# Patient Record
Sex: Male | Born: 1955
Health system: Southern US, Community
[De-identification: ages and names within clinical notes are randomized; demographics above are authoritative.]

## PROBLEM LIST (undated history)

## (undated) DIAGNOSIS — D649 Anemia, unspecified: Secondary | ICD-10-CM

## (undated) DIAGNOSIS — Z950 Presence of cardiac pacemaker: Secondary | ICD-10-CM

## (undated) DIAGNOSIS — E785 Hyperlipidemia, unspecified: Secondary | ICD-10-CM

## (undated) DIAGNOSIS — I10 Essential (primary) hypertension: Secondary | ICD-10-CM

## (undated) DIAGNOSIS — K5792 Diverticulitis of intestine, part unspecified, without perforation or abscess without bleeding: Secondary | ICD-10-CM

## (undated) DIAGNOSIS — I429 Cardiomyopathy, unspecified: Secondary | ICD-10-CM

## (undated) DIAGNOSIS — R21 Rash and other nonspecific skin eruption: Secondary | ICD-10-CM

## (undated) DIAGNOSIS — I480 Paroxysmal atrial fibrillation: Secondary | ICD-10-CM

## (undated) DIAGNOSIS — K219 Gastro-esophageal reflux disease without esophagitis: Secondary | ICD-10-CM

## (undated) DIAGNOSIS — G4733 Obstructive sleep apnea (adult) (pediatric): Secondary | ICD-10-CM

## (undated) DIAGNOSIS — G4700 Insomnia, unspecified: Secondary | ICD-10-CM

## (undated) DIAGNOSIS — Z95810 Presence of automatic (implantable) cardiac defibrillator: Secondary | ICD-10-CM

## (undated) DIAGNOSIS — E119 Type 2 diabetes mellitus without complications: Secondary | ICD-10-CM

## (undated) DIAGNOSIS — K5909 Other constipation: Secondary | ICD-10-CM

## (undated) DIAGNOSIS — M1990 Unspecified osteoarthritis, unspecified site: Secondary | ICD-10-CM

## (undated) DIAGNOSIS — IMO0001 Reserved for inherently not codable concepts without codable children: Secondary | ICD-10-CM

## (undated) DIAGNOSIS — J189 Pneumonia, unspecified organism: Secondary | ICD-10-CM

## (undated) DIAGNOSIS — J45909 Unspecified asthma, uncomplicated: Secondary | ICD-10-CM

## (undated) DIAGNOSIS — Z9981 Dependence on supplemental oxygen: Secondary | ICD-10-CM

## (undated) DIAGNOSIS — J439 Emphysema, unspecified: Secondary | ICD-10-CM

## (undated) DIAGNOSIS — F411 Generalized anxiety disorder: Secondary | ICD-10-CM

## (undated) DIAGNOSIS — R55 Syncope and collapse: Secondary | ICD-10-CM

## (undated) DIAGNOSIS — G629 Polyneuropathy, unspecified: Secondary | ICD-10-CM

## (undated) DIAGNOSIS — F329 Major depressive disorder, single episode, unspecified: Secondary | ICD-10-CM

## (undated) DIAGNOSIS — I499 Cardiac arrhythmia, unspecified: Secondary | ICD-10-CM

## (undated) DIAGNOSIS — H409 Unspecified glaucoma: Secondary | ICD-10-CM

## (undated) DIAGNOSIS — J449 Chronic obstructive pulmonary disease, unspecified: Secondary | ICD-10-CM

## (undated) DIAGNOSIS — F3289 Other specified depressive episodes: Secondary | ICD-10-CM

## (undated) DIAGNOSIS — K224 Dyskinesia of esophagus: Secondary | ICD-10-CM

## (undated) DIAGNOSIS — I219 Acute myocardial infarction, unspecified: Secondary | ICD-10-CM

## (undated) DIAGNOSIS — I509 Heart failure, unspecified: Secondary | ICD-10-CM

## (undated) DIAGNOSIS — I4891 Unspecified atrial fibrillation: Secondary | ICD-10-CM

## (undated) HISTORY — DX: Gastro-esophageal reflux disease without esophagitis: K21.9

## (undated) HISTORY — PX: TONSILLECTOMY AND ADENOIDECTOMY: SUR1326

## (undated) HISTORY — DX: Paroxysmal atrial fibrillation: I48.0

## (undated) HISTORY — DX: Other specified depressive episodes: F32.89

## (undated) HISTORY — DX: Cardiomyopathy, unspecified: I42.9

## (undated) HISTORY — DX: Obstructive sleep apnea (adult) (pediatric): G47.33

## (undated) HISTORY — DX: Rash and other nonspecific skin eruption: R21

## (undated) HISTORY — DX: Unspecified atrial fibrillation: I48.91

## (undated) HISTORY — DX: Dyskinesia of esophagus: K22.4

## (undated) HISTORY — DX: Syncope and collapse: R55

## (undated) HISTORY — DX: Type 2 diabetes mellitus without complications: E11.9

## (undated) HISTORY — DX: Essential (primary) hypertension: I10

## (undated) HISTORY — DX: Hyperlipidemia, unspecified: E78.5

## (undated) HISTORY — DX: Reserved for inherently not codable concepts without codable children: IMO0001

## (undated) HISTORY — PX: JOINT REPLACEMENT: SHX530

## (undated) HISTORY — DX: Major depressive disorder, single episode, unspecified: F32.9

## (undated) HISTORY — DX: Insomnia, unspecified: G47.00

## (undated) HISTORY — PX: ESOPHAGOGASTRODUODENOSCOPY: SHX1529

## (undated) HISTORY — DX: Generalized anxiety disorder: F41.1

## (undated) HISTORY — PX: ACNE CYST REMOVAL: SUR1112

## (undated) HISTORY — PX: COLONOSCOPY: SHX174

---

## 2000-10-26 ENCOUNTER — Inpatient Hospital Stay (HOSPITAL_COMMUNITY): Admission: EM | Admit: 2000-10-26 | Discharge: 2000-10-27 | Payer: Self-pay | Admitting: *Deleted

## 2000-10-26 ENCOUNTER — Encounter: Payer: Self-pay | Admitting: Emergency Medicine

## 2004-06-10 ENCOUNTER — Encounter: Admission: RE | Admit: 2004-06-10 | Discharge: 2004-06-10 | Payer: Self-pay | Admitting: Internal Medicine

## 2004-06-30 ENCOUNTER — Ambulatory Visit (HOSPITAL_COMMUNITY): Admission: RE | Admit: 2004-06-30 | Discharge: 2004-06-30 | Payer: Self-pay | Admitting: General Surgery

## 2004-06-30 ENCOUNTER — Encounter (INDEPENDENT_AMBULATORY_CARE_PROVIDER_SITE_OTHER): Payer: Self-pay | Admitting: Specialist

## 2004-06-30 ENCOUNTER — Ambulatory Visit (HOSPITAL_BASED_OUTPATIENT_CLINIC_OR_DEPARTMENT_OTHER): Admission: RE | Admit: 2004-06-30 | Discharge: 2004-06-30 | Payer: Self-pay | Admitting: General Surgery

## 2004-10-03 ENCOUNTER — Ambulatory Visit: Payer: Self-pay | Admitting: Internal Medicine

## 2005-04-10 ENCOUNTER — Ambulatory Visit: Payer: Self-pay | Admitting: Internal Medicine

## 2005-04-13 ENCOUNTER — Ambulatory Visit: Payer: Self-pay | Admitting: Internal Medicine

## 2005-04-20 ENCOUNTER — Ambulatory Visit: Payer: Self-pay | Admitting: Internal Medicine

## 2005-04-23 ENCOUNTER — Ambulatory Visit: Payer: Self-pay | Admitting: Cardiology

## 2005-04-24 ENCOUNTER — Ambulatory Visit: Payer: Self-pay | Admitting: Gastroenterology

## 2005-04-25 ENCOUNTER — Encounter (INDEPENDENT_AMBULATORY_CARE_PROVIDER_SITE_OTHER): Payer: Self-pay | Admitting: *Deleted

## 2005-04-25 ENCOUNTER — Ambulatory Visit: Payer: Self-pay | Admitting: Gastroenterology

## 2005-05-16 ENCOUNTER — Ambulatory Visit: Payer: Self-pay | Admitting: Gastroenterology

## 2005-11-13 ENCOUNTER — Ambulatory Visit: Payer: Self-pay | Admitting: Internal Medicine

## 2006-05-15 ENCOUNTER — Ambulatory Visit: Payer: Self-pay | Admitting: Internal Medicine

## 2006-05-15 LAB — CONVERTED CEMR LAB: PSA: 0.8 ng/mL

## 2006-06-04 ENCOUNTER — Ambulatory Visit: Payer: Self-pay | Admitting: Internal Medicine

## 2006-09-16 ENCOUNTER — Ambulatory Visit: Payer: Self-pay | Admitting: Internal Medicine

## 2006-12-18 ENCOUNTER — Ambulatory Visit: Payer: Self-pay | Admitting: Internal Medicine

## 2007-06-03 ENCOUNTER — Encounter: Payer: Self-pay | Admitting: Internal Medicine

## 2007-06-03 DIAGNOSIS — F32A Depression, unspecified: Secondary | ICD-10-CM | POA: Insufficient documentation

## 2007-06-03 DIAGNOSIS — M722 Plantar fascial fibromatosis: Secondary | ICD-10-CM | POA: Insufficient documentation

## 2007-06-03 DIAGNOSIS — I10 Essential (primary) hypertension: Secondary | ICD-10-CM | POA: Insufficient documentation

## 2007-06-03 DIAGNOSIS — E785 Hyperlipidemia, unspecified: Secondary | ICD-10-CM | POA: Insufficient documentation

## 2007-06-03 DIAGNOSIS — F329 Major depressive disorder, single episode, unspecified: Secondary | ICD-10-CM

## 2007-06-03 DIAGNOSIS — K224 Dyskinesia of esophagus: Secondary | ICD-10-CM | POA: Insufficient documentation

## 2007-06-03 DIAGNOSIS — G4700 Insomnia, unspecified: Secondary | ICD-10-CM | POA: Insufficient documentation

## 2007-06-09 DIAGNOSIS — M545 Low back pain, unspecified: Secondary | ICD-10-CM | POA: Insufficient documentation

## 2007-06-09 DIAGNOSIS — F419 Anxiety disorder, unspecified: Secondary | ICD-10-CM | POA: Insufficient documentation

## 2007-06-09 DIAGNOSIS — N400 Enlarged prostate without lower urinary tract symptoms: Secondary | ICD-10-CM | POA: Insufficient documentation

## 2007-06-09 DIAGNOSIS — K219 Gastro-esophageal reflux disease without esophagitis: Secondary | ICD-10-CM | POA: Insufficient documentation

## 2007-06-09 DIAGNOSIS — J309 Allergic rhinitis, unspecified: Secondary | ICD-10-CM | POA: Insufficient documentation

## 2007-06-09 DIAGNOSIS — E119 Type 2 diabetes mellitus without complications: Secondary | ICD-10-CM | POA: Insufficient documentation

## 2007-07-01 ENCOUNTER — Ambulatory Visit: Payer: Self-pay | Admitting: Internal Medicine

## 2007-07-01 LAB — CONVERTED CEMR LAB
Albumin: 3.9 g/dL (ref 3.5–5.2)
Alkaline Phosphatase: 59 units/L (ref 39–117)
Basophils Absolute: 0.1 10*3/uL (ref 0.0–0.1)
Bilirubin Urine: NEGATIVE
Direct LDL: 137.9 mg/dL
Eosinophils Absolute: 0.2 10*3/uL (ref 0.0–0.6)
HDL: 30.3 mg/dL — ABNORMAL LOW (ref >39.0)
Lymphocytes Relative: 35.5 % (ref 12.0–46.0)
MCHC: 34.8 g/dL (ref 30.0–36.0)
MCV: 91.9 fL (ref 78.0–100.0)
Monocytes Relative: 7.8 % (ref 3.0–11.0)
Neutro Abs: 5.2 10*3/uL (ref 1.4–7.7)
PSA: 0.65 ng/mL (ref 0.10–4.00)
Platelets: 213 10*3/uL (ref 150–400)
Pro B Natriuretic peptide (BNP): 43 pg/mL (ref 0.0–100.0)
Total CHOL/HDL Ratio: 7.2
Total Protein: 6.3 g/dL (ref 6.0–8.3)
Triglycerides: 363 mg/dL (ref 0–149)
Urine Glucose: NEGATIVE mg/dL

## 2007-07-11 ENCOUNTER — Ambulatory Visit: Payer: Self-pay | Admitting: Pulmonary Disease

## 2007-07-29 ENCOUNTER — Ambulatory Visit (HOSPITAL_BASED_OUTPATIENT_CLINIC_OR_DEPARTMENT_OTHER): Admission: RE | Admit: 2007-07-29 | Discharge: 2007-07-29 | Payer: Self-pay | Admitting: Pulmonary Disease

## 2007-07-29 ENCOUNTER — Encounter: Payer: Self-pay | Admitting: Pulmonary Disease

## 2007-08-05 ENCOUNTER — Ambulatory Visit: Payer: Self-pay | Admitting: Pulmonary Disease

## 2007-08-11 ENCOUNTER — Ambulatory Visit: Payer: Self-pay | Admitting: Pulmonary Disease

## 2007-08-26 ENCOUNTER — Ambulatory Visit: Payer: Self-pay | Admitting: Internal Medicine

## 2007-09-15 ENCOUNTER — Ambulatory Visit: Payer: Self-pay | Admitting: Pulmonary Disease

## 2007-10-17 ENCOUNTER — Encounter: Payer: Self-pay | Admitting: Pulmonary Disease

## 2007-10-21 ENCOUNTER — Ambulatory Visit: Payer: Self-pay | Admitting: Internal Medicine

## 2007-10-21 DIAGNOSIS — M2550 Pain in unspecified joint: Secondary | ICD-10-CM | POA: Insufficient documentation

## 2007-10-21 DIAGNOSIS — J45909 Unspecified asthma, uncomplicated: Secondary | ICD-10-CM | POA: Insufficient documentation

## 2007-10-21 DIAGNOSIS — G4733 Obstructive sleep apnea (adult) (pediatric): Secondary | ICD-10-CM | POA: Insufficient documentation

## 2007-10-21 DIAGNOSIS — R609 Edema, unspecified: Secondary | ICD-10-CM | POA: Insufficient documentation

## 2007-10-21 DIAGNOSIS — Z87442 Personal history of urinary calculi: Secondary | ICD-10-CM | POA: Insufficient documentation

## 2007-10-24 ENCOUNTER — Encounter: Payer: Self-pay | Admitting: Pulmonary Disease

## 2007-10-27 ENCOUNTER — Telehealth (INDEPENDENT_AMBULATORY_CARE_PROVIDER_SITE_OTHER): Payer: Self-pay | Admitting: *Deleted

## 2007-10-31 ENCOUNTER — Encounter: Payer: Self-pay | Admitting: Internal Medicine

## 2008-01-28 ENCOUNTER — Telehealth: Payer: Self-pay | Admitting: Internal Medicine

## 2008-03-01 ENCOUNTER — Telehealth: Payer: Self-pay | Admitting: Internal Medicine

## 2008-03-11 ENCOUNTER — Ambulatory Visit: Payer: Self-pay | Admitting: Internal Medicine

## 2008-03-11 ENCOUNTER — Telehealth (INDEPENDENT_AMBULATORY_CARE_PROVIDER_SITE_OTHER): Payer: Self-pay | Admitting: *Deleted

## 2008-03-11 DIAGNOSIS — IMO0001 Reserved for inherently not codable concepts without codable children: Secondary | ICD-10-CM | POA: Insufficient documentation

## 2008-03-24 ENCOUNTER — Encounter: Payer: Self-pay | Admitting: Internal Medicine

## 2008-03-31 ENCOUNTER — Ambulatory Visit: Payer: Self-pay | Admitting: Internal Medicine

## 2008-04-30 ENCOUNTER — Encounter: Payer: Self-pay | Admitting: Internal Medicine

## 2008-05-03 ENCOUNTER — Encounter: Payer: Self-pay | Admitting: Internal Medicine

## 2008-05-20 ENCOUNTER — Ambulatory Visit: Payer: Self-pay | Admitting: Gastroenterology

## 2008-05-21 ENCOUNTER — Telehealth: Payer: Self-pay | Admitting: Gastroenterology

## 2008-05-27 ENCOUNTER — Telehealth: Payer: Self-pay | Admitting: Internal Medicine

## 2008-05-31 ENCOUNTER — Telehealth (INDEPENDENT_AMBULATORY_CARE_PROVIDER_SITE_OTHER): Payer: Self-pay | Admitting: *Deleted

## 2008-06-01 ENCOUNTER — Ambulatory Visit: Payer: Self-pay | Admitting: Gastroenterology

## 2008-06-01 ENCOUNTER — Encounter: Payer: Self-pay | Admitting: Gastroenterology

## 2008-06-03 ENCOUNTER — Encounter: Payer: Self-pay | Admitting: Gastroenterology

## 2008-06-10 ENCOUNTER — Encounter: Payer: Self-pay | Admitting: Internal Medicine

## 2008-06-28 ENCOUNTER — Ambulatory Visit: Payer: Self-pay | Admitting: Internal Medicine

## 2008-06-28 DIAGNOSIS — R350 Frequency of micturition: Secondary | ICD-10-CM | POA: Insufficient documentation

## 2008-06-28 LAB — CONVERTED CEMR LAB
Bilirubin Urine: NEGATIVE
Blood Glucose, Fingerstick: 77
Ketones, ur: NEGATIVE mg/dL
Leukocytes, UA: NEGATIVE
Mucus, UA: NEGATIVE
pH: 6 (ref 5.0–8.0)

## 2008-06-29 ENCOUNTER — Encounter: Payer: Self-pay | Admitting: Internal Medicine

## 2008-07-08 ENCOUNTER — Telehealth (INDEPENDENT_AMBULATORY_CARE_PROVIDER_SITE_OTHER): Payer: Self-pay | Admitting: *Deleted

## 2008-07-27 ENCOUNTER — Ambulatory Visit: Payer: Self-pay | Admitting: Internal Medicine

## 2008-07-29 ENCOUNTER — Telehealth (INDEPENDENT_AMBULATORY_CARE_PROVIDER_SITE_OTHER): Payer: Self-pay | Admitting: *Deleted

## 2008-08-20 ENCOUNTER — Telehealth: Payer: Self-pay | Admitting: Internal Medicine

## 2008-09-07 ENCOUNTER — Telehealth (INDEPENDENT_AMBULATORY_CARE_PROVIDER_SITE_OTHER): Payer: Self-pay | Admitting: *Deleted

## 2008-10-22 ENCOUNTER — Telehealth (INDEPENDENT_AMBULATORY_CARE_PROVIDER_SITE_OTHER): Payer: Self-pay | Admitting: *Deleted

## 2008-10-26 ENCOUNTER — Telehealth: Payer: Self-pay | Admitting: Internal Medicine

## 2008-11-12 ENCOUNTER — Encounter: Payer: Self-pay | Admitting: Internal Medicine

## 2008-11-18 ENCOUNTER — Ambulatory Visit: Payer: Self-pay | Admitting: Internal Medicine

## 2008-11-18 DIAGNOSIS — N453 Epididymo-orchitis: Secondary | ICD-10-CM | POA: Insufficient documentation

## 2008-11-22 ENCOUNTER — Telehealth (INDEPENDENT_AMBULATORY_CARE_PROVIDER_SITE_OTHER): Payer: Self-pay | Admitting: *Deleted

## 2008-12-13 ENCOUNTER — Telehealth: Payer: Self-pay | Admitting: Internal Medicine

## 2008-12-30 ENCOUNTER — Telehealth (INDEPENDENT_AMBULATORY_CARE_PROVIDER_SITE_OTHER): Payer: Self-pay | Admitting: *Deleted

## 2009-02-01 ENCOUNTER — Telehealth: Payer: Self-pay | Admitting: Internal Medicine

## 2009-03-08 ENCOUNTER — Telehealth (INDEPENDENT_AMBULATORY_CARE_PROVIDER_SITE_OTHER): Payer: Self-pay | Admitting: *Deleted

## 2009-03-15 ENCOUNTER — Telehealth: Payer: Self-pay | Admitting: Internal Medicine

## 2009-03-30 ENCOUNTER — Telehealth: Payer: Self-pay | Admitting: Internal Medicine

## 2009-04-04 ENCOUNTER — Telehealth (INDEPENDENT_AMBULATORY_CARE_PROVIDER_SITE_OTHER): Payer: Self-pay | Admitting: *Deleted

## 2009-04-05 ENCOUNTER — Telehealth: Payer: Self-pay | Admitting: Internal Medicine

## 2009-04-12 ENCOUNTER — Ambulatory Visit: Payer: Self-pay | Admitting: Internal Medicine

## 2009-04-12 ENCOUNTER — Telehealth: Payer: Self-pay | Admitting: Internal Medicine

## 2009-04-12 DIAGNOSIS — R21 Rash and other nonspecific skin eruption: Secondary | ICD-10-CM

## 2009-04-12 HISTORY — DX: Rash and other nonspecific skin eruption: R21

## 2009-04-13 LAB — CONVERTED CEMR LAB
CO2: 32 meq/L (ref 19–32)
Calcium: 9.1 mg/dL (ref 8.4–10.5)
Cholesterol: 228 mg/dL — ABNORMAL HIGH (ref 0–200)
Creatinine, Ser: 0.8 mg/dL (ref 0.4–1.5)
GFR calc non Af Amer: 106.94 mL/min (ref >60)
HDL: 36.1 mg/dL — ABNORMAL LOW (ref >39.00)
Triglycerides: 175 mg/dL — ABNORMAL HIGH (ref 0.0–149.0)

## 2009-04-19 ENCOUNTER — Telehealth: Payer: Self-pay | Admitting: Internal Medicine

## 2009-04-21 ENCOUNTER — Telehealth: Payer: Self-pay | Admitting: Internal Medicine

## 2009-04-28 ENCOUNTER — Telehealth: Payer: Self-pay | Admitting: Internal Medicine

## 2009-05-31 ENCOUNTER — Telehealth: Payer: Self-pay | Admitting: Internal Medicine

## 2009-06-06 ENCOUNTER — Telehealth (INDEPENDENT_AMBULATORY_CARE_PROVIDER_SITE_OTHER): Payer: Self-pay | Admitting: *Deleted

## 2009-06-13 ENCOUNTER — Telehealth: Payer: Self-pay | Admitting: Internal Medicine

## 2009-06-21 ENCOUNTER — Telehealth: Payer: Self-pay | Admitting: Internal Medicine

## 2009-07-06 ENCOUNTER — Telehealth: Payer: Self-pay | Admitting: Internal Medicine

## 2009-07-25 ENCOUNTER — Telehealth: Payer: Self-pay | Admitting: Internal Medicine

## 2009-07-26 ENCOUNTER — Ambulatory Visit: Payer: Self-pay | Admitting: Internal Medicine

## 2009-08-03 ENCOUNTER — Telehealth: Payer: Self-pay | Admitting: Internal Medicine

## 2009-08-10 ENCOUNTER — Ambulatory Visit: Payer: Self-pay | Admitting: Internal Medicine

## 2009-08-12 ENCOUNTER — Encounter: Payer: Self-pay | Admitting: Internal Medicine

## 2009-08-31 ENCOUNTER — Telehealth: Payer: Self-pay | Admitting: Internal Medicine

## 2009-09-14 ENCOUNTER — Telehealth: Payer: Self-pay | Admitting: Internal Medicine

## 2009-09-15 ENCOUNTER — Telehealth: Payer: Self-pay | Admitting: Internal Medicine

## 2009-09-16 ENCOUNTER — Telehealth: Payer: Self-pay | Admitting: Internal Medicine

## 2009-09-30 ENCOUNTER — Telehealth: Payer: Self-pay | Admitting: Internal Medicine

## 2009-10-03 ENCOUNTER — Telehealth: Payer: Self-pay | Admitting: Internal Medicine

## 2009-10-18 ENCOUNTER — Encounter: Payer: Self-pay | Admitting: Internal Medicine

## 2009-10-18 ENCOUNTER — Ambulatory Visit: Payer: Self-pay | Admitting: Internal Medicine

## 2009-12-07 ENCOUNTER — Telehealth: Payer: Self-pay | Admitting: Internal Medicine

## 2009-12-12 ENCOUNTER — Telehealth: Payer: Self-pay | Admitting: Internal Medicine

## 2009-12-26 ENCOUNTER — Telehealth: Payer: Self-pay | Admitting: Internal Medicine

## 2010-02-21 ENCOUNTER — Ambulatory Visit: Payer: Self-pay | Admitting: Internal Medicine

## 2010-02-24 ENCOUNTER — Telehealth: Payer: Self-pay | Admitting: Internal Medicine

## 2010-03-09 ENCOUNTER — Telehealth: Payer: Self-pay | Admitting: Internal Medicine

## 2010-03-21 ENCOUNTER — Ambulatory Visit: Payer: Self-pay | Admitting: Internal Medicine

## 2010-03-21 LAB — CONVERTED CEMR LAB
AST: 19 units/L (ref 0–37)
Alkaline Phosphatase: 63 units/L (ref 39–117)
BUN: 11 mg/dL (ref 6–23)
Basophils Absolute: 0 10*3/uL (ref 0.0–0.1)
Bilirubin, Direct: 0.1 mg/dL (ref 0.0–0.3)
CO2: 29 meq/L (ref 19–32)
Chloride: 107 meq/L (ref 96–112)
Cholesterol: 238 mg/dL — ABNORMAL HIGH (ref 0–200)
Creatinine, Ser: 0.7 mg/dL (ref 0.4–1.5)
Creatinine,U: 97 mg/dL
Direct LDL: 164.8 mg/dL
Eosinophils Relative: 2.4 % (ref 0.0–5.0)
Glucose, Bld: 118 mg/dL — ABNORMAL HIGH (ref 70–99)
HCT: 42.6 % (ref 39.0–52.0)
Hemoglobin: 14.7 g/dL (ref 13.0–17.0)
Hgb A1c MFr Bld: 6.7 % — ABNORMAL HIGH (ref 4.6–6.5)
Ketones, ur: NEGATIVE mg/dL
Lymphocytes Relative: 30.9 % (ref 12.0–46.0)
Microalb, Ur: 0.7 mg/dL (ref 0.0–1.9)
Monocytes Relative: 8 % (ref 3.0–12.0)
Neutro Abs: 4.2 10*3/uL (ref 1.4–7.7)
Potassium: 4.6 meq/L (ref 3.5–5.1)
RBC: 4.56 M/uL (ref 4.22–5.81)
RDW: 12.6 % (ref 11.5–14.6)
Specific Gravity, Urine: 1.02 (ref 1.000–1.030)
TSH: 3.01 microintl units/mL (ref 0.35–5.50)
Total CHOL/HDL Ratio: 6
Total Protein, Urine: NEGATIVE mg/dL
Total Protein: 6.5 g/dL (ref 6.0–8.3)
Urine Glucose: NEGATIVE mg/dL
VLDL: 34.8 mg/dL (ref 0.0–40.0)
WBC: 7.2 10*3/uL (ref 4.5–10.5)

## 2010-03-29 ENCOUNTER — Ambulatory Visit: Payer: Self-pay | Admitting: Internal Medicine

## 2010-04-12 ENCOUNTER — Telehealth: Payer: Self-pay | Admitting: Internal Medicine

## 2010-05-15 ENCOUNTER — Telehealth: Payer: Self-pay | Admitting: Internal Medicine

## 2010-05-18 ENCOUNTER — Ambulatory Visit: Payer: Self-pay | Admitting: Internal Medicine

## 2010-05-18 DIAGNOSIS — G894 Chronic pain syndrome: Secondary | ICD-10-CM | POA: Insufficient documentation

## 2010-05-18 DIAGNOSIS — M25519 Pain in unspecified shoulder: Secondary | ICD-10-CM | POA: Insufficient documentation

## 2010-05-19 ENCOUNTER — Telehealth: Payer: Self-pay | Admitting: Internal Medicine

## 2010-07-07 ENCOUNTER — Ambulatory Visit: Payer: Self-pay | Admitting: Internal Medicine

## 2010-09-18 ENCOUNTER — Telehealth: Payer: Self-pay | Admitting: Internal Medicine

## 2010-09-20 ENCOUNTER — Telehealth: Payer: Self-pay | Admitting: Internal Medicine

## 2010-09-21 ENCOUNTER — Ambulatory Visit: Payer: Self-pay | Admitting: Internal Medicine

## 2010-09-26 LAB — CONVERTED CEMR LAB
BUN: 14 mg/dL (ref 6–23)
Chloride: 100 meq/L (ref 96–112)
Cholesterol: 214 mg/dL — ABNORMAL HIGH (ref 0–200)
Glucose, Bld: 84 mg/dL (ref 70–99)
HDL: 38.8 mg/dL — ABNORMAL LOW (ref >39.00)
Potassium: 4.5 meq/L (ref 3.5–5.1)
Total CHOL/HDL Ratio: 6
VLDL: 26.2 mg/dL (ref 0.0–40.0)

## 2010-09-28 ENCOUNTER — Ambulatory Visit: Payer: Self-pay | Admitting: Internal Medicine

## 2010-10-27 ENCOUNTER — Ambulatory Visit
Admission: RE | Admit: 2010-10-27 | Discharge: 2010-10-27 | Payer: Self-pay | Source: Home / Self Care | Attending: Internal Medicine | Admitting: Internal Medicine

## 2010-10-27 ENCOUNTER — Other Ambulatory Visit: Payer: Self-pay | Admitting: Internal Medicine

## 2010-10-27 LAB — LIPID PANEL
Cholesterol: 148 mg/dL (ref 0–200)
HDL: 37.5 mg/dL — ABNORMAL LOW (ref >39.00)
LDL Cholesterol: 97 mg/dL (ref 0–99)
Total CHOL/HDL Ratio: 4
Triglycerides: 69 mg/dL (ref 0.0–149.0)
VLDL: 13.8 mg/dL (ref 0.0–40.0)

## 2010-10-27 LAB — HEPATIC FUNCTION PANEL
ALT: 32 U/L (ref 0–53)
AST: 25 U/L (ref 0–37)
Albumin: 4.1 g/dL (ref 3.5–5.2)
Alkaline Phosphatase: 64 U/L (ref 39–117)
Bilirubin, Direct: 0.1 mg/dL (ref 0.0–0.3)
Total Bilirubin: 0.4 mg/dL (ref 0.3–1.2)
Total Protein: 6.6 g/dL (ref 6.0–8.3)

## 2010-11-01 ENCOUNTER — Ambulatory Visit
Admission: RE | Admit: 2010-11-01 | Discharge: 2010-11-01 | Payer: Self-pay | Source: Home / Self Care | Attending: Internal Medicine | Admitting: Internal Medicine

## 2010-11-16 ENCOUNTER — Telehealth (INDEPENDENT_AMBULATORY_CARE_PROVIDER_SITE_OTHER): Payer: Self-pay | Admitting: *Deleted

## 2010-11-16 NOTE — Progress Notes (Signed)
  Phone Note Refill Request Message from:  Fax from Pharmacy on April 12, 2010 1:00 PM  Refills Requested: Medication #1:  VERAMYST 27.5 MCG/SPRAY SUSP Spray 2 spray into both nostrils every morning   Dosage confirmed as above?Dosage Confirmed   Notes: [facility] Department Initial call taken by: Zella Ball Ewing CMA Duncan Dull),  April 12, 2010 1:01 PM    Prescriptions: VERAMYST 27.5 MCG/SPRAY SUSP (FLUTICASONE FUROATE) Spray 2 spray into both nostrils every morning  #1 x 11   Entered by:   Zella Ball Ewing CMA (AAMA)   Authorized by:   [name] MD   Signed by:   Scharlene Gloss CMA (AAMA) on 04/12/2010   Method used:   Faxed to ...       [facility] Department (retail)       [address] [place], [place]  [address]       Ph: [phone]       Fax: [phone]   RxID:   [id number]

## 2010-11-16 NOTE — Progress Notes (Signed)
  Phone Note Refill Request Message from:  Fax from Pharmacy on Feb 24, 2010 4:22 PM  Refills Requested: Medication #1:  XYZAL 5 MG  TABS 1 by mouth once daily   Dosage confirmed as above?Dosage Confirmed   Supply Requested: 3 months   Last Refilled: 12/09/2009 Initial call taken by: Rock Nephew CMA,  Feb 24, 2010 4:22 PM    Prescriptions: XYZAL 5 MG  TABS (LEVOCETIRIZINE DIHYDROCHLORIDE) 1 by mouth once daily  #90 x 3   Entered by:   Rock Nephew CMA   Authorized by:   [name] MD   Signed by:   Rock Nephew CMA on 02/24/2010   Method used:   Faxed to ...       [facility] Department (retail)       [address] [place], [place]  [address]       Ph: [phone]       Fax: [phone]   RxID:   [id number]

## 2010-11-16 NOTE — Progress Notes (Signed)
Summary: Rx refill req  Phone Note Refill Request Message from:  Patient on September 20, 2010 4:38 PM  Refills Requested: Medication #1:  AMBIEN 10 MG  TABS 1 by mouth at bedtime as needed   Dosage confirmed as above?Dosage Confirmed   Supply Requested: 3 months  Method Requested: Electronic Initial call taken by: Margaret Pyle, CMA,  September 20, 2010 4:38 PM    Prescriptions: AMBIEN 10 MG  TABS (ZOLPIDEM TARTRATE) 1 by mouth at bedtime as needed  #90 x 1   Entered and Authorized by:   [name] MD   Signed by:   [name] MD on 09/20/2010   Method used:   Print then Give to Patient   RxID:   [id number]  done hardcopy to LIM side B - dahlia [name] MD  September 20, 2010 4:50 PM   Rx faxed to pharmacy Margaret Pyle, CMA  September 21, 2010 8:03 AM

## 2010-11-16 NOTE — Progress Notes (Signed)
Summary: ALT med?  Phone Note Call from Patient Call back at Home Phone [phone]   Caller: Patient Call For: Dr Jonny Ruiz Summary of Call: Pt called and cannot afford any of his prescriptions, $80, so he did not pick them up. Can these all be changed to differants brands that may be cheaper, also he said Dr Jonny Ruiz did 3 referrals and he cannot afford to do those appointments either. Initial call taken by: Verdell Face,  May 19, 2010 8:37 AM  Follow-up for Phone Call        Pt states that Medicaid sent him a family planning Insurance card and this will not pay on Rxs recived yesterday. Pt medications will be $80.00 each and he cannot afford this. Pt is requesting, if possible, for MD to give him a stronger pain pill and to hold off on referral until he can get situation fix with Social Services. If not pt states he will have to go to ER due to the severity of his pain. Please advise. Follow-up by: Margaret Pyle, CMA,  May 19, 2010 9:16 AM  Additional Follow-up for Phone Call Additional follow up Details #1::        unfort there are no other less expensive meds to offer;    we can try to transfer his care to [facility]?  I do not feel comfortable with serving as his pain management physician; I can only suggest he go to the pain clinic as referred Additional Follow-up by: [name] MD,  May 19, 2010 1:22 PM    Additional Follow-up for Phone Call Additional follow up Details #2::    I called pt and informed of MD advisement, pt states that he is very comfortable with [facility] as his PCP and does not want to transfer care. Pt will hold on Rxs until Medicaid situation is fixed with social services and if pain become too much he will go to ER as needed. Pt is requesting all referrals made at [facility] be cancelled. Follow-up by: Margaret Pyle, CMA,  May 19, 2010 2:48 PM  Additional Follow-up for Phone Call Additional follow up Details #3:: Details for Additional Follow-up  Action Taken: Please inform pt I canot serve as his pain specialist, and he should not expect further med refills of controlled meds from here Additional Follow-up by: [name] MD,  May 19, 2010 2:57 PM   Pt informed and expressed understanding. Margaret Pyle, CMA  May 19, 2010 3:46 PM

## 2010-11-16 NOTE — Progress Notes (Signed)
Summary: medication clarification  Phone Note From Pharmacy   Caller: [facility] Department Summary of Call: Pharmacy called requesting clarification on Lidoderm 5% ptch directions, 1 patch three times a day, they state usually only wear for 12 hours then 12 off. Initial call taken by: Scharlene Gloss,  Mar 09, 2010 9:52 AM  Follow-up for Phone Call        ok for two times a day as needed  Follow-up by: [name] MD,  Mar 09, 2010 3:32 PM    New/Updated Medications: LIDODERM 5 % PTCH (LIDOCAINE) 1 patch two times a day as needed Prescriptions: LIDODERM 5 % PTCH (LIDOCAINE) 1 patch two times a day as needed  #1 box x 5   Entered by:   Scharlene Gloss   Authorized by:   [name] MD   Signed by:   Scharlene Gloss on 03/09/2010   Method used:   Faxed to ...       [facility] Department (retail)       [address] [place], [place]  [address]       Ph: [phone]       Fax: [phone]   RxID:   [id number]

## 2010-11-16 NOTE — Assessment & Plan Note (Signed)
Summary: med refill--per pt---stc   Vital Signs:  Patient profile:   55 year old male Height:      70 inches Weight:      326.50 pounds BMI:     47.02 O2 Sat:      93 % on Room air Temp:     97.2 degrees F oral Pulse rate:   79 / minute BP sitting:   120 / 80  (left arm) Cuff size:   large  Vitals Entered ByZella Ball Ewing (Feb 21, 2010 1:30 PM)  O2 Flow:  Room air CC: medication Refill/RE   CC:  medication Refill/RE.  History of Present Illness: no longer sees pain clinic due to cost;  on disability for almost 2 yrs, declared bankruptcy 2007 with too much competition from local painters;  has [employer] starting june 1 and plans to return then, but here today specifically for his chronic LBP;  has ongoing pain approx 5/10; no worsening LE pain, weakness, numbness, bowel or bladder change, fever, night sweats or unsual wt loss.  No worsening gait problem or falls or injury.  Pt denies CP, sob, doe, wheezing, orthopnea, pnd, worsening LE edema, palps, dizziness or syncope  Pt denies new neuro symptoms such as headache, facial or extremity weakness   Pt denies polydipsia, polyuria, or low sugar symptoms such as shakiness improved with eating.  Overall good compliance with meds, trying to follow low chol, DM diet, wt stable, little excercise however   Problems Prior to Update: 1)  Rash-nonvesicular  (ICD-782.1) 2)  Orchitis  (ICD-604.90) 3)  Frequency, Urinary  (ICD-788.41) 4)  Fibromyalgia  (ICD-729.1) 5)  Asthma  (ICD-493.90) 6)  Nephrolithiasis, Hx of  (ICD-V13.01) 7)  Polyarthralgia  (ICD-719.49) 8)  Peripheral Edema  (ICD-782.3) 9)  Sleep Apnea, Obstructive  (ICD-327.23) 10)  Dyskinesia, Esophagus  (ICD-530.5) 11)  Benign Prostatic Hypertrophy  (ICD-600.00) 12)  Diabetes Mellitus, Type II  (ICD-250.00) 13)  Low Back Pain  (ICD-724.2) 14)  Gerd  (ICD-530.81) 15)  Anxiety  (ICD-300.00) 16)  Allergic Rhinitis  (ICD-477.9) 17)  Insomnia  (ICD-780.52) 18)  Plantar  Fasciitis, Bilateral  (ICD-728.71) 19)  Morbid Obesity  (ICD-278.01) 20)  Obsessive-compulsive Disorder  (ICD-300.3) 21)  Hypertension  (ICD-401.9) 22)  Hyperlipidemia  (ICD-272.4) 23)  Depression  (ICD-311)  Medications Prior to Update: 1)  Advair Diskus 250-50 Mcg/dose Misc (Fluticasone-Salmeterol) .... Inhale 1 Puff As Directed Twice A Day 2)  Ambien 10 Mg  Tabs (Zolpidem Tartrate) .[name].. 1 By Mouth At Bedtime As Needed 3)  Soma 350 Mg  Tabs (Carisoprodol) .[name].. 1 By Mouth Three Times A Day As Needed 4)  Doxazosin Mesylate 1 Mg  Tabs (Doxazosin Mesylate) .[name].. 1po At Bedtime 5)  Proventil Hfa 108 (90 Base) Mcg/act Aers (Albuterol Sulfate) .... 2 Puffs Qid Prn 6)  Veramyst 27.5 Mcg/spray Susp (Fluticasone Furoate) .... Spray 2 Spray Into Both Nostrils Every Morning 7)  Ibuprofen 200 Mg  Tabs (Ibuprofen) .... 2 By Mouth Qid Prn 8)  Hydrocodone-Acetaminophen 10-325 Mg  Tabs (Hydrocodone-Acetaminophen) .[name].. 1 By Mouth Two Times A Day As Needed - To Fill Dec 26, 2009 9)  Ecotrin Low Strength 81 Mg  Tbec (Aspirin) .[name].. 1po Qd 10)  Hydrochlorothiazide 25 Mg Tabs (Hydrochlorothiazide) .... Take One (1) By Mouth Daily 11)  Xyzal 5 Mg  Tabs (Levocetirizine Dihydrochloride) .[name].. 1 By Mouth Once Daily 12)  Nuvigil 150 Mg  Tabs (Armodafinil) .[name].. 1 By Mouth Once Daily 13)  Crestor 20 Mg  Tabs (Rosuvastatin  Calcium) .[name].. 1 By Mouth Once Daily 14)  Sertraline Hcl 100 Mg  Tabs (Sertraline Hcl) .... 2 By Mouth Once Daily 15)  Clonazepam 2 Mg Tabs (Clonazepam) .[name].. 1 By Mouth At Bedtime As Needed 16)  Trazodone Hcl 100 Mg Tabs (Trazodone Hcl) .... 1/2 By Mouth At Bedtime As Needed 17)  Doxycycline Monohydrate 100 Mg Tabs (Doxycycline Monohydrate) .[name].. 1 By Mouth Two Times A Day 18)  Lipitor 40 Mg Tabs (Atorvastatin Calcium) .[name].. 1 By Mouth Once Daily 19)  Omeprazole 20 Mg Tbec (Omeprazole) .[name].. 1 By Mouth Two Times A Day 20)  Nexium 40 Mg Cpdr (Esomeprazole Magnesium) .[name].. 1 Po Once Daily 21)  Accupril 10  Mg Tabs (Quinapril Hcl) .[name].. 1 By Mouth  Current Medications (verified): 1)  Advair Diskus 250-50 Mcg/dose Misc (Fluticasone-Salmeterol) .... Inhale 1 Puff As Directed Twice A Day 2)  Ambien 10 Mg  Tabs (Zolpidem Tartrate) .[name].. 1 By Mouth At Bedtime As Needed 3)  Soma 350 Mg  Tabs (Carisoprodol) .[name].. 1 By Mouth Three Times A Day As Needed 4)  Doxazosin Mesylate 1 Mg  Tabs (Doxazosin Mesylate) .[name].. 1po At Bedtime 5)  Proventil Hfa 108 (90 Base) Mcg/act Aers (Albuterol Sulfate) .... 2 Puffs Qid Prn 6)  Veramyst 27.5 Mcg/spray Susp (Fluticasone Furoate) .... Spray 2 Spray Into Both Nostrils Every Morning 7)  Ibuprofen 200 Mg  Tabs (Ibuprofen) .... 2 By Mouth Qid Prn 8)  Hydrocodone-Acetaminophen 10-325 Mg  Tabs (Hydrocodone-Acetaminophen) .[name].. 1 By Mouth Two Times A Day As Needed - To Fill Dec 26, 2009 9)  Ecotrin Low Strength 81 Mg  Tbec (Aspirin) .[name].. 1po Qd 10)  Hydrochlorothiazide 25 Mg Tabs (Hydrochlorothiazide) .... Take One (1) By Mouth Daily 11)  Xyzal 5 Mg  Tabs (Levocetirizine Dihydrochloride) .[name].. 1 By Mouth Once Daily 12)  Nuvigil 150 Mg  Tabs (Armodafinil) .[name].. 1 By Mouth Once Daily 13)  Crestor 20 Mg  Tabs (Rosuvastatin Calcium) .[name].. 1 By Mouth Once Daily 14)  Sertraline Hcl 100 Mg  Tabs (Sertraline Hcl) .... 2 By Mouth Once Daily 15)  Clonazepam 2 Mg Tabs (Clonazepam) .[name].. 1 By Mouth At Bedtime As Needed 16)  Trazodone Hcl 100 Mg Tabs (Trazodone Hcl) .... 1/2 By Mouth At Bedtime As Needed 17)  Doxycycline Monohydrate 100 Mg Tabs (Doxycycline Monohydrate) .[name].. 1 By Mouth Two Times A Day 18)  Lipitor 40 Mg Tabs (Atorvastatin Calcium) .[name].. 1 By Mouth Once Daily 19)  Omeprazole 20 Mg Tbec (Omeprazole) .[name].. 1 By Mouth Two Times A Day 20)  Nexium 40 Mg Cpdr (Esomeprazole Magnesium) .[name].. 1 Po Once Daily 21)  Accupril 10 Mg Tabs (Quinapril Hcl) .[name].. 1 By Mouth 22)  Lidoderm 5 % Ptch (Lidocaine) .[name].. 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   Head:  normocephalic and atraumatic.   Eyes:  vision grossly intact, pupils equal, and  pupils round.   Ears:  R ear normal and L ear normal.   Nose:  no external deformity and no nasal discharge.   Mouth:  no gingival abnormalities and pharynx pink and moist.   Neck:  supple and no masses.   Lungs:  normal respiratory effort and normal breath sounds.   Heart:  normal rate and regular rhythm.   Msk:  no spine tender or paravertebral tender Extremities:  no edema, no erythema  Neurologic:  cranial nerves II-XII intact, strength normal in all extremities, and gait normal.     Impression & Recommendations:  Problem # 1:  LOW BACK PAIN (ICD-724.2)  His updated medication list for this problem includes:    Soma 350 Mg Tabs (Carisoprodol) .[name]... 1 by mouth three times a day as needed    Ibuprofen 200 Mg Tabs (Ibuprofen) .[name]... 2 by mouth qid prn    Hydrocodone-acetaminophen 10-325 Mg Tabs (Hydrocodone-acetaminophen) .[name]... 1 by mouth two times a day as needed - to fill Dec 26, 2009    Ecotrin Low Strength 81 Mg Tbec (Aspirin) .[name]... 1po qd chornic - for lidoderm today, Continue all previous medications as before this visit   Problem # 2:  HYPERTENSION (ICD-401.9)  His updated medication  list for this problem includes:    Doxazosin Mesylate 1 Mg Tabs (Doxazosin mesylate) .[name]... 1po at bedtime    Hydrochlorothiazide 25 Mg Tabs (Hydrochlorothiazide) .[name]... Take one (1) by mouth daily    Accupril 10 Mg Tabs (Quinapril hcl) .[name]... 1 by mouth  BP today: 120/80 Prior BP: 140/70 (08/10/2009)  Labs Reviewed: K+: 4.2 (04/12/2009) Creat: : 0.8 (04/12/2009)   Chol: 228 (04/12/2009)   HDL: 36.10 (04/12/2009)   LDL: DEL (07/01/2007)   TG: 175.0 (04/12/2009) stable overall by hx and exam, ok to continue meds/tx as is   Problem # 3:  DIABETES MELLITUS, TYPE II (ICD-250.00)  His updated medication list for this problem includes:    Ecotrin Low Strength 81 Mg Tbec (Aspirin) .[name]... 1po qd    Accupril 10 Mg Tabs (Quinapril hcl) .[name]... 1 by mouth  Labs Reviewed: Creat: 0.8 (04/12/2009)    Reviewed HgBA1c results: 7.0 (04/12/2009)  5.9 (07/01/2007) stable overall by hx and exam, ok to continue meds/tx as is ; declines labs today; defers until insurance starts after june 1  Complete Medication List: 1)  Advair Diskus 250-50 Mcg/dose Misc (Fluticasone-salmeterol) .... Inhale 1 puff as directed twice a day 2)  Ambien 10 Mg Tabs (Zolpidem tartrate) .[name].. 1 by mouth at bedtime as needed 3)  Soma 350 Mg Tabs (Carisoprodol) .[name].. 1 by mouth three times a day as needed 4)  Doxazosin Mesylate 1 Mg Tabs (Doxazosin mesylate) .[name].. 1po at bedtime 5)  Proventil Hfa 108 (90 Base) Mcg/act Aers (Albuterol sulfate) .... 2 puffs qid prn 6)  Veramyst 27.5 Mcg/spray Susp (Fluticasone furoate) .... Spray 2 spray into both nostrils every morning 7)  Ibuprofen 200 Mg Tabs (Ibuprofen) .... 2 by mouth qid prn 8)  Hydrocodone-acetaminophen 10-325 Mg Tabs (Hydrocodone-acetaminophen) .[name].. 1 by mouth two times a day as needed - to fill Dec 26, 2009 9)  Ecotrin Low Strength 81 Mg Tbec (Aspirin) .[name].. 1po qd 10)  Hydrochlorothiazide 25 Mg Tabs (Hydrochlorothiazide) .... Take one (1) by mouth daily 11)  Xyzal 5 Mg Tabs  (Levocetirizine dihydrochloride) .[name].. 1 by mouth once daily 12)  Nuvigil 150 Mg Tabs (Armodafinil) .[name].. 1 by mouth once daily 13)  Crestor 20 Mg Tabs (Rosuvastatin calcium) .[name].. 1 by mouth  once daily 14)  Sertraline Hcl 100 Mg Tabs (Sertraline hcl) .... 2 by mouth once daily 15)  Clonazepam 2 Mg Tabs (Clonazepam) .[name].. 1 by mouth at bedtime as needed 16)  Trazodone Hcl 100 Mg Tabs (Trazodone hcl) .... 1/2 by mouth at bedtime as needed 17)  Doxycycline Monohydrate 100 Mg Tabs (Doxycycline monohydrate) .[name].. 1 by mouth two times a day 18)  Lipitor 40 Mg Tabs (Atorvastatin calcium) .[name].. 1 by mouth once daily 19)  Omeprazole 20 Mg Tbec (Omeprazole) .[name].. 1 by mouth two times a day 20)  Nexium 40 Mg Cpdr (Esomeprazole magnesium) .[name].. 1 po once daily 21)  Accupril 10 Mg Tabs (Quinapril hcl) .[name].. 1 by mouth 22)  Lidoderm 5 % Ptch (Lidocaine) .[name].. 1 patch three times a day as needed  Patient Instructions: 1)  Please take all new medications as prescribed - the lidoderm 2)  Continue all previous medications as before this visit 3)  Please schedule a follow-up appointment in 1 month iwth "welcome to medicare physical" with CPX labs and: 4)  HbgA1C prior to visit, ICD-9: 250.02 5)  Urine Microalbumin prior to visit, ICD-9: Prescriptions: LIDODERM 5 % PTCH (LIDOCAINE) 1 patch three times a day as needed  #1box x 5   Entered and Authorized by:   [name] MD   Signed by:   [name] MD on 02/21/2010   Method used:   Print then Give to Patient   RxID:   [id number]

## 2010-11-16 NOTE — Progress Notes (Signed)
Summary: Rx change  Phone Note Call from Patient Call back at Home Phone [phone]   Caller: Patient Summary of Call: Pt called stating [employer] requires generic Soma to be changed to Tizanidine, please advise. Initial call taken by: Margaret Pyle, CMA,  September 18, 2010 10:17 AM  Follow-up for Phone Call        ok for tizanidine 4 mg three times a day as needed   done hardcopy to LIM side B - dahlia  Follow-up by: [name] MD,  September 18, 2010 1:00 PM  Additional Follow-up for Phone Call Additional follow up Details #1::        Pt advised, Rx faxed to [facility] per pt req Additional Follow-up by: Margaret Pyle, CMA,  September 18, 2010 1:53 PM    New/Updated Medications: TIZANIDINE HCL 4 MG TABS (TIZANIDINE HCL) 1 by mouth three times a day as needed Prescriptions: TIZANIDINE HCL 4 MG TABS (TIZANIDINE HCL) 1 by mouth three times a day as needed  #90 x 5   Entered and Authorized by:   [name] MD   Signed by:   [name] MD on 09/18/2010   Method used:   Print then Give to Patient   RxID:   [id number]

## 2010-11-16 NOTE — Progress Notes (Signed)
  Phone Note Refill Request  on December 07, 2009 11:33 AM  Refills Requested: Medication #1:  ADVAIR DISKUS 250-50 MCG/DOSE MISC Inhale 1 puff as directed twice a day   Dosage confirmed as above?Dosage Confirmed   Notes: [facility] Department Initial call taken by: Scharlene Gloss,  December 07, 2009 11:34 AM    Prescriptions: PROVENTIL HFA 108 (90 BASE) MCG/ACT AERS (ALBUTEROL SULFATE) 2 puffs qid prn  #3 x 3   Entered by:   Scharlene Gloss   Authorized by:   [name] MD   Signed by:   Scharlene Gloss on 12/07/2009   Method used:   Faxed to ...       [facility] Department (retail)       [address] [place], [place]  [address]       Ph: [phone]       Fax: [phone]   RxID:   [id number] ADVAIR DISKUS 250-50 MCG/DOSE MISC (FLUTICASONE-SALMETEROL) Inhale 1 puff as directed twice a day  #3 x 3   Entered by:   Scharlene Gloss   Authorized by:   [name] MD   Signed by:   Scharlene Gloss on 12/07/2009   Method used:   Faxed to ...       [facility] Department (retail)       [address] [place], [place]  [address]       Ph: [phone]       Fax: [phone]   RxID:   [id number]

## 2010-11-16 NOTE — Progress Notes (Signed)
Summary: Med. Refill  Phone Note Refill Request Message from:  Fax from Pharmacy on May 15, 2010 3:52 PM  Refills Requested: Medication #1:  HYDROCODONE-ACETAMINOPHEN 10-325 MG  TABS 1 by mouth two times a day as needed - to fill mar 14   Dosage confirmed as above?Dosage Confirmed   Last Refilled: 12/26/2009   Notes: [facility] [phone] Initial call taken by: Zella Ball Ewing CMA Duncan Dull),  May 15, 2010 3:53 PM  Follow-up for Phone Call        [facility] hard[facility] to  [facility]. [phone] Follow-up by: Zella Ball Ewing CMA Duncan Dull),  May 15, 2010 4:30 PM    New/Updated Medications: HYDROCODONE-ACETAMINOPHEN 10-325 MG  TABS (HYDROCODONE-ACETAMINOPHEN) 1 by mouth two times a day as needed - to fill May 15, 2010 Prescriptions: HYDROCODONE-ACETAMINOPHEN 10-325 MG  TABS (HYDROCODONE-ACETAMINOPHEN) 1 by mouth two times a day as needed - to fill May 15, 2010  #60 x 3   Entered and Authorized by:   [name] MD   Signed by:   [name] MD on 05/15/2010   Method used:   Print then Give to Patient   RxID:   [id number]  done hardcopy to LIM side B - dahlia  [name] MD  May 15, 2010 4:14 PM

## 2010-11-16 NOTE — Assessment & Plan Note (Signed)
Summary: 6 MO ROV /NWS  #   Vital Signs:  Patient profile:   55 year old male Height:      69 inches Weight:      314.25 pounds BMI:     46.57 O2 Sat:      94 % on Room air Temp:     98.1 degrees F oral Pulse rate:   74 / minute BP sitting:   120 / 68  (left arm) Cuff size:   large  Vitals Entered By: Zella Ball Ewing CMA Duncan Dull) (September 25, 2010 10:45 AM)  O2 Flow:  Room air CC: 6 month ROV/RE   CC:  6 month ROV/RE.  History of Present Illness: here for f/u;  overall doing well;  Pt denies CP, worsening sob, doe, wheezing, orthopnea, pnd, worsening LE edema, palps, dizziness or syncope  Pt denies new neuro symptoms such as headache, facial or extremity weakness  Pt denies polydipsia, polyuria, or low sugar symptoms such as shakiness improved with eating.  Overall good compliance with meds, trying to follow low chol, DM diet, wt stable, little excercise however  CBG's in the low 100's.  No fever, wt loss, night sweats, loss of appetite or other constitutional symptoms  Overall good compliance with meds, and good tolerability.   Now quit smoking x 3 yrs,  no recent ETOH, and off all narcotic pain meds for 2 mo;  still with signficinat anxiety , wife left him 2 yrs ago, financials quite tight, Denies worsening depressive symptoms, suicidal ideation, or panic.    Has rash today, non hive like to the torso - several small red erythema areas < 5 mm , also prox extremities without swelilng, tongue swelling, sob/doe or wheezing.  , overall mild for 1 wk.  No prior hx.  No recent med changes or   Preventive Screening-Counseling & Management      Drug Use:  no.    Problems Prior to Update: 1)  Rash-nonvesicular  (ICD-782.1) 2)  Facial Rash  (ICD-782.1) 3)  Shoulder Pain, Left  (ICD-719.41) 4)  Chronic Pain Syndrome  (ICD-338.4) 5)  Preventive Health Care  (ICD-V70.0) 6)  Rash-nonvesicular  (ICD-782.1) 7)  Orchitis  (ICD-604.90) 8)  Frequency, Urinary  (ICD-788.41) 9)  Fibromyalgia   (ICD-729.1) 10)  Asthma  (ICD-493.90) 11)  Nephrolithiasis, Hx of  (ICD-V13.01) 12)  Polyarthralgia  (ICD-719.49) 13)  Peripheral Edema  (ICD-782.3) 14)  Sleep Apnea, Obstructive  (ICD-327.23) 15)  Dyskinesia, Esophagus  (ICD-530.5) 16)  Benign Prostatic Hypertrophy  (ICD-600.00) 17)  Diabetes Mellitus, Type II  (ICD-250.00) 18)  Low Back Pain  (ICD-724.2) 19)  Gerd  (ICD-530.81) 20)  Anxiety  (ICD-300.00) 21)  Allergic Rhinitis  (ICD-477.9) 22)  Insomnia  (ICD-780.52) 23)  Plantar Fasciitis, Bilateral  (ICD-728.71) 24)  Morbid Obesity  (ICD-278.01) 25)  Obsessive-compulsive Disorder  (ICD-300.3) 26)  Hypertension  (ICD-401.9) 27)  Hyperlipidemia  (ICD-272.4) 28)  Depression  (ICD-311)  Medications Prior to Update: 1)  Advair Diskus 250-50 Mcg/dose Misc (Fluticasone-Salmeterol) .... Inhale 1 Puff As Directed Twice A Day 2)  Ambien 10 Mg  Tabs (Zolpidem Tartrate) .[name].. 1 By Mouth At Bedtime As Needed 3)  Tizanidine Hcl 4 Mg Tabs (Tizanidine Hcl) .[name].. 1 By Mouth Three Times A Day As Needed 4)  Proventil Hfa 108 (90 Base) Mcg/act Aers (Albuterol Sulfate) .... 2 Puffs Qid Prn 5)  Veramyst 27.5 Mcg/spray Susp (Fluticasone Furoate) .... Spray 2 Spray Into Both Nostrils Every Morning 6)  Ibuprofen 800 Mg Tabs (Ibuprofen) .Marland KitchenMarland KitchenMarland Kitchen  1 By Mouth Two Times A Day 7)  Hydrocodone-Acetaminophen 10-325 Mg  Tabs (Hydrocodone-Acetaminophen) .[name].. 1 By Mouth Two Times A Day As Needed - To Fill May 15, 2010 8)  Ecotrin Low Strength 81 Mg  Tbec (Aspirin) .[name].. 1po Qd 9)  Lisinopril-Hydrochlorothiazide 20-25 Mg Tabs (Lisinopril-Hydrochlorothiazide) .[name].. 1po Once Daily 10)  Fexofenadine Hcl 180 Mg Tabs (Fexofenadine Hcl) .[name].. 1po Once Daily As Needed 11)  Nuvigil 150 Mg  Tabs (Armodafinil) .[name].. 1 By Mouth Once Daily 12)  Simvastatin 40 Mg Tabs (Simvastatin) .[name].. 1po Once Daily 13)  Sertraline Hcl 100 Mg  Tabs (Sertraline Hcl) .... 2 By Mouth Once Daily 14)  Trazodone Hcl 100 Mg Tabs (Trazodone Hcl) .... 1/2 By  Mouth At Bedtime As Needed 15)  Omeprazole 20 Mg Cpdr (Omeprazole) .... 2 By Mouth Once Daily 16)  Lidoderm 5 % Ptch (Lidocaine) .[name].. 1 Patch Three Times A Day As Needed 17)  Evoxac 30 Mg Caps (Cevimeline Hcl) .[name].. 1 By Mouth Three Times A Day For Dry Mouth 18)  Fentanyl 25 Mcg/hr Pt72 (Fentanyl) .[name].. 1 Patch Q 3 Dyas As Needed 19)  Cetirizine Hcl 10 Mg Tabs (Cetirizine Hcl) .... By Mouth Once Daily As Needed 20)  Alprazolam 1 Mg Xr24h-Tab (Alprazolam) .[name].. 1po Once Daily - To Fill Sep 20, 2010  Current Medications (verified): 1)  Ambien 10 Mg  Tabs (Zolpidem Tartrate) .[name].. 1 By Mouth At Bedtime As Needed 2)  Tizanidine Hcl 4 Mg Tabs (Tizanidine Hcl) .[name].. 1 By Mouth Three Times A Day As Needed 3)  Proventil Hfa 108 (90 Base) Mcg/act Aers (Albuterol Sulfate) .... 2 Puffs Qid Prn 4)  Ibuprofen 800 Mg Tabs (Ibuprofen) .[name].. 1 By Mouth Two Times A Day 5)  Ecotrin Low Strength 81 Mg  Tbec (Aspirin) .[name].. 1po Qd 6)  Lisinopril-Hydrochlorothiazide 20-25 Mg Tabs (Lisinopril-Hydrochlorothiazide) .[name].. 1po Once Daily 7)  Nuvigil 150 Mg  Tabs (Armodafinil) .[name].. 1 By Mouth Once Daily 8)  Lipitor 20 Mg Tabs (Atorvastatin Calcium) .[name].. 1po Once Daily 9)  Sertraline Hcl 100 Mg  Tabs (Sertraline Hcl) .... 2 By Mouth Once Daily 10)  Trazodone Hcl 100 Mg Tabs (Trazodone Hcl) .... 1/2 By Mouth At Bedtime As Needed 11)  Alprazolam 1 Mg Xr24h-Tab (Alprazolam) .[name].. 1po Once Daily - To Fill Sep 20, 2010 12)  Prednisone 10 Mg Tabs (Prednisone) .... 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   Head:  normocephalic and atraumatic.   Eyes:  vision grossly intact, pupils equal, and pupils round.   Ears:  R ear normal and L ear normal.   Nose:  no external deformity and no nasal discharge.   Mouth:  no gingival abnormalities and  pharynx pink and moist.   Neck:  supple and no masses.   Lungs:  normal respiratory effort and normal breath sounds.   Heart:  normal rate and regular rhythm.   Abdomen:  soft, non-tender, and normal bowel sounds.   Msk:  no joint tenderness and no joint swelling.   Extremities:  no edema, no erythema  Skin:  nontender rash with mult < 5 mm ithcy areas nonraised erythema to torso Psych:  dysphoric affect and slightly anxious.     Impression & Recommendations:  Problem # 1:  RASH-NONVESICULAR (ICD-782.1)  allergic type but not hive like, doubt ace related, for depomedrol IM , and predpack for home, Continue all other previous medications as before this visit   Orders: Depo- Medrol 40mg  (J1030) Depo- Medrol 80mg  (J1040) Admin of Therapeutic Inj  intramuscular or subcutaneous (30865)  Problem # 2:  DIABETES MELLITUS, TYPE II (ICD-250.00)  His updated medication list for this problem includes:    Ecotrin Low Strength 81 Mg Tbec (Aspirin) .[name]... 1po qd    Lisinopril-hydrochlorothiazide 20-25 Mg Tabs (Lisinopril-hydrochlorothiazide) .[name]... 1po once daily  Orders: TLB-BMP (Basic Metabolic Panel-BMET) (80048-METABOL) TLB-Lipid Panel (80061-LIPID) TLB-A1C / Hgb  A1C (Glycohemoglobin) (83036-A1C)  Labs Reviewed: Creat: 0.7 (03/21/2010)    Reviewed HgBA1c results: 6.7 (03/21/2010)  7.0 (04/12/2009) stable overall by hx and exam, ok to continue meds/tx as is , Pt to cont DM diet, excercise, wt control efforts; to check labs today , to call for > 200 cbg on steroid tx as above  Problem # 3:  HYPERTENSION (ICD-401.9)  His updated medication list for this problem includes:    Lisinopril-hydrochlorothiazide 20-25 Mg Tabs (Lisinopril-hydrochlorothiazide) .[name]... 1po once daily  BP today: 120/68 Prior BP: 122/70 (05/18/2010)  Labs Reviewed: K+: 4.6 (03/21/2010) Creat: : 0.7 (03/21/2010)   Chol: 238 (03/21/2010)   HDL: 39.50 (03/21/2010)   LDL: DEL (07/01/2007)   TG: 174.0 (03/21/2010) stable overall by hx and exam, ok to continue meds/tx as is   Problem # 4:  DEPRESSION (ICD-311)  His updated medication list for this problem includes:    Sertraline Hcl 100 Mg Tabs (Sertraline hcl) .[name]... 2 by mouth once daily    Trazodone Hcl 100 Mg Tabs (Trazodone hcl) .[name]... 1/2 by mouth at bedtime as needed    Alprazolam 1 Mg Xr24h-tab (Alprazolam) .[name]... 1po once daily - to fill Sep 20, 2010 stable overall by hx and exam, ok to continue meds/tx as is , Denies worsening depressive symptoms, suicidal ideation, or panic.    Complete Medication List: 1)  Ambien 10 Mg Tabs (Zolpidem tartrate) .[name].. 1 by mouth at bedtime as needed 2)  Tizanidine Hcl 4 Mg Tabs (Tizanidine hcl) .[name].. 1 by mouth three times a day as needed 3)  Proventil Hfa 108 (90 Base) Mcg/act Aers (Albuterol sulfate) .... 2 puffs qid prn 4)  Ibuprofen 800 Mg Tabs (Ibuprofen) .[name].. 1 by mouth two times a day 5)  Ecotrin Low Strength 81 Mg Tbec (Aspirin) .[name].. 1po qd 6)  Lisinopril-hydrochlorothiazide 20-25 Mg Tabs (Lisinopril-hydrochlorothiazide) .[name].. 1po once daily 7)  Nuvigil 150 Mg Tabs (Armodafinil) .[name].. 1 by mouth once daily 8)  Lipitor 20 Mg Tabs (Atorvastatin calcium) .[name].. 1po once daily 9)   Sertraline Hcl 100 Mg Tabs (Sertraline hcl) .... 2 by mouth once daily 10)  Trazodone Hcl 100 Mg Tabs (Trazodone hcl) .... 1/2 by mouth at bedtime as needed 11)  Alprazolam 1 Mg Xr24h-tab (Alprazolam) .[name].. 1po once daily - to fill Sep 20, 2010 12)  Prednisone 10 Mg Tabs (Prednisone) .... 3po qd for 3days, then 2po qd for 3days, then 1po qd for 3days, then stop  Patient Instructions: 1)  you had the steroid shot today 2)  Please take all new medications as prescribed 3)  Continue all previous medications as before this visit  4)  Please go to the Lab in the basement for your blood and/or urine tests today 5)  Please call the number on the [facility] Card for results of your testing 6)  Please schedule a follow-up appointment in 6 months for CPX with: 7)  HbgA1C prior to visit, ICD-9: 250.02 8)  Urine Microalbumin prior to visit, ICD-9: 9)  You can cancel the thursday appt this wk Prescriptions: PREDNISONE 10 MG TABS (PREDNISONE) 3po qd for 3days, then 2po qd for 3days, then 1po qd for 3days, then stop  #18 x 0   Entered and Authorized by:   [name] MD   Signed by:   [name] MD on 09/25/2010   Method used:   Electronically to        [facility] [phone]* (retail)       [address], [place]  [address]       Ph: [phone] or [phone]       Fax: [phone]   RxID:   [id number]    Medication Administration  Injection # 1:    Medication: Depo- Medrol 40mg     Diagnosis: RASH-NONVESICULAR (ICD-782.1)    Route: IM    Site: LUOQ gluteus    Exp Date: 01/2013    Lot #: [id number]    Mfr: [employer]    Comments: Patient received 120mg  Depo-medrol    Patient tolerated injection without complications    Given by: Zella Ball Ewing CMA Duncan Dull) (September 25, 2010 12:08 PM)  Injection # 2:    Medication: Depo- Medrol 80mg     Diagnosis: RASH-NONVESICULAR (ICD-782.1)    Route: IM    Site: LUOQ gluteus    Exp Date: 01/2013    Lot #: [id number]    Mfr: [employer]    Given by:  Zella Ball Ewing CMA Duncan Dull) (September 25, 2010 12:08 PM)  Orders Added: 1)  Depo- Medrol 40mg  [J1030] 2)  Depo- Medrol 80mg  [J1040] 3)  Admin of Therapeutic Inj  intramuscular or subcutaneous [96372] 4)  TLB-BMP (Basic Metabolic Panel-BMET) [80048-METABOL] 5)  TLB-Lipid Panel [80061-LIPID] 6)  TLB-A1C / Hgb A1C (Glycohemoglobin) [83036-A1C] 7)  Est. Patient Level IV [51884]

## 2010-11-16 NOTE — Progress Notes (Signed)
Summary: Med refill  Phone Note Refill Request  on December 26, 2009 10:27 AM  Refills Requested: Medication #1:  HYDROCODONE-ACETAMINOPHEN 10-325 MG  TABS 1 by mouth two times a day as needed - to fill nov 17   Dosage confirmed as above?Dosage Confirmed   Notes: [facility]. [phone] Initial call taken by: Scharlene Gloss,  December 26, 2009 10:28 AM  Follow-up for Phone Call        done hardcopy to LIM side B - dahlia  Follow-up by: [name] MD,  December 26, 2009 12:49 PM  Additional Follow-up for Phone Call Additional follow up Details #1::        rx faxed to pharmacy Additional Follow-up by: Margaret Pyle, CMA,  December 26, 2009 1:08 PM    New/Updated Medications: HYDROCODONE-ACETAMINOPHEN 10-325 MG  TABS (HYDROCODONE-ACETAMINOPHEN) 1 by mouth two times a day as needed - to fill Dec 26, 2009 Prescriptions: HYDROCODONE-ACETAMINOPHEN 10-325 MG  TABS (HYDROCODONE-ACETAMINOPHEN) 1 by mouth two times a day as needed - to fill Dec 26, 2009  #60 x 3   Entered and Authorized by:   [name] MD   Signed by:   [name] MD on 12/26/2009   Method used:   Print then Give to Patient   RxID:   [id number]

## 2010-11-16 NOTE — Progress Notes (Signed)
Summary: Med Refill  Phone Note Refill Request  on December 12, 2009 10:43 AM  Refills Requested: Medication #1:  CLONAZEPAM 2 MG TABS 1 by mouth at bedtime as needed   Dosage confirmed as above?Dosage Confirmed   Last Refilled: 06/21/2009   Notes: [facility], [phone] Initial call taken by: Scharlene Gloss,  December 12, 2009 10:44 AM  Follow-up for Phone Call        rx faxed to phamacy Follow-up by: Margaret Pyle, CMA,  December 12, 2009 12:50 PM    Prescriptions: CLONAZEPAM 2 MG TABS (CLONAZEPAM) 1 by mouth at bedtime as needed  #30 x 5   Entered and Authorized by:   [name] MD   Signed by:   [name] MD on 12/12/2009   Method used:   Print then Give to Patient   RxID:   [id number]  done hardcopy to LIM side B - dahlia  [name] MD  December 12, 2009 12:06 PM

## 2010-11-16 NOTE — Assessment & Plan Note (Signed)
Summary: WELCOME TO MEDICARE CPX/NWS  #   Vital Signs:  Patient profile:   55 year old male Height:      70 inches Weight:      327.50 pounds BMI:     47.16 O2 Sat:      94 % on Room air Temp:     98.1 degrees F oral Pulse rate:   84 / minute BP sitting:   150 / 88  (left arm) Cuff size:   large  Vitals Entered ByZella Ball Ewing (March 29, 2010 2:28 PM)  O2 Flow:  Room air  CC: Adult Physical, Medicare/Re   CC:  Adult Physical and Medicare/Re.  History of Present Illness: overall doing ok ; crestor causes nausea  so not taking;  and needs meds changed to generic if possi ble due to insurance  ;  Pt denies CP, sob, doe, wheezing, orthopnea, pnd, worsening LE edema, palps, dizziness or syncope  Pt denies new neuro symptoms such as headache, facial or extremity weakness  No worsening OSA symtpoms or daytime fatigue - only takes the nuvigil infrequently  Problems Prior to Update: 1)  Preventive Health Care  (ICD-V70.0) 2)  Rash-nonvesicular  (ICD-782.1) 3)  Orchitis  (ICD-604.90) 4)  Frequency, Urinary  (ICD-788.41) 5)  Fibromyalgia  (ICD-729.1) 6)  Asthma  (ICD-493.90) 7)  Nephrolithiasis, Hx of  (ICD-V13.01) 8)  Polyarthralgia  (ICD-719.49) 9)  Peripheral Edema  (ICD-782.3) 10)  Sleep Apnea, Obstructive  (ICD-327.23) 11)  Dyskinesia, Esophagus  (ICD-530.5) 12)  Benign Prostatic Hypertrophy  (ICD-600.00) 13)  Diabetes Mellitus, Type II  (ICD-250.00) 14)  Low Back Pain  (ICD-724.2) 15)  Gerd  (ICD-530.81) 16)  Anxiety  (ICD-300.00) 17)  Allergic Rhinitis  (ICD-477.9) 18)  Insomnia  (ICD-780.52) 19)  Plantar Fasciitis, Bilateral  (ICD-728.71) 20)  Morbid Obesity  (ICD-278.01) 21)  Obsessive-compulsive Disorder  (ICD-300.3) 22)  Hypertension  (ICD-401.9) 23)  Hyperlipidemia  (ICD-272.4) 24)  Depression  (ICD-311)  Medications Prior to Update: 1)  Advair Diskus 250-50 Mcg/dose Misc (Fluticasone-Salmeterol) .... Inhale 1 Puff As Directed Twice A Day 2)  Ambien 10 Mg  Tabs  (Zolpidem Tartrate) .[name].. 1 By Mouth At Bedtime As Needed 3)  Soma 350 Mg  Tabs (Carisoprodol) .[name].. 1 By Mouth Three Times A Day As Needed 4)  Doxazosin Mesylate 1 Mg  Tabs (Doxazosin Mesylate) .[name].. 1po At Bedtime 5)  Proventil Hfa 108 (90 Base) Mcg/act Aers (Albuterol Sulfate) .... 2 Puffs Qid Prn 6)  Veramyst 27.5 Mcg/spray Susp (Fluticasone Furoate) .... Spray 2 Spray Into Both Nostrils Every Morning 7)  Ibuprofen 200 Mg  Tabs (Ibuprofen) .... 2 By Mouth Qid Prn 8)  Hydrocodone-Acetaminophen 10-325 Mg  Tabs (Hydrocodone-Acetaminophen) .[name].. 1 By Mouth Two Times A Day As Needed - To Fill Dec 26, 2009 9)  Ecotrin Low Strength 81 Mg  Tbec (Aspirin) .[name].. 1po Qd 10)  Hydrochlorothiazide 25 Mg Tabs (Hydrochlorothiazide) .... Take One (1) By Mouth Daily 11)  Xyzal 5 Mg  Tabs (Levocetirizine Dihydrochloride) .[name].. 1 By Mouth Once Daily 12)  Nuvigil 150 Mg  Tabs (Armodafinil) .[name].. 1 By Mouth Once Daily 13)  Crestor 20 Mg  Tabs (Rosuvastatin Calcium) .[name].. 1 By Mouth Once Daily 14)  Sertraline Hcl 100 Mg  Tabs (Sertraline Hcl) .... 2 By Mouth Once Daily 15)  Clonazepam 2 Mg Tabs (Clonazepam) .[name].. 1 By Mouth At Bedtime As Needed 16)  Trazodone Hcl 100 Mg Tabs (Trazodone Hcl) .... 1/2 By Mouth At Bedtime As Needed 17)  Doxycycline  Monohydrate 100 Mg Tabs (Doxycycline Monohydrate) .[name].. 1 By Mouth Two Times A Day 18)  Lipitor 40 Mg Tabs (Atorvastatin Calcium) .[name].. 1 By Mouth Once Daily 19)  Omeprazole 20 Mg Tbec (Omeprazole) .[name].. 1 By Mouth Two Times A Day 20)  Nexium 40 Mg Cpdr (Esomeprazole Magnesium) .[name].. 1 Po Once Daily 21)  Accupril 10 Mg Tabs (Quinapril Hcl) .[name].. 1 By Mouth 22)  Lidoderm 5 % Ptch (Lidocaine) .[name].. 1 Patch Three Times A Day As Needed 23)  Lidoderm 5 % Ptch (Lidocaine) .[name].. 1 Patch Two Times A Day As Needed  Current Medications (verified): 1)  Advair Diskus 250-50 Mcg/dose Misc (Fluticasone-Salmeterol) .... Inhale 1 Puff As Directed Twice A Day 2)  Ambien 10 Mg  Tabs (Zolpidem Tartrate)  .[name].. 1 By Mouth At Bedtime As Needed 3)  Soma 350 Mg  Tabs (Carisoprodol) .[name].. 1 By Mouth Three Times A Day As Needed 4)  Tamsulosin Hcl 0.4 Mg Caps (Tamsulosin Hcl) .[name].. 1 By Mouth Once Daily 5)  Proventil Hfa 108 (90 Base) Mcg/act Aers (Albuterol Sulfate) .... 2 Puffs Qid Prn 6)  Veramyst 27.5 Mcg/spray Susp (Fluticasone Furoate) .... Spray 2 Spray Into Both Nostrils Every Morning 7)  Ibuprofen 800 Mg Tabs (Ibuprofen) .[name].. 1 By Mouth Two Times A Day 8)  Hydrocodone-Acetaminophen 10-325 Mg  Tabs (Hydrocodone-Acetaminophen) .[name].. 1 By Mouth Two Times A Day As Needed - To Fill Dec 26, 2009 9)  Ecotrin Low Strength 81 Mg  Tbec (Aspirin) .[name].. 1po Qd 10)  Lisinopril-Hydrochlorothiazide 20-25 Mg Tabs (Lisinopril-Hydrochlorothiazide) .[name].. 1po Once Daily 11)  Fexofenadine Hcl 180 Mg Tabs (Fexofenadine Hcl) .[name].. 1po Once Daily As Needed 12)  Nuvigil 150 Mg  Tabs (Armodafinil) .[name].. 1 By Mouth Once Daily 13)  Simvastatin 40 Mg Tabs (Simvastatin) .[name].. 1po Once Daily 14)  Sertraline Hcl 100 Mg  Tabs (Sertraline Hcl) .... 2 By Mouth Once Daily 15)  Alprazolam Xr 1 Mg Xr24h-Tab (Alprazolam) .[name].. 1 By Mouth At Bedtime As Needed 16)  Trazodone Hcl 100 Mg Tabs (Trazodone Hcl) .... 1/2 By Mouth At Bedtime As Needed 17)  Omeprazole 20 Mg Cpdr (Omeprazole) .... 2 By Mouth Once Daily 18)  Lidoderm 5 % Ptch (Lidocaine) .[name].. 1 Patch Three Times A Day As Needed 19)  Lidoderm 5 % Ptch (Lidocaine) .[name].. 1 Patch Two Times A Day As Needed 20)  Evoxac 30 Mg Caps (Cevimeline Hcl) .[name].. 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 Alcohol use-yes Former Smoker  Risk Factors: Smoking Status: quit  (08/10/2009)  Past Medical History: Depression Hyperlipidemia Hypertension OCD Morbid Obesity Plantar Fascitis, bilateral Insomnia Allergic rhinitis Anxiety GERD Low back pain Diabetes mellitus, type II Benign prostatic hypertrophy Dyshagia OSA Nephrolithiasis, hx of Asthma Fibromyalgia  Review of Systems  The patient denies anorexia, fever, weight loss, vision loss, decreased hearing, hoarseness, chest pain, syncope, dyspnea on exertion, peripheral edema, prolonged cough, headaches, hemoptysis, abdominal pain, melena, hematochezia, severe indigestion/heartburn, hematuria, muscle weakness, suspicious skin lesions, transient blindness, difficulty walking, depression, unusual weight change, abnormal bleeding, enlarged lymph nodes, and angioedema.         all otherwise negative per pt -    Physical Exam  General:  alert and overweight-appearing.   Head:  normocephalic and atraumatic.   Eyes:  vision grossly intact, pupils equal, and pupils round.   Ears:  R ear normal and L ear normal.   Nose:  no external deformity and no external erythema.   Mouth:  no gingival abnormalities and pharynx pink and moist.   Neck:  supple and no masses.   Lungs:  normal respiratory effort and normal breath sounds.   Heart:  normal rate and regular rhythm.   Abdomen:  soft, non-tender, and normal bowel sounds.   Msk:  no joint tenderness and no joint swelling.   Extremities:  no edema, no erythema  Neurologic:  cranial nerves II-XII intact and strength normal in all extremities.   Skin:  color normal and no rashes.   Psych:  not depressed appearing and moderately anxious.     Impression & Recommendations:  Problem # 1:  Preventive Health Care (ICD-V70.0)  Overall doing well, age appropriate education and counseling updated and referral for appropriate preventive services done unless declined, immunizations up to date or declined, diet counseling done if overweight, urged to quit smoking  if smokes , most recent labs reviewed and current ordered if appropriate, ecg reviewed or declined (interpretation per ECG scanned in the EMR if done); information regarding Medicare Prevention requirements given if appropriate; speciality referrals updated as appropriate   Orders: EKG w/ Interpretation (93000)  Problem # 2:  HYPERLIPIDEMIA (ICD-272.4)  The following medications were removed from the medication list:    Lipitor 40 Mg Tabs (Atorvastatin calcium) .[name]... 1 by mouth once daily His updated medication list for this problem includes:    Simvastatin 40 Mg Tabs (Simvastatin) .[name]... 1po once daily to change to simvastatin until lipitor becomes generic later this yr  Labs Reviewed: SGOT: 19 (03/21/2010)   SGPT: 26 (03/21/2010)   HDL:39.50 (03/21/2010), 36.10 (04/12/2009)  LDL:DEL (07/01/2007)  Chol:238 (03/21/2010), 228 (04/12/2009)  Trig:174.0 (03/21/2010), 175.0 (04/12/2009)  Problem # 3:  BENIGN PROSTATIC HYPERTROPHY (ICD-600.00)  His updated medication list for this problem includes:    Tamsulosin Hcl 0.4 Mg Caps (Tamsulosin hcl) .[name]... 1 by mouth once daily and urianry freq - refer to [employer] [facility] [facility] per pt request,  treat as above, f/u any worsening signs or symptoms , stop the doxasozin , refer urology  Orders: Urology Referral (Urology)  Complete Medication List: 1)  Advair Diskus 250-50 Mcg/dose Misc (Fluticasone-salmeterol) .... Inhale 1 puff as directed twice a day 2)  Ambien 10 Mg Tabs (Zolpidem tartrate) .[name].. 1 by mouth at bedtime as needed 3)  Soma 350 Mg Tabs (Carisoprodol) .[name].. 1 by mouth three times a day as needed 4)  Tamsulosin Hcl 0.4 Mg Caps (Tamsulosin hcl) .[name].. 1 by mouth once daily 5)  Proventil Hfa 108 (90 Base) Mcg/act Aers (Albuterol sulfate) .... 2 puffs qid prn 6)  Veramyst 27.5 Mcg/spray Susp (Fluticasone furoate) .... Spray 2 spray into both nostrils every morning 7)  Ibuprofen 800 Mg Tabs (Ibuprofen) .[name].. 1 by mouth two times a day 8)   Hydrocodone-acetaminophen 10-325 Mg Tabs (Hydrocodone-acetaminophen) .[name].. 1 by mouth two times a day as needed - to fill Dec 26, 2009 9)  Ecotrin Low Strength 81 Mg Tbec (Aspirin) .[name].. 1po qd 10)  Lisinopril-hydrochlorothiazide 20-25 Mg Tabs (Lisinopril-hydrochlorothiazide) .[name].. 1po once daily 11)  Fexofenadine Hcl 180 Mg Tabs (Fexofenadine hcl) .[name].. 1po once daily as needed 12)  Nuvigil 150 Mg Tabs (Armodafinil) .[name].. 1 by mouth once daily 13)  Simvastatin 40 Mg Tabs (Simvastatin) .[name].. 1po once daily 14)  Sertraline Hcl  100 Mg Tabs (Sertraline hcl) .... 2 by mouth once daily 15)  Alprazolam Xr 1 Mg Xr24h-tab (Alprazolam) .[name].. 1 by mouth at bedtime as needed 16)  Trazodone Hcl 100 Mg Tabs (Trazodone hcl) .... 1/2 by mouth at bedtime as needed 17)  Omeprazole 20 Mg Cpdr (Omeprazole) .... 2 by mouth once daily 18)  Lidoderm 5 % Ptch (Lidocaine) .[name].. 1 patch three times a day as needed 19)  Lidoderm 5 % Ptch (Lidocaine) .[name].. 1 patch two times a day as needed 20)  Evoxac 30 Mg Caps (Cevimeline hcl) .[name].. 1 by mouth three times a day for dry mouth  Other Orders: Pneumococcal Vaccine (84132) Admin 1st Vaccine (44010)  Patient Instructions: 1)  you had the pneumonia shot today 2)  .stop the crestor, atacand, nexium, clonazepam, xyzal, and the doxasozin 3)  Please take all new medications as prescribed 4)  Continue all other previous medications as before this visit  5)  Please schedule a follow-up appointment in 6 months with : 6)  BMP prior to visit, ICD-9: 250.02 7)  Lipid Panel prior to visit, ICD-9: 8)  HbgA1C prior to visit, ICD-9: 9)  You will be contacted about the referral(s) to: urology Prescriptions: SERTRALINE HCL 100 MG  TABS (SERTRALINE HCL) 2 by mouth once daily  #180 x 3   Entered and Authorized by:   [name] MD   Signed by:   [name] MD on 03/29/2010   Method used:   Print then Give to Patient   RxID:   [id number] FEXOFENADINE HCL 180 MG TABS (FEXOFENADINE  HCL) 1po once daily as needed  #90 x 3   Entered and Authorized by:   [name] MD   Signed by:   [name] MD on 03/29/2010   Method used:   Print then Give to Patient   RxID:   [id number] IBUPROFEN 800 MG TABS (IBUPROFEN) 1 by mouth two times a day  #180 x 3   Entered and Authorized by:   [name] MD   Signed by:   [name] MD on 03/29/2010   Method used:   Print then Give to Patient   RxID:   [id number] TAMSULOSIN HCL 0.4 MG CAPS (TAMSULOSIN HCL) 1 by mouth once daily  #90 x 3   Entered and Authorized by:   [name] MD   Signed by:   [name] MD on 03/29/2010   Method used:   Print then Give to Patient   RxID:   [id number] OMEPRAZOLE 20 MG CPDR (OMEPRAZOLE) 2 by mouth once daily  #180 x 3   Entered and Authorized by:   [name] MD   Signed by:   [name] MD on 03/29/2010   Method used:   Print then Give to Patient   RxID:   [id number] LISINOPRIL-HYDROCHLOROTHIAZIDE 20-25 MG TABS (LISINOPRIL-HYDROCHLOROTHIAZIDE) 1po once daily  #90 x 3   Entered and Authorized by:   [name] MD   Signed by:   [name] MD on 03/29/2010   Method used:   Print then Give to Patient   RxID:   [id number] ALPRAZOLAM XR 1 MG XR24H-TAB (ALPRAZOLAM) 1 by mouth at bedtime as needed  #30 x 11   Entered and Authorized by:   [name] MD   Signed by:   [name] MD on 03/29/2010   Method used:   Print then Give to Patient  RxID:   [id number] AMBIEN 10 MG  TABS (ZOLPIDEM TARTRATE) 1 by mouth at bedtime as needed  #90 x 1   Entered and Authorized by:   [name] MD   Signed by:   [name] MD on 03/29/2010   Method used:   Print then Give to Patient   RxID:   [id number] TRAZODONE HCL 100 MG TABS (TRAZODONE HCL) 1/2 by mouth at bedtime as needed  #15 x 11   Entered and Authorized by:   [name] MD   Signed by:   [name] MD on 03/29/2010   Method used:   Electronically to        [facility] [phone]*  (retail)       [address]       [place], [place]  [address]       Ph: [phone] or [phone]       Fax: [phone]   RxID:   [id number] SIMVASTATIN 40 MG TABS (SIMVASTATIN) 1po once daily  #90 x 3   Entered and Authorized by:   [name] MD   Signed by:   [name] MD on 03/29/2010   Method used:   Electronically to        [facility] [phone]* (retail)       [address], [place]  [address]       Ph: [phone] or [phone]       Fax: [phone]   RxID:   [id number]    Immunizations Administered:  Pneumonia Vaccine:    Vaccine Type: Pneumovax    Site: left deltoid    Mfr: Merck    Dose: 0.5 ml    Route: IM    Given by: Zella Ball Ewing    Exp. Date: 08/09/2011    Lot #: [id number]    VIS given: 05/[birth year]/96 version given March 29, 2010.

## 2010-11-16 NOTE — Assessment & Plan Note (Signed)
Summary: left shoulder pain/cd   Vital Signs:  Patient profile:   54 year old male Height:      70 inches Weight:      321.50 pounds BMI:     46.30 O2 Sat:      94 % on Room air Temp:     98.2 degrees F oral Pulse rate:   74 / minute BP sitting:   122 / 70  (left arm) Cuff size:   large  Vitals Entered By: Zella Ball Ewing CMA Duncan Dull) (May 18, 2010 3:36 PM)  O2 Flow:  Room air CC: Left shoulder pain, medication refills/RE   CC:  Left shoulder pain and medication refills/RE.  History of Present Illness: left handed, now here with acute onset grad worsening left shoulder pain for 3 wks, worse to brush teeth, irritable today, using the ibuprofen more than last prescribed in addition to current meds, and requests pain clinic for overall pain issues as he hopes to get a nerve block (s/p mult nerve blocks in the past); brings meds today to show he is not overusing the lidoderm, and hydrocodone.  Has had up to 200 fentanyl patch in the past  Pt denies CP, sob, doe, wheezing, orthopnea, pnd, worsening LE edema, palps, dizziness or syncope  Pt denies new neuro symptoms such as headache, facial or extremity weakness .  Requests change of allegra as not working well  Problems Prior to Update: 1)  Facial Rash  (ICD-782.1) 2)  Shoulder Pain, Left  (ICD-719.41) 3)  Chronic Pain Syndrome  (ICD-338.4) 4)  Preventive Health Care  (ICD-V70.0) 5)  Rash-nonvesicular  (ICD-782.1) 6)  Orchitis  (ICD-604.90) 7)  Frequency, Urinary  (ICD-788.41) 8)  Fibromyalgia  (ICD-729.1) 9)  Asthma  (ICD-493.90) 10)  Nephrolithiasis, Hx of  (ICD-V13.01) 11)  Polyarthralgia  (ICD-719.49) 12)  Peripheral Edema  (ICD-782.3) 13)  Sleep Apnea, Obstructive  (ICD-327.23) 14)  Dyskinesia, Esophagus  (ICD-530.5) 15)  Benign Prostatic Hypertrophy  (ICD-600.00) 16)  Diabetes Mellitus, Type II  (ICD-250.00) 17)  Low Back Pain  (ICD-724.2) 18)  Gerd  (ICD-530.81) 19)  Anxiety  (ICD-300.00) 20)  Allergic Rhinitis   (ICD-477.9) 21)  Insomnia  (ICD-780.52) 22)  Plantar Fasciitis, Bilateral  (ICD-728.71) 23)  Morbid Obesity  (ICD-278.01) 24)  Obsessive-compulsive Disorder  (ICD-300.3) 25)  Hypertension  (ICD-401.9) 26)  Hyperlipidemia  (ICD-272.4) 27)  Depression  (ICD-311)  Medications Prior to Update: 1)  Advair Diskus 250-50 Mcg/dose Misc (Fluticasone-Salmeterol) .... Inhale 1 Puff As Directed Twice A Day 2)  Ambien 10 Mg  Tabs (Zolpidem Tartrate) .[name].. 1 By Mouth At Bedtime As Needed 3)  Soma 350 Mg  Tabs (Carisoprodol) .[name].. 1 By Mouth Three Times A Day As Needed 4)  Tamsulosin Hcl 0.4 Mg Caps (Tamsulosin Hcl) .[name].. 1 By Mouth Once Daily 5)  Proventil Hfa 108 (90 Base) Mcg/act Aers (Albuterol Sulfate) .... 2 Puffs Qid Prn 6)  Veramyst 27.5 Mcg/spray Susp (Fluticasone Furoate) .... Spray 2 Spray Into Both Nostrils Every Morning 7)  Ibuprofen 800 Mg Tabs (Ibuprofen) .[name].. 1 By Mouth Two Times A Day 8)  Hydrocodone-Acetaminophen 10-325 Mg  Tabs (Hydrocodone-Acetaminophen) .[name].. 1 By Mouth Two Times A Day As Needed - To Fill May 15, 2010 9)  Ecotrin Low Strength 81 Mg  Tbec (Aspirin) .[name].. 1po Qd 10)  Lisinopril-Hydrochlorothiazide 20-25 Mg Tabs (Lisinopril-Hydrochlorothiazide) .[name].. 1po Once Daily 11)  Fexofenadine Hcl 180 Mg Tabs (Fexofenadine Hcl) .[name].. 1po Once Daily As Needed 12)  Nuvigil 150 Mg  Tabs (Armodafinil) .Marland KitchenMarland KitchenMarland Kitchen  1 By Mouth Once Daily 13)  Simvastatin 40 Mg Tabs (Simvastatin) .[name].. 1po Once Daily 14)  Sertraline Hcl 100 Mg  Tabs (Sertraline Hcl) .... 2 By Mouth Once Daily 15)  Alprazolam Xr 1 Mg Xr24h-Tab (Alprazolam) .[name].. 1 By Mouth At Bedtime As Needed 16)  Trazodone Hcl 100 Mg Tabs (Trazodone Hcl) .... 1/2 By Mouth At Bedtime As Needed 17)  Omeprazole 20 Mg Cpdr (Omeprazole) .... 2 By Mouth Once Daily 18)  Lidoderm 5 % Ptch (Lidocaine) .[name].. 1 Patch Three Times A Day As Needed 19)  Lidoderm 5 % Ptch (Lidocaine) .[name].. 1 Patch Two Times A Day As Needed 20)  Evoxac 30 Mg Caps (Cevimeline Hcl) .[name].. 1  By Mouth Three Times A Day For Dry Mouth  Current Medications (verified): 1)  Advair Diskus 250-50 Mcg/dose Misc (Fluticasone-Salmeterol) .... Inhale 1 Puff As Directed Twice A Day 2)  Ambien 10 Mg  Tabs (Zolpidem Tartrate) .[name].. 1 By Mouth At Bedtime As Needed 3)  Soma 350 Mg  Tabs (Carisoprodol) .[name].. 1 By Mouth Three Times A Day As Needed 4)  Proventil Hfa 108 (90 Base) Mcg/act Aers (Albuterol Sulfate) .... 2 Puffs Qid Prn 5)  Veramyst 27.5 Mcg/spray Susp (Fluticasone Furoate) .... Spray 2 Spray Into Both Nostrils Every Morning 6)  Ibuprofen 800 Mg Tabs (Ibuprofen) .[name].. 1 By Mouth Two Times A Day 7)  Hydrocodone-Acetaminophen 10-325 Mg  Tabs (Hydrocodone-Acetaminophen) .[name].. 1 By Mouth Two Times A Day As Needed - To Fill May 15, 2010 8)  Ecotrin Low Strength 81 Mg  Tbec (Aspirin) .[name].. 1po Qd 9)  Lisinopril-Hydrochlorothiazide 20-25 Mg Tabs (Lisinopril-Hydrochlorothiazide) .[name].. 1po Once Daily 10)  Fexofenadine Hcl 180 Mg Tabs (Fexofenadine Hcl) .[name].. 1po Once Daily As Needed 11)  Nuvigil 150 Mg  Tabs (Armodafinil) .[name].. 1 By Mouth Once Daily 12)  Simvastatin 40 Mg Tabs (Simvastatin) .[name].. 1po Once Daily 13)  Sertraline Hcl 100 Mg  Tabs (Sertraline Hcl) .... 2 By Mouth Once Daily 14)  Trazodone Hcl 100 Mg Tabs (Trazodone Hcl) .... 1/2 By Mouth At Bedtime As Needed 15)  Omeprazole 20 Mg Cpdr (Omeprazole) .... 2 By Mouth Once Daily 16)  Lidoderm 5 % Ptch (Lidocaine) .[name].. 1 Patch Three Times A Day As Needed 17)  Evoxac 30 Mg Caps (Cevimeline Hcl) .[name].. 1 By Mouth Three Times A Day For Dry Mouth 18)  Fentanyl 25 Mcg/hr Pt72 (Fentanyl) .[name].. 1 Patch Q 3 Dyas As Needed 19)  Cetirizine Hcl 10 Mg Tabs (Cetirizine Hcl) .... 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   Head:  normocephalic and atraumatic.   Eyes:  vision grossly intact, pupils equal, and pupils round.   Ears:  R ear normal and L ear normal.   Nose:  no external deformity and no nasal discharge.   Mouth:  no gingival  abnormalities and pharynx pink and moist.   Neck:  supple and no masses.   Lungs:  normal respiratory effort and normal breath sounds.   Heart:  normal rate and regular rhythm.   Abdomen:  soft, non-tender, and normal bowel sounds.   Msk:  left shoulder with marked tender at the left bicipital tendon and AC joint area, cannot abduct > 90 degrees without pain but no specific bursa area tender Extremities:  no edema, no erythema  Neurologic:  cranial nerves II-XII intact, strength normal in all extremities, and gait normal.   Skin:  facial rash as above   Impression & Recommendations:  Problem # 1:  SHOULDER PAIN, LEFT (ICD-719.41)  His updated medication list for this problem includes:    Soma 350 Mg Tabs (Carisoprodol) .[name]... 1 by mouth three times a day as needed    Ibuprofen 800 Mg Tabs (Ibuprofen) .[name]... 1 by mouth two times a day    Hydrocodone-acetaminophen 10-325 Mg Tabs (Hydrocodone-acetaminophen) .[name]... 1 by mouth two times a day as needed - to fill May 15, 2010    Ecotrin Low Strength 81 Mg Tbec (Aspirin) .[name]... 1po qd    Fentanyl 25 Mcg/hr Pt72 (Fentanyl) .[name]... 1 patch q 3 dyas as needed subacute , treat as  above, f/u any worsening signs or symptoms , refer ortho as well - ? need cortisone to the left bicipital tendon   Orders: Orthopedic Surgeon Referral (Ortho Surgeon)  Problem # 2:  CHRONIC PAIN SYNDROME (ICD-338.4) also for pain clinic referral per pt, low dose fentanyl added today, Continue all previous medications as before this visit  Orders: Pain Clinic Referral (Pain)  Problem # 3:  HYPERTENSION (ICD-401.9)  His updated medication list for this problem includes:    Lisinopril-hydrochlorothiazide 20-25 Mg Tabs (Lisinopril-hydrochlorothiazide) .[name]... 1po once daily  BP today: 122/70 Prior BP: 150/88 (03/29/2010)  Labs Reviewed: K+: 4.6 (03/21/2010) Creat: : 0.7 (03/21/2010)   Chol: 238 (03/21/2010)   HDL: 39.50 (03/21/2010)   LDL: DEL (07/01/2007)   TG: 174.0 (03/21/2010) stable overall by hx and exam, ok to continue meds/tx as is   Problem # 4:  FACIAL RASH (ICD-782.1)  ? seborrhea vs other  - for referral to derm  Orders: Dermatology Referral (Derma)  Complete Medication List: 1)  Advair Diskus 250-50 Mcg/dose Misc (Fluticasone-salmeterol) .... Inhale 1 puff as directed twice a day 2)  Ambien 10 Mg Tabs (Zolpidem tartrate) .[name].. 1 by mouth at bedtime as needed 3)  Soma 350 Mg Tabs (Carisoprodol) .[name].. 1 by mouth three times a day as needed 4)  Proventil Hfa 108 (90 Base) Mcg/act Aers (Albuterol sulfate) .... 2 puffs qid prn 5)  Veramyst 27.5 Mcg/spray Susp (Fluticasone furoate) .... Spray 2 spray into both nostrils every morning 6)  Ibuprofen 800 Mg Tabs (Ibuprofen) .[name].. 1 by mouth two times a day 7)  Hydrocodone-acetaminophen 10-325 Mg Tabs (Hydrocodone-acetaminophen) .[name].. 1 by mouth two times a day as needed - to fill May 15, 2010 8)  Ecotrin Low Strength 81 Mg Tbec (Aspirin) .[name].. 1po qd 9)  Lisinopril-hydrochlorothiazide 20-25 Mg Tabs (Lisinopril-hydrochlorothiazide) .[name].. 1po once daily 10)  Fexofenadine Hcl 180 Mg Tabs (Fexofenadine hcl) .[name].. 1po once daily as  needed 11)  Nuvigil 150 Mg Tabs (Armodafinil) .[name].. 1 by mouth once daily 12)  Simvastatin 40 Mg Tabs (Simvastatin) .[name].. 1po once daily 13)  Sertraline Hcl 100 Mg Tabs (Sertraline hcl) .... 2 by mouth once daily 14)  Trazodone Hcl 100 Mg Tabs (Trazodone hcl) .... 1/2  by mouth at bedtime as needed 15)  Omeprazole 20 Mg Cpdr (Omeprazole) .... 2 by mouth once daily 16)  Lidoderm 5 % Ptch (Lidocaine) .[name].. 1 patch three times a day as needed 17)  Evoxac 30 Mg Caps (Cevimeline hcl) .[name].. 1 by mouth three times a day for dry mouth 18)  Fentanyl 25 Mcg/hr Pt72 (Fentanyl) .[name].. 1 patch q 3 dyas as needed 19)  Cetirizine Hcl 10 Mg Tabs (Cetirizine hcl) .... By mouth once daily as needed  Patient Instructions: 1)  Please take all new medications as prescribed 2)  Continue all previous medications as before this visit  3)  You will be contacted about the referral(s) to: orthopedic, pain clinic, and dermatology 4)  stop the alprazolam at night as it did not work 5)  stop the fexofenadine for allergies 6)  you can take the generic zyrtec for allergies if needed 7)  Please schedule a follow-up appointment as needed. Prescriptions: CETIRIZINE HCL 10 MG TABS (CETIRIZINE HCL) by mouth once daily as needed  #30 x 11   Entered and Authorized by:   [name] MD   Signed by:   [name] MD on 05/18/2010   Method used:   Print then Give to Patient   RxID:   [id number] FENTANYL 25 MCG/HR PT72 (FENTANYL) 1 patch q 3 dyas as needed  #10 x 0   Entered and Authorized by:   [name] MD   Signed by:   [name] MD on 05/18/2010   Method used:   Print then Give to Patient   RxID:   [id number] LIDODERM 5 % PTCH (LIDOCAINE) 1 patch three times a day as needed  #90 x 3   Entered and Authorized by:   [name] MD   Signed by:   [name] MD on 05/18/2010   Method used:   Print then Give to Patient   RxID:   [id number] EVOXAC 30 MG CAPS (CEVIMELINE HCL) 1 by mouth three times a day  for dry mouth  #90 x 11   Entered and Authorized by:   [name] MD   Signed by:   [name] MD on 05/18/2010   Method used:   Electronically to        [facility] [phone]* (retail)       [address]       [place], [place]  [address]       Ph: [phone] or [phone]       Fax: [phone]   RxID:   [id number]

## 2010-11-16 NOTE — Assessment & Plan Note (Signed)
Summary: FLU VAC--[facility]WJ  STC   Nurse Visit   Allergies: No Known Drug Allergies  Immunizations Administered:  Pneumonia Vaccine:    Vaccine Type: Pneumovax    Site: left deltoid    Mfr: Merck    Dose: 0.5 ml    Route: IM    Given by: Alysia Penna    Exp. Date: 12/28/2011    Lot #: [id number]    VIS given: 09/19/09 version given July 07, 2010.  Orders Added: 1)  Flu Vaccine 64yrs + MEDICARE PATIENTS [Q2039] 2)  Administration Flu vaccine - MCR [G0008] 3)  Pneumococcal Vaccine [90732] 4)  Admin 1st Vaccine [04540]      Flu Vaccine Consent Questions     Do you have a history of severe allergic reactions to this vaccine? no    Any prior history of allergic reactions to egg and/or gelatin? no    Do you have a sensitivity to the preservative Thimersol? no    Do you have a past history of Guillan-Barre Syndrome? no    Do you currently have an acute febrile illness? no    Have you ever had a severe reaction to latex? no    Vaccine information given and explained to patient? yes    Are you currently pregnant? no    Lot Number:[id number]   Exp Date:04/14/2011   Site Given  Right Deltoid IMu

## 2010-11-16 NOTE — Assessment & Plan Note (Signed)
Summary: DR Sammuel Cooper PT/NO CLINIC--BODY RASH  STC   Vital Signs:  Patient profile:   55 year old male Height:      69 inches (175.26 cm) Weight:      314 pounds (142.73 kg) O2 Sat:      90 % on Room air Temp:     98.2 degrees F (36.78 degrees C) oral Pulse rate:   75 / minute BP sitting:   110 / 60  (left arm) Cuff size:   large  Vitals Entered By: Orlan Leavens RMA (November 01, 2010 11:11 AM)  O2 Flow:  Room air CC: Body Rash Is Patient Diabetic? Yes Did you bring your meter with you today? No Pain Assessment Patient in pain? no      Comments Pt states saw dr. Jonny Ruiz on 09/25/10 for rash was rx prednidone, and given a shot, but rash has came back. Also pt has letter from insurance company need prior auth on Carisoprodol   Primary Care Provider:  [name] MD  CC:  Body Rash.  History of Present Illness: here for recurrent rash - seen 09/26/11 for same:  Has rash today, non hive like to the torso - several small red erythema areas < 5 mm , also prox extremities without swelilng, tongue swelling, sob/doe or wheezing.  , overall mild for 1 wk.  No prior hx.  No recent med changes or exposures  given depomed and pred pak - much improved for 3 weeks same symptoms now - itch, redness ?precipitated by fiberglass - working on floor replacment/carpet in his home no fever, no sob or wheeze not improved with otc creams/meds for itch would like to see dermatologist  Current Medications (verified): 1)  Ambien 10 Mg  Tabs (Zolpidem Tartrate) .[name].. 1 By Mouth At Bedtime As Needed 2)  Tizanidine Hcl 4 Mg Tabs (Tizanidine Hcl) .[name].. 1 By Mouth Three Times A Day As Needed 3)  Proventil Hfa 108 (90 Base) Mcg/act Aers (Albuterol Sulfate) .... 2 Puffs Qid Prn 4)  Ibuprofen 800 Mg Tabs (Ibuprofen) .[name].. 1 By Mouth Two Times A Day 5)  Ecotrin Low Strength 81 Mg  Tbec (Aspirin) .[name].. 1po Qd 6)  Lisinopril-Hydrochlorothiazide 20-25 Mg Tabs (Lisinopril-Hydrochlorothiazide) .[name].. 1po Once Daily 7)   Nuvigil 150 Mg  Tabs (Armodafinil) .[name].. 1 By Mouth Once Daily 8)  Lipitor 20 Mg Tabs (Atorvastatin Calcium) .[name].. 1po Once Daily 9)  Sertraline Hcl 100 Mg  Tabs (Sertraline Hcl) .... 2 By Mouth Once Daily 10)  Trazodone Hcl 100 Mg Tabs (Trazodone Hcl) .... 1/2 By Mouth At Bedtime As Needed 11)  Alprazolam 1 Mg Xr24h-Tab (Alprazolam) .[name].. 1po Once Daily - To Fill Sep 20, 2010 12)  Prednisone 10 Mg Tabs (Prednisone) .... 3po Qd For 3days, Then 2po Qd For 3days, Then 1po Qd For 3days, Then Stop  Allergies (verified): 1)  * Simvastatin  Past History:  Past Medical History: Depression Hyperlipidemia Hypertension OCD  Morbid Obesity Plantar Fascitis, bilateral Insomnia Allergic rhinitis Anxiety GERD Low back pain Diabetes mellitus, type II Benign prostatic hypertrophy Dyshagia OSA Nephrolithiasis, hx of Asthma Fibromyalgia  Review of Systems  The patient denies weight gain, chest pain, syncope, dyspnea on exertion, peripheral edema, and abdominal pain.    Physical Exam  General:  alert and overweight-appearing.   Lungs:  normal respiratory effort and normal breath sounds.   Heart:  normal rate and regular rhythm.   Skin:  nontender rash with mult < 5 mm ithcy areas nonraised erythema affecting  extremities, torso, neck   Impression & Recommendations:  Problem # 1:  RASH-NONVESICULAR (ICD-782.1)  same as 09/2010: allergic type but not hive like, doubt med related for repeat depomedrol IM , and predpack for home refer to derm Continue all other previous medications as before this visit   Orders: Prescription Created Electronically [phone]) Dermatology Referral (Derma) Depo- Medrol 80mg  (J1040) Depo- Medrol 40mg  (J1030) Admin of Therapeutic Inj  intramuscular or subcutaneous (81191)  Problem # 2:  FIBROMYALGIA (ICD-729.1)  His updated medication list for this problem includes:    Tizanidine Hcl 4 Mg Tabs (Tizanidine hcl) .[name]... 1 by mouth three times a day as needed     Ibuprofen 800 Mg Tabs (Ibuprofen) .[name]... 1 by mouth two times a day    Ecotrin Low Strength 81 Mg Tbec (Aspirin) .[name]... 1po qd  tried change from soma to the zanaflex as per Francine Graven - wants to go back on soma -  defer to PCP - pt has soma supply to get through end of Jan  Complete Medication List: 1)  Ambien 10 Mg Tabs (Zolpidem tartrate) .[name].. 1 by mouth at bedtime as needed 2)  Tizanidine Hcl 4 Mg Tabs (Tizanidine hcl) .[name].. 1 by mouth three times a day as needed 3)  Proventil Hfa 108 (90 Base) Mcg/act Aers (Albuterol sulfate) .... 2 puffs qid prn 4)  Ibuprofen 800 Mg Tabs (Ibuprofen) .[name].. 1 by mouth two times a day 5)  Ecotrin Low Strength 81 Mg Tbec (Aspirin) .[name].. 1po qd 6)  Lisinopril-hydrochlorothiazide 20-25 Mg Tabs (Lisinopril-hydrochlorothiazide) .[name].. 1po once daily 7)  Nuvigil 150 Mg Tabs (Armodafinil) .[name].. 1 by mouth once daily 8)  Lipitor 20 Mg Tabs (Atorvastatin calcium) .[name].. 1po once daily 9)  Sertraline Hcl 100 Mg Tabs (Sertraline hcl) .... 2 by mouth once daily 10)  Trazodone Hcl 100 Mg Tabs (Trazodone hcl) .... 1/2 by mouth at bedtime as needed 11)  Alprazolam 1 Mg Xr24h-tab (Alprazolam) .[name].. 1po once daily - to fill Sep 20, 2010 12)  Prednisone (pak) 5 Mg Tabs (Prednisone) .... As directed x 12days  Patient Instructions: 1)  it was good to see you today. 2)  repeat shot and pred pak today for rash 3)  we'll make referral to dermatology for this recurring rash/itch. Our office will contact you regarding this appointment once made.  4)  will let dr. Jonny Ruiz work on "soma" authorization for you as requested 5)  Please schedule a follow-up appointment in 3 months with dr. Jonny Ruiz (or as scheduled), call sooner if problems.  Prescriptions: PREDNISONE (PAK) 5 MG TABS (PREDNISONE) as directed x 12days  #1 pak x 0   Entered and Authorized by:   Newt Lukes MD   Signed by:   Newt Lukes MD on 11/01/2010   Method used:   [facility]        [facility] [phone][facility]  (retail)       [address] [place]  [address]       Ph: [phone] or [phone]       Fax: [phone]   RxID:   [id number]    Medication Administration  Injection # 1:    Medication: Depo- Medrol 80mg     Diagnosis: RASH-NONVESICULAR (ICD-782.1)    Route: IM    Site: LUOQ gluteus    Exp Date: 04/2013    Lot #: [id number]    Mfr: [employer]    Comments: Gave total of 120mg   Patient tolerated injection without complications    Given by: Orlan Leavens RMA (November 01, 2010 1:42 PM)  Injection # 2:    Medication: Depo- Medrol 40mg     Diagnosis: RASH-NONVESICULAR (ICD-782.1)  Orders Added: 1)  Est. Patient Level IV [16109] 2)  Prescription Created Electronically [G8553] 3)  Dermatology Referral [Derma] 4)  Depo- Medrol 80mg  [J1040] 5)  Depo- Medrol 40mg  [J1030] 6)  Admin of Therapeutic Inj  intramuscular or subcutaneous [60454]

## 2010-11-17 ENCOUNTER — Encounter: Payer: Self-pay | Admitting: Internal Medicine

## 2010-11-18 ENCOUNTER — Encounter: Payer: Self-pay | Admitting: Internal Medicine

## 2010-11-24 ENCOUNTER — Encounter: Payer: Self-pay | Admitting: Internal Medicine

## 2010-11-30 NOTE — Progress Notes (Signed)
Summary: PA-Carisoprodol  Phone Note From Pharmacy   Summary of Call: PA-Carisoprodol, called [employer] @ [phone], awaiting form. [name]  November 16, 2010 12:44 PM Form to dr [name] to complete. [name]  November 16, 2010 2:49 PM PA faxed to [facility] @ [phone], awaitng approval. [name]  November 17, 2010 10:19 AM PA approved   until 11/18/11, pt aware .   Initial call taken by: [name],  November 20, 2010 9:09 AM

## 2010-11-30 NOTE — Medication Information (Signed)
Summary: Prior autho & approved for Carisoprodol/[employer]  Prior autho & approved for Carisoprodol/[employer]   Imported By: Sherian Rein 11/23/2010 08:31:38  _____________________________________________________________________  External Attachment:    Type:   Image     Comment:   External Document

## 2010-12-06 NOTE — Consult Note (Signed)
Summary: Leigh Aurora MD  R. June Leap MD   Imported By: Sherian Rein 12/01/2010 12:56:39  _____________________________________________________________________  External Attachment:    Type:   Image     Comment:   External Document

## 2010-12-12 NOTE — Consult Note (Signed)
Summary: Leigh Aurora MD  R June Leap MD   Imported By: Lester Southchase 12/08/2010 09:57:27  _____________________________________________________________________  External Attachment:    Type:   Image     Comment:   External Document

## 2010-12-26 ENCOUNTER — Encounter: Payer: Self-pay | Admitting: Internal Medicine

## 2010-12-26 ENCOUNTER — Ambulatory Visit (INDEPENDENT_AMBULATORY_CARE_PROVIDER_SITE_OTHER): Payer: Medicare HMO | Admitting: Internal Medicine

## 2010-12-26 DIAGNOSIS — N35919 Unspecified urethral stricture, male, unspecified site: Secondary | ICD-10-CM

## 2010-12-26 DIAGNOSIS — I959 Hypotension, unspecified: Secondary | ICD-10-CM

## 2010-12-26 DIAGNOSIS — J019 Acute sinusitis, unspecified: Secondary | ICD-10-CM | POA: Insufficient documentation

## 2010-12-26 DIAGNOSIS — E119 Type 2 diabetes mellitus without complications: Secondary | ICD-10-CM

## 2011-01-02 NOTE — Assessment & Plan Note (Signed)
Summary: HEADACHE/ EARACHE / NWS   Vital Signs:  Patient profile:   55 year old male Height:      70 inches Weight:      308.13 pounds BMI:     44.37 O2 Sat:      92 % on Room air Temp:     97.4 degrees F oral Pulse rate:   69 / minute BP sitting:   102 / 62  (left arm) Cuff size:   large  Vitals Entered By: Zella Ball Ewing CMA Duncan Dull) (December 26, 2010 11:17 AM)  O2 Flow:  Room air CC: Headaches, right ear ache and throat pain, dizziness/RE   Primary Care Provider:  [name] MD  CC:  Headaches, right ear ache and throat pain, and dizziness/RE.  History of Present Illness: here with acute onset illness with 3 days onset mild to mod right facial pain, pressure , fever , general weakness and malaise and greenish d/c, with swelling and tender to areas of the right templa and post area behind the right ear;  Pt denies CP, worsening sob, doe, wheezing, orthopnea, pnd, worsening LE edema, palps, dizziness or syncope   Pt denies new neuro symptoms such as headache, facial or extremity weakness  Pt denies polydipsia, polyuria, or low sugar symptoms such as shakiness improved with eating.  Overall good compliance with meds, trying to follow low chol, DM diet, wt stable, little excercise however  CBG's in the lower 100's.  No recetn wt loss, night sweats, loss of appetite or other constitutional symptoms except with above.  Also with diffuse myalgias, dizziness, tremulousness to the hands.  Also mentions current ambien just not working for keeping him asleep all night.  Denies worsening depressive symptoms, suicidal ideation, or panic. though does have ongoing anxiety.  Also mentions still hvaing trouble wtih urinating, has known urethral stricture, hx of gonorrhea twice as young man, was seen at [facility] but did not followup there for any procedure, now has insurance and would like referral to local urology.  No other dysuria, lower abd pain, worsening back pain, n/v.   Problems Prior to Update: 1)   Unspecified Urethral Stricture  (ICD-598.9) 2)  Unspecified Hypotension  (ICD-458.9) 3)  Sinusitis- Acute-nos  (ICD-461.9) 4)  Rash-nonvesicular  (ICD-782.1) 5)  Facial Rash  (ICD-782.1) 6)  Shoulder Pain, Left  (ICD-719.41) 7)  Chronic Pain Syndrome  (ICD-338.4) 8)  Preventive Health Care  (ICD-V70.0) 9)  Rash-nonvesicular  (ICD-782.1) 10)  Orchitis  (ICD-604.90) 11)  Frequency, Urinary  (ICD-788.41) 12)  Fibromyalgia  (ICD-729.1) 13)  Asthma  (ICD-493.90) 14)  Nephrolithiasis, Hx of  (ICD-V13.01) 15)  Polyarthralgia  (ICD-719.49) 16)  Peripheral Edema  (ICD-782.3) 17)  Sleep Apnea, Obstructive  (ICD-327.23) 18)  Dyskinesia, Esophagus  (ICD-530.5) 19)  Benign Prostatic Hypertrophy  (ICD-600.00) 20)  Diabetes Mellitus, Type II  (ICD-250.00) 21)  Low Back Pain  (ICD-724.2) 22)  Gerd  (ICD-530.81) 23)  Anxiety  (ICD-300.00) 24)  Allergic Rhinitis  (ICD-477.9) 25)  Insomnia  (ICD-780.52) 26)  Plantar Fasciitis, Bilateral  (ICD-728.71) 27)  Morbid Obesity  (ICD-278.01) 28)  Obsessive-compulsive Disorder  (ICD-300.3) 29)  Hypertension  (ICD-401.9) 30)  Hyperlipidemia  (ICD-272.4) 31)  Depression  (ICD-311)  Medications Prior to Update: 1)  Ambien 10 Mg  Tabs (Zolpidem Tartrate) .[name].. 1 By Mouth At Bedtime As Needed 2)  Carisoprodol 350 Mg Tabs (Carisoprodol) .[name].. 1 By Mouth Three Times A Day As Needed Cramps 3)  Proventil Hfa 108 (90 Base) Mcg/act Aers (Albuterol  Sulfate) .... 2 Puffs Qid Prn 4)  Ibuprofen 800 Mg Tabs (Ibuprofen) .[name].. 1 By Mouth Two Times A Day 5)  Ecotrin Low Strength 81 Mg  Tbec (Aspirin) .[name].. 1po Qd 6)  Lisinopril-Hydrochlorothiazide 20-25 Mg Tabs (Lisinopril-Hydrochlorothiazide) .[name].. 1po Once Daily 7)  Nuvigil 150 Mg  Tabs (Armodafinil) .[name].. 1 By Mouth Once Daily 8)  Lipitor 20 Mg Tabs (Atorvastatin Calcium) .[name].. 1po Once Daily 9)  Sertraline Hcl 100 Mg  Tabs (Sertraline Hcl) .... 2 By Mouth Once Daily 10)  Trazodone Hcl 100 Mg Tabs (Trazodone Hcl) .... 1/2 By  Mouth At Bedtime As Needed 11)  Alprazolam 1 Mg Xr24h-Tab (Alprazolam) .[name].. 1po Once Daily - To Fill Sep 20, 2010  Current Medications (verified): 1)  Zolpidem Tartrate 12.5 Mg Cr-Tabs (Zolpidem Tartrate) .[name].. 1po At Bedtime As Needed 2)  Carisoprodol 350 Mg Tabs (Carisoprodol) .[name].. 1 By Mouth Three Times A Day As Needed Cramps 3)  Proventil Hfa 108 (90 Base) Mcg/act Aers (Albuterol Sulfate) .... 2 Puffs Qid Prn 4)  Ibuprofen 800 Mg Tabs (Ibuprofen) .[name].. 1 By Mouth Two Times A Day 5)  Ecotrin Low Strength 81 Mg  Tbec (Aspirin) .[name].. 1po Qd 6)  Lisinopril-Hydrochlorothiazide 20-25 Mg Tabs (Lisinopril-Hydrochlorothiazide) .[name].. 1po Once Daily 7)  Nuvigil 150 Mg  Tabs (Armodafinil) .[name].. 1 By Mouth Once Daily 8)  Lipitor 20 Mg Tabs (Atorvastatin Calcium) .[name].. 1po Once Daily 9)  Sertraline Hcl 100 Mg  Tabs (Sertraline Hcl) .... 2 By Mouth Once Daily 10)  Trazodone Hcl 100 Mg Tabs (Trazodone Hcl) .... 1/2 By Mouth At Bedtime As Needed 11)  Alprazolam 1 Mg Xr24h-Tab (Alprazolam) .[name].. 1po Once Daily - To Fill Sep 20, 2010 12)  Hydroxyzine Hcl 25 Mg Tabs (Hydroxyzine Hcl) .[name].. 1-2 Tablets  By Mouth Every 6 Hours As Needed For Itching 13)  Triamcinolone Acetonide 0.1 % Crea (Triamcinolone Acetonide) .... Apply To Affected Area Two Times A Day 14)  Doxycycline Hyclate 100 Mg Caps (Doxycycline Hyclate) .[name].. 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 , mild ill , fatigued  Head:  normocephalic and atraumatic.   Eyes:  vision grossly intact, pupils equal, and pupils round.   Ears:  R ear normal and L ear normal.  , canals clear , right maxillary sinus moderate tender, as well as mild swollen tender right temple area iwhtout erythema or drainage, and mild tender to area of rigtht mastoid area aga9in without sweling or erythema or drainage Nose:  nasal dischargemucosal pallor and mucosal edema.   Mouth:  pharyngeal erythema and fair dentition.   Neck:  supple and cervical lymphadenopathy.   Lungs:  normal respiratory effort, R decreased breath sounds, and L decreased breath sounds.   Heart:  normal rate and regular rhythm.   Msk:  no joint tenderness and no joint swelling.   Extremities:  no edema, no  erythema  Skin:  color normal and no rashes.   Psych:  dysphoric affect and moderately anxious.     Impression & Recommendations:  Problem # 1:  SINUSITIS- ACUTE-NOS (ICD-461.9)  His updated medication list for this problem includes:    Doxycycline Hyclate 100 Mg Caps (Doxycycline hyclate) .[name]... 1 by mouth two times a day treat as above, f/u any worsening signs or symptoms   Instructed on treatment. Call if symptoms persist or worsen.   Problem # 2:  UNSPECIFIED HYPOTENSION (ICD-458.9) likely accounts for hte fatigue as well as the acute illness  -  for 1/2 BP med for one wk, then resume full dose if feeling better with above; to f/u BP at home and next visit  Problem # 3:  UNSPECIFIED URETHRAL STRICTURE (ICD-598.9)  will need urology referral, delcines UA today  Orders: Urology Referral (Urology)  Problem # 4:  DIABETES MELLITUS, TYPE II (ICD-250.00)  His updated medication list for this problem includes:    Ecotrin Low Strength 81 Mg  Tbec (Aspirin) .[name]... 1po qd    Lisinopril-hydrochlorothiazide 20-25 Mg Tabs (Lisinopril-hydrochlorothiazide) .[name]... 1po once daily  Labs Reviewed: Creat: 0.7 (09/25/2010)    Reviewed HgBA1c results: 6.7 (09/25/2010)  6.7 (03/21/2010) stable overall by hx and exam, ok to continue meds/tx as is , Pt to cont DM diet, excercise, wt control efforts; to check labs next visit  - declines today  Problem # 5:  INSOMNIA (ICD-780.52)  His updated medication list for this problem includes:    Zolpidem Tartrate 12.5 Mg Cr-tabs (Zolpidem tartrate) .[name]... 1po at bedtime as needed treat as above, f/u any worsening signs or symptoms   Complete Medication List: 1)  Zolpidem Tartrate 12.5 Mg Cr-tabs (Zolpidem tartrate) .[name].. 1po at bedtime as needed 2)  Carisoprodol 350 Mg Tabs (Carisoprodol) .[name].. 1 by mouth three times a day as needed cramps 3)  Proventil Hfa 108 (90 Base) Mcg/act Aers (Albuterol sulfate) .... 2 puffs qid prn 4)  Ibuprofen 800 Mg Tabs (Ibuprofen) .[name].. 1 by mouth two times a day 5)  Ecotrin Low Strength 81 Mg Tbec (Aspirin) .[name].. 1po qd 6)  Lisinopril-hydrochlorothiazide 20-25 Mg Tabs (Lisinopril-hydrochlorothiazide) .[name].. 1po once daily 7)  Nuvigil 150 Mg Tabs (Armodafinil) .[name].. 1 by mouth once daily 8)  Lipitor 20 Mg Tabs (Atorvastatin calcium) .[name].. 1po once daily 9)  Sertraline Hcl 100 Mg Tabs (Sertraline hcl) .... 2 by mouth once daily 10)  Trazodone Hcl 100 Mg Tabs (Trazodone hcl) .... 1/2 by mouth at bedtime as needed 11)  Alprazolam 1 Mg Xr24h-tab (Alprazolam) .[name].. 1po once daily - to fill Sep 20, 2010 12)  Hydroxyzine Hcl 25 Mg Tabs (Hydroxyzine hcl) .[name].. 1-2 tablets  by mouth every 6 hours as needed for itching 13)  Triamcinolone Acetonide 0.1 % Crea (Triamcinolone acetonide) .... Apply to affected area two times a day 14)  Doxycycline Hyclate 100 Mg Caps (Doxycycline hyclate) .[name].. 1 by mouth two times a day  Patient Instructions: 1)  Please take all new medications as prescribed 2)   Continue all previous medications as before this visit , except take HALF of the lisinopril/HCT per day for one week 3)  Please take all new medications as prescribed - the longer acting ambien (generic) 4)  You will be contacted about the referral(s) to: urology 5)  Please schedule a follow-up appointment in 2 months for CPX with labs: and: 6)  HbgA1C prior to visit, ICD-9: 250.02 7)  Urine Microalbumin prior to visit, ICD-9: Prescriptions: DOXYCYCLINE  HYCLATE 100 MG CAPS (DOXYCYCLINE HYCLATE) 1 by mouth two times a day  #20 x 0   Entered and Authorized by:   [name] MD   Signed by:   [name] MD on 12/26/2010   Method used:   Print then Give to Patient   RxID:   [id number] ZOLPIDEM TARTRATE 12.5 MG CR-TABS (ZOLPIDEM TARTRATE) 1po at bedtime as needed  #30 x 2   Entered and Authorized by:   [name] MD   Signed by:   [name] MD on 12/26/2010   Method used:   Print then Give to Patient   RxID:   [id number]    Orders Added: 1)  Urology Referral [Urology] 2)  Est. Patient Level IV [36644]

## 2011-02-08 ENCOUNTER — Other Ambulatory Visit: Payer: Self-pay

## 2011-02-08 MED ORDER — ARMODAFINIL 150 MG PO TABS: 150.0000 mg | ORAL_TABLET | Freq: Every day | ORAL | Status: AC

## 2011-02-08 NOTE — Telephone Encounter (Signed)
Faxed hardcopy to pharmacy. 

## 2011-02-08 NOTE — Telephone Encounter (Signed)
Patient is requesting a prescription for Nuvigil. The patient stated that this medication was prescribed by another MD July of 2011. Advise please.

## 2011-02-08 NOTE — Telephone Encounter (Signed)
Done hardcopy to dahlia/LIM B  

## 2011-02-19 ENCOUNTER — Other Ambulatory Visit: Payer: Self-pay

## 2011-02-19 MED ORDER — CARISOPRODOL 350 MG PO TABS: 350.0000 mg | ORAL_TABLET | Freq: Three times a day (TID) | ORAL | Status: DC | PRN

## 2011-02-19 NOTE — Telephone Encounter (Signed)
Faxed hardcopy to pharmacy. 

## 2011-02-19 NOTE — Telephone Encounter (Signed)
Done per emr 

## 2011-02-19 NOTE — Telephone Encounter (Signed)
Pharmacy requesting refill on Carisoprodol 350 last refill 11/06/2010 #90 with 3 refills and last OV 09/2010.

## 2011-02-27 NOTE — Procedures (Signed)
NAME:  [name], [name] NO.:  [id number]   MEDICAL RECORD NO.:  [id number]          PATIENT TYPE:  OUT   LOCATION:  SLEEP CENTER                 FACILITY:  [facility]   PHYSICIAN:  Barbaraann Share, MD,FCCPDATE OF BIRTH:  Feb 23, 1955   DATE OF STUDY:  07/29/2007                            NOCTURNAL POLYSOMNOGRAM   REFERRING PHYSICIAN:  Barbaraann Share, MD,FCCP   REFERRING PHYSICIAN:  Barbaraann Share, M.D.   INDICATION FOR STUDY:  Hypersomnia with sleep apnea.   EPWORTH SLEEPINESS SCORE:  15.   MEDICATIONS:   SLEEP ARCHITECTURE:  The patient had a total sleep time of 235 minutes  with no slow wave sleep and only 29 minutes of REM.  Sleep onset latency  was normal, and REM latency was normal as well. Sleep efficiency was  very poor at 63%.   RESPIRATORY DATA:  The patient was found to have 138 obstructive  hypopneas and 1 apnea or an apnea/hypopnea index of 36 events per hour.  The events occurred in all body positions, and there was loud snoring  noted throughout.   OXYGEN DATA:  There was oxygen desaturation as low as 73% with the  patient's obstructive events.   CARDIAC DATA:  Frequent PVCs were noted throughout.   MOVEMENT-PARASOMNIA:  None.   IMPRESSIONS-RECOMMENDATIONS:  1. Moderate to severe obstructive sleep apnea/hypopnea syndrome with      an apnea/hypopnea index of 36 events per hour and oxygen      desaturation as low as 73%. Treatment for this degree of sleep      apnea should focus      primarily on weight loss as well as CPAP.  2. Frequent premature ventricular contractions noted throughout.      Barbaraann Share, MD,FCCP  Diplomate, American Board of Sleep  Medicine  Electronically Signed     [facility]/MEDQ  D:  08/05/2007 07:03:48  T:  08/05/2007 15:15:16  Job:  [id number]

## 2011-02-27 NOTE — Assessment & Plan Note (Signed)
[facility]                             PULMONARY OFFICE NOTE   TAEGAN, STANDAGE                       MRN:          [id number]  DATE:07/11/2007                            DOB:          21-Jan-1956    HISTORY OF PRESENT ILLNESS:  The patient is a 55 year old white male who  I have been asked to see for difficulty sleeping.  The patient has great  difficulty with sleep onset as well as sleep maintenance, and admits to  severe daytime sleepiness with periods of inactivity.  He will typically  go to bed at 10 p.m., and will typically fall asleep in 10-15 minutes,  but sometimes it may be hours.  He typically takes Ambien, Xanax and  Klonopin prior to going to bed.  He will often awaken in approximately 2  hours and it may take minutes to hours to get back to sleep.  He will  frequently go out to his recliner and will actually fall asleep and do  better in that position.  The patient has been noted to have loud  snoring and gasping arousals.  He does have severe chronic back pain  that bothers him a lot at night and continues his awakenings.  The  patient also has abnormal sensations in his legs in the evenings and  during the day that is improved with movement.  He does not have classic  restless leg syndrome symptoms, nor is he sure if he kicks a lot during  the night, he just has difficulty getting comfortable.  During the day  the patient typically drinks 1-2 cups of coffee q. a.m. with occasional  caffeinated tea.  He also drinks an occasional decaffeinated Diet Coke,  but overall caffeine consumption is not overly impressive.   PAST MEDICAL HISTORY:  1. Hypertension.  2. History of heart rhythm issues of unknown type.  3. History of asthma.  4. History of dyslipidemia.  5. History of allergic rhinitis.  6. History of kidney stones.  7. Chronic back pain.   CURRENT MEDICATIONS:  1. Alprazolam 1 mg daily.  2. Prilosec 20 mg daily.  3. Kadian  60 mg daily.  4. Lisinopril 10 mg daily.  5. Zocor 40 mg daily.  6. Flomax 0.4 mg nightly.  7. Ambien CR 12.5 mg nightly.  8. Klonopin 2 mg nightly.  9. Carisoprodol 350 mg 1/2 tablet nightly.  10.Hydrocodone 75/350 one to three tabs daily.   The patient has no known drug allergies.   SOCIAL HISTORY:  He is married.  He has a history of smoking 2 packs per  day for 40 years and continues to do so.   FAMILY HISTORY:  Remarkable for mother having emphysema and asthma as  well as cancer of unknown type.   REVIEW OF SYSTEMS:  As per history of present illness.  Also, see  patient intake form documented in the chart.   PHYSICAL EXAMINATION:  IN GENERAL:  He is a morbidly obese white male in  no acute distress.  Blood pressure 114/70, pulse 78, temperature is 97, weight is 315  pounds.  He is 5 feet 11 inches tall.  O2 saturation on room air is 93%.  HEENT:  Pupils are equal, round, and reactive to light and  accommodation, extraocular muscles are intact.  Nares are very dry with  significant turbinate hypertrophy and narrowing.  Oropharynx does show  significant elongation of the soft palate and uvula.  NECK:  Supple without JVD or lymphadenopathy.  There is no palpable  thyromegaly.  CHEST:  Totally clear.  CARDIAC EXAM:  Reveals regular rate and rhythm with no murmurs, rubs, or  gallops.  ABDOMEN:  Soft, nontender, with good bowel sounds.  GENITAL/RECTAL/BREAST EXAM:  Not done and not indicated.  LOWER EXTREMITIES:  Showed trace edema, pulses are intact distally.  NEUROLOGICALLY:  Alert and oriented with no obvious motor deficits.   IMPRESSION:  1. Probable obstructive sleep apnea.  The patient is morbidly obese.      Has very abnormal upper airway anatomy.  Has snoring and choking      arousals with daytime sleepiness, and I have a suspicion that he      may have sleep state misperception in that he sleeps longer than he      thinks he does, and that sleep apnea is awakening  him very      frequently.  At this point in time I think he would benefit from      nocturnal polysomnography.  2. Significant sleep disruption during the night that I think is      multifactorial.  This includes chronic pain, poor sleep hygiene,      questionable depression, and an element of pathophysiologic      insomnia.  Part of the patient's sleepiness during the day is from      his medications.  I have had a long discussion with him about each      of these issues, and I have given some ideas of how to deal with      some of them.  Others will have to be taken care of by his chronic      pain physician, and possibly psychiatric evaluation for depression.   PLAN:  1. Work on weight loss.  2. Schedule nocturnal polysomnogram.  3. The patient would certainly benefit from better pain control.  4. The patient will followup in 4 weeks or sooner if there are      problems.     Barbaraann Share, MD,FCCP  Electronically Signed   [facility]/MedQ  DD: 07/11/2007  DT: 07/12/2007  Job #: [id number]   cc:   [name], MD

## 2011-03-02 NOTE — Op Note (Signed)
NAME:  [name], [name] NO.:  [id number]   MEDICAL RECORD NO.:  [id number]                   PATIENT TYPE:  AMB   LOCATION:  [facility]                                  FACILITY:  [facility]   PHYSICIAN:  Anselm Pancoast. Zachery Dakins, M.D.          DATE OF BIRTH:  15-Oct-1956   DATE OF PROCEDURE:  06/30/2004  DATE OF DISCHARGE:  06/30/2004                                 OPERATIVE REPORT   PREOPERATIVE DIAGNOSIS:  Giant chronic sebaceous cyst back, and a smaller,  mildly inflamed, sebaceous cyst back.   OPERATION:  Excision of two sebaceous cysts, one of them was greater than 10  cm in diameter.   ANESTHESIA:  Local.   HISTORY:  [name] is a 55 year old male who for about 30 years has had  a progressive swelling in the mid back, sort of just below the scapula, that  has just been chronically observed.  He has also had an infected cyst a  little bit to the left and lower that was I&D'd or attempted to incise by  Dr. Debby Bud several years ago.  The patient now is having significant back  symptoms and I saw him in the office and recommended that we remove this  sebaceous cyst.  He has got a little small one up higher and then he has  also had an MRI that does show a bulging disk in the thoracic area.  He is  now for excision of this large sebaceous cyst, and there is a little bit of  induration around the larger cyst at this time.  The patient was started on  Keflex last evening.  I also gave him two 5 mg Valium tablets to kind of  assist in the excision.   The patient was positioned on the OR table, rolled up under his chest in his  comfortable position and then the area was prepped with Betadine surgical  scrub and solution and draped in a sterile manner.  Next, I basically  infiltrated the skin right over the area and then used basically a ring of  Xylocaine with adrenaline circumferentially for excising this.  I made a  transverse incision, sharp dissection, kind  of got into the subcuticular  cyst plane and then kind of worked circumferentially __________ to get it so  we could actually elevate this into the wound.  I was able to remove it in  toto and it was there was a little second pocket kind of going inferior that  I expect is part of all of the same cyst area, and the little bleeders, a  few, were sutured with 3-0 Vicryl, some were cauterized.  The cyst then  completely excised.  I then infiltrated with Xylocaine the little area that  had been previously excised, this is not a continuation but is close by, and  a little transverse incision removed this cyst and the old sac through  approximately  probably a 3 cm incision.  I elected to close the smaller cyst  with 3-0 nylon.  The bigger one, I did close it with 3-0 Vicryl, placed a  0.5 inch Penrose drain superiorly inferiorly and then closed the skin with  interrupted simple sutures of 3-0 nylon and will see him in the office on  Tuesday to remove the drain.  He is aware that if he would have any  significant bleeding that he should let us know and to complete the Keflex  he is on and I may end up having to open this up for complete healing, but  hopefully we can get it to close up.  This is probably the largest sebaceous  cyst in my career I have removed, and the patient said it has been present  for more than 30 years.  Next, the little small lesion up higher he wanted  to wait on as he was just getting tired of the position and it is not  acutely inflamed, but it could be removed in the office at a later date if  the patient desires.      WJW/MEDQ  D:  06/30/2004  T:  07/02/2004  Job:  [id number]

## 2011-03-02 NOTE — Discharge Summary (Signed)
[facility]  Patient:    [name], [name]                       MRN: [id number] Adm. Date:  [id number] Disc. Date: [id number] Attending:  Mirian Mo Dictator:   Tereso Newcomer, P.A. CC:         [name], M.D. [facility]   Discharge Summary  DATE OF BIRTH:  January 09, 1956.  DISCHARGE DIAGNOSES 1. Atrial fibrillation with rapid ventricular rate, with conversion to normal    sinus rhythm prior to discharge. 2. Hypertension. 3. Alcohol abuse. 4. Obesity. 5. Obsessive-compulsive disorder. 6. Peptic ulcer disease. 7. Hypokalemia on admission, corrected prior to discharge.  ADMISSION HISTORY:  This 55 year old male came to the emergency room after experiencing a fall.  He had an episode of shortness of breath, sweatiness and subsequent dizziness and fainting, striking the front part of his head.  In the emergency room, he was found to be in atrial fibrillation with rapid ventricular rate.  He had no prior history of this.  He had drank about a liter of red wine on the night of admission.  In the emergency room, he was in no acute distress.  Vital signs revealed blood pressure of 110/69, pulse 130 to 140 and irregular.  Initial troponin and CPK negative.  EKG showed no acute changes.  Head CT showed no fracture and no bleed.  Alcohol level was 13.  ALLERGIES:  CODEINE.  PHYSICAL EXAMINATION:  Initial physical exam revealed a blood pressure of 130/70.  NECK:  No JVD.  No bruits.  HEART:  Soft S1 and S2.  LUNGS:  Clear to auscultation.  ABDOMEN:  Protuberant with good bowel sounds.  EXTREMITIES:  No cyanosis, clubbing or edema.  HOSPITAL COURSE:  Patient was admitted and placed on a Cardizem drip. Troponin was repeated in the morning and this was also negative at 0.03. Patient still had a rapid rate in the morning of October 26, 2000.  Digoxin was added and he received one dose of 0.25 mg.  Around 3 oclock on October 26, 2000, the patient converted to  normal sinus rhythm with a heart rate of 65.  On the morning of October 27, 2000, he remained in sinus rhythm. His heart rate did drop into the 40s to 50s.  On the morning of October 27, 2000, it was felt that he was stable enough for discharge to home. He would be sent home on Cardizem CD 120 mg a day and no digoxin.  His Atacand was discontinued.  LABORATORY DATA:  White count 11,900, hemoglobin 16.2, hematocrit 44.8, platelet count 254,000.  Sodium 139, potassium 4.2, chloride 106, CO2 28, glucose 101, BUN 17, creatinine 1, calcium 8.9, total protein 7.2, albumin 4.6, AST 34, ALT 41, alkaline phosphatase 74, total bilirubin 0.6.  CK 474, CK-MB 6.1, troponin I of 0.01, troponin I #2 0.03.  T4 8.0.  TSH 1.961; repeat 2.012.  T3 189.5.  Alcohol level 13.  DISCHARGE MEDICATIONS 1. Cardizem CD 120 mg q.d. 2. Prozac 20 mg q.d. 3. Prilosec 20 mg q.d.  ACTIVITY:  Limit his strenuous activity.  DIET:  Low fat.  SPECIAL DISCHARGE INSTRUCTIONS:  Patient has been advised to discontinue tobacco use and alcohol use.  He has also been advised to stop taking his Atacand.  FOLLOWUP:  He should follow up with Dr. [name] as needed and he was advised to call office to schedule an echocardiogram and  stress test and to follow up with Dr. Jesse Sans. Wall. DD:  11/08/00 TD:  11/08/00 Job: [id number]

## 2011-03-02 NOTE — H&P (Signed)
[facility] [place]  Patient:    [name], [name]                       MRN: [id number] Adm. Date:  [id number] Attending:  Harrel Carina CC:         [name], M.D. [facility]   History and Physical  CHIEF COMPLAINT:  Fainting spell and fluttering.  HISTORY OF PRESENT ILLNESS:  Mr. Mancil is a pleasant 55 year old married white male who presented with an episode of shortness of breath, sweatiness, with subsequent dizziness and fainting, striking the front part of his head.  He came to the emergency room where he was found to be in atrial fibrillation with a rapid ventricular rate.  He has no previous history of this.  Tonight, he drank about a liter of red wine.  He smokes two packs of cigarettes a day.  He is overweight and has had hypertension for two years. He also has an obsessive-compulsive disorder and recently cut back on his Prozac.  In the emergency room, he was in no acute distress.  His vital signs showed a blood pressure of 110/69 with a pulse rate of 130-140 and irregular. ECG showed no acute changes.  Initial troponin and CPK were negative for myocardial infarction.  O2 saturation was 94%.  Chest x-ray showed no acute disease.  Head CT showed no fracture and no bleed.  All his laboratory data was unremarkable except for a slightly elevated white count and a potassium of 3.5.  He is now being admitted to telemetry and hopefully cardioversion.  PAST MEDICAL HISTORY/MEDICATIONS:  Significant for obsessive-compulsive disorder.  He has been on trazodone in the past and is currently on Prozac, unknown dose.  He also has a history of hypertension and is on Atacand 2 mg a day.  He has a history of peptic ulcer disease and on Prilosec 30 mg a day.  SOCIAL HISTORY:  He is married.  He smokes a pack-and-a-half to two packs a day.  He has frequent alcohol intake used in the form of wine.  He had a liter of wine tonight.  Alcohol level is  13.  ALLERGIES:  CODEINE.  OTHER HISTORY:  He also gives a history of sleep apnea.  He does take in a fair amount of caffeine.  REVIEW OF SYSTEMS:  Otherwise unremarkable.  PHYSICAL EXAMINATION:  VITAL SIGNS:  Blood pressure is now 130/70.  His pulse is running between 100 and 120 on 10 mg of Cardizem per hour.  NECK:  Shows no JVD.  Carotid upstrokes are equal bilaterally without bruits. There is no thyromegaly.  Trachea is midline.  HEART:  Reveals a soft S1, S2.  LUNGS:  Clear to auscultation.  ABDOMEN:  Protuberant with good bowel sounds.  There is no organomegaly.  EXTREMITIES:  No cyanosis, clubbing, or edema.  Pulses are intact.  NEUROLOGIC:  Grossly intact.  ASSESSMENT: 1. Paroxysmal atrial fibrillation.    a. Alcohol excess.    b. Hypokalemia.    c. Hypertension. 2. Hypertension. 3. Hypokalemia. 4. Alcohol use. 5. Tobacco use. 6. Obesity. 7. Obsessive-compulsive disorder. 8. History of peptic ulcer disease.  PLAN: 1. Admit to telemetry. 2. Check troponin in morning. 3. Continue Cardizem but increase to 20 mg/hour. 4. Continue other medications. 5. Patient told that much of this may be related to his lifestyle.  He has    been encouraged to decrease his tobacco intake as well as  alcohol binges. 6. Check TSH. 7. Replete potassium. 8. Check BMP in the morning.  If he converts to sinus rhythm, he may be discharged home tomorrow.  He needs to follow up with Dr. Jonny Ruiz.  An outpatient echo needs to be scheduled.  He will follow up with cardiology if he has recurrent atrial fibrillation. DD:  10/26/00 TD:  10/26/00 Job: [id number]

## 2011-03-13 ENCOUNTER — Telehealth: Payer: Self-pay

## 2011-03-13 MED ORDER — ZOLPIDEM TARTRATE 10 MG PO TABS: 10.0000 mg | ORAL_TABLET | Freq: Every evening | ORAL | Status: DC | PRN

## 2011-03-13 NOTE — Telephone Encounter (Signed)
Is the ambien 10 covered?

## 2011-03-13 NOTE — Telephone Encounter (Signed)
Pharmacy to inform that Zolpidem tart ER 12.5 not covered by insurance, please adivse on change.

## 2011-03-13 NOTE — Telephone Encounter (Signed)
Rx faxed to pharmacy  

## 2011-03-13 NOTE — Telephone Encounter (Signed)
Done hardcopy to dahlia/LIM B  

## 2011-03-13 NOTE — Telephone Encounter (Signed)
Called pharmacy and Ambien 10 mg is covered by insurance.

## 2011-03-18 ENCOUNTER — Other Ambulatory Visit: Payer: Self-pay | Admitting: Internal Medicine

## 2011-03-19 NOTE — Telephone Encounter (Signed)
Faxed hardcopy to pharmacy. 

## 2011-03-20 ENCOUNTER — Other Ambulatory Visit: Payer: Self-pay

## 2011-03-20 MED ORDER — ALPRAZOLAM ER 1 MG PO TB24: 1.0000 mg | ORAL_TABLET | ORAL | Status: DC

## 2011-03-20 NOTE — Telephone Encounter (Signed)
Faxed hardcopy to pharmacy. 

## 2011-03-21 ENCOUNTER — Other Ambulatory Visit: Payer: Self-pay | Admitting: Internal Medicine

## 2011-03-21 DIAGNOSIS — Z0000 Encounter for general adult medical examination without abnormal findings: Secondary | ICD-10-CM

## 2011-03-21 DIAGNOSIS — IMO0001 Reserved for inherently not codable concepts without codable children: Secondary | ICD-10-CM

## 2011-03-22 ENCOUNTER — Other Ambulatory Visit (INDEPENDENT_AMBULATORY_CARE_PROVIDER_SITE_OTHER): Payer: Medicare HMO

## 2011-03-22 DIAGNOSIS — IMO0001 Reserved for inherently not codable concepts without codable children: Secondary | ICD-10-CM

## 2011-03-22 DIAGNOSIS — Z0000 Encounter for general adult medical examination without abnormal findings: Secondary | ICD-10-CM

## 2011-03-22 LAB — URINALYSIS
Hgb urine dipstick: NEGATIVE
Nitrite: NEGATIVE
Specific Gravity, Urine: 1.02 (ref 1.000–1.030)
Total Protein, Urine: NEGATIVE
pH: 6.5 (ref 5.0–8.0)

## 2011-03-22 LAB — BASIC METABOLIC PANEL
BUN: 16 mg/dL (ref 6–23)
Calcium: 8.2 mg/dL — ABNORMAL LOW (ref 8.4–10.5)
GFR: 130.29 mL/min (ref >60.00)
Glucose, Bld: 119 mg/dL — ABNORMAL HIGH (ref 70–99)

## 2011-03-22 LAB — MICROALBUMIN / CREATININE URINE RATIO
Creatinine,U: 96 mg/dL
Microalb, Ur: 0.4 mg/dL (ref 0.0–1.9)

## 2011-03-22 LAB — HEPATIC FUNCTION PANEL
AST: 49 U/L — ABNORMAL HIGH (ref 0–37)
Alkaline Phosphatase: 74 U/L (ref 39–117)
Bilirubin, Direct: 0.2 mg/dL (ref 0.0–0.3)
Total Bilirubin: 0.5 mg/dL (ref 0.3–1.2)

## 2011-03-22 LAB — CBC WITH DIFFERENTIAL/PLATELET
Basophils Absolute: 0 10*3/uL (ref 0.0–0.1)
HCT: 43 % (ref 39.0–52.0)
Hemoglobin: 14.7 g/dL (ref 13.0–17.0)
Lymphs Abs: 1.6 10*3/uL (ref 0.7–4.0)
MCV: 93.8 fl (ref 78.0–100.0)
Monocytes Relative: 9.3 % (ref 3.0–12.0)
Neutro Abs: 3 10*3/uL (ref 1.4–7.7)
RDW: 14.1 % (ref 11.5–14.6)

## 2011-03-22 LAB — TSH: TSH: 1.65 u[IU]/mL (ref 0.35–5.50)

## 2011-03-22 LAB — HEMOGLOBIN A1C: Hgb A1c MFr Bld: 6.2 % (ref 4.6–6.5)

## 2011-03-22 LAB — LIPID PANEL: Cholesterol: 175 mg/dL (ref 0–200)

## 2011-03-29 DIAGNOSIS — G894 Chronic pain syndrome: Secondary | ICD-10-CM

## 2011-03-29 DIAGNOSIS — N400 Enlarged prostate without lower urinary tract symptoms: Secondary | ICD-10-CM

## 2011-04-03 ENCOUNTER — Encounter: Payer: Self-pay | Admitting: Internal Medicine

## 2011-04-03 ENCOUNTER — Ambulatory Visit (INDEPENDENT_AMBULATORY_CARE_PROVIDER_SITE_OTHER): Payer: Medicare HMO | Admitting: Internal Medicine

## 2011-04-03 ENCOUNTER — Telehealth: Payer: Self-pay

## 2011-04-03 ENCOUNTER — Ambulatory Visit (INDEPENDENT_AMBULATORY_CARE_PROVIDER_SITE_OTHER)
Admission: RE | Admit: 2011-04-03 | Discharge: 2011-04-03 | Disposition: A | Discharge status: Home / Self Care | Payer: Medicare HMO | Source: Ambulatory Visit | Attending: Internal Medicine | Admitting: Internal Medicine

## 2011-04-03 VITALS — BP 122/76 | HR 73 | Temp 97.7°F | Ht 70.0 in | Wt 324.4 lb

## 2011-04-03 DIAGNOSIS — J45901 Unspecified asthma with (acute) exacerbation: Secondary | ICD-10-CM

## 2011-04-03 DIAGNOSIS — R5382 Chronic fatigue, unspecified: Secondary | ICD-10-CM | POA: Insufficient documentation

## 2011-04-03 DIAGNOSIS — R079 Chest pain, unspecified: Secondary | ICD-10-CM | POA: Insufficient documentation

## 2011-04-03 DIAGNOSIS — Z0000 Encounter for general adult medical examination without abnormal findings: Secondary | ICD-10-CM

## 2011-04-03 DIAGNOSIS — R5383 Other fatigue: Secondary | ICD-10-CM

## 2011-04-03 DIAGNOSIS — R5381 Other malaise: Secondary | ICD-10-CM

## 2011-04-03 DIAGNOSIS — G4733 Obstructive sleep apnea (adult) (pediatric): Secondary | ICD-10-CM

## 2011-04-03 DIAGNOSIS — R609 Edema, unspecified: Secondary | ICD-10-CM

## 2011-04-03 MED ORDER — MODAFINIL 200 MG PO TABS: 200.0000 mg | ORAL_TABLET | Freq: Every day | ORAL | Status: DC

## 2011-04-03 MED ORDER — METHYLPREDNISOLONE ACETATE 80 MG/ML IJ SUSP
120.0000 mg | Freq: Once | INTRAMUSCULAR | Status: AC
  Administered 2011-04-03: 120 mg via INTRAMUSCULAR

## 2011-04-03 MED ORDER — FLUTICASONE-SALMETEROL 250-50 MCG/DOSE IN AEPB: 1.0000 | INHALATION_SPRAY | Freq: Two times a day (BID) | RESPIRATORY_TRACT | Status: DC

## 2011-04-03 MED ORDER — TIOTROPIUM BROMIDE MONOHYDRATE 18 MCG IN CAPS: 18.0000 ug | ORAL_CAPSULE | Freq: Every day | RESPIRATORY_TRACT | Status: DC

## 2011-04-03 MED ORDER — PREDNISONE 10 MG PO TABS: 10.0000 mg | ORAL_TABLET | Freq: Every day | ORAL | Status: DC

## 2011-04-03 NOTE — Telephone Encounter (Signed)
Started prior authorization for Modafinil 200 mg. Reference [id number], phone number [phone].Received  Prior authorization request form will complete and fax back for response.

## 2011-04-03 NOTE — Assessment & Plan Note (Addendum)
Now has insurance and pt agrees will need to f/u pulm for sleep apnea

## 2011-04-03 NOTE — Assessment & Plan Note (Addendum)
Slight worse, but will cont same hctz for now, for echo as well

## 2011-04-03 NOTE — Patient Instructions (Addendum)
You will be contacted regarding the referral for: pulmonary for the sleep apnea, echocardiogram and stress test for the heart You had the steroid shot today Take all new medications as prescribed - the prednisone for wheezing, and the provigil generic, and the prednisone You are given the samples for advair and spiriva Continue all other medications as before, except stop the Nuvigil Please go to XRAY in the Basement for the x-ray test Your blood tests were good/stable today Please return in 6 months, or sooner if needed

## 2011-04-03 NOTE — Assessment & Plan Note (Signed)
nuvigil too expensive, to try provigil instead

## 2011-04-03 NOTE — Assessment & Plan Note (Signed)
Atypical , one month, I suspect related to running out of pulm meds, but will need cxr, stress test, re-start med

## 2011-04-05 ENCOUNTER — Other Ambulatory Visit: Payer: Self-pay | Admitting: *Deleted

## 2011-04-05 MED ORDER — ALBUTEROL SULFATE HFA 108 (90 BASE) MCG/ACT IN AERS: 2.0000 | INHALATION_SPRAY | Freq: Four times a day (QID) | RESPIRATORY_TRACT | Status: DC

## 2011-04-07 ENCOUNTER — Encounter: Payer: Self-pay | Admitting: Internal Medicine

## 2011-04-07 NOTE — Assessment & Plan Note (Signed)
Mild to mod, for med refills today

## 2011-04-07 NOTE — Assessment & Plan Note (Signed)
Overall doing well, age appropriate education and counseling updated, referrals for preventative services and immunizations addressed, dietary and smoking counseling addressed, most recent labs and ECG reviewed.  I have personally reviewed and have noted: 1) the patient's medical and social history 2) The pt's use of alcohol, tobacco, and illicit drugs 3) The patient's current medications and supplements 4) Functional ability including ADL's, fall risk, home safety risk, hearing and visual impairment 5) Diet and physical activities 6) Evidence for depression or mood disorder 7) The patient's height, weight, and BMI have been recorded in the chart I have made referrals, and provided counseling and education based on review of the above  

## 2011-04-07 NOTE — Progress Notes (Signed)
Subjective:    Patient ID: [name], male    DOB: 02-22-1956, 55 y.o.   MRN: [id number]  HPI Here for wellness and f/u;  Overall doing ok;  Pt denies worsening SOB, DOE, orthopnea, PND, worsening LE edema, palpitations, dizziness or syncope, though has had increase in asthma wheezing in the past 2 wks - out of adviar and spiriva, and intermittent dull upper anterior chest discomfort nonpleuritic but ? Exertional, without radiation, n/v, diaphoresis, ongoing for over 1 mo.  Pt denies neurological change such as new Headache, facial or extremity weakness.  Pt denies polydipsia, polyuria, or low sugar symptoms. Pt states overall good compliance with treatment and medications, good tolerability, and trying to follow lower cholesterol diet.  Pt denies worsening depressive symptoms, suicidal ideation or panic. No fever, wt loss, night sweats, loss of appetite, or other constitutional symptoms.  Pt states good ability with ADL's, low fall risk, home safety reviewed and adequate, no significant changes in hearing or vision, and occasionally active with exercise. Ronne Binning too expensive., due for OSA f/u. Past Medical History  Diagnosis Date  . ALLERGIC RHINITIS 06/09/2007  . ANXIETY 06/09/2007  . ASTHMA 10/21/2007  . Chronic pain syndrome 05/18/2010  . BENIGN PROSTATIC HYPERTROPHY 06/09/2007  . DEPRESSION 06/03/2007  . DIABETES MELLITUS, TYPE II 06/09/2007  . DYSKINESIA, ESOPHAGUS 06/03/2007  . FIBROMYALGIA 03/11/2008  . FREQUENCY, URINARY 06/28/2008  . GERD 06/09/2007  . HYPERLIPIDEMIA 06/03/2007  . HYPERTENSION 06/03/2007  . INSOMNIA 06/03/2007  . LOW BACK PAIN 06/09/2007  . Morbid obesity 06/03/2007  . NEPHROLITHIASIS, HX OF 10/21/2007  . OBSESSIVE-COMPULSIVE DISORDER 06/03/2007  . ORCHITIS 11/18/2008  . PERIPHERAL EDEMA 10/21/2007  . PLANTAR FASCIITIS, BILATERAL 06/03/2007  . POLYARTHRALGIA 10/21/2007  . Rash and other nonspecific skin eruption 04/12/2009  . SHOULDER PAIN, LEFT 05/18/2010  . SINUSITIS- ACUTE-NOS  12/26/2010  . SLEEP APNEA, OBSTRUCTIVE 10/21/2007  . UNSPECIFIED HYPOTENSION 12/26/2010  . UNSPECIFIED URETHRAL STRICTURE 12/26/2010   Past Surgical History  Procedure Date  . Tonsillectomy     reports that he has quit smoking. He does not have any smokeless tobacco history on file. He reports that he drinks alcohol. He reports that he does not use illicit drugs. family history includes Asthma in his mother; COPD in his mother; and Colon polyps in his mother. Allergies  Allergen Reactions  . Simvastatin     REACTION: myalgia   Current Outpatient Prescriptions on File Prior to Visit  Medication Sig Dispense Refill  . ALPRAZolam (XANAX XR) 1 MG 24 hr tablet Take 1 tablet (1 mg total) by mouth every morning.  30 tablet  5  . aspirin 81 MG EC tablet Take 81 mg by mouth daily.        [name] atorvastatin (LIPITOR) 20 MG tablet Take 20 mg by mouth daily.        . carisoprodol (SOMA) 350 MG tablet Take 1 tablet (350 mg total) by mouth 3 (three) times daily as needed for muscle spasms.  90 tablet  3  . ibuprofen (ADVIL,MOTRIN) 800 MG tablet Take 800 mg by mouth 2 (two) times daily.        [name] lisinopril-hydrochlorothiazide (PRINZIDE,ZESTORETIC) 20-25 MG per tablet Take 1 tablet by mouth daily.        . sertraline (ZOLOFT) 100 MG tablet Take 100 mg by mouth 2 (two) times daily.        . traZODone (DESYREL) 100 MG tablet TAKE 1/2 TABLET BY MOUTH AT BEDTIME AS NEEDED  45  tablet  1  . triamcinolone (KENALOG) 0.1 % cream Apply topically 2 (two) times daily.        [name] zolpidem (AMBIEN) 10 MG tablet Take 1 tablet (10 mg total) by mouth at bedtime as needed for sleep.  30 tablet  5   Review of Systems Review of Systems  Constitutional: Negative for diaphoresis, activity change, appetite change and unexpected weight change.  HENT: Negative for hearing loss, ear pain, facial swelling, mouth sores and neck stiffness.   Eyes: Negative for pain, redness and visual disturbance.  Respiratory: Negative for shortness of  breath and wheezing.   Cardiovascular: Negative for chest pain and palpitations.  Gastrointestinal: Negative for diarrhea, blood in stool, abdominal distention and rectal pain.  Genitourinary: Negative for hematuria, flank pain and decreased urine volume.  Musculoskeletal: Negative for myalgias and joint swelling.  Skin: Negative for color change and wound.  Neurological: Negative for syncope and numbness.  Hematological: Negative for adenopathy.  Psychiatric/Behavioral: Negative for hallucinations, self-injury, decreased concentration and agitation.      Objective:   Physical Exam BP 122/76  Pulse 73  Temp(Src) 97.7 F (36.5 C) (Oral)  Ht 5\' 10"  (1.778 m)  Wt 324 lb 6 oz (147.136 kg)  BMI 46.54 kg/m2  SpO2 92% Physical Exam  VS noted Constitutional: Pt is oriented to person, place, and time. Appears well-developed and well-nourished.  HENT:  Head: Normocephalic and atraumatic.  Right Ear: External ear normal.  Left Ear: External ear normal.  Nose: Nose normal.  Mouth/Throat: Oropharynx is clear and moist.  Eyes: Conjunctivae and EOM are normal. Pupils are equal, round, and reactive to light.  Neck: Normal range of motion. Neck supple. No JVD present. No tracheal deviation present.  Cardiovascular: Normal rate, regular rhythm, normal heart sounds and intact distal pulses.   Pulmonary/Chest: Effort normal and breath sounds mild decreased with wheeze bilat Abdominal: Soft. Bowel sounds are normal. There is no tenderness.  Musculoskeletal: Normal range of motion. Exhibits no edema.  Lymphadenopathy:  Has no cervical adenopathy.  Neurological: Pt is alert and oriented to person, place, and time. Pt has normal reflexes. No cranial nerve deficit.  Skin: Skin is warm and dry. No rash noted.  Psychiatric:  Has depressed mood and affect. Behavior is normal.         Assessment & Plan:

## 2011-04-16 ENCOUNTER — Ambulatory Visit (HOSPITAL_COMMUNITY): Payer: Medicare HMO | Attending: Internal Medicine | Admitting: Radiology

## 2011-04-16 ENCOUNTER — Ambulatory Visit (HOSPITAL_BASED_OUTPATIENT_CLINIC_OR_DEPARTMENT_OTHER): Payer: Medicare HMO | Admitting: Radiology

## 2011-04-16 ENCOUNTER — Encounter (HOSPITAL_COMMUNITY): Payer: Medicare HMO | Admitting: Radiology

## 2011-04-16 DIAGNOSIS — M7989 Other specified soft tissue disorders: Secondary | ICD-10-CM | POA: Insufficient documentation

## 2011-04-16 DIAGNOSIS — E669 Obesity, unspecified: Secondary | ICD-10-CM | POA: Insufficient documentation

## 2011-04-16 DIAGNOSIS — I079 Rheumatic tricuspid valve disease, unspecified: Secondary | ICD-10-CM | POA: Insufficient documentation

## 2011-04-16 DIAGNOSIS — I10 Essential (primary) hypertension: Secondary | ICD-10-CM | POA: Insufficient documentation

## 2011-04-16 DIAGNOSIS — R0602 Shortness of breath: Secondary | ICD-10-CM

## 2011-04-16 DIAGNOSIS — R0609 Other forms of dyspnea: Secondary | ICD-10-CM | POA: Insufficient documentation

## 2011-04-16 DIAGNOSIS — R079 Chest pain, unspecified: Secondary | ICD-10-CM

## 2011-04-16 DIAGNOSIS — R0989 Other specified symptoms and signs involving the circulatory and respiratory systems: Secondary | ICD-10-CM | POA: Insufficient documentation

## 2011-04-16 DIAGNOSIS — E785 Hyperlipidemia, unspecified: Secondary | ICD-10-CM | POA: Insufficient documentation

## 2011-04-16 DIAGNOSIS — R609 Edema, unspecified: Secondary | ICD-10-CM

## 2011-04-16 MED ORDER — REGADENOSON 0.4 MG/5ML IV SOLN
0.4000 mg | Freq: Once | INTRAVENOUS | Status: AC
  Administered 2011-04-16: 0.4 mg via INTRAVENOUS

## 2011-04-16 MED ORDER — TECHNETIUM TC 99M TETROFOSMIN IV KIT
33.0000 | PACK | Freq: Once | INTRAVENOUS | Status: AC | PRN
  Administered 2011-04-16: 33 via INTRAVENOUS

## 2011-04-16 NOTE — Progress Notes (Signed)
[facility] SITE 3 NUCLEAR MED [address] [place] [address] [phone]  Cardiology Nuclear Med Study  [name] is a 55 y.o. male [id number] 06/04/1956   Nuclear Med Background Indication for Stress Test:  Evaluation for Ischemia History:  Asthma Cardiac Risk Factors: History of Smoking, Hypertension, Lipids and NIDDM  Symptoms:  Chest Pain, DOE, Fatigue, Palpitations and SOB   Nuclear Pre-Procedure Caffeine/Decaff Intake:  7:00pm NPO After: 9:00pm   Lungs:  clear IV 0.9% NS with Angio Cath:  20g  IV Site: R Hand  IV Started by:  Cathlyn Parsons, RN  Chest Size (in):  58 Cup Size: n/a  Height: 5\' 10"  (1.778 m)  Weight:  323 lb (146.512 kg)  BMI:  Body mass index is 46.35 kg/(m^2). Tech Comments:  Patient did inhalers this am and lungs are clear with O2sat 94%    Nuclear Med Study 1 or 2 day study: 2 day  Stress Test Type:  Treadmill/Lexiscan  Reading MD: Charlton Haws.MD Order Authorizing Provider:  J.John  Resting Radionuclide: Technetium 59m Tetrofosmin  Resting Radionuclide Dose: 33.0 mCi   Stress Radionuclide:  Technetium 60m Tetrofosmin  Stress Radionuclide Dose: 33.0 mCi           Stress Protocol Rest HR: 87 Stress HR: 109  Rest BP: 108/81 Stress BP: 136/60  Exercise Time (min): n/a METS: n/a   Predicted Max HR: 164 bpm % Max HR: 66.46 bpm Rate Pressure Product: 21308   Dose of Adenosine (mg):  n/a Dose of Lexiscan: 0.4 mg  Dose of Atropine (mg): n/a Dose of Dobutamine: n/a mcg/kg/min (at max HR)  Stress Test Technologist: Milana Na, EMT-P  Nuclear Technologist:  Domenic Polite, CNMT     Rest Procedure:  Myocardial perfusion imaging was performed at rest 45 minutes following the intravenous administration of Technetium 60m Tetrofosmin. Rest ECG: NSR  Stress Procedure:  The patient received IV Lexiscan 0.4 mg over 15-seconds with concurrent low level exercise and then Technetium 19m Tetrofosmin was injected at  30-seconds while the patient continued walking one more minute.  There were no significant changes with Lexiscan.  Quantitative spect images were obtained after a 45-minute delay. Stress ECG: No significant change from baseline ECG  QPS Raw Data Images:  Normal; no motion artifact; normal heart/lung ratio. Stress Images:  There is decreased uptake in the inferior wall. Rest Images:  There is decreased uptake in the inferior wall. Subtraction (SDS):  No reversibility is appreciated. Transient Ischemic Dilatation (Normal <1.22):  No Lung/Heart Ratio (Normal <0.45):  Normal  Quantitative Gated Spect Images QGS EDV:  175 ml QGS ESV:  98 ml QGS cine images:  Diffuse hypokinesis worse in the apex QGS EF: EF 44%  Impression Exercise Capacity:  Lexiscan with no exercise. BP Response:  Normal blood pressure response. Clinical Symptoms:  No chest pain. ECG Impression:  No significant ST segment change suggestive of ischemia. Comparison with Prior Nuclear Study: No images to compare  Overall Impression:  Small inferior wall infarct at mid and apical level with no ischemia.  EF depressed.  RV very prominant    Charlton Haws

## 2011-04-17 ENCOUNTER — Ambulatory Visit (HOSPITAL_COMMUNITY): Payer: Medicare HMO | Attending: Internal Medicine | Admitting: Radiology

## 2011-04-17 DIAGNOSIS — R0989 Other specified symptoms and signs involving the circulatory and respiratory systems: Secondary | ICD-10-CM

## 2011-04-17 MED ORDER — TECHNETIUM TC 99M TETROFOSMIN IV KIT
33.0000 | PACK | Freq: Once | INTRAVENOUS | Status: AC | PRN
  Administered 2011-04-17: 33 via INTRAVENOUS

## 2011-04-19 ENCOUNTER — Encounter: Payer: Self-pay | Admitting: Internal Medicine

## 2011-04-19 ENCOUNTER — Telehealth: Payer: Self-pay

## 2011-04-19 DIAGNOSIS — I252 Old myocardial infarction: Secondary | ICD-10-CM

## 2011-04-19 NOTE — Telephone Encounter (Signed)
Called the patient and he does want a referral to a Cardiologist

## 2011-04-19 NOTE — Telephone Encounter (Signed)
Referral done to cardiology.  

## 2011-04-19 NOTE — Telephone Encounter (Signed)
Message copied by Pincus Sanes on Thu Apr 19, 2011  3:34 PM ------      Message from: [name]      Created: Thu Apr 19, 2011  1:17 PM      Regarding: abnormal stress test       Please call pt;  Stress test is Negative for ischemia, BUT does show evidence of prior small MI;  If he wants I can refer to Cardiology (not urgent, but I think should be seen at least to establish in case more problem in the future)

## 2011-04-19 NOTE — Progress Notes (Signed)
Routed to Dr. Jonny Ruiz

## 2011-04-23 ENCOUNTER — Other Ambulatory Visit: Payer: Self-pay | Admitting: Internal Medicine

## 2011-04-23 ENCOUNTER — Ambulatory Visit (INDEPENDENT_AMBULATORY_CARE_PROVIDER_SITE_OTHER): Payer: Medicare HMO | Admitting: Pulmonary Disease

## 2011-04-23 ENCOUNTER — Encounter: Payer: Self-pay | Admitting: Pulmonary Disease

## 2011-04-23 VITALS — BP 112/74 | HR 83 | Temp 97.6°F | Ht 70.0 in | Wt 333.8 lb

## 2011-04-23 DIAGNOSIS — G4733 Obstructive sleep apnea (adult) (pediatric): Secondary | ICD-10-CM

## 2011-04-23 NOTE — Progress Notes (Signed)
nuc med study routed to Dr. Jonny Ruiz. 04/23/11 [name]

## 2011-04-23 NOTE — Progress Notes (Signed)
  Subjective:    Patient ID: [name], male    DOB: May 01, 1956, 55 y.o.   MRN: [id number]  HPI The pt is a 55y/o male who I have been asked to see for management of osa.  He was diagnosed with OSA in 2008 with AHI 36/hr, and was started on cpap.  He currently is not using a cpap machine, and no longer has one.  His current hx is significant for: -loud snoring, pauses in his breathing during sleep, and nonrestorative sleep upon awakening.   -falls asleep easily during day with any period of inactivity, and fall asleep in early evenings as well.   -he does very little driving -weight up about 18 pounds from prior sleep study, and epworth score today is 13.   Sleep Questionnaire: What time do you typically go to bed?( Between what hours) 12am to 1am How long does it take you to fall asleep? long time How many times during the night do you wake up? 4 What time do you get out of bed to start your day? 0900 Do you drive or operate heavy machinery in your occupation? No How much has your weight changed (up or down) over the past two years? (In pounds) 20 lb (9.072 kg) Have you ever had a sleep study before? Yes If yes, location of study? [facility] If yes, date of study? 2008 Do you currently use CPAP? No Do you wear oxygen at any time? No    Review of Systems  Constitutional: Positive for unexpected weight change. Negative for fever.  HENT: Positive for congestion. Negative for ear pain, nosebleeds, sore throat, rhinorrhea, sneezing, trouble swallowing, dental problem, postnasal drip and sinus pressure.   Eyes: Negative for redness and itching.  Respiratory: Positive for cough and shortness of breath. Negative for chest tightness and wheezing.   Cardiovascular: Positive for chest pain and leg swelling. Negative for palpitations.  Gastrointestinal: Positive for nausea. Negative for vomiting.  Genitourinary: Negative for dysuria.  Musculoskeletal: Positive for joint swelling.  Skin: Negative for rash.    Neurological: Positive for headaches.  Hematological: Does not bruise/bleed easily.  Psychiatric/Behavioral: Positive for dysphoric mood. The patient is nervous/anxious.        Objective:   Physical Exam Constitutional:  Morbidly obese male, no acute distress  HENT:  Nares extremely narrrowed bilat, no purulence noted.  Oropharynx without exudate, palate and uvula are elongated.  Eyes:  Perrla, eomi, no scleral icterus  Neck:  No JVD, no TMG  Cardiovascular:  Normal rate, regular rhythm, no rubs or gallops.  No murmurs        Intact distal pulses  Pulmonary :  Normal breath sounds, no stridor or respiratory distress   No rales, rhonchi, or wheezing  Abdominal:  Soft, nondistended, bowel sounds present.  No tenderness noted.   Musculoskeletal:  mild lower extremity edema noted.  Lymph Nodes:  No cervical lymphadenopathy noted  Skin:  No cyanosis noted  Neurologic:  Alert, appropriate, moves all 4 extremities without obvious deficit.         Assessment & Plan:

## 2011-04-23 NOTE — Patient Instructions (Signed)
Will start on cpap, set on automatic mode, with full face mask Please call if issues with tolerance Work on weight loss followup with me in 5 weeks.

## 2011-04-23 NOTE — Assessment & Plan Note (Signed)
The pt has known severe osa from 2008, and quit using cpap after 6mos for various reasons.  He then lost his insurance, and had to turn his machine back in.  He has gained weight since his last visit here, and is very symptomatic with nonrestorative sleep and EDS.  He is willing to try cpap again, and I have also encouraged him to work aggressively on weight loss.

## 2011-04-25 ENCOUNTER — Other Ambulatory Visit: Payer: Self-pay | Admitting: Internal Medicine

## 2011-04-27 ENCOUNTER — Other Ambulatory Visit: Payer: Self-pay | Admitting: Internal Medicine

## 2011-04-27 NOTE — Telephone Encounter (Signed)
Zoloft-last refill: #[id number] 03/29/2010

## 2011-05-04 MED ORDER — SERTRALINE HCL 100 MG PO TABS: 100.0000 mg | ORAL_TABLET | Freq: Every day | ORAL | Status: DC

## 2011-05-10 ENCOUNTER — Ambulatory Visit: Payer: Medicare HMO | Admitting: Cardiovascular Disease

## 2011-05-26 ENCOUNTER — Other Ambulatory Visit: Payer: Self-pay | Admitting: Internal Medicine

## 2011-05-28 ENCOUNTER — Ambulatory Visit: Payer: Medicare HMO | Admitting: Pulmonary Disease

## 2011-06-15 ENCOUNTER — Other Ambulatory Visit: Payer: Self-pay

## 2011-06-15 MED ORDER — CARISOPRODOL 350 MG PO TABS: 350.0000 mg | ORAL_TABLET | Freq: Three times a day (TID) | ORAL | Status: DC | PRN

## 2011-06-15 NOTE — Telephone Encounter (Signed)
Faxed hardcopy to [facility]. [phone]

## 2011-07-04 ENCOUNTER — Ambulatory Visit: Payer: Medicare HMO | Admitting: Pulmonary Disease

## 2011-07-09 ENCOUNTER — Ambulatory Visit (INDEPENDENT_AMBULATORY_CARE_PROVIDER_SITE_OTHER): Payer: Medicare HMO

## 2011-07-09 DIAGNOSIS — Z23 Encounter for immunization: Secondary | ICD-10-CM

## 2011-07-21 ENCOUNTER — Other Ambulatory Visit: Payer: Self-pay | Admitting: Internal Medicine

## 2011-08-27 ENCOUNTER — Other Ambulatory Visit: Payer: Self-pay

## 2011-08-27 MED ORDER — ZOLPIDEM TARTRATE 10 MG PO TABS: 10.0000 mg | ORAL_TABLET | Freq: Every evening | ORAL | Status: DC | PRN

## 2011-08-28 NOTE — Telephone Encounter (Signed)
Faxed hardcopy to pharmacy. 

## 2011-09-10 ENCOUNTER — Telehealth: Payer: Self-pay

## 2011-09-10 NOTE — Telephone Encounter (Signed)
Ok to change to 2 tab per day - to robin to handle

## 2011-09-10 NOTE — Telephone Encounter (Signed)
Pt called stating he was previously Rx'd Zoloft 100 mg tabs 2 po qd. Rx written in July is for 1 tab qd. Pt has been running out early and is requesting to have Rx changed back to original dose, okay to change?

## 2011-09-11 MED ORDER — SERTRALINE HCL 100 MG PO TABS: 100.0000 mg | ORAL_TABLET | Freq: Two times a day (BID) | ORAL | Status: DC

## 2011-09-11 NOTE — Telephone Encounter (Signed)
Called the patient informed medication request has been taken care of and sent to his pharmacy.

## 2011-09-12 ENCOUNTER — Other Ambulatory Visit: Payer: Self-pay | Admitting: Internal Medicine

## 2011-09-12 MED ORDER — ALPRAZOLAM ER 1 MG PO TB24: 1.0000 mg | ORAL_TABLET | ORAL | Status: DC

## 2011-09-26 ENCOUNTER — Encounter: Payer: Self-pay | Admitting: Internal Medicine

## 2011-09-26 ENCOUNTER — Ambulatory Visit (INDEPENDENT_AMBULATORY_CARE_PROVIDER_SITE_OTHER): Payer: Medicare HMO | Admitting: Internal Medicine

## 2011-09-26 VITALS — BP 122/70 | HR 84 | Temp 97.5°F | Ht 71.0 in | Wt 342.4 lb

## 2011-09-26 DIAGNOSIS — L2089 Other atopic dermatitis: Secondary | ICD-10-CM

## 2011-09-26 DIAGNOSIS — K130 Diseases of lips: Secondary | ICD-10-CM

## 2011-09-26 DIAGNOSIS — IMO0001 Reserved for inherently not codable concepts without codable children: Secondary | ICD-10-CM

## 2011-09-26 DIAGNOSIS — I8390 Asymptomatic varicose veins of unspecified lower extremity: Secondary | ICD-10-CM

## 2011-09-26 DIAGNOSIS — Z0000 Encounter for general adult medical examination without abnormal findings: Secondary | ICD-10-CM

## 2011-09-26 DIAGNOSIS — L308 Other specified dermatitis: Secondary | ICD-10-CM | POA: Insufficient documentation

## 2011-09-26 DIAGNOSIS — L209 Atopic dermatitis, unspecified: Secondary | ICD-10-CM

## 2011-09-26 DIAGNOSIS — E119 Type 2 diabetes mellitus without complications: Secondary | ICD-10-CM

## 2011-09-26 MED ORDER — PREGABALIN 50 MG PO CAPS: 50.0000 mg | ORAL_CAPSULE | Freq: Three times a day (TID) | ORAL | Status: DC

## 2011-09-26 MED ORDER — METHYLPREDNISOLONE ACETATE PF 80 MG/ML IJ SUSP
120.0000 mg | Freq: Once | INTRAMUSCULAR | Status: AC
  Administered 2011-09-26: 120 mg via INTRAMUSCULAR

## 2011-09-26 MED ORDER — PREDNISONE 10 MG PO TABS: 10.0000 mg | ORAL_TABLET | Freq: Every day | ORAL | Status: DC

## 2011-09-26 NOTE — Patient Instructions (Addendum)
You had the steroid shot today Take all new medications as prescribed - the prednisone, and the lyrica at 50 mg three times per day for pain Continue all other medications as before You will be contacted regarding the referral for: rheumatology, vein clinic, and bariatric clinic, and oral surgeon Please return in 1 mo with Lab testing done 3-5 days before

## 2011-09-30 ENCOUNTER — Encounter: Payer: Self-pay | Admitting: Internal Medicine

## 2011-09-30 NOTE — Assessment & Plan Note (Signed)
stable overall by hx and exam, most recent data reviewed with pt, and pt to continue medical treatment as before  Lab Results  Component Value Date   HGBA1C 6.2 03/22/2011  pt to call for onset polys with current steroid tx or cbg > 200

## 2011-09-30 NOTE — Assessment & Plan Note (Signed)
Moderate symptoms, for depomedrol IM, and predpack asd,  to f/u any worsening symptoms or concerns

## 2011-09-30 NOTE — Progress Notes (Signed)
Subjective:    Patient ID: [name], male    DOB: September 28, 1956, 55 y.o.   MRN: [id number]  HPI  Here to f/u with several complaints, frustrated over ongoing wt gain, unable to lose wt, asks for referral to bariatric clinic.  Also with ongoing FMS type pain, and asks for rheum referral.  Also with a lower mid lip lesion slight raised but not healing and nontender x 2 mo.  Also with marked flare of atopic dermatitis type rash and itching to arms, and asks for steroid tx.  Also with c/o ongoing RLE large varicose vein with discomfort located above and below the knee.  Pt denies chest pain, increased sob or doe, wheezing, orthopnea, PND, increased LE swelling, palpitations, dizziness or syncope.  Pt denies new neurological symptoms such as new headache, or facial or extremity weakness or numbness   Pt denies polydipsia, polyuria.   Pt denies fever, wt loss, night sweats, loss of appetite, or other constitutional symptoms  Cont's to f/u with psychiatry as well Past Medical History  Diagnosis Date  . ALLERGIC RHINITIS 06/09/2007  . ANXIETY 06/09/2007  . ASTHMA 10/21/2007  . Chronic pain syndrome 05/18/2010  . BENIGN PROSTATIC HYPERTROPHY 06/09/2007  . DEPRESSION 06/03/2007  . DIABETES MELLITUS, TYPE II 06/09/2007  . DYSKINESIA, ESOPHAGUS 06/03/2007  . FIBROMYALGIA 03/11/2008  . FREQUENCY, URINARY 06/28/2008  . GERD 06/09/2007  . HYPERLIPIDEMIA 06/03/2007  . HYPERTENSION 06/03/2007  . INSOMNIA 06/03/2007  . LOW BACK PAIN 06/09/2007  . Morbid obesity 06/03/2007  . NEPHROLITHIASIS, HX OF 10/21/2007  . OBSESSIVE-COMPULSIVE DISORDER 06/03/2007  . ORCHITIS 11/18/2008  . PERIPHERAL EDEMA 10/21/2007  . PLANTAR FASCIITIS, BILATERAL 06/03/2007  . POLYARTHRALGIA 10/21/2007  . Rash and other nonspecific skin eruption 04/12/2009  . SHOULDER PAIN, LEFT 05/18/2010  . SINUSITIS- ACUTE-NOS 12/26/2010  . SLEEP APNEA, OBSTRUCTIVE 10/21/2007  . UNSPECIFIED HYPOTENSION 12/26/2010  . UNSPECIFIED URETHRAL STRICTURE 12/26/2010  . Past  myocardial infarction 04/19/2011   Past Surgical History  Procedure Date  . Tonsillectomy     reports that he quit smoking about 3 years ago. His smoking use included Cigarettes. He has a 60 pack-year smoking history. He does not have any smokeless tobacco history on file. He reports that he drinks alcohol. He reports that he does not use illicit drugs. family history includes Allergies in his mother; Asthma in his maternal grandmother and mother; COPD in his mother; and Colon polyps in his mother. Allergies  Allergen Reactions  . Simvastatin     REACTION: myalgia   Current Outpatient Prescriptions on File Prior to Visit  Medication Sig Dispense Refill  . albuterol (PROVENTIL HFA;VENTOLIN HFA) 108 (90 BASE) MCG/ACT inhaler Inhale 2 puffs into the lungs every 4 (four) hours as needed.        . ALPRAZolam (ALPRAZOLAM XR) 1 MG 24 hr tablet Take 1 tablet (1 mg total) by mouth every morning.  30 tablet  5  . aspirin 81 MG EC tablet Take 81 mg by mouth daily.        . carisoprodol (SOMA) 350 MG tablet Take 1 tablet (350 mg total) by mouth 3 (three) times daily as needed for muscle spasms.  90 tablet  3  . doxepin (SINEQUAN) 25 MG capsule 1 by mouth every 8 hours       . Fluticasone-Salmeterol (ADVAIR) 250-50 MCG/DOSE AEPB Inhale 1 puff into the lungs daily.        [name] ibuprofen (ADVIL,MOTRIN) 800 MG tablet TAKE 1 TABLET BY MOUTH TWICE  A DAY  180 tablet  3  . minocycline (MINOCIN,DYNACIN) 100 MG capsule Take 100 mg by mouth 2 (two) times daily. With food       . omeprazole (PRILOSEC) 20 MG capsule TAKE 2 CAPSULES BY MOUTH EVERY DAY  180 capsule  3  . sertraline (ZOLOFT) 100 MG tablet Take 1 tablet (100 mg total) by mouth 2 (two) times daily.  60 tablet  11  . tiotropium (SPIRIVA HANDIHALER) 18 MCG inhalation capsule Place 1 capsule (18 mcg total) into inhaler and inhale daily.  30 capsule  2  . traZODone (DESYREL) 100 MG tablet TAKE 1/2 TABLET BY MOUTH AT BEDTIME AS NEEDED  45 tablet  1  .  triamcinolone (KENALOG) 0.1 % cream Apply topically 2 (two) times daily.         Review of Systems Review of Systems  Constitutional: Negative for diaphoresis and unexpected weight change.  HENT: Negative for drooling and tinnitus.   Eyes: Negative for photophobia and visual disturbance.  Respiratory: Negative for choking and stridor.   Gastrointestinal: Negative for vomiting and blood in stool.  Genitourinary: Negative for hematuria and decreased urine volume.    Objective:   Physical Exam BP 122/70  Pulse 84  Temp(Src) 97.5 F (36.4 C) (Oral)  Ht 5\' 11"  (1.803 m)  Wt 342 lb 6 oz (155.3 kg)  BMI 47.75 kg/m2  SpO2 93% Physical Exam  VS noted, morbid obese, not ill appaering Constitutional: Pt appears well-developed and well-nourished.  HENT: Head: Normocephalic.  Right Ear: External ear normal.  Left Ear: External ear normal. Lower lip mid aspect slight elevation nontender approx 8 mm, dark Eyes: Conjunctivae and EOM are normal. Pupils are equal, round, and reactive to light.  Neck: Normal range of motion. Neck supple.  Cardiovascular: Normal rate and regular rhythm.   Pulmonary/Chest: Effort normal and breath sounds normal.  Abd:  Soft, NT, non-distended, + BS Neurological: Pt is alert. No cranial nerve deficit.  Skin: Skin is warm. No erythema except for marked atopic derm rash to arms, and large RLE varicosity noted without phlebitis; Psychiatric: Pt behavior is normal. Thought content normal.         Assessment & Plan:

## 2011-09-30 NOTE — Assessment & Plan Note (Signed)
Ok for bariatric surgury referral 

## 2011-09-30 NOTE — Assessment & Plan Note (Addendum)
Ok to refer to rheum per pt request, also gave sample lyrica trial as well,  to f/u any worsening symptoms or concerns

## 2011-09-30 NOTE — Assessment & Plan Note (Signed)
Symptomatic, large - for vein clinic referral,  to f/u any worsening symptoms or concerns

## 2011-09-30 NOTE — Assessment & Plan Note (Signed)
Somewhat suspicious - for oral surgeon referral per pt request

## 2011-10-03 ENCOUNTER — Other Ambulatory Visit: Payer: Self-pay

## 2011-10-03 DIAGNOSIS — I83893 Varicose veins of bilateral lower extremities with other complications: Secondary | ICD-10-CM

## 2011-10-04 ENCOUNTER — Other Ambulatory Visit: Payer: Self-pay

## 2011-10-04 MED ORDER — MINOCYCLINE HCL 100 MG PO CAPS: 100.0000 mg | ORAL_CAPSULE | Freq: Two times a day (BID) | ORAL | Status: DC

## 2011-10-10 ENCOUNTER — Other Ambulatory Visit: Payer: Self-pay

## 2011-10-10 MED ORDER — CARISOPRODOL 350 MG PO TABS: 350.0000 mg | ORAL_TABLET | Freq: Three times a day (TID) | ORAL | Status: DC | PRN

## 2011-10-10 NOTE — Telephone Encounter (Signed)
Done hardcopy to robin  

## 2011-10-10 NOTE — Telephone Encounter (Signed)
Faxed hardcopy to pharmacy. 

## 2011-10-22 ENCOUNTER — Telehealth: Payer: Self-pay

## 2011-10-22 MED ORDER — GABAPENTIN 300 MG PO CAPS: 300.0000 mg | ORAL_CAPSULE | Freq: Three times a day (TID) | ORAL | Status: DC

## 2011-10-22 NOTE — Telephone Encounter (Signed)
Patient has been out of Lyrica for a couple days. The pharmacy stated it would need to be prior Authorized before filling again or try alternative (Gabapentin)? Please advise, call back number is [phone]

## 2011-10-22 NOTE — Telephone Encounter (Signed)
Patient is ok with changing to Neurontin.

## 2011-10-22 NOTE — Telephone Encounter (Signed)
Ok to stop the lyrica  OK to start the neurontin at 300 tid, BUT needs to start at one pill per night 3 nights, then bid for 3 days, then tid so avoid sleepiness with starting the medication -- Done escript

## 2011-10-22 NOTE — Telephone Encounter (Signed)
i think ok to change to neurontin 300 tid (generic) but would have to be Ok with pt first

## 2011-10-23 NOTE — Telephone Encounter (Signed)
Patient informed of instructions per MD on medication.

## 2011-10-26 ENCOUNTER — Other Ambulatory Visit (INDEPENDENT_AMBULATORY_CARE_PROVIDER_SITE_OTHER): Payer: Medicare HMO

## 2011-10-26 ENCOUNTER — Encounter: Payer: Self-pay | Admitting: Internal Medicine

## 2011-10-26 ENCOUNTER — Other Ambulatory Visit: Payer: Self-pay | Admitting: Internal Medicine

## 2011-10-26 ENCOUNTER — Ambulatory Visit (INDEPENDENT_AMBULATORY_CARE_PROVIDER_SITE_OTHER): Payer: Medicare HMO | Admitting: Internal Medicine

## 2011-10-26 VITALS — BP 130/78 | HR 80 | Temp 97.8°F | Ht 70.0 in | Wt 345.5 lb

## 2011-10-26 DIAGNOSIS — L2089 Other atopic dermatitis: Secondary | ICD-10-CM

## 2011-10-26 DIAGNOSIS — Z0000 Encounter for general adult medical examination without abnormal findings: Secondary | ICD-10-CM

## 2011-10-26 DIAGNOSIS — I10 Essential (primary) hypertension: Secondary | ICD-10-CM

## 2011-10-26 DIAGNOSIS — L209 Atopic dermatitis, unspecified: Secondary | ICD-10-CM

## 2011-10-26 DIAGNOSIS — E119 Type 2 diabetes mellitus without complications: Secondary | ICD-10-CM

## 2011-10-26 LAB — HEMOGLOBIN A1C: Hgb A1c MFr Bld: 8 % — ABNORMAL HIGH (ref 4.6–6.5)

## 2011-10-26 LAB — LIPID PANEL
Cholesterol: 245 mg/dL — ABNORMAL HIGH (ref 0–200)
HDL: 47.2 mg/dL (ref >39.00)
VLDL: 22.4 mg/dL (ref 0.0–40.0)

## 2011-10-26 LAB — BASIC METABOLIC PANEL
BUN: 18 mg/dL (ref 6–23)
Creatinine, Ser: 0.7 mg/dL (ref 0.4–1.5)
GFR: 127.81 mL/min (ref >60.00)
Glucose, Bld: 155 mg/dL — ABNORMAL HIGH (ref 70–99)

## 2011-10-26 LAB — LDL CHOLESTEROL, DIRECT: Direct LDL: 169.2 mg/dL

## 2011-10-26 MED ORDER — HYDROCORTISONE 2.5 % EX CREA: TOPICAL_CREAM | Freq: Two times a day (BID) | CUTANEOUS | Status: DC

## 2011-10-26 MED ORDER — CLOBETASOL PROPIONATE 0.05 % EX CREA: TOPICAL_CREAM | Freq: Two times a day (BID) | CUTANEOUS | Status: DC

## 2011-10-26 MED ORDER — METHYLPREDNISOLONE ACETATE PF 80 MG/ML IJ SUSP
120.0000 mg | Freq: Once | INTRAMUSCULAR | Status: AC
  Administered 2011-10-26: 120 mg via INTRAMUSCULAR

## 2011-10-26 NOTE — Patient Instructions (Addendum)
You had the steroid shot today Please stop the triamcinolone cream Take all new medications as prescribed - the clobetasol cream for the areas of the body, and the milder hydrocortisone 2.5 % cream for the face Continue all other medications as before Please go to LAB in the Basement for the blood and/or urine tests to be done today Please call the phone number [phone] (the [employer] System) for results of testing in 2-3 days;  When calling, simply dial the number, and when prompted enter the MRN number above (the Medical Record Number) and the # key, then the message should start. Please return in 6 mo with Lab testing done 3-5 days before

## 2011-10-27 ENCOUNTER — Other Ambulatory Visit: Payer: Self-pay | Admitting: Internal Medicine

## 2011-10-27 MED ORDER — ATORVASTATIN CALCIUM 20 MG PO TABS: 20.0000 mg | ORAL_TABLET | Freq: Every day | ORAL | Status: DC

## 2011-10-27 MED ORDER — METFORMIN HCL 500 MG PO TABS: 500.0000 mg | ORAL_TABLET | Freq: Two times a day (BID) | ORAL | Status: DC

## 2011-10-28 ENCOUNTER — Encounter: Payer: Self-pay | Admitting: Internal Medicine

## 2011-10-28 NOTE — Assessment & Plan Note (Signed)
Tolerated the steroid tx last visit, to cont to monitor for onset polys or cbg > 200,   to f/u any worsening symptoms or concerns  Lab Results  Component Value Date   HGBA1C 8.0* 10/26/2011

## 2011-10-28 NOTE — Assessment & Plan Note (Signed)
stable overall by hx and exam, most recent data reviewed with pt, and pt to continue medical treatment as before  BP Readings from Last 3 Encounters:  10/26/11 130/78  09/26/11 122/70  04/23/11 112/74

## 2011-10-28 NOTE — Progress Notes (Signed)
Subjective:    Patient ID: [name], male    DOB: 07/03/1956, 56 y.o.   MRN: [id number]  HPI Here to f/u;  Recent steroid tx did quite nicely for skin itch and rash /atopic dermatitis, but unfort only lasted improved for about 2 wks, now with symtpoms returned ? Worse than previously. Not yet seen oral surgeon, rheum, or vein clinic.   Pt denies chest pain, increased sob or doe, wheezing, orthopnea, PND, increased LE swelling, palpitations, dizziness or syncope.  Pt denies new neurological symptoms such as new headache, or facial or extremity weakness or numbness   Pt denies polydipsia, polyuria,  Pt states overall good compliance with meds, trying to follow lower cholesterol, diabetic diet, wt overall stable but little exercise however. Pt denies fever, wt loss, night sweats, loss of appetite, or other constitutional symptoms Past Medical History  Diagnosis Date  . ALLERGIC RHINITIS 06/09/2007  . ANXIETY 06/09/2007  . ASTHMA 10/21/2007  . Chronic pain syndrome 05/18/2010  . BENIGN PROSTATIC HYPERTROPHY 06/09/2007  . DEPRESSION 06/03/2007  . DIABETES MELLITUS, TYPE II 06/09/2007  . DYSKINESIA, ESOPHAGUS 06/03/2007  . FIBROMYALGIA 03/11/2008  . FREQUENCY, URINARY 06/28/2008  . GERD 06/09/2007  . HYPERLIPIDEMIA 06/03/2007  . HYPERTENSION 06/03/2007  . INSOMNIA 06/03/2007  . LOW BACK PAIN 06/09/2007  . Morbid obesity 06/03/2007  . NEPHROLITHIASIS, HX OF 10/21/2007  . OBSESSIVE-COMPULSIVE DISORDER 06/03/2007  . ORCHITIS 11/18/2008  . PERIPHERAL EDEMA 10/21/2007  . PLANTAR FASCIITIS, BILATERAL 06/03/2007  . POLYARTHRALGIA 10/21/2007  . Rash and other nonspecific skin eruption 04/12/2009  . SHOULDER PAIN, LEFT 05/18/2010  . SINUSITIS- ACUTE-NOS 12/26/2010  . SLEEP APNEA, OBSTRUCTIVE 10/21/2007  . UNSPECIFIED HYPOTENSION 12/26/2010  . UNSPECIFIED URETHRAL STRICTURE 12/26/2010  . Past myocardial infarction 04/19/2011   Past Surgical History  Procedure Date  . Tonsillectomy     reports that he quit smoking about 4  years ago. His smoking use included Cigarettes. He has a 60 pack-year smoking history. He does not have any smokeless tobacco history on file. He reports that he drinks alcohol. He reports that he does not use illicit drugs. family history includes Allergies in his mother; Asthma in his maternal grandmother and mother; COPD in his mother; and Colon polyps in his mother. Allergies  Allergen Reactions  . Simvastatin     REACTION: myalgia   Current Outpatient Prescriptions on File Prior to Visit  Medication Sig Dispense Refill  . albuterol (PROVENTIL HFA;VENTOLIN HFA) 108 (90 BASE) MCG/ACT inhaler Inhale 2 puffs into the lungs every 4 (four) hours as needed.        [name] aspirin 81 MG EC tablet Take 81 mg by mouth daily.        . carisoprodol (SOMA) 350 MG tablet Take 1 tablet (350 mg total) by mouth 3 (three) times daily as needed for muscle spasms.  90 tablet  3  . doxepin (SINEQUAN) 25 MG capsule 1 by mouth every 8 hours       . Fluticasone-Salmeterol (ADVAIR) 250-50 MCG/DOSE AEPB Inhale 1 puff into the lungs daily.        [name] gabapentin (NEURONTIN) 300 MG capsule Take 1 capsule (300 mg total) by mouth 3 (three) times daily.  90 capsule  5  . ibuprofen (ADVIL,MOTRIN) 800 MG tablet TAKE 1 TABLET BY MOUTH TWICE A DAY  180 tablet  3  . minocycline (MINOCIN,DYNACIN) 100 MG capsule Take 1 capsule (100 mg total) by mouth 2 (two) times daily. With food  60 capsule  2  .  omeprazole (PRILOSEC) 20 MG capsule TAKE 2 CAPSULES BY MOUTH EVERY DAY  180 capsule  3  . predniSONE (DELTASONE) 10 MG tablet Take 1 tablet (10 mg total) by mouth daily. 3 tabs by mouth per day for 3 days, then 2 tabs per day for 3 days, then 1 tab per day for 3 days, then stop   18 tablet  0  . sertraline (ZOLOFT) 100 MG tablet Take 1 tablet (100 mg total) by mouth 2 (two) times daily.  60 tablet  11  . tiotropium (SPIRIVA HANDIHALER) 18 MCG inhalation capsule Place 1 capsule (18 mcg total) into inhaler and inhale daily.  30 capsule  2  .  traZODone (DESYREL) 100 MG tablet TAKE 1/2 TABLET BY MOUTH AT BEDTIME AS NEEDED  45 tablet  1   Review of Systems Review of Systems  Constitutional: Negative for diaphoresis and unexpected weight change.  HENT: Negative for drooling and tinnitus.   Eyes: Negative for photophobia and visual disturbance.  Respiratory: Negative for choking and stridor.   Gastrointestinal: Negative for vomiting and blood in stool.  Genitourinary: Negative for hematuria and decreased urine volume.    Objective:   Physical Exam BP 130/78  Pulse 80  Temp(Src) 97.8 F (36.6 C) (Oral)  Ht 5\' 10"  (1.778 m)  Wt 345 lb 8 oz (156.718 kg)  BMI 49.57 kg/m2  SpO2 93% Physical Exam  VS noted, not ill appearing Constitutional: Pt appears well-developed and well-nourished.  HENT: Head: Normocephalic.  Right Ear: External ear normal.  Left Ear: External ear normal.  Eyes: Conjunctivae and EOM are normal. Pupils are equal, round, and reactive to light.  Neck: Normal range of motion. Neck supple.  Cardiovascular: Normal rate and regular rhythm.   Pulmonary/Chest: Effort normal and breath sounds normal.  Neurological: Pt is alert. No cranial nerve deficit.  Skin: Skin is warm. No erythema. except for numerous erythema areas to face, torso, extremities , nontender, nondraining Psychiatric: Pt behavior is normal. Thought content normal. 1+ nervous    Assessment & Plan:

## 2011-10-28 NOTE — Assessment & Plan Note (Signed)
Moderate recurrent, persistent, has seen derm in the past, for depomedrol IM repaet today, the clobetasol topcial for body/hydrocort 2.5 prn face , d/c triamcinolone prn

## 2011-12-11 ENCOUNTER — Other Ambulatory Visit: Payer: Medicare HMO

## 2011-12-11 ENCOUNTER — Encounter: Payer: Medicare HMO | Admitting: Vascular Surgery

## 2011-12-28 ENCOUNTER — Other Ambulatory Visit: Payer: Self-pay | Admitting: Internal Medicine

## 2012-01-04 ENCOUNTER — Other Ambulatory Visit (INDEPENDENT_AMBULATORY_CARE_PROVIDER_SITE_OTHER): Payer: Self-pay | Admitting: General Surgery

## 2012-01-04 ENCOUNTER — Ambulatory Visit (INDEPENDENT_AMBULATORY_CARE_PROVIDER_SITE_OTHER): Payer: Medicare HMO | Admitting: General Surgery

## 2012-01-04 ENCOUNTER — Other Ambulatory Visit (INDEPENDENT_AMBULATORY_CARE_PROVIDER_SITE_OTHER): Payer: Self-pay

## 2012-01-04 ENCOUNTER — Encounter (INDEPENDENT_AMBULATORY_CARE_PROVIDER_SITE_OTHER): Payer: Self-pay | Admitting: General Surgery

## 2012-01-04 DIAGNOSIS — Z01812 Encounter for preprocedural laboratory examination: Secondary | ICD-10-CM

## 2012-01-04 DIAGNOSIS — G4733 Obstructive sleep apnea (adult) (pediatric): Secondary | ICD-10-CM

## 2012-01-04 DIAGNOSIS — E785 Hyperlipidemia, unspecified: Secondary | ICD-10-CM

## 2012-01-04 NOTE — Progress Notes (Signed)
Subjective:   Morbid obesity  Patient ID: [name], male   DOB: 03-12-1956, 56 y.o.   MRN: [id number]  HPI [name] [id number] y.o.male referred by Dr. Oliver [name]  for surgical treatment for morbid obesity.  he gives a history of progressive obesity since early adulthood despite multiple attempts at medical management. He states that when he was active and working he did control his weight pretty well but over the last 15 years he has been unable to work consistently due to multiple medical problems and has seen his weight steadily increased. his weight has been affecting him in a number of ways including chronic joint pain, sleep apnea, recent onset of diabetes, hyperlipidemia,and reflux.. he has been to our initial information seminar, researched surgical options thoroughly and at this point is opened to surgical options Past Medical History  Diagnosis Date  . ALLERGIC RHINITIS 06/09/2007  . ANXIETY 06/09/2007  . ASTHMA 10/21/2007  . Chronic pain syndrome 05/18/2010  . BENIGN PROSTATIC HYPERTROPHY 06/09/2007  . DEPRESSION 06/03/2007  . DIABETES MELLITUS, TYPE II 06/09/2007  . DYSKINESIA, ESOPHAGUS 06/03/2007  . FIBROMYALGIA 03/11/2008  . FREQUENCY, URINARY 06/28/2008  . GERD 06/09/2007  . HYPERLIPIDEMIA 06/03/2007  . HYPERTENSION 06/03/2007  . INSOMNIA 06/03/2007  . LOW BACK PAIN 06/09/2007  . Morbid obesity 06/03/2007  . NEPHROLITHIASIS, HX OF 10/21/2007  . OBSESSIVE-COMPULSIVE DISORDER 06/03/2007  . ORCHITIS 11/18/2008  . PERIPHERAL EDEMA 10/21/2007  . PLANTAR FASCIITIS, BILATERAL 06/03/2007  . POLYARTHRALGIA 10/21/2007  . Rash and other nonspecific skin eruption 04/12/2009  . SHOULDER PAIN, LEFT 05/18/2010  . SINUSITIS- ACUTE-NOS 12/26/2010  . SLEEP APNEA, OBSTRUCTIVE 10/21/2007  . UNSPECIFIED HYPOTENSION 12/26/2010  . UNSPECIFIED URETHRAL STRICTURE 12/26/2010  . Past myocardial infarction 04/19/2011  . Heart murmur    Past Surgical History  Procedure Date  . Tonsillectomy   . Acne cyst removal     2 on  back    Current Outpatient Prescriptions  Medication Sig Dispense Refill  . albuterol (PROVENTIL HFA;VENTOLIN HFA) 108 (90 BASE) MCG/ACT inhaler Inhale 2 puffs into the lungs every 4 (four) hours as needed.        . ALPRAZolam (XANAX XR) 1 MG 24 hr tablet       . aspirin 81 MG EC tablet Take 81 mg by mouth daily.        . carisoprodol (SOMA) 350 MG tablet Take 1 tablet (350 mg total) by mouth 3 (three) times daily as needed for muscle spasms.  90 tablet  3  . clobetasol cream (TEMOVATE) 0.05 % APPLY TOPICALLY 2 (TWO) TIMES DAILY. TO AFFECTED AREAS ONLY BUT NOT THE FACE  30 g  1  . doxepin (SINEQUAN) 25 MG capsule 1 by mouth every 8 hours       . Fluticasone-Salmeterol (ADVAIR) 250-50 MCG/DOSE AEPB Inhale 1 puff into the lungs daily.        . hydrocortisone 2.5 % cream Apply topically 2 (two) times daily. As needed to facial areas only  30 g  1  . ibuprofen (ADVIL,MOTRIN) 800 MG tablet TAKE 1 TABLET BY MOUTH TWICE A DAY  180 tablet  3  . minocycline (MINOCIN,DYNACIN) 100 MG capsule Take 1 capsule (100 mg total) by mouth 2 (two) times daily. With food  60 capsule  2  . omeprazole (PRILOSEC) 20 MG capsule TAKE 2 CAPSULES BY MOUTH EVERY DAY  180 capsule  3  . sertraline (ZOLOFT) 100 MG tablet Take 1 tablet (100 mg total) by mouth 2 (two)  times daily.  60 tablet  11  . tiotropium (SPIRIVA HANDIHALER) 18 MCG inhalation capsule Place 1 capsule (18 mcg total) into inhaler and inhale daily.  30 capsule  2  . traZODone (DESYREL) 100 MG tablet TAKE 1/2 TABLET BY MOUTH AT BEDTIME AS NEEDED  45 tablet  1  . zolpidem (AMBIEN) 10 MG tablet       . predniSONE (DELTASONE) 10 MG tablet Take 1 tablet (10 mg total) by mouth daily. 3 tabs by mouth per day for 3 days, then 2 tabs per day for 3 days, then 1 tab per day for 3 days, then stop   18 tablet  0   Allergies  Allergen Reactions  . Simvastatin     REACTION: myalgia   History  Substance Use Topics  . Smoking status: Former Smoker -- 2.0 packs/day for 30  years    Types: Cigarettes    Quit date: 10/16/2007  . Smokeless tobacco: Not on file  . Alcohol Use: Yes    Review of Systems  Constitutional: Positive for fatigue. Negative for fever and chills.  HENT: Negative.   Eyes: Positive for visual disturbance.  Respiratory: Positive for shortness of breath and wheezing. Negative for cough and chest tightness.   Cardiovascular: Positive for palpitations. Negative for chest pain and leg swelling.  Gastrointestinal: Positive for constipation. Negative for nausea, vomiting, abdominal pain, diarrhea, blood in stool, abdominal distention, anal bleeding and rectal pain.  Genitourinary: Positive for difficulty urinating.  Musculoskeletal: Positive for myalgias, back pain and arthralgias.  Skin: Positive for rash.  Neurological: Positive for light-headedness.  Psychiatric/Behavioral: Positive for dysphoric mood. The patient is nervous/anxious.        Objective:   Physical Exam General: Alert, morbidly obese caucasian male, in no distress Skin: Warm and dry without rash or infection. HEENT: No palpable masses or thyromegaly. Sclera nonicteric. Pupils equal round and reactive. Oropharynx clear. Lymph nodes: No cervical, supraclavicular, or inguinal nodes palpable. Lungs: Breath sounds clear and equal without increased work of breathing Cardiovascular: Regular rate and rhythm without murmur. No JVD or edema. Peripheral pulses intact. Abdomen: Nondistended. Obese Soft and nontender. No masses palpable. No organomegaly. No palpable hernias. Extremities: No edema or joint swelling or deformity. Skin changes of  chronic venous stasis bilaterllay. Neurologic: Alert and fully oriented. Gait normal.     Assessment:     Patient with progressive morbid obesity unresponsive to multiple efforts at medical management who presents with a BMI of 47.2 and multiple  comorbidities including adult onset diabetes mellitus, obstructive sleep apnea, chronic joint pain,  hypertension, asthma, GERD, dyslipidemia.. I believe there would be very significant medical benefit from surgical weight loss. After our discussion of surgical options currently available the patient has decided to proceed with LAP-BAND placement due to the reasons above. We have discussed the nature of morbid obesity and the risk of remaining obese. We discussed the indications for the procedure, its nature, and expected recovery. The risks of the procedure were discussed in detail including anesthetic complications, bleeding, infection, visceral injury, dysphagia, and long-term risks of mechanical failure, slippage, erosion, esophageal dilatation and failure to lose weight. Rare risk of death was discussed. The patient was given a complete consent form and all questions were answered. We will proceed with preoperative workup including routine lab and x-rays, psychologic and nutrition evaluations, and upper GI series.  I will see the patient back following this evaluation.         Plan:  As above

## 2012-01-05 LAB — LIPID PANEL
Cholesterol: 226 mg/dL — ABNORMAL HIGH (ref 0–200)
LDL Cholesterol: 146 mg/dL — ABNORMAL HIGH (ref 0–99)
VLDL: 37 mg/dL (ref 0–40)

## 2012-01-05 LAB — CBC WITH DIFFERENTIAL/PLATELET
Basophils Absolute: 0 10*3/uL (ref 0.0–0.1)
Basophils Relative: 1 % (ref 0–1)
HCT: 47.6 % (ref 39.0–52.0)
Lymphocytes Relative: 27 % (ref 12–46)
Monocytes Absolute: 0.9 10*3/uL (ref 0.1–1.0)
Neutro Abs: 5.3 10*3/uL (ref 1.7–7.7)
Neutrophils Relative %: 61 % (ref 43–77)
RDW: 13.7 % (ref 11.5–15.5)
WBC: 8.6 10*3/uL (ref 4.0–10.5)

## 2012-01-05 LAB — HEMOGLOBIN A1C
Hgb A1c MFr Bld: 6.7 % — ABNORMAL HIGH (ref <5.7)
Mean Plasma Glucose: 146 mg/dL — ABNORMAL HIGH (ref <117)

## 2012-01-05 LAB — COMPREHENSIVE METABOLIC PANEL
ALT: 22 U/L (ref 0–53)
AST: 20 U/L (ref 0–37)
Albumin: 4.5 g/dL (ref 3.5–5.2)
Alkaline Phosphatase: 66 U/L (ref 39–117)
BUN: 20 mg/dL (ref 6–23)
Calcium: 9.5 mg/dL (ref 8.4–10.5)
Chloride: 103 mEq/L (ref 96–112)
Potassium: 4.7 mEq/L (ref 3.5–5.3)
Sodium: 139 mEq/L (ref 135–145)
Total Protein: 6.5 g/dL (ref 6.0–8.3)

## 2012-01-05 LAB — TSH: TSH: 3.536 u[IU]/mL (ref 0.350–4.500)

## 2012-01-08 ENCOUNTER — Ambulatory Visit: Payer: Medicare HMO | Admitting: Dietician

## 2012-01-17 ENCOUNTER — Ambulatory Visit (HOSPITAL_COMMUNITY)
Admission: RE | Admit: 2012-01-17 | Discharge: 2012-01-17 | Disposition: A | Discharge status: Home / Self Care | Payer: Medicare HMO | Source: Ambulatory Visit | Attending: General Surgery | Admitting: General Surgery

## 2012-01-17 ENCOUNTER — Other Ambulatory Visit: Payer: Self-pay

## 2012-01-17 DIAGNOSIS — K219 Gastro-esophageal reflux disease without esophagitis: Secondary | ICD-10-CM | POA: Insufficient documentation

## 2012-01-17 DIAGNOSIS — E119 Type 2 diabetes mellitus without complications: Secondary | ICD-10-CM | POA: Insufficient documentation

## 2012-01-17 DIAGNOSIS — G4733 Obstructive sleep apnea (adult) (pediatric): Secondary | ICD-10-CM | POA: Insufficient documentation

## 2012-01-17 DIAGNOSIS — Z01812 Encounter for preprocedural laboratory examination: Secondary | ICD-10-CM

## 2012-01-17 DIAGNOSIS — Z6841 Body Mass Index (BMI) 40.0 and over, adult: Secondary | ICD-10-CM | POA: Insufficient documentation

## 2012-01-17 DIAGNOSIS — E785 Hyperlipidemia, unspecified: Secondary | ICD-10-CM | POA: Insufficient documentation

## 2012-01-17 DIAGNOSIS — K449 Diaphragmatic hernia without obstruction or gangrene: Secondary | ICD-10-CM | POA: Insufficient documentation

## 2012-01-28 ENCOUNTER — Other Ambulatory Visit: Payer: Self-pay | Admitting: Internal Medicine

## 2012-01-28 NOTE — Telephone Encounter (Signed)
Done per emr 

## 2012-01-29 ENCOUNTER — Other Ambulatory Visit: Payer: Self-pay

## 2012-01-29 MED ORDER — CARISOPRODOL 350 MG PO TABS: 350.0000 mg | ORAL_TABLET | Freq: Three times a day (TID) | ORAL | Status: DC | PRN

## 2012-01-29 NOTE — Telephone Encounter (Signed)
Faxed hardcopy to pharmacy. 

## 2012-01-29 NOTE — Telephone Encounter (Signed)
Done hardcopy to robin  

## 2012-02-05 ENCOUNTER — Ambulatory Visit (INDEPENDENT_AMBULATORY_CARE_PROVIDER_SITE_OTHER): Payer: Medicare HMO | Admitting: Internal Medicine

## 2012-02-05 ENCOUNTER — Telehealth: Payer: Self-pay

## 2012-02-05 ENCOUNTER — Encounter: Payer: Self-pay | Admitting: Internal Medicine

## 2012-02-05 VITALS — BP 112/62 | HR 79 | Temp 97.5°F | Ht 70.0 in | Wt 338.4 lb

## 2012-02-05 DIAGNOSIS — L2089 Other atopic dermatitis: Secondary | ICD-10-CM

## 2012-02-05 DIAGNOSIS — L209 Atopic dermatitis, unspecified: Secondary | ICD-10-CM

## 2012-02-05 DIAGNOSIS — L259 Unspecified contact dermatitis, unspecified cause: Secondary | ICD-10-CM

## 2012-02-05 DIAGNOSIS — L309 Dermatitis, unspecified: Secondary | ICD-10-CM

## 2012-02-05 DIAGNOSIS — L299 Pruritus, unspecified: Secondary | ICD-10-CM

## 2012-02-05 MED ORDER — HYDROXYZINE HCL 10 MG PO TABS: 10.0000 mg | ORAL_TABLET | Freq: Three times a day (TID) | ORAL | Status: AC | PRN

## 2012-02-05 MED ORDER — METHYLPREDNISOLONE ACETATE 80 MG/ML IJ SUSP
120.0000 mg | Freq: Once | INTRAMUSCULAR | Status: AC
  Administered 2012-02-05: 120 mg via INTRAMUSCULAR

## 2012-02-05 NOTE — Telephone Encounter (Signed)
The patient is stating that Albuterol, Alprazolam, Advair, omeprazole and sertraline need PA or alternative

## 2012-02-05 NOTE — Patient Instructions (Signed)
It was good to see you today. Repeat steroid shot done today Start atarax as needed for itch symptoms, especially bedtime - Your prescription(s) have been submitted to your pharmacy. Please take as directed and contact our office if you believe you are having problem(s) with the medication(s). Test(s) ordered today. Your results will be called to you after review (48-72hours after test completion). If any changes need to be made, you will be notified at that time.

## 2012-02-05 NOTE — Telephone Encounter (Signed)
PA for these meds except for advair is unusual.....  Before we do anything -   Does he have documentation to this effect, or hear it from the pharmacy (can pharmacy verify)?  Does pt have documentation such as preferred med list?

## 2012-02-05 NOTE — Progress Notes (Signed)
Subjective:    Patient ID: [name], male    DOB: 1956-08-11, 56 y.o.   MRN: [id number]  Rash This is a chronic problem. The current episode started more than 1 year ago. The problem has been waxing and waning since onset. The affected locations include the chest, back, left arm and right arm. The rash is characterized by dryness, itchiness and burning. He was exposed to nothing. Pertinent negatives include no diarrhea, fatigue, fever, joint pain, shortness of breath, sore throat or vomiting. Past treatments include moisturizer and anti-itch cream.    Past Medical History  Diagnosis Date  . ALLERGIC RHINITIS   . ANXIETY   . ASTHMA   . Chronic pain syndrome   . BENIGN PROSTATIC HYPERTROPHY   . DEPRESSION   . DIABETES MELLITUS, TYPE II   . DYSKINESIA, ESOPHAGUS   . FIBROMYALGIA   . GERD   . HYPERLIPIDEMIA   . HYPERTENSION   . INSOMNIA   . LOW BACK PAIN   . Morbid obesity   . NEPHROLITHIASIS, HX OF   . OBSESSIVE-COMPULSIVE DISORDER   . ORCHITIS   . PERIPHERAL EDEMA   . PLANTAR FASCIITIS, BILATERAL   . POLYARTHRALGIA   . Rash and other nonspecific skin eruption 04/12/2009  . SLEEP APNEA, OBSTRUCTIVE   . Past myocardial infarction 04/19/2011    Review of Systems  Constitutional: Negative for fever, fatigue and unexpected weight change.  HENT: Negative for sore throat.   Respiratory: Negative for shortness of breath and wheezing.   Gastrointestinal: Negative for vomiting and diarrhea.  Musculoskeletal: Negative for joint pain and joint swelling.  Skin: Positive for rash.       Objective:   Physical Exam BP 112/62  Pulse 79  Temp(Src) 97.5 F (36.4 C) (Oral)  Ht 5\' 10"  (1.778 m)  Wt 338 lb 6.4 oz (153.497 kg)  BMI 48.56 kg/m2  SpO2 94% Wt Readings from Last 3 Encounters:  02/05/12 338 lb 6.4 oz (153.497 kg)  01/04/12 330 lb (149.687 kg)  10/26/11 345 lb 8 oz (156.718 kg)   Constitutional:  He is morbidly obese, but appears well-developed and well-nourished. No  distress.  Cardiovascular: Normal rate, regular rhythm and normal heart sounds.  No murmur heard. no BLE edema Pulmonary/Chest: Effort normal and breath sounds normal. No respiratory distress. no wheezes.  Skin: Dry skin - no petechia or evidence of rash -  No erythema or ulceration.  Psychiatric: he has a normal mood and affect. behavior is normal. Judgment and thought content normal.   Lab Results  Component Value Date   WBC 8.6 01/04/2012   HGB 15.6 01/04/2012   HCT 47.6 01/04/2012   PLT 191 01/04/2012   GLUCOSE 86 01/04/2012   CHOL 226* 01/04/2012   TRIG 186* 01/04/2012   HDL 43 01/04/2012   LDLDIRECT 169.2 10/26/2011   LDLCALC 146* 01/04/2012   ALT 22 01/04/2012   AST 20 01/04/2012   NA 139 01/04/2012   K 4.7 01/04/2012   CL 103 01/04/2012   CREATININE 0.87 01/04/2012   BUN 20 01/04/2012   CO2 25 01/04/2012   TSH 3.536 01/04/2012   PSA 1.62 03/21/2010   HGBA1C 6.7* 01/04/2012   MICROALBUR 0.4 03/22/2011       Assessment & Plan:  Chronic pruritis - arms and back Mild dry skin dermatitis but skin exam largely unremarkable Has been seen here 2x previously in past 6 mo and derm eval (Mcconnell) for same -  derm changed minocycline to septra -  Also takes doxepin for neuropathic component of itch for years  Repeat IM medrol as this provides most relief (as per pt from 12/12 and 1/13 OV) Add atarax tid prn, check Hep C Ab - continue supportive care

## 2012-02-05 NOTE — Telephone Encounter (Signed)
Patient informed to have pharmacy send documentation,.

## 2012-02-11 ENCOUNTER — Ambulatory Visit: Payer: Medicare HMO | Admitting: *Deleted

## 2012-02-15 ENCOUNTER — Telehealth: Payer: Self-pay

## 2012-02-15 MED ORDER — TRAZODONE HCL 100 MG PO TABS: 100.0000 mg | ORAL_TABLET | Freq: Every day | ORAL | Status: DC

## 2012-02-15 NOTE — Telephone Encounter (Signed)
Pt called stating he 1/2 tablet trazodone 100 mg and 1 tablet of zolpidem 10 mg at bedtime for sleep but is still having trouble falling asleep and staying asleep. Pt says pharmacist with [facility] suggested D/C'ing zolpidem and increasing trazodone. Please advise.

## 2012-02-15 NOTE — Telephone Encounter (Signed)
Ok to try this - done per emr

## 2012-02-18 ENCOUNTER — Ambulatory Visit (INDEPENDENT_AMBULATORY_CARE_PROVIDER_SITE_OTHER): Payer: Medicare HMO | Admitting: Internal Medicine

## 2012-02-18 ENCOUNTER — Encounter: Payer: Self-pay | Admitting: Internal Medicine

## 2012-02-18 VITALS — BP 108/70 | HR 86 | Temp 97.3°F | Ht 70.0 in | Wt 337.5 lb

## 2012-02-18 DIAGNOSIS — I10 Essential (primary) hypertension: Secondary | ICD-10-CM

## 2012-02-18 DIAGNOSIS — R059 Cough, unspecified: Secondary | ICD-10-CM | POA: Insufficient documentation

## 2012-02-18 DIAGNOSIS — R05 Cough: Secondary | ICD-10-CM

## 2012-02-18 DIAGNOSIS — R21 Rash and other nonspecific skin eruption: Secondary | ICD-10-CM | POA: Insufficient documentation

## 2012-02-18 DIAGNOSIS — E119 Type 2 diabetes mellitus without complications: Secondary | ICD-10-CM

## 2012-02-18 MED ORDER — PREDNISONE 10 MG PO TABS: 10.0000 mg | ORAL_TABLET | Freq: Every day | ORAL | Status: DC

## 2012-02-18 MED ORDER — METHYLPREDNISOLONE ACETATE 80 MG/ML IJ SUSP
120.0000 mg | Freq: Once | INTRAMUSCULAR | Status: AC
  Administered 2012-02-18: 120 mg via INTRAMUSCULAR

## 2012-02-18 MED ORDER — ZOLPIDEM TARTRATE 10 MG PO TABS: 10.0000 mg | ORAL_TABLET | Freq: Every evening | ORAL | Status: DC | PRN

## 2012-02-18 NOTE — Patient Instructions (Addendum)
OK to stop the sulfa medication You had the steroid shot today Take all new medications as prescribed - the prednisone You are given the hardcopy refill of the Remus Loffler You will be contacted regarding the referral for: dermatology for second opinion Continue all other medications as before Please have the pharmacy call with any refills you may need. Please keep your appointments with your specialists as you have planned - for the lap band evaluation Please return in 1 month for your yearly visit, or sooner if needed, with Lab testing done 3-5 days before

## 2012-02-18 NOTE — Assessment & Plan Note (Addendum)
Unclear etiology, for depomedrol IM, and predpack, refer to different derm for second opinion, d/c bactrim but unclear related to rash

## 2012-02-18 NOTE — Assessment & Plan Note (Signed)
stable overall by hx and exam, most recent data reviewed with pt, and pt to continue medical treatment as before Lab Results  Component Value Date   HGBA1C 6.7* 01/04/2012

## 2012-02-18 NOTE — Assessment & Plan Note (Signed)
stable overall by hx and exam, most recent data reviewed with pt, and pt to continue medical treatment as before BP Readings from Last 3 Encounters:  02/18/12 108/70  02/05/12 112/62  01/04/12 138/92

## 2012-02-18 NOTE — Assessment & Plan Note (Signed)
Most lieky viral vs allergic/asthma flare, tx today as per rash above;  For cxr as well but less likely on the bactrim

## 2012-02-18 NOTE — Progress Notes (Signed)
Subjective:    Patient ID: [name], male    DOB: [birth year], 56 y.o.   MRN: [id number]  HPI  Here to f/u rash - hx as per last visit no change and reviewed with pt -  This is a chronic problem. The current episode started more than 1 year ago. The problem has been waxing and waning since onset. The affected locations include the chest, back, left arm and right arm. The rash is characterized by dryness, itchiness and burning. He was exposed to nothing. Pertinent negatives include no diarrhea, fatigue, fever, joint pain, shortness of breath, sore throat or vomiting. Past treatments include moisturizer and anti-itch cream.  Did see dermatology x 5 without relief, only thing that seems to help is steroid.  No skin biopsy indicated per pt per derm.  Due for lap band soon per pt, to see psychology/nutrition/surgeon one more time  - had ekg, UGI so should happen soon.  Today with also cough and wheezing  - Here with acute onset mild to mod 7 days ST, HA, general weakness and malaise, with prod cough whitish/yellow sputum, but Pt denies chest pain, increased sob or doe, wheezing, orthopnea, PND, increased LE swelling, palpitations, dizziness or syncope except for wheezing in the past few days. Has sweats but no fever.  Also needs generic ambien refill, other meds ok - the proair hfa, doxepin, gabapentin, and atarax.  Also taking bactrim per derm for unclear reason for approx 2 mo.   Past Medical History  Diagnosis Date  . ALLERGIC RHINITIS   . ANXIETY   . ASTHMA   . Chronic pain syndrome   . BENIGN PROSTATIC HYPERTROPHY   . DEPRESSION   . DIABETES MELLITUS, TYPE II   . DYSKINESIA, ESOPHAGUS   . FIBROMYALGIA   . GERD   . HYPERLIPIDEMIA   . HYPERTENSION   . INSOMNIA   . LOW BACK PAIN   . Morbid obesity   . NEPHROLITHIASIS, HX OF   . OBSESSIVE-COMPULSIVE DISORDER   . ORCHITIS   . PERIPHERAL EDEMA   . PLANTAR FASCIITIS, BILATERAL   . POLYARTHRALGIA   . Rash and other nonspecific skin eruption  04/12/2009  . SLEEP APNEA, OBSTRUCTIVE   . Past myocardial infarction 04/19/2011   Past Surgical History  Procedure Date  . Tonsillectomy   . Acne cyst removal     2 on back     reports that he quit smoking about 4 years ago. His smoking use included Cigarettes. He has a 60 pack-year smoking history. He does not have any smokeless tobacco history on file. He reports that he drinks alcohol. He reports that he does not use illicit drugs. family history includes Allergies in his mother; Asthma in his maternal grandmother and mother; COPD in his mother; and Colon polyps in his mother. Allergies  Allergen Reactions  . Simvastatin     REACTION: myalgia  . Statins Other (See Comments)    myalgia   Current Outpatient Prescriptions on File Prior to Visit  Medication Sig Dispense Refill  . albuterol (PROVENTIL HFA;VENTOLIN HFA) 108 (90 BASE) MCG/ACT inhaler Inhale 2 puffs into the lungs every 4 (four) hours as needed.        . ALPRAZolam (XANAX XR) 1 MG 24 hr tablet       . aspirin 81 MG EC tablet Take 81 mg by mouth daily.        . carisoprodol (SOMA) 350 MG tablet Take 1 tablet (350 mg total) by mouth 3 (  three) times daily as needed for muscle spasms.  90 tablet  3  . clobetasol cream (TEMOVATE) 0.05 % APPLY TOPICALLY 2 (TWO) TIMES DAILY. TO AFFECTED AREAS ONLY BUT NOT THE FACE  30 g  1  . Fluticasone-Salmeterol (ADVAIR) 250-50 MCG/DOSE AEPB Inhale 1 puff into the lungs daily.        . hydrocortisone 2.5 % cream Apply topically 2 (two) times daily. As needed to facial areas only  30 g  1  . ibuprofen (ADVIL,MOTRIN) 800 MG tablet TAKE 1 TABLET BY MOUTH TWICE A DAY  180 tablet  3  . minocycline (MINOCIN,DYNACIN) 100 MG capsule Take 1 capsule (100 mg total) by mouth 2 (two) times daily. With food  60 capsule  2  . omeprazole (PRILOSEC) 20 MG capsule TAKE 2 CAPSULES BY MOUTH EVERY DAY  180 capsule  3  . sertraline (ZOLOFT) 100 MG tablet Take 1 tablet (100 mg total) by mouth 2 (two) times daily.  60  tablet  11  . tiotropium (SPIRIVA HANDIHALER) 18 MCG inhalation capsule Place 1 capsule (18 mcg total) into inhaler and inhale daily.  30 capsule  2  . traZODone (DESYREL) 100 MG tablet Take 1 tablet (100 mg total) by mouth at bedtime.  90 tablet  1  . ALPRAZolam (ALPRAZOLAM XR) 1 MG 24 hr tablet Take 1 tablet (1 mg total) by mouth every morning.  30 tablet  5  . ALPRAZolam (XANAX XR) 1 MG 24 hr tablet Take 1 tablet (1 mg total) by mouth every morning.  30 tablet  5  . doxepin (SINEQUAN) 25 MG capsule 1 by mouth every 8 hours       . DISCONTD: atorvastatin (LIPITOR) 20 MG tablet Take 1 tablet (20 mg total) by mouth daily.  30 tablet  11  . DISCONTD: gabapentin (NEURONTIN) 300 MG capsule Take 1 capsule (300 mg total) by mouth 3 (three) times daily.  90 capsule  5  . DISCONTD: metFORMIN (GLUCOPHAGE) 500 MG tablet Take 1 tablet (500 mg total) by mouth 2 (two) times daily with a meal.  60 tablet  11   Review of Systems Review of Systems  Constitutional: Negative for diaphoresis and unexpected weight change.  HENT: Negative for drooling and tinnitus.   Eyes: Negative for photophobia and visual disturbance.  Respiratory: Negative for choking and stridor.   Gastrointestinal: Negative for vomiting and blood in stool.  Genitourinary: Negative for hematuria and decreased urine volume.  Musculoskeletal: Negative for gait problem.  Neurological: Negative for tremors and numbness.  Psychiatric/Behavioral: Negative for decreased concentration. The patient is not hyperactive.       Objective:   Physical Exam BP 108/70  Pulse 86  Temp(Src) 97.3 F (36.3 C) (Oral)  Ht 5\' 10"  (1.778 m)  Wt 337 lb 8 oz (153.089 kg)  BMI 48.43 kg/m2  SpO2 95% Physical Exam  VS noted, not ill apperaing Constitutional: Pt appears well-developed and well-nourished.  HENT: Head: Normocephalic.  Right Ear: External ear normal.  Left Ear: External ear normal.  Bilat tm's mild erythema.  Sinus nontender.  Pharynx mild  erythema Eyes: Conjunctivae and EOM are normal. Pupils are equal, round, and reactive to light.  Neck: Normal range of motion. Neck supple.  Cardiovascular: Normal rate and regular rhythm.   Pulmonary/Chest: Effort normal and breath sounds mild decreased with few wheezes, no rales.  Neurological: Pt is alert. Not confused Skin: Skin is warm. No erythema. Has diffuse mild erythematous rash vague upper cheest and etremities Psychiatric:  Pt behavior is normal. Thought content normal. 1-2+ nervous    Assessment & Plan:

## 2012-02-20 ENCOUNTER — Other Ambulatory Visit: Payer: Self-pay

## 2012-02-20 DIAGNOSIS — L209 Atopic dermatitis, unspecified: Secondary | ICD-10-CM

## 2012-02-20 MED ORDER — HYDROCORTISONE 2.5 % EX CREA: TOPICAL_CREAM | Freq: Two times a day (BID) | CUTANEOUS | Status: DC

## 2012-02-20 MED ORDER — OMEPRAZOLE 20 MG PO CPDR: 20.0000 mg | DELAYED_RELEASE_CAPSULE | Freq: Every day | ORAL | Status: DC

## 2012-02-20 MED ORDER — SERTRALINE HCL 100 MG PO TABS: 100.0000 mg | ORAL_TABLET | Freq: Two times a day (BID) | ORAL | Status: DC

## 2012-02-20 MED ORDER — IBUPROFEN 800 MG PO TABS: 800.0000 mg | ORAL_TABLET | Freq: Two times a day (BID) | ORAL | Status: DC

## 2012-02-25 ENCOUNTER — Other Ambulatory Visit: Payer: Self-pay

## 2012-02-25 MED ORDER — ALPRAZOLAM ER 1 MG PO TB24: ORAL_TABLET | ORAL | Status: DC

## 2012-02-25 NOTE — Telephone Encounter (Signed)
Done hardcopy to robin  

## 2012-02-25 NOTE — Telephone Encounter (Signed)
Faxed hardcopy to pharmacy. 

## 2012-02-29 ENCOUNTER — Other Ambulatory Visit: Payer: Self-pay

## 2012-02-29 DIAGNOSIS — R21 Rash and other nonspecific skin eruption: Secondary | ICD-10-CM

## 2012-02-29 MED ORDER — PREDNISONE 10 MG PO TABS: 10.0000 mg | ORAL_TABLET | Freq: Every day | ORAL | Status: DC

## 2012-03-03 NOTE — Telephone Encounter (Signed)
Faxed hardcopy to pharmacy. 

## 2012-03-06 ENCOUNTER — Encounter: Payer: Medicare HMO | Attending: General Surgery | Admitting: *Deleted

## 2012-03-06 ENCOUNTER — Encounter: Payer: Self-pay | Admitting: *Deleted

## 2012-03-06 DIAGNOSIS — Z01818 Encounter for other preprocedural examination: Secondary | ICD-10-CM | POA: Insufficient documentation

## 2012-03-06 DIAGNOSIS — Z713 Dietary counseling and surveillance: Secondary | ICD-10-CM | POA: Insufficient documentation

## 2012-03-06 NOTE — Patient Instructions (Signed)
   Follow Pre-Op Nutrition Goals to prepare for LAGB Surgery.   Call the [facility] at [phone] once you have been given your surgery date to enrolled in the Pre-Op Nutrition Class. You will need to attend this nutrition class 3-4 weeks prior to your surgery. 

## 2012-03-06 NOTE — Progress Notes (Signed)
  Pre-Op Assessment Visit:  Pre-Operative LAGB Surgery  Medical Nutrition Therapy:  Appt start time: 0945   End time:  1045.  Patient was seen on 03/06/2012 for Pre-Operative LAGB Nutrition Assessment. Assessment and letter of approval faxed to [facility] Bariatric Surgery Program coordinator on 03/06/2012.  Approval letter sent to [facility] Scan center and will be available in the chart under the media tab.  Handouts given during visit include:  Pre-Op Goals   Bariatric Surgery Protein Shakes  Patient to call for Pre-Op and Post-Op Nutrition Education at the [facility] when surgery is scheduled.

## 2012-03-11 ENCOUNTER — Telehealth: Payer: Self-pay

## 2012-03-11 DIAGNOSIS — L209 Atopic dermatitis, unspecified: Secondary | ICD-10-CM

## 2012-03-11 NOTE — Telephone Encounter (Signed)
A user error has taken place: encounter opened in error, closed for administrative reasons.

## 2012-03-14 ENCOUNTER — Other Ambulatory Visit (INDEPENDENT_AMBULATORY_CARE_PROVIDER_SITE_OTHER): Payer: Self-pay | Admitting: General Surgery

## 2012-03-16 ENCOUNTER — Other Ambulatory Visit: Payer: Self-pay | Admitting: Internal Medicine

## 2012-03-20 ENCOUNTER — Other Ambulatory Visit (INDEPENDENT_AMBULATORY_CARE_PROVIDER_SITE_OTHER): Payer: Self-pay | Admitting: General Surgery

## 2012-03-21 ENCOUNTER — Encounter: Payer: Self-pay | Admitting: Internal Medicine

## 2012-03-21 ENCOUNTER — Ambulatory Visit (INDEPENDENT_AMBULATORY_CARE_PROVIDER_SITE_OTHER): Payer: Medicare HMO | Admitting: Internal Medicine

## 2012-03-21 ENCOUNTER — Other Ambulatory Visit (INDEPENDENT_AMBULATORY_CARE_PROVIDER_SITE_OTHER): Payer: Medicare HMO

## 2012-03-21 VITALS — BP 122/80 | HR 91 | Temp 97.0°F | Ht 70.0 in | Wt 341.0 lb

## 2012-03-21 DIAGNOSIS — E119 Type 2 diabetes mellitus without complications: Secondary | ICD-10-CM

## 2012-03-21 DIAGNOSIS — Z7901 Long term (current) use of anticoagulants: Secondary | ICD-10-CM

## 2012-03-21 DIAGNOSIS — I4891 Unspecified atrial fibrillation: Secondary | ICD-10-CM

## 2012-03-21 DIAGNOSIS — L2089 Other atopic dermatitis: Secondary | ICD-10-CM

## 2012-03-21 DIAGNOSIS — I480 Paroxysmal atrial fibrillation: Secondary | ICD-10-CM | POA: Insufficient documentation

## 2012-03-21 DIAGNOSIS — Z0000 Encounter for general adult medical examination without abnormal findings: Secondary | ICD-10-CM

## 2012-03-21 DIAGNOSIS — R21 Rash and other nonspecific skin eruption: Secondary | ICD-10-CM

## 2012-03-21 LAB — HEMOGLOBIN A1C: Hgb A1c MFr Bld: 6.6 % — ABNORMAL HIGH (ref 4.6–6.5)

## 2012-03-21 LAB — LDL CHOLESTEROL, DIRECT: Direct LDL: 130.6 mg/dL

## 2012-03-21 LAB — LIPID PANEL
Cholesterol: 204 mg/dL — ABNORMAL HIGH (ref 0–200)
HDL: 60.4 mg/dL (ref >39.00)
Total CHOL/HDL Ratio: 3
Triglycerides: 124 mg/dL (ref 0.0–149.0)

## 2012-03-21 MED ORDER — TRAZODONE HCL 100 MG PO TABS: 100.0000 mg | ORAL_TABLET | Freq: Every day | ORAL | Status: DC

## 2012-03-21 MED ORDER — FLUTICASONE-SALMETEROL 250-50 MCG/DOSE IN AEPB: 1.0000 | INHALATION_SPRAY | Freq: Every day | RESPIRATORY_TRACT | Status: DC

## 2012-03-21 MED ORDER — DOXEPIN HCL 25 MG PO CAPS: 25.0000 mg | ORAL_CAPSULE | Freq: Every day | ORAL | Status: DC

## 2012-03-21 MED ORDER — HYDROXYZINE HCL 25 MG PO TABS: 25.0000 mg | ORAL_TABLET | Freq: Three times a day (TID) | ORAL | Status: AC | PRN

## 2012-03-21 MED ORDER — METHYLPREDNISOLONE ACETATE 80 MG/ML IJ SUSP
120.0000 mg | Freq: Once | INTRAMUSCULAR | Status: AC
  Administered 2012-03-21: 120 mg via INTRAMUSCULAR

## 2012-03-21 MED ORDER — METFORMIN HCL 500 MG PO TABS: 500.0000 mg | ORAL_TABLET | Freq: Two times a day (BID) | ORAL | Status: DC

## 2012-03-21 MED ORDER — OMEPRAZOLE 20 MG PO CPDR: 20.0000 mg | DELAYED_RELEASE_CAPSULE | Freq: Two times a day (BID) | ORAL | Status: DC

## 2012-03-21 MED ORDER — IBUPROFEN 800 MG PO TABS: 800.0000 mg | ORAL_TABLET | Freq: Two times a day (BID) | ORAL | Status: DC

## 2012-03-21 MED ORDER — ALBUTEROL SULFATE HFA 108 (90 BASE) MCG/ACT IN AERS: 2.0000 | INHALATION_SPRAY | RESPIRATORY_TRACT | Status: DC | PRN

## 2012-03-21 MED ORDER — TIOTROPIUM BROMIDE MONOHYDRATE 18 MCG IN CAPS: 18.0000 ug | ORAL_CAPSULE | Freq: Every day | RESPIRATORY_TRACT | Status: DC

## 2012-03-21 MED ORDER — METOPROLOL TARTRATE 50 MG PO TABS: 50.0000 mg | ORAL_TABLET | Freq: Two times a day (BID) | ORAL | Status: DC

## 2012-03-21 MED ORDER — SERTRALINE HCL 100 MG PO TABS: 100.0000 mg | ORAL_TABLET | Freq: Two times a day (BID) | ORAL | Status: DC

## 2012-03-21 MED ORDER — WARFARIN SODIUM 5 MG PO TABS: 5.0000 mg | ORAL_TABLET | Freq: Every day | ORAL | Status: DC

## 2012-03-21 NOTE — Assessment & Plan Note (Signed)
Hx of occurrence approx 10 yrs ago; ECG reviewed as per emr

## 2012-03-21 NOTE — Assessment & Plan Note (Addendum)
ECG reviewed as per emr, asympt, volume ok, but will need coumadin start, as well as rate improvement with metoprolol 50 bid, and refer to cardiology;  Had echo July 2012 - will hold on further at this time

## 2012-03-21 NOTE — Progress Notes (Signed)
Subjective:    Patient ID: [name], male    DOB: [birth year], 56 y.o.   MRN: [id number]  HPI  Here for wellness and f/u;  Overall doing ok;  Pt denies CP, worsening SOB, DOE, wheezing, orthopnea, PND, worsening LE edema, palpitations, dizziness or syncope.  Pt denies neurological change such as new Headache, facial or extremity weakness.  Pt denies polydipsia, polyuria, or low sugar symptoms. Pt states overall good compliance with treatment and medications, good tolerability, and trying to follow lower cholesterol diet.  Pt denies worsening depressive symptoms, suicidal ideation or panic. No fever, wt loss, night sweats, loss of appetite, or other constitutional symptoms.  Pt states good ability with ADL's, low fall risk, home safety reviewed and adequate, no significant changes in hearing or vision, and occasionally active with exercise.  Does have marked itching and rash of unclear etiology per derm acting up again, better with steroid tx in the past.  Has ongoing pain to the left knee with effusion today, and plans to f/u with orthopedic after upcoming proposed lap band procedure for wt loss.  Wt has increased from July 2012  - 324, to current 341.  Pt is unaware of any palp's today or irreg heart beat. Past Medical History  Diagnosis Date  . ALLERGIC RHINITIS   . ANXIETY   . ASTHMA   . Chronic pain syndrome   . BENIGN PROSTATIC HYPERTROPHY   . DEPRESSION   . DIABETES MELLITUS, TYPE II   . DYSKINESIA, ESOPHAGUS   . FIBROMYALGIA   . GERD   . HYPERLIPIDEMIA   . HYPERTENSION   . INSOMNIA   . LOW BACK PAIN   . Morbid obesity   . NEPHROLITHIASIS, HX OF   . OBSESSIVE-COMPULSIVE DISORDER   . ORCHITIS   . PERIPHERAL EDEMA   . PLANTAR FASCIITIS, BILATERAL   . POLYARTHRALGIA   . Rash and other nonspecific skin eruption 04/12/2009  . SLEEP APNEA, OBSTRUCTIVE   . Past myocardial infarction 04/19/2011   Past Surgical History  Procedure Date  . Tonsillectomy   . Acne cyst removal     2 on  back     reports that he quit smoking about 4 years ago. His smoking use included Cigarettes. He has a 60 pack-year smoking history. He does not have any smokeless tobacco history on file. He reports that he drinks alcohol. He reports that he does not use illicit drugs. family history includes Allergies in his mother; Asthma in his maternal grandmother and mother; COPD in his mother; and Colon polyps in his mother. Allergies  Allergen Reactions  . Simvastatin     REACTION: myalgia  . Statins Other (See Comments)    myalgia   Current Outpatient Prescriptions on File Prior to Visit  Medication Sig Dispense Refill  . albuterol (PROVENTIL HFA;VENTOLIN HFA) 108 (90 BASE) MCG/ACT inhaler Inhale 2 puffs into the lungs every 4 (four) hours as needed.  1 Inhaler  11  . ALPRAZolam (XANAX XR) 1 MG 24 hr tablet 1 tab by mouth per day as needed  30 tablet  5  . aspirin 81 MG EC tablet Take 81 mg by mouth daily.        . carisoprodol (SOMA) 350 MG tablet Take 1 tablet (350 mg total) by mouth 3 (three) times daily as needed for muscle spasms.  90 tablet  3  . clobetasol cream (TEMOVATE) 0.05 % APPLY TOPICALLY 2 (TWO) TIMES DAILY. TO AFFECTED AREAS ONLY BUT NOT THE FACE  30 g  1  . doxepin (SINEQUAN) 25 MG capsule Take 1 capsule (25 mg total) by mouth at bedtime. 1 by mouth every 8 hours  90 capsule  3  . Fluticasone-Salmeterol (ADVAIR) 250-50 MCG/DOSE AEPB Inhale 1 puff into the lungs daily.  180 each  3  . hydrocortisone 2.5 % cream Apply topically 2 (two) times daily. As needed to facial areas only  30 g  3  . ibuprofen (ADVIL,MOTRIN) 800 MG tablet Take 1 tablet (800 mg total) by mouth 2 (two) times daily. prn  180 tablet  3  . omeprazole (PRILOSEC) 20 MG capsule Take 1 capsule (20 mg total) by mouth 2 (two) times daily.  180 capsule  3  . sertraline (ZOLOFT) 100 MG tablet Take 1 tablet (100 mg total) by mouth 2 (two) times daily.  180 tablet  3  . tiotropium (SPIRIVA HANDIHALER) 18 MCG inhalation capsule  Place 1 capsule (18 mcg total) into inhaler and inhale daily.  90 capsule  3  . traZODone (DESYREL) 100 MG tablet Take 1 tablet (100 mg total) by mouth at bedtime.  90 tablet  1  . metFORMIN (GLUCOPHAGE) 500 MG tablet Take 1 tablet (500 mg total) by mouth 2 (two) times daily with a meal.  180 tablet  3  . metoprolol (LOPRESSOR) 50 MG tablet Take 1 tablet (50 mg total) by mouth 2 (two) times daily.  180 tablet  3  . warfarin (COUMADIN) 5 MG tablet Take 1 tablet (5 mg total) by mouth daily.  120 tablet  3  . zolpidem (AMBIEN) 10 MG tablet Take 1 tablet (10 mg total) by mouth at bedtime as needed for sleep.  30 tablet  5  . DISCONTD: atorvastatin (LIPITOR) 20 MG tablet Take 1 tablet (20 mg total) by mouth daily.  30 tablet  11  . DISCONTD: gabapentin (NEURONTIN) 300 MG capsule Take 1 capsule (300 mg total) by mouth 3 (three) times daily.  90 capsule  5   Review of Systems Review of Systems  Constitutional: Negative for diaphoresis, activity change, appetite change and unexpected weight change.  HENT: Negative for hearing loss, ear pain, facial swelling, mouth sores and neck stiffness.   Eyes: Negative for pain, redness and visual disturbance.  Respiratory: Negative for shortness of breath and wheezing.   Cardiovascular: Negative for chest pain and palpitations.  Gastrointestinal: Negative for diarrhea, blood in stool, abdominal distention and rectal pain.  Genitourinary: Negative for hematuria, flank pain and decreased urine volume.  Musculoskeletal: Negative for myalgias and joint swelling.except for the left knee  Skin: Negative for color change and wound.  Neurological: Negative for syncope and numbness.  Hematological: Negative for adenopathy.  Psychiatric/Behavioral: Negative for hallucinations, self-injury, decreased concentration and agitation.      Objective:   Physical Exam BP 122/80  Pulse 91  Temp(Src) 97 F (36.1 C) (Oral)  Ht 5\' 10"  (1.778 m)  Wt 341 lb (154.677 kg)  BMI  48.93 kg/m2  SpO2 96% Physical Exam  VS noted Constitutional: Pt is oriented to person, place, and time. Appears well-developed and well-nourished.  HENT:  Head: Normocephalic and atraumatic.  Right Ear: External ear normal.  Left Ear: External ear normal.  Nose: Nose normal.  Mouth/Throat: Oropharynx is clear and moist.  Eyes: Conjunctivae and EOM are normal. Pupils are equal, round, and reactive to light.  Neck: Normal range of motion. Neck supple. No JVD present. No tracheal deviation present.  Cardiovascular: tachy rate, irregular rhythm, normal heart sounds and  intact distal pulses.   Pulmonary/Chest: Effort normal and breath sounds normal.  Abdominal: Soft. Bowel sounds are normal. There is no tenderness.  Musculoskeletal: Normal range of motion. Exhibits 1+ left knee effusion, and bilat trace edema to LE's  Lymphadenopathy:  Has no cervical adenopathy.  Neurological: Pt is alert and oriented to person, place, and time. Pt has normal reflexes. No cranial nerve deficit.  Skin: Skin is warm and dry. No rash noted.  Psychiatric: . Behavior is normal. 1+ nervous     Assessment & Plan:

## 2012-03-21 NOTE — Assessment & Plan Note (Signed)
?   Inflammatory, for depomedrol IM, and atarax prn itch, has f/u with derm June 19

## 2012-03-21 NOTE — Patient Instructions (Addendum)
Take all new medications as prescribed - the metoprolol 50 twice per day Your last echo and stress test was 2012, but we will need to refer to Cardiology I have sent a note to let Dr Johna Sheriff know about the new problem, and I think the lap band may need to be postponed You had the steroid shot today Please start the warfarin (coumadin) at 10 mg (two of the 5 mg pills) for the first 2 days, then 5 mg per day after that Please return for a blood test for the coumadin to the lab in the Basement on Tues next week (or mon or wed if not able) Continue all other medications as before All of your medications were refilled today except for the skin creams Please call if you feel you need to see orthopedic for the left knee such as the knee "giving out" Please keep your appointments with your specialists as you have planned - rheumatology (for the knee), dermatology, and Dr Johna Sheriff Please go to LAB in the Basement for the blood and/or urine tests to be done today You will be contacted by phone if any changes need to be made immediately.  Otherwise, you will receive a letter about your results with an explanation. Please return in 6 mo with Lab testing done 3-5 days before, or sooner if needed; or sooner if not able for some reason to see cardiology in the next 1-2 wks

## 2012-03-22 ENCOUNTER — Encounter: Payer: Self-pay | Admitting: Internal Medicine

## 2012-03-22 NOTE — Assessment & Plan Note (Signed)
Given the afib will need coumadin start as there is no contraindication;  Length of tx will depend on need depending on tx of afib

## 2012-03-22 NOTE — Assessment & Plan Note (Signed)
Overall doing well, age appropriate education and counseling updated, referrals for preventative services and immunizations addressed, dietary and smoking counseling addressed, most recent labs and ECG reviewed.  I have personally reviewed and have noted: 1) the patient's medical and social history 2) The pt's use of alcohol, tobacco, and illicit drugs 3) The patient's current medications and supplements 4) Functional ability including ADL's, fall risk, home safety risk, hearing and visual impairment 5) Diet and physical activities 6) Evidence for depression or mood disorder 7) The patient's height, weight, and BMI have been recorded in the chart I have made referrals, and provided counseling and education based on review of the above  

## 2012-03-22 NOTE — Assessment & Plan Note (Signed)
stable overall by hx and exam, most recent data reviewed with pt, and pt to continue medical treatment as before Lab Results  Component Value Date   HGBA1C 6.6* 03/21/2012

## 2012-03-25 ENCOUNTER — Other Ambulatory Visit (INDEPENDENT_AMBULATORY_CARE_PROVIDER_SITE_OTHER): Payer: Medicare HMO

## 2012-03-25 DIAGNOSIS — Z7901 Long term (current) use of anticoagulants: Secondary | ICD-10-CM

## 2012-03-26 ENCOUNTER — Telehealth: Payer: Self-pay

## 2012-03-26 DIAGNOSIS — Z7901 Long term (current) use of anticoagulants: Secondary | ICD-10-CM

## 2012-03-26 NOTE — Telephone Encounter (Signed)
Put order in for PT/INR future expected April 08, 2012.

## 2012-04-08 ENCOUNTER — Other Ambulatory Visit (INDEPENDENT_AMBULATORY_CARE_PROVIDER_SITE_OTHER): Payer: Medicare HMO

## 2012-04-08 ENCOUNTER — Encounter: Payer: Self-pay | Admitting: Internal Medicine

## 2012-04-08 DIAGNOSIS — Z7901 Long term (current) use of anticoagulants: Secondary | ICD-10-CM

## 2012-04-08 DIAGNOSIS — Z125 Encounter for screening for malignant neoplasm of prostate: Secondary | ICD-10-CM

## 2012-04-08 DIAGNOSIS — E119 Type 2 diabetes mellitus without complications: Secondary | ICD-10-CM

## 2012-04-08 DIAGNOSIS — Z0000 Encounter for general adult medical examination without abnormal findings: Secondary | ICD-10-CM

## 2012-04-08 LAB — CBC WITH DIFFERENTIAL/PLATELET
Eosinophils Relative: 0.9 % (ref 0.0–5.0)
HCT: 46.3 % (ref 39.0–52.0)
Lymphocytes Relative: 21.1 % (ref 12.0–46.0)
Lymphs Abs: 1.8 10*3/uL (ref 0.7–4.0)
Monocytes Relative: 9.1 % (ref 3.0–12.0)
Neutrophils Relative %: 68.2 % (ref 43.0–77.0)
Platelets: 210 10*3/uL (ref 150.0–400.0)
WBC: 8.4 10*3/uL (ref 4.5–10.5)

## 2012-04-08 LAB — URINALYSIS, ROUTINE W REFLEX MICROSCOPIC
Leukocytes, UA: NEGATIVE
Nitrite: NEGATIVE
Specific Gravity, Urine: 1.015 (ref 1.000–1.030)
Urobilinogen, UA: 0.2 (ref 0.0–1.0)
pH: 6 (ref 5.0–8.0)

## 2012-04-08 LAB — BASIC METABOLIC PANEL
BUN: 14 mg/dL (ref 6–23)
GFR: 74.03 mL/min (ref >60.00)
Glucose, Bld: 90 mg/dL (ref 70–99)
Potassium: 4.3 mEq/L (ref 3.5–5.1)

## 2012-04-08 LAB — TSH: TSH: 1.57 u[IU]/mL (ref 0.35–5.50)

## 2012-04-08 LAB — HEPATIC FUNCTION PANEL
AST: 27 U/L (ref 0–37)
Albumin: 3.8 g/dL (ref 3.5–5.2)
Total Protein: 6.4 g/dL (ref 6.0–8.3)

## 2012-04-09 ENCOUNTER — Telehealth: Payer: Self-pay

## 2012-04-09 DIAGNOSIS — Z7901 Long term (current) use of anticoagulants: Secondary | ICD-10-CM

## 2012-04-09 NOTE — Telephone Encounter (Signed)
Put order in for PT/INR 

## 2012-04-10 ENCOUNTER — Encounter: Payer: Medicare HMO | Attending: General Surgery | Admitting: *Deleted

## 2012-04-10 DIAGNOSIS — Z713 Dietary counseling and surveillance: Secondary | ICD-10-CM | POA: Insufficient documentation

## 2012-04-10 DIAGNOSIS — Z01818 Encounter for other preprocedural examination: Secondary | ICD-10-CM | POA: Insufficient documentation

## 2012-04-10 NOTE — Progress Notes (Addendum)
  Bariatric Class:  Appt start time: 0830 end time:  0930.  Pre-Operative Nutrition Class  Patient was seen on 04/10/2012 for Pre-Operative Bariatric Surgery Education at the [facility].  Surgery date: 04/29/12 Surgery type: LAGB  Samples given per MNT protocol: Bariatric Advantage Multivitamin  Lot # [id number] Exp:09/13  Bariatric Advantage Calcium Citrate Lot # [id number] Exp:12/13  Celebrate Vitamins Multivitamin Lot # [id number] Exp: 01/15  Celebrate Vitamins Multi-Complete Lot # [id number] Exp: 01/14  Celebrate Vitamins Calcium Citrate Lot # [id number] Exp:10/14  Celebrate Vitamins Iron + C (30 mg) Lot #  [id number] Exp: 07/14  Celebrate Vitamins B12 Sublingual Lot # [id number] Exp: 07/14  Unjury Protein Powder Chocolate Splendor: Lot # [id number]; Exp: 08/14 Chicken Soup: Lot # [id number]; Exp: 09/14  The following the learning objective met by the patient during this course:  Identifies Pre-Op Dietary Goals and will begin 2 weeks pre-operatively  Identifies appropriate sources of fluids and proteins   States protein recommendations and appropriate sources pre and post-operatively  Identifies Post-Operative Dietary Goals and will follow for 2 weeks post-operatively  Identifies appropriate multivitamin and calcium sources  Describes the need for physical activity post-operatively and will follow MD recommendations  States when to call healthcare provider regarding medication questions or post-operative complications  Handouts given during class include:  Pre-Op Bariatric Surgery Diet Handout  Protein Shake Handout  Post-Op Bariatric Surgery Nutrition Handout  BELT Program Information Flyer  Support Group Information Flyer  Follow-Up Plan: Patient will follow-up at [name] 2 weeks post operatively for diet advancement per MD.

## 2012-04-10 NOTE — Patient Instructions (Signed)
Follow:   Pre-Op Diet per MD 2 weeks prior to surgery  Phase 2- Liquids (clear/full) 2 weeks after surgery  Vitamin/Mineral/Calcium guidelines for purchasing bariatric supplements  Exercise guidelines pre and post-op per MD  Follow-up at [facility] in 2 weeks post-op for diet advancement. Contact Alberta Lenhard as needed with questions/concerns. 

## 2012-04-15 ENCOUNTER — Encounter: Payer: Self-pay | Admitting: Internal Medicine

## 2012-04-15 ENCOUNTER — Ambulatory Visit (HOSPITAL_COMMUNITY): Payer: Medicare HMO | Attending: Cardiology | Admitting: Radiology

## 2012-04-15 ENCOUNTER — Telehealth (INDEPENDENT_AMBULATORY_CARE_PROVIDER_SITE_OTHER): Payer: Self-pay | Admitting: General Surgery

## 2012-04-15 ENCOUNTER — Encounter (HOSPITAL_COMMUNITY): Payer: Self-pay | Admitting: Pharmacy Technician

## 2012-04-15 ENCOUNTER — Ambulatory Visit (INDEPENDENT_AMBULATORY_CARE_PROVIDER_SITE_OTHER): Payer: Medicare HMO | Admitting: Internal Medicine

## 2012-04-15 ENCOUNTER — Encounter: Payer: Self-pay | Admitting: *Deleted

## 2012-04-15 VITALS — BP 124/84 | HR 106 | Ht 70.0 in | Wt 340.8 lb

## 2012-04-15 DIAGNOSIS — I4891 Unspecified atrial fibrillation: Secondary | ICD-10-CM | POA: Insufficient documentation

## 2012-04-15 DIAGNOSIS — I428 Other cardiomyopathies: Secondary | ICD-10-CM

## 2012-04-15 DIAGNOSIS — I10 Essential (primary) hypertension: Secondary | ICD-10-CM

## 2012-04-15 DIAGNOSIS — E785 Hyperlipidemia, unspecified: Secondary | ICD-10-CM | POA: Insufficient documentation

## 2012-04-15 DIAGNOSIS — I429 Cardiomyopathy, unspecified: Secondary | ICD-10-CM | POA: Insufficient documentation

## 2012-04-15 DIAGNOSIS — I5022 Chronic systolic (congestive) heart failure: Secondary | ICD-10-CM

## 2012-04-15 DIAGNOSIS — R0989 Other specified symptoms and signs involving the circulatory and respiratory systems: Secondary | ICD-10-CM | POA: Insufficient documentation

## 2012-04-15 DIAGNOSIS — I509 Heart failure, unspecified: Secondary | ICD-10-CM | POA: Insufficient documentation

## 2012-04-15 DIAGNOSIS — E119 Type 2 diabetes mellitus without complications: Secondary | ICD-10-CM | POA: Insufficient documentation

## 2012-04-15 DIAGNOSIS — R0609 Other forms of dyspnea: Secondary | ICD-10-CM | POA: Insufficient documentation

## 2012-04-15 DIAGNOSIS — I5032 Chronic diastolic (congestive) heart failure: Secondary | ICD-10-CM | POA: Insufficient documentation

## 2012-04-15 DIAGNOSIS — I517 Cardiomegaly: Secondary | ICD-10-CM | POA: Insufficient documentation

## 2012-04-15 MED ORDER — DIGOXIN 250 MCG PO TABS: 0.2500 mg | ORAL_TABLET | Freq: Every day | ORAL | Status: DC

## 2012-04-15 MED ORDER — FUROSEMIDE 40 MG PO TABS: 40.0000 mg | ORAL_TABLET | Freq: Every day | ORAL | Status: DC

## 2012-04-15 NOTE — Progress Notes (Signed)
Echocardiogram performed.  

## 2012-04-15 NOTE — Assessment & Plan Note (Signed)
As above.

## 2012-04-15 NOTE — Assessment & Plan Note (Signed)
The patient has atrial fibrillation with a rapid response. He has a known history of cardiomyopathy is also concerning for ischemia and prior MI. He has heart failure symptoms developing over recent months he asked for palpitations strongly suggestive of prolonged atrial fibrillation contributing to his symptoms.  The first therapeutic target should be slowing his heart rate which is most successfully accomplished by conversion to sinus rhythm. His INRs have been therapeutic over the last 3 weeks and we'll undertake cardioversion. Hopefully he will hold; he may need antiarrhythmic therapy and to best assess the possibilities we will need to look at an echo for r left ventricular function as well as left atrial dimension.  I will anticipate that he has left ventricular dysfunction; he may well have ischemic heart disease as suggested by his remote Myoview and I would anticipate that we would undertake catheterization 3-4 weeks following his cardioversion.    In the interim, we'll begin him on digoxin for augmented rate control given his normal renal function. We have discussed the use of NOACs. Further discussed the need to postpone his surgery to relieve his congestive heart failure. He understands and is agreeable.  Begin him on diuretics for volume overload.

## 2012-04-15 NOTE — Progress Notes (Signed)
CARDIOLOGY CONSULT NOTE  Patient ID: [name], MRN: [id number], DOB/AGE: Jan 30, 1956 56 y.o. Admit date: (Not on file) Date of Consult: 04/15/2012  Primary Physician: [name], MD Primary Cardiologist: new  Chief Complaint: afib   HPI [name] is a 56 y.o. male : Seen at the request of Dr. Jonny Ruiz because of newly identified(newly reidentified) atrial fibrillation. It was first noted in 2001. He had no recurrent problems until it was noted on physical examination last month. The patient notes significant worsening of his exercise tolerance over the last 3-4 months characterized by fatigue, dyspnea, and lightheadedness. He has had more edema. He's had no palpitations.  Myoview undertaken a year ago demonstrated a small inferior wall infarct involving also the apex with depressed left ventricular function and an ejection fraction of 44%. There was no ischemia.  Thromboembolic risk factors are notable for hypertension-1, diabetes-1, congestive failure-#1, possible vascular disease-1 for a CHADS-VASc score of 3-4. Warfarin and Lopressor were initiated at his last visit with Dr. Jonny Ruiz. He has noted no significant improvement.  He has a history of remote syncope. This occurred with his atrial fibrillation.  He has sleep apnea for which therapy was initiated 3 months ago.  He is currently scheduled to undergo lap band surgery later this month.  Past Medical History  Diagnosis Date  . ALLERGIC RHINITIS   . ANXIETY   . ASTHMA   . Chronic pain syndrome   . BENIGN PROSTATIC HYPERTROPHY   . DEPRESSION   . DIABETES MELLITUS, TYPE II   . DYSKINESIA, ESOPHAGUS   . FIBROMYALGIA   . GERD   . HYPERLIPIDEMIA   . HYPERTENSION   . INSOMNIA   . LOW BACK PAIN   . Morbid obesity     Anticipated LAP-BAND surgery  . NEPHROLITHIASIS, HX OF   . OBSESSIVE-COMPULSIVE DISORDER   . ORCHITIS   . PERIPHERAL EDEMA   . PLANTAR FASCIITIS, BILATERAL   . POLYARTHRALGIA   . Rash and other nonspecific  skin eruption 04/12/2009  . SLEEP APNEA, OBSTRUCTIVE   . Past myocardial infarction 04/19/2011  . Atrial fibrillation     Initially identified 2001, recurring 2013  . Cardiomyopathy     Myoview with prior infarct- 2012; repeat pending  . Syncope     Associated with atrial fibrillation 2001      Surgical History:  Past Surgical History  Procedure Date  . Tonsillectomy   . Acne cyst removal     2 on back      Home Meds: Prior to Admission medications   Medication Sig Start Date End Date Taking? Authorizing Provider  albuterol (PROVENTIL HFA;VENTOLIN HFA) 108 (90 BASE) MCG/ACT inhaler Inhale 2 puffs into the lungs every 4 (four) hours as needed. 03/21/12  Yes [name], MD  ALPRAZolam (XANAX XR) 1 MG 24 hr tablet 1 tab by mouth per day as needed 02/25/12  Yes [name], MD  aspirin 81 MG EC tablet Take 81 mg by mouth daily.     Yes Historical Provider, MD  carisoprodol (SOMA) 350 MG tablet Take 1 tablet (350 mg total) by mouth 3 (three) times daily as needed for muscle spasms. 01/29/12 01/28/13 Yes [name], MD  clobetasol cream (TEMOVATE) 0.05 % APPLY TOPICALLY 2 (TWO) TIMES DAILY. TO AFFECTED AREAS ONLY BUT NOT THE FACE 12/28/11  Yes [name], MD  doxepin (SINEQUAN) 25 MG capsule Take 1 capsule (25 mg total) by mouth at bedtime. 1 by mouth every  8 hours 03/21/12  Yes [name], MD  Fluticasone-Salmeterol (ADVAIR) 250-50 MCG/DOSE AEPB Inhale 1 puff into the lungs daily. 03/21/12  Yes [name], MD  hydrocortisone 2.5 % cream Apply topically 2 (two) times daily. As needed to facial areas only 02/20/12 02/19/13 Yes [name], MD  ibuprofen (ADVIL,MOTRIN) 800 MG tablet Take 1 tablet (800 mg total) by mouth 2 (two) times daily. prn 03/21/12  Yes [name], MD  metFORMIN (GLUCOPHAGE) 500 MG tablet Take 1 tablet (500 mg total) by mouth 2 (two) times daily with a meal. 03/21/12 03/21/13 Yes [name], MD  metoprolol (LOPRESSOR) 50 MG tablet Take 1 tablet (50 mg total) by mouth 2 (two)  times daily. 03/21/12 03/21/13 Yes [name], MD  omeprazole (PRILOSEC) 20 MG capsule Take 1 capsule (20 mg total) by mouth 2 (two) times daily. 03/21/12  Yes [name], MD  sertraline (ZOLOFT) 100 MG tablet Take 1 tablet (100 mg total) by mouth 2 (two) times daily. 03/21/12  Yes [name], MD  tiotropium (SPIRIVA HANDIHALER) 18 MCG inhalation capsule Place 1 capsule (18 mcg total) into inhaler and inhale daily. 03/21/12  Yes [name], MD  traZODone (DESYREL) 100 MG tablet Take 1 tablet (100 mg total) by mouth at bedtime. 03/21/12  Yes [name], MD  warfarin (COUMADIN) 5 MG tablet Take 1 tablet (5 mg total) by mouth daily. 03/21/12 03/21/13 Yes [name], MD  zolpidem (AMBIEN) 10 MG tablet Take 1 tablet (10 mg total) by mouth at bedtime as needed for sleep. 02/18/12 05/15/12 Yes [name], MD      Allergies:  Allergies  Allergen Reactions  . Simvastatin     REACTION: myalgia  . Statins Other (See Comments)    myalgia    History   Social History  . Marital Status: Married    Spouse Name: N/A    Number of Children: 2  . Years of Education: N/A   Occupational History  . retired/disabled. prev worked in Teacher, English as a foreign language.    Social History Main Topics  . Smoking status: Former Smoker -- 2.0 packs/day for 30 years    Types: Cigarettes    Quit date: 10/16/2007  . Smokeless tobacco: Not on file  . Alcohol Use: Yes  . Drug Use: No  . Sexually Active: Not on file   Other Topics Concern  . Not on file   Social History Narrative   Lives alone.     Family History  Problem Relation Age of Onset  . COPD Mother   . Asthma Mother   . Colon polyps Mother   . Allergies Mother   . Asthma Maternal Grandmother      ROS:  Please see the history of present illness.     All other systems reviewed and negative.    Physical Exam:  Blood pressure 124/84, pulse 106, height 5\' 10"  (1.778 m), weight 340 lb 12.8 oz (154.586 kg). General: Well developed, morbidly obese Caucasian  older than his age appearing male in no acute distress. Head: Normocephalic, atraumatic, sclera non-icteric, no xanthomas, nares are without discharge. Lymph Nodes:  none  Back without kyphosis or scoliosis Neck: Negative for carotid bruits. JVD 8-9 cm Lungs: Clear bilaterally to auscultation without wheezes, rales, or rhonchi. Breathing is unlabored. Heart: Irregularly irregular and rapid rate with S1 S2. No murmurs, rubs, or gallops appreciated. Abdomen: Soft, non-tender, non-distended with normoactive bowel sounds. No hepatomegaly. No rebound/guarding. No  obvious abdominal masses. Msk:  Strength and tone appear normal for age. Extremities: No clubbing or cyanosis. 1-2+ edema edema.  Distal pedal pulses are 2+ and equal bilaterally. Skin: Warm and Dry; venous insufficiency color changes Neuro: Alert and oriented X 3. CN III-XII intact Grossly normal sensory and motor function . Psych:  Responds to questions appropriately with a normal affect.      Labs: Cardiac Enzymes No results found for this basename: CKTOTAL:4,CKMB:4,TROPONINI:4 in the last 72 hours CBC Lab Results  Component Value Date   WBC 8.4 04/08/2012   HGB 15.4 04/08/2012   HCT 46.3 04/08/2012   MCV 95.6 04/08/2012   PLT 210.0 04/08/2012   PROTIME: No results found for this basename: LABPROT:3,INR:3 in the last 72 hours Chemistry   Lab 04/08/12 1552  NA 141  K 4.3  CL 106  CO2 25  BUN 14  CREATININE 1.1  CALCIUM 8.9  PROT 6.4  BILITOT 0.3  ALKPHOS 57  ALT 33  AST 27  GLUCOSE 90   Lipids Lab Results  Component Value Date   CHOL 204* 03/21/2012   HDL 60.40 03/21/2012   LDLCALC 146* 01/04/2012   TRIG 124.0 03/21/2012   BNP Pro B Natriuretic peptide (BNP)  Date/Time Value Range Status  07/01/2007  3:57 PM 43.0  0.0-100.0 pg/mL Final   Miscellaneous No results found for this basename: DDIMER    Radiology/Studies:  No results found.  EKG: afib RVR -/12/34 RSR prime and left axis deviation  Assessment  and Plan: Sherryl Manges

## 2012-04-15 NOTE — Telephone Encounter (Signed)
Pt calling, having just left cardiologist's office.  He states he must postpone his lap band surgery, because Dr. Graciela Husbands needs to do a cardioversion for atrial fibrillation.

## 2012-04-15 NOTE — Patient Instructions (Addendum)
Your physician has requested that you have an echocardiogram prior to your cardioversion. Echocardiography is a painless test that uses sound waves to create images of your heart. It provides your doctor with information about the size and shape of your heart and how well your heart's chambers and valves are working. This procedure takes approximately one hour. There are no restrictions for this procedure.- This will be done today at 4:00 pm. You will come back to this floor to check in.  Your physician has recommended that you have a Cardioversion (DCCV). Electrical Cardioversion uses a jolt of electricity to your heart either through paddles or wired patches attached to your chest. This is a controlled, usually prescheduled, procedure. Defibrillation is done under light anesthesia in the hospital, and you usually go home the day of the procedure. This is done to get your heart back into a normal rhythm. You are not awake for the procedure. Please see the instruction sheet given to you today.  Your physician has recommended you make the following change in your medication:  1) Stop aspirin. 2) Start lasix (furosemide) 40 mg once daily. 3) Start digoxin (lanoxin) 0.25 mg once daily. 4) Do NOT take your Coumadin (warfarin) today then decrease your dose to 1 tablet (5mg ) every day except 1/2 tablet (2.5mg ) on Monday and Friday.  Set up an appt with Dr. Jonny Ruiz to have your INR checked again next week.   Please call your surgeon to postpone your surgery for now.  Our office will be in touch with you about scheduling an office visit with Dr. Graciela Husbands the week of 05/05/12.

## 2012-04-17 MED ORDER — DEXTROSE 5 % IV SOLN
2.0000 g | INTRAVENOUS | Status: DC
  Filled 2012-04-17: qty 2

## 2012-04-18 ENCOUNTER — Ambulatory Visit (HOSPITAL_COMMUNITY)
Admission: RE | Admit: 2012-04-18 | Discharge: 2012-04-18 | Disposition: A | Discharge status: Home / Self Care | Payer: Medicare HMO | Source: Ambulatory Visit | Attending: Internal Medicine | Admitting: Internal Medicine

## 2012-04-18 ENCOUNTER — Encounter (HOSPITAL_COMMUNITY)
Admission: RE | Disposition: A | Discharge status: Home / Self Care | Payer: Self-pay | Source: Ambulatory Visit | Attending: Internal Medicine

## 2012-04-18 ENCOUNTER — Encounter (HOSPITAL_COMMUNITY): Payer: Self-pay | Admitting: Certified Registered Nurse Anesthetist

## 2012-04-18 ENCOUNTER — Ambulatory Visit (HOSPITAL_COMMUNITY): Payer: Medicare HMO | Admitting: Certified Registered Nurse Anesthetist

## 2012-04-18 DIAGNOSIS — I10 Essential (primary) hypertension: Secondary | ICD-10-CM

## 2012-04-18 DIAGNOSIS — E119 Type 2 diabetes mellitus without complications: Secondary | ICD-10-CM | POA: Insufficient documentation

## 2012-04-18 DIAGNOSIS — I509 Heart failure, unspecified: Secondary | ICD-10-CM | POA: Insufficient documentation

## 2012-04-18 DIAGNOSIS — I4891 Unspecified atrial fibrillation: Secondary | ICD-10-CM

## 2012-04-18 DIAGNOSIS — I5022 Chronic systolic (congestive) heart failure: Secondary | ICD-10-CM

## 2012-04-18 DIAGNOSIS — Z7901 Long term (current) use of anticoagulants: Secondary | ICD-10-CM | POA: Insufficient documentation

## 2012-04-18 DIAGNOSIS — I429 Cardiomyopathy, unspecified: Secondary | ICD-10-CM

## 2012-04-18 HISTORY — PX: CARDIOVERSION: SHX1299

## 2012-04-18 LAB — GLUCOSE, CAPILLARY: Glucose-Capillary: 99 mg/dL (ref 70–99)

## 2012-04-18 LAB — PROTIME-INR: INR: 2.21 — ABNORMAL HIGH (ref 0.00–1.49)

## 2012-04-18 SURGERY — CARDIOVERSION
Anesthesia: General | Wound class: Clean

## 2012-04-18 MED ORDER — PROPOFOL 10 MG/ML IV BOLUS
INTRAVENOUS | Status: DC | PRN
  Administered 2012-04-18: 120 mg via INTRAVENOUS

## 2012-04-18 MED ORDER — SODIUM CHLORIDE 0.9 % IV SOLN: INTRAVENOUS | Status: DC

## 2012-04-18 MED ORDER — SODIUM CHLORIDE 0.9 % IV SOLN
INTRAVENOUS | Status: DC | PRN
  Administered 2012-04-18: 12:00:00 via INTRAVENOUS

## 2012-04-18 MED ORDER — HYDROCORTISONE 1 % EX CREA
TOPICAL_CREAM | CUTANEOUS | Status: AC
  Filled 2012-04-18: qty 28

## 2012-04-18 NOTE — Anesthesia Postprocedure Evaluation (Signed)
  Anesthesia Post-op Note  Patient: [name]  Procedure(s) Performed: Procedure(s) (LRB): CARDIOVERSION (N/A)  Patient Location: Short Stay  Anesthesia Type: MAC  Level of Consciousness: awake, alert  and oriented  Airway and Oxygen Therapy: Patient Spontanous Breathing and Patient connected to nasal cannula oxygen  Post-op Pain: none  Post-op Assessment: Post-op Vital signs reviewed, Patient's Cardiovascular Status Stable, Respiratory Function Stable, Patent Airway and No signs of Nausea or vomiting  Post-op Vital Signs: Reviewed and stable  Complications: No apparent anesthesia complications

## 2012-04-18 NOTE — H&P (View-Only) (Signed)
 CARDIOLOGY CONSULT NOTE  Patient ID: [name], MRN: [id number], DOB/AGE: 04/11/1956 56 y.o. Admit date: (Not on file) Date of Consult: 04/15/2012  Primary Physician: James John, MD Primary Cardiologist: new  Chief Complaint: afib   HPI [name] is a 56 y.o. male : Seen at the request of Dr. John because of newly identified(newly reidentified) atrial fibrillation. It was first noted in 2001. He had no recurrent problems until it was noted on physical examination last month. The patient notes significant worsening of his exercise tolerance over the last 3-4 months characterized by fatigue, dyspnea, and lightheadedness. He has had more edema. He's had no palpitations.  Myoview undertaken a year ago demonstrated a small inferior wall infarct involving also the apex with depressed left ventricular function and an ejection fraction of 44%. There was no ischemia.  Thromboembolic risk factors are notable for hypertension-1, diabetes-1, congestive failure-#1, possible vascular disease-1 for a CHADS-VASc score of 3-4. Warfarin and Lopressor were initiated at his last visit with Dr. John. He has noted no significant improvement.  He has a history of remote syncope. This occurred with his atrial fibrillation.  He has sleep apnea for which therapy was initiated 3 months ago.  He is currently scheduled to undergo lap band surgery later this month.  Past Medical History  Diagnosis Date  . ALLERGIC RHINITIS   . ANXIETY   . ASTHMA   . Chronic pain syndrome   . BENIGN PROSTATIC HYPERTROPHY   . DEPRESSION   . DIABETES MELLITUS, TYPE II   . DYSKINESIA, ESOPHAGUS   . FIBROMYALGIA   . GERD   . HYPERLIPIDEMIA   . HYPERTENSION   . INSOMNIA   . LOW BACK PAIN   . Morbid obesity     Anticipated LAP-BAND surgery  . NEPHROLITHIASIS, HX OF   . OBSESSIVE-COMPULSIVE DISORDER   . ORCHITIS   . PERIPHERAL EDEMA   . PLANTAR FASCIITIS, BILATERAL   . POLYARTHRALGIA   . Rash and other nonspecific  skin eruption 04/12/2009  . SLEEP APNEA, OBSTRUCTIVE   . Past myocardial infarction 04/19/2011  . Atrial fibrillation     Initially identified 2001, recurring 2013  . Cardiomyopathy     Myoview with prior infarct- 2012; repeat pending  . Syncope     Associated with atrial fibrillation 2001      Surgical History:  Past Surgical History  Procedure Date  . Tonsillectomy   . Acne cyst removal     2 on back      Home Meds: Prior to Admission medications   Medication Sig Start Date End Date Taking? Authorizing Provider  albuterol (PROVENTIL HFA;VENTOLIN HFA) 108 (90 BASE) MCG/ACT inhaler Inhale 2 puffs into the lungs every 4 (four) hours as needed. 03/21/12  Yes James W John, MD  ALPRAZolam (XANAX XR) 1 MG 24 hr tablet 1 tab by mouth per day as needed 02/25/12  Yes James W John, MD  aspirin 81 MG EC tablet Take 81 mg by mouth daily.     Yes Historical Provider, MD  carisoprodol (SOMA) 350 MG tablet Take 1 tablet (350 mg total) by mouth 3 (three) times daily as needed for muscle spasms. 01/29/12 01/28/13 Yes James W John, MD  clobetasol cream (TEMOVATE) 0.05 % APPLY TOPICALLY 2 (TWO) TIMES DAILY. TO AFFECTED AREAS ONLY BUT NOT THE FACE 12/28/11  Yes James W John, MD  doxepin (SINEQUAN) 25 MG capsule Take 1 capsule (25 mg total) by mouth at bedtime. 1 by mouth every   8 hours 03/21/12  Yes James W John, MD  Fluticasone-Salmeterol (ADVAIR) 250-50 MCG/DOSE AEPB Inhale 1 puff into the lungs daily. 03/21/12  Yes James W John, MD  hydrocortisone 2.5 % cream Apply topically 2 (two) times daily. As needed to facial areas only 02/20/12 02/19/13 Yes James W John, MD  ibuprofen (ADVIL,MOTRIN) 800 MG tablet Take 1 tablet (800 mg total) by mouth 2 (two) times daily. prn 03/21/12  Yes James W John, MD  metFORMIN (GLUCOPHAGE) 500 MG tablet Take 1 tablet (500 mg total) by mouth 2 (two) times daily with a meal. 03/21/12 03/21/13 Yes James W John, MD  metoprolol (LOPRESSOR) 50 MG tablet Take 1 tablet (50 mg total) by mouth 2 (two)  times daily. 03/21/12 03/21/13 Yes James W John, MD  omeprazole (PRILOSEC) 20 MG capsule Take 1 capsule (20 mg total) by mouth 2 (two) times daily. 03/21/12  Yes James W John, MD  sertraline (ZOLOFT) 100 MG tablet Take 1 tablet (100 mg total) by mouth 2 (two) times daily. 03/21/12  Yes James W John, MD  tiotropium (SPIRIVA HANDIHALER) 18 MCG inhalation capsule Place 1 capsule (18 mcg total) into inhaler and inhale daily. 03/21/12  Yes James W John, MD  traZODone (DESYREL) 100 MG tablet Take 1 tablet (100 mg total) by mouth at bedtime. 03/21/12  Yes James W John, MD  warfarin (COUMADIN) 5 MG tablet Take 1 tablet (5 mg total) by mouth daily. 03/21/12 03/21/13 Yes James W John, MD  zolpidem (AMBIEN) 10 MG tablet Take 1 tablet (10 mg total) by mouth at bedtime as needed for sleep. 02/18/12 05/15/12 Yes James W John, MD      Allergies:  Allergies  Allergen Reactions  . Simvastatin     REACTION: myalgia  . Statins Other (See Comments)    myalgia    History   Social History  . Marital Status: Married    Spouse Name: N/A    Number of Children: 2  . Years of Education: N/A   Occupational History  . retired/disabled. prev worked in painting business.    Social History Main Topics  . Smoking status: Former Smoker -- 2.0 packs/day for 30 years    Types: Cigarettes    Quit date: 10/16/2007  . Smokeless tobacco: Not on file  . Alcohol Use: Yes  . Drug Use: No  . Sexually Active: Not on file   Other Topics Concern  . Not on file   Social History Narrative   Lives alone.     Family History  Problem Relation Age of Onset  . COPD Mother   . Asthma Mother   . Colon polyps Mother   . Allergies Mother   . Asthma Maternal Grandmother      ROS:  Please see the history of present illness.     All other systems reviewed and negative.    Physical Exam:  Blood pressure 124/84, pulse 106, height 5' 10" (1.778 m), weight 340 lb 12.8 oz (154.586 kg). General: Well developed, morbidly obese Caucasian  older than his age appearing male in no acute distress. Head: Normocephalic, atraumatic, sclera non-icteric, no xanthomas, nares are without discharge. Lymph Nodes:  none  Back without kyphosis or scoliosis Neck: Negative for carotid bruits. JVD 8-9 cm Lungs: Clear bilaterally to auscultation without wheezes, rales, or rhonchi. Breathing is unlabored. Heart: Irregularly irregular and rapid rate with S1 S2. No murmurs, rubs, or gallops appreciated. Abdomen: Soft, non-tender, non-distended with normoactive bowel sounds. No hepatomegaly. No rebound/guarding. No   obvious abdominal masses. Msk:  Strength and tone appear normal for age. Extremities: No clubbing or cyanosis. 1-2+ edema edema.  Distal pedal pulses are 2+ and equal bilaterally. Skin: Warm and Dry; venous insufficiency color changes Neuro: Alert and oriented X 3. CN III-XII intact Grossly normal sensory and motor function . Psych:  Responds to questions appropriately with a normal affect.      Labs: Cardiac Enzymes No results found for this basename: CKTOTAL:4,CKMB:4,TROPONINI:4 in the last 72 hours CBC Lab Results  Component Value Date   WBC 8.4 04/08/2012   HGB 15.4 04/08/2012   HCT 46.3 04/08/2012   MCV 95.6 04/08/2012   PLT 210.0 04/08/2012   PROTIME: No results found for this basename: LABPROT:3,INR:3 in the last 72 hours Chemistry   Lab 04/08/12 1552  NA 141  K 4.3  CL 106  CO2 25  BUN 14  CREATININE 1.1  CALCIUM 8.9  PROT 6.4  BILITOT 0.3  ALKPHOS 57  ALT 33  AST 27  GLUCOSE 90   Lipids Lab Results  Component Value Date   CHOL 204* 03/21/2012   HDL 60.40 03/21/2012   LDLCALC 146* 01/04/2012   TRIG 124.0 03/21/2012   BNP Pro B Natriuretic peptide (BNP)  Date/Time Value Range Status  07/01/2007  3:57 PM 43.0  0.0-100.0 pg/mL Final   Miscellaneous No results found for this basename: DDIMER    Radiology/Studies:  No results found.  EKG: afib RVR -/12/34 RSR prime and left axis deviation  Assessment  and Plan: [name]  

## 2012-04-18 NOTE — Interval H&P Note (Signed)
History and Physical Interval Note:  04/18/2012 11:29 AM  [name]  has presented today for surgery, with the diagnosis of a fib  The various methods of treatment have been discussed with the patient and family. After consideration of risks, benefits and other options for treatment, the patient has consented to  Procedure(s) (LRB): CARDIOVERSION (N/A) as a surgical intervention .  The patient's history has been reviewed, patient examined, no change in status, stable for surgery.  I have reviewed the patients' chart and labs.  Questions were answered to the patient's satisfaction.     Dietrich Pates

## 2012-04-18 NOTE — Transfer of Care (Signed)
Immediate Anesthesia Transfer of Care Note  Patient: [name]  Procedure(s) Performed: Procedure(s) (LRB): CARDIOVERSION (N/A)  Patient Location: Short Stay  Anesthesia Type: MAC  Level of Consciousness: awake, alert  and oriented  Airway & Oxygen Therapy: Patient Spontanous Breathing  Post-op Assessment: Report given to PACU RN and Post -op Vital signs reviewed and stable  Post vital signs: Reviewed and stable  Complications: No apparent anesthesia complications

## 2012-04-18 NOTE — Anesthesia Preprocedure Evaluation (Addendum)
Anesthesia Evaluation  Patient identified by MRN, date of birth, ID band Patient awake    Reviewed: Allergy & Precautions, H&P , NPO status , Patient's Chart, lab work & pertinent test results, reviewed documented beta blocker date and time   Airway Mallampati: III TM Distance: >3 FB Neck ROM: Full    Dental  (+) Teeth Intact and Dental Advisory Given   Pulmonary asthma , sleep apnea and Continuous Positive Airway Pressure Ventilation ,    Pulmonary exam normal       Cardiovascular hypertension, Pt. on medications and Pt. on home beta blockers + dysrhythmias Atrial Fibrillation Rhythm:Irregular  EF=45%   Neuro/Psych Anxiety Depression  Neuromuscular disease    GI/Hepatic Neg liver ROS, GERD-  Medicated and Controlled,  Endo/Other  Diabetes mellitus-, Well Controlled, Type 2, Oral Hypoglycemic AgentsMorbid obesity  Renal/GU negative Renal ROS     Musculoskeletal   Abdominal   Peds  Hematology negative hematology ROS (+)   Anesthesia Other Findings   Reproductive/Obstetrics                         Anesthesia Physical Anesthesia Plan  ASA: III  Anesthesia Plan: General   Post-op Pain Management:    Induction: Intravenous  Airway Management Planned: Mask  Additional Equipment:   Intra-op Plan:   Post-operative Plan: Extubation in OR  Informed Consent:   Plan Discussed with:   Anesthesia Plan Comments:         Anesthesia Quick Evaluation

## 2012-04-18 NOTE — Op Note (Signed)
Patient sedated with propofol.  With pads in AP position attempt at cardioversion made with 200J synchronized biphasic energy.  Unsuccessful. Pads switched to apex/base.  Attempt again with 200 J synchronized biphasic energy.  Unsuccessful. Because of concern regarding inadquate pad contact, pads secured and procedure repeated.  Successful conversion to SR.  12 lead EKG done. Procedure without complication.  Discussed with S Putt. INR 2.2 today.  With rapid change in INR, rec for coumadin  1 tab (5 mg) today.  Will take 5 mg everyday except Monday he should take 2.5.  Will need to schedule f/u with Dr. Raphael Gibney office.  Discussed with daughter.

## 2012-04-18 NOTE — Preoperative (Signed)
Beta Blockers   Reason not to administer Beta Blockers:Not Applicable, took metoprolol this am 

## 2012-04-21 ENCOUNTER — Encounter (HOSPITAL_COMMUNITY): Payer: Self-pay | Admitting: Internal Medicine

## 2012-04-22 ENCOUNTER — Telehealth: Payer: Self-pay

## 2012-04-22 ENCOUNTER — Ambulatory Visit (INDEPENDENT_AMBULATORY_CARE_PROVIDER_SITE_OTHER): Payer: Medicare HMO

## 2012-04-22 ENCOUNTER — Ambulatory Visit (INDEPENDENT_AMBULATORY_CARE_PROVIDER_SITE_OTHER): Payer: Medicare HMO | Admitting: Internal Medicine

## 2012-04-22 VITALS — BP 110/82 | HR 90 | Wt 341.0 lb

## 2012-04-22 DIAGNOSIS — I4891 Unspecified atrial fibrillation: Secondary | ICD-10-CM

## 2012-04-22 DIAGNOSIS — I5032 Chronic diastolic (congestive) heart failure: Secondary | ICD-10-CM

## 2012-04-22 DIAGNOSIS — I252 Old myocardial infarction: Secondary | ICD-10-CM

## 2012-04-22 NOTE — Assessment & Plan Note (Signed)
The patient has atrial fibrillation. He has normal left ventricular function but abnormal Myoview suggestive of a prior infarct precluding the use of a 1C agent  his QTC is borderline but I suspect we will be okay using dofetilide 2 facilitate the sustaining a sinus rhythm and hopefully reverse his diastolic heart failure and prepare him or his upcoming lap band surgery. We have discussed potential pro-rhythmic risk and the need for in-hospital monitoring we will check his magnesium level today; his potassium level was fine last week

## 2012-04-22 NOTE — Telephone Encounter (Signed)
Pt is calling to report that he feels weak, confused and feels like something is " just not right in my chest".  He has a dull ache in his chest coming through his back.  No energy and a hard time catching his breath.  Symptoms since Sat.  He states he feels about the same as he did the first time he came in to this clinc.

## 2012-04-22 NOTE — Assessment & Plan Note (Signed)
Noted on Myoview

## 2012-04-22 NOTE — Progress Notes (Signed)
HPI  [name] is a 56 y.o. male Seen a couple of weeks ago and found to be in atrial fibrillation. This was manifested as congestive heart failure characterized by some edema but worsening exercise intolerance dyspnea and lightheadedness. There were no associated palpitations. He underwent cardioversion last week felt moderately better and then reverted to atrial fibrillation over the weekend. He comes in today feeling crummy.  Echo last week demonstrated normal left ventricular function moderate left atrial enlargement at 51 mm laboratories had demonstrated normal potassium magnesium was not drawn  Past Medical History  Diagnosis Date  . ALLERGIC RHINITIS   . ANXIETY   . ASTHMA   . Chronic pain syndrome   . BENIGN PROSTATIC HYPERTROPHY   . DEPRESSION   . DIABETES MELLITUS, TYPE II   . DYSKINESIA, ESOPHAGUS   . FIBROMYALGIA   . GERD   . HYPERLIPIDEMIA   . HYPERTENSION   . INSOMNIA   . LOW BACK PAIN   . Morbid obesity     Anticipated LAP-BAND surgery  . NEPHROLITHIASIS, HX OF   . OBSESSIVE-COMPULSIVE DISORDER   . ORCHITIS   . PERIPHERAL EDEMA   . PLANTAR FASCIITIS, BILATERAL   . POLYARTHRALGIA   . Rash and other nonspecific skin eruption 04/12/2009  . SLEEP APNEA, OBSTRUCTIVE   . Past myocardial infarction 04/19/2011  . Atrial fibrillation     Initially identified 2001, recurring 2013  . Cardiomyopathy     Myoview with prior infarct- 2012; repeat pending  . Syncope     Associated with atrial fibrillation 2001    Past Surgical History  Procedure Date  . Tonsillectomy   . Acne cyst removal     2 on back   . Cardioversion 04/18/2012    Procedure: CARDIOVERSION;  Surgeon: Pricilla Riffle, MD;  Location: [facility] OR;  Service: Cardiovascular;  Laterality: N/A;    Current Outpatient Prescriptions  Medication Sig Dispense Refill  . albuterol (PROVENTIL HFA;VENTOLIN HFA) 108 (90 BASE) MCG/ACT inhaler Inhale 2 puffs into the lungs every 4 (four) hours as needed. For wheezing        . ALPRAZolam (XANAX XR) 1 MG 24 hr tablet Take 1 mg by mouth once as needed. For anxiety      . carisoprodol (SOMA) 350 MG tablet Take 350 mg by mouth 3 (three) times daily as needed. For muscle spasms      . clobetasol cream (TEMOVATE) 0.05 %       . digoxin (LANOXIN) 0.25 MG tablet Take 0.25 mg by mouth daily.      [name] doxepin (SINEQUAN) 25 MG capsule Take 25 mg by mouth 2 (two) times daily.      . Fluticasone-Salmeterol (ADVAIR) 250-50 MCG/DOSE AEPB Inhale 1 puff into the lungs daily.      . furosemide (LASIX) 40 MG tablet Take 40 mg by mouth daily.      . hydrocortisone 2.5 % cream Apply 1 application topically 2 (two) times daily. As needed to facial areas only      . ibuprofen (ADVIL,MOTRIN) 800 MG tablet Take 800 mg by mouth 2 (two) times daily as needed. For pain      . metFORMIN (GLUCOPHAGE) 500 MG tablet Take 500 mg by mouth 2 (two) times daily with a meal.      . metoprolol (LOPRESSOR) 50 MG tablet Take 50 mg by mouth 2 (two) times daily.      [name] omeprazole (PRILOSEC) 20 MG capsule Take 20 mg by mouth 2 (two)  times daily.      . sertraline (ZOLOFT) 100 MG tablet Take 100 mg by mouth 2 (two) times daily.      [name] SPIRIVA HANDIHALER 18 MCG inhalation capsule       . traZODone (DESYREL) 100 MG tablet Take 50 mg by mouth at bedtime.      [name] warfarin (COUMADIN) 5 MG tablet Take 2.5-5 mg by mouth daily. Take 2.5mg  on Mon & Fri & 5mg  on all other days      . zolpidem (AMBIEN) 10 MG tablet Take 10 mg by mouth at bedtime as needed. For sleep      . DISCONTD: atorvastatin (LIPITOR) 20 MG tablet Take 1 tablet (20 mg total) by mouth daily.  30 tablet  11  . DISCONTD: gabapentin (NEURONTIN) 300 MG capsule Take 1 capsule (300 mg total) by mouth 3 (three) times daily.  90 capsule  5    Allergies  Allergen Reactions  . Simvastatin     REACTION: myalgia  . Statins Other (See Comments)    myalgia    Review of Systems negative except from HPI and PMH  Physical Exam BP 110/82  Pulse 90  Wt 341  lb (154.677 kg) Well developed and morbidly obese Caucasian male appearing tired and sluggish  HENT normal E scleral and icterus clear Neck Supple JVP flat; carotids brisk and full Clear to ausculation irregularly irregular no murmurs gallops or rub Soft with active bowel sounds No clubbing cyanosis 1+ Edema Alert and oriented, grossly normal motor and sensory function Skin Warm and Dry  ECG demonstrates atrial fibrillation at 89 Intervals-/12/37 with a QTC of 45  Assessment and  Plan

## 2012-04-22 NOTE — Patient Instructions (Signed)
Your physician recommends that you have lab work today: bmp/magnesium  You will be set up for a hospital admission for Tikosyn initiation. Herbert Seta, RN for Dr. Graciela Husbands will be in touch with you about this tomorrow.

## 2012-04-22 NOTE — Assessment & Plan Note (Signed)
This is aggravated by the reversion to atrial fibrillation. We will plan to admit him at that point we will aggressively diuresis and in anticipation of cardioversion. We'll also need to consider sleep apnea

## 2012-04-22 NOTE — Telephone Encounter (Signed)
Pt has seen Dr Graciela Husbands just today July 9;  I would respectfully ask to defer message to Dr Graciela Husbands  I would also ask pt to have PT/INR done at [address] since we do not have a coumadin clinic here at [facility]

## 2012-04-22 NOTE — Progress Notes (Signed)
Pt presented for an ekg and states he feels weak, confused and feels like something is " just not right in my chest". He has a dull ache in his chest coming through his back. No energy and a hard time catching his breath. Symptoms since Sat. He states he feels about the same as he did the first time he came in to this clinc.  EKG done and given to Dr Graciela Husbands.

## 2012-04-22 NOTE — Patient Instructions (Signed)
Appt with Dr Graciela Husbands today.

## 2012-04-22 NOTE — Telephone Encounter (Signed)
Patient would like to have PT/INR done at our office, please advise.  Patient states he has no energy and SOB since procedure for heart on Friday.

## 2012-04-23 ENCOUNTER — Telehealth: Payer: Self-pay | Admitting: Internal Medicine

## 2012-04-23 LAB — BASIC METABOLIC PANEL
CO2: 30 mEq/L (ref 19–32)
Chloride: 100 mEq/L (ref 96–112)
Creatinine, Ser: 0.8 mg/dL (ref 0.4–1.5)
Potassium: 3.8 mEq/L (ref 3.5–5.1)
Sodium: 138 mEq/L (ref 135–145)

## 2012-04-23 LAB — MAGNESIUM: Magnesium: 1.9 mg/dL (ref 1.5–2.5)

## 2012-04-23 MED ORDER — POTASSIUM CHLORIDE CRYS ER 20 MEQ PO TBCR: 40.0000 meq | EXTENDED_RELEASE_TABLET | ORAL | Status: DC

## 2012-04-23 NOTE — Telephone Encounter (Signed)
Called the patient informed of PCP instructions.  Patient agreed to all and had gone in yesterday 7/913 and seen Dr. Graciela Husbands.

## 2012-04-23 NOTE — Telephone Encounter (Signed)
PT'S DTR CALLING RE PT BEING ADMITTED TODAY TO [facility] AND HAS NOT HEARD FROM ANYONE AS TO WHEN PT SHOULD BE THERE, PLS CALL [phone]

## 2012-04-23 NOTE — Telephone Encounter (Signed)
Called left message to call back 

## 2012-04-23 NOTE — Telephone Encounter (Signed)
Spoke with pt's daughter.  K was too low to start Tikosyn today.  I will not be available to do Tikosyn admission until next Wednesday.  Pt and daughter do not want to wait that long.  Discussed with Dr. Graciela Husbands.  Will supplement potassium today and tomorrow ( x 2 doses) and admit to hospital tomorrow.  Called admitting and pt is set up with a bed.  Made Trish with the hospital team aware that pt will not have any pre-labs done.  Pt's daughter aware of the plan and will pick up potassium prescription this afternoon.

## 2012-04-24 ENCOUNTER — Other Ambulatory Visit: Payer: Self-pay

## 2012-04-24 ENCOUNTER — Encounter (HOSPITAL_COMMUNITY): Payer: Self-pay | Admitting: Cardiology

## 2012-04-24 ENCOUNTER — Inpatient Hospital Stay (HOSPITAL_COMMUNITY)
Admission: AD | Admit: 2012-04-24 | Discharge: 2012-04-28 | DRG: 309 | Disposition: A | Discharge status: Home / Self Care | Payer: Medicare HMO | Source: Ambulatory Visit | Attending: Internal Medicine | Admitting: Internal Medicine

## 2012-04-24 DIAGNOSIS — I5032 Chronic diastolic (congestive) heart failure: Secondary | ICD-10-CM | POA: Diagnosis present

## 2012-04-24 DIAGNOSIS — N400 Enlarged prostate without lower urinary tract symptoms: Secondary | ICD-10-CM | POA: Diagnosis present

## 2012-04-24 DIAGNOSIS — IMO0001 Reserved for inherently not codable concepts without codable children: Secondary | ICD-10-CM | POA: Diagnosis present

## 2012-04-24 DIAGNOSIS — M545 Low back pain, unspecified: Secondary | ICD-10-CM | POA: Diagnosis present

## 2012-04-24 DIAGNOSIS — Z6841 Body Mass Index (BMI) 40.0 and over, adult: Secondary | ICD-10-CM

## 2012-04-24 DIAGNOSIS — G4733 Obstructive sleep apnea (adult) (pediatric): Secondary | ICD-10-CM | POA: Diagnosis present

## 2012-04-24 DIAGNOSIS — G894 Chronic pain syndrome: Secondary | ICD-10-CM | POA: Diagnosis present

## 2012-04-24 DIAGNOSIS — I219 Acute myocardial infarction, unspecified: Secondary | ICD-10-CM | POA: Diagnosis present

## 2012-04-24 DIAGNOSIS — E119 Type 2 diabetes mellitus without complications: Secondary | ICD-10-CM | POA: Diagnosis present

## 2012-04-24 DIAGNOSIS — I4891 Unspecified atrial fibrillation: Principal | ICD-10-CM

## 2012-04-24 DIAGNOSIS — F329 Major depressive disorder, single episode, unspecified: Secondary | ICD-10-CM | POA: Diagnosis present

## 2012-04-24 DIAGNOSIS — K219 Gastro-esophageal reflux disease without esophagitis: Secondary | ICD-10-CM | POA: Diagnosis present

## 2012-04-24 DIAGNOSIS — J45909 Unspecified asthma, uncomplicated: Secondary | ICD-10-CM | POA: Diagnosis present

## 2012-04-24 DIAGNOSIS — I428 Other cardiomyopathies: Secondary | ICD-10-CM | POA: Diagnosis present

## 2012-04-24 DIAGNOSIS — F411 Generalized anxiety disorder: Secondary | ICD-10-CM | POA: Diagnosis present

## 2012-04-24 DIAGNOSIS — F3289 Other specified depressive episodes: Secondary | ICD-10-CM | POA: Diagnosis present

## 2012-04-24 DIAGNOSIS — I509 Heart failure, unspecified: Secondary | ICD-10-CM | POA: Diagnosis present

## 2012-04-24 DIAGNOSIS — F429 Obsessive-compulsive disorder, unspecified: Secondary | ICD-10-CM | POA: Diagnosis present

## 2012-04-24 DIAGNOSIS — I739 Peripheral vascular disease, unspecified: Secondary | ICD-10-CM | POA: Diagnosis present

## 2012-04-24 DIAGNOSIS — I10 Essential (primary) hypertension: Secondary | ICD-10-CM | POA: Diagnosis present

## 2012-04-24 DIAGNOSIS — E785 Hyperlipidemia, unspecified: Secondary | ICD-10-CM | POA: Diagnosis present

## 2012-04-24 DIAGNOSIS — K5900 Constipation, unspecified: Secondary | ICD-10-CM | POA: Diagnosis not present

## 2012-04-24 DIAGNOSIS — G4700 Insomnia, unspecified: Secondary | ICD-10-CM | POA: Diagnosis present

## 2012-04-24 HISTORY — DX: Acute myocardial infarction, unspecified: I21.9

## 2012-04-24 LAB — BASIC METABOLIC PANEL
BUN: 13 mg/dL (ref 6–23)
CO2: 28 mEq/L (ref 19–32)
CO2: 27 mEq/L (ref 19–32)
Calcium: 9.4 mg/dL (ref 8.4–10.5)
Chloride: 103 mEq/L (ref 96–112)
Chloride: 102 mEq/L (ref 96–112)
Creatinine, Ser: 0.78 mg/dL (ref 0.50–1.35)
GFR calc Af Amer: >90 mL/min (ref >90)
Glucose, Bld: 88 mg/dL (ref 70–99)
Glucose, Bld: 95 mg/dL (ref 70–99)
Potassium: 4.8 mEq/L (ref 3.5–5.1)
Sodium: 140 mEq/L (ref 135–145)

## 2012-04-24 LAB — CBC
HCT: 43 % (ref 39.0–52.0)
Hemoglobin: 14.7 g/dL (ref 13.0–17.0)
MCH: 32 pg (ref 26.0–34.0)
MCHC: 34.2 g/dL (ref 30.0–36.0)
MCV: 93.7 fL (ref 78.0–100.0)
RDW: 12.7 % (ref 11.5–15.5)

## 2012-04-24 LAB — PROTIME-INR
INR: 1.41 (ref 0.00–1.49)
INR: 1.44 (ref 0.00–1.49)
Prothrombin Time: 17.5 seconds — ABNORMAL HIGH (ref 11.6–15.2)

## 2012-04-24 LAB — MAGNESIUM: Magnesium: 1.9 mg/dL (ref 1.5–2.5)

## 2012-04-24 MED ORDER — METFORMIN HCL 500 MG PO TABS
500.0000 mg | ORAL_TABLET | Freq: Two times a day (BID) | ORAL | Status: DC
  Administered 2012-04-24 – 2012-04-28 (×6): 500 mg via ORAL
  Filled 2012-04-24 (×10): qty 1

## 2012-04-24 MED ORDER — SODIUM CHLORIDE 0.9 % IV SOLN: 250.0000 mL | INTRAVENOUS | Status: DC | PRN

## 2012-04-24 MED ORDER — SODIUM CHLORIDE 0.45 % IV SOLN
INTRAVENOUS | Status: DC
  Administered 2012-04-25: 08:00:00 via INTRAVENOUS

## 2012-04-24 MED ORDER — SERTRALINE HCL 100 MG PO TABS
200.0000 mg | ORAL_TABLET | Freq: Every day | ORAL | Status: DC
  Administered 2012-04-24 – 2012-04-28 (×5): 200 mg via ORAL
  Filled 2012-04-24 (×5): qty 2

## 2012-04-24 MED ORDER — HYDROCORTISONE 1 % EX CREA
1.0000 "application " | TOPICAL_CREAM | Freq: Three times a day (TID) | CUTANEOUS | Status: DC | PRN
  Filled 2012-04-24: qty 28

## 2012-04-24 MED ORDER — FENTANYL CITRATE 0.05 MG/ML IJ SOLN: 250.0000 ug | INTRAMUSCULAR | Status: DC

## 2012-04-24 MED ORDER — DOXEPIN HCL 25 MG PO CAPS
25.0000 mg | ORAL_CAPSULE | Freq: Two times a day (BID) | ORAL | Status: DC
  Administered 2012-04-24 – 2012-04-28 (×8): 25 mg via ORAL
  Filled 2012-04-24 (×10): qty 1

## 2012-04-24 MED ORDER — FLUTICASONE-SALMETEROL 250-50 MCG/DOSE IN AEPB
1.0000 | INHALATION_SPRAY | Freq: Every day | RESPIRATORY_TRACT | Status: DC
  Administered 2012-04-26 – 2012-04-28 (×3): 1 via RESPIRATORY_TRACT
  Filled 2012-04-24 (×2): qty 14

## 2012-04-24 MED ORDER — ZOLPIDEM TARTRATE 5 MG PO TABS
10.0000 mg | ORAL_TABLET | Freq: Every evening | ORAL | Status: DC | PRN
  Administered 2012-04-24 – 2012-04-27 (×4): 10 mg via ORAL
  Filled 2012-04-24 (×4): qty 2

## 2012-04-24 MED ORDER — PANTOPRAZOLE SODIUM 40 MG PO TBEC
40.0000 mg | DELAYED_RELEASE_TABLET | Freq: Every day | ORAL | Status: DC
  Administered 2012-04-24 – 2012-04-27 (×3): 40 mg via ORAL
  Filled 2012-04-24 (×3): qty 1

## 2012-04-24 MED ORDER — ALPRAZOLAM ER 1 MG PO TB24: 1.0000 mg | ORAL_TABLET | Freq: Every evening | ORAL | Status: DC | PRN

## 2012-04-24 MED ORDER — METOPROLOL TARTRATE 50 MG PO TABS
50.0000 mg | ORAL_TABLET | Freq: Two times a day (BID) | ORAL | Status: DC
  Administered 2012-04-24 – 2012-04-28 (×6): 50 mg via ORAL
  Filled 2012-04-24 (×9): qty 1

## 2012-04-24 MED ORDER — ALBUTEROL SULFATE HFA 108 (90 BASE) MCG/ACT IN AERS
2.0000 | INHALATION_SPRAY | RESPIRATORY_TRACT | Status: DC | PRN
  Filled 2012-04-24: qty 6.7

## 2012-04-24 MED ORDER — IBUPROFEN 800 MG PO TABS
800.0000 mg | ORAL_TABLET | Freq: Two times a day (BID) | ORAL | Status: DC | PRN
  Administered 2012-04-25 – 2012-04-27 (×3): 800 mg via ORAL
  Filled 2012-04-24 (×3): qty 1

## 2012-04-24 MED ORDER — SODIUM CHLORIDE 0.9 % IJ SOLN: 3.0000 mL | INTRAMUSCULAR | Status: DC | PRN

## 2012-04-24 MED ORDER — WARFARIN SODIUM 2.5 MG PO TABS: 2.5000 mg | ORAL_TABLET | ORAL | Status: DC

## 2012-04-24 MED ORDER — DIGOXIN 250 MCG PO TABS
0.2500 mg | ORAL_TABLET | Freq: Every day | ORAL | Status: DC
  Administered 2012-04-24: 0.25 mg via ORAL
  Filled 2012-04-24: qty 1

## 2012-04-24 MED ORDER — HEPARIN BOLUS VIA INFUSION
2500.0000 [IU] | Freq: Once | INTRAVENOUS | Status: AC
  Administered 2012-04-24: 2500 [IU] via INTRAVENOUS
  Filled 2012-04-24: qty 2500

## 2012-04-24 MED ORDER — CARISOPRODOL 350 MG PO TABS
350.0000 mg | ORAL_TABLET | Freq: Every day | ORAL | Status: DC
  Administered 2012-04-24: 350 mg via ORAL
  Filled 2012-04-24: qty 1

## 2012-04-24 MED ORDER — BENZOCAINE 20 % MT SOLN
1.0000 "application " | OROMUCOSAL | Status: DC | PRN
  Filled 2012-04-24: qty 57

## 2012-04-24 MED ORDER — SODIUM CHLORIDE 0.9 % IJ SOLN: 3.0000 mL | Freq: Two times a day (BID) | INTRAMUSCULAR | Status: DC

## 2012-04-24 MED ORDER — WARFARIN SODIUM 2.5 MG PO TABS: 2.5000 mg | ORAL_TABLET | Freq: Every day | ORAL | Status: DC

## 2012-04-24 MED ORDER — MIDAZOLAM HCL 10 MG/2ML IJ SOLN: 10.0000 mg | INTRAMUSCULAR | Status: DC

## 2012-04-24 MED ORDER — SODIUM CHLORIDE 0.9 % IV SOLN
250.0000 mL | INTRAVENOUS | Status: DC
  Administered 2012-04-24: 250 mL via INTRAVENOUS

## 2012-04-24 MED ORDER — WARFARIN SODIUM 2.5 MG PO TABS
2.5000 mg | ORAL_TABLET | Freq: Once | ORAL | Status: AC
  Administered 2012-04-24: 2.5 mg via ORAL
  Filled 2012-04-24: qty 1

## 2012-04-24 MED ORDER — WARFARIN - PHYSICIAN DOSING INPATIENT: Freq: Every day | Status: DC

## 2012-04-24 MED ORDER — POTASSIUM CHLORIDE CRYS ER 20 MEQ PO TBCR: 40.0000 meq | EXTENDED_RELEASE_TABLET | ORAL | Status: DC

## 2012-04-24 MED ORDER — TRAZODONE HCL 50 MG PO TABS
50.0000 mg | ORAL_TABLET | Freq: Every day | ORAL | Status: DC
  Administered 2012-04-24 – 2012-04-27 (×4): 50 mg via ORAL
  Filled 2012-04-24 (×5): qty 1

## 2012-04-24 MED ORDER — FUROSEMIDE 40 MG PO TABS
40.0000 mg | ORAL_TABLET | Freq: Every day | ORAL | Status: DC
  Administered 2012-04-24: 40 mg via ORAL
  Filled 2012-04-24: qty 1

## 2012-04-24 MED ORDER — WARFARIN - PHARMACIST DOSING INPATIENT: Freq: Every day | Status: DC

## 2012-04-24 MED ORDER — SODIUM CHLORIDE 0.9 % IV SOLN: INTRAVENOUS | Status: AC

## 2012-04-24 MED ORDER — HEPARIN (PORCINE) IN NACL 100-0.45 UNIT/ML-% IJ SOLN
1800.0000 [IU]/h | INTRAMUSCULAR | Status: DC
  Administered 2012-04-24: 1300 [IU]/h via INTRAVENOUS
  Administered 2012-04-25: 1800 [IU]/h via INTRAVENOUS
  Administered 2012-04-25: 1500 [IU]/h via INTRAVENOUS
  Administered 2012-04-27: 1800 [IU]/h via INTRAVENOUS
  Filled 2012-04-24 (×6): qty 250

## 2012-04-24 MED ORDER — FUROSEMIDE 10 MG/ML IJ SOLN
40.0000 mg | Freq: Two times a day (BID) | INTRAMUSCULAR | Status: DC
  Administered 2012-04-24: 40 mg via INTRAVENOUS
  Filled 2012-04-24 (×4): qty 4

## 2012-04-24 MED ORDER — WARFARIN SODIUM 5 MG PO TABS
5.0000 mg | ORAL_TABLET | ORAL | Status: AC
  Administered 2012-04-24: 5 mg via ORAL
  Filled 2012-04-24: qty 1

## 2012-04-24 NOTE — Progress Notes (Signed)
Pt is using our machine but his mask

## 2012-04-24 NOTE — Progress Notes (Signed)
History and Physical  Patient ID: [name] MRN: [id number], SOB: 1955/11/27 56 y.o. Date of Encounter: 04/24/2012, 5:43 PM  Primary Physician: [name], MD Primary Cardiologist: sk Primary Electrophysiologist:  sk  Chief Complaint: afb  History of Present Illness: [name] is a 56 y.o. male  Admitted for tikosyn.  He  Held sinus for aobut 24 hours following cardioversion, felt considerably better.  He would like another attempt at rhythm control   He was seen last week for the first time   because of newly identified(newly reidentified) atrial fibrillation. It was first noted in 2001. He had no recurrent problems until it was noted on physical examination last month. The patient noted significant worsening of his exercise tolerance over the last 3-4 months characterized by fatigue, dyspnea, and lightheadedness. He had had more edema but no palpitations.  Myoview undertaken a year ago demonstrated a small inferior wall infarct involving also the apex with depressed left ventricular function and an ejection fraction of 44%. There was no ischemia.  Thromboembolic risk factors are notable for hypertension-1, diabetes-1, congestive failure-#1, possible vascular disease-1 for a CHADS-VASc score of 3-4. Warfarin and Lopressor were initiated at his last visit with Dr. Jonny Ruiz. He has noted no significant improvement.  He has a history of remote syncope. This occurred with his atrial fibrillation.  He has sleep apnea for which therapy was initiated 3 months ago.     Past Medical History  Diagnosis Date  . ALLERGIC RHINITIS   . ANXIETY   . ASTHMA   . Chronic pain syndrome   . BENIGN PROSTATIC HYPERTROPHY   . DEPRESSION   . DIABETES MELLITUS, TYPE II   . DYSKINESIA, ESOPHAGUS   . FIBROMYALGIA   . GERD   . HYPERLIPIDEMIA   . HYPERTENSION   . INSOMNIA   . LOW BACK PAIN   . Morbid obesity     Anticipated LAP-BAND surgery  . NEPHROLITHIASIS, HX OF   . OBSESSIVE-COMPULSIVE  DISORDER   . ORCHITIS   . PERIPHERAL EDEMA   . PLANTAR FASCIITIS, BILATERAL   . POLYARTHRALGIA   . Rash and other nonspecific skin eruption 04/12/2009  . SLEEP APNEA, OBSTRUCTIVE   . Past myocardial infarction 04/19/2011  . Atrial fibrillation     Initially identified 2001, recurring 2013  . Cardiomyopathy     Myoview with prior infarct- 2012; repeat pending  . Syncope     Associated with atrial fibrillation 2001     Past Surgical History  Procedure Date  . Tonsillectomy   . Acne cyst removal     2 on back   . Cardioversion 04/18/2012    Procedure: CARDIOVERSION;  Surgeon: Pricilla Riffle, MD;  Location: [facility] OR;  Service: Cardiovascular;  Laterality: N/A;      Current Facility-Administered Medications  Medication Dose Route Frequency Provider Last Rate Last Dose  . 0.9 %  sodium chloride infusion  250 mL Intravenous PRN Duke Salvia, MD      . 0.9 %  sodium chloride infusion  250 mL Intravenous Continuous Duke Salvia, MD      . 0.9 %  sodium chloride infusion   Intravenous Continuous Duke Salvia, MD      . albuterol (PROVENTIL HFA;VENTOLIN HFA) 108 (90 BASE) MCG/ACT inhaler 2 puff  2 puff Inhalation Q4H PRN Duke Salvia, MD      . ALPRAZolam (XANAX XR) 24 hr tablet 1 mg  1 mg Oral QHS PRN Duke Salvia,  MD      . digoxin (LANOXIN) tablet 0.25 mg  0.25 mg Oral Daily Duke Salvia, MD   0.25 mg at 04/24/12 1737  . doxepin (SINEQUAN) capsule 25 mg  25 mg Oral BID Duke Salvia, MD      . Fluticasone-Salmeterol (ADVAIR) 250-50 MCG/DOSE inhaler 1 puff  1 puff Inhalation Daily Duke Salvia, MD      . furosemide (LASIX) tablet 40 mg  40 mg Oral Daily Duke Salvia, MD   40 mg at 04/24/12 1736  . hydrocortisone cream 1 % 1 application  1 application Topical TID PRN Duke Salvia, MD      . ibuprofen (ADVIL,MOTRIN) tablet 800 mg  800 mg Oral BID PRN Duke Salvia, MD      . metFORMIN (GLUCOPHAGE) tablet 500 mg  500 mg Oral BID WC Duke Salvia, MD   500 mg at 04/24/12 1736    . metoprolol (LOPRESSOR) tablet 50 mg  50 mg Oral BID Duke Salvia, MD      . pantoprazole (PROTONIX) EC tablet 40 mg  40 mg Oral Q1200 Duke Salvia, MD      . sertraline (ZOLOFT) tablet 200 mg  200 mg Oral Daily Duke Salvia, MD   200 mg at 04/24/12 1737  . sodium chloride 0.9 % injection 3 mL  3 mL Intravenous Q12H Duke Salvia, MD      . sodium chloride 0.9 % injection 3 mL  3 mL Intravenous PRN Duke Salvia, MD      . sodium chloride 0.9 % injection 3 mL  3 mL Intravenous Q12H Duke Salvia, MD      . sodium chloride 0.9 % injection 3 mL  3 mL Intravenous PRN Duke Salvia, MD      . traZODone (DESYREL) tablet 50 mg  50 mg Oral QHS Duke Salvia, MD      . warfarin (COUMADIN) tablet 2.5 mg  2.5 mg Oral Custom Duke Salvia, MD      . warfarin (COUMADIN) tablet 5 mg  5 mg Oral Custom Duke Salvia, MD   5 mg at 04/24/12 1737  . Warfarin - Physician Dosing Inpatient   Does not apply q1800 Duke Salvia, MD      . DISCONTD: potassium chloride SA (K-DUR,KLOR-CON) CR tablet 40 mEq  40 mEq Oral UD Duke Salvia, MD      . DISCONTD: warfarin (COUMADIN) tablet 2.5-5 mg  2.5-5 mg Oral Daily Duke Salvia, MD         Allergies: Allergies  Allergen Reactions  . Simvastatin     REACTION: myalgia  . Statins Other (See Comments)    myalgia     History  Substance Use Topics  . Smoking status: Former Smoker -- 2.0 packs/day for 30 years    Types: Cigarettes    Quit date: 10/16/2007  . Smokeless tobacco: Not on file  . Alcohol Use: No      Family History  Problem Relation Age of Onset  . COPD Mother   . Asthma Mother   . Colon polyps Mother   . Allergies Mother   . Asthma Maternal Grandmother       ROS:  Please see the history of present illness.     All other systems reviewed and negative.   Vital Signs: Blood pressure 112/74, pulse 65, temperature 97.8 F (36.6 C), temperature source Oral, resp. rate 18, height  5\' 10"  (1.778 m), weight 335 lb 1.6 oz (152  kg), SpO2 93.00%.  PHYSICAL EXAM: General:  Well nourished, well developed mobidly obese male in no acute distress  HEENT: normal Lymph: no adenopathy Neck: no JVD Endocrine:  No thryomegaly Vascular: No carotid bruits; FA pulses 2+ bilaterally without bruits Cardiac:  normal S1, S2; irregualr irregular Back: without kyphosis/scoliosis, no CVA tenderness Lungs:  clear to auscultation bilaterally, no wheezing, rhonchi or rales Abd: soft, nontender, no hepatomegaly Ext:1-2+ edema Musculoskeletal:  No deformities, BUE and BLE strength normal and equal Skin: warm and dry Neuro:  CNs 2-12 intact, no focal abnormalities noted Psych:  Normal affect   EKG: pending Labs:   Lab Results  Component Value Date   WBC 8.4 04/08/2012   HGB 15.4 04/08/2012   HCT 46.3 04/08/2012   MCV 95.6 04/08/2012   PLT 210.0 04/08/2012     Lab 04/24/12 1544  NA 140  K 4.5  CL 102  CO2 27  BUN 13  CREATININE 0.78  CALCIUM 9.3  PROT --  BILITOT --  ALKPHOS --  ALT --  AST --  GLUCOSE 95   No results found for this basename: CKTOTAL:4,CKMB:4,TROPONINI:4 in the last 72 hours Lab Results  Component Value Date   CHOL 204* 03/21/2012   HDL 60.40 03/21/2012   LDLCALC 146* 01/04/2012   TRIG 124.0 03/21/2012   No results found for this basename: DDIMER   BNP Pro B Natriuretic peptide (BNP)  Date/Time Value Range Status  07/01/2007  3:57 PM 43.0  0.0-100.0 pg/mL Final    INR 1.4 although was 2.2 last weekand he says he has been taking meds   ASSESSMENT AND PLAN:   Patient Active Hospital Problem List: Atrial fibrillation ()   Chronic diastolic heart failure (04/15/2012)   DIABETES MELLITUS, TYPE II (06/09/2007)   Morbid obesity (06/03/2007)   SLEEP APNEA, OBSTRUCTIVE (10/21/2007)    Pt admitted for tikosyn, with therapeutic INR last week, today 1.4  Will begin heparin and have pharmacy assist with coumadin and plan TEE in am with DCCV if can arrange adn then begin tiksoyn  Will need to stay for three  days thereafter  Give lasix tonight for diuresis and will stop dig as LV function neaar normal  Sherryl Manges, MD 04/24/2012 5:51 PM

## 2012-04-24 NOTE — Progress Notes (Addendum)
ANTICOAGULATION CONSULT NOTE - Initial Consult  Pharmacy Consult for Coumadin and Heparin  Indication: atrial fibrillation  Allergies  Allergen Reactions  . Simvastatin     REACTION: myalgia  . Statins Other (See Comments)    myalgia    Patient Measurements: Height: 5\' 10"  (177.8 cm) Weight: 335 lb 1.6 oz (152 kg) IBW/kg (Calculated) : 73  Heparin Dosing Weight: 109.5 kg  Vital Signs: Temp: 97.8 F (36.6 C) (07/11 1107) Temp src: Oral (07/11 1107) BP: 112/74 mmHg (07/11 1107) Pulse Rate: 65  (07/11 1107)  Labs:  Basename 04/24/12 1544 04/24/12 1434 04/22/12 1709  HGB -- -- --  HCT -- -- --  PLT -- -- --  APTT -- -- --  LABPROT 17.8* 17.5* --  INR 1.44 1.41 --  HEPARINUNFRC -- -- --  CREATININE 0.78 0.77 0.8  CKTOTAL -- -- --  CKMB -- -- --  TROPONINI -- -- --    Estimated Creatinine Clearance: 150.7 ml/min (by C-G formula based on Cr of 0.78).   Medical History: Past Medical History  Diagnosis Date  . ALLERGIC RHINITIS   . ANXIETY   . ASTHMA   . Chronic pain syndrome   . BENIGN PROSTATIC HYPERTROPHY   . DEPRESSION   . DIABETES MELLITUS, TYPE II   . DYSKINESIA, ESOPHAGUS   . FIBROMYALGIA   . GERD   . HYPERLIPIDEMIA   . HYPERTENSION   . INSOMNIA   . LOW BACK PAIN   . Morbid obesity     Anticipated LAP-BAND surgery  . NEPHROLITHIASIS, HX OF   . OBSESSIVE-COMPULSIVE DISORDER   . ORCHITIS   . PERIPHERAL EDEMA   . PLANTAR FASCIITIS, BILATERAL   . POLYARTHRALGIA   . Rash and other nonspecific skin eruption 04/12/2009  . SLEEP APNEA, OBSTRUCTIVE   . Past myocardial infarction 04/19/2011  . Atrial fibrillation     Initially identified 2001, recurring 2013  . Cardiomyopathy     Myoview with prior infarct- 2012; repeat pending  . Syncope     Associated with atrial fibrillation 2001    Medications:  Scheduled:    . doxepin  25 mg Oral BID  . Fluticasone-Salmeterol  1 puff Inhalation Daily  . furosemide  40 mg Intravenous BID  . metFORMIN  500 mg  Oral BID WC  . metoprolol  50 mg Oral BID  . pantoprazole  40 mg Oral Q1200  . sertraline  200 mg Oral Daily  . sodium chloride  3 mL Intravenous Q12H  . sodium chloride  3 mL Intravenous Q12H  . traZODone  50 mg Oral QHS  . warfarin  2.5 mg Oral Custom  . warfarin  5 mg Oral Custom  . Warfarin - Physician Dosing Inpatient   Does not apply q1800  . DISCONTD: digoxin  0.25 mg Oral Daily  . DISCONTD: furosemide  40 mg Oral Daily  . DISCONTD: potassium chloride SA  40 mEq Oral UD  . DISCONTD: warfarin  2.5-5 mg Oral Daily    Assessment: 66 YOM admitted 04/24/12 for Tikosyn initiation, however INR is 1.41 with repeat at 1.44 (although was at 2.2 on 04/18/12 on home dose of 2.5mg  on Monday and Friday and 5mg  all other days; per MD's note patient has not missed any doses). Coumadin orders entered from home and 5mg  dose given this pm. Plan for DCCV with TEE in AM and possible initiation of Tikosyn after that time. No CBC available. CrCl >100. No bleeding per RN.   Goal of Therapy:  INR 2-3 Heparin level 0.3-0.7 units/ml Monitor platelets by anticoagulation protocol: Yes   Plan:  1. STAT CBC. 2. Extra dose of 2.5mg  of Coumadin tonight.  3. Daily PT/INR.  4. After CBC drawn, will start Heparin with bolus of 2500 units, and drip at 1300 units/hr (13 ml/hr). 5. Will follow-up CBC and make adjustments if necessary.  6. Heparin level 6 hours after rate initiated.   [name] 04/24/2012,6:06 PM  Addendum: CBC wnl. Continue heparin at current dosing.  [name], PharmD, BCPS Clinical Pharmacist [phone] 04/24/2012, 7:22 PM

## 2012-04-25 ENCOUNTER — Encounter (HOSPITAL_COMMUNITY)
Admission: AD | Disposition: A | Discharge status: Home / Self Care | Payer: Self-pay | Source: Ambulatory Visit | Attending: Internal Medicine

## 2012-04-25 ENCOUNTER — Ambulatory Visit: Payer: Medicare HMO | Admitting: Internal Medicine

## 2012-04-25 ENCOUNTER — Inpatient Hospital Stay (HOSPITAL_COMMUNITY): Payer: Medicare HMO | Admitting: Anesthesiology

## 2012-04-25 ENCOUNTER — Ambulatory Visit (INDEPENDENT_AMBULATORY_CARE_PROVIDER_SITE_OTHER): Payer: Medicare HMO | Admitting: General Surgery

## 2012-04-25 ENCOUNTER — Encounter (HOSPITAL_COMMUNITY): Payer: Self-pay | Admitting: *Deleted

## 2012-04-25 ENCOUNTER — Other Ambulatory Visit (HOSPITAL_COMMUNITY): Payer: Medicare HMO

## 2012-04-25 ENCOUNTER — Encounter (HOSPITAL_COMMUNITY): Payer: Self-pay | Admitting: Anesthesiology

## 2012-04-25 ENCOUNTER — Encounter (HOSPITAL_COMMUNITY): Payer: Self-pay | Admitting: Certified Registered"

## 2012-04-25 DIAGNOSIS — I4891 Unspecified atrial fibrillation: Secondary | ICD-10-CM

## 2012-04-25 HISTORY — PX: TEE WITHOUT CARDIOVERSION: SHX5443

## 2012-04-25 HISTORY — PX: CARDIOVERSION: SHX1299

## 2012-04-25 LAB — BASIC METABOLIC PANEL
BUN: 13 mg/dL (ref 6–23)
BUN: 13 mg/dL (ref 6–23)
BUN: 16 mg/dL (ref 6–23)
CO2: 32 mEq/L (ref 19–32)
Chloride: 96 mEq/L (ref 96–112)
Chloride: 98 mEq/L (ref 96–112)
Creatinine, Ser: 0.83 mg/dL (ref 0.50–1.35)
Creatinine, Ser: 1.02 mg/dL (ref 0.50–1.35)
GFR calc Af Amer: >90 mL/min (ref >90)
GFR calc non Af Amer: >90 mL/min (ref >90)
GFR calc non Af Amer: 80 mL/min — ABNORMAL LOW (ref >90)
Glucose, Bld: 97 mg/dL (ref 70–99)
Glucose, Bld: 89 mg/dL (ref 70–99)
Glucose, Bld: 97 mg/dL (ref 70–99)
Potassium: 4.2 mEq/L (ref 3.5–5.1)

## 2012-04-25 LAB — CBC
HCT: 49.9 % (ref 39.0–52.0)
Hemoglobin: 16.2 g/dL (ref 13.0–17.0)
MCH: 32.3 pg (ref 26.0–34.0)
MCHC: 34.5 g/dL (ref 30.0–36.0)
MCHC: 34.6 g/dL (ref 30.0–36.0)
MCV: 94 fL (ref 78.0–100.0)
RDW: 12.8 % (ref 11.5–15.5)

## 2012-04-25 LAB — GLUCOSE, CAPILLARY
Glucose-Capillary: 82 mg/dL (ref 70–99)
Glucose-Capillary: 87 mg/dL (ref 70–99)

## 2012-04-25 LAB — HEPARIN LEVEL (UNFRACTIONATED): Heparin Unfractionated: 0.62 IU/mL (ref 0.30–0.70)

## 2012-04-25 LAB — PROTIME-INR: Prothrombin Time: 17 seconds — ABNORMAL HIGH (ref 11.6–15.2)

## 2012-04-25 SURGERY — ECHOCARDIOGRAM, TRANSESOPHAGEAL
Anesthesia: Moderate Sedation

## 2012-04-25 SURGERY — CARDIOVERSION
Anesthesia: Monitor Anesthesia Care | Wound class: Clean

## 2012-04-25 MED ORDER — DOFETILIDE 500 MCG PO CAPS
500.0000 ug | ORAL_CAPSULE | Freq: Two times a day (BID) | ORAL | Status: DC
  Administered 2012-04-25 – 2012-04-28 (×6): 500 ug via ORAL
  Filled 2012-04-25 (×8): qty 1

## 2012-04-25 MED ORDER — SODIUM CHLORIDE 0.9 % IV SOLN
INTRAVENOUS | Status: DC | PRN
  Administered 2012-04-25: 14:00:00 via INTRAVENOUS

## 2012-04-25 MED ORDER — SODIUM CHLORIDE 0.9 % IV SOLN: 250.0000 mL | INTRAVENOUS | Status: DC | PRN

## 2012-04-25 MED ORDER — LIDOCAINE HCL (CARDIAC) 20 MG/ML IV SOLN
INTRAVENOUS | Status: DC | PRN
  Administered 2012-04-25: 100 mg via INTRAVENOUS

## 2012-04-25 MED ORDER — ONDANSETRON HCL 4 MG/2ML IJ SOLN: 4.0000 mg | Freq: Once | INTRAMUSCULAR | Status: AC | PRN

## 2012-04-25 MED ORDER — ONDANSETRON HCL 4 MG/2ML IJ SOLN
4.0000 mg | Freq: Four times a day (QID) | INTRAMUSCULAR | Status: DC | PRN
  Administered 2012-04-25 – 2012-04-26 (×2): 4 mg via INTRAVENOUS
  Filled 2012-04-25 (×2): qty 2

## 2012-04-25 MED ORDER — SUCCINYLCHOLINE CHLORIDE 20 MG/ML IJ SOLN: 250.0000 mg | INTRAVENOUS | Status: DC | PRN

## 2012-04-25 MED ORDER — HYDROMORPHONE HCL PF 1 MG/ML IJ SOLN: 0.2500 mg | INTRAMUSCULAR | Status: DC | PRN

## 2012-04-25 MED ORDER — BENZOCAINE 20 % MT SOLN
1.0000 "application " | OROMUCOSAL | Status: DC | PRN
  Filled 2012-04-25: qty 57

## 2012-04-25 MED ORDER — WARFARIN SODIUM 5 MG PO TABS
5.0000 mg | ORAL_TABLET | Freq: Once | ORAL | Status: AC
  Administered 2012-04-25: 5 mg via ORAL
  Filled 2012-04-25: qty 1

## 2012-04-25 MED ORDER — SODIUM CHLORIDE 0.9 % IJ SOLN
3.0000 mL | Freq: Two times a day (BID) | INTRAMUSCULAR | Status: DC
  Administered 2012-04-25 – 2012-04-28 (×6): 3 mL via INTRAVENOUS

## 2012-04-25 MED ORDER — PROPOFOL 10 MG/ML IV EMUL
INTRAVENOUS | Status: DC | PRN
  Administered 2012-04-25: 110 ug/kg/min via INTRAVENOUS

## 2012-04-25 MED ORDER — SODIUM CHLORIDE 0.9 % IJ SOLN: 3.0000 mL | Freq: Two times a day (BID) | INTRAMUSCULAR | Status: DC

## 2012-04-25 MED ORDER — FUROSEMIDE 10 MG/ML IJ SOLN
40.0000 mg | Freq: Every day | INTRAMUSCULAR | Status: DC
  Administered 2012-04-25 – 2012-04-28 (×4): 40 mg via INTRAVENOUS
  Filled 2012-04-25 (×4): qty 4

## 2012-04-25 MED ORDER — SODIUM CHLORIDE 0.45 % IV SOLN: INTRAVENOUS | Status: DC

## 2012-04-25 MED ORDER — PHENYLEPHRINE HCL 10 MG/ML IJ SOLN
INTRAMUSCULAR | Status: DC | PRN
  Administered 2012-04-25 (×3): 40 ug via INTRAVENOUS

## 2012-04-25 MED ORDER — FENTANYL CITRATE 0.05 MG/ML IJ SOLN: 250.0000 ug | Freq: Once | INTRAMUSCULAR | Status: DC

## 2012-04-25 MED ORDER — PROPOFOL 10 MG/ML IV EMUL
INTRAVENOUS | Status: DC | PRN
  Administered 2012-04-25: 200 mg via INTRAVENOUS

## 2012-04-25 MED ORDER — CARISOPRODOL 350 MG PO TABS
1050.0000 mg | ORAL_TABLET | Freq: Every day | ORAL | Status: DC
  Administered 2012-04-25 – 2012-04-27 (×3): 1050 mg via ORAL
  Filled 2012-04-25 (×4): qty 3

## 2012-04-25 MED ORDER — SODIUM CHLORIDE 0.9 % IJ SOLN: 3.0000 mL | INTRAMUSCULAR | Status: DC | PRN

## 2012-04-25 MED ORDER — ALPRAZOLAM 0.5 MG PO TABS
1.0000 mg | ORAL_TABLET | Freq: Every evening | ORAL | Status: DC | PRN
  Administered 2012-04-25 – 2012-04-27 (×3): 1 mg via ORAL
  Filled 2012-04-25: qty 1
  Filled 2012-04-25 (×2): qty 2
  Filled 2012-04-25: qty 1

## 2012-04-25 MED ORDER — HEPARIN BOLUS VIA INFUSION
3000.0000 [IU] | Freq: Once | INTRAVENOUS | Status: AC
  Administered 2012-04-25: 3000 [IU] via INTRAVENOUS
  Filled 2012-04-25: qty 3000

## 2012-04-25 MED ORDER — SUCCINYLCHOLINE CHLORIDE 20 MG/ML IJ SOLN
INTRAMUSCULAR | Status: DC | PRN
  Administered 2012-04-25: 100 mg via INTRAVENOUS

## 2012-04-25 MED ORDER — MIDAZOLAM HCL 10 MG/2ML IJ SOLN: 10.0000 mg | Freq: Once | INTRAMUSCULAR | Status: DC

## 2012-04-25 NOTE — Progress Notes (Signed)
Patient Name: [name]      SUBJECTIVE: good diuresis last night  But didn't  Sleep   Past Medical History  Diagnosis Date  . ALLERGIC RHINITIS   . ANXIETY   . ASTHMA   . Chronic pain syndrome   . BENIGN PROSTATIC HYPERTROPHY   . DEPRESSION   . DIABETES MELLITUS, TYPE II   . DYSKINESIA, ESOPHAGUS   . FIBROMYALGIA   . GERD   . HYPERLIPIDEMIA   . HYPERTENSION   . INSOMNIA   . LOW BACK PAIN   . Morbid obesity     Anticipated LAP-BAND surgery  . NEPHROLITHIASIS, HX OF   . OBSESSIVE-COMPULSIVE DISORDER   . ORCHITIS   . PERIPHERAL EDEMA   . PLANTAR FASCIITIS, BILATERAL   . POLYARTHRALGIA   . Rash and other nonspecific skin eruption 04/12/2009  . SLEEP APNEA, OBSTRUCTIVE   . Past myocardial infarction 04/19/2011  . Atrial fibrillation     Initially identified 2001, recurring 2013  . Cardiomyopathy     Myoview with prior infarct- 2012; repeat pending  . Syncope     Associated with atrial fibrillation 2001    PHYSICAL EXAM Filed Vitals:   04/24/12 1107 04/24/12 2005 04/25/12 0538 04/25/12 0634  BP: 112/74 112/70 121/73   Pulse: 65 62 80   Temp: 97.8 F (36.6 C) 97.3 F (36.3 C) 98.3 F (36.8 C)   TempSrc: Oral Oral Oral   Resp: 18 18 16   Height: 5' 10" (1.778 m)     Weight: 335 lb 1.6 oz (152 kg)     SpO2: 93% 94% 95% 94%    Well developed and nourished in no acute distress HENT normal Neck supple with JVP-flat Clear Irregular rate and rhythm,  With RVR no murmurs or gallops Abd-soft with active BS No Clubbing cyanosis  Min edema edema Skin-warm and dry A & Oriented  Grossly normal sensory and motor function   TELEMETRY: Reviewed telemetry pt in  afib RVR:  1/O  Not measured  Intake/Output Summary (Last 24 hours) at 04/25/12 0746 Last data filed at 04/24/12 1744  Gross per 24 hour  Intake    120 ml  Output      0 ml  Net    120 ml    LABS: Basic Metabolic Panel:  Lab 04/25/12 0035 04/24/12 1544 04/24/12 1434 04/22/12 1709  NA 137 140  140 138  K 4.2 4.5 4.8 3.8  CL 96 102 103 100  CO2 29 27 28 30  GLUCOSE 97 95 88 100*  BUN 13 13 14 16  CREATININE 0.84 0.78 0.77 0.8  CALCIUM 10.0 9.3 -- --  MG -- -- 1.9 1.9  PHOS -- -- -- --   Cardiac Enzymes: No results found for this basename: CKTOTAL:3,CKMB:3,CKMBINDEX:3,TROPONINI:3 in the last 72 hours CBC:  Lab 04/25/12 0650 04/25/12 0035 04/24/12 1544  WBC 12.5* 10.1 9.2  NEUTROABS -- -- --  HGB 17.2* 16.2 14.7  HCT 49.9 46.8 43.0  MCV 94.0 93.2 93.7  PLT 234 210 184   PROTIME:  Basename 04/25/12 0035 04/24/12 1544 04/24/12 1434  LABPROT 17.0* 17.8* 17.5*  INR 1.36 1.44 1.41      ASSESSMENT AND PLAN:  Patient Active Hospital Problem List: Atrial fibrillation ()   Chronic diastolic heart failure (04/15/2012)   DIABETES MELLITUS, TYPE II (06/09/2007)   Morbid obesity (06/03/2007)   SLEEP APNEA, OBSTRUCTIVE (10/21/2007)   Continue  IV diuretics today cahnge to po in am TEE DCCV today   Initiate tikosyn 500 mg q 12 after wards Anticipate d/c Monday     Signed, Steven Klein MD  04/25/2012   

## 2012-04-25 NOTE — Progress Notes (Signed)
ANTICOAGULATION CONSULT NOTE - Initial Consult  Pharmacy Consult for Coumadin and Heparin  Indication: atrial fibrillation   MAssessment: 56 YOM admitted 04/24/12 for Tikosyn initiation, on coumadin and heparin, INR (1.34) subtherapeutic this am, heparin level is also subtherapeutic despite rate increase this morning, spoke with RN, no interruption with infusion noted. Plan for DCCV with this afternoon, possible initiation of Tikosyn DCCV.  Hgb/Plt stable, CrCl >100. No bleeding per chart  Home coumadin dose of 2.5mg  on Monday and Friday and 5mg  all other days; per MD's note patient has not missed any doses).   Goal of Therapy:  INR 2-3 Heparin level 0.3-0.7 units/ml Monitor platelets by anticoagulation protocol: Yes   Plan:  1. Will give heparin bolus 3000 units then increase IV Heparin to 1800 units/hr 2.  Check a heparin level 6 hours after increasing 3. Coumadin 5mg  po x 1 today 4. F/u daily INR, heparin level and CBC   Bayard Hugger, PharmD, BCPS  Clinical Pharmacist  Pager: [phone]  Thank you for allowing pharmacy to be part of this patients care team. 04/25/2012,11:17 AM   Allergies  Allergen Reactions  . Simvastatin     REACTION: myalgia  . Statins Other (See Comments)    myalgia   Patient Measurements: Height: 5\' 10"  (177.8 cm) Weight: 335 lb 1.6 oz (152 kg) IBW/kg (Calculated) : 73  Heparin Dosing Weight: 109.5 kg  Vital Signs: Temp: 98.3 F (36.8 C) (07/12 0538) Temp src: Oral (07/12 0538) BP: 121/73 mmHg (07/12 0538) Pulse Rate: 80  (07/12 0538)  Labs:  Basename 04/25/12 1014 04/25/12 0650 04/25/12 0035 04/24/12 1544  HGB -- 17.2* 16.2 --  HCT -- 49.9 46.8 43.0  PLT -- 234 210 184  APTT -- -- -- --  LABPROT -- 16.8* 17.0* 17.8*  INR -- 1.34 1.36 1.44  HEPARINUNFRC <0.10* -- <0.10* --  CREATININE -- 0.83 0.84 0.78  CKTOTAL -- -- -- --  CKMB -- -- -- --  TROPONINI -- -- -- --   Estimated Creatinine Clearance: 145.3 ml/min (by C-G formula based on  Cr of 0.83).  Medical History: Past Medical History  Diagnosis Date  . ALLERGIC RHINITIS   . ANXIETY   . ASTHMA   . Chronic pain syndrome   . BENIGN PROSTATIC HYPERTROPHY   . DEPRESSION   . DIABETES MELLITUS, TYPE II   . DYSKINESIA, ESOPHAGUS   . FIBROMYALGIA   . GERD   . HYPERLIPIDEMIA   . HYPERTENSION   . INSOMNIA   . LOW BACK PAIN   . Morbid obesity     Anticipated LAP-BAND surgery  . NEPHROLITHIASIS, HX OF   . OBSESSIVE-COMPULSIVE DISORDER   . ORCHITIS   . PERIPHERAL EDEMA   . PLANTAR FASCIITIS, BILATERAL   . POLYARTHRALGIA   . Rash and other nonspecific skin eruption 04/12/2009  . SLEEP APNEA, OBSTRUCTIVE   . Past myocardial infarction 04/19/2011  . Atrial fibrillation     Initially identified 2001, recurring 2013  . Cardiomyopathy     Myoview with prior infarct- 2012; repeat pending  . Syncope     Associated with atrial fibrillation 2001

## 2012-04-25 NOTE — H&P (View-Only) (Signed)
Patient Name: [name]      SUBJECTIVE: good diuresis last night  But didn't  Sleep   Past Medical History  Diagnosis Date  . ALLERGIC RHINITIS   . ANXIETY   . ASTHMA   . Chronic pain syndrome   . BENIGN PROSTATIC HYPERTROPHY   . DEPRESSION   . DIABETES MELLITUS, TYPE II   . DYSKINESIA, ESOPHAGUS   . FIBROMYALGIA   . GERD   . HYPERLIPIDEMIA   . HYPERTENSION   . INSOMNIA   . LOW BACK PAIN   . Morbid obesity     Anticipated LAP-BAND surgery  . NEPHROLITHIASIS, HX OF   . OBSESSIVE-COMPULSIVE DISORDER   . ORCHITIS   . PERIPHERAL EDEMA   . PLANTAR FASCIITIS, BILATERAL   . POLYARTHRALGIA   . Rash and other nonspecific skin eruption 04/12/2009  . SLEEP APNEA, OBSTRUCTIVE   . Past myocardial infarction 04/19/2011  . Atrial fibrillation     Initially identified 2001, recurring 2013  . Cardiomyopathy     Myoview with prior infarct- 2012; repeat pending  . Syncope     Associated with atrial fibrillation 2001    PHYSICAL EXAM Filed Vitals:   04/24/12 1107 04/24/12 2005 04/25/12 0538 04/25/12 0634  BP: 112/74 112/70 121/73   Pulse: 65 62 80   Temp: 97.8 F (36.6 C) 97.3 F (36.3 C) 98.3 F (36.8 C)   TempSrc: Oral Oral Oral   Resp: 18 18 16    Height: 5\' 10"  (1.778 m)     Weight: 335 lb 1.6 oz (152 kg)     SpO2: 93% 94% 95% 94%    Well developed and nourished in no acute distress HENT normal Neck supple with JVP-flat Clear Irregular rate and rhythm,  With RVR no murmurs or gallops Abd-soft with active BS No Clubbing cyanosis  Min edema edema Skin-warm and dry A & Oriented  Grossly normal sensory and motor function   TELEMETRY: Reviewed telemetry pt in  afib RVR:  1/O  Not measured  Intake/Output Summary (Last 24 hours) at 04/25/12 0746 Last data filed at 04/24/12 1744  Gross per 24 hour  Intake    120 ml  Output      0 ml  Net    120 ml    LABS: Basic Metabolic Panel:  Lab 04/25/12 2952 04/24/12 1544 04/24/12 1434 04/22/12 1709  NA 137 140  140 138  K 4.2 4.5 4.8 3.8  CL 96 102 103 100  CO2 29 27 28 30   GLUCOSE 97 95 88 100*  BUN 13 13 14 16   CREATININE 0.84 0.78 0.77 0.8  CALCIUM 10.0 9.3 -- --  MG -- -- 1.9 1.9  PHOS -- -- -- --   Cardiac Enzymes: No results found for this basename: CKTOTAL:3,CKMB:3,CKMBINDEX:3,TROPONINI:3 in the last 72 hours CBC:  Lab 04/25/12 0650 04/25/12 0035 04/24/12 1544  WBC 12.5* 10.1 9.2  NEUTROABS -- -- --  HGB 17.2* 16.2 14.7  HCT 49.9 46.8 43.0  MCV 94.0 93.2 93.7  PLT 234 210 184   PROTIME:  Basename 04/25/12 0035 04/24/12 1544 04/24/12 1434  LABPROT 17.0* 17.8* 17.5*  INR 1.36 1.44 1.41      ASSESSMENT AND PLAN:  Patient Active Hospital Problem List: Atrial fibrillation ()   Chronic diastolic heart failure (04/15/2012)   DIABETES MELLITUS, TYPE II (06/09/2007)   Morbid obesity (06/03/2007)   SLEEP APNEA, OBSTRUCTIVE (10/21/2007)   Continue  IV diuretics today cahnge to po in am TEE DCCV today  Initiate tikosyn 500 mg q 12 after wards Anticipate d/c Monday     Signed, Sherryl Manges MD  04/25/2012

## 2012-04-25 NOTE — Care Management Note (Signed)
  Page 1 of 1   04/25/2012     3:09:32 PM   CARE MANAGEMENT NOTE 04/25/2012  Patient:  [name], [name]   Account Number:  [id number]  Date Initiated:  04/25/2012  Documentation initiated by:  Alvira Philips Assessment:   56 yr-old male adm with dx of AFib     DC Planning Services  CM consult      Comments:  PCP:  Dr. [name]  04/25/12 1500 Antwuan Eckley RN MSN CCM Pt to d/c on Tikosyn.  Benefits check reveals his copay will be $90/30-day supply @ retail pharmacy, $260/90-day supply by mail order - pt indicates he will be able to manage copay.  TC to [facility], they will order Tikosyn and will have it in stock by 7/15.

## 2012-04-25 NOTE — Anesthesia Preprocedure Evaluation (Addendum)
Anesthesia Evaluation  Patient identified by MRN, date of birth, ID band Patient awake    Reviewed: Allergy & Precautions, H&P , NPO status , Patient's Chart, lab work & pertinent test results, reviewed documented beta blocker date and time   Airway Mallampati: III TM Distance: <3 FB Neck ROM: Full  Mouth opening: Limited Mouth Opening  Dental  (+) Teeth Intact and Dental Advisory Given   Pulmonary asthma , sleep apnea and Continuous Positive Airway Pressure Ventilation ,    + wheezing      Cardiovascular hypertension, Pt. on medications + Past MI + dysrhythmias Atrial Fibrillation Rhythm:Irregular Rate:Normal     Neuro/Psych Anxiety Depression    GI/Hepatic hiatal hernia, GERD-  ,  Endo/Other  Well Controlled, Type 2, Oral Hypoglycemic Agents  Renal/GU      Musculoskeletal   Abdominal (+)  Abdomen: soft. Bowel sounds: normal.  Peds  Hematology   Anesthesia Other Findings   Reproductive/Obstetrics                         Anesthesia Physical Anesthesia Plan  ASA: III  Anesthesia Plan: General   Post-op Pain Management:    Induction: Intravenous  Airway Management Planned: Oral ETT  Additional Equipment:   Intra-op Plan:   Post-operative Plan:   Informed Consent: I have reviewed the patients History and Physical, chart, labs and discussed the procedure including the risks, benefits and alternatives for the proposed anesthesia with the patient or authorized representative who has indicated his/her understanding and acceptance.     Plan Discussed with: CRNA, Anesthesiologist and Surgeon  Anesthesia Plan Comments:         Anesthesia Quick Evaluation

## 2012-04-25 NOTE — H&P (Signed)
Duke Salvia, MD Physician Signed Cardiology Progress Notes 04/24/2012 3:58 PM       History and Physical    Patient ID: MOO GRAVLEY MRN: [id number], SOB: 08-Sep-1956 56 y.o. Date of Encounter: 04/24/2012, 5:43 PM   Primary Physician: [name], MD Primary Cardiologist: sk Primary Electrophysiologist:  sk   Chief Complaint: afb   History of Present Illness: [name] is a 56 y.o. male  Admitted for tikosyn.  He  Held sinus for aobut 24 hours following cardioversion, felt considerably better.  He would like another attempt at rhythm control     He was seen last week for the first time   because of newly identified(newly reidentified) atrial fibrillation. It was first noted in 2001. He had no recurrent problems until it was noted on physical examination last month. The patient noted significant worsening of his exercise tolerance over the last 3-4 months characterized by fatigue, dyspnea, and lightheadedness. He had had more edema but no palpitations.   Myoview undertaken a year ago demonstrated a small inferior wall infarct involving also the apex with depressed left ventricular function and an ejection fraction of 44%. There was no ischemia.   Thromboembolic risk factors are notable for hypertension-1, diabetes-1, congestive failure-#1, possible vascular disease-1 for a CHADS-VASc score of 3-4. Warfarin and Lopressor were initiated at his last visit with Dr. Jonny Ruiz. He has noted no significant improvement.   He has a history of remote syncope. This occurred with his atrial fibrillation.   He has sleep apnea for which therapy was initiated 3 months ago.         Past Medical History   Diagnosis  Date   .  ALLERGIC RHINITIS     .  ANXIETY     .  ASTHMA     .  Chronic pain syndrome     .  BENIGN PROSTATIC HYPERTROPHY     .  DEPRESSION     .  DIABETES MELLITUS, TYPE II     .  DYSKINESIA, ESOPHAGUS     .  FIBROMYALGIA     .  GERD     .  HYPERLIPIDEMIA     .  HYPERTENSION      .  INSOMNIA     .  LOW BACK PAIN     .  Morbid obesity         Anticipated LAP-BAND surgery   .  NEPHROLITHIASIS, HX OF     .  OBSESSIVE-COMPULSIVE DISORDER     .  ORCHITIS     .  PERIPHERAL EDEMA     .  PLANTAR FASCIITIS, BILATERAL     .  POLYARTHRALGIA     .  Rash and other nonspecific skin eruption  04/12/2009   .  SLEEP APNEA, OBSTRUCTIVE     .  Past myocardial infarction  04/19/2011   .  Atrial fibrillation         Initially identified 2001, recurring 2013   .  Cardiomyopathy         Myoview with prior infarct- 2012; repeat pending   .  Syncope         Associated with atrial fibrillation 2001         Past Surgical History   Procedure  Date   .  Tonsillectomy     .  Acne cyst removal         2 on back    .  Cardioversion  04/18/2012  Procedure: CARDIOVERSION;  Surgeon: Pricilla Riffle, MD;  Location: [facility] OR;  Service: Cardiovascular;  Laterality: N/A;         Current Facility-Administered Medications   Medication  Dose  Route  Frequency  Provider  Last Rate  Last Dose   .  0.9 %  sodium chloride infusion   250 mL  Intravenous  PRN  Duke Salvia, MD         .  0.9 %  sodium chloride infusion   250 mL  Intravenous  Continuous  Duke Salvia, MD         .  0.9 %  sodium chloride infusion     Intravenous  Continuous  Duke Salvia, MD         .  albuterol (PROVENTIL HFA;VENTOLIN HFA) 108 (90 BASE) MCG/ACT inhaler 2 puff   2 puff  Inhalation  Q4H PRN  Duke Salvia, MD         .  ALPRAZolam (XANAX XR) 24 hr tablet 1 mg   1 mg  Oral  QHS PRN  Duke Salvia, MD         .  digoxin Margit Banda) tablet 0.25 mg   0.25 mg  Oral  Daily  Duke Salvia, MD     0.25 mg at 04/24/12 1737   .  doxepin (SINEQUAN) capsule 25 mg   25 mg  Oral  BID  Duke Salvia, MD         .  Fluticasone-Salmeterol (ADVAIR) 250-50 MCG/DOSE inhaler 1 puff   1 puff  Inhalation  Daily  Duke Salvia, MD         .  furosemide (LASIX) tablet 40 mg   40 mg  Oral  Daily  Duke Salvia, MD     40 mg at  04/24/12 1736   .  hydrocortisone cream 1 % 1 application   1 application  Topical  TID PRN  Duke Salvia, MD         .  ibuprofen (ADVIL,MOTRIN) tablet 800 mg   800 mg  Oral  BID PRN  Duke Salvia, MD         .  metFORMIN (GLUCOPHAGE) tablet 500 mg   500 mg  Oral  BID WC  Duke Salvia, MD     500 mg at 04/24/12 1736   .  metoprolol (LOPRESSOR) tablet 50 mg   50 mg  Oral  BID  Duke Salvia, MD         .  pantoprazole (PROTONIX) EC tablet 40 mg   40 mg  Oral  Q1200  Duke Salvia, MD         .  sertraline (ZOLOFT) tablet 200 mg   200 mg  Oral  Daily  Duke Salvia, MD     200 mg at 04/24/12 1737   .  sodium chloride 0.9 % injection 3 mL   3 mL  Intravenous  Q12H  Duke Salvia, MD         .  sodium chloride 0.9 % injection 3 mL   3 mL  Intravenous  PRN  Duke Salvia, MD         .  sodium chloride 0.9 % injection 3 mL   3 mL  Intravenous  Q12H  Duke Salvia, MD         .  sodium chloride 0.9 % injection 3 mL  3 mL  Intravenous  PRN  Duke Salvia, MD         .  traZODone (DESYREL) tablet 50 mg   50 mg  Oral  QHS  Duke Salvia, MD         .  warfarin (COUMADIN) tablet 2.5 mg   2.5 mg  Oral  Custom  Duke Salvia, MD         .  warfarin (COUMADIN) tablet 5 mg   5 mg  Oral  Custom  Duke Salvia, MD     5 mg at 04/24/12 1737   .  Warfarin - Physician Dosing Inpatient     Does not apply  q1800  Duke Salvia, MD         .  DISCONTD: potassium chloride SA (K-DUR,KLOR-CON) CR tablet 40 mEq   40 mEq  Oral  UD  Duke Salvia, MD         .  DISCONTD: warfarin (COUMADIN) tablet 2.5-5 mg   2.5-5 mg  Oral  Daily  Duke Salvia, MD              Allergies: Allergies   Allergen  Reactions   .  Simvastatin         REACTION: myalgia   .  Statins  Other (See Comments)       myalgia         History   Substance Use Topics   .  Smoking status:  Former Smoker -- 2.0 packs/day for 30 years       Types:  Cigarettes       Quit date:  10/16/2007   .  Smokeless tobacco:  Not on file    .  Alcohol Use:  No         Family History   Problem  Relation  Age of Onset   .  COPD  Mother     .  Asthma  Mother     .  Colon polyps  Mother     .  Allergies  Mother     .  Asthma  Maternal Grandmother          ROS:  Please see the history of present illness.     All other systems reviewed and negative.    Vital Signs: Blood pressure 112/74, pulse 65, temperature 97.8 F (36.6 C), temperature source Oral, resp. rate 18, height 5\' 10"  (1.778 m), weight 335 lb 1.6 oz (152 kg), SpO2 93.00%.   PHYSICAL EXAM: General:  Well nourished, well developed mobidly obese male in no acute distress   HEENT: normal Lymph: no adenopathy Neck: no JVD Endocrine:  No thryomegaly Vascular: No carotid bruits; FA pulses 2+ bilaterally without bruits  Cardiac:  normal S1, S2; irregualr irregular Back: without kyphosis/scoliosis, no CVA tenderness Lungs:  clear to auscultation bilaterally, no wheezing, rhonchi or rales  Abd: soft, nontender, no hepatomegaly  Ext:1-2+ edema Musculoskeletal:  No deformities, BUE and BLE strength normal and equal Skin: warm and dry  Neuro:  CNs 2-12 intact, no focal abnormalities noted Psych:  Normal affect    EKG: pending Labs:    Lab Results   Component  Value  Date     WBC  8.4  04/08/2012     HGB  15.4  04/08/2012     HCT  46.3  04/08/2012     MCV  95.6  04/08/2012     PLT  210.0  04/08/2012  Lab  04/24/12 1544   NA  140   K  4.5   CL  102   CO2  27   BUN  13   CREATININE  0.78   CALCIUM  9.3   PROT  --   BILITOT  --   ALKPHOS  --   ALT  --   AST  --   GLUCOSE  95    No results found for this basename: CKTOTAL:4,CKMB:4,TROPONINI:4 in the last 72 hours Lab Results   Component  Value  Date     CHOL  204*  03/21/2012     HDL  60.40  03/21/2012     LDLCALC  146*  01/04/2012     TRIG  124.0  03/21/2012    No results found for this basename: DDIMER    BNP Pro B Natriuretic peptide (BNP)   Date/Time  Value  Range  Status   07/01/2007   3:57 PM  43.0   0.0-100.0 pg/mL  Final     INR 1.4 although was 2.2 last weekand he says he has been taking meds    ASSESSMENT AND PLAN:    Patient Active Hospital Problem List: Atrial fibrillation ()  Chronic diastolic heart failure (04/15/2012)  DIABETES MELLITUS, TYPE II (06/09/2007)  Morbid obesity (06/03/2007)  SLEEP APNEA, OBSTRUCTIVE (10/21/2007)    Pt admitted for tikosyn, with therapeutic INR last week, today 1.4  Will begin heparin and have pharmacy assist with coumadin and plan TEE in am with DCCV if can arrange adn then begin tiksoyn  Will need to stay for three days thereafter   Give lasix tonight for diuresis and will stop dig as LV function neaar normal   Sherryl Manges, MD 04/24/2012 5:51 PM

## 2012-04-25 NOTE — Progress Notes (Signed)
  Echocardiogram Echocardiogram Transesophageal has been performed.  Georgian Co 04/25/2012, 2:52 PM

## 2012-04-25 NOTE — Preoperative (Signed)
Beta Blockers   Reason not to administer Beta Blockers:Not Applicable 

## 2012-04-25 NOTE — Progress Notes (Signed)
Patient managed by anesthesia. 1 shock delivered at 200 joules with conversion to NSR. Transported to PACU via stretcher.

## 2012-04-25 NOTE — Transfer of Care (Signed)
Immediate Anesthesia Transfer of Care Note  Patient: [name]  Procedure(s) Performed: Procedure(s) (LRB): CARDIOVERSION (N/A)  Patient Location: PACU  Anesthesia Type: General  Level of Consciousness: awake, alert  and oriented  Airway & Oxygen Therapy: Patient Spontanous Breathing  Post-op Assessment: Report given to PACU RN  Post vital signs: stable  Complications: No apparent anesthesia complications

## 2012-04-25 NOTE — Progress Notes (Signed)
ANTICOAGULATION CONSULT NOTE - Follow Up Consult  Pharmacy Consult for Heparin Indication: atrial fibrillation  Allergies  Allergen Reactions  . Simvastatin     REACTION: myalgia  . Statins Other (See Comments)    myalgia    Patient Measurements: Height: 5\' 10"  (177.8 cm) Weight: 335 lb 1.6 oz (152 kg) IBW/kg (Calculated) : 73  Heparin Dosing Weight: 109.5 kg  Vital Signs: Temp: 97.6 F (36.4 C) (07/12 2115) Temp src: Oral (07/12 2115) BP: 102/59 mmHg (07/12 2115) Pulse Rate: 58  (07/12 2115)  Labs:  Basename 04/25/12 1800 04/25/12 1014 04/25/12 0650 04/25/12 0035 04/24/12 1544  HGB -- -- 17.2* 16.2 --  HCT -- -- 49.9 46.8 43.0  PLT -- -- 234 210 184  APTT -- -- -- -- --  LABPROT -- -- 16.8* 17.0* 17.8*  INR -- -- 1.34 1.36 1.44  HEPARINUNFRC 0.62 <0.10* -- <0.10* --  CREATININE 1.02 -- 0.83 0.84 --  CKTOTAL -- -- -- -- --  CKMB -- -- -- -- --  TROPONINI -- -- -- -- --    Estimated Creatinine Clearance: 118.2 ml/min (by C-G formula based on Cr of 1.02).   Medications:  Heparin 1800 units/hr  Assessment: 56yom s/p DCCV on heparin for Afib while INR subtherapeutic. Heparin level (0.62) is now therapeutic after bolus and rate increase. - H/H and Plts wnl - No significant bleeding per RN  Goal of Therapy:  Heparin level 0.3-0.7 units/ml Monitor platelets by anticoagulation protocol: Yes   Plan:  1. Continue Heparin drip 1800 units/hr (18 ml/hr) 2. Follow-up AM heparin level  Cleon Dew [phone] 04/25/2012,9:29 PM

## 2012-04-25 NOTE — Interval H&P Note (Signed)
History and Physical Interval Note:  04/25/2012 2:18 PM  [name]  has presented today for surgery, with the diagnosis of a fib  The various methods of treatment have been discussed with the patient and family. After consideration of risks, benefits and other options for treatment, the patient has consented to  Procedure(s) (LRB): TRANSESOPHAGEAL ECHOCARDIOGRAM (TEE) (N/A) CARDIOVERSION (N/A) as a surgical intervention .  The patient's history has been reviewed, patient examined, no change in status, stable for surgery.  I have reviewed the patients' chart and labs.  Questions were answered to the patient's satisfaction.     Elyn Aquas.

## 2012-04-25 NOTE — Progress Notes (Signed)
ANTICOAGULATION CONSULT NOTE - Initial Consult  Pharmacy Consult for Coumadin and Heparin  Indication: atrial fibrillation  Allergies  Allergen Reactions  . Simvastatin     REACTION: myalgia  . Statins Other (See Comments)    myalgia   Patient Measurements: Height: 5\' 10"  (177.8 cm) Weight: 335 lb 1.6 oz (152 kg) IBW/kg (Calculated) : 73  Heparin Dosing Weight: 109.5 kg  Vital Signs: Temp: 97.3 F (36.3 C) (07/11 2005) Temp src: Oral (07/11 2005) BP: 112/70 mmHg (07/11 2005) Pulse Rate: 62  (07/11 2005)  Labs:  Basename 04/25/12 0035 04/24/12 1544 04/24/12 1434  HGB 16.2 14.7 --  HCT 46.8 43.0 --  PLT 210 184 --  APTT -- -- --  LABPROT 17.0* 17.8* 17.5*  INR 1.36 1.44 1.41  HEPARINUNFRC <0.10* -- --  CREATININE 0.84 0.78 0.77  CKTOTAL -- -- --  CKMB -- -- --  TROPONINI -- -- --   Estimated Creatinine Clearance: 143.5 ml/min (by C-G formula based on Cr of 0.84).  Medical History: Past Medical History  Diagnosis Date  . ALLERGIC RHINITIS   . ANXIETY   . ASTHMA   . Chronic pain syndrome   . BENIGN PROSTATIC HYPERTROPHY   . DEPRESSION   . DIABETES MELLITUS, TYPE II   . DYSKINESIA, ESOPHAGUS   . FIBROMYALGIA   . GERD   . HYPERLIPIDEMIA   . HYPERTENSION   . INSOMNIA   . LOW BACK PAIN   . Morbid obesity     Anticipated LAP-BAND surgery  . NEPHROLITHIASIS, HX OF   . OBSESSIVE-COMPULSIVE DISORDER   . ORCHITIS   . PERIPHERAL EDEMA   . PLANTAR FASCIITIS, BILATERAL   . POLYARTHRALGIA   . Rash and other nonspecific skin eruption 04/12/2009  . SLEEP APNEA, OBSTRUCTIVE   . Past myocardial infarction 04/19/2011  . Atrial fibrillation     Initially identified 2001, recurring 2013  . Cardiomyopathy     Myoview with prior infarct- 2012; repeat pending  . Syncope     Associated with atrial fibrillation 2001   Medications:  Scheduled:     . carisoprodol  350 mg Oral QHS  . doxepin  25 mg Oral BID  . fentaNYL  250 mcg Intravenous On Call  .  Fluticasone-Salmeterol  1 puff Inhalation Daily  . furosemide  40 mg Intravenous BID  . heparin  2,500 Units Intravenous Once  . metFORMIN  500 mg Oral BID WC  . metoprolol  50 mg Oral BID  . midazolam  10 mg Intravenous On Call  . pantoprazole  40 mg Oral Q1200  . sertraline  200 mg Oral Daily  . sodium chloride  3 mL Intravenous Q12H  . sodium chloride  3 mL Intravenous Q12H  . sodium chloride  3 mL Intravenous Q12H  . traZODone  50 mg Oral QHS  . warfarin  2.5 mg Oral Once  . warfarin  5 mg Oral Custom  . Warfarin - Pharmacist Dosing Inpatient   Does not apply q1800  . DISCONTD: digoxin  0.25 mg Oral Daily  . DISCONTD: furosemide  40 mg Oral Daily  . DISCONTD: potassium chloride SA  40 mEq Oral UD  . DISCONTD: warfarin  2.5 mg Oral Custom  . DISCONTD: warfarin  2.5-5 mg Oral Daily  . DISCONTD: Warfarin - Physician Dosing Inpatient   Does not apply q1800   Assessment: 33 YOM admitted 04/24/12 for Tikosyn initiation, however INR is 1.41 with repeat at 1.44 (although was at 2.2 on 04/18/12 on  home dose of 2.5mg  on Monday and Friday and 5mg  all other days; per MD's note patient has not missed any doses). Coumadin orders entered from home and 5mg  dose given this pm. Plan for DCCV with TEE in AM and possible initiation of Tikosyn after that time. No CBC available. CrCl >100. No bleeding per RN.   Spoke with nurse who states there has been no issues with the IV heparin and that it is running correctly.  No bleeding complications noted.  Goal of Therapy:  INR 2-3 Heparin level 0.3-0.7 units/ml Monitor platelets by anticoagulation protocol: Yes   Plan:  1. Will increase IV Heparin to 1500 units/hr 2.  Check a heparin level 8 hours after increasing   Nadara Mustard, PharmD., MS Clinical Pharmacist Pager:  [phone]  Thank you for allowing pharmacy to be part of this patients care team. 04/25/2012,2:10 AM

## 2012-04-25 NOTE — Anesthesia Procedure Notes (Signed)
Procedure Name: Intubation Date/Time: 04/25/2012 2:16 PM Performed by: Ellin Goodie Pre-anesthesia Checklist: Patient identified, Emergency Drugs available, Suction available, Patient being monitored and Timeout performed Patient Re-evaluated:Patient Re-evaluated prior to inductionOxygen Delivery Method: Circle system utilized Preoxygenation: Pre-oxygenation with 100% oxygen Intubation Type: IV induction Ventilation: Two handed mask ventilation required Laryngoscope Size: Mac and 4 Grade View: Grade III Tube type: Oral Tube size: 8.0 mm Number of attempts: 1 Airway Equipment and Method: Stylet and LTA kit utilized Placement Confirmation: ETT inserted through vocal cords under direct vision,  CO2 detector and breath sounds checked- equal and bilateral Secured at: 23 cm Tube secured with: Tape Dental Injury: Teeth and Oropharynx as per pre-operative assessment

## 2012-04-25 NOTE — Progress Notes (Signed)
Pt O2 sats dropping to 86 to 88.  O2 applied via nasal canula at 2L/min.  Sats up to 95%.

## 2012-04-25 NOTE — CV Procedure (Signed)
    Transesophageal Echocardiogram Note  [name] [id number] 1956-08-01  Procedure: Transesophageal Echocardiogram Indications: Atrial fibrillation  Procedure Details Consent: Obtained Time Out: Verified patient identification, verified procedure, site/side was marked, verified correct patient position, special equipment/implants available, Radiology Safety Procedures followed,  medications/allergies/relevent history reviewed, required imaging and test results available.  Performed  The patient is quite obese and has severe obstructive sleep apnea.  He reported having a respiratory arrest at the dentist office with sedation and dental tools in his mouth.  Given this extra risk, we elected to perform the TEE and cardioversion with anesthesia.  Dr. Diamantina Monks evaluated the patient and agreed that the patient would need intubation for airway protection.    After the ETT tube was placed, I placed the TEE scope with some mild difficulty.    Medications:  Propofol infusion per anesthesia  Left Ventrical:  Markedly reduced LV function. EF around 20-25%  Mitral Valve: trace MR  Aortic Valve: mildly calcified. Nor AS or AI  Tricuspid Valve: trace TR  Pulmonic Valve: not visualized  Left Atrium/ Left atrial appendage: no thrombus,  Contracts well ( flutter like contractions)  Atrial septum: no ASD or PFO by color Doppler  Aorta: not visualized   Complications: No apparent complications Patient did tolerate procedure well.  We proceeded to Cardioversion at this point.   Cardioversion Note  [name] [id number] 1955-12-29  Procedure: DC Cardioversion Indications: Atrial Fib  Procedure Details Consent: Obtained Time Out: Verified patient identification, verified procedure, site/side was marked, verified correct patient position, special equipment/implants available, Radiology Safety Procedures followed,  medications/allergies/relevent history reviewed, required  imaging and test results available.  Performed  The patient has been on adequate anticoagulation.  The patient received IV Propofol drip per anesthesia for sedation.  Synchronous cardioversion was performed at 200 joules.  The cardioversion was successful.    Complications: No apparent complications Patient did tolerate procedure well.  We will start Tikosyn.  Vesta Mixer, Montez Hageman., MD, [facility] 04/25/2012, 2:33 PM

## 2012-04-25 NOTE — Progress Notes (Signed)
DOFETILIDE Joice Lofts) INITIATION Pharmacy Monitoring  Admit Complaint:  31 YOM atrial fibrillation admitted for CCV with TEE, to be initiated on dofetilide.   Assessment:  Baseline QTc = 409 ms, Mg = 1.9 on 7/11, new Mg level pending, K = 4.4, Patient is on digoxin 0.25mg  daily prior to admission, last dose was on 7/11, now on hold, scr 0.83, stable, est. crcl > 100. Patient has been appropriately anticoagulated with heparin/coumadin  Goal of Therapy:  Follow renal function, electrolytes, potential drug interactions, and dose adjustment. Provide education and 1 week supply at discharge.  Plan:  - Initiate Tikosyn 500mg  PO Q12hrs as ordered. - Follow up QTc after the first 5 doses, renal function, electrolytes (K & Mg) daily x 3 days, dose adjustment  Bayard Hugger, PharmD, BCPS  Clinical Pharmacist  Pager: [phone]

## 2012-04-26 LAB — GLUCOSE, CAPILLARY
Glucose-Capillary: 97 mg/dL (ref 70–99)
Glucose-Capillary: 92 mg/dL (ref 70–99)

## 2012-04-26 LAB — BASIC METABOLIC PANEL
CO2: 30 mEq/L (ref 19–32)
Chloride: 96 mEq/L (ref 96–112)
Glucose, Bld: 96 mg/dL (ref 70–99)
Potassium: 4.6 mEq/L (ref 3.5–5.1)
Sodium: 135 mEq/L (ref 135–145)

## 2012-04-26 LAB — CBC
HCT: 43.2 % (ref 39.0–52.0)
Hemoglobin: 14.7 g/dL (ref 13.0–17.0)
MCH: 32.2 pg (ref 26.0–34.0)
MCHC: 34 g/dL (ref 30.0–36.0)
MCV: 94.7 fL (ref 78.0–100.0)
Platelets: 185 10*3/uL (ref 150–400)
RBC: 4.56 MIL/uL (ref 4.22–5.81)
RDW: 13 % (ref 11.5–15.5)
WBC: 9.9 10*3/uL (ref 4.0–10.5)

## 2012-04-26 LAB — PROTIME-INR: INR: 1.89 — ABNORMAL HIGH (ref 0.00–1.49)

## 2012-04-26 LAB — HEPARIN LEVEL (UNFRACTIONATED): Heparin Unfractionated: 0.54 IU/mL (ref 0.30–0.70)

## 2012-04-26 LAB — MAGNESIUM: Magnesium: 2 mg/dL (ref 1.5–2.5)

## 2012-04-26 MED ORDER — WARFARIN SODIUM 5 MG PO TABS
5.0000 mg | ORAL_TABLET | Freq: Once | ORAL | Status: AC
  Administered 2012-04-26: 5 mg via ORAL
  Filled 2012-04-26: qty 1

## 2012-04-26 NOTE — Anesthesia Postprocedure Evaluation (Signed)
  Anesthesia Post-op Note  Patient: [name]  Procedure(s) Performed: Procedure(s) (LRB): CARDIOVERSION (N/A)  Patient Location: Nursing Unit  Anesthesia Type: General  Level of Consciousness: awake, alert , oriented and patient cooperative  Airway and Oxygen Therapy: Patient Spontanous Breathing  Post-op Pain: none  Post-op Assessment: Post-op Vital signs reviewed, Patient's Cardiovascular Status Stable, Respiratory Function Stable, Patent Airway, No signs of Nausea or vomiting and Adequate PO intake  Post-op Vital Signs: Reviewed and stable  Complications: No apparent anesthesia complications

## 2012-04-26 NOTE — Progress Notes (Signed)
Patient ID: BYRAN [name], male   DOB: 03-11-1956, 56 y.o.   MRN: [id number]  Patient Name: [name]      SUBJECTIVE: Feels good wants BS checks stopped  Past Medical History  Diagnosis Date  . ALLERGIC RHINITIS   . ANXIETY   . ASTHMA   . Chronic pain syndrome   . BENIGN PROSTATIC HYPERTROPHY   . DEPRESSION   . DIABETES MELLITUS, TYPE II   . DYSKINESIA, ESOPHAGUS   . FIBROMYALGIA   . GERD   . HYPERLIPIDEMIA   . HYPERTENSION   . INSOMNIA   . LOW BACK PAIN   . Morbid obesity     Anticipated LAP-BAND surgery  . NEPHROLITHIASIS, HX OF   . OBSESSIVE-COMPULSIVE DISORDER   . ORCHITIS   . PERIPHERAL EDEMA   . PLANTAR FASCIITIS, BILATERAL   . POLYARTHRALGIA   . Rash and other nonspecific skin eruption 04/12/2009  . SLEEP APNEA, OBSTRUCTIVE   . Past myocardial infarction 04/19/2011  . Atrial fibrillation     Initially identified 2001, recurring 2013  . Cardiomyopathy     Myoview with prior infarct- 2012; repeat pending  . Syncope     Associated with atrial fibrillation 2001  . H/O hiatal hernia   . Myocardial infarction     hx of MI    PHYSICAL EXAM Filed Vitals:   04/25/12 2115 04/25/12 2236 04/26/12 0523 04/26/12 0927  BP: 102/59 102/59 113/70   Pulse: 58 55 63   Temp: 97.6 F (36.4 C)  97.8 F (36.6 C)   TempSrc: Oral  Oral   Resp: 20  20   Height:      Weight:      SpO2: 92%  91% 88%    Well developed and nourished in no acute distress HENT normal Neck supple with JVP-flat Clear Irregular rate and rhythm,  With RVR no murmurs or gallops Abd-soft with active BS No Clubbing cyanosis  Min edema edema Skin-warm and dry A & Oriented  Grossly normal sensory and motor function   TELEMETRY: Reviewed telemetry NSR post [facility] 04/26/2012   1/O  Not measured  Intake/Output Summary (Last 24 hours) at 04/26/12 1034 Last data filed at 04/25/12 1500  Gross per 24 hour  Intake    500 ml  Output      0 ml  Net    500 ml    LABS: Basic Metabolic  Panel:  Lab 04/26/12 0700 04/25/12 1800 04/25/12 0650 04/25/12 0035 04/24/12 1544 04/24/12 1434 04/22/12 1709  NA 135 139 139 137 140 140 138  K 4.6 5.1 4.4 4.2 4.5 4.8 3.8  CL 96 98 96 96 102 103 100  CO2 30 29 32 29 27 28 30   GLUCOSE 96 97 89 97 95 88 100*  BUN 19 16 13 13 13 14 16   CREATININE 0.96 1.02 0.83 0.84 0.78 0.77 0.8  CALCIUM 9.5 9.7 -- -- -- -- --  MG 2.0 2.0 -- -- -- -- --  PHOS -- -- -- -- -- -- --   Cardiac Enzymes: No results found for this basename: CKTOTAL:3,CKMB:3,CKMBINDEX:3,TROPONINI:3 in the last 72 hours CBC:  Lab 04/26/12 0700 04/25/12 0650 04/25/12 0035 04/24/12 1544  WBC 9.9 12.5* 10.1 9.2  NEUTROABS -- -- -- --  HGB 14.7 17.2* 16.2 14.7  HCT 43.2 49.9 46.8 43.0  MCV 94.7 94.0 93.2 93.7  PLT 185 234 210 184   PROTIME:  Basename 04/26/12 0700 04/25/12 0650 04/25/12 0035  LABPROT 22.0* 16.8*  17.0*  INR 1.89* 1.34 1.36      ASSESSMENT AND PLAN:  S/P TEE/DCC  Tolerating tikosyn with QT less than 500 On heparin as INR sub RX   Continue coumadin.  Likely D/C late Sunday or Monday am depending on INR and QT DM:  On glucophage D/C frequent BS checks    Signed, Charlton Haws MD  04/26/2012

## 2012-04-26 NOTE — Progress Notes (Signed)
ANTICOAGULATION CONSULT NOTE - Initial Consult  Pharmacy Consult for Coumadin and Heparin  Indication: atrial fibrillation   MAssessment: 56 YOM admitted 04/24/12 for Tikosyn initiation, on coumadin and heparin, INR (1.89) subtherapeutic, trending up, heparin level therapeutic.  s/p DCCV yesterday.  Hgb/Plt wnl, CrCl >100. No bleeding per chart. Currently on tikosyn initiation Mg 2.0, K 4.6, QTc 476 msc. Likely d/c home tomorrow if QT < 500 and INR therapeutic.  Home coumadin dose of 2.5mg  on Monday and Friday and 5mg  all other days; per MD's note patient has not missed any doses).   Goal of Therapy:  INR 2-3 Heparin level 0.3-0.7 units/ml Monitor platelets by anticoagulation protocol: Yes   Plan:  1. Continue heparin infusion at 1800 units/hr 2. Coumadin 5mg  po x 1 today 3. F/u daily INR, heparin level and CBC   Bayard Hugger, PharmD, BCPS  Clinical Pharmacist  Pager: [phone]  Thank you for allowing pharmacy to be part of this patients care team. 04/25/2012,11:17 AM   Allergies  Allergen Reactions  . Simvastatin     REACTION: myalgia  . Statins Other (See Comments)    myalgia   Patient Measurements: Height: 5\' 10"  (177.8 cm) Weight: 335 lb 1.6 oz (152 kg) IBW/kg (Calculated) : 73  Heparin Dosing Weight: 109.5 kg  Vital Signs: Temp: 97.8 F (36.6 C) (07/13 0523) Temp src: Oral (07/13 0523) BP: 113/70 mmHg (07/13 0523) Pulse Rate: 63  (07/13 0523)  Labs:  Basename 04/26/12 0700 04/25/12 1800 04/25/12 1014 04/25/12 0650 04/25/12 0035  HGB 14.7 -- -- 17.2* --  HCT 43.2 -- -- 49.9 46.8  PLT 185 -- -- 234 210  APTT -- -- -- -- --  LABPROT 22.0* -- -- 16.8* 17.0*  INR 1.89* -- -- 1.34 1.36  HEPARINUNFRC 0.54 0.62 <0.10* -- --  CREATININE 0.96 1.02 -- 0.83 --  CKTOTAL -- -- -- -- --  CKMB -- -- -- -- --  TROPONINI -- -- -- -- --   Estimated Creatinine Clearance: 125.6 ml/min (by C-G formula based on Cr of 0.96).

## 2012-04-27 LAB — CBC
HCT: 42.7 % (ref 39.0–52.0)
Hemoglobin: 14.3 g/dL (ref 13.0–17.0)
MCV: 94.5 fL (ref 78.0–100.0)
RDW: 12.8 % (ref 11.5–15.5)
WBC: 7.9 10*3/uL (ref 4.0–10.5)

## 2012-04-27 LAB — HEPARIN LEVEL (UNFRACTIONATED): Heparin Unfractionated: 0.76 IU/mL — ABNORMAL HIGH (ref 0.30–0.70)

## 2012-04-27 LAB — BASIC METABOLIC PANEL
BUN: 17 mg/dL (ref 6–23)
Chloride: 100 mEq/L (ref 96–112)
Creatinine, Ser: 0.85 mg/dL (ref 0.50–1.35)
GFR calc Af Amer: >90 mL/min (ref >90)

## 2012-04-27 MED ORDER — HEPARIN (PORCINE) IN NACL 100-0.45 UNIT/ML-% IJ SOLN
1600.0000 [IU]/h | INTRAMUSCULAR | Status: DC
  Administered 2012-04-27: 1600 [IU]/h via INTRAVENOUS
  Administered 2012-04-27: 1700 [IU]/h via INTRAVENOUS
  Administered 2012-04-27: 1600 [IU]/h via INTRAVENOUS
  Filled 2012-04-27: qty 250

## 2012-04-27 MED ORDER — POLYETHYLENE GLYCOL 3350 17 G PO PACK
17.0000 g | PACK | Freq: Every day | ORAL | Status: DC
  Administered 2012-04-27: 17 g via ORAL
  Filled 2012-04-27 (×2): qty 1

## 2012-04-27 MED ORDER — BISACODYL 10 MG RE SUPP
10.0000 mg | Freq: Once | RECTAL | Status: AC
  Administered 2012-04-27: 10 mg via RECTAL
  Filled 2012-04-27: qty 1

## 2012-04-27 MED ORDER — WARFARIN SODIUM 5 MG PO TABS
5.0000 mg | ORAL_TABLET | Freq: Once | ORAL | Status: AC
  Administered 2012-04-27: 5 mg via ORAL
  Filled 2012-04-27: qty 1

## 2012-04-27 NOTE — Progress Notes (Signed)
ANTICOAGULATION CONSULT NOTE - Follow-up  Pharmacy Consult for Coumadin and Heparin  Indication: atrial fibrillation  MAssessment: 56 YOM admitted 04/24/12 for Tikosyn initiation, on coumadin and heparin, INR now therapeutic at 2.24. Heparin level is slightly high today at 0.75, no bleeding noted, CBC is stable.  s/p DCCV yesterday. Currently on tikosyn initiation Mg 2.0, K 4.3, last QTc 441 msc. CrCl >100  Home coumadin dose of 2.5mg  on Monday and Friday and 5mg  all other days; per MD's note patient has not missed any doses).   Goal of Therapy:  INR 2-3 Heparin level 0.3-0.7 units/ml Monitor platelets by anticoagulation protocol: Yes   Plan:  1. Decrease heparin gtt to 1700units/hr - Consider DC heparin drip as INR is therapeutic 2. Coumadin 5mg  po x 1 today 3. F/u daily INR, heparin level and CBC 4. If heparin not dc'd, will check an 8 hour heparin level  Lysle Pearl, PharmD, BCPS Pager # [phone] 04/27/2012 7:19 AM   Allergies  Allergen Reactions  . Simvastatin     REACTION: myalgia  . Statins Other (See Comments)    myalgia   Patient Measurements: Height: 5\' 10"  (177.8 cm) Weight: 335 lb 1.6 oz (152 kg) IBW/kg (Calculated) : 73  Heparin Dosing Weight: 109.5 kg  Vital Signs: Temp: 98.7 F (37.1 C) (07/14 0544) Temp src: Oral (07/14 0544) BP: 97/65 mmHg (07/14 0544) Pulse Rate: 70  (07/14 0544)  Labs:  Basename 04/27/12 0440 04/26/12 0700 04/25/12 1800 04/25/12 0650  HGB 14.3 14.7 -- --  HCT 42.7 43.2 -- 49.9  PLT 174 185 -- 234  APTT -- -- -- --  LABPROT 25.2* 22.0* -- 16.8*  INR 2.24* 1.89* -- 1.34  HEPARINUNFRC 0.75* 0.54 0.62 --  CREATININE 0.85 0.96 1.02 --  CKTOTAL -- -- -- --  CKMB -- -- -- --  TROPONINI -- -- -- --   Estimated Creatinine Clearance: 141.9 ml/min (by C-G formula based on Cr of 0.85).

## 2012-04-27 NOTE — Progress Notes (Signed)
Patient ID: [name], male   DOB: 06-23-1956, 56 y.o.   MRN: [id number]   SUBJECTIVE: Patient Stable. Tolerating Tikosyn. Telemetry reveals sinus rhythm. INR is therapeutic.  Filed Vitals:   04/26/12 0927 04/26/12 1435 04/26/12 2035 04/27/12 0544  BP:  105/68 118/70 97/65  Pulse:  58 58 70  Temp:  97.9 F (36.6 C) 98 F (36.7 C) 98.7 F (37.1 C)  TempSrc:  Oral Oral Oral  Resp:  18 20 20   Height:      Weight:      SpO2: 88% 97% 92% 90%    Intake/Output Summary (Last 24 hours) at 04/27/12 0958 Last data filed at 04/27/12 0755  Gross per 24 hour  Intake   1200 ml  Output      0 ml  Net   1200 ml    LABS: Basic Metabolic Panel:  Basename 04/27/12 0440 04/26/12 0700  NA 140 135  K 4.3 4.6  CL 100 96  CO2 30 30  GLUCOSE 130* 96  BUN 17 19  CREATININE 0.85 0.96  CALCIUM 9.4 9.5  MG 2.0 2.0  PHOS -- --   Liver Function Tests: No results found for this basename: AST:2,ALT:2,ALKPHOS:2,BILITOT:2,PROT:2,ALBUMIN:2 in the last 72 hours No results found for this basename: LIPASE:2,AMYLASE:2 in the last 72 hours CBC:  Basename 04/27/12 0440 04/26/12 0700  WBC 7.9 9.9  NEUTROABS -- --  HGB 14.3 14.7  HCT 42.7 43.2  MCV 94.5 94.7  PLT 174 185   Cardiac Enzymes: No results found for this basename: CKTOTAL:3,CKMB:3,CKMBINDEX:3,TROPONINI:3 in the last 72 hours BNP: No components found with this basename: POCBNP:3 D-Dimer: No results found for this basename: DDIMER:2 in the last 72 hours Hemoglobin A1C: No results found for this basename: HGBA1C in the last 72 hours Fasting Lipid Panel: No results found for this basename: CHOL,HDL,LDLCALC,TRIG,CHOLHDL,LDLDIRECT in the last 72 hours Thyroid Function Tests: No results found for this basename: TSH,T4TOTAL,FREET3,T3FREE,THYROIDAB in the last 72 hours  RADIOLOGY: No results found.  PHYSICAL EXAM Patient is oriented to person time and place. Affect is normal. Lungs are clear. Respiratory effort is nonlabored. Cardiac  exam reveals S1 and S2.   TELEMETRY:  I have reviewed telemetry April 27, 2012. There is sinus rhythm.  ASSESSMENT AND PLAN   Atrial fibrillation    Patient was cardioverted to sinus rhythm. He is stable. He is maintaining sinus. He is tolerating Tikosyn. His QT interval is remaining stable. He will receive further Tikosyn doses today.   Chronic diastolic heart failure  Constipation  I will give him MiraLAX today.  Anticoagulation  Patient is therapeutic on Coumadin. I would like to wait until tomorrow to stop his heparin.   Willa Rough 04/27/2012 9:58 AM

## 2012-04-27 NOTE — Progress Notes (Addendum)
ANTICOAGULATION CONSULT NOTE - Follow-up  Pharmacy Consult for Coumadin and Heparin  Indication: atrial fibrillation  MAssessment: [name] admitted 04/24/12 for Tikosyn initiation, on coumadin and heparin, INR now therapeutic at 2.24. Heparin level is still slightly supratherapeutic at 0.76 post rate decrease this am. no bleeding noted (but rate change delayed for about 3 - 4 hrs per RN), CBC is stable. Per Dr. Myrtis Ser note, will wait until tomorrow to stop heparin.  Home coumadin dose of 2.5mg  on Monday and Friday and 5mg  all other days; per MD's note patient has not missed any doses).   Goal of Therapy:  INR 2-3 Heparin level 0.3-0.7 units/ml Monitor platelets by anticoagulation protocol: Yes   Plan:  1. Decrease heparin gtt to 1600 units/hr 2. will check an 8 hour heparin level  Bayard Hugger, PharmD, BCPS  Clinical Pharmacist  Pager: [phone]  04/27/2012 4:30 PM   Allergies  Allergen Reactions  . Simvastatin     REACTION: myalgia  . Statins Other (See Comments)    myalgia   Patient Measurements: Height: 5\' 10"  (177.8 cm) Weight: 335 lb 1.6 oz (152 kg) IBW/kg (Calculated) : 73  Heparin Dosing Weight: 109.5 kg  Vital Signs: Temp: 97.3 F (36.3 C) (07/14 1339) Temp src: Oral (07/14 1339) BP: 107/81 mmHg (07/14 1339) Pulse Rate: 58  (07/14 1339)  Labs:  Basename 04/27/12 1530 04/27/12 0440 04/26/12 0700 04/25/12 1800 04/25/12 0650  HGB -- 14.3 14.7 -- --  HCT -- 42.7 43.2 -- 49.9  PLT -- 174 185 -- 234  APTT -- -- -- -- --  LABPROT -- 25.2* 22.0* -- 16.8*  INR -- 2.24* 1.89* -- 1.34  HEPARINUNFRC 0.76* 0.75* 0.54 -- --  CREATININE -- 0.85 0.96 1.02 --  CKTOTAL -- -- -- -- --  CKMB -- -- -- -- --  TROPONINI -- -- -- -- --   Estimated Creatinine Clearance: 141.9 ml/min (by C-G formula based on Cr of 0.85).

## 2012-04-28 ENCOUNTER — Encounter (HOSPITAL_COMMUNITY): Payer: Self-pay | Admitting: Cardiovascular Disease

## 2012-04-28 LAB — BASIC METABOLIC PANEL
Calcium: 9.4 mg/dL (ref 8.4–10.5)
GFR calc non Af Amer: >90 mL/min (ref >90)
Glucose, Bld: 112 mg/dL — ABNORMAL HIGH (ref 70–99)
Sodium: 138 mEq/L (ref 135–145)

## 2012-04-28 LAB — CBC
MCH: 31.8 pg (ref 26.0–34.0)
Platelets: 180 10*3/uL (ref 150–400)
RBC: 4.43 MIL/uL (ref 4.22–5.81)

## 2012-04-28 LAB — PROTIME-INR: Prothrombin Time: 24.8 seconds — ABNORMAL HIGH (ref 11.6–15.2)

## 2012-04-28 LAB — HEPARIN LEVEL (UNFRACTIONATED): Heparin Unfractionated: 0.48 IU/mL (ref 0.30–0.70)

## 2012-04-28 MED ORDER — LISINOPRIL 2.5 MG PO TABS: 2.5000 mg | ORAL_TABLET | Freq: Every day | ORAL | Status: DC

## 2012-04-28 MED ORDER — CARVEDILOL 3.125 MG PO TABS: 3.1250 mg | ORAL_TABLET | Freq: Two times a day (BID) | ORAL | Status: DC

## 2012-04-28 MED ORDER — CARVEDILOL 3.125 MG PO TABS
3.1250 mg | ORAL_TABLET | Freq: Two times a day (BID) | ORAL | Status: DC
  Filled 2012-04-28: qty 1

## 2012-04-28 MED ORDER — DOFETILIDE 500 MCG PO CAPS: 500.0000 ug | ORAL_CAPSULE | Freq: Two times a day (BID) | ORAL | Status: DC

## 2012-04-28 NOTE — Discharge Summary (Signed)
ELECTROPHYSIOLOGY DISCHARGE SUMMARY    Patient ID: [name],  MRN: [id number], DOB/AGE: 1955/12/12 57 y.o.  Admit date: 04/24/2012 Discharge date: 04/28/2012  Primary Care Physician: Zachary Barre, MD Primary Cardiologist: Zachary Manges, MD  Primary Discharge Diagnosis:  1. Symptomatic AF s/p Tikosyn drug loading and TEE-guided DCCV  Secondary Discharge Diagnoses:  1. LV dysfunction, EF 20-25% 2. Abnormal nuclear stress test July 2012 - Small inferior wall infarct involving also the apex with depressed left ventricular function and an ejection fraction of 44%. No ischemia. 3. Hypertension 4. Dyslipidemia 5. Morbid obesity 6. OSA 7. DM 8. Chronic pain syndrome 9. Asthma 10. OCD 11. Depression  Procedures This Admission:  1. TEE-guided DCCV with anesthesia and intubation for airway protection (obese, severe OSA and history of respiratory arrest in dentist office with sedation). His TEE showed LVEF 20-25% and no LA/LAA clot. He then was successfully cardioverted to SR. Please see procedure report for full details.   History and Hospital Course:  [name] is a 56 year old gentleman with symptomatic AF with worsening exercise tolerance x 3-4 months with fatigue, SOB and lightheadedness. He previously underwent DCCV and held sinus rhythm for about 24 hrs. He reports he felt significantly better in SR; therefore, when he was seen in follow-up last week he elected rhythm control treatment approach. He was admitted on 04/24/2012 for AAD drug loading with Tikosyn in hopes of maintaining SR. He started Tikosyn per protocol with daily ECGs. His INR was subtherapeutic and he was started on IV heparin with pharmacy assistance. On 04/25/2012, he underwent TEE-guided DCCV with anesthesia and intubation for airway protection (obese, severe OSA and history of respiratory arrest in dentist office with sedation). His TEE showed LVEF 20-25% and no LA/LAA clot. He then was successfully cardioverted to SR.  He continued Tikosyn. His QTc remained stable. His INR became therapeutic on 04/27/2012 and he was continued on IV heparin for additional 24 hours. His INR today is 2.2 and IV heparin has been discontinued. He remains in SR. Due to his LV dysfunction, his metoprolol tartrate was discontinued and he was started on low dose carvedilol. Low dose ACEI therapy was added as well and his digoxin was continued. He will follow-up as an outpatient in 2 weeks for reassessment with BP check, 12-lead ECG and labs (BMET). He has been seen, examined and deemed stable for discharge today by Dr. Sherryl [name].   Discharge Vitals: Blood pressure 98/54, pulse 64, temperature 97.7 F (36.5 C), temperature source Oral, resp. rate 18, height 5\' 10"  (1.778 m), weight 335 lb 1.6 oz (152 kg), SpO2 92%.   Labs: Lab Results  Component Value Date   WBC 7.0 04/28/2012   HGB 14.1 04/28/2012   HCT 41.9 04/28/2012   MCV 94.6 04/28/2012   PLT 180 04/28/2012     Lab 04/28/12 0002  NA 138  K 4.0  CL 100  CO2 29  BUN 16  CREATININE 0.80  CALCIUM 9.4  PROT --  BILITOT --  ALKPHOS --  ALT --  AST --  GLUCOSE 112*    Basename 04/28/12 0002  INR 2.20*    Disposition:  The patient is being discharged in stable condition.  Follow-up: Follow-up Information    Follow up with Zachary Newcomer, PA on 05/12/2012. (At 10:10 AM - hospital follow-up s/p Tikosyn initiation (labs and ECG))    Contact information:   [facility] [address] [place] [address] [phone]  Follow up with [name] on 05/01/2012. (At 9:30 AM for Coumadin follow-up (INR check))    Contact information:   [facility] [address] [place] [address]   Discharge Medications:  Medication List  As of 04/28/2012 10:56 AM     STOP taking these medications     metoprolol 50 MG tablet (changed to carvedilol - see below)       TAKE these  medications     albuterol 108 (90 BASE) MCG/ACT inhaler   Commonly known as: PROVENTIL HFA;VENTOLIN HFA   Inhale 2 puffs into the lungs every 4 (four) hours as needed. For wheezing      ALPRAZolam 1 MG 24 hr tablet   Commonly known as: XANAX XR   Take 1 mg by mouth at bedtime as needed. For anxiety, for sleep      carisoprodol 350 MG tablet   Commonly known as: SOMA   Take 350 mg by mouth 3 (three) times daily as needed. For muscle spasms      carvedilol 3.125 MG tablet   Commonly known as: COREG   Take 1 tablet (3.125 mg total) by mouth 2 (two) times daily with a meal.      clobetasol cream 0.05 %   Commonly known as: TEMOVATE   Apply 1 application topically daily.      digoxin 0.25 MG tablet   Commonly known as: LANOXIN   Take 0.25 mg by mouth daily.      dofetilide 500 MCG capsule   Commonly known as: TIKOSYN   Take 1 capsule (500 mcg total) by mouth every 12 (twelve) hours.      doxepin 25 MG capsule   Commonly known as: SINEQUAN   Take 25 mg by mouth 2 (two) times daily.      Fluticasone-Salmeterol 250-50 MCG/DOSE Aepb   Commonly known as: ADVAIR   Inhale 1 puff into the lungs daily.      furosemide 40 MG tablet   Commonly known as: LASIX   Take 40 mg by mouth daily.      hydrocortisone 2.5 % cream   Apply 1 application topically 2 (two) times daily. As needed to facial areas only      ibuprofen 800 MG tablet   Commonly known as: ADVIL,MOTRIN   Take 800 mg by mouth 2 (two) times daily as needed. For pain      lisinopril 2.5 MG tablet   Commonly known as: PRINIVIL,ZESTRIL   Take 1 tablet (2.5 mg total) by mouth daily.      metFORMIN 500 MG tablet   Commonly known as: GLUCOPHAGE   Take 500 mg by mouth 2 (two) times daily with a meal.      omeprazole 20 MG capsule   Commonly known as: PRILOSEC   Take 20 mg by mouth 2 (two) times daily.      potassium chloride SA 20 MEQ tablet   Commonly known as: K-DUR,KLOR-CON   Take 2 tablets (40 mEq total) by mouth  as directed.      sertraline 100 MG tablet   Commonly known as: ZOLOFT   Take 200 mg by mouth daily.      traZODone 100 MG tablet   Commonly known as: DESYREL   Take 50 mg by mouth at bedtime.      warfarin 5 MG tablet   Commonly known as: COUMADIN   Take 2.5-5 mg by mouth daily. Take 2.5mg  on Mon & Fri &  5mg  on all other days      zolpidem 10 MG tablet   Commonly known as: AMBIEN   Take 10 mg by mouth at bedtime as needed. For sleep      Duration of Discharge Encounter: Greater than 30 minutes including physician time.  Signed, Rick Duff, PA-C 04/28/2012, 10:56 AM

## 2012-04-28 NOTE — Progress Notes (Signed)
Patient Name: [name]      SUBJECTIVE: feeling much better in sinus  Past Medical History  Diagnosis Date  . ALLERGIC RHINITIS   . ANXIETY   . ASTHMA   . Chronic pain syndrome   . BENIGN PROSTATIC HYPERTROPHY   . DEPRESSION   . DIABETES MELLITUS, TYPE II   . DYSKINESIA, ESOPHAGUS   . FIBROMYALGIA   . GERD   . HYPERLIPIDEMIA   . HYPERTENSION   . INSOMNIA   . LOW BACK PAIN   . Morbid obesity     Anticipated LAP-BAND surgery  . NEPHROLITHIASIS, HX OF   . OBSESSIVE-COMPULSIVE DISORDER   . ORCHITIS   . PERIPHERAL EDEMA   . PLANTAR FASCIITIS, BILATERAL   . POLYARTHRALGIA   . Rash and other nonspecific skin eruption 04/12/2009  . SLEEP APNEA, OBSTRUCTIVE   . Past myocardial infarction 04/19/2011  . Atrial fibrillation     Initially identified 2001, recurring 2013  . Cardiomyopathy     Myoview with prior infarct- 2012; repeat pending  . Syncope     Associated with atrial fibrillation 2001  . H/O hiatal hernia   . Myocardial infarction     hx of MI    PHYSICAL EXAM Filed Vitals:   04/27/12 1339 04/27/12 1529 04/27/12 1926 04/28/12 0432  BP: 107/81  102/60 98/54  Pulse: 58  59 64  Temp: 97.3 F (36.3 C)  98.3 F (36.8 C) 97.7 F (36.5 C)  TempSrc: Oral  Oral Oral  Resp: 20  20 18   Height:      Weight:      SpO2: 92% 94% 93% 92%   Well developed and nourished boese in no acute distress HENT normal Neck supple  Clear Regular rate and rhythm, no murmurs or gallops Abd-soft with active BS No Clubbing cyanosis no edema Skin-warm and dry A & Oriented  Grossly normal sensory and motor function  TELEMETRY: Reviewed telemetry pt in  NSR :  ECG QT 467  O/w normal  Intake/Output Summary (Last 24 hours) at 04/28/12 0835 Last data filed at 04/27/12 1342  Gross per 24 hour  Intake    120 ml  Output      0 ml  Net    120 ml    LABS: Basic Metabolic Panel:  Lab 04/28/12 1610 04/27/12 0440 04/26/12 0700 04/25/12 1800 04/25/12 0650 04/25/12 0035 04/24/12  1544  NA 138 140 135 139 139 137 140  K 4.0 4.3 4.6 5.1 4.4 4.2 4.5  CL 100 100 96 98 96 96 102  CO2 29 30 30 29  32 29 27  GLUCOSE 112* 130* 96 97 89 97 95  BUN 16 17 19 16 13 13 13   CREATININE 0.80 0.85 0.96 1.02 0.83 0.84 0.78  CALCIUM 9.4 9.4 -- -- -- -- --  MG -- 2.0 2.0 -- -- -- --  PHOS -- -- -- -- -- -- --   Cardiac Enzymes: No results found for this basename: CKTOTAL:3,CKMB:3,CKMBINDEX:3,TROPONINI:3 in the last 72 hours CBC:  Lab 04/28/12 0002 04/27/12 0440 04/26/12 0700 04/25/12 0650 04/25/12 0035 04/24/12 1544  WBC 7.0 7.9 9.9 12.5* 10.1 9.2  NEUTROABS -- -- -- -- -- --  HGB 14.1 14.3 14.7 17.2* 16.2 14.7  HCT 41.9 42.7 43.2 49.9 46.8 43.0  MCV 94.6 94.5 94.7 94.0 93.2 93.7  PLT 180 174 185 234 210 184   PROTIME:  Basename 04/28/12 0002 04/27/12 0440 04/26/12 0700  LABPROT 24.8* 25.2* 22.0*  INR 2.20*  2.24* 1.89*     ASSESSMENT AND PLAN:  Patient Active Hospital Problem List: Atrial fibrillation ()  Chronic diastolic heart failure (04/15/2012)  DIABETES MELLITUS, TYPE II (06/09/2007)  Morbid obesity (06/03/2007)  SLEEP APNEA, OBSTRUCTIVE (10/21/2007) **  Much improved Will ambutate to see if qulaifies for oxygen Discharge today  Signed, Sherryl Manges MD  04/28/2012

## 2012-04-28 NOTE — Anesthesia Postprocedure Evaluation (Signed)
  Anesthesia Post-op Note  Patient: [name]  Procedure(s) Performed: Procedure(s) (LRB): CARDIOVERSION (N/A)  Patient Location: PACU  Anesthesia Type: General  Level of Consciousness: awake, alert , oriented and patient cooperative  Airway and Oxygen Therapy: Patient Spontanous Breathing and Patient connected to nasal cannula oxygen  Post-op Pain: none  Post-op Assessment: Post-op Vital signs reviewed, Patient's Cardiovascular Status Stable, Respiratory Function Stable, Patent Airway and No signs of Nausea or vomiting  Post-op Vital Signs: stable  Complications: No apparent anesthesia complications

## 2012-04-28 NOTE — Progress Notes (Signed)
ANTICOAGULATION CONSULT NOTE - Follow-up  Pharmacy Consult for Heparin  Indication: atrial fibrillation  MAssessment: 56 YOM admitted 04/24/12 for Tikosyn initiation, on coumadin and heparin, INR therapeutic at 2.24. Heparin level therapeutic. No bleeding noted. CBC is stable. Per Dr. Myrtis Ser note, will wait until today a.m. to stop heparin.  Home coumadin dose of 2.5mg  on Monday and Friday and 5mg  all other days; per MD's note patient has not missed any doses).   Goal of Therapy:  Heparin level 0.3-0.7 units/ml Monitor platelets by anticoagulation protocol: Yes   Plan:  1. Continuee heparin gtt at 1600 units/hr 2. F/u a.m. level and probable d/c today  Christoper Fabian, PharmD, BCPS Clinical pharmacist, pager [phone] 04/28/2012 12:53 AM   Allergies  Allergen Reactions  . Simvastatin     REACTION: myalgia  . Statins Other (See Comments)    myalgia   Patient Measurements: Height: 5\' 10"  (177.8 cm) Weight: 335 lb 1.6 oz (152 kg) IBW/kg (Calculated) : 73  Heparin Dosing Weight: 109.5 kg  Vital Signs: Temp: 98.3 F (36.8 C) (07/14 1926) Temp src: Oral (07/14 1926) BP: 102/60 mmHg (07/14 1926) Pulse Rate: 59  (07/14 1926)  Labs:  Basename 04/28/12 0002 04/27/12 1530 04/27/12 0440 04/26/12 0700 04/25/12 1800  HGB 14.1 -- 14.3 -- --  HCT 41.9 -- 42.7 43.2 --  PLT 180 -- 174 185 --  APTT -- -- -- -- --  LABPROT 24.8* -- 25.2* 22.0* --  INR 2.20* -- 2.24* 1.89* --  HEPARINUNFRC 0.48 0.76* 0.75* -- --  CREATININE -- -- 0.85 0.96 1.02  CKTOTAL -- -- -- -- --  CKMB -- -- -- -- --  TROPONINI -- -- -- -- --   Estimated Creatinine Clearance: 141.9 ml/min (by C-G formula based on Cr of 0.85).

## 2012-04-29 ENCOUNTER — Encounter (HOSPITAL_COMMUNITY): Payer: Self-pay | Admitting: Internal Medicine

## 2012-04-29 ENCOUNTER — Ambulatory Visit (HOSPITAL_COMMUNITY): Admission: RE | Admit: 2012-04-29 | Payer: Medicare HMO | Source: Ambulatory Visit | Admitting: General Surgery

## 2012-04-29 ENCOUNTER — Encounter (HOSPITAL_COMMUNITY): Admission: RE | Payer: Self-pay | Source: Ambulatory Visit

## 2012-04-29 SURGERY — GASTRIC BANDING, LAPAROSCOPIC
Anesthesia: General

## 2012-04-29 NOTE — Telephone Encounter (Signed)
Surgery schedulers& Dr. Johna Sheriff both have been notified that Mr. Bonawitz not cleared for surgery at this time.

## 2012-04-30 ENCOUNTER — Telehealth: Payer: Self-pay | Admitting: Internal Medicine

## 2012-04-30 DIAGNOSIS — Z79899 Other long term (current) drug therapy: Secondary | ICD-10-CM

## 2012-04-30 MED ORDER — POTASSIUM CHLORIDE CRYS ER 20 MEQ PO TBCR: 20.0000 meq | EXTENDED_RELEASE_TABLET | Freq: Every day | ORAL | Status: DC

## 2012-04-30 NOTE — Telephone Encounter (Signed)
Patient phoned in c/o increased shortness of breath since coming home from the hospital.  Patient states he just started Lisinopril and Carvedilol today secondary to not having the money to start sooner.  Patient also states he has some swelling in his feet. Discussed with [name] and will have patient increase his Lasix to 40 mg BID, stop his Ibuprofen, and start Kdur 20 meq daily.  Discharge summary stated he should have been on potassium however patient stated he was not given an Rx nor did he receive in the hospital. [name] did review chart.  Scheduled patient an appointment to see [name]. PA on Friday morning with labs.  Did advise patient if worse or no better to call back or go to Emergency dept.  Patient verbalized understanding

## 2012-04-30 NOTE — Telephone Encounter (Signed)
New msg Pt's daughter called and said that he has swelling around ankles and some sob. Please call back to discuss

## 2012-05-01 ENCOUNTER — Ambulatory Visit (INDEPENDENT_AMBULATORY_CARE_PROVIDER_SITE_OTHER): Payer: Medicare HMO | Admitting: *Deleted

## 2012-05-01 DIAGNOSIS — Z7901 Long term (current) use of anticoagulants: Secondary | ICD-10-CM

## 2012-05-01 DIAGNOSIS — I4891 Unspecified atrial fibrillation: Secondary | ICD-10-CM

## 2012-05-01 LAB — BASIC METABOLIC PANEL
CO2: 27 mEq/L (ref 19–32)
Calcium: 9 mg/dL (ref 8.4–10.5)
Creatinine, Ser: 0.8 mg/dL (ref 0.4–1.5)
Glucose, Bld: 146 mg/dL — ABNORMAL HIGH (ref 70–99)

## 2012-05-01 LAB — BRAIN NATRIURETIC PEPTIDE: Pro B Natriuretic peptide (BNP): 109 pg/mL — ABNORMAL HIGH (ref 0.0–100.0)

## 2012-05-02 ENCOUNTER — Encounter: Payer: Self-pay | Admitting: Physician Assistant

## 2012-05-02 ENCOUNTER — Inpatient Hospital Stay: Admission: AD | Admit: 2012-05-02 | Payer: Medicare HMO | Source: Ambulatory Visit | Admitting: Internal Medicine

## 2012-05-02 ENCOUNTER — Telehealth: Payer: Self-pay | Admitting: Physician Assistant

## 2012-05-02 ENCOUNTER — Ambulatory Visit (INDEPENDENT_AMBULATORY_CARE_PROVIDER_SITE_OTHER): Payer: Medicare HMO | Admitting: Physician Assistant

## 2012-05-02 VITALS — BP 114/77 | HR 64 | Ht 70.0 in | Wt 333.0 lb

## 2012-05-02 DIAGNOSIS — I429 Cardiomyopathy, unspecified: Secondary | ICD-10-CM

## 2012-05-02 DIAGNOSIS — R0600 Dyspnea, unspecified: Secondary | ICD-10-CM

## 2012-05-02 DIAGNOSIS — I4891 Unspecified atrial fibrillation: Secondary | ICD-10-CM

## 2012-05-02 DIAGNOSIS — I428 Other cardiomyopathies: Secondary | ICD-10-CM | POA: Insufficient documentation

## 2012-05-02 DIAGNOSIS — R0602 Shortness of breath: Secondary | ICD-10-CM | POA: Insufficient documentation

## 2012-05-02 DIAGNOSIS — R0609 Other forms of dyspnea: Secondary | ICD-10-CM

## 2012-05-02 DIAGNOSIS — Z79899 Other long term (current) drug therapy: Secondary | ICD-10-CM

## 2012-05-02 MED ORDER — DIGOXIN 125 MCG PO TABS: 0.1250 mg | ORAL_TABLET | Freq: Every day | ORAL | Status: DC

## 2012-05-02 MED ORDER — AMIODARONE HCL 200 MG PO TABS: 600.0000 mg | ORAL_TABLET | Freq: Three times a day (TID) | ORAL | Status: DC

## 2012-05-02 NOTE — Telephone Encounter (Signed)
New msg [facility] [address] called and said amiodarone not coverd by his insurance

## 2012-05-02 NOTE — H&P (Deleted)
History and Physical   Date:  05/02/2012   Name:  [name]   DOB:  09-16-1956   MRN:  [id number]  PCP:  [name], MD  Primary Cardiologist/Primary Electrophysiologist:  Dr. Sherryl Manges    History of Present Illness: [name] is a 56 y.o. male who returns for post hospital follow up.  He has a history of atrial fibrillation.  He was seen by Dr. Graciela Husbands earlier this month.  Nuclear study in 04/2011 demonstrated an EF of 44%, small inferior wall infarct at mid and apical level with no ischemia.   Echo 04/15/12:  Mild LVh, EF 45-50%, prob mild HK mid to dist inf wall, severe LAE, mild RVE, mild reduced RV fxn, mod RAE.  He was symptomatic with his atrial fibrillation.  He failed DCCV x 1.  Rhythm control was recommended.  With his CM and likely underlying CAD, Class 1C drugs could not be chose.  He was therefore admitted 7/11-7/15 for dofetilide load.  His INR was sub-therapeutic and he underwent TEE-DCCV.  TEE 04/24/12: EF 20-25%, trace MR, trace TR, no LA clot.  With his cardiomyopathy, metoprolol was changed to carvedilol.  Low-dose ACE was started and he was continued on digoxin.  Within 24 hours of discharge, he began to feel worse.  He called the office with worsening dyspnea.  His Lasix was increased.  He notes class for dyspnea.  He sleeps on 2 pillows.  He sleeps with a CPAP.  He's had some lower extremity edema at that may have worsened.  His weight is actually down 3 pounds.  He denies syncope.  He does describe chest pain.  It is dull.  It feels pulsatile.  He feels in his left shoulder at times as well as his back.  He does note palpitations.  He definitely tell that he was back in atrial fibrillation.  He does question whether or not he should be on home O2.  He was apparently hypoxic in the hospital.  Wt Readings from Last 3 Encounters:  05/02/12 333 lb (151.048 kg)  04/24/12 335 lb 1.6 oz (152 kg)  04/24/12 335 lb 1.6 oz (152 kg)     Potassium  Date/Time Value Range Status    05/01/2012 10:18 AM 3.9  3.5 - 5.1 mEq/L Final   Creatinine, Ser  Date/Time Value Range Status  05/01/2012 10:18 AM 0.8  0.4 - 1.5 mg/dL Final   Magnesium  Date/Time Value Range Status  05/01/2012 10:18 AM 1.9  1.5 - 2.5 mg/dL Final    Past Medical History  Diagnosis Date  . ALLERGIC RHINITIS   . ANXIETY   . ASTHMA   . Chronic pain syndrome   . BENIGN PROSTATIC HYPERTROPHY   . DEPRESSION   . DIABETES MELLITUS, TYPE II   . DYSKINESIA, ESOPHAGUS   . FIBROMYALGIA   . GERD   . HYPERLIPIDEMIA   . HYPERTENSION   . INSOMNIA   . LOW BACK PAIN   . Morbid obesity     Anticipated LAP-BAND surgery  . NEPHROLITHIASIS, HX OF   . OBSESSIVE-COMPULSIVE DISORDER   . ORCHITIS   . PERIPHERAL EDEMA   . PLANTAR FASCIITIS, BILATERAL   . POLYARTHRALGIA   . Rash and other nonspecific skin eruption 04/12/2009  . SLEEP APNEA, OBSTRUCTIVE   . Past myocardial infarction 04/19/2011  . Atrial fibrillation     Initially identified 2001, recurring 2013  . Cardiomyopathy     Myoview with prior infarct- 2012; repeat pending  .  Syncope     Associated with atrial fibrillation 2001  . H/O hiatal hernia   . Myocardial infarction     hx of MI    Current Outpatient Prescriptions  Medication Sig Dispense Refill  . albuterol (PROVENTIL HFA;VENTOLIN HFA) 108 (90 BASE) MCG/ACT inhaler Inhale 2 puffs into the lungs every 4 (four) hours as needed. For wheezing      . ALPRAZolam (XANAX XR) 1 MG 24 hr tablet Take 1 mg by mouth at bedtime as needed. For anxiety, for sleep      . carisoprodol (SOMA) 350 MG tablet Take 350 mg by mouth 3 (three) times daily as needed. For muscle spasms      . carvedilol (COREG) 3.125 MG tablet Take 1 tablet (3.125 mg total) by mouth 2 (two) times daily with a meal.  60 tablet  3  . clobetasol cream (TEMOVATE) 0.05 % Apply 1 application topically daily.       . digoxin (LANOXIN) 0.25 MG tablet Take 0.25 mg by mouth daily.      [name] dofetilide (TIKOSYN) 500 MCG capsule Take 1 capsule  (500 mcg total) by mouth every 12 (twelve) hours.  60 capsule  3  . doxepin (SINEQUAN) 25 MG capsule Take 25 mg by mouth 2 (two) times daily.      . Fluticasone-Salmeterol (ADVAIR) 250-50 MCG/DOSE AEPB Inhale 1 puff into the lungs daily.      . furosemide (LASIX) 40 MG tablet Take 40 mg by mouth 2 (two) times daily.       . hydrocortisone 2.5 % cream Apply 1 application topically 2 (two) times daily. As needed to facial areas only      . lisinopril (PRINIVIL,ZESTRIL) 2.5 MG tablet Take 1 tablet (2.5 mg total) by mouth daily.  30 tablet  3  . metFORMIN (GLUCOPHAGE) 500 MG tablet Take 500 mg by mouth 2 (two) times daily with a meal.      . omeprazole (PRILOSEC) 20 MG capsule Take 20 mg by mouth 2 (two) times daily.      . potassium chloride SA (K-DUR,KLOR-CON) 20 MEQ tablet Take 1 tablet (20 mEq total) by mouth daily.  30 tablet  3  . sertraline (ZOLOFT) 100 MG tablet Take 200 mg by mouth daily.       . traZODone (DESYREL) 100 MG tablet Take 50 mg by mouth at bedtime.      [name] warfarin (COUMADIN) 5 MG tablet Take 2.5-5 mg by mouth daily. Take 2.5mg  on Mon & Fri & 5mg  on all other days      . zolpidem (AMBIEN) 10 MG tablet Take 10 mg by mouth at bedtime as needed. For sleep      . DISCONTD: atorvastatin (LIPITOR) 20 MG tablet Take 1 tablet (20 mg total) by mouth daily.  30 tablet  11  . DISCONTD: gabapentin (NEURONTIN) 300 MG capsule Take 1 capsule (300 mg total) by mouth 3 (three) times daily.  90 capsule  5    Allergies: Allergies  Allergen Reactions  . Simvastatin     REACTION: myalgia  . Statins Other (See Comments)    myalgia    History  Substance Use Topics  . Smoking status: Former Smoker -- 2.0 packs/day for 30 years    Types: Cigarettes    Quit date: 10/16/2007  . Smokeless tobacco: Not on file  . Alcohol Use: No    Family History  Problem Relation Age of Onset  . COPD Mother   . Asthma Mother   .  Colon polyps Mother   . Allergies Mother   . Asthma Maternal Grandmother      ROS:  Please see the history of present illness.   Notes non-productive cough.  All other systems reviewed and negative.   PHYSICAL EXAM: VS:  BP 114/77  Pulse 64  Ht 5\' 10"  (1.778 m)  Wt 333 lb (151.048 kg)  BMI 47.78 kg/m2 Well nourished, well developed, in no acute distress HEENT: normal Neck: no appreciable JVD Cardiac:  normal S1, S2; irregularly irregular; no murmur Lungs:  Decreased breath sounds bilaterally, no wheezing, rhonchi or rales Abd: soft, nontender, no hepatomegaly Ext: trace bilateral LE edema Skin: warm and dry Neuro:  CNs 2-12 intact, no focal abnormalities noted  EKG:  AFib, HR 123      ASSESSMENT AND PLAN:  1.  Atrial Fibrillation He has recurrent atrial fibrillation.  His rate is uncontrolled.  He is quite symptomatic. I discussed this with Dr. Sherryl Manges and we have recommended admission to the hospital. His dofetilide will be discontinued. He will be placed on amiodarone drip. His digoxin will be decreased to 0.125 mg daily. If he does not convert to sinus rhythm over the next several days, he may require repeat DCCV.  2.  Dilated Cardiomyopathy His volume appears stable.  Continue current therapy.  3.  Probable CAD He noted some chest pain. Check serial cardiac markers.  4.  Dyspnea He will need evaluation for home O2 prior to discharge.  Luna Glasgow, PA-C  8:27 AM 05/02/2012

## 2012-05-02 NOTE — Telephone Encounter (Signed)
Verified with Dr. Graciela Husbands this is the right dose. I have made him aware of the situation and that the patient was given #30 tabs, so he will be out on Monday when he sees Dr. Graciela Husbands.

## 2012-05-02 NOTE — Progress Notes (Signed)
[address] [place], [place]  [address] Phone: [phone] Fax:  [phone]  Date:  05/02/2012   Name:  [name]   DOB:  08/17/1956   MRN:  [id number]  PCP:  [name], MD  Primary Cardiologist/Primary Electrophysiologist:  Dr. Sherryl Manges    History of Present Illness: [name] is a 56 y.o. male who returns for post hospital follow up.  He has a history of atrial fibrillation.  He was seen by Dr. Graciela Husbands earlier this month.  Nuclear study in 04/2011 demonstrated an EF of 44%, small inferior wall infarct at mid and apical level with no ischemia.   Echo 04/15/12:  Mild LVH, EF 45-50%, prob mild HK mid to dist inf wall, severe LAE, mild RVE, mild reduced RV fxn, mod RAE.  He was symptomatic with his atrial fibrillation.  He failed DCCV x 1.  Rhythm control was recommended.  With his CM and likely underlying CAD, Class 1C drugs could not be chosen.  He was therefore admitted 7/11-7/15 for dofetilide load.  His INR was sub-therapeutic and he underwent TEE-DCCV.  TEE 04/24/12: EF 20-25%, trace MR, trace TR, no LA clot.  With his cardiomyopathy, metoprolol was changed to carvedilol.  Low-dose ACE was started and he was continued on digoxin.  Within 24 hours of discharge, he began to feel worse.  He called the office with worsening dyspnea.  His Lasix was increased.  He notes class IV dyspnea.  He sleeps on 2 pillows.  He sleeps with a CPAP.  He's had some LE edema that may have worsened.  His weight is actually down 3 pounds.  He denies syncope.  He does describe chest pain.  It is dull.  It feels pulsatile.  He feels in his left shoulder at times as well as his back.  He does note palpitations.  He could definitely tell that he was back in atrial fibrillation.  He does question whether or not he should be on home O2.  He was apparently hypoxic in the hospital.  Wt Readings from Last 3 Encounters:  05/02/12 333 lb (151.048 kg)  04/24/12 335 lb 1.6 oz (152 kg)    04/24/12 335 lb 1.6 oz (152 kg)     Potassium  Date/Time Value Range Status  05/01/2012 10:18 AM 3.9  3.5 - 5.1 mEq/L Final   Creatinine, Ser  Date/Time Value Range Status  05/01/2012 10:18 AM 0.8  0.4 - 1.5 mg/dL Final   Magnesium  Date/Time Value Range Status  05/01/2012 10:18 AM 1.9  1.5 - 2.5 mg/dL Final    Past Medical History  Diagnosis Date  . ALLERGIC RHINITIS   . ANXIETY   . ASTHMA   . Chronic pain syndrome   . BENIGN PROSTATIC HYPERTROPHY   . DEPRESSION   . DIABETES MELLITUS, TYPE II   . DYSKINESIA, ESOPHAGUS   . FIBROMYALGIA   . GERD   . HYPERLIPIDEMIA   . HYPERTENSION   . INSOMNIA   . LOW BACK PAIN   . Morbid obesity     Anticipated LAP-BAND surgery  . NEPHROLITHIASIS, HX OF   . OBSESSIVE-COMPULSIVE DISORDER   . ORCHITIS   . PERIPHERAL EDEMA   . PLANTAR FASCIITIS, BILATERAL   . POLYARTHRALGIA   . Rash and other nonspecific skin eruption 04/12/2009  . SLEEP APNEA, OBSTRUCTIVE   . Atrial fibrillation     Initially identified 2001, recurring 2013  . Cardiomyopathy     Myoview with  prior infarct- 2012; repeat pending  . Syncope     Associated with atrial fibrillation 2001  . H/O hiatal hernia   . Myocardial infarction     hx of MI    Current Outpatient Prescriptions  Medication Sig Dispense Refill  . albuterol (PROVENTIL HFA;VENTOLIN HFA) 108 (90 BASE) MCG/ACT inhaler Inhale 2 puffs into the lungs every 4 (four) hours as needed. For wheezing      . ALPRAZolam (XANAX XR) 1 MG 24 hr tablet Take 1 mg by mouth at bedtime as needed. For anxiety, for sleep      . carisoprodol (SOMA) 350 MG tablet Take 350 mg by mouth 3 (three) times daily as needed. For muscle spasms      . carvedilol (COREG) 3.125 MG tablet Take 1 tablet (3.125 mg total) by mouth 2 (two) times daily with a meal.  60 tablet  3  . clobetasol cream (TEMOVATE) 0.05 % Apply 1 application topically daily.       . digoxin (LANOXIN) 0.25 MG tablet Take 0.25 mg by mouth daily.      [name] dofetilide  (TIKOSYN) 500 MCG capsule Take 1 capsule (500 mcg total) by mouth every 12 (twelve) hours.  60 capsule  3  . doxepin (SINEQUAN) 25 MG capsule Take 25 mg by mouth 2 (two) times daily.      . Fluticasone-Salmeterol (ADVAIR) 250-50 MCG/DOSE AEPB Inhale 1 puff into the lungs daily.      . furosemide (LASIX) 40 MG tablet Take 40 mg by mouth 2 (two) times daily.       . hydrocortisone 2.5 % cream Apply 1 application topically 2 (two) times daily. As needed to facial areas only      . lisinopril (PRINIVIL,ZESTRIL) 2.5 MG tablet Take 1 tablet (2.5 mg total) by mouth daily.  30 tablet  3  . metFORMIN (GLUCOPHAGE) 500 MG tablet Take 500 mg by mouth 2 (two) times daily with a meal.      . omeprazole (PRILOSEC) 20 MG capsule Take 20 mg by mouth 2 (two) times daily.      . potassium chloride SA (K-DUR,KLOR-CON) 20 MEQ tablet Take 1 tablet (20 mEq total) by mouth daily.  30 tablet  3  . sertraline (ZOLOFT) 100 MG tablet Take 200 mg by mouth daily.       . traZODone (DESYREL) 100 MG tablet Take 50 mg by mouth at bedtime.      [name] warfarin (COUMADIN) 5 MG tablet Take 2.5-5 mg by mouth daily. Take 2.5mg  on Mon & Fri & 5mg  on all other days      . zolpidem (AMBIEN) 10 MG tablet Take 10 mg by mouth at bedtime as needed. For sleep      . DISCONTD: atorvastatin (LIPITOR) 20 MG tablet Take 1 tablet (20 mg total) by mouth daily.  30 tablet  11  . DISCONTD: gabapentin (NEURONTIN) 300 MG capsule Take 1 capsule (300 mg total) by mouth 3 (three) times daily.  90 capsule  5    Allergies: Allergies  Allergen Reactions  . Simvastatin     REACTION: myalgia  . Statins Other (See Comments)    myalgia    History  Substance Use Topics  . Smoking status: Former Smoker -- 2.0 packs/day for 30 years    Types: Cigarettes    Quit date: 10/16/2007  . Smokeless tobacco: Not on file  . Alcohol Use: No    Family History  Problem Relation Age of Onset  . COPD  Mother   . Asthma Mother   . Colon polyps Mother   . Allergies  Mother   . Asthma Maternal Grandmother     ROS:  Please see the history of present illness.   Notes non-productive cough.  All other systems reviewed and negative.   PHYSICAL EXAM: VS:  BP 114/77  Pulse 64  Ht 5\' 10"  (1.778 m)  Wt 333 lb (151.048 kg)  BMI 47.78 kg/m2 Well nourished, well developed, in no acute distress HEENT: normal Neck: no appreciable JVD Cardiac:  normal S1, S2; irregularly irregular; no murmur Lungs:  Decreased breath sounds bilaterally, no wheezing, rhonchi or rales Abd: soft, nontender, no hepatomegaly Ext: trace bilateral LE edema Skin: warm and dry Neuro:  CNs 2-12 intact, no focal abnormalities noted  EKG:  AFib, HR 123      ASSESSMENT AND PLAN:  1.  Atrial Fibrillation He has recurrent atrial fibrillation.  His rate is uncontrolled.  He is quite symptomatic. I discussed this with Dr. Sherryl Manges who also saw the patient. Initially, we recommended admission to the hospital.  However, he did not want to be admitted.  And, after further review with Dr. Graciela Husbands the recommendation was to adjust medications at home with early follow up. His dofetilide will be discontinued. He will be placed on amiodarone 600 mg Q 8 hours. His digoxin will be decreased to 0.125 mg daily. His coumadin will be adjusted back to 1/2 dose on Mon and Fri.   He has follow up with Dr. Graciela Husbands next week.  He will keep this appt.    2.  Dilated Cardiomyopathy His volume appears stable.  Continue current therapy.  3.  Dyspnea He was ambulated in the office today and his O2 sats remained at 91-92%.  Luna Glasgow, PA-C  8:27 AM 05/02/2012

## 2012-05-02 NOTE — Telephone Encounter (Signed)
Spoke with Autumn at [facility] -- Insurance said they will cover 6 tabs a day if he needs 9 then the Dr must call insurance ([phone]) for the authorization. Pt picked up #30 which will last him thru Monday 1st dose, his appointment is at 1:00 so he will need more for the rest of the day.

## 2012-05-02 NOTE — Patient Instructions (Addendum)
Your physician recommends that you schedule a follow-up appointment on 7/22 with Dr Graciela Husbands and Coumadin clinic  Your physician has recommended you make the following change in your medication: STOP Tikosyn and START Amiodarone 200 mg three pills three times a day  DECREASE Digoxin to 0.125 mg daily (take 1/2 tab of 0.25 mg)   RESUME Warfarin dose taking 1/2 on Mon and Fri.

## 2012-05-05 ENCOUNTER — Encounter: Payer: Self-pay | Admitting: Internal Medicine

## 2012-05-05 ENCOUNTER — Ambulatory Visit (INDEPENDENT_AMBULATORY_CARE_PROVIDER_SITE_OTHER): Payer: Medicare HMO | Admitting: Internal Medicine

## 2012-05-05 ENCOUNTER — Encounter: Payer: Self-pay | Admitting: *Deleted

## 2012-05-05 ENCOUNTER — Ambulatory Visit (INDEPENDENT_AMBULATORY_CARE_PROVIDER_SITE_OTHER): Payer: Medicare HMO | Admitting: *Deleted

## 2012-05-05 VITALS — BP 115/78 | Ht 70.0 in | Wt 330.0 lb

## 2012-05-05 DIAGNOSIS — I4891 Unspecified atrial fibrillation: Secondary | ICD-10-CM

## 2012-05-05 DIAGNOSIS — I452 Bifascicular block: Secondary | ICD-10-CM

## 2012-05-05 DIAGNOSIS — I5022 Chronic systolic (congestive) heart failure: Secondary | ICD-10-CM

## 2012-05-05 DIAGNOSIS — Z01810 Encounter for preprocedural cardiovascular examination: Secondary | ICD-10-CM

## 2012-05-05 DIAGNOSIS — Z7901 Long term (current) use of anticoagulants: Secondary | ICD-10-CM

## 2012-05-05 LAB — POCT INR: INR: 3.2

## 2012-05-05 MED ORDER — CARVEDILOL 6.25 MG PO TABS: 6.2500 mg | ORAL_TABLET | Freq: Two times a day (BID) | ORAL | Status: DC

## 2012-05-05 MED ORDER — AMIODARONE HCL 200 MG PO TABS: 800.0000 mg | ORAL_TABLET | Freq: Three times a day (TID) | ORAL | Status: DC

## 2012-05-05 NOTE — Assessment & Plan Note (Signed)
He is currently on ACE inhibitors and beta blockers. It is appropriate to consider him for Aldactone. It was evident dysfunction we will continue him on his digoxin

## 2012-05-05 NOTE — Assessment & Plan Note (Signed)
This is notably new in the last month.  He is anticoagulated and is therapeutic so while the concern of pulmonary embolism is suggested he is currently appropriately treated

## 2012-05-05 NOTE — Progress Notes (Signed)
/  HPI  [name] is a 56 y.o. male Seen in followup for atrial fibrillation.   Nuclear study in 04/2011 demonstrated an EF of 44%, small inferior wall infarct at mid and apical level with no ischemia. Echo 04/15/12: Mild LVH, EF 45-50%, prob mild HK mid to dist inf wall, severe LAE, mild RVE, mild reduced RV fxn, mod RAE. He was symptomatic with his atrial fibrillation. He failed DCCV x 1. Rhythm control was recommended. With his CM and likely underlying CAD, Class 1C drugs could not be chosen. He was therefore admitted 7/11-7/15 for dofetilide load. His INR was sub-therapeutic and he underwent TEE-DCCV. TEE 04/24/12: EF 20-25%, trace MR, trace TR, no LA clot.   He was cardioverted to sinus but then reverted post discharge to afib   Last week we offereed hospitaliation vs out pt loading with amiodarone; he chose the latter   He comes in today continuing to feel really lousy with shortness of breath dyspnea with minimal exertion. His edema is better. He has some pain along his right temporal area. He has some complaints of pain behind his right eyeball. She      Past Medical History  Diagnosis Date  . ALLERGIC RHINITIS   . ANXIETY   . ASTHMA   . Chronic pain syndrome   . BENIGN PROSTATIC HYPERTROPHY   . DEPRESSION   . DIABETES MELLITUS, TYPE II   . DYSKINESIA, ESOPHAGUS   . FIBROMYALGIA   . GERD   . HYPERLIPIDEMIA   . HYPERTENSION   . INSOMNIA   . LOW BACK PAIN   . Morbid obesity     Anticipated LAP-BAND surgery  . NEPHROLITHIASIS, HX OF   . OBSESSIVE-COMPULSIVE DISORDER   . ORCHITIS   . PERIPHERAL EDEMA   . PLANTAR FASCIITIS, BILATERAL   . POLYARTHRALGIA   . Rash and other nonspecific skin eruption 04/12/2009  . SLEEP APNEA, OBSTRUCTIVE   . Atrial fibrillation     Initially identified 2001, recurring 2013  . Cardiomyopathy     Myoview with prior infarct- 2012; EF 25% TEE July 2013  . Syncope     Associated with atrial fibrillation 2001  . H/O hiatal hernia   .  Myocardial infarction     hx of MI    Past Surgical History  Procedure Date  . Tonsillectomy   . Acne cyst removal     2 on back   . Cardioversion 04/18/2012    Procedure: CARDIOVERSION;  Surgeon: Pricilla Riffle, MD;  Location: [facility] OR;  Service: Cardiovascular;  Laterality: N/A;  . Tee without cardioversion 04/25/2012    Procedure: TRANSESOPHAGEAL ECHOCARDIOGRAM (TEE);  Surgeon: Vesta Mixer, MD;  Location: [facility] ENDOSCOPY;  Service: Cardiovascular;  Laterality: N/A;  . Cardioversion 04/25/2012    Procedure: CARDIOVERSION;  Surgeon: Vesta Mixer, MD;  Location: [facility] ENDOSCOPY;  Service: Cardiovascular;  Laterality: N/A;  . Cardioversion 04/25/2012    Procedure: CARDIOVERSION;  Surgeon: Pricilla Riffle, MD;  Location: [facility] OR;  Service: Cardiovascular;  Laterality: N/A;    Current Outpatient Prescriptions  Medication Sig Dispense Refill  . albuterol (PROVENTIL HFA;VENTOLIN HFA) 108 (90 BASE) MCG/ACT inhaler Inhale 2 puffs into the lungs every 4 (four) hours as needed. For wheezing      . ALPRAZolam (XANAX XR) 1 MG 24 hr tablet Take 1 mg by mouth at bedtime as needed. For anxiety, for sleep      . amiodarone (PACERONE) 200 MG tablet Take 4 tablets (800 mg total)  by mouth 3 (three) times daily.  320 tablet  2  . carisoprodol (SOMA) 350 MG tablet Take 350 mg by mouth 3 (three) times daily as needed. For muscle spasms      . carvedilol (COREG) 6.25 MG tablet Take 1 tablet (6.25 mg total) by mouth 2 (two) times daily with a meal.  60 tablet  3  . clobetasol cream (TEMOVATE) 0.05 % Apply 1 application topically daily.       . digoxin (LANOXIN) 0.125 MG tablet Take 0.125 mg by mouth daily. taking1/2daily      . doxepin (SINEQUAN) 25 MG capsule Take 25 mg by mouth 2 (two) times daily.      . Fluticasone-Salmeterol (ADVAIR) 250-50 MCG/DOSE AEPB Inhale 1 puff into the lungs daily.      . furosemide (LASIX) 40 MG tablet Take 40 mg by mouth 2 (two) times daily.       . hydrocortisone 2.5 % cream Apply 1  application topically 2 (two) times daily. As needed to facial areas only      . lisinopril (PRINIVIL,ZESTRIL) 2.5 MG tablet Take 1 tablet (2.5 mg total) by mouth daily.  30 tablet  3  . metFORMIN (GLUCOPHAGE) 500 MG tablet Take 500 mg by mouth 2 (two) times daily with a meal.      . omeprazole (PRILOSEC) 20 MG capsule Take 20 mg by mouth 2 (two) times daily.      . potassium chloride SA (K-DUR,KLOR-CON) 20 MEQ tablet Take 1 tablet (20 mEq total) by mouth daily.  30 tablet  3  . sertraline (ZOLOFT) 100 MG tablet Take 200 mg by mouth daily.       . traZODone (DESYREL) 100 MG tablet Take 50 mg by mouth at bedtime.      [name] warfarin (COUMADIN) 5 MG tablet Take 2.5-5 mg by mouth daily. Take 2.5mg  on Mon & Fri & 5mg  on all other days      . zolpidem (AMBIEN) 10 MG tablet Take 10 mg by mouth at bedtime as needed. For sleep      . DISCONTD: amiodarone (PACERONE) 200 MG tablet Take 3 tablets (600 mg total) by mouth 3 (three) times daily.  270 tablet  2  . DISCONTD: carvedilol (COREG) 3.125 MG tablet Take 1 tablet (3.125 mg total) by mouth 2 (two) times daily with a meal.  60 tablet  3  . DISCONTD: digoxin (LANOXIN) 0.125 MG tablet Take 1 tablet (0.125 mg total) by mouth daily.      [name] DISCONTD: atorvastatin (LIPITOR) 20 MG tablet Take 1 tablet (20 mg total) by mouth daily.  30 tablet  11  . DISCONTD: gabapentin (NEURONTIN) 300 MG capsule Take 1 capsule (300 mg total) by mouth 3 (three) times daily.  90 capsule  5    Allergies  Allergen Reactions  . Simvastatin     REACTION: myalgia  . Statins Other (See Comments)    myalgia    Review of Systems negative except from HPI and PMH  Physical Exam BP 115/78  Ht 5\' 10"  (1.778 m)  Wt 330 lb (149.687 kg)  BMI 47.35 kg/m2 Well developed  morbidly obese Caucasian male in mild respiratory distress HENT normal E scleral and icterus clear Neck Supple JVP unable to detect  Clear to ausculation irregularly irregular with controlled ventricular response  no  murmurs gallops or rub Soft with active bowel sounds No clubbing cyanosis none Edema Alert and oriented, grossly normal motor and sensory function Skin Warm and Dry  Electrocardiogram demonstrates atrial fibrillation at 92 Exline intervals-/13/40 and axis is 260 Right bundle branch block right axis deviation  Assessment and  Plan

## 2012-05-05 NOTE — Assessment & Plan Note (Signed)
The patient has persistent atrial fibrillation which in the setting of left ventricular dysfunction is probably aggravated it with both rate issues and secondary cardiomyopathy as well as loss of atrial contractility in the setting of known diastolic dysfunction with obstructive sleep apnea left ventricular hypertrophy hypertensive heart disease he felt much better with his transient sojourn into sinus rhythm. Restoring sinus rhythm it is best bet;  we will continue to try to loaded with amiodarone with the anticipation of undertaking cardioversion on Friday. If this does not succeed, further loading for a week or 2 would be appropriate.  The other concern that I have for his dyspnea with his change electrocardiogram is pulmonary embolism. He is already on Coumadin and is therapeutic. I guess if we were to find a pulmonary embolism as a potential role for filter. We'll undertake a CT scan

## 2012-05-05 NOTE — Patient Instructions (Addendum)
Your physician has recommended you make the following change in your medication: AMIODARONE TO 800 MG EVERY  8 HOURS AND CARVEDILOL TO 6.25 MG TWICE DAILY  Your physician has recommended that you have a Cardioversion (DCCV). Electrical Cardioversion uses a jolt of electricity to your heart either through paddles or wired patches attached to your chest. This is a controlled, usually prescheduled, procedure. Defibrillation is done under light anesthesia in the hospital, and you usually go home the day of the procedure. This is done to get your heart back into a normal rhythm. You are not awake for the procedure. Please see the instruction sheet given to you today. ON Friday WITH DR Jens Som Your physician recommends that you return for lab work in: TODAY  BMET   SED RATE  DX   427.31  V72.81

## 2012-05-06 ENCOUNTER — Encounter (HOSPITAL_COMMUNITY): Payer: Self-pay | Admitting: Pharmacy Technician

## 2012-05-06 ENCOUNTER — Telehealth: Payer: Self-pay | Admitting: *Deleted

## 2012-05-06 ENCOUNTER — Other Ambulatory Visit: Payer: Self-pay | Admitting: Internal Medicine

## 2012-05-06 ENCOUNTER — Telehealth: Payer: Self-pay | Admitting: Internal Medicine

## 2012-05-06 LAB — BASIC METABOLIC PANEL
BUN: 17 mg/dL (ref 6–23)
CO2: 28 mEq/L (ref 19–32)
Chloride: 99 mEq/L (ref 96–112)
Creatinine, Ser: 1 mg/dL (ref 0.4–1.5)
Glucose, Bld: 93 mg/dL (ref 70–99)

## 2012-05-06 MED ORDER — WARFARIN SODIUM 5 MG PO TABS: ORAL_TABLET | ORAL | Status: DC

## 2012-05-06 MED ORDER — DOXEPIN HCL 25 MG PO CAPS: 25.0000 mg | ORAL_CAPSULE | Freq: Two times a day (BID) | ORAL | Status: DC

## 2012-05-06 MED ORDER — FUROSEMIDE 40 MG PO TABS: 40.0000 mg | ORAL_TABLET | Freq: Two times a day (BID) | ORAL | Status: DC

## 2012-05-06 MED ORDER — POTASSIUM CHLORIDE CRYS ER 20 MEQ PO TBCR: 20.0000 meq | EXTENDED_RELEASE_TABLET | Freq: Every day | ORAL | Status: DC

## 2012-05-06 NOTE — Addendum Note (Signed)
Addended by: Migdalia Dk on: 05/06/2012 02:43 PM   Modules accepted: Orders

## 2012-05-06 NOTE — Telephone Encounter (Signed)
F/u  Refill - amiodarone (PACERONE) 200 MG tablet                         - carvedilol (COREG) 6.25 MG tablet                   -digoxin (LANOXIN) 0.125 MG tablet                  -doxepin (SINEQUAN) 25 MG capsule                 -furosemide (LASIX) 40 MG tablet                -lisinopril (PRINIVIL,ZESTRIL) 2.5 MG tablet               - potassium chloride SA (K-DUR,KLOR-CON) 20 MEQ tablet             -warfarin (COUMADIN) 5 MG tablet                 Pt med [facility] in error, this medication should have been refilled via [facility]

## 2012-05-06 NOTE — Telephone Encounter (Signed)
Reviewed cardioversion instructions with pt via phone call./cy

## 2012-05-07 NOTE — Telephone Encounter (Signed)
close

## 2012-05-08 ENCOUNTER — Other Ambulatory Visit: Payer: Self-pay | Admitting: *Deleted

## 2012-05-08 DIAGNOSIS — I4891 Unspecified atrial fibrillation: Secondary | ICD-10-CM

## 2012-05-09 ENCOUNTER — Ambulatory Visit (HOSPITAL_COMMUNITY): Payer: Medicare HMO | Admitting: Anesthesiology

## 2012-05-09 ENCOUNTER — Encounter: Payer: Self-pay | Admitting: Internal Medicine

## 2012-05-09 ENCOUNTER — Encounter (HOSPITAL_COMMUNITY): Payer: Self-pay | Admitting: Anesthesiology

## 2012-05-09 ENCOUNTER — Ambulatory Visit (HOSPITAL_COMMUNITY)
Admission: RE | Admit: 2012-05-09 | Discharge: 2012-05-09 | Disposition: A | Discharge status: Home / Self Care | Payer: Medicare HMO | Source: Ambulatory Visit | Attending: Cardiology | Admitting: Cardiology

## 2012-05-09 ENCOUNTER — Encounter (HOSPITAL_COMMUNITY)
Admission: RE | Disposition: A | Discharge status: Home / Self Care | Payer: Self-pay | Source: Ambulatory Visit | Attending: Cardiology

## 2012-05-09 DIAGNOSIS — I4891 Unspecified atrial fibrillation: Secondary | ICD-10-CM

## 2012-05-09 DIAGNOSIS — I10 Essential (primary) hypertension: Secondary | ICD-10-CM | POA: Insufficient documentation

## 2012-05-09 DIAGNOSIS — J449 Chronic obstructive pulmonary disease, unspecified: Secondary | ICD-10-CM | POA: Insufficient documentation

## 2012-05-09 DIAGNOSIS — J4489 Other specified chronic obstructive pulmonary disease: Secondary | ICD-10-CM | POA: Insufficient documentation

## 2012-05-09 DIAGNOSIS — I252 Old myocardial infarction: Secondary | ICD-10-CM | POA: Insufficient documentation

## 2012-05-09 DIAGNOSIS — E119 Type 2 diabetes mellitus without complications: Secondary | ICD-10-CM | POA: Insufficient documentation

## 2012-05-09 HISTORY — PX: CARDIOVERSION: SHX1299

## 2012-05-09 SURGERY — CARDIOVERSION
Anesthesia: General | Wound class: Clean

## 2012-05-09 MED ORDER — SODIUM CHLORIDE 0.9 % IJ SOLN: 3.0000 mL | INTRAMUSCULAR | Status: DC | PRN

## 2012-05-09 MED ORDER — SODIUM CHLORIDE 0.9 % IV SOLN: 250.0000 mL | INTRAVENOUS | Status: DC

## 2012-05-09 MED ORDER — HYDROCORTISONE 1 % EX CREA
1.0000 "application " | TOPICAL_CREAM | Freq: Three times a day (TID) | CUTANEOUS | Status: DC | PRN
  Administered 2012-05-09: 1 via TOPICAL
  Filled 2012-05-09: qty 28

## 2012-05-09 MED ORDER — PROPOFOL 10 MG/ML IV BOLUS
INTRAVENOUS | Status: DC | PRN
  Administered 2012-05-09: 120 mg via INTRAVENOUS

## 2012-05-09 MED ORDER — LIDOCAINE HCL (CARDIAC) 20 MG/ML IV SOLN
INTRAVENOUS | Status: DC | PRN
  Administered 2012-05-09: 60 mg via INTRAVENOUS

## 2012-05-09 MED ORDER — SODIUM CHLORIDE 0.9 % IV SOLN: INTRAVENOUS | Status: DC

## 2012-05-09 MED ORDER — SODIUM CHLORIDE 0.9 % IV SOLN
INTRAVENOUS | Status: DC | PRN
  Administered 2012-05-09: 11:00:00 via INTRAVENOUS

## 2012-05-09 MED ORDER — SODIUM CHLORIDE 0.9 % IJ SOLN: 3.0000 mL | Freq: Two times a day (BID) | INTRAMUSCULAR | Status: DC

## 2012-05-09 NOTE — Anesthesia Postprocedure Evaluation (Signed)
  Anesthesia Post-op Note  Patient: [name]  Procedure(s) Performed: Procedure(s) (LRB): CARDIOVERSION (N/A)  Patient Location: PACU and Short Stay  Anesthesia Type: General  Level of Consciousness: awake and alert   Airway and Oxygen Therapy: Patient Spontanous Breathing  Post-op Pain: none  Post-op Assessment: Post-op Vital signs reviewed, Patient's Cardiovascular Status Stable, Respiratory Function Stable, Patent Airway, No signs of Nausea or vomiting and Pain level controlled  Post-op Vital Signs: stable  Complications: No apparent anesthesia complications

## 2012-05-09 NOTE — Progress Notes (Signed)
Time out taken dr Gypsy Balsam, dr cooper at bedside.  1110 induction began, 1111 pt cardioverted at 200 J synch tolerated well, will continue to monitor

## 2012-05-09 NOTE — Anesthesia Preprocedure Evaluation (Addendum)
Anesthesia Evaluation  Patient identified by MRN, date of birth, ID band Patient awake    Reviewed: Allergy & Precautions, H&P , NPO status , Patient's Chart, lab work & pertinent test results, reviewed documented beta blocker date and time   History of Anesthesia Complications Negative for: history of anesthetic complications  Airway Mallampati: III TM Distance: >3 FB Neck ROM: Limited    Dental  (+) Teeth Intact and Dental Advisory Given   Pulmonary shortness of breath, with exertion, at rest and lying, asthma , sleep apnea and Continuous Positive Airway Pressure Ventilation ,  + rhonchi   + decreased breath sounds      Cardiovascular hypertension, Pt. on medications and Pt. on home beta blockers + Past MI (04/2011), + Orthopnea (using multiple pillows at night) and + DOE + dysrhythmias Atrial Fibrillation Rhythm:Irregular Rate:Normal  Cardiomyopathy   Nuclear study in 04/2011 demonstrated an EF of 44%, small inferior wall infarct at mid and apical level with no ischemia  TEE 04/24/12: EF 20-25%, trace MR, trace TR, no LA clot     Neuro/Psych PSYCHIATRIC DISORDERS Anxiety Depression  Neuromuscular disease    GI/Hepatic hiatal hernia, GERD-  Medicated,  Endo/Other  Type 2, Oral Hypoglycemic AgentsMorbid obesity  Renal/GU  Bladder dysfunction (BPH)      Musculoskeletal  (+) Fibromyalgia -  Abdominal (+) + obese,   Peds  Hematology  (+) Blood dyscrasia (coumadin ), ,   Anesthesia Other Findings   Reproductive/Obstetrics                      Anesthesia Physical Anesthesia Plan  ASA: IV  Anesthesia Plan: General   Post-op Pain Management:    Induction: Intravenous  Airway Management Planned: Mask  Additional Equipment:   Intra-op Plan:   Post-operative Plan:   Informed Consent: I have reviewed the patients History and Physical, chart, labs and discussed the procedure including the  risks, benefits and alternatives for the proposed anesthesia with the patient or authorized representative who has indicated his/her understanding and acceptance.     Plan Discussed with: CRNA and Surgeon  Anesthesia Plan Comments:         Anesthesia Quick Evaluation

## 2012-05-09 NOTE — Preoperative (Signed)
Beta Blockers   Reason not to administer Beta Blockers:Carvedilol 04/2512 1800

## 2012-05-09 NOTE — CV Procedure (Signed)
Procedure: DCCV Indication: atrial fibrillation and CHF Anticoagulation: Coumadin (INR 2.2) Anesthesia: propofol Pt successfully cardioverted with a single shock 200J biphasic to sinus rhythm heart rate 60. 12 lead EKG pending.  Awake and moves all extremities to command post-procedure.  Tonny Bollman 05/09/2012 11:22 AM

## 2012-05-09 NOTE — Transfer of Care (Signed)
Immediate Anesthesia Transfer of Care Note  Patient: [name]  Procedure(s) Performed: Procedure(s) (LRB): CARDIOVERSION (N/A)  Patient Location: Short Stay  Anesthesia Type: MAC  Level of Consciousness: awake, alert , oriented and patient cooperative  Airway & Oxygen Therapy: Patient Spontanous Breathing and Patient connected to nasal cannula oxygen  Post-op Assessment: Report given to PACU RN and Post -op Vital signs reviewed and stable  Post vital signs: Reviewed and stable  Complications: No apparent anesthesia complications

## 2012-05-12 ENCOUNTER — Ambulatory Visit (INDEPENDENT_AMBULATORY_CARE_PROVIDER_SITE_OTHER): Payer: Medicare HMO | Admitting: *Deleted

## 2012-05-12 ENCOUNTER — Encounter (HOSPITAL_COMMUNITY): Payer: Self-pay | Admitting: Cardiovascular Disease

## 2012-05-12 ENCOUNTER — Ambulatory Visit: Payer: Medicare HMO | Admitting: Physician Assistant

## 2012-05-12 VITALS — HR 80 | Resp 18

## 2012-05-12 DIAGNOSIS — I4891 Unspecified atrial fibrillation: Secondary | ICD-10-CM

## 2012-05-12 DIAGNOSIS — R0989 Other specified symptoms and signs involving the circulatory and respiratory systems: Secondary | ICD-10-CM

## 2012-05-12 DIAGNOSIS — Z7901 Long term (current) use of anticoagulants: Secondary | ICD-10-CM

## 2012-05-12 LAB — POCT INR: INR: 2.7

## 2012-05-12 NOTE — Patient Instructions (Signed)
Your physician has recommended you make the following change in your medication:  1) Decrease amiodarone to 200 mg two tablets by mouth three times a day.  Your physician has recommended that you have a Cardioversion (DCCV). Electrical Cardioversion uses a jolt of electricity to your heart either through paddles or wired patches attached to your chest. This is a controlled, usually prescheduled, procedure. Defibrillation is done under light anesthesia in the hospital, and you usually go home the day of the procedure. This is done to get your heart back into a normal rhythm. You are not awake for the procedure. Please see the instruction sheet given to you today.

## 2012-05-12 NOTE — Progress Notes (Signed)
The patient was in the office today for a coumadin check. He was at the front desk to check out and explained to the [facility] that he did not feel well. He had a DCCV on Friday 05/09/12. He states that he felt himself go out of rhythm Friday night. His ekg today shows that he is in a-fib with a heart rate of 80 bpm. The patient's situation was discussed with Dr. Johney Frame. Orders received to have the patient decrease amiodarone to 400 mg three times a day with plans for a repeat DCCV in 2 weeks. I have reviewed these recommendations with the patient and he is agreeable. I will contact him to schedule his DCCV.

## 2012-05-15 ENCOUNTER — Other Ambulatory Visit: Payer: Self-pay | Admitting: Internal Medicine

## 2012-05-16 ENCOUNTER — Other Ambulatory Visit: Payer: Self-pay

## 2012-05-16 ENCOUNTER — Encounter: Payer: Self-pay | Admitting: *Deleted

## 2012-05-16 MED ORDER — FLUTICASONE-SALMETEROL 250-50 MCG/DOSE IN AEPB: 1.0000 | INHALATION_SPRAY | Freq: Every day | RESPIRATORY_TRACT | Status: DC

## 2012-05-20 ENCOUNTER — Other Ambulatory Visit: Payer: Self-pay | Admitting: Internal Medicine

## 2012-05-20 DIAGNOSIS — I4891 Unspecified atrial fibrillation: Secondary | ICD-10-CM

## 2012-05-21 ENCOUNTER — Encounter (INDEPENDENT_AMBULATORY_CARE_PROVIDER_SITE_OTHER): Payer: Medicare HMO | Admitting: General Surgery

## 2012-05-21 ENCOUNTER — Ambulatory Visit (INDEPENDENT_AMBULATORY_CARE_PROVIDER_SITE_OTHER): Payer: Medicare HMO | Admitting: *Deleted

## 2012-05-21 DIAGNOSIS — Z7901 Long term (current) use of anticoagulants: Secondary | ICD-10-CM

## 2012-05-21 DIAGNOSIS — I4891 Unspecified atrial fibrillation: Secondary | ICD-10-CM

## 2012-05-25 ENCOUNTER — Other Ambulatory Visit: Payer: Self-pay | Admitting: Internal Medicine

## 2012-05-25 DIAGNOSIS — I4891 Unspecified atrial fibrillation: Secondary | ICD-10-CM

## 2012-05-25 MED ORDER — SODIUM CHLORIDE 0.9 % IV SOLN: INTRAVENOUS | Status: AC

## 2012-05-26 ENCOUNTER — Encounter (HOSPITAL_COMMUNITY): Payer: Self-pay | Admitting: Certified Registered"

## 2012-05-26 ENCOUNTER — Ambulatory Visit (INDEPENDENT_AMBULATORY_CARE_PROVIDER_SITE_OTHER): Payer: Medicare HMO | Admitting: Pharmacist

## 2012-05-26 ENCOUNTER — Ambulatory Visit (HOSPITAL_COMMUNITY)
Admission: RE | Admit: 2012-05-26 | Discharge: 2012-05-26 | Disposition: A | Discharge status: Home / Self Care | Payer: Medicare HMO | Source: Ambulatory Visit | Attending: Internal Medicine | Admitting: Internal Medicine

## 2012-05-26 ENCOUNTER — Encounter (HOSPITAL_COMMUNITY)
Admission: RE | Disposition: A | Discharge status: Home / Self Care | Payer: Self-pay | Source: Ambulatory Visit | Attending: Internal Medicine

## 2012-05-26 DIAGNOSIS — I4891 Unspecified atrial fibrillation: Secondary | ICD-10-CM

## 2012-05-26 DIAGNOSIS — Z7901 Long term (current) use of anticoagulants: Secondary | ICD-10-CM

## 2012-05-26 DIAGNOSIS — Z993 Dependence on wheelchair: Secondary | ICD-10-CM | POA: Insufficient documentation

## 2012-05-26 SURGERY — Surgical Case

## 2012-05-26 NOTE — Anesthesia Preprocedure Evaluation (Deleted)
Anesthesia Evaluation  Patient identified by MRN, date of birth, ID band Patient awake    Reviewed: Allergy & Precautions, H&P , NPO status , Patient's Chart, lab work & pertinent test results, reviewed documented beta blocker date and time   Airway       Dental   Pulmonary asthma , sleep apnea ,          Cardiovascular hypertension, + Past MI + dysrhythmias Atrial Fibrillation     Neuro/Psych Anxiety Depression    GI/Hepatic hiatal hernia, GERD-  ,  Endo/Other    Renal/GU      Musculoskeletal   Abdominal   Peds  Hematology   Anesthesia Other Findings   Reproductive/Obstetrics                           Anesthesia Physical Anesthesia Plan  ASA: III  Anesthesia Plan: General   Post-op Pain Management:    Induction: Intravenous  Airway Management Planned: Mask  Additional Equipment:   Intra-op Plan:   Post-operative Plan:   Informed Consent: I have reviewed the patients History and Physical, chart, labs and discussed the procedure including the risks, benefits and alternatives for the proposed anesthesia with the patient or authorized representative who has indicated his/her understanding and acceptance.   Dental advisory given  Plan Discussed with: CRNA, Anesthesiologist and Surgeon  Anesthesia Plan Comments:         Anesthesia Quick Evaluation

## 2012-05-26 NOTE — Preoperative (Signed)
Beta Blockers   Reason not to administer Beta Blockers:Not Applicable 

## 2012-05-28 ENCOUNTER — Other Ambulatory Visit: Payer: Self-pay | Admitting: Internal Medicine

## 2012-05-28 ENCOUNTER — Telehealth: Payer: Self-pay | Admitting: Internal Medicine

## 2012-05-28 DIAGNOSIS — I4891 Unspecified atrial fibrillation: Secondary | ICD-10-CM

## 2012-05-28 MED ORDER — CARISOPRODOL 350 MG PO TABS: 350.0000 mg | ORAL_TABLET | Freq: Three times a day (TID) | ORAL | Status: DC | PRN

## 2012-05-28 NOTE — Telephone Encounter (Signed)
Received refill request from [facility].  Request refills for Carisoprodol 350mg  .  PT last seen 03/21/12 this is a historical medication  . Please advise Thanks

## 2012-05-28 NOTE — Telephone Encounter (Signed)
Done hardcopy to robin  

## 2012-05-28 NOTE — Telephone Encounter (Signed)
Faxed hardcopy to pharmacy. 

## 2012-05-28 NOTE — Telephone Encounter (Signed)
Pt wants 90 days supply pt wants 2wk supply of [facility]opril [facility] [address] until he get [employer]

## 2012-05-29 ENCOUNTER — Other Ambulatory Visit: Payer: Self-pay | Admitting: Internal Medicine

## 2012-05-29 MED ORDER — LISINOPRIL 2.5 MG PO TABS: 2.5000 mg | ORAL_TABLET | Freq: Every day | ORAL | Status: DC

## 2012-05-29 MED ORDER — CARVEDILOL 6.25 MG PO TABS: 6.2500 mg | ORAL_TABLET | Freq: Two times a day (BID) | ORAL | Status: DC

## 2012-05-29 MED ORDER — AMIODARONE HCL 200 MG PO TABS: ORAL_TABLET | ORAL | Status: DC

## 2012-05-29 NOTE — Telephone Encounter (Signed)
Called pharmacy to inform prescription was faxed yesterday 05/28/12.  They informed their fax machine was down yesterday and would be why they did not received refill. Informed the pharmacist of refill information.

## 2012-06-02 ENCOUNTER — Telehealth: Payer: Self-pay | Admitting: Internal Medicine

## 2012-06-02 ENCOUNTER — Other Ambulatory Visit: Payer: Self-pay | Admitting: Internal Medicine

## 2012-06-02 ENCOUNTER — Ambulatory Visit (INDEPENDENT_AMBULATORY_CARE_PROVIDER_SITE_OTHER): Payer: Medicare HMO | Admitting: *Deleted

## 2012-06-02 ENCOUNTER — Telehealth: Payer: Self-pay | Admitting: *Deleted

## 2012-06-02 DIAGNOSIS — Z7901 Long term (current) use of anticoagulants: Secondary | ICD-10-CM

## 2012-06-02 DIAGNOSIS — I4891 Unspecified atrial fibrillation: Secondary | ICD-10-CM

## 2012-06-02 LAB — POCT INR: INR: 1.9

## 2012-06-02 MED ORDER — DIGOXIN 125 MCG PO TABS: 0.1250 mg | ORAL_TABLET | Freq: Every day | ORAL | Status: DC

## 2012-06-02 MED ORDER — CARVEDILOL 6.25 MG PO TABS: 6.2500 mg | ORAL_TABLET | Freq: Two times a day (BID) | ORAL | Status: DC

## 2012-06-02 MED ORDER — AMIODARONE HCL 200 MG PO TABS: ORAL_TABLET | ORAL | Status: DC

## 2012-06-02 NOTE — Telephone Encounter (Signed)
New msg [facility] wants to know about possible drug interaction between tikosyn and amiodarone

## 2012-06-02 NOTE — Telephone Encounter (Signed)
Pt brought over from [facility] with c/o not getting meds from [facility]--i called [facility] and got all meds straightened out and reordered--advised pt all taken care of--they will be mailed

## 2012-06-02 NOTE — Telephone Encounter (Signed)
Autumn, pharmacist, [facility] regarding Amiodarone and Tikosyn red flag that came up in their system.  Pt is not on Tikosyn but is on Amiodarone and looks to be ordered this medication from [facility] last week. Mylo Red RN

## 2012-06-02 NOTE — Telephone Encounter (Signed)
Per pt call [facility] didn't receive please resend

## 2012-06-03 ENCOUNTER — Telehealth: Payer: Self-pay | Admitting: Internal Medicine

## 2012-06-03 NOTE — Telephone Encounter (Signed)
Forward 2 pages from [facility] to Dr. [name] for review on 06-03-12 ym

## 2012-06-04 ENCOUNTER — Ambulatory Visit (INDEPENDENT_AMBULATORY_CARE_PROVIDER_SITE_OTHER): Payer: Medicare HMO | Admitting: Pulmonary Disease

## 2012-06-04 ENCOUNTER — Encounter: Payer: Self-pay | Admitting: Pulmonary Disease

## 2012-06-04 VITALS — BP 122/70 | HR 70 | Temp 98.3°F | Ht 70.0 in | Wt 330.6 lb

## 2012-06-04 DIAGNOSIS — R0609 Other forms of dyspnea: Secondary | ICD-10-CM

## 2012-06-04 DIAGNOSIS — R0600 Dyspnea, unspecified: Secondary | ICD-10-CM

## 2012-06-04 DIAGNOSIS — G4733 Obstructive sleep apnea (adult) (pediatric): Secondary | ICD-10-CM

## 2012-06-04 DIAGNOSIS — R0902 Hypoxemia: Secondary | ICD-10-CM

## 2012-06-04 NOTE — Assessment & Plan Note (Signed)
Never followed up with me after starting on CPAP.  He states that he is wearing CPAP compliantly, and has no difficulties with his machine.  However, he appears very sleepy currently.  Will need to get a download off his device and make adjustments as needed.

## 2012-06-04 NOTE — Assessment & Plan Note (Signed)
The patient has severe dyspnea on exertion that clearly is multifactorial.  He is morbidly obese, has a cardiomyopathy with heart failure, has an atrial arrhythmia, and probably has an element of obesity hypoventilation.  It isn't clear if he has intrinsic lung disease.  He does have a long history of smoking, and I think he needs pulmonary function studies to evaluate for emphysema.  He will also need to be started on chronic oxygen therapy.

## 2012-06-04 NOTE — Progress Notes (Signed)
  Subjective:    Patient ID: [name], male    DOB: 1956-09-26, 56 y.o.   MRN: [id number]  HPI The pt is a 56y/o male who I have been asked to see for dyspnea and hypoxia.  He has a history of sleep apnea, and I have seen him in the past for initiation of CPAP.  Unfortunately, the patient never followed up with me as past, but he does state that he is wearing CPAP compliantly.  He was recently found to have a cardiomyopathy, as well as atrial fibrillation that was poorly controlled.  He failed cardioversion and has recently been started on medication, and currently is maintaining sinus rhythm.  He has been noted to have worsening dyspnea on exertion, as well as intermittent hypoxemia.  The patient states that his breathing is a little better since he has had rate control, and his lower extremity edema has improved.  Complicating all of this, the patient is morbidly obese, states that he has had asthma since childhood, and has an 80-pack-year history of tobacco abuse.  He has never had pulmonary function studies.  Currently, the patient states that he will get short of breath just walking through his house or going up one flight of stairs very slowly.  He has no significant cough or mucus production.  He has had a recent chest x-ray that shows cardiomegaly, but clear lung fields.  His oxygen saturation today in the office at rest on room air is 87%.   Review of Systems  Constitutional: Positive for unexpected weight change. Negative for fever.  HENT: Positive for trouble swallowing. Negative for ear pain, nosebleeds, congestion, sore throat, rhinorrhea, sneezing, dental problem, postnasal drip and sinus pressure.   Eyes: Negative for redness and itching.  Respiratory: Positive for cough, chest tightness and shortness of breath. Negative for wheezing.   Cardiovascular: Positive for chest pain, palpitations and leg swelling.  Gastrointestinal: Positive for abdominal pain. Negative for nausea and  vomiting.       Heartburn Indigestion   Genitourinary: Negative for dysuria.  Musculoskeletal: Positive for arthralgias. Negative for joint swelling.  Skin: Positive for rash.  Neurological: Negative for headaches.  Hematological: Does not bruise/bleed easily.  Psychiatric/Behavioral: Negative for dysphoric mood. The patient is nervous/anxious.   All other systems reviewed and are negative.       Objective:   Physical Exam Constitutional:  Morbidly obese male, no acute distress  HENT:  Nares patent without discharge, large turbinates  Oropharynx without exudate, palate and uvula are thick and elongated.  Eyes:  Perrla, eomi, no scleral icterus  Neck:  No JVD, no TMG  Cardiovascular:  Normal rate, regular rhythm, no rubs or gallops.  No murmurs        Intact distal pulses but decreased.   Pulmonary :  decreased breath sounds due to decreased depth of inspiration, no stridor or respiratory   distress   No rales, rhonchi, or wheezing  Abdominal:  Soft, nondistended, bowel sounds present.  No tenderness noted.   Musculoskeletal:  mild lower extremity edema noted.  Lymph Nodes:  No cervical lymphadenopathy noted  Skin:  No cyanosis noted  Neurologic:  sleepy, appropriate, moves all 4 extremities without obvious deficit.         Assessment & Plan:

## 2012-06-04 NOTE — Patient Instructions (Addendum)
Will get a download off your cpap machine to check on things.  Depending upon the results, may check your oxygen level at night. Will schedule for breathing studies, and arrange followup afterwards. Work on weight loss and conditioning.  Will start on oxygen at 2 liters 24hrs a day for now. Will arrange followup once breathing studies are done.

## 2012-06-04 NOTE — Assessment & Plan Note (Signed)
Multifactorial.  He will need to be started on chronic oxygen therapy.

## 2012-06-10 ENCOUNTER — Other Ambulatory Visit: Payer: Self-pay | Admitting: *Deleted

## 2012-06-10 DIAGNOSIS — I4891 Unspecified atrial fibrillation: Secondary | ICD-10-CM

## 2012-06-10 MED ORDER — AMIODARONE HCL 200 MG PO TABS: ORAL_TABLET | ORAL | Status: DC

## 2012-06-10 NOTE — Telephone Encounter (Signed)
Spoke with Gabby with [facility] about pt medication. Stated to her that Per patient, he is taking his Amiodarone 2 tablets TID. Pharmacist Meg states she will fill and send out Rx to patient. Patient is aware his meds will be shipped.

## 2012-06-25 ENCOUNTER — Ambulatory Visit (INDEPENDENT_AMBULATORY_CARE_PROVIDER_SITE_OTHER): Payer: Medicare HMO | Admitting: *Deleted

## 2012-06-25 DIAGNOSIS — I4891 Unspecified atrial fibrillation: Secondary | ICD-10-CM

## 2012-06-25 DIAGNOSIS — Z7901 Long term (current) use of anticoagulants: Secondary | ICD-10-CM

## 2012-06-25 LAB — POCT INR: INR: 3

## 2012-07-01 ENCOUNTER — Other Ambulatory Visit: Payer: Self-pay | Admitting: Internal Medicine

## 2012-07-02 ENCOUNTER — Other Ambulatory Visit: Payer: Self-pay | Admitting: General Practice

## 2012-07-02 MED ORDER — OMEPRAZOLE 20 MG PO CPDR: 20.0000 mg | DELAYED_RELEASE_CAPSULE | Freq: Two times a day (BID) | ORAL | Status: DC

## 2012-07-08 ENCOUNTER — Telehealth: Payer: Self-pay | Admitting: Pulmonary Disease

## 2012-07-08 ENCOUNTER — Other Ambulatory Visit: Payer: Self-pay | Admitting: Internal Medicine

## 2012-07-08 DIAGNOSIS — R0600 Dyspnea, unspecified: Secondary | ICD-10-CM

## 2012-07-08 NOTE — Telephone Encounter (Signed)
Done erx 

## 2012-07-08 NOTE — Telephone Encounter (Signed)
Per last OV with KC on 8.21.13: Patient Instructions     Will get a download off your cpap machine to check on things. Depending upon the results, may check your oxygen level at night.  Will schedule for breathing studies, and arrange followup afterwards.  Work on weight loss and conditioning.  Will start on oxygen at 2 liters 24hrs a day for now.  Will arrange followup once breathing studies are done.    -----  Called, spoke with pt who states he has been working out and has lost approx 20 lbs since last OV with KC.  States he feels breathing is doing "pretty good" and hasn't been using the o2 at all during the day or qhs.  Is still using cpap qhs.  Requesting order be sent to [facility] to have o2 d/c'd. Dr. Shelle Iron, pls advise.  Thank you.

## 2012-07-08 NOTE — Telephone Encounter (Signed)
Very unlikely losing 20 pounds will enable him to come completely off oxygen. He is welcome to come by and let us recheck his sats. Still need to see his pfts.  When are these scheduled?? Do not see in computer.

## 2012-07-09 NOTE — Telephone Encounter (Signed)
Called, spoke with pt. Informed him of below per Dr. Shelle Iron.  Pt states PFTs where never scheduled (I do not see where an order was ever placed for this either). He would like to go ahead and schedule the PFTs and follow up with Dr. Shelle Iron on same day to go over results and to have o2 sats rechecked.  He is requesting these to be scheduled after Oct 4.  We have scheduled him for PFT on Jul 25, 2012 at 3 pm at [facility] and OV with [facility] same day after PFT at 4:30.  Pt aware of dates and times and ok with this.  He verbalized understanding.  Asked to pls call back if anything further is needed.

## 2012-07-18 ENCOUNTER — Ambulatory Visit (INDEPENDENT_AMBULATORY_CARE_PROVIDER_SITE_OTHER): Payer: Medicare HMO | Admitting: *Deleted

## 2012-07-18 DIAGNOSIS — Z7901 Long term (current) use of anticoagulants: Secondary | ICD-10-CM

## 2012-07-18 DIAGNOSIS — I4891 Unspecified atrial fibrillation: Secondary | ICD-10-CM

## 2012-07-23 ENCOUNTER — Ambulatory Visit (INDEPENDENT_AMBULATORY_CARE_PROVIDER_SITE_OTHER): Payer: Medicare HMO

## 2012-07-23 DIAGNOSIS — Z23 Encounter for immunization: Secondary | ICD-10-CM

## 2012-07-25 ENCOUNTER — Ambulatory Visit: Payer: Medicare HMO | Admitting: Pulmonary Disease

## 2012-07-31 ENCOUNTER — Other Ambulatory Visit: Payer: Self-pay

## 2012-07-31 MED ORDER — ZOLPIDEM TARTRATE 10 MG PO TABS: 10.0000 mg | ORAL_TABLET | Freq: Every evening | ORAL | Status: DC | PRN

## 2012-07-31 NOTE — Telephone Encounter (Signed)
Done hardcopy to robin  

## 2012-07-31 NOTE — Telephone Encounter (Signed)
Faxed hardcopy to pharmacy. 

## 2012-08-02 ENCOUNTER — Other Ambulatory Visit: Payer: Self-pay | Admitting: Internal Medicine

## 2012-08-04 ENCOUNTER — Ambulatory Visit (INDEPENDENT_AMBULATORY_CARE_PROVIDER_SITE_OTHER): Payer: Medicare HMO | Admitting: *Deleted

## 2012-08-04 DIAGNOSIS — I4891 Unspecified atrial fibrillation: Secondary | ICD-10-CM

## 2012-08-04 DIAGNOSIS — Z7901 Long term (current) use of anticoagulants: Secondary | ICD-10-CM

## 2012-08-04 LAB — POCT INR: INR: 4.5

## 2012-08-07 ENCOUNTER — Telehealth: Payer: Self-pay | Admitting: *Deleted

## 2012-08-07 ENCOUNTER — Other Ambulatory Visit: Payer: Self-pay | Admitting: Internal Medicine

## 2012-08-07 NOTE — Telephone Encounter (Signed)
Pt advised to call back, we cannot authorize the refill for his doxepin, that needs to go through his primary care physician/ [facility] for pt to call back. CTuck

## 2012-08-07 NOTE — Telephone Encounter (Signed)
Done erx 

## 2012-08-07 NOTE — Telephone Encounter (Signed)
Pt aware to get medication from Dr. Jonny Ruiz. CTuck

## 2012-08-12 ENCOUNTER — Other Ambulatory Visit: Payer: Self-pay

## 2012-08-12 MED ORDER — HYDROCORTISONE 2.5 % EX CREA: 1.0000 "application " | TOPICAL_CREAM | Freq: Two times a day (BID) | CUTANEOUS | Status: DC

## 2012-08-14 ENCOUNTER — Other Ambulatory Visit: Payer: Self-pay

## 2012-08-14 MED ORDER — ALPRAZOLAM ER 1 MG PO TB24: ORAL_TABLET | ORAL | Status: DC

## 2012-08-14 NOTE — Telephone Encounter (Signed)
Done hardcopy to robin  

## 2012-08-14 NOTE — Telephone Encounter (Signed)
Faxed hardcopy to pharmacy. 

## 2012-08-20 ENCOUNTER — Encounter: Payer: Self-pay | Admitting: Pulmonary Disease

## 2012-08-20 ENCOUNTER — Ambulatory Visit (INDEPENDENT_AMBULATORY_CARE_PROVIDER_SITE_OTHER): Payer: Medicare HMO | Admitting: Pulmonary Disease

## 2012-08-20 ENCOUNTER — Other Ambulatory Visit: Payer: Self-pay | Admitting: Internal Medicine

## 2012-08-20 VITALS — BP 126/72 | HR 60 | Temp 97.8°F | Ht 70.0 in | Wt 296.0 lb

## 2012-08-20 DIAGNOSIS — R0989 Other specified symptoms and signs involving the circulatory and respiratory systems: Secondary | ICD-10-CM

## 2012-08-20 DIAGNOSIS — J4489 Other specified chronic obstructive pulmonary disease: Secondary | ICD-10-CM | POA: Insufficient documentation

## 2012-08-20 DIAGNOSIS — G4733 Obstructive sleep apnea (adult) (pediatric): Secondary | ICD-10-CM

## 2012-08-20 DIAGNOSIS — R0609 Other forms of dyspnea: Secondary | ICD-10-CM

## 2012-08-20 DIAGNOSIS — R0600 Dyspnea, unspecified: Secondary | ICD-10-CM

## 2012-08-20 DIAGNOSIS — J449 Chronic obstructive pulmonary disease, unspecified: Secondary | ICD-10-CM | POA: Insufficient documentation

## 2012-08-20 LAB — PULMONARY FUNCTION TEST

## 2012-08-20 NOTE — Assessment & Plan Note (Signed)
The patient is wearing CPAP very compliantly by his download, but is having severe mask leaking.  We will need to get a better mask fit, and then relook at his download.

## 2012-08-20 NOTE — Assessment & Plan Note (Addendum)
The patient only has mild airflow obstruction on his PFTs, and it is unclear if this is due to emphysema or chronic asthma.  Either way, it is not significant enough to cause hypoxemia.  I suspect that is more related to his underlying heart disease with chronic congestive heart failure, as well as his morbid obesity.   He is not taking his Advair on a regular basis, and I have asked him to take this one inhalation b.i.d.   The patient was walked today and only desaturated to 87% after 3 laps.  He is really not interested in wearing oxygen at this time, but will call him if his symptoms worsen.

## 2012-08-20 NOTE — Progress Notes (Signed)
  Subjective:    Patient ID: [name], male    DOB: 07/15/1956, 56 y.o.   MRN: [id number]  HPI Patient comes in today for followup after his PFTs and also to discuss his sleep download.  He has significant cardiac disease and morbid obesity, and has had issues with hypoxemia.  He has a history of asthma, as well as extensive smoking history.  His PFTs today showed very mild airflow obstruction, no restriction, and a mild reduction in his diffusion capacity.  His download shows that he has a severe mask leak, and this will need to be addressed.   Review of Systems  Constitutional: Negative for fever and unexpected weight change.  HENT: Negative for ear pain, nosebleeds, congestion, sore throat, rhinorrhea, sneezing, trouble swallowing, dental problem, postnasal drip and sinus pressure.   Eyes: Negative for redness and itching.  Respiratory: Positive for shortness of breath. Negative for cough, chest tightness and wheezing.   Cardiovascular: Negative for palpitations and leg swelling.  Gastrointestinal: Negative for nausea and vomiting.  Genitourinary: Negative for dysuria.  Musculoskeletal: Negative for joint swelling.  Skin: Negative for rash.  Neurological: Negative for headaches.  Hematological: Bruises/bleeds easily.  Psychiatric/Behavioral: Positive for dysphoric mood. The patient is nervous/anxious.        Objective:   Physical Exam Obese male in no acute distress No skin breakdown or pressure necrosis from the CPAP mask Nose without purulence or discharge noted Chest with mild decreased breath sounds, no wheezing Heart exam with regular rate and rhythm Lower extremities with mild edema, no cyanosis Alert and oriented, moves all 4 extremities.       Assessment & Plan:

## 2012-08-20 NOTE — Progress Notes (Signed)
PFT done today. 

## 2012-08-20 NOTE — Patient Instructions (Addendum)
Will schedule for mask fitting over at the sleep center, then will download your machine again 2-3 weeks after that. Continue working on weight loss Stay on advair twice a day am and pm religiously.  Rinse mouth well.  Will discontinue oxygen.  followup with me in 6mos if doing well, but I will contact you once I receive your download.

## 2012-08-21 ENCOUNTER — Ambulatory Visit (INDEPENDENT_AMBULATORY_CARE_PROVIDER_SITE_OTHER): Payer: Medicare HMO | Admitting: *Deleted

## 2012-08-21 DIAGNOSIS — I4891 Unspecified atrial fibrillation: Secondary | ICD-10-CM

## 2012-08-21 DIAGNOSIS — Z7901 Long term (current) use of anticoagulants: Secondary | ICD-10-CM

## 2012-08-21 LAB — POCT INR: INR: 2.6

## 2012-08-21 NOTE — Telephone Encounter (Signed)
Faxed hardcopy to pharmacy. 

## 2012-08-25 ENCOUNTER — Ambulatory Visit (HOSPITAL_BASED_OUTPATIENT_CLINIC_OR_DEPARTMENT_OTHER): Payer: Medicare HMO | Attending: Pulmonary Disease

## 2012-08-25 DIAGNOSIS — G4733 Obstructive sleep apnea (adult) (pediatric): Secondary | ICD-10-CM

## 2012-08-28 ENCOUNTER — Telehealth: Payer: Self-pay | Admitting: Pulmonary Disease

## 2012-08-28 NOTE — Telephone Encounter (Signed)
LMTCB

## 2012-08-29 MED ORDER — FLUTICASONE-SALMETEROL 250-50 MCG/DOSE IN AEPB: 1.0000 | INHALATION_SPRAY | Freq: Every day | RESPIRATORY_TRACT | Status: DC

## 2012-08-29 NOTE — Telephone Encounter (Signed)
KC, is it okay with you to provide samples for the pt and we can let him fill out med assistance forms ? Please advise thanks!!

## 2012-08-29 NOTE — Telephone Encounter (Signed)
Called and spoke with pt and he is aware of KC recs to check with his insurance formulary to see if they will cover alternative such as dulera or symbicort.  Pt is aware that sample of advair 250/50 has been left up front and he is to call us back for any questions or concerns.  Nothing further is needed.

## 2012-08-29 NOTE — Telephone Encounter (Signed)
Ok with me, but see if his insurance will cover something better such as dulera or symbicort???

## 2012-09-18 ENCOUNTER — Ambulatory Visit (INDEPENDENT_AMBULATORY_CARE_PROVIDER_SITE_OTHER): Payer: Medicare HMO

## 2012-09-18 DIAGNOSIS — I4891 Unspecified atrial fibrillation: Secondary | ICD-10-CM

## 2012-09-18 DIAGNOSIS — Z7901 Long term (current) use of anticoagulants: Secondary | ICD-10-CM

## 2012-09-18 LAB — POCT INR: INR: 1.9

## 2012-09-25 ENCOUNTER — Encounter: Payer: Self-pay | Admitting: Internal Medicine

## 2012-09-25 ENCOUNTER — Ambulatory Visit
Admission: RE | Admit: 2012-09-25 | Discharge: 2012-09-25 | Disposition: A | Discharge status: Home / Self Care | Payer: Medicare HMO | Source: Ambulatory Visit | Attending: Internal Medicine | Admitting: Internal Medicine

## 2012-09-25 ENCOUNTER — Ambulatory Visit (INDEPENDENT_AMBULATORY_CARE_PROVIDER_SITE_OTHER): Payer: Medicare HMO | Admitting: Internal Medicine

## 2012-09-25 VITALS — BP 110/60 | HR 63 | Temp 97.6°F | Ht 71.0 in | Wt 283.1 lb

## 2012-09-25 DIAGNOSIS — E119 Type 2 diabetes mellitus without complications: Secondary | ICD-10-CM

## 2012-09-25 DIAGNOSIS — R259 Unspecified abnormal involuntary movements: Secondary | ICD-10-CM

## 2012-09-25 DIAGNOSIS — R51 Headache: Secondary | ICD-10-CM

## 2012-09-25 DIAGNOSIS — R251 Tremor, unspecified: Secondary | ICD-10-CM

## 2012-09-25 DIAGNOSIS — R410 Disorientation, unspecified: Secondary | ICD-10-CM

## 2012-09-25 DIAGNOSIS — R29818 Other symptoms and signs involving the nervous system: Secondary | ICD-10-CM

## 2012-09-25 DIAGNOSIS — R2689 Other abnormalities of gait and mobility: Secondary | ICD-10-CM

## 2012-09-25 DIAGNOSIS — F29 Unspecified psychosis not due to a substance or known physiological condition: Secondary | ICD-10-CM

## 2012-09-25 NOTE — Progress Notes (Signed)
Subjective:    Patient ID: [name], male    DOB: [birth year], 56 y.o.   MRN: [id number]  HPI  Here to f/u; c/o constant bitemporal HA for 2-3 wks, mild to occasionally severe, dull but sometimes shooting,  Nothing makes worse except worse with exercise, [facility], feels "dumb" and confused - unusual for him, nothing makes better, assoc with generalized weakenss and clumsiness;  On chronic coumadin - no other bleeding or bruising except for a couple to the left hand;  Tends to have mild tremor ? Worse recently, not sleeping well and has tried 2 different CPAP mask; no recent falls, head injury, but tends to "stagger" like being off balance. No fever, sinus/ear/ST symptoms, cough .  Pt denies chest pain, increased sob or doe, wheezing, orthopnea, PND, increased LE swelling, palpitations, dizziness or syncope, except for dizziness to standing up today.  Pt denies new neurological symptoms such as new headache, or facial or extremity weakness or numbness except for the above.   Pt denies polydipsia, polyuria, or low sugar symptoms such as weakness or confusion improved with po intake.  Pt states overall good compliance with meds, trying to follow lower cholesterol, diabetic diet, wt overall stable but little exercise however.   Also with continued right LBP for 2 wks, but no bowel or bladder change, fever, wt loss,  worsening LE pain/numbness/weakness.  Wants to change coumadin clinic to [facility] due to cost.  INR recent 1.9  - dec 5.  S/p recent cardioversion. Past Medical History  Diagnosis Date  . ALLERGIC RHINITIS   . ANXIETY   . ASTHMA   . Chronic pain syndrome   . BENIGN PROSTATIC HYPERTROPHY   . DEPRESSION   . DIABETES MELLITUS, TYPE II   . DYSKINESIA, ESOPHAGUS   . FIBROMYALGIA   . GERD   . HYPERLIPIDEMIA   . HYPERTENSION   . INSOMNIA   . LOW BACK PAIN   . Morbid obesity     Anticipated LAP-BAND surgery  . NEPHROLITHIASIS, HX OF    . OBSESSIVE-COMPULSIVE DISORDER   . ORCHITIS   . PERIPHERAL EDEMA   . PLANTAR FASCIITIS, BILATERAL   . POLYARTHRALGIA   . Rash and other nonspecific skin eruption 04/12/2009  . SLEEP APNEA, OBSTRUCTIVE   . Atrial fibrillation     Initially identified 2001, recurring 2013  . Cardiomyopathy     Myoview with prior infarct- 2012; EF 25% TEE July 2013  . Syncope     Associated with atrial fibrillation 2001  . H/O hiatal hernia   . Myocardial infarction     hx of MI   Past Surgical History  Procedure Date  . Tonsillectomy   . Acne cyst removal     2 on back   . Cardioversion 04/18/2012    Procedure: CARDIOVERSION;  Surgeon: Pricilla Riffle, MD;  Location: [facility] OR;  Service: Cardiovascular;  Laterality: N/A;  . Tee without cardioversion 04/25/2012    Procedure: TRANSESOPHAGEAL ECHOCARDIOGRAM (TEE);  Surgeon: Vesta Mixer, MD;  Location: [facility] ENDOSCOPY;  Service: Cardiovascular;  Laterality: N/A;  . Cardioversion 04/25/2012    Procedure: CARDIOVERSION;  Surgeon: Vesta Mixer, MD;  Location: [facility] ENDOSCOPY;  Service: Cardiovascular;  Laterality: N/A;  . Cardioversion 04/25/2012    Procedure: CARDIOVERSION;  Surgeon: Pricilla Riffle, MD;  Location: [facility] OR;  Service: Cardiovascular;  Laterality: N/A;  . Cardioversion 05/09/2012    Procedure: CARDIOVERSION;  Surgeon: Tonny Bollman, MD;  Location: [facility] OR;  Service: Cardiovascular;  Laterality: N/A;  changed from crenshaw to cooper by trish/leone-endo    reports that he quit smoking about 4 years ago. His smoking use included Cigarettes. He has a 60 pack-year smoking history. He has never used smokeless tobacco. He reports that he does not drink alcohol or use illicit drugs. family history includes Allergies in his mother; Asthma in his maternal grandmother and mother; COPD in his mother; and Colon polyps in his mother. Allergies  Allergen Reactions  . Simvastatin     REACTION: severe legs  . Statins Other (See Comments)    myalgia   Current  Outpatient Prescriptions on File Prior to Visit  Medication Sig Dispense Refill  . albuterol (PROVENTIL HFA;VENTOLIN HFA) 108 (90 BASE) MCG/ACT inhaler Inhale 2 puffs into the lungs every 4 (four) hours as needed. For wheezing      . ALPRAZolam (XANAX XR) 1 MG 24 hr tablet For anxiety, for sleepTake 1 mg by mouth at bedtime as needed  30 tablet  5  . amiodarone (PACERONE) 200 MG tablet Take two tablets by mouth three times a day  540 tablet  3  . carisoprodol (SOMA) 350 MG tablet TAKE 1 TABLET BY MOUTH 3 TIMES A DAY AS NEEDED  90 tablet  2  . carvedilol (COREG) 6.25 MG tablet Take 1 tablet (6.25 mg total) by mouth 2 (two) times daily with a meal.  180 tablet  3  . clobetasol cream (TEMOVATE) 0.05 % Apply 1 application topically daily.       . digoxin (LANOXIN) 0.125 MG tablet Take 1 tablet (0.125 mg total) by mouth daily. taking1/2daily  30 tablet  3  . doxepin (SINEQUAN) 25 MG capsule Take 1 capsule (25 mg total) by mouth 2 (two) times daily.  180 capsule  3  . Fluticasone-Salmeterol (ADVAIR) 250-50 MCG/DOSE AEPB Inhale 1 puff into the lungs daily.  14 each  0  . furosemide (LASIX) 40 MG tablet TAKE 1 TABLET TWICE DAILY  180 tablet  PRN  . hydrocortisone 2.5 % cream Apply 1 application topically 2 (two) times daily. As needed to facial areas only  30 g  3  . ibuprofen (ADVIL,MOTRIN) 800 MG tablet Take 800 mg by mouth every 6 (six) hours as needed.       [name] lisinopril (PRINIVIL,ZESTRIL) 2.5 MG tablet Take 1 tablet (2.5 mg total) by mouth daily.  90 tablet  3  . metFORMIN (GLUCOPHAGE) 500 MG tablet Take 500 mg by mouth 2 (two) times daily with a meal.      . omeprazole (PRILOSEC) 20 MG capsule TAKE 2 CAPSULES BY MOUTH EVERY DAY  180 capsule  2  . potassium chloride SA (K-DUR,KLOR-CON) 20 MEQ tablet Take 20 mEq by mouth daily.      [name] PROAIR HFA 108 (90 BASE) MCG/ACT inhaler INHALE 2 PUFFS INTO THE LUNGS 4 TIMES DAILY.  8.5 g  11  . sertraline (ZOLOFT) 100 MG tablet Take 200 mg by mouth daily.        . traZODone (DESYREL) 100 MG tablet Take 50 mg by mouth at bedtime.       [name] warfarin (COUMADIN) 5 MG tablet Take as directed by anticoagulation clinic2.5mg  on Monday, Wednesday, Friday 5mg  all other days      . zolpidem (AMBIEN) 10 MG tablet Take 1 tablet (10 mg total) by mouth at bedtime as needed. For sleep  30 tablet  5  . [DISCONTINUED] atorvastatin (  LIPITOR) 20 MG tablet Take 1 tablet (20 mg total) by mouth daily.  30 tablet  11  . [DISCONTINUED] gabapentin (NEURONTIN) 300 MG capsule Take 1 capsule (300 mg total) by mouth 3 (three) times daily.  90 capsule  5   Review of Systems  Constitutional: Negative for diaphoresis and unexpected weight change.  HENT: Negative for tinnitus.   Eyes: Negative for photophobia and visual disturbance.  Respiratory: Negative for choking and stridor.   Gastrointestinal: Negative for vomiting and blood in stool.  Genitourinary: Negative for hematuria and decreased urine volume.  Musculoskeletal: Negative for gait problem.  Skin: Negative for color change and wound.  Neurological: Negative for tremors and numbness.  Psychiatric/Behavioral: Negative for decreased concentration. The patient is not hyperactive.       Objective:   Physical Exam BP 110/60  Pulse 63  Temp 97.6 F (36.4 C) (Oral)  Ht 5\' 11"  (1.803 m)  Wt 283 lb 2 oz (128.425 kg)  BMI 39.49 kg/m2  SpO2 92% Physical Exam  VS noted Constitutional: Pt appears well-developed and well-nourished.  HENT: Head: Normocephalic.  Right Ear: External ear normal.  Left Ear: External ear normal.  Eyes: Conjunctivae and EOM are normal. Pupils are equal, round, and reactive to light.  Neck: Normal range of motion. Neck supple.  Cardiovascular: Normal rate and regular rhythm.   Pulmonary/Chest: Effort normal and breath sounds normal.  Abd:  Soft, NT, non-distended, + BS Neurological: Pt is alert. ? Some mild confusion, somewhat slowed mentation, cn 2-12 intact, motor/dtr intact, gait unsteady, mild  tremulous noted to UE';s Skin: Skin is warm. No erythema. No LE edema Psychiatric: Pt behavior is normal. Thought content normal. 1+ nervous    Assessment & Plan:

## 2012-09-25 NOTE — Patient Instructions (Addendum)
Continue all other medications as before Please go to LAB in the Basement for the blood and/or urine tests to be done today You will be contacted by phone if any changes need to be made immediately.  Otherwise, you will receive a letter about your results with an explanation Please remember to sign up for My Chart at your earliest convenience, as this will be important to you in the future with finding out test results. You will be contacted regarding the referral for: MRI - head, and the change of coumadin clinic to [facility]

## 2012-09-25 NOTE — Assessment & Plan Note (Addendum)
With HA constant bitemporal and general weakness, also ? Mild confusion, but also gait unsteady/balance dysfunction, tremulous, in the setting of INR < 2.0 on the coumadin and recent cardioversion; unclear etiology but cannot r/o cva  - for MRI head asap  Note:  Total time for pt hx, exam, review of record with pt in the room, determination of diagnoses and plan for further eval and tx is > 40 min, with over 50% spent in coordination and counseling of patient

## 2012-09-26 ENCOUNTER — Encounter: Payer: Self-pay | Admitting: Internal Medicine

## 2012-09-26 ENCOUNTER — Other Ambulatory Visit (INDEPENDENT_AMBULATORY_CARE_PROVIDER_SITE_OTHER): Payer: Medicare HMO

## 2012-09-26 ENCOUNTER — Telehealth: Payer: Self-pay | Admitting: *Deleted

## 2012-09-26 DIAGNOSIS — R2689 Other abnormalities of gait and mobility: Secondary | ICD-10-CM

## 2012-09-26 DIAGNOSIS — R29818 Other symptoms and signs involving the nervous system: Secondary | ICD-10-CM

## 2012-09-26 DIAGNOSIS — E119 Type 2 diabetes mellitus without complications: Secondary | ICD-10-CM

## 2012-09-26 LAB — BASIC METABOLIC PANEL
BUN: 17 mg/dL (ref 6–23)
GFR: 94.61 mL/min (ref >60.00)
Potassium: 4.3 mEq/L (ref 3.5–5.1)
Sodium: 133 mEq/L — ABNORMAL LOW (ref 135–145)

## 2012-09-26 LAB — CBC WITH DIFFERENTIAL/PLATELET
Basophils Relative: 0.6 % (ref 0.0–3.0)
Eosinophils Relative: 2.4 % (ref 0.0–5.0)
HCT: 42.7 % (ref 39.0–52.0)
Lymphs Abs: 1.2 10*3/uL (ref 0.7–4.0)
MCV: 94.2 fl (ref 78.0–100.0)
Monocytes Absolute: 0.5 10*3/uL (ref 0.1–1.0)
Neutro Abs: 3.2 10*3/uL (ref 1.4–7.7)
Platelets: 179 10*3/uL (ref 150.0–400.0)
WBC: 5 10*3/uL (ref 4.5–10.5)

## 2012-09-26 LAB — URINALYSIS, ROUTINE W REFLEX MICROSCOPIC
Ketones, ur: NEGATIVE
Leukocytes, UA: NEGATIVE
Urobilinogen, UA: 1 (ref 0.0–1.0)

## 2012-09-26 LAB — HEPATIC FUNCTION PANEL
AST: 47 U/L — ABNORMAL HIGH (ref 0–37)
Alkaline Phosphatase: 47 U/L (ref 39–117)
Total Bilirubin: 0.9 mg/dL (ref 0.3–1.2)

## 2012-09-26 LAB — LIPID PANEL
HDL: 37.8 mg/dL — ABNORMAL LOW (ref >39.00)
VLDL: 24.6 mg/dL (ref 0.0–40.0)

## 2012-09-26 LAB — TSH: TSH: 3.74 u[IU]/mL (ref 0.35–5.50)

## 2012-09-26 LAB — LDL CHOLESTEROL, DIRECT: Direct LDL: 191.6 mg/dL

## 2012-09-26 NOTE — Telephone Encounter (Signed)
MRI/Brain call report  Showed mild chronic microvascular ischemic changes in the white matter. No acute abnormalities...Raechel Chute

## 2012-09-28 ENCOUNTER — Other Ambulatory Visit: Payer: Medicare HMO

## 2012-09-30 ENCOUNTER — Telehealth: Payer: Self-pay

## 2012-09-30 NOTE — Telephone Encounter (Signed)
Called the patient and he is seen by Dr. Shelle Iron.  The patient stated he is working with a tech to help him with his mask and will schedule appointment at another time.

## 2012-09-30 NOTE — Telephone Encounter (Signed)
Message copied by Pincus Sanes on Tue Sep 30, 2012  9:42 AM ------      Message from: [name]      Created: Mon Sep 29, 2012  5:29 PM       I can refer him back to his sleep apnea MD since I dont tx this, or he really does not have one, I can refer to pulmonary/sleep medcine at [place]      ----- Message -----         From: Vladimir Crofts Ewing         Sent: 09/29/2012   1:20 PM           To: [name], MD            The patient is still having problems sleeping.  He thinks it may be due to his CPAP.  Please advise

## 2012-10-20 ENCOUNTER — Other Ambulatory Visit: Payer: Self-pay | Admitting: Emergency Medicine

## 2012-10-20 MED ORDER — POTASSIUM CHLORIDE CRYS ER 20 MEQ PO TBCR: 20.0000 meq | EXTENDED_RELEASE_TABLET | Freq: Every day | ORAL | Status: DC

## 2012-10-20 MED ORDER — DIGOXIN 125 MCG PO TABS: 0.1250 mg | ORAL_TABLET | Freq: Every day | ORAL | Status: DC

## 2012-10-20 MED ORDER — FUROSEMIDE 40 MG PO TABS: 40.0000 mg | ORAL_TABLET | Freq: Two times a day (BID) | ORAL | Status: DC

## 2012-10-21 ENCOUNTER — Ambulatory Visit (INDEPENDENT_AMBULATORY_CARE_PROVIDER_SITE_OTHER): Payer: Medicare HMO | Admitting: General Practice

## 2012-10-21 DIAGNOSIS — I4891 Unspecified atrial fibrillation: Secondary | ICD-10-CM

## 2012-10-21 DIAGNOSIS — Z7901 Long term (current) use of anticoagulants: Secondary | ICD-10-CM

## 2012-10-21 LAB — POCT INR: INR: 2.1

## 2012-10-22 ENCOUNTER — Other Ambulatory Visit: Payer: Self-pay | Admitting: Internal Medicine

## 2012-10-23 ENCOUNTER — Ambulatory Visit: Payer: Medicare HMO | Admitting: Pulmonary Disease

## 2012-11-08 ENCOUNTER — Other Ambulatory Visit: Payer: Self-pay | Admitting: Internal Medicine

## 2012-11-17 ENCOUNTER — Other Ambulatory Visit: Payer: Self-pay

## 2012-11-17 MED ORDER — CARISOPRODOL 350 MG PO TABS: ORAL_TABLET | ORAL | Status: DC

## 2012-11-17 NOTE — Telephone Encounter (Signed)
Faxed hardcopy to pharmacy. 

## 2012-11-17 NOTE — Telephone Encounter (Signed)
Done hardcopy to robin  

## 2012-11-25 ENCOUNTER — Ambulatory Visit (INDEPENDENT_AMBULATORY_CARE_PROVIDER_SITE_OTHER): Payer: Medicare HMO | Admitting: General Practice

## 2012-11-25 DIAGNOSIS — Z7901 Long term (current) use of anticoagulants: Secondary | ICD-10-CM

## 2012-11-25 DIAGNOSIS — I4891 Unspecified atrial fibrillation: Secondary | ICD-10-CM

## 2012-11-25 LAB — POCT INR: INR: 2.3

## 2012-12-04 ENCOUNTER — Other Ambulatory Visit: Payer: Self-pay | Admitting: Internal Medicine

## 2012-12-05 NOTE — Telephone Encounter (Signed)
Done erx 

## 2012-12-16 ENCOUNTER — Telehealth: Payer: Self-pay

## 2012-12-16 MED ORDER — ALPRAZOLAM 0.5 MG PO TABS: 0.5000 mg | ORAL_TABLET | Freq: Two times a day (BID) | ORAL | Status: DC | PRN

## 2012-12-16 NOTE — Telephone Encounter (Signed)
Patient received letter from [facility] and he needs to change Alprazolam to regular tabs as the current one on his list will cost him $79 a month.  Please [name]

## 2012-12-16 NOTE — Telephone Encounter (Signed)
Done hardcopy to robin  

## 2012-12-17 NOTE — Telephone Encounter (Signed)
Patient informed and faxed to [facility]. Per pt. Request.

## 2012-12-17 NOTE — Telephone Encounter (Signed)
Called the patient left message to call back 

## 2012-12-18 ENCOUNTER — Other Ambulatory Visit: Payer: Self-pay | Admitting: Rheumatology

## 2012-12-18 ENCOUNTER — Ambulatory Visit
Admission: RE | Admit: 2012-12-18 | Discharge: 2012-12-18 | Disposition: A | Discharge status: Home / Self Care | Payer: Medicare HMO | Source: Ambulatory Visit | Attending: Rheumatology | Admitting: Rheumatology

## 2012-12-18 DIAGNOSIS — M545 Low back pain, unspecified: Secondary | ICD-10-CM

## 2012-12-18 DIAGNOSIS — M549 Dorsalgia, unspecified: Secondary | ICD-10-CM

## 2012-12-22 ENCOUNTER — Telehealth: Payer: Self-pay

## 2012-12-22 MED ORDER — IBUPROFEN 800 MG PO TABS: 800.0000 mg | ORAL_TABLET | Freq: Four times a day (QID) | ORAL | Status: DC | PRN

## 2012-12-22 NOTE — Telephone Encounter (Signed)
refill 

## 2013-01-02 ENCOUNTER — Ambulatory Visit (INDEPENDENT_AMBULATORY_CARE_PROVIDER_SITE_OTHER): Payer: Medicare HMO | Admitting: General Practice

## 2013-01-02 DIAGNOSIS — Z7901 Long term (current) use of anticoagulants: Secondary | ICD-10-CM

## 2013-01-02 DIAGNOSIS — I4891 Unspecified atrial fibrillation: Secondary | ICD-10-CM

## 2013-01-02 LAB — POCT INR: INR: 1.6

## 2013-01-06 ENCOUNTER — Other Ambulatory Visit: Payer: Self-pay

## 2013-01-06 MED ORDER — ZOLPIDEM TARTRATE 10 MG PO TABS: 10.0000 mg | ORAL_TABLET | Freq: Every evening | ORAL | Status: DC | PRN

## 2013-01-06 NOTE — Telephone Encounter (Signed)
Faxed hardcopy to pharmacy. 

## 2013-01-06 NOTE — Telephone Encounter (Signed)
Done hardcopy to robin  

## 2013-01-13 ENCOUNTER — Other Ambulatory Visit: Payer: Self-pay | Admitting: Internal Medicine

## 2013-01-16 ENCOUNTER — Telehealth: Payer: Self-pay | Admitting: Internal Medicine

## 2013-01-16 ENCOUNTER — Ambulatory Visit (INDEPENDENT_AMBULATORY_CARE_PROVIDER_SITE_OTHER): Payer: Medicare HMO | Admitting: General Practice

## 2013-01-16 DIAGNOSIS — I4891 Unspecified atrial fibrillation: Secondary | ICD-10-CM

## 2013-01-16 DIAGNOSIS — Z7901 Long term (current) use of anticoagulants: Secondary | ICD-10-CM

## 2013-01-16 DIAGNOSIS — G894 Chronic pain syndrome: Secondary | ICD-10-CM

## 2013-01-16 LAB — POCT INR: INR: 1.9

## 2013-01-16 NOTE — Telephone Encounter (Signed)
Done per emr 

## 2013-01-16 NOTE — Telephone Encounter (Signed)
Pt called req referral for pain clinic, pt prefer [facility]. Please advise

## 2013-01-16 NOTE — Telephone Encounter (Signed)
Patient informed left detailed message 

## 2013-01-17 ENCOUNTER — Other Ambulatory Visit: Payer: Self-pay | Admitting: Internal Medicine

## 2013-01-26 ENCOUNTER — Other Ambulatory Visit: Payer: Self-pay

## 2013-01-26 MED ORDER — SERTRALINE HCL 100 MG PO TABS: 200.0000 mg | ORAL_TABLET | Freq: Every day | ORAL | Status: DC

## 2013-01-27 ENCOUNTER — Telehealth: Payer: Self-pay

## 2013-01-27 DIAGNOSIS — G894 Chronic pain syndrome: Secondary | ICD-10-CM

## 2013-01-27 NOTE — Telephone Encounter (Signed)
Referral done

## 2013-01-27 NOTE — Telephone Encounter (Addendum)
The patient received a call from [facility] and they do not take his insurance [facility]) please refer to another pain management.

## 2013-02-10 ENCOUNTER — Other Ambulatory Visit: Payer: Self-pay | Admitting: Internal Medicine

## 2013-02-10 ENCOUNTER — Encounter: Payer: Self-pay | Admitting: Physical Medicine & Rehabilitation

## 2013-02-11 ENCOUNTER — Encounter: Payer: Self-pay | Admitting: Physical Medicine & Rehabilitation

## 2013-02-11 ENCOUNTER — Encounter: Payer: Medicare HMO | Attending: Physical Medicine & Rehabilitation | Admitting: Physical Medicine & Rehabilitation

## 2013-02-11 VITALS — BP 126/63 | HR 54 | Resp 16 | Ht 70.0 in | Wt 267.0 lb

## 2013-02-11 DIAGNOSIS — E669 Obesity, unspecified: Secondary | ICD-10-CM | POA: Insufficient documentation

## 2013-02-11 DIAGNOSIS — Z79899 Other long term (current) drug therapy: Secondary | ICD-10-CM

## 2013-02-11 DIAGNOSIS — M47817 Spondylosis without myelopathy or radiculopathy, lumbosacral region: Secondary | ICD-10-CM | POA: Insufficient documentation

## 2013-02-11 DIAGNOSIS — M1710 Unilateral primary osteoarthritis, unspecified knee: Secondary | ICD-10-CM | POA: Insufficient documentation

## 2013-02-11 DIAGNOSIS — IMO0002 Reserved for concepts with insufficient information to code with codable children: Secondary | ICD-10-CM

## 2013-02-11 DIAGNOSIS — Z5181 Encounter for therapeutic drug level monitoring: Secondary | ICD-10-CM

## 2013-02-11 DIAGNOSIS — M545 Low back pain, unspecified: Secondary | ICD-10-CM | POA: Insufficient documentation

## 2013-02-11 DIAGNOSIS — M4180 Other forms of scoliosis, site unspecified: Secondary | ICD-10-CM | POA: Insufficient documentation

## 2013-02-11 DIAGNOSIS — M47814 Spondylosis without myelopathy or radiculopathy, thoracic region: Secondary | ICD-10-CM

## 2013-02-11 DIAGNOSIS — G8929 Other chronic pain: Secondary | ICD-10-CM | POA: Insufficient documentation

## 2013-02-11 DIAGNOSIS — IMO0001 Reserved for inherently not codable concepts without codable children: Secondary | ICD-10-CM

## 2013-02-11 DIAGNOSIS — M2550 Pain in unspecified joint: Secondary | ICD-10-CM

## 2013-02-11 DIAGNOSIS — M1712 Unilateral primary osteoarthritis, left knee: Secondary | ICD-10-CM | POA: Insufficient documentation

## 2013-02-11 DIAGNOSIS — M546 Pain in thoracic spine: Secondary | ICD-10-CM | POA: Insufficient documentation

## 2013-02-11 MED ORDER — DICLOFENAC SODIUM 1 % TD GEL: 1.0000 "application " | Freq: Four times a day (QID) | TRANSDERMAL | Status: DC

## 2013-02-11 MED ORDER — HYDROCODONE-ACETAMINOPHEN 5-325 MG PO TABS: 1.0000 | ORAL_TABLET | Freq: Four times a day (QID) | ORAL | Status: DC | PRN

## 2013-02-11 NOTE — Telephone Encounter (Signed)
Faxed hardcopy to pharmacy. 

## 2013-02-11 NOTE — Patient Instructions (Addendum)
CALL ME WITH ANY PROBLEMS OR QUESTIONS (#[phone]).  HAVE A GOOD DAY  STOP ASPIRIN AND IBUPROFEN

## 2013-02-11 NOTE — Progress Notes (Signed)
Subjective:    Patient ID: [name], male    DOB: [birth year], 57 y.o.   MRN: [id number]  HPI  This is an initial evaluation for [name] who was referred to this clinic by Dr. [name].  He  complains of multiple areas of diffuse pain. He was diagnosed by Dr. Kellie Simmering last year with RA.  His pain started about 14 years ago. He last worked in 2008. His back forced him out of work. He used to be a Education administrator. Additionally he's developed pain in his shoulders and left knee. He's had numerous injections apparently through [facility] system.  He has tried to stay as active as possible and goes to the [facility] as much as he can. He has lost 86lb since January which has helped his energy. He is currently 267lbs. His pain doesn't seem to have improved a great deal, however. Generally the more he moves the more he hurts, especially at night.   For pain he takes ibuprofen 800mg  two to three x per day. He has soma, but it doesn't help. He has tramadol available but he finds an aspirin "works better." He has noticed bruising along his arms over the last few months.   He had a lumbar and thoracic xray done last month which revealed: Narrowing of multiple intervertebral disc spaces. Changes of  degenerative spondylosis. No pars defects. Slight posterior  slippage of body of L1 on the body of tube in the body of L3 on the  body of L4. The thoracic films showed generalized, lower thoracic disc disease with calcification.     Pain Inventory Average Pain 9 Pain Right Now 7 My pain is sharp, stabbing and aching  In the last 24 hours, has pain interfered with the following? General activity 7 Relation with others 5 Enjoyment of life 8 What TIME of day is your pain at its worst? evening,night time Sleep (in general) Poor  Pain is worse with: sitting Pain improves with: rest and injections Relief from Meds: 1  Mobility use a cane how many minutes can you walk? 30 min. ability to climb  steps?  no do you drive?  yes needs help with transfers Do you have any goals in this area?  yes  Function disabled: date disabled 2009 Do you have any goals in this area?  yes  Neuro/Psych weakness tremor trouble walking dizziness depression anxiety  Prior Studies Any changes since last visit?  yes x-rays CT/MRI  Physicians involved in your care Any changes since last visit?  yes Primary care Dr [name]   Family History  Problem Relation Age of Onset  . COPD Mother   . Asthma Mother   . Colon polyps Mother   . Allergies Mother   . Asthma Maternal Grandmother    History   Social History  . Marital Status: Married    Spouse Name: N/A    Number of Children: 2  . Years of Education: N/A   Occupational History  . retired/disabled. prev worked in Teacher, English as a foreign language.    Social History Main Topics  . Smoking status: Former Smoker -- 2.00 packs/day for 30 years    Types: Cigarettes    Quit date: 10/16/2007  . Smokeless tobacco: Never Used  . Alcohol Use: No  . Drug Use: No  . Sexually Active: None   Other Topics Concern  . None   Social History Narrative   Lives alone.   Past Surgical History  Procedure Laterality Date  .  Tonsillectomy    . Acne cyst removal      2 on back   . Cardioversion  04/18/2012    Procedure: CARDIOVERSION;  Surgeon: Pricilla Riffle, MD;  Location: [facility] OR;  Service: Cardiovascular;  Laterality: N/A;  . Tee without cardioversion  04/25/2012    Procedure: TRANSESOPHAGEAL ECHOCARDIOGRAM (TEE);  Surgeon: Vesta Mixer, MD;  Location: [facility] ENDOSCOPY;  Service: Cardiovascular;  Laterality: N/A;  . Cardioversion  04/25/2012    Procedure: CARDIOVERSION;  Surgeon: Vesta Mixer, MD;  Location: [facility] ENDOSCOPY;  Service: Cardiovascular;  Laterality: N/A;  . Cardioversion  04/25/2012    Procedure: CARDIOVERSION;  Surgeon: Pricilla Riffle, MD;  Location: [facility] OR;  Service: Cardiovascular;  Laterality: N/A;  . Cardioversion  05/09/2012    Procedure:  CARDIOVERSION;  Surgeon: Tonny Bollman, MD;  Location: [facility] OR;  Service: Cardiovascular;  Laterality: N/A;  changed from crenshaw to cooper by trish/leone-endo   Past Medical History  Diagnosis Date  . ALLERGIC RHINITIS   . ANXIETY   . ASTHMA   . Chronic pain syndrome   . BENIGN PROSTATIC HYPERTROPHY   . DEPRESSION   . DIABETES MELLITUS, TYPE II   . DYSKINESIA, ESOPHAGUS   . FIBROMYALGIA   . GERD   . HYPERLIPIDEMIA   . HYPERTENSION   . INSOMNIA   . LOW BACK PAIN   . Morbid obesity     Anticipated LAP-BAND surgery  . NEPHROLITHIASIS, HX OF   . OBSESSIVE-COMPULSIVE DISORDER   . ORCHITIS   . PERIPHERAL EDEMA   . PLANTAR FASCIITIS, BILATERAL   . POLYARTHRALGIA   . Rash and other nonspecific skin eruption 04/12/2009  . SLEEP APNEA, OBSTRUCTIVE   . Atrial fibrillation     Initially identified 2001, recurring 2013  . Cardiomyopathy     Myoview with prior infarct- 2012; EF 25% TEE July 2013  . Syncope     Associated with atrial fibrillation 2001  . H/O hiatal hernia   . Myocardial infarction     hx of MI   BP 126/63  Pulse 54  Resp 16  Ht 5\' 10"  (1.778 m)  Wt 267 lb (121.11 kg)  BMI 38.31 kg/m2  SpO2 95%     Review of Systems  Constitutional: Positive for unexpected weight change.  Respiratory: Positive for apnea and wheezing.   Gastrointestinal: Positive for nausea, abdominal pain and constipation.  Musculoskeletal: Positive for gait problem.  Skin: Positive for rash.  Neurological: Positive for dizziness, tremors and weakness.  Psychiatric/Behavioral: Positive for dysphoric mood and agitation.  All other systems reviewed and are negative.       Objective:   Physical Exam  General: Alert and oriented x 3, No apparent distress. obese HEENT: Head is normocephalic, atraumatic, PERRLA, EOMI, sclera anicteric, oral mucosa pink and moist, dentition intact, ext ear canals clear,  Neck: Supple without JVD or lymphadenopathy Heart: Reg rate and rhythm. No murmurs  rubs or gallops  Chest: CTA bilaterally without wheezes, rales, or rhonchi; no distress Abdomen: Soft, non-tender, non-distended, bowel sounds positive. Extremities: No clubbing, cyanosis, or edema. Pulses are 2+ Skin: Clean and intact without signs of breakdown.  A few scars noted over mid back. Multiple bruises and ecchymoses over the arms. Neuro: Pt is cognitively appropriate with normal insight, memory, and awareness. Cranial nerves 2-12 are intact. Sensory exam is normal. Reflexes are 2+ in all 4's. Fine motor coordination is intact. No tremors. Motor function is grossly 5/5.  Musculoskeletal: elevation or right hemipelvis. Levoscoliosis from  mid lumbar to mid thoracic spine. Taut musculature over lower 2/3 of right thoracic paraspsinals. Pain worse with extension and lateral bending to the right. Some dicomfort also with flexion and rotation. Left knee tender with meniscal maneuvers, lateral more than medial. Mild crepitus left knee. No significant edema no instability. Some antalgia with weight bearing on the left. Bilateral shoulders mildly tender with ER and IR. No gross joint deformities seen. Grip strength slightly reduced bilaterally. Posture fair, somewhat of a flexed lumbar position at rest however.  Psych: Pt's affect is appropriate. Pt is cooperative         Assessment & Plan:  1. Chronic thoracic and lumbar spine pain. 2. Myofascial pain 3. Lumbar-thoracic scoliosis 4. ?RA- dxed by rheumatologist.  5. Obesity 6. OA of the bilateral knees, left more than right.    Plan: 1.Voltaren gel for knees, back, shoulders. STOP ORAL NSAIDS 2. Consider TPI's when he comes off NSAID's 3. Will refer to outpt PT to address posture, strengthening, modailities, pilates based principles, and  To set up on a HEP,  4. Hydrocodone for breakthrough pain. 5. Follow up here next month. One hour of face to face patient care time was spent during this visit. All questions were encouraged and  answered.  I would like to thank Dr. Jonny Ruiz for the opportunity to take part in the care of this nice gentleman!

## 2013-02-11 NOTE — Telephone Encounter (Signed)
Done hardcopy to robin  

## 2013-02-13 ENCOUNTER — Telehealth: Payer: Self-pay | Admitting: *Deleted

## 2013-02-13 NOTE — Telephone Encounter (Signed)
Left message on personal email with Dr Rosalyn Charters suggestions.

## 2013-02-13 NOTE — Telephone Encounter (Signed)
Called about the Voltaren Gel Dr Riley Kill ordered.  It is going to cost him $95.  Is there anything else he can use?

## 2013-02-13 NOTE — Telephone Encounter (Signed)
He could try "Salonpos" arthritis patch. There are also numerous creams/rubs OTC which could be tried. Also would suggest glucosamine with chondroitin caps/tabs

## 2013-02-17 ENCOUNTER — Ambulatory Visit (INDEPENDENT_AMBULATORY_CARE_PROVIDER_SITE_OTHER): Payer: Medicare HMO | Admitting: General Practice

## 2013-02-17 DIAGNOSIS — Z7901 Long term (current) use of anticoagulants: Secondary | ICD-10-CM

## 2013-02-17 DIAGNOSIS — I4891 Unspecified atrial fibrillation: Secondary | ICD-10-CM

## 2013-02-19 ENCOUNTER — Other Ambulatory Visit: Payer: Self-pay | Admitting: Internal Medicine

## 2013-02-19 ENCOUNTER — Ambulatory Visit: Payer: Medicare HMO | Admitting: Physical Therapy

## 2013-03-10 ENCOUNTER — Other Ambulatory Visit: Payer: Self-pay | Admitting: *Deleted

## 2013-03-10 DIAGNOSIS — M2550 Pain in unspecified joint: Secondary | ICD-10-CM

## 2013-03-10 DIAGNOSIS — IMO0001 Reserved for inherently not codable concepts without codable children: Secondary | ICD-10-CM

## 2013-03-10 DIAGNOSIS — M1712 Unilateral primary osteoarthritis, left knee: Secondary | ICD-10-CM

## 2013-03-10 DIAGNOSIS — M47817 Spondylosis without myelopathy or radiculopathy, lumbosacral region: Secondary | ICD-10-CM

## 2013-03-10 DIAGNOSIS — M47814 Spondylosis without myelopathy or radiculopathy, thoracic region: Secondary | ICD-10-CM

## 2013-03-10 MED ORDER — HYDROCODONE-ACETAMINOPHEN 5-325 MG PO TABS: 1.0000 | ORAL_TABLET | Freq: Four times a day (QID) | ORAL | Status: DC | PRN

## 2013-03-10 NOTE — Telephone Encounter (Signed)
Hydrocodone refilled and pt notified.

## 2013-03-13 ENCOUNTER — Encounter: Payer: Medicare HMO | Admitting: Physical Medicine & Rehabilitation

## 2013-03-17 ENCOUNTER — Ambulatory Visit (INDEPENDENT_AMBULATORY_CARE_PROVIDER_SITE_OTHER): Payer: Medicare HMO | Admitting: Family Medicine

## 2013-03-17 DIAGNOSIS — Z7901 Long term (current) use of anticoagulants: Secondary | ICD-10-CM

## 2013-03-17 DIAGNOSIS — I4891 Unspecified atrial fibrillation: Secondary | ICD-10-CM

## 2013-03-17 LAB — POCT INR: INR: 2.7

## 2013-03-20 ENCOUNTER — Other Ambulatory Visit: Payer: Self-pay | Admitting: Internal Medicine

## 2013-03-23 NOTE — Telephone Encounter (Signed)
Done hardcopy to robin  

## 2013-03-23 NOTE — Telephone Encounter (Signed)
Faxed hardcopy to [facility] [address] [place]

## 2013-03-25 ENCOUNTER — Encounter: Payer: Self-pay | Admitting: Physical Medicine & Rehabilitation

## 2013-03-25 ENCOUNTER — Encounter: Payer: Medicare HMO | Attending: Physical Medicine & Rehabilitation | Admitting: Physical Medicine & Rehabilitation

## 2013-03-25 VITALS — BP 100/52 | HR 54 | Resp 15 | Ht 70.0 in | Wt 257.0 lb

## 2013-03-25 DIAGNOSIS — M1712 Unilateral primary osteoarthritis, left knee: Secondary | ICD-10-CM

## 2013-03-25 DIAGNOSIS — M47817 Spondylosis without myelopathy or radiculopathy, lumbosacral region: Secondary | ICD-10-CM

## 2013-03-25 DIAGNOSIS — M1710 Unilateral primary osteoarthritis, unspecified knee: Secondary | ICD-10-CM

## 2013-03-25 DIAGNOSIS — IMO0002 Reserved for concepts with insufficient information to code with codable children: Secondary | ICD-10-CM

## 2013-03-25 DIAGNOSIS — M47814 Spondylosis without myelopathy or radiculopathy, thoracic region: Secondary | ICD-10-CM | POA: Insufficient documentation

## 2013-03-25 DIAGNOSIS — M2550 Pain in unspecified joint: Secondary | ICD-10-CM | POA: Insufficient documentation

## 2013-03-25 DIAGNOSIS — IMO0001 Reserved for inherently not codable concepts without codable children: Secondary | ICD-10-CM

## 2013-03-25 MED ORDER — HYDROCODONE-ACETAMINOPHEN 5-325 MG PO TABS: 1.0000 | ORAL_TABLET | Freq: Four times a day (QID) | ORAL | Status: DC | PRN

## 2013-03-25 NOTE — Progress Notes (Signed)
Subjective:    Patient ID: [name], male    DOB: 11/02/1955, 57 y.o.   MRN: [id number]  HPI  [name] is back regarding his chronic pain. He was not able to afford the copay for the voltaren or the therapy. He still goes to the [facility] 3 days per week or so.   The heating pad works well as does Research officer, political [employer].   The hydrocodone gives him some relief, but he gets constipated and limits the amount. He has been on fentanyl patches in the past as well.   He has continued taking ibuprofen despite my recommendations to decrease/stop. Typically he's taking one-to two per day.     Pain Inventory Average Pain 8 Pain Right Now 8 My pain is sharp, stabbing and aching  In the last 24 hours, has pain interfered with the following? General activity 5 Relation with others 5 Enjoyment of life 5 What TIME of day is your pain at its worst? evening,night Sleep (in general) Fair  Pain is worse with: sitting Pain improves with: na Relief from Meds: 2  Mobility walk without assistance how many minutes can you walk? 25 ability to climb steps?  no do you drive?  yes  Function disabled: date disabled na Do you have any goals in this area?  yes  Neuro/Psych numbness tingling trouble walking anxiety  Prior Studies Any changes since last visit?  no  Physicians involved in your care Any changes since last visit?  no   Family History  Problem Relation Age of Onset  . COPD Mother   . Asthma Mother   . Colon polyps Mother   . Allergies Mother   . Asthma Maternal Grandmother    History   Social History  . Marital Status: Married    Spouse Name: N/A    Number of Children: 2  . Years of Education: N/A   Occupational History  . retired/disabled. prev worked in Teacher, English as a foreign language.    Social History Main Topics  . Smoking status: Former Smoker -- 2.00 packs/day for 30 years    Types: Cigarettes    Quit date: 10/16/2007  . Smokeless tobacco: Never Used  . Alcohol Use: No  . Drug  Use: No  . Sexually Active: None   Other Topics Concern  . None   Social History Narrative   Lives alone.   Past Surgical History  Procedure Laterality Date  . Tonsillectomy    . Acne cyst removal      2 on back   . Cardioversion  04/18/2012    Procedure: CARDIOVERSION;  Surgeon: Pricilla Riffle, MD;  Location: [facility] OR;  Service: Cardiovascular;  Laterality: N/A;  . Tee without cardioversion  04/25/2012    Procedure: TRANSESOPHAGEAL ECHOCARDIOGRAM (TEE);  Surgeon: Vesta Mixer, MD;  Location: [facility] ENDOSCOPY;  Service: Cardiovascular;  Laterality: N/A;  . Cardioversion  04/25/2012    Procedure: CARDIOVERSION;  Surgeon: Vesta Mixer, MD;  Location: [facility] ENDOSCOPY;  Service: Cardiovascular;  Laterality: N/A;  . Cardioversion  04/25/2012    Procedure: CARDIOVERSION;  Surgeon: Pricilla Riffle, MD;  Location: [facility] OR;  Service: Cardiovascular;  Laterality: N/A;  . Cardioversion  05/09/2012    Procedure: CARDIOVERSION;  Surgeon: Tonny Bollman, MD;  Location: [facility] OR;  Service: Cardiovascular;  Laterality: N/A;  changed from crenshaw to cooper by trish/leone-endo   Past Medical History  Diagnosis Date  . ALLERGIC RHINITIS   . ANXIETY   . ASTHMA   . Chronic pain syndrome   .  BENIGN PROSTATIC HYPERTROPHY   . DEPRESSION   . DIABETES MELLITUS, TYPE II   . DYSKINESIA, ESOPHAGUS   . FIBROMYALGIA   . GERD   . HYPERLIPIDEMIA   . HYPERTENSION   . INSOMNIA   . LOW BACK PAIN   . Morbid obesity     Anticipated LAP-BAND surgery  . NEPHROLITHIASIS, HX OF   . OBSESSIVE-COMPULSIVE DISORDER   . ORCHITIS   . PERIPHERAL EDEMA   . PLANTAR FASCIITIS, BILATERAL   . POLYARTHRALGIA   . Rash and other nonspecific skin eruption 04/12/2009  . SLEEP APNEA, OBSTRUCTIVE   . Atrial fibrillation     Initially identified 2001, recurring 2013  . Cardiomyopathy     Myoview with prior infarct- 2012; EF 25% TEE July 2013  . Syncope     Associated with atrial fibrillation 2001  . H/O hiatal hernia   . Myocardial  infarction     hx of MI   BP 100/52  Pulse 54  Resp 15  Ht 5\' 10"  (1.778 m)  Wt 257 lb (116.574 kg)  BMI 36.88 kg/m2  SpO2 90%      Review of Systems  Constitutional: Positive for unexpected weight change.  Respiratory: Positive for apnea and wheezing.   Gastrointestinal: Positive for nausea, diarrhea and constipation.  Musculoskeletal: Positive for gait problem.  Skin: Positive for rash.  Neurological: Positive for numbness.       Tingling  Psychiatric/Behavioral: Positive for agitation.  All other systems reviewed and are negative.       Objective:   Physical Exam  General: Alert and oriented x 3, No apparent distress. obese  HEENT: Head is normocephalic, atraumatic, PERRLA, EOMI, sclera anicteric, oral mucosa pink and moist, dentition intact, ext ear canals clear,  Neck: Supple without JVD or lymphadenopathy  Heart: Reg rate and rhythm. No murmurs rubs or gallops   Chest: CTA bilaterally without wheezes, rales, or rhonchi; no distress  Abdomen: Soft, non-tender, non-distended, bowel sounds positive.  Extremities: No clubbing, cyanosis, or edema. Pulses are 2+  Skin: Clean and intact without signs of breakdown. A few scars noted over mid back. Multiple bruises and ecchymoses over the arms.  Neuro: Pt is cognitively appropriate with normal insight, memory, and awareness. Cranial nerves 2-12 are intact. Sensory exam is normal. Reflexes are 2+ in all 4's. Fine motor coordination is intact. No tremors. Motor function is grossly 5/5.  Musculoskeletal: elevation or right hemipelvis. Levoscoliosis from mid lumbar to mid thoracic spine. Taut musculature over lower 2/3 of right thoracic paraspsinals. TP's in right periscapular and left periscapular.  Pain worse with extension and lateral bending to the right. discomfort also with flexion and rotation. Left knee tender with meniscal maneuvers, lateral more than medial. Mild crepitus left knee. No significant edema no instability. Some  antalgia with weight bearing on the left. Bilateral shoulders mildly tender with ER and IR. No gross joint deformities seen. Grip strength slightly reduced bilaterally. Posture fair, somewhat of a flexed lumbar position at rest however.  Psych: Pt's affect is appropriate. Pt is cooperative   Assessment & Plan:   1. Chronic thoracic and lumbar spine pain.  2. Myofascial pain  3. Lumbar-thoracic scoliosis  4. ?RA- dxed by rheumatologist.  5. Obesity  6. OA of the bilateral knees, left more than right.    Plan:  1. Encouraged use of his pool at the [facility] for exercise. He will seek out PT to work on posture and HEP. 2. After informed consent and preparation of  the skin with betadine and isopropyl alcohol, I injected 1.5 cc x5 of 1% lidocaine into the into two left periscapular TP's, one right periscapular TP, 2 right thoracic paraspinal TP. The patient tolerated well, and no complications were encountered. Afterward the area was cleaned and dressed. Post- injection instructions were provided.   3. Reviewed posture, pilates packet was given. 4. Hydrocodone for breakthrough pain. Encouraged a little more liberal use-paying close attention to stool consistency.  5. Follow up here in 2 month.30 minutes of face to face patient care time was spent during this visit. All questions were encouraged and answered.

## 2013-03-25 NOTE — Patient Instructions (Signed)
WORK ON POSTURE, POSTURE, POSTURE!!!! GET IN THE POOL!!!!!

## 2013-04-08 ENCOUNTER — Other Ambulatory Visit: Payer: Self-pay | Admitting: Internal Medicine

## 2013-04-20 ENCOUNTER — Other Ambulatory Visit: Payer: Self-pay | Admitting: Physical Medicine & Rehabilitation

## 2013-04-28 ENCOUNTER — Ambulatory Visit (INDEPENDENT_AMBULATORY_CARE_PROVIDER_SITE_OTHER): Payer: Medicare HMO | Admitting: General Practice

## 2013-04-28 DIAGNOSIS — I4891 Unspecified atrial fibrillation: Secondary | ICD-10-CM

## 2013-04-28 DIAGNOSIS — Z7901 Long term (current) use of anticoagulants: Secondary | ICD-10-CM

## 2013-04-30 ENCOUNTER — Telehealth: Payer: Self-pay | Admitting: Internal Medicine

## 2013-04-30 NOTE — Telephone Encounter (Signed)
I spoke with the patient regarding his complaint of edema. He reports he has right greater than left swelling to his foot for the last 2 weeks. He has been living in a camper to try to "de-stress." Due to this lifestyle, he has been eating out of a can quite a bit and eating peanut butter crackers. He is on lasix 40 mg BID. He had his INR checked with his PCP on Tuesday and was 4.2. Coumadin held x 2 days. He was instructed to double his lasix and call our office. He took 80 mg BID yesterday and this morning. He has been urinating more. I advised him to double lasix through tomorrow and take in extra potassium in his diet. He will resume normal dosing on Saturday. He will call us back on Monday if no change or worsening symptoms. He denies increased SOB. He has been instructed to change his current diet to eliminate canned foods.  He is overdue for follow up. I have scheduled him to see Dr. Graciela Husbands back on 05/25/13 at 3:15 pm. The patient is agreeable and voices understanding.

## 2013-04-30 NOTE — Telephone Encounter (Signed)
New problem   Pt is having legs, feet and ankle swelling for a couple weeks. Please call pt

## 2013-05-04 ENCOUNTER — Other Ambulatory Visit (HOSPITAL_COMMUNITY): Payer: Self-pay | Admitting: Cardiology

## 2013-05-05 ENCOUNTER — Other Ambulatory Visit: Payer: Self-pay | Admitting: Internal Medicine

## 2013-05-06 ENCOUNTER — Other Ambulatory Visit: Payer: Self-pay | Admitting: Internal Medicine

## 2013-05-06 NOTE — Telephone Encounter (Signed)
Done hardcopy to robin  

## 2013-05-06 NOTE — Telephone Encounter (Signed)
Refill for Digoxin 0.25 mg one tab daily #30, R-4 [facility].  Pt's chart/last refill (10/20/12) says 0.125 mg one tab daily - pt taking 1/2 tab daily #45, R-3 (meaning that would be enough for one year). Called pt to confirm dosage but was unable to reach. Please review and make decision on refill. Thanks.

## 2013-05-06 NOTE — Telephone Encounter (Signed)
Faxed hardcopy to [facility] [address] [place] 

## 2013-05-07 NOTE — Telephone Encounter (Signed)
Patient called back. Call parked. Patient hung up prior to me picking up the call. I left a message for the patient to call back.

## 2013-05-07 NOTE — Telephone Encounter (Signed)
I left a message for the patient to call and clarify what mg of tablet he has and if he is taking a 1/2 tablet or whole tablet.

## 2013-05-08 ENCOUNTER — Encounter: Payer: Self-pay | Admitting: Physical Medicine & Rehabilitation

## 2013-05-08 ENCOUNTER — Encounter: Payer: Medicare HMO | Attending: Physical Medicine & Rehabilitation | Admitting: Physical Medicine & Rehabilitation

## 2013-05-08 VITALS — BP 110/53 | HR 54 | Resp 14 | Ht 70.0 in | Wt 275.0 lb

## 2013-05-08 DIAGNOSIS — M47817 Spondylosis without myelopathy or radiculopathy, lumbosacral region: Secondary | ICD-10-CM | POA: Insufficient documentation

## 2013-05-08 DIAGNOSIS — M47814 Spondylosis without myelopathy or radiculopathy, thoracic region: Secondary | ICD-10-CM | POA: Insufficient documentation

## 2013-05-08 DIAGNOSIS — M1712 Unilateral primary osteoarthritis, left knee: Secondary | ICD-10-CM

## 2013-05-08 DIAGNOSIS — M1710 Unilateral primary osteoarthritis, unspecified knee: Secondary | ICD-10-CM

## 2013-05-08 DIAGNOSIS — IMO0002 Reserved for concepts with insufficient information to code with codable children: Secondary | ICD-10-CM

## 2013-05-08 MED ORDER — HYDROCODONE-ACETAMINOPHEN 7.5-325 MG PO TABS: 1.0000 | ORAL_TABLET | Freq: Four times a day (QID) | ORAL | Status: DC | PRN

## 2013-05-08 NOTE — Progress Notes (Signed)
Subjective:    Patient ID: [name], male    DOB: September 16, 1956, 57 y.o.   MRN: [id number]  HPI  [name] is back regarding his chronic back and leg pain. His pain remains about the same. He had 2 weeks of relief with the TPI's i performed 2 months ago. He continues to have more pain at night. The majority of his pain is in the mid thoracic area and around the right scapula. His hydrocodone helps a bit for pain relief. He generally uses this 2-3 times per day. The soma doesn't seem to benefit him much any more however.   He and his wife have separated, and he is living in a camper apparently at this time.   Pain Inventory Average Pain 8 Pain Right Now 6 My pain is sharp and stabbing  In the last 24 hours, has pain interfered with the following? General activity 4 Relation with others 5 Enjoyment of life 3 What TIME of day is your pain at its worst? evening Sleep (in general) Fair  Pain is worse with: walking, bending and sitting Pain improves with: rest, heat/ice and medication Relief from Meds: 4  Mobility walk with assistance how many minutes can you walk? 30 ability to climb steps?  no do you drive?  yes Do you have any goals in this area?  yes  Function disabled: date disabled 2014  Neuro/Psych bladder control problems tingling trouble walking  Prior Studies Any changes since last visit?  yes  Physicians involved in your care Dr Jonny Ruiz Dr Graciela Husbands   Family History  Problem Relation Age of Onset  . COPD Mother   . Asthma Mother   . Colon polyps Mother   . Allergies Mother   . Asthma Maternal Grandmother    History   Social History  . Marital Status: Married    Spouse Name: N/A    Number of Children: 2  . Years of Education: N/A   Occupational History  . retired/disabled. prev worked in Teacher, English as a foreign language.    Social History Main Topics  . Smoking status: Former Smoker -- 2.00 packs/day for 30 years    Types: Cigarettes    Quit date: 10/16/2007  .  Smokeless tobacco: Never Used  . Alcohol Use: No  . Drug Use: No  . Sexually Active: None   Other Topics Concern  . None   Social History Narrative   Lives alone.   Past Surgical History  Procedure Laterality Date  . Tonsillectomy    . Acne cyst removal      2 on back   . Cardioversion  04/18/2012    Procedure: CARDIOVERSION;  Surgeon: Pricilla Riffle, MD;  Location: [facility] OR;  Service: Cardiovascular;  Laterality: N/A;  . Tee without cardioversion  04/25/2012    Procedure: TRANSESOPHAGEAL ECHOCARDIOGRAM (TEE);  Surgeon: Vesta Mixer, MD;  Location: [facility] ENDOSCOPY;  Service: Cardiovascular;  Laterality: N/A;  . Cardioversion  04/25/2012    Procedure: CARDIOVERSION;  Surgeon: Vesta Mixer, MD;  Location: [facility] ENDOSCOPY;  Service: Cardiovascular;  Laterality: N/A;  . Cardioversion  04/25/2012    Procedure: CARDIOVERSION;  Surgeon: Pricilla Riffle, MD;  Location: [facility] OR;  Service: Cardiovascular;  Laterality: N/A;  . Cardioversion  05/09/2012    Procedure: CARDIOVERSION;  Surgeon: Tonny Bollman, MD;  Location: [facility] OR;  Service: Cardiovascular;  Laterality: N/A;  changed from crenshaw to cooper by trish/leone-endo   Past Medical History  Diagnosis Date  . ALLERGIC RHINITIS   .  ANXIETY   . ASTHMA   . Chronic pain syndrome   . BENIGN PROSTATIC HYPERTROPHY   . DEPRESSION   . DIABETES MELLITUS, TYPE II   . DYSKINESIA, ESOPHAGUS   . FIBROMYALGIA   . GERD   . HYPERLIPIDEMIA   . HYPERTENSION   . INSOMNIA   . LOW BACK PAIN   . Morbid obesity     Anticipated LAP-BAND surgery  . NEPHROLITHIASIS, HX OF   . OBSESSIVE-COMPULSIVE DISORDER   . ORCHITIS   . PERIPHERAL EDEMA   . PLANTAR FASCIITIS, BILATERAL   . POLYARTHRALGIA   . Rash and other nonspecific skin eruption 04/12/2009  . SLEEP APNEA, OBSTRUCTIVE   . Atrial fibrillation     Initially identified 2001, recurring 2013  . Cardiomyopathy     Myoview with prior infarct- 2012; EF 25% TEE July 2013  . Syncope     Associated with atrial  fibrillation 2001  . H/O hiatal hernia   . Myocardial infarction     hx of MI   BP 110/53  Pulse 54  Resp 14  Ht 5\' 10"  (1.778 m)  Wt 275 lb (124.739 kg)  BMI 39.46 kg/m2  SpO2 95%     Review of Systems  Respiratory: Positive for apnea and shortness of breath.   Cardiovascular: Positive for leg swelling.  Gastrointestinal: Positive for constipation.  Genitourinary: Positive for difficulty urinating.  Musculoskeletal: Positive for back pain and gait problem.  All other systems reviewed and are negative.       Objective:   Physical Exam General: Alert and oriented x 3, No apparent distress. obese  HEENT: Head is normocephalic, atraumatic, PERRLA, EOMI, sclera anicteric, oral mucosa pink and moist, dentition intact, ext ear canals clear,  Neck: Supple without JVD or lymphadenopathy  Heart: Reg rate and rhythm. No murmurs rubs or gallops  Chest: CTA bilaterally without wheezes, rales, or rhonchi; no distress  Abdomen: Soft, non-tender, non-distended, bowel sounds positive.  Extremities: No clubbing, cyanosis, or edema. Pulses are 2+  Skin: Clean and intact without signs of breakdown. A few scars noted over mid back. Multiple bruises and ecchymoses over the arms.  Neuro: Pt is cognitively appropriate with normal insight, memory, and awareness. Cranial nerves 2-12 are intact. Sensory exam is normal. Reflexes are 2+ in all 4's. Fine motor coordination is intact. No tremors. Motor function is grossly 5/5.  Musculoskeletal: elevation or right hemipelvis. Levoscoliosis from mid lumbar to mid thoracic spine. Taut and tender musculature over lower 2/3 of right thoracic paraspsinals. A few TP's in right periscapular and left periscapular. Pain worse with extension and lateral bending to the right. discomfort also with flexion and rotation. Left knee tender with meniscal maneuvers, lateral more than medial. Mild crepitus left knee. No significant edema no instability. Some antalgia with  weight bearing on the left. Bilateral shoulders mildly tender with ER and IR. No gross joint deformities seen. Grip strength slightly reduced bilaterally. He displays a head forward posture with kyphosis noted. dextroscoilosis of thoracic and lumbar spine seen with adjoining levoscoliosis between these areas. Psych: Pt's affect is appropriate but a bit flat. Pt is cooperative  Assessment & Plan:   1. Chronic thoracic and lumbar spine pain. He has DDD of the thoracic spine on plain film which we reviewed today. The most affected levels appear to be T3-6. 2. Myofascial pain  3. Lumbar-thoracic scoliosis 4. ?RA- dxed by rheumatologist.  5. Obesity  6. OA of the bilateral knees, left more than right.  Plan:  1. Will order an MRI of the thoracic spine to followup his DDD and to guide any potential injections 2. Continue with soma for now.   3. Reviewed posture, pilates packet was given.  4. Increase hydrocodone to 7.5  5. Follow up here pending MRI findings. 30 minutes of face to face patient care time was spent during this visit. All questions were encouraged and answered.

## 2013-05-08 NOTE — Patient Instructions (Signed)
CONTINUE TO WORK ON YOUR POSTURE

## 2013-05-09 ENCOUNTER — Other Ambulatory Visit: Payer: Self-pay | Admitting: Physical Medicine & Rehabilitation

## 2013-05-11 ENCOUNTER — Telehealth: Payer: Self-pay

## 2013-05-11 NOTE — Telephone Encounter (Signed)
Patient request hydrocodone refill.  Looks like this was printed 05/08/13.  Patient says he never received the script.  Can we refill this?

## 2013-05-11 NOTE — Telephone Encounter (Signed)
Left message for patient to call office regarding electronic hydrocodone refill request.  Per note this was printed.

## 2013-05-11 NOTE — Telephone Encounter (Signed)
Patient called and said he found his prescription.

## 2013-05-20 ENCOUNTER — Encounter: Payer: Self-pay | Admitting: Gastroenterology

## 2013-05-22 ENCOUNTER — Ambulatory Visit
Admission: RE | Admit: 2013-05-22 | Discharge: 2013-05-22 | Disposition: A | Discharge status: Home / Self Care | Payer: Medicare HMO | Source: Ambulatory Visit | Attending: Physical Medicine & Rehabilitation | Admitting: Physical Medicine & Rehabilitation

## 2013-05-22 ENCOUNTER — Ambulatory Visit (INDEPENDENT_AMBULATORY_CARE_PROVIDER_SITE_OTHER): Payer: Medicare HMO | Admitting: General Practice

## 2013-05-22 DIAGNOSIS — M47814 Spondylosis without myelopathy or radiculopathy, thoracic region: Secondary | ICD-10-CM

## 2013-05-22 DIAGNOSIS — Z7901 Long term (current) use of anticoagulants: Secondary | ICD-10-CM

## 2013-05-22 DIAGNOSIS — I4891 Unspecified atrial fibrillation: Secondary | ICD-10-CM

## 2013-05-22 LAB — POCT INR: INR: 3.3

## 2013-05-24 ENCOUNTER — Other Ambulatory Visit: Payer: Self-pay | Admitting: Internal Medicine

## 2013-05-25 ENCOUNTER — Ambulatory Visit (INDEPENDENT_AMBULATORY_CARE_PROVIDER_SITE_OTHER): Payer: Medicare HMO | Admitting: Internal Medicine

## 2013-05-25 ENCOUNTER — Encounter: Payer: Self-pay | Admitting: Internal Medicine

## 2013-05-25 VITALS — BP 154/85 | HR 49 | Ht 70.0 in | Wt 280.0 lb

## 2013-05-25 DIAGNOSIS — I452 Bifascicular block: Secondary | ICD-10-CM

## 2013-05-25 DIAGNOSIS — I428 Other cardiomyopathies: Secondary | ICD-10-CM

## 2013-05-25 DIAGNOSIS — I10 Essential (primary) hypertension: Secondary | ICD-10-CM

## 2013-05-25 DIAGNOSIS — I4891 Unspecified atrial fibrillation: Secondary | ICD-10-CM

## 2013-05-25 DIAGNOSIS — I5022 Chronic systolic (congestive) heart failure: Secondary | ICD-10-CM

## 2013-05-25 MED ORDER — AMIODARONE HCL 200 MG PO TABS: 200.0000 mg | ORAL_TABLET | Freq: Every day | ORAL | Status: DC

## 2013-05-25 MED ORDER — LISINOPRIL 5 MG PO TABS: 5.0000 mg | ORAL_TABLET | Freq: Every day | ORAL | Status: DC

## 2013-05-25 NOTE — Patient Instructions (Addendum)
Your physician recommends that you schedule a follow-up appointment in: 8 WEEKS WITH DR. Graciela Husbands  Your physician recommends that you return for lab work TODAY  INCREASE LISINOPRIL TO 5MG  ONCE DAILY  DECREASE AMIODARONE TO 200MG  TABLET ONCE DAILY  Your physician has requested that you have an echocardiogram. Echocardiography is a painless test that uses sound waves to create images of your heart. It provides your doctor with information about the size and shape of your heart and how well your heart's chambers and valves are working. This procedure takes approximately one hour. There are no restrictions for this procedure.

## 2013-05-25 NOTE — Assessment & Plan Note (Signed)
As above.

## 2013-05-25 NOTE — Assessment & Plan Note (Signed)
Needs more aggressive management. We'll increase his lisinopril.

## 2013-05-25 NOTE — Progress Notes (Signed)
Patient Care Team: [name], MD as PCP - General [name], RD as Dietitian   HPI  [name] is a 57 y.o. male Seen in followup for atrial fibrillation for which she was treated with amiodarone; scheduled cardioversion did not occur as the patient had reverted to sinus rhythm spontaneously. He has not been seen since. Liver function tests were abnormal 12/13. He had not been rechecked  He had problems recently with peripheral edema. He is going to report. This has had an impact on his diet and he is eating largely out of cans.he has chronic stable dyspnea on exertion. He has no cough. There is no significant nausea. He is compliant with his CPAP. His card was read about 3 months ago   Echocardiogram 7/13 EF 20-25%. There is severe biatrial enlargement.  Myoview scheduled a year ago was not consummated. I don't see a record of a cardiac catheterization.    Past Medical History  Diagnosis Date  . ALLERGIC RHINITIS   . ANXIETY   . ASTHMA   . Chronic pain syndrome   . BENIGN PROSTATIC HYPERTROPHY   . DEPRESSION   . DIABETES MELLITUS, TYPE II   . DYSKINESIA, ESOPHAGUS   . FIBROMYALGIA   . GERD   . HYPERLIPIDEMIA   . HYPERTENSION   . INSOMNIA   . LOW BACK PAIN   . Morbid obesity     Anticipated LAP-BAND surgery  . NEPHROLITHIASIS, HX OF   . OBSESSIVE-COMPULSIVE DISORDER   . ORCHITIS   . PERIPHERAL EDEMA   . PLANTAR FASCIITIS, BILATERAL   . POLYARTHRALGIA   . Rash and other nonspecific skin eruption 04/12/2009  . SLEEP APNEA, OBSTRUCTIVE   . Atrial fibrillation     Initially identified 2001, recurring 2013  . Cardiomyopathy     Myoview with prior infarct- 2012; EF 25% TEE July 2013  . Syncope     Associated with atrial fibrillation 2001  . H/O hiatal hernia   . Myocardial infarction     hx of MI    Past Surgical History  Procedure Laterality Date  . Tonsillectomy    . Acne cyst removal      2 on back   . Cardioversion  04/18/2012   Procedure: CARDIOVERSION;  Surgeon: Pricilla Riffle, MD;  Location: [facility] OR;  Service: Cardiovascular;  Laterality: N/A;  . Tee without cardioversion  04/25/2012    Procedure: TRANSESOPHAGEAL ECHOCARDIOGRAM (TEE);  Surgeon: Vesta Mixer, MD;  Location: [facility] ENDOSCOPY;  Service: Cardiovascular;  Laterality: N/A;  . Cardioversion  04/25/2012    Procedure: CARDIOVERSION;  Surgeon: Vesta Mixer, MD;  Location: [facility] ENDOSCOPY;  Service: Cardiovascular;  Laterality: N/A;  . Cardioversion  04/25/2012    Procedure: CARDIOVERSION;  Surgeon: Pricilla Riffle, MD;  Location: [facility] OR;  Service: Cardiovascular;  Laterality: N/A;  . Cardioversion  05/09/2012    Procedure: CARDIOVERSION;  Surgeon: Tonny Bollman, MD;  Location: [facility] OR;  Service: Cardiovascular;  Laterality: N/A;  changed from crenshaw to cooper by trish/leone-endo    Current Outpatient Prescriptions  Medication Sig Dispense Refill  . ALPRAZolam (XANAX) 0.5 MG tablet TAKE 1 TABLET BY MOUTH TWICE A DAY AS NEEDED FOR ANXIETY  60 tablet  2  . amiodarone (PACERONE) 200 MG tablet Take two tablets by mouth three times a day  540 tablet  3  . carisoprodol (SOMA) 350 MG tablet TAKE 1 TABLET BY MOUTH 3 TIMES A DAY AS NEEDED  90 tablet  2  .  carvedilol (COREG) 6.25 MG tablet Take 1 tablet (6.25 mg total) by mouth 2 (two) times daily with a meal.  180 tablet  3  . clobetasol cream (TEMOVATE) 0.05 % Apply 1 application topically daily.       . digoxin (LANOXIN) 0.125 MG tablet Take 1 tablet (0.125 mg total) by mouth daily. taking1/2daily  45 tablet  3  . doxepin (SINEQUAN) 25 MG capsule Take 1 capsule (25 mg total) by mouth 2 (two) times daily.  180 capsule  3  . Fluticasone-Salmeterol (ADVAIR) 250-50 MCG/DOSE AEPB Inhale 1 puff into the lungs daily.  14 each  0  . furosemide (LASIX) 40 MG tablet Take 1 tablet (40 mg total) by mouth 2 (two) times daily.  180 tablet  PRN  . HYDROcodone-acetaminophen (NORCO) 7.5-325 MG per tablet Take 1 tablet by mouth every 6 (six)  hours as needed for pain.  90 tablet  0  . hydrocortisone 2.5 % cream Apply 1 application topically 2 (two) times daily. As needed to facial areas only  30 g  3  . ibuprofen (ADVIL,MOTRIN) 800 MG tablet Take 1 tablet (800 mg total) by mouth every 6 (six) hours as needed.  180 tablet  1  . lisinopril (PRINIVIL,ZESTRIL) 2.5 MG tablet Take 1 tablet (2.5 mg total) by mouth daily.  90 tablet  3  . omeprazole (PRILOSEC) 20 MG capsule TAKE 2 CAPSULES BY MOUTH EVERY DAY  180 capsule  2  . PROAIR HFA 108 (90 BASE) MCG/ACT inhaler INHALE 2 PUFFS INTO THE LUNGS 4 TIMES DAILY.  8.5 g  11  . sertraline (ZOLOFT) 100 MG tablet Take 2 tablets (200 mg total) by mouth daily.  180 tablet  2  . traZODone (DESYREL) 100 MG tablet TAKE 1/2 TABLET BY MOUTH AT BEDTIME AS NEEDED  45 tablet  1  . warfarin (COUMADIN) 5 MG tablet TAKE AS DIRECTED BY ANTICOAGULATION CLINIC  90 tablet  1  . zolpidem (AMBIEN) 10 MG tablet Take 1 tablet (10 mg total) by mouth at bedtime as needed. For sleep  30 tablet  5  . metFORMIN (GLUCOPHAGE) 500 MG tablet Take 500 mg by mouth 2 (two) times daily with a meal.      . [DISCONTINUED] atorvastatin (LIPITOR) 20 MG tablet Take 1 tablet (20 mg total) by mouth daily.  30 tablet  11  . [DISCONTINUED] gabapentin (NEURONTIN) 300 MG capsule Take 1 capsule (300 mg total) by mouth 3 (three) times daily.  90 capsule  5   No current facility-administered medications for this visit.    Allergies  Allergen Reactions  . Simvastatin     REACTION: severe legs  . Statins Other (See Comments)    myalgia    Review of Systems negative except from HPI and PMH  Physical Exam BP 154/85  Pulse 49  Ht 5\' 10"  (1.778 m)  Wt 280 lb (127.007 kg)  BMI 40.18 kg/m2 Well developed and well nourished in no acute distress HENT normal E scleral and icterus clear Neck Supple [id number] ; carotids brisk and full Clear to ausculation  Regular rate and rhythm, no murmurs gallops or rub Soft with active bowel sounds RUQ  tenderness No clubbing cyanosis 2+ Edema Alert and oriented, grossly normal motor and sensory function Skin Warm and Dry  ECG demonstrates sinus rhythm at 49 intervals 22/18/54 Axis II 87  Assessment and  Plan

## 2013-05-25 NOTE — Assessment & Plan Note (Signed)
The patient has been on amiodarone at 1200 mg a day for many months now. We will decrease it to 200 mg a day. We will check amiodarone surveillance laboratories. It evidence of LFT elevation in December; his right upper quadrant tenderness.Maryclare Labrador also check digoxin level; he is currently on warfarin which is being followed by primary care.

## 2013-05-25 NOTE — Assessment & Plan Note (Addendum)
We will reassess left ventricular function. His blood pressure is elevated. I am inclined to increase his lisinopril today from 2.5--5 mg daily. carvedilol up titration of his precluded by bradycardia.  He may benefit from CRT with his very broad right bundle and congestive heart failure his left ventricular function remains poor. We will reassess left ventricular function  In addition, we will increase his Lasix from 40/40--80/40. He is also advised to try to work on his diet which right now is consisting of eatiang out of cans as he goes through his divorce.[name]

## 2013-05-26 LAB — HEPATIC FUNCTION PANEL
AST: 39 U/L — ABNORMAL HIGH (ref 0–37)
Alkaline Phosphatase: 46 U/L (ref 39–117)
Bilirubin, Direct: 0.1 mg/dL (ref 0.0–0.3)
Total Protein: 6.2 g/dL (ref 6.0–8.3)

## 2013-05-26 LAB — TSH: TSH: 2.3 u[IU]/mL (ref 0.35–5.50)

## 2013-05-27 ENCOUNTER — Other Ambulatory Visit: Payer: Self-pay | Admitting: Internal Medicine

## 2013-05-27 ENCOUNTER — Other Ambulatory Visit: Payer: Self-pay

## 2013-05-27 MED ORDER — HYDROCORTISONE 2.5 % EX CREA: 1.0000 "application " | TOPICAL_CREAM | Freq: Two times a day (BID) | CUTANEOUS | Status: DC

## 2013-05-28 ENCOUNTER — Ambulatory Visit (HOSPITAL_COMMUNITY): Payer: Medicare HMO | Attending: Internal Medicine

## 2013-05-28 DIAGNOSIS — I509 Heart failure, unspecified: Secondary | ICD-10-CM

## 2013-05-28 DIAGNOSIS — E119 Type 2 diabetes mellitus without complications: Secondary | ICD-10-CM | POA: Insufficient documentation

## 2013-05-28 DIAGNOSIS — I10 Essential (primary) hypertension: Secondary | ICD-10-CM

## 2013-05-28 DIAGNOSIS — I428 Other cardiomyopathies: Secondary | ICD-10-CM

## 2013-05-28 DIAGNOSIS — G4730 Sleep apnea, unspecified: Secondary | ICD-10-CM | POA: Insufficient documentation

## 2013-05-28 DIAGNOSIS — I447 Left bundle-branch block, unspecified: Secondary | ICD-10-CM | POA: Insufficient documentation

## 2013-05-28 DIAGNOSIS — E785 Hyperlipidemia, unspecified: Secondary | ICD-10-CM | POA: Insufficient documentation

## 2013-05-28 DIAGNOSIS — I359 Nonrheumatic aortic valve disorder, unspecified: Secondary | ICD-10-CM | POA: Insufficient documentation

## 2013-05-28 DIAGNOSIS — I4891 Unspecified atrial fibrillation: Secondary | ICD-10-CM

## 2013-05-28 DIAGNOSIS — IMO0001 Reserved for inherently not codable concepts without codable children: Secondary | ICD-10-CM | POA: Insufficient documentation

## 2013-05-28 DIAGNOSIS — I252 Old myocardial infarction: Secondary | ICD-10-CM | POA: Insufficient documentation

## 2013-05-28 DIAGNOSIS — I452 Bifascicular block: Secondary | ICD-10-CM

## 2013-05-28 DIAGNOSIS — I5022 Chronic systolic (congestive) heart failure: Secondary | ICD-10-CM

## 2013-05-28 NOTE — Telephone Encounter (Signed)
Done erx 

## 2013-05-28 NOTE — Progress Notes (Signed)
Echocardiogram performed.  

## 2013-06-04 ENCOUNTER — Telehealth: Payer: Self-pay | Admitting: *Deleted

## 2013-06-04 NOTE — Telephone Encounter (Signed)
I spoke with the patient. He has been his medications from [facility]. I will call them to follow up on his meds for safety portal. The patient also c/o continued lower extremity edema, chest pain, and SOB that is unchanged but no better since decreasing amiodarone. Will forward to Dr. Graciela Husbands for review and recommendations. We will call the patient back after. He is agreeable.

## 2013-06-04 NOTE — Telephone Encounter (Signed)
I left a message for the patient to call. I have asked that he tell us which pharmacy was filling his amiodarone over the last year. I have called [facility] on [address] and the last time they refilled this was 05/2012. Need information to see who was prescribing for the patient and to report in safety zone portal. When the patient came to see Dr. Graciela Husbands on 8/11 he was taking amiodarone 200 mg two tablets by mouth three times a day.

## 2013-06-04 NOTE — Telephone Encounter (Signed)
Follow up  ° ° ° ° °Pt is returning your call  °

## 2013-06-08 ENCOUNTER — Other Ambulatory Visit: Payer: Self-pay | Admitting: Internal Medicine

## 2013-06-11 ENCOUNTER — Other Ambulatory Visit: Payer: Self-pay

## 2013-06-11 DIAGNOSIS — M47817 Spondylosis without myelopathy or radiculopathy, lumbosacral region: Secondary | ICD-10-CM

## 2013-06-11 DIAGNOSIS — M1712 Unilateral primary osteoarthritis, left knee: Secondary | ICD-10-CM

## 2013-06-11 DIAGNOSIS — M47814 Spondylosis without myelopathy or radiculopathy, thoracic region: Secondary | ICD-10-CM

## 2013-06-11 MED ORDER — HYDROCODONE-ACETAMINOPHEN 7.5-325 MG PO TABS: 1.0000 | ORAL_TABLET | Freq: Four times a day (QID) | ORAL | Status: DC | PRN

## 2013-06-14 ENCOUNTER — Other Ambulatory Visit: Payer: Self-pay | Admitting: Internal Medicine

## 2013-06-16 NOTE — Telephone Encounter (Signed)
Done hardcopy to robin  

## 2013-06-16 NOTE — Telephone Encounter (Signed)
Faxed hardcopy to [facility]. [place]

## 2013-06-17 ENCOUNTER — Ambulatory Visit (INDEPENDENT_AMBULATORY_CARE_PROVIDER_SITE_OTHER): Payer: Medicare HMO | Admitting: General Practice

## 2013-06-17 ENCOUNTER — Other Ambulatory Visit: Payer: Self-pay | Admitting: Internal Medicine

## 2013-06-17 ENCOUNTER — Encounter: Payer: Medicare HMO | Attending: Physical Medicine & Rehabilitation | Admitting: Physical Medicine & Rehabilitation

## 2013-06-17 ENCOUNTER — Encounter: Payer: Self-pay | Admitting: Physical Medicine & Rehabilitation

## 2013-06-17 VITALS — BP 146/77 | HR 57 | Resp 14 | Ht 70.0 in | Wt 290.0 lb

## 2013-06-17 DIAGNOSIS — M5134 Other intervertebral disc degeneration, thoracic region: Secondary | ICD-10-CM | POA: Insufficient documentation

## 2013-06-17 DIAGNOSIS — M47817 Spondylosis without myelopathy or radiculopathy, lumbosacral region: Secondary | ICD-10-CM | POA: Insufficient documentation

## 2013-06-17 DIAGNOSIS — Z5181 Encounter for therapeutic drug level monitoring: Secondary | ICD-10-CM | POA: Insufficient documentation

## 2013-06-17 DIAGNOSIS — M1710 Unilateral primary osteoarthritis, unspecified knee: Secondary | ICD-10-CM

## 2013-06-17 DIAGNOSIS — M47814 Spondylosis without myelopathy or radiculopathy, thoracic region: Secondary | ICD-10-CM | POA: Insufficient documentation

## 2013-06-17 DIAGNOSIS — M1712 Unilateral primary osteoarthritis, left knee: Secondary | ICD-10-CM

## 2013-06-17 DIAGNOSIS — Z7901 Long term (current) use of anticoagulants: Secondary | ICD-10-CM

## 2013-06-17 DIAGNOSIS — Z79899 Other long term (current) drug therapy: Secondary | ICD-10-CM

## 2013-06-17 DIAGNOSIS — IMO0001 Reserved for inherently not codable concepts without codable children: Secondary | ICD-10-CM | POA: Insufficient documentation

## 2013-06-17 DIAGNOSIS — I4891 Unspecified atrial fibrillation: Secondary | ICD-10-CM

## 2013-06-17 DIAGNOSIS — IMO0002 Reserved for concepts with insufficient information to code with codable children: Secondary | ICD-10-CM | POA: Insufficient documentation

## 2013-06-17 LAB — POCT INR: INR: 2.4

## 2013-06-17 MED ORDER — PREDNISONE 20 MG PO TABS: 20.0000 mg | ORAL_TABLET | Freq: Every day | ORAL | Status: DC

## 2013-06-17 MED ORDER — HYDROCODONE-ACETAMINOPHEN 7.5-325 MG PO TABS: 1.0000 | ORAL_TABLET | Freq: Four times a day (QID) | ORAL | Status: DC | PRN

## 2013-06-17 NOTE — Progress Notes (Signed)
Subjective:    Patient ID: [name], male    DOB: 06-12-1956, 57 y.o.   MRN: [id number]  HPI  [name] is back regarding his chronic back pain. At his last visit with me I had ordered an MRI of his thoracic spine. 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 These have minimally progressed since 2005.  There is a small central disc herniation at T12-L1. No significant  canal or foraminal stenosis.  Normal cord signal and morphology. Incidental root sleeve cysts on  the right at T10-11 and T11-12.   His spinal pain is higher than the aforementioned areas.   Pain Inventory Average Pain 8 Pain Right Now 6 My pain is sharp and stabbing  In the last 24 hours, has pain interfered with the following? General activity 4 Relation with others 5 Enjoyment of life 3 What TIME of day is your pain at its worst? evening Sleep (in general) Fair  Pain is worse with: walking, bending and sitting Pain improves with: rest, heat/ice and medication Relief from Meds: 4  Mobility walk without assistance use a cane how many minutes can you walk? 30 ability to climb steps?  no do you drive?  yes Do you have any goals in this area?  yes  Function disabled: date disabled 2014  Neuro/Psych bladder control problems tingling trouble walking  Prior Studies Any changes since last visit?  no  Physicians involved in your care Any changes since last visit?  no   Family History  Problem Relation Age of Onset  . COPD Mother   . Asthma Mother   . Colon polyps Mother   . Allergies Mother   . Asthma Maternal Grandmother    History   Social History  . Marital Status: Married    Spouse Name: N/A    Number of Children: 2  . Years of Education: N/A   Occupational History  . retired/disabled. prev worked in Teacher, English as a foreign language.    Social History Main Topics  .  Smoking status: Former Smoker -- 2.00 packs/day for 30 years    Types: Cigarettes    Quit date: 10/16/2007  . Smokeless tobacco: Never Used  . Alcohol Use: No  . Drug Use: No  . Sexual Activity: None   Other Topics Concern  . None   Social History Narrative   Lives alone.   Past Surgical History  Procedure Laterality Date  . Tonsillectomy    . Acne cyst removal      2 on back   . Cardioversion  04/18/2012    Procedure: CARDIOVERSION;  Surgeon: Pricilla Riffle, MD;  Location: [facility] OR;  Service: Cardiovascular;  Laterality: N/A;  . Tee without cardioversion  04/25/2012    Procedure: TRANSESOPHAGEAL ECHOCARDIOGRAM (TEE);  Surgeon: Vesta Mixer, MD;  Location: [facility] ENDOSCOPY;  Service: Cardiovascular;  Laterality: N/A;  . Cardioversion  04/25/2012    Procedure: CARDIOVERSION;  Surgeon: Vesta Mixer, MD;  Location: [facility] ENDOSCOPY;  Service: Cardiovascular;  Laterality: N/A;  . Cardioversion  04/25/2012    Procedure: CARDIOVERSION;  Surgeon: Pricilla Riffle, MD;  Location: [facility] OR;  Service: Cardiovascular;  Laterality: N/A;  . Cardioversion  05/09/2012    Procedure: CARDIOVERSION;  Surgeon: Tonny Bollman, MD;  Location: [facility] OR;  Service: Cardiovascular;  Laterality: N/A;  changed from crenshaw to cooper by trish/leone-endo   Past Medical History  Diagnosis Date  .  ALLERGIC RHINITIS   . ANXIETY   . ASTHMA   . Chronic pain syndrome   . BENIGN PROSTATIC HYPERTROPHY   . DEPRESSION   . DIABETES MELLITUS, TYPE II   . DYSKINESIA, ESOPHAGUS   . FIBROMYALGIA   . GERD   . HYPERLIPIDEMIA   . HYPERTENSION   . INSOMNIA   . LOW BACK PAIN   . Morbid obesity     Anticipated LAP-BAND surgery  . NEPHROLITHIASIS, HX OF   . OBSESSIVE-COMPULSIVE DISORDER   . ORCHITIS   . PERIPHERAL EDEMA   . PLANTAR FASCIITIS, BILATERAL   . POLYARTHRALGIA   . Rash and other nonspecific skin eruption 04/12/2009  . SLEEP APNEA, OBSTRUCTIVE   . Atrial fibrillation     Initially identified 2001, recurring 2013  .  Cardiomyopathy     Myoview with prior infarct- 2012; EF 25% TEE July 2013  . Syncope     Associated with atrial fibrillation 2001  . H/O hiatal hernia   . Myocardial infarction     hx of MI   BP 146/77  Pulse 57  Resp 14  Ht 5\' 10"  (1.778 m)  Wt 290 lb (131.543 kg)  BMI 41.61 kg/m2  SpO2 91%     Review of Systems  Respiratory: Positive for apnea and shortness of breath.   Cardiovascular: Positive for leg swelling.  Gastrointestinal: Positive for constipation.  Genitourinary: Positive for difficulty urinating.  Musculoskeletal: Positive for back pain and gait problem.  All other systems reviewed and are negative.       Objective:   Physical Exam General: Alert and oriented x 3, No apparent distress. obese  HEENT: Head is normocephalic, atraumatic, PERRLA, EOMI, sclera anicteric, oral mucosa pink and moist, dentition intact, ext ear canals clear,  Neck: Supple without JVD or lymphadenopathy  Heart: Reg rate and rhythm. No murmurs rubs or gallops  Chest: CTA bilaterally without wheezes, rales, or rhonchi; no distress  Abdomen: Soft, non-tender, non-distended, bowel sounds positive.  Extremities: No clubbing, cyanosis, or edema. Pulses are 2+  Skin: Clean and intact without signs of breakdown. A few scars noted over mid back. Multiple bruises and ecchymoses over the arms.  Neuro: Pt is cognitively appropriate with normal insight, memory, and awareness. Cranial nerves 2-12 are intact. Sensory exam is normal. Reflexes are 2+ in all 4's. Fine motor coordination is intact. No tremors. Motor function is grossly 5/5.  Musculoskeletal: elevation or right hemipelvis. Levoscoliosis from mid lumbar to mid thoracic spine. Taut and tender musculature over lower 2/3 of right thoracic paraspsinals. Notable muscle spasm and triggerpoints in the right lower intrascapular paraspinals.   Left knee tender with meniscal maneuvers, lateral more than medial. Mild crepitus left knee. No significant  edema no instability. Some antalgia with weight bearing on the left. Bilateral shoulders mildly tender with ER and IR. No gross joint deformities seen. Grip strength slightly reduced bilaterally. He continues to display a head forward posture with kyphosis noted. dextroscoilosis of thoracic and lumbar spine seen with adjoining levoscoliosis between these areas.  Psych: Pt's affect is appropriate but a bit flat. Pt is cooperative  ds Assessment & Plan:   1. Chronic thoracic and lumbar spine pain. He has DDD of the thoracic spine on plain film which we reviewed today. The most affected levels appear to be T3-6. His MRI shows no acute disease, and the most affected levels are below the area where is pain is presenting. 2. Myofascial pain in thoracic paraspinals, maladaptive posture syndrome 3. Lumbar-thoracic  scoliosis  4. ?RA- dxed by rheumatologist.  5. Obesity  6. OA of the bilateral knees, left more than right.    Plan:  1. After informed consent and preparation of the skin with isopropyl alcohol, I injected the right periscapular paraspinals with 2cc of 1% lidocaine. The patient tolerated well, and no complications were experienced. Post-injection instructions were provided.  2. Prednisone taper to help with acute pain as well. #26 3. Reviewed posture once again. This very important for him  4. Continue hydrocodone to 7.5 for breakthrough pain. He may come back for refills next month 5. Follow up in 2 months. 30 minutes of face to face patient care time was spent during this visit. All questions were encouraged and answered.

## 2013-06-17 NOTE — Patient Instructions (Signed)
WORK ON YOUR POSTURE.

## 2013-06-18 NOTE — Telephone Encounter (Signed)
I called and spoke with the pharmacist at [facility]. She states the initial RX for amiodarone was for 200 mg two tablets by mouth three times a day on 06/10/12. Filled again on 11/03/12 and not again until 05/20/13. Per the pharmacist, the patients medication was refilled under Dr. Graciela Husbands. Will forward to Dr. Graciela Husbands as a follow up and submit [place]. Sherri Rad, RN, BSN  Dr. Graciela Husbands, please read previous encounter from 8/21 regarding patient symptoms. Will also forward to Dr. Odessa Fleming nurse, Roanna Raider, as an [place]. Sherri Rad, RN, BSN

## 2013-06-26 ENCOUNTER — Other Ambulatory Visit: Payer: Self-pay

## 2013-06-26 MED ORDER — ZOLPIDEM TARTRATE 10 MG PO TABS: 10.0000 mg | ORAL_TABLET | Freq: Every evening | ORAL | Status: DC | PRN

## 2013-06-26 NOTE — Telephone Encounter (Signed)
Faxed hardcopy to [facility]  

## 2013-06-26 NOTE — Telephone Encounter (Signed)
Done hardcopy to robin  

## 2013-06-29 ENCOUNTER — Other Ambulatory Visit: Payer: Self-pay | Admitting: Physical Medicine & Rehabilitation

## 2013-06-29 ENCOUNTER — Other Ambulatory Visit: Payer: Self-pay | Admitting: Internal Medicine

## 2013-06-30 ENCOUNTER — Ambulatory Visit (INDEPENDENT_AMBULATORY_CARE_PROVIDER_SITE_OTHER): Payer: Medicare HMO

## 2013-06-30 DIAGNOSIS — Z23 Encounter for immunization: Secondary | ICD-10-CM

## 2013-07-03 ENCOUNTER — Other Ambulatory Visit: Payer: Self-pay | Admitting: Internal Medicine

## 2013-07-07 ENCOUNTER — Other Ambulatory Visit: Payer: Self-pay | Admitting: Internal Medicine

## 2013-07-10 ENCOUNTER — Other Ambulatory Visit: Payer: Self-pay | Admitting: Internal Medicine

## 2013-07-25 ENCOUNTER — Other Ambulatory Visit: Payer: Self-pay | Admitting: Internal Medicine

## 2013-07-27 ENCOUNTER — Ambulatory Visit: Payer: Medicare HMO | Admitting: Internal Medicine

## 2013-07-27 ENCOUNTER — Other Ambulatory Visit: Payer: Self-pay | Admitting: Internal Medicine

## 2013-07-30 ENCOUNTER — Other Ambulatory Visit: Payer: Self-pay | Admitting: Internal Medicine

## 2013-08-04 ENCOUNTER — Other Ambulatory Visit: Payer: Self-pay | Admitting: Internal Medicine

## 2013-08-04 NOTE — Telephone Encounter (Signed)
Done hardcopy to robin  

## 2013-08-05 ENCOUNTER — Telehealth: Payer: Self-pay

## 2013-08-05 NOTE — Telephone Encounter (Signed)
Faxed hardcopy to [facility] [address] [place] 

## 2013-08-05 NOTE — Telephone Encounter (Signed)
Patient is requesting a refill on Hydrocodone. 

## 2013-08-06 NOTE — Telephone Encounter (Signed)
Left message for patient to schedule appointment to get hydrocodone refill due to new office policy.

## 2013-08-17 ENCOUNTER — Ambulatory Visit: Payer: Medicare HMO

## 2013-08-17 ENCOUNTER — Encounter: Payer: Medicare HMO | Admitting: Physical Medicine & Rehabilitation

## 2013-08-18 ENCOUNTER — Encounter: Payer: Self-pay | Admitting: Internal Medicine

## 2013-08-18 ENCOUNTER — Ambulatory Visit (INDEPENDENT_AMBULATORY_CARE_PROVIDER_SITE_OTHER): Payer: Medicare HMO | Admitting: General Practice

## 2013-08-18 ENCOUNTER — Ambulatory Visit (INDEPENDENT_AMBULATORY_CARE_PROVIDER_SITE_OTHER): Payer: Medicare HMO | Admitting: Internal Medicine

## 2013-08-18 ENCOUNTER — Encounter: Payer: Medicare HMO | Attending: Physical Medicine & Rehabilitation | Admitting: Physical Medicine & Rehabilitation

## 2013-08-18 ENCOUNTER — Encounter: Payer: Self-pay | Admitting: Physical Medicine & Rehabilitation

## 2013-08-18 ENCOUNTER — Other Ambulatory Visit (INDEPENDENT_AMBULATORY_CARE_PROVIDER_SITE_OTHER): Payer: Medicare HMO

## 2013-08-18 VITALS — BP 103/68 | HR 65 | Resp 16 | Ht 70.0 in | Wt 299.0 lb

## 2013-08-18 VITALS — BP 122/78 | HR 68 | Temp 97.9°F | Ht 70.0 in | Wt 299.5 lb

## 2013-08-18 DIAGNOSIS — R310 Gross hematuria: Secondary | ICD-10-CM

## 2013-08-18 DIAGNOSIS — I10 Essential (primary) hypertension: Secondary | ICD-10-CM

## 2013-08-18 DIAGNOSIS — M1710 Unilateral primary osteoarthritis, unspecified knee: Secondary | ICD-10-CM

## 2013-08-18 DIAGNOSIS — M47817 Spondylosis without myelopathy or radiculopathy, lumbosacral region: Secondary | ICD-10-CM

## 2013-08-18 DIAGNOSIS — E119 Type 2 diabetes mellitus without complications: Secondary | ICD-10-CM

## 2013-08-18 DIAGNOSIS — G894 Chronic pain syndrome: Secondary | ICD-10-CM

## 2013-08-18 DIAGNOSIS — M25511 Pain in right shoulder: Secondary | ICD-10-CM

## 2013-08-18 DIAGNOSIS — M25519 Pain in unspecified shoulder: Secondary | ICD-10-CM | POA: Insufficient documentation

## 2013-08-18 DIAGNOSIS — M1712 Unilateral primary osteoarthritis, left knee: Secondary | ICD-10-CM

## 2013-08-18 DIAGNOSIS — F329 Major depressive disorder, single episode, unspecified: Secondary | ICD-10-CM

## 2013-08-18 DIAGNOSIS — M47814 Spondylosis without myelopathy or radiculopathy, thoracic region: Secondary | ICD-10-CM

## 2013-08-18 DIAGNOSIS — I252 Old myocardial infarction: Secondary | ICD-10-CM

## 2013-08-18 DIAGNOSIS — IMO0002 Reserved for concepts with insufficient information to code with codable children: Secondary | ICD-10-CM | POA: Insufficient documentation

## 2013-08-18 DIAGNOSIS — M5134 Other intervertebral disc degeneration, thoracic region: Secondary | ICD-10-CM

## 2013-08-18 DIAGNOSIS — Z7901 Long term (current) use of anticoagulants: Secondary | ICD-10-CM

## 2013-08-18 DIAGNOSIS — IMO0001 Reserved for inherently not codable concepts without codable children: Secondary | ICD-10-CM

## 2013-08-18 DIAGNOSIS — F3289 Other specified depressive episodes: Secondary | ICD-10-CM

## 2013-08-18 DIAGNOSIS — I4891 Unspecified atrial fibrillation: Secondary | ICD-10-CM

## 2013-08-18 LAB — CBC WITH DIFFERENTIAL/PLATELET
Basophils Absolute: 0 10*3/uL (ref 0.0–0.1)
Basophils Relative: 0.6 % (ref 0.0–3.0)
Eosinophils Absolute: 0.1 10*3/uL (ref 0.0–0.7)
Eosinophils Relative: 2.2 % (ref 0.0–5.0)
HCT: 39.9 % (ref 39.0–52.0)
Lymphocytes Relative: 23.6 % (ref 12.0–46.0)
Lymphs Abs: 1.4 10*3/uL (ref 0.7–4.0)
MCHC: 34.6 g/dL (ref 30.0–36.0)
MCV: 92.1 fl (ref 78.0–100.0)
Monocytes Absolute: 0.5 10*3/uL (ref 0.1–1.0)
Neutro Abs: 4 10*3/uL (ref 1.4–7.7)
Neutrophils Relative %: 65.1 % (ref 43.0–77.0)
RBC: 4.34 Mil/uL (ref 4.22–5.81)
RDW: 12.7 % (ref 11.5–14.6)
WBC: 6.1 10*3/uL (ref 4.5–10.5)

## 2013-08-18 LAB — HEPATIC FUNCTION PANEL
ALT: 29 U/L (ref 0–53)
Alkaline Phosphatase: 52 U/L (ref 39–117)
Bilirubin, Direct: 0.1 mg/dL (ref 0.0–0.3)
Total Bilirubin: 0.4 mg/dL (ref 0.3–1.2)
Total Protein: 6.8 g/dL (ref 6.0–8.3)

## 2013-08-18 LAB — BASIC METABOLIC PANEL
CO2: 27 mEq/L (ref 19–32)
Calcium: 8.9 mg/dL (ref 8.4–10.5)
Chloride: 101 mEq/L (ref 96–112)
Creatinine, Ser: 0.9 mg/dL (ref 0.4–1.5)
Potassium: 4.4 mEq/L (ref 3.5–5.1)
Sodium: 134 mEq/L — ABNORMAL LOW (ref 135–145)

## 2013-08-18 LAB — URINALYSIS, ROUTINE W REFLEX MICROSCOPIC
Bilirubin Urine: NEGATIVE
Ketones, ur: NEGATIVE
Nitrite: NEGATIVE
Total Protein, Urine: NEGATIVE
pH: 6 (ref 5.0–8.0)

## 2013-08-18 LAB — LIPID PANEL
Cholesterol: 236 mg/dL — ABNORMAL HIGH (ref 0–200)
Total CHOL/HDL Ratio: 6

## 2013-08-18 MED ORDER — HYDROCODONE-ACETAMINOPHEN 7.5-325 MG PO TABS: 1.0000 | ORAL_TABLET | Freq: Four times a day (QID) | ORAL | Status: DC | PRN

## 2013-08-18 MED ORDER — PREDNISONE 20 MG PO TABS: 20.0000 mg | ORAL_TABLET | Freq: Every day | ORAL | Status: DC

## 2013-08-18 NOTE — Assessment & Plan Note (Signed)
To fu later today with Dr Hermelinda Medicus

## 2013-08-18 NOTE — Progress Notes (Signed)
Pre-visit discussion using our clinic review tool. No additional management support is needed unless otherwise documented below in the visit note.  

## 2013-08-18 NOTE — Assessment & Plan Note (Signed)
stable overall by history and exam, recent data reviewed with pt, and pt to continue medical treatment as before,  to f/u any worsening symptoms or concerns BP Readings from Last 3 Encounters:  08/18/13 122/78  06/17/13 146/77  05/25/13 154/85

## 2013-08-18 NOTE — Assessment & Plan Note (Signed)
stable overall by history and exam, recent data reviewed with pt, and pt to continue medical treatment as before,  to f/u any worsening symptoms or concerns Lab Results  Component Value Date   HGBA1C 5.8 09/26/2012

## 2013-08-18 NOTE — Patient Instructions (Signed)
PLEASE WORK ON YOUR PHYSICAL ACTIVITY.    PLEASE CONTINUE TO WORK ON YOUR POSTURE.

## 2013-08-18 NOTE — Assessment & Plan Note (Signed)
decliens further tx at this time or cousneling

## 2013-08-18 NOTE — Patient Instructions (Signed)
You are given the handicapped form today for parking Please continue all other medications as before Please have the pharmacy call with any other refills you may need. Please keep your appointments with your specialists as you have planned - Dr Hermelinda Medicus  Please go to the LAB in the Basement (turn left off the elevator) for the tests to be done today (can be later today if you need to go to another appt now)  You will be contacted regarding the referral for: CT abd and pelvis (to see [facility] now, or later today if need to leave for your other appt)  You will be contacted regarding the referral for: urology  Please return in 6 months, or sooner if needed

## 2013-08-18 NOTE — Progress Notes (Signed)
Subjective:    Patient ID: [name], male    DOB: [birth year], 57 y.o.   MRN: [id number]  HPI  [name] is back regarding his chronic pain. He had about a month's worth of relief with the TPI's we did last month. The steroid taper was also beneficial.   He is struggling with the divorce of his wife and his eventual relocation. He is in the process of moving closer to [employer]. He plans on getting back into the [facility] and workouts once the move is complete. His daughter is here for support today.   He is using the hydrocodone for breakthrough pain and for the most part it remains effective. He ran out a few days before coming in today and has used ibuprofen to help bridge the gap.   Pain Inventory Average Pain 8 Pain Right Now 8 My pain is sharp, stabbing, tingling and aching  In the last 24 hours, has pain interfered with the following? General activity 2 Relation with others 4 Enjoyment of life 2 What TIME of day is your pain at its worst? evening Sleep (in general) Fair  Pain is worse with: walking and sitting Pain improves with: medication and injections Relief from Meds: 4  Mobility use a cane how many minutes can you walk? 10 ability to climb steps?  yes do you drive?  yes transfers alone Do you have any goals in this area?  no  Function disabled: date disabled 2008 I need assistance with the following:  household duties  Neuro/Psych weakness tremor tingling trouble walking dizziness depression anxiety suicidal thoughts-no plan/family aware/suffers from depression  Prior Studies Any changes since last visit?  no  Physicians involved in your care Any changes since last visit?  no   Family History  Problem Relation Age of Onset  . COPD Mother   . Asthma Mother   . Colon polyps Mother   . Allergies Mother   . Asthma Maternal Grandmother    History   Social History  . Marital Status: Married    Spouse Name: N/A    Number of Children: 2  .  Years of Education: N/A   Occupational History  . retired/disabled. prev worked in Teacher, English as a foreign language.    Social History Main Topics  . Smoking status: Former Smoker -- 2.00 packs/day for 30 years    Types: Cigarettes    Quit date: 10/16/2007  . Smokeless tobacco: Never Used  . Alcohol Use: No  . Drug Use: No  . Sexual Activity: None   Other Topics Concern  . None   Social History Narrative   Lives alone.   Past Surgical History  Procedure Laterality Date  . Tonsillectomy    . Acne cyst removal      2 on back   . Cardioversion  04/18/2012    Procedure: CARDIOVERSION;  Surgeon: Pricilla Riffle, MD;  Location: [facility] OR;  Service: Cardiovascular;  Laterality: N/A;  . Tee without cardioversion  04/25/2012    Procedure: TRANSESOPHAGEAL ECHOCARDIOGRAM (TEE);  Surgeon: Vesta Mixer, MD;  Location: [facility] ENDOSCOPY;  Service: Cardiovascular;  Laterality: N/A;  . Cardioversion  04/25/2012    Procedure: CARDIOVERSION;  Surgeon: Vesta Mixer, MD;  Location: [facility] ENDOSCOPY;  Service: Cardiovascular;  Laterality: N/A;  . Cardioversion  04/25/2012    Procedure: CARDIOVERSION;  Surgeon: Pricilla Riffle, MD;  Location: [facility] OR;  Service: Cardiovascular;  Laterality: N/A;  . Cardioversion  05/09/2012    Procedure: CARDIOVERSION;  Surgeon: Tonny Bollman,  MD;  Location: [facility] OR;  Service: Cardiovascular;  Laterality: N/A;  changed from crenshaw to cooper by trish/leone-endo   Past Medical History  Diagnosis Date  . ALLERGIC RHINITIS   . ANXIETY   . ASTHMA   . Chronic pain syndrome   . BENIGN PROSTATIC HYPERTROPHY   . DEPRESSION   . DIABETES MELLITUS, TYPE II   . DYSKINESIA, ESOPHAGUS   . FIBROMYALGIA   . GERD   . HYPERLIPIDEMIA   . HYPERTENSION   . INSOMNIA   . LOW BACK PAIN   . Morbid obesity     Anticipated LAP-BAND surgery  . NEPHROLITHIASIS, HX OF   . OBSESSIVE-COMPULSIVE DISORDER   . ORCHITIS   . PERIPHERAL EDEMA   . PLANTAR FASCIITIS, BILATERAL   . POLYARTHRALGIA   . Rash and other  nonspecific skin eruption 04/12/2009  . SLEEP APNEA, OBSTRUCTIVE   . Atrial fibrillation     Initially identified 2001, recurring 2013  . Cardiomyopathy     Myoview with prior infarct- 2012; EF 25% TEE July 2013  . Syncope     Associated with atrial fibrillation 2001  . H/O hiatal hernia   . Myocardial infarction     hx of MI   BP 103/68  Pulse 65  Resp 16  Ht 5\' 10"  (1.778 m)  Wt 299 lb (135.626 kg)  BMI 42.90 kg/m2  SpO2 94%     Review of Systems  Constitutional: Positive for unexpected weight change.  Gastrointestinal: Positive for diarrhea.  Musculoskeletal: Positive for gait problem.  Neurological: Positive for dizziness, tremors and weakness.  Hematological: Bruises/bleeds easily.  Psychiatric/Behavioral: Positive for suicidal ideas and dysphoric mood. The patient is nervous/anxious.   All other systems reviewed and are negative.       Objective:   Physical Exam General: Alert and oriented x 3, No apparent distress. obese  HEENT: Head is normocephalic, atraumatic, PERRLA, EOMI, sclera anicteric, oral mucosa pink and moist, dentition intact, ext ear canals clear,  Neck: Supple without JVD or lymphadenopathy  Heart: Reg rate and rhythm. No murmurs rubs or gallops  Chest: CTA bilaterally without wheezes, rales, or rhonchi; no distress  Abdomen: Soft, non-tender, non-distended, bowel sounds positive.  Extremities: No clubbing, cyanosis, or edema. Pulses are 2+  Skin: Clean and intact without signs of breakdown. A few scars noted over mid back. Multiple bruises and ecchymoses over the arms.  Neuro: Pt is cognitively appropriate with normal insight, memory, and awareness. Cranial nerves 2-12 are intact. Sensory exam is normal. Reflexes are 2+ in all 4's. Fine motor coordination is intact. No tremors. Motor function is grossly 5/5.  Musculoskeletal: elevation or right hemipelvis. Levoscoliosis from mid lumbar to mid thoracic spine. Taut and tender musculature over lower  right thoracic paraspsinals, particularly the T8-10 levels. He has pain out along the right rib cage, but no palpable tp's are found. Notable muscle spasm and triggerpoints in the right lower intrascapular paraspinals. Left knee tender with meniscal maneuvers, lateral more than medial. Mild crepitus left knee. No significant edema no instability. Some antalgia with weight bearing on the left. Bilateral shoulders mildly tender with ER and IR. No gross joint deformities seen. Grip strength slightly reduced bilaterally. He continues to display a head forward posture with kyphosis noted. dextroscoilosis of thoracic and lumbar spine seen with adjoining levoscoliosis between these areas.  Psych: Pt's affect remains flat. Pt is cooperative and overall very pleasant   Assessment & Plan:   1. Chronic thoracic and lumbar spine pain. He  has DDD of the thoracic spine on plain film which we reviewed today. The most affected levels appear to be T3-6. His MRI shows no acute disease, and the most affected levels remain below the area where is pain is presenting.  2. Myofascial pain in thoracic paraspinals, maladaptive posture syndrome  3. Lumbar-thoracic scoliosis   4. ?RA- dxed by rheumatologist.  5. Obesity  6. OA of the bilateral knees, left more than right.  7. Major depression    Plan:  1. After informed consent and preparation of the skin with isopropyl alcohol, I injected two lower, right subscapular paraspinals each with 2cc of 1% lidocaine. The patient tolerated well, and no complications were experienced. Post-injection instructions were provided.  2. Prednisone taper was repeated to help with acute pain #26  3. Reviewed posture once again. This is a major driver of his pain in addition to the psychosocial aspects of his situation. We talked about getting into the [facility] and beginning a moderate exercise program. He tells me that he's ready to make the commitment.  4. Continue hydrocodone to 7.5 for  breakthrough pain. I gave him a script for refills for next month  5. Follow up in 2 months. 30 minutes of face to face patient care time was spent during this visit. All questions were encouraged and answered.

## 2013-08-18 NOTE — Assessment & Plan Note (Addendum)
Possible stones, though little pain, for ct and refer urology, urine studies, cbc, does not appear to need antibx at this time  Note:  Total time for pt hx, exam, review of record with pt in the room, determination of diagnoses and plan for further eval and tx is > 40 min, with over 50% spent in coordination and counseling of patient

## 2013-08-18 NOTE — Progress Notes (Signed)
Subjective:    Patient ID: [name], male    DOB: Dec 21, 1955, 57 y.o.   MRN: [id number]  HPI  Here to f/u, c/o 4-6 wks of painless intemrittent mild gross hematuria. Has had some some mild lower abd pain, intermittent, though "hard to say" if new, has chronic pain.  Has no other significant hx of Gu dz except for hx of renal stones on CT 2006, has last seen urology years ago at [facility].  Pt denies fever, wt loss, night sweats, loss of appetite, or other constitutional symptoms, in fact has gained 40 lbs recently since social problems x 2 mo;  Plans to live with daughter in [place] soon as just separated from wife, prob divorce.  Currently living in camper in [place] apart from wife, perm address has not changed. Denies worsening depressive symptoms, suicidal ideation, or panic; Still sees Dr Hermelinda Medicus for [facility], and has rx hydrocodone there. Needs handicapped form. Is on coumadin - INR today 2.1 Past Medical History  Diagnosis Date  . ALLERGIC RHINITIS   . ANXIETY   . ASTHMA   . Chronic pain syndrome   . BENIGN PROSTATIC HYPERTROPHY   . DEPRESSION   . DIABETES MELLITUS, TYPE II   . DYSKINESIA, ESOPHAGUS   . FIBROMYALGIA   . GERD   . HYPERLIPIDEMIA   . HYPERTENSION   . INSOMNIA   . LOW BACK PAIN   . Morbid obesity     Anticipated LAP-BAND surgery  . NEPHROLITHIASIS, HX OF   . OBSESSIVE-COMPULSIVE DISORDER   . ORCHITIS   . PERIPHERAL EDEMA   . PLANTAR FASCIITIS, BILATERAL   . POLYARTHRALGIA   . Rash and other nonspecific skin eruption 04/12/2009  . SLEEP APNEA, OBSTRUCTIVE   . Atrial fibrillation     Initially identified 2001, recurring 2013  . Cardiomyopathy     Myoview with prior infarct- 2012; EF 25% TEE July 2013  . Syncope     Associated with atrial fibrillation 2001  . H/O hiatal hernia   . Myocardial infarction     hx of MI   Past Surgical History  Procedure Laterality Date  . Tonsillectomy    . Acne cyst removal      2 on back   . Cardioversion  04/18/2012   Procedure: CARDIOVERSION;  Surgeon: Pricilla Riffle, MD;  Location: [facility] OR;  Service: Cardiovascular;  Laterality: N/A;  . Tee without cardioversion  04/25/2012    Procedure: TRANSESOPHAGEAL ECHOCARDIOGRAM (TEE);  Surgeon: Vesta Mixer, MD;  Location: [facility] ENDOSCOPY;  Service: Cardiovascular;  Laterality: N/A;  . Cardioversion  04/25/2012    Procedure: CARDIOVERSION;  Surgeon: Vesta Mixer, MD;  Location: [facility] ENDOSCOPY;  Service: Cardiovascular;  Laterality: N/A;  . Cardioversion  04/25/2012    Procedure: CARDIOVERSION;  Surgeon: Pricilla Riffle, MD;  Location: [facility] OR;  Service: Cardiovascular;  Laterality: N/A;  . Cardioversion  05/09/2012    Procedure: CARDIOVERSION;  Surgeon: Tonny Bollman, MD;  Location: [facility] OR;  Service: Cardiovascular;  Laterality: N/A;  changed from crenshaw to cooper by trish/leone-endo    reports that he quit smoking about 5 years ago. His smoking use included Cigarettes. He has a 60 pack-year smoking history. He has never used smokeless tobacco. He reports that he does not drink alcohol or use illicit drugs. family history includes Allergies in his mother; Asthma in his maternal grandmother and mother; COPD in his mother; Colon polyps in his mother. Allergies  Allergen Reactions  . Simvastatin  REACTION: severe legs  . Statins Other (See Comments)    myalgia   Current Outpatient Prescriptions on File Prior to Visit  Medication Sig Dispense Refill  . ALPRAZolam (XANAX) 0.5 MG tablet TAKE 1 TABLET BY MOUTH TWICE A DAY AS NEEDED FOR ANXIETY  60 tablet  2  . amiodarone (PACERONE) 200 MG tablet Take 1 tablet (200 mg total) by mouth daily.  30 tablet  3  . carisoprodol (SOMA) 350 MG tablet TAKE 1 TABLET BY MOUTH 3 TIMES A DAY AS NEEDED  90 tablet  1  . clobetasol cream (TEMOVATE) 0.05 % Apply 1 application topically daily.       . clobetasol cream (TEMOVATE) 0.05 % APPLY TO AFFECTED AREA TWICE A DAY BUT DO NOT APPLY ON FACE  30 g  1  . clobetasol cream (TEMOVATE) 0.05 % APPLY  TO AFFECTED AREA TWICE A DAY BUT DO NOT APPLY ON FACE  30 g  1  . digoxin (LANOXIN) 0.125 MG tablet Take 1 tablet (0.125 mg total) by mouth daily. taking1/2daily  45 tablet  3  . doxepin (SINEQUAN) 25 MG capsule TAKE ONE CAPSULE BY MOUTH TWICE A DAY  180 capsule  0  . furosemide (LASIX) 40 MG tablet Take 1 tablet (40 mg total) by mouth 2 (two) times daily.  180 tablet  PRN  . HYDROcodone-acetaminophen (NORCO) 7.5-325 MG per tablet Take 1 tablet by mouth every 6 (six) hours as needed for pain.  90 tablet  0  . hydrocortisone 2.5 % cream Apply 1 application topically 2 (two) times daily. As needed to facial areas only  30 g  3  . ibuprofen (ADVIL,MOTRIN) 800 MG tablet TAKE 1 TABLET BY MOUTH EVERY 6 HOURS AS NEEDED  30 tablet  0  . lisinopril (PRINIVIL,ZESTRIL) 5 MG tablet Take 1 tablet (5 mg total) by mouth daily.  30 tablet  12  . metFORMIN (GLUCOPHAGE) 500 MG tablet TAKE 1 TABLET BY MOUTH TWICE A DAY WITH MEALS  60 tablet  5  . omeprazole (PRILOSEC) 20 MG capsule TAKE 2 CAPSULES BY MOUTH EVERY DAY  180 capsule  2  . omeprazole (PRILOSEC) 20 MG capsule TAKE ONE CAPSULE BY MOUTH TWICE A DAY  60 capsule  2  . sertraline (ZOLOFT) 100 MG tablet Take 2 tablets (200 mg total) by mouth daily.  180 tablet  2  . sertraline (ZOLOFT) 100 MG tablet TAKE 1 TABLET BY MOUTH TWICE A DAY  180 tablet  0  . traZODone (DESYREL) 100 MG tablet TAKE 1/2 TABLET BY MOUTH AT BEDTIME AS NEEDED  45 tablet  1  . warfarin (COUMADIN) 5 MG tablet TAKE AS DIRECTED BY ANTICOAGULATION CLINIC  90 tablet  1  . zolpidem (AMBIEN) 10 MG tablet Take 1 tablet (10 mg total) by mouth at bedtime as needed. For sleep  30 tablet  5  . carvedilol (COREG) 6.25 MG tablet Take 1 tablet (6.25 mg total) by mouth 2 (two) times daily with a meal.  180 tablet  3  . metFORMIN (GLUCOPHAGE) 500 MG tablet Take 500 mg by mouth 2 (two) times daily with a meal.      . [DISCONTINUED] atorvastatin (LIPITOR) 20 MG tablet Take 1 tablet (20 mg total) by mouth daily.   30 tablet  11  . [DISCONTINUED] gabapentin (NEURONTIN) 300 MG capsule Take 1 capsule (300 mg total) by mouth 3 (three) times daily.  90 capsule  5   No current facility-administered medications on file prior to visit.  Review of Systems  Constitutional: Negative for unexpected weight change, or unusual diaphoresis  HENT: Negative for tinnitus.   Eyes: Negative for photophobia and visual disturbance.  Respiratory: Negative for choking and stridor.   Gastrointestinal: Negative for vomiting and blood in stool.  Genitourinary: Negative for hematuria and decreased urine volume.  Musculoskeletal: Negative for acute joint swelling Skin: Negative for color change and wound.  Neurological: Negative for tremors and numbness other than noted  Psychiatric/Behavioral: Negative for decreased concentration or  hyperactivity.       Objective:   Physical Exam BP 122/78  Pulse 68  Temp(Src) 97.9 F (36.6 C) (Oral)  Ht 5\' 10"  (1.778 m)  Wt 299 lb 8 oz (135.852 kg)  BMI 42.97 kg/m2  SpO2 94% VS noted,  Constitutional: Pt appears well-developed and well-nourished.  HENT: Head: NCAT.  Right Ear: External ear normal.  Left Ear: External ear normal.  Eyes: Conjunctivae and EOM are normal. Pupils are equal, round, and reactive to light.  Neck: Normal range of motion. Neck supple.  Cardiovascular: Normal rate and regular rhythm.   Pulmonary/Chest: Effort normal and breath sounds normal.  Abd:  Soft, NT, non-distended, + BS Neurological: Pt is alert. Not confused  Skin: Skin is warm. No erythema.  Psychiatric: Pt behavior is normal. Thought content normal. + depressed affect    Assessment & Plan:

## 2013-08-19 LAB — URINE CULTURE: Colony Count: NO GROWTH

## 2013-08-20 ENCOUNTER — Encounter: Payer: Self-pay | Admitting: Internal Medicine

## 2013-08-20 ENCOUNTER — Ambulatory Visit (INDEPENDENT_AMBULATORY_CARE_PROVIDER_SITE_OTHER)
Admission: RE | Admit: 2013-08-20 | Discharge: 2013-08-20 | Disposition: A | Discharge status: Home / Self Care | Payer: Medicare HMO | Source: Ambulatory Visit | Attending: Internal Medicine | Admitting: Internal Medicine

## 2013-08-20 DIAGNOSIS — R310 Gross hematuria: Secondary | ICD-10-CM

## 2013-08-30 ENCOUNTER — Other Ambulatory Visit: Payer: Self-pay | Admitting: Internal Medicine

## 2013-09-02 ENCOUNTER — Other Ambulatory Visit: Payer: Self-pay | Admitting: Internal Medicine

## 2013-09-02 NOTE — Telephone Encounter (Signed)
Faxed hardcopy to [facility]. [place]

## 2013-09-02 NOTE — Telephone Encounter (Signed)
Done hardcopy to robin  

## 2013-09-15 ENCOUNTER — Ambulatory Visit (INDEPENDENT_AMBULATORY_CARE_PROVIDER_SITE_OTHER): Payer: Medicare HMO | Admitting: Internal Medicine

## 2013-09-15 ENCOUNTER — Encounter: Payer: Self-pay | Admitting: Internal Medicine

## 2013-09-15 VITALS — BP 126/81 | HR 73 | Ht 70.0 in | Wt 307.8 lb

## 2013-09-15 DIAGNOSIS — F3289 Other specified depressive episodes: Secondary | ICD-10-CM

## 2013-09-15 DIAGNOSIS — F329 Major depressive disorder, single episode, unspecified: Secondary | ICD-10-CM

## 2013-09-15 DIAGNOSIS — I4891 Unspecified atrial fibrillation: Secondary | ICD-10-CM

## 2013-09-15 DIAGNOSIS — I5022 Chronic systolic (congestive) heart failure: Secondary | ICD-10-CM

## 2013-09-15 DIAGNOSIS — R2689 Other abnormalities of gait and mobility: Secondary | ICD-10-CM

## 2013-09-15 DIAGNOSIS — R29818 Other symptoms and signs involving the nervous system: Secondary | ICD-10-CM

## 2013-09-15 DIAGNOSIS — I10 Essential (primary) hypertension: Secondary | ICD-10-CM

## 2013-09-15 DIAGNOSIS — I428 Other cardiomyopathies: Secondary | ICD-10-CM

## 2013-09-15 DIAGNOSIS — I452 Bifascicular block: Secondary | ICD-10-CM

## 2013-09-15 MED ORDER — AMIODARONE HCL 100 MG PO TABS: 100.0000 mg | ORAL_TABLET | Freq: Every day | ORAL | Status: DC

## 2013-09-15 NOTE — Assessment & Plan Note (Signed)
No clinical atrial fibrillation. He is having problems with balance. I wonder whether this is related to amiodarone. We will decrease 200--100 mg a day. Surveillance laboratories were normal the summer

## 2013-09-15 NOTE — Patient Instructions (Addendum)
Your physician has recommended you make the following change in your medication:  1) Decrease Amiodarone to 100 mg daily  Your physician wants you to follow-up in: 6 months with Dr. Graciela Husbands. You will receive a reminder letter in the mail two months in advance. If you don't receive a letter, please call our office to schedule the follow-up appointment.    Please contact [facility] at [phone] (they accept Medicare) to schedule appointment

## 2013-09-15 NOTE — Assessment & Plan Note (Signed)
May be aggravated by amiodarone we'll decrease the dose as noted

## 2013-09-15 NOTE — Assessment & Plan Note (Signed)
Patient has aggravated his basic depression. Will check with Dr. Jonny Ruiz as to what other thoughts he has.

## 2013-09-15 NOTE — Progress Notes (Signed)
Patient Care Team: [name], MD as PCP - General [name], RD as Dietitian   HPI  [name] is a 57 y.o. male Acute seen in followup for atrial fibrillation for which she is treated with amiodarone.  Echocardiogram 7/13 demonstrated EF 20-25% with severe biatrial enlargement. Repeat echo 8/14 and demonstrated an intercurrent normalization of LV function with biatrial enlargement and mild RV dilatation    He had right bundle branch block  He has been undergoing a divorce and has been living by himself in a trailer for the last 6 months. He is put on 50 pounds. He admits to depression which is intermittently suicidal. Hehad depression for many many years. He is on antidepressants prescribed by Dr. Jonny Ruiz.  Past Medical History  Diagnosis Date  . ALLERGIC RHINITIS   . ANXIETY   . ASTHMA   . Chronic pain syndrome   . BENIGN PROSTATIC HYPERTROPHY   . DEPRESSION   . DIABETES MELLITUS, TYPE II   . DYSKINESIA, ESOPHAGUS   . FIBROMYALGIA   . GERD   . HYPERLIPIDEMIA   . HYPERTENSION   . INSOMNIA   . LOW BACK PAIN   . Morbid obesity     Anticipated LAP-BAND surgery  . NEPHROLITHIASIS, HX OF   . OBSESSIVE-COMPULSIVE DISORDER   . ORCHITIS   . PERIPHERAL EDEMA   . PLANTAR FASCIITIS, BILATERAL   . POLYARTHRALGIA   . Rash and other nonspecific skin eruption 04/12/2009  . SLEEP APNEA, OBSTRUCTIVE   . Atrial fibrillation     Initially identified 2001, recurring 2013  . Cardiomyopathy     Myoview with prior infarct- 2012; EF 25% TEE July 2013  . Syncope     Associated with atrial fibrillation 2001  . H/O hiatal hernia   . Myocardial infarction     hx of MI    Past Surgical History  Procedure Laterality Date  . Tonsillectomy    . Acne cyst removal      2 on back   . Cardioversion  04/18/2012    Procedure: CARDIOVERSION;  Surgeon: Pricilla Riffle, MD;  Location: [facility] OR;  Service: Cardiovascular;  Laterality: N/A;  . Tee without cardioversion  04/25/2012     Procedure: TRANSESOPHAGEAL ECHOCARDIOGRAM (TEE);  Surgeon: Vesta Mixer, MD;  Location: [facility] ENDOSCOPY;  Service: Cardiovascular;  Laterality: N/A;  . Cardioversion  04/25/2012    Procedure: CARDIOVERSION;  Surgeon: Vesta Mixer, MD;  Location: [facility] ENDOSCOPY;  Service: Cardiovascular;  Laterality: N/A;  . Cardioversion  04/25/2012    Procedure: CARDIOVERSION;  Surgeon: Pricilla Riffle, MD;  Location: [facility] OR;  Service: Cardiovascular;  Laterality: N/A;  . Cardioversion  05/09/2012    Procedure: CARDIOVERSION;  Surgeon: Tonny Bollman, MD;  Location: [facility] OR;  Service: Cardiovascular;  Laterality: N/A;  changed from crenshaw to cooper by trish/leone-endo    Current Outpatient Prescriptions  Medication Sig Dispense Refill  . ALPRAZolam (XANAX) 0.5 MG tablet TAKE 1 TABLET BY MOUTH TWICE A DAY AS NEEDED FOR ANXIETY  60 tablet  2  . amiodarone (PACERONE) 200 MG tablet Take 1 tablet (200 mg total) by mouth daily.  30 tablet  3  . carisoprodol (SOMA) 350 MG tablet TAKE 1 TABLET BY MOUTH 3 TIMES A DAY AS NEEDED  90 tablet  1  . carvedilol (COREG) 6.25 MG tablet Take 1 tablet (6.25 mg total) by mouth 2 (two) times daily with a meal.  180 tablet  3  .  clobetasol cream (TEMOVATE) 0.05 % APPLY TO AFFECTED AREA TWICE A DAY BUT DO NOT APPLY ON FACE  30 g  1  . digoxin (LANOXIN) 0.125 MG tablet Take 1 tablet (0.125 mg total) by mouth daily. taking1/2daily  45 tablet  3  . doxepin (SINEQUAN) 25 MG capsule TAKE ONE CAPSULE BY MOUTH TWICE A DAY  180 capsule  0  . furosemide (LASIX) 40 MG tablet Take 1 tablet (40 mg total) by mouth 2 (two) times daily.  180 tablet  PRN  . HYDROcodone-acetaminophen (NORCO) 7.5-325 MG per tablet Take 1 tablet by mouth every 6 (six) hours as needed.  90 tablet  0  . hydrocortisone 2.5 % cream Apply 1 application topically 2 (two) times daily. As needed to facial areas only  30 g  3  . hydrocortisone 2.5 % cream APPLY TO AFFECTED AREA TWICE A DAY AS NEEDED (TO FACIAL AREAS ONLY)  28.35 g   1  . ibuprofen (ADVIL,MOTRIN) 800 MG tablet Take 1 tablet by mouth as needed.      [name] lisinopril (PRINIVIL,ZESTRIL) 5 MG tablet Take 1 tablet (5 mg total) by mouth daily.  30 tablet  12  . metFORMIN (GLUCOPHAGE) 500 MG tablet TAKE 1 TABLET BY MOUTH TWICE A DAY WITH MEALS  60 tablet  5  . omeprazole (PRILOSEC) 20 MG capsule TAKE 2 CAPSULES BY MOUTH EVERY DAY  180 capsule  2  . sertraline (ZOLOFT) 100 MG tablet Take 2 tablets (200 mg total) by mouth daily.  180 tablet  2  . traZODone (DESYREL) 100 MG tablet TAKE 1/2 TABLET BY MOUTH AT BEDTIME AS NEEDED  45 tablet  1  . warfarin (COUMADIN) 5 MG tablet TAKE AS DIRECTED BY ANTICOAGULATION CLINIC  90 tablet  1  . zolpidem (AMBIEN) 10 MG tablet Take 1 tablet (10 mg total) by mouth at bedtime as needed. For sleep  30 tablet  5  . [DISCONTINUED] atorvastatin (LIPITOR) 20 MG tablet Take 1 tablet (20 mg total) by mouth daily.  30 tablet  11  . [DISCONTINUED] gabapentin (NEURONTIN) 300 MG capsule Take 1 capsule (300 mg total) by mouth 3 (three) times daily.  90 capsule  5   No current facility-administered medications for this visit.    Allergies  Allergen Reactions  . Simvastatin     REACTION: severe leg cramps  . Statins Other (See Comments)    myalgia    Review of Systems negative except from HPI and PMH  Physical Exam BP 131/75  Pulse 65  Ht 5\' 10"  (1.778 m)  Wt 307 lb 12.8 oz (139.617 kg)  BMI 44.16 kg/m2 Well developed and well nourished in no acute distress HENT normal E scleral and icterus clear Neck Supple JVP flat; carotids brisk and full Clear to ausculation  Regular rate and rhythm, no murmurs gallops or rub Soft with active bowel sounds No clubbing cyanosis Trace Edema Alert and oriented, grossly normal motor and sensory function Affect flat Skin Warm and Dry  ECG demonstrates sinus rhythm at 65 with right bundle branch block and Northwest axis intervals 20/18/46  Assessment and  Plan

## 2013-09-18 ENCOUNTER — Telehealth: Payer: Self-pay | Admitting: Internal Medicine

## 2013-09-18 NOTE — Telephone Encounter (Signed)
Explained to patient that I would discuss this with Dr. Graciela Husbands next week. Patient agreeable to plan.

## 2013-09-18 NOTE — Telephone Encounter (Signed)
New message    Dr Graciela Husbands want him to see a mental health doctor.  Pt has [employer] ins. Someone here has to call [phone]---[employer]--Dr Andi Hence is out of network therefore, we have to call and get it approved.  Pls call pt and let him know when this is done.

## 2013-09-23 NOTE — Telephone Encounter (Signed)
Explained to patient that I spoke with his insurance provider who explains that pt does not have out-of-network options. I will discuss with Dr. Graciela Husbands again to see if there is another provider we can refer him to. Patient agreeable to plan.

## 2013-09-25 NOTE — Telephone Encounter (Signed)
Left message for pt explaining I requested in-network doctor list from his insurance company. They will fax list today, and I will discuss with doctor next week and get back with him.

## 2013-09-28 ENCOUNTER — Telehealth: Payer: Self-pay

## 2013-09-28 NOTE — Telephone Encounter (Signed)
Patient called requesting and earlier appointment because he is out of his medication and needs some.  No appointments available.  Please advise.

## 2013-09-29 ENCOUNTER — Other Ambulatory Visit: Payer: Self-pay | Admitting: Internal Medicine

## 2013-09-29 NOTE — Telephone Encounter (Signed)
Done hardcopy to robin  

## 2013-09-29 NOTE — Telephone Encounter (Signed)
Faxed hardcopy to [facility]  

## 2013-09-29 NOTE — Telephone Encounter (Signed)
Contacted patient to see what happen to [employer]. Patient said he has memory loss and he will contact his pharmacy and see. He said the bottle he has says 11/4 fill date and that he has not over took the medication.

## 2013-09-29 NOTE — Telephone Encounter (Signed)
No, this patient was given an rx for hydrocodone for December at his November appointment. What happened to these?

## 2013-10-06 ENCOUNTER — Ambulatory Visit (INDEPENDENT_AMBULATORY_CARE_PROVIDER_SITE_OTHER): Payer: Medicare HMO | Admitting: General Practice

## 2013-10-06 DIAGNOSIS — Z7901 Long term (current) use of anticoagulants: Secondary | ICD-10-CM

## 2013-10-06 DIAGNOSIS — I4891 Unspecified atrial fibrillation: Secondary | ICD-10-CM

## 2013-10-06 LAB — POCT INR: INR: 1.4

## 2013-10-06 NOTE — Progress Notes (Signed)
Pre-visit discussion using our clinic review tool. No additional management support is needed unless otherwise documented below in the visit note.  

## 2013-10-09 ENCOUNTER — Other Ambulatory Visit: Payer: Self-pay | Admitting: Internal Medicine

## 2013-10-09 NOTE — Telephone Encounter (Signed)
Done erx 

## 2013-10-11 ENCOUNTER — Other Ambulatory Visit: Payer: Self-pay | Admitting: Internal Medicine

## 2013-10-12 ENCOUNTER — Encounter: Payer: Medicare HMO | Admitting: Physical Medicine & Rehabilitation

## 2013-10-16 ENCOUNTER — Ambulatory Visit: Payer: Medicare HMO | Admitting: Physical Medicine & Rehabilitation

## 2013-10-19 ENCOUNTER — Encounter: Payer: Self-pay | Admitting: Physical Medicine & Rehabilitation

## 2013-10-19 ENCOUNTER — Encounter: Payer: Medicare HMO | Attending: Physical Medicine & Rehabilitation | Admitting: Physical Medicine & Rehabilitation

## 2013-10-19 VITALS — BP 144/63 | HR 89 | Resp 16 | Ht 70.0 in | Wt 320.0 lb

## 2013-10-19 DIAGNOSIS — M1712 Unilateral primary osteoarthritis, left knee: Secondary | ICD-10-CM

## 2013-10-19 DIAGNOSIS — I252 Old myocardial infarction: Secondary | ICD-10-CM

## 2013-10-19 DIAGNOSIS — Z79899 Other long term (current) drug therapy: Secondary | ICD-10-CM

## 2013-10-19 DIAGNOSIS — M5134 Other intervertebral disc degeneration, thoracic region: Secondary | ICD-10-CM

## 2013-10-19 DIAGNOSIS — IMO0001 Reserved for inherently not codable concepts without codable children: Secondary | ICD-10-CM

## 2013-10-19 DIAGNOSIS — F3289 Other specified depressive episodes: Secondary | ICD-10-CM

## 2013-10-19 DIAGNOSIS — M25511 Pain in right shoulder: Secondary | ICD-10-CM

## 2013-10-19 DIAGNOSIS — F329 Major depressive disorder, single episode, unspecified: Secondary | ICD-10-CM

## 2013-10-19 DIAGNOSIS — Z5181 Encounter for therapeutic drug level monitoring: Secondary | ICD-10-CM

## 2013-10-19 DIAGNOSIS — IMO0002 Reserved for concepts with insufficient information to code with codable children: Secondary | ICD-10-CM

## 2013-10-19 DIAGNOSIS — M47817 Spondylosis without myelopathy or radiculopathy, lumbosacral region: Secondary | ICD-10-CM

## 2013-10-19 DIAGNOSIS — M47814 Spondylosis without myelopathy or radiculopathy, thoracic region: Secondary | ICD-10-CM

## 2013-10-19 DIAGNOSIS — M25519 Pain in unspecified shoulder: Secondary | ICD-10-CM

## 2013-10-19 DIAGNOSIS — M1710 Unilateral primary osteoarthritis, unspecified knee: Secondary | ICD-10-CM

## 2013-10-19 MED ORDER — HYDROCODONE-ACETAMINOPHEN 7.5-325 MG PO TABS: 1.0000 | ORAL_TABLET | Freq: Four times a day (QID) | ORAL | Status: DC | PRN

## 2013-10-19 MED ORDER — PREDNISONE 20 MG PO TABS: 20.0000 mg | ORAL_TABLET | Freq: Every day | ORAL | Status: DC

## 2013-10-19 MED ORDER — TIZANIDINE HCL 2 MG PO CAPS: 2.0000 mg | ORAL_CAPSULE | Freq: Three times a day (TID) | ORAL | Status: DC | PRN

## 2013-10-19 NOTE — Progress Notes (Signed)
Subjective:    Patient ID: [name], male    DOB: 1956/06/18, 58 y.o.   MRN: [id number]  HPI  [name] is back regarding his chronic pain. He had relief with the TPI's for about a month. He states that he feels tired of going to doctors and hurting all the time. He is living with his daughter currently. He is not doing his exercises and is essentially staying at home, sedentary most of the day. He feels trapped to a degree because he doesn't have transportation as well. He is using around 3 hydrocodone per day fo rpain control. The soma helps to a certain extent.     Pain Inventory Average Pain 8 Pain Right Now 7 My pain is sharp, stabbing and aching  In the last 24 hours, has pain interfered with the following? General activity 3 Relation with others 3 Enjoyment of life 3 What TIME of day is your pain at its worst? night Sleep (in general) Fair  Pain is worse with: sitting and some activites Pain improves with: medication and injections Relief from Meds: 6  Mobility use a cane how many minutes can you walk? 10 ability to climb steps?  yes do you drive?  yes needs help with transfers Do you have any goals in this area?  yes  Function disabled: date disabled 2005 I need assistance with the following:  household duties and shopping Do you have any goals in this area?  yes  Neuro/Psych weakness trouble walking depression anxiety  Prior Studies Any changes since last visit?  no  Physicians involved in your care Any changes since last visit?  no   Family History  Problem Relation Age of Onset  . COPD Mother   . Asthma Mother   . Colon polyps Mother   . Allergies Mother   . Asthma Maternal Grandmother    History   Social History  . Marital Status: Married    Spouse Name: N/A    Number of Children: 2  . Years of Education: N/A   Occupational History  . retired/disabled. prev worked in Therapist, sports.    Social History Main Topics  . Smoking  status: Former Smoker -- 2.00 packs/day for 30 years    Types: Cigarettes    Quit date: 10/16/2007  . Smokeless tobacco: Never Used  . Alcohol Use: No  . Drug Use: No  . Sexual Activity: None   Other Topics Concern  . None   Social History Narrative   Lives alone.   Past Surgical History  Procedure Laterality Date  . Tonsillectomy    . Acne cyst removal      2 on back   . Cardioversion  04/18/2012    Procedure: CARDIOVERSION;  Surgeon: Fay Records, MD;  Location: [place];  Service: Cardiovascular;  Laterality: N/A;  . Tee without cardioversion  04/25/2012    Procedure: TRANSESOPHAGEAL ECHOCARDIOGRAM (TEE);  Surgeon: Thayer Headings, MD;  Location: [place];  Service: Cardiovascular;  Laterality: N/A;  . Cardioversion  04/25/2012    Procedure: CARDIOVERSION;  Surgeon: Thayer Headings, MD;  Location: [facility] ENDOSCOPY;  Service: Cardiovascular;  Laterality: N/A;  . Cardioversion  04/25/2012    Procedure: CARDIOVERSION;  Surgeon: Fay Records, MD;  Location: [place];  Service: Cardiovascular;  Laterality: N/A;  . Cardioversion  05/09/2012    Procedure: CARDIOVERSION;  Surgeon: Sherren Mocha, MD;  Location: [place];  Service: Cardiovascular;  Laterality: N/A;  changed from crenshaw to cooper  by trish/leone-endo   Past Medical History  Diagnosis Date  . ALLERGIC RHINITIS   . ANXIETY   . ASTHMA   . Chronic pain syndrome   . BENIGN PROSTATIC HYPERTROPHY   . DEPRESSION   . DIABETES MELLITUS, TYPE II   . DYSKINESIA, ESOPHAGUS   . FIBROMYALGIA   . GERD   . HYPERLIPIDEMIA   . HYPERTENSION   . INSOMNIA   . LOW BACK PAIN   . Morbid obesity     Anticipated LAP-BAND surgery  . NEPHROLITHIASIS, HX OF   . OBSESSIVE-COMPULSIVE DISORDER   . ORCHITIS   . PERIPHERAL EDEMA   . PLANTAR FASCIITIS, BILATERAL   . POLYARTHRALGIA   . Rash and other nonspecific skin eruption 04/12/2009  . SLEEP APNEA, OBSTRUCTIVE   . Atrial fibrillation     Initially identified 2001, recurring 2013  . Cardiomyopathy      Myoview with prior infarct- 2012; EF 25% TEE July 2013  . Syncope     Associated with atrial fibrillation 2001  . H/O hiatal hernia   . Myocardial infarction     hx of MI   BP 144/63  Pulse 89  Resp 16  Ht 5\' 10"  (1.778 m)  Wt 320 lb (145.151 kg)  BMI 45.92 kg/m2  SpO2 94%     Review of Systems  Constitutional: Positive for unexpected weight change.  Gastrointestinal: Positive for abdominal pain.  Musculoskeletal: Positive for back pain and gait problem.  Neurological: Positive for weakness.  Psychiatric/Behavioral: Positive for dysphoric mood. The patient is nervous/anxious.   All other systems reviewed and are negative.       Objective:   Physical Exam  General: Alert and oriented x 3, No apparent distress. obese  HEENT: Head is normocephalic, atraumatic, PERRLA, EOMI, sclera anicteric, oral mucosa pink and moist, dentition intact, ext ear canals clear,  Neck: Supple without JVD or lymphadenopathy  Heart: Reg rate and rhythm. No murmurs rubs or gallops  Chest: CTA bilaterally without wheezes, rales, or rhonchi; no distress  Abdomen: Soft, non-tender, non-distended, bowel sounds positive.  Extremities: No clubbing, cyanosis, or edema. Pulses are 2+  Skin: Clean and intact without signs of breakdown. A few scars noted over mid back. Multiple bruises and ecchymoses over the arms.  Neuro: Pt is cognitively appropriate with normal insight, memory, and awareness. Cranial nerves 2-12 are intact. Sensory exam is normal. Reflexes are 2+ in all 4's. Fine motor coordination is intact. No tremors. Motor function is grossly 5/5.  Musculoskeletal: elevation or right hemipelvis. Levoscoliosis from mid lumbar to mid thoracic spine. Taut and tender musculature over lower right thoracic paraspsinals, particularly the T8-10 levels. He has pain out along the right rib cage, but no palpable tp's are found. Notable muscle spasm and triggerpoints in the right thoracic to lumbar paraspinals.   Left knee tender with meniscal maneuvers, lateral more than medial. Mild crepitus left knee. No significant edema no instability. Some antalgia with weight bearing on the left.   He continues to display a head forward posture with kyphosis noted. dextroscoilosis of thoracic and lumbar spine seen with adjoining levoscoliosis between these areas.  Psych: Pt's affect remains flat. Pt is cooperative and overall very pleasant.     Assessment & Plan:   1. Chronic thoracic and lumbar spine pain. He has DDD of the thoracic spine on plain film which we reviewed today. The most affected levels appear to be T3-6. His MRI shows no acute disease, and the most affected levels remain below  the area where is pain is presenting.  2. Myofascial pain in thoracic paraspinals, maladaptive posture syndrome  3. Lumbar-thoracic scoliosis  4. ?RA- dxed by rheumatologist.  5. Obesity  6. OA of the bilateral knees, left more than right.  7. Major depression    Plan:  1. After informed consent and preparation of the skin with isopropyl alcohol, I injected two lower, right thoracic and one right lumbar paraspinals each with 2cc of 1% lidocaine. The patient tolerated well, and no complications were experienced. Post-injection instructions were provided.  2. Will add tizanidine for muscle spasm at night.  3. Reviewed posture once again. This is a major driver of his pain in addition to the psychosocial aspects of his situation. Also, discussed the importance of mood given his pain and overall appearance today. We may need to try another antidepressant but I feels that he needs a psychiatrist to manage.  4. Prednisone taper was provided again today. 5. Continue hydrocodone to 7.5 for breakthrough pain. I gave him a script for refills for next month  6. Follow up in 2 months. 30 minutes of face to face patient care time was spent during this visit. All questions were encouraged and answered.

## 2013-10-19 NOTE — Patient Instructions (Signed)
YOU NEED TO WORK ON YOUR POSTURE, REGULAR EXERCISE, HOBBIES, LEISURE ACTIVITIES, ETC TO GET YOUR MIND OFF OF YOUR PAIN!

## 2013-10-20 ENCOUNTER — Ambulatory Visit (INDEPENDENT_AMBULATORY_CARE_PROVIDER_SITE_OTHER): Payer: Medicare HMO | Admitting: General Practice

## 2013-10-20 DIAGNOSIS — I4891 Unspecified atrial fibrillation: Secondary | ICD-10-CM

## 2013-10-20 DIAGNOSIS — Z7901 Long term (current) use of anticoagulants: Secondary | ICD-10-CM

## 2013-10-20 LAB — POCT INR: INR: 1.6

## 2013-10-20 NOTE — Progress Notes (Signed)
Pre-visit discussion using our clinic review tool. No additional management support is needed unless otherwise documented below in the visit note.  

## 2013-10-23 NOTE — Telephone Encounter (Signed)
Received in-network list from pt ins. Company. Dr. [name] is looking into which to refer pt to. Left pt message that I would contact him once we have a counselor decision.

## 2013-10-27 ENCOUNTER — Other Ambulatory Visit: Payer: Self-pay | Admitting: *Deleted

## 2013-10-27 ENCOUNTER — Telehealth: Payer: Self-pay | Admitting: Internal Medicine

## 2013-10-27 DIAGNOSIS — F32A Depression, unspecified: Secondary | ICD-10-CM

## 2013-10-27 DIAGNOSIS — F329 Major depressive disorder, single episode, unspecified: Secondary | ICD-10-CM

## 2013-10-27 NOTE — Telephone Encounter (Signed)
°  Patient is returning your call, please call back.  °

## 2013-10-27 NOTE — Telephone Encounter (Signed)
Left message to call back.  Pt can be referred to therapist:  Wynelle Fanny  # [phone]  [phone]

## 2013-10-27 NOTE — Telephone Encounter (Signed)
Follow up      Calling to see if [name] is available.  Pls call when you can.

## 2013-10-27 NOTE — Telephone Encounter (Signed)
A user error has taken place: encounter opened in error, closed for administrative reasons.   See 09/18/13 note

## 2013-10-27 NOTE — Telephone Encounter (Signed)
Returning pt call. Advised of therapist referral and office will contact to make appt.  Pt agreeable to plan.

## 2013-11-06 ENCOUNTER — Other Ambulatory Visit: Payer: Self-pay | Admitting: *Deleted

## 2013-11-06 ENCOUNTER — Other Ambulatory Visit: Payer: Self-pay | Admitting: Internal Medicine

## 2013-11-06 MED ORDER — DIGOXIN 125 MCG PO TABS: 0.1250 mg | ORAL_TABLET | Freq: Every day | ORAL | Status: DC

## 2013-11-17 ENCOUNTER — Ambulatory Visit (INDEPENDENT_AMBULATORY_CARE_PROVIDER_SITE_OTHER): Payer: Medicare HMO | Admitting: General Practice

## 2013-11-17 ENCOUNTER — Telehealth: Payer: Self-pay | Admitting: Internal Medicine

## 2013-11-17 DIAGNOSIS — Z5181 Encounter for therapeutic drug level monitoring: Secondary | ICD-10-CM | POA: Insufficient documentation

## 2013-11-17 DIAGNOSIS — Z7901 Long term (current) use of anticoagulants: Secondary | ICD-10-CM

## 2013-11-17 DIAGNOSIS — I4891 Unspecified atrial fibrillation: Secondary | ICD-10-CM

## 2013-11-17 LAB — POCT INR: INR: 2.3

## 2013-11-17 NOTE — Progress Notes (Signed)
Pre-visit discussion using our clinic review tool. No additional management support is needed unless otherwise documented below in the visit note.  

## 2013-11-17 NOTE — Telephone Encounter (Signed)
New message         Referral to psychiatrist for depression that was given to pt is not in network with his insurance company. Pt needs new referral.

## 2013-11-17 NOTE — Telephone Encounter (Signed)
Left message on personal answering machine to call his ins. company to discuss qualifying in network psychiatrist (I explained to him that I am not sure what happened, since referral was made to one off the list they sent me of in network covering psychiatrist). Advised him to call me with decision and we can place referral then.

## 2013-11-22 ENCOUNTER — Other Ambulatory Visit: Payer: Self-pay | Admitting: Internal Medicine

## 2013-11-24 ENCOUNTER — Other Ambulatory Visit: Payer: Self-pay | Admitting: Internal Medicine

## 2013-11-24 NOTE — Telephone Encounter (Signed)
Done hardcopy to robin  

## 2013-11-24 NOTE — Telephone Encounter (Signed)
Faxed hardcopy to [facility] [address] [place] 

## 2013-11-24 NOTE — Telephone Encounter (Signed)
Done erx 

## 2013-11-26 ENCOUNTER — Other Ambulatory Visit: Payer: Self-pay | Admitting: Internal Medicine

## 2013-11-27 ENCOUNTER — Other Ambulatory Visit: Payer: Self-pay | Admitting: Internal Medicine

## 2013-11-30 NOTE — Telephone Encounter (Signed)
Refill request for Soma Last filled by MD on - 09/29/2013 #90 x1 Last Appt: 08/18/13 Next Appt: 02/16/14 Please advise refill?

## 2013-12-02 ENCOUNTER — Other Ambulatory Visit: Payer: Self-pay | Admitting: Internal Medicine

## 2013-12-02 NOTE — Telephone Encounter (Signed)
Did not received hardcopy 

## 2013-12-02 NOTE — Telephone Encounter (Signed)
Done hardcopy to robin  

## 2013-12-02 NOTE — Telephone Encounter (Signed)
Faxed hardcopy to [facility] [address] [place] 

## 2013-12-04 ENCOUNTER — Other Ambulatory Visit: Payer: Self-pay | Admitting: Internal Medicine

## 2013-12-04 NOTE — Telephone Encounter (Signed)
Done hardcopy to robin  

## 2013-12-04 NOTE — Telephone Encounter (Signed)
Faxed hardcopy to [facility] [address] [place] 

## 2013-12-08 ENCOUNTER — Other Ambulatory Visit: Payer: Self-pay | Admitting: Internal Medicine

## 2013-12-15 ENCOUNTER — Encounter: Payer: Self-pay | Admitting: Physical Medicine & Rehabilitation

## 2013-12-15 ENCOUNTER — Encounter: Payer: Medicare HMO | Attending: Physical Medicine & Rehabilitation | Admitting: Physical Medicine & Rehabilitation

## 2013-12-15 VITALS — BP 142/64 | HR 73 | Resp 14 | Ht 70.0 in | Wt 331.0 lb

## 2013-12-15 DIAGNOSIS — I428 Other cardiomyopathies: Secondary | ICD-10-CM

## 2013-12-15 DIAGNOSIS — Z5181 Encounter for therapeutic drug level monitoring: Secondary | ICD-10-CM | POA: Insufficient documentation

## 2013-12-15 DIAGNOSIS — M47817 Spondylosis without myelopathy or radiculopathy, lumbosacral region: Secondary | ICD-10-CM

## 2013-12-15 DIAGNOSIS — M1712 Unilateral primary osteoarthritis, left knee: Secondary | ICD-10-CM

## 2013-12-15 DIAGNOSIS — L2089 Other atopic dermatitis: Secondary | ICD-10-CM | POA: Insufficient documentation

## 2013-12-15 DIAGNOSIS — M47814 Spondylosis without myelopathy or radiculopathy, thoracic region: Secondary | ICD-10-CM

## 2013-12-15 DIAGNOSIS — IMO0002 Reserved for concepts with insufficient information to code with codable children: Secondary | ICD-10-CM

## 2013-12-15 DIAGNOSIS — Z79899 Other long term (current) drug therapy: Secondary | ICD-10-CM | POA: Insufficient documentation

## 2013-12-15 DIAGNOSIS — M1710 Unilateral primary osteoarthritis, unspecified knee: Secondary | ICD-10-CM

## 2013-12-15 DIAGNOSIS — M545 Low back pain, unspecified: Secondary | ICD-10-CM

## 2013-12-15 DIAGNOSIS — M5134 Other intervertebral disc degeneration, thoracic region: Secondary | ICD-10-CM

## 2013-12-15 MED ORDER — OXYCODONE-ACETAMINOPHEN 7.5-325 MG PO TABS: 1.0000 | ORAL_TABLET | Freq: Four times a day (QID) | ORAL | Status: DC | PRN

## 2013-12-15 NOTE — Progress Notes (Signed)
Subjective:    Patient ID: [name], male    DOB: 11-03-1955, 58 y.o.   MRN: [id number]  HPI  [name] is  Back regarding his chronic pain. He feels "miserable'. The pain seems to be lower in his back with radiation at times down the right to the belt line. His left knee has been bothering him but his back hurts more. He has good results with the injections we perform tend to help for a while, as does the steroids. He finds it difficult to do much because of the pain. The hydrocodone helps to a degree but is not giving much relief at this point.       Pain Inventory Average Pain 9 Pain Right Now 7 My pain is sharp, stabbing and aching  In the last 24 hours, has pain interfered with the following? General activity 5 Relation with others 5 Enjoyment of life 7 What TIME of day is your pain at its worst? night Sleep (in general) Fair  Pain is worse with: some activites Pain improves with: injections and other Relief from Meds: 5  Mobility walk with assistance use a cane how many minutes can you walk? 10 ability to climb steps?  yes do you drive?  yes transfers alone Do you have any goals in this area?  yes  Function disabled: date disabled 2009 I need assistance with the following:  toileting and household duties Do you have any goals in this area?  yes  Neuro/Psych bladder control problems bowel control problems trouble walking spasms confusion depression anxiety  Prior Studies Any changes since last visit?  no  Physicians involved in your care Any changes since last visit?  no   Family History  Problem Relation Age of Onset  . COPD Mother   . Asthma Mother   . Colon polyps Mother   . Allergies Mother   . Asthma Maternal Grandmother    History   Social History  . Marital Status: Married    Spouse Name: N/A    Number of Children: 2  . Years of Education: N/A   Occupational History  . retired/disabled. prev worked in Therapist, sports.     Social History Main Topics  . Smoking status: Former Smoker -- 2.00 packs/day for 30 years    Types: Cigarettes    Quit date: 10/16/2007  . Smokeless tobacco: Never Used  . Alcohol Use: No  . Drug Use: No  . Sexual Activity: None   Other Topics Concern  . None   Social History Narrative   Lives alone.   Past Surgical History  Procedure Laterality Date  . Tonsillectomy    . Acne cyst removal      2 on back   . Cardioversion  04/18/2012    Procedure: CARDIOVERSION;  Surgeon: Fay Records, MD;  Location: [place];  Service: Cardiovascular;  Laterality: N/A;  . Tee without cardioversion  04/25/2012    Procedure: TRANSESOPHAGEAL ECHOCARDIOGRAM (TEE);  Surgeon: Thayer Headings, MD;  Location: [place];  Service: Cardiovascular;  Laterality: N/A;  . Cardioversion  04/25/2012    Procedure: CARDIOVERSION;  Surgeon: Thayer Headings, MD;  Location: [facility] ENDOSCOPY;  Service: Cardiovascular;  Laterality: N/A;  . Cardioversion  04/25/2012    Procedure: CARDIOVERSION;  Surgeon: Fay Records, MD;  Location: [place];  Service: Cardiovascular;  Laterality: N/A;  . Cardioversion  05/09/2012    Procedure: CARDIOVERSION;  Surgeon: Sherren Mocha, MD;  Location: [place];  Service: Cardiovascular;  Laterality: N/A;  changed from crenshaw to cooper by trish/leone-endo   Past Medical History  Diagnosis Date  . ALLERGIC RHINITIS   . ANXIETY   . ASTHMA   . Chronic pain syndrome   . BENIGN PROSTATIC HYPERTROPHY   . DEPRESSION   . DIABETES MELLITUS, TYPE II   . DYSKINESIA, ESOPHAGUS   . FIBROMYALGIA   . GERD   . HYPERLIPIDEMIA   . HYPERTENSION   . INSOMNIA   . LOW BACK PAIN   . Morbid obesity     Anticipated LAP-BAND surgery  . NEPHROLITHIASIS, HX OF   . OBSESSIVE-COMPULSIVE DISORDER   . ORCHITIS   . PERIPHERAL EDEMA   . PLANTAR FASCIITIS, BILATERAL   . POLYARTHRALGIA   . Rash and other nonspecific skin eruption 04/12/2009  . SLEEP APNEA, OBSTRUCTIVE   . Atrial fibrillation     Initially  identified 2001, recurring 2013  . Cardiomyopathy     Myoview with prior infarct- 2012; EF 25% TEE July 2013  . Syncope     Associated with atrial fibrillation 2001  . H/O hiatal hernia   . Myocardial infarction     hx of MI   BP 142/64  Pulse 73  Resp 14  Ht 5\' 10"  (1.778 m)  Wt 331 lb (150.141 kg)  BMI 47.49 kg/m2  SpO2 90%  Opioid Risk Score:   Fall Risk Score: Moderate Fall Risk (6-13 points) (pt educated on fall risk, pt was given brochure previously)    Review of Systems  Constitutional: Positive for unexpected weight change.  Respiratory: Positive for cough, shortness of breath and wheezing.   Cardiovascular: Positive for leg swelling.  Gastrointestinal: Positive for nausea and constipation.  Genitourinary: Positive for decreased urine volume.       Bowel and bladder control problems  Musculoskeletal: Positive for back pain and gait problem.  Neurological:       Spasms  Hematological: Bruises/bleeds easily.  Psychiatric/Behavioral: Positive for confusion and dysphoric mood. The patient is nervous/anxious.   All other systems reviewed and are negative.       Objective:   Physical Exam  General: Alert and oriented x 3, No apparent distress. obese  HEENT: Head is normocephalic, atraumatic, PERRLA, EOMI, sclera anicteric, oral mucosa pink and moist, dentition intact, ext ear canals clear,  Neck: Supple without JVD or lymphadenopathy  Heart: Reg rate and rhythm. No murmurs rubs or gallops  Chest: CTA bilaterally without wheezes, rales, or rhonchi; no distress  Abdomen: Soft, non-tender, non-distended, bowel sounds positive.  Extremities: No clubbing, cyanosis, or edema. Pulses are 2+  Skin: Clean and intact without signs of breakdown. A few scars noted over mid back. Multiple bruises and ecchymoses over the arms.  Neuro: Pt is cognitively appropriate with normal insight, memory, and awareness. Cranial nerves 2-12 are intact. Sensory exam is normal. Reflexes are 2+  in all 4's. Fine motor coordination is intact. No tremors. Motor function is grossly 5/5.  Musculoskeletal: elevation or right hemipelvis still . Levoscoliosis from mid lumbar to mid thoracic spine. Thoracic paraspinals still tight. Lumbar spine painful particularly along the mid paraspinals (L3). Pain was worse with flexion than extension.  Left knee tender with meniscal maneuvers, lateral more than medial. Mild crepitus left knee. No significant edema no instability. Some antalgia with weight bearing on the left. He continues to display a head forward posture with kyphosis noted. dextroscoilosis of thoracic and lumbar spine seen with adjoining levoscoliosis between these areas.  Psych: Pt's affect remains flat. Pt  is cooperative and overall very pleasant.  Assessment & Plan:   1. Chronic thoracic and lumbar spine pain. He has DDD of the thoracic spine on plain film which we reviewed today. The most affected levels appear to be T3-6. His MRI shows no acute disease, and the most affected levels remain below the area where is pain is presenting.  2. Myofascial pain in thoracic paraspinals, maladaptive posture syndrome  3. Lumbar-thoracic scoliosis  4. ?RA- dxed by rheumatologist.  5. Obesity  6. OA of the bilateral knees, left more than right.  7. Major depression    Plan:  1.  Ordered MRI of lumbar spine to assess discs/facets more in detail. Consider block pending results  2.   tizanidine for muscle spasm at night.  3. Reviewed posture once again as well as health hygiene, weight loss, etc 4. Prednisone taper was provided again today.  5.dc'ed hydrocodone and will try percocet to better control pain for now. 6. Follow up in 1 month pending MRI result. 30 minutes of face to face patient care time was spent during this visit. All questions were encouraged and answered.

## 2013-12-15 NOTE — Patient Instructions (Signed)
YOU NEED TO INCREASE YOUR PHYSICAL ACTIVITY AND BE ACCOUNTABLE FOR YOUR HEALTH "HYGIENE"!!!

## 2013-12-17 ENCOUNTER — Other Ambulatory Visit: Payer: Self-pay | Admitting: Internal Medicine

## 2013-12-23 ENCOUNTER — Other Ambulatory Visit: Payer: Self-pay | Admitting: Physical Medicine & Rehabilitation

## 2013-12-23 ENCOUNTER — Ambulatory Visit (INDEPENDENT_AMBULATORY_CARE_PROVIDER_SITE_OTHER): Payer: Medicare HMO | Admitting: General Practice

## 2013-12-23 DIAGNOSIS — Z7901 Long term (current) use of anticoagulants: Secondary | ICD-10-CM

## 2013-12-23 DIAGNOSIS — I4891 Unspecified atrial fibrillation: Secondary | ICD-10-CM

## 2013-12-23 LAB — POCT INR: INR: 2

## 2013-12-23 NOTE — Progress Notes (Signed)
Pre visit review using our clinic review tool, if applicable. No additional management support is needed unless otherwise documented below in the visit note. 

## 2014-01-01 ENCOUNTER — Other Ambulatory Visit: Payer: Self-pay | Admitting: Internal Medicine

## 2014-01-02 ENCOUNTER — Ambulatory Visit
Admission: RE | Admit: 2014-01-02 | Discharge: 2014-01-02 | Disposition: A | Discharge status: Home / Self Care | Payer: PRIVATE HEALTH INSURANCE | Source: Ambulatory Visit | Attending: Physical Medicine & Rehabilitation | Admitting: Physical Medicine & Rehabilitation

## 2014-01-02 DIAGNOSIS — M545 Low back pain, unspecified: Secondary | ICD-10-CM

## 2014-01-02 DIAGNOSIS — M47817 Spondylosis without myelopathy or radiculopathy, lumbosacral region: Secondary | ICD-10-CM

## 2014-01-04 ENCOUNTER — Telehealth: Payer: Self-pay | Admitting: Physical Medicine & Rehabilitation

## 2014-01-04 NOTE — Telephone Encounter (Signed)
His MRI reveals: 1. At L2-3 there is a mild broad-based disc bulge with a left far  lateral disc component abutting the left extra foraminal L2 nerve  root.  2. At L3-4 there is a mild broad-based disc bulge with a left far  lateral disc component abutting the left extra foraminal L3 nerve  root.  3. At L4-L5 there is moderate bilateral facet arthropathy.  4. At L5-S1 there is a right far lateral disc osteophyte complex  abutting the extra foraminal right L5 nerve roo  Would like to discuss further with him at a follow up appt

## 2014-01-06 NOTE — Telephone Encounter (Signed)
My recommendations require a re-assessment. There are multiple areas which could be "injected".  I need to see him to decide which site would be best to address with ESI.  We could have him come in after a cancellation potentially sooner.

## 2014-01-06 NOTE — Telephone Encounter (Signed)
Informed patient of his MRI results.  He is unable to get an appointment with Dr Naaman Plummer for a month.  What are the recommendations and options for patient regarding his MRI?

## 2014-01-06 NOTE — Telephone Encounter (Signed)
Left message for patient to call office regarding his MRI results.

## 2014-01-07 NOTE — Telephone Encounter (Signed)
Left patient a voicemail to return call to clinic. 

## 2014-01-11 ENCOUNTER — Other Ambulatory Visit: Payer: Self-pay | Admitting: *Deleted

## 2014-01-11 ENCOUNTER — Encounter: Payer: Self-pay | Admitting: *Deleted

## 2014-01-11 ENCOUNTER — Encounter (HOSPITAL_BASED_OUTPATIENT_CLINIC_OR_DEPARTMENT_OTHER): Payer: Medicare HMO | Admitting: *Deleted

## 2014-01-11 VITALS — BP 143/59 | HR 67 | Resp 14

## 2014-01-11 DIAGNOSIS — M545 Low back pain, unspecified: Secondary | ICD-10-CM

## 2014-01-11 DIAGNOSIS — I428 Other cardiomyopathies: Secondary | ICD-10-CM

## 2014-01-11 DIAGNOSIS — M47817 Spondylosis without myelopathy or radiculopathy, lumbosacral region: Secondary | ICD-10-CM

## 2014-01-11 DIAGNOSIS — L209 Atopic dermatitis, unspecified: Secondary | ICD-10-CM

## 2014-01-11 DIAGNOSIS — M47814 Spondylosis without myelopathy or radiculopathy, thoracic region: Secondary | ICD-10-CM

## 2014-01-11 DIAGNOSIS — M1712 Unilateral primary osteoarthritis, left knee: Secondary | ICD-10-CM

## 2014-01-11 DIAGNOSIS — M5134 Other intervertebral disc degeneration, thoracic region: Secondary | ICD-10-CM

## 2014-01-11 DIAGNOSIS — Z5181 Encounter for therapeutic drug level monitoring: Secondary | ICD-10-CM

## 2014-01-11 DIAGNOSIS — Z79899 Other long term (current) drug therapy: Secondary | ICD-10-CM

## 2014-01-11 MED ORDER — OXYCODONE-ACETAMINOPHEN 7.5-325 MG PO TABS: 1.0000 | ORAL_TABLET | Freq: Four times a day (QID) | ORAL | Status: DC | PRN

## 2014-01-11 MED ORDER — PREDNISONE 10 MG PO TABS: 10.0000 mg | ORAL_TABLET | Freq: Every day | ORAL | Status: DC

## 2014-01-11 NOTE — Patient Instructions (Signed)
Follow up with Dr Naaman Plummer about possible knee injection

## 2014-01-11 NOTE — Progress Notes (Addendum)
[name] is here today for a refill on his oxycodone.  He filled his last rx 12/15/13 # 80  Today NV# 1. His pill count is appropriate. He is having a great deal of pain in his left knee worse than right today.  I am seeing him in conjunction with [name] NP who approved a refill on a previous rx for a prednisone dosepack 10 mg # 18 sig as directed to get him through until he can see Dr Naaman Plummer for a cortisone injection.  His pain level is 9 today.  We will try and schedule. He has not had any falls in the past couple of months.  He is a moderate fall risk.  He has an Scientist, physiological but it has not been scanned into his electronic medical record so he will need to bring a copy to the office to be scanned.  Return to see dr Naaman Plummer for possible knee injection at first availability.

## 2014-01-11 NOTE — Telephone Encounter (Signed)
rx for percocet 7.5/325 # 52 printed for [name] NP to sign for RN med refill visit 01/11/14

## 2014-01-11 NOTE — Addendum Note (Signed)
Addended by: Caro Hight on: 01/11/2014 02:25 PM   Modules accepted: Level of Service

## 2014-01-19 ENCOUNTER — Other Ambulatory Visit: Payer: Self-pay | Admitting: Internal Medicine

## 2014-01-20 ENCOUNTER — Ambulatory Visit (INDEPENDENT_AMBULATORY_CARE_PROVIDER_SITE_OTHER): Payer: Commercial Managed Care - HMO | Admitting: General Practice

## 2014-01-20 DIAGNOSIS — Z7901 Long term (current) use of anticoagulants: Secondary | ICD-10-CM

## 2014-01-20 DIAGNOSIS — Z5181 Encounter for therapeutic drug level monitoring: Secondary | ICD-10-CM

## 2014-01-20 DIAGNOSIS — I4891 Unspecified atrial fibrillation: Secondary | ICD-10-CM

## 2014-01-20 LAB — POCT INR: INR: 1.6

## 2014-01-20 NOTE — Progress Notes (Signed)
Pre visit review using our clinic review tool, if applicable. No additional management support is needed unless otherwise documented below in the visit note. 

## 2014-01-22 NOTE — Progress Notes (Signed)
Urine drug screen consistent

## 2014-01-26 ENCOUNTER — Encounter: Payer: Self-pay | Admitting: Pulmonary Disease

## 2014-01-26 ENCOUNTER — Ambulatory Visit (INDEPENDENT_AMBULATORY_CARE_PROVIDER_SITE_OTHER): Payer: Commercial Managed Care - HMO | Admitting: Pulmonary Disease

## 2014-01-26 ENCOUNTER — Ambulatory Visit (INDEPENDENT_AMBULATORY_CARE_PROVIDER_SITE_OTHER)
Admission: RE | Admit: 2014-01-26 | Discharge: 2014-01-26 | Disposition: A | Discharge status: Home / Self Care | Payer: Commercial Managed Care - HMO | Source: Ambulatory Visit | Attending: Pulmonary Disease | Admitting: Pulmonary Disease

## 2014-01-26 VITALS — BP 128/74 | HR 70 | Temp 97.8°F | Ht 70.0 in | Wt 336.6 lb

## 2014-01-26 DIAGNOSIS — J449 Chronic obstructive pulmonary disease, unspecified: Secondary | ICD-10-CM

## 2014-01-26 DIAGNOSIS — R0989 Other specified symptoms and signs involving the circulatory and respiratory systems: Secondary | ICD-10-CM

## 2014-01-26 DIAGNOSIS — R0609 Other forms of dyspnea: Secondary | ICD-10-CM

## 2014-01-26 DIAGNOSIS — R0600 Dyspnea, unspecified: Secondary | ICD-10-CM

## 2014-01-26 DIAGNOSIS — G4733 Obstructive sleep apnea (adult) (pediatric): Secondary | ICD-10-CM

## 2014-01-26 NOTE — Progress Notes (Signed)
   Subjective:    Patient ID: [name], male    DOB: [birth year], 58 y.o.   MRN: [id number]  HPI The patient comes in today for followup of his known mild COPD secondary to asthma and emphysema, as well as his obstructive sleep apnea. I have not seen the patient in over 2 years, and he has turned his oxygen therapy back in and quit using his CPAP because of poor tolerance. He is still taking his Advair, but only one time a day. He continues to have significant cardiac issues, and is now on amiodarone. He also has gained 40 pounds since the last visit. He feels that his breathing has gotten much worse with exertion, but denies any significant cough or mucus production.   Review of Systems  Constitutional: Negative for fever and unexpected weight change.  HENT: Negative for congestion, dental problem, ear pain, nosebleeds, postnasal drip, rhinorrhea, sinus pressure, sneezing, sore throat and trouble swallowing.   Eyes: Negative for redness and itching.  Respiratory: Positive for cough, chest tightness, shortness of breath and wheezing.   Cardiovascular: Positive for chest pain ( comes and goes with tightness) and leg swelling. Negative for palpitations ( afib).  Gastrointestinal: Negative for nausea and vomiting.  Genitourinary: Negative for dysuria.  Musculoskeletal: Negative for joint swelling.  Skin: Negative for rash.  Neurological: Positive for headaches.  Hematological: Does not bruise/bleed easily.  Psychiatric/Behavioral: Negative for dysphoric mood. The patient is nervous/anxious.        Objective:   Physical Exam Morbidly obese male in no acute distress Nose without purulence or discharge noted Neck without lymphadenopathy or thyromegaly Chest with fairly clear breath sounds, no wheezing Cardiac exam with distant heart sounds but sounds regular. Lower extremities with mild edema, no cyanosis Alert and oriented, moves all 4 extremities.       Assessment & Plan:

## 2014-01-26 NOTE — Assessment & Plan Note (Signed)
The patient has mild airflow obstruction on prior PFTs, and this is felt secondary to emphysema with a component of asthma. At least she has stopped smoking, and I've encouraged him to take his Advair twice a day. Will also schedule for repeat pulmonary function studies to reevaluate his worsening shortness of breath.

## 2014-01-26 NOTE — Assessment & Plan Note (Signed)
The patient has worsening dyspnea on exertion that I suspect is multifactorial. He is morbidly obese and has gained 40 pounds, and I suspect this is the biggest culprit. I suspect that his COPD he has nothing to do with this, since it was very mild in nature at his last check. Will check repeat PFTs, especially since he is on amiodarone to make sure this is not an issue.

## 2014-01-26 NOTE — Patient Instructions (Signed)
Will check oxygen level today with walking to see if you need oxygen with exertion. Will check oxygen level overnight while sleeping, since you are not able to wear cpap. Take advair one inhalation am AND pm everyday. Will schedule for breathing tests to re-evaluate your breathing, and to make sure you do not have an issue with the amiodarone. Will check cxr today, and call you with results. Work on weight loss.  Will arrange followup once we get some of your testing back.

## 2014-01-26 NOTE — Addendum Note (Signed)
Addended by: Virl Cagey on: 01/26/2014 02:33 PM   Modules accepted: Orders

## 2014-01-26 NOTE — Assessment & Plan Note (Signed)
The patient has severe obstructive sleep apnea, and has gained further weight since discontinuing his CPAP. He feels that he is intolerant of the device, and we will therefore treat him with oxygen at night once we've verified desaturation.

## 2014-01-27 ENCOUNTER — Ambulatory Visit (INDEPENDENT_AMBULATORY_CARE_PROVIDER_SITE_OTHER): Payer: Commercial Managed Care - HMO | Admitting: Pulmonary Disease

## 2014-01-27 ENCOUNTER — Telehealth: Payer: Self-pay | Admitting: Pulmonary Disease

## 2014-01-27 DIAGNOSIS — J449 Chronic obstructive pulmonary disease, unspecified: Secondary | ICD-10-CM

## 2014-01-27 DIAGNOSIS — R0609 Other forms of dyspnea: Secondary | ICD-10-CM

## 2014-01-27 DIAGNOSIS — R0989 Other specified symptoms and signs involving the circulatory and respiratory systems: Secondary | ICD-10-CM

## 2014-01-27 DIAGNOSIS — R0600 Dyspnea, unspecified: Secondary | ICD-10-CM

## 2014-01-27 LAB — PULMONARY FUNCTION TEST
DL/VA % pred: 90 %
DL/VA: 4.14 ml/min/mmHg/L
DLCO UNC % PRED: 77 %
DLCO unc: 24.13 ml/min/mmHg
FEF 25-75 Post: 1.71 L/sec
FEF 25-75 Pre: 1.24 L/sec
FEF2575-%Change-Post: 38 %
FEF2575-%PRED-POST: 58 %
FEF2575-%Pred-Pre: 42 %
FEV1-%CHANGE-POST: 11 %
FEV1-%PRED-POST: 68 %
FEV1-%Pred-Pre: 61 %
FEV1-POST: 2.41 L
FEV1-Pre: 2.17 L
FEV1FVC-%CHANGE-POST: 10 %
FEV1FVC-%PRED-PRE: 85 %
FEV6-%Change-Post: 1 %
FEV6-%PRED-PRE: 74 %
FEV6-%Pred-Post: 75 %
FEV6-Post: 3.35 L
FEV6-Pre: 3.3 L
FEV6FVC-%CHANGE-POST: 0 %
FEV6FVC-%PRED-PRE: 104 %
FEV6FVC-%Pred-Post: 105 %
FVC-%Change-Post: 0 %
FVC-%Pred-Post: 72 %
FVC-%Pred-Pre: 71 %
FVC-PRE: 3.33 L
FVC-Post: 3.35 L
PRE FEV6/FVC RATIO: 99 %
Post FEV1/FVC ratio: 72 %
Post FEV6/FVC ratio: 100 %
Pre FEV1/FVC ratio: 65 %
RV % PRED: 137 %
RV: 2.99 L
TLC % pred: 94 %
TLC: 6.41 L

## 2014-01-27 NOTE — Progress Notes (Signed)
PFT done today. 

## 2014-01-27 NOTE — Telephone Encounter (Signed)
Notes Recorded by Kathee Delton, MD on 01/26/2014 at 5:40 PM Let pt know that his cxr is unchanged from prior film.   I spoke with patient about results and he verbalized understanding and had no questions

## 2014-01-28 ENCOUNTER — Telehealth: Payer: Self-pay | Admitting: Pulmonary Disease

## 2014-01-28 ENCOUNTER — Encounter: Payer: Self-pay | Admitting: Pulmonary Disease

## 2014-01-28 ENCOUNTER — Other Ambulatory Visit: Payer: Self-pay | Admitting: Internal Medicine

## 2014-01-28 NOTE — Telephone Encounter (Signed)
Notes Recorded by Kathee Delton, MD on 01/28/2014 at 9:18 AM Let pt know that his breathing studies show that his copd is a little worse, but not a lot. It does not appear from testing that his amiodarone is causing an issue for him currently --  lmomtcb x1

## 2014-01-28 NOTE — Telephone Encounter (Signed)
I spoke with patient about results and he verbalized understanding and had no questions 

## 2014-02-03 ENCOUNTER — Telehealth: Payer: Self-pay | Admitting: *Deleted

## 2014-02-03 NOTE — Telephone Encounter (Signed)
ONO results have been received and placed in [facility] Ny[facility] Gell folder for review. Please advise, thanks.   

## 2014-02-08 ENCOUNTER — Other Ambulatory Visit: Payer: Self-pay | Admitting: Pulmonary Disease

## 2014-02-08 DIAGNOSIS — G4733 Obstructive sleep apnea (adult) (pediatric): Secondary | ICD-10-CM

## 2014-02-08 NOTE — Telephone Encounter (Signed)
Spoke with pt and notified of results and recs per Dr. Gwenette Greet Pt verbalized understanding and denied any questions.

## 2014-02-08 NOTE — Telephone Encounter (Signed)
Let pt know that he did drop his oxygen level during sleep.  An order has been sent to a dme to get him oxygen with sleep.

## 2014-02-09 ENCOUNTER — Encounter: Payer: Self-pay | Admitting: Registered Nurse

## 2014-02-09 ENCOUNTER — Encounter: Payer: Medicare HMO | Attending: Physical Medicine & Rehabilitation | Admitting: Registered Nurse

## 2014-02-09 VITALS — BP 154/63 | HR 78 | Resp 14 | Ht 70.0 in | Wt 343.0 lb

## 2014-02-09 DIAGNOSIS — M47814 Spondylosis without myelopathy or radiculopathy, thoracic region: Secondary | ICD-10-CM

## 2014-02-09 DIAGNOSIS — IMO0002 Reserved for concepts with insufficient information to code with codable children: Secondary | ICD-10-CM

## 2014-02-09 DIAGNOSIS — Z79899 Other long term (current) drug therapy: Secondary | ICD-10-CM

## 2014-02-09 DIAGNOSIS — M1710 Unilateral primary osteoarthritis, unspecified knee: Secondary | ICD-10-CM | POA: Insufficient documentation

## 2014-02-09 DIAGNOSIS — M1712 Unilateral primary osteoarthritis, left knee: Secondary | ICD-10-CM

## 2014-02-09 DIAGNOSIS — M47817 Spondylosis without myelopathy or radiculopathy, lumbosacral region: Secondary | ICD-10-CM | POA: Insufficient documentation

## 2014-02-09 DIAGNOSIS — M5134 Other intervertebral disc degeneration, thoracic region: Secondary | ICD-10-CM

## 2014-02-09 DIAGNOSIS — Z5181 Encounter for therapeutic drug level monitoring: Secondary | ICD-10-CM

## 2014-02-09 DIAGNOSIS — M545 Low back pain, unspecified: Secondary | ICD-10-CM | POA: Insufficient documentation

## 2014-02-09 DIAGNOSIS — I428 Other cardiomyopathies: Secondary | ICD-10-CM | POA: Insufficient documentation

## 2014-02-09 DIAGNOSIS — M1711 Unilateral primary osteoarthritis, right knee: Secondary | ICD-10-CM

## 2014-02-09 MED ORDER — METHYLPREDNISOLONE 4 MG PO KIT: PACK | ORAL | Status: DC

## 2014-02-09 MED ORDER — OXYCODONE-ACETAMINOPHEN 7.5-325 MG PO TABS: 1.0000 | ORAL_TABLET | Freq: Four times a day (QID) | ORAL | Status: DC | PRN

## 2014-02-09 NOTE — Progress Notes (Signed)
Subjective:    Patient ID: [name], male    DOB: October 22, 1955, 58 y.o.   MRN: [id number]  HPI: [name] is a 58 year old male who returns for follow up for chronic pain and medication refill. He says his pain is located in bilateral knees right greater than left and bilateral hips. He's complaining of intensity of pain which has impacted his activities of daily living. He rates his pain 8. He was looking for steroid injection for the right knee, he has an appointment with Dr. Naaman Plummer on Mar 12, 2014, he's hoping the injection will help since it help his left knee. His daughter came to the appointment and says her dad has become slower with his walk and notices increase in pain as well. [name] is looking for a chair lift referral was made and asking for a wheelchair. Referral was made to physical therapy for wheelchair evaluation.  He's not able to perform any exercises at this time due to intensity of pain, was encouraged to began some exercise regime at his home and he verbalized understanding. His daughter takes him out a couple times a week.  Pain Inventory Average Pain 10 Pain Right Now 8 My pain is sharp, stabbing and aching  In the last 24 hours, has pain interfered with the following? General activity 4 Relation with others 4 Enjoyment of life 2 What TIME of day is your pain at its worst? night and all the time sometimes Sleep (in general) Fair  Pain is worse with: walking, bending, sitting, inactivity and standing Pain improves with: medication Relief from Meds: 3  Mobility walk with assistance use a cane how many minutes can you walk? 20-30 ability to climb steps?  yes do you drive?  yes needs help with transfers Do you have any goals in this area?  yes  Function disabled: date disabled 2009 I need assistance with the following:  bathing, toileting and household duties Do you have any goals in this area?  yes  Neuro/Psych bladder control  problems weakness trouble walking anxiety  Prior Studies Any changes since last visit?  yes x-rays  Physicians involved in your care Any changes since last visit?  no   Family History  Problem Relation Age of Onset  . COPD Mother   . Asthma Mother   . Colon polyps Mother   . Allergies Mother   . Asthma Maternal Grandmother    History   Social History  . Marital Status: Married    Spouse Name: N/A    Number of Children: 2  . Years of Education: N/A   Occupational History  . retired/disabled. prev worked in Therapist, sports.    Social History Main Topics  . Smoking status: Former Smoker -- 2.00 packs/day for 30 years    Types: Cigarettes    Quit date: 10/16/2007  . Smokeless tobacco: Never Used  . Alcohol Use: No  . Drug Use: No  . Sexual Activity: None   Other Topics Concern  . None   Social History Narrative   Lives alone.   Past Surgical History  Procedure Laterality Date  . Tonsillectomy    . Acne cyst removal      2 on back   . Cardioversion  04/18/2012    Procedure: CARDIOVERSION;  Surgeon: Fay Records, MD;  Location: [place];  Service: Cardiovascular;  Laterality: N/A;  . Tee without cardioversion  04/25/2012    Procedure: TRANSESOPHAGEAL ECHOCARDIOGRAM (TEE);  Surgeon:  Thayer Headings, MD;  Location: [place];  Service: Cardiovascular;  Laterality: N/A;  . Cardioversion  04/25/2012    Procedure: CARDIOVERSION;  Surgeon: Thayer Headings, MD;  Location: [place];  Service: Cardiovascular;  Laterality: N/A;  . Cardioversion  04/25/2012    Procedure: CARDIOVERSION;  Surgeon: Fay Records, MD;  Location: [place];  Service: Cardiovascular;  Laterality: N/A;  . Cardioversion  05/09/2012    Procedure: CARDIOVERSION;  Surgeon: Sherren Mocha, MD;  Location: [facility] OR;  Service: Cardiovascular;  Laterality: N/A;  changed from crenshaw to cooper by trish/leone-endo   Past Medical History  Diagnosis Date  . ALLERGIC RHINITIS   . ANXIETY   . ASTHMA   . Chronic  pain syndrome   . BENIGN PROSTATIC HYPERTROPHY   . DEPRESSION   . DIABETES MELLITUS, TYPE II   . DYSKINESIA, ESOPHAGUS   . FIBROMYALGIA   . GERD   . HYPERLIPIDEMIA   . HYPERTENSION   . INSOMNIA   . LOW BACK PAIN   . Morbid obesity     Anticipated LAP-BAND surgery  . NEPHROLITHIASIS, HX OF   . OBSESSIVE-COMPULSIVE DISORDER   . ORCHITIS   . PERIPHERAL EDEMA   . PLANTAR FASCIITIS, BILATERAL   . POLYARTHRALGIA   . Rash and other nonspecific skin eruption 04/12/2009  . SLEEP APNEA, OBSTRUCTIVE   . Atrial fibrillation     Initially identified 2001, recurring 2013  . Cardiomyopathy     Myoview with prior infarct- 2012; EF 25% TEE July 2013  . Syncope     Associated with atrial fibrillation 2001  . H/O hiatal hernia   . Myocardial infarction     hx of MI   BP 154/63  Pulse 78  Resp 14  Ht 5\' 10"  (1.778 m)  Wt 343 lb (155.584 kg)  BMI 49.22 kg/m2  SpO2 93%  Opioid Risk Score:   Fall Risk Score: Moderate Fall Risk (6-13 points) (pt educated and given a brochure on fall risk previously)   Review of Systems  Constitutional: Positive for unexpected weight change.  Respiratory: Positive for shortness of breath and wheezing.   Cardiovascular: Positive for leg swelling.  Endocrine:       High blood sugar  Genitourinary:       Bladder control problems  Musculoskeletal: Positive for gait problem.  Neurological: Positive for weakness.  Hematological: Bruises/bleeds easily.  Psychiatric/Behavioral: The patient is nervous/anxious.   All other systems reviewed and are negative.      Objective:   Physical Exam  Nursing note and vitals reviewed. Constitutional: He is oriented to person, place, and time. He appears well-developed and well-nourished.  Morbid Obese  HENT:  Head: Normocephalic.  Neck: Normal range of motion. Neck supple.  Cardiovascular: Normal rate, regular rhythm and normal heart sounds.   Pitting edema to Lower extremities: 3 +/ Instructed to obtain  Diabetic socks, and keep feet elevated.  Pulmonary/Chest: Effort normal and breath sounds normal.  Musculoskeletal:  Normal Muscle Bulk: Muscle testing Reveals: Upper and Lower Extremities with full ROM Muscle strength 5/5 Bilateral Greater Trochanteric Tenderness Noted Right Patella with tenderness noted . Walks with a cane. Antalgic Gait.  Arises from chair with some difficulty noted due to pain in the right knee, weight put on left leg.  Neurological: He is alert and oriented to person, place, and time.  Skin: Skin is warm and dry.  Psychiatric: He has a normal mood and affect.          Assessment &  Plan:  1. Chronic Thoracic and Lumbar Spine Pain: Back without spinal or paraspinal tenderness noted. Continue to monitor   2. OA bilateral knees, Right more than left: Refilled: oxyCODONE 7.5/325mg  one tablet every 6 hours as needed #90.RX:  Medrol Dose pak  3. Obesity: Encourage to lose weight and start an exercise program: Verbalized understanding.   30 minutes of face to face patient care time was spent during this visit. All questions were encouraged and answered.  F/U in 1 month with Dr. Naaman Plummer

## 2014-02-10 ENCOUNTER — Telehealth: Payer: Self-pay | Admitting: Registered Nurse

## 2014-02-10 ENCOUNTER — Telehealth: Payer: Self-pay | Admitting: *Deleted

## 2014-02-10 ENCOUNTER — Other Ambulatory Visit: Payer: Self-pay | Admitting: Internal Medicine

## 2014-02-10 NOTE — Telephone Encounter (Signed)
Left message on personal identified voicemail that we have given him the appt with Dr Naaman Plummer 02/19/14 at 9:20--be here by 9am.  I asked that he call back to confirm he received the message.

## 2014-02-10 NOTE — Telephone Encounter (Signed)
I spoke to Dr. Naaman Plummer regarding [name]. We will order an X-ray of the right knee. His May appointment will be changed for an earlier slot for cortisone injection. [name] is in agreement and verbalizes understanding.

## 2014-02-10 NOTE — Addendum Note (Signed)
Addended by: Bayard Hugger on: 02/10/2014 11:30 AM   Modules accepted: Orders

## 2014-02-10 NOTE — Telephone Encounter (Signed)
Mr Garis called back and confirmed his appt

## 2014-02-13 ENCOUNTER — Other Ambulatory Visit: Payer: Self-pay | Admitting: Internal Medicine

## 2014-02-16 ENCOUNTER — Encounter: Payer: Self-pay | Admitting: Internal Medicine

## 2014-02-16 ENCOUNTER — Ambulatory Visit: Payer: Commercial Managed Care - HMO

## 2014-02-16 ENCOUNTER — Other Ambulatory Visit (INDEPENDENT_AMBULATORY_CARE_PROVIDER_SITE_OTHER): Payer: Commercial Managed Care - HMO

## 2014-02-16 ENCOUNTER — Ambulatory Visit (INDEPENDENT_AMBULATORY_CARE_PROVIDER_SITE_OTHER): Payer: Commercial Managed Care - HMO | Admitting: Internal Medicine

## 2014-02-16 ENCOUNTER — Ambulatory Visit (INDEPENDENT_AMBULATORY_CARE_PROVIDER_SITE_OTHER): Payer: Commercial Managed Care - HMO | Admitting: General Practice

## 2014-02-16 VITALS — BP 108/60 | HR 73 | Temp 98.1°F | Ht 70.0 in | Wt 334.4 lb

## 2014-02-16 DIAGNOSIS — D649 Anemia, unspecified: Secondary | ICD-10-CM

## 2014-02-16 DIAGNOSIS — Z5181 Encounter for therapeutic drug level monitoring: Secondary | ICD-10-CM

## 2014-02-16 DIAGNOSIS — F3289 Other specified depressive episodes: Secondary | ICD-10-CM

## 2014-02-16 DIAGNOSIS — Z0000 Encounter for general adult medical examination without abnormal findings: Secondary | ICD-10-CM

## 2014-02-16 DIAGNOSIS — M25561 Pain in right knee: Secondary | ICD-10-CM | POA: Insufficient documentation

## 2014-02-16 DIAGNOSIS — F329 Major depressive disorder, single episode, unspecified: Secondary | ICD-10-CM

## 2014-02-16 DIAGNOSIS — Z7901 Long term (current) use of anticoagulants: Secondary | ICD-10-CM

## 2014-02-16 DIAGNOSIS — M25562 Pain in left knee: Secondary | ICD-10-CM

## 2014-02-16 DIAGNOSIS — E119 Type 2 diabetes mellitus without complications: Secondary | ICD-10-CM

## 2014-02-16 DIAGNOSIS — I4891 Unspecified atrial fibrillation: Secondary | ICD-10-CM

## 2014-02-16 DIAGNOSIS — M25569 Pain in unspecified knee: Secondary | ICD-10-CM

## 2014-02-16 LAB — URINALYSIS, ROUTINE W REFLEX MICROSCOPIC
Bilirubin Urine: NEGATIVE
Hgb urine dipstick: NEGATIVE
Ketones, ur: NEGATIVE
LEUKOCYTES UA: NEGATIVE
NITRITE: NEGATIVE
TOTAL PROTEIN, URINE-UPE24: NEGATIVE
Urine Glucose: NEGATIVE
Urobilinogen, UA: 0.2 (ref 0.0–1.0)
pH: 6 (ref 5.0–8.0)

## 2014-02-16 LAB — CBC WITH DIFFERENTIAL/PLATELET
BASOS PCT: 0.2 % (ref 0.0–3.0)
Basophils Absolute: 0 10*3/uL (ref 0.0–0.1)
EOS PCT: 1.6 % (ref 0.0–5.0)
Eosinophils Absolute: 0.2 10*3/uL (ref 0.0–0.7)
HEMATOCRIT: 33.8 % — AB (ref 39.0–52.0)
HEMOGLOBIN: 11.4 g/dL — AB (ref 13.0–17.0)
Lymphocytes Relative: 15.4 % (ref 12.0–46.0)
Lymphs Abs: 1.6 10*3/uL (ref 0.7–4.0)
MCHC: 33.6 g/dL (ref 30.0–36.0)
MCV: 92.6 fl (ref 78.0–100.0)
MONOS PCT: 8 % (ref 3.0–12.0)
Monocytes Absolute: 0.8 10*3/uL (ref 0.1–1.0)
NEUTROS ABS: 7.9 10*3/uL — AB (ref 1.4–7.7)
Neutrophils Relative %: 74.8 % (ref 43.0–77.0)
PLATELETS: 270 10*3/uL (ref 150.0–400.0)
RBC: 3.66 Mil/uL — AB (ref 4.22–5.81)
RDW: 12.9 % (ref 11.5–15.5)
WBC: 10.6 10*3/uL — AB (ref 4.0–10.5)

## 2014-02-16 LAB — HEMOGLOBIN A1C: HEMOGLOBIN A1C: 5.6 % (ref 4.6–6.5)

## 2014-02-16 LAB — HEPATIC FUNCTION PANEL
ALBUMIN: 3.5 g/dL (ref 3.5–5.2)
ALT: 26 U/L (ref 0–53)
AST: 32 U/L (ref 0–37)
Alkaline Phosphatase: 42 U/L (ref 39–117)
BILIRUBIN TOTAL: 0.4 mg/dL (ref 0.2–1.2)
Bilirubin, Direct: 0.1 mg/dL (ref 0.0–0.3)
Total Protein: 5.5 g/dL — ABNORMAL LOW (ref 6.0–8.3)

## 2014-02-16 LAB — BASIC METABOLIC PANEL
BUN: 20 mg/dL (ref 6–23)
CALCIUM: 8.5 mg/dL (ref 8.4–10.5)
CO2: 27 mEq/L (ref 19–32)
CREATININE: 0.9 mg/dL (ref 0.4–1.5)
Chloride: 98 mEq/L (ref 96–112)
GFR: 87.25 mL/min (ref >60.00)
GLUCOSE: 82 mg/dL (ref 70–99)
Potassium: 4.6 mEq/L (ref 3.5–5.1)
Sodium: 133 mEq/L — ABNORMAL LOW (ref 135–145)

## 2014-02-16 LAB — POCT INR: INR: 1.8

## 2014-02-16 LAB — TSH: TSH: 4.11 u[IU]/mL (ref 0.35–4.50)

## 2014-02-16 LAB — LIPID PANEL
CHOLESTEROL: 209 mg/dL — AB (ref 0–200)
HDL: 45.9 mg/dL (ref >39.00)
LDL Cholesterol: 140 mg/dL — ABNORMAL HIGH (ref 0–99)
Total CHOL/HDL Ratio: 5
Triglycerides: 116 mg/dL (ref 0.0–149.0)
VLDL: 23.2 mg/dL (ref 0.0–40.0)

## 2014-02-16 LAB — PSA: PSA: 1.56 ng/mL (ref 0.10–4.00)

## 2014-02-16 LAB — MICROALBUMIN / CREATININE URINE RATIO
Creatinine,U: 52.2 mg/dL
MICROALB UR: 0.1 mg/dL (ref 0.0–1.9)
Microalb Creat Ratio: 0.2 mg/g (ref 0.0–30.0)

## 2014-02-16 NOTE — Progress Notes (Signed)
Pre visit review using our clinic review tool, if applicable. No additional management support is needed unless otherwise documented below in the visit note. 

## 2014-02-16 NOTE — Progress Notes (Signed)
Subjective:    Patient ID: [name], male    DOB: [birth year], 58 y.o.   MRN: [id number]  HPI  Here for wellness and f/u;  Overall doing ok;  Pt denies CP, worsening SOB, DOE, wheezing, orthopnea, PND, worsening LE edema, palpitations, dizziness or syncope.  Pt denies neurological change such as new headache, facial or extremity weakness.  Pt denies polydipsia, polyuria, or low sugar symptoms. Pt states overall good compliance with treatment and medications, good tolerability, and has been trying to follow lower cholesterol diet.  Pt denies worsening depressive symptoms, suicidal ideation or panic. No fever, night sweats, wt loss, loss of appetite, or other constitutional symptoms.  Pt states good ability with ADL's, has low fall risk, home safety reviewed and adequate, no other significant changes in hearing or vision, and rarely active with exercise., due to worsening right > left knee pain.  Seeing Dr Schwartz/rehab/pain management for knee and back pain, LS spine MRI mar 2015 on chart, still with medial right knee especially worse with pain last wk, may need to see ortho soon.  Also with crossed toes on right foot, known mortons neuroma, pain better after [facility] placed injections approx 2 yrs ago, holding on any surgury.  Also on o2 at night 2L per Dr Gwenette Greet.    Lives in a camper for 5 mo going through divorce, now living with daughter and soninlaw, overall better social situation for him, mentally improved. Denies worsening depressive symptoms, suicidal ideation, or panic;  Pt holding off on f/u colonoscopy, wants to work on knee pain first.  Walks with cane. Cannot seem to lose wt .  Past Medical History  Diagnosis Date  . ALLERGIC RHINITIS   . ANXIETY   . ASTHMA   . Chronic pain syndrome   . BENIGN PROSTATIC HYPERTROPHY   . DEPRESSION   . DIABETES MELLITUS, TYPE II   . DYSKINESIA, ESOPHAGUS   . FIBROMYALGIA   . GERD   . HYPERLIPIDEMIA   . HYPERTENSION   . INSOMNIA     . LOW BACK PAIN   . Morbid obesity     Anticipated LAP-BAND surgery  . NEPHROLITHIASIS, HX OF   . OBSESSIVE-COMPULSIVE DISORDER   . ORCHITIS   . PERIPHERAL EDEMA   . PLANTAR FASCIITIS, BILATERAL   . POLYARTHRALGIA   . Rash and other nonspecific skin eruption 04/12/2009  . SLEEP APNEA, OBSTRUCTIVE   . Atrial fibrillation     Initially identified 2001, recurring 2013  . Cardiomyopathy     Myoview with prior infarct- 2012; EF 25% TEE July 2013  . Syncope     Associated with atrial fibrillation 2001  . H/O hiatal hernia   . Myocardial infarction     hx of MI   Past Surgical History  Procedure Laterality Date  . Tonsillectomy    . Acne cyst removal      2 on back   . Cardioversion  04/18/2012    Procedure: CARDIOVERSION;  Surgeon: Fay Records, MD;  Location: [place];  Service: Cardiovascular;  Laterality: N/A;  . Tee without cardioversion  04/25/2012    Procedure: TRANSESOPHAGEAL ECHOCARDIOGRAM (TEE);  Surgeon: Thayer Headings, MD;  Location: [place];  Service: Cardiovascular;  Laterality: N/A;  . Cardioversion  04/25/2012    Procedure: CARDIOVERSION;  Surgeon: Thayer Headings, MD;  Location: [facility] ENDOSCOPY;  Service: Cardiovascular;  Laterality: N/A;  . Cardioversion  04/25/2012    Procedure: CARDIOVERSION;  Surgeon: Fay Records,  MD;  Location: [place];  Service: Cardiovascular;  Laterality: N/A;  . Cardioversion  05/09/2012    Procedure: CARDIOVERSION;  Surgeon: Sherren Mocha, MD;  Location: [facility] OR;  Service: Cardiovascular;  Laterality: N/A;  changed from crenshaw to cooper by trish/leone-endo    reports that he quit smoking about 6 years ago. His smoking use included Cigarettes. He has a 60 pack-year smoking history. He has never used smokeless tobacco. He reports that he does not drink alcohol or use illicit drugs. family history includes Allergies in his mother; Asthma in his maternal grandmother and mother; COPD in his mother; Colon polyps in his mother. Allergies  Allergen  Reactions  . Simvastatin     REACTION: severe leg cramps  . Statins Other (See Comments)    myalgia   Review of Systems Constitutional: Negative for increased diaphoresis, other activity, appetite or other siginficant weight change  HENT: Negative for worsening hearing loss, ear pain, facial swelling, mouth sores and neck stiffness.   Eyes: Negative for other worsening pain, redness or visual disturbance.  Respiratory: Negative for shortness of breath and wheezing.   Cardiovascular: Negative for chest pain and palpitations.  Gastrointestinal: Negative for diarrhea, blood in stool, abdominal distention or other pain Genitourinary: Negative for hematuria, flank pain or change in urine volume.  Musculoskeletal: Negative for myalgias or other joint complaints.  Skin: Negative for color change and wound.  Neurological: Negative for syncope and numbness. other than noted Hematological: Negative for adenopathy. or other swelling Psychiatric/Behavioral: Negative for hallucinations, self-injury, decreased concentration or other worsening agitation.      Objective:   Physical Exam BP 108/60  Pulse 73  Temp(Src) 98.1 F (36.7 C) (Oral)  Ht 5\' 10"  (1.778 m)  Wt 334 lb 6 oz (151.672 kg)  BMI 47.98 kg/m2  SpO2 91% VS noted,  Constitutional: Pt is oriented to person, place, and time. Appears well-developed and well-nourished.  Head: Normocephalic and atraumatic.  Right Ear: External ear normal.  Left Ear: External ear normal.  Nose: Nose normal.  Mouth/Throat: Oropharynx is clear and moist.  Eyes: Conjunctivae and EOM are normal. Pupils are equal, round, and reactive to light.  Neck: Normal range of motion. Neck supple. No JVD present. No tracheal deviation present.  Cardiovascular: Normal rate, regular rhythm, normal heart sounds and intact distal pulses.   Pulmonary/Chest: Effort normal and breath sounds without rales or wheezing  Abdominal: Soft. Bowel sounds are normal. NT. No HSM    Musculoskeletal: Normal range of motion. Exhibits no edema.  Lymphadenopathy:  Has no cervical adenopathy.  Neurological: Pt is alert and oriented to person, place, and time. Pt has normal reflexes. No cranial nerve deficit. Motor grossly intact Skin: Skin is warm and dry. No rash noted.  Psychiatric:  Has dysphoric mood and affect. Behavior is normal.     Assessment & Plan:

## 2014-02-16 NOTE — Assessment & Plan Note (Signed)
stable overall by history and exam, recent data reviewed with pt, and pt to continue medical treatment as before,  to f/u any worsening symptoms or concerns. Lab Results  Component Value Date   HGBA1C 5.4 08/18/2013

## 2014-02-16 NOTE — Assessment & Plan Note (Signed)
For f/u INR later today with coumadin clinic/cindy boyd

## 2014-02-16 NOTE — Assessment & Plan Note (Signed)
A bit less than last wk as he waited to come today, to f/u rehab/pain management on Friday this wk, but also refer to ortho/murphy wainer for after June 1, as if he cont;s to improve, he can cancel appt; cannot do before June 1 due to finances.

## 2014-02-16 NOTE — Assessment & Plan Note (Signed)
A bit lower mood today I think due to right > left knee pain, declines any further med changes, or counseling referral, verified nonsuicidal

## 2014-02-16 NOTE — Assessment & Plan Note (Signed)
Overall doing well, age appropriate education and counseling updated, referrals for preventative services and immunizations addressed, dietary and smoking counseling addressed, most recent labs reviewed.  I have personally reviewed and have noted:  1) the patient's medical and social history 2) The pt's use of alcohol, tobacco, and illicit drugs 3) The patient's current medications and supplements 4) Functional ability including ADL's, fall risk, home safety risk, hearing and visual impairment 5) Diet and physical activities 6) Evidence for depression or mood disorder 7) The patient's height, weight, and BMI have been recorded in the chart  I have made referrals, and provided counseling and education based on review of the above  

## 2014-02-16 NOTE — Patient Instructions (Signed)
Please continue all other medications as before, and refills have been done if requested. Please have the pharmacy call with any other refills you may need.  Please continue your efforts at being more active, low cholesterol diet, and weight control.  You are otherwise up to date with prevention measures today, except you are encouraged to call for the colonoscopy, and not necessarily wait until after your knee is better  Please keep your appointments with your specialists as you have planned - Dr Nickolas Madrid will be contacted regarding the referral for: orthopedic ([facility], for after June 1 at your request)  Please go to the LAB in the Basement (turn left off the elevator) for the tests to be done today  You will be contacted by phone if any changes need to be made immediately.  Otherwise, you will receive a letter about your results with an explanation, but please check with MyChart first.  Please remember to sign up for MyChart if you have not done so, as this will be important to you in the future with finding out test results, communicating by private email, and scheduling acute appointments online when needed.  Please return in 6 months, or sooner if needed, with Lab testing done 3-5 days before

## 2014-02-17 ENCOUNTER — Encounter: Payer: Self-pay | Admitting: Internal Medicine

## 2014-02-17 LAB — VITAMIN B12: Vitamin B-12: 191 pg/mL — ABNORMAL LOW (ref 211–911)

## 2014-02-17 LAB — IRON: Iron: 59 ug/dL (ref 42–165)

## 2014-02-18 ENCOUNTER — Other Ambulatory Visit: Payer: Self-pay | Admitting: Internal Medicine

## 2014-02-18 NOTE — Telephone Encounter (Signed)
Done hardcopy to robin  

## 2014-02-18 NOTE — Telephone Encounter (Signed)
Faxed hardcopy to [facility].

## 2014-02-19 ENCOUNTER — Encounter: Payer: Medicare HMO | Attending: Physical Medicine & Rehabilitation | Admitting: Physical Medicine & Rehabilitation

## 2014-02-19 ENCOUNTER — Encounter: Payer: Self-pay | Admitting: Physical Medicine & Rehabilitation

## 2014-02-19 VITALS — BP 129/56 | HR 70 | Resp 16 | Ht 70.0 in | Wt 334.0 lb

## 2014-02-19 DIAGNOSIS — M1710 Unilateral primary osteoarthritis, unspecified knee: Secondary | ICD-10-CM

## 2014-02-19 DIAGNOSIS — M1711 Unilateral primary osteoarthritis, right knee: Secondary | ICD-10-CM | POA: Insufficient documentation

## 2014-02-19 DIAGNOSIS — IMO0002 Reserved for concepts with insufficient information to code with codable children: Secondary | ICD-10-CM

## 2014-02-19 DIAGNOSIS — M1712 Unilateral primary osteoarthritis, left knee: Secondary | ICD-10-CM

## 2014-02-19 DIAGNOSIS — I428 Other cardiomyopathies: Secondary | ICD-10-CM

## 2014-02-19 DIAGNOSIS — M47817 Spondylosis without myelopathy or radiculopathy, lumbosacral region: Secondary | ICD-10-CM

## 2014-02-19 DIAGNOSIS — M545 Low back pain, unspecified: Secondary | ICD-10-CM

## 2014-02-19 DIAGNOSIS — M5134 Other intervertebral disc degeneration, thoracic region: Secondary | ICD-10-CM

## 2014-02-19 DIAGNOSIS — M47814 Spondylosis without myelopathy or radiculopathy, thoracic region: Secondary | ICD-10-CM

## 2014-02-19 DIAGNOSIS — M25569 Pain in unspecified knee: Secondary | ICD-10-CM

## 2014-02-19 DIAGNOSIS — M25561 Pain in right knee: Secondary | ICD-10-CM

## 2014-02-19 DIAGNOSIS — M25562 Pain in left knee: Secondary | ICD-10-CM

## 2014-02-19 MED ORDER — OXYCODONE-ACETAMINOPHEN 7.5-325 MG PO TABS: 1.0000 | ORAL_TABLET | Freq: Four times a day (QID) | ORAL | Status: DC | PRN

## 2014-02-19 NOTE — Progress Notes (Signed)
Subjective:    Patient ID: [name], male    DOB: [birth year], 58 y.o.   MRN: [id number]  HPI  Here for a knee injection  Pain Inventory Average Pain 10 Pain Right Now 8 My pain is sharp and stabbing  In the last 24 hours, has pain interfered with the following? General activity 2 Relation with others 5 Enjoyment of life 1 What TIME of day is your pain at its worst? constant Sleep (in general) Fair  Pain is worse with: walking, bending and sitting Pain improves with: medication and injections Relief from Meds: 4  Mobility use a cane how many minutes can you walk? 5-10 ability to climb steps?  no do you drive?  yes needs help with transfers  Function disabled: date disabled 2008 I need assistance with the following:  household duties and shopping Do you have any goals in this area?  yes  Neuro/Psych trouble walking spasms  Prior Studies x-rays CT/MRI  Physicians involved in your care Any changes since last visit?  no   Family History  Problem Relation Age of Onset  . COPD Mother   . Asthma Mother   . Colon polyps Mother   . Allergies Mother   . Asthma Maternal Grandmother    History   Social History  . Marital Status: Married    Spouse Name: N/A    Number of Children: 2  . Years of Education: N/A   Occupational History  . retired/disabled. prev worked in Therapist, sports.    Social History Main Topics  . Smoking status: Former Smoker -- 2.00 packs/day for 30 years    Types: Cigarettes    Quit date: 10/16/2007  . Smokeless tobacco: Never Used  . Alcohol Use: No  . Drug Use: No  . Sexual Activity: None   Other Topics Concern  . None   Social History Narrative   Lives alone.   Past Surgical History  Procedure Laterality Date  . Tonsillectomy    . Acne cyst removal      2 on back   . Cardioversion  04/18/2012    Procedure: CARDIOVERSION;  Surgeon: Fay Records, MD;  Location: [place];  Service: Cardiovascular;  Laterality: N/A;  .  Tee without cardioversion  04/25/2012    Procedure: TRANSESOPHAGEAL ECHOCARDIOGRAM (TEE);  Surgeon: Thayer Headings, MD;  Location: [place];  Service: Cardiovascular;  Laterality: N/A;  . Cardioversion  04/25/2012    Procedure: CARDIOVERSION;  Surgeon: Thayer Headings, MD;  Location: [facility] ENDOSCOPY;  Service: Cardiovascular;  Laterality: N/A;  . Cardioversion  04/25/2012    Procedure: CARDIOVERSION;  Surgeon: Fay Records, MD;  Location: [place];  Service: Cardiovascular;  Laterality: N/A;  . Cardioversion  05/09/2012    Procedure: CARDIOVERSION;  Surgeon: Sherren Mocha, MD;  Location: [facility] OR;  Service: Cardiovascular;  Laterality: N/A;  changed from crenshaw to cooper by trish/leone-endo   Past Medical History  Diagnosis Date  . ALLERGIC RHINITIS   . ANXIETY   . ASTHMA   . Chronic pain syndrome   . BENIGN PROSTATIC HYPERTROPHY   . DEPRESSION   . DIABETES MELLITUS, TYPE II   . DYSKINESIA, ESOPHAGUS   . FIBROMYALGIA   . GERD   . HYPERLIPIDEMIA   . HYPERTENSION   . INSOMNIA   . LOW BACK PAIN   . Morbid obesity     Anticipated LAP-BAND surgery  . NEPHROLITHIASIS, HX OF   . OBSESSIVE-COMPULSIVE DISORDER   . ORCHITIS   .  PERIPHERAL EDEMA   . PLANTAR FASCIITIS, BILATERAL   . POLYARTHRALGIA   . Rash and other nonspecific skin eruption 04/12/2009  . SLEEP APNEA, OBSTRUCTIVE   . Atrial fibrillation     Initially identified 2001, recurring 2013  . Cardiomyopathy     Myoview with prior infarct- 2012; EF 25% TEE July 2013  . Syncope     Associated with atrial fibrillation 2001  . H/O hiatal hernia   . Myocardial infarction     hx of MI   BP 129/56  Pulse 70  Resp 16  Ht 5\' 10"  (1.778 m)  Wt 334 lb (151.501 kg)  BMI 47.92 kg/m2  SpO2 93%  Opioid Risk Score:   Fall Risk Score: Moderate Fall Risk (6-13 points) (patient educated handout declined)   Review of Systems  Musculoskeletal: Positive for gait problem.  Psychiatric/Behavioral: The patient is nervous/anxious.   All  other systems reviewed and are negative.      Objective:   Physical Exam  General: Alert and oriented x 3, No apparent distress. obese  HEENT: Head is normocephalic, atraumatic, PERRLA, EOMI, sclera anicteric, oral mucosa pink and moist, dentition intact, ext ear canals clear,  Neck: Supple without JVD or lymphadenopathy  Heart: Reg rate and rhythm. No murmurs rubs or gallops  Chest: CTA bilaterally without wheezes, rales, or rhonchi; no distress  Abdomen: Soft, non-tender, non-distended, bowel sounds positive.  Extremities: No clubbing, cyanosis, or edema. Pulses are 2+  Skin: Clean and intact without signs of breakdown. A few scars noted over mid back. Multiple bruises and ecchymoses over the arms.  Neuro: Pt is cognitively appropriate with normal insight, memory, and awareness. Cranial nerves 2-12 are intact. Sensory exam is normal. Reflexes are 2+ in all 4's. Fine motor coordination is intact. No tremors. Motor function is grossly 5/5.  Musculoskeletal: elevation or right hemipelvis still . Levoscoliosis from mid lumbar to mid thoracic spine. Thoracic paraspinals still tight. Lumbar spine painful particularly along the mid paraspinals (L3). Pain was worse with flexion than extension. Left knee tender with meniscal maneuvers, lateral more than medial. Mild crepitus left knee. No significant edema no instability. Some antalgia with weight bearing on the left. He continues to display a head forward posture with kyphosis noted. dextroscoilosis of thoracic and lumbar spine seen with adjoining levoscoliosis between these areas.  Psych: Pt's affect remains flat. Pt is cooperative and overall very pleasant.  Assessment & Plan:   1. Chronic thoracic and lumbar spine pain. He has DDD of the thoracic spine on plain film which we reviewed today. The most affected levels appear to be T3-6. His MRI shows no acute disease, and the most affected levels remain below the area where is pain is presenting.  2.  Myofascial pain in thoracic paraspinals, maladaptive posture syndrome  3. Lumbar-thoracic scoliosis  4. ?RA- dxed by rheumatologist.  5. Obesity  6. OA of the bilateral knees, left more than right.  7. Major depression    Plan:  1. Consider MBB or ESI for lumbar spine 2. tizanidine for muscle spasm at night.  3. Reviewed posture once again as well as health hygiene, weight loss, etc  4.  After informed consent and preparation of the skin with betadine and isopropyl alcohol, I injected 6mg  (1cc) of celestone and 4cc of 1% lidocaine into  the right knee via lateral approach. Additionally, aspiration was performed prior to injection. The patient tolerated well, and no complications were encountered. Afterward the area was cleaned and dressed. Post- injection  instructions were provided.    5.percocet prn for breakthrough pain was refilled today.  6. Follow up in 6 weeks

## 2014-02-19 NOTE — Patient Instructions (Signed)
PLEASE CALL ME WITH ANY PROBLEMS OR QUESTIONS (#[phone]).      

## 2014-02-22 ENCOUNTER — Other Ambulatory Visit: Payer: Self-pay | Admitting: Internal Medicine

## 2014-02-22 NOTE — Telephone Encounter (Signed)
Done hardcopy to robin  

## 2014-02-23 NOTE — Telephone Encounter (Signed)
Faxed hard[facility] to [facility]

## 2014-02-24 ENCOUNTER — Other Ambulatory Visit: Payer: Self-pay | Admitting: Internal Medicine

## 2014-03-02 ENCOUNTER — Other Ambulatory Visit: Payer: Self-pay

## 2014-03-02 MED ORDER — SERTRALINE HCL 100 MG PO TABS: 200.0000 mg | ORAL_TABLET | Freq: Every day | ORAL | Status: DC

## 2014-03-07 ENCOUNTER — Other Ambulatory Visit: Payer: Self-pay | Admitting: Internal Medicine

## 2014-03-11 ENCOUNTER — Telehealth: Payer: Self-pay | Admitting: Family Medicine

## 2014-03-11 NOTE — Telephone Encounter (Signed)
Pt left vm stating he needed to reschedule his appt.  Called pt and left vm to return call.

## 2014-03-12 ENCOUNTER — Other Ambulatory Visit: Payer: Self-pay | Admitting: Physical Medicine & Rehabilitation

## 2014-03-12 ENCOUNTER — Ambulatory Visit: Payer: PRIVATE HEALTH INSURANCE | Admitting: Physical Medicine & Rehabilitation

## 2014-03-16 ENCOUNTER — Ambulatory Visit (INDEPENDENT_AMBULATORY_CARE_PROVIDER_SITE_OTHER): Payer: Commercial Managed Care - HMO | Admitting: General Practice

## 2014-03-16 DIAGNOSIS — Z7901 Long term (current) use of anticoagulants: Secondary | ICD-10-CM

## 2014-03-16 DIAGNOSIS — Z5181 Encounter for therapeutic drug level monitoring: Secondary | ICD-10-CM

## 2014-03-16 DIAGNOSIS — I4891 Unspecified atrial fibrillation: Secondary | ICD-10-CM

## 2014-03-16 LAB — POCT INR: INR: 2.9

## 2014-03-16 NOTE — Progress Notes (Signed)
Pre visit review using our clinic review tool, if applicable. No additional management support is needed unless otherwise documented below in the visit note. 

## 2014-03-17 ENCOUNTER — Telehealth: Payer: Self-pay | Admitting: Internal Medicine

## 2014-03-17 NOTE — Telephone Encounter (Signed)
New message     Pt needs a knee replacement----need clearance from Dr [name].  He said he needs a verbal OK from Dr [name] before they schedule an appt with the surgeon then they will fax clearance.dp

## 2014-03-18 ENCOUNTER — Encounter: Payer: Medicare HMO | Attending: Physical Medicine & Rehabilitation | Admitting: Registered Nurse

## 2014-03-18 ENCOUNTER — Encounter: Payer: Self-pay | Admitting: Registered Nurse

## 2014-03-18 VITALS — BP 134/42 | HR 69 | Resp 14 | Ht 70.0 in | Wt 324.0 lb

## 2014-03-18 DIAGNOSIS — M545 Low back pain, unspecified: Secondary | ICD-10-CM | POA: Insufficient documentation

## 2014-03-18 DIAGNOSIS — Z79899 Other long term (current) drug therapy: Secondary | ICD-10-CM

## 2014-03-18 DIAGNOSIS — M1710 Unilateral primary osteoarthritis, unspecified knee: Secondary | ICD-10-CM | POA: Insufficient documentation

## 2014-03-18 DIAGNOSIS — M47814 Spondylosis without myelopathy or radiculopathy, thoracic region: Secondary | ICD-10-CM | POA: Insufficient documentation

## 2014-03-18 DIAGNOSIS — M1711 Unilateral primary osteoarthritis, right knee: Secondary | ICD-10-CM

## 2014-03-18 DIAGNOSIS — M47817 Spondylosis without myelopathy or radiculopathy, lumbosacral region: Secondary | ICD-10-CM

## 2014-03-18 DIAGNOSIS — M1712 Unilateral primary osteoarthritis, left knee: Secondary | ICD-10-CM

## 2014-03-18 DIAGNOSIS — M5134 Other intervertebral disc degeneration, thoracic region: Secondary | ICD-10-CM

## 2014-03-18 DIAGNOSIS — Z5181 Encounter for therapeutic drug level monitoring: Secondary | ICD-10-CM

## 2014-03-18 DIAGNOSIS — IMO0002 Reserved for concepts with insufficient information to code with codable children: Secondary | ICD-10-CM | POA: Insufficient documentation

## 2014-03-18 DIAGNOSIS — I428 Other cardiomyopathies: Secondary | ICD-10-CM

## 2014-03-18 MED ORDER — OXYCODONE-ACETAMINOPHEN 7.5-325 MG PO TABS: 1.0000 | ORAL_TABLET | Freq: Four times a day (QID) | ORAL | Status: DC | PRN

## 2014-03-18 NOTE — Progress Notes (Signed)
Subjective:    Patient ID: [name], male    DOB: [birth year], 58 y.o.   MRN: [id number]  HPI: [name] is a 58 year old male who returns for follow up for chronic pain and medication refill. He says his pain is located in his lower back and bilateral knees.[name] He rates his pain level 9. His current exercise regime is walking his dog couple times a day. He had his initial evaluation at [facility] on 03/17/14. Next appointment with the Physician. Pain Inventory Average Pain 9 Pain Right Now 9 My pain is sharp, stabbing and aching  In the last 24 hours, has pain interfered with the following? General activity 3 Relation with others 5 Enjoyment of life 3 What TIME of day is your pain at its worst? evening,night Sleep (in general) Fair  Pain is worse with: walking, bending, sitting and standing Pain improves with: heat/ice and medication Relief from Meds: 5  Mobility walk with assistance use a cane how many minutes can you walk? 20 ability to climb steps?  no do you drive?  yes transfers alone Do you have any goals in this area?  yes  Function disabled: date disabled 2009  Neuro/Psych weakness tremor trouble walking spasms dizziness anxiety  Prior Studies Any changes since last visit?  no  Physicians involved in your care Any changes since last visit?  no   Family History  Problem Relation Age of Onset  . COPD Mother   . Asthma Mother   . Colon polyps Mother   . Allergies Mother   . Asthma Maternal Grandmother    History   Social History  . Marital Status: Married    Spouse Name: N/A    Number of Children: 2  . Years of Education: N/A   Occupational History  . retired/disabled. prev worked in Therapist, sports.    Social History Main Topics  . Smoking status: Former Smoker -- 2.00 packs/day for 30 years    Types: Cigarettes    Quit date: 10/16/2007  . Smokeless tobacco: Never Used  . Alcohol Use: No  . Drug Use: No  .  Sexual Activity: None   Other Topics Concern  . None   Social History Narrative   Lives alone.   Past Surgical History  Procedure Laterality Date  . Tonsillectomy    . Acne cyst removal      2 on back   . Cardioversion  04/18/2012    Procedure: CARDIOVERSION;  Surgeon: Fay Records, MD;  Location: [facility];  Service: Cardiovascular;  Laterality: N/A;  . Tee without cardioversion  04/25/2012    Procedure: TRANSESOPHAGEAL ECHOCARDIOGRAM (TEE);  Surgeon: Thayer Headings, MD;  Location: [facility];  Service: Cardiovascular;  Laterality: N/A;  . Cardioversion  04/25/2012    Procedure: CARDIOVERSION;  Surgeon: Thayer Headings, MD;  Location: [facility] ENDOSCOPY;  Service: Cardiovascular;  Laterality: N/A;  . Cardioversion  04/25/2012    Procedure: CARDIOVERSION;  Surgeon: Fay Records, MD;  Location: [facility];  Service: Cardiovascular;  Laterality: N/A;  . Cardioversion  05/09/2012    Procedure: CARDIOVERSION;  Surgeon: Sherren Mocha, MD;  Location: [name] OR;  Service: Cardiovascular;  Laterality: N/A;  changed from crenshaw to cooper by trish/leone-endo   Past Medical History  Diagnosis Date  . ALLERGIC RHINITIS   . ANXIETY   . ASTHMA   . Chronic pain syndrome   . BENIGN PROSTATIC HYPERTROPHY   . DEPRESSION   . DIABETES MELLITUS,  TYPE II   . DYSKINESIA, ESOPHAGUS   . FIBROMYALGIA   . GERD   . HYPERLIPIDEMIA   . HYPERTENSION   . INSOMNIA   . LOW BACK PAIN   . Morbid obesity     Anticipated LAP-BAND surgery  . NEPHROLITHIASIS, HX OF   . OBSESSIVE-COMPULSIVE DISORDER   . ORCHITIS   . PERIPHERAL EDEMA   . PLANTAR FASCIITIS, BILATERAL   . POLYARTHRALGIA   . Rash and other nonspecific skin eruption 04/12/2009  . SLEEP APNEA, OBSTRUCTIVE   . Atrial fibrillation     Initially identified 2001, recurring 2013  . Cardiomyopathy     Myoview with prior infarct- 2012; EF 25% TEE July 2013  . Syncope     Associated with atrial fibrillation 2001  . H/O hiatal hernia   . Myocardial infarction     hx  of MI   BP 134/42  Pulse 69  Resp 14  Ht 5\' 10"  (1.778 m)  Wt 324 lb (146.965 kg)  BMI 46.49 kg/m2  SpO2 94%  Opioid Risk Score:   Fall Risk Score: Moderate Fall Risk (6-13 points) (pt educated on fall risk, brochure given to pt previously)    Review of Systems  Respiratory: Positive for shortness of breath.   Cardiovascular: Positive for leg swelling.  Musculoskeletal: Positive for back pain and gait problem.  Neurological: Positive for dizziness, tremors and weakness.       Spasms  Hematological: Bruises/bleeds easily.  Psychiatric/Behavioral: The patient is nervous/anxious.   All other systems reviewed and are negative.      Objective:   Physical Exam  Nursing note and vitals reviewed. Constitutional: He is oriented to person, place, and time. He appears well-developed and well-nourished.  HENT:  Head: Normocephalic and atraumatic.  Neck: Normal range of motion. Neck supple.  Cardiovascular: Normal rate and regular rhythm.   Pulmonary/Chest: Effort normal and breath sounds normal.  Musculoskeletal:  Normal Muscle Bulk and Muscle testing Reveals: Upper Extremities: Full ROM and Muscle strength 5/5 Spine of Scapula Tenderness noted Lumbar Paraspinal Tenderness: L-3- L-5 Lower Extremities Right Flexion produces pain into Patella and tenderness noted Lateral Joint line. Full Strength 5/5 Antalgic Gait Arises from the chair with ease.  Neurological: He is alert and oriented to person, place, and time.  Skin: Skin is warm and dry.  Psychiatric: He has a normal mood and affect.          Assessment & Plan:  1. Chronic Thoracic and Lumbar Spine Pain: Encouraged to increase activity. Continue current medication and heat therapy regime.  2. OA bilateral knees, Right more than left:  Refilled: oxyCODONE 7.5/325mg  one tablet every 6 hours as needed #90. S/P Right Knee injection with celestone and lidocaine. Good relief noted. 3. Obesity: Encourage to lose weight and  start an exercise program: Verbalized understanding.   20 minutes of face to face patient care time was spent during this visit. All questions were encouraged and answered.  F/U in 1 month

## 2014-03-18 NOTE — Telephone Encounter (Signed)
Informed pt that he needs to be seen in office first. Scheduled pt to see Dr. [name] 03/23/14.  Pt is agreeable.

## 2014-03-22 ENCOUNTER — Other Ambulatory Visit: Payer: Self-pay | Admitting: Internal Medicine

## 2014-03-23 ENCOUNTER — Ambulatory Visit (INDEPENDENT_AMBULATORY_CARE_PROVIDER_SITE_OTHER): Payer: Commercial Managed Care - HMO | Admitting: Internal Medicine

## 2014-03-23 ENCOUNTER — Other Ambulatory Visit: Payer: Self-pay | Admitting: Internal Medicine

## 2014-03-23 ENCOUNTER — Encounter: Payer: Self-pay | Admitting: Internal Medicine

## 2014-03-23 VITALS — BP 136/63 | HR 64 | Ht 70.0 in | Wt 322.8 lb

## 2014-03-23 DIAGNOSIS — I10 Essential (primary) hypertension: Secondary | ICD-10-CM

## 2014-03-23 DIAGNOSIS — I452 Bifascicular block: Secondary | ICD-10-CM

## 2014-03-23 DIAGNOSIS — I4891 Unspecified atrial fibrillation: Secondary | ICD-10-CM

## 2014-03-23 DIAGNOSIS — I5022 Chronic systolic (congestive) heart failure: Secondary | ICD-10-CM

## 2014-03-23 DIAGNOSIS — I428 Other cardiomyopathies: Secondary | ICD-10-CM

## 2014-03-23 DIAGNOSIS — Z01810 Encounter for preprocedural cardiovascular examination: Secondary | ICD-10-CM

## 2014-03-23 MED ORDER — AMIODARONE HCL 100 MG PO TABS: 100.0000 mg | ORAL_TABLET | Freq: Every day | ORAL | Status: DC

## 2014-03-23 NOTE — Patient Instructions (Addendum)
Your physician has recommended you make the following change in your medication:  1) INCREASE Lasix to 80 mg twice daily for 3 days, then return to normal dosing of 40 mg twice daily  Your physician has requested that you have a lexiscan myoview. For further information please visit [email or web address]. Please follow instruction sheet, as given.  Your physician wants you to follow-up in: 6 months with Dr. [name]. You will receive a reminder letter in the mail two months in advance. If you don't receive a letter, please call our office to schedule the follow-up appointment.   Pharmacologic Stress Electrocardiogram, Care After Refer to this sheet in the next few weeks. These instructions provide you with information on caring for yourself after your procedure. Your health care provider may also give you more specific instructions. Your treatment has been planned according to current medical practices, but problems sometimes occur. Call your health care provider if you have any problems or questions after your procedure. WHAT TO EXPECT AFTER THE PROCEDURE After your procedure, you may have a brief period of:  Shortness of breath.  Dizziness.  Nausea.  Chest pain.  Headache. HOME CARE INSTRUCTIONS  You may return to your normal schedule, including diet, activities, and medicines, unless your health care provider tells you otherwise.   Keep your follow-up appointments as instructed.  You will get rid of the nuclear isotope through your urine within 24 36 hours after your test. Although your exposure to radiation during this test is small, it is recommended that you avoid close contact with young children (such as hugging and cuddling) for 12 18 hours after the test. If you are breastfeeding, do not breastfeed for 24 hours after the test.  If you plan to travel by airplane within 3 days of the test, ask your health care provider for a form to make airport staff aware of the test you had.  Airport Psychologist, forensic can sometimes pick up on the isotope used during this test. SEEK MEDICAL CARE IF:   You have signs of a reaction to the medicine used during the test. This may include red, swollen, or itchy skin.  You have persistent or worsening dizziness or lightheadedness.  You have a fast or irregular heartbeat.  You have persistent nausea or vomiting. SEEK IMMEDIATE MEDICAL CARE IF:  You develop pain or pressure in the following areas:  Chest.  Jaw or neck.  Between your shoulder blades.  Radiating down your left arm.  You faint.  You have difficulty breathing. Document Released: 07/22/2013 Document Reviewed: 06/08/2013 [facility] Patient Information 2014 [place], [place].

## 2014-03-23 NOTE — Progress Notes (Signed)
Patient Care Team: [name], MD as PCP - General Bethann Goo Himmelrich, RD as Dietitian   HPI  [name] is a 58 y.o. male   seen in followup for atrial fibrillation for which he is treated with amiodarone.  There was some concern regarding dyspnea so he underwent pulmonary function testing by Dr. Gwenette Greet. It was his impression following this testing the amiodarone was not the issue.  He is having peripheral edema. He continues to have intermittent chest pains.  He anticipating undergoing knee surgery and is here for clearance.  Echocardiogram 7/13 demonstrated EF 20-25% with severe biatrial enlargement. Repeat echo 8/14 and demonstrated an intercurrent normalization of LV function with biatrial enlargement and mild RV dilatation    Myoview 2012 demonstrated an ejection fraction of 44% with a prior inferior wall infarct  He had right bundle branch block    Past Medical History  Diagnosis Date  . ALLERGIC RHINITIS   . ANXIETY   . ASTHMA   . Chronic pain syndrome   . BENIGN PROSTATIC HYPERTROPHY   . DEPRESSION   . DIABETES MELLITUS, TYPE II   . DYSKINESIA, ESOPHAGUS   . FIBROMYALGIA   . GERD   . HYPERLIPIDEMIA   . HYPERTENSION   . INSOMNIA   . LOW BACK PAIN   . Morbid obesity     Anticipated LAP-BAND surgery  . NEPHROLITHIASIS, HX OF   . OBSESSIVE-COMPULSIVE DISORDER   . ORCHITIS   . PERIPHERAL EDEMA   . PLANTAR FASCIITIS, BILATERAL   . POLYARTHRALGIA   . Rash and other nonspecific skin eruption 04/12/2009  . SLEEP APNEA, OBSTRUCTIVE   . Atrial fibrillation     Initially identified 2001, recurring 2013  . Cardiomyopathy     Myoview with prior infarct- 2012; EF 25% TEE July 2013  . Syncope     Associated with atrial fibrillation 2001  . H/O hiatal hernia   . Myocardial infarction     hx of MI    Past Surgical History  Procedure Laterality Date  . Tonsillectomy    . Acne cyst removal      2 on back   . Cardioversion  04/18/2012   Procedure: CARDIOVERSION;  Surgeon: Fay Records, MD;  Location: [facility];  Service: Cardiovascular;  Laterality: N/A;  . Tee without cardioversion  04/25/2012    Procedure: TRANSESOPHAGEAL ECHOCARDIOGRAM (TEE);  Surgeon: Thayer Headings, MD;  Location: [facility];  Service: Cardiovascular;  Laterality: N/A;  . Cardioversion  04/25/2012    Procedure: CARDIOVERSION;  Surgeon: Thayer Headings, MD;  Location[facility] ENDOSCOPY;  Service: Cardiovascular;  Laterality: N/A;  . Cardioversion  04/25/2012    Procedure: CARDIOVERSION;  Surgeon: Fay Records, MD;  Location: [place];  Service: Cardiovascular;  Laterality: N/A;  . Cardioversion  05/09/2012    Procedure: CARDIOVERSION;  Surgeon: Sherren Mocha, MD;  Location: [place];  Service: Cardiovascular;  Laterality: N/A;  changed from crenshaw to cooper by trish/leone-endo    Current Outpatient Prescriptions  Medication Sig Dispense Refill  . ADVAIR DISKUS 250-50 MCG/DOSE AEPB TAKE 1 PUFF BY MOUTH DAILY AS DIRECTED  60 each  10  . ALPRAZolam (XANAX) 0.5 MG tablet TAKE 1 TABLET BY MOUTH TWICE A DAY AS NEEDED FOR ANXIETY  60 tablet  2  . amiodarone (PACERONE) 100 MG tablet Take 1 tablet (100 mg total) by mouth daily.  30 tablet  6  . carisoprodol (SOMA) 350 MG tablet TAKE 1 TABLET BY  MOUTH 3 TIMES A DAY AS NEEDED  90 tablet  2  . clobetasol cream (TEMOVATE) 0.05 % APPLY TO AFFECTED AREA TWICE A DAY BUT DO NOT APPLY ON FACE  30 g  1  . digoxin (LANOXIN) 0.125 MG tablet TAKE 1 TABLET (0.125 MG TOTAL) BY MOUTH DAILY.  30 tablet  3  . doxepin (SINEQUAN) 25 MG capsule TAKE ONE CAPSULE BY MOUTH TWICE A DAY  180 capsule  1  . furosemide (LASIX) 40 MG tablet TAKE 1 TABLET BY MOUTH TWICE A DAY  180 tablet  0  . hydrocortisone 2.5 % cream APPLY TO AFFECTED AREA TWICE A DAY AS NEEDED (TO FACIAL AREAS ONLY)  28.35 g  1  . lisinopril (PRINIVIL,ZESTRIL) 5 MG tablet Take 1 tablet (5 mg total) by mouth daily.  30 tablet  12  . metFORMIN (GLUCOPHAGE) 500 MG tablet TAKE 1 TABLET BY  MOUTH TWICE A DAY WITH MEALS  60 tablet  7  . methylPREDNISolone (MEDROL DOSEPAK) 4 MG tablet follow package directions  21 tablet  0  . omeprazole (PRILOSEC) 20 MG capsule TAKE ONE CAPSULE BY MOUTH TWICE A DAY  60 capsule  5  . oxyCODONE-acetaminophen (PERCOCET) 7.5-325 MG per tablet Take 1 tablet by mouth every 6 (six) hours as needed for pain. No more than 4 a day  100 tablet  0  . PROAIR HFA 108 (90 BASE) MCG/ACT inhaler INHALE 2 PUFFS INTO THE LUNGS 4 TIMES A DAY  8.5 each  10  . sertraline (ZOLOFT) 100 MG tablet Take 2 tablets (200 mg total) by mouth daily.  180 tablet  3  . tiZANidine (ZANAFLEX) 2 MG tablet TAKE 1 TABLET BY MOUTH 3 TIMES A DAY AS NEEDED FOR MUSCLE SPASMS  60 tablet  2  . traZODone (DESYREL) 100 MG tablet TAKE 1/2 TABLET BY MOUTH AT BEDTIME AS NEEDED  45 tablet  1  . warfarin (COUMADIN) 5 MG tablet TAKE 1 TABLET BY MOUTH DAILY.  120 tablet  0  . zolpidem (AMBIEN) 10 MG tablet TAKE 1 TABLET BY MOUTH AT BEDTIME AS NEEDED FOR SLEEP  30 tablet  5  . [DISCONTINUED] atorvastatin (LIPITOR) 20 MG tablet Take 1 tablet (20 mg total) by mouth daily.  30 tablet  11  . [DISCONTINUED] gabapentin (NEURONTIN) 300 MG capsule Take 1 capsule (300 mg total) by mouth 3 (three) times daily.  90 capsule  5   No current facility-administered medications for this visit.    Allergies  Allergen Reactions  . Simvastatin     REACTION: severe leg cramps  . Statins Other (See Comments)    myalgia    Review of Systems negative except from HPI and PMH  Physical Exam BP 136/63  Pulse 64  Ht 5\' 10"  (1.778 m)  Wt 322 lb 12.8 oz (146.421 kg)  BMI 46.32 kg/m2 Well developed andmorbidly obese in no acute distress HENT normal E scleral and icterus clear Neck Supple Clear to ausculation  Regular rate and rhythm, no murmurs gallops or rub Soft with active bowel sounds No clubbing cyanosis  1+Edema Alert and oriented, grossly normal motor and sensory function Affect flat Skin Warm and  Dry  ECG demonstrates sinus rhythm at 64 with right bundle branch block and Northwest axis intervals 20/17/45  Assessment and  Plan  Ischemic heart disease with prior MI by Myoview  HFrEF  Cardiomyopathy-ischemic/nonischemic  Atrial fibrillation  Dyspnea on exertion  Preoperative evaluation   There is evidence of volume overload. In the  short term we'll increase his diuretics and 40/40--80/80.  Renal function 5/15 was normal  He would like to undergo knee replacement surgery. Review of his data demonstrates a significant interval decrease 2012--2013 relatives of left ventricular function. We'll plan to undertake a Myoview scan given his limited functional capacity and the aforementioned changes.  We'll continue him on low-dose amiodarone for his atrial fibrillation. Dr. Gwenette Greet seen him in regards to this.

## 2014-03-24 ENCOUNTER — Encounter: Payer: Self-pay | Admitting: Pulmonary Disease

## 2014-03-24 NOTE — Telephone Encounter (Signed)
Called the patient informed (left message) refill was done on May 11 with 2 refills.

## 2014-03-24 NOTE — Telephone Encounter (Signed)
This is second refusal for this med  Refill Already done may 11

## 2014-03-29 ENCOUNTER — Other Ambulatory Visit: Payer: Self-pay | Admitting: Internal Medicine

## 2014-04-02 ENCOUNTER — Ambulatory Visit: Payer: Commercial Managed Care - HMO | Admitting: Registered Nurse

## 2014-04-06 ENCOUNTER — Ambulatory Visit (HOSPITAL_COMMUNITY): Payer: Medicare HMO | Attending: Cardiology | Admitting: Radiology

## 2014-04-06 VITALS — BP 125/54 | HR 61 | Ht 70.0 in | Wt 323.0 lb

## 2014-04-06 DIAGNOSIS — J4489 Other specified chronic obstructive pulmonary disease: Secondary | ICD-10-CM | POA: Insufficient documentation

## 2014-04-06 DIAGNOSIS — I482 Chronic atrial fibrillation, unspecified: Secondary | ICD-10-CM

## 2014-04-06 DIAGNOSIS — J449 Chronic obstructive pulmonary disease, unspecified: Secondary | ICD-10-CM | POA: Insufficient documentation

## 2014-04-06 DIAGNOSIS — I2510 Atherosclerotic heart disease of native coronary artery without angina pectoris: Secondary | ICD-10-CM

## 2014-04-06 DIAGNOSIS — R0602 Shortness of breath: Secondary | ICD-10-CM | POA: Insufficient documentation

## 2014-04-06 DIAGNOSIS — G4733 Obstructive sleep apnea (adult) (pediatric): Secondary | ICD-10-CM | POA: Insufficient documentation

## 2014-04-06 DIAGNOSIS — I252 Old myocardial infarction: Secondary | ICD-10-CM | POA: Insufficient documentation

## 2014-04-06 DIAGNOSIS — R079 Chest pain, unspecified: Secondary | ICD-10-CM | POA: Insufficient documentation

## 2014-04-06 DIAGNOSIS — I4891 Unspecified atrial fibrillation: Secondary | ICD-10-CM | POA: Insufficient documentation

## 2014-04-06 MED ORDER — REGADENOSON 0.4 MG/5ML IV SOLN
0.4000 mg | Freq: Once | INTRAVENOUS | Status: AC
  Administered 2014-04-06: 0.4 mg via INTRAVENOUS

## 2014-04-06 MED ORDER — TECHNETIUM TC 99M SESTAMIBI GENERIC - CARDIOLITE
33.0000 | Freq: Once | INTRAVENOUS | Status: AC | PRN
  Administered 2014-04-06: 33 via INTRAVENOUS

## 2014-04-06 NOTE — Progress Notes (Signed)
[facility] NUCLEAR [facility] [address] [place], [place] [address] [phone]    Cardiology Nuclear Med Study  [name] is a 58 y.o. male     MRN : [id number]     DOB: [birth year]  Procedure Date: 04/06/2014  Nuclear Med Background Indication for Stress Test:  Evaluation for Ischemia, and Pending Surgical Clearance for  (R) TKR by Edmonia Lynch, MD History:  CAD, MI, Afib (hx cardioversion), OSA, Echo 2014 EF 60%, MPI 2012 EF 44% (scar), COPD Cardiac Risk Factors: History of Smoking, Hypertension, Lipids, NIDDM and RBBB  Symptoms:  Chest Pain (last date of chest discomfort was last Sunday) and SOB   Nuclear Pre-Procedure Caffeine/Decaff Intake:  None> 12 hrs NPO After: 6:00am   Lungs:  clear O2 Sat: 91% on room air. IV 0.9% NS with Angio Cath:  22g  IV Site: R Antecubital x 1, tolerated well IV Started by:  Irven Baltimore, RN  Chest Size (in):  56 Cup Size: n/a  Height: 5\' 10"  (1.778 m)  Weight:  323 lb (146.512 kg)  BMI:  Body mass index is 46.35 kg/(m^2). Tech Comments:  N/A    Nuclear Med Study 1 or 2 day study: 2 day  Stress Test Type:  Lexiscan  Reading MD: N/A  Order Authorizing Provider:  Virl Axe, MD  Resting Radionuclide: Technetium 3m Sestamibi  Resting Radionuclide Dose: 33.0 mCi  04/07/14  Stress Radionuclide:  Technetium 72m Sestamibi  Stress Radionuclide Dose: 33.0 mCi  04/06/14          Stress Protocol Rest HR: 61 Stress HR: 75  Rest BP: 125/54 Stress BP: 137/36  Exercise Time (min): n/a METS: n/a           Dose of Adenosine (mg):  n/a Dose of Lexiscan: 0.4 mg  Dose of Atropine (mg): n/a Dose of Dobutamine: n/a mcg/kg/min (at max HR)  Stress Test Technologist: Glade Lloyd, BS-ES  Nuclear Technologist:  Charlton Amor, CNMT     Rest Procedure:  Myocardial perfusion imaging was performed at rest 45 minutes following the intravenous administration of Technetium 23m Sestamibi. Rest ECG: NSR-RBBB  Stress Procedure:  The  patient received IV Lexiscan 0.4 mg over 15-seconds.  Technetium 76m Sestamibi injected at 30-seconds.  Quantitative spect images were obtained after a 45 minute delay.  During the infusion of Lexiscan the patient complained of SOB, head pressure and dizziness.  These symptoms were slowly easing in recovery.  Stress ECG: No significant change from baseline ECG  QPS Raw Data Images:  Mild diaphragmatic attenuation.  Normal left ventricular size. Stress Images:  There is decreased uptake in the inferior wall. Rest Images:  There is decreased uptake in the inferior wall. Subtraction (SDS):  No evidence of ischemia. Transient Ischemic Dilatation (Normal <1.22):  0.99 Lung/Heart Ratio (Normal <0.45):  0.33  Quantitative Gated Spect Images QGS EDV:  257 ml QGS ESV:  111 ml  Impression Exercise Capacity:  Lexiscan with no exercise. BP Response:  Normal blood pressure response. Clinical Symptoms:  No significant symptoms noted. ECG Impression:  No significant ST segment change suggestive of ischemia. Comparison with Prior Nuclear Study: No images to compare  Overall Impression:  Low risk stress nuclear study with fixed inferior bowel attenuation artifact.  LV Ejection Fraction: 57%.  LV Wall Motion:  NL LV Function; NL Wall Motion  Pixie Casino, MD, [facility] Board Certified in Nuclear Cardiology Attending Cardiologist [name]

## 2014-04-07 ENCOUNTER — Ambulatory Visit (HOSPITAL_COMMUNITY): Payer: Medicare HMO | Attending: Cardiology

## 2014-04-07 DIAGNOSIS — R0989 Other specified symptoms and signs involving the circulatory and respiratory systems: Secondary | ICD-10-CM

## 2014-04-07 MED ORDER — TECHNETIUM TC 99M SESTAMIBI GENERIC - CARDIOLITE
33.0000 | Freq: Once | INTRAVENOUS | Status: AC | PRN
Start: 2014-04-07 — End: 2014-04-07
  Administered 2014-04-07: 33 via INTRAVENOUS

## 2014-04-09 ENCOUNTER — Telehealth: Payer: Self-pay | Admitting: *Deleted

## 2014-04-09 NOTE — Telephone Encounter (Signed)
pt notified about myoview, he asked if a copy could be sent to orthopedic . I said I will let Dr. Olin Pia RN know this. Pt said ok and thank you.

## 2014-04-16 ENCOUNTER — Other Ambulatory Visit: Payer: Self-pay | Admitting: Internal Medicine

## 2014-04-19 ENCOUNTER — Telehealth: Payer: Self-pay | Admitting: Internal Medicine

## 2014-04-19 NOTE — Telephone Encounter (Signed)
Advised pt to call Dr Percell Miller office and have them send [place] clearance request and we will fill out form. He is going to call them.

## 2014-04-19 NOTE — Telephone Encounter (Signed)
New message    patient calling status of cardiac clearance. Patient called Dr. Percell Miller office to see if clearance was sent over They stated no.

## 2014-04-20 ENCOUNTER — Ambulatory Visit (INDEPENDENT_AMBULATORY_CARE_PROVIDER_SITE_OTHER): Payer: Commercial Managed Care - HMO | Admitting: General Practice

## 2014-04-20 ENCOUNTER — Encounter: Payer: Self-pay | Admitting: Internal Medicine

## 2014-04-20 ENCOUNTER — Ambulatory Visit (INDEPENDENT_AMBULATORY_CARE_PROVIDER_SITE_OTHER): Payer: Commercial Managed Care - HMO | Admitting: Internal Medicine

## 2014-04-20 VITALS — BP 110/60 | HR 73 | Temp 97.6°F | Wt 325.8 lb

## 2014-04-20 DIAGNOSIS — J309 Allergic rhinitis, unspecified: Secondary | ICD-10-CM

## 2014-04-20 DIAGNOSIS — I4891 Unspecified atrial fibrillation: Secondary | ICD-10-CM

## 2014-04-20 DIAGNOSIS — I10 Essential (primary) hypertension: Secondary | ICD-10-CM

## 2014-04-20 DIAGNOSIS — R21 Rash and other nonspecific skin eruption: Secondary | ICD-10-CM

## 2014-04-20 DIAGNOSIS — Z7901 Long term (current) use of anticoagulants: Secondary | ICD-10-CM

## 2014-04-20 DIAGNOSIS — Z5181 Encounter for therapeutic drug level monitoring: Secondary | ICD-10-CM

## 2014-04-20 DIAGNOSIS — E119 Type 2 diabetes mellitus without complications: Secondary | ICD-10-CM

## 2014-04-20 LAB — POCT INR: INR: 3.5

## 2014-04-20 MED ORDER — METHYLPREDNISOLONE ACETATE 80 MG/ML IJ SUSP
80.0000 mg | Freq: Once | INTRAMUSCULAR | Status: AC
  Administered 2014-04-20: 80 mg via INTRAMUSCULAR

## 2014-04-20 MED ORDER — PREDNISONE 10 MG PO TABS: ORAL_TABLET | ORAL | Status: DC

## 2014-04-20 NOTE — Progress Notes (Signed)
Subjective:    Patient ID: [name], male    DOB: [birth year], 58 y.o.   MRN: [id number]  HPI  Here with 2 wks onset diffuse rash to neck/torso/prox extrem's, itchy, without pain, fever, swelling of any kind. Pt denies chest pain, increased sob or doe, wheezing, orthopnea, PND, increased LE swelling, palpitations, dizziness or syncope.   Pt denies polydipsia, polyuria,  Does have several wks ongoing nasal allergy symptoms with clearish congestion, itch and sneezing, without fever, pain, ST, cough, swelling or wheezing. Not well controlled with current tx.   Past Medical History  Diagnosis Date  . ALLERGIC RHINITIS   . ANXIETY   . ASTHMA   . Chronic pain syndrome   . BENIGN PROSTATIC HYPERTROPHY   . DEPRESSION   . DIABETES MELLITUS, TYPE II   . DYSKINESIA, ESOPHAGUS   . FIBROMYALGIA   . GERD   . HYPERLIPIDEMIA   . HYPERTENSION   . INSOMNIA   . LOW BACK PAIN   . Morbid obesity     Anticipated LAP-BAND surgery  . NEPHROLITHIASIS, HX OF   . OBSESSIVE-COMPULSIVE DISORDER   . ORCHITIS   . PERIPHERAL EDEMA   . PLANTAR FASCIITIS, BILATERAL   . POLYARTHRALGIA   . Rash and other nonspecific skin eruption 04/12/2009  . SLEEP APNEA, OBSTRUCTIVE   . Atrial fibrillation     Initially identified 2001, recurring 2013  . Cardiomyopathy     Myoview with prior infarct- 2012; EF 25% TEE July 2013  . Syncope     Associated with atrial fibrillation 2001  . H/O hiatal hernia   . Myocardial infarction     hx of MI   Past Surgical History  Procedure Laterality Date  . Tonsillectomy    . Acne cyst removal      2 on back   . Cardioversion  04/18/2012    Procedure: CARDIOVERSION;  Surgeon: Fay Records, MD;  Location: [place];  Service: Cardiovascular;  Laterality: N/A;  . Tee without cardioversion  04/25/2012    Procedure: TRANSESOPHAGEAL ECHOCARDIOGRAM (TEE);  Surgeon: Thayer Headings, MD;  Location: [place];  Service: Cardiovascular;  Laterality: N/A;  . Cardioversion  04/25/2012   Procedure: CARDIOVERSION;  Surgeon: Thayer Headings, MD;  Location: [place];  Service: Cardiovascular;  Laterality: N/A;  . Cardioversion  04/25/2012    Procedure: CARDIOVERSION;  Surgeon: Fay Records, MD;  Location: [place];  Service: Cardiovascular;  Laterality: N/A;  . Cardioversion  05/09/2012    Procedure: CARDIOVERSION;  Surgeon: Sherren Mocha, MD;  Location: [facility];  Service: Cardiovascular;  Laterality: N/A;  changed from crenshaw to cooper by trish/leone-endo    reports that he quit smoking about 6 years ago. His smoking use included Cigarettes. He has a 60 pack-year smoking history. He has never used smokeless tobacco. He reports that he does not drink alcohol or use illicit drugs. family history includes Allergies in his mother; Asthma in his maternal grandmother and mother; COPD in his mother; Colon polyps in his mother. Allergies  Allergen Reactions  . Simvastatin     REACTION: severe leg cramps  . Statins Other (See Comments)    myalgia   Current Outpatient Prescriptions on File Prior to Visit  Medication Sig Dispense Refill  . ADVAIR DISKUS 250-50 MCG/DOSE AEPB TAKE 1 PUFF BY MOUTH DAILY AS DIRECTED  60 each  10  . ALPRAZolam (XANAX) 0.5 MG tablet TAKE 1 TABLET BY MOUTH TWICE A DAY AS NEEDED FOR ANXIETY  60  tablet  2  . amiodarone (PACERONE) 100 MG tablet Take 1 tablet (100 mg total) by mouth daily.  30 tablet  6  . carisoprodol (SOMA) 350 MG tablet TAKE 1 TABLET BY MOUTH 3 TIMES A DAY AS NEEDED  90 tablet  2  . clobetasol cream (TEMOVATE) 0.05 % APPLY TO AFFECTED AREA TWICE A DAY BUT DO NOT APPLY ON FACE  30 g  1  . digoxin (LANOXIN) 0.125 MG tablet TAKE 1 TABLET (0.125 MG TOTAL) BY MOUTH DAILY.  30 tablet  3  . doxepin (SINEQUAN) 25 MG capsule TAKE ONE CAPSULE BY MOUTH TWICE A DAY  180 capsule  1  . furosemide (LASIX) 40 MG tablet TAKE 1 TABLET BY MOUTH TWICE A DAY  180 tablet  0  . hydrocortisone 2.5 % cream APPLY TO AFFECTED AREA TWICE A DAY AS NEEDED (TO FACIAL AREAS  ONLY)  28.35 g  1  . lisinopril (PRINIVIL,ZESTRIL) 5 MG tablet Take 1 tablet (5 mg total) by mouth daily.  30 tablet  12  . metFORMIN (GLUCOPHAGE) 500 MG tablet TAKE 1 TABLET BY MOUTH TWICE A DAY WITH MEALS  60 tablet  7  . methylPREDNISolone (MEDROL DOSEPAK) 4 MG tablet follow package directions  21 tablet  0  . omeprazole (PRILOSEC) 20 MG capsule TAKE ONE CAPSULE BY MOUTH TWICE A DAY  60 capsule  5  . oxyCODONE-acetaminophen (PERCOCET) 7.5-325 MG per tablet Take 1 tablet by mouth every 6 (six) hours as needed for pain. No more than 4 a day  100 tablet  0  . PROAIR HFA 108 (90 BASE) MCG/ACT inhaler INHALE 2 PUFFS INTO THE LUNGS 4 TIMES A DAY  8.5 each  10  . sertraline (ZOLOFT) 100 MG tablet Take 2 tablets (200 mg total) by mouth daily.  180 tablet  3  . tiZANidine (ZANAFLEX) 2 MG tablet TAKE 1 TABLET BY MOUTH 3 TIMES A DAY AS NEEDED FOR MUSCLE SPASMS  60 tablet  2  . traZODone (DESYREL) 100 MG tablet TAKE 1/2 TABLET BY MOUTH AT BEDTIME AS NEEDED  45 tablet  1  . warfarin (COUMADIN) 5 MG tablet TAKE 1 TABLET BY MOUTH DAILY.  120 tablet  0  . zolpidem (AMBIEN) 10 MG tablet TAKE 1 TABLET BY MOUTH AT BEDTIME AS NEEDED FOR SLEEP  30 tablet  5  . [DISCONTINUED] atorvastatin (LIPITOR) 20 MG tablet Take 1 tablet (20 mg total) by mouth daily.  30 tablet  11  . [DISCONTINUED] gabapentin (NEURONTIN) 300 MG capsule Take 1 capsule (300 mg total) by mouth 3 (three) times daily.  90 capsule  5   No current facility-administered medications on file prior to visit.   Review of Systems  Constitutional: Negative for unusual diaphoresis or other sweats  HENT: Negative for ringing in ear Eyes: Negative for double vision or worsening visual disturbance.  Respiratory: Negative for choking and stridor.   Gastrointestinal: Negative for vomiting or other signifcant bowel change Genitourinary: Negative for hematuria or decreased urine volume.  Musculoskeletal: Negative for other MSK pain or swelling Skin: Negative  for color change and worsening wound.  Neurological: Negative for tremors and numbness other than noted  Psychiatric/Behavioral: Negative for decreased concentration or agitation other than above       Objective:   Physical Exam BP 110/60  Pulse 73  Temp(Src) 97.6 F (36.4 C) (Oral)  Wt 325 lb 12 oz (147.759 kg)  SpO2 93% VS noted,  Constitutional: Pt appears well-developed, well-nourished.  HENT: Head:  NCAT.  Right Ear: External ear normal.  Left Ear: External ear normal.  Eyes: . Pupils are equal, round, and reactive to light. Conjunctivae and EOM are normal Neck: Normal range of motion. Neck supple.  Cardiovascular: Normal rate and regular rhythm.   Pulmonary/Chest: Effort normal and breath sounds normal.  - no rales or wheezing Neurological: Pt is alert. Not confused , motor grossly intact Skin: diffuse neck/torso/prox extrem non raised numerous erythem lesions, nontender Psychiatric: Pt behavior is normal. No agitation.     Assessment & Plan:

## 2014-04-20 NOTE — Progress Notes (Signed)
Pre visit review using our clinic review tool, if applicable. No additional management support is needed unless otherwise documented below in the visit note. 

## 2014-04-20 NOTE — Patient Instructions (Signed)
You had the steroid shot today  Please take all new medication as prescribed - the prednisone  You will be contacted regarding the referral for: allergy  Please continue all other medications as before, and refills have been done if requested.  Please have the pharmacy call with any other refills you may need.  Please continue your efforts at being more active, low cholesterol diet, and weight control.  Please keep your appointments with your specialists as you may have planned

## 2014-04-25 DIAGNOSIS — R21 Rash and other nonspecific skin eruption: Secondary | ICD-10-CM | POA: Insufficient documentation

## 2014-04-25 DIAGNOSIS — J309 Allergic rhinitis, unspecified: Secondary | ICD-10-CM | POA: Insufficient documentation

## 2014-04-25 NOTE — Assessment & Plan Note (Signed)
C/w allergic rash, for depomedrol IM, predpac asd

## 2014-04-25 NOTE — Assessment & Plan Note (Signed)
Also for allergy referral, cont same tx

## 2014-04-25 NOTE — Assessment & Plan Note (Signed)
stable overall by history and exam, recent data reviewed with pt, and pt to continue medical treatment as before,  to f/u any worsening symptoms or concerns BP Readings from Last 3 Encounters:  04/20/14 110/60  04/06/14 125/54  03/23/14 136/63

## 2014-04-25 NOTE — Assessment & Plan Note (Signed)
stable overall by history and exam, recent data reviewed with pt, and pt to continue medical treatment as before,  to f/u any worsening symptoms or concerns Lab Results  Component Value Date   HGBA1C 5.6 02/16/2014   Pt to call for onset polys or cbg > 200

## 2014-04-26 ENCOUNTER — Other Ambulatory Visit: Payer: Self-pay | Admitting: Internal Medicine

## 2014-04-27 ENCOUNTER — Telehealth: Payer: Self-pay

## 2014-04-27 NOTE — Telephone Encounter (Signed)
Patient called to see if his next appointment he will get an injection.

## 2014-04-27 NOTE — Telephone Encounter (Signed)
Spoke with patient.  His appointment is for a knee injection.  He would like a thumb injection instead if possible.  Patient will discuss at appointment.

## 2014-04-30 ENCOUNTER — Encounter: Payer: Self-pay | Admitting: Physical Medicine & Rehabilitation

## 2014-04-30 ENCOUNTER — Encounter: Payer: Medicare HMO | Attending: Physical Medicine & Rehabilitation | Admitting: Physical Medicine & Rehabilitation

## 2014-04-30 ENCOUNTER — Telehealth: Payer: Self-pay | Admitting: Pulmonary Disease

## 2014-04-30 VITALS — BP 104/66 | HR 81 | Resp 16 | Ht 70.0 in | Wt 322.0 lb

## 2014-04-30 DIAGNOSIS — M47814 Spondylosis without myelopathy or radiculopathy, thoracic region: Secondary | ICD-10-CM | POA: Insufficient documentation

## 2014-04-30 DIAGNOSIS — IMO0002 Reserved for concepts with insufficient information to code with codable children: Secondary | ICD-10-CM | POA: Diagnosis present

## 2014-04-30 DIAGNOSIS — M1710 Unilateral primary osteoarthritis, unspecified knee: Secondary | ICD-10-CM | POA: Insufficient documentation

## 2014-04-30 DIAGNOSIS — M47817 Spondylosis without myelopathy or radiculopathy, lumbosacral region: Secondary | ICD-10-CM | POA: Diagnosis present

## 2014-04-30 DIAGNOSIS — M1712 Unilateral primary osteoarthritis, left knee: Secondary | ICD-10-CM

## 2014-04-30 DIAGNOSIS — M1711 Unilateral primary osteoarthritis, right knee: Secondary | ICD-10-CM

## 2014-04-30 DIAGNOSIS — I428 Other cardiomyopathies: Secondary | ICD-10-CM | POA: Diagnosis present

## 2014-04-30 DIAGNOSIS — M545 Low back pain, unspecified: Secondary | ICD-10-CM | POA: Insufficient documentation

## 2014-04-30 DIAGNOSIS — M5134 Other intervertebral disc degeneration, thoracic region: Secondary | ICD-10-CM

## 2014-04-30 DIAGNOSIS — M654 Radial styloid tenosynovitis [de Quervain]: Secondary | ICD-10-CM | POA: Insufficient documentation

## 2014-04-30 MED ORDER — OXYCODONE-ACETAMINOPHEN 7.5-325 MG PO TABS: 1.0000 | ORAL_TABLET | Freq: Four times a day (QID) | ORAL | Status: DC | PRN

## 2014-04-30 NOTE — Progress Notes (Signed)
Subjective:    Patient ID: [name], male    DOB: 27-Nov-1955, 58 y.o.   MRN: [id number]  HPI  Here for injections to thumb, right knee which have been worsening despite conservative mgt.   Pain Inventory Average Pain 8 Pain Right Now 8 My pain is sharp, dull, stabbing and aching  In the last 24 hours, has pain interfered with the following? General activity 6 Relation with others 6 Enjoyment of life 4 What TIME of day is your pain at its worst? night Sleep (in general) Fair  Pain is worse with: walking, bending, sitting, standing and some activites Pain improves with: rest, medication and injections Relief from Meds: 6  Mobility use a cane how many minutes can you walk? 10 ability to climb steps?  yes do you drive?  yes Do you have any goals in this area?  yes  Function disabled: date disabled 2009 Do you have any goals in this area?  yes  Neuro/Psych tremor trouble walking spasms  Prior Studies Any changes since last visit?  no  Physicians involved in your care Dr Edmonia Lynch   Family History  Problem Relation Age of Onset  . COPD Mother   . Asthma Mother   . Colon polyps Mother   . Allergies Mother   . Asthma Maternal Grandmother    History   Social History  . Marital Status: Married    Spouse Name: N/A    Number of Children: 2  . Years of Education: N/A   Occupational History  . retired/disabled. prev worked in Therapist, sports.    Social History Main Topics  . Smoking status: Former Smoker -- 2.00 packs/day for 30 years    Types: Cigarettes    Quit date: 10/16/2007  . Smokeless tobacco: Never Used  . Alcohol Use: No  . Drug Use: No  . Sexual Activity: None   Other Topics Concern  . None   Social History Narrative   Lives alone.   Past Surgical History  Procedure Laterality Date  . Tonsillectomy    . Acne cyst removal      2 on back   . Cardioversion  04/18/2012    Procedure: CARDIOVERSION;  Surgeon: Fay Records, MD;   Location: [place];  Service: Cardiovascular;  Laterality: N/A;  . Tee without cardioversion  04/25/2012    Procedure: TRANSESOPHAGEAL ECHOCARDIOGRAM (TEE);  Surgeon: Thayer Headings, MD;  Location: [place];  Service: Cardiovascular;  Laterality: N/A;  . Cardioversion  04/25/2012    Procedure: CARDIOVERSION;  Surgeon: Thayer Headings, MD;  Location: [facility];  Service: Cardiovascular;  Laterality: N/A;  . Cardioversion  04/25/2012    Procedure: CARDIOVERSION;  Surgeon: Fay Records, MD;  Location: [place];  Service: Cardiovascular;  Laterality: N/A;  . Cardioversion  05/09/2012    Procedure: CARDIOVERSION;  Surgeon: Sherren Mocha, MD;  Location: [facility] OR;  Service: Cardiovascular;  Laterality: N/A;  changed from crenshaw to cooper by trish/leone-endo   Past Medical History  Diagnosis Date  . ALLERGIC RHINITIS   . ANXIETY   . ASTHMA   . Chronic pain syndrome   . BENIGN PROSTATIC HYPERTROPHY   . DEPRESSION   . DIABETES MELLITUS, TYPE II   . DYSKINESIA, ESOPHAGUS   . FIBROMYALGIA   . GERD   . HYPERLIPIDEMIA   . HYPERTENSION   . INSOMNIA   . LOW BACK PAIN   . Morbid obesity     Anticipated LAP-BAND surgery  . NEPHROLITHIASIS,  HX OF   . OBSESSIVE-COMPULSIVE DISORDER   . ORCHITIS   . PERIPHERAL EDEMA   . PLANTAR FASCIITIS, BILATERAL   . POLYARTHRALGIA   . Rash and other nonspecific skin eruption 04/12/2009  . SLEEP APNEA, OBSTRUCTIVE   . Atrial fibrillation     Initially identified 2001, recurring 2013  . Cardiomyopathy     Myoview with prior infarct- 2012; EF 25% TEE July 2013  . Syncope     Associated with atrial fibrillation 2001  . H/O hiatal hernia   . Myocardial infarction     hx of MI   BP 104/66  Pulse 81  Resp 16  Ht 5\' 10"  (1.778 m)  Wt 322 lb (146.058 kg)  BMI 46.20 kg/m2  SpO2 94%  Opioid Risk Score:   Fall Risk Score: Low Fall Risk (0-5 points) (patient educated handout declined)   Review of Systems  Musculoskeletal: Positive for gait problem.    Neurological: Positive for tremors.       Spasm  All other systems reviewed and are negative.      Objective:   Physical Exam  Pain along EPB/APL. Finkelstein's test positive. Right knee with crepitus and effusion. Antalgia on right      Assessment & Plan:  1. OA right knee: After informed consent and preparation of the skin with betadine and isopropyl alcohol, I injected 6mg  (1cc) of celestone and 4cc of 1% lidocaine into the right knee via anterolateral approach. Additionally, aspiration was performed prior to injection. The patient tolerated well, and no complications were encountered. Afterward the area was cleaned and dressed. Post- injection instructions were provided.   2. Dequervain's tenosynovitis: After informed consent and preparation of the skin with betadine and isopropyl alcohol, I injected 6mg  (1cc) of celestone and 2cc of 1% lidocaine around the APL/EPB via anterior approach. Additionally, aspiration was performed prior to injection. The patient tolerated well, and no complications were encountered. Afterward the area was cleaned and dressed. Post- injection instructions were provided.   Discusses splinting, ice, relative rest, preservation of ROM.  See back in 6 weeks. Percocet was refilled today

## 2014-04-30 NOTE — Telephone Encounter (Signed)
I received a fax requesting surgical clearance for this pt for right total knee replacement. Pt last OV was on 01-26-14 with no follow-up scheduled. Form placed in your green folder for review. Please let me know if I need to schedule the pt for an appt for this. Thanks. Morton Bing, CMA

## 2014-04-30 NOTE — Patient Instructions (Signed)
PLEASE CALL ME WITH ANY PROBLEMS OR QUESTIONS (#[phone]).      

## 2014-05-04 ENCOUNTER — Other Ambulatory Visit: Payer: Self-pay | Admitting: Internal Medicine

## 2014-05-05 NOTE — Telephone Encounter (Signed)
Form completed by [facility] and faxed to Ortho at [phone]. Copy placed in scan folder. Aurora Bing, CMA

## 2014-05-06 ENCOUNTER — Telehealth: Payer: Self-pay

## 2014-05-06 MED ORDER — PREDNISONE 10 MG PO TABS: ORAL_TABLET | ORAL | Status: DC

## 2014-05-06 NOTE — Telephone Encounter (Signed)
Patient informed had to leave a detailed message script sent in.

## 2014-05-06 NOTE — Telephone Encounter (Signed)
Done erx 

## 2014-05-06 NOTE — Telephone Encounter (Signed)
The patient has appointment with his allergy MD on June 16, 2014.  Would like refill on his prednisone due to the rash on his head and neck?  Advise please.  He stated is needed he could come in, but if ok to just send one more fill on prednisone.

## 2014-05-07 ENCOUNTER — Other Ambulatory Visit: Payer: Self-pay | Admitting: Internal Medicine

## 2014-05-11 ENCOUNTER — Ambulatory Visit (INDEPENDENT_AMBULATORY_CARE_PROVIDER_SITE_OTHER): Payer: Commercial Managed Care - HMO | Admitting: General Practice

## 2014-05-11 DIAGNOSIS — Z5181 Encounter for therapeutic drug level monitoring: Secondary | ICD-10-CM

## 2014-05-11 LAB — POCT INR: INR: 2.4

## 2014-05-11 NOTE — Progress Notes (Signed)
Pre visit review using our clinic review tool, if applicable. No additional management support is needed unless otherwise documented below in the visit note. 

## 2014-05-16 ENCOUNTER — Other Ambulatory Visit: Payer: Self-pay | Admitting: Internal Medicine

## 2014-05-18 ENCOUNTER — Other Ambulatory Visit: Payer: Self-pay

## 2014-05-18 MED ORDER — ALPRAZOLAM 0.5 MG PO TABS: ORAL_TABLET | ORAL | Status: DC

## 2014-05-18 NOTE — Telephone Encounter (Signed)
Done hardcopy to robin  

## 2014-05-18 NOTE — Telephone Encounter (Signed)
Faxed hardcopy to [facility], patient informed.

## 2014-05-19 ENCOUNTER — Other Ambulatory Visit: Payer: Self-pay | Admitting: Internal Medicine

## 2014-05-19 NOTE — Telephone Encounter (Signed)
Done hardcopy to robin  

## 2014-05-20 NOTE — Telephone Encounter (Signed)
Faxed hardcopy to [facility]

## 2014-05-21 ENCOUNTER — Other Ambulatory Visit: Payer: Self-pay | Admitting: Internal Medicine

## 2014-05-21 ENCOUNTER — Other Ambulatory Visit: Payer: Self-pay

## 2014-05-21 ENCOUNTER — Telehealth: Payer: Self-pay

## 2014-05-21 MED ORDER — PREDNISONE 20 MG PO TABS: 20.0000 mg | ORAL_TABLET | Freq: Every day | ORAL | Status: DC

## 2014-05-21 NOTE — Telephone Encounter (Signed)
Patient informed. 

## 2014-05-21 NOTE — Telephone Encounter (Signed)
The patient still is having problems with the rash on his neck.  He did try to schedule today with PCP but no openings and will not get in to allergist until September.  He would like a refill on the prednisone??

## 2014-05-21 NOTE — Telephone Encounter (Signed)
Ok for short course trial prednisone

## 2014-05-21 NOTE — Telephone Encounter (Signed)
Faxed trazodone hardcopy to [facility].

## 2014-05-22 ENCOUNTER — Encounter: Payer: Self-pay | Admitting: Family Medicine

## 2014-05-22 ENCOUNTER — Ambulatory Visit (INDEPENDENT_AMBULATORY_CARE_PROVIDER_SITE_OTHER): Payer: Commercial Managed Care - HMO | Admitting: Family Medicine

## 2014-05-22 VITALS — BP 138/80 | Temp 98.7°F | Wt 328.0 lb

## 2014-05-22 DIAGNOSIS — L259 Unspecified contact dermatitis, unspecified cause: Secondary | ICD-10-CM

## 2014-05-22 DIAGNOSIS — L309 Dermatitis, unspecified: Secondary | ICD-10-CM

## 2014-05-22 MED ORDER — METHYLPREDNISOLONE ACETATE 80 MG/ML IJ SUSP
80.0000 mg | Freq: Once | INTRAMUSCULAR | Status: AC
  Administered 2014-05-22: 80 mg via INTRAMUSCULAR

## 2014-05-22 NOTE — Patient Instructions (Signed)
Buy OTC generic allegra 180mg  and take one per day for itching.

## 2014-05-22 NOTE — Progress Notes (Signed)
OFFICE NOTE  05/22/2014  CC:  Chief Complaint  Patient presents with  . Rash    on neck   HPI: Patient is a 58 y.o. Caucasian male who is here for rash.   Seems to have started a couple months ago and itches.  Steroid injections and oral steroid treatments in the past have helped but it keeps returning. Dr. [name] called in pred 20mg  qd x 5d yesterday.  Pt is hoping to get an injection of steroid today.  Benadryl gives him anxiety.    Pertinent PMH:  PMH and PSH reviewed.  MEDS:  Outpatient Prescriptions Prior to Visit  Medication Sig Dispense Refill  . ADVAIR DISKUS 250-50 MCG/DOSE AEPB TAKE 1 PUFF BY MOUTH DAILY AS DIRECTED  60 each  10  . ALPRAZolam (XANAX) 0.5 MG tablet TAKE 1 TABLET BY MOUTH TWICE A DAY AS NEEDED FOR ANXIETY  60 tablet  2  . amiodarone (PACERONE) 100 MG tablet Take 1 tablet (100 mg total) by mouth daily.  30 tablet  6  . carisoprodol (SOMA) 350 MG tablet TAKE 1 TABLET BY MOUTH 3 TIMES A DAY AS NEEDED  90 tablet  2  . clobetasol cream (TEMOVATE) 0.05 % APPLY TO AFFECTED AREA TWICE A DAY BUT DO NOT APPLY ON FACE  30 g  1  . digoxin (LANOXIN) 0.125 MG tablet TAKE 1 TABLET (0.125 MG TOTAL) BY MOUTH DAILY.  30 tablet  3  . doxepin (SINEQUAN) 25 MG capsule TAKE ONE CAPSULE BY MOUTH TWICE A DAY  180 capsule  1  . furosemide (LASIX) 40 MG tablet TAKE 1 TABLET BY MOUTH TWICE A DAY  180 tablet  0  . hydrocortisone 2.5 % cream APPLY TO AFFECTED AREA TWICE A DAY AS NEEDED (TO FACIAL AREAS ONLY)  28.35 g  1  . lisinopril (PRINIVIL,ZESTRIL) 5 MG tablet Take 1 tablet (5 mg total) by mouth daily.  30 tablet  12  . metFORMIN (GLUCOPHAGE) 500 MG tablet TAKE 1 TABLET BY MOUTH TWICE A DAY WITH MEALS  60 tablet  7  . methylPREDNISolone (MEDROL DOSEPAK) 4 MG tablet follow package directions  21 tablet  0  . omeprazole (PRILOSEC) 20 MG capsule TAKE ONE CAPSULE BY MOUTH TWICE A DAY  180 capsule  2  . oxyCODONE-acetaminophen (PERCOCET) 7.5-325 MG per tablet Take 1 tablet by mouth every 6  (six) hours as needed for pain. No more than 4 a day  100 tablet  0  . predniSONE (DELTASONE) 20 MG tablet Take 1 tablet (20 mg total) by mouth daily with breakfast.  5 tablet  0  . PROAIR HFA 108 (90 BASE) MCG/ACT inhaler INHALE 2 PUFFS INTO THE LUNGS 4 TIMES A DAY  8.5 each  10  . sertraline (ZOLOFT) 100 MG tablet Take 2 tablets (200 mg total) by mouth daily.  180 tablet  3  . tiZANidine (ZANAFLEX) 2 MG tablet TAKE 1 TABLET BY MOUTH 3 TIMES A DAY AS NEEDED FOR MUSCLE SPASMS  60 tablet  2  . traZODone (DESYREL) 100 MG tablet TAKE 1/2 TABLET BY MOUTH AT BEDTIME AS NEEDED  45 tablet  1  . warfarin (COUMADIN) 5 MG tablet TAKE 1 TABLET BY MOUTH DAILY.  120 tablet  0  . zolpidem (AMBIEN) 10 MG tablet TAKE 1 TABLET BY MOUTH AT BEDTIME AS NEEDED FOR SLEEP  30 tablet  5   No facility-administered medications prior to visit.    PE: Blood pressure 138/80, temperature 98.7 F (37.1 C), temperature  source Oral, weight 328 lb (148.78 kg). Gen: Alert, well appearing.  Patient is oriented to person, place, time, and situation. Skin: around his neck he has a slightly "gooseflesh" appearance to his skin, with mild tanned appearance to the area and about 6-8 pinkish papules, a few are excoriated.  No vesicles.    IMPRESSION AND PLAN: Dermatitis, nonspecific.  Unknown etiology. Will give depo medrol 80mg  IM today.  Continue with prev rx'd prednisone starting tomorrow.  Take allegra 180mg  qd. Has allergist consult pending--sept 15th.  An After Visit Summary was printed and given to the patient.  FOLLOW UP: prn

## 2014-05-22 NOTE — Progress Notes (Signed)
Pre visit review using our clinic review tool, if applicable. No additional management support is needed unless otherwise documented below in the visit note. 

## 2014-05-25 ENCOUNTER — Other Ambulatory Visit: Payer: Self-pay | Admitting: Internal Medicine

## 2014-05-25 ENCOUNTER — Telehealth: Payer: Self-pay | Admitting: Pulmonary Disease

## 2014-05-25 NOTE — Telephone Encounter (Signed)
Called spoke with patient who reported that he is having some difficulties with [facility] and billing thru [place].  He is being charged an additional $300 and was told that there is a missing code.  Pt stated he was instructed to call this office and ask [place] to speak with [employer].  Invoice # [id number].  Acct #: [id number].  Called [facility] and was directed to their billing dept at [phone], pt's Customer ID #: [id number].  Once transferred to billing, automated message stated there would be a 30-45minute wait.  WCB this afternoon.

## 2014-05-26 NOTE — Telephone Encounter (Signed)
Done hardcopy to robin  

## 2014-05-26 NOTE — Telephone Encounter (Signed)
Faxed hardcopy to [facility]

## 2014-05-26 NOTE — H&P (Signed)
  [facility] [address] [place], [place] [address] [phone] A Division of [facility]  Ninetta Lights, M.D.   [name] A. Noemi Chapel, M.D.   Faythe Casa, M.D.   Johnny Bridge, M.D.   Almedia Balls, M.D. Ernesta Amble. Percell Miller, M.D.  Joseph Pierini, M.D.  Lanier Prude, M.D.    Verner Chol, M.D. Mary L. Fenton Malling, PA-C  Kirstin A. Shepperson, PA-C  Josh Lamy, PA-C [place], [place]    RE: Achilles, [name]   [id number]      DOB: 12-Jan-1956 PROGRESS NOTE: 04-23-14 Reason for visit:   Bilateral knee pain.    History of present illness:  58 year old gentleman with bilateral gradual onset of atraumatic severe knee pain.  It affects mostly his medial side.  He has tried Synvisc, cortisone injections, NSAIDs and bracing.  None of these provide relief any more.  He is interested in arthroplasty.      Please see associated documentation for this clinic visit for further past medical, family, surgical and social history, review of systems, and exam findings as this was reviewed by me.  EXAMINATION: Well appearing male no apparent distress.  Bilateral lower extremities are neurovascularly intact.  Primarily medial compartment pain but he does have some lateral pain, correctable varus.  He has good extension.    IMAGES: X-rays reviewed by me:   4 views of the bilateral knees demonstrate severe arthritis mostly affecting the medial compartment.  We did two stress views and his lateral compartment did close down some on these x-rays.    ASSESSMENT & PLAN: Bilateral knee severe arthritis.  I stressed to him that I do not think he is a uni candidate.  I discussed that he could continue on with conservative measures.  He does not wish to do this.  We discussed the risks and benefits of knee arthroplasty and he would like to go forward with a right total knee arthroplasty.     Ernesta Amble.  Percell Miller, M.D.  Electronically  verified by Ernesta Amble. Percell Miller, M.D. TDM:gde D 04-23-14 T 04-23-14 Cc:  [name], MD fax [phone]

## 2014-05-26 NOTE — Telephone Encounter (Signed)
Called # provided [phone].  Spoke with Colletta Maryland from [facility] billing. With that invoice # they are only showing were they bill him for $21.30. They do show insurance has a pending balance of $300.00 but this is not something he owe's. They would not charge him this either.  She also do not see anything about a missing code.  If pt has any questions, then he could call them.  I called made pt aware. He voiced his understanding. Nothing further needed

## 2014-05-31 ENCOUNTER — Other Ambulatory Visit: Payer: Self-pay | Admitting: Physical Medicine & Rehabilitation

## 2014-06-04 ENCOUNTER — Encounter (HOSPITAL_COMMUNITY): Payer: Self-pay | Admitting: Pharmacy Technician

## 2014-06-04 ENCOUNTER — Telehealth: Payer: Self-pay | Admitting: Internal Medicine

## 2014-06-04 NOTE — Telephone Encounter (Signed)
New problem   Pt having surgery 06/15/14 and need to know when to come off of his coumadin. Please advise pt.

## 2014-06-04 NOTE — Telephone Encounter (Signed)
Told patient that I have not received clearance request from his physician. Patient agree to call their office to request them to fax me clearance request paperwork.

## 2014-06-07 ENCOUNTER — Encounter (HOSPITAL_COMMUNITY): Payer: Self-pay

## 2014-06-07 ENCOUNTER — Encounter (HOSPITAL_COMMUNITY)
Admission: RE | Admit: 2014-06-07 | Discharge: 2014-06-07 | Disposition: A | Discharge status: Home / Self Care | Payer: Medicare HMO | Source: Ambulatory Visit | Attending: Orthopedic Surgery | Admitting: Orthopedic Surgery

## 2014-06-07 DIAGNOSIS — M1710 Unilateral primary osteoarthritis, unspecified knee: Secondary | ICD-10-CM | POA: Insufficient documentation

## 2014-06-07 DIAGNOSIS — Z01818 Encounter for other preprocedural examination: Secondary | ICD-10-CM | POA: Insufficient documentation

## 2014-06-07 HISTORY — DX: Unspecified osteoarthritis, unspecified site: M19.90

## 2014-06-07 HISTORY — DX: Polyneuropathy, unspecified: G62.9

## 2014-06-07 HISTORY — DX: Other constipation: K59.09

## 2014-06-07 HISTORY — DX: Heart failure, unspecified: I50.9

## 2014-06-07 HISTORY — DX: Anemia, unspecified: D64.9

## 2014-06-07 HISTORY — DX: Unspecified glaucoma: H40.9

## 2014-06-07 HISTORY — DX: Diverticulitis of intestine, part unspecified, without perforation or abscess without bleeding: K57.92

## 2014-06-07 HISTORY — DX: Dependence on supplemental oxygen: Z99.81

## 2014-06-07 HISTORY — DX: Chronic obstructive pulmonary disease, unspecified: J44.9

## 2014-06-07 LAB — CBC
HCT: 29.5 % — ABNORMAL LOW (ref 39.0–52.0)
HEMOGLOBIN: 9.4 g/dL — AB (ref 13.0–17.0)
MCH: 25.4 pg — ABNORMAL LOW (ref 26.0–34.0)
MCHC: 31.9 g/dL (ref 30.0–36.0)
MCV: 79.7 fL (ref 78.0–100.0)
Platelets: 324 10*3/uL (ref 150–400)
RBC: 3.7 MIL/uL — AB (ref 4.22–5.81)
RDW: 14.3 % (ref 11.5–15.5)
WBC: 9.4 10*3/uL (ref 4.0–10.5)

## 2014-06-07 LAB — PROTIME-INR
INR: 1.78 — AB (ref 0.00–1.49)
PROTHROMBIN TIME: 20.7 s — AB (ref 11.6–15.2)

## 2014-06-07 LAB — BASIC METABOLIC PANEL
Anion gap: 13 (ref 5–15)
BUN: 19 mg/dL (ref 6–23)
CHLORIDE: 98 meq/L (ref 96–112)
CO2: 24 mEq/L (ref 19–32)
Calcium: 8.9 mg/dL (ref 8.4–10.5)
Creatinine, Ser: 0.94 mg/dL (ref 0.50–1.35)
GFR calc non Af Amer: 90 mL/min — ABNORMAL LOW (ref >90)
Glucose, Bld: 87 mg/dL (ref 70–99)
POTASSIUM: 4.5 meq/L (ref 3.7–5.3)
Sodium: 135 mEq/L — ABNORMAL LOW (ref 137–147)

## 2014-06-07 LAB — URINALYSIS, ROUTINE W REFLEX MICROSCOPIC
BILIRUBIN URINE: NEGATIVE
Glucose, UA: NEGATIVE mg/dL
HGB URINE DIPSTICK: NEGATIVE
KETONES UR: NEGATIVE mg/dL
Leukocytes, UA: NEGATIVE
Nitrite: NEGATIVE
PH: 6 (ref 5.0–8.0)
Protein, ur: NEGATIVE mg/dL
Specific Gravity, Urine: 1.02 (ref 1.005–1.030)
Urobilinogen, UA: 0.2 mg/dL (ref 0.0–1.0)

## 2014-06-07 LAB — TYPE AND SCREEN
ABO/RH(D): A POS
Antibody Screen: NEGATIVE

## 2014-06-07 LAB — ABO/RH: ABO/RH(D): A POS

## 2014-06-07 LAB — SURGICAL PCR SCREEN
MRSA, PCR: NEGATIVE
STAPHYLOCOCCUS AUREUS: POSITIVE — AB

## 2014-06-07 MED ORDER — CHLORHEXIDINE GLUCONATE 4 % EX LIQD: 60.0000 mL | Freq: Once | CUTANEOUS | Status: DC

## 2014-06-07 NOTE — Progress Notes (Signed)
Anesthesia Chart Review:  Pt is 58 year old male posted for R total knee arthroplasty on 06/15/14 with Dr. Alain Marion.   PMH includes: cardiomyopathy, MI, atrial fibrillation, HTN, CHF, COPD, asthma, OSA, Hyperlipidemia, diabetes, anemia, GERD, glaucoma. Pt is O2 dependent.   Preoperative labs reviewed. Claiborne Billings with Dr. Debroah Loop office notified of hgb level.   Chest x-ray 01/26/14 reviewed. No active cardiopulmonary disease. The heart is mildly enlarged. There is bibasilar, right middle lobe, and lingular atelectasis or scarring. No focal consolidations or pleural effusions are identified. Right pleural thickening is noted. Probable old right rib fractures are also present. Degenerative changes are seen in the thoracic spine.   EKG 03/23/14: NSR, RBBB.   Stress test 04/08/14:  -Low risk stress nuclear study with fixed inferior bowel attenuation artifact.  -LV Ejection Fraction: 57%. LV Wall Motion: NL LV Function; NL Wall Motion  Per Claiborne Billings in Dr. Debroah Loop office, pt knows to stop coumadin 5 days prior to surgery.   PT/PTT to be drawn DOS.   Clearance for surgery from Dr. [name] (cardiology) with permission to hold coumadin 5 days prior to surgery.  Clearance for surgery from Dr. Gwenette Greet (pulmonology) with moderate risk. Clearance for surgery from Dr. [name] (medical).  f  If no changes, I anticipate pt can proceed with surgery as scheduled.   Willeen Cass, FNP-BC [facility]/Anesthesiology Phone: [phone] 06/07/2014 4:03 PM

## 2014-06-07 NOTE — Progress Notes (Signed)
Mupirocin script called into [facility] on [facility]

## 2014-06-07 NOTE — Progress Notes (Signed)
Cardiologist is [place] visit in June 2015  Echo reports in epic from 2012/2013/2014  Stress test reports in epic from 2012/2015  Sleep study in epic from 2013  EKG in epic from 03-23-14  CXR in epic from 01-26-14  Medical Md is Dr.James [name]

## 2014-06-07 NOTE — Pre-Procedure Instructions (Signed)
[name]  06/07/2014   Your procedure is scheduled on:  Tues, Sept 1 @ 1:35 PM  Report to [facility]  at 11:30 AM.  Call this number if you have problems the morning of surgery: [phone]   Remember:   Do not eat food or drink liquids after midnight.   Take these medicines the morning of surgery with A SIP OF WATER: Albuterol<Bring Your Inhaler With You>,Amiodarone(Pacerone),Digoxin(Lanoxin),Doxepin(Sinequan),Advair,Omeprazole(Prilosec),Pain Pill(if needed),Prednisone(Deltasone),and Zoloft(Sertraline)                 Stop taking your Coumadin as you have been instructed,also stop taking your Ibuprofen. No Goody's,BC's,Aspirin,Fish Oil,or any Herbal Medications                Do not wear jewelry  Do not wear lotions, powders, or colognes. You may wear deodorant.  Men may shave face and neck.  Do not bring valuables to the hospital.  [facility] is not responsible                  for any belongings or valuables.               Contacts, dentures or bridgework may not be worn into surgery.  Leave suitcase in the car. After surgery it may be brought to your room.  For patients admitted to the hospital, discharge time is determined by your                treatment team.                 Special Instructions:  [place] - Preparing for Surgery  Before surgery, you can play an important role.  Because skin is not sterile, your skin needs to be as free of germs as possible.  You can reduce the number of germs on you skin by washing with CHG (chlorahexidine gluconate) soap before surgery.  CHG is an antiseptic cleaner which kills germs and bonds with the skin to continue killing germs even after washing.  Please DO NOT use if you have an allergy to CHG or antibacterial soaps.  If your skin becomes reddened/irritated stop using the CHG and inform your nurse when you arrive at Short Stay.  Do not shave (including legs and underarms) for at least 48 hours prior to the first  CHG shower.  You may shave your face.  Please follow these instructions carefully:   1.  Shower with CHG Soap the night before surgery and the                                morning of Surgery.  2.  If you choose to wash your hair, wash your hair first as usual with your       normal shampoo.  3.  After you shampoo, rinse your hair and body thoroughly to remove the                      Shampoo.  4.  Use CHG as you would any other liquid soap.  You can apply chg directly       to the skin and wash gently with scrungie or a clean washcloth.  5.  Apply the CHG Soap to your body ONLY FROM THE NECK DOWN.        Do not use on open wounds or open sores.  Avoid contact with your eyes,  ears, mouth and genitals (private parts).  Wash genitals (private parts)       with your normal soap.  6.  Wash thoroughly, paying special attention to the area where your surgery        will be performed.  7.  Thoroughly rinse your body with warm water from the neck down.  8.  DO NOT shower/wash with your normal soap after using and rinsing off       the CHG Soap.  9.  Pat yourself dry with a clean towel.            10.  Wear clean pajamas.            11.  Place clean sheets on your bed the night of your first shower and do not        sleep with pets.  Day of Surgery  Do not apply any lotions/deoderants the morning of surgery.  Please wear clean clothes to the hospital/surgery center.     Please read over the following fact sheets that you were given: Pain Booklet, Coughing and Deep Breathing, Blood Transfusion Information, MRSA Information and Surgical Site Infection Prevention

## 2014-06-08 ENCOUNTER — Encounter: Payer: Self-pay | Admitting: Internal Medicine

## 2014-06-08 ENCOUNTER — Ambulatory Visit (INDEPENDENT_AMBULATORY_CARE_PROVIDER_SITE_OTHER): Payer: Commercial Managed Care - HMO | Admitting: *Deleted

## 2014-06-08 ENCOUNTER — Telehealth: Payer: Self-pay

## 2014-06-08 ENCOUNTER — Ambulatory Visit (INDEPENDENT_AMBULATORY_CARE_PROVIDER_SITE_OTHER): Payer: Commercial Managed Care - HMO | Admitting: Internal Medicine

## 2014-06-08 VITALS — BP 122/72 | HR 80 | Temp 98.2°F | Wt 328.0 lb

## 2014-06-08 DIAGNOSIS — I4891 Unspecified atrial fibrillation: Secondary | ICD-10-CM

## 2014-06-08 DIAGNOSIS — E119 Type 2 diabetes mellitus without complications: Secondary | ICD-10-CM

## 2014-06-08 DIAGNOSIS — Z5181 Encounter for therapeutic drug level monitoring: Secondary | ICD-10-CM

## 2014-06-08 DIAGNOSIS — R21 Rash and other nonspecific skin eruption: Secondary | ICD-10-CM

## 2014-06-08 DIAGNOSIS — I10 Essential (primary) hypertension: Secondary | ICD-10-CM

## 2014-06-08 LAB — URINE CULTURE
CULTURE: NO GROWTH
Colony Count: NO GROWTH

## 2014-06-08 LAB — POCT INR: INR: 1.8

## 2014-06-08 MED ORDER — HYDROXYZINE HCL 25 MG PO TABS: 25.0000 mg | ORAL_TABLET | Freq: Three times a day (TID) | ORAL | Status: DC | PRN

## 2014-06-08 MED ORDER — METHYLPREDNISOLONE ACETATE 80 MG/ML IJ SUSP
80.0000 mg | Freq: Once | INTRAMUSCULAR | Status: AC
  Administered 2014-06-08: 80 mg via INTRAMUSCULAR

## 2014-06-08 MED ORDER — PREDNISONE 10 MG PO TABS: ORAL_TABLET | ORAL | Status: DC

## 2014-06-08 NOTE — Telephone Encounter (Signed)
[place] for OV today workin or next available

## 2014-06-08 NOTE — Telephone Encounter (Signed)
The patient called stating he continues to have the problem with itching.  He would like to know if he can get another refill of the prednisone or OV

## 2014-06-08 NOTE — Progress Notes (Signed)
Subjective:    Patient ID: [name], male    DOB: 11/03/1955, 58 y.o.   MRN: [id number]  HPI  Here to f/u, has intermittent erythem rash now to face, neck and extremities, itching terrible, similar to prior rash better with prednisone.  Cant take benadryl per pt due to making him hyper.  Pt denies chest pain, increased sob or doe, wheezing, orthopnea, PND, increased LE or other swelling, palpitations, dizziness or syncope.  Pt denies new neurological symptoms such as new headache, or facial or extremity weakness or numbness   Pt denies polydipsia, polyuria.  Has right knee TKR for next Mon Aug 31, eager to improve before then.  Next allegy appt sept 2015 Past Medical History  Diagnosis Date  . Chronic pain syndrome   . DYSKINESIA, ESOPHAGUS   . FIBROMYALGIA   . HYPERLIPIDEMIA     but unable to take any meds  . INSOMNIA     takes Ambien nightly  . LOW BACK PAIN     DDD and Herniated disc  . ORCHITIS   . Rash and other nonspecific skin eruption 04/12/2009  . Atrial fibrillation     takes Coumadin daily along with Pacerone  . Cardiomyopathy     Myoview with prior infarct- 2012; EF 25% TEE July 2013  . H/O hiatal hernia   . Myocardial infarction     hx of MI  . ANXIETY     takes Xanax nightly  . Muscle spasm     takes Zanaflex daily as needed  . ASTHMA     Albuterol prn and Advair daily;also takes Prednisone daily  . GERD     takes Omeprazole daily  . DIABETES MELLITUS, TYPE II     takes Metformin daily  . HYPERTENSION     takes Lisinopril daily  . Peripheral edema     takes Lasix daily  . CHF (congestive heart failure)     takes Furosemide daily  . COPD (chronic obstructive pulmonary disease)   . O2 dependent     2l/m via n/c  . Shortness of breath     lying,sitting,exertion  . SLEEP APNEA, OBSTRUCTIVE     doesn't use CPAP  . Peripheral neuropathy   . Fibromyalgia   . Arthritis   . Joint pain   . Joint swelling   . Chronic constipation     takes OTC stool  softener  . History of colon polyps   . Diverticulitis   . Urinary frequency   . Urinary urgency   . Nocturia   . History of kidney stones   . Anemia     supposed to be taking Vit B but doesn't  . Glaucoma   . DEPRESSION     takes Zoloft and Doxepin daily   Past Surgical History  Procedure Laterality Date  . Tonsillectomy    . Acne cyst removal      2 on back   . Cardioversion  04/18/2012    Procedure: CARDIOVERSION;  Surgeon: Fay Records, MD;  Location: [facility];  Service: Cardiovascular;  Laterality: N/A;  . Tee without cardioversion  04/25/2012    Procedure: TRANSESOPHAGEAL ECHOCARDIOGRAM (TEE);  Surgeon: Thayer Headings, MD;  Location: [facility];  Service: Cardiovascular;  Laterality: N/A;  . Cardioversion  04/25/2012    Procedure: CARDIOVERSION;  Surgeon: Thayer Headings, MD;  Location: [facility];  Service: Cardiovascular;  Laterality: N/A;  . Cardioversion  04/25/2012    Procedure: CARDIOVERSION;  Surgeon: Carmin Muskrat  Harrington Challenger, MD;  Location: [facility];  Service: Cardiovascular;  Laterality: N/A;  . Cardioversion  05/09/2012    Procedure: CARDIOVERSION;  Surgeon: Sherren Mocha, MD;  Location: [place];  Service: Cardiovascular;  Laterality: N/A;  changed from crenshaw to cooper by trish/leone-endo  . Colonoscopy    . Esophagogastroduodenoscopy      reports that he quit smoking about 6 years ago. His smoking use included Cigarettes. He has a 60 pack-year smoking history. He has never used smokeless tobacco. He reports that he does not drink alcohol or use illicit drugs. family history includes Allergies in his mother; Asthma in his maternal grandmother and mother; COPD in his mother; Colon polyps in his mother. Allergies  Allergen Reactions  . Simvastatin     REACTION: severe leg cramps  . Statins Other (See Comments)    myalgia   Current Outpatient Prescriptions on File Prior to Visit  Medication Sig Dispense Refill  . albuterol (PROVENTIL HFA;VENTOLIN HFA) 108 (90 BASE) MCG/ACT  inhaler Inhale 2 puffs into the lungs 4 (four) times daily.      [name] ALPRAZolam (XANAX) 0.5 MG tablet Take 1 mg by mouth at bedtime.      [name] amiodarone (PACERONE) 100 MG tablet Take 1 tablet (100 mg total) by mouth daily.  30 tablet  6  . carisoprodol (SOMA) 350 MG tablet Take 1,050 mg by mouth at bedtime.      . clobetasol cream (TEMOVATE) 7.48 % Apply 1 application topically 2 (two) times daily.      . digoxin (LANOXIN) 0.125 MG tablet Take 0.125 mg by mouth daily.      [name] doxepin (SINEQUAN) 25 MG capsule Take 25 mg by mouth 2 (two) times daily.      . Fluticasone-Salmeterol (ADVAIR) 250-50 MCG/DOSE AEPB Inhale 1-2 puffs into the lungs daily.      . furosemide (LASIX) 40 MG tablet Take 40 mg by mouth 2 (two) times daily.      . hydrocortisone 2.5 % cream Apply 1 application topically 2 (two) times daily as needed (inflammation).      [name] ibuprofen (ADVIL,MOTRIN) 200 MG tablet Take 800 mg by mouth every 6 (six) hours as needed (pain).      . metFORMIN (GLUCOPHAGE) 500 MG tablet Take 500 mg by mouth 2 (two) times daily with a meal.      . omeprazole (PRILOSEC) 20 MG capsule Take 20 mg by mouth 2 (two) times daily before a meal.      . oxyCODONE-acetaminophen (PERCOCET) 7.5-325 MG per tablet Take 1 tablet by mouth every 6 (six) hours as needed for pain. No more than 4 a day  100 tablet  0  . sertraline (ZOLOFT) 100 MG tablet Take 2 tablets (200 mg total) by mouth daily.  180 tablet  3  . tiZANidine (ZANAFLEX) 2 MG tablet Take 2 mg by mouth 3 (three) times daily as needed for muscle spasms.      . traZODone (DESYREL) 100 MG tablet Take 50 mg by mouth at bedtime.      [name] warfarin (COUMADIN) 5 MG tablet Take 2.5-5 mg by mouth daily. Take half a tab (2.5 mg) on M,W,F and all other days take 1 tab (5 mg)      . zolpidem (AMBIEN) 10 MG tablet Take 10 mg by mouth at bedtime.      [name] lisinopril (PRINIVIL,ZESTRIL) 5 MG tablet Take 1 tablet (5 mg total) by mouth daily.  30 tablet  12  . [DISCONTINUED] atorvastatin  (  LIPITOR) 20 MG tablet Take 1 tablet (20 mg total) by mouth daily.  30 tablet  11  . [DISCONTINUED] gabapentin (NEURONTIN) 300 MG capsule Take 1 capsule (300 mg total) by mouth 3 (three) times daily.  90 capsule  5   No current facility-administered medications on file prior to visit.   Review of Systems  Constitutional: Negative for unusual diaphoresis or other sweats  HENT: Negative for ringing in ear Eyes: Negative for double vision or worsening visual disturbance.  Respiratory: Negative for choking and stridor.   Gastrointestinal: Negative for vomiting or other signifcant bowel change Genitourinary: Negative for hematuria or decreased urine volume.  Musculoskeletal: Negative for other MSK pain or swelling Skin: Negative for color change and worsening wound.  Neurological: Negative for tremors and numbness other than noted  Psychiatric/Behavioral: Negative for decreased concentration or agitation other than above       Objective:   Physical Exam BP 122/72  Pulse 80  Temp(Src) 98.2 F (36.8 C) (Oral)  Wt 328 lb (148.78 kg)  SpO2 92% VS noted,  Constitutional: Pt appears well-developed, well-nourished.  HENT: Head: NCAT.  Right Ear: External ear normal.  Left Ear: External ear normal.  Eyes: . Pupils are equal, round, and reactive to light. Conjunctivae and EOM are normal Neck: Normal range of motion. Neck supple.  Cardiovascular: Normal rate and regular rhythm.   Pulmonary/Chest: Effort normal and breath sounds normal.  - no rales or wheezing Neurological: Pt is alert. Not confused , motor grossly intact Skin: Skin is warm. Faint erythem rash nontender to neck, extremities Psychiatric: Pt behavior is normal. No agitation.     Assessment & Plan:

## 2014-06-08 NOTE — Assessment & Plan Note (Signed)
Grover's dz per derm, seems allergic today, for depomedrol IM, predpack asd,  to f/u any worsening symptoms or concerns

## 2014-06-08 NOTE — Assessment & Plan Note (Signed)
stable overall by history and exam, recent data reviewed with pt, and pt to continue medical treatment as before,  to f/u any worsening symptoms or concerns BP Readings from Last 3 Encounters:  06/08/14 122/72  06/07/14 115/54  05/22/14 138/80

## 2014-06-08 NOTE — Patient Instructions (Signed)
You had the steroid shot today  Please take all new medication as prescribed - the prednisone, and the hydrozyzine for itching as needed  Please continue all other medications as before, and refills have been done if requested.  Please have the pharmacy call with any other refills you may need.  Please keep your appointments with your specialists as you may have planned

## 2014-06-08 NOTE — Assessment & Plan Note (Signed)
stable overall by history and exam, recent data reviewed with pt, and pt to continue medical treatment as before,  to f/u any worsening symptoms or concerns Lab Results  Component Value Date   HGBA1C 5.6 02/16/2014    pt to call for onset polys or cbg > 200 with steroid tx

## 2014-06-08 NOTE — Telephone Encounter (Signed)
Patient called informed of MD instructions and scheduled appointment today at 4 with PCP.

## 2014-06-08 NOTE — Progress Notes (Signed)
Pre visit review using our clinic review tool, if applicable. No additional management support is needed unless otherwise documented below in the visit note. 

## 2014-06-11 ENCOUNTER — Other Ambulatory Visit: Payer: Self-pay

## 2014-06-11 ENCOUNTER — Encounter: Payer: Medicare HMO | Attending: Physical Medicine & Rehabilitation | Admitting: Registered Nurse

## 2014-06-11 ENCOUNTER — Encounter: Payer: Self-pay | Admitting: Registered Nurse

## 2014-06-11 VITALS — BP 151/63 | HR 80 | Resp 14 | Ht 70.0 in | Wt 323.0 lb

## 2014-06-11 DIAGNOSIS — Z79899 Other long term (current) drug therapy: Secondary | ICD-10-CM | POA: Insufficient documentation

## 2014-06-11 DIAGNOSIS — IMO0002 Reserved for concepts with insufficient information to code with codable children: Secondary | ICD-10-CM | POA: Insufficient documentation

## 2014-06-11 DIAGNOSIS — M1711 Unilateral primary osteoarthritis, right knee: Secondary | ICD-10-CM

## 2014-06-11 DIAGNOSIS — M545 Low back pain, unspecified: Secondary | ICD-10-CM | POA: Diagnosis not present

## 2014-06-11 DIAGNOSIS — I428 Other cardiomyopathies: Secondary | ICD-10-CM | POA: Diagnosis present

## 2014-06-11 DIAGNOSIS — Z5181 Encounter for therapeutic drug level monitoring: Secondary | ICD-10-CM | POA: Insufficient documentation

## 2014-06-11 DIAGNOSIS — M47814 Spondylosis without myelopathy or radiculopathy, thoracic region: Secondary | ICD-10-CM | POA: Insufficient documentation

## 2014-06-11 DIAGNOSIS — M47817 Spondylosis without myelopathy or radiculopathy, lumbosacral region: Secondary | ICD-10-CM | POA: Diagnosis present

## 2014-06-11 DIAGNOSIS — M1710 Unilateral primary osteoarthritis, unspecified knee: Secondary | ICD-10-CM | POA: Diagnosis present

## 2014-06-11 DIAGNOSIS — M1712 Unilateral primary osteoarthritis, left knee: Secondary | ICD-10-CM

## 2014-06-11 DIAGNOSIS — M5134 Other intervertebral disc degeneration, thoracic region: Secondary | ICD-10-CM

## 2014-06-11 MED ORDER — METHYLPREDNISOLONE 4 MG PO KIT: PACK | ORAL | Status: DC

## 2014-06-11 MED ORDER — OXYCODONE-ACETAMINOPHEN 7.5-325 MG PO TABS: 1.0000 | ORAL_TABLET | Freq: Four times a day (QID) | ORAL | Status: DC | PRN

## 2014-06-11 NOTE — Progress Notes (Signed)
Subjective:    Patient ID: [name], male    DOB: [birth year], 58 y.o.   MRN: [id number]  HPI: [name] is a 58 year old male who returns for follow up for chronic pain and medication refill. He says his pain is located in his  bilateral knees, right greater than left, right hand and right foot. He rates his pain level 7. His current exercise regime is walking and performing stretching exercises. He says he having increase intensity of pain in his right knee.  He is scheduled for Right Total Knee Arthroplasty on 06/15/14 with Dr. Percell Miller.  Pain Inventory Average Pain 7 Pain Right Now 7 My pain is sharp, stabbing and aching  In the last 24 hours, has pain interfered with the following? General activity 2 Relation with others 5 Enjoyment of life 5 What TIME of day is your pain at its worst? night Sleep (in general) Poor  Pain is worse with: walking and sitting Pain improves with: rest, medication and injections Relief from Meds: 5  Mobility walk with assistance use a cane how many minutes can you walk? less than 5 ability to climb steps?  yes do you drive?  yes transfers alone Do you have any goals in this area?  yes  Function disabled: date disabled 2009 I need assistance with the following:  household duties and shopping Do you have any goals in this area?  yes  Neuro/Psych weakness tremor trouble walking spasms  Prior Studies Any changes since last visit?  no  Physicians involved in your care Any changes since last visit?  yes Orthopedist murphy wainer    Family History  Problem Relation Age of Onset  . COPD Mother   . Asthma Mother   . Colon polyps Mother   . Allergies Mother   . Asthma Maternal Grandmother    History   Social History  . Marital Status: Married    Spouse Name: N/A    Number of Children: 2  . Years of Education: N/A   Occupational History  . retired/disabled. prev worked in Therapist, sports.    Social History Main  Topics  . Smoking status: Former Smoker -- 2.00 packs/day for 30 years    Types: Cigarettes    Quit date: 10/16/2007  . Smokeless tobacco: Never Used  . Alcohol Use: No  . Drug Use: No  . Sexual Activity: None   Other Topics Concern  . None   Social History Narrative   Lives alone.   Past Surgical History  Procedure Laterality Date  . Tonsillectomy    . Acne cyst removal      2 on back   . Cardioversion  04/18/2012    Procedure: CARDIOVERSION;  Surgeon: Fay Records, MD;  Location: [facility];  Service: Cardiovascular;  Laterality: N/A;  . Tee without cardioversion  04/25/2012    Procedure: TRANSESOPHAGEAL ECHOCARDIOGRAM (TEE);  Surgeon: Thayer Headings, MD;  Location: [facility];  Service: Cardiovascular;  Laterality: N/A;  . Cardioversion  04/25/2012    Procedure: CARDIOVERSION;  Surgeon: Thayer Headings, MD;  [facility];  Service: Cardiovascular;  Laterality: N/A;  . Cardioversion  04/25/2012    Procedure: CARDIOVERSION;  Surgeon: Fay Records, MD;  Location: [facility];  Service: Cardiovascular;  Laterality: N/A;  . Cardioversion  05/09/2012    Procedure: CARDIOVERSION;  Surgeon: Sherren Mocha, MD;  Location: [facility];  Service: Cardiovascular;  Laterality: N/A;  changed from crenshaw to cooper by trish/leone-endo  .  Colonoscopy    . Esophagogastroduodenoscopy     Past Medical History  Diagnosis Date  . Chronic pain syndrome   . DYSKINESIA, ESOPHAGUS   . FIBROMYALGIA   . HYPERLIPIDEMIA     but unable to take any meds  . INSOMNIA     takes Ambien nightly  . LOW BACK PAIN     DDD and Herniated disc  . ORCHITIS   . Rash and other nonspecific skin eruption 04/12/2009  . Atrial fibrillation     takes Coumadin daily along with Pacerone  . Cardiomyopathy     Myoview with prior infarct- 2012; EF 25% TEE July 2013  . H/O hiatal hernia   . Myocardial infarction     hx of MI  . ANXIETY     takes Xanax nightly  . Muscle spasm     takes Zanaflex daily as needed  . ASTHMA       Albuterol prn and Advair daily;also takes Prednisone daily  . GERD     takes Omeprazole daily  . DIABETES MELLITUS, TYPE II     takes Metformin daily  . HYPERTENSION     takes Lisinopril daily  . Peripheral edema     takes Lasix daily  . CHF (congestive heart failure)     takes Furosemide daily  . COPD (chronic obstructive pulmonary disease)   . O2 dependent     2l/m via n/c  . Shortness of breath     lying,sitting,exertion  . SLEEP APNEA, OBSTRUCTIVE     doesn't use CPAP  . Peripheral neuropathy   . Fibromyalgia   . Arthritis   . Joint pain   . Joint swelling   . Chronic constipation     takes OTC stool softener  . History of colon polyps   . Diverticulitis   . Urinary frequency   . Urinary urgency   . Nocturia   . History of kidney stones   . Anemia     supposed to be taking Vit B but doesn't  . Glaucoma   . DEPRESSION     takes Zoloft and Doxepin daily   BP 151/63  Pulse 80  Resp 14  Ht 5\' 10"  (1.778 m)  Wt 323 lb (146.512 kg)  BMI 46.35 kg/m2  SpO2 92%  Opioid Risk Score:   Fall Risk Score: Moderate Fall Risk (6-13 points) (pt educated declined handout)    Review of Systems  Respiratory: Positive for shortness of breath.   Musculoskeletal: Positive for gait problem.  Skin: Positive for rash.  Neurological: Positive for tremors and weakness.       Spasms  Hematological: Bruises/bleeds easily.  All other systems reviewed and are negative.      Objective:   Physical Exam  Nursing note and vitals reviewed. Constitutional: He is oriented to person, place, and time. He appears well-developed and well-nourished.  HENT:  Head: Normocephalic and atraumatic.  Neck: Normal range of motion. Neck supple.  Cardiovascular: Normal rate and regular rhythm.   Pulmonary/Chest: Effort normal and breath sounds normal.  Musculoskeletal: He exhibits edema.  Normal Muscle Bulk and Muscle testing Reveals: Upper Extremities Full ROM and Muscle Strength 5/5 Lower  Extremities: Full ROM and Muscle Strength 5/5 Right Knee with swelling noted, and tender with palpation. Left leg extension popping sound noted. Lower Extremities with pitting edema noted. Antalgic gait noted Wide Based gait   Neurological: He is alert and oriented to person, place, and time.  Skin: Skin is warm and dry.  Psychiatric: He has a normal mood and affect.          Assessment & Plan:  1. Chronic Thoracic and Lumbar Spine Pain: Encouraged to increase activity. Continue current medication and heat therapy regime.  2. OA bilateral knees, Right more than left:  Refilled: oxyCODONE 7.5/325mg  one tablet every 6 hours as needed #90. S/P Right Knee injection with celestone and lidocaine. Minimal relief noted. Medrol Dose pak:  According to Epi he is  Scheduled for Total Right Knee Arthroplasty on 06/15/14 with Dr. Percell Miller. Left message for patient to call office. 3. Obesity: Encourage to lose weight and start an exercise program: Verbalized understanding.   20 minutes of face to face patient care time was spent during this visit. All questions were encouraged and answered.   F/U in 1 month

## 2014-06-14 MED ORDER — DEXTROSE 5 % IV SOLN
3.0000 g | INTRAVENOUS | Status: AC
  Administered 2014-06-15: 3 g via INTRAVENOUS
  Filled 2014-06-14: qty 3000

## 2014-06-14 NOTE — Anesthesia Preprocedure Evaluation (Addendum)
Anesthesia Evaluation  Patient identified by MRN, date of birth, ID band Patient awake    Reviewed: Allergy & Precautions, H&P , NPO status , Patient's Chart, lab work & pertinent test results  Airway Mallampati: II TM Distance: >3 FB     Dental  (+) Teeth Intact   Pulmonary shortness of breath and with exertion, asthma (medicated) , sleep apnea (Does not tolerate CPAP.  Sleep study documents prolonged desaturations) , COPD (PFT show both restrictive and obsgtructive components  FVC 71% of predicted  FEV25-75 about 50% predicted.  On several pu;monary meds) COPD inhaler and oxygen dependent, former smoker,  Significant pulmonary disease both from 60 pack year smoking, quit 2009, and BMI 40.  OSA, COPD, and Asthma breath sounds clear to auscultation        Cardiovascular Exercise Tolerance: Poor hypertension, Pt. on medications + Past MI (In past  no wall motion abnormality seen on ECHO.  Stress test no areas of reversible ischemia), + Peripheral Vascular Disease and +CHF (Ef 60% 06/2425, mild Diastolic dysfunction) + dysrhythmias (sinus rhythm last EKG.  Several cardioversions.  Coumadin to be stoped 5 days prior to surgery) Atrial Fibrillation Rhythm:Regular Rate:Normal     Neuro/Psych Anxiety Depression    GI/Hepatic GERD-  Medicated and Controlled,  Endo/Other  diabetes, Well Controlled, Type 2, Oral Hypoglycemic Agents  Renal/GU      Musculoskeletal  (+) Arthritis -, on steriods ,  Fibromyalgia -  Abdominal (+) + obese,   Peds  Hematology  (+) anemia , H/H 10/30   Anesthesia Other Findings   Reproductive/Obstetrics                        Anesthesia Physical Anesthesia Plan  ASA: IV  Anesthesia Plan: General   Post-op Pain Management: MAC Combined w/ Regional for Post-op pain   Induction: Intravenous  Airway Management Planned: Oral ETT  Additional Equipment:   Intra-op Plan:    Post-operative Plan: Extubation in OR  Informed Consent: I have reviewed the patients History and Physical, chart, labs and discussed the procedure including the risks, benefits and alternatives for the proposed anesthesia with the patient or authorized representative who has indicated his/her understanding and acceptance.     Plan Discussed with:   Anesthesia Plan Comments: (High risk for many reasons.  Will try to minimize narcotics and respiratory depressants.  Femoral nerve block to augment pain control and decrease needed levels of anesthesia.  Will need cortisone as he on prednisone.  Will use multinodal analgiesia including IV tyelenol, low dose Ketamine, and low dose Precedex if CV system can tolerate.  Would consider type and screan as he has decreased H/H and may need red cell mass post op to increase oxygen delivery.)        Anesthesia Quick Evaluation

## 2014-06-15 ENCOUNTER — Encounter (HOSPITAL_COMMUNITY): Payer: Self-pay | Admitting: *Deleted

## 2014-06-15 ENCOUNTER — Inpatient Hospital Stay (HOSPITAL_COMMUNITY): Payer: Medicare HMO

## 2014-06-15 ENCOUNTER — Encounter (HOSPITAL_COMMUNITY): Payer: Medicare HMO | Admitting: Emergency Medicine

## 2014-06-15 ENCOUNTER — Inpatient Hospital Stay (HOSPITAL_COMMUNITY): Payer: Medicare HMO | Admitting: Anesthesiology

## 2014-06-15 ENCOUNTER — Encounter (HOSPITAL_COMMUNITY)
Admission: RE | Disposition: A | Discharge status: Home Health | Payer: Self-pay | Source: Ambulatory Visit | Attending: Orthopedic Surgery

## 2014-06-15 ENCOUNTER — Institutional Professional Consult (permissible substitution): Payer: Commercial Managed Care - HMO | Admitting: Internal Medicine

## 2014-06-15 ENCOUNTER — Inpatient Hospital Stay (HOSPITAL_COMMUNITY)
Admission: RE | Admit: 2014-06-15 | Discharge: 2014-06-17 | DRG: 470 | Disposition: A | Discharge status: Home Health | Payer: Medicare HMO | Source: Ambulatory Visit | Attending: Orthopedic Surgery | Admitting: Orthopedic Surgery

## 2014-06-15 DIAGNOSIS — M1710 Unilateral primary osteoarthritis, unspecified knee: Secondary | ICD-10-CM | POA: Diagnosis not present

## 2014-06-15 DIAGNOSIS — D62 Acute posthemorrhagic anemia: Secondary | ICD-10-CM | POA: Diagnosis not present

## 2014-06-15 DIAGNOSIS — Z9981 Dependence on supplemental oxygen: Secondary | ICD-10-CM

## 2014-06-15 DIAGNOSIS — H409 Unspecified glaucoma: Secondary | ICD-10-CM | POA: Diagnosis present

## 2014-06-15 DIAGNOSIS — K219 Gastro-esophageal reflux disease without esophagitis: Secondary | ICD-10-CM | POA: Diagnosis present

## 2014-06-15 DIAGNOSIS — E119 Type 2 diabetes mellitus without complications: Secondary | ICD-10-CM | POA: Diagnosis present

## 2014-06-15 DIAGNOSIS — I252 Old myocardial infarction: Secondary | ICD-10-CM

## 2014-06-15 DIAGNOSIS — I4891 Unspecified atrial fibrillation: Secondary | ICD-10-CM | POA: Diagnosis present

## 2014-06-15 DIAGNOSIS — Z87891 Personal history of nicotine dependence: Secondary | ICD-10-CM | POA: Diagnosis not present

## 2014-06-15 DIAGNOSIS — J449 Chronic obstructive pulmonary disease, unspecified: Secondary | ICD-10-CM | POA: Diagnosis present

## 2014-06-15 DIAGNOSIS — I428 Other cardiomyopathies: Secondary | ICD-10-CM | POA: Diagnosis present

## 2014-06-15 DIAGNOSIS — F3289 Other specified depressive episodes: Secondary | ICD-10-CM | POA: Diagnosis present

## 2014-06-15 DIAGNOSIS — J4489 Other specified chronic obstructive pulmonary disease: Secondary | ICD-10-CM | POA: Diagnosis present

## 2014-06-15 DIAGNOSIS — G4733 Obstructive sleep apnea (adult) (pediatric): Secondary | ICD-10-CM | POA: Diagnosis present

## 2014-06-15 DIAGNOSIS — Z7901 Long term (current) use of anticoagulants: Secondary | ICD-10-CM

## 2014-06-15 DIAGNOSIS — M25569 Pain in unspecified knee: Secondary | ICD-10-CM | POA: Diagnosis present

## 2014-06-15 DIAGNOSIS — G8918 Other acute postprocedural pain: Secondary | ICD-10-CM | POA: Diagnosis not present

## 2014-06-15 DIAGNOSIS — F329 Major depressive disorder, single episode, unspecified: Secondary | ICD-10-CM | POA: Diagnosis present

## 2014-06-15 DIAGNOSIS — E785 Hyperlipidemia, unspecified: Secondary | ICD-10-CM | POA: Diagnosis present

## 2014-06-15 DIAGNOSIS — Z79899 Other long term (current) drug therapy: Secondary | ICD-10-CM

## 2014-06-15 DIAGNOSIS — Z860100 Personal history of colon polyps, unspecified: Secondary | ICD-10-CM

## 2014-06-15 DIAGNOSIS — Z6841 Body Mass Index (BMI) 40.0 and over, adult: Secondary | ICD-10-CM | POA: Diagnosis not present

## 2014-06-15 DIAGNOSIS — I10 Essential (primary) hypertension: Secondary | ICD-10-CM | POA: Diagnosis present

## 2014-06-15 DIAGNOSIS — G609 Hereditary and idiopathic neuropathy, unspecified: Secondary | ICD-10-CM | POA: Diagnosis present

## 2014-06-15 DIAGNOSIS — G894 Chronic pain syndrome: Secondary | ICD-10-CM | POA: Diagnosis present

## 2014-06-15 HISTORY — PX: TOTAL KNEE ARTHROPLASTY: SHX125

## 2014-06-15 LAB — GLUCOSE, CAPILLARY
GLUCOSE-CAPILLARY: 108 mg/dL — AB (ref 70–99)
GLUCOSE-CAPILLARY: 134 mg/dL — AB (ref 70–99)
GLUCOSE-CAPILLARY: 161 mg/dL — AB (ref 70–99)
GLUCOSE-CAPILLARY: 210 mg/dL — AB (ref 70–99)

## 2014-06-15 LAB — CBC
HEMATOCRIT: 26.6 % — AB (ref 39.0–52.0)
Hemoglobin: 8.7 g/dL — ABNORMAL LOW (ref 13.0–17.0)
MCH: 26.3 pg (ref 26.0–34.0)
MCHC: 32.7 g/dL (ref 30.0–36.0)
MCV: 80.4 fL (ref 78.0–100.0)
Platelets: 315 10*3/uL (ref 150–400)
RBC: 3.31 MIL/uL — ABNORMAL LOW (ref 4.22–5.81)
RDW: 14.5 % (ref 11.5–15.5)
WBC: 14 10*3/uL — ABNORMAL HIGH (ref 4.0–10.5)

## 2014-06-15 LAB — PROTIME-INR
INR: 1.18 (ref 0.00–1.49)
Prothrombin Time: 15 seconds (ref 11.6–15.2)

## 2014-06-15 LAB — APTT: aPTT: 24 seconds (ref 24–37)

## 2014-06-15 SURGERY — ARTHROPLASTY, KNEE, TOTAL
Anesthesia: General | Site: Knee | Laterality: Right

## 2014-06-15 MED ORDER — FENTANYL CITRATE 0.05 MG/ML IJ SOLN
INTRAMUSCULAR | Status: AC
  Filled 2014-06-15: qty 5

## 2014-06-15 MED ORDER — SERTRALINE HCL 100 MG PO TABS
200.0000 mg | ORAL_TABLET | Freq: Every day | ORAL | Status: DC
  Administered 2014-06-16 – 2014-06-17 (×2): 200 mg via ORAL
  Filled 2014-06-15 (×3): qty 2

## 2014-06-15 MED ORDER — MORPHINE SULFATE 2 MG/ML IJ SOLN
INTRAMUSCULAR | Status: AC
  Administered 2014-06-15: 2 mg via INTRAVENOUS
  Filled 2014-06-15: qty 1

## 2014-06-15 MED ORDER — LIDOCAINE HCL (CARDIAC) 20 MG/ML IV SOLN
INTRAVENOUS | Status: AC
  Filled 2014-06-15: qty 5

## 2014-06-15 MED ORDER — ONDANSETRON HCL 4 MG/2ML IJ SOLN
4.0000 mg | Freq: Four times a day (QID) | INTRAMUSCULAR | Status: DC | PRN
  Administered 2014-06-15 – 2014-06-16 (×2): 4 mg via INTRAVENOUS
  Filled 2014-06-15 (×2): qty 2

## 2014-06-15 MED ORDER — ACETAMINOPHEN 500 MG PO TABS
1000.0000 mg | ORAL_TABLET | Freq: Once | ORAL | Status: AC
  Administered 2014-06-15: 1000 mg via ORAL
  Filled 2014-06-15: qty 2

## 2014-06-15 MED ORDER — HYDROCORTISONE NA SUCCINATE PF 100 MG IJ SOLR
INTRAMUSCULAR | Status: AC
  Filled 2014-06-15: qty 2

## 2014-06-15 MED ORDER — FENTANYL CITRATE 0.05 MG/ML IJ SOLN
25.0000 ug | INTRAMUSCULAR | Status: DC | PRN
  Administered 2014-06-15 (×4): 25 ug via INTRAVENOUS

## 2014-06-15 MED ORDER — DIPHENHYDRAMINE HCL 50 MG/ML IJ SOLN
INTRAMUSCULAR | Status: DC | PRN
  Administered 2014-06-15: 25 mg via INTRAVENOUS

## 2014-06-15 MED ORDER — MENTHOL 3 MG MT LOZG
1.0000 | LOZENGE | OROMUCOSAL | Status: DC | PRN
Start: 2014-06-15 — End: 2014-06-17

## 2014-06-15 MED ORDER — DEXTROSE-NACL 5-0.45 % IV SOLN: 100.0000 mL/h | INTRAVENOUS | Status: DC

## 2014-06-15 MED ORDER — ZOLPIDEM TARTRATE 5 MG PO TABS
10.0000 mg | ORAL_TABLET | Freq: Every day | ORAL | Status: DC
  Administered 2014-06-15 – 2014-06-16 (×2): 10 mg via ORAL
  Filled 2014-06-15 (×2): qty 2

## 2014-06-15 MED ORDER — FENTANYL CITRATE 0.05 MG/ML IJ SOLN
INTRAMUSCULAR | Status: DC | PRN
  Administered 2014-06-15: 25 ug via INTRAVENOUS
  Administered 2014-06-15 (×4): 50 ug via INTRAVENOUS
  Administered 2014-06-15: 100 ug via INTRAVENOUS
  Administered 2014-06-15 (×3): 50 ug via INTRAVENOUS
  Administered 2014-06-15: 25 ug via INTRAVENOUS

## 2014-06-15 MED ORDER — DEXMEDETOMIDINE HCL IN NACL 200 MCG/50ML IV SOLN
INTRAVENOUS | Status: AC
  Filled 2014-06-15: qty 50

## 2014-06-15 MED ORDER — DOXEPIN HCL 25 MG PO CAPS
25.0000 mg | ORAL_CAPSULE | Freq: Two times a day (BID) | ORAL | Status: DC
  Administered 2014-06-15 – 2014-06-17 (×4): 25 mg via ORAL
  Filled 2014-06-15 (×7): qty 1

## 2014-06-15 MED ORDER — CARISOPRODOL 350 MG PO TABS
1050.0000 mg | ORAL_TABLET | Freq: Every day | ORAL | Status: DC
  Administered 2014-06-15 – 2014-06-16 (×2): 1050 mg via ORAL
  Filled 2014-06-15 (×2): qty 3

## 2014-06-15 MED ORDER — TIZANIDINE HCL 2 MG PO TABS
2.0000 mg | ORAL_TABLET | Freq: Three times a day (TID) | ORAL | Status: DC | PRN
  Filled 2014-06-15: qty 1

## 2014-06-15 MED ORDER — PROPOFOL 10 MG/ML IV BOLUS
INTRAVENOUS | Status: AC
  Filled 2014-06-15: qty 20

## 2014-06-15 MED ORDER — WARFARIN SODIUM 7.5 MG PO TABS
7.5000 mg | ORAL_TABLET | Freq: Once | ORAL | Status: AC
  Administered 2014-06-15: 7.5 mg via ORAL
  Filled 2014-06-15: qty 1

## 2014-06-15 MED ORDER — LIDOCAINE HCL (CARDIAC) 20 MG/ML IV SOLN
INTRAVENOUS | Status: DC | PRN
  Administered 2014-06-15: 100 mg via INTRAVENOUS

## 2014-06-15 MED ORDER — OXYCODONE HCL 5 MG PO TABS
5.0000 mg | ORAL_TABLET | ORAL | Status: DC | PRN
  Administered 2014-06-15 – 2014-06-17 (×12): 10 mg via ORAL
  Filled 2014-06-15 (×5): qty 2
  Filled 2014-06-15: qty 1
  Filled 2014-06-15 (×6): qty 2
  Filled 2014-06-15: qty 1

## 2014-06-15 MED ORDER — ALPRAZOLAM 0.5 MG PO TABS
1.0000 mg | ORAL_TABLET | Freq: Every day | ORAL | Status: DC
  Administered 2014-06-15 – 2014-06-16 (×2): 1 mg via ORAL
  Filled 2014-06-15 (×2): qty 2

## 2014-06-15 MED ORDER — 0.9 % SODIUM CHLORIDE (POUR BTL) OPTIME
TOPICAL | Status: DC | PRN
  Administered 2014-06-15: 1000 mL

## 2014-06-15 MED ORDER — PROMETHAZINE HCL 25 MG/ML IJ SOLN: 6.2500 mg | INTRAMUSCULAR | Status: DC | PRN

## 2014-06-15 MED ORDER — ACETAMINOPHEN 325 MG PO TABS: 650.0000 mg | ORAL_TABLET | Freq: Four times a day (QID) | ORAL | Status: DC | PRN

## 2014-06-15 MED ORDER — SODIUM CHLORIDE 0.9 % IV SOLN
INTRAVENOUS | Status: DC | PRN
  Administered 2014-06-15: 09:00:00

## 2014-06-15 MED ORDER — ROCURONIUM BROMIDE 50 MG/5ML IV SOLN
INTRAVENOUS | Status: AC
  Filled 2014-06-15: qty 1

## 2014-06-15 MED ORDER — MOMETASONE FURO-FORMOTEROL FUM 100-5 MCG/ACT IN AERO
2.0000 | INHALATION_SPRAY | Freq: Two times a day (BID) | RESPIRATORY_TRACT | Status: DC
  Administered 2014-06-15 – 2014-06-17 (×4): 2 via RESPIRATORY_TRACT
  Filled 2014-06-15 (×2): qty 8.8

## 2014-06-15 MED ORDER — HYDROCORTISONE NA SUCCINATE PF 1000 MG IJ SOLR
INTRAMUSCULAR | Status: DC | PRN
  Administered 2014-06-15: 100 mg via INTRAVENOUS

## 2014-06-15 MED ORDER — DEXTROSE-NACL 5-0.45 % IV SOLN
INTRAVENOUS | Status: AC
  Administered 2014-06-15: 12:00:00 via INTRAVENOUS

## 2014-06-15 MED ORDER — DEXAMETHASONE SODIUM PHOSPHATE 10 MG/ML IJ SOLN
INTRAMUSCULAR | Status: DC | PRN
  Administered 2014-06-15: 10 mg via INTRAVENOUS

## 2014-06-15 MED ORDER — DIGOXIN 125 MCG PO TABS
0.1250 mg | ORAL_TABLET | Freq: Every day | ORAL | Status: DC
  Administered 2014-06-16 – 2014-06-17 (×2): 0.125 mg via ORAL
  Filled 2014-06-15 (×3): qty 1

## 2014-06-15 MED ORDER — PROPOFOL 10 MG/ML IV BOLUS
INTRAVENOUS | Status: DC | PRN
  Administered 2014-06-15: 150 mg via INTRAVENOUS

## 2014-06-15 MED ORDER — MORPHINE SULFATE 2 MG/ML IJ SOLN
2.0000 mg | INTRAMUSCULAR | Status: DC | PRN
  Administered 2014-06-15 – 2014-06-16 (×7): 2 mg via INTRAVENOUS
  Filled 2014-06-15 (×6): qty 1

## 2014-06-15 MED ORDER — ONDANSETRON HCL 4 MG PO TABS: 4.0000 mg | ORAL_TABLET | Freq: Four times a day (QID) | ORAL | Status: DC | PRN

## 2014-06-15 MED ORDER — BUPIVACAINE-EPINEPHRINE (PF) 0.5% -1:200000 IJ SOLN
INTRAMUSCULAR | Status: DC | PRN
  Administered 2014-06-15: 30 mL via PERINEURAL

## 2014-06-15 MED ORDER — WARFARIN SODIUM 2.5 MG PO TABS: 2.5000 mg | ORAL_TABLET | Freq: Every day | ORAL | Status: DC

## 2014-06-15 MED ORDER — ONDANSETRON HCL 4 MG/2ML IJ SOLN
INTRAMUSCULAR | Status: DC | PRN
  Administered 2014-06-15: 4 mg via INTRAVENOUS

## 2014-06-15 MED ORDER — DEXAMETHASONE 4 MG PO TABS
10.0000 mg | ORAL_TABLET | Freq: Three times a day (TID) | ORAL | Status: AC
  Administered 2014-06-15 – 2014-06-16 (×2): 10 mg via ORAL
  Filled 2014-06-15 (×3): qty 1

## 2014-06-15 MED ORDER — DEXAMETHASONE SODIUM PHOSPHATE 10 MG/ML IJ SOLN
10.0000 mg | Freq: Three times a day (TID) | INTRAMUSCULAR | Status: AC
  Administered 2014-06-15: 10 mg via INTRAVENOUS
  Filled 2014-06-15 (×3): qty 1

## 2014-06-15 MED ORDER — CELECOXIB 200 MG PO CAPS
200.0000 mg | ORAL_CAPSULE | Freq: Two times a day (BID) | ORAL | Status: DC
  Administered 2014-06-15 – 2014-06-17 (×5): 200 mg via ORAL
  Filled 2014-06-15 (×6): qty 1

## 2014-06-15 MED ORDER — FUROSEMIDE 40 MG PO TABS
40.0000 mg | ORAL_TABLET | Freq: Two times a day (BID) | ORAL | Status: DC
  Administered 2014-06-16 – 2014-06-17 (×3): 40 mg via ORAL
  Filled 2014-06-15 (×6): qty 1

## 2014-06-15 MED ORDER — ONDANSETRON HCL 4 MG/2ML IJ SOLN
INTRAMUSCULAR | Status: AC
  Filled 2014-06-15: qty 2

## 2014-06-15 MED ORDER — TRAZODONE HCL 50 MG PO TABS
50.0000 mg | ORAL_TABLET | Freq: Every day | ORAL | Status: DC
  Administered 2014-06-15 – 2014-06-16 (×2): 50 mg via ORAL
  Filled 2014-06-15 (×3): qty 1

## 2014-06-15 MED ORDER — ACETAMINOPHEN 10 MG/ML IV SOLN
1000.0000 mg | Freq: Once | INTRAVENOUS | Status: AC
  Administered 2014-06-15: 1000 mg via INTRAVENOUS
  Filled 2014-06-15: qty 100

## 2014-06-15 MED ORDER — MIDAZOLAM HCL 5 MG/5ML IJ SOLN
INTRAMUSCULAR | Status: DC | PRN
  Administered 2014-06-15: 0.5 mg via INTRAVENOUS
  Administered 2014-06-15: 1 mg via INTRAVENOUS
  Administered 2014-06-15: 0.5 mg via INTRAVENOUS

## 2014-06-15 MED ORDER — ARTIFICIAL TEARS OP OINT
TOPICAL_OINTMENT | OPHTHALMIC | Status: DC | PRN
  Administered 2014-06-15: 1 via OPHTHALMIC

## 2014-06-15 MED ORDER — MIDAZOLAM HCL 2 MG/2ML IJ SOLN
INTRAMUSCULAR | Status: AC
  Filled 2014-06-15: qty 2

## 2014-06-15 MED ORDER — LACTATED RINGERS IV SOLN
INTRAVENOUS | Status: DC | PRN
  Administered 2014-06-15 (×2): via INTRAVENOUS

## 2014-06-15 MED ORDER — PANTOPRAZOLE SODIUM 40 MG PO TBEC
40.0000 mg | DELAYED_RELEASE_TABLET | Freq: Every day | ORAL | Status: DC
  Administered 2014-06-16 – 2014-06-17 (×2): 40 mg via ORAL
  Filled 2014-06-15 (×2): qty 1

## 2014-06-15 MED ORDER — METOCLOPRAMIDE HCL 10 MG PO TABS: 5.0000 mg | ORAL_TABLET | Freq: Three times a day (TID) | ORAL | Status: DC | PRN

## 2014-06-15 MED ORDER — SUCCINYLCHOLINE CHLORIDE 20 MG/ML IJ SOLN
INTRAMUSCULAR | Status: DC | PRN
Start: 2014-06-15 — End: 2014-06-15
  Administered 2014-06-15: 140 mg via INTRAVENOUS

## 2014-06-15 MED ORDER — AMIODARONE HCL 100 MG PO TABS
100.0000 mg | ORAL_TABLET | Freq: Every day | ORAL | Status: DC
  Administered 2014-06-16 – 2014-06-17 (×2): 100 mg via ORAL
  Filled 2014-06-15 (×3): qty 1

## 2014-06-15 MED ORDER — MEPERIDINE HCL 25 MG/ML IJ SOLN: 6.2500 mg | INTRAMUSCULAR | Status: DC | PRN

## 2014-06-15 MED ORDER — DOCUSATE SODIUM 100 MG PO CAPS
100.0000 mg | ORAL_CAPSULE | Freq: Two times a day (BID) | ORAL | Status: DC
  Administered 2014-06-15 – 2014-06-17 (×5): 100 mg via ORAL
  Filled 2014-06-15 (×6): qty 1

## 2014-06-15 MED ORDER — LISINOPRIL 5 MG PO TABS
5.0000 mg | ORAL_TABLET | Freq: Every day | ORAL | Status: DC
  Administered 2014-06-15 – 2014-06-17 (×3): 5 mg via ORAL
  Filled 2014-06-15 (×3): qty 1

## 2014-06-15 MED ORDER — FENTANYL CITRATE 0.05 MG/ML IJ SOLN
INTRAMUSCULAR | Status: AC
  Administered 2014-06-15: 25 ug via INTRAVENOUS
  Filled 2014-06-15: qty 2

## 2014-06-15 MED ORDER — ALBUTEROL SULFATE (2.5 MG/3ML) 0.083% IN NEBU
2.0000 mL | INHALATION_SOLUTION | Freq: Four times a day (QID) | RESPIRATORY_TRACT | Status: DC
  Administered 2014-06-15 (×2): 3 mL via RESPIRATORY_TRACT
  Administered 2014-06-16 (×2): 2 mL via RESPIRATORY_TRACT
  Administered 2014-06-16 – 2014-06-17 (×2): 3 mL via RESPIRATORY_TRACT
  Filled 2014-06-15 (×6): qty 3

## 2014-06-15 MED ORDER — BUPIVACAINE LIPOSOME 1.3 % IJ SUSP
20.0000 mL | Freq: Once | INTRAMUSCULAR | Status: DC
  Filled 2014-06-15: qty 20

## 2014-06-15 MED ORDER — ACETAMINOPHEN 650 MG RE SUPP: 650.0000 mg | Freq: Four times a day (QID) | RECTAL | Status: DC | PRN

## 2014-06-15 MED ORDER — WARFARIN - PHARMACIST DOSING INPATIENT: Freq: Every day | Status: DC

## 2014-06-15 MED ORDER — METFORMIN HCL 500 MG PO TABS
500.0000 mg | ORAL_TABLET | Freq: Two times a day (BID) | ORAL | Status: DC
  Administered 2014-06-15 – 2014-06-17 (×4): 500 mg via ORAL
  Filled 2014-06-15 (×6): qty 1

## 2014-06-15 MED ORDER — DEXMEDETOMIDINE HCL 200 MCG/2ML IV SOLN
INTRAVENOUS | Status: DC | PRN
  Administered 2014-06-15: 4 ug via INTRAVENOUS
  Administered 2014-06-15: 8 ug via INTRAVENOUS
  Administered 2014-06-15: 4 ug via INTRAVENOUS
  Administered 2014-06-15: 8 ug via INTRAVENOUS

## 2014-06-15 MED ORDER — METOCLOPRAMIDE HCL 5 MG/ML IJ SOLN
5.0000 mg | Freq: Three times a day (TID) | INTRAMUSCULAR | Status: DC | PRN
  Administered 2014-06-15: 10 mg via INTRAVENOUS
  Filled 2014-06-15: qty 2

## 2014-06-15 MED ORDER — PHENOL 1.4 % MT LIQD
1.0000 | OROMUCOSAL | Status: DC | PRN
  Administered 2014-06-16: 1 via OROMUCOSAL
  Filled 2014-06-15: qty 177

## 2014-06-15 MED ORDER — CEFAZOLIN SODIUM-DEXTROSE 2-3 GM-% IV SOLR
2.0000 g | Freq: Four times a day (QID) | INTRAVENOUS | Status: AC
Start: 2014-06-15 — End: 2014-06-15
  Administered 2014-06-15 (×2): 2 g via INTRAVENOUS
  Filled 2014-06-15 (×2): qty 50

## 2014-06-15 SURGICAL SUPPLY — 58 items
ADH SKN CLS APL DERMABOND .7 (GAUZE/BANDAGES/DRESSINGS) ×1
BANDAGE ELASTIC 6 VELCRO ST LF (GAUZE/BANDAGES/DRESSINGS) ×1 IMPLANT
BANDAGE ESMARK 6X9 LF (GAUZE/BANDAGES/DRESSINGS) ×1 IMPLANT
BLADE SAG 18X100X1.27 (BLADE) ×3 IMPLANT
BLADE SAGITTAL (BLADE) ×2
BLADE SAW THK.89X75X18XSGTL (BLADE) IMPLANT
BNDG CMPR 9X6 STRL LF SNTH (GAUZE/BANDAGES/DRESSINGS) ×1
BNDG ESMARK 6X9 LF (GAUZE/BANDAGES/DRESSINGS) ×2
BNDG GAUZE ELAST 4 BULKY (GAUZE/BANDAGES/DRESSINGS) ×1 IMPLANT
BOWL SMART MIX CTS (DISPOSABLE) ×2 IMPLANT
CEMENT HV SMART SET (Cement) ×2 IMPLANT
COVER SURGICAL LIGHT HANDLE (MISCELLANEOUS) ×2 IMPLANT
CUFF TOURNIQUET SINGLE 34IN LL (TOURNIQUET CUFF) ×2 IMPLANT
DERMABOND ADVANCED (GAUZE/BANDAGES/DRESSINGS) ×1
DERMABOND ADVANCED .7 DNX12 (GAUZE/BANDAGES/DRESSINGS) IMPLANT
DRAPE EXTREMITY T 121X128X90 (DRAPE) ×2 IMPLANT
DRAPE IMP U-DRAPE 54X76 (DRAPES) ×2 IMPLANT
DRAPE PROXIMA HALF (DRAPES) ×2 IMPLANT
DRAPE U-SHAPE 47X51 STRL (DRAPES) ×2 IMPLANT
DRSG AQUACEL AG ADV 3.5X10 (GAUZE/BANDAGES/DRESSINGS) ×1 IMPLANT
DURAPREP 26ML APPLICATOR (WOUND CARE) ×4 IMPLANT
ELECT CAUTERY BLADE 6.4 (BLADE) ×2 IMPLANT
ELECT REM PT RETURN 9FT ADLT (ELECTROSURGICAL) ×2
ELECTRODE REM PT RTRN 9FT ADLT (ELECTROSURGICAL) ×1 IMPLANT
FACESHIELD WRAPAROUND (MASK) ×2 IMPLANT
FACESHIELD WRAPAROUND OR TEAM (MASK) ×1 IMPLANT
GAUZE SPONGE 4X4 12PLY STRL (GAUZE/BANDAGES/DRESSINGS) ×2 IMPLANT
GLOVE BIO SURGEON STRL SZ7 (GLOVE) ×1 IMPLANT
GLOVE BIO SURGEON STRL SZ7.5 (GLOVE) ×2 IMPLANT
GLOVE BIOGEL PI IND STRL 8 (GLOVE) ×1 IMPLANT
GLOVE BIOGEL PI INDICATOR 8 (GLOVE) ×1
GOWN STRL REUS W/ TWL LRG LVL3 (GOWN DISPOSABLE) ×2 IMPLANT
GOWN STRL REUS W/TWL LRG LVL3 (GOWN DISPOSABLE) ×4
HANDPIECE INTERPULSE COAX TIP (DISPOSABLE) ×2
IMMOBILIZER KNEE 22 UNIV (SOFTGOODS) ×2 IMPLANT
KIT BASIN OR (CUSTOM PROCEDURE TRAY) ×2 IMPLANT
KIT ROOM TURNOVER OR (KITS) ×2 IMPLANT
KNEE/VIT E POLY LINER LEVEL 1B ×1 IMPLANT
MANIFOLD NEPTUNE II (INSTRUMENTS) ×2 IMPLANT
NDL 18GX1X1/2 (RX/OR ONLY) (NEEDLE) ×1 IMPLANT
NEEDLE 18GX1X1/2 (RX/OR ONLY) (NEEDLE) ×2 IMPLANT
NS IRRIG 1000ML POUR BTL (IV SOLUTION) ×2 IMPLANT
PACK TOTAL JOINT (CUSTOM PROCEDURE TRAY) ×2 IMPLANT
PAD ABD 8X10 STRL (GAUZE/BANDAGES/DRESSINGS) ×1 IMPLANT
PAD ARMBOARD 7.5X6 YLW CONV (MISCELLANEOUS) ×4 IMPLANT
SET HNDPC FAN SPRY TIP SCT (DISPOSABLE) IMPLANT
SPONGE GAUZE 4X4 12PLY STER LF (GAUZE/BANDAGES/DRESSINGS) ×1 IMPLANT
SUCTION FRAZIER TIP 10 FR DISP (SUCTIONS) ×2 IMPLANT
SUT MNCRL AB 4-0 PS2 18 (SUTURE) ×2 IMPLANT
SUT MON AB 2-0 CT1 27 (SUTURE) ×4 IMPLANT
SUT MON AB 2-0 CT1 36 (SUTURE) ×1 IMPLANT
SUT VIC AB 0 CT1 27 (SUTURE) ×2
SUT VIC AB 0 CT1 27XBRD ANBCTR (SUTURE) ×1 IMPLANT
SUT VIC AB 1 CTX 36 (SUTURE) ×4
SUT VIC AB 1 CTX36XBRD ANBCTR (SUTURE) ×2 IMPLANT
SYR 50ML LL SCALE MARK (SYRINGE) ×2 IMPLANT
TOWEL OR 17X24 6PK STRL BLUE (TOWEL DISPOSABLE) ×2 IMPLANT
TOWEL OR 17X26 10 PK STRL BLUE (TOWEL DISPOSABLE) ×2 IMPLANT

## 2014-06-15 NOTE — Anesthesia Procedure Notes (Addendum)
Anesthesia Regional Block:  Femoral nerve block  Pre-Anesthetic Checklist: ,, timeout performed,, Correct Site,, Correct Procedure,, site marked,,, surgical consent,, at surgeon's request  Laterality: Right  Prep: Maximum Sterile Barrier Precautions used and chloraprep       Needles:  Injection technique: Single-shot     Needle Length: 9cm 9 cm Needle Gauge: 21 and 21 G    Additional Needles:  Procedures: ultrasound guided (picture in chart) and nerve stimulator Femoral nerve block  Nerve Stimulator or Paresthesia:  Response: 0.3 mA,   Additional Responses:   Narrative:   Performed by: Personally  Anesthesiologist: manny   Procedure Name: Intubation Date/Time: 06/15/2014 7:48 AM Performed by: Ollen Bowl Pre-anesthesia Checklist: Patient identified, Emergency Drugs available, Suction available, Patient being monitored and Timeout performed Patient Re-evaluated:Patient Re-evaluated prior to inductionOxygen Delivery Method: Circle system utilized and Simple face mask Preoxygenation: Pre-oxygenation with 100% oxygen Intubation Type: Combination inhalational/ intravenous induction Ventilation: Mask ventilation without difficulty Laryngoscope Size: Mac and 4 Grade View: Grade I Tube type: Oral Tube size: 7.5 mm Number of attempts: 1 Airway Equipment and Method: Patient positioned with wedge pillow and Stylet Placement Confirmation: ETT inserted through vocal cords under direct vision,  positive ETCO2 and breath sounds checked- equal and bilateral Secured at: 22 cm Tube secured with: Tape Dental Injury: Teeth and Oropharynx as per pre-operative assessment

## 2014-06-15 NOTE — Plan of Care (Signed)
Problem: Consults Goal: Diagnosis- Total Joint Replacement Primary Total Knee     

## 2014-06-15 NOTE — Progress Notes (Signed)
ANTICOAGULATION CONSULT NOTE - Initial Consult  Pharmacy Consult for Coumadin  Indication: atrial fibrillation & s/p R TKA   Allergies  Allergen Reactions  . Simvastatin     REACTION: severe leg cramps  . Statins Other (See Comments)    myalgia    Patient Measurements: Weight: 323 lb (146.512 kg)  Vital Signs: Temp: 97.9 F (36.6 C) (09/01 1140) Temp src: Oral (09/01 0617) BP: 148/59 mmHg (09/01 1140) Pulse Rate: 72 (09/01 1140)  Labs:  Recent Labs  06/15/14 0640  APTT 24  LABPROT 15.0  INR 1.18    The CrCl is unknown because both a height and weight (above a minimum accepted value) are required for this calculation.   Medical History: Past Medical History  Diagnosis Date  . Chronic pain syndrome   . DYSKINESIA, ESOPHAGUS   . FIBROMYALGIA   . HYPERLIPIDEMIA     but unable to take any meds  . INSOMNIA     takes Ambien nightly  . LOW BACK PAIN     DDD and Herniated disc  . ORCHITIS   . Rash and other nonspecific skin eruption 04/12/2009  . Atrial fibrillation     takes Coumadin daily along with Pacerone  . Cardiomyopathy     Myoview with prior infarct- 2012; EF 25% TEE July 2013  . H/O hiatal hernia   . Myocardial infarction     hx of MI  . ANXIETY     takes Xanax nightly  . Muscle spasm     takes Zanaflex daily as needed  . ASTHMA     Albuterol prn and Advair daily;also takes Prednisone daily  . GERD     takes Omeprazole daily  . DIABETES MELLITUS, TYPE II     takes Metformin daily  . HYPERTENSION     takes Lisinopril daily  . Peripheral edema     takes Lasix daily  . CHF (congestive heart failure)     takes Furosemide daily  . COPD (chronic obstructive pulmonary disease)   . O2 dependent     2l/m via n/c  . Shortness of breath     lying,sitting,exertion  . SLEEP APNEA, OBSTRUCTIVE     doesn't use CPAP  . Peripheral neuropathy   . Fibromyalgia   . Arthritis   . Joint pain   . Joint swelling   . Chronic constipation     takes OTC  stool softener  . History of colon polyps   . Diverticulitis   . Urinary frequency   . Urinary urgency   . Nocturia   . History of kidney stones   . Anemia     supposed to be taking Vit B but doesn't  . Glaucoma   . DEPRESSION     takes Zoloft and Doxepin daily    Medications:  Prescriptions prior to admission  Medication Sig Dispense Refill  . albuterol (PROVENTIL HFA;VENTOLIN HFA) 108 (90 BASE) MCG/ACT inhaler Inhale 2 puffs into the lungs 4 (four) times daily.      [name] ALPRAZolam (XANAX) 0.5 MG tablet Take 1 mg by mouth at bedtime.      [name] amiodarone (PACERONE) 100 MG tablet Take 1 tablet (100 mg total) by mouth daily.  30 tablet  6  . carisoprodol (SOMA) 350 MG tablet Take 1,050 mg by mouth at bedtime.      . clobetasol cream (TEMOVATE) 2.72 % Apply 1 application topically 2 (two) times daily.      . digoxin (LANOXIN) 0.125 MG tablet  Take 0.125 mg by mouth daily.      [name] doxepin (SINEQUAN) 25 MG capsule Take 25 mg by mouth 2 (two) times daily.      . Fluticasone-Salmeterol (ADVAIR) 250-50 MCG/DOSE AEPB Inhale 1-2 puffs into the lungs daily.      . furosemide (LASIX) 40 MG tablet Take 40 mg by mouth 2 (two) times daily.      . hydrocortisone 2.5 % cream Apply 1 application topically 2 (two) times daily as needed (inflammation).      . hydrOXYzine (ATARAX/VISTARIL) 25 MG tablet Take 1 tablet (25 mg total) by mouth 3 (three) times daily as needed. For itching  30 tablet  1  . lisinopril (PRINIVIL,ZESTRIL) 5 MG tablet Take 1 tablet (5 mg total) by mouth daily.  30 tablet  12  . metFORMIN (GLUCOPHAGE) 500 MG tablet Take 500 mg by mouth 2 (two) times daily with a meal.      . methylPREDNISolone (MEDROL DOSEPAK) 4 MG tablet follow package directions  21 tablet  0  . omeprazole (PRILOSEC) 20 MG capsule Take 20 mg by mouth 2 (two) times daily before a meal.      . oxyCODONE-acetaminophen (PERCOCET) 7.5-325 MG per tablet Take 1 tablet by mouth every 6 (six) hours as needed for pain. No more  than 4 a day  100 tablet  0  . sertraline (ZOLOFT) 100 MG tablet Take 2 tablets (200 mg total) by mouth daily.  180 tablet  3  . tiZANidine (ZANAFLEX) 2 MG tablet Take 2 mg by mouth 3 (three) times daily as needed for muscle spasms.      . traZODone (DESYREL) 100 MG tablet Take 50 mg by mouth at bedtime.      [name] warfarin (COUMADIN) 5 MG tablet Take 2.5-5 mg by mouth daily. Take half a tab (2.5 mg) on M,W,F and all other days take 1 tab (5 mg)      . zolpidem (AMBIEN) 10 MG tablet Take 10 mg by mouth at bedtime.        Assessment: 65 YOM with atrial fibrillation to resume Coumadin s/p R TKA. INR is subtherapeutic today at 1.18 as excepted. CHA2DS2-VASC score is 4. Pt has a history of anemia and H/H prior to surgery was low and plt wnl.   Home Coumadin dose: 2.5 mg Q MWF and 5 mg on all other days   Goal of Therapy:  INR 2-3 Monitor platelets by anticoagulation protocol: Yes   Plan:  Give Coumadin 7.5 mg x 1 dose today. Consider bridging with Lovenox given CHADSVASC score  Daily PT/INR and CBC tomorrow Monitor for s/s of bleeding  Albertina Parr, PharmD.  Clinical Pharmacist Pager [phone]

## 2014-06-15 NOTE — Progress Notes (Signed)
Orthopedic Tech Progress Note Patient Details:  [name] Nov 08, 1955 [id number]  CPM Right Knee CPM Right Knee: On Right Knee Flexion (Degrees): 60 Right Knee Extension (Degrees): 0 Additional Comments: Trpeze bar   Irish Elders 06/15/2014, 12:35 PM

## 2014-06-15 NOTE — Interval H&P Note (Signed)
History and Physical Interval Note:  06/15/2014 7:24 AM  [name]  has presented today for surgery, with the diagnosis of OA RIGHT KNEE  The various methods of treatment have been discussed with the patient and family. After consideration of risks, benefits and other options for treatment, the patient has consented to  Procedure(s): TOTAL KNEE ARTHROPLASTY (Right) as a surgical intervention .  The patient's history has been reviewed, patient examined, no change in status, stable for surgery.  I have reviewed the patient's chart and labs.  Questions were answered to the patient's satisfaction.     Laszlo Ellerby, D

## 2014-06-15 NOTE — Anesthesia Postprocedure Evaluation (Signed)
  Anesthesia Post-op Note  Patient: [name]  Procedure(s) Performed: Procedure(s): TOTAL KNEE ARTHROPLASTY (Right)  Patient Location: PACU  Anesthesia Type:General  Level of Consciousness: awake, alert  and oriented  Airway and Oxygen Therapy: Patient Spontanous Breathing and Patient connected to nasal cannula oxygen  Post-op Pain: moderate  Post-op Assessment: Post-op Vital signs reviewed, Patient's Cardiovascular Status Stable, Respiratory Function Stable, Patent Airway and No signs of Nausea or vomiting  Post-op Vital Signs: Reviewed and stable  Last Vitals:  Filed Vitals:   06/15/14 1135  BP:   Pulse:   Temp: 36.9 C  Resp:     Complications: No apparent anesthesia complications

## 2014-06-15 NOTE — Transfer of Care (Signed)
Immediate Anesthesia Transfer of Care Note  Patient: [name]  Procedure(s) Performed: Procedure(s): TOTAL KNEE ARTHROPLASTY (Right)  Patient Location: PACU  Anesthesia Type:GA combined with regional for post-op pain  Level of Consciousness: awake, alert  and oriented  Airway & Oxygen Therapy: Patient Spontanous Breathing and Patient connected to face mask oxygen  Post-op Assessment: Report given to PACU RN, Post -op Vital signs reviewed and stable and Patient moving all extremities X 4  Post vital signs: Reviewed and stable  Complications: No apparent anesthesia complications

## 2014-06-15 NOTE — Evaluation (Signed)
Occupational Therapy Evaluation Patient Details Name: [name] MRN: [id number] DOB: 19-Dec-1955 Today's Date: 06/15/2014    History of Present Illness 58 y.o. male s/p right total knee arthroplasty.   Clinical Impression   Pt s/p above. Pt independent with ADLs, PTA. Feel pt will benefit from acute OT to increase independence prior to d/c.     Follow Up Recommendations  No OT follow up;Supervision - Intermittent    Equipment Recommendations  None recommended by OT    Recommendations for Other Services       Precautions / Restrictions Precautions Precautions: Knee Precaution Comments: educated on no twisting knee Required Braces or Orthoses: Knee Immobilizer - Right Restrictions Weight Bearing Restrictions: Yes RLE Weight Bearing: Weight bearing as tolerated      Mobility Bed Mobility     General bed mobility comments: not assessed  Transfers Overall transfer level: Needs assistance Equipment used: Rolling walker (2 wheeled) Transfers: Sit to/from Stand Sit to Stand: Min assist         General transfer comment: Assist to rise. Cues for technique.         ADL Overall ADL's : Needs assistance/impaired     Grooming: Wash/dry face;Sitting;Set up               Lower Body Dressing: Minimal assistance;Sit to/from stand   Toilet Transfer: Min guard;Ambulation;RW;Comfort height toilet;Grab bars   Toileting- Clothing Manipulation and Hygiene: Minimal assistance;Sit to/from stand       Functional mobility during ADLs: Min guard;Rolling walker General ADL Comments: educated on safety tips for home (rugs, safe shoewear, use of bag on walker, sitting for LB ADLs, pets). Educated on AE for LB ADLs and educated on dressing technique. Explained benefit of trying to reach to right sock as it allows his knee to bend. Explained purpose of knee immobilizer. Pt ambulated to bathroom and sat on toilet for a while trying to urinate. Pt became nauseous.     Vision                      Perception     Praxis      Pertinent Vitals/Pain Pain Assessment: 0-10 Pain Score: 7  Pain Location: right knee Pain Descriptors / Indicators: Aching Pain Intervention(s): Repositioned     Hand Dominance Left   Extremity/Trunk Assessment Upper Extremity Assessment Upper Extremity Assessment: Overall WFL for tasks assessed   Lower Extremity Assessment Lower Extremity Assessment: Defer to PT evaluation RLE Deficits / Details: decreased strength and ROM as expected post op RLE: Unable to fully assess due to pain       Communication Communication Communication: No difficulties   Cognition Arousal/Alertness: Awake/alert Behavior During Therapy: WFL for tasks assessed/performed Overall Cognitive Status: Within Functional Limits for tasks assessed                     General Comments          Shoulder Instructions      Home Living Family/patient expects to be discharged to:: Private residence Living Arrangements: Children Available Help at Discharge: Family;Available 24 hours/day Type of Home: Mobile home Home Access: Stairs to enter;Ramped entrance (suppose to have a ramp put in before he goes home) Entrance Stairs-Number of Steps: 5 Entrance Stairs-Rails: Left Home Layout: One level     Bathroom Shower/Tub: Teacher, early years/pre: Standard     Home Equipment: Bedside commode;Cane - single point;Tub bench  Prior Functioning/Environment Level of Independence: Independent with assistive device(s)        Comments: Used walker and cane for ambulation    OT Diagnosis: Acute pain   OT Problem List: Decreased strength;Decreased range of motion;Decreased activity tolerance;Decreased knowledge of use of DME or AE;Decreased knowledge of precautions;Pain;Obesity   OT Treatment/Interventions: Self-care/ADL training;DME and/or AE instruction;Patient/family education;Balance training;Therapeutic activities     OT Goals(Current goals can be found in the care plan section) Acute Rehab OT Goals Patient Stated Goal: not stated OT Goal Formulation: With patient Time For Goal Achievement: 06/22/14 Potential to Achieve Goals: Good ADL Goals Pt Will Perform Lower Body Dressing: with modified independence;sit to/from stand Pt Will Transfer to Toilet: with modified independence;ambulating (3 in 1 over commode) Pt Will Perform Toileting - Clothing Manipulation and hygiene: with modified independence;sit to/from stand  OT Frequency: Min 2X/week   Barriers to D/C:            Co-evaluation              End of Session Equipment Utilized During Treatment: Gait belt;Rolling walker;Right knee immobilizer CPM Right Knee CPM Right Knee: Off Nurse Communication: Other (comment) (nauseous/ did not feel well)  Activity Tolerance: Other (comment) (became nauseous/pale) Patient left: in chair;with call bell/phone within reach   Time: 1856-3149 OT Time Calculation (min): 40 min (increased time spent on toilet) Charges:  OT General Charges $OT Visit: 1 Procedure OT Evaluation $Initial OT Evaluation Tier I: 1 Procedure OT Treatments $Self Care/Home Management : 8-22 mins G-CodesBenito Mccreedy OTR/L [phone] 06/15/2014, 5:58 PM

## 2014-06-15 NOTE — Evaluation (Signed)
Physical Therapy Evaluation Patient Details Name: [name] MRN: [id number] DOB: 1956-01-22 Today's Date: 06/15/2014   History of Present Illness  58 y.o. male s/p right total knee arthroplasty.  Clinical Impression  Pt is s/p right TKA, presenting with the deficits listed below (see PT Problem List). Mod assist to stand from bed, post op day #0, but able to ambulate at min guard assist up to 20 feet. Pt will benefit from skilled PT to increase their independence and safety with mobility to allow discharge to the venue listed below.     Follow Up Recommendations Home health PT;Supervision for mobility/OOB    Equipment Recommendations  Other (comment) (Pt states bariatric walker and hospital bed already ordered?)    Recommendations for Other Services OT consult     Precautions / Restrictions Precautions Precautions: Knee Precaution Comments: Reviewed knee precautions Required Braces or Orthoses: Knee Immobilizer - Right Restrictions Weight Bearing Restrictions: Yes RLE Weight Bearing: Weight bearing as tolerated      Mobility  Bed Mobility Overal bed mobility: Needs Assistance Bed Mobility: Supine to Sit     Supine to sit: Min assist;HOB elevated     General bed mobility comments: Min assist for RLE support out of bed. Heavy use of rail and frequent VC for technique. Requires extra time.  Transfers Overall transfer level: Needs assistance Equipment used: Rolling walker (2 wheeled) Transfers: Sit to/from Stand Sit to Stand: Mod assist         General transfer comment: Mod assist for boost from lowest bed setting. VC for hand placement and to block walker. Decreased use of RLE to assist with stand.  Ambulation/Gait Ambulation/Gait assistance: Min guard Ambulation Distance (Feet): 20 Feet Assistive device: Rolling walker (2 wheeled) Gait Pattern/deviations: Step-to pattern;Decreased step length - left;Decreased stance time - right;Antalgic   Gait velocity  interpretation: Below normal speed for age/gender General Gait Details: Educated on safe DME use with rolling walker. VC for sequencing and walker placement. No buckling noted with knee immobilizer in place.  Stairs            Wheelchair Mobility    Modified Rankin (Stroke Patients Only)       Balance Overall balance assessment: Needs assistance Sitting-balance support: No upper extremity supported;Feet supported Sitting balance-Leahy Scale: Fair     Standing balance support: Single extremity supported Standing balance-Leahy Scale: Poor                               Pertinent Vitals/Pain Pain Assessment: 0-10 Pain Score: 7  Pain Location: right knee Pain Intervention(s): Limited activity within patient's tolerance;Monitored during session;Repositioned    Home Living Family/patient expects to be discharged to:: Private residence Living Arrangements: Children Available Help at Discharge: Family;Available 24 hours/day Type of Home: Mobile home Home Access: Stairs to enter;Ramped entrance ("supposed to have a ramp put in before I go home") Entrance Stairs-Rails: Left Entrance Stairs-Number of Steps: 5 Home Layout: One level Home Equipment: Bedside commode;Shower seat;Cane - single point      Prior Function Level of Independence: Independent with assistive device(s)         Comments: Used walker and cane for ambulation     Hand Dominance   Dominant Hand: Left    Extremity/Trunk Assessment   Upper Extremity Assessment: Defer to OT evaluation           Lower Extremity Assessment: RLE deficits/detail RLE Deficits / Details: decreased strength and ROM  as expected post op       Communication   Communication: No difficulties  Cognition Arousal/Alertness: Awake/alert Behavior During Therapy: WFL for tasks assessed/performed Overall Cognitive Status: Within Functional Limits for tasks assessed                      General  Comments General comments (skin integrity, edema, etc.): Pt reports nausea at end of therapy session. Nurse notified. SpO2 down to 92% on room air after ambulating. 96% on 2L at rest.    Exercises Total Joint Exercises Ankle Circles/Pumps: AROM;Both;10 reps;Supine Quad Sets: AROM;Both;10 reps;Supine      Assessment/Plan    PT Assessment Patient needs continued PT services  PT Diagnosis Difficulty walking;Abnormality of gait;Acute pain   PT Problem List Decreased strength;Decreased range of motion;Decreased activity tolerance;Decreased balance;Decreased mobility;Decreased knowledge of use of DME;Decreased knowledge of precautions;Obesity;Pain  PT Treatment Interventions DME instruction;Gait training;Stair training;Functional mobility training;Therapeutic activities;Therapeutic exercise;Balance training;Neuromuscular re-education;Patient/family education;Modalities   PT Goals (Current goals can be found in the Care Plan section) Acute Rehab PT Goals Patient Stated Goal: go home PT Goal Formulation: With patient Time For Goal Achievement: 06/22/14 Potential to Achieve Goals: Good    Frequency 7X/week   Barriers to discharge        Co-evaluation               End of Session   Activity Tolerance: Patient tolerated treatment well Patient left: in chair;with call bell/phone within reach Nurse Communication: Mobility status;Other (comment) (Nausea)         Time: 3329-5188 (-5 min for blood drawn) PT Time Calculation (min): 32 min   Charges:   PT Evaluation $Initial PT Evaluation Tier I: 1 Procedure PT Treatments $Therapeutic Activity: 8-22 mins   PT G Codes:         Elayne Snare, [place]  Ellouise Newer 06/15/2014, 3:41 PM

## 2014-06-15 NOTE — Progress Notes (Signed)
Utilization review completed.  

## 2014-06-15 NOTE — Op Note (Signed)
DATE OF SURGERY:  06/15/2014 TIME: 9:24 AM  PATIENT NAME:  [name]   AGE: 58 y.o.    PRE-OPERATIVE DIAGNOSIS:  OA RIGHT KNEE  POST-OPERATIVE DIAGNOSIS:  Same  PROCEDURE:  Procedure(s): TOTAL KNEE ARTHROPLASTY   SURGEON:  Twan Harkin, D, MD   ASSISTANT: none   OPERATIVE IMPLANTS: [employer] Triathlon Posterior Stabilized.  Femur size 5, Tibia size 5, Patella size 32 3-peg oval button, with a 9 mm polyethylene insert.   PREOPERATIVE INDICATIONS:  [name] is a 58 y.o. year old male with end stage bone on bone degenerative arthritis of the knee who failed conservative treatment, including injections, antiinflammatories, activity modification, and assistive devices, and had significant impairment of their activities of daily living, and elected for Total Knee Arthroplasty.   The risks, benefits, and alternatives were discussed at length including but not limited to the risks of infection, bleeding, nerve injury, stiffness, blood clots, the need for revision surgery, cardiopulmonary complications, among others, and they were willing to proceed.   OPERATIVE DESCRIPTION:  The patient was brought to the operative room and placed in a supine position.  General anesthesia was administered.  IV antibiotics were given.  The lower extremity was prepped and draped in the usual sterile fashion.  Time out was performed.  The leg was elevated and exsanguinated and the tourniquet was inflated.  Anterior approach was performed.  The patella was everted and osteophytes were removed.  The anterior horn of the medial and lateral meniscus was removed.   The distal femur was opened with the drill and the intramedullary distal femoral cutting jig was utilized, set at 5 degrees resecting 8 mm off the distal femur.  Care was taken to protect the collateral ligaments.  The distal femoral sizing jig was applied, taking care to avoid notching.  Then the 4-in-1 cutting jig was applied and the  anterior and posterior femur was cut, along with the chamfer cuts.  All posterior osteophytes were removed.  The flexion gap was then measured and was symmetric with the extension gap.  Then the extramedullary tibial cutting jig was utilized making the appropriate cut using the anterior tibial crest as a reference building in appropriate posterior slope.  Care was taken during the cut to protect the medial and collateral ligaments.  The proximal tibia was removed along with the posterior horns of the menisci.  The PCL was sacrificed.    The extensor gap was measured and was approximately 23mm.    I completed the distal femoral preparation using the appropriate jig to prepare the box.  The patella was then measured, and cut with the saw.    The proximal tibia sized and prepared accordingly with the reamer and the punch, and then all components were trialed with the 39mm poly insert.  The knee was found to have excellent balance and full motion.    The above named components were then cemented into place and all excess cement was removed.  The real polyethylene implant was placed.  Exparel was injected in the soft tissue  The knee was easily taken through a range of motion and the patella tracked well and the knee irrigated copiously and the parapatellar and subcutaneous tissue closed with vicryl, and monocryl with steri strips for the skin.  The wounds were injected with exparell, and dressed with sterile gauze and the tourniquet released and the patient was awakened and returned to the PACU in stable and satisfactory condition.  There were no complications.  Total tourniquet time was 75 minutes.   POSTOPERATIVE PLAN: post op Abx, DVT px: SCD's, TED's, Early ambulation and ASA 325 daily

## 2014-06-16 ENCOUNTER — Ambulatory Visit: Payer: Commercial Managed Care - HMO | Admitting: Physical Medicine & Rehabilitation

## 2014-06-16 LAB — GLUCOSE, CAPILLARY
Glucose-Capillary: 147 mg/dL — ABNORMAL HIGH (ref 70–99)
Glucose-Capillary: 137 mg/dL — ABNORMAL HIGH (ref 70–99)
Glucose-Capillary: 153 mg/dL — ABNORMAL HIGH (ref 70–99)
Glucose-Capillary: 110 mg/dL — ABNORMAL HIGH (ref 70–99)

## 2014-06-16 LAB — PROTIME-INR
INR: 1.14 (ref 0.00–1.49)
PROTHROMBIN TIME: 14.6 s (ref 11.6–15.2)

## 2014-06-16 MED ORDER — MAGNESIUM HYDROXIDE 400 MG/5ML PO SUSP
30.0000 mL | Freq: Every day | ORAL | Status: DC | PRN
Start: 2014-06-16 — End: 2014-06-17
  Administered 2014-06-16: 30 mL via ORAL
  Filled 2014-06-16: qty 30

## 2014-06-16 MED ORDER — OXYCODONE-ACETAMINOPHEN 5-325 MG PO TABS: 2.0000 | ORAL_TABLET | ORAL | Status: DC | PRN

## 2014-06-16 MED ORDER — WARFARIN SODIUM 7.5 MG PO TABS
7.5000 mg | ORAL_TABLET | Freq: Once | ORAL | Status: AC
  Administered 2014-06-16: 7.5 mg via ORAL
  Filled 2014-06-16: qty 1

## 2014-06-16 MED ORDER — CELECOXIB 200 MG PO CAPS: 200.0000 mg | ORAL_CAPSULE | Freq: Two times a day (BID) | ORAL | Status: DC

## 2014-06-16 MED ORDER — ALPRAZOLAM 0.5 MG PO TABS
1.0000 mg | ORAL_TABLET | Freq: Two times a day (BID) | ORAL | Status: DC | PRN
  Administered 2014-06-16: 1 mg via ORAL
  Filled 2014-06-16: qty 2

## 2014-06-16 MED ORDER — ONDANSETRON HCL 4 MG PO TABS: 4.0000 mg | ORAL_TABLET | Freq: Three times a day (TID) | ORAL | Status: DC | PRN

## 2014-06-16 NOTE — Progress Notes (Signed)
Physical Therapy Treatment Patient Details Name: [name] MRN: [id number] DOB: 05/18/1956 Today's Date: 06/16/2014    History of Present Illness 58 y.o. male s/p right total knee arthroplasty.    PT Comments    Continues to progress nicely towards physical therapy goals, ambulating up to 100 feet with rolling walker and does not require physical assist with mobility tasks assessed today. All education has been reviewed and feel patient is adequate for d/c from a mobility standpoint when medically ready. Will continue to work with patient, focusing on gait training and safety with mobility until d/c. Patient will continue to benefit from skilled physical therapy services at home with HHPT to further improve independence with functional mobility.   Follow Up Recommendations  Home health PT;Supervision for mobility/OOB     Equipment Recommendations  Other (comment) (Pt states bariatric walker and hospital bed already ordered?)    Recommendations for Other Services OT consult     Precautions / Restrictions Precautions Precautions: Knee Required Braces or Orthoses: Knee Immobilizer - Right Restrictions Weight Bearing Restrictions: Yes RLE Weight Bearing: Weight bearing as tolerated    Mobility  Bed Mobility Overal bed mobility: Needs Assistance Bed Mobility: Supine to Sit;Sit to Supine     Supine to sit: Supervision Sit to supine: Supervision   General bed mobility comments: Supervision for safety. VC for technique with education for use of LLE to support RLE. No physical assist needed, requires extra time.  Transfers Overall transfer level: Needs assistance Equipment used: Rolling walker (2 wheeled) Transfers: Sit to/from Stand Sit to Stand: Min guard         General transfer comment: Min guard for safety with VC for hand placement. Performed from recliner and lowest bed setting. Took several attempts to stand from low bed however was able to complete with VC for  technique and did not require physical assist.  Ambulation/Gait Ambulation/Gait assistance: Supervision Ambulation Distance (Feet): 100 Feet Assistive device: Rolling walker (2 wheeled) Gait Pattern/deviations: Step-to pattern;Step-through pattern;Decreased step length - left;Decreased stance time - right;Antalgic   Gait velocity interpretation: Below normal speed for age/gender General Gait Details: Gait training focused on step through gait pattern with VC for sequencing. Encouraged to use less support through UEs. VC for knee extension in right stance phase for quad activation. No buckling noted during bout. Continues to move slowly and cautiously.   Stairs            Wheelchair Mobility    Modified Rankin (Stroke Patients Only)       Balance                                    Cognition Arousal/Alertness: Awake/alert Behavior During Therapy: WFL for tasks assessed/performed Overall Cognitive Status: Within Functional Limits for tasks assessed                      Exercises Total Joint Exercises Heel Slides: AAROM;Right;10 reps;Supine    General Comments        Pertinent Vitals/Pain Pain Assessment: 0-10 Pain Score:  ("I'm doing pretty good" no value given.) Pain Location: right knee Pain Intervention(s): Limited activity within patient's tolerance;Monitored during session;Repositioned    Home Living                      Prior Function            PT Goals (  current goals can now be found in the care plan section) Acute Rehab PT Goals PT Goal Formulation: With patient Time For Goal Achievement: 06/22/14 Potential to Achieve Goals: Good Progress towards PT goals: Progressing toward goals    Frequency  7X/week    PT Plan Current plan remains appropriate    Co-evaluation             End of Session   Activity Tolerance: Patient tolerated treatment well Patient left: in chair;with call bell/phone within reach      Time: 1405-1431 PT Time Calculation (min): 26 min  Charges:  $Gait Training: 8-22 mins $Therapeutic Activity: 8-22 mins                    G Codes:      [employer], [place]  Ellouise Newer 06/16/2014, 3:18 PM

## 2014-06-16 NOTE — Progress Notes (Signed)
Physical Therapy Treatment Patient Details Name: [name] MRN: [id number] DOB: 03-07-1956 Today's Date: 06/16/2014    History of Present Illness 58 y.o. male s/p right total knee arthroplasty.    PT Comments    Patient progressing well towards physical therapy goals. Reports that ramp was not built at home as previously anticipated and therefore practiced and safely completed stair training this AM. Ambulating with supervision and no instances of buckling or loss of balance. Reports he will have 24 hour care at home and feel patient is adequate for d/c from PT standpoint when medically ready.  Follow Up Recommendations  Home health PT;Supervision for mobility/OOB     Equipment Recommendations  Other (comment) (Pt states bariatric walker and hospital bed already ordered?)    Recommendations for Other Services OT consult     Precautions / Restrictions Precautions Precautions: Knee Required Braces or Orthoses: Knee Immobilizer - Right Restrictions Weight Bearing Restrictions: Yes RLE Weight Bearing: Weight bearing as tolerated    Mobility  Bed Mobility                  Transfers Overall transfer level: Needs assistance Equipment used: Rolling walker (2 wheeled) Transfers: Sit to/from Stand Sit to Stand: Min guard         General transfer comment: Min guard for safety. VC for technique and hand placement. Performed from recliner x2. Cues for safe descent into chair.  Ambulation/Gait Ambulation/Gait assistance: Min guard Ambulation Distance (Feet): 50 Feet Assistive device: Rolling walker (2 wheeled) Gait Pattern/deviations: Step-to pattern;Decreased step length - left;Decreased stance time - right;Antalgic   Gait velocity interpretation: Below normal speed for age/gender General Gait Details: VC for right knee extension in stance phase for quad activation. No buckling without knee immobilizer in place. Very slow and guarded gait.   Stairs Stairs:  Yes Stairs assistance: Min guard Stair Management: One rail Left;Sideways;Step to pattern Number of Stairs: 2 (x2) General stair comments: Demonstrated to patient prior to having him practice safe stair mobility. Performs this task adequately without buckling while holding onto single rail similar to home environtment. States he feels confident with this task.  Wheelchair Mobility    Modified Rankin (Stroke Patients Only)       Balance                                    Cognition Arousal/Alertness: Awake/alert Behavior During Therapy: WFL for tasks assessed/performed Overall Cognitive Status: Within Functional Limits for tasks assessed                      Exercises Total Joint Exercises Ankle Circles/Pumps: AROM;Both;10 reps;Seated Quad Sets: AROM;Both;10 reps;Seated Long Arc Quad: Right;10 reps;Seated;AROM Knee Flexion: AROM;Right;10 reps;Seated Goniometric ROM: 14-76 degrees right knee flexion in seated position.    General Comments General comments (skin integrity, edema, etc.): SpO2 91-92% on room air after ambulating. Up to 96% with 2L supplemental O2 reapplied.      Pertinent Vitals/Pain Pain Assessment: 0-10 Pain Score: 9  Pain Location: right knee Pain Intervention(s): Limited activity within patient's tolerance;Monitored during session;Repositioned;Patient requesting pain meds-RN notified    Home Living                      Prior Function            PT Goals (current goals can now be found in the care plan section)  Acute Rehab PT Goals PT Goal Formulation: With patient Time For Goal Achievement: 06/22/14 Potential to Achieve Goals: Good Progress towards PT goals: Progressing toward goals    Frequency  7X/week    PT Plan Current plan remains appropriate    Co-evaluation             End of Session   Activity Tolerance: Patient tolerated treatment well Patient left: in chair;with call bell/phone within reach      Time: 9518-8416 PT Time Calculation (min): 42 min  Charges:  $Gait Training: 8-22 mins $Therapeutic Exercise: 8-22 mins $Therapeutic Activity: 8-22 mins                    G Codes:      [employer], [place]  Ellouise Newer 06/16/2014, 10:19 AM

## 2014-06-16 NOTE — Progress Notes (Signed)
    Subjective:  Patient reports pain as mild  Objective:   VITALS:   Filed Vitals:   06/15/14 2200 06/16/14 0135 06/16/14 0541 06/16/14 0840  BP: 116/46 110/49 126/50   Pulse: 75 72 73   Temp: 98 F (36.7 C) 98.1 F (36.7 C) 98.2 F (36.8 C)   TempSrc:      Resp: 18 18 16    Weight:      SpO2: 94% 96% 99% 98%    Physical Exam  Dressing: C/D/I  Compartments soft  SILT DP/SP/S/S/T, 2+DP, +TA/GS/EHL  LABS  Results for orders placed during the hospital encounter of 06/15/14 (from the past 24 hour(s))  GLUCOSE, CAPILLARY     Status: Abnormal   Collection Time    06/15/14 10:18 AM      Result Value Ref Range   Glucose-Capillary 161 (*) 70 - 99 mg/dL   Comment 1 Notify RN    CBC     Status: Abnormal   Collection Time    06/15/14  2:40 PM      Result Value Ref Range   WBC 14.0 (*) 4.0 - 10.5 K/uL   RBC 3.31 (*) 4.22 - 5.81 MIL/uL   Hemoglobin 8.7 (*) 13.0 - 17.0 g/dL   HCT 26.6 (*) 39.0 - 52.0 %   MCV 80.4  78.0 - 100.0 fL   MCH 26.3  26.0 - 34.0 pg   MCHC 32.7  30.0 - 36.0 g/dL   RDW 14.5  11.5 - 15.5 %   Platelets 315  150 - 400 K/uL  GLUCOSE, CAPILLARY     Status: Abnormal   Collection Time    06/15/14  4:40 PM      Result Value Ref Range   Glucose-Capillary 210 (*) 70 - 99 mg/dL   Comment 1 Notify RN     Comment 2 Documented in Chart    GLUCOSE, CAPILLARY     Status: Abnormal   Collection Time    06/15/14 11:42 PM      Result Value Ref Range   Glucose-Capillary 134 (*) 70 - 99 mg/dL  PROTIME-INR     Status: None   Collection Time    06/16/14  6:00 AM      Result Value Ref Range   Prothrombin Time 14.6  11.6 - 15.2 seconds   INR 1.14  0.00 - 1.49  GLUCOSE, CAPILLARY     Status: Abnormal   Collection Time    06/16/14  6:34 AM      Result Value Ref Range   Glucose-Capillary 147 (*) 70 - 99 mg/dL     Assessment/Plan: 1 Day Post-Op   Active Problems:   Arthritis of knee   PLAN: Weight Bearing: WBAT Dressings: keep C/D/I, may remove ace wrap  prior to d/c but leave small bandage in place until f/u VTE prophylaxis: Coumadin home dosage.  Dispo: likely home tomorrow   Edmonia Lynch, D 06/16/2014, 8:45 AM   Edmonia Lynch, MD Cell [phone]

## 2014-06-16 NOTE — Progress Notes (Signed)
ANTICOAGULATION CONSULT NOTE - Initial Consult  Pharmacy Consult for Coumadin  Indication: atrial fibrillation & s/p R TKA   Allergies  Allergen Reactions  . Simvastatin     REACTION: severe leg cramps  . Statins Other (See Comments)    myalgia    Patient Measurements: Weight: 323 lb (146.512 kg)  Vital Signs: Temp: 98.2 F (36.8 C) (09/02 0541) BP: 126/50 mmHg (09/02 0541) Pulse Rate: 73 (09/02 0541)  Labs:  Recent Labs  06/15/14 0640 06/15/14 1440 06/16/14 0600  HGB  --  8.7*  --   HCT  --  26.6*  --   PLT  --  315  --   APTT 24  --   --   LABPROT 15.0  --  14.6  INR 1.18  --  1.14    The CrCl is unknown because both a height and weight (above a minimum accepted value) are required for this calculation.   Medical History: Past Medical History  Diagnosis Date  . Chronic pain syndrome   . DYSKINESIA, ESOPHAGUS   . FIBROMYALGIA   . HYPERLIPIDEMIA     but unable to take any meds  . INSOMNIA     takes Ambien nightly  . LOW BACK PAIN     DDD and Herniated disc  . ORCHITIS   . Rash and other nonspecific skin eruption 04/12/2009  . Atrial fibrillation     takes Coumadin daily along with Pacerone  . Cardiomyopathy     Myoview with prior infarct- 2012; EF 25% TEE July 2013  . H/O hiatal hernia   . Myocardial infarction     hx of MI  . ANXIETY     takes Xanax nightly  . Muscle spasm     takes Zanaflex daily as needed  . ASTHMA     Albuterol prn and Advair daily;also takes Prednisone daily  . GERD     takes Omeprazole daily  . DIABETES MELLITUS, TYPE II     takes Metformin daily  . HYPERTENSION     takes Lisinopril daily  . Peripheral edema     takes Lasix daily  . CHF (congestive heart failure)     takes Furosemide daily  . COPD (chronic obstructive pulmonary disease)   . O2 dependent     2l/m via n/c  . Shortness of breath     lying,sitting,exertion  . SLEEP APNEA, OBSTRUCTIVE     doesn't use CPAP  . Peripheral neuropathy   . Fibromyalgia    . Arthritis   . Joint pain   . Joint swelling   . Chronic constipation     takes OTC stool softener  . History of colon polyps   . Diverticulitis   . Urinary frequency   . Urinary urgency   . Nocturia   . History of kidney stones   . Anemia     supposed to be taking Vit B but doesn't  . Glaucoma   . DEPRESSION     takes Zoloft and Doxepin daily    Medications:  Prescriptions prior to admission  Medication Sig Dispense Refill  . albuterol (PROVENTIL HFA;VENTOLIN HFA) 108 (90 BASE) MCG/ACT inhaler Inhale 2 puffs into the lungs 4 (four) times daily.      [name] ALPRAZolam (XANAX) 0.5 MG tablet Take 1 mg by mouth at bedtime.      [name] amiodarone (PACERONE) 100 MG tablet Take 1 tablet (100 mg total) by mouth daily.  30 tablet  6  . carisoprodol (SOMA)  350 MG tablet Take 1,050 mg by mouth at bedtime.      . clobetasol cream (TEMOVATE) 3.71 % Apply 1 application topically 2 (two) times daily.      . digoxin (LANOXIN) 0.125 MG tablet Take 0.125 mg by mouth daily.      [name] doxepin (SINEQUAN) 25 MG capsule Take 25 mg by mouth 2 (two) times daily.      . Fluticasone-Salmeterol (ADVAIR) 250-50 MCG/DOSE AEPB Inhale 1-2 puffs into the lungs daily.      . furosemide (LASIX) 40 MG tablet Take 40 mg by mouth 2 (two) times daily.      . hydrocortisone 2.5 % cream Apply 1 application topically 2 (two) times daily as needed (inflammation).      . hydrOXYzine (ATARAX/VISTARIL) 25 MG tablet Take 1 tablet (25 mg total) by mouth 3 (three) times daily as needed. For itching  30 tablet  1  . lisinopril (PRINIVIL,ZESTRIL) 5 MG tablet Take 1 tablet (5 mg total) by mouth daily.  30 tablet  12  . metFORMIN (GLUCOPHAGE) 500 MG tablet Take 500 mg by mouth 2 (two) times daily with a meal.      . methylPREDNISolone (MEDROL DOSEPAK) 4 MG tablet follow package directions  21 tablet  0  . omeprazole (PRILOSEC) 20 MG capsule Take 20 mg by mouth 2 (two) times daily before a meal.      . oxyCODONE-acetaminophen (PERCOCET)  7.5-325 MG per tablet Take 1 tablet by mouth every 6 (six) hours as needed for pain. No more than 4 a day  100 tablet  0  . sertraline (ZOLOFT) 100 MG tablet Take 2 tablets (200 mg total) by mouth daily.  180 tablet  3  . tiZANidine (ZANAFLEX) 2 MG tablet Take 2 mg by mouth 3 (three) times daily as needed for muscle spasms.      . traZODone (DESYREL) 100 MG tablet Take 50 mg by mouth at bedtime.      [name] warfarin (COUMADIN) 5 MG tablet Take 2.5-5 mg by mouth daily. Take half a tab (2.5 mg) on M,W,F and all other days take 1 tab (5 mg)      . zolpidem (AMBIEN) 10 MG tablet Take 10 mg by mouth at bedtime.        Assessment: 17 YOM with atrial fibrillation to resume Coumadin s/p R TKA. INR remains subtherapeutic today at 1.14 as expected since coumadin just resumed yesterday.  CHA2DS2-VASC score is 4. Pt has a history of anemia and H/H prior to surgery was low and plt wnl.  No CBC done today. No bleeding reported.  Home Coumadin dose: 2.5 mg Q MWF and 5 mg on all other days   Goal of Therapy:  INR 2-3 Monitor platelets by anticoagulation protocol: Yes   Plan:  Give Coumadin 7.5 mg x 1 dose today. Consider bridging with Lovenox given CHADSVASC score  Daily PT/INR and CBC tomorrow Monitor for s/s of bleeding  Nicole Cella, RPh Clinical Pharmacist Pager: [phone]

## 2014-06-16 NOTE — Discharge Summary (Signed)
Physician Discharge Summary  Patient ID: [name] MRN: [id number] DOB/AGE: [birth year] 59 y.o.  Admit date: 06/15/2014 Discharge date: 06/16/2014  Admission Diagnoses:  <principal problem not specified>  Discharge Diagnoses:  Active Problems:   Arthritis of knee   Past Medical History  Diagnosis Date  . Chronic pain syndrome   . DYSKINESIA, ESOPHAGUS   . FIBROMYALGIA   . HYPERLIPIDEMIA     but unable to take any meds  . INSOMNIA     takes Ambien nightly  . LOW BACK PAIN     DDD and Herniated disc  . ORCHITIS   . Rash and other nonspecific skin eruption 04/12/2009  . Atrial fibrillation     takes Coumadin daily along with Pacerone  . Cardiomyopathy     Myoview with prior infarct- 2012; EF 25% TEE July 2013  . H/O hiatal hernia   . Myocardial infarction     hx of MI  . ANXIETY     takes Xanax nightly  . Muscle spasm     takes Zanaflex daily as needed  . ASTHMA     Albuterol prn and Advair daily;also takes Prednisone daily  . GERD     takes Omeprazole daily  . DIABETES MELLITUS, TYPE II     takes Metformin daily  . HYPERTENSION     takes Lisinopril daily  . Peripheral edema     takes Lasix daily  . CHF (congestive heart failure)     takes Furosemide daily  . COPD (chronic obstructive pulmonary disease)   . O2 dependent     2l/m via n/c  . Shortness of breath     lying,sitting,exertion  . SLEEP APNEA, OBSTRUCTIVE     doesn't use CPAP  . Peripheral neuropathy   . Fibromyalgia   . Arthritis   . Joint pain   . Joint swelling   . Chronic constipation     takes OTC stool softener  . History of colon polyps   . Diverticulitis   . Urinary frequency   . Urinary urgency   . Nocturia   . History of kidney stones   . Anemia     supposed to be taking Vit B but doesn't  . Glaucoma   . DEPRESSION     takes Zoloft and Doxepin daily    Surgeries: Procedure(s): TOTAL KNEE ARTHROPLASTY on 06/15/2014   Consultants (if any):    Discharged Condition:  Improved  Hospital Course: [name] is an 58 y.o. male who was admitted 06/15/2014 with a diagnosis of <principal problem not specified> and went to the operating room on 06/15/2014 and underwent the above named procedures.    He was given perioperative antibiotics:  Anti-infectives   Start     Dose/Rate Route Frequency Ordered Stop   06/15/14 1400  ceFAZolin (ANCEF) IVPB 2 g/50 mL premix     2 g 100 mL/hr over 30 Minutes Intravenous Every 6 hours 06/15/14 1158 06/15/14 2359   06/15/14 0600  ceFAZolin (ANCEF) 3 g in dextrose 5 % 50 mL IVPB     3 g 160 mL/hr over 30 Minutes Intravenous On call to O.R. 06/14/14 1439 06/15/14 0755    .  He was given sequential compression devices, early ambulation, and Coumadin home dosing for DVT prophylaxis.  He benefited maximally from the hospital stay and there were no complications.    Recent vital signs:  Filed Vitals:   06/16/14 0541  BP: 126/50  Pulse: 73  Temp: 98.2 F (36.8  C)  Resp: 16    Recent laboratory studies:  Lab Results  Component Value Date   HGB 8.7* 06/15/2014   HGB 9.4* 06/07/2014   HGB 11.4* 02/16/2014   Lab Results  Component Value Date   WBC 14.0* 06/15/2014   PLT 315 06/15/2014   Lab Results  Component Value Date   INR 1.14 06/16/2014   Lab Results  Component Value Date   NA 135* 06/07/2014   K 4.5 06/07/2014   CL 98 06/07/2014   CO2 24 06/07/2014   BUN 19 06/07/2014   CREATININE 0.94 06/07/2014   GLUCOSE 87 06/07/2014    Discharge Medications:     Medication List    STOP taking these medications       oxyCODONE-acetaminophen 7.5-325 MG per tablet  Commonly known as:  PERCOCET  Replaced by:  oxyCODONE-acetaminophen 5-325 MG per tablet      TAKE these medications       albuterol 108 (90 BASE) MCG/ACT inhaler  Commonly known as:  PROVENTIL HFA;VENTOLIN HFA  Inhale 2 puffs into the lungs 4 (four) times daily.     ALPRAZolam 0.5 MG tablet  Commonly known as:  XANAX  Take 1 mg by mouth at bedtime.      amiodarone 100 MG tablet  Commonly known as:  PACERONE  Take 1 tablet (100 mg total) by mouth daily.     carisoprodol 350 MG tablet  Commonly known as:  SOMA  Take 1,050 mg by mouth at bedtime.     celecoxib 200 MG capsule  Commonly known as:  CELEBREX  Take 1 capsule (200 mg total) by mouth 2 (two) times daily.     clobetasol cream 0.05 %  Commonly known as:  TEMOVATE  Apply 1 application topically 2 (two) times daily.     digoxin 0.125 MG tablet  Commonly known as:  LANOXIN  Take 0.125 mg by mouth daily.     doxepin 25 MG capsule  Commonly known as:  SINEQUAN  Take 25 mg by mouth 2 (two) times daily.     Fluticasone-Salmeterol 250-50 MCG/DOSE Aepb  Commonly known as:  ADVAIR  Inhale 1-2 puffs into the lungs daily.     furosemide 40 MG tablet  Commonly known as:  LASIX  Take 40 mg by mouth 2 (two) times daily.     hydrocortisone 2.5 % cream  Apply 1 application topically 2 (two) times daily as needed (inflammation).     hydrOXYzine 25 MG tablet  Commonly known as:  ATARAX/VISTARIL  Take 1 tablet (25 mg total) by mouth 3 (three) times daily as needed. For itching     metFORMIN 500 MG tablet  Commonly known as:  GLUCOPHAGE  Take 500 mg by mouth 2 (two) times daily with a meal.     methylPREDNISolone 4 MG tablet  Commonly known as:  MEDROL DOSEPAK  follow package directions     omeprazole 20 MG capsule  Commonly known as:  PRILOSEC  Take 20 mg by mouth 2 (two) times daily before a meal.     ondansetron 4 MG tablet  Commonly known as:  ZOFRAN  Take 1 tablet (4 mg total) by mouth every 8 (eight) hours as needed for nausea.     oxyCODONE-acetaminophen 5-325 MG per tablet  Commonly known as:  ROXICET  Take 2 tablets by mouth every 4 (four) hours as needed.     sertraline 100 MG tablet  Commonly known as:  ZOLOFT  Take 2 tablets (200  mg total) by mouth daily.     tiZANidine 2 MG tablet  Commonly known as:  ZANAFLEX  Take 2 mg by mouth 3 (three) times daily as  needed for muscle spasms.     traZODone 100 MG tablet  Commonly known as:  DESYREL  Take 50 mg by mouth at bedtime.     warfarin 5 MG tablet  Commonly known as:  COUMADIN  Take 2.5-5 mg by mouth daily. Take half a tab (2.5 mg) on M,W,F and all other days take 1 tab (5 mg)     zolpidem 10 MG tablet  Commonly known as:  AMBIEN  Take 10 mg by mouth at bedtime.      ASK your doctor about these medications       lisinopril 5 MG tablet  Commonly known as:  PRINIVIL,ZESTRIL  Take 1 tablet (5 mg total) by mouth daily.        Diagnostic Studies: Dg Knee 1-2 Views Right  06/15/2014   CLINICAL DATA:  Postop total knee arthroplasty.  EXAM: RIGHT KNEE - 1-2 VIEW  COMPARISON:  None.  FINDINGS: Sequelae of right total knee arthroplasty are identified. The prosthetic components are approximated. Postoperative gas is noted in the overlying soft tissues. No fracture is seen. Scattered atherosclerotic vascular calcification is noted.  IMPRESSION: Postoperative changes from right total knee arthroplasty.   Electronically Signed   By: Logan Bores   On: 06/15/2014 10:42    Disposition: 01-Home or Self Care      Discharge Instructions   Weight bearing as tolerated    Complete by:  As directed               Signed: MURPHY, TIMOTHY, D 06/16/2014, 8:25 AM

## 2014-06-17 ENCOUNTER — Encounter (HOSPITAL_COMMUNITY): Payer: Self-pay | Admitting: Orthopedic Surgery

## 2014-06-17 LAB — GLUCOSE, CAPILLARY
GLUCOSE-CAPILLARY: 106 mg/dL — AB (ref 70–99)
Glucose-Capillary: 100 mg/dL — ABNORMAL HIGH (ref 70–99)

## 2014-06-17 LAB — PROTIME-INR
INR: 1.36 (ref 0.00–1.49)
PROTHROMBIN TIME: 16.8 s — AB (ref 11.6–15.2)

## 2014-06-17 NOTE — Progress Notes (Signed)
I participated in the care of this patient and agree with the above history, physical and evaluation. I performed a review of the history and a physical exam as detailed   Cozy Veale Daniel Consuelo Thayne MD  

## 2014-06-17 NOTE — Care Management Note (Signed)
CARE MANAGEMENT NOTE 06/17/2014  Patient:  [name], [name]   Account Number:  [id number]  Date Initiated:  06/17/2014  Documentation initiated by:  Ricki Miller  Subjective/Objective Assessment:   58 yr old male admitted with osteoarthritis of right knee, S/P right total knee arthroplasty.     Action/Plan:   Case manager spoke with patient concerning home health and DME needs at discharge. Choice offered. Referral called to [facility], [facility] liaison.   Anticipated DC Date:  06/17/2014   Anticipated DC Plan:  [place]  CM consult      [facility]   Choice offered to / List presented to:  C-1 Patient   DME arranged  [facility]  3-N-1  [facility]  [facility]      DME agency  [employer]     [facility] arranged  HH-1 RN  HH-2 PT      [facility].   Status of service:  Completed, signed off

## 2014-06-17 NOTE — Progress Notes (Signed)
ANTICOAGULATION CONSULT NOTE - follow up Pharmacy Consult for Coumadin  Indication: atrial fibrillation & s/p R TKA   Allergies  Allergen Reactions  . Simvastatin     REACTION: severe leg cramps  . Statins Other (See Comments)    myalgia    Patient Measurements: Weight: 323 lb (146.512 kg)  Vital Signs: Temp: 98 F (36.7 C) (09/03 0600) BP: 115/51 mmHg (09/03 0600) Pulse Rate: 73 (09/03 1027)  Labs:  Recent Labs  06/15/14 0640 06/15/14 1440 06/16/14 0600 06/17/14 0720  HGB  --  8.7*  --   --   HCT  --  26.6*  --   --   PLT  --  315  --   --   APTT 24  --   --   --   LABPROT 15.0  --  14.6 16.8*  INR 1.18  --  1.14 1.36    The CrCl is unknown because both a height and weight (above a minimum accepted value) are required for this calculation.   Medical History: Past Medical History  Diagnosis Date  . Chronic pain syndrome   . DYSKINESIA, ESOPHAGUS   . FIBROMYALGIA   . HYPERLIPIDEMIA     but unable to take any meds  . INSOMNIA     takes Ambien nightly  . LOW BACK PAIN     DDD and Herniated disc  . ORCHITIS   . Rash and other nonspecific skin eruption 04/12/2009  . Atrial fibrillation     takes Coumadin daily along with Pacerone  . Cardiomyopathy     Myoview with prior infarct- 2012; EF 25% TEE July 2013  . H/O hiatal hernia   . Myocardial infarction     hx of MI  . ANXIETY     takes Xanax nightly  . Muscle spasm     takes Zanaflex daily as needed  . ASTHMA     Albuterol prn and Advair daily;also takes Prednisone daily  . GERD     takes Omeprazole daily  . DIABETES MELLITUS, TYPE II     takes Metformin daily  . HYPERTENSION     takes Lisinopril daily  . Peripheral edema     takes Lasix daily  . CHF (congestive heart failure)     takes Furosemide daily  . COPD (chronic obstructive pulmonary disease)   . O2 dependent     2l/m via n/c  . Shortness of breath     lying,sitting,exertion  . SLEEP APNEA, OBSTRUCTIVE     doesn't use CPAP  .  Peripheral neuropathy   . Fibromyalgia   . Arthritis   . Joint pain   . Joint swelling   . Chronic constipation     takes OTC stool softener  . History of colon polyps   . Diverticulitis   . Urinary frequency   . Urinary urgency   . Nocturia   . History of kidney stones   . Anemia     supposed to be taking Vit B but doesn't  . Glaucoma   . DEPRESSION     takes Zoloft and Doxepin daily    Medications:  Prescriptions prior to admission  Medication Sig Dispense Refill  . albuterol (PROVENTIL HFA;VENTOLIN HFA) 108 (90 BASE) MCG/ACT inhaler Inhale 2 puffs into the lungs 4 (four) times daily.      [name] ALPRAZolam (XANAX) 0.5 MG tablet Take 1 mg by mouth at bedtime.      [name] amiodarone (PACERONE) 100 MG tablet Take 1 tablet (  100 mg total) by mouth daily.  30 tablet  6  . carisoprodol (SOMA) 350 MG tablet Take 1,050 mg by mouth at bedtime.      . clobetasol cream (TEMOVATE) 3.14 % Apply 1 application topically 2 (two) times daily.      . digoxin (LANOXIN) 0.125 MG tablet Take 0.125 mg by mouth daily.      [name] doxepin (SINEQUAN) 25 MG capsule Take 25 mg by mouth 2 (two) times daily.      . Fluticasone-Salmeterol (ADVAIR) 250-50 MCG/DOSE AEPB Inhale 1-2 puffs into the lungs daily.      . furosemide (LASIX) 40 MG tablet Take 40 mg by mouth 2 (two) times daily.      . hydrocortisone 2.5 % cream Apply 1 application topically 2 (two) times daily as needed (inflammation).      . hydrOXYzine (ATARAX/VISTARIL) 25 MG tablet Take 1 tablet (25 mg total) by mouth 3 (three) times daily as needed. For itching  30 tablet  1  . lisinopril (PRINIVIL,ZESTRIL) 5 MG tablet Take 1 tablet (5 mg total) by mouth daily.  30 tablet  12  . lisinopril (PRINIVIL,ZESTRIL) 5 MG tablet Take 5 mg by mouth daily.      . metFORMIN (GLUCOPHAGE) 500 MG tablet Take 500 mg by mouth 2 (two) times daily with a meal.      . methylPREDNISolone (MEDROL DOSEPAK) 4 MG tablet follow package directions  21 tablet  0  . omeprazole (PRILOSEC)  20 MG capsule Take 20 mg by mouth 2 (two) times daily before a meal.      . oxyCODONE-acetaminophen (PERCOCET) 7.5-325 MG per tablet Take 1 tablet by mouth every 6 (six) hours as needed for pain. No more than 4 a day  100 tablet  0  . sertraline (ZOLOFT) 100 MG tablet Take 2 tablets (200 mg total) by mouth daily.  180 tablet  3  . tiZANidine (ZANAFLEX) 2 MG tablet Take 2 mg by mouth 3 (three) times daily as needed for muscle spasms.      . traZODone (DESYREL) 100 MG tablet Take 50 mg by mouth at bedtime.      [name] warfarin (COUMADIN) 5 MG tablet Take 2.5-5 mg by mouth daily. Take half a tab (2.5 mg) on M,W,F and all other days take 1 tab (5 mg)      . zolpidem (AMBIEN) 10 MG tablet Take 10 mg by mouth at bedtime.        Assessment: INR 1.36 today in this 3 YOM with atrial fibrillation on chronic Coumadin prior to admission.  s/p R TKA.  CHA2DS2-VASC score is 4. Pt has a history of anemia and H/H prior to surgery was low and plt wnl.  No CBC done today. No bleeding reported.  Home Coumadin dose: 2.5 mg Q MWF and 5 mg on all other days   Goal of Therapy:  INR 2-3 Monitor platelets by anticoagulation protocol: Yes   Plan:  MD discharging patient today on home dose of coumadin. I reviewed coumadin education with pt on 06/16/14.  Recommended pt take 5mg  coumadin today then resume home dose coumadin. INR per home health RN to report result to his primary MD.  Nicole Cella, [facility] Clinical Pharmacist Pager: [phone]

## 2014-06-17 NOTE — Progress Notes (Signed)
Pt D/C home. D/c instructions were reviewed with pt. All questions were answered. No complain. Pt waiting for his ride.

## 2014-06-17 NOTE — Discharge Summary (Signed)
Physician Discharge Summary  Patient ID: [name] MRN: [id number] DOB/AGE: December 03, 1955 58 y.o.  Admit date: 06/15/2014 Discharge date: 06/17/2014  Admission Diagnoses:  <principal problem not specified>  Discharge Diagnoses:  Active Problems:   Arthritis of knee   Past Medical History  Diagnosis Date  . Chronic pain syndrome   . DYSKINESIA, ESOPHAGUS   . FIBROMYALGIA   . HYPERLIPIDEMIA     but unable to take any meds  . INSOMNIA     takes Ambien nightly  . LOW BACK PAIN     DDD and Herniated disc  . ORCHITIS   . Rash and other nonspecific skin eruption 04/12/2009  . Atrial fibrillation     takes Coumadin daily along with Pacerone  . Cardiomyopathy     Myoview with prior infarct- 2012; EF 25% TEE July 2013  . H/O hiatal hernia   . Myocardial infarction     hx of MI  . ANXIETY     takes Xanax nightly  . Muscle spasm     takes Zanaflex daily as needed  . ASTHMA     Albuterol prn and Advair daily;also takes Prednisone daily  . GERD     takes Omeprazole daily  . DIABETES MELLITUS, TYPE II     takes Metformin daily  . HYPERTENSION     takes Lisinopril daily  . Peripheral edema     takes Lasix daily  . CHF (congestive heart failure)     takes Furosemide daily  . COPD (chronic obstructive pulmonary disease)   . O2 dependent     2l/m via n/c  . Shortness of breath     lying,sitting,exertion  . SLEEP APNEA, OBSTRUCTIVE     doesn't use CPAP  . Peripheral neuropathy   . Fibromyalgia   . Arthritis   . Joint pain   . Joint swelling   . Chronic constipation     takes OTC stool softener  . History of colon polyps   . Diverticulitis   . Urinary frequency   . Urinary urgency   . Nocturia   . History of kidney stones   . Anemia     supposed to be taking Vit B but doesn't  . Glaucoma   . DEPRESSION     takes Zoloft and Doxepin daily    Surgeries: Procedure(s): TOTAL KNEE ARTHROPLASTY on 06/15/2014   Consultants (if any):    Discharged Condition:  Improved  Hospital Course: [name] is an 58 y.o. male who was admitted 06/15/2014 with a diagnosis of <principal problem not specified> and went to the operating room on 06/15/2014 and underwent the above named procedures.    He was given perioperative antibiotics:  Anti-infectives   Start     Dose/Rate Route Frequency Ordered Stop   06/15/14 1400  ceFAZolin (ANCEF) IVPB 2 g/50 mL premix     2 g 100 mL/hr over 30 Minutes Intravenous Every 6 hours 06/15/14 1158 06/15/14 2359   06/15/14 0600  ceFAZolin (ANCEF) 3 g in dextrose 5 % 50 mL IVPB     3 g 160 mL/hr over 30 Minutes Intravenous On call to O.R. 06/14/14 1439 06/15/14 0755    .  He was given sequential compression devices, early ambulation, and home coumadin for DVT prophylaxis.  He benefited maximally from the hospital stay and there were no complications.    Recent vital signs:  Filed Vitals:   06/17/14 0600  BP: 115/51  Pulse: 74  Temp: 98 F (36.7 C)  Resp: 14    Recent laboratory studies:  Lab Results  Component Value Date   HGB 8.7* 06/15/2014   HGB 9.4* 06/07/2014   HGB 11.4* 02/16/2014   Lab Results  Component Value Date   WBC 14.0* 06/15/2014   PLT 315 06/15/2014   Lab Results  Component Value Date   INR 1.14 06/16/2014   Lab Results  Component Value Date   NA 135* 06/07/2014   K 4.5 06/07/2014   CL 98 06/07/2014   CO2 24 06/07/2014   BUN 19 06/07/2014   CREATININE 0.94 06/07/2014   GLUCOSE 87 06/07/2014    Discharge Medications:     Medication List    STOP taking these medications       oxyCODONE-acetaminophen 7.5-325 MG per tablet  Commonly known as:  PERCOCET  Replaced by:  oxyCODONE-acetaminophen 5-325 MG per tablet      TAKE these medications       albuterol 108 (90 BASE) MCG/ACT inhaler  Commonly known as:  PROVENTIL HFA;VENTOLIN HFA  Inhale 2 puffs into the lungs 4 (four) times daily.     ALPRAZolam 0.5 MG tablet  Commonly known as:  XANAX  Take 1 mg by mouth at bedtime.      amiodarone 100 MG tablet  Commonly known as:  PACERONE  Take 1 tablet (100 mg total) by mouth daily.     carisoprodol 350 MG tablet  Commonly known as:  SOMA  Take 1,050 mg by mouth at bedtime.     celecoxib 200 MG capsule  Commonly known as:  CELEBREX  Take 1 capsule (200 mg total) by mouth 2 (two) times daily.     clobetasol cream 0.05 %  Commonly known as:  TEMOVATE  Apply 1 application topically 2 (two) times daily.     digoxin 0.125 MG tablet  Commonly known as:  LANOXIN  Take 0.125 mg by mouth daily.     doxepin 25 MG capsule  Commonly known as:  SINEQUAN  Take 25 mg by mouth 2 (two) times daily.     Fluticasone-Salmeterol 250-50 MCG/DOSE Aepb  Commonly known as:  ADVAIR  Inhale 1-2 puffs into the lungs daily.     furosemide 40 MG tablet  Commonly known as:  LASIX  Take 40 mg by mouth 2 (two) times daily.     hydrocortisone 2.5 % cream  Apply 1 application topically 2 (two) times daily as needed (inflammation).     hydrOXYzine 25 MG tablet  Commonly known as:  ATARAX/VISTARIL  Take 1 tablet (25 mg total) by mouth 3 (three) times daily as needed. For itching     metFORMIN 500 MG tablet  Commonly known as:  GLUCOPHAGE  Take 500 mg by mouth 2 (two) times daily with a meal.     methylPREDNISolone 4 MG tablet  Commonly known as:  MEDROL DOSEPAK  follow package directions     omeprazole 20 MG capsule  Commonly known as:  PRILOSEC  Take 20 mg by mouth 2 (two) times daily before a meal.     ondansetron 4 MG tablet  Commonly known as:  ZOFRAN  Take 1 tablet (4 mg total) by mouth every 8 (eight) hours as needed for nausea.     oxyCODONE-acetaminophen 5-325 MG per tablet  Commonly known as:  ROXICET  Take 2 tablets by mouth every 4 (four) hours as needed.     sertraline 100 MG tablet  Commonly known as:  ZOLOFT  Take 2 tablets (200 mg total)  by mouth daily.     tiZANidine 2 MG tablet  Commonly known as:  ZANAFLEX  Take 2 mg by mouth 3 (three) times daily as  needed for muscle spasms.     traZODone 100 MG tablet  Commonly known as:  DESYREL  Take 50 mg by mouth at bedtime.     warfarin 5 MG tablet  Commonly known as:  COUMADIN  Take 2.5-5 mg by mouth daily. Take half a tab (2.5 mg) on M,W,F and all other days take 1 tab (5 mg)     zolpidem 10 MG tablet  Commonly known as:  AMBIEN  Take 10 mg by mouth at bedtime.      ASK your doctor about these medications       lisinopril 5 MG tablet  Commonly known as:  PRINIVIL,ZESTRIL  Take 1 tablet (5 mg total) by mouth daily.        Diagnostic Studies: Dg Knee 1-2 Views Right  06/15/2014   CLINICAL DATA:  Postop total knee arthroplasty.  EXAM: RIGHT KNEE - 1-2 VIEW  COMPARISON:  None.  FINDINGS: Sequelae of right total knee arthroplasty are identified. The prosthetic components are approximated. Postoperative gas is noted in the overlying soft tissues. No fracture is seen. Scattered atherosclerotic vascular calcification is noted.  IMPRESSION: Postoperative changes from right total knee arthroplasty.   Electronically Signed   By: Logan Bores   On: 06/15/2014 10:42    Disposition: 01-Home or Self Care      Discharge Instructions   Weight bearing as tolerated    Complete by:  As directed            Follow-up Information   Follow up with Renette Butters, MD. (as scheduled)    Specialty:  Orthopedic Surgery   Contact information:   [address] [place] [address] [phone]        Signed: Edmonia Lynch, D 06/17/2014, 7:34 AM

## 2014-06-17 NOTE — Progress Notes (Signed)
Patient ID: [name], male   DOB: [birth year], 58 y.o.   MRN: [id number]     Subjective:  Patient reports pain as mild.  Patient reports doing well and denies any CP or SOB.  States that he is ready to get home  Objective:   VITALS:   Filed Vitals:   06/16/14 1553 06/16/14 2054 06/16/14 2155 06/17/14 0600  BP:   129/52 115/51  Pulse:  69 68 74  Temp:   97.8 F (36.6 C) 98 F (36.7 C)  TempSrc:      Resp:  12 16 14   Weight:      SpO2: 96% 100% 96% 98%    ABD soft Sensation intact distally Dorsiflexion/Plantar flexion intact Incision: dressing C/D/I and no drainage   Lab Results  Component Value Date   WBC 14.0* 06/15/2014   HGB 8.7* 06/15/2014   HCT 26.6* 06/15/2014   MCV 80.4 06/15/2014   PLT 315 06/15/2014     Assessment/Plan: 2 Days Post-Op   Active Problems:   Arthritis of knee   Advance diet Up with therapy Discharge home with home health ABLA asymptomatic continue to monitor this AM with PT WBAT Follow up with Dr Edmonia Lynch in Two weeks   Remonia Richter 06/17/2014, 7:17 AM   Edmonia Lynch MD [phone]

## 2014-06-17 NOTE — Progress Notes (Signed)
Physical Therapy Treatment Patient Details Name: [name] MRN: [id number] DOB: 11/20/1955 Today's Date: 06/17/2014    History of Present Illness 58 y.o. male s/p right total knee arthroplasty.    PT Comments    Patient continues to progress well towards physical therapy goals ambulating up to 165 feet with supervision and use of a rolling walker. Tolerating exercise well and ROM progressing nicely. All education has been reviewed and feel he is adequate for d/c from a mobility standpoint. Patient will continue to benefit from skilled physical therapy services at home with HHPT to further improve independence with functional mobility.   Follow Up Recommendations  Home health PT;Supervision for mobility/OOB     Equipment Recommendations  Other (comment) (Pt states bariatric walker and hospital bed already ordered?)    Recommendations for Other Services OT consult     Precautions / Restrictions Precautions Precautions: Knee Required Braces or Orthoses: Knee Immobilizer - Right Restrictions Weight Bearing Restrictions: Yes RLE Weight Bearing: Weight bearing as tolerated    Mobility  Bed Mobility                  Transfers Overall transfer level: Needs assistance Equipment used: Rolling walker (2 wheeled) Transfers: Sit to/from Stand Sit to Stand: Supervision         General transfer comment: Supervision for safety. Correctly places hands on stable surface to stand. Good control with descent until last several inches into chair.  Ambulation/Gait Ambulation/Gait assistance: Supervision Ambulation Distance (Feet): 165 Feet Assistive device: Rolling walker (2 wheeled) Gait Pattern/deviations: Step-to pattern;Step-through pattern;Decreased step length - left;Decreased stance time - right;Antalgic   Gait velocity interpretation: Below normal speed for age/gender General Gait Details: Focused on step through gait pattern. VC for sequencing and forward gaze. No  loss of balance or knee buckling. Continues to move slow and cautiously.   Stairs            Wheelchair Mobility    Modified Rankin (Stroke Patients Only)       Balance                                    Cognition Arousal/Alertness: Awake/alert Behavior During Therapy: WFL for tasks assessed/performed Overall Cognitive Status: Within Functional Limits for tasks assessed                      Exercises Total Joint Exercises Ankle Circles/Pumps: AROM;Both;10 reps;Seated Quad Sets: AROM;10 reps;Seated;Right Long Arc Quad: Right;10 reps;Seated;AROM Knee Flexion: AROM;Right;10 reps;Seated Goniometric ROM: 3-86 degrees right knee flexion in sitting    General Comments        Pertinent Vitals/Pain Pain Assessment: 0-10 Pain Score: 8  Pain Location: Rt knee Pain Intervention(s): Limited activity within patient's tolerance;Monitored during session;Repositioned (declines having nurse notified)    Home Living                      Prior Function            PT Goals (current goals can now be found in the care plan section) Acute Rehab PT Goals PT Goal Formulation: With patient Time For Goal Achievement: 06/22/14 Potential to Achieve Goals: Good Progress towards PT goals: Progressing toward goals    Frequency  7X/week    PT Plan Current plan remains appropriate    Co-evaluation  End of Session   Activity Tolerance: Patient tolerated treatment well Patient left: in chair;with call bell/phone within reach     Time: 0833-0903 PT Time Calculation (min): 30 min  Charges:  $Gait Training: 8-22 mins $Therapeutic Exercise: 8-22 mins                    G Codes:       [employer], [place]  Ellouise Newer 06/17/2014, 9:12 AM

## 2014-06-17 NOTE — Progress Notes (Signed)
OT Cancellation Note  Patient Details Name: JAKORIAN MARENGO MRN: [id number] DOB: March 23, 1956   Cancelled Treatment:    Reason Eval/Treat Not Completed: Patient declined, no reason specified. Pt reports that he has a tub bench at home that he has been using for tub transfers and has no further ADL concerns. Pt ambulated 165 ft this morning with Supervision from PT. Acute OT to sign off.   Juluis Rainier [phone] 06/17/2014, 9:55 AM

## 2014-06-17 NOTE — Discharge Instructions (Signed)
Follow PT instructions  Keep your incision dry and covered  Resume home Coumadin course  Call the office with any questions or concerns.   Information on my medicine - Coumadin   (Warfarin)  This medication education was reviewed with me or my healthcare representative as part of my discharge preparation.  The pharmacist that spoke with me during my hospital stay was:  Arman Bogus, [facility]  Why was Coumadin prescribed for you? Coumadin was prescribed for you because you have a blood clot or a medical condition that can cause an increased risk of forming blood clots. Blood clots can cause serious health problems by blocking the flow of blood to the heart, lung, or brain. Coumadin can prevent harmful blood clots from forming. As a reminder your indication for Coumadin is:   Select from menu  What test will check on my response to Coumadin? While on Coumadin (warfarin) you will need to have an INR test regularly to ensure that your dose is keeping you in the desired range. The INR (international normalized ratio) number is calculated from the result of the laboratory test called prothrombin time (PT).  If an INR APPOINTMENT HAS NOT ALREADY BEEN MADE FOR YOU please schedule an appointment to have this lab work done by your health care provider within 7 days. Your INR goal is usually a number between:  2 to 3 or your provider may give you a more narrow range like 2-2.5.  Ask your health care provider during an office visit what your goal INR is.  What  do you need to  know  About  COUMADIN? Take Coumadin (warfarin) exactly as prescribed by your healthcare provider about the same time each day.  DO NOT stop taking without talking to the doctor who prescribed the medication.  Stopping without other blood clot prevention medication to take the place of Coumadin may increase your risk of developing a new clot or stroke.  Get refills before you run out.  What do you do if you miss a dose? If  you miss a dose, take it as soon as you remember on the same day then continue your regularly scheduled regimen the next day.  Do not take two doses of Coumadin at the same time.  Important Safety Information A possible side effect of Coumadin (Warfarin) is an increased risk of bleeding. You should call your healthcare provider right away if you experience any of the following:   Bleeding from an injury or your nose that does not stop.   Unusual colored urine (red or dark brown) or unusual colored stools (red or black).   Unusual bruising for unknown reasons.   A serious fall or if you hit your head (even if there is no bleeding).  Some foods or medicines interact with Coumadin (warfarin) and might alter your response to warfarin. To help avoid this:   Eat a balanced diet, maintaining a consistent amount of Vitamin K.   Notify your provider about major diet changes you plan to make.   Avoid alcohol or limit your intake to 1 drink for women and 2 drinks for men per day. (1 drink is 5 oz. wine, 12 oz. beer, or 1.5 oz. liquor.)  Make sure that ANY health care provider who prescribes medication for you knows that you are taking Coumadin (warfarin).  Also make sure the healthcare provider who is monitoring your Coumadin knows when you have started a new medication including herbals and non-prescription products.  Coumadin (Warfarin)  Major Drug Interactions  Increased Warfarin Effect Decreased Warfarin Effect  Alcohol (large quantities) Antibiotics (esp. Septra/Bactrim, Flagyl, Cipro) Amiodarone (Cordarone) Aspirin (ASA) Cimetidine (Tagamet) Megestrol (Megace) NSAIDs (ibuprofen, naproxen, etc.) Piroxicam (Feldene) Propafenone (Rythmol SR) Propranolol (Inderal) Isoniazid (INH) Posaconazole (Noxafil) Barbiturates (Phenobarbital) Carbamazepine (Tegretol) Chlordiazepoxide (Librium) Cholestyramine (Questran) Griseofulvin Oral Contraceptives Rifampin Sucralfate (Carafate) Vitamin K    Coumadin (Warfarin) Major Herbal Interactions  Increased Warfarin Effect Decreased Warfarin Effect  Garlic Ginseng Ginkgo biloba Coenzyme Q10 Green tea St. Johns wort    Coumadin (Warfarin) FOOD Interactions  Eat a consistent number of servings per week of foods HIGH in Vitamin K (1 serving =  cup)  Collards (cooked, or boiled & drained) Kale (cooked, or boiled & drained) Mustard greens (cooked, or boiled & drained) Parsley *serving size only =  cup Spinach (cooked, or boiled & drained) Swiss chard (cooked, or boiled & drained) Turnip greens (cooked, or boiled & drained)  Eat a consistent number of servings per week of foods MEDIUM-HIGH in Vitamin K (1 serving = 1 cup)  Asparagus (cooked, or boiled & drained) Broccoli (cooked, boiled & drained, or raw & chopped) Brussel sprouts (cooked, or boiled & drained) *serving size only =  cup Lettuce, raw (green leaf, endive, romaine) Spinach, raw Turnip greens, raw & chopped   These websites have more information on Coumadin (warfarin):  FailFactory.se; VeganReport.com.au;

## 2014-06-18 ENCOUNTER — Other Ambulatory Visit: Payer: Self-pay | Admitting: Internal Medicine

## 2014-06-18 ENCOUNTER — Ambulatory Visit: Payer: Commercial Managed Care - HMO | Admitting: *Deleted

## 2014-06-18 DIAGNOSIS — Z5181 Encounter for therapeutic drug level monitoring: Secondary | ICD-10-CM

## 2014-06-18 LAB — POCT INR: INR: 3.5

## 2014-06-24 ENCOUNTER — Ambulatory Visit: Payer: Commercial Managed Care - HMO | Admitting: *Deleted

## 2014-06-24 DIAGNOSIS — Z5181 Encounter for therapeutic drug level monitoring: Secondary | ICD-10-CM

## 2014-06-24 LAB — POCT INR: INR: 1.7

## 2014-06-28 ENCOUNTER — Institutional Professional Consult (permissible substitution): Payer: Commercial Managed Care - HMO | Admitting: Internal Medicine

## 2014-06-29 ENCOUNTER — Encounter: Payer: Self-pay | Admitting: Internal Medicine

## 2014-06-29 ENCOUNTER — Ambulatory Visit (INDEPENDENT_AMBULATORY_CARE_PROVIDER_SITE_OTHER): Payer: Commercial Managed Care - HMO | Admitting: Internal Medicine

## 2014-06-29 ENCOUNTER — Other Ambulatory Visit (INDEPENDENT_AMBULATORY_CARE_PROVIDER_SITE_OTHER): Payer: Commercial Managed Care - HMO

## 2014-06-29 VITALS — BP 122/58 | HR 74 | Ht 70.0 in | Wt 336.0 lb

## 2014-06-29 DIAGNOSIS — R21 Rash and other nonspecific skin eruption: Secondary | ICD-10-CM

## 2014-06-29 DIAGNOSIS — E119 Type 2 diabetes mellitus without complications: Secondary | ICD-10-CM

## 2014-06-29 LAB — HEPATIC FUNCTION PANEL
ALK PHOS: 60 U/L (ref 39–117)
ALT: 24 U/L (ref 0–53)
AST: 27 U/L (ref 0–37)
Albumin: 3.5 g/dL (ref 3.5–5.2)
BILIRUBIN DIRECT: 0.1 mg/dL (ref 0.0–0.3)
BILIRUBIN TOTAL: 0.5 mg/dL (ref 0.2–1.2)
Total Protein: 6.1 g/dL (ref 6.0–8.3)

## 2014-06-29 LAB — BASIC METABOLIC PANEL
BUN: 16 mg/dL (ref 6–23)
CO2: 30 mEq/L (ref 19–32)
CREATININE: 1 mg/dL (ref 0.4–1.5)
Calcium: 8.7 mg/dL (ref 8.4–10.5)
Chloride: 94 mEq/L — ABNORMAL LOW (ref 96–112)
GFR: 80.21 mL/min (ref >60.00)
Glucose, Bld: 83 mg/dL (ref 70–99)
Potassium: 4.5 mEq/L (ref 3.5–5.1)
SODIUM: 130 meq/L — AB (ref 135–145)

## 2014-06-29 LAB — LIPID PANEL
CHOL/HDL RATIO: 4
Cholesterol: 185 mg/dL (ref 0–200)
HDL: 51.4 mg/dL (ref >39.00)
LDL Cholesterol: 101 mg/dL — ABNORMAL HIGH (ref 0–99)
NonHDL: 133.6
Triglycerides: 162 mg/dL — ABNORMAL HIGH (ref 0.0–149.0)
VLDL: 32.4 mg/dL (ref 0.0–40.0)

## 2014-06-29 LAB — HEMOGLOBIN A1C: HEMOGLOBIN A1C: 5.9 % (ref 4.6–6.5)

## 2014-06-29 MED ORDER — PREDNISONE 5 MG PO TABS: ORAL_TABLET | ORAL | Status: DC

## 2014-06-29 MED ORDER — METHYLPREDNISOLONE ACETATE 80 MG/ML IJ SUSP
80.0000 mg | Freq: Once | INTRAMUSCULAR | Status: AC
  Administered 2014-06-29: 80 mg via INTRAMUSCULAR

## 2014-06-29 NOTE — Progress Notes (Signed)
06/29/13- 12 yoM former smoker  Referred by Dr. [name] for rash on neck and head, pt states it is an itchy and burning sensation.  Followed by Dr Gwenette Greet for COPD. History of OSA  But stopped wearing CPAP-uncomfortable. He describes a persistent rash over the past 3 years, usually involving his face and neck. Feels like a burn. Not raised. Crawling sensation on back of neck and face, occasionally itchy ears. Sometimes eyelids feel rough. He feels episodes coming on and getting worse. Rarely involves limbs or trunk. One dermatologist saw him several times then said "she couldn't help". Another dermatologist suggested Grover's disease. We look this up on Up to Date- the description and picture don't match at all. Steroids have worked best. We discussed steroid side effects. No combination of antihistamines has helped. I do not find a record of any skin biopsy.  Prior to Admission medications   Medication Sig Start Date End Date Taking? Authorizing Provider  albuterol (PROVENTIL HFA;VENTOLIN HFA) 108 (90 BASE) MCG/ACT inhaler Inhale 2 puffs into the lungs 4 (four) times daily.   Yes Historical Provider, MD  ALPRAZolam Duanne Moron) 0.5 MG tablet Take 1 mg by mouth at bedtime.   Yes Historical Provider, MD  amiodarone (PACERONE) 100 MG tablet Take 1 tablet (100 mg total) by mouth daily. 03/23/14  Yes Deboraha Sprang, MD  carisoprodol (SOMA) 350 MG tablet Take 1,050 mg by mouth at bedtime.   Yes Historical Provider, MD  clobetasol cream (TEMOVATE) 0.25 % Apply 1 application topically 2 (two) times daily.   Yes Historical Provider, MD  digoxin (LANOXIN) 0.125 MG tablet Take 0.125 mg by mouth daily.   Yes Historical Provider, MD  doxepin (SINEQUAN) 25 MG capsule Take 25 mg by mouth 2 (two) times daily.   Yes Historical Provider, MD  Fluticasone-Salmeterol (ADVAIR) 250-50 MCG/DOSE AEPB Inhale 1-2 puffs into the lungs daily.   Yes Historical Provider, MD  furosemide (LASIX) 40 MG tablet Take 40 mg by mouth 2 (two) times  daily.   Yes Historical Provider, MD  hydrocortisone 2.5 % cream Apply 1 application topically 2 (two) times daily as needed (inflammation).   Yes Historical Provider, MD  lisinopril (PRINIVIL,ZESTRIL) 5 MG tablet TAKE 1 TABLET (5 MG TOTAL) BY MOUTH DAILY. 06/18/14  Yes Deboraha Sprang, MD  metFORMIN (GLUCOPHAGE) 500 MG tablet Take 500 mg by mouth 2 (two) times daily with a meal.   Yes Historical Provider, MD  omeprazole (PRILOSEC) 20 MG capsule Take 20 mg by mouth 2 (two) times daily before a meal.   Yes Historical Provider, MD  ondansetron (ZOFRAN) 4 MG tablet Take 1 tablet (4 mg total) by mouth every 8 (eight) hours as needed for nausea. 06/16/14  Yes Renette Butters, MD  oxyCODONE-acetaminophen (ROXICET) 5-325 MG per tablet Take 2 tablets by mouth every 4 (four) hours as needed. 06/16/14  Yes Renette Butters, MD  sertraline (ZOLOFT) 100 MG tablet Take 2 tablets (200 mg total) by mouth daily. 03/02/14  Yes [name], MD  tiZANidine (ZANAFLEX) 2 MG tablet Take 2 mg by mouth 3 (three) times daily as needed for muscle spasms.   Yes Historical Provider, MD  traZODone (DESYREL) 100 MG tablet Take 50 mg by mouth at bedtime.   Yes Historical Provider, MD  warfarin (COUMADIN) 5 MG tablet Take 2.5-5 mg by mouth daily. Take half a tab (2.5 mg) on M,W,F and all other days take 1 tab (5 mg)   Yes Historical Provider, MD  zolpidem (AMBIEN)  10 MG tablet Take 10 mg by mouth at bedtime.   Yes Historical Provider, MD  celecoxib (CELEBREX) 200 MG capsule Take 1 capsule (200 mg total) by mouth 2 (two) times daily. 06/16/14   Renette Butters, MD  hydrOXYzine (ATARAX/VISTARIL) 25 MG tablet Take 1 tablet (25 mg total) by mouth 3 (three) times daily as needed. For itching 06/08/14   [name], MD  predniSONE (DELTASONE) 5 MG tablet 2 daily x 7 days, 1 daily x 7 days, then  One every other day 06/29/14   Deneise Lever, MD  predniSONE (DELTASONE) 5 MG tablet Try 2 daily x 7 days, then one daily x 7 days, then one every  other day 06/29/14   Deneise Lever, MD   Past Medical History  Diagnosis Date  . Chronic pain syndrome   . DYSKINESIA, ESOPHAGUS   . FIBROMYALGIA   . HYPERLIPIDEMIA     but unable to take any meds  . INSOMNIA     takes Ambien nightly  . LOW BACK PAIN     DDD and Herniated disc  . ORCHITIS   . Rash and other nonspecific skin eruption 04/12/2009  . Atrial fibrillation     takes Coumadin daily along with Pacerone  . Cardiomyopathy     Myoview with prior infarct- 2012; EF 25% TEE July 2013  . H/O hiatal hernia   . Myocardial infarction     hx of MI  . ANXIETY     takes Xanax nightly  . Muscle spasm     takes Zanaflex daily as needed  . ASTHMA     Albuterol prn and Advair daily;also takes Prednisone daily  . GERD     takes Omeprazole daily  . DIABETES MELLITUS, TYPE II     takes Metformin daily  . HYPERTENSION     takes Lisinopril daily  . Peripheral edema     takes Lasix daily  . CHF (congestive heart failure)     takes Furosemide daily  . COPD (chronic obstructive pulmonary disease)   . O2 dependent     2l/m via n/c  . Shortness of breath     lying,sitting,exertion  . SLEEP APNEA, OBSTRUCTIVE     doesn't use CPAP  . Peripheral neuropathy   . Fibromyalgia   . Arthritis   . Joint pain   . Joint swelling   . Chronic constipation     takes OTC stool softener  . History of colon polyps   . Diverticulitis   . Urinary frequency   . Urinary urgency   . Nocturia   . History of kidney stones   . Anemia     supposed to be taking Vit B but doesn't  . Glaucoma   . DEPRESSION     takes Zoloft and Doxepin daily   Past Surgical History  Procedure Laterality Date  . Tonsillectomy    . Acne cyst removal      2 on back   . Cardioversion  04/18/2012    Procedure: CARDIOVERSION;  Surgeon: Fay Records, MD;  Location: [place];  Service: Cardiovascular;  Laterality: N/A;  . Tee without cardioversion  04/25/2012    Procedure: TRANSESOPHAGEAL ECHOCARDIOGRAM (TEE);  Surgeon:  Thayer Headings, MD;  Location: [facility];  Service: Cardiovascular;  Laterality: N/A;  . Cardioversion  04/25/2012    Procedure: CARDIOVERSION;  Surgeon: Thayer Headings, MD;  Location: [place];  Service: Cardiovascular;  Laterality: N/A;  . Cardioversion  04/25/2012  Procedure: CARDIOVERSION;  Surgeon: Fay Records, MD;  Location: [facility];  Service: Cardiovascular;  Laterality: N/A;  . Cardioversion  05/09/2012    Procedure: CARDIOVERSION;  Surgeon: Sherren Mocha, MD;  Location: [facility];  Service: Cardiovascular;  Laterality: N/A;  changed from crenshaw to cooper by trish/leone-endo  . Colonoscopy    . Esophagogastroduodenoscopy    . Total knee arthroplasty Right 06/15/2014    Procedure: TOTAL KNEE ARTHROPLASTY;  Surgeon: Renette Butters, MD;  Location: [place];  Service: Orthopedics;  Laterality: Right;   Family History  Problem Relation Age of Onset  . COPD Mother   . Asthma Mother   . Colon polyps Mother   . Allergies Mother   . Asthma Maternal Grandmother    History   Social History  . Marital Status: Married    Spouse Name: N/A    Number of Children: 2  . Years of Education: N/A   Occupational History  . retired/disabled. prev worked in Therapist, sports.    Social History Main Topics  . Smoking status: Former Smoker -- 2.00 packs/day for 30 years    Types: Cigarettes    Quit date: 10/16/2007  . Smokeless tobacco: Never Used  . Alcohol Use: No  . Drug Use: No  . Sexual Activity: Not on file   Other Topics Concern  . Not on file   Social History Narrative   Lives alone.   ROS-see HPI Constitutional:   No-   weight loss, night sweats, fevers, chills, fatigue, lassitude. HEENT:   No-  headaches, difficulty swallowing, tooth/dental problems, sore throat,       No-  sneezing, itching, ear ache, nasal congestion, post nasal drip,  CV:  No-   chest pain, orthopnea, PND, swelling in lower extremities, anasarca,                                  dizziness,  palpitations Resp: No-   shortness of breath with exertion or at rest.              No-   productive cough,  No non-productive cough,  No- coughing up of blood.              No-   change in color of mucus.  No- wheezing.   Skin: + per HPI GI:  No-   heartburn, indigestion, abdominal pain, nausea, vomiting, diarrhea,                 change in bowel habits, loss of appetite GU: No-   dysuria, change in color of urine, no urgency or frequency.  No- flank pain. MS:  + joint pain or swelling.  + decreased range of motion.  + back pain. Neuro-     nothing unusual Psych:  No- change in mood or affect. No depression or anxiety.  No memory loss.  OBJ- Physical Exam General- Alert, Oriented, Affect-appropriate, Distress- none acute, obese Skin- +faint erythema on back of neck with no discrete lesions. I can't appreciate rash on his                   face. +Extensive old acne scarring Lymphadenopathy- none Head- atraumatic            Eyes- Gross vision intact, PERRLA, conjunctivae and secretions clear            Ears- Hearing, canals-normal  Nose- Clear, no-Septal dev, mucus, polyps, erosion, perforation             Throat- Mallampati II , mucosa clear , drainage- none, tonsils- atrophic Neck- flexible , trachea midline, no stridor , thyroid nl, carotid no bruit Chest - symmetrical excursion , unlabored           Heart/CV- RRR , no murmur , no gallop  , no rub, nl s1 s2                           - JVD- none , edema- none, stasis changes- none, varices- none           Lung- diminished/clear to P&A, wheeze- none, cough- none , dullness-none, rub- none           Chest wall-  Abd- tender-no, distended-no, bowel sounds-present, HSM- no Br/ Gen/ Rectal- Not done, not indicated Extrem- R knee wrapped s/p TKR, cane Neuro- grossly intact to observation

## 2014-06-29 NOTE — Patient Instructions (Signed)
Order- lab- Allergy profile, Food IgE profile  Prednisone 5 mg - Try 2 daily x 7 days, then one daily x 7 days, then one every other day  Depo 80

## 2014-06-29 NOTE — Assessment & Plan Note (Signed)
He complains of a rash which is very difficult to appreciate on exam today. Main involvement is with back of the neck and face by history. He reports that 2 different dermatologists failed to establish a diagnosis. Steroids worked best. We discussed steroid side effects and decided to explore impact of low-dose maintenance steroids, while sending in vitro allergy assessment. If dermatologists saw a rash, I wonder that we don't have a biopsy report.

## 2014-07-05 ENCOUNTER — Other Ambulatory Visit: Payer: Self-pay | Admitting: Internal Medicine

## 2014-07-06 ENCOUNTER — Other Ambulatory Visit: Payer: Self-pay

## 2014-07-06 MED ORDER — HYDROCORTISONE 2.5 % EX CREA: TOPICAL_CREAM | Freq: Two times a day (BID) | CUTANEOUS | Status: DC

## 2014-07-09 ENCOUNTER — Telehealth: Payer: Self-pay | Admitting: *Deleted

## 2014-07-09 NOTE — Telephone Encounter (Signed)
07/07/14: Faxed surgery clearance to [facility] for left total knee replacement on 08/17/14. Advised ok to stop Coumadin as needed, per Dr. [name].

## 2014-07-12 ENCOUNTER — Encounter: Payer: Medicare HMO | Attending: Physical Medicine & Rehabilitation | Admitting: Registered Nurse

## 2014-07-12 ENCOUNTER — Encounter: Payer: Self-pay | Admitting: Registered Nurse

## 2014-07-12 VITALS — BP 142/68 | HR 72 | Resp 14 | Wt 329.0 lb

## 2014-07-12 DIAGNOSIS — M545 Low back pain, unspecified: Secondary | ICD-10-CM | POA: Insufficient documentation

## 2014-07-12 DIAGNOSIS — I428 Other cardiomyopathies: Secondary | ICD-10-CM | POA: Diagnosis present

## 2014-07-12 DIAGNOSIS — IMO0002 Reserved for concepts with insufficient information to code with codable children: Secondary | ICD-10-CM | POA: Insufficient documentation

## 2014-07-12 DIAGNOSIS — M47817 Spondylosis without myelopathy or radiculopathy, lumbosacral region: Secondary | ICD-10-CM

## 2014-07-12 DIAGNOSIS — M1710 Unilateral primary osteoarthritis, unspecified knee: Secondary | ICD-10-CM | POA: Insufficient documentation

## 2014-07-12 DIAGNOSIS — Z79899 Other long term (current) drug therapy: Secondary | ICD-10-CM | POA: Diagnosis present

## 2014-07-12 DIAGNOSIS — M47814 Spondylosis without myelopathy or radiculopathy, thoracic region: Secondary | ICD-10-CM | POA: Insufficient documentation

## 2014-07-12 DIAGNOSIS — Z5181 Encounter for therapeutic drug level monitoring: Secondary | ICD-10-CM | POA: Diagnosis present

## 2014-07-12 DIAGNOSIS — M1712 Unilateral primary osteoarthritis, left knee: Secondary | ICD-10-CM

## 2014-07-12 DIAGNOSIS — M1711 Unilateral primary osteoarthritis, right knee: Secondary | ICD-10-CM

## 2014-07-12 MED ORDER — OXYCODONE-ACETAMINOPHEN 7.5-325 MG PO TABS: ORAL_TABLET | ORAL | Status: DC

## 2014-07-12 NOTE — Progress Notes (Signed)
Subjective:    Patient ID: [name], male    DOB: 1956/01/29, 58 y.o.   MRN: [id number]  HPI: [name] is a 58 year old male who returns for follow up for chronic pain and medication refill. He says his pain is located in his left knee and lower back. He rates his pain level 5. His current exercise regime is  performing stretching exercises.   He had Right Total Knee Arthroplasty on 06/15/14 by Dr. Percell Miller. He scheduled to have Left Total Knee Arthroplasty on 11/03 15 by Dr. Percell Miller.  Pain Inventory Average Pain 8 Pain Right Now 5 My pain is stabbing and aching  In the last 24 hours, has pain interfered with the following? General activity 5 Relation with others 5 Enjoyment of life 5 What TIME of day is your pain at its worst? evening Sleep (in general) Poor  Pain is worse with: walking, inactivity and some activites Pain improves with: heat/ice and medication Relief from Meds: 8  Mobility use a cane how many minutes can you walk? <10 ability to climb steps?  yes do you drive?  yes  Function disabled: date disabled 2008 I need assistance with the following:  household duties and shopping  Neuro/Psych weakness numbness tingling trouble walking spasms confusion  Prior Studies Any changes since last visit?  yes right TKR  Physicians involved in your care [place]   Family History  Problem Relation Age of Onset  . COPD Mother   . Asthma Mother   . Colon polyps Mother   . Allergies Mother   . Asthma Maternal Grandmother    History   Social History  . Marital Status: Married    Spouse Name: N/A    Number of Children: 2  . Years of Education: N/A   Occupational History  . retired/disabled. prev worked in Therapist, sports.    Social History Main Topics  . Smoking status: Former Smoker -- 2.00 packs/day for 30 years    Types: Cigarettes    Quit date: 10/16/2007  . Smokeless tobacco: Never Used  . Alcohol Use: No  . Drug  Use: No  . Sexual Activity: None   Other Topics Concern  . None   Social History Narrative   Lives alone.   Past Surgical History  Procedure Laterality Date  . Tonsillectomy    . Acne cyst removal      2 on back   . Cardioversion  04/18/2012    Procedure: CARDIOVERSION;  Surgeon: Fay Records, MD;  Location: [place];  Service: Cardiovascular;  Laterality: N/A;  . Tee without cardioversion  04/25/2012    Procedure: TRANSESOPHAGEAL ECHOCARDIOGRAM (TEE);  Surgeon: Thayer Headings, MD;  Location: [facility];  Service: Cardiovascular;  Laterality: N/A;  . Cardioversion  04/25/2012    Procedure: CARDIOVERSION;  Surgeon: Thayer Headings, MD;  Location: [facility] ENDOSCOPY;  Service: Cardiovascular;  Laterality: N/A;  . Cardioversion  04/25/2012    Procedure: CARDIOVERSION;  Surgeon: Fay Records, MD;  Location: [place];  Service: Cardiovascular;  Laterality: N/A;  . Cardioversion  05/09/2012    Procedure: CARDIOVERSION;  Surgeon: Sherren Mocha, MD;  Location: [facility];  Service: Cardiovascular;  Laterality: N/A;  changed from crenshaw to cooper by trish/leone-endo  . Colonoscopy    . Esophagogastroduodenoscopy    . Total knee arthroplasty Right 06/15/2014    Procedure: TOTAL KNEE ARTHROPLASTY;  Surgeon: Renette Butters, MD;  Location: [place];  Service: Orthopedics;  Laterality: Right;   Past Medical History  Diagnosis Date  . Chronic pain syndrome   . DYSKINESIA, ESOPHAGUS   . FIBROMYALGIA   . HYPERLIPIDEMIA     but unable to take any meds  . INSOMNIA     takes Ambien nightly  . LOW BACK PAIN     DDD and Herniated disc  . ORCHITIS   . Rash and other nonspecific skin eruption 04/12/2009  . Atrial fibrillation     takes Coumadin daily along with Pacerone  . Cardiomyopathy     Myoview with prior infarct- 2012; EF 25% TEE July 2013  . H/O hiatal hernia   . Myocardial infarction     hx of MI  . ANXIETY     takes Xanax nightly  . Muscle spasm     takes Zanaflex daily as needed  . ASTHMA      Albuterol prn and Advair daily;also takes Prednisone daily  . GERD     takes Omeprazole daily  . DIABETES MELLITUS, TYPE II     takes Metformin daily  . HYPERTENSION     takes Lisinopril daily  . Peripheral edema     takes Lasix daily  . CHF (congestive heart failure)     takes Furosemide daily  . COPD (chronic obstructive pulmonary disease)   . O2 dependent     2l/m via n/c  . Shortness of breath     lying,sitting,exertion  . SLEEP APNEA, OBSTRUCTIVE     doesn't use CPAP  . Peripheral neuropathy   . Fibromyalgia   . Arthritis   . Joint pain   . Joint swelling   . Chronic constipation     takes OTC stool softener  . History of colon polyps   . Diverticulitis   . Urinary frequency   . Urinary urgency   . Nocturia   . History of kidney stones   . Anemia     supposed to be taking Vit B but doesn't  . Glaucoma   . DEPRESSION     takes Zoloft and Doxepin daily   BP 142/68  Pulse 72  Resp 14  Wt 329 lb (149.233 kg)  SpO2 96%  Opioid Risk Score:   Fall Risk Score: Moderate Fall Risk (6-13 points) (previoulsy educated and declined handout)  Review of Systems  Gastrointestinal: Positive for constipation.  Musculoskeletal: Positive for gait problem.       Spasms  Skin: Positive for rash.  Neurological: Positive for weakness and numbness.       Tingling  Hematological: Bruises/bleeds easily.  Psychiatric/Behavioral: Positive for confusion.  All other systems reviewed and are negative.      Objective:   Physical Exam  Nursing note and vitals reviewed. Constitutional: He is oriented to person, place, and time. He appears well-developed and well-nourished.  HENT:  Head: Normocephalic and atraumatic.  Neck: Normal range of motion. Neck supple.  Cardiovascular: Normal rate and regular rhythm.   Pulmonary/Chest: Effort normal and breath sounds normal.  Musculoskeletal: He exhibits edema.  Normal Muscle Bulk and Muscle Testing Reveals: Upper Extremities: Full ROM  and Muscle Strength 5/5 Lumbar Paraspinal Tenderness: L-3- L-4 Lower Extremities: Right Patella Band-aid 's intact Decrease ROM with Extension Left : Full ROM and Muscle Strength 5/5 Left Leg Flexion Produces Pain into Patella Arises from chair with Ease / Using Straight cane  Neurological: He is alert and oriented to person, place, and time.  Skin: Skin is warm and dry.  Psychiatric: He has a  normal mood and affect.          Assessment & Plan:  1. Chronic Thoracic and Lumbar Spine Pain: Encouraged to increase activity. Continue current medication and heat therapy regime.  2. OA bilateral knees: S/PTotal Right Knee Arthroplasty. In November scheduled for Left Total Knee Arthroplasty. Refilled: oxyCODONE 7.5/325mg  one tablet every 6 hours as needed #90.   3. Obesity: Continue with exercise regime.  20 minutes of face to face patient care time was spent during this visit. All questions were encouraged and answered.   F/U in 1 month

## 2014-07-13 ENCOUNTER — Other Ambulatory Visit: Payer: Self-pay | Admitting: *Deleted

## 2014-07-13 MED ORDER — WARFARIN SODIUM 5 MG PO TABS: 2.5000 mg | ORAL_TABLET | Freq: Every day | ORAL | Status: DC

## 2014-07-17 ENCOUNTER — Other Ambulatory Visit: Payer: Self-pay | Admitting: Internal Medicine

## 2014-07-19 ENCOUNTER — Other Ambulatory Visit: Payer: Self-pay | Admitting: Internal Medicine

## 2014-07-19 ENCOUNTER — Telehealth: Payer: Self-pay | Admitting: Internal Medicine

## 2014-07-19 DIAGNOSIS — R21 Rash and other nonspecific skin eruption: Secondary | ICD-10-CM

## 2014-07-19 NOTE — Telephone Encounter (Signed)
Called and spoke with the pt and notified of recs per CDY  He will go ahead and refill the prednisone  I have called [facility] to obtain the labs, b/c the pt reports that he had the labs drawn on 06/29/14  They state that there are no results, they could not pull up the pt's account even by the DOS  I called our lab and spoke with Cassie  She states that it looks like part of the labs were done and part were not  The tech who drew pt's blood is not back until tomorrow Blanch Media) Will forward to KW to f/u on

## 2014-07-19 NOTE — Telephone Encounter (Signed)
Pt last seen Dr. Annamaria Boots on 06/29/14 with the following instructions:  Patient Instructions     Order- lab- Allergy profile, Food IgE profile  Prednisone 5 mg - Try 2 daily x 7 days, then one daily x 7 days, then one every other day  Depo 80   -------  Called, spoke with pt who reports he continues to have a red rash on neck and left side of face.  Rash itches and burns.  He has attempted to take only 2 of the 5mg  prednisone tablets but reports this did not help as it causes rash to be "full blown and drives me crazy."  States he has been taking 3-4 5 mg prednisone tablets per day to calm rash, itching, and burning.  He has only 1 day supply left and [facility]14 without new rx.  Dr. Annamaria Boots, pls advise if you have further recs and if new pred rx is ok.  Thank you.  Pending OV with Dr. Annamaria Boots: 08/19/14  [facility]  Allergies  Allergen Reactions  . Simvastatin     REACTION: severe leg cramps  . Statins Other (See Comments)    myalgia     Current Outpatient Prescriptions on File Prior to Visit  Medication Sig Dispense Refill  . albuterol (PROVENTIL HFA;VENTOLIN HFA) 108 (90 BASE) MCG/ACT inhaler Inhale 2 puffs into the lungs 4 (four) times daily.      [name] ALPRAZolam (XANAX) 0.5 MG tablet Take 1 mg by mouth at bedtime.      [name] amiodarone (PACERONE) 100 MG tablet Take 1 tablet (100 mg total) by mouth daily.  30 tablet  6  . carisoprodol (SOMA) 350 MG tablet Take 1,050 mg by mouth at bedtime.      . celecoxib (CELEBREX) 200 MG capsule Take 1 capsule (200 mg total) by mouth 2 (two) times daily.  60 capsule  0  . clobetasol cream (TEMOVATE) 9.76 % Apply 1 application topically 2 (two) times daily.      . digoxin (LANOXIN) 0.125 MG tablet Take 0.125 mg by mouth daily.      [name] doxepin (SINEQUAN) 25 MG capsule Take 25 mg by mouth 2 (two) times daily.      . Fluticasone-Salmeterol (ADVAIR) 250-50 MCG/DOSE AEPB Inhale 1-2 puffs into the lungs daily.      .  furosemide (LASIX) 40 MG tablet Take 40 mg by mouth 2 (two) times daily.      . furosemide (LASIX) 40 MG tablet TAKE 1 TABLET BY MOUTH TWICE A DAY  180 tablet  0  . hydrocortisone 2.5 % cream Apply topically 2 (two) times daily.  30 g  3  . hydrOXYzine (ATARAX/VISTARIL) 25 MG tablet Take 1 tablet (25 mg total) by mouth 3 (three) times daily as needed. For itching  30 tablet  1  . lisinopril (PRINIVIL,ZESTRIL) 5 MG tablet TAKE 1 TABLET (5 MG TOTAL) BY MOUTH DAILY.  30 tablet  3  . metFORMIN (GLUCOPHAGE) 500 MG tablet Take 500 mg by mouth 2 (two) times daily with a meal.      . omeprazole (PRILOSEC) 20 MG capsule Take 20 mg by mouth 2 (two) times daily before a meal.      . ondansetron (ZOFRAN) 4 MG tablet Take 1 tablet (4 mg total) by mouth every 8 (eight) hours as needed for nausea.  60 tablet  0  . oxyCODONE-acetaminophen (PERCOCET) 7.5-325 MG per tablet One tablet every 6 hours as  needed. No more than 4 pills a Day  100 tablet  0  . predniSONE (DELTASONE) 5 MG tablet 2 daily x 7 days, 1 daily x 7 days, then  One every other day  50 tablet  1  . predniSONE (DELTASONE) 5 MG tablet Try 2 daily x 7 days, then one daily x 7 days, then one every other day  30 tablet  2  . sertraline (ZOLOFT) 100 MG tablet Take 2 tablets (200 mg total) by mouth daily.  180 tablet  3  . tiZANidine (ZANAFLEX) 2 MG tablet Take 2 mg by mouth 3 (three) times daily as needed for muscle spasms.      . traZODone (DESYREL) 100 MG tablet Take 50 mg by mouth at bedtime.      [name] warfarin (COUMADIN) 5 MG tablet Take 0.5-1 tablets (2.5-5 mg total) by mouth daily. Take half a tab (2.5 mg) on M,W,F and all other days take 1 tab (5 mg)  30 tablet  3  . zolpidem (AMBIEN) 10 MG tablet Take 10 mg by mouth at bedtime.      . [DISCONTINUED] atorvastatin (LIPITOR) 20 MG tablet Take 1 tablet (20 mg total) by mouth daily.  30 tablet  11  . [DISCONTINUED] gabapentin (NEURONTIN) 300 MG capsule Take 1 capsule (300 mg total) by mouth 3 (three) times  daily.  90 capsule  5   No current facility-administered medications on file prior to visit.

## 2014-07-19 NOTE — Telephone Encounter (Signed)
We had ordered allergy profile and Food IgE profile last visit- not yet collected.  Ok to refill prednisone if he feels it helps any.

## 2014-07-20 NOTE — H&P (Signed)
  [name]/[facility] [address] [place], [place] [address] [phone] A Division of [facility]  [name], M.D.   [name], M.D.   [name], M.D.   [name], M.D.   [name], M.D [name], M.D.  [name], M.D.  [name], M.D.    [name], M.D. Zachary L. Fenton Malling, PA-C  Zachary A. [place]pp[place], PA-C  Zachary [place], PA-C [name], [place]   RE: Zachary, [name]   [id number]      DOB: [birth year] PROGRESS NOTE: 06-28-14 Reason for visit:  Follow-up right total knee arthroplasty on 06/15/14. History of present illness: Pain is well controlled no systemic symptoms. He did well with home physical therapy.   Please see associated documentation for this clinic visit for further past medical, family, surgical and social history, review of systems, and exam findings as this was reviewed by me.  EXAMINATION: Well appearing male in no apparent distress. Right lower extremity incisions benign and dry no surrounding erythema, he has range of motion 5-100 degrees.  He is neurovascularly intact.  IMAGING: X-rays reviewed by me: 3 views of the knee demonstrate stable hardware and good alignment.    ASSESSMENT/PLAN: 1. He is doing well. 2. We will change him to outpatient physical therapy. 3. He does not require more pain medications. 4. He would like to discuss left total knee arthroplasty we have tried injections anti-inflammatories activity modification ice and elevation without relief. Discussed risks benefits and possible complications  5. We discussed stress x-rays and he is not appropriate for unicompartmental replacement so we will discuss left total knee arthroplasty when he is 4 weeks out from the right.  Zachary Amble.  Percell [name], M.D.  Electronically verified by [name], M.D. TDM:kah D 06-28-14 T 06-29-14

## 2014-07-20 NOTE — Telephone Encounter (Signed)
Spoke with Buyer, retail; she is aware of the situation and states she will make sure the patient is not charged for the venipuncture charge form our side.   Spoke with patient-he is aware of the no charge for venipuncture and understands he will charged from [place] for the labs ran. He will come by tomorrow and have labs drawn(new order placed in [facility] for this) and lab is aware of this as well.   Nothing more needed from our side.

## 2014-07-21 ENCOUNTER — Other Ambulatory Visit: Payer: Commercial Managed Care - HMO

## 2014-07-21 ENCOUNTER — Other Ambulatory Visit: Payer: Self-pay

## 2014-07-21 DIAGNOSIS — I452 Bifascicular block: Secondary | ICD-10-CM

## 2014-07-21 DIAGNOSIS — I10 Essential (primary) hypertension: Secondary | ICD-10-CM

## 2014-07-21 DIAGNOSIS — I4891 Unspecified atrial fibrillation: Secondary | ICD-10-CM

## 2014-07-21 DIAGNOSIS — I5022 Chronic systolic (congestive) heart failure: Secondary | ICD-10-CM

## 2014-07-21 DIAGNOSIS — R21 Rash and other nonspecific skin eruption: Secondary | ICD-10-CM

## 2014-07-21 DIAGNOSIS — I428 Other cardiomyopathies: Secondary | ICD-10-CM

## 2014-07-21 MED ORDER — AMIODARONE HCL 100 MG PO TABS: 100.0000 mg | ORAL_TABLET | Freq: Every day | ORAL | Status: DC

## 2014-07-22 LAB — ALLERGY FULL PROFILE
Allergen, D pternoyssinus,d7: <0.1 kU/L
Aspergillus fumigatus, m3: <0.1 kU/L
Bahia Grass: <0.1 kU/L
Box Elder IgE: <0.1 kU/L
Common Ragweed: <0.1 kU/L
Curvularia lunata: <0.1 kU/L
Dog Dander: <0.1 kU/L
Elm IgE: <0.1 kU/L
G005 Rye, Perennial: <0.1 kU/L
G009 Red Top: <0.1 kU/L
Helminthosporium halodes: <0.1 kU/L
House Dust Hollister: <0.1 kU/L
Oak: <0.1 kU/L
Plantain: <0.1 kU/L
Sycamore Tree: <0.1 kU/L

## 2014-07-22 LAB — ALLERGEN FOOD PROFILE SPECIFIC IGE
Apple: <0.1 kU/L
Corn: <0.1 kU/L
Fish Cod: <0.1 kU/L
IgE (Immunoglobulin E), Serum: <2 kU/L (ref <115)
Peanut IgE: <0.1 kU/L
Soybean IgE: <0.1 kU/L
Tomato IgE: <0.1 kU/L
Wheat IgE: <0.1 kU/L

## 2014-07-23 ENCOUNTER — Telehealth: Payer: Self-pay | Admitting: *Deleted

## 2014-07-23 NOTE — Telephone Encounter (Signed)
Called and spoke with pt and he is aware of CY recs.  Nothing further is needed.  

## 2014-07-23 NOTE — Telephone Encounter (Signed)
I have seen him for rash. He would need to get lidoderm patches from his pain clinic or from his PCP.

## 2014-07-23 NOTE — Telephone Encounter (Signed)
Patient states that [name] was given Lidoderm patches at the pain clinic a while back and [name] felt that they helped a lot.  [name] wants to know Dr. Janee Morn opinion about the Lidoderm Patches and if [name] thinks that [name] should use these, if so, [name] would like RX for them.

## 2014-07-26 ENCOUNTER — Other Ambulatory Visit: Payer: Self-pay | Admitting: *Deleted

## 2014-07-26 MED ORDER — HYDROXYZINE HCL 25 MG PO TABS: 25.0000 mg | ORAL_TABLET | Freq: Three times a day (TID) | ORAL | Status: DC | PRN

## 2014-07-27 ENCOUNTER — Ambulatory Visit: Payer: Commercial Managed Care - HMO

## 2014-08-04 ENCOUNTER — Ambulatory Visit: Payer: Commercial Managed Care - HMO

## 2014-08-06 ENCOUNTER — Other Ambulatory Visit (HOSPITAL_COMMUNITY): Payer: Commercial Managed Care - HMO

## 2014-08-07 ENCOUNTER — Other Ambulatory Visit: Payer: Self-pay | Admitting: Internal Medicine

## 2014-08-09 ENCOUNTER — Encounter: Payer: Self-pay | Admitting: Physical Medicine & Rehabilitation

## 2014-08-09 ENCOUNTER — Encounter
Payer: Commercial Managed Care - HMO | Attending: Physical Medicine & Rehabilitation | Admitting: Physical Medicine & Rehabilitation

## 2014-08-09 ENCOUNTER — Telehealth: Payer: Self-pay | Admitting: Internal Medicine

## 2014-08-09 VITALS — HR 80 | Resp 14 | Ht 70.0 in | Wt 328.6 lb

## 2014-08-09 DIAGNOSIS — M659 Synovitis and tenosynovitis, unspecified: Secondary | ICD-10-CM | POA: Diagnosis not present

## 2014-08-09 DIAGNOSIS — I252 Old myocardial infarction: Secondary | ICD-10-CM | POA: Diagnosis not present

## 2014-08-09 DIAGNOSIS — M1732 Unilateral post-traumatic osteoarthritis, left knee: Secondary | ICD-10-CM

## 2014-08-09 DIAGNOSIS — Z87891 Personal history of nicotine dependence: Secondary | ICD-10-CM | POA: Diagnosis not present

## 2014-08-09 DIAGNOSIS — M797 Fibromyalgia: Secondary | ICD-10-CM | POA: Diagnosis not present

## 2014-08-09 DIAGNOSIS — R21 Rash and other nonspecific skin eruption: Secondary | ICD-10-CM

## 2014-08-09 DIAGNOSIS — M1711 Unilateral primary osteoarthritis, right knee: Secondary | ICD-10-CM | POA: Diagnosis present

## 2014-08-09 DIAGNOSIS — G8929 Other chronic pain: Secondary | ICD-10-CM | POA: Diagnosis not present

## 2014-08-09 MED ORDER — OXYCODONE-ACETAMINOPHEN 7.5-325 MG PO TABS: ORAL_TABLET | ORAL | Status: DC

## 2014-08-09 NOTE — Progress Notes (Signed)
Subjective:    Patient ID: [name], male    DOB: [birth year], 58 y.o.   MRN: [id number]  HPI Pain Inventory Average Pain 6 Pain Right Now 8 My pain is constant, sharp, stabbing and aching  In the last 24 hours, has pain interfered with the following? General activity 6 Relation with others 4 Enjoyment of life 7 What TIME of day is your pain at its worst? ALL Sleep (in general) Poor  Pain is worse with: walking, bending, standing and some activites Pain improves with: medication and injections Relief from Meds: 6  Mobility use a cane how many minutes can you walk? 5 minutes ability to climb steps?  yes do you drive?  yes  Function disabled: date disabled 2008  Neuro/Psych numbness tremor tingling trouble walking  Prior Studies Any changes since last visit?  no  Physicians involved in your care Any changes since last visit?  no   Family History  Problem Relation Age of Onset  . COPD Mother   . Asthma Mother   . Colon polyps Mother   . Allergies Mother   . Asthma Maternal Grandmother    History   Social History  . Marital Status: Married    Spouse Name: N/A    Number of Children: 2  . Years of Education: N/A   Occupational History  . retired/disabled. prev worked in Therapist, sports.    Social History Main Topics  . Smoking status: Former Smoker -- 2.00 packs/day for 30 years    Types: Cigarettes    Quit date: 10/16/2007  . Smokeless tobacco: Never Used  . Alcohol Use: No  . Drug Use: No  . Sexual Activity: None   Other Topics Concern  . None   Social History Narrative   Lives alone.   Past Surgical History  Procedure Laterality Date  . Tonsillectomy    . Acne cyst removal      2 on back   . Cardioversion  04/18/2012    Procedure: CARDIOVERSION;  Surgeon: Fay Records, MD;  Location: [place];  Service: Cardiovascular;  Laterality: N/A;  . Tee without cardioversion  04/25/2012    Procedure: TRANSESOPHAGEAL ECHOCARDIOGRAM (TEE);   Surgeon: Thayer Headings, MD;  Location: [facility];  Service: Cardiovascular;  Laterality: N/A;  . Cardioversion  04/25/2012    Procedure: CARDIOVERSION;  Surgeon: Thayer Headings, MD;  Location: [facility] ENDOSCOPY;  Service: Cardiovascular;  Laterality: N/A;  . Cardioversion  04/25/2012    Procedure: CARDIOVERSION;  Surgeon: Fay Records, MD;  Location: [facility];  Service: Cardiovascular;  Laterality: N/A;  . Cardioversion  05/09/2012    Procedure: CARDIOVERSION;  Surgeon: Sherren Mocha, MD;  Location: [place];  Service: Cardiovascular;  Laterality: N/A;  changed from crenshaw to cooper by trish/leone-endo  . Colonoscopy    . Esophagogastroduodenoscopy    . Total knee arthroplasty Right 06/15/2014    Procedure: TOTAL KNEE ARTHROPLASTY;  Surgeon: Renette Butters, MD;  Location: [place];  Service: Orthopedics;  Laterality: Right;   Past Medical History  Diagnosis Date  . Chronic pain syndrome   . DYSKINESIA, ESOPHAGUS   . FIBROMYALGIA   . HYPERLIPIDEMIA     but unable to take any meds  . INSOMNIA     takes Ambien nightly  . LOW BACK PAIN     DDD and Herniated disc  . ORCHITIS   . Rash and other nonspecific skin eruption 04/12/2009  . Atrial fibrillation     takes Coumadin  daily along with Pacerone  . Cardiomyopathy     Myoview with prior infarct- 2012; EF 25% TEE July 2013  . H/O hiatal hernia   . Myocardial infarction     hx of MI  . ANXIETY     takes Xanax nightly  . Muscle spasm     takes Zanaflex daily as needed  . ASTHMA     Albuterol prn and Advair daily;also takes Prednisone daily  . GERD     takes Omeprazole daily  . DIABETES MELLITUS, TYPE II     takes Metformin daily  . HYPERTENSION     takes Lisinopril daily  . Peripheral edema     takes Lasix daily  . CHF (congestive heart failure)     takes Furosemide daily  . COPD (chronic obstructive pulmonary disease)   . O2 dependent     2l/m via n/c  . Shortness of breath     lying,sitting,exertion  . SLEEP APNEA, OBSTRUCTIVE       doesn't use CPAP  . Peripheral neuropathy   . Fibromyalgia   . Arthritis   . Joint pain   . Joint swelling   . Chronic constipation     takes OTC stool softener  . History of colon polyps   . Diverticulitis   . Urinary frequency   . Urinary urgency   . Nocturia   . History of kidney stones   . Anemia     supposed to be taking Vit B but doesn't  . Glaucoma   . DEPRESSION     takes Zoloft and Doxepin daily   Pulse 80  Resp 14  Ht 5\' 10"  (1.778 m)  Wt 328 lb 9.6 oz (149.052 kg)  BMI 47.15 kg/m2  SpO2 92%  Opioid Risk Score:   Fall Risk Score: Low Fall Risk (0-5 points)   Review of Systems     Objective:   Physical Exam  Right knee without crepitus and effusion---incision noted. . Antalgia on left with pain in PROM and AROM. Mild effusion. Right quad and hamstrings only 4/5. Left q/h are 4+.    Assessment & Plan:   1. OA left knee:  After informed consent and preparation of the skin with betadine and isopropyl alcohol, I injected 6mg  (1cc) of celestone and 4cc of 1% lidocaine into the left knee via anterolateral approach. Additionally, aspiration was performed prior to injection. The patient tolerated well, and no complications were encountered. Afterward the area was cleaned and dressed. Post- injection instructions were provided.  2. Dequervain's tenosynovitis:  continue splinting, ice, relative rest, preservation of ROM.  3. Right knee--s/p TKA  Return in about 1 day (around 08/10/2014) for NP Visit.  Percocet was refilled today

## 2014-08-09 NOTE — Patient Instructions (Signed)
PLEASE CALL ME WITH ANY PROBLEMS OR QUESTIONS (#[phone]).      

## 2014-08-10 ENCOUNTER — Other Ambulatory Visit: Payer: Self-pay | Admitting: Internal Medicine

## 2014-08-11 NOTE — Telephone Encounter (Signed)
Done hardcopy to robin  

## 2014-08-11 NOTE — Telephone Encounter (Signed)
Faxed hardcopy for Alprazolam to [facility]

## 2014-08-13 MED ORDER — PREDNISONE 10 MG PO TABS: ORAL_TABLET | ORAL | Status: DC

## 2014-08-13 NOTE — Telephone Encounter (Signed)
Pt states that his body feel raw and inflamed. Pt requesting refill of Prednisone 5mg  tablets or taper dose. Pt states this is a similar flare of his rash as he had back in 06/29/14. Pt is requesting that a biopsy be done at some point to see what this inflammatory process is on his body.   Please advise Dr Annamaria Boots. Thanks.  Allergies  Allergen Reactions  . Simvastatin     REACTION: severe leg cramps  . Statins Other (See Comments)    myalgia

## 2014-08-13 NOTE — Telephone Encounter (Signed)
Per CY:  Pred taper (8 day) 10mg  -  Take 4 tablets by mouth x 2 days, then 3 x 2 days, then 2 x 2 days, 1 x 2 days then stop. #20 -- [facility] appt for rash Pt aware. Nothing further needed.

## 2014-08-17 ENCOUNTER — Encounter (HOSPITAL_COMMUNITY): Admission: RE | Payer: Self-pay | Source: Ambulatory Visit

## 2014-08-17 ENCOUNTER — Other Ambulatory Visit: Payer: Self-pay | Admitting: Internal Medicine

## 2014-08-17 ENCOUNTER — Inpatient Hospital Stay (HOSPITAL_COMMUNITY): Admission: RE | Admit: 2014-08-17 | Payer: Medicare HMO | Source: Ambulatory Visit | Admitting: Orthopedic Surgery

## 2014-08-17 SURGERY — ARTHROPLASTY, KNEE, TOTAL
Anesthesia: General | Laterality: Left

## 2014-08-17 NOTE — Telephone Encounter (Signed)
Done hardcopy to robin  

## 2014-08-18 NOTE — Telephone Encounter (Signed)
Faxed hardcopy for Carisprodol to [facility]

## 2014-08-19 ENCOUNTER — Telehealth: Payer: Self-pay

## 2014-08-19 ENCOUNTER — Encounter: Payer: Self-pay | Admitting: Internal Medicine

## 2014-08-19 ENCOUNTER — Ambulatory Visit: Payer: Commercial Managed Care - HMO | Admitting: Internal Medicine

## 2014-08-19 ENCOUNTER — Ambulatory Visit (INDEPENDENT_AMBULATORY_CARE_PROVIDER_SITE_OTHER): Payer: Medicare HMO | Admitting: Internal Medicine

## 2014-08-19 VITALS — BP 128/62 | HR 72 | Temp 97.7°F | Wt 328.0 lb

## 2014-08-19 DIAGNOSIS — K649 Unspecified hemorrhoids: Secondary | ICD-10-CM | POA: Insufficient documentation

## 2014-08-19 DIAGNOSIS — E119 Type 2 diabetes mellitus without complications: Secondary | ICD-10-CM

## 2014-08-19 DIAGNOSIS — Z0189 Encounter for other specified special examinations: Secondary | ICD-10-CM

## 2014-08-19 DIAGNOSIS — K640 First degree hemorrhoids: Secondary | ICD-10-CM

## 2014-08-19 DIAGNOSIS — L209 Atopic dermatitis, unspecified: Secondary | ICD-10-CM

## 2014-08-19 DIAGNOSIS — Z23 Encounter for immunization: Secondary | ICD-10-CM

## 2014-08-19 DIAGNOSIS — Z0000 Encounter for general adult medical examination without abnormal findings: Secondary | ICD-10-CM

## 2014-08-19 MED ORDER — PREDNISONE 10 MG PO TABS: ORAL_TABLET | ORAL | Status: DC

## 2014-08-19 MED ORDER — HYDROCORTISONE ACETATE 25 MG RE SUPP: 25.0000 mg | Freq: Two times a day (BID) | RECTAL | Status: DC

## 2014-08-19 MED ORDER — HYDROCORTISONE ACE-PRAMOXINE 1-1 % RE FOAM: 1.0000 | Freq: Two times a day (BID) | RECTAL | Status: DC

## 2014-08-19 NOTE — Progress Notes (Signed)
Pre visit review using our clinic review tool, if applicable. No additional management support is needed unless otherwise documented below in the visit note. 

## 2014-08-19 NOTE — Progress Notes (Signed)
Subjective:    Patient ID: [name], male    DOB: Apr 10, 1956, 58 y.o.   MRN: [id number]  HPI  Here to f/u; overall doing ok,  Pt denies chest pain, increased sob or doe, wheezing, orthopnea, PND, increased LE swelling, palpitations, dizziness or syncope.  Pt denies polydipsia, polyuria, or low sugar symptoms such as weakness or confusion improved with po intake.  Pt denies new neurological symptoms such as new headache, or facial or extremity weakness or numbness.   Pt states overall good compliance with meds, has been trying to follow lower cholesterol, diabetic diet, with wt overall stable,  but little exercise however.  Has chronic urticaria, asks for refill 8 day prednisone as before.  Also with ongoing ext hemorrhoid pain, asks for tx and referral, has occas bleeding BRB.  S/p right knee TKR but seems some unstable to him, so putting off the left TKR for now, walks with cane, last seen per ortho 2 wks Past Medical History  Diagnosis Date  . Chronic pain syndrome   . DYSKINESIA, ESOPHAGUS   . FIBROMYALGIA   . HYPERLIPIDEMIA     but unable to take any meds  . INSOMNIA     takes Ambien nightly  . LOW BACK PAIN     DDD and Herniated disc  . ORCHITIS   . Rash and other nonspecific skin eruption 04/12/2009  . Atrial fibrillation     takes Coumadin daily along with Pacerone  . Cardiomyopathy     Myoview with prior infarct- 2012; EF 25% TEE July 2013  . H/O hiatal hernia   . Myocardial infarction     hx of MI  . ANXIETY     takes Xanax nightly  . Muscle spasm     takes Zanaflex daily as needed  . ASTHMA     Albuterol prn and Advair daily;also takes Prednisone daily  . GERD     takes Omeprazole daily  . DIABETES MELLITUS, TYPE II     takes Metformin daily  . HYPERTENSION     takes Lisinopril daily  . Peripheral edema     takes Lasix daily  . CHF (congestive heart failure)     takes Furosemide daily  . COPD (chronic obstructive pulmonary disease)   . O2 dependent     2l/m  via n/c  . Shortness of breath     lying,sitting,exertion  . SLEEP APNEA, OBSTRUCTIVE     doesn't use CPAP  . Peripheral neuropathy   . Fibromyalgia   . Arthritis   . Joint pain   . Joint swelling   . Chronic constipation     takes OTC stool softener  . History of colon polyps   . Diverticulitis   . Urinary frequency   . Urinary urgency   . Nocturia   . History of kidney stones   . Anemia     supposed to be taking Vit B but doesn't  . Glaucoma   . DEPRESSION     takes Zoloft and Doxepin daily   Past Surgical History  Procedure Laterality Date  . Tonsillectomy    . Acne cyst removal      2 on back   . Cardioversion  04/18/2012    Procedure: CARDIOVERSION;  Surgeon: Fay Records, MD;  Location: [place];  Service: Cardiovascular;  Laterality: N/A;  . Tee without cardioversion  04/25/2012    Procedure: TRANSESOPHAGEAL ECHOCARDIOGRAM (TEE);  Surgeon: Thayer Headings, MD;  Location: [place];  Service: Cardiovascular;  Laterality: N/A;  . Cardioversion  04/25/2012    Procedure: CARDIOVERSION;  Surgeon: Thayer Headings, MD;  Location: [place];  Service: Cardiovascular;  Laterality: N/A;  . Cardioversion  04/25/2012    Procedure: CARDIOVERSION;  Surgeon: Fay Records, MD;  Location: [place];  Service: Cardiovascular;  Laterality: N/A;  . Cardioversion  05/09/2012    Procedure: CARDIOVERSION;  Surgeon: Sherren Mocha, MD;  Location: [place];  Service: Cardiovascular;  Laterality: N/A;  changed from crenshaw to cooper by trish/leone-endo  . Colonoscopy    . Esophagogastroduodenoscopy    . Total knee arthroplasty Right 06/15/2014    Procedure: TOTAL KNEE ARTHROPLASTY;  Surgeon: Renette Butters, MD;  Location: [place];  Service: Orthopedics;  Laterality: Right;    reports that he quit smoking about 6 years ago. His smoking use included Cigarettes. He has a 60 pack-year smoking history. He has never used smokeless tobacco. He reports that he does not drink alcohol or use illicit  drugs. family history includes Allergies in his mother; Asthma in his maternal grandmother and mother; COPD in his mother; Colon polyps in his mother. Allergies  Allergen Reactions  . Simvastatin     REACTION: severe leg cramps  . Statins Other (See Comments)    myalgia   / Current Outpatient Prescriptions on File Prior to Visit  Medication Sig Dispense Refill  . albuterol (PROVENTIL HFA;VENTOLIN HFA) 108 (90 BASE) MCG/ACT inhaler Inhale 2 puffs into the lungs 4 (four) times daily.    [name] ALPRAZolam (XANAX) 0.5 MG tablet TAKE 1 TABLET BY MOUTH TWICE A DAY AS NEEDED FOR ANXIETY 60 tablet 2  . amiodarone (PACERONE) 100 MG tablet Take 1 tablet (100 mg total) by mouth daily. 90 tablet 0  . carisoprodol (SOMA) 350 MG tablet TAKE 1 TABLET BY MOUTH 3 TIMES A DAY AS NEEDED 90 tablet 2  . clobetasol cream (TEMOVATE) 4.49 % Apply 1 application topically 2 (two) times daily.    . digoxin (LANOXIN) 0.125 MG tablet TAKE 1 TABLET BY MOUTH EVERY DAY 30 tablet 1  . doxepin (SINEQUAN) 25 MG capsule Take 25 mg by mouth 2 (two) times daily.    . Fluticasone-Salmeterol (ADVAIR) 250-50 MCG/DOSE AEPB Inhale 1-2 puffs into the lungs daily.    . furosemide (LASIX) 40 MG tablet Take 40 mg by mouth 2 (two) times daily.    . hydrocortisone 2.5 % cream Apply topically 2 (two) times daily. 30 g 3  . hydrOXYzine (ATARAX/VISTARIL) 25 MG tablet Take 1 tablet (25 mg total) by mouth 3 (three) times daily as needed. For itching 30 tablet 1  . metFORMIN (GLUCOPHAGE) 500 MG tablet Take 500 mg by mouth 2 (two) times daily with a meal.    . omeprazole (PRILOSEC) 20 MG capsule Take 20 mg by mouth 2 (two) times daily before a meal.    . ondansetron (ZOFRAN) 4 MG tablet Take 1 tablet (4 mg total) by mouth every 8 (eight) hours as needed for nausea. 60 tablet 0  . predniSONE (DELTASONE) 5 MG tablet 2 daily x 7 days, 1 daily x 7 days, then  One every other day 50 tablet 1  . predniSONE (DELTASONE) 5 MG tablet Try 2 daily x 7 days,  then one daily x 7 days, then one every other day 30 tablet 2  . sertraline (ZOLOFT) 100 MG tablet Take 2 tablets (200 mg total) by mouth daily. 180 tablet 3  . tiZANidine (ZANAFLEX) 2 MG tablet Take 2 mg  by mouth 3 (three) times daily as needed for muscle spasms.    . traZODone (DESYREL) 100 MG tablet Take 50 mg by mouth at bedtime.    [name] warfarin (COUMADIN) 5 MG tablet Take 0.5-1 tablets (2.5-5 mg total) by mouth daily. Take half a tab (2.5 mg) on M,W,F and all other days take 1 tab (5 mg) 30 tablet 3  . warfarin (COUMADIN) 5 MG tablet TAKE 1 TABLET BY MOUTH DAILY. 120 tablet 0  . zolpidem (AMBIEN) 10 MG tablet Take 10 mg by mouth at bedtime.    [name] lisinopril (PRINIVIL,ZESTRIL) 5 MG tablet TAKE 1 TABLET (5 MG TOTAL) BY MOUTH DAILY. 30 tablet 3  . [DISCONTINUED] atorvastatin (LIPITOR) 20 MG tablet Take 1 tablet (20 mg total) by mouth daily. 30 tablet 11  . [DISCONTINUED] gabapentin (NEURONTIN) 300 MG capsule Take 1 capsule (300 mg total) by mouth 3 (three) times daily. 90 capsule 5   No current facility-administered medications on file prior to visit.   Review of Systems  Constitutional: Negative for unusual diaphoresis or other sweats  HENT: Negative for ringing in ear Eyes: Negative for double vision or worsening visual disturbance.  Respiratory: Negative for choking and stridor.   Gastrointestinal: Negative for vomiting or other signifcant bowel change Genitourinary: Negative for hematuria or decreased urine volume.  Musculoskeletal: Negative for other MSK pain or swelling Skin: Negative for color change and worsening wound.  Neurological: Negative for tremors and numbness other than noted  Psychiatric/Behavioral: Negative for decreased concentration or agitation other than above       Objective:   Physical Exam BP 128/62 mmHg  Pulse 72  Temp(Src) 97.7 F (36.5 C) (Oral)  Wt 328 lb (148.78 kg)  SpO2 95% VS noted,  Constitutional: Pt appears well-developed, well-nourished.  HENT:  Head: NCAT.  Right Ear: External ear normal.  Left Ear: External ear normal.  Eyes: . Pupils are equal, round, and reactive to light. Conjunctivae and EOM are normal Neck: Normal range of motion. Neck supple.  Cardiovascular: Normal rate and regular rhythm.   Pulmonary/Chest: Effort normal and breath sounds normal.  Abd:  Soft, NT, ND, + BS Neurological: Pt is alert. Not confused , motor grossly intact Skin: Skin is warm. No rash Psychiatric: Pt behavior is normal. No agitation.     Assessment & Plan:

## 2014-08-19 NOTE — Telephone Encounter (Signed)
Fax from [facility] [place].  Stated Proctofoam HC 1% is non formulary, please send in alternative.

## 2014-08-19 NOTE — Assessment & Plan Note (Signed)
With recent BRB by report, for proctofoam HC, refer GI

## 2014-08-19 NOTE — Telephone Encounter (Signed)
Changed to anusol [facility] - done erx

## 2014-08-19 NOTE — Assessment & Plan Note (Signed)
With flare symtpoms = for predpac asd,  to f/u any worsening symptoms or concerns, has been referred to derm but unable to due to cost for now

## 2014-08-19 NOTE — Patient Instructions (Addendum)
Please take all new medication as prescribed - the prednisone, and the rectal cream  Please continue all other medications as before, and refills have been done if requested.  Please have the pharmacy call with any other refills you may need.  Please continue your efforts at being more active, low cholesterol diet, and weight control.  You are otherwise up to date with prevention measures today.  Please keep your appointments with your specialists as you may have planned  You will be contacted regarding the referral for: GI  Please return in 6 months, or sooner if needed, with Lab testing done 3-5 days before

## 2014-08-19 NOTE — Assessment & Plan Note (Signed)
stable overall by history and exam, recent data reviewed with pt, and pt to continue medical treatment as before,  to f/u any worsening symptoms or concerns Lab Results  Component Value Date   HGBA1C 5.9 06/29/2014

## 2014-08-20 ENCOUNTER — Other Ambulatory Visit: Payer: Self-pay

## 2014-08-20 MED ORDER — HYDROCORTISONE ACETATE 25 MG RE SUPP: 25.0000 mg | Freq: Two times a day (BID) | RECTAL | Status: DC

## 2014-08-24 NOTE — Telephone Encounter (Signed)
The patient stated the suppositories are too expensive $64, is there anything else to offer that would be affordable.

## 2014-08-25 ENCOUNTER — Ambulatory Visit (INDEPENDENT_AMBULATORY_CARE_PROVIDER_SITE_OTHER): Payer: Commercial Managed Care - HMO | Admitting: Certified Registered Nurse Anesthetist

## 2014-08-25 DIAGNOSIS — I4891 Unspecified atrial fibrillation: Secondary | ICD-10-CM

## 2014-08-25 DIAGNOSIS — Z5181 Encounter for therapeutic drug level monitoring: Secondary | ICD-10-CM

## 2014-08-25 LAB — POCT INR: INR: 1.4

## 2014-08-25 NOTE — Telephone Encounter (Signed)
Patient informed and will check with his pharmacist and call back

## 2014-08-25 NOTE — Telephone Encounter (Signed)
Called left message to call back 

## 2014-08-25 NOTE — Telephone Encounter (Signed)
Could pt ask the pharmacist for name of similar prep that costs less? thanks

## 2014-08-31 ENCOUNTER — Encounter: Payer: Medicare HMO | Admitting: Registered Nurse

## 2014-09-15 ENCOUNTER — Telehealth: Payer: Self-pay | Admitting: Gastroenterology

## 2014-09-15 NOTE — Telephone Encounter (Signed)
Patient reports pain due to hemorrhoids.  He has been treated by his primary care with anusol with no improvement.  He will come in and see Arta Bruce, PA on 09/17/14 9:15

## 2014-09-15 NOTE — Telephone Encounter (Signed)
Left message for patient to call back  

## 2014-09-16 ENCOUNTER — Other Ambulatory Visit: Payer: Self-pay | Admitting: Internal Medicine

## 2014-09-17 ENCOUNTER — Telehealth: Payer: Self-pay | Admitting: *Deleted

## 2014-09-17 ENCOUNTER — Encounter: Payer: Self-pay | Admitting: Physician Assistant

## 2014-09-17 ENCOUNTER — Ambulatory Visit (INDEPENDENT_AMBULATORY_CARE_PROVIDER_SITE_OTHER): Payer: Commercial Managed Care - HMO | Admitting: Physician Assistant

## 2014-09-17 VITALS — BP 136/68 | HR 72 | Ht 70.0 in | Wt 334.4 lb

## 2014-09-17 DIAGNOSIS — K5900 Constipation, unspecified: Secondary | ICD-10-CM

## 2014-09-17 DIAGNOSIS — Z86010 Personal history of colonic polyps: Secondary | ICD-10-CM

## 2014-09-17 MED ORDER — POLYETHYLENE GLYCOL 3350 17 GM/SCOOP PO POWD
1.0000 | Freq: Every day | ORAL | Status: DC
Start: 2014-09-17 — End: 2015-01-04

## 2014-09-17 MED ORDER — DILTIAZEM GEL 2 %: 1.0000 "application " | Freq: Three times a day (TID) | CUTANEOUS | Status: DC

## 2014-09-17 MED ORDER — MOVIPREP 100 G PO SOLR: 1.0000 | Freq: Once | ORAL | Status: DC

## 2014-09-17 MED ORDER — HYDROCORTISONE 2.5 % RE CREA: 1.0000 "application " | TOPICAL_CREAM | Freq: Three times a day (TID) | RECTAL | Status: DC

## 2014-09-17 NOTE — Telephone Encounter (Signed)
[place] for stopping coumadin 5 days prior to date of procedure, and restart next day unless o/w advised per provider involved with procedure

## 2014-09-17 NOTE — Patient Instructions (Addendum)
You have been scheduled for a colonoscopy with Dr. Stark at [facility]. Please follow written instructions given to you at your visit today.  Please pick up your prep kit at the pharmacy within the next 1-3 days. If you use inhalers (even only as needed), please bring them with you on the day of your procedure. Your physician has requested that you go to www.startemmi.com and enter the access code given to you at your visit today. This web site gives a general overview about your procedure. However, you should still follow specific instructions given to you by our office regarding your preparation for the procedure.  We have sent the following medications to [facility] for you to pick up at your convenience: Diltiazem Gel 2%, please apply to rectum three times daily for three to four weeks   We have sent the following medications to [facility] for you to pick up at your convenience: Anusol Cream, please apply to rectum three times daily to external hemorrhoids   We have sent the following medications to [facility] for you to pick up at your convenience: Miralax, take one to two capfuls and mix it in sixteen ounces of juice or water daily  You can titrate the Miralax up or down as needed to produce a bowel movement   You can purchase Tuck Wipes over the counter and use as directed _______________________________________________________________________________________________________________________________________________________________________________ Hemorrhoids Hemorrhoids are swollen veins around the rectum or anus. There are two types of hemorrhoids:   Internal hemorrhoids. These occur in the veins just inside the rectum. They may poke through to the outside and become irritated and painful.  External hemorrhoids. These occur in the veins outside the anus and can be felt as a painful swelling or hard lump near the anus. CAUSES  Pregnancy.   Obesity.   Constipation or diarrhea.    Straining to have a bowel movement.   Sitting for long periods on the toilet.  Heavy lifting or other activity that caused you to strain.  Anal intercourse. SYMPTOMS   Pain.   Anal itching or irritation.   Rectal bleeding.   Fecal leakage.   Anal swelling.   One or more lumps around the anus.  DIAGNOSIS  Your caregiver may be able to diagnose hemorrhoids by visual examination. Other examinations or tests that may be performed include:   Examination of the rectal area with a gloved hand (digital rectal exam).   Examination of anal canal using a small tube (scope).   A blood test if you have lost a significant amount of blood.  A test to look inside the colon (sigmoidoscopy or colonoscopy). TREATMENT Most hemorrhoids can be treated at home. However, if symptoms do not seem to be getting better or if you have a lot of rectal bleeding, your caregiver may perform a procedure to help make the hemorrhoids get smaller or remove them completely. Possible treatments include:   Placing a rubber band at the base of the hemorrhoid to cut off the circulation (rubber band ligation).   Injecting a chemical to shrink the hemorrhoid (sclerotherapy).   Using a tool to burn the hemorrhoid (infrared light therapy).   Surgically removing the hemorrhoid (hemorrhoidectomy).   Stapling the hemorrhoid to block blood flow to the tissue (hemorrhoid stapling).  HOME CARE INSTRUCTIONS   Eat foods with fiber, such as whole grains, beans, nuts, fruits, and vegetables. Ask your doctor about taking products with added fiber in them (fibersupplements).  Increase fluid intake. Drink enough water   and fluids to keep your urine clear or pale yellow.   Exercise regularly.   Go to the bathroom when you have the urge to have a bowel movement. Do not wait.   Avoid straining to have bowel movements.   Keep the anal area dry and clean. Use wet toilet paper or moist towelettes after a  bowel movement.   Medicated creams and suppositories may be used or applied as directed.   Only take over-the-counter or prescription medicines as directed by your caregiver.   Take warm sitz baths for 15-20 minutes, 3-4 times a day to ease pain and discomfort.   Place ice packs on the hemorrhoids if they are tender and swollen. Using ice packs between sitz baths may be helpful.   Put ice in a plastic bag.   Place a towel between your skin and the bag.   Leave the ice on for 15-20 minutes, 3-4 times a day.   Do not use a donut-shaped pillow or sit on the toilet for long periods. This increases blood pooling and pain.  SEEK MEDICAL CARE IF:  You have increasing pain and swelling that is not controlled by treatment or medicine.  You have uncontrolled bleeding.  You have difficulty or you are unable to have a bowel movement.  You have pain or inflammation outside the area of the hemorrhoids. MAKE SURE YOU:  Understand these instructions.  Will watch your condition.  Will get help right away if you are not doing well or get worse. Document Released: 09/28/2000 Document Revised: 09/17/2012 Document Reviewed: 08/05/2012 [facility] Patient Information 2015 [place], [place]. This information is not intended to replace advice given to you by your health care provider. Make sure you discuss any questions you have with your health care provider.   Hemorrhoids Hemorrhoids are swollen veins around the rectum or anus. There are two types of hemorrhoids:   Internal hemorrhoids. These occur in the veins just inside the rectum. They may poke through to the outside and become irritated and painful.  External hemorrhoids. These occur in the veins outside the anus and can be felt as a painful swelling or hard lump near the anus. CAUSES  Pregnancy.   Obesity.   Constipation or diarrhea.   Straining to have a bowel movement.   Sitting for long periods on the toilet.  Heavy  lifting or other activity that caused you to strain.  Anal intercourse. SYMPTOMS   Pain.   Anal itching or irritation.   Rectal bleeding.   Fecal leakage.   Anal swelling.   One or more lumps around the anus.  DIAGNOSIS  Your caregiver may be able to diagnose hemorrhoids by visual examination. Other examinations or tests that may be performed include:   Examination of the rectal area with a gloved hand (digital rectal exam).   Examination of anal canal using a small tube (scope).   A blood test if you have lost a significant amount of blood.  A test to look inside the colon (sigmoidoscopy or colonoscopy). TREATMENT Most hemorrhoids can be treated at home. However, if symptoms do not seem to be getting better or if you have a lot of rectal bleeding, your caregiver may perform a procedure to help make the hemorrhoids get smaller or remove them completely. Possible treatments include:   Placing a rubber band at the base of the hemorrhoid to cut off the circulation (rubber band ligation).   Injecting a chemical to shrink the hemorrhoid (sclerotherapy).   Using a  tool to burn the hemorrhoid (infrared light therapy).   Surgically removing the hemorrhoid (hemorrhoidectomy).   Stapling the hemorrhoid to block blood flow to the tissue (hemorrhoid stapling).  HOME CARE INSTRUCTIONS   Eat foods with fiber, such as whole grains, beans, nuts, fruits, and vegetables. Ask your doctor about taking products with added fiber in them (fibersupplements).  Increase fluid intake. Drink enough water and fluids to keep your urine clear or pale yellow.   Exercise regularly.   Go to the bathroom when you have the urge to have a bowel movement. Do not wait.   Avoid straining to have bowel movements.   Keep the anal area dry and clean. Use wet toilet paper or moist towelettes after a bowel movement.   Medicated creams and suppositories may be used or applied as directed.    Only take over-the-counter or prescription medicines as directed by your caregiver.   Take warm sitz baths for 15-20 minutes, 3-4 times a day to ease pain and discomfort.   Place ice packs on the hemorrhoids if they are tender and swollen. Using ice packs between sitz baths may be helpful.   Put ice in a plastic bag.   Place a towel between your skin and the bag.   Leave the ice on for 15-20 minutes, 3-4 times a day.   Do not use a donut-shaped pillow or sit on the toilet for long periods. This increases blood pooling and pain.  SEEK MEDICAL CARE IF:  You have increasing pain and swelling that is not controlled by treatment or medicine.  You have uncontrolled bleeding.  You have difficulty or you are unable to have a bowel movement.  You have pain or inflammation outside the area of the hemorrhoids. MAKE SURE YOU:  Understand these instructions.  Will watch your condition.  Will get help right away if you are not doing well or get worse. Document Released: 09/28/2000 Document Revised: 09/17/2012 Document Reviewed: 08/05/2012 [facility] Patient Information 2015 [place], [place]. This information is not intended to replace advice given to you by your health care provider. Make sure you discuss any questions you have with your health care provider.

## 2014-09-17 NOTE — Telephone Encounter (Signed)
Patient notified to hold Coumadin 5 days prior to procedure Patient verbalized understanding

## 2014-09-17 NOTE — Progress Notes (Signed)
Reviewed and agree with management plan.  Shaquelle Hernon T. Bertina Guthridge, MD FACG 

## 2014-09-17 NOTE — Telephone Encounter (Signed)
09/17/2014   RE: DEWIGHT CATINO DOB: November 28, 1955 MRN: [id number]   Dear Dr. [name],    We have scheduled the above patient for an Colonoscopy. Our records show that he is on anticoagulation therapy.   Please advise as to how long the patient may come off his therapy of Coumadin prior to the procedure, which is scheduled for 10-21-2014.  Please route the completed form to Evette Georges., CMA.   Sincerely,    Hope Pigeon

## 2014-09-17 NOTE — Progress Notes (Signed)
Patient ID: [name], male   DOB: Sep 26, 1956, 58 y.o.   MRN: [id number]    HPI: [name] is a 58 year old male referred for evaluation of rectal pain by Dr. Jenny [name]. Patient had had a colonoscopy in 2009 at which time adenomatous polyps were removed and he was advised to have surveillance in 5 years. He would like to schedule that at this time as well.  [name] has a history of chronic pain syndrome, esophageal dyskinesia, fibromyalgia, hyperlipidemia, insomnia, low back pain, dermatitis, atrial fibrillation, cardiomyopathy/Myoview with prior infarct 2012, ejection fraction 25% TEE July 2013, anxiety, asthma, GERD, type 2 diabetes, hypertension, peripheral edema, congestive heart failure, COPD for which he is oxygen dependent 2 L via nasal cannula, obstructive sleep apnea-does not use C Papp, peripheral neuropathy, chronic constipation, diverticulitis, kidney stones, glaucoma, and depression. He has had tonsillectomy, cardioversion several times, colonoscopy, EGD, and a right total knee arthroplasty.  He reports that he often strains to move his stools and has been having rectal pain. He has used several hemorrhoid creams over the past few months. But feels as if he is passing groundglass. He sometimes has blood on the toilet tissue and blood dripping in the commode. He uses milk of magnesia "3 or 4 gulps" every day and with this has a hard bowel movement every second to third day. He has no abdominal pain. His appetite has been good and his weight has been stable. He plans on having his other knee replaced in the spring and would like to have his surveillance colonoscopy done prior to having his knee replaced.   Past Medical History  Diagnosis Date  . Chronic pain syndrome   . DYSKINESIA, ESOPHAGUS   . FIBROMYALGIA   . HYPERLIPIDEMIA     but unable to take any meds  . INSOMNIA     takes Ambien nightly  . LOW BACK PAIN     DDD and Herniated disc  . ORCHITIS   . Rash and other nonspecific skin  eruption 04/12/2009  . Atrial fibrillation     takes Coumadin daily along with Pacerone  . Cardiomyopathy     Myoview with prior infarct- 2012; EF 25% TEE July 2013  . H/O hiatal hernia   . Myocardial infarction     hx of MI  . ANXIETY     takes Xanax nightly  . Muscle spasm     takes Zanaflex daily as needed  . ASTHMA     Albuterol prn and Advair daily;also takes Prednisone daily  . GERD     takes Omeprazole daily  . DIABETES MELLITUS, TYPE II     takes Metformin daily  . HYPERTENSION     takes Lisinopril daily  . Peripheral edema     takes Lasix daily  . CHF (congestive heart failure)     takes Furosemide daily  . COPD (chronic obstructive pulmonary disease)   . O2 dependent     2l/m via n/c  . Shortness of breath     lying,sitting,exertion  . SLEEP APNEA, OBSTRUCTIVE     doesn't use CPAP  . Peripheral neuropathy   . Fibromyalgia   . Arthritis   . Joint pain   . Joint swelling   . Chronic constipation     takes OTC stool softener  . History of colon polyps   . Diverticulitis   . Urinary frequency   . Urinary urgency   . Nocturia   . History of kidney stones   . Anemia  supposed to be taking Vit B but doesn't  . Glaucoma   . DEPRESSION     takes Zoloft and Doxepin daily    Past Surgical History  Procedure Laterality Date  . Tonsillectomy    . Acne cyst removal      2 on back   . Cardioversion  04/18/2012    Procedure: CARDIOVERSION;  Surgeon: Zachary Records, MD;  Location: [place];  Service: Cardiovascular;  Laterality: N/A;  . Tee without cardioversion  04/25/2012    Procedure: TRANSESOPHAGEAL ECHOCARDIOGRAM (TEE);  Surgeon: Zachary Headings, MD;  Location: [place];  Service: Cardiovascular;  Laterality: N/A;  . Cardioversion  04/25/2012    Procedure: CARDIOVERSION;  Surgeon: Zachary Headings, MD;  Location: [facility] ENDOSCOPY;  Service: Cardiovascular;  Laterality: N/A;  . Cardioversion  04/25/2012    Procedure: CARDIOVERSION;  Surgeon: Zachary Records, MD;   Location: [place];  Service: Cardiovascular;  Laterality: N/A;  . Cardioversion  05/09/2012    Procedure: CARDIOVERSION;  Surgeon: Zachary Mocha, MD;  Location: [place];  Service: Cardiovascular;  Laterality: N/A;  changed from crenshaw to [name] by [name]/[name]-endo  . Colonoscopy    . Esophagogastroduodenoscopy    . Total knee arthroplasty Right 06/15/2014    Procedure: TOTAL KNEE ARTHROPLASTY;  Surgeon: Zachary Butters, MD;  Location: [place];  Service: Orthopedics;  Laterality: Right;   Family History  Problem Relation Age of Onset  . COPD Mother   . Asthma Mother   . Colon polyps Mother   . Allergies Mother   . Asthma Maternal Grandmother   . Colon cancer Neg Hx    History  Substance Use Topics  . Smoking status: Former Smoker -- 2.00 packs/day for 30 years    Types: Cigarettes    Quit date: 10/16/2007  . Smokeless tobacco: Never Used  . Alcohol Use: No   Current Outpatient Prescriptions  Medication Sig Dispense Refill  . albuterol (PROVENTIL HFA;VENTOLIN HFA) 108 (90 BASE) MCG/ACT inhaler Inhale 2 puffs into the lungs 4 (four) times daily.    [name] ALPRAZolam (XANAX) 0.5 MG tablet TAKE 1 TABLET BY MOUTH TWICE A DAY AS NEEDED FOR ANXIETY 60 tablet 2  . amiodarone (PACERONE) 100 MG tablet Take 1 tablet (100 mg total) by mouth daily. 90 tablet 0  . carisoprodol (SOMA) 350 MG tablet TAKE 1 TABLET BY MOUTH 3 TIMES A DAY AS NEEDED 90 tablet 2  . clobetasol cream (TEMOVATE) 8.41 % Apply 1 application topically 2 (two) times daily.    . digoxin (LANOXIN) 0.125 MG tablet TAKE 1 TABLET BY MOUTH EVERY DAY 30 tablet 1  . doxepin (SINEQUAN) 25 MG capsule Take 25 mg by mouth 2 (two) times daily.    . Fluticasone-Salmeterol (ADVAIR) 250-50 MCG/DOSE AEPB Inhale 1-2 puffs into the lungs daily.    . furosemide (LASIX) 40 MG tablet Take 40 mg by mouth 2 (two) times daily.    [name] GLYCERIN ADULT 2 G suppository     . hydrocortisone (ANUSOL-HC) 25 MG suppository Place 1 suppository (25 mg total) rectally  every 12 (twelve) hours. 12 suppository 1  . hydrocortisone 2.5 % cream Apply topically 2 (two) times daily. 30 g 3  . hydrOXYzine (ATARAX/VISTARIL) 25 MG tablet Take 1 tablet (25 mg total) by mouth 3 (three) times daily as needed. For itching 30 tablet 1  . ibuprofen (ADVIL,MOTRIN) 200 MG tablet     . lisinopril (PRINIVIL,ZESTRIL) 5 MG tablet TAKE 1 TABLET (5 MG TOTAL) BY MOUTH DAILY.  30 tablet 3  . Loratadine 10 MG CAPS     . Magnesium Hydroxide (MILK OF MAGNESIA PO)     . metFORMIN (GLUCOPHAGE) 500 MG tablet Take 500 mg by mouth 2 (two) times daily with a meal.    . omeprazole (PRILOSEC) 20 MG capsule Take 20 mg by mouth 2 (two) times daily before a meal.    . ondansetron (ZOFRAN) 4 MG tablet Take 1 tablet (4 mg total) by mouth every 8 (eight) hours as needed for nausea. 60 tablet 0  . phenol (CHLORASEPTIC) 1.4 % LIQD     . predniSONE (DELTASONE) 5 MG tablet Try 2 daily x 7 days, then one daily x 7 days, then one every other day 30 tablet 2  . sertraline (ZOLOFT) 100 MG tablet Take 2 tablets (200 mg total) by mouth daily. 180 tablet 3  . Tolnaftate POWD     . traZODone (DESYREL) 100 MG tablet Take 50 mg by mouth at bedtime.    [name] warfarin (COUMADIN) 5 MG tablet Take 0.5-1 tablets (2.5-5 mg total) by mouth daily. Take half a tab (2.5 mg) on M,W,F and all other days take 1 tab (5 mg) 30 tablet 3  . witch hazel-glycerin (TUCKS) pad     . zolpidem (AMBIEN) 10 MG tablet Take 10 mg by mouth at bedtime.    [name] diltiazem 2 % GEL Apply 1 application topically 3 (three) times daily. 30 g 1  . hydrocortisone (ANUSOL-HC) 2.5 % rectal cream Place 1 application rectally 3 (three) times daily. 30 g 1  . MOVIPREP 100 G SOLR Take 1 kit (200 g total) by mouth once. 1 kit 0  . polyethylene glycol powder (GLYCOLAX/MIRALAX) powder Take 255 g by mouth daily. 255 g 3  . tiZANidine (ZANAFLEX) 2 MG tablet Take 2 mg by mouth 3 (three) times daily as needed for muscle spasms.    . [DISCONTINUED] atorvastatin (LIPITOR)  20 MG tablet Take 1 tablet (20 mg total) by mouth daily. 30 tablet 11  . [DISCONTINUED] gabapentin (NEURONTIN) 300 MG capsule Take 1 capsule (300 mg total) by mouth 3 (three) times daily. 90 capsule 5   No current facility-administered medications for this visit.   Allergies  Allergen Reactions  . Simvastatin     REACTION: severe leg cramps  . Statins Other (See Comments)    myalgia     Review of Systems: Gen: Denies any fever, chills, sweats, anorexia, fatigue, weakness, malaise, weight loss, and sleep disorder CV: Denies chest pain, angina Resp: Denies dyspnea at rest GI: Denies vomiting blood, jaundice, and fecal incontinence.   Denies dysphagia or odynophagia. GU : Denies urinary burning, blood in urine, urinary frequency, urinary hesitancy, nocturnal urination, and urinary incontinence. MS:Has joint pain, fibromyalgia Derm: C/O dry skin Psych: Denies depression, anxiety, memory loss, suicidal ideation, hallucinations, paranoia, and confusion. Heme: Denies bruising, bleeding, and enlarged lymph nodes. Neuro:  Denies any headaches, dizziness, paresthesias. Endo:  Hx diabetes    Prior Endoscopies: Colonoscopy 06/01/2008 was done for surveillance of adenomatous polyps his initial polypectomy was in 2006. At his 2009 colonoscopy multiple polyps hepatic flexure to transverse colon were removed he was also noted to have diverticulosis and internal hemorrhoids. He was advised to have surveillance colonoscopy in 5 years.  Physical Exam: BP 136/68 mmHg  Pulse 72  Ht 5' 10"  (1.778 m)  Wt 334 lb 6 oz (151.672 kg)  BMI 47.98 kg/m2 Constitutional: Pleasant,well-developed, male in no acute distress. HEENT: Normocephalic and atraumatic. Conjunctivae are normal.  No scleral icterus. Neck supple.  Cardiovascular: Normal rate, regular rhythm.  Pulmonary/chest: Effort normal and breath sounds normal. No wheezing, rales or rhonchi. Abdominal: Soft, nondistended, nontender. Bowel sounds  active throughout. There are no masses palpable. No hepatomegaly. Rectal: skin tag noted, anoscopy with fissure and internal hemorrhoid Extremities: no edema Lymphadenopathy: No cervical adenopathy noted. Neurological: Alert and oriented to person place and time. Skin: Skin is warm and dry. No rashes noted. Psychiatric: Normal mood and affect. Behavior is normal.  ASSESSMENT AND PLAN: #1 anal fissure and hemorrhoids. He will be given a trial of diltiazem ointment 2% to use rectally twice a day to 3 times a day for 3-4 weeks he will use Anusol HC cream to the external anal area as needed.  #2 history of adenomatous colon polyps. He is overdue for his surveillance colonoscopy and states he would like to schedule it for after the first of the year.The risks, benefits, and alternatives to colonoscopy with possible biopsy and possible polypectomy were discussed with the patient and they consent to proceed. The risk of holding anticoagulation therapy or antiplatelet medications was discussed including the increased risk for thromboembolic disease that may include DVT, pulmonary emboli and stroke. The patient understands this risk and is willing to proceed with temporally holding the medication provided that this is approved by her PCP or cardiologist. The procedure will be scheduled with Dr. Fuller Plan.  Further recommendations will be made pending the findings of his repeat colonoscopy.    Elida Harbin, Vita Barley PA-C 09/17/2014, 12:06 PM

## 2014-09-20 ENCOUNTER — Other Ambulatory Visit: Payer: Self-pay | Admitting: Internal Medicine

## 2014-09-21 ENCOUNTER — Ambulatory Visit (INDEPENDENT_AMBULATORY_CARE_PROVIDER_SITE_OTHER): Payer: Commercial Managed Care - HMO

## 2014-09-21 DIAGNOSIS — Z5181 Encounter for therapeutic drug level monitoring: Secondary | ICD-10-CM

## 2014-09-21 DIAGNOSIS — I4891 Unspecified atrial fibrillation: Secondary | ICD-10-CM

## 2014-09-21 LAB — POCT INR: INR: 2.2

## 2014-09-28 ENCOUNTER — Telehealth: Payer: Self-pay | Admitting: Physician Assistant

## 2014-09-28 NOTE — Telephone Encounter (Signed)
Spoke to the patient and the prescription was not covered by insurance.  He did say he has some 2.5 % hydrocortisone cream and I told him that is the same thing.  I did tell him to use that , he can insert some inside the rectum area.  He thanked me for calling him back.

## 2014-09-30 ENCOUNTER — Other Ambulatory Visit: Payer: Self-pay | Admitting: Internal Medicine

## 2014-10-01 ENCOUNTER — Encounter (HOSPITAL_COMMUNITY): Payer: Self-pay | Admitting: *Deleted

## 2014-10-01 NOTE — Telephone Encounter (Signed)
Called the patient left a detailed message of PCP instructions on refill request.

## 2014-10-01 NOTE — Telephone Encounter (Signed)
Prednisone is not a chronic refillable medication  Please consider OV - any MD or sat clinic (OR UC or ER)

## 2014-10-09 ENCOUNTER — Other Ambulatory Visit: Payer: Self-pay | Admitting: Internal Medicine

## 2014-10-12 ENCOUNTER — Ambulatory Visit (INDEPENDENT_AMBULATORY_CARE_PROVIDER_SITE_OTHER): Payer: Commercial Managed Care - HMO | Admitting: Internal Medicine

## 2014-10-12 ENCOUNTER — Encounter: Payer: Self-pay | Admitting: Internal Medicine

## 2014-10-12 VITALS — BP 112/68 | HR 77 | Temp 98.2°F | Ht 70.0 in | Wt 331.1 lb

## 2014-10-12 DIAGNOSIS — L209 Atopic dermatitis, unspecified: Secondary | ICD-10-CM

## 2014-10-12 DIAGNOSIS — J439 Emphysema, unspecified: Secondary | ICD-10-CM

## 2014-10-12 DIAGNOSIS — I10 Essential (primary) hypertension: Secondary | ICD-10-CM

## 2014-10-12 MED ORDER — METHYLPREDNISOLONE ACETATE 80 MG/ML IJ SUSP
80.0000 mg | Freq: Once | INTRAMUSCULAR | Status: AC
  Administered 2014-10-12: 80 mg via INTRAMUSCULAR

## 2014-10-12 MED ORDER — HYDROXYZINE HCL 25 MG PO TABS: 25.0000 mg | ORAL_TABLET | Freq: Three times a day (TID) | ORAL | Status: DC | PRN

## 2014-10-12 MED ORDER — PREDNISONE 10 MG PO TABS: ORAL_TABLET | ORAL | Status: DC

## 2014-10-12 NOTE — Assessment & Plan Note (Signed)
stable overall by history and exam, recent data reviewed with pt, and pt to continue medical treatment as before,  to f/u any worsening symptoms or concerns BP Readings from Last 3 Encounters:  10/12/14 112/68  09/17/14 136/68  08/19/14 128/62

## 2014-10-12 NOTE — Patient Instructions (Signed)
Please take all new medication as prescribed  - the prednisone, and the hydroxyzine  You had the steroid shot today  Please continue all other medications as before, and refills have been done if requested.  Please have the pharmacy call with any other refills you may need.  Please keep your appointments with your specialists as you may have planned

## 2014-10-12 NOTE — Assessment & Plan Note (Signed)
stable overall by history and exam, recent data reviewed with pt, and pt to continue medical treatment as before,  to f/u any worsening symptoms or concerns SpO2 Readings from Last 3 Encounters:  10/12/14 96%  08/19/14 95%  08/09/14 92%

## 2014-10-12 NOTE — Progress Notes (Signed)
Pre visit review using our clinic review tool, if applicable. No additional management support is needed unless otherwise documented below in the visit note. 

## 2014-10-12 NOTE — Progress Notes (Signed)
Subjective:    Patient ID: [name], male    DOB: 04-May-1956, 58 y.o.   MRN: [id number]  HPI  Here with flare or recurring head and neck rash with marked itching, hx of atopic dermatitis, has been referred to derm in past but he deferred to have hemorrhoid tx instead at the time.  Usually need steroid shot, predpac and atarax for control.  Pt denies chest pain, increased sob or doe, wheezing, orthopnea, PND, increased LE swelling, palpitations, dizziness or syncope. Pt denies new neurological symptoms such as new headache, or facial or extremity weakness or numbness Past Medical History  Diagnosis Date  . Chronic pain syndrome   . DYSKINESIA, ESOPHAGUS   . FIBROMYALGIA   . HYPERLIPIDEMIA     but unable to take any meds  . INSOMNIA     takes Ambien nightly  . LOW BACK PAIN     DDD and Herniated disc  . ORCHITIS   . Rash and other nonspecific skin eruption 04/12/2009    no cause found saw dermatologists x 2 and allergist  . Atrial fibrillation     takes Coumadin daily along with Pacerone  . Cardiomyopathy     Myoview with prior infarct- 2012; EF 25% TEE July 2013  . H/O hiatal hernia   . Myocardial infarction     hx of MI  . ANXIETY     takes Xanax nightly  . Muscle spasm     takes Zanaflex daily as needed  . ASTHMA     Albuterol prn and Advair daily;also takes Prednisone daily  . GERD     takes Omeprazole daily  . DIABETES MELLITUS, TYPE II     takes Metformin daily  . HYPERTENSION     takes Lisinopril daily  . Peripheral edema     takes Lasix daily  . CHF (congestive heart failure)     takes Furosemide daily  . COPD (chronic obstructive pulmonary disease)   . O2 dependent     uses oxygen 2 and 1/2 liters per [place] at hs  . Shortness of breath     lying,sitting,exertion  . SLEEP APNEA, OBSTRUCTIVE     doesn't use CPAP  . Peripheral neuropathy   . Fibromyalgia   . Arthritis   . Joint pain   . Joint swelling   . Chronic constipation     takes OTC stool softener    . History of colon polyps   . Diverticulitis   . Urinary urgency   . Nocturia   . History of kidney stones   . Glaucoma   . DEPRESSION     takes Zoloft and Doxepin daily  . Anemia     supposed to be taking Vit B but doesn't   Past Surgical History  Procedure Laterality Date  . Tonsillectomy    . Acne cyst removal      2 on back   . Cardioversion  04/18/2012    Procedure: CARDIOVERSION;  Surgeon: Fay Records, MD;  Location: [place];  Service: Cardiovascular;  Laterality: N/A;  . Tee without cardioversion  04/25/2012    Procedure: TRANSESOPHAGEAL ECHOCARDIOGRAM (TEE);  Surgeon: Thayer Headings, MD;  Location: [place];  Service: Cardiovascular;  Laterality: N/A;  . Cardioversion  04/25/2012    Procedure: CARDIOVERSION;  Surgeon: Thayer Headings, MD;  Location: [place];  Service: Cardiovascular;  Laterality: N/A;  . Cardioversion  04/25/2012    Procedure: CARDIOVERSION;  Surgeon: Fay Records, MD;  Location:  [place] [facility];  Service: Cardiovascular;  Laterality: N/A;  . Cardioversion  05/09/2012    Procedure: CARDIOVERSION;  Surgeon: Sherren Mocha, MD;  Location: [facility] OR;  Service: Cardiovascular;  Laterality: N/A;  changed from crenshaw to cooper by trish/leone-endo  . Colonoscopy    . Esophagogastroduodenoscopy    . Total knee arthroplasty Right 06/15/2014    Procedure: TOTAL KNEE ARTHROPLASTY;  Surgeon: Renette Butters, MD;  Location: [place];  Service: Orthopedics;  Laterality: Right;    reports that he quit smoking about 6 years ago. His smoking use included Cigarettes. He has a 60 pack-year smoking history. He has never used smokeless tobacco. He reports that he does not drink alcohol or use illicit drugs. family history includes Allergies in his mother; Asthma in his maternal grandmother and mother; COPD in his mother; Colon polyps in his mother. There is no history of Colon cancer. Allergies  Allergen Reactions  . Simvastatin     REACTION: severe leg cramps  . Statins Other (See  Comments)    myalgia   Current Outpatient Prescriptions on File Prior to Visit  Medication Sig Dispense Refill  . albuterol (PROVENTIL HFA;VENTOLIN HFA) 108 (90 BASE) MCG/ACT inhaler Inhale 2 puffs into the lungs 4 (four) times daily.    [name] ALPRAZolam (XANAX) 0.5 MG tablet TAKE 1 TABLET BY MOUTH TWICE A DAY AS NEEDED FOR ANXIETY 60 tablet 2  . ALPRAZolam (XANAX) 0.5 MG tablet Take 0.5 mg by mouth 2 (two) times daily as needed for anxiety.    [name] amiodarone (PACERONE) 100 MG tablet Take 1 tablet (100 mg total) by mouth daily. 90 tablet 0  . carisoprodol (SOMA) 350 MG tablet TAKE 1 TABLET BY MOUTH 3 TIMES A DAY AS NEEDED 90 tablet 2  . carisoprodol (SOMA) 350 MG tablet Take 350 mg by mouth 3 (three) times daily.    . clobetasol cream (TEMOVATE) 3.22 % Apply 1 application topically 2 (two) times daily.    . digoxin (LANOXIN) 0.125 MG tablet TAKE 1 TABLET BY MOUTH EVERY DAY 30 tablet 1  . digoxin (LANOXIN) 0.125 MG tablet Take 0.125 mg by mouth every morning.    . diltiazem 2 % GEL Apply 1 application topically 3 (three) times daily. 30 g 1  . doxepin (SINEQUAN) 25 MG capsule Take 25 mg by mouth 2 (two) times daily.    [name] doxepin (SINEQUAN) 25 MG capsule Take 1 capsule (25 mg total) by mouth 2 (two) times daily. 180 capsule 1  . Fluticasone-Salmeterol (ADVAIR) 250-50 MCG/DOSE AEPB Inhale 1-2 puffs into the lungs daily.    . furosemide (LASIX) 40 MG tablet Take 40 mg by mouth 2 (two) times daily.    . hydrocortisone (ANUSOL-HC) 2.5 % rectal cream Place 1 application rectally 3 (three) times daily. 30 g 1  . hydrocortisone (ANUSOL-HC) 25 MG suppository Place 1 suppository (25 mg total) rectally every 12 (twelve) hours. 12 suppository 1  . hydrocortisone (ANUSOL-HC) 25 MG suppository PLACE 1 SUPPOSITORY RECTALLY EVERY 12 HOURS. 12 suppository 1  . hydrocortisone 2.5 % cream Apply topically 2 (two) times daily. 30 g 3  . hydrOXYzine (ATARAX/VISTARIL) 25 MG tablet Take 1 tablet (25 mg total) by mouth 3  (three) times daily as needed. For itching 30 tablet 1  . ibuprofen (ADVIL,MOTRIN) 200 MG tablet Take 200 mg by mouth every 6 (six) hours as needed for mild pain.     [name] lisinopril (PRINIVIL,ZESTRIL) 5 MG tablet TAKE 1 TABLET (5 MG TOTAL) BY MOUTH DAILY. [place]  tablet 3  . lisinopril (PRINIVIL,ZESTRIL) 5 MG tablet Take 5 mg by mouth daily with lunch.    . Loratadine 10 MG CAPS Take 1 capsule by mouth daily.     . Magnesium Hydroxide (MILK OF MAGNESIA PO) Take 10 mLs by mouth 2 (two) times daily as needed.     . magnesium hydroxide (MILK OF MAGNESIA) 400 MG/5ML suspension Take 30 mLs by mouth daily as needed for mild constipation.    . metFORMIN (GLUCOPHAGE) 500 MG tablet Take 500 mg by mouth 2 (two) times daily with a meal.    . MOVIPREP 100 G SOLR Take 1 kit (200 g total) by mouth once. 1 kit 0  . omeprazole (PRILOSEC) 20 MG capsule Take 20 mg by mouth 2 (two) times daily before a meal.    . ondansetron (ZOFRAN) 4 MG tablet Take 1 tablet (4 mg total) by mouth every 8 (eight) hours as needed for nausea. 60 tablet 0  . phenol (CHLORASEPTIC) 1.4 % LIQD Use as directed 1 spray in the mouth or throat as needed for throat irritation / pain.     . polyethylene glycol (MIRALAX / GLYCOLAX) packet Take 17 g by mouth daily.    . polyethylene glycol powder (GLYCOLAX/MIRALAX) powder Take 255 g by mouth daily. 255 g 3  . predniSONE (DELTASONE) 5 MG tablet Try 2 daily x 7 days, then one daily x 7 days, then one every other day (Patient taking differently: Try 2 daily x 7 days, then one daily x 7 days, then one every other day, will finish before 10-21-14 procedure) 30 tablet 2  . sertraline (ZOLOFT) 100 MG tablet Take 2 tablets (200 mg total) by mouth daily. (Patient taking differently: Take 200 mg by mouth at bedtime. ) 180 tablet 3  . tiZANidine (ZANAFLEX) 2 MG tablet Take 2 mg by mouth 3 (three) times daily as needed for muscle spasms.    . Tolnaftate POWD Apply 1 application topically daily.     . traZODone  (DESYREL) 100 MG tablet Take 50 mg by mouth at bedtime.    [name] warfarin (COUMADIN) 5 MG tablet Take 0.5-1 tablets (2.5-5 mg total) by mouth daily. Take half a tab (2.5 mg) on M,W,F and all other days take 1 tab (5 mg) 30 tablet 3  . witch hazel-glycerin (TUCKS) pad Apply 1 application topically as needed for itching or irritation.     [name] zolpidem (AMBIEN) 10 MG tablet Take 10 mg by mouth at bedtime.    . [DISCONTINUED] atorvastatin (LIPITOR) 20 MG tablet Take 1 tablet (20 mg total) by mouth daily. 30 tablet 11  . [DISCONTINUED] gabapentin (NEURONTIN) 300 MG capsule Take 1 capsule (300 mg total) by mouth 3 (three) times daily. 90 capsule 5   No current facility-administered medications on file prior to visit.    Review of Systems  Constitutional: Negative for unusual diaphoresis or other sweats  HENT: Negative for ringing in ear Eyes: Negative for double vision or worsening visual disturbance.  Respiratory: Negative for choking and stridor.   Gastrointestinal: Negative for vomiting or other signifcant bowel change Genitourinary: Negative for hematuria or decreased urine volume.  Musculoskeletal: Negative for other MSK pain or swelling Skin: Negative for color change and worsening wound.  Neurological: Negative for tremors and numbness other than noted  Psychiatric/Behavioral: Negative for decreased concentration or agitation other than above       Objective:   Physical Exam BP 112/68 mmHg  Pulse 77  Temp(Src) 98.2 F (36.8  C) (Oral)  Ht _0  (1.778 m)  Wt 331 lb 2 oz (150.197 kg)  BMI 47.51 kg/m2  SpO2 96% VS noted, non toxic Constitutional: Pt appears well-developed, well-nourished.  HENT: Head: NCAT.  Right Ear: External ear normal.  Left Ear: External ear normal.  Eyes: . Pupils are equal, round, and reactive to light. Conjunctivae and EOM are normal Neck: Normal range of motion. Neck supple.  Cardiovascular: Normal rate and regular rhythm.   Pulmonary/Chest: Effort normal  and breath sounds without rales or wheezing.  Neurological: Pt is alert. Not confused , motor grossly intact Skin: Skin is warm. Rash with mult areas of erythema, NT to head adn neck Psychiatric: Pt behavior is normal. No agitation.     Assessment & Plan:

## 2014-10-12 NOTE — Assessment & Plan Note (Signed)
For derm referral, Mild to mod, for depomedrol IM, predpac asd, atarax prn,  to f/u any worsening symptoms or concerns

## 2014-10-20 NOTE — Anesthesia Preprocedure Evaluation (Signed)
Anesthesia Evaluation  Patient identified by MRN, date of birth, ID band Patient awake    Reviewed: Allergy & Precautions, NPO status , Patient's Chart, lab work & pertinent test results  History of Anesthesia Complications Negative for: history of anesthetic complications  Airway Mallampati: III  TM Distance: >3 FB Neck ROM: Full    Dental no notable dental hx. (+) Dental Advisory Given   Pulmonary shortness of breath, asthma , sleep apnea , COPD COPD inhaler and oxygen dependent, former smoker,  breath sounds clear to auscultation  Pulmonary exam normal       Cardiovascular Exercise Tolerance: Poor hypertension, Pt. on medications + Past MI, + Peripheral Vascular Disease and +CHF + dysrhythmias Atrial Fibrillation Rhythm:Regular Rate:Normal     Neuro/Psych PSYCHIATRIC DISORDERS Anxiety Depression negative neurological ROS     GI/Hepatic Neg liver ROS, hiatal hernia, GERD-  Medicated,  Endo/Other  diabetes, Type 2, Oral Hypoglycemic AgentsMorbid obesity  Renal/GU negative Renal ROS  negative genitourinary   Musculoskeletal  (+) Arthritis -, Osteoarthritis and on steriods ,  Fibromyalgia -  Abdominal (+) + obese,   Peds negative pediatric ROS (+)  Hematology negative hematology ROS (+)   Anesthesia Other Findings   Reproductive/Obstetrics negative OB ROS                             Anesthesia Physical Anesthesia Plan  ASA: IV  Anesthesia Plan: MAC   Post-op Pain Management:    Induction:   Airway Management Planned: Nasal Cannula  Additional Equipment:   Intra-op Plan:   Post-operative Plan:   Informed Consent: I have reviewed the patients History and Physical, chart, labs and discussed the procedure including the risks, benefits and alternatives for the proposed anesthesia with the patient or authorized representative who has indicated his/her understanding and acceptance.    Dental advisory given  Plan Discussed with: CRNA  Anesthesia Plan Comments:         Anesthesia Quick Evaluation

## 2014-10-21 ENCOUNTER — Encounter (HOSPITAL_COMMUNITY)
Admission: RE | Disposition: A | Discharge status: Home / Self Care | Payer: Self-pay | Source: Ambulatory Visit | Attending: Gastroenterology

## 2014-10-21 ENCOUNTER — Ambulatory Visit (HOSPITAL_COMMUNITY)
Admission: RE | Admit: 2014-10-21 | Discharge: 2014-10-21 | Disposition: A | Discharge status: Home / Self Care | Payer: Commercial Managed Care - HMO | Source: Ambulatory Visit | Attending: Gastroenterology | Admitting: Gastroenterology

## 2014-10-21 ENCOUNTER — Encounter (HOSPITAL_COMMUNITY): Payer: Self-pay | Admitting: *Deleted

## 2014-10-21 ENCOUNTER — Ambulatory Visit (HOSPITAL_COMMUNITY): Payer: Commercial Managed Care - HMO | Admitting: Anesthesiology

## 2014-10-21 DIAGNOSIS — J449 Chronic obstructive pulmonary disease, unspecified: Secondary | ICD-10-CM | POA: Diagnosis not present

## 2014-10-21 DIAGNOSIS — Z888 Allergy status to other drugs, medicaments and biological substances status: Secondary | ICD-10-CM | POA: Diagnosis not present

## 2014-10-21 DIAGNOSIS — E119 Type 2 diabetes mellitus without complications: Secondary | ICD-10-CM | POA: Insufficient documentation

## 2014-10-21 DIAGNOSIS — Z6841 Body Mass Index (BMI) 40.0 and over, adult: Secondary | ICD-10-CM | POA: Insufficient documentation

## 2014-10-21 DIAGNOSIS — J45909 Unspecified asthma, uncomplicated: Secondary | ICD-10-CM | POA: Diagnosis not present

## 2014-10-21 DIAGNOSIS — D123 Benign neoplasm of transverse colon: Secondary | ICD-10-CM

## 2014-10-21 DIAGNOSIS — G894 Chronic pain syndrome: Secondary | ICD-10-CM | POA: Insufficient documentation

## 2014-10-21 DIAGNOSIS — I10 Essential (primary) hypertension: Secondary | ICD-10-CM | POA: Diagnosis not present

## 2014-10-21 DIAGNOSIS — Z9981 Dependence on supplemental oxygen: Secondary | ICD-10-CM | POA: Diagnosis not present

## 2014-10-21 DIAGNOSIS — I4891 Unspecified atrial fibrillation: Secondary | ICD-10-CM | POA: Insufficient documentation

## 2014-10-21 DIAGNOSIS — Z1211 Encounter for screening for malignant neoplasm of colon: Secondary | ICD-10-CM | POA: Insufficient documentation

## 2014-10-21 DIAGNOSIS — M797 Fibromyalgia: Secondary | ICD-10-CM | POA: Diagnosis not present

## 2014-10-21 DIAGNOSIS — I252 Old myocardial infarction: Secondary | ICD-10-CM | POA: Insufficient documentation

## 2014-10-21 DIAGNOSIS — I509 Heart failure, unspecified: Secondary | ICD-10-CM | POA: Insufficient documentation

## 2014-10-21 DIAGNOSIS — Z87891 Personal history of nicotine dependence: Secondary | ICD-10-CM | POA: Diagnosis not present

## 2014-10-21 DIAGNOSIS — G4733 Obstructive sleep apnea (adult) (pediatric): Secondary | ICD-10-CM | POA: Insufficient documentation

## 2014-10-21 DIAGNOSIS — K5730 Diverticulosis of large intestine without perforation or abscess without bleeding: Secondary | ICD-10-CM | POA: Diagnosis not present

## 2014-10-21 DIAGNOSIS — Z86010 Personal history of colonic polyps: Secondary | ICD-10-CM

## 2014-10-21 DIAGNOSIS — K641 Second degree hemorrhoids: Secondary | ICD-10-CM | POA: Diagnosis not present

## 2014-10-21 DIAGNOSIS — I739 Peripheral vascular disease, unspecified: Secondary | ICD-10-CM | POA: Insufficient documentation

## 2014-10-21 DIAGNOSIS — F329 Major depressive disorder, single episode, unspecified: Secondary | ICD-10-CM | POA: Diagnosis not present

## 2014-10-21 DIAGNOSIS — Z7901 Long term (current) use of anticoagulants: Secondary | ICD-10-CM | POA: Diagnosis not present

## 2014-10-21 DIAGNOSIS — K648 Other hemorrhoids: Secondary | ICD-10-CM | POA: Diagnosis not present

## 2014-10-21 DIAGNOSIS — F419 Anxiety disorder, unspecified: Secondary | ICD-10-CM | POA: Diagnosis not present

## 2014-10-21 DIAGNOSIS — E785 Hyperlipidemia, unspecified: Secondary | ICD-10-CM | POA: Diagnosis not present

## 2014-10-21 DIAGNOSIS — K219 Gastro-esophageal reflux disease without esophagitis: Secondary | ICD-10-CM | POA: Insufficient documentation

## 2014-10-21 DIAGNOSIS — I429 Cardiomyopathy, unspecified: Secondary | ICD-10-CM | POA: Diagnosis not present

## 2014-10-21 DIAGNOSIS — K635 Polyp of colon: Secondary | ICD-10-CM | POA: Diagnosis not present

## 2014-10-21 DIAGNOSIS — Z860101 Personal history of adenomatous and serrated colon polyps: Secondary | ICD-10-CM

## 2014-10-21 HISTORY — PX: COLONOSCOPY WITH PROPOFOL: SHX5780

## 2014-10-21 LAB — GLUCOSE, CAPILLARY: Glucose-Capillary: 87 mg/dL (ref 70–99)

## 2014-10-21 SURGERY — COLONOSCOPY WITH PROPOFOL
Anesthesia: Monitor Anesthesia Care

## 2014-10-21 MED ORDER — MIDAZOLAM HCL 5 MG/5ML IJ SOLN
INTRAMUSCULAR | Status: DC | PRN
  Administered 2014-10-21 (×2): 0.5 mg via INTRAVENOUS
  Administered 2014-10-21: 1 mg via INTRAVENOUS
  Administered 2014-10-21: 2 mg via INTRAVENOUS

## 2014-10-21 MED ORDER — PROPOFOL 10 MG/ML IV BOLUS
INTRAVENOUS | Status: AC
  Filled 2014-10-21: qty 40

## 2014-10-21 MED ORDER — PROPOFOL 10 MG/ML IV BOLUS
INTRAVENOUS | Status: AC
  Filled 2014-10-21: qty 20

## 2014-10-21 MED ORDER — MIDAZOLAM HCL 2 MG/2ML IJ SOLN
INTRAMUSCULAR | Status: AC
  Filled 2014-10-21: qty 2

## 2014-10-21 MED ORDER — PROPOFOL INFUSION 10 MG/ML OPTIME
INTRAVENOUS | Status: DC | PRN
  Administered 2014-10-21: 180 ug/kg/min via INTRAVENOUS

## 2014-10-21 MED ORDER — LACTATED RINGERS IV SOLN
INTRAVENOUS | Status: DC | PRN
  Administered 2014-10-21: 09:00:00 via INTRAVENOUS

## 2014-10-21 MED ORDER — LIDOCAINE HCL 1 % IJ SOLN
INTRAMUSCULAR | Status: DC | PRN
  Administered 2014-10-21: 50 mg via INTRADERMAL

## 2014-10-21 MED ORDER — LIDOCAINE HCL (CARDIAC) 20 MG/ML IV SOLN
INTRAVENOUS | Status: AC
  Filled 2014-10-21: qty 5

## 2014-10-21 SURGICAL SUPPLY — 22 items

## 2014-10-21 NOTE — Anesthesia Postprocedure Evaluation (Signed)
  Anesthesia Post-op Note  Patient: [name]  Procedure(s) Performed: Procedure(s) (LRB): COLONOSCOPY WITH PROPOFOL (N/A)  Patient Location: PACU  Anesthesia Type: MAC  Level of Consciousness: awake and alert   Airway and Oxygen Therapy: Patient Spontanous Breathing  Post-op Pain: mild  Post-op Assessment: Post-op Vital signs reviewed, Patient's Cardiovascular Status Stable, Respiratory Function Stable, Patent Airway and No signs of Nausea or vomiting  Last Vitals:  Filed Vitals:   10/21/14 1057  BP: 136/62  Pulse: 66  Temp:   Resp: 14    Post-op Vital Signs: stable   Complications: No apparent anesthesia complications

## 2014-10-21 NOTE — Op Note (Signed)
[facility] [place], [id number]   COLONOSCOPY PROCEDURE REPORT PATIENT: [name], [name]  MR#: [id number] BIRTHDATE: [birth year] , 37  yrs. old GENDER: male ENDOSCOPIST: Ladene Artist, MD, [facility] PROCEDURE DATE:  10/21/2014 PROCEDURE:   Colonoscopy with snare polypectomy First Screening Colonoscopy - Avg.  risk and is 50 yrs.  old or older - No.  Prior Negative Screening - Now for repeat screening. N/A  History of Adenoma - Now for follow-up colonoscopy & has been > or = to 3 yrs.  Yes hx of adenoma.  Has been 3 or more years since last colonoscopy.  Polyps Removed Today? Yes. ASA CLASS:   Class IV INDICATIONS:surveillance colonoscopy based on a history of adenomatous colonic polyp(s). MEDICATIONS: Monitored anesthesia care and Per Anesthesia DESCRIPTION OF PROCEDURE:   After the risks benefits and alternatives of the procedure were thoroughly explained, informed consent was obtained.  The digital rectal exam revealed no abnormalities of the rectum.   The Pentax Ped Colon [id number] endoscope was introduced through the anus and advanced to the ascending colon due to tortous/redundant colon. No adverse events experienced.   Limited by a tortuous and redundant colon.   The quality of the prep was good, using MoviPrep  The instrument was then slowly withdrawn as the colon was fully examined.  COLON FINDINGS: A sessile polyp measuring 10 mm in size was found in the transverse colon.  A polypectomy was performed using snare cautery. The resection was complete, the polyp tissue was completely retrieved and sent to histology.   A sessile polyp measuring 7 mm in size was found in the transverse colon.  A polypectomy was performed with a cold snare.  The resection was complete, the polyp tissue was completely retrieved and sent to histology.  There was moderate diverticulosis noted in the sigmoid colon and descending colon.   The examination was otherwise  normal. Retroflexed views revealed internal Grade II hemorrhoids. The time to cecum=10 minutes 00 seconds.  Withdrawal time=11 minutes 00 seconds.  The scope was withdrawn and the procedure completed. COMPLICATIONS: There were no immediate complications.  ENDOSCOPIC IMPRESSION: 1.   Sessile polyp in the transverse colon; polypectomy performed using snare cautery 2.   Sessile polyp in the transverse colon; polypectomy performed with a cold snare 3.   Moderate diverticulosis in the sigmoid colon and descending colon 4.   Grade Il internal hemorrhoids  RECOMMENDATIONS: 1.  Hold Aspirin and all other NSAIDS for 2 weeks. 2.  Await pathology results 3.  High fiber diet with liberal fluid intake. 4.  Resume Coumadin (warfarin) tomorrow and have your PT/INR checked within 1 week. 5.  Repeat Colonoscopy in 3 years at a tertiary center  eSigned:  Ladene Artist, MD, [facility] 10/21/2014 10:31 AM

## 2014-10-21 NOTE — Interval H&P Note (Signed)
History and Physical Interval Note:  10/21/2014 9:25 AM  [name]  has presented today for surgery, with the diagnosis of HX OF COLON POLYPS   The various methods of treatment have been discussed with the patient and family. After consideration of risks, benefits and other options for treatment, the patient has consented to  Procedure(s): COLONOSCOPY WITH PROPOFOL (N/A) as a surgical intervention .  The patient's history has been reviewed, patient examined, no change in status, stable for surgery.  I have reviewed the patient's chart and labs.  Questions were answered to the patient's satisfaction.     Pricilla Riffle. Fuller Plan MD

## 2014-10-21 NOTE — H&P (View-Only) (Signed)
Subjective:    Patient ID: [name], male    DOB: 04-May-1956, 59 y.o.   MRN: [id number]  HPI  Here with flare or recurring head and neck rash with marked itching, hx of atopic dermatitis, has been referred to derm in past but he deferred to have hemorrhoid tx instead at the time.  Usually need steroid shot, predpac and atarax for control.  Pt denies chest pain, increased sob or doe, wheezing, orthopnea, PND, increased LE swelling, palpitations, dizziness or syncope. Pt denies new neurological symptoms such as new headache, or facial or extremity weakness or numbness Past Medical History  Diagnosis Date  . Chronic pain syndrome   . DYSKINESIA, ESOPHAGUS   . FIBROMYALGIA   . HYPERLIPIDEMIA     but unable to take any meds  . INSOMNIA     takes Ambien nightly  . LOW BACK PAIN     DDD and Herniated disc  . ORCHITIS   . Rash and other nonspecific skin eruption 04/12/2009    no cause found saw dermatologists x 2 and allergist  . Atrial fibrillation     takes Coumadin daily along with Pacerone  . Cardiomyopathy     Myoview with prior infarct- 2012; EF 25% TEE July 2013  . H/O hiatal hernia   . Myocardial infarction     hx of MI  . ANXIETY     takes Xanax nightly  . Muscle spasm     takes Zanaflex daily as needed  . ASTHMA     Albuterol prn and Advair daily;also takes Prednisone daily  . GERD     takes Omeprazole daily  . DIABETES MELLITUS, TYPE II     takes Metformin daily  . HYPERTENSION     takes Lisinopril daily  . Peripheral edema     takes Lasix daily  . CHF (congestive heart failure)     takes Furosemide daily  . COPD (chronic obstructive pulmonary disease)   . O2 dependent     uses oxygen 2 and 1/2 liters per [place] at hs  . Shortness of breath     lying,sitting,exertion  . SLEEP APNEA, OBSTRUCTIVE     doesn't use CPAP  . Peripheral neuropathy   . Fibromyalgia   . Arthritis   . Joint pain   . Joint swelling   . Chronic constipation     takes OTC stool softener    . History of colon polyps   . Diverticulitis   . Urinary urgency   . Nocturia   . History of kidney stones   . Glaucoma   . DEPRESSION     takes Zoloft and Doxepin daily  . Anemia     supposed to be taking Vit B but doesn't   Past Surgical History  Procedure Laterality Date  . Tonsillectomy    . Acne cyst removal      2 on back   . Cardioversion  04/18/2012    Procedure: CARDIOVERSION;  Surgeon: Fay Records, MD;  Location: [place];  Service: Cardiovascular;  Laterality: N/A;  . Tee without cardioversion  04/25/2012    Procedure: TRANSESOPHAGEAL ECHOCARDIOGRAM (TEE);  Surgeon: Thayer Headings, MD;  Location: [place];  Service: Cardiovascular;  Laterality: N/A;  . Cardioversion  04/25/2012    Procedure: CARDIOVERSION;  Surgeon: Thayer Headings, MD;  Location: [place];  Service: Cardiovascular;  Laterality: N/A;  . Cardioversion  04/25/2012    Procedure: CARDIOVERSION;  Surgeon: Fay Records, MD;  Location:  [place] [facility];  Service: Cardiovascular;  Laterality: N/A;  . Cardioversion  05/09/2012    Procedure: CARDIOVERSION;  Surgeon: Sherren Mocha, MD;  Location: [facility];  Service: Cardiovascular;  Laterality: N/A;  changed from crenshaw to cooper by trish/leone-endo  . Colonoscopy    . Esophagogastroduodenoscopy    . Total knee arthroplasty Right 06/15/2014    Procedure: TOTAL KNEE ARTHROPLASTY;  Surgeon: Renette Butters, MD;  Location: [place];  Service: Orthopedics;  Laterality: Right;    reports that he quit smoking about 6 years ago. His smoking use included Cigarettes. He has a 60 pack-year smoking history. He has never used smokeless tobacco. He reports that he does not drink alcohol or use illicit drugs. family history includes Allergies in his mother; Asthma in his maternal grandmother and mother; COPD in his mother; Colon polyps in his mother. There is no history of Colon cancer. Allergies  Allergen Reactions  . Simvastatin     REACTION: severe leg cramps  . Statins Other (See  Comments)    myalgia   Current Outpatient Prescriptions on File Prior to Visit  Medication Sig Dispense Refill  . albuterol (PROVENTIL HFA;VENTOLIN HFA) 108 (90 BASE) MCG/ACT inhaler Inhale 2 puffs into the lungs 4 (four) times daily.    [name] ALPRAZolam (XANAX) 0.5 MG tablet TAKE 1 TABLET BY MOUTH TWICE A DAY AS NEEDED FOR ANXIETY 60 tablet 2  . ALPRAZolam (XANAX) 0.5 MG tablet Take 0.5 mg by mouth 2 (two) times daily as needed for anxiety.    [name] amiodarone (PACERONE) 100 MG tablet Take 1 tablet (100 mg total) by mouth daily. 90 tablet 0  . carisoprodol (SOMA) 350 MG tablet TAKE 1 TABLET BY MOUTH 3 TIMES A DAY AS NEEDED 90 tablet 2  . carisoprodol (SOMA) 350 MG tablet Take 350 mg by mouth 3 (three) times daily.    . clobetasol cream (TEMOVATE) 3.22 % Apply 1 application topically 2 (two) times daily.    . digoxin (LANOXIN) 0.125 MG tablet TAKE 1 TABLET BY MOUTH EVERY DAY 30 tablet 1  . digoxin (LANOXIN) 0.125 MG tablet Take 0.125 mg by mouth every morning.    . diltiazem 2 % GEL Apply 1 application topically 3 (three) times daily. 30 g 1  . doxepin (SINEQUAN) 25 MG capsule Take 25 mg by mouth 2 (two) times daily.    [name] doxepin (SINEQUAN) 25 MG capsule Take 1 capsule (25 mg total) by mouth 2 (two) times daily. 180 capsule 1  . Fluticasone-Salmeterol (ADVAIR) 250-50 MCG/DOSE AEPB Inhale 1-2 puffs into the lungs daily.    . furosemide (LASIX) 40 MG tablet Take 40 mg by mouth 2 (two) times daily.    . hydrocortisone (ANUSOL-HC) 2.5 % rectal cream Place 1 application rectally 3 (three) times daily. 30 g 1  . hydrocortisone (ANUSOL-HC) 25 MG suppository Place 1 suppository (25 mg total) rectally every 12 (twelve) hours. 12 suppository 1  . hydrocortisone (ANUSOL-HC) 25 MG suppository PLACE 1 SUPPOSITORY RECTALLY EVERY 12 HOURS. 12 suppository 1  . hydrocortisone 2.5 % cream Apply topically 2 (two) times daily. 30 g 3  . hydrOXYzine (ATARAX/VISTARIL) 25 MG tablet Take 1 tablet (25 mg total) by mouth 3  (three) times daily as needed. For itching 30 tablet 1  . ibuprofen (ADVIL,MOTRIN) 200 MG tablet Take 200 mg by mouth every 6 (six) hours as needed for mild pain.     [name] lisinopril (PRINIVIL,ZESTRIL) 5 MG tablet TAKE 1 TABLET (5 MG TOTAL) BY MOUTH DAILY. [place]  tablet 3  . lisinopril (PRINIVIL,ZESTRIL) 5 MG tablet Take 5 mg by mouth daily with lunch.    . Loratadine 10 MG CAPS Take 1 capsule by mouth daily.     . Magnesium Hydroxide (MILK OF MAGNESIA PO) Take 10 mLs by mouth 2 (two) times daily as needed.     . magnesium hydroxide (MILK OF MAGNESIA) 400 MG/5ML suspension Take 30 mLs by mouth daily as needed for mild constipation.    . metFORMIN (GLUCOPHAGE) 500 MG tablet Take 500 mg by mouth 2 (two) times daily with a meal.    . MOVIPREP 100 G SOLR Take 1 kit (200 g total) by mouth once. 1 kit 0  . omeprazole (PRILOSEC) 20 MG capsule Take 20 mg by mouth 2 (two) times daily before a meal.    . ondansetron (ZOFRAN) 4 MG tablet Take 1 tablet (4 mg total) by mouth every 8 (eight) hours as needed for nausea. 60 tablet 0  . phenol (CHLORASEPTIC) 1.4 % LIQD Use as directed 1 spray in the mouth or throat as needed for throat irritation / pain.     . polyethylene glycol (MIRALAX / GLYCOLAX) packet Take 17 g by mouth daily.    . polyethylene glycol powder (GLYCOLAX/MIRALAX) powder Take 255 g by mouth daily. 255 g 3  . predniSONE (DELTASONE) 5 MG tablet Try 2 daily x 7 days, then one daily x 7 days, then one every other day (Patient taking differently: Try 2 daily x 7 days, then one daily x 7 days, then one every other day, will finish before 10-21-14 procedure) 30 tablet 2  . sertraline (ZOLOFT) 100 MG tablet Take 2 tablets (200 mg total) by mouth daily. (Patient taking differently: Take 200 mg by mouth at bedtime. ) 180 tablet 3  . tiZANidine (ZANAFLEX) 2 MG tablet Take 2 mg by mouth 3 (three) times daily as needed for muscle spasms.    . Tolnaftate POWD Apply 1 application topically daily.     . traZODone  (DESYREL) 100 MG tablet Take 50 mg by mouth at bedtime.    [name] warfarin (COUMADIN) 5 MG tablet Take 0.5-1 tablets (2.5-5 mg total) by mouth daily. Take half a tab (2.5 mg) on M,W,F and all other days take 1 tab (5 mg) 30 tablet 3  . witch hazel-glycerin (TUCKS) pad Apply 1 application topically as needed for itching or irritation.     [name] zolpidem (AMBIEN) 10 MG tablet Take 10 mg by mouth at bedtime.    . [DISCONTINUED] atorvastatin (LIPITOR) 20 MG tablet Take 1 tablet (20 mg total) by mouth daily. 30 tablet 11  . [DISCONTINUED] gabapentin (NEURONTIN) 300 MG capsule Take 1 capsule (300 mg total) by mouth 3 (three) times daily. 90 capsule 5   No current facility-administered medications on file prior to visit.    Review of Systems  Constitutional: Negative for unusual diaphoresis or other sweats  HENT: Negative for ringing in ear Eyes: Negative for double vision or worsening visual disturbance.  Respiratory: Negative for choking and stridor.   Gastrointestinal: Negative for vomiting or other signifcant bowel change Genitourinary: Negative for hematuria or decreased urine volume.  Musculoskeletal: Negative for other MSK pain or swelling Skin: Negative for color change and worsening wound.  Neurological: Negative for tremors and numbness other than noted  Psychiatric/Behavioral: Negative for decreased concentration or agitation other than above       Objective:   Physical Exam BP 112/68 mmHg  Pulse 77  Temp(Src) 98.2 F (36.8  C) (Oral)  Ht _0  (1.778 m)  Wt 331 lb 2 oz (150.197 kg)  BMI 47.51 kg/m2  SpO2 96% VS noted, non toxic Constitutional: Pt appears well-developed, well-nourished.  HENT: Head: NCAT.  Right Ear: External ear normal.  Left Ear: External ear normal.  Eyes: . Pupils are equal, round, and reactive to light. Conjunctivae and EOM are normal Neck: Normal range of motion. Neck supple.  Cardiovascular: Normal rate and regular rhythm.   Pulmonary/Chest: Effort normal  and breath sounds without rales or wheezing.  Neurological: Pt is alert. Not confused , motor grossly intact Skin: Skin is warm. Rash with mult areas of erythema, NT to head adn neck Psychiatric: Pt behavior is normal. No agitation.     Assessment & Plan:

## 2014-10-21 NOTE — Transfer of Care (Signed)
Immediate Anesthesia Transfer of Care Note  Patient: [name]  Procedure(s) Performed: Procedure(s): COLONOSCOPY WITH PROPOFOL (N/A)  Patient Location: PACU and Endoscopy Unit  Anesthesia Type:MAC  Level of Consciousness: awake, alert , oriented and patient cooperative  Airway & Oxygen Therapy: Patient Spontanous Breathing and Patient connected to face mask oxygen  Post-op Assessment: Report given to PACU RN, Post -op Vital signs reviewed and stable and Patient moving all extremities  Post vital signs: Reviewed and stable  Complications: No apparent anesthesia complications

## 2014-10-22 ENCOUNTER — Encounter: Payer: Self-pay | Admitting: Gastroenterology

## 2014-10-22 ENCOUNTER — Encounter (HOSPITAL_COMMUNITY): Payer: Self-pay | Admitting: Gastroenterology

## 2014-10-24 ENCOUNTER — Other Ambulatory Visit: Payer: Self-pay | Admitting: Internal Medicine

## 2014-10-25 ENCOUNTER — Ambulatory Visit: Payer: Commercial Managed Care - HMO | Admitting: Gastroenterology

## 2014-10-27 ENCOUNTER — Ambulatory Visit (INDEPENDENT_AMBULATORY_CARE_PROVIDER_SITE_OTHER): Payer: Commercial Managed Care - HMO | Admitting: Family Medicine

## 2014-10-27 ENCOUNTER — Ambulatory Visit (INDEPENDENT_AMBULATORY_CARE_PROVIDER_SITE_OTHER): Payer: Commercial Managed Care - HMO | Admitting: Internal Medicine

## 2014-10-27 ENCOUNTER — Encounter: Payer: Self-pay | Admitting: Internal Medicine

## 2014-10-27 VITALS — BP 124/60 | HR 84 | Temp 97.8°F | Ht 69.0 in | Wt 338.0 lb

## 2014-10-27 DIAGNOSIS — I10 Essential (primary) hypertension: Secondary | ICD-10-CM

## 2014-10-27 DIAGNOSIS — L209 Atopic dermatitis, unspecified: Secondary | ICD-10-CM | POA: Diagnosis not present

## 2014-10-27 DIAGNOSIS — E119 Type 2 diabetes mellitus without complications: Secondary | ICD-10-CM | POA: Diagnosis not present

## 2014-10-27 DIAGNOSIS — Z5181 Encounter for therapeutic drug level monitoring: Secondary | ICD-10-CM

## 2014-10-27 DIAGNOSIS — I4891 Unspecified atrial fibrillation: Secondary | ICD-10-CM

## 2014-10-27 LAB — POCT INR: INR: 1

## 2014-10-27 MED ORDER — PREDNISONE 10 MG PO TABS: ORAL_TABLET | ORAL | Status: DC

## 2014-10-27 MED ORDER — METHYLPREDNISOLONE ACETATE 80 MG/ML IJ SUSP: 80.0000 mg | Freq: Once | INTRAMUSCULAR | Status: DC

## 2014-10-27 MED ORDER — METHYLPREDNISOLONE ACETATE 80 MG/ML IJ SUSP
80.0000 mg | Freq: Once | INTRAMUSCULAR | Status: AC
  Administered 2014-10-27: 80 mg via INTRAMUSCULAR

## 2014-10-27 NOTE — Patient Instructions (Signed)
You had the steroid shot today  Please take all new medication as prescribed - the prednisone  Please continue all other medications as before, and refills have been done if requested.  Please have the pharmacy call with any other refills you may need.  Please keep your appointments with your specialists as you may have planned  You will be contacted regarding the referral for: Dermatology

## 2014-10-27 NOTE — Progress Notes (Signed)
Pre visit review using our clinic review tool, if applicable. No additional management support is needed unless otherwise documented below in the visit note. 

## 2014-10-28 ENCOUNTER — Telehealth: Payer: Self-pay

## 2014-10-28 NOTE — Telephone Encounter (Signed)
Pt was seen at [facility] yesterday/treated

## 2014-10-28 NOTE — Telephone Encounter (Signed)
Patient left a message that rash still a problem on face/neck and prednisone is gone and he is not sure what to do.  States he cannot afford an OV at this time, advise please.

## 2014-11-03 ENCOUNTER — Other Ambulatory Visit: Payer: Self-pay | Admitting: Internal Medicine

## 2014-11-04 NOTE — Telephone Encounter (Signed)
Done hardcopy to robin  

## 2014-11-04 NOTE — Telephone Encounter (Signed)
Faxed hardcopy for Alprazolam to [facility] [address] [place] 

## 2014-11-04 NOTE — Assessment & Plan Note (Signed)
stable overall by history and exam, recent data reviewed with pt, and pt to continue medical treatment as before,  to f/u any worsening symptoms or concerns BP Readings from Last 3 Encounters:  10/27/14 124/60  10/21/14 136/62  10/12/14 112/68

## 2014-11-04 NOTE — Progress Notes (Signed)
Subjective:    Patient ID: [name], male    DOB: May 25, 1956, 59 y.o.   MRN: [id number]  HPI  Here with 3 days of nonstop diffuse overall body burning itching , and flare of previous rash c/w atopic dermatitis, improved with steroid before but now returned.  Has appt feb 17 with dermatology, but cant wait until then for further tx.  Nothing else seems to make better or worse. OTC antihist not helping this time. Pt denies chest pain, increased sob or doe, wheezing, orthopnea, PND, increased LE swelling, palpitations, dizziness or syncope.  Pt denies new neurological symptoms such as new headache, or facial or extremity weakness or numbness   Pt denies polydipsia, polyuria,  Past Medical History  Diagnosis Date  . Chronic pain syndrome   . DYSKINESIA, ESOPHAGUS   . FIBROMYALGIA   . HYPERLIPIDEMIA     but unable to take any meds  . INSOMNIA     takes Ambien nightly  . LOW BACK PAIN     DDD and Herniated disc  . ORCHITIS   . Rash and other nonspecific skin eruption 04/12/2009    no cause found saw dermatologists x 2 and allergist  . Atrial fibrillation     takes Coumadin daily along with Pacerone  . Cardiomyopathy     Myoview with prior infarct- 2012; EF 25% TEE July 2013  . H/O hiatal hernia   . Myocardial infarction     hx of MI  . ANXIETY     takes Xanax nightly  . Muscle spasm     takes Zanaflex daily as needed  . ASTHMA     Albuterol prn and Advair daily;also takes Prednisone daily  . GERD     takes Omeprazole daily  . DIABETES MELLITUS, TYPE II     takes Metformin daily  . HYPERTENSION     takes Lisinopril daily  . Peripheral edema     takes Lasix daily  . CHF (congestive heart failure)     takes Furosemide daily  . COPD (chronic obstructive pulmonary disease)   . O2 dependent     uses oxygen 2 and 1/2 liters per [place] at hs  . Shortness of breath     lying,sitting,exertion  . SLEEP APNEA, OBSTRUCTIVE     doesn't use CPAP  . Peripheral neuropathy   .  Fibromyalgia   . Arthritis   . Joint pain   . Joint swelling   . Chronic constipation     takes OTC stool softener  . History of colon polyps   . Diverticulitis   . Urinary urgency   . Nocturia   . History of kidney stones   . Glaucoma   . DEPRESSION     takes Zoloft and Doxepin daily  . Anemia     supposed to be taking Vit B but doesn't   Past Surgical History  Procedure Laterality Date  . Tonsillectomy    . Acne cyst removal      2 on back   . Cardioversion  04/18/2012    Procedure: CARDIOVERSION;  Surgeon: Fay Records, MD;  Location: [place];  Service: Cardiovascular;  Laterality: N/A;  . Tee without cardioversion  04/25/2012    Procedure: TRANSESOPHAGEAL ECHOCARDIOGRAM (TEE);  Surgeon: Thayer Headings, MD;  Location: [name];  Service: Cardiovascular;  Laterality: N/A;  . Cardioversion  04/25/2012    Procedure: CARDIOVERSION;  Surgeon: Thayer Headings, MD;  Location: [place];  Service: Cardiovascular;  Laterality:  N/A;  . Cardioversion  04/25/2012    Procedure: CARDIOVERSION;  Surgeon: Fay Records, MD;  Location: [place];  Service: Cardiovascular;  Laterality: N/A;  . Cardioversion  05/09/2012    Procedure: CARDIOVERSION;  Surgeon: Sherren Mocha, MD;  Location: [place];  Service: Cardiovascular;  Laterality: N/A;  changed from crenshaw to cooper by trish/leone-endo  . Colonoscopy    . Esophagogastroduodenoscopy    . Total knee arthroplasty Right 06/15/2014    Procedure: TOTAL KNEE ARTHROPLASTY;  Surgeon: Renette Butters, MD;  Location: [place];  Service: Orthopedics;  Laterality: Right;  . Colonoscopy with propofol N/A 10/21/2014    Procedure: COLONOSCOPY WITH PROPOFOL;  Surgeon: Ladene Artist, MD;  Location: [facility];  Service: Endoscopy;  Laterality: N/A;    reports that he quit smoking about 7 years ago. His smoking use included Cigarettes. He has a 60 pack-year smoking history. He has never used smokeless tobacco. He reports that he does not drink alcohol or use  illicit drugs. family history includes Allergies in his mother; Asthma in his maternal grandmother and mother; COPD in his mother; Colon polyps in his mother. There is no history of Colon cancer. Allergies  Allergen Reactions  . Simvastatin     REACTION: severe leg cramps  . Statins Other (See Comments)    myalgia   Current Outpatient Prescriptions on File Prior to Visit  Medication Sig Dispense Refill  . albuterol (PROVENTIL HFA;VENTOLIN HFA) 108 (90 BASE) MCG/ACT inhaler Inhale 2 puffs into the lungs 4 (four) times daily.    [name] ALPRAZolam (XANAX) 0.5 MG tablet TAKE 1 TABLET BY MOUTH TWICE A DAY AS NEEDED FOR ANXIETY 60 tablet 2  . ALPRAZolam (XANAX) 0.5 MG tablet Take 0.5 mg by mouth 2 (two) times daily as needed for anxiety.    [name] amiodarone (PACERONE) 100 MG tablet Take 1 tablet (100 mg total) by mouth daily. 90 tablet 0  . carisoprodol (SOMA) 350 MG tablet TAKE 1 TABLET BY MOUTH 3 TIMES A DAY AS NEEDED 90 tablet 2  . carisoprodol (SOMA) 350 MG tablet Take 350 mg by mouth 3 (three) times daily.    . clobetasol cream (TEMOVATE) 2.99 % Apply 1 application topically 2 (two) times daily.    . digoxin (LANOXIN) 0.125 MG tablet TAKE 1 TABLET BY MOUTH EVERY DAY 30 tablet 1  . digoxin (LANOXIN) 0.125 MG tablet Take 0.125 mg by mouth every morning.    . diltiazem 2 % GEL Apply 1 application topically 3 (three) times daily. 30 g 1  . doxepin (SINEQUAN) 25 MG capsule Take 25 mg by mouth 2 (two) times daily.    [name] doxepin (SINEQUAN) 25 MG capsule Take 1 capsule (25 mg total) by mouth 2 (two) times daily. 180 capsule 1  . Fluticasone-Salmeterol (ADVAIR) 250-50 MCG/DOSE AEPB Inhale 1-2 puffs into the lungs daily.    . furosemide (LASIX) 40 MG tablet Take 40 mg by mouth 2 (two) times daily.    . hydrocortisone (ANUSOL-HC) 2.5 % rectal cream Place 1 application rectally 3 (three) times daily. 30 g 1  . hydrocortisone (ANUSOL-HC) 25 MG suppository Place 1 suppository (25 mg total) rectally every 12  (twelve) hours. 12 suppository 1  . hydrocortisone (ANUSOL-HC) 25 MG suppository PLACE 1 SUPPOSITORY RECTALLY EVERY 12 HOURS. 12 suppository 1  . hydrocortisone 2.5 % cream Apply topically 2 (two) times daily. 30 g 3  . hydrOXYzine (ATARAX/VISTARIL) 25 MG tablet Take 1 tablet (25 mg total) by mouth 3 (three) times  daily as needed. For itching 30 tablet 1  . ibuprofen (ADVIL,MOTRIN) 200 MG tablet Take 200 mg by mouth every 6 (six) hours as needed for mild pain.     [name] lisinopril (PRINIVIL,ZESTRIL) 5 MG tablet TAKE 1 TABLET (5 MG TOTAL) BY MOUTH DAILY. 30 tablet 3  . lisinopril (PRINIVIL,ZESTRIL) 5 MG tablet Take 5 mg by mouth daily with lunch.    . Loratadine 10 MG CAPS Take 1 capsule by mouth daily.     . Magnesium Hydroxide (MILK OF MAGNESIA PO) Take 10 mLs by mouth 2 (two) times daily as needed.     . magnesium hydroxide (MILK OF MAGNESIA) 400 MG/5ML suspension Take 30 mLs by mouth daily as needed for mild constipation.    . metFORMIN (GLUCOPHAGE) 500 MG tablet Take 500 mg by mouth 2 (two) times daily with a meal.    . MOVIPREP 100 G SOLR Take 1 kit (200 g total) by mouth once. 1 kit 0  . omeprazole (PRILOSEC) 20 MG capsule Take 20 mg by mouth 2 (two) times daily before a meal.    . ondansetron (ZOFRAN) 4 MG tablet Take 1 tablet (4 mg total) by mouth every 8 (eight) hours as needed for nausea. 60 tablet 0  . phenol (CHLORASEPTIC) 1.4 % LIQD Use as directed 1 spray in the mouth or throat as needed for throat irritation / pain.     . polyethylene glycol (MIRALAX / GLYCOLAX) packet Take 17 g by mouth daily.    . polyethylene glycol powder (GLYCOLAX/MIRALAX) powder Take 255 g by mouth daily. 255 g 3  . predniSONE (DELTASONE) 10 MG tablet 3 tabs by mouth per day for 3 days,2tabs per day for 3 days,1tab per day for 3 days 18 tablet 0  . predniSONE (DELTASONE) 5 MG tablet Try 2 daily x 7 days, then one daily x 7 days, then one every other day (Patient taking differently: Try 2 daily x 7 days, then one  daily x 7 days, then one every other day, will finish before 10-21-14 procedure) 30 tablet 2  . sertraline (ZOLOFT) 100 MG tablet Take 2 tablets (200 mg total) by mouth daily. (Patient taking differently: Take 200 mg by mouth at bedtime. ) 180 tablet 3  . tiZANidine (ZANAFLEX) 2 MG tablet Take 2 mg by mouth 3 (three) times daily as needed for muscle spasms.    . Tolnaftate POWD Apply 1 application topically daily.     . traZODone (DESYREL) 100 MG tablet Take 50 mg by mouth at bedtime.    [name] warfarin (COUMADIN) 5 MG tablet Take 0.5-1 tablets (2.5-5 mg total) by mouth daily. Take half a tab (2.5 mg) on M,W,F and all other days take 1 tab (5 mg) 30 tablet 3  . witch hazel-glycerin (TUCKS) pad Apply 1 application topically as needed for itching or irritation.     [name] zolpidem (AMBIEN) 10 MG tablet Take 10 mg by mouth at bedtime.    . [DISCONTINUED] atorvastatin (LIPITOR) 20 MG tablet Take 1 tablet (20 mg total) by mouth daily. 30 tablet 11  . [DISCONTINUED] gabapentin (NEURONTIN) 300 MG capsule Take 1 capsule (300 mg total) by mouth 3 (three) times daily. 90 capsule 5   No current facility-administered medications on file prior to visit.   Review of Systems  Constitutional: Negative for unusual diaphoresis or other sweats  HENT: Negative for ringing in ear Eyes: Negative for double vision or worsening visual disturbance.  Respiratory: Negative for choking and stridor.  Gastrointestinal: Negative for vomiting or other signifcant bowel change Genitourinary: Negative for hematuria or decreased urine volume.  Musculoskeletal: Negative for other MSK pain or swelling Skin: Negative for color change and worsening wound.  Neurological: Negative for tremors and numbness other than noted  Psychiatric/Behavioral: Negative for decreased concentration or agitation other than above       Objective:   Physical Exam BP 124/60 mmHg  Pulse 84  Temp(Src) 97.8 F (36.6 C) (Oral)  Ht 5' 9"  (1.753 m)  Wt 338 lb  (153.316 kg)  BMI 49.89 kg/m2 VS noted,  Constitutional: Pt appears well-developed, well-nourished.  HENT: Head: NCAT.  Right Ear: External ear normal.  Left Ear: External ear normal.  Eyes: . Pupils are equal, round, and reactive to light. Conjunctivae and EOM are normal Neck: Normal range of motion. Neck supple.  Cardiovascular: Normal rate and regular rhythm.   Pulmonary/Chest: Effort normal and breath sounds without rales or wheezing.  Neurological: Pt is alert. Not confused , motor grossly intact Skin: Skin is warm. + rash nontender areas erythema to torso and extremities Psychiatric: Pt behavior is normal. No agitation.     Assessment & Plan:

## 2014-11-04 NOTE — Assessment & Plan Note (Addendum)
Mild to mod flare, for depomedrol IM, then predpac asd,  to f/u any worsening symptoms or concerns, refer dermatology

## 2014-11-04 NOTE — Assessment & Plan Note (Signed)
stable overall by history and exam, recent data reviewed with pt, and pt to continue medical treatment as before,  to f/u any worsening symptoms or concerns Lab Results  Component Value Date   HGBA1C 5.9 06/29/2014   Pt to call for onset polys or cbg > 200 with steroid tx

## 2014-11-08 ENCOUNTER — Other Ambulatory Visit: Payer: Self-pay | Admitting: Physical Medicine & Rehabilitation

## 2014-11-09 ENCOUNTER — Other Ambulatory Visit: Payer: Self-pay | Admitting: Internal Medicine

## 2014-11-09 ENCOUNTER — Encounter: Payer: Commercial Managed Care - HMO | Admitting: Physical Medicine & Rehabilitation

## 2014-11-12 ENCOUNTER — Other Ambulatory Visit: Payer: Self-pay | Admitting: Internal Medicine

## 2014-11-12 DIAGNOSIS — G4733 Obstructive sleep apnea (adult) (pediatric): Secondary | ICD-10-CM | POA: Diagnosis not present

## 2014-11-13 ENCOUNTER — Other Ambulatory Visit: Payer: Self-pay | Admitting: Internal Medicine

## 2014-11-14 ENCOUNTER — Other Ambulatory Visit: Payer: Self-pay | Admitting: Internal Medicine

## 2014-11-16 ENCOUNTER — Other Ambulatory Visit: Payer: Self-pay

## 2014-11-16 NOTE — Telephone Encounter (Signed)
Done hardcopy to robin  

## 2014-11-17 ENCOUNTER — Other Ambulatory Visit: Payer: Self-pay | Admitting: Internal Medicine

## 2014-11-17 ENCOUNTER — Ambulatory Visit (INDEPENDENT_AMBULATORY_CARE_PROVIDER_SITE_OTHER): Payer: Commercial Managed Care - HMO | Admitting: General Practice

## 2014-11-17 DIAGNOSIS — Z96651 Presence of right artificial knee joint: Secondary | ICD-10-CM | POA: Diagnosis not present

## 2014-11-17 DIAGNOSIS — Z5181 Encounter for therapeutic drug level monitoring: Secondary | ICD-10-CM

## 2014-11-17 DIAGNOSIS — I4891 Unspecified atrial fibrillation: Secondary | ICD-10-CM | POA: Diagnosis not present

## 2014-11-17 LAB — POCT INR: INR: 4.4

## 2014-11-17 NOTE — Progress Notes (Signed)
Agree with plan 

## 2014-11-17 NOTE — Telephone Encounter (Signed)
Faxed hardcopy for [name] to [place]

## 2014-11-17 NOTE — Progress Notes (Signed)
Pre visit review using our clinic review tool, if applicable. No additional management support is needed unless otherwise documented below in the visit note. 

## 2014-11-18 ENCOUNTER — Other Ambulatory Visit: Payer: Self-pay | Admitting: Internal Medicine

## 2014-11-18 ENCOUNTER — Other Ambulatory Visit: Payer: Self-pay | Admitting: General Practice

## 2014-11-18 MED ORDER — WARFARIN SODIUM 5 MG PO TABS: ORAL_TABLET | ORAL | Status: DC

## 2014-11-19 NOTE — Telephone Encounter (Signed)
Done hardcopy to robin  

## 2014-11-19 NOTE — Telephone Encounter (Signed)
Faxed hardcopy for Zolpidem to [facility]

## 2014-11-24 ENCOUNTER — Encounter: Payer: Self-pay | Admitting: Physical Medicine & Rehabilitation

## 2014-11-24 ENCOUNTER — Encounter
Payer: Commercial Managed Care - HMO | Attending: Physical Medicine & Rehabilitation | Admitting: Physical Medicine & Rehabilitation

## 2014-11-24 VITALS — BP 115/70 | HR 135 | Resp 16

## 2014-11-24 DIAGNOSIS — M5134 Other intervertebral disc degeneration, thoracic region: Secondary | ICD-10-CM | POA: Diagnosis not present

## 2014-11-24 DIAGNOSIS — M47817 Spondylosis without myelopathy or radiculopathy, lumbosacral region: Secondary | ICD-10-CM

## 2014-11-24 DIAGNOSIS — G8929 Other chronic pain: Secondary | ICD-10-CM | POA: Insufficient documentation

## 2014-11-24 DIAGNOSIS — E669 Obesity, unspecified: Secondary | ICD-10-CM | POA: Diagnosis not present

## 2014-11-24 DIAGNOSIS — M170 Bilateral primary osteoarthritis of knee: Secondary | ICD-10-CM | POA: Diagnosis not present

## 2014-11-24 DIAGNOSIS — M25562 Pain in left knee: Secondary | ICD-10-CM | POA: Diagnosis not present

## 2014-11-24 DIAGNOSIS — M545 Low back pain: Secondary | ICD-10-CM | POA: Insufficient documentation

## 2014-11-24 DIAGNOSIS — M47814 Spondylosis without myelopathy or radiculopathy, thoracic region: Secondary | ICD-10-CM

## 2014-11-24 DIAGNOSIS — M546 Pain in thoracic spine: Secondary | ICD-10-CM | POA: Insufficient documentation

## 2014-11-24 DIAGNOSIS — F329 Major depressive disorder, single episode, unspecified: Secondary | ICD-10-CM | POA: Diagnosis not present

## 2014-11-24 DIAGNOSIS — IMO0001 Reserved for inherently not codable concepts without codable children: Secondary | ICD-10-CM

## 2014-11-24 DIAGNOSIS — M609 Myositis, unspecified: Secondary | ICD-10-CM

## 2014-11-24 DIAGNOSIS — M25511 Pain in right shoulder: Secondary | ICD-10-CM | POA: Diagnosis not present

## 2014-11-24 DIAGNOSIS — M1712 Unilateral primary osteoarthritis, left knee: Secondary | ICD-10-CM

## 2014-11-24 DIAGNOSIS — M791 Myalgia: Secondary | ICD-10-CM | POA: Diagnosis not present

## 2014-11-24 MED ORDER — TIZANIDINE HCL 2 MG PO TABS: 2.0000 mg | ORAL_TABLET | Freq: Three times a day (TID) | ORAL | Status: DC | PRN

## 2014-11-24 NOTE — Patient Instructions (Signed)
MASSAGE, POSTURE, HEAT, STRETCHING FOR YOUR LEFT SHOULDER/UPPER BACK

## 2014-11-24 NOTE — Progress Notes (Signed)
Subjective:    Patient ID: [name], male    DOB: 14-Feb-1956, 59 y.o.   MRN: [id number]  HPI   Mr. Bagshaw is back regarding his chronic pain. His pain is most severe in his left knee and his right shoulder. He had good results with the left knee injection in October. He still feels that it's still helping and keeps his knee from "giving out".   He walks almost every day. Typically, he'll walk about 30 minutes per day----often with his dog.   He continues on his   Pain Inventory Average Pain 6 Pain Right Now 6 My pain is aching  In the last 24 hours, has pain interfered with the following? General activity 5 Relation with others 8 Enjoyment of life 5 What TIME of day is your pain at its worst? night Sleep (in general) Poor  Pain is worse with: some activites and stairs Pain improves with: injections Relief from Meds: na  Mobility use a cane how many minutes can you walk? 10 ability to climb steps?  no do you drive?  yes  Function disabled: date disabled 2009 I need assistance with the following:  toileting  Neuro/Psych weakness spasms  Prior Studies Any changes since last visit?  no  Physicians involved in your care Any changes since last visit?  no   Family History  Problem Relation Age of Onset  . COPD Mother   . Asthma Mother   . Colon polyps Mother   . Allergies Mother   . Asthma Maternal Grandmother   . Colon cancer Neg Hx    History   Social History  . Marital Status: Divorced    Spouse Name: N/A  . Number of Children: 2  . Years of Education: N/A   Occupational History  . retired/disabled. prev worked in Therapist, sports.    Social History Main Topics  . Smoking status: Former Smoker -- 2.00 packs/day for 30 years    Types: Cigarettes    Quit date: 10/16/2007  . Smokeless tobacco: Never Used  . Alcohol Use: No  . Drug Use: No  . Sexual Activity: Not on file   Other Topics Concern  . None   Social History Narrative   Lives  alone.   Past Surgical History  Procedure Laterality Date  . Tonsillectomy    . Acne cyst removal      2 on back   . Cardioversion  04/18/2012    Procedure: CARDIOVERSION;  Surgeon: Fay Records, MD;  Location: [place];  Service: Cardiovascular;  Laterality: N/A;  . Tee without cardioversion  04/25/2012    Procedure: TRANSESOPHAGEAL ECHOCARDIOGRAM (TEE);  Surgeon: Thayer Headings, MD;  Location: [place];  Service: Cardiovascular;  Laterality: N/A;  . Cardioversion  04/25/2012    Procedure: CARDIOVERSION;  Surgeon: Thayer Headings, MD;  Location: [facility];  Service: Cardiovascular;  Laterality: N/A;  . Cardioversion  04/25/2012    Procedure: CARDIOVERSION;  Surgeon: Fay Records, MD;  Location: [place];  Service: Cardiovascular;  Laterality: N/A;  . Cardioversion  05/09/2012    Procedure: CARDIOVERSION;  Surgeon: Sherren Mocha, MD;  Location: [place];  Service: Cardiovascular;  Laterality: N/A;  changed from crenshaw to cooper by trish/leone-endo  . Colonoscopy    . Esophagogastroduodenoscopy    . Total knee arthroplasty Right 06/15/2014    Procedure: TOTAL KNEE ARTHROPLASTY;  Surgeon: Renette Butters, MD;  Location: [place];  Service: Orthopedics;  Laterality: Right;  . Colonoscopy with  propofol N/A 10/21/2014    Procedure: COLONOSCOPY WITH PROPOFOL;  Surgeon: Ladene Artist, MD;  Location: [facility];  Service: Endoscopy;  Laterality: N/A;   Past Medical History  Diagnosis Date  . Chronic pain syndrome   . DYSKINESIA, ESOPHAGUS   . FIBROMYALGIA   . HYPERLIPIDEMIA     but unable to take any meds  . INSOMNIA     takes Ambien nightly  . LOW BACK PAIN     DDD and Herniated disc  . ORCHITIS   . Rash and other nonspecific skin eruption 04/12/2009    no cause found saw dermatologists x 2 and allergist  . Atrial fibrillation     takes Coumadin daily along with Pacerone  . Cardiomyopathy     Myoview with prior infarct- 2012; EF 25% TEE July 2013  . H/O hiatal hernia   . Myocardial  infarction     hx of MI  . ANXIETY     takes Xanax nightly  . Muscle spasm     takes Zanaflex daily as needed  . ASTHMA     Albuterol prn and Advair daily;also takes Prednisone daily  . GERD     takes Omeprazole daily  . DIABETES MELLITUS, TYPE II     takes Metformin daily  . HYPERTENSION     takes Lisinopril daily  . Peripheral edema     takes Lasix daily  . CHF (congestive heart failure)     takes Furosemide daily  . COPD (chronic obstructive pulmonary disease)   . O2 dependent     uses oxygen 2 and 1/2 liters per [place] at hs  . Shortness of breath     lying,sitting,exertion  . SLEEP APNEA, OBSTRUCTIVE     doesn't use CPAP  . Peripheral neuropathy   . Fibromyalgia   . Arthritis   . Joint pain   . Joint swelling   . Chronic constipation     takes OTC stool softener  . History of colon polyps   . Diverticulitis   . Urinary urgency   . Nocturia   . History of kidney stones   . Glaucoma   . DEPRESSION     takes Zoloft and Doxepin daily  . Anemia     supposed to be taking Vit B but doesn't   BP 115/70 mmHg  Pulse 135  Resp 16  SpO2 95%  Opioid Risk Score:   Fall Risk Score: Moderate Fall Risk (6-13 points) (previously educated and given handout)  Review of Systems  Respiratory: Positive for shortness of breath.   Cardiovascular: Positive for leg swelling.  Musculoskeletal: Positive for gait problem.       Spasms  Skin: Positive for rash.  Neurological: Positive for weakness.  All other systems reviewed and are negative.      Objective:   Physical Exam  General: Alert and oriented x 3, No apparent distress. Morbidly obese  HEENT: Head is normocephalic, atraumatic, PERRLA, EOMI, sclera anicteric, oral mucosa pink and moist, dentition intact, ext ear canals clear,  Neck: Supple without JVD or lymphadenopathy  Heart: Reg rate and rhythm. No murmurs rubs or gallops  Chest: CTA bilaterally without wheezes, rales, or rhonchi; no distress  Abdomen: Soft,  non-tender, non-distended, bowel sounds positive.  Extremities: No clubbing, cyanosis, or edema. Pulses are 2+  Skin: Clean and intact without signs of breakdown. A few scars noted over mid back. Multiple bruises and ecchymoses over the arms.  Neuro: Pt is cognitively appropriate with normal insight,  memory, and awareness. Cranial nerves 2-12 are intact. Sensory exam is normal. Reflexes are 2+ in all 4's. Fine motor coordination is intact. No tremors. Motor function is grossly 5/5.  Musculoskeletal: He has tightness in his upper right trap and levator scapulae. left knee with crepitus and lateral joint line pain. Minimal effusion today. He's antalgic with gait LLE.  Psych: Pt's affect remains flat. Pt is cooperative and overall very pleasant.    Assessment & Plan:   1. Chronic thoracic and lumbar spine pain. He has DDD of the thoracic spine on plain film which we reviewed today. The most affected levels appear to be T3-6. His MRI shows no acute disease, and the most affected levels remain below the area where is pain is presenting.  2. Myofascial pain in thoracic paraspinals, maladaptive posture syndrome  3. Lumbar-thoracic scoliosis  4. ?RA- dxed by rheumatologist.  5. Obesity  6. OA of the bilateral knees, left more than right.  7. Major depression   Plan:  1. Reviewed massage, stretching, heat for right myofascial pain.  2. tizanidine for muscle spasm at night. refilled 3. Reviewed posture once again as well as health hygiene, weight loss, etc  4.no injection today---consider this spring depending upon course  5. No controlled pain meds being taken currently.  6. Follow up in 3 months.

## 2014-11-26 ENCOUNTER — Telehealth: Payer: Self-pay | Admitting: *Deleted

## 2014-11-26 MED ORDER — DIGOXIN 125 MCG PO TABS: 125.0000 ug | ORAL_TABLET | Freq: Every day | ORAL | Status: DC

## 2014-11-26 NOTE — Telephone Encounter (Signed)
Pt aware, Dr [name] sent the rx in electronically

## 2014-11-26 NOTE — Telephone Encounter (Signed)
Pt called and stated that Dr Jens Som declined his refill on digoxin 125 mcg cause he needed an office visit.  He can not afford to see a specialist right now cause he has 2 specialist appt this month.  He was wondering if Dr [name] would refill this for him.  Please advise

## 2014-11-26 NOTE — Telephone Encounter (Signed)
Ok for one month, but please schedule appt for next mo with Dr [name]

## 2014-12-01 ENCOUNTER — Ambulatory Visit (INDEPENDENT_AMBULATORY_CARE_PROVIDER_SITE_OTHER): Payer: Commercial Managed Care - HMO | Admitting: General Practice

## 2014-12-01 DIAGNOSIS — L308 Other specified dermatitis: Secondary | ICD-10-CM | POA: Diagnosis not present

## 2014-12-01 DIAGNOSIS — Z5181 Encounter for therapeutic drug level monitoring: Secondary | ICD-10-CM | POA: Diagnosis not present

## 2014-12-01 LAB — POCT INR: INR: 1.7

## 2014-12-01 NOTE — Progress Notes (Signed)
Pre visit review using our clinic review tool, if applicable. No additional management support is needed unless otherwise documented below in the visit note. 

## 2014-12-01 NOTE — Progress Notes (Signed)
Agree with plan 

## 2014-12-03 ENCOUNTER — Other Ambulatory Visit: Payer: Self-pay | Admitting: *Deleted

## 2014-12-03 ENCOUNTER — Telehealth: Payer: Self-pay | Admitting: *Deleted

## 2014-12-03 MED ORDER — DIGOXIN 125 MCG PO TABS: 125.0000 ug | ORAL_TABLET | Freq: Every day | ORAL | Status: DC

## 2014-12-03 NOTE — Telephone Encounter (Signed)
Ok to refill until his appt.  Please make patient aware to keep appt.

## 2014-12-03 NOTE — Telephone Encounter (Signed)
Ok to refill digoxin for patient up until his appointment with Dr [name] in May or should he be scheduled with the np/pa? Please advise. Thanks, MI

## 2014-12-05 ENCOUNTER — Other Ambulatory Visit: Payer: Self-pay | Admitting: Internal Medicine

## 2014-12-06 ENCOUNTER — Other Ambulatory Visit: Payer: Self-pay | Admitting: Internal Medicine

## 2014-12-06 ENCOUNTER — Telehealth: Payer: Self-pay | Admitting: Internal Medicine

## 2014-12-06 NOTE — Telephone Encounter (Signed)
New Message  Pt calling to check status of his refill/ Please call back and discuss.

## 2014-12-09 ENCOUNTER — Ambulatory Visit (INDEPENDENT_AMBULATORY_CARE_PROVIDER_SITE_OTHER): Payer: Commercial Managed Care - HMO | Admitting: Internal Medicine

## 2014-12-09 ENCOUNTER — Encounter: Payer: Self-pay | Admitting: Internal Medicine

## 2014-12-09 VITALS — BP 122/70 | HR 95 | Temp 98.1°F | Resp 18 | Ht 69.0 in | Wt 346.0 lb

## 2014-12-09 DIAGNOSIS — R21 Rash and other nonspecific skin eruption: Secondary | ICD-10-CM | POA: Diagnosis not present

## 2014-12-09 DIAGNOSIS — I10 Essential (primary) hypertension: Secondary | ICD-10-CM | POA: Diagnosis not present

## 2014-12-09 DIAGNOSIS — K640 First degree hemorrhoids: Secondary | ICD-10-CM

## 2014-12-09 DIAGNOSIS — I5022 Chronic systolic (congestive) heart failure: Secondary | ICD-10-CM | POA: Diagnosis not present

## 2014-12-09 DIAGNOSIS — E119 Type 2 diabetes mellitus without complications: Secondary | ICD-10-CM

## 2014-12-09 DIAGNOSIS — I4891 Unspecified atrial fibrillation: Secondary | ICD-10-CM

## 2014-12-09 DIAGNOSIS — I452 Bifascicular block: Secondary | ICD-10-CM

## 2014-12-09 DIAGNOSIS — I428 Other cardiomyopathies: Secondary | ICD-10-CM

## 2014-12-09 DIAGNOSIS — I429 Cardiomyopathy, unspecified: Secondary | ICD-10-CM | POA: Diagnosis not present

## 2014-12-09 MED ORDER — METHYLPREDNISOLONE ACETATE 80 MG/ML IJ SUSP
80.0000 mg | Freq: Once | INTRAMUSCULAR | Status: AC
  Administered 2014-12-09: 80 mg via INTRAMUSCULAR

## 2014-12-09 MED ORDER — DIGOXIN 125 MCG PO TABS: 125.0000 ug | ORAL_TABLET | Freq: Every day | ORAL | Status: DC

## 2014-12-09 MED ORDER — PREDNISONE 10 MG PO TABS: ORAL_TABLET | ORAL | Status: DC

## 2014-12-09 MED ORDER — AMIODARONE HCL 100 MG PO TABS: 100.0000 mg | ORAL_TABLET | Freq: Every day | ORAL | Status: DC

## 2014-12-09 NOTE — Assessment & Plan Note (Signed)
Mild to mod, for antibx course,  to f/u any worsening symptoms or concerns, for referral to [facility] per pt request

## 2014-12-09 NOTE — Assessment & Plan Note (Signed)
stable overall by history and exam, recent data reviewed with pt, and pt to continue medical treatment as before,  to f/u any worsening symptoms or concerns Lab Results  Component Value Date   HGBA1C 5.9 06/29/2014   To call or return for cbg > 200 with steroid tx

## 2014-12-09 NOTE — Progress Notes (Signed)
Subjective:    Patient ID: [name], male    DOB: Aug 15, 1956, 59 y.o.   MRN: [id number]  HPI  Here to f/u, unfort with itching starting up again last wk, despite controlled for over 2 wks with last prednisone tx, seen per derm last wk, no other tx given per pt. Asks for referral to derm [facility]. Has tried all oral and topical antihist OTC that do not help well.  Also with recurrent hemorrhoidal discomfort, and asks for referral to GI at [facility].  Unable to get f/u appt with Dr Klein/card until may - needs dig and amio bridge refills until then. Pt denies chest pain, increased sob or doe, wheezing, orthopnea, PND, increased LE swelling, palpitations, dizziness or syncope.  Pt denies new neurological symptoms such as new headache, or facial or extremity weakness or numbness   Pt denies polydipsia, polyuria, Past Medical History  Diagnosis Date  . Chronic pain syndrome   . DYSKINESIA, ESOPHAGUS   . FIBROMYALGIA   . HYPERLIPIDEMIA     but unable to take any meds  . INSOMNIA     takes Ambien nightly  . LOW BACK PAIN     DDD and Herniated disc  . ORCHITIS   . Rash and other nonspecific skin eruption 04/12/2009    no cause found saw dermatologists x 2 and allergist  . Atrial fibrillation     takes Coumadin daily along with Pacerone  . Cardiomyopathy     Myoview with prior infarct- 2012; EF 25% TEE July 2013  . H/O hiatal hernia   . Myocardial infarction     hx of MI  . ANXIETY     takes Xanax nightly  . Muscle spasm     takes Zanaflex daily as needed  . ASTHMA     Albuterol prn and Advair daily;also takes Prednisone daily  . GERD     takes Omeprazole daily  . DIABETES MELLITUS, TYPE II     takes Metformin daily  . HYPERTENSION     takes Lisinopril daily  . Peripheral edema     takes Lasix daily  . CHF (congestive heart failure)     takes Furosemide daily  . COPD (chronic obstructive pulmonary disease)   . O2 dependent     uses oxygen 2 and 1/2 liters per [place] at hs    . Shortness of breath     lying,sitting,exertion  . SLEEP APNEA, OBSTRUCTIVE     doesn't use CPAP  . Peripheral neuropathy   . Fibromyalgia   . Arthritis   . Joint pain   . Joint swelling   . Chronic constipation     takes OTC stool softener  . History of colon polyps   . Diverticulitis   . Urinary urgency   . Nocturia   . History of kidney stones   . Glaucoma   . DEPRESSION     takes Zoloft and Doxepin daily  . Anemia     supposed to be taking Vit B but doesn't   Past Surgical History  Procedure Laterality Date  . Tonsillectomy    . Acne cyst removal      2 on back   . Cardioversion  04/18/2012    Procedure: CARDIOVERSION;  Surgeon: Fay Records, MD;  Location: [place];  Service: Cardiovascular;  Laterality: N/A;  . Tee without cardioversion  04/25/2012    Procedure: TRANSESOPHAGEAL ECHOCARDIOGRAM (TEE);  Surgeon: Thayer Headings, MD;  Location: [place];  Service: Cardiovascular;  Laterality: N/A;  . Cardioversion  04/25/2012    Procedure: CARDIOVERSION;  Surgeon: Thayer Headings, MD;  Location: [place];  Service: Cardiovascular;  Laterality: N/A;  . Cardioversion  04/25/2012    Procedure: CARDIOVERSION;  Surgeon: Fay Records, MD;  Location: [place];  Service: Cardiovascular;  Laterality: N/A;  . Cardioversion  05/09/2012    Procedure: CARDIOVERSION;  Surgeon: Sherren Mocha, MD;  Location: [place];  Service: Cardiovascular;  Laterality: N/A;  changed from crenshaw to cooper by trish/leone-endo  . Colonoscopy    . Esophagogastroduodenoscopy    . Total knee arthroplasty Right 06/15/2014    Procedure: TOTAL KNEE ARTHROPLASTY;  Surgeon: Renette Butters, MD;  Location: [place];  Service: Orthopedics;  Laterality: Right;  . Colonoscopy with propofol N/A 10/21/2014    Procedure: COLONOSCOPY WITH PROPOFOL;  Surgeon: Ladene Artist, MD;  Location: [facility];  Service: Endoscopy;  Laterality: N/A;    reports that he quit smoking about 7 years ago. His smoking use included  Cigarettes. He has a 60 pack-year smoking history. He has never used smokeless tobacco. He reports that he does not drink alcohol or use illicit drugs. family history includes Allergies in his mother; Asthma in his maternal grandmother and mother; COPD in his mother; Colon polyps in his mother. There is no history of Colon cancer. Allergies  Allergen Reactions  . Simvastatin     REACTION: severe leg cramps  . Statins Other (See Comments)    myalgia   Current Outpatient Prescriptions on File Prior to Visit  Medication Sig Dispense Refill  . albuterol (PROVENTIL HFA;VENTOLIN HFA) 108 (90 BASE) MCG/ACT inhaler Inhale 2 puffs into the lungs 4 (four) times daily.    [name] ALPRAZolam (XANAX) 0.5 MG tablet TAKE 1 TABLET BY MOUTH TWICE A DAY AS NEEDED FOR ANXIETY 60 tablet 2  . amiodarone (PACERONE) 100 MG tablet Take 1 tablet (100 mg total) by mouth daily. 90 tablet 0  . carisoprodol (SOMA) 350 MG tablet TAKE 1 TABLET BY MOUTH 3 TIMES A DAY AS NEEDED 90 tablet 2  . clobetasol cream (TEMOVATE) 4.17 % Apply 1 application topically 2 (two) times daily.    . digoxin (LANOXIN) 0.125 MG tablet Take 1 tablet (125 mcg total) by mouth daily. 30 tablet 2  . diltiazem 2 % GEL Apply 1 application topically 3 (three) times daily. 30 g 1  . doxepin (SINEQUAN) 25 MG capsule Take 25 mg by mouth 2 (two) times daily.    . Fluticasone-Salmeterol (ADVAIR) 250-50 MCG/DOSE AEPB Inhale 1-2 puffs into the lungs daily.    . furosemide (LASIX) 40 MG tablet TAKE 1 TABLET BY MOUTH TWICE A DAY 180 tablet 0  . hydrocortisone (ANUSOL-HC) 2.5 % rectal cream Place 1 application rectally 3 (three) times daily. 30 g 1  . hydrocortisone (ANUSOL-HC) 25 MG suppository Place 1 suppository (25 mg total) rectally every 12 (twelve) hours. 12 suppository 1  . hydrOXYzine (ATARAX/VISTARIL) 25 MG tablet Take 1 tablet (25 mg total) by mouth 3 (three) times daily as needed. For itching 30 tablet 1  . ibuprofen (ADVIL,MOTRIN) 200 MG tablet Take 200  mg by mouth every 6 (six) hours as needed for mild pain.     [name] lisinopril (PRINIVIL,ZESTRIL) 5 MG tablet TAKE 1 TABLET (5 MG TOTAL) BY MOUTH DAILY. 30 tablet 3  . Loratadine 10 MG CAPS Take 1 capsule by mouth daily.     . metFORMIN (GLUCOPHAGE) 500 MG tablet Take 500 mg by mouth  2 (two) times daily with a meal.    . omeprazole (PRILOSEC) 20 MG capsule Take 20 mg by mouth 2 (two) times daily before a meal.    . polyethylene glycol powder (GLYCOLAX/MIRALAX) powder Take 255 g by mouth daily. 255 g 3  . sertraline (ZOLOFT) 100 MG tablet Take 2 tablets (200 mg total) by mouth daily. (Patient taking differently: Take 200 mg by mouth at bedtime. ) 180 tablet 3  . tiZANidine (ZANAFLEX) 2 MG tablet Take 1 tablet (2 mg total) by mouth 3 (three) times daily as needed for muscle spasms. 30 tablet 4  . Tolnaftate POWD Apply 1 application topically daily.     . traZODone (DESYREL) 100 MG tablet TAKE 1/2 TABLET BY MOUTH AT BEDTIME AS NEEDED 45 tablet 1  . warfarin (COUMADIN) 5 MG tablet Take as directed by anticoagulation clinic 30 tablet 3  . witch hazel-glycerin (TUCKS) pad Apply 1 application topically as needed for itching or irritation.     [name] zolpidem (AMBIEN) 10 MG tablet TAKE 1 TABLET BY MOUTH AT BEDTIME AS NEEDED FOR SLEEP 30 tablet 5  . [DISCONTINUED] atorvastatin (LIPITOR) 20 MG tablet Take 1 tablet (20 mg total) by mouth daily. 30 tablet 11  . [DISCONTINUED] gabapentin (NEURONTIN) 300 MG capsule Take 1 capsule (300 mg total) by mouth 3 (three) times daily. 90 capsule 5   No current facility-administered medications on file prior to visit.   Review of Systems  Constitutional: Negative for unusual diaphoresis or other sweats  HENT: Negative for ringing in ear Eyes: Negative for double vision or worsening visual disturbance.  Respiratory: Negative for choking and stridor.   Gastrointestinal: Negative for vomiting or other signifcant bowel change Genitourinary: Negative for hematuria or decreased  urine volume.  Musculoskeletal: Negative for other MSK pain or swelling Skin: Negative for color change and worsening wound.  Neurological: Negative for tremors and numbness other than noted  Psychiatric/Behavioral: Negative for decreased concentration or agitation other than above       Objective:   Physical Exam BP 122/70 mmHg  Pulse 95  Temp(Src) 98.1 F (36.7 C) (Oral)  Resp 18  Ht 5\' 9"  (1.753 m)  Wt 346 lb 0.6 oz (156.963 kg)  BMI 51.08 kg/m2  SpO2 97% VS noted,  Constitutional: Pt appears well-developed, well-nourished.  HENT: Head: NCAT.  Right Ear: External ear normal.  Left Ear: External ear normal.  Eyes: . Pupils are equal, round, and reactive to light. Conjunctivae and EOM are normal Neck: Normal range of motion. Neck supple.  Cardiovascular: Normal rate and regular rhythm.   Pulmonary/Chest: Effort normal and breath sounds without rales or wheezing.  Abd:  Soft, NT, ND, + BS Neurological: Pt is alert. Not confused , motor grossly intact Skin: Skin is warm. No rash but diffuse excoriations to back Psychiatric: Pt behavior is normal. No agitation.      Assessment & Plan:

## 2014-12-09 NOTE — Progress Notes (Signed)
Pre visit review using our clinic review tool, if applicable. No additional management support is needed unless otherwise documented below in the visit note. 

## 2014-12-09 NOTE — Assessment & Plan Note (Addendum)
Mild to mod, for depomedrol IM, predpac asd,  to f/u any worsening symptoms or concerns, for referral [facility] derm per pt request

## 2014-12-09 NOTE — Assessment & Plan Note (Signed)
stable overall by history and exam, recent data reviewed with pt, and pt to continue medical treatment as before,  to f/u any worsening symptoms or concerns BP Readings from Last 3 Encounters:  12/09/14 122/70  11/24/14 115/70  10/27/14 124/60

## 2014-12-09 NOTE — Patient Instructions (Signed)
You had the steroid shot today  Please take all new medication as prescribed - the prednisone  Please continue all other medications as before, and refills have been done if requested.  Please have the pharmacy call with any other refills you may need.  Please continue your efforts at being more active, low cholesterol Diabetic diet, and weight control.  Please keep your appointments with your specialists as you may have planned  You will be contacted regarding the referral for: Dermatology and GI at [facility]

## 2014-12-10 ENCOUNTER — Telehealth: Payer: Self-pay | Admitting: Internal Medicine

## 2014-12-10 NOTE — Telephone Encounter (Signed)
emmi mailed  °

## 2014-12-13 ENCOUNTER — Other Ambulatory Visit: Payer: Self-pay | Admitting: Internal Medicine

## 2014-12-13 DIAGNOSIS — G4733 Obstructive sleep apnea (adult) (pediatric): Secondary | ICD-10-CM | POA: Diagnosis not present

## 2014-12-14 DIAGNOSIS — H40023 Open angle with borderline findings, high risk, bilateral: Secondary | ICD-10-CM | POA: Diagnosis not present

## 2014-12-14 DIAGNOSIS — E119 Type 2 diabetes mellitus without complications: Secondary | ICD-10-CM | POA: Diagnosis not present

## 2014-12-27 ENCOUNTER — Other Ambulatory Visit: Payer: Self-pay | Admitting: Internal Medicine

## 2014-12-29 ENCOUNTER — Ambulatory Visit: Payer: Commercial Managed Care - HMO

## 2014-12-29 MED ORDER — DIPHENHYDRAMINE HCL 50 MG PO TABS: 50.0000 mg | ORAL_TABLET | Freq: Four times a day (QID) | ORAL | Status: DC | PRN

## 2014-12-29 NOTE — Telephone Encounter (Signed)
Called pt no answer LMOM with md response. Sent rx for benadryl...Johny Chess

## 2014-12-29 NOTE — Telephone Encounter (Signed)
Very sorry, but we cannot continue daily prednisone for the itching as this can be harmful with ongoing treatment  OK to try benadryl 50 mg q 6 hrs prn for now

## 2014-12-31 ENCOUNTER — Other Ambulatory Visit: Payer: Self-pay | Admitting: Internal Medicine

## 2015-01-04 ENCOUNTER — Encounter: Payer: Self-pay | Admitting: Internal Medicine

## 2015-01-04 ENCOUNTER — Ambulatory Visit (INDEPENDENT_AMBULATORY_CARE_PROVIDER_SITE_OTHER): Payer: Commercial Managed Care - HMO | Admitting: Internal Medicine

## 2015-01-04 VITALS — BP 122/80 | HR 62 | Temp 98.3°F | Resp 20 | Ht 69.0 in | Wt 343.0 lb

## 2015-01-04 DIAGNOSIS — L309 Dermatitis, unspecified: Secondary | ICD-10-CM | POA: Insufficient documentation

## 2015-01-04 DIAGNOSIS — E119 Type 2 diabetes mellitus without complications: Secondary | ICD-10-CM

## 2015-01-04 DIAGNOSIS — I10 Essential (primary) hypertension: Secondary | ICD-10-CM | POA: Diagnosis not present

## 2015-01-04 MED ORDER — PREDNISONE 10 MG PO TABS: ORAL_TABLET | ORAL | Status: DC

## 2015-01-04 MED ORDER — METHYLPREDNISOLONE ACETATE 80 MG/ML IJ SUSP
80.0000 mg | Freq: Once | INTRAMUSCULAR | Status: AC
  Administered 2015-01-04: 80 mg via INTRAMUSCULAR

## 2015-01-04 MED ORDER — SERTRALINE HCL 100 MG PO TABS: 200.0000 mg | ORAL_TABLET | Freq: Every day | ORAL | Status: DC

## 2015-01-04 MED ORDER — POLYETHYLENE GLYCOL 3350 17 GM/SCOOP PO POWD: 1.0000 | Freq: Every day | ORAL | Status: DC

## 2015-01-04 NOTE — Assessment & Plan Note (Signed)
stable overall by history and exam, recent data reviewed with pt, and pt to continue medical treatment as before,  to f/u any worsening symptoms or concerns Lab Results  Component Value Date   HGBA1C 5.9 06/29/2014   To monitor cbg's closely on steroid tx

## 2015-01-04 NOTE — Patient Instructions (Signed)
You had the steroid shot today  Please take all new medication as prescribed  - the prednisone  Please continue all other medications as before, and refills have been done if requested.  Please have the pharmacy call with any other refills you may need.  Please keep your appointments with your specialists as you may have planned  You should hear soon from the [facility] in the office regarding the referrals

## 2015-01-04 NOTE — Assessment & Plan Note (Signed)
stable overall by history and exam, recent data reviewed with pt, and pt to continue medical treatment as before,  to f/u any worsening symptoms or concerns BP Readings from Last 3 Encounters:  01/04/15 122/80  12/09/14 122/70  11/24/14 115/70

## 2015-01-04 NOTE — Progress Notes (Signed)
Subjective:    Patient ID: [name], male    DOB: 06/07/1956, 59 y.o.   MRN: [id number]  HPI  Here unfortunately with persistent itching and mild rash, seen per dermatolgoy recently but steroid cream helps only somewhat, asks for repeat depomedrol and predpac.   Has not yet heard about referrals for derm to [facility] and GI dated feb 25.  Pt denies chest pain, increased sob or doe, wheezing, orthopnea, PND, increased LE swelling, palpitations, dizziness or syncope.  Pt denies new neurological symptoms such as new headache, or facial or extremity weakness or numbness   Pt denies polydipsia, polyuria,  Past Medical History  Diagnosis Date  . Chronic pain syndrome   . DYSKINESIA, ESOPHAGUS   . FIBROMYALGIA   . HYPERLIPIDEMIA     but unable to take any meds  . INSOMNIA     takes Ambien nightly  . LOW BACK PAIN     DDD and Herniated disc  . ORCHITIS   . Rash and other nonspecific skin eruption 04/12/2009    no cause found saw dermatologists x 2 and allergist  . Atrial fibrillation     takes Coumadin daily along with Pacerone  . Cardiomyopathy     Myoview with prior infarct- 2012; EF 25% TEE July 2013  . H/O hiatal hernia   . Myocardial infarction     hx of MI  . ANXIETY     takes Xanax nightly  . Muscle spasm     takes Zanaflex daily as needed  . ASTHMA     Albuterol prn and Advair daily;also takes Prednisone daily  . GERD     takes Omeprazole daily  . DIABETES MELLITUS, TYPE II     takes Metformin daily  . HYPERTENSION     takes Lisinopril daily  . Peripheral edema     takes Lasix daily  . CHF (congestive heart failure)     takes Furosemide daily  . COPD (chronic obstructive pulmonary disease)   . O2 dependent     uses oxygen 2 and 1/2 liters per [place] at hs  . Shortness of breath     lying,sitting,exertion  . SLEEP APNEA, OBSTRUCTIVE     doesn't use CPAP  . Peripheral neuropathy   . Fibromyalgia   . Arthritis   . Joint pain   . Joint swelling   . Chronic  constipation     takes OTC stool softener  . History of colon polyps   . Diverticulitis   . Urinary urgency   . Nocturia   . History of kidney stones   . Glaucoma   . DEPRESSION     takes Zoloft and Doxepin daily  . Anemia     supposed to be taking Vit B but doesn't   Past Surgical History  Procedure Laterality Date  . Tonsillectomy    . Acne cyst removal      2 on back   . Cardioversion  04/18/2012    Procedure: CARDIOVERSION;  Surgeon: Fay Records, MD;  Location: [place];  Service: Cardiovascular;  Laterality: N/A;  . Tee without cardioversion  04/25/2012    Procedure: TRANSESOPHAGEAL ECHOCARDIOGRAM (TEE);  Surgeon: Thayer Headings, MD;  Location: [place];  Service: Cardiovascular;  Laterality: N/A;  . Cardioversion  04/25/2012    Procedure: CARDIOVERSION;  Surgeon: Thayer Headings, MD;  Location: [place];  Service: Cardiovascular;  Laterality: N/A;  . Cardioversion  04/25/2012    Procedure: CARDIOVERSION;  Surgeon: Carmin Muskrat  Harrington Challenger, MD;  Location: [place];  Service: Cardiovascular;  Laterality: N/A;  . Cardioversion  05/09/2012    Procedure: CARDIOVERSION;  Surgeon: Sherren Mocha, MD;  Location: [place];  Service: Cardiovascular;  Laterality: N/A;  changed from [place]renshaw to cooper by trish/leone-endo  . Colonoscopy    . Esophagogastroduodenoscopy    . Total knee arthroplasty Right 06/15/2014    Procedure: TOTAL KNEE ARTHROPLASTY;  Surgeon: Renette Butters, MD;  Location: [place];  Service: Orthopedics;  Laterality: Right;  . Colonoscopy with propofol N/A 10/21/2014    Procedure: COLONOSCOPY WITH PROPOFOL;  Surgeon: Ladene Artist, MD;  Location: [facility];  Service: Endoscopy;  Laterality: N/A;    reports that he quit smoking about 7 years ago. His smoking use included Cigarettes. He has a 60 pack-year smoking history. He has never used smokeless tobacco. He reports that he does not drink alcohol or use illicit drugs. family history includes Allergies in his mother; Asthma in his  maternal grandmother and mother; COPD in his mother; Colon polyps in his mother. There is no history of Colon cancer. Allergies  Allergen Reactions  . Simvastatin     REACTION: severe leg cramps  . Statins Other (See Comments)    myalgia   Current Outpatient Prescriptions on File Prior to Visit  Medication Sig Dispense Refill  . ADVAIR DISKUS 250-50 MCG/DOSE AEPB TAKE 1 PUFF BY MOUTH DAILY AS DIRECTED 60 each 3  . albuterol (PROVENTIL HFA;VENTOLIN HFA) 108 (90 BASE) MCG/ACT inhaler Inhale 2 puffs into the lungs 4 (four) times daily.    [name] ALPRAZolam (XANAX) 0.5 MG tablet TAKE 1 TABLET BY MOUTH TWICE A DAY AS NEEDED FOR ANXIETY 60 tablet 2  . amiodarone (PACERONE) 100 MG tablet Take 1 tablet (100 mg total) by mouth daily. 90 tablet 0  . carisoprodol (SOMA) 350 MG tablet TAKE 1 TABLET BY MOUTH 3 TIMES A DAY AS NEEDED 90 tablet 2  . clobetasol cream (TEMOVATE) 7.78 % Apply 1 application topically 2 (two) times daily.    . digoxin (LANOXIN) 0.125 MG tablet Take 1 tablet (125 mcg total) by mouth daily. 30 tablet 3  . diltiazem 2 % GEL Apply 1 application topically 3 (three) times daily. 30 g 1  . diphenhydrAMINE (BENADRYL) 50 MG tablet Take 1 tablet (50 mg total) by mouth every 6 (six) hours as needed for itching. 30 tablet 2  . doxepin (SINEQUAN) 25 MG capsule Take 25 mg by mouth 2 (two) times daily.    . Fluticasone-Salmeterol (ADVAIR) 250-50 MCG/DOSE AEPB Inhale 1-2 puffs into the lungs daily.    . furosemide (LASIX) 40 MG tablet TAKE 1 TABLET BY MOUTH TWICE A DAY 180 tablet 0  . hydrocortisone (ANUSOL-HC) 2.5 % rectal cream Place 1 application rectally 3 (three) times daily. 30 g 1  . hydrocortisone (ANUSOL-HC) 25 MG suppository Place 1 suppository (25 mg total) rectally every 12 (twelve) hours. 12 suppository 1  . hydrOXYzine (ATARAX/VISTARIL) 25 MG tablet TAKE 1 TABLET BY MOUTH 3 TIMES A DAY FOR ITCHING 30 tablet 1  . ibuprofen (ADVIL,MOTRIN) 200 MG tablet Take 200 mg by mouth every 6 (six)  hours as needed for mild pain.     [name] lisinopril (PRINIVIL,ZESTRIL) 5 MG tablet TAKE 1 TABLET (5 MG TOTAL) BY MOUTH DAILY. 30 tablet 3  . Loratadine 10 MG CAPS Take 1 capsule by mouth daily.     . metFORMIN (GLUCOPHAGE) 500 MG tablet Take 500 mg by mouth 2 (two) times daily with a  meal.    . omeprazole (PRILOSEC) 20 MG capsule Take 20 mg by mouth 2 (two) times daily before a meal.    . predniSONE (DELTASONE) 10 MG tablet 4tab bu mouth x 3day,3tab x 3day,2tab x3day,1tab x 3day 30 tablet 0  . tiZANidine (ZANAFLEX) 2 MG tablet Take 1 tablet (2 mg total) by mouth 3 (three) times daily as needed for muscle spasms. 30 tablet 4  . Tolnaftate POWD Apply 1 application topically daily.     . traZODone (DESYREL) 100 MG tablet TAKE 1/2 TABLET BY MOUTH AT BEDTIME AS NEEDED 45 tablet 1  . warfarin (COUMADIN) 5 MG tablet Take as directed by anticoagulation clinic 30 tablet 3  . witch hazel-glycerin (TUCKS) pad Apply 1 application topically as needed for itching or irritation.     [name] zolpidem (AMBIEN) 10 MG tablet TAKE 1 TABLET BY MOUTH AT BEDTIME AS NEEDED FOR SLEEP 30 tablet 5  . [DISCONTINUED] atorvastatin (LIPITOR) 20 MG tablet Take 1 tablet (20 mg total) by mouth daily. 30 tablet 11  . [DISCONTINUED] gabapentin (NEURONTIN) 300 MG capsule Take 1 capsule (300 mg total) by mouth 3 (three) times daily. 90 capsule 5   No current facility-administered medications on file prior to visit.    Review of Systems  Constitutional: Negative for unusual diaphoresis or night sweats HENT: Negative for ringing in ear or discharge Eyes: Negative for double vision or worsening visual disturbance.  Respiratory: Negative for choking and stridor.   Gastrointestinal: Negative for vomiting or other signifcant bowel change Genitourinary: Negative for hematuria or change in urine volume.  Musculoskeletal: Negative for other MSK pain or swelling Skin: Negative for color change and worsening wound.  Neurological: Negative for  tremors and numbness other than noted  Psychiatric/Behavioral: Negative for decreased concentration or agitation other than above       Objective:   Physical Exam BP 122/80 mmHg  Pulse 62  Temp(Src) 98.3 F (36.8 C) (Oral)  Resp 20  Ht 5\' 9"  (1.753 m)  Wt 343 lb (155.584 kg)  BMI 50.63 kg/m2  SpO2 96% N/A  VS noted,  Constitutional: Pt appears in no significant distress HENT: Head: NCAT.  Right Ear: External ear normal.  Left Ear: External ear normal.  Eyes: . Pupils are equal, round, and reactive to light. Conjunctivae and EOM are normal Neck: Normal range of motion. Neck supple.  Cardiovascular: Normal rate and regular rhythm.   Pulmonary/Chest: Effort normal and breath sounds without rales or wheezing.  Abd:  Soft, NT, ND, + BS Neurological: Pt is alert. Not confused , motor grossly intact Skin: Skin is warm. + mild rash to left abd area, but itching all over, no LE edema Psychiatric: Pt behavior is normal. No agitation.      Assessment & Plan:

## 2015-01-04 NOTE — Assessment & Plan Note (Signed)
Mild overall but itching severe subjectively, for depomedrol, predpac asd,  to f/u any worsening symptoms or concerns

## 2015-01-04 NOTE — Progress Notes (Signed)
Pre visit review using our clinic review tool, if applicable. No additional management support is needed unless otherwise documented below in the visit note. 

## 2015-01-11 ENCOUNTER — Ambulatory Visit (INDEPENDENT_AMBULATORY_CARE_PROVIDER_SITE_OTHER): Payer: Commercial Managed Care - HMO | Admitting: General Practice

## 2015-01-11 DIAGNOSIS — I4891 Unspecified atrial fibrillation: Secondary | ICD-10-CM

## 2015-01-11 DIAGNOSIS — Z5181 Encounter for therapeutic drug level monitoring: Secondary | ICD-10-CM | POA: Diagnosis not present

## 2015-01-11 DIAGNOSIS — G4733 Obstructive sleep apnea (adult) (pediatric): Secondary | ICD-10-CM | POA: Diagnosis not present

## 2015-01-11 LAB — POCT INR: INR: 2.4

## 2015-01-11 NOTE — Progress Notes (Signed)
Agree with plan 

## 2015-01-11 NOTE — Progress Notes (Signed)
Pre visit review using our clinic review tool, if applicable. No additional management support is needed unless otherwise documented below in the visit note. 

## 2015-01-16 ENCOUNTER — Other Ambulatory Visit: Payer: Self-pay | Admitting: Physician Assistant

## 2015-01-21 DIAGNOSIS — Z86010 Personal history of colonic polyps: Secondary | ICD-10-CM | POA: Diagnosis not present

## 2015-01-21 DIAGNOSIS — K5900 Constipation, unspecified: Secondary | ICD-10-CM | POA: Diagnosis not present

## 2015-01-21 DIAGNOSIS — K648 Other hemorrhoids: Secondary | ICD-10-CM | POA: Diagnosis not present

## 2015-02-03 ENCOUNTER — Telehealth: Payer: Self-pay | Admitting: *Deleted

## 2015-02-03 ENCOUNTER — Other Ambulatory Visit: Payer: Self-pay | Admitting: Internal Medicine

## 2015-02-03 MED ORDER — ALPRAZOLAM 0.5 MG PO TABS: ORAL_TABLET | ORAL | Status: DC

## 2015-02-03 NOTE — Telephone Encounter (Signed)
Received fax pt requesting refill on alprazolam 0.5 mg take 1 twice a day as needed for anxiety. Last filled 01/03/15...[name]

## 2015-02-03 NOTE — Telephone Encounter (Signed)
Done hardcopy to [name]  

## 2015-02-04 NOTE — Telephone Encounter (Signed)
rx fax to pharmacy by Wallene Dales

## 2015-02-04 NOTE — Telephone Encounter (Signed)
Pharmacy didn't receive fax. Please refax

## 2015-02-05 NOTE — Telephone Encounter (Signed)
Called in rx for to pharmacy.  Pt informed.

## 2015-02-08 ENCOUNTER — Ambulatory Visit (INDEPENDENT_AMBULATORY_CARE_PROVIDER_SITE_OTHER): Payer: Commercial Managed Care - HMO | Admitting: General Practice

## 2015-02-08 ENCOUNTER — Ambulatory Visit (INDEPENDENT_AMBULATORY_CARE_PROVIDER_SITE_OTHER): Payer: Commercial Managed Care - HMO | Admitting: Internal Medicine

## 2015-02-08 ENCOUNTER — Ambulatory Visit: Payer: Commercial Managed Care - HMO

## 2015-02-08 ENCOUNTER — Encounter: Payer: Self-pay | Admitting: Internal Medicine

## 2015-02-08 VITALS — BP 118/78 | HR 98 | Temp 98.1°F | Resp 20 | Ht 69.0 in | Wt 327.0 lb

## 2015-02-08 DIAGNOSIS — Z5181 Encounter for therapeutic drug level monitoring: Secondary | ICD-10-CM | POA: Diagnosis not present

## 2015-02-08 DIAGNOSIS — L209 Atopic dermatitis, unspecified: Secondary | ICD-10-CM

## 2015-02-08 DIAGNOSIS — L309 Dermatitis, unspecified: Secondary | ICD-10-CM

## 2015-02-08 DIAGNOSIS — I10 Essential (primary) hypertension: Secondary | ICD-10-CM

## 2015-02-08 DIAGNOSIS — E119 Type 2 diabetes mellitus without complications: Secondary | ICD-10-CM

## 2015-02-08 DIAGNOSIS — I4891 Unspecified atrial fibrillation: Secondary | ICD-10-CM | POA: Diagnosis not present

## 2015-02-08 LAB — POCT INR: INR: 2.1

## 2015-02-08 MED ORDER — METHYLPREDNISOLONE ACETATE 80 MG/ML IJ SUSP
80.0000 mg | Freq: Once | INTRAMUSCULAR | Status: AC
  Administered 2015-02-08: 80 mg via INTRAMUSCULAR

## 2015-02-08 MED ORDER — PREDNISONE 10 MG PO TABS: ORAL_TABLET | ORAL | Status: DC

## 2015-02-08 MED ORDER — CARISOPRODOL 350 MG PO TABS: 350.0000 mg | ORAL_TABLET | Freq: Three times a day (TID) | ORAL | Status: DC | PRN

## 2015-02-08 NOTE — Assessment & Plan Note (Signed)
stable overall by history and exam, recent data reviewed with pt, and pt to continue medical treatment as before,  to f/u any worsening symptoms or concerns BP Readings from Last 3 Encounters:  02/08/15 118/78  01/04/15 122/80  12/09/14 122/70

## 2015-02-08 NOTE — Patient Instructions (Signed)
You had the steroid shot today  Please take all new medication as prescribed - the prednisone  Please continue all other medications as before, and refills have been done if requested - the soma  Please have the pharmacy call with any other refills you may need.  Please keep your appointments with your specialists as you may have planned - dermatology in June 2016

## 2015-02-08 NOTE — Progress Notes (Signed)
Agree with plan 

## 2015-02-08 NOTE — Assessment & Plan Note (Signed)
stable overall by history and exam, recent data reviewed with pt, and pt to continue medical treatment as before,  to f/u any worsening symptoms or concerns Lab Results  Component Value Date   HGBA1C 5.9 06/29/2014   Pt to call for onset polys or cbg > 200

## 2015-02-08 NOTE — Progress Notes (Signed)
Subjective:    Patient ID: [name], male    DOB: [birth year], 59 y.o.   MRN: [id number]  HPI  Here with repeat flare of known atopic dermatitis, has been seen per derm in past but topical tx not helping enough, has only seemed to improve with prednisone, requesting repeat tx with mod to severe itch and faint rash.  Has appt with new dermatologist in June  Pt denies chest pain, increased sob or doe, wheezing, orthopnea, PND, increased LE swelling, palpitations, dizziness or syncope.  Pt denies new neurological symptoms such as new headache, or facial or extremity weakness or numbness   Pt denies polydipsia, polyuria,  Antihist not helping Past Medical History  Diagnosis Date  . Chronic pain syndrome   . DYSKINESIA, ESOPHAGUS   . FIBROMYALGIA   . HYPERLIPIDEMIA     but unable to take any meds  . INSOMNIA     takes Ambien nightly  . LOW BACK PAIN     DDD and Herniated disc  . ORCHITIS   . Rash and other nonspecific skin eruption 04/12/2009    no cause found saw dermatologists x 2 and allergist  . Atrial fibrillation     takes Coumadin daily along with Pacerone  . Cardiomyopathy     Myoview with prior infarct- 2012; EF 25% TEE July 2013  . H/O hiatal hernia   . Myocardial infarction     hx of MI  . ANXIETY     takes Xanax nightly  . Muscle spasm     takes Zanaflex daily as needed  . ASTHMA     Albuterol prn and Advair daily;also takes Prednisone daily  . GERD     takes Omeprazole daily  . DIABETES MELLITUS, TYPE II     takes Metformin daily  . HYPERTENSION     takes Lisinopril daily  . Peripheral edema     takes Lasix daily  . CHF (congestive heart failure)     takes Furosemide daily  . COPD (chronic obstructive pulmonary disease)   . O2 dependent     uses oxygen 2 and 1/2 liters per [place] at hs  . Shortness of breath     lying,sitting,exertion  . SLEEP APNEA, OBSTRUCTIVE     doesn't use CPAP  . Peripheral neuropathy   . Fibromyalgia   . Arthritis   . Joint pain     . Joint swelling   . Chronic constipation     takes OTC stool softener  . History of colon polyps   . Diverticulitis   . Urinary urgency   . Nocturia   . History of kidney stones   . Glaucoma   . DEPRESSION     takes Zoloft and Doxepin daily  . Anemia     supposed to be taking Vit B but doesn't   Past Surgical History  Procedure Laterality Date  . Tonsillectomy    . Acne cyst removal      2 on back   . Cardioversion  04/18/2012    Procedure: CARDIOVERSION;  Surgeon: Fay Records, MD;  Location: [place];  Service: Cardiovascular;  Laterality: N/A;  . Tee without cardioversion  04/25/2012    Procedure: TRANSESOPHAGEAL ECHOCARDIOGRAM (TEE);  Surgeon: Thayer Headings, MD;  Location: [place];  Service: Cardiovascular;  Laterality: N/A;  . Cardioversion  04/25/2012    Procedure: CARDIOVERSION;  Surgeon: Thayer Headings, MD;  Location: [facility] ENDOSCOPY;  Service: Cardiovascular;  Laterality: N/A;  . Cardioversion  04/25/2012    Procedure: CARDIOVERSION;  Surgeon: Fay Records, MD;  Location: [place];  Service: Cardiovascular;  Laterality: N/A;  . Cardioversion  05/09/2012    Procedure: CARDIOVERSION;  Surgeon: Sherren Mocha, MD;  Location: [facility] OR;  Service: Cardiovascular;  Laterality: N/A;  changed from crenshaw to cooper by trish/leone-endo  . Colonoscopy    . Esophagogastroduodenoscopy    . Total knee arthroplasty Right 06/15/2014    Procedure: TOTAL KNEE ARTHROPLASTY;  Surgeon: Renette Butters, MD;  Location: [place];  Service: Orthopedics;  Laterality: Right;  . Colonoscopy with propofol N/A 10/21/2014    Procedure: COLONOSCOPY WITH PROPOFOL;  Surgeon: Ladene Artist, MD;  Location: [facility] ENDOSCOPY;  Service: Endoscopy;  Laterality: N/A;    reports that he quit smoking about 7 years ago. His smoking use included Cigarettes. He has a 60 pack-year smoking history. He has never used smokeless tobacco. He reports that he does not drink alcohol or use illicit drugs. family history includes Allergies  in his mother; Asthma in his maternal grandmother and mother; COPD in his mother; Colon polyps in his mother. There is no history of Colon cancer. Allergies  Allergen Reactions  . Simvastatin     REACTION: severe leg cramps  . Statins Other (See Comments)    myalgia   Current Outpatient Prescriptions on File Prior to Visit  Medication Sig Dispense Refill  . ADVAIR DISKUS 250-50 MCG/DOSE AEPB TAKE 1 PUFF BY MOUTH DAILY AS DIRECTED 60 each 3  . albuterol (PROVENTIL HFA;VENTOLIN HFA) 108 (90 BASE) MCG/ACT inhaler Inhale 2 puffs into the lungs 4 (four) times daily.    [name] ALPRAZolam (XANAX) 0.5 MG tablet TAKE 1 TABLET BY MOUTH TWICE A DAY AS NEEDED FOR ANXIETY 60 tablet 2  . amiodarone (PACERONE) 100 MG tablet Take 1 tablet (100 mg total) by mouth daily. 90 tablet 0  . clobetasol cream (TEMOVATE) 7.25 % Apply 1 application topically 2 (two) times daily.    . digoxin (LANOXIN) 0.125 MG tablet Take 1 tablet (125 mcg total) by mouth daily. 30 tablet 3  . diphenhydrAMINE (BENADRYL) 50 MG tablet Take 1 tablet (50 mg total) by mouth every 6 (six) hours as needed for itching. 30 tablet 2  . doxepin (SINEQUAN) 25 MG capsule Take 25 mg by mouth 2 (two) times daily.    . Fluticasone-Salmeterol (ADVAIR) 250-50 MCG/DOSE AEPB Inhale 1-2 puffs into the lungs daily.    . furosemide (LASIX) 40 MG tablet TAKE 1 TABLET BY MOUTH TWICE A DAY 180 tablet 0  . hydrocortisone (ANUSOL-HC) 2.5 % rectal cream Place 1 application rectally 3 (three) times daily. 30 g 1  . hydrocortisone (ANUSOL-HC) 25 MG suppository Place 1 suppository (25 mg total) rectally every 12 (twelve) hours. 12 suppository 1  . hydrOXYzine (ATARAX/VISTARIL) 25 MG tablet TAKE 1 TABLET BY MOUTH 3 TIMES A DAY FOR ITCHING 30 tablet 1  . ibuprofen (ADVIL,MOTRIN) 200 MG tablet Take 200 mg by mouth every 6 (six) hours as needed for mild pain.     [name] lisinopril (PRINIVIL,ZESTRIL) 5 MG tablet TAKE 1 TABLET (5 MG TOTAL) BY MOUTH DAILY. 30 tablet 3  . Loratadine  10 MG CAPS Take 1 capsule by mouth daily.     . metFORMIN (GLUCOPHAGE) 500 MG tablet Take 500 mg by mouth 2 (two) times daily with a meal.    . omeprazole (PRILOSEC) 20 MG capsule Take 20 mg by mouth 2 (two) times daily before a meal.    . polyethylene glycol  powder (GLYCOLAX/MIRALAX) powder Take 255 g by mouth daily. 255 g 3  . sertraline (ZOLOFT) 100 MG tablet Take 2 tablets (200 mg total) by mouth at bedtime. 180 tablet 3  . tiZANidine (ZANAFLEX) 2 MG tablet Take 1 tablet (2 mg total) by mouth 3 (three) times daily as needed for muscle spasms. 30 tablet 4  . Tolnaftate POWD Apply 1 application topically daily.     . traZODone (DESYREL) 100 MG tablet TAKE 1/2 TABLET BY MOUTH AT BEDTIME AS NEEDED 45 tablet 1  . warfarin (COUMADIN) 5 MG tablet Take as directed by anticoagulation clinic 30 tablet 3  . witch hazel-glycerin (TUCKS) pad Apply 1 application topically as needed for itching or irritation.     [name] zolpidem (AMBIEN) 10 MG tablet TAKE 1 TABLET BY MOUTH AT BEDTIME AS NEEDED FOR SLEEP 30 tablet 5  . [DISCONTINUED] atorvastatin (LIPITOR) 20 MG tablet Take 1 tablet (20 mg total) by mouth daily. 30 tablet 11  . [DISCONTINUED] gabapentin (NEURONTIN) 300 MG capsule Take 1 capsule (300 mg total) by mouth 3 (three) times daily. 90 capsule 5   No current facility-administered medications on file prior to visit.   Review of Systems  Constitutional: Negative for unusual diaphoresis or night sweats HENT: Negative for ringing in ear or discharge Eyes: Negative for double vision or worsening visual disturbance.  Respiratory: Negative for choking and stridor.   Gastrointestinal: Negative for vomiting or other signifcant bowel change Genitourinary: Negative for hematuria or change in urine volume.  Musculoskeletal: Negative for other MSK pain or swelling Skin: Negative for color change and worsening wound.  Neurological: Negative for tremors and numbness other than noted  Psychiatric/Behavioral:  Negative for decreased concentration or agitation other than above       Objective:   Physical Exam BP 118/78 mmHg  Pulse 98  Temp(Src) 98.1 F (36.7 C) (Oral)  Resp 20  Ht 5\' 9"  (1.753 m)  Wt 327 lb (148.326 kg)  BMI 48.27 kg/m2  SpO2 94% VS noted,  Constitutional: Pt appears in no significant distress HENT: Head: NCAT.  Right Ear: External ear normal.  Left Ear: External ear normal.  Eyes: . Pupils are equal, round, and reactive to light. Conjunctivae and EOM are normal Neck: Normal range of motion. Neck supple.  Cardiovascular: Normal rate and regular rhythm.   Pulmonary/Chest: Effort normal and breath sounds without rales or wheezing.  Neurological: Pt is alert. Not confused , motor grossly intact Skin: Skin is warm. + diffuse very faint pink rash, dry skin, no LE edema Psychiatric: Pt behavior is normal. No agitation.     Assessment & Plan:

## 2015-02-08 NOTE — Assessment & Plan Note (Signed)
C/w atopic dermatitis, for predpac asd,  to f/u any worsening symptoms or concerns, fu derm as planned

## 2015-02-08 NOTE — Progress Notes (Signed)
Pre visit review using our clinic review tool, if applicable. No additional management support is needed unless otherwise documented below in the visit note. 

## 2015-02-10 ENCOUNTER — Telehealth: Payer: Self-pay

## 2015-02-10 NOTE — Telephone Encounter (Signed)
error 

## 2015-02-10 NOTE — Telephone Encounter (Signed)
Told patient that his carisoprodol rx has been approved through [facility] until 10/15/2015 and that he could drop his prescription off at his pharmacy.

## 2015-02-10 NOTE — Telephone Encounter (Signed)
Left message for patient to call back regarding his PA for the carisoprodol prescription.

## 2015-02-11 DIAGNOSIS — G4733 Obstructive sleep apnea (adult) (pediatric): Secondary | ICD-10-CM | POA: Diagnosis not present

## 2015-02-21 ENCOUNTER — Encounter: Payer: Self-pay | Admitting: Gastroenterology

## 2015-02-21 ENCOUNTER — Other Ambulatory Visit (INDEPENDENT_AMBULATORY_CARE_PROVIDER_SITE_OTHER): Payer: Commercial Managed Care - HMO

## 2015-02-21 DIAGNOSIS — Z0189 Encounter for other specified special examinations: Secondary | ICD-10-CM

## 2015-02-21 DIAGNOSIS — Z0000 Encounter for general adult medical examination without abnormal findings: Secondary | ICD-10-CM

## 2015-02-21 LAB — CBC WITH DIFFERENTIAL/PLATELET
BASOS PCT: 0.7 % (ref 0.0–3.0)
Basophils Absolute: 0.1 10*3/uL (ref 0.0–0.1)
EOS PCT: 1.6 % (ref 0.0–5.0)
Eosinophils Absolute: 0.1 10*3/uL (ref 0.0–0.7)
HEMATOCRIT: 27.1 % — AB (ref 39.0–52.0)
LYMPHS ABS: 2.1 10*3/uL (ref 0.7–4.0)
Lymphocytes Relative: 27.2 % (ref 12.0–46.0)
MCHC: 30 g/dL (ref 30.0–36.0)
MONO ABS: 1 10*3/uL (ref 0.1–1.0)
MONOS PCT: 13.2 % — AB (ref 3.0–12.0)
Neutro Abs: 4.5 10*3/uL (ref 1.4–7.7)
Neutrophils Relative %: 57.3 % (ref 43.0–77.0)
Platelets: 252 10*3/uL (ref 150.0–400.0)
RBC: 4.16 Mil/uL — AB (ref 4.22–5.81)
RDW: 21.2 % — ABNORMAL HIGH (ref 11.5–15.5)
WBC: 7.8 10*3/uL (ref 4.0–10.5)

## 2015-02-21 LAB — BASIC METABOLIC PANEL
BUN: 17 mg/dL (ref 6–23)
CHLORIDE: 107 meq/L (ref 96–112)
CO2: 27 mEq/L (ref 19–32)
CREATININE: 0.76 mg/dL (ref 0.40–1.50)
Calcium: 8.7 mg/dL (ref 8.4–10.5)
GFR: 111.13 mL/min (ref >60.00)
Glucose, Bld: 108 mg/dL — ABNORMAL HIGH (ref 70–99)
POTASSIUM: 3.8 meq/L (ref 3.5–5.1)
SODIUM: 138 meq/L (ref 135–145)

## 2015-02-21 LAB — URINALYSIS, ROUTINE W REFLEX MICROSCOPIC
Bilirubin Urine: NEGATIVE
Hgb urine dipstick: NEGATIVE
Ketones, ur: NEGATIVE
LEUKOCYTES UA: NEGATIVE
Nitrite: NEGATIVE
SPECIFIC GRAVITY, URINE: 1.025 (ref 1.000–1.030)
Total Protein, Urine: NEGATIVE
Urine Glucose: NEGATIVE
Urobilinogen, UA: 0.2 (ref 0.0–1.0)
pH: 6 (ref 5.0–8.0)

## 2015-02-21 LAB — HEPATIC FUNCTION PANEL
ALBUMIN: 3.6 g/dL (ref 3.5–5.2)
ALK PHOS: 62 U/L (ref 39–117)
ALT: 21 U/L (ref 0–53)
AST: 18 U/L (ref 0–37)
BILIRUBIN TOTAL: 0.4 mg/dL (ref 0.2–1.2)
Bilirubin, Direct: 0.1 mg/dL (ref 0.0–0.3)
Total Protein: 5.8 g/dL — ABNORMAL LOW (ref 6.0–8.3)

## 2015-02-21 LAB — MICROALBUMIN / CREATININE URINE RATIO
Creatinine,U: 167.3 mg/dL
Microalb Creat Ratio: 0.8 mg/g (ref 0.0–30.0)
Microalb, Ur: 1.4 mg/dL (ref 0.0–1.9)

## 2015-02-21 LAB — TSH: TSH: 0.01 u[IU]/mL — AB (ref 0.35–4.50)

## 2015-02-21 LAB — LIPID PANEL
Cholesterol: 119 mg/dL (ref 0–200)
HDL: 36.4 mg/dL — AB (ref >39.00)
LDL Cholesterol: 69 mg/dL (ref 0–99)
NONHDL: 82.6
TRIGLYCERIDES: 67 mg/dL (ref 0.0–149.0)
Total CHOL/HDL Ratio: 3
VLDL: 13.4 mg/dL (ref 0.0–40.0)

## 2015-02-21 LAB — PSA: PSA: 2.12 ng/mL (ref 0.10–4.00)

## 2015-02-21 LAB — HEMOGLOBIN A1C: HEMOGLOBIN A1C: 6 % (ref 4.6–6.5)

## 2015-02-22 ENCOUNTER — Other Ambulatory Visit: Payer: Self-pay | Admitting: Internal Medicine

## 2015-02-22 ENCOUNTER — Encounter: Payer: Self-pay | Admitting: Internal Medicine

## 2015-02-22 ENCOUNTER — Ambulatory Visit: Payer: Commercial Managed Care - HMO | Admitting: Physical Medicine & Rehabilitation

## 2015-02-22 DIAGNOSIS — R7989 Other specified abnormal findings of blood chemistry: Secondary | ICD-10-CM

## 2015-02-23 ENCOUNTER — Ambulatory Visit (INDEPENDENT_AMBULATORY_CARE_PROVIDER_SITE_OTHER): Payer: Commercial Managed Care - HMO | Admitting: Internal Medicine

## 2015-02-23 ENCOUNTER — Encounter: Payer: Self-pay | Admitting: Internal Medicine

## 2015-02-23 ENCOUNTER — Other Ambulatory Visit: Payer: Self-pay | Admitting: Internal Medicine

## 2015-02-23 ENCOUNTER — Ambulatory Visit: Payer: Commercial Managed Care - HMO | Admitting: Physical Medicine & Rehabilitation

## 2015-02-23 ENCOUNTER — Other Ambulatory Visit (INDEPENDENT_AMBULATORY_CARE_PROVIDER_SITE_OTHER): Payer: Commercial Managed Care - HMO

## 2015-02-23 VITALS — BP 122/78 | HR 87 | Temp 98.3°F | Resp 18 | Ht 69.0 in | Wt 330.0 lb

## 2015-02-23 DIAGNOSIS — Z23 Encounter for immunization: Secondary | ICD-10-CM

## 2015-02-23 DIAGNOSIS — D649 Anemia, unspecified: Secondary | ICD-10-CM

## 2015-02-23 DIAGNOSIS — Z0000 Encounter for general adult medical examination without abnormal findings: Secondary | ICD-10-CM

## 2015-02-23 DIAGNOSIS — L209 Atopic dermatitis, unspecified: Secondary | ICD-10-CM

## 2015-02-23 DIAGNOSIS — R7989 Other specified abnormal findings of blood chemistry: Secondary | ICD-10-CM

## 2015-02-23 DIAGNOSIS — E0590 Thyrotoxicosis, unspecified without thyrotoxic crisis or storm: Secondary | ICD-10-CM | POA: Insufficient documentation

## 2015-02-23 DIAGNOSIS — E119 Type 2 diabetes mellitus without complications: Secondary | ICD-10-CM

## 2015-02-23 DIAGNOSIS — D509 Iron deficiency anemia, unspecified: Secondary | ICD-10-CM

## 2015-02-23 LAB — TSH: TSH: 0.04 u[IU]/mL — ABNORMAL LOW (ref 0.35–4.50)

## 2015-02-23 LAB — VITAMIN B12: Vitamin B-12: 288 pg/mL (ref 211–911)

## 2015-02-23 LAB — T4, FREE: FREE T4: 1.44 ng/dL (ref 0.60–1.60)

## 2015-02-23 LAB — IBC PANEL
IRON: 15 ug/dL — AB (ref 42–165)
Saturation Ratios: 3 % — ABNORMAL LOW (ref 20.0–50.0)
Transferrin: 356 mg/dL (ref 212.0–360.0)

## 2015-02-23 MED ORDER — HYDROCORTISONE 2.5 % EX CREA
TOPICAL_CREAM | Freq: Two times a day (BID) | CUTANEOUS | Status: DC
Start: 2015-02-23 — End: 2015-08-11

## 2015-02-23 NOTE — Assessment & Plan Note (Signed)
Etiology unclear, is on coumadin, no overt blood loss, for colonoscpy later this wk, gave pt copy of currents labs  Lab Results  Component Value Date   WBC 7.8 02/21/2015   HGB 8.1 Repeated and verified X2.* 02/21/2015   HCT 27.1* 02/21/2015   MCV 65.1 Repeated and verified X2.* 02/21/2015   PLT 252.0 02/21/2015  For iron/b12 today, should be ok to review by GI at [facility] by May 13 through [facility] computer functionality

## 2015-02-23 NOTE — Addendum Note (Signed)
Addended by: Valerie Salts on: 02/23/2015 02:36 PM   Modules accepted: Orders

## 2015-02-23 NOTE — Progress Notes (Signed)
Subjective:    Patient ID: [name], male    DOB: Oct 16, 1955, 59 y.o.   MRN: [id number]  HPI  Here for wellness and f/u;  Overall doing ok;  Pt denies Chest pain, worsening SOB, DOE, wheezing, orthopnea, PND, worsening LE edema, palpitations, dizziness or syncope.  Pt denies neurological change such as new headache, facial or extremity weakness.  Pt denies polydipsia, polyuria, or low sugar symptoms. Pt states overall good compliance with treatment and medications, good tolerability, and has been trying to follow appropriate diet.  Pt denies worsening depressive symptoms, suicidal ideation or panic. No fever, night sweats, wt loss, loss of appetite, or other constitutional symptoms.  Pt states good ability with ADL's, has low fall risk, home safety reviewed and adequate, no other significant changes in hearing or vision, and only occasionally active with exercise.  Still ithcing somewhat.  Due for repeat colonoscopy Friday may 19 with GI at [facility]. No overt bleeding, remaiins on coumadin.  Hgb noted lower with last labs, no recent iron testing.  Remains on PPI daily. No recent egd. Atopic dermatitis still itching, aks for hydrocort 2.5 cr refill. Past Medical History  Diagnosis Date  . Chronic pain syndrome   . DYSKINESIA, ESOPHAGUS   . FIBROMYALGIA   . HYPERLIPIDEMIA     but unable to take any meds  . INSOMNIA     takes Ambien nightly  . LOW BACK PAIN     DDD and Herniated disc  . ORCHITIS   . Rash and other nonspecific skin eruption 04/12/2009    no cause found saw dermatologists x 2 and allergist  . Atrial fibrillation     takes Coumadin daily along with Pacerone  . Cardiomyopathy     Myoview with prior infarct- 2012; EF 25% TEE July 2013  . H/O hiatal hernia   . Myocardial infarction     hx of MI  . ANXIETY     takes Xanax nightly  . Muscle spasm     takes Zanaflex daily as needed  . ASTHMA     Albuterol prn and Advair daily;also takes Prednisone daily  . GERD     takes  Omeprazole daily  . DIABETES MELLITUS, TYPE II     takes Metformin daily  . HYPERTENSION     takes Lisinopril daily  . Peripheral edema     takes Lasix daily  . CHF (congestive heart failure)     takes Furosemide daily  . COPD (chronic obstructive pulmonary disease)   . O2 dependent     uses oxygen 2 and 1/2 liters per [place] at hs  . Shortness of breath     lying,sitting,exertion  . SLEEP APNEA, OBSTRUCTIVE     doesn't use CPAP  . Peripheral neuropathy   . Fibromyalgia   . Arthritis   . Joint pain   . Joint swelling   . Chronic constipation     takes OTC stool softener  . History of colon polyps   . Diverticulitis   . Urinary urgency   . Nocturia   . History of kidney stones   . Glaucoma   . DEPRESSION     takes Zoloft and Doxepin daily  . Anemia     supposed to be taking Vit B but doesn't   Past Surgical History  Procedure Laterality Date  . Tonsillectomy    . Acne cyst removal      2 on back   . Cardioversion  04/18/2012  Procedure: CARDIOVERSION;  Surgeon: Fay Records, MD;  Location: [place];  Service: Cardiovascular;  Laterality: N/A;  . Tee without cardioversion  04/25/2012    Procedure: TRANSESOPHAGEAL ECHOCARDIOGRAM (TEE);  Surgeon: Thayer Headings, MD;  Location: [place];  Service: Cardiovascular;  Laterality: N/A;  . Cardioversion  04/25/2012    Procedure: CARDIOVERSION;  Surgeon: Thayer Headings, MD;  Location: [facility] ENDOSCOPY;  Service: Cardiovascular;  Laterality: N/A;  . Cardioversion  04/25/2012    Procedure: CARDIOVERSION;  Surgeon: Fay Records, MD;  Location: [place];  Service: Cardiovascular;  Laterality: N/A;  . Cardioversion  05/09/2012    Procedure: CARDIOVERSION;  Surgeon: Sherren Mocha, MD;  Location: [place];  Service: Cardiovascular;  Laterality: N/A;  changed from crenshaw to cooper by trish/leone-endo  . Colonoscopy    . Esophagogastroduodenoscopy    . Total knee arthroplasty Right 06/15/2014    Procedure: TOTAL KNEE ARTHROPLASTY;  Surgeon: Renette Butters, MD;  Location: [place];  Service: Orthopedics;  Laterality: Right;  . Colonoscopy with propofol N/A 10/21/2014    Procedure: COLONOSCOPY WITH PROPOFOL;  Surgeon: Ladene Artist, MD;  Location: [facility] ENDOSCOPY;  Service: Endoscopy;  Laterality: N/A;    reports that he quit smoking about 7 years ago. His smoking use included Cigarettes. He has a 60 pack-year smoking history. He has never used smokeless tobacco. He reports that he does not drink alcohol or use illicit drugs. family history includes Allergies in his mother; Asthma in his maternal grandmother and mother; COPD in his mother; Colon polyps in his mother. There is no history of Colon cancer. Allergies  Allergen Reactions  . Simvastatin     REACTION: severe leg cramps  . Statins Other (See Comments)    myalgia   Current Outpatient Prescriptions on File Prior to Visit  Medication Sig Dispense Refill  . ADVAIR DISKUS 250-50 MCG/DOSE AEPB TAKE 1 PUFF BY MOUTH DAILY AS DIRECTED 60 each 3  . albuterol (PROVENTIL HFA;VENTOLIN HFA) 108 (90 BASE) MCG/ACT inhaler Inhale 2 puffs into the lungs 4 (four) times daily.    [name] ALPRAZolam (XANAX) 0.5 MG tablet TAKE 1 TABLET BY MOUTH TWICE A DAY AS NEEDED FOR ANXIETY 60 tablet 2  . amiodarone (PACERONE) 100 MG tablet Take 1 tablet (100 mg total) by mouth daily. 90 tablet 0  . carisoprodol (SOMA) 350 MG tablet Take 1 tablet (350 mg total) by mouth 3 (three) times daily as needed. 90 tablet 2  . clobetasol cream (TEMOVATE) 3.53 % Apply 1 application topically 2 (two) times daily.    . digoxin (LANOXIN) 0.125 MG tablet Take 1 tablet (125 mcg total) by mouth daily. 30 tablet 3  . diphenhydrAMINE (BENADRYL) 50 MG tablet Take 1 tablet (50 mg total) by mouth every 6 (six) hours as needed for itching. 30 tablet 2  . doxepin (SINEQUAN) 25 MG capsule Take 25 mg by mouth 2 (two) times daily.    . Fluticasone-Salmeterol (ADVAIR) 250-50 MCG/DOSE AEPB Inhale 1-2 puffs into the lungs daily.    . furosemide (LASIX)  40 MG tablet TAKE 1 TABLET BY MOUTH TWICE A DAY 180 tablet 0  . hydrocortisone (ANUSOL-HC) 2.5 % rectal cream Place 1 application rectally 3 (three) times daily. 30 g 1  . hydrocortisone (ANUSOL-HC) 25 MG suppository Place 1 suppository (25 mg total) rectally every 12 (twelve) hours. 12 suppository 1  . hydrOXYzine (ATARAX/VISTARIL) 25 MG tablet TAKE 1 TABLET BY MOUTH 3 TIMES A DAY FOR ITCHING 30 tablet 1  .  ibuprofen (ADVIL,MOTRIN) 200 MG tablet Take 200 mg by mouth every 6 (six) hours as needed for mild pain.     . Linaclotide (LINZESS) 145 MCG CAPS capsule Take 145 mcg by mouth daily.    [name] lisinopril (PRINIVIL,ZESTRIL) 5 MG tablet TAKE 1 TABLET (5 MG TOTAL) BY MOUTH DAILY. 30 tablet 3  . Loratadine 10 MG CAPS Take 1 capsule by mouth daily.     . metFORMIN (GLUCOPHAGE) 500 MG tablet Take 500 mg by mouth 2 (two) times daily with a meal.    . omeprazole (PRILOSEC) 20 MG capsule Take 20 mg by mouth 2 (two) times daily before a meal.    . polyethylene glycol powder (GLYCOLAX/MIRALAX) powder Take 255 g by mouth daily. 255 g 3  . predniSONE (DELTASONE) 10 MG tablet 4tab bu mouth x 3day,3tab x 3day,2tab x3day,1tab x 3day 30 tablet 0  . sertraline (ZOLOFT) 100 MG tablet Take 2 tablets (200 mg total) by mouth at bedtime. 180 tablet 3  . tiZANidine (ZANAFLEX) 2 MG tablet Take 1 tablet (2 mg total) by mouth 3 (three) times daily as needed for muscle spasms. 30 tablet 4  . Tolnaftate POWD Apply 1 application topically daily.     . traZODone (DESYREL) 100 MG tablet TAKE 1/2 TABLET BY MOUTH AT BEDTIME AS NEEDED 45 tablet 1  . warfarin (COUMADIN) 5 MG tablet Take as directed by anticoagulation clinic 30 tablet 3  . witch hazel-glycerin (TUCKS) pad Apply 1 application topically as needed for itching or irritation.     [name] zolpidem (AMBIEN) 10 MG tablet TAKE 1 TABLET BY MOUTH AT BEDTIME AS NEEDED FOR SLEEP 30 tablet 5  . [DISCONTINUED] atorvastatin (LIPITOR) 20 MG tablet Take 1 tablet (20 mg total) by mouth daily.  30 tablet 11  . [DISCONTINUED] gabapentin (NEURONTIN) 300 MG capsule Take 1 capsule (300 mg total) by mouth 3 (three) times daily. 90 capsule 5   No current facility-administered medications on file prior to visit.    Review of Systems Constitutional: Negative for increased diaphoresis, other activity, appetite or siginficant weight change other than noted HENT: Negative for worsening hearing loss, ear pain, facial swelling, mouth sores and neck stiffness.   Eyes: Negative for other worsening pain, redness or visual disturbance.  Respiratory: Negative for shortness of breath and wheezing  Cardiovascular: Negative for chest pain and palpitations.  Gastrointestinal: Negative for diarrhea, blood in stool, abdominal distention or other pain Genitourinary: Negative for hematuria, flank pain or change in urine volume.  Musculoskeletal: Negative for myalgias or other joint complaints.  Skin: Negative for color change and wound or drainage.  Neurological: Negative for syncope and numbness. other than noted Hematological: Negative for adenopathy. or other swelling Psychiatric/Behavioral: Negative for hallucinations, SI, self-injury, decreased concentration or other worsening agitation.      Objective:   Physical Exam BP 122/78 mmHg  Pulse 87  Temp(Src) 98.3 F (36.8 C) (Oral)  Resp 18  Ht 5\' 9"  (1.753 m)  Wt 330 lb (149.687 kg)  BMI 48.71 kg/m2  SpO2 96% VS noted,  Constitutional: Pt is oriented to person, place, and time. Appears well-developed and well-nourished, in no significant distress Head: Normocephalic and atraumatic.  Right Ear: External ear normal.  Left Ear: External ear normal.  Nose: Nose normal.  Mouth/Throat: Oropharynx is clear and moist.  Eyes: Conjunctivae and EOM are normal. Pupils are equal, round, and reactive to light.  Neck: Normal range of motion. Neck supple. No JVD present. No tracheal deviation present or significant  neck LA or mass Cardiovascular: Normal  rate, regular rhythm, normal heart sounds and intact distal pulses.   Pulmonary/Chest: Effort normal and breath sounds without rales or wheezing  Abdominal: Soft. Bowel sounds are normal. NT. No HSM  Musculoskeletal: Normal range of motion. Exhibits no edema.  Lymphadenopathy:  Has no cervical adenopathy.  Neurological: Pt is alert and oriented to person, place, and time. Pt has normal reflexes. No cranial nerve deficit. Motor grossly intact Skin: Skin is warm and dry. No rash noted.  Psychiatric:  Has mild nervousl mood and affect. Behavior is normal.     Assessment & Plan:

## 2015-02-23 NOTE — Assessment & Plan Note (Signed)
Overall doing well, age appropriate education and counseling updated, referrals for preventative services and immunizations addressed, dietary and smoking counseling addressed, most recent labs reviewed.  I have personally reviewed and have noted:  1) the patient's medical and social history 2) The pt's use of alcohol, tobacco, and illicit drugs 3) The patient's current medications and supplements 4) Functional ability including ADL's, fall risk, home safety risk, hearing and visual impairment 5) Diet and physical activities 6) Evidence for depression or mood disorder 7) The patient's height, weight, and BMI have been recorded in the chart  I have made referrals, and provided counseling and education based on review of the above  

## 2015-02-23 NOTE — Patient Instructions (Signed)
You had the Tdap tetanus shot, and the Pneumovax pneumonia shot today  Please continue all other medications as before, and refills have been done if requested.  Please have the pharmacy call with any other refills you may need.  Please continue your efforts at being more active, low cholesterol diet, and weight control.  You are otherwise up to date with prevention measures today.  Please keep your appointments with your specialists as you may have planned - [facility] on Friday May 13  Please go to the LAB in the Basement (turn left off the elevator) for the tests to be done today  You will be contacted by phone if any changes need to be made immediately.  Otherwise, you will receive a letter about your results with an explanation, but please check with MyChart first.  Please remember to sign up for MyChart if you have not done so, as this will be important to you in the future with finding out test results, communicating by private email, and scheduling acute appointments online when needed.  Please return in 6 months, or sooner if needed, with Lab testing done 3-5 days before

## 2015-02-23 NOTE — Assessment & Plan Note (Signed)
Ok for [employer] cr 2.5 prn

## 2015-02-23 NOTE — Assessment & Plan Note (Signed)
?   Hyperthyroid, for f/u labs, consider endo referral

## 2015-02-23 NOTE — Progress Notes (Signed)
Pre visit review using our clinic review tool, if applicable. No additional management support is needed unless otherwise documented below in the visit note. 

## 2015-02-25 ENCOUNTER — Telehealth: Payer: Self-pay | Admitting: Internal Medicine

## 2015-02-25 DIAGNOSIS — I4891 Unspecified atrial fibrillation: Secondary | ICD-10-CM | POA: Insufficient documentation

## 2015-02-25 DIAGNOSIS — E119 Type 2 diabetes mellitus without complications: Secondary | ICD-10-CM | POA: Insufficient documentation

## 2015-02-25 DIAGNOSIS — Z87891 Personal history of nicotine dependence: Secondary | ICD-10-CM | POA: Insufficient documentation

## 2015-02-25 DIAGNOSIS — I4821 Permanent atrial fibrillation: Secondary | ICD-10-CM | POA: Insufficient documentation

## 2015-02-25 NOTE — Telephone Encounter (Signed)
Patient returned your call.

## 2015-02-25 NOTE — Telephone Encounter (Signed)
Called pt back concerning labs see results note...[name]

## 2015-03-03 ENCOUNTER — Telehealth: Payer: Self-pay

## 2015-03-03 NOTE — Telephone Encounter (Signed)
Pt called back stating that he is scheduled to have a procedure at [facility] 03/04/2015 and therefore will not be able to have labs done until 03/13/2014. Is this okay?

## 2015-03-03 NOTE — Telephone Encounter (Signed)
Ok , but should come sooner if has any obvious bleeding, worsening dizziness, sob, CP or falls.

## 2015-03-03 NOTE — Telephone Encounter (Signed)
Pt informed and will come in for labs 05.23.15

## 2015-03-04 DIAGNOSIS — K648 Other hemorrhoids: Secondary | ICD-10-CM | POA: Diagnosis not present

## 2015-03-04 DIAGNOSIS — E119 Type 2 diabetes mellitus without complications: Secondary | ICD-10-CM | POA: Diagnosis not present

## 2015-03-04 DIAGNOSIS — K5730 Diverticulosis of large intestine without perforation or abscess without bleeding: Secondary | ICD-10-CM | POA: Diagnosis not present

## 2015-03-04 DIAGNOSIS — Z1211 Encounter for screening for malignant neoplasm of colon: Secondary | ICD-10-CM | POA: Diagnosis not present

## 2015-03-04 DIAGNOSIS — Z86010 Personal history of colonic polyps: Secondary | ICD-10-CM | POA: Diagnosis not present

## 2015-03-04 DIAGNOSIS — K625 Hemorrhage of anus and rectum: Secondary | ICD-10-CM | POA: Diagnosis not present

## 2015-03-07 ENCOUNTER — Other Ambulatory Visit (INDEPENDENT_AMBULATORY_CARE_PROVIDER_SITE_OTHER): Payer: Commercial Managed Care - HMO

## 2015-03-07 DIAGNOSIS — D509 Iron deficiency anemia, unspecified: Secondary | ICD-10-CM

## 2015-03-07 DIAGNOSIS — E119 Type 2 diabetes mellitus without complications: Secondary | ICD-10-CM | POA: Diagnosis not present

## 2015-03-07 LAB — CBC WITH DIFFERENTIAL/PLATELET
BASOS PCT: 0.1 % (ref 0.0–3.0)
Basophils Absolute: 0 10*3/uL (ref 0.0–0.1)
Eosinophils Absolute: 0.1 10*3/uL (ref 0.0–0.7)
Eosinophils Relative: 0.8 % (ref 0.0–5.0)
HEMATOCRIT: 27.7 % — AB (ref 39.0–52.0)
Hemoglobin: 8.3 g/dL — ABNORMAL LOW (ref 13.0–17.0)
LYMPHS PCT: 20.7 % (ref 12.0–46.0)
Lymphs Abs: 1.8 10*3/uL (ref 0.7–4.0)
MCHC: 29.9 g/dL — AB (ref 30.0–36.0)
MCV: 67 fl — AB (ref 78.0–100.0)
Monocytes Absolute: 1.2 10*3/uL — ABNORMAL HIGH (ref 0.1–1.0)
Monocytes Relative: 13.6 % — ABNORMAL HIGH (ref 3.0–12.0)
Neutro Abs: 5.5 10*3/uL (ref 1.4–7.7)
Neutrophils Relative %: 64.8 % (ref 43.0–77.0)
Platelets: 306 10*3/uL (ref 150.0–400.0)
RBC: 4.16 Mil/uL — ABNORMAL LOW (ref 4.22–5.81)
RDW: 22.9 % — ABNORMAL HIGH (ref 11.5–15.5)
WBC: 8.5 10*3/uL (ref 4.0–10.5)

## 2015-03-07 LAB — BASIC METABOLIC PANEL
BUN: 18 mg/dL (ref 6–23)
CHLORIDE: 109 meq/L (ref 96–112)
CO2: 28 meq/L (ref 19–32)
CREATININE: 0.77 mg/dL (ref 0.40–1.50)
Calcium: 8.8 mg/dL (ref 8.4–10.5)
GFR: 109.45 mL/min (ref >60.00)
GLUCOSE: 87 mg/dL (ref 70–99)
Potassium: 3.9 mEq/L (ref 3.5–5.1)
Sodium: 141 mEq/L (ref 135–145)

## 2015-03-07 LAB — LIPID PANEL
CHOLESTEROL: 99 mg/dL (ref 0–200)
HDL: 30.1 mg/dL — ABNORMAL LOW (ref >39.00)
LDL Cholesterol: 54 mg/dL (ref 0–99)
NONHDL: 68.9
TRIGLYCERIDES: 73 mg/dL (ref 0.0–149.0)
Total CHOL/HDL Ratio: 3
VLDL: 14.6 mg/dL (ref 0.0–40.0)

## 2015-03-07 LAB — HEPATIC FUNCTION PANEL
ALBUMIN: 3.9 g/dL (ref 3.5–5.2)
ALT: 21 U/L (ref 0–53)
AST: 16 U/L (ref 0–37)
Alkaline Phosphatase: 63 U/L (ref 39–117)
BILIRUBIN TOTAL: 0.3 mg/dL (ref 0.2–1.2)
Bilirubin, Direct: 0.1 mg/dL (ref 0.0–0.3)
Total Protein: 6.3 g/dL (ref 6.0–8.3)

## 2015-03-07 LAB — HEMOGLOBIN A1C: HEMOGLOBIN A1C: 5.8 % (ref 4.6–6.5)

## 2015-03-08 ENCOUNTER — Encounter: Payer: Self-pay | Admitting: Internal Medicine

## 2015-03-08 ENCOUNTER — Ambulatory Visit (INDEPENDENT_AMBULATORY_CARE_PROVIDER_SITE_OTHER): Payer: Commercial Managed Care - HMO | Admitting: General Practice

## 2015-03-08 DIAGNOSIS — Z5181 Encounter for therapeutic drug level monitoring: Secondary | ICD-10-CM | POA: Diagnosis not present

## 2015-03-08 DIAGNOSIS — I4891 Unspecified atrial fibrillation: Secondary | ICD-10-CM | POA: Diagnosis not present

## 2015-03-08 LAB — POCT INR: INR: 1.7

## 2015-03-08 NOTE — Progress Notes (Signed)
I have reviewed and agree with the plan. 

## 2015-03-08 NOTE — Progress Notes (Signed)
Pre visit review using our clinic review tool, if applicable. No additional management support is needed unless otherwise documented below in the visit note. 

## 2015-03-09 ENCOUNTER — Ambulatory Visit (INDEPENDENT_AMBULATORY_CARE_PROVIDER_SITE_OTHER): Payer: Commercial Managed Care - HMO | Admitting: Internal Medicine

## 2015-03-09 ENCOUNTER — Encounter: Payer: Self-pay | Admitting: Internal Medicine

## 2015-03-09 VITALS — BP 110/70 | HR 109 | Ht 70.0 in | Wt 334.0 lb

## 2015-03-09 DIAGNOSIS — I4891 Unspecified atrial fibrillation: Secondary | ICD-10-CM | POA: Diagnosis not present

## 2015-03-09 DIAGNOSIS — I480 Paroxysmal atrial fibrillation: Secondary | ICD-10-CM | POA: Diagnosis not present

## 2015-03-09 DIAGNOSIS — I10 Essential (primary) hypertension: Secondary | ICD-10-CM | POA: Diagnosis not present

## 2015-03-09 DIAGNOSIS — I5022 Chronic systolic (congestive) heart failure: Secondary | ICD-10-CM | POA: Diagnosis not present

## 2015-03-09 DIAGNOSIS — I429 Cardiomyopathy, unspecified: Secondary | ICD-10-CM

## 2015-03-09 DIAGNOSIS — I428 Other cardiomyopathies: Secondary | ICD-10-CM

## 2015-03-09 DIAGNOSIS — I452 Bifascicular block: Secondary | ICD-10-CM

## 2015-03-09 MED ORDER — AMIODARONE HCL 200 MG PO TABS: 200.0000 mg | ORAL_TABLET | Freq: Every day | ORAL | Status: DC

## 2015-03-09 MED ORDER — METOPROLOL SUCCINATE ER 25 MG PO TB24: 25.0000 mg | ORAL_TABLET | Freq: Every day | ORAL | Status: DC

## 2015-03-09 MED ORDER — FUROSEMIDE 80 MG PO TABS: 80.0000 mg | ORAL_TABLET | Freq: Two times a day (BID) | ORAL | Status: DC

## 2015-03-09 NOTE — Patient Instructions (Addendum)
Medication Instructions:  Your physician has recommended you make the following change in your medication:  1) INCREASE Amiodarone to 200 mg twice daily for 7 days --- then take 200 mg daily 2) INCREASE Furosemide to 80 mg twice daily 3) START Metoprolol succinate 25 mg twice daily  Labwork: Please obtain INR next week at [facility]  Testing/Procedures: Your physician has requested that you have a TEE/Cardioversion. During a TEE, sound waves are used to create images of your heart. It provides your doctor with information about the size and shape of your heart and how well your heart's chambers and valves are working. In this test, a transducer is attached to the end of a flexible tube that is guided down you throat and into your esophagus (the tube leading from your mouth to your stomach) to get a more detailed image of your heart. Once the TEE has determined that a blood clot is not present, the cardioversion begins. Electrical Cardioversion uses a jolt of electricity to your heart either through paddles or wired patches attached to your chest. This is a controlled, usually prescheduled, procedure. This procedure is done at the hospital and you are not awake during the procedure. You usually go home the day of the procedure.  [name] will call you to arrange this procedure  Follow-Up: Please follow up with [name], NP in the [facility] clinic 1 month after TEE/Cardioverison.  Thank you for choosing [place]!!

## 2015-03-09 NOTE — Progress Notes (Signed)
Patient Care Team: [name], MD as PCP - [place], RD as Dietitian   HPI  [name] is a 59 y.o. male   seen in followup for atrial fibrillation for which he is treated with amiodarone.  There was some concern regarding dyspnea so he underwent pulmonary function testing by Dr. Gwenette Greet. It was his impression following this testing the amiodarone was not the issue.  He is having peripheral edema. He continues to have intermittent chest pains. .  Echocardiogram 7/13 demonstrated EF 20-25% with severe biatrial enlargement. Repeat echo 8/14 and demonstrated an intercurrent normalization of LV function with biatrial enlargement and mild RV dilatation    Myoview 2012 demonstrated an ejection fraction of 44% with a prior inferior wall infarct Myoview 2015 demonstrated an ejection fraction 57% with an inferior wall perfusion defect question bowel  He had right bundle branch block  He has complaints that over the last months he's had worsening shortness of breath and peripheral edema. He is noted to be in atrial fibrillation with a rapid rate today. There are no interval ECGs in the electronic record, but there have been heart rates that have been in the 80s and 90s and 1 recorded 135 in February suggesting atrial fibrillation dating back a number of months. He has had no palpitations.   TSH drawn by Dr. Dewitt Hoes is 0.1 consistent with hyperthyroidism likely treatable to the amiodarone   Past Medical History  Diagnosis Date  . Chronic pain syndrome   . DYSKINESIA, ESOPHAGUS   . FIBROMYALGIA   . HYPERLIPIDEMIA     but unable to take any meds  . INSOMNIA     takes Ambien nightly  . LOW BACK PAIN     DDD and Herniated disc  . ORCHITIS   . Rash and other nonspecific skin eruption 04/12/2009    no cause found saw dermatologists x 2 and allergist  . Atrial fibrillation     takes Coumadin daily along with Pacerone  . Cardiomyopathy     Myoview with prior infarct-  2012; EF 25% TEE July 2013  . H/O hiatal hernia   . Myocardial infarction     hx of MI  . ANXIETY     takes Xanax nightly  . Muscle spasm     takes Zanaflex daily as needed  . ASTHMA     Albuterol prn and Advair daily;also takes Prednisone daily  . GERD     takes Omeprazole daily  . DIABETES MELLITUS, TYPE II     takes Metformin daily  . HYPERTENSION     takes Lisinopril daily  . Peripheral edema     takes Lasix daily  . CHF (congestive heart failure)     takes Furosemide daily  . COPD (chronic obstructive pulmonary disease)   . O2 dependent     uses oxygen 2 and 1/2 liters per [place] at hs  . Shortness of breath     lying,sitting,exertion  . SLEEP APNEA, OBSTRUCTIVE     doesn't use CPAP  . Peripheral neuropathy   . Fibromyalgia   . Arthritis   . Joint pain   . Joint swelling   . Chronic constipation     takes OTC stool softener  . History of colon polyps   . Diverticulitis   . Urinary urgency   . Nocturia   . History of kidney stones   . Glaucoma   . DEPRESSION     takes Zoloft  and Doxepin daily  . Anemia     supposed to be taking Vit B but doesn't    Past Surgical History  Procedure Laterality Date  . Tonsillectomy    . Acne cyst removal      2 on back   . Cardioversion  04/18/2012    Procedure: CARDIOVERSION;  Surgeon: Fay Records, MD;  Location: [place];  Service: Cardiovascular;  Laterality: N/A;  . Tee without cardioversion  04/25/2012    Procedure: TRANSESOPHAGEAL ECHOCARDIOGRAM (TEE);  Surgeon: Thayer Headings, MD;  Location: [place];  Service: Cardiovascular;  Laterality: N/A;  . Cardioversion  04/25/2012    Procedure: CARDIOVERSION;  Surgeon: Thayer Headings, MD;  Location: [facility] ENDOSCOPY;  Service: Cardiovascular;  Laterality: N/A;  . Cardioversion  04/25/2012    Procedure: CARDIOVERSION;  Surgeon: Fay Records, MD;  Location: [place];  Service: Cardiovascular;  Laterality: N/A;  . Cardioversion  05/09/2012    Procedure: CARDIOVERSION;  Surgeon: Sherren Mocha, MD;  Location: [place];  Service: Cardiovascular;  Laterality: N/A;  changed from crenshaw to cooper by trish/leone-endo  . Colonoscopy    . Esophagogastroduodenoscopy    . Total knee arthroplasty Right 06/15/2014    Procedure: TOTAL KNEE ARTHROPLASTY;  Surgeon: Renette Butters, MD;  Location: [place];  Service: Orthopedics;  Laterality: Right;  . Colonoscopy with propofol N/A 10/21/2014    Procedure: COLONOSCOPY WITH PROPOFOL;  Surgeon: Ladene Artist, MD;  Location: [facility] ENDOSCOPY;  Service: Endoscopy;  Laterality: N/A;    Current Outpatient Prescriptions  Medication Sig Dispense Refill  . albuterol (PROVENTIL HFA;VENTOLIN HFA) 108 (90 BASE) MCG/ACT inhaler Inhale 2 puffs into the lungs 4 (four) times daily.    [name] ALPRAZolam (XANAX) 0.5 MG tablet TAKE 1 TABLET BY MOUTH TWICE A DAY AS NEEDED FOR ANXIETY 60 tablet 2  . amiodarone (PACERONE) 100 MG tablet Take 1 tablet (100 mg total) by mouth daily. 90 tablet 0  . carisoprodol (SOMA) 350 MG tablet Take 350 mg by mouth 3 (three) times daily as needed for muscle spasms.    . clobetasol cream (TEMOVATE) 1.65 % Apply 1 application topically 2 (two) times daily.    . digoxin (LANOXIN) 0.125 MG tablet Take 1 tablet (125 mcg total) by mouth daily. 30 tablet 3  . diphenhydrAMINE (BENADRYL) 50 MG tablet Take 1 tablet (50 mg total) by mouth every 6 (six) hours as needed for itching. 30 tablet 2  . doxepin (SINEQUAN) 25 MG capsule Take 25 mg by mouth 2 (two) times daily.    . Fluticasone-Salmeterol (ADVAIR) 250-50 MCG/DOSE AEPB Inhale 1-2 puffs into the lungs daily.    . furosemide (LASIX) 40 MG tablet TAKE 1 TABLET BY MOUTH TWICE A DAY 180 tablet 0  . hydrocortisone (ANUSOL-HC) 25 MG suppository Place 1 suppository (25 mg total) rectally every 12 (twelve) hours. 12 suppository 1  . hydrocortisone 2.5 % cream Apply topically 2 (two) times daily. 30 g 5  . hydrOXYzine (ATARAX/VISTARIL) 25 MG tablet TAKE 1 TABLET BY MOUTH 3 TIMES A DAY FOR ITCHING 30 tablet  1  . ibuprofen (ADVIL,MOTRIN) 200 MG tablet Take 200 mg by mouth every 6 (six) hours as needed for mild pain.     . Linaclotide (LINZESS) 145 MCG CAPS capsule Take 145 mcg by mouth daily.    [name] lisinopril (PRINIVIL,ZESTRIL) 5 MG tablet TAKE 1 TABLET (5 MG TOTAL) BY MOUTH DAILY. 30 tablet 3  . Loratadine 10 MG CAPS Take 1 capsule by mouth daily.     [name]  metFORMIN (GLUCOPHAGE) 500 MG tablet Take 500 mg by mouth 2 (two) times daily with a meal.    . omeprazole (PRILOSEC) 20 MG capsule Take 20 mg by mouth 2 (two) times daily before a meal.    . polyethylene glycol powder (GLYCOLAX/MIRALAX) powder Take 255 g by mouth daily. 255 g 3  . PROAIR HFA 108 (90 BASE) MCG/ACT inhaler INHALE 2 PUFFS INTO THE LUNGS 4 TIMES A DAY 8.5 Inhaler 5  . sertraline (ZOLOFT) 100 MG tablet Take 2 tablets (200 mg total) by mouth at bedtime. 180 tablet 3  . tiZANidine (ZANAFLEX) 2 MG tablet Take 1 tablet (2 mg total) by mouth 3 (three) times daily as needed for muscle spasms. 30 tablet 4  . Tolnaftate POWD Apply 1 application topically daily.     . traZODone (DESYREL) 100 MG tablet Take 50 mg by mouth at bedtime as needed. For sleep  1  . warfarin (COUMADIN) 5 MG tablet Take as directed by anticoagulation clinic 30 tablet 3  . witch hazel-glycerin (TUCKS) pad Apply 1 application topically as needed for itching or irritation.     [name] zolpidem (AMBIEN) 10 MG tablet TAKE 1 TABLET BY MOUTH AT BEDTIME AS NEEDED FOR SLEEP 30 tablet 5  . [DISCONTINUED] atorvastatin (LIPITOR) 20 MG tablet Take 1 tablet (20 mg total) by mouth daily. 30 tablet 11  . [DISCONTINUED] gabapentin (NEURONTIN) 300 MG capsule Take 1 capsule (300 mg total) by mouth 3 (three) times daily. 90 capsule 5   No current facility-administered medications for this visit.    Allergies  Allergen Reactions  . Pravastatin Sodium Other (See Comments)    myalgia  . Simvastatin     REACTION: severe leg cramps  . Statins Other (See Comments)    myalgia  . Tape Other (See  Comments)    Skin Tears Use Paper Tape Only    Review of Systems negative except from HPI and PMH  Physical Exam BP 110/70 mmHg  Pulse 109  Ht 5\' 10"  (1.778 m)  Wt 334 lb (151.501 kg)  BMI 47.92 kg/m2 Well developed andmorbidly obese in no acute distress HENT normal E scleral and icterus clear JVP not discernible Neck Supple Clear to ausculation Irregularly  irregular rate and rhythm, no murmurs gallops or rub Soft with active bowel sounds No clubbing cyanosis  2+Edema Alert and oriented, grossly normal motor and sensory function Affect flat Skin Warm and Dry  ECG demonstrates sinus rhythm at 64 with right bundle branch block and Northwest axis intervals 20/17/45  Assessment and  Plan  Ischemic heart disease with prior MI by Myoview  HFpEF   Cardiomyopathy-ischemic/nonischemic- resolved  Atrial fibrillation persistent  Dyspnea on exertion  Hyperthyroid     The patient presents with persistent atrial fibrillation with a rapid rate in the context of 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 For right now getting his heart rate slowed down and getting him back into sinus rhythm I think is a higher priority. Hence, we will begin him on a beta blocker for both rate control in general and in the context of his thyrotoxicosis. We will continue his digoxin until we confirm that his ejection fraction is normal; at that time we will stop it.  We will also increase his amiodarone from 100 daily to 200 twice a day so as to help promote improved drug level to maintain sinus rhythm. His INR will need to be rechecked next week with anticipation TE guided cardioversion; he underwent colonoscopy last week and his Coumadin was  discontinued for this procedure.  As noted above we need reassessment of left ventricular function which will be accomplished at the time of his TEE  Outside records from [facility] and [facility] primary care were reviewed demonstrating findings as  above.

## 2015-03-11 ENCOUNTER — Telehealth: Payer: Self-pay | Admitting: *Deleted

## 2015-03-11 NOTE — Telephone Encounter (Signed)
lmtcb  (scheduled TEE/DCCV for Tuesday.  Left message advising to call me back before 5pm, otherwise will have to cancel procedure for Tuesday)

## 2015-03-11 NOTE — Telephone Encounter (Signed)
Patient called back to review instructions for DCCV next week. Informed patient scheduled for 5/31, be at the hospital at 11:30 a.m.. Enter through [employer], Entrance A. NPO after 6 am, may eat light breakfast before 6 am.  Patient instructed not to take his Metformin if he wasn't going to eat breakfast. Instructed he will need someone to take him home after procedure. Patient verbalized understanding and agreeable to plan.   Advised to call the [place] office at [phone] if you have any questions, problems or concerns.

## 2015-03-13 DIAGNOSIS — G4733 Obstructive sleep apnea (adult) (pediatric): Secondary | ICD-10-CM | POA: Diagnosis not present

## 2015-03-15 ENCOUNTER — Ambulatory Visit (HOSPITAL_COMMUNITY)
Admission: RE | Admit: 2015-03-15 | Discharge: 2015-03-15 | Disposition: A | Discharge status: Home / Self Care | Payer: Commercial Managed Care - HMO | Source: Ambulatory Visit | Attending: Cardiovascular Disease | Admitting: Cardiovascular Disease

## 2015-03-15 ENCOUNTER — Encounter (HOSPITAL_COMMUNITY): Payer: Self-pay | Admitting: *Deleted

## 2015-03-15 ENCOUNTER — Other Ambulatory Visit: Payer: Self-pay

## 2015-03-15 ENCOUNTER — Ambulatory Visit (HOSPITAL_COMMUNITY): Payer: Commercial Managed Care - HMO | Admitting: Anesthesiology

## 2015-03-15 ENCOUNTER — Encounter (HOSPITAL_COMMUNITY)
Admission: RE | Disposition: A | Discharge status: Home / Self Care | Payer: Self-pay | Source: Ambulatory Visit | Attending: Cardiovascular Disease

## 2015-03-15 DIAGNOSIS — Z86010 Personal history of colonic polyps: Secondary | ICD-10-CM | POA: Insufficient documentation

## 2015-03-15 DIAGNOSIS — M797 Fibromyalgia: Secondary | ICD-10-CM | POA: Diagnosis not present

## 2015-03-15 DIAGNOSIS — I10 Essential (primary) hypertension: Secondary | ICD-10-CM | POA: Diagnosis not present

## 2015-03-15 DIAGNOSIS — Z7901 Long term (current) use of anticoagulants: Secondary | ICD-10-CM | POA: Diagnosis not present

## 2015-03-15 DIAGNOSIS — G629 Polyneuropathy, unspecified: Secondary | ICD-10-CM | POA: Insufficient documentation

## 2015-03-15 DIAGNOSIS — E119 Type 2 diabetes mellitus without complications: Secondary | ICD-10-CM | POA: Diagnosis not present

## 2015-03-15 DIAGNOSIS — M1990 Unspecified osteoarthritis, unspecified site: Secondary | ICD-10-CM | POA: Diagnosis not present

## 2015-03-15 DIAGNOSIS — D649 Anemia, unspecified: Secondary | ICD-10-CM | POA: Insufficient documentation

## 2015-03-15 DIAGNOSIS — H409 Unspecified glaucoma: Secondary | ICD-10-CM | POA: Diagnosis not present

## 2015-03-15 DIAGNOSIS — G4733 Obstructive sleep apnea (adult) (pediatric): Secondary | ICD-10-CM | POA: Insufficient documentation

## 2015-03-15 DIAGNOSIS — I481 Persistent atrial fibrillation: Secondary | ICD-10-CM | POA: Diagnosis not present

## 2015-03-15 DIAGNOSIS — E0590 Thyrotoxicosis, unspecified without thyrotoxic crisis or storm: Secondary | ICD-10-CM | POA: Insufficient documentation

## 2015-03-15 DIAGNOSIS — I4510 Unspecified right bundle-branch block: Secondary | ICD-10-CM | POA: Diagnosis not present

## 2015-03-15 DIAGNOSIS — I340 Nonrheumatic mitral (valve) insufficiency: Secondary | ICD-10-CM | POA: Diagnosis not present

## 2015-03-15 DIAGNOSIS — I255 Ischemic cardiomyopathy: Secondary | ICD-10-CM | POA: Insufficient documentation

## 2015-03-15 DIAGNOSIS — Z888 Allergy status to other drugs, medicaments and biological substances status: Secondary | ICD-10-CM | POA: Insufficient documentation

## 2015-03-15 DIAGNOSIS — I509 Heart failure, unspecified: Secondary | ICD-10-CM | POA: Diagnosis not present

## 2015-03-15 DIAGNOSIS — I252 Old myocardial infarction: Secondary | ICD-10-CM | POA: Insufficient documentation

## 2015-03-15 DIAGNOSIS — E785 Hyperlipidemia, unspecified: Secondary | ICD-10-CM | POA: Diagnosis not present

## 2015-03-15 DIAGNOSIS — Z87442 Personal history of urinary calculi: Secondary | ICD-10-CM | POA: Insufficient documentation

## 2015-03-15 DIAGNOSIS — F329 Major depressive disorder, single episode, unspecified: Secondary | ICD-10-CM | POA: Diagnosis not present

## 2015-03-15 DIAGNOSIS — J45909 Unspecified asthma, uncomplicated: Secondary | ICD-10-CM | POA: Diagnosis not present

## 2015-03-15 DIAGNOSIS — Z79899 Other long term (current) drug therapy: Secondary | ICD-10-CM | POA: Diagnosis not present

## 2015-03-15 DIAGNOSIS — Z9981 Dependence on supplemental oxygen: Secondary | ICD-10-CM | POA: Insufficient documentation

## 2015-03-15 DIAGNOSIS — Z87891 Personal history of nicotine dependence: Secondary | ICD-10-CM | POA: Diagnosis not present

## 2015-03-15 DIAGNOSIS — I259 Chronic ischemic heart disease, unspecified: Secondary | ICD-10-CM | POA: Diagnosis not present

## 2015-03-15 DIAGNOSIS — K5900 Constipation, unspecified: Secondary | ICD-10-CM | POA: Diagnosis not present

## 2015-03-15 DIAGNOSIS — G4700 Insomnia, unspecified: Secondary | ICD-10-CM | POA: Insufficient documentation

## 2015-03-15 DIAGNOSIS — Z6841 Body Mass Index (BMI) 40.0 and over, adult: Secondary | ICD-10-CM | POA: Diagnosis not present

## 2015-03-15 DIAGNOSIS — J449 Chronic obstructive pulmonary disease, unspecified: Secondary | ICD-10-CM | POA: Insufficient documentation

## 2015-03-15 DIAGNOSIS — R609 Edema, unspecified: Secondary | ICD-10-CM | POA: Insufficient documentation

## 2015-03-15 DIAGNOSIS — G894 Chronic pain syndrome: Secondary | ICD-10-CM | POA: Diagnosis not present

## 2015-03-15 DIAGNOSIS — K219 Gastro-esophageal reflux disease without esophagitis: Secondary | ICD-10-CM | POA: Diagnosis not present

## 2015-03-15 DIAGNOSIS — F419 Anxiety disorder, unspecified: Secondary | ICD-10-CM | POA: Insufficient documentation

## 2015-03-15 DIAGNOSIS — I4891 Unspecified atrial fibrillation: Secondary | ICD-10-CM

## 2015-03-15 HISTORY — PX: CARDIOVERSION: SHX1299

## 2015-03-15 HISTORY — PX: TEE WITHOUT CARDIOVERSION: SHX5443

## 2015-03-15 LAB — POCT I-STAT 4, (NA,K, GLUC, HGB,HCT)
Glucose, Bld: 90 mg/dL (ref 65–99)
HCT: 29 % — ABNORMAL LOW (ref 39.0–52.0)
HEMOGLOBIN: 9.9 g/dL — AB (ref 13.0–17.0)
Potassium: 3.9 mmol/L (ref 3.5–5.1)
Sodium: 142 mmol/L (ref 135–145)

## 2015-03-15 LAB — PROTIME-INR
INR: 3.96 — ABNORMAL HIGH (ref 0.00–1.49)
PROTHROMBIN TIME: 37.7 s — AB (ref 11.6–15.2)

## 2015-03-15 LAB — GLUCOSE, CAPILLARY: Glucose-Capillary: 85 mg/dL (ref 65–99)

## 2015-03-15 SURGERY — ECHOCARDIOGRAM, TRANSESOPHAGEAL
Anesthesia: General

## 2015-03-15 MED ORDER — FENTANYL CITRATE (PF) 100 MCG/2ML IJ SOLN
INTRAMUSCULAR | Status: DC | PRN
  Administered 2015-03-15 (×2): 50 ug via INTRAVENOUS

## 2015-03-15 MED ORDER — BUTAMBEN-TETRACAINE-BENZOCAINE 2-2-14 % EX AERO
INHALATION_SPRAY | CUTANEOUS | Status: DC | PRN
  Administered 2015-03-15: 2 via TOPICAL

## 2015-03-15 MED ORDER — PROPOFOL INFUSION 10 MG/ML OPTIME
INTRAVENOUS | Status: DC | PRN
  Administered 2015-03-15: 50 ug/kg/min via INTRAVENOUS

## 2015-03-15 MED ORDER — LACTATED RINGERS IV SOLN
INTRAVENOUS | Status: DC
  Administered 2015-03-15: 12:00:00 via INTRAVENOUS

## 2015-03-15 MED ORDER — MIDAZOLAM HCL 5 MG/5ML IJ SOLN
INTRAMUSCULAR | Status: DC | PRN
  Administered 2015-03-15: 2 mg via INTRAVENOUS

## 2015-03-15 MED ORDER — PROPOFOL 10 MG/ML IV BOLUS
INTRAVENOUS | Status: DC | PRN
  Administered 2015-03-15 (×2): 30 mg via INTRAVENOUS
  Administered 2015-03-15: 20 mg via INTRAVENOUS
  Administered 2015-03-15 (×4): 30 mg via INTRAVENOUS

## 2015-03-15 NOTE — Anesthesia Postprocedure Evaluation (Signed)
  Anesthesia Post-op Note  Patient: [name]  Procedure(s) Performed: Procedure(s): TRANSESOPHAGEAL ECHOCARDIOGRAM (TEE) (N/A) CARDIOVERSION (N/A)  Patient Location: PACU  Anesthesia Type: General   Level of Consciousness: awake, alert  and oriented  Airway and Oxygen Therapy: Patient Spontanous Breathing  Post-op Pain: none  Post-op Assessment: Post-op Vital signs reviewed  Post-op Vital Signs: Reviewed  Last Vitals:  Filed Vitals:   03/15/15 1340  BP: 115/53  Pulse: 81  Temp:   Resp: 19    Complications: No apparent anesthesia complications

## 2015-03-15 NOTE — Interval H&P Note (Signed)
History and Physical Interval Note:  03/15/2015 12:40 PM  [name]  has presented today for surgery, with the diagnosis of AFIB  The various methods of treatment have been discussed with the patient and family. After consideration of risks, benefits and other options for treatment, the patient has consented to  Procedure(s): TRANSESOPHAGEAL ECHOCARDIOGRAM (TEE) (N/A) CARDIOVERSION (N/A) as a surgical intervention .  The patient's history has been reviewed, patient examined, no change in status, stable for surgery.  I have reviewed the patient's chart and labs.  Questions were answered to the patient's satisfaction.    INR is now theraputic.    Nahser, Wonda Cheng

## 2015-03-15 NOTE — H&P (View-Only) (Signed)
Patient Care Team: [name], MD as PCP - [place], RD as Dietitian   HPI  [name] is a 59 y.o. male   seen in followup for atrial fibrillation for which he is treated with amiodarone.  There was some concern regarding dyspnea so he underwent pulmonary function testing by Dr. Gwenette Greet. It was his impression following this testing the amiodarone was not the issue.  He is having peripheral edema. He continues to have intermittent chest pains. .  Echocardiogram 7/13 demonstrated EF 20-25% with severe biatrial enlargement. Repeat echo 8/14 and demonstrated an intercurrent normalization of LV function with biatrial enlargement and mild RV dilatation    Myoview 2012 demonstrated an ejection fraction of 44% with a prior inferior wall infarct Myoview 2015 demonstrated an ejection fraction 57% with an inferior wall perfusion defect question bowel  He had right bundle branch block  He has complaints that over the last months he's had worsening shortness of breath and peripheral edema. He is noted to be in atrial fibrillation with a rapid rate today. There are no interval ECGs in the electronic record, but there have been heart rates that have been in the 80s and 90s and 1 recorded 135 in February suggesting atrial fibrillation dating back a number of months. He has had no palpitations.   TSH drawn by Dr. Dewitt Hoes is 0.1 consistent with hyperthyroidism likely treatable to the amiodarone   Past Medical History  Diagnosis Date  . Chronic pain syndrome   . DYSKINESIA, ESOPHAGUS   . FIBROMYALGIA   . HYPERLIPIDEMIA     but unable to take any meds  . INSOMNIA     takes Ambien nightly  . LOW BACK PAIN     DDD and Herniated disc  . ORCHITIS   . Rash and other nonspecific skin eruption 04/12/2009    no cause found saw dermatologists x 2 and allergist  . Atrial fibrillation     takes Coumadin daily along with Pacerone  . Cardiomyopathy     Myoview with prior infarct-  2012; EF 25% TEE July 2013  . H/O hiatal hernia   . Myocardial infarction     hx of MI  . ANXIETY     takes Xanax nightly  . Muscle spasm     takes Zanaflex daily as needed  . ASTHMA     Albuterol prn and Advair daily;also takes Prednisone daily  . GERD     takes Omeprazole daily  . DIABETES MELLITUS, TYPE II     takes Metformin daily  . HYPERTENSION     takes Lisinopril daily  . Peripheral edema     takes Lasix daily  . CHF (congestive heart failure)     takes Furosemide daily  . COPD (chronic obstructive pulmonary disease)   . O2 dependent     uses oxygen 2 and 1/2 liters per [place] at hs  . Shortness of breath     lying,sitting,exertion  . SLEEP APNEA, OBSTRUCTIVE     doesn't use CPAP  . Peripheral neuropathy   . Fibromyalgia   . Arthritis   . Joint pain   . Joint swelling   . Chronic constipation     takes OTC stool softener  . History of colon polyps   . Diverticulitis   . Urinary urgency   . Nocturia   . History of kidney stones   . Glaucoma   . DEPRESSION     takes Zoloft  and Doxepin daily  . Anemia     supposed to be taking Vit B but doesn't    Past Surgical History  Procedure Laterality Date  . Tonsillectomy    . Acne cyst removal      2 on back   . Cardioversion  04/18/2012    Procedure: CARDIOVERSION;  Surgeon: Fay Records, MD;  Location: [place];  Service: Cardiovascular;  Laterality: N/A;  . Tee without cardioversion  04/25/2012    Procedure: TRANSESOPHAGEAL ECHOCARDIOGRAM (TEE);  Surgeon: Thayer Headings, MD;  Location: [place];  Service: Cardiovascular;  Laterality: N/A;  . Cardioversion  04/25/2012    Procedure: CARDIOVERSION;  Surgeon: Thayer Headings, MD;  Location: [facility] ENDOSCOPY;  Service: Cardiovascular;  Laterality: N/A;  . Cardioversion  04/25/2012    Procedure: CARDIOVERSION;  Surgeon: Fay Records, MD;  Location: [place];  Service: Cardiovascular;  Laterality: N/A;  . Cardioversion  05/09/2012    Procedure: CARDIOVERSION;  Surgeon: Sherren Mocha, MD;  Location: [place];  Service: Cardiovascular;  Laterality: N/A;  changed from crenshaw to cooper by trish/leone-endo  . Colonoscopy    . Esophagogastroduodenoscopy    . Total knee arthroplasty Right 06/15/2014    Procedure: TOTAL KNEE ARTHROPLASTY;  Surgeon: Renette Butters, MD;  Location: [place];  Service: Orthopedics;  Laterality: Right;  . Colonoscopy with propofol N/A 10/21/2014    Procedure: COLONOSCOPY WITH PROPOFOL;  Surgeon: Ladene Artist, MD;  Location: [facility] ENDOSCOPY;  Service: Endoscopy;  Laterality: N/A;    Current Outpatient Prescriptions  Medication Sig Dispense Refill  . albuterol (PROVENTIL HFA;VENTOLIN HFA) 108 (90 BASE) MCG/ACT inhaler Inhale 2 puffs into the lungs 4 (four) times daily.    [name] ALPRAZolam (XANAX) 0.5 MG tablet TAKE 1 TABLET BY MOUTH TWICE A DAY AS NEEDED FOR ANXIETY 60 tablet 2  . amiodarone (PACERONE) 100 MG tablet Take 1 tablet (100 mg total) by mouth daily. 90 tablet 0  . carisoprodol (SOMA) 350 MG tablet Take 350 mg by mouth 3 (three) times daily as needed for muscle spasms.    . clobetasol cream (TEMOVATE) 8.85 % Apply 1 application topically 2 (two) times daily.    . digoxin (LANOXIN) 0.125 MG tablet Take 1 tablet (125 mcg total) by mouth daily. 30 tablet 3  . diphenhydrAMINE (BENADRYL) 50 MG tablet Take 1 tablet (50 mg total) by mouth every 6 (six) hours as needed for itching. 30 tablet 2  . doxepin (SINEQUAN) 25 MG capsule Take 25 mg by mouth 2 (two) times daily.    . Fluticasone-Salmeterol (ADVAIR) 250-50 MCG/DOSE AEPB Inhale 1-2 puffs into the lungs daily.    . furosemide (LASIX) 40 MG tablet TAKE 1 TABLET BY MOUTH TWICE A DAY 180 tablet 0  . hydrocortisone (ANUSOL-HC) 25 MG suppository Place 1 suppository (25 mg total) rectally every 12 (twelve) hours. 12 suppository 1  . hydrocortisone 2.5 % cream Apply topically 2 (two) times daily. 30 g 5  . hydrOXYzine (ATARAX/VISTARIL) 25 MG tablet TAKE 1 TABLET BY MOUTH 3 TIMES A DAY FOR ITCHING 30 tablet  1  . ibuprofen (ADVIL,MOTRIN) 200 MG tablet Take 200 mg by mouth every 6 (six) hours as needed for mild pain.     . Linaclotide (LINZESS) 145 MCG CAPS capsule Take 145 mcg by mouth daily.    [name] lisinopril (PRINIVIL,ZESTRIL) 5 MG tablet TAKE 1 TABLET (5 MG TOTAL) BY MOUTH DAILY. 30 tablet 3  . Loratadine 10 MG CAPS Take 1 capsule by mouth daily.     [name]  metFORMIN (GLUCOPHAGE) 500 MG tablet Take 500 mg by mouth 2 (two) times daily with a meal.    . omeprazole (PRILOSEC) 20 MG capsule Take 20 mg by mouth 2 (two) times daily before a meal.    . polyethylene glycol powder (GLYCOLAX/MIRALAX) powder Take 255 g by mouth daily. 255 g 3  . PROAIR HFA 108 (90 BASE) MCG/ACT inhaler INHALE 2 PUFFS INTO THE LUNGS 4 TIMES A DAY 8.5 Inhaler 5  . sertraline (ZOLOFT) 100 MG tablet Take 2 tablets (200 mg total) by mouth at bedtime. 180 tablet 3  . tiZANidine (ZANAFLEX) 2 MG tablet Take 1 tablet (2 mg total) by mouth 3 (three) times daily as needed for muscle spasms. 30 tablet 4  . Tolnaftate POWD Apply 1 application topically daily.     . traZODone (DESYREL) 100 MG tablet Take 50 mg by mouth at bedtime as needed. For sleep  1  . warfarin (COUMADIN) 5 MG tablet Take as directed by anticoagulation clinic 30 tablet 3  . witch hazel-glycerin (TUCKS) pad Apply 1 application topically as needed for itching or irritation.     [name] zolpidem (AMBIEN) 10 MG tablet TAKE 1 TABLET BY MOUTH AT BEDTIME AS NEEDED FOR SLEEP 30 tablet 5  . [DISCONTINUED] atorvastatin (LIPITOR) 20 MG tablet Take 1 tablet (20 mg total) by mouth daily. 30 tablet 11  . [DISCONTINUED] gabapentin (NEURONTIN) 300 MG capsule Take 1 capsule (300 mg total) by mouth 3 (three) times daily. 90 capsule 5   No current facility-administered medications for this visit.    Allergies  Allergen Reactions  . Pravastatin Sodium Other (See Comments)    myalgia  . Simvastatin     REACTION: severe leg cramps  . Statins Other (See Comments)    myalgia  . Tape Other (See  Comments)    Skin Tears Use Paper Tape Only    Review of Systems negative except from HPI and PMH  Physical Exam BP 110/70 mmHg  Pulse 109  Ht 5\' 10"  (1.778 m)  Wt 334 lb (151.501 kg)  BMI 47.92 kg/m2 Well developed andmorbidly obese in no acute distress HENT normal E scleral and icterus clear JVP not discernible Neck Supple Clear to ausculation Irregularly  irregular rate and rhythm, no murmurs gallops or rub Soft with active bowel sounds No clubbing cyanosis  2+Edema Alert and oriented, grossly normal motor and sensory function Affect flat Skin Warm and Dry  ECG demonstrates sinus rhythm at 64 with right bundle branch block and Northwest axis intervals 20/17/45  Assessment and  Plan  Ischemic heart disease with prior MI by Myoview  HFpEF   Cardiomyopathy-ischemic/nonischemic- resolved  Atrial fibrillation persistent  Dyspnea on exertion  Hyperthyroid     The patient presents with persistent atrial fibrillation with a rapid rate in the context of 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 For right now getting his heart rate slowed down and getting him back into sinus rhythm I think is a higher priority. Hence, we will begin him on a beta blocker for both rate control in general and in the context of his thyrotoxicosis. We will continue his digoxin until we confirm that his ejection fraction is normal; at that time we will stop it.  We will also increase his amiodarone from 100 daily to 200 twice a day so as to help promote improved drug level to maintain sinus rhythm. His INR will need to be rechecked next week with anticipation TE guided cardioversion; he underwent colonoscopy last week and his Coumadin was  discontinued for this procedure.  As noted above we need reassessment of left ventricular function which will be accomplished at the time of his TEE  Outside records from [facility] and [facility] primary care were reviewed demonstrating findings as  above.

## 2015-03-15 NOTE — Transfer of Care (Signed)
Immediate Anesthesia Transfer of Care Note  Patient: [name]  Procedure(s) Performed: Procedure(s): TRANSESOPHAGEAL ECHOCARDIOGRAM (TEE) (N/A) CARDIOVERSION (N/A)  Patient Location: PACU and Endoscopy Unit  Anesthesia Type:General  Level of Consciousness: awake, alert , oriented and sedated  Airway & Oxygen Therapy: Patient Spontanous Breathing and Patient connected to nasal cannula oxygen  Post-op Assessment: Report given to RN, Post -op Vital signs reviewed and stable and Patient moving all extremities  Post vital signs: Reviewed and stable  Last Vitals:  Filed Vitals:   03/15/15 1305  BP: 144/56  Pulse:   Temp:   Resp:     Complications: No apparent anesthesia complications

## 2015-03-15 NOTE — Anesthesia Preprocedure Evaluation (Addendum)
Anesthesia Evaluation  Patient identified by MRN, date of birth, ID band Patient awake    Reviewed: Allergy & Precautions, NPO status , Patient's Chart, lab work & pertinent test results  Airway Mallampati: I  TM Distance: >3 FB Neck ROM: Full    Dental  (+) Teeth Intact, Dental Advisory Given   Pulmonary asthma , sleep apnea and Oxygen sleep apnea , former smoker,  breath sounds clear to auscultation        Cardiovascular hypertension, Pt. on medications Rhythm:Irregular Rate:Normal     Neuro/Psych    GI/Hepatic GERD-  ,  Endo/Other  diabetes, Well Controlled, Type 2, Oral Hypoglycemic AgentsMorbid obesity  Renal/GU      Musculoskeletal   Abdominal   Peds  Hematology   Anesthesia Other Findings   Reproductive/Obstetrics                            Anesthesia Physical Anesthesia Plan  ASA: III  Anesthesia Plan: General   Post-op Pain Management:    Induction: Intravenous  Airway Management Planned: Simple Face Mask and Mask  Additional Equipment:   Intra-op Plan:   Post-operative Plan:   Informed Consent: I have reviewed the patients History and Physical, chart, labs and discussed the procedure including the risks, benefits and alternatives for the proposed anesthesia with the patient or authorized representative who has indicated his/her understanding and acceptance.   Dental advisory given  Plan Discussed with: CRNA, Anesthesiologist and Surgeon  Anesthesia Plan Comments:         Anesthesia Quick Evaluation

## 2015-03-15 NOTE — Discharge Instructions (Signed)
Electrical Cardioversion, Care After °Refer to this sheet in the next few weeks. These instructions provide you with information on caring for yourself after your procedure. Your health care provider may also give you more specific instructions. Your treatment has been planned according to current medical practices, but problems sometimes occur. Call your health care provider if you have any problems or questions after your procedure. °WHAT TO EXPECT AFTER THE PROCEDURE °After your procedure, it is typical to have the following sensations: °· Some redness on the skin where the shocks were delivered. If this is tender, a sunburn lotion or hydrocortisone cream may help. °· Possible return of an abnormal heart rhythm within hours or days after the procedure. °HOME CARE INSTRUCTIONS °· Take medicines only as directed by your health care provider. Be sure you understand how and when to take your medicine. °· Learn how to feel your pulse and check it often. °· Limit your activity for 48 hours after the procedure or as directed by your health care provider. °· Avoid or minimize caffeine and other stimulants as directed by your health care provider. °SEEK MEDICAL CARE IF: °· You feel like your heart is beating too fast or your pulse is not regular. °· You have any questions about your medicines. °· You have bleeding that will not stop. °SEEK IMMEDIATE MEDICAL CARE IF: °· You are dizzy or feel faint. °· It is hard to breathe or you feel short of breath. °· There is a change in discomfort in your chest. °· Your speech is slurred or you have trouble moving an arm or leg on one side of your body. °· You get a serious muscle cramp that does not go away. °· Your fingers or toes turn cold or blue. °Document Released: 07/22/2013 Document Revised: 02/15/2014 Document Reviewed: 07/22/2013 °ExitCare® Patient Information ©2015 [employer]. This information is not intended to replace advice given to you by your health care provider.  Make sure you discuss any questions you have with your health care provider. ° ° °Monitored Anesthesia Care °Monitored anesthesia care is an anesthesia service for a medical procedure. Anesthesia is the loss of the ability to feel pain. It is produced by medicines called anesthetics. It may affect a small area of your body (local anesthesia), a large area of your body (regional anesthesia), or your entire body (general anesthesia). The need for monitored anesthesia care depends your procedure, your condition, and the potential need for regional or general anesthesia. It is often provided during procedures where:  °· General anesthesia may be needed if there are complications. This is because you need special care when you are under general anesthesia.   °· You will be under local or regional anesthesia. This is so that you are able to have higher levels of anesthesia if needed.   °· You will receive calming medicines (sedatives). This is especially the case if sedatives are given to put you in a semi-conscious state of relaxation (deep sedation). This is because the amount of sedative needed to produce this state can be hard to predict. Too much of a sedative can produce general anesthesia. °Monitored anesthesia care is performed by one or more health care providers who have special training in all types of anesthesia. You will need to meet with these health care providers before your procedure. During this meeting, they will ask you about your medical history. They will also give you instructions to follow. (For example, you will need to stop eating and drinking before your procedure.   You may also need to stop or change medicines you are taking.) During your procedure, your health care providers will stay with you. They will:  °· Watch your condition. This includes watching your blood pressure, breathing, and level of pain.   °· Diagnose and treat problems that occur.   °· Give medicines if they are needed. These  may include calming medicines (sedatives) and anesthetics.   °· Make sure you are comfortable.   °Having monitored anesthesia care does not necessarily mean that you will be under anesthesia. It does mean that your health care providers will be able to manage anesthesia if you need it or if it occurs. It also means that you will be able to have a different type of anesthesia than you are having if you need it. When your procedure is complete, your health care providers will continue to watch your condition. They will make sure any medicines wear off before you are allowed to go home.  °Document Released: 06/27/2005 Document Revised: 02/15/2014 Document Reviewed: 11/12/2012 °ExitCare® Patient Information ©2015 [employer]. This information is not intended to replace advice given to you by your health care provider. Make sure you discuss any questions you have with your health care provider. ° °

## 2015-03-15 NOTE — Progress Notes (Signed)
  Echocardiogram Echocardiogram Transesophageal has been performed.  [name] M 03/15/2015, 1:16 PM

## 2015-03-15 NOTE — Addendum Note (Signed)
Addendum  created 03/15/15 1420 by Scheryl Darter, CRNA   Modules edited: Anesthesia Attestations

## 2015-03-15 NOTE — CV Procedure (Signed)
    Transesophageal Echocardiogram Note  DORSE LOCY [id number] 1956/06/15  Procedure: Transesophageal Echocardiogram Indications: atrial fib   Procedure Details Consent: Obtained Time Out: Verified patient identification, verified procedure, site/side was marked, verified correct patient position, special equipment/implants available, Radiology Safety Procedures followed,  medications/allergies/relevent history reviewed, required imaging and test results available.  Performed  Medications: Propofol 230 mg total for TEE and cardioversion   Left Ventrical:  Normal LV function   Mitral Valve: mild - moderate MR   Aortic Valve: normal   Tricuspid Valve: mild TR   Pulmonic Valve: normal   Left Atrium/ Left atrial appendage: no thrombi   Atrial septum: no ASD or PFO by color flow   Aorta: normal    Complications: No apparent complications Patient did tolerate procedure well.  We proceeded with the cardioversion at this point      Cardioversion Note  KAEVION SINCLAIR [id number] Nov 17, 1955  Procedure: DC Cardioversion Indications: atrial fib   Procedure Details Consent: Obtained Time Out: Verified patient identification, verified procedure, site/side was marked, verified correct patient position, special equipment/implants available, Radiology Safety Procedures followed,  medications/allergies/relevent history reviewed, required imaging and test results available.  Performed  The patient has been on adequate anticoagulation.  The patient received IV Propfol for sedation ( see above ) .  Synchronous cardioversion was performed at 200 joules.  The cardioversion was successful     Complications: No apparent complications Patient did tolerate procedure well.   Thayer Headings, Brooke Bonito., MD, [facility] 03/15/2015, 1:00 PM

## 2015-03-16 ENCOUNTER — Encounter (HOSPITAL_COMMUNITY): Payer: Self-pay | Admitting: Cardiovascular Disease

## 2015-03-22 ENCOUNTER — Telehealth: Payer: Self-pay | Admitting: Internal Medicine

## 2015-03-22 NOTE — Telephone Encounter (Signed)
Pt called because he has been having SOB for the last 3 days, and chest tightness he has a dry cough sometime specially when he is very SOB . Pt had a Cardioversion on 03/15/15. He would like to know what to do. Message Routed to [place] for F/U.

## 2015-03-22 NOTE — Telephone Encounter (Signed)
Patient will see Butch Penny tomorrow

## 2015-03-22 NOTE — Telephone Encounter (Signed)
New Message      Pt c/o Shortness Of Breath: STAT if SOB developed within the last 24 hours or pt is noticeably SOB on the phone  1. Are you currently SOB (can you hear that pt is SOB on the phone)?  Yes  2. How long have you been experiencing SOB?  3 days ago  3. Are you SOB when sitting or when up moving around? Cant walk around the house.    4. Are you currently experiencing any other symptoms? His chest is tight, not sharp, but it is dull and he has a cough. Pt stated that he had a recent cardioversion.

## 2015-03-22 NOTE — Telephone Encounter (Signed)
Scheduler to call patient to see [name], NP in AFib clinic this afternoon/tomorrow.

## 2015-03-23 ENCOUNTER — Encounter (HOSPITAL_COMMUNITY): Payer: Self-pay | Admitting: Nurse Practitioner

## 2015-03-23 ENCOUNTER — Ambulatory Visit: Payer: Commercial Managed Care - HMO | Admitting: Endocrinology

## 2015-03-23 ENCOUNTER — Ambulatory Visit (HOSPITAL_COMMUNITY)
Admission: RE | Admit: 2015-03-23 | Discharge: 2015-03-23 | Disposition: A | Discharge status: Home / Self Care | Payer: Commercial Managed Care - HMO | Source: Ambulatory Visit | Attending: Nurse Practitioner | Admitting: Nurse Practitioner

## 2015-03-23 VITALS — BP 160/98 | HR 106 | Ht 70.0 in | Wt 329.4 lb

## 2015-03-23 DIAGNOSIS — I4510 Unspecified right bundle-branch block: Secondary | ICD-10-CM | POA: Diagnosis not present

## 2015-03-23 DIAGNOSIS — I481 Persistent atrial fibrillation: Secondary | ICD-10-CM | POA: Diagnosis not present

## 2015-03-23 DIAGNOSIS — I4891 Unspecified atrial fibrillation: Secondary | ICD-10-CM | POA: Insufficient documentation

## 2015-03-23 DIAGNOSIS — I4819 Other persistent atrial fibrillation: Secondary | ICD-10-CM

## 2015-03-23 MED ORDER — RANOLAZINE ER 500 MG PO TB12: 500.0000 mg | ORAL_TABLET | Freq: Two times a day (BID) | ORAL | Status: DC

## 2015-03-23 MED ORDER — NEBIVOLOL HCL 5 MG PO TABS: 5.0000 mg | ORAL_TABLET | Freq: Every day | ORAL | Status: DC

## 2015-03-23 MED ORDER — DILTIAZEM HCL ER COATED BEADS 240 MG PO CP24: 240.0000 mg | ORAL_CAPSULE | Freq: Every day | ORAL | Status: DC

## 2015-03-23 NOTE — Patient Instructions (Signed)
Your physician has recommended you make the following change in your medication:  1)STOP digoxin 2)STOP toprol (metoprolol) 3)Start Bystolic 5mg  once a day 4)Start Cardizem 240mg  once a day 5)Start Ranexa 500mg  twice a day  Return in one week.  From [address] turn onto [address]. Go to the 3rd entrance on right as soon as you turn in you will turn right into the heart and vascular parking garage the parking code is [id number]. Elevator to 1st floor and check in at desk.

## 2015-03-24 ENCOUNTER — Encounter (HOSPITAL_COMMUNITY): Payer: Self-pay | Admitting: Nurse Practitioner

## 2015-03-24 NOTE — Progress Notes (Signed)
Patient ID: [name], male   DOB: Nov 10, 1955, 59 y.o.   MRN: [id number]     Primary Care Physician: [name], MD Referring Physician: Dr. Jolyn Nap   [name] is a 59 y.o. male with a h/o morbid obesity, HTN, CAD, COPD/Asthma,DM, CHF with prior LVD but with normalization of EF by recent echo,recent diagnosis of amiodarone induced thyrotoxicosis . He presented to Dr. Lynelle Doctor with persistent afib 5/25 and beta blocker was added, amio was doubled and he was set up for DCCV. He maintained SR x one day. He then had worsening shortness of breath along with afib poorly rate controlled. His lasix was increased and BB doubled two days ago but is becoming more short of breath with activity.   He is referred to the afib clinic today for further evaluation. It was noted that pt became significantly dyspneic with ambulation to exam room. His EKG does confirm afib with resting v rate of 106 bpm, PO 91%. Upon walking, he did not desaturate further, but v rates increased up to 180 bpm. He is comfortable at rest and sleeps in a recliner(chronic) and not not had any PND/orthopnea. Increase in diuretics has not helped his symptoms and the pt's daughter reports that he was not taking his fluid pill on a regular basis when it was doubled so therefore will go back to usual dose. He feels like his breathing has worsened since BB was increased and there is a good chance it could be causing an exacerbation of his lung issues. He had an appointment with endocrinology today to have thyroid evaluated but he cancelled for this appointment. His amiodarone was doubled prior to DCCV but now is back to once daily.He has sleep apnea and can not tolerate cpap but uses O2 at night by cannula.  Today, he is positive for symptoms of palpitations,  chest tightness with higher heart rate, shortness of breath, chronic lower extremity edema, no presyncope, syncope, or neurologic sequela. Compliant with coumadin.  Past Medical  History  Diagnosis Date  . Chronic pain syndrome   . DYSKINESIA, ESOPHAGUS   . FIBROMYALGIA   . HYPERLIPIDEMIA     but unable to take any meds  . INSOMNIA     takes Ambien nightly  . LOW BACK PAIN     DDD and Herniated disc  . ORCHITIS   . Rash and other nonspecific skin eruption 04/12/2009    no cause found saw dermatologists x 2 and allergist  . Atrial fibrillation     takes Coumadin daily along with Pacerone  . Cardiomyopathy     Myoview with prior infarct- 2012; EF 25% TEE July 2013  . H/O hiatal hernia   . Myocardial infarction     hx of MI  . ANXIETY     takes Xanax nightly  . Muscle spasm     takes Zanaflex daily as needed  . ASTHMA     Albuterol prn and Advair daily;also takes Prednisone daily  . GERD     takes Omeprazole daily  . DIABETES MELLITUS, TYPE II     takes Metformin daily  . HYPERTENSION     takes Lisinopril daily  . Peripheral edema     takes Lasix daily  . CHF (congestive heart failure)     takes Furosemide daily  . COPD (chronic obstructive pulmonary disease)   . O2 dependent     uses oxygen 2 and 1/2 liters per [place] at hs  . Shortness of breath  lying,sitting,exertion  . SLEEP APNEA, OBSTRUCTIVE     doesn't use CPAP  . Peripheral neuropathy   . Fibromyalgia   . Arthritis   . Joint pain   . Joint swelling   . Chronic constipation     takes OTC stool softener  . History of colon polyps   . Diverticulitis   . Urinary urgency   . Nocturia   . History of kidney stones   . Glaucoma   . DEPRESSION     takes Zoloft and Doxepin daily  . Anemia     supposed to be taking Vit B but doesn't   Past Surgical History  Procedure Laterality Date  . Tonsillectomy    . Acne cyst removal      2 on back   . Cardioversion  04/18/2012    Procedure: CARDIOVERSION;  Surgeon: Fay Records, MD;  Location: [place];  Service: Cardiovascular;  Laterality: N/A;  . Tee without cardioversion  04/25/2012    Procedure: TRANSESOPHAGEAL ECHOCARDIOGRAM (TEE);   Surgeon: Thayer Headings, MD;  Location: [place];  Service: Cardiovascular;  Laterality: N/A;  . Cardioversion  04/25/2012    Procedure: CARDIOVERSION;  Surgeon: Thayer Headings, MD;  Location: [facility] ENDOSCOPY;  Service: Cardiovascular;  Laterality: N/A;  . Cardioversion  04/25/2012    Procedure: CARDIOVERSION;  Surgeon: Fay Records, MD;  Location: [place];  Service: Cardiovascular;  Laterality: N/A;  . Cardioversion  05/09/2012    Procedure: CARDIOVERSION;  Surgeon: Sherren Mocha, MD;  Location: [place];  Service: Cardiovascular;  Laterality: N/A;  changed from crenshaw to cooper by trish/leone-endo  . Colonoscopy    . Esophagogastroduodenoscopy    . Total knee arthroplasty Right 06/15/2014    Procedure: TOTAL KNEE ARTHROPLASTY;  Surgeon: Renette Butters, MD;  Location: [place];  Service: Orthopedics;  Laterality: Right;  . Colonoscopy with propofol N/A 10/21/2014    Procedure: COLONOSCOPY WITH PROPOFOL;  Surgeon: Ladene Artist, MD;  Location: [facility] ENDOSCOPY;  Service: Endoscopy;  Laterality: N/A;  . Tee without cardioversion N/A 03/15/2015    Procedure: TRANSESOPHAGEAL ECHOCARDIOGRAM (TEE);  Surgeon: Thayer Headings, MD;  Location: [place];  Service: Cardiovascular;  Laterality: N/A;  . Cardioversion N/A 03/15/2015    Procedure: CARDIOVERSION;  Surgeon: Thayer Headings, MD;  Location: [facility] ENDOSCOPY;  Service: Cardiovascular;  Laterality: N/A;    Current Outpatient Prescriptions  Medication Sig Dispense Refill  . albuterol (PROVENTIL HFA;VENTOLIN HFA) 108 (90 BASE) MCG/ACT inhaler Inhale 2 puffs into the lungs 4 (four) times daily.    [name] ALPRAZolam (XANAX) 0.5 MG tablet TAKE 1 TABLET BY MOUTH TWICE A DAY AS NEEDED FOR ANXIETY (Patient taking differently: Take 0.5 mg by mouth at bedtime. ) 60 tablet 2  . amiodarone (PACERONE) 200 MG tablet Take 1 tablet (200 mg total) by mouth daily. 30 tablet 3  . b complex vitamins tablet Take 1 tablet by mouth daily.    . carisoprodol (SOMA) 350 MG tablet Take  350 mg by mouth at bedtime.     . clobetasol cream (TEMOVATE) 5.17 % Apply 1 application topically daily as needed (itching).     . diphenhydrAMINE (BENADRYL) 50 MG tablet Take 1 tablet (50 mg total) by mouth every 6 (six) hours as needed for itching. 30 tablet 2  . doxepin (SINEQUAN) 25 MG capsule Take 25 mg by mouth 2 (two) times daily.    . ferrous sulfate 325 (65 FE) MG tablet Take 325 mg by mouth daily.    [name]  Fluticasone-Salmeterol (ADVAIR) 250-50 MCG/DOSE AEPB Inhale 2 puffs into the lungs 2 (two) times daily.     . furosemide (LASIX) 80 MG tablet Take 1 tablet (80 mg total) by mouth 2 (two) times daily. 60 tablet 3  . hydrocortisone (ANUSOL-HC) 25 MG suppository Place 1 suppository (25 mg total) rectally every 12 (twelve) hours. 12 suppository 1  . hydrocortisone 2.5 % cream Apply topically 2 (two) times daily. (Patient taking differently: Apply 1 application topically 2 (two) times daily. ) 30 g 5  . hydrOXYzine (ATARAX/VISTARIL) 25 MG tablet TAKE 1 TABLET BY MOUTH 3 TIMES A DAY FOR ITCHING (Patient taking differently: TAKE 1 TABLET BY MOUTH 3 TIMES A DAY AS NEEDED FOR ITCHING) 30 tablet 1  . ibuprofen (ADVIL,MOTRIN) 200 MG tablet Take 400 mg by mouth at bedtime as needed for mild pain (pain).     . Linaclotide (LINZESS) 145 MCG CAPS capsule Take 145 mcg by mouth daily.    [name] lisinopril (PRINIVIL,ZESTRIL) 5 MG tablet TAKE 1 TABLET (5 MG TOTAL) BY MOUTH DAILY. 30 tablet 3  . Loratadine 10 MG CAPS Take 10 mg by mouth daily as needed (itching).     . metFORMIN (GLUCOPHAGE) 500 MG tablet Take 1,000 mg by mouth daily with breakfast.     . omeprazole (PRILOSEC) 20 MG capsule Take 20 mg by mouth daily with supper.     . polyethylene glycol powder (GLYCOLAX/MIRALAX) powder Take 255 g by mouth daily. (Patient taking differently: Take 17 g by mouth 2 (two) times daily as needed (constipation). Mix in 8 oz liquid and drink) 255 g 3  . PROAIR HFA 108 (90 BASE) MCG/ACT inhaler INHALE 2 PUFFS INTO THE LUNGS  4 TIMES A DAY 8.5 Inhaler 5  . sertraline (ZOLOFT) 100 MG tablet Take 2 tablets (200 mg total) by mouth at bedtime. (Patient taking differently: Take 200 mg by mouth daily with supper. ) 180 tablet 3  . tiZANidine (ZANAFLEX) 2 MG tablet Take 1 tablet (2 mg total) by mouth 3 (three) times daily as needed for muscle spasms. (Patient taking differently: Take 2 mg by mouth at bedtime. ) 30 tablet 4  . Tolnaftate POWD Apply 1 application topically daily. For jock itch    . traZODone (DESYREL) 100 MG tablet Take 50 mg by mouth at bedtime. For sleep  1  . warfarin (COUMADIN) 5 MG tablet Take as directed by anticoagulation clinic (Patient taking differently: Take 2.5-5 mg by mouth daily with supper. Take 1 tablet (5 mg) Monday, Wednesday, Friday and Saturday, take 1/2 tablet (2.5 mg) Tuesday, Thursday and Sunday or as directed by anticoagulation clinic) 30 tablet 3  . witch hazel-glycerin (TUCKS) pad Apply 1 application topically as needed for itching, irritation or hemorrhoids.     [name] zolpidem (AMBIEN) 10 MG tablet TAKE 1 TABLET BY MOUTH AT BEDTIME AS NEEDED FOR SLEEP (Patient taking differently: TAKE 1 TABLET BY MOUTH AT BEDTIME) 30 tablet 5  . diltiazem (CARDIZEM CD) 240 MG 24 hr capsule Take 1 capsule (240 mg total) by mouth daily. 30 capsule 1  . nebivolol (BYSTOLIC) 5 MG tablet Take 1 tablet (5 mg total) by mouth daily. 30 tablet 1  . ranolazine (RANEXA) 500 MG 12 hr tablet Take 1 tablet (500 mg total) by mouth 2 (two) times daily. 60 tablet 1  . [DISCONTINUED] atorvastatin (LIPITOR) 20 MG tablet Take 1 tablet (20 mg total) by mouth daily. 30 tablet 11  . [DISCONTINUED] gabapentin (NEURONTIN) 300 MG capsule Take 1 capsule (300  mg total) by mouth 3 (three) times daily. 90 capsule 5   No current facility-administered medications for this encounter.    Allergies  Allergen Reactions  . Pravastatin Sodium Other (See Comments)    myalgia  . Simvastatin Other (See Comments)    severe leg cramps  .  Statins Other (See Comments)    myalgia  . Tape Other (See Comments)    Skin Tears Use Paper Tape Only    History   Social History  . Marital Status: Divorced    Spouse Name: N/A  . Number of Children: 2  . Years of Education: N/A   Occupational History  . retired/disabled. prev worked in Therapist, sports.    Social History Main Topics  . Smoking status: Former Smoker -- 2.00 packs/day for 30 years    Types: Cigarettes    Quit date: 10/16/2007  . Smokeless tobacco: Never Used  . Alcohol Use: No  . Drug Use: No  . Sexual Activity: Not on file   Other Topics Concern  . Not on file   Social History Narrative   Lives alone.    Family History  Problem Relation Age of Onset  . COPD Mother   . Asthma Mother   . Colon polyps Mother   . Allergies Mother   . Asthma Maternal Grandmother   . Colon cancer Neg Hx     ROS- All systems are reviewed and negative except as per the HPI above  Physical Exam: Filed Vitals:   03/23/15 1418  BP: 160/98  Pulse: 106  Height: 5\' 10"  (1.778 m)  Weight: 329 lb 6.4 oz (149.415 kg)    GEN- The patient is not in distress at rest but is symtpomatic with walking, alert and oriented x 3 today.   Head- normocephalic, atraumatic Eyes-  Sclera clear, conjunctiva pink Ears- hearing intact Oropharynx- clear Neck- supple, no JVP Lymph- no cervical lymphadenopathy Lungs- Clear to ausculation bilaterally, normal work of breathing Heart- rapid irregular rate and rhythm, no murmurs, rubs or gallops, PMI not laterally displaced GI- soft, NT, ND, + BS Extremities- no clubbing, cyanosis, or edema MS- no significant deformity or atrophy Skin- no rash or lesion Psych- euthymic mood, full affect Neuro- strength and sensation are intact  EKG- Afib RVR,with grouped beats, RBBB , QTc 510 ms( reviewed with Dr. [name]) Labs reviewed  TEE-03/15/15-.Left ventricle: The estimated ejection fraction was in the range of 50% to 55%. No evidence of  thrombus. - Mitral valve: There was mild to moderate regurgitation. - Left atrium: No evidence of thrombus in the atrial cavity or appendage. No evidence of thrombus in the atrial cavity or appendage. - Tricuspid valve: There was mild-moderate regurgitation.  Impressions:  - Successful cardioversion. No cardiac source of emboli was indentified. Epic records reviewed  Assessment and Plan: 1.Persistent afib with rvr  Discussed with Dr [name] with the following plan: Stop Digoxn with recent normal EF Stop metoprolol due to possible aggravation of lung disease In its place start Bystolic 5 mg daily Reduce lasix back to normal dose Cardizem 240 mg for additional rate control Ranolazine 500 mg bid Recheck here in one week at which time repeat DCCV will be set up Will need procedure labs including INR.  2. Amiodarone induced thyroid toxicosis Reschedule appointment with endocrinologist for further evaluation and treatment Contributing to afib issues Continue BB   3. HTN Poorly managed today Additional meds will help Avoid salt and no excessive fluid intake  4.CHF Return to usual diuretic  dose, pt did not take diuretics as prescribed before doubling and feels like doubling has not helped symptoms and mouth has been dry.  5. COPD/Asthama Possibly exacerbated by metoprolol Change to bystolic  Recheck in one week at which tme will set up for DCCV F/u with Dr. [name] after DCCV, if the following is not successful will have to consider af ablation vrs AV node ablaiton

## 2015-03-30 ENCOUNTER — Telehealth (HOSPITAL_COMMUNITY): Payer: Self-pay | Admitting: Nurse Practitioner

## 2015-03-30 ENCOUNTER — Emergency Department (HOSPITAL_COMMUNITY): Payer: Commercial Managed Care - HMO

## 2015-03-30 ENCOUNTER — Inpatient Hospital Stay (HOSPITAL_COMMUNITY)
Admission: EM | Admit: 2015-03-30 | Discharge: 2015-04-06 | DRG: 308 | Disposition: A | Discharge status: Home / Self Care | Payer: Commercial Managed Care - HMO | Attending: Cardiology | Admitting: Cardiology

## 2015-03-30 ENCOUNTER — Other Ambulatory Visit: Payer: Self-pay

## 2015-03-30 ENCOUNTER — Encounter (HOSPITAL_COMMUNITY): Payer: Self-pay | Admitting: Emergency Medicine

## 2015-03-30 ENCOUNTER — Inpatient Hospital Stay (HOSPITAL_COMMUNITY)
Admission: RE | Admit: 2015-03-30 | Payer: Commercial Managed Care - HMO | Source: Ambulatory Visit | Admitting: Nurse Practitioner

## 2015-03-30 DIAGNOSIS — D509 Iron deficiency anemia, unspecified: Secondary | ICD-10-CM | POA: Diagnosis present

## 2015-03-30 DIAGNOSIS — F329 Major depressive disorder, single episode, unspecified: Secondary | ICD-10-CM | POA: Diagnosis present

## 2015-03-30 DIAGNOSIS — J449 Chronic obstructive pulmonary disease, unspecified: Secondary | ICD-10-CM | POA: Diagnosis present

## 2015-03-30 DIAGNOSIS — G4700 Insomnia, unspecified: Secondary | ICD-10-CM | POA: Diagnosis present

## 2015-03-30 DIAGNOSIS — I951 Orthostatic hypotension: Secondary | ICD-10-CM | POA: Diagnosis present

## 2015-03-30 DIAGNOSIS — I10 Essential (primary) hypertension: Secondary | ICD-10-CM | POA: Diagnosis present

## 2015-03-30 DIAGNOSIS — R079 Chest pain, unspecified: Secondary | ICD-10-CM

## 2015-03-30 DIAGNOSIS — I4819 Other persistent atrial fibrillation: Secondary | ICD-10-CM | POA: Diagnosis present

## 2015-03-30 DIAGNOSIS — I2510 Atherosclerotic heart disease of native coronary artery without angina pectoris: Secondary | ICD-10-CM | POA: Diagnosis present

## 2015-03-30 DIAGNOSIS — I5033 Acute on chronic diastolic (congestive) heart failure: Secondary | ICD-10-CM | POA: Diagnosis not present

## 2015-03-30 DIAGNOSIS — I509 Heart failure, unspecified: Secondary | ICD-10-CM | POA: Diagnosis not present

## 2015-03-30 DIAGNOSIS — I4510 Unspecified right bundle-branch block: Secondary | ICD-10-CM | POA: Diagnosis present

## 2015-03-30 DIAGNOSIS — I452 Bifascicular block: Secondary | ICD-10-CM | POA: Diagnosis not present

## 2015-03-30 DIAGNOSIS — Z87891 Personal history of nicotine dependence: Secondary | ICD-10-CM

## 2015-03-30 DIAGNOSIS — Z9981 Dependence on supplemental oxygen: Secondary | ICD-10-CM | POA: Diagnosis not present

## 2015-03-30 DIAGNOSIS — Z791 Long term (current) use of non-steroidal anti-inflammatories (NSAID): Secondary | ICD-10-CM

## 2015-03-30 DIAGNOSIS — I481 Persistent atrial fibrillation: Principal | ICD-10-CM | POA: Diagnosis present

## 2015-03-30 DIAGNOSIS — E0590 Thyrotoxicosis, unspecified without thyrotoxic crisis or storm: Secondary | ICD-10-CM | POA: Diagnosis present

## 2015-03-30 DIAGNOSIS — Z6841 Body Mass Index (BMI) 40.0 and over, adult: Secondary | ICD-10-CM | POA: Diagnosis not present

## 2015-03-30 DIAGNOSIS — K219 Gastro-esophageal reflux disease without esophagitis: Secondary | ICD-10-CM | POA: Diagnosis present

## 2015-03-30 DIAGNOSIS — E871 Hypo-osmolality and hyponatremia: Secondary | ICD-10-CM | POA: Diagnosis present

## 2015-03-30 DIAGNOSIS — I43 Cardiomyopathy in diseases classified elsewhere: Secondary | ICD-10-CM | POA: Diagnosis not present

## 2015-03-30 DIAGNOSIS — Z79899 Other long term (current) drug therapy: Secondary | ICD-10-CM | POA: Diagnosis not present

## 2015-03-30 DIAGNOSIS — I252 Old myocardial infarction: Secondary | ICD-10-CM

## 2015-03-30 DIAGNOSIS — Z888 Allergy status to other drugs, medicaments and biological substances status: Secondary | ICD-10-CM

## 2015-03-30 DIAGNOSIS — E875 Hyperkalemia: Secondary | ICD-10-CM | POA: Diagnosis present

## 2015-03-30 DIAGNOSIS — Z91048 Other nonmedicinal substance allergy status: Secondary | ICD-10-CM | POA: Diagnosis not present

## 2015-03-30 DIAGNOSIS — M25511 Pain in right shoulder: Secondary | ICD-10-CM | POA: Diagnosis not present

## 2015-03-30 DIAGNOSIS — I495 Sick sinus syndrome: Secondary | ICD-10-CM | POA: Diagnosis not present

## 2015-03-30 DIAGNOSIS — M797 Fibromyalgia: Secondary | ICD-10-CM | POA: Diagnosis present

## 2015-03-30 DIAGNOSIS — J45909 Unspecified asthma, uncomplicated: Secondary | ICD-10-CM | POA: Diagnosis present

## 2015-03-30 DIAGNOSIS — R946 Abnormal results of thyroid function studies: Secondary | ICD-10-CM | POA: Diagnosis present

## 2015-03-30 DIAGNOSIS — F419 Anxiety disorder, unspecified: Secondary | ICD-10-CM | POA: Diagnosis present

## 2015-03-30 DIAGNOSIS — D649 Anemia, unspecified: Secondary | ICD-10-CM | POA: Diagnosis not present

## 2015-03-30 DIAGNOSIS — I5032 Chronic diastolic (congestive) heart failure: Secondary | ICD-10-CM | POA: Diagnosis not present

## 2015-03-30 DIAGNOSIS — E119 Type 2 diabetes mellitus without complications: Secondary | ICD-10-CM | POA: Diagnosis present

## 2015-03-30 DIAGNOSIS — I472 Ventricular tachycardia: Secondary | ICD-10-CM | POA: Diagnosis not present

## 2015-03-30 DIAGNOSIS — G4733 Obstructive sleep apnea (adult) (pediatric): Secondary | ICD-10-CM | POA: Diagnosis present

## 2015-03-30 DIAGNOSIS — R7989 Other specified abnormal findings of blood chemistry: Secondary | ICD-10-CM | POA: Diagnosis not present

## 2015-03-30 DIAGNOSIS — R001 Bradycardia, unspecified: Secondary | ICD-10-CM | POA: Diagnosis not present

## 2015-03-30 DIAGNOSIS — Z7901 Long term (current) use of anticoagulants: Secondary | ICD-10-CM

## 2015-03-30 DIAGNOSIS — R55 Syncope and collapse: Secondary | ICD-10-CM | POA: Diagnosis present

## 2015-03-30 DIAGNOSIS — G894 Chronic pain syndrome: Secondary | ICD-10-CM

## 2015-03-30 DIAGNOSIS — R52 Pain, unspecified: Secondary | ICD-10-CM

## 2015-03-30 DIAGNOSIS — S4991XA Unspecified injury of right shoulder and upper arm, initial encounter: Secondary | ICD-10-CM | POA: Diagnosis not present

## 2015-03-30 DIAGNOSIS — I4891 Unspecified atrial fibrillation: Secondary | ICD-10-CM | POA: Diagnosis not present

## 2015-03-30 LAB — CBG MONITORING, ED: GLUCOSE-CAPILLARY: 91 mg/dL (ref 65–99)

## 2015-03-30 LAB — I-STAT TROPONIN, ED: Troponin i, poc: 0 ng/mL (ref 0.00–0.08)

## 2015-03-30 LAB — CBC
HCT: 26.5 % — ABNORMAL LOW (ref 39.0–52.0)
HEMOGLOBIN: 7.7 g/dL — AB (ref 13.0–17.0)
MCH: 20.3 pg — ABNORMAL LOW (ref 26.0–34.0)
MCHC: 29.1 g/dL — ABNORMAL LOW (ref 30.0–36.0)
MCV: 69.9 fL — ABNORMAL LOW (ref 78.0–100.0)
PLATELETS: 344 10*3/uL (ref 150–400)
RBC: 3.79 MIL/uL — ABNORMAL LOW (ref 4.22–5.81)
RDW: 21.8 % — AB (ref 11.5–15.5)
WBC: 8.3 10*3/uL (ref 4.0–10.5)

## 2015-03-30 LAB — GLUCOSE, CAPILLARY: Glucose-Capillary: 96 mg/dL (ref 65–99)

## 2015-03-30 LAB — MRSA PCR SCREENING: MRSA by PCR: NEGATIVE

## 2015-03-30 LAB — BRAIN NATRIURETIC PEPTIDE: B Natriuretic Peptide: 506.3 pg/mL — ABNORMAL HIGH (ref 0.0–100.0)

## 2015-03-30 LAB — BASIC METABOLIC PANEL
Anion gap: 8 (ref 5–15)
BUN: 14 mg/dL (ref 6–20)
CALCIUM: 8.8 mg/dL — AB (ref 8.9–10.3)
CO2: 24 mmol/L (ref 22–32)
Chloride: 106 mmol/L (ref 101–111)
Creatinine, Ser: 0.71 mg/dL (ref 0.61–1.24)
GFR calc Af Amer: >60 mL/min (ref >60)
GFR calc non Af Amer: >60 mL/min (ref >60)
GLUCOSE: 107 mg/dL — AB (ref 65–99)
Potassium: 4.3 mmol/L (ref 3.5–5.1)
Sodium: 138 mmol/L (ref 135–145)

## 2015-03-30 LAB — I-STAT CHEM 8, ED
BUN: 16 mg/dL (ref 6–20)
Calcium, Ion: 1.2 mmol/L (ref 1.13–1.30)
Chloride: 103 mmol/L (ref 101–111)
Creatinine, Ser: 0.8 mg/dL (ref 0.61–1.24)
Glucose, Bld: 101 mg/dL — ABNORMAL HIGH (ref 65–99)
HEMATOCRIT: 28 % — AB (ref 39.0–52.0)
HEMOGLOBIN: 9.5 g/dL — AB (ref 13.0–17.0)
POTASSIUM: 4.3 mmol/L (ref 3.5–5.1)
SODIUM: 138 mmol/L (ref 135–145)
TCO2: 23 mmol/L (ref 0–100)

## 2015-03-30 LAB — PROTIME-INR
INR: 3.69 — AB (ref 0.00–1.49)
Prothrombin Time: 35.7 seconds — ABNORMAL HIGH (ref 11.6–15.2)

## 2015-03-30 LAB — TROPONIN I: Troponin I: <0.03 ng/mL (ref <0.031)

## 2015-03-30 MED ORDER — PANTOPRAZOLE SODIUM 40 MG PO TBEC
40.0000 mg | DELAYED_RELEASE_TABLET | Freq: Every day | ORAL | Status: DC
  Administered 2015-03-30 – 2015-04-05 (×7): 40 mg via ORAL
  Filled 2015-03-30 (×7): qty 1

## 2015-03-30 MED ORDER — POTASSIUM CHLORIDE CRYS ER 20 MEQ PO TBCR
40.0000 meq | EXTENDED_RELEASE_TABLET | Freq: Two times a day (BID) | ORAL | Status: DC
  Administered 2015-03-31 – 2015-04-04 (×9): 40 meq via ORAL
  Filled 2015-03-30 (×9): qty 2

## 2015-03-30 MED ORDER — SODIUM CHLORIDE 0.9 % IV SOLN: 250.0000 mL | INTRAVENOUS | Status: DC | PRN

## 2015-03-30 MED ORDER — ONDANSETRON HCL 4 MG/2ML IJ SOLN
4.0000 mg | Freq: Four times a day (QID) | INTRAMUSCULAR | Status: DC | PRN
  Administered 2015-04-01 – 2015-04-05 (×2): 4 mg via INTRAVENOUS
  Filled 2015-03-30 (×2): qty 2

## 2015-03-30 MED ORDER — LORATADINE 10 MG PO TABS: 10.0000 mg | ORAL_TABLET | Freq: Every day | ORAL | Status: DC | PRN

## 2015-03-30 MED ORDER — SODIUM CHLORIDE 0.9 % IJ SOLN
3.0000 mL | INTRAMUSCULAR | Status: DC | PRN
  Administered 2015-04-04: 3 mL via INTRAVENOUS
  Filled 2015-03-30: qty 3

## 2015-03-30 MED ORDER — ACETAMINOPHEN 325 MG PO TABS
650.0000 mg | ORAL_TABLET | ORAL | Status: DC | PRN
  Administered 2015-03-31 – 2015-04-05 (×3): 650 mg via ORAL
  Filled 2015-03-30 (×3): qty 2

## 2015-03-30 MED ORDER — TOLNAFTATE POWD: 1.0000 "application " | Freq: Every day | Status: DC

## 2015-03-30 MED ORDER — ALBUTEROL SULFATE (2.5 MG/3ML) 0.083% IN NEBU
2.5000 mg | INHALATION_SOLUTION | Freq: Four times a day (QID) | RESPIRATORY_TRACT | Status: DC
  Administered 2015-03-31 – 2015-04-04 (×16): 2.5 mg via RESPIRATORY_TRACT
  Filled 2015-03-30 (×17): qty 3

## 2015-03-30 MED ORDER — POLYETHYLENE GLYCOL 3350 17 GM/SCOOP PO POWD
17.0000 g | Freq: Two times a day (BID) | ORAL | Status: DC | PRN
  Filled 2015-03-30: qty 255

## 2015-03-30 MED ORDER — LINACLOTIDE 145 MCG PO CAPS: 145.0000 ug | ORAL_CAPSULE | Freq: Every day | ORAL | Status: DC | PRN

## 2015-03-30 MED ORDER — HYDROXYZINE HCL 25 MG PO TABS: 25.0000 mg | ORAL_TABLET | Freq: Three times a day (TID) | ORAL | Status: DC | PRN

## 2015-03-30 MED ORDER — AMIODARONE HCL 200 MG PO TABS
200.0000 mg | ORAL_TABLET | Freq: Every day | ORAL | Status: DC
  Administered 2015-03-30 – 2015-03-31 (×2): 200 mg via ORAL
  Filled 2015-03-30 (×2): qty 1

## 2015-03-30 MED ORDER — NEBIVOLOL HCL 5 MG PO TABS
5.0000 mg | ORAL_TABLET | Freq: Every day | ORAL | Status: DC
  Administered 2015-03-31: 5 mg via ORAL
  Filled 2015-03-30: qty 1

## 2015-03-30 MED ORDER — RANOLAZINE ER 500 MG PO TB12
500.0000 mg | ORAL_TABLET | Freq: Two times a day (BID) | ORAL | Status: DC
  Administered 2015-03-30 – 2015-04-05 (×13): 500 mg via ORAL
  Filled 2015-03-30 (×13): qty 1

## 2015-03-30 MED ORDER — WITCH HAZEL-GLYCERIN EX PADS
1.0000 "application " | MEDICATED_PAD | CUTANEOUS | Status: DC | PRN
  Filled 2015-03-30: qty 100

## 2015-03-30 MED ORDER — DILTIAZEM HCL ER COATED BEADS 240 MG PO CP24
240.0000 mg | ORAL_CAPSULE | Freq: Every day | ORAL | Status: DC
  Filled 2015-03-30: qty 1

## 2015-03-30 MED ORDER — MOMETASONE FURO-FORMOTEROL FUM 100-5 MCG/ACT IN AERO
2.0000 | INHALATION_SPRAY | Freq: Two times a day (BID) | RESPIRATORY_TRACT | Status: DC
  Administered 2015-03-31 – 2015-04-06 (×12): 2 via RESPIRATORY_TRACT
  Filled 2015-03-30: qty 8.8

## 2015-03-30 MED ORDER — POLYETHYLENE GLYCOL 3350 17 G PO PACK
17.0000 g | PACK | Freq: Two times a day (BID) | ORAL | Status: DC | PRN
  Filled 2015-03-30 (×2): qty 1

## 2015-03-30 MED ORDER — CARISOPRODOL 350 MG PO TABS
350.0000 mg | ORAL_TABLET | Freq: Every day | ORAL | Status: DC
  Administered 2015-03-30 – 2015-04-02 (×4): 350 mg via ORAL
  Filled 2015-03-30 (×4): qty 1

## 2015-03-30 MED ORDER — FUROSEMIDE 10 MG/ML IJ SOLN
80.0000 mg | Freq: Two times a day (BID) | INTRAMUSCULAR | Status: DC
  Administered 2015-03-31 – 2015-04-04 (×9): 80 mg via INTRAVENOUS
  Filled 2015-03-30 (×9): qty 8

## 2015-03-30 MED ORDER — TRAZODONE HCL 50 MG PO TABS
50.0000 mg | ORAL_TABLET | Freq: Every day | ORAL | Status: DC
  Administered 2015-03-30 – 2015-04-05 (×7): 50 mg via ORAL
  Filled 2015-03-30 (×7): qty 1

## 2015-03-30 MED ORDER — DOXEPIN HCL 25 MG PO CAPS
25.0000 mg | ORAL_CAPSULE | Freq: Two times a day (BID) | ORAL | Status: DC
  Administered 2015-03-30 – 2015-04-06 (×14): 25 mg via ORAL
  Filled 2015-03-30 (×17): qty 1

## 2015-03-30 MED ORDER — ALPRAZOLAM 0.5 MG PO TABS
0.5000 mg | ORAL_TABLET | Freq: Every day | ORAL | Status: DC
  Administered 2015-03-30 – 2015-04-05 (×7): 0.5 mg via ORAL
  Filled 2015-03-30 (×7): qty 1

## 2015-03-30 MED ORDER — ZOLPIDEM TARTRATE 5 MG PO TABS
10.0000 mg | ORAL_TABLET | Freq: Every day | ORAL | Status: DC
  Administered 2015-03-30 – 2015-04-05 (×7): 10 mg via ORAL
  Filled 2015-03-30 (×7): qty 2

## 2015-03-30 MED ORDER — SERTRALINE HCL 100 MG PO TABS
200.0000 mg | ORAL_TABLET | Freq: Every day | ORAL | Status: DC
  Administered 2015-03-31 – 2015-04-05 (×6): 200 mg via ORAL
  Filled 2015-03-30 (×6): qty 2

## 2015-03-30 MED ORDER — SODIUM CHLORIDE 0.9 % IJ SOLN
3.0000 mL | Freq: Two times a day (BID) | INTRAMUSCULAR | Status: DC
Start: 2015-03-30 — End: 2015-04-06
  Administered 2015-03-30 – 2015-04-05 (×11): 3 mL via INTRAVENOUS

## 2015-03-30 MED ORDER — FUROSEMIDE 10 MG/ML IJ SOLN
80.0000 mg | Freq: Once | INTRAMUSCULAR | Status: AC
  Administered 2015-03-30: 80 mg via INTRAVENOUS
  Filled 2015-03-30: qty 8

## 2015-03-30 MED ORDER — B COMPLEX-C PO TABS
1.0000 | ORAL_TABLET | Freq: Every day | ORAL | Status: DC
  Administered 2015-03-31 – 2015-04-06 (×7): 1 via ORAL
  Filled 2015-03-30 (×7): qty 1

## 2015-03-30 MED ORDER — IBUPROFEN 800 MG PO TABS
800.0000 mg | ORAL_TABLET | Freq: Once | ORAL | Status: AC
  Administered 2015-03-30: 800 mg via ORAL
  Filled 2015-03-30: qty 1

## 2015-03-30 MED ORDER — FERROUS SULFATE 325 (65 FE) MG PO TABS
325.0000 mg | ORAL_TABLET | Freq: Every day | ORAL | Status: DC
  Administered 2015-03-31 – 2015-04-06 (×7): 325 mg via ORAL
  Filled 2015-03-30 (×7): qty 1

## 2015-03-30 MED ORDER — INSULIN ASPART 100 UNIT/ML ~~LOC~~ SOLN
0.0000 [IU] | Freq: Three times a day (TID) | SUBCUTANEOUS | Status: DC
  Administered 2015-04-01: 2 [IU] via SUBCUTANEOUS

## 2015-03-30 MED ORDER — TIZANIDINE HCL 2 MG PO TABS
2.0000 mg | ORAL_TABLET | Freq: Every day | ORAL | Status: DC
  Administered 2015-03-30 – 2015-04-05 (×7): 2 mg via ORAL
  Filled 2015-03-30 (×8): qty 1

## 2015-03-30 MED ORDER — IBUPROFEN 200 MG PO TABS
400.0000 mg | ORAL_TABLET | Freq: Every evening | ORAL | Status: DC | PRN
  Administered 2015-03-31 – 2015-04-05 (×6): 400 mg via ORAL
  Filled 2015-03-30 (×6): qty 2

## 2015-03-30 NOTE — ED Notes (Signed)
Was walking out of trailer down steps and saw stars before collapsing.  Thinks he lost consciousness.  Does not remember the fall.  C/o pain right shoulder.  Alert and oriented,VSS

## 2015-03-30 NOTE — ED Provider Notes (Signed)
CSN: [id number]     Arrival date & time 03/30/15  1410 History   First MD Initiated Contact with Patient 03/30/15 1457     Chief Complaint  Patient presents with  . Loss of Consciousness     (Consider location/radiation/quality/duration/timing/severity/associated sxs/prior Treatment) Patient is a 59 y.o. male presenting with syncope. The history is provided by the patient. No language interpreter was used.  Loss of Consciousness Episode history:  Single Most recent episode:  Today Timing:  Rare Progression:  Unable to specify Chronicity:  New Context comment:  Walkiing down stairs Witnessed: no   Relieved by:  Lying down Associated symptoms: chest pain, difficulty breathing, malaise/fatigue and shortness of breath   Associated symptoms: no confusion, no dizziness, no fever, no headaches, no nausea, no vomiting and no weakness   Chest pain:    Quality:  Pressure   Severity:  Moderate   Duration:  3 weeks   Timing:  Intermittent   Progression:  Waxing and waning   Chronicity:  New Shortness of breath:    Severity:  Moderate   Duration:  3 weeks   Timing:  Intermittent   Progression:  Waxing and waning   Past Medical History  Diagnosis Date  . Chronic pain syndrome   . DYSKINESIA, ESOPHAGUS   . FIBROMYALGIA   . HYPERLIPIDEMIA     but unable to take any meds  . INSOMNIA     takes Ambien nightly  . LOW BACK PAIN     DDD and Herniated disc  . ORCHITIS   . Rash and other nonspecific skin eruption 04/12/2009    no cause found saw dermatologists x 2 and allergist  . Atrial fibrillation     takes Coumadin daily along with Pacerone  . Cardiomyopathy     Myoview with prior infarct- 2012; EF 25% TEE July 2013  . H/O hiatal hernia   . Myocardial infarction     hx of MI  . ANXIETY     takes Xanax nightly  . Muscle spasm     takes Zanaflex daily as needed  . ASTHMA     Albuterol prn and Advair daily;also takes Prednisone daily  . GERD     takes Omeprazole daily  .  DIABETES MELLITUS, TYPE II     takes Metformin daily  . HYPERTENSION     takes Lisinopril daily  . Peripheral edema     takes Lasix daily  . CHF (congestive heart failure)     takes Furosemide daily  . COPD (chronic obstructive pulmonary disease)   . O2 dependent     uses oxygen 2 and 1/2 liters per [place] at hs  . Shortness of breath     lying,sitting,exertion  . SLEEP APNEA, OBSTRUCTIVE     doesn't use CPAP  . Peripheral neuropathy   . Fibromyalgia   . Arthritis   . Joint pain   . Joint swelling   . Chronic constipation     takes OTC stool softener  . History of colon polyps   . Diverticulitis   . Urinary urgency   . Nocturia   . History of kidney stones   . Glaucoma   . DEPRESSION     takes Zoloft and Doxepin daily  . Anemia     supposed to be taking Vit B but doesn't   Past Surgical History  Procedure Laterality Date  . Tonsillectomy    . Acne cyst removal      2 on back   .  Cardioversion  04/18/2012    Procedure: CARDIOVERSION;  Surgeon: Fay Records, MD;  Location: [place];  Service: Cardiovascular;  Laterality: N/A;  . Tee without cardioversion  04/25/2012    Procedure: TRANSESOPHAGEAL ECHOCARDIOGRAM (TEE);  Surgeon: Thayer Headings, MD;  Location: [place];  Service: Cardiovascular;  Laterality: N/A;  . Cardioversion  04/25/2012    Procedure: CARDIOVERSION;  Surgeon: Thayer Headings, MD;  Location: [facility] ENDOSCOPY;  Service: Cardiovascular;  Laterality: N/A;  . Cardioversion  04/25/2012    Procedure: CARDIOVERSION;  Surgeon: Fay Records, MD;  Location: [place];  Service: Cardiovascular;  Laterality: N/A;  . Cardioversion  05/09/2012    Procedure: CARDIOVERSION;  Surgeon: Sherren Mocha, MD;  Location: [place];  Service: Cardiovascular;  Laterality: N/A;  changed from crenshaw to cooper by trish/leone-endo  . Colonoscopy    . Esophagogastroduodenoscopy    . Total knee arthroplasty Right 06/15/2014    Procedure: TOTAL KNEE ARTHROPLASTY;  Surgeon: Renette Butters, MD;   Location: [place];  Service: Orthopedics;  Laterality: Right;  . Colonoscopy with propofol N/A 10/21/2014    Procedure: COLONOSCOPY WITH PROPOFOL;  Surgeon: Ladene Artist, MD;  Location: [facility] ENDOSCOPY;  Service: Endoscopy;  Laterality: N/A;  . Tee without cardioversion N/A 03/15/2015    Procedure: TRANSESOPHAGEAL ECHOCARDIOGRAM (TEE);  Surgeon: Thayer Headings, MD;  Location: [place];  Service: Cardiovascular;  Laterality: N/A;  . Cardioversion N/A 03/15/2015    Procedure: CARDIOVERSION;  Surgeon: Thayer Headings, MD;  Location: [facility] ENDOSCOPY;  Service: Cardiovascular;  Laterality: N/A;   Family History  Problem Relation Age of Onset  . COPD Mother   . Asthma Mother   . Colon polyps Mother   . Allergies Mother   . Asthma Maternal Grandmother   . Colon cancer Neg Hx    History  Substance Use Topics  . Smoking status: Former Smoker -- 2.00 packs/day for 30 years    Types: Cigarettes    Quit date: 10/16/2007  . Smokeless tobacco: Never Used  . Alcohol Use: No    Review of Systems  Constitutional: Positive for malaise/fatigue. Negative for fever, activity change, appetite change and fatigue.  HENT: Negative for congestion, facial swelling, rhinorrhea and trouble swallowing.   Eyes: Negative for photophobia and pain.  Respiratory: Positive for shortness of breath. Negative for cough and chest tightness.   Cardiovascular: Positive for chest pain, leg swelling and syncope.  Gastrointestinal: Negative for nausea, vomiting, abdominal pain, diarrhea and constipation.  Endocrine: Negative for polydipsia and polyuria.  Genitourinary: Negative for dysuria, urgency, decreased urine volume and difficulty urinating.  Musculoskeletal: Negative for back pain and gait problem.  Skin: Negative for color change, rash and wound.  Allergic/Immunologic: Negative for immunocompromised state.  Neurological: Negative for dizziness, facial asymmetry, speech difficulty, weakness, numbness and headaches.    Psychiatric/Behavioral: Negative for confusion, decreased concentration and agitation.      Allergies  Pravastatin sodium; Simvastatin; Statins; and Tape  Home Medications   Prior to Admission medications   Medication Sig Start Date End Date Taking? Authorizing Provider  albuterol (PROVENTIL HFA;VENTOLIN HFA) 108 (90 BASE) MCG/ACT inhaler Inhale 2 puffs into the lungs 4 (four) times daily.   Yes Historical Provider, MD  ALPRAZolam (XANAX) 0.5 MG tablet TAKE 1 TABLET BY MOUTH TWICE A DAY AS NEEDED FOR ANXIETY Patient taking differently: Take 0.5 mg by mouth at bedtime.  02/03/15  Yes [name], MD  amiodarone (PACERONE) 200 MG tablet Take 1 tablet (200 mg total) by  mouth daily. 03/09/15  Yes Deboraha Sprang, MD  b complex vitamins tablet Take 1 tablet by mouth daily.   Yes Historical Provider, MD  carisoprodol (SOMA) 350 MG tablet Take 350 mg by mouth at bedtime.    Yes Historical Provider, MD  clobetasol cream (TEMOVATE) 2.29 % Apply 1 application topically daily as needed (itching).    Yes Historical Provider, MD  diltiazem (CARDIZEM CD) 240 MG 24 hr capsule Take 1 capsule (240 mg total) by mouth daily. 03/23/15  Yes Sherran Needs, NP  doxepin (SINEQUAN) 25 MG capsule Take 25 mg by mouth 2 (two) times daily.   Yes Historical Provider, MD  ferrous sulfate 325 (65 FE) MG tablet Take 325 mg by mouth daily.   Yes Historical Provider, MD  Fluticasone-Salmeterol (ADVAIR) 250-50 MCG/DOSE AEPB Inhale 2 puffs into the lungs 2 (two) times daily.    Yes Historical Provider, MD  furosemide (LASIX) 80 MG tablet Take 1 tablet (80 mg total) by mouth 2 (two) times daily. 03/09/15  Yes Deboraha Sprang, MD  hydrocortisone 2.5 % cream Apply topically 2 (two) times daily. Patient taking differently: Apply 1 application topically 2 (two) times daily.  02/23/15  Yes [name], MD  hydrOXYzine (ATARAX/VISTARIL) 25 MG tablet TAKE 1 TABLET BY MOUTH 3 TIMES A DAY FOR ITCHING Patient taking differently: TAKE 1  TABLET BY MOUTH 3 TIMES A DAY AS NEEDED FOR ITCHING 12/31/14  Yes [name], MD  ibuprofen (ADVIL,MOTRIN) 200 MG tablet Take 400 mg by mouth at bedtime as needed for mild pain (pain).  09/15/14  Yes Historical Provider, MD  Linaclotide Rolan Lipa) 145 MCG CAPS capsule Take 145 mcg by mouth daily as needed (Crampy pain).    Yes Historical Provider, MD  lisinopril (PRINIVIL,ZESTRIL) 5 MG tablet TAKE 1 TABLET (5 MG TOTAL) BY MOUTH DAILY. 06/18/14  Yes Deboraha Sprang, MD  Loratadine 10 MG CAPS Take 10 mg by mouth daily as needed (itching).  09/15/14  Yes Historical Provider, MD  nebivolol (BYSTOLIC) 5 MG tablet Take 1 tablet (5 mg total) by mouth daily. 03/23/15  Yes Sherran Needs, NP  omeprazole (PRILOSEC) 20 MG capsule Take 20 mg by mouth daily with supper.    Yes Historical Provider, MD  polyethylene glycol powder (GLYCOLAX/MIRALAX) powder Take 255 g by mouth daily. Patient taking differently: Take 17 g by mouth 2 (two) times daily as needed (constipation). Mix in 8 oz liquid and drink 01/04/15  Yes [name], MD  ranolazine (RANEXA) 500 MG 12 hr tablet Take 1 tablet (500 mg total) by mouth 2 (two) times daily. 03/23/15  Yes Sherran Needs, NP  sertraline (ZOLOFT) 100 MG tablet Take 2 tablets (200 mg total) by mouth at bedtime. Patient taking differently: Take 200 mg by mouth daily with supper.  01/04/15  Yes [name], MD  tiZANidine (ZANAFLEX) 2 MG tablet Take 1 tablet (2 mg total) by mouth 3 (three) times daily as needed for muscle spasms. Patient taking differently: Take 2 mg by mouth at bedtime.  11/24/14  Yes Meredith Staggers, MD  Tolnaftate POWD Apply 1 application topically daily. For jock itch 07/23/14  Yes Historical Provider, MD  traZODone (DESYREL) 100 MG tablet Take 50 mg by mouth at bedtime. For sleep 02/15/15  Yes Historical Provider, MD  warfarin (COUMADIN) 5 MG tablet Take as directed by anticoagulation clinic Patient taking differently: Take 2.5-5 mg by mouth daily with supper. Take 1  tablet (5 mg) Monday,  Wednesday, Friday and Saturday, take 1/2 tablet (2.5 mg) Tuesday, Thursday and Sunday or as directed by anticoagulation clinic 11/18/14  Yes [name], MD  witch hazel-glycerin (TUCKS) pad Apply 1 application topically as needed for itching, irritation or hemorrhoids.  09/15/14  Yes Historical Provider, MD  zolpidem (AMBIEN) 10 MG tablet TAKE 1 TABLET BY MOUTH AT BEDTIME AS NEEDED FOR SLEEP Patient taking differently: TAKE 1 TABLET BY MOUTH AT BEDTIME 11/19/14  Yes [name], MD  diphenhydrAMINE (BENADRYL) 50 MG tablet Take 1 tablet (50 mg total) by mouth every 6 (six) hours as needed for itching. Patient not taking: Reported on 03/30/2015 12/29/14   [name], MD  hydrocortisone (ANUSOL-HC) 25 MG suppository Place 1 suppository (25 mg total) rectally every 12 (twelve) hours. Patient not taking: Reported on 03/30/2015 08/20/14   [name], MD  metFORMIN (GLUCOPHAGE) 500 MG tablet Take 1,000 mg by mouth daily with breakfast.     Historical Provider, MD  PROAIR HFA 108 (90 BASE) MCG/ACT inhaler INHALE 2 PUFFS INTO THE LUNGS 4 TIMES A DAY Patient not taking: Reported on 03/30/2015 02/23/15   [name], MD   BP 106/56 mmHg  Pulse 93  Temp(Src) 98.3 F (36.8 C) (Oral)  Resp 24  Ht 5\' 10"  (1.778 m)  Wt 322 lb 4.8 oz (146.194 kg)  BMI 46.25 kg/m2  SpO2 97% Physical Exam  Constitutional: He is oriented to person, place, and time. He appears well-developed and well-nourished. No distress.  HENT:  Head: Normocephalic and atraumatic.  Mouth/Throat: No oropharyngeal exudate.  Eyes: Pupils are equal, round, and reactive to light.  Neck: Normal range of motion. Neck supple.  Cardiovascular: Normal rate, regular rhythm and normal heart sounds.  Exam reveals no gallop and no friction rub.   No murmur heard. Pulmonary/Chest: Effort normal. No respiratory distress. He has no wheezes. He has rales in the right lower field and the left lower field.  Abdominal: Soft. Bowel sounds  are normal. He exhibits no distension and no mass. There is no tenderness. There is no rebound and no guarding.  Musculoskeletal: Normal range of motion. He exhibits edema (2+ BLLE). He exhibits no tenderness.       Arms: Neurological: He is alert and oriented to person, place, and time.  Skin: Skin is warm and dry.  Psychiatric: He has a normal mood and affect.    ED Course  Procedures (including critical care time) Labs Review Labs Reviewed  CBC - Abnormal; Notable for the following:    RBC 3.79 (*)    Hemoglobin 7.7 (*)    HCT 26.5 (*)    MCV 69.9 (*)    MCH 20.3 (*)    MCHC 29.1 (*)    RDW 21.8 (*)    All other components within normal limits  BASIC METABOLIC PANEL - Abnormal; Notable for the following:    Glucose, Bld 107 (*)    Calcium 8.8 (*)    All other components within normal limits  BRAIN NATRIURETIC PEPTIDE - Abnormal; Notable for the following:    B Natriuretic Peptide 506.3 (*)    All other components within normal limits  PROTIME-INR - Abnormal; Notable for the following:    Prothrombin Time 35.7 (*)    INR 3.69 (*)    All other components within normal limits  I-STAT CHEM 8, ED - Abnormal; Notable for the following:    Glucose, Bld 101 (*)    Hemoglobin 9.5 (*)  HCT 28.0 (*)    All other components within normal limits  MRSA PCR SCREENING  TROPONIN I  GLUCOSE, CAPILLARY  BASIC METABOLIC PANEL  TROPONIN I  TROPONIN I  CBC  CBG MONITORING, ED  I-STAT TROPOININ, ED    Imaging Review Dg Chest 2 View  03/30/2015   CLINICAL DATA:  Chest pain and right shoulder pain after falling down steps coming out of a trailer today.  EXAM: CHEST  2 VIEW  COMPARISON:  01/26/2014  FINDINGS: There is new cardiomegaly with a new small right pleural effusion. There is slight pulmonary vascular congestion. No infiltrates. No acute osseous abnormality.  IMPRESSION: New cardiomegaly with pulmonary vascular congestion and a new small right pleural effusion. Findings  consistent with mild congestive heart failure.   Electronically Signed   By: Lorriane Shire M.D.   On: 03/30/2015 15:24   Dg Shoulder Right  03/30/2015   CLINICAL DATA:  59 year old male with right shoulder pain after falling down several steps today. Decreased range of motion.  EXAM: RIGHT SHOULDER - 2+ VIEW  COMPARISON:  Concurrently obtained chest x-ray.  FINDINGS: No acute fracture or malalignment. Degenerative osteoarthritis present at the acromioclavicular joint. Clavicle and scapula appear intact. Visualized thorax demonstrates no evidence of acute rib fracture. Normal bony mineralization. There is a small right-sided pleural effusion.  IMPRESSION: 1. No acute fracture or malalignment. 2. AC joint osteoarthritis. 3. Small right pleural effusion.   Electronically Signed   By: Jacqulynn Cadet M.D.   On: 03/30/2015 15:23     EKG Interpretation   Date/Time:  Wednesday March 30 2015 14:08:57 EDT Ventricular Rate:  86 PR Interval:    QRS Duration: 172 QT Interval:  407 QTC Calculation: 487 R Axis:   -103 Text Interpretation:  Atrial fibrillation RBBB and LAFB EKG similar from  prior Confirmed by DOCHERTY  MD, MEGAN ([id number]) on 03/30/2015 3:02:46 PM      MDM   Final diagnoses:  Acute on chronic diastolic heart failure  Persistent atrial fibrillation  Syncope, unspecified syncope type  Anemia, unspecified anemia type    Pt is a 59 y.o. male with Pmhx as above who presents with  Syncopal episode occrring this afternoon after walking down stairs.  He says he "saw stars and then the next thing he knew he woke up underneath of his trailer.  He has been having intermittent chest pain over the past 2 or 3 weeks as well as exertional shortness of breath.  He is scheduled to see Dr. [name] with cardiology today.  On EKG patient in atrial fibrillation which she has a history of in the past requiring several DC cardioversions.  On physical exam, vital signs are stable and he is in no acute distress.   He has mild crackles at his bilateral bases and also has 2+ pitting edema.  He has tenderness over the anterior shoulder.  The shoulder is located.  BNP is somewhat elevated at 506, chest x-ray shows new cardiomegaly with pulmonary vascular congestion and a new small right pleural effusion consistent with mild CHF exacerbation.  First troponin is negative.  Patient also has anemia at 7.7, he states his PCP has told him this.  Recently, although the source of his anemia is unclear at this point. 80 mg IV lasix given. Cardiology consulted, will admit.        Ernestina Patches, MD 03/31/15 [phone]

## 2015-03-30 NOTE — ED Notes (Signed)
Pt began c/o cp during triage, comes and goes.  Pt states it has been going on for awhile.  No associated symptoms, no radiation.

## 2015-03-30 NOTE — Progress Notes (Signed)
Patient admitted to cardiology service for syncope, acute on chronic diastolic HF, CP, persistent a-fib and anemia. Admission orders placed after discussing with Dr. Ellyn Hack. Dr. Allison Quarry H&P to follow  Signed, Almyra Deforest Staples Pager: [phone]

## 2015-03-30 NOTE — H&P (Signed)
CARDIOLOGY ADMISSION HISTORY & PHYSICAL NOTE.  NAME:  [name]   MRN: [id number] DOB:  [birth year]   ADMIT DATE: 03/30/2015  Reason for Admission: syncope, recurrent persistent Afib, acute on chronic diastolic HF  Primary Cardiologist: Dr. [name]  HPI: This is a 59 y.o. male with a past medical history significant for Persistent recurrent atrial fibrillation that dates back to at least 2001. Santella's ischemic evaluations only been Myoview as was suggested inferior infarct but he has not had a heart catheterization. Intermittently during the course of his atrial fibrillation history, he has had significant cardiomyopathy with EF slowly 20-25% and is now been as high as 50-55% by recent TEE.  He is essentially very symptomatic when he goes into atrial fibrillation. 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  He presented to Dr. Lynelle Doctor with persistent afib 5/25 and beta blocker was added, amio was doubled and he was set up for DCCV on May 31.[name] He maintained SR x one day. He then had worsening shortness of breath along with afib poorly rate controlled. His lasix was increased and BB doubled two days ago but is becoming more short of breath with activity.   He is referred to the afib clinic 6/8 for further evaluation. It was noted that pt became significantly dyspneic with ambulation to exam room. His EKG does confirm afib with resting v rate of 106 bpm, PO 91%. Upon walking, he did not desaturate further, but v rates increased up to 180 bpm. He is comfortable at rest and sleeps in a recliner(chronic) and not not had any PND/orthopnea. Increase in diuretics has not helped his symptoms and the pt's daughter reports that he was not taking his fluid pill on a regular basis when it was doubled so therefore will go back to usual dose. He feels like his breathing has worsened since BB  was increased and there is a good chance it could be causing an exacerbation of his lung issues. He had an appointment with endocrinology today to have thyroid evaluated but he cancelled for this appointment. His amiodarone was doubled prior to DCCV but now is back to once daily.He has sleep apnea and can not tolerate cpap but uses O2 at night by cannula.  Amount evaluation he noted palpitations, chest tightness with increased heart rates as well as dyspnea suspicion of exertion with lower extremity edema. The plan from that visit was:  Stop Digoxn with recent normal EF  Stop metoprolol due to possible aggravation of lung disease  In its place start Bystolic 5 mg daily  Reduce lasix back to normal dose  Cardizem 240 mg for additional rate control  Ranolazine 500 mg bid  Recheck here in one week at which time repeat DCCV will be set up  He was asked to schedule to come in to the A. Fib clinic office today, however this appointment was canceled because he basically blacked out when walking after his car. He describes noting a sensation of the lights going out and flashing that all went black and into the ground. He does not recall rapid or irregular heartbeats. He has noted some orthostatic symptoms over last few weeks since adjustment of his medications. With orthopnea and edema since being back in atrial fibrillation.  On presentation to the emergency room he also noted substernal chest pains and pressures as well as periods of rapid heartbeat usually been  the neural control. No stroke-like symptoms. No significant bleeding issues of melena, hematochezia or hematuria.  PMHx:   Past Medical History  Diagnosis Date  . Chronic pain syndrome   . DYSKINESIA, ESOPHAGUS   . FIBROMYALGIA   . HYPERLIPIDEMIA     but unable to take any meds  . INSOMNIA     takes Ambien nightly  . LOW BACK PAIN     DDD and Herniated disc  . ORCHITIS   . Rash and other nonspecific skin eruption 04/12/2009     no cause found saw dermatologists x 2 and allergist  . Atrial fibrillation     takes Coumadin daily along with Pacerone  . Cardiomyopathy     Myoview with prior infarct- 2012; EF 25% TEE July 2013  . H/O hiatal hernia   . Myocardial infarction     hx of MI  . ANXIETY     takes Xanax nightly  . Muscle spasm     takes Zanaflex daily as needed  . ASTHMA     Albuterol prn and Advair daily;also takes Prednisone daily  . GERD     takes Omeprazole daily  . DIABETES MELLITUS, TYPE II     takes Metformin daily  . HYPERTENSION     takes Lisinopril daily  . Peripheral edema     takes Lasix daily  . CHF (congestive heart failure)     takes Furosemide daily  . COPD (chronic obstructive pulmonary disease)   . O2 dependent     uses oxygen 2 and 1/2 liters per [place] at hs  . Shortness of breath     lying,sitting,exertion  . SLEEP APNEA, OBSTRUCTIVE     doesn't use CPAP  . Peripheral neuropathy   . Fibromyalgia   . Arthritis   . Joint pain   . Joint swelling   . Chronic constipation     takes OTC stool softener  . History of colon polyps   . Diverticulitis   . Urinary urgency   . Nocturia   . History of kidney stones   . Glaucoma   . DEPRESSION     takes Zoloft and Doxepin daily  . Anemia     supposed to be taking Vit B but doesn't   Past Surgical History  Procedure Laterality Date  . Tonsillectomy    . Acne cyst removal      2 on back   . Cardioversion  04/18/2012    Procedure: CARDIOVERSION;  Surgeon: Fay Records, MD;  Location: [place];  Service: Cardiovascular;  Laterality: N/A;  . Tee without cardioversion  04/25/2012    Procedure: TRANSESOPHAGEAL ECHOCARDIOGRAM (TEE);  Surgeon: Thayer Headings, MD;  Location: [place];  Service: Cardiovascular;  Laterality: N/A;  . Cardioversion  04/25/2012    Procedure: CARDIOVERSION;  Surgeon: Thayer Headings, MD;  Location: [facility] ENDOSCOPY;  Service: Cardiovascular;  Laterality: N/A;  . Cardioversion  04/25/2012    Procedure:  CARDIOVERSION;  Surgeon: Fay Records, MD;  Location: [place];  Service: Cardiovascular;  Laterality: N/A;  . Cardioversion  05/09/2012    Procedure: CARDIOVERSION;  Surgeon: Sherren Mocha, MD;  Location: [place];  Service: Cardiovascular;  Laterality: N/A;  changed from crenshaw to cooper by trish/leone-endo  . Colonoscopy    . Esophagogastroduodenoscopy    . Total knee arthroplasty Right 06/15/2014    Procedure: TOTAL KNEE ARTHROPLASTY;  Surgeon: Renette Butters, MD;  Location: [place];  Service: Orthopedics;  Laterality: Right;  . Colonoscopy with  propofol N/A 10/21/2014    Procedure: COLONOSCOPY WITH PROPOFOL;  Surgeon: Ladene Artist, MD;  Location: [facility] ENDOSCOPY;  Service: Endoscopy;  Laterality: N/A;  . Tee without cardioversion N/A 03/15/2015    Procedure: TRANSESOPHAGEAL ECHOCARDIOGRAM (TEE);  Surgeon: Thayer Headings, MD;  Location: [place];  Service: Cardiovascular;  Laterality: N/A;  . Cardioversion N/A 03/15/2015    Procedure: CARDIOVERSION;  Surgeon: Thayer Headings, MD;  Location: [facility] ENDOSCOPY;  Service: Cardiovascular;  Laterality: N/A;    FAMHx: Family History  Problem Relation Age of Onset  . COPD Mother   . Asthma Mother   . Colon polyps Mother   . Allergies Mother   . Asthma Maternal Grandmother   . Colon cancer Neg Hx     SOCHx:  reports that he quit smoking about 7 years ago. His smoking use included Cigarettes. He has a 60 pack-year smoking history. He has never used smokeless tobacco. He reports that he does not drink alcohol or use illicit drugs.  ALLERGIES: Allergies  Allergen Reactions  . Pravastatin Sodium Other (See Comments)    myalgia  . Simvastatin Other (See Comments)    severe leg cramps  . Statins Other (See Comments)    myalgia  . Tape Other (See Comments)    Skin Tears Use Paper Tape Only   ROS: Review of Systems  Constitutional: Positive for malaise/fatigue. Negative for fever and weight loss (weight gain).  HENT: Negative for nosebleeds.     Respiratory: Positive for shortness of breath and wheezing.   Cardiovascular: Positive for chest pain, palpitations, orthopnea, leg swelling and PND.  Gastrointestinal: Positive for nausea. Negative for blood in stool and melena.  Musculoskeletal: Positive for falls (today with his syncope spell).  Neurological: Positive for dizziness and loss of consciousness. Negative for sensory change, speech change, focal weakness and seizures.  All other systems reviewed and are negative.   HOME MEDICATIONS: Prescriptions prior to admission  Medication Sig Dispense Refill Last Dose  . albuterol (PROVENTIL HFA;VENTOLIN HFA) 108 (90 BASE) MCG/ACT inhaler Inhale 2 puffs into the lungs 4 (four) times daily.   03/30/2015 at Unknown time  . ALPRAZolam (XANAX) 0.5 MG tablet TAKE 1 TABLET BY MOUTH TWICE A DAY AS NEEDED FOR ANXIETY (Patient taking differently: Take 0.5 mg by mouth at bedtime. ) 60 tablet 2 03/29/2015 at Unknown time  . amiodarone (PACERONE) 200 MG tablet Take 1 tablet (200 mg total) by mouth daily. 30 tablet 3 03/29/2015 at Unknown time  . b complex vitamins tablet Take 1 tablet by mouth daily.   Past Week at Unknown time  . carisoprodol (SOMA) 350 MG tablet Take 350 mg by mouth at bedtime.    03/29/2015 at Unknown time  . clobetasol cream (TEMOVATE) 5.00 % Apply 1 application topically daily as needed (itching).    03/29/2015 at Unknown time  . diltiazem (CARDIZEM CD) 240 MG 24 hr capsule Take 1 capsule (240 mg total) by mouth daily. 30 capsule 1 03/30/2015 at Unknown time  . doxepin (SINEQUAN) 25 MG capsule Take 25 mg by mouth 2 (two) times daily.   03/29/2015 at Unknown time  . ferrous sulfate 325 (65 FE) MG tablet Take 325 mg by mouth daily.   Past Week at Unknown time  . Fluticasone-Salmeterol (ADVAIR) 250-50 MCG/DOSE AEPB Inhale 2 puffs into the lungs 2 (two) times daily.    03/30/2015 at Unknown time  . furosemide (LASIX) 80 MG tablet Take 1 tablet (80 mg total) by mouth 2 (two) times  daily. 60  tablet 3 03/30/2015 at Unknown time  . hydrocortisone 2.5 % cream Apply topically 2 (two) times daily. (Patient taking differently: Apply 1 application topically 2 (two) times daily. ) 30 g 5 03/29/2015 at Unknown time  . hydrOXYzine (ATARAX/VISTARIL) 25 MG tablet TAKE 1 TABLET BY MOUTH 3 TIMES A DAY FOR ITCHING (Patient taking differently: TAKE 1 TABLET BY MOUTH 3 TIMES A DAY AS NEEDED FOR ITCHING) 30 tablet 1 03/29/2015 at Unknown time  . ibuprofen (ADVIL,MOTRIN) 200 MG tablet Take 400 mg by mouth at bedtime as needed for mild pain (pain).    Past Week at Unknown time  . Linaclotide (LINZESS) 145 MCG CAPS capsule Take 145 mcg by mouth daily as needed (Crampy pain).    Past Week at Unknown time  . lisinopril (PRINIVIL,ZESTRIL) 5 MG tablet TAKE 1 TABLET (5 MG TOTAL) BY MOUTH DAILY. 30 tablet 3 03/29/2015 at Unknown time  . Loratadine 10 MG CAPS Take 10 mg by mouth daily as needed (itching).    Past Week at Unknown time  . nebivolol (BYSTOLIC) 5 MG tablet Take 1 tablet (5 mg total) by mouth daily. 30 tablet 1 03/30/2015 at 1000  . omeprazole (PRILOSEC) 20 MG capsule Take 20 mg by mouth daily with supper.    03/29/2015 at Unknown time  . polyethylene glycol powder (GLYCOLAX/MIRALAX) powder Take 255 g by mouth daily. (Patient taking differently: Take 17 g by mouth 2 (two) times daily as needed (constipation). Mix in 8 oz liquid and drink) 255 g 3 Past Week at Unknown time  . ranolazine (RANEXA) 500 MG 12 hr tablet Take 1 tablet (500 mg total) by mouth 2 (two) times daily. 60 tablet 1 03/30/2015 at Unknown time  . sertraline (ZOLOFT) 100 MG tablet Take 2 tablets (200 mg total) by mouth at bedtime. (Patient taking differently: Take 200 mg by mouth daily with supper. ) 180 tablet 3 03/29/2015 at Unknown time  . tiZANidine (ZANAFLEX) 2 MG tablet Take 1 tablet (2 mg total) by mouth 3 (three) times daily as needed for muscle spasms. (Patient taking differently: Take 2 mg by mouth at bedtime. ) 30 tablet 4 03/29/2015 at  Unknown time  . Tolnaftate POWD Apply 1 application topically daily. For jock itch   unkn  . traZODone (DESYREL) 100 MG tablet Take 50 mg by mouth at bedtime. For sleep  1 03/29/2015 at Unknown time  . warfarin (COUMADIN) 5 MG tablet Take as directed by anticoagulation clinic (Patient taking differently: Take 2.5-5 mg by mouth daily with supper. Take 1 tablet (5 mg) Monday, Wednesday, Friday and Saturday, take 1/2 tablet (2.5 mg) Tuesday, Thursday and Sunday or as directed by anticoagulation clinic) 30 tablet 3 03/29/2015 at Unknown time  . witch hazel-glycerin (TUCKS) pad Apply 1 application topically as needed for itching, irritation or hemorrhoids.    unkn  . zolpidem (AMBIEN) 10 MG tablet TAKE 1 TABLET BY MOUTH AT BEDTIME AS NEEDED FOR SLEEP (Patient taking differently: TAKE 1 TABLET BY MOUTH AT BEDTIME) 30 tablet 5 03/29/2015 at Unknown time  . diphenhydrAMINE (BENADRYL) 50 MG tablet Take 1 tablet (50 mg total) by mouth every 6 (six) hours as needed for itching. (Patient not taking: Reported on 03/30/2015) 30 tablet 2 Not Taking at Unknown time  . hydrocortisone (ANUSOL-HC) 25 MG suppository Place 1 suppository (25 mg total) rectally every 12 (twelve) hours. (Patient not taking: Reported on 03/30/2015) 12 suppository 1 Not Taking at Unknown time  . metFORMIN (GLUCOPHAGE) 500 MG tablet  Take 1,000 mg by mouth daily with breakfast.    Taking  . PROAIR HFA 108 (90 BASE) MCG/ACT inhaler INHALE 2 PUFFS INTO THE LUNGS 4 TIMES A DAY (Patient not taking: Reported on 03/30/2015) 8.5 Inhaler 5 Not Taking at Unknown time    VITALS: Blood pressure 106/56, pulse 93, temperature 98.3 F (36.8 C), temperature source Oral, resp. rate 24, height 5\' 10"  (1.778 m), weight 146.194 kg (322 lb 4.8 oz), SpO2 97 %.  PHYSICAL EXAM: General appearance: alert, cooperative, appears stated age, mild distress, morbidly obese and Clearly not well-appearing, nontoxic Neck: no adenopathy, no carotid bruit, thyroid not enlarged,  symmetric, no tenderness/mass/nodules and JVP is hard to tell due to body habitus but there is HJR. Lungs: Mild basal rales. Upper airway wheezing but otherwise non-labored. Heart: Irregularly irregular rhythm with normal rate. Normal S1 with split S2. No obvious M./R./G. Abdomen: Morbidly obese, soft. Mild tenderness with some distention and tightness Extremities: 2-3+ lower extremity pitting edema with venous stasis changes. There is also some mild sacral edema. Pulses: 2+ and symmetric Skin: Venous stasis changes in lower extremities Neurologic: Mental status: Alert, oriented, thought content appropriate, affect: normal and Anxious Cranial nerves: normal  LABS: Results for orders placed or performed during the hospital encounter of 03/30/15 (from the past 24 hour(s))  CBC     Status: Abnormal   Collection Time: 03/30/15  2:24 PM  Result Value Ref Range   WBC 8.3 4.0 - 10.5 K/uL   RBC 3.79 (L) 4.22 - 5.81 MIL/uL   Hemoglobin 7.7 (L) 13.0 - 17.0 g/dL   HCT 26.5 (L) 39.0 - 52.0 %   MCV 69.9 (L) 78.0 - 100.0 fL   MCH 20.3 (L) 26.0 - 34.0 pg   MCHC 29.1 (L) 30.0 - 36.0 g/dL   RDW 21.8 (H) 11.5 - 15.5 %   Platelets 344 150 - 400 K/uL  Basic metabolic panel     Status: Abnormal   Collection Time: 03/30/15  2:24 PM  Result Value Ref Range   Sodium 138 135 - 145 mmol/L   Potassium 4.3 3.5 - 5.1 mmol/L   Chloride 106 101 - 111 mmol/L   CO2 24 22 - 32 mmol/L   Glucose, Bld 107 (H) 65 - 99 mg/dL   BUN 14 6 - 20 mg/dL   Creatinine, Ser 0.71 0.61 - 1.24 mg/dL   Calcium 8.8 (L) 8.9 - 10.3 mg/dL   GFR calc non Af Amer >60 >60 mL/min   GFR calc Af Amer >60 >60 mL/min   Anion gap 8 5 - 15  BNP (order ONLY if patient complains of dyspnea/SOB AND you have documented it for THIS visit)     Status: Abnormal   Collection Time: 03/30/15  2:24 PM  Result Value Ref Range   B Natriuretic Peptide 506.3 (H) 0.0 - 100.0 pg/mL  Protime-INR (if pt is taking Coumadin)     Status: Abnormal   Collection  Time: 03/30/15  2:24 PM  Result Value Ref Range   Prothrombin Time 35.7 (H) 11.6 - 15.2 seconds   INR 3.69 (H) 0.00 - 1.49  I-stat troponin, ED  (not at [facility], [facility])     Status: None   Collection Time: 03/30/15  2:44 PM  Result Value Ref Range   Troponin i, poc 0.00 0.00 - 0.08 ng/mL   Comment 3          I-Stat Chem 8, ED  (not at [facility], [facility])     Status: Abnormal  Collection Time: 03/30/15  2:53 PM  Result Value Ref Range   Sodium 138 135 - 145 mmol/L   Potassium 4.3 3.5 - 5.1 mmol/L   Chloride 103 101 - 111 mmol/L   BUN 16 6 - 20 mg/dL   Creatinine, Ser 0.80 0.61 - 1.24 mg/dL   Glucose, Bld 101 (H) 65 - 99 mg/dL   Calcium, Ion 1.20 1.13 - 1.30 mmol/L   TCO2 23 0 - 100 mmol/L   Hemoglobin 9.5 (L) 13.0 - 17.0 g/dL   HCT 28.0 (L) 39.0 - 52.0 %  CBG, ED     Status: None   Collection Time: 03/30/15  3:44 PM  Result Value Ref Range   Glucose-Capillary 91 65 - 99 mg/dL   Comment 1 Notify RN    Comment 2 Document in Chart   Glucose, capillary     Status: None   Collection Time: 03/30/15  8:39 PM  Result Value Ref Range   Glucose-Capillary 96 65 - 99 mg/dL    IMAGING: Dg Chest 2 View  03/30/2015   CLINICAL DATA:  Chest pain and right shoulder pain after falling down steps coming out of a trailer today.  EXAM: CHEST  2 VIEW  COMPARISON:  01/26/2014  FINDINGS: There is new cardiomegaly with a new small right pleural effusion. There is slight pulmonary vascular congestion. No infiltrates. No acute osseous abnormality.  IMPRESSION: New cardiomegaly with pulmonary vascular congestion and a new small right pleural effusion. Findings consistent with mild congestive heart failure.   Electronically Signed   By: Lorriane Shire M.D.   On: 03/30/2015 15:24   Dg Shoulder Right  03/30/2015   CLINICAL DATA:  59 year old male with right shoulder pain after falling down several steps today. Decreased range of motion.  EXAM: RIGHT SHOULDER - 2+ VIEW  COMPARISON:  Concurrently obtained chest x-ray.   FINDINGS: No acute fracture or malalignment. Degenerative osteoarthritis present at the acromioclavicular joint. Clavicle and scapula appear intact. Visualized thorax demonstrates no evidence of acute rib fracture. Normal bony mineralization. There is a small right-sided pleural effusion.  IMPRESSION: 1. No acute fracture or malalignment. 2. AC joint osteoarthritis. 3. Small right pleural effusion.   Electronically Signed   By: Jacqulynn Cadet M.D.   On: 03/30/2015 15:23    IMPRESSION: Principal Problem:   Syncope Active Problems:   Anticoagulated on warfarin   Chronic diastolic heart failure   Acute on chronic diastolic heart failure   Persistent atrial fibrillation   Controlled diabetes mellitus type II without complication   Morbid obesity   Essential hypertension   Anemia   Abnormal TSH   Bundle branch block, right and extreme left anterior fascicular    RECOMMENDATION:  This intermittent chest pain, we'll admit him to step down.   He is fully evaluated on Remeron which continue given his recent TEE guided cardioversion He has had a recent echocardiogram therefore we will not need to repeat one. His heart failure is probably acute on chronic diastolic heart failure complicated by his atrial fibrillation. He is really not symptomatic unless in A. Fib.  Continue amiodarone at current dose, deferred the PSA would like to change this on a conservative keratosis previously as well to the Lipitor.  He was on a stalk now along with diltiazem and has somewhat borderline blood pressures. Would hold or reduce the dose of lisinopril for now.  IV diuresis with 80 mg IV twice a day Lasix.  3 syncope, concern for possible orthostatic symptoms and  therefore we'll check orthostatics, the other possibility could be pauses with a sick sinus syndrome/tachybradycardia-type situation. We'll monitor him on telemetry.  He has had a negative Myoview within the year as far as ischemia goes my  suspicion is that his chest discomfort is related to atrial fibrillation and heart failure as opposed to coronary disease. It is also mildly reproducible exam. He is ordered on a beta blocker  Hold metformin for now as lisinopril insulin.  Appears to be anemic on admit labs to recheck labs in the morning and evaluate if necessary. He denies any melena hematochezia or hematuria and no sign of other bleeding. He also does not appear to be pale.  He has maintained a stable INR since his last cardioversion and the plan was to cardiovert him at next week if possible, we may try to advance that plan until perhaps Friday as he is quite submitted while in A. Fib. The trick we'll then need to establish a plan of care to keep it out of A. Fib with something other than amiodarone which has caused thyrotoxicosis.    Time Spent Directly with Patient: 60 minutes  Tasmin Exantus, Leonie Green, M.D., M.S. Interventional Cardiologist   Pager # [phone]

## 2015-03-30 NOTE — Telephone Encounter (Signed)
Daughter called to say that pt got dizzy and fell on the way to her car to come to [facility] appointment and EMS was on the way. He told her today that he fell at home yesterday as well.

## 2015-03-31 ENCOUNTER — Encounter (HOSPITAL_COMMUNITY): Payer: Self-pay | Admitting: Nurse Practitioner

## 2015-03-31 ENCOUNTER — Inpatient Hospital Stay (HOSPITAL_COMMUNITY): Payer: Commercial Managed Care - HMO

## 2015-03-31 LAB — CBC
HEMATOCRIT: 28.7 % — AB (ref 39.0–52.0)
HEMOGLOBIN: 8.2 g/dL — AB (ref 13.0–17.0)
MCH: 19.9 pg — AB (ref 26.0–34.0)
MCHC: 28.6 g/dL — AB (ref 30.0–36.0)
MCV: 69.5 fL — ABNORMAL LOW (ref 78.0–100.0)
Platelets: 371 10*3/uL (ref 150–400)
RBC: 4.13 MIL/uL — ABNORMAL LOW (ref 4.22–5.81)
RDW: 21.6 % — ABNORMAL HIGH (ref 11.5–15.5)
WBC: 7 10*3/uL (ref 4.0–10.5)

## 2015-03-31 LAB — BASIC METABOLIC PANEL
Anion gap: 7 (ref 5–15)
BUN: 14 mg/dL (ref 6–20)
CO2: 27 mmol/L (ref 22–32)
Calcium: 8.5 mg/dL — ABNORMAL LOW (ref 8.9–10.3)
Chloride: 101 mmol/L (ref 101–111)
Creatinine, Ser: 0.81 mg/dL (ref 0.61–1.24)
Glucose, Bld: 109 mg/dL — ABNORMAL HIGH (ref 65–99)
Potassium: 4 mmol/L (ref 3.5–5.1)
SODIUM: 135 mmol/L (ref 135–145)

## 2015-03-31 LAB — GLUCOSE, CAPILLARY
GLUCOSE-CAPILLARY: 107 mg/dL — AB (ref 65–99)
GLUCOSE-CAPILLARY: 105 mg/dL — AB (ref 65–99)
Glucose-Capillary: 96 mg/dL (ref 65–99)
Glucose-Capillary: 116 mg/dL — ABNORMAL HIGH (ref 65–99)

## 2015-03-31 LAB — TROPONIN I
Troponin I: <0.03 ng/mL (ref <0.031)
Troponin I: <0.03 ng/mL (ref <0.031)

## 2015-03-31 LAB — MAGNESIUM: MAGNESIUM: 1.8 mg/dL (ref 1.7–2.4)

## 2015-03-31 MED ORDER — NEBIVOLOL HCL 5 MG PO TABS
10.0000 mg | ORAL_TABLET | Freq: Every day | ORAL | Status: DC
  Administered 2015-04-01 – 2015-04-04 (×4): 10 mg via ORAL
  Filled 2015-03-31 (×4): qty 2

## 2015-03-31 MED ORDER — DILTIAZEM HCL ER COATED BEADS 120 MG PO CP24
120.0000 mg | ORAL_CAPSULE | Freq: Every day | ORAL | Status: DC
  Filled 2015-03-31: qty 1

## 2015-03-31 NOTE — Consult Note (Signed)
ELECTROPHYSIOLOGY CONSULT NOTE    Patient ID: [name] MRN: [id number], DOB/AGE: 1956/05/02 59 y.o.  Admit date: 03/30/2015 Date of Consult: 03/31/2015  Primary Physician: [name], MD Primary Cardiologist: [name]  Reason for Consultation: atrial fibrillation  HPI:  [name] is a 59 y.o. male with a past medical history significant for fibromyalgia, persistent atrial fibrillation, previous tachycardia induced cardiomyopathy (now resolved), diabetes, hypertension, COPD, sleep apnea (intolerant of CPAP), and hyperthyroidism.  He was first diagnosed in atrial fibrillation in 2001 and was placed on amiodarone in 2013. Since that time, he had maintained SR until February of this year when he developed symptoms of palpitations, shortness of breath, and fatigue.  He was seen by Dr [name] and underwent cardioversion but only held SR for 1 day. He was scheduled to be seen in the AF clinic, but on the day of the appointment had 2 episodes of syncope while standing and called EMS for further evaluation.  He was brought to the hospital and was found to be in AF with diastolic heart failure exacerbation and was admitted for further evaluation.   Last echo 05/2013 demonstrated EF 60%, mild LVH, LA 58.  TEE 02/2015 demonstrated EF 50-55%, mild to moderate MR, mild to moderate TR.   He has undergone rest myoview portion today and will undergo stress portion tomorrow to look for ischemia.   He currently states that he is fatigued and has exertional shortness of breath.  He denies orthopnea, PND, recent fevers, chills, nausea or vomiting.   Past Medical History  Diagnosis Date  . DYSKINESIA, ESOPHAGUS   . FIBROMYALGIA   . HYPERLIPIDEMIA     but unable to take any meds  . INSOMNIA     takes Ambien nightly  . Rash and other nonspecific skin eruption 04/12/2009    no cause found saw dermatologists x 2 and allergist  . Atrial fibrillation     takes Coumadin daily along with Pacerone  .  Cardiomyopathy     Myoview with prior infarct- 2012; EF 25% TEE July 2013; EF normalized 2015  . Myocardial infarction     hx of MI  . ANXIETY     takes Xanax nightly  . ASTHMA     Albuterol prn and Advair daily;also takes Prednisone daily  . GERD        . DIABETES MELLITUS, TYPE II        . HYPERTENSION        . COPD (chronic obstructive pulmonary disease)   . O2 dependent     uses oxygen 2 and 1/2 liters per Nora at hs  . SLEEP APNEA, OBSTRUCTIVE     doesn't use CPAP  . Peripheral neuropathy   . Arthritis   . Chronic constipation     takes OTC stool softener  . Diverticulitis   . Glaucoma   . DEPRESSION     takes Zoloft and Doxepin daily  . Anemia     supposed to be taking Vit B but doesn't     Surgical History:  Past Surgical History  Procedure Laterality Date  . Tonsillectomy    . Acne cyst removal      2 on back   . Cardioversion  04/18/2012    Procedure: CARDIOVERSION;  Surgeon: Fay Records, MD;  Location: [place];  Service: Cardiovascular;  Laterality: N/A;  . Tee without cardioversion  04/25/2012    Procedure: TRANSESOPHAGEAL ECHOCARDIOGRAM (TEE);  Surgeon: Thayer Headings, MD;  Location: [facility]  ENDOSCOPY;  Service: Cardiovascular;  Laterality: N/A;  . Cardioversion  04/25/2012    Procedure: CARDIOVERSION;  Surgeon: Thayer Headings, MD;  Location: [place];  Service: Cardiovascular;  Laterality: N/A;  . Cardioversion  04/25/2012    Procedure: CARDIOVERSION;  Surgeon: Fay Records, MD;  Location: [place];  Service: Cardiovascular;  Laterality: N/A;  . Cardioversion  05/09/2012    Procedure: CARDIOVERSION;  Surgeon: Sherren Mocha, MD;  Location: [place];  Service: Cardiovascular;  Laterality: N/A;  changed from crenshaw to cooper by trish/leone-endo  . Colonoscopy    . Esophagogastroduodenoscopy    . Total knee arthroplasty Right 06/15/2014    Procedure: TOTAL KNEE ARTHROPLASTY;  Surgeon: Renette Butters, MD;  Location: [name];  Service: Orthopedics;  Laterality: Right;  .  Colonoscopy with propofol N/A 10/21/2014    Procedure: COLONOSCOPY WITH PROPOFOL;  Surgeon: Ladene Artist, MD;  Location: [facility] ENDOSCOPY;  Service: Endoscopy;  Laterality: N/A;  . Tee without cardioversion N/A 03/15/2015    Procedure: TRANSESOPHAGEAL ECHOCARDIOGRAM (TEE);  Surgeon: Thayer Headings, MD;  Location: [place];  Service: Cardiovascular;  Laterality: N/A;  . Cardioversion N/A 03/15/2015    Procedure: CARDIOVERSION;  Surgeon: Thayer Headings, MD;  Location: [facility] ENDOSCOPY;  Service: Cardiovascular;  Laterality: N/A;     Prescriptions prior to admission  Medication Sig Dispense Refill Last Dose  . albuterol (PROVENTIL HFA;VENTOLIN HFA) 108 (90 BASE) MCG/ACT inhaler Inhale 2 puffs into the lungs 4 (four) times daily.   03/30/2015 at Unknown time  . ALPRAZolam (XANAX) 0.5 MG tablet TAKE 1 TABLET BY MOUTH TWICE A DAY AS NEEDED FOR ANXIETY (Patient taking differently: Take 0.5 mg by mouth at bedtime. ) 60 tablet 2 03/29/2015 at Unknown time  . amiodarone (PACERONE) 200 MG tablet Take 1 tablet (200 mg total) by mouth daily. 30 tablet 3 03/29/2015 at Unknown time  . b complex vitamins tablet Take 1 tablet by mouth daily.   Past Week at Unknown time  . carisoprodol (SOMA) 350 MG tablet Take 350 mg by mouth at bedtime.    03/29/2015 at Unknown time  . clobetasol cream (TEMOVATE) 8.29 % Apply 1 application topically daily as needed (itching).    03/29/2015 at Unknown time  . diltiazem (CARDIZEM CD) 240 MG 24 hr capsule Take 1 capsule (240 mg total) by mouth daily. 30 capsule 1 03/30/2015 at Unknown time  . doxepin (SINEQUAN) 25 MG capsule Take 25 mg by mouth 2 (two) times daily.   03/29/2015 at Unknown time  . ferrous sulfate 325 (65 FE) MG tablet Take 325 mg by mouth daily.   Past Week at Unknown time  . Fluticasone-Salmeterol (ADVAIR) 250-50 MCG/DOSE AEPB Inhale 2 puffs into the lungs 2 (two) times daily.    03/30/2015 at Unknown time  . furosemide (LASIX) 80 MG tablet Take 1 tablet (80 mg total) by  mouth 2 (two) times daily. 60 tablet 3 03/30/2015 at Unknown time  . hydrocortisone 2.5 % cream Apply topically 2 (two) times daily. (Patient taking differently: Apply 1 application topically 2 (two) times daily. ) 30 g 5 03/29/2015 at Unknown time  . hydrOXYzine (ATARAX/VISTARIL) 25 MG tablet TAKE 1 TABLET BY MOUTH 3 TIMES A DAY FOR ITCHING (Patient taking differently: TAKE 1 TABLET BY MOUTH 3 TIMES A DAY AS NEEDED FOR ITCHING) 30 tablet 1 03/29/2015 at Unknown time  . ibuprofen (ADVIL,MOTRIN) 200 MG tablet Take 400 mg by mouth at bedtime as needed for mild pain (pain).    Past  Week at Unknown time  . Linaclotide (LINZESS) 145 MCG CAPS capsule Take 145 mcg by mouth daily as needed (Crampy pain).    Past Week at Unknown time  . lisinopril (PRINIVIL,ZESTRIL) 5 MG tablet TAKE 1 TABLET (5 MG TOTAL) BY MOUTH DAILY. 30 tablet 3 03/29/2015 at Unknown time  . Loratadine 10 MG CAPS Take 10 mg by mouth daily as needed (itching).    Past Week at Unknown time  . nebivolol (BYSTOLIC) 5 MG tablet Take 1 tablet (5 mg total) by mouth daily. 30 tablet 1 03/30/2015 at 1000  . omeprazole (PRILOSEC) 20 MG capsule Take 20 mg by mouth daily with supper.    03/29/2015 at Unknown time  . polyethylene glycol powder (GLYCOLAX/MIRALAX) powder Take 255 g by mouth daily. (Patient taking differently: Take 17 g by mouth 2 (two) times daily as needed (constipation). Mix in 8 oz liquid and drink) 255 g 3 Past Week at Unknown time  . ranolazine (RANEXA) 500 MG 12 hr tablet Take 1 tablet (500 mg total) by mouth 2 (two) times daily. 60 tablet 1 03/30/2015 at Unknown time  . sertraline (ZOLOFT) 100 MG tablet Take 2 tablets (200 mg total) by mouth at bedtime. (Patient taking differently: Take 200 mg by mouth daily with supper. ) 180 tablet 3 03/29/2015 at Unknown time  . tiZANidine (ZANAFLEX) 2 MG tablet Take 1 tablet (2 mg total) by mouth 3 (three) times daily as needed for muscle spasms. (Patient taking differently: Take 2 mg by mouth at bedtime.  ) 30 tablet 4 03/29/2015 at Unknown time  . Tolnaftate POWD Apply 1 application topically daily. For jock itch   unkn  . traZODone (DESYREL) 100 MG tablet Take 50 mg by mouth at bedtime. For sleep  1 03/29/2015 at Unknown time  . warfarin (COUMADIN) 5 MG tablet Take as directed by anticoagulation clinic (Patient taking differently: Take 2.5-5 mg by mouth daily with supper. Take 1 tablet (5 mg) Monday, Wednesday, Friday and Saturday, take 1/2 tablet (2.5 mg) Tuesday, Thursday and Sunday or as directed by anticoagulation clinic) 30 tablet 3 03/29/2015 at Unknown time  . witch hazel-glycerin (TUCKS) pad Apply 1 application topically as needed for itching, irritation or hemorrhoids.    unkn  . zolpidem (AMBIEN) 10 MG tablet TAKE 1 TABLET BY MOUTH AT BEDTIME AS NEEDED FOR SLEEP (Patient taking differently: TAKE 1 TABLET BY MOUTH AT BEDTIME) 30 tablet 5 03/29/2015 at Unknown time  . diphenhydrAMINE (BENADRYL) 50 MG tablet Take 1 tablet (50 mg total) by mouth every 6 (six) hours as needed for itching. (Patient not taking: Reported on 03/30/2015) 30 tablet 2 Not Taking at Unknown time  . hydrocortisone (ANUSOL-HC) 25 MG suppository Place 1 suppository (25 mg total) rectally every 12 (twelve) hours. (Patient not taking: Reported on 03/30/2015) 12 suppository 1 Not Taking at Unknown time  . metFORMIN (GLUCOPHAGE) 500 MG tablet Take 1,000 mg by mouth daily with breakfast.    Taking  . PROAIR HFA 108 (90 BASE) MCG/ACT inhaler INHALE 2 PUFFS INTO THE LUNGS 4 TIMES A DAY (Patient not taking: Reported on 03/30/2015) 8.5 Inhaler 5 Not Taking at Unknown time    Inpatient Medications:  . albuterol  2.5 mg Inhalation QID  . ALPRAZolam  0.5 mg Oral QHS  . amiodarone  200 mg Oral Daily  . B-complex with vitamin C  1 tablet Oral Daily  . carisoprodol  350 mg Oral QHS  . diltiazem  240 mg Oral Daily  . doxepin  25 mg Oral BID  . ferrous sulfate  325 mg Oral Daily  . furosemide  80 mg Intravenous BID  . insulin aspart  0-15  Units Subcutaneous TID WC  . mometasone-formoterol  2 puff Inhalation BID  . nebivolol  5 mg Oral Daily  . pantoprazole  40 mg Oral Daily  . potassium chloride  40 mEq Oral BID  . ranolazine  500 mg Oral BID  . sertraline  200 mg Oral Q supper  . sodium chloride  3 mL Intravenous Q12H  . tiZANidine  2 mg Oral QHS  . traZODone  50 mg Oral QHS  . zolpidem  10 mg Oral QHS    Allergies:  Allergies  Allergen Reactions  . Pravastatin Sodium Other (See Comments)    myalgia  . Simvastatin Other (See Comments)    severe leg cramps  . Statins Other (See Comments)    myalgia  . Tape Other (See Comments)    Skin Tears Use Paper Tape Only    History   Social History  . Marital Status: Divorced    Spouse Name: N/A  . Number of Children: 2  . Years of Education: N/A   Occupational History  . retired/disabled. prev worked in Therapist, sports.    Social History Main Topics  . Smoking status: Former Smoker -- 2.00 packs/day for 30 years    Types: Cigarettes    Quit date: 10/16/2007  . Smokeless tobacco: Never Used  . Alcohol Use: No  . Drug Use: No  . Sexual Activity: Not on file   Other Topics Concern  . Not on file   Social History Narrative   Lives alone.     Family History  Problem Relation Age of Onset  . COPD Mother   . Asthma Mother   . Colon polyps Mother   . Allergies Mother   . Asthma Maternal Grandmother   . Colon cancer Neg Hx      Review of Systems: All other systems reviewed and are otherwise negative except as noted above.  Physical Exam: Filed Vitals:   03/31/15 0529 03/31/15 0823 03/31/15 1100 03/31/15 1134  BP:   94/64   Pulse:   84   Temp: 97.8 F (36.6 C) 97.4 F (36.3 C)  98.4 F (36.9 C)  TempSrc: Oral Oral  Oral  Resp:   20   Height:      Weight: 321 lb 9.6 oz (145.877 kg)     SpO2:   100% 100%    GEN- The patient is morbidly obese appearing, alert and oriented x 3 today.   HEENT: normocephalic, atraumatic; sclera clear,  conjunctiva pink; hearing intact; oropharynx clear; neck supple , no thyromegally Lungs- Clear to ausculation bilaterally, normal work of breathing.  No wheezes, rales, rhonchi Heart- Irregular rate and rhythm  GI- soft, non-tender, non-distended, bowel sounds present  Extremities- no clubbing, cyanosis, 1-2+ LEE edema  MS- no significant deformity or atrophy Skin- warm and dry, no rash or lesion Psych- euthymic mood, full affect Neuro- strength and sensation are intact  Labs:   Lab Results  Component Value Date   WBC 7.0 03/31/2015   HGB 8.2* 03/31/2015   HCT 28.7* 03/31/2015   MCV 69.5* 03/31/2015   PLT 371 03/31/2015     Recent Labs Lab 03/31/15 0258  NA 135  K 4.0  CL 101  CO2 27  BUN 14  CREATININE 0.81  CALCIUM 8.5*  GLUCOSE 109*      Radiology/Studies: Dg  Chest 2 View 03/30/2015   CLINICAL DATA:  Chest pain and right shoulder pain after falling down steps coming out of a trailer today.  EXAM: CHEST  2 VIEW  COMPARISON:  01/26/2014  FINDINGS: There is new cardiomegaly with a new small right pleural effusion. There is slight pulmonary vascular congestion. No infiltrates. No acute osseous abnormality.  IMPRESSION: New cardiomegaly with pulmonary vascular congestion and a new small right pleural effusion. Findings consistent with mild congestive heart failure.   Electronically Signed   By: Lorriane Shire M.D.   On: 03/30/2015 15:24   Dg Shoulder Right 03/30/2015   CLINICAL DATA:  59 year old male with right shoulder pain after falling down several steps today. Decreased range of motion.  EXAM: RIGHT SHOULDER - 2+ VIEW  COMPARISON:  Concurrently obtained chest x-ray.  FINDINGS: No acute fracture or malalignment. Degenerative osteoarthritis present at the acromioclavicular joint. Clavicle and scapula appear intact. Visualized thorax demonstrates no evidence of acute rib fracture. Normal bony mineralization. There is a small right-sided pleural effusion.  IMPRESSION: 1. No acute  fracture or malalignment. 2. AC joint osteoarthritis. 3. Small right pleural effusion.   Electronically Signed   By: Jacqulynn Cadet M.D.   On: 03/30/2015 15:23    ZOX:WRUEAV fibrillation, rate 86, RBBB, LAD  TELEMETRY: atrial fibrillation, rate controlled  Assessment/Plan: 1.  Persistent atrial fibrillation The patient has persistent recurrent atrial fibrillation on Amiodarone and has developed hyperthyroidism.  Discontinue amiodarone at this time Would recommend internal medicine evaluation to help guide therapy.  With severe LA enlargement and morbid obesity, likelihood of maintaining SR is significantly decreased.  For now, would pursue rate control and recommend lifestyle modification.   Wean off diltiazem with LE edema and up-titrate beta blocker as able Can consider alternative AAD therapy with Tikosyn after amiodarone washout if he feels badly with rate control or cannot be rate controlled.   2.  Sleep apnea Previously intolerant of CPAP Recommend repeat pulmonary evaluation to reconsider  3.  Morbid obesity Obesity limits our ability to maintain SR He is interested in AF nutrition program - will refer  4.  Hyperthyroidism IM consult He will likely need PTU  5.  Syncope Likely related to AF with RVR vs orthostatic hypotension Continue telemetry Follow up myoview results  He should have close outpatient follow up with the AF clinic at discharge.   Signed, Chanetta Marshall, NP 03/31/2015 12:37 PM  EP Attending  Patient seen and examined. Agree with above with minimal modifications. He has persistent atrial fib with hyperthyroidism based on a very low TSH. He will need PTU or methimazole, uptitration of beta blocker as tolerated, discontinuation of cardizem (likely making edema worse) IV diuretic therapy as needed for volume overload, and if he feels poorly once euthyroid and amio has washed out, could consider Tikosyn if rate difficult to control or the patient feels  poorly in atrial fib. With RBBB/LAFB, he is at risk for transient CHB/Stokes Adams attacks and could consider ILR. I would like to defer decision on this to Dr. [name].  Mikle Bosworth.D.

## 2015-03-31 NOTE — Progress Notes (Signed)
Utilization review complete. Vetta Couzens RN CCM Case Mgmt phone [phone] 

## 2015-03-31 NOTE — Progress Notes (Signed)
Pt had 9 beats of Vtach. Pt states that he did feel his heart racing at this time. Notified on call PA. Will continue to monitor pt.

## 2015-03-31 NOTE — Progress Notes (Signed)
Patient Name: [name] Date of Encounter: 03/31/2015  Principal Problem:   Syncope Active Problems:   Controlled diabetes mellitus type II without complication   Morbid obesity   Essential hypertension   Anticoagulated on warfarin   Chronic diastolic heart failure   Bundle branch block, right and extreme left anterior fascicular   Anemia   Abnormal TSH   Acute on chronic diastolic heart failure   Persistent atrial fibrillation   Primary Cardiologist: Dr [name]  Patient Profile: 59 yo male w/ persistent recurrent afib, MV previously w/ ?inf infarct, morbid obesity, HTN, CAD, COPD/Asthma,DM, CHF with prior LVD but with normalization of EF by recent echo,recent diagnosis of amiodarone induced thyrotoxicosis. Recent DCCV but did not stay in SR > 1 day. Rate control poor, admitted 06/15 w/ increasing SOB.  SUBJECTIVE: No presyncope/syncope since admit, had palpitations this am, gets at home. He was sitting up, otherwise asymptomatic. Sig problem w/ DOE room-room at home. Has lost weight, but has LE edema.    OBJECTIVE Filed Vitals:   03/31/15 0100 03/31/15 0515 03/31/15 0529 03/31/15 0823  BP: 107/57 107/56    Pulse: 83 72    Temp:   97.8 F (36.6 C) 97.4 F (36.3 C)  TempSrc:   Oral Oral  Resp: 20 22    Height:      Weight:   321 lb 9.6 oz (145.877 kg)   SpO2: 99% 100%      Intake/Output Summary (Last 24 hours) at 03/31/15 1012 Last data filed at 03/31/15 0843  Gross per 24 hour  Intake   1300 ml  Output   1350 ml  Net    -50 ml   Filed Weights   03/30/15 1414 03/30/15 2042 03/31/15 0529  Weight: 330 lb (149.687 kg) 322 lb 4.8 oz (146.194 kg) 321 lb 9.6 oz (145.877 kg)    PHYSICAL EXAM General: Well developed, well nourished, male in no acute distress. Head: Normocephalic, atraumatic.  Neck: Supple without bruits, JVD 9 cm. Lungs:  Resp regular and unlabored, decreased BS bases. Heart: Irreg irreg, S1, S2, no S3, S4, or murmur; no rub. Sounds slightly  distant Abdomen: Obese, Soft,  Diffusely tender, non-distended, BS + x 4.  Extremities: No clubbing, cyanosis, 1-2+ LE edema, LLE worse than RLE chronically Neuro: Alert and oriented X 3. Moves all extremities spontaneously. Psych: Normal affect.  LABS: CBC: Recent Labs  03/30/15 1424 03/30/15 1453 03/31/15 0908  WBC 8.3  --  7.0  HGB 7.7* 9.5* 8.2*  HCT 26.5* 28.0* 28.7*  MCV 69.9*  --  69.5*  PLT 344  --  371   INR: Recent Labs  03/30/15 1424  INR 8.11*   Basic Metabolic Panel: Recent Labs  03/30/15 1424 03/30/15 1453 03/31/15 0258  NA 138 138 135  K 4.3 4.3 4.0  CL 106 103 101  CO2 24  --  27  GLUCOSE 107* 101* 109*  BUN 14 16 14   CREATININE 0.71 0.80 0.81  CALCIUM 8.8*  --  8.5*   Cardiac Enzymes: Recent Labs  03/30/15 2100 03/31/15 0258  TROPONINI <0.03 <0.03    Recent Labs  03/30/15 1444  TROPIPOC 0.00   BNP:  B NATRIURETIC PEPTIDE  Date/Time Value Ref Range Status  03/30/2015 02:24 PM 506.3* 0.0 - 100.0 pg/mL Final   Lab Results  Component Value Date   TSH 0.04* 02/23/2015    TELE:  Atrial fib, rate generally OK, NSVT episode, see strips  Radiology/Studies: Dg Chest 2 View 03/30/2015   CLINICAL DATA:  Chest pain and right shoulder pain after falling down steps coming out of a trailer today.  EXAM: CHEST  2 VIEW  COMPARISON:  01/26/2014  FINDINGS: There is new cardiomegaly with a new small right pleural effusion. There is slight pulmonary vascular congestion. No infiltrates. No acute osseous abnormality.  IMPRESSION: New cardiomegaly with pulmonary vascular congestion and a new small right pleural effusion. Findings consistent with mild congestive heart failure.   Electronically Signed   By: Lorriane Shire M.D.   On: 03/30/2015 15:24   Dg Shoulder Right 03/30/2015   CLINICAL DATA:  59 year old male with right shoulder pain after falling down several steps today. Decreased range of motion.  EXAM: RIGHT SHOULDER - 2+ VIEW  COMPARISON:   Concurrently obtained chest x-ray.  FINDINGS: No acute fracture or malalignment. Degenerative osteoarthritis present at the acromioclavicular joint. Clavicle and scapula appear intact. Visualized thorax demonstrates no evidence of acute rib fracture. Normal bony mineralization. There is a small right-sided pleural effusion.  IMPRESSION: 1. No acute fracture or malalignment. 2. AC joint osteoarthritis. 3. Small right pleural effusion.   Electronically Signed   By: Jacqulynn Cadet M.D.   On: 03/30/2015 15:23     Current Medications:  . albuterol  2.5 mg Inhalation QID  . ALPRAZolam  0.5 mg Oral QHS  . amiodarone  200 mg Oral Daily  . B-complex with vitamin C  1 tablet Oral Daily  . carisoprodol  350 mg Oral QHS  . diltiazem  240 mg Oral Daily  . doxepin  25 mg Oral BID  . ferrous sulfate  325 mg Oral Daily  . furosemide  80 mg Intravenous BID  . insulin aspart  0-15 Units Subcutaneous TID WC  . mometasone-formoterol  2 puff Inhalation BID  . nebivolol  5 mg Oral Daily  . pantoprazole  40 mg Oral Daily  . potassium chloride  40 mEq Oral BID  . ranolazine  500 mg Oral BID  . sertraline  200 mg Oral Q supper  . sodium chloride  3 mL Intravenous Q12H  . tiZANidine  2 mg Oral QHS  . traZODone  50 mg Oral QHS  . zolpidem  10 mg Oral QHS      ASSESSMENT AND PLAN: Principal Problem:   Syncope - Does not recall palpitations at that time - has had at home, no syncope at home  Active Problems:   Controlled diabetes mellitus type II without complication - continue SSI while in hospital    Morbid obesity - has lost some weight, dietary changes encouraged    Essential hypertension - good control now    Anticoagulated on warfarin - therapeutic INR    Acute on Chronic diastolic heart failure - EF normal by TEE 03/15/2015 - Continue Lasix 80 mg IV BID for now - follow weights, I/O    Bundle branch block, right and extreme left anterior fascicular - follow    Anemia - consistent  hgb < 10 since 2015 - no eval, guaiac stools - iron nl 2015, recheck since MCV low    Abnormal TSH - suspected due to amio, recheck - may need to d/c amio if no better     Persistent atrial fibrillation - amio 200 mg and dilt 240 mg - with LE edema, Diltiazem may not be best med - with low TSH, Amio may not be best med - 2014 echo showed severe LA dilatation, mod RA dilatation -  EP consult to determine treatment options  Signed, Lenoard Aden 10:12 AM 03/31/2015  Agree with note by Rosaria Ferries PA-C  Pt admitted with syncope. Described CP as well. On coumadin AC. He has preserved LV function with DD and 2-3+ bilateral LE edema. Failed recent TEE DCCV. With large LA wonder whether he is convertible. Had NSVT on tele. Will repeat Lexiscan in light of recent CP. Get EP to discuss plans for AF. Cont to diurese.   Lorretta Harp, M.D., [place], [facility], [name] [address] [place], [place]  [id number]  [phone] 03/31/2015 11:14 AM

## 2015-04-01 ENCOUNTER — Encounter (HOSPITAL_COMMUNITY): Payer: Commercial Managed Care - HMO

## 2015-04-01 ENCOUNTER — Inpatient Hospital Stay (INDEPENDENT_AMBULATORY_CARE_PROVIDER_SITE_OTHER): Payer: Commercial Managed Care - HMO

## 2015-04-01 DIAGNOSIS — R079 Chest pain, unspecified: Secondary | ICD-10-CM

## 2015-04-01 DIAGNOSIS — I509 Heart failure, unspecified: Secondary | ICD-10-CM

## 2015-04-01 DIAGNOSIS — E0590 Thyrotoxicosis, unspecified without thyrotoxic crisis or storm: Secondary | ICD-10-CM

## 2015-04-01 DIAGNOSIS — D509 Iron deficiency anemia, unspecified: Secondary | ICD-10-CM

## 2015-04-01 LAB — NM MYOCAR MULTI W/SPECT W/WALL MOTION / EF
CSEPEW: 1 METS
LV sys vol: 259 mL
LVDIAVOL: 164 mL
MPHR: 160 {beats}/min
Peak HR: 104 {beats}/min
Percent HR: 65 %
RATE: 0.48
Rest HR: 93 {beats}/min
SDS: 0
SRS: 5
SSS: 5
TID: 1.04

## 2015-04-01 LAB — HEMOGLOBIN AND HEMATOCRIT, BLOOD
HCT: 27.1 % — ABNORMAL LOW (ref 39.0–52.0)
Hemoglobin: 7.8 g/dL — ABNORMAL LOW (ref 13.0–17.0)

## 2015-04-01 LAB — BASIC METABOLIC PANEL
ANION GAP: 9 (ref 5–15)
BUN: 15 mg/dL (ref 6–20)
CALCIUM: 8.5 mg/dL — AB (ref 8.9–10.3)
CO2: 27 mmol/L (ref 22–32)
CREATININE: 0.86 mg/dL (ref 0.61–1.24)
Chloride: 102 mmol/L (ref 101–111)
GFR calc non Af Amer: >60 mL/min (ref >60)
Glucose, Bld: 109 mg/dL — ABNORMAL HIGH (ref 65–99)
Potassium: 4.4 mmol/L (ref 3.5–5.1)
Sodium: 138 mmol/L (ref 135–145)

## 2015-04-01 LAB — TSH: TSH: 0.011 u[IU]/mL — ABNORMAL LOW (ref 0.350–4.500)

## 2015-04-01 LAB — GLUCOSE, CAPILLARY
GLUCOSE-CAPILLARY: 88 mg/dL (ref 65–99)
GLUCOSE-CAPILLARY: 129 mg/dL — AB (ref 65–99)
Glucose-Capillary: 88 mg/dL (ref 65–99)
Glucose-Capillary: 96 mg/dL (ref 65–99)

## 2015-04-01 LAB — IRON AND TIBC
Iron: 16 ug/dL — ABNORMAL LOW (ref 45–182)
SATURATION RATIOS: 4 % — AB (ref 17.9–39.5)
TIBC: 434 ug/dL (ref 250–450)
UIBC: 418 ug/dL

## 2015-04-01 LAB — HEPATIC FUNCTION PANEL
ALBUMIN: 3.1 g/dL — AB (ref 3.5–5.0)
ALK PHOS: 72 U/L (ref 38–126)
ALT: 55 U/L (ref 17–63)
AST: 32 U/L (ref 15–41)
BILIRUBIN INDIRECT: 0.6 mg/dL (ref 0.3–0.9)
Bilirubin, Direct: 0.2 mg/dL (ref 0.1–0.5)
TOTAL PROTEIN: 5.8 g/dL — AB (ref 6.5–8.1)
Total Bilirubin: 0.8 mg/dL (ref 0.3–1.2)

## 2015-04-01 LAB — FOLATE: FOLATE: 30.7 ng/mL (ref >5.9)

## 2015-04-01 LAB — PROTIME-INR
INR: 2.09 — ABNORMAL HIGH (ref 0.00–1.49)
Prothrombin Time: 23.3 seconds — ABNORMAL HIGH (ref 11.6–15.2)

## 2015-04-01 LAB — T4, FREE: Free T4: 4.79 ng/dL — ABNORMAL HIGH (ref 0.61–1.12)

## 2015-04-01 LAB — FERRITIN: Ferritin: 10 ng/mL — ABNORMAL LOW (ref 24–336)

## 2015-04-01 MED ORDER — PERFLUTREN LIPID MICROSPHERE
1.0000 mL | INTRAVENOUS | Status: AC | PRN
Start: 2015-04-01 — End: 2015-04-01
  Administered 2015-04-01: 3 mL via INTRAVENOUS
  Filled 2015-04-01: qty 10

## 2015-04-01 MED ORDER — WARFARIN - PHARMACIST DOSING INPATIENT
Freq: Every day | Status: DC
  Administered 2015-04-02 – 2015-04-04 (×3)

## 2015-04-01 MED ORDER — TECHNETIUM TC 99M SESTAMIBI - CARDIOLITE
10.0000 | Freq: Once | INTRAVENOUS | Status: AC | PRN
  Administered 2015-04-01: 14:00:00 10 via INTRAVENOUS

## 2015-04-01 MED ORDER — WARFARIN SODIUM 5 MG PO TABS
5.0000 mg | ORAL_TABLET | Freq: Once | ORAL | Status: AC
  Administered 2015-04-01: 5 mg via ORAL
  Filled 2015-04-01: qty 1

## 2015-04-01 MED ORDER — DILTIAZEM HCL ER COATED BEADS 120 MG PO CP24
120.0000 mg | ORAL_CAPSULE | Freq: Every day | ORAL | Status: DC
  Administered 2015-04-01 – 2015-04-04 (×4): 120 mg via ORAL
  Filled 2015-04-01 (×4): qty 1

## 2015-04-01 MED ORDER — REGADENOSON 0.4 MG/5ML IV SOLN
0.4000 mg | Freq: Once | INTRAVENOUS | Status: AC
  Administered 2015-04-01: 0.4 mg via INTRAVENOUS
  Filled 2015-04-01: qty 5

## 2015-04-01 MED ORDER — REGADENOSON 0.4 MG/5ML IV SOLN
INTRAVENOUS | Status: AC
  Administered 2015-04-01: 0.4 mg via INTRAVENOUS
  Filled 2015-04-01: qty 5

## 2015-04-01 NOTE — Care Management Note (Signed)
Case Management Note  Patient Details  Name: [name] MRN: [id number] Date of Birth: 02/11/1956  Subjective/Objective:    Pt admitted for syncope. Plan for stress test today.                 Action/Plan: No needs identified at this time. CM will continue to monitor.    Expected Discharge Date:                  Expected Discharge Plan:  Home/Self Care  In-House Referral:     Discharge planning Services  CM Consult  Post Acute Care Choice:    Choice offered to:     DME Arranged:    DME Agency:     HH Arranged:    HH Agency:     Status of Service:  In process, will continue to follow  Medicare Important Message Given:  Yes Date Medicare IM Given:  04/01/15 Medicare IM give by:  Jacqlyn Krauss, RN,BSN  Date Additional Medicare IM Given:    Additional Medicare Important Message give by:     If discussed at [place] of Stay Meetings, dates discussed:    Additional Comments:  Bethena Roys, RN 04/01/2015, 12:17 PM

## 2015-04-01 NOTE — Progress Notes (Signed)
Patient Name: [name] Date of Encounter: 04/01/2015  Principal Problem:   Syncope Active Problems:   Controlled diabetes mellitus type II without complication   Morbid obesity   Essential hypertension   Anticoagulated on warfarin   Chronic diastolic heart failure   Bundle branch block, right and extreme left anterior fascicular   Anemia   Abnormal TSH   Acute on chronic diastolic heart failure   Persistent atrial fibrillation   Primary Cardiologist: Dr [name]  Patient Profile: 59 yo male w/ persistent recurrent afib, MV previously w/ ?inf infarct, morbid obesity, HTN, CAD, COPD/Asthma,DM, CHF with prior LVD but with normalization of EF by TEE (05/31),recent diagnosis of amiodarone induced thyrotoxicosis. Recent DCCV but did not stay in SR > 1 day. Rate control poor, admitted 06/15 w/ increasing SOB.  SUBJECTIVE: Breathing some better today, hungry, just had stress portion of test. No chest pain or palpitations  OBJECTIVE Filed Vitals:   04/01/15 0842 04/01/15 0912 04/01/15 0914 04/01/15 0915  BP: 100/72 87/56 99/55  110/55  Pulse:      Temp:      TempSrc:      Resp:      Height:      Weight:      SpO2:        Intake/Output Summary (Last 24 hours) at 04/01/15 0942 Last data filed at 04/01/15 0801  Gross per 24 hour  Intake    480 ml  Output   6500 ml  Net  -6020 ml   Filed Weights   03/30/15 1414 03/30/15 2042 03/31/15 0529  Weight: 330 lb (149.687 kg) 322 lb 4.8 oz (146.194 kg) 321 lb 9.6 oz (145.877 kg)    PHYSICAL EXAM General: Well developed, well nourished, male in no acute distress. Head: Normocephalic, atraumatic.  Neck: Supple without bruits, JVD 8-9 cm, no sig HJR. Lungs:  Resp regular and unlabored, decreased BS bases. Heart: Irreg irreg, S1, S2, no S3, S4, or murmur; no rub. Abdomen: Soft, non-tender, non-distended, BS + x 4.  Extremities: No clubbing, cyanosis, edema.  Neuro: Alert and oriented X 3. Moves all extremities  spontaneously. Psych: Normal affect.  LABS: CBC: Recent Labs  03/30/15 1424  03/31/15 0908 04/01/15 0251  WBC 8.3  --  7.0  --   HGB 7.7*  < > 8.2* 7.8*  HCT 26.5*  < > 28.7* 27.1*  MCV 69.9*  --  69.5*  --   PLT 344  --  371  --   < > = values in this interval not displayed. INR: Recent Labs  03/30/15 1424  INR 0.34*   Basic Metabolic Panel: Recent Labs  03/31/15 0258 03/31/15 0908 04/01/15 0251  NA 135  --  138  K 4.0  --  4.4  CL 101  --  102  CO2 27  --  27  GLUCOSE 109*  --  109*  BUN 14  --  15  CREATININE 0.81  --  0.86  CALCIUM 8.5*  --  8.5*  MG  --  1.8  --    Cardiac Enzymes: Recent Labs  03/30/15 2100 03/31/15 0258 03/31/15 0908  TROPONINI <0.03 <0.03 <0.03    Recent Labs  03/30/15 1444  TROPIPOC 0.00   BNP:  B NATRIURETIC PEPTIDE  Date/Time Value Ref Range Status  03/30/2015 02:24 PM 506.3* 0.0 - 100.0 pg/mL Final   Thyroid Function Tests: Recent Labs  04/01/15 0251  TSH 0.011*   Anemia Panel: Recent Labs  04/01/15  0251  FOLATE 30.7  FERRITIN 10*  TIBC 434  IRON 16*    TELE: Atrial fib, generally RVR       Radiology/Studies: Dg Chest 2 View 03/30/2015   CLINICAL DATA:  Chest pain and right shoulder pain after falling down steps coming out of a trailer today.  EXAM: CHEST  2 VIEW  COMPARISON:  01/26/2014  FINDINGS: There is new cardiomegaly with a new small right pleural effusion. There is slight pulmonary vascular congestion. No infiltrates. No acute osseous abnormality.  IMPRESSION: New cardiomegaly with pulmonary vascular congestion and a new small right pleural effusion. Findings consistent with mild congestive heart failure.   Electronically Signed   By: Lorriane Shire M.D.   On: 03/30/2015 15:24   Dg Shoulder Right 03/30/2015   CLINICAL DATA:  59 year old male with right shoulder pain after falling down several steps today. Decreased range of motion.  EXAM: RIGHT SHOULDER - 2+ VIEW  COMPARISON:  Concurrently obtained  chest x-ray.  FINDINGS: No acute fracture or malalignment. Degenerative osteoarthritis present at the acromioclavicular joint. Clavicle and scapula appear intact. Visualized thorax demonstrates no evidence of acute rib fracture. Normal bony mineralization. There is a small right-sided pleural effusion.  IMPRESSION: 1. No acute fracture or malalignment. 2. AC joint osteoarthritis. 3. Small right pleural effusion.   Electronically Signed   By: Jacqulynn Cadet M.D.   On: 03/30/2015 15:23     Current Medications:  . albuterol  2.5 mg Inhalation QID  . ALPRAZolam  0.5 mg Oral QHS  . B-complex with vitamin C  1 tablet Oral Daily  . carisoprodol  350 mg Oral QHS  . diltiazem  120 mg Oral Daily  . doxepin  25 mg Oral BID  . ferrous sulfate  325 mg Oral Daily  . furosemide  80 mg Intravenous BID  . insulin aspart  0-15 Units Subcutaneous TID WC  . mometasone-formoterol  2 puff Inhalation BID  . nebivolol  10 mg Oral Daily  . pantoprazole  40 mg Oral Daily  . potassium chloride  40 mEq Oral BID  . ranolazine  500 mg Oral BID  . sertraline  200 mg Oral Q supper  . sodium chloride  3 mL Intravenous Q12H  . tiZANidine  2 mg Oral QHS  . traZODone  50 mg Oral QHS  . zolpidem  10 mg Oral QHS      ASSESSMENT AND PLAN: Principal Problem:  Syncope - Does not recall palpitations at that time - no syncope at home - possibly 2nd to combination of rapid afib and hypoxia  Active Problems:  Controlled diabetes mellitus type II without complication - continue SSI while in hospital   Morbid obesity - has lost some weight, long-term dietary changes encouraged   Essential hypertension - good control now - SBP 90s-100s    Anticoagulated on warfarin - therapeutic INR   Acute on Chronic diastolic heart failure - EF normal by TEE 03/15/2015 - Continue Lasix 80 mg IV BID for now, think can change to PO soon - follow weights, I/O, need weight today   Bundle branch block, right and extreme  left anterior fascicular block - follow   Anemia - consistent hgb < 10 since 2015 - guaiac stools - iron nl 2015, now low, primary MD started him on oral iron. - had colonoscopy by Dr Fuller Plan 10/2014 with 2 polyps removed - Colonoscopy at [facility] on 03/04/2015, showing diverticulosis and hemorrhoids   Abnormal TSH - suspected due to [facility]  -  mother has hyperthyroidism - amio d/c'd, will ck free T4    Persistent atrial fibrillation - was on amio 200 mg and dilt 240 mg - Both d/c'd by EP, 06/16 - up-titration of BB as tolerated - 2014 echo showed severe LA dilatation, mod RA dilatation - Per EP, can consider Tikosyn once amio washes out, Dr [name] to see as OP - With RBBB/LAFB, he is at risk for transient CHB/[place] attacks and could consider ILR, but Dr Lovena Le feels we should leave that to Dr [name]  Signed, Rosaria Ferries , PA-C 9:42 AM 04/01/2015 \ I have seen and examined the patient along with [name], Rhonda , PA-C.  I have reviewed the chart, notes and new data.  I agree with PA's note.  Key new complaints: tired after nuclear scan Key examination changes: irregular rhythm, rate 100-110 at rest, promptly increase with minimal activity Key new findings / data: my preliminary review shows a fixed inferior defect that most likely represents diaphragmatic attenuation artifact. The QGS calculated EF is 37%, but accuracy is uncertain due to irregular rhythm. TEE 2 weeks ago showed normal EF. Labs c/w Fe deficiency anemia  PLAN: Check limited 2D echo for EF - not sure whether he has recurrent tachycardia related cardiomyopathy or just an artifactually low LVEF due to mis-gating of nuclear images. Switch from nebivolol to metoprolol for better rate control with less hypotensive effect that is seen with combined alpha-beta blocker. May need to add digoxin as well.  Cardioversion unlikely to be of lasting benefit until he is euthyroid. Other antiarrhythmic choices are available  (e.g. Dofetilide), but need to wait for amiodarone washout. Rapid heart rate is multifactorial due to hyperthyroidism, anemia and CHF, but is likely to worsen as amiodarone effect declines gradually. Will need GI evaluation for iron deficiency anemia and endocrinology f/u for treatment of hyperthyroidism. Check hemoccult and free T4.  Sanda Klein, MD, [place] [phone] 04/01/2015, 2:08 PM

## 2015-04-01 NOTE — Progress Notes (Signed)
Clarified with Dr. Posey Pronto the card fellow to give pt's daily dose of cardizem tonight. Will cont to monitor pt.

## 2015-04-01 NOTE — Progress Notes (Signed)
ANTICOAGULATION CONSULT NOTE - Initial Consult   Pharmacy Consult for coumadin Indication: atrial fibrillation  Allergies  Allergen Reactions  . Pravastatin Sodium Other (See Comments)    myalgia  . Simvastatin Other (See Comments)    severe leg cramps  . Statins Other (See Comments)    myalgia  . Tape Other (See Comments)    Skin Tears Use Paper Tape Only    Patient Measurements: Height: 5\' 10"  (177.8 cm) Weight: (!) 321 lb 9.6 oz (145.877 kg) IBW/kg (Calculated) : 73 Heparin Dosing Weight:   Vital Signs: Temp: 98.1 F (36.7 C) (06/17 1129) Temp Source: Oral (06/17 1129) BP: 95/74 mmHg (06/17 1129)  Labs:  Recent Labs  03/30/15 1424 03/30/15 1453 03/30/15 2100 03/31/15 0258 03/31/15 0908 04/01/15 0251 04/01/15 1032  HGB 7.7* 9.5*  --   --  8.2* 7.8*  --   HCT 26.5* 28.0*  --   --  28.7* 27.1*  --   PLT 344  --   --   --  371  --   --   LABPROT 35.7*  --   --   --   --   --  23.3*  INR 3.69*  --   --   --   --   --  2.09*  CREATININE 0.71 0.80  --  0.81  --  0.86  --   TROPONINI  --   --  <0.03 <0.03 <0.03  --   --     Estimated Creatinine Clearance: 132 mL/min (by C-G formula based on Cr of 0.86).   Medical History: Past Medical History  Diagnosis Date  . DYSKINESIA, ESOPHAGUS   . FIBROMYALGIA   . HYPERLIPIDEMIA     but unable to take any meds  . INSOMNIA     takes Ambien nightly  . Rash and other nonspecific skin eruption 04/12/2009    no cause found saw dermatologists x 2 and allergist  . Atrial fibrillation     takes Coumadin daily along with Pacerone  . Cardiomyopathy     Myoview with prior infarct- 2012; EF 25% TEE July 2013; EF normalized 2015  . Myocardial infarction     hx of MI  . ANXIETY     takes Xanax nightly  . ASTHMA     Albuterol prn and Advair daily;also takes Prednisone daily  . GERD        . DIABETES MELLITUS, TYPE II        . HYPERTENSION        . COPD (chronic obstructive pulmonary disease)   . O2 dependent     uses  oxygen 2 and 1/2 liters per [place] at hs  . SLEEP APNEA, OBSTRUCTIVE     doesn't use CPAP  . Peripheral neuropathy   . Arthritis   . Chronic constipation     takes OTC stool softener  . Diverticulitis   . Glaucoma   . DEPRESSION     takes Zoloft and Doxepin daily  . Anemia     supposed to be taking Vit B but doesn't    Medications:  Scheduled:  . albuterol  2.5 mg Inhalation QID  . ALPRAZolam  0.5 mg Oral QHS  . B-complex with vitamin C  1 tablet Oral Daily  . carisoprodol  350 mg Oral QHS  . diltiazem  120 mg Oral Daily  . doxepin  25 mg Oral BID  . ferrous sulfate  325 mg Oral Daily  . furosemide  80 mg Intravenous  BID  . insulin aspart  0-15 Units Subcutaneous TID WC  . mometasone-formoterol  2 puff Inhalation BID  . nebivolol  10 mg Oral Daily  . pantoprazole  40 mg Oral Daily  . potassium chloride  40 mEq Oral BID  . ranolazine  500 mg Oral BID  . sertraline  200 mg Oral Q supper  . sodium chloride  3 mL Intravenous Q12H  . tiZANidine  2 mg Oral QHS  . traZODone  50 mg Oral QHS  . zolpidem  10 mg Oral QHS   Infusions:    Assessment: 59 yo who was admitted for recurrent afib. He has been on coumadin for anticoagulation. The admission INR was 3.69 so coumadin has been held since 6/15. He was on amio. It has been dced for now. INR is now down to 2.09 so we'll restart coumadin today.   PTA coumadin = 5mg  qday except 2.5mg  TTSun  Goal of Therapy:  INR 2-3 Monitor platelets by anticoagulation protocol: Yes   Plan:   Coumadin 5mg  PO x1 today Daily INR  Onnie Boer, PharmD Pager: [phone] 04/01/2015 2:05 PM

## 2015-04-01 NOTE — Progress Notes (Signed)
  Echocardiogram 2D Echocardiogram has been performed.  Joelene Millin 04/01/2015, 4:23 PM

## 2015-04-02 DIAGNOSIS — R7989 Other specified abnormal findings of blood chemistry: Secondary | ICD-10-CM

## 2015-04-02 LAB — BASIC METABOLIC PANEL
Anion gap: 8 (ref 5–15)
BUN: 17 mg/dL (ref 6–20)
CO2: 31 mmol/L (ref 22–32)
Calcium: 9.2 mg/dL (ref 8.9–10.3)
Chloride: 99 mmol/L — ABNORMAL LOW (ref 101–111)
Creatinine, Ser: 0.98 mg/dL (ref 0.61–1.24)
GFR calc Af Amer: >60 mL/min (ref >60)
GFR calc non Af Amer: >60 mL/min (ref >60)
GLUCOSE: 108 mg/dL — AB (ref 65–99)
Potassium: 4.6 mmol/L (ref 3.5–5.1)
SODIUM: 138 mmol/L (ref 135–145)

## 2015-04-02 LAB — HEMOGLOBIN AND HEMATOCRIT, BLOOD
HCT: 31.2 % — ABNORMAL LOW (ref 39.0–52.0)
Hemoglobin: 9 g/dL — ABNORMAL LOW (ref 13.0–17.0)

## 2015-04-02 LAB — GLUCOSE, CAPILLARY
GLUCOSE-CAPILLARY: 92 mg/dL (ref 65–99)
Glucose-Capillary: 91 mg/dL (ref 65–99)
Glucose-Capillary: 99 mg/dL (ref 65–99)
Glucose-Capillary: 114 mg/dL — ABNORMAL HIGH (ref 65–99)

## 2015-04-02 LAB — PROTIME-INR
INR: 1.79 — ABNORMAL HIGH (ref 0.00–1.49)
Prothrombin Time: 20.8 seconds — ABNORMAL HIGH (ref 11.6–15.2)

## 2015-04-02 MED ORDER — WARFARIN SODIUM 5 MG PO TABS
5.0000 mg | ORAL_TABLET | Freq: Once | ORAL | Status: AC
  Administered 2015-04-02: 5 mg via ORAL
  Filled 2015-04-02: qty 1

## 2015-04-02 NOTE — Progress Notes (Signed)
ANTICOAGULATION CONSULT NOTE - Follow Up Consult   Pharmacy Consult for coumadin Indication: atrial fibrillation  Allergies  Allergen Reactions  . Pravastatin Sodium Other (See Comments)    myalgia  . Simvastatin Other (See Comments)    severe leg cramps  . Statins Other (See Comments)    myalgia  . Tape Other (See Comments)    Skin Tears Use Paper Tape Only    Patient Measurements: Height: 5\' 10"  (177.8 cm) Weight: (!) 307 lb 4.8 oz (139.39 kg) (input the wrong weight) IBW/kg (Calculated) : 73 Heparin Dosing Weight:   Vital Signs: Temp: 98 F (36.7 C) (06/18 0015) Temp Source: Oral (06/18 0015) BP: 133/119 mmHg (06/18 0952) Pulse Rate: 82 (06/18 0015)  Labs:  Recent Labs  03/30/15 1424  03/30/15 2100 03/31/15 0258 03/31/15 0908 04/01/15 0251 04/01/15 1032 04/02/15 0319  HGB 7.7*  < >  --   --  8.2* 7.8*  --  9.0*  HCT 26.5*  < >  --   --  28.7* 27.1*  --  31.2*  PLT 344  --   --   --  371  --   --   --   LABPROT 35.7*  --   --   --   --   --  23.3* 20.8*  INR 3.69*  --   --   --   --   --  2.09* 1.79*  CREATININE 0.71  < >  --  0.81  --  0.86  --  0.98  TROPONINI  --   --  <0.03 <0.03 <0.03  --   --   --   < > = values in this interval not displayed.  Estimated Creatinine Clearance: 112.9 mL/min (by C-G formula based on Cr of 0.98).   Medical History: Past Medical History  Diagnosis Date  . DYSKINESIA, ESOPHAGUS   . FIBROMYALGIA   . HYPERLIPIDEMIA     but unable to take any meds  . INSOMNIA     takes Ambien nightly  . Rash and other nonspecific skin eruption 04/12/2009    no cause found saw dermatologists x 2 and allergist  . Atrial fibrillation     takes Coumadin daily along with Pacerone  . Cardiomyopathy     Myoview with prior infarct- 2012; EF 25% TEE July 2013; EF normalized 2015  . Myocardial infarction     hx of MI  . ANXIETY     takes Xanax nightly  . ASTHMA     Albuterol prn and Advair daily;also takes Prednisone daily  . GERD         . DIABETES MELLITUS, TYPE II        . HYPERTENSION        . COPD (chronic obstructive pulmonary disease)   . O2 dependent     uses oxygen 2 and 1/2 liters per [place] at hs  . SLEEP APNEA, OBSTRUCTIVE     doesn't use CPAP  . Peripheral neuropathy   . Arthritis   . Chronic constipation     takes OTC stool softener  . Diverticulitis   . Glaucoma   . DEPRESSION     takes Zoloft and Doxepin daily  . Anemia     supposed to be taking Vit B but doesn't    Medications:  Scheduled:  . albuterol  2.5 mg Inhalation QID  . ALPRAZolam  0.5 mg Oral QHS  . B-complex with vitamin C  1 tablet Oral Daily  . carisoprodol  350 mg Oral QHS  . diltiazem  120 mg Oral Daily  . doxepin  25 mg Oral BID  . ferrous sulfate  325 mg Oral Daily  . furosemide  80 mg Intravenous BID  . insulin aspart  0-15 Units Subcutaneous TID WC  . mometasone-formoterol  2 puff Inhalation BID  . nebivolol  10 mg Oral Daily  . pantoprazole  40 mg Oral Daily  . potassium chloride  40 mEq Oral BID  . ranolazine  500 mg Oral BID  . sertraline  200 mg Oral Q supper  . sodium chloride  3 mL Intravenous Q12H  . tiZANidine  2 mg Oral QHS  . traZODone  50 mg Oral QHS  . Warfarin - Pharmacist Dosing Inpatient   Does not apply q1800  . zolpidem  10 mg Oral QHS   Infusions:    Assessment: 59 yo who was admitted for recurrent afib. He has been on coumadin for anticoagulation. The admission INR was 3.69 so coumadin was held from 6/15-6/16. He was on amio. It has been dced for now. INR is now down to 1.79. H/H remains low today but has trended up.  PTA coumadin = 5mg  qday except 2.5mg  TTSun  Goal of Therapy:  INR 2-3 Monitor platelets by anticoagulation protocol: Yes   Plan:   Coumadin 5mg  PO x1 today. May consider increasing dose to 7.5 mg tomorrow if INR does not start trending back up  Daily INR  Albertina Parr, PharmD., BCPS Clinical Pharmacist Pager [phone]

## 2015-04-02 NOTE — Progress Notes (Signed)
Patient Name: [name]      SUBJECTIVE:  59 year old with multiple medical problems including atrial fibrillation .  He been managed with amiodarone and recently underwent cardioversion which failed to hold. He then had recurrent syncope prompting this hospitalization.  He has known bifascicular block.  He has ( perhaps iatrogenic) hyperthyroidism. He has chronic anemia  He has an antecedent history of presumed tachycardia-induced cardiomyopathy Last echo 05/2013 demonstrated EF 60%, mild LVH, LA 58. TEE 02/2015 demonstrated EF 50-55%, mild to moderate MR, mild to moderate TR.   He has undergone rest myoview portion today and will undergo stress portion tomorrow to look for ischemia.  Past Medical History  Diagnosis Date  . DYSKINESIA, ESOPHAGUS   . FIBROMYALGIA   . HYPERLIPIDEMIA     but unable to take any meds  . INSOMNIA     takes Ambien nightly  . Rash and other nonspecific skin eruption 04/12/2009    no cause found saw dermatologists x 2 and allergist  . Atrial fibrillation     takes Coumadin daily along with Pacerone  . Cardiomyopathy     Myoview with prior infarct- 2012; EF 25% TEE July 2013; EF normalized 2015  . Myocardial infarction     hx of MI  . ANXIETY     takes Xanax nightly  . ASTHMA     Albuterol prn and Advair daily;also takes Prednisone daily  . GERD        . DIABETES MELLITUS, TYPE II        . HYPERTENSION        . COPD (chronic obstructive pulmonary disease)   . O2 dependent     uses oxygen 2 and 1/2 liters per [place] at hs  . SLEEP APNEA, OBSTRUCTIVE     doesn't use CPAP  . Peripheral neuropathy   . Arthritis   . Chronic constipation     takes OTC stool softener  . Diverticulitis   . Glaucoma   . DEPRESSION     takes Zoloft and Doxepin daily  . Anemia     supposed to be taking Vit B but doesn't    Scheduled Meds:  Scheduled Meds: . albuterol  2.5 mg Inhalation QID  . ALPRAZolam  0.5 mg Oral QHS  . B-complex with vitamin  C  1 tablet Oral Daily  . carisoprodol  350 mg Oral QHS  . diltiazem  120 mg Oral Daily  . doxepin  25 mg Oral BID  . ferrous sulfate  325 mg Oral Daily  . furosemide  80 mg Intravenous BID  . insulin aspart  0-15 Units Subcutaneous TID WC  . mometasone-formoterol  2 puff Inhalation BID  . nebivolol  10 mg Oral Daily  . pantoprazole  40 mg Oral Daily  . potassium chloride  40 mEq Oral BID  . ranolazine  500 mg Oral BID  . sertraline  200 mg Oral Q supper  . sodium chloride  3 mL Intravenous Q12H  . tiZANidine  2 mg Oral QHS  . traZODone  50 mg Oral QHS  . Warfarin - Pharmacist Dosing Inpatient   Does not apply q1800  . zolpidem  10 mg Oral QHS   Continuous Infusions:  sodium chloride, acetaminophen, hydrOXYzine, ibuprofen, Linaclotide, loratadine, ondansetron (ZOFRAN) IV, polyethylene glycol, sodium chloride, witch hazel-glycerin    PHYSICAL EXAM Filed Vitals:   04/01/15 2102 04/02/15 0015 04/02/15 0836 04/02/15 0952  BP:  99/54  133/119  Pulse:  82    Temp:  98 F (36.7 C)    TempSrc:  Oral    Resp:  25    Height:      Weight:      SpO2: 98% 97% 98%     General appearance: morbidly obese Lungs: clear to auscultation bilaterally Heart: irregularly irregular rhythm Abdomen: soft, non-tender; bowel sounds normal; no masses,  no organomegaly Extremities: edema 3+ Skin: Skin color, texture, turgor normal. No rashes or lesions Neurologic: Grossly normal  TELEMETRY: Reviewed telemetry pt in AFib with RVR :    Intake/Output Summary (Last 24 hours) at 04/02/15 1014 Last data filed at 04/01/15 1300  Gross per 24 hour  Intake    120 ml  Output   2750 ml  Net  -2630 ml    LABS: Basic Metabolic Panel:  Recent Labs Lab 03/30/15 1424 03/30/15 1453 03/31/15 0258 03/31/15 0908 04/01/15 0251 04/02/15 0319  NA 138 138 135  --  138 138  K 4.3 4.3 4.0  --  4.4 4.6  CL 106 103 101  --  102 99*  CO2 24  --  27  --  27 31  GLUCOSE 107* 101* 109*  --  109* 108*  BUN  14 16 14   --  15 17  CREATININE 0.71 0.80 0.81  --  0.86 0.98  CALCIUM 8.8*  --  8.5*  --  8.5* 9.2  MG  --   --   --  1.8  --   --    Cardiac Enzymes:  Recent Labs  03/30/15 2100 03/31/15 0258 03/31/15 0908  TROPONINI <0.03 <0.03 <0.03   CBC:  Recent Labs Lab 03/30/15 1424 03/30/15 1453 03/31/15 0908 04/01/15 0251 04/02/15 0319  WBC 8.3  --  7.0  --   --   HGB 7.7* 9.5* 8.2* 7.8* 9.0*  HCT 26.5* 28.0* 28.7* 27.1* 31.2*  MCV 69.9*  --  69.5*  --   --   PLT 344  --  371  --   --    PROTIME:  Recent Labs  03/30/15 1424 04/01/15 1032 04/02/15 0319  LABPROT 35.7* 23.3* 20.8*  INR 3.69* 2.09* 1.79*   Liver Function Tests:  Recent Labs  04/01/15 0251  AST 32  ALT 55  ALKPHOS 72  BILITOT 0.8  PROT 5.8*  ALBUMIN 3.1*   No results for input(s): LIPASE, AMYLASE in the last 72 hours. BNP: BNP (last 3 results)  Recent Labs  03/30/15 1424  BNP 506.3*    ProBNP (last 3 results) No results for input(s): PROBNP in the last 8760 hours.  D-Dimer: No results for input(s): DDIMER in the last 72 hours. Hemoglobin A1C: No results for input(s): HGBA1C in the last 72 hours. Fasting Lipid Panel: No results for input(s): CHOL, HDL, LDLCALC, TRIG, CHOLHDL, LDLDIRECT in the last 72 hours. Thyroid Function Tests:  Recent Labs  04/01/15 0251  TSH 0.011*   Anemia Panel:  Recent Labs  04/01/15 0251  FOLATE 30.7  FERRITIN 10*  TIBC 434  IRON 16*       ASSESSMENT AND PLAN:  Principal Problem:   Syncope Active Problems:   Chronic diastolic heart failure   Controlled diabetes mellitus type II without complication   Morbid obesity   Essential hypertension   Anticoagulated on warfarin   Bundle branch block, right and extreme left anterior fascicular   Anemia   Abnormal TSH   Acute on chronic diastolic heart failure   Persistent atrial fibrillation  He  is thyrotoxic. We will begin him on methimazole 10 mg twice daily. In addition, we will continue to  use beta blockers to try to control the hyperthyroid state. It had been my hope that we will be able to restore sinus rhythm; this appears to be unlikely in the presence of a thyrotoxic state.  Control of heart rate will be our most important variable and tried to address his diastolic heart failure  He also has iron deficiency anemia which will need further evaluation; stool samples are pending and Iron replacement has been initiated.  Syncope with bifascular block may prompt pacing or ILR    Will observe over days   Signed, Virl Axe MD  04/02/2015

## 2015-04-03 LAB — BASIC METABOLIC PANEL
Anion gap: 9 (ref 5–15)
BUN: 17 mg/dL (ref 6–20)
CHLORIDE: 94 mmol/L — AB (ref 101–111)
CO2: 31 mmol/L (ref 22–32)
CREATININE: 1.01 mg/dL (ref 0.61–1.24)
Calcium: 9.1 mg/dL (ref 8.9–10.3)
GFR calc Af Amer: >60 mL/min (ref >60)
GFR calc non Af Amer: >60 mL/min (ref >60)
GLUCOSE: 97 mg/dL (ref 65–99)
Potassium: 4.6 mmol/L (ref 3.5–5.1)
SODIUM: 134 mmol/L — AB (ref 135–145)

## 2015-04-03 LAB — PROTIME-INR
INR: 1.99 — AB (ref 0.00–1.49)
Prothrombin Time: 22.5 seconds — ABNORMAL HIGH (ref 11.6–15.2)

## 2015-04-03 LAB — HEMOGLOBIN AND HEMATOCRIT, BLOOD
HEMATOCRIT: 29.9 % — AB (ref 39.0–52.0)
HEMOGLOBIN: 8.7 g/dL — AB (ref 13.0–17.0)

## 2015-04-03 LAB — GLUCOSE, CAPILLARY
GLUCOSE-CAPILLARY: 110 mg/dL — AB (ref 65–99)
Glucose-Capillary: 83 mg/dL (ref 65–99)
Glucose-Capillary: 94 mg/dL (ref 65–99)
Glucose-Capillary: 119 mg/dL — ABNORMAL HIGH (ref 65–99)

## 2015-04-03 MED ORDER — METHIMAZOLE 10 MG PO TABS
10.0000 mg | ORAL_TABLET | Freq: Two times a day (BID) | ORAL | Status: DC
  Administered 2015-04-03 – 2015-04-06 (×7): 10 mg via ORAL
  Filled 2015-04-03 (×9): qty 1

## 2015-04-03 MED ORDER — POLYETHYLENE GLYCOL 3350 17 G PO PACK
17.0000 g | PACK | Freq: Two times a day (BID) | ORAL | Status: DC
  Administered 2015-04-03 – 2015-04-06 (×5): 17 g via ORAL
  Filled 2015-04-03 (×4): qty 1

## 2015-04-03 MED ORDER — WARFARIN SODIUM 2.5 MG PO TABS
2.5000 mg | ORAL_TABLET | Freq: Once | ORAL | Status: AC
  Administered 2015-04-03: 2.5 mg via ORAL
  Filled 2015-04-03: qty 1

## 2015-04-03 MED ORDER — DOCUSATE SODIUM 100 MG PO CAPS
200.0000 mg | ORAL_CAPSULE | Freq: Every day | ORAL | Status: DC
  Administered 2015-04-03 – 2015-04-06 (×3): 200 mg via ORAL
  Filled 2015-04-03 (×4): qty 2

## 2015-04-03 MED ORDER — CARISOPRODOL 350 MG PO TABS
350.0000 mg | ORAL_TABLET | Freq: Three times a day (TID) | ORAL | Status: DC
  Administered 2015-04-03 – 2015-04-06 (×9): 350 mg via ORAL
  Filled 2015-04-03 (×9): qty 1

## 2015-04-03 NOTE — Progress Notes (Signed)
Patient Name: [name]      SUBJECTIVE:  60 year old with multiple medical problems including atrial fibrillation .  He been managed with amiodarone and recently underwent cardioversion which failed to hold. He then had recurrent syncope prompting this hospitalization.  He has known bifascicular block.  He has ( perhaps iatrogenic) hyperthyroidism. He has chronic anemia  He has an antecedent history of presumed tachycardia-induced cardiomyopathy Last echo 05/2013 demonstrated EF 60%, mild LVH, LA 58. TEE 02/2015 demonstrated EF 50-55%, mild to moderate MR, mild to moderate TR.   Myoview >> EF 30-45% ( afib) but no ischemia   Perfusion defect could be diaghragmatic attenuation ( note TEE nuc discordance)  Shortness of breath is better there is less swelling in his hands and peripheral edema is improved. He has some lightheadedness with standing Past Medical History  Diagnosis Date  . DYSKINESIA, ESOPHAGUS   . FIBROMYALGIA   . HYPERLIPIDEMIA     but unable to take any meds  . INSOMNIA     takes Ambien nightly  . Rash and other nonspecific skin eruption 04/12/2009    no cause found saw dermatologists x 2 and allergist  . Atrial fibrillation     takes Coumadin daily along with Pacerone  . Cardiomyopathy     Myoview with prior infarct- 2012; EF 25% TEE July 2013; EF normalized 2015  . Myocardial infarction     hx of MI  . ANXIETY     takes Xanax nightly  . ASTHMA     Albuterol prn and Advair daily;also takes Prednisone daily  . GERD        . DIABETES MELLITUS, TYPE II        . HYPERTENSION        . COPD (chronic obstructive pulmonary disease)   . O2 dependent     uses oxygen 2 and 1/2 liters per [place] at hs  . SLEEP APNEA, OBSTRUCTIVE     doesn't use CPAP  . Peripheral neuropathy   . Arthritis   . Chronic constipation     takes OTC stool softener  . Diverticulitis   . Glaucoma   . DEPRESSION     takes Zoloft and Doxepin daily  . Anemia     supposed to  be taking Vit B but doesn't    Scheduled Meds:  Scheduled Meds: . albuterol  2.5 mg Inhalation QID  . ALPRAZolam  0.5 mg Oral QHS  . B-complex with vitamin C  1 tablet Oral Daily  . carisoprodol  350 mg Oral QHS  . diltiazem  120 mg Oral Daily  . doxepin  25 mg Oral BID  . ferrous sulfate  325 mg Oral Daily  . furosemide  80 mg Intravenous BID  . insulin aspart  0-15 Units Subcutaneous TID WC  . mometasone-formoterol  2 puff Inhalation BID  . nebivolol  10 mg Oral Daily  . pantoprazole  40 mg Oral Daily  . potassium chloride  40 mEq Oral BID  . ranolazine  500 mg Oral BID  . sertraline  200 mg Oral Q supper  . sodium chloride  3 mL Intravenous Q12H  . tiZANidine  2 mg Oral QHS  . traZODone  50 mg Oral QHS  . warfarin  2.5 mg Oral ONCE-1800  . Warfarin - Pharmacist Dosing Inpatient   Does not apply q1800  . zolpidem  10 mg Oral QHS   Continuous Infusions:  sodium chloride, acetaminophen, hydrOXYzine, ibuprofen,  Linaclotide, loratadine, ondansetron (ZOFRAN) IV, polyethylene glycol, sodium chloride, witch hazel-glycerin    PHYSICAL EXAM Filed Vitals:   04/03/15 0043 04/03/15 0400 04/03/15 0826 04/03/15 0922  BP: 114/54 102/48  150/48  Pulse: 78 78    Temp:  98.4 F (36.9 C)    TempSrc:      Resp: 20 18    Height:      Weight:  309 lb 6.4 oz (140.343 kg)    SpO2: 98% 100% 94%     General appearance: morbidly obese Lungs: clear to auscultation bilaterally Heart: irregularly irregular rhythm Abdomen: soft, non-tender; bowel sounds normal; no masses,  no organomegaly Extremities: edema 1-2+ Skin: Skin color, texture, turgor normal. No rashes or lesions Neurologic: Grossly normal  TELEMETRY: Reviewed telemetry pt in AFib with RVR :    Intake/Output Summary (Last 24 hours) at 04/03/15 0949 Last data filed at 04/03/15 0859  Gross per 24 hour  Intake    803 ml  Output   3025 ml  Net  -2222 ml    LABS: Basic Metabolic Panel:  Recent Labs Lab 03/30/15 1424  03/30/15 1453 03/31/15 0258 03/31/15 0908 04/01/15 0251 04/02/15 0319  NA 138 138 135  --  138 138  K 4.3 4.3 4.0  --  4.4 4.6  CL 106 103 101  --  102 99*  CO2 24  --  27  --  27 31  GLUCOSE 107* 101* 109*  --  109* 108*  BUN 14 16 14   --  15 17  CREATININE 0.71 0.80 0.81  --  0.86 0.98  CALCIUM 8.8*  --  8.5*  --  8.5* 9.2  MG  --   --   --  1.8  --   --    Cardiac Enzymes: No results for input(s): CKTOTAL, CKMB, CKMBINDEX, TROPONINI in the last 72 hours. CBC:  Recent Labs Lab 03/30/15 1424 03/30/15 1453 03/31/15 0908 04/01/15 0251 04/02/15 0319 04/03/15 0529  WBC 8.3  --  7.0  --   --   --   HGB 7.7* 9.5* 8.2* 7.8* 9.0* 8.7*  HCT 26.5* 28.0* 28.7* 27.1* 31.2* 29.9*  MCV 69.9*  --  69.5*  --   --   --   PLT 344  --  371  --   --   --    PROTIME:  Recent Labs  04/01/15 1032 04/02/15 0319 04/03/15 0529  LABPROT 23.3* 20.8* 22.5*  INR 2.09* 1.79* 1.99*   Liver Function Tests:  Recent Labs  04/01/15 0251  AST 32  ALT 55  ALKPHOS 72  BILITOT 0.8  PROT 5.8*  ALBUMIN 3.1*   No results for input(s): LIPASE, AMYLASE in the last 72 hours. BNP: BNP (last 3 results)  Recent Labs  03/30/15 1424  BNP 506.3*    ProBNP (last 3 results) No results for input(s): PROBNP in the last 8760 hours.  D-Dimer: No results for input(s): DDIMER in the last 72 hours. Hemoglobin A1C: No results for input(s): HGBA1C in the last 72 hours. Fasting Lipid Panel: No results for input(s): CHOL, HDL, LDLCALC, TRIG, CHOLHDL, LDLDIRECT in the last 72 hours. Thyroid Function Tests:  Recent Labs  04/01/15 0251  TSH 0.011*   Anemia Panel:  Recent Labs  04/01/15 0251  FOLATE 30.7  FERRITIN 10*  TIBC 434  IRON 16*       ASSESSMENT AND PLAN:  Principal Problem:   Syncope Active Problems:   Chronic diastolic heart failure   Controlled diabetes  mellitus type II without complication   Morbid obesity   Essential hypertension   Anticoagulated on warfarin    Bundle branch block, right and extreme left anterior fascicular   Anemia   Abnormal TSH   Acute on chronic diastolic heart failure   Persistent atrial fibrillation  orthostatic lightheadedness  He is thyrotoxic. We will begin him on methimazole 10 mg twice daily forgot yeseteday and orders written today   . In addition, we will continue to use beta blockers to try to control the hyperthyroid state. It had been my hope that we will be able to restore sinus rhythm; this appears to be unlikely in the presence of a thyrotoxic state.  Control of heart rate will be our most important variable in trying to address his diastolic heart failure now a suggestion of some systolic component although I suspect that this is an inappropriate read on his Myoview related to a variable rates  Ventricular response improved  vigorous diuresis overnight we'll continue IV diuretics  He also has iron deficiency anemia which will need further evaluation; stool samples are pending and Iron replacement has been initiated.  Syncope with bifascular block may prompt pacing or ILR    Will observe over days in the hospital and is nothing evident with anticipate loop recorder insertion prior to discharge.  Signed, Virl Axe MD  04/03/2015

## 2015-04-03 NOTE — Progress Notes (Signed)
ANTICOAGULATION CONSULT NOTE - Follow Up Consult   Pharmacy Consult for coumadin Indication: atrial fibrillation  Allergies  Allergen Reactions  . Pravastatin Sodium Other (See Comments)    myalgia  . Simvastatin Other (See Comments)    severe leg cramps  . Statins Other (See Comments)    myalgia  . Tape Other (See Comments)    Skin Tears Use Paper Tape Only    Patient Measurements: Height: 5\' 10"  (177.8 cm) Weight: (!) 309 lb 6.4 oz (140.343 kg) IBW/kg (Calculated) : 73 Heparin Dosing Weight:   Vital Signs: Temp: 98.4 F (36.9 C) (06/19 0400) BP: 102/48 mmHg (06/19 0400) Pulse Rate: 78 (06/19 0400)  Labs:  Recent Labs  03/31/15 0908 04/01/15 0251 04/01/15 1032 04/02/15 0319 04/03/15 0529  HGB 8.2* 7.8*  --  9.0* 8.7*  HCT 28.7* 27.1*  --  31.2* 29.9*  PLT 371  --   --   --   --   LABPROT  --   --  23.3* 20.8* 22.5*  INR  --   --  2.09* 1.79* 1.99*  CREATININE  --  0.86  --  0.98  --   TROPONINI <0.03  --   --   --   --     Estimated Creatinine Clearance: 113.3 mL/min (by C-G formula based on Cr of 0.98).   Medical History: Past Medical History  Diagnosis Date  . DYSKINESIA, ESOPHAGUS   . FIBROMYALGIA   . HYPERLIPIDEMIA     but unable to take any meds  . INSOMNIA     takes Ambien nightly  . Rash and other nonspecific skin eruption 04/12/2009    no cause found saw dermatologists x 2 and allergist  . Atrial fibrillation     takes Coumadin daily along with Pacerone  . Cardiomyopathy     Myoview with prior infarct- 2012; EF 25% TEE July 2013; EF normalized 2015  . Myocardial infarction     hx of MI  . ANXIETY     takes Xanax nightly  . ASTHMA     Albuterol prn and Advair daily;also takes Prednisone daily  . GERD        . DIABETES MELLITUS, TYPE II        . HYPERTENSION        . COPD (chronic obstructive pulmonary disease)   . O2 dependent     uses oxygen 2 and 1/2 liters per [place] at hs  . SLEEP APNEA, OBSTRUCTIVE     doesn't use CPAP  .  Peripheral neuropathy   . Arthritis   . Chronic constipation     takes OTC stool softener  . Diverticulitis   . Glaucoma   . DEPRESSION     takes Zoloft and Doxepin daily  . Anemia     supposed to be taking Vit B but doesn't    Medications:  Scheduled:  . albuterol  2.5 mg Inhalation QID  . ALPRAZolam  0.5 mg Oral QHS  . B-complex with vitamin C  1 tablet Oral Daily  . carisoprodol  350 mg Oral QHS  . diltiazem  120 mg Oral Daily  . doxepin  25 mg Oral BID  . ferrous sulfate  325 mg Oral Daily  . furosemide  80 mg Intravenous BID  . insulin aspart  0-15 Units Subcutaneous TID WC  . mometasone-formoterol  2 puff Inhalation BID  . nebivolol  10 mg Oral Daily  . pantoprazole  40 mg Oral Daily  . potassium chloride  40 mEq Oral BID  . ranolazine  500 mg Oral BID  . sertraline  200 mg Oral Q supper  . sodium chloride  3 mL Intravenous Q12H  . tiZANidine  2 mg Oral QHS  . traZODone  50 mg Oral QHS  . Warfarin - Pharmacist Dosing Inpatient   Does not apply q1800  . zolpidem  10 mg Oral QHS   Infusions:    Assessment: 59 yo who was admitted for recurrent afib. He has been on coumadin for anticoagulation. The admission INR was 3.69 so coumadin was held from 6/15-6/16. He was on amio. It has been discontinued for now due to thyrotoxicosis. INR has trended up to 1.99 today. A hyperthyroid state is thought to increase catabolism of vitamin K clotting factors and can increase INR. Will dose conservatively till hyperthyroid state has resolved. H/H remains low but stable today.  PTA coumadin = 5mg  qday except 2.5mg  TTSun  Goal of Therapy:  INR 2-3 Monitor platelets by anticoagulation protocol: Yes   Plan:   Coumadin 2.5mg  PO x1 today.  Daily INR  Albertina Parr, PharmD., BCPS Clinical Pharmacist Pager [phone]

## 2015-04-03 NOTE — Progress Notes (Signed)
Per [facility] pharmacy that was called, pt was taking soma 350 mg TID .  Called for verification.

## 2015-04-04 DIAGNOSIS — R001 Bradycardia, unspecified: Secondary | ICD-10-CM

## 2015-04-04 LAB — GLUCOSE, CAPILLARY
GLUCOSE-CAPILLARY: 101 mg/dL — AB (ref 65–99)
Glucose-Capillary: 101 mg/dL — ABNORMAL HIGH (ref 65–99)
Glucose-Capillary: 119 mg/dL — ABNORMAL HIGH (ref 65–99)
Glucose-Capillary: 92 mg/dL (ref 65–99)

## 2015-04-04 LAB — BASIC METABOLIC PANEL
Anion gap: 9 (ref 5–15)
BUN: 23 mg/dL — ABNORMAL HIGH (ref 6–20)
CALCIUM: 9.1 mg/dL (ref 8.9–10.3)
CO2: 29 mmol/L (ref 22–32)
Chloride: 95 mmol/L — ABNORMAL LOW (ref 101–111)
Creatinine, Ser: 1.08 mg/dL (ref 0.61–1.24)
GFR calc Af Amer: >60 mL/min (ref >60)
GFR calc non Af Amer: >60 mL/min (ref >60)
GLUCOSE: 100 mg/dL — AB (ref 65–99)
Potassium: 5 mmol/L (ref 3.5–5.1)
Sodium: 133 mmol/L — ABNORMAL LOW (ref 135–145)

## 2015-04-04 LAB — PROTIME-INR
INR: 2.08 — AB (ref 0.00–1.49)
PROTHROMBIN TIME: 23.2 s — AB (ref 11.6–15.2)

## 2015-04-04 MED ORDER — POTASSIUM CHLORIDE CRYS ER 20 MEQ PO TBCR
40.0000 meq | EXTENDED_RELEASE_TABLET | Freq: Every day | ORAL | Status: DC
  Administered 2015-04-05 – 2015-04-06 (×2): 40 meq via ORAL
  Filled 2015-04-04 (×2): qty 2

## 2015-04-04 MED ORDER — WARFARIN SODIUM 5 MG PO TABS
5.0000 mg | ORAL_TABLET | Freq: Once | ORAL | Status: AC
  Administered 2015-04-04: 5 mg via ORAL
  Filled 2015-04-04: qty 1

## 2015-04-04 MED ORDER — ALBUTEROL SULFATE (2.5 MG/3ML) 0.083% IN NEBU: 2.5000 mg | INHALATION_SOLUTION | RESPIRATORY_TRACT | Status: DC | PRN

## 2015-04-04 MED ORDER — NEBIVOLOL HCL 5 MG PO TABS
5.0000 mg | ORAL_TABLET | Freq: Every day | ORAL | Status: DC
  Administered 2015-04-05: 5 mg via ORAL
  Filled 2015-04-04: qty 1

## 2015-04-04 MED ORDER — TORSEMIDE 20 MG PO TABS
40.0000 mg | ORAL_TABLET | Freq: Every day | ORAL | Status: DC
  Administered 2015-04-04 – 2015-04-06 (×3): 40 mg via ORAL
  Filled 2015-04-04 (×4): qty 2

## 2015-04-04 NOTE — Care Management Note (Addendum)
Case Management Note  Patient Details  Name: [name] MRN: [id number] Date of Birth: 18-May-1956  Subjective/Objective:  Pt admitted for syncope.                   Action/Plan: CM will do a benefits check for Tiksoyn for co pay cost. CM called [facility] on [address]. Will make pt aware of cost once completed.     1532 04-04-15  Tikosyn:178mcg bid: $71.48 at [facility]- no auth required  226mcg bid: $71.48 at [place]- no auth required  546mcg bid: $71.48 at [place]- no auth required  04-05-15 1140 CM did speak with pt in ref to possible Tikosyn and cost. Per pt he will not be able to afford Tikosyn at 71.48. [place] to f/u with pt. No further needs from CM at this time.   Expected Discharge Date:                  Expected Discharge Plan:  Home/Self Care  In-House Referral:     Discharge planning Services  CM Consult  Post Acute Care Choice:    Choice offered to:     DME Arranged:    DME Agency:     HH Arranged:    HH Agency:     Status of Service:  In process, will continue to follow  Medicare Important Message Given:  Yes Date Medicare IM Given:  04/01/15 Medicare IM give by:  Jacqlyn Krauss, RN,BSN  Date Additional Medicare IM Given:  04/04/15 Additional Medicare Important Message give by:  Jacqlyn Krauss, RN,BSN   If discussed at Long Length of Stay Meetings, dates discussed: 04-05-15   Additional Comments:  Bethena Roys, RN 04/04/2015, 12:45 PM

## 2015-04-04 NOTE — Progress Notes (Addendum)
59 year old with multiple medical problems including atrial fibrillation . He been managed with amiodarone and recently underwent cardioversion which failed to hold. He then had recurrent syncope prompting this hospitalization. He has known bifascicular block.  He has ( perhaps iatrogenic) hyperthyroidism. He has chronic anemia  He has an antecedent history of presumed tachycardia-induced cardiomyopathy Last echo 05/2013 demonstrated EF 60%, mild LVH, LA 58. TEE 02/2015 demonstrated EF 50-55%, mild to moderate MR, mild to moderate TR.   Myoview >> EF 30-45% ( afib) but no ischemia Perfusion defect could be diaghragmatic attenuation ( note TEE nuc discordance)  Shortness of breath is better there is less swelling in his hands and peripheral edema is improved. He has some lightheadedness with standing still   Subjective: Overall improved SOB with diuresis, but continues with his usual SOB.    Objective: Vital signs in last 24 hours: Temp:  [97.7 F (36.5 C)-98.4 F (36.9 C)] 97.7 F (36.5 C) (06/20 0523) Pulse Rate:  [41-90] 41 (06/20 0523) Resp:  [16-18] 18 (06/20 0523) BP: (97-116)/(56-81) 116/67 mmHg (06/20 0523) SpO2:  [92 %-97 %] 93 % (06/20 0932) FiO2 (%):  [21 %] 21 % (06/20 0932) Weight:  [305 lb 11.2 oz (138.665 kg)] 305 lb 11.2 oz (138.665 kg) (06/20 0523) Weight change: -3 lb 11.2 oz (-1.678 kg) Last BM Date: 04/02/15 Intake/Output from previous day: -2978 06/19 0701 - 06/20 0700 In: 923 [P.O.:920; I.V.:3] Out: 3901 [Urine:3900; Stool:1] Intake/Output this shift:    PE: General:Pleasant affect, NAD Skin:Warm and dry, brisk capillary refill HEENT:normocephalic, sclera clear, mucus membranes moist Heart:irreg irreg  without murmur, gallup, rub or click Lungs:clear to diminished without rales, rhonchi, or wheezes ZHG:DJMEQ, soft, non tender, + BS, do not palpate liver spleen or masses Ext:1+ lower ext edema Lt > Rt though much improved, 2+ pedal pulses, 2+  radial pulses Neuro:alert and oriented, MAE, follows commands, + facial symmetry Tele:  A fib brief pauses 1.8 sec , occ HR to 120s     Lab Results:  Recent Labs  04/02/15 0319 04/03/15 0529  HGB 9.0* 8.7*  HCT 31.2* 29.9*   BMET  Recent Labs  04/03/15 1119 04/04/15 0517  NA 134* 133*  K 4.6 5.0  CL 94* 95*  CO2 31 29  GLUCOSE 97 100*  BUN 17 23*  CREATININE 1.01 1.08  CALCIUM 9.1 9.1   No results for input(s): TROPONINI in the last 72 hours.  Invalid input(s): CK, MB  Lab Results  Component Value Date   CHOL 99 03/07/2015   HDL 30.10* 03/07/2015   LDLCALC 54 03/07/2015   LDLDIRECT 170.2 08/18/2013   TRIG 73.0 03/07/2015   CHOLHDL 3 03/07/2015   Lab Results  Component Value Date   HGBA1C 5.8 03/07/2015     Lab Results  Component Value Date   TSH 0.011* 04/01/2015      Studies/Results: ECHO: Study Conclusions  - Left ventricle: The cavity size was moderately dilated. Systolic function was mildly to moderately reduced. The estimated ejection fraction was in the range of 40% to 45%. Diffuse hypokinesis. - Mitral valve: Calcified annulus. There was mild regurgitation. - Left atrium: The atrium was severely dilated. - Right ventricle: The cavity size was moderately dilated. Wall thickness was normal. Systolic function was moderately reduced. - Right atrium: The atrium was severely dilated. - Pulmonary arteries: Systolic pressure was mildly increased. PA peak pressure: 38 mm Hg (S).  Impressions:  - When compared to prior, EF is reduced  MYOVIEW:  Medications: I have reviewed the patient's current medications. Scheduled Meds: . albuterol  2.5 mg Inhalation QID  . ALPRAZolam  0.5 mg Oral QHS  . B-complex with vitamin C  1 tablet Oral Daily  . carisoprodol  350 mg Oral TID  . diltiazem  120 mg Oral Daily  . docusate sodium  200 mg Oral Daily  . doxepin  25 mg Oral BID  . ferrous sulfate  325 mg Oral Daily  . furosemide  80 mg  Intravenous BID  . insulin aspart  0-15 Units Subcutaneous TID WC  . methimazole  10 mg Oral BID  . mometasone-formoterol  2 puff Inhalation BID  . nebivolol  10 mg Oral Daily  . pantoprazole  40 mg Oral Daily  . polyethylene glycol  17 g Oral BID  . potassium chloride  40 mEq Oral BID  . ranolazine  500 mg Oral BID  . sertraline  200 mg Oral Q supper  . sodium chloride  3 mL Intravenous Q12H  . tiZANidine  2 mg Oral QHS  . traZODone  50 mg Oral QHS  . Warfarin - Pharmacist Dosing Inpatient   Does not apply q1800  . zolpidem  10 mg Oral QHS   Continuous Infusions:  PRN Meds:.sodium chloride, acetaminophen, hydrOXYzine, ibuprofen, Linaclotide, loratadine, ondansetron (ZOFRAN) IV, polyethylene glycol, sodium chloride, witch hazel-glycerin  Assessment/Plan: Principal Problem:   Syncope- + orthostatic hypotension may have RVR with his a fib also has bifascular block.  Active Problems:   Controlled diabetes mellitus type II without complication- glucose 94- 119   Morbid obesity--Obesity limits our ability to maintain SR He is interested in AF nutrition program - will refer   Essential hypertension- controlled BP 97/56 to 116/67    Anticoagulated on warfarin- INR 2.08    Chronic diastolic heart failure   Bundle branch block, right and extreme left anterior fascicular   Anemia h/h 8.7/9.0 on 04/03/15 on iron replacement - was to see GI at [facility] for [facility]oscopy but has been postponed.     Abnormal TSH--hyperthyroidism- beginning Methimazole 10 mg BID    Acute on chronic diastolic heart failure (-27,062 since admit)  Wt down from 330 to 305 lbs, 25 lbs.)  On lasix 80 IV BID. Mild hyponatremia. rec'd IV lasix this AM, ? Change to po this pm vs in am, was on 80 mg po BID    Persistent atrial fibrillation- follow in a fib clinic- amiodarone has been stopped-last dose 03/31/15-- would consider alternative AAD therapy with Tikosyn after amiodarone washout if he feels badly with rate control or  cannot be rate controlled.     Sleep Apnea, has been intolerant of CPAP- Recommend repeat pulmonary evaluation to reconsider   Tachycardia with a fib at times plan for loop prior to discharge. On cardizem 376 and bystolic 10    Negative myoview EF 30-45% ( afib) but no ischemia Perfusion defect could be diaghragmatic attenuation ( note TEE nuc discordance)    Borderline Hyperkalemia will decrease K+ to once daily.       LOS: 5 days   Time spent with pt. :15 minutes. [facility] R  Nurse Practitioner Certified Pager [phone] or after 5pm and on weekends call [phone] 04/04/2015, 10:01 AM    HR low overnight. This afternoon it is  somewhat faster. When he stop his diltiazem and decrease his bisoprolol, however, his daytime rates remained quite rapid. This should hopefully abate as it methimazole this into the system; however, the washing of  his amiodarone will have a reverse effect.  His diuresis continues to be good ; we will change his IV--oral torsemide and see how he does.  Hopefully will have his heart rates reasonably controlled and we can discharge him on Wednesday. We would anticipate implanting a loop recorder on the day of discharge  His history of syncope is most consistent with orthostasis. No jugular orthostatic vital signs; however, unfortunately with his bifascicular block this does not preclude the need for ongoing monitoring.  This tachybradycardia syndrome I driving need pacing even during this hospitalization, thereby obviating the need for loop recorder. We will be looking at that decision over the next 24-48 hours. His heart rates in the last 24 hours have ranged from 40---140

## 2015-04-04 NOTE — Progress Notes (Signed)
ANTICOAGULATION CONSULT NOTE - Follow Up Consult  Pharmacy Consult for Coumadin Indication: atrial fibrillation  Allergies  Allergen Reactions  . Pravastatin Sodium Other (See Comments)    myalgia  . Simvastatin Other (See Comments)    severe leg cramps  . Statins Other (See Comments)    myalgia  . Tape Other (See Comments)    Skin Tears Use Paper Tape Only    Patient Measurements: Height: 5\' 10"  (177.8 cm) Weight: (!) 305 lb 11.2 oz (138.665 kg) IBW/kg (Calculated) : 73 Heparin Dosing Weight:    Vital Signs: Temp: 97.7 F (36.5 C) (06/20 0523) Temp Source: Oral (06/20 0523) BP: 116/67 mmHg (06/20 0523) Pulse Rate: 41 (06/20 0523)  Labs:  Recent Labs  04/02/15 0319 04/03/15 0529 04/03/15 1119 04/04/15 0517  HGB 9.0* 8.7*  --   --   HCT 31.2* 29.9*  --   --   LABPROT 20.8* 22.5*  --  23.2*  INR 1.79* 1.99*  --  2.08*  CREATININE 0.98  --  1.01 1.08    Estimated Creatinine Clearance: 102.2 mL/min (by C-G formula based on Cr of 1.08).  Assessment: 59 yo who was admitted for recurrent afib. He has been on coumadin for anticoagulation. The admission INR was 3.69 so coumadin was held. Home amiodarone d/c'd.   Anticoagulation: Admit INR 3.69 (on amio). INR 2.08. Home Coumadin dose: 5 mg on Mon/Wed/Fri/Sat; 2.5 mg on other days   Goal of Therapy:  INR 2-3 Monitor platelets by anticoagulation protocol: Yes   Plan:  Coumadin 5mg  po x 1 tonight Daily INR   [name], PharmD, BCPS Clinical Staff Pharmacist Pager [phone]  [name] 04/04/2015,11:58 AM

## 2015-04-04 NOTE — Discharge Instructions (Signed)
Information on my medicine - Coumadin®   (Warfarin) ° °This medication education was reviewed with me or my healthcare representative as part of my discharge preparation.  The pharmacist that spoke with me during my hospital stay was:  Bostyn Kunkler Stillinger, RPH ° °Why was Coumadin prescribed for you? °Coumadin was prescribed for you because you have a blood clot or a medical condition that can cause an increased risk of forming blood clots. Blood clots can cause serious health problems by blocking the flow of blood to the heart, lung, or brain. Coumadin can prevent harmful blood clots from forming. °As a reminder your indication for Coumadin is:   Stroke Prevention Because Of Atrial Fibrillation ° °What test will check on my response to Coumadin? °While on Coumadin (warfarin) you will need to have an INR test regularly to ensure that your dose is keeping you in the desired range. The INR (international normalized ratio) number is calculated from the result of the laboratory test called prothrombin time (PT). ° °If an INR APPOINTMENT HAS NOT ALREADY BEEN MADE FOR YOU please schedule an appointment to have this lab work done by your health care provider within 7 days. °Your INR goal is usually a number between:  2 to 3 or your provider may give you a more narrow range like 2-2.5.  Ask your health care provider during an office visit what your goal INR is. ° °What  do you need to  know  About  COUMADIN? °Take Coumadin (warfarin) exactly as prescribed by your healthcare provider about the same time each day.  DO NOT stop taking without talking to the doctor who prescribed the medication.  Stopping without other blood clot prevention medication to take the place of Coumadin may increase your risk of developing a new clot or stroke.  Get refills before you run out. ° °What do you do if you miss a dose? °If you miss a dose, take it as soon as you remember on the same day then continue your regularly scheduled  regimen the next day.  Do not take two doses of Coumadin at the same time. ° °Important Safety Information °A possible side effect of Coumadin (Warfarin) is an increased risk of bleeding. You should call your healthcare provider right away if you experience any of the following: °  Bleeding from an injury or your nose that does not stop. °  Unusual colored urine (red or dark brown) or unusual colored stools (red or black). °  Unusual bruising for unknown reasons. °  A serious fall or if you hit your head (even if there is no bleeding). ° °Some foods or medicines interact with Coumadin® (warfarin) and might alter your response to warfarin. To help avoid this: °  Eat a balanced diet, maintaining a consistent amount of Vitamin K. °  Notify your provider about major diet changes you plan to make. °  Avoid alcohol or limit your intake to 1 drink for women and 2 drinks for men per day. °(1 drink is 5 oz. wine, 12 oz. beer, or 1.5 oz. liquor.) ° °Make sure that ANY health care provider who prescribes medication for you knows that you are taking Coumadin (warfarin).  Also make sure the healthcare provider who is monitoring your Coumadin knows when you have started a new medication including herbals and non-prescription products. ° °Coumadin® (Warfarin)  Major Drug Interactions  °Increased Warfarin Effect Decreased Warfarin Effect  °Alcohol (large quantities) °Antibiotics (esp. Septra/Bactrim, Flagyl, Cipro) °Amiodarone (Cordarone) °Aspirin (  ASA) °Cimetidine (Tagamet) °Megestrol (Megace) °NSAIDs (ibuprofen, naproxen, etc.) °Piroxicam (Feldene) °Propafenone (Rythmol SR) °Propranolol (Inderal) °Isoniazid (INH) °Posaconazole (Noxafil) Barbiturates (Phenobarbital) °Carbamazepine (Tegretol) °Chlordiazepoxide (Librium) °Cholestyramine (Questran) °Griseofulvin °Oral Contraceptives °Rifampin °Sucralfate (Carafate) °Vitamin K  ° °Coumadin® (Warfarin) Major Herbal Interactions  °Increased Warfarin Effect Decreased Warfarin Effect    °Garlic °Ginseng °Ginkgo biloba Coenzyme Q10 °Green tea °St. John’s wort   ° °Coumadin® (Warfarin) FOOD Interactions  °Eat a consistent number of servings per week of foods HIGH in Vitamin K °(1 serving = ½ cup)  °Collards (cooked, or boiled & drained) °Kale (cooked, or boiled & drained) °Mustard greens (cooked, or boiled & drained) °Parsley *serving size only = ¼ cup °Spinach (cooked, or boiled & drained) °Swiss chard (cooked, or boiled & drained) °Turnip greens (cooked, or boiled & drained)  °Eat a consistent number of servings per week of foods MEDIUM-HIGH in Vitamin K °(1 serving = 1 cup)  °Asparagus (cooked, or boiled & drained) °Broccoli (cooked, boiled & drained, or raw & chopped) °Brussel sprouts (cooked, or boiled & drained) *serving size only = ½ cup °Lettuce, raw (green leaf, endive, romaine) °Spinach, raw °Turnip greens, raw & chopped  ° °These websites have more information on Coumadin (warfarin):  www.coumadin.com; °www.ahrq.gov/consumer/coumadin.htm; ° ° ° °

## 2015-04-05 ENCOUNTER — Ambulatory Visit: Payer: Commercial Managed Care - HMO

## 2015-04-05 LAB — BASIC METABOLIC PANEL
Anion gap: 8 (ref 5–15)
BUN: 29 mg/dL — ABNORMAL HIGH (ref 6–20)
CHLORIDE: 92 mmol/L — AB (ref 101–111)
CO2: 31 mmol/L (ref 22–32)
Calcium: 8.7 mg/dL — ABNORMAL LOW (ref 8.9–10.3)
Creatinine, Ser: 1.09 mg/dL (ref 0.61–1.24)
GFR calc non Af Amer: >60 mL/min (ref >60)
Glucose, Bld: 100 mg/dL — ABNORMAL HIGH (ref 65–99)
Potassium: 4.5 mmol/L (ref 3.5–5.1)
Sodium: 131 mmol/L — ABNORMAL LOW (ref 135–145)

## 2015-04-05 LAB — GLUCOSE, CAPILLARY
Glucose-Capillary: 92 mg/dL (ref 65–99)
Glucose-Capillary: 101 mg/dL — ABNORMAL HIGH (ref 65–99)
Glucose-Capillary: 107 mg/dL — ABNORMAL HIGH (ref 65–99)
Glucose-Capillary: 120 mg/dL — ABNORMAL HIGH (ref 65–99)

## 2015-04-05 LAB — CBC
HCT: 30.4 % — ABNORMAL LOW (ref 39.0–52.0)
Hemoglobin: 8.8 g/dL — ABNORMAL LOW (ref 13.0–17.0)
MCH: 20 pg — ABNORMAL LOW (ref 26.0–34.0)
MCHC: 28.9 g/dL — AB (ref 30.0–36.0)
MCV: 69.1 fL — ABNORMAL LOW (ref 78.0–100.0)
PLATELETS: 381 10*3/uL (ref 150–400)
RBC: 4.4 MIL/uL (ref 4.22–5.81)
RDW: 21.5 % — ABNORMAL HIGH (ref 11.5–15.5)
WBC: 8.4 10*3/uL (ref 4.0–10.5)

## 2015-04-05 LAB — PROTIME-INR
INR: 2.33 — AB (ref 0.00–1.49)
PROTHROMBIN TIME: 25.3 s — AB (ref 11.6–15.2)

## 2015-04-05 MED ORDER — PANTOPRAZOLE SODIUM 40 MG PO TBEC
40.0000 mg | DELAYED_RELEASE_TABLET | Freq: Two times a day (BID) | ORAL | Status: DC
  Administered 2015-04-05 – 2015-04-06 (×2): 40 mg via ORAL
  Filled 2015-04-05 (×2): qty 1

## 2015-04-05 MED ORDER — ALUM & MAG HYDROXIDE-SIMETH 200-200-20 MG/5ML PO SUSP
30.0000 mL | Freq: Four times a day (QID) | ORAL | Status: DC | PRN
  Administered 2015-04-05: 30 mL via ORAL
  Filled 2015-04-05: qty 30

## 2015-04-05 MED ORDER — WARFARIN SODIUM 5 MG PO TABS
5.0000 mg | ORAL_TABLET | Freq: Once | ORAL | Status: AC
  Administered 2015-04-05: 5 mg via ORAL
  Filled 2015-04-05: qty 1

## 2015-04-05 MED ORDER — NEBIVOLOL HCL 5 MG PO TABS
7.5000 mg | ORAL_TABLET | Freq: Every day | ORAL | Status: DC
  Administered 2015-04-06: 7.5 mg via ORAL
  Filled 2015-04-05: qty 2

## 2015-04-05 NOTE — Progress Notes (Signed)
ANTICOAGULATION CONSULT NOTE - Follow Up Consult  Pharmacy Consult for Coumadin Indication: atrial fibrillation  Allergies  Allergen Reactions  . Pravastatin Sodium Other (See Comments)    myalgia  . Simvastatin Other (See Comments)    severe leg cramps  . Statins Other (See Comments)    myalgia  . Tape Other (See Comments)    Skin Tears Use Paper Tape Only    Patient Measurements: Height: 5\' 10"  (177.8 cm) Weight: (!) 302 lb 6.4 oz (137.168 kg) IBW/kg (Calculated) : 73 Heparin Dosing Weight:    Vital Signs: Temp: 98 F (36.7 C) (06/21 0723) Temp Source: Oral (06/21 0723) BP: 101/49 mmHg (06/21 0723) Pulse Rate: 98 (06/21 0812)  Labs:  Recent Labs  04/03/15 0529 04/03/15 1119 04/04/15 0517 04/05/15 0446  HGB 8.7*  --   --  8.8*  HCT 29.9*  --   --  30.4*  PLT  --   --   --  381  LABPROT 22.5*  --  23.2* 25.3*  INR 1.99*  --  2.08* 2.33*  CREATININE  --  1.01 1.08 1.09    Estimated Creatinine Clearance: 100.6 mL/min (by C-G formula based on Cr of 1.09).  Assessment: 59 yo who was admitted for recurrent afib, syncope (fell down the steps). He has been on coumadin for anticoagulation. The admission INR was 3.69 so coumadin was held. Home amiodarone d/c'd.   Anticoagulation: Admit INR 3.69 (on amio). INR 2.33. Hgb low but stable 8.8. Home Coumadin dose: 5 mg on Mon/Wed/Fri/Sat; 2.5 mg TThSun  Goal of Therapy:  INR 2-3 Monitor platelets by anticoagulation protocol: Yes   Plan:  Coumadin 5mg  po x 1 again tonight Daily INR   Toneisha Savary S. Alford Highland, PharmD, BCPS Clinical Staff Pharmacist Pager [phone]  [name] 04/05/2015,9:42 AM

## 2015-04-05 NOTE — Consult Note (Signed)
   [facility] CM Inpatient Consult   04/05/2015  [name] 1956-01-26 [id number] Patient evaluated for community based chronic disease management services with [place] [facility] as a benefit of patient's [employer]/Medicare Insurance. Spoke with patient at bedside to explain [facility] [place] [facility] services. Consent form signed.  Patient will receive post discharge calls and will be evaluated for monthly home visits for assessments and disease process education.  Left contact information and [facility] literature at bedside. Made Inpatient Case Manager aware that [place] [facility] following. Of note, [facility] Care Management services does not replace or interfere with any services that are arranged by inpatient case management or social work.  For additional questions or referrals please contact:   Natividad Brood, RN BSN [facility] Liaison  [phone] business mobile phone

## 2015-04-05 NOTE — Progress Notes (Signed)
Patient Name: [name] Date of Encounter: 04/05/2015  Profile: 59 year old with multiple medical problems including atrial fibrillation . He been managed with amiodarone and recently underwent cardioversion which failed to hold. He then had recurrent syncope prompting this hospitalization. He has known bifascicular block.  He has ( perhaps iatrogenic) hyperthyroidism. He has chronic anemia  He has an antecedent history of presumed tachycardia-induced cardiomyopathy Last echo 05/2013 demonstrated EF 60%, mild LVH, LA 58. TEE 02/2015 demonstrated EF 50-55%, mild to moderate MR, mild to moderate TR.   Myoview >> EF 30-45% ( afib) but no ischemia Perfusion defect could be diaghragmatic attenuation ( note TEE nuc discordance)  Shortness of breath is better there is less swelling in his hands and peripheral edema is improved. He has some lightheadedness with standing still.  SUBJECTIVE  SOB with activity, improved. Dizzy with standing.   CURRENT MEDS . ALPRAZolam  0.5 mg Oral QHS  . B-complex with vitamin C  1 tablet Oral Daily  . carisoprodol  350 mg Oral TID  . docusate sodium  200 mg Oral Daily  . doxepin  25 mg Oral BID  . ferrous sulfate  325 mg Oral Daily  . insulin aspart  0-15 Units Subcutaneous TID WC  . methimazole  10 mg Oral BID  . mometasone-formoterol  2 puff Inhalation BID  . nebivolol  5 mg Oral Daily  . pantoprazole  40 mg Oral Daily  . polyethylene glycol  17 g Oral BID  . potassium chloride  40 mEq Oral Daily  . ranolazine  500 mg Oral BID  . sertraline  200 mg Oral Q supper  . sodium chloride  3 mL Intravenous Q12H  . tiZANidine  2 mg Oral QHS  . torsemide  40 mg Oral Daily  . traZODone  50 mg Oral QHS  . Warfarin - Pharmacist Dosing Inpatient   Does not apply q1800  . zolpidem  10 mg Oral QHS    OBJECTIVE  Filed Vitals:   04/05/15 0000 04/05/15 0649 04/05/15 0723 04/05/15 0812  BP: 99/70 132/95 101/49   Pulse: 52 83 82 98  Temp: 98.2 F (36.8  C) 98.2 F (36.8 C) 98 F (36.7 C)   TempSrc: Oral Oral Oral   Resp:    18  Height:  5\' 10"  (1.778 m)    Weight:  302 lb 6.4 oz (137.168 kg)    SpO2: 100% 99% 92% 96%    Intake/Output Summary (Last 24 hours) at 04/05/15 0833 Last data filed at 04/05/15 0823  Gross per 24 hour  Intake   1140 ml  Output   2700 ml  Net  -1560 ml   Filed Weights   04/03/15 0400 04/04/15 0523 04/05/15 0649  Weight: 309 lb 6.4 oz (140.343 kg) 305 lb 11.2 oz (138.665 kg) 302 lb 6.4 oz (137.168 kg)    PHYSICAL EXAM  General: Pleasant, NAD. Neuro: Alert and oriented X 3. Moves all extremities spontaneously. Psych: Normal affect. HEENT:  Normal  Neck: Supple without bruits or JVD. Lungs:  Resp regular and unlabored, CTA. Heart: RRR no s3, s4, or murmurs. Abdomen: Soft, non-tender, non-distended, BS + x 4.  Extremities: No clubbing, cyanosis.  DP/PT/Radials 2+ and equal bilaterally. 1+ LE edema.   Accessory Clinical Findings  CBC  Recent Labs  04/03/15 0529 04/05/15 0446  WBC  --  8.4  HGB 8.7* 8.8*  HCT 29.9* 30.4*  MCV  --  69.1*  PLT  --  381  Basic Metabolic Panel  Recent Labs  04/04/15 0517 04/05/15 0446  NA 133* 131*  K 5.0 4.5  CL 95* 92*  CO2 29 31  GLUCOSE 100* 100*  BUN 23* 29*  CREATININE 1.08 1.09  CALCIUM 9.1 8.7*   TELE  Afib at rate of 60-90s. Few pause, longest 2.08. Transient bradycardia to 40s.   Radiology/Studies  Dg Chest 2 View  03/30/2015   CLINICAL DATA:  Chest pain and right shoulder pain after falling down steps coming out of a trailer today.  EXAM: CHEST  2 VIEW  COMPARISON:  01/26/2014  FINDINGS: There is new cardiomegaly with a new small right pleural effusion. There is slight pulmonary vascular congestion. No infiltrates. No acute osseous abnormality.  IMPRESSION: New cardiomegaly with pulmonary vascular congestion and a new small right pleural effusion. Findings consistent with mild congestive heart failure.   Electronically Signed   By: Lorriane Shire M.D.   On: 03/30/2015 15:24   Dg Shoulder Right  03/30/2015   CLINICAL DATA:  59 year old male with right shoulder pain after falling down several steps today. Decreased range of motion.  EXAM: RIGHT SHOULDER - 2+ VIEW  COMPARISON:  Concurrently obtained chest x-ray.  FINDINGS: No acute fracture or malalignment. Degenerative osteoarthritis present at the acromioclavicular joint. Clavicle and scapula appear intact. Visualized thorax demonstrates no evidence of acute rib fracture. Normal bony mineralization. There is a small right-sided pleural effusion.  IMPRESSION: 1. No acute fracture or malalignment. 2. AC joint osteoarthritis. 3. Small right pleural effusion.   Electronically Signed   By: Jacqulynn Cadet M.D.   On: 03/30/2015 15:23   Nm Myocar Multi W/spect W/wall Motion / Ef  04/01/2015    There was no ST segment deviation noted during stress.  Findings consistent with prior myocardial infarction.  This is a high risk study.  The left ventricular ejection fraction is moderately decreased (30-44%).   Small area of inferior wall infarct from apex to base no ischemia.   EF 37% with diffuse hypokinesis but suggest echo correlation As underlying rhythm afib with RBBB and surface EF may not be accurate High Risk by nature of depressed EF    ASSESSMENT AND PLAN  1.Syncope:  + orthostatic hypotension  - Likely due to orthostasis. Still symptomatic. Has bifascular block.  Per Dr [name], syncope is felt to be due to orthostasis. Dr [name] would like implantable loop recorder prior to discharge.  2. Persistent atrial fibrillation - follow in a fib clinic- amiodarone has been stopped-last dose 03/31/15 - Stopped diltiazem and decreased Bystolic yesterday due to low HR. Rate improved. Anticipate implanting a loop recorder on the day discharge or even pacing for tachybradycardia syndrome per Dr. [name].  - Continue warfarin per pharmacy  3. Acute on chronic diastolic heart failure  - Net I/O  negative 15.7 L since adimission.  Wt down from 330 to 302 lbs, 28 lbs.  - Mild hyponatremia.  - changed IV Lasix to torsemide po yesterday - Will place on support stocking  4. Atypical chest pain - Improved, however still present.  - Continue Ranexa 500mg .  - EF was 50-55% on TEE 03/15/15 and successful cardioversion.  - Myoview 03/31/15 Small area of inferior wall infarct from apex to base no ischemia. EF 37% with diffuse hypokinesis but suggest echo correlation. As underlying rhythm afib with RBBB and surface EF may not be accurate. High Risk by nature of depressed EF. - LV EF of 40-45% on Echo 04/01/15, reduce from prior. Diffuse  hypokinesis.    5. HTN - Relatively stable  6. Controlled diabetes mellitus type II  - continue SSI  7. Anticoagulated on warfarin - INR 2.33, per pharmacy  8. Anemia  - heamoglobin 8.8 today, on iron replacement - was to see GI at [facility] for colonoscopy but has been postponed.   9.  Abnormal TSH--hyperthyroidism -  Methimazole 10 mg BID, likely from amido therapy  10. Sleep Apnea -has been intolerant of CPAP- Recommend repeat pulmonary evaluation to reconsider   Signed, Bhagat,Bhavinkumar PA-C Pager [phone]  I have seen, examined the patient, and reviewed the above assessment and plan.  Morbidly obese gentleman with medicine refractory afib and hyperthyroidism in the setting of amiodarone.rone.  I think that our ability to maintain sinus is very limited.  Would recommend rate control long term.  Could consider ILR placement for afib management/ syncope prior to discharge. As V rates are now better controlled, will anticipate discharge tomorrow.  Will increase bystolic today to 7.5mg  daily.   Co Sign: Thompson Grayer, MD 04/05/2015 4:02 PM

## 2015-04-06 ENCOUNTER — Encounter (HOSPITAL_COMMUNITY): Payer: Self-pay | Admitting: Internal Medicine

## 2015-04-06 ENCOUNTER — Encounter: Payer: Self-pay | Admitting: Internal Medicine

## 2015-04-06 ENCOUNTER — Encounter (HOSPITAL_COMMUNITY)
Admission: EM | Disposition: A | Discharge status: Home / Self Care | Payer: Self-pay | Source: Home / Self Care | Attending: Cardiology

## 2015-04-06 DIAGNOSIS — R55 Syncope and collapse: Secondary | ICD-10-CM

## 2015-04-06 DIAGNOSIS — E0580 Other thyrotoxicosis without thyrotoxic crisis or storm: Secondary | ICD-10-CM

## 2015-04-06 HISTORY — PX: EP IMPLANTABLE DEVICE: SHX172B

## 2015-04-06 LAB — BASIC METABOLIC PANEL
Anion gap: 9 (ref 5–15)
BUN: 33 mg/dL — ABNORMAL HIGH (ref 6–20)
CALCIUM: 9 mg/dL (ref 8.9–10.3)
CHLORIDE: 92 mmol/L — AB (ref 101–111)
CO2: 33 mmol/L — AB (ref 22–32)
CREATININE: 1.19 mg/dL (ref 0.61–1.24)
GFR calc Af Amer: >60 mL/min (ref >60)
GFR calc non Af Amer: >60 mL/min (ref >60)
Glucose, Bld: 108 mg/dL — ABNORMAL HIGH (ref 65–99)
Potassium: 4.9 mmol/L (ref 3.5–5.1)
SODIUM: 134 mmol/L — AB (ref 135–145)

## 2015-04-06 LAB — PROTIME-INR
INR: 2.71 — ABNORMAL HIGH (ref 0.00–1.49)
Prothrombin Time: 28.3 seconds — ABNORMAL HIGH (ref 11.6–15.2)

## 2015-04-06 LAB — GLUCOSE, CAPILLARY: GLUCOSE-CAPILLARY: 97 mg/dL (ref 65–99)

## 2015-04-06 SURGERY — LOOP RECORDER INSERTION

## 2015-04-06 MED ORDER — WARFARIN SODIUM 2.5 MG PO TABS: 2.5000 mg | ORAL_TABLET | Freq: Once | ORAL | Status: DC

## 2015-04-06 MED ORDER — LIDOCAINE-EPINEPHRINE 1 %-1:100000 IJ SOLN
INTRAMUSCULAR | Status: AC
  Filled 2015-04-06: qty 1

## 2015-04-06 MED ORDER — ONDANSETRON HCL 4 MG/2ML IJ SOLN: 4.0000 mg | Freq: Four times a day (QID) | INTRAMUSCULAR | Status: DC | PRN

## 2015-04-06 MED ORDER — POTASSIUM CHLORIDE CRYS ER 20 MEQ PO TBCR: 40.0000 meq | EXTENDED_RELEASE_TABLET | Freq: Every day | ORAL | Status: DC

## 2015-04-06 MED ORDER — NEBIVOLOL HCL 2.5 MG PO TABS
7.5000 mg | ORAL_TABLET | Freq: Every day | ORAL | Status: DC
Start: 2015-04-06 — End: 2015-04-13

## 2015-04-06 MED ORDER — TORSEMIDE 20 MG PO TABS: 40.0000 mg | ORAL_TABLET | Freq: Two times a day (BID) | ORAL | Status: DC

## 2015-04-06 MED ORDER — LIDOCAINE-EPINEPHRINE 1 %-1:100000 IJ SOLN
INTRAMUSCULAR | Status: DC | PRN
  Administered 2015-04-06: 20 mL

## 2015-04-06 MED ORDER — METHIMAZOLE 10 MG PO TABS: 10.0000 mg | ORAL_TABLET | Freq: Two times a day (BID) | ORAL | Status: DC

## 2015-04-06 MED ORDER — ACETAMINOPHEN 325 MG PO TABS: 325.0000 mg | ORAL_TABLET | ORAL | Status: DC | PRN

## 2015-04-06 SURGICAL SUPPLY — 2 items
LOOP REVEAL LINQSYS (Prosthesis & Implant Heart) ×1 IMPLANT
PACK LOOP INSERTION (CUSTOM PROCEDURE TRAY) ×2 IMPLANT

## 2015-04-06 NOTE — Patient Outreach (Signed)
[facility]) Care Management  04/06/2015  [name] 1956/08/23 [id number]   Referral from Natividad Brood, RN, assigned Tomasa Rand, RN.  Consent signed, saved in chart under media tab.  [name]. [place], [place] [facility] [place] Assistant Phone: [phone] Fax: [phone]

## 2015-04-06 NOTE — Progress Notes (Signed)
Dressing over loop recorder site had small amount of drainage. Notified Chanetta Marshall, NP. No new orders at this time. Will continue to monitor.

## 2015-04-06 NOTE — Interval H&P Note (Signed)
History and Physical Interval Note:  04/06/2015 7:15 AM  [name]  has presented today for surgery, with the diagnosis of syncope  The various methods of treatment have been discussed with the patient and family. After consideration of risks, benefits and other options for treatment, the patient has consented to  Procedure(s): Loop Recorder Insertion (N/A) as a surgical intervention .  The patient's history has been reviewed, patient examined, no change in status, stable for surgery.  I have reviewed the patient's chart and labs.  Questions were answered to the patient's satisfaction.     Cristopher Peru

## 2015-04-06 NOTE — H&P (Signed)
Patient Name: [name] Date of Encounter: 04/06/2015     Principal Problem:   Syncope Active Problems:   Controlled diabetes mellitus type II without complication   Morbid obesity   Essential hypertension   Anticoagulated on warfarin   Chronic diastolic heart failure   Bundle branch block, right and extreme left anterior fascicular   Anemia   Abnormal TSH   Acute on chronic diastolic heart failure   Persistent atrial fibrillation    SUBJECTIVE  No chest pain or sob.   CURRENT MEDS . ALPRAZolam  0.5 mg Oral QHS  . B-complex with vitamin C  1 tablet Oral Daily  . carisoprodol  350 mg Oral TID  . docusate sodium  200 mg Oral Daily  . doxepin  25 mg Oral BID  . ferrous sulfate  325 mg Oral Daily  . insulin aspart  0-15 Units Subcutaneous TID WC  . methimazole  10 mg Oral BID  . mometasone-formoterol  2 puff Inhalation BID  . nebivolol  7.5 mg Oral Daily  . pantoprazole  40 mg Oral BID  . polyethylene glycol  17 g Oral BID  . potassium chloride  40 mEq Oral Daily  . ranolazine  500 mg Oral BID  . sertraline  200 mg Oral Q supper  . sodium chloride  3 mL Intravenous Q12H  . tiZANidine  2 mg Oral QHS  . torsemide  40 mg Oral Daily  . traZODone  50 mg Oral QHS  . Warfarin - Pharmacist Dosing Inpatient   Does not apply q1800  . zolpidem  10 mg Oral QHS    OBJECTIVE  Filed Vitals:   04/05/15 2023 04/05/15 2330 04/05/15 2353 04/06/15 0400  BP:  78/35 83/41 116/76  Pulse:   86 82  Temp:   98.2 F (36.8 C) 98.5 F (36.9 C)  TempSrc:   Oral Oral  Resp:   19 18  Height:      Weight:    302 lb 8 oz (137.213 kg)  SpO2: 98%  96% 100%    Intake/Output Summary (Last 24 hours) at 04/06/15 0711 Last data filed at 04/05/15 2132  Gross per 24 hour  Intake    340 ml  Output    250 ml  Net     90 ml   Filed Weights   04/04/15 0523 04/05/15 0649 04/06/15 0400  Weight: 305 lb 11.2 oz (138.665 kg) 302 lb 6.4 oz (137.168 kg) 302 lb 8 oz (137.213 kg)     PHYSICAL EXAM  General: Pleasant, NAD. Neuro: Alert and oriented X 3. Moves all extremities spontaneously. Psych: Normal affect. HEENT:  Normal  Neck: Supple without bruits or JVD. Lungs:  Resp regular and unlabored, CTA. Heart: IRRR no s3, s4, or murmurs. Abdomen: Soft, non-tender, non-distended, BS + x 4.  Extremities: No clubbing, cyanosis or edema. DP/PT/Radials 2+ and equal bilaterally.  Accessory Clinical Findings  CBC  Recent Labs  04/05/15 0446  WBC 8.4  HGB 8.8*  HCT 30.4*  MCV 69.1*  PLT 448   Basic Metabolic Panel  Recent Labs  04/05/15 0446 04/06/15 0318  NA 131* 134*  K 4.5 4.9  CL 92* 92*  CO2 31 33*  GLUCOSE 100* 108*  BUN 29* 33*  CREATININE 1.09 1.19  CALCIUM 8.7* 9.0   Liver Function Tests No results for input(s): AST, ALT, ALKPHOS, BILITOT, PROT, ALBUMIN in the last 72 hours. No results for input(s): LIPASE, AMYLASE in the last 72  hours. Cardiac Enzymes No results for input(s): CKTOTAL, CKMB, CKMBINDEX, TROPONINI in the last 72 hours. BNP Invalid input(s): POCBNP D-Dimer No results for input(s): DDIMER in the last 72 hours. Hemoglobin A1C No results for input(s): HGBA1C in the last 72 hours. Fasting Lipid Panel No results for input(s): CHOL, HDL, LDLCALC, TRIG, CHOLHDL, LDLDIRECT in the last 72 hours. Thyroid Function Tests No results for input(s): TSH, T4TOTAL, T3FREE, THYROIDAB in the last 72 hours.  Invalid input(s): FREET3  TELE  Atrial fib with a controlled VR  Radiology/Studies  Dg Chest 2 View  03/30/2015   CLINICAL DATA:  Chest pain and right shoulder pain after falling down steps coming out of a trailer today.  EXAM: CHEST  2 VIEW  COMPARISON:  01/26/2014  FINDINGS: There is new cardiomegaly with a new small right pleural effusion. There is slight pulmonary vascular congestion. No infiltrates. No acute osseous abnormality.  IMPRESSION: New cardiomegaly with pulmonary vascular congestion and a new small right pleural  effusion. Findings consistent with mild congestive heart failure.   Electronically Signed   By: [name] M.D.   On: 03/30/2015 15:24   Dg Shoulder Right  03/30/2015   CLINICAL DATA:  59 year old male with right shoulder pain after falling down several steps today. Decreased range of motion.  EXAM: RIGHT SHOULDER - 2+ VIEW  COMPARISON:  Concurrently obtained chest x-ray.  FINDINGS: No acute fracture or malalignment. Degenerative osteoarthritis present at the acromioclavicular joint. Clavicle and scapula appear intact. Visualized thorax demonstrates no evidence of acute rib fracture. Normal bony mineralization. There is a small right-sided pleural effusion.  IMPRESSION: 1. No acute fracture or malalignment. 2. AC joint osteoarthritis. 3. Small right pleural effusion.   Electronically Signed   By: [name] M.D.   On: 03/30/2015 15:23   Nm Myocar Multi W/spect W/wall Motion / Ef  04/01/2015    There was no ST segment deviation noted during stress.  Findings consistent with prior myocardial infarction.  This is a high risk study.  The left ventricular ejection fraction is moderately decreased (30-44%).   Small area of inferior wall infarct from apex to base no ischemia.   EF 37% with diffuse hypokinesis but suggest echo correlation As underlying rhythm afib with RBBB and surface EF may not be accurate High Risk by nature of depressed EF    ASSESSMENT AND PLAN  1. Syncope 2. Atrial fib 3. RBBB 4. LV dysfunction Rec: for insertion of an ILR. He could be discharged after procedure if no additional issues.  [name],M.D.  04/06/2015 7:11 AM

## 2015-04-06 NOTE — Progress Notes (Signed)
ANTICOAGULATION CONSULT NOTE - Follow Up Consult  Pharmacy Consult for Coumadin Indication: atrial fibrillation  Allergies  Allergen Reactions  . Pravastatin Sodium Other (See Comments)    myalgia  . Simvastatin Other (See Comments)    severe leg cramps  . Statins Other (See Comments)    myalgia  . Tape Other (See Comments)    Skin Tears Use Paper Tape Only    Patient Measurements: Height: 5\' 10"  (177.8 cm) Weight: (!) 302 lb 8 oz (137.213 kg) IBW/kg (Calculated) : 73 Heparin Dosing Weight:    Vital Signs: Temp: 98.6 F (37 C) (06/22 0851) Temp Source: Oral (06/22 0851) BP: 97/48 mmHg (06/22 0851) Pulse Rate: 81 (06/22 0851)  Labs:  Recent Labs  04/04/15 0517 04/05/15 0446 04/06/15 0318  HGB  --  8.8*  --   HCT  --  30.4*  --   PLT  --  381  --   LABPROT 23.2* 25.3* 28.3*  INR 2.08* 2.33* 2.71*  CREATININE 1.08 1.09 1.19    Estimated Creatinine Clearance: 92.2 mL/min (by C-G formula based on Cr of 1.19).  Assessment: 59 yo who was admitted for recurrent afib, syncope (fell down the steps). He has been on coumadin for anticoagulation. The admission INR was 3.69 so coumadin was held. Home amiodarone d/c'd.   Anticoagulation: Admit INR 3.69 (on amio). INR 2.71. Hgb low but stable 8.8. Home Coumadin dose: 5 mg on Mon/Wed/Fri/Sat; 2.5 mg TThSun  Goal of Therapy:  INR 2-3 Monitor platelets by anticoagulation protocol: Yes   Plan:  Repeat Coumadin 2.5mg  PO x1 today Would recommend discharge on previous home dose with a f/u INR next week.   [name] S. Alford Highland, PharmD, BCPS Clinical Staff Pharmacist Pager [phone]  [name] 04/06/2015,8:52 AM

## 2015-04-06 NOTE — Progress Notes (Signed)
59 year old with multiple medical problems including atrial fibrillation . He been managed with amiodarone and recently underwent cardioversion which failed to hold. He then had recurrent syncope prompting this hospitalization. He has known bifascicular block.  He has ( perhaps iatrogenic) hyperthyroidism. He has chronic anemia  He has an antecedent history of presumed tachycardia-induced cardiomyopathy Last echo 05/2013 demonstrated EF 60%, mild LVH, LA 58. TEE 02/2015 demonstrated EF 50-55%, mild to moderate MR, mild to moderate TR.   Myoview >> EF 30-45% ( afib) but no ischemia Perfusion defect could be diaghragmatic attenuation ( note TEE nuc discordance)  Shortness of breath is better there is less swelling in his hands and peripheral edema is improved. He has some lightheadedness with standing still   Subjective: Overall improved SOB with diuresis, but continues with his usual SOB.    Objective: Vital signs in last 24 hours: Temp:  [97.6 F (36.4 C)-98.5 F (36.9 C)] 98.5 F (36.9 C) (06/22 0400) Pulse Rate:  [57-98] 82 (06/22 0400) Resp:  [18-19] 18 (06/22 0400) BP: (78-121)/(35-98) 116/76 mmHg (06/22 0400) SpO2:  [92 %-100 %] 100 % (06/22 0400) Weight:  [302 lb 8 oz (137.213 kg)] 302 lb 8 oz (137.213 kg) (06/22 0400) Weight change: 1.6 oz (0.045 kg) Last BM Date: 04/05/15 Intake/Output from previous day: -2978 06/21 0701 - 06/22 0700 In: 340 [P.O.:340] Out: 250 [Urine:250] Intake/Output this shift:    PE: General:Pleasant affect, NAD Skin:Warm and dry, brisk capillary refill HEENT:normocephalic, sclera clear, mucus membranes moist Heart:irreg irreg  without murmur, gallup, rub or click Lungs:clear to diminished without rales, rhonchi, or wheezes WSF:KCLEX, soft, non tender, + BS, do not palpate liver spleen or masses Ext:1+ lower ext edema Lt > Rt though much improved, 2+ pedal pulses, 2+ radial pulses Neuro:alert and oriented, MAE, follows commands, +  facial symmetry Tele:  A fib brief pauses 1.8 sec , occ HR to 120s     Lab Results:  Recent Labs  04/05/15 0446  WBC 8.4  HGB 8.8*  HCT 30.4*  PLT 381   BMET  Recent Labs  04/05/15 0446 04/06/15 0318  NA 131* 134*  K 4.5 4.9  CL 92* 92*  CO2 31 33*  GLUCOSE 100* 108*  BUN 29* 33*  CREATININE 1.09 1.19  CALCIUM 8.7* 9.0   No results for input(s): TROPONINI in the last 72 hours.  Invalid input(s): CK, MB  Lab Results  Component Value Date   CHOL 99 03/07/2015   HDL 30.10* 03/07/2015   LDLCALC 54 03/07/2015   LDLDIRECT 170.2 08/18/2013   TRIG 73.0 03/07/2015   CHOLHDL 3 03/07/2015   Lab Results  Component Value Date   HGBA1C 5.8 03/07/2015     Lab Results  Component Value Date   TSH 0.011* 04/01/2015      Studies/Results: ECHO: Study Conclusions  - Left ventricle: The cavity size was moderately dilated. Systolic function was mildly to moderately reduced. The estimated ejection fraction was in the range of 40% to 45%. Diffuse hypokinesis. - Mitral valve: Calcified annulus. There was mild regurgitation. - Left atrium: The atrium was severely dilated. - Right ventricle: The cavity size was moderately dilated. Wall thickness was normal. Systolic function was moderately reduced. - Right atrium: The atrium was severely dilated. - Pulmonary arteries: Systolic pressure was mildly increased. PA peak pressure: 38 mm Hg (S).  Impressions:  - When compared to prior, EF is reduced  MYOVIEW:  Medications: I have reviewed the patient's current medications. Scheduled  Meds: . ALPRAZolam  0.5 mg Oral QHS  . B-complex with vitamin C  1 tablet Oral Daily  . carisoprodol  350 mg Oral TID  . docusate sodium  200 mg Oral Daily  . doxepin  25 mg Oral BID  . ferrous sulfate  325 mg Oral Daily  . insulin aspart  0-15 Units Subcutaneous TID WC  . methimazole  10 mg Oral BID  . mometasone-formoterol  2 puff Inhalation BID  . nebivolol  7.5 mg Oral Daily    . pantoprazole  40 mg Oral BID  . polyethylene glycol  17 g Oral BID  . potassium chloride  40 mEq Oral Daily  . ranolazine  500 mg Oral BID  . sertraline  200 mg Oral Q supper  . sodium chloride  3 mL Intravenous Q12H  . tiZANidine  2 mg Oral QHS  . torsemide  40 mg Oral Daily  . traZODone  50 mg Oral QHS  . Warfarin - Pharmacist Dosing Inpatient   Does not apply q1800  . zolpidem  10 mg Oral QHS   Continuous Infusions:  PRN Meds:.sodium chloride, acetaminophen, albuterol, alum & mag hydroxide-simeth, hydrOXYzine, ibuprofen, Linaclotide, loratadine, ondansetron (ZOFRAN) IV, polyethylene glycol, sodium chloride, witch hazel-glycerin  Assessment/Plan: Principal Problem:   Syncope- + orthostatic hypotension may have RVR with his a fib also has bifascular block.  Active Problems:   Controlled diabetes mellitus type II without complication- glucose 94- 119   Morbid obesity--Obesity limits our ability to maintain SR He is interested in AF nutrition program - will refer   Essential hypertension- controlled BP 97/56 to 116/67    Anticoagulated on warfarin- INR 2.08    Chronic diastolic heart failure   Bundle branch block, right and extreme left anterior fascicular   Anemia h/h 8.7/9.0 on 04/03/15 on iron replacement - was to see GI at [facility] for colonoscopy but has been postponed.     Abnormal TSH--hyperthyroidism- beginning Methimazole 10 mg BID    Acute on chronic diastolic heart failure (-10,626 since admit)  Wt down from 330 to 305 lbs, 25 lbs.)  On lasix 80 IV BID. Mild hyponatremia. rec'd IV lasix this AM, ? Change to po this pm vs in am, was on 80 mg po BID    Persistent atrial fibrillation- follow in a fib clinic- amiodarone has been stopped-last dose 03/31/15-- would consider alternative AAD therapy with Tikosyn after amiodarone washout if he feels badly with rate control or cannot be rate controlled.     Sleep Apnea, has been intolerant of CPAP- Recommend repeat pulmonary  evaluation to reconsider   Tachycardia with a fib at times plan for loop prior to discharge. On cardizem 948 and bystolic 10    Negative myoview EF 30-45% ( afib) but no ischemia Perfusion defect could be diaghragmatic attenuation ( note TEE nuc discordance)    Borderline Hyperkalemia will decrease K+ to once daily.      Principal Problem:   Syncope Active Problems:   Chronic diastolic heart failure   Controlled diabetes mellitus type II without complication   Morbid obesity   Essential hypertension   Anticoagulated on warfarin   Bundle branch block, right and extreme left anterior fascicular   Anemia   Abnormal TSH   Acute on chronic diastolic heart failure   Persistent atrial fibrillation  Still with some edema   Discharge on torsemide 40 bid Continue methimazone 10 bid and he has appt with endo ILR today for syncope and tehn plan is  discharge Afib rates while sometimes fast are overall well controlled  BP is mostly ok although some low BP esp in evening D/c ranolazine  Was to be adjunct to amio Still needs stool quaac  Never made it to the lab  Can we send home with cards Continue Fe  Needs ToC visit DCarroll NP AFib for med review 3-4 weeks SK 8-10 weeks

## 2015-04-06 NOTE — CV Procedure (Signed)
EP Procedure Note  Procedure: insertion of an ILR  Indication: unexplained syncope  Preprocedure diagnosis: syncope  Postprocedure diagnosis: same as preprocedure  Description of the procedure: After informed consent was obtained, the patient was then prepped and draped in the sterile manner. 20 cc of lidocaine was infiltrated over the left pectoral region. A 1 cm stab incision was carried out. The Medtronic implantable loop recorder, serial number [id number] was inserted and the R waves measured 0.25 mV. Benzoin and Steri-Strips her pain on the skin. A bandage was placed. She was returned to her room in satisfactory condition.  Complications: No immediate procedure competitions  Conclusion: Successful insertion of a Medtronic implantable loop recorder in a patient with unexplained syncope.  Cristopher Peru, M.D.

## 2015-04-06 NOTE — Discharge Summary (Signed)
ELECTROPHYSIOLOGY PROCEDURE DISCHARGE SUMMARY    Patient ID: [name],  MRN: [id number], DOB/AGE: [birth year] 59 y.o.  Admit date: 03/30/2015 Discharge date: 04/06/2015  Primary Care Physician: [name], MD Primary Cardiologist: [name]  Primary Discharge Diagnosis:  Persistent atrial fibrillation and syncope  Secondary Discharge Diagnosis:  1.  Sleep apnea intolerant of CPAP 2.  Morbid obesity 3.  Hyperthyroidism 4.  Fibromyalgia 5.  Previous tachycardia induced cardiomyopathy   Allergies  Allergen Reactions  . Pravastatin Sodium Other (See Comments)    myalgia  . Simvastatin Other (See Comments)    severe leg cramps  . Statins Other (See Comments)    myalgia  . Tape Other (See Comments)    Skin Tears Use Paper Tape Only     Procedures This Admission: 1.  Myoview 04/01/15 demonstrated prior MI with EF 30-44% 2.  Echocardiogram 04/01/15 demonstrated EF 40-45%, diffuse hypokinesis, PA pressure 38 3.  Implantation of a MDT LINQ on 04/06/15 by Dr Lovena Le for syncope.   Brief HPI/Hospital Course:  [name] is a 59 y.o. male with a past medical history as outlined above.  He was first diagnosed with atrial fibrillation in 2001 and was placed on amiodarone in 2013.  He had maintained SR until February of this year when he developed palpitations, shortness of breath, and fatigue.  He was seen by Dr [name] and underwent cardioversion but had ERAF after 1 day.  He was scheduled to be seen in the AF clinic but on the day of appointment had 2 episodes of orthostatic syncope and called EMS.  He was then transported to [facility] for further evaluation.  He was found to be in AF with  He underwent myoview scan for syncope with was considered high risk because of decreased EF.  Echocardiogram demonstrated EF of 40-45%. Because of hyperthyroidism on labs, amiodarone was discontinued with a plan for rate control. His rate control was titrated and prior to discharge underwent ILR  implantation for syncope.  He will have a TOC visit in 1 week with wound check and follow up with [name] in the AF clinic in 4 weeks.    His furosemide was changed to Torsemide at discharge.  Amiodarone and Ranexa have been discontinued.  He will be discharged on Methimazole and has outpatient follow up arranged with endocrinology.   At follow-up, he will need BMET and reassessment of diuretic needs.  He is interested in the AF nutrition program and should be referred at AF clinic follow up.   Physical Exam: Filed Vitals:   04/05/15 2330 04/05/15 2353 04/06/15 0400 04/06/15 0851  BP: 78/35 83/41 116/76 97/48  Pulse:  86 82 81  Temp:  98.2 F (36.8 C) 98.5 F (36.9 C) 98.6 F (37 C)  TempSrc:  Oral Oral Oral  Resp:  19 18 18   Height:      Weight:   302 lb 8 oz (137.213 kg)   SpO2:  96% 100% 96%    Labs:   Lab Results  Component Value Date   WBC 8.4 04/05/2015   HGB 8.8* 04/05/2015   HCT 30.4* 04/05/2015   MCV 69.1* 04/05/2015   PLT 381 04/05/2015    Recent Labs Lab 04/01/15 0251  04/06/15 0318  NA 138  < > 134*  K 4.4  < > 4.9  CL 102  < > 92*  CO2 27  < > 33*  BUN 15  < > 33*  CREATININE 0.86  < >  1.19  CALCIUM 8.5*  < > 9.0  PROT 5.8*  --   --   BILITOT 0.8  --   --   ALKPHOS 72  --   --   ALT 55  --   --   AST 32  --   --   GLUCOSE 109*  < > 108*  < > = values in this interval not displayed.   Discharge Medications:    Medication List    STOP taking these medications        amiodarone 200 MG tablet  Commonly known as:  PACERONE     furosemide 80 MG tablet  Commonly known as:  LASIX     ranolazine 500 MG 12 hr tablet  Commonly known as:  RANEXA      TAKE these medications        albuterol 108 (90 BASE) MCG/ACT inhaler  Commonly known as:  PROVENTIL HFA;VENTOLIN HFA  Inhale 2 puffs into the lungs 4 (four) times daily.     PROAIR HFA 108 (90 BASE) MCG/ACT inhaler  Generic drug:  albuterol  INHALE 2 PUFFS INTO THE LUNGS 4 TIMES A DAY      ALPRAZolam 0.5 MG tablet  Commonly known as:  XANAX  TAKE 1 TABLET BY MOUTH TWICE A DAY AS NEEDED FOR ANXIETY     b complex vitamins tablet  Take 1 tablet by mouth daily.     carisoprodol 350 MG tablet  Commonly known as:  SOMA  Take 350 mg by mouth at bedtime.     clobetasol cream 0.05 %  Commonly known as:  TEMOVATE  Apply 1 application topically daily as needed (itching).     diltiazem 240 MG 24 hr capsule  Commonly known as:  CARDIZEM CD  Take 1 capsule (240 mg total) by mouth daily.     diphenhydrAMINE 50 MG tablet  Commonly known as:  BENADRYL  Take 1 tablet (50 mg total) by mouth every 6 (six) hours as needed for itching.     doxepin 25 MG capsule  Commonly known as:  SINEQUAN  Take 25 mg by mouth 2 (two) times daily.     ferrous sulfate 325 (65 FE) MG tablet  Take 325 mg by mouth daily.     Fluticasone-Salmeterol 250-50 MCG/DOSE Aepb  Commonly known as:  ADVAIR  Inhale 2 puffs into the lungs 2 (two) times daily.     hydrocortisone 2.5 % cream  Apply topically 2 (two) times daily.     hydrocortisone 25 MG suppository  Commonly known as:  ANUSOL-HC  Place 1 suppository (25 mg total) rectally every 12 (twelve) hours.     hydrOXYzine 25 MG tablet  Commonly known as:  ATARAX/VISTARIL  TAKE 1 TABLET BY MOUTH 3 TIMES A DAY FOR ITCHING     ibuprofen 200 MG tablet  Commonly known as:  ADVIL,MOTRIN  Take 400 mg by mouth at bedtime as needed for mild pain (pain).     Linaclotide 145 MCG Caps capsule  Commonly known as:  LINZESS  Take 145 mcg by mouth daily as needed (Crampy pain).     Loratadine 10 MG Caps  Take 10 mg by mouth daily as needed (itching).     metFORMIN 500 MG tablet  Commonly known as:  GLUCOPHAGE  Take 1,000 mg by mouth daily with breakfast.     methimazole 10 MG tablet  Commonly known as:  TAPAZOLE  Take 1 tablet (10 mg total) by mouth 2 (two)  times daily.     nebivolol 2.5 MG tablet  Commonly known as:  BYSTOLIC  Take 3 tablets (7.5  mg total) by mouth daily.     omeprazole 20 MG capsule  Commonly known as:  PRILOSEC  Take 20 mg by mouth daily with supper.     polyethylene glycol powder powder  Commonly known as:  GLYCOLAX/MIRALAX  Take 255 g by mouth daily.     potassium chloride SA 20 MEQ tablet  Commonly known as:  K-DUR,KLOR-CON  Take 2 tablets (40 mEq total) by mouth daily.     sertraline 100 MG tablet  Commonly known as:  ZOLOFT  Take 2 tablets (200 mg total) by mouth at bedtime.     tiZANidine 2 MG tablet  Commonly known as:  ZANAFLEX  Take 1 tablet (2 mg total) by mouth 3 (three) times daily as needed for muscle spasms.     Tolnaftate Powd  Apply 1 application topically daily. For jock itch     torsemide 20 MG tablet  Commonly known as:  DEMADEX  Take 2 tablets (40 mg total) by mouth 2 (two) times daily.     traZODone 100 MG tablet  Commonly known as:  DESYREL  Take 50 mg by mouth at bedtime. For sleep     warfarin 5 MG tablet  Commonly known as:  COUMADIN  Take as directed by anticoagulation clinic     witch hazel-glycerin pad  Commonly known as:  TUCKS  Apply 1 application topically as needed for itching, irritation or hemorrhoids.     zolpidem 10 MG tablet  Commonly known as:  AMBIEN  TAKE 1 TABLET BY MOUTH AT BEDTIME AS NEEDED FOR SLEEP        Disposition:  Discharge Instructions    AMB Referral to [facility]    Complete by:  As directed   Reason for consult:  Medication follow up in community - difficulty affording new medications; cardiac education f/u  Diagnoses of:   COPD/ Pneumonia Diabetes Heart Failure    Expected date of contact:  1-3 days (reserved for hospital discharges)  Please assign to community nurse for transition of care calls and assess for home visits. Patient states he has had care management in the past and it helped him a lot. Needs follow up with his medications - affording new medications, Community to determine if there are Pharmacy needs post  discharge.  In patient RNCM working on assistance, as well, if possible.  Questions please call:  Natividad Brood, RN BSN [facility] Liaison  [phone] business mobile phone     Diet - low sodium heart healthy    Complete by:  As directed      Increase activity slowly    Complete by:  As directed           Follow-up Information    Follow up with Murray Hodgkins, NP On 04/14/2015.   Specialties:  Nurse Practitioner, Cardiology, Radiology   Why:  at 3:30PM with wound check for LINQ same time   Contact information:   [address] [place] [address] [phone]       Follow up with [facility] On 05/02/2015.   Why:  at 11AM   Contact information:   [place] [id number] [phone]      Duration of Discharge Encounter: Greater than 30 minutes including physician time.  Signed, Chanetta Marshall, NP 04/06/2015 10:00 AM

## 2015-04-07 ENCOUNTER — Ambulatory Visit (HOSPITAL_COMMUNITY): Payer: Commercial Managed Care - HMO | Admitting: Nurse Practitioner

## 2015-04-07 ENCOUNTER — Telehealth: Payer: Self-pay | Admitting: Internal Medicine

## 2015-04-07 ENCOUNTER — Other Ambulatory Visit: Payer: Self-pay

## 2015-04-07 NOTE — Patient Outreach (Signed)
Transition of care call: Hospital Discharge on 04/06/2015  Spoke with patient who reports that he is" about the same" Reports that he is short of breath but this is normal for him.  Patient reports that he has all of his medications and is taking his medications as prescribed.  Reports that his daughter takes him to his appointments as needed.  Reports that he has small amount of drainage at the loop recorder insertion site.   Patient has follow up appointments in 1 week.  He confirmed his appointments.  Reports that he does not check CBG. Reports his diabetes is under good control.  Offered initial home assessment. Patient accepted. Home visit planned for Monday 6/27. Confirmed address and provided my contact information. Sent a request to hospital liaison to confirm if home health order was placed at discharge. Awaiting a response.  Tomasa Rand, RN, BSN, CEN [facility] [employer] [phone]

## 2015-04-07 NOTE — Telephone Encounter (Signed)
Needs a TOc phone call .[name] Appt is 04/14/15

## 2015-04-08 NOTE — Telephone Encounter (Signed)
Patient contacted regarding discharge from [facility] on 04/06/15.  Patient understands to follow up with provider Ignacia Bayley, NP on 04/14/15 at 3:30p at [facility]. Patient understands discharge instructions? yes Patient understands medications and regiment? yes Patient understands to bring all medications to this visit? yes

## 2015-04-10 ENCOUNTER — Other Ambulatory Visit: Payer: Self-pay | Admitting: Internal Medicine

## 2015-04-11 ENCOUNTER — Other Ambulatory Visit: Payer: Self-pay

## 2015-04-11 VITALS — BP 132/72 | HR 110 | Resp 20 | Ht 70.0 in | Wt 307.0 lb

## 2015-04-11 DIAGNOSIS — I509 Heart failure, unspecified: Secondary | ICD-10-CM

## 2015-04-11 NOTE — Patient Outreach (Signed)
[facility]) Care Management   04/11/2015  [name] 03/12/1956 [id number]  [name] is an 59 y.o. male Initial home visit at 2pm.. Daughter Melody Uzzle present during home visit. Subjective:  Patient reports that he was discharged from [facility]  5 days ago. Reports that he still feels weak.  He reports that he wants to back to his normal. Currently patient reports shortness of breath with little activity.   Patient reports that he has gained 5 pounds since hospital discharge. However patient has not been weighing daily. No records or recorded weights.  Patient reports that he is managing well with his medications. Patient states that he use to have a [employer] nurse who came out weekly to assist as needed.  Patient reports that he can feel when he has an abnormal heart beat.  Reports that his loop recorder is doing well to his knowledge. Patient reports joint pain. Patient reports that he sleeps in his recliner for comfort and to help with breathing. Patient complains of a hoarse voice and having a dry mouth.   Objective:  Filed Vitals:   04/11/15 1415  BP: 132/72  Pulse: 110  Resp: 20  Height: 1.778 m (_0 )  Weight: 307 lb (139.254 kg)  SpO2: 97%    Ambulates slowly in the home.  Noted that skin is bruised on both arms.  Dressing ( band aid) intact to the left chest.  Awake and alert. Answers questions appropriately.   Swelling noted to be more in the left leg than right. Edema noted to the knee ( trace) More edema to the lower left leg.  2 plus.  Lungs clear. Dry Cough noted.  Review of Systems  Respiratory: Positive for cough, shortness of breath and wheezing.   Cardiovascular: Positive for leg swelling.  Musculoskeletal: Positive for joint pain.  Endo/Heme/Allergies: Bruises/bleeds easily.    Physical Exam  Constitutional: He is oriented to person, place, and time. He appears well-developed and well-nourished.  Cardiovascular: Intact distal pulses.    Irregular heart beat noted.  Respiratory: Effort normal. No respiratory distress. He has wheezes.  Talking in complete sentences. No oxygen use during this home visit.  GI: Soft. Bowel sounds are normal. There is tenderness.  Abdomen with generalized tenderness all over.  Musculoskeletal: He exhibits edema.  Neurological: He is alert and oriented to person, place, and time.  Skin: Skin is warm and dry.  Bruising noted to both arms.  Skin intact to both feet.   Psychiatric: He has a normal mood and affect. His behavior is normal. Judgment and thought content normal.    Current Medications:   Current Outpatient Prescriptions  Medication Sig Dispense Refill  . albuterol (PROVENTIL HFA;VENTOLIN HFA) 108 (90 BASE) MCG/ACT inhaler Inhale 2 puffs into the lungs 4 (four) times daily.    [name] ALPRAZolam (XANAX) 0.5 MG tablet TAKE 1 TABLET BY MOUTH TWICE A DAY AS NEEDED FOR ANXIETY (Patient taking differently: Take 0.5 mg by mouth at bedtime. ) 60 tablet 2  . b complex vitamins tablet Take 1 tablet by mouth daily.    . carisoprodol (SOMA) 350 MG tablet Take 350 mg by mouth at bedtime.     [name] diltiazem (CARDIZEM CD) 240 MG 24 hr capsule Take 1 capsule (240 mg total) by mouth daily. 30 capsule 1  . doxepin (SINEQUAN) 25 MG capsule Take 25 mg by mouth 2 (two) times daily.    . ferrous sulfate 325 (65 FE) MG tablet Take  325 mg by mouth daily.    . Fluticasone-Salmeterol (ADVAIR) 250-50 MCG/DOSE AEPB Inhale 2 puffs into the lungs 2 (two) times daily.     . hydrocortisone 2.5 % cream Apply topically 2 (two) times daily. (Patient taking differently: Apply 1 application topically 2 (two) times daily. ) 30 g 5  . hydrOXYzine (ATARAX/VISTARIL) 25 MG tablet TAKE 1 TABLET BY MOUTH 3 TIMES A DAY FOR ITCHING (Patient taking differently: TAKE 1 TABLET BY MOUTH 3 TIMES A DAY AS NEEDED FOR ITCHING) 30 tablet 1  . ibuprofen (ADVIL,MOTRIN) 200 MG tablet Take 400 mg by mouth at bedtime as needed for mild pain (pain).      . Linaclotide (LINZESS) 145 MCG CAPS capsule Take 145 mcg by mouth daily as needed (Crampy pain).     . Loratadine 10 MG CAPS Take 10 mg by mouth daily as needed (itching).     . methimazole (TAPAZOLE) 10 MG tablet Take 1 tablet (10 mg total) by mouth 2 (two) times daily. 60 tablet 1  . omeprazole (PRILOSEC) 20 MG capsule Take 20 mg by mouth daily with supper.     . polyethylene glycol powder (GLYCOLAX/MIRALAX) powder Take 255 g by mouth daily. (Patient taking differently: Take 17 g by mouth 2 (two) times daily as needed (constipation). Mix in 8 oz liquid and drink) 255 g 3  . potassium chloride SA (K-DUR,KLOR-CON) 20 MEQ tablet Take 2 tablets (40 mEq total) by mouth daily. 30 tablet 1  . PROAIR HFA 108 (90 BASE) MCG/ACT inhaler INHALE 2 PUFFS INTO THE LUNGS 4 TIMES A DAY 8.5 Inhaler 5  . ranolazine (RANEXA) 500 MG 12 hr tablet Take 500 mg by mouth 2 (two) times daily.    . sertraline (ZOLOFT) 100 MG tablet Take 2 tablets (200 mg total) by mouth at bedtime. (Patient taking differently: Take 200 mg by mouth daily with supper. ) 180 tablet 3  . tiZANidine (ZANAFLEX) 2 MG tablet Take 1 tablet (2 mg total) by mouth 3 (three) times daily as needed for muscle spasms. (Patient taking differently: Take 2 mg by mouth at bedtime. ) 30 tablet 4  . Tolnaftate POWD Apply 1 application topically daily. For jock itch    . torsemide (DEMADEX) 20 MG tablet Take 2 tablets (40 mg total) by mouth 2 (two) times daily. 60 tablet 1  . traZODone (DESYREL) 100 MG tablet Take 50 mg by mouth at bedtime. For sleep  1  . warfarin (COUMADIN) 5 MG tablet Take as directed by anticoagulation clinic (Patient taking differently: Take 2.5-5 mg by mouth daily with supper. Take 1 tablet (5 mg) Monday, Wednesday, Friday and Saturday, take 1/2 tablet (2.5 mg) Tuesday, Thursday and Sunday or as directed by anticoagulation clinic) 30 tablet 3  . witch hazel-glycerin (TUCKS) pad Apply 1 application topically as needed for itching, irritation  or hemorrhoids.     [name] zolpidem (AMBIEN) 10 MG tablet TAKE 1 TABLET BY MOUTH AT BEDTIME AS NEEDED FOR SLEEP (Patient taking differently: TAKE 1 TABLET BY MOUTH AT BEDTIME) 30 tablet 5  . clobetasol cream (TEMOVATE) 6.81 % Apply 1 application topically daily as needed (itching).     . diphenhydrAMINE (BENADRYL) 50 MG tablet Take 1 tablet (50 mg total) by mouth every 6 (six) hours as needed for itching. (Patient not taking: Reported on 04/11/2015) 30 tablet 2  . doxepin (SINEQUAN) 25 MG capsule TAKE ONE CAPSULE BY MOUTH TWICE A DAY 180 capsule 1  . hydrocortisone (ANUSOL-HC) 25 MG suppository Place 1  suppository (25 mg total) rectally every 12 (twelve) hours. (Patient not taking: Reported on 03/30/2015) 12 suppository 1  . metFORMIN (GLUCOPHAGE) 500 MG tablet Take 1,000 mg by mouth daily with breakfast.     . nebivolol (BYSTOLIC) 2.5 MG tablet Take 3 tablets (7.5 mg total) by mouth daily. 30 tablet 1  . [DISCONTINUED] atorvastatin (LIPITOR) 20 MG tablet Take 1 tablet (20 mg total) by mouth daily. 30 tablet 11  . [DISCONTINUED] gabapentin (NEURONTIN) 300 MG capsule Take 1 capsule (300 mg total) by mouth 3 (three) times daily. 90 capsule 5   No current facility-administered medications for this visit.    Functional Status:   In your present state of health, do you have any difficulty performing the following activities: 04/11/2015 03/30/2015  Hearing? N N  Vision? Y N  Difficulty concentrating or making decisions? Y N  Walking or climbing stairs? Y Y  Dressing or bathing? N N  Doing errands, shopping? Y N  Preparing Food and eating ? N -  Using the Toilet? N -  In the past six months, have you accidently leaked urine? N -  Do you have problems with loss of bowel control? N -  Managing your Medications? N -  Managing your Finances? N -  Housekeeping or managing your Housekeeping? Y -    Fall/Depression Screening:    PHQ 2/9 Scores 04/11/2015 02/23/2015 02/16/2014  PHQ - 2 Score 0 0 1     Assessment:   (1) Explained THN program.   (2) Patients meds do not match his discharge paperwork. Patient is taking bystolic 17m instead of 7.5 mg.  Patient is taking Ranexa 500 mg BID.  It is noted on discharge paper work to stop.  (3) not weighing daily. (4) Wants to get advance directive paperwork completed. (5) Increased risk for falls. (6) Patient is concerned about cost of meds and if joint pain could be related to medications. Cost of advair out of pocket is 47.00 per month.   Plan:  (1) denies needing any changes to consent form. (2) Will notify Dr. KCaryl Comesand inquire about correct meds and doses. Will send this record to MD. (3) See care plan. Discussed at great length about heart failure zones and the importance of daily weights. (4) [place]. (5) Encouraged patient to use cane all the time. Encouraged patient to change positions slowly. (6) referral to [place] to review and assist patient with needs. See care plan.  Patient will continue to be outreached weekly in the transition of care program. Next home visit planned for July 26th.  At this time will assess progress toward goals.   Collaborative goal planning and goal setting with patient and daughter. Patients primary goal is to avoid readmission and improve his CHF management.      [facility] Problem One        [place]  Patient with recent admission due to heart failure   Care Plan for Problem One  Active   [address] LL[facility] Term Goal (31-90 days)  Patient will be able to report no readmissions in the next 31 days.   THN Long Term Goal Start Date  04/07/15 [discharge date of 04/06/2015]   Interventions for Problem One Long Term Goal  Reviewed meds during home visit. discussed ways to be sucessful at home. Provided 24 hour CALL A NURSE contact information.  THN CM Short Term Goal #1 (0-30  days)  Patient will follow up with primary MD within 7-10 days from hospital discharge.   THN CM Short Term Goal #1 Start Date  04/07/15   Interventions for Short Term Goal #1  Patient has follow up planned. Reinforced importance of taking meds as prescribed.    THN CM Short Term Goal #2 (0-30 days)  Patient will report decreased amount of drainage from loop recorder insertion site in the next 5 days.   THN CM Short Term Goal #2 Start Date  04/07/15 [patient reports small amounts of drainage]   THN CM Short Term Goal #2 Met Date  04/11/15 [facility] met. no draininage in the lst 3 days.]   Interventions for Short Term Goal #2  goal met    [facility] CM Care Plan Problem Two        Patient Outreach from 04/11/2015 in [place] Problem Two  Knowledge deficit related to CHF as evidence by lack of understanding of importance.   Care Plan for Problem Two  Active   Interventions for Problem Two Long Term Goal   Encouraged good record keeping. Reviewed the benefits of using the [facility] calendar to record daily weights.   THN Long Term Goal (31-90) days  Patient will be able to verbalize better control of heart failure symptoms and will be able to verbalize when to call MD in the next 60 days.   THN Long Term Goal Start Date  04/11/15   THN CM Short Term Goal #1 (0-30 days)  Patient will be able to report weighing daily for the next 30 days.   THN CM Short Term Goal #1 Start Date  04/11/15   Interventions for Short Term Goal #2   Provided [facility] calendar, discussed importance of keeping up with daily weights, Discussed CHF zones and reasons to call MD. Reviewed low salt foods. Reviewed importance of keeping legs elevated. Discussed trying ice chips and gum to keep mouth moist vs drinking extra water.    THN CM Short Term Goal #2 (0-30 days)  Patient will be able to report understanding of the correct meds to take in the next 5 days.   THN CM Short Term Goal #2 Start Date  04/11/15   Interventions  for Short Term Goal #2  Reviewed all meds with patient during home visit, Sent a referral to [place] for med review. Will contact MD office to get clarification of correct meds.      Tomasa Rand, RN, BSN, CEN [facility] [employer] [phone]

## 2015-04-12 ENCOUNTER — Telehealth: Payer: Self-pay | Admitting: Internal Medicine

## 2015-04-12 ENCOUNTER — Telehealth: Payer: Self-pay | Admitting: Pharmacist

## 2015-04-12 ENCOUNTER — Other Ambulatory Visit: Payer: Self-pay | Admitting: Internal Medicine

## 2015-04-12 ENCOUNTER — Other Ambulatory Visit: Payer: Self-pay

## 2015-04-12 ENCOUNTER — Ambulatory Visit (INDEPENDENT_AMBULATORY_CARE_PROVIDER_SITE_OTHER): Payer: Commercial Managed Care - HMO | Admitting: General Practice

## 2015-04-12 DIAGNOSIS — I4891 Unspecified atrial fibrillation: Secondary | ICD-10-CM | POA: Diagnosis not present

## 2015-04-12 DIAGNOSIS — Z5181 Encounter for therapeutic drug level monitoring: Secondary | ICD-10-CM | POA: Diagnosis not present

## 2015-04-12 LAB — POCT INR: INR: 5.7

## 2015-04-12 NOTE — Progress Notes (Signed)
Pre visit review using our clinic review tool, if applicable. No additional management support is needed unless otherwise documented below in the visit note. 

## 2015-04-12 NOTE — Telephone Encounter (Signed)
New message    1. Patient discharge last Wednesday   2.   C/O 5 lb weight gain   Pt c/o medication issue:  1. Name of Medication: Renexa 947 mg    Bystolic 5 mg   2. How are you currently taking this medication (dosage and times per day)? Renexa twice a day /  Bystolic once a day   3. Are you having a reaction (difficulty breathing--STAT)? No   4. What is your medication issue? Need clarification . According to discharge summary pt should be discontinue from [place] should be  7.5 mg.

## 2015-04-12 NOTE — Patient Outreach (Signed)
[place] [facility]) Care Management  [place]   04/12/2015  BRONSYN SHAPPELL 1956-07-06 [id number]  Subjective: [name] is a 59 y.o. male who was referred to [place] for medication reconciliation. Patient also has complaints of muscle pain and believes it may be related to a medication.   I called patient but he reported that he was not able to talk right now and requested that I call him back later today.   Objective:   Current Medications: Current Outpatient Prescriptions  Medication Sig Dispense Refill  . ADVAIR DISKUS 250-50 MCG/DOSE AEPB TAKE 1 PUFF BY MOUTH DAILY AS DIRECTED 60 each 3  . albuterol (PROVENTIL HFA;VENTOLIN HFA) 108 (90 BASE) MCG/ACT inhaler Inhale 2 puffs into the lungs 4 (four) times daily.    [name] ALPRAZolam (XANAX) 0.5 MG tablet TAKE 1 TABLET BY MOUTH TWICE A DAY AS NEEDED FOR ANXIETY (Patient taking differently: Take 0.5 mg by mouth at bedtime. ) 60 tablet 2  . b complex vitamins tablet Take 1 tablet by mouth daily.    . carisoprodol (SOMA) 350 MG tablet Take 350 mg by mouth at bedtime.     . clobetasol cream (TEMOVATE) 6.19 % Apply 1 application topically daily as needed (itching).     [name] diltiazem (CARDIZEM CD) 240 MG 24 hr capsule Take 1 capsule (240 mg total) by mouth daily. 30 capsule 1  . diphenhydrAMINE (BENADRYL) 50 MG tablet Take 1 tablet (50 mg total) by mouth every 6 (six) hours as needed for itching. (Patient not taking: Reported on 04/11/2015) 30 tablet 2  . doxepin (SINEQUAN) 25 MG capsule Take 25 mg by mouth 2 (two) times daily.    [name] doxepin (SINEQUAN) 25 MG capsule TAKE ONE CAPSULE BY MOUTH TWICE A DAY 180 capsule 1  . ferrous sulfate 325 (65 FE) MG tablet Take 325 mg by mouth daily.    . Fluticasone-Salmeterol (ADVAIR) 250-50 MCG/DOSE AEPB Inhale 2 puffs into the lungs 2 (two) times daily.     . hydrocortisone (ANUSOL-HC) 25 MG suppository Place 1 suppository (25 mg total) rectally every 12 (twelve) hours. (Patient not  taking: Reported on 03/30/2015) 12 suppository 1  . hydrocortisone 2.5 % cream Apply topically 2 (two) times daily. (Patient taking differently: Apply 1 application topically 2 (two) times daily. ) 30 g 5  . hydrOXYzine (ATARAX/VISTARIL) 25 MG tablet TAKE 1 TABLET BY MOUTH 3 TIMES A DAY FOR ITCHING (Patient taking differently: TAKE 1 TABLET BY MOUTH 3 TIMES A DAY AS NEEDED FOR ITCHING) 30 tablet 1  . ibuprofen (ADVIL,MOTRIN) 200 MG tablet Take 400 mg by mouth at bedtime as needed for mild pain (pain).     . Linaclotide (LINZESS) 145 MCG CAPS capsule Take 145 mcg by mouth daily as needed (Crampy pain).     . Loratadine 10 MG CAPS Take 10 mg by mouth daily as needed (itching).     . metFORMIN (GLUCOPHAGE) 500 MG tablet Take 1,000 mg by mouth daily with breakfast.     . methimazole (TAPAZOLE) 10 MG tablet Take 1 tablet (10 mg total) by mouth 2 (two) times daily. 60 tablet 1  . nebivolol (BYSTOLIC) 2.5 MG tablet Take 3 tablets (7.5 mg total) by mouth daily. 30 tablet 1  . omeprazole (PRILOSEC) 20 MG capsule Take 20 mg by mouth daily with supper.     . polyethylene glycol powder (GLYCOLAX/MIRALAX) powder Take 255 g by mouth daily. (Patient taking differently: Take 17 g by mouth 2 (two)  times daily as needed (constipation). Mix in 8 oz liquid and drink) 255 g 3  . potassium chloride SA (K-DUR,KLOR-CON) 20 MEQ tablet Take 2 tablets (40 mEq total) by mouth daily. 30 tablet 1  . PROAIR HFA 108 (90 BASE) MCG/ACT inhaler INHALE 2 PUFFS INTO THE LUNGS 4 TIMES A DAY 8.5 Inhaler 5  . ranolazine (RANEXA) 500 MG 12 hr tablet Take 500 mg by mouth 2 (two) times daily.    . sertraline (ZOLOFT) 100 MG tablet Take 2 tablets (200 mg total) by mouth at bedtime. (Patient taking differently: Take 200 mg by mouth daily with supper. ) 180 tablet 3  . tiZANidine (ZANAFLEX) 2 MG tablet Take 1 tablet (2 mg total) by mouth 3 (three) times daily as needed for muscle spasms. (Patient taking differently: Take 2 mg by mouth at bedtime.  ) 30 tablet 4  . Tolnaftate POWD Apply 1 application topically daily. For jock itch    . torsemide (DEMADEX) 20 MG tablet Take 2 tablets (40 mg total) by mouth 2 (two) times daily. 60 tablet 1  . traZODone (DESYREL) 100 MG tablet Take 50 mg by mouth at bedtime. For sleep  1  . warfarin (COUMADIN) 5 MG tablet TAKE AS DIRECTED BY ANTICOAGULATION CLINIC 30 tablet 3  . witch hazel-glycerin (TUCKS) pad Apply 1 application topically as needed for itching, irritation or hemorrhoids.     [name] zolpidem (AMBIEN) 10 MG tablet TAKE 1 TABLET BY MOUTH AT BEDTIME AS NEEDED FOR SLEEP (Patient taking differently: TAKE 1 TABLET BY MOUTH AT BEDTIME) 30 tablet 5  . [DISCONTINUED] atorvastatin (LIPITOR) 20 MG tablet Take 1 tablet (20 mg total) by mouth daily. 30 tablet 11  . [DISCONTINUED] gabapentin (NEURONTIN) 300 MG capsule Take 1 capsule (300 mg total) by mouth 3 (three) times daily. 90 capsule 5   No current facility-administered medications for this visit.    Functional Status: In your present state of health, do you have any difficulty performing the following activities: 04/11/2015 03/30/2015  Hearing? N N  Vision? Y N  Difficulty concentrating or making decisions? Y N  Walking or climbing stairs? Y Y  Dressing or bathing? N N  Doing errands, shopping? Y N  Preparing Food and eating ? N -  Using the Toilet? N -  In the past six months, have you accidently leaked urine? N -  Do you have problems with loss of bowel control? N -  Managing your Medications? N -  Managing your Finances? N -  Housekeeping or managing your Housekeeping? Y -    Fall/Depression Screening: PHQ 2/9 Scores 04/11/2015 02/23/2015 02/16/2014  PHQ - 2 Score 0 0 1    Assessment:  Drugs sorted by system:  Neurologic/Psychologic: alprazolam, doxepin, sertraline, tizanidine, trazodone, zolpidem  Cardiovascular: diltiazem, nebivolol, ranolazine, torsemide, warfarin  Pulmonary/Allergy: Advair, albuterol, diphenhydramine,  loratadine  Gastrointestinal: linaclotide, omeprazole, polyethylene glycol  Endocrine: metformin, methimazole  Renal: none  Topical: clobetasol cream, hydrocortisone cream, tolnaftate  Pain: carisoprodol, ibuprofen  Miscellaneous: b complex vitamins, ferrous sulfate, potassium   Duplications in therapy: none Gaps in therapy: statin therapy but patient intolerant of statins due to myalgias Medications to avoid in the elderly: patient <50 years old Drug interactions: tizanidine interacts with multiple drugs on this list but it does not appear to be clinically relevant for this patient.  Other issues noted: patient on multiple sedating medications but no issues with sedation.  Muscle/joint pain: none of these medications have a high risk of causing muscle pain  or joint pain. It could be an electrolyte imbalance or dehydration from switching to torsemide from furosemide. Also, many CNS/pain medications can cause joint pain and myalgia but none stands out over another as the culprit.  Cost of Advair: copay of $47, which is actually one of the lowest copays I have seen for it.    Plan: 1. Medication reconciliation: no issues noted that need to be changed. Would be ideal to get patient off of multiple sedating medications. Patient was unable to talk right now and asked that I call him back later today (04/12/15). 2. Medication assistance: there is no assistance available for Advair unless he has spent $600 this calendar year on prescription medications.    Nicoletta Ba, PharmD, [place] Resident [place] [phone]

## 2015-04-12 NOTE — Patient Outreach (Signed)
Phone call to [facility]. Left a message inquiring about bystolic dose ( patient is currently taking 5 mg not 7.5mg ) and to find out if Ranexa is discontinued or active. ( Patient is currently taking Ranexa 500 mg BID)   Also to notify MD that patient has gained 5 pounds since discharge ( 6 days ago).  Awaiting a call back.  Tomasa Rand, RN, BSN, CEN [facility] [employer] [phone]

## 2015-04-12 NOTE — Patient Outreach (Addendum)
[place] [facility]) Care Management  04/12/2015  [name] Oct 15, 1956 [id number]   Request from Tomasa Rand, RN to assign pharmacy and Social Work, assigned Deanne Coffer, PharmD and Nicoletta Ba, PharmD Resident and Nat Christen, LCSW.  [name]. [place], [place] Management [place] Assistant Phone: [phone] Fax: [phone]

## 2015-04-12 NOTE — Telephone Encounter (Signed)
Left detailed message on personal voicemail.  Informed [name] that per d/c instructions - stop Ranexa, take Bystolic 7.5 mg daily.  Explained that Bystolic dosage was increased while patient was in the hospital. Advised to call back if she has further questions.

## 2015-04-13 ENCOUNTER — Other Ambulatory Visit: Payer: Self-pay

## 2015-04-13 ENCOUNTER — Other Ambulatory Visit: Payer: Self-pay | Admitting: Pharmacist

## 2015-04-13 ENCOUNTER — Other Ambulatory Visit: Payer: Self-pay | Admitting: *Deleted

## 2015-04-13 DIAGNOSIS — G4733 Obstructive sleep apnea (adult) (pediatric): Secondary | ICD-10-CM | POA: Diagnosis not present

## 2015-04-13 MED ORDER — NEBIVOLOL HCL 5 MG PO TABS: 7.5000 mg | ORAL_TABLET | Freq: Every day | ORAL | Status: DC

## 2015-04-13 NOTE — Patient Outreach (Signed)
[name]  04/13/2015  [name] 1956/08/07 [id number]   I called [name] today to enroll her into [employer].  He was willing to do it over the phone.  We completed the application online and submitted it today.  He should hear from social security administration in the next 2 to 4 weeks.  He is going to call [name], [name] Assistant back when he hears from them.  We also completed the application to assist with the cost of this Advair medication. I mailed him the application for his signature and he will sign it and mail it back to me so I can fax it over to [place] access.  [name] [phone]

## 2015-04-13 NOTE — Telephone Encounter (Signed)
.  A user error has taken place: error

## 2015-04-13 NOTE — Patient Outreach (Signed)
[place] [facility]) Care Management  04/13/2015  [name] 1956-06-24 [id number]   Notification received from Tomasa Rand, RN to outreach patient for medication assistance. I had to leave a HIPPA compliant message for him to call me back.   [name] [phone]

## 2015-04-13 NOTE — Patient Outreach (Signed)
Care Coordination: Spoke with Timmie Foerster at [facility] and she will get RX called to [facility] for bystolic.  Tomasa Rand, RN, BSN, CEN [facility] [employer] [phone]

## 2015-04-13 NOTE — Patient Outreach (Signed)
[name] ( Dr. Olin Pia) nurse called and left a voicemail to state that patient should stop Ranexa and take Bystolic 7.5mg  daily.  3:50pm  I placed call and informed patient. Patient able to repeat back to me the changes in medications correctly. I will request a new RX  For bystolicto be [facility].  Tomasa Rand, RN, BSN, CEN [place] Coordinator [phone]

## 2015-04-13 NOTE — Patient Outreach (Signed)
[facility]) Care Management  [place]   04/13/2015  [name] [birth year] [id number]  Subjective: [name] is a 59 y.o. male who was referred to [facility] for medication reconciliation. Patient also has complaints of muscle pain and believes it may be related to a medication.   I called patient back but was unable to reach him.   Objective:   Current Medications: Current Outpatient Prescriptions  Medication Sig Dispense Refill  . ADVAIR DISKUS 250-50 MCG/DOSE AEPB TAKE 1 PUFF BY MOUTH DAILY AS DIRECTED 60 each 3  . albuterol (PROVENTIL HFA;VENTOLIN HFA) 108 (90 BASE) MCG/ACT inhaler Inhale 2 puffs into the lungs 4 (four) times daily.    [name] ALPRAZolam (XANAX) 0.5 MG tablet TAKE 1 TABLET BY MOUTH TWICE A DAY AS NEEDED FOR ANXIETY (Patient taking differently: Take 0.5 mg by mouth at bedtime. ) 60 tablet 2  . b complex vitamins tablet Take 1 tablet by mouth daily.    . carisoprodol (SOMA) 350 MG tablet Take 350 mg by mouth at bedtime.     . clobetasol cream (TEMOVATE) 8.12 % Apply 1 application topically daily as needed (itching).     [name] diltiazem (CARDIZEM CD) 240 MG 24 hr capsule Take 1 capsule (240 mg total) by mouth daily. 30 capsule 1  . diphenhydrAMINE (BENADRYL) 50 MG tablet Take 1 tablet (50 mg total) by mouth every 6 (six) hours as needed for itching. (Patient not taking: Reported on 04/11/2015) 30 tablet 2  . doxepin (SINEQUAN) 25 MG capsule Take 25 mg by mouth 2 (two) times daily.    [name] doxepin (SINEQUAN) 25 MG capsule TAKE ONE CAPSULE BY MOUTH TWICE A DAY 180 capsule 1  . ferrous sulfate 325 (65 FE) MG tablet Take 325 mg by mouth daily.    . Fluticasone-Salmeterol (ADVAIR) 250-50 MCG/DOSE AEPB Inhale 2 puffs into the lungs 2 (two) times daily.     . hydrocortisone (ANUSOL-HC) 25 MG suppository Place 1 suppository (25 mg total) rectally every 12 (twelve) hours. (Patient not taking: Reported on 03/30/2015) 12 suppository 1  . hydrocortisone 2.5  % cream Apply topically 2 (two) times daily. (Patient taking differently: Apply 1 application topically 2 (two) times daily. ) 30 g 5  . hydrOXYzine (ATARAX/VISTARIL) 25 MG tablet TAKE 1 TABLET BY MOUTH 3 TIMES A DAY FOR ITCHING (Patient taking differently: TAKE 1 TABLET BY MOUTH 3 TIMES A DAY AS NEEDED FOR ITCHING) 30 tablet 1  . ibuprofen (ADVIL,MOTRIN) 200 MG tablet Take 400 mg by mouth at bedtime as needed for mild pain (pain).     . Linaclotide (LINZESS) 145 MCG CAPS capsule Take 145 mcg by mouth daily as needed (Crampy pain).     . Loratadine 10 MG CAPS Take 10 mg by mouth daily as needed (itching).     . metFORMIN (GLUCOPHAGE) 500 MG tablet Take 1,000 mg by mouth daily with breakfast.     . methimazole (TAPAZOLE) 10 MG tablet Take 1 tablet (10 mg total) by mouth 2 (two) times daily. 60 tablet 1  . nebivolol (BYSTOLIC) 5 MG tablet Take 1.5 tablets (7.5 mg total) by mouth daily. 45 tablet 6  . omeprazole (PRILOSEC) 20 MG capsule Take 20 mg by mouth daily with supper.     . polyethylene glycol powder (GLYCOLAX/MIRALAX) powder Take 255 g by mouth daily. (Patient taking differently: Take 17 g by mouth 2 (two) times daily as needed (constipation). Mix in 8 oz liquid and drink) 255 g  3  . potassium chloride SA (K-DUR,KLOR-CON) 20 MEQ tablet Take 2 tablets (40 mEq total) by mouth daily. 30 tablet 1  . PROAIR HFA 108 (90 BASE) MCG/ACT inhaler INHALE 2 PUFFS INTO THE LUNGS 4 TIMES A DAY 8.5 Inhaler 5  . ranolazine (RANEXA) 500 MG 12 hr tablet Take 500 mg by mouth 2 (two) times daily.    . sertraline (ZOLOFT) 100 MG tablet Take 2 tablets (200 mg total) by mouth at bedtime. (Patient taking differently: Take 200 mg by mouth daily with supper. ) 180 tablet 3  . tiZANidine (ZANAFLEX) 2 MG tablet Take 1 tablet (2 mg total) by mouth 3 (three) times daily as needed for muscle spasms. (Patient taking differently: Take 2 mg by mouth at bedtime. ) 30 tablet 4  . Tolnaftate POWD Apply 1 application topically daily.  For jock itch    . torsemide (DEMADEX) 20 MG tablet Take 2 tablets (40 mg total) by mouth 2 (two) times daily. 60 tablet 1  . traZODone (DESYREL) 100 MG tablet Take 50 mg by mouth at bedtime. For sleep  1  . warfarin (COUMADIN) 5 MG tablet TAKE AS DIRECTED BY ANTICOAGULATION CLINIC 30 tablet 3  . witch hazel-glycerin (TUCKS) pad Apply 1 application topically as needed for itching, irritation or hemorrhoids.     [name] zolpidem (AMBIEN) 10 MG tablet TAKE 1 TABLET BY MOUTH AT BEDTIME AS NEEDED FOR SLEEP (Patient taking differently: TAKE 1 TABLET BY MOUTH AT BEDTIME) 30 tablet 5  . [DISCONTINUED] atorvastatin (LIPITOR) 20 MG tablet Take 1 tablet (20 mg total) by mouth daily. 30 tablet 11  . [DISCONTINUED] gabapentin (NEURONTIN) 300 MG capsule Take 1 capsule (300 mg total) by mouth 3 (three) times daily. 90 capsule 5   No current facility-administered medications for this visit.    Functional Status: In your present state of health, do you have any difficulty performing the following activities: 04/11/2015 03/30/2015  Hearing? N N  Vision? Y N  Difficulty concentrating or making decisions? Y N  Walking or climbing stairs? Y Y  Dressing or bathing? N N  Doing errands, shopping? Y N  Preparing Food and eating ? N -  Using the Toilet? N -  In the past six months, have you accidently leaked urine? N -  Do you have problems with loss of bowel control? N -  Managing your Medications? N -  Managing your Finances? N -  Housekeeping or managing your Housekeeping? Y -    Fall/Depression Screening: PHQ 2/9 Scores 04/11/2015 02/23/2015 02/16/2014  PHQ - 2 Score 0 0 1    Assessment:  1. Muscle/joint pain: none of these medications have a high risk of causing muscle pain or joint pain. It could be an electrolyte imbalance or dehydration from switching to torsemide from furosemide. Also, many CNS/pain medications can cause joint pain and myalgia but none stands out over another as the culprit.  2. Cost of  Advair: copay of $47, which is actually one of the lowest copays I have seen for it.    Plan: 1. Medication reconciliation: no issues noted that need to be changed. Would be ideal to get patient off of multiple sedating medications. I called the patient to discuss this and I had to leave a HIPAA compliant message for patient to return my phone call.  I will reach out on 04/14/2015 if the patient does not return my call today.  2. Medication assistance: there is no assistance available for Advair unless he has  spent $600 this calendar year on prescription medications.    Nicoletta Ba, PharmD, [place] Resident [place] [phone]

## 2015-04-14 ENCOUNTER — Telehealth: Payer: Self-pay | Admitting: Pharmacist

## 2015-04-14 ENCOUNTER — Encounter: Payer: Self-pay | Admitting: Nurse Practitioner

## 2015-04-14 ENCOUNTER — Ambulatory Visit (INDEPENDENT_AMBULATORY_CARE_PROVIDER_SITE_OTHER): Payer: Commercial Managed Care - HMO | Admitting: Nurse Practitioner

## 2015-04-14 ENCOUNTER — Encounter: Payer: Self-pay | Admitting: Internal Medicine

## 2015-04-14 ENCOUNTER — Ambulatory Visit: Payer: Commercial Managed Care - HMO | Admitting: *Deleted

## 2015-04-14 VITALS — BP 130/88 | HR 96 | Ht 70.0 in | Wt 307.0 lb

## 2015-04-14 DIAGNOSIS — I5042 Chronic combined systolic (congestive) and diastolic (congestive) heart failure: Secondary | ICD-10-CM | POA: Diagnosis not present

## 2015-04-14 DIAGNOSIS — E119 Type 2 diabetes mellitus without complications: Secondary | ICD-10-CM | POA: Diagnosis not present

## 2015-04-14 DIAGNOSIS — I429 Cardiomyopathy, unspecified: Secondary | ICD-10-CM | POA: Diagnosis not present

## 2015-04-14 DIAGNOSIS — I4819 Other persistent atrial fibrillation: Secondary | ICD-10-CM

## 2015-04-14 DIAGNOSIS — R55 Syncope and collapse: Secondary | ICD-10-CM

## 2015-04-14 DIAGNOSIS — I481 Persistent atrial fibrillation: Secondary | ICD-10-CM

## 2015-04-14 DIAGNOSIS — I480 Paroxysmal atrial fibrillation: Secondary | ICD-10-CM | POA: Insufficient documentation

## 2015-04-14 DIAGNOSIS — E785 Hyperlipidemia, unspecified: Secondary | ICD-10-CM | POA: Insufficient documentation

## 2015-04-14 LAB — CUP PACEART INCLINIC DEVICE CHECK
Date Time Interrogation Session: 20160630174255
MDC IDC SET ZONE DETECTION INTERVAL: 350 ms
Zone Setting Detection Interval: 2000 ms
Zone Setting Detection Interval: 3000 ms

## 2015-04-14 NOTE — Patient Instructions (Signed)
Medication Instructions:   Your physician recommends that you continue on your current medications as directed. Please refer to the Current Medication list given to you today.    Labwork:   BMET TODAY    Testing/Procedures:    Follow-Up:   Any Other Special Instructions Will Be Listed Below (If Applicable).

## 2015-04-14 NOTE — Patient Outreach (Signed)
[facility]) Care Management  [place]   04/14/2015  [name] [birth year] [id number]  Subjective: [name] is a 59 y.o. male who was referred to [facility] for medication reconciliation. Patient also has complaints of muscle pain and believes it may be related to a medication.   I called patient back but was unable to reach him.   Objective:   Current Medications: Current Outpatient Prescriptions  Medication Sig Dispense Refill  . ADVAIR DISKUS 250-50 MCG/DOSE AEPB TAKE 1 PUFF BY MOUTH DAILY AS DIRECTED 60 each 3  . albuterol (PROVENTIL HFA;VENTOLIN HFA) 108 (90 BASE) MCG/ACT inhaler Inhale 2 puffs into the lungs 4 (four) times daily.    [name] ALPRAZolam (XANAX) 0.5 MG tablet TAKE 1 TABLET BY MOUTH TWICE A DAY AS NEEDED FOR ANXIETY (Patient taking differently: Take 0.5 mg by mouth at bedtime. ) 60 tablet 2  . b complex vitamins tablet Take 1 tablet by mouth daily.    . carisoprodol (SOMA) 350 MG tablet Take 350 mg by mouth at bedtime.     . clobetasol cream (TEMOVATE) 0.07 % Apply 1 application topically daily as needed (itching).     [name] diltiazem (CARDIZEM CD) 240 MG 24 hr capsule Take 1 capsule (240 mg total) by mouth daily. 30 capsule 1  . diphenhydrAMINE (BENADRYL) 50 MG tablet Take 1 tablet (50 mg total) by mouth every 6 (six) hours as needed for itching. (Patient not taking: Reported on 04/11/2015) 30 tablet 2  . doxepin (SINEQUAN) 25 MG capsule Take 25 mg by mouth 2 (two) times daily.    [name] doxepin (SINEQUAN) 25 MG capsule TAKE ONE CAPSULE BY MOUTH TWICE A DAY 180 capsule 1  . ferrous sulfate 325 (65 FE) MG tablet Take 325 mg by mouth daily.    . Fluticasone-Salmeterol (ADVAIR) 250-50 MCG/DOSE AEPB Inhale 2 puffs into the lungs 2 (two) times daily.     . hydrocortisone (ANUSOL-HC) 25 MG suppository Place 1 suppository (25 mg total) rectally every 12 (twelve) hours. (Patient not taking: Reported on 03/30/2015) 12 suppository 1  . hydrocortisone 2.5  % cream Apply topically 2 (two) times daily. (Patient taking differently: Apply 1 application topically 2 (two) times daily. ) 30 g 5  . hydrOXYzine (ATARAX/VISTARIL) 25 MG tablet TAKE 1 TABLET BY MOUTH 3 TIMES A DAY FOR ITCHING (Patient taking differently: TAKE 1 TABLET BY MOUTH 3 TIMES A DAY AS NEEDED FOR ITCHING) 30 tablet 1  . ibuprofen (ADVIL,MOTRIN) 200 MG tablet Take 400 mg by mouth at bedtime as needed for mild pain (pain).     . Linaclotide (LINZESS) 145 MCG CAPS capsule Take 145 mcg by mouth daily as needed (Crampy pain).     . Loratadine 10 MG CAPS Take 10 mg by mouth daily as needed (itching).     . metFORMIN (GLUCOPHAGE) 500 MG tablet Take 1,000 mg by mouth daily with breakfast.     . methimazole (TAPAZOLE) 10 MG tablet Take 1 tablet (10 mg total) by mouth 2 (two) times daily. 60 tablet 1  . nebivolol (BYSTOLIC) 5 MG tablet Take 1.5 tablets (7.5 mg total) by mouth daily. 45 tablet 6  . omeprazole (PRILOSEC) 20 MG capsule Take 20 mg by mouth daily with supper.     . polyethylene glycol powder (GLYCOLAX/MIRALAX) powder Take 255 g by mouth daily. (Patient taking differently: Take 17 g by mouth 2 (two) times daily as needed (constipation). Mix in 8 oz liquid and drink) 255 g  3  . potassium chloride SA (K-DUR,KLOR-CON) 20 MEQ tablet Take 2 tablets (40 mEq total) by mouth daily. 30 tablet 1  . PROAIR HFA 108 (90 BASE) MCG/ACT inhaler INHALE 2 PUFFS INTO THE LUNGS 4 TIMES A DAY 8.5 Inhaler 5  . ranolazine (RANEXA) 500 MG 12 hr tablet Take 500 mg by mouth 2 (two) times daily.    . sertraline (ZOLOFT) 100 MG tablet Take 2 tablets (200 mg total) by mouth at bedtime. (Patient taking differently: Take 200 mg by mouth daily with supper. ) 180 tablet 3  . tiZANidine (ZANAFLEX) 2 MG tablet Take 1 tablet (2 mg total) by mouth 3 (three) times daily as needed for muscle spasms. (Patient taking differently: Take 2 mg by mouth at bedtime. ) 30 tablet 4  . Tolnaftate POWD Apply 1 application topically daily.  For jock itch    . torsemide (DEMADEX) 20 MG tablet Take 2 tablets (40 mg total) by mouth 2 (two) times daily. 60 tablet 1  . traZODone (DESYREL) 100 MG tablet Take 50 mg by mouth at bedtime. For sleep  1  . warfarin (COUMADIN) 5 MG tablet TAKE AS DIRECTED BY ANTICOAGULATION CLINIC 30 tablet 3  . witch hazel-glycerin (TUCKS) pad Apply 1 application topically as needed for itching, irritation or hemorrhoids.     [name] zolpidem (AMBIEN) 10 MG tablet TAKE 1 TABLET BY MOUTH AT BEDTIME AS NEEDED FOR SLEEP (Patient taking differently: TAKE 1 TABLET BY MOUTH AT BEDTIME) 30 tablet 5  . [DISCONTINUED] atorvastatin (LIPITOR) 20 MG tablet Take 1 tablet (20 mg total) by mouth daily. 30 tablet 11  . [DISCONTINUED] gabapentin (NEURONTIN) 300 MG capsule Take 1 capsule (300 mg total) by mouth 3 (three) times daily. 90 capsule 5   No current facility-administered medications for this visit.    Functional Status: In your present state of health, do you have any difficulty performing the following activities: 04/11/2015 03/30/2015  Hearing? N N  Vision? Y N  Difficulty concentrating or making decisions? Y N  Walking or climbing stairs? Y Y  Dressing or bathing? N N  Doing errands, shopping? Y N  Preparing Food and eating ? N -  Using the Toilet? N -  In the past six months, have you accidently leaked urine? N -  Do you have problems with loss of bowel control? N -  Managing your Medications? N -  Managing your Finances? N -  Housekeeping or managing your Housekeeping? Y -    Fall/Depression Screening: PHQ 2/9 Scores 04/11/2015 02/23/2015 02/16/2014  PHQ - 2 Score 0 0 1    Assessment:  1. Muscle/joint pain: none of these medications have a high risk of causing muscle pain or joint pain. It could be an electrolyte imbalance or dehydration from switching to torsemide from furosemide. Also, many CNS/pain medications can cause joint pain and myalgia but none stands out over another as the culprit.    Plan: 1.  Medication reconciliation: no issues noted that need to be changed. Would be ideal to get patient off of multiple sedating medications. I called the patient to discuss this and I had to leave a HIPAA compliant message for patient to return my phone call.  I will reach out on 04/19/2015 if the patient does not return my call today.  2. Medication assistance: patient will continue to work with [facility] for Extra Help and Advair applications.   Nicoletta Ba, PharmD, [place] Resident [place] [phone]

## 2015-04-14 NOTE — Progress Notes (Signed)
Patient Name: [name] Date of Encounter: 04/14/2015  Primary Care Provider:  [name], MD Primary Cardiologist:  Olin Pia, MD   Chief Complaint  59 year old male status post recent hospitalization related to syncope and recurrent atrial fibrillation in the setting of hyperthyroidism who presents for follow-up.  Past Medical History   Past Medical History  Diagnosis Date  . DYSKINESIA, ESOPHAGUS   . FIBROMYALGIA   . HYPERLIPIDEMIA     a. Intolerant to statins.  . INSOMNIA     takes Ambien nightly  . Rash and other nonspecific skin eruption 04/12/2009    no cause found saw dermatologists x 2 and allergist  . Paroxysmal atrial fibrillation     a. CHA2DS2VASc = 3--> takes Coumadin;  b. 03/15/2015 Successful TEE/DCCV;  c. 03/2015 recurrent afib, Amio d/c'd in setting of hyperthyroidism.  . Cardiomyopathy     a. EF 25% TEE July 2013; b. EF normalized 2015;  c. 03/2015 Echo: EF 40-45%, difrf HK, PASP 38 mmHg, Mild MR, sev LAE/RAE.  [name] Myocardial infarction     a. 2012 Myoview notable for prior infarct;  b. 03/2015 Lexiscan CL: EF 37%, diff HK, small area of inferior infarct from apex to base-->Med Rx.  . ANXIETY     takes Xanax nightly  . ASTHMA     Albuterol prn and Advair daily;also takes Prednisone daily  . GERD (gastroesophageal reflux disease)        . Type II diabetes mellitus        . Essential hypertension        . COPD (chronic obstructive pulmonary disease)   . O2 dependent     uses oxygen 2 and 1/2 liters per [place] at hs  . SLEEP APNEA, OBSTRUCTIVE     a. doesn't use CPAP  . Peripheral neuropathy   . Arthritis   . Chronic constipation     takes OTC stool softener  . Diverticulitis   . Glaucoma   . DEPRESSION     takes Zoloft and Doxepin daily  . Anemia     supposed to be taking Vit B but doesn't  . Syncope     a. 03/2015 s/p MDT LINQ.   Past Surgical History  Procedure Laterality Date  . Tonsillectomy    . Acne cyst removal      2 on back   .  Cardioversion  04/18/2012    Procedure: CARDIOVERSION;  Surgeon: Fay Records, MD;  Location: [place];  Service: Cardiovascular;  Laterality: N/A;  . Tee without cardioversion  04/25/2012    Procedure: TRANSESOPHAGEAL ECHOCARDIOGRAM (TEE);  Surgeon: Thayer Headings, MD;  Location: [place];  Service: Cardiovascular;  Laterality: N/A;  . Cardioversion  04/25/2012    Procedure: CARDIOVERSION;  Surgeon: Thayer Headings, MD;  Location: [facility] ENDOSCOPY;  Service: Cardiovascular;  Laterality: N/A;  . Cardioversion  04/25/2012    Procedure: CARDIOVERSION;  Surgeon: Fay Records, MD;  Location: [place];  Service: Cardiovascular;  Laterality: N/A;  . Cardioversion  05/09/2012    Procedure: CARDIOVERSION;  Surgeon: Sherren Mocha, MD;  Location: [place];  Service: Cardiovascular;  Laterality: N/A;  changed from crenshaw to cooper by trish/leone-endo  . Colonoscopy    . Esophagogastroduodenoscopy    . Total knee arthroplasty Right 06/15/2014    Procedure: TOTAL KNEE ARTHROPLASTY;  Surgeon: Renette Butters, MD;  Location: [place];  Service: Orthopedics;  Laterality: Right;  . Colonoscopy with propofol N/A 10/21/2014    Procedure: COLONOSCOPY WITH  PROPOFOL;  Surgeon: Ladene Artist, MD;  Location: [facility];  Service: Endoscopy;  Laterality: N/A;  . Tee without cardioversion N/A 03/15/2015    Procedure: TRANSESOPHAGEAL ECHOCARDIOGRAM (TEE);  Surgeon: Thayer Headings, MD;  Location: [place];  Service: Cardiovascular;  Laterality: N/A;  . Cardioversion N/A 03/15/2015    Procedure: CARDIOVERSION;  Surgeon: Thayer Headings, MD;  Location: [place];  Service: Cardiovascular;  Laterality: N/A;  . Ep implantable device N/A 04/06/2015    Procedure: Loop Recorder Insertion;  Surgeon: Evans Lance, MD;  Location: [place] CV LAB;  Service: Cardiovascular;  Laterality: N/A;    Allergies  Allergies  Allergen Reactions  . Pravastatin Sodium Other (See Comments)    myalgia  . Simvastatin Other (See Comments)     severe leg cramps  . Statins Other (See Comments)    myalgia  . Tape Other (See Comments)    Skin Tears Use Paper Tape Only    HPI  59 year old male with the above complex problem list. He has a history of paroxysmal atrial fibrillation and has been managed with amiodarone and Coumadin therapy. He also has a history of chronic combined systolic and diastolic heart failure in the setting of morbid obesity and sleep apnea. Most recent EF was 40-45% earlier this month. In May, he was found to have a suppressed TSH of 0.01.  This was felt to be most likely secondary to amiodarone therapy.  In that setting, he developed recurrent afib and was seen in late May by Dr. [name] @ which time his amio was increased to 200 mg daily in an effort to increase his amio level and get him to TEE and cardioversion.  He underwent TEE/DCCV on 5/31, which was initially successfuly, but converted back to atrial fibrillation within 1 day. He was subsequently seen in atrial fibrillation clinic on 6/8 and was noted to be in afib with rates into the 180's with activity.  His BB was switched from metoprolol to bystolic, dilt was added, and digoxin was d/c'd.  It was hoped that he could undergo repeat DCCV however on 6/15, he experienced a syncopal episode while walking from his car.  He presented to the [facility] ED.  There, he was noted to be volume overloaded.  He was admitted for diuresis (diuresed ~ 25 lbs).  Echo showed EF of 40-45%, which was down from recent TEE.  He was seen by EP and given amio induced thyrotoxicosis, his amio was d/c'd.  A myoview was undertaken during hospitalization and revealed prior inferior infarct w/o active ischemia.  In setting of syncope, EP recommended ILR, and a Linq event recorder was placed prior to discharge.    Since d/c, he has felt fatigued.  His wt has been stable @ 302 lbs on his home scale.  He does have DOE but denies c/p, pnd, orthopnea, n, v, or recurrent syncope.  His lower ext edema is  much improved.  He has had some orthostatic LH, but can avoid this by getting up more slowly.  Home Medications  Prior to Admission medications   Medication Sig Start Date End Date Taking? Authorizing Provider  ADVAIR DISKUS 250-50 MCG/DOSE AEPB TAKE 1 PUFF BY MOUTH DAILY AS DIRECTED 04/12/15  Yes [name], MD  ALPRAZolam Duanne Moron) 0.5 MG tablet TAKE 1 TABLET BY MOUTH TWICE A DAY AS NEEDED FOR ANXIETY Patient taking differently: Take 0.5 mg by mouth at bedtime.  02/03/15  Yes [name], MD  b complex vitamins  tablet Take 1 tablet by mouth daily.   Yes Historical Provider, MD  carisoprodol (SOMA) 350 MG tablet Take 350 mg by mouth at bedtime.    Yes Historical Provider, MD  clobetasol cream (TEMOVATE) 5.18 % Apply 1 application topically daily as needed (itching).    Yes Historical Provider, MD  diltiazem (CARDIZEM CD) 240 MG 24 hr capsule Take 1 capsule (240 mg total) by mouth daily. 03/23/15  Yes Sherran Needs, NP  diphenhydrAMINE (BENADRYL) 50 MG tablet Take 1 tablet (50 mg total) by mouth every 6 (six) hours as needed for itching. 12/29/14  Yes [name], MD  doxepin (SINEQUAN) 25 MG capsule Take 25 mg by mouth 2 (two) times daily.   Yes Historical Provider, MD  ferrous sulfate 325 (65 FE) MG tablet Take 325 mg by mouth daily.   Yes Historical Provider, MD  hydrocortisone (ANUSOL-HC) 25 MG suppository Place 1 suppository (25 mg total) rectally every 12 (twelve) hours. 08/20/14  Yes [name], MD  hydrocortisone 2.5 % cream Apply topically 2 (two) times daily. Patient taking differently: Apply 1 application topically 2 (two) times daily.  02/23/15  Yes [name], MD  hydrOXYzine (ATARAX/VISTARIL) 25 MG tablet TAKE 1 TABLET BY MOUTH 3 TIMES A DAY FOR ITCHING Patient taking differently: TAKE 1 TABLET BY MOUTH 3 TIMES A DAY AS NEEDED FOR ITCHING 12/31/14  Yes [name], MD  ibuprofen (ADVIL,MOTRIN) 200 MG tablet Take 400 mg by mouth at bedtime as needed for mild pain (pain).  09/15/14   Yes Historical Provider, MD  Linaclotide Rolan Lipa) 145 MCG CAPS capsule Take 145 mcg by mouth daily as needed (Crampy pain).    Yes Historical Provider, MD  Loratadine 10 MG CAPS Take 10 mg by mouth daily as needed (itching).  09/15/14  Yes Historical Provider, MD  metFORMIN (GLUCOPHAGE) 500 MG tablet Take 1,000 mg by mouth daily with breakfast.    Yes Historical Provider, MD  methimazole (TAPAZOLE) 10 MG tablet Take 1 tablet (10 mg total) by mouth 2 (two) times daily. 04/06/15  Yes Amber Sena Slate, NP  nebivolol (BYSTOLIC) 5 MG tablet Take 1 tablet (5 mg total) by mouth daily. 04/13/15  Yes Deboraha Sprang, MD  omeprazole (PRILOSEC) 20 MG capsule Take 20 mg by mouth daily with supper.    Yes Historical Provider, MD  polyethylene glycol powder (GLYCOLAX/MIRALAX) powder Take 255 g by mouth daily. Patient taking differently: Take 17 g by mouth 2 (two) times daily as needed (constipation). Mix in 8 oz liquid and drink 01/04/15  Yes [name], MD  potassium chloride SA (K-DUR,KLOR-CON) 20 MEQ tablet Take 2 tablets (40 mEq total) by mouth daily. 04/06/15  Yes Amber Sena Slate, NP  PROAIR HFA 108 (90 BASE) MCG/ACT inhaler INHALE 2 PUFFS INTO THE LUNGS 4 TIMES A DAY 02/23/15  Yes [name], MD  sertraline (ZOLOFT) 100 MG tablet Take 2 tablets (200 mg total) by mouth at bedtime. Patient taking differently: Take 200 mg by mouth daily with supper.  01/04/15  Yes [name], MD  tiZANidine (ZANAFLEX) 2 MG tablet Take 1 tablet (2 mg total) by mouth 3 (three) times daily as needed for muscle spasms. Patient taking differently: Take 2 mg by mouth at bedtime.  11/24/14  Yes Meredith Staggers, MD  Tolnaftate POWD Apply 1 application topically daily. For jock itch 07/23/14  Yes Historical Provider, MD  torsemide (DEMADEX) 20 MG tablet Take 2 tablets (40 mg total) by  mouth 2 (two) times daily. 04/06/15  Yes Amber Sena Slate, NP  traZODone (DESYREL) 100 MG tablet Take 50 mg by mouth at bedtime. For sleep 02/15/15  Yes Historical  Provider, MD  warfarin (COUMADIN) 5 MG tablet TAKE AS DIRECTED BY ANTICOAGULATION CLINIC 04/12/15  Yes [name], MD  witch hazel-glycerin (TUCKS) pad Apply 1 application topically as needed for itching, irritation or hemorrhoids.  09/15/14  Yes Historical Provider, MD  zolpidem (AMBIEN) 10 MG tablet TAKE 1 TABLET BY MOUTH AT BEDTIME AS NEEDED FOR SLEEP Patient taking differently: TAKE 1 TABLET BY MOUTH AT BEDTIME 11/19/14  Yes [name], MD    Review of Systems  Fatigue and DOE persist as noted above.  He denies chest pain, palpitations, pnd, orthopnea, n, v, syncope, edema, weight gain, or early satiety.  All other systems reviewed and are otherwise negative except as noted above.  Physical Exam  VS:  BP 130/88 mmHg  BP 102/62 on repeat; Pulse 96  Ht 5\' 10"  (1.778 m)  Wt 307 lb (139.254 kg)  BMI 44.05 kg/m2 , BMI Body mass index is 44.05 kg/(m^2). GEN: Well nourished, obese, in no acute distress. HEENT: normal. Neck: Supple, obese, difficult to gauge JVD.  No carotid bruits, or masses. Cardiac: IR, IR, no murmurs, rubs, or gallops. No clubbing, cyanosis, trace bilat LE edema with chronic venous stasis changes.  Radials/DP/PT 1+ and equal bilaterally.  Respiratory:  Respirations regular and unlabored, clear to auscultation bilaterally. GI: Soft, nontender, nondistended, BS + x 4. MS: no deformity or atrophy. Skin: warm and dry, no rash. Neuro:  Strength and sensation are intact. Psych: Normal affect.  Accessory Clinical Findings  ECG - afib, 96, rbbb, lad, lafb (bifasicular block).  Assessment & Plan  1.  Persistent Afib:  Pt presents following recent hospitalization for chf in the setting of persistent afib and amio induced thyrotoxicosis.  Amio has been d/c'd and he has been managed with dilt/bystolic.  He has been experiencing fatigue and DOE but his volume has been stable.  HR is reasonably controlled today on bystolic and dilt.  Unfortunately, his BP on recheck is 355  systolic and he has a h/o orthostasis along with syncope, thus preventing me from going up further on his bb.  He remains on coumadin and his INR is followed by primary care.  He understands that his afib will be difficult to manage in the short-term, given hyperthyroidism.  Previous EP notes recommend tikosyn after 3 mos of amio washout.  He has f/u in Afib clinic on 7/18.  Could consider reintroduction of digoxin given reduced EF on repeat echo if rates poorly controlled on f/u.  2.  Chronic combined systolic/diastolic CHF:  Volume/weight stable.  I will check a bmet today.  As above, BP/HR reasonable.  Cont current dose of torsemide and f/u bmet today.  3.  Syncope:  S/p LINQ event monitor.  Device clinic to see today.  4.  Morbid obesity:  Activity is quite limited at this time and thus intentional weight loss through increased exercise is clearly on the back burner.  5.  DM II:  On metformin per PCP.  6.  Dispo:  F/u bmet today.  F/u in Afib clinic on 7/18.   Murray Hodgkins, NP 04/14/2015, 4:20 PM

## 2015-04-15 LAB — BASIC METABOLIC PANEL
BUN: 30 mg/dL — ABNORMAL HIGH (ref 6–23)
CO2: 26 mEq/L (ref 19–32)
Calcium: 9.7 mg/dL (ref 8.4–10.5)
Chloride: 102 mEq/L (ref 96–112)
Creatinine, Ser: 0.96 mg/dL (ref 0.40–1.50)
GFR: 84.83 mL/min (ref >60.00)
Glucose, Bld: 82 mg/dL (ref 70–99)
Potassium: 4.4 mEq/L (ref 3.5–5.1)
Sodium: 138 mEq/L (ref 135–145)

## 2015-04-18 NOTE — Progress Notes (Signed)
I have reviewed and agree with the plan. 

## 2015-04-19 ENCOUNTER — Other Ambulatory Visit: Payer: Self-pay

## 2015-04-19 ENCOUNTER — Other Ambulatory Visit: Payer: Self-pay | Admitting: Pharmacist

## 2015-04-19 ENCOUNTER — Ambulatory Visit (INDEPENDENT_AMBULATORY_CARE_PROVIDER_SITE_OTHER): Payer: Commercial Managed Care - HMO | Admitting: General Practice

## 2015-04-19 ENCOUNTER — Telehealth: Payer: Self-pay | Admitting: Pharmacist

## 2015-04-19 DIAGNOSIS — I4891 Unspecified atrial fibrillation: Secondary | ICD-10-CM

## 2015-04-19 DIAGNOSIS — Z5181 Encounter for therapeutic drug level monitoring: Secondary | ICD-10-CM | POA: Diagnosis not present

## 2015-04-19 LAB — POCT INR: INR: 4.7

## 2015-04-19 NOTE — Patient Outreach (Signed)
[facility]) Care Management  [name]   04/19/2015  [name] Nov 16, 1955 [id number]  Subjective: [name] is a 59 y.o. male who was referred to [place] for medication reconciliation. Patient also has complaints of muscle pain and believes it may be related to a medication.   Patient called me back and reported that he assumes he has arthritis. He has joint pain in multiple places, including his legs and back. He has had physicians examine it but they have not found anything according to his report.    Objective:   Current Medications: Current Outpatient Prescriptions  Medication Sig Dispense Refill  . ADVAIR DISKUS 250-50 MCG/DOSE AEPB TAKE 1 PUFF BY MOUTH DAILY AS DIRECTED 60 each 3  . ALPRAZolam (XANAX) 0.5 MG tablet TAKE 1 TABLET BY MOUTH TWICE A DAY AS NEEDED FOR ANXIETY (Patient taking differently: Take 0.5 mg by mouth at bedtime. ) 60 tablet 2  . b complex vitamins tablet Take 1 tablet by mouth daily.    . carisoprodol (SOMA) 350 MG tablet Take 350 mg by mouth at bedtime.     . clobetasol cream (TEMOVATE) 4.09 % Apply 1 application topically daily as needed (itching).     [name] diltiazem (CARDIZEM CD) 240 MG 24 hr capsule Take 1 capsule (240 mg total) by mouth daily. 30 capsule 1  . diphenhydrAMINE (BENADRYL) 50 MG tablet Take 1 tablet (50 mg total) by mouth every 6 (six) hours as needed for itching. 30 tablet 2  . doxepin (SINEQUAN) 25 MG capsule Take 25 mg by mouth 2 (two) times daily.    . ferrous sulfate 325 (65 FE) MG tablet Take 325 mg by mouth daily.    . hydrocortisone (ANUSOL-HC) 25 MG suppository Place 1 suppository (25 mg total) rectally every 12 (twelve) hours. 12 suppository 1  . hydrocortisone 2.5 % cream Apply topically 2 (two) times daily. (Patient taking differently: Apply 1 application topically 2 (two) times daily. ) 30 g 5  . hydrOXYzine (ATARAX/VISTARIL) 25 MG tablet TAKE 1 TABLET BY MOUTH 3 TIMES A DAY FOR ITCHING (Patient  taking differently: TAKE 1 TABLET BY MOUTH 3 TIMES A DAY AS NEEDED FOR ITCHING) 30 tablet 1  . ibuprofen (ADVIL,MOTRIN) 200 MG tablet Take 400 mg by mouth at bedtime as needed for mild pain (pain).     . Linaclotide (LINZESS) 145 MCG CAPS capsule Take 145 mcg by mouth daily as needed (Crampy pain).     . Loratadine 10 MG CAPS Take 10 mg by mouth daily as needed (itching).     . metFORMIN (GLUCOPHAGE) 500 MG tablet Take 1,000 mg by mouth daily with breakfast.     . methimazole (TAPAZOLE) 10 MG tablet Take 1 tablet (10 mg total) by mouth 2 (two) times daily. 60 tablet 1  . nebivolol (BYSTOLIC) 5 MG tablet Take 1.5 tablets (7.5 mg total) by mouth daily. 45 tablet 6  . omeprazole (PRILOSEC) 20 MG capsule Take 20 mg by mouth daily with supper.     . polyethylene glycol powder (GLYCOLAX/MIRALAX) powder Take 255 g by mouth daily. (Patient taking differently: Take 17 g by mouth 2 (two) times daily as needed (constipation). Mix in 8 oz liquid and drink) 255 g 3  . potassium chloride SA (K-DUR,KLOR-CON) 20 MEQ tablet Take 2 tablets (40 mEq total) by mouth daily. 30 tablet 1  . PROAIR HFA 108 (90 BASE) MCG/ACT inhaler INHALE 2 PUFFS INTO THE LUNGS 4 TIMES A DAY 8.5  Inhaler 5  . ranolazine (RANEXA) 500 MG 12 hr tablet Take 500 mg by mouth 2 (two) times daily.    . sertraline (ZOLOFT) 100 MG tablet Take 2 tablets (200 mg total) by mouth at bedtime. (Patient taking differently: Take 200 mg by mouth daily with supper. ) 180 tablet 3  . tiZANidine (ZANAFLEX) 2 MG tablet Take 1 tablet (2 mg total) by mouth 3 (three) times daily as needed for muscle spasms. (Patient taking differently: Take 2 mg by mouth at bedtime. ) 30 tablet 4  . Tolnaftate POWD Apply 1 application topically daily. For jock itch    . torsemide (DEMADEX) 20 MG tablet Take 2 tablets (40 mg total) by mouth 2 (two) times daily. 60 tablet 1  . traZODone (DESYREL) 100 MG tablet Take 50 mg by mouth at bedtime. For sleep  1  . warfarin (COUMADIN) 5 MG  tablet TAKE AS DIRECTED BY ANTICOAGULATION CLINIC 30 tablet 3  . witch hazel-glycerin (TUCKS) pad Apply 1 application topically as needed for itching, irritation or hemorrhoids.     [name] zolpidem (AMBIEN) 10 MG tablet TAKE 1 TABLET BY MOUTH AT BEDTIME AS NEEDED FOR SLEEP (Patient taking differently: TAKE 1 TABLET BY MOUTH AT BEDTIME) 30 tablet 5  . [DISCONTINUED] atorvastatin (LIPITOR) 20 MG tablet Take 1 tablet (20 mg total) by mouth daily. 30 tablet 11  . [DISCONTINUED] gabapentin (NEURONTIN) 300 MG capsule Take 1 capsule (300 mg total) by mouth 3 (three) times daily. 90 capsule 5   No current facility-administered medications for this visit.    Functional Status: In your present state of health, do you have any difficulty performing the following activities: 04/11/2015 03/30/2015  Hearing? N N  Vision? Y N  Difficulty concentrating or making decisions? Y N  Walking or climbing stairs? Y Y  Dressing or bathing? N N  Doing errands, shopping? Y N  Preparing Food and eating ? N -  Using the Toilet? N -  In the past six months, have you accidently leaked urine? N -  Do you have problems with loss of bowel control? N -  Managing your Medications? N -  Managing your Finances? N -  Housekeeping or managing your Housekeeping? Y -    Fall/Depression Screening: PHQ 2/9 Scores 04/11/2015 02/23/2015 02/16/2014  PHQ - 2 Score 0 0 1    Assessment:  1. Muscle/joint pain: patient attributes joint pain to arthritis. I do not believe it is associated with any of the medications that he is taking.     Plan: 1. Muscle/joint pain: patient understands that this is likely arthritis. He verbalized understanding that I reviewed his medications and that I didn't find any that would likely cause joint pain. He denied any need for other pharmacy help with the exception of the medication assistance that he is working on with [employer], [employer], manufacturing. Patient closed out of pharmacy program.     Nicoletta Ba, PharmD, [place] Resident [place] [phone]

## 2015-04-19 NOTE — Progress Notes (Signed)
I have reviewed and agree with the plan. 

## 2015-04-19 NOTE — Progress Notes (Signed)
Pre visit review using our clinic review tool, if applicable. No additional management support is needed unless otherwise documented below in the visit note. 

## 2015-04-19 NOTE — Patient Outreach (Signed)
Transition of care call: Placed call to patient. Unsuccessful. Left a HIPPA approved message requesting a call back from patient.   Tomasa Rand, RN, BSN, CEN [facility] [employer] [phone]

## 2015-04-19 NOTE — Patient Outreach (Signed)
[facility]) Care Management  [place]   04/19/2015  [name] Feb 16, 1956 [id number]  Subjective: [name] is a 59 y.o. male who was referred to [facility] for medication reconciliation. Patient also has complaints of muscle pain and believes it may be related to a medication.   I called patient back but was unable to reach him.   Objective:   Current Medications: Current Outpatient Prescriptions  Medication Sig Dispense Refill  . ADVAIR DISKUS 250-50 MCG/DOSE AEPB TAKE 1 PUFF BY MOUTH DAILY AS DIRECTED 60 each 3  . ALPRAZolam (XANAX) 0.5 MG tablet TAKE 1 TABLET BY MOUTH TWICE A DAY AS NEEDED FOR ANXIETY (Patient taking differently: Take 0.5 mg by mouth at bedtime. ) 60 tablet 2  . b complex vitamins tablet Take 1 tablet by mouth daily.    . carisoprodol (SOMA) 350 MG tablet Take 350 mg by mouth at bedtime.     . clobetasol cream (TEMOVATE) 0.93 % Apply 1 application topically daily as needed (itching).     [name] diltiazem (CARDIZEM CD) 240 MG 24 hr capsule Take 1 capsule (240 mg total) by mouth daily. 30 capsule 1  . diphenhydrAMINE (BENADRYL) 50 MG tablet Take 1 tablet (50 mg total) by mouth every 6 (six) hours as needed for itching. 30 tablet 2  . doxepin (SINEQUAN) 25 MG capsule Take 25 mg by mouth 2 (two) times daily.    . ferrous sulfate 325 (65 FE) MG tablet Take 325 mg by mouth daily.    . hydrocortisone (ANUSOL-HC) 25 MG suppository Place 1 suppository (25 mg total) rectally every 12 (twelve) hours. 12 suppository 1  . hydrocortisone 2.5 % cream Apply topically 2 (two) times daily. (Patient taking differently: Apply 1 application topically 2 (two) times daily. ) 30 g 5  . hydrOXYzine (ATARAX/VISTARIL) 25 MG tablet TAKE 1 TABLET BY MOUTH 3 TIMES A DAY FOR ITCHING (Patient taking differently: TAKE 1 TABLET BY MOUTH 3 TIMES A DAY AS NEEDED FOR ITCHING) 30 tablet 1  . ibuprofen (ADVIL,MOTRIN) 200 MG tablet Take 400 mg by mouth at bedtime as needed  for mild pain (pain).     . Linaclotide (LINZESS) 145 MCG CAPS capsule Take 145 mcg by mouth daily as needed (Crampy pain).     . Loratadine 10 MG CAPS Take 10 mg by mouth daily as needed (itching).     . metFORMIN (GLUCOPHAGE) 500 MG tablet Take 1,000 mg by mouth daily with breakfast.     . methimazole (TAPAZOLE) 10 MG tablet Take 1 tablet (10 mg total) by mouth 2 (two) times daily. 60 tablet 1  . nebivolol (BYSTOLIC) 5 MG tablet Take 1.5 tablets (7.5 mg total) by mouth daily. 45 tablet 6  . omeprazole (PRILOSEC) 20 MG capsule Take 20 mg by mouth daily with supper.     . polyethylene glycol powder (GLYCOLAX/MIRALAX) powder Take 255 g by mouth daily. (Patient taking differently: Take 17 g by mouth 2 (two) times daily as needed (constipation). Mix in 8 oz liquid and drink) 255 g 3  . potassium chloride SA (K-DUR,KLOR-CON) 20 MEQ tablet Take 2 tablets (40 mEq total) by mouth daily. 30 tablet 1  . PROAIR HFA 108 (90 BASE) MCG/ACT inhaler INHALE 2 PUFFS INTO THE LUNGS 4 TIMES A DAY 8.5 Inhaler 5  . ranolazine (RANEXA) 500 MG 12 hr tablet Take 500 mg by mouth 2 (two) times daily.    . sertraline (ZOLOFT) 100 MG tablet Take 2  tablets (200 mg total) by mouth at bedtime. (Patient taking differently: Take 200 mg by mouth daily with supper. ) 180 tablet 3  . tiZANidine (ZANAFLEX) 2 MG tablet Take 1 tablet (2 mg total) by mouth 3 (three) times daily as needed for muscle spasms. (Patient taking differently: Take 2 mg by mouth at bedtime. ) 30 tablet 4  . Tolnaftate POWD Apply 1 application topically daily. For jock itch    . torsemide (DEMADEX) 20 MG tablet Take 2 tablets (40 mg total) by mouth 2 (two) times daily. 60 tablet 1  . traZODone (DESYREL) 100 MG tablet Take 50 mg by mouth at bedtime. For sleep  1  . warfarin (COUMADIN) 5 MG tablet TAKE AS DIRECTED BY ANTICOAGULATION CLINIC 30 tablet 3  . witch hazel-glycerin (TUCKS) pad Apply 1 application topically as needed for itching, irritation or hemorrhoids.      [name] zolpidem (AMBIEN) 10 MG tablet TAKE 1 TABLET BY MOUTH AT BEDTIME AS NEEDED FOR SLEEP (Patient taking differently: TAKE 1 TABLET BY MOUTH AT BEDTIME) 30 tablet 5  . [DISCONTINUED] atorvastatin (LIPITOR) 20 MG tablet Take 1 tablet (20 mg total) by mouth daily. 30 tablet 11  . [DISCONTINUED] gabapentin (NEURONTIN) 300 MG capsule Take 1 capsule (300 mg total) by mouth 3 (three) times daily. 90 capsule 5   No current facility-administered medications for this visit.    Functional Status: In your present state of health, do you have any difficulty performing the following activities: 04/11/2015 03/30/2015  Hearing? N N  Vision? Y N  Difficulty concentrating or making decisions? Y N  Walking or climbing stairs? Y Y  Dressing or bathing? N N  Doing errands, shopping? Y N  Preparing Food and eating ? N -  Using the Toilet? N -  In the past six months, have you accidently leaked urine? N -  Do you have problems with loss of bowel control? N -  Managing your Medications? N -  Managing your Finances? N -  Housekeeping or managing your Housekeeping? Y -    Fall/Depression Screening: PHQ 2/9 Scores 04/11/2015 02/23/2015 02/16/2014  PHQ - 2 Score 0 0 1    Assessment:  1. Muscle/joint pain: none of these medications have a high risk of causing muscle pain or joint pain. Electrolytes normal from [facility] 04/14/15.  Many CNS/pain medications can cause joint pain and myalgia but none stands out over another as the culprit.    Plan: 1. Medication reconciliation: no issues noted that need to be changed. Would be ideal to get patient off of multiple sedating medications. I called the patient to discuss this and I had to leave a HIPAA compliant message for patient to return my phone call.  This is my third outreach attempt and I have not been able to reach him. I will send a letter for failure to contact patient and close out of the pharmacy program. I will also send a copy of the letter to his primary care  provider, Dr. [name].    2. Medication assistance: patient will continue to work with Alexis Frock Rh[facility] for Extra Help and Advair applications.   Nicoletta Ba, PharmD, [place] [facility] [place] [phone]

## 2015-04-19 NOTE — Patient Outreach (Signed)
Transition of care call: Patient returned call. Reports that he is no energy and continues to have leg pain.   Reports weight today of 306.2 pounds. Patient reports that his scale was off about 5 pounds from the cardiologist office and so he has adjusted his scale.  Patient denies weight gain.     Patient reports that he received another call last week from Cardiologist office and was told to continue his bystolic at 5mg  instead of increasing to 7.5mg .     Patient reports that he spoke with [facility] pharmacy. Reports that he got paperwork in the mail to complete for medication assistance.  Denies any new problems or concerns today.  Suggested to patient to avoid the heat of the day by making appoinments earlier in the day.  Reviewed with patient to take heat precautions.   Plan: Will continue to contact weekly for transition of care calls.

## 2015-04-25 ENCOUNTER — Encounter: Payer: Self-pay | Admitting: *Deleted

## 2015-04-25 ENCOUNTER — Other Ambulatory Visit: Payer: Self-pay | Admitting: *Deleted

## 2015-04-25 NOTE — Patient Outreach (Signed)
[facility]) Care Management  04/25/2015  [name] 1956-04-12 [id number]   Telephone outreach to patient to inform him I was still missing information to send off his prescription savings program application to [facility] access. Patient informed me he will get the requested information to me in the mail as soon as he gathers all information.  Doreena Maulden L. Elbert Ewings [facility] Care Management Assistant [phone]

## 2015-04-25 NOTE — Patient Outreach (Signed)
[facility]) Care Management  [facility] Social Work  04/25/2015  WOODFORD STREGE [birth year] [id number]    Current Medications:  Current Outpatient Prescriptions  Medication Sig Dispense Refill  . ADVAIR DISKUS 250-50 MCG/DOSE AEPB TAKE 1 PUFF BY MOUTH DAILY AS DIRECTED 60 each 3  . ALPRAZolam (XANAX) 0.5 MG tablet TAKE 1 TABLET BY MOUTH TWICE A DAY AS NEEDED FOR ANXIETY (Patient taking differently: Take 0.5 mg by mouth at bedtime. ) 60 tablet 2  . b complex vitamins tablet Take 1 tablet by mouth daily.    . carisoprodol (SOMA) 350 MG tablet Take 350 mg by mouth at bedtime.     . clobetasol cream (TEMOVATE) 1.66 % Apply 1 application topically daily as needed (itching).     [name] diltiazem (CARDIZEM CD) 240 MG 24 hr capsule Take 1 capsule (240 mg total) by mouth daily. 30 capsule 1  . diphenhydrAMINE (BENADRYL) 50 MG tablet Take 1 tablet (50 mg total) by mouth every 6 (six) hours as needed for itching. 30 tablet 2  . doxepin (SINEQUAN) 25 MG capsule Take 25 mg by mouth 2 (two) times daily.    . ferrous sulfate 325 (65 FE) MG tablet Take 325 mg by mouth daily.    . hydrocortisone (ANUSOL-HC) 25 MG suppository Place 1 suppository (25 mg total) rectally every 12 (twelve) hours. 12 suppository 1  . hydrocortisone 2.5 % cream Apply topically 2 (two) times daily. (Patient taking differently: Apply 1 application topically 2 (two) times daily. ) 30 g 5  . hydrOXYzine (ATARAX/VISTARIL) 25 MG tablet TAKE 1 TABLET BY MOUTH 3 TIMES A DAY FOR ITCHING (Patient taking differently: TAKE 1 TABLET BY MOUTH 3 TIMES A DAY AS NEEDED FOR ITCHING) 30 tablet 1  . ibuprofen (ADVIL,MOTRIN) 200 MG tablet Take 400 mg by mouth at bedtime as needed for mild pain (pain).     . Linaclotide (LINZESS) 145 MCG CAPS capsule Take 145 mcg by mouth daily as needed (Crampy pain).     . Loratadine 10 MG CAPS Take 10 mg by mouth daily as needed (itching).     . metFORMIN (GLUCOPHAGE) 500 MG tablet Take 1,000 mg by mouth  daily with breakfast.     . methimazole (TAPAZOLE) 10 MG tablet Take 1 tablet (10 mg total) by mouth 2 (two) times daily. 60 tablet 1  . nebivolol (BYSTOLIC) 5 MG tablet Take 1.5 tablets (7.5 mg total) by mouth daily. 45 tablet 6  . omeprazole (PRILOSEC) 20 MG capsule Take 20 mg by mouth daily with supper.     . polyethylene glycol powder (GLYCOLAX/MIRALAX) powder Take 255 g by mouth daily. (Patient taking differently: Take 17 g by mouth 2 (two) times daily as needed (constipation). Mix in 8 oz liquid and drink) 255 g 3  . potassium chloride SA (K-DUR,KLOR-CON) 20 MEQ tablet Take 2 tablets (40 mEq total) by mouth daily. 30 tablet 1  . PROAIR HFA 108 (90 BASE) MCG/ACT inhaler INHALE 2 PUFFS INTO THE LUNGS 4 TIMES A DAY 8.5 Inhaler 5  . ranolazine (RANEXA) 500 MG 12 hr tablet Take 500 mg by mouth 2 (two) times daily.    . sertraline (ZOLOFT) 100 MG tablet Take 2 tablets (200 mg total) by mouth at bedtime. (Patient taking differently: Take 200 mg by mouth daily with supper. ) 180 tablet 3  . tiZANidine (ZANAFLEX) 2 MG tablet Take 1 tablet (2 mg total) by mouth 3 (three) times daily as needed for muscle spasms. (Patient taking differently:  Take 2 mg by mouth at bedtime. ) 30 tablet 4  . Tolnaftate POWD Apply 1 application topically daily. For jock itch    . torsemide (DEMADEX) 20 MG tablet Take 2 tablets (40 mg total) by mouth 2 (two) times daily. 60 tablet 1  . traZODone (DESYREL) 100 MG tablet Take 50 mg by mouth at bedtime. For sleep  1  . warfarin (COUMADIN) 5 MG tablet TAKE AS DIRECTED BY ANTICOAGULATION CLINIC 30 tablet 3  . witch hazel-glycerin (TUCKS) pad Apply 1 application topically as needed for itching, irritation or hemorrhoids.     [name] zolpidem (AMBIEN) 10 MG tablet TAKE 1 TABLET BY MOUTH AT BEDTIME AS NEEDED FOR SLEEP (Patient taking differently: TAKE 1 TABLET BY MOUTH AT BEDTIME) 30 tablet 5  . [DISCONTINUED] atorvastatin (LIPITOR) 20 MG tablet Take 1 tablet (20 mg total) by mouth daily. 30  tablet 11  . [DISCONTINUED] gabapentin (NEURONTIN) 300 MG capsule Take 1 capsule (300 mg total) by mouth 3 (three) times daily. 90 capsule 5   No current facility-administered medications for this visit.    Functional Status:  In your present state of health, do you have any difficulty performing the following activities: 04/11/2015 03/30/2015  Hearing? N N  Vision? Y N  Difficulty concentrating or making decisions? Y N  Walking or climbing stairs? Y Y  Dressing or bathing? N N  Doing errands, shopping? Y N  Preparing Food and eating ? N -  Using the Toilet? N -  In the past six months, have you accidently leaked urine? N -  Do you have problems with loss of bowel control? N -  Managing your Medications? N -  Managing your Finances? N -  Housekeeping or managing your Housekeeping? Y -    Fall/Depression Screening:  PHQ 2/9 Scores 04/11/2015 02/23/2015 02/16/2014  PHQ - 2 Score 0 0 1    Assessment:   CSW received a new referral on patient from patient's RNCM with [facility], Tomasa Rand indicating that patient is interested in completing his Advanced Directives ([place] documents.  CSW submitted a request to [name], Care Management Assistant with [facility], to mail an Advanced Directives packet to patient's home on June 29th, for his review.  After thorough review of patient's EMR (Electronic Medical Record) in [employer], CSW is aware that patient has been diagnosed with Congestive Heart Failure and is currently living with his daughter, Zeke Aker and his daughter's boyfriend.  The move came shortly after patient learned that he was no longer able to adequately care for himself independently.   CSW was able to make initial contact with patient today to perform phone assessment, as well as assess and assist with social work needs and services.  CSW first obtained two HIPAA compliant  identifiers from patient, which included patient's name and date of birth.  CSW was then able to get patient to verbally consent to converse with CSW over the phone, regarding initiation of his Advanced Directives.  CSW introduced self, explained role and types of services provided through [employer].  Last, CSW explained the reason for the call, reporting that CSW works with Mrs. Lacinda Axon, and that [facility] was interested in assisting patient with completion of his Living Will and [employer].   Patient indicated that he has already received his Advanced Directives packet in the mail, and that he has also had an  opportunity to review it's contents.  Patient questioned why a new packet of information needed to be completed, as patient reported that he already has "My Five Wishes" in place.  Patient went on to say that these documents include his Living Will, [employer] was able to ensure that these documents are binding and that they had to be witnessed and notarized in order to be made legal documents.  Patient reported that these documents are supposed to serve as both medical and financial, appointing his daughter, Jovanne Riggenbach to become his HealthCare agent, in the event that he is unable to make appropriate decisions for himself.  Patient reported that he would have Ms. Barrette review these documents with him again this evening, in comparison with the Advanced Directives packet just received, to ensure their legitimacy.   CSW agreed to schedule a home visit to review the documents with patient, as well as assist with completion, if necessary; however, patient did not feel that a home visit was warranted.  Patient reported that there is a Insurance account manager within his trailer park that can assist with notarizing documents for him, in the event that he and Ms. Dinino decide to complete the Advanced Directives packet  provided to him by [facility].  CSW explained to patient that there are also a list of [place] affiliated agencies that can assist with notary services, free of charge, in the back of the Advanced Directives packet, should patient need to utilize them.  Patient voiced understanding. CSW inquired as to how CSW could be of further assistance to patient at this time.  Patient denied being able to identify any additional social work specific needs.  CSW provided patient with CSW's contact information, encouraging patient to contact CSW directly if social work needs arise in the near future.  Patient was agreeable to this plan, also reporting that he would notify Mrs. Lacinda Axon of any social work specific needs that he may have during his work with her for diease management services.  CSW explained to patient that CSW will no longer outreach to patient for social work services.  Plan:    CSW will no longer make arrangements to contact patient by phone or in person, as all goals of treatment have been met from social work standpoint and no additional social work needs have been identified at this time. CSW will notify patient's RNCM with [facility], Tomasa Rand of CSW's plans to close patient's case. CSW will submit a case closure request to Lurline Del, Care Management Assistant with [facility], in the form of an [employer], ensuring that Mrs. Laurance Flatten is aware of Marline Backbone, RNCM with [facility], continued involvement with patient's care. CSW will fax a correspondence letter to patient's Primary Care Physician, Dr. [name] to report social work involvement with patient.   Nat Christen, BSW, MSW, [facility] [place], [place], [place] [address] [email or web address] [phone]

## 2015-04-26 ENCOUNTER — Encounter: Payer: Self-pay | Admitting: Internal Medicine

## 2015-04-26 ENCOUNTER — Other Ambulatory Visit: Payer: Self-pay

## 2015-04-26 MED ORDER — CLOBETASOL PROPIONATE 0.05 % EX CREA: 1.0000 "application " | TOPICAL_CREAM | Freq: Every day | CUTANEOUS | Status: DC | PRN

## 2015-04-26 NOTE — Patient Outreach (Signed)
Transition of care call: Patient reports that he is doing well. States that the heat makes him tired. Reports weight is down 9 pounds since last week.  ( last week he has gained)  Denies any current swelling.  Reports that he is planning to review his advanced directives and send in paperwork for medication assistance.    Denies any new problems or concerns that he needs help with today.  Plan:  Will continue in the transition of care program. Next home visit planned for July 26th.  Remind patient to call me if needed.  Tomasa Rand, RN, BSN, CEN [facility] [employer] [phone]

## 2015-04-27 ENCOUNTER — Encounter: Payer: Self-pay | Admitting: Internal Medicine

## 2015-04-29 ENCOUNTER — Ambulatory Visit: Payer: Commercial Managed Care - HMO | Admitting: *Deleted

## 2015-05-02 ENCOUNTER — Encounter (HOSPITAL_COMMUNITY): Payer: Self-pay | Admitting: Nurse Practitioner

## 2015-05-02 ENCOUNTER — Ambulatory Visit (HOSPITAL_COMMUNITY)
Admission: RE | Admit: 2015-05-02 | Discharge: 2015-05-02 | Disposition: A | Discharge status: Home / Self Care | Payer: Commercial Managed Care - HMO | Source: Ambulatory Visit | Attending: Nurse Practitioner | Admitting: Nurse Practitioner

## 2015-05-02 VITALS — BP 148/88 | HR 99 | Ht 70.0 in | Wt 300.2 lb

## 2015-05-02 DIAGNOSIS — I4819 Other persistent atrial fibrillation: Secondary | ICD-10-CM

## 2015-05-02 DIAGNOSIS — I4510 Unspecified right bundle-branch block: Secondary | ICD-10-CM | POA: Diagnosis not present

## 2015-05-02 DIAGNOSIS — I481 Persistent atrial fibrillation: Secondary | ICD-10-CM | POA: Diagnosis not present

## 2015-05-02 DIAGNOSIS — I4891 Unspecified atrial fibrillation: Secondary | ICD-10-CM | POA: Insufficient documentation

## 2015-05-02 NOTE — Patient Instructions (Signed)
Follow up with klein in 1 month

## 2015-05-02 NOTE — Progress Notes (Signed)
Patient ID: [name], male   DOB: 08-18-1956, 59 y.o.   MRN: [id number]     Primary Care Physician: [name], MD Referring Physician: Dr. Derald Macleod is a 59 y.o. male with a h/o history of paroxysmal atrial fibrillation and has been managed with amiodarone and Coumadin therapy. He also has a history of chronic combined systolic and diastolic heart failure in the setting of morbid obesity and sleep apnea. Most recent EF was 40-45% earlier this month. In May, he was found to have a suppressed TSH of 0.01. This was felt to be most likely secondary to amiodarone therapy. In that setting, he developed recurrent afib and was seen in late May by Dr. [name] @ which time his amio was increased to 200 mg daily in an effort to increase his amio level and get him to TEE and cardioversion. He underwent TEE/DCCV on 5/31, which was initially successfuly, but converted back to atrial fibrillation within 1 day. He was subsequently seen in atrial fibrillation clinic on 6/8 and was noted to be in afib with rates into the 180's with activity. His BB was switched from metoprolol to bystolic, dilt was added, and digoxin was d/c'd. It was hoped that he could undergo repeat DCCV however on 6/15, he experienced a syncopal episode while walking from his car. He presented to the [facility] ED. There, he was noted to be volume overloaded. He was admitted for diuresis (diuresed ~ 25 lbs). Echo showed EF of 40-45%, which was down from recent TEE. He was seen by EP and given amio induced thyrotoxicosis, his amio was d/c'd. A myoview was undertaken during hospitalization and revealed prior inferior infarct w/o active ischemia. In setting of syncope, EP recommended ILR, and a Linq event recorder was placed prior to discharge.   He returns to afib clinic today and reports overall he is feeling better. Fluid status is stable. He is seeing Endocrinology 8/8 for further tx for hyperthyroidism, on tapazole 10 mg bid.  Review of last Linq report shows heart rate around 100. He is aware that afib will be hard to rate control until thyroid is further treated. No further syncope but does admit to days of feeling lightheaded. He does have home health that is following him as well.  Today, he denies symptoms of palpitations, chest pain, shortness of breath, orthopnea, PND, lower extremity edema, dizziness, presyncope, syncope, or neurologic sequela. The patient is tolerating medications without difficulties and is otherwise without complaint today.   Past Medical History  Diagnosis Date  . DYSKINESIA, ESOPHAGUS   . FIBROMYALGIA   . HYPERLIPIDEMIA     a. Intolerant to statins.  . INSOMNIA     takes Ambien nightly  . Rash and other nonspecific skin eruption 04/12/2009    no cause found saw dermatologists x 2 and allergist  . Paroxysmal atrial fibrillation     a. CHA2DS2VASc = 3--> takes Coumadin;  b. 03/15/2015 Successful TEE/DCCV;  c. 03/2015 recurrent afib, Amio d/c'd in setting of hyperthyroidism.  . Cardiomyopathy     a. EF 25% TEE July 2013; b. EF normalized 2015;  c. 03/2015 Echo: EF 40-45%, difrf HK, PASP 38 mmHg, Mild MR, sev LAE/RAE.  [name] Myocardial infarction     a. 2012 Myoview notable for prior infarct;  b. 03/2015 Lexiscan CL: EF 37%, diff HK, small area of inferior infarct from apex to base-->Med Rx.  . ANXIETY     takes Xanax nightly  . ASTHMA  Albuterol prn and Advair daily;also takes Prednisone daily  . GERD (gastroesophageal reflux disease)        . Type II diabetes mellitus        . Essential hypertension        . COPD (chronic obstructive pulmonary disease)   . O2 dependent     uses oxygen 2 and 1/2 liters per [place] at hs  . SLEEP APNEA, OBSTRUCTIVE     a. doesn't use CPAP  . Peripheral neuropathy   . Arthritis   . Chronic constipation     takes OTC stool softener  . Diverticulitis   . Glaucoma   . DEPRESSION     takes Zoloft and Doxepin daily  . Anemia     supposed to be taking  Vit B but doesn't  . Syncope     a. 03/2015 s/p MDT LINQ.   Past Surgical History  Procedure Laterality Date  . Tonsillectomy    . Acne cyst removal      2 on back   . Cardioversion  04/18/2012    Procedure: CARDIOVERSION;  Surgeon: Fay Records, MD;  Location: [place];  Service: Cardiovascular;  Laterality: N/A;  . Tee without cardioversion  04/25/2012    Procedure: TRANSESOPHAGEAL ECHOCARDIOGRAM (TEE);  Surgeon: Thayer Headings, MD;  Location: [place];  Service: Cardiovascular;  Laterality: N/A;  . Cardioversion  04/25/2012    Procedure: CARDIOVERSION;  Surgeon: Thayer Headings, MD;  Location: [facility] ENDOSCOPY;  Service: Cardiovascular;  Laterality: N/A;  . Cardioversion  04/25/2012    Procedure: CARDIOVERSION;  Surgeon: Fay Records, MD;  Location: [place];  Service: Cardiovascular;  Laterality: N/A;  . Cardioversion  05/09/2012    Procedure: CARDIOVERSION;  Surgeon: Sherren Mocha, MD;  Location: [place];  Service: Cardiovascular;  Laterality: N/A;  changed from crenshaw to cooper by trish/leone-endo  . Colonoscopy    . Esophagogastroduodenoscopy    . Total knee arthroplasty Right 06/15/2014    Procedure: TOTAL KNEE ARTHROPLASTY;  Surgeon: Renette Butters, MD;  Location: [place];  Service: Orthopedics;  Laterality: Right;  . Colonoscopy with propofol N/A 10/21/2014    Procedure: COLONOSCOPY WITH PROPOFOL;  Surgeon: Ladene Artist, MD;  Location: [facility] ENDOSCOPY;  Service: Endoscopy;  Laterality: N/A;  . Tee without cardioversion N/A 03/15/2015    Procedure: TRANSESOPHAGEAL ECHOCARDIOGRAM (TEE);  Surgeon: Thayer Headings, MD;  Location: [place];  Service: Cardiovascular;  Laterality: N/A;  . Cardioversion N/A 03/15/2015    Procedure: CARDIOVERSION;  Surgeon: Thayer Headings, MD;  Location: [place];  Service: Cardiovascular;  Laterality: N/A;  . Ep implantable device N/A 04/06/2015    Procedure: Loop Recorder Insertion;  Surgeon: Evans Lance, MD;  Location: [place] CV LAB;  Service:  Cardiovascular;  Laterality: N/A;    Current Outpatient Prescriptions  Medication Sig Dispense Refill  . ADVAIR DISKUS 250-50 MCG/DOSE AEPB TAKE 1 PUFF BY MOUTH DAILY AS DIRECTED 60 each 3  . ALPRAZolam (XANAX) 0.5 MG tablet TAKE 1 TABLET BY MOUTH TWICE A DAY AS NEEDED FOR ANXIETY (Patient taking differently: Take 0.5 mg by mouth at bedtime. ) 60 tablet 2  . b complex vitamins tablet Take 1 tablet by mouth daily.    . carisoprodol (SOMA) 350 MG tablet Take 350 mg by mouth at bedtime.     . clobetasol cream (TEMOVATE) 8.03 % Apply 1 application topically daily as needed (itching). 30 g 1  . diltiazem (CARDIZEM CD) 240 MG 24 hr capsule Take  1 capsule (240 mg total) by mouth daily. 30 capsule 1  . diphenhydrAMINE (BENADRYL) 50 MG tablet Take 1 tablet (50 mg total) by mouth every 6 (six) hours as needed for itching. 30 tablet 2  . doxepin (SINEQUAN) 25 MG capsule Take 25 mg by mouth 2 (two) times daily.    . ferrous sulfate 325 (65 FE) MG tablet Take 325 mg by mouth daily.    . hydrocortisone (ANUSOL-HC) 25 MG suppository Place 1 suppository (25 mg total) rectally every 12 (twelve) hours. 12 suppository 1  . hydrocortisone 2.5 % cream Apply topically 2 (two) times daily. (Patient taking differently: Apply 1 application topically 2 (two) times daily. ) 30 g 5  . hydrOXYzine (ATARAX/VISTARIL) 25 MG tablet TAKE 1 TABLET BY MOUTH 3 TIMES A DAY FOR ITCHING (Patient taking differently: TAKE 1 TABLET BY MOUTH 3 TIMES A DAY AS NEEDED FOR ITCHING) 30 tablet 1  . ibuprofen (ADVIL,MOTRIN) 200 MG tablet Take 400 mg by mouth at bedtime as needed for mild pain (pain).     . Linaclotide (LINZESS) 145 MCG CAPS capsule Take 145 mcg by mouth daily as needed (Crampy pain).     . Loratadine 10 MG CAPS Take 10 mg by mouth daily as needed (itching).     . metFORMIN (GLUCOPHAGE) 500 MG tablet Take 1,000 mg by mouth daily with breakfast.     . methimazole (TAPAZOLE) 10 MG tablet Take 1 tablet (10 mg total) by mouth 2 (two)  times daily. 60 tablet 1  . nebivolol (BYSTOLIC) 5 MG tablet Take 1.5 tablets (7.5 mg total) by mouth daily. 45 tablet 6  . omeprazole (PRILOSEC) 20 MG capsule Take 20 mg by mouth daily with supper.     . polyethylene glycol powder (GLYCOLAX/MIRALAX) powder Take 255 g by mouth daily. (Patient taking differently: Take 17 g by mouth 2 (two) times daily as needed (constipation). Mix in 8 oz liquid and drink) 255 g 3  . potassium chloride SA (K-DUR,KLOR-CON) 20 MEQ tablet Take 2 tablets (40 mEq total) by mouth daily. 30 tablet 1  . PROAIR HFA 108 (90 BASE) MCG/ACT inhaler INHALE 2 PUFFS INTO THE LUNGS 4 TIMES A DAY 8.5 Inhaler 5  . sertraline (ZOLOFT) 100 MG tablet Take 2 tablets (200 mg total) by mouth at bedtime. (Patient taking differently: Take 200 mg by mouth daily with supper. ) 180 tablet 3  . tiZANidine (ZANAFLEX) 2 MG tablet Take 1 tablet (2 mg total) by mouth 3 (three) times daily as needed for muscle spasms. (Patient taking differently: Take 2 mg by mouth at bedtime. ) 30 tablet 4  . Tolnaftate POWD Apply 1 application topically daily. For jock itch    . torsemide (DEMADEX) 20 MG tablet Take 2 tablets (40 mg total) by mouth 2 (two) times daily. 60 tablet 1  . traZODone (DESYREL) 100 MG tablet Take 50 mg by mouth at bedtime. For sleep  1  . warfarin (COUMADIN) 5 MG tablet TAKE AS DIRECTED BY ANTICOAGULATION CLINIC 30 tablet 3  . witch hazel-glycerin (TUCKS) pad Apply 1 application topically as needed for itching, irritation or hemorrhoids.     [name] zolpidem (AMBIEN) 10 MG tablet TAKE 1 TABLET BY MOUTH AT BEDTIME AS NEEDED FOR SLEEP (Patient taking differently: TAKE 1 TABLET BY MOUTH AT BEDTIME) 30 tablet 5  . [DISCONTINUED] atorvastatin (LIPITOR) 20 MG tablet Take 1 tablet (20 mg total) by mouth daily. 30 tablet 11  . [DISCONTINUED] gabapentin (NEURONTIN) 300 MG capsule Take 1 capsule (  300 mg total) by mouth 3 (three) times daily. 90 capsule 5   No current facility-administered medications for  this encounter.    Allergies  Allergen Reactions  . Pravastatin Sodium Other (See Comments)    myalgia  . Simvastatin Other (See Comments)    severe leg cramps  . Statins Other (See Comments)    myalgia  . Tape Other (See Comments)    Skin Tears Use Paper Tape Only    History   Social History  . Marital Status: Divorced    Spouse Name: N/A  . Number of Children: 2  . Years of Education: N/A   Occupational History  . retired/disabled. prev worked in Therapist, sports.    Social History Main Topics  . Smoking status: Former Smoker -- 2.00 packs/day for 30 years    Types: Cigarettes    Quit date: 10/16/2007  . Smokeless tobacco: Never Used  . Alcohol Use: No  . Drug Use: No  . Sexual Activity: Not on file   Other Topics Concern  . Not on file   Social History Narrative   Lives alone.    Family History  Problem Relation Age of Onset  . COPD Mother   . Asthma Mother   . Colon polyps Mother   . Allergies Mother   . Asthma Maternal Grandmother   . Colon cancer Neg Hx     ROS- All systems are reviewed and negative except as per the HPI above  Physical Exam: Filed Vitals:   05/02/15 1051  BP: 148/88  Pulse: 99  Height: 5\' 10"  (1.778 m)  Weight: 300 lb 3.2 oz (136.17 kg)    GEN- The patient is well appearing, alert and oriented x 3 today.   Head- normocephalic, atraumatic Eyes-  Sclera clear, conjunctiva pink Ears- hearing intact Oropharynx- clear Neck- supple, no JVP Lymph- no cervical lymphadenopathy Lungs- Clear to ausculation bilaterally, normal work of breathing Heart- Irregular rate and rhythm, no murmurs, rubs or gallops, PMI not laterally displaced GI- soft, NT, ND, + BS Extremities- no clubbing, cyanosis, or edema MS- no significant deformity or atrophy Skin- no rash or lesion Psych- euthymic mood, full affect Neuro- strength and sensation are intact  EKG- Atrial fibrillation at 99 bpm, RBBB Epic records reviewed   Assessment and  Plan: 1. Persistent afib Acceptable v rates until thyroid can be further treated No change in therapy, considered increasing BB but fear hypotension and syncope Possible tikosyn candidate in future after amiodarone washout  2. Chronic combined sys/diatolic CHF Volume status and weight stable  Continue diuretic  3. Syncope Has linq, no further episodes  F/u with Dr. [name] in one month Endocrinology as scheduled 8/8 Continue diuretic and daily weights

## 2015-05-03 ENCOUNTER — Ambulatory Visit (INDEPENDENT_AMBULATORY_CARE_PROVIDER_SITE_OTHER): Payer: Commercial Managed Care - HMO | Admitting: General Practice

## 2015-05-03 ENCOUNTER — Other Ambulatory Visit: Payer: Self-pay

## 2015-05-03 DIAGNOSIS — I4891 Unspecified atrial fibrillation: Secondary | ICD-10-CM

## 2015-05-03 DIAGNOSIS — Z5181 Encounter for therapeutic drug level monitoring: Secondary | ICD-10-CM

## 2015-05-03 LAB — POCT INR: INR: 7.4

## 2015-05-03 NOTE — Progress Notes (Signed)
Pre visit review using our clinic review tool, if applicable. No additional management support is needed unless otherwise documented below in the visit note. 

## 2015-05-03 NOTE — Progress Notes (Signed)
I have reviewed and agree with the plan. 

## 2015-05-03 NOTE — Patient Outreach (Signed)
Transition of care call: Patient reports that he is doing well. Reports that weight is up 2 pounds from yesterday. Denies swelling. Denies any change in breathing. Reports that he ate some salty snacks. He reports that he is trying to avoid snacks but this is difficult for him.   Encouraged other snacks like carrots, cucumbers, celery with ranch dressing.  Patient reports that he will try.   Patient reports being see at [facility] yesterday and reports that he got a good report. Pending follow up with MD about thyroid.   Reports that he goes to get INR checked today.   Plan: Discussed plan for the week. If patient continues to gain weight he will call MD. Patient to avoid salty snacks. Patient verbalized understanding of plan.  Home visit planned for July 26th.  [facility] CM Care Plan Problem One        Patient Outreach Telephone from 05/03/2015 in [place] Problem One  Patient with recent admission due to heart failure   Care Plan for Problem One  Active   [facility] Long Term Goal (31-90 days)  Patient will be able to report no readmissions in the next 31 days.   [facility] Long Term Goal Start Date  04/07/15 [discharge date of 04/06/2015]   Interventions for Problem One Long Term Goal  Discussed importance of avoiding salty food and snacks.offered other healthy suggestions   [facility] CM Short Term Goal #1 (0-30 days)  Patient will follow up with primary MD within 7-10 days from hospital discharge.   [facility] CM Short Term Goal #1 Start Date  04/07/15   [facility] CM Short Term Goal #1 Met Date  04/19/15 [had follow up with cardiologist,  goal met]   Interventions for Short Term Goal #1  Patient has follow up planned. Reinforced importance of taking meds as prescribed.    [facility] CM Short Term Goal #2 (0-30 days)  Patient will report decreased amount of drainage from loop recorder insertion site in the next 5 days.   [facility] CM Short Term Goal #2 Start Date  04/07/15 [patient reports small amounts of drainage]    [facility] CM Short Term Goal #2 Met Date  04/11/15 [facility] met. no draininage in the lst 3 days.]   Interventions for Short Term Goal #2  goal met    [facility] CM Care Plan Problem Two        Patient Outreach Telephone from 05/03/2015 in [place] Problem Two  Knowledge deficit related to CHF as evidence by lack of understanding of importance.   Care Plan for Problem Two  Active   Interventions for Problem Two Long Term Goal   increased weight today. Enocuraged low salt options. discussed action plan if weight continues to increase.   [facility] Long Term Goal (31-90) days  Patient will be able to verbalize better control of heart failure symptoms and will be able to verbalize when to call MD in the next 60 days.   [facility] Long Term Goal Start Date  04/11/15   [facility] CM Short Term Goal #1 (0-30 days)  Patient will be able to report weighing daily for the next 30 days.   [facility] CM Short Term Goal #1 Start Date  04/11/15   Interventions for Short Term Goal #2   Discussed importance of continuing to weigh daily and report weight gain to MD   [facility] CM Short Term Goal #2 (0-30 days)   Patient will  be able to report understanding of the correct meds to take in the next 5 days.   [facility] CM Short Term Goal #2 Start Date  04/11/15   [facility] CM Short Term Goal #2 Met Date  04/19/15 [facility] Folk met]   Interventions for Short Term Goal #2  Reviewed all meds with patient during home visit, Sent a referral to [facility] for med review. Will contact MD office to get clarification of correct meds.     Tomasa Rand, RN, BSN, CEN [facility] [employer] [phone]

## 2015-05-04 ENCOUNTER — Other Ambulatory Visit: Payer: Self-pay | Admitting: Pharmacist

## 2015-05-04 NOTE — Patient Outreach (Signed)
Patient returned my call to let me know that he received my voicemail and that he will disregard the letter.  Nicoletta Ba, PharmD, [facility] [phone]

## 2015-05-04 NOTE — Patient Outreach (Signed)
I received a voicemail from patient stating that he received a letter from me discontinuing services as I had been unable to reach him.  I sent the letter prior to him calling me back and we have since then resolved his pharmacy issues and closed his case for the pharmacy program.  I called him back to clarify but I was unable to reach him. I called the patient to discuss the letter and I had to leave a HIPAA compliant message for patient to return my phone call if he had questions but I let him know that the letter was sent before I had been able to talk to him.  Nicoletta Ba, PharmD, [facility] [phone]

## 2015-05-06 ENCOUNTER — Ambulatory Visit (INDEPENDENT_AMBULATORY_CARE_PROVIDER_SITE_OTHER): Payer: Commercial Managed Care - HMO

## 2015-05-06 DIAGNOSIS — R55 Syncope and collapse: Secondary | ICD-10-CM

## 2015-05-06 NOTE — Patient Outreach (Signed)
[facility]) Care Management  05/06/2015  RICCARDO HOLEMAN May 02, 1956 [id number]   Telephone call to Dr. Gwynn Burly nurse to make aware that fax was being sent regarding [place] patient assistance program application for advair diskus. Dr. Gwynn Burly office is to complete their portion and fax to patient assistance program.  Tanielle Emigh L. Elbert Ewings [facility] Care Management Assistant [phone]

## 2015-05-09 ENCOUNTER — Other Ambulatory Visit: Payer: Self-pay

## 2015-05-09 ENCOUNTER — Other Ambulatory Visit: Payer: Self-pay | Admitting: Internal Medicine

## 2015-05-09 LAB — TRANSTHORACIC ECHOCARDIOGRAM

## 2015-05-10 ENCOUNTER — Other Ambulatory Visit: Payer: Self-pay

## 2015-05-10 ENCOUNTER — Ambulatory Visit: Payer: Commercial Managed Care - HMO

## 2015-05-10 NOTE — Telephone Encounter (Signed)
Done hardcopy to Dahlia  

## 2015-05-10 NOTE — Telephone Encounter (Signed)
Patients last office visit may/2016, please advise--thanks

## 2015-05-10 NOTE — Patient Outreach (Signed)
[facility]) Care Management   05/10/2015  [name] 1956-06-13 [id number]  [name] is an 59 y.o. male Arrived at 2pm for home visit.  Patient ambulating without difficulty. Subjective: Patient reports that he is doing well. States that his weight has been stable over the last several weeks.  Weight range is 296-297 pounds. Patient reports decreased swelling in the last several weeks. Patient reports that his INR level is high  (7.0) and his coumadin is on hold. Patient reports that he is having a hard time dealing with the 90 degree heat outside. Patient admits that he has only been going out of his home early in the mornings due to the heat. Patient reports that he is watching his low salt diet.  Reports that he has decreased his soft drink consumption.   Denies any recent falls.   Patient reports that he has being in contact with [place] and pharmacy assistant for help with medications.  Patient reports that money is tight.   Objective:  Filed Vitals:   05/10/15 1409  BP: 128/64  Pulse: 105  Resp: 18  Weight: 296 lb (134.265 kg)  SpO2: 97%    Awake and alert. Lungs clear. Bruising noted on arms.  Ambulating in home without difficulty.  Only using oxygen at night.  Review of Systems  Cardiovascular: Positive for leg swelling.    Physical Exam  Constitutional: He is oriented to person, place, and time. He appears well-developed and well-nourished.  Cardiovascular: Normal rate and intact distal pulses.   Irregular heart beat noted.  Respiratory: Effort normal and breath sounds normal. No respiratory distress.  GI: Soft. Bowel sounds are normal.  Musculoskeletal: Normal range of motion. He exhibits edema.  No edema in the right leg, trace edema in the left leg.  Neurological: He is alert and oriented to person, place, and time.  Very pleasant and polite gentleman  Skin: Skin is warm and dry.  bruising noted on both arms.[name]    Psychiatric: He has a  normal mood and affect. His behavior is normal. Judgment and thought content normal.    Current Medications:   Current Outpatient Prescriptions  Medication Sig Dispense Refill  . ADVAIR DISKUS 250-50 MCG/DOSE AEPB TAKE 1 PUFF BY MOUTH DAILY AS DIRECTED 60 each 3  . ALPRAZolam (XANAX) 0.5 MG tablet TAKE 1 TABLET BY MOUTH TWICE A DAY AS NEEDED FOR ANXIETY 60 tablet 2  . b complex vitamins tablet Take 1 tablet by mouth daily.    . carisoprodol (SOMA) 350 MG tablet TAKE 1 TABLET BY MOUTH 3 TIMES A DAY AS NEEDED 90 tablet 2  . clobetasol cream (TEMOVATE) 6.16 % Apply 1 application topically daily as needed (itching). 30 g 1  . diltiazem (CARDIZEM CD) 240 MG 24 hr capsule Take 1 capsule (240 mg total) by mouth daily. 30 capsule 1  . doxepin (SINEQUAN) 25 MG capsule Take 25 mg by mouth 2 (two) times daily.    . ferrous sulfate 325 (65 FE) MG tablet Take 325 mg by mouth daily.    . hydrocortisone 2.5 % cream Apply topically 2 (two) times daily. (Patient taking differently: Apply 1 application topically 2 (two) times daily. ) 30 g 5  . hydrOXYzine (ATARAX/VISTARIL) 25 MG tablet TAKE 1 TABLET BY MOUTH 3 TIMES A DAY FOR ITCHING (Patient taking differently: TAKE 1 TABLET BY MOUTH 3 TIMES A DAY AS NEEDED FOR ITCHING) 30 tablet 1  . ibuprofen (ADVIL,MOTRIN) 200 MG tablet Take  400 mg by mouth at bedtime as needed for mild pain (pain).     . Linaclotide (LINZESS) 145 MCG CAPS capsule Take 145 mcg by mouth daily as needed (Crampy pain).     . nebivolol (BYSTOLIC) 5 MG tablet Take 1.5 tablets (7.5 mg total) by mouth daily. 45 tablet 6  . omeprazole (PRILOSEC) 20 MG capsule Take 20 mg by mouth daily with supper.     . polyethylene glycol powder (GLYCOLAX/MIRALAX) powder Take 255 g by mouth daily. (Patient taking differently: Take 17 g by mouth 2 (two) times daily as needed (constipation). Mix in 8 oz liquid and drink) 255 g 3  . potassium chloride SA (K-DUR,KLOR-CON) 20 MEQ tablet Take 2 tablets (40 mEq total) by  mouth daily. 30 tablet 1  . PROAIR HFA 108 (90 BASE) MCG/ACT inhaler INHALE 2 PUFFS INTO THE LUNGS 4 TIMES A DAY 8.5 Inhaler 5  . sertraline (ZOLOFT) 100 MG tablet Take 2 tablets (200 mg total) by mouth at bedtime. (Patient taking differently: Take 200 mg by mouth daily with supper. ) 180 tablet 3  . tiZANidine (ZANAFLEX) 2 MG tablet Take 1 tablet (2 mg total) by mouth 3 (three) times daily as needed for muscle spasms. (Patient taking differently: Take 2 mg by mouth at bedtime. ) 30 tablet 4  . Tolnaftate POWD Apply 1 application topically daily. For jock itch    . torsemide (DEMADEX) 20 MG tablet Take 2 tablets (40 mg total) by mouth 2 (two) times daily. 60 tablet 1  . traZODone (DESYREL) 100 MG tablet Take 50 mg by mouth at bedtime. For sleep  1  . witch hazel-glycerin (TUCKS) pad Apply 1 application topically as needed for itching, irritation or hemorrhoids.     [name] zolpidem (AMBIEN) 10 MG tablet TAKE 1 TABLET BY MOUTH AT BEDTIME AS NEEDED FOR SLEEP (Patient taking differently: TAKE 1 TABLET BY MOUTH AT BEDTIME) 30 tablet 5  . diphenhydrAMINE (BENADRYL) 50 MG tablet Take 1 tablet (50 mg total) by mouth every 6 (six) hours as needed for itching. (Patient not taking: Reported on 05/10/2015) 30 tablet 2  . hydrocortisone (ANUSOL-HC) 25 MG suppository Place 1 suppository (25 mg total) rectally every 12 (twelve) hours. (Patient not taking: Reported on 05/10/2015) 12 suppository 1  . Loratadine 10 MG CAPS Take 10 mg by mouth daily as needed (itching).     . metFORMIN (GLUCOPHAGE) 500 MG tablet Take 1,000 mg by mouth daily with breakfast.     . methimazole (TAPAZOLE) 10 MG tablet Take 1 tablet (10 mg total) by mouth 2 (two) times daily. (Patient not taking: Reported on 05/10/2015) 60 tablet 1  . warfarin (COUMADIN) 5 MG tablet TAKE AS DIRECTED BY ANTICOAGULATION CLINIC (Patient not taking: Reported on 05/10/2015) 30 tablet 3  . [DISCONTINUED] atorvastatin (LIPITOR) 20 MG tablet Take 1 tablet (20 mg total) by  mouth daily. 30 tablet 11  . [DISCONTINUED] gabapentin (NEURONTIN) 300 MG capsule Take 1 capsule (300 mg total) by mouth 3 (three) times daily. 90 capsule 5   No current facility-administered medications for this visit.    Functional Status:   In your present state of health, do you have any difficulty performing the following activities: 04/25/2015 04/11/2015  Hearing? N N  Vision? Y Y  Difficulty concentrating or making decisions? Tempie Donning  Walking or climbing stairs? Y Y  Dressing or bathing? N N  Doing errands, shopping? Tempie Donning  Preparing Food and eating ? N N  Using the Toilet? N  N  In the past six months, have you accidently leaked urine? N N  Do you have problems with loss of bowel control? N N  Managing your Medications? N N  Managing your Finances? N N  Housekeeping or managing your Housekeeping? Tempie Donning    Fall/Depression Screening:    PHQ 2/9 Scores 04/25/2015 04/11/2015 02/23/2015 02/16/2014  PHQ - 2 Score 0 0 0 1  Exception Documentation Patient refusal - - -   Fall Risk  05/10/2015 04/25/2015 04/11/2015 02/23/2015 02/16/2014  Falls in the past year? (No Data) Yes Yes No Yes  Number falls in past yr: - 2 or more 2 or more - 1  Injury with Fall? - No No - No  Risk Factor Category  - High Fall Risk High Fall Risk - -  Risk for fall due to : - History of fall(s) History of fall(s) - -  Follow up - Education provided Education provided;Falls prevention discussed - -   Assessment:   (1) recording daily weights. Patient reports that he stays in the yellow zone.  (2) Concern for medication cost. (3) no exercise program at this time.  Plan:  (1) patient will continue to weigh daily and record. Patient will continue to call MD for weight gain. (2) Patient is considering using mail order pharmacy as part of his [employer] benefit.  Pending medication assistance with Damita Rhodie with [facility] (3) Patient has agreed to start a home exercise program.  Patient has successfully completed the transition  of care program. Patient will continue to be followed for case management related to heart failure.  Collaborative care planning and goal setting in patients home during home visit. Priority goal is to start a basic exercise program.  Will reassess progress toward goals at next home visit on August  29th. Patient has written next appointment in his calendar.  Patient to call me for questions or problems that arise before next home visit.  Again, encouraged patient to notify MD for weight gain. Patient plans to have labs redrawn on 05/11/2015 by the coumadin clinic due to recent high INR.  [facility] CM Care Plan Problem One        Patient Outreach from 05/10/2015 in [place] Problem One  Patient with recent admission due to heart failure   Care Plan for Problem One  Active   [facility] Long Term Goal (31-90 days)  Patient will be able to report no readmissions in the next 31 days.   [facility] Long Term Goal Start Date  04/07/15 [discharge date of 04/06/2015]   [facility] Long Term Goal Met Date  05/10/15 Barrie Folk met. no readmissions]   Interventions for Problem One Long Term Goal  Discussed importance of avoiding salty food and snacks.offered other healthy suggestions   [facility] CM Short Term Goal #1 (0-30 days)  Patient will follow up with primary MD within 7-10 days from hospital discharge.   [facility] CM Short Term Goal #1 Start Date  04/07/15   [facility] CM Short Term Goal #1 Met Date  04/19/15 [had follow up with cardiologist,  goal met]   Interventions for Short Term Goal #1  Patient has follow up planned. Reinforced importance of taking meds as prescribed.    [facility] CM Short Term Goal #2 (0-30 days)  Patient will report decreased amount of drainage from loop recorder insertion site in the next 5 days.   [facility] CM Short Term Goal #2 Start Date  04/07/15 [patient reports small amounts  of drainage]   [facility] CM Short Term Goal #2 Met Date  04/11/15 [facility] met. no draininage in the lst 3 days.]   Interventions for Short Term  Goal #2  goal met    [facility] CM Care Plan Problem Two        Patient Outreach from 05/10/2015 in [place] Problem Two  Knowledge deficit related to CHF as evidence by lack of understanding of importance.   Care Plan for Problem Two  Active   Interventions for Problem Two Long Term Goal   increased weight today. Enocuraged low salt options. discussed action plan if weight continues to increase.   [facility] Long Term Goal (31-90) days  Patient will be able to verbalize better control of heart failure symptoms and will be able to verbalize when to call MD in the next 60 days.   [facility] Long Term Goal Start Date  04/11/15   [facility] CM Short Term Goal #1 (0-30 days)  Patient will be able to report weighing daily for the next 30 days.   [facility] CM Short Term Goal #1 Start Date  04/11/15   [facility] CM Short Term Goal #1 Met Date   05/10/15 [goal met. Reviewed calendar with daily weights range 296-297]   Interventions for Short Term Goal #2   Discussed importance of continuing to weigh daily and report weight gain to MD   [facility] CM Short Term Goal #2 (0-30 days)   Patient will be able to report understanding of the correct meds to take in the next 5 days.   [facility] CM Short Term Goal #2 Start Date  04/11/15   [facility] CM Short Term Goal #2 Met Date  04/19/15 Barrie Folk met]   Interventions for Short Term Goal #2  Reviewed all meds with patient during home visit, Sent a referral to [place] for med review. Will contact MD office to get clarification of correct meds.   [facility] CM Short Term Goal #3 (0-30 days)  Patient will verbalize starting an exercise program in the next 30 days. Patients goal is to walk 100 feet continuously in home 5 times per week for 30 days.   [facility] CM Short Term Goal #3 Start Date  05/10/15   Interventions for Short Term Goal #3  Measured out 25 feet in home. Education officer, museum to end of hallway). Patient will walk 4 laps. Encouraged paitent to record on his calendar  the days that he is able to  exercise. Provded reassurance and guidiance to help patient prepare for exercise. Reviewed reasons to rest like dizziness, or weakness.  Discussed beneifts of increased energy by starting to exercise.     [place] Problem Three        Patient Outreach from 05/10/2015 in [place] Problem Three  Patient will concerns about medication cost out of pocket.   Care Plan for Problem Three  Active   [facility] CM Short Term Goal #1 (0-30 days)  Patient will call [place] to discuss possible cost savings by switching from [facility] to mail order in the next 30 days.   [facility] CM Short Term Goal #1 Start Date  05/10/15   Interventions for Short Term Goal #1  Encouraged patient to call and do a price comparsion with [facility] and [employer] mail order medications, Encouraged patient to notify MD if he enrolls in mail order pharmacy.     Tomasa Rand, RN, BSN, CEN [employer]  Care Coordinator [phone]

## 2015-05-11 ENCOUNTER — Other Ambulatory Visit: Payer: Self-pay

## 2015-05-11 MED ORDER — FLUTICASONE-SALMETEROL 250-50 MCG/DOSE IN AEPB: INHALATION_SPRAY | RESPIRATORY_TRACT | Status: DC

## 2015-05-13 DIAGNOSIS — G4733 Obstructive sleep apnea (adult) (pediatric): Secondary | ICD-10-CM | POA: Diagnosis not present

## 2015-05-17 ENCOUNTER — Other Ambulatory Visit: Payer: Self-pay | Admitting: Physical Medicine & Rehabilitation

## 2015-05-17 ENCOUNTER — Ambulatory Visit (INDEPENDENT_AMBULATORY_CARE_PROVIDER_SITE_OTHER): Payer: Commercial Managed Care - HMO | Admitting: General Practice

## 2015-05-17 DIAGNOSIS — Z5181 Encounter for therapeutic drug level monitoring: Secondary | ICD-10-CM

## 2015-05-17 DIAGNOSIS — I4891 Unspecified atrial fibrillation: Secondary | ICD-10-CM

## 2015-05-17 LAB — POCT INR: INR: 1.2

## 2015-05-17 NOTE — Progress Notes (Signed)
I have reviewed and agree with the plan. 

## 2015-05-17 NOTE — Progress Notes (Signed)
Pre visit review using our clinic review tool, if applicable. No additional management support is needed unless otherwise documented below in the visit note. 

## 2015-05-18 NOTE — Progress Notes (Signed)
Loop recorder 

## 2015-05-21 ENCOUNTER — Other Ambulatory Visit: Payer: Self-pay | Admitting: Physical Medicine & Rehabilitation

## 2015-05-22 ENCOUNTER — Other Ambulatory Visit: Payer: Self-pay | Admitting: Internal Medicine

## 2015-05-23 ENCOUNTER — Encounter: Payer: Self-pay | Admitting: Endocrinology

## 2015-05-23 ENCOUNTER — Ambulatory Visit (INDEPENDENT_AMBULATORY_CARE_PROVIDER_SITE_OTHER): Payer: Commercial Managed Care - HMO | Admitting: Endocrinology

## 2015-05-23 ENCOUNTER — Other Ambulatory Visit: Payer: Self-pay | Admitting: Internal Medicine

## 2015-05-23 VITALS — BP 124/68 | HR 90 | Temp 97.8°F | Resp 16 | Ht 70.0 in | Wt 302.6 lb

## 2015-05-23 DIAGNOSIS — E0520 Thyrotoxicosis with toxic multinodular goiter without thyrotoxic crisis or storm: Secondary | ICD-10-CM

## 2015-05-23 DIAGNOSIS — E0590 Thyrotoxicosis, unspecified without thyrotoxic crisis or storm: Secondary | ICD-10-CM

## 2015-05-23 LAB — T4, FREE: Free T4: 2.72 ng/dL — ABNORMAL HIGH (ref 0.60–1.60)

## 2015-05-23 LAB — T3, FREE: T3, Free: 4.5 pg/mL — ABNORMAL HIGH (ref 2.3–4.2)

## 2015-05-23 NOTE — Progress Notes (Signed)
Quick Note:  Please let patient know that the thyroid test is improved but still high, increase methimazole from 10 mg twice a day to 20 mg twice a day, needs follow-up in 3 weeks with labs  ______

## 2015-05-23 NOTE — Progress Notes (Signed)
Patient ID: [name], male   DOB: 28-Mar-1956, 59 y.o.   MRN: [id number]                                                                                                               Reason for Appointment:  Hyperthyroidism, new consultation  Referring physician: [name]   History of Present Illness:   For the last 2-3 months patient has had symptoms of palpitations, shakiness, feeling excessively warm and sweaty, and fatigue.  His appetite has been normal.  His weight has been difficult to assess has because of his CHF it has fluctuated based on his fluid status Because of his taking amiodarone for 3 years he has had sporadic monitoring of his thyroid levels About a year ago his TSH was normal but in 2016 it was found to be low in 5/16 Also during his hospitalization in 6/16 he was found to be hyperthyroid with significantly high free T4 levels   Since then he has been on methimazole 10 mg twice a day, however he does not think he is feeling much better and still complains of above symptoms  Lab Results  Component Value Date   FREET4 2.72* 05/23/2015   FREET4 4.79* 04/01/2015   FREET4 1.44 02/23/2015   TSH 0.011* 04/01/2015   TSH 0.04* 02/23/2015   TSH 0.01* 02/21/2015         Medication List       This list is accurate as of: 05/23/15  9:35 PM.  Always use your most recent med list.               ALPRAZolam 0.5 MG tablet  Commonly known as:  XANAX  TAKE 1 TABLET BY MOUTH TWICE A DAY AS NEEDED FOR ANXIETY     b complex vitamins tablet  Take 1 tablet by mouth daily.     carisoprodol 350 MG tablet  Commonly known as:  SOMA  TAKE 1 TABLET BY MOUTH 3 TIMES A DAY AS NEEDED     clobetasol cream 0.05 %  Commonly known as:  TEMOVATE  Apply 1 application topically daily as needed (itching).     diltiazem 240 MG 24 hr capsule  Commonly known as:  CARDIZEM CD  Take 1 capsule (240 mg total) by mouth daily.     diphenhydrAMINE 50 MG tablet  Commonly known as:   BENADRYL  Take 1 tablet (50 mg total) by mouth every 6 (six) hours as needed for itching.     doxepin 25 MG capsule  Commonly known as:  SINEQUAN  Take 25 mg by mouth 2 (two) times daily.     ferrous sulfate 325 (65 FE) MG tablet  Take 325 mg by mouth daily.     Fluticasone-Salmeterol 250-50 MCG/DOSE Aepb  Commonly known as:  ADVAIR DISKUS  TAKE 1 PUFF BY MOUTH DAILY AS DIRECTED     hydrocortisone 2.5 % cream  Apply topically 2 (two) times daily.     hydrocortisone 25 MG suppository  Commonly known as:  ANUSOL-HC  Place 1 suppository (25 mg total) rectally every 12 (twelve) hours.     hydrOXYzine 25 MG tablet  Commonly known as:  ATARAX/VISTARIL  TAKE 1 TABLET BY MOUTH 3 TIMES A DAY FOR ITCHING     ibuprofen 200 MG tablet  Commonly known as:  ADVIL,MOTRIN  Take 400 mg by mouth at bedtime as needed for mild pain (pain).     Linaclotide 145 MCG Caps capsule  Commonly known as:  LINZESS  Take 145 mcg by mouth daily as needed (Crampy pain).     Loratadine 10 MG Caps  Take 10 mg by mouth daily as needed (itching).     metFORMIN 500 MG tablet  Commonly known as:  GLUCOPHAGE  Take 1,000 mg by mouth daily with breakfast.     methimazole 10 MG tablet  Commonly known as:  TAPAZOLE  Take 1 tablet (10 mg total) by mouth 2 (two) times daily.     nebivolol 5 MG tablet  Commonly known as:  BYSTOLIC  Take 1.5 tablets (7.5 mg total) by mouth daily.     omeprazole 20 MG capsule  Commonly known as:  PRILOSEC  Take 20 mg by mouth daily with supper.     polyethylene glycol powder powder  Commonly known as:  GLYCOLAX/MIRALAX  Take 255 g by mouth daily.     potassium chloride SA 20 MEQ tablet  Commonly known as:  K-DUR,KLOR-CON  Take 2 tablets (40 mEq total) by mouth daily.     albuterol (5 MG/ML) 0.5% Nebu  Commonly known as:  PROVENTIL, VENTOLIN  Take by nebulization continuous.     PROAIR HFA 108 (90 BASE) MCG/ACT inhaler  Generic drug:  albuterol  INHALE 2 PUFFS INTO  THE LUNGS 4 TIMES A DAY     sertraline 100 MG tablet  Commonly known as:  ZOLOFT  Take 2 tablets (200 mg total) by mouth at bedtime.     tiZANidine 2 MG tablet  Commonly known as:  ZANAFLEX  Take 1 tablet (2 mg total) by mouth 3 (three) times daily as needed for muscle spasms.     Tolnaftate Powd  Apply 1 application topically daily. For jock itch     torsemide 20 MG tablet  Commonly known as:  DEMADEX  TAKE 2 TABLETS BY MOUTH TWICE A DAY     traZODone 100 MG tablet  Commonly known as:  DESYREL  Take 50 mg by mouth at bedtime. For sleep     traZODone 100 MG tablet  Commonly known as:  DESYREL  TAKE 1/2 TABLET BY MOUTH AT BEDTIME AS NEEDED     warfarin 5 MG tablet  Commonly known as:  COUMADIN  TAKE AS DIRECTED BY ANTICOAGULATION CLINIC     witch hazel-glycerin pad  Commonly known as:  TUCKS  Apply 1 application topically as needed for itching, irritation or hemorrhoids.     zolpidem 10 MG tablet  Commonly known as:  AMBIEN  TAKE 1 TABLET BY MOUTH AT BEDTIME AS NEEDED FOR SLEEP            Past Medical History  Diagnosis Date  . DYSKINESIA, ESOPHAGUS   . FIBROMYALGIA   . HYPERLIPIDEMIA     a. Intolerant to statins.  . INSOMNIA     takes Ambien nightly  . Rash and other nonspecific skin eruption 04/12/2009    no cause found saw dermatologists x 2 and allergist  . Paroxysmal atrial fibrillation     a. CHA2DS2VASc = 3-->  takes Coumadin;  b. 03/15/2015 Successful TEE/DCCV;  c. 03/2015 recurrent afib, Amio d/c'd in setting of hyperthyroidism.  . Cardiomyopathy     a. EF 25% TEE July 2013; b. EF normalized 2015;  c. 03/2015 Echo: EF 40-45%, difrf HK, PASP 38 mmHg, Mild MR, sev LAE/RAE.  [name] Myocardial infarction     a. 2012 Myoview notable for prior infarct;  b. 03/2015 Lexiscan CL: EF 37%, diff HK, small area of inferior infarct from apex to base-->Med Rx.  . ANXIETY     takes Xanax nightly  . ASTHMA     Albuterol prn and Advair daily;also takes Prednisone daily  . GERD  (gastroesophageal reflux disease)        . Type II diabetes mellitus        . Essential hypertension        . COPD (chronic obstructive pulmonary disease)   . O2 dependent     uses oxygen 2 and 1/2 liters per [place] at hs  . SLEEP APNEA, OBSTRUCTIVE     a. doesn't use CPAP  . Peripheral neuropathy   . Arthritis   . Chronic constipation     takes OTC stool softener  . Diverticulitis   . Glaucoma   . DEPRESSION     takes Zoloft and Doxepin daily  . Anemia     supposed to be taking Vit B but doesn't  . Syncope     a. 03/2015 s/p MDT LINQ.    Past Surgical History  Procedure Laterality Date  . Tonsillectomy    . Acne cyst removal      2 on back   . Cardioversion  04/18/2012    Procedure: CARDIOVERSION;  Surgeon: Fay Records, MD;  Location: [place];  Service: Cardiovascular;  Laterality: N/A;  . Tee without cardioversion  04/25/2012    Procedure: TRANSESOPHAGEAL ECHOCARDIOGRAM (TEE);  Surgeon: Thayer Headings, MD;  Location: [place];  Service: Cardiovascular;  Laterality: N/A;  . Cardioversion  04/25/2012    Procedure: CARDIOVERSION;  Surgeon: Thayer Headings, MD;  Location: [facility] ENDOSCOPY;  Service: Cardiovascular;  Laterality: N/A;  . Cardioversion  04/25/2012    Procedure: CARDIOVERSION;  Surgeon: Fay Records, MD;  Location: [place];  Service: Cardiovascular;  Laterality: N/A;  . Cardioversion  05/09/2012    Procedure: CARDIOVERSION;  Surgeon: Sherren Mocha, MD;  Location: [place];  Service: Cardiovascular;  Laterality: N/A;  changed from crenshaw to cooper by trish/leone-endo  . Colonoscopy    . Esophagogastroduodenoscopy    . Total knee arthroplasty Right 06/15/2014    Procedure: TOTAL KNEE ARTHROPLASTY;  Surgeon: Renette Butters, MD;  Location: [place];  Service: Orthopedics;  Laterality: Right;  . Colonoscopy with propofol N/A 10/21/2014    Procedure: COLONOSCOPY WITH PROPOFOL;  Surgeon: Ladene Artist, MD;  Location: [facility] ENDOSCOPY;  Service: Endoscopy;  Laterality: N/A;  . Tee  without cardioversion N/A 03/15/2015    Procedure: TRANSESOPHAGEAL ECHOCARDIOGRAM (TEE);  Surgeon: Thayer Headings, MD;  Location: [place];  Service: Cardiovascular;  Laterality: N/A;  . Cardioversion N/A 03/15/2015    Procedure: CARDIOVERSION;  Surgeon: Thayer Headings, MD;  Location: [place];  Service: Cardiovascular;  Laterality: N/A;  . Ep implantable device N/A 04/06/2015    Procedure: Loop Recorder Insertion;  Surgeon: Evans Lance, MD;  Location: [place] CV LAB;  Service: Cardiovascular;  Laterality: N/A;    Family History  Problem Relation Age of Onset  . COPD Mother   . Asthma Mother   .  Colon polyps Mother   . Allergies Mother   . Hypothyroidism Mother   . Asthma Maternal Grandmother   . Colon cancer Neg Hx     Social History:  reports that he quit smoking about 7 years ago. His smoking use included Cigarettes. He has a 60 pack-year smoking history. He has never used smokeless tobacco. He reports that he does not drink alcohol or use illicit drugs.  Allergies:  Allergies  Allergen Reactions  . Pravastatin Sodium Other (See Comments)    myalgia  . Simvastatin Other (See Comments)    severe leg cramps  . Statins Other (See Comments)    myalgia  . Tape Other (See Comments)    Skin Tears Use Paper Tape Only    Review of Systems:  Review of Systems  Constitutional: Positive for malaise/fatigue.  Respiratory: Positive for shortness of breath.   Cardiovascular: Positive for palpitations.       Atrial fib since 2001    His recent weight levels have been as follows:  Wt Readings from Last 3 Encounters:  05/23/15 302 lb 9.6 oz (137.258 kg)  05/10/15 296 lb (134.265 kg)  05/03/15 299 lb 6.4 oz (135.807 kg)   He still continues to have swelling in his legs especially left side.  Has been treated with diuretics by cardiologist He is asking about increasing dry mouth since his diuretics were changed  No history of renal problems or electrolyte  abnormalities  Lab Results  Component Value Date   CREATININE 0.96 04/14/2015   BUN 30* 04/14/2015   NA 138 04/14/2015   K 4.4 04/14/2015   CL 102 04/14/2015   CO2 26 04/14/2015    No history of numbness or tingling in his feet.  He had been diagnosed to have mild diabetes but blood sugars have been relatively better and he has not taken metformin that he had previously been given  Lab Results  Component Value Date   HGBA1C 5.8 03/07/2015     Examination:   BP 124/68 mmHg  Pulse 90  Temp(Src) 97.8 F (36.6 C)  Resp 16  Ht 5\' 10"  (1.778 m)  Wt 302 lb 9.6 oz (137.258 kg)  BMI 43.42 kg/m2  SpO2 97%   General Appearance:  well-built andhas generalized obesity, pleasant, not anxious or hyperkinetic.        Eyes: No unusual prominence, lid lag or stare. No swelling of the eyelids  Neck: The thyroid is  palpable on swallowing mostly on the right side and is about 2 times normal.  Minimal enlargement of left side also palpable on swallowing medially No local tenderness.  No stridor There is no lymphadenopathyin the neck  .          Heart:  his heart rate is about 108 irregular.  Has normal S1 and S2, no murmurs .          Lungs: breath sounds are clear bilaterally Abdomen: no hepatosplenomegaly or other palpable abnormality  Extremities: hands are normal touch. No ankle edema. Neurological: Deep tendon reflexes at biceps are appearing normal, ankle reflexes difficult to elicit. Bilateral fine tremors are present. Skin: No rash, abnormal thickening of the skin on legs or pigmentation seen     Assessment/Plan:   Hyperthyroidism, Likely to be from long-term use of amiodarone in the setting of probable multinodular goiter  He has been on methimazole for about 6 weeks with only mild subjective improvement in symptoms. Objectively he still has tachycardia, some tremor  Given his  baseline significantly high free T4 levels and use of amiodarone he would probably need relative large  doses of methimazole, currently only on 10 mg twice a day.  We will check his labs today and decide on further dosage adjustment Also potentially if he has resistant hyperthyroidism may consider adding potassium perchlorate   Tatisha Cerino 05/23/2015, 9:35 PM   Addendum: Free T4 still significantly high, T3 is also high.  Increase methimazole to 20 mg twice a day and follow-up in 3 weeks

## 2015-05-24 ENCOUNTER — Other Ambulatory Visit: Payer: Self-pay | Admitting: *Deleted

## 2015-05-24 ENCOUNTER — Ambulatory Visit (INDEPENDENT_AMBULATORY_CARE_PROVIDER_SITE_OTHER): Payer: Commercial Managed Care - HMO | Admitting: General Practice

## 2015-05-24 DIAGNOSIS — Z5181 Encounter for therapeutic drug level monitoring: Secondary | ICD-10-CM

## 2015-05-24 DIAGNOSIS — I4891 Unspecified atrial fibrillation: Secondary | ICD-10-CM | POA: Diagnosis not present

## 2015-05-24 LAB — POCT INR: INR: 2.6

## 2015-05-24 MED ORDER — METHIMAZOLE 10 MG PO TABS: 20.0000 mg | ORAL_TABLET | Freq: Two times a day (BID) | ORAL | Status: DC

## 2015-05-24 NOTE — Progress Notes (Signed)
I have reviewed and agree with the plan. 

## 2015-05-24 NOTE — Progress Notes (Signed)
Pre visit review using our clinic review tool, if applicable. No additional management support is needed unless otherwise documented below in the visit note. 

## 2015-05-25 ENCOUNTER — Other Ambulatory Visit: Payer: Self-pay

## 2015-05-25 MED ORDER — ZOLPIDEM TARTRATE 10 MG PO TABS: 10.0000 mg | ORAL_TABLET | Freq: Every day | ORAL | Status: DC

## 2015-05-25 NOTE — Telephone Encounter (Signed)
Done hardcopy to Dahlia  

## 2015-05-26 ENCOUNTER — Encounter: Payer: Self-pay | Admitting: Internal Medicine

## 2015-05-26 ENCOUNTER — Ambulatory Visit (INDEPENDENT_AMBULATORY_CARE_PROVIDER_SITE_OTHER): Payer: Commercial Managed Care - HMO | Admitting: Internal Medicine

## 2015-05-26 VITALS — BP 122/80 | HR 105 | Temp 98.6°F | Ht 70.0 in | Wt 306.0 lb

## 2015-05-26 DIAGNOSIS — E119 Type 2 diabetes mellitus without complications: Secondary | ICD-10-CM | POA: Diagnosis not present

## 2015-05-26 DIAGNOSIS — Z0189 Encounter for other specified special examinations: Secondary | ICD-10-CM

## 2015-05-26 DIAGNOSIS — I10 Essential (primary) hypertension: Secondary | ICD-10-CM

## 2015-05-26 DIAGNOSIS — Z0000 Encounter for general adult medical examination without abnormal findings: Secondary | ICD-10-CM

## 2015-05-26 DIAGNOSIS — R21 Rash and other nonspecific skin eruption: Secondary | ICD-10-CM | POA: Diagnosis not present

## 2015-05-26 DIAGNOSIS — E222 Syndrome of inappropriate secretion of antidiuretic hormone: Secondary | ICD-10-CM | POA: Diagnosis not present

## 2015-05-26 MED ORDER — DOXYCYCLINE HYCLATE 100 MG PO TABS: 100.0000 mg | ORAL_TABLET | Freq: Two times a day (BID) | ORAL | Status: DC

## 2015-05-26 NOTE — Progress Notes (Signed)
Pre visit review using our clinic review tool, if applicable. No additional management support is needed unless otherwise documented below in the visit note. 

## 2015-05-26 NOTE — Progress Notes (Signed)
Subjective:    Patient ID: [name], male    DOB: 1956/04/28, 59 y.o.   MRN: [id number]  HPI  Here to f/u, developed numerous small tender open superficial sores to legs mostly, some to abd as well, x 2-3 days, without fever, drainage, red streaks or swelling.   Pt denies fever, wt loss, night sweats, loss of appetite, or other constitutional symptoms   Has seen endo recently with increased methimazole after testing.  Has chronic afib - Pt denies chest pain, increased sob or doe, wheezing, orthopnea, PND, increased LE swelling, palpitations, dizziness or syncope.  Pt denies new neurological symptoms such as new headache, or facial or extremity weakness or numbness Past Medical History  Diagnosis Date  . DYSKINESIA, ESOPHAGUS   . FIBROMYALGIA   . HYPERLIPIDEMIA     a. Intolerant to statins.  . INSOMNIA     takes Ambien nightly  . Rash and other nonspecific skin eruption 04/12/2009    no cause found saw dermatologists x 2 and allergist  . Paroxysmal atrial fibrillation     a. CHA2DS2VASc = 3--> takes Coumadin;  b. 03/15/2015 Successful TEE/DCCV;  c. 03/2015 recurrent afib, Amio d/c'd in setting of hyperthyroidism.  . Cardiomyopathy     a. EF 25% TEE July 2013; b. EF normalized 2015;  c. 03/2015 Echo: EF 40-45%, difrf HK, PASP 38 mmHg, Mild MR, sev LAE/RAE.  [name] Myocardial infarction     a. 2012 Myoview notable for prior infarct;  b. 03/2015 Lexiscan CL: EF 37%, diff HK, small area of inferior infarct from apex to base-->Med Rx.  . ANXIETY     takes Xanax nightly  . ASTHMA     Albuterol prn and Advair daily;also takes Prednisone daily  . GERD (gastroesophageal reflux disease)        . Type II diabetes mellitus        . Essential hypertension        . COPD (chronic obstructive pulmonary disease)   . O2 dependent     uses oxygen 2 and 1/2 liters per [place] at hs  . SLEEP APNEA, OBSTRUCTIVE     a. doesn't use CPAP  . Peripheral neuropathy   . Arthritis   . Chronic constipation     takes  OTC stool softener  . Diverticulitis   . Glaucoma   . DEPRESSION     takes Zoloft and Doxepin daily  . Anemia     supposed to be taking Vit B but doesn't  . Syncope     a. 03/2015 s/p MDT LINQ.   Past Surgical History  Procedure Laterality Date  . Tonsillectomy    . Acne cyst removal      2 on back   . Cardioversion  04/18/2012    Procedure: CARDIOVERSION;  Surgeon: Fay Records, MD;  Location: [place];  Service: Cardiovascular;  Laterality: N/A;  . Tee without cardioversion  04/25/2012    Procedure: TRANSESOPHAGEAL ECHOCARDIOGRAM (TEE);  Surgeon: Thayer Headings, MD;  Location: [place];  Service: Cardiovascular;  Laterality: N/A;  . Cardioversion  04/25/2012    Procedure: CARDIOVERSION;  Surgeon: Thayer Headings, MD;  Location: [facility] ENDOSCOPY;  Service: Cardiovascular;  Laterality: N/A;  . Cardioversion  04/25/2012    Procedure: CARDIOVERSION;  Surgeon: Fay Records, MD;  Location: [place];  Service: Cardiovascular;  Laterality: N/A;  . Cardioversion  05/09/2012    Procedure: CARDIOVERSION;  Surgeon: Sherren Mocha, MD;  Location: [place];  Service: Cardiovascular;  Laterality: N/A;  changed from crenshaw to cooper by trish/leone-endo  . Colonoscopy    . Esophagogastroduodenoscopy    . Total knee arthroplasty Right 06/15/2014    Procedure: TOTAL KNEE ARTHROPLASTY;  Surgeon: Renette Butters, MD;  Location: [place];  Service: Orthopedics;  Laterality: Right;  . Colonoscopy with propofol N/A 10/21/2014    Procedure: COLONOSCOPY WITH PROPOFOL;  Surgeon: Ladene Artist, MD;  Location: [facility] ENDOSCOPY;  Service: Endoscopy;  Laterality: N/A;  . Tee without cardioversion N/A 03/15/2015    Procedure: TRANSESOPHAGEAL ECHOCARDIOGRAM (TEE);  Surgeon: Thayer Headings, MD;  Location: [place];  Service: Cardiovascular;  Laterality: N/A;  . Cardioversion N/A 03/15/2015    Procedure: CARDIOVERSION;  Surgeon: Thayer Headings, MD;  Location: [place];  Service: Cardiovascular;  Laterality: N/A;  . Ep  implantable device N/A 04/06/2015    Procedure: Loop Recorder Insertion;  Surgeon: Evans Lance, MD;  Location: [place] [facility];  Service: Cardiovascular;  Laterality: N/A;    reports that he quit smoking about 7 years ago. His smoking use included Cigarettes. He has a 60 pack-year smoking history. He has never used smokeless tobacco. He reports that he does not drink alcohol or use illicit drugs. family history includes Allergies in his mother; Asthma in his maternal grandmother and mother; COPD in his mother; Colon polyps in his mother; Hypothyroidism in his mother. There is no history of Colon cancer. Allergies  Allergen Reactions  . Pravastatin Sodium Other (See Comments)    myalgia  . Simvastatin Other (See Comments)    severe leg cramps  . Statins Other (See Comments)    myalgia  . Tape Other (See Comments)    Skin Tears Use Paper Tape Only   Current Outpatient Prescriptions on File Prior to Visit  Medication Sig Dispense Refill  . ALPRAZolam (XANAX) 0.5 MG tablet TAKE 1 TABLET BY MOUTH TWICE A DAY AS NEEDED FOR ANXIETY 60 tablet 2  . b complex vitamins tablet Take 1 tablet by mouth daily.    . carisoprodol (SOMA) 350 MG tablet TAKE 1 TABLET BY MOUTH 3 TIMES A DAY AS NEEDED 90 tablet 2  . clobetasol cream (TEMOVATE) 6.21 % Apply 1 application topically daily as needed (itching). 30 g 1  . diltiazem (CARDIZEM CD) 240 MG 24 hr capsule Take 1 capsule (240 mg total) by mouth daily. 30 capsule 1  . doxepin (SINEQUAN) 25 MG capsule Take 25 mg by mouth 2 (two) times daily.    . ferrous sulfate 325 (65 FE) MG tablet Take 325 mg by mouth daily.    . Fluticasone-Salmeterol (ADVAIR DISKUS) 250-50 MCG/DOSE AEPB TAKE 1 PUFF BY MOUTH DAILY AS DIRECTED 60 each 3  . hydrocortisone (ANUSOL-HC) 25 MG suppository Place 1 suppository (25 mg total) rectally every 12 (twelve) hours. 12 suppository 1  . hydrocortisone 2.5 % cream Apply topically 2 (two) times daily. (Patient taking differently: Apply 1  application topically 2 (two) times daily. ) 30 g 5  . hydrOXYzine (ATARAX/VISTARIL) 25 MG tablet TAKE 1 TABLET BY MOUTH 3 TIMES A DAY FOR ITCHING (Patient taking differently: TAKE 1 TABLET BY MOUTH 3 TIMES A DAY AS NEEDED FOR ITCHING) 30 tablet 1  . ibuprofen (ADVIL,MOTRIN) 200 MG tablet Take 400 mg by mouth at bedtime as needed for mild pain (pain).     . Linaclotide (LINZESS) 145 MCG CAPS capsule Take 145 mcg by mouth daily as needed (Crampy pain).     . Loratadine 10 MG CAPS Take  10 mg by mouth daily as needed (itching).     . methimazole (TAPAZOLE) 10 MG tablet Take 2 tablets (20 mg total) by mouth 2 (two) times daily. 120 tablet 1  . nebivolol (BYSTOLIC) 5 MG tablet Take 1.5 tablets (7.5 mg total) by mouth daily. 45 tablet 6  . omeprazole (PRILOSEC) 20 MG capsule Take 20 mg by mouth daily with supper.     . polyethylene glycol powder (GLYCOLAX/MIRALAX) powder Take 255 g by mouth daily. (Patient taking differently: Take 17 g by mouth 2 (two) times daily as needed (constipation). Mix in 8 oz liquid and drink) 255 g 3  . potassium chloride SA (K-DUR,KLOR-CON) 20 MEQ tablet Take 2 tablets (40 mEq total) by mouth daily. 30 tablet 1  . PROAIR HFA 108 (90 BASE) MCG/ACT inhaler INHALE 2 PUFFS INTO THE LUNGS 4 TIMES A DAY 8.5 Inhaler 5  . sertraline (ZOLOFT) 100 MG tablet Take 2 tablets (200 mg total) by mouth at bedtime. (Patient taking differently: Take 200 mg by mouth daily with supper. ) 180 tablet 3  . tiZANidine (ZANAFLEX) 2 MG tablet Take 1 tablet (2 mg total) by mouth 3 (three) times daily as needed for muscle spasms. (Patient taking differently: Take 2 mg by mouth at bedtime. ) 30 tablet 4  . Tolnaftate POWD Apply 1 application topically daily. For jock itch    . torsemide (DEMADEX) 20 MG tablet TAKE 2 TABLETS BY MOUTH TWICE A DAY 60 tablet 5  . traZODone (DESYREL) 100 MG tablet Take 50 mg by mouth at bedtime. For sleep  1  . traZODone (DESYREL) 100 MG tablet TAKE 1/2 TABLET BY MOUTH AT BEDTIME  AS NEEDED 45 tablet 1  . warfarin (COUMADIN) 5 MG tablet TAKE AS DIRECTED BY ANTICOAGULATION CLINIC 30 tablet 3  . witch hazel-glycerin (TUCKS) pad Apply 1 application topically as needed for itching, irritation or hemorrhoids.     [name] zolpidem (AMBIEN) 10 MG tablet Take 1 tablet (10 mg total) by mouth at bedtime. 30 tablet 5  . albuterol (PROVENTIL, VENTOLIN) (5 MG/ML) 0.5% NEBU Take by nebulization continuous.    . diphenhydrAMINE (BENADRYL) 50 MG tablet Take 1 tablet (50 mg total) by mouth every 6 (six) hours as needed for itching. (Patient not taking: Reported on 05/26/2015) 30 tablet 2  . metFORMIN (GLUCOPHAGE) 500 MG tablet Take 1,000 mg by mouth daily with breakfast.     . [DISCONTINUED] atorvastatin (LIPITOR) 20 MG tablet Take 1 tablet (20 mg total) by mouth daily. 30 tablet 11  . [DISCONTINUED] gabapentin (NEURONTIN) 300 MG capsule Take 1 capsule (300 mg total) by mouth 3 (three) times daily. 90 capsule 5   No current facility-administered medications on file prior to visit.     Review of Systems  Constitutional: Negative for unusual diaphoresis or night sweats HENT: Negative for ringing in ear or discharge Eyes: Negative for double vision or worsening visual disturbance.  Respiratory: Negative for choking and stridor.   Gastrointestinal: Negative for vomiting or other signifcant bowel change Genitourinary: Negative for hematuria or change in urine volume.  Musculoskeletal: Negative for other MSK pain or swelling Skin: Negative for color change and worsening wound.  Neurological: Negative for tremors and numbness other than noted  Psychiatric/Behavioral: Negative for decreased concentration or agitation other than above       Objective:   Physical Exam BP 122/80 mmHg  Pulse 105  Temp(Src) 98.6 F (37 C) (Oral)  Ht 5\' 10"  (1.778 m)  Wt 306 lb (138.801 kg)  BMI 43.91 kg/m2  SpO2 96% VS noted,  Constitutional: Pt appears in no significant distress HENT: Head: NCAT.  Right  Ear: External ear normal.  Left Ear: External ear normal.  Eyes: . Pupils are equal, round, and reactive to light. Conjunctivae and EOM are normal Neck: Normal range of motion. Neck supple.  Cardiovascular: Normal rate and irregular rhythm.   Pulmonary/Chest: Effort normal and breath sounds without rales or wheezing.  Neurological: Pt is alert. Not confused , motor grossly intact Skin: Skin is warm. + numerous erythem superfic up to 8 mm lesions rash, mild tender to LE's and several to abdomen wall as well, no LE edema Psychiatric: Pt behavior is normal. No agitation.     Assessment & Plan:

## 2015-05-26 NOTE — Assessment & Plan Note (Signed)
stable overall by history and exam, recent data reviewed with pt, and pt to continue medical treatment as before,  to f/u any worsening symptoms or concerns BP Readings from Last 3 Encounters:  05/26/15 122/80  05/23/15 124/68  05/10/15 128/64

## 2015-05-26 NOTE — Assessment & Plan Note (Signed)
stable overall by history and exam, recent data reviewed with pt, and pt to continue medical treatment as before,  to f/u any worsening symptoms or concerns Lab Results  Component Value Date   HGBA1C 5.8 03/07/2015

## 2015-05-26 NOTE — Telephone Encounter (Signed)
Rx faxed to pharmacy, pt informed.  

## 2015-05-26 NOTE — Patient Instructions (Addendum)
Please take all new medication as prescribed  Please continue all other medications as before, and refills have been done if requested.  Please have the pharmacy call with any other refills you may need.  Please continue your efforts at being more active, low cholesterol diet, and weight control.  Please keep your appointments with your specialists as you may have planned  Please return in 6 months, or sooner if needed, with Lab testing done 3-5 days before

## 2015-05-26 NOTE — Assessment & Plan Note (Signed)
liekly staph infection  - for doxy course,  to f/u any worsening symptoms or concerns

## 2015-05-27 ENCOUNTER — Other Ambulatory Visit (HOSPITAL_COMMUNITY): Payer: Self-pay | Admitting: Nurse Practitioner

## 2015-05-27 LAB — CUP PACEART REMOTE DEVICE CHECK: Date Time Interrogation Session: 20160812154911

## 2015-05-31 NOTE — Patient Outreach (Signed)
[facility]) Care Management  05/31/2015  JAHAN FRIEDLANDER 26-Apr-1956 [id number]   Telephone outreach to patient to check the status of the [place] patient assistance application. Patient states he's not heard from the company or received any correspondence in the mail. Provided patient with new office phone number and he will call once he hears from company.  Donella Pascarella L. Ayelet Gruenewald, [place] Care Management Assistant

## 2015-06-02 ENCOUNTER — Encounter: Payer: Self-pay | Admitting: Internal Medicine

## 2015-06-02 ENCOUNTER — Ambulatory Visit (INDEPENDENT_AMBULATORY_CARE_PROVIDER_SITE_OTHER): Payer: Commercial Managed Care - HMO | Admitting: Internal Medicine

## 2015-06-02 VITALS — BP 86/56 | HR 96 | Ht 70.0 in | Wt 297.0 lb

## 2015-06-02 DIAGNOSIS — Z4509 Encounter for adjustment and management of other cardiac device: Secondary | ICD-10-CM

## 2015-06-02 DIAGNOSIS — I481 Persistent atrial fibrillation: Secondary | ICD-10-CM | POA: Diagnosis not present

## 2015-06-02 DIAGNOSIS — Z79899 Other long term (current) drug therapy: Secondary | ICD-10-CM

## 2015-06-02 DIAGNOSIS — I4819 Other persistent atrial fibrillation: Secondary | ICD-10-CM

## 2015-06-02 MED ORDER — DILTIAZEM HCL ER COATED BEADS 240 MG PO CP24: ORAL_CAPSULE | ORAL | Status: DC

## 2015-06-02 MED ORDER — TORSEMIDE 20 MG PO TABS: 60.0000 mg | ORAL_TABLET | Freq: Every day | ORAL | Status: DC

## 2015-06-02 MED ORDER — DIGOXIN 250 MCG PO TABS: 0.2500 mg | ORAL_TABLET | Freq: Every day | ORAL | Status: DC

## 2015-06-02 NOTE — Progress Notes (Signed)
Patient Care Team: [name], MD as PCP - [place], RD as Dietitian Thana Ates, RN as [place] Management [place] as [place] [facility]   HPI  [name] is a 59 y.o. male   seen in followup for atrial fibrillation for which he is treated with amiodarone.  There was some concern regarding dyspnea so he underwent pulmonary function testing by Dr. Gwenette Greet   Echocardiogram 7/13 demonstrated EF 20-25% with severe biatrial enlargement. Repeat echo 8/14 and demonstrated an intercurrent normalization of LV function with biatrial enlargement and mild RV dilatation    Myoview 2012 demonstrated an ejection fraction of 44% with a prior inferior wall infarct Myoview 2015 demonstrated an ejection fraction 57% with an inferior wall perfusion defect question bowel  He had right bundle branch block  He has had a significant interval couple of months with recurrent problems with atrial fibrillation. 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 This failed. Ultimately his amiodarone was discontinued and metoprolol switched to bisoprolol try to minimize negative impact on his lungs.  He has been treated with Tapazole for his hyperthyroidism. He has had an intercurrent hospitalization for syncope for which [id number] was associated  He continues to struggle with shortness of breath and peripheral edema.  Myoview 6/16 was reviewed and demonstrated  EF 35-40% without ischemia or prior inferior wall MI  Echo 6/16 40-45% with severe biatrial enlargement ; atrial enlargement dates back at least a couple of years.     Past Medical History  Diagnosis Date  . DYSKINESIA, ESOPHAGUS   . FIBROMYALGIA   . HYPERLIPIDEMIA     a. Intolerant to statins.  . INSOMNIA     takes Ambien nightly  . Rash and other  nonspecific skin eruption 04/12/2009    no cause found saw dermatologists x 2 and allergist  . Paroxysmal atrial fibrillation     a. CHA2DS2VASc = 3--> takes Coumadin;  b. 03/15/2015 Successful TEE/DCCV;  c. 03/2015 recurrent afib, Amio d/c'd in setting of hyperthyroidism.  . Cardiomyopathy     a. EF 25% TEE July 2013; b. EF normalized 2015;  c. 03/2015 Echo: EF 40-45%, difrf HK, PASP 38 mmHg, Mild MR, sev LAE/RAE.  [name] Myocardial infarction     a. 2012 Myoview notable for prior infarct;  b. 03/2015 Lexiscan CL: EF 37%, diff HK, small area of inferior infarct from apex to base-->Med Rx.  . ANXIETY     takes Xanax nightly  . ASTHMA     Albuterol prn and Advair daily;also takes Prednisone daily  . GERD (gastroesophageal reflux disease)        . Type II diabetes mellitus        . Essential hypertension        . COPD (chronic obstructive pulmonary disease)   . O2 dependent     uses oxygen 2 and 1/2 liters per [place] at hs  . SLEEP APNEA, OBSTRUCTIVE     a. doesn't use CPAP  . Peripheral neuropathy   . Arthritis   . Chronic constipation     takes OTC stool softener  . Diverticulitis   . Glaucoma   . DEPRESSION     takes Zoloft and Doxepin daily  . Anemia     supposed to be taking Vit B but doesn't  . Syncope  a. 03/2015 s/p MDT LINQ.    Past Surgical History  Procedure Laterality Date  . Tonsillectomy    . Acne cyst removal      2 on back   . Cardioversion  04/18/2012    Procedure: CARDIOVERSION;  Surgeon: Fay Records, MD;  Location: [place];  Service: Cardiovascular;  Laterality: N/A;  . Tee without cardioversion  04/25/2012    Procedure: TRANSESOPHAGEAL ECHOCARDIOGRAM (TEE);  Surgeon: Thayer Headings, MD;  Location: [place];  Service: Cardiovascular;  Laterality: N/A;  . Cardioversion  04/25/2012    Procedure: CARDIOVERSION;  Surgeon: Thayer Headings, MD;  Location: [facility] ENDOSCOPY;  Service: Cardiovascular;  Laterality: N/A;  . Cardioversion  04/25/2012    Procedure: CARDIOVERSION;   Surgeon: Fay Records, MD;  Location: [place];  Service: Cardiovascular;  Laterality: N/A;  . Cardioversion  05/09/2012    Procedure: CARDIOVERSION;  Surgeon: Sherren Mocha, MD;  Location: [place];  Service: Cardiovascular;  Laterality: N/A;  changed from crenshaw to cooper by trish/leone-endo  . Colonoscopy    . Esophagogastroduodenoscopy    . Total knee arthroplasty Right 06/15/2014    Procedure: TOTAL KNEE ARTHROPLASTY;  Surgeon: Renette Butters, MD;  Location: [place];  Service: Orthopedics;  Laterality: Right;  . Colonoscopy with propofol N/A 10/21/2014    Procedure: COLONOSCOPY WITH PROPOFOL;  Surgeon: Ladene Artist, MD;  Location: [facility] ENDOSCOPY;  Service: Endoscopy;  Laterality: N/A;  . Tee without cardioversion N/A 03/15/2015    Procedure: TRANSESOPHAGEAL ECHOCARDIOGRAM (TEE);  Surgeon: Thayer Headings, MD;  Location: [place];  Service: Cardiovascular;  Laterality: N/A;  . Cardioversion N/A 03/15/2015    Procedure: CARDIOVERSION;  Surgeon: Thayer Headings, MD;  Location: [place];  Service: Cardiovascular;  Laterality: N/A;  . Ep implantable device N/A 04/06/2015    Procedure: Loop Recorder Insertion;  Surgeon: Evans Lance, MD;  Location: [place] [facility];  Service: Cardiovascular;  Laterality: N/A;    Current Outpatient Prescriptions  Medication Sig Dispense Refill  . albuterol (PROVENTIL, VENTOLIN) (5 MG/ML) 0.5% NEBU Take by nebulization continuous.    . ALPRAZolam (XANAX) 0.5 MG tablet TAKE 1 TABLET BY MOUTH TWICE A DAY AS NEEDED FOR ANXIETY 60 tablet 2  . b complex vitamins tablet Take 1 tablet by mouth daily.    . carisoprodol (SOMA) 350 MG tablet TAKE 1 TABLET BY MOUTH 3 TIMES A DAY AS NEEDED 90 tablet 2  . clobetasol cream (TEMOVATE) 4.09 % Apply 1 application topically daily as needed (itching). 30 g 1  . diltiazem (CARDIZEM CD) 240 MG 24 hr capsule TAKE 1 CAPSULE (240 MG TOTAL) BY MOUTH DAILY. 30 capsule 6  . diphenhydrAMINE (BENADRYL) 50 MG tablet Take 1 tablet (50 mg  total) by mouth every 6 (six) hours as needed for itching. 30 tablet 2  . doxepin (SINEQUAN) 25 MG capsule Take 25 mg by mouth 2 (two) times daily.    [name] doxycycline (VIBRA-TABS) 100 MG tablet Take 1 tablet (100 mg total) by mouth 2 (two) times daily. 20 tablet 0  . ferrous sulfate 325 (65 FE) MG tablet Take 325 mg by mouth daily.    . Fluticasone-Salmeterol (ADVAIR DISKUS) 250-50 MCG/DOSE AEPB TAKE 1 PUFF BY MOUTH DAILY AS DIRECTED 60 each 3  . hydrOXYzine (ATARAX/VISTARIL) 25 MG tablet TAKE 1 TABLET BY MOUTH 3 TIMES A DAY FOR ITCHING 30 tablet 1  . ibuprofen (ADVIL,MOTRIN) 200 MG tablet Take 400 mg by mouth at bedtime as needed for mild pain (  pain).     . Linaclotide (LINZESS) 145 MCG CAPS capsule Take 145 mcg by mouth daily as needed (Crampy pain).     . Loratadine 10 MG CAPS Take 10 mg by mouth daily as needed (itching).     . methimazole (TAPAZOLE) 10 MG tablet Take 2 tablets (20 mg total) by mouth 2 (two) times daily. 120 tablet 1  . nebivolol (BYSTOLIC) 5 MG tablet Take 5 mg by mouth daily.    [name] omeprazole (PRILOSEC) 20 MG capsule Take 20 mg by mouth daily with supper.     . polyethylene glycol (MIRALAX / GLYCOLAX) packet Take 17 g by mouth daily as needed for mild constipation.    . potassium chloride SA (K-DUR,KLOR-CON) 20 MEQ tablet Take 2 tablets (40 mEq total) by mouth daily. 30 tablet 1  . PROAIR HFA 108 (90 BASE) MCG/ACT inhaler INHALE 2 PUFFS INTO THE LUNGS 4 TIMES A DAY 8.5 Inhaler 5  . tolnaftate (TINACTIN) 1 % powder Apply 1 application topically 2 (two) times daily. Apply to Groin area    . torsemide (DEMADEX) 20 MG tablet TAKE 2 TABLETS BY MOUTH TWICE A DAY 60 tablet 5  . traZODone (DESYREL) 100 MG tablet TAKE 1/2 TABLET BY MOUTH AT BEDTIME AS NEEDED 45 tablet 1  . warfarin (COUMADIN) 5 MG tablet TAKE AS DIRECTED BY ANTICOAGULATION CLINIC 30 tablet 3  . witch hazel-glycerin (TUCKS) pad Apply 1 application topically as needed for itching, irritation or hemorrhoids.     [name]  zolpidem (AMBIEN) 10 MG tablet Take 1 tablet (10 mg total) by mouth at bedtime. 30 tablet 5  . [DISCONTINUED] atorvastatin (LIPITOR) 20 MG tablet Take 1 tablet (20 mg total) by mouth daily. 30 tablet 11  . [DISCONTINUED] gabapentin (NEURONTIN) 300 MG capsule Take 1 capsule (300 mg total) by mouth 3 (three) times daily. 90 capsule 5   No current facility-administered medications for this visit.    Allergies  Allergen Reactions  . Pravastatin Sodium Other (See Comments)    myalgia  . Simvastatin Other (See Comments)    severe leg cramps  . Statins Other (See Comments)    myalgia  . Tape Other (See Comments)    Skin Tears Use Paper Tape Only    Review of Systems negative except from HPI and PMH  Physical Exam BP 86/56 mmHg  Pulse 96  Ht 5\' 10"  (1.778 m)  Wt 297 lb (134.718 kg)  BMI 42.61 kg/m2 Well developed andmorbidly obese in no acute distress HENT normal E scleral and icterus clear JVP not discernible Neck Supple Clear to ausculation Irregularly  irregular rate and rhythm, no murmurs gallops or rub Soft with active bowel sounds No clubbing cyanosis  2+Edema Alert and oriented, grossly normal motor and sensory function Affect flat Skin Warm and Dry ECG demonstrates atrial fibrillation at 96 Intervals-/18/44 Axis left -87 Right bundle branch block left anterior fascicular block   Assessment and  Plan  Ischemic heart disease with prior MI by Myoview  HFpEF   Cardiomyopathy-ischemic/nonischemic- resolved  Atrial fibrillation persistent  Dyspnea on exertion/ HFrEF  Hyperthyroid    I am not able to access the records from endocrinology. But his Tapazole was recently increased eye which I presume that his hyperthyroid state is not yet coming under control. His atrial fibrillation remains rapid with a mean rate in the high 80s and excursions over 200. There is evidence of volume overload. There has been some interval decrease in LV systolic function.  Hence, we  will add digoxin back at 0.25 mg. We will need to check a level in about 2 weeks.  We will have him take his Cardizem at night as he is having some problems with hypotension;   this also is a justification for resuming his digoxin.  He is volume overloaded; we will increase his Demadex from 40 twice a day to 60 once a day. His uroflow is a challenge oral have him follow-up with Dr. [name] regarding the potential use of an alpha-blocker; this would be done with some trepidation given his resting low blood pressure.  I'm also not sanguine that even once we get him euthyroid that we will be able to restore sinus rhythm given his left atrial size. Having said that however, he has been in sinus rhythm for a good deal of time somewhat surprisingly

## 2015-06-02 NOTE — Patient Instructions (Addendum)
Medication Instructions:  Your physician has recommended you make the following change in your medication:  1) CHANGE Diltiazem - take at bedtime 2) INCREASE Torsemide to 60 mg daily 3) START Digoxin 0.25 mg daily  Labwork: Your physician recommends that you return for lab work in: 3 weeks for Digoxin level  Testing/Procedures: None ordered  Follow-Up: Your physician recommends that you schedule a follow-up appointment in: 4 weeks with Chanetta Marshall, NP   Any Other Special Instructions Will Be Listed Below (If Applicable). Thank you for choosing [facility]!!

## 2015-06-03 ENCOUNTER — Other Ambulatory Visit: Payer: Self-pay | Admitting: Pharmacist

## 2015-06-03 LAB — CUP PACEART INCLINIC DEVICE CHECK
Date Time Interrogation Session: 20160818140914
MDC IDC SET ZONE DETECTION INTERVAL: 3000 ms
Zone Setting Detection Interval: 2000 ms
Zone Setting Detection Interval: 350 ms

## 2015-06-03 NOTE — Patient Outreach (Signed)
[facility]) Care Management  [place]   06/03/2015  [name] 29-Jan-1956 [id number]  Subjective: [name] is a 59 y.o. male who is known to pharmacy and has been re-referred for a new medication review due to the number of medications that the patient is on and because the patient is having dizziness.  I have completed a chart review and patient has difficult to management atrial fibrillation and thyrotoxicosis (toxicity from long-term amiodarone use). Both of these conditions are contributing to the dizziness and his endocrinologist and cardiologist are both carefully following the patient and trying to adjust medications to control these disease states and improve the dizziness.  Objective:   Current Medications: Current Outpatient Prescriptions  Medication Sig Dispense Refill  . albuterol (PROVENTIL, VENTOLIN) (5 MG/ML) 0.5% NEBU Take by nebulization continuous.    . ALPRAZolam (XANAX) 0.5 MG tablet TAKE 1 TABLET BY MOUTH TWICE A DAY AS NEEDED FOR ANXIETY 60 tablet 2  . b complex vitamins tablet Take 1 tablet by mouth daily.    . carisoprodol (SOMA) 350 MG tablet TAKE 1 TABLET BY MOUTH 3 TIMES A DAY AS NEEDED 90 tablet 2  . clobetasol cream (TEMOVATE) 0.24 % Apply 1 application topically daily as needed (itching). 30 g 1  . digoxin (LANOXIN) 0.25 MG tablet Take 1 tablet (0.25 mg total) by mouth daily. 30 tablet 3  . diltiazem (CARDIZEM CD) 240 MG 24 hr capsule TAKE 1 CAPSULE (240 MG TOTAL) BY MOUTH DAILY AT BEDTIME 30 capsule 6  . diphenhydrAMINE (BENADRYL) 50 MG tablet Take 1 tablet (50 mg total) by mouth every 6 (six) hours as needed for itching. 30 tablet 2  . doxepin (SINEQUAN) 25 MG capsule Take 25 mg by mouth 2 (two) times daily.    [name] doxycycline (VIBRA-TABS) 100 MG tablet Take 1 tablet (100 mg total) by mouth 2 (two) times daily. 20 tablet 0  . ferrous sulfate 325 (65 FE) MG tablet Take 325 mg by mouth daily.    . Fluticasone-Salmeterol  (ADVAIR DISKUS) 250-50 MCG/DOSE AEPB TAKE 1 PUFF BY MOUTH DAILY AS DIRECTED 60 each 3  . hydrOXYzine (ATARAX/VISTARIL) 25 MG tablet TAKE 1 TABLET BY MOUTH 3 TIMES A DAY FOR ITCHING 30 tablet 1  . ibuprofen (ADVIL,MOTRIN) 200 MG tablet Take 400 mg by mouth at bedtime as needed for mild pain (pain).     . Linaclotide (LINZESS) 145 MCG CAPS capsule Take 145 mcg by mouth daily as needed (Crampy pain).     . Loratadine 10 MG CAPS Take 10 mg by mouth daily as needed (itching).     . methimazole (TAPAZOLE) 10 MG tablet Take 2 tablets (20 mg total) by mouth 2 (two) times daily. 120 tablet 1  . nebivolol (BYSTOLIC) 5 MG tablet Take 5 mg by mouth daily.    [name] omeprazole (PRILOSEC) 20 MG capsule Take 20 mg by mouth daily with supper.     . polyethylene glycol (MIRALAX / GLYCOLAX) packet Take 17 g by mouth daily as needed for mild constipation.    . potassium chloride SA (K-DUR,KLOR-CON) 20 MEQ tablet Take 2 tablets (40 mEq total) by mouth daily. 30 tablet 1  . PROAIR HFA 108 (90 BASE) MCG/ACT inhaler INHALE 2 PUFFS INTO THE LUNGS 4 TIMES A DAY 8.5 Inhaler 5  . tolnaftate (TINACTIN) 1 % powder Apply 1 application topically 2 (two) times daily. Apply to Groin area    . torsemide (DEMADEX) 20 MG tablet Take 3 tablets (  60 mg total) by mouth daily. 90 tablet 2  . traZODone (DESYREL) 100 MG tablet TAKE 1/2 TABLET BY MOUTH AT BEDTIME AS NEEDED 45 tablet 1  . warfarin (COUMADIN) 5 MG tablet TAKE AS DIRECTED BY ANTICOAGULATION CLINIC 30 tablet 3  . witch hazel-glycerin (TUCKS) pad Apply 1 application topically as needed for itching, irritation or hemorrhoids.     [name] zolpidem (AMBIEN) 10 MG tablet Take 1 tablet (10 mg total) by mouth at bedtime. 30 tablet 5  . [DISCONTINUED] atorvastatin (LIPITOR) 20 MG tablet Take 1 tablet (20 mg total) by mouth daily. 30 tablet 11  . [DISCONTINUED] gabapentin (NEURONTIN) 300 MG capsule Take 1 capsule (300 mg total) by mouth 3 (three) times daily. 90 capsule 5   No current  facility-administered medications for this visit.    Functional Status: In your present state of health, do you have any difficulty performing the following activities: 04/25/2015 04/11/2015  Hearing? N N  Vision? Y Y  Difficulty concentrating or making decisions? Tempie Donning  Walking or climbing stairs? Y Y  Dressing or bathing? N N  Doing errands, shopping? Tempie Donning  Preparing Food and eating ? N N  Using the Toilet? N N  In the past six months, have you accidently leaked urine? N N  Do you have problems with loss of bowel control? N N  Managing your Medications? N N  Managing your Finances? N N  Housekeeping or managing your Housekeeping? Tempie Donning    Fall/Depression Screening: [facility] 2/9 Scores 04/25/2015 04/11/2015 02/23/2015 02/16/2014  PHQ - 2 Score 0 0 0 1  Exception Documentation Patient refusal - - -    Assessment:  Drugs sorted by system:  Neurologic/Psychologic: alprazolam, trazodone, zolpidem, doxepin  Cardiovascular: digoxin, diltiazem, nebivolol, torsemide, warfarin  Pulmonary/Allergy: albuterol, diphenhydramine, Advair, hydroxyzine, loratadine,   Gastrointestinal: linaclotide, omeprazole, polyethylene glycol  Endocrine: methimazole  Renal:  Topical: clobestasol cream, tolnaftate powder, witch hazel-glycerin (TUCKS pads)  Pain: carisoprodol, ibuprofen,  Miscellaneous: vitamin B, doxycycline (for rash/cellulitis), ferrous sulfate, potassium chloride   Duplications in therapy: trazodone and zolpidem are both sleep agents Gaps in therapy: aspirin but patient is on warfarin, patient is intolerant of statins, patient has had issues with hypotension so ACEi not indicated at this time Medications to avoid in the elderly: patient <63 years old Drug interactions: none noted, though INR can be impacted by many medications Other issues noted: Patient is on a lot of medications but has difficult to control disease states that are requiring a lot of medications to treat. I think that his  syncope is related to the atrial fibrillation and hyperthyroidism though he is on medications that could contribute.   Plan: 1. Medication review: as above, patient is on a lot of medications but he has so many comorbidities that require these medications. I think that the cardiologist and endocrinologist should continue to manage the syncope as they see fit and once those changes are made, if the syncope is still present, then I would consider adjusting medications. Discussed this with Earnestine Mealing from [facility] who referred this patient to [facility] for a medication consult.   Nicoletta Ba, PharmD, [facility] [phone]

## 2015-06-06 ENCOUNTER — Ambulatory Visit (INDEPENDENT_AMBULATORY_CARE_PROVIDER_SITE_OTHER): Payer: Commercial Managed Care - HMO | Admitting: *Deleted

## 2015-06-06 DIAGNOSIS — R55 Syncope and collapse: Secondary | ICD-10-CM

## 2015-06-08 NOTE — Progress Notes (Signed)
Loop recorder 

## 2015-06-13 ENCOUNTER — Other Ambulatory Visit: Payer: Self-pay

## 2015-06-13 ENCOUNTER — Telehealth: Payer: Self-pay | Admitting: *Deleted

## 2015-06-13 DIAGNOSIS — G4733 Obstructive sleep apnea (adult) (pediatric): Secondary | ICD-10-CM | POA: Diagnosis not present

## 2015-06-13 LAB — CUP PACEART REMOTE DEVICE CHECK: Date Time Interrogation Session: 20160823040500

## 2015-06-13 NOTE — Patient Outreach (Signed)
[facility]) Care Management   06/13/2015  [name] 1955/11/19 [id number]  [name] is an 59 y.o. male  Arrived at 10:30 am for home visit. Daughter [name] answered the door. Reports that she can see an improvement in patient over the last month.  Subjective: Patient reports that his breathing is better. States that he has been walking more ( atleast 3-4 times per week)  and has more energy. Reports decrease in tremors since MD increased his thyroid medications. Patient reports that he is in good spirits. Reports 1 fall since last home visit. States that he dropped his water bottle and bent over to pick it up and fell into bookcase. Denied any injury from fall.  Patient reports that weight is unchanged. Range is 296-298 pounds.  Patient reports that he was diagnosed with Staph on his lower legs and took doxycyline for 4-5 weeks. Has completed his course. Reports itchy legs. Encouraged patient not to scratch legs.   Objective:   Awake and alert. Ambulating without difficulty. Appears more energetic than he was last month. Appears happy with his progress.  Filed Vitals:   06/13/15 1100  BP: 138/58  Pulse: 60  Resp: 20  Weight: 297 lb (134.718 kg)  SpO2: 97%   Review of Systems  Constitutional: Negative.   HENT:       Reports being hoarse ( voice) when taking his advair daily.  Eyes: Negative.   Respiratory: Negative.   Cardiovascular: Positive for leg swelling.  Gastrointestinal: Negative.   Genitourinary: Negative.   Musculoskeletal: Negative.   Skin:       Patient reports itching to his lower legs.  Neurological: Positive for tremors.  Endo/Heme/Allergies: Bruises/bleeds easily.  Psychiatric/Behavioral: Negative.     Physical Exam  Constitutional: He is oriented to person, place, and time. He appears well-developed and well-nourished.  Cardiovascular: Intact distal pulses.   Irregular rhythm  Respiratory: Effort normal and breath sounds normal. No  respiratory distress.  GI: Soft. Bowel sounds are normal.  Musculoskeletal: Normal range of motion. He exhibits edema.  Trace edema to the lower legs  Neurological: He is alert and oriented to person, place, and time.  Skin: Skin is warm and dry.  Scattered bruising noted to the both arms.  Lower legs with darkened discoloration. ( not changed since last home visit)  Psychiatric: He has a normal mood and affect. His behavior is normal. Judgment and thought content normal.    Current Medications:   Current Outpatient Prescriptions  Medication Sig Dispense Refill  . albuterol (PROVENTIL, VENTOLIN) (5 MG/ML) 0.5% NEBU Take by nebulization continuous.    . ALPRAZolam (XANAX) 0.5 MG tablet TAKE 1 TABLET BY MOUTH TWICE A DAY AS NEEDED FOR ANXIETY 60 tablet 2  . b complex vitamins tablet Take 1 tablet by mouth daily.    . carisoprodol (SOMA) 350 MG tablet TAKE 1 TABLET BY MOUTH 3 TIMES A DAY AS NEEDED 90 tablet 2  . clobetasol cream (TEMOVATE) 9.93 % Apply 1 application topically daily as needed (itching). 30 g 1  . digoxin (LANOXIN) 0.25 MG tablet Take 1 tablet (0.25 mg total) by mouth daily. 30 tablet 3  . diltiazem (CARDIZEM CD) 240 MG 24 hr capsule TAKE 1 CAPSULE (240 MG TOTAL) BY MOUTH DAILY AT BEDTIME 30 capsule 6  . doxepin (SINEQUAN) 25 MG capsule Take 25 mg by mouth 2 (two) times daily.    . ferrous sulfate 325 (65 FE) MG tablet Take 325 mg by mouth  daily.    . Fluticasone-Salmeterol (ADVAIR DISKUS) 250-50 MCG/DOSE AEPB TAKE 1 PUFF BY MOUTH DAILY AS DIRECTED 60 each 3  . ibuprofen (ADVIL,MOTRIN) 200 MG tablet Take 400 mg by mouth at bedtime as needed for mild pain (pain).     . Linaclotide (LINZESS) 145 MCG CAPS capsule Take 145 mcg by mouth daily as needed (Crampy pain).     . Loratadine 10 MG CAPS Take 10 mg by mouth daily as needed (itching).     . methimazole (TAPAZOLE) 10 MG tablet Take 2 tablets (20 mg total) by mouth 2 (two) times daily. 120 tablet 1  . nebivolol (BYSTOLIC) 5 MG  tablet Take 5 mg by mouth daily.    [name] omeprazole (PRILOSEC) 20 MG capsule Take 20 mg by mouth daily with supper.     . polyethylene glycol (MIRALAX / GLYCOLAX) packet Take 17 g by mouth daily as needed for mild constipation.    . potassium chloride SA (K-DUR,KLOR-CON) 20 MEQ tablet Take 2 tablets (40 mEq total) by mouth daily. 30 tablet 1  . PROAIR HFA 108 (90 BASE) MCG/ACT inhaler INHALE 2 PUFFS INTO THE LUNGS 4 TIMES A DAY 8.5 Inhaler 5  . tolnaftate (TINACTIN) 1 % powder Apply 1 application topically 2 (two) times daily. Apply to Groin area    . torsemide (DEMADEX) 20 MG tablet Take 3 tablets (60 mg total) by mouth daily. 90 tablet 2  . traZODone (DESYREL) 100 MG tablet TAKE 1/2 TABLET BY MOUTH AT BEDTIME AS NEEDED 45 tablet 1  . warfarin (COUMADIN) 5 MG tablet TAKE AS DIRECTED BY ANTICOAGULATION CLINIC 30 tablet 3  . witch hazel-glycerin (TUCKS) pad Apply 1 application topically as needed for itching, irritation or hemorrhoids.     [name] zolpidem (AMBIEN) 10 MG tablet Take 1 tablet (10 mg total) by mouth at bedtime. 30 tablet 5  . diphenhydrAMINE (BENADRYL) 50 MG tablet Take 1 tablet (50 mg total) by mouth every 6 (six) hours as needed for itching. (Patient not taking: Reported on 06/13/2015) 30 tablet 2  . doxycycline (VIBRA-TABS) 100 MG tablet Take 1 tablet (100 mg total) by mouth 2 (two) times daily. (Patient not taking: Reported on 06/13/2015) 20 tablet 0  . hydrOXYzine (ATARAX/VISTARIL) 25 MG tablet TAKE 1 TABLET BY MOUTH 3 TIMES A DAY FOR ITCHING (Patient not taking: Reported on 06/13/2015) 30 tablet 1  . [DISCONTINUED] atorvastatin (LIPITOR) 20 MG tablet Take 1 tablet (20 mg total) by mouth daily. 30 tablet 11  . [DISCONTINUED] gabapentin (NEURONTIN) 300 MG capsule Take 1 capsule (300 mg total) by mouth 3 (three) times daily. 90 capsule 5   No current facility-administered medications for this visit.    Functional Status:   In your present state of health, do you have any difficulty  performing the following activities: 04/25/2015 04/11/2015  Hearing? N N  Vision? Y Y  Difficulty concentrating or making decisions? Tempie Donning  Walking or climbing stairs? Y Y  Dressing or bathing? N N  Doing errands, shopping? Tempie Donning  Preparing Food and eating ? N N  Using the Toilet? N N  In the past six months, have you accidently leaked urine? N N  Do you have problems with loss of bowel control? N N  Managing your Medications? N N  Managing your Finances? N N  Housekeeping or managing your Housekeeping? Tempie Donning    Fall/Depression Screening:    PHQ 2/9 Scores 04/25/2015 04/11/2015 02/23/2015 02/16/2014  PHQ - 2 Score 0 0 0  1  Exception Documentation Patient refusal - - -    Assessment:   (1) weight unchanged. (2) Breathing has improved. (3) itchy skin to lower legs. (4) Very knowledgeable about when to call MD. Able to self manage his own medical care very well. Good support system with his daughter. (5) no new problems or needs identified.  Plan:  (1) Patient will continue to weigh and call MD with weight changes. (2) Patient will continue to take medications as prescribed. (3) Encouraged patient to not scratch. Encouraged patient to call primary MD for continued leg concerns. (4) Patient will continue to navigate the health system as he is doing independently. (5) Discussed case closure with patient as no new needs or goals identified. Patient is in agreement for case closure. Reviewed with patient the option to call anytime he is struggling to have case reopened.  Patient is confident that he can self manage at this time. Patient is very appreciative of assistance from [facility].  THN CM Care Plan Problem One        Most Recent Value   Care Plan Problem One  Patient with recent admission due to heart failure   Role Documenting the Problem One  Care Management [place] for Problem One  Active   THN Long Term Goal (31-90 days)  Patient will be able to report no readmissions in the  next 31 days.   THN Long Term Goal Start Date  04/07/15 [discharge date of 04/06/2015]   [facility] Long Term Goal Met Date  05/10/15 [facility] Folk met. no readmissions]   Interventions for Problem One Long Term Goal  Discussed importance of avoiding salty food and snacks.offered other healthy suggestions   THN CM Short Term Goal #1 (0-30 days)  Patient will follow up with primary MD within 7-10 days from hospital discharge.   THN CM Short Term Goal #1 Start Date  04/07/15   [facility] CM Short Term Goal #1 Met Date  04/19/15 [had follow up with cardiologist,  goal met]   Interventions for Short Term Goal #1  Patient has follow up planned. Reinforced importance of taking meds as prescribed.    THN CM Short Term Goal #2 (0-30 days)  Patient will report decreased amount of drainage from loop recorder insertion site in the next 5 days.   THN CM Short Term Goal #2 Start Date  04/07/15 [patient reports small amounts of drainage]   THN CM Short Term Goal #2 Met Date  04/11/15 [facility] met. no draininage in the lst 3 days.]   Interventions for Short Term Goal #2  goal met    [facility] CM Care Plan Problem Two        Most Recent Value   Care Plan Problem Two  Knowledge deficit related to CHF as evidence by lack of understanding of importance.   Role Documenting the Problem Two  Care Management [place] for Problem Two  Active   Interventions for Problem Two Long Term Goal   increased weight today. Enocuraged low salt options. discussed action plan if weight continues to increase.   THN Long Term Goal (31-90) days  Patient will be able to verbalize better control of heart failure symptoms and will be able to verbalize when to call MD in the next 60 days.   THN Long Term Goal Start Date  04/11/15   [facility] Long Term Goal Met Date  06/13/15 [facility] met. patient is confident about when call MD]  THN CM Short Term Goal #1 (0-30 days)  Patient will be able to report weighing daily for the next 30 days.   THN CM Short Term Goal  #1 Start Date  04/11/15   [facility] CM Short Term Goal #1 Met Date   05/10/15 [goal met. Reviewed calendar with daily weights range 296-297]   Interventions for Short Term Goal #2   Discussed importance of continuing to weigh daily and report weight gain to MD   [facility] CM Short Term Goal #2 (0-30 days)   Patient will be able to report understanding of the correct meds to take in the next 5 days.   THN CM Short Term Goal #2 Start Date  04/11/15   [facility] CM Short Term Goal #2 Met Date  04/19/15 [facility] met]   Interventions for Short Term Goal #2  Reviewed all meds with patient during home visit, Sent a referral to [facility] for med review. Will contact MD office to get clarification of correct meds.   THN CM Short Term Goal #3 (0-30 days)  Patient will verbalize starting an exercise program in the next 30 days. Patients goal is to walk 100 feet continuously in home 5 times per week for 30 days.   THN CM Short Term Goal #3 Start Date  05/10/15   [facility] CM Short Term Goal #3 Met Date  06/13/15 [facility] met. patient verbalizes increased walking and activity.]   Interventions for Short Term Goal #3  Measured out 25 feet in home. Education officer, museum to end of hallway). Patient will walk 4 laps. Encouraged paitent to record on his calendar  the days that he is able to exercise. Provded reassurance and guidiance to help patient prepare for exercise. Reviewed reasons to rest like dizziness, or weakness.  Discussed beneifts of increased energy by starting to exercise.     THN CM Care Plan Problem Three        Most Recent Value   Care Plan Problem Three  Patient will concerns about medication cost out of pocket.   Role Documenting the Problem Three  Care Management Coordinator   Care Plan for Problem Three  Active   THN CM Short Term Goal #1 (0-30 days)  Patient will call [facility] pharmacy to discuss possible cost savings by switching [facility] to [facility] order in the next 30 days.   THN CM Short Term Goal #1 Start Date  05/10/15    [facility] CM Short Term Goal #1 Met Date  06/13/15 [facility] met.patient reviewed benefits and decided not to change]   Interventions for Short Term Goal #1  Encouraged patient to call and do a price comparsion with [facility] and [employer] mail order medications, Encouraged patient to notify MD if he enrolls in mail order pharmacy.       Will send MD update about case closure. Patient is remains active with [facility] pharmacy assistance at this time. Will notify Lurline Del via in basket and Damita Rhodie via in basket as well.  Tomasa Rand, RN, BSN, CEN [facility] [employer] [phone]

## 2015-06-13 NOTE — Telephone Encounter (Signed)
Pause episodes from 06/09/15 and 06/10/15 printed for review.  Episodes appropriate, each pause ~3 seconds.  Patient asymptomatic, states he was still asleep at these times.  Patient appreciative of call, denies any questions or concerns at this time.  Patient aware to call with worsening symptoms, questions, or concerns.

## 2015-06-15 NOTE — Patient Outreach (Signed)
[facility]) Care Management  06/15/2015  [name] [birth year] [id number]   Telephone outreach to patient to check the status of the [place] access application. Patient stated he has not heard from the company. We called the company on three-way, spoke with Juliann Pulse and she informed us they never received the fax from the primary care doctor's office when searching their database. We were informed that we can mail the application and they will process it within 2-3 business days. A letter of decision should be mailed to him after the processing of his application. Mr. Hopwood will contact me once he receives the letter of decision. I will follow up in about 2 weeks.  Purvis Sidle L. Carmin Alvidrez, [place] Care Management Assistant

## 2015-06-16 ENCOUNTER — Encounter: Payer: Self-pay | Admitting: Internal Medicine

## 2015-06-17 ENCOUNTER — Other Ambulatory Visit: Payer: Self-pay | Admitting: Internal Medicine

## 2015-06-21 ENCOUNTER — Ambulatory Visit (INDEPENDENT_AMBULATORY_CARE_PROVIDER_SITE_OTHER): Payer: Commercial Managed Care - HMO | Admitting: General Practice

## 2015-06-21 DIAGNOSIS — Z5181 Encounter for therapeutic drug level monitoring: Secondary | ICD-10-CM

## 2015-06-21 DIAGNOSIS — I4891 Unspecified atrial fibrillation: Secondary | ICD-10-CM | POA: Diagnosis not present

## 2015-06-21 LAB — POCT INR: INR: 3.9

## 2015-06-21 NOTE — Progress Notes (Signed)
Pre visit review using our clinic review tool, if applicable. No additional management support is needed unless otherwise documented below in the visit note. 

## 2015-06-21 NOTE — Progress Notes (Signed)
I have reviewed and agree with the plan. 

## 2015-06-22 ENCOUNTER — Encounter: Payer: Self-pay | Admitting: Internal Medicine

## 2015-06-23 ENCOUNTER — Other Ambulatory Visit: Payer: Commercial Managed Care - HMO

## 2015-06-27 ENCOUNTER — Other Ambulatory Visit: Payer: Self-pay | Admitting: *Deleted

## 2015-06-27 ENCOUNTER — Other Ambulatory Visit (INDEPENDENT_AMBULATORY_CARE_PROVIDER_SITE_OTHER): Payer: Commercial Managed Care - HMO

## 2015-06-27 ENCOUNTER — Ambulatory Visit (INDEPENDENT_AMBULATORY_CARE_PROVIDER_SITE_OTHER): Payer: Commercial Managed Care - HMO | Admitting: Endocrinology

## 2015-06-27 VITALS — BP 118/72 | HR 61 | Temp 97.8°F | Ht 70.0 in | Wt 304.4 lb

## 2015-06-27 DIAGNOSIS — Z23 Encounter for immunization: Secondary | ICD-10-CM

## 2015-06-27 DIAGNOSIS — E0590 Thyrotoxicosis, unspecified without thyrotoxic crisis or storm: Secondary | ICD-10-CM

## 2015-06-27 LAB — T3, FREE: T3, Free: 3.5 pg/mL (ref 2.3–4.2)

## 2015-06-27 LAB — T4, FREE: Free T4: 1.42 ng/dL (ref 0.60–1.60)

## 2015-06-27 NOTE — Progress Notes (Signed)
Patient ID: [name], male   DOB: 1956/05/30, 59 y.o.   MRN: [id number]                                                                                                               Reason for Appointment:  Hyperthyroidism, new consultation  Referring physician: [name]   History of Present Illness:   Since about May or June the patient has had symptoms of palpitations, shakiness, feeling excessively warm and sweaty, and fatigue.   His appetite has been normal.  His weight has been difficult to assess has because of his CHF it has fluctuated based on his fluid status Because of his taking amiodarone for 3 years he has had periodic monitoring of his thyroid levels About a year ago his TSH was normal but in 2016 it was found to be low in 5/16 Also during his hospitalization in 6/16 he was found to be hyperthyroid with significantly high free T4 levels  His amiodarone was stopped in June Since then he has been on methimazole, initially 10 mg and not 20 mg twice a day   Again he does not think he is feeling much better and still complains of above symptoms.  He does not have palpitations all the time No recent labs available after his dosage change   Lab Results  Component Value Date   FREET4 2.72* 05/23/2015   FREET4 4.79* 04/01/2015   FREET4 1.44 02/23/2015   TSH 0.011* 04/01/2015   TSH 0.04* 02/23/2015   TSH 0.01* 02/21/2015         Medication List       This list is accurate as of: 06/27/15  1:31 PM.  Always use your most recent med list.               ALPRAZolam 0.5 MG tablet  Commonly known as:  XANAX  TAKE 1 TABLET BY MOUTH TWICE A DAY AS NEEDED FOR ANXIETY     b complex vitamins tablet  Take 1 tablet by mouth daily.     calcium elemental as carbonate 400 MG tablet  Commonly known as:  BARIATRIC TUMS ULTRA     carisoprodol 350 MG tablet  Commonly known as:  SOMA  TAKE 1 TABLET BY MOUTH 3 TIMES A DAY AS NEEDED     clobetasol cream 0.05 %  Commonly  known as:  TEMOVATE  Apply 1 application topically daily as needed (itching).     digoxin 0.25 MG tablet  Commonly known as:  LANOXIN  Take 1 tablet (0.25 mg total) by mouth daily.     diltiazem 240 MG 24 hr capsule  Commonly known as:  CARDIZEM CD  TAKE 1 CAPSULE (240 MG TOTAL) BY MOUTH DAILY AT BEDTIME     diphenhydrAMINE 50 MG tablet  Commonly known as:  BENADRYL  Take 1 tablet (50 mg total) by mouth every 6 (six) hours as needed for itching.     doxepin 25 MG capsule  Commonly known as:  SINEQUAN  Take  25 mg by mouth 2 (two) times daily.     ferrous sulfate 325 (65 FE) MG tablet  Take 325 mg by mouth daily.     Fluticasone-Salmeterol 250-50 MCG/DOSE Aepb  Commonly known as:  ADVAIR DISKUS  TAKE 1 PUFF BY MOUTH DAILY AS DIRECTED     hydrOXYzine 25 MG tablet  Commonly known as:  ATARAX/VISTARIL  TAKE 1 TABLET BY MOUTH 3 TIMES A DAY FOR ITCHING     ibuprofen 200 MG tablet  Commonly known as:  ADVIL,MOTRIN  Take 400 mg by mouth at bedtime as needed for mild pain (pain).     Linaclotide 145 MCG Caps capsule  Commonly known as:  LINZESS  Take 145 mcg by mouth daily as needed (Crampy pain).     Loratadine 10 MG Caps  Take 10 mg by mouth daily as needed (itching).     methimazole 10 MG tablet  Commonly known as:  TAPAZOLE  Take 2 tablets (20 mg total) by mouth 2 (two) times daily.     nebivolol 5 MG tablet  Commonly known as:  BYSTOLIC  Take 5 mg by mouth daily.     omeprazole 20 MG capsule  Commonly known as:  PRILOSEC  Take 20 mg by mouth daily with supper.     polyethylene glycol packet  Commonly known as:  MIRALAX / GLYCOLAX  Take 17 g by mouth daily as needed for mild constipation.     potassium chloride SA 20 MEQ tablet  Commonly known as:  K-DUR,KLOR-CON  Take 2 tablets (40 mEq total) by mouth daily.     albuterol (5 MG/ML) 0.5% Nebu  Commonly known as:  PROVENTIL, VENTOLIN  Take by nebulization continuous.     PROAIR HFA 108 (90 BASE) MCG/ACT  inhaler  Generic drug:  albuterol  INHALE 2 PUFFS INTO THE LUNGS 4 TIMES A DAY     sertraline 100 MG tablet  Commonly known as:  ZOLOFT  Take 200 mg by mouth at bedtime.     tolnaftate 1 % powder  Commonly known as:  TINACTIN  Apply 1 application topically 2 (two) times daily. Apply to Groin area     torsemide 20 MG tablet  Commonly known as:  DEMADEX  Take 3 tablets (60 mg total) by mouth daily.     traZODone 100 MG tablet  Commonly known as:  DESYREL  TAKE 1/2 TABLET BY MOUTH AT BEDTIME AS NEEDED     warfarin 5 MG tablet  Commonly known as:  COUMADIN  TAKE AS DIRECTED BY ANTICOAGULATION CLINIC     witch hazel-glycerin pad  Commonly known as:  TUCKS  Apply 1 application topically as needed for itching, irritation or hemorrhoids.     zolpidem 10 MG tablet  Commonly known as:  AMBIEN  Take 1 tablet (10 mg total) by mouth at bedtime.            Past Medical History  Diagnosis Date  . DYSKINESIA, ESOPHAGUS   . FIBROMYALGIA   . HYPERLIPIDEMIA     a. Intolerant to statins.  . INSOMNIA     takes Ambien nightly  . Rash and other nonspecific skin eruption 04/12/2009    no cause found saw dermatologists x 2 and allergist  . Paroxysmal atrial fibrillation     a. CHA2DS2VASc = 3--> takes Coumadin;  b. 03/15/2015 Successful TEE/DCCV;  c. 03/2015 recurrent afib, Amio d/c'd in setting of hyperthyroidism.  . Cardiomyopathy     a. EF 25% TEE July  2013; b. EF normalized 2015;  c. 03/2015 Echo: EF 40-45%, difrf HK, PASP 38 mmHg, Mild MR, sev LAE/RAE.  [name] Myocardial infarction     a. 2012 Myoview notable for prior infarct;  b. 03/2015 Lexiscan CL: EF 37%, diff HK, small area of inferior infarct from apex to base-->Med Rx.  . ANXIETY     takes Xanax nightly  . ASTHMA     Albuterol prn and Advair daily;also takes Prednisone daily  . GERD (gastroesophageal reflux disease)        . Type II diabetes mellitus        . Essential hypertension        . COPD (chronic obstructive pulmonary  disease)   . O2 dependent     uses oxygen 2 and 1/2 liters per [place] at hs  . SLEEP APNEA, OBSTRUCTIVE     a. doesn't use CPAP  . Peripheral neuropathy   . Arthritis   . Chronic constipation     takes OTC stool softener  . Diverticulitis   . Glaucoma   . DEPRESSION     takes Zoloft and Doxepin daily  . Anemia     supposed to be taking Vit B but doesn't  . Syncope     a. 03/2015 s/p MDT LINQ.    Past Surgical History  Procedure Laterality Date  . Tonsillectomy    . Acne cyst removal      2 on back   . Cardioversion  04/18/2012    Procedure: CARDIOVERSION;  Surgeon: Fay Records, MD;  Location: [place];  Service: Cardiovascular;  Laterality: N/A;  . Tee without cardioversion  04/25/2012    Procedure: TRANSESOPHAGEAL ECHOCARDIOGRAM (TEE);  Surgeon: Thayer Headings, MD;  Location: [place];  Service: Cardiovascular;  Laterality: N/A;  . Cardioversion  04/25/2012    Procedure: CARDIOVERSION;  Surgeon: Thayer Headings, MD;  Location: [facility] ENDOSCOPY;  Service: Cardiovascular;  Laterality: N/A;  . Cardioversion  04/25/2012    Procedure: CARDIOVERSION;  Surgeon: Fay Records, MD;  Location: [place];  Service: Cardiovascular;  Laterality: N/A;  . Cardioversion  05/09/2012    Procedure: CARDIOVERSION;  Surgeon: Sherren Mocha, MD;  Location: [place];  Service: Cardiovascular;  Laterality: N/A;  changed from crenshaw to cooper by trish/leone-endo  . Colonoscopy    . Esophagogastroduodenoscopy    . Total knee arthroplasty Right 06/15/2014    Procedure: TOTAL KNEE ARTHROPLASTY;  Surgeon: Renette Butters, MD;  Location: [place];  Service: Orthopedics;  Laterality: Right;  . Colonoscopy with propofol N/A 10/21/2014    Procedure: COLONOSCOPY WITH PROPOFOL;  Surgeon: Ladene Artist, MD;  Location: [facility];  Service: Endoscopy;  Laterality: N/A;  . Tee without cardioversion N/A 03/15/2015    Procedure: TRANSESOPHAGEAL ECHOCARDIOGRAM (TEE);  Surgeon: Thayer Headings, MD;  Location: [place];  Service:  Cardiovascular;  Laterality: N/A;  . Cardioversion N/A 03/15/2015    Procedure: CARDIOVERSION;  Surgeon: Thayer Headings, MD;  Location: [place];  Service: Cardiovascular;  Laterality: N/A;  . Ep implantable device N/A 04/06/2015    Procedure: Loop Recorder Insertion;  Surgeon: Evans Lance, MD;  Location: [place] [facility];  Service: Cardiovascular;  Laterality: N/A;    Family History  Problem Relation Age of Onset  . COPD Mother   . Asthma Mother   . Colon polyps Mother   . Allergies Mother   . Hypothyroidism Mother   . Asthma Maternal Grandmother   . Colon cancer Neg Hx  Social History:  reports that he quit smoking about 7 years ago. His smoking use included Cigarettes. He has a 60 pack-year smoking history. He has never used smokeless tobacco. He reports that he does not drink alcohol or use illicit drugs.  Allergies:  Allergies  Allergen Reactions  . Pravastatin Sodium Other (See Comments)    myalgia  . Simvastatin Other (See Comments)    severe leg cramps  . Statins Other (See Comments)    myalgia  . Tape Other (See Comments)    Skin Tears Use Paper Tape Only    Review of Systems:  Review of Systems  Constitutional: Positive for malaise/fatigue.  Respiratory: Positive for shortness of breath.   Cardiovascular: Positive for palpitations.       Atrial fib since 2001       He still continues to have swelling in his legs especially left side.  Has been treated with diuretics by cardiologist He is asking about increasing dry mouth since his diuretics were changed  No history of renal problems or electrolyte abnormalities  Lab Results  Component Value Date   CREATININE 0.96 04/14/2015   BUN 30* 04/14/2015   NA 138 04/14/2015   K 4.4 04/14/2015   CL 102 04/14/2015   CO2 26 04/14/2015    No history of numbness or tingling in his feet.  He had been diagnosed to have mild diabetes but blood sugars have been relatively better and he has not taken  metformin that he had previously been given  Lab Results  Component Value Date   HGBA1C 5.8 03/07/2015     Examination:   BP 118/72 mmHg  Pulse 61  Temp(Src) 97.8 F (36.6 C)  Ht 5\' 10"  (1.778 m)  Wt 304 lb 6 oz (138.064 kg)  BMI 43.67 kg/m2  SpO2 95%   Neck: The thyroid is  palpable on swallowing, about twice normal on the right side and minimally on the left  Neurological: Deep tendon reflexes at biceps are slightly brisk Bilateral fine to coarse tremors are present.    Assessment/Plan:   Hyperthyroidism from long-term use of amiodarone in the setting of probable multinodular goiter   He has been on methimazole 20 mg twice a day and is subjectively not feeling any better although his heart rate appears to be improved Still has some tremor although not typical of hyperthyroidism  We will check his labs today and decide on further dosage adjustment Also potentially if he has resistant hyperthyroidism may consider adding potassium perchlorate   Eilis Chestnutt 06/27/2015, 1:31 PM

## 2015-06-27 NOTE — Progress Notes (Signed)
Quick Note:  Please let patient know that the lab result is normal and no change in dose needed ______

## 2015-07-05 ENCOUNTER — Ambulatory Visit (INDEPENDENT_AMBULATORY_CARE_PROVIDER_SITE_OTHER): Payer: Commercial Managed Care - HMO | Admitting: *Deleted

## 2015-07-05 ENCOUNTER — Encounter: Payer: Self-pay | Admitting: Internal Medicine

## 2015-07-05 DIAGNOSIS — R55 Syncope and collapse: Secondary | ICD-10-CM

## 2015-07-05 NOTE — Progress Notes (Signed)
Loop recorder 

## 2015-07-06 ENCOUNTER — Encounter: Payer: Self-pay | Admitting: *Deleted

## 2015-07-06 ENCOUNTER — Other Ambulatory Visit: Payer: Commercial Managed Care - HMO

## 2015-07-06 ENCOUNTER — Ambulatory Visit (INDEPENDENT_AMBULATORY_CARE_PROVIDER_SITE_OTHER): Payer: Commercial Managed Care - HMO | Admitting: Nurse Practitioner

## 2015-07-06 ENCOUNTER — Encounter: Payer: Self-pay | Admitting: Nurse Practitioner

## 2015-07-06 VITALS — BP 118/78 | HR 60 | Ht 70.0 in | Wt 299.0 lb

## 2015-07-06 DIAGNOSIS — I4819 Other persistent atrial fibrillation: Secondary | ICD-10-CM

## 2015-07-06 DIAGNOSIS — I5022 Chronic systolic (congestive) heart failure: Secondary | ICD-10-CM

## 2015-07-06 DIAGNOSIS — G4733 Obstructive sleep apnea (adult) (pediatric): Secondary | ICD-10-CM | POA: Diagnosis not present

## 2015-07-06 DIAGNOSIS — I481 Persistent atrial fibrillation: Secondary | ICD-10-CM | POA: Diagnosis not present

## 2015-07-06 LAB — BASIC METABOLIC PANEL
BUN: 14 mg/dL (ref 6–23)
CALCIUM: 9 mg/dL (ref 8.4–10.5)
CHLORIDE: 104 meq/L (ref 96–112)
CO2: 32 meq/L (ref 19–32)
Creatinine, Ser: 0.75 mg/dL (ref 0.40–1.50)
GFR: 112.7 mL/min (ref >60.00)
Glucose, Bld: 98 mg/dL (ref 70–99)
Potassium: 3.7 mEq/L (ref 3.5–5.1)
SODIUM: 140 meq/L (ref 135–145)

## 2015-07-06 NOTE — Patient Instructions (Signed)
Medication Instructions:   Your physician recommends that you continue on your current medications as directed. Please refer to the Current Medication list given to you today.    Labwork:  BMET AND DIGOXIN LEVEL    Testing/Procedures: NONE ORDER TODAY    Follow-Up:  2 MONTHS WITH DR [name] PHYS PACER CK    Any Other Special Instructions Will Be Listed Below (If Applicable).

## 2015-07-06 NOTE — Progress Notes (Signed)
Electrophysiology Office Note Date: 07/06/2015  ID:  [name], DOB Feb 15, 1956, MRN [id number]  PCP: [name], MD Electrophysiologist: [name]  CC: atrial fibrillation and syncope follow up  [name] is a 59 y.o. male seen today for Dr [name].  He presents today for routine electrophysiology followup.  He was seen by Dr [name] last month at which time Lanoxin was added and his diuretics were increased. The patient has not had significant change in symptoms since then. He has stable dyspnea on exertion.  He is aware of his AF but not overly symptomatic. He is still being followed by endocrinology for his thyroid. He denies chest pain, palpitations,  PND, orthopnea, nausea, vomiting, dizziness, syncope, edema, weight gain, or early satiety.  Past Medical History  Diagnosis Date  . DYSKINESIA, ESOPHAGUS   . FIBROMYALGIA   . HYPERLIPIDEMIA     a. Intolerant to statins.  . INSOMNIA     takes Ambien nightly  . Rash and other nonspecific skin eruption 04/12/2009    no cause found saw dermatologists x 2 and allergist  . Paroxysmal atrial fibrillation     a. CHA2DS2VASc = 3--> takes Coumadin;  b. 03/15/2015 Successful TEE/DCCV;  c. 03/2015 recurrent afib, Amio d/c'd in setting of hyperthyroidism.  . Cardiomyopathy     a. EF 25% TEE July 2013; b. EF normalized 2015;  c. 03/2015 Echo: EF 40-45%, difrf HK, PASP 38 mmHg, Mild MR, sev LAE/RAE.  [name] Myocardial infarction     a. 2012 Myoview notable for prior infarct;  b. 03/2015 Lexiscan CL: EF 37%, diff HK, small area of inferior infarct from apex to base-->Med Rx.  . ANXIETY     takes Xanax nightly  . ASTHMA     Albuterol prn and Advair daily;also takes Prednisone daily  . GERD (gastroesophageal reflux disease)        . Type II diabetes mellitus        . Essential hypertension        . COPD (chronic obstructive pulmonary disease)   . O2 dependent     uses oxygen 2 and 1/2 liters per [place] at hs  . SLEEP APNEA, OBSTRUCTIVE     a.  doesn't use CPAP  . Peripheral neuropathy   . Arthritis   . Chronic constipation     takes OTC stool softener  . Diverticulitis   . Glaucoma   . DEPRESSION     takes Zoloft and Doxepin daily  . Anemia     supposed to be taking Vit B but doesn't  . Syncope     a. 03/2015 s/p MDT LINQ.   Past Surgical History  Procedure Laterality Date  . Tonsillectomy    . Acne cyst removal      2 on back   . Cardioversion  04/18/2012    Procedure: CARDIOVERSION;  Surgeon: Fay Records, MD;  Location: [place];  Service: Cardiovascular;  Laterality: N/A;  . Tee without cardioversion  04/25/2012    Procedure: TRANSESOPHAGEAL ECHOCARDIOGRAM (TEE);  Surgeon: Thayer Headings, MD;  Location: [place];  Service: Cardiovascular;  Laterality: N/A;  . Cardioversion  04/25/2012    Procedure: CARDIOVERSION;  Surgeon: Thayer Headings, MD;  Location: [place];  Service: Cardiovascular;  Laterality: N/A;  . Cardioversion  04/25/2012    Procedure: CARDIOVERSION;  Surgeon: Fay Records, MD;  Location: [place];  Service: Cardiovascular;  Laterality: N/A;  . Cardioversion  05/09/2012    Procedure: CARDIOVERSION;  Surgeon: Sherren Mocha, MD;  [facility];  Service: Cardiovascular;  Laterality: N/A;  changed from crenshaw to cooper by trish/leone-endo  . Colonoscopy    . Esophagogastroduodenoscopy    . Total knee arthroplasty Right 06/15/2014    Procedure: TOTAL KNEE ARTHROPLASTY;  Surgeon: Renette Butters, MD;  Location: [place];  Service: Orthopedics;  Laterality: Right;  . Colonoscopy with propofol N/A 10/21/2014    Procedure: COLONOSCOPY WITH PROPOFOL;  Surgeon: Ladene Artist, MD;  Location: [facility];  Service: Endoscopy;  Laterality: N/A;  . Tee without cardioversion N/A 03/15/2015    Procedure: TRANSESOPHAGEAL ECHOCARDIOGRAM (TEE);  Surgeon: Thayer Headings, MD;  Location: [place];  Service: Cardiovascular;  Laterality: N/A;  . Cardioversion N/A 03/15/2015    Procedure: CARDIOVERSION;  Surgeon: Thayer Headings, MD;  Location: [place];  Service: Cardiovascular;  Laterality: N/A;  . Ep implantable device N/A 04/06/2015    Procedure: Loop Recorder Insertion;  Surgeon: Evans Lance, MD;  Location: [place] [facility];  Service: Cardiovascular;  Laterality: N/A;    Current Outpatient Prescriptions  Medication Sig Dispense Refill  . albuterol (PROVENTIL, VENTOLIN) (5 MG/ML) 0.5% NEBU Take by nebulization continuous.    . ALPRAZolam (XANAX) 0.5 MG tablet TAKE 1 TABLET BY MOUTH TWICE A DAY AS NEEDED FOR ANXIETY 60 tablet 2  . b complex vitamins tablet Take 1 tablet by mouth daily.    . calcium elemental as carbonate (BARIATRIC TUMS ULTRA) 400 MG tablet Chew 1,000 mg by mouth daily.     . carisoprodol (SOMA) 350 MG tablet Take 350 mg by mouth 3 (three) times daily as needed for muscle spasms.    . clobetasol cream (TEMOVATE) 2.67 % Apply 1 application topically daily as needed (itching). 30 g 1  . digoxin (LANOXIN) 0.25 MG tablet Take 1 tablet (0.25 mg total) by mouth daily. 30 tablet 3  . diltiazem (CARDIZEM CD) 240 MG 24 hr capsule TAKE 1 CAPSULE (240 MG TOTAL) BY MOUTH DAILY AT BEDTIME 30 capsule 6  . diphenhydrAMINE (BENADRYL) 50 MG tablet Take 1 tablet (50 mg total) by mouth every 6 (six) hours as needed for itching. 30 tablet 2  . doxepin (SINEQUAN) 25 MG capsule Take 25 mg by mouth 2 (two) times daily.    . ferrous sulfate 325 (65 FE) MG tablet Take 325 mg by mouth daily.    . Fluticasone-Salmeterol (ADVAIR) 250-50 MCG/DOSE AEPB Inhale 1 puff into the lungs 2 (two) times daily.    . hydrOXYzine (ATARAX/VISTARIL) 25 MG tablet TAKE 1 TABLET BY MOUTH 3 TIMES A DAY FOR ITCHING 30 tablet 1  . ibuprofen (ADVIL,MOTRIN) 200 MG tablet Take 400 mg by mouth at bedtime as needed for mild pain (pain).     . Linaclotide (LINZESS) 145 MCG CAPS capsule Take 145 mcg by mouth daily as needed (Crampy pain).     . Loratadine 10 MG CAPS Take 10 mg by mouth daily.     . methimazole (TAPAZOLE) 10 MG tablet Take  2 tablets (20 mg total) by mouth 2 (two) times daily. 120 tablet 1  . nebivolol (BYSTOLIC) 5 MG tablet Take 5 mg by mouth daily.    [name] omeprazole (PRILOSEC) 20 MG capsule Take 20 mg by mouth daily with supper.     . polyethylene glycol (MIRALAX / GLYCOLAX) packet Take 17 g by mouth daily as needed for mild constipation.    . potassium chloride SA (K-DUR,KLOR-CON) 20 MEQ tablet Take 2 tablets (40 mEq total)  by mouth daily. 30 tablet 1  . PROAIR HFA 108 (90 BASE) MCG/ACT inhaler INHALE 2 PUFFS INTO THE LUNGS 4 TIMES A DAY 8.5 Inhaler 5  . sertraline (ZOLOFT) 100 MG tablet Take 200 mg by mouth daily.   3  . tolnaftate (TINACTIN) 1 % powder Apply 1 application topically 2 (two) times daily. Apply to Groin area    . torsemide (DEMADEX) 20 MG tablet Take 40 mg by mouth daily.    . traZODone (DESYREL) 100 MG tablet Take 50 mg by mouth daily.     [name] witch hazel-glycerin (TUCKS) pad Apply 1 application topically as needed for itching, irritation or hemorrhoids.     [name] zolpidem (AMBIEN) 10 MG tablet Take 10 mg by mouth at bedtime as needed for sleep.    [name] warfarin (COUMADIN) 5 MG tablet Take 5 mg by mouth daily. As directed by coumadin clinic    . [DISCONTINUED] atorvastatin (LIPITOR) 20 MG tablet Take 1 tablet (20 mg total) by mouth daily. 30 tablet 11  . [DISCONTINUED] gabapentin (NEURONTIN) 300 MG capsule Take 1 capsule (300 mg total) by mouth 3 (three) times daily. 90 capsule 5   No current facility-administered medications for this visit.    Allergies:   Pravastatin sodium; Simvastatin; Statins; and Tape   Social History: Social History   Social History  . Marital Status: Divorced    Spouse Name: N/A  . Number of Children: 2  . Years of Education: N/A   Occupational History  . retired/disabled. prev worked in Therapist, sports.    Social History Main Topics  . Smoking status: Former Smoker -- 2.00 packs/day for 30 years    Types: Cigarettes    Quit date: 10/16/2007  . Smokeless tobacco:  Never Used  . Alcohol Use: No  . Drug Use: No  . Sexual Activity: Not on file   Other Topics Concern  . Not on file   Social History Narrative   Lives alone.    Family History: Family History  Problem Relation Age of Onset  . COPD Mother   . Asthma Mother   . Colon polyps Mother   . Allergies Mother   . Hypothyroidism Mother   . Asthma Maternal Grandmother   . Colon cancer Neg Hx     Review of Systems: All other systems reviewed and are otherwise negative except as noted above.   Physical Exam: VS:  BP 118/78 mmHg  Pulse 60  Ht 5\' 10"  (1.778 m)  Wt 299 lb (135.626 kg)  BMI 42.90 kg/m2 , BMI Body mass index is 42.9 kg/(m^2). Wt Readings from Last 3 Encounters:  07/06/15 299 lb (135.626 kg)  06/27/15 304 lb 6 oz (138.064 kg)  06/13/15 297 lb (134.718 kg)    GEN- The patient is obese appearing, alert and oriented x 3 today.   HEENT: normocephalic, atraumatic; sclera clear, conjunctiva pink; hearing intact; oropharynx clear; neck supple Lungs- Clear to ausculation bilaterally, normal work of breathing.  No wheezes, rales, rhonchi Heart- Irregular rate and rhythm GI- soft, non-tender, non-distended, bowel sounds present Extremities- no clubbing, cyanosis, or edema; DP/PT/radial pulses 1+ bilaterally MS- no significant deformity or atrophy Skin- warm and dry, no rash or lesion  Psych- euthymic mood, full affect Neuro- strength and sensation are intact   EKG:  EKG is not ordered today.  Recent Labs: 03/30/2015: B Natriuretic Peptide 506.3* 03/31/2015: Magnesium 1.8 04/01/2015: ALT 55; TSH 0.011* 04/05/2015: Hemoglobin 8.8*; Platelets 381 04/14/2015: BUN 30*; Creatinine, Ser 0.96; Potassium 4.4;  Sodium 138    Other studies Reviewed: Additional studies/ records that were reviewed today include: Dr Olin Pia office notes  Assessment and Plan: 1.  Syncope No recurrence since ILR placed Continue remote monitoring Brady and pause detection extended today 2/2 frequent  nocturnal pauses  2.  Persistent atrial fibrillation Continue Warfarin for CHADS2VASC of at least 4 Ventricular rates relatively well controlled on device interrogation today Will need to pursue sinus once thyroid issues are stable  3.  Chronic systolic heart failure Weight improved since last visit The patient has stable dyspnea on exertion Continue current therapy BMET, Dig level today  4.  Obesity Weight loss encouraged and will be the key to long term improved prognosis  5.  OSA He is unable to tolerate mask ?if it would be of benefit to refer to ENT for evaluation - will defer to Dr [name]   Current medicines are reviewed at length with the patient today.   The patient does not have concerns regarding his medicines.  The following changes were made today:  none  Labs/ tests ordered today include:  Orders Placed This Encounter  Procedures  . Basic Metabolic Panel (BMET)  . Digoxin level     Disposition:   Follow up with Dr [name] in 6 weeks    Signed, Chanetta Marshall, NP 07/06/2015 4:55 PM   [place] [address] [phone] (office) [phone] (fax)

## 2015-07-07 LAB — DIGOXIN LEVEL: Digoxin Level: 0.6 ug/L — ABNORMAL LOW (ref 0.8–2.0)

## 2015-07-08 ENCOUNTER — Telehealth: Payer: Self-pay | Admitting: *Deleted

## 2015-07-11 ENCOUNTER — Telehealth: Payer: Self-pay | Admitting: Internal Medicine

## 2015-07-11 NOTE — Patient Outreach (Signed)
[facility]) Care Management  07/11/2015  ETHRIDGE SOLLENBERGER [name], [name] [id number]   Telephone call received from patient informing me that he was approved for patient assistance through [place] access. He stated the letter informed him that he needed a prescription from the doctor before they could send his medication out. He is going to go by Dr. Gwynn Burly office tomorrow and request they fax a prescription to the company. Patient also stated he received a packet from social security and ask if I could assistance him with it. I told him to bring it by the office tomorrow and we would look at it together.  Damita L. Rhodie, [place] Care Management Assistant

## 2015-07-11 NOTE — Telephone Encounter (Signed)
Pt states [facility] is needing an original copy of the prescription Fluticasone-Salmeterol (ADVAIR) 250-50 MCG/DOSE AEPB [[id number]]  He is aware Dr. [name] is not in today and he would like to pick it up.

## 2015-07-12 MED ORDER — FLUTICASONE-SALMETEROL 250-50 MCG/DOSE IN AEPB: 1.0000 | INHALATION_SPRAY | Freq: Two times a day (BID) | RESPIRATORY_TRACT | Status: DC

## 2015-07-12 NOTE — Telephone Encounter (Signed)
Rx faxed per pt request  ?

## 2015-07-13 NOTE — Patient Outreach (Signed)
[facility]) Care Management  07/12/2015  [name] [birth year] [id number]   Telephone call received from patient regarding our meeting that was scheduled for today. Patient states he believes he will be able to fill out the paperwork from social security he received and that he spoke to the nurse at Dr. Gwynn Burly office and she will be faxing over a prescription to [place] access to get his medication sent to him. Since patient has been approved for assistance, I am removing myself from the care team as there is no other needs from me at this time.  Janella Rogala L. Calil Amor, [place] Care Management Assistant

## 2015-07-14 DIAGNOSIS — G4733 Obstructive sleep apnea (adult) (pediatric): Secondary | ICD-10-CM | POA: Diagnosis not present

## 2015-07-15 ENCOUNTER — Other Ambulatory Visit: Payer: Self-pay

## 2015-07-15 NOTE — Patient Outreach (Signed)
Closing case to pharmacy due to patient being approved for his medication from [place].   Deanne Coffer, PharmD, [place] [phone]

## 2015-07-15 NOTE — Patient Outreach (Signed)
[facility]) Care Management  07/15/2015  [name] 1956/09/21 [id number]   Notification received from [facility], PharmD to close patient due to successfully achieving goals with [facility].  Damita L. Rhodie, [place] Care Management Assistant

## 2015-07-19 ENCOUNTER — Ambulatory Visit (INDEPENDENT_AMBULATORY_CARE_PROVIDER_SITE_OTHER): Payer: Commercial Managed Care - HMO | Admitting: General Practice

## 2015-07-19 DIAGNOSIS — Z5181 Encounter for therapeutic drug level monitoring: Secondary | ICD-10-CM | POA: Diagnosis not present

## 2015-07-19 DIAGNOSIS — I4891 Unspecified atrial fibrillation: Secondary | ICD-10-CM

## 2015-07-19 LAB — POCT INR: INR: 1.5

## 2015-07-19 NOTE — Progress Notes (Signed)
Pre visit review using our clinic review tool, if applicable. No additional management support is needed unless otherwise documented below in the visit note. 

## 2015-07-20 NOTE — Progress Notes (Signed)
I have reviewed and agree with the plan. 

## 2015-07-25 ENCOUNTER — Other Ambulatory Visit: Payer: Commercial Managed Care - HMO

## 2015-07-28 ENCOUNTER — Ambulatory Visit: Payer: Commercial Managed Care - HMO | Admitting: Endocrinology

## 2015-08-02 ENCOUNTER — Telehealth: Payer: Self-pay | Admitting: *Deleted

## 2015-08-02 LAB — CUP PACEART REMOTE DEVICE CHECK: Date Time Interrogation Session: 20160920113553

## 2015-08-02 NOTE — Telephone Encounter (Signed)
Called patient back with recommendations from Dr. Marlou Porch.  Patient reports that he already took his nebivolol this morning, but that he will not take his digoxin tonight.  Advised patient that per Dr. Marlou Porch, he should still take his diltiazem tonight.  Advised patient to avoid driving and to call [phone] if his symptoms worsen overnight.  Patient verbalizes understanding of all instructions and denies additional questions or concerns at this time.  Will speak with Dr. Rayann Heman regarding additional recommendations tomorrow and call patient back.

## 2015-08-02 NOTE — Telephone Encounter (Signed)
Called patient regarding 5 sec pause on LINQ transmission from 08/01/15.  Patient states that his "heart rate drop" and states that he felt dizzy at the time of the episode.  Patient states that he feels "better today".  Told patient that I will speak with MD for recommendations, but that he will likely have to hold some of his rate control medications.  Spoke with Dr. Marlou Porch, DOD, and he recommended that patient hold his nebivolol 5mg  and digoxin 0.25mg  (added in August 2016 per Dr. Olin Pia office visit note from 06/02/15) but continue to take his diltiazem 240mg  until an EP MD is back in the office tomorrow and additional recommendations can be made.  Will call patient back with recommendations.

## 2015-08-02 NOTE — Progress Notes (Signed)
Carelink summary report received. Battery status OK. Normal device function. No new symptom episodes, tachy episodes, or brady. 105 pauses---all appropriate, mostly nocturnal. 90.3% AF + warfarin. Monthly summary reports and ROV w/ SK 09/05/15.

## 2015-08-03 ENCOUNTER — Other Ambulatory Visit: Payer: Self-pay | Admitting: Internal Medicine

## 2015-08-03 NOTE — Telephone Encounter (Signed)
Spoke with patient regarding recommendations from Dr. Rayann Heman.  Per Dr. Rayann Heman, patient should continue to hold/stop his digoxin and his nebivolol until the episode can be reviewed by Dr. [name].  Patient voices understanding of instructions and denies additional questions or concerns at this time.  Will call patient with Dr. Olin Pia recommendations when he is in the office next week.  Patient aware to call with worsening symptoms, questions, or concerns.

## 2015-08-04 ENCOUNTER — Ambulatory Visit (INDEPENDENT_AMBULATORY_CARE_PROVIDER_SITE_OTHER): Payer: Commercial Managed Care - HMO | Admitting: *Deleted

## 2015-08-04 DIAGNOSIS — R55 Syncope and collapse: Secondary | ICD-10-CM | POA: Diagnosis not present

## 2015-08-04 NOTE — Telephone Encounter (Signed)
Done hardcopy to [name] 

## 2015-08-04 NOTE — Telephone Encounter (Signed)
Faxed to [facility]

## 2015-08-05 ENCOUNTER — Encounter: Payer: Self-pay | Admitting: Internal Medicine

## 2015-08-05 ENCOUNTER — Other Ambulatory Visit: Payer: Self-pay | Admitting: Internal Medicine

## 2015-08-05 NOTE — Progress Notes (Signed)
LOOP RECOREDER

## 2015-08-09 ENCOUNTER — Ambulatory Visit (INDEPENDENT_AMBULATORY_CARE_PROVIDER_SITE_OTHER): Payer: Commercial Managed Care - HMO | Admitting: General Practice

## 2015-08-09 DIAGNOSIS — I4891 Unspecified atrial fibrillation: Secondary | ICD-10-CM

## 2015-08-09 DIAGNOSIS — Z5181 Encounter for therapeutic drug level monitoring: Secondary | ICD-10-CM

## 2015-08-09 LAB — POCT INR: INR: 1.3

## 2015-08-09 NOTE — Progress Notes (Signed)
I have reviewed and agree with the plan. 

## 2015-08-09 NOTE — Progress Notes (Signed)
Pre visit review using our clinic review tool, if applicable. No additional management support is needed unless otherwise documented below in the visit note. 

## 2015-08-09 NOTE — Telephone Encounter (Signed)
Per Dr. [name], schedule patient for 8:30am appointment in the next few weeks.  Spoke with patient, who states that he is on his way to a coumadin clinic appointment.  He states he will call me back after his appointment to schedule an appointment with Dr. [name].

## 2015-08-09 NOTE — Telephone Encounter (Signed)
Called patient back to see if he can come in for an appointment with Dr. [name] tomorrow morning, 08/10/15, at 8:30am.  Patient agreeable to this appointment.  He denies any additional questions or concerns at this time.

## 2015-08-10 ENCOUNTER — Other Ambulatory Visit: Payer: Self-pay | Admitting: Internal Medicine

## 2015-08-10 ENCOUNTER — Ambulatory Visit (INDEPENDENT_AMBULATORY_CARE_PROVIDER_SITE_OTHER): Payer: Commercial Managed Care - HMO | Admitting: Internal Medicine

## 2015-08-10 ENCOUNTER — Encounter: Payer: Self-pay | Admitting: Internal Medicine

## 2015-08-10 VITALS — BP 96/62 | HR 65

## 2015-08-10 DIAGNOSIS — I429 Cardiomyopathy, unspecified: Secondary | ICD-10-CM

## 2015-08-10 LAB — CUP PACEART INCLINIC DEVICE CHECK: MDC IDC SESS DTM: 20161026125151

## 2015-08-10 MED ORDER — DILTIAZEM HCL ER COATED BEADS 120 MG PO CP24: 120.0000 mg | ORAL_CAPSULE | Freq: Every day | ORAL | Status: DC

## 2015-08-10 NOTE — Progress Notes (Signed)
Patient Care Team: [name], MD as PCP - Roy, RD as Dietitian   HPI  [name] is a 59 y.o. male   seen in followup for atrial fibrillation for which he is treated with amiodarone.  There was some concern regarding dyspnea so he underwent pulmonary function testing by Dr. Gwenette Greet   Echocardiogram 7/13 demonstrated EF 20-25% with severe biatrial enlargement. Repeat echo 8/14 and demonstrated an intercurrent normalization of LV function with biatrial enlargement and mild RV dilatation    Myoview 2012 demonstrated an ejection fraction of 44% with a prior inferior wall infarct Myoview 2015 demonstrated an ejection fraction 57% with an inferior wall perfusion defect question bowel  DATE TEST     /2012 myoview   EF44 Prior IMI perfsion defect  /2015 myoview   EF57% Perfusion defect--MI vs bowel  2016 myoview 35-40 NO perfusion defect  6/16 Echo 40-45 Severe BAE   Antiarrhythmics Date  dofetilide 2013  amidoarone 2016        He had right bundle branch block  6/16 admitted to hospital following episodes of syncope and underwent ILR insertion   he is seen today becu       Past Medical History  Diagnosis Date  . DYSKINESIA, ESOPHAGUS   . FIBROMYALGIA   . HYPERLIPIDEMIA     a. Intolerant to statins.  . INSOMNIA     takes Ambien nightly  . Rash and other nonspecific skin eruption 04/12/2009    no cause found saw dermatologists x 2 and allergist  . Paroxysmal atrial fibrillation     a. CHA2DS2VASc = 3--> takes Coumadin;  b. 03/15/2015 Successful TEE/DCCV;  c. 03/2015 recurrent afib, Amio d/c'd in setting of hyperthyroidism.  . Cardiomyopathy     a. EF 25% TEE July 2013; b. EF normalized 2015;  c. 03/2015 Echo: EF 40-45%, difrf HK, PASP 38 mmHg, Mild MR, sev LAE/RAE.  [name] Myocardial infarction     a. 2012 Myoview notable for prior infarct;  b. 03/2015 Lexiscan CL: EF 37%, diff HK, small area of inferior infarct from apex to base-->Med Rx.  .  ANXIETY     takes Xanax nightly  . ASTHMA     Albuterol prn and Advair daily;also takes Prednisone daily  . GERD (gastroesophageal reflux disease)        . Type II diabetes mellitus        . Essential hypertension        . COPD (chronic obstructive pulmonary disease)   . O2 dependent     uses oxygen 2 and 1/2 liters per Dendron at hs  . SLEEP APNEA, OBSTRUCTIVE     a. doesn't use CPAP  . Peripheral neuropathy   . Arthritis   . Chronic constipation     takes OTC stool softener  . Diverticulitis   . Glaucoma   . DEPRESSION     takes Zoloft and Doxepin daily  . Anemia     supposed to be taking Vit B but doesn't  . Syncope     a. 03/2015 s/p MDT LINQ.    Past Surgical History  Procedure Laterality Date  . Tonsillectomy    . Acne cyst removal      2 on back   . Cardioversion  04/18/2012    Procedure: CARDIOVERSION;  Surgeon: Fay Records, MD;  Location: [place];  Service: Cardiovascular;  Laterality: N/A;  . Tee without cardioversion  04/25/2012    Procedure:  TRANSESOPHAGEAL ECHOCARDIOGRAM (TEE);  Surgeon: Thayer Headings, MD;  Location: [place];  Service: Cardiovascular;  Laterality: N/A;  . Cardioversion  04/25/2012    Procedure: CARDIOVERSION;  Surgeon: Thayer Headings, MD;  Location: [facility] ENDOSCOPY;  Service: Cardiovascular;  Laterality: N/A;  . Cardioversion  04/25/2012    Procedure: CARDIOVERSION;  Surgeon: Fay Records, MD;  Location: [place];  Service: Cardiovascular;  Laterality: N/A;  . Cardioversion  05/09/2012    Procedure: CARDIOVERSION;  Surgeon: Sherren Mocha, MD;  Location: [place];  Service: Cardiovascular;  Laterality: N/A;  changed from crenshaw to cooper by trish/leone-endo  . Colonoscopy    . Esophagogastroduodenoscopy    . Total knee arthroplasty Right 06/15/2014    Procedure: TOTAL KNEE ARTHROPLASTY;  Surgeon: Renette Butters, MD;  Location: [place];  Service: Orthopedics;  Laterality: Right;  . Colonoscopy with propofol N/A 10/21/2014    Procedure: COLONOSCOPY WITH  PROPOFOL;  Surgeon: Ladene Artist, MD;  Location: [facility] ENDOSCOPY;  Service: Endoscopy;  Laterality: N/A;  . Tee without cardioversion N/A 03/15/2015    Procedure: TRANSESOPHAGEAL ECHOCARDIOGRAM (TEE);  Surgeon: Thayer Headings, MD;  Location: [place];  Service: Cardiovascular;  Laterality: N/A;  . Cardioversion N/A 03/15/2015    Procedure: CARDIOVERSION;  Surgeon: Thayer Headings, MD;  Location: [place];  Service: Cardiovascular;  Laterality: N/A;  . Ep implantable device N/A 04/06/2015    Procedure: Loop Recorder Insertion;  Surgeon: Evans Lance, MD;  Location: [place] [facility];  Service: Cardiovascular;  Laterality: N/A;    Current Outpatient Prescriptions  Medication Sig Dispense Refill  . albuterol (PROVENTIL HFA;VENTOLIN HFA) 108 (90 BASE) MCG/ACT inhaler Inhale 2 puffs into the lungs 4 times a day as needed    . ALPRAZolam (XANAX) 0.5 MG tablet TAKE 1 TABLET BY MOUTH TWICE A DAY AS NEEDED FOR ANXIETY 60 tablet 2  . carisoprodol (SOMA) 350 MG tablet Take 350 mg by mouth 3 (three) times daily as needed for muscle spasms.    . clobetasol cream (TEMOVATE) 5.00 % Apply 1 application topically daily as needed (itching). 30 g 1  . digoxin (LANOXIN) 0.25 MG tablet Take 1 tablet (0.25 mg total) by mouth daily. 30 tablet 3  . diltiazem (CARDIZEM CD) 240 MG 24 hr capsule TAKE 1 CAPSULE (240 MG TOTAL) BY MOUTH DAILY AT BEDTIME 30 capsule 6  . doxepin (SINEQUAN) 25 MG capsule Take 25 mg by mouth 2 (two) times daily.    . ferrous sulfate 325 (65 FE) MG tablet Take 325 mg by mouth daily.    . Fluticasone-Salmeterol (ADVAIR) 250-50 MCG/DOSE AEPB Inhale 1 puff into the lungs 2 (two) times daily. 60 each 11  . hydrOXYzine (ATARAX/VISTARIL) 25 MG tablet Take 25 mg by mouth 3 (three) times daily as needed.    [name] ibuprofen (ADVIL,MOTRIN) 200 MG tablet Take 400 mg by mouth at bedtime as needed for mild pain (pain).     . Linaclotide (LINZESS) 145 MCG CAPS capsule Take 145 mcg by mouth daily as  needed (Crampy pain).     . Loratadine 10 MG CAPS Take 10 mg by mouth daily.     . methimazole (TAPAZOLE) 10 MG tablet Take 2 tablets (20 mg total) by mouth 2 (two) times daily. 120 tablet 1  . nebivolol (BYSTOLIC) 5 MG tablet Take 5 mg by mouth daily.    [name] omeprazole (PRILOSEC) 20 MG capsule Take 20 mg by mouth daily with supper.     . polyethylene glycol (MIRALAX /  GLYCOLAX) packet Take 17 g by mouth daily as needed for mild constipation.    . potassium chloride SA (K-DUR,KLOR-CON) 20 MEQ tablet Take 2 tablets (40 mEq total) by mouth daily. 30 tablet 1  . sertraline (ZOLOFT) 100 MG tablet Take two tablets (200 mg) by mouth once daily  3  . tolnaftate (TINACTIN) 1 % powder Apply 1 application topically 2 (two) times daily. Apply to Groin area    . torsemide (DEMADEX) 20 MG tablet Take two tablets (40 mg) by mouth once daily    . traZODone (DESYREL) 100 MG tablet Take 1/2 tablet (50 mg) by mouth once daily    . warfarin (COUMADIN) 5 MG tablet Take 5 mg by mouth daily. As directed by coumadin clinic    . witch hazel-glycerin (TUCKS) pad Apply 1 application topically as needed for itching, irritation or hemorrhoids.     [name] zolpidem (AMBIEN) 10 MG tablet Take 10 mg by mouth at bedtime as needed for sleep.    . [DISCONTINUED] atorvastatin (LIPITOR) 20 MG tablet Take 1 tablet (20 mg total) by mouth daily. 30 tablet 11  . [DISCONTINUED] gabapentin (NEURONTIN) 300 MG capsule Take 1 capsule (300 mg total) by mouth 3 (three) times daily. 90 capsule 5   No current facility-administered medications for this visit.    Allergies  Allergen Reactions  . Pravastatin Sodium Other (See Comments)    myalgia  . Simvastatin Other (See Comments)    severe leg cramps  . Statins Other (See Comments)    myalgia  . Tape Other (See Comments)    Skin Tears Use Paper Tape Only    Review of Systems negative except from HPI and PMH  Physical Exam BP 96/62 mmHg  Pulse 65 Well developed andmorbidly obese in no  acute distress HENT normal E scleral and icterus clear JVP not discernible Neck Supple Clear to ausculation Irregularly  irregular rate and rhythm, no murmurs gallops or rub Soft with active bowel sounds No clubbing cyanosis  2+Edema Alert and oriented, grossly normal motor and sensory function Affect flat Skin Warm and Dry     Assessment and  Plan  Ischemic heart disease with prior MI by Myoview  [id number]  Cardiomyopathy-ischemic/nonischemic- resolved  Atrial fibrillation persistent  Hyperthyroidism >>euthyroid   He is euthyroid and with this his HR has improved (averge now in 60s) peaks still fast  Will decrease cardiazem 240>>120 and continue bisoprolol and hold dig  Will reassess LV funciton in about 4 weeks  Pauses were nocturnal  He has untreatable sleep apnea

## 2015-08-10 NOTE — Patient Instructions (Signed)
Medication Instructions: 1) Continue to hold digoxin 2) Decrease diltiazem (cardiazem) to 120 mg one capsule by mouth once daily 3) Resume nebivolol (bystolic) at 5 mg one tablet by mouth once daily  Labwork: - none  Procedures/Testing: - Your physician has requested that you have an echocardiogram- the week prior to your appointment in November with Dr. [name].. Echocardiography is a painless test that uses sound waves to create images of your heart. It provides your doctor with information about the size and shape of your heart and how well your heart's chambers and valves are working. This procedure takes approximately one hour. There are no restrictions for this procedure.  Follow-Up: - Your physician recommends that you follow-up appointment as scheduled: Monday 09/05/15 at 10:30 am  Any Additional Special Instructions Will Be Listed Below (If Applicable). - Decrease oral fluid intake

## 2015-08-11 ENCOUNTER — Other Ambulatory Visit: Payer: Self-pay | Admitting: Internal Medicine

## 2015-08-13 ENCOUNTER — Other Ambulatory Visit: Payer: Self-pay | Admitting: Internal Medicine

## 2015-08-13 DIAGNOSIS — G4733 Obstructive sleep apnea (adult) (pediatric): Secondary | ICD-10-CM | POA: Diagnosis not present

## 2015-08-16 ENCOUNTER — Ambulatory Visit (INDEPENDENT_AMBULATORY_CARE_PROVIDER_SITE_OTHER): Payer: Commercial Managed Care - HMO | Admitting: General Practice

## 2015-08-16 ENCOUNTER — Other Ambulatory Visit: Payer: Self-pay | Admitting: Internal Medicine

## 2015-08-16 DIAGNOSIS — Z5181 Encounter for therapeutic drug level monitoring: Secondary | ICD-10-CM | POA: Diagnosis not present

## 2015-08-16 LAB — POCT INR: INR: 1.5

## 2015-08-16 NOTE — Telephone Encounter (Signed)
Rx faxed to pharmacy  

## 2015-08-16 NOTE — Progress Notes (Signed)
I have reviewed and agree with the plan. 

## 2015-08-16 NOTE — Telephone Encounter (Signed)
Done hardcopy to Dahlia  

## 2015-08-16 NOTE — Progress Notes (Signed)
Pre visit review using our clinic review tool, if applicable. No additional management support is needed unless otherwise documented below in the visit note. 

## 2015-08-22 ENCOUNTER — Encounter: Payer: Self-pay | Admitting: Internal Medicine

## 2015-08-23 LAB — CUP PACEART REMOTE DEVICE CHECK: MDC IDC SESS DTM: 20161020120532

## 2015-08-23 NOTE — Progress Notes (Signed)
Carelink summary report received. Battery status OK. Normal device function. No new symptom, tachy, or brady episodes. 39 pause episodes--most nocturnal and asymptomatic. One symptomatic pause, duration 5 sec, patient reports feeling dizzy. 328 AF episodes (burden 83.8%), +warfarin, avg V rate controlled. Monthly summary reports and ROV with SK on 09/05/15 at 10:30am.

## 2015-08-24 ENCOUNTER — Telehealth: Payer: Self-pay | Admitting: Internal Medicine

## 2015-08-24 MED ORDER — FLUTICASONE-SALMETEROL 250-50 MCG/DOSE IN AEPB: 1.0000 | INHALATION_SPRAY | Freq: Two times a day (BID) | RESPIRATORY_TRACT | Status: DC

## 2015-08-24 NOTE — Telephone Encounter (Signed)
Patient states he has been approved with the manufacturer for prescription Fluticasone-Salmeterol (ADVAIR) 250-50 MCG/DOSE AEPB [[id number]]  He says you need to fax the prescription to [facility] at [phone]

## 2015-08-24 NOTE — Telephone Encounter (Signed)
Rx faxed to [facility] as requested

## 2015-08-26 ENCOUNTER — Other Ambulatory Visit: Payer: Self-pay | Admitting: Internal Medicine

## 2015-08-26 ENCOUNTER — Encounter: Payer: Self-pay | Admitting: Internal Medicine

## 2015-08-29 ENCOUNTER — Other Ambulatory Visit (HOSPITAL_COMMUNITY): Payer: Commercial Managed Care - HMO | Admitting: *Deleted

## 2015-08-29 ENCOUNTER — Other Ambulatory Visit: Payer: Self-pay

## 2015-08-29 ENCOUNTER — Ambulatory Visit (HOSPITAL_COMMUNITY): Payer: Commercial Managed Care - HMO | Attending: Cardiovascular Disease

## 2015-08-29 DIAGNOSIS — I429 Cardiomyopathy, unspecified: Secondary | ICD-10-CM

## 2015-08-29 DIAGNOSIS — E785 Hyperlipidemia, unspecified: Secondary | ICD-10-CM | POA: Insufficient documentation

## 2015-08-29 DIAGNOSIS — I10 Essential (primary) hypertension: Secondary | ICD-10-CM | POA: Insufficient documentation

## 2015-08-29 DIAGNOSIS — I517 Cardiomegaly: Secondary | ICD-10-CM | POA: Diagnosis not present

## 2015-08-29 DIAGNOSIS — E119 Type 2 diabetes mellitus without complications: Secondary | ICD-10-CM | POA: Diagnosis not present

## 2015-08-29 DIAGNOSIS — Z87891 Personal history of nicotine dependence: Secondary | ICD-10-CM | POA: Insufficient documentation

## 2015-08-29 DIAGNOSIS — I4510 Unspecified right bundle-branch block: Secondary | ICD-10-CM | POA: Insufficient documentation

## 2015-08-29 DIAGNOSIS — Z6841 Body Mass Index (BMI) 40.0 and over, adult: Secondary | ICD-10-CM | POA: Diagnosis not present

## 2015-08-29 MED ORDER — PERFLUTREN LIPID MICROSPHERE
1.0000 mL | INTRAVENOUS | Status: AC | PRN
  Administered 2015-08-29: 1 mL via INTRAVENOUS

## 2015-08-30 ENCOUNTER — Ambulatory Visit (INDEPENDENT_AMBULATORY_CARE_PROVIDER_SITE_OTHER): Payer: Commercial Managed Care - HMO | Admitting: General Practice

## 2015-08-30 ENCOUNTER — Encounter: Payer: Self-pay | Admitting: Internal Medicine

## 2015-08-30 DIAGNOSIS — I4891 Unspecified atrial fibrillation: Secondary | ICD-10-CM | POA: Diagnosis not present

## 2015-08-30 DIAGNOSIS — Z5181 Encounter for therapeutic drug level monitoring: Secondary | ICD-10-CM | POA: Diagnosis not present

## 2015-08-30 LAB — POCT INR: INR: 4

## 2015-08-30 NOTE — Progress Notes (Signed)
I have reviewed and agree with the plan. 

## 2015-08-30 NOTE — Progress Notes (Signed)
Pre visit review using our clinic review tool, if applicable. No additional management support is needed unless otherwise documented below in the visit note. 

## 2015-08-31 ENCOUNTER — Encounter (HOSPITAL_COMMUNITY): Payer: Self-pay | Admitting: *Deleted

## 2015-08-31 ENCOUNTER — Other Ambulatory Visit (HOSPITAL_COMMUNITY): Payer: Self-pay

## 2015-08-31 ENCOUNTER — Other Ambulatory Visit: Payer: Self-pay

## 2015-08-31 ENCOUNTER — Observation Stay (HOSPITAL_COMMUNITY)
Admission: EM | Admit: 2015-08-31 | Discharge: 2015-09-01 | Disposition: A | Discharge status: Home / Self Care | Payer: Commercial Managed Care - HMO | Attending: Internal Medicine | Admitting: Internal Medicine

## 2015-08-31 ENCOUNTER — Telehealth: Payer: Self-pay | Admitting: *Deleted

## 2015-08-31 ENCOUNTER — Emergency Department (HOSPITAL_COMMUNITY): Payer: Commercial Managed Care - HMO

## 2015-08-31 DIAGNOSIS — K219 Gastro-esophageal reflux disease without esophagitis: Secondary | ICD-10-CM | POA: Diagnosis present

## 2015-08-31 DIAGNOSIS — M797 Fibromyalgia: Secondary | ICD-10-CM | POA: Diagnosis not present

## 2015-08-31 DIAGNOSIS — F329 Major depressive disorder, single episode, unspecified: Secondary | ICD-10-CM | POA: Diagnosis not present

## 2015-08-31 DIAGNOSIS — R002 Palpitations: Secondary | ICD-10-CM | POA: Diagnosis not present

## 2015-08-31 DIAGNOSIS — I429 Cardiomyopathy, unspecified: Secondary | ICD-10-CM | POA: Insufficient documentation

## 2015-08-31 DIAGNOSIS — R079 Chest pain, unspecified: Secondary | ICD-10-CM | POA: Diagnosis present

## 2015-08-31 DIAGNOSIS — I4510 Unspecified right bundle-branch block: Secondary | ICD-10-CM | POA: Insufficient documentation

## 2015-08-31 DIAGNOSIS — G4733 Obstructive sleep apnea (adult) (pediatric): Secondary | ICD-10-CM | POA: Diagnosis present

## 2015-08-31 DIAGNOSIS — D649 Anemia, unspecified: Secondary | ICD-10-CM | POA: Diagnosis not present

## 2015-08-31 DIAGNOSIS — J45909 Unspecified asthma, uncomplicated: Secondary | ICD-10-CM | POA: Diagnosis not present

## 2015-08-31 DIAGNOSIS — Z7901 Long term (current) use of anticoagulants: Secondary | ICD-10-CM | POA: Insufficient documentation

## 2015-08-31 DIAGNOSIS — E785 Hyperlipidemia, unspecified: Secondary | ICD-10-CM | POA: Diagnosis not present

## 2015-08-31 DIAGNOSIS — J449 Chronic obstructive pulmonary disease, unspecified: Secondary | ICD-10-CM | POA: Insufficient documentation

## 2015-08-31 DIAGNOSIS — I480 Paroxysmal atrial fibrillation: Secondary | ICD-10-CM | POA: Insufficient documentation

## 2015-08-31 DIAGNOSIS — Z9981 Dependence on supplemental oxygen: Secondary | ICD-10-CM | POA: Insufficient documentation

## 2015-08-31 DIAGNOSIS — I110 Hypertensive heart disease with heart failure: Secondary | ICD-10-CM | POA: Diagnosis not present

## 2015-08-31 DIAGNOSIS — I2510 Atherosclerotic heart disease of native coronary artery without angina pectoris: Secondary | ICD-10-CM | POA: Insufficient documentation

## 2015-08-31 DIAGNOSIS — E119 Type 2 diabetes mellitus without complications: Secondary | ICD-10-CM

## 2015-08-31 DIAGNOSIS — F419 Anxiety disorder, unspecified: Secondary | ICD-10-CM | POA: Diagnosis not present

## 2015-08-31 DIAGNOSIS — M542 Cervicalgia: Secondary | ICD-10-CM

## 2015-08-31 DIAGNOSIS — J811 Chronic pulmonary edema: Secondary | ICD-10-CM | POA: Diagnosis not present

## 2015-08-31 DIAGNOSIS — E1142 Type 2 diabetes mellitus with diabetic polyneuropathy: Secondary | ICD-10-CM | POA: Diagnosis not present

## 2015-08-31 DIAGNOSIS — R600 Localized edema: Secondary | ICD-10-CM | POA: Diagnosis not present

## 2015-08-31 DIAGNOSIS — I5023 Acute on chronic systolic (congestive) heart failure: Secondary | ICD-10-CM | POA: Diagnosis not present

## 2015-08-31 DIAGNOSIS — R0789 Other chest pain: Secondary | ICD-10-CM | POA: Diagnosis not present

## 2015-08-31 DIAGNOSIS — R55 Syncope and collapse: Secondary | ICD-10-CM | POA: Diagnosis not present

## 2015-08-31 DIAGNOSIS — Z6841 Body Mass Index (BMI) 40.0 and over, adult: Secondary | ICD-10-CM | POA: Insufficient documentation

## 2015-08-31 DIAGNOSIS — E039 Hypothyroidism, unspecified: Secondary | ICD-10-CM | POA: Diagnosis not present

## 2015-08-31 DIAGNOSIS — Z91048 Other nonmedicinal substance allergy status: Secondary | ICD-10-CM | POA: Insufficient documentation

## 2015-08-31 DIAGNOSIS — M549 Dorsalgia, unspecified: Secondary | ICD-10-CM | POA: Insufficient documentation

## 2015-08-31 DIAGNOSIS — E0590 Thyrotoxicosis, unspecified without thyrotoxic crisis or storm: Secondary | ICD-10-CM | POA: Insufficient documentation

## 2015-08-31 DIAGNOSIS — I481 Persistent atrial fibrillation: Secondary | ICD-10-CM | POA: Insufficient documentation

## 2015-08-31 DIAGNOSIS — I252 Old myocardial infarction: Secondary | ICD-10-CM | POA: Insufficient documentation

## 2015-08-31 DIAGNOSIS — Z888 Allergy status to other drugs, medicaments and biological substances status: Secondary | ICD-10-CM | POA: Insufficient documentation

## 2015-08-31 DIAGNOSIS — F32A Depression, unspecified: Secondary | ICD-10-CM | POA: Diagnosis present

## 2015-08-31 DIAGNOSIS — M1990 Unspecified osteoarthritis, unspecified site: Secondary | ICD-10-CM | POA: Diagnosis not present

## 2015-08-31 DIAGNOSIS — I4819 Other persistent atrial fibrillation: Secondary | ICD-10-CM | POA: Diagnosis present

## 2015-08-31 DIAGNOSIS — I10 Essential (primary) hypertension: Secondary | ICD-10-CM | POA: Diagnosis present

## 2015-08-31 DIAGNOSIS — Z87891 Personal history of nicotine dependence: Secondary | ICD-10-CM | POA: Diagnosis not present

## 2015-08-31 DIAGNOSIS — Z79899 Other long term (current) drug therapy: Secondary | ICD-10-CM | POA: Diagnosis not present

## 2015-08-31 LAB — COMPREHENSIVE METABOLIC PANEL
ALBUMIN: 3.9 g/dL (ref 3.5–5.0)
ALT: 25 U/L (ref 17–63)
AST: 34 U/L (ref 15–41)
Alkaline Phosphatase: 90 U/L (ref 38–126)
Anion gap: 11 (ref 5–15)
BUN: 18 mg/dL (ref 6–20)
CHLORIDE: 102 mmol/L (ref 101–111)
CO2: 25 mmol/L (ref 22–32)
Calcium: 9 mg/dL (ref 8.9–10.3)
Creatinine, Ser: 1.45 mg/dL — ABNORMAL HIGH (ref 0.61–1.24)
GFR calc Af Amer: 59 mL/min — ABNORMAL LOW (ref >60)
GFR, EST NON AFRICAN AMERICAN: 51 mL/min — AB (ref >60)
Glucose, Bld: 92 mg/dL (ref 65–99)
POTASSIUM: 3.1 mmol/L — AB (ref 3.5–5.1)
SODIUM: 138 mmol/L (ref 135–145)
Total Bilirubin: 0.6 mg/dL (ref 0.3–1.2)
Total Protein: 7 g/dL (ref 6.5–8.1)

## 2015-08-31 LAB — CBC WITH DIFFERENTIAL/PLATELET
BASOS ABS: 0 10*3/uL (ref 0.0–0.1)
BASOS PCT: 0 %
EOS ABS: 0 10*3/uL (ref 0.0–0.7)
EOS PCT: 0 %
HCT: 38.1 % — ABNORMAL LOW (ref 39.0–52.0)
Hemoglobin: 12.3 g/dL — ABNORMAL LOW (ref 13.0–17.0)
Lymphocytes Relative: 19 %
Lymphs Abs: 2 10*3/uL (ref 0.7–4.0)
MCH: 26.1 pg (ref 26.0–34.0)
MCHC: 32.3 g/dL (ref 30.0–36.0)
MCV: 80.9 fL (ref 78.0–100.0)
MONO ABS: 1 10*3/uL (ref 0.1–1.0)
Monocytes Relative: 9 %
Neutro Abs: 7.7 10*3/uL (ref 1.7–7.7)
Neutrophils Relative %: 72 %
PLATELETS: 280 10*3/uL (ref 150–400)
RBC: 4.71 MIL/uL (ref 4.22–5.81)
RDW: 19.2 % — AB (ref 11.5–15.5)
WBC: 10.7 10*3/uL — AB (ref 4.0–10.5)

## 2015-08-31 LAB — TROPONIN I

## 2015-08-31 LAB — PROTIME-INR
INR: 2.21 — ABNORMAL HIGH (ref 0.00–1.49)
Prothrombin Time: 24.3 seconds — ABNORMAL HIGH (ref 11.6–15.2)

## 2015-08-31 MED ORDER — ONDANSETRON 4 MG PO TBDP
4.0000 mg | ORAL_TABLET | Freq: Once | ORAL | Status: AC
  Administered 2015-08-31: 4 mg via ORAL

## 2015-08-31 MED ORDER — ONDANSETRON 4 MG PO TBDP
ORAL_TABLET | ORAL | Status: AC
  Filled 2015-08-31: qty 1

## 2015-08-31 NOTE — ED Notes (Signed)
Patient presents stating he was called by Raquel Sarna at [facility] to come to the ED since they noticed a strip from his recorder and he was having a racing heart

## 2015-08-31 NOTE — ED Notes (Signed)
Patient now c/o nausea

## 2015-08-31 NOTE — Telephone Encounter (Signed)
Called patient regarding ~5 sec pause on LINQ transmission from 08/29/15.  Patient reports that he was asleep and asymptomatic at the time of the episode.  He does state that he has had intermittent left chest pressure "for the last couple of weeks".  He rates this pressure a 3-4/10 and is unsure if any interventions make it better or worse.  He states that "it's kind of like that angina".  He states that he was "just going to ask about it at my appointment with Dr. [name] next week".  Discussed symptoms with Dr. [name], who recommended that patient proceed to the ED.  Called patient back to relay Dr. 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  He again reiterates that "there is no way I'm going before that".  Emphasized the importance of following these recommendations.  Encouraged patient to have someone call 911 if his symptoms worsen during church tonight.  Patient verbalizes understanding and thanked me for "caring" about him.  He denies any additional questions or concerns at this time.

## 2015-08-31 NOTE — ED Provider Notes (Signed)
CSN: [id number]     Arrival date & time 08/31/15  2145 History   By signing my name below, I, [name], attest that this documentation has been prepared under the direction and in the presence of Everlene Balls, MD. Electronically Signed: [name], ED Scribe. 08/31/2015. 11:24 PM.    Chief Complaint  Patient presents with  . Palpitations   The history is provided by the patient. No language interpreter was used.    HPI Comments: [name] is a 59 y.o. male with h/o Afib, HLD, Afib, MI, GERD, COPD who presents to the Emergency Department, referred to the ED by [place], complaining of an intermittent sensation of his heart racing and chest pressure onset 5 months ago. Pt states associated nausea, right shoulder pain. He notes that the shoulder pain is not related to the episodes of chest pressure. Pt denies any current pressure sensation. He reports that he has been taking his medication for Afib regularly. Pt does not take ASA along with his Warfarin. No h/o ablations. He denies SOB, diaphoresis, emesis.   Past Medical History  Diagnosis Date  . DYSKINESIA, ESOPHAGUS   . FIBROMYALGIA   . HYPERLIPIDEMIA     a. Intolerant to statins.  . INSOMNIA     takes Ambien nightly  . Rash and other nonspecific skin eruption 04/12/2009    no cause found saw dermatologists x 2 and allergist  . Paroxysmal atrial fibrillation ([place])     a. CHA2DS2VASc = 3--> takes Coumadin;  b. 03/15/2015 Successful TEE/DCCV;  c. 03/2015 recurrent afib, Amio d/c'd in setting of hyperthyroidism.  . Cardiomyopathy ([place])     a. EF 25% TEE July 2013; b. EF normalized 2015;  c. 03/2015 Echo: EF 40-45%, difrf HK, PASP 38 mmHg, Mild MR, sev LAE/RAE.  [name] Myocardial infarction [facility])     a. 2012 Myoview notable for prior infarct;  b. 03/2015 Lexiscan CL: EF 37%, diff HK, small area of inferior infarct from apex to base-->Med Rx.  . ANXIETY     takes Xanax nightly  . ASTHMA     Albuterol prn and Advair daily;also takes Prednisone  daily  . GERD (gastroesophageal reflux disease)        . Type II diabetes mellitus ([place])        . Essential hypertension        . COPD (chronic obstructive pulmonary disease) ([place])   . O2 dependent     uses oxygen 2 and 1/2 liters per [place] at hs  . SLEEP APNEA, OBSTRUCTIVE     a. doesn't use CPAP  . Peripheral neuropathy ([place])   . Arthritis   . Chronic constipation     takes OTC stool softener  . Diverticulitis   . Glaucoma   . DEPRESSION     takes Zoloft and Doxepin daily  . Anemia     supposed to be taking Vit B but doesn't  . Syncope     a. 03/2015 s/p MDT LINQ.   Past Surgical History  Procedure Laterality Date  . Tonsillectomy    . Acne cyst removal      2 on back   . Cardioversion  04/18/2012    Procedure: CARDIOVERSION;  Surgeon: Fay Records, MD;  Location: [place];  Service: Cardiovascular;  Laterality: N/A;  . Tee without cardioversion  04/25/2012    Procedure: TRANSESOPHAGEAL ECHOCARDIOGRAM (TEE);  Surgeon: Thayer Headings, MD;  Location: [place];  Service: Cardiovascular;  Laterality: N/A;  . Cardioversion  04/25/2012    Procedure: CARDIOVERSION;  Surgeon: Thayer Headings, MD;  Location: [name] ENDOSCOPY;  Service: Cardiovascular;  Laterality: N/A;  . Cardioversion  04/25/2012    Procedure: CARDIOVERSION;  Surgeon: Fay Records, MD;  Location: [place];  Service: Cardiovascular;  Laterality: N/A;  . Cardioversion  05/09/2012    Procedure: CARDIOVERSION;  Surgeon: Sherren Mocha, MD;  Location: [place];  Service: Cardiovascular;  Laterality: N/A;  changed from crenshaw to cooper by trish/leone-endo  . Colonoscopy    . Esophagogastroduodenoscopy    . Total knee arthroplasty Right 06/15/2014    Procedure: TOTAL KNEE ARTHROPLASTY;  Surgeon: Renette Butters, MD;  Location: [place];  Service: Orthopedics;  Laterality: Right;  . Colonoscopy with propofol N/A 10/21/2014    Procedure: COLONOSCOPY WITH PROPOFOL;  Surgeon: Ladene Artist, MD;  Location: [facility] ENDOSCOPY;  Service: Endoscopy;   Laterality: N/A;  . Tee without cardioversion N/A 03/15/2015    Procedure: TRANSESOPHAGEAL ECHOCARDIOGRAM (TEE);  Surgeon: Thayer Headings, MD;  Location: [place];  Service: Cardiovascular;  Laterality: N/A;  . Cardioversion N/A 03/15/2015    Procedure: CARDIOVERSION;  Surgeon: Thayer Headings, MD;  Location: [place];  Service: Cardiovascular;  Laterality: N/A;  . Ep implantable device N/A 04/06/2015    Procedure: Loop Recorder Insertion;  Surgeon: Evans Lance, MD;  Location: [place] CV LAB;  Service: Cardiovascular;  Laterality: N/A;   Family History  Problem Relation Age of Onset  . COPD Mother   . Asthma Mother   . Colon polyps Mother   . Allergies Mother   . Hypothyroidism Mother   . Asthma Maternal Grandmother   . Colon cancer Neg Hx    Social History  Substance Use Topics  . Smoking status: Former Smoker -- 2.00 packs/day for 30 years    Types: Cigarettes    Quit date: 10/16/2007  . Smokeless tobacco: Never Used  . Alcohol Use: No    Review of Systems 10 Systems reviewed and are negative for acute change except as noted in the HPI.   Allergies  Pravastatin sodium; Simvastatin; Statins; and Tape  Home Medications   Prior to Admission medications   Medication Sig Start Date End Date Taking? Authorizing Provider  albuterol (PROVENTIL HFA;VENTOLIN HFA) 108 (90 BASE) MCG/ACT inhaler Inhale 2 puffs into the lungs 4 times a day as needed   Yes Historical Provider, MD  ALPRAZolam (XANAX) 0.5 MG tablet TAKE 1 TABLET BY MOUTH TWICE A DAY AS NEEDED FOR ANXIETY 08/04/15  Yes [name], MD  carisoprodol (SOMA) 350 MG tablet TAKE 1 TABLET BY MOUTH 3 TIMES A DAY AS NEEDED 08/16/15  Yes [name], MD  clobetasol cream (TEMOVATE) [id number] % Apply 1 application topically daily as needed (itching). 04/26/15  Yes [name], MD  digoxin (LANOXIN) 0.25 MG tablet Take 1 tablet (0.25 mg total) by mouth daily. 06/02/15  Yes Deboraha Sprang, MD  diltiazem (CARDIZEM CD) 120 MG 24  hr capsule Take 1 capsule (120 mg total) by mouth daily. 08/10/15  Yes Deboraha Sprang, MD  doxepin (SINEQUAN) 25 MG capsule Take 25 mg by mouth 2 (two) times daily.   Yes Historical Provider, MD  ferrous sulfate 325 (65 FE) MG tablet Take 325 mg by mouth daily.   Yes Historical Provider, MD  Fluticasone-Salmeterol (ADVAIR) 250-50 MCG/DOSE AEPB Inhale 1 puff into the lungs 2 (two) times daily. 08/24/15  Yes [name], MD  hydrocortisone 2.5 % cream APPLY TOPICALLY 2  TIMES DAILY. 08/11/15  Yes [name], MD  hydrOXYzine (ATARAX/VISTARIL) 25 MG tablet Take 25 mg by mouth 3 (three) times daily as needed.   Yes Historical Provider, MD  hydrOXYzine (ATARAX/VISTARIL) 25 MG tablet TAKE 1 TABLET BY MOUTH 3 TIMES A DAY FOR ITCHING 08/10/15  Yes [name], MD  ibuprofen (ADVIL,MOTRIN) 200 MG tablet Take 400 mg by mouth at bedtime as needed for mild pain (pain).  09/15/14  Yes Historical Provider, MD  Linaclotide Rolan Lipa) 145 MCG CAPS capsule Take 145 mcg by mouth daily as needed (Crampy pain).    Yes Historical Provider, MD  Loratadine 10 MG CAPS Take 10 mg by mouth daily.  09/15/14  Yes Historical Provider, MD  methimazole (TAPAZOLE) 10 MG tablet Take 2 tablets (20 mg total) by mouth 2 (two) times daily. 05/24/15  Yes Elayne Snare, MD  nebivolol (BYSTOLIC) 5 MG tablet Take 5 mg by mouth daily.   Yes Historical Provider, MD  omeprazole (PRILOSEC) 20 MG capsule Take 20 mg by mouth daily with supper.    Yes Historical Provider, MD  polyethylene glycol (MIRALAX / GLYCOLAX) packet Take 17 g by mouth daily as needed for mild constipation.   Yes Historical Provider, MD  potassium chloride SA (K-DUR,KLOR-CON) 20 MEQ tablet Take 2 tablets (40 mEq total) by mouth daily. 04/06/15  Yes Amber Sena Slate, NP  sertraline (ZOLOFT) 100 MG tablet Take two tablets (200 mg) by mouth once daily 04/22/15  Yes Historical Provider, MD  tolnaftate (TINACTIN) 1 % powder Apply 1 application topically 2 (two) times daily. Apply to Groin  area   Yes Historical Provider, MD  torsemide (DEMADEX) 20 MG tablet Take two tablets (40 mg) by mouth once daily   Yes Historical Provider, MD  traZODone (DESYREL) 100 MG tablet Take 1/2 tablet (50 mg) by mouth once daily   Yes Historical Provider, MD  warfarin (COUMADIN) 5 MG tablet Take 2.5-5 mg by mouth daily. Take 1 tablet on Monday, Wednesday and Friday then take 1/2 tablet all the other days 05/20/15  Yes Historical Provider, MD  witch hazel-glycerin (TUCKS) pad Apply 1 application topically as needed for itching, irritation or hemorrhoids.  09/15/14  Yes Historical Provider, MD  zolpidem (AMBIEN) 10 MG tablet Take 10 mg by mouth at bedtime as needed for sleep.   Yes Historical Provider, MD  ADVAIR DISKUS 250-50 MCG/DOSE AEPB INHALE 1 PUFF EVERY DAY AS DIRECTED Patient not taking: Reported on 08/31/2015 08/26/15   [name], MD   BP 121/57 mmHg  Pulse 60  Temp(Src) 97.8 F (36.6 C) (Oral)  Resp 14  Ht 5\' 10"  (1.778 m)  Wt 310 lb (140.615 kg)  BMI 44.48 kg/m2  SpO2 97% Physical Exam  Constitutional: He is oriented to person, place, and time. Vital signs are normal. He appears well-developed and well-nourished.  Non-toxic appearance. He does not appear ill. No distress.  Obese male.  HENT:  Head: Normocephalic and atraumatic.  Nose: Nose normal.  Mouth/Throat: Oropharynx is clear and moist. No oropharyngeal exudate.  Eyes: Conjunctivae and EOM are normal. Pupils are equal, round, and reactive to light. No scleral icterus.  Neck: Normal range of motion. Neck supple. No tracheal deviation, no edema, no erythema and normal range of motion present. No thyroid mass and no thyromegaly present.  Cardiovascular: Normal rate, S1 normal, S2 normal, normal heart sounds, intact distal pulses and normal pulses.  An irregularly irregular rhythm present. Exam reveals no gallop and no friction rub.  No murmur heard. Irregularly irregular rhythm.   Pulmonary/Chest: Effort normal and breath sounds  normal. No respiratory distress. He has no wheezes. He has no rhonchi. He has no rales.  Abdominal: Soft. Normal appearance and bowel sounds are normal. He exhibits no distension, no ascites and no mass. There is no hepatosplenomegaly. There is no tenderness. There is no rebound, no guarding and no CVA tenderness.  Musculoskeletal: Normal range of motion. He exhibits edema. He exhibits no tenderness.  Pt is edematous.   Lymphadenopathy:    He has no cervical adenopathy.  Neurological: He is alert and oriented to person, place, and time. He has normal strength. No cranial nerve deficit or sensory deficit.  Skin: Skin is warm, dry and intact. No petechiae and no rash noted. He is not diaphoretic. No erythema. No pallor.  Psychiatric: He has a normal mood and affect. His behavior is normal. Judgment normal.  Nursing note and vitals reviewed.  ED Course  Procedures (including critical care time) DIAGNOSTIC STUDIES: Oxygen Saturation is 97% on RA, normal by my interpretation.    COORDINATION OF CARE: 11:07 PM Discussed treatment plan with pt at bedside and pt agreed to plan.   Labs Review Labs Reviewed  CBC WITH DIFFERENTIAL/PLATELET - Abnormal; Notable for the following:    WBC 10.7 (*)    Hemoglobin 12.3 (*)    HCT 38.1 (*)    RDW 19.2 (*)    All other components within normal limits  COMPREHENSIVE METABOLIC PANEL - Abnormal; Notable for the following:    Potassium 3.1 (*)    Creatinine, Ser 1.45 (*)    GFR calc non Af Amer 51 (*)    GFR calc Af Amer 59 (*)    All other components within normal limits  PROTIME-INR - Abnormal; Notable for the following:    Prothrombin Time 24.3 (*)    INR 2.21 (*)    All other components within normal limits  TROPONIN I  MAGNESIUM    Imaging Review Dg Chest 2 View  08/31/2015  CLINICAL DATA:  Acute onset of palpitations.  Initial encounter. EXAM: CHEST  2 VIEW COMPARISON:  Chest radiograph performed 03/30/2015 FINDINGS: The lungs are  well-aerated. Mild vascular congestion is noted, with minimal bibasilar atelectasis. There is no evidence of pleural effusion or pneumothorax. The heart is enlarged.  No acute osseous abnormalities are seen. IMPRESSION: Mild vascular congestion and cardiomegaly, with minimal bibasilar atelectasis. Electronically Signed   By: Garald Balding M.D.   On: 08/31/2015 23:11   I have personally reviewed and evaluated these images and lab results as part of my medical decision-making.   EKG Interpretation None        MDM   Final diagnoses:  None    Patient presents to the ED for chest pressure and palpitation for the past couple of weeks.  He was called in by cardiology to the ED for evaluation.  His chest pain he states is consistent with angina.  Troponin negative and EKG is unchanged.  I spoke with Dr. [name] with cardiology who does not believe this is related to the patient's atrial fibrillation. He does intermittently go into RVR in the emergency department. He recommends medical admission for chest pain rule out. I spoke with hospitalist who accepts the patient to telemetry.  I personally performed the services described in this documentation, which was scribed in my presence. The recorded information has been reviewed and is accurate.     Everlene Balls, MD 09/01/15 0030

## 2015-09-01 ENCOUNTER — Other Ambulatory Visit: Payer: Self-pay

## 2015-09-01 ENCOUNTER — Encounter (HOSPITAL_COMMUNITY): Payer: Self-pay | Admitting: Internal Medicine

## 2015-09-01 ENCOUNTER — Telehealth: Payer: Self-pay | Admitting: Internal Medicine

## 2015-09-01 DIAGNOSIS — I481 Persistent atrial fibrillation: Secondary | ICD-10-CM

## 2015-09-01 DIAGNOSIS — R002 Palpitations: Secondary | ICD-10-CM | POA: Diagnosis not present

## 2015-09-01 DIAGNOSIS — R0789 Other chest pain: Secondary | ICD-10-CM | POA: Diagnosis not present

## 2015-09-01 DIAGNOSIS — E785 Hyperlipidemia, unspecified: Secondary | ICD-10-CM | POA: Diagnosis not present

## 2015-09-01 DIAGNOSIS — F419 Anxiety disorder, unspecified: Secondary | ICD-10-CM | POA: Diagnosis not present

## 2015-09-01 DIAGNOSIS — K219 Gastro-esophageal reflux disease without esophagitis: Secondary | ICD-10-CM | POA: Diagnosis not present

## 2015-09-01 DIAGNOSIS — E0590 Thyrotoxicosis, unspecified without thyrotoxic crisis or storm: Secondary | ICD-10-CM | POA: Diagnosis not present

## 2015-09-01 DIAGNOSIS — I10 Essential (primary) hypertension: Secondary | ICD-10-CM | POA: Diagnosis not present

## 2015-09-01 DIAGNOSIS — R071 Chest pain on breathing: Secondary | ICD-10-CM

## 2015-09-01 DIAGNOSIS — E119 Type 2 diabetes mellitus without complications: Secondary | ICD-10-CM

## 2015-09-01 DIAGNOSIS — R079 Chest pain, unspecified: Secondary | ICD-10-CM | POA: Diagnosis not present

## 2015-09-01 DIAGNOSIS — F329 Major depressive disorder, single episode, unspecified: Secondary | ICD-10-CM | POA: Diagnosis not present

## 2015-09-01 DIAGNOSIS — G4733 Obstructive sleep apnea (adult) (pediatric): Secondary | ICD-10-CM | POA: Diagnosis not present

## 2015-09-01 LAB — GLUCOSE, CAPILLARY
Glucose-Capillary: 84 mg/dL (ref 65–99)
Glucose-Capillary: 94 mg/dL (ref 65–99)

## 2015-09-01 LAB — TROPONIN I
Troponin I: 0.03 ng/mL (ref <0.031)
Troponin I: <0.03 ng/mL (ref <0.031)

## 2015-09-01 LAB — BASIC METABOLIC PANEL
Anion gap: 7 (ref 5–15)
BUN: 15 mg/dL (ref 6–20)
CHLORIDE: 101 mmol/L (ref 101–111)
CO2: 29 mmol/L (ref 22–32)
CREATININE: 1.04 mg/dL (ref 0.61–1.24)
Calcium: 8.9 mg/dL (ref 8.9–10.3)
GFR calc non Af Amer: >60 mL/min (ref >60)
Glucose, Bld: 81 mg/dL (ref 65–99)
Potassium: 3.4 mmol/L — ABNORMAL LOW (ref 3.5–5.1)
Sodium: 137 mmol/L (ref 135–145)

## 2015-09-01 LAB — MAGNESIUM: MAGNESIUM: 1.9 mg/dL (ref 1.7–2.4)

## 2015-09-01 LAB — DIGOXIN LEVEL: Digoxin Level: <0.2 ng/mL — ABNORMAL LOW (ref 0.8–2.0)

## 2015-09-01 MED ORDER — HYDROCODONE-ACETAMINOPHEN 5-325 MG PO TABS
1.0000 | ORAL_TABLET | Freq: Four times a day (QID) | ORAL | Status: DC | PRN
  Administered 2015-09-01: 1 via ORAL
  Filled 2015-09-01: qty 1

## 2015-09-01 MED ORDER — WITCH HAZEL-GLYCERIN EX PADS
1.0000 "application " | MEDICATED_PAD | CUTANEOUS | Status: DC | PRN
  Filled 2015-09-01: qty 100

## 2015-09-01 MED ORDER — TRAZODONE HCL 100 MG PO TABS: 100.0000 mg | ORAL_TABLET | Freq: Every day | ORAL | Status: DC

## 2015-09-01 MED ORDER — POTASSIUM CHLORIDE CRYS ER 20 MEQ PO TBCR
40.0000 meq | EXTENDED_RELEASE_TABLET | Freq: Once | ORAL | Status: AC
  Administered 2015-09-01: 40 meq via ORAL
  Filled 2015-09-01: qty 2

## 2015-09-01 MED ORDER — ACETAMINOPHEN 325 MG PO TABS: 650.0000 mg | ORAL_TABLET | ORAL | Status: DC | PRN

## 2015-09-01 MED ORDER — ALPRAZOLAM 0.5 MG PO TABS
0.5000 mg | ORAL_TABLET | Freq: Three times a day (TID) | ORAL | Status: DC | PRN
  Filled 2015-09-01: qty 1

## 2015-09-01 MED ORDER — HYDROXYZINE HCL 25 MG PO TABS: 25.0000 mg | ORAL_TABLET | ORAL | Status: DC | PRN

## 2015-09-01 MED ORDER — TOLNAFTATE 1 % EX POWD: 1.0000 "application " | Freq: Two times a day (BID) | CUTANEOUS | Status: DC

## 2015-09-01 MED ORDER — DIGOXIN 250 MCG PO TABS
0.2500 mg | ORAL_TABLET | Freq: Every day | ORAL | Status: DC
  Administered 2015-09-01: 0.25 mg via ORAL
  Filled 2015-09-01: qty 1

## 2015-09-01 MED ORDER — FERROUS SULFATE 325 (65 FE) MG PO TABS
325.0000 mg | ORAL_TABLET | Freq: Every day | ORAL | Status: DC
  Administered 2015-09-01: 325 mg via ORAL
  Filled 2015-09-01: qty 1

## 2015-09-01 MED ORDER — MICONAZOLE NITRATE POWD
Freq: Two times a day (BID) | Status: DC
  Administered 2015-09-01: 10:00:00 via TOPICAL

## 2015-09-01 MED ORDER — CARISOPRODOL 350 MG PO TABS: 350.0000 mg | ORAL_TABLET | Freq: Three times a day (TID) | ORAL | Status: DC | PRN

## 2015-09-01 MED ORDER — DILTIAZEM HCL ER COATED BEADS 120 MG PO CP24: 120.0000 mg | ORAL_CAPSULE | Freq: Every day | ORAL | Status: DC

## 2015-09-01 MED ORDER — LINACLOTIDE 145 MCG PO CAPS
145.0000 ug | ORAL_CAPSULE | Freq: Every day | ORAL | Status: DC | PRN
  Filled 2015-09-01: qty 1

## 2015-09-01 MED ORDER — WARFARIN SODIUM 2.5 MG PO TABS: 2.5000 mg | ORAL_TABLET | ORAL | Status: DC

## 2015-09-01 MED ORDER — LORATADINE 10 MG PO TABS
10.0000 mg | ORAL_TABLET | Freq: Every day | ORAL | Status: DC
  Administered 2015-09-01: 10 mg via ORAL
  Filled 2015-09-01: qty 1

## 2015-09-01 MED ORDER — FUROSEMIDE 10 MG/ML IJ SOLN
60.0000 mg | Freq: Once | INTRAMUSCULAR | Status: AC
  Administered 2015-09-01: 60 mg via INTRAVENOUS
  Filled 2015-09-01: qty 6

## 2015-09-01 MED ORDER — NEBIVOLOL HCL 5 MG PO TABS
5.0000 mg | ORAL_TABLET | Freq: Every day | ORAL | Status: DC
  Administered 2015-09-01: 5 mg via ORAL
  Filled 2015-09-01: qty 1

## 2015-09-01 MED ORDER — POLYETHYLENE GLYCOL 3350 17 G PO PACK: 17.0000 g | PACK | Freq: Every day | ORAL | Status: DC | PRN

## 2015-09-01 MED ORDER — METHIMAZOLE 10 MG PO TABS
20.0000 mg | ORAL_TABLET | Freq: Two times a day (BID) | ORAL | Status: DC
  Administered 2015-09-01: 20 mg via ORAL
  Filled 2015-09-01: qty 2

## 2015-09-01 MED ORDER — ZOLPIDEM TARTRATE 5 MG PO TABS: 10.0000 mg | ORAL_TABLET | Freq: Every evening | ORAL | Status: DC | PRN

## 2015-09-01 MED ORDER — TORSEMIDE 20 MG PO TABS
40.0000 mg | ORAL_TABLET | Freq: Every day | ORAL | Status: DC
  Administered 2015-09-01: 40 mg via ORAL
  Filled 2015-09-01: qty 2

## 2015-09-01 MED ORDER — WARFARIN SODIUM 5 MG PO TABS: 5.0000 mg | ORAL_TABLET | ORAL | Status: DC

## 2015-09-01 MED ORDER — LORATADINE 10 MG PO CAPS: 10.0000 mg | ORAL_CAPSULE | Freq: Every day | ORAL | Status: DC

## 2015-09-01 MED ORDER — MOMETASONE FURO-FORMOTEROL FUM 100-5 MCG/ACT IN AERO
2.0000 | INHALATION_SPRAY | Freq: Two times a day (BID) | RESPIRATORY_TRACT | Status: DC
  Administered 2015-09-01: 2 via RESPIRATORY_TRACT
  Filled 2015-09-01: qty 8.8

## 2015-09-01 MED ORDER — WARFARIN SODIUM 2.5 MG PO TABS: 2.5000 mg | ORAL_TABLET | Freq: Every day | ORAL | Status: DC

## 2015-09-01 MED ORDER — NITROGLYCERIN 0.4 MG SL SUBL
0.4000 mg | SUBLINGUAL_TABLET | SUBLINGUAL | Status: DC | PRN
  Filled 2015-09-01: qty 1

## 2015-09-01 MED ORDER — PANTOPRAZOLE SODIUM 40 MG PO TBEC
40.0000 mg | DELAYED_RELEASE_TABLET | Freq: Every day | ORAL | Status: DC
  Administered 2015-09-01: 40 mg via ORAL
  Filled 2015-09-01: qty 1

## 2015-09-01 MED ORDER — WARFARIN - PHARMACIST DOSING INPATIENT: Freq: Every day | Status: DC

## 2015-09-01 MED ORDER — SERTRALINE HCL 100 MG PO TABS
100.0000 mg | ORAL_TABLET | Freq: Every day | ORAL | Status: DC
  Administered 2015-09-01: 100 mg via ORAL
  Filled 2015-09-01: qty 1

## 2015-09-01 MED ORDER — MAGNESIUM SULFATE 2 GM/50ML IV SOLN
2.0000 g | Freq: Once | INTRAVENOUS | Status: AC
  Administered 2015-09-01: 2 g via INTRAVENOUS
  Filled 2015-09-01: qty 50

## 2015-09-01 MED ORDER — DILTIAZEM HCL ER COATED BEADS 120 MG PO CP24
120.0000 mg | ORAL_CAPSULE | Freq: Every day | ORAL | Status: DC
  Administered 2015-09-01: 120 mg via ORAL
  Filled 2015-09-01: qty 1

## 2015-09-01 MED ORDER — WARFARIN - PHYSICIAN DOSING INPATIENT: Freq: Every day | Status: DC

## 2015-09-01 MED ORDER — DOXEPIN HCL 25 MG PO CAPS
25.0000 mg | ORAL_CAPSULE | Freq: Two times a day (BID) | ORAL | Status: DC
  Administered 2015-09-01: 25 mg via ORAL
  Filled 2015-09-01 (×2): qty 1

## 2015-09-01 MED ORDER — ONDANSETRON HCL 4 MG/2ML IJ SOLN
4.0000 mg | Freq: Four times a day (QID) | INTRAMUSCULAR | Status: DC | PRN
  Administered 2015-09-01: 4 mg via INTRAVENOUS
  Filled 2015-09-01: qty 2

## 2015-09-01 MED ORDER — SODIUM CHLORIDE 0.9 % IJ SOLN
3.0000 mL | Freq: Two times a day (BID) | INTRAMUSCULAR | Status: DC
  Administered 2015-09-01 (×2): 3 mL via INTRAVENOUS

## 2015-09-01 MED ORDER — POTASSIUM CHLORIDE CRYS ER 20 MEQ PO TBCR
40.0000 meq | EXTENDED_RELEASE_TABLET | Freq: Every day | ORAL | Status: DC
  Administered 2015-09-01: 40 meq via ORAL
  Filled 2015-09-01: qty 2

## 2015-09-01 NOTE — Consult Note (Addendum)
CARDIOLOGY CONSULT NOTE   Patient ID: AARAN RUNDE MRN: [id number] DOB/AGE: April 20, 1956 59 y.o.  Admit date: 08/31/2015  Primary Physician   [name], MD Primary Cardiologist   Dr. [name]  Reason for Consultation   CP  HPI: [name] is a 59 y.o. male with a history of RBBB, persistent atrial fibrillation on coumadin, morbid obesity, HTN, HLD, DMT2, hyperthyroidism on methimazole, fibromyalgia, chronic systolic CHF, OSA intolerant to CPAP, and recent syncope s/p Linq placement who presented to [facility] ED last night (08/31/15) for chest pain.   He was first diagnosed with atrial fibrillation in 2001 and was placed on amiodarone in 2013.Echocardiogram 04/2012 demonstrated EF 20-25% with severe biatrial enlargement. Repeat echo 05/2013 and demonstrated an intercurrent normalization of LV function with biatrial enlargement and mild RV dilatation. He had maintained SR until 11/2014 when he developed palpitations, shortness of breath, and fatigue. He was seen by Dr [name] and underwent cardioversion but had ERAF after 1 day. He was scheduled to be seen in the AF clinic but on the day of appointment had 2 episodes of orthostatic syncope and called EMS. He was then transported to [facility] for further evaluation. He was found to be in AF withRVR. He underwent myoview scan for syncope with was considered high risk because of decreased EF (30-44%) finding consistent with prior MI w/ small area of inferior wall infarct but no ischemia. Also diffuse HK. Echocardiogram demonstrated EF of 40-45%. Because of hyperthyroidism on labs, amiodarone was discontinued with a plan for rate control. His rate control was titrated and prior to discharge underwent ILR implantation for syncope.During this admission Ranexa was also discontinued.He was discharged on Methimazole and now followed by endocrinology.  Per Dr. Aquilla Hacker last office note, his thyroid is now under control. He scheduled a follow up 2D ECHO which was  done on 08/29/15 which revealed EF 30-35%, mild LV dilation, mod diffuse HK with no identifiable regional variations. Severe LA dilation, mod RV dilation, mild RV systolic dysfunction, severe RA dilation.  Per review of records, the patient has chronic dyspnea. He was called by the office last night about a 5 second pause noted on LINQ transmission from 08/29/15. Patient reported that he was asleep and asymptomatic at the time of the episode. He did mention that he had been having intermittent left chest pressure "for the last couple of weeks" and he was told to go to the ED.   He complains of constant central chest pain that radiates to his back. It is tender to palpation and has been constant for the last couple week. At its worst it is a 4/10. No associated SOB, nausea or diaphoresis. It is not related to exertion. He continues to have active chest pain currently. He has chronic LE edema and always sleeps in a recliner. His dyspnea is no worse than usual   Past Medical History  Diagnosis Date  . DYSKINESIA, ESOPHAGUS   . FIBROMYALGIA   . HYPERLIPIDEMIA     a. Intolerant to statins.  . INSOMNIA     takes Ambien nightly  . Rash and other nonspecific skin eruption 04/12/2009    no cause found saw dermatologists x 2 and allergist  . Paroxysmal atrial fibrillation ([place])     a. CHA2DS2VASc = 3--> takes Coumadin;  b. 03/15/2015 Successful TEE/DCCV;  c. 03/2015 recurrent afib, Amio d/c'd in setting of hyperthyroidism.  . Cardiomyopathy ([place])     a. EF 25% TEE July 2013; b. EF  normalized 2015;  c. 03/2015 Echo: EF 40-45%, difrf HK, PASP 38 mmHg, Mild MR, sev LAE/RAE.  [name] Myocardial infarction [facility])     a. 2012 Myoview notable for prior infarct;  b. 03/2015 Lexiscan CL: EF 37%, diff HK, small area of inferior infarct from apex to base-->Med Rx.  . ANXIETY     takes Xanax nightly  . ASTHMA     Albuterol prn and Advair daily;also takes Prednisone daily  . GERD (gastroesophageal reflux disease)          . Type II diabetes mellitus ([place])        . Essential hypertension        . COPD (chronic obstructive pulmonary disease) ([place])   . O2 dependent     uses oxygen 2 and 1/2 liters per [place] at hs  . SLEEP APNEA, OBSTRUCTIVE     a. doesn't use CPAP  . Peripheral neuropathy ([place])   . Arthritis   . Chronic constipation     takes OTC stool softener  . Diverticulitis   . Glaucoma   . DEPRESSION     takes Zoloft and Doxepin daily  . Anemia     supposed to be taking Vit B but doesn't  . Syncope     a. 03/2015 s/p MDT LINQ.     Past Surgical History  Procedure Laterality Date  . Tonsillectomy    . Acne cyst removal      2 on back   . Cardioversion  04/18/2012    Procedure: CARDIOVERSION;  Surgeon: Fay Records, MD;  Location: [place];  Service: Cardiovascular;  Laterality: N/A;  . Tee without cardioversion  04/25/2012    Procedure: TRANSESOPHAGEAL ECHOCARDIOGRAM (TEE);  Surgeon: Thayer Headings, MD;  Location: [place];  Service: Cardiovascular;  Laterality: N/A;  . Cardioversion  04/25/2012    Procedure: CARDIOVERSION;  Surgeon: Thayer Headings, MD;  Location: [facility] ENDOSCOPY;  Service: Cardiovascular;  Laterality: N/A;  . Cardioversion  04/25/2012    Procedure: CARDIOVERSION;  Surgeon: Fay Records, MD;  Location: [place];  Service: Cardiovascular;  Laterality: N/A;  . Cardioversion  05/09/2012    Procedure: CARDIOVERSION;  Surgeon: Sherren Mocha, MD;  Location: [place];  Service: Cardiovascular;  Laterality: N/A;  changed from crenshaw to cooper by trish/leone-endo  . Colonoscopy    . Esophagogastroduodenoscopy    . Total knee arthroplasty Right 06/15/2014    Procedure: TOTAL KNEE ARTHROPLASTY;  Surgeon: Renette Butters, MD;  Location: [place];  Service: Orthopedics;  Laterality: Right;  . Colonoscopy with propofol N/A 10/21/2014    Procedure: COLONOSCOPY WITH PROPOFOL;  Surgeon: Ladene Artist, MD;  Location: [facility] ENDOSCOPY;  Service: Endoscopy;  Laterality: N/A;  . Tee without cardioversion N/A  03/15/2015    Procedure: TRANSESOPHAGEAL ECHOCARDIOGRAM (TEE);  Surgeon: Thayer Headings, MD;  Location: [place];  Service: Cardiovascular;  Laterality: N/A;  . Cardioversion N/A 03/15/2015    Procedure: CARDIOVERSION;  Surgeon: Thayer Headings, MD;  Location: [place];  Service: Cardiovascular;  Laterality: N/A;  . Ep implantable device N/A 04/06/2015    Procedure: Loop Recorder Insertion;  Surgeon: Evans Lance, MD;  Location: [place] CV LAB;  Service: Cardiovascular;  Laterality: N/A;    Allergies  Allergen Reactions  . Pravastatin Sodium Other (See Comments)    myalgia  . Simvastatin Other (See Comments)    severe leg cramps  . Statins Other (See Comments)    myalgia  . Amiodarone  hyperthyroidism  . Tape Other (See Comments)    Skin Tears Use Paper Tape Only    I have reviewed the patient's current medications . digoxin  0.25 mg Oral Daily  . diltiazem  120 mg Oral Daily  . doxepin  25 mg Oral BID  . ferrous sulfate  325 mg Oral Daily  . loratadine  10 mg Oral Daily  . methimazole  20 mg Oral BID  . miconazole nitrate   Topical BID  . mometasone-formoterol  2 puff Inhalation BID  . nebivolol  5 mg Oral Daily  . pantoprazole  40 mg Oral Daily  . potassium chloride SA  40 mEq Oral Daily  . sertraline  100 mg Oral Daily  . sodium chloride  3 mL Intravenous Q12H  . torsemide  40 mg Oral Daily  . traZODone  100 mg Oral QHS  . warfarin  2.5 mg Oral Q T,Th,S,Su-1800  . [START ON 09/02/2015] warfarin  5 mg Oral Q M,W,F-1800  . Warfarin - Pharmacist Dosing Inpatient   Does not apply q1800     acetaminophen, ALPRAZolam, carisoprodol, HYDROcodone-acetaminophen, hydrOXYzine, Linaclotide, nitroGLYCERIN, ondansetron (ZOFRAN) IV, polyethylene glycol, witch hazel-glycerin, zolpidem  Prior to Admission medications   Medication Sig Start Date End Date Taking? Authorizing Provider  albuterol (PROVENTIL HFA;VENTOLIN HFA) 108 (90 BASE) MCG/ACT inhaler Inhale 2 puffs into  the lungs 4 times a day as needed   Yes Historical Provider, MD  ALPRAZolam (XANAX) 0.5 MG tablet TAKE 1 TABLET BY MOUTH TWICE A DAY AS NEEDED FOR ANXIETY 08/04/15  Yes [name], MD  carisoprodol (SOMA) 350 MG tablet TAKE 1 TABLET BY MOUTH 3 TIMES A DAY AS NEEDED 08/16/15  Yes [name], MD  clobetasol cream (TEMOVATE) [id number] % Apply 1 application topically daily as needed (itching). 04/26/15  Yes [name], MD  digoxin (LANOXIN) 0.25 MG tablet Take 1 tablet (0.25 mg total) by mouth daily. 06/02/15  Yes Deboraha Sprang, MD  doxepin (SINEQUAN) 25 MG capsule Take 25 mg by mouth 2 (two) times daily.   Yes Historical Provider, MD  ferrous sulfate 325 (65 FE) MG tablet Take 325 mg by mouth daily.   Yes Historical Provider, MD  Fluticasone-Salmeterol (ADVAIR) 250-50 MCG/DOSE AEPB Inhale 1 puff into the lungs 2 (two) times daily. 08/24/15  Yes [name], MD  hydrocortisone 2.5 % cream APPLY TOPICALLY 2 TIMES DAILY. 08/11/15  Yes [name], MD  hydrOXYzine (ATARAX/VISTARIL) 25 MG tablet Take 25 mg by mouth 3 (three) times daily as needed.   Yes Historical Provider, MD  hydrOXYzine (ATARAX/VISTARIL) 25 MG tablet TAKE 1 TABLET BY MOUTH 3 TIMES A DAY FOR ITCHING 08/10/15  Yes [name], MD  ibuprofen (ADVIL,MOTRIN) 200 MG tablet Take 400 mg by mouth at bedtime as needed for mild pain (pain).  09/15/14  Yes Historical Provider, MD  Linaclotide Rolan Lipa) 145 MCG CAPS capsule Take 145 mcg by mouth daily as needed (Crampy pain).    Yes Historical Provider, MD  Loratadine 10 MG CAPS Take 10 mg by mouth daily.  09/15/14  Yes Historical Provider, MD  methimazole (TAPAZOLE) 10 MG tablet Take 2 tablets (20 mg total) by mouth 2 (two) times daily. 05/24/15  Yes Elayne Snare, MD  nebivolol (BYSTOLIC) 5 MG tablet Take 5 mg by mouth daily.   Yes Historical Provider, MD  omeprazole (PRILOSEC) 20 MG capsule Take 20 mg by mouth daily with supper.    Yes Historical Provider, MD  polyethylene  glycol (MIRALAX / GLYCOLAX)  packet Take 17 g by mouth daily as needed for mild constipation.   Yes Historical Provider, MD  potassium chloride SA (K-DUR,KLOR-CON) 20 MEQ tablet Take 2 tablets (40 mEq total) by mouth daily. 04/06/15  Yes Amber Sena Slate, NP  sertraline (ZOLOFT) 100 MG tablet Take two tablets (200 mg) by mouth once daily 04/22/15  Yes Historical Provider, MD  tolnaftate (TINACTIN) 1 % powder Apply 1 application topically 2 (two) times daily. Apply to Groin area   Yes Historical Provider, MD  torsemide (DEMADEX) 20 MG tablet Take two tablets (40 mg) by mouth once daily   Yes Historical Provider, MD  traZODone (DESYREL) 100 MG tablet Take 1/2 tablet (50 mg) by mouth once daily   Yes Historical Provider, MD  warfarin (COUMADIN) 5 MG tablet Take 2.5-5 mg by mouth daily. Take 1 tablet on Monday, Wednesday and Friday then take 1/2 tablet all the other days 05/20/15  Yes Historical Provider, MD  witch hazel-glycerin (TUCKS) pad Apply 1 application topically as needed for itching, irritation or hemorrhoids.  09/15/14  Yes Historical Provider, MD  zolpidem (AMBIEN) 10 MG tablet Take 10 mg by mouth at bedtime as needed for sleep.   Yes Historical Provider, MD  ADVAIR DISKUS 250-50 MCG/DOSE AEPB INHALE 1 PUFF EVERY DAY AS DIRECTED Patient not taking: Reported on 08/31/2015 08/26/15   [name], MD  diltiazem (CARDIZEM CD) 120 MG 24 hr capsule Take 1 capsule (120 mg total) by mouth daily. 09/01/15   Everlene Balls, MD     Social History   Social History  . Marital Status: Divorced    Spouse Name: N/A  . Number of Children: 2  . Years of Education: N/A   Occupational History  . retired/disabled. prev worked in Therapist, sports.    Social History Main Topics  . Smoking status: Former Smoker -- 2.00 packs/day for 30 years    Types: Cigarettes    Quit date: 10/16/2007  . Smokeless tobacco: Never Used  . Alcohol Use: No  . Drug Use: No  . Sexual Activity: Not on file   Other Topics Concern  . Not on file   Social  History Narrative   Lives alone.    Family Status  Relation Status Death Age  . Mother Alive   . Father Alive    Family History  Problem Relation Age of Onset  . COPD Mother   . Asthma Mother   . Colon polyps Mother   . Allergies Mother   . Hypothyroidism Mother   . Asthma Maternal Grandmother   . Colon cancer Neg Hx      ROS:  Full 14 point review of systems complete and found to be negative unless listed above.  Physical Exam: Blood pressure 111/62, pulse 98, temperature 97.5 F (36.4 C), temperature source Oral, resp. rate 16, height 5\' 10"  (1.778 m), weight 299 lb 8 oz (135.852 kg), SpO2 98 %.  General: Well developed, well nourished, male in no acute distress Head: Eyes PERRLA, No xanthomas.   Normocephalic and atraumatic, oropharynx without edema or exudate.  Lungs: CTAB  Heart: HRRR S1 S2, no rub/gallop, Heart irregular rate and rhythm with S1, S2  murmur. pulses are 2+ extrem.   Neck: No carotid bruits. No lymphadenopathy. no JVD. Abdomen: Bowel sounds present, abdomen soft and non-tender without masses or hernias noted. Msk:  No spine or cva tenderness. No weakness, no joint deformities or effusions. Extremities: No clubbing or cyanosis. 1+ pitting  edema.  Neuro: Alert and oriented X 3. No focal deficits noted. Psych:  Good affect, responds appropriately Skin: No rashes or lesions noted.  Labs:  Lab Results  Component Value Date   WBC 10.7* 08/31/2015   HGB 12.3* 08/31/2015   HCT 38.1* 08/31/2015   MCV 80.9 08/31/2015   PLT 280 08/31/2015    Recent Labs  08/31/15 2228  INR 2.21*    Recent Labs Lab 08/31/15 2228 09/01/15 1024  NA 138 137  K 3.1* 3.4*  CL 102 101  CO2 25 29  BUN 18 15  CREATININE 1.45* 1.04  CALCIUM 9.0 8.9  PROT 7.0  --   BILITOT 0.6  --   ALKPHOS 90  --   ALT 25  --   AST 34  --   GLUCOSE 92 81  ALBUMIN 3.9  --    MAGNESIUM  Date Value Ref Range Status  09/01/2015 1.9 1.7 - 2.4 mg/dL Final    Recent Labs   08/31/15 2228 09/01/15 0438 09/01/15 1024  TROPONINI <0.03 0.03 <0.03   No results for input(s): TROPIPOC in the last 72 hours. PRO B NATRIURETIC PEPTIDE (BNP)  Date/Time Value Ref Range Status  05/01/2012 10:18 AM 109.0* 0.0 - 100.0 pg/mL Final  07/01/2007 03:57 PM 43.0 0.0-100.0 pg/mL Final   Lab Results  Component Value Date   CHOL 99 03/07/2015   HDL 30.10* 03/07/2015   LDLCALC 54 03/07/2015   TRIG 73.0 03/07/2015   No results found for: DDIMER No results found for: LIPASE, AMYLASE TSH  Date/Time Value Ref Range Status  04/01/2015 02:51 AM 0.011* 0.350 - 4.500 uIU/mL Final  02/23/2015 02:38 PM 0.04* 0.35 - 4.50 uIU/mL Final   T4, TOTAL  Date/Time Value Ref Range Status  01/04/2012 01:12 PM 7.7 5.0 - 12.5 ug/dL Final   T3, FREE  Date/Time Value Ref Range Status  06/27/2015 02:03 PM 3.5 2.3 - 4.2 pg/mL Final   VITAMIN B-12  Date/Time Value Ref Range Status  02/23/2015 02:38 PM 288 211 - 911 pg/mL Final   FOLATE  Date/Time Value Ref Range Status  04/01/2015 02:51 AM 30.7 >5.9 ng/mL Final   FERRITIN  Date/Time Value Ref Range Status  04/01/2015 02:51 AM 10* 24 - 336 ng/mL Final   TIBC  Date/Time Value Ref Range Status  04/01/2015 02:51 AM 434 250 - 450 ug/dL Final   IRON  Date/Time Value Ref Range Status  04/01/2015 02:51 AM 16* 45 - 182 ug/dL Final    Echo: Study Conclusions - Procedure narrative: Transthoracic echocardiography. Image quality was suboptimal. The study was technically difficult. Intravenous contrast (Definity) was administered. - Left ventricle: The cavity size was mildly dilated. Wall thickness was normal. Systolic function was moderately to severely reduced. The estimated ejection fraction was in the range of 30% to 35%. Moderate diffuse hypokinesis with no identifiable regional variations. Acoustic contrast opacification revealed no evidence ofthrombus. - Left atrium: The atrium was severely dilated. - Right  ventricle: The cavity size was moderately dilated. Systolic function was mildly reduced. - Right atrium: The atrium was severely dilated.  ECG:  08/31/15: Atrial fibrillation with RBBB. HR 103  Radiology:  Dg Chest 2 View  08/31/2015  CLINICAL DATA:  Acute onset of palpitations.  Initial encounter. EXAM: CHEST  2 VIEW COMPARISON:  Chest radiograph performed 03/30/2015 FINDINGS: The lungs are well-aerated. Mild vascular congestion is noted, with minimal bibasilar atelectasis. There is no evidence of pleural effusion or pneumothorax. The heart is enlarged.  No acute osseous  abnormalities are seen. IMPRESSION: Mild vascular congestion and cardiomegaly, with minimal bibasilar atelectasis. Electronically Signed   By: Garald Balding M.D.   On: 08/31/2015 23:11    ASSESSMENT AND PLAN:    Principal Problem:   Chest pain Active Problems:   Anxiety   Depression   Obstructive sleep apnea   Essential hypertension   GERD   Persistent atrial fibrillation (HCC)   Type II diabetes mellitus ([place])   Hyperlipidemia   [name] is a 59 y.o. male with a history of RBBB, persistent atrial fibrillation on coumadin, morbid obesity, HTN, HLD, DMT2, hyperthyroidism on methimazole, fibromyalgia, chronic systolic CHF, OSA intolerant to CPAP, and recent syncope s/p Linq placement who presented to [facility] ED last night (08/31/15) for chest pain.   Chest pain- chest pain is atypical and reproducible on exam. He is very tender to palpation -- Troponin neg x3 despite ongoing chest pain. ECG with chronic RBBB and atrial fibrillation so difficult to interpret. Last myoview 03/2015 which negative for ischemia but high risk for low EF. Possible old infarct.  -- With negative enzymes and reassuring recent stress test, we do not feel further ischemic w/up is indicated at this time. He is okay to be discharged home.   Acute on chronic systolic CHF/NICM- most recent 2D ECHO (08/29/15) revealed EF 30-35%, mild LV dilation,  mod diffuse HK with no identifiable regional variations. Severe LA dilation, mod RV dilation, mild RV systolic dysfunction, severe RA dilation.  -- He does have some mild pulmonary vascular congestion on CXR, but he feels okay. Will give him a shot of IV lasix and send home on regular demedex dosing. Creat okay.   Persistent atrial fibrillation- restoratation of NSR will be difficult with severely dilated LA. He is closely followed by Dr. [name]. He has an appointment with him next week. They may try to pursue rhythm control now that his thyroid is under control. Continue coumadin.   Previous syncope s/p LINQ- had a 5 second pause which was asymptomatic (he was asleep). Patient known to have bradycardia at night likely due to untreated OSA.   Hyperthyroidism- this has been treated with methimazole and he is closely followed by endocrinology.   DM: Managed by endocrine and PMD.  Signed: Eileen Stanford, PA-C 09/01/2015 1:12 PM  Pager [phone]  Co-Sign MD   I have examined the patient and reviewed assessment and plan and discussed with patient.  Agree with above as stated.  Atypical chest pain.  Reproducible with palpation.  Low risk nuclear several months ago.  WOuld not pursue further testing at this time. OK to discharge with OP f/u.  Pause during sleep.  Does not appear grossly volume overloaded.  F/u with Dr. [name] next week.  Kanitra Purifoy S.

## 2015-09-01 NOTE — H&P (Signed)
[facility] History and Physical  [name] [id number] DOB: 1956-07-24 DOA: 08/31/2015  Referring physician: Everlene Balls, MD PCP: [name], MD   Chief Complaint: Chest pain  HPI: [name] is a 59 y.o. male with a past medical history of CAD, paroxysmal atrial fibrillation, hyperlipidemia, hypertension, morbid obesity, depression, anxiety, type 2 diabetes, obstructive sleep apnea, asthma, GERD, chronic back pain who was referred in by his cardiologist to come to the emergency department due to the patient having a strip on his cardiac event recorder that was showing "a racing heart". He has been having pressure-like chest pain for the past 2 weeks. He is states that this is non radiated, associated with mild dyspnea, nausea and palpitations. He denies dizziness, diaphoresis, PND, orthopnea, but complains of lower extremity edema. 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 He had an echocardiogram 2 days ago which shows improvement of his ejection fraction.  He is currently in no acute distress. The patient sometimes repeats the same questions shortly after being given an answer.   Review of Systems:  Constitutional:  No weight loss, night sweats, Fevers, chills, frequent fatigue.  HEENT:  No headaches, Difficulty swallowing,Tooth/dental problems,Sore throat,  No sneezing, itching, ear ache, nasal congestion, post nasal drip,  Cardio-vascular:  As above mentioned. GI:  No heartburn, indigestion, abdominal pain, nausea, vomiting, diarrhea, change in bowel habits, loss of appetite  Resp:  Positive shortness of breath with exertion or at rest. No wheezing no cough no hemoptysis. Skin:  no rash or lesions.  GU:  no dysuria, change in color of urine, no urgency or frequency. No flank pain.  Musculoskeletal:  Chronic back pain.    Psych:  No change in mood or affect. History of depression or anxiety.   The patient seems to be forgetful during the interview, as he has been asking me the same questions more than once.  Past Medical History  Diagnosis Date  . DYSKINESIA, ESOPHAGUS   . FIBROMYALGIA   . HYPERLIPIDEMIA     a. Intolerant to statins.  . INSOMNIA     takes Ambien nightly  . Rash and other nonspecific skin eruption 04/12/2009    no cause found saw dermatologists x 2 and allergist  . Paroxysmal atrial fibrillation ([place])     a. CHA2DS2VASc = 3--> takes Coumadin;  b. 03/15/2015 Successful TEE/DCCV;  c. 03/2015 recurrent afib, Amio d/c'd in setting of hyperthyroidism.  . Cardiomyopathy ([place])     a. EF 25% TEE July 2013; b. EF normalized 2015;  c. 03/2015 Echo: EF 40-45%, difrf HK, PASP 38 mmHg, Mild MR, sev LAE/RAE.  [name] Myocardial infarction [facility])     a. 2012 Myoview notable for prior infarct;  b. 03/2015 Lexiscan CL: EF 37%, diff HK, small area of inferior infarct from apex to base-->Med Rx.  . ANXIETY     takes Xanax nightly  . ASTHMA     Albuterol prn and Advair daily;also takes Prednisone daily  . GERD (gastroesophageal reflux disease)        . Type II diabetes mellitus ([place])        . Essential hypertension        . COPD (chronic obstructive pulmonary disease) ([place])   . O2 dependent     uses oxygen 2 and 1/2 liters per [place] at hs  . SLEEP APNEA, OBSTRUCTIVE  a. doesn't use CPAP  . Peripheral neuropathy ([place])   . Arthritis   . Chronic constipation     takes OTC stool softener  . Diverticulitis   . Glaucoma   . DEPRESSION     takes Zoloft and Doxepin daily  . Anemia     supposed to be taking Vit B but doesn't  . Syncope     a. 03/2015 s/p MDT LINQ.   Past Surgical History  Procedure Laterality Date  . Tonsillectomy    . Acne cyst removal      2 on back   . Cardioversion  04/18/2012    Procedure: CARDIOVERSION;  Surgeon: Fay Records, MD;  Location: [facility];  Service: Cardiovascular;   Laterality: N/A;  . Tee without cardioversion  04/25/2012    Procedure: TRANSESOPHAGEAL ECHOCARDIOGRAM (TEE);  Surgeon: Thayer Headings, MD;  Location: [place];  Service: Cardiovascular;  Laterality: N/A;  . Cardioversion  04/25/2012    Procedure: CARDIOVERSION;  Surgeon: Thayer Headings, MD;  Location: [facility] ENDOSCOPY;  Service: Cardiovascular;  Laterality: N/A;  . Cardioversion  04/25/2012    Procedure: CARDIOVERSION;  Surgeon: Fay Records, MD;  Location: [place];  Service: Cardiovascular;  Laterality: N/A;  . Cardioversion  05/09/2012    Procedure: CARDIOVERSION;  Surgeon: Sherren Mocha, MD;  Location: [place];  Service: Cardiovascular;  Laterality: N/A;  changed from crenshaw to cooper by trish/leone-endo  . Colonoscopy    . Esophagogastroduodenoscopy    . Total knee arthroplasty Right 06/15/2014    Procedure: TOTAL KNEE ARTHROPLASTY;  Surgeon: Renette Butters, MD;  Location: [place];  Service: Orthopedics;  Laterality: Right;  . Colonoscopy with propofol N/A 10/21/2014    Procedure: COLONOSCOPY WITH PROPOFOL;  Surgeon: Ladene Artist, MD;  Location: [facility] ENDOSCOPY;  Service: Endoscopy;  Laterality: N/A;  . Tee without cardioversion N/A 03/15/2015    Procedure: TRANSESOPHAGEAL ECHOCARDIOGRAM (TEE);  Surgeon: Thayer Headings, MD;  Location: [place];  Service: Cardiovascular;  Laterality: N/A;  . Cardioversion N/A 03/15/2015    Procedure: CARDIOVERSION;  Surgeon: Thayer Headings, MD;  Location: [place];  Service: Cardiovascular;  Laterality: N/A;  . Ep implantable device N/A 04/06/2015    Procedure: Loop Recorder Insertion;  Surgeon: Evans Lance, MD;  Location: [place] CV LAB;  Service: Cardiovascular;  Laterality: N/A;   Social History:  reports that he quit smoking about 7 years ago. His smoking use included Cigarettes. He has a 60 pack-year smoking history. He has never used smokeless tobacco. He reports that he does not drink alcohol or use illicit drugs.  Allergies  Allergen  Reactions  . Pravastatin Sodium Other (See Comments)    myalgia  . Simvastatin Other (See Comments)    severe leg cramps  . Statins Other (See Comments)    myalgia  . Tape Other (See Comments)    Skin Tears Use Paper Tape Only    Family History  Problem Relation Age of Onset  . COPD Mother   . Asthma Mother   . Colon polyps Mother   . Allergies Mother   . Hypothyroidism Mother   . Asthma Maternal Grandmother   . Colon cancer Neg Hx      Prior to Admission medications   Medication Sig Start Date End Date Taking? Authorizing Provider  albuterol (PROVENTIL HFA;VENTOLIN HFA) 108 (90 BASE) MCG/ACT inhaler Inhale 2 puffs into the lungs 4 times a day as needed   Yes Historical Provider, MD  ALPRAZolam Duanne Moron) 0.5  MG tablet TAKE 1 TABLET BY MOUTH TWICE A DAY AS NEEDED FOR ANXIETY 08/04/15  Yes [name], MD  carisoprodol (SOMA) 350 MG tablet TAKE 1 TABLET BY MOUTH 3 TIMES A DAY AS NEEDED 08/16/15  Yes [name], MD  clobetasol cream (TEMOVATE) [id number] % Apply 1 application topically daily as needed (itching). 04/26/15  Yes [name], MD  digoxin (LANOXIN) 0.25 MG tablet Take 1 tablet (0.25 mg total) by mouth daily. 06/02/15  Yes Deboraha Sprang, MD  doxepin (SINEQUAN) 25 MG capsule Take 25 mg by mouth 2 (two) times daily.   Yes Historical Provider, MD  ferrous sulfate 325 (65 FE) MG tablet Take 325 mg by mouth daily.   Yes Historical Provider, MD  Fluticasone-Salmeterol (ADVAIR) 250-50 MCG/DOSE AEPB Inhale 1 puff into the lungs 2 (two) times daily. 08/24/15  Yes [name], MD  hydrocortisone 2.5 % cream APPLY TOPICALLY 2 TIMES DAILY. 08/11/15  Yes [name], MD  hydrOXYzine (ATARAX/VISTARIL) 25 MG tablet Take 25 mg by mouth 3 (three) times daily as needed.   Yes Historical Provider, MD  hydrOXYzine (ATARAX/VISTARIL) 25 MG tablet TAKE 1 TABLET BY MOUTH 3 TIMES A DAY FOR ITCHING 08/10/15  Yes [name], MD  ibuprofen (ADVIL,MOTRIN) 200 MG tablet Take 400 mg by mouth at bedtime as  needed for mild pain (pain).  09/15/14  Yes Historical Provider, MD  Linaclotide Rolan Lipa) 145 MCG CAPS capsule Take 145 mcg by mouth daily as needed (Crampy pain).    Yes Historical Provider, MD  Loratadine 10 MG CAPS Take 10 mg by mouth daily.  09/15/14  Yes Historical Provider, MD  methimazole (TAPAZOLE) 10 MG tablet Take 2 tablets (20 mg total) by mouth 2 (two) times daily. 05/24/15  Yes Elayne Snare, MD  nebivolol (BYSTOLIC) 5 MG tablet Take 5 mg by mouth daily.   Yes Historical Provider, MD  omeprazole (PRILOSEC) 20 MG capsule Take 20 mg by mouth daily with supper.    Yes Historical Provider, MD  polyethylene glycol (MIRALAX / GLYCOLAX) packet Take 17 g by mouth daily as needed for mild constipation.   Yes Historical Provider, MD  potassium chloride SA (K-DUR,KLOR-CON) 20 MEQ tablet Take 2 tablets (40 mEq total) by mouth daily. 04/06/15  Yes Amber Sena Slate, NP  sertraline (ZOLOFT) 100 MG tablet Take two tablets (200 mg) by mouth once daily 04/22/15  Yes Historical Provider, MD  tolnaftate (TINACTIN) 1 % powder Apply 1 application topically 2 (two) times daily. Apply to Groin area   Yes Historical Provider, MD  torsemide (DEMADEX) 20 MG tablet Take two tablets (40 mg) by mouth once daily   Yes Historical Provider, MD  traZODone (DESYREL) 100 MG tablet Take 1/2 tablet (50 mg) by mouth once daily   Yes Historical Provider, MD  warfarin (COUMADIN) 5 MG tablet Take 2.5-5 mg by mouth daily. Take 1 tablet on Monday, Wednesday and Friday then take 1/2 tablet all the other days 05/20/15  Yes Historical Provider, MD  witch hazel-glycerin (TUCKS) pad Apply 1 application topically as needed for itching, irritation or hemorrhoids.  09/15/14  Yes Historical Provider, MD  zolpidem (AMBIEN) 10 MG tablet Take 10 mg by mouth at bedtime as needed for sleep.   Yes Historical Provider, MD  ADVAIR DISKUS 250-50 MCG/DOSE AEPB INHALE 1 PUFF EVERY DAY AS DIRECTED Patient not taking: Reported on 08/31/2015 08/26/15   [name],  MD  diltiazem (CARDIZEM CD) 120 MG 24 hr capsule Take  1 capsule (120 mg total) by mouth daily. 09/01/15   Everlene Balls, MD   Physical Exam: Filed Vitals:   09/01/15 [id number] 09/01/15 0045 09/01/15 0204 09/01/15 0241  BP: 101/60 108/70 101/56 110/62  Pulse: 73 84 97 83  Temp:   97.9 F (36.6 C) 98 F (36.7 C)  TempSrc:   Oral Oral  Resp: 16 12 24 20   Height:    5\' 10"  (1.778 m)  Weight:    135.852 kg (299 lb 8 oz)  SpO2: 94% 94% 96% 96%    Wt Readings from Last 3 Encounters:  09/01/15 135.852 kg (299 lb 8 oz)  07/06/15 135.626 kg (299 lb)  06/27/15 138.064 kg (304 lb 6 oz)    General:  Appears calm and comfortable Eyes: PERRL, normal lids, irises & conjunctiva ENT: grossly normal hearing, lips & tongue Neck: no LAD, masses or thyromegaly Cardiovascular: Irregularly irregular, no m/r/g. 2+ LE edema. Telemetry: Atrial fibrillation Respiratory: CTA bilaterally, no w/r/r. Normal respiratory effort. Abdomen: soft, ntnd Skin: no rash or induration seen on limited exam Musculoskeletal: grossly normal tone BUE/BLE Psychiatric: grossly normal mood and affect, speech fluent and appropriate Neurologic: grossly non-focal.          Labs on Admission:  Basic Metabolic Panel:  Recent Labs Lab 08/31/15 2228 09/01/15 0046  NA 138  --   K 3.1*  --   CL 102  --   CO2 25  --   GLUCOSE 92  --   BUN 18  --   CREATININE 1.45*  --   CALCIUM 9.0  --   MG  --  1.9   Liver Function Tests:  Recent Labs Lab 08/31/15 2228  AST 34  ALT 25  ALKPHOS 90  BILITOT 0.6  PROT 7.0  ALBUMIN 3.9   CBC:  Recent Labs Lab 08/31/15 2228  WBC 10.7*  NEUTROABS 7.7  HGB 12.3*  HCT 38.1*  MCV 80.9  PLT 280   Cardiac Enzymes:  Recent Labs Lab 08/31/15 2228  TROPONINI <0.03    BNP (last 3 results)  Recent Labs  03/30/15 1424  BNP 506.3*    Radiological Exams on Admission: Dg Chest 2 View  08/31/2015  CLINICAL DATA:  Acute onset of palpitations.  Initial encounter. EXAM:  CHEST  2 VIEW COMPARISON:  Chest radiograph performed 03/30/2015 FINDINGS: The lungs are well-aerated. Mild vascular congestion is noted, with minimal bibasilar atelectasis. There is no evidence of pleural effusion or pneumothorax. The heart is enlarged.  No acute osseous abnormalities are seen. IMPRESSION: Mild vascular congestion and cardiomegaly, with minimal bibasilar atelectasis. Electronically Signed   By: Garald Balding M.D.   On: 08/31/2015 23:11   Echocardiogram 08/29/2015 History:  PMH:  Syncope. Atrial fibrillation. Non-ischemic cardiomyopathy. Risk factors: RBBB. GERD. Osteoarthritis. Anxiety. Depression. Insomnia. Nephrolithiasis. Anemia. Former tobacco use. Hypertension. Diabetes mellitus. Morbidly obese. Dyslipidemia.  ------------------------------------------------------------------- Study Conclusions  - Procedure narrative: Transthoracic echocardiography. Image quality was suboptimal. The study was technically difficult. Intravenous contrast (Definity) was administered. - Left ventricle: The cavity size was mildly dilated. Wall thickness was normal. Systolic function was moderately to severely reduced. The estimated ejection fraction was in the range of 30% to 35%. Moderate diffuse hypokinesis with no identifiable regional variations. Acoustic contrast opacification revealed no evidence ofthrombus. - Left atrium: The atrium was severely dilated. - Right ventricle: The cavity size was moderately dilated. Systolic function was mildly reduced. - Right atrium: The atrium was severely dilated.  EKG: Independently reviewed.   Assessment/Plan Principal Problem:  Chest pain Admit to telemetry for cardiac monitoring. Serial troponin levels. Cardiology to consult during the morning.  Active Problems:    Atrial fibrillation Continue Nebivolol 5 mg by mouth daily for rate control. Continue Cardizem 120 mg by mouth daily for rate control. Continue  digoxin. Check level.    Anxiety   Depression Continue current combination of antidepressants. Continue hydroxyzine as needed for anxiety.    Obstructive sleep apnea Not on CPAP.    Essential hypertension Thank you current antihypertensive therapy. Monitor blood pressure.    GERD Continue proton pump inhibitor.    Type II diabetes mellitus (HCC) Carb modified diet. Check CBG.    Hyperlipidemia Continue lifestyle modifications. The patient is hypersensitive to statins.    Cardiology was consulted by the emergency department.  Code Status: Full code. DVT Prophylaxis: The patient is on warfarin at home and has a therapeutic INR. Family Communication:  Disposition Plan: Admit to telemetry. Cardiology consult in the morning.  Time spent: Over 70 minutes were spent during the process of this admission.  Reubin Milan [facility] Pager [phone]

## 2015-09-01 NOTE — Progress Notes (Signed)
ANTICOAGULATION CONSULT NOTE - Initial Consult  Pharmacy Consult for Warfarin Indication: atrial fibrillation  Allergies  Allergen Reactions  . Pravastatin Sodium Other (See Comments)    myalgia  . Simvastatin Other (See Comments)    severe leg cramps  . Statins Other (See Comments)    myalgia  . Tape Other (See Comments)    Skin Tears Use Paper Tape Only    Patient Measurements: Height: 5\' 10"  (177.8 cm) Weight: 299 lb 8 oz (135.852 kg) (scale b) IBW/kg (Calculated) : 73  Vital Signs: Temp: 98 F (36.7 C) (11/17 0241) Temp Source: Oral (11/17 0241) BP: 110/62 mmHg (11/17 0241) Pulse Rate: 83 (11/17 0241)  Labs:  Recent Labs  08/30/15 08/31/15 2228  HGB  --  12.3*  HCT  --  38.1*  PLT  --  280  LABPROT  --  24.3*  INR 4.0 2.21*  CREATININE  --  1.45*  TROPONINI  --  <0.03    Estimated Creatinine Clearance: 75.2 mL/min (by C-G formula based on Cr of 1.45).   Medical History: Past Medical History  Diagnosis Date  . DYSKINESIA, ESOPHAGUS   . FIBROMYALGIA   . HYPERLIPIDEMIA     a. Intolerant to statins.  . INSOMNIA     takes Ambien nightly  . Rash and other nonspecific skin eruption 04/12/2009    no cause found saw dermatologists x 2 and allergist  . Paroxysmal atrial fibrillation ([place])     a. CHA2DS2VASc = 3--> takes Coumadin;  b. 03/15/2015 Successful TEE/DCCV;  c. 03/2015 recurrent afib, Amio d/c'd in setting of hyperthyroidism.  . Cardiomyopathy ([place])     a. EF 25% TEE July 2013; b. EF normalized 2015;  c. 03/2015 Echo: EF 40-45%, difrf HK, PASP 38 mmHg, Mild MR, sev LAE/RAE.  [name] Myocardial infarction [facility])     a. 2012 Myoview notable for prior infarct;  b. 03/2015 Lexiscan CL: EF 37%, diff HK, small area of inferior infarct from apex to base-->Med Rx.  . ANXIETY     takes Xanax nightly  . ASTHMA     Albuterol prn and Advair daily;also takes Prednisone daily  . GERD (gastroesophageal reflux disease)        . Type II diabetes mellitus ([place])        .  Essential hypertension        . COPD (chronic obstructive pulmonary disease) ([place])   . O2 dependent     uses oxygen 2 and 1/2 liters per [place] at hs  . SLEEP APNEA, OBSTRUCTIVE     a. doesn't use CPAP  . Peripheral neuropathy ([place])   . Arthritis   . Chronic constipation     takes OTC stool softener  . Diverticulitis   . Glaucoma   . DEPRESSION     takes Zoloft and Doxepin daily  . Anemia     supposed to be taking Vit B but doesn't  . Syncope     a. 03/2015 s/p MDT LINQ.    Medications:  Prescriptions prior to admission  Medication Sig Dispense Refill Last Dose  . albuterol (PROVENTIL HFA;VENTOLIN HFA) 108 (90 BASE) MCG/ACT inhaler Inhale 2 puffs into the lungs 4 times a day as needed   08/30/2015 at Unknown time  . ALPRAZolam (XANAX) 0.5 MG tablet TAKE 1 TABLET BY MOUTH TWICE A DAY AS NEEDED FOR ANXIETY 60 tablet 2 08/30/2015 at Unknown time  . carisoprodol (SOMA) 350 MG tablet TAKE 1 TABLET BY MOUTH 3 TIMES A DAY AS NEEDED  90 tablet 2 08/30/2015 at Unknown time  . clobetasol cream (TEMOVATE) [id number] % Apply 1 application topically daily as needed (itching). 30 g 1 Past Week at Unknown time  . digoxin (LANOXIN) 0.25 MG tablet Take 1 tablet (0.25 mg total) by mouth daily. 30 tablet 3 08/31/2015 at Unknown time  . doxepin (SINEQUAN) 25 MG capsule Take 25 mg by mouth 2 (two) times daily.   08/31/2015 at Unknown time  . ferrous sulfate 325 (65 FE) MG tablet Take 325 mg by mouth daily.   08/31/2015 at Unknown time  . Fluticasone-Salmeterol (ADVAIR) 250-50 MCG/DOSE AEPB Inhale 1 puff into the lungs 2 (two) times daily. 60 each 11 08/30/2015 at Unknown time  . hydrocortisone 2.5 % cream APPLY TOPICALLY 2 TIMES DAILY. 28.35 g 5 08/31/2015 at Unknown time  . hydrOXYzine (ATARAX/VISTARIL) 25 MG tablet Take 25 mg by mouth 3 (three) times daily as needed.   08/31/2015 at Unknown time  . hydrOXYzine (ATARAX/VISTARIL) 25 MG tablet TAKE 1 TABLET BY MOUTH 3 TIMES A DAY FOR ITCHING 30 tablet 1 08/30/2015  at Unknown time  . ibuprofen (ADVIL,MOTRIN) 200 MG tablet Take 400 mg by mouth at bedtime as needed for mild pain (pain).    Past Week at Unknown time  . Linaclotide (LINZESS) 145 MCG CAPS capsule Take 145 mcg by mouth daily as needed (Crampy pain).    Past Week at Unknown time  . Loratadine 10 MG CAPS Take 10 mg by mouth daily.    08/31/2015 at Unknown time  . methimazole (TAPAZOLE) 10 MG tablet Take 2 tablets (20 mg total) by mouth 2 (two) times daily. 120 tablet 1 08/31/2015 at Unknown time  . nebivolol (BYSTOLIC) 5 MG tablet Take 5 mg by mouth daily.   08/30/2015 at Unknown time  . omeprazole (PRILOSEC) 20 MG capsule Take 20 mg by mouth daily with supper.    08/30/2015 at Unknown time  . polyethylene glycol (MIRALAX / GLYCOLAX) packet Take 17 g by mouth daily as needed for mild constipation.   Past Week at Unknown time  . potassium chloride SA (K-DUR,KLOR-CON) 20 MEQ tablet Take 2 tablets (40 mEq total) by mouth daily. 30 tablet 1 08/31/2015 at Unknown time  . sertraline (ZOLOFT) 100 MG tablet Take two tablets (200 mg) by mouth once daily  3 08/31/2015 at Unknown time  . tolnaftate (TINACTIN) 1 % powder Apply 1 application topically 2 (two) times daily. Apply to Groin area   08/30/2015 at Unknown time  . torsemide (DEMADEX) 20 MG tablet Take two tablets (40 mg) by mouth once daily   08/31/2015 at Unknown time  . traZODone (DESYREL) 100 MG tablet Take 1/2 tablet (50 mg) by mouth once daily   08/30/2015 at Unknown time  . warfarin (COUMADIN) 5 MG tablet Take 2.5-5 mg by mouth daily. Take 1 tablet on Monday, Wednesday and Friday then take 1/2 tablet all the other days   08/31/2015 at Unknown time  . witch hazel-glycerin (TUCKS) pad Apply 1 application topically as needed for itching, irritation or hemorrhoids.    Past Week at Unknown time  . zolpidem (AMBIEN) 10 MG tablet Take 10 mg by mouth at bedtime as needed for sleep.   08/30/2015 at Unknown time  . ADVAIR DISKUS 250-50 MCG/DOSE AEPB INHALE 1  PUFF EVERY DAY AS DIRECTED (Patient not taking: Reported on 08/31/2015) 60 each 3 Not Taking at Unknown time    Assessment: 59 y.o. M presents with palpitations. Pt on coumadin PTA for afib. CHA2DS2VASc =  3. Home dose: 5mg  MWF and 2.5mg  TTSS - taken on 11/16. Admit INR 2.21 - therapeutic. CBC stable.  Goal of Therapy:  INR 2-3 Monitor platelets by anticoagulation protocol: Yes   Plan:  Coumadin 5mg  M/W/F and 2.5mg  T/T/S/S Daily INR  Sherlon Handing, PharmD, BCPS Clinical pharmacist, pager [phone] 09/01/2015,2:54 AM

## 2015-09-01 NOTE — Progress Notes (Signed)
Patient has been given pain medicine a few hours ago and continues to complain of pain. Patient states the pain has got a little more worse than before but stil says pain level is a4 or  5. Will notify doctor and continue to monitor patient to end of shift.

## 2015-09-01 NOTE — Telephone Encounter (Signed)
Returned patient's call.  [name] was calling to let me know that [name] followed Dr. Olin Pia instructions and that [name] has been admitted to 3E27.  [name] denies any questions or concerns at this time.

## 2015-09-01 NOTE — Progress Notes (Signed)
Patient seen and examined Complaining of lump in his neck, did have some difficulty swallowing pills Patient states that he also has chest pain radiating into his neck  Patient has a history of atrial fibrillation on amiodarone, 2-D echo on 11/14 showed EF of 30-35%, moderate diffuse hypokinesia  Plan #1 cardiology consult for patient's active chest pain/palpitations, cardiac enzymes negative thus far #2 DC ibuprofen #3 CT of the neck without contrast to further evaluate lump in the throat and speech therapy consultation to assess swallowing #4 history of hypothyroidism therefore check TSH and free T4

## 2015-09-01 NOTE — Discharge Summary (Signed)
Physician Discharge Summary  [name] MRN: [id number] DOB/AGE: [birth year] 60 y.o.  PCP: [name], MD   Admit date: 08/31/2015 Discharge date: 09/01/2015  Discharge Diagnoses:   Principal Problem:   Chest pain Active Problems:   Anxiety   Depression   Obstructive sleep apnea   Essential hypertension   GERD   Persistent atrial fibrillation (HCC)   Type II diabetes mellitus ([place])   Hyperlipidemia    Follow-up recommendations Follow-up with PCP in 3-5 days , including all  additional recommended appointments as below Follow-up CBC, CMP in 3-5 days      Medication List    STOP taking these medications        ibuprofen 200 MG tablet  Commonly known as:  ADVIL,MOTRIN      TAKE these medications        albuterol 108 (90 BASE) MCG/ACT inhaler  Commonly known as:  PROVENTIL HFA;VENTOLIN HFA  Inhale 2 puffs into the lungs 4 times a day as needed     ALPRAZolam 0.5 MG tablet  Commonly known as:  XANAX  TAKE 1 TABLET BY MOUTH TWICE A DAY AS NEEDED FOR ANXIETY     AMBIEN 10 MG tablet  Generic drug:  zolpidem  Take 10 mg by mouth at bedtime as needed for sleep.     carisoprodol 350 MG tablet  Commonly known as:  SOMA  TAKE 1 TABLET BY MOUTH 3 TIMES A DAY AS NEEDED     clobetasol cream 0.05 %  Commonly known as:  TEMOVATE  Apply 1 application topically daily as needed (itching).     digoxin 0.25 MG tablet  Commonly known as:  LANOXIN  Take 1 tablet (0.25 mg total) by mouth daily.     diltiazem 120 MG 24 hr capsule  Commonly known as:  CARDIZEM CD  Take 1 capsule (120 mg total) by mouth daily.     doxepin 25 MG capsule  Commonly known as:  SINEQUAN  Take 25 mg by mouth 2 (two) times daily.     ferrous sulfate 325 (65 FE) MG tablet  Take 325 mg by mouth daily.     Fluticasone-Salmeterol 250-50 MCG/DOSE Aepb  Commonly known as:  ADVAIR  Inhale 1 puff into the lungs 2 (two) times daily.     ADVAIR DISKUS 250-50 MCG/DOSE Aepb  Generic drug:   Fluticasone-Salmeterol  INHALE 1 PUFF EVERY DAY AS DIRECTED     hydrocortisone 2.5 % cream  APPLY TOPICALLY 2 TIMES DAILY.     hydrOXYzine 25 MG tablet  Commonly known as:  ATARAX/VISTARIL  Take 25 mg by mouth 3 (three) times daily as needed.     Linaclotide 145 MCG Caps capsule  Commonly known as:  LINZESS  Take 145 mcg by mouth daily as needed (Crampy pain).     Loratadine 10 MG Caps  Take 10 mg by mouth daily.     methimazole 10 MG tablet  Commonly known as:  TAPAZOLE  Take 2 tablets (20 mg total) by mouth 2 (two) times daily.     nebivolol 5 MG tablet  Commonly known as:  BYSTOLIC  Take 5 mg by mouth daily.     omeprazole 20 MG capsule  Commonly known as:  PRILOSEC  Take 20 mg by mouth daily with supper.     polyethylene glycol packet  Commonly known as:  MIRALAX / GLYCOLAX  Take 17 g by mouth daily as needed for mild constipation.     potassium chloride SA  20 MEQ tablet  Commonly known as:  K-DUR,KLOR-CON  Take 2 tablets (40 mEq total) by mouth daily.     sertraline 100 MG tablet  Commonly known as:  ZOLOFT  Take two tablets (200 mg) by mouth once daily     tolnaftate 1 % powder  Commonly known as:  TINACTIN  Apply 1 application topically 2 (two) times daily. Apply to Groin area     torsemide 20 MG tablet  Commonly known as:  DEMADEX  Take two tablets (40 mg) by mouth once daily     traZODone 100 MG tablet  Commonly known as:  DESYREL  Take 1/2 tablet (50 mg) by mouth once daily     warfarin 5 MG tablet  Commonly known as:  COUMADIN  Take 2.5-5 mg by mouth daily. Take 1 tablet on Monday, Wednesday and Friday then take 1/2 tablet all the other days     witch hazel-glycerin pad  Commonly known as:  TUCKS  Apply 1 application topically as needed for itching, irritation or hemorrhoids.         Discharge Condition:    Discharge Instructions       Discharge Instructions    Diet - low sodium heart healthy    Complete by:  As directed       Increase activity slowly    Complete by:  As directed           Current Discharge Medication List    CONTINUE these medications which have CHANGED   Details  diltiazem (CARDIZEM CD) 120 MG 24 hr capsule Take 1 capsule (120 mg total) by mouth daily. Qty: 30 capsule, Refills: 6      CONTINUE these medications which have NOT CHANGED   Details  albuterol (PROVENTIL HFA;VENTOLIN HFA) 108 (90 BASE) MCG/ACT inhaler Inhale 2 puffs into the lungs 4 times a day as needed    ALPRAZolam (XANAX) 0.5 MG tablet TAKE 1 TABLET BY MOUTH TWICE A DAY AS NEEDED FOR ANXIETY Qty: 60 tablet, Refills: 2    carisoprodol (SOMA) 350 MG tablet TAKE 1 TABLET BY MOUTH 3 TIMES A DAY AS NEEDED Qty: 90 tablet, Refills: 2    clobetasol cream (TEMOVATE) 8.03 % Apply 1 application topically daily as needed (itching). Qty: 30 g, Refills: 1    digoxin (LANOXIN) 0.25 MG tablet Take 1 tablet (0.25 mg total) by mouth daily. Qty: 30 tablet, Refills: 3    doxepin (SINEQUAN) 25 MG capsule Take 25 mg by mouth 2 (two) times daily.    ferrous sulfate 325 (65 FE) MG tablet Take 325 mg by mouth daily.    !! Fluticasone-Salmeterol (ADVAIR) 250-50 MCG/DOSE AEPB Inhale 1 puff into the lungs 2 (two) times daily. Qty: 60 each, Refills: 11    hydrocortisone 2.5 % cream APPLY TOPICALLY 2 TIMES DAILY. Qty: 28.35 g, Refills: 5    hydrOXYzine (ATARAX/VISTARIL) 25 MG tablet Take 25 mg by mouth 3 (three) times daily as needed.    Linaclotide (LINZESS) 145 MCG CAPS capsule Take 145 mcg by mouth daily as needed (Crampy pain).     Loratadine 10 MG CAPS Take 10 mg by mouth daily.     methimazole (TAPAZOLE) 10 MG tablet Take 2 tablets (20 mg total) by mouth 2 (two) times daily. Qty: 120 tablet, Refills: 1    nebivolol (BYSTOLIC) 5 MG tablet Take 5 mg by mouth daily.    omeprazole (PRILOSEC) 20 MG capsule Take 20 mg by mouth daily with supper.  polyethylene glycol (MIRALAX / GLYCOLAX) packet Take 17 g by mouth daily as needed  for mild constipation.    potassium chloride SA (K-DUR,KLOR-CON) 20 MEQ tablet Take 2 tablets (40 mEq total) by mouth daily. Qty: 30 tablet, Refills: 1    sertraline (ZOLOFT) 100 MG tablet Take two tablets (200 mg) by mouth once daily Refills: 3    tolnaftate (TINACTIN) 1 % powder Apply 1 application topically 2 (two) times daily. Apply to Groin area    torsemide (DEMADEX) 20 MG tablet Take two tablets (40 mg) by mouth once daily    traZODone (DESYREL) 100 MG tablet Take 1/2 tablet (50 mg) by mouth once daily    warfarin (COUMADIN) 5 MG tablet Take 2.5-5 mg by mouth daily. Take 1 tablet on Monday, Wednesday and Friday then take 1/2 tablet all the other days    witch hazel-glycerin (TUCKS) pad Apply 1 application topically as needed for itching, irritation or hemorrhoids.     zolpidem (AMBIEN) 10 MG tablet Take 10 mg by mouth at bedtime as needed for sleep.    !! ADVAIR DISKUS 250-50 MCG/DOSE AEPB INHALE 1 PUFF EVERY DAY AS DIRECTED Qty: 60 each, Refills: 3     !! - Potential duplicate medications found. Please discuss with provider.    STOP taking these medications     ibuprofen (ADVIL,MOTRIN) 200 MG tablet        Allergies  Allergen Reactions  . Pravastatin Sodium Other (See Comments)    myalgia  . Simvastatin Other (See Comments)    severe leg cramps  . Statins Other (See Comments)    myalgia  . Amiodarone     hyperthyroidism  . Tape Other (See Comments)    Skin Tears Use Paper Tape Only      Disposition: 01-Home or Self Care   Consults: cardiology    Significant Diagnostic Studies:  Dg Chest 2 View  08/31/2015  CLINICAL DATA:  Acute onset of palpitations.  Initial encounter. EXAM: CHEST  2 VIEW COMPARISON:  Chest radiograph performed 03/30/2015 FINDINGS: The lungs are well-aerated. Mild vascular congestion is noted, with minimal bibasilar atelectasis. There is no evidence of pleural effusion or pneumothorax. The heart is enlarged.  No acute osseous  abnormalities are seen. IMPRESSION: Mild vascular congestion and cardiomegaly, with minimal bibasilar atelectasis. Electronically Signed   By: Garald Balding M.D.   On: 08/31/2015 23:11       Filed Weights   08/31/15 2202 09/01/15 0241  Weight: 140.615 kg (310 lb) 135.852 kg (299 lb 8 oz)     Microbiology: No results found for this or any previous visit (from the past 240 hour(s)).     Blood Culture    Component Value Date/Time   SDES URINE, CLEAN CATCH 06/07/2014 1031   SPECREQUEST NONE 06/07/2014 1031   CULT NO GROWTH Performed at [facility] 06/07/2014 1031   REPTSTATUS 06/08/2014 FINAL 06/07/2014 1031      Labs: Results for orders placed or performed during the hospital encounter of 08/31/15 (from the past 48 hour(s))  CBC with Differential     Status: Abnormal   Collection Time: 08/31/15 10:28 PM  Result Value Ref Range   WBC 10.7 (H) 4.0 - 10.5 K/uL   RBC 4.71 4.22 - 5.81 MIL/uL   Hemoglobin 12.3 (L) 13.0 - 17.0 g/dL   HCT 38.1 (L) 39.0 - 52.0 %   MCV 80.9 78.0 - 100.0 fL   MCH 26.1 26.0 - 34.0 pg   MCHC 32.3 30.0 -  36.0 g/dL   RDW 19.2 (H) 11.5 - 15.5 %   Platelets 280 150 - 400 K/uL   Neutrophils Relative % 72 %   Neutro Abs 7.7 1.7 - 7.7 K/uL   Lymphocytes Relative 19 %   Lymphs Abs 2.0 0.7 - 4.0 K/uL   Monocytes Relative 9 %   Monocytes Absolute 1.0 0.1 - 1.0 K/uL   Eosinophils Relative 0 %   Eosinophils Absolute 0.0 0.0 - 0.7 K/uL   Basophils Relative 0 %   Basophils Absolute 0.0 0.0 - 0.1 K/uL  Comprehensive metabolic panel     Status: Abnormal   Collection Time: 08/31/15 10:28 PM  Result Value Ref Range   Sodium 138 135 - 145 mmol/L   Potassium 3.1 (L) 3.5 - 5.1 mmol/L   Chloride 102 101 - 111 mmol/L   CO2 25 22 - 32 mmol/L   Glucose, Bld 92 65 - 99 mg/dL   BUN 18 6 - 20 mg/dL   Creatinine, Ser 1.45 (H) 0.61 - 1.24 mg/dL   Calcium 9.0 8.9 - 10.3 mg/dL   Total Protein 7.0 6.5 - 8.1 g/dL   Albumin 3.9 3.5 - 5.0 g/dL   AST 34 15 -  41 U/L   ALT 25 17 - 63 U/L   Alkaline Phosphatase 90 38 - 126 U/L   Total Bilirubin 0.6 0.3 - 1.2 mg/dL   GFR calc non Af Amer 51 (L) >60 mL/min   GFR calc Af Amer 59 (L) >60 mL/min    Comment: (NOTE) The eGFR has been calculated using the CKD EPI equation. This calculation has not been validated in all clinical situations. eGFR's persistently <60 mL/min signify possible Chronic Kidney Disease.    Anion gap 11 5 - 15  Troponin I     Status: None   Collection Time: 08/31/15 10:28 PM  Result Value Ref Range   Troponin I <0.03 <0.031 ng/mL    Comment:        NO INDICATION OF MYOCARDIAL INJURY.   Protime-INR     Status: Abnormal   Collection Time: 08/31/15 10:28 PM  Result Value Ref Range   Prothrombin Time 24.3 (H) 11.6 - 15.2 seconds   INR 2.21 (H) 0.00 - 1.49  Magnesium     Status: None   Collection Time: 09/01/15 12:46 AM  Result Value Ref Range   Magnesium 1.9 1.7 - 2.4 mg/dL  Troponin I     Status: None   Collection Time: 09/01/15  4:38 AM  Result Value Ref Range   Troponin I 0.03 <0.031 ng/mL    Comment:        NO INDICATION OF MYOCARDIAL INJURY.   Digoxin level     Status: Abnormal   Collection Time: 09/01/15  4:38 AM  Result Value Ref Range   Digoxin Level <0.2 (L) 0.8 - 2.0 ng/mL  Glucose, capillary     Status: None   Collection Time: 09/01/15  6:42 AM  Result Value Ref Range   Glucose-Capillary 84 65 - 99 mg/dL  Troponin I     Status: None   Collection Time: 09/01/15 10:24 AM  Result Value Ref Range   Troponin I <0.03 <0.031 ng/mL    Comment:        NO INDICATION OF MYOCARDIAL INJURY.   Basic metabolic panel     Status: Abnormal   Collection Time: 09/01/15 10:24 AM  Result Value Ref Range   Sodium 137 135 - 145 mmol/L   Potassium 3.4 (L)  3.5 - 5.1 mmol/L   Chloride 101 101 - 111 mmol/L   CO2 29 22 - 32 mmol/L   Glucose, Bld 81 65 - 99 mg/dL   BUN 15 6 - 20 mg/dL   Creatinine, Ser 1.04 0.61 - 1.24 mg/dL   Calcium 8.9 8.9 - 10.3 mg/dL   GFR calc  non Af Amer >60 >60 mL/min   GFR calc Af Amer >60 >60 mL/min    Comment: (NOTE) The eGFR has been calculated using the CKD EPI equation. This calculation has not been validated in all clinical situations. eGFR's persistently <60 mL/min signify possible Chronic Kidney Disease.    Anion gap 7 5 - 15  Glucose, capillary     Status: None   Collection Time: 09/01/15 11:09 AM  Result Value Ref Range   Glucose-Capillary 94 65 - 99 mg/dL     Lipid Panel     Component Value Date/Time   CHOL 99 03/07/2015 1248   TRIG 73.0 03/07/2015 1248   HDL 30.10* 03/07/2015 1248   CHOLHDL 3 03/07/2015 1248   VLDL 14.6 03/07/2015 1248   LDLCALC 54 03/07/2015 1248   LDLDIRECT 170.2 08/18/2013 1406     Lab Results  Component Value Date   HGBA1C 5.8 03/07/2015   HGBA1C 6.0 02/21/2015   HGBA1C 5.9 06/29/2014     Lab Results  Component Value Date   MICROALBUR 1.4 02/21/2015   LDLCALC 54 03/07/2015   CREATININE 1.04 09/01/2015     HPI :  59 y.o. male with a history of RBBB, persistent atrial fibrillation on coumadin, morbid obesity, HTN, HLD, DMT2, hyperthyroidism on methimazole, fibromyalgia, chronic systolic CHF, OSA intolerant to CPAP, and recent syncope s/p Linq placement who presented to [facility] ED last night (08/31/15) for chest pain.   He was first diagnosed with atrial fibrillation in 2001 and was placed on amiodarone in 2013.Echocardiogram 04/2012 demonstrated EF 20-25% with severe biatrial enlargement. Repeat echo 05/2013 and demonstrated an intercurrent normalization of LV function with biatrial enlargement and mild RV dilatation. He had maintained SR until 11/2014 when he developed palpitations, shortness of breath, and fatigue. He was seen by Dr [name] and underwent cardioversion but had ERAF after 1 day. He was scheduled to be seen in the AF clinic but on the day of appointment had 2 episodes of orthostatic syncope and called EMS. He was then transported to [facility] for further evaluation.  He was found to be in AF withRVR. He underwent myoview scan for syncope with was considered high risk because of decreased EF (30-44%) finding consistent with prior MI w/ small area of inferior wall infarct but no ischemia. Also diffuse HK. Echocardiogram demonstrated EF of 40-45%. Because of hyperthyroidism on labs, amiodarone was discontinued with a plan for rate control. His rate control was titrated and prior to discharge underwent ILR implantation for syncope.During this admission Ranexa was also discontinued.He was discharged on Methimazole and now followed by endocrinology.  Per Dr. Aquilla Hacker last office note, his thyroid is now under control. He scheduled a follow up 2D ECHO which was done on 08/29/15 which revealed EF 30-35%, mild LV dilation, mod diffuse HK with no identifiable regional variations. Severe LA dilation, mod RV dilation, mild RV systolic dysfunction, severe RA dilation.  Per review of records, the patient has chronic dyspnea. He was called by the office last night about a 5 second pause noted on LINQ transmission from 08/29/15. Patient reported that he was asleep and asymptomatic at the time of the episode. He  did mention that he had been having intermittent left chest pressure "for the last couple of weeks" and he was told to go to the ED.   He complains of constant central chest pain that radiates to his back. It is tender to palpation and has been constant for the last couple week. At its worst it is a 4/10. No associated SOB, nausea or diaphoresis. It is not related to exertion. He continues to have active chest pain currently. He has chronic LE edema and always sleeps in a recliner. His dyspnea is no worse than usual   HOSPITAL COURSE:    [name] is a 59 y.o. male with a history of RBBB, persistent atrial fibrillation on coumadin, morbid obesity, HTN, HLD, DMT2, hyperthyroidism on methimazole, fibromyalgia, chronic systolic CHF, OSA intolerant to CPAP, and recent  syncope s/p Linq placement who presented to [facility] ED last night (08/31/15) for chest pain.   Chest pain- chest pain is atypical and reproducible on exam. He is very tender to palpation, radiating to central neck and back -- Troponin neg x3 despite ongoing chest pain. ECG with chronic RBBB and atrial fibrillation so difficult to interpret. Last myoview 03/2015 which negative for ischemia but high risk for low EF. Possible old infarct.  -- With negative enzymes and reassuring recent stress test, cardiology does not feel further ischemic w/up is indicated at this time. He is okay to be discharged home.  Offered CT scan for central neck pain and patient adamantly refused to do this , CT cancelled   Acute on chronic systolic CHF/NICM- most recent 2D ECHO (08/29/15) revealed EF 30-35%, mild LV dilation, mod diffuse HK with no identifiable regional variations. Severe LA dilation, mod RV dilation, mild RV systolic dysfunction, severe RA dilation.  -- He does have some mild pulmonary vascular congestion on CXR, but he feels okay. Will give him a shot of IV lasix and send home on regular demedex dosing. Creat okay.   Persistent atrial fibrillation- restoratation of NSR will be difficult with severely dilated LA. He is closely followed by Dr. [name]. He has an appointment with him next week. They may try to pursue rhythm control now that his thyroid is under control. Continue coumadin.   Previous syncope s/p LINQ- had a 5 second pause which was asymptomatic (he was asleep). Patient known to have bradycardia at night likely due to untreated OSA.   Hyperthyroidism- this has been treated with methimazole and he is closely followed by endocrinology. Wants to go home before repeating thyroid fn tests      Discharge Exam:  Blood pressure 107/58, pulse 60, temperature 98.1 F (36.7 C), temperature source Oral, resp. rate 16, height 5' 10" (1.778 m), weight 135.852 kg (299 lb 8 oz), SpO2 98 %.      General:  Appears calm and comfortable  Eyes: PERRL, normal lids, irises & conjunctiva  ENT: grossly normal hearing, lips & tongue  Neck: no LAD, masses or thyromegaly  Cardiovascular: Irregularly irregular, no m/r/g. 2+ LE edema.  Telemetry: Atrial fibrillation  Respiratory: CTA bilaterally, no w/r/r. Normal respiratory effort.  Abdomen: soft, ntnd  Skin: no rash or induration seen on limited exam  Musculoskeletal: grossly normal tone BUE/BLE  Psychiatric: grossly normal mood and affect, speech fluent and appropriate  Neurologic: grossly non-focal.                   Follow-up Information    Follow up with [name], MD. Schedule an appointment as soon  as possible for a visit in 3 days.   Specialties:  Internal Medicine, Radiology   Contact information:   [address] [place] [address] [phone]       Follow up with Virl Axe, MD. Schedule an appointment as soon as possible for a visit in 1 week.   Specialty:  Cardiology   Contact information:   [place] [id number] [phone]       Signed: Reyne Dumas 09/01/2015, 3:36 PM        Time spent >45 mins

## 2015-09-01 NOTE — Progress Notes (Signed)
Pt is stable, has a complain of throat pain, scheduled for CT neck at some point today, and SLP evaluation is on board, tolerating meds and food well, will continue to monitor the patient.

## 2015-09-01 NOTE — Progress Notes (Signed)
No return page from hospitalist. But pain has subside to a pain level of 2-3 and patient states he is fine. Report given to day nurse and nurse signing off at this time.

## 2015-09-01 NOTE — Progress Notes (Signed)
Patient states pain level is a round a 3 and states he takes Ibuprofen for pain and is has not had any pain medication including nothing when he was in the ED. Notified hospitalist if patient can have something for pain. Kirby,NP instructed nurse to educate patient about taking NSAIDS -ibuprofen while on coumadin. Carried out orders and will monitor patient to end of shift.g

## 2015-09-01 NOTE — Telephone Encounter (Signed)
F/u  Pt stated is retruning call from Raquel Sarna- pt is admitted to [facility]. Please call back and discuss.

## 2015-09-01 NOTE — Progress Notes (Signed)
Please make note that 3E heart failure per floor policy does not take new admit with active chest pain. In report given by the ED nurse, told ED nurse that I would call back to determine if patient was considered in active chest pain for the ED nurse was not sure. Went to clarify with charge nurse per policy and due to admitting doctor's notes/ Cardiology- Patient's chest pressure is associated with angina but because the patient's angina has been going on for about five months, there is a fine line in trying to determine if that was the case. Spoke with rapid response and was instructed to go by our floor policy but as we were discussing the issue, patient was already brought to the floor! Spoke with ED nurse and explained that I needed to clarify this constant chest pressure before coming to the floor. The nurse was very sincere and apologetic and explained she never told  ED tech to bring the patient and that there was a misunderstanding or miscommunication with the ED tech because she did want to release the patient just yet to the floor until nurse called back. Nurse understood. Since the patient was on the floor, care was given right away.

## 2015-09-01 NOTE — Plan of Care (Signed)
Problem: Phase I Progression Outcomes Goal: Aspirin unless contraindicated Outcome: Not Applicable Date Met:  39/12/25 Pt is on coumadin

## 2015-09-01 NOTE — Discharge Summary (Signed)
Pt got discharged, discharge instructions provided and patient showed understanding to it, IV taken out,Telemonitor DC,pt left unit in wheelchair with all of the belongings accompanied with his daughter.

## 2015-09-03 ENCOUNTER — Other Ambulatory Visit: Payer: Self-pay | Admitting: Endocrinology

## 2015-09-03 ENCOUNTER — Encounter: Payer: Self-pay | Admitting: Internal Medicine

## 2015-09-03 IMAGING — CT CT ABD-PELV W/O CM
2 of 4 series · 17 of 46 positions shown, 19 images · non-contrast
Comparison: None.

CLINICAL DATA: Low pelvic pain in the bladder region, episodes of
severe hematuria, right flank pain, history of kidney stones.

EXAM:
CT ABDOMEN AND PELVIS WITHOUT CONTRAST
TECHNIQUE: Multidetector CT imaging of the abdomen and pelvis was performed
following the standard protocol without intravenous contrast.

[Series 2: ap stone study · axial · 0.98mm/px · z∈[-514,-49]mm · 14 of 103 slices shown, 16 images]
[im 5/103  soft-tissue]
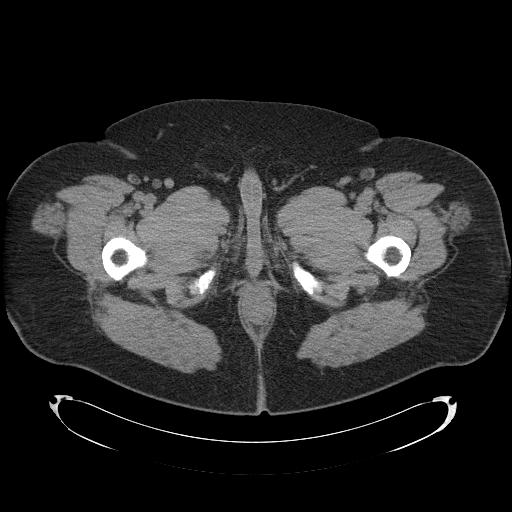
[im 5/103  bone]
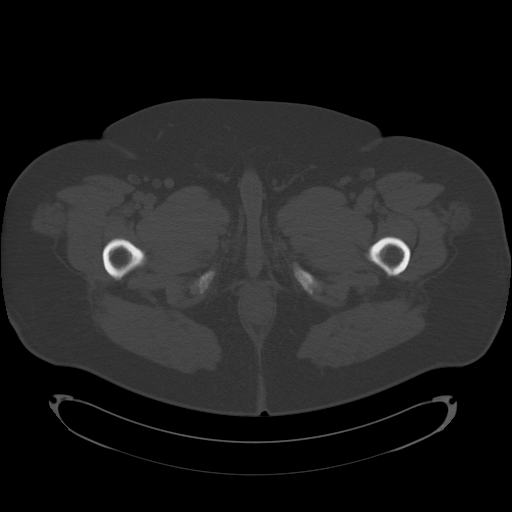
[im 13/103  soft-tissue]
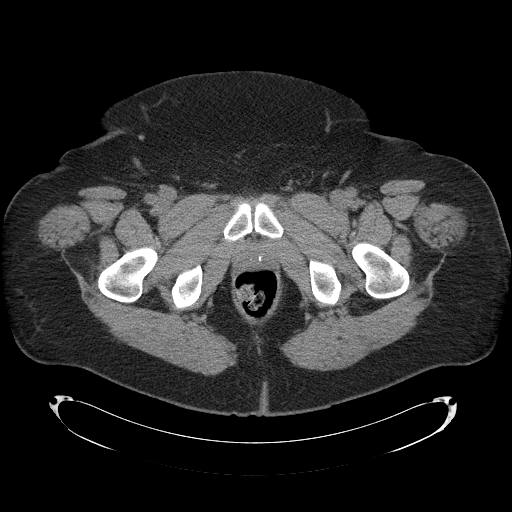
[im 22/103  soft-tissue]
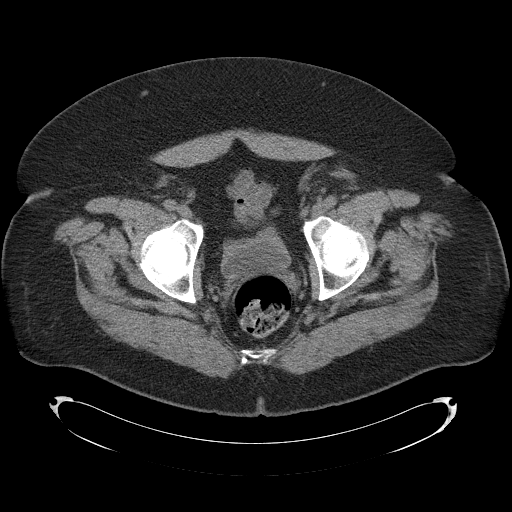
[im 26/103  soft-tissue]
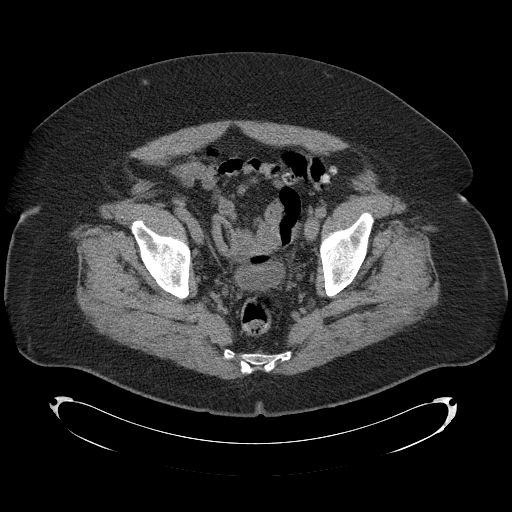
[im 35/103  soft-tissue]
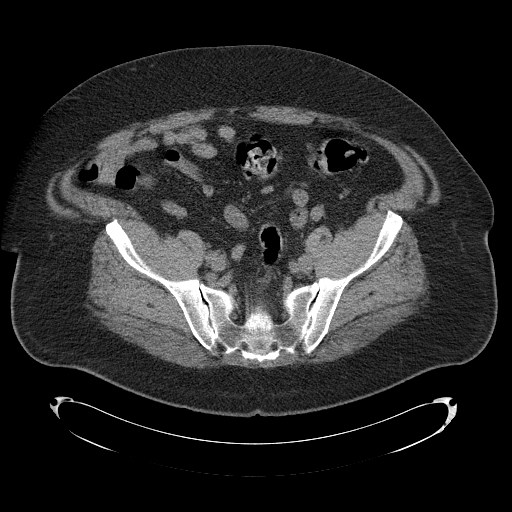
[im 43/103  soft-tissue]
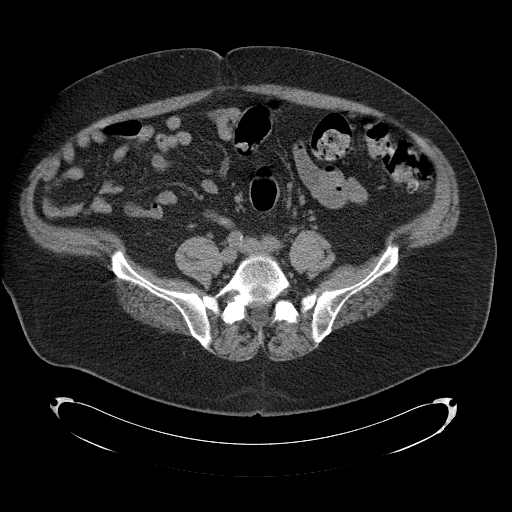
[im 47/103  soft-tissue]
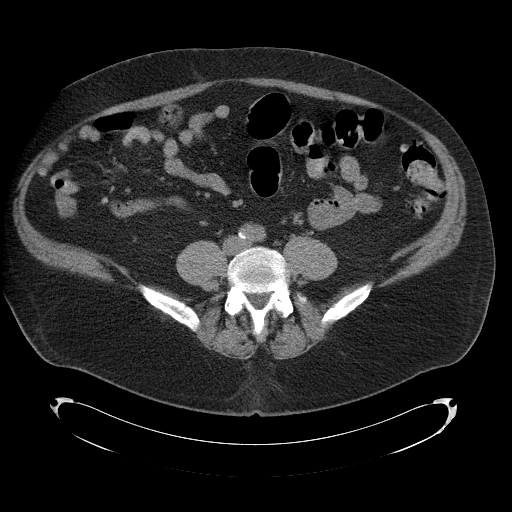
[im 56/103  soft-tissue]
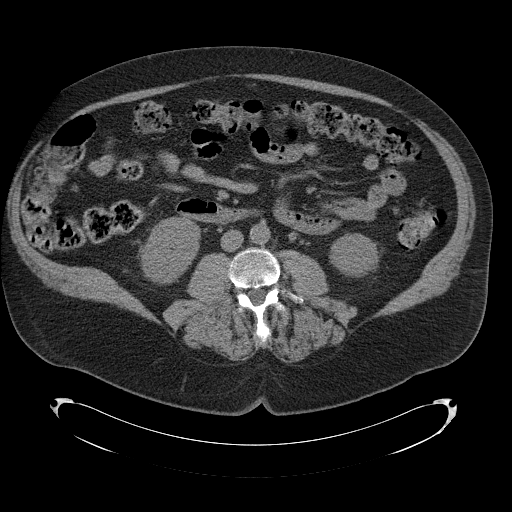
[im 60/103  soft-tissue]
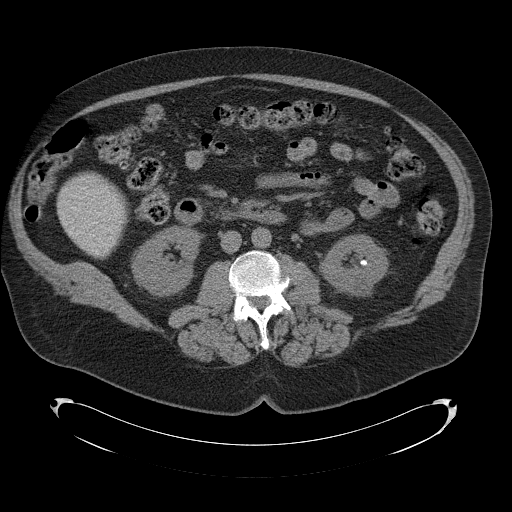
[im 60/103  bone]
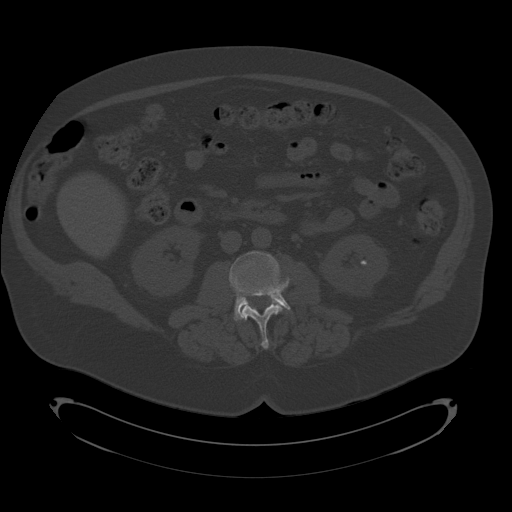
[im 69/103  soft-tissue]
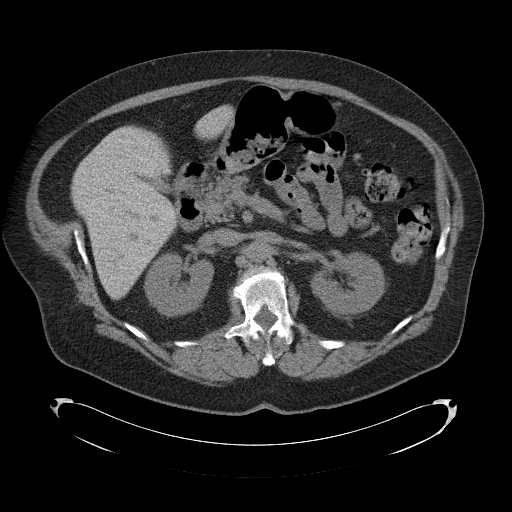
[im 77/103  soft-tissue]
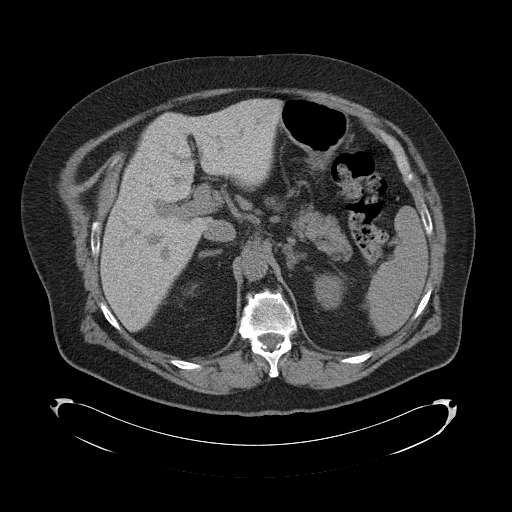
[im 81/103  soft-tissue]
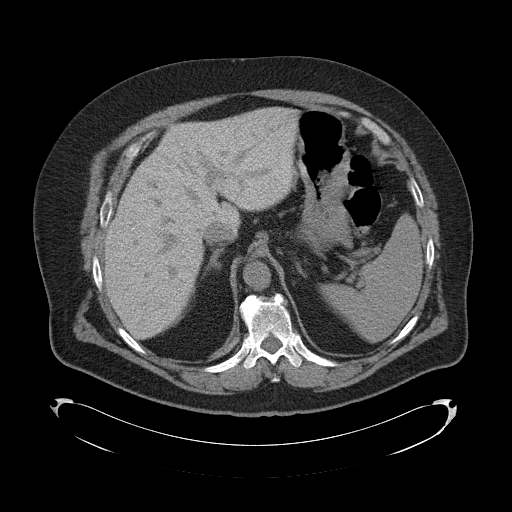
[im 90/103  soft-tissue]
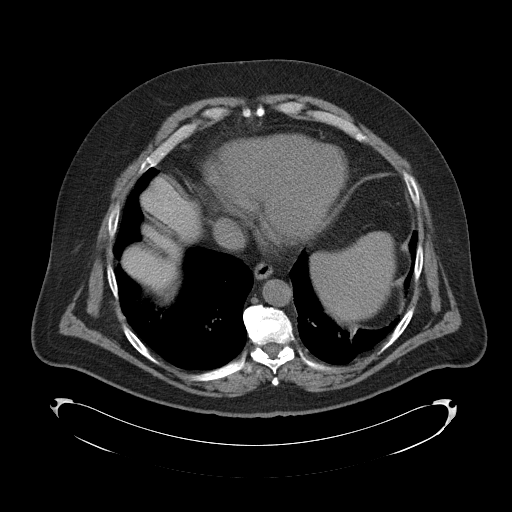
[im 98/103  soft-tissue]
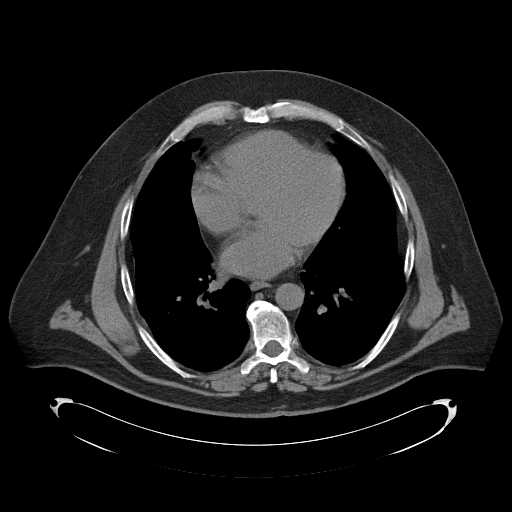

[Series 602: cor · coronal · 1.03mm/px · 3 of 153 slices shown]
[im 51/153  soft-tissue]
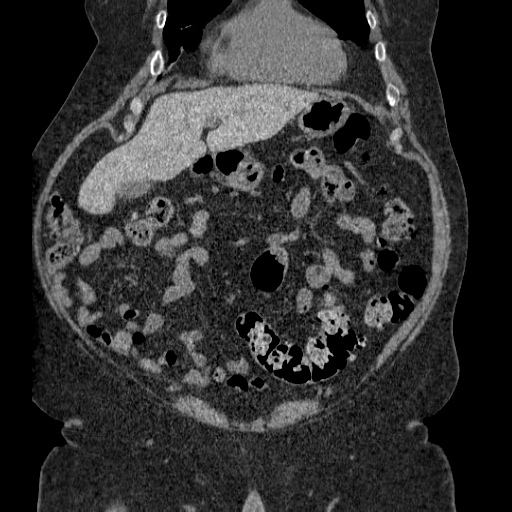
[im 68/153  soft-tissue]
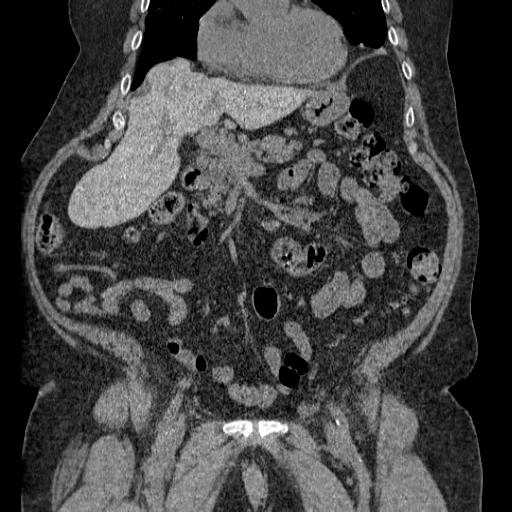
[im 85/153  soft-tissue]
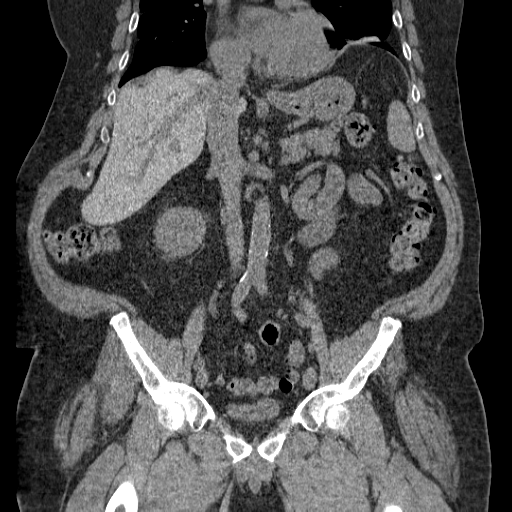

[17 of 46 positions shown; findings below may reference images not displayed]

FINDINGS: There is a triangular airspace opacity at the left lung base likely
representing atelectasis. There is coronary artery atherosclerosis
in the left main, LAD and circumflex coronary artery. There is
minimal atherosclerotic plaque within the right coronary artery.

There are bilateral nonobstructing renal calculi. No obstructive
uropathy. No perinephric stranding is seen. The kidneys are
symmetric in size without evidence for exophytic mass. The bladder
is unremarkable.

The liver demonstrates no focal abnormality. The gallbladder is
unremarkable. The spleen demonstrates no focal abnormality. The
adrenal glands and pancreas are normal.

The unopacified stomach, duodenum, small intestine and large
intestine are unremarkable, but evaluation is limited by lack of
oral contrast. There is diverticulosis without evidence of
diverticulitis. There is a small fat containing umbilical hernia.
There is no pneumoperitoneum, pneumatosis, or portal venous gas.
There is no abdominal or pelvic free fluid. There is no
lymphadenopathy.

The abdominal aorta is normal in caliber with atherosclerosis.

There is degenerative facet arthropathy throughout the lumbar spine.
IMPRESSION: 1. Bilateral nephrolithiasis without obstructive uropathy.

2.  Coronary artery disease.

## 2015-09-05 ENCOUNTER — Ambulatory Visit (INDEPENDENT_AMBULATORY_CARE_PROVIDER_SITE_OTHER): Payer: Commercial Managed Care - HMO | Admitting: Internal Medicine

## 2015-09-05 ENCOUNTER — Encounter: Payer: Self-pay | Admitting: Internal Medicine

## 2015-09-05 ENCOUNTER — Encounter: Payer: Commercial Managed Care - HMO | Admitting: *Deleted

## 2015-09-05 VITALS — BP 110/62 | HR 78 | Ht 70.0 in | Wt 311.4 lb

## 2015-09-05 DIAGNOSIS — I428 Other cardiomyopathies: Secondary | ICD-10-CM

## 2015-09-05 DIAGNOSIS — I429 Cardiomyopathy, unspecified: Secondary | ICD-10-CM | POA: Diagnosis not present

## 2015-09-05 DIAGNOSIS — I4510 Unspecified right bundle-branch block: Secondary | ICD-10-CM | POA: Diagnosis not present

## 2015-09-05 LAB — CUP PACEART INCLINIC DEVICE CHECK: Date Time Interrogation Session: 20161121114248

## 2015-09-05 MED ORDER — SILVER SULFADIAZINE 1 % EX CREA: 1.0000 "application " | TOPICAL_CREAM | Freq: Every day | CUTANEOUS | Status: DC

## 2015-09-05 NOTE — Patient Instructions (Signed)
Medication Instructions: 1) Resume digoxin 0.25 mg one tablet by mouth once daily 2) Increase bystolic to 10 mg one tablet by mouth once daily 3) A prescription will be sent in for Silvadene cream to be applied to your skin tear  Labwork: - none  Procedures/Testing: - none  Follow-Up: - Your physician wants you to follow-up in: 6 months with Dr. [name]. You will receive a reminder letter in the mail two months in advance. If you don't receive a letter, please call our office to schedule the follow-up appointment.  Any Additional Special Instructions Will Be Listed Below (If Applicable). - none

## 2015-09-05 NOTE — Progress Notes (Addendum)
Patient Care Team: [name], MD as PCP - [place], RD as Dietitian   HPI  [name] is a 59 y.o. male   seen in followup for atrial fibrillation for which he is treated with amiodarone.  There was some concern regarding dyspnea so he underwent pulmonary function testing by Dr. Gwenette Greet   Echocardiogram 7/13 demonstrated EF 20-25% with severe biatrial enlargement. Repeat echo 8/14 and demonstrated an intercurrent normalization of LV function with biatrial enlargement and mild RV dilatation    Myoview 2012 demonstrated an ejection fraction of 44% with a prior inferior wall infarct Myoview 2015 demonstrated an ejection fraction 57% with an inferior wall perfusion defect question bowel  DATE TEST     /2012 myoview   EF44 Prior IMI perfsion defect  /2015 myoview   EF57% Perfusion defect--MI vs bowel  2016 myoview 35-40 NO perfusion defect  6/16 Echo 40-45 Severe BAE   Antiarrhythmics Date  dofetilide 2013  amidoarone 2016     We have been trying over recent months to manage rapid rates and to see if we could impact LV function; we have used calcium blockers digoxin and beta blockers; most recently his digoxin had been held as his ejection fraction had improved some.  He had right bundle branch block  6/16 admitted to hospital following episodes of syncope and underwent ILR insertion  He was seen last week because of atypical chest pains.          Past Medical History  Diagnosis Date  . DYSKINESIA, ESOPHAGUS   . FIBROMYALGIA   . HYPERLIPIDEMIA     a. Intolerant to statins.  . INSOMNIA     takes Ambien nightly  . Rash and other nonspecific skin eruption 04/12/2009    no cause found saw dermatologists x 2 and allergist  . Paroxysmal atrial fibrillation ([place])     a. CHA2DS2VASc = 3--> takes Coumadin;  b. 03/15/2015 Successful TEE/DCCV;  c. 03/2015 recurrent afib, Amio d/c'd in setting of hyperthyroidism.  . Cardiomyopathy ([place])     a. EF 25%  TEE July 2013; b. EF normalized 2015;  c. 03/2015 Echo: EF 40-45%, difrf HK, PASP 38 mmHg, Mild MR, sev LAE/RAE.  [name] Myocardial infarction [facility])     a. 2012 Myoview notable for prior infarct;  b. 03/2015 Lexiscan CL: EF 37%, diff HK, small area of inferior infarct from apex to base-->Med Rx.  . ANXIETY     takes Xanax nightly  . ASTHMA     Albuterol prn and Advair daily;also takes Prednisone daily  . GERD (gastroesophageal reflux disease)        . Type II diabetes mellitus ([place])        . Essential hypertension        . COPD (chronic obstructive pulmonary disease) ([place])   . O2 dependent     uses oxygen 2 and 1/2 liters per [place] at hs  . SLEEP APNEA, OBSTRUCTIVE     a. doesn't use CPAP  . Peripheral neuropathy ([place])   . Arthritis   . Chronic constipation     takes OTC stool softener  . Diverticulitis   . Glaucoma   . DEPRESSION     takes Zoloft and Doxepin daily  . Anemia     supposed to be taking Vit B but doesn't  . Syncope     a. 03/2015 s/p MDT LINQ.    Past Surgical History  Procedure Laterality Date  .  Tonsillectomy    . Acne cyst removal      2 on back   . Cardioversion  04/18/2012    Procedure: CARDIOVERSION;  Surgeon: Fay Records, MD;  Location: [place];  Service: Cardiovascular;  Laterality: N/A;  . Tee without cardioversion  04/25/2012    Procedure: TRANSESOPHAGEAL ECHOCARDIOGRAM (TEE);  Surgeon: Thayer Headings, MD;  Location: [place];  Service: Cardiovascular;  Laterality: N/A;  . Cardioversion  04/25/2012    Procedure: CARDIOVERSION;  Surgeon: Thayer Headings, MD;  Location: [facility] ENDOSCOPY;  Service: Cardiovascular;  Laterality: N/A;  . Cardioversion  04/25/2012    Procedure: CARDIOVERSION;  Surgeon: Fay Records, MD;  Location: [place];  Service: Cardiovascular;  Laterality: N/A;  . Cardioversion  05/09/2012    Procedure: CARDIOVERSION;  Surgeon: Sherren Mocha, MD;  Location: [place];  Service: Cardiovascular;  Laterality: N/A;  changed from crenshaw to cooper by  trish/leone-endo  . Colonoscopy    . Esophagogastroduodenoscopy    . Total knee arthroplasty Right 06/15/2014    Procedure: TOTAL KNEE ARTHROPLASTY;  Surgeon: Renette Butters, MD;  Location: [place];  Service: Orthopedics;  Laterality: Right;  . Colonoscopy with propofol N/A 10/21/2014    Procedure: COLONOSCOPY WITH PROPOFOL;  Surgeon: Ladene Artist, MD;  Location: [facility] ENDOSCOPY;  Service: Endoscopy;  Laterality: N/A;  . Tee without cardioversion N/A 03/15/2015    Procedure: TRANSESOPHAGEAL ECHOCARDIOGRAM (TEE);  Surgeon: Thayer Headings, MD;  Location: [place];  Service: Cardiovascular;  Laterality: N/A;  . Cardioversion N/A 03/15/2015    Procedure: CARDIOVERSION;  Surgeon: Thayer Headings, MD;  Location: [place];  Service: Cardiovascular;  Laterality: N/A;  . Ep implantable device N/A 04/06/2015    Procedure: Loop Recorder Insertion;  Surgeon: Evans Lance, MD;  Location: [place] CV LAB;  Service: Cardiovascular;  Laterality: N/A;    Current Outpatient Prescriptions  Medication Sig Dispense Refill  . ADVAIR DISKUS 250-50 MCG/DOSE AEPB INHALE 1 PUFF EVERY DAY AS DIRECTED (Patient not taking: Reported on 08/31/2015) 60 each 3  . albuterol (PROVENTIL HFA;VENTOLIN HFA) 108 (90 BASE) MCG/ACT inhaler Inhale 2 puffs into the lungs 4 times a day as needed    . ALPRAZolam (XANAX) 0.5 MG tablet TAKE 1 TABLET BY MOUTH TWICE A DAY AS NEEDED FOR ANXIETY 60 tablet 2  . carisoprodol (SOMA) 350 MG tablet TAKE 1 TABLET BY MOUTH 3 TIMES A DAY AS NEEDED 90 tablet 2  . clobetasol cream (TEMOVATE) [id number] % Apply 1 application topically daily as needed (itching). 30 g 1  . digoxin (LANOXIN) 0.25 MG tablet Take 1 tablet (0.25 mg total) by mouth daily. 30 tablet 3  . diltiazem (CARDIZEM CD) 120 MG 24 hr capsule Take 1 capsule (120 mg total) by mouth daily. 30 capsule 6  . doxepin (SINEQUAN) 25 MG capsule Take 25 mg by mouth 2 (two) times daily.    . ferrous sulfate 325 (65 FE) MG tablet Take 325 mg by  mouth daily.    . Fluticasone-Salmeterol (ADVAIR) 250-50 MCG/DOSE AEPB Inhale 1 puff into the lungs 2 (two) times daily. 60 each 11  . hydrocortisone 2.5 % cream APPLY TOPICALLY 2 TIMES DAILY. 28.35 g 5  . hydrOXYzine (ATARAX/VISTARIL) 25 MG tablet Take 25 mg by mouth 3 (three) times daily as needed.    . Linaclotide (LINZESS) 145 MCG CAPS capsule Take 145 mcg by mouth daily as needed (Crampy pain).     . Loratadine 10 MG CAPS Take 10 mg by mouth  daily.     . methimazole (TAPAZOLE) 10 MG tablet TAKE 2 TABLETS BY MOUTH TWICE A DAY 120 tablet 0  . nebivolol (BYSTOLIC) 5 MG tablet Take 5 mg by mouth daily.    [name] omeprazole (PRILOSEC) 20 MG capsule Take 20 mg by mouth daily with supper.     . polyethylene glycol (MIRALAX / GLYCOLAX) packet Take 17 g by mouth daily as needed for mild constipation.    . potassium chloride SA (K-DUR,KLOR-CON) 20 MEQ tablet Take 2 tablets (40 mEq total) by mouth daily. 30 tablet 1  . sertraline (ZOLOFT) 100 MG tablet Take two tablets (200 mg) by mouth once daily  3  . tolnaftate (TINACTIN) 1 % powder Apply 1 application topically 2 (two) times daily. Apply to Groin area    . torsemide (DEMADEX) 20 MG tablet Take two tablets (40 mg) by mouth once daily    . traZODone (DESYREL) 100 MG tablet Take 1/2 tablet (50 mg) by mouth once daily    . warfarin (COUMADIN) 5 MG tablet Take 2.5-5 mg by mouth daily. Take 1 tablet on Monday, Wednesday and Friday then take 1/2 tablet all the other days    . witch hazel-glycerin (TUCKS) pad Apply 1 application topically as needed for itching, irritation or hemorrhoids.     [name] zolpidem (AMBIEN) 10 MG tablet Take 10 mg by mouth at bedtime as needed for sleep.    . [DISCONTINUED] atorvastatin (LIPITOR) 20 MG tablet Take 1 tablet (20 mg total) by mouth daily. 30 tablet 11  . [DISCONTINUED] gabapentin (NEURONTIN) 300 MG capsule Take 1 capsule (300 mg total) by mouth 3 (three) times daily. 90 capsule 5   No current facility-administered medications  for this visit.    Allergies  Allergen Reactions  . Pravastatin Sodium Other (See Comments)    myalgia  . Simvastatin Other (See Comments)    severe leg cramps  . Statins Other (See Comments)    myalgia  . Amiodarone     hyperthyroidism  . Tape Other (See Comments)    Skin Tears Use Paper Tape Only    Review of Systems negative except from HPI and PMH  Physical Exam BP 110/62 mmHg  Pulse 78  Ht 5\' 10"  (1.778 m)  Wt 311 lb 6.4 oz (141.25 kg)  BMI 44.68 kg/m2 Well developed andmorbidly obese in no acute distress HENT normal E scleral and icterus clear JVP not discernible Neck Supple Clear to ausculation Irregularly  irregular rate and rhythm, no murmurs gallops or rub Soft with active bowel sounds No clubbing cyanosis  1+Edema Alert and oriented, grossly normal motor and sensory function Affect flat Skin Warm and Dry  ECG from 11/16 was reviewed. There is right bundle left posterior fascicular block and rates ranging on this tracing from 150--75   Assessment and  Plan  Ischemic heart disease with prior MI by Myoview  HFrEF   Cardiomyopathy-ischemic/nonischemic-    Atrial fibrillation persistent/permanent  Hyperthyroidism >>euthyroid   He is euthyroid and with this his HR has improved (averge now in 60s) peaks still fast  Will continue his Cardizem; we will increase his bisoprolol 5--10 and resume his digoxin.  His EF is strill in the 30-35% arange but there has been interval increase agin in HR so hopefully the above adjustments will help  We still have to consider AV ablation   Otherwise we'll continue his current medications.  He had a skin tear from his IV in the hospital the other day we have  prescribed Silvadene and Tegaderm

## 2015-09-05 NOTE — Addendum Note (Signed)
Addended by: Alvis Lemmings C on: 09/05/2015 01:02 PM   Modules accepted: Orders

## 2015-09-05 NOTE — Patient Outreach (Signed)
[facility]) Care Management  09/05/2015  JAXTIN MIHELIC [birth year] [id number]   Telephone outreach to patient, returning the phone call. Patient states he still has not received the medication from [place] that was supposed to been shipped overnight to him last week. I provided the patient with [place] patient assistance program phone number and advised him to call and check the status of the shipment. I also inquired about the emergency medication that was filled at [place] and patient stated he picked that up on 09/02/2015.  Roc Streett L. Mikyah Alamo, [place] Care Management Assistant

## 2015-09-06 ENCOUNTER — Ambulatory Visit (INDEPENDENT_AMBULATORY_CARE_PROVIDER_SITE_OTHER): Payer: Commercial Managed Care - HMO | Admitting: Internal Medicine

## 2015-09-06 ENCOUNTER — Encounter: Payer: Self-pay | Admitting: Internal Medicine

## 2015-09-06 VITALS — BP 124/74 | HR 72 | Temp 98.5°F | Ht 70.0 in | Wt 302.0 lb

## 2015-09-06 DIAGNOSIS — I481 Persistent atrial fibrillation: Secondary | ICD-10-CM | POA: Diagnosis not present

## 2015-09-06 DIAGNOSIS — I4891 Unspecified atrial fibrillation: Secondary | ICD-10-CM

## 2015-09-06 DIAGNOSIS — I4819 Other persistent atrial fibrillation: Secondary | ICD-10-CM

## 2015-09-06 DIAGNOSIS — J439 Emphysema, unspecified: Secondary | ICD-10-CM

## 2015-09-06 DIAGNOSIS — I5033 Acute on chronic diastolic (congestive) heart failure: Secondary | ICD-10-CM

## 2015-09-06 HISTORY — DX: Unspecified atrial fibrillation: I48.91

## 2015-09-06 NOTE — Assessment & Plan Note (Signed)
stable overall by history and exam, recent data reviewed with pt, and pt to continue medical treatment as before,  to f/u any worsening symptoms or concerns Lab Results  Component Value Date   WBC 10.7* 08/31/2015   HGB 12.3* 08/31/2015   HCT 38.1* 08/31/2015   PLT 280 08/31/2015   GLUCOSE 81 09/01/2015   CHOL 99 03/07/2015   TRIG 73.0 03/07/2015   HDL 30.10* 03/07/2015   LDLDIRECT 170.2 08/18/2013   LDLCALC 54 03/07/2015   ALT 25 08/31/2015   AST 34 08/31/2015   NA 137 09/01/2015   K 3.4* 09/01/2015   CL 101 09/01/2015   CREATININE 1.04 09/01/2015   BUN 15 09/01/2015   CO2 29 09/01/2015   TSH 0.011* 04/01/2015   PSA 2.12 02/21/2015   INR 2.21* 08/31/2015   HGBA1C 5.8 03/07/2015   MICROALBUR 1.4 02/21/2015

## 2015-09-06 NOTE — Progress Notes (Signed)
Pre visit review using our clinic review tool, if applicable. No additional management support is needed unless otherwise documented below in the visit note. 

## 2015-09-06 NOTE — Assessment & Plan Note (Signed)
Improved rate, cont same tx, also on coumadin

## 2015-09-06 NOTE — Progress Notes (Signed)
Subjective:    Patient ID: [name], male    DOB: August 24, 1956, 59 y.o.   MRN: [id number]  HPI  Here to f/u; overall doing ok post hospn for CP and afib with RVR, dilt re-started,  Pt denies other chest pain, increasing sob or doe, wheezing, orthopnea, PND, increased LE swelling, palpitations, dizziness or syncope.  Pt denies new neurological symptoms such as new headache, or facial or extremity weakness or numbness  Pt states overall good compliance with meds, mostly trying to follow appropriate diet, with wt overall stable,  but little exercise however. Past Medical History  Diagnosis Date  . DYSKINESIA, ESOPHAGUS   . FIBROMYALGIA   . HYPERLIPIDEMIA     a. Intolerant to statins.  . INSOMNIA     takes Ambien nightly  . Rash and other nonspecific skin eruption 04/12/2009    no cause found saw dermatologists x 2 and allergist  . Paroxysmal atrial fibrillation ([place])     a. CHA2DS2VASc = 3--> takes Coumadin;  b. 03/15/2015 Successful TEE/DCCV;  c. 03/2015 recurrent afib, Amio d/c'd in setting of hyperthyroidism.  . Cardiomyopathy ([place])     a. EF 25% TEE July 2013; b. EF normalized 2015;  c. 03/2015 Echo: EF 40-45%, difrf HK, PASP 38 mmHg, Mild MR, sev LAE/RAE.  [name] Myocardial infarction [facility])     a. 2012 Myoview notable for prior infarct;  b. 03/2015 Lexiscan CL: EF 37%, diff HK, small area of inferior infarct from apex to base-->Med Rx.  . ANXIETY     takes Xanax nightly  . ASTHMA     Albuterol prn and Advair daily;also takes Prednisone daily  . GERD (gastroesophageal reflux disease)        . Type II diabetes mellitus ([place])        . Essential hypertension        . COPD (chronic obstructive pulmonary disease) ([place])   . O2 dependent     uses oxygen 2 and 1/2 liters per [place] at hs  . SLEEP APNEA, OBSTRUCTIVE     a. doesn't use CPAP  . Peripheral neuropathy ([place])   . Arthritis   . Chronic constipation     takes OTC stool softener  . Diverticulitis   . Glaucoma   . DEPRESSION    takes Zoloft and Doxepin daily  . Anemia     supposed to be taking Vit B but doesn't  . Syncope     a. 03/2015 s/p MDT LINQ.   Past Surgical History  Procedure Laterality Date  . Tonsillectomy    . Acne cyst removal      2 on back   . Cardioversion  04/18/2012    Procedure: CARDIOVERSION;  Surgeon: Fay Records, MD;  Location: [place];  Service: Cardiovascular;  Laterality: N/A;  . Tee without cardioversion  04/25/2012    Procedure: TRANSESOPHAGEAL ECHOCARDIOGRAM (TEE);  Surgeon: Thayer Headings, MD;  Location: [place];  Service: Cardiovascular;  Laterality: N/A;  . Cardioversion  04/25/2012    Procedure: CARDIOVERSION;  Surgeon: Thayer Headings, MD;  Location: [facility] ENDOSCOPY;  Service: Cardiovascular;  Laterality: N/A;  . Cardioversion  04/25/2012    Procedure: CARDIOVERSION;  Surgeon: Fay Records, MD;  Location: [place];  Service: Cardiovascular;  Laterality: N/A;  . Cardioversion  05/09/2012    Procedure: CARDIOVERSION;  Surgeon: Sherren Mocha, MD;  Location: [place];  Service: Cardiovascular;  Laterality: N/A;  changed from crenshaw to cooper by trish/leone-endo  . Colonoscopy    .  Esophagogastroduodenoscopy    . Total knee arthroplasty Right 06/15/2014    Procedure: TOTAL KNEE ARTHROPLASTY;  Surgeon: Renette Butters, MD;  Location: [place];  Service: Orthopedics;  Laterality: Right;  . Colonoscopy with propofol N/A 10/21/2014    Procedure: COLONOSCOPY WITH PROPOFOL;  Surgeon: Ladene Artist, MD;  Location: [facility] ENDOSCOPY;  Service: Endoscopy;  Laterality: N/A;  . Tee without cardioversion N/A 03/15/2015    Procedure: TRANSESOPHAGEAL ECHOCARDIOGRAM (TEE);  Surgeon: Thayer Headings, MD;  Location: [place];  Service: Cardiovascular;  Laterality: N/A;  . Cardioversion N/A 03/15/2015    Procedure: CARDIOVERSION;  Surgeon: Thayer Headings, MD;  Location: [place];  Service: Cardiovascular;  Laterality: N/A;  . Ep implantable device N/A 04/06/2015    Procedure: Loop Recorder Insertion;   Surgeon: Evans Lance, MD;  Location: [place] CV LAB;  Service: Cardiovascular;  Laterality: N/A;    reports that he quit smoking about 7 years ago. His smoking use included Cigarettes. He has a 60 pack-year smoking history. He has never used smokeless tobacco. He reports that he does not drink alcohol or use illicit drugs. family history includes Allergies in his mother; Asthma in his maternal grandmother and mother; COPD in his mother; Colon polyps in his mother; Hypothyroidism in his mother. There is no history of Colon cancer. Allergies  Allergen Reactions  . Pravastatin Sodium Other (See Comments)    myalgia  . Simvastatin Other (See Comments)    severe leg cramps  . Statins Other (See Comments)    myalgia  . Amiodarone     hyperthyroidism  . Tape Other (See Comments)    Skin Tears Use Paper Tape Only   Current Outpatient Prescriptions on File Prior to Visit  Medication Sig Dispense Refill  . ADVAIR DISKUS 250-50 MCG/DOSE AEPB INHALE 1 PUFF EVERY DAY AS DIRECTED 60 each 3  . albuterol (PROVENTIL HFA;VENTOLIN HFA) 108 (90 BASE) MCG/ACT inhaler Inhale 2 puffs into the lungs 4 times a day as needed    . ALPRAZolam (XANAX) 0.5 MG tablet TAKE 1 TABLET BY MOUTH TWICE A DAY AS NEEDED FOR ANXIETY 60 tablet 2  . carisoprodol (SOMA) 350 MG tablet TAKE 1 TABLET BY MOUTH 3 TIMES A DAY AS NEEDED 90 tablet 2  . clobetasol cream (TEMOVATE) [id number] % Apply 1 application topically daily as needed (itching). 30 g 1  . digoxin (LANOXIN) 0.25 MG tablet Take 1 tablet (0.25 mg total) by mouth daily. 30 tablet 3  . diltiazem (CARDIZEM CD) 120 MG 24 hr capsule Take 1 capsule (120 mg total) by mouth daily. 30 capsule 6  . doxepin (SINEQUAN) 25 MG capsule Take 25 mg by mouth 2 (two) times daily.    . hydrocortisone 2.5 % cream APPLY TOPICALLY 2 TIMES DAILY. 28.35 g 5  . hydrOXYzine (ATARAX/VISTARIL) 25 MG tablet Take 25 mg by mouth 3 (three) times daily as needed.    . Linaclotide (LINZESS) 145 MCG CAPS  capsule Take 145 mcg by mouth daily as needed (Crampy pain).     . Loratadine 10 MG CAPS Take 10 mg by mouth daily.     . methimazole (TAPAZOLE) 10 MG tablet TAKE 2 TABLETS BY MOUTH TWICE A DAY 120 tablet 0  . nebivolol (BYSTOLIC) 10 MG tablet Take 1 tablet (10 mg total) by mouth daily.    [name] omeprazole (PRILOSEC) 20 MG capsule Take 20 mg by mouth daily with supper.     . polyethylene glycol (MIRALAX / GLYCOLAX) packet Take 17  g by mouth daily as needed for mild constipation.    . potassium chloride SA (K-DUR,KLOR-CON) 20 MEQ tablet Take 2 tablets (40 mEq total) by mouth daily. 30 tablet 1  . sertraline (ZOLOFT) 100 MG tablet Take two tablets (200 mg) by mouth once daily  3  . silver sulfADIAZINE (SILVADENE) 1 % cream Apply 1 application topically daily. 50 g 0  . tolnaftate (TINACTIN) 1 % powder Apply 1 application topically 2 (two) times daily. Apply to Groin area    . torsemide (DEMADEX) 20 MG tablet Take 20 mg by mouth 2 (two) times daily. Take two tablets (40 mg) by mouth once daily    . traZODone (DESYREL) 100 MG tablet Take 1/2 tablet (50 mg) by mouth once daily    . warfarin (COUMADIN) 5 MG tablet Take 2.5-5 mg by mouth daily. Take 1 tablet on Monday, Wednesday and Friday then take 1/2 tablet all the other days    . witch hazel-glycerin (TUCKS) pad Apply 1 application topically as needed for itching, irritation or hemorrhoids.     [name] zolpidem (AMBIEN) 10 MG tablet Take 10 mg by mouth at bedtime as needed for sleep.    . [DISCONTINUED] atorvastatin (LIPITOR) 20 MG tablet Take 1 tablet (20 mg total) by mouth daily. 30 tablet 11  . [DISCONTINUED] gabapentin (NEURONTIN) 300 MG capsule Take 1 capsule (300 mg total) by mouth 3 (three) times daily. 90 capsule 5   No current facility-administered medications on file prior to visit.   Review of Systems  Constitutional: Negative for unusual diaphoresis or night sweats HENT: Negative for ringing in ear or discharge Eyes: Negative for double vision  or worsening visual disturbance.  Respiratory: Negative for choking and stridor.   Gastrointestinal: Negative for vomiting or other signifcant bowel change Genitourinary: Negative for hematuria or change in urine volume.  Musculoskeletal: Negative for other MSK pain or swelling Skin: Negative for color change and worsening wound.  Neurological: Negative for tremors and numbness other than noted  Psychiatric/Behavioral: Negative for decreased concentration or agitation other than above       Objective:   Physical Exam BP 124/74 mmHg  Pulse 72  Temp(Src) 98.5 F (36.9 C) (Oral)  Ht 5\' 10"  (1.778 m)  Wt 302 lb (136.986 kg)  BMI 43.33 kg/m2  SpO2 97% VS noted,  Constitutional: Pt appears in no significant distress HENT: Head: NCAT.  Right Ear: External ear normal.  Left Ear: External ear normal.  Eyes: . Pupils are equal, round, and reactive to light. Conjunctivae and EOM are normal Neck: Normal range of motion. Neck supple.  Cardiovascular: Normal rate and irregular rhythm, rate controlled.   Pulmonary/Chest: Effort normal and breath sounds without rales or wheezing.  Abd:  Soft, NT, ND, + BS Neurological: Pt is alert. Not confused , motor grossly intact Skin: Skin is warm. No rash, no to trace bilat LE edema Psychiatric: Pt behavior is normal. No agitation.     Assessment & Plan:

## 2015-09-06 NOTE — Assessment & Plan Note (Signed)
stable overall by history and exam, recent data reviewed with pt, and pt to continue medical treatment as before,  to f/u any worsening symptoms or concerns SpO2 Readings from Last 3 Encounters:  09/06/15 97%  09/01/15 98%  06/27/15 95%

## 2015-09-06 NOTE — Patient Instructions (Signed)
Please continue all other medications as before, and refills have been done if requested.  Please have the pharmacy call with any other refills you may need.  Please continue your efforts at being more active, low cholesterol diet, and weight control.  Please keep your appointments with your specialists as you may have planned     

## 2015-09-13 DIAGNOSIS — G4733 Obstructive sleep apnea (adult) (pediatric): Secondary | ICD-10-CM | POA: Diagnosis not present

## 2015-09-15 ENCOUNTER — Telehealth: Payer: Self-pay | Admitting: Endocrinology

## 2015-09-15 NOTE — Telephone Encounter (Signed)
LM for pt to call back to schedule f/u

## 2015-09-15 NOTE — Telephone Encounter (Signed)
-----   Message from Elayne Snare, MD sent at 09/03/2015 10:50 AM EST ----- Regarding: Appointment needed Needs to be seen within 30 days with labs for hyperthyroidism, had not made an appointment on his last visit

## 2015-09-23 ENCOUNTER — Other Ambulatory Visit: Payer: Self-pay | Admitting: Endocrinology

## 2015-09-23 ENCOUNTER — Other Ambulatory Visit: Payer: Self-pay | Admitting: Nurse Practitioner

## 2015-09-27 ENCOUNTER — Ambulatory Visit (INDEPENDENT_AMBULATORY_CARE_PROVIDER_SITE_OTHER): Payer: Commercial Managed Care - HMO | Admitting: General Practice

## 2015-09-27 DIAGNOSIS — I4891 Unspecified atrial fibrillation: Secondary | ICD-10-CM

## 2015-09-27 DIAGNOSIS — Z5181 Encounter for therapeutic drug level monitoring: Secondary | ICD-10-CM | POA: Diagnosis not present

## 2015-09-27 LAB — POCT INR: INR: 3.3

## 2015-09-27 NOTE — Progress Notes (Signed)
I have reviewed and agree with the plan. 

## 2015-09-27 NOTE — Progress Notes (Signed)
Pre visit review using our clinic review tool, if applicable. No additional management support is needed unless otherwise documented below in the visit note. 

## 2015-09-30 ENCOUNTER — Other Ambulatory Visit: Payer: Self-pay | Admitting: Endocrinology

## 2015-09-30 NOTE — Telephone Encounter (Signed)
LM for pt to call back to schedule °

## 2015-10-03 ENCOUNTER — Ambulatory Visit (INDEPENDENT_AMBULATORY_CARE_PROVIDER_SITE_OTHER): Payer: Commercial Managed Care - HMO | Admitting: *Deleted

## 2015-10-03 DIAGNOSIS — R55 Syncope and collapse: Secondary | ICD-10-CM

## 2015-10-04 NOTE — Progress Notes (Signed)
Carelink Summary Report / Loop Recorder 

## 2015-10-07 ENCOUNTER — Other Ambulatory Visit: Payer: Self-pay | Admitting: Internal Medicine

## 2015-10-11 NOTE — Telephone Encounter (Signed)
Doxepin done erx

## 2015-10-13 DIAGNOSIS — G4733 Obstructive sleep apnea (adult) (pediatric): Secondary | ICD-10-CM | POA: Diagnosis not present

## 2015-10-19 NOTE — Telephone Encounter (Signed)
ERROR

## 2015-10-20 ENCOUNTER — Telehealth: Payer: Self-pay | Admitting: Internal Medicine

## 2015-10-20 NOTE — Telephone Encounter (Signed)
New Message  Patient c/o Palpitations:  High priority if patient c/o lightheadedness and shortness of breath.  1. How long have you been having Afib ? Within the last few days its gotten worse   2. Are you currently experiencing lightheadedness and shortness of breath? Just a little bit. He states that he already has chronic breathing problems so he cant tell the difference  4. Are you experiencing any other symptoms? He says that he has been in pain but he is not sure if it is arthritis..   Device Questions:   4. Are you calling to see if we received your device transmission? No  Pt request a call back from the device clinic to determine if a signal was received. He wanted to know if there was anything to worry about. Please call.

## 2015-10-20 NOTE — Telephone Encounter (Signed)
Shaquila in device, is aware and triaged this call already, and will follow-up with the pt tomorrow 10/20/15 for device questions.

## 2015-10-22 ENCOUNTER — Other Ambulatory Visit: Payer: Self-pay | Admitting: Internal Medicine

## 2015-10-24 ENCOUNTER — Other Ambulatory Visit: Payer: Commercial Managed Care - HMO

## 2015-10-25 ENCOUNTER — Ambulatory Visit: Payer: Commercial Managed Care - HMO

## 2015-10-27 ENCOUNTER — Ambulatory Visit: Payer: Commercial Managed Care - HMO | Admitting: Endocrinology

## 2015-10-29 ENCOUNTER — Other Ambulatory Visit: Payer: Self-pay | Admitting: Internal Medicine

## 2015-10-31 NOTE — Telephone Encounter (Signed)
Please advise, thanks.

## 2015-10-31 NOTE — Telephone Encounter (Signed)
Done hardcopy to [employer]

## 2015-11-01 ENCOUNTER — Ambulatory Visit (INDEPENDENT_AMBULATORY_CARE_PROVIDER_SITE_OTHER): Payer: Commercial Managed Care - HMO | Admitting: General Practice

## 2015-11-01 ENCOUNTER — Other Ambulatory Visit: Payer: Self-pay | Admitting: Internal Medicine

## 2015-11-01 DIAGNOSIS — Z5181 Encounter for therapeutic drug level monitoring: Secondary | ICD-10-CM

## 2015-11-01 DIAGNOSIS — I4891 Unspecified atrial fibrillation: Secondary | ICD-10-CM

## 2015-11-01 LAB — POCT INR: INR: 1.8

## 2015-11-01 NOTE — Progress Notes (Signed)
Pre visit review using our clinic review tool, if applicable. No additional management support is needed unless otherwise documented below in the visit note. INR is low today.  Boosted patient one day and instructed patient to take the same dosage.

## 2015-11-01 NOTE — Telephone Encounter (Signed)
Xanax already refilled jan 16

## 2015-11-01 NOTE — Progress Notes (Signed)
I have reviewed and agree with the plan. 

## 2015-11-01 NOTE — Telephone Encounter (Signed)
Please advise, thanks.

## 2015-11-02 ENCOUNTER — Other Ambulatory Visit: Payer: Self-pay | Admitting: General Practice

## 2015-11-02 ENCOUNTER — Ambulatory Visit (INDEPENDENT_AMBULATORY_CARE_PROVIDER_SITE_OTHER): Payer: Commercial Managed Care - HMO | Admitting: *Deleted

## 2015-11-02 DIAGNOSIS — R55 Syncope and collapse: Secondary | ICD-10-CM | POA: Diagnosis not present

## 2015-11-02 MED ORDER — WARFARIN SODIUM 5 MG PO TABS: ORAL_TABLET | ORAL | Status: DC

## 2015-11-02 NOTE — Progress Notes (Signed)
Carelink Summary Report / Loop Recorder 

## 2015-11-02 NOTE — Telephone Encounter (Signed)
Rx called into pharmacy, fax was not received per pt and pharmacy.

## 2015-11-10 ENCOUNTER — Other Ambulatory Visit: Payer: Self-pay | Admitting: Internal Medicine

## 2015-11-10 ENCOUNTER — Other Ambulatory Visit: Payer: Self-pay

## 2015-11-10 DIAGNOSIS — I509 Heart failure, unspecified: Secondary | ICD-10-CM

## 2015-11-10 NOTE — Patient Outreach (Signed)
[name] [facility]) Care Management  11/10/2015  MONDELL KOLK Nov 06, 1955 [id number]  Telephone Screen  Referral Date: 11/10/15 Referral Source: [place] Referral Reason:"patient needs medication(Clobetasol cream) purchase assistance and assistance with any financial resources"   Incoming call from patient returning RNCM call. Screening completed.  Social: Patient resides with his dtr and her family. He is independent with his care. Denies any issues with ADLs/IADLs. He drives himself to medical appointments that are short distances. States he does not feel comfortable driving his 60 yr old pickup truck outside of [employer]. Denies any recent falls.   Conditions: Patient has h/o: fibromyalgia, HLD, A-fib, cardiomyopathy, MI, asthma,DM,GERD, HTN and COPD. He voices that he is using oxygen mainly at night only. Patient reports ongoing chronic issues with hemorrhoids. He is not monitoring wgt in the home but reports he does have a scale. Ongoing issues with BLE edema. Patient states that he has taken Amiodorane "too long" which has now caused him thyroid issues. He states he has been referred to a specialist. He also reports an ongoing issues with skin rash(unknown etiology). He needs to see specialist for this as well. Patient reports he is "overwhelmed" with medical stuff.  Medications: He is taking more than 10 meds. He voices having difficulty affording meds. He states that his co-pays on meds keep changing and increasing and he does not understand why. Patient states he is unable to afford last prescription for hemorrhoids due to cost being over $80.00. He states he has tried other meds to help manage issue but none of them worked.  Appointments: Patient has PCP appt on 11/29/15(goes about every 6 months). He needs to make several appointments with specialists for his other medical issues.  Consent: patient familiar with [facility] services as he has had services in the past. Patient  gave verbal consent for [facility].  services.  Plan: RN CM will notify [facility] administrative assistant of case status. RN CM will send [facility] community referral. RN CM will send [place] referral for polypharmacy med review and possible med assistance. RN CM will mail list of transportation resources to patient for [place]. RN CM provided patient with [facility] contact info. RN CM confirmed patient is aware of when to seek medical attention for changes in condition.  Enzo Montgomery, RN,BSN,CCM [place] Management Telephonic Care Management Coordinator Direct Phone: [phone] Toll Free: [phone] Fax: [phone]

## 2015-11-10 NOTE — Patient Outreach (Signed)
[facility]) Care Management  11/10/2015  [name] [birth year] [id number]   Telephone Screen  Referral Date: 11/10/15 Referral Source: [facility] Referral Reason:"patient needs medication(Clobetasol cream) purchase assistance and assistance with any financial resources"  Outreach call # 1 to patient. No answer. RN CM left HIPAA compliant voicemail message for patient.   Plan: RN CM will make outreach attempt to patient within a week.  Enzo Montgomery, RN,BSN,CCM [place] Management Telephonic Care Management Coordinator Direct Phone: [phone] Toll Free: [phone] Fax: [phone]

## 2015-11-11 ENCOUNTER — Other Ambulatory Visit: Payer: Self-pay

## 2015-11-11 NOTE — Patient Outreach (Signed)
New referral on a previous [facility] patient:  [name] is a previous patient discharged from [facility] in August of 2016.  Reviewed screening note.  Placed call to patient who reports that he is struggling with his skin and his medications. Offered home visit and patient has accepted.   PLAN: Home visit planned for 11/15/2015  Tomasa Rand, RN, BSN, [place] Coordinator [phone]

## 2015-11-13 DIAGNOSIS — G4733 Obstructive sleep apnea (adult) (pediatric): Secondary | ICD-10-CM | POA: Diagnosis not present

## 2015-11-15 ENCOUNTER — Other Ambulatory Visit: Payer: Self-pay | Admitting: Internal Medicine

## 2015-11-15 ENCOUNTER — Other Ambulatory Visit: Payer: Self-pay

## 2015-11-15 VITALS — BP 118/72 | HR 62 | Resp 20 | Ht 70.0 in | Wt 328.0 lb

## 2015-11-15 DIAGNOSIS — I5040 Unspecified combined systolic (congestive) and diastolic (congestive) heart failure: Secondary | ICD-10-CM

## 2015-11-15 NOTE — Patient Outreach (Signed)
[facility]) Care Management   11/15/2015  [name] 1956/04/09 [id number]  [name] is an 60 y.o. male 1:00 Arrived for home visit. Patient answered door without difficulty. Subjective:  Patient reports that he is having problems affording his medications, co pays. Reports large amounts of medical debt. Patient reports that he is having increased problems with his skin rash. Reports COPD is fairly stable. Reports that he currently is not weighing due to no battery for his scale. Patient reports occasional hoarseness out of the blue for no reason. Patient reports being out of some of his medications due to cost and needing MD to refill prescriptions. Patient reports that he has stopped going to the pain clinic because oral pain medications were not helping. Reports injections were helping but MD discharged patient because he was not taking oral meds.   Patient reports talking with [facility] case manager on a regular basis about concerns. Reports that [employer] case Freight forwarder provided transportation resources. Patient reports pain and swelling in his right shoulder. Pain increases at night.   Objective:  Awake and alert, very pleasant. Ambulates slowly.  Filed Vitals:   11/15/15 1342  BP: 118/72  Pulse: 62  Resp: 20  Height: 1.778 m (5\' 10" )  Weight: 328 lb (148.78 kg)  SpO2: 96%   Review of Systems  Constitutional: Negative.   HENT:       Reports occasional hoarseness  Eyes:       Reports poor vision  Respiratory: Negative for shortness of breath.   Cardiovascular: Positive for leg swelling.  Gastrointestinal: Positive for diarrhea and constipation.  Genitourinary: Negative.   Musculoskeletal: Positive for joint pain. Negative for falls.       Denies fall in the last 4 weeks.  Skin: Positive for itching and rash.  Neurological: Negative.   Endo/Heme/Allergies: Bruises/bleeds easily.       On coumadin  Psychiatric/Behavioral: Negative.     Physical Exam   Constitutional: He is oriented to person, place, and time. He appears well-developed and well-nourished.  Eyes:  Wearing glasses  Cardiovascular: Normal rate and intact distal pulses.   Irregular rhythm  Respiratory: Effort normal and breath sounds normal.  Lungs clear  GI: Soft. Bowel sounds are normal.  Musculoskeletal: Normal range of motion. He exhibits edema.  1 plus edema to both lower legs,  Right upper arm with trace edema. Able to raise both arms equally  Neurological: He is alert and oriented to person, place, and time.  Skin: Skin is warm and dry. Rash noted.  Bruising noted on both arms.  Small bumps on upper and lower arms. Looks like pimples. Red on outer areas, raised. Itchy per patient.  Psychiatric: He has a normal mood and affect. His behavior is normal. Judgment and thought content normal.    Current Medications:   Current Outpatient Prescriptions  Medication Sig Dispense Refill  . ADVAIR DISKUS 250-50 MCG/DOSE AEPB INHALE 1 PUFF EVERY DAY AS DIRECTED 60 each 3  . albuterol (PROVENTIL HFA;VENTOLIN HFA) 108 (90 BASE) MCG/ACT inhaler Inhale 2 puffs into the lungs 4 times a day as needed    . ALPRAZolam (XANAX) 0.5 MG tablet TAKE 1 TABLET BY MOUTH TWICE A DAY AS NEEDED FOR ANXIETY 60 tablet 2  . BYSTOLIC 5 MG tablet Take 7.5 mg by mouth daily.  6  . carisoprodol (SOMA) 350 MG tablet TAKE 1 TABLET BY MOUTH 3 TIMES A DAY AS NEEDED 90 tablet 2  . diltiazem (CARDIZEM CD) 120  MG 24 hr capsule Take 1 capsule (120 mg total) by mouth daily. 30 capsule 6  . doxepin (SINEQUAN) 25 MG capsule Take 25 mg by mouth 2 (two) times daily.    . hydrocortisone 2.5 % cream APPLY TOPICALLY 2 TIMES DAILY. 28.35 g 5  . ibuprofen (ADVIL,MOTRIN) 200 MG tablet Take 400 mg by mouth 2 (two) times daily.    . Linaclotide (LINZESS) 145 MCG CAPS capsule Take 145 mcg by mouth daily as needed (Crampy pain).     . Loratadine 10 MG CAPS Take 10 mg by mouth daily.     [name] omeprazole (PRILOSEC) 20 MG capsule  Take 20 mg by mouth daily with supper.     . sertraline (ZOLOFT) 100 MG tablet Take two tablets (200 mg) by mouth once daily  3  . silver sulfADIAZINE (SILVADENE) 1 % cream Apply 1 application topically daily. 50 g 0  . tolnaftate (TINACTIN) 1 % powder Apply 1 application topically 2 (two) times daily. Apply to Groin area    . torsemide (DEMADEX) 20 MG tablet Take 20 mg by mouth 2 (two) times daily. Take two tablets (40 mg) by mouth once daily    . traZODone (DESYREL) 100 MG tablet Take 1/2 tablet (50 mg) by mouth once daily    . warfarin (COUMADIN) 5 MG tablet Take as directed by anticoagulation clinic 30 tablet 3  . witch hazel-glycerin (TUCKS) pad Apply 1 application topically as needed for itching, irritation or hemorrhoids.     [name] zolpidem (AMBIEN) 10 MG tablet Take 10 mg by mouth at bedtime as needed for sleep.    . clobetasol cream (TEMOVATE) 0.05 % APPLY TOPICALLY EVERY DAY AS NEEDED FOR ITCHING (Patient not taking: Reported on 11/15/2015) 30 g 1  . digoxin (LANOXIN) 0.25 MG tablet Take 1 tablet (0.25 mg total) by mouth daily. (Patient not taking: Reported on 11/15/2015) 30 tablet 3  . doxepin (SINEQUAN) 25 MG capsule TAKE ONE CAPSULE BY MOUTH TWICE A DAY (Patient not taking: Reported on 11/15/2015) 180 capsule 1  . hydrOXYzine (ATARAX/VISTARIL) 25 MG tablet Take 25 mg by mouth 3 (three) times daily as needed. Reported on 11/15/2015    . KLOR-CON M20 20 MEQ tablet TAKE 2 TABLETS BY MOUTH EVERY DAY (Patient not taking: Reported on 11/15/2015) 30 tablet 11  . methimazole (TAPAZOLE) 10 MG tablet TAKE 2 TABLETS BY MOUTH TWICE A DAY (Patient not taking: Reported on 11/15/2015) 120 tablet 0  . nebivolol (BYSTOLIC) 10 MG tablet Take 10 mg by mouth daily. Reported on 11/15/2015    . polyethylene glycol (MIRALAX / GLYCOLAX) packet Take 17 g by mouth daily as needed for mild constipation. Reported on 11/15/2015    . [DISCONTINUED] atorvastatin (LIPITOR) 20 MG tablet Take 1 tablet (20 mg total) by mouth daily.  30 tablet 11  . [DISCONTINUED] gabapentin (NEURONTIN) 300 MG capsule Take 1 capsule (300 mg total) by mouth 3 (three) times daily. 90 capsule 5   No current facility-administered medications for this visit.    Functional Status:   In your present state of health, do you have any difficulty performing the following activities: 11/15/2015 09/01/2015  Hearing? N N  Vision? Y Y  Difficulty concentrating or making decisions? N N  Walking or climbing stairs? Y Y  Dressing or bathing? N N  Doing errands, shopping? N N  Preparing Food and eating ? N -  Using the Toilet? N -  In the past six months, have you accidently leaked urine? N -  Do you have problems with loss of bowel control? N -  Managing your Medications? N -  Managing your Finances? Y -  Housekeeping or managing your Housekeeping? N -    Fall/Depression Screening:    PHQ 2/9 Scores 11/15/2015 04/25/2015 04/11/2015 02/23/2015 02/16/2014  PHQ - 2 Score 2 0 0 0 1  PHQ- 9 Score 13 - - - -  Exception Documentation - Patient refusal - - -   Fall Risk  11/15/2015 05/10/2015 04/25/2015 04/11/2015 02/23/2015  Falls in the past year? Yes (No Data) Yes Yes No  Number falls in past yr: 2 or more - 2 or more 2 or more -  Injury with Fall? No - No No -  Risk Factor Category  High Fall Risk - High Fall Risk High Fall Risk -  Risk for fall due to : History of fall(s) - History of fall(s) History of fall(s) -  Follow up Falls prevention discussed - Education provided Education provided;Falls prevention discussed -    Assessment:   (1) reviewed [facility] program. Provided new [facility] calendar, my contact card , new magnet and new patient packet including [employer] non discrimination handout.  Discussed consent and patient wishes to make no changes to current consent.  (2) itchy rash to arms. (3) positive depression screening. (4) Right shoulder pain (5) not weighing. No battery for scale. (6) concern about medication cost, missing some meds. (7) financial  concerns  Plan: (1) no changes needed to consent. (2) Will notify MD by sending this note. Encouraged patient to apply creams as prescribed. Requested patient call the doctor for any signs of infection (3) Will send depression screening to MD. However patient is not interested in change of medication or seeing a therapist. (4) Encouraged patient to have shoulder evaluated. (5) Patient reports that he will get a battery by the end of the week and start recording weights.  Reviewed low salt diet. (6) referral to [place] for assistance with medication review and cost. (7) Referral to [place] worker.    Next home visit planned for 12/12/2015. Reviewed Call a nurse phone number with patient.  Collaborative care planning and goal setting during home visit. Primary goal is to start weighing.   [facility] CM Care Plan Problem One        Most Recent Value   Care Plan Problem One  Alteration in management of heart failure   Role Documenting the Problem One  Care Management [place] for Problem One  Active   THN Long Term Goal (31-90 days)  Patient will report no admissions related to heart failure in the next 31 days   THN Long Term Goal Start Date  11/15/15   Interventions for Problem One Long Term Goal  reviewed importance of daily weights, Home visit completed, reviewed written CHF zones in [facility] calendar.Reviewed low salt diets.   THN CM Short Term Goal #1 (0-30 days)  Patient will get a battery for scale in the next 7 days or call nurse if his scale is not working.   THN CM Short Term Goal #1 Start Date  11/15/15   Interventions for Short Term Goal #1  Reviewed importance of daily weights.    THN CM Short Term Goal #2 (0-30 days)  Patient will weigh and record weights daily for the next 30 days.   THN CM Short Term Goal #2 Start Date  11/15/15   Interventions for Short Term Goal #2  Reivewed importance of daily  weights. Discussed heart failure zones     Tomasa Rand, Therapist, sports, Copywriter, advertising,  [facility] [employer] [phone]

## 2015-11-16 ENCOUNTER — Other Ambulatory Visit (INDEPENDENT_AMBULATORY_CARE_PROVIDER_SITE_OTHER): Payer: Commercial Managed Care - HMO

## 2015-11-16 ENCOUNTER — Other Ambulatory Visit: Payer: Self-pay

## 2015-11-16 ENCOUNTER — Telehealth: Payer: Self-pay

## 2015-11-16 ENCOUNTER — Other Ambulatory Visit: Payer: Self-pay | Admitting: Internal Medicine

## 2015-11-16 DIAGNOSIS — E0590 Thyrotoxicosis, unspecified without thyrotoxic crisis or storm: Secondary | ICD-10-CM

## 2015-11-16 LAB — TSH: TSH: 9.16 u[IU]/mL — ABNORMAL HIGH (ref 0.35–4.50)

## 2015-11-16 LAB — T3, FREE: T3, Free: 2.9 pg/mL (ref 2.3–4.2)

## 2015-11-16 MED ORDER — CARISOPRODOL 350 MG PO TABS: 350.0000 mg | ORAL_TABLET | Freq: Three times a day (TID) | ORAL | Status: DC | PRN

## 2015-11-16 NOTE — Telephone Encounter (Signed)
Medication printed signed and faxed

## 2015-11-16 NOTE — Telephone Encounter (Signed)
Done hardcopy to Corinne  

## 2015-11-16 NOTE — Addendum Note (Signed)
Addended by: [name] on: 11/16/2015 12:46 PM   Modules accepted: Orders

## 2015-11-16 NOTE — Telephone Encounter (Signed)
Patient called and stated that his pharmacy has not received his medication refill for soma. He is out of his medication and states that he is unable to get it due to the pharmacy not receiving it. Please advise.

## 2015-11-16 NOTE — Telephone Encounter (Signed)
Please advise patient needs refill on medication.

## 2015-11-17 ENCOUNTER — Other Ambulatory Visit: Payer: Self-pay | Admitting: Licensed Clinical Social Worker

## 2015-11-17 ENCOUNTER — Other Ambulatory Visit: Payer: Self-pay | Admitting: Internal Medicine

## 2015-11-17 LAB — T4, FREE: Free T4: 0.5 ng/dL — ABNORMAL LOW (ref 0.60–1.60)

## 2015-11-17 NOTE — Patient Outreach (Signed)
[facility]) Care Management  11/17/2015  OLUSEGUN DEUTMEYER [birth year] [id number]   Assessment-CSW received referral from [facility] in order to assist patient with mental health and financial problems. CSW completed initial outreach on 11/17/15 but was unable to reach him. CSW left a HIPPA compliant voice message encouraging patient to return call.  Plan-CSW will complete next outreach on 11/18/15.  [name], BSW, MSW, [email or web address] Phone: [phone] Fax: [phone]

## 2015-11-18 ENCOUNTER — Other Ambulatory Visit: Payer: Self-pay | Admitting: Licensed Clinical Social Worker

## 2015-11-18 LAB — CUP PACEART REMOTE DEVICE CHECK: Date Time Interrogation Session: 20161219123541

## 2015-11-18 NOTE — Patient Outreach (Signed)
[facility]) Care Management  11/18/2015  [name] 1955/11/22 [id number]   Assessment-CSW received return call from patient on 11/18/15. HIPPA verifications provided. CSW introduced self, reason for call and of [facility] social work services. Patient is a 60 year old male who resides with his daughter in [place]. Referral made for mental health support and financial assistance.  Transport-Patient reports that his [employer] caseworker has provided a list of transportation resources to him . Patient reports that he is still having issues with arranging transportation to medical appointments at [facility]. Patient unsure if [facility] would transport him to appointments. CSW called [employer] just to clarify and it was confirmed that medical appointments could only be 25 miles or less from residence. [facility] is 35 miles away from patient's home. CSW educated patient on the [employer] bus which would transport him to [place] at a small cost but he would have to get on and off several buses in order to reach destination. Patient is aware of this service and denies interest in program. At this time, patient had a call from his daughter. Patient returned call and stated "I think we can handle the transportation to [place] now." Patient reports that he wished to know all available transportation resources so that he could use his daughter as a last resort. Patient states that daughter is willing to provide this transportation but he at times feels like a burden. CSW educated him on other transportation resources within [facility]. Patient reports that he does not need services at this time as they would not allow him to be transported to [facility]. CSW provided reflective listening and emotional support. Patient denies needing additional transportation services or resources at this time.  Mental Health-Patient reports "I get down on some  days but for the most part I'm handling it really well." Patient reports that he is currently on antidepressants. He expresses no interest in changing these medications. Patient shares that is not interested in receiving therapy at this time. Patient denies [facility] referral. Patient continues to say "I'm doing fine right now." Patient reports having sleeping difficulties at night (problems getting to sleep but not staying asleep.) Patient is currently on medications to alleviate that. Patient reports sleeping 7 to 8 hours per night. Patient admits to rapid thinking anxiety at night which can keep him up. CSW provided coping tools for him to calm the racing thoughts. Patient appreciative of assistance and support.  Patient reports having a strong and stable support network which consist of his church community and family. Patient reports that he is very active in church and that this helps to decrease his stress levels. CSW educated patient on relaxation and meditation methods to alleviate stress. Patient agreeable to CSW mailing out mental health resources in case he were to change his mind.  Financial-Patient is not eligible for Adult Medicaid. Patient reports a monthly income of $1,380. Patient reports that he dealt with a difficult divorce three years ago and had to file bankruptcy. Patient reports that he was homeless during that time and [place] the woods." Patient reports that although things have improved, he has $8,000 of debt in medical bills. Patient has an attorney that he pays $200 per month in order to pay $1,500 in full to attorney so that he can file Chapter 7 bankruptcy. Patient will be unable to file for Chapter 7 until he has paid attorney in full. Patient has  paid $800 dollars as of today. Patient reports that his attorney has been helpful thus far. CSW educated patient on Licensed convey[employer]. Patient states that he has been there twice and is even familiar with their  location. He states that his attorney has already informed him that he will have to use their service. Patient states that he can use their services online or in person but has not decided yet. Patient denied needing information on program as he is familiar with program and receives education from attorney. Patient agreeable to CSW mailing on financial resources for [facility]. Patient reports issues with affording medications. He plans to contact [facility] on 11/21/15 in order to gain mail delivery. RNCM has made [place] referral.   Patient reports that he does not need for CSW to complete home visit as he already is aware of the available resources for transportation, financial and mental health within [facility]. Patient will keep CSW's contact information in case he were to have any questions on concerns in the future.  Plan-CSW has updated RNCM. CSW will send message in the form of in basket to [place] Management Assistant to request that financial and mental health resources be mailed to his residence. CSW will complete social work discharge at this time.  [name], BSW, MSW, [email or web address] Phone: [phone] Fax: [phone]

## 2015-11-18 NOTE — Patient Outreach (Signed)
[name] [facility]) Care Management  11/18/2015  [name] [birth year] [id number]   Assessment-CSW completed initial outreach to patient on 11/17/15. CSW was unable to reach patient but left a HIPPA compliant voice message encouraging patient to return call once available.  Plan-CSW will await for patient to return call or complete next outreach on 11/21/15.  [name], BSW, MSW, [name] Phone: [phone] Fax: [phone]

## 2015-11-21 ENCOUNTER — Ambulatory Visit (INDEPENDENT_AMBULATORY_CARE_PROVIDER_SITE_OTHER): Payer: Commercial Managed Care - HMO | Admitting: Endocrinology

## 2015-11-21 ENCOUNTER — Encounter: Payer: Self-pay | Admitting: Endocrinology

## 2015-11-21 ENCOUNTER — Ambulatory Visit: Payer: Commercial Managed Care - HMO | Admitting: Endocrinology

## 2015-11-21 ENCOUNTER — Other Ambulatory Visit: Payer: Self-pay | Admitting: Pharmacist

## 2015-11-21 VITALS — BP 124/80 | HR 72 | Temp 98.6°F | Resp 16 | Ht 70.0 in | Wt 317.4 lb

## 2015-11-21 DIAGNOSIS — E039 Hypothyroidism, unspecified: Secondary | ICD-10-CM

## 2015-11-21 MED ORDER — LEVOTHYROXINE SODIUM 25 MCG PO TABS: 25.0000 ug | ORAL_TABLET | Freq: Every day | ORAL | Status: DC

## 2015-11-21 NOTE — Patient Outreach (Signed)
[facility]) Care Management  11/21/2015  [name] 28-Jul-1956 [id number]   [name] is a 60yo who was referred to [facility] for medication assistance.  I received return phone call from patient.  Patient reports he has all medications at this time except clobetasol cream.  Discussed options for medication assistance including Extra Help (patient reports he has applied before and did not qualify), patient assistance programs, and use of [employer] mail order pharmacy (Tier 1 medications are zero copay).  Initial home visit scheduled for Tuesday, 11/22/15 at 1:00 PM.     Elisabeth Most, Pharm.D. Pharmacy Resident [place] [phone]

## 2015-11-21 NOTE — Patient Outreach (Signed)
[facility]) Care Management  11/21/2015  JONTY MCGLORY 1955/11/27 [id number]   Request received from [name], LCSW to mail patient mental health and financial resources. Information mailed today, 11/21/15.   Jacqulynn Cadet  [facility] Care Management Assistant

## 2015-11-21 NOTE — Progress Notes (Signed)
Patient ID: [name], male   DOB: 11/13/1955, 60 y.o.   MRN: [id number]                                                                                                               Reason for Appointment:  Hyperthyroidism, follow-up   Referring physician: [name]   History of Present Illness:   Prior to his consultation the patient  had symptoms of palpitations, shakiness, feeling excessively warm and sweaty, and fatigue.  Because of his taking amiodarone for 3 years he has had periodic monitoring of his thyroid levels and was found to be hyperthyroid in 6/16 His amiodarone was stopped in June  He was started on methimazole and the dose was increased to 20 mg twice a day; his free T4 was high at 2.7 in 8/16 He was supposed to come back in 6 weeks for follow-up but he did not do so  He has not had as much problems with fatigue now and no significant heat intolerance, shakiness or palpitations His methimazole was not continued in 1/17 because of his not coming back for follow-up and he has not taken it for at least 4 weeks now  Recently has not been feeling tired but may be getting more colder than usual His weight fluctuates based on his fluid status  His thyroid levels are significantly low now   Wt Readings from Last 3 Encounters:  11/21/15 317 lb 6.4 oz (143.972 kg)  11/15/15 328 lb (148.78 kg)  09/06/15 302 lb (136.986 kg)    Lab Results  Component Value Date   FREET4 0.50* 11/16/2015   FREET4 1.42 06/27/2015   FREET4 2.72* 05/23/2015   TSH 9.16* 11/16/2015   TSH 0.011* 04/01/2015   TSH 0.04* 02/23/2015         Medication List       This list is accurate as of: 11/21/15 11:59 PM.  Always use your most recent med list.               ADVAIR DISKUS 250-50 MCG/DOSE Aepb  Generic drug:  Fluticasone-Salmeterol  INHALE 1 PUFF EVERY DAY AS DIRECTED     albuterol 108 (90 Base) MCG/ACT inhaler  Commonly known as:  PROVENTIL HFA;VENTOLIN HFA  Inhale 2 puffs  into the lungs 4 times a day as needed     ALPRAZolam 0.5 MG tablet  Commonly known as:  XANAX  TAKE 1 TABLET BY MOUTH TWICE A DAY AS NEEDED FOR ANXIETY     nebivolol 5 MG tablet  Commonly known as:  BYSTOLIC  Take 5 mg by mouth daily. Take 1 1/2 tablets daily     BYSTOLIC 5 MG tablet  Generic drug:  nebivolol  Take 7.5 mg by mouth daily.     carisoprodol 350 MG tablet  Commonly known as:  SOMA  Take 1 tablet (350 mg total) by mouth 3 (three) times daily as needed.     clobetasol cream 0.05 %  Commonly known as:  TEMOVATE  APPLY TOPICALLY  EVERY DAY AS NEEDED FOR ITCHING     diltiazem 120 MG 24 hr capsule  Commonly known as:  CARDIZEM CD  Take 1 capsule (120 mg total) by mouth daily.     doxepin 25 MG capsule  Commonly known as:  SINEQUAN  Take 25 mg by mouth 2 (two) times daily.     hydrocortisone 2.5 % cream  APPLY TOPICALLY 2 TIMES DAILY.     hydrOXYzine 25 MG tablet  Commonly known as:  ATARAX/VISTARIL  Take 25 mg by mouth 3 (three) times daily as needed. Reported on 11/15/2015     ibuprofen 200 MG tablet  Commonly known as:  ADVIL,MOTRIN  Take 400 mg by mouth 2 (two) times daily.     KLOR-CON M20 20 MEQ tablet  Generic drug:  potassium chloride SA  TAKE 2 TABLETS BY MOUTH EVERY DAY     levothyroxine 25 MCG tablet  Commonly known as:  SYNTHROID  Take 1 tablet (25 mcg total) by mouth daily before breakfast.     Linaclotide 145 MCG Caps capsule  Commonly known as:  LINZESS  Take 145 mcg by mouth daily as needed (Crampy pain).     Loratadine 10 MG Caps  Take 10 mg by mouth daily.     omeprazole 20 MG capsule  Commonly known as:  PRILOSEC  Take 20 mg by mouth daily with supper.     polyethylene glycol packet  Commonly known as:  MIRALAX / GLYCOLAX  Take 17 g by mouth daily as needed for mild constipation. Reported on 11/15/2015     sertraline 100 MG tablet  Commonly known as:  ZOLOFT  Take two tablets (200 mg) by mouth once daily     silver  sulfADIAZINE 1 % cream  Commonly known as:  SILVADENE  Apply 1 application topically daily.     tolnaftate 1 % powder  Commonly known as:  TINACTIN  Apply 1 application topically 2 (two) times daily. Apply to Groin area     torsemide 20 MG tablet  Commonly known as:  DEMADEX  Take 20 mg by mouth 2 (two) times daily. Take two tablets (40 mg) by mouth once daily     traZODone 100 MG tablet  Commonly known as:  DESYREL  Take 1/2 tablet (50 mg) by mouth once daily     traZODone 100 MG tablet  Commonly known as:  DESYREL  TAKE 1/2 TABLET BY MOUTH AT BEDTIME AS NEEDED     warfarin 5 MG tablet  Commonly known as:  COUMADIN  Take as directed by anticoagulation clinic     witch hazel-glycerin pad  Commonly known as:  TUCKS  Apply 1 application topically as needed for itching, irritation or hemorrhoids.     zolpidem 10 MG tablet  Commonly known as:  AMBIEN  TAKE 1 TABLET BY MOUTH AT BEDTIME            Past Medical History  Diagnosis Date  . DYSKINESIA, ESOPHAGUS   . FIBROMYALGIA   . HYPERLIPIDEMIA     a. Intolerant to statins.  . INSOMNIA     takes Ambien nightly  . Rash and other nonspecific skin eruption 04/12/2009    no cause found saw dermatologists x 2 and allergist  . Paroxysmal atrial fibrillation ([place])     a. CHA2DS2VASc = 3--> takes Coumadin;  b. 03/15/2015 Successful TEE/DCCV;  c. 03/2015 recurrent afib, Amio d/c'd in setting of hyperthyroidism.  . Cardiomyopathy ([place])     a. EF  25% TEE July 2013; b. EF normalized 2015;  c. 03/2015 Echo: EF 40-45%, difrf HK, PASP 38 mmHg, Mild MR, sev LAE/RAE.  [name] Myocardial infarction [facility])     a. 2012 Myoview notable for prior infarct;  b. 03/2015 Lexiscan CL: EF 37%, diff HK, small area of inferior infarct from apex to base-->Med Rx.  . ANXIETY     takes Xanax nightly  . ASTHMA     Albuterol prn and Advair daily;also takes Prednisone daily  . GERD (gastroesophageal reflux disease)        . Type II diabetes mellitus ([place])          . Essential hypertension        . COPD (chronic obstructive pulmonary disease) ([place])   . O2 dependent     uses oxygen 2 and 1/2 liters per [place] at hs  . SLEEP APNEA, OBSTRUCTIVE     a. doesn't use CPAP  . Peripheral neuropathy ([place])   . Arthritis   . Chronic constipation     takes OTC stool softener  . Diverticulitis   . Glaucoma   . DEPRESSION     takes Zoloft and Doxepin daily  . Anemia     supposed to be taking Vit B but doesn't  . Syncope     a. 03/2015 s/p MDT LINQ.  [name] Atrial fibrillation ([place]) 09/06/2015    Past Surgical History  Procedure Laterality Date  . Tonsillectomy    . Acne cyst removal      2 on back   . Cardioversion  04/18/2012    Procedure: CARDIOVERSION;  Surgeon: Fay Records, MD;  Location: [place];  Service: Cardiovascular;  Laterality: N/A;  . Tee without cardioversion  04/25/2012    Procedure: TRANSESOPHAGEAL ECHOCARDIOGRAM (TEE);  Surgeon: Thayer Headings, MD;  Location: [place];  Service: Cardiovascular;  Laterality: N/A;  . Cardioversion  04/25/2012    Procedure: CARDIOVERSION;  Surgeon: Thayer Headings, MD;  Location: [facility] ENDOSCOPY;  Service: Cardiovascular;  Laterality: N/A;  . Cardioversion  04/25/2012    Procedure: CARDIOVERSION;  Surgeon: Fay Records, MD;  Location: [place];  Service: Cardiovascular;  Laterality: N/A;  . Cardioversion  05/09/2012    Procedure: CARDIOVERSION;  Surgeon: Sherren Mocha, MD;  Location: [place];  Service: Cardiovascular;  Laterality: N/A;  changed from crenshaw to cooper by trish/leone-endo  . Colonoscopy    . Esophagogastroduodenoscopy    . Total knee arthroplasty Right 06/15/2014    Procedure: TOTAL KNEE ARTHROPLASTY;  Surgeon: Renette Butters, MD;  Location: [place];  Service: Orthopedics;  Laterality: Right;  . Colonoscopy with propofol N/A 10/21/2014    Procedure: COLONOSCOPY WITH PROPOFOL;  Surgeon: Ladene Artist, MD;  Location: [facility] ENDOSCOPY;  Service: Endoscopy;  Laterality: N/A;  . Tee without cardioversion N/A  03/15/2015    Procedure: TRANSESOPHAGEAL ECHOCARDIOGRAM (TEE);  Surgeon: Thayer Headings, MD;  Location: [place];  Service: Cardiovascular;  Laterality: N/A;  . Cardioversion N/A 03/15/2015    Procedure: CARDIOVERSION;  Surgeon: Thayer Headings, MD;  Location: [place];  Service: Cardiovascular;  Laterality: N/A;  . Ep implantable device N/A 04/06/2015    Procedure: Loop Recorder Insertion;  Surgeon: Evans Lance, MD;  Location: [place] CV LAB;  Service: Cardiovascular;  Laterality: N/A;    Family History  Problem Relation Age of Onset  . COPD Mother   . Asthma Mother   . Colon polyps Mother   . Allergies Mother   . Hypothyroidism Mother   .  Asthma Maternal Grandmother   . Colon cancer Neg Hx     Social History:  reports that he quit smoking about 8 years ago. His smoking use included Cigarettes. He has a 60 pack-year smoking history. He has never used smokeless tobacco. He reports that he does not drink alcohol or use illicit drugs.  Allergies:  Allergies  Allergen Reactions  . Pravastatin Sodium Other (See Comments)    myalgia  . Simvastatin Other (See Comments)    severe leg cramps  . Statins Other (See Comments)    myalgia  . Amiodarone     hyperthyroidism  . Tape Other (See Comments)    Skin Tears Use Paper Tape Only    Review of Systems:  Review of Systems  Cardiovascular: Positive for palpitations and leg swelling.       Atrial fib since 2001       He had been diagnosed to have mild diabetes but blood sugars have been controlled in 2016, has not followed up for this with PCP   Lab Results  Component Value Date   HGBA1C 5.8 03/07/2015     Examination:   BP 124/80 mmHg  Pulse 72  Temp(Src) 98.6 F (37 C)  Resp 16  Ht 5\' 10"  (1.778 m)  Wt 317 lb 6.4 oz (143.972 kg)  BMI 45.54 kg/m2  SpO2 96%   Neck: The thyroid is palpable on swallowing, about 1-1/2 times normal in the right side and barely palpable on the left, slightly firm and  smooth  Neurological: Deep tendon reflexes at biceps are difficult to elicit but appear normal    Assessment/Plan:   Hyperthyroidism from long-term use of amiodarone associated with a multinodular goiter  With his stopping the amiodarone in 6/16 his thyroid levels have come down significantly  However even though he has not been taking methimazole for over a month his thyroid levels are still low with depressed free T4 and high TSH level Difficult to assess his subjective symptoms since he tends to have fatigue or weight changes related to his CHF and diuretics  Since he may subjectively feels better with getting his TSH back to normal will have him try 25 g of levothyroxine for the next 30 days and have him come back for follow-up Emphasized need for regular follow-up  Leanah Kolander 11/22/2015, 10:44 AM

## 2015-11-21 NOTE — Patient Outreach (Signed)
[facility]) Care Management  11/21/2015  [name] 02/21/1956 [id number]   [name] is a 60 yo male who was referred to [facility] for medication assistance and review.  I made initial outreach call to patient to discuss medication assistance.  There was no answer.  I left a HIPAA compliant voicemail for patient to return my call.  I will reach out to patient on Tuesday, 11/22/15 if he does not return my call today.     Elisabeth Most, Pharm.D. Pharmacy Resident [place] [phone]

## 2015-11-22 ENCOUNTER — Other Ambulatory Visit: Payer: Self-pay | Admitting: Pharmacist

## 2015-11-22 ENCOUNTER — Encounter: Payer: Self-pay | Admitting: Pharmacist

## 2015-11-22 ENCOUNTER — Telehealth: Payer: Self-pay | Admitting: Internal Medicine

## 2015-11-22 NOTE — Patient Outreach (Signed)
[facility]) Care Management  [place]   11/22/2015  [name] 1956-10-06 [id number]  Subjective: [name] is a 60yo who was referred to [facility] for medication assistance.  I made initial home visit today.  I reviewed all medications with patient.  Patient reports compliance with all medications except he reports he has not been taking potassium.  Patient reports he ran out of this medication one month ago and did not have refill.    Objective:   Current Medications: Current Outpatient Prescriptions  Medication Sig Dispense Refill  . ADVAIR DISKUS 250-50 MCG/DOSE AEPB INHALE 1 PUFF EVERY DAY AS DIRECTED 60 each 3  . albuterol (PROVENTIL HFA;VENTOLIN HFA) 108 (90 BASE) MCG/ACT inhaler Inhale 2 puffs into the lungs 4 times a day as needed    . ALPRAZolam (XANAX) 0.5 MG tablet TAKE 1 TABLET BY MOUTH TWICE A DAY AS NEEDED FOR ANXIETY 60 tablet 2  . BYSTOLIC 5 MG tablet Take 7.5 mg by mouth daily.  6  . carisoprodol (SOMA) 350 MG tablet Take 1 tablet (350 mg total) by mouth 3 (three) times daily as needed. 90 tablet 2  . diltiazem (CARDIZEM CD) 120 MG 24 hr capsule Take 1 capsule (120 mg total) by mouth daily. 30 capsule 6  . doxepin (SINEQUAN) 25 MG capsule Take 25 mg by mouth 2 (two) times daily.    . hydrocortisone 2.5 % cream APPLY TOPICALLY 2 TIMES DAILY. 28.35 g 5  . hydrOXYzine (ATARAX/VISTARIL) 25 MG tablet Take 25 mg by mouth 3 (three) times daily as needed. Reported on 11/15/2015    . ibuprofen (ADVIL,MOTRIN) 200 MG tablet Take 400 mg by mouth 2 (two) times daily.    . Linaclotide (LINZESS) 145 MCG CAPS capsule Take 145 mcg by mouth daily as needed (Crampy pain).     . Loratadine 10 MG CAPS Take 10 mg by mouth daily.     [name] omeprazole (PRILOSEC) 20 MG capsule Take 20 mg by mouth daily with supper.     . sertraline (ZOLOFT) 100 MG tablet Take two tablets (200 mg) by mouth once daily  3  . silver sulfADIAZINE (SILVADENE) 1 % cream Apply 1  application topically daily. 50 g 0  . tolnaftate (TINACTIN) 1 % powder Apply 1 application topically 2 (two) times daily. Apply to Groin area    . torsemide (DEMADEX) 20 MG tablet Take 20 mg by mouth 2 (two) times daily. Take two tablets (40 mg) by mouth once daily    . traZODone (DESYREL) 100 MG tablet Take 1/2 tablet (50 mg) by mouth once daily    . warfarin (COUMADIN) 5 MG tablet Take as directed by anticoagulation clinic 30 tablet 3  . witch hazel-glycerin (TUCKS) pad Apply 1 application topically as needed for itching, irritation or hemorrhoids.     [name] zolpidem (AMBIEN) 10 MG tablet TAKE 1 TABLET BY MOUTH AT BEDTIME 30 tablet 5  . clobetasol cream (TEMOVATE) 0.05 % APPLY TOPICALLY EVERY DAY AS NEEDED FOR ITCHING (Patient not taking: Reported on 11/22/2015) 30 g 1  . KLOR-CON M20 20 MEQ tablet TAKE 2 TABLETS BY MOUTH EVERY DAY (Patient not taking: Reported on 11/22/2015) 30 tablet 11  . levothyroxine (SYNTHROID) 25 MCG tablet Take 1 tablet (25 mcg total) by mouth daily before breakfast. (Patient not taking: Reported on 11/22/2015) 30 tablet 1  . [DISCONTINUED] atorvastatin (LIPITOR) 20 MG tablet Take 1 tablet (20 mg total) by mouth daily. 30 tablet 11  . [  DISCONTINUED] gabapentin (NEURONTIN) 300 MG capsule Take 1 capsule (300 mg total) by mouth 3 (three) times daily. 90 capsule 5   No current facility-administered medications for this visit.    Functional Status: In your present state of health, do you have any difficulty performing the following activities: 11/15/2015 09/01/2015  Hearing? N N  Vision? Y Y  Difficulty concentrating or making decisions? N N  Walking or climbing stairs? Y Y  Dressing or bathing? N N  Doing errands, shopping? N N  Preparing Food and eating ? N -  Using the Toilet? N -  In the past six months, have you accidently leaked urine? N -  Do you have problems with loss of bowel control? N -  Managing your Medications? N -  Managing your Finances? Y -  Housekeeping or  managing your Housekeeping? N -    Fall/Depression Screening: PHQ 2/9 Scores 11/15/2015 04/25/2015 04/11/2015 02/23/2015 02/16/2014  PHQ - 2 Score 2 0 0 0 1  PHQ- 9 Score 13 - - - -  Exception Documentation - Patient refusal - - -    Assessment: 1.  Medication adherence:  Patient reports compliance with all medications except potassium.  I made call to patient's primary care office to update them that patient has not been taking potassium for approximately one month and to request clarification on whether patient should restart potassium.  If patient is to restart potassium, requested refill of potassium from PCP office for patient.  Patient has primary care visit scheduled for 11/29/15.  In addition, patient was prescribed levothyroxine yesterday and reports he has not started that prescription yet because it was not ready at the pharmacy when he went to pick it up yesterday.  Patient reports he will pick up prescription for levothyroxine today.    Reviewed inhaler medications with patient.  Patient reports he is currently using Advair once daily and sometimes uses a second dose if needed.  Prescription instructions on his box instruct patient to use 1 puff BID.  He also reports having albuterol inhaler on hand if needed.  Reviewed purpose, proper use, and adverse effects of medications.  I counseled patient on difference between controller inhaler (Advair) and rescue inhaler (albuterol).  Discussed importance of using controller inhaler every day as prescribed (1 puff twice daily) even if breathing is good.  Patient voiced understanding.    2.  Medication assistance:  Patient reports difficulty affording medication copays for the following medications: clobetasol cream, Advair, Linzess, and Bystolic.  Patient has [employer] Part D coverage and reports he has a $400 deductible at the beginning of the year.  Per https://hill.biz/, patient has 25% partial extra help.  Patient reports clobetasol cream price increased  this year to $83.  I reviewed medicare.gov with patient and explained to him that $83 is the price he has to pay for clobetasol cream until his deductible is met.  Once he meets the deductible, clobetasol copay will decrease to $12.45 per month.  Patient voices understanding.  Patient was previously approved for Advair patient assistance program in 2016 and received Advair through [facility].  I reviewed qualifications for Advair patient assistance program and patient is not eligible to re-enroll in Advair patient assistance program at this time as $600 must be spent out of pocket on medications before patient would be eligible.  Patient reports he still have 2 and a half months worth of Advair inhalers that he received from [facility].  Reviewed patient assistance programs for Bystolic and Linzess  through [place].  To qualify for [employer] patient assistance program, patient must have applied for and been denied Extra Help through [employer].  Patient currently receives 25% patient extra help.  Discussed patient with representative, Mable, at [facility] patient assistance.  Mable reports patient may still qualify for assistance since he is still having difficulty affording medication copays despite receiving partial Extra Help.  She suggested he submit application to determine if he qualifies.  Assisted patient in completing patient portion of [employer] patient assistance application for Bystolic and Linzess.     Also discussed use of [employer] mail order pharmacy with patient to reduce medication cost (tier 1 and 2 medication would be zero copay).  I reviewed medications with patient and provided patient with a list of medications that would be zero copay through [facility] mail order.     Plan: 1.  Patient to take all medications as prescribed.  Patient reports he will pick up levothyroxine prescription from the pharmacy today.   2.  I will call patient once I receive return call from PCP office regarding potassium.  3.   Patient to attend PCP visit on 11/29/15.  Patient will take list of medications regarding prescriptions that can be filled through [facility] mail order pharmacy for zero copay to PCP visit.   4.  Submitted Extra Help application to see if patient will qualify for more subsidy at this time.  Patient to call me once he receives letter from social security regarding application.  I will make outreach call to patient in 2 weeks if I have not heard from him.   5.  Will send request to [name], [place], for assistance with completion and submission of [employer] patient assistance application.     THN CM Care Plan Problem One        Most Recent Value   Care Plan Problem One  Medication assistance   Role Documenting the Problem One  Clinical Pharmacist   Care Plan for Problem One  Active   THN Long Term Goal (31-90 days)  Patient will explore options to reduce medication cost in the next 31 days including sending prescriptions to [facility] mail order pharmacy and applying for patient assistance programs and Extra Help.    THN Long Term Goal Start Date  11/23/15   Interventions for Problem One Long Term Goal  Discussed options for medication assistance.  Patient will request refills of his medications be sent to [facility] at PCP visit on 11/29/15.  Patient reapplied for Extra Help to determine if he is eligible for more than 25% subsidy at this time.  Also assisted patient in completing [employer] patient assistance form.      Elisabeth Most, Pharm.D. Pharmacy Resident [place] [phone]

## 2015-11-22 NOTE — Telephone Encounter (Signed)
Made home visit today.  Patient reports he has not been taking potassium for a month because he ran out of refills.  Does patient still need to be on this?  If so, patient needs refill.

## 2015-11-25 ENCOUNTER — Other Ambulatory Visit: Payer: Self-pay | Admitting: Internal Medicine

## 2015-11-28 ENCOUNTER — Encounter: Payer: Self-pay | Admitting: Pharmacist

## 2015-11-29 ENCOUNTER — Ambulatory Visit (INDEPENDENT_AMBULATORY_CARE_PROVIDER_SITE_OTHER): Payer: Commercial Managed Care - HMO | Admitting: Internal Medicine

## 2015-11-29 ENCOUNTER — Ambulatory Visit (INDEPENDENT_AMBULATORY_CARE_PROVIDER_SITE_OTHER): Payer: Commercial Managed Care - HMO | Admitting: General Practice

## 2015-11-29 ENCOUNTER — Encounter: Payer: Self-pay | Admitting: Internal Medicine

## 2015-11-29 ENCOUNTER — Other Ambulatory Visit (INDEPENDENT_AMBULATORY_CARE_PROVIDER_SITE_OTHER): Payer: Commercial Managed Care - HMO

## 2015-11-29 VITALS — BP 118/70 | HR 69 | Temp 98.2°F | Ht 70.0 in | Wt 318.5 lb

## 2015-11-29 DIAGNOSIS — E119 Type 2 diabetes mellitus without complications: Secondary | ICD-10-CM | POA: Diagnosis not present

## 2015-11-29 DIAGNOSIS — Z5181 Encounter for therapeutic drug level monitoring: Secondary | ICD-10-CM | POA: Diagnosis not present

## 2015-11-29 DIAGNOSIS — Z0189 Encounter for other specified special examinations: Secondary | ICD-10-CM

## 2015-11-29 DIAGNOSIS — E785 Hyperlipidemia, unspecified: Secondary | ICD-10-CM | POA: Diagnosis not present

## 2015-11-29 DIAGNOSIS — I10 Essential (primary) hypertension: Secondary | ICD-10-CM

## 2015-11-29 DIAGNOSIS — Z0001 Encounter for general adult medical examination with abnormal findings: Secondary | ICD-10-CM | POA: Insufficient documentation

## 2015-11-29 DIAGNOSIS — Z0000 Encounter for general adult medical examination without abnormal findings: Secondary | ICD-10-CM | POA: Diagnosis not present

## 2015-11-29 DIAGNOSIS — R21 Rash and other nonspecific skin eruption: Secondary | ICD-10-CM | POA: Diagnosis not present

## 2015-11-29 LAB — URINALYSIS, ROUTINE W REFLEX MICROSCOPIC
Bilirubin Urine: NEGATIVE
HGB URINE DIPSTICK: NEGATIVE
Ketones, ur: NEGATIVE
Leukocytes, UA: NEGATIVE
NITRITE: NEGATIVE
PH: 6 (ref 5.0–8.0)
SPECIFIC GRAVITY, URINE: 1.02 (ref 1.000–1.030)
TOTAL PROTEIN, URINE-UPE24: NEGATIVE
Urine Glucose: NEGATIVE
Urobilinogen, UA: 0.2 (ref 0.0–1.0)

## 2015-11-29 LAB — HEPATIC FUNCTION PANEL
ALK PHOS: 64 U/L (ref 39–117)
ALT: 18 U/L (ref 0–53)
AST: 25 U/L (ref 0–37)
Albumin: 4.3 g/dL (ref 3.5–5.2)
BILIRUBIN TOTAL: 0.5 mg/dL (ref 0.2–1.2)
Bilirubin, Direct: 0.1 mg/dL (ref 0.0–0.3)
Total Protein: 6.6 g/dL (ref 6.0–8.3)

## 2015-11-29 LAB — HEMOGLOBIN A1C: Hgb A1c MFr Bld: 5.8 % (ref 4.6–6.5)

## 2015-11-29 LAB — CBC WITH DIFFERENTIAL/PLATELET
BASOS PCT: 0.7 % (ref 0.0–3.0)
Basophils Absolute: 0 10*3/uL (ref 0.0–0.1)
EOS PCT: 0.5 % (ref 0.0–5.0)
Eosinophils Absolute: 0 10*3/uL (ref 0.0–0.7)
HCT: 36.3 % — ABNORMAL LOW (ref 39.0–52.0)
Hemoglobin: 12 g/dL — ABNORMAL LOW (ref 13.0–17.0)
LYMPHS ABS: 1.4 10*3/uL (ref 0.7–4.0)
Lymphocytes Relative: 18.2 % (ref 12.0–46.0)
MCHC: 33.1 g/dL (ref 30.0–36.0)
MCV: 85.7 fl (ref 78.0–100.0)
MONO ABS: 0.7 10*3/uL (ref 0.1–1.0)
Monocytes Relative: 9 % (ref 3.0–12.0)
NEUTROS ABS: 5.3 10*3/uL (ref 1.4–7.7)
NEUTROS PCT: 71.6 % (ref 43.0–77.0)
PLATELETS: 174 10*3/uL (ref 150.0–400.0)
RBC: 4.24 Mil/uL (ref 4.22–5.81)
RDW: 20.6 % — AB (ref 11.5–15.5)
WBC: 7.4 10*3/uL (ref 4.0–10.5)

## 2015-11-29 LAB — BASIC METABOLIC PANEL
BUN: 17 mg/dL (ref 6–23)
CHLORIDE: 104 meq/L (ref 96–112)
CO2: 29 mEq/L (ref 19–32)
Calcium: 9 mg/dL (ref 8.4–10.5)
Creatinine, Ser: 0.87 mg/dL (ref 0.40–1.50)
GFR: 94.83 mL/min (ref >60.00)
Glucose, Bld: 79 mg/dL (ref 70–99)
POTASSIUM: 3.9 meq/L (ref 3.5–5.1)
SODIUM: 141 meq/L (ref 135–145)

## 2015-11-29 LAB — POCT INR: INR: 1.8

## 2015-11-29 LAB — LIPID PANEL
CHOL/HDL RATIO: 4
CHOLESTEROL: 154 mg/dL (ref 0–200)
HDL: 37.8 mg/dL — ABNORMAL LOW (ref >39.00)
LDL CALC: 97 mg/dL (ref 0–99)
NonHDL: 116.2
Triglycerides: 96 mg/dL (ref 0.0–149.0)
VLDL: 19.2 mg/dL (ref 0.0–40.0)

## 2015-11-29 LAB — MICROALBUMIN / CREATININE URINE RATIO
CREATININE, U: 218.7 mg/dL
MICROALB UR: 2.1 mg/dL — AB (ref 0.0–1.9)
MICROALB/CREAT RATIO: 1 mg/g (ref 0.0–30.0)

## 2015-11-29 LAB — TSH: TSH: 2.6 u[IU]/mL (ref 0.35–4.50)

## 2015-11-29 LAB — PSA: PSA: 1.93 ng/mL (ref 0.10–4.00)

## 2015-11-29 MED ORDER — TORSEMIDE 20 MG PO TABS: 40.0000 mg | ORAL_TABLET | Freq: Two times a day (BID) | ORAL | Status: DC

## 2015-11-29 MED ORDER — DOXEPIN HCL 25 MG PO CAPS: 25.0000 mg | ORAL_CAPSULE | Freq: Two times a day (BID) | ORAL | Status: DC

## 2015-11-29 MED ORDER — HYDROCORTISONE 2.5 % EX CREA: TOPICAL_CREAM | Freq: Two times a day (BID) | CUTANEOUS | Status: DC | PRN

## 2015-11-29 MED ORDER — SERTRALINE HCL 100 MG PO TABS: 100.0000 mg | ORAL_TABLET | Freq: Every day | ORAL | Status: DC

## 2015-11-29 MED ORDER — NEBIVOLOL HCL 5 MG PO TABS: 7.5000 mg | ORAL_TABLET | Freq: Every day | ORAL | Status: DC

## 2015-11-29 MED ORDER — FLUOCINONIDE 0.05 % EX CREA: 1.0000 "application " | TOPICAL_CREAM | Freq: Two times a day (BID) | CUTANEOUS | Status: DC

## 2015-11-29 MED ORDER — TRIAMCINOLONE ACETONIDE 0.1 % EX CREA: 1.0000 "application " | TOPICAL_CREAM | Freq: Two times a day (BID) | CUTANEOUS | Status: DC

## 2015-11-29 MED ORDER — DILTIAZEM HCL ER COATED BEADS 120 MG PO CP24: 120.0000 mg | ORAL_CAPSULE | Freq: Every day | ORAL | Status: DC

## 2015-11-29 MED ORDER — OMEPRAZOLE 20 MG PO CPDR: 20.0000 mg | DELAYED_RELEASE_CAPSULE | Freq: Every day | ORAL | Status: DC

## 2015-11-29 NOTE — Assessment & Plan Note (Signed)
stable overall by history and exam, recent data reviewed with pt, and pt to continue medical treatment as before,  to f/u any worsening symptoms or concerns Lab Results  Component Value Date   HGBA1C 5.8 03/07/2015    

## 2015-11-29 NOTE — Progress Notes (Signed)
Subjective:    Patient ID: [name], male    DOB: 13-Mar-1956, 60 y.o.   MRN: [id number]  HPI  Here for wellness and f/u;  Overall doing ok;  Pt denies Chest pain, worsening SOB, DOE, wheezing, orthopnea, PND, worsening LE edema, palpitations, dizziness or syncope.  Pt denies neurological change such as new headache, facial or extremity weakness.  Pt denies polydipsia, polyuria, or low sugar symptoms. Pt states overall good compliance with treatment and medications, good tolerability, and has been trying to follow appropriate diet.  Pt denies worsening depressive symptoms, suicidal ideation or panic. No fever, night sweats, wt loss, loss of appetite, or other constitutional symptoms.  Pt states good ability with ADL's, has low fall risk, home safety reviewed and adequate, no other significant changes in hearing or vision, and only occasionally active with exercise.  Still with itchy scaly rash in numerous places of the body, has seen 2 or 3 local dermatologist, can not afford the clobetasol. Asks for referral [facility] as was referred last yr in June but missed appt due to being hospitalized. Cont's to see endo for thryoid Past Medical History  Diagnosis Date  . DYSKINESIA, ESOPHAGUS   . FIBROMYALGIA   . HYPERLIPIDEMIA     a. Intolerant to statins.  . INSOMNIA     takes Ambien nightly  . Rash and other nonspecific skin eruption 04/12/2009    no cause found saw dermatologists x 2 and allergist  . Paroxysmal atrial fibrillation ([place])     a. CHA2DS2VASc = 3--> takes Coumadin;  b. 03/15/2015 Successful TEE/DCCV;  c. 03/2015 recurrent afib, Amio d/c'd in setting of hyperthyroidism.  . Cardiomyopathy ([place])     a. EF 25% TEE July 2013; b. EF normalized 2015;  c. 03/2015 Echo: EF 40-45%, difrf HK, PASP 38 mmHg, Mild MR, sev LAE/RAE.  [name] Myocardial infarction [facility])     a. 2012 Myoview notable for prior infarct;  b. 03/2015 Lexiscan CL: EF 37%, diff HK, small area of inferior infarct from apex to  base-->Med Rx.  . ANXIETY     takes Xanax nightly  . ASTHMA     Albuterol prn and Advair daily;also takes Prednisone daily  . GERD (gastroesophageal reflux disease)        . Type II diabetes mellitus ([place])        . Essential hypertension        . COPD (chronic obstructive pulmonary disease) ([place])   . O2 dependent     uses oxygen 2 and 1/2 liters per  at hs  . SLEEP APNEA, OBSTRUCTIVE     a. doesn't use CPAP  . Peripheral neuropathy ([place])   . Arthritis   . Chronic constipation     takes OTC stool softener  . Diverticulitis   . Glaucoma   . DEPRESSION     takes Zoloft and Doxepin daily  . Anemia     supposed to be taking Vit B but doesn't  . Syncope     a. 03/2015 s/p MDT LINQ.  [name] Atrial fibrillation ([place]) 09/06/2015   Past Surgical History  Procedure Laterality Date  . Tonsillectomy    . Acne cyst removal      2 on back   . Cardioversion  04/18/2012    Procedure: CARDIOVERSION;  Surgeon: Fay Records, MD;  Location: [place];  Service: Cardiovascular;  Laterality: N/A;  . Tee without cardioversion  04/25/2012    Procedure: TRANSESOPHAGEAL ECHOCARDIOGRAM (TEE);  Surgeon: Wonda Cheng  Nahser, MD;  Location: [place];  Service: Cardiovascular;  Laterality: N/A;  . Cardioversion  04/25/2012    Procedure: CARDIOVERSION;  Surgeon: Thayer Headings, MD;  Location: [place];  Service: Cardiovascular;  Laterality: N/A;  . Cardioversion  04/25/2012    Procedure: CARDIOVERSION;  Surgeon: Fay Records, MD;  Location: [place];  Service: Cardiovascular;  Laterality: N/A;  . Cardioversion  05/09/2012    Procedure: CARDIOVERSION;  Surgeon: Sherren Mocha, MD;  Location: [place];  Service: Cardiovascular;  Laterality: N/A;  changed from crenshaw to cooper by trish/leone-endo  . Colonoscopy    . Esophagogastroduodenoscopy    . Total knee arthroplasty Right 06/15/2014    Procedure: TOTAL KNEE ARTHROPLASTY;  Surgeon: Renette Butters, MD;  Location: [place];  Service: Orthopedics;  Laterality: Right;    . Colonoscopy with propofol N/A 10/21/2014    Procedure: COLONOSCOPY WITH PROPOFOL;  Surgeon: Ladene Artist, MD;  Location: [facility];  Service: Endoscopy;  Laterality: N/A;  . Tee without cardioversion N/A 03/15/2015    Procedure: TRANSESOPHAGEAL ECHOCARDIOGRAM (TEE);  Surgeon: Thayer Headings, MD;  Location: [place];  Service: Cardiovascular;  Laterality: N/A;  . Cardioversion N/A 03/15/2015    Procedure: CARDIOVERSION;  Surgeon: Thayer Headings, MD;  Location: [place];  Service: Cardiovascular;  Laterality: N/A;  . Ep implantable device N/A 04/06/2015    Procedure: Loop Recorder Insertion;  Surgeon: Evans Lance, MD;  Location: [place] [facility];  Service: Cardiovascular;  Laterality: N/A;    reports that he quit smoking about 8 years ago. His smoking use included Cigarettes. He has a 60 pack-year smoking history. He has never used smokeless tobacco. He reports that he does not drink alcohol or use illicit drugs. family history includes Allergies in his mother; Asthma in his maternal grandmother and mother; COPD in his mother; Colon polyps in his mother; Hypothyroidism in his mother. There is no history of Colon cancer. Allergies  Allergen Reactions  . Pravastatin Sodium Other (See Comments)    myalgia  . Simvastatin Other (See Comments)    severe leg cramps  . Statins Other (See Comments)    myalgia  . Amiodarone     hyperthyroidism  . Tape Other (See Comments)    Skin Tears Use Paper Tape Only   Review of Systems Constitutional: Negative for increased diaphoresis, other activity, appetite or siginficant weight change other than noted HENT: Negative for worsening hearing loss, ear pain, facial swelling, mouth sores and neck stiffness.   Eyes: Negative for other worsening pain, redness or visual disturbance.  Respiratory: Negative for shortness of breath and wheezing  Cardiovascular: Negative for chest pain and palpitations.  Gastrointestinal: Negative for diarrhea,  blood in stool, abdominal distention or other pain Genitourinary: Negative for hematuria, flank pain or change in urine volume.  Musculoskeletal: Negative for myalgias or other joint complaints.  Skin: Negative for color change and wound or drainage.  Neurological: Negative for syncope and numbness. other than noted Hematological: Negative for adenopathy. or other swelling Psychiatric/Behavioral: Negative for hallucinations, SI, self-injury, decreased concentration or other worsening agitation.      Objective:   Physical Exam BP 118/70 mmHg  Pulse 69  Temp(Src) 98.2 F (36.8 C) (Oral)  Ht 5\' 10"  (1.778 m)  Wt 318 lb 8 oz (144.471 kg)  BMI 45.70 kg/m2  SpO2 98% VS noted, morbid obese Constitutional: Pt is oriented to person, place, and time. Appears well-developed and well-nourished, in no significant distress Head: Normocephalic and atraumatic.  Right Ear: External ear normal.  Left Ear: External ear normal.  Nose: Nose normal.  Mouth/Throat: Oropharynx is clear and moist.  Eyes: Conjunctivae and EOM are normal. Pupils are equal, round, and reactive to light.  Neck: Normal range of motion. Neck supple. No JVD present. No tracheal deviation present or significant neck LA or mass Cardiovascular: Normal rate, regular rhythm, normal heart sounds and intact distal pulses.   Pulmonary/Chest: Effort normal and breath sounds without rales or wheezing  Abdominal: Soft. Bowel sounds are normal. NT. No HSM  Musculoskeletal: Normal range of motion. Exhibits no edema.  Lymphadenopathy:  Has no cervical adenopathy.  Neurological: Pt is alert and oriented to person, place, and time. Pt has normal reflexes. No cranial nerve deficit. Motor grossly intact Skin: Skin is warm and dry. + patchy scaly itchy rash noted. to extremities several areas, as well as lower abd and right upper back, left upper arm with excoriations Psychiatric:  Has normal mood and affect. Behavior is normal.     Assessment &  Plan:

## 2015-11-29 NOTE — Assessment & Plan Note (Signed)
C/w dermatitis, for lidex trial, also refer derm [facility]

## 2015-11-29 NOTE — Progress Notes (Signed)
Pre visit review using our clinic review tool, if applicable. No additional management support is needed unless otherwise documented below in the visit note. 

## 2015-11-29 NOTE — Patient Instructions (Signed)
Please take all new medication as prescribed - the new cream (lidex)  Please continue all other medications as before, and refills have been done if requested.  Please have the pharmacy call with any other refills you may need.  Please continue your efforts at being more active, low cholesterol diet, and weight control.  You are otherwise up to date with prevention measures today.  You will be contacted regarding the referral for: Dermatology - [facility]  Please keep your appointments with your specialists as you may have planned  Please go to the LAB in the Basement (turn left off the elevator) for the tests to be done today  You will be contacted by phone if any changes need to be made immediately.  Otherwise, you will receive a letter about your results with an explanation, but please check with MyChart first.  Please remember to sign up for MyChart if you have not done so, as this will be important to you in the future with finding out test results, communicating by private email, and scheduling acute appointments online when needed.  Please return in 6 months, or sooner if needed, with Lab testing done 3-5 days before

## 2015-11-29 NOTE — Assessment & Plan Note (Signed)
stable overall by history and exam, recent data reviewed with pt, and pt to continue medical treatment as before,  to f/u any worsening symptoms or concerns Lab Results  Component Value Date   LDLCALC 54 03/07/2015   Has been intol of some statins in past

## 2015-11-29 NOTE — Assessment & Plan Note (Signed)
Overall doing well, age appropriate education and counseling updated, referrals for preventative services and immunizations addressed, dietary and smoking counseling addressed, most recent labs reviewed.  I have personally reviewed and have noted:  1) the patient's medical and social history 2) The pt's use of alcohol, tobacco, and illicit drugs 3) The patient's current medications and supplements 4) Functional ability including ADL's, fall risk, home safety risk, hearing and visual impairment 5) Diet and physical activities 6) Evidence for depression or mood disorder 7) The patient's height, weight, and BMI have been recorded in the chart  I have made referrals, and provided counseling and education based on review of the above  

## 2015-11-29 NOTE — Assessment & Plan Note (Signed)
stable overall by history and exam, recent data reviewed with pt, and pt to continue medical treatment as before,  to f/u any worsening symptoms or concerns BP Readings from Last 3 Encounters:  11/29/15 118/70  11/21/15 124/80  11/15/15 118/72

## 2015-11-29 NOTE — Progress Notes (Signed)
I have reviewed and agree with the plan. 

## 2015-11-30 ENCOUNTER — Other Ambulatory Visit: Payer: Self-pay | Admitting: Pharmacist

## 2015-11-30 LAB — HEPATITIS C ANTIBODY: HCV Ab: NEGATIVE

## 2015-11-30 NOTE — Patient Outreach (Signed)
[name]) Care Management  [name]   11/30/2015  [name] [birth year] [id number]  Subjective: [name] is a 60yo who was referred to [name] for medication assistance.  I made outreach call to patient today to follow up after his PCP visit on 11/29/15.  Patient reports he had Dr. Jenny [name] send his several of his prescriptions to [name] mail order pharmacy to [name] reduce medication cost.    At last home visit, patient had reported he was not taking his potassium supplement.  I contacted PCP regarding potassium and requested clarification as to whether patient should restart taking potassium.  I asked patient if he talked to Dr. Jenny [name] regarding potassium supplement at the office visit and patient reports he forgot to talk to him about it.  Patient reports he is still not taking potassium.  Patient reports compliance with all other medications.  I reviewed purpose and proper use of all medications.    Objective:   Current Medications: Current Outpatient Prescriptions  Medication Sig Dispense Refill  . ADVAIR DISKUS 250-50 MCG/DOSE AEPB INHALE 1 PUFF EVERY DAY AS DIRECTED 60 each 3  . albuterol (PROVENTIL HFA;VENTOLIN HFA) 108 (90 BASE) MCG/ACT inhaler Inhale 2 puffs into the lungs 4 times a day as needed    . ALPRAZolam (XANAX) 0.5 MG tablet TAKE 1 TABLET BY MOUTH TWICE A DAY AS NEEDED FOR ANXIETY 60 tablet 2  . carisoprodol (SOMA) 350 MG tablet Take 1 tablet (350 mg total) by mouth 3 (three) times daily as needed. 90 tablet 2  . diltiazem (CARDIZEM CD) 120 MG 24 hr capsule Take 1 capsule (120 mg total) by mouth daily. 90 capsule 1  . doxepin (SINEQUAN) 25 MG capsule Take 1 capsule (25 mg total) by mouth 2 (two) times daily. 90 capsule 1  . hydrOXYzine (ATARAX/VISTARIL) 25 MG tablet Take 25 mg by mouth 3 (three) times daily as needed. Reported on 11/15/2015    . ibuprofen (ADVIL,MOTRIN) 200 MG tablet Take 400 mg by mouth 2 (two) times daily.    [name]  levothyroxine (SYNTHROID) 25 MCG tablet Take 1 tablet (25 mcg total) by mouth daily before breakfast. 30 tablet 1  . Linaclotide (LINZESS) 145 MCG CAPS capsule Take 145 mcg by mouth daily as needed (Crampy pain).     . Loratadine 10 MG CAPS Take 10 mg by mouth daily.     . nebivolol (BYSTOLIC) 5 MG tablet Take 1.5 tablets (7.5 mg total) by mouth daily. 90 tablet 1  . omeprazole (PRILOSEC) 20 MG capsule Take 1 capsule (20 mg total) by mouth daily with supper. 90 capsule 1  . sertraline (ZOLOFT) 100 MG tablet Take 1 tablet (100 mg total) by mouth daily. 90 tablet 1  . torsemide (DEMADEX) 20 MG tablet Take 2 tablets (40 mg total) by mouth 2 (two) times daily. 180 tablet 1  . traZODone (DESYREL) 100 MG tablet Take 1/2 tablet (50 mg) by mouth once daily    . warfarin (COUMADIN) 5 MG tablet Take as directed by anticoagulation clinic 30 tablet 3  . zolpidem (AMBIEN) 10 MG tablet TAKE 1 TABLET BY MOUTH AT BEDTIME 30 tablet 5  . clobetasol cream (TEMOVATE) 0.05 % APPLY TOPICALLY EVERY DAY AS NEEDED FOR ITCHING (Patient not taking: Reported on 11/30/2015) 30 g 1  . fluocinonide cream (LIDEX) [id number] % Apply 1 application topically 2 (two) times daily. (Patient not taking: Reported on 11/30/2015) 30 g 0  . hydrocortisone 2.5 % cream Apply topically  2 (two) times daily as needed. 453.6 g 1  . KLOR-CON M20 20 MEQ tablet TAKE 2 TABLETS BY MOUTH EVERY DAY (Patient not taking: Reported on 11/30/2015) 30 tablet 11  . silver sulfADIAZINE (SILVADENE) 1 % cream Apply 1 application topically daily. 50 g 0  . tolnaftate (TINACTIN) 1 % powder Apply 1 application topically 2 (two) times daily. Apply to Groin area    . triamcinolone cream (KENALOG) 0.1 % Apply 1 application topically 2 (two) times daily. 30 g 0  . witch hazel-glycerin (TUCKS) pad Apply 1 application topically as needed for itching, irritation or hemorrhoids.     . [DISCONTINUED] atorvastatin (LIPITOR) 20 MG tablet Take 1 tablet (20 mg total) by mouth daily. 30  tablet 11  . [DISCONTINUED] gabapentin (NEURONTIN) 300 MG capsule Take 1 capsule (300 mg total) by mouth 3 (three) times daily. 90 capsule 5   No current facility-administered medications for this visit.    Functional Status: In your present state of health, do you have any difficulty performing the following activities: 11/15/2015 09/01/2015  Hearing? N N  Vision? Y Y  Difficulty concentrating or making decisions? N N  Walking or climbing stairs? Y Y  Dressing or bathing? N N  Doing errands, shopping? N N  Preparing Food and eating ? N -  Using the Toilet? N -  In the past six months, have you accidently leaked urine? N -  Do you have problems with loss of bowel control? N -  Managing your Medications? N -  Managing your Finances? Y -  Housekeeping or managing your Housekeeping? N -    Fall/Depression Screening: PHQ 2/9 Scores 11/15/2015 04/25/2015 04/11/2015 02/23/2015 02/16/2014  PHQ - 2 Score 2 0 0 0 1  PHQ- 9 Score 13 - - - -  Exception Documentation - Patient refusal - - -    Assessment: 1.  Medication adherence:  Patient reports compliance with all medications except potassium.  I will route this note to patient's PCP and patient's cardiologist to inform him that patient reports he is not taking potassium supplement.    2.  Medication assistance:  Patient confirms he had Dr. Jenny [name] send several of his prescription medications to [name] mail order pharmacy at office visit yesterday to reduce medication cost (tier 1 and 2 medications are zero copay).  Patient reports he has not received a letter from [name] yet regarding the application submitted on 11/22/15.  Patient reports he will call me once he receives a letter in the mail.     Plan: 1.  Patient to take all medications as prescribed.   2.  Will alert patient's PCP and cardiologist that he is not taking potassium supplement.   3.  Patient to call me once he receives letter from social security regarding [name]  application.  I will make outreach call to patient within 2 weeks if I have not heard from him.    4.  [facility] Care Management Assistant Damita Rhodie is assisting with completion and submission of [employer] patient assistance application for Bystolic and Linzess.  Will continue to follow up regarding patient assistance.     Elisabeth Most, Pharm.D. Pharmacy Resident [place] [phone]

## 2015-12-02 ENCOUNTER — Ambulatory Visit: Payer: Commercial Managed Care - HMO

## 2015-12-02 ENCOUNTER — Ambulatory Visit (INDEPENDENT_AMBULATORY_CARE_PROVIDER_SITE_OTHER): Payer: Commercial Managed Care - HMO | Admitting: *Deleted

## 2015-12-02 DIAGNOSIS — R55 Syncope and collapse: Secondary | ICD-10-CM | POA: Diagnosis not present

## 2015-12-02 NOTE — Progress Notes (Signed)
Carelink Summary Report / Loop Recorder 

## 2015-12-05 ENCOUNTER — Other Ambulatory Visit: Payer: Self-pay | Admitting: Pharmacist

## 2015-12-05 ENCOUNTER — Telehealth: Payer: Self-pay | Admitting: Internal Medicine

## 2015-12-05 NOTE — Patient Outreach (Signed)
[place] [facility]) Care Management  [place]   12/05/2015  [name] [birth year] [id number]   Care coordination:  [name] is a 60yo who I am following for medication assistance.  I received a voicemail from patient requesting a call back regarding questions about [employer] and [employer].    I made outreach call to patient.  Patient reports he received a letter from [employer] stating he does not qualify for any additional assistance at this time.  I updated patient that he may still qualify for patient assistance programs for Bystolic and Linzess and that Center Management Assistant Damita Rhodie is assisting in getting those applications submitted to the drug company.    Patient states he has had several calls with [facility] Representatives over the past few days regarding his medications and is confused about the status of his medication refills.  I made a three way call to [facility] mail order regarding patient's prescriptions.  We spoke to [name].  [name] reports that patient's Bystolic prescription is currently pending and should be mailed today or tomorrow.  Sertraline, omeprazole, and doxepin have been processed but are too soon to refill.  The following prescriptions are "on hold for doctor call screening:" fluocinonide, triamcinolone, diltiazem, hydrocortisone, and torsemide.  I asked for clarification about what "on hold for doctor screening" meant.  [name] placed [place] on hold to find out what that meant and when he returned he reported that all of those prescriptions are currently undergoing normal processing now.  He advised patient to call [facility] mail order again by the end of the week if he does not get notification regarding prescription delivery dates.    Patient reports he currently has all medications at this time but reports concern about running low on Bystolic.  Patient states he has three days of Bystolic remaining.  I will  follow up with patient on Wednesday morning regarding status of Bystolic prescription through [facility] mail order.  I spoke to [name] who confirmed that patient has a refill of Bystolic available at their Pharmacy and he can get a one week supply filled until his mail order arrives. I updated [name] on this information and he voiced understanding.    Plan: I will reach out to patient on 12/07/15 to follow up on enrollment in [facility] mail order.     Elisabeth Most, Pharm.D. Pharmacy Resident [place] [phone]

## 2015-12-05 NOTE — Telephone Encounter (Signed)
Assisting patient with patient assistance.  Is needing scripts to be sent over for linzess and bystolic to submit with application Dr. [name] has already completed and faxed back. fax is [phone]

## 2015-12-07 ENCOUNTER — Other Ambulatory Visit: Payer: Self-pay | Admitting: Pharmacist

## 2015-12-07 NOTE — Patient Outreach (Signed)
[place] [facility]) Care Management  12/07/2015  JUNIPER FORMELLA 25-Mar-1956 [id number]   Nitish Kestel is a 60yo who I am following for medication assistance.  I received return call from patient who reports that he has not heard from [facility] mail order pharmacy regarding status of his Bystolic prescription.    I made a three way phone call to [place] with the patient on the phone.  I spoke to pharmacy technician Kovie who reports the Bystolic prescription was processed on 12/06/15 and provided [place] with a UPS tracking number [phone]).  I tracked patient's order and identified that order had been processed and was ready for pick up by UPS.  No anticipated delivery date was listed.  I informed Paschal Dopp that patient only had one more dose of Bystolic for today and asked if it would be possible to get a temporary fill from a local pharmacy.  Kovie reports patient will be able to get temporary fill since mail order pharmacy prescription has been sent yet will not get there before patient runs out of his medication.  She reports the local pharmacy can contact [employer] if they have any trouble processing the temporary fill.    I made a three way call to [facility] regarding temporary fill of Bystolic.  I spoke to the pharmacist Lennette Bihari who confirms that patient has a [employer] prescription on file and reports he will contact [employer] regarding approval for a temporary fill.    Plan:  Will follow up with patient on 12/08/15.     Elisabeth Most, Pharm.D. Pharmacy Resident [facility] [phone]

## 2015-12-07 NOTE — Patient Outreach (Signed)
[name]) Care Management  12/07/2015  [name] 1956-07-30 [id number]   Care coordination:  [name] is a 60yo who I am following for medication assistance.  I made outreach call to patient to follow up regarding status of [employer] mail order delivery of his medications, specifically Bystolic.  There was no answer.  I left HIPAA compliant voicemail for patient to return my call.     [name], Pharm.D. Pharmacy Resident [name] [phone]

## 2015-12-08 ENCOUNTER — Other Ambulatory Visit: Payer: Self-pay | Admitting: Pharmacist

## 2015-12-08 NOTE — Patient Outreach (Signed)
[place] [facility]) Care Management  12/08/2015  [name] 21-Mar-1956 [id number]   [name] is a 60yo who I am following for medication assistance.  I made outreach call to patient today who reports he was able to pick up a 7 day supply of Bystolic from [facility] yesterday evening.  I also tracked patient's Bystolic order from [facility] mail order pharmacy and identified that prescription is set to be delivered to patient today.  I updated patient on scheduled delivery.  Patient reports he has all of his medications at this time.    Plan:  Will continue to follow patient and assist with medication assistance needs.  I am awaiting processing of patient assistance applications for Bysolic and Linzess.  [facility] Care Management Assistant Damita Rhodie is assisting with patient assistance applications.  I informed patient I will update him once his applications have been processed.     Elisabeth Most, Pharm.D. Pharmacy Resident [place] [phone]

## 2015-12-09 MED ORDER — NEBIVOLOL HCL 5 MG PO TABS: 7.5000 mg | ORAL_TABLET | Freq: Every day | ORAL | Status: DC

## 2015-12-09 MED ORDER — LINACLOTIDE 145 MCG PO CAPS: 145.0000 ug | ORAL_CAPSULE | Freq: Every day | ORAL | Status: DC

## 2015-12-09 NOTE — Telephone Encounter (Signed)
Both - Done hardcopy to dahlia

## 2015-12-09 NOTE — Telephone Encounter (Signed)
Demita is calling from [facility] checking on this request. Please help, they really need this Rx  [phone]

## 2015-12-09 NOTE — Telephone Encounter (Signed)
Forwarded to [place] because I do not have the application

## 2015-12-12 ENCOUNTER — Other Ambulatory Visit: Payer: Self-pay

## 2015-12-12 ENCOUNTER — Other Ambulatory Visit: Payer: Self-pay | Admitting: Pharmacist

## 2015-12-12 NOTE — Patient Outreach (Signed)
[facility]) Care Management   12/12/2015  [name] [birth year] [id number]  [name] is an 60 y.o. male Arrived for home visit. Patient answered door without difficulty. Subjective:  Patient reports that he is doing well. States that his breathing is doing better. Worried that pollen season is approaching. States that he had follow up with primary Md and endocrinologist. Patient reports that he has switched to mail order pharmacy. Patient reports that he got a new battery for his scale but his scale in still not working. Patient reports that he continues to have skin problems and states that he is going to [facility] on 02/01/2016 for evaluation.  Patient denies any new concerns today. Objective:  Awake and alert. Ambulating without difficulty. Filed Vitals:   12/12/15 1300  BP: 108/62  Pulse: 76  Resp: 18  Weight: 317 lb (143.79 kg)  SpO2: 96%   Review of Systems  Constitutional: Negative.   HENT: Negative.   Eyes: Negative.   Respiratory: Negative for shortness of breath.   Cardiovascular: Positive for chest pain and leg swelling.       Reports occasional chest pain  Gastrointestinal: Negative.   Genitourinary: Negative.   Musculoskeletal: Negative.  Negative for falls.  Skin: Positive for itching and rash.  Neurological: Negative.   Endo/Heme/Allergies: Bruises/bleeds easily.  Psychiatric/Behavioral: Negative.     Physical Exam  Constitutional: He appears well-developed and well-nourished.  Cardiovascular: Normal rate and intact distal pulses.   Irregular rhythm  Respiratory: Effort normal and breath sounds normal.  Lungs clear  GI: Soft. Bowel sounds are normal.  Musculoskeletal: Normal range of motion. He exhibits edema.  1 plus edema to the lower legs  Skin: Skin is warm and dry.  Psychiatric: He has a normal mood and affect. His behavior is normal. Judgment and thought content normal.    Current Medications:   Current Outpatient  Prescriptions  Medication Sig Dispense Refill  . ADVAIR DISKUS 250-50 MCG/DOSE AEPB INHALE 1 PUFF EVERY DAY AS DIRECTED 60 each 3  . albuterol (PROVENTIL HFA;VENTOLIN HFA) 108 (90 BASE) MCG/ACT inhaler Inhale 2 puffs into the lungs 4 times a day as needed    . ALPRAZolam (XANAX) 0.5 MG tablet TAKE 1 TABLET BY MOUTH TWICE A DAY AS NEEDED FOR ANXIETY 60 tablet 2  . carisoprodol (SOMA) 350 MG tablet Take 1 tablet (350 mg total) by mouth 3 (three) times daily as needed. 90 tablet 2  . clobetasol cream (TEMOVATE) 0.05 % APPLY TOPICALLY EVERY DAY AS NEEDED FOR ITCHING (Patient not taking: Reported on 11/30/2015) 30 g 1  . diltiazem (CARDIZEM CD) 120 MG 24 hr capsule Take 1 capsule (120 mg total) by mouth daily. 90 capsule 1  . doxepin (SINEQUAN) 25 MG capsule Take 1 capsule (25 mg total) by mouth 2 (two) times daily. 90 capsule 1  . fluocinonide cream (LIDEX) [id number] % Apply 1 application topically 2 (two) times daily. (Patient not taking: Reported on 11/30/2015) 30 g 0  . hydrocortisone 2.5 % cream Apply topically 2 (two) times daily as needed. 453.6 g 1  . hydrOXYzine (ATARAX/VISTARIL) 25 MG tablet Take 25 mg by mouth 3 (three) times daily as needed. Reported on 11/15/2015    . ibuprofen (ADVIL,MOTRIN) 200 MG tablet Take 400 mg by mouth 2 (two) times daily.    [name] KLOR-CON M20 20 MEQ tablet TAKE 2 TABLETS BY MOUTH EVERY DAY (Patient not taking: Reported on 11/30/2015) 30 tablet 11  . levothyroxine (SYNTHROID) 25 MCG tablet  Take 1 tablet (25 mcg total) by mouth daily before breakfast. 30 tablet 1  . Linaclotide (LINZESS) 145 MCG CAPS capsule Take 145 mcg by mouth daily as needed (Crampy pain).     . Linaclotide (LINZESS) 145 MCG CAPS capsule Take 1 capsule (145 mcg total) by mouth daily. 90 capsule 3  . Loratadine 10 MG CAPS Take 10 mg by mouth daily.     . nebivolol (BYSTOLIC) 5 MG tablet Take 1.5 tablets (7.5 mg total) by mouth daily. 90 tablet 3  . omeprazole (PRILOSEC) 20 MG capsule Take 1 capsule (20 mg  total) by mouth daily with supper. 90 capsule 1  . sertraline (ZOLOFT) 100 MG tablet Take 1 tablet (100 mg total) by mouth daily. 90 tablet 1  . silver sulfADIAZINE (SILVADENE) 1 % cream Apply 1 application topically daily. 50 g 0  . tolnaftate (TINACTIN) 1 % powder Apply 1 application topically 2 (two) times daily. Apply to Groin area    . torsemide (DEMADEX) 20 MG tablet Take 2 tablets (40 mg total) by mouth 2 (two) times daily. 180 tablet 1  . traZODone (DESYREL) 100 MG tablet Take 1/2 tablet (50 mg) by mouth once daily    . triamcinolone cream (KENALOG) 0.1 % Apply 1 application topically 2 (two) times daily. 30 g 0  . warfarin (COUMADIN) 5 MG tablet Take as directed by anticoagulation clinic 30 tablet 3  . witch hazel-glycerin (TUCKS) pad Apply 1 application topically as needed for itching, irritation or hemorrhoids.     [name] zolpidem (AMBIEN) 10 MG tablet TAKE 1 TABLET BY MOUTH AT BEDTIME 30 tablet 5  . [DISCONTINUED] atorvastatin (LIPITOR) 20 MG tablet Take 1 tablet (20 mg total) by mouth daily. 30 tablet 11  . [DISCONTINUED] gabapentin (NEURONTIN) 300 MG capsule Take 1 capsule (300 mg total) by mouth 3 (three) times daily. 90 capsule 5   No current facility-administered medications for this visit.    Functional Status:   In your present state of health, do you have any difficulty performing the following activities: 11/15/2015 09/01/2015  Hearing? N N  Vision? Y Y  Difficulty concentrating or making decisions? N N  Walking or climbing stairs? Y Y  Dressing or bathing? N N  Doing errands, shopping? N N  Preparing Food and eating ? N -  Using the Toilet? N -  In the past six months, have you accidently leaked urine? N -  Do you have problems with loss of bowel control? N -  Managing your Medications? N -  Managing your Finances? Y -  Housekeeping or managing your Housekeeping? N -    Fall/Depression Screening:    PHQ 2/9 Scores 11/15/2015 04/25/2015 04/11/2015 02/23/2015 02/16/2014   PHQ - 2 Score 2 0 0 0 1  PHQ- 9 Score 13 - - - -  Exception Documentation - Patient refusal - - -    Assessment:   (1) pending dermatology appointment. (2) not weighing/scale not working (3)breathing improved. (4) no recent falls. (5) has switched to mail order pharmacy.  Plan:  (1) encouraged patient to keep MD appointments. (2)encouraged patient to call [employer] to inquire about replacement of scale that was provided by [facility]. Patient reports he is comfortable calling [employer] himself and declines needing my assistance. Encouraged patient to call if he can not get a scale. (3)Encouraged patient to take all meds as prescribed and keep follow up MD appointments. (4)Encouraged patient to continue to be cautious of falls. (5) Patient reports that he will  run out of dilitazem. Encouraged patient to notify [facility] pharmacist. Will also send an in basket message to pharmacy.  Reviewed patient goals with patient during home visit. Patient denies any new goals. Patient denies need for continued home visits. Patient in agreement to case closure for nursing but patient will remain active with [facility] pharmacy and pharmacy assistance.    Tomasa Rand, RN, BSN, CEN [facility] [employer] [phone]

## 2015-12-12 NOTE — Patient Outreach (Signed)
[facility]) Care Management  12/12/2015  TYEE HOGGARD [birth year] [id number]   Orr Viscomi is a 60yo who I am following for medication assistance.  I received a phone call from patient today who reports that he had a home visit by [facility] Tomasa Rand who pointed out to patient that he only has a few doses of diltiazem remaining in his current prescription bottle.  Patient voices concern because he said diltiazem prescription is on hold at [facility] mail order pharmacy waiting for payment before it can be sent and patient reports he cannot pay for prescription until he gets paid on 12/16/15.    I reviewed patient's insurance plan and determined that diltiazem does not have a copay through [facility] mail order so I made a phone call to [facility] mail order to check on the status of patient's prescription.  I spoke to pharmacy technician Seth Bake at [facility] mail order who reports that diltiazem was shipped on 12/09/15 and provided me with USPS tracking number of the prescription [id number]).  Seth Bake reports medication is scheduled to be delivered to patient today.  Seth Bake reports the medication waiting for payment is doxepin.    I made a call to Mr. Rando and updated him on the prescription status of diltiazem and doxepin.  Patient voices understanding and reports he has two to three weeks of doxepin at home and will make payment for the doxepin once he gets paid.    Plan:  Will continue to follow patient and assist with medication assistance needs.  I am awaiting processing of patient assistance applications for Bystolic and Linzess.  [facility] Care Management Assistant Damita Rhodie is assisting with patient assistance applications.  I informed patient I will update him once his applications have been processed.     Elisabeth Most, Pharm.D. Pharmacy Resident [place] [phone]

## 2015-12-12 NOTE — Telephone Encounter (Signed)
Form has already been faxed over, medications were faxed to [phone]

## 2015-12-13 DIAGNOSIS — G4733 Obstructive sleep apnea (adult) (pediatric): Secondary | ICD-10-CM | POA: Diagnosis not present

## 2015-12-16 ENCOUNTER — Other Ambulatory Visit (INDEPENDENT_AMBULATORY_CARE_PROVIDER_SITE_OTHER): Payer: Commercial Managed Care - HMO

## 2015-12-16 DIAGNOSIS — E039 Hypothyroidism, unspecified: Secondary | ICD-10-CM

## 2015-12-16 LAB — T4, FREE: Free T4: 0.76 ng/dL (ref 0.60–1.60)

## 2015-12-16 LAB — TSH: TSH: 3.26 u[IU]/mL (ref 0.35–4.50)

## 2015-12-19 ENCOUNTER — Ambulatory Visit (INDEPENDENT_AMBULATORY_CARE_PROVIDER_SITE_OTHER): Payer: Commercial Managed Care - HMO | Admitting: Endocrinology

## 2015-12-19 ENCOUNTER — Encounter: Payer: Self-pay | Admitting: Endocrinology

## 2015-12-19 ENCOUNTER — Ambulatory Visit: Payer: Commercial Managed Care - HMO | Admitting: Endocrinology

## 2015-12-19 VITALS — BP 116/78 | HR 67 | Temp 98.5°F | Resp 16 | Ht 70.0 in | Wt 323.0 lb

## 2015-12-19 DIAGNOSIS — R7989 Other specified abnormal findings of blood chemistry: Secondary | ICD-10-CM | POA: Diagnosis not present

## 2015-12-19 NOTE — Progress Notes (Signed)
Patient ID: ZO [name], male   DOB: [birth year], 60 y.o.   MRN: [id number]                                                                                                               Reason for Appointment:   follow-up for thyroid  Referring physician: Cathlean [name]   History of Present Illness:   Prior to his consultation the patient  had symptoms of palpitations, shakiness, feeling excessively warm and sweaty, and fatigue.  Because of his taking amiodarone for 3 years he has had periodic monitoring of his thyroid levels and was found to be hyperthyroid in 6/16 His amiodarone was stopped in June 2016  He was started on methimazole and the dose was increased to 20 mg twice a day; his free T4 was high at 2.7 in 8/16 Although he did not come back for follow-up and had no other thyroid labs he continued methimazole on his own until 1/17  Even without methimazole in 2/17 his TSH was significantly higher at 9.2 However with a trial of levothyroxine 25 g he does not think he has any better energy level Still complains of feeling cold at times  His weight fluctuates based on his fluid status  His thyroid levels are back to normal    Wt Readings from Last 3 Encounters:  12/19/15 323 lb (146.512 kg)  12/12/15 317 lb (143.79 kg)  11/29/15 318 lb 8 oz (144.471 kg)    Lab Results  Component Value Date   TSH 3.26 12/16/2015   TSH 2.60 11/29/2015   TSH 9.16* 11/16/2015   FREET4 0.76 12/16/2015   FREET4 0.50* 11/16/2015   FREET4 1.42 06/27/2015          Medication List       This list is accurate as of: 12/19/15  8:47 PM.  Always use your most recent med list.               ADVAIR DISKUS 250-50 MCG/DOSE Aepb  Generic drug:  Fluticasone-Salmeterol  INHALE 1 PUFF EVERY DAY AS DIRECTED     albuterol 108 (90 Base) MCG/ACT inhaler  Commonly known as:  PROVENTIL HFA;VENTOLIN HFA  Inhale 2 puffs into the lungs 4 times a day as needed     ALPRAZolam 0.5 MG tablet  Commonly  known as:  XANAX  TAKE 1 TABLET BY MOUTH TWICE A DAY AS NEEDED FOR ANXIETY     carisoprodol 350 MG tablet  Commonly known as:  SOMA  Take 1 tablet (350 mg total) by mouth 3 (three) times daily as needed.     diltiazem 120 MG 24 hr capsule  Commonly known as:  CARDIZEM CD  Take 1 capsule (120 mg total) by mouth daily.     doxepin 25 MG capsule  Commonly known as:  SINEQUAN  Take 1 capsule (25 mg total) by mouth 2 (two) times daily.     fluocinonide cream 0.05 %  Commonly known as:  LIDEX  Apply 1 application topically 2 (two)  times daily.     hydrocortisone 2.5 % cream  Apply topically 2 (two) times daily as needed.     hydrOXYzine 25 MG tablet  Commonly known as:  ATARAX/VISTARIL  Take 25 mg by mouth 3 (three) times daily as needed. Reported on 11/15/2015     ibuprofen 200 MG tablet  Commonly known as:  ADVIL,MOTRIN  Take 400 mg by mouth 2 (two) times daily.     KLOR-CON M20 20 MEQ tablet  Generic drug:  potassium chloride SA  TAKE 2 TABLETS BY MOUTH EVERY DAY     levothyroxine 25 MCG tablet  Commonly known as:  SYNTHROID  Take 1 tablet (25 mcg total) by mouth daily before breakfast.     Linaclotide 145 MCG Caps capsule  Commonly known as:  LINZESS  Take 145 mcg by mouth daily as needed (Crampy pain).     Loratadine 10 MG Caps  Take 10 mg by mouth daily.     nebivolol 5 MG tablet  Commonly known as:  BYSTOLIC  Take 1.5 tablets (7.5 mg total) by mouth daily.     omeprazole 20 MG capsule  Commonly known as:  PRILOSEC  Take 1 capsule (20 mg total) by mouth daily with supper.     sertraline 100 MG tablet  Commonly known as:  ZOLOFT  Take 1 tablet (100 mg total) by mouth daily.     silver sulfADIAZINE 1 % cream  Commonly known as:  SILVADENE  Apply 1 application topically daily.     tolnaftate 1 % powder  Commonly known as:  TINACTIN  Apply 1 application topically 2 (two) times daily. Apply to Groin area     torsemide 20 MG tablet  Commonly known as:   DEMADEX  Take 2 tablets (40 mg total) by mouth 2 (two) times daily.     traZODone 100 MG tablet  Commonly known as:  DESYREL  Take 1/2 tablet (50 mg) by mouth once daily     triamcinolone cream 0.1 %  Commonly known as:  KENALOG  Apply 1 application topically 2 (two) times daily.     warfarin 5 MG tablet  Commonly known as:  COUMADIN  Take as directed by anticoagulation clinic     witch hazel-glycerin pad  Commonly known as:  TUCKS  Apply 1 application topically as needed for itching, irritation or hemorrhoids.     zolpidem 10 MG tablet  Commonly known as:  AMBIEN  TAKE 1 TABLET BY MOUTH AT BEDTIME            Past Medical History  Diagnosis Date  . DYSKINESIA, ESOPHAGUS   . FIBROMYALGIA   . HYPERLIPIDEMIA     a. Intolerant to statins.  . INSOMNIA     takes Ambien nightly  . Rash and other nonspecific skin eruption 04/12/2009    no cause found saw dermatologists x 2 and allergist  . Paroxysmal atrial fibrillation ([place])     a. CHA2DS2VASc = 3--> takes Coumadin;  b. 03/15/2015 Successful TEE/DCCV;  c. 03/2015 recurrent afib, Amio d/c'd in setting of hyperthyroidism.  . Cardiomyopathy ([place])     a. EF 25% TEE July 2013; b. EF normalized 2015;  c. 03/2015 Echo: EF 40-45%, difrf HK, PASP 38 mmHg, Mild MR, sev LAE/RAE.  [name] Myocardial infarction [facility])     a. 2012 Myoview notable for prior infarct;  b. 03/2015 Lexiscan CL: EF 37%, diff HK, small area of inferior infarct from apex to base-->Med Rx.  . ANXIETY  takes Xanax nightly  . ASTHMA     Albuterol prn and Advair daily;also takes Prednisone daily  . GERD (gastroesophageal reflux disease)        . Type II diabetes mellitus ([place])        . Essential hypertension        . COPD (chronic obstructive pulmonary disease) ([place])   . O2 dependent     uses oxygen 2 and 1/2 liters per [place] at hs  . SLEEP APNEA, OBSTRUCTIVE     a. doesn't use CPAP  . Peripheral neuropathy ([place])   . Arthritis   . Chronic constipation     takes OTC  stool softener  . Diverticulitis   . Glaucoma   . DEPRESSION     takes Zoloft and Doxepin daily  . Anemia     supposed to be taking Vit B but doesn't  . Syncope     a. 03/2015 s/p MDT LINQ.  [name] Atrial fibrillation ([place]) 09/06/2015    Past Surgical History  Procedure Laterality Date  . Tonsillectomy    . Acne cyst removal      2 on back   . Cardioversion  04/18/2012    Procedure: CARDIOVERSION;  Surgeon: Fay Records, MD;  Location: [place];  Service: Cardiovascular;  Laterality: N/A;  . Tee without cardioversion  04/25/2012    Procedure: TRANSESOPHAGEAL ECHOCARDIOGRAM (TEE);  Surgeon: Thayer Headings, MD;  Location: [place];  Service: Cardiovascular;  Laterality: N/A;  . Cardioversion  04/25/2012    Procedure: CARDIOVERSION;  Surgeon: Thayer Headings, MD;  Location: [facility] ENDOSCOPY;  Service: Cardiovascular;  Laterality: N/A;  . Cardioversion  04/25/2012    Procedure: CARDIOVERSION;  Surgeon: Fay Records, MD;  Location: [place];  Service: Cardiovascular;  Laterality: N/A;  . Cardioversion  05/09/2012    Procedure: CARDIOVERSION;  Surgeon: Sherren Mocha, MD;  Location: [place];  Service: Cardiovascular;  Laterality: N/A;  changed from crenshaw to cooper by trish/leone-endo  . Colonoscopy    . Esophagogastroduodenoscopy    . Total knee arthroplasty Right 06/15/2014    Procedure: TOTAL KNEE ARTHROPLASTY;  Surgeon: Renette Butters, MD;  Location: [place];  Service: Orthopedics;  Laterality: Right;  . Colonoscopy with propofol N/A 10/21/2014    Procedure: COLONOSCOPY WITH PROPOFOL;  Surgeon: Ladene Artist, MD;  Location: [facility] ENDOSCOPY;  Service: Endoscopy;  Laterality: N/A;  . Tee without cardioversion N/A 03/15/2015    Procedure: TRANSESOPHAGEAL ECHOCARDIOGRAM (TEE);  Surgeon: Thayer Headings, MD;  Location: [place];  Service: Cardiovascular;  Laterality: N/A;  . Cardioversion N/A 03/15/2015    Procedure: CARDIOVERSION;  Surgeon: Thayer Headings, MD;  Location: [place];  Service:  Cardiovascular;  Laterality: N/A;  . Ep implantable device N/A 04/06/2015    Procedure: Loop Recorder Insertion;  Surgeon: Evans Lance, MD;  Location: [place] CV LAB;  Service: Cardiovascular;  Laterality: N/A;    Family History  Problem Relation Age of Onset  . COPD Mother   . Asthma Mother   . Colon polyps Mother   . Allergies Mother   . Hypothyroidism Mother   . Asthma Maternal Grandmother   . Colon cancer Neg Hx     Social History:  reports that he quit smoking about 8 years ago. His smoking use included Cigarettes. He has a 60 pack-year smoking history. He has never used smokeless tobacco. He reports that he does not drink alcohol or use illicit drugs.  Allergies:  Allergies  Allergen Reactions  .  Pravastatin Sodium Other (See Comments)    myalgia  . Simvastatin Other (See Comments)    severe leg cramps  . Statins Other (See Comments)    myalgia  . Amiodarone     hyperthyroidism  . Tape Other (See Comments)    Skin Tears Use Paper Tape Only    Review of Systems:  Review of Systems  Cardiovascular: Positive for palpitations and leg swelling.       Atrial fib since 2001       He had been diagnosed to have mild diabetes but blood sugars have been controlled in 2016, has not followed up for this with PCP   Lab Results  Component Value Date   HGBA1C 5.8 11/29/2015     Examination:   BP 116/78 mmHg  Pulse 67  Temp(Src) 98.5 F (36.9 C)  Resp 16  Ht 5\' 10"  (1.778 m)  Wt 323 lb (146.512 kg)  BMI 46.35 kg/m2  SpO2 92%   Neck: The thyroid is palpable on swallowing, about 1-1/2 times normal in the right side and Just palpable on the left, slightly firm and smooth  His reflexes at biceps are difficult to elicit but appear normal    Assessment/Plan:   Multinodular goiter, relatively small History of hyperthyroidism from long-term use of amiodarone  More recently appears to be mildly hypothyroid with TSH improving on 25 g of levothyroxine He is  however not subjectively any better with supplementation although he has multiple causes of fatigue  Since TSH is quite normal will continue levothyroxine for now unless TSH is low normal  Follow-up in 2 months    [name] 12/19/2015, 8:47 PM

## 2015-12-22 NOTE — Patient Outreach (Signed)
[facility]) Care Management  12/22/2015  [name] [birth year] [id number]   Telephone outreach to [place] patient assistance program to check the status of the patient assistance application faxed over on [id number] for his Bystolic and Linzess medications. Representative stated that due to the high volume of re-enrollments and new applications that it is taking 4-6 weeks to receive and process applications in the system. I will try back in about a week and a half to check the status.  Dena Esperanza L. Eleftherios Dudenhoeffer, [place] Care Management Assistant

## 2015-12-23 LAB — CUP PACEART REMOTE DEVICE CHECK: Date Time Interrogation Session: 20170118130533

## 2015-12-26 ENCOUNTER — Telehealth: Payer: Self-pay | Admitting: Cardiology

## 2015-12-26 NOTE — Telephone Encounter (Signed)
Pt called in and stated his loop recorder "feels weird". He stated that when he moves a certain way he feels a tingle in the area the loop recorder is at. Informed pt that I would send a message to one of the nurses and have her call him back.

## 2015-12-26 NOTE — Telephone Encounter (Signed)
Spoke with Mr. Buckalew. He occasionally feels as if the device is tingling like a battery going bad under his skin. His transmission from this morning shows the battery status: OK. He reports that the skin is intact over the device and without redness, swelling or drainage. He denies fever and chills. I have advised him to call back if any of those symptoms develop or if this tingling sensation becomes worse/more frequent. He verbalizes understanding and is appreciative.

## 2015-12-30 ENCOUNTER — Ambulatory Visit (INDEPENDENT_AMBULATORY_CARE_PROVIDER_SITE_OTHER): Payer: Commercial Managed Care - HMO | Admitting: General Practice

## 2015-12-30 DIAGNOSIS — Z5181 Encounter for therapeutic drug level monitoring: Secondary | ICD-10-CM | POA: Diagnosis not present

## 2015-12-30 DIAGNOSIS — I4891 Unspecified atrial fibrillation: Secondary | ICD-10-CM

## 2015-12-30 LAB — POCT INR: INR: 3.1

## 2015-12-30 NOTE — Progress Notes (Signed)
I have reviewed and agree with the plan. 

## 2015-12-30 NOTE — Progress Notes (Signed)
Pre visit review using our clinic review tool, if applicable. No additional management support is needed unless otherwise documented below in the visit note. 

## 2016-01-02 ENCOUNTER — Ambulatory Visit (INDEPENDENT_AMBULATORY_CARE_PROVIDER_SITE_OTHER): Payer: Commercial Managed Care - HMO | Admitting: *Deleted

## 2016-01-02 DIAGNOSIS — R55 Syncope and collapse: Secondary | ICD-10-CM

## 2016-01-03 NOTE — Progress Notes (Signed)
Carelink Summary Report / Loop Recorder 

## 2016-01-10 ENCOUNTER — Other Ambulatory Visit: Payer: Self-pay | Admitting: Pharmacist

## 2016-01-10 NOTE — Patient Outreach (Signed)
[place] [facility]) Care Management  01/10/2016  CHINO FELTZ [birth year] [id number]   Lo Roston is a 60yo who I am following for medication assistance.  I made outreach call to [place] Patient Assistance Program to follow up on patient's application for Bystolic and Linzess patient assistance programs.  I spoke to [place] who confirms that patient's application has been received and processed, but it is missing information.  Chelsea states they need to know patient's copay amount for Linzess and Bystolic.  Using [email or web address].biz/, I was able to pull up patient's insurance information and provide copay amount to [place].  Chelsea reports she will send patient's application to the processing department to be reprocessed which will take approximately 5 business days.  If approved, it will take approximately 7 to 10 business days for the medication to be mailed to patient's PCP and then patient will need to go pick up the medication from the PCP office.    I made outreach call to patient to update him on this information.  Patient voices understanding.  Patient reports he has 12 tablets of Bystolic remaining (8 day supply).  With patient's permission, I called [facility] and spoke to [facility] who confirms patient has a prescription for Bystolic on file.  Patient agrees to fill Bystolic prescription until [employer] application is processed.  Patient confirms he is able to afford copay price of Bystolic at this time.    Patient also inquired about re-applying for Advair patient assistance program.  Patient was approved for Advair patient assistance in 2016 and reports he is on his last inhaler.  I reviewed with patient that Advair patient assistance program requires patients to spend $600 out of pocket in the current calendar year before they are approved for Advair patient assistance.  Patient voices understanding and confirms he has not spent $600 out of pocket this calendar year.  Reviewed importance of  setting aside money next month for Advair copay and patient voiced understanding.  I advised patient to reach out to me if he has spent $600 out of pocket on prescription medications in the current calendar year and I can assist him with Advair patient assistance application.      Plan:  Patient will go [facility]st[facility].  I will call [place] Patient Assistance Program in one week to check on patient's application and will continue to update patient on status of patient assistance program.     Elisabeth Most, Pharm.D. Pharmacy Resident [place] [phone]

## 2016-01-11 DIAGNOSIS — G4733 Obstructive sleep apnea (adult) (pediatric): Secondary | ICD-10-CM | POA: Diagnosis not present

## 2016-01-12 ENCOUNTER — Telehealth: Payer: Self-pay | Admitting: Internal Medicine

## 2016-01-12 ENCOUNTER — Other Ambulatory Visit: Payer: Self-pay | Admitting: Pharmacist

## 2016-01-12 NOTE — Telephone Encounter (Signed)
Pt states the manufacturer will be sending his medication, Bystolic and Linzess to [place].  Can you please give him a call when it gets here.

## 2016-01-13 ENCOUNTER — Encounter: Payer: Self-pay | Admitting: Pharmacist

## 2016-01-13 NOTE — Patient Outreach (Signed)
[facility]) Care Management  [place]   01/13/2016  [name] 1956-03-29 [id number]  Subjective: [name] is a 60yo who was referred to [place] for medication assistance.  I received a phone call from patient who reports he received notification from [place] patient assistance program that he was approved to receive Bystolic and Linzess through the patient assistance program.   Objective:   Current Medications: Current Outpatient Prescriptions  Medication Sig Dispense Refill  . ADVAIR DISKUS 250-50 MCG/DOSE AEPB INHALE 1 PUFF EVERY DAY AS DIRECTED 60 each 3  . albuterol (PROVENTIL HFA;VENTOLIN HFA) 108 (90 BASE) MCG/ACT inhaler Inhale 2 puffs into the lungs 4 times a day as needed    . ALPRAZolam (XANAX) 0.5 MG tablet TAKE 1 TABLET BY MOUTH TWICE A DAY AS NEEDED FOR ANXIETY 60 tablet 2  . carisoprodol (SOMA) 350 MG tablet Take 1 tablet (350 mg total) by mouth 3 (three) times daily as needed. 90 tablet 2  . diltiazem (CARDIZEM CD) 120 MG 24 hr capsule Take 1 capsule (120 mg total) by mouth daily. 90 capsule 1  . doxepin (SINEQUAN) 25 MG capsule Take 1 capsule (25 mg total) by mouth 2 (two) times daily. 90 capsule 1  . fluocinonide cream (LIDEX) 4.69 % Apply 1 application topically 2 (two) times daily. 30 g 0  . hydrocortisone 2.5 % cream Apply topically 2 (two) times daily as needed. 453.6 g 1  . hydrOXYzine (ATARAX/VISTARIL) 25 MG tablet Take 25 mg by mouth 3 (three) times daily as needed. Reported on 11/15/2015    . ibuprofen (ADVIL,MOTRIN) 200 MG tablet Take 400 mg by mouth 2 (two) times daily.    [name] KLOR-CON M20 20 MEQ tablet TAKE 2 TABLETS BY MOUTH EVERY DAY 30 tablet 11  . levothyroxine (SYNTHROID) 25 MCG tablet Take 1 tablet (25 mcg total) by mouth daily before breakfast. 30 tablet 1  . Linaclotide (LINZESS) 145 MCG CAPS capsule Take 145 mcg by mouth daily as needed (Crampy pain).     . Loratadine 10 MG CAPS Take 10 mg by mouth daily.     .  nebivolol (BYSTOLIC) 5 MG tablet Take 1.5 tablets (7.5 mg total) by mouth daily. 90 tablet 3  . omeprazole (PRILOSEC) 20 MG capsule Take 1 capsule (20 mg total) by mouth daily with supper. 90 capsule 1  . sertraline (ZOLOFT) 100 MG tablet Take 1 tablet (100 mg total) by mouth daily. 90 tablet 1  . silver sulfADIAZINE (SILVADENE) 1 % cream Apply 1 application topically daily. 50 g 0  . tolnaftate (TINACTIN) 1 % powder Apply 1 application topically 2 (two) times daily. Apply to Groin area    . torsemide (DEMADEX) 20 MG tablet Take 2 tablets (40 mg total) by mouth 2 (two) times daily. 180 tablet 1  . traZODone (DESYREL) 100 MG tablet Take 1/2 tablet (50 mg) by mouth once daily    . triamcinolone cream (KENALOG) 0.1 % Apply 1 application topically 2 (two) times daily. 30 g 0  . warfarin (COUMADIN) 5 MG tablet Take as directed by anticoagulation clinic 30 tablet 3  . witch hazel-glycerin (TUCKS) pad Apply 1 application topically as needed for itching, irritation or hemorrhoids.     [name] zolpidem (AMBIEN) 10 MG tablet TAKE 1 TABLET BY MOUTH AT BEDTIME 30 tablet 5  . [DISCONTINUED] atorvastatin (LIPITOR) 20 MG tablet Take 1 tablet (20 mg total) by mouth daily. 30 tablet 11  . [DISCONTINUED] gabapentin (NEURONTIN) 300 MG  capsule Take 1 capsule (300 mg total) by mouth 3 (three) times daily. 90 capsule 5   No current facility-administered medications for this visit.    Functional Status: In your present state of health, do you have any difficulty performing the following activities: 11/15/2015 09/01/2015  Hearing? N N  Vision? Y Y  Difficulty concentrating or making decisions? N N  Walking or climbing stairs? Y Y  Dressing or bathing? N N  Doing errands, shopping? N N  Preparing Food and eating ? N -  Using the Toilet? N -  In the past six months, have you accidently leaked urine? N -  Do you have problems with loss of bowel control? N -  Managing your Medications? N -  Managing your Finances? Y -   Housekeeping or managing your Housekeeping? N -    Fall/Depression Screening: PHQ 2/9 Scores 11/15/2015 04/25/2015 04/11/2015 02/23/2015 02/16/2014  PHQ - 2 Score 2 0 0 0 1  PHQ- 9 Score 13 - - - -  Exception Documentation - Patient refusal - - -    Assessment: 1.  Medication assistance:  Patient has enrolled in [facility] mail order pharmacy to assist in reducing overall medication cost (tier 1 and 2 medications are zero copay).  In addition, patient has been approved to receive Bystolic and Linzess through the [place] patient assistance program.  Patient states the medications are being mailed to his PCP's office and he plans to contact his PCP to arrange to pick up the medications.     Plan: 1.  Patient to continue to take all medications as prescribed.  2.  Will close pharmacy program as goals have been met and patient was approved for [employer] patient assistance for Linzess and Bystolic.  Will alert [place] Management Assistant, Lurline Del, of case closure.  Will send patient and provider closure letters.     THN CM Care Plan Problem One        Most Recent Value   Care Plan Problem One  Medication assistance   Role Documenting the Problem One  Clinical Pharmacist   Care Plan for Problem One  Active   THN Long Term Goal (31-90 days)  Patient will explore options to reduce medication cost in the next 31 days including sending prescriptions to [facility] mail order pharmacy and applying for patient assistance programs and Extra Help.    THN Long Term Goal Start Date  11/23/15   [facility] Long Term Goal Met Date  01/13/16   Interventions for Problem One Long Term Goal  Patient has now enrolled in [place] mail order pharmacy and was approved to get Bystolic and Linzess through the patient assistance program.       Elisabeth Most, Pharm.D. Pharmacy Resident [place] [phone]

## 2016-01-18 ENCOUNTER — Other Ambulatory Visit: Payer: Self-pay | Admitting: Pharmacist

## 2016-01-18 NOTE — Patient Outreach (Signed)
[facility]) Care Management  01/18/2016  KAMEL DUNMAN Feb 10, 1956 [id number]   Larrell Frediani is a 60yo who was previously referred to [place] for medication assistance.  I received a voicemail from patient with questions about a letter he received form [employer] patient assistance program.  I made outreach call to patient.  Patient reports he received a letter in the mail stating his application was missing information (ie copay amount of prescription).  I asked Mr. Diallo to clarify the date the letter was sent to him.  Letter was dated January 04, 2016.  I informed Mr. Sellman that since that letter was mailed, the information requested has been provided to the [place] patient assistance program (01/10/16) and he received a phone call letting him know he was approved for [employer] patient assistance program (01/12/16).  I informed Mr. Wiltgen that if he wants to check on the status of his application and find out more about when prescription will be delivered to his PCP's office he can call phone number on the [place] patient assistance letter [phone]).  Advised patient to call me if he has any further questions or concerns regarding his medications.     Elisabeth Most, Pharm.D. Pharmacy Resident [place] [phone]

## 2016-01-22 ENCOUNTER — Other Ambulatory Visit: Payer: Self-pay | Admitting: Internal Medicine

## 2016-01-23 ENCOUNTER — Other Ambulatory Visit: Payer: Self-pay | Admitting: Pharmacist

## 2016-01-23 ENCOUNTER — Other Ambulatory Visit: Payer: Self-pay | Admitting: Internal Medicine

## 2016-01-23 NOTE — Patient Outreach (Signed)
[facility]) Care Management  01/23/2016  [name] Oct 05, 1956 [id number]   [name] is a 60yo who was previously referred to [place] for medication assistance.  I received a phone call from patient regarding [employer] patient assistance for Bystolic and Linzess.  Patient reports he has not heard anything else regarding the delivery of his medications.  Patient previously reported getting a phone call from [place] reporting he was approved for both Bystolic and Linzess.  Patient states he has called [employer] several times, waited on hold for 30 minutes, but has not been able to speak to anyone regarding his application.    I made a phone call to [place] patient assistance while patient was on the phone and spoke to [place].   [name] confirms they have patient's application and have all the required information but [name] reports application is still being processed.  [name] reports application should be processed within 3 to 4 business days.  Brand reports both patient and provider will get a letter from [place] once application is processed.  [name] also states patient can call back again to check on the status of the application at the end of the week.    I advised patient to call [employer] at the end of the week to check on the status of his application.  Patient denies any other concerns at this time and confirms he has all medications including both Bystolic and Linzess.  I will follow up with patient in one week if I have not heard from him.     Elisabeth Most, Pharm.D. Pharmacy Resident [place] [phone]

## 2016-01-24 NOTE — Telephone Encounter (Signed)
Please advise can we refill this medication

## 2016-01-24 NOTE — Telephone Encounter (Signed)
Medication has been sent to pharmacy.  °

## 2016-01-24 NOTE — Telephone Encounter (Signed)
Done hardcopy to Corinne  

## 2016-01-28 ENCOUNTER — Other Ambulatory Visit: Payer: Self-pay | Admitting: Internal Medicine

## 2016-01-30 ENCOUNTER — Other Ambulatory Visit: Payer: Self-pay | Admitting: Pharmacist

## 2016-01-30 NOTE — Patient Outreach (Signed)
[facility]) Care Management  [place]   01/30/2016  [name] [birth year] [id number]  Subjective: [name] is a 60yo who was referred to [place] for medication assistance.  I made a phone call to [name] and identified that patient was denied for patient assistance for Bystolic and Linzess.  I made outreach call to patient to update him on this information.    Objective:   Encounter Medications: Outpatient Encounter Prescriptions as of 01/30/2016  Medication Sig Note  . ADVAIR DISKUS 250-50 MCG/DOSE AEPB INHALE 1 PUFF EVERY DAY AS DIRECTED   . albuterol (PROVENTIL HFA;VENTOLIN HFA) 108 (90 BASE) MCG/ACT inhaler Inhale 2 puffs into the lungs 4 times a day as needed   . ALPRAZolam (XANAX) 0.5 MG tablet TAKE 1 TABLET BY MOUTH TWICE A DAY AS NEEDED   . carisoprodol (SOMA) 350 MG tablet Take 1 tablet (350 mg total) by mouth 3 (three) times daily as needed.   . diltiazem (CARDIZEM CD) 120 MG 24 hr capsule Take 1 capsule (120 mg total) by mouth daily.   [name] doxepin (SINEQUAN) 25 MG capsule Take 1 capsule (25 mg total) by mouth 2 (two) times daily.   . fluocinonide cream (LIDEX) [id number] % Apply 1 application topically 2 (two) times daily. 11/30/2015: Patient has not received from mail order pharmacy yet  . hydrocortisone 2.5 % cream Apply topically 2 (two) times daily as needed.   . hydrocortisone 2.5 % cream APPLY TOPICALLY TWICE A DAY   . hydrOXYzine (ATARAX/VISTARIL) 25 MG tablet Take 25 mg by mouth 3 (three) times daily as needed. Reported on 11/15/2015   . ibuprofen (ADVIL,MOTRIN) 200 MG tablet Take 400 mg by mouth 2 (two) times daily.   [name] KLOR-CON M20 20 MEQ tablet TAKE 2 TABLETS BY MOUTH EVERY DAY   . levothyroxine (SYNTHROID) 25 MCG tablet Take 1 tablet (25 mcg total) by mouth daily before breakfast.   . Linaclotide (LINZESS) 145 MCG CAPS capsule Take 145 mcg by mouth daily as needed (Crampy pain).    . Loratadine 10 MG CAPS  Take 10 mg by mouth daily.  10/01/2014: -  . nebivolol (BYSTOLIC) 5 MG tablet Take 1.5 tablets (7.5 mg total) by mouth daily.   [name] omeprazole (PRILOSEC) 20 MG capsule Take 1 capsule (20 mg total) by mouth daily with supper.   . sertraline (ZOLOFT) 100 MG tablet Take 1 tablet (100 mg total) by mouth daily.   . silver sulfADIAZINE (SILVADENE) 1 % cream Apply 1 application topically daily.   [name] tolnaftate (TINACTIN) 1 % powder Apply 1 application topically 2 (two) times daily. Apply to Groin area   . torsemide (DEMADEX) 20 MG tablet Take 2 tablets (40 mg total) by mouth 2 (two) times daily.   . traZODone (DESYREL) 100 MG tablet Take 1/2 tablet (50 mg) by mouth once daily   . triamcinolone cream (KENALOG) 0.1 % Apply 1 application topically 2 (two) times daily.   [name] warfarin (COUMADIN) 5 MG tablet Take as directed by anticoagulation clinic 11/15/2015: Takes a half tablet on MWF, Takes a whole tablet on T, TH, Sat, Sun  . witch hazel-glycerin (TUCKS) pad Apply 1 application topically as needed for itching, irritation or hemorrhoids.  10/01/2014: -  . zolpidem (AMBIEN) 10 MG tablet TAKE 1 TABLET BY MOUTH AT BEDTIME    No facility-administered encounter medications on file as of 01/30/2016.    Functional Status: In your present state of health, do you have  any difficulty performing the following activities: 11/15/2015 09/01/2015  Hearing? N N  Vision? Y Y  Difficulty concentrating or making decisions? N N  Walking or climbing stairs? Y Y  Dressing or bathing? N N  Doing errands, shopping? N N  Preparing Food and eating ? N -  Using the Toilet? N -  In the past six months, have you accidently leaked urine? N -  Do you have problems with loss of bowel control? N -  Managing your Medications? N -  Managing your Finances? Y -  Housekeeping or managing your Housekeeping? N -    Fall/Depression Screening: PHQ 2/9 Scores 11/15/2015 04/25/2015 04/11/2015 02/23/2015 02/16/2014  PHQ - 2 Score 2 0 0 0 1  PHQ-  9 Score 13 - - - -  Exception Documentation - Patient refusal - - -    Assessment: 1.  Medication assistance:  Patient has enrolled in [name] mail order pharmacy to assist in reducing overall medication cost (tier 1 and 2 medications are zero copay).  Patient also receives 25% Extra Help for his medications through social security.  I assisted patient in applying for [employer] patient assistance program for Bystolic and Linzess; however, received confirmation today that patient does not qualify.  I informed patient that he did not qualify for patient assistance program.  Patient voices understanding and thanked me for helping him try to get additional assistance.  Given that patient has Extra Help already, there are limited medication assistance resources available.  Patient confirms he has all medications at this time and reports he will continue to pay for medication copays.  Will send a message to patient's provider to determine if patient can be switched to a generic beta-blocker to assist in reducing medication cost.    Plan: 1.  I will send a message to patient's provider requesting Bystolic be switched to a generic beta-blocker if clinically appropriate to assist in reducing medication cost.  If medication is changed, please consider switching to carvedilol, metoprolol succiante, or bisoprolol which are the approved formulations for heart failure.   2.  Patient to continue to take all medications as prescribed.   Patient denies further pharmacy needs at this time.   3.  Will close pharmacy program as no further intervention needed.  Patient has Pharmacist phone number and advised patient to call should a need arise in the future.   Elisabeth Most, Pharm.D. Pharmacy Resident [place] [phone]

## 2016-01-31 ENCOUNTER — Encounter: Payer: Self-pay | Admitting: Pharmacist

## 2016-01-31 ENCOUNTER — Ambulatory Visit (INDEPENDENT_AMBULATORY_CARE_PROVIDER_SITE_OTHER): Payer: Commercial Managed Care - HMO | Admitting: *Deleted

## 2016-01-31 DIAGNOSIS — R55 Syncope and collapse: Secondary | ICD-10-CM

## 2016-01-31 NOTE — Progress Notes (Signed)
Carelink Summary Report / Loop Recorder 

## 2016-02-01 DIAGNOSIS — E119 Type 2 diabetes mellitus without complications: Secondary | ICD-10-CM | POA: Diagnosis not present

## 2016-02-01 DIAGNOSIS — R21 Rash and other nonspecific skin eruption: Secondary | ICD-10-CM | POA: Diagnosis not present

## 2016-02-01 DIAGNOSIS — I110 Hypertensive heart disease with heart failure: Secondary | ICD-10-CM | POA: Diagnosis not present

## 2016-02-01 DIAGNOSIS — L739 Follicular disorder, unspecified: Secondary | ICD-10-CM | POA: Diagnosis not present

## 2016-02-01 DIAGNOSIS — I509 Heart failure, unspecified: Secondary | ICD-10-CM | POA: Diagnosis not present

## 2016-02-01 DIAGNOSIS — L821 Other seborrheic keratosis: Secondary | ICD-10-CM | POA: Diagnosis not present

## 2016-02-01 DIAGNOSIS — J449 Chronic obstructive pulmonary disease, unspecified: Secondary | ICD-10-CM | POA: Diagnosis not present

## 2016-02-01 DIAGNOSIS — L258 Unspecified contact dermatitis due to other agents: Secondary | ICD-10-CM | POA: Diagnosis not present

## 2016-02-01 DIAGNOSIS — I4891 Unspecified atrial fibrillation: Secondary | ICD-10-CM | POA: Diagnosis not present

## 2016-02-01 DIAGNOSIS — I878 Other specified disorders of veins: Secondary | ICD-10-CM | POA: Diagnosis not present

## 2016-02-03 ENCOUNTER — Ambulatory Visit (INDEPENDENT_AMBULATORY_CARE_PROVIDER_SITE_OTHER): Payer: Commercial Managed Care - HMO | Admitting: General Practice

## 2016-02-03 DIAGNOSIS — I4891 Unspecified atrial fibrillation: Secondary | ICD-10-CM

## 2016-02-03 DIAGNOSIS — Z5181 Encounter for therapeutic drug level monitoring: Secondary | ICD-10-CM | POA: Diagnosis not present

## 2016-02-03 LAB — POCT INR: INR: 1.9

## 2016-02-03 NOTE — Progress Notes (Signed)
Pre visit review using our clinic review tool, if applicable. No additional management support is needed unless otherwise documented below in the visit note. 

## 2016-02-03 NOTE — Progress Notes (Signed)
I have reviewed and agree with the plan. 

## 2016-02-06 ENCOUNTER — Other Ambulatory Visit: Payer: Self-pay | Admitting: Internal Medicine

## 2016-02-06 LAB — CUP PACEART REMOTE DEVICE CHECK: Date Time Interrogation Session: 20170217130600

## 2016-02-06 NOTE — Progress Notes (Signed)
Carelink summary report received. Battery status OK. Normal device function. No new symptom episodes, tachy episodes, brady, or pause episodes. 69 AF 98.9% +warfarin. Monthly summary reports and ROV/PRN

## 2016-02-07 NOTE — Telephone Encounter (Signed)
Please advise 

## 2016-02-07 NOTE — Telephone Encounter (Signed)
Medication sent to pharmacy  

## 2016-02-07 NOTE — Telephone Encounter (Signed)
Done hardcopy to Corinne  

## 2016-02-10 ENCOUNTER — Other Ambulatory Visit: Payer: Commercial Managed Care - HMO

## 2016-02-10 ENCOUNTER — Other Ambulatory Visit (INDEPENDENT_AMBULATORY_CARE_PROVIDER_SITE_OTHER): Payer: Commercial Managed Care - HMO

## 2016-02-10 DIAGNOSIS — R7989 Other specified abnormal findings of blood chemistry: Secondary | ICD-10-CM | POA: Diagnosis not present

## 2016-02-10 LAB — T4, FREE: Free T4: 0.56 ng/dL — ABNORMAL LOW (ref 0.60–1.60)

## 2016-02-10 LAB — TSH: TSH: 2.56 u[IU]/mL (ref 0.35–4.50)

## 2016-02-11 DIAGNOSIS — G4733 Obstructive sleep apnea (adult) (pediatric): Secondary | ICD-10-CM | POA: Diagnosis not present

## 2016-02-13 ENCOUNTER — Other Ambulatory Visit: Payer: Commercial Managed Care - HMO

## 2016-02-13 DIAGNOSIS — J449 Chronic obstructive pulmonary disease, unspecified: Secondary | ICD-10-CM | POA: Diagnosis not present

## 2016-02-13 DIAGNOSIS — E119 Type 2 diabetes mellitus without complications: Secondary | ICD-10-CM | POA: Diagnosis not present

## 2016-02-13 DIAGNOSIS — J45909 Unspecified asthma, uncomplicated: Secondary | ICD-10-CM | POA: Diagnosis not present

## 2016-02-13 DIAGNOSIS — L821 Other seborrheic keratosis: Secondary | ICD-10-CM | POA: Diagnosis not present

## 2016-02-13 DIAGNOSIS — Z87891 Personal history of nicotine dependence: Secondary | ICD-10-CM | POA: Diagnosis not present

## 2016-02-13 DIAGNOSIS — I10 Essential (primary) hypertension: Secondary | ICD-10-CM | POA: Diagnosis not present

## 2016-02-13 DIAGNOSIS — L308 Other specified dermatitis: Secondary | ICD-10-CM | POA: Diagnosis not present

## 2016-02-16 ENCOUNTER — Encounter: Payer: Self-pay | Admitting: Endocrinology

## 2016-02-16 ENCOUNTER — Ambulatory Visit (INDEPENDENT_AMBULATORY_CARE_PROVIDER_SITE_OTHER): Payer: Commercial Managed Care - HMO | Admitting: Endocrinology

## 2016-02-16 VITALS — BP 142/86 | HR 70 | Temp 98.5°F | Ht 70.0 in | Wt 310.0 lb

## 2016-02-16 DIAGNOSIS — E038 Other specified hypothyroidism: Secondary | ICD-10-CM | POA: Diagnosis not present

## 2016-02-16 MED ORDER — LEVOTHYROXINE SODIUM 25 MCG PO TABS: 25.0000 ug | ORAL_TABLET | Freq: Every day | ORAL | Status: DC

## 2016-02-16 NOTE — Progress Notes (Signed)
Patient ID: [name], male   DOB: 04-Oct-1956, 60 y.o.   MRN: [id number]                                                                                                               Reason for Appointment:   follow-up for thyroid  Referring physician: [name]   History of Present Illness:   Prior to his consultation the patient  had symptoms of palpitations, shakiness, feeling excessively warm and sweaty, and fatigue.  Because of his taking amiodarone for 3 years he has had periodic monitoring of his thyroid levels and was found to be hyperthyroid in 6/16 His amiodarone was stopped in June 2016 He was started on methimazole and the dose was increased to 20 mg twice a day; his free T4 was high at 2.7 in 8/16 Although he did not come back for follow-up and had no other thyroid labs he continued methimazole on his own until 1/17  Even without methimazole in 2/17 his TSH was significantly higher at 9.2 with a low free T4 of 0.5 He has been on levothyroxine 25 g although he does not think it helps his energy level Still complains of feeling cold at times  His weight fluctuates based on his fluid status  He was told to continue his levothyroxine 25 g but he says he ran out a few days ago, not sure when.  Does not think he feels any worse  His thyroid levels are showing a decline in free T4 again   Wt Readings from Last 3 Encounters:  02/16/16 310 lb (140.615 kg)  12/19/15 323 lb (146.512 kg)  12/12/15 317 lb (143.79 kg)    Lab Results  Component Value Date   TSH 2.56 02/10/2016   TSH 3.26 12/16/2015   TSH 2.60 11/29/2015   FREET4 0.56* 02/10/2016   FREET4 0.76 12/16/2015   FREET4 0.50* 11/16/2015          Medication List       This list is accurate as of: 02/16/16 11:59 PM.  Always use your most recent med list.               ADVAIR DISKUS 250-50 MCG/DOSE Aepb  Generic drug:  Fluticasone-Salmeterol  INHALE 1 PUFF EVERY DAY AS DIRECTED     albuterol 108  (90 Base) MCG/ACT inhaler  Commonly known as:  PROVENTIL HFA;VENTOLIN HFA  Inhale 2 puffs into the lungs 4 times a day as needed     ALPRAZolam 0.5 MG tablet  Commonly known as:  XANAX  TAKE 1 TABLET BY MOUTH TWICE A DAY AS NEEDED     carisoprodol 350 MG tablet  Commonly known as:  SOMA  TAKE 1 TABLET BY MOUTH 3 TIMES A DAY IN THE MORNING AS NEEDED     diltiazem 120 MG 24 hr capsule  Commonly known as:  CARDIZEM CD  Take 1 capsule (120 mg total) by mouth daily.     doxepin 25 MG capsule  Commonly known as:  SINEQUAN  Take 1 capsule (25 mg total) by mouth 2 (two) times daily.     fluocinonide cream 0.05 %  Commonly known as:  LIDEX  Apply 1 application topically 2 (two) times daily.     hydrocortisone 2.5 % cream  Apply topically 2 (two) times daily as needed.     hydrOXYzine 25 MG tablet  Commonly known as:  ATARAX/VISTARIL  Take 25 mg by mouth 3 (three) times daily as needed. Reported on 11/15/2015     ibuprofen 200 MG tablet  Commonly known as:  ADVIL,MOTRIN  Take 400 mg by mouth 2 (two) times daily.     KLOR-CON M20 20 MEQ tablet  Generic drug:  potassium chloride SA  TAKE 2 TABLETS BY MOUTH EVERY DAY     levothyroxine 25 MCG tablet  Commonly known as:  SYNTHROID  Take 1 tablet (25 mcg total) by mouth daily before breakfast.     linaclotide 145 MCG Caps capsule  Commonly known as:  LINZESS  Take 145 mcg by mouth daily as needed (Crampy pain).     Loratadine 10 MG Caps  Take 10 mg by mouth daily. Reported on 02/16/2016     nebivolol 5 MG tablet  Commonly known as:  BYSTOLIC  Take 1.5 tablets (7.5 mg total) by mouth daily.     omeprazole 20 MG capsule  Commonly known as:  PRILOSEC  Take 1 capsule (20 mg total) by mouth daily with supper.     sertraline 100 MG tablet  Commonly known as:  ZOLOFT  Take 1 tablet (100 mg total) by mouth daily.     silver sulfADIAZINE 1 % cream  Commonly known as:  SILVADENE  Apply 1 application topically daily.      tolnaftate 1 % powder  Commonly known as:  TINACTIN  Apply 1 application topically 2 (two) times daily. Apply to Groin area     torsemide 20 MG tablet  Commonly known as:  DEMADEX  Take 2 tablets (40 mg total) by mouth 2 (two) times daily.     traZODone 100 MG tablet  Commonly known as:  DESYREL  Take 1/2 tablet (50 mg) by mouth once daily     triamcinolone cream 0.1 %  Commonly known as:  KENALOG  Apply 1 application topically 2 (two) times daily.     warfarin 5 MG tablet  Commonly known as:  COUMADIN  Take as directed by anticoagulation clinic     witch hazel-glycerin pad  Commonly known as:  TUCKS  Apply 1 application topically as needed for itching, irritation or hemorrhoids. Reported on 02/16/2016     zolpidem 10 MG tablet  Commonly known as:  AMBIEN  TAKE 1 TABLET BY MOUTH AT BEDTIME            Past Medical History  Diagnosis Date  . DYSKINESIA, ESOPHAGUS   . FIBROMYALGIA   . HYPERLIPIDEMIA     a. Intolerant to statins.  . INSOMNIA     takes Ambien nightly  . Rash and other nonspecific skin eruption 04/12/2009    no cause found saw dermatologists x 2 and allergist  . Paroxysmal atrial fibrillation ([place])     a. CHA2DS2VASc = 3--> takes Coumadin;  b. 03/15/2015 Successful TEE/DCCV;  c. 03/2015 recurrent afib, Amio d/c'd in setting of hyperthyroidism.  . Cardiomyopathy ([place])     a. EF 25% TEE July 2013; b. EF normalized 2015;  c. 03/2015 Echo: EF 40-45%, difrf HK, PASP 38 mmHg, Mild MR, sev LAE/RAE.  [name]  Myocardial infarction [facility])     a. 2012 Myoview notable for prior infarct;  b. 03/2015 Lexiscan CL: EF 37%, diff HK, small area of inferior infarct from apex to base-->Med Rx.  . ANXIETY     takes Xanax nightly  . ASTHMA     Albuterol prn and Advair daily;also takes Prednisone daily  . GERD (gastroesophageal reflux disease)        . Type II diabetes mellitus ([place])        . Essential hypertension        . COPD (chronic obstructive pulmonary disease) ([place])   . O2  dependent     uses oxygen 2 and 1/2 liters per [place] at hs  . SLEEP APNEA, OBSTRUCTIVE     a. doesn't use CPAP  . Peripheral neuropathy ([name])   . Arthritis   . Chronic constipation     takes OTC stool softener  . Diverticulitis   . Glaucoma   . DEPRESSION     takes Zoloft and Doxepin daily  . Anemia     supposed to be taking Vit B but doesn't  . Syncope     a. 03/2015 s/p MDT LINQ.  [name] Atrial fibrillation ([place]) 09/06/2015    Past Surgical History  Procedure Laterality Date  . Tonsillectomy    . Acne cyst removal      2 on back   . Cardioversion  04/18/2012    Procedure: CARDIOVERSION;  Surgeon: Fay Records, MD;  Location: [place];  Service: Cardiovascular;  Laterality: N/A;  . Tee without cardioversion  04/25/2012    Procedure: TRANSESOPHAGEAL ECHOCARDIOGRAM (TEE);  Surgeon: Thayer Headings, MD;  Location: [place];  Service: Cardiovascular;  Laterality: N/A;  . Cardioversion  04/25/2012    Procedure: CARDIOVERSION;  Surgeon: Thayer Headings, MD;  Location[facility] ENDOSCOPY;  Service: Cardiovascular;  Laterality: N/A;  . Cardioversion  04/25/2012    Procedure: CARDIOVERSION;  Surgeon: Fay Records, MD;  Location: [place];  Service: Cardiovascular;  Laterality: N/A;  . Cardioversion  05/09/2012    Procedure: CARDIOVERSION;  Surgeon: Sherren Mocha, MD;  Location: [place];  Service: Cardiovascular;  Laterality: N/A;  changed from crenshaw to cooper by trish/leone-endo  . Colonoscopy    . Esophagogastroduodenoscopy    . Total knee arthroplasty Right 06/15/2014    Procedure: TOTAL KNEE ARTHROPLASTY;  Surgeon: Renette Butters, MD;  Location: [place];  Service: Orthopedics;  Laterality: Right;  . Colonoscopy with propofol N/A 10/21/2014    Procedure: COLONOSCOPY WITH PROPOFOL;  Surgeon: Ladene Artist, MD;  Location: [facility] ENDOSCOPY;  Service: Endoscopy;  Laterality: N/A;  . Tee without cardioversion N/A 03/15/2015    Procedure: TRANSESOPHAGEAL ECHOCARDIOGRAM (TEE);  Surgeon: Thayer Headings, MD;   Location: [place];  Service: Cardiovascular;  Laterality: N/A;  . Cardioversion N/A 03/15/2015    Procedure: CARDIOVERSION;  Surgeon: Thayer Headings, MD;  Location: [place];  Service: Cardiovascular;  Laterality: N/A;  . Ep implantable device N/A 04/06/2015    Procedure: Loop Recorder Insertion;  Surgeon: Evans Lance, MD;  Location: [place] CV LAB;  Service: Cardiovascular;  Laterality: N/A;    Family History  Problem Relation Age of Onset  . COPD Mother   . Asthma Mother   . Colon polyps Mother   . Allergies Mother   . Hypothyroidism Mother   . Asthma Maternal Grandmother   . Colon cancer Neg Hx     Social History:  reports that he quit smoking about  8 years ago. His smoking use included Cigarettes. He has a 60 pack-year smoking history. He has never used smokeless tobacco. He reports that he does not drink alcohol or use illicit drugs.  Allergies:  Allergies  Allergen Reactions  . Pravastatin Sodium Other (See Comments)    myalgia  . Simvastatin Other (See Comments)    severe leg cramps  . Statins Other (See Comments)    myalgia  . Amiodarone     hyperthyroidism  . Tape Other (See Comments)    Skin Tears Use Paper Tape Only    Review of Systems:  Review of Systems  Cardiovascular: Positive for palpitations and leg swelling.       Atrial fib since 2001      He had been diagnosed to have mild diabetes but blood sugars have been controlled in 2016, has not followed up for this with PCP   Lab Results  Component Value Date   HGBA1C 5.8 11/29/2015     Examination:   BP 142/86 mmHg  Pulse 70  Temp(Src) 98.5 F (36.9 C) (Oral)  Ht 5\' 10"  (1.778 m)  Wt 310 lb (140.615 kg)  BMI 44.48 kg/m2  SpO2 93%   His reflexes at biceps are difficult to elicit but appear normal    Assessment/Plan:  Probable mild secondary hypothyroidism with persistently low free T4 level:  Since he continues to have a free T4 that was below normal without taking  levothyroxine will have him restart the 25 g supplement Will adjust the dose further on his follow-up visit  Multinodular goiter on exams  History of hyperthyroidism from long-term use of amiodarone, resolved with stopping amiodarone      Sharita Bienaime 02/17/2016, 8:22 AM

## 2016-02-17 ENCOUNTER — Other Ambulatory Visit: Payer: Self-pay | Admitting: Internal Medicine

## 2016-02-17 NOTE — Telephone Encounter (Signed)
Please advise 

## 2016-02-23 ENCOUNTER — Other Ambulatory Visit: Payer: Self-pay | Admitting: Internal Medicine

## 2016-03-01 ENCOUNTER — Ambulatory Visit (INDEPENDENT_AMBULATORY_CARE_PROVIDER_SITE_OTHER): Payer: Commercial Managed Care - HMO | Admitting: *Deleted

## 2016-03-01 DIAGNOSIS — R55 Syncope and collapse: Secondary | ICD-10-CM

## 2016-03-02 ENCOUNTER — Ambulatory Visit: Payer: Commercial Managed Care - HMO

## 2016-03-02 NOTE — Progress Notes (Signed)
Carelink Summary Report / Loop Recorder 

## 2016-03-07 ENCOUNTER — Ambulatory Visit (INDEPENDENT_AMBULATORY_CARE_PROVIDER_SITE_OTHER): Payer: Commercial Managed Care - HMO | Admitting: General Practice

## 2016-03-07 DIAGNOSIS — I4891 Unspecified atrial fibrillation: Secondary | ICD-10-CM

## 2016-03-07 DIAGNOSIS — Z5181 Encounter for therapeutic drug level monitoring: Secondary | ICD-10-CM | POA: Diagnosis not present

## 2016-03-07 LAB — POCT INR: INR: 2.7

## 2016-03-07 NOTE — Progress Notes (Signed)
I have reviewed and agree with the plan. 

## 2016-03-07 NOTE — Progress Notes (Signed)
Pre visit review using our clinic review tool, if applicable. No additional management support is needed unless otherwise documented below in the visit note. 

## 2016-03-09 LAB — CUP PACEART REMOTE DEVICE CHECK: Date Time Interrogation Session: 20170319130707

## 2016-03-12 ENCOUNTER — Other Ambulatory Visit: Payer: Self-pay | Admitting: Internal Medicine

## 2016-03-12 DIAGNOSIS — G4733 Obstructive sleep apnea (adult) (pediatric): Secondary | ICD-10-CM | POA: Diagnosis not present

## 2016-03-12 LAB — CUP PACEART REMOTE DEVICE CHECK: MDC IDC SESS DTM: 20170418130723

## 2016-03-12 NOTE — Progress Notes (Signed)
Carelink summary report received. Battery status OK. Normal device function. No new symptom episodes, tachy episodes, brady, or pause episodes. 99.3% AF, v rates controlled, +warfarin.  Monthly summary reports and ROV/PRN

## 2016-03-15 ENCOUNTER — Encounter: Payer: Self-pay | Admitting: Internal Medicine

## 2016-03-15 ENCOUNTER — Ambulatory Visit (INDEPENDENT_AMBULATORY_CARE_PROVIDER_SITE_OTHER): Payer: Commercial Managed Care - HMO | Admitting: Internal Medicine

## 2016-03-15 VITALS — BP 126/72 | HR 75 | Ht 70.0 in | Wt 309.6 lb

## 2016-03-15 DIAGNOSIS — I428 Other cardiomyopathies: Secondary | ICD-10-CM

## 2016-03-15 DIAGNOSIS — I429 Cardiomyopathy, unspecified: Secondary | ICD-10-CM | POA: Diagnosis not present

## 2016-03-15 DIAGNOSIS — I255 Ischemic cardiomyopathy: Secondary | ICD-10-CM | POA: Diagnosis not present

## 2016-03-15 DIAGNOSIS — I481 Persistent atrial fibrillation: Secondary | ICD-10-CM | POA: Diagnosis not present

## 2016-03-15 DIAGNOSIS — I4819 Other persistent atrial fibrillation: Secondary | ICD-10-CM

## 2016-03-15 LAB — CUP PACEART INCLINIC DEVICE CHECK: Date Time Interrogation Session: 20170601175604

## 2016-03-15 MED ORDER — DILTIAZEM HCL ER COATED BEADS 120 MG PO CP24: 120.0000 mg | ORAL_CAPSULE | Freq: Every day | ORAL | Status: DC

## 2016-03-15 MED ORDER — TORSEMIDE 20 MG PO TABS: 60.0000 mg | ORAL_TABLET | Freq: Two times a day (BID) | ORAL | Status: DC

## 2016-03-15 NOTE — Progress Notes (Signed)
Patient Care Team: [name], MD as PCP - [place], RD as Dietitian   HPI  [name] is a 60 y.o. male   seen in followup for atrial fibrillation for which he is treated with amiodarone.  There was some concern regarding dyspnea so he underwent pulmonary function testing by Dr. Gwenette Greet   Echocardiogram 7/13 demonstrated EF 20-25% with severe biatrial enlargement. Repeat echo 8/14 and demonstrated an intercurrent normalization of LV function with biatrial enlargement and mild RV dilatation    Myoview 2012 demonstrated an ejection fraction of 44% with a prior inferior wall infarct Myoview 2015 demonstrated an ejection fraction 57% with an inferior wall perfusion defect question bowel  DATE TEST     /2012 myoview   EF44 Prior IMI perfsion defect  /2015 myoview   EF57% Perfusion defect--MI vs bowel  2016 myoview 35-40 NO perfusion defect  6/16 Echo 40-45 Severe BAE   Antiarrhythmics Date  dofetilide 2013  amidoarone 2016     He is a problems with worsening shortness of breath. He also now describes chest tightness. There is bilateral peripheral edema. There is ongoing and significant fatigue. He has diagnosed sleep apnea but has been intolerant of CPAP therapy. He is on nocturnal oxygen. I wonder whether an oral appliance would be of benefit. He previously saw Dr. Gwenette Greet.  He had right bundle branch block  6/16 admitted to hospital following episodes of syncope and underwent ILR insertion            Past Medical History  Diagnosis Date  . DYSKINESIA, ESOPHAGUS   . FIBROMYALGIA   . HYPERLIPIDEMIA     a. Intolerant to statins.  . INSOMNIA     takes Ambien nightly  . Rash and other nonspecific skin eruption 04/12/2009    no cause found saw dermatologists x 2 and allergist  . Paroxysmal atrial fibrillation ([place])     a. CHA2DS2VASc = 3--> takes Coumadin;  b. 03/15/2015 Successful TEE/DCCV;  c. 03/2015 recurrent afib, Amio d/c'd in setting of  hyperthyroidism.  . Cardiomyopathy ([place])     a. EF 25% TEE July 2013; b. EF normalized 2015;  c. 03/2015 Echo: EF 40-45%, difrf HK, PASP 38 mmHg, Mild MR, sev LAE/RAE.  [name] Myocardial infarction [facility])     a. 2012 Myoview notable for prior infarct;  b. 03/2015 Lexiscan CL: EF 37%, diff HK, small area of inferior infarct from apex to base-->Med Rx.  . ANXIETY     takes Xanax nightly  . ASTHMA     Albuterol prn and Advair daily;also takes Prednisone daily  . GERD (gastroesophageal reflux disease)        . Type II diabetes mellitus ([place])        . Essential hypertension        . COPD (chronic obstructive pulmonary disease) ([place])   . O2 dependent     uses oxygen 2 and 1/2 liters per [place] at hs  . SLEEP APNEA, OBSTRUCTIVE     a. doesn't use CPAP  . Peripheral neuropathy ([place])   . Arthritis   . Chronic constipation     takes OTC stool softener  . Diverticulitis   . Glaucoma   . DEPRESSION     takes Zoloft and Doxepin daily  . Anemia     supposed to be taking Vit B but doesn't  . Syncope     a. 03/2015 s/p MDT LINQ.  [name] Atrial fibrillation ([place]) 09/06/2015  Past Surgical History  Procedure Laterality Date  . Tonsillectomy    . Acne cyst removal      2 on back   . Cardioversion  04/18/2012    Procedure: CARDIOVERSION;  Surgeon: Fay Records, MD;  Location: [place];  Service: Cardiovascular;  Laterality: N/A;  . Tee without cardioversion  04/25/2012    Procedure: TRANSESOPHAGEAL ECHOCARDIOGRAM (TEE);  Surgeon: Thayer Headings, MD;  Location: [place];  Service: Cardiovascular;  Laterality: N/A;  . Cardioversion  04/25/2012    Procedure: CARDIOVERSION;  Surgeon: Thayer Headings, MD;  Location: [facility] ENDOSCOPY;  Service: Cardiovascular;  Laterality: N/A;  . Cardioversion  04/25/2012    Procedure: CARDIOVERSION;  Surgeon: Fay Records, MD;  Location: [place];  Service: Cardiovascular;  Laterality: N/A;  . Cardioversion  05/09/2012    Procedure: CARDIOVERSION;  Surgeon: Sherren Mocha, MD;   Location: [place];  Service: Cardiovascular;  Laterality: N/A;  changed from crenshaw to cooper by trish/leone-endo  . Colonoscopy    . Esophagogastroduodenoscopy    . Total knee arthroplasty Right 06/15/2014    Procedure: TOTAL KNEE ARTHROPLASTY;  Surgeon: Renette Butters, MD;  Location: [place];  Service: Orthopedics;  Laterality: Right;  . Colonoscopy with propofol N/A 10/21/2014    Procedure: COLONOSCOPY WITH PROPOFOL;  Surgeon: Ladene Artist, MD;  [facility] ENDOSCOPY;  Service: Endoscopy;  Laterality: N/A;  . Tee without cardioversion N/A 03/15/2015    Procedure: TRANSESOPHAGEAL ECHOCARDIOGRAM (TEE);  Surgeon: Thayer Headings, MD;  Location: [place];  Service: Cardiovascular;  Laterality: N/A;  . Cardioversion N/A 03/15/2015    Procedure: CARDIOVERSION;  Surgeon: Thayer Headings, MD;  Location: [place];  Service: Cardiovascular;  Laterality: N/A;  . Ep implantable device N/A 04/06/2015    Procedure: Loop Recorder Insertion;  Surgeon: Evans Lance, MD;  Location: [place] CV LAB;  Service: Cardiovascular;  Laterality: N/A;    Current Outpatient Prescriptions  Medication Sig Dispense Refill  . ADVAIR DISKUS 250-50 MCG/DOSE AEPB INHALE 1 PUFF EVERY DAY AS DIRECTED 60 each 3  . albuterol (PROVENTIL HFA;VENTOLIN HFA) 108 (90 BASE) MCG/ACT inhaler Inhale 2 puffs into the lungs 4 times a day as needed    . ALPRAZolam (XANAX) 0.5 MG tablet Take 0.5 mg by mouth 2 (two) times daily as needed for anxiety.    . carisoprodol (SOMA) 350 MG tablet Take 1 tablet by mouth three times a day as needed in the morning for muscle spasms    . diltiazem (CARDIZEM CD) 120 MG 24 hr capsule Take 1 capsule (120 mg total) by mouth daily. 90 capsule 1  . diphenhydrAMINE (BENADRYL) 50 MG capsule TAKE ONE CAPSULE BY MOUTH EVERY 6 HOURS AS NEEDED FOR ITCHING 30 capsule 0  . doxepin (SINEQUAN) 25 MG capsule Take 1 capsule (25 mg total) by mouth 2 (two) times daily. 90 capsule 1  . fluocinonide cream (LIDEX)  [id number] % Apply 1 application topically 2 (two) times daily. 30 g 0  . hydrocortisone 2.5 % cream Apply topically 2 (two) times daily as needed. 453.6 g 1  . hydrOXYzine (ATARAX/VISTARIL) 25 MG tablet TAKE 1 TABLET BY MOUTH 3 TIMES A DAY FOR ITCHING 30 tablet 1  . ibuprofen (ADVIL,MOTRIN) 200 MG tablet Take 400 mg by mouth 2 (two) times daily.    [name] KLOR-CON M20 20 MEQ tablet TAKE 2 TABLETS BY MOUTH EVERY DAY 30 tablet 11  . levothyroxine (SYNTHROID) 25 MCG tablet Take 1 tablet (25 mcg total) by mouth  daily before breakfast. 30 tablet 3  . Linaclotide (LINZESS) 145 MCG CAPS capsule Take 145 mcg by mouth daily as needed (Crampy pain).     . Loratadine 10 MG CAPS Take 10 mg by mouth daily. Reported on 02/16/2016    . nebivolol (BYSTOLIC) 5 MG tablet Take 5 mg by mouth daily.    [name] omeprazole (PRILOSEC) 20 MG capsule Take 1 capsule (20 mg total) by mouth daily with supper. 90 capsule 1  . sertraline (ZOLOFT) 100 MG tablet TAKE 2 TABLETS BY MOUTH AT BEDTIME 180 tablet 3  . silver sulfADIAZINE (SILVADENE) 1 % cream Apply 1 application topically daily. 50 g 0  . tolnaftate (TINACTIN) 1 % powder Apply 1 application topically 2 (two) times daily. Apply to Groin area    . torsemide (DEMADEX) 20 MG tablet Take 2 tablets (40 mg total) by mouth 2 (two) times daily. 180 tablet 1  . traZODone (DESYREL) 100 MG tablet Take 1/2 tablet (50 mg) by mouth once daily    . triamcinolone cream (KENALOG) 0.1 % Apply 1 application topically 2 (two) times daily. 30 g 0  . warfarin (COUMADIN) 5 MG tablet Take as directed by anticoagulation clinic 30 tablet 3  . witch hazel-glycerin (TUCKS) pad Apply 1 application topically daily as needed for itching, irritation or hemorrhoids. Reported on 02/16/2016    . zolpidem (AMBIEN) 10 MG tablet TAKE 1 TABLET BY MOUTH AT BEDTIME 30 tablet 5  . [DISCONTINUED] atorvastatin (LIPITOR) 20 MG tablet Take 1 tablet (20 mg total) by mouth daily. 30 tablet 11  . [DISCONTINUED] gabapentin (NEURONTIN)  300 MG capsule Take 1 capsule (300 mg total) by mouth 3 (three) times daily. 90 capsule 5   No current facility-administered medications for this visit.    Allergies  Allergen Reactions  . Pravastatin Sodium Other (See Comments)    myalgia  . Simvastatin Other (See Comments)    severe leg cramps  . Statins Other (See Comments)    myalgia  . Amiodarone     hyperthyroidism  . Tape Other (See Comments)    Skin Tears Use Paper Tape Only    Review of Systems negative except from HPI and PMH  Physical Exam BP 126/72 mmHg  Pulse 75  Ht 5\' 10"  (1.778 m)  Wt 309 lb 9.6 oz (140.434 kg)  BMI 44.42 kg/m2 Well developed andmorbidly obese in no acute distress HENT normal E scleral and icterus clear JVP not discernible Neck Supple Clear to ausculation Irregularly  irregular rate and rhythm, no murmurs gallops or rub Soft with active bowel sounds No clubbing cyanosis  1+Edema Alert and oriented, grossly normal motor and sensory function Affect flat Skin Warm and Dry  ECG from 11/16 was reviewed. There is right bundle left posterior fascicular block and rates ranging on this tracing from 150--75   Assessment and  Plan  Ischemic heart disease with prior MI by Myoview  HFrEF   Cardiomyopathy-ischemic/nonischemic-    Atrial fibrillation persistent/permanent  Hyperthyroidism >>euthyroid     He has progressive symptoms of shortness of breath. He is volume overloaded. He has obstructive sleep apnea but has not been tolerant of CPAP therapy and likely has some degree of both primary and secondary pulmonary hypertension. His Myoview was abnormal before; we discussed proceeding with catheterization. Given his size I would like to do it with a right heart catheterization from his neck; hence, we will arrange for her to be done by the heart failure team  The issue of cardiomyopathy  in the context of his atrial fibrillation takes the issue of rate control. His length is provocative in  that it suggests heart rates in the 200s on a daily basis and average heart rates in the high 70s-80s. To give better assessment of this will undertake a 48 hour Holter monitor as if there would be no intervention based on the catheterization and heart rates are in fact fast, I would lean towards AV junction ablation and CRT pacing.  Clearly, his obesity hypoventilation syndrome contributes significantly to his exercise intolerance and is untreated sleep apnea aggravates it as well. I just don't know how to get past his otherwise seemingly unmodifiable issues

## 2016-03-15 NOTE — Patient Instructions (Addendum)
Medication Instructions:  Your physician has recommended you make the following change in your medication: Increase torsemide to 60 mg by mouth twice daily.    Labwork: None--will be arranged prior to catheterization  Testing/Procedures: Your physician has recommended that you wear a holter monitor. Holter monitors are medical devices that record the heart's electrical activity. Doctors most often use these monitors to diagnose arrhythmias. Arrhythmias are problems with the speed or rhythm of the heartbeat. The monitor is a small, portable device. You can wear one while you do your normal daily activities. This is usually used to diagnose what is causing palpitations/syncope (passing out). We will call you to schedule this.   Your physician has requested that you have a cardiac catheterization. Cardiac catheterization is used to diagnose and/or treat various heart conditions. Doctors may recommend this procedure for a number of different reasons. The most common reason is to evaluate chest pain. Chest pain can be a symptom of coronary artery disease (CAD), and cardiac catheterization can show whether plaque is narrowing or blocking your heart's arteries. This procedure is also used to evaluate the valves, as well as measure the blood flow and oxygen levels in different parts of your heart. For further information please visit [email or web address]. Please follow instruction sheet, as given. We will call you to schedule this.   Follow-Up:  Your physician recommends that you schedule a follow-up appointment in: about 4 weeks.    Any Other Special Instructions Will Be Listed Below (If Applicable).     If you need a refill on your cardiac medications before your next appointment, please call your pharmacy.

## 2016-03-16 ENCOUNTER — Other Ambulatory Visit: Payer: Self-pay | Admitting: Internal Medicine

## 2016-03-16 ENCOUNTER — Other Ambulatory Visit: Payer: Self-pay | Admitting: *Deleted

## 2016-03-16 DIAGNOSIS — R071 Chest pain on breathing: Secondary | ICD-10-CM

## 2016-03-16 DIAGNOSIS — R0602 Shortness of breath: Secondary | ICD-10-CM

## 2016-03-16 DIAGNOSIS — Z01812 Encounter for preprocedural laboratory examination: Secondary | ICD-10-CM

## 2016-03-16 DIAGNOSIS — I255 Ischemic cardiomyopathy: Secondary | ICD-10-CM

## 2016-03-19 ENCOUNTER — Other Ambulatory Visit (INDEPENDENT_AMBULATORY_CARE_PROVIDER_SITE_OTHER): Payer: Commercial Managed Care - HMO | Admitting: *Deleted

## 2016-03-19 ENCOUNTER — Encounter: Payer: Self-pay | Admitting: *Deleted

## 2016-03-19 DIAGNOSIS — I2589 Other forms of chronic ischemic heart disease: Secondary | ICD-10-CM | POA: Diagnosis not present

## 2016-03-19 DIAGNOSIS — I255 Ischemic cardiomyopathy: Secondary | ICD-10-CM

## 2016-03-19 DIAGNOSIS — R0602 Shortness of breath: Secondary | ICD-10-CM

## 2016-03-19 DIAGNOSIS — Z01812 Encounter for preprocedural laboratory examination: Secondary | ICD-10-CM

## 2016-03-19 LAB — CBC WITH DIFFERENTIAL/PLATELET
BASOS ABS: 65 {cells}/uL (ref 0–200)
Basophils Relative: 1 %
EOS ABS: 130 {cells}/uL (ref 15–500)
Eosinophils Relative: 2 %
HCT: 40.7 % (ref 38.5–50.0)
Hemoglobin: 13.1 g/dL — ABNORMAL LOW (ref 13.2–17.1)
LYMPHS PCT: 32 %
Lymphs Abs: 2080 cells/uL (ref 850–3900)
MCH: 28.5 pg (ref 27.0–33.0)
MCHC: 32.2 g/dL (ref 32.0–36.0)
MCV: 88.5 fL (ref 80.0–100.0)
MONOS PCT: 11 %
MPV: 10.3 fL (ref 7.5–12.5)
Monocytes Absolute: 715 cells/uL (ref 200–950)
Neutro Abs: 3510 cells/uL (ref 1500–7800)
Neutrophils Relative %: 54 %
PLATELETS: 216 10*3/uL (ref 140–400)
RBC: 4.6 MIL/uL (ref 4.20–5.80)
RDW: 15.7 % — AB (ref 11.0–15.0)
WBC: 6.5 10*3/uL (ref 3.8–10.8)

## 2016-03-19 LAB — BASIC METABOLIC PANEL
BUN: 24 mg/dL (ref 7–25)
CHLORIDE: 102 mmol/L (ref 98–110)
CO2: 23 mmol/L (ref 20–31)
CREATININE: 0.92 mg/dL (ref 0.70–1.25)
Calcium: 8.7 mg/dL (ref 8.6–10.3)
Glucose, Bld: 77 mg/dL (ref 65–99)
Potassium: 4.6 mmol/L (ref 3.5–5.3)
Sodium: 137 mmol/L (ref 135–146)

## 2016-03-19 LAB — PROTIME-INR
INR: 1.65 — ABNORMAL HIGH (ref <1.50)
Prothrombin Time: 19.7 seconds — ABNORMAL HIGH (ref 11.6–15.2)

## 2016-03-21 ENCOUNTER — Other Ambulatory Visit: Payer: Self-pay | Admitting: Internal Medicine

## 2016-03-21 ENCOUNTER — Ambulatory Visit (HOSPITAL_COMMUNITY)
Admission: RE | Admit: 2016-03-21 | Discharge: 2016-03-21 | Disposition: A | Discharge status: Home / Self Care | Payer: Commercial Managed Care - HMO | Source: Ambulatory Visit | Attending: Cardiology | Admitting: Cardiology

## 2016-03-21 ENCOUNTER — Encounter (HOSPITAL_COMMUNITY)
Admission: RE | Disposition: A | Discharge status: Home / Self Care | Payer: Self-pay | Source: Ambulatory Visit | Attending: Cardiology

## 2016-03-21 DIAGNOSIS — I4510 Unspecified right bundle-branch block: Secondary | ICD-10-CM | POA: Diagnosis not present

## 2016-03-21 DIAGNOSIS — E662 Morbid (severe) obesity with alveolar hypoventilation: Secondary | ICD-10-CM | POA: Diagnosis not present

## 2016-03-21 DIAGNOSIS — I2510 Atherosclerotic heart disease of native coronary artery without angina pectoris: Secondary | ICD-10-CM | POA: Insufficient documentation

## 2016-03-21 DIAGNOSIS — E785 Hyperlipidemia, unspecified: Secondary | ICD-10-CM | POA: Diagnosis not present

## 2016-03-21 DIAGNOSIS — F329 Major depressive disorder, single episode, unspecified: Secondary | ICD-10-CM | POA: Diagnosis not present

## 2016-03-21 DIAGNOSIS — K219 Gastro-esophageal reflux disease without esophagitis: Secondary | ICD-10-CM | POA: Diagnosis not present

## 2016-03-21 DIAGNOSIS — G4700 Insomnia, unspecified: Secondary | ICD-10-CM | POA: Insufficient documentation

## 2016-03-21 DIAGNOSIS — F419 Anxiety disorder, unspecified: Secondary | ICD-10-CM | POA: Insufficient documentation

## 2016-03-21 DIAGNOSIS — I255 Ischemic cardiomyopathy: Secondary | ICD-10-CM | POA: Diagnosis not present

## 2016-03-21 DIAGNOSIS — I480 Paroxysmal atrial fibrillation: Secondary | ICD-10-CM | POA: Insufficient documentation

## 2016-03-21 DIAGNOSIS — I252 Old myocardial infarction: Secondary | ICD-10-CM | POA: Diagnosis not present

## 2016-03-21 DIAGNOSIS — R071 Chest pain on breathing: Secondary | ICD-10-CM

## 2016-03-21 DIAGNOSIS — M1990 Unspecified osteoarthritis, unspecified site: Secondary | ICD-10-CM | POA: Insufficient documentation

## 2016-03-21 DIAGNOSIS — D649 Anemia, unspecified: Secondary | ICD-10-CM | POA: Diagnosis not present

## 2016-03-21 DIAGNOSIS — I5022 Chronic systolic (congestive) heart failure: Secondary | ICD-10-CM | POA: Diagnosis not present

## 2016-03-21 DIAGNOSIS — H409 Unspecified glaucoma: Secondary | ICD-10-CM | POA: Diagnosis not present

## 2016-03-21 DIAGNOSIS — E1142 Type 2 diabetes mellitus with diabetic polyneuropathy: Secondary | ICD-10-CM | POA: Insufficient documentation

## 2016-03-21 DIAGNOSIS — Z7901 Long term (current) use of anticoagulants: Secondary | ICD-10-CM | POA: Diagnosis not present

## 2016-03-21 DIAGNOSIS — I110 Hypertensive heart disease with heart failure: Secondary | ICD-10-CM | POA: Diagnosis not present

## 2016-03-21 DIAGNOSIS — Z6841 Body Mass Index (BMI) 40.0 and over, adult: Secondary | ICD-10-CM | POA: Diagnosis not present

## 2016-03-21 DIAGNOSIS — I429 Cardiomyopathy, unspecified: Secondary | ICD-10-CM | POA: Diagnosis not present

## 2016-03-21 DIAGNOSIS — J449 Chronic obstructive pulmonary disease, unspecified: Secondary | ICD-10-CM | POA: Diagnosis not present

## 2016-03-21 DIAGNOSIS — K5909 Other constipation: Secondary | ICD-10-CM | POA: Insufficient documentation

## 2016-03-21 DIAGNOSIS — Z9981 Dependence on supplemental oxygen: Secondary | ICD-10-CM | POA: Diagnosis not present

## 2016-03-21 DIAGNOSIS — M797 Fibromyalgia: Secondary | ICD-10-CM | POA: Diagnosis not present

## 2016-03-21 HISTORY — PX: CARDIAC CATHETERIZATION: SHX172

## 2016-03-21 LAB — POCT I-STAT 3, VENOUS BLOOD GAS (G3P V)
ACID-BASE DEFICIT: 1 mmol/L (ref 0.0–2.0)
BICARBONATE: 25.3 meq/L — AB (ref 20.0–24.0)
BICARBONATE: 26.2 meq/L — AB (ref 20.0–24.0)
O2 SAT: 60 %
O2 SAT: 62 %
PCO2 VEN: 47.8 mmHg (ref 45.0–50.0)
PH VEN: 7.334 — AB (ref 7.250–7.300)
PO2 VEN: 35 mmHg (ref 31.0–45.0)
TCO2: 27 mmol/L (ref 0–100)
TCO2: 28 mmol/L (ref 0–100)
pCO2, Ven: 49.3 mmHg (ref 45.0–50.0)
pH, Ven: 7.331 — ABNORMAL HIGH (ref 7.250–7.300)
pO2, Ven: 34 mmHg (ref 31.0–45.0)

## 2016-03-21 LAB — PROTIME-INR
INR: 1.33 (ref 0.00–1.49)
PROTHROMBIN TIME: 16.6 s — AB (ref 11.6–15.2)

## 2016-03-21 SURGERY — RIGHT/LEFT HEART CATH AND CORONARY ANGIOGRAPHY
Anesthesia: LOCAL

## 2016-03-21 MED ORDER — FENTANYL CITRATE (PF) 100 MCG/2ML IJ SOLN
INTRAMUSCULAR | Status: AC
  Filled 2016-03-21: qty 2

## 2016-03-21 MED ORDER — SODIUM CHLORIDE 0.9 % WEIGHT BASED INFUSION: 3.0000 mL/kg/h | INTRAVENOUS | Status: DC

## 2016-03-21 MED ORDER — SODIUM CHLORIDE 0.9% FLUSH: 3.0000 mL | Freq: Two times a day (BID) | INTRAVENOUS | Status: DC

## 2016-03-21 MED ORDER — SODIUM CHLORIDE 0.9 % IV SOLN: 250.0000 mL | INTRAVENOUS | Status: DC | PRN

## 2016-03-21 MED ORDER — SODIUM CHLORIDE 0.9 % WEIGHT BASED INFUSION: 1.0000 mL/kg/h | INTRAVENOUS | Status: DC

## 2016-03-21 MED ORDER — SODIUM CHLORIDE 0.9 % WEIGHT BASED INFUSION
3.0000 mL/kg/h | INTRAVENOUS | Status: DC
Start: 2016-03-22 — End: 2016-03-21

## 2016-03-21 MED ORDER — SODIUM CHLORIDE 0.9% FLUSH: 3.0000 mL | INTRAVENOUS | Status: DC | PRN

## 2016-03-21 MED ORDER — VERAPAMIL HCL 2.5 MG/ML IV SOLN
INTRAVENOUS | Status: AC
  Filled 2016-03-21: qty 2

## 2016-03-21 MED ORDER — SODIUM CHLORIDE 0.9 % IV SOLN
INTRAVENOUS | Status: DC
  Administered 2016-03-21: 09:00:00 via INTRAVENOUS

## 2016-03-21 MED ORDER — IOPAMIDOL (ISOVUE-370) INJECTION 76%
INTRAVENOUS | Status: AC
  Filled 2016-03-21: qty 100

## 2016-03-21 MED ORDER — VERAPAMIL HCL 2.5 MG/ML IV SOLN
INTRAVENOUS | Status: DC | PRN
  Administered 2016-03-21: 11:00:00 via INTRA_ARTERIAL

## 2016-03-21 MED ORDER — FENTANYL CITRATE (PF) 100 MCG/2ML IJ SOLN
INTRAMUSCULAR | Status: DC | PRN
  Administered 2016-03-21 (×2): 25 ug via INTRAVENOUS

## 2016-03-21 MED ORDER — HEPARIN SODIUM (PORCINE) 1000 UNIT/ML IJ SOLN
INTRAMUSCULAR | Status: DC | PRN
  Administered 2016-03-21: 5500 [IU] via INTRAVENOUS

## 2016-03-21 MED ORDER — MIDAZOLAM HCL 2 MG/2ML IJ SOLN
INTRAMUSCULAR | Status: DC | PRN
  Administered 2016-03-21 (×2): 1 mg via INTRAVENOUS

## 2016-03-21 MED ORDER — HEPARIN (PORCINE) IN NACL 2-0.9 UNIT/ML-% IJ SOLN
INTRAMUSCULAR | Status: DC | PRN
  Administered 2016-03-21: 1000 mL

## 2016-03-21 MED ORDER — MIDAZOLAM HCL 2 MG/2ML IJ SOLN
INTRAMUSCULAR | Status: AC
  Filled 2016-03-21: qty 2

## 2016-03-21 MED ORDER — HEPARIN (PORCINE) IN NACL 2-0.9 UNIT/ML-% IJ SOLN
INTRAMUSCULAR | Status: AC
  Filled 2016-03-21: qty 500

## 2016-03-21 MED ORDER — ONDANSETRON HCL 4 MG/2ML IJ SOLN: 4.0000 mg | Freq: Four times a day (QID) | INTRAMUSCULAR | Status: DC | PRN

## 2016-03-21 MED ORDER — ACETAMINOPHEN 325 MG PO TABS: 650.0000 mg | ORAL_TABLET | ORAL | Status: DC | PRN

## 2016-03-21 MED ORDER — LIDOCAINE HCL (PF) 1 % IJ SOLN
INTRAMUSCULAR | Status: DC | PRN
  Administered 2016-03-21: 8 mL via SUBCUTANEOUS

## 2016-03-21 MED ORDER — IOPAMIDOL (ISOVUE-370) INJECTION 76%
INTRAVENOUS | Status: DC | PRN
  Administered 2016-03-21: 80 mL via INTRAVENOUS

## 2016-03-21 MED ORDER — ASPIRIN 81 MG PO CHEW
81.0000 mg | CHEWABLE_TABLET | ORAL | Status: AC
  Administered 2016-03-21: 81 mg via ORAL

## 2016-03-21 MED ORDER — ASPIRIN 81 MG PO CHEW
CHEWABLE_TABLET | ORAL | Status: AC
  Administered 2016-03-21: 81 mg via ORAL
  Filled 2016-03-21: qty 1

## 2016-03-21 MED ORDER — LIDOCAINE HCL (PF) 1 % IJ SOLN
INTRAMUSCULAR | Status: AC
  Filled 2016-03-21: qty 30

## 2016-03-21 SURGICAL SUPPLY — 16 items
CATH BALLN WEDGE 5F 110CM (CATHETERS) ×1 IMPLANT
CATH INFINITI 5 FR JL3.5 (CATHETERS) ×1 IMPLANT
CATH INFINITI 5FR ANG PIGTAIL (CATHETERS) ×1 IMPLANT
CATH INFINITI JR4 5F (CATHETERS) ×1 IMPLANT
DEVICE RAD COMP TR BAND LRG (VASCULAR PRODUCTS) ×1 IMPLANT
GLIDESHEATH SLEND SS 6F .021 (SHEATH) ×2 IMPLANT
GUIDEWIRE .025 260CM (WIRE) ×1 IMPLANT
KIT HEART LEFT (KITS) ×2 IMPLANT
KIT HEART RIGHT NAMIC (KITS) ×1 IMPLANT
PACK CARDIAC CATHETERIZATION (CUSTOM PROCEDURE TRAY) ×2 IMPLANT
SHEATH FAST CATH BRACH 5F 5CM (SHEATH) ×1 IMPLANT
SHEATH PINNACLE 5F 10CM (SHEATH) ×1 IMPLANT
SYR MEDRAD MARK V 150ML (SYRINGE) ×2 IMPLANT
TRANSDUCER W/STOPCOCK (MISCELLANEOUS) ×3 IMPLANT
TUBING CIL FLEX 10 FLL-RA (TUBING) ×2 IMPLANT
WIRE SAFE-T 1.5MM-J .035X260CM (WIRE) ×1 IMPLANT

## 2016-03-21 NOTE — Discharge Instructions (Signed)
Radial Site Care °Refer to this sheet in the next few weeks. These instructions provide you with information about caring for yourself after your procedure. Your health care provider may also give you more specific instructions. Your treatment has been planned according to current medical practices, but problems sometimes occur. Call your health care provider if you have any problems or questions after your procedure. °WHAT TO EXPECT AFTER THE PROCEDURE °After your procedure, it is typical to have the following: °· Bruising at the radial site that usually fades within 1-2 weeks. °· Blood collecting in the tissue (hematoma) that may be painful to the touch. It should usually decrease in size and tenderness within 1-2 weeks. °HOME CARE INSTRUCTIONS °· Take medicines only as directed by your health care provider. °· You may shower 24-48 hours after the procedure or as directed by your health care provider. Remove the bandage (dressing) and gently wash the site with plain soap and water. Pat the area dry with a clean towel. Do not rub the site, because this may cause bleeding. °· Do not take baths, swim, or use a hot tub until your health care provider approves. °· Check your insertion site every day for redness, swelling, or drainage. °· Do not apply powder or lotion to the site. °· Do not flex or bend the affected arm for 24 hours or as directed by your health care provider. °· Do not push or pull heavy objects with the affected arm for 24 hours or as directed by your health care provider. °· Do not lift over 10 lb (4.5 kg) for 5 days after your procedure or as directed by your health care provider. °· Ask your health care provider when it is okay to: °¨ Return to work or school. °¨ Resume usual physical activities or sports. °¨ Resume sexual activity. °· Do not drive home if you are discharged the same day as the procedure. Have someone else drive you. °· You may drive 24 hours after the procedure unless otherwise  instructed by your health care provider. °· Do not operate machinery or power tools for 24 hours after the procedure. °· If your procedure was done as an outpatient procedure, which means that you went home the same day as your procedure, a responsible adult should be with you for the first 24 hours after you arrive home. °· Keep all follow-up visits as directed by your health care provider. This is important. °SEEK MEDICAL CARE IF: °· You have a fever. °· You have chills. °· You have increased bleeding from the radial site. Hold pressure on the site. °SEEK IMMEDIATE MEDICAL CARE IF: °· You have unusual pain at the radial site. °· You have redness, warmth, or swelling at the radial site. °· You have drainage (other than a small amount of blood on the dressing) from the radial site. °· The radial site is bleeding, and the bleeding does not stop after 30 minutes of holding steady pressure on the site. °· Your arm or hand becomes pale, cool, tingly, or numb. °  °This information is not intended to replace advice given to you by your health care provider. Make sure you discuss any questions you have with your health care provider. °  °Document Released: 11/03/2010 Document Revised: 10/22/2014 Document Reviewed: 04/19/2014 °Elsevier Interactive Patient Education ©2016 [employer] ° °

## 2016-03-21 NOTE — Procedures (Deleted)
Electrical Cardioversion Procedure Note [name] [id number] 07-08-1956  Procedure: Electrical Cardioversion Indications:  Atrial Flutter  Procedure Details Consent: Risks of procedure as well as the alternatives and risks of each were explained to the (patient/caregiver).  Consent for procedure obtained. Time Out: Verified patient identification, verified procedure, site/side was marked, verified correct patient position, special equipment/implants available, medications/allergies/relevent history reviewed, required imaging and test results available.  Performed  Patient placed on cardiac monitor, pulse oximetry, supplemental oxygen as necessary.  Sedation given: Propofol per anesthesiology Pacer pads placed anterior and posterior chest.  Cardioverted 1 time(s).  Cardioverted at 150J.  Evaluation Findings: Post procedure EKG shows: NSR Complications: None Patient did tolerate procedure well.   [name] 03/21/2016, 11:45 AM

## 2016-03-21 NOTE — Research (Signed)
[facility] Research Study Informed Consent   Subject Name: [name]  Subject met inclusion and exclusion criteria.  The informed consent form, study requirements and expectations were reviewed with the subject and questions and concerns were addressed prior to the signing of the consent form.  The subject verbalized understanding of the trial requirements.  The subject agreed to participate in the  trial and signed the informed consent.  The informed consent was obtained prior to performance of any protocol-specific procedures for the subject.  A copy of the signed informed consent was given to the subject and a copy was placed in the subject's medical record. Subject will be enrolled if angiographic criteria met.  Blossom Hoops 03/21/2016, 9:42 AM

## 2016-03-21 NOTE — Interval H&P Note (Signed)
History and Physical Interval Note:  03/21/2016 10:38 AM  [name]  has presented today for surgery, with the diagnosis of ischemic cardiomyopathy, sob  The various methods of treatment have been discussed with the patient and family. After consideration of risks, benefits and other options for treatment, the patient has consented to  Procedure(s): Right/Left Heart Cath and Coronary Angiography (N/A) as a surgical intervention .  The patient's history has been reviewed, patient examined, no change in status, stable for surgery.  I have reviewed the patient's chart and labs.  Questions were answered to the patient's satisfaction.     Tanaja Ganger [employer]

## 2016-03-21 NOTE — H&P (View-Only) (Signed)
Patient Care Team: [name], MD as PCP - [place], RD as Dietitian   HPI  [name] is a 60 y.o. male   seen in followup for atrial fibrillation for which he is treated with amiodarone.  There was some concern regarding dyspnea so he underwent pulmonary function testing by Dr. Gwenette Greet   Echocardiogram 7/13 demonstrated EF 20-25% with severe biatrial enlargement. Repeat echo 8/14 and demonstrated an intercurrent normalization of LV function with biatrial enlargement and mild RV dilatation    Myoview 2012 demonstrated an ejection fraction of 44% with a prior inferior wall infarct Myoview 2015 demonstrated an ejection fraction 57% with an inferior wall perfusion defect question bowel  DATE TEST     /2012 myoview   EF44 Prior IMI perfsion defect  /2015 myoview   EF57% Perfusion defect--MI vs bowel  2016 myoview 35-40 NO perfusion defect  6/16 Echo 40-45 Severe BAE   Antiarrhythmics Date  dofetilide 2013  amidoarone 2016     He is a problems with worsening shortness of breath. He also now describes chest tightness. There is bilateral peripheral edema. There is ongoing and significant fatigue. He has diagnosed sleep apnea but has been intolerant of CPAP therapy. He is on nocturnal oxygen. I wonder whether an oral appliance would be of benefit. He previously saw Dr. Gwenette Greet.  He had right bundle branch block  6/16 admitted to hospital following episodes of syncope and underwent ILR insertion            Past Medical History  Diagnosis Date  . DYSKINESIA, ESOPHAGUS   . FIBROMYALGIA   . HYPERLIPIDEMIA     a. Intolerant to statins.  . INSOMNIA     takes Ambien nightly  . Rash and other nonspecific skin eruption 04/12/2009    no cause found saw dermatologists x 2 and allergist  . Paroxysmal atrial fibrillation ([place])     a. CHA2DS2VASc = 3--> takes Coumadin;  b. 03/15/2015 Successful TEE/DCCV;  c. 03/2015 recurrent afib, Amio d/c'd in setting of  hyperthyroidism.  . Cardiomyopathy ([place])     a. EF 25% TEE July 2013; b. EF normalized 2015;  c. 03/2015 Echo: EF 40-45%, difrf HK, PASP 38 mmHg, Mild MR, sev LAE/RAE.  [name] Myocardial infarction [facility])     a. 2012 Myoview notable for prior infarct;  b. 03/2015 Lexiscan CL: EF 37%, diff HK, small area of inferior infarct from apex to base-->Med Rx.  . ANXIETY     takes Xanax nightly  . ASTHMA     Albuterol prn and Advair daily;also takes Prednisone daily  . GERD (gastroesophageal reflux disease)        . Type II diabetes mellitus ([place])        . Essential hypertension        . COPD (chronic obstructive pulmonary disease) ([place])   . O2 dependent     uses oxygen 2 and 1/2 liters per [place] at hs  . SLEEP APNEA, OBSTRUCTIVE     a. doesn't use CPAP  . Peripheral neuropathy ([place])   . Arthritis   . Chronic constipation     takes OTC stool softener  . Diverticulitis   . Glaucoma   . DEPRESSION     takes Zoloft and Doxepin daily  . Anemia     supposed to be taking Vit B but doesn't  . Syncope     a. 03/2015 s/p MDT LINQ.  [name] Atrial fibrillation ([place]) 09/06/2015  Past Surgical History  Procedure Laterality Date  . Tonsillectomy    . Acne cyst removal      2 on back   . Cardioversion  04/18/2012    Procedure: CARDIOVERSION;  Surgeon: Fay Records, MD;  Location: [place];  Service: Cardiovascular;  Laterality: N/A;  . Tee without cardioversion  04/25/2012    Procedure: TRANSESOPHAGEAL ECHOCARDIOGRAM (TEE);  Surgeon: Thayer Headings, MD;  Location: [place];  Service: Cardiovascular;  Laterality: N/A;  . Cardioversion  04/25/2012    Procedure: CARDIOVERSION;  Surgeon: Thayer Headings, MD;  Location: [facility] ENDOSCOPY;  Service: Cardiovascular;  Laterality: N/A;  . Cardioversion  04/25/2012    Procedure: CARDIOVERSION;  Surgeon: Fay Records, MD;  Location: [place];  Service: Cardiovascular;  Laterality: N/A;  . Cardioversion  05/09/2012    Procedure: CARDIOVERSION;  Surgeon: Sherren Mocha, MD;   Location: [place];  Service: Cardiovascular;  Laterality: N/A;  changed from crenshaw to cooper by trish/leone-endo  . Colonoscopy    . Esophagogastroduodenoscopy    . Total knee arthroplasty Right 06/15/2014    Procedure: TOTAL KNEE ARTHROPLASTY;  Surgeon: Renette Butters, MD;  Location: [place];  Service: Orthopedics;  Laterality: Right;  . Colonoscopy with propofol N/A 10/21/2014    Procedure: COLONOSCOPY WITH PROPOFOL;  Surgeon: Ladene Artist, MD;  Location: [facility] ENDOSCOPY;  Service: Endoscopy;  Laterality: N/A;  . Tee without cardioversion N/A 03/15/2015    Procedure: TRANSESOPHAGEAL ECHOCARDIOGRAM (TEE);  Surgeon: Thayer Headings, MD;  Location: [place];  Service: Cardiovascular;  Laterality: N/A;  . Cardioversion N/A 03/15/2015    Procedure: CARDIOVERSION;  Surgeon: Thayer Headings, MD;  Location: [place];  Service: Cardiovascular;  Laterality: N/A;  . Ep implantable device N/A 04/06/2015    Procedure: Loop Recorder Insertion;  Surgeon: Evans Lance, MD;  Location: [place] CV LAB;  Service: Cardiovascular;  Laterality: N/A;    Current Outpatient Prescriptions  Medication Sig Dispense Refill  . ADVAIR DISKUS 250-50 MCG/DOSE AEPB INHALE 1 PUFF EVERY DAY AS DIRECTED 60 each 3  . albuterol (PROVENTIL HFA;VENTOLIN HFA) 108 (90 BASE) MCG/ACT inhaler Inhale 2 puffs into the lungs 4 times a day as needed    . ALPRAZolam (XANAX) 0.5 MG tablet Take 0.5 mg by mouth 2 (two) times daily as needed for anxiety.    . carisoprodol (SOMA) 350 MG tablet Take 1 tablet by mouth three times a day as needed in the morning for muscle spasms    . diltiazem (CARDIZEM CD) 120 MG 24 hr capsule Take 1 capsule (120 mg total) by mouth daily. 90 capsule 1  . diphenhydrAMINE (BENADRYL) 50 MG capsule TAKE ONE CAPSULE BY MOUTH EVERY 6 HOURS AS NEEDED FOR ITCHING 30 capsule 0  . doxepin (SINEQUAN) 25 MG capsule Take 1 capsule (25 mg total) by mouth 2 (two) times daily. 90 capsule 1  . fluocinonide cream (LIDEX)  [id number] % Apply 1 application topically 2 (two) times daily. 30 g 0  . hydrocortisone 2.5 % cream Apply topically 2 (two) times daily as needed. 453.6 g 1  . hydrOXYzine (ATARAX/VISTARIL) 25 MG tablet TAKE 1 TABLET BY MOUTH 3 TIMES A DAY FOR ITCHING 30 tablet 1  . ibuprofen (ADVIL,MOTRIN) 200 MG tablet Take 400 mg by mouth 2 (two) times daily.    [name] KLOR-CON M20 20 MEQ tablet TAKE 2 TABLETS BY MOUTH EVERY DAY 30 tablet 11  . levothyroxine (SYNTHROID) 25 MCG tablet Take 1 tablet (25 mcg total) by mouth  daily before breakfast. 30 tablet 3  . Linaclotide (LINZESS) 145 MCG CAPS capsule Take 145 mcg by mouth daily as needed (Crampy pain).     . Loratadine 10 MG CAPS Take 10 mg by mouth daily. Reported on 02/16/2016    . nebivolol (BYSTOLIC) 5 MG tablet Take 5 mg by mouth daily.    [name] omeprazole (PRILOSEC) 20 MG capsule Take 1 capsule (20 mg total) by mouth daily with supper. 90 capsule 1  . sertraline (ZOLOFT) 100 MG tablet TAKE 2 TABLETS BY MOUTH AT BEDTIME 180 tablet 3  . silver sulfADIAZINE (SILVADENE) 1 % cream Apply 1 application topically daily. 50 g 0  . tolnaftate (TINACTIN) 1 % powder Apply 1 application topically 2 (two) times daily. Apply to Groin area    . torsemide (DEMADEX) 20 MG tablet Take 2 tablets (40 mg total) by mouth 2 (two) times daily. 180 tablet 1  . traZODone (DESYREL) 100 MG tablet Take 1/2 tablet (50 mg) by mouth once daily    . triamcinolone cream (KENALOG) 0.1 % Apply 1 application topically 2 (two) times daily. 30 g 0  . warfarin (COUMADIN) 5 MG tablet Take as directed by anticoagulation clinic 30 tablet 3  . witch hazel-glycerin (TUCKS) pad Apply 1 application topically daily as needed for itching, irritation or hemorrhoids. Reported on 02/16/2016    . zolpidem (AMBIEN) 10 MG tablet TAKE 1 TABLET BY MOUTH AT BEDTIME 30 tablet 5  . [DISCONTINUED] atorvastatin (LIPITOR) 20 MG tablet Take 1 tablet (20 mg total) by mouth daily. 30 tablet 11  . [DISCONTINUED] gabapentin (NEURONTIN)  300 MG capsule Take 1 capsule (300 mg total) by mouth 3 (three) times daily. 90 capsule 5   No current facility-administered medications for this visit.    Allergies  Allergen Reactions  . Pravastatin Sodium Other (See Comments)    myalgia  . Simvastatin Other (See Comments)    severe leg cramps  . Statins Other (See Comments)    myalgia  . Amiodarone     hyperthyroidism  . Tape Other (See Comments)    Skin Tears Use Paper Tape Only    Review of Systems negative except from HPI and PMH  Physical Exam BP 126/72 mmHg  Pulse 75  Ht 5\' 10"  (1.778 m)  Wt 309 lb 9.6 oz (140.434 kg)  BMI 44.42 kg/m2 Well developed andmorbidly obese in no acute distress HENT normal E scleral and icterus clear JVP not discernible Neck Supple Clear to ausculation Irregularly  irregular rate and rhythm, no murmurs gallops or rub Soft with active bowel sounds No clubbing cyanosis  1+Edema Alert and oriented, grossly normal motor and sensory function Affect flat Skin Warm and Dry  ECG from 11/16 was reviewed. There is right bundle left posterior fascicular block and rates ranging on this tracing from 150--75   Assessment and  Plan  Ischemic heart disease with prior MI by Myoview  HFrEF   Cardiomyopathy-ischemic/nonischemic-    Atrial fibrillation persistent/permanent  Hyperthyroidism >>euthyroid     He has progressive symptoms of shortness of breath. He is volume overloaded. He has obstructive sleep apnea but has not been tolerant of CPAP therapy and likely has some degree of both primary and secondary pulmonary hypertension. His Myoview was abnormal before; we discussed proceeding with catheterization. Given his size I would like to do it with a right heart catheterization from his neck; hence, we will arrange for her to be done by the heart failure team  The issue of cardiomyopathy  in the context of his atrial fibrillation takes the issue of rate control. His length is provocative in  that it suggests heart rates in the 200s on a daily basis and average heart rates in the high 70s-80s. To give better assessment of this will undertake a 48 hour Holter monitor as if there would be no intervention based on the catheterization and heart rates are in fact fast, I would lean towards AV junction ablation and CRT pacing.  Clearly, his obesity hypoventilation syndrome contributes significantly to his exercise intolerance and is untreated sleep apnea aggravates it as well. I just don't know how to get past his otherwise seemingly unmodifiable issues

## 2016-03-22 ENCOUNTER — Encounter (HOSPITAL_COMMUNITY): Payer: Self-pay | Admitting: Cardiology

## 2016-03-22 ENCOUNTER — Other Ambulatory Visit: Payer: Self-pay | Admitting: General Practice

## 2016-03-22 MED ORDER — WARFARIN SODIUM 5 MG PO TABS: ORAL_TABLET | ORAL | Status: DC

## 2016-03-26 ENCOUNTER — Ambulatory Visit (INDEPENDENT_AMBULATORY_CARE_PROVIDER_SITE_OTHER): Payer: Commercial Managed Care - HMO

## 2016-03-26 DIAGNOSIS — I428 Other cardiomyopathies: Secondary | ICD-10-CM

## 2016-03-26 DIAGNOSIS — I429 Cardiomyopathy, unspecified: Secondary | ICD-10-CM

## 2016-03-26 DIAGNOSIS — I481 Persistent atrial fibrillation: Secondary | ICD-10-CM

## 2016-03-26 DIAGNOSIS — I4819 Other persistent atrial fibrillation: Secondary | ICD-10-CM

## 2016-03-26 DIAGNOSIS — I255 Ischemic cardiomyopathy: Secondary | ICD-10-CM

## 2016-04-02 ENCOUNTER — Ambulatory Visit (INDEPENDENT_AMBULATORY_CARE_PROVIDER_SITE_OTHER): Payer: Commercial Managed Care - HMO | Admitting: *Deleted

## 2016-04-02 DIAGNOSIS — R55 Syncope and collapse: Secondary | ICD-10-CM

## 2016-04-02 NOTE — Progress Notes (Signed)
Carelink Summary Report / Loop Recorder 

## 2016-04-09 LAB — CUP PACEART REMOTE DEVICE CHECK
Date Time Interrogation Session: 20170617133735
MDC IDC SESS DTM: 20170518133549

## 2016-04-10 ENCOUNTER — Encounter: Payer: Self-pay | Admitting: Internal Medicine

## 2016-04-10 ENCOUNTER — Ambulatory Visit (INDEPENDENT_AMBULATORY_CARE_PROVIDER_SITE_OTHER): Payer: Commercial Managed Care - HMO | Admitting: Internal Medicine

## 2016-04-10 VITALS — BP 118/60 | HR 64 | Ht 70.0 in | Wt 310.8 lb

## 2016-04-10 DIAGNOSIS — I4819 Other persistent atrial fibrillation: Secondary | ICD-10-CM

## 2016-04-10 DIAGNOSIS — I255 Ischemic cardiomyopathy: Secondary | ICD-10-CM

## 2016-04-10 DIAGNOSIS — I481 Persistent atrial fibrillation: Secondary | ICD-10-CM | POA: Diagnosis not present

## 2016-04-10 DIAGNOSIS — I429 Cardiomyopathy, unspecified: Secondary | ICD-10-CM | POA: Diagnosis not present

## 2016-04-10 DIAGNOSIS — I5022 Chronic systolic (congestive) heart failure: Secondary | ICD-10-CM | POA: Diagnosis not present

## 2016-04-10 DIAGNOSIS — I428 Other cardiomyopathies: Secondary | ICD-10-CM

## 2016-04-10 MED ORDER — DILTIAZEM HCL ER COATED BEADS 120 MG PO CP24: 120.0000 mg | ORAL_CAPSULE | Freq: Two times a day (BID) | ORAL | Status: DC

## 2016-04-10 NOTE — Progress Notes (Signed)
Patient Care Team: [name], MD as PCP - [place], RD as Dietitian   HPI  [name] is a 60 y.o. male   seen in followup for atrial fibrillation for which he is treated with amiodarone.  There was some concern regarding dyspnea so he underwent pulmonary function testing by Dr. Gwenette Greet;  he also has a history of syncope and is status post ILR insertion 6/16  He continues to struggle with shortness of breath. He also significant fatigue. He has sleep apnea but does not use CPAP. He does use oxygen.  Recent catheterization was notable for normal filling pressures   Echocardiogram 7/13 demonstrated EF 20-25% with severe biatrial enlargement. Repeat echo 8/14 and demonstrated an intercurrent normalization of LV function with biatrial enlargement and mild RV dilatation    Myoview 2012 demonstrated an ejection fraction of 44% with a prior inferior wall infarct Myoview 2015 demonstrated an ejection fraction 57% with an inferior wall perfusion defect question bowel  DATE TEST     /2012 myoview   EF44 Prior IMI perfsion defect  /2015 myoview   EF57% Perfusion defect--MI vs bowel  2016 myoview 35-40 NO perfusion defect  6/16 Echo 40-45 Severe BAE  6/17 Cath 30 Global Hypokinesis With normal CA And normal pulm    Antiarrhythmics Date  dofetilide 2013  amidoarone 2016     Holter 6/17 HR with average rates at night 7-9pm >100   He had right bundle branch block/LAD             Past Medical History  Diagnosis Date  . DYSKINESIA, ESOPHAGUS   . FIBROMYALGIA   . HYPERLIPIDEMIA     a. Intolerant to statins.  . INSOMNIA     takes Ambien nightly  . Rash and other nonspecific skin eruption 04/12/2009    no cause found saw dermatologists x 2 and allergist  . Paroxysmal atrial fibrillation ([place])     a. CHA2DS2VASc = 3--> takes Coumadin;  b. 03/15/2015 Successful TEE/DCCV;  c. 03/2015 recurrent afib, Amio d/c'd in setting of hyperthyroidism.  .  Cardiomyopathy ([place])     a. EF 25% TEE July 2013; b. EF normalized 2015;  c. 03/2015 Echo: EF 40-45%, difrf HK, PASP 38 mmHg, Mild MR, sev LAE/RAE.  [name] Myocardial infarction [facility])     a. 2012 Myoview notable for prior infarct;  b. 03/2015 Lexiscan CL: EF 37%, diff HK, small area of inferior infarct from apex to base-->Med Rx.  . ANXIETY     takes Xanax nightly  . ASTHMA     Albuterol prn and Advair daily;also takes Prednisone daily  . GERD (gastroesophageal reflux disease)        . Type II diabetes mellitus ([place])        . Essential hypertension        . COPD (chronic obstructive pulmonary disease) ([place])   . O2 dependent     uses oxygen 2 and 1/2 liters per [place] at hs  . SLEEP APNEA, OBSTRUCTIVE     a. doesn't use CPAP  . Peripheral neuropathy ([place])   . Arthritis   . Chronic constipation     takes OTC stool softener  . Diverticulitis   . Glaucoma   . DEPRESSION     takes Zoloft and Doxepin daily  . Anemia     supposed to be taking Vit B but doesn't  . Syncope     a. 03/2015 s/p MDT LINQ.  [name]  Atrial fibrillation ([place]) 09/06/2015    Past Surgical History  Procedure Laterality Date  . Tonsillectomy    . Acne cyst removal      2 on back   . Cardioversion  04/18/2012    Procedure: CARDIOVERSION;  Surgeon: Fay Records, MD;  Location: [place];  Service: Cardiovascular;  Laterality: N/A;  . Tee without cardioversion  04/25/2012    Procedure: TRANSESOPHAGEAL ECHOCARDIOGRAM (TEE);  Surgeon: Thayer Headings, MD;  Location: [place];  Service: Cardiovascular;  Laterality: N/A;  . Cardioversion  04/25/2012    Procedure: CARDIOVERSION;  Surgeon: Thayer Headings, MD;  Location: [facility] ENDOSCOPY;  Service: Cardiovascular;  Laterality: N/A;  . Cardioversion  04/25/2012    Procedure: CARDIOVERSION;  Surgeon: Fay Records, MD;  Location: [place];  Service: Cardiovascular;  Laterality: N/A;  . Cardioversion  05/09/2012    Procedure: CARDIOVERSION;  Surgeon: Sherren Mocha, MD;  Location: [place];  Service:  Cardiovascular;  Laterality: N/A;  changed from crenshaw to cooper by trish/leone-endo  . Colonoscopy    . Esophagogastroduodenoscopy    . Total knee arthroplasty Right 06/15/2014    Procedure: TOTAL KNEE ARTHROPLASTY;  Surgeon: Renette Butters, MD;  Location: [place];  Service: Orthopedics;  Laterality: Right;  . Colonoscopy with propofol N/A 10/21/2014    Procedure: COLONOSCOPY WITH PROPOFOL;  Surgeon: Ladene Artist, MD;  Location: [facility] ENDOSCOPY;  Service: Endoscopy;  Laterality: N/A;  . Tee without cardioversion N/A 03/15/2015    Procedure: TRANSESOPHAGEAL ECHOCARDIOGRAM (TEE);  Surgeon: Thayer Headings, MD;  Location: [place];  Service: Cardiovascular;  Laterality: N/A;  . Cardioversion N/A 03/15/2015    Procedure: CARDIOVERSION;  Surgeon: Thayer Headings, MD;  Location: [place];  Service: Cardiovascular;  Laterality: N/A;  . Ep implantable device N/A 04/06/2015    Procedure: Loop Recorder Insertion;  Surgeon: Evans Lance, MD;  Location: [place] CV LAB;  Service: Cardiovascular;  Laterality: N/A;  . Cardiac catheterization N/A 03/21/2016    Procedure: Right/Left Heart Cath and Coronary Angiography;  Surgeon: Larey Dresser, MD;  Location: [place] CV LAB;  Service: Cardiovascular;  Laterality: N/A;    Current Outpatient Prescriptions  Medication Sig Dispense Refill  . ADVAIR DISKUS 250-50 MCG/DOSE AEPB INHALE 1 PUFF EVERY DAY AS DIRECTED 60 each 3  . albuterol (PROVENTIL HFA;VENTOLIN HFA) 108 (90 BASE) MCG/ACT inhaler Inhale 2 puffs into the lungs 4 times a day as needed    . ALPRAZolam (XANAX) 0.5 MG tablet Take 0.5 mg by mouth at bedtime as needed for sleep.     . carisoprodol (SOMA) 350 MG tablet Take 1 tablet by mouth three times a day as needed in the morning for muscle spasms    . diltiazem (CARDIZEM CD) 120 MG 24 hr capsule Take 1 capsule (120 mg total) by mouth daily. 30 capsule 11  . diphenhydrAMINE (BENADRYL) 50 MG capsule TAKE ONE CAPSULE BY MOUTH EVERY 6 HOURS AS  NEEDED FOR ITCHING 30 capsule 0  . doxepin (SINEQUAN) 25 MG capsule Take 1 capsule (25 mg total) by mouth 2 (two) times daily. 90 capsule 1  . fluocinonide cream (LIDEX) [id number] % Apply 1 application topically 2 (two) times daily. (Patient not taking: Reported on 03/19/2016) 30 g 0  . hydrocortisone 2.5 % cream Apply topically 2 (two) times daily as needed. (Patient taking differently: Apply 1 application topically 2 (two) times daily as needed (itching). ) 453.6 g 1  . hydrOXYzine (ATARAX/VISTARIL) 25 MG tablet TAKE 1 TABLET  BY MOUTH 3 TIMES A DAY FOR ITCHING 30 tablet 1  . ibuprofen (ADVIL,MOTRIN) 200 MG tablet Take 400 mg by mouth 2 (two) times daily.    [name] KLOR-CON M20 20 MEQ tablet TAKE 2 TABLETS BY MOUTH EVERY DAY (Patient taking differently: TAKE 1 TABLETS BY MOUTH  TWICE A DAY) 30 tablet 11  . levothyroxine (SYNTHROID) 25 MCG tablet Take 1 tablet (25 mcg total) by mouth daily before breakfast. 30 tablet 3  . Linaclotide (LINZESS) 145 MCG CAPS capsule Take 145 mcg by mouth daily as needed (Crampy pain).     . Loratadine 10 MG CAPS Take 10 mg by mouth daily. Reported on 02/16/2016    . nebivolol (BYSTOLIC) 5 MG tablet Take 5 mg by mouth daily.    [name] omeprazole (PRILOSEC) 20 MG capsule Take 1 capsule (20 mg total) by mouth daily with supper. 90 capsule 1  . sertraline (ZOLOFT) 100 MG tablet TAKE 2 TABLETS BY MOUTH AT BEDTIME 180 tablet 3  . silver sulfADIAZINE (SILVADENE) 1 % cream Apply 1 application topically daily. (Patient not taking: Reported on 03/19/2016) 50 g 0  . torsemide (DEMADEX) 20 MG tablet Take 3 tablets (60 mg total) by mouth 2 (two) times daily. 180 tablet 3  . traZODone (DESYREL) 100 MG tablet Take 50 mg by mouth at bedtime.     . triamcinolone cream (KENALOG) 0.1 % Apply 1 application topically 2 (two) times daily. (Patient not taking: Reported on 03/19/2016) 30 g 0  . warfarin (COUMADIN) 5 MG tablet Take as directed by anticoagulation clinic 30 tablet 3  . zolpidem (AMBIEN) 10 MG tablet  TAKE 1 TABLET BY MOUTH AT BEDTIME 30 tablet 5  . [DISCONTINUED] atorvastatin (LIPITOR) 20 MG tablet Take 1 tablet (20 mg total) by mouth daily. 30 tablet 11  . [DISCONTINUED] gabapentin (NEURONTIN) 300 MG capsule Take 1 capsule (300 mg total) by mouth 3 (three) times daily. 90 capsule 5   No current facility-administered medications for this visit.    Allergies  Allergen Reactions  . Pravastatin Sodium Other (See Comments)    myalgia  . Simvastatin Other (See Comments)    severe leg cramps  . Statins Other (See Comments)    myalgia  . Amiodarone     hyperthyroidism  . Tape Other (See Comments)    Skin Tears Use Paper Tape Only    Review of Systems negative except from HPI and PMH  Physical Exam There were no vitals taken for this visit. Well developed andmorbidly obese in no acute distress HENT normal E scleral and icterus clear JVP not discernible Neck Supple Clear to ausculation Irregularly  irregular rate and rhythm, no murmurs gallops or rub Soft with active bowel sounds No clubbing cyanosis  1+Edema Alert and oriented, grossly normal motor and sensory function Affect flat Skin Warm and Dry  ECG  June 1 atrial fibrillation at 75 intervals-/18/43 Axis left -85   Assessment and  Plan  Ischemic heart disease with prior MI by Myoview  HFrEF   Cardiomyopathy-ischemic/nonischemic-    Atrial fibrillation persistent/permanent  Hyperthyroidism >>euthyroid   RBBB    Cardiac-wise, we have few options to improve his functional status. He does have rapid rates in the evening. We will increase his diltiazem to twice a day.  Given the fact that his conduction issue is right bundle particularly in light of his left axis deviation and also with his pulmonary and weight comorbidities I am not sanguine that resynchronization would be of benefit. I have  let him know that I am reluctant to pursue this  I do think be helpful for him to see pulmonary. He is on nocturnal  oxygen. Might benefit from CPAP.

## 2016-04-10 NOTE — Patient Instructions (Signed)
Medication Instructions: - Your physician has recommended you make the following change in your medication:  1) Increase Diltiazem to 120 mg one capsule by mouth twice daily  Labwork: - none  Procedures/Testing: - none  Follow-Up: - Your physician recommends that you schedule a follow-up appointment in: 3 months with Tommye Standard, PA for Dr. [name].  Any Additional Special Instructions Will Be Listed Below (If Applicable).     If you need a refill on your cardiac medications before your next appointment, please call your pharmacy.

## 2016-04-12 DIAGNOSIS — G4733 Obstructive sleep apnea (adult) (pediatric): Secondary | ICD-10-CM | POA: Diagnosis not present

## 2016-04-13 ENCOUNTER — Ambulatory Visit: Payer: Commercial Managed Care - HMO

## 2016-04-16 ENCOUNTER — Other Ambulatory Visit: Payer: Self-pay | Admitting: Internal Medicine

## 2016-04-20 ENCOUNTER — Ambulatory Visit (INDEPENDENT_AMBULATORY_CARE_PROVIDER_SITE_OTHER): Payer: Commercial Managed Care - HMO | Admitting: General Practice

## 2016-04-20 DIAGNOSIS — Z5181 Encounter for therapeutic drug level monitoring: Secondary | ICD-10-CM | POA: Diagnosis not present

## 2016-04-20 LAB — POCT INR: INR: 2.5

## 2016-04-20 NOTE — Progress Notes (Signed)
Pre visit review using our clinic review tool, if applicable. No additional management support is needed unless otherwise documented below in the visit note. 

## 2016-04-24 ENCOUNTER — Other Ambulatory Visit: Payer: Self-pay | Admitting: Internal Medicine

## 2016-04-24 ENCOUNTER — Telehealth: Payer: Self-pay | Admitting: *Deleted

## 2016-04-24 DIAGNOSIS — I4819 Other persistent atrial fibrillation: Secondary | ICD-10-CM

## 2016-04-24 DIAGNOSIS — I255 Ischemic cardiomyopathy: Secondary | ICD-10-CM

## 2016-04-24 DIAGNOSIS — I428 Other cardiomyopathies: Secondary | ICD-10-CM

## 2016-04-24 MED ORDER — ALPRAZOLAM 0.5 MG PO TABS: 0.5000 mg | ORAL_TABLET | Freq: Every evening | ORAL | Status: DC | PRN

## 2016-04-24 NOTE — Telephone Encounter (Signed)
Receive fax pt requesting refill on Alprazolam 0.5mg  take 1 twice a day as needed. Last filled 03/24/16...Johny Chess

## 2016-04-24 NOTE — Telephone Encounter (Signed)
Refill faxed to pharmacy.

## 2016-04-24 NOTE — Telephone Encounter (Signed)
Done hardcopy to Corinne  

## 2016-04-30 ENCOUNTER — Ambulatory Visit (INDEPENDENT_AMBULATORY_CARE_PROVIDER_SITE_OTHER): Payer: Commercial Managed Care - HMO | Admitting: *Deleted

## 2016-04-30 DIAGNOSIS — R55 Syncope and collapse: Secondary | ICD-10-CM | POA: Diagnosis not present

## 2016-04-30 NOTE — Progress Notes (Signed)
Carelink Summary Report / Loop Recorder 

## 2016-05-07 ENCOUNTER — Telehealth: Payer: Self-pay | Admitting: Internal Medicine

## 2016-05-07 DIAGNOSIS — I428 Other cardiomyopathies: Secondary | ICD-10-CM

## 2016-05-07 DIAGNOSIS — I4819 Other persistent atrial fibrillation: Secondary | ICD-10-CM

## 2016-05-07 DIAGNOSIS — I255 Ischemic cardiomyopathy: Secondary | ICD-10-CM

## 2016-05-07 NOTE — Telephone Encounter (Signed)
patrient is requestign a refill of zolpidem (AMBIEN) 10 MG tablet [id number]  And carisoprodol (SOMA) 350 MG tablet [id number]  [facility] on [address]

## 2016-05-08 ENCOUNTER — Other Ambulatory Visit: Payer: Self-pay | Admitting: Internal Medicine

## 2016-05-08 DIAGNOSIS — I4819 Other persistent atrial fibrillation: Secondary | ICD-10-CM

## 2016-05-08 DIAGNOSIS — I428 Other cardiomyopathies: Secondary | ICD-10-CM

## 2016-05-08 DIAGNOSIS — I255 Ischemic cardiomyopathy: Secondary | ICD-10-CM

## 2016-05-08 MED ORDER — CARISOPRODOL 350 MG PO TABS: 350.0000 mg | ORAL_TABLET | Freq: Three times a day (TID) | ORAL | 5 refills | Status: DC | PRN

## 2016-05-08 NOTE — Telephone Encounter (Signed)
Done hardcopy to Corinne  

## 2016-05-08 NOTE — Telephone Encounter (Signed)
Done erx 

## 2016-05-09 NOTE — Telephone Encounter (Signed)
Medication refill sent to pharmacy  

## 2016-05-12 DIAGNOSIS — G4733 Obstructive sleep apnea (adult) (pediatric): Secondary | ICD-10-CM | POA: Diagnosis not present

## 2016-05-16 ENCOUNTER — Other Ambulatory Visit: Payer: Self-pay | Admitting: Internal Medicine

## 2016-05-24 LAB — CUP PACEART REMOTE DEVICE CHECK: MDC IDC SESS DTM: 20170717140812

## 2016-05-25 ENCOUNTER — Other Ambulatory Visit: Payer: Self-pay | Admitting: Internal Medicine

## 2016-05-25 ENCOUNTER — Other Ambulatory Visit: Payer: Self-pay

## 2016-05-25 MED ORDER — DOXEPIN HCL 25 MG PO CAPS: 25.0000 mg | ORAL_CAPSULE | Freq: Two times a day (BID) | ORAL | 1 refills | Status: DC

## 2016-05-25 NOTE — Progress Notes (Signed)
Refill sent to pharmacy.   

## 2016-05-28 ENCOUNTER — Other Ambulatory Visit: Payer: Self-pay | Admitting: Endocrinology

## 2016-05-30 ENCOUNTER — Encounter: Payer: Self-pay | Admitting: Internal Medicine

## 2016-05-30 ENCOUNTER — Ambulatory Visit (INDEPENDENT_AMBULATORY_CARE_PROVIDER_SITE_OTHER): Payer: Commercial Managed Care - HMO | Admitting: *Deleted

## 2016-05-30 ENCOUNTER — Other Ambulatory Visit (INDEPENDENT_AMBULATORY_CARE_PROVIDER_SITE_OTHER): Payer: Commercial Managed Care - HMO

## 2016-05-30 ENCOUNTER — Ambulatory Visit (INDEPENDENT_AMBULATORY_CARE_PROVIDER_SITE_OTHER): Payer: Commercial Managed Care - HMO | Admitting: Internal Medicine

## 2016-05-30 VITALS — BP 140/80 | HR 88 | Temp 98.4°F | Resp 20 | Wt 326.0 lb

## 2016-05-30 DIAGNOSIS — E119 Type 2 diabetes mellitus without complications: Secondary | ICD-10-CM | POA: Diagnosis not present

## 2016-05-30 DIAGNOSIS — I10 Essential (primary) hypertension: Secondary | ICD-10-CM

## 2016-05-30 DIAGNOSIS — R6889 Other general symptoms and signs: Secondary | ICD-10-CM

## 2016-05-30 DIAGNOSIS — R7989 Other specified abnormal findings of blood chemistry: Secondary | ICD-10-CM | POA: Diagnosis not present

## 2016-05-30 DIAGNOSIS — F329 Major depressive disorder, single episode, unspecified: Secondary | ICD-10-CM | POA: Diagnosis not present

## 2016-05-30 DIAGNOSIS — Z0001 Encounter for general adult medical examination with abnormal findings: Secondary | ICD-10-CM

## 2016-05-30 DIAGNOSIS — F32A Depression, unspecified: Secondary | ICD-10-CM

## 2016-05-30 DIAGNOSIS — Z7901 Long term (current) use of anticoagulants: Secondary | ICD-10-CM

## 2016-05-30 DIAGNOSIS — E038 Other specified hypothyroidism: Secondary | ICD-10-CM | POA: Diagnosis not present

## 2016-05-30 DIAGNOSIS — E785 Hyperlipidemia, unspecified: Secondary | ICD-10-CM

## 2016-05-30 DIAGNOSIS — R55 Syncope and collapse: Secondary | ICD-10-CM | POA: Diagnosis not present

## 2016-05-30 LAB — BASIC METABOLIC PANEL
BUN: 15 mg/dL (ref 6–23)
CALCIUM: 8.7 mg/dL (ref 8.4–10.5)
CO2: 27 mEq/L (ref 19–32)
Chloride: 105 mEq/L (ref 96–112)
Creatinine, Ser: 0.88 mg/dL (ref 0.40–1.50)
GFR: 93.43 mL/min (ref >60.00)
GLUCOSE: 84 mg/dL (ref 70–99)
Potassium: 3.7 mEq/L (ref 3.5–5.1)
Sodium: 140 mEq/L (ref 135–145)

## 2016-05-30 LAB — MICROALBUMIN / CREATININE URINE RATIO
Creatinine,U: 109 mg/dL
MICROALB/CREAT RATIO: 1.1 mg/g (ref 0.0–30.0)
Microalb, Ur: 1.2 mg/dL (ref 0.0–1.9)

## 2016-05-30 LAB — TSH
TSH: 1.44 u[IU]/mL (ref 0.35–4.50)
TSH: 1.38 u[IU]/mL (ref 0.35–4.50)

## 2016-05-30 LAB — HEMOGLOBIN A1C: HEMOGLOBIN A1C: 5.9 % (ref 4.6–6.5)

## 2016-05-30 LAB — HEPATIC FUNCTION PANEL
ALBUMIN: 4.1 g/dL (ref 3.5–5.2)
ALT: 18 U/L (ref 0–53)
AST: 22 U/L (ref 0–37)
Alkaline Phosphatase: 70 U/L (ref 39–117)
Bilirubin, Direct: 0.1 mg/dL (ref 0.0–0.3)
Total Bilirubin: 0.4 mg/dL (ref 0.2–1.2)
Total Protein: 6.3 g/dL (ref 6.0–8.3)

## 2016-05-30 LAB — CBC WITH DIFFERENTIAL/PLATELET
BASOS ABS: 0 10*3/uL (ref 0.0–0.1)
Basophils Relative: 0.5 % (ref 0.0–3.0)
Eosinophils Absolute: 0.2 10*3/uL (ref 0.0–0.7)
Eosinophils Relative: 2 % (ref 0.0–5.0)
HEMATOCRIT: 37.7 % — AB (ref 39.0–52.0)
Hemoglobin: 12.8 g/dL — ABNORMAL LOW (ref 13.0–17.0)
LYMPHS ABS: 1.6 10*3/uL (ref 0.7–4.0)
LYMPHS PCT: 21.7 % (ref 12.0–46.0)
MCHC: 34 g/dL (ref 30.0–36.0)
MCV: 87.9 fl (ref 78.0–100.0)
MONOS PCT: 9.5 % (ref 3.0–12.0)
Monocytes Absolute: 0.7 10*3/uL (ref 0.1–1.0)
NEUTROS PCT: 66.3 % (ref 43.0–77.0)
Neutro Abs: 5 10*3/uL (ref 1.4–7.7)
Platelets: 192 10*3/uL (ref 150.0–400.0)
RBC: 4.29 Mil/uL (ref 4.22–5.81)
RDW: 16.5 % — ABNORMAL HIGH (ref 11.5–15.5)
WBC: 7.5 10*3/uL (ref 4.0–10.5)

## 2016-05-30 LAB — LIPID PANEL
CHOLESTEROL: 181 mg/dL (ref 0–200)
HDL: 36.9 mg/dL — AB (ref >39.00)
NonHDL: 144.38
TRIGLYCERIDES: 215 mg/dL — AB (ref 0.0–149.0)
Total CHOL/HDL Ratio: 5
VLDL: 43 mg/dL — AB (ref 0.0–40.0)

## 2016-05-30 LAB — LDL CHOLESTEROL, DIRECT: Direct LDL: 111 mg/dL

## 2016-05-30 LAB — PROTIME-INR
INR: 3.2 ratio — ABNORMAL HIGH (ref 0.8–1.0)
Prothrombin Time: 34.4 s — ABNORMAL HIGH (ref 9.6–13.1)

## 2016-05-30 LAB — T4, FREE: FREE T4: 0.67 ng/dL (ref 0.60–1.60)

## 2016-05-30 LAB — PSA: PSA: 2.59 ng/mL (ref 0.10–4.00)

## 2016-05-30 NOTE — Patient Instructions (Signed)
Please continue all other medications as before, and refills have been done if requested.  Please have the pharmacy call with any other refills you may need.  Please continue your efforts at being more active, low cholesterol diet, and weight control.  You are otherwise up to date with prevention measures today.  Please keep your appointments with your specialists as you may have planned  Please go to the LAB in the Basement (turn left off the elevator) for the tests to be done today  You will be contacted by phone if any changes need to be made immediately.  Otherwise, you will receive a letter about your results with an explanation, but please check with MyChart first.  Please remember to sign up for MyChart if you have not done so, as this will be important to you in the future with finding out test results, communicating by private email, and scheduling acute appointments online when needed.  Please return in 6 months, or sooner if needed, with Lab testing done 3-5 days before   

## 2016-05-30 NOTE — Assessment & Plan Note (Signed)
stable overall by history and exam, recent data reviewed with pt, and pt to continue medical treatment as before,  to f/u any worsening symptoms or concerns Lab Results  Component Value Date   LDLCALC 97 11/29/2015

## 2016-05-30 NOTE — Progress Notes (Signed)
Pre visit review using our clinic review tool, if applicable. No additional management support is needed unless otherwise documented below in the visit note. 

## 2016-05-30 NOTE — Assessment & Plan Note (Signed)
Mild chronic, declines any change in med

## 2016-05-30 NOTE — Progress Notes (Signed)
Subjective:    Patient ID: [name], male    DOB: 1956-03-27, 60 y.o.   MRN: [id number]  HPI  Here to f/u; overall doing ok,  Pt denies chest pain, increasing sob or doe, wheezing, orthopnea, PND, increased LE swelling, palpitations, dizziness or syncope.  Pt denies new neurological symptoms such as new headache, or facial or extremity weakness or numbness.  Pt denies polydipsia, polyuria, or low sugar episode.   Pt denies new neurological symptoms such as new headache, or facial or extremity weakness or numbness.   Pt states overall good compliance with meds, mostly trying to follow appropriate diet, with wt overall stable,  but little exercise however. Has ongoing left knee and hip pain, followed per ortho  Wants to be trying to get bariatric surgury for October at [facility].   Has seen endo/Dr Dwyane Dee, now with low thyroid, possibly related to fatigue and lower mood. Denies worsening depressive symptoms, suicidal ideation, or panic; has ongoing anxiety.  Past Medical History:  Diagnosis Date  . Anemia    supposed to be taking Vit B but doesn't  . ANXIETY    takes Xanax nightly  . Arthritis   . ASTHMA    Albuterol prn and Advair daily;also takes Prednisone daily  . Atrial fibrillation ([place]) 09/06/2015  . Cardiomyopathy [facility])    a. EF 25% TEE July 2013; b. EF normalized 2015;  c. 03/2015 Echo: EF 40-45%, difrf HK, PASP 38 mmHg, Mild MR, sev LAE/RAE.  [name] Chronic constipation    takes OTC stool softener  . COPD (chronic obstructive pulmonary disease) ([place])   . DEPRESSION    takes Zoloft and Doxepin daily  . Diverticulitis   . DYSKINESIA, ESOPHAGUS   . Essential hypertension       . FIBROMYALGIA   . GERD (gastroesophageal reflux disease)       . Glaucoma   . HYPERLIPIDEMIA    a. Intolerant to statins.  . INSOMNIA    takes Ambien nightly  . Myocardial infarction [facility])    a. 2012 Myoview notable for prior infarct;  b. 03/2015 Lexiscan CL: EF 37%, diff HK, small area of inferior infarct  from apex to base-->Med Rx.  . O2 dependent    uses oxygen 2 and 1/2 liters per [place] at hs  . Paroxysmal atrial fibrillation ([facility])    a. CHA2DS2VASc = 3--> takes Coumadin;  b. 03/15/2015 Successful TEE/DCCV;  c. 03/2015 recurrent afib, Amio d/c'd in setting of hyperthyroidism.  . Peripheral neuropathy ([place])   . Rash and other nonspecific skin eruption 04/12/2009   no cause found saw dermatologists x 2 and allergist  . SLEEP APNEA, OBSTRUCTIVE    a. doesn't use CPAP  . Syncope    a. 03/2015 s/p MDT LINQ.  [name] Type II diabetes mellitus ([place])        Past Surgical History:  Procedure Laterality Date  . ACNE CYST REMOVAL     2 on back   . CARDIAC CATHETERIZATION N/A 03/21/2016   Procedure: Right/Left Heart Cath and Coronary Angiography;  Surgeon: Larey Dresser, MD;  Location: [place] CV LAB;  Service: Cardiovascular;  Laterality: N/A;  . CARDIOVERSION  04/18/2012   Procedure: CARDIOVERSION;  Surgeon: Fay Records, MD;  Location: [place];  Service: Cardiovascular;  Laterality: N/A;  . CARDIOVERSION  04/25/2012   Procedure: CARDIOVERSION;  Surgeon: Thayer Headings, MD;  Location: [place];  Service: Cardiovascular;  Laterality: N/A;  . CARDIOVERSION  04/25/2012   Procedure: CARDIOVERSION;  Surgeon: Fay Records, MD;  Location: [place];  Service: Cardiovascular;  Laterality: N/A;  . CARDIOVERSION  05/09/2012   Procedure: CARDIOVERSION;  Surgeon: Sherren Mocha, MD;  Location: [place];  Service: Cardiovascular;  Laterality: N/A;  changed from crenshaw to cooper by trish/leone-endo  . CARDIOVERSION N/A 03/15/2015   Procedure: CARDIOVERSION;  Surgeon: Thayer Headings, MD;  Location: [facility] ENDOSCOPY;  Service: Cardiovascular;  Laterality: N/A;  . COLONOSCOPY    . COLONOSCOPY WITH PROPOFOL N/A 10/21/2014   Procedure: COLONOSCOPY WITH PROPOFOL;  Surgeon: Ladene Artist, MD;  Location: [facility] ENDOSCOPY;  Service: Endoscopy;  Laterality: N/A;  . EP IMPLANTABLE DEVICE N/A 04/06/2015   Procedure: Loop Recorder  Insertion;  Surgeon: Evans Lance, MD;  Location: [place] CV LAB;  Service: Cardiovascular;  Laterality: N/A;  . ESOPHAGOGASTRODUODENOSCOPY    . TEE WITHOUT CARDIOVERSION  04/25/2012   Procedure: TRANSESOPHAGEAL ECHOCARDIOGRAM (TEE);  Surgeon: Thayer Headings, MD;  Location: [place];  Service: Cardiovascular;  Laterality: N/A;  . TEE WITHOUT CARDIOVERSION N/A 03/15/2015   Procedure: TRANSESOPHAGEAL ECHOCARDIOGRAM (TEE);  Surgeon: Thayer Headings, MD;  Location: [place];  Service: Cardiovascular;  Laterality: N/A;  . TONSILLECTOMY    . TOTAL KNEE ARTHROPLASTY Right 06/15/2014   Procedure: TOTAL KNEE ARTHROPLASTY;  Surgeon: Renette Butters, MD;  Location: [place];  Service: Orthopedics;  Laterality: Right;    reports that he quit smoking about 8 years ago. His smoking use included Cigarettes. He has a 60.00 pack-year smoking history. He has never used smokeless tobacco. He reports that he does not drink alcohol or use drugs. family history includes Allergies in his mother; Asthma in his maternal grandmother and mother; COPD in his mother; Colon polyps in his mother; Hypothyroidism in his mother. Allergies  Allergen Reactions  . Pravastatin Sodium Other (See Comments)    myalgia  . Simvastatin Other (See Comments)    severe leg cramps  . Statins Other (See Comments)    myalgia  . Amiodarone     hyperthyroidism  . Tape Other (See Comments)    Skin Tears Use Paper Tape Only   Current Outpatient Prescriptions on File Prior to Visit  Medication Sig Dispense Refill  . ADVAIR DISKUS 250-50 MCG/DOSE AEPB INHALE 1 PUFF EVERY DAY AS DIRECTED 60 each 3  . albuterol (PROVENTIL HFA;VENTOLIN HFA) 108 (90 BASE) MCG/ACT inhaler Inhale 2 puffs into the lungs 4 times a day as needed    . ALPRAZolam (XANAX) 0.5 MG tablet Take 1 tablet (0.5 mg total) by mouth at bedtime as needed for sleep. 90 tablet 1  . BYSTOLIC 5 MG tablet TAKE 1 AND 1/2 TABLETS BY MOUTH EVERY DAY 45 tablet 2  . carisoprodol  (SOMA) 350 MG tablet Take 1 tablet (350 mg total) by mouth 3 (three) times daily as needed for muscle spasms. Take 1 tablet by mouth three times a day as needed in the morning for muscle spasms 90 tablet 5  . diltiazem (CARDIZEM CD) 120 MG 24 hr capsule Take 1 capsule (120 mg total) by mouth 2 (two) times daily. 30 capsule 11  . diphenhydrAMINE (BENADRYL) 50 MG capsule TAKE ONE CAPSULE BY MOUTH EVERY 6 HOURS AS NEEDED FOR ITCHING 30 capsule 0  . doxepin (SINEQUAN) 25 MG capsule TAKE ONE CAPSULE BY MOUTH TWICE A DAY 180 capsule 1  . doxepin (SINEQUAN) 25 MG capsule Take 1 capsule (25 mg total) by mouth 2 (two) times daily. 90 capsule 1  . fluocinonide cream (LIDEX) 0.05 %  Apply 1 application topically 2 (two) times daily. 30 g 0  . hydrocortisone 2.5 % cream Apply 1 application topically 2 (two) times daily. itching    . hydrOXYzine (ATARAX/VISTARIL) 25 MG tablet TAKE 1 TABLET BY MOUTH 3 TIMES A DAY FOR ITCHING 30 tablet 1  . ibuprofen (ADVIL,MOTRIN) 200 MG tablet Take 400 mg by mouth 2 (two) times daily.    [name] levothyroxine (SYNTHROID, LEVOTHROID) 25 MCG tablet TAKE 1 TABLET BY MOUTH EVERY DAY BEFORE BREAKFAST 30 tablet 3  . Linaclotide (LINZESS) 145 MCG CAPS capsule Take 145 mcg by mouth daily as needed (Crampy pain).     . Loratadine 10 MG CAPS Take 10 mg by mouth daily. Reported on 02/16/2016    . nebivolol (BYSTOLIC) 5 MG tablet Take 5 mg by mouth daily.    [name] omeprazole (PRILOSEC) 20 MG capsule Take 1 capsule (20 mg total) by mouth daily with supper. 90 capsule 1  . potassium chloride SA (K-DUR,KLOR-CON) 20 MEQ tablet Take 40 mEq by mouth daily.    . sertraline (ZOLOFT) 100 MG tablet TAKE 2 TABLETS BY MOUTH AT BEDTIME 180 tablet 3  . silver sulfADIAZINE (SILVADENE) 1 % cream APPLY 1 APPLICATION TOPICALLY DAILY. 50 g 0  . torsemide (DEMADEX) 20 MG tablet Take 3 tablets (60 mg total) by mouth 2 (two) times daily. 180 tablet 3  . traZODone (DESYREL) 100 MG tablet Take 50 mg by mouth at bedtime.       . traZODone (DESYREL) 100 MG tablet TAKE 1/2 TABLET BY MOUTH AT BEDTIME AS NEEDED 45 tablet 1  . triamcinolone cream (KENALOG) 0.1 % Apply 1 application topically 2 (two) times daily. 30 g 0  . warfarin (COUMADIN) 5 MG tablet Take as directed by anticoagulation clinic 30 tablet 3  . zolpidem (AMBIEN) 10 MG tablet TAKE 1 TABLET BY MOUTH AT BEDTIME 30 tablet 5  . [DISCONTINUED] atorvastatin (LIPITOR) 20 MG tablet Take 1 tablet (20 mg total) by mouth daily. 30 tablet 11  . [DISCONTINUED] gabapentin (NEURONTIN) 300 MG capsule Take 1 capsule (300 mg total) by mouth 3 (three) times daily. 90 capsule 5   No current facility-administered medications on file prior to visit.    Review of Systems  Constitutional: Negative for unusual diaphoresis or night sweats HENT: Negative for ear swelling or discharge Eyes: Negative for worsening visual haziness  Respiratory: Negative for choking and stridor.   Gastrointestinal: Negative for distension or worsening eructation Genitourinary: Negative for retention or change in urine volume.  Musculoskeletal: Negative for other MSK pain or swelling Skin: Negative for color change and worsening wound Neurological: Negative for tremors and numbness other than noted  Psychiatric/Behavioral: Negative for decreased concentration or agitation other than above       Objective:   Physical Exam BP 140/80   Pulse 88   Temp 98.4 F (36.9 C) (Oral)   Resp 20   Wt (!) 326 lb (147.9 kg)   SpO2 93%   BMI 46.78 kg/m  VS noted, not ill appearing , morbid obese Constitutional: Pt appears in no apparent distress HENT: Head: NCAT.  Right Ear: External ear normal.  Left Ear: External ear normal.  Eyes: . Pupils are equal, round, and reactive to light. Conjunctivae and EOM are normal Neck: Normal range of motion. Neck supple.  Cardiovascular: Normal rate and regular rhythm.   Pulmonary/Chest: Effort normal and breath sounds without rales or wheezing.  Abd:  Soft, NT,  ND, + BS Neurological: Pt is alert. Not confused ,  motor grossly intact Skin: Skin is warm. No rash, no LE edema Psychiatric: Pt behavior is normal. No agitation. + depressed affect    Assessment & Plan:

## 2016-05-30 NOTE — Assessment & Plan Note (Signed)
Due for INR, unable to get for some reson with fingerstick

## 2016-05-30 NOTE — Progress Notes (Signed)
Carelink Summary Report / Loop Recorder 

## 2016-05-30 NOTE — Assessment & Plan Note (Signed)
stable overall by history and exam, recent data reviewed with pt, and pt to continue medical treatment as before,  to f/u any worsening symptoms or concerns Lab Results  Component Value Date   HGBA1C 5.8 11/29/2015

## 2016-05-30 NOTE — Assessment & Plan Note (Signed)
Pt would be suboptimal candidate, pt insists on trying for general surgury at [facility]

## 2016-05-30 NOTE — Assessment & Plan Note (Signed)
stable overall by history and exam, recent data reviewed with pt, and pt to continue medical treatment as before,  to f/u any worsening symptoms or concerns BP Readings from Last 3 Encounters:  05/30/16 140/80  04/10/16 118/60  03/21/16 91/65

## 2016-05-31 ENCOUNTER — Ambulatory Visit (INDEPENDENT_AMBULATORY_CARE_PROVIDER_SITE_OTHER): Payer: Commercial Managed Care - HMO | Admitting: General Practice

## 2016-05-31 DIAGNOSIS — Z5181 Encounter for therapeutic drug level monitoring: Secondary | ICD-10-CM

## 2016-05-31 NOTE — Patient Instructions (Signed)
Pre visit review using our clinic review tool, if applicable. No additional management support is needed unless otherwise documented below in the visit note. 

## 2016-05-31 NOTE — Progress Notes (Signed)
INR has been addressed with patient and new dosing instructions given.  Patient verbalizes understanding.

## 2016-06-01 ENCOUNTER — Encounter: Payer: Self-pay | Admitting: Internal Medicine

## 2016-06-12 ENCOUNTER — Other Ambulatory Visit: Payer: Self-pay | Admitting: Endocrinology

## 2016-06-12 DIAGNOSIS — G4733 Obstructive sleep apnea (adult) (pediatric): Secondary | ICD-10-CM | POA: Diagnosis not present

## 2016-06-15 ENCOUNTER — Other Ambulatory Visit: Payer: Commercial Managed Care - HMO

## 2016-06-20 ENCOUNTER — Encounter: Payer: Self-pay | Admitting: Endocrinology

## 2016-06-20 ENCOUNTER — Ambulatory Visit (INDEPENDENT_AMBULATORY_CARE_PROVIDER_SITE_OTHER): Payer: Commercial Managed Care - HMO | Admitting: Endocrinology

## 2016-06-20 VITALS — BP 114/72 | HR 94 | Temp 97.8°F | Resp 16 | Ht 70.0 in | Wt 314.4 lb

## 2016-06-20 DIAGNOSIS — E039 Hypothyroidism, unspecified: Secondary | ICD-10-CM | POA: Diagnosis not present

## 2016-06-20 NOTE — Progress Notes (Signed)
Patient ID: KYLIE LOTTI, male   DOB: 12-26-1955, 60 y.o.   MRN: [id number]                                                                                                               Reason for Appointment:   follow-up for thyroid  Referring physician: [name]   History of Present Illness:   Prior to his consultation the patient  had symptoms of palpitations, shakiness, feeling excessively warm and sweaty, and fatigue.  Because of his taking amiodarone for 3 years he has had periodic monitoring of his thyroid levels and was found to be hyperthyroid in 6/16 His amiodarone was stopped in June 2016 He was started on methimazole and the dose was increased to 20 mg twice a day; his free T4 was high at 2.7 in 8/16 Although he did not come back for follow-up and had no other thyroid labs he continued methimazole on his own until 1/17  Even without methimazole in 2/17 his TSH was significantly higher at 9.2 with a low free T4 of 0.5 He appeared asymptomatic at that time He has been on levothyroxine 25 g subsequently  He had left off his medication a few days prior to his visit in 4/17 and free T4 was low again This was resumed and he is taking his levothyroxine in the morning daily His weight fluctuates based on his fluid status  His thyroid levels are showing low normal free T4 with normal TSH   Wt Readings from Last 3 Encounters:  06/20/16 (!) 314 lb 6.4 oz (142.6 kg)  05/30/16 (!) 326 lb (147.9 kg)  04/10/16 (!) 310 lb 12.8 oz (141 kg)    Lab Results  Component Value Date   TSH 1.44 05/30/2016   TSH 1.38 05/30/2016   TSH 2.56 02/10/2016   FREET4 0.67 05/30/2016   FREET4 0.56 (L) 02/10/2016   FREET4 0.76 12/16/2015          Medication List       Accurate as of 06/20/16  1:13 PM. Always use your most recent med list.          ADVAIR DISKUS 250-50 MCG/DOSE Aepb Generic drug:  Fluticasone-Salmeterol INHALE 1 PUFF EVERY DAY AS DIRECTED   albuterol 108 (90 Base)  MCG/ACT inhaler Commonly known as:  PROVENTIL HFA;VENTOLIN HFA Inhale 2 puffs into the lungs 4 times a day as needed   ALPRAZolam 0.5 MG tablet Commonly known as:  XANAX Take 1 tablet (0.5 mg total) by mouth at bedtime as needed for sleep.   carisoprodol 350 MG tablet Commonly known as:  SOMA Take 1 tablet (350 mg total) by mouth 3 (three) times daily as needed for muscle spasms. Take 1 tablet by mouth three times a day as needed in the morning for muscle spasms   diltiazem 120 MG 24 hr capsule Commonly known as:  CARDIZEM CD Take 1 capsule (120 mg total) by mouth 2 (two) times daily.   diphenhydrAMINE 50 MG capsule Commonly known as:  BENADRYL  TAKE ONE CAPSULE BY MOUTH EVERY 6 HOURS AS NEEDED FOR ITCHING   doxepin 25 MG capsule Commonly known as:  SINEQUAN TAKE ONE CAPSULE BY MOUTH TWICE A DAY   fluocinonide cream 0.05 % Commonly known as:  LIDEX Apply 1 application topically 2 (two) times daily.   hydrocortisone 2.5 % cream Apply 1 application topically 2 (two) times daily. itching   hydrOXYzine 25 MG tablet Commonly known as:  ATARAX/VISTARIL TAKE 1 TABLET BY MOUTH 3 TIMES A DAY FOR ITCHING   ibuprofen 200 MG tablet Commonly known as:  ADVIL,MOTRIN Take 400 mg by mouth 2 (two) times daily.   levothyroxine 25 MCG tablet Commonly known as:  SYNTHROID, LEVOTHROID TAKE 1 TABLET BY MOUTH EVERY DAY BEFORE BREAKFAST   linaclotide 145 MCG Caps capsule Commonly known as:  LINZESS Take 145 mcg by mouth daily as needed (Crampy pain).   Loratadine 10 MG Caps Take 10 mg by mouth daily. Reported on 02/16/2016   nebivolol 5 MG tablet Commonly known as:  BYSTOLIC Take 5 mg by mouth daily.   BYSTOLIC 5 MG tablet Generic drug:  nebivolol TAKE 1 AND 1/2 TABLETS BY MOUTH EVERY DAY   omeprazole 20 MG capsule Commonly known as:  PRILOSEC Take 1 capsule (20 mg total) by mouth daily with supper.   potassium chloride SA 20 MEQ tablet Commonly known as:  K-DUR,KLOR-CON Take 40  mEq by mouth daily.   sertraline 100 MG tablet Commonly known as:  ZOLOFT TAKE 2 TABLETS BY MOUTH AT BEDTIME   silver sulfADIAZINE 1 % cream Commonly known as:  SILVADENE APPLY 1 APPLICATION TOPICALLY DAILY.   torsemide 20 MG tablet Commonly known as:  DEMADEX Take 3 tablets (60 mg total) by mouth 2 (two) times daily.   traZODone 100 MG tablet Commonly known as:  DESYREL TAKE 1/2 TABLET BY MOUTH AT BEDTIME AS NEEDED   triamcinolone cream 0.1 % Commonly known as:  KENALOG Apply 1 application topically 2 (two) times daily.   warfarin 5 MG tablet Commonly known as:  COUMADIN Take as directed by anticoagulation clinic   zolpidem 10 MG tablet Commonly known as:  AMBIEN TAKE 1 TABLET BY MOUTH AT BEDTIME           Past Medical History:  Diagnosis Date  . Anemia    supposed to be taking Vit B but doesn't  . ANXIETY    takes Xanax nightly  . Arthritis   . ASTHMA    Albuterol prn and Advair daily;also takes Prednisone daily  . Atrial fibrillation ([place]) 09/06/2015  . Cardiomyopathy [facility])    a. EF 25% TEE July 2013; b. EF normalized 2015;  c. 03/2015 Echo: EF 40-45%, difrf HK, PASP 38 mmHg, Mild MR, sev LAE/RAE.  [name] Chronic constipation    takes OTC stool softener  . COPD (chronic obstructive pulmonary disease) ([place])   . DEPRESSION    takes Zoloft and Doxepin daily  . Diverticulitis   . DYSKINESIA, ESOPHAGUS   . Essential hypertension       . FIBROMYALGIA   . GERD (gastroesophageal reflux disease)       . Glaucoma   . HYPERLIPIDEMIA    a. Intolerant to statins.  . INSOMNIA    takes Ambien nightly  . Myocardial infarction [facility])    a. 2012 Myoview notable for prior infarct;  b. 03/2015 Lexiscan CL: EF 37%, diff HK, small area of inferior infarct from apex to base-->Med Rx.  . O2 dependent    uses oxygen  2 and 1/2 liters per [place] at hs  . Paroxysmal atrial fibrillation (HCC)    a. CHA2DS2VASc = 3--> takes Coumadin;  b. 03/15/2015 Successful TEE/DCCV;  c. 03/2015 recurrent  afib, Amio d/c'd in setting of hyperthyroidism.  . Peripheral neuropathy ([place])   . Rash and other nonspecific skin eruption 04/12/2009   no cause found saw dermatologists x 2 and allergist  . SLEEP APNEA, OBSTRUCTIVE    a. doesn't use CPAP  . Syncope    a. 03/2015 s/p MDT LINQ.  [name] Type II diabetes mellitus ([place])         Past Surgical History:  Procedure Laterality Date  . ACNE CYST REMOVAL     2 on back   . CARDIAC CATHETERIZATION N/A 03/21/2016   Procedure: Right/Left Heart Cath and Coronary Angiography;  Surgeon: Larey Dresser, MD;  Location: [place] CV LAB;  Service: Cardiovascular;  Laterality: N/A;  . CARDIOVERSION  04/18/2012   Procedure: CARDIOVERSION;  Surgeon: Fay Records, MD;  Location: [place];  Service: Cardiovascular;  Laterality: N/A;  . CARDIOVERSION  04/25/2012   Procedure: CARDIOVERSION;  Surgeon: Thayer Headings, MD;  Location: [place];  Service: Cardiovascular;  Laterality: N/A;  . CARDIOVERSION  04/25/2012   Procedure: CARDIOVERSION;  Surgeon: Fay Records, MD;  Location: [place];  Service: Cardiovascular;  Laterality: N/A;  . CARDIOVERSION  05/09/2012   Procedure: CARDIOVERSION;  Surgeon: Sherren Mocha, MD;  Location: [place];  Service: Cardiovascular;  Laterality: N/A;  changed from crenshaw to cooper by trish/leone-endo  . CARDIOVERSION N/A 03/15/2015   Procedure: CARDIOVERSION;  Surgeon: Thayer Headings, MD;  Location: [facility] ENDOSCOPY;  Service: Cardiovascular;  Laterality: N/A;  . COLONOSCOPY    . COLONOSCOPY WITH PROPOFOL N/A 10/21/2014   Procedure: COLONOSCOPY WITH PROPOFOL;  Surgeon: Ladene Artist, MD;  Location: [facility] ENDOSCOPY;  Service: Endoscopy;  Laterality: N/A;  . EP IMPLANTABLE DEVICE N/A 04/06/2015   Procedure: Loop Recorder Insertion;  Surgeon: Evans Lance, MD;  Location: [place] CV LAB;  Service: Cardiovascular;  Laterality: N/A;  . ESOPHAGOGASTRODUODENOSCOPY    . TEE WITHOUT CARDIOVERSION  04/25/2012   Procedure: TRANSESOPHAGEAL ECHOCARDIOGRAM  (TEE);  Surgeon: Thayer Headings, MD;  Location: [place];  Service: Cardiovascular;  Laterality: N/A;  . TEE WITHOUT CARDIOVERSION N/A 03/15/2015   Procedure: TRANSESOPHAGEAL ECHOCARDIOGRAM (TEE);  Surgeon: Thayer Headings, MD;  Location: [place];  Service: Cardiovascular;  Laterality: N/A;  . TONSILLECTOMY    . TOTAL KNEE ARTHROPLASTY Right 06/15/2014   Procedure: TOTAL KNEE ARTHROPLASTY;  Surgeon: Renette Butters, MD;  Location: [place];  Service: Orthopedics;  Laterality: Right;    Family History  Problem Relation Age of Onset  . COPD Mother   . Asthma Mother   . Colon polyps Mother   . Allergies Mother   . Hypothyroidism Mother   . Asthma Maternal Grandmother   . Colon cancer Neg Hx     Social History:  reports that he quit smoking about 8 years ago. His smoking use included Cigarettes. He has a 60.00 pack-year smoking history. He has never used smokeless tobacco. He reports that he does not drink alcohol or use drugs.  Allergies:  Allergies  Allergen Reactions  . Pravastatin Sodium Other (See Comments)    myalgia  . Simvastatin Other (See Comments)    severe leg cramps  . Statins Other (See Comments)    myalgia  . Amiodarone     hyperthyroidism  . Tape Other (See Comments)  Skin Tears Use Paper Tape Only    Review of Systems:  ROS    He had been diagnosed to have mild diabetes but blood sugars have been controlled, Followed by PCP    Lab Results  Component Value Date   HGBA1C 5.9 05/30/2016     Examination:   BP 114/72   Pulse 94   Temp 97.8 F (36.6 C)   Resp 16   Ht 5\' 10"  (1.778 m)   Wt (!) 314 lb 6.4 oz (142.6 kg)   SpO2 96%   BMI 45.11 kg/m   Thyroid is just palpable on swallowing, firm, mostly on the right side His reflexes at biceps are difficult to elicit but appear normal    Assessment/Plan:  Probable mild secondary hypothyroidism with persistently low free T4 level: He has not had any significant symptoms although his not  usually very active  Currently with 25 g levothyroxine his free T4 is low normal, TSH normal again  Will adjust the dose further on his follow-up visit in 4 months  Multinodular goiter on exam, small and stable       Anette Barra 06/20/2016, 1:13 PM

## 2016-06-26 LAB — CUP PACEART REMOTE DEVICE CHECK: Date Time Interrogation Session: 20170816150626

## 2016-06-29 ENCOUNTER — Ambulatory Visit (INDEPENDENT_AMBULATORY_CARE_PROVIDER_SITE_OTHER): Payer: Commercial Managed Care - HMO | Admitting: *Deleted

## 2016-06-29 DIAGNOSIS — R55 Syncope and collapse: Secondary | ICD-10-CM

## 2016-06-29 NOTE — Progress Notes (Signed)
Carelink Summary Report / Loop Recorder 

## 2016-07-07 ENCOUNTER — Other Ambulatory Visit: Payer: Self-pay | Admitting: Internal Medicine

## 2016-07-09 ENCOUNTER — Other Ambulatory Visit: Payer: Self-pay | Admitting: Internal Medicine

## 2016-07-10 ENCOUNTER — Other Ambulatory Visit: Payer: Self-pay | Admitting: General Practice

## 2016-07-10 MED ORDER — WARFARIN SODIUM 5 MG PO TABS: ORAL_TABLET | ORAL | 3 refills | Status: DC

## 2016-07-13 ENCOUNTER — Encounter: Payer: Self-pay | Admitting: Internal Medicine

## 2016-07-13 ENCOUNTER — Ambulatory Visit (INDEPENDENT_AMBULATORY_CARE_PROVIDER_SITE_OTHER): Payer: Commercial Managed Care - HMO | Admitting: General Practice

## 2016-07-13 ENCOUNTER — Ambulatory Visit (INDEPENDENT_AMBULATORY_CARE_PROVIDER_SITE_OTHER): Payer: Commercial Managed Care - HMO | Admitting: Internal Medicine

## 2016-07-13 VITALS — BP 110/80 | HR 57 | Ht 70.0 in | Wt 308.4 lb

## 2016-07-13 DIAGNOSIS — Z5181 Encounter for therapeutic drug level monitoring: Secondary | ICD-10-CM | POA: Diagnosis not present

## 2016-07-13 DIAGNOSIS — I4510 Unspecified right bundle-branch block: Secondary | ICD-10-CM

## 2016-07-13 DIAGNOSIS — I429 Cardiomyopathy, unspecified: Secondary | ICD-10-CM

## 2016-07-13 DIAGNOSIS — I481 Persistent atrial fibrillation: Secondary | ICD-10-CM

## 2016-07-13 DIAGNOSIS — I255 Ischemic cardiomyopathy: Secondary | ICD-10-CM

## 2016-07-13 DIAGNOSIS — I428 Other cardiomyopathies: Secondary | ICD-10-CM

## 2016-07-13 DIAGNOSIS — Z23 Encounter for immunization: Secondary | ICD-10-CM

## 2016-07-13 DIAGNOSIS — G4733 Obstructive sleep apnea (adult) (pediatric): Secondary | ICD-10-CM | POA: Diagnosis not present

## 2016-07-13 DIAGNOSIS — I4819 Other persistent atrial fibrillation: Secondary | ICD-10-CM

## 2016-07-13 LAB — POCT INR: INR: 1.8

## 2016-07-13 NOTE — Progress Notes (Signed)
Patient Care Team: [name], MD as PCP - [place], RD (Inactive) as Dietitian   HPI  [name] is a 60 y.o. male seen in followup for atrial fibrillation for which he is treated with amiodarone.  There was some concern regarding dyspnea so he underwent pulmonary function testing by Dr. Gwenette Greet;  he also has a history of syncope and is status post ILR insertion 6/16  He continues to struggle with shortness of breath. He also significant fatigue. He has sleep apnea but does not use CPAP. He does use oxygen.    Echocardiogram 7/13 demonstrated EF 20-25% with severe biatrial enlargement. Repeat echo 8/14 and demonstrated an intercurrent normalization of LV function with biatrial enlargement and mild RV dilatation       DATE TEST     /2012 myoview   EF44 Prior IMI perfsion defect  /2015 myoview   EF57% Perfusion defect--MI vs bowel  2016 myoview 35-40 NO perfusion defect  6/16 Echo 40-45 Severe BAE  6/17 Cath 30 Global Hypokinesis With normal CA And normal pulm    Antiarrhythmics Date  dofetilide 2013  amidoarone 2016     Holter 6/17 HR with average rates at night 7-9pm >100  No recent antiarrhythmic therapy; permanent atrial fibrillation. No interval syncope.  No chest pain shortness of breath is stable edema is minimal  He had right bundle branch block/LAD             Past Medical History:  Diagnosis Date  . Anemia    supposed to be taking Vit B but doesn't  . ANXIETY    takes Xanax nightly  . Arthritis   . ASTHMA    Albuterol prn and Advair daily;also takes Prednisone daily  . Atrial fibrillation ([place]) 09/06/2015  . Cardiomyopathy [facility])    a. EF 25% TEE July 2013; b. EF normalized 2015;  c. 03/2015 Echo: EF 40-45%, difrf HK, PASP 38 mmHg, Mild MR, sev LAE/RAE.  [name] Chronic constipation    takes OTC stool softener  . COPD (chronic obstructive pulmonary disease) ([place])   . DEPRESSION    takes Zoloft and Doxepin daily  .  Diverticulitis   . DYSKINESIA, ESOPHAGUS   . Essential hypertension       . FIBROMYALGIA   . GERD (gastroesophageal reflux disease)       . Glaucoma   . HYPERLIPIDEMIA    a. Intolerant to statins.  . INSOMNIA    takes Ambien nightly  . Myocardial infarction [facility])    a. 2012 Myoview notable for prior infarct;  b. 03/2015 Lexiscan CL: EF 37%, diff HK, small area of inferior infarct from apex to base-->Med Rx.  . O2 dependent    uses oxygen 2 and 1/2 liters per [place] at hs  . Paroxysmal atrial fibrillation (HCC)    a. CHA2DS2VASc = 3--> takes Coumadin;  b. 03/15/2015 Successful TEE/DCCV;  c. 03/2015 recurrent afib, Amio d/c'd in setting of hyperthyroidism.  . Peripheral neuropathy ([place])   . Rash and other nonspecific skin eruption 04/12/2009   no cause found saw dermatologists x 2 and allergist  . SLEEP APNEA, OBSTRUCTIVE    a. doesn't use CPAP  . Syncope    a. 03/2015 s/p MDT LINQ.  [name] Type II diabetes mellitus ([place])         Past Surgical History:  Procedure Laterality Date  . ACNE CYST REMOVAL     2 on back   . CARDIAC CATHETERIZATION  N/A 03/21/2016   Procedure: Right/Left Heart Cath and Coronary Angiography;  Surgeon: Larey Dresser, MD;  Location: [place] CV LAB;  Service: Cardiovascular;  Laterality: N/A;  . CARDIOVERSION  04/18/2012   Procedure: CARDIOVERSION;  Surgeon: Fay Records, MD;  Location: [place];  Service: Cardiovascular;  Laterality: N/A;  . CARDIOVERSION  04/25/2012   Procedure: CARDIOVERSION;  Surgeon: Thayer Headings, MD;  Location: [place];  Service: Cardiovascular;  Laterality: N/A;  . CARDIOVERSION  04/25/2012   Procedure: CARDIOVERSION;  Surgeon: Fay Records, MD;  Location: [place];  Service: Cardiovascular;  Laterality: N/A;  . CARDIOVERSION  05/09/2012   Procedure: CARDIOVERSION;  Surgeon: Sherren Mocha, MD;  Location: [place];  Service: Cardiovascular;  Laterality: N/A;  changed from crenshaw to cooper by trish/leone-endo  . CARDIOVERSION N/A 03/15/2015    Procedure: CARDIOVERSION;  Surgeon: Thayer Headings, MD;  Location: [facility] ENDOSCOPY;  Service: Cardiovascular;  Laterality: N/A;  . COLONOSCOPY    . COLONOSCOPY WITH PROPOFOL N/A 10/21/2014   Procedure: COLONOSCOPY WITH PROPOFOL;  Surgeon: Ladene Artist, MD;  [facility] ENDOS[facility];  Service: Endoscopy;  Laterality: N/A;  . EP IMPLANTABLE DEVICE N/A 04/06/2015   Procedure: Loop Recorder Insertion;  Surgeon: Evans Lance, MD;  Location: [place] CV LAB;  Service: Cardiovascular;  Laterality: N/A;  . ESOPHAGOGASTRODUODENOSCOPY    . TEE WITHOUT CARDIOVERSION  04/25/2012   Procedure: TRANSESOPHAGEAL ECHOCARDIOGRAM (TEE);  Surgeon: Thayer Headings, MD;  Location: [place];  Service: Cardiovascular;  Laterality: N/A;  . TEE WITHOUT CARDIOVERSION N/A 03/15/2015   Procedure: TRANSESOPHAGEAL ECHOCARDIOGRAM (TEE);  Surgeon: Thayer Headings, MD;  Location: [place];  Service: Cardiovascular;  Laterality: N/A;  . TONSILLECTOMY    . TOTAL KNEE ARTHROPLASTY Right 06/15/2014   Procedure: TOTAL KNEE ARTHROPLASTY;  Surgeon: Renette Butters, MD;  Location: [place];  Service: Orthopedics;  Laterality: Right;    Current Outpatient Prescriptions  Medication Sig Dispense Refill  . ADVAIR DISKUS 250-50 MCG/DOSE AEPB INHALE 1 PUFF EVERY DAY AS DIRECTED 60 each 3  . albuterol (PROVENTIL HFA;VENTOLIN HFA) 108 (90 BASE) MCG/ACT inhaler Inhale 2 puffs into the lungs 4 times a day as needed    . ALPRAZolam (XANAX) 0.5 MG tablet Take 1 tablet (0.5 mg total) by mouth at bedtime as needed for sleep. 90 tablet 1  . BYSTOLIC 5 MG tablet TAKE 1 AND 1/2 TABLETS BY MOUTH EVERY DAY 45 tablet 2  . carisoprodol (SOMA) 350 MG tablet Take 1 tablet (350 mg total) by mouth 3 (three) times daily as needed for muscle spasms. Take 1 tablet by mouth three times a day as needed in the morning for muscle spasms 90 tablet 5  . diltiazem (CARDIZEM CD) 120 MG 24 hr capsule Take 1 capsule (120 mg total) by mouth 2 (two) times daily. 30  capsule 11  . diphenhydrAMINE (BENADRYL) 50 MG capsule TAKE ONE CAPSULE BY MOUTH EVERY 6 HOURS AS NEEDED FOR ITCHING 30 capsule 0  . doxepin (SINEQUAN) 25 MG capsule TAKE ONE CAPSULE BY MOUTH TWICE A DAY 180 capsule 1  . fluocinonide cream (LIDEX) [id number] % Apply 1 application topically 2 (two) times daily. 30 g 0  . hydrocortisone 2.5 % cream Apply 1 application topically 2 (two) times daily. itching    . hydrocortisone 2.5 % cream APPLY TWICE A DAY 28.35 g 5  . hydrOXYzine (ATARAX/VISTARIL) 25 MG tablet TAKE 1 TABLET BY MOUTH 3 TIMES A DAY FOR ITCHING 30 tablet 1  .  ibuprofen (ADVIL,MOTRIN) 200 MG tablet Take 400 mg by mouth 2 (two) times daily.    [name] levothyroxine (SYNTHROID, LEVOTHROID) 25 MCG tablet TAKE 1 TABLET BY MOUTH EVERY DAY BEFORE BREAKFAST 30 tablet 3  . Linaclotide (LINZESS) 145 MCG CAPS capsule Take 145 mcg by mouth daily as needed (Crampy pain).     . Loratadine 10 MG CAPS Take 10 mg by mouth daily. Reported on 02/16/2016    . nebivolol (BYSTOLIC) 5 MG tablet Take 5 mg by mouth daily.    [name] omeprazole (PRILOSEC) 20 MG capsule Take 1 capsule (20 mg total) by mouth daily with supper. 90 capsule 1  . potassium chloride SA (K-DUR,KLOR-CON) 20 MEQ tablet Take 40 mEq by mouth daily.    . sertraline (ZOLOFT) 100 MG tablet TAKE 2 TABLETS BY MOUTH AT BEDTIME 180 tablet 3  . silver sulfADIAZINE (SILVADENE) 1 % cream APPLY 1 APPLICATION TOPICALLY DAILY. 50 g 0  . torsemide (DEMADEX) 20 MG tablet Take 3 tablets (60 mg total) by mouth 2 (two) times daily. 180 tablet 3  . traZODone (DESYREL) 100 MG tablet TAKE 1/2 TABLET BY MOUTH AT BEDTIME AS NEEDED 45 tablet 1  . warfarin (COUMADIN) 5 MG tablet Take as directed by anticoagulation clinic 30 tablet 3  . zolpidem (AMBIEN) 10 MG tablet TAKE 1 TABLET BY MOUTH AT BEDTIME 30 tablet 5   No current facility-administered medications for this visit.     Allergies  Allergen Reactions  . Pravastatin Sodium Other (See Comments)    myalgia  . Simvastatin  Other (See Comments)    severe leg cramps  . Statins Other (See Comments)    myalgia  . Amiodarone     hyperthyroidism  . Tape Other (See Comments)    Skin Tears Use Paper Tape Only    Review of Systems negative except from HPI and PMH  Physical Exam BP 110/80   Pulse (!) 57   Ht 5\' 10"  (1.778 m)   Wt (!) 308 lb 6.4 oz (139.9 kg)   SpO2 95%   BMI 44.25 kg/m  Well developed andmorbidly obese in no acute distress HENT normal E scleral and icterus clear JVP not discernible Neck Supple Clear to ausculation Irregularly  irregular rate and rhythm, no murmurs gallops or rub Soft with active bowel sounds No clubbing cyanosis  1+Edema Alert and oriented, grossly normal motor and sensory function Affect flat Skin Warm and Dry  ECG  June 1 atrial fibrillation at 75 intervals-/18/43 Axis left -85   Assessment and  Plan  Ischemic heart disease withoout significant obstructive disease by catheterization 6/17  Preoperative evaluation  HFpEF    Cardiomyopathy-ischemic/nonischemic- resolved   Atrial fibrillation persistent/permanent  Hyperthyroidism >>euthyroid   RBBB  Bradycardia  Syncope     We will leave his medications for rate control as they are given his resting bradycardia. He is anticipating gastric bypass surgery. I think his risks should be acceptable. Recent cardiac evaluation was very reassuring  Atrial fibrillation is now permanent. Rate control is not ideal; however, with resting bradycardia I'm reluctant to increase medication significantly. Hopefully with gastric bypass, the stresses on the system will be less and his rate with exertion will be lower   No interval syncope   Nocturnal pausing consistent with obstructive sleep apnea   His mild volume overload. This is chronic and stable; will not adjust his medications

## 2016-07-13 NOTE — Progress Notes (Signed)
I have reviewed and agree with the plan. 

## 2016-07-13 NOTE — Patient Instructions (Signed)
Medication Instructions: - Your physician recommends that you continue on your current medications as directed. Please refer to the Current Medication list given to you today.  Labwork: - none ordered  Procedures/Testing: - none ordered  Follow-Up: - Your physician wants you to follow-up in: 6 months with Donna Carroll, NP in the A-fib Clinic & 1 year with Dr. Klein.  You will receive a reminder letter in the mail two months in advance. If you don't receive a letter, please call our office to schedule the follow-up appointment.   Any Additional Special Instructions Will Be Listed Below (If Applicable).     If you need a refill on your cardiac medications before your next appointment, please call your pharmacy.   

## 2016-07-17 LAB — CUP PACEART INCLINIC DEVICE CHECK: Date Time Interrogation Session: 20170929173022

## 2016-07-21 LAB — CUP PACEART REMOTE DEVICE CHECK: MDC IDC SESS DTM: 20170915153658

## 2016-07-21 NOTE — Progress Notes (Signed)
Carelink summary report received. Battery status OK. Normal device function. No new symptom episodes, tachy episodes, brady episodes.  >99% AF, +Warfarin.  1 nocturnal pause.  Monthly summary reports and ROV/PRN

## 2016-07-25 ENCOUNTER — Encounter: Payer: Self-pay | Admitting: Internal Medicine

## 2016-07-25 ENCOUNTER — Telehealth: Payer: Self-pay | Admitting: Cardiology

## 2016-07-25 NOTE — Telephone Encounter (Signed)
Remote transmission received. Current ECG shows AF/RVR.   Spoke to patient about sx's. Patient states that he feels his heart racing and it is making him nervous. Patient denies any CP. I explained to patient that that his loop recorder shows that he is currently in AF/RVR. I asked patient about his current use of his inhalers. Patient admits to using his inhalers more over the last 3 days d/t the recent humidity. I asked patient about his current medications. Patient states that he has only been taking his Bystolic 5mg  daily (for insurance reasons) and he has only been taking his Diltiazem 120mg  once daily bc that's how he thought he was supposed to take it. I asked patient if he is able to obtain his BP, but he is unable to do so.  I spoke to [place] regarding patient sx's and transmission. Dr.Klein recommended that patient increase his Diltiazem to 120mg  BID.  I spoke to patient about Dr.Klein's recommendations. Patient voiced understanding. I encouraged patient to call if he has any further sx's.

## 2016-07-25 NOTE — Telephone Encounter (Signed)
Pt called and stated that he is experiencing some high heart rates and his heart feels like it is racing. He is having shortness of breath. He stated that the shortness of breath is more than normal. Pt stated that he has been dizzy. Instructed pt to send a manual transmission.

## 2016-07-30 ENCOUNTER — Ambulatory Visit (INDEPENDENT_AMBULATORY_CARE_PROVIDER_SITE_OTHER): Payer: Commercial Managed Care - HMO | Admitting: *Deleted

## 2016-07-30 DIAGNOSIS — R55 Syncope and collapse: Secondary | ICD-10-CM

## 2016-07-30 NOTE — Progress Notes (Signed)
Carelink Summary Report / Loop Recorder 

## 2016-07-31 ENCOUNTER — Other Ambulatory Visit: Payer: Self-pay | Admitting: Internal Medicine

## 2016-08-10 ENCOUNTER — Ambulatory Visit (INDEPENDENT_AMBULATORY_CARE_PROVIDER_SITE_OTHER): Payer: Commercial Managed Care - HMO | Admitting: General Practice

## 2016-08-10 DIAGNOSIS — Z5181 Encounter for therapeutic drug level monitoring: Secondary | ICD-10-CM

## 2016-08-10 LAB — POCT INR: INR: 2.5

## 2016-08-10 NOTE — Patient Instructions (Signed)
Pre visit review using our clinic review tool, if applicable. No additional management support is needed unless otherwise documented below in the visit note. 

## 2016-08-10 NOTE — Progress Notes (Signed)
I have reviewed and agree with the plan. 

## 2016-08-12 DIAGNOSIS — G4733 Obstructive sleep apnea (adult) (pediatric): Secondary | ICD-10-CM | POA: Diagnosis not present

## 2016-08-14 ENCOUNTER — Telehealth: Payer: Self-pay | Admitting: Internal Medicine

## 2016-08-14 DIAGNOSIS — I428 Other cardiomyopathies: Secondary | ICD-10-CM

## 2016-08-14 DIAGNOSIS — I255 Ischemic cardiomyopathy: Secondary | ICD-10-CM

## 2016-08-14 DIAGNOSIS — I4819 Other persistent atrial fibrillation: Secondary | ICD-10-CM

## 2016-08-14 MED ORDER — ALPRAZOLAM 0.5 MG PO TABS: ORAL_TABLET | ORAL | 1 refills | Status: DC

## 2016-08-14 NOTE — Telephone Encounter (Signed)
Likely this was an error  I re-did rx  Please call his pharmacy to cancel refills of original rx

## 2016-08-14 NOTE — Telephone Encounter (Signed)
Notified pt w/MD response. Called in new rx spoke with pharmacist Adonis Huguenin cancel alprazolam script on file & gave new rx for alprazolam 0.5 mg to take 1-2 tablets at bedtime...Johny Chess

## 2016-08-14 NOTE — Telephone Encounter (Signed)
Received an email from the office of patient experience. Called patient. He is concerned about his alprazolam dosage being changed without him being told about it. In following the xanax history, I am unable to find any notes as to the rationale of medication changes. Please follow up and advise patient as to why the xanax sig changed from 2 tablets at bedtime to 1 tablet.

## 2016-08-15 DIAGNOSIS — E785 Hyperlipidemia, unspecified: Secondary | ICD-10-CM | POA: Insufficient documentation

## 2016-08-15 DIAGNOSIS — Z6841 Body Mass Index (BMI) 40.0 and over, adult: Secondary | ICD-10-CM | POA: Diagnosis not present

## 2016-08-15 DIAGNOSIS — K219 Gastro-esophageal reflux disease without esophagitis: Secondary | ICD-10-CM | POA: Diagnosis not present

## 2016-08-15 DIAGNOSIS — K449 Diaphragmatic hernia without obstruction or gangrene: Secondary | ICD-10-CM | POA: Diagnosis not present

## 2016-08-17 ENCOUNTER — Other Ambulatory Visit: Payer: Self-pay | Admitting: Internal Medicine

## 2016-08-17 DIAGNOSIS — I255 Ischemic cardiomyopathy: Secondary | ICD-10-CM

## 2016-08-17 DIAGNOSIS — I428 Other cardiomyopathies: Secondary | ICD-10-CM

## 2016-08-17 DIAGNOSIS — I4819 Other persistent atrial fibrillation: Secondary | ICD-10-CM

## 2016-08-17 MED ORDER — DILTIAZEM HCL ER COATED BEADS 120 MG PO CP24: 120.0000 mg | ORAL_CAPSULE | Freq: Two times a day (BID) | ORAL | 2 refills | Status: DC

## 2016-08-20 ENCOUNTER — Other Ambulatory Visit: Payer: Self-pay

## 2016-08-20 MED ORDER — LEVOTHYROXINE SODIUM 25 MCG PO TABS: ORAL_TABLET | ORAL | 2 refills | Status: DC

## 2016-08-22 ENCOUNTER — Other Ambulatory Visit (HOSPITAL_COMMUNITY): Payer: Self-pay | Admitting: Surgical Oncology

## 2016-08-22 ENCOUNTER — Telehealth: Payer: Self-pay | Admitting: Endocrinology

## 2016-08-22 DIAGNOSIS — K219 Gastro-esophageal reflux disease without esophagitis: Secondary | ICD-10-CM

## 2016-08-22 DIAGNOSIS — I428 Other cardiomyopathies: Secondary | ICD-10-CM

## 2016-08-22 DIAGNOSIS — J439 Emphysema, unspecified: Secondary | ICD-10-CM

## 2016-08-22 NOTE — Telephone Encounter (Signed)
[facility] Call: Caller states Dr. Durene Cal Rx to wrong pharmacy the mail order, [facility] [facility].  He [facility] [address].

## 2016-08-23 ENCOUNTER — Other Ambulatory Visit: Payer: Self-pay

## 2016-08-23 MED ORDER — LEVOTHYROXINE SODIUM 25 MCG PO TABS: ORAL_TABLET | ORAL | 2 refills | Status: DC

## 2016-08-23 NOTE — Telephone Encounter (Signed)
Need to know what prescription patient is talking about

## 2016-08-24 NOTE — Telephone Encounter (Signed)
[facility]othyroxine needs to be [facility] on [address]  Please remove [employer]

## 2016-08-25 ENCOUNTER — Other Ambulatory Visit: Payer: Self-pay | Admitting: Internal Medicine

## 2016-08-27 ENCOUNTER — Ambulatory Visit (HOSPITAL_COMMUNITY): Admission: RE | Admit: 2016-08-27 | Payer: Commercial Managed Care - HMO | Source: Ambulatory Visit

## 2016-08-27 ENCOUNTER — Other Ambulatory Visit: Payer: Self-pay

## 2016-08-27 MED ORDER — LEVOTHYROXINE SODIUM 25 MCG PO TABS: ORAL_TABLET | ORAL | 2 refills | Status: DC

## 2016-08-27 MED ORDER — OMEPRAZOLE 20 MG PO CPDR: 20.0000 mg | DELAYED_RELEASE_CAPSULE | Freq: Every day | ORAL | 1 refills | Status: DC

## 2016-08-27 NOTE — Telephone Encounter (Signed)
Ordered 08/2016

## 2016-08-28 ENCOUNTER — Ambulatory Visit (INDEPENDENT_AMBULATORY_CARE_PROVIDER_SITE_OTHER): Payer: Commercial Managed Care - HMO | Admitting: *Deleted

## 2016-08-28 ENCOUNTER — Other Ambulatory Visit (HOSPITAL_COMMUNITY): Payer: Self-pay | Admitting: Surgical Oncology

## 2016-08-28 DIAGNOSIS — R55 Syncope and collapse: Secondary | ICD-10-CM

## 2016-08-28 DIAGNOSIS — J439 Emphysema, unspecified: Secondary | ICD-10-CM

## 2016-08-28 DIAGNOSIS — K219 Gastro-esophageal reflux disease without esophagitis: Secondary | ICD-10-CM

## 2016-08-28 NOTE — Progress Notes (Signed)
Carelink Summary Report / Loop Recorder 

## 2016-08-29 ENCOUNTER — Ambulatory Visit (HOSPITAL_COMMUNITY)
Admission: RE | Admit: 2016-08-29 | Discharge: 2016-08-29 | Disposition: A | Discharge status: Home / Self Care | Payer: Commercial Managed Care - HMO | Source: Ambulatory Visit | Attending: Surgical Oncology | Admitting: Surgical Oncology

## 2016-08-29 DIAGNOSIS — K219 Gastro-esophageal reflux disease without esophagitis: Secondary | ICD-10-CM | POA: Insufficient documentation

## 2016-08-29 DIAGNOSIS — J439 Emphysema, unspecified: Secondary | ICD-10-CM

## 2016-08-29 DIAGNOSIS — Z01818 Encounter for other preprocedural examination: Secondary | ICD-10-CM | POA: Diagnosis not present

## 2016-08-29 DIAGNOSIS — I428 Other cardiomyopathies: Secondary | ICD-10-CM | POA: Diagnosis not present

## 2016-08-30 ENCOUNTER — Other Ambulatory Visit: Payer: Self-pay | Admitting: Internal Medicine

## 2016-09-01 LAB — CUP PACEART REMOTE DEVICE CHECK
MDC IDC PG IMPLANT DT: 20160622
MDC IDC SESS DTM: 20171015173547

## 2016-09-01 NOTE — Progress Notes (Signed)
Carelink summary report received. Battery status OK. Normal device function. No new symptom episodes, tachy episodes, brady, or pause episodes. 99% AF, V rates controlled, +Warfarin.  Monthly summary reports and ROV/PRN

## 2016-09-03 ENCOUNTER — Ambulatory Visit (HOSPITAL_COMMUNITY): Payer: Commercial Managed Care - HMO

## 2016-09-12 DIAGNOSIS — G4733 Obstructive sleep apnea (adult) (pediatric): Secondary | ICD-10-CM | POA: Diagnosis not present

## 2016-09-14 ENCOUNTER — Other Ambulatory Visit: Payer: Self-pay | Admitting: Internal Medicine

## 2016-09-14 ENCOUNTER — Ambulatory Visit (INDEPENDENT_AMBULATORY_CARE_PROVIDER_SITE_OTHER): Payer: Commercial Managed Care - HMO | Admitting: General Practice

## 2016-09-14 DIAGNOSIS — Z5181 Encounter for therapeutic drug level monitoring: Secondary | ICD-10-CM | POA: Diagnosis not present

## 2016-09-14 LAB — POCT INR: INR: 2.9

## 2016-09-14 NOTE — Progress Notes (Signed)
I have reviewed and agree with the plan. 

## 2016-09-14 NOTE — Patient Instructions (Addendum)
Pre visit review using our clinic review tool, if applicable. No additional management support is needed unless otherwise documented below in the visit note. 

## 2016-09-27 ENCOUNTER — Ambulatory Visit (INDEPENDENT_AMBULATORY_CARE_PROVIDER_SITE_OTHER): Payer: Commercial Managed Care - HMO | Admitting: *Deleted

## 2016-09-27 DIAGNOSIS — R55 Syncope and collapse: Secondary | ICD-10-CM | POA: Diagnosis not present

## 2016-09-28 NOTE — Progress Notes (Signed)
Carelink Summary Report / Loop Recorder 

## 2016-10-11 LAB — CUP PACEART REMOTE DEVICE CHECK
Date Time Interrogation Session: 20171114183753
Implantable Pulse Generator Implant Date: 20160622

## 2016-10-12 DIAGNOSIS — G4733 Obstructive sleep apnea (adult) (pediatric): Secondary | ICD-10-CM | POA: Diagnosis not present

## 2016-10-15 ENCOUNTER — Other Ambulatory Visit: Payer: Self-pay | Admitting: Internal Medicine

## 2016-10-16 ENCOUNTER — Other Ambulatory Visit: Payer: Commercial Managed Care - HMO

## 2016-10-18 ENCOUNTER — Ambulatory Visit: Payer: Commercial Managed Care - HMO | Admitting: Endocrinology

## 2016-10-19 ENCOUNTER — Ambulatory Visit: Payer: Commercial Managed Care - HMO

## 2016-10-24 ENCOUNTER — Other Ambulatory Visit: Payer: Self-pay | Admitting: Internal Medicine

## 2016-10-24 DIAGNOSIS — I428 Other cardiomyopathies: Secondary | ICD-10-CM

## 2016-10-24 DIAGNOSIS — I255 Ischemic cardiomyopathy: Secondary | ICD-10-CM

## 2016-10-24 DIAGNOSIS — I4819 Other persistent atrial fibrillation: Secondary | ICD-10-CM

## 2016-10-24 NOTE — Telephone Encounter (Signed)
Both Done hardcopy to Corinne ' 

## 2016-10-25 NOTE — Telephone Encounter (Signed)
faxed

## 2016-10-26 ENCOUNTER — Ambulatory Visit (INDEPENDENT_AMBULATORY_CARE_PROVIDER_SITE_OTHER): Payer: Medicare HMO | Admitting: General Practice

## 2016-10-26 DIAGNOSIS — Z5181 Encounter for therapeutic drug level monitoring: Secondary | ICD-10-CM

## 2016-10-26 LAB — POCT INR: INR: 3.6

## 2016-10-26 NOTE — Patient Instructions (Signed)
Pre visit review using our clinic review tool, if applicable. No additional management support is needed unless otherwise documented below in the visit note. 

## 2016-10-27 NOTE — Progress Notes (Signed)
I have reviewed and agree with the plan. 

## 2016-10-29 ENCOUNTER — Other Ambulatory Visit (INDEPENDENT_AMBULATORY_CARE_PROVIDER_SITE_OTHER): Payer: Medicare HMO

## 2016-10-29 ENCOUNTER — Ambulatory Visit (INDEPENDENT_AMBULATORY_CARE_PROVIDER_SITE_OTHER): Payer: Medicare HMO | Admitting: *Deleted

## 2016-10-29 DIAGNOSIS — R55 Syncope and collapse: Secondary | ICD-10-CM | POA: Diagnosis not present

## 2016-10-29 DIAGNOSIS — E039 Hypothyroidism, unspecified: Secondary | ICD-10-CM

## 2016-10-29 LAB — TSH: TSH: 1.57 u[IU]/mL (ref 0.35–4.50)

## 2016-10-29 LAB — T4, FREE: FREE T4: 0.78 ng/dL (ref 0.60–1.60)

## 2016-10-30 NOTE — Progress Notes (Signed)
Carelink Summary Report 

## 2016-10-31 ENCOUNTER — Ambulatory Visit: Payer: Commercial Managed Care - HMO | Admitting: Endocrinology

## 2016-11-01 ENCOUNTER — Other Ambulatory Visit: Payer: Self-pay | Admitting: Internal Medicine

## 2016-11-02 ENCOUNTER — Other Ambulatory Visit: Payer: Self-pay | Admitting: Pharmacist

## 2016-11-02 ENCOUNTER — Other Ambulatory Visit: Payer: Self-pay | Admitting: General Practice

## 2016-11-02 MED ORDER — WARFARIN SODIUM 5 MG PO TABS: ORAL_TABLET | ORAL | 3 refills | Status: DC

## 2016-11-02 NOTE — Patient Outreach (Signed)
[facility]) Care Management  11/02/2016  [name] 12-11-1955 [id number]  Patient called into [facility] Main office and requested to speak with [name] regarding medication assistance.    Patient had previously worked with [place] on medication assistance options in 2017.    Successful phone outreach to patient, HIPAA details verified.   Patient verbally consented to speak with [name].    Patient states that he recalls being denied assistance from [name] last year but he states he received assistance from [name] last year for Advair.    Patient states he has partial Extra Help from [name] for his medications.   Discussed with patient [name] patient assistance program approves Part D beneficiaries on a calendar year basis and requires them to spend at least $600 on prescriptions as part the application requirements.    Patient states he has [name] in 2018.  Counseled patient his insurance may have a deductible for new plan year.  Counseled patient with [employer] Extra Help, manufacturer patient assistance options become more limited.   Patient reports he has not spent $600 out-of-pocket yet and was encouraged to contact [name] when he meets the out-of-pocket spend for 2018.   Plan:  Patient provided with [name] Pharmacist contact number and he agrees to contact [name] when he meets out-of-pocket spend to try to apply for [name] patient assistance.   Will not open pharmacy case at this time.    Karrie Meres, PharmD, [place] [phone]

## 2016-11-02 NOTE — Telephone Encounter (Signed)
Routing to cindy---patient sees coumadin clinic for INR checks

## 2016-11-06 ENCOUNTER — Encounter: Payer: Self-pay | Admitting: Endocrinology

## 2016-11-06 ENCOUNTER — Telehealth: Payer: Self-pay | Admitting: Internal Medicine

## 2016-11-06 ENCOUNTER — Ambulatory Visit (INDEPENDENT_AMBULATORY_CARE_PROVIDER_SITE_OTHER): Payer: Medicare HMO | Admitting: Endocrinology

## 2016-11-06 VITALS — BP 110/72 | HR 102 | Ht 70.0 in | Wt 307.0 lb

## 2016-11-06 DIAGNOSIS — E038 Other specified hypothyroidism: Secondary | ICD-10-CM | POA: Diagnosis not present

## 2016-11-06 DIAGNOSIS — E0520 Thyrotoxicosis with toxic multinodular goiter without thyrotoxic crisis or storm: Secondary | ICD-10-CM | POA: Diagnosis not present

## 2016-11-06 NOTE — Telephone Encounter (Signed)
Pt calling to make appt, recall shows [name] for April, pt didn't have surgery as expected after last visit with [name] and just want to make sure he doesn't need to see [name] next-pls call -or let me know and I will call and schedule-

## 2016-11-06 NOTE — Telephone Encounter (Signed)
Would still have him see [name] as a-fib is still the issue.   Thanks!

## 2016-11-06 NOTE — Progress Notes (Signed)
Patient ID: [name], male   DOB: 03-Nov-1955, 61 y.o.   MRN: [id number]                                                                                                               Reason for Appointment:   follow-up for thyroid  Referring physician: [name]   History of Present Illness:   Prior to his consultation the patient  had symptoms of palpitations, shakiness, feeling excessively warm and sweaty, and fatigue.  Because of his taking amiodarone for 3 years he has had periodic monitoring of his thyroid levels and was found to be hyperthyroid in 6/16 His amiodarone was stopped in June 2016 He was started on methimazole and the dose was increased to 20 mg twice a day; his free T4 was high at 2.7 in 8/16 Although he did not come back for follow-up and had no other thyroid labs he continued methimazole on his own until 1/17  Even without methimazole in 2/17 his TSH was significantly higher at 9.2 with a low free T4 of 0.5 He appeared asymptomatic at that time He has been on levothyroxine 25 g subsequently  He had left off his medication a few days prior to his visit in 4/17 and free T4 was low again  He continues to take 25 g of LEVOTHYROXINE now, he is taking this regularly His weight fluctuates based on his fluid status He continues to have fatigability and shortness of breath on exertion with his cardiac problems and also problems with heart rhythm  His thyroid levels are recently showing improved free T4 levels and consistently normal TSH  Wt Readings from Last 3 Encounters:  11/06/16 (!) 307 lb (139.3 kg)  07/13/16 (!) 308 lb 6.4 oz (139.9 kg)  06/20/16 (!) 314 lb 6.4 oz (142.6 kg)    Lab Results  Component Value Date   TSH 1.57 10/29/2016   TSH 1.44 05/30/2016   TSH 1.38 05/30/2016   FREET4 0.78 10/29/2016   FREET4 0.67 05/30/2016   FREET4 0.56 (L) 02/10/2016        Allergies as of 11/06/2016      Reactions   Pravastatin Sodium Other (See Comments)     myalgia   Simvastatin Other (See Comments)   severe leg cramps   Statins Other (See Comments)   myalgia   Amiodarone    hyperthyroidism   Tape Other (See Comments)   Skin Tears Use Paper Tape Only      Medication List       Accurate as of 11/06/16 11:09 AM. Always use your most recent med list.          ADVAIR DISKUS 250-50 MCG/DOSE Aepb Generic drug:  Fluticasone-Salmeterol INHALE 1 PUFF EVERY DAY AS DIRECTED   albuterol 108 (90 Base) MCG/ACT inhaler Commonly known as:  PROVENTIL HFA;VENTOLIN HFA Inhale 2 puffs into the lungs 4 times a day as needed   VENTOLIN HFA 108 (90 Base) MCG/ACT inhaler Generic drug:  albuterol INHALE 2 PUFFS 4 TIMES A  DAY   ALPRAZolam 0.5 MG tablet Commonly known as:  XANAX 1-2 tab by mouth at bedtime as needed for sleep   carisoprodol 350 MG tablet Commonly known as:  SOMA TAKE 1 TABLET BY MOUTH 3 TIMES A DAY AS NEEDED FOR MUSCLE SPASMS   diltiazem 120 MG 24 hr capsule Commonly known as:  CARDIZEM CD Take 1 capsule (120 mg total) by mouth 2 (two) times daily.   diphenhydrAMINE 50 MG capsule Commonly known as:  BENADRYL TAKE ONE CAPSULE BY MOUTH EVERY 6 HOURS AS NEEDED FOR ITCHING   doxepin 25 MG capsule Commonly known as:  SINEQUAN TAKE ONE CAPSULE BY MOUTH TWICE A DAY   fluocinonide cream 0.05 % Commonly known as:  LIDEX Apply 1 application topically 2 (two) times daily.   hydrocortisone 2.5 % cream Apply 1 application topically 2 (two) times daily. itching   hydrocortisone 2.5 % cream APPLY TWICE A DAY   hydrOXYzine 25 MG tablet Commonly known as:  ATARAX/VISTARIL TAKE 1 TABLET BY MOUTH 3 TIMES A DAY FOR ITCHING   ibuprofen 200 MG tablet Commonly known as:  ADVIL,MOTRIN Take 400 mg by mouth 2 (two) times daily.   levothyroxine 25 MCG tablet Commonly known as:  SYNTHROID, LEVOTHROID TAKE 1 TABLET BY MOUTH EVERY DAY BEFORE BREAKFAST   linaclotide 145 MCG Caps capsule Commonly known as:  LINZESS Take 145 mcg by  mouth daily as needed (Crampy pain).   Loratadine 10 MG Caps Take 10 mg by mouth daily. Reported on 02/16/2016   nebivolol 5 MG tablet Commonly known as:  BYSTOLIC Take 5 mg by mouth daily.   BYSTOLIC 5 MG tablet Generic drug:  nebivolol TAKE 1 AND 1/2 TABLETS BY MOUTH EVERY DAY   omeprazole 20 MG capsule Commonly known as:  PRILOSEC Take 1 capsule (20 mg total) by mouth daily with supper.   omeprazole 20 MG capsule Commonly known as:  PRILOSEC TAKE ONE CAPSULE BY MOUTH TWICE A DAY   potassium chloride SA 20 MEQ tablet Commonly known as:  K-DUR,KLOR-CON Take 40 mEq by mouth daily.   sertraline 100 MG tablet Commonly known as:  ZOLOFT TAKE 2 TABLETS BY MOUTH AT BEDTIME   silver sulfADIAZINE 1 % cream Commonly known as:  SILVADENE APPLY TO AFFECTED AREA TOPICALLY EVERY DAY   torsemide 20 MG tablet Commonly known as:  DEMADEX Take 3 tablets (60 mg total) by mouth 2 (two) times daily.   traZODone 100 MG tablet Commonly known as:  DESYREL TAKE 1/2 TABLET BY MOUTH AT BEDTIME AS NEEDED   warfarin 5 MG tablet Commonly known as:  COUMADIN Take as directed by anticoagulation clinic   zolpidem 10 MG tablet Commonly known as:  AMBIEN TAKE 1 TABLET BY MOUTH AT BEDTIME           Past Medical History:  Diagnosis Date  . Anemia    supposed to be taking Vit B but doesn't  . ANXIETY    takes Xanax nightly  . Arthritis   . ASTHMA    Albuterol prn and Advair daily;also takes Prednisone daily  . Atrial fibrillation ([place]) 09/06/2015  . Cardiomyopathy [facility])    a. EF 25% TEE July 2013; b. EF normalized 2015;  c. 03/2015 Echo: EF 40-45%, difrf HK, PASP 38 mmHg, Mild MR, sev LAE/RAE.  [name] Chronic constipation    takes OTC stool softener  . COPD (chronic obstructive pulmonary disease) ([place])   . DEPRESSION    takes Zoloft and Doxepin daily  . Diverticulitis   .  DYSKINESIA, ESOPHAGUS   . Essential hypertension       . FIBROMYALGIA   . GERD (gastroesophageal reflux disease)        . Glaucoma   . HYPERLIPIDEMIA    a. Intolerant to statins.  . INSOMNIA    takes Ambien nightly  . Myocardial infarction    a. 2012 Myoview notable for prior infarct;  b. 03/2015 Lexiscan CL: EF 37%, diff HK, small area of inferior infarct from apex to base-->Med Rx.  . O2 dependent    uses oxygen 2 and 1/2 liters per [place] at hs  . Paroxysmal atrial fibrillation (HCC)    a. CHA2DS2VASc = 3--> takes Coumadin;  b. 03/15/2015 Successful TEE/DCCV;  c. 03/2015 recurrent afib, Amio d/c'd in setting of hyperthyroidism.  . Peripheral neuropathy ([place])   . Rash and other nonspecific skin eruption 04/12/2009   no cause found saw dermatologists x 2 and allergist  . SLEEP APNEA, OBSTRUCTIVE    a. doesn't use CPAP  . Syncope    a. 03/2015 s/p MDT LINQ.  [name] Type II diabetes mellitus ([place])         Past Surgical History:  Procedure Laterality Date  . ACNE CYST REMOVAL     2 on back   . CARDIAC CATHETERIZATION N/A 03/21/2016   Procedure: Right/Left Heart Cath and Coronary Angiography;  Surgeon: Larey Dresser, MD;  Location: [place] CV LAB;  Service: Cardiovascular;  Laterality: N/A;  . CARDIOVERSION  04/18/2012   Procedure: CARDIOVERSION;  Surgeon: Fay Records, MD;  Location: [place];  Service: Cardiovascular;  Laterality: N/A;  . CARDIOVERSION  04/25/2012   Procedure: CARDIOVERSION;  Surgeon: Thayer Headings, MD;  Location: [place];  Service: Cardiovascular;  Laterality: N/A;  . CARDIOVERSION  04/25/2012   Procedure: CARDIOVERSION;  Surgeon: Fay Records, MD;  Location: [place];  Service: Cardiovascular;  Laterality: N/A;  . CARDIOVERSION  05/09/2012   Procedure: CARDIOVERSION;  Surgeon: Sherren Mocha, MD;  Location: [place];  Service: Cardiovascular;  Laterality: N/A;  changed from crenshaw to cooper by trish/leone-endo  . CARDIOVERSION N/A 03/15/2015   Procedure: CARDIOVERSION;  Surgeon: Thayer Headings, MD;  Location: [facility] ENDOSCOPY;  Service: Cardiovascular;  Laterality: N/A;  . COLONOSCOPY    .  COLONOSCOPY WITH PROPOFOL N/A 10/21/2014   Procedure: COLONOSCOPY WITH PROPOFOL;  Surgeon: Ladene Artist, MD;  Location: [facility] ENDOSCOPY;  Service: Endoscopy;  Laterality: N/A;  . EP IMPLANTABLE DEVICE N/A 04/06/2015   Procedure: Loop Recorder Insertion;  Surgeon: Evans Lance, MD;  Location: [place] CV LAB;  Service: Cardiovascular;  Laterality: N/A;  . ESOPHAGOGASTRODUODENOSCOPY    . TEE WITHOUT CARDIOVERSION  04/25/2012   Procedure: TRANSESOPHAGEAL ECHOCARDIOGRAM (TEE);  Surgeon: Thayer Headings, MD;  Location: [place];  Service: Cardiovascular;  Laterality: N/A;  . TEE WITHOUT CARDIOVERSION N/A 03/15/2015   Procedure: TRANSESOPHAGEAL ECHOCARDIOGRAM (TEE);  Surgeon: Thayer Headings, MD;  Location: [place];  Service: Cardiovascular;  Laterality: N/A;  . TONSILLECTOMY    . TOTAL KNEE ARTHROPLASTY Right 06/15/2014   Procedure: TOTAL KNEE ARTHROPLASTY;  Surgeon: Renette Butters, MD;  Location: [place];  Service: Orthopedics;  Laterality: Right;    Family History  Problem Relation Age of Onset  . COPD Mother   . Asthma Mother   . Colon polyps Mother   . Allergies Mother   . Hypothyroidism Mother   . Asthma Maternal Grandmother   . Colon cancer Neg Hx     Social History:  reports that he quit smoking about 9 years ago. His smoking use included Cigarettes. He has a 60.00 pack-year smoking history. He has never used smokeless tobacco. He reports that he does not drink alcohol or use drugs.  Allergies:  Allergies  Allergen Reactions  . Pravastatin Sodium Other (See Comments)    myalgia  . Simvastatin Other (See Comments)    severe leg cramps  . Statins Other (See Comments)    myalgia  . Amiodarone     hyperthyroidism  . Tape Other (See Comments)    Skin Tears Use Paper Tape Only    Review of Systems:  ROS    He had been diagnosed to have mild diabetes but blood sugars have been controlled, Followed by PCP    Lab Results  Component Value Date   HGBA1C 5.9 05/30/2016      Examination:   BP 110/72   Pulse (!) 102   Ht 5\' 10"  (1.778 m)   Wt (!) 307 lb (139.3 kg)   SpO2 96%   BMI 44.05 kg/m   Thyroid is just palpable on swallowing, firm,Right lobe is about twice normal and left lobe is 1-1/2 times normal  His reflexes at biceps  appear normal    Assessment/Plan:  Mild persistent  secondary hypothyroidism with low free T4 level at baseline  He has not had any significant symptoms although his  symptoms are more related to his cardiac problems and other general medical issues  Currently with 25 g levothyroxine his free T4 is normal, TSH normal again  Will  continue the same dose for another 6 months  Multinodular goiter on exam, small and stable, No local pressure symptoms       Braylinn Gulden 11/06/2016, 11:09 AM

## 2016-11-12 DIAGNOSIS — G4733 Obstructive sleep apnea (adult) (pediatric): Secondary | ICD-10-CM | POA: Diagnosis not present

## 2016-11-15 ENCOUNTER — Encounter (HOSPITAL_COMMUNITY): Payer: Self-pay | Admitting: Nurse Practitioner

## 2016-11-15 ENCOUNTER — Ambulatory Visit (HOSPITAL_COMMUNITY)
Admission: RE | Admit: 2016-11-15 | Discharge: 2016-11-15 | Disposition: A | Discharge status: Home / Self Care | Payer: Medicare HMO | Source: Ambulatory Visit | Attending: Nurse Practitioner | Admitting: Nurse Practitioner

## 2016-11-15 VITALS — BP 118/76 | HR 77 | Ht 70.0 in | Wt 311.2 lb

## 2016-11-15 DIAGNOSIS — I5042 Chronic combined systolic (congestive) and diastolic (congestive) heart failure: Secondary | ICD-10-CM | POA: Insufficient documentation

## 2016-11-15 DIAGNOSIS — I480 Paroxysmal atrial fibrillation: Secondary | ICD-10-CM | POA: Diagnosis not present

## 2016-11-15 DIAGNOSIS — Z7901 Long term (current) use of anticoagulants: Secondary | ICD-10-CM | POA: Diagnosis not present

## 2016-11-15 DIAGNOSIS — G4730 Sleep apnea, unspecified: Secondary | ICD-10-CM | POA: Diagnosis not present

## 2016-11-15 DIAGNOSIS — I5022 Chronic systolic (congestive) heart failure: Secondary | ICD-10-CM | POA: Diagnosis not present

## 2016-11-15 NOTE — Progress Notes (Signed)
Patient ID: [name], male   DOB: 1956/04/10, 61 y.o.   MRN: [id number]     Primary Care Physician: [name], MD Referring Physician: Dr. Derald Macleod is a 61 y.o. male with a h/o history of paroxysmal atrial fibrillation and has been managed with amiodarone and Coumadin therapy. He also has a history of chronic combined systolic and diastolic heart failure in the setting of morbid obesity and sleep apnea. Most recent EF was 40-45% earlier this month. In May, he was found to have a suppressed TSH of 0.01.This was felt to be most likely secondary to amiodarone therapy. In that setting, he developed recurrent afib and was seen in late May by Dr. [name] @ which time his amio was increased to 200 mg daily in an effort to increase his amio level and get him to TEE and cardioversion. He underwent TEE/DCCV on 5/31, which was initially successfuly, but converted back to atrial fibrillation within 1 day. He was subsequently seen in atrial fibrillation clinic on 6/8 and was noted to be in afib with rates into the 180's with activity. His BB was switched from metoprolol to bystolic, dilt was added, and digoxin was d/c'd. It was hoped that he could undergo repeat DCCV however on 6/15, he experienced a syncopal episode while walking from his car. He presented to the [facility] ED. There, he was noted to be volume overloaded. He was admitted for diuresis (diuresed ~ 25 lbs). Echo showed EF of 40-45%, which was down from recent TEE. He was seen by EP and given amio induced thyrotoxicosis, his amio was d/c'd. A myoview was undertaken during hospitalization and revealed prior inferior infarct w/o active ischemia. In setting of syncope, EP recommended ILR, and a Linq event recorder was placed prior to discharge.   He returns to afib clinic 2/1 for f/u of Dr. [name] and is in chronic afib, rate controlled. He is doing better with stable fluid status. He has good days and bad days. Ambulation is limited  due to obesity and joint issues. Fluid status is stable. Pt states that Dr. [name] recommended gastric bypass surgery but the surgeon found him to be too high of a risk.  Today, he denies symptoms of palpitations, chest pain, shortness of breath, orthopnea, PND,dizziness, presyncope, syncope, or neurologic sequela.  Chronic shortness of breath with exertion and LLE. The patient is tolerating medications without difficulties and is otherwise without complaint today.   Past Medical History:  Diagnosis Date  . Anemia    supposed to be taking Vit B but doesn't  . ANXIETY    takes Xanax nightly  . Arthritis   . ASTHMA    Albuterol prn and Advair daily;also takes Prednisone daily  . Atrial fibrillation ([place]) 09/06/2015  . Cardiomyopathy [facility])    a. EF 25% TEE July 2013; b. EF normalized 2015;  c. 03/2015 Echo: EF 40-45%, difrf HK, PASP 38 mmHg, Mild MR, sev LAE/RAE.  [name] Chronic constipation    takes OTC stool softener  . COPD (chronic obstructive pulmonary disease) ([place])   . DEPRESSION    takes Zoloft and Doxepin daily  . Diverticulitis   . DYSKINESIA, ESOPHAGUS   . Essential hypertension       . FIBROMYALGIA   . GERD (gastroesophageal reflux disease)       . Glaucoma   . HYPERLIPIDEMIA    a. Intolerant to statins.  . INSOMNIA    takes Ambien nightly  . Myocardial infarction  a. 2012 Myoview notable for prior infarct;  b. 03/2015 Lexiscan CL: EF 37%, diff HK, small area of inferior infarct from apex to base-->Med Rx.  . O2 dependent    uses oxygen 2 and 1/2 liters per [place] at hs  . Paroxysmal atrial fibrillation (HCC)    a. CHA2DS2VASc = 3--> takes Coumadin;  b. 03/15/2015 Successful TEE/DCCV;  c. 03/2015 recurrent afib, Amio d/c'd in setting of hyperthyroidism.  . Peripheral neuropathy ([place])   . Rash and other nonspecific skin eruption 04/12/2009   no cause found saw dermatologists x 2 and allergist  . SLEEP APNEA, OBSTRUCTIVE    a. doesn't use CPAP  . Syncope    a. 03/2015 s/p MDT  LINQ.  [name] Type II diabetes mellitus ([place])        Past Surgical History:  Procedure Laterality Date  . ACNE CYST REMOVAL     2 on back   . CARDIAC CATHETERIZATION N/A 03/21/2016   Procedure: Right/Left Heart Cath and Coronary Angiography;  Surgeon: Larey Dresser, MD;  Location: [place] CV LAB;  Service: Cardiovascular;  Laterality: N/A;  . CARDIOVERSION  04/18/2012   Procedure: CARDIOVERSION;  Surgeon: Fay Records, MD;  Location: [place];  Service: Cardiovascular;  Laterality: N/A;  . CARDIOVERSION  04/25/2012   Procedure: CARDIOVERSION;  Surgeon: Thayer Headings, MD;  Location: [place];  Service: Cardiovascular;  Laterality: N/A;  . CARDIOVERSION  04/25/2012   Procedure: CARDIOVERSION;  Surgeon: Fay Records, MD;  Location: [place];  Service: Cardiovascular;  Laterality: N/A;  . CARDIOVERSION  05/09/2012   Procedure: CARDIOVERSION;  Surgeon: Sherren Mocha, MD;  Location: [place];  Service: Cardiovascular;  Laterality: N/A;  changed from crenshaw to cooper by trish/leone-endo  . CARDIOVERSION N/A 03/15/2015   Procedure: CARDIOVERSION;  Surgeon: Thayer Headings, MD;  Location: [facility] ENDOSCOPY;  Service: Cardiovascular;  Laterality: N/A;  . COLONOSCOPY    . COLONOSCOPY WITH PROPOFOL N/A 10/21/2014   Procedure: COLONOSCOPY WITH PROPOFOL;  Surgeon: Ladene Artist, MD;  Location: [facility] ENDOSCOPY;  Service: Endoscopy;  Laterality: N/A;  . EP IMPLANTABLE DEVICE N/A 04/06/2015   Procedure: Loop Recorder Insertion;  Surgeon: Evans Lance, MD;  Location: [place] CV LAB;  Service: Cardiovascular;  Laterality: N/A;  . ESOPHAGOGASTRODUODENOSCOPY    . TEE WITHOUT CARDIOVERSION  04/25/2012   Procedure: TRANSESOPHAGEAL ECHOCARDIOGRAM (TEE);  Surgeon: Thayer Headings, MD;  Location: [place];  Service: Cardiovascular;  Laterality: N/A;  . TEE WITHOUT CARDIOVERSION N/A 03/15/2015   Procedure: TRANSESOPHAGEAL ECHOCARDIOGRAM (TEE);  Surgeon: Thayer Headings, MD;  Location: [place];  Service:  Cardiovascular;  Laterality: N/A;  . TONSILLECTOMY    . TOTAL KNEE ARTHROPLASTY Right 06/15/2014   Procedure: TOTAL KNEE ARTHROPLASTY;  Surgeon: Renette Butters, MD;  Location: [place];  Service: Orthopedics;  Laterality: Right;    Current Outpatient Prescriptions  Medication Sig Dispense Refill  . ADVAIR DISKUS 250-50 MCG/DOSE AEPB INHALE 1 PUFF EVERY DAY AS DIRECTED 60 each 3  . albuterol (PROVENTIL HFA;VENTOLIN HFA) 108 (90 BASE) MCG/ACT inhaler Inhale 2 puffs into the lungs 4 times a day as needed    . ALPRAZolam (XANAX) 0.5 MG tablet 1-2 tab by mouth at bedtime as needed for sleep 180 tablet 1  . BYSTOLIC 5 MG tablet TAKE 1 AND 1/2 TABLETS BY MOUTH EVERY DAY 45 tablet 2  . carisoprodol (SOMA) 350 MG tablet TAKE 1 TABLET BY MOUTH 3 TIMES A DAY AS NEEDED FOR MUSCLE SPASMS 90 tablet  5  . diltiazem (CARDIZEM CD) 120 MG 24 hr capsule Take 1 capsule (120 mg total) by mouth 2 (two) times daily. 180 capsule 2  . doxepin (SINEQUAN) 25 MG capsule TAKE ONE CAPSULE BY MOUTH TWICE A DAY 180 capsule 1  . fluocinonide cream (LIDEX) [id number] % Apply 1 application topically 2 (two) times daily. 30 g 0  . hydrocortisone 2.5 % cream Apply 1 application topically 2 (two) times daily. itching    . hydrocortisone 2.5 % cream APPLY TWICE A DAY 28.35 g 5  . ibuprofen (ADVIL,MOTRIN) 200 MG tablet Take 400 mg by mouth 2 (two) times daily.    [name] levothyroxine (SYNTHROID, LEVOTHROID) 25 MCG tablet TAKE 1 TABLET BY MOUTH EVERY DAY BEFORE BREAKFAST 90 tablet 2  . Linaclotide (LINZESS) 145 MCG CAPS capsule Take 145 mcg by mouth daily as needed (Crampy pain).     [name] omeprazole (PRILOSEC) 20 MG capsule Take 1 capsule (20 mg total) by mouth daily with supper. 90 capsule 1  . potassium chloride SA (K-DUR,KLOR-CON) 20 MEQ tablet Take 40 mEq by mouth daily.    . sertraline (ZOLOFT) 100 MG tablet TAKE 2 TABLETS BY MOUTH AT BEDTIME 180 tablet 3  . silver sulfADIAZINE (SILVADENE) 1 % cream APPLY TO AFFECTED AREA TOPICALLY EVERY DAY 50  g 0  . torsemide (DEMADEX) 20 MG tablet Take 3 tablets (60 mg total) by mouth 2 (two) times daily. 180 tablet 3  . traZODone (DESYREL) 100 MG tablet TAKE 1/2 TABLET BY MOUTH AT BEDTIME AS NEEDED 45 tablet 1  . VENTOLIN HFA 108 (90 Base) MCG/ACT inhaler INHALE 2 PUFFS 4 TIMES A DAY 18 Inhaler 0  . warfarin (COUMADIN) 5 MG tablet Take as directed by anticoagulation clinic 30 tablet 3  . zolpidem (AMBIEN) 10 MG tablet TAKE 1 TABLET BY MOUTH AT BEDTIME 30 tablet 5   No current facility-administered medications for this encounter.     Allergies  Allergen Reactions  . Pravastatin Sodium Other (See Comments)    myalgia  . Simvastatin Other (See Comments)    severe leg cramps  . Statins Other (See Comments)    myalgia  . Amiodarone     hyperthyroidism  . Tape Other (See Comments)    Skin Tears Use Paper Tape Only    Social History   Social History  . Marital status: Divorced    Spouse name: N/A  . Number of children: 2  . Years of education: N/A   Occupational History  . retired/disabled. prev worked in Therapist, sports. Disabled   Social History Main Topics  . Smoking status: Former Smoker    Packs/day: 2.00    Years: 30.00    Types: Cigarettes    Quit date: 10/16/2007  . Smokeless tobacco: Never Used  . Alcohol use No  . Drug use: No  . Sexual activity: Not on file   Other Topics Concern  . Not on file   Social History Narrative   Lives alone.    Family History  Problem Relation Age of Onset  . COPD Mother   . Asthma Mother   . Colon polyps Mother   . Allergies Mother   . Hypothyroidism Mother   . Asthma Maternal Grandmother   . Colon cancer Neg Hx     ROS- All systems are reviewed and negative except as per the HPI above  Physical Exam: Vitals:   11/15/16 1435  BP: 118/76  Pulse: 77  Weight: (!) 311 lb 3.2 oz (141.2 kg)  Height: 5\' 10"  (1.778 m)    GEN- The patient is well appearing, alert and oriented x 3 today.   Head- normocephalic,  atraumatic Eyes-  Sclera clear, conjunctiva pink Ears- hearing intact Oropharynx- clear Neck- supple, no JVP Lymph- no cervical lymphadenopathy Lungs- Clear to ausculation bilaterally, normal work of breathing Heart- Irregular rate and rhythm, no murmurs, rubs or gallops, PMI not laterally displaced GI- soft, NT, ND, + BS Extremities- no clubbing, cyanosis, or edema MS- no significant deformity or atrophy Skin- no rash or lesion Psych- euthymic mood, full affect Neuro- strength and sensation are intact  EKG- Atrial fibrillation at 77 bpm, bifascicular block Epic records reviewed   Assessment and Plan: 1. Persistent afib V rates are controlled No change in therapy  Continue warfarin for chadsvasc score of at least 3.  2. Chronic combined sys/diatolic CHF Volume status and weight stable  Continue diuretic  3. Syncope Has linq, no further episodes  F/u with Dr. [name] in 6 months  Geroge Baseman. Sophi Calligan, [facility] [address] [place],  [id number] [phone]

## 2016-11-18 LAB — CUP PACEART REMOTE DEVICE CHECK
Implantable Pulse Generator Implant Date: 20160622
MDC IDC SESS DTM: 20171214190656

## 2016-11-18 NOTE — [place] Notes (Signed)
Carelink summary report received. Battery status OK. Normal device function. No new symptom episodes, brady, or pause episodes. 2 tachy- 1 w/ ECG appears AF w/ RVR. 57 AF 99.6% +warfarin. Monthly summary reports and ROV/PRN

## 2016-11-23 ENCOUNTER — Ambulatory Visit (INDEPENDENT_AMBULATORY_CARE_PROVIDER_SITE_OTHER): Payer: Medicare HMO | Admitting: General Practice

## 2016-11-23 DIAGNOSIS — Z5181 Encounter for therapeutic drug level monitoring: Secondary | ICD-10-CM

## 2016-11-23 LAB — POCT INR: INR: 3

## 2016-11-23 NOTE — Patient Instructions (Signed)
Pre visit review using our clinic review tool, if applicable. No additional management support is needed unless otherwise documented below in the visit note. 

## 2016-11-23 NOTE — Progress Notes (Signed)
I have reviewed and agree with the plan. 

## 2016-11-26 ENCOUNTER — Ambulatory Visit (INDEPENDENT_AMBULATORY_CARE_PROVIDER_SITE_OTHER): Payer: Medicare HMO | Admitting: *Deleted

## 2016-11-26 DIAGNOSIS — R55 Syncope and collapse: Secondary | ICD-10-CM

## 2016-11-26 LAB — CUP PACEART REMOTE DEVICE CHECK
Date Time Interrogation Session: 20180212193932
MDC IDC PG IMPLANT DT: 20160622

## 2016-11-27 NOTE — Progress Notes (Signed)
Carelink Summary Report / Loop Recorder 

## 2016-11-29 ENCOUNTER — Other Ambulatory Visit (INDEPENDENT_AMBULATORY_CARE_PROVIDER_SITE_OTHER): Payer: Medicare HMO

## 2016-11-29 ENCOUNTER — Other Ambulatory Visit: Payer: Self-pay | Admitting: Internal Medicine

## 2016-11-29 DIAGNOSIS — E119 Type 2 diabetes mellitus without complications: Secondary | ICD-10-CM

## 2016-11-29 DIAGNOSIS — Z0001 Encounter for general adult medical examination with abnormal findings: Secondary | ICD-10-CM

## 2016-11-29 LAB — URINALYSIS, ROUTINE W REFLEX MICROSCOPIC
BILIRUBIN URINE: NEGATIVE
KETONES UR: NEGATIVE
LEUKOCYTES UA: NEGATIVE
NITRITE: NEGATIVE
PH: 6 (ref 5.0–8.0)
SPECIFIC GRAVITY, URINE: 1.02 (ref 1.000–1.030)
URINE GLUCOSE: NEGATIVE
Urobilinogen, UA: 0.2 (ref 0.0–1.0)

## 2016-11-29 LAB — HEPATIC FUNCTION PANEL
ALT: 24 U/L (ref 0–53)
AST: 24 U/L (ref 0–37)
Albumin: 4.2 g/dL (ref 3.5–5.2)
Alkaline Phosphatase: 72 U/L (ref 39–117)
BILIRUBIN DIRECT: 0.1 mg/dL (ref 0.0–0.3)
BILIRUBIN TOTAL: 0.6 mg/dL (ref 0.2–1.2)
Total Protein: 7 g/dL (ref 6.0–8.3)

## 2016-11-29 LAB — HEMOGLOBIN A1C: Hgb A1c MFr Bld: 6 % (ref 4.6–6.5)

## 2016-11-29 LAB — BASIC METABOLIC PANEL
BUN: 11 mg/dL (ref 6–23)
CHLORIDE: 106 meq/L (ref 96–112)
CO2: 28 mEq/L (ref 19–32)
CREATININE: 0.78 mg/dL (ref 0.40–1.50)
Calcium: 9 mg/dL (ref 8.4–10.5)
GFR: 107.21 mL/min (ref >60.00)
Glucose, Bld: 93 mg/dL (ref 70–99)
POTASSIUM: 3.8 meq/L (ref 3.5–5.1)
Sodium: 139 mEq/L (ref 135–145)

## 2016-11-29 LAB — LIPID PANEL
CHOL/HDL RATIO: 5
Cholesterol: 174 mg/dL (ref 0–200)
HDL: 38.1 mg/dL — ABNORMAL LOW (ref >39.00)
LDL CALC: 115 mg/dL — AB (ref 0–99)
NONHDL: 135.47
Triglycerides: 100 mg/dL (ref 0.0–149.0)
VLDL: 20 mg/dL (ref 0.0–40.0)

## 2016-11-29 LAB — TSH: TSH: 1.48 u[IU]/mL (ref 0.35–4.50)

## 2016-12-04 ENCOUNTER — Encounter: Payer: Self-pay | Admitting: Internal Medicine

## 2016-12-04 ENCOUNTER — Other Ambulatory Visit (INDEPENDENT_AMBULATORY_CARE_PROVIDER_SITE_OTHER): Payer: Medicare HMO

## 2016-12-04 ENCOUNTER — Ambulatory Visit (INDEPENDENT_AMBULATORY_CARE_PROVIDER_SITE_OTHER): Payer: Medicare HMO | Admitting: Internal Medicine

## 2016-12-04 VITALS — BP 110/72 | HR 78 | Temp 98.6°F | Ht 70.0 in | Wt 302.0 lb

## 2016-12-04 DIAGNOSIS — L308 Other specified dermatitis: Secondary | ICD-10-CM

## 2016-12-04 DIAGNOSIS — Z0001 Encounter for general adult medical examination with abnormal findings: Secondary | ICD-10-CM

## 2016-12-04 DIAGNOSIS — Z0000 Encounter for general adult medical examination without abnormal findings: Secondary | ICD-10-CM

## 2016-12-04 DIAGNOSIS — R3129 Other microscopic hematuria: Secondary | ICD-10-CM | POA: Diagnosis not present

## 2016-12-04 DIAGNOSIS — E119 Type 2 diabetes mellitus without complications: Secondary | ICD-10-CM | POA: Diagnosis not present

## 2016-12-04 LAB — BASIC METABOLIC PANEL
BUN: 13 mg/dL (ref 6–23)
CALCIUM: 8.8 mg/dL (ref 8.4–10.5)
CO2: 32 mEq/L (ref 19–32)
Chloride: 101 mEq/L (ref 96–112)
Creatinine, Ser: 0.97 mg/dL (ref 0.40–1.50)
GFR: 83.36 mL/min (ref >60.00)
GLUCOSE: 88 mg/dL (ref 70–99)
Potassium: 3.8 mEq/L (ref 3.5–5.1)
SODIUM: 139 meq/L (ref 135–145)

## 2016-12-04 LAB — HEPATIC FUNCTION PANEL
ALBUMIN: 4.4 g/dL (ref 3.5–5.2)
ALT: 19 U/L (ref 0–53)
AST: 21 U/L (ref 0–37)
Alkaline Phosphatase: 68 U/L (ref 39–117)
Bilirubin, Direct: 0.1 mg/dL (ref 0.0–0.3)
Total Bilirubin: 0.5 mg/dL (ref 0.2–1.2)
Total Protein: 7.1 g/dL (ref 6.0–8.3)

## 2016-12-04 LAB — LIPID PANEL
CHOLESTEROL: 164 mg/dL (ref 0–200)
HDL: 35.2 mg/dL — ABNORMAL LOW (ref >39.00)
LDL CALC: 102 mg/dL — AB (ref 0–99)
NonHDL: 128.6
TRIGLYCERIDES: 134 mg/dL (ref 0.0–149.0)
Total CHOL/HDL Ratio: 5
VLDL: 26.8 mg/dL (ref 0.0–40.0)

## 2016-12-04 LAB — HEMOGLOBIN A1C: Hgb A1c MFr Bld: 6 % (ref 4.6–6.5)

## 2016-12-04 MED ORDER — LIDOCAINE 5 % EX PTCH: MEDICATED_PATCH | CUTANEOUS | 1 refills | Status: DC

## 2016-12-04 NOTE — Patient Instructions (Signed)
Please take all new medication as prescribed - the lidoderm patch as needed for skin pain and itching  Also OK to try the topical OTC Benadryl cream as needed  Please continue all other medications as before, and refills have been done if requested.  Please have the pharmacy call with any other refills you may need.  Please continue your efforts at being more active, low cholesterol diet, and weight control.  You are otherwise up to date with prevention measures today.  Please keep your appointments with your specialists as you may have planned  You will be contacted regarding the referral for: Urology  Please return in 6 months, or sooner if needed, with Lab testing done 3-5 days before

## 2016-12-04 NOTE — Progress Notes (Addendum)
Subjective:    Patient ID: [name], male    DOB: June 20, 1956, 61 y.o.   MRN: [id number]  HPI  Here for wellness and f/u;  Overall doing ok;  Pt denies Chest pain, worsening SOB, DOE, wheezing, orthopnea, PND, worsening LE edema, palpitations, dizziness or syncope.  Pt denies neurological change such as new headache, facial or extremity weakness.  Pt denies polydipsia, polyuria, or low sugar symptoms. Pt states overall good compliance with treatment and medications, good tolerability, and has been trying to follow appropriate diet.  Pt denies worsening depressive symptoms, suicidal ideation or panic. No fever, night sweats, wt loss, loss of appetite, or other constitutional symptoms.  Pt states good ability with ADL's, has low fall risk, home safety reviewed and adequate, no other significant changes in hearing or vision, and only occasionally active with exercise.  Denies urinary symptoms such as dysuria, frequency, urgency, flank pain, hematuria or n/v, fever, chills.  Asks for steroid tx for non healing spongiotic dermatitis, as can no longer afford to see dermatology.  Cannot recall specific prior tx Past Medical History:  Diagnosis Date  . Anemia    supposed to be taking Vit B but doesn't  . ANXIETY    takes Xanax nightly  . Arthritis   . ASTHMA    Albuterol prn and Advair daily;also takes Prednisone daily  . Atrial fibrillation ([place]) 09/06/2015  . Cardiomyopathy [facility])    a. EF 25% TEE July 2013; b. EF normalized 2015;  c. 03/2015 Echo: EF 40-45%, difrf HK, PASP 38 mmHg, Mild MR, sev LAE/RAE.  [name] Chronic constipation    takes OTC stool softener  . COPD (chronic obstructive pulmonary disease) ([place])   . DEPRESSION    takes Zoloft and Doxepin daily  . Diverticulitis   . DYSKINESIA, ESOPHAGUS   . Essential hypertension       . FIBROMYALGIA   . GERD (gastroesophageal reflux disease)       . Glaucoma   . HYPERLIPIDEMIA    a. Intolerant to statins.  . INSOMNIA    takes Ambien  nightly  . Myocardial infarction    a. 2012 Myoview notable for prior infarct;  b. 03/2015 Lexiscan CL: EF 37%, diff HK, small area of inferior infarct from apex to base-->Med Rx.  . O2 dependent    uses oxygen 2 and 1/2 liters per [place] at hs  . Paroxysmal atrial fibrillation ([facility])    a. CHA2DS2VASc = 3--> takes Coumadin;  b. 03/15/2015 Successful TEE/DCCV;  c. 03/2015 recurrent afib, Amio d/c'd in setting of hyperthyroidism.  . Peripheral neuropathy ([place])   . Rash and other nonspecific skin eruption 04/12/2009   no cause found saw dermatologists x 2 and allergist  . SLEEP APNEA, OBSTRUCTIVE    a. doesn't use CPAP  . Syncope    a. 03/2015 s/p MDT LINQ.  [name] Type II diabetes mellitus ([place])        Past Surgical History:  Procedure Laterality Date  . ACNE CYST REMOVAL     2 on back   . CARDIAC CATHETERIZATION N/A 03/21/2016   Procedure: Right/Left Heart Cath and Coronary Angiography;  Surgeon: Larey Dresser, MD;  Location: [place] [facility];  Service: Cardiovascular;  Laterality: N/A;  . CARDIOVERSION  04/18/2012   Procedure: CARDIOVERSION;  Surgeon: Fay Records, MD;  Location: [place];  Service: Cardiovascular;  Laterality: N/A;  . CARDIOVERSION  04/25/2012   Procedure: CARDIOVERSION;  Surgeon: Thayer Headings, MD;  Location: [place];  Service: Cardiovascular;  Laterality: N/A;  . CARDIOVERSION  04/25/2012   Procedure: CARDIOVERSION;  Surgeon: Fay Records, MD;  Location: [place];  Service: Cardiovascular;  Laterality: N/A;  . CARDIOVERSION  05/09/2012   Procedure: CARDIOVERSION;  Surgeon: Sherren Mocha, MD;  Location: [place];  Service: Cardiovascular;  Laterality: N/A;  changed from crenshaw to cooper by trish/leone-endo  . CARDIOVERSION N/A 03/15/2015   Procedure: CARDIOVERSION;  Surgeon: Thayer Headings, MD;  Location: [facility] ENDOSCOPY;  Service: Cardiovascular;  Laterality: N/A;  . COLONOSCOPY    . COLONOSCOPY WITH PROPOFOL N/A 10/21/2014   Procedure: COLONOSCOPY WITH PROPOFOL;  Surgeon: Ladene Artist, MD;  Location: [facility] ENDOSCOPY;  Service: Endoscopy;  Laterality: N/A;  . EP IMPLANTABLE DEVICE N/A 04/06/2015   Procedure: Loop Recorder Insertion;  Surgeon: Evans Lance, MD;  Location: [place] [facility];  Service: Cardiovascular;  Laterality: N/A;  . ESOPHAGOGASTRODUODENOSCOPY    . TEE WITHOUT CARDIOVERSION  04/25/2012   Procedure: TRANSESOPHAGEAL ECHOCARDIOGRAM (TEE);  Surgeon: Thayer Headings, MD;  Location: [place];  Service: Cardiovascular;  Laterality: N/A;  . TEE WITHOUT CARDIOVERSION N/A 03/15/2015   Procedure: TRANSESOPHAGEAL ECHOCARDIOGRAM (TEE);  Surgeon: Thayer Headings, MD;  Location: [place];  Service: Cardiovascular;  Laterality: N/A;  . TONSILLECTOMY    . TOTAL KNEE ARTHROPLASTY Right 06/15/2014   Procedure: TOTAL KNEE ARTHROPLASTY;  Surgeon: Renette Butters, MD;  Location: [place];  Service: Orthopedics;  Laterality: Right;    reports that he quit smoking about 9 years ago. His smoking use included Cigarettes. He has a 60.00 pack-year smoking history. He has never used smokeless tobacco. He reports that he does not drink alcohol or use drugs. family history includes Allergies in his mother; Asthma in his maternal grandmother and mother; COPD in his mother; Colon polyps in his mother; Hypothyroidism in his mother. Allergies  Allergen Reactions  . Pravastatin Sodium Other (See Comments)    myalgia  . Simvastatin Other (See Comments)    severe leg cramps  . Statins Other (See Comments)    myalgia  . Amiodarone     hyperthyroidism  . Tape Other (See Comments)    Skin Tears Use Paper Tape Only   Current Outpatient Prescriptions on File Prior to Visit  Medication Sig Dispense Refill  . ADVAIR DISKUS 250-50 MCG/DOSE AEPB INHALE 1 PUFF EVERY DAY AS DIRECTED 60 each 3  . albuterol (PROVENTIL HFA;VENTOLIN HFA) 108 (90 BASE) MCG/ACT inhaler Inhale 2 puffs into the lungs 4 times a day as needed    . ALPRAZolam (XANAX) 0.5 MG tablet 1-2 tab by mouth at bedtime as  needed for sleep 180 tablet 1  . BYSTOLIC 5 MG tablet TAKE 1 AND 1/2 TABLETS BY MOUTH EVERY DAY 45 tablet 2  . carisoprodol (SOMA) 350 MG tablet TAKE 1 TABLET BY MOUTH 3 TIMES A DAY AS NEEDED FOR MUSCLE SPASMS 90 tablet 5  . diltiazem (CARDIZEM CD) 120 MG 24 hr capsule Take 1 capsule (120 mg total) by mouth 2 (two) times daily. 180 capsule 2  . doxepin (SINEQUAN) 25 MG capsule TAKE ONE CAPSULE BY MOUTH TWICE A DAY 180 capsule 1  . fluocinonide cream (LIDEX) [id number] % Apply 1 application topically 2 (two) times daily. 30 g 0  . hydrocortisone 2.5 % cream Apply 1 application topically 2 (two) times daily. itching    . hydrocortisone 2.5 % cream APPLY TWICE A DAY 28.35 g 5  . ibuprofen (ADVIL,MOTRIN) 200 MG tablet Take 400 mg by  mouth 2 (two) times daily.    [name] levothyroxine (SYNTHROID, LEVOTHROID) 25 MCG tablet TAKE 1 TABLET BY MOUTH EVERY DAY BEFORE BREAKFAST 90 tablet 2  . Linaclotide (LINZESS) 145 MCG CAPS capsule Take 145 mcg by mouth daily as needed (Crampy pain).     [name] omeprazole (PRILOSEC) 20 MG capsule Take 1 capsule (20 mg total) by mouth daily with supper. 90 capsule 1  . potassium chloride SA (K-DUR,KLOR-CON) 20 MEQ tablet Take 40 mEq by mouth daily.    . sertraline (ZOLOFT) 100 MG tablet TAKE 2 TABLETS BY MOUTH AT BEDTIME 180 tablet 3  . silver sulfADIAZINE (SILVADENE) 1 % cream APPLY TO AFFECTED AREA TOPICALLY EVERY DAY 50 g 0  . torsemide (DEMADEX) 20 MG tablet Take 3 tablets (60 mg total) by mouth 2 (two) times daily. 180 tablet 3  . traZODone (DESYREL) 100 MG tablet TAKE 1/2 TABLET BY MOUTH AT BEDTIME AS NEEDED 45 tablet 1  . VENTOLIN HFA 108 (90 Base) MCG/ACT inhaler INHALE 2 PUFFS 4 TIMES A DAY 18 Inhaler 0  . warfarin (COUMADIN) 5 MG tablet Take as directed by anticoagulation clinic 30 tablet 3  . zolpidem (AMBIEN) 10 MG tablet TAKE 1 TABLET BY MOUTH AT BEDTIME 30 tablet 5  . [DISCONTINUED] atorvastatin (LIPITOR) 20 MG tablet Take 1 tablet (20 mg total) by mouth daily. 30 tablet 11    . [DISCONTINUED] gabapentin (NEURONTIN) 300 MG capsule Take 1 capsule (300 mg total) by mouth 3 (three) times daily. 90 capsule 5   No current facility-administered medications on file prior to visit.    Review of Systems Constitutional: Negative for increased diaphoresis, or other activity, appetite or siginficant weight change other than noted HENT: Negative for worsening hearing loss, ear pain, facial swelling, mouth sores and neck stiffness.   Eyes: Negative for other worsening pain, redness or visual disturbance.  Respiratory: Negative for choking or stridor Cardiovascular: Negative for other chest pain and palpitations.  Gastrointestinal: Negative for worsening diarrhea, blood in stool, or abdominal distention Genitourinary: Negative for hematuria, flank pain or change in urine volume.  Musculoskeletal: Negative for myalgias or other joint complaints.  Skin: Negative for other color change and wound or drainage.  Neurological: Negative for syncope and numbness. other than noted Hematological: Negative for adenopathy. or other swelling Psychiatric/Behavioral: Negative for hallucinations, SI, self-injury, decreased concentration or other worsening agitation.  All other system neg per pt    Objective:   Physical Exam BP 110/72   Pulse 78   Temp 98.6 F (37 C)   Ht 5\' 10"  (1.778 m)   Wt (!) 302 lb (137 kg)   SpO2 98%   BMI 43.33 kg/m  VS noted,  Constitutional: Pt is oriented to person, place, and time. Appears well-developed and well-nourished, in no significant distress Head: Normocephalic and atraumatic  Eyes: Conjunctivae and EOM are normal. Pupils are equal, round, and reactive to light Right Ear: External ear normal.  Left Ear: External ear normal Nose: Nose normal.  Mouth/Throat: Oropharynx is clear and moist  Neck: Normal range of motion. Neck supple. No JVD present. No tracheal deviation present or significant neck LA or mass Cardiovascular: Normal rate, regular  rhythm, normal heart sounds and intact distal pulses.   Pulmonary/Chest: Effort normal and breath sounds without rales or wheezing  Abdominal: Soft. Bowel sounds are normal. NT. No HSM  Musculoskeletal: Normal range of motion. Lymphadenopathy: Has no cervical adenopathy.  Neurological: Pt is alert and oriented to person, place, and time. Pt has  normal reflexes. No cranial nerve deficit. Motor grossly intact Skin: Skin is warm and dry. + bilat chronic rash noted, no new ulcers Psychiatric:  Has normal mood and affect. Behavior is normal.  Chronic trace edema right > left  Lab Results  Component Value Date   WBC 7.5 05/30/2016   HGB 12.8 (L) 05/30/2016   HCT 37.7 (L) 05/30/2016   PLT 192.0 05/30/2016   GLUCOSE 88 12/04/2016   CHOL 164 12/04/2016   TRIG 134.0 12/04/2016   HDL 35.20 (L) 12/04/2016   LDLDIRECT 111.0 05/30/2016   LDLCALC 102 (H) 12/04/2016   ALT 19 12/04/2016   AST 21 12/04/2016   NA 139 12/04/2016   K 3.8 12/04/2016   CL 101 12/04/2016   CREATININE 0.97 12/04/2016   BUN 13 12/04/2016   CO2 32 12/04/2016   TSH 1.48 11/29/2016   PSA 2.59 05/30/2016   INR 3.0 11/23/2016   HGBA1C 6.0 12/04/2016   MICROALBUR 1.2 05/30/2016       Assessment & Plan:

## 2016-12-05 ENCOUNTER — Encounter: Payer: Self-pay | Admitting: Internal Medicine

## 2016-12-05 LAB — HIV ANTIBODY (ROUTINE TESTING W REFLEX): HIV: NONREACTIVE

## 2016-12-05 NOTE — Progress Notes (Signed)
   Subjective:    Patient ID: [name], male    DOB: [birth year], 61 y.o.   MRN: [id number]  HPI    Review of Systems     Objective:   Physical Exam        Assessment & Plan:

## 2016-12-06 ENCOUNTER — Other Ambulatory Visit: Payer: Self-pay | Admitting: Emergency Medicine

## 2016-12-06 MED ORDER — LIDOCAINE 5 % EX PTCH: MEDICATED_PATCH | CUTANEOUS | 1 refills | Status: DC

## 2016-12-08 LAB — CUP PACEART REMOTE DEVICE CHECK
Date Time Interrogation Session: 20180113194013
MDC IDC PG IMPLANT DT: 20160622

## 2016-12-08 NOTE — Progress Notes (Signed)
[facility] summary report received. Battery status OK. Normal device function. No new symptom episodes, tachy episodes, brady, or pause episodes. 15 AF 99% +warfarin. Monthly summary reports and ROV/PRN

## 2016-12-09 NOTE — Assessment & Plan Note (Signed)
Overall doing well, age appropriate education and counseling updated, referrals for preventative services and immunizations addressed, dietary and smoking counseling addressed, most recent labs reviewed.  I have personally reviewed and have noted:  1) the patient's medical and social history 2) The pt's use of alcohol, tobacco, and illicit drugs 3) The patient's current medications and supplements 4) Functional ability including ADL's, fall risk, home safety risk, hearing and visual impairment 5) Diet and physical activities 6) Evidence for depression or mood disorder 7) The patient's height, weight, and BMI have been recorded in the chart  I have made referrals, and provided counseling and education based on review of the above  

## 2016-12-09 NOTE — Assessment & Plan Note (Signed)
Asympt, recent onset, for urology referral

## 2016-12-09 NOTE — Assessment & Plan Note (Deleted)
Overall doing well, age appropriate education and counseling updated, referrals for preventative services and immunizations addressed, dietary and smoking counseling addressed, most recent labs reviewed.  I have personally reviewed and have noted:  1) the patient's medical and social history 2) The pt's use of alcohol, tobacco, and illicit drugs 3) The patient's current medications and supplements 4) Functional ability including ADL's, fall risk, home safety risk, hearing and visual impairment 5) Diet and physical activities 6) Evidence for depression or mood disorder 7) The patient's height, weight, and BMI have been recorded in the chart  I have made referrals, and provided counseling and education based on review of the above  

## 2016-12-09 NOTE — Assessment & Plan Note (Signed)
stable overall by history and exam, recent data reviewed with pt, and pt to continue medical treatment as before,  to f/u any worsening symptoms or concerns Lab Results  Component Value Date   HGBA1C 6.0 12/04/2016    

## 2016-12-09 NOTE — Assessment & Plan Note (Signed)
[place] for lidex asd,  to f/u any worsening symptoms or concerns

## 2016-12-12 DIAGNOSIS — G4733 Obstructive sleep apnea (adult) (pediatric): Secondary | ICD-10-CM | POA: Diagnosis not present

## 2016-12-13 ENCOUNTER — Telehealth: Payer: Self-pay | Admitting: Internal Medicine

## 2016-12-13 DIAGNOSIS — J189 Pneumonia, unspecified organism: Secondary | ICD-10-CM

## 2016-12-13 HISTORY — DX: Pneumonia, unspecified organism: J18.9

## 2016-12-13 NOTE — Telephone Encounter (Signed)
Pt called stating insurance needs a pa for lidocaine (LIDODERM) 5 %. Please help.

## 2016-12-16 ENCOUNTER — Other Ambulatory Visit: Payer: Self-pay | Admitting: Internal Medicine

## 2016-12-18 NOTE — Telephone Encounter (Signed)
PA started via covermymeds. KEY: [id number]

## 2016-12-21 ENCOUNTER — Ambulatory Visit: Payer: Medicare HMO

## 2016-12-21 NOTE — Telephone Encounter (Signed)
PA approved.   Will you call pt and have them call pharmacy when he is ready for refill.

## 2016-12-26 ENCOUNTER — Telehealth: Payer: Self-pay | Admitting: Internal Medicine

## 2016-12-26 ENCOUNTER — Ambulatory Visit (INDEPENDENT_AMBULATORY_CARE_PROVIDER_SITE_OTHER): Payer: Medicare HMO | Admitting: *Deleted

## 2016-12-26 DIAGNOSIS — R55 Syncope and collapse: Secondary | ICD-10-CM | POA: Diagnosis not present

## 2016-12-26 DIAGNOSIS — L989 Disorder of the skin and subcutaneous tissue, unspecified: Secondary | ICD-10-CM

## 2016-12-26 NOTE — Telephone Encounter (Signed)
Patient is requesting a referral to the dermatology office he goes to. They informed him his has expired.   [facility] Mila Palmer. P[facility]ardo[facility], M.D.  [phone] [address] [place], [place] [address]

## 2016-12-27 NOTE — Progress Notes (Signed)
Carelink Summary Report / Loop Recorder 

## 2016-12-27 NOTE — Telephone Encounter (Signed)
Called pt and LVM about the referral being done

## 2016-12-27 NOTE — Telephone Encounter (Signed)
Referral done

## 2016-12-28 ENCOUNTER — Ambulatory Visit: Payer: Medicare HMO

## 2016-12-30 ENCOUNTER — Other Ambulatory Visit: Payer: Self-pay | Admitting: Internal Medicine

## 2016-12-31 ENCOUNTER — Emergency Department (HOSPITAL_COMMUNITY): Payer: Medicare HMO

## 2016-12-31 ENCOUNTER — Inpatient Hospital Stay (HOSPITAL_COMMUNITY)
Admission: EM | Admit: 2016-12-31 | Discharge: 2017-01-08 | DRG: 291 | Disposition: A | Discharge status: Home / Self Care | Payer: Medicare HMO | Attending: Family Medicine | Admitting: Family Medicine

## 2016-12-31 ENCOUNTER — Encounter (HOSPITAL_COMMUNITY): Payer: Self-pay

## 2016-12-31 ENCOUNTER — Telehealth: Payer: Self-pay | Admitting: Cardiology

## 2016-12-31 DIAGNOSIS — I429 Cardiomyopathy, unspecified: Secondary | ICD-10-CM | POA: Diagnosis present

## 2016-12-31 DIAGNOSIS — I480 Paroxysmal atrial fibrillation: Secondary | ICD-10-CM | POA: Diagnosis not present

## 2016-12-31 DIAGNOSIS — R0902 Hypoxemia: Secondary | ICD-10-CM

## 2016-12-31 DIAGNOSIS — N401 Enlarged prostate with lower urinary tract symptoms: Secondary | ICD-10-CM | POA: Diagnosis present

## 2016-12-31 DIAGNOSIS — J181 Lobar pneumonia, unspecified organism: Secondary | ICD-10-CM | POA: Diagnosis not present

## 2016-12-31 DIAGNOSIS — M62838 Other muscle spasm: Secondary | ICD-10-CM | POA: Diagnosis present

## 2016-12-31 DIAGNOSIS — Z96651 Presence of right artificial knee joint: Secondary | ICD-10-CM | POA: Diagnosis present

## 2016-12-31 DIAGNOSIS — G4733 Obstructive sleep apnea (adult) (pediatric): Secondary | ICD-10-CM | POA: Diagnosis present

## 2016-12-31 DIAGNOSIS — J440 Chronic obstructive pulmonary disease with acute lower respiratory infection: Secondary | ICD-10-CM | POA: Diagnosis present

## 2016-12-31 DIAGNOSIS — I083 Combined rheumatic disorders of mitral, aortic and tricuspid valves: Secondary | ICD-10-CM | POA: Diagnosis not present

## 2016-12-31 DIAGNOSIS — Z9981 Dependence on supplemental oxygen: Secondary | ICD-10-CM

## 2016-12-31 DIAGNOSIS — Z87891 Personal history of nicotine dependence: Secondary | ICD-10-CM | POA: Diagnosis not present

## 2016-12-31 DIAGNOSIS — Z825 Family history of asthma and other chronic lower respiratory diseases: Secondary | ICD-10-CM

## 2016-12-31 DIAGNOSIS — Z7951 Long term (current) use of inhaled steroids: Secondary | ICD-10-CM

## 2016-12-31 DIAGNOSIS — G629 Polyneuropathy, unspecified: Secondary | ICD-10-CM | POA: Diagnosis present

## 2016-12-31 DIAGNOSIS — Z950 Presence of cardiac pacemaker: Secondary | ICD-10-CM

## 2016-12-31 DIAGNOSIS — I5023 Acute on chronic systolic (congestive) heart failure: Secondary | ICD-10-CM

## 2016-12-31 DIAGNOSIS — K219 Gastro-esophageal reflux disease without esophagitis: Secondary | ICD-10-CM | POA: Diagnosis present

## 2016-12-31 DIAGNOSIS — I5021 Acute systolic (congestive) heart failure: Secondary | ICD-10-CM | POA: Diagnosis not present

## 2016-12-31 DIAGNOSIS — I5043 Acute on chronic combined systolic (congestive) and diastolic (congestive) heart failure: Secondary | ICD-10-CM | POA: Diagnosis present

## 2016-12-31 DIAGNOSIS — Z6841 Body Mass Index (BMI) 40.0 and over, adult: Secondary | ICD-10-CM

## 2016-12-31 DIAGNOSIS — E785 Hyperlipidemia, unspecified: Secondary | ICD-10-CM | POA: Diagnosis present

## 2016-12-31 DIAGNOSIS — J439 Emphysema, unspecified: Secondary | ICD-10-CM | POA: Diagnosis not present

## 2016-12-31 DIAGNOSIS — E039 Hypothyroidism, unspecified: Secondary | ICD-10-CM | POA: Diagnosis present

## 2016-12-31 DIAGNOSIS — I482 Chronic atrial fibrillation: Secondary | ICD-10-CM | POA: Diagnosis not present

## 2016-12-31 DIAGNOSIS — J9621 Acute and chronic respiratory failure with hypoxia: Secondary | ICD-10-CM | POA: Diagnosis not present

## 2016-12-31 DIAGNOSIS — R350 Frequency of micturition: Secondary | ICD-10-CM | POA: Diagnosis present

## 2016-12-31 DIAGNOSIS — I5022 Chronic systolic (congestive) heart failure: Secondary | ICD-10-CM

## 2016-12-31 DIAGNOSIS — E876 Hypokalemia: Secondary | ICD-10-CM | POA: Diagnosis not present

## 2016-12-31 DIAGNOSIS — Z7901 Long term (current) use of anticoagulants: Secondary | ICD-10-CM | POA: Diagnosis not present

## 2016-12-31 DIAGNOSIS — J4489 Other specified chronic obstructive pulmonary disease: Secondary | ICD-10-CM | POA: Diagnosis present

## 2016-12-31 DIAGNOSIS — Z9109 Other allergy status, other than to drugs and biological substances: Secondary | ICD-10-CM

## 2016-12-31 DIAGNOSIS — I452 Bifascicular block: Secondary | ICD-10-CM | POA: Diagnosis present

## 2016-12-31 DIAGNOSIS — Z888 Allergy status to other drugs, medicaments and biological substances status: Secondary | ICD-10-CM

## 2016-12-31 DIAGNOSIS — D649 Anemia, unspecified: Secondary | ICD-10-CM | POA: Diagnosis present

## 2016-12-31 DIAGNOSIS — K5909 Other constipation: Secondary | ICD-10-CM | POA: Diagnosis present

## 2016-12-31 DIAGNOSIS — R0602 Shortness of breath: Secondary | ICD-10-CM | POA: Diagnosis not present

## 2016-12-31 DIAGNOSIS — F419 Anxiety disorder, unspecified: Secondary | ICD-10-CM | POA: Diagnosis present

## 2016-12-31 DIAGNOSIS — I10 Essential (primary) hypertension: Secondary | ICD-10-CM | POA: Diagnosis not present

## 2016-12-31 DIAGNOSIS — I428 Other cardiomyopathies: Secondary | ICD-10-CM | POA: Diagnosis not present

## 2016-12-31 DIAGNOSIS — R001 Bradycardia, unspecified: Secondary | ICD-10-CM | POA: Diagnosis present

## 2016-12-31 DIAGNOSIS — I110 Hypertensive heart disease with heart failure: Principal | ICD-10-CM | POA: Diagnosis present

## 2016-12-31 DIAGNOSIS — J189 Pneumonia, unspecified organism: Secondary | ICD-10-CM | POA: Diagnosis not present

## 2016-12-31 DIAGNOSIS — E784 Other hyperlipidemia: Secondary | ICD-10-CM | POA: Diagnosis not present

## 2016-12-31 DIAGNOSIS — I8390 Asymptomatic varicose veins of unspecified lower extremity: Secondary | ICD-10-CM | POA: Diagnosis present

## 2016-12-31 DIAGNOSIS — J9620 Acute and chronic respiratory failure, unspecified whether with hypoxia or hypercapnia: Secondary | ICD-10-CM

## 2016-12-31 DIAGNOSIS — I252 Old myocardial infarction: Secondary | ICD-10-CM

## 2016-12-31 DIAGNOSIS — E119 Type 2 diabetes mellitus without complications: Secondary | ICD-10-CM | POA: Diagnosis present

## 2016-12-31 DIAGNOSIS — I4891 Unspecified atrial fibrillation: Secondary | ICD-10-CM | POA: Diagnosis not present

## 2016-12-31 DIAGNOSIS — J449 Chronic obstructive pulmonary disease, unspecified: Secondary | ICD-10-CM | POA: Diagnosis present

## 2016-12-31 DIAGNOSIS — F329 Major depressive disorder, single episode, unspecified: Secondary | ICD-10-CM | POA: Diagnosis present

## 2016-12-31 DIAGNOSIS — M797 Fibromyalgia: Secondary | ICD-10-CM | POA: Diagnosis present

## 2016-12-31 DIAGNOSIS — I5082 Biventricular heart failure: Secondary | ICD-10-CM | POA: Diagnosis present

## 2016-12-31 DIAGNOSIS — R000 Tachycardia, unspecified: Secondary | ICD-10-CM | POA: Diagnosis not present

## 2016-12-31 DIAGNOSIS — F410 Panic disorder [episodic paroxysmal anxiety] without agoraphobia: Secondary | ICD-10-CM | POA: Diagnosis present

## 2016-12-31 DIAGNOSIS — Z9119 Patient's noncompliance with other medical treatment and regimen: Secondary | ICD-10-CM

## 2016-12-31 DIAGNOSIS — L309 Dermatitis, unspecified: Secondary | ICD-10-CM | POA: Diagnosis present

## 2016-12-31 DIAGNOSIS — H409 Unspecified glaucoma: Secondary | ICD-10-CM | POA: Diagnosis present

## 2016-12-31 DIAGNOSIS — Z79899 Other long term (current) drug therapy: Secondary | ICD-10-CM

## 2016-12-31 HISTORY — DX: Unspecified asthma, uncomplicated: J45.909

## 2016-12-31 HISTORY — DX: Pneumonia, unspecified organism: J18.9

## 2016-12-31 LAB — CBC
HCT: 39.7 % (ref 39.0–52.0)
Hemoglobin: 12.5 g/dL — ABNORMAL LOW (ref 13.0–17.0)
MCH: 29.3 pg (ref 26.0–34.0)
MCHC: 31.5 g/dL (ref 30.0–36.0)
MCV: 93 fL (ref 78.0–100.0)
PLATELETS: 159 10*3/uL (ref 150–400)
RBC: 4.27 MIL/uL (ref 4.22–5.81)
RDW: 15.2 % (ref 11.5–15.5)
WBC: 6.9 10*3/uL (ref 4.0–10.5)

## 2016-12-31 LAB — BASIC METABOLIC PANEL
Anion gap: 7 (ref 5–15)
BUN: 7 mg/dL (ref 6–20)
CALCIUM: 8.8 mg/dL — AB (ref 8.9–10.3)
CO2: 28 mmol/L (ref 22–32)
CREATININE: 0.78 mg/dL (ref 0.61–1.24)
Chloride: 105 mmol/L (ref 101–111)
Glucose, Bld: 95 mg/dL (ref 65–99)
Potassium: 4.4 mmol/L (ref 3.5–5.1)
Sodium: 140 mmol/L (ref 135–145)

## 2016-12-31 LAB — TROPONIN I

## 2016-12-31 LAB — PROTIME-INR
INR: 1.96
Prothrombin Time: 22.6 seconds — ABNORMAL HIGH (ref 11.4–15.2)

## 2016-12-31 LAB — BRAIN NATRIURETIC PEPTIDE: B Natriuretic Peptide: 395.2 pg/mL — ABNORMAL HIGH (ref 0.0–100.0)

## 2016-12-31 MED ORDER — ZOLPIDEM TARTRATE 5 MG PO TABS: 10.0000 mg | ORAL_TABLET | Freq: Every day | ORAL | Status: DC

## 2016-12-31 MED ORDER — MUPIROCIN CALCIUM 2 % EX CREA
TOPICAL_CREAM | Freq: Two times a day (BID) | CUTANEOUS | Status: DC
  Administered 2016-12-31: 1 via TOPICAL
  Administered 2017-01-01 – 2017-01-03 (×6): via TOPICAL
  Administered 2017-01-04: 1 via TOPICAL
  Administered 2017-01-04 – 2017-01-07 (×6): via TOPICAL
  Administered 2017-01-07: 1 via TOPICAL
  Administered 2017-01-08: 08:00:00 via TOPICAL
  Filled 2016-12-31 (×3): qty 15

## 2016-12-31 MED ORDER — LEVOTHYROXINE SODIUM 25 MCG PO TABS
25.0000 ug | ORAL_TABLET | Freq: Every day | ORAL | Status: DC
  Administered 2017-01-01 – 2017-01-08 (×8): 25 ug via ORAL
  Filled 2016-12-31 (×8): qty 1

## 2016-12-31 MED ORDER — HYDRALAZINE HCL 20 MG/ML IJ SOLN: 10.0000 mg | Freq: Four times a day (QID) | INTRAMUSCULAR | Status: DC | PRN

## 2016-12-31 MED ORDER — LINACLOTIDE 145 MCG PO CAPS
145.0000 ug | ORAL_CAPSULE | Freq: Every day | ORAL | Status: DC | PRN
  Administered 2017-01-04: 145 ug via ORAL
  Filled 2016-12-31 (×4): qty 1

## 2016-12-31 MED ORDER — DEXTROSE 5 % IV SOLN
1.0000 g | INTRAVENOUS | Status: DC
  Administered 2017-01-01 – 2017-01-02 (×2): 1 g via INTRAVENOUS
  Filled 2016-12-31 (×3): qty 10

## 2016-12-31 MED ORDER — DILTIAZEM HCL ER COATED BEADS 120 MG PO CP24
120.0000 mg | ORAL_CAPSULE | Freq: Two times a day (BID) | ORAL | Status: DC
  Administered 2016-12-31 – 2017-01-03 (×6): 120 mg via ORAL
  Filled 2016-12-31 (×7): qty 1

## 2016-12-31 MED ORDER — ACETAMINOPHEN 650 MG RE SUPP: 650.0000 mg | Freq: Four times a day (QID) | RECTAL | Status: DC | PRN

## 2016-12-31 MED ORDER — TRAZODONE HCL 50 MG PO TABS
25.0000 mg | ORAL_TABLET | Freq: Every evening | ORAL | Status: DC | PRN
Start: 2016-12-31 — End: 2017-01-08
  Administered 2016-12-31 – 2017-01-07 (×2): 25 mg via ORAL
  Filled 2016-12-31 (×2): qty 1

## 2016-12-31 MED ORDER — FLUTICASONE FUROATE-VILANTEROL 200-25 MCG/INH IN AEPB
1.0000 | INHALATION_SPRAY | Freq: Every day | RESPIRATORY_TRACT | Status: DC
  Administered 2016-12-31 – 2017-01-08 (×9): 1 via RESPIRATORY_TRACT
  Filled 2016-12-31 (×2): qty 28

## 2016-12-31 MED ORDER — PANTOPRAZOLE SODIUM 40 MG PO TBEC
40.0000 mg | DELAYED_RELEASE_TABLET | Freq: Every day | ORAL | Status: DC
  Administered 2016-12-31 – 2017-01-08 (×9): 40 mg via ORAL
  Filled 2016-12-31 (×9): qty 1

## 2016-12-31 MED ORDER — POTASSIUM CHLORIDE CRYS ER 20 MEQ PO TBCR
40.0000 meq | EXTENDED_RELEASE_TABLET | Freq: Every day | ORAL | Status: DC
  Administered 2016-12-31 – 2017-01-04 (×5): 40 meq via ORAL
  Filled 2016-12-31 (×5): qty 2

## 2016-12-31 MED ORDER — DEXTROSE 5 % IV SOLN
1.0000 g | Freq: Once | INTRAVENOUS | Status: DC
  Filled 2016-12-31: qty 10

## 2016-12-31 MED ORDER — SODIUM CHLORIDE 0.9% FLUSH
3.0000 mL | Freq: Two times a day (BID) | INTRAVENOUS | Status: DC
  Administered 2016-12-31 – 2017-01-07 (×11): 3 mL via INTRAVENOUS

## 2016-12-31 MED ORDER — CARISOPRODOL 350 MG PO TABS
350.0000 mg | ORAL_TABLET | Freq: Three times a day (TID) | ORAL | Status: DC
  Administered 2016-12-31 – 2017-01-08 (×23): 350 mg via ORAL
  Filled 2016-12-31 (×23): qty 1

## 2016-12-31 MED ORDER — ALBUTEROL SULFATE (2.5 MG/3ML) 0.083% IN NEBU: 2.5000 mg | INHALATION_SOLUTION | Freq: Four times a day (QID) | RESPIRATORY_TRACT | Status: DC | PRN

## 2016-12-31 MED ORDER — WARFARIN - PHARMACIST DOSING INPATIENT
Freq: Every day | Status: DC
  Administered 2017-01-03 – 2017-01-05 (×3)

## 2016-12-31 MED ORDER — HYDROCORTISONE 2.5 % EX CREA
1.0000 "application " | TOPICAL_CREAM | Freq: Two times a day (BID) | CUTANEOUS | Status: DC
  Administered 2017-01-01 – 2017-01-07 (×14): 1 via TOPICAL
  Filled 2016-12-31 (×2): qty 30

## 2016-12-31 MED ORDER — DOXEPIN HCL 25 MG PO CAPS
25.0000 mg | ORAL_CAPSULE | Freq: Two times a day (BID) | ORAL | Status: DC
  Administered 2016-12-31 – 2017-01-08 (×16): 25 mg via ORAL
  Filled 2016-12-31 (×17): qty 1

## 2016-12-31 MED ORDER — WARFARIN SODIUM 5 MG PO TABS: 5.0000 mg | ORAL_TABLET | Freq: Once | ORAL | Status: DC

## 2016-12-31 MED ORDER — SERTRALINE HCL 100 MG PO TABS
200.0000 mg | ORAL_TABLET | Freq: Every day | ORAL | Status: DC
  Administered 2016-12-31 – 2017-01-07 (×8): 200 mg via ORAL
  Filled 2016-12-31 (×8): qty 2

## 2016-12-31 MED ORDER — LIDOCAINE 5 % EX PTCH
1.0000 | MEDICATED_PATCH | CUTANEOUS | Status: DC
  Administered 2017-01-01 – 2017-01-08 (×8): 1 via TRANSDERMAL
  Filled 2016-12-31 (×8): qty 1

## 2016-12-31 MED ORDER — FUROSEMIDE 10 MG/ML IJ SOLN
80.0000 mg | Freq: Two times a day (BID) | INTRAMUSCULAR | Status: DC
  Administered 2016-12-31 – 2017-01-02 (×4): 80 mg via INTRAVENOUS
  Filled 2016-12-31 (×4): qty 8

## 2016-12-31 MED ORDER — NEBIVOLOL HCL 2.5 MG PO TABS
7.5000 mg | ORAL_TABLET | Freq: Every day | ORAL | Status: DC
  Administered 2016-12-31 – 2017-01-03 (×4): 7.5 mg via ORAL
  Filled 2016-12-31 (×3): qty 3
  Filled 2016-12-31: qty 1

## 2016-12-31 MED ORDER — WARFARIN SODIUM 5 MG PO TABS
5.0000 mg | ORAL_TABLET | Freq: Once | ORAL | Status: AC
  Administered 2016-12-31: 5 mg via ORAL
  Filled 2016-12-31: qty 1

## 2016-12-31 MED ORDER — DEXTROSE 5 % IV SOLN
500.0000 mg | INTRAVENOUS | Status: DC
  Administered 2017-01-01: 500 mg via INTRAVENOUS
  Filled 2016-12-31 (×3): qty 500

## 2016-12-31 MED ORDER — ZOLPIDEM TARTRATE 5 MG PO TABS
5.0000 mg | ORAL_TABLET | Freq: Every evening | ORAL | Status: DC | PRN
  Administered 2016-12-31 – 2017-01-07 (×15): 5 mg via ORAL
  Filled 2016-12-31 (×15): qty 1

## 2016-12-31 MED ORDER — ACETAMINOPHEN 325 MG PO TABS: 650.0000 mg | ORAL_TABLET | Freq: Four times a day (QID) | ORAL | Status: DC | PRN

## 2016-12-31 MED ORDER — AZITHROMYCIN 500 MG IV SOLR
500.0000 mg | Freq: Once | INTRAVENOUS | Status: AC
  Administered 2016-12-31: 500 mg via INTRAVENOUS
  Filled 2016-12-31: qty 500

## 2016-12-31 NOTE — ED Notes (Signed)
Bedside commode placed in pt's room 

## 2016-12-31 NOTE — H&P (Signed)
History and Physical    [name] QIO:[id number] DOB: [birth year] DOA: 12/31/2016  PCP: [name], MD   Patient coming from: Home  Chief Complaint: Progressive shortness of breath and weakness over the period O2 weeks  HPI: [name] is a 61 y.o. male with medical history significant for diastolic CHF with last TEE in 2016 demonstrating EF 50-55% CHF, asthma, COPD with asthma, chronic respiratory failure on intermittent use of nocturnal oxygen, atrial fibrillation - s/p DCCV in 2016, on anticoagulation therapy with Coumadin, status post permanent pacemaker, hypertension, hyperlipidemia, BPH, GERD who presented to the emergency department with complaints of progressive worsening of shortness of breath and weakness. Patient said that the symptoms were present for the last couple of weeks and during the last 48 hours has gotten worse. He will call up this morning with sensation of being smothered and gasping for air, called EMS but he is to be taken to the emergency department due to the cost of transportation. Later during the morning he called his cardiologist, they remotely interrogated his pacer and found him in to have accelerated heart rate 170 bpm and referred him to the ED for further evaluation   ED course: Upon arrival he was tachypneic with respirations of 22 with heart rate was 145 and EKG showed atrophy with RBBB and left fascicular block, NS ST- T wave abnormality His blood pressure initially was well controlled, but he was hypoxic and placed on 3 L O2 via nasal cannula that improved oxygen saturation. Blood work was unremarkable, chest x-ray showed posterior right base infiltrate with small right pleural effusion suspicious for pneumonia superimposed on underlying pulmonary vascular congestion  Review of Systems: As per HPI otherwise 10 point review of systems negative.   Ambulatory Status: Ambulates independently   Past Medical History:  Diagnosis Date  . Anemia    supposed to be taking Vit B but doesn't  . ANXIETY    takes Xanax nightly  . Arthritis   . ASTHMA    Albuterol prn and Advair daily;also takes Prednisone daily  . Asthma   . Atrial fibrillation ([place]) 09/06/2015  . Cardiomyopathy [facility])    a. EF 25% TEE July 2013; b. EF normalized 2015;  c. 03/2015 Echo: EF 40-45%, difrf HK, PASP 38 mmHg, Mild MR, sev LAE/RAE.  [name] Chronic constipation    takes OTC stool softener  . COPD (chronic obstructive pulmonary disease) ([place])   . DEPRESSION    takes Zoloft and Doxepin daily  . Diverticulitis   . DYSKINESIA, ESOPHAGUS   . Essential hypertension       . FIBROMYALGIA   . GERD (gastroesophageal reflux disease)       . Glaucoma   . HYPERLIPIDEMIA    a. Intolerant to statins.  . INSOMNIA    takes Ambien nightly  . Myocardial infarction    a. 2012 Myoview notable for prior infarct;  b. 03/2015 Lexiscan CL: EF 37%, diff HK, small area of inferior infarct from apex to base-->Med Rx.  . Myocardial infarction   . O2 dependent    uses oxygen 2 and 1/2 liters per [place] at hs  . Paroxysmal atrial fibrillation (HCC)    a. CHA2DS2VASc = 3--> takes Coumadin;  b. 03/15/2015 Successful TEE/DCCV;  c. 03/2015 recurrent afib, Amio d/c'd in setting of hyperthyroidism.  . Peripheral neuropathy ([place])   . Rash and other nonspecific skin eruption 04/12/2009   no cause found saw dermatologists x 2 and allergist  . SLEEP APNEA, OBSTRUCTIVE  a. doesn't use CPAP  . Syncope    a. 03/2015 s/p MDT LINQ.  [name] Type II diabetes mellitus ([place])         Past Surgical History:  Procedure Laterality Date  . ACNE CYST REMOVAL     2 on back   . CARDIAC CATHETERIZATION N/A 03/21/2016   Procedure: Right/Left Heart Cath and Coronary Angiography;  Surgeon: Larey Dresser, MD;  Location: [place] CV LAB;  Service: Cardiovascular;  Laterality: N/A;  . CARDIOVERSION  04/18/2012   Procedure: CARDIOVERSION;  Surgeon: Fay Records, MD;  Location: [place];  Service: Cardiovascular;   Laterality: N/A;  . CARDIOVERSION  04/25/2012   Procedure: CARDIOVERSION;  Surgeon: Thayer Headings, MD;  Location: [place];  Service: Cardiovascular;  Laterality: N/A;  . CARDIOVERSION  04/25/2012   Procedure: CARDIOVERSION;  Surgeon: Fay Records, MD;  Location: [place];  Service: Cardiovascular;  Laterality: N/A;  . CARDIOVERSION  05/09/2012   Procedure: CARDIOVERSION;  Surgeon: Sherren Mocha, MD;  Location: [place];  Service: Cardiovascular;  Laterality: N/A;  changed from crenshaw to cooper by trish/leone-endo  . CARDIOVERSION N/A 03/15/2015   Procedure: CARDIOVERSION;  Surgeon: Thayer Headings, MD;  Location: [facility] ENDOSCOPY;  Service: Cardiovascular;  Laterality: N/A;  . COLONOSCOPY    . COLONOSCOPY WITH PROPOFOL N/A 10/21/2014   Procedure: COLONOSCOPY WITH PROPOFOL;  Surgeon: Ladene Artist, MD;  Location: [facility] ENDOSCOPY;  Service: Endoscopy;  Laterality: N/A;  . EP IMPLANTABLE DEVICE N/A 04/06/2015   Procedure: Loop Recorder Insertion;  Surgeon: Evans Lance, MD;  Location: [place] CV LAB;  Service: Cardiovascular;  Laterality: N/A;  . ESOPHAGOGASTRODUODENOSCOPY    . TEE WITHOUT CARDIOVERSION  04/25/2012   Procedure: TRANSESOPHAGEAL ECHOCARDIOGRAM (TEE);  Surgeon: Thayer Headings, MD;  Location: [place];  Service: Cardiovascular;  Laterality: N/A;  . TEE WITHOUT CARDIOVERSION N/A 03/15/2015   Procedure: TRANSESOPHAGEAL ECHOCARDIOGRAM (TEE);  Surgeon: Thayer Headings, MD;  Location: [place];  Service: Cardiovascular;  Laterality: N/A;  . TONSILLECTOMY    . TOTAL KNEE ARTHROPLASTY Right 06/15/2014   Procedure: TOTAL KNEE ARTHROPLASTY;  Surgeon: Renette Butters, MD;  Location: [place];  Service: Orthopedics;  Laterality: Right;    Social History   Social History  . Marital status: Divorced    Spouse name: N/A  . Number of children: 2  . Years of education: N/A   Occupational History  . retired/disabled. prev worked in Therapist, sports. Disabled   Social History Main Topics   . Smoking status: Former Smoker    Packs/day: 2.00    Years: 30.00    Types: Cigarettes    Quit date: 10/16/2007  . Smokeless tobacco: Never Used  . Alcohol use No  . Drug use: No  . Sexual activity: Not on file   Other Topics Concern  . Not on file   Social History Narrative   Lives alone.    Allergies  Allergen Reactions  . Amiodarone Other (See Comments)    hyperthyroidism  . Pravastatin Sodium Other (See Comments)    myalgia  . Simvastatin Other (See Comments)    severe leg cramps  . Statins Other (See Comments)    myalgia  . Tape Other (See Comments)    Skin Tears Use Paper Tape Only    Family History  Problem Relation Age of Onset  . COPD Mother   . Asthma Mother   . Colon polyps Mother   . Allergies Mother   . Hypothyroidism Mother   .  Asthma Maternal Grandmother   . Colon cancer Neg Hx     Prior to Admission medications   Medication Sig Start Date End Date Taking? Authorizing Provider  ADVAIR DISKUS 250-50 MCG/DOSE AEPB INHALE 1 PUFF EVERY DAY AS DIRECTED 09/17/16   [name], MD  albuterol (PROVENTIL HFA;VENTOLIN HFA) 108 (90 BASE) MCG/ACT inhaler Inhale 2 puffs into the lungs 4 times a day as needed    Historical Provider, MD  ALPRAZolam Duanne Moron) 0.5 MG tablet 1-2 tab by mouth at bedtime as needed for sleep 08/14/16   [name], MD  BYSTOLIC 5 MG tablet TAKE 1 AND 1/2 TABLETS BY MOUTH EVERY DAY 04/25/16   Deboraha Sprang, MD  carisoprodol (SOMA) 350 MG tablet TAKE 1 TABLET BY MOUTH 3 TIMES A DAY AS NEEDED FOR MUSCLE SPASMS 10/24/16   [name], MD  diltiazem (CARDIZEM CD) 120 MG 24 hr capsule Take 1 capsule (120 mg total) by mouth 2 (two) times daily. 08/17/16   Deboraha Sprang, MD  doxepin [facility]) 25 MG capsule TAKE ONE CAPSULE BY MOUTH TWICE A DAY 05/25/16   [name], MD  fluocinonide cream (LIDEX) 6.44 % Apply 1 application topically 2 (two) times daily. 11/29/15   [name], MD  hydrocortisone 2.5 % cream Apply 1 application topically 2  (two) times daily. itching    Historical Provider, MD  hydrocortisone 2.5 % cream Apply topically.    Historical Provider, MD  hydrocortisone 2.5 % cream APPLY TWICE A DAY 12/17/16   [name], MD  ibuprofen (ADVIL,MOTRIN) 200 MG tablet Take 400 mg by mouth 2 (two) times daily.    Historical Provider, MD  levothyroxine (SYNTHROID, LEVOTHROID) 25 MCG tablet TAKE 1 TABLET BY MOUTH EVERY DAY BEFORE BREAKFAST 08/27/16   Elayne Snare, MD  lidocaine (LIDODERM) 5 % Apply 1 patch every 12 hrs to affected area. Do not use more than one patch in 24 hrs. 12/06/16   [name], MD  Linaclotide [facility]) 145 MCG CAPS capsule Take 145 mcg by mouth daily as needed (Crampy pain).     Historical Provider, MD  omeprazole (PRILOSEC) 20 MG capsule Take 1 capsule (20 mg total) by mouth daily with supper. 08/27/16   [name], MD  potassium chloride SA (K-DUR,KLOR-CON) 20 MEQ tablet Take 40 mEq by mouth daily.    Historical Provider, MD  sertraline (ZOLOFT) 100 MG tablet TAKE 2 TABLETS BY MOUTH AT BEDTIME 02/17/16   [name], MD  silver sulfADIAZINE (SILVADENE) 1 % cream APPLY TO AFFECTED AREA TOPICALLY EVERY DAY 12/31/16   [name], MD  torsemide (DEMADEX) 20 MG tablet Take 3 tablets (60 mg total) by mouth 2 (two) times daily. 03/15/16   Deboraha Sprang, MD  traZODone (DESYREL) 100 MG tablet TAKE 1/2 TABLET BY MOUTH AT BEDTIME AS NEEDED 05/17/16   [name], MD  VENTOLIN HFA 108 [id number] Base) MCG/ACT inhaler INHALE 2 PUFFS 4 TIMES A DAY 10/16/16   [name], MD  warfarin (COUMADIN) 5 MG tablet Take as directed by anticoagulation clinic 11/02/16   [name], MD  zolpidem (AMBIEN) 10 MG tablet TAKE 1 TABLET BY MOUTH AT BEDTIME 10/24/16   [name], MD    Physical Exam: Vitals:   12/31/16 1515 12/31/16 1545 12/31/16 1600 12/31/16 1645  BP: (!) 131/95 (!) 177/95 (!) 153/119 (!) 124/111  Pulse: (!) 51 96 93 (!) 122  Resp: 19 (!) 22 20 (!)  25  Temp:      TempSrc:      SpO2: 99% 100% 97% 100%  Weight:        Height:         General: Appears And moderate respiratory distress sitting in a tripod position Eyes: PERRLA, EOMI, normal lids, iris ENT:  grossly normal hearing, lips & tongue, mucous membranes moist and intact Neck: no lymphoadenopathy, masses or thyromegaly Cardiovascular:  distant, irregulrly irregular S1 and S2, no m/r/g. No JVD sitting upright, carotid bruits, 3+ pitting LE edema  bilaterally  Respiratory: bilateral no wheezes, rales, rhonchi or cracles. Normal respiratory effort. No accessory muscle use observed Abdomen: soft, non-tender, non-distended, no organomegaly or masses appreciated. BS present in all quadrants Skin:  mid back and the forearms bilaterally have descrete lesions - purplish red in color, crausted Musculoskeletal: grossly normal tone BUE/BLE, good ROM, no bony abnormality or joint deformities observed Psychiatric: grossly normal mood and affect, speech fluent and appropriate, alert and oriented x3 Neurologic: CN II-XII grossly intact, moves all extremities in coordinated fashion, sensation intact  Labs on Admission: I have personally reviewed following labs and imaging studies  CBC, BMP  GFR: Estimated Creatinine Clearance (by C-G formula based on SCr of 0.78 mg/dL) Male: 110.4 mL/min Male: 133.5 mL/min   Creatinine Clearance: Estimated Creatinine Clearance (by C-G formula based on SCr of 0.78 mg/dL) Male: 110.4 mL/min Male: 133.5 mL/min    Radiological Exams on Admission: Dg Chest 2 View  Result Date: 12/31/2016 CLINICAL DATA:  Shortness of breath with lower extremity and upper extremity edema EXAM: CHEST  2 VIEW COMPARISON:  August 31, 2015 FINDINGS: There is a small right pleural effusion with patchy focal opacity in the posterior right base, felt to represent a small area of pneumonia. Lungs elsewhere are clear. There is cardiomegaly with pulmonary venous hypertension. No adenopathy. There is a loop recorder on the left. There is  degenerative change in the thoracic spine. IMPRESSION: Underlying pulmonary vascular congestion. Small focus of infiltrate posterior right base with small right pleural effusion. Suspect localized pneumonia in this area. No evident adenopathy. Followup PA and lateral chest radiographs recommended in 3-4 weeks following trial of antibiotic therapy to ensure resolution and exclude underlying malignancy. Electronically Signed   By: Lowella Grip III M.D.   On: 12/31/2016 13:00    EKG: Independently reviewed - Afib, RBBB, left fascicular block, non specific ST-TW changes  Assessment/Plan Principal Problem:   CAP (community acquired pneumonia) Active Problems:   Essential hypertension   GERD   Anticoagulated on warfarin   COPD (chronic obstructive pulmonary disease) (HCC)   Hyperlipidemia   Atrial fibrillation (HCC)   Acute on chronic respiratory failure (HCC)   CAP Continue antibiotic therapy, incentive spirometry, expectorant prn, monitor WBC count and temperature  Acute on chronic respiratory failure - multifactorial, associated with CAP, CHF exacerbation superimposed on COPD Patient uses nocturnal O2 at home intermittently. Currently O2 requirement increased to continuous O2 at 3L Continue to treat underlying issues and wean off O2 as tolerated  Acute on chronic CHF  - mist likely secondary to non-compliance Patient is supposed to be on 60 mg of Torsemide bid, but d/t frequent trips to the bathroom he takes 60 mg only once a day Will hold oral Torsemide and start IV Lasix 80 mg bid Follow renal function, daily I/O and daily weights  Afib - on Coumadin anticoagulation therapy Currently rate controlled, patient is free of chest pain Continue to monitor, will ask pharmacy to  manage coumadin  GERD - continue Protonix  Hypertension - currently stable Continue home medication and adjust the doses if needed depending on the BP readings  Skin lesion - impetigo dermatitis vs viral  rash Will order viral and bacterial culture and start topical antibiotic ointment  Hypothyroidism - most recent TSH 1.48 on 11/29/2016 Continue Synthroid and if needed, adjust the dose  DVT prophylaxis: heparin Code Status: full Family Communication: at bedside Disposition Plan: telemetry Consults called: none Admission status: inpatient   Vineyard Grice, [place] Pager: [phone] [facility]  If 7PM-7AM, please contact night-coverage [email or web address] Password [email or web address]  12/31/2016, 4:55 PM

## 2016-12-31 NOTE — Progress Notes (Signed)
ANTICOAGULATION CONSULT NOTE - Initial Consult  Pharmacy Consult for Warfarin Indication: atrial fibrillation  Allergies  Allergen Reactions  . Amiodarone Other (See Comments)    hyperthyroidism  . Pravastatin Sodium Other (See Comments)    myalgia  . Simvastatin Other (See Comments)    severe leg cramps  . Statins Other (See Comments)    myalgia  . Tape Other (See Comments)    Skin Tears Use Paper Tape Only    Patient Measurements: Height: 5\' 10"  (177.8 cm) Weight: (!) 302 lb (137 kg) IBW/kg (Calculated) : 73  Vital Signs: Temp: 97.9 F (36.6 C) (03/19 1347) Temp Source: Oral (03/19 1347) BP: 124/111 (03/19 1645) Pulse Rate: 122 (03/19 1645)  Labs:  Recent Labs  12/31/16 1136  HGB 12.5*  HCT 39.7  PLT 159  CREATININE 0.78  TROPONINI <0.03    Estimated Creatinine Clearance (by C-G formula based on SCr of 0.78 mg/dL) Male: 110.4 mL/min Male: 133.5 mL/min   Medical History: Past Medical History:  Diagnosis Date  . Anemia    supposed to be taking Vit B but doesn't  . ANXIETY    takes Xanax nightly  . Arthritis   . ASTHMA    Albuterol prn and Advair daily;also takes Prednisone daily  . Asthma   . Atrial fibrillation ([place]) 09/06/2015  . Cardiomyopathy [facility])    a. EF 25% TEE July 2013; b. EF normalized 2015;  c. 03/2015 Echo: EF 40-45%, difrf HK, PASP 38 mmHg, Mild MR, sev LAE/RAE.  [name] Chronic constipation    takes OTC stool softener  . COPD (chronic obstructive pulmonary disease) ([place])   . DEPRESSION    takes Zoloft and Doxepin daily  . Diverticulitis   . DYSKINESIA, ESOPHAGUS   . Essential hypertension       . FIBROMYALGIA   . GERD (gastroesophageal reflux disease)       . Glaucoma   . HYPERLIPIDEMIA    a. Intolerant to statins.  . INSOMNIA    takes Ambien nightly  . Myocardial infarction    a. 2012 Myoview notable for prior infarct;  b. 03/2015 Lexiscan CL: EF 37%, diff HK, small area of inferior infarct from apex to base-->Med Rx.  .  Myocardial infarction   . O2 dependent    uses oxygen 2 and 1/2 liters per [place] at hs  . Paroxysmal atrial fibrillation (HCC)    a. CHA2DS2VASc = 3--> takes Coumadin;  b. 03/15/2015 Successful TEE/DCCV;  c. 03/2015 recurrent afib, Amio d/c'd in setting of hyperthyroidism.  . Peripheral neuropathy ([place])   . Rash and other nonspecific skin eruption 04/12/2009   no cause found saw dermatologists x 2 and allergist  . SLEEP APNEA, OBSTRUCTIVE    a. doesn't use CPAP  . Syncope    a. 03/2015 s/p MDT LINQ.  [name] Type II diabetes mellitus (HCC)         Medications:  Prescriptions Prior to Admission  Medication Sig Dispense Refill Last Dose  . ADVAIR DISKUS 250-50 MCG/DOSE AEPB INHALE 1 PUFF EVERY DAY AS DIRECTED 60 each 3 12/30/2016 at Unknown time  . albuterol (PROVENTIL HFA;VENTOLIN HFA) 108 (90 BASE) MCG/ACT inhaler Inhale 2 puffs into the lungs 4 (four) times daily. Also may Inhale 2 puffs into the lungs 4 times a day as needed for wheezing   rescue at rescue  . ALPRAZolam (XANAX) 0.5 MG tablet 1-2 tab by mouth at bedtime as needed for sleep 180 tablet 1 Past Week at Unknown time  . BYSTOLIC  5 MG tablet TAKE 1 AND 1/2 TABLETS BY MOUTH EVERY DAY 45 tablet 2 12/30/2016 at Unknown time  . carisoprodol (SOMA) 350 MG tablet TAKE 1 TABLET BY MOUTH 3 TIMES A DAY AS NEEDED FOR MUSCLE SPASMS 90 tablet 5 Past Week at Unknown time  . diltiazem (CARDIZEM CD) 120 MG 24 hr capsule Take 1 capsule (120 mg total) by mouth 2 (two) times daily. 180 capsule 2 12/30/2016 at Unknown time  . diphenhydrAMINE (BENADRYL) 25 MG tablet Take 25 mg by mouth every 6 (six) hours as needed for itching.   Past Week at Unknown time  . doxepin (SINEQUAN) 25 MG capsule TAKE ONE CAPSULE BY MOUTH TWICE A DAY 180 capsule 1 12/30/2016 at Unknown time  . fluocinonide cream (LIDEX) 5.73 % Apply 1 application topically 2 (two) times daily. 30 g 0 12/30/2016 at Unknown time  . hydrocortisone 2.5 % cream Apply 1 application topically 2 (two) times  daily. itching   12/30/2016 at Unknown time  . hydrOXYzine (ATARAX/VISTARIL) 25 MG tablet Take 25 mg by mouth daily as needed for itching.   12/30/2016 at Unknown time  . ibuprofen (ADVIL,MOTRIN) 200 MG tablet Take 400 mg by mouth 2 (two) times daily.   12/30/2016 at Unknown time  . levothyroxine (SYNTHROID, LEVOTHROID) 25 MCG tablet TAKE 1 TABLET BY MOUTH EVERY DAY BEFORE BREAKFAST 90 tablet 2 12/30/2016 at Unknown time  . lidocaine (LIDODERM) 5 % Apply 1 patch every 12 hrs to affected area. Do not use more than one patch in 24 hrs. 100 patch 1 12/30/2016 at Unknown time  . Linaclotide (LINZESS) 145 MCG CAPS capsule Take 145 mcg by mouth daily as needed (Crampy pain).    Past Week at Unknown time  . omeprazole (PRILOSEC) 20 MG capsule Take 1 capsule (20 mg total) by mouth daily with supper. 90 capsule 1 12/30/2016 at Unknown time  . potassium chloride SA (K-DUR,KLOR-CON) 20 MEQ tablet Take 40 mEq by mouth daily.   12/30/2016 at Unknown time  . sertraline (ZOLOFT) 100 MG tablet TAKE 2 TABLETS BY MOUTH AT BEDTIME 180 tablet 3 12/30/2016 at Unknown time  . silver sulfADIAZINE (SILVADENE) 1 % cream APPLY TO AFFECTED AREA TOPICALLY EVERY DAY 50 g 3 12/30/2016 at Unknown time  . torsemide (DEMADEX) 20 MG tablet Take 3 tablets (60 mg total) by mouth 2 (two) times daily. 180 tablet 3 12/30/2016 at Unknown time  . traZODone (DESYREL) 100 MG tablet TAKE 1/2 TABLET BY MOUTH AT BEDTIME AS NEEDED 45 tablet 1 Past Week at Unknown time  . TUMS 500 MG chewable tablet Chew 500 mg by mouth daily as needed.   Past Week at Unknown time  . warfarin (COUMADIN) 5 MG tablet Take as directed by anticoagulation clinic (Patient taking differently: Take 5-10 mg by mouth See admin instructions. 5mg  on Mondays and Fridays, 10mg  all other days) 30 tablet 3 12/30/2016 at 1800  . zolpidem (AMBIEN) 10 MG tablet TAKE 1 TABLET BY MOUTH AT BEDTIME 30 tablet 5 12/30/2016 at Unknown time   Scheduled:  . [START ON 01/01/2017] azithromycin  500 mg  Intravenous Q24H  . carisoprodol  350 mg Oral TID  . [START ON 01/01/2017] cefTRIAXone (ROCEPHIN)  IV  1 g Intravenous Q24H  . diltiazem  120 mg Oral BID  . doxepin  25 mg Oral BID  . fluticasone furoate-vilanterol  1 puff Inhalation Daily  . furosemide  80 mg Intravenous Q12H  . hydrocortisone  1 application Topical BID  . [START ON  01/01/2017] levothyroxine  25 mcg Oral QAC breakfast  . [START ON 01/01/2017] lidocaine  1 patch Transdermal Q24H  . mupirocin cream   Topical BID  . nebivolol  7.5 mg Oral Daily  . pantoprazole  40 mg Oral Daily  . potassium chloride SA  40 mEq Oral Daily  . sertraline  200 mg Oral QHS  . sodium chloride flush  3 mL Intravenous Q12H  . zolpidem  5 mg Oral QHS,MR X 1   Infusions:   PRN: acetaminophen **OR** acetaminophen, albuterol, hydrALAZINE, linaclotide, traZODone  Assessment: Patient is a 63 yom admitted 3/19 w/ worsening shortness of breath and weakness. Patient is on warfarin PTA for a fib. Pharmacy was consulted to dose while inpatient.   PTA warfarin dose: 2.5mg  Mon and Fri, 5mg  all other days per patient discussion and chart review  Admission INR was slightly subtherapeutic at 1.96. Hemoglobin is low at 12.5 and platelet count within normal limits. No notes of bleeding. Will give boost dose tonight.  Goal of Therapy:  INR 2-3 Monitor platelets by anticoagulation protocol: Yes   Plan:  Warfarin 5mg  PO x1 tonight Daily INR Monitor signs/symptoms of bleeding  Demetrius Charity, PharmD Acute Care Pharmacy Resident  Pager: [phone] 12/31/2016

## 2016-12-31 NOTE — ED Provider Notes (Signed)
[facility] DEPT Provider Note   CSN: [id number] Arrival date & time: 12/31/16  1107     History   Chief Complaint Chief Complaint  Patient presents with  . Shortness of Breath    HPI [name] is a 61 y.o. male.  HPI Patient presents to the emergency department with shortness of breath with chest tightness that started last Friday.  The patient states that he has also had some tachycardia over the weekend.  He states that he called EMS this morning, but did not want to be transported by them.  Patient states that her pharmacy to the patient states that he did not take any new medications for his symptoms.  Patient states that he did contact his cardiologist.  He states that he was in atrial fibrillation need to come to the emergency department with his symptomatology. The patient denies  headache,blurred vision, neck pain, fever,  weakness, numbness, dizziness, anorexia, edema, abdominal pain, nausea, vomiting, diarrhea, rash, back pain, dysuria, hematemesis, bloody stool, near syncope, or syncope. Past Medical History:  Diagnosis Date  . Anemia    supposed to be taking Vit B but doesn't  . ANXIETY    takes Xanax nightly  . Arthritis   . ASTHMA    Albuterol prn and Advair daily;also takes Prednisone daily  . Asthma   . Atrial fibrillation ([place]) 09/06/2015  . Cardiomyopathy [facility])    a. EF 25% TEE July 2013; b. EF normalized 2015;  c. 03/2015 Echo: EF 40-45%, difrf HK, PASP 38 mmHg, Mild MR, sev LAE/RAE.  [name] Chronic constipation    takes OTC stool softener  . COPD (chronic obstructive pulmonary disease) ([place])   . DEPRESSION    takes Zoloft and Doxepin daily  . Diverticulitis   . DYSKINESIA, ESOPHAGUS   . Essential hypertension       . FIBROMYALGIA   . GERD (gastroesophageal reflux disease)       . Glaucoma   . HYPERLIPIDEMIA    a. Intolerant to statins.  . INSOMNIA    takes Ambien nightly  . Myocardial infarction    a. 2012 Myoview notable for prior infarct;   b. 03/2015 Lexiscan CL: EF 37%, diff HK, small area of inferior infarct from apex to base-->Med Rx.  . Myocardial infarction   . O2 dependent    uses oxygen 2 and 1/2 liters per [place] at hs  . Paroxysmal atrial fibrillation (HCC)    a. CHA2DS2VASc = 3--> takes Coumadin;  b. 03/15/2015 Successful TEE/DCCV;  c. 03/2015 recurrent afib, [place] d/c'd in setting of hyperthyroidism.  . Peripheral neuropathy ([place])   . Rash and other nonspecific skin eruption 04/12/2009   no cause found saw dermatologists x 2 and allergist  . SLEEP APNEA, OBSTRUCTIVE    a. doesn't use CPAP  . Syncope    a. 03/2015 s/p MDT LINQ.  [name] Type II diabetes mellitus [facility])         Patient Active Problem List   Diagnosis Date Noted  . Microhematuria 12/04/2016  . Encounter for well adult exam with abnormal findings 11/29/2015  . Atrial fibrillation ([place]) 09/06/2015  . Chest pain 09/01/2015  . Rash 05/26/2015  . Cardiomyopathy ([place])   . Type II diabetes mellitus ([place])   . Hyperlipidemia   . Acute on chronic diastolic heart failure ([place]) 03/30/2015  . Persistent atrial fibrillation ([place]) 03/30/2015  . Syncope 03/30/2015  . Anemia 02/23/2015  . Thyrotoxicosis 02/23/2015  . Hx of adenomatous colonic polyps 10/21/2014  .  Hemorrhoid 08/19/2014  . De Quervain's tenosynovitis, right 04/30/2014  . Allergic rhinitis, cause unspecified 04/25/2014  . Osteoarthritis of right knee 02/19/2014  . Encounter for therapeutic drug monitoring 11/17/2013  . Thoracic degenerative disc disease 06/17/2013  . Thoracic spondylosis without myelopathy 02/11/2013  . Lumbosacral spondylosis without myelopathy 02/11/2013  . COPD (chronic obstructive pulmonary disease) ([place]) 08/20/2012  . Bundle branch block, right and extreme left anterior fascicular 05/05/2012  . Nonischemic cardiomyopathy ([place]) 05/02/2012  . Anticoagulated on warfarin 03/21/2012  . Spongiotic dermatitis 09/26/2011  . Past myocardial infarction 04/19/2011  . Preventative health  care 04/03/2011  . UNSPECIFIED URETHRAL STRICTURE 12/26/2010  . Chronic pain syndrome 05/18/2010  . Obstructive sleep apnea 10/21/2007  . NEPHROLITHIASIS, HX OF 10/21/2007  . Anxiety 06/09/2007  . GERD 06/09/2007  . BENIGN PROSTATIC HYPERTROPHY 06/09/2007  . Morbid obesity ([place]) 06/03/2007  . Depression 06/03/2007  . Essential hypertension 06/03/2007  . INSOMNIA 06/03/2007    Past Surgical History:  Procedure Laterality Date  . ACNE CYST REMOVAL     2 on back   . CARDIAC CATHETERIZATION N/A 03/21/2016   Procedure: Right/Left Heart Cath and Coronary Angiography;  Surgeon: Larey Dresser, MD;  Location: [facility];  Service: Cardiovascular;  Laterality: N/A;  . CARDIOVERSION  04/18/2012   Procedure: CARDIOVERSION;  Surgeon: Fay Records, MD;  Location: [facility];  Service: Cardiovascular;  Laterality: N/A;  . CARDIOVERSION  04/25/2012   Procedure: CARDIOVERSION;  Surgeon: Thayer Headings, MD;  Location: [facility];  Service: Cardiovascular;  Laterality: N/A;  . CARDIOVERSION  04/25/2012   Procedure: CARDIOVERSION;  Surgeon: Fay Records, MD;  Location: [facility];  Service: Cardiovascular;  Laterality: N/A;  . CARDIOVERSION  05/09/2012   Procedure: CARDIOVERSION;  Surgeon: Sherren Mocha, MD;  Location: [place];  Service: Cardiovascular;  Laterality: N/A;  changed from crenshaw to cooper by trish/leone-endo  . CARDIOVERSION N/A 03/15/2015   Procedure: CARDIOVERSION;  Surgeon: Thayer Headings, MD;  Location[facility] ENDOSCOPY;  Service: Cardiovascular;  Laterality: N/A;  . COLONOSCOPY    . COLONOSCOPY WITH PROPOFOL N/A 10/21/2014   Procedure: COLONOSCOPY WITH PROPOFOL;  Surgeon: Ladene Artist, MD;  Location: [facility] ENDOSCOPY;  Service: Endoscopy;  Laterality: N/A;  . EP IMPLANTABLE DEVICE N/A 04/06/2015   Procedure: Loop Recorder Insertion;  Surgeon: Evans Lance, MD;  Location: [facility];  Service: Cardiovascular;  Laterality: N/A;  . ESOPHAGOGASTRODUODENOSCOPY    . TEE WITHOUT  CARDIOVERSION  04/25/2012   Procedure: TRANSESOPHAGEAL ECHOCARDIOGRAM (TEE);  Surgeon: Thayer Headings, MD;  Location: [facility];  Service: Cardiovascular;  Laterality: N/A;  . TEE WITHOUT CARDIOVERSION N/A 03/15/2015   Procedure: TRANSESOPHAGEAL ECHOCARDIOGRAM (TEE);  Surgeon: Thayer Headings, MD;  Location: [facility];  Service: Cardiovascular;  Laterality: N/A;  . TONSILLECTOMY    . TOTAL KNEE ARTHROPLASTY Right 06/15/2014   Procedure: TOTAL KNEE ARTHROPLASTY;  Surgeon: Renette Butters, MD;  Location: [facility];  Service: Orthopedics;  Laterality: Right;       Home Medications    Prior to Admission medications   Medication Sig Start Date End Date Taking? Authorizing Provider  ADVAIR DISKUS 250-50 MCG/DOSE AEPB INHALE 1 PUFF EVERY DAY AS DIRECTED 09/17/16   [name], MD  albuterol (PROVENTIL HFA;VENTOLIN HFA) 108 (90 BASE) MCG/ACT inhaler Inhale 2 puffs into the lungs 4 times a day as needed    Historical Provider, MD  ALPRAZolam Duanne Moron) 0.5 MG tablet 1-2 tab by mouth at bedtime as needed for  sleep 08/14/16   [name], MD  BYSTOLIC 5 MG tablet TAKE 1 AND 1/2 TABLETS BY MOUTH EVERY DAY 04/25/16   Deboraha Sprang, MD  carisoprodol (SOMA) 350 MG tablet TAKE 1 TABLET BY MOUTH 3 TIMES A DAY AS NEEDED FOR MUSCLE SPASMS 10/24/16   [name], MD  diltiazem (CARDIZEM CD) 120 MG 24 hr capsule Take 1 capsule (120 mg total) by mouth 2 (two) times daily. 08/17/16   Deboraha Sprang, MD  doxepin [facility]) 25 MG capsule TAKE ONE CAPSULE BY MOUTH TWICE A DAY 05/25/16   [name], MD  fluocinonide cream (LIDEX) 4.43 % Apply 1 application topically 2 (two) times daily. 11/29/15   [name], MD  hydrocortisone 2.5 % cream Apply 1 application topically 2 (two) times daily. itching    Historical Provider, MD  hydrocortisone 2.5 % cream Apply topically.    Historical Provider, MD  hydrocortisone 2.5 % cream APPLY TWICE A DAY 12/17/16   [name], MD  ibuprofen (ADVIL,MOTRIN) 200 MG tablet Take 400 mg  by mouth 2 (two) times daily.    Historical Provider, MD  levothyroxine (SYNTHROID, LEVOTHROID) 25 MCG tablet TAKE 1 TABLET BY MOUTH EVERY DAY BEFORE BREAKFAST 08/27/16   Elayne Snare, MD  lidocaine (LIDODERM) 5 % Apply 1 patch every 12 hrs to affected area. Do not use more than one patch in 24 hrs. 12/06/16   [name], MD  Linaclotide [facility]) 145 MCG CAPS capsule Take 145 mcg by mouth daily as needed (Crampy pain).     Historical Provider, MD  omeprazole (PRILOSEC) 20 MG capsule Take 1 capsule (20 mg total) by mouth daily with supper. 08/27/16   [name], MD  potassium chloride SA (K-DUR,KLOR-CON) 20 MEQ tablet Take 40 mEq by mouth daily.    Historical Provider, MD  sertraline (ZOLOFT) 100 MG tablet TAKE 2 TABLETS BY MOUTH AT BEDTIME 02/17/16   [name], MD  silver sulfADIAZINE (SILVADENE) 1 % cream APPLY TO AFFECTED AREA TOPICALLY EVERY DAY 12/31/16   [name], MD  torsemide (DEMADEX) 20 MG tablet Take 3 tablets (60 mg total) by mouth 2 (two) times daily. 03/15/16   Deboraha Sprang, MD  traZODone (DESYREL) 100 MG tablet TAKE 1/2 TABLET BY MOUTH AT BEDTIME AS NEEDED 05/17/16   [name], MD  VENTOLIN HFA 108 [phone] Base) MCG/ACT inhaler INHALE 2 PUFFS 4 TIMES A DAY 10/16/16   [name], MD  warfarin (COUMADIN) 5 MG tablet Take as directed by anticoagulation clinic 11/02/16   [name], MD  zolpidem (AMBIEN) 10 MG tablet TAKE 1 TABLET BY MOUTH AT BEDTIME 10/24/16   [name], MD    Family History Family History  Problem Relation Age of Onset  . COPD Mother   . Asthma Mother   . Colon polyps Mother   . Allergies Mother   . Hypothyroidism Mother   . Asthma Maternal Grandmother   . Colon cancer Neg Hx     Social History Social History  Substance Use Topics  . Smoking status: Former Smoker    Packs/day: 2.00    Years: 30.00    Types: Cigarettes    Quit date: 10/16/2007  . Smokeless tobacco: Never Used  . Alcohol use No     Allergies   Pravastatin sodium; Simvastatin;  Statins; Amiodarone; and Tape   Review of Systems Review of Systems All other systems negative except as documented in the  HPI. All pertinent positives and negatives as reviewed in the HPI.  Physical Exam Updated Vital Signs BP (!) 148/111 (BP Location: Right Arm) Comment: Simultaneous filing. User may not have seen previous data.  Pulse (!) 52 Comment: Simultaneous filing. User may not have seen previous data.  Temp 97.9 F (36.6 C) (Oral)   Resp 17 Comment: Simultaneous filing. User may not have seen previous data.  Ht 5\' 10"  (1.778 m)   Wt (!) 137 kg   SpO2 99% Comment: 3L  BMI 43.33 kg/m   Physical Exam  Constitutional: He is oriented to person, place, and time. He appears well-developed and well-nourished. No distress.  HENT:  Head: Normocephalic and atraumatic.  Mouth/Throat: Oropharynx is clear and moist.  Eyes: Pupils are equal, round, and reactive to light.  Neck: Normal range of motion. Neck supple.  Cardiovascular: Normal rate, regular rhythm and normal heart sounds.  Exam reveals no gallop and no friction rub.   No murmur heard. Pulmonary/Chest: Effort normal. No respiratory distress. He has wheezes. He has no rales.  Abdominal: Soft. Bowel sounds are normal. He exhibits no distension. There is no tenderness.  Musculoskeletal: He exhibits edema.  Neurological: He is alert and oriented to person, place, and time. He exhibits normal muscle tone. Coordination normal.  Skin: Skin is warm and dry. Capillary refill takes less than 2 seconds. No rash noted. No erythema.  Psychiatric: He has a normal mood and affect. His behavior is normal.  Nursing note and vitals reviewed.    ED Treatments / Results  Labs (all labs ordered are listed, but only abnormal results are displayed) Labs Reviewed  BASIC METABOLIC PANEL - Abnormal; Notable for the following:       Result Value   Calcium 8.8 (*)    All other components within normal limits  CBC - Abnormal; Notable for the  following:    Hemoglobin 12.5 (*)    All other components within normal limits  TROPONIN I  BRAIN NATRIURETIC PEPTIDE    EKG  EKG Interpretation  Date/Time:  Monday December 31 2016 11:27:46 EDT Ventricular Rate:  117 PR Interval:    QRS Duration: 174 QT Interval:  390 QTC Calculation: 544 R Axis:   -85 Text Interpretation:  Atrial fibrillation with rapid ventricular response Right bundle branch block Left anterior fascicular block  Bifascicular block  Since last tracing rate faster Abnormal ekg Confirmed by Carmin Muskrat  MD ([id number]) on 12/31/2016 11:40:01 AM       Radiology Dg Chest 2 View  Result Date: 12/31/2016 CLINICAL DATA:  Shortness of breath with lower extremity and upper extremity edema EXAM: CHEST  2 VIEW COMPARISON:  August 31, 2015 FINDINGS: There is a small right pleural effusion with patchy focal opacity in the posterior right base, felt to represent a small area of pneumonia. Lungs elsewhere are clear. There is cardiomegaly with pulmonary venous hypertension. No adenopathy. There is a loop recorder on the left. There is degenerative change in the thoracic spine. IMPRESSION: Underlying pulmonary vascular congestion. Small focus of infiltrate posterior right base with small right pleural effusion. Suspect localized pneumonia in this area. No evident adenopathy. Followup PA and lateral chest radiographs recommended in 3-4 weeks following trial of antibiotic therapy to ensure resolution and exclude underlying malignancy. Electronically Signed   By: Lowella Grip III M.D.   On: 12/31/2016 13:00    Procedures Procedures (including critical care time)  Medications Ordered in ED Medications  cefTRIAXone (ROCEPHIN) 1 g in dextrose 5 %  50 mL IVPB (not administered)  azithromycin (ZITHROMAX) 500 mg in dextrose 5 % 250 mL IVPB (not administered)     Initial Impression / Assessment and Plan / ED Course  I have reviewed the triage vital signs and the nursing  notes.  Pertinent labs & imaging results that were available during my care of the patient were reviewed by me and considered in my medical decision making (see chart for details).     Field, the patient will need admission for his worsening shortness of breath and chest pressure along with the fact that he has a pneumonia noted on chest x-ray, along with a history of COPD and asthma agent has an oxygen requirement here in the emergency department at 3 L.  .  The patient states that at home at night when he sleeps he will use oxygen at times  Final Clinical Impressions(s) / ED Diagnoses   Final diagnoses:  None    New Prescriptions New Prescriptions   No medications on file     Dalia Heading, PA-C 12/31/16 [place], MD 01/16/17 1157

## 2016-12-31 NOTE — Telephone Encounter (Signed)
Pt called and stated that he had to call EMS this morning b/c his heart rate was 170, shortness of breath (walking from one room to another room), dizziness, a little bit of chest pain. Instructed pt to send a remote transmission with his home monitor. Instructed pt that once we received remote transmission a Device Tech RN will call him back w/ results. Pt verbalized understanding.

## 2016-12-31 NOTE — ED Notes (Signed)
ED Provider at bedside. 

## 2016-12-31 NOTE — ED Triage Notes (Signed)
Pt endorses Shob since Friday that is getting progressively worse with chest tightness. Pt has hx of asthma, copd and CHF and is on blood thinners and diuretics. Pitting edema noted to legs bilaterally, breath sounds clear. Pt appears to be labored in breathing, VSS. Pt placed on 2L [place] in triage. Pt in a-fib and has hx of same.

## 2016-12-31 NOTE — ED Notes (Signed)
Pt has continued afib with RVR and chest pressure (unchanged which he attributes to CAP).  Spoke with 5W they request pt have his daily meds for rate control before coming up

## 2016-12-31 NOTE — Telephone Encounter (Signed)
Transmission still not received ([facility] has been delayed this AM).  Spoke with patient.  Advised patient that transmission has not been received, but that patient should be evaluated at the ED based on his symptoms.  Patient reports that EMS did an EKG around 0500 this morning and it showed his HR was 170bpm.  Patient reports symptoms are unchanged, including ShOB, dizziness, and intermittent chest pain.  Advised patient to call 911 for transport to the ED, but he states he cannot afford an ambulance.  Patient reports his daughter will drive him to the [facility] ED.  Advised patient that my recommendations are to call 911 and he verbalizes understanding.  Patient is appreciative of assistance and denies additional questions at this time.

## 2016-12-31 NOTE — Telephone Encounter (Signed)
[facility] transmission received and reviewed.  Presenting rhythm (as of 0900) is A-fib.  100% burden, avg V rate during AF trend stable per device.

## 2017-01-01 DIAGNOSIS — I10 Essential (primary) hypertension: Secondary | ICD-10-CM

## 2017-01-01 DIAGNOSIS — J181 Lobar pneumonia, unspecified organism: Secondary | ICD-10-CM

## 2017-01-01 DIAGNOSIS — I4891 Unspecified atrial fibrillation: Secondary | ICD-10-CM

## 2017-01-01 DIAGNOSIS — J439 Emphysema, unspecified: Secondary | ICD-10-CM

## 2017-01-01 DIAGNOSIS — J9621 Acute and chronic respiratory failure with hypoxia: Secondary | ICD-10-CM

## 2017-01-01 LAB — HIV ANTIBODY (ROUTINE TESTING W REFLEX): HIV Screen 4th Generation wRfx: NONREACTIVE

## 2017-01-01 LAB — BASIC METABOLIC PANEL
ANION GAP: 11 (ref 5–15)
BUN: 10 mg/dL (ref 6–20)
CHLORIDE: 105 mmol/L (ref 101–111)
CO2: 27 mmol/L (ref 22–32)
CREATININE: 1.02 mg/dL (ref 0.61–1.24)
Calcium: 9.1 mg/dL (ref 8.9–10.3)
GFR calc non Af Amer: >60 mL/min (ref >60)
Glucose, Bld: 119 mg/dL — ABNORMAL HIGH (ref 65–99)
Potassium: 3.8 mmol/L (ref 3.5–5.1)
SODIUM: 143 mmol/L (ref 135–145)

## 2017-01-01 LAB — CBC
HCT: 38.4 % — ABNORMAL LOW (ref 39.0–52.0)
HEMOGLOBIN: 12.2 g/dL — AB (ref 13.0–17.0)
MCH: 29.3 pg (ref 26.0–34.0)
MCHC: 31.8 g/dL (ref 30.0–36.0)
MCV: 92.3 fL (ref 78.0–100.0)
Platelets: 176 10*3/uL (ref 150–400)
RBC: 4.16 MIL/uL — AB (ref 4.22–5.81)
RDW: 15 % (ref 11.5–15.5)
WBC: 6.9 10*3/uL (ref 4.0–10.5)

## 2017-01-01 LAB — PROTIME-INR
INR: 2.04
Prothrombin Time: 23.4 seconds — ABNORMAL HIGH (ref 11.4–15.2)

## 2017-01-01 LAB — STREP PNEUMONIAE URINARY ANTIGEN: Strep Pneumo Urinary Antigen: NEGATIVE

## 2017-01-01 MED ORDER — ALUM & MAG HYDROXIDE-SIMETH 200-200-20 MG/5ML PO SUSP
30.0000 mL | ORAL | Status: DC | PRN
  Administered 2017-01-01 – 2017-01-06 (×5): 30 mL via ORAL
  Filled 2017-01-01 (×6): qty 30

## 2017-01-01 MED ORDER — ONDANSETRON HCL 4 MG PO TABS
8.0000 mg | ORAL_TABLET | Freq: Two times a day (BID) | ORAL | Status: DC | PRN
  Administered 2017-01-01 – 2017-01-07 (×4): 8 mg via ORAL
  Filled 2017-01-01 (×4): qty 2

## 2017-01-01 MED ORDER — WARFARIN SODIUM 5 MG PO TABS
5.0000 mg | ORAL_TABLET | Freq: Once | ORAL | Status: AC
  Administered 2017-01-01: 5 mg via ORAL
  Filled 2017-01-01: qty 1

## 2017-01-01 NOTE — Progress Notes (Signed)
HR is documented in chart as 48 reviewed strips and spoke with CMT and no evidence of the heart rate being that low. Will continue to monitor. Arthor Captain LPN

## 2017-01-01 NOTE — Telephone Encounter (Signed)
Advised patient, he will call pharm when needed

## 2017-01-01 NOTE — Progress Notes (Signed)
PROGRESS NOTE  [name]  QQV:[id number] DOB: [birth year] DOA: 12/31/2016 PCP: [name], MD   Brief Narrative: [name] is a 61 y.o. male with a history of diastolic CHF with last TEE in 2016 demonstrating EF 50-55%, asthma, COPD, chronic respiratory failure on intermittent use of nocturnal oxygen, atrial fibrillation - s/p DCCV in 2016, on coumadin, s/p PPM, HTN, HLD, BPH, and GERD who presented to the emergency department with complaints of progressive worsening of shortness of breath and weakness present for weeks worsening over previous 48 hours, having woken up feeling smothered. He called EMS, but refused transport due to cost, and later had cardiology remotely interrogate PPM. They found an accelerated heart rate 170 bpm and referred him to the ED for further evaluation. Upon arrival he was tachypneic with respirations of 22 with heart rate was 145 and EKG showed atopy with RBBB and left fascicular block, and nonspecific ST- T wave abnormality He was hypoxic, improved on 3 L O2. Blood work was unremarkable, CXR showed posterior right base infiltrate with small right pleural effusion suspicious for pneumonia superimposed on underlying pulmonary vascular congestion. The patient was admitted for acute on chronic respiratory failure secondary to CAP and decompensated CHF resulting in Afib w/ RVR.   Assessment & Plan: Principal Problem:   CAP (community acquired pneumonia) Active Problems:   Essential hypertension   GERD   Anticoagulated on warfarin   COPD (chronic obstructive pulmonary disease) (HCC)   Hyperlipidemia   Atrial fibrillation (HCC)   Acute on chronic respiratory failure (HCC)  Acute on chronic hypoxemic respiratory failure: Multifactorial, associated with CAP, CHF exacerbation superimposed on COPD. Patient uses nocturnal O2 at home intermittently. Currently O2 requirement increased to continuous O2 at 3L - Continue to treat underlying issues and wean off O2 as  tolerated  CAP: RLL with associated small effusion. Strep pneumo Ag negative, HIV NR. No leukocytosis - Continue antibiotic therapy, incentive spirometry, expectorant prn, monitor WBC count and temperature - Monitor blood cultures - Recommend follow up CXR to ensure resolution of infiltrate and effusion.   Acute on chronic CHF: Due to decreasing torsemide dose to 60mg  daily (instead of BID) at home due to frequent urination. EDW appears to be ~302lbs. 310lbs at admission. BNP 395. Tn neg.  - Holding torsemide and started lasix 80 mg IV BID with good reported UOP, though none is charted?? - Follow renal function, daily I/O and daily weights  Afib: Currently rate controlled. - Continue coumadin per pharmacy. INR 1.96 on admission. - Keep K > 4 - Continue nebivolol and diltiazem  GERD: Chronic, stable - Continue PPI  Hypertension: Chronic, stable - Continue home medication and adjust the doses if needed depending on the BP readings  Dermatitis: Pruritic, nonspecific. Has been biopsied by dermatology as outpatient. No disease-specific therapy was advised. No evidence of bacterial infection. - Topical treatments - Superficial cultures were ordered at admission  Hypothyroidism: most recent TSH 1.48 on 11/29/2016 - continue home synthroid  Muscle spasms: Reordered home medications.   DVT prophylaxis: Coumadin Code Status: Full Family Communication: None at bedside Disposition Plan: [place] with wife once improving.   Consultants:   None  Procedures:   None  Antimicrobials:  Azithromycin 3/19 >>   Ceftriaxone 3/19 >>   Subjective: Pt breathing only a little better than at admission, no chest pain or palpitations. Leg swelling stable. Reports urinating frequently, large volume. No other issues.   Objective: Vitals:   12/31/16 2146 01/01/17 0519 01/01/17  0545 01/01/17 0833  BP: 111/77 128/63    Pulse: 94 (!) 48 72   Resp: 20 20    Temp: 98 F (36.7  C) 98.2 F (36.8 C)    TempSrc:  Oral    SpO2: 96% 97%  98%  Weight:      Height:        Intake/Output Summary (Last 24 hours) at 01/01/17 1348 Last data filed at 12/31/16 1900  Gross per 24 hour  Intake              250 ml  Output                0 ml  Net              250 ml   Filed Weights   12/31/16 1133 12/31/16 2143  Weight: (!) 137 kg (302 lb) (!) 140.8 kg (310 lb 4.8 oz)    Examination: General exam: 61 y.o. male in no distress Respiratory system: Non-labored breathing 3L by [place]. Bibasilar crackles and diminished at right base.  Cardiovascular system: Irreg irreg. No murmur, rub, or gallop. No JVD, and 2+ pedal edema. Gastrointestinal system: Abdomen soft, non-tender, non-distended, with normoactive bowel sounds. No organomegaly or masses felt. Central nervous system: Alert and oriented. No focal neurological deficits. Extremities: Warm, no deformities Skin: Scattered nonconfluent erythematous-violaceous papules, some excoriated and draining serous fluid. No purulence, induration, or fluctuance. Psychiatry: Judgement and insight appear normal. Mood & affect appropriate.   Data Reviewed: I have personally reviewed following labs and imaging studies  CBC:  Recent Labs Lab 12/31/16 1136 01/01/17 0539  WBC 6.9 6.9  HGB 12.5* 12.2*  HCT 39.7 38.4*  MCV 93.0 92.3  PLT 159 998   Basic Metabolic Panel:  Recent Labs Lab 12/31/16 1136 01/01/17 0539  NA 140 143  K 4.4 3.8  CL 105 105  CO2 28 27  GLUCOSE 95 119*  BUN 7 10  CREATININE 0.78 1.02  CALCIUM 8.8* 9.1   GFR: Estimated Creatinine Clearance (by C-G formula based on SCr of 1.02 mg/dL) Male: 87.9 mL/min Male: 106.3 mL/min Liver Function Tests: No results for input(s): AST, ALT, ALKPHOS, BILITOT, PROT, ALBUMIN in the last 168 hours. No results for input(s): LIPASE, AMYLASE in the last 168 hours. No results for input(s): AMMONIA in the last 168 hours. Coagulation Profile:  Recent Labs Lab  12/31/16 1902 01/01/17 0539  INR 1.96 2.04   Cardiac Enzymes:  Recent Labs Lab 12/31/16 1136  TROPONINI <0.03   BNP (last 3 results) No results for input(s): PROBNP in the last 8760 hours. HbA1C: No results for input(s): HGBA1C in the last 72 hours. CBG: No results for input(s): GLUCAP in the last 168 hours. Lipid Profile: No results for input(s): CHOL, HDL, LDLCALC, TRIG, CHOLHDL, LDLDIRECT in the last 72 hours. Thyroid Function Tests: No results for input(s): TSH, T4TOTAL, FREET4, T3FREE, THYROIDAB in the last 72 hours. Anemia Panel: No results for input(s): VITAMINB12, FOLATE, FERRITIN, TIBC, IRON, RETICCTPCT in the last 72 hours. Urine analysis:    Component Value Date/Time   COLORURINE YELLOW 11/29/2016 [place] 11/29/2016 1518   LABSPEC 1.020 11/29/2016 1518   PHURINE 6.0 11/29/2016 1518   GLUCOSEU NEGATIVE 11/29/2016 1518   HGBUR SMALL (A) 11/29/2016 1518   BILIRUBINUR NEGATIVE 11/29/2016 1518   KETONESUR NEGATIVE 11/29/2016 1518   PROTEINUR NEGATIVE 06/07/2014 1031   UROBILINOGEN 0.2 11/29/2016 1518   NITRITE NEGATIVE 11/29/2016 1518   LEUKOCYTESUR NEGATIVE 11/29/2016  1518   Recent Results (from the past 240 hour(s))  Aerobic Culture (superficial specimen)     Status: None (Preliminary result)   Collection Time: 12/31/16 11:32 PM  Result Value Ref Range Status   Specimen Description BACK  Final   Special Requests Normal  Final   Gram Stain   Final    NO WBC SEEN RARE SQUAMOUS EPITHELIAL CELLS PRESENT NO ORGANISMS SEEN    Culture PENDING  Incomplete   Report Status PENDING  Incomplete      Radiology Studies: Dg Chest 2 View  Result Date: 12/31/2016 CLINICAL DATA:  Shortness of breath with lower extremity and upper extremity edema EXAM: CHEST  2 VIEW COMPARISON:  August 31, 2015 FINDINGS: There is a small right pleural effusion with patchy focal opacity in the posterior right base, felt to represent a small area of pneumonia. Lungs  elsewhere are clear. There is cardiomegaly with pulmonary venous hypertension. No adenopathy. There is a loop recorder on the left. There is degenerative change in the thoracic spine. IMPRESSION: Underlying pulmonary vascular congestion. Small focus of infiltrate posterior right base with small right pleural effusion. Suspect localized pneumonia in this area. No evident adenopathy. Followup PA and lateral chest radiographs recommended in 3-4 weeks following trial of antibiotic therapy to ensure resolution and exclude underlying malignancy. Electronically Signed   By: Lowella Grip III M.D.   On: 12/31/2016 13:00    Scheduled Meds: . azithromycin  500 mg Intravenous Q24H  . carisoprodol  350 mg Oral TID  . cefTRIAXone (ROCEPHIN)  IV  1 g Intravenous Q24H  . diltiazem  120 mg Oral BID  . doxepin  25 mg Oral BID  . fluticasone furoate-vilanterol  1 puff Inhalation Daily  . furosemide  80 mg Intravenous Q12H  . hydrocortisone  1 application Topical BID  . levothyroxine  25 mcg Oral QAC breakfast  . lidocaine  1 patch Transdermal Q24H  . mupirocin cream   Topical BID  . nebivolol  7.5 mg Oral Daily  . pantoprazole  40 mg Oral Daily  . potassium chloride SA  40 mEq Oral Daily  . sertraline  200 mg Oral QHS  . sodium chloride flush  3 mL Intravenous Q12H  . Warfarin - Pharmacist Dosing Inpatient   Does not apply q1800  . zolpidem  5 mg Oral QHS,MR X 1   Continuous Infusions:   LOS: 1 day   Time spent: 25 minutes.  Vance Gather, MD [facility] Pager [phone]  If 7PM-7AM, please contact night-coverage [email or web address] Password [email or web address] 01/01/2017, 1:48 PM

## 2017-01-01 NOTE — Progress Notes (Signed)
ANTICOAGULATION CONSULT NOTE - Follow-Up Consult  Pharmacy Consult for Warfarin Indication: atrial fibrillation  Allergies  Allergen Reactions  . Amiodarone Other (See Comments)    hyperthyroidism  . Pravastatin Sodium Other (See Comments)    myalgia  . Simvastatin Other (See Comments)    severe leg cramps  . Statins Other (See Comments)    myalgia  . Tape Other (See Comments)    Skin Tears Use Paper Tape Only    Patient Measurements: Height: 5\' 10"  (177.8 cm) Weight: (!) 310 lb 4.8 oz (140.8 kg) IBW/kg (Calculated) : 73  Vital Signs: Temp: 98.2 F (36.8 C) (03/20 0519) Temp Source: Oral (03/20 0519) BP: 128/63 (03/20 0519) Pulse Rate: 72 (03/20 0545)  Labs:  Recent Labs  12/31/16 1136 12/31/16 1902 01/01/17 0539  HGB 12.5*  --  12.2*  HCT 39.7  --  38.4*  PLT 159  --  176  LABPROT  --  22.6* 23.4*  INR  --  1.96 2.04  CREATININE 0.78  --  1.02  TROPONINI <0.03  --   --     Estimated Creatinine Clearance (by C-G formula based on SCr of 1.02 mg/dL) Male: 87.9 mL/min Male: 106.3 mL/min   Medical History: Past Medical History:  Diagnosis Date  . Anemia    supposed to be taking Vit B but doesn't  . ANXIETY    takes Xanax nightly  . Arthritis   . ASTHMA    Albuterol prn and Advair daily;also takes Prednisone daily  . Asthma   . Atrial fibrillation ([place]) 09/06/2015  . Cardiomyopathy [facility])    a. EF 25% TEE July 2013; b. EF normalized 2015;  c. 03/2015 Echo: EF 40-45%, difrf HK, PASP 38 mmHg, Mild MR, sev LAE/RAE.  [name] Chronic constipation    takes OTC stool softener  . COPD (chronic obstructive pulmonary disease) ([place])   . DEPRESSION    takes Zoloft and Doxepin daily  . Diverticulitis   . DYSKINESIA, ESOPHAGUS   . Essential hypertension       . FIBROMYALGIA   . GERD (gastroesophageal reflux disease)       . Glaucoma   . HYPERLIPIDEMIA    a. Intolerant to statins.  . INSOMNIA    takes Ambien nightly  . Myocardial infarction    a. 2012 Myoview  notable for prior infarct;  b. 03/2015 Lexiscan CL: EF 37%, diff HK, small area of inferior infarct from apex to base-->Med Rx.  . Myocardial infarction   . O2 dependent    uses oxygen 2 and 1/2 liters per [place] at hs  . Paroxysmal atrial fibrillation (HCC)    a. CHA2DS2VASc = 3--> takes Coumadin;  b. 03/15/2015 Successful TEE/DCCV;  c. 03/2015 recurrent afib, Amio d/c'd in setting of hyperthyroidism.  . Peripheral neuropathy ([place])   . Rash and other nonspecific skin eruption 04/12/2009   no cause found saw dermatologists x 2 and allergist  . SLEEP APNEA, OBSTRUCTIVE    a. doesn't use CPAP  . Syncope    a. 03/2015 s/p MDT LINQ.  [name] Type II diabetes mellitus (HCC)         Medications:  Prescriptions Prior to Admission  Medication Sig Dispense Refill Last Dose  . ADVAIR DISKUS 250-50 MCG/DOSE AEPB INHALE 1 PUFF EVERY DAY AS DIRECTED 60 each 3 12/30/2016 at Unknown time  . albuterol (PROVENTIL HFA;VENTOLIN HFA) 108 (90 BASE) MCG/ACT inhaler Inhale 2 puffs into the lungs 4 (four) times daily. Also may Inhale 2 puffs into the  lungs 4 times a day as needed for wheezing   rescue at rescue  . ALPRAZolam (XANAX) 0.5 MG tablet 1-2 tab by mouth at bedtime as needed for sleep 180 tablet 1 Past Week at Unknown time  . BYSTOLIC 5 MG tablet TAKE 1 AND 1/2 TABLETS BY MOUTH EVERY DAY 45 tablet 2 12/30/2016 at Unknown time  . carisoprodol (SOMA) 350 MG tablet TAKE 1 TABLET BY MOUTH 3 TIMES A DAY AS NEEDED FOR MUSCLE SPASMS 90 tablet 5 Past Week at Unknown time  . diltiazem (CARDIZEM CD) 120 MG 24 hr capsule Take 1 capsule (120 mg total) by mouth 2 (two) times daily. 180 capsule 2 12/30/2016 at Unknown time  . diphenhydrAMINE (BENADRYL) 25 MG tablet Take 25 mg by mouth every 6 (six) hours as needed for itching.   Past Week at Unknown time  . doxepin (SINEQUAN) 25 MG capsule TAKE ONE CAPSULE BY MOUTH TWICE A DAY 180 capsule 1 12/30/2016 at Unknown time  . fluocinonide cream (LIDEX) 3.81 % Apply 1 application topically 2  (two) times daily. 30 g 0 12/30/2016 at Unknown time  . hydrocortisone 2.5 % cream Apply 1 application topically 2 (two) times daily. itching   12/30/2016 at Unknown time  . hydrOXYzine (ATARAX/VISTARIL) 25 MG tablet Take 25 mg by mouth daily as needed for itching.   12/30/2016 at Unknown time  . ibuprofen (ADVIL,MOTRIN) 200 MG tablet Take 400 mg by mouth 2 (two) times daily.   12/30/2016 at Unknown time  . levothyroxine (SYNTHROID, LEVOTHROID) 25 MCG tablet TAKE 1 TABLET BY MOUTH EVERY DAY BEFORE BREAKFAST 90 tablet 2 12/30/2016 at Unknown time  . lidocaine (LIDODERM) 5 % Apply 1 patch every 12 hrs to affected area. Do not use more than one patch in 24 hrs. 100 patch 1 12/30/2016 at Unknown time  . Linaclotide (LINZESS) 145 MCG CAPS capsule Take 145 mcg by mouth daily as needed (Crampy pain).    Past Week at Unknown time  . omeprazole (PRILOSEC) 20 MG capsule Take 1 capsule (20 mg total) by mouth daily with supper. 90 capsule 1 12/30/2016 at Unknown time  . potassium chloride SA (K-DUR,KLOR-CON) 20 MEQ tablet Take 40 mEq by mouth daily.   12/30/2016 at Unknown time  . sertraline (ZOLOFT) 100 MG tablet TAKE 2 TABLETS BY MOUTH AT BEDTIME 180 tablet 3 12/30/2016 at Unknown time  . silver sulfADIAZINE (SILVADENE) 1 % cream APPLY TO AFFECTED AREA TOPICALLY EVERY DAY 50 g 3 12/30/2016 at Unknown time  . torsemide (DEMADEX) 20 MG tablet Take 3 tablets (60 mg total) by mouth 2 (two) times daily. 180 tablet 3 12/30/2016 at Unknown time  . traZODone (DESYREL) 100 MG tablet TAKE 1/2 TABLET BY MOUTH AT BEDTIME AS NEEDED 45 tablet 1 Past Week at Unknown time  . TUMS 500 MG chewable tablet Chew 500 mg by mouth daily as needed.   Past Week at Unknown time  . warfarin (COUMADIN) 5 MG tablet Take as directed by anticoagulation clinic (Patient taking differently: Take 2.5-5 mg by mouth See admin instructions. 2.5mg  on Mondays and Fridays, 5mg  all other days) 30 tablet 3 12/30/2016 at 1800  . zolpidem (AMBIEN) 10 MG tablet TAKE 1  TABLET BY MOUTH AT BEDTIME 30 tablet 5 12/30/2016 at Unknown time   Scheduled:  . azithromycin  500 mg Intravenous Q24H  . carisoprodol  350 mg Oral TID  . cefTRIAXone (ROCEPHIN)  IV  1 g Intravenous Q24H  . diltiazem  120 mg Oral BID  .  doxepin  25 mg Oral BID  . fluticasone furoate-vilanterol  1 puff Inhalation Daily  . furosemide  80 mg Intravenous Q12H  . hydrocortisone  1 application Topical BID  . levothyroxine  25 mcg Oral QAC breakfast  . lidocaine  1 patch Transdermal Q24H  . mupirocin cream   Topical BID  . nebivolol  7.5 mg Oral Daily  . pantoprazole  40 mg Oral Daily  . potassium chloride SA  40 mEq Oral Daily  . sertraline  200 mg Oral QHS  . sodium chloride flush  3 mL Intravenous Q12H  . Warfarin - Pharmacist Dosing Inpatient   Does not apply q1800  . zolpidem  5 mg Oral QHS,MR X 1   Infusions:   PRN: acetaminophen **OR** acetaminophen, albuterol, alum & mag hydroxide-simeth, hydrALAZINE, linaclotide, traZODone  Assessment: Patient is a 61 yo M admitted 3/19 w/ worsening shortness of breath and weakness. Patient is on warfarin PTA for a fib. Pharmacy was consulted to dose while inpatient.   PTA warfarin dose: 2.5mg  Mon and Fri, 5mg  all other days per patient discussion and chart review  INR therapeutic.  Will resume home dose.  Goal of Therapy:  INR 2-3 Monitor platelets by anticoagulation protocol: Yes   Plan:  Warfarin 5mg  PO x1 tonight Daily INR Monitor signs/symptoms of bleeding  Zauria Dombek, Pharm.D., BCPS Clinical Pharmacist Pager [phone] 01/01/2017 2:20 PM

## 2017-01-02 ENCOUNTER — Encounter (HOSPITAL_COMMUNITY): Payer: Self-pay | Admitting: General Practice

## 2017-01-02 LAB — BASIC METABOLIC PANEL
ANION GAP: 10 (ref 5–15)
BUN: 17 mg/dL (ref 6–20)
CO2: 28 mmol/L (ref 22–32)
Calcium: 8.4 mg/dL — ABNORMAL LOW (ref 8.9–10.3)
Chloride: 100 mmol/L — ABNORMAL LOW (ref 101–111)
Creatinine, Ser: 1.11 mg/dL (ref 0.61–1.24)
GFR calc Af Amer: >60 mL/min (ref >60)
Glucose, Bld: 153 mg/dL — ABNORMAL HIGH (ref 65–99)
POTASSIUM: 3.8 mmol/L (ref 3.5–5.1)
SODIUM: 138 mmol/L (ref 135–145)

## 2017-01-02 LAB — PROTIME-INR
INR: 2.49
Prothrombin Time: 27.4 seconds — ABNORMAL HIGH (ref 11.4–15.2)

## 2017-01-02 MED ORDER — DEXTROSE 5 % IV SOLN
500.0000 mg | INTRAVENOUS | Status: DC
  Administered 2017-01-02: 500 mg via INTRAVENOUS
  Filled 2017-01-02 (×2): qty 500

## 2017-01-02 MED ORDER — TAMSULOSIN HCL 0.4 MG PO CAPS
0.4000 mg | ORAL_CAPSULE | Freq: Every day | ORAL | Status: DC
  Administered 2017-01-02 – 2017-01-06 (×5): 0.4 mg via ORAL
  Filled 2017-01-02 (×5): qty 1

## 2017-01-02 MED ORDER — WARFARIN SODIUM 2.5 MG PO TABS
2.5000 mg | ORAL_TABLET | Freq: Once | ORAL | Status: AC
  Administered 2017-01-02: 2.5 mg via ORAL
  Filled 2017-01-02: qty 1

## 2017-01-02 MED ORDER — FUROSEMIDE 10 MG/ML IJ SOLN
120.0000 mg | Freq: Two times a day (BID) | INTRAVENOUS | Status: DC
  Filled 2017-01-02 (×2): qty 12

## 2017-01-02 MED ORDER — FUROSEMIDE 10 MG/ML IJ SOLN
120.0000 mg | Freq: Two times a day (BID) | INTRAMUSCULAR | Status: DC
  Administered 2017-01-02 – 2017-01-03 (×2): 120 mg via INTRAVENOUS
  Filled 2017-01-02 (×2): qty 12

## 2017-01-02 NOTE — Progress Notes (Signed)
ANTICOAGULATION CONSULT NOTE - Follow-Up Consult  Pharmacy Consult for Warfarin Indication: atrial fibrillation  Allergies  Allergen Reactions  . Amiodarone Other (See Comments)    hyperthyroidism  . Pravastatin Sodium Other (See Comments)    myalgia  . Simvastatin Other (See Comments)    severe leg cramps  . Statins Other (See Comments)    myalgia  . Tape Other (See Comments)    Skin Tears Use Paper Tape Only    Patient Measurements: Height: 5\' 10"  (177.8 cm) Weight: (!) 312 lb 14.4 oz (141.9 kg) IBW/kg (Calculated) : 73  Vital Signs: Temp: 97.5 F (36.4 C) (03/21 0639) Temp Source: Oral (03/21 0639) BP: 108/69 (03/21 0639) Pulse Rate: 70 (03/21 0639)  Labs:  Recent Labs  12/31/16 1136 12/31/16 1902 01/01/17 0539 01/02/17 1001  HGB 12.5*  --  12.2*  --   HCT 39.7  --  38.4*  --   PLT 159  --  176  --   LABPROT  --  22.6* 23.4* 27.4*  INR  --  1.96 2.04 2.49  CREATININE 0.78  --  1.02 1.11  TROPONINI <0.03  --   --   --     Estimated Creatinine Clearance (by C-G formula based on SCr of 1.11 mg/dL) Male: 81.2 mL/min Male: 98.2 mL/min   Medical History: Past Medical History:  Diagnosis Date  . Anemia    supposed to be taking Vit B but doesn't  . ANXIETY    takes Xanax nightly  . Arthritis   . ASTHMA    Albuterol prn and Advair daily;also takes Prednisone daily  . Asthma   . Atrial fibrillation ([place]) 09/06/2015  . Cardiomyopathy [facility])    a. EF 25% TEE July 2013; b. EF normalized 2015;  c. 03/2015 Echo: EF 40-45%, difrf HK, PASP 38 mmHg, Mild MR, sev LAE/RAE.  [name] Chronic constipation    takes OTC stool softener  . COPD (chronic obstructive pulmonary disease) ([place])   . DEPRESSION    takes Zoloft and Doxepin daily  . Diverticulitis   . DYSKINESIA, ESOPHAGUS   . Essential hypertension       . FIBROMYALGIA   . GERD (gastroesophageal reflux disease)       . Glaucoma   . HYPERLIPIDEMIA    a. Intolerant to statins.  . INSOMNIA    takes Ambien  nightly  . Myocardial infarction    a. 2012 Myoview notable for prior infarct;  b. 03/2015 Lexiscan CL: EF 37%, diff HK, small area of inferior infarct from apex to base-->Med Rx.  . Myocardial infarction   . O2 dependent    uses oxygen 2 and 1/2 liters per [place] at hs  . Paroxysmal atrial fibrillation (HCC)    a. CHA2DS2VASc = 3--> takes Coumadin;  b. 03/15/2015 Successful TEE/DCCV;  c. 03/2015 recurrent afib, Amio d/c'd in setting of hyperthyroidism.  . Peripheral neuropathy ([place])   . Pneumonia 12/2016  . Rash and other nonspecific skin eruption 04/12/2009   no cause found saw dermatologists x 2 and allergist  . SLEEP APNEA, OBSTRUCTIVE    a. doesn't use CPAP  . Syncope    a. 03/2015 s/p MDT LINQ.  [name] Type II diabetes mellitus (HCC)         Medications:  Prescriptions Prior to Admission  Medication Sig Dispense Refill Last Dose  . ADVAIR DISKUS 250-50 MCG/DOSE AEPB INHALE 1 PUFF EVERY DAY AS DIRECTED 60 each 3 12/30/2016 at Unknown time  . albuterol (PROVENTIL HFA;VENTOLIN HFA) 108 (  90 BASE) MCG/ACT inhaler Inhale 2 puffs into the lungs 4 (four) times daily. Also may Inhale 2 puffs into the lungs 4 times a day as needed for wheezing   rescue at rescue  . ALPRAZolam (XANAX) 0.5 MG tablet 1-2 tab by mouth at bedtime as needed for sleep 180 tablet 1 Past Week at Unknown time  . BYSTOLIC 5 MG tablet TAKE 1 AND 1/2 TABLETS BY MOUTH EVERY DAY 45 tablet 2 12/30/2016 at Unknown time  . carisoprodol (SOMA) 350 MG tablet TAKE 1 TABLET BY MOUTH 3 TIMES A DAY AS NEEDED FOR MUSCLE SPASMS 90 tablet 5 Past Week at Unknown time  . diltiazem (CARDIZEM CD) 120 MG 24 hr capsule Take 1 capsule (120 mg total) by mouth 2 (two) times daily. 180 capsule 2 12/30/2016 at Unknown time  . diphenhydrAMINE (BENADRYL) 25 MG tablet Take 25 mg by mouth every 6 (six) hours as needed for itching.   Past Week at Unknown time  . doxepin (SINEQUAN) 25 MG capsule TAKE ONE CAPSULE BY MOUTH TWICE A DAY 180 capsule 1 12/30/2016 at Unknown  time  . fluocinonide cream (LIDEX) 7.56 % Apply 1 application topically 2 (two) times daily. 30 g 0 12/30/2016 at Unknown time  . hydrocortisone 2.5 % cream Apply 1 application topically 2 (two) times daily. itching   12/30/2016 at Unknown time  . hydrOXYzine (ATARAX/VISTARIL) 25 MG tablet Take 25 mg by mouth daily as needed for itching.   12/30/2016 at Unknown time  . ibuprofen (ADVIL,MOTRIN) 200 MG tablet Take 400 mg by mouth 2 (two) times daily.   12/30/2016 at Unknown time  . levothyroxine (SYNTHROID, LEVOTHROID) 25 MCG tablet TAKE 1 TABLET BY MOUTH EVERY DAY BEFORE BREAKFAST 90 tablet 2 12/30/2016 at Unknown time  . lidocaine (LIDODERM) 5 % Apply 1 patch every 12 hrs to affected area. Do not use more than one patch in 24 hrs. 100 patch 1 12/30/2016 at Unknown time  . Linaclotide (LINZESS) 145 MCG CAPS capsule Take 145 mcg by mouth daily as needed (Crampy pain).    Past Week at Unknown time  . omeprazole (PRILOSEC) 20 MG capsule Take 1 capsule (20 mg total) by mouth daily with supper. 90 capsule 1 12/30/2016 at Unknown time  . potassium chloride SA (K-DUR,KLOR-CON) 20 MEQ tablet Take 40 mEq by mouth daily.   12/30/2016 at Unknown time  . sertraline (ZOLOFT) 100 MG tablet TAKE 2 TABLETS BY MOUTH AT BEDTIME 180 tablet 3 12/30/2016 at Unknown time  . silver sulfADIAZINE (SILVADENE) 1 % cream APPLY TO AFFECTED AREA TOPICALLY EVERY DAY 50 g 3 12/30/2016 at Unknown time  . torsemide (DEMADEX) 20 MG tablet Take 3 tablets (60 mg total) by mouth 2 (two) times daily. 180 tablet 3 12/30/2016 at Unknown time  . traZODone (DESYREL) 100 MG tablet TAKE 1/2 TABLET BY MOUTH AT BEDTIME AS NEEDED 45 tablet 1 Past Week at Unknown time  . TUMS 500 MG chewable tablet Chew 500 mg by mouth daily as needed.   Past Week at Unknown time  . warfarin (COUMADIN) 5 MG tablet Take as directed by anticoagulation clinic (Patient taking differently: Take 2.5-5 mg by mouth See admin instructions. 2.5mg  on Mondays and Fridays, 5mg  all other  days) 30 tablet 3 12/30/2016 at 1800  . zolpidem (AMBIEN) 10 MG tablet TAKE 1 TABLET BY MOUTH AT BEDTIME 30 tablet 5 12/30/2016 at Unknown time   Scheduled:  . azithromycin  500 mg Intravenous Q24H  . carisoprodol  350 mg Oral  TID  . cefTRIAXone (ROCEPHIN)  IV  1 g Intravenous Q24H  . diltiazem  120 mg Oral BID  . doxepin  25 mg Oral BID  . fluticasone furoate-vilanterol  1 puff Inhalation Daily  . furosemide  80 mg Intravenous Q12H  . hydrocortisone  1 application Topical BID  . levothyroxine  25 mcg Oral QAC breakfast  . lidocaine  1 patch Transdermal Q24H  . mupirocin cream   Topical BID  . nebivolol  7.5 mg Oral Daily  . pantoprazole  40 mg Oral Daily  . potassium chloride SA  40 mEq Oral Daily  . sertraline  200 mg Oral QHS  . sodium chloride flush  3 mL Intravenous Q12H  . Warfarin - Pharmacist Dosing Inpatient   Does not apply q1800  . zolpidem  5 mg Oral QHS,MR X 1   Infusions:   PRN: acetaminophen **OR** acetaminophen, albuterol, alum & mag hydroxide-simeth, hydrALAZINE, linaclotide, ondansetron, traZODone  Assessment: Patient is a 61 yo M admitted 3/19 w/ worsening shortness of breath and weakness. Patient is on warfarin PTA for a fib. Pharmacy was consulted to dose while inpatient.   PTA warfarin dose: 2.5mg  Mon and Fri, 5mg  all other days per patient discussion and chart review  INR therapeutic and trending up after increased dose and/ or related to DDI with antibiotics.  Goal of Therapy:  INR 2-3 Monitor platelets by anticoagulation protocol: Yes   Plan:  Warfarin 2.5mg  PO x1 tonight Daily INR Monitor signs/symptoms of bleeding  Corah Willeford, Pharm.D., BCPS Clinical Pharmacist Pager [phone] 01/02/2017 1:42 PM

## 2017-01-02 NOTE — Progress Notes (Signed)
PROGRESS NOTE  [name]  PNT:[id number] DOB: May 06, 1956 DOA: 12/31/2016 PCP: [name], MD   Brief Narrative: [name] is a 61 y.o. male with a history of diastolic CHF with last TEE in 2016 demonstrating EF 50-55%, asthma, COPD, chronic respiratory failure on intermittent use of nocturnal oxygen, atrial fibrillation - s/p DCCV in 2016, on coumadin, s/p PPM, HTN, HLD, BPH, and GERD who presented to the emergency department with complaints of progressive worsening of shortness of breath and weakness present for weeks worsening over previous 48 hours, having woken up feeling smothered. He called EMS, but refused transport due to cost, and later had cardiology remotely interrogate PPM. They found an accelerated heart rate 170 bpm and referred him to the ED for further evaluation. Upon arrival he was tachypneic with respirations of 22 with heart rate was 145 and EKG showed atopy with RBBB and left fascicular block, and nonspecific ST- T wave abnormality He was hypoxic, improved on 3 L O2. Blood work was unremarkable, CXR showed posterior right base infiltrate with small right pleural effusion suspicious for pneumonia superimposed on underlying pulmonary vascular congestion. The patient was admitted for acute on chronic respiratory failure secondary to CAP and decompensated CHF resulting in Afib w/ RVR. Rate has been well-controlled. Lasix 80mg  IV BID was started with limited diuresis, so this was augmented.   Assessment & Plan: Principal Problem:   CAP (community acquired pneumonia) Active Problems:   Essential hypertension   GERD   Anticoagulated on warfarin   COPD (chronic obstructive pulmonary disease) (HCC)   Hyperlipidemia   Atrial fibrillation (HCC)   Acute on chronic respiratory failure (HCC)  Acute on chronic hypoxemic respiratory failure: Multifactorial, associated with CAP, CHF exacerbation superimposed on COPD. Patient uses nocturnal O2 at home intermittently. Currently O2  requirement increased to continuous O2 at 3L - Continue to treat underlying issues and wean off O2 as tolerated, has been off oxygen as of 3/21.  CAP: RLL with associated small effusion. Strep pneumo Ag negative, HIV NR. No leukocytosis or fever. - Continue antibiotic therapy, incentive spirometry, expectorant prn - Monitor blood cultures - Recommend follow up CXR as outpatient to ensure resolution of infiltrate and effusion.   Acute on chronic CHF: Due to decreasing torsemide dose to 60mg  daily (instead of BID) at home due to frequent urination. EDW appears to be ~302lbs. 310lbs at admission. BNP 395. Tn neg.  - Holding torsemide and started lasix 80 mg IV BID with poorly charted UOP and weight not budging. Will augment to 120mg  BID - Follow renal function, daily I/O and daily weights.  Afib: Currently rate controlled. - Continue coumadin per pharmacy. INR 1.96 on admission. - Keep K > 4, repleting daily - Continue nebivolol and diltiazem  GERD: Chronic, stable - Continue PPI  Hypertension: Chronic, stable - Continue home medication and adjust the doses if needed depending on the BP readings  Dermatitis: Pruritic, nonspecific. Has been biopsied by dermatology as outpatient. No disease-specific therapy was advised. No evidence of bacterial infection. - Topical treatments - Superficial cultures were ordered at admission, pending.  Hypothyroidism: most recent TSH 1.48 on 11/29/2016 - Continue home synthroid  Muscle spasms: Reordered home medications. Stable.  BPH: Stable, no acute retention - Restart flomax  DVT prophylaxis: Coumadin Code Status: Full Family Communication: Girlfriend at bedside Disposition Plan: [place] home with girlfriend once improving. Still requires IV lasix.    Consultants:   None  Procedures:   None  Antimicrobials:  Azithromycin 3/19 >>  Ceftriaxone 3/19 >>   Subjective: Pt breathing only a little better than at admission by  report, though is not on oxygen today. Denies chest pain or palpitations. Leg swelling stable, above baseline. Has weak stream of urine which he's had in the past improved with flomax.   Objective: Vitals:   01/01/17 2151 01/02/17 0639 01/02/17 0929 01/02/17 1408  BP: 106/72 108/69  97/69  Pulse: (!) 50 70  87  Resp: 18 18  19   Temp: 98.5 F (36.9 C) 97.5 F (36.4 C)  97.8 F (36.6 C)  TempSrc:  Oral  Oral  SpO2: 94% 91% 94% 93%  Weight:  (!) 141.9 kg (312 lb 14.4 oz)    Height:        Intake/Output Summary (Last 24 hours) at 01/02/17 1704 Last data filed at 01/02/17 1252  Gross per 24 hour  Intake              660 ml  Output                0 ml  Net              660 ml   Filed Weights   12/31/16 1133 12/31/16 2143 01/02/17 0639  Weight: (!) 137 kg (302 lb) (!) 140.8 kg (310 lb 4.8 oz) (!) 141.9 kg (312 lb 14.4 oz)    Examination: General exam: 61 y.o. male in no distress Respiratory system: Non-labored breathing on room air, though has difficulty carrying on long sentences. Bibasilar crackles and diminished at right base.  Cardiovascular system: Irreg irreg. No murmur, rub, or gallop. No JVD, and 2+ pedal edema unchanged. Gastrointestinal system: Abdomen soft, non-tender, non-distended, with normoactive bowel sounds. No organomegaly or masses felt. Central nervous system: Alert and oriented. No focal neurological deficits. Extremities: Warm, no deformities Skin: Scattered nonconfluent erythematous-violaceous papules, some excoriated and draining serous fluid. No purulence, induration, or fluctuance. Psychiatry: Judgement and insight appear normal. Mood & affect appropriate.   Data Reviewed: I have personally reviewed following labs and imaging studies  CBC:  Recent Labs Lab 12/31/16 1136 01/01/17 0539  WBC 6.9 6.9  HGB 12.5* 12.2*  HCT 39.7 38.4*  MCV 93.0 92.3  PLT 159 102   Basic Metabolic Panel:  Recent Labs Lab 12/31/16 1136 01/01/17 0539 01/02/17 1001   NA 140 143 138  K 4.4 3.8 3.8  CL 105 105 100*  CO2 28 27 28   GLUCOSE 95 119* 153*  BUN 7 10 17   CREATININE 0.78 1.02 1.11  CALCIUM 8.8* 9.1 8.4*   GFR: Estimated Creatinine Clearance (by C-G formula based on SCr of 1.11 mg/dL) Male: 81.2 mL/min Male: 98.2 mL/min Liver Function Tests: No results for input(s): AST, ALT, ALKPHOS, BILITOT, PROT, ALBUMIN in the last 168 hours. No results for input(s): LIPASE, AMYLASE in the last 168 hours. No results for input(s): AMMONIA in the last 168 hours. Coagulation Profile:  Recent Labs Lab 12/31/16 1902 01/01/17 0539 01/02/17 1001  INR 1.96 2.04 2.49   Cardiac Enzymes:  Recent Labs Lab 12/31/16 1136  TROPONINI <0.03   BNP (last 3 results) No results for input(s): PROBNP in the last 8760 hours. HbA1C: No results for input(s): HGBA1C in the last 72 hours. CBG: No results for input(s): GLUCAP in the last 168 hours. Lipid Profile: No results for input(s): CHOL, HDL, LDLCALC, TRIG, CHOLHDL, LDLDIRECT in the last 72 hours. Thyroid Function Tests: No results for input(s): TSH, T4TOTAL, FREET4, T3FREE, THYROIDAB in the last 72 hours.  Anemia Panel: No results for input(s): VITAMINB12, FOLATE, FERRITIN, TIBC, IRON, RETICCTPCT in the last 72 hours. Urine analysis:    Component Value Date/Time   COLORURINE YELLOW 11/29/2016 1518   APPEARANCEUR CLEAR 11/29/2016 1518   LABSPEC 1.020 11/29/2016 1518   PHURINE 6.0 11/29/2016 1518   GLUCOSEU NEGATIVE 11/29/2016 1518   HGBUR SMALL (A) 11/29/2016 1518   BILIRUBINUR NEGATIVE 11/29/2016 1518   KETONESUR NEGATIVE 11/29/2016 1518   PROTEINUR NEGATIVE 06/07/2014 1031   UROBILINOGEN 0.2 11/29/2016 1518   NITRITE NEGATIVE 11/29/2016 1518   LEUKOCYTESUR NEGATIVE 11/29/2016 1518   Recent Results (from the past 240 hour(s))  Culture, blood (routine x 2) Call MD if unable to obtain prior to antibiotics being given     Status: None (Preliminary result)   Collection Time: 12/31/16  6:58 PM    Result Value Ref Range Status   Specimen Description BLOOD RIGHT ANTECUBITAL  Final   Special Requests BOTTLES DRAWN AEROBIC ONLY 5CC  Final   Culture NO GROWTH 2 DAYS  Final   Report Status PENDING  Incomplete  Culture, blood (routine x 2) Call MD if unable to obtain prior to antibiotics being given     Status: None (Preliminary result)   Collection Time: 12/31/16  7:07 PM  Result Value Ref Range Status   Specimen Description BLOOD LEFT ANTECUBITAL  Final   Special Requests BOTTLES DRAWN AEROBIC AND ANAEROBIC 5CC  Final   Culture NO GROWTH 2 DAYS  Final   Report Status PENDING  Incomplete  Aerobic Culture (superficial specimen)     Status: None (Preliminary result)   Collection Time: 12/31/16 11:32 PM  Result Value Ref Range Status   Specimen Description BACK  Final   Special Requests Normal  Final   Gram Stain   Final    NO WBC SEEN RARE SQUAMOUS EPITHELIAL CELLS PRESENT NO ORGANISMS SEEN    Culture   Final    FEW STAPHYLOCOCCUS AUREUS SUSCEPTIBILITIES TO FOLLOW    Report Status PENDING  Incomplete      Radiology Studies: No results found.  Scheduled Meds: . azithromycin  500 mg Intravenous Q24H  . carisoprodol  350 mg Oral TID  . cefTRIAXone (ROCEPHIN)  IV  1 g Intravenous Q24H  . diltiazem  120 mg Oral BID  . doxepin  25 mg Oral BID  . fluticasone furoate-vilanterol  1 puff Inhalation Daily  . furosemide  120 mg Intravenous Q12H  . hydrocortisone  1 application Topical BID  . levothyroxine  25 mcg Oral QAC breakfast  . lidocaine  1 patch Transdermal Q24H  . mupirocin cream   Topical BID  . nebivolol  7.5 mg Oral Daily  . pantoprazole  40 mg Oral Daily  . potassium chloride SA  40 mEq Oral Daily  . sertraline  200 mg Oral QHS  . sodium chloride flush  3 mL Intravenous Q12H  . tamsulosin  0.4 mg Oral QPC breakfast  . warfarin  2.5 mg Oral ONCE-1800  . Warfarin - Pharmacist Dosing Inpatient   Does not apply q1800  . zolpidem  5 mg Oral QHS,MR X 1   Continuous  Infusions:   LOS: 2 days   Time spent: 25 minutes.  Vance Gather, MD [facility] Pager [phone]  If 7PM-7AM, please contact night-coverage [email or web address] Password [facility] 01/02/2017, 5:04 PM

## 2017-01-03 DIAGNOSIS — I429 Cardiomyopathy, unspecified: Secondary | ICD-10-CM

## 2017-01-03 LAB — AEROBIC CULTURE W GRAM STAIN (SUPERFICIAL SPECIMEN): Gram Stain: NONE SEEN

## 2017-01-03 LAB — BASIC METABOLIC PANEL
Anion gap: 11 (ref 5–15)
BUN: 23 mg/dL — AB (ref 6–20)
CO2: 26 mmol/L (ref 22–32)
Calcium: 8.7 mg/dL — ABNORMAL LOW (ref 8.9–10.3)
Chloride: 97 mmol/L — ABNORMAL LOW (ref 101–111)
Creatinine, Ser: 1.19 mg/dL (ref 0.61–1.24)
GFR calc non Af Amer: >60 mL/min (ref >60)
GLUCOSE: 94 mg/dL (ref 65–99)
POTASSIUM: 4.5 mmol/L (ref 3.5–5.1)
SODIUM: 134 mmol/L — AB (ref 135–145)

## 2017-01-03 LAB — CUP PACEART REMOTE DEVICE CHECK
Date Time Interrogation Session: 20180314200625
MDC IDC PG IMPLANT DT: 20160622

## 2017-01-03 LAB — PROTIME-INR
INR: 2.62
Prothrombin Time: 28.5 seconds — ABNORMAL HIGH (ref 11.4–15.2)

## 2017-01-03 LAB — AEROBIC CULTURE  (SUPERFICIAL SPECIMEN): SPECIAL REQUESTS: NORMAL

## 2017-01-03 MED ORDER — FUROSEMIDE 10 MG/ML IJ SOLN
160.0000 mg | Freq: Two times a day (BID) | INTRAMUSCULAR | Status: DC
  Administered 2017-01-03 – 2017-01-06 (×6): 160 mg via INTRAVENOUS
  Filled 2017-01-03 (×7): qty 16

## 2017-01-03 MED ORDER — DILTIAZEM HCL ER COATED BEADS 120 MG PO CP24
120.0000 mg | ORAL_CAPSULE | Freq: Every morning | ORAL | Status: DC
  Filled 2017-01-03: qty 1

## 2017-01-03 MED ORDER — FUROSEMIDE 10 MG/ML IJ SOLN: 160.0000 mg | Freq: Two times a day (BID) | INTRAVENOUS | Status: DC

## 2017-01-03 MED ORDER — METOLAZONE 5 MG PO TABS
5.0000 mg | ORAL_TABLET | Freq: Every day | ORAL | Status: DC
  Administered 2017-01-04 – 2017-01-05 (×2): 5 mg via ORAL
  Filled 2017-01-03 (×2): qty 1

## 2017-01-03 MED ORDER — WARFARIN SODIUM 2.5 MG PO TABS
2.5000 mg | ORAL_TABLET | Freq: Once | ORAL | Status: AC
Start: 2017-01-03 — End: 2017-01-03
  Administered 2017-01-03: 2.5 mg via ORAL
  Filled 2017-01-03: qty 1

## 2017-01-03 MED ORDER — LEVOFLOXACIN 750 MG PO TABS
750.0000 mg | ORAL_TABLET | Freq: Every day | ORAL | Status: AC
  Administered 2017-01-03 – 2017-01-06 (×4): 750 mg via ORAL
  Filled 2017-01-03 (×4): qty 1

## 2017-01-03 MED ORDER — CARVEDILOL 12.5 MG PO TABS
12.5000 mg | ORAL_TABLET | Freq: Two times a day (BID) | ORAL | Status: DC
  Administered 2017-01-03 – 2017-01-08 (×9): 12.5 mg via ORAL
  Filled 2017-01-03 (×9): qty 1

## 2017-01-03 NOTE — Consult Note (Signed)
CARDIOLOGY CONSULT NOTE   Patient ID: [name] MRN: [id number] DOB/AGE: 21-Feb-1956 61 y.o.  Admit date: 12/31/2016  Primary Physician   [name], MD Primary Cardiologist   Dr [name] Reason for Consultation  CHF Requesting MD: Dr Bonner Puna  TWS:[id number] L [name] is a 61 y.o. year old male with a history of S-CHF, COPD, PAF on coumadin, s/p PPM, HTN, HLD, BPH, and GERD   Admitted 03/19 with CAP and CHF, cards asked to see today for CHF, not diuresing despite increased Lasix dosing.   He tracks his weight daily, but does not write it down. On 02/20, at office visit with Dr [name], he weighed.   He started feeling bad last week. Symptoms worsened over the weekend. Monday, he felt he could not get any air. He called EMS and was told his HR was 170. He did not go with them, but called the office. His loop was interrogated. He came to the ER as requested.    Since admission, his breathing is no better and his weight is increasing. His UOP was good at first, but has decreased. Home Torsemide dose is 60 mg bid. He sometimes misses the pm dose. Since admission, his Lasix dose has increased from 80 mg IV BID>>120 mg IV BID>>160 mg IV BID to start this pm. A bladder scan was performed, to see if urinary retention is an issue, but was negative.  He has had chest pain. He gets it every day, after he takes the Cardizem 120 mg bid and Bystolic 7.5 mg daily. About 20-30 minutes after he takes these meds, he will feel chest tightness. It lasts >30 minutes but will gradually ease off. It is a pressure, reaches a 7/10 at its worst. He occasionally get the chest pain with exertion, but not consistently. He is not having it now.    Past Medical History:  Diagnosis Date  . Anemia    supposed to be taking Vit B but doesn't  . ANXIETY    takes Xanax nightly  . Arthritis   . ASTHMA    Albuterol prn and Advair daily;also takes Prednisone daily  . Asthma   . Atrial fibrillation ([place]) 09/06/2015  .  Cardiomyopathy [facility])    a. EF 25% TEE July 2013; b. EF normalized 2015;  c. 03/2015 Echo: EF 40-45%, difrf HK, PASP 38 mmHg, Mild MR, sev LAE/RAE.  [name] Chronic constipation    takes OTC stool softener  . COPD (chronic obstructive pulmonary disease) ([place])   . DEPRESSION    takes Zoloft and Doxepin daily  . Diverticulitis   . DYSKINESIA, ESOPHAGUS   . Essential hypertension       . FIBROMYALGIA   . GERD (gastroesophageal reflux disease)       . Glaucoma   . HYPERLIPIDEMIA    a. Intolerant to statins.  . INSOMNIA    takes Ambien nightly  . Myocardial infarction    a. 2012 Myoview notable for prior infarct;  b. 03/2015 Lexiscan CL: EF 37%, diff HK, small area of inferior infarct from apex to base-->Med Rx.  . Myocardial infarction   . O2 dependent    uses oxygen 2 and 1/2 liters per Reform at hs  . Paroxysmal atrial fibrillation (HCC)    a. CHA2DS2VASc = 3--> takes Coumadin;  b. 03/15/2015 Successful TEE/DCCV;  c. 03/2015 recurrent afib, Amio d/c'd in setting of hyperthyroidism.  . Peripheral neuropathy ([place])   . Pneumonia 12/2016  . Rash and other  nonspecific skin eruption 04/12/2009   no cause found saw dermatologists x 2 and allergist  . SLEEP APNEA, OBSTRUCTIVE    a. doesn't use CPAP  . Syncope    a. 03/2015 s/p MDT LINQ.  [name] Type II diabetes mellitus ([place])          Past Surgical History:  Procedure Laterality Date  . ACNE CYST REMOVAL     2 on back   . CARDIAC CATHETERIZATION N/A 03/21/2016   Procedure: Right/Left Heart Cath and Coronary Angiography;  Surgeon: Larey Dresser, MD;  Location: [place] [facility];  Service: Cardiovascular;  Laterality: N/A;  . CARDIOVERSION  04/18/2012   Procedure: CARDIOVERSION;  Surgeon: Fay Records, MD;  Location: [place];  Service: Cardiovascular;  Laterality: N/A;  . CARDIOVERSION  04/25/2012   Procedure: CARDIOVERSION;  Surgeon: Thayer Headings, MD;  Location: [place];  Service: Cardiovascular;  Laterality: N/A;  . CARDIOVERSION  04/25/2012    Procedure: CARDIOVERSION;  Surgeon: Fay Records, MD;  Location: [place];  Service: Cardiovascular;  Laterality: N/A;  . CARDIOVERSION  05/09/2012   Procedure: CARDIOVERSION;  Surgeon: Sherren Mocha, MD;  Location: [place];  Service: Cardiovascular;  Laterality: N/A;  changed from Dorea Duff to cooper by trish/[name]  . CARDIOVERSION N/A 03/15/2015   Procedure: CARDIOVERSION;  Surgeon: Thayer Headings, MD;  Location[facility] ENDOSCOPY;  Service: Cardiovascular;  Laterality: N/A;  . COLONOSCOPY    . COLONOSCOPY WITH PROPOFOL N/A 10/21/2014   Procedure: COLONOSCOPY WITH PROPOFOL;  Surgeon: Ladene Artist, MD;  Location: [facility] ENDOSCOPY;  Service: Endoscopy;  Laterality: N/A;  . EP IMPLANTABLE DEVICE N/A 04/06/2015   Procedure: Loop Recorder Insertion;  Surgeon: Evans Lance, MD;  Location: [place] [facility];  Service: Cardiovascular;  Laterality: N/A;  . ESOPHAGOGASTRODUODENOSCOPY    . TEE WITHOUT CARDIOVERSION  04/25/2012   Procedure: TRANSESOPHAGEAL ECHOCARDIOGRAM (TEE);  Surgeon: Thayer Headings, MD;  Location: [place];  Service: Cardiovascular;  Laterality: N/A;  . TEE WITHOUT CARDIOVERSION N/A 03/15/2015   Procedure: TRANSESOPHAGEAL ECHOCARDIOGRAM (TEE);  Surgeon: Thayer Headings, MD;  Location: [place];  Service: Cardiovascular;  Laterality: N/A;  . TONSILLECTOMY    . TOTAL KNEE ARTHROPLASTY Right 06/15/2014   Procedure: TOTAL KNEE ARTHROPLASTY;  Surgeon: Renette Butters, MD;  Location: [place];  Service: Orthopedics;  Laterality: Right;    Allergies  Allergen Reactions  . Amiodarone Other (See Comments)    hyperthyroidism  . Pravastatin Sodium Other (See Comments)    myalgia  . Simvastatin Other (See Comments)    severe leg cramps  . Statins Other (See Comments)    myalgia  . Tape Other (See Comments)    Skin Tears Use Paper Tape Only    I have reviewed the patient's current medications . carisoprodol  350 mg Oral TID  . diltiazem  120 mg Oral BID  . doxepin  25 mg Oral BID  .  fluticasone furoate-vilanterol  1 puff Inhalation Daily  . furosemide  160 mg Intravenous Q12H  . hydrocortisone  1 application Topical BID  . levofloxacin  750 mg Oral Daily  . levothyroxine  25 mcg Oral QAC breakfast  . lidocaine  1 patch Transdermal Q24H  . mupirocin cream   Topical BID  . nebivolol  7.5 mg Oral Daily  . pantoprazole  40 mg Oral Daily  . potassium chloride SA  40 mEq Oral Daily  . sertraline  200 mg Oral QHS  . sodium chloride flush  3  mL Intravenous Q12H  . tamsulosin  0.4 mg Oral QPC breakfast  . warfarin  2.5 mg Oral ONCE-1800  . Warfarin - Pharmacist Dosing Inpatient   Does not apply q1800  . zolpidem  5 mg Oral QHS,MR X 1    acetaminophen **OR** acetaminophen, albuterol, alum & mag hydroxide-simeth, hydrALAZINE, linaclotide, ondansetron, traZODone  Medication Sig  ADVAIR DISKUS 250-50 MCG/DOSE AEPB INHALE 1 PUFF EVERY DAY AS DIRECTED  albuterol (PROVENTIL HFA;VENTOLIN HFA) 108 (90 BASE) MCG/ACT inhaler Inhale 2 puffs into the lungs 4 (four) times daily. Also may Inhale 2 puffs into the lungs 4 times a day as needed for wheezing  ALPRAZolam (XANAX) 0.5 MG tablet 1-2 tab by mouth at bedtime as needed for sleep  BYSTOLIC 5 MG tablet TAKE 1 AND 1/2 TABLETS BY MOUTH EVERY DAY  carisoprodol (SOMA) 350 MG tablet TAKE 1 TABLET BY MOUTH 3 TIMES A DAY AS NEEDED FOR MUSCLE SPASMS  diltiazem (CARDIZEM CD) 120 MG 24 hr capsule Take 1 capsule (120 mg total) by mouth 2 (two) times daily.  diphenhydrAMINE (BENADRYL) 25 MG tablet Take 25 mg by mouth every 6 (six) hours as needed for itching.  doxepin (SINEQUAN) 25 MG capsule TAKE ONE CAPSULE BY MOUTH TWICE A DAY  fluocinonide cream (LIDEX) 8.24 % Apply 1 application topically 2 (two) times daily.  hydrocortisone 2.5 % cream Apply 1 application topically 2 (two) times daily. itching  hydrOXYzine (ATARAX/VISTARIL) 25 MG tablet Take 25 mg by mouth daily as needed for itching.  ibuprofen (ADVIL,MOTRIN) 200 MG tablet Take 400 mg by  mouth 2 (two) times daily.  levothyroxine (SYNTHROID, LEVOTHROID) 25 MCG tablet TAKE 1 TABLET BY MOUTH EVERY DAY BEFORE BREAKFAST  lidocaine (LIDODERM) 5 % Apply 1 patch every 12 hrs to affected area. Do not use more than one patch in 24 hrs.  Linaclotide (LINZESS) 145 MCG CAPS capsule Take 145 mcg by mouth daily as needed (Crampy pain).   omeprazole (PRILOSEC) 20 MG capsule Take 1 capsule (20 mg total) by mouth daily with supper.  potassium chloride SA (K-DUR,KLOR-CON) 20 MEQ tablet Take 40 mEq by mouth daily.  sertraline (ZOLOFT) 100 MG tablet TAKE 2 TABLETS BY MOUTH AT BEDTIME  silver sulfADIAZINE (SILVADENE) 1 % cream APPLY TO AFFECTED AREA TOPICALLY EVERY DAY  torsemide (DEMADEX) 20 MG tablet Take 3 tablets (60 mg total) by mouth 2 (two) times daily.  traZODone (DESYREL) 100 MG tablet TAKE 1/2 TABLET BY MOUTH AT BEDTIME AS NEEDED  TUMS 500 MG chewable tablet Chew 500 mg by mouth daily as needed.  warfarin (COUMADIN) 5 MG tablet Take as directed by anticoagulation clinic Patient taking differently: Take 2.5-5 mg by mouth See admin instructions. 2.5mg  on Mondays and Fridays, 5mg  all other days  zolpidem (AMBIEN) 10 MG tablet TAKE 1 TABLET BY MOUTH AT BEDTIME     Social History   Social History  . Marital status: Divorced    Spouse name: N/A  . Number of children: 2  . Years of education: N/A   Occupational History  . retired/disabled. prev worked in Therapist, sports. Disabled   Social History Main Topics  . Smoking status: Former Smoker    Packs/day: 2.00    Years: 30.00    Types: Cigarettes    Quit date: 10/16/2007  . Smokeless tobacco: Never Used  . Alcohol use No  . Drug use: No  . Sexual activity: Not on file   Other Topics Concern  . Not on file   Social History Narrative  Lives alone.    Family Status  Relation Status  . Mother Alive  . Father Alive  . Maternal Grandmother   . Neg Hx    Family History  Problem Relation Age of Onset  . COPD Mother   .  Asthma Mother   . Colon polyps Mother   . Allergies Mother   . Hypothyroidism Mother   . Asthma Maternal Grandmother   . Colon cancer Neg Hx      ROS:  Full 14 point review of systems complete and found to be negative unless listed above.  Physical Exam: Blood pressure 120/86, pulse 69, temperature 98 F (36.7 C), temperature source Oral, resp. rate 18, height 5\' 10"  (1.778 m), weight (!) 315 lb 14.4 oz (143.3 kg), SpO2 98 %.  General: Well developed, well nourished, male in no acute distress Head: Eyes PERRLA, No xanthomas.   Normocephalic and atraumatic, oropharynx without edema or exudate. Dentition: fair Lungs: decreased BS bases w/ some rales.  Heart: Heart irregular rate and rhythm with S1, S2  murmur. pulses are 2+ all 4 extrem.   Neck: No carotid bruits. No lymphadenopathy.  JVD elevated to jaw Abdomen: Bowel sounds present, abdomen soft and non-tender without masses or hernias noted. Msk:  No spine or cva tenderness. No weakness, no joint deformities or effusions. Extremities: No clubbing or cyanosis. 2+ LE edema.  Neuro: Alert and oriented X 3. No focal deficits noted. Psych:  Good affect, responds appropriately Skin: No rashes or lesions noted.  Labs:   Lab Results  Component Value Date   WBC 6.9 01/01/2017   HGB 12.2 (L) 01/01/2017   HCT 38.4 (L) 01/01/2017   MCV 92.3 01/01/2017   PLT 176 01/01/2017    Recent Labs  01/03/17 0412  INR 2.62    Recent Labs Lab 01/03/17 0412  NA 134*  K 4.5  CL 97*  CO2 26  BUN 23*  CREATININE 1.19  CALCIUM 8.7*  GLUCOSE 94   B Natriuretic Peptide  Date/Time Value Ref Range Status  12/31/2016 11:36 AM 395.2 (H) 0.0 - 100.0 pg/mL Final  03/30/2015 02:24 PM 506.3 (H) 0.0 - 100.0 pg/mL Final   Lab Results  Component Value Date   CHOL 164 12/04/2016   HDL 35.20 (L) 12/04/2016   LDLCALC 102 (H) 12/04/2016   TRIG 134.0 12/04/2016   TSH  Date/Time Value Ref Range Status  11/29/2016 03:18 PM 1.48 0.35 - 4.50 uIU/mL  Final   Echo: 08/29/2015 - Procedure narrative: Transthoracic echocardiography. Image   quality was suboptimal. The study was technically difficult.   Intravenous contrast (Definity) was administered. - Left ventricle: The cavity size was mildly dilated. Wall   thickness was normal. Systolic function was moderately to   severely reduced. The estimated ejection fraction was in the   range of 30% to 35%. Moderate diffuse hypokinesis with no   identifiable regional variations. Acoustic contrast opacification   revealed no evidence of thrombus. - Left atrium: The atrium was severely dilated. - Right ventricle: The cavity size was moderately dilated. Systolic   function was mildly reduced. - Right atrium: The atrium was severely dilated.  ECG:  03/19 Atrial fib, RVR, HR 117 LAFB, RBBB, both old  Cath: 03/21/2016 Conclusion   1. Mild CAD (80% stenosis in ostial small D1, too small for intervention and did not cause cardiomyopathy).  2. Optimized filling pressures.  Relatively preserved cardiac output.  3. EF about 30% with diffuse hypokinesis.   Nonischemic cardiomyopathy.  May resume  coumadin tonight.  Needs medical management of cardiomyopathy, per Dr [name].     Radiology:   Dg Chest 2 View Result Date: 12/31/2016 CLINICAL DATA:  Shortness of breath with lower extremity and upper extremity edema EXAM: CHEST  2 VIEW COMPARISON:  August 31, 2015 FINDINGS: There is a small right pleural effusion with patchy focal opacity in the posterior right base, felt to represent a small area of pneumonia. Lungs elsewhere are clear. There is cardiomegaly with pulmonary venous hypertension. No adenopathy. There is a loop recorder on the left. There is degenerative change in the thoracic spine. IMPRESSION: Underlying pulmonary vascular congestion. Small focus of infiltrate posterior right base with small right pleural effusion. Suspect localized pneumonia in this area. No evident adenopathy. Followup PA  and lateral chest radiographs recommended in 3-4 weeks following trial of antibiotic therapy to ensure resolution and exclude underlying malignancy. Electronically Signed   By: Lowella Grip III M.D.   On: 12/31/2016 13:00     ASSESSMENT AND PLAN:   The patient was seen today by Dr Stanford Breed, the patient evaluated and the data reviewed.   Principal Problem:   CAP (community acquired pneumonia) - per IM - CXR report recommends PA/Lat CXR in 3-4 weeks, told pt this.    Acute on chronic diastolic CHF - Pt weight is up about 15 lbs - he has not diuresed, despite high doses of Lasix - renal function is trending up but still good. - will add metolazone 5 mg qd to the am Lasix, continue at 160 mg bid. - if this does not work, try Bumex. - emphasize writing daily weights down and will make sure pt is compliant with 2000 mg diet and < 2L liquids daily. - would arrange TCM f/u in office after discharge w/ L. Gerhardt or other extender to more aggressively manage CHF    Permanent atrial fib - Not sure Diltiazem is the best medication for rate control since EF is low - metoprolol was changed to bisoprolol for better rate control - could increase the bisoprolol to 10 mg qd or change to Coreg to see if that works better for him.  Otherwise, per IM Active Problems:   Essential hypertension   GERD   Anticoagulated on warfarin   COPD (chronic obstructive pulmonary disease) (HCC)   Hyperlipidemia   Atrial fibrillation ([place])   Acute on chronic respiratory failure ([place])   SignedRosaria Ferries, PA-C 01/03/2017 1:37 PM Beeper [phone]  Co-Sign MD  As above, patient seen and examined. Briefly he is a 61 year old male with past medical history of nonischemic cardiomyopathy, chronic systolic congestive heart failure, permanent atrial fibrillation, prior pacemaker placement, COPD, hypertension, hyperlipidemia, obesity for evaluation of acute on chronic systolic congestive heart failure. Last  echocardiogram November 2016 showed ejection fraction 30-35%, severe biatrial enlargement, moderate right ventricular enlargement and mildly reduced RV function. Cardiac catheterization June 2017 showed a small D1 ostial 80% lesion but otherwise no significant coronary disease. Ejection fraction 30%. Patient states that over the past 1 week he has had increased dyspnea on exertion, weight gain of approximately 10 pounds and increasing pedal edema. He has chest tightness that increases with inspiration. He was admitted and has not been diuresing well and cardiology asked to evaluate.  Exam shows a morbidly obese male. He has mild crackles at his bases bilaterally. His cardiovascular exam shows an irregular rhythm. He has 1-2+ bilateral lower extremity edema.  Chest x-ray shows vascular congestion and question focal pneumonia. BUN 23 creatinine 1.19.  Hemoglobin 12.2. BNP 395. Electrocardiogram shows atrial fibrillation with rapid ventricular response, left anterior fascicular block, right bundle branch block, cannot rule out prior anterior infarct, left ventricular hypertrophy.  1 acute on chronic systolic congestive heart failure-plan repeat echocardiogram. He is volume overloaded on examination. He has been on Demadex 60 mg twice a day at home and states he has been compliant with low-sodium diet and fluid restriction. I will continue with Lasix 160 mg IV twice a day. Add metolazone 5 mg daily. Follow sodium, potassium and renal function closely. Strict I's and O's and daily weights. Patient educated on daily weights at home, low sodium diet and fluid restriction.  2 nonischemic cardiomyopathy-his ejection fraction appears to be in the 30-35% range. Change bysystolic to carvedilol 63.7 mg twice a day. Follow blood pressure and add low-dose ACE inhibitor as tolerated by blood pressure. He will need reassessment of his LV function in 3 months and if ejection fraction less than 35% would need upgrade of  pacemaker to ICD.  3 permanent atrial fibrillation -given reduced LV function I will wean Cardizem (change to daily and then DC later if HR allows). Discontinue bysystolic. Begin carvedilol 12.5 mg daily tomorrow morning. Advance as needed. If blood pressure becomes an issue we will add digoxin. Continue Coumadin. CHADSvasc 4.   4 prior pacemaker   5 Morbid obesity-needs weight loss.  6 COPD- per IM.  Kirk Ruths, MD

## 2017-01-03 NOTE — Progress Notes (Signed)
[facility]link summary report received. Battery status OK. Normal device function. No new symptom episodes, tachy episodes, brady, or pause episodes. 21 AF 99.9% +warfarin +cardizem. Histograms slightly elevated. Monthly summary reports and ROV/PRN

## 2017-01-03 NOTE — Progress Notes (Signed)
ANTICOAGULATION CONSULT NOTE - Follow-Up Consult  Pharmacy Consult for Warfarin Indication: atrial fibrillation  Allergies  Allergen Reactions  . Amiodarone Other (See Comments)    hyperthyroidism  . Pravastatin Sodium Other (See Comments)    myalgia  . Simvastatin Other (See Comments)    severe leg cramps  . Statins Other (See Comments)    myalgia  . Tape Other (See Comments)    Skin Tears Use Paper Tape Only    Patient Measurements: Height: 5\' 10"  (177.8 cm) Weight: (!) 315 lb 14.4 oz (143.3 kg) IBW/kg (Calculated) : 73  Vital Signs: Temp: 98 F (36.7 C) (03/22 0449) Temp Source: Oral (03/22 0449) BP: 120/86 (03/22 0751) Pulse Rate: 69 (03/22 0751)  Labs:  Recent Labs  12/31/16 1136  01/01/17 0539 01/02/17 1001 01/03/17 0412  HGB 12.5*  --  12.2*  --   --   HCT 39.7  --  38.4*  --   --   PLT 159  --  176  --   --   LABPROT  --   < > 23.4* 27.4* 28.5*  INR  --   < > 2.04 2.49 2.62  CREATININE 0.78  --  1.02 1.11 1.19  TROPONINI <0.03  --   --   --   --   < > = values in this interval not displayed.  Estimated Creatinine Clearance (by C-G formula based on SCr of 1.19 mg/dL) Male: 76.1 mL/min Male: 92 mL/min   Medical History: Past Medical History:  Diagnosis Date  . Anemia    supposed to be taking Vit B but doesn't  . ANXIETY    takes Xanax nightly  . Arthritis   . ASTHMA    Albuterol prn and Advair daily;also takes Prednisone daily  . Asthma   . Atrial fibrillation ([place]) 09/06/2015  . Cardiomyopathy [facility])    a. EF 25% TEE July 2013; b. EF normalized 2015;  c. 03/2015 Echo: EF 40-45%, difrf HK, PASP 38 mmHg, Mild MR, sev LAE/RAE.  [name] Chronic constipation    takes OTC stool softener  . COPD (chronic obstructive pulmonary disease) ([place])   . DEPRESSION    takes Zoloft and Doxepin daily  . Diverticulitis   . DYSKINESIA, ESOPHAGUS   . Essential hypertension       . FIBROMYALGIA   . GERD (gastroesophageal reflux disease)       . Glaucoma   .  HYPERLIPIDEMIA    a. Intolerant to statins.  . INSOMNIA    takes Ambien nightly  . Myocardial infarction    a. 2012 Myoview notable for prior infarct;  b. 03/2015 Lexiscan CL: EF 37%, diff HK, small area of inferior infarct from apex to base-->Med Rx.  . Myocardial infarction   . O2 dependent    uses oxygen 2 and 1/2 liters per [place] at hs  . Paroxysmal atrial fibrillation (HCC)    a. CHA2DS2VASc = 3--> takes Coumadin;  b. 03/15/2015 Successful TEE/DCCV;  c. 03/2015 recurrent afib, Amio d/c'd in setting of hyperthyroidism.  . Peripheral neuropathy ([place])   . Pneumonia 12/2016  . Rash and other nonspecific skin eruption 04/12/2009   no cause found saw dermatologists x 2 and allergist  . SLEEP APNEA, OBSTRUCTIVE    a. doesn't use CPAP  . Syncope    a. 03/2015 s/p MDT LINQ.  [name] Type II diabetes mellitus (HCC)         Medications:  Prescriptions Prior to Admission  Medication Sig Dispense Refill Last Dose  .  ADVAIR DISKUS 250-50 MCG/DOSE AEPB INHALE 1 PUFF EVERY DAY AS DIRECTED 60 each 3 12/30/2016 at Unknown time  . albuterol (PROVENTIL HFA;VENTOLIN HFA) 108 (90 BASE) MCG/ACT inhaler Inhale 2 puffs into the lungs 4 (four) times daily. Also may Inhale 2 puffs into the lungs 4 times a day as needed for wheezing   rescue at rescue  . ALPRAZolam (XANAX) 0.5 MG tablet 1-2 tab by mouth at bedtime as needed for sleep 180 tablet 1 Past Week at Unknown time  . BYSTOLIC 5 MG tablet TAKE 1 AND 1/2 TABLETS BY MOUTH EVERY DAY 45 tablet 2 12/30/2016 at Unknown time  . carisoprodol (SOMA) 350 MG tablet TAKE 1 TABLET BY MOUTH 3 TIMES A DAY AS NEEDED FOR MUSCLE SPASMS 90 tablet 5 Past Week at Unknown time  . diltiazem (CARDIZEM CD) 120 MG 24 hr capsule Take 1 capsule (120 mg total) by mouth 2 (two) times daily. 180 capsule 2 12/30/2016 at Unknown time  . diphenhydrAMINE (BENADRYL) 25 MG tablet Take 25 mg by mouth every 6 (six) hours as needed for itching.   Past Week at Unknown time  . doxepin (SINEQUAN) 25 MG  capsule TAKE ONE CAPSULE BY MOUTH TWICE A DAY 180 capsule 1 12/30/2016 at Unknown time  . fluocinonide cream (LIDEX) 4.23 % Apply 1 application topically 2 (two) times daily. 30 g 0 12/30/2016 at Unknown time  . hydrocortisone 2.5 % cream Apply 1 application topically 2 (two) times daily. itching   12/30/2016 at Unknown time  . hydrOXYzine (ATARAX/VISTARIL) 25 MG tablet Take 25 mg by mouth daily as needed for itching.   12/30/2016 at Unknown time  . ibuprofen (ADVIL,MOTRIN) 200 MG tablet Take 400 mg by mouth 2 (two) times daily.   12/30/2016 at Unknown time  . levothyroxine (SYNTHROID, LEVOTHROID) 25 MCG tablet TAKE 1 TABLET BY MOUTH EVERY DAY BEFORE BREAKFAST 90 tablet 2 12/30/2016 at Unknown time  . lidocaine (LIDODERM) 5 % Apply 1 patch every 12 hrs to affected area. Do not use more than one patch in 24 hrs. 100 patch 1 12/30/2016 at Unknown time  . Linaclotide (LINZESS) 145 MCG CAPS capsule Take 145 mcg by mouth daily as needed (Crampy pain).    Past Week at Unknown time  . omeprazole (PRILOSEC) 20 MG capsule Take 1 capsule (20 mg total) by mouth daily with supper. 90 capsule 1 12/30/2016 at Unknown time  . potassium chloride SA (K-DUR,KLOR-CON) 20 MEQ tablet Take 40 mEq by mouth daily.   12/30/2016 at Unknown time  . sertraline (ZOLOFT) 100 MG tablet TAKE 2 TABLETS BY MOUTH AT BEDTIME 180 tablet 3 12/30/2016 at Unknown time  . silver sulfADIAZINE (SILVADENE) 1 % cream APPLY TO AFFECTED AREA TOPICALLY EVERY DAY 50 g 3 12/30/2016 at Unknown time  . torsemide (DEMADEX) 20 MG tablet Take 3 tablets (60 mg total) by mouth 2 (two) times daily. 180 tablet 3 12/30/2016 at Unknown time  . traZODone (DESYREL) 100 MG tablet TAKE 1/2 TABLET BY MOUTH AT BEDTIME AS NEEDED 45 tablet 1 Past Week at Unknown time  . TUMS 500 MG chewable tablet Chew 500 mg by mouth daily as needed.   Past Week at Unknown time  . warfarin (COUMADIN) 5 MG tablet Take as directed by anticoagulation clinic (Patient taking differently: Take 2.5-5 mg  by mouth See admin instructions. 2.5mg  on Mondays and Fridays, 5mg  all other days) 30 tablet 3 12/30/2016 at 1800  . zolpidem (AMBIEN) 10 MG tablet TAKE 1 TABLET BY MOUTH AT  BEDTIME 30 tablet 5 12/30/2016 at Unknown time   Scheduled:  . azithromycin  500 mg Intravenous Q24H  . carisoprodol  350 mg Oral TID  . cefTRIAXone (ROCEPHIN)  IV  1 g Intravenous Q24H  . diltiazem  120 mg Oral BID  . doxepin  25 mg Oral BID  . fluticasone furoate-vilanterol  1 puff Inhalation Daily  . furosemide  120 mg Intravenous Q12H  . hydrocortisone  1 application Topical BID  . levothyroxine  25 mcg Oral QAC breakfast  . lidocaine  1 patch Transdermal Q24H  . mupirocin cream   Topical BID  . nebivolol  7.5 mg Oral Daily  . pantoprazole  40 mg Oral Daily  . potassium chloride SA  40 mEq Oral Daily  . sertraline  200 mg Oral QHS  . sodium chloride flush  3 mL Intravenous Q12H  . tamsulosin  0.4 mg Oral QPC breakfast  . Warfarin - Pharmacist Dosing Inpatient   Does not apply q1800  . zolpidem  5 mg Oral QHS,MR X 1   Infusions:   PRN: acetaminophen **OR** acetaminophen, albuterol, alum & mag hydroxide-simeth, hydrALAZINE, linaclotide, ondansetron, traZODone  Assessment: Patient is a 61 yo M admitted 3/19 w/ worsening shortness of breath and weakness. Patient is on warfarin PTA for a fib. Pharmacy was consulted to dose while inpatient.   PTA warfarin dose: 2.5mg  Mon and Fri, 5mg  all other days per patient discussion and chart review  INR therapeutic   Goal of Therapy:  INR 2-3 Monitor platelets by anticoagulation protocol: Yes   Plan:  Warfarin 2.5mg  PO x1 tonight Daily INR Monitor signs/symptoms of bleeding  Thank you Anette Guarneri, PharmD [phone] 01/03/2017 10:41 AM

## 2017-01-03 NOTE — Progress Notes (Signed)
PROGRESS NOTE  [name]  YNW:[id number] DOB: 08-08-1956 DOA: 12/31/2016 PCP: [name], MD   Brief Narrative: [name] is a 61 y.o. male with a history of diastolic CHF with last TEE in 2016 demonstrating EF 50-55%, asthma, COPD, chronic respiratory failure on intermittent use of nocturnal oxygen, atrial fibrillation - s/p DCCV in 2016, on coumadin, s/p PPM, HTN, HLD, BPH, and GERD who presented to the emergency department with complaints of progressive worsening of shortness of breath and weakness present for weeks worsening over previous 48 hours, having woken up feeling smothered. He called EMS, but refused transport due to cost, and later had cardiology remotely interrogate PPM. They found an accelerated heart rate 170 bpm and referred him to the ED for further evaluation. Upon arrival he was tachypneic with respirations of 22 with heart rate was 145 and EKG showed atopy with RBBB and left fascicular block, and nonspecific ST- T wave abnormality He was hypoxic, improved on 3 L O2. Blood work was unremarkable, CXR showed posterior right base infiltrate with small right pleural effusion suspicious for pneumonia superimposed on underlying pulmonary vascular congestion. The patient was admitted for acute on chronic respiratory failure secondary to CAP and decompensated CHF resulting in Afib w/ RVR. Rate has been well-controlled. Lasix 80mg  IV BID was started with limited diuresis, so this was augmented.   Assessment & Plan: Principal Problem:   CAP (community acquired pneumonia) Active Problems:   Essential hypertension   GERD   Anticoagulated on warfarin   COPD (chronic obstructive pulmonary disease) (HCC)   Hyperlipidemia   Atrial fibrillation (HCC)   Acute on chronic respiratory failure (HCC)  Acute on chronic hypoxemic respiratory failure: Multifactorial, associated with CAP, CHF exacerbation superimposed on COPD. Patient uses nocturnal O2 at home intermittently. Currently O2  requirement increased to continuous O2 at 3L - Continue to treat underlying issues and wean off O2 as tolerated  CAP: RLL with associated small effusion. Strep pneumo Ag negative, HIV NR.  - No leukocytosis or fever > transition to po abx to complete 7 days. - Continue incentive spirometry, expectorant prn - Monitor blood cultures - Recommend follow up CXR as outpatient to ensure resolution of infiltrate and effusion.   Acute on chronic CHF: Due to decreasing torsemide dose to 60mg  daily (instead of BID) at home due to frequent urination. EDW appears to be ~302lbs. 310lbs at admission. BNP 395. Tn neg.  - Holding torsemide and started lasix 80 mg IV BID > up to 120mg  IV with poor UOP and weight not increasing. Will augment to 160mg  IV BID and ask for cardiology assistance.  - Follow renal function, daily I/O and daily weights. - Update echocardiogram ordered.  Afib: Currently rate controlled. - Continue coumadin per pharmacy. INR 1.96 on admission. - Keep K > 4, repleting daily - Continue nebivolol and diltiazem  GERD: Chronic, stable - Continue PPI  Hypertension: Chronic, stable - Continue home medication and adjust the doses as needed  Dermatitis: Pruritic, nonspecific. Has been biopsied by dermatology as outpatient. No disease-specific therapy was advised. No evidence of bacterial infection. - Topical treatments - Superficial cultures were ordered at admission, pending.  Hypothyroidism: most recent TSH 1.48 on 11/29/2016 - Continue home synthroid  Muscle spasms: Reordered home medications. Stable.  BPH:  - Restarted flomax, still with weak stream per pt.  - Will r/o retention with bladder scan today  DVT prophylaxis: Coumadin Code Status: Full Family Communication: None at bedside this AM. Disposition Plan: Anticipate DC  home with girlfriend once improving. Still requires IV lasix.    Consultants:   None  Procedures:   None  Antimicrobials:  Azithromycin  3/19 >>   Ceftriaxone 3/19 >>   Subjective: Pt continues to report no improvement in dyspnea. Didn't sleep well. Denies chest pain or palpitations. Leg swelling stable, worse than baseline. Has weak stream of urine which he's had in the past improved with flomax did not improve in past 24 hours.   Objective: Vitals:   01/02/17 2113 01/03/17 0449 01/03/17 0751 01/03/17 0945  BP: 93/61 100/67 120/86   Pulse: 76 61 69   Resp: 18 18    Temp: 97.6 F (36.4 C) 98 F (36.7 C)    TempSrc:  Oral    SpO2: 94% 91% 100% 98%  Weight:  (!) 143.3 kg (315 lb 14.4 oz)    Height:        Intake/Output Summary (Last 24 hours) at 01/03/17 1302 Last data filed at 01/03/17 0753  Gross per 24 hour  Intake              512 ml  Output              270 ml  Net              242 ml   Filed Weights   12/31/16 2143 01/02/17 0639 01/03/17 0449  Weight: (!) 140.8 kg (310 lb 4.8 oz) (!) 141.9 kg (312 lb 14.4 oz) (!) 143.3 kg (315 lb 14.4 oz)    Examination: General exam: Morbidly obese, pleasant male in no distress Respiratory system: Non-labored breathing on 3L by [place]. Bibasilar crackles and diminished at right base.  Cardiovascular system: Irreg irreg. No murmur, rub, or gallop. No JVD, and 2+ pitting LE edema unchanged. Gastrointestinal system: Abdomen soft, non-tender, non-distended, with normoactive bowel sounds. No organomegaly or masses felt. Central nervous system: Alert and oriented. No focal neurological deficits. Extremities: Warm, no deformities Skin: Scattered nonconfluent erythematous-violaceous papules, some excoriated and draining serous fluid. No purulence, induration, or fluctuance. In stages of resolution on back under dressing. Psychiatry: Judgement and insight appear normal. Mood & affect appropriate.   Data Reviewed: I have personally reviewed following labs and imaging studies  CBC:  Recent Labs Lab 12/31/16 1136 01/01/17 0539  WBC 6.9 6.9  HGB 12.5* 12.2*  HCT 39.7 38.4*    MCV 93.0 92.3  PLT 159 914   Basic Metabolic Panel:  Recent Labs Lab 12/31/16 1136 01/01/17 0539 01/02/17 1001 01/03/17 0412  NA 140 143 138 134*  K 4.4 3.8 3.8 4.5  CL 105 105 100* 97*  CO2 28 27 28 26   GLUCOSE 95 119* 153* 94  BUN 7 10 17  23*  CREATININE 0.78 1.02 1.11 1.19  CALCIUM 8.8* 9.1 8.4* 8.7*   GFR: Estimated Creatinine Clearance (by C-G formula based on SCr of 1.19 mg/dL) Male: 76.1 mL/min Male: 92 mL/min Liver Function Tests: No results for input(s): AST, ALT, ALKPHOS, BILITOT, PROT, ALBUMIN in the last 168 hours. No results for input(s): LIPASE, AMYLASE in the last 168 hours. No results for input(s): AMMONIA in the last 168 hours. Coagulation Profile:  Recent Labs Lab 12/31/16 1902 01/01/17 0539 01/02/17 1001 01/03/17 0412  INR 1.96 2.04 2.49 2.62   Cardiac Enzymes:  Recent Labs Lab 12/31/16 1136  TROPONINI <0.03   BNP (last 3 results) No results for input(s): PROBNP in the last 8760 hours. HbA1C: No results for input(s): HGBA1C in the last 72 hours.  CBG: No results for input(s): GLUCAP in the last 168 hours. Lipid Profile: No results for input(s): CHOL, HDL, LDLCALC, TRIG, CHOLHDL, LDLDIRECT in the last 72 hours. Thyroid Function Tests: No results for input(s): TSH, T4TOTAL, FREET4, T3FREE, THYROIDAB in the last 72 hours. Anemia Panel: No results for input(s): VITAMINB12, FOLATE, FERRITIN, TIBC, IRON, RETICCTPCT in the last 72 hours. Urine analysis:    Component Value Date/Time   COLORURINE YELLOW 11/29/2016 1518   APPEARANCEUR CLEAR 11/29/2016 1518   LABSPEC 1.020 11/29/2016 1518   PHURINE 6.0 11/29/2016 1518   GLUCOSEU NEGATIVE 11/29/2016 1518   HGBUR SMALL (A) 11/29/2016 1518   BILIRUBINUR NEGATIVE 11/29/2016 1518   KETONESUR NEGATIVE 11/29/2016 1518   PROTEINUR NEGATIVE 06/07/2014 1031   UROBILINOGEN 0.2 11/29/2016 1518   NITRITE NEGATIVE 11/29/2016 1518   LEUKOCYTESUR NEGATIVE 11/29/2016 1518   Recent Results (from the  past 240 hour(s))  Culture, blood (routine x 2) Call MD if unable to obtain prior to antibiotics being given     Status: None (Preliminary result)   Collection Time: 12/31/16  6:58 PM  Result Value Ref Range Status   Specimen Description BLOOD RIGHT ANTECUBITAL  Final   Special Requests BOTTLES DRAWN AEROBIC ONLY 5CC  Final   Culture NO GROWTH 3 DAYS  Final   Report Status PENDING  Incomplete  Culture, blood (routine x 2) Call MD if unable to obtain prior to antibiotics being given     Status: None (Preliminary result)   Collection Time: 12/31/16  7:07 PM  Result Value Ref Range Status   Specimen Description BLOOD LEFT ANTECUBITAL  Final   Special Requests BOTTLES DRAWN AEROBIC AND ANAEROBIC 5CC  Final   Culture NO GROWTH 3 DAYS  Final   Report Status PENDING  Incomplete  Aerobic Culture (superficial specimen)     Status: None (Preliminary result)   Collection Time: 12/31/16 11:32 PM  Result Value Ref Range Status   Specimen Description BACK  Final   Special Requests Normal  Final   Gram Stain   Final    NO WBC SEEN RARE SQUAMOUS EPITHELIAL CELLS PRESENT NO ORGANISMS SEEN    Culture   Final    FEW STAPHYLOCOCCUS AUREUS SUSCEPTIBILITIES TO FOLLOW    Report Status PENDING  Incomplete      Radiology Studies: No results found.  Scheduled Meds: . azithromycin  500 mg Intravenous Q24H  . carisoprodol  350 mg Oral TID  . cefTRIAXone (ROCEPHIN)  IV  1 g Intravenous Q24H  . diltiazem  120 mg Oral BID  . doxepin  25 mg Oral BID  . fluticasone furoate-vilanterol  1 puff Inhalation Daily  . furosemide  160 mg Intravenous Q12H  . hydrocortisone  1 application Topical BID  . levothyroxine  25 mcg Oral QAC breakfast  . lidocaine  1 patch Transdermal Q24H  . mupirocin cream   Topical BID  . nebivolol  7.5 mg Oral Daily  . pantoprazole  40 mg Oral Daily  . potassium chloride SA  40 mEq Oral Daily  . sertraline  200 mg Oral QHS  . sodium chloride flush  3 mL Intravenous Q12H  .  tamsulosin  0.4 mg Oral QPC breakfast  . warfarin  2.5 mg Oral ONCE-1800  . Warfarin - Pharmacist Dosing Inpatient   Does not apply q1800  . zolpidem  5 mg Oral QHS,MR X 1   Continuous Infusions:   LOS: 3 days   Time spent: 25 minutes.  Vance Gather, MD [facility]  Pager [phone]  If 7PM-7AM, please contact night-coverage [email or web address] Password [email or web address] 01/03/2017, 1:02 PM

## 2017-01-04 ENCOUNTER — Ambulatory Visit: Payer: Medicare HMO

## 2017-01-04 ENCOUNTER — Other Ambulatory Visit (HOSPITAL_COMMUNITY): Payer: Medicare HMO

## 2017-01-04 ENCOUNTER — Inpatient Hospital Stay (HOSPITAL_COMMUNITY): Payer: Medicare HMO

## 2017-01-04 DIAGNOSIS — I5021 Acute systolic (congestive) heart failure: Secondary | ICD-10-CM

## 2017-01-04 DIAGNOSIS — I4891 Unspecified atrial fibrillation: Secondary | ICD-10-CM

## 2017-01-04 DIAGNOSIS — I482 Chronic atrial fibrillation: Secondary | ICD-10-CM

## 2017-01-04 LAB — BASIC METABOLIC PANEL
Anion gap: 9 (ref 5–15)
BUN: 23 mg/dL — AB (ref 6–20)
CHLORIDE: 96 mmol/L — AB (ref 101–111)
CO2: 31 mmol/L (ref 22–32)
Calcium: 8.5 mg/dL — ABNORMAL LOW (ref 8.9–10.3)
Creatinine, Ser: 1.32 mg/dL — ABNORMAL HIGH (ref 0.61–1.24)
GFR calc Af Amer: >60 mL/min (ref >60)
GFR calc non Af Amer: 56 mL/min — ABNORMAL LOW (ref >60)
GLUCOSE: 84 mg/dL (ref 65–99)
POTASSIUM: 4.7 mmol/L (ref 3.5–5.1)
Sodium: 136 mmol/L (ref 135–145)

## 2017-01-04 LAB — PROTIME-INR
INR: 2.64
Prothrombin Time: 28.7 seconds — ABNORMAL HIGH (ref 11.4–15.2)

## 2017-01-04 LAB — ECHOCARDIOGRAM COMPLETE
HEIGHTINCHES: 70 in
WEIGHTICAEL: 5044.8 [oz_av]

## 2017-01-04 MED ORDER — WARFARIN SODIUM 2.5 MG PO TABS
2.5000 mg | ORAL_TABLET | Freq: Once | ORAL | Status: AC
  Administered 2017-01-04: 2.5 mg via ORAL
  Filled 2017-01-04: qty 1

## 2017-01-04 MED ORDER — BISACODYL 10 MG RE SUPP: 10.0000 mg | Freq: Every day | RECTAL | Status: DC | PRN

## 2017-01-04 MED ORDER — PERFLUTREN LIPID MICROSPHERE
1.0000 mL | INTRAVENOUS | Status: AC | PRN
  Administered 2017-01-04: 2 mL via INTRAVENOUS
  Filled 2017-01-04: qty 10

## 2017-01-04 MED ORDER — SENNA 8.6 MG PO TABS: 1.0000 | ORAL_TABLET | Freq: Every day | ORAL | Status: DC | PRN

## 2017-01-04 NOTE — Progress Notes (Signed)
ANTICOAGULATION CONSULT NOTE - Follow-Up Consult  Pharmacy Consult for Warfarin Indication: atrial fibrillation  Allergies  Allergen Reactions  . Amiodarone Other (See Comments)    hyperthyroidism  . Pravastatin Sodium Other (See Comments)    myalgia  . Simvastatin Other (See Comments)    severe leg cramps  . Statins Other (See Comments)    myalgia  . Tape Other (See Comments)    Skin Tears Use Paper Tape Only    Patient Measurements: Height: 5\' 10"  (177.8 cm) Weight: (!) 315 lb 4.8 oz (143 kg) IBW/kg (Calculated) : 73  Vital Signs: Temp: 97.4 F (36.3 C) (03/23 0546) BP: 108/76 (03/23 0546) Pulse Rate: 63 (03/23 0546)  Labs:  Recent Labs  01/02/17 1001 01/03/17 0412 01/04/17 0514  LABPROT 27.4* 28.5* 28.7*  INR 2.49 2.62 2.64  CREATININE 1.11 1.19 1.32*    Estimated Creatinine Clearance (by C-G formula based on SCr of 1.32 mg/dL (H)) Male: 68.6 mL/min (A) Male: 82.9 mL/min (A)  Assessment: Patient is a 61 yo M admitted 3/19 w/ worsening shortness of breath and weakness. Patient is on warfarin PTA for a fib. Pharmacy was consulted to dose while inpatient.   PTA warfarin dose: 2.5mg  Mon and Fri, 5mg  all other days per patient discussion and chart review  INR therapeutic   Goal of Therapy:  INR 2-3 Monitor platelets by anticoagulation protocol: Yes   Plan:  Repeat Warfarin 2.5mg  PO x1 tonight Daily INR Monitor signs/symptoms of bleeding  Thank you Anette Guarneri, PharmD [phone] 01/04/2017 8:57 AM

## 2017-01-04 NOTE — Progress Notes (Signed)
Progress Note  Patient Name: LINDWOOD MOGEL Date of Encounter: 01/04/2017  Primary Cardiologist: Dr [name]  Patient Profile     61 y.o. male w/ hx  S-CHF, COPD, PAF on coumadin, s/p ILR, HTN, HLD, BPH, and GERD. Admitted 03/19 w/ CAP/CHF, cards saw 03/22 for CHF  Subjective   Pt not breathing any better today. No po intake recorded 03/22 7a-7p but pt states he had been drinking too much so changed to very little liquids and ice chips.   Inpatient Medications    Scheduled Meds: . carisoprodol  350 mg Oral TID  . carvedilol  12.5 mg Oral BID WC  . diltiazem  120 mg Oral q morning - 10a  . doxepin  25 mg Oral BID  . fluticasone furoate-vilanterol  1 puff Inhalation Daily  . furosemide  160 mg Intravenous Q12H  . hydrocortisone  1 application Topical BID  . levofloxacin  750 mg Oral Daily  . levothyroxine  25 mcg Oral QAC breakfast  . lidocaine  1 patch Transdermal Q24H  . metolazone  5 mg Oral Daily  . mupirocin cream   Topical BID  . pantoprazole  40 mg Oral Daily  . potassium chloride SA  40 mEq Oral Daily  . sertraline  200 mg Oral QHS  . sodium chloride flush  3 mL Intravenous Q12H  . tamsulosin  0.4 mg Oral QPC breakfast  . Warfarin - Pharmacist Dosing Inpatient   Does not apply q1800  . zolpidem  5 mg Oral QHS,MR X 1   Continuous Infusions:  PRN Meds: acetaminophen **OR** acetaminophen, albuterol, alum & mag hydroxide-simeth, hydrALAZINE, linaclotide, ondansetron, traZODone   Vital Signs    Vitals:   01/03/17 1808 01/03/17 2150 01/04/17 0417 01/04/17 0546  BP: 125/76 96/74  108/76  Pulse: 75 61  63  Resp:  18  18  Temp:  97.7 F (36.5 C)  97.4 F (36.3 C)  TempSrc:      SpO2:  93%  99%  Weight:   (!) 315 lb 4.8 oz (143 kg)   Height:        Intake/Output Summary (Last 24 hours) at 01/04/17 0753 Last data filed at 01/04/17 0548  Gross per 24 hour  Intake              426 ml  Output             3620 ml  Net            -3194 ml   Filed Weights   01/02/17 0639 01/03/17 0449 01/04/17 0417  Weight: (!) 312 lb 14.4 oz (141.9 kg) (!) 315 lb 14.4 oz (143.3 kg) (!) 315 lb 4.8 oz (143 kg)    Telemetry    Atrial fib, rate generally ok, no pauses >2.2 sec - Personally Reviewed  ECG    n/a - Personally Reviewed  Physical Exam   General: Well developed, well nourished, male appearing in no acute distress. Head: Normocephalic, atraumatic.  Neck: Supple without bruits, JVD to jaw. Lungs:  Resp regular and unlabored, rales bases. Heart: Irreg R&R, S1, S2, no S3, S4, or murmur; no rub. Abdomen: Soft, non-tender, non-distended with normoactive bowel sounds. No hepatomegaly. No rebound/guarding. No obvious abdominal masses. Extremities: No clubbing, cyanosis, 2+ edema. Distal pedal pulses are 2+ bilaterally upper extrem. Neuro: Alert and oriented X 3. Moves all extremities spontaneously. Psych: Normal affect.  Labs    Hematology  Recent Labs Lab 12/31/16 1136 01/01/17 0539  WBC  6.9 6.9  RBC 4.27 4.16*  HGB 12.5* 12.2*  HCT 39.7 38.4*  MCV 93.0 92.3  MCH 29.3 29.3  MCHC 31.5 31.8  RDW 15.2 15.0  PLT 159 176    Chemistry  Recent Labs Lab 01/02/17 1001 01/03/17 0412 01/04/17 0514  NA 138 134* 136  K 3.8 4.5 4.7  CL 100* 97* 96*  CO2 28 26 31   GLUCOSE 153* 94 84  BUN 17 23* 23*  CREATININE 1.11 1.19 1.32*  CALCIUM 8.4* 8.7* 8.5*  GFRNONAA >60 >60 56*  GFRAA >60 >60 >60  ANIONGAP 10 11 9      Cardiac Enzymes  Recent Labs Lab 12/31/16 1136  TROPONINI <0.03     BNP  Recent Labs Lab 12/31/16 1136  BNP 395.2*    Radiology    Dg Chest 2 View Result Date: 12/31/2016 CLINICAL DATA:  Shortness of breath with lower extremity and upper extremity edema EXAM: CHEST  2 VIEW COMPARISON:  August 31, 2015 FINDINGS: There is a small right pleural effusion with patchy focal opacity in the posterior right base, felt to represent a small area of pneumonia. Lungs elsewhere are clear. There is cardiomegaly with pulmonary  venous hypertension. No adenopathy. There is a loop recorder on the left. There is degenerative change in the thoracic spine. IMPRESSION: Underlying pulmonary vascular congestion. Small focus of infiltrate posterior right base with small right pleural effusion. Suspect localized pneumonia in this area. No evident adenopathy. Followup PA and lateral chest radiographs recommended in 3-4 weeks following trial of antibiotic therapy to ensure resolution and exclude underlying malignancy. Electronically Signed   By: Lowella Grip III M.D.   On: 12/31/2016 13:00     Cardiac Studies   ECHO: 01/04/2017 ordered  Patient Profile     61 year old male with past medical history of nonischemic cardiomyopathy, chronic systolic congestive heart failure, permanent atrial fibrillation, prior pacemaker placement, COPD, hypertension, hyperlipidemia, obesity with acute on chronic systolic congestive heart failure. Last echocardiogram November 2016 showed ejection fraction 30-35%, severe biatrial enlargement, moderate right ventricular enlargement and mildly reduced RV function. Cardiac catheterization June 2017 showed a small D1 ostial 80% lesion but otherwise no significant coronary disease. Ejection fraction 30%.    Assessment & Plan    Principal Problem:   CAP (community acquired pneumonia) - per IM - CXR report recommends PA/Lat CXR in 3-4 weeks, told pt this.    Acute on chronic diastolic CHF - Pt weight is up about 15 lbs from baseline -  Lasix 80 mg>>160 mg IV starting 03/22 pm - renal function is trending up but still ok - starting 03/23, on metolazone 5 mg qd before the am Lasix, continue at 160 mg IV bid. - if this does not work, try Bumex. - emphasize writing daily weights down and compliance with 2000 mg  Na diet  and < 1.5L liquids daily. - L. Servando Snare, NP to see 04/03 in follow up,  to more aggressively manage CHF    Permanent atrial fib - Not sure Diltiazem is the best medication for  rate control since EF is low -  Bisoprolol was changed to Coreg - once off the Dilt, could increase the Coreg to see if that works for rate control. - timing of stopping the Dilt per MD  Otherwise, per IM Active Problems:   Essential hypertension   GERD   Anticoagulated on warfarin   COPD (chronic obstructive pulmonary disease) ([facility])   Hyperlipidemia   Atrial fibrillation ([place])   Acute on  chronic respiratory failure ([place])    Signed, Lenoard Aden 7:53 AM 01/04/2017 Pager: [phone] As above, patient seen and examined. Mild improvement in dyspnea. No chest pain. Previous EF 30-35. FU echo this admission pending. Good diuresis yesterday (-[id number]). Will continue present dose of Lasix and metolazone. Follow renal function closely. Patient's heart rate appears to be controlled on carvedilol and lower dose Cardizem. I will discontinue Cardizem and follow heart rate. If it increases we will increase carvedilol or add digoxin given reduced LV function. Once it is clear his renal function is stable would add low-dose ACE inhibitor as well. Once his medications are fully titrated he will need repeat echocardiogram 3 months later. If ejection fraction less than 35% would need ICD.   Kirk Ruths, MD

## 2017-01-04 NOTE — Care Management Important Message (Signed)
Important Message  Patient Details  Name: [name] MRN: [id number] Date of Birth: [birth year]   Medicare Important Message Given:  Yes    Tannor Pyon Abena 01/04/2017, 11:59 AM

## 2017-01-04 NOTE — Progress Notes (Signed)
PT Cancellation Note  Patient Details Name: [name] MRN: [id number] DOB: 07-Sep-1956   Cancelled Treatment:    Reason Eval/Treat Not Completed: Medical issues which prohibited therapy.  Very high BP this afternoon, read 142/123 in L arm.  Nursing notified and will see in the AM.   Ramond Dial 01/04/2017, 3:44 PM   Mee Hives, PT MS Acute Rehab Dept. Number: [place] [facility] [place]

## 2017-01-04 NOTE — Progress Notes (Signed)
PROGRESS NOTE  [name]  DPO:[id number] DOB: 22-May-1956 DOA: 12/31/2016 PCP: [name], MD   Brief Narrative: [name] is a 61 y.o. male with a history of diastolic CHF with last TEE in 2016 demonstrating EF 50-55%, asthma, COPD, chronic respiratory failure on intermittent use of nocturnal oxygen, atrial fibrillation - s/p DCCV in 2016, on coumadin, s/p PPM, HTN, HLD, BPH, and GERD who presented to the emergency department with complaints of progressive worsening of shortness of breath and weakness present for weeks worsening over previous 48 hours, having woken up feeling smothered. He called EMS, but refused transport due to cost, and later had cardiology remotely interrogate PPM. They found an accelerated heart rate 170 bpm and referred him to the ED for further evaluation. Upon arrival he was tachypneic with respirations of 22 with heart rate was 145 and EKG showed atopy with RBBB and left fascicular block, and nonspecific ST- T wave abnormality He was hypoxic, improved on 3 L O2. Blood work was unremarkable, CXR showed posterior right base infiltrate with small right pleural effusion suspicious for pneumonia superimposed on underlying pulmonary vascular congestion. The patient was admitted for acute on chronic respiratory failure secondary to CAP and decompensated CHF resulting in Afib w/ RVR. Rate has been well-controlled. Lasix 80mg  IV BID was started with limited diuresis, so this was augmented. Cardiology has been assisting with diuresis.   Assessment & Plan: Principal Problem:   CAP (community acquired pneumonia) Active Problems:   Essential hypertension   GERD   Anticoagulated on warfarin   COPD (chronic obstructive pulmonary disease) ([facility])   Hyperlipidemia   Atrial fibrillation ([facility])   Acute on chronic respiratory failure ([facility])  Acute on chronic hypoxemic respiratory failure: Multifactorial, associated with CAP, CHF exacerbation superimposed on COPD. Patient uses nocturnal  O2 at home intermittently. Currently O2 requirement increased to continuous O2 at 3L - Continue to treat underlying issues and wean off O2 as tolerated  CAP: RLL with associated small effusion. Strep pneumo Ag negative, HIV NR.  - No leukocytosis or fever > transition to po abx to complete 7 days. - Continue incentive spirometry, expectorant prn - Monitor blood cultures - Recommend follow up CXR as outpatient to ensure resolution of infiltrate and effusion.   Acute on chronic CHF: Due to decreasing torsemide dose to 60mg  daily (instead of BID) at home due to frequent urination. EDW appears to be ~302lbs. 310lbs at admission. BNP 395. Tn neg.  - Holding torsemide and started lasix 80 mg IV BID > up to 120mg  IV with poor UOP > 160mg  IV BID + metolazone daily prior to lasix: Monitor renal function closely.  - Appreciate cardiology input.  - Stop CCB w/low EF. Consider increase coreg vs. add digoxin if rate becomes uncontrolled.  - Would ideally add low dose ACE inhibitor once renal function is steady.  - Daily I/O and daily weights. - Update echocardiogram ordered. Will need repeat echo 3 months after medication optimization and AICD placement if EF remains <35%.  Afib: Currently rate controlled. - Continue coumadin per pharmacy. INR 1.96 on admission. - Continue telemetry.  - Keep K > 4, repleting daily. Will hold as it is stable >4 and Creatinine rising. - Changed nebivolol to coreg per cardiology and stopped diltiazem due to low EF.   GERD: Chronic, stable - Continue PPI  Hypertension: Chronic, stable - Continue diuresis, coreg. Stopped dilt as above.  - Hydralazine prn ordered.   Dermatitis: Pruritic, nonspecific. Has been biopsied by dermatology  as outpatient. No disease-specific therapy was advised. No evidence of bacterial infection. Superficial cultures were ordered at admission, showing MSSA, not thought to be true infection.  - Topical treatments, most important  intervention is aimed at preventing scratching (dressings working well).  Hypothyroidism: most recent TSH 1.48 on 11/29/2016 - Continue home synthroid  Muscle spasms: Reordered home medications. Stable.  BPH:  - Restarted flomax, still with weak stream per pt. Not retaining per bladder scan 3/22.   DVT prophylaxis: Coumadin Code Status: Full Family Communication: None at bedside this AM. Disposition Plan: [place] home with girlfriend once improving.    Consultants:   Cardiology  Procedures:   None  Antimicrobials:  Azithromycin 3/19 >> 3/23  Ceftriaxone 3/19 >> 3/23  Levaquin 3/23 > 3/26  Subjective: Shortness of breath no better today. Denies chest pain or palpitations. Leg swelling stable, worse than baseline, though he's been propping them up.   Objective: Vitals:   01/03/17 2150 01/04/17 0417 01/04/17 0546 01/04/17 1416  BP: 96/74  108/76 93/72  Pulse: 61  63 64  Resp: 18  18 16   Temp: 97.7 F (36.5 C)  97.4 F (36.3 C) 97.8 F (36.6 C)  TempSrc:    Oral  SpO2: 93%  99% 99%  Weight:  (!) 143 kg (315 lb 4.8 oz)    Height:        Intake/Output Summary (Last 24 hours) at 01/04/17 1535 Last data filed at 01/04/17 0548  Gross per 24 hour  Intake              426 ml  Output             3020 ml  Net            -2594 ml   Filed Weights   01/02/17 0639 01/03/17 0449 01/04/17 0417  Weight: (!) 141.9 kg (312 lb 14.4 oz) (!) 143.3 kg (315 lb 14.4 oz) (!) 143 kg (315 lb 4.8 oz)    Examination: General exam: Morbidly obese, pleasant male in no distress Respiratory system: Non-labored breathing on 3L by [place]. Bibasilar crackles somewhat improved and diminished at right base.  Cardiovascular system: Irreg irreg. No murmur, rub, or gallop. No JVD, and 2+ pitting LE edema unchanged again today. Gastrointestinal system: Abdomen soft, non-tender, non-distended, with normoactive bowel sounds. No organomegaly or masses felt. Central nervous system: Alert and  oriented. No focal neurological deficits. Extremities: Warm, no deformities Skin: Scattered nonconfluent erythematous-violaceous papules, some excoriated and draining serous fluid x3 on mid back, improving with c/d/I dressings. No purulence, induration, or fluctuance.  Psychiatry: Judgement and insight appear normal. Mood & affect appropriate.   Data Reviewed: I have personally reviewed following labs and imaging studies  CBC:  Recent Labs Lab 12/31/16 1136 01/01/17 0539  WBC 6.9 6.9  HGB 12.5* 12.2*  HCT 39.7 38.4*  MCV 93.0 92.3  PLT 159 889   Basic Metabolic Panel:  Recent Labs Lab 12/31/16 1136 01/01/17 0539 01/02/17 1001 01/03/17 0412 01/04/17 0514  NA 140 143 138 134* 136  K 4.4 3.8 3.8 4.5 4.7  CL 105 105 100* 97* 96*  CO2 28 27 28 26 31   GLUCOSE 95 119* 153* 94 84  BUN 7 10 17  23* 23*  CREATININE 0.78 1.02 1.11 1.19 1.32*  CALCIUM 8.8* 9.1 8.4* 8.7* 8.5*   GFR: Estimated Creatinine Clearance (by C-G formula based on SCr of 1.32 mg/dL (H)) Male: 68.6 mL/min (A) Male: 82.9 mL/min (A) Liver Function Tests: No  results for input(s): AST, ALT, ALKPHOS, BILITOT, PROT, ALBUMIN in the last 168 hours. No results for input(s): LIPASE, AMYLASE in the last 168 hours. No results for input(s): AMMONIA in the last 168 hours. Coagulation Profile:  Recent Labs Lab 12/31/16 1902 01/01/17 0539 01/02/17 1001 01/03/17 0412 01/04/17 0514  INR 1.96 2.04 2.49 2.62 2.64   Cardiac Enzymes:  Recent Labs Lab 12/31/16 1136  TROPONINI <0.03   BNP (last 3 results) No results for input(s): PROBNP in the last 8760 hours. HbA1C: No results for input(s): HGBA1C in the last 72 hours. CBG: No results for input(s): GLUCAP in the last 168 hours. Lipid Profile: No results for input(s): CHOL, HDL, LDLCALC, TRIG, CHOLHDL, LDLDIRECT in the last 72 hours. Thyroid Function Tests: No results for input(s): TSH, T4TOTAL, FREET4, T3FREE, THYROIDAB in the last 72 hours. Anemia  Panel: No results for input(s): VITAMINB12, FOLATE, FERRITIN, TIBC, IRON, RETICCTPCT in the last 72 hours. Urine analysis:    Component Value Date/Time   COLORURINE YELLOW 11/29/2016 1518   APPEARANCEUR CLEAR 11/29/2016 1518   LABSPEC 1.020 11/29/2016 1518   PHURINE 6.0 11/29/2016 1518   GLUCOSEU NEGATIVE 11/29/2016 1518   HGBUR SMALL (A) 11/29/2016 1518   BILIRUBINUR NEGATIVE 11/29/2016 1518   KETONESUR NEGATIVE 11/29/2016 1518   PROTEINUR NEGATIVE 06/07/2014 1031   UROBILINOGEN 0.2 11/29/2016 1518   NITRITE NEGATIVE 11/29/2016 1518   LEUKOCYTESUR NEGATIVE 11/29/2016 1518   Recent Results (from the past 240 hour(s))  Culture, blood (routine x 2) Call MD if unable to obtain prior to antibiotics being given     Status: None (Preliminary result)   Collection Time: 12/31/16  6:58 PM  Result Value Ref Range Status   Specimen Description BLOOD RIGHT ANTECUBITAL  Final   Special Requests BOTTLES DRAWN AEROBIC ONLY 5CC  Final   Culture NO GROWTH 4 DAYS  Final   Report Status PENDING  Incomplete  Culture, blood (routine x 2) Call MD if unable to obtain prior to antibiotics being given     Status: None (Preliminary result)   Collection Time: 12/31/16  7:07 PM  Result Value Ref Range Status   Specimen Description BLOOD LEFT ANTECUBITAL  Final   Special Requests BOTTLES DRAWN AEROBIC AND ANAEROBIC 5CC  Final   Culture NO GROWTH 4 DAYS  Final   Report Status PENDING  Incomplete  Aerobic Culture (superficial specimen)     Status: None   Collection Time: 12/31/16 11:32 PM  Result Value Ref Range Status   Specimen Description BACK  Final   Special Requests Normal  Final   Gram Stain   Final    NO WBC SEEN RARE SQUAMOUS EPITHELIAL CELLS PRESENT NO ORGANISMS SEEN    Culture FEW STAPHYLOCOCCUS AUREUS  Final   Report Status 01/03/2017 FINAL  Final   Organism ID, Bacteria STAPHYLOCOCCUS AUREUS  Final      Susceptibility   Staphylococcus aureus - MIC*    CIPROFLOXACIN <=0.5 SENSITIVE  Sensitive     ERYTHROMYCIN RESISTANT Resistant     GENTAMICIN <=0.5 SENSITIVE Sensitive     OXACILLIN 0.5 SENSITIVE Sensitive     TETRACYCLINE <=1 SENSITIVE Sensitive     VANCOMYCIN <=0.5 SENSITIVE Sensitive     TRIMETH/SULFA <=10 SENSITIVE Sensitive     CLINDAMYCIN RESISTANT Resistant     RIFAMPIN <=0.5 SENSITIVE Sensitive     Inducible Clindamycin POSITIVE Resistant     * FEW STAPHYLOCOCCUS AUREUS      Radiology Studies: No results found.  Scheduled Meds: . carisoprodol  350 mg Oral TID  . carvedilol  12.5 mg Oral BID WC  . doxepin  25 mg Oral BID  . fluticasone furoate-vilanterol  1 puff Inhalation Daily  . furosemide  160 mg Intravenous Q12H  . hydrocortisone  1 application Topical BID  . levofloxacin  750 mg Oral Daily  . levothyroxine  25 mcg Oral QAC breakfast  . lidocaine  1 patch Transdermal Q24H  . metolazone  5 mg Oral Daily  . mupirocin cream   Topical BID  . pantoprazole  40 mg Oral Daily  . potassium chloride SA  40 mEq Oral Daily  . sertraline  200 mg Oral QHS  . sodium chloride flush  3 mL Intravenous Q12H  . tamsulosin  0.4 mg Oral QPC breakfast  . warfarin  2.5 mg Oral ONCE-1800  . Warfarin - Pharmacist Dosing Inpatient   Does not apply q1800  . zolpidem  5 mg Oral QHS,MR X 1   Continuous Infusions:   LOS: 4 days   Time spent: 25 minutes.  Vance Gather, MD [facility] Pager [phone]  If 7PM-7AM, please contact night-coverage [email or web address] Password [facility] 01/04/2017, 3:35 PM

## 2017-01-05 DIAGNOSIS — I5023 Acute on chronic systolic (congestive) heart failure: Secondary | ICD-10-CM

## 2017-01-05 DIAGNOSIS — I5022 Chronic systolic (congestive) heart failure: Secondary | ICD-10-CM

## 2017-01-05 LAB — BASIC METABOLIC PANEL
Anion gap: 10 (ref 5–15)
BUN: 31 mg/dL — ABNORMAL HIGH (ref 6–20)
CALCIUM: 9.1 mg/dL (ref 8.9–10.3)
CO2: 36 mmol/L — ABNORMAL HIGH (ref 22–32)
Chloride: 90 mmol/L — ABNORMAL LOW (ref 101–111)
Creatinine, Ser: 1.38 mg/dL — ABNORMAL HIGH (ref 0.61–1.24)
GFR calc Af Amer: >60 mL/min (ref >60)
GFR, EST NON AFRICAN AMERICAN: 53 mL/min — AB (ref >60)
GLUCOSE: 91 mg/dL (ref 65–99)
Potassium: 3.8 mmol/L (ref 3.5–5.1)
SODIUM: 136 mmol/L (ref 135–145)

## 2017-01-05 LAB — CULTURE, BLOOD (ROUTINE X 2)
CULTURE: NO GROWTH
Culture: NO GROWTH

## 2017-01-05 LAB — PROTIME-INR
INR: 2.06
PROTHROMBIN TIME: 23.6 s — AB (ref 11.4–15.2)

## 2017-01-05 MED ORDER — BARRIER CREAM NON-SPECIFIED
1.0000 "application " | TOPICAL_CREAM | Freq: Two times a day (BID) | TOPICAL | Status: DC | PRN
  Filled 2017-01-05: qty 1

## 2017-01-05 MED ORDER — SACCHAROMYCES BOULARDII 250 MG PO CAPS
250.0000 mg | ORAL_CAPSULE | Freq: Two times a day (BID) | ORAL | Status: DC
  Administered 2017-01-05 – 2017-01-08 (×7): 250 mg via ORAL
  Filled 2017-01-05 (×7): qty 1

## 2017-01-05 MED ORDER — WARFARIN SODIUM 5 MG PO TABS
5.0000 mg | ORAL_TABLET | Freq: Once | ORAL | Status: AC
  Administered 2017-01-05: 5 mg via ORAL
  Filled 2017-01-05: qty 1

## 2017-01-05 NOTE — Progress Notes (Signed)
ANTICOAGULATION CONSULT NOTE - Follow-Up Consult  Pharmacy Consult for Warfarin Indication: atrial fibrillation  Allergies  Allergen Reactions  . Amiodarone Other (See Comments)    hyperthyroidism  . Pravastatin Sodium Other (See Comments)    myalgia  . Simvastatin Other (See Comments)    severe leg cramps  . Statins Other (See Comments)    myalgia  . Tape Other (See Comments)    Skin Tears Use Paper Tape Only    Patient Measurements: Height: 5\' 10"  (177.8 cm) Weight: (!) 314 lb (142.4 kg) IBW/kg (Calculated) : 73  Vital Signs: Temp: 97.6 F (36.4 C) (03/24 0700) BP: 117/59 (03/24 0700) Pulse Rate: 82 (03/24 0700)  Labs:  Recent Labs  01/03/17 0412 01/04/17 0514 01/05/17 0421  LABPROT 28.5* 28.7* 23.6*  INR 2.62 2.64 2.06  CREATININE 1.19 1.32* 1.38*    Estimated Creatinine Clearance (by C-G formula based on SCr of 1.38 mg/dL (H)) Male: 65.5 mL/min (A) Male: 79.1 mL/min (A)  Assessment: Patient is a 61 yo M admitted 3/19 w/ worsening shortness of breath and weakness. Patient is on warfarin PTA for a fib. Pharmacy was consulted to dose while inpatient.   PTA warfarin dose: 2.5mg  Mon and Fri, 5mg  all other days per patient discussion and chart review  INR therapeutic   Goal of Therapy:  INR 2-3 Monitor platelets by anticoagulation protocol: Yes   Plan:  Warfarin 5 mg po x 1 today Daily INR Monitor signs/symptoms of bleeding  Thank you Anette Guarneri, PharmD [phone] 01/05/2017 12:18 PM

## 2017-01-05 NOTE — Progress Notes (Signed)
PROGRESS NOTE  [name]  KVQ:[id number] DOB: Apr 18, 1956 DOA: 12/31/2016 PCP: [name], MD   Brief Narrative: [name] is a 61 y.o. male with a history of diastolic CHF with last TEE in 2016 demonstrating EF 50-55%, asthma, COPD, chronic respiratory failure on intermittent use of nocturnal oxygen, atrial fibrillation - s/p DCCV in 2016, on coumadin, s/p PPM, HTN, HLD, BPH, and GERD who presented to the emergency department with complaints of progressive worsening of shortness of breath and weakness present for weeks worsening over previous 48 hours, having woken up feeling smothered. He called EMS, but refused transport due to cost, and later had cardiology remotely interrogate PPM. They found an accelerated heart rate 170 bpm and referred him to the ED for further evaluation. Upon arrival he was tachypneic with respirations of 22 with heart rate was 145 and EKG showed atopy with RBBB and left fascicular block, and nonspecific ST- T wave abnormality He was hypoxic, improved on 3 L O2. Blood work was unremarkable, CXR showed posterior right base infiltrate with small right pleural effusion suspicious for pneumonia superimposed on underlying pulmonary vascular congestion. The patient was admitted for acute on chronic respiratory failure secondary to CAP and decompensated CHF resulting in Afib w/ RVR. Rate has been well-controlled. Lasix 80mg  IV BID was started with limited diuresis, so this was augmented. Cardiology has been assisting with diuresis.   Assessment & Plan: Principal Problem:   CAP (community acquired pneumonia) Active Problems:   Essential hypertension   GERD   Anticoagulated on warfarin   COPD (chronic obstructive pulmonary disease) (HCC)   Hyperlipidemia   Atrial fibrillation (HCC)   Acute on chronic respiratory failure (HCC)  Acute on chronic hypoxemic respiratory failure: Multifactorial, associated with CAP, CHF exacerbation superimposed on COPD. Patient uses nocturnal  O2 at home intermittently. Currently O2 requirement increased to continuous O2 at 3L - Continue to treat underlying issues and wean off O2 as tolerated  CAP: RLL with associated small effusion. Strep pneumo Ag negative, HIV NR.  - No leukocytosis or fever > transition to po abx to complete 7 days. - Continue incentive spirometry, expectorant prn - Monitor blood cultures - Recommend follow up CXR as outpatient to ensure resolution of infiltrate and effusion.   Acute on chronic CHF: Due to decreasing torsemide dose to 60mg  daily (instead of BID) at home due to frequent urination. EDW appears to be ~302lbs. 310lbs at admission. BNP 395. Tn neg.  - Holding torsemide and started lasix 80 mg IV BID > up to 120mg  IV with poor UOP > 160mg  IV BID + metolazone daily. Will decide on continuing metolazone in AM.  - Monitor renal function closely.  - Appreciate cardiology input.  - Stop CCB w/low EF. Consider increase coreg vs. add digoxin if rate becomes uncontrolled.  - Would ideally add low dose ACE inhibitor once renal function is steady.  - Daily I/O and daily weights. - Update echocardiogram showed interval worsening. Compared to the prior study on 08/29/2015 LVEF has decreased from 30-35% to 10-15%. RVEF is also severely decreased. - Will need repeat echo 3 months after medication optimization and AICD placement if EF remains <35%.  Afib: Currently rate controlled. - Continue coumadin per pharmacy. INR 1.96 on admission. - Continue telemetry.  - Keep K > 4, repleting daily. Will hold as it is stable >4 and Creatinine rising. - Changed nebivolol to coreg per cardiology and stopped diltiazem due to low EF.   GERD: Chronic, stable - Continue  PPI  Hypertension: Chronic, stable - Continue diuresis, coreg. Stopped dilt as above.  - Hydralazine prn ordered.   Dermatitis: Pruritic, nonspecific. Has been biopsied by dermatology as outpatient. No disease-specific therapy was advised. No  evidence of bacterial infection. Superficial cultures were ordered at admission, showing MSSA, not thought to be true infection.  - Topical treatments, most important intervention is aimed at preventing scratching (dressings working well).  Hypothyroidism: most recent TSH 1.48 on 11/29/2016 - Continue home synthroid  Muscle spasms: Reordered home medications. Stable.  BPH:  - Restarted flomax, still with weak stream per pt. Not retaining per bladder scan 3/22.   DVT prophylaxis: Coumadin Code Status: Full Family Communication: None at bedside this AM. Disposition Plan: [place] home with girlfriend once improving.    Consultants:   Cardiology  Procedures:   ECHOCARDIOGRAM 01/04/2017: - Left ventricle: The cavity size was severely dilated. There was   mild concentric hypertrophy. Systolic function was severely   reduced. The estimated ejection fraction was in the range of   10-15%. Diffuse hypokinesis. The study was not technically   sufficient to allow evaluation of LV diastolic dysfunction due to   atrial fibrillation. - Ventricular septum: Septal motion showed paradox. - Aortic valve: There was trivial regurgitation. - Mitral valve: There was mild regurgitation. - Left atrium: The atrium was severely dilated. - Right ventricle: The cavity size was moderately dilated. Wall   thickness was normal. Systolic function was severely reduced. - Right atrium: The atrium was severely dilated. - Tricuspid valve: There was moderate regurgitation. - Pulmonary arteries: Systolic pressure was moderately increased.   PA peak pressure: 45 mm Hg (S). - Impressions: Compared to the prior study on 08/29/2015 LVEF has   decreased from 30-35% to 10-15%.   RVEF is also severely decreased.  Impressions: - Compared to the prior study on 08/29/2015 LVEF has decreased from   30-35% to 10-15%.   RVEF is also severely decreased.  Antimicrobials:  Azithromycin 3/19 >> 3/23  Ceftriaxone  3/19 >> 3/23  Levaquin 3/23 > 3/26  Subjective: Shortness of breath somewhat improved, not at baseline. Denies chest pain or palpitations.  Objective: Vitals:   01/04/17 1416 01/05/17 0024 01/05/17 0500 01/05/17 0700  BP: 93/72 102/74  (!) 117/59  Pulse: 64 (!) 48  82  Resp: 16 18  18   Temp: 97.8 F (36.6 C) 97.6 F (36.4 C)  97.6 F (36.4 C)  TempSrc: Oral     SpO2: 99% 92%  99%  Weight:   (!) 142.4 kg (314 lb)   Height:        Intake/Output Summary (Last 24 hours) at 01/05/17 0801 Last data filed at 01/04/17 1700  Gross per 24 hour  Intake                0 ml  Output             2190 ml  Net            -2190 ml   Filed Weights   01/03/17 0449 01/04/17 0417 01/05/17 0500  Weight: (!) 143.3 kg (315 lb 14.4 oz) (!) 143 kg (315 lb 4.8 oz) (!) 142.4 kg (314 lb)    Examination: General exam: Morbidly obese, pleasant male in no distress Respiratory system: Non-labored breathing on 3L by [place]. Bibasilar crackles somewhat improved and diminished at right base.  Cardiovascular system: Irreg irreg. No murmur, rub, or gallop. No JVD, and 1+ pitting LE edema unchanged again today. Gastrointestinal system:  Abdomen soft, non-tender, non-distended, with normoactive bowel sounds. No organomegaly or masses felt. Central nervous system: Alert and oriented. No focal neurological deficits. Extremities: Warm, no deformities Skin: Scattered nonconfluent erythematous-violaceous papules, some excoriated and draining serous fluid x3 on mid back, improving with c/d/I dressings. No purulence, induration, or fluctuance.  Psychiatry: Judgement and insight appear normal. Mood & affect appropriate.   Data Reviewed: I have personally reviewed following labs and imaging studies  CBC:  Recent Labs Lab 12/31/16 1136 01/01/17 0539  WBC 6.9 6.9  HGB 12.5* 12.2*  HCT 39.7 38.4*  MCV 93.0 92.3  PLT 159 732   Basic Metabolic Panel:  Recent Labs Lab 01/01/17 0539 01/02/17 1001 01/03/17 0412  01/04/17 0514 01/05/17 0421  NA 143 138 134* 136 136  K 3.8 3.8 4.5 4.7 3.8  CL 105 100* 97* 96* 90*  CO2 27 28 26 31  36*  GLUCOSE 119* 153* 94 84 91  BUN 10 17 23* 23* 31*  CREATININE 1.02 1.11 1.19 1.32* 1.38*  CALCIUM 9.1 8.4* 8.7* 8.5* 9.1   GFR: Estimated Creatinine Clearance (by C-G formula based on SCr of 1.38 mg/dL (H)) Male: 65.5 mL/min (A) Male: 79.1 mL/min (A) Liver Function Tests: No results for input(s): AST, ALT, ALKPHOS, BILITOT, PROT, ALBUMIN in the last 168 hours. No results for input(s): LIPASE, AMYLASE in the last 168 hours. No results for input(s): AMMONIA in the last 168 hours. Coagulation Profile:  Recent Labs Lab 01/01/17 0539 01/02/17 1001 01/03/17 0412 01/04/17 0514 01/05/17 0421  INR 2.04 2.49 2.62 2.64 2.06   Cardiac Enzymes:  Recent Labs Lab 12/31/16 1136  TROPONINI <0.03   BNP (last 3 results) No results for input(s): PROBNP in the last 8760 hours. HbA1C: No results for input(s): HGBA1C in the last 72 hours. CBG: No results for input(s): GLUCAP in the last 168 hours. Lipid Profile: No results for input(s): CHOL, HDL, LDLCALC, TRIG, CHOLHDL, LDLDIRECT in the last 72 hours. Thyroid Function Tests: No results for input(s): TSH, T4TOTAL, FREET4, T3FREE, THYROIDAB in the last 72 hours. Anemia Panel: No results for input(s): VITAMINB12, FOLATE, FERRITIN, TIBC, IRON, RETICCTPCT in the last 72 hours. Urine analysis:    Component Value Date/Time   COLORURINE YELLOW 11/29/2016 1518   APPEARANCEUR CLEAR 11/29/2016 1518   LABSPEC 1.020 11/29/2016 1518   PHURINE 6.0 11/29/2016 1518   GLUCOSEU NEGATIVE 11/29/2016 1518   HGBUR SMALL (A) 11/29/2016 1518   BILIRUBINUR NEGATIVE 11/29/2016 1518   KETONESUR NEGATIVE 11/29/2016 1518   PROTEINUR NEGATIVE 06/07/2014 1031   UROBILINOGEN 0.2 11/29/2016 1518   NITRITE NEGATIVE 11/29/2016 1518   LEUKOCYTESUR NEGATIVE 11/29/2016 1518   Recent Results (from the past 240 hour(s))  Culture, blood  (routine x 2) Call MD if unable to obtain prior to antibiotics being given     Status: None (Preliminary result)   Collection Time: 12/31/16  6:58 PM  Result Value Ref Range Status   Specimen Description BLOOD RIGHT ANTECUBITAL  Final   Special Requests BOTTLES DRAWN AEROBIC ONLY 5CC  Final   Culture NO GROWTH 4 DAYS  Final   Report Status PENDING  Incomplete  Culture, blood (routine x 2) Call MD if unable to obtain prior to antibiotics being given     Status: None (Preliminary result)   Collection Time: 12/31/16  7:07 PM  Result Value Ref Range Status   Specimen Description BLOOD LEFT ANTECUBITAL  Final   Special Requests BOTTLES DRAWN AEROBIC AND ANAEROBIC 5CC  Final   Culture NO GROWTH  4 DAYS  Final   Report Status PENDING  Incomplete  Aerobic Culture (superficial specimen)     Status: None   Collection Time: 12/31/16 11:32 PM  Result Value Ref Range Status   Specimen Description BACK  Final   Special Requests Normal  Final   Gram Stain   Final    NO WBC SEEN RARE SQUAMOUS EPITHELIAL CELLS PRESENT NO ORGANISMS SEEN    Culture FEW STAPHYLOCOCCUS AUREUS  Final   Report Status 01/03/2017 FINAL  Final   Organism ID, Bacteria STAPHYLOCOCCUS AUREUS  Final      Susceptibility   Staphylococcus aureus - MIC*    CIPROFLOXACIN <=0.5 SENSITIVE Sensitive     ERYTHROMYCIN RESISTANT Resistant     GENTAMICIN <=0.5 SENSITIVE Sensitive     OXACILLIN 0.5 SENSITIVE Sensitive     TETRACYCLINE <=1 SENSITIVE Sensitive     VANCOMYCIN <=0.5 SENSITIVE Sensitive     TRIMETH/SULFA <=10 SENSITIVE Sensitive     CLINDAMYCIN RESISTANT Resistant     RIFAMPIN <=0.5 SENSITIVE Sensitive     Inducible Clindamycin POSITIVE Resistant     * FEW STAPHYLOCOCCUS AUREUS      Radiology Studies: No results found.  Scheduled Meds: . carisoprodol  350 mg Oral TID  . carvedilol  12.5 mg Oral BID WC  . doxepin  25 mg Oral BID  . fluticasone furoate-vilanterol  1 puff Inhalation Daily  . furosemide  160 mg  Intravenous Q12H  . hydrocortisone  1 application Topical BID  . levofloxacin  750 mg Oral Daily  . levothyroxine  25 mcg Oral QAC breakfast  . lidocaine  1 patch Transdermal Q24H  . metolazone  5 mg Oral Daily  . mupirocin cream   Topical BID  . pantoprazole  40 mg Oral Daily  . sertraline  200 mg Oral QHS  . sodium chloride flush  3 mL Intravenous Q12H  . tamsulosin  0.4 mg Oral QPC breakfast  . Warfarin - Pharmacist Dosing Inpatient   Does not apply q1800  . zolpidem  5 mg Oral QHS,MR X 1   Continuous Infusions:   LOS: 5 days   Time spent: 25 minutes.  Vance Gather, MD [facility] Pager [phone]  If 7PM-7AM, please contact night-coverage [email or web address] Password [email or web address] 01/05/2017, 8:01 AM

## 2017-01-05 NOTE — Progress Notes (Addendum)
Subjective:    SOB improving but not resolved  Objective:   Temp:  [97.6 F (36.4 C)-97.8 F (36.6 C)] 97.6 F (36.4 C) (03/24 0700) Pulse Rate:  [48-82] 82 (03/24 0700) Resp:  [16-18] 18 (03/24 0700) BP: (93-117)/(59-74) 117/59 (03/24 0700) SpO2:  [92 %-99 %] 99 % (03/24 0808) Weight:  [314 lb (142.4 kg)] 314 lb (142.4 kg) (03/24 0500) Last BM Date: 01/05/17  Filed Weights   01/03/17 0449 01/04/17 0417 01/05/17 0500  Weight: (!) 315 lb 14.4 oz (143.3 kg) (!) 315 lb 4.8 oz (143 kg) (!) 314 lb (142.4 kg)    Intake/Output Summary (Last 24 hours) at 01/05/17 1150 Last data filed at 01/05/17 0935  Gross per 24 hour  Intake              132 ml  Output             4245 ml  Net            -4113 ml    Telemetry:afib  Exam:  General: NAD  HEENT: sclera clear, throat clear  Resp: mild crackles bilateral bases  Cardiac: irreg, no m/r/g, no jvd  GI: abdomen soft, NT, Nd  MSK: trace bilateral LE edema  Neuro: no focal deficits  Psych: appropriate affect  Lab Results:  Basic Metabolic Panel:  Recent Labs Lab 01/03/17 0412 01/04/17 0514 01/05/17 0421  NA 134* 136 136  K 4.5 4.7 3.8  CL 97* 96* 90*  CO2 26 31 36*  GLUCOSE 94 84 91  BUN 23* 23* 31*  CREATININE 1.19 1.32* 1.38*  CALCIUM 8.7* 8.5* 9.1    Liver Function Tests: No results for input(s): AST, ALT, ALKPHOS, BILITOT, PROT, ALBUMIN in the last 168 hours.  CBC:  Recent Labs Lab 12/31/16 1136 01/01/17 0539  WBC 6.9 6.9  HGB 12.5* 12.2*  HCT 39.7 38.4*  MCV 93.0 92.3  PLT 159 176    Cardiac Enzymes:  Recent Labs Lab 12/31/16 1136  TROPONINI <0.03    BNP: No results for input(s): PROBNP in the last 8760 hours.  Coagulation:  Recent Labs Lab 01/03/17 0412 01/04/17 0514 01/05/17 0421  INR 2.62 2.64 2.06    ECG:   Medications:   Scheduled Medications: . carisoprodol  350 mg Oral TID  . carvedilol  12.5 mg Oral BID WC  . doxepin  25 mg Oral BID  . fluticasone  furoate-vilanterol  1 puff Inhalation Daily  . furosemide  160 mg Intravenous Q12H  . hydrocortisone  1 application Topical BID  . levofloxacin  750 mg Oral Daily  . levothyroxine  25 mcg Oral QAC breakfast  . lidocaine  1 patch Transdermal Q24H  . metolazone  5 mg Oral Daily  . mupirocin cream   Topical BID  . pantoprazole  40 mg Oral Daily  . saccharomyces boulardii  250 mg Oral BID  . sertraline  200 mg Oral QHS  . sodium chloride flush  3 mL Intravenous Q12H  . tamsulosin  0.4 mg Oral QPC breakfast  . Warfarin - Pharmacist Dosing Inpatient   Does not apply q1800  . zolpidem  5 mg Oral QHS,MR X 1     Infusions:   PRN Medications:  acetaminophen **OR** acetaminophen, albuterol, alum & mag hydroxide-simeth, barrier cream, bisacodyl, hydrALAZINE, linaclotide, ondansetron, senna, traZODone  Patient Profile     61 year old male with past medical history of nonischemic cardiomyopathy, chronic systolic congestive heart failure, permanent atrial fibrillation, prior pacemaker  placement, COPD, hypertension, hyperlipidemia, obesity with acute on chronic systolic congestive heart failure. Last echocardiogram November 2016 showed ejection fraction 30-35%, severe biatrial enlargement, moderate right ventricular enlargement and mildly reduced RV function. Cardiac catheterization June 2017 showed a small D1 ostial 80% lesion but otherwise no significant coronary disease. Ejection fraction 30%.     Assessment/Plan    1. Acute on chronic systolic heart failure - echo this admit LVEF 20-25%, severe RV dysfunction, moderate TR, PASP 45 - LVEF decrased from 08/2015 study.  - admited with 10 lbs weight gain. Looks like baseline weight around 310, currently at 314. He reports on his home scale he was down to 302 - has been on high dose diuretics: laasix 160mg  IV bid, metolazone 5mg  daily. Negative 3.7 liters yesterday, negative 6 liters since admission - mild uptrend in Cr.  - medical therapy with  coreg 12.5mg  bid. Consider ACE pending renal function and bp with diuresis.  - continue IV diuresisi today. Dosed metolazone today, will take off MAR and reevaluate tomorrow AM if further dosing needed.   2. Permanent afib - rate controlled with coreg.Dilt stopped in setting of systolic dysfunction - rates reasonable at this time, continue to monitor.  - on coumadin for stroke prevention   Carlyle Dolly, M.D.

## 2017-01-05 NOTE — Evaluation (Signed)
Physical Therapy Evaluation Patient Details Name: [name] MRN: [id number] DOB: [birth year] Today's Date: 01/05/2017   History of Present Illness  [name] is a 61 y.o. male with a history of diastolic CHF with last TEE in 2016 demonstrating EF 50-55%, NICM, asthma, COPD, chronic respiratory failure on intermittent use of nocturnal oxygen, atrial fibrillation - s/p DCCV in 2016, on coumadin, s/p PPM, HTN, HLD, BPH, and GERD who presented to the emergency department with complaints of progressive worsening of shortness of breath and weakness.  Patient with CAP, CHF, and Afib with RVR.  Clinical Impression  Patient presents with problems listed below.  Will benefit from acute PT to maximize functional independence prior to discharge home with family.  Patient reports he is close to his baseline mobility level.  Do not anticipate patient will need any f/u PT at d/c.    Follow Up Recommendations No PT follow up;Supervision - Intermittent    Equipment Recommendations  None recommended by PT    Recommendations for Other Services       Precautions / Restrictions Precautions Precautions: None Restrictions Weight Bearing Restrictions: No      Mobility  Bed Mobility               General bed mobility comments: Patient in recliner.  Reports he sleeps in recliner at home.  Transfers Overall transfer level: Modified independent Equipment used: None             General transfer comment: Patient uses correct hand placement.  Increased time.  No physical assist needed.  Ambulation/Gait Ambulation/Gait assistance: Supervision Ambulation Distance (Feet): 80 Feet Assistive device: None Gait Pattern/deviations: Step-through pattern;Decreased stride length Gait velocity: decreased Gait velocity interpretation: Below normal speed for age/gender General Gait Details: Patient with slow gait, slightly unsteady at times with no assistive device.  No loss of balance.  Minimal  dyspnea with short distance gait.  Stairs            Wheelchair Mobility    Modified Rankin (Stroke Patients Only)       Balance Overall balance assessment: Needs assistance         Standing balance support: No upper extremity supported;During functional activity Standing balance-Leahy Scale: Good Standing balance comment: Slightly unsteady with gait.                             Pertinent Vitals/Pain Pain Assessment: No/denies pain    Home Living Family/patient expects to be discharged to:: Private residence Living Arrangements: Children (Daughter and Son-in-law) Available Help at Discharge: Family;Available PRN/intermittently (Daughter and Son-in-law work) Type of Home: Mobile home Home Access: Stairs to enter Entrance Stairs-Rails: Psychiatric nurse of Steps: [place]: One level Home Equipment: Environmental consultant - 2 wheels;Cane - single point      Prior Function Level of Independence: Independent with assistive device(s)         Comments: Uses cane for ambulation.  Drives     Hand Dominance        Extremity/Trunk Assessment   Upper Extremity Assessment Upper Extremity Assessment: Overall WFL for tasks assessed    Lower Extremity Assessment Lower Extremity Assessment: Generalized weakness ([place] BLE's)       Communication   Communication: No difficulties  Cognition Arousal/Alertness: Awake/alert Behavior During Therapy: WFL for tasks assessed/performed Overall Cognitive Status: Within Functional Limits for tasks assessed  General Comments      Exercises     Assessment/Plan    PT Assessment Patient needs continued PT services  PT Problem List Decreased strength;Decreased activity tolerance;Decreased balance;Decreased mobility;Cardiopulmonary status limiting activity;Obesity       PT Treatment Interventions DME instruction;Gait training;Stair  training;Functional mobility training;Therapeutic activities;Therapeutic exercise;Patient/family education    PT Goals (Current goals can be found in the Care Plan section)  Acute Rehab PT Goals Patient Stated Goal: To get home PT Goal Formulation: With patient Time For Goal Achievement: 01/12/17 Potential to Achieve Goals: Good    Frequency Min 3X/week   Barriers to discharge        Co-evaluation               End of Session Equipment Utilized During Treatment: Oxygen Activity Tolerance: Patient tolerated treatment well;Patient limited by fatigue Patient left: in chair;with call bell/phone within reach   PT Visit Diagnosis: Unsteadiness on feet (R26.81);Other abnormalities of gait and mobility (R26.89);Muscle weakness (generalized) (M62.81)    Time: 3845-3646 PT Time Calculation (min) (ACUTE ONLY): 10 min   Charges:   PT Evaluation $PT Eval Moderate Complexity: 1 Procedure     PT G Codes:        Carita Pian. Sanjuana Kava, [facility] Acute Rehab Services Pager [place] 01/05/2017, 5:15 PM

## 2017-01-06 LAB — BASIC METABOLIC PANEL
ANION GAP: 11 (ref 5–15)
BUN: 30 mg/dL — AB (ref 6–20)
CALCIUM: 9.2 mg/dL (ref 8.9–10.3)
CO2: 38 mmol/L — ABNORMAL HIGH (ref 22–32)
Chloride: 83 mmol/L — ABNORMAL LOW (ref 101–111)
Creatinine, Ser: 1.56 mg/dL — ABNORMAL HIGH (ref 0.61–1.24)
GFR calc Af Amer: 53 mL/min — ABNORMAL LOW (ref >60)
GFR, EST NON AFRICAN AMERICAN: 46 mL/min — AB (ref >60)
GLUCOSE: 99 mg/dL (ref 65–99)
Potassium: 3.1 mmol/L — ABNORMAL LOW (ref 3.5–5.1)
SODIUM: 132 mmol/L — AB (ref 135–145)

## 2017-01-06 LAB — PROTIME-INR
INR: 1.84
Prothrombin Time: 21.5 seconds — ABNORMAL HIGH (ref 11.4–15.2)

## 2017-01-06 MED ORDER — ONDANSETRON HCL 4 MG/2ML IJ SOLN
4.0000 mg | Freq: Four times a day (QID) | INTRAMUSCULAR | Status: DC | PRN
  Administered 2017-01-06: 4 mg via INTRAVENOUS
  Filled 2017-01-06: qty 2

## 2017-01-06 MED ORDER — ALPRAZOLAM 0.5 MG PO TABS
0.5000 mg | ORAL_TABLET | Freq: Once | ORAL | Status: AC
Start: 2017-01-06 — End: 2017-01-06
  Administered 2017-01-06: 0.5 mg via ORAL
  Filled 2017-01-06: qty 1

## 2017-01-06 MED ORDER — TAMSULOSIN HCL 0.4 MG PO CAPS
0.8000 mg | ORAL_CAPSULE | Freq: Every day | ORAL | Status: DC
  Administered 2017-01-07 – 2017-01-08 (×2): 0.8 mg via ORAL
  Filled 2017-01-06 (×2): qty 2

## 2017-01-06 MED ORDER — WARFARIN SODIUM 7.5 MG PO TABS: 7.5000 mg | ORAL_TABLET | Freq: Once | ORAL | Status: DC

## 2017-01-06 MED ORDER — POTASSIUM CHLORIDE CRYS ER 20 MEQ PO TBCR
30.0000 meq | EXTENDED_RELEASE_TABLET | Freq: Two times a day (BID) | ORAL | Status: AC
  Administered 2017-01-06 (×2): 30 meq via ORAL
  Filled 2017-01-06 (×2): qty 1

## 2017-01-06 MED ORDER — FUROSEMIDE 10 MG/ML IJ SOLN
80.0000 mg | Freq: Two times a day (BID) | INTRAMUSCULAR | Status: DC
  Filled 2017-01-06 (×2): qty 8

## 2017-01-06 MED ORDER — SODIUM CHLORIDE 0.9 % IV BOLUS (SEPSIS)
500.0000 mL | Freq: Once | INTRAVENOUS | Status: AC
  Administered 2017-01-06: 500 mL via INTRAVENOUS

## 2017-01-06 NOTE — Progress Notes (Signed)
Progress Note  Patient Name: [name] Date of Encounter: 01/06/2017  Primary Cardiologist: Dr. [name]  Subjective   Not breathing particularly better.  Less swelling.  Still with dyspnea above his baseline.   Inpatient Medications    Scheduled Meds: . carisoprodol  350 mg Oral TID  . carvedilol  12.5 mg Oral BID WC  . doxepin  25 mg Oral BID  . fluticasone furoate-vilanterol  1 puff Inhalation Daily  . furosemide  160 mg Intravenous Q12H  . hydrocortisone  1 application Topical BID  . levofloxacin  750 mg Oral Daily  . levothyroxine  25 mcg Oral QAC breakfast  . lidocaine  1 patch Transdermal Q24H  . mupirocin cream   Topical BID  . pantoprazole  40 mg Oral Daily  . potassium chloride  30 mEq Oral BID  . saccharomyces boulardii  250 mg Oral BID  . sertraline  200 mg Oral QHS  . sodium chloride flush  3 mL Intravenous Q12H  . [START ON 01/07/2017] tamsulosin  0.8 mg Oral QPC breakfast  . Warfarin - Pharmacist Dosing Inpatient   Does not apply q1800  . zolpidem  5 mg Oral QHS,MR X 1   Continuous Infusions:  PRN Meds: acetaminophen **OR** acetaminophen, albuterol, alum & mag hydroxide-simeth, barrier cream, bisacodyl, hydrALAZINE, linaclotide, ondansetron, senna, traZODone   Vital Signs    Vitals:   01/05/17 1721 01/05/17 1727 01/05/17 2145 01/06/17 0552  BP: 106/66  102/69 (!) 121/94  Pulse: 61 89 86 (!) 56  Resp:   18 18  Temp:   97.5 F (36.4 C) 97.9 F (36.6 C)  TempSrc:   Oral Oral  SpO2:   96% 98%  Weight:      Height:        Intake/Output Summary (Last 24 hours) at 01/06/17 1057 Last data filed at 01/06/17 0755  Gross per 24 hour  Intake              372 ml  Output             2600 ml  Net            -2228 ml   Filed Weights   01/03/17 0449 01/04/17 0417 01/05/17 0500  Weight: (!) 315 lb 14.4 oz (143.3 kg) (!) 315 lb 4.8 oz (143 kg) (!) 314 lb (142.4 kg)    Telemetry    Atrial fib rate moderately controlled - Personally Reviewed  ECG    NA - Personally Reviewed  Physical Exam   GEN: No acute distress.   Neck: No  JVD Cardiac: IrregularRR, no murmurs, rubs, or gallops.  Respiratory: Clear  to auscultation bilaterally. GI: Soft, nontender, non-distended  MS: Trace edema; No deformity. Neuro:  Nonfocal  Psych: Normal affect   Labs    Chemistry Recent Labs Lab 01/04/17 0514 01/05/17 0421 01/06/17 0406  NA 136 136 132*  K 4.7 3.8 3.1*  CL 96* 90* 83*  CO2 31 36* 38*  GLUCOSE 84 91 99  BUN 23* 31* 30*  CREATININE 1.32* 1.38* 1.56*  CALCIUM 8.5* 9.1 9.2  GFRNONAA 56* 53* 46*  GFRAA >60 >60 53*  ANIONGAP 9 10 11      Hematology Recent Labs Lab 12/31/16 1136 01/01/17 0539  WBC 6.9 6.9  RBC 4.27 4.16*  HGB 12.5* 12.2*  HCT 39.7 38.4*  MCV 93.0 92.3  MCH 29.3 29.3  MCHC 31.5 31.8  RDW 15.2 15.0  PLT 159 176    Cardiac Enzymes  Recent Labs Lab 12/31/16 1136  TROPONINI <0.03   No results for input(s): TROPIPOC in the last 168 hours.   BNP Recent Labs Lab 12/31/16 1136  BNP 395.2*     DDimer No results for input(s): DDIMER in the last 168 hours.   Radiology    No results found.  Cardiac Studies    ECHO 01/05/27  Study Conclusions  - Left ventricle: The cavity size was severely dilated. There was   mild concentric hypertrophy. Systolic function was severely   reduced. The estimated ejection fraction was in the range of   10-15%. Diffuse hypokinesis. The study was not technically   sufficient to allow evaluation of LV diastolic dysfunction due to   atrial fibrillation. - Ventricular septum: Septal motion showed paradox. - Aortic valve: There was trivial regurgitation. - Mitral valve: There was mild regurgitation. - Left atrium: The atrium was severely dilated. - Right ventricle: The cavity size was moderately dilated. Wall   thickness was normal. Systolic function was severely reduced. - Right atrium: The atrium was severely dilated. - Tricuspid valve: There was moderate  regurgitation. - Pulmonary arteries: Systolic pressure was moderately increased.   PA peak pressure: 45 mm Hg (S). - Impressions: Compared to the prior study on 08/29/2015 LVEF has   decreased from 30-35% to 10-15%.   RVEF is also severely decreased.   Patient Profile     61 y.o. male with a history of S-CHF, COPD, PAF on coumadin, s/p PPM, HTN, HLD, BPH, and GERD   Admitted 03/19 with CAP and CHF, cards asked to see for CHF and not diuresing despite increased Lasix dosing.  Assessment & Plan    ACUTE ON CHRONIC DIASTOLIC HF:  Creat is up slightly over the last few days.  Down 6.8 liters since admission.   Weight seems to be unchanged.  Zaroxolyn was given yesterday but is now discontinued.  I will reduce his scheduled Lasix.    PERMANENT ATRIAL FIB:  INR is subtherapeutic.  Dosing per pharmacy.  Rate is controlled.   HTN:   BP is controlled.  Continue current therapy.   Signed, Minus Breeding, MD  01/06/2017, 10:57 AM

## 2017-01-06 NOTE — Progress Notes (Signed)
ANTICOAGULATION CONSULT NOTE - Follow-Up Consult  Pharmacy Consult for Warfarin Indication: atrial fibrillation  Allergies  Allergen Reactions  . Amiodarone Other (See Comments)    hyperthyroidism  . Pravastatin Sodium Other (See Comments)    myalgia  . Simvastatin Other (See Comments)    severe leg cramps  . Statins Other (See Comments)    myalgia  . Tape Other (See Comments)    Skin Tears Use Paper Tape Only    Patient Measurements: Height: 5\' 10"  (177.8 cm) Weight: 290 lb 4.8 oz (131.7 kg) IBW/kg (Calculated) : 73  Vital Signs: Temp: 97.9 F (36.6 C) (03/25 0552) Temp Source: Oral (03/25 0552) BP: 121/94 (03/25 0552) Pulse Rate: 56 (03/25 0552)  Labs:  Recent Labs  01/04/17 0514 01/05/17 0421 01/06/17 0406  LABPROT 28.7* 23.6* 21.5*  INR 2.64 2.06 1.84  CREATININE 1.32* 1.38* 1.56*    Estimated Creatinine Clearance (by C-G formula based on SCr of 1.56 mg/dL (H)) Male: 55.4 mL/min (A) Male: 67 mL/min (A)  Assessment: Patient is a 61 yo M admitted 3/19 w/ worsening shortness of breath and weakness. Patient is on warfarin PTA for a fib. Pharmacy was consulted to dose while inpatient.   PTA warfarin dose: 2.5mg  Mon and Fri, 5mg  all other days per patient discussion and chart review  INR = 1.84  Goal of Therapy:  INR 2-3 Monitor platelets by anticoagulation protocol: Yes   Plan:  Warfarin 7.5 mg po x 1 today Daily INR Monitor signs/symptoms of bleeding  Thank you Anette Guarneri, PharmD [phone] 01/06/2017 12:27 PM

## 2017-01-06 NOTE — Progress Notes (Signed)
NP on call notified that patient is having nausea, and states, "I feel dizzy.  I feel like I'm going to black out." Blood pressure is lower than patient's usual trend.  When asked if he ever has these occurrences at home, he stated he has had them in the past, and his xanax usually helps.  He stated that he hasn't received any in the hospital. EKG, xanax, and 500 cc bolus ordered. NP on call also ordered 80mg  lasix dose to be held. Since administration of bolus and xanax, the patient has been calm, stating that he feels so much better.  Will continue to monitor patient.

## 2017-01-06 NOTE — Progress Notes (Signed)
PROGRESS NOTE  [name]  NKN:[id number] DOB: [birth year] DOA: 12/31/2016 PCP: [name], MD   Brief Narrative: [name] is a 61 y.o. male with a history of diastolic CHF with last TEE in 2016 demonstrating EF 50-55%, asthma, COPD, chronic respiratory failure on intermittent use of nocturnal oxygen, atrial fibrillation - s/p DCCV in 2016, on coumadin, s/p PPM, HTN, HLD, BPH, and GERD who presented to the emergency department with complaints of progressive worsening of shortness of breath and weakness present for weeks worsening over previous 48 hours, having woken up feeling smothered. He called EMS, but refused transport due to cost, and later had cardiology remotely interrogate PPM. They found an accelerated heart rate 170 bpm and referred him to the ED for further evaluation. Upon arrival he was tachypneic with respirations of 22 with heart rate was 145 and EKG showed atopy with RBBB and left fascicular block, and nonspecific ST- T wave abnormality He was hypoxic, improved on 3 L O2. Blood work was unremarkable, CXR showed posterior right base infiltrate with small right pleural effusion suspicious for pneumonia superimposed on underlying pulmonary vascular congestion. The patient was admitted for acute on chronic respiratory failure secondary to CAP and decompensated CHF resulting in Afib w/ RVR. Rate has been well-controlled. Lasix 80mg  IV BID was started with limited diuresis, so this was augmented. Cardiology has been assisting with diuresis, including metolazone.   Assessment & Plan: Principal Problem:   CAP (community acquired pneumonia) Active Problems:   Essential hypertension   GERD   Anticoagulated on warfarin   COPD (chronic obstructive pulmonary disease) (HCC)   Hyperlipidemia   Atrial fibrillation (HCC)   Acute on chronic respiratory failure (HCC)   Acute on chronic systolic heart failure (HCC)  Acute on chronic hypoxemic respiratory failure: Multifactorial, associated  with CAP, CHF exacerbation superimposed on COPD. Patient uses nocturnal O2 at home intermittently. Currently O2 requirement increased to continuous O2 at 3L - Continue to treat underlying issues and wean off O2 as tolerated - Ambulate w/pulse ox today.   CAP: RLL with associated small effusion. Strep pneumo Ag negative, HIV NR.  - No leukocytosis or fever > transition to po abx to complete 7 days. - Continue incentive spirometry, expectorant prn - Monitor blood cultures - Recommend follow up CXR as outpatient to ensure resolution of infiltrate and effusion.   Acute on chronic CHF: Due to decreasing torsemide dose to 60mg  daily (instead of BID) at home due to frequent urination. EDW appears to be ~302lbs. 310lbs at admission. BNP 395. Tn neg.  - Holding torsemide and started lasix 80 mg IV BID > up to 120mg  IV with poor UOP > 160mg  IV BID + metolazone daily. De-escalating 3/25.  - Monitor renal function closely.  - Appreciate cardiology input.  - Stop CCB w/low EF. Consider increase coreg vs. add digoxin if rate becomes uncontrolled. Intermittently bradycardic.  - Would ideally add low dose ACE inhibitor once renal function is stable.  - Daily I/O and daily weights: Widely disparate results (weight stable above EDW until 3/25 down to 290lbs?) - Update echocardiogram showed interval worsening. Compared to the prior study on 08/29/2015 LVEF has decreased from 30-35% to 10-15%. RVEF is also severely decreased. - Will need repeat echo 3 months after medication optimization and AICD placement if EF remains <35%.  Afib: Currently rate controlled. - Continue coumadin per pharmacy. INR 1.96 on admission. - Continue telemetry.  - Keep K > 4, repleting  - Changed nebivolol to coreg  per cardiology and stopped diltiazem due to low EF.   GERD: Chronic, stable - Continue PPI  Hypertension: Chronic, stable - Continue diuresis, coreg. Stopped dilt as above.  - Hydralazine prn ordered.    Dermatitis: Pruritic, nonspecific. Has been biopsied by dermatology as outpatient. No disease-specific therapy was advised. No evidence of bacterial infection. Superficial cultures were ordered at admission, showing MSSA, not thought to be true infection.  - Topical treatments, most important intervention is aimed at preventing scratching (dressings working well).  Hypothyroidism: most recent TSH 1.48 on 11/29/2016 - Continue home synthroid  Muscle spasms: Reordered home medications. Stable.  BPH:  - Restarted flomax, still with weak stream per pt. Not retaining per bladder scan 3/22. Will increase to 0.8mg . Monitor for orthostatic hypotension.   DVT prophylaxis: Coumadin Code Status: Full Family Communication: None at bedside this AM. Disposition Plan: [place] home with girlfriend once improving. PT recommends no follow up or DME.  Consultants:   Cardiology  Procedures:   ECHOCARDIOGRAM 01/04/2017: - Left ventricle: The cavity size was severely dilated. There was   mild concentric hypertrophy. Systolic function was severely   reduced. The estimated ejection fraction was in the range of   10-15%. Diffuse hypokinesis. The study was not technically   sufficient to allow evaluation of LV diastolic dysfunction due to   atrial fibrillation. - Ventricular septum: Septal motion showed paradox. - Aortic valve: There was trivial regurgitation. - Mitral valve: There was mild regurgitation. - Left atrium: The atrium was severely dilated. - Right ventricle: The cavity size was moderately dilated. Wall   thickness was normal. Systolic function was severely reduced. - Right atrium: The atrium was severely dilated. - Tricuspid valve: There was moderate regurgitation. - Pulmonary arteries: Systolic pressure was moderately increased.   PA peak pressure: 45 mm Hg (S). - Impressions: Compared to the prior study on 08/29/2015 LVEF has   decreased from 30-35% to 10-15%.   RVEF is also  severely decreased.  Impressions: - Compared to the prior study on 08/29/2015 LVEF has decreased from   30-35% to 10-15%.   RVEF is also severely decreased.  Antimicrobials:  Azithromycin 3/19 >> 3/23  Ceftriaxone 3/19 >> 3/23  Levaquin 3/23 > 3/26  Subjective: Pt still more dyspneic than baseline. Ambulating and eating regularly. Denies chest pain or palpitations.  Objective: Vitals:   01/05/17 2145 01/06/17 0552 01/06/17 1132 01/06/17 1446  BP: 102/69 (!) 121/94  111/62  Pulse: 86 (!) 56  88  Resp: 18 18  16   Temp: 97.5 F (36.4 C) 97.9 F (36.6 C)  97.6 F (36.4 C)  TempSrc: Oral Oral  Oral  SpO2: 96% 98%    Weight:   131.7 kg (290 lb 4.8 oz)   Height:        Intake/Output Summary (Last 24 hours) at 01/06/17 1512 Last data filed at 01/06/17 0755  Gross per 24 hour  Intake              132 ml  Output             1800 ml  Net            -1668 ml   Filed Weights   01/04/17 0417 01/05/17 0500 01/06/17 1132  Weight: (!) 143 kg (315 lb 4.8 oz) (!) 142.4 kg (314 lb) 131.7 kg (290 lb 4.8 oz)    Examination: General exam: Morbidly obese, pleasant male in no distress Respiratory system: Non-labored breathing on 3L by [place]. Crackles resolved.  Clear, diminished throughout. Cardiovascular system: Irreg irreg. No murmur, rub, or gallop. No JVD, and trace pitting LE edema improved today. Gastrointestinal system: Abdomen soft, non-tender, non-distended, with normoactive bowel sounds. No organomegaly or masses felt. Central nervous system: Alert and oriented. No focal neurological deficits. Extremities: Warm, no deformities Skin: Scattered nonconfluent erythematous-violaceous macular eruption, some excoriated and draining serous fluid x3 on mid back, improving with c/d/i dressings. No purulence, induration, or fluctuance.  Psychiatry: Judgement and insight appear normal. Mood & affect appropriate.   Data Reviewed: I have personally reviewed following labs and imaging  studies  CBC:  Recent Labs Lab 12/31/16 1136 01/01/17 0539  WBC 6.9 6.9  HGB 12.5* 12.2*  HCT 39.7 38.4*  MCV 93.0 92.3  PLT 159 449   Basic Metabolic Panel:  Recent Labs Lab 01/02/17 1001 01/03/17 0412 01/04/17 0514 01/05/17 0421 01/06/17 0406  NA 138 134* 136 136 132*  K 3.8 4.5 4.7 3.8 3.1*  CL 100* 97* 96* 90* 83*  CO2 28 26 31  36* 38*  GLUCOSE 153* 94 84 91 99  BUN 17 23* 23* 31* 30*  CREATININE 1.11 1.19 1.32* 1.38* 1.56*  CALCIUM 8.4* 8.7* 8.5* 9.1 9.2   GFR: Estimated Creatinine Clearance (by C-G formula based on SCr of 1.56 mg/dL (H)) Male: 55.4 mL/min (A) Male: 67 mL/min (A) Liver Function Tests: No results for input(s): AST, ALT, ALKPHOS, BILITOT, PROT, ALBUMIN in the last 168 hours. No results for input(s): LIPASE, AMYLASE in the last 168 hours. No results for input(s): AMMONIA in the last 168 hours. Coagulation Profile:  Recent Labs Lab 01/02/17 1001 01/03/17 0412 01/04/17 0514 01/05/17 0421 01/06/17 0406  INR 2.49 2.62 2.64 2.06 1.84   Cardiac Enzymes:  Recent Labs Lab 12/31/16 1136  TROPONINI <0.03   BNP (last 3 results) No results for input(s): PROBNP in the last 8760 hours. HbA1C: No results for input(s): HGBA1C in the last 72 hours. CBG: No results for input(s): GLUCAP in the last 168 hours. Lipid Profile: No results for input(s): CHOL, HDL, LDLCALC, TRIG, CHOLHDL, LDLDIRECT in the last 72 hours. Thyroid Function Tests: No results for input(s): TSH, T4TOTAL, FREET4, T3FREE, THYROIDAB in the last 72 hours. Anemia Panel: No results for input(s): VITAMINB12, FOLATE, FERRITIN, TIBC, IRON, RETICCTPCT in the last 72 hours. Urine analysis:    Component Value Date/Time   COLORURINE YELLOW 11/29/2016 1518   APPEARANCEUR CLEAR 11/29/2016 1518   LABSPEC 1.020 11/29/2016 1518   PHURINE 6.0 11/29/2016 1518   GLUCOSEU NEGATIVE 11/29/2016 1518   HGBUR SMALL (A) 11/29/2016 1518   BILIRUBINUR NEGATIVE 11/29/2016 1518   KETONESUR  NEGATIVE 11/29/2016 1518   PROTEINUR NEGATIVE 06/07/2014 1031   UROBILINOGEN 0.2 11/29/2016 1518   NITRITE NEGATIVE 11/29/2016 1518   LEUKOCYTESUR NEGATIVE 11/29/2016 1518   Recent Results (from the past 240 hour(s))  Culture, blood (routine x 2) Call MD if unable to obtain prior to antibiotics being given     Status: None   Collection Time: 12/31/16  6:58 PM  Result Value Ref Range Status   Specimen Description BLOOD RIGHT ANTECUBITAL  Final   Special Requests BOTTLES DRAWN AEROBIC ONLY 5CC  Final   Culture NO GROWTH 5 DAYS  Final   Report Status 01/05/2017 FINAL  Final  Culture, blood (routine x 2) Call MD if unable to obtain prior to antibiotics being given     Status: None   Collection Time: 12/31/16  7:07 PM  Result Value Ref Range Status   Specimen Description BLOOD  LEFT ANTECUBITAL  Final   Special Requests BOTTLES DRAWN AEROBIC AND ANAEROBIC 5CC  Final   Culture NO GROWTH 5 DAYS  Final   Report Status 01/05/2017 FINAL  Final  Aerobic Culture (superficial specimen)     Status: None   Collection Time: 12/31/16 11:32 PM  Result Value Ref Range Status   Specimen Description BACK  Final   Special Requests Normal  Final   Gram Stain   Final    NO WBC SEEN RARE SQUAMOUS EPITHELIAL CELLS PRESENT NO ORGANISMS SEEN    Culture FEW STAPHYLOCOCCUS AUREUS  Final   Report Status 01/03/2017 FINAL  Final   Organism ID, Bacteria STAPHYLOCOCCUS AUREUS  Final      Susceptibility   Staphylococcus aureus - MIC*    CIPROFLOXACIN <=0.5 SENSITIVE Sensitive     ERYTHROMYCIN RESISTANT Resistant     GENTAMICIN <=0.5 SENSITIVE Sensitive     OXACILLIN 0.5 SENSITIVE Sensitive     TETRACYCLINE <=1 SENSITIVE Sensitive     VANCOMYCIN <=0.5 SENSITIVE Sensitive     TRIMETH/SULFA <=10 SENSITIVE Sensitive     CLINDAMYCIN RESISTANT Resistant     RIFAMPIN <=0.5 SENSITIVE Sensitive     Inducible Clindamycin POSITIVE Resistant     * FEW STAPHYLOCOCCUS AUREUS      Radiology Studies: No results  found.  Scheduled Meds: . carisoprodol  350 mg Oral TID  . carvedilol  12.5 mg Oral BID WC  . doxepin  25 mg Oral BID  . fluticasone furoate-vilanterol  1 puff Inhalation Daily  . furosemide  80 mg Intravenous Q12H  . hydrocortisone  1 application Topical BID  . levothyroxine  25 mcg Oral QAC breakfast  . lidocaine  1 patch Transdermal Q24H  . mupirocin cream   Topical BID  . pantoprazole  40 mg Oral Daily  . potassium chloride  30 mEq Oral BID  . saccharomyces boulardii  250 mg Oral BID  . sertraline  200 mg Oral QHS  . sodium chloride flush  3 mL Intravenous Q12H  . [START ON 01/07/2017] tamsulosin  0.8 mg Oral QPC breakfast  . warfarin  7.5 mg Oral ONCE-1800  . Warfarin - Pharmacist Dosing Inpatient   Does not apply q1800  . zolpidem  5 mg Oral QHS,MR X 1   Continuous Infusions:   LOS: 6 days   Time spent: 25 minutes.  Vance Gather, MD [facility] Pager [phone]  If 7PM-7AM, please contact night-coverage [email or web address] Password [email or web address] 01/06/2017, 3:12 PM

## 2017-01-07 LAB — CBC
HCT: 41.1 % (ref 39.0–52.0)
Hemoglobin: 13.9 g/dL (ref 13.0–17.0)
MCH: 29.3 pg (ref 26.0–34.0)
MCHC: 33.8 g/dL (ref 30.0–36.0)
MCV: 86.7 fL (ref 78.0–100.0)
PLATELETS: 234 10*3/uL (ref 150–400)
RBC: 4.74 MIL/uL (ref 4.22–5.81)
RDW: 13.8 % (ref 11.5–15.5)
WBC: 8.8 10*3/uL (ref 4.0–10.5)

## 2017-01-07 LAB — PROTIME-INR
INR: 1.52
PROTHROMBIN TIME: 18.5 s — AB (ref 11.4–15.2)

## 2017-01-07 LAB — BASIC METABOLIC PANEL
Anion gap: 11 (ref 5–15)
BUN: 28 mg/dL — ABNORMAL HIGH (ref 6–20)
CO2: 37 mmol/L — ABNORMAL HIGH (ref 22–32)
Calcium: 9.2 mg/dL (ref 8.9–10.3)
Chloride: 82 mmol/L — ABNORMAL LOW (ref 101–111)
Creatinine, Ser: 1.47 mg/dL — ABNORMAL HIGH (ref 0.61–1.24)
GFR calc Af Amer: 57 mL/min — ABNORMAL LOW (ref >60)
GFR calc non Af Amer: 49 mL/min — ABNORMAL LOW (ref >60)
Glucose, Bld: 98 mg/dL (ref 65–99)
Potassium: 3.3 mmol/L — ABNORMAL LOW (ref 3.5–5.1)
Sodium: 130 mmol/L — ABNORMAL LOW (ref 135–145)

## 2017-01-07 MED ORDER — POTASSIUM CHLORIDE CRYS ER 20 MEQ PO TBCR
30.0000 meq | EXTENDED_RELEASE_TABLET | Freq: Two times a day (BID) | ORAL | Status: AC
  Administered 2017-01-07 (×2): 30 meq via ORAL
  Filled 2017-01-07 (×2): qty 1

## 2017-01-07 MED ORDER — TORSEMIDE 20 MG PO TABS
60.0000 mg | ORAL_TABLET | Freq: Two times a day (BID) | ORAL | Status: DC
  Administered 2017-01-07: 60 mg via ORAL
  Filled 2017-01-07: qty 3

## 2017-01-07 MED ORDER — WARFARIN SODIUM 7.5 MG PO TABS
7.5000 mg | ORAL_TABLET | Freq: Once | ORAL | Status: AC
  Administered 2017-01-07: 7.5 mg via ORAL
  Filled 2017-01-07: qty 1

## 2017-01-07 MED ORDER — ALPRAZOLAM 0.5 MG PO TABS
0.5000 mg | ORAL_TABLET | Freq: Every evening | ORAL | Status: DC | PRN
  Administered 2017-01-07: 1 mg via ORAL
  Filled 2017-01-07: qty 2

## 2017-01-07 NOTE — Progress Notes (Signed)
Progress Note  Patient Name: [name] Date of Encounter: 01/07/2017  Primary Cardiologist: Dr. [name]   Subjective   He felt like he had a panic attack last night.  Feels a bit better this morning. Breathing is improved. He notes marked improvement in his LEE. "I can now see my ankles".   Inpatient Medications    Scheduled Meds: . carisoprodol  350 mg Oral TID  . carvedilol  12.5 mg Oral BID WC  . doxepin  25 mg Oral BID  . fluticasone furoate-vilanterol  1 puff Inhalation Daily  . furosemide  80 mg Intravenous Q12H  . hydrocortisone  1 application Topical BID  . levothyroxine  25 mcg Oral QAC breakfast  . lidocaine  1 patch Transdermal Q24H  . mupirocin cream   Topical BID  . pantoprazole  40 mg Oral Daily  . saccharomyces boulardii  250 mg Oral BID  . sertraline  200 mg Oral QHS  . sodium chloride flush  3 mL Intravenous Q12H  . tamsulosin  0.8 mg Oral QPC breakfast  . warfarin  7.5 mg Oral ONCE-1800  . Warfarin - Pharmacist Dosing Inpatient   Does not apply q1800  . zolpidem  5 mg Oral QHS,MR X 1   Continuous Infusions:  PRN Meds: acetaminophen **OR** acetaminophen, albuterol, alum & mag hydroxide-simeth, barrier cream, bisacodyl, hydrALAZINE, linaclotide, ondansetron, ondansetron, senna, traZODone   Vital Signs    Vitals:   01/07/17 0528 01/07/17 0557 01/07/17 0720 01/07/17 0904  BP:    120/72  Pulse: (!) 53 88    Resp:      Temp:      TempSrc:      SpO2: 92%  (!) 89%   Weight:  292 lb 15.9 oz (132.9 kg)    Height:        Intake/Output Summary (Last 24 hours) at 01/07/17 1231 Last data filed at 01/07/17 0909  Gross per 24 hour  Intake              720 ml  Output             1250 ml  Net             -530 ml   Filed Weights   01/05/17 0500 01/06/17 1132 01/07/17 0557  Weight: (!) 314 lb (142.4 kg) 290 lb 4.8 oz (131.7 kg) 292 lb 15.9 oz (132.9 kg)    Telemetry    Atrial fibrillation w/ CVR in the 80s - Personally Reviewed  ECG    Chronic  atrial fibrillation - Personally Reviewed  Physical Exam   GEN: No acute distress.   Neck: No JVD Cardiac:irregularly irregular, no murmurs, rubs, or gallops.  Respiratory: Clear to auscultation bilaterally. GI: Soft, nontender, non-distended  MS: No edema; No deformity. Neuro:  Nonfocal  Psych: Normal affect   Labs    Chemistry Recent Labs Lab 01/05/17 0421 01/06/17 0406 01/07/17 0637  NA 136 132* 130*  K 3.8 3.1* 3.3*  CL 90* 83* 82*  CO2 36* 38* 37*  GLUCOSE 91 99 98  BUN 31* 30* 28*  CREATININE 1.38* 1.56* 1.47*  CALCIUM 9.1 9.2 9.2  GFRNONAA 53* 46* 49*  GFRAA >60 53* 57*  ANIONGAP 10 11 11      Hematology Recent Labs Lab 01/01/17 0539 01/07/17 0637  WBC 6.9 8.8  RBC 4.16* 4.74  HGB 12.2* 13.9  HCT 38.4* 41.1  MCV 92.3 86.7  MCH 29.3 29.3  MCHC 31.8 33.8  RDW 15.0  13.8  PLT 176 234    Cardiac EnzymesNo results for input(s): TROPONINI in the last 168 hours. No results for input(s): TROPIPOC in the last 168 hours.   BNPNo results for input(s): BNP, PROBNP in the last 168 hours.   DDimer No results for input(s): DDIMER in the last 168 hours.   Radiology    No results found.  Cardiac Studies   2D Echo 01/04/17 Study Conclusions  - Left ventricle: The cavity size was severely dilated. There was   mild concentric hypertrophy. Systolic function was severely   reduced. The estimated ejection fraction was in the range of   10-15%. Diffuse hypokinesis. The study was not technically   sufficient to allow evaluation of LV diastolic dysfunction due to   atrial fibrillation. - Ventricular septum: Septal motion showed paradox. - Aortic valve: There was trivial regurgitation. - Mitral valve: There was mild regurgitation. - Left atrium: The atrium was severely dilated. - Right ventricle: The cavity size was moderately dilated. Wall   thickness was normal. Systolic function was severely reduced. - Right atrium: The atrium was severely dilated. -  Tricuspid valve: There was moderate regurgitation. - Pulmonary arteries: Systolic pressure was moderately increased.   PA peak pressure: 45 mm Hg (S). - Impressions: Compared to the prior study on 08/29/2015 LVEF has   decreased from 30-35% to 10-15%.   RVEF is also severely decreased.  Impressions:  - Compared to the prior study on 08/29/2015 LVEF has decreased from   30-35% to 10-15%.   RVEF is also severely decreased.  Patient Profile     61 y.o. male with a history of S-CHF, COPD, PAF on coumadin, HTN, HLD, BPH, and GERD   Admitted 03/19 with CAP and CHF, cards asked to see for CHF and not diuresing despite increased Lasix dosing.  Note: Cardiac catheterization June 2017 showed a small D1 ostial 80% lesion but otherwise no significant coronary disease. Ejection fraction was 30% at that time.   Assessment & Plan    1. 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 RVEF also severely reduced.   -- He is diuresing. -2.4 L out yesterday. I/Os net negative 8.8L since admit.  -- Weight improved from 315>>292 lb.  -- SCr improved since yesterday from 1.56>>1.47. -- Continue Lasix, 80 mg IV BID. Plan transition back to PO torsemide in the next 24 hrs, possibly -- Monitor Renal function and continue strict I/Os/ daily weights.  -- Low sodium diet. -- Monitor BMP and replete K. -- Continue BB therapy with Coreg. -- Consider addition of Entresto if renal function improves -- It appears he was on Cardizem 120 mg as an outpatient. This will need to be permanently discontinued at time of d/c given severe LV dysfunction.  --  ? If he would benefit from CRT-D given Biventricular failure -- He may also benefit from AHF consult given EF of 10-15%   2. Permanent Atrial Fibrillation: -- continue rate control with BB only. Avoid further use of Cardizem given severe LV dysfunction.  -- Amiodarone was discontinued by Dr. [name] given thyroid  toxicity -- can consider addition of digoxin if additional rate control is needed.  -- continue Coumadin for a/c. His INR is subtherapeutic at 1.52. Goal is 2.0-3.0. Continue dosing per pharmacy.  -- replete K. 3.3 today.   3. HTN:   -- controlled on current regimen. Monitor.   4. CAP: management per primary team   Signed, Lyda Jester, PA-C  01/07/2017, 12:31 PM    I have seen and examined the patient along with Lyda Jester, PA.  I have reviewed the chart, notes and new data.  I agree with PA's note.  Key new complaints: "panic attack" may have been hypotension related to very rapid diuresis, no significant arrhythmia. Now under his previous self-estimated "dry weight" of 300 lb. Key examination changes: prominent varicose veins, minimal residual leg edema, clear lungs, irregular rhythm. Rate 80-90 s most of the day, now creeping up to 100. Key new findings / data: hypokalemia, creat approx 1.5 (baseline 1.0-1.1). ECG AF with nonspecific IVCD (closer to RBBB morphology, but extremely broad QRS 186-192 ms).  PLAN: Switch to PO diuretics, watch at least 24h. Increase carvedilol. Replete K. Possible DC later in the day tomorrow. Will need reassessment of LVEF after 2-3 months of good rate control. If this cannot be achieved, consider AV node ablation and CRT. He has discussed CRT with Dr. [name] in the past - I think it was felt he might not respond due to absence of typical LBBB morphology.  Sanda Klein, MD, [place] [phone] 01/07/2017, 2:44 PM

## 2017-01-07 NOTE — Care Management Important Message (Signed)
Important Message  Patient Details  Name: [name] MRN: [id number] Date of Birth: January 04, 1956   Medicare Important Message Given:  Yes    Aishah Teffeteller Abena 01/07/2017, 4:10 PM

## 2017-01-07 NOTE — Progress Notes (Signed)
qPhysical Therapy Treatment Patient Details Name: [name] MRN: [id number] DOB: 07-Jan-1956 Today's Date: 01/07/2017    History of Present Illness [name] is a 61 y.o. male with a history of diastolic CHF with last TEE in 2016 demonstrating EF 50-55%, NICM, asthma, COPD, chronic respiratory failure on intermittent use of nocturnal oxygen, atrial fibrillation - s/p DCCV in 2016, on coumadin, s/p PPM, HTN, HLD, BPH, and GERD who presented to the emergency department with complaints of progressive worsening of shortness of breath and weakness.  Patient with CAP, CHF, and Afib with RVR.    PT Comments    Pt performed gait and stair training in prep for d/c home.  Pt reports he is feeling close to baseline.  Will continue to follow patient during hospitalization.     Follow Up Recommendations  No PT follow up;Supervision - Intermittent     Equipment Recommendations  None recommended by PT    Recommendations for Other Services       Precautions / Restrictions Precautions Precautions: None Restrictions Weight Bearing Restrictions: No    Mobility  Bed Mobility Overal bed mobility: Independent             General bed mobility comments: Patient in recliner.  Reports he sleeps in recliner at home.  Transfers Overall transfer level: Modified independent Equipment used: None             General transfer comment: Patient uses correct hand placement.  Increased time.  No physical assist needed.  Ambulation/Gait Ambulation/Gait assistance: Supervision Ambulation Distance (Feet): 100 Feet (x2) Assistive device: None Gait Pattern/deviations: Step-through pattern;Decreased stride length Gait velocity: decreased   General Gait Details: Patient with slow gait, slightly unsteady at times with no assistive device.  No loss of balance.  Minimal dyspnea with short distance gait.   Stairs Stairs: Yes   Stair Management: One rail Right Number of Stairs: 5 General  stair comments: Cues for sequencing, performed step to pattern with min guard for safety.    Wheelchair Mobility    Modified Rankin (Stroke Patients Only)       Balance Overall balance assessment: Needs assistance   Sitting balance-Leahy Scale: Good       Standing balance-Leahy Scale: Good                              Cognition Arousal/Alertness: Awake/alert Behavior During Therapy: WFL for tasks assessed/performed Overall Cognitive Status: Within Functional Limits for tasks assessed                                        Exercises      General Comments        Pertinent Vitals/Pain Pain Assessment: No/denies pain    Home Living                      Prior Function            PT Goals (current goals can now be found in the care plan section) Acute Rehab PT Goals Patient Stated Goal: To get home Potential to Achieve Goals: Good Progress towards PT goals: Progressing toward goals    Frequency    Min 3X/week      PT Plan Current plan remains appropriate    Co-evaluation  End of Session Equipment Utilized During Treatment: Gait belt Activity Tolerance: Patient tolerated treatment well;Patient limited by fatigue Patient left: in chair;with call bell/phone within reach Nurse Communication: Mobility status PT Visit Diagnosis: Unsteadiness on feet (R26.81);Other abnormalities of gait and mobility (R26.89);Muscle weakness (generalized) (M62.81)     Time: 2831-5176 PT Time Calculation (min) (ACUTE ONLY): 9 min  Charges:  $Gait Training: 8-22 mins                    G Codes:       Governor Rooks, PTA pager [phone]    Cristela Blue 01/07/2017, 6:14 PM

## 2017-01-07 NOTE — Progress Notes (Signed)
ANTICOAGULATION CONSULT NOTE - Follow-Up Consult  Pharmacy Consult for Warfarin Indication: atrial fibrillation  Allergies  Allergen Reactions  . Amiodarone Other (See Comments)    hyperthyroidism  . Pravastatin Sodium Other (See Comments)    myalgia  . Simvastatin Other (See Comments)    severe leg cramps  . Statins Other (See Comments)    myalgia  . Tape Other (See Comments)    Skin Tears Use Paper Tape Only    Patient Measurements: Height: 5\' 10"  (177.8 cm) Weight: 292 lb 15.9 oz (132.9 kg) IBW/kg (Calculated) : 73  Vital Signs: Temp: 98 F (36.7 C) (03/26 0527) Temp Source: Oral (03/26 0527) BP: 120/72 (03/26 0904) Pulse Rate: 88 (03/26 0557)  Labs:  Recent Labs  01/05/17 0421 01/06/17 0406 01/07/17 0637  HGB  --   --  13.9  HCT  --   --  41.1  PLT  --   --  234  LABPROT 23.6* 21.5* 18.5*  INR 2.06 1.84 1.52  CREATININE 1.38* 1.56* 1.47*    Estimated Creatinine Clearance (by C-G formula based on SCr of 1.47 mg/dL (H)) Male: 59.1 mL/min (A) Male: 71.5 mL/min (A)  Assessment: Patient is a 61 yo M admitted 3/19 w/ worsening shortness of breath and weakness. Patient is on warfarin PTA for a fib. Pharmacy was consulted to dose while inpatient.   PTA warfarin dose: 2.5mg  Mon and Fri, 5mg  all other days per patient discussion and chart review.  Patient did not receive dose on 3/25, so INR remains subtherapeutic at 1.52. Pt is currently taking Levaquin daily started on 3/22, had azithromycin 3/19-3/21. Pt having nausea overnight but feels better after xanax and fluid bolus.  Goal of Therapy:  INR 2-3 Monitor platelets by anticoagulation protocol: Yes   Plan:  Warfarin 7.5 mg po x 1 today Monitor daily INR, CBC, clinical course, s/sx of bleed, PO intake, DDI   Thank you for allowing [place] to participate in this patients care.  Jens Som, PharmD Clinical phone for 01/07/2017 from 7a-3:30p: [phone] If after 3:30p, please call main pharmacy at:  [phone] 01/07/2017 9:28 AM

## 2017-01-07 NOTE — Progress Notes (Signed)
PROGRESS NOTE  [name]  GGY:[id number] DOB: 08-Nov-1955 DOA: 12/31/2016 PCP: [name], MD   Brief Narrative: [name] is a 61 y.o. male with a history of diastolic CHF with last TEE in 2016 demonstrating EF 50-55%, asthma, COPD, chronic respiratory failure on intermittent use of nocturnal oxygen, atrial fibrillation s/p DCCV in 2016, on coumadin, s/p PPM, HTN, HLD, BPH, and GERD who presented to the emergency department with complaints of progressive worsening of shortness of breath and weakness present for weeks worsening over previous 48 hours, having woken up feeling smothered. He called EMS, but refused transport due to cost, and later had cardiology remotely interrogate PPM. They found an accelerated heart rate 170 bpm and referred him to the ED for further evaluation. Upon arrival he was tachypneic with respirations of 22, heart rate was 145 and ECG showed atopy with RBBB and left fascicular block, and nonspecific ST- T wave abnormality. He was hypoxic, improved on 3 L O2. Blood work was unremarkable, CXR showed posterior right base infiltrate with small right pleural effusion suspicious for pneumonia superimposed on underlying pulmonary vascular congestion. The patient was admitted for acute on chronic respiratory failure secondary to CAP and decompensated CHF resulting in Afib w/ RVR. Rate has been well-controlled. Lasix 80mg  IV BID was started with limited diuresis, so this was augmented. Updated echocardiogram showed interval worsening of ejection fraction. Cardiology has been assisting with diuresis, including metolazone.   Assessment & Plan: Principal Problem:   CAP (community acquired pneumonia) Active Problems:   Essential hypertension   GERD   Anticoagulated on warfarin   COPD (chronic obstructive pulmonary disease) (HCC)   Hyperlipidemia   Atrial fibrillation (HCC)   Acute on chronic respiratory failure (HCC)   Acute on chronic systolic heart failure (HCC)  Acute on  chronic hypoxemic respiratory failure: Multifactorial, associated with CAP, CHF exacerbation superimposed on COPD. Patient uses nocturnal O2 at home intermittently. Currently O2 requirement Improving.  - Continue to treat underlying issues and wean off O2 as tolerated - Ambulate w/pulse ox tomorrow.   CAP: RLL with associated small effusion. Strep pneumo Ag negative, HIV NR. No leukocytosis or fever. Finished abx 3/25. Blood cultures negative.  - Continue incentive spirometry, expectorant prn - Recommend follow up CXR as outpatient to ensure resolution of infiltrate and effusion.   Acute on chronic CHF: Due to decreasing torsemide dose to 60mg  daily (instead of BID) at home due to frequent urination. EDW appears to be ~302lbs. 310lbs at admission. BNP 395. Tn neg.  - Holding torsemide and started lasix 80 mg IV BID > up to 120mg  IV with poor UOP > 160mg  IV BID + metolazone daily >> Back to po torsemide today per cardiology. - Monitor renal function closely.  - Appreciate cardiology input.  - Stop CCB w/low EF. Increasing coreg. Could consider adding digoxin if rate becomes uncontrolled. Intermittently bradycardic.  - Would ideally add low dose ACE inhibitor once renal function is stable.  - Daily I/O and daily weights: Widely disparate results (weight stable above EDW until 3/25 down to 290lbs?) - Update echocardiogram showed interval worsening. Compared to the prior study on 08/29/2015 LVEF has decreased from 30-35% to 10-15%. RVEF is also severely decreased. - Will need repeat echo 3 months after medication optimization and AICD placement if EF remains <35%.  Afib: Currently rate controlled. - Continue coumadin per pharmacy. INR 1.96 on admission. - Continue telemetry.  - Keep K > 4, repleting  - Changed nebivolol to coreg per  cardiology and stopped diltiazem due to low EF.   GERD: Chronic, stable - Continue PPI  Hypertension: Chronic, stable - Continue diuresis, coreg. Stopped  dilt as above.  - Hydralazine prn ordered.   Dermatitis: Pruritic, nonspecific. Has been biopsied by dermatology as outpatient. No disease-specific therapy was advised. No evidence of bacterial infection. Superficial cultures were ordered at admission, showing MSSA, not thought to be true infection.  - Topical treatments, most important intervention is aimed at preventing scratching (dressings working well).  Hypothyroidism: most recent TSH 1.48 on 11/29/2016 - Continue home synthroid  Muscle spasms: Reordered home medications. Stable.  BPH:  - Restarted flomax, still with weak stream per pt. Not retaining per bladder scan 3/22. Will increase to 0.8mg . Monitor for orthostatic hypotension.   DVT prophylaxis: Coumadin Code Status: Full Family Communication: None at bedside this AM. Disposition Plan: [place] home with girlfriend once improving, possible 3/27. PT recommends no follow up or DME.  Consultants:   Cardiology  Procedures:   ECHOCARDIOGRAM 01/04/2017: - Left ventricle: The cavity size was severely dilated. There was   mild concentric hypertrophy. Systolic function was severely   reduced. The estimated ejection fraction was in the range of   10-15%. Diffuse hypokinesis. The study was not technically   sufficient to allow evaluation of LV diastolic dysfunction due to   atrial fibrillation. - Ventricular septum: Septal motion showed paradox. - Aortic valve: There was trivial regurgitation. - Mitral valve: There was mild regurgitation. - Left atrium: The atrium was severely dilated. - Right ventricle: The cavity size was moderately dilated. Wall   thickness was normal. Systolic function was severely reduced. - Right atrium: The atrium was severely dilated. - Tricuspid valve: There was moderate regurgitation. - Pulmonary arteries: Systolic pressure was moderately increased.   PA peak pressure: 45 mm Hg (S). - Impressions: Compared to the prior study on 08/29/2015  LVEF has   decreased from 30-35% to 10-15%.   RVEF is also severely decreased.  Impressions: - Compared to the prior study on 08/29/2015 LVEF has decreased from   30-35% to 10-15%.   RVEF is also severely decreased.  Antimicrobials:  Azithromycin 3/19 >> 3/23  Ceftriaxone 3/19 >> 3/23  Levaquin 3/23 > 3/26  Subjective: Dyspnea improving. Had panic attack resolved with xanax last night, still not sleeping well generally. Leg swelling much better. Ambulating and eating regularly. Denies chest pain or palpitations.  Objective: Vitals:   01/07/17 0528 01/07/17 0557 01/07/17 0720 01/07/17 0904  BP:    120/72  Pulse: (!) 53 88    Resp:      Temp:      TempSrc:      SpO2: 92%  (!) 89%   Weight:  132.9 kg (292 lb 15.9 oz)    Height:        Intake/Output Summary (Last 24 hours) at 01/07/17 1525 Last data filed at 01/07/17 7989  Gross per 24 hour  Intake              720 ml  Output             1250 ml  Net             -530 ml   Filed Weights   01/05/17 0500 01/06/17 1132 01/07/17 0557  Weight: (!) 142.4 kg (314 lb) 131.7 kg (290 lb 4.8 oz) 132.9 kg (292 lb 15.9 oz)    Examination: General exam: Morbidly obese, pleasant male in no distress Respiratory system: Non-labored breathing on  3L by [place]. Clear, diminished Cardiovascular system: Irreg irreg. No murmur, rub, or gallop. No JVD. Trace symmetric LE edema. Gastrointestinal system: Abdomen soft, non-tender, non-distended, with normoactive bowel sounds. No organomegaly or masses felt. Central nervous system: Alert and oriented. No focal neurological deficits. Extremities: Warm, no deformities Skin: Scattered nonconfluent erythematous-violaceous macular eruption, some excoriated and draining serous fluid x3 on mid back, improving with c/d/i dressings. No purulence, induration, or fluctuance.  Psychiatry: Judgement and insight appear normal. Mood & affect appropriate.   Data Reviewed: I have personally reviewed following  labs and imaging studies  CBC:  Recent Labs Lab 01/01/17 0539 01/07/17 0637  WBC 6.9 8.8  HGB 12.2* 13.9  HCT 38.4* 41.1  MCV 92.3 86.7  PLT 176 161   Basic Metabolic Panel:  Recent Labs Lab 01/03/17 0412 01/04/17 0514 01/05/17 0421 01/06/17 0406 01/07/17 0637  NA 134* 136 136 132* 130*  K 4.5 4.7 3.8 3.1* 3.3*  CL 97* 96* 90* 83* 82*  CO2 26 31 36* 38* 37*  GLUCOSE 94 84 91 99 98  BUN 23* 23* 31* 30* 28*  CREATININE 1.19 1.32* 1.38* 1.56* 1.47*  CALCIUM 8.7* 8.5* 9.1 9.2 9.2   GFR: Estimated Creatinine Clearance (by C-G formula based on SCr of 1.47 mg/dL (H)) Male: 59.1 mL/min (A) Male: 71.5 mL/min (A) Liver Function Tests: No results for input(s): AST, ALT, ALKPHOS, BILITOT, PROT, ALBUMIN in the last 168 hours. No results for input(s): LIPASE, AMYLASE in the last 168 hours. No results for input(s): AMMONIA in the last 168 hours. Coagulation Profile:  Recent Labs Lab 01/03/17 0412 01/04/17 0514 01/05/17 0421 01/06/17 0406 01/07/17 0637  INR 2.62 2.64 2.06 1.84 1.52   Cardiac Enzymes: No results for input(s): CKTOTAL, CKMB, CKMBINDEX, TROPONINI in the last 168 hours. BNP (last 3 results) No results for input(s): PROBNP in the last 8760 hours. HbA1C: No results for input(s): HGBA1C in the last 72 hours. CBG: No results for input(s): GLUCAP in the last 168 hours. Lipid Profile: No results for input(s): CHOL, HDL, LDLCALC, TRIG, CHOLHDL, LDLDIRECT in the last 72 hours. Thyroid Function Tests: No results for input(s): TSH, T4TOTAL, FREET4, T3FREE, THYROIDAB in the last 72 hours. Anemia Panel: No results for input(s): VITAMINB12, FOLATE, FERRITIN, TIBC, IRON, RETICCTPCT in the last 72 hours. Urine analysis:    Component Value Date/Time   COLORURINE YELLOW 11/29/2016 1518   APPEARANCEUR CLEAR 11/29/2016 1518   LABSPEC 1.020 11/29/2016 1518   PHURINE 6.0 11/29/2016 1518   GLUCOSEU NEGATIVE 11/29/2016 1518   HGBUR SMALL (A) 11/29/2016 1518    BILIRUBINUR NEGATIVE 11/29/2016 1518   KETONESUR NEGATIVE 11/29/2016 1518   PROTEINUR NEGATIVE 06/07/2014 1031   UROBILINOGEN 0.2 11/29/2016 1518   NITRITE NEGATIVE 11/29/2016 1518   LEUKOCYTESUR NEGATIVE 11/29/2016 1518   Recent Results (from the past 240 hour(s))  Culture, blood (routine x 2) Call MD if unable to obtain prior to antibiotics being given     Status: None   Collection Time: 12/31/16  6:58 PM  Result Value Ref Range Status   Specimen Description BLOOD RIGHT ANTECUBITAL  Final   Special Requests BOTTLES DRAWN AEROBIC ONLY 5CC  Final   Culture NO GROWTH 5 DAYS  Final   Report Status 01/05/2017 FINAL  Final  Culture, blood (routine x 2) Call MD if unable to obtain prior to antibiotics being given     Status: None   Collection Time: 12/31/16  7:07 PM  Result Value Ref Range Status   Specimen Description  BLOOD LEFT ANTECUBITAL  Final   Special Requests BOTTLES DRAWN AEROBIC AND ANAEROBIC 5CC  Final   Culture NO GROWTH 5 DAYS  Final   Report Status 01/05/2017 FINAL  Final  Aerobic Culture (superficial specimen)     Status: None   Collection Time: 12/31/16 11:32 PM  Result Value Ref Range Status   Specimen Description BACK  Final   Special Requests Normal  Final   Gram Stain   Final    NO WBC SEEN RARE SQUAMOUS EPITHELIAL CELLS PRESENT NO ORGANISMS SEEN    Culture FEW STAPHYLOCOCCUS AUREUS  Final   Report Status 01/03/2017 FINAL  Final   Organism ID, Bacteria STAPHYLOCOCCUS AUREUS  Final      Susceptibility   Staphylococcus aureus - MIC*    CIPROFLOXACIN <=0.5 SENSITIVE Sensitive     ERYTHROMYCIN RESISTANT Resistant     GENTAMICIN <=0.5 SENSITIVE Sensitive     OXACILLIN 0.5 SENSITIVE Sensitive     TETRACYCLINE <=1 SENSITIVE Sensitive     VANCOMYCIN <=0.5 SENSITIVE Sensitive     TRIMETH/SULFA <=10 SENSITIVE Sensitive     CLINDAMYCIN RESISTANT Resistant     RIFAMPIN <=0.5 SENSITIVE Sensitive     Inducible Clindamycin POSITIVE Resistant     * FEW STAPHYLOCOCCUS  AUREUS      Radiology Studies: No results found.  Scheduled Meds: . carisoprodol  350 mg Oral TID  . carvedilol  12.5 mg Oral BID WC  . doxepin  25 mg Oral BID  . fluticasone furoate-vilanterol  1 puff Inhalation Daily  . hydrocortisone  1 application Topical BID  . levothyroxine  25 mcg Oral QAC breakfast  . lidocaine  1 patch Transdermal Q24H  . mupirocin cream   Topical BID  . pantoprazole  40 mg Oral Daily  . potassium chloride  30 mEq Oral BID  . saccharomyces boulardii  250 mg Oral BID  . sertraline  200 mg Oral QHS  . sodium chloride flush  3 mL Intravenous Q12H  . tamsulosin  0.8 mg Oral QPC breakfast  . torsemide  60 mg Oral BID  . warfarin  7.5 mg Oral ONCE-1800  . Warfarin - Pharmacist Dosing Inpatient   Does not apply q1800  . zolpidem  5 mg Oral QHS,MR X 1   Continuous Infusions:   LOS: 7 days   Time spent: 25 minutes.  Vance Gather, MD [facility] Pager [phone]  If 7PM-7AM, please contact night-coverage [email or web address] Password [email or web address] 01/07/2017, 3:25 PM

## 2017-01-08 ENCOUNTER — Other Ambulatory Visit: Payer: Self-pay | Admitting: Internal Medicine

## 2017-01-08 DIAGNOSIS — Z7901 Long term (current) use of anticoagulants: Secondary | ICD-10-CM

## 2017-01-08 LAB — BASIC METABOLIC PANEL
Anion gap: 10 (ref 5–15)
BUN: 32 mg/dL — AB (ref 6–20)
CALCIUM: 9.3 mg/dL (ref 8.9–10.3)
CO2: 37 mmol/L — AB (ref 22–32)
Chloride: 85 mmol/L — ABNORMAL LOW (ref 101–111)
Creatinine, Ser: 1.52 mg/dL — ABNORMAL HIGH (ref 0.61–1.24)
GFR calc Af Amer: 55 mL/min — ABNORMAL LOW (ref >60)
GFR, EST NON AFRICAN AMERICAN: 47 mL/min — AB (ref >60)
GLUCOSE: 99 mg/dL (ref 65–99)
Potassium: 3.7 mmol/L (ref 3.5–5.1)
Sodium: 132 mmol/L — ABNORMAL LOW (ref 135–145)

## 2017-01-08 LAB — PROTIME-INR
INR: 1.33
PROTHROMBIN TIME: 16.6 s — AB (ref 11.4–15.2)

## 2017-01-08 MED ORDER — CARVEDILOL 12.5 MG PO TABS: 12.5000 mg | ORAL_TABLET | Freq: Two times a day (BID) | ORAL | 0 refills | Status: DC

## 2017-01-08 MED ORDER — WARFARIN SODIUM 7.5 MG PO TABS: 7.5000 mg | ORAL_TABLET | Freq: Once | ORAL | Status: DC

## 2017-01-08 MED ORDER — TAMSULOSIN HCL 0.4 MG PO CAPS: 0.8000 mg | ORAL_CAPSULE | Freq: Every day | ORAL | 0 refills | Status: DC

## 2017-01-08 NOTE — Progress Notes (Signed)
Progress Note  Patient Name: [name] Date of Encounter: 01/08/2017  Primary Cardiologist: [name]  Subjective   Feeling back to baseline. No orthopnea. Edema resolved.  Inpatient Medications    Scheduled Meds: . carisoprodol  350 mg Oral TID  . carvedilol  12.5 mg Oral BID WC  . doxepin  25 mg Oral BID  . fluticasone furoate-vilanterol  1 puff Inhalation Daily  . hydrocortisone  1 application Topical BID  . levothyroxine  25 mcg Oral QAC breakfast  . lidocaine  1 patch Transdermal Q24H  . mupirocin cream   Topical BID  . pantoprazole  40 mg Oral Daily  . saccharomyces boulardii  250 mg Oral BID  . sertraline  200 mg Oral QHS  . sodium chloride flush  3 mL Intravenous Q12H  . tamsulosin  0.8 mg Oral QPC breakfast  . torsemide  60 mg Oral BID  . warfarin  7.5 mg Oral ONCE-1800  . Warfarin - Pharmacist Dosing Inpatient   Does not apply q1800  . zolpidem  5 mg Oral QHS,MR X 1   Continuous Infusions:  PRN Meds: acetaminophen **OR** acetaminophen, albuterol, ALPRAZolam, alum & mag hydroxide-simeth, barrier cream, bisacodyl, hydrALAZINE, linaclotide, ondansetron, ondansetron, senna, traZODone   Vital Signs    Vitals:   01/07/17 0904 01/07/17 1717 01/07/17 2208 01/08/17 0532  BP: 120/72 115/62 (!) 95/59 103/70  Pulse:  69 92 83  Resp:   18 18  Temp:   97.5 F (36.4 C) 98 F (36.7 C)  TempSrc:   Oral Oral  SpO2:   94% 97%  Weight:    132.9 kg (293 lb)  Height:        Intake/Output Summary (Last 24 hours) at 01/08/17 1112 Last data filed at 01/08/17 0700  Gross per 24 hour  Intake                0 ml  Output              400 ml  Net             -400 ml   Filed Weights   01/06/17 1132 01/07/17 0557 01/08/17 0532  Weight: 131.7 kg (290 lb 4.8 oz) 132.9 kg (292 lb 15.9 oz) 132.9 kg (293 lb)    Telemetry    Afib, controlled rate - Personally Reviewed Ventricular rate mostly in 70-80s  Physical Exam  Comfortable GEN: No acute distress.   Neck: No  JVD Cardiac: irregular, no murmurs, rubs, or gallops.  Respiratory: Clear to auscultation bilaterally. GI: Soft, nontender, non-distended  MS: No edema; No deformity. Neuro:  Nonfocal  Psych: Normal affect   Labs    Chemistry Recent Labs Lab 01/06/17 0406 01/07/17 0637 01/08/17 0714  NA 132* 130* 132*  K 3.1* 3.3* 3.7  CL 83* 82* 85*  CO2 38* 37* 37*  GLUCOSE 99 98 99  BUN 30* 28* 32*  CREATININE 1.56* 1.47* 1.52*  CALCIUM 9.2 9.2 9.3  GFRNONAA 46* 49* 47*  GFRAA 53* 57* 55*  ANIONGAP 11 11 10      Hematology Recent Labs Lab 01/07/17 0637  WBC 8.8  RBC 4.74  HGB 13.9  HCT 41.1  MCV 86.7  MCH 29.3  MCHC 33.8  RDW 13.8  PLT 234    Cardiac Studies   2D Echo 01/04/17 Study Conclusions  - Left ventricle: The cavity size was severely dilated. There was mild concentric hypertrophy. Systolic function was severely reduced. The estimated ejection fraction was in  the range of 10-15%. Diffuse hypokinesis. The study was not technically sufficient to allow evaluation of LV diastolic dysfunction due to atrial fibrillation. - Ventricular septum: Septal motion showed paradox. - Aortic valve: There was trivial regurgitation. - Mitral valve: There was mild regurgitation. - Left atrium: The atrium was severely dilated. - Right ventricle: The cavity size was moderately dilated. Wall thickness was normal. Systolic function was severely reduced. - Right atrium: The atrium was severely dilated. - Tricuspid valve: There was moderate regurgitation. - Pulmonary arteries: Systolic pressure was moderately increased. PA peak pressure: 45 mm Hg (S). - Impressions: Compared to the prior study on 08/29/2015 LVEF has decreased from 30-35% to 10-15%. RVEF is also severely decreased.  Impressions:  - Compared to the prior study on 08/29/2015 LVEF has decreased from 30-35% to 10-15%. RVEF is also severely decreased.  Patient Profile     61 y.o. male with   Severe biventricular failure with acute exacerbation, permanent atrial fibrillation, HTN with decrease in LVEF, possibly explained in part by tachycardia related CMP after discontinuation of amiodarone.  Assessment & Plan    Maintaining stable fluid status, apparent euvolemia clinically on oral diuretics. Tolerating higher dose of carvedilol, well rate controlled. Stable renal function. BP borderline right now. Need to consider RAAS inhibitor (preferably Entresto) if BP allows. Will need reassessment of LVEF after 2-3 months of good rate control. If this cannot be achieved, consider AV node ablation and CRT. He has discussed CRT with Dr. [name] in the past - I think it was felt he might not respond due to absence of typical LBBB morphology. Has appt next week with Truitt Merle.  Signed, Sanda Klein, MD  01/08/2017, 11:12 AM

## 2017-01-08 NOTE — Discharge Summary (Signed)
Physician Discharge Summary  [name] GMW:[id number] DOB: [birth year] DOA: 12/31/2016  PCP: [name], MD  Admit date: 12/31/2016 Discharge date: 01/08/2017  Admitted From: Home Disposition: Home   Recommendations for Outpatient Follow-up:  1. Follow up with PCP in 1-2 weeks.  2. Follow up LUTS: Started flomax while in the hospital for weak stream.  3. Monitor heart rate/rhythm. Stopped diltiazem and changed bystolic to coreg with good rate control and stable BP. 4. Monitor volume status. Continue torsemide 60mg  BID (was only taking ocne daily). Presumed dry weight at discharge is 293lbs.  5. Please obtain BMP/CBC in one week  Home Health: None recommended Equipment/Devices: 3 L O2 Discharge Condition: Stable CODE STATUS: Full Diet recommendation: Heart healthy  Brief/Interim Summary: [name] a 61 y.o.male with a history of diastolic CHF with last TEE in 2016 demonstrating EF 50-55%, asthma, COPD, chronic respiratory failure on intermittent use of nocturnal oxygen, atrial fibrillation s/p DCCV in 2016,on coumadin, s/p PPM, HTN, HLD, BPH, and GERD who presented to the emergency department with complaints of progressive worsening of shortness of breath and weakness present for weeks worsening over previous 48 hours, having woken up feeling smothered. He called EMS, but refused transport due to cost, and later had cardiology remotely interrogate PPM. They found an accelerated heart rate 170 bpm and referred him to the ED for further evaluation. Upon arrival he was tachypneic with respirations of 22, heart rate was 145 and ECG showed atopy with RBBB and left fascicular block, and nonspecific ST- T wave abnormality. He was hypoxic, improved on 3 L O2. Blood work was unremarkable, CXR showed posterior right base infiltrate with small right pleural effusion suspicious for pneumonia superimposed on underlying pulmonary vascular congestion. The patient was admitted for acute on chronic  respiratory failure secondary to CAP and decompensated CHF resulting in Afib w/ RVR. A full course of antibiotics were given with negative cultures and resolution of all symptoms of infection. Rate has been well-controlled. Lasix 80mg  IV BID was started with limited diuresis, so this was augmented. Updated echocardiogram showed interval worsening of ejection fraction. Cardiology has been assisting with diuresis, including metolazone with reduction in weight and edema. The patient's respiratory status has improved significantly and remained stable on his home dose of torsemide 60mg  BID which he will restart at discharge. Follow up is scheduled with cardiology.   Discharge Diagnoses:  Principal Problem:   CAP (community acquired pneumonia) Active Problems:   Essential hypertension   GERD   Anticoagulated on warfarin   COPD (chronic obstructive pulmonary disease) (HCC)   Hyperlipidemia   Atrial fibrillation (HCC)   Acute on chronic respiratory failure (HCC)   Acute on chronic systolic heart failure (HCC)  Acute on chronic hypoxemic respiratory failure: Multifactorial, associated with CAP, CHF exacerbation superimposed on COPD. Patient uses nocturnal O2 at home intermittently. Currently O2 requirement Improving.  - Continue to treat underlying issues and wean off O2 as tolerated - Ambulate w/pulse ox tomorrow.   CAP: RLL with associated small effusion. Strep pneumo Ag negative, HIV NR. No leukocytosis or fever. Finished abx 3/25. Blood cultures negative.  - Continue incentive spirometry, expectorant prn - Recommend follow up CXR as outpatient to ensure resolution of infiltrate and effusion.   Acute on chronic CHF: Update echocardiogram showed interval worsening. Compared to the prior study on 08/29/2015 LVEF has decreased from30-35% to 10-15%.RVEF is also severely decreased. Volume overload due to decreasing torsemide dose to 60mg  daily (instead of BID) at home due to  frequent urination. EDW  initially thought to be ~302lbs. 310lbs at admission. BNP 395. Tn neg. EDW at discharge is 293lbs.  - Required lasix 160mg  IV BID + metolazone daily for effective diuresis initially. Will restart home torsemide per cardiology.  - Stop CCB w/low EF. Increasing coreg. Could consider adding digoxin if rate becomes uncontrolled. Intermittently bradycardic.  - Would ideally add low dose ACE inhibitor once renal function is stable.  - Will need repeat echo 3 months after medication optimization and AICD placement if EF remains <35%.  Afib: Currently rate controlled. - Continue coumadin. INR 1.96 on admission. - Keep K > 4, repleting  - Changed nebivolol to coreg per cardiology and stopped diltiazem due to low EF.   GERD: Chronic, stable - Continue PPI  Hypertension: Chronic, stable - Continue diuretic, coreg. Stopped dilt as above.   Dermatitis: Pruritic, nonspecific. Has been biopsied by dermatology as outpatient. No disease-specific therapy was advised. No evidence of bacterial infection. Superficial cultures were ordered at admission, showing MSSA, not thought to be true infection.  - Topical treatments, most important intervention is aimed at preventing scratching (dressings working well). - Follow up with dermatology.  Hypothyroidism: most recent TSH 1.48 on 11/29/2016 - Continue home synthroid  Muscle spasms: Reordered home medications. Stable.  BPH:  - Restarted flomax, still with weak stream per pt. Not retaining per bladder scan 3/22. Will increase to 0.8mg . Monitor for orthostatic hypotension.   Discharge Instructions Discharge Instructions    (HEART FAILURE PATIENTS) Call MD:  Anytime you have any of the following symptoms: 1) 3 pound weight gain in 24 hours or 5 pounds in 1 week 2) shortness of breath, with or without a dry hacking cough 3) swelling in the hands, feet or stomach 4) if you have to sleep on extra pillows at night in order to breathe.    Complete by:  As  directed    Diet - low sodium heart healthy    Complete by:  As directed    Discharge instructions    Complete by:  As directed    You were admitted with acute systolic heart failure and have diuresed well. Your dry weight at discharge is 293lbs. You are stable for discharge with the following recommendations:  - Keep taking torsemide 60mg  twice daily.  - Stop taking diltiazem and bystolic. These have been replaced with coreg, which you will take twice daily.  - To help with urination, flomax was started. You can continue taking this as directed and ask your PCP at follow up whether you should continue taking this.  - You have follow up scheduled with the cardiologist's office and should schedule follow up with your PCP in the next 1 - 2 weeks.  - If your symptoms return, seek medical attention right away.   Increase activity slowly    Complete by:  As directed      Allergies as of 01/08/2017      Reactions   Amiodarone Other (See Comments)   hyperthyroidism   Pravastatin Sodium Other (See Comments)   myalgia   Simvastatin Other (See Comments)   severe leg cramps   Statins Other (See Comments)   myalgia   Tape Other (See Comments)   Skin Tears Use Paper Tape Only      Medication List    STOP taking these medications   BYSTOLIC 5 MG tablet Generic drug:  nebivolol   diltiazem 120 MG 24 hr capsule Commonly known as:  CARDIZEM CD  ibuprofen 200 MG tablet Commonly known as:  ADVIL,MOTRIN     TAKE these medications   ADVAIR DISKUS 250-50 MCG/DOSE Aepb Generic drug:  Fluticasone-Salmeterol INHALE 1 PUFF EVERY DAY AS DIRECTED   albuterol 108 (90 Base) MCG/ACT inhaler Commonly known as:  PROVENTIL HFA;VENTOLIN HFA Inhale 2 puffs into the lungs 4 (four) times daily. Also may Inhale 2 puffs into the lungs 4 times a day as needed for wheezing   ALPRAZolam 0.5 MG tablet Commonly known as:  XANAX 1-2 tab by mouth at bedtime as needed for sleep   carisoprodol 350 MG  tablet Commonly known as:  SOMA TAKE 1 TABLET BY MOUTH 3 TIMES A DAY AS NEEDED FOR MUSCLE SPASMS   carvedilol 12.5 MG tablet Commonly known as:  COREG Take 1 tablet (12.5 mg total) by mouth 2 (two) times daily with a meal.   diphenhydrAMINE 25 MG tablet Commonly known as:  BENADRYL Take 25 mg by mouth every 6 (six) hours as needed for itching.   doxepin 25 MG capsule Commonly known as:  SINEQUAN TAKE ONE CAPSULE BY MOUTH TWICE A DAY   fluocinonide cream 0.05 % Commonly known as:  LIDEX Apply 1 application topically 2 (two) times daily.   hydrocortisone 2.5 % cream Apply 1 application topically 2 (two) times daily. itching   hydrOXYzine 25 MG tablet Commonly known as:  ATARAX/VISTARIL Take 25 mg by mouth daily as needed for itching.   levothyroxine 25 MCG tablet Commonly known as:  SYNTHROID, LEVOTHROID TAKE 1 TABLET BY MOUTH EVERY DAY BEFORE BREAKFAST   lidocaine 5 % Commonly known as:  LIDODERM Apply 1 patch every 12 hrs to affected area. Do not use more than one patch in 24 hrs.   linaclotide 145 MCG Caps capsule Commonly known as:  LINZESS Take 145 mcg by mouth daily as needed (Crampy pain).   omeprazole 20 MG capsule Commonly known as:  PRILOSEC Take 1 capsule (20 mg total) by mouth daily with supper.   potassium chloride SA 20 MEQ tablet Commonly known as:  K-DUR,KLOR-CON Take 40 mEq by mouth daily.   sertraline 100 MG tablet Commonly known as:  ZOLOFT TAKE 2 TABLETS BY MOUTH AT BEDTIME   silver sulfADIAZINE 1 % cream Commonly known as:  SILVADENE APPLY TO AFFECTED AREA TOPICALLY EVERY DAY   tamsulosin 0.4 MG Caps capsule Commonly known as:  FLOMAX Take 2 capsules (0.8 mg total) by mouth daily after breakfast. Start taking on:  01/09/2017   torsemide 20 MG tablet Commonly known as:  DEMADEX Take 3 tablets (60 mg total) by mouth 2 (two) times daily.   traZODone 100 MG tablet Commonly known as:  DESYREL TAKE 1/2 TABLET BY MOUTH AT BEDTIME AS  NEEDED   TUMS 500 MG chewable tablet Generic drug:  calcium carbonate Chew 500 mg by mouth daily as needed.   warfarin 5 MG tablet Commonly known as:  COUMADIN Take as directed by anticoagulation clinic What changed:  how much to take  how to take this  when to take this  additional instructions   zolpidem 10 MG tablet Commonly known as:  AMBIEN TAKE 1 TABLET BY MOUTH AT BEDTIME            Durable Medical Equipment        Start     Ordered   01/08/17 0950  DME Oxygen  Once    Question Answer Comment  Mode or (Route) Nasal cannula   Liters per Minute 3   Frequency  Continuous (stationary and portable oxygen unit needed)   Oxygen delivery system Gas      01/08/17 0949     Follow-up Information    Truitt Merle, NP Follow up on 01/15/2017.   Specialties:  Nurse Practitioner, Interventional Cardiology, Cardiology, Radiology Why:  Please arrive at 8:45 am for a 9:00 am appointment. Contact information: [place]. [address] [place] [address] [phone]        [name], MD. Schedule an appointment as soon as possible for a visit.   Specialties:  Internal Medicine, Radiology Contact information: [address]  [place] [address] [phone]          Allergies  Allergen Reactions  . Amiodarone Other (See Comments)    hyperthyroidism  . Pravastatin Sodium Other (See Comments)    myalgia  . Simvastatin Other (See Comments)    severe leg cramps  . Statins Other (See Comments)    myalgia  . Tape Other (See Comments)    Skin Tears Use Paper Tape Only    Consultations:  Cardiology  Procedures/Studies: Dg Chest 2 View  Result Date: 12/31/2016 CLINICAL DATA:  Shortness of breath with lower extremity and upper extremity edema EXAM: CHEST  2 VIEW COMPARISON:  August 31, 2015 FINDINGS: There is a small right pleural effusion with patchy focal opacity in the posterior right base, felt to represent a small area of pneumonia.  Lungs elsewhere are clear. There is cardiomegaly with pulmonary venous hypertension. No adenopathy. There is a loop recorder on the left. There is degenerative change in the thoracic spine. IMPRESSION: Underlying pulmonary vascular congestion. Small focus of infiltrate posterior right base with small right pleural effusion. Suspect localized pneumonia in this area. No evident adenopathy. Followup PA and lateral chest radiographs recommended in 3-4 weeks following trial of antibiotic therapy to ensure resolution and exclude underlying malignancy. Electronically Signed   By: Lowella Grip III M.D.   On: 12/31/2016 13:00     ECHOCARDIOGRAM 01/04/2017: - Left ventricle: The cavity size was severely dilated. There was mild concentric hypertrophy. Systolic function was severely reduced. The estimated ejection fraction was in the range of 10-15%. Diffuse hypokinesis. The study was not technically sufficient to allow evaluation of LV diastolic dysfunction due to atrial fibrillation. - Ventricular septum: Septal motion showed paradox. - Aortic valve: There was trivial regurgitation. - Mitral valve: There was mild regurgitation. - Left atrium: The atrium was severely dilated. - Right ventricle: The cavity size was moderately dilated. Wall thickness was normal. Systolic function was severely reduced. - Right atrium: The atrium was severely dilated. - Tricuspid valve: There was moderate regurgitation. - Pulmonary arteries: Systolic pressure was moderately increased. PA peak pressure: 45 mm Hg (S). - Impressions: Compared to the prior study on 08/29/2015 LVEF has decreased from 30-35% to 10-15%. RVEF is also severely decreased.  Impressions: - Compared to the prior study on 08/29/2015 LVEF has decreased from 30-35% to 10-15%. RVEF is also severely decreased.  Subjective: Pt feeling much better, swelling significantly improved. Ambulating and eating normally. No CP,  palpitations.   Discharge Exam: Vitals:   01/07/17 2208 01/08/17 0532  BP: (!) 95/59 103/70  Pulse: 92 83  Resp: 18 18  Temp: 97.5 F (36.4 C) 98 F (36.7 C)   General: Morbidly obese, pleasant in no distress Cardiovascular: Irreg, no murmur or JVD Respiratory: Distant but clear, nonlabored.  Extremities: No edema  The results of significant diagnostics from this hospitalization (including imaging, microbiology, ancillary and  laboratory) are listed below for reference.    Labs: BNP (last 3 results)  Recent Labs  12/31/16 1136  BNP 258.5*   Basic Metabolic Panel:  Recent Labs Lab 01/04/17 0514 01/05/17 0421 01/06/17 0406 01/07/17 0637 01/08/17 0714  NA 136 136 132* 130* 132*  K 4.7 3.8 3.1* 3.3* 3.7  CL 96* 90* 83* 82* 85*  CO2 31 36* 38* 37* 37*  GLUCOSE 84 91 99 98 99  BUN 23* 31* 30* 28* 32*  CREATININE 1.32* 1.38* 1.56* 1.47* 1.52*  CALCIUM 8.5* 9.1 9.2 9.2 9.3   Liver Function Tests: No results for input(s): AST, ALT, ALKPHOS, BILITOT, PROT, ALBUMIN in the last 168 hours. No results for input(s): LIPASE, AMYLASE in the last 168 hours. No results for input(s): AMMONIA in the last 168 hours. CBC:  Recent Labs Lab 01/07/17 0637  WBC 8.8  HGB 13.9  HCT 41.1  MCV 86.7  PLT 234   Microbiology Recent Results (from the past 240 hour(s))  Culture, blood (routine x 2) Call MD if unable to obtain prior to antibiotics being given     Status: None   Collection Time: 12/31/16  6:58 PM  Result Value Ref Range Status   Specimen Description BLOOD RIGHT ANTECUBITAL  Final   Special Requests BOTTLES DRAWN AEROBIC ONLY 5CC  Final   Culture NO GROWTH 5 DAYS  Final   Report Status 01/05/2017 FINAL  Final  Culture, blood (routine x 2) Call MD if unable to obtain prior to antibiotics being given     Status: None   Collection Time: 12/31/16  7:07 PM  Result Value Ref Range Status   Specimen Description BLOOD LEFT ANTECUBITAL  Final   Special Requests BOTTLES DRAWN  AEROBIC AND ANAEROBIC 5CC  Final   Culture NO GROWTH 5 DAYS  Final   Report Status 01/05/2017 FINAL  Final  Aerobic Culture (superficial specimen)     Status: None   Collection Time: 12/31/16 11:32 PM  Result Value Ref Range Status   Specimen Description BACK  Final   Special Requests Normal  Final   Gram Stain   Final    NO WBC SEEN RARE SQUAMOUS EPITHELIAL CELLS PRESENT NO ORGANISMS SEEN    Culture FEW STAPHYLOCOCCUS AUREUS  Final   Report Status 01/03/2017 FINAL  Final   Organism ID, Bacteria STAPHYLOCOCCUS AUREUS  Final      Susceptibility   Staphylococcus aureus - MIC*    CIPROFLOXACIN <=0.5 SENSITIVE Sensitive     ERYTHROMYCIN RESISTANT Resistant     GENTAMICIN <=0.5 SENSITIVE Sensitive     OXACILLIN 0.5 SENSITIVE Sensitive     TETRACYCLINE <=1 SENSITIVE Sensitive     VANCOMYCIN <=0.5 SENSITIVE Sensitive     TRIMETH/SULFA <=10 SENSITIVE Sensitive     CLINDAMYCIN RESISTANT Resistant     RIFAMPIN <=0.5 SENSITIVE Sensitive     Inducible Clindamycin POSITIVE Resistant     * FEW STAPHYLOCOCCUS AUREUS    Time coordinating discharge: Approximately 40 minutes  Vance Gather, MD  [facility] 01/08/2017, 9:49 AM Pager [phone]  If 7PM-7AM, please contact night-coverage [email or web address] Password [email or web address]

## 2017-01-08 NOTE — Progress Notes (Signed)
ANTICOAGULATION CONSULT NOTE - Follow-Up Consult  Pharmacy Consult for Warfarin Indication: atrial fibrillation  Allergies  Allergen Reactions  . Amiodarone Other (See Comments)    hyperthyroidism  . Pravastatin Sodium Other (See Comments)    myalgia  . Simvastatin Other (See Comments)    severe leg cramps  . Statins Other (See Comments)    myalgia  . Tape Other (See Comments)    Skin Tears Use Paper Tape Only    Patient Measurements: Height: 5\' 10"  (177.8 cm) Weight: 293 lb (132.9 kg) IBW/kg (Calculated) : 73  Vital Signs: Temp: 98 F (36.7 C) (03/27 0532) Temp Source: Oral (03/27 0532) BP: 103/70 (03/27 0532) Pulse Rate: 83 (03/27 0532)  Labs:  Recent Labs  01/06/17 0406 01/07/17 0637 01/08/17 0714  HGB  --  13.9  --   HCT  --  41.1  --   PLT  --  234  --   LABPROT 21.5* 18.5* 16.6*  INR 1.84 1.52 1.33  CREATININE 1.56* 1.47* 1.52*    Estimated Creatinine Clearance (by C-G formula based on SCr of 1.52 mg/dL (H)) Male: 57.1 mL/min (A) Male: 69.1 mL/min (A)  Assessment: Patient is a 61 yo M admitted 3/19 w/ worsening shortness of breath and weakness. Patient is on warfarin PTA for a fib. Pharmacy was consulted to dose while inpatient.   PTA warfarin dose: 2.5mg  Mon and Fri, 5mg  all other days per patient discussion and chart review.  Patient did not receive dose on 3/25, INR trending down and remains subtherapeutic at 1.33. Pt had Levaquin daily 3/22-26, had azithromycin 3/19-3/21. No bleeding noted, PO intake good.  Goal of Therapy:  INR 2-3 Monitor platelets by anticoagulation protocol: Yes   Plan:  Repeat warfarin 7.5 mg po x 1 today Monitor daily INR, CBC, clinical course, s/sx of bleed, PO intake, DDI   Thank you for allowing [place] to participate in this patients care.  Jens Som, PharmD Clinical phone for 01/08/2017 from 7a-3:30p: [phone] If after 3:30p, please call main pharmacy at: [phone] 01/08/2017 10:45 AM

## 2017-01-08 NOTE — Progress Notes (Signed)
   01/07/17 0720  Oxygen Therapy  SpO2 (!) 89 %  O2 Device Room [employer]

## 2017-01-08 NOTE — Progress Notes (Signed)
NURSING PROGRESS NOTE  [name] [id number] Discharge Data: 01/08/2017 2:40 PM Attending Provider: Patrecia Pour, MD JSC:BIPJR [name], MD   [name] to be D/C'd Home per MD order.    All IV's will be discontinued and monitored for bleeding.  All belongings will be returned to patient for patient to take home.  Last Documented Vital Signs:  Blood pressure 103/70, pulse 83, temperature 98 F (36.7 C), temperature source Oral, resp. rate 18, height 5\' 10"  (1.778 m), weight 132.9 kg (293 lb), SpO2 97 %.  [name], MSN, RN, [employer]

## 2017-01-08 NOTE — Care Management Note (Signed)
Case Management Note  Patient Details  Name: [name] MRN: [id number] Date of Birth: Feb 02, 1956  Subjective/Objective:       Admitted with CAP.             Action/Plan: Plan is d/c to home today.    Expected Discharge Date:  01/08/17               Expected Discharge Plan:  Home/Self Care  In-House Referral:     Discharge planning Services  CM Consult  Post Acute Care Choice:  Resumption of Svcs/PTA Provider Choice offered to:  Patient  DME Arranged:  Oxygen DME Agency:   Huey Romans / [place] faxed oxygen order to [place] @ [phone].  HH Arranged:    [place] Agency:     Status of Service:  Completed, signed off  If discussed at [place] of Stay Meetings, dates discussed:    Additional Comments:  Sharin Mons, RN 01/08/2017, 2:11 PM

## 2017-01-10 ENCOUNTER — Other Ambulatory Visit: Payer: Self-pay

## 2017-01-10 DIAGNOSIS — G4733 Obstructive sleep apnea (adult) (pediatric): Secondary | ICD-10-CM | POA: Diagnosis not present

## 2017-01-11 NOTE — Patient Outreach (Addendum)
New Referral for transition of care:  Patient well known to me.  Discharged on 01/08/2017.  Call made on 01/10/2017.   Patient reports that he stopped taking some of his torsemide due to the amount of urination and urinary accidents. Reports large weight gain with swelling and shortness of breath.Then reports increase heart rate in 170's.    Reports hospital removed 8 liters of fluid. Patient reports now that he is weak and dry.  Complaining of thirst.  Reports that he has learned his lesson not to stop the fluid pill as her prefers not to go back to the hospital again.   Patient has cardiology follow up planned on 01/15/2017 and primary follow up on 01/17/2017.  Patient denies any "racing heart or shortness of breath" since discharge from hospital.  Reports daily weights and low salt diet. Reports following fluid restriction. Vitals:   01/10/17 0907  Weight: 290 lb 3.2 oz (131.6 kg)    Outpatient Encounter Prescriptions as of 01/10/2017  Medication Sig  . ADVAIR DISKUS 250-50 MCG/DOSE AEPB INHALE 1 PUFF EVERY DAY AS DIRECTED  . albuterol (PROVENTIL HFA;VENTOLIN HFA) 108 (90 BASE) MCG/ACT inhaler Inhale 2 puffs into the lungs 4 (four) times daily. Also may Inhale 2 puffs into the lungs 4 times a day as needed for wheezing  . ALPRAZolam (XANAX) 0.5 MG tablet 1-2 tab by mouth at bedtime as needed for sleep  . carisoprodol (SOMA) 350 MG tablet TAKE 1 TABLET BY MOUTH 3 TIMES A DAY AS NEEDED FOR MUSCLE SPASMS  . carvedilol (COREG) 12.5 MG tablet Take 1 tablet (12.5 mg total) by mouth 2 (two) times daily with a meal.  . fluocinonide cream (LIDEX) 0.10 % Apply 1 application topically 2 (two) times daily.  . hydrocortisone 2.5 % cream Apply 1 application topically 2 (two) times daily. itching  . hydrOXYzine (ATARAX/VISTARIL) 25 MG tablet Take 25 mg by mouth daily as needed for itching.  [name] KLOR-CON M20 20 MEQ tablet TAKE 2 TABLETS BY MOUTH EVERY DAY  . levothyroxine (SYNTHROID, LEVOTHROID) 25 MCG tablet  TAKE 1 TABLET BY MOUTH EVERY DAY BEFORE BREAKFAST  . lidocaine (LIDODERM) 5 % Apply 1 patch every 12 hrs to affected area. Do not use more than one patch in 24 hrs.  . Linaclotide (LINZESS) 145 MCG CAPS capsule Take 145 mcg by mouth daily as needed (Crampy pain).   [name] omeprazole (PRILOSEC) 20 MG capsule Take 1 capsule (20 mg total) by mouth daily with supper.  . sertraline (ZOLOFT) 100 MG tablet TAKE 2 TABLETS BY MOUTH AT BEDTIME  . silver sulfADIAZINE (SILVADENE) 1 % cream APPLY TO AFFECTED AREA TOPICALLY EVERY DAY  . tamsulosin (FLOMAX) 0.4 MG CAPS capsule Take 2 capsules (0.8 mg total) by mouth daily after breakfast.  . torsemide (DEMADEX) 20 MG tablet Take 3 tablets (60 mg total) by mouth 2 (two) times daily.  . TUMS 500 MG chewable tablet Chew 500 mg by mouth daily as needed.  . warfarin (COUMADIN) 5 MG tablet Take as directed by anticoagulation clinic (Patient taking differently: Take 2.5-5 mg by mouth See admin instructions. 2.5mg  on Mondays and Fridays, 5mg  all other days)  . zolpidem (AMBIEN) 10 MG tablet TAKE 1 TABLET BY MOUTH AT BEDTIME  . diphenhydrAMINE (BENADRYL) 25 MG tablet Take 25 mg by mouth every 6 (six) hours as needed for itching.  [name] doxepin (SINEQUAN) 25 MG capsule TAKE ONE CAPSULE BY MOUTH TWICE A DAY (Patient not taking: Reported on 01/11/2017)  . potassium chloride SA (  K-DUR,KLOR-CON) 20 MEQ tablet Take 40 mEq by mouth daily.  . traZODone (DESYREL) 100 MG tablet TAKE 1/2 TABLET BY MOUTH AT BEDTIME AS NEEDED (Patient not taking: Reported on 01/11/2017)   No facility-administered encounter medications on file as of 01/10/2017.    PLAN: reviewed Transition of care program.  Reviewed discharge plan and medications.  Reviewed new estimated dry weight and encouraged patient to maintain this weight.  Offered home visit and patient has accepted for 01/14/2017 . Confirmed address.  Will send barrier letter and this note to MD.   [facility] CM Care Plan Problem One     Most Recent Value   Care Plan Problem One  Recent admission for fluid overload.   Role Documenting the Problem One  Care Management Cecilia for Problem One  Active  [facility] Long Term Goal (31-90 days)  Patient will report no readmission in the next 60 days.   [facility] Long Term Goal Start Date  01/10/17  Interventions for Problem One Long Term Goal  Reviewed transition of care program and discharge instructions. planned home visit.   [facility] CM Short Term Goal #1 (0-30 days)  Patient will weigh daily and record for the next 30 days.  [facility] CM Short Term Goal #1 Start Date  01/10/17  Interventions for Short Term Goal #1  Reviewed the importance of daily weights. Reviewed estabilished estimated dry weight with patient.   [facility] CM Short Term Goal #2 (0-30 days)  Patient will attend follow up appointments with cardiology and primary MD in the next 10 days.   [facility] CM Short Term Goal #2 Start Date  01/10/17  Interventions for Short Term Goal #2  Reviewed pending appointments. Confirmed no transportation concerns.       Tomasa Rand, RN, BSN, CEN [facility] [employer] [phone]

## 2017-01-14 ENCOUNTER — Other Ambulatory Visit: Payer: Self-pay

## 2017-01-14 NOTE — Progress Notes (Signed)
CARDIOLOGY OFFICE NOTE  Date:  01/15/2017    [name] Date of Birth: 12/08/1955 Medical Record #[id number]  PCP:  [name], MD  Cardiologist:  [name]  Chief Complaint  Patient presents with  . Congestive Heart Failure  . Cardiomyopathy    Post hospital visit - seen for Dr. [name]    History of Present Illness: [name] is a 61 y.o. male who presents today for a post hospital visit. Seen for Dr. [name].   He has a history of diastolic CHF with last TEE in 2016 demonstrating EF 50-55%, asthma, COPD, chronic respiratory failure on intermittent use of nocturnal oxygen, atrial fibrillation s/p DCCV in 2016 - previously on amiodarone and now in permanent AF,on coumadin, s/p loop recorder for prior episode of syncope, HTN, HLD, BPH, and GERD. Nonobstructive CAD per cath from 2017 noted.   Last seen here back in September by Dr. [name].    He recently presented to the emergency department with complaints of progressive worsening of shortness of breath and weakness present for weeks worsening over previous 48 hours, having woken up feeling smothered. He called EMS, but refused transport due to cost, and later had cardiology remotely interrogate his loop. They found an accelerated heart rate 170 bpm and referred him to the ED for further evaluation. Upon arrival he was tachypneic with respirations of 22,heart rate was 145 and ECG showed atopy with RBBB and left fascicular block, and nonspecific ST- T wave abnormality.He was hypoxic, improved on 3 L O2. Blood work was unremarkable, CXR showed posterior right base infiltrate with small right pleural effusion suspicious for pneumonia superimposed on underlying pulmonary vascular congestion. The patient was admitted for acute on chronic respiratory failure secondary to CAP and decompensated CHF resulting in Afib w/ RVR. A full course of antibiotics was given with negative cultures and resolution of all symptoms of infection. Rate was  well-controlled. Lasix 80mg  IV BID was started with limited diuresis, so this was augmented. Updated echocardiogram showed interval worsening of ejection fraction.   Comes in today. Here with his daughter - [name]. He has been home about a week - feels short of breath. Weight already climbing back up - up 6 pounds by his scales and more by ours. Short of breath. More swelling in his legs. Belly feels bloated. Early satiety noted. Not really doing much. Still with BPH issues - seeing GU later this month. No chest pain. Very little dizziness. No syncope. Has oxygen prn at home. Getting INR later this week. [facility] is seeing as well. He has scales at home. Trying to do better with salt restriction.   Past Medical History:  Diagnosis Date  . Anemia    supposed to be taking Vit B but doesn't  . ANXIETY    takes Xanax nightly  . Arthritis   . ASTHMA    Albuterol prn and Advair daily;also takes Prednisone daily  . Asthma   . Atrial fibrillation ([place]) 09/06/2015  . Cardiomyopathy [facility])    a. EF 25% TEE July 2013; b. EF normalized 2015;  c. 03/2015 Echo: EF 40-45%, difrf HK, PASP 38 mmHg, Mild MR, sev LAE/RAE.  [name] Chronic constipation    takes OTC stool softener  . COPD (chronic obstructive pulmonary disease) ([place])   . DEPRESSION    takes Zoloft and Doxepin daily  . Diverticulitis   . DYSKINESIA, ESOPHAGUS   . Essential hypertension       . FIBROMYALGIA   . GERD (  gastroesophageal reflux disease)       . Glaucoma   . HYPERLIPIDEMIA    a. Intolerant to statins.  . INSOMNIA    takes Ambien nightly  . Myocardial infarction    a. 2012 Myoview notable for prior infarct;  b. 03/2015 Lexiscan CL: EF 37%, diff HK, small area of inferior infarct from apex to base-->Med Rx.  . Myocardial infarction   . O2 dependent    uses oxygen 2 and 1/2 liters per [place] at hs  . Paroxysmal atrial fibrillation (HCC)    a. CHA2DS2VASc = 3--> takes Coumadin;  b. 03/15/2015 Successful TEE/DCCV;  c. 03/2015 recurrent afib,  Amio d/c'd in setting of hyperthyroidism.  . Peripheral neuropathy ([place])   . Pneumonia 12/2016  . Rash and other nonspecific skin eruption 04/12/2009   no cause found saw dermatologists x 2 and allergist  . SLEEP APNEA, OBSTRUCTIVE    a. doesn't use CPAP  . Syncope    a. 03/2015 s/p MDT LINQ.  [name] Type II diabetes mellitus ([place])         Past Surgical History:  Procedure Laterality Date  . ACNE CYST REMOVAL     2 on back   . CARDIAC CATHETERIZATION N/A 03/21/2016   Procedure: Right/Left Heart Cath and Coronary Angiography;  Surgeon: Larey Dresser, MD;  Location: [place] [facility];  Service: Cardiovascular;  Laterality: N/A;  . CARDIOVERSION  04/18/2012   Procedure: CARDIOVERSION;  Surgeon: Fay Records, MD;  Location: [place];  Service: Cardiovascular;  Laterality: N/A;  . CARDIOVERSION  04/25/2012   Procedure: CARDIOVERSION;  Surgeon: Thayer Headings, MD;  Location: [place];  Service: Cardiovascular;  Laterality: N/A;  . CARDIOVERSION  04/25/2012   Procedure: CARDIOVERSION;  Surgeon: Fay Records, MD;  Location: [place];  Service: Cardiovascular;  Laterality: N/A;  . CARDIOVERSION  05/09/2012   Procedure: CARDIOVERSION;  Surgeon: Sherren Mocha, MD;  Location: [place];  Service: Cardiovascular;  Laterality: N/A;  changed from crenshaw to cooper by trish/leone-endo  . CARDIOVERSION N/A 03/15/2015   Procedure: CARDIOVERSION;  Surgeon: Thayer Headings, MD;  Location: [facility];  Service: Cardiovascular;  Laterality: N/A;  . COLONOSCOPY    . COLONOSCOPY WITH PROPOFOL N/A 10/21/2014   Procedure: COLONOSCOPY WITH PROPOFOL;  Surgeon: Ladene Artist, MD;  Location: [facility] ENDOSCOPY;  Service: Endoscopy;  Laterality: N/A;  . EP IMPLANTABLE DEVICE N/A 04/06/2015   Procedure: Loop Recorder Insertion;  Surgeon: Evans Lance, MD;  Location: [place] [facility];  Service: Cardiovascular;  Laterality: N/A;  . ESOPHAGOGASTRODUODENOSCOPY    . TEE WITHOUT CARDIOVERSION  04/25/2012   Procedure: TRANSESOPHAGEAL  ECHOCARDIOGRAM (TEE);  Surgeon: Thayer Headings, MD;  Location: [place];  Service: Cardiovascular;  Laterality: N/A;  . TEE WITHOUT CARDIOVERSION N/A 03/15/2015   Procedure: TRANSESOPHAGEAL ECHOCARDIOGRAM (TEE);  Surgeon: Thayer Headings, MD;  Location: [place];  Service: Cardiovascular;  Laterality: N/A;  . TONSILLECTOMY    . TOTAL KNEE ARTHROPLASTY Right 06/15/2014   Procedure: TOTAL KNEE ARTHROPLASTY;  Surgeon: Renette Butters, MD;  Location: [place];  Service: Orthopedics;  Laterality: Right;     Medications: Current Outpatient Prescriptions  Medication Sig Dispense Refill  . ADVAIR DISKUS 250-50 MCG/DOSE AEPB INHALE 1 PUFF EVERY DAY AS DIRECTED 60 each 3  . albuterol (PROVENTIL HFA;VENTOLIN HFA) 108 (90 BASE) MCG/ACT inhaler Inhale 2 puffs into the lungs 4 (four) times daily. Also may Inhale 2 puffs into the lungs 4 times a day as needed for  wheezing    . ALPRAZolam (XANAX) 0.5 MG tablet 1-2 tab by mouth at bedtime as needed for sleep 180 tablet 1  . carisoprodol (SOMA) 350 MG tablet TAKE 1 TABLET BY MOUTH 3 TIMES A DAY AS NEEDED FOR MUSCLE SPASMS 90 tablet 5  . carvedilol (COREG) 12.5 MG tablet Take 1 tablet (12.5 mg total) by mouth 2 (two) times daily with a meal. 60 tablet 0  . hydrocortisone 2.5 % cream Apply 1 application topically 2 (two) times daily. itching    . levothyroxine (SYNTHROID, LEVOTHROID) 25 MCG tablet TAKE 1 TABLET BY MOUTH EVERY DAY BEFORE BREAKFAST 90 tablet 2  . lidocaine (LIDODERM) 5 % Apply 1 patch every 12 hrs to affected area. Do not use more than one patch in 24 hrs. 100 patch 1  . Linaclotide (LINZESS) 145 MCG CAPS capsule Take 145 mcg by mouth daily as needed (Crampy pain).     [name] omeprazole (PRILOSEC) 20 MG capsule Take 1 capsule (20 mg total) by mouth daily with supper. 90 capsule 1  . potassium chloride SA (K-DUR,KLOR-CON) 20 MEQ tablet Take 40 mEq by mouth daily.    . sertraline (ZOLOFT) 100 MG tablet TAKE 2 TABLETS BY MOUTH AT BEDTIME 180 tablet 3    . silver sulfADIAZINE (SILVADENE) 1 % cream APPLY TO AFFECTED AREA TOPICALLY EVERY DAY 50 g 3  . tamsulosin (FLOMAX) 0.4 MG CAPS capsule Take 2 capsules (0.8 mg total) by mouth daily after breakfast. 60 capsule 0  . torsemide (DEMADEX) 20 MG tablet Take 3 tablets (60 mg total) by mouth 2 (two) times daily. 180 tablet 3  . TUMS 500 MG chewable tablet Chew 500 mg by mouth daily as needed.    . warfarin (COUMADIN) 5 MG tablet Take as directed by anticoagulation clinic (Patient taking differently: Take 2.5-5 mg by mouth See admin instructions. 2.5mg  on Mondays and Fridays, 5mg  all other days) 30 tablet 3  . zolpidem (AMBIEN) 10 MG tablet TAKE 1 TABLET BY MOUTH AT BEDTIME 30 tablet 5  . metolazone (ZAROXOLYN) 2.5 MG tablet Take 1 tablet (2.5 mg total) by mouth daily. 10 tablet 0   No current facility-administered medications for this visit.     Allergies: Allergies  Allergen Reactions  . Amiodarone Other (See Comments)    hyperthyroidism  . Pravastatin Sodium Other (See Comments)    myalgia  . Simvastatin Other (See Comments)    severe leg cramps  . Statins Other (See Comments)    myalgia  . Tape Other (See Comments)    Skin Tears Use Paper Tape Only    Social History: The patient  reports that he quit smoking about 9 years ago. His smoking use included Cigarettes. He has a 60.00 pack-year smoking history. He has never used smokeless tobacco. He reports that he does not drink alcohol or use drugs.   Family History: The patient's family history includes Allergies in his mother; Asthma in his maternal grandmother and mother; COPD in his mother; Colon polyps in his mother; Hypothyroidism in his mother.   Review of Systems: Please see the history of present illness.   Otherwise, the review of systems is positive for none.   All other systems are reviewed and negative.   Physical Exam: VS:  BP 98/60   Pulse 82   Ht 5\' 10"  (1.778 m)   Wt (!) 301 lb 12.8 oz (136.9 kg)   SpO2 95%  Comment: at rest  BMI 43.30 kg/m  .  BMI Body mass  index is 43.3 kg/m.  Wt Readings from Last 3 Encounters:  01/15/17 (!) 301 lb 12.8 oz (136.9 kg)  01/14/17 293 lb (132.9 kg)  01/10/17 290 lb 3.2 oz (131.6 kg)    General: Pleasant. He looks pretty puny. He is alert and in no acute distress.  He remains morbidly obese.  HEENT: Normal. JVP elevated.  Neck: Supple, + JVD, no carotid bruits or masses noted.  Cardiac: Irregular irregular rhythm. Rate is ok. Heart tones distant. +2 edema. Legs with brawny stasis changes.  Respiratory:  Lungs are fairly clear to auscultation bilaterally with normal work of breathing.  GI: Soft and nontender.  MS: No deformity or atrophy. Gait and ROM intact.  Skin: Warm and dry. Color is sallow Neuro:  Strength and sensation are intact and no gross focal deficits noted.  Psych: Alert, appropriate and with normal affect.   LABORATORY DATA:  EKG:  EKG is not ordered today.  Lab Results  Component Value Date   WBC 8.8 01/07/2017   HGB 13.9 01/07/2017   HCT 41.1 01/07/2017   PLT 234 01/07/2017   GLUCOSE 99 01/08/2017   CHOL 164 12/04/2016   TRIG 134.0 12/04/2016   HDL 35.20 (L) 12/04/2016   LDLDIRECT 111.0 05/30/2016   LDLCALC 102 (H) 12/04/2016   ALT 19 12/04/2016   AST 21 12/04/2016   NA 132 (L) 01/08/2017   K 3.7 01/08/2017   CL 85 (L) 01/08/2017   CREATININE 1.52 (H) 01/08/2017   BUN 32 (H) 01/08/2017   CO2 37 (H) 01/08/2017   TSH 1.48 11/29/2016   PSA 2.59 05/30/2016   INR 1.33 01/08/2017   HGBA1C 6.0 12/04/2016   MICROALBUR 1.2 05/30/2016    BNP (last 3 results)  Recent Labs  12/31/16 1136  BNP 395.2*    ProBNP (last 3 results) No results for input(s): PROBNP in the last 8760 hours.   Other Studies Reviewed Today:  Echo Study Conclusions from 12/2016  - Left ventricle: The cavity size was severely dilated. There was   mild concentric hypertrophy. Systolic function was severely   reduced. The estimated ejection  fraction was in the range of   10-15%. Diffuse hypokinesis. The study was not technically   sufficient to allow evaluation of LV diastolic dysfunction due to   atrial fibrillation. - Ventricular septum: Septal motion showed paradox. - Aortic valve: There was trivial regurgitation. - Mitral valve: There was mild regurgitation. - Left atrium: The atrium was severely dilated. - Right ventricle: The cavity size was moderately dilated. Wall   thickness was normal. Systolic function was severely reduced. - Right atrium: The atrium was severely dilated. - Tricuspid valve: There was moderate regurgitation. - Pulmonary arteries: Systolic pressure was moderately increased.   PA peak pressure: 45 mm Hg (S). - Impressions: Compared to the prior study on 08/29/2015 LVEF has   decreased from 30-35% to 10-15%.   RVEF is also severely decreased.  Impressions:  - Compared to the prior study on 08/29/2015 LVEF has decreased from   30-35% to 10-15%.   RVEF is also severely decreased.   -Compared to the prior study on 08/29/2015 LVEF has decreased from 30-35% to 10-15%. RVEF is also severely decreased.    Procedures   Right/Left Heart Cath and Coronary Angiography 03/2016  Conclusion   1. Mild CAD (80% stenosis in ostial small D1, too small for intervention and did not cause cardiomyopathy).  2. Optimized filling pressures.  Relatively preserved cardiac output.  3. EF about 30% with  diffuse hypokinesis.   Nonischemic cardiomyopathy.  May resume coumadin tonight.  Needs medical management of cardiomyopathy, per Dr [name].     Echo Study Conclusions from 2016  - Procedure narrative: Transthoracic echocardiography. Image   quality was suboptimal. The study was technically difficult.   Intravenous contrast (Definity) was administered. - Left ventricle: The cavity size was mildly dilated. Wall   thickness was normal. Systolic function was moderately to   severely reduced. The  estimated ejection fraction was in the   range of 30% to 35%. Moderate diffuse hypokinesis with no   identifiable regional variations. Acoustic contrast opacification   revealed no evidence ofthrombus. - Left atrium: The atrium was severely dilated. - Right ventricle: The cavity size was moderately dilated. Systolic   function was mildly reduced. - Right atrium: The atrium was severely dilated. Notes Recorded by Deboraha Sprang, MD on 08/30/2015 at 9:34 AM Please Inform Patient Echo showedsomewhat improved LV function; We will have to discuss next steps incl possible AV ablation and pacing for control of rate and presumed the cause of LV dysfunction    Assessment/Plan:  Acute on chronic hypoxemic respiratory failure: Multifactorial, associated with CAP, CHF exacerbation superimposed on COPD. Patient uses nocturnal O2 at home intermittently. He notes no more coughing.   CAP: RLL with associated small effusion. Strep pneumo Ag negative, HIV NR. No leukocytosis or fever. Finished abx 3/25. Blood cultures negative.  - Recommended to have follow up CXR as outpatient to ensure resolution of infiltrate and effusion. Will arrange on follow up if not done by PCP.    Acute on chronic CHF:  Update echocardiogram showed interval worsening. Compared to the prior study on 08/29/2015 LVEF has decreased from30-35%down to 10-15%.RVEF is also now severely decreased. Volume overload due to patient cutting back his torsemide dose to 60mg  daily (instead of BID) at home due to frequent urination. EDW initially thought to be ~302lbs. 310lbs at admission. BNP 395. Tn neg. EDW at discharge is 293lbs.   His weight is already going back up - will give him 3 days of Zaroxolyn with careful instructions whether he will need the 3rd dose of not. Stat labs today. Referral to CHF clinic - his overall prognosis looks quite poor to me. Would try to add aldactone once kidney function levels out. Need to try and consider  Entresto as well. Plan was to repeat echo 3 months after medication optimization and AICD placement if EF remains <35%.  Also per Dr. Sallyanne Kuster - may need to consider AV node ablation and CRT implant. He has discussed CRT with Dr. [name] in the past - I think it was felt he might not respond due to absence of typical LBBB morphology.   Afib: Managed with rate control and is on anticoagulation with Coumadin.  His nebivolol was changed to coreg per cardiology during this recent admission and he is no longer on diltiazem due to low EF. HR is stable today. He has had a tendency towards bradycardia.   Hypertension: BP now soft - explained how we want his BP in this range given the weakness of his heart muscle - he is not overly symptomatic. No changes with other medicines today.   Current medicines are reviewed with the patient today.  The patient does not have concerns regarding medicines other than what has been noted above.  The following changes have been made:  See above.  Labs/ tests ordered today include:    Orders Placed This Encounter  Procedures  .  Basic metabolic panel  . CBC  . Pro b natriuretic peptide (BNP)  . Ambulatory referral to Cardiology     Disposition:   Very tenuous situation. Overall prognosis quite poor to me. Will ask Dr. Haroldine Laws to see if he has other options. Doubt he is a candidate now for advanced interventions given that the right heart is failing. I will see back next week. Will also discuss with Dr. [name].    Patient is agreeable to this plan and will call if any problems develop in the interim.   SignedTruitt Merle, NP  01/15/2017 9:25 AM  [facility] [address] [place], [place]  [address] Phone: [phone] Fax: [phone]

## 2017-01-14 NOTE — Patient Outreach (Signed)
[facility]) Care Management   01/14/2017  [name] [birth year] [id number]  [name] is an 61 y.o. male Arrived for 10 am home visit. Patient able to walk to door without problems.  Subjective: Patient reports weight is slightly up today to 293.  Which is new estimated dry weight. Reports 8 liters of fluid removed. States that he is following fluid of 1500 cc per day.  Reports today is the best he has felt since he got home from hospital.  Reports that he is monitoring his weight daily and following a low salt diet. Reports taking all medications as prescribed.   Reports follow up appointments made with cardiology, primary MD, and urology.   Currently uses oxygen at 2.5 liters at night and as needed during the day.  Patient voices his main concern is his skin with itching and lesions that pop out.  Reports itching drives him crazy.  Objective:  Awake and alert. Shortness of breath noted during ambulation.  Up to the bathroom 4 times during home visit.  Ambulates well.  Scratching skin during home visit.  Vitals:   01/14/17 1024  BP: 106/60  Pulse: 82  Resp: 20  SpO2: 98%  Weight: 293 lb (132.9 kg)  Height: 1.778 m (5\' 10" )   Review of Systems  Constitutional: Positive for malaise/fatigue.  HENT: Negative.   Eyes:       Reports poor vision  Respiratory: Positive for shortness of breath.        Reports shortness of breath with exercise. Wears oxygen at night  Cardiovascular:       Reports  Poor vision and states needs to make an eye appointment.  Gastrointestinal: Positive for constipation.  Genitourinary: Positive for frequency.  Musculoskeletal: Positive for joint pain.  Skin: Positive for itching.  Neurological: Negative.   Endo/Heme/Allergies: Bruises/bleeds easily.  Psychiatric/Behavioral: Positive for depression.    Physical Exam  Constitutional: He is oriented to person, place, and time. He appears well-developed and well-nourished.   Cardiovascular: Intact distal pulses.   Irregular heart beat  Respiratory: Effort normal and breath sounds normal.  Lungs clear  GI: Soft. Bowel sounds are normal.  Musculoskeletal: Normal range of motion. He exhibits tenderness. He exhibits no edema.  Neurological: He is alert and oriented to person, place, and time.  Skin: Skin is warm and dry.  Left shin with scabbed area. No redness.   Psychiatric: He has a normal mood and affect. His behavior is normal. Judgment and thought content normal.    Encounter Medications:   Outpatient Encounter Prescriptions as of 01/14/2017  Medication Sig  . ADVAIR DISKUS 250-50 MCG/DOSE AEPB INHALE 1 PUFF EVERY DAY AS DIRECTED  . albuterol (PROVENTIL HFA;VENTOLIN HFA) 108 (90 BASE) MCG/ACT inhaler Inhale 2 puffs into the lungs 4 (four) times daily. Also may Inhale 2 puffs into the lungs 4 times a day as needed for wheezing  . ALPRAZolam (XANAX) 0.5 MG tablet 1-2 tab by mouth at bedtime as needed for sleep  . carisoprodol (SOMA) 350 MG tablet TAKE 1 TABLET BY MOUTH 3 TIMES A DAY AS NEEDED FOR MUSCLE SPASMS  . carvedilol (COREG) 12.5 MG tablet Take 1 tablet (12.5 mg total) by mouth 2 (two) times daily with a meal.  . hydrocortisone 2.5 % cream Apply 1 application topically 2 (two) times daily. itching  . KLOR-CON M20 20 MEQ tablet TAKE 2 TABLETS BY MOUTH EVERY DAY  . levothyroxine (SYNTHROID, LEVOTHROID) 25 MCG tablet TAKE 1 TABLET BY  MOUTH EVERY DAY BEFORE BREAKFAST  . lidocaine (LIDODERM) 5 % Apply 1 patch every 12 hrs to affected area. Do not use more than one patch in 24 hrs.  . Linaclotide (LINZESS) 145 MCG CAPS capsule Take 145 mcg by mouth daily as needed (Crampy pain).   [name] omeprazole (PRILOSEC) 20 MG capsule Take 1 capsule (20 mg total) by mouth daily with supper.  . potassium chloride SA (K-DUR,KLOR-CON) 20 MEQ tablet Take 40 mEq by mouth daily.  . sertraline (ZOLOFT) 100 MG tablet TAKE 2 TABLETS BY MOUTH AT BEDTIME  . silver sulfADIAZINE  (SILVADENE) 1 % cream APPLY TO AFFECTED AREA TOPICALLY EVERY DAY  . tamsulosin (FLOMAX) 0.4 MG CAPS capsule Take 2 capsules (0.8 mg total) by mouth daily after breakfast.  . torsemide (DEMADEX) 20 MG tablet Take 3 tablets (60 mg total) by mouth 2 (two) times daily.  . TUMS 500 MG chewable tablet Chew 500 mg by mouth daily as needed.  . warfarin (COUMADIN) 5 MG tablet Take as directed by anticoagulation clinic (Patient taking differently: Take 2.5-5 mg by mouth See admin instructions. 2.5mg  on Mondays and Fridays, 5mg  all other days)  . zolpidem (AMBIEN) 10 MG tablet TAKE 1 TABLET BY MOUTH AT BEDTIME  . diphenhydrAMINE (BENADRYL) 25 MG tablet Take 25 mg by mouth every 6 (six) hours as needed for itching.  [name] doxepin (SINEQUAN) 25 MG capsule TAKE ONE CAPSULE BY MOUTH TWICE A DAY (Patient not taking: Reported on 01/11/2017)  . fluocinonide cream (LIDEX) 1.61 % Apply 1 application topically 2 (two) times daily. (Patient not taking: Reported on 01/14/2017)  . hydrOXYzine (ATARAX/VISTARIL) 25 MG tablet Take 25 mg by mouth daily as needed for itching.  . traZODone (DESYREL) 100 MG tablet TAKE 1/2 TABLET BY MOUTH AT BEDTIME AS NEEDED (Patient not taking: Reported on 01/11/2017)   No facility-administered encounter medications on file as of 01/14/2017.     Functional Status:   In your present state of health, do you have any difficulty performing the following activities: 01/14/2017 01/02/2017  Hearing? N N  Vision? Y N  Difficulty concentrating or making decisions? Y N  Walking or climbing stairs? Y Y  Dressing or bathing? N N  Doing errands, shopping? [name]  Preparing Food and eating ? N -  Using the Toilet? N -  In the past six months, have you accidently leaked urine? N -  Do you have problems with loss of bowel control? N -  Managing your Medications? N -  Managing your Finances? N -  Housekeeping or managing your Housekeeping? N -  Some recent data might be hidden    Fall/Depression Screening:     PHQ 2/9 Scores 01/14/2017 05/30/2016 11/15/2015 04/25/2015 04/11/2015 02/23/2015 02/16/2014  PHQ - 2 Score 2 1 2  0 0 0 1  PHQ- 9 Score 12 - 13 - - - -  Exception Documentation - - - Patient refusal - - -   Fall Risk  01/14/2017 05/30/2016 11/15/2015 05/10/2015 04/25/2015  Falls in the past year? No Yes Yes (No Data) Yes  Number falls in past yr: - 1 2 or more - 2 or more  Injury with Fall? - No No - No  Risk Factor Category  - - High Fall Risk - High Fall Risk  Risk for fall due to : - - History of fall(s) - History of fall(s)  Follow up - - Falls prevention discussed - Education provided   Assessment:   (1) Reviewed consent form from  2016.  Patient declines any needed changes. Has THN packet from last year and declines need for new packet. Magnet on the refrigerator.  Provided [facility] calendar and my card. (2) Recent admission to hospital for CHF. Reviewed CHF education. Heart failure zones, when to call MD. Importance of fluid restriction and low salt diet. Encouraged daily weights.  (3) positive depression screening (4) understand how to take medications as prescribed. (5) PATIENTS CHIEF CONCERN IS ITCHING SKIN.  (6) has advanced directives.    Plan:  (1) confirmed consent form in EMR. (2) Reviewed daily home CHF maintenance. (3) will send MD this note.  Patient feels depression is managed well at this time. (4) encouraged patient not to stop taking any of medications unless he talks with his MD.  (5) Will send this note to MD. Encouraged patient and daughter to voice concerns at next office visit. (6) encouraged patient if he makes any changes to advanced directives to notify MD.    Care planning and goal setting during home visit and primary goal is to avoid a readmission.  Patient will be outreached weekly via phone and encouraged patient to call sooner if needed. Provided card.  THN CM Care Plan Problem One     Most Recent Value  Care Plan Problem One  Recent admission for fluid overload.    Role Documenting the Problem One  Care Management [place] for Problem One  Active  THN Long Term Goal (31-90 days)  Patient will report no readmission in the next 60 days.   THN Long Term Goal Start Date  01/10/17  Interventions for Problem One Long Term Goal  Home visit completed. Assisted patient with list of questions to discuss with MD.   [facility] CM Short Term Goal #1 (0-30 days)  Patient will weigh daily and record for the next 30 days.  THN CM Short Term Goal #1 Start Date  01/10/17  Interventions for Short Term Goal #1  Reviewed importance of daily weights, CHF zones, When to call MD.  DIsucssed importance of trying to stay of estimated dry weight of 293.  reviewed fluid restriction.   THN CM Short Term Goal #2 (0-30 days)  Patient will attend follow up appointments with cardiology and primary MD in the next 10 days.   THN CM Short Term Goal #2 Start Date  01/10/17  Interventions for Short Term Goal #2  Encouraged patient to attend appointments.  He states that he will.       Tomasa Rand, RN, BSN, CEN [facility] [employer] [phone]

## 2017-01-15 ENCOUNTER — Telehealth: Payer: Self-pay | Admitting: Nurse Practitioner

## 2017-01-15 ENCOUNTER — Other Ambulatory Visit: Payer: Self-pay

## 2017-01-15 ENCOUNTER — Ambulatory Visit (INDEPENDENT_AMBULATORY_CARE_PROVIDER_SITE_OTHER): Payer: Medicare HMO | Admitting: Nurse Practitioner

## 2017-01-15 ENCOUNTER — Encounter: Payer: Self-pay | Admitting: Nurse Practitioner

## 2017-01-15 VITALS — BP 98/60 | HR 82 | Ht 70.0 in | Wt 301.8 lb

## 2017-01-15 DIAGNOSIS — I5022 Chronic systolic (congestive) heart failure: Secondary | ICD-10-CM | POA: Diagnosis not present

## 2017-01-15 DIAGNOSIS — I4819 Other persistent atrial fibrillation: Secondary | ICD-10-CM

## 2017-01-15 DIAGNOSIS — I481 Persistent atrial fibrillation: Secondary | ICD-10-CM

## 2017-01-15 LAB — CBC
Hematocrit: 38.4 % (ref 37.5–51.0)
Hemoglobin: 12.8 g/dL — ABNORMAL LOW (ref 13.0–17.7)
MCH: 28.7 pg (ref 26.6–33.0)
MCHC: 33.3 g/dL (ref 31.5–35.7)
MCV: 86 fL (ref 79–97)
Platelets: 221 10*3/uL (ref 150–379)
RBC: 4.46 x10E6/uL (ref 4.14–5.80)
RDW: 15.1 % (ref 12.3–15.4)
WBC: 7.6 10*3/uL (ref 3.4–10.8)

## 2017-01-15 LAB — BASIC METABOLIC PANEL
BUN/Creatinine Ratio: 25 — ABNORMAL HIGH (ref 10–24)
BUN: 25 mg/dL (ref 8–27)
CO2: 32 mmol/L — ABNORMAL HIGH (ref 18–29)
Calcium: 8.8 mg/dL (ref 8.6–10.2)
Chloride: 101 mmol/L (ref 96–106)
Creatinine, Ser: 1 mg/dL (ref 0.76–1.27)
GFR calc Af Amer: 93 mL/min/{1.73_m2} (ref >59)
GFR calc non Af Amer: 80 mL/min/{1.73_m2} (ref >59)
Glucose: 108 mg/dL — ABNORMAL HIGH (ref 65–99)
Potassium: 4 mmol/L (ref 3.5–5.2)
Sodium: 138 mmol/L (ref 134–144)

## 2017-01-15 LAB — PRO B NATRIURETIC PEPTIDE: NT-Pro BNP: 2062 pg/mL — ABNORMAL HIGH (ref 0–210)

## 2017-01-15 MED ORDER — METOLAZONE 2.5 MG PO TABS: 2.5000 mg | ORAL_TABLET | Freq: Every day | ORAL | 0 refills | Status: DC

## 2017-01-15 NOTE — Patient Instructions (Addendum)
We will be checking the following labs today - STAT BMET, CBC, BNP   Medication Instructions:    Continue with your current medicines. BUT  I am adding Zaroxolyn 2.5 mg to take today, tomorrow morning (about 30 minutes prior to your dose of Torsemide) and again on Thursday. If you have lost 5 pounds or more by Thursday - DO NOT TAKE ON Thursday. This is at your drug store.   Take one extra potassium for the next 3 days as well.     Testing/Procedures To Be Arranged:  N/A  Follow-Up:   See me back next week. Will plan to recheck your labs next week.    I have sent in a referral to go to the CHF clinic.     Other Special Instructions:   Continue to weigh daily  Keep avoiding salt.     If you need a refill on your cardiac medications before your next appointment, please call your pharmacy.   Call the [place] office at [phone] if you have any questions, problems or concerns.

## 2017-01-15 NOTE — Telephone Encounter (Signed)
[facility] calling pt's stat [id number]. Will send to Cecille Rubin to advise.

## 2017-01-15 NOTE — Telephone Encounter (Signed)
New Message  Joy from [place] calling to provide pts results.  Please f/u

## 2017-01-15 NOTE — Patient Outreach (Signed)
Telephone assessment: Incoming call from patient after his office visit with cardiology. Patient reports that weight is up again today. Today is 301.  MD ordered metolazone for 3 days and if weight is not down to call back.   PLAN: offered support via phone and encouraged patient to take medications as prescribed. Will continue to follow up with patient.   Tomasa Rand, RN, BSN, CEN [facility] [employer] [phone]

## 2017-01-16 ENCOUNTER — Other Ambulatory Visit: Payer: Self-pay

## 2017-01-16 NOTE — Patient Outreach (Signed)
Transition of care follow up: Vitals:   01/16/17 1032  Weight: 289 lb (131.1 kg)   Placed call to patient who reports that his weight was 302 pounds on his home scale yesterday. Reports that he took his metolazone yesterday. Reports that his weight today is 289.  Patient reports that he confirmed this by weighing 3 times.  States that he has already taken his metolazone today but states that he feels a little weak.  Encouraged patient to eat and drink well today. Encouraged patient to weigh in the morning and call MD before taking next dose of metolazone.  Patient voiced understanding.   PLAN: will follow up with patient next week as planned. Will send this note to cardiology.   Tomasa Rand, RN, BSN, CEN [facility] [employer] [phone]

## 2017-01-16 NOTE — Telephone Encounter (Signed)
Called pt to stop metolazone and extra dose of potassium. Pt will call back when message is received.

## 2017-01-16 NOTE — Telephone Encounter (Signed)
s/w pt, is down 14 lbs from yesterday due to starting metolazone, pt has already taken second dose today. Per Lori's recommendations pt is to stop taking metolazone and extra dose of potassium.  Is to eat a little salt today due to weakness and call tomorrow if pt drops more weight. Cecille Rubin is out of office tomorrow and will leave instructions for triage.  Will route to [place].

## 2017-01-16 NOTE — Telephone Encounter (Signed)
I have also held this afternoon's dose of Torsemide (for Wednesday only).   If his weight continues to drop when he weighs tomorrow - would hold both doses of the Torsemide for Thursday and restart on Friday.   No more Zaroxolyn.   He has labs and sees me next week.

## 2017-01-17 ENCOUNTER — Encounter: Payer: Self-pay | Admitting: Internal Medicine

## 2017-01-17 ENCOUNTER — Ambulatory Visit (INDEPENDENT_AMBULATORY_CARE_PROVIDER_SITE_OTHER)
Admission: RE | Admit: 2017-01-17 | Discharge: 2017-01-17 | Disposition: A | Discharge status: Home / Self Care | Payer: Medicare HMO | Source: Ambulatory Visit | Attending: Internal Medicine | Admitting: Internal Medicine

## 2017-01-17 ENCOUNTER — Ambulatory Visit (INDEPENDENT_AMBULATORY_CARE_PROVIDER_SITE_OTHER): Payer: Medicare HMO | Admitting: Internal Medicine

## 2017-01-17 ENCOUNTER — Ambulatory Visit (INDEPENDENT_AMBULATORY_CARE_PROVIDER_SITE_OTHER): Payer: Medicare HMO | Admitting: General Practice

## 2017-01-17 VITALS — BP 112/72 | HR 91 | Temp 98.4°F | Ht 70.0 in | Wt 290.0 lb

## 2017-01-17 DIAGNOSIS — I482 Chronic atrial fibrillation: Secondary | ICD-10-CM

## 2017-01-17 DIAGNOSIS — N401 Enlarged prostate with lower urinary tract symptoms: Secondary | ICD-10-CM | POA: Diagnosis not present

## 2017-01-17 DIAGNOSIS — L308 Other specified dermatitis: Secondary | ICD-10-CM | POA: Diagnosis not present

## 2017-01-17 DIAGNOSIS — J181 Lobar pneumonia, unspecified organism: Secondary | ICD-10-CM

## 2017-01-17 DIAGNOSIS — Z5181 Encounter for therapeutic drug level monitoring: Secondary | ICD-10-CM

## 2017-01-17 DIAGNOSIS — Z7901 Long term (current) use of anticoagulants: Secondary | ICD-10-CM | POA: Diagnosis not present

## 2017-01-17 DIAGNOSIS — J189 Pneumonia, unspecified organism: Secondary | ICD-10-CM | POA: Diagnosis not present

## 2017-01-17 DIAGNOSIS — I4821 Permanent atrial fibrillation: Secondary | ICD-10-CM

## 2017-01-17 LAB — POCT INR: INR: 1.6

## 2017-01-17 MED ORDER — METHYLPREDNISOLONE ACETATE 80 MG/ML IJ SUSP
80.0000 mg | Freq: Once | INTRAMUSCULAR | Status: AC
  Administered 2017-01-17: 80 mg via INTRAMUSCULAR

## 2017-01-17 MED ORDER — PREDNISONE 2 MG PO TBEC: DELAYED_RELEASE_TABLET | ORAL | 0 refills | Status: DC

## 2017-01-17 NOTE — Assessment & Plan Note (Signed)
Stable rate, improved volume with metolazone per cardiology, cont same tx

## 2017-01-17 NOTE — Patient Instructions (Signed)
Pre visit review using our clinic review tool, if applicable. No additional management support is needed unless otherwise documented below in the visit note. 

## 2017-01-17 NOTE — Patient Instructions (Signed)
Please take all new medication as prescribed  - the prednisone 2 mg  You had the steroid shot today  We will try to have you see [name] today for the INR  Please continue all other medications as before, and refills have been done if requested.  Please have the pharmacy call with any other refills you may need.  Please keep your appointments with your specialists as you may have planned  Please go to the XRAY Department in the Basement (go straight as you get off the elevator) for the x-ray testing  You will be contacted by phone if any changes need to be made immediately.  Otherwise, you will receive a letter about your results with an explanation, but please check with MyChart first.  Please remember to sign up for MyChart if you have not done so, as this will be important to you in the future with finding out test results, communicating by private email, and scheduling acute appointments online when needed.  Please return in 3 months, or sooner if needed

## 2017-01-17 NOTE — Assessment & Plan Note (Addendum)
clinically improved, for f/u cxr,  to f/u any worsening symptoms or concerns  Note:  Total time for pt hx, exam, review of record with pt in the room, determination of diagnoses and plan for further eval and tx is > 40 min, with over 50% spent in coordination and counseling of patient with respect to afib, chf, cap, chronic anticoagulation and LUTS.

## 2017-01-17 NOTE — Assessment & Plan Note (Addendum)
Improved, cont flomax for now, has urology f/u planned apr 11 - dr Redmond Pulling

## 2017-01-17 NOTE — Assessment & Plan Note (Signed)
[place] for depomedrol IM x 1 today 80 mg, and prednisone 2 mg po daily x 30 days

## 2017-01-17 NOTE — Assessment & Plan Note (Signed)
INR only 1.3 at d/c, will ask coumadin clinic to see pt today

## 2017-01-17 NOTE — Progress Notes (Signed)
Subjective:    Patient ID: [name], male    DOB: 09/03/1956, 61 y.o.   MRN: [id number]  HPI  Here to f/u recent hospn; with acute on chronic resp fialure, diast chf exac, CAP complicated by Afib with RVR.  Treated with appropriate antibiotics, IV lasix requiring metolazone augmentation for effective diuresis, transitioned to torsemide. Already seen in f/u 4/3 per cardiology with evidence for wt gain and 3 days further metolazone added with good results in past 2 days with wt decreased and edema slight improvement per pt. Wt down 11 lbs with extra 3 days metolazone.  Referered to CHF clinc and may need Dr Belva Chimes eval as well.  HR stable today on coreg only, dilt stopped due to lower RVEF.  Wt Readings from Last 3 Encounters:  01/17/17 290 lb (131.5 kg)  01/16/17 289 lb (131.1 kg)  01/15/17 (!) 301 lb (136.5 kg)  Pt denies chest pain, increased sob or doe, wheezing, orthopnea, PND, increased LE swelling, palpitations, dizziness or syncope.  Pt denies fever, wt loss, night sweats, loss of appetite, or other constitutional symptoms  Pt on new flomax - Denies urinary symptoms such as dysuria, frequency, urgency, flank pain, hematuria or n/v, fever, chills.  His major complaint is the interminable itching related to his chronic spongiotic dermatitis which never ends and is markedly quality of life inhibiting.  Has seen 3 dermatologists and 2 allergists without good results.  Only tx that seemed to work temporarily was depomedrol IM from this office.  Asks for further , and even low dose prednisone for 30 days for some type of relief. Past Medical History:  Diagnosis Date  . Anemia    supposed to be taking Vit B but doesn't  . ANXIETY    takes Xanax nightly  . Arthritis   . ASTHMA    Albuterol prn and Advair daily;also takes Prednisone daily  . Asthma   . Atrial fibrillation ([place]) 09/06/2015  . Cardiomyopathy [facility])    a. EF 25% TEE July 2013; b. EF normalized 2015;  c. 03/2015 Echo: EF  40-45%, difrf HK, PASP 38 mmHg, Mild MR, sev LAE/RAE.  [name] Chronic constipation    takes OTC stool softener  . COPD (chronic obstructive pulmonary disease) ([place])   . DEPRESSION    takes Zoloft and Doxepin daily  . Diverticulitis   . DYSKINESIA, ESOPHAGUS   . Essential hypertension       . FIBROMYALGIA   . GERD (gastroesophageal reflux disease)       . Glaucoma   . HYPERLIPIDEMIA    a. Intolerant to statins.  . INSOMNIA    takes Ambien nightly  . Myocardial infarction    a. 2012 Myoview notable for prior infarct;  b. 03/2015 Lexiscan CL: EF 37%, diff HK, small area of inferior infarct from apex to base-->Med Rx.  . Myocardial infarction   . O2 dependent    uses oxygen 2 and 1/2 liters per [place] at hs  . Paroxysmal atrial fibrillation ([facility])    a. CHA2DS2VASc = 3--> takes Coumadin;  b. 03/15/2015 Successful TEE/DCCV;  c. 03/2015 recurrent afib, Amio d/c'd in setting of hyperthyroidism.  . Peripheral neuropathy ([place])   . Pneumonia 12/2016  . Rash and other nonspecific skin eruption 04/12/2009   no cause found saw dermatologists x 2 and allergist  . SLEEP APNEA, OBSTRUCTIVE    a. doesn't use CPAP  . Syncope    a. 03/2015 s/p MDT LINQ.  [name] Type II diabetes mellitus (  [facility])        Past Surgical History:  Procedure Laterality Date  . ACNE CYST REMOVAL     2 on back   . CARDIAC CATHETERIZATION N/A 03/21/2016   Procedure: Right/Left Heart Cath and Coronary Angiography;  Surgeon: Larey Dresser, MD;  Location: [place] CV [facility];  Service: Cardiovascular;  Laterality: N/A;  . CARDIOVERSION  04/18/2012   Procedure: CARDIOVERSION;  Surgeon: Fay Records, MD;  Location: [place];  Service: Cardiovascular;  Laterality: N/A;  . CARDIOVERSION  04/25/2012   Procedure: CARDIOVERSION;  Surgeon: Thayer Headings, MD;  Location: [place];  Service: Cardiovascular;  Laterality: N/A;  . CARDIOVERSION  04/25/2012   Procedure: CARDIOVERSION;  Surgeon: Fay Records, MD;  Location: [place];  Service: Cardiovascular;   Laterality: N/A;  . CARDIOVERSION  05/09/2012   Procedure: CARDIOVERSION;  Surgeon: Sherren Mocha, MD;  Location: [place];  Service: Cardiovascular;  Laterality: N/A;  changed from crenshaw to cooper by trish/leone-endo  . CARDIOVERSION N/A 03/15/2015   Procedure: CARDIOVERSION;  Surgeon: Thayer Headings, MD;  Location: [facility] ENDOSCOPY;  Service: Cardiovascular;  Laterality: N/A;  . COLONOSCOPY    . COLONOSCOPY WITH PROPOFOL N/A 10/21/2014   Procedure: COLONOSCOPY WITH PROPOFOL;  Surgeon: Ladene Artist, MD;  Location: [facility] ENDOSCOPY;  Service: Endoscopy;  Laterality: N/A;  . EP IMPLANTABLE DEVICE N/A 04/06/2015   Procedure: Loop Recorder Insertion;  Surgeon: Evans Lance, MD;  Location: [place] [facility];  Service: Cardiovascular;  Laterality: N/A;  . ESOPHAGOGASTRODUODENOSCOPY    . TEE WITHOUT CARDIOVERSION  04/25/2012   Procedure: TRANSESOPHAGEAL ECHOCARDIOGRAM (TEE);  Surgeon: Thayer Headings, MD;  Location: [place];  Service: Cardiovascular;  Laterality: N/A;  . TEE WITHOUT CARDIOVERSION N/A 03/15/2015   Procedure: TRANSESOPHAGEAL ECHOCARDIOGRAM (TEE);  Surgeon: Thayer Headings, MD;  Location: [place];  Service: Cardiovascular;  Laterality: N/A;  . TONSILLECTOMY    . TOTAL KNEE ARTHROPLASTY Right 06/15/2014   Procedure: TOTAL KNEE ARTHROPLASTY;  Surgeon: Renette Butters, MD;  Location: [place];  Service: Orthopedics;  Laterality: Right;    reports that he quit smoking about 9 years ago. His smoking use included Cigarettes. He has a 60.00 pack-year smoking history. He has never used smokeless tobacco. He reports that he does not drink alcohol or use drugs. family history includes Allergies in his mother; Asthma in his maternal grandmother and mother; COPD in his mother; Colon polyps in his mother; Hypothyroidism in his mother. Allergies  Allergen Reactions  . Amiodarone Other (See Comments)    hyperthyroidism  . Pravastatin Sodium Other (See Comments)    myalgia  . Simvastatin Other (See  Comments)    severe leg cramps  . Statins Other (See Comments)    myalgia  . Tape Other (See Comments)    Skin Tears Use Paper Tape Only   Current Outpatient Prescriptions on File Prior to Visit  Medication Sig Dispense Refill  . ADVAIR DISKUS 250-50 MCG/DOSE AEPB INHALE 1 PUFF EVERY DAY AS DIRECTED 60 each 3  . albuterol (PROVENTIL HFA;VENTOLIN HFA) 108 (90 BASE) MCG/ACT inhaler Inhale 2 puffs into the lungs 4 (four) times daily. Also may Inhale 2 puffs into the lungs 4 times a day as needed for wheezing    . ALPRAZolam (XANAX) 0.5 MG tablet 1-2 tab by mouth at bedtime as needed for sleep 180 tablet 1  . carisoprodol (SOMA) 350 MG tablet TAKE 1 TABLET BY MOUTH 3 TIMES A DAY AS NEEDED FOR MUSCLE SPASMS 90  tablet 5  . carvedilol (COREG) 12.5 MG tablet Take 1 tablet (12.5 mg total) by mouth 2 (two) times daily with a meal. 60 tablet 0  . hydrocortisone 2.5 % cream Apply 1 application topically 2 (two) times daily. itching    . levothyroxine (SYNTHROID, LEVOTHROID) 25 MCG tablet TAKE 1 TABLET BY MOUTH EVERY DAY BEFORE BREAKFAST 90 tablet 2  . lidocaine (LIDODERM) 5 % Apply 1 patch every 12 hrs to affected area. Do not use more than one patch in 24 hrs. 100 patch 1  . Linaclotide (LINZESS) 145 MCG CAPS capsule Take 145 mcg by mouth daily as needed (Crampy pain).     [name] omeprazole (PRILOSEC) 20 MG capsule Take 1 capsule (20 mg total) by mouth daily with supper. 90 capsule 1  . potassium chloride SA (K-DUR,KLOR-CON) 20 MEQ tablet Take 40 mEq by mouth daily.    . sertraline (ZOLOFT) 100 MG tablet TAKE 2 TABLETS BY MOUTH AT BEDTIME 180 tablet 3  . silver sulfADIAZINE (SILVADENE) 1 % cream APPLY TO AFFECTED AREA TOPICALLY EVERY DAY 50 g 3  . tamsulosin (FLOMAX) 0.4 MG CAPS capsule Take 2 capsules (0.8 mg total) by mouth daily after breakfast. 60 capsule 0  . torsemide (DEMADEX) 20 MG tablet Take 3 tablets (60 mg total) by mouth 2 (two) times daily. 180 tablet 3  . TUMS 500 MG chewable tablet Chew 500  mg by mouth daily as needed.    . warfarin (COUMADIN) 5 MG tablet Take as directed by anticoagulation clinic (Patient taking differently: Take 2.5-5 mg by mouth See admin instructions. 2.5mg  on Mondays and Fridays, 5mg  all other days) 30 tablet 3  . zolpidem (AMBIEN) 10 MG tablet TAKE 1 TABLET BY MOUTH AT BEDTIME 30 tablet 5  . [DISCONTINUED] atorvastatin (LIPITOR) 20 MG tablet Take 1 tablet (20 mg total) by mouth daily. 30 tablet 11  . [DISCONTINUED] gabapentin (NEURONTIN) 300 MG capsule Take 1 capsule (300 mg total) by mouth 3 (three) times daily. 90 capsule 5   No current facility-administered medications on file prior to visit.    Review of Systems  Constitutional: Negative for other unusual diaphoresis or sweats HENT: Negative for ear discharge or swelling Eyes: Negative for other worsening visual disturbances Respiratory: Negative for stridor or other swelling  Gastrointestinal: Negative for worsening distension or other blood Genitourinary: Negative for retention or other urinary change Musculoskeletal: Negative for other MSK pain or swelling Skin: Negative for color change or other new lesions Neurological: Negative for worsening tremors and other numbness  Psychiatric/Behavioral: Negative for worsening agitation or other fatigue All other system neg per pt    Objective:   Physical Exam BP 112/72   Pulse 91   Temp 98.4 F (36.9 C) (Oral)   Ht 5\' 10"  (1.778 m)   Wt 290 lb (131.5 kg)   SpO2 97%   BMI 41.61 kg/m  VS noted,  Constitutional: Pt appears in NAD HENT: Head: NCAT.  Right Ear: External ear normal.  Left Ear: External ear normal.  Eyes: . Pupils are equal, round, and reactive to light. Conjunctivae and EOM are normal Nose: without d/c or deformity Neck: Neck supple. Gross normal ROM Cardiovascular: Normal rate and regular rhythm.   Pulmonary/Chest: Effort normal and breath sounds decreased without rales or wheezing.  Abd:  Soft, NT, ND, + BS, no  organomegaly Neurological: Pt is alert. At baseline orientation, motor grossly intact Skin: Skin is warm. + rash to extremities and torso, no other new lesions, 1-2+ bilat  LE edema Psychiatric: Pt behavior is normal without agitation     Assessment & Plan:

## 2017-01-17 NOTE — Progress Notes (Signed)
Pre visit review using our clinic review tool, if applicable. No additional management support is needed unless otherwise documented below in the visit note. 

## 2017-01-18 ENCOUNTER — Encounter: Payer: Self-pay | Admitting: Nurse Practitioner

## 2017-01-18 ENCOUNTER — Other Ambulatory Visit: Payer: Self-pay | Admitting: Internal Medicine

## 2017-01-18 ENCOUNTER — Telehealth: Payer: Self-pay | Admitting: Internal Medicine

## 2017-01-18 ENCOUNTER — Encounter: Payer: Self-pay | Admitting: Internal Medicine

## 2017-01-18 MED ORDER — PREDNISONE 5 MG PO TABS: 2.5000 mg | ORAL_TABLET | Freq: Every day | ORAL | 0 refills | Status: DC

## 2017-01-18 NOTE — Telephone Encounter (Signed)
His concern was forwarded to Dr. [name] this morning. He has not had a chance to respond yet. We will be in touch when we have an answer for patient.

## 2017-01-18 NOTE — Telephone Encounter (Signed)
There must be some confusion,  The shot was to be given at the [facility], and then prednisone for home.  I am at a loss to explain why he apparently things the depomedrol shot was to be given at the pharmacy for $2000.

## 2017-01-18 NOTE — Telephone Encounter (Signed)
Pt was here yesterday and was prescribed Rayos 2mg . He said that his insurance will not cover it and it will be over $2000. Is there something else that can be sent over? Please advise.

## 2017-01-18 NOTE — Progress Notes (Signed)
I have reviewed and agree with the plan. 

## 2017-01-18 NOTE — Telephone Encounter (Signed)
Pt called again and states methylPREDNISolone acetate (DEPO-MEDROL) injection 80 mg is $2000 and he cannot afford it. He would like something else sent in.

## 2017-01-18 NOTE — Telephone Encounter (Signed)
Received a note per pharmacy that prednisone 2 mg is not covered by insurance  This is NOT the depomedrol shot mentioned in the previous note  OK to change to prednisone 5 mg - 1/2 per day for total 30 day - done erx

## 2017-01-18 NOTE — Telephone Encounter (Signed)
See other note - ok to change the pred 2 mg to the prednisone 5 mg at 1/2 tab per day for 30 days

## 2017-01-21 ENCOUNTER — Ambulatory Visit (INDEPENDENT_AMBULATORY_CARE_PROVIDER_SITE_OTHER): Payer: Medicare HMO | Admitting: Nurse Practitioner

## 2017-01-21 ENCOUNTER — Encounter (INDEPENDENT_AMBULATORY_CARE_PROVIDER_SITE_OTHER): Payer: Self-pay

## 2017-01-21 ENCOUNTER — Other Ambulatory Visit: Payer: Self-pay

## 2017-01-21 VITALS — BP 90/60 | HR 48 | Ht 70.0 in | Wt 282.4 lb

## 2017-01-21 DIAGNOSIS — I428 Other cardiomyopathies: Secondary | ICD-10-CM

## 2017-01-21 DIAGNOSIS — I5022 Chronic systolic (congestive) heart failure: Secondary | ICD-10-CM

## 2017-01-21 DIAGNOSIS — I481 Persistent atrial fibrillation: Secondary | ICD-10-CM

## 2017-01-21 DIAGNOSIS — I4819 Other persistent atrial fibrillation: Secondary | ICD-10-CM

## 2017-01-21 NOTE — Progress Notes (Signed)
CARDIOLOGY OFFICE NOTE  Date:  01/21/2017    [name] Date of Birth: 1955/12/28 Medical Record #[id number]  PCP:  [name], MD  Cardiologist:  Ree Shay  Chief Complaint  Patient presents with  . Congestive Heart Failure    6 day check - seen for Dr. [name]    History of Present Illness: [name] is a 61 y.o. male who presents today for a follow up visit. This is a 6 day check. Seen for Dr. [name].   He has a history of diastolic CHF with last TEE in 2016 demonstrating EF 50-55%, asthma, COPD, chronic respiratory failure on intermittent use of nocturnal oxygen, atrial fibrillation s/p DCCV in 2016 - previously on amiodarone and now in permanent AF,on coumadin, s/p loop recorder for prior episode of syncope, HTN, HLD, BPH, and GERD. Nonobstructive CAD per cath from 2017 noted.   Last seen here back in September by Dr. [name].    He recently presented to the emergency department with complaints of progressive worsening of shortness of breath and weakness present for weeks worsening over previous 48 hours, having woken up feeling smothered. He called EMS, but refused transport due to cost, and later had cardiology remotely interrogate his loop. They found an accelerated heart rate 170 bpm and referred him to the ED for further evaluation. Upon arrival he was tachypneic with respirations of 22,heart rate was 145 and ECG showed atopy with RBBB and left fascicular block, and nonspecific ST- T wave abnormality.He was hypoxic, improved on 3 L O2. Blood work was unremarkable, CXR showed posterior right base infiltrate with small right pleural effusion suspicious for pneumonia superimposed on underlying pulmonary vascular congestion. The patient was admitted for acute on chronic respiratory failure secondary to CAP and decompensated CHF resulting in Afib w/ RVR. A full course of antibiotics was given with negative cultures and resolution of all symptoms of infection. Rate  was well-controlled. Lasix 80mg  IV BID was started with limited diuresis, so this was augmented. Updated echocardiogram showed interval worsening of ejection fraction.   I saw him last week - weight already climbing back up. Bloated and short of breath. Gave short course of Zaroxolyn - only had to take 2 doses - lost 14 pounds with the first dose. Had to then adjust his diuretics.   Comes in today. Here alone today. Saw his PCP on Thursday - told to take the Zaroxolyn every day. He has done this. He does not feel good - feels nauseated. Vision is bad. Little anxious. Some shortness of breath. Weight is down. Dizzy and weak. No chest pain. Trying to do better about the salt - but admits it is hard - likes salt on apples. Going to CHF clinic later this week - he has been over the AF clinic before.   Past Medical History:  Diagnosis Date  . Anemia    supposed to be taking Vit B but doesn't  . ANXIETY    takes Xanax nightly  . Arthritis   . ASTHMA    Albuterol prn and Advair daily;also takes Prednisone daily  . Asthma   . Atrial fibrillation ([place]) 09/06/2015  . Cardiomyopathy [facility])    a. EF 25% TEE July 2013; b. EF normalized 2015;  c. 03/2015 Echo: EF 40-45%, difrf HK, PASP 38 mmHg, Mild MR, sev LAE/RAE.  [name] Chronic constipation    takes OTC stool softener  . COPD (chronic obstructive pulmonary disease) ([place])   . DEPRESSION  takes Zoloft and Doxepin daily  . Diverticulitis   . DYSKINESIA, ESOPHAGUS   . Essential hypertension       . FIBROMYALGIA   . GERD (gastroesophageal reflux disease)       . Glaucoma   . HYPERLIPIDEMIA    a. Intolerant to statins.  . INSOMNIA    takes Ambien nightly  . Myocardial infarction    a. 2012 Myoview notable for prior infarct;  b. 03/2015 Lexiscan CL: EF 37%, diff HK, small area of inferior infarct from apex to base-->Med Rx.  . Myocardial infarction   . O2 dependent    uses oxygen 2 and 1/2 liters per [place] at hs  . Paroxysmal atrial fibrillation  (HCC)    a. CHA2DS2VASc = 3--> takes Coumadin;  b. 03/15/2015 Successful TEE/DCCV;  c. 03/2015 recurrent afib, Amio d/c'd in setting of hyperthyroidism.  . Peripheral neuropathy ([place])   . Pneumonia 12/2016  . Rash and other nonspecific skin eruption 04/12/2009   no cause found saw dermatologists x 2 and allergist  . SLEEP APNEA, OBSTRUCTIVE    a. doesn't use CPAP  . Syncope    a. 03/2015 s/p MDT LINQ.  [name] Type II diabetes mellitus ([place])         Past Surgical History:  Procedure Laterality Date  . ACNE CYST REMOVAL     2 on back   . CARDIAC CATHETERIZATION N/A 03/21/2016   Procedure: Right/Left Heart Cath and Coronary Angiography;  Surgeon: Larey Dresser, MD;  Location: [place] [facility];  Service: Cardiovascular;  Laterality: N/A;  . CARDIOVERSION  04/18/2012   Procedure: CARDIOVERSION;  Surgeon: Fay Records, MD;  Location: [place];  Service: Cardiovascular;  Laterality: N/A;  . CARDIOVERSION  04/25/2012   Procedure: CARDIOVERSION;  Surgeon: Thayer Headings, MD;  Location: [place];  Service: Cardiovascular;  Laterality: N/A;  . CARDIOVERSION  04/25/2012   Procedure: CARDIOVERSION;  Surgeon: Fay Records, MD;  Location: [place];  Service: Cardiovascular;  Laterality: N/A;  . CARDIOVERSION  05/09/2012   Procedure: CARDIOVERSION;  Surgeon: Sherren Mocha, MD;  Location: [place];  Service: Cardiovascular;  Laterality: N/A;  changed from crenshaw to cooper by trish/leone-endo  . CARDIOVERSION N/A 03/15/2015   Procedure: CARDIOVERSION;  Surgeon: Thayer Headings, MD;  Location: [facility] ENDOSCOPY;  Service: Cardiovascular;  Laterality: N/A;  . COLONOSCOPY    . COLONOSCOPY WITH PROPOFOL N/A 10/21/2014   Procedure: COLONOSCOPY WITH PROPOFOL;  Surgeon: Ladene Artist, MD;  Location: [facility] ENDOSCOPY;  Service: Endoscopy;  Laterality: N/A;  . EP IMPLANTABLE DEVICE N/A 04/06/2015   Procedure: Loop Recorder Insertion;  Surgeon: Evans Lance, MD;  Location: [place] CV [facility];  Service: Cardiovascular;  Laterality:  N/A;  . ESOPHAGOGASTRODUODENOSCOPY    . TEE WITHOUT CARDIOVERSION  04/25/2012   Procedure: TRANSESOPHAGEAL ECHOCARDIOGRAM (TEE);  Surgeon: Thayer Headings, MD;  Location: [place];  Service: Cardiovascular;  Laterality: N/A;  . TEE WITHOUT CARDIOVERSION N/A 03/15/2015   Procedure: TRANSESOPHAGEAL ECHOCARDIOGRAM (TEE);  Surgeon: Thayer Headings, MD;  Location: [place];  Service: Cardiovascular;  Laterality: N/A;  . TONSILLECTOMY    . TOTAL KNEE ARTHROPLASTY Right 06/15/2014   Procedure: TOTAL KNEE ARTHROPLASTY;  Surgeon: Renette Butters, MD;  Location: [place];  Service: Orthopedics;  Laterality: Right;     Medications: Current Outpatient Prescriptions  Medication Sig Dispense Refill  . ADVAIR DISKUS 250-50 MCG/DOSE AEPB INHALE 1 PUFF EVERY DAY AS DIRECTED 60 each 3  . albuterol (PROVENTIL HFA;VENTOLIN HFA)  108 (90 BASE) MCG/ACT inhaler Inhale 2 puffs into the lungs 4 (four) times daily. Also may Inhale 2 puffs into the lungs 4 times a day as needed for wheezing    . ALPRAZolam (XANAX) 0.5 MG tablet 1-2 tab by mouth at bedtime as needed for sleep 180 tablet 1  . carisoprodol (SOMA) 350 MG tablet TAKE 1 TABLET BY MOUTH 3 TIMES A DAY AS NEEDED FOR MUSCLE SPASMS 90 tablet 5  . carvedilol (COREG) 12.5 MG tablet Take 1 tablet (12.5 mg total) by mouth 2 (two) times daily with a meal. 60 tablet 0  . hydrocortisone 2.5 % cream Apply 1 application topically 2 (two) times daily. itching    . levothyroxine (SYNTHROID, LEVOTHROID) 25 MCG tablet TAKE 1 TABLET BY MOUTH EVERY DAY BEFORE BREAKFAST 90 tablet 2  . lidocaine (LIDODERM) 5 % Apply 1 patch every 12 hrs to affected area. Do not use more than one patch in 24 hrs. 100 patch 1  . Linaclotide (LINZESS) 145 MCG CAPS capsule Take 145 mcg by mouth daily as needed (Crampy pain).     [name] omeprazole (PRILOSEC) 20 MG capsule Take 1 capsule (20 mg total) by mouth daily with supper. 90 capsule 1  . potassium chloride SA (K-DUR,KLOR-CON) 20 MEQ tablet Take 40  mEq by mouth daily.    . predniSONE (DELTASONE) 5 MG tablet Take 0.5 tablets (2.5 mg total) by mouth daily with breakfast. 15 tablet 0  . sertraline (ZOLOFT) 100 MG tablet TAKE 2 TABLETS BY MOUTH AT BEDTIME 180 tablet 3  . silver sulfADIAZINE (SILVADENE) 1 % cream APPLY TO AFFECTED AREA TOPICALLY EVERY DAY 50 g 3  . tamsulosin (FLOMAX) 0.4 MG CAPS capsule Take 2 capsules (0.8 mg total) by mouth daily after breakfast. 60 capsule 0  . torsemide (DEMADEX) 20 MG tablet Take 3 tablets (60 mg total) by mouth 2 (two) times daily. 180 tablet 3  . TUMS 500 MG chewable tablet Chew 500 mg by mouth daily as needed.    . warfarin (COUMADIN) 5 MG tablet Take as directed by anticoagulation clinic (Patient taking differently: Take 2.5-5 mg by mouth See admin instructions. 2.5mg  on Mondays and Fridays, 5mg  all other days) 30 tablet 3  . zolpidem (AMBIEN) 10 MG tablet TAKE 1 TABLET BY MOUTH AT BEDTIME 30 tablet 5   No current facility-administered medications for this visit.     Allergies: Allergies  Allergen Reactions  . Amiodarone Other (See Comments)    hyperthyroidism  . Pravastatin Sodium Other (See Comments)    myalgia  . Simvastatin Other (See Comments)    severe leg cramps  . Statins Other (See Comments)    myalgia  . Tape Other (See Comments)    Skin Tears Use Paper Tape Only    Social History: The patient  reports that he quit smoking about 9 years ago. His smoking use included Cigarettes. He has a 60.00 pack-year smoking history. He has never used smokeless tobacco. He reports that he does not drink alcohol or use drugs.   Family History: The patient's family history includes Allergies in his mother; Asthma in his maternal grandmother and mother; COPD in his mother; Colon polyps in his mother; Hypothyroidism in his mother.   Review of Systems: Please see the history of present illness.   Otherwise, the review of systems is positive for none.   All other systems are reviewed and negative.     Physical Exam: VS:  BP 90/60   Pulse (!) 48  Ht 5\' 10"  (1.778 m)   Wt 282 lb 6.4 oz (128.1 kg)   BMI 40.52 kg/m  .  BMI Body mass index is 40.52 kg/m.  Wt Readings from Last 3 Encounters:  01/21/17 282 lb 6.4 oz (128.1 kg)  01/17/17 290 lb (131.5 kg)  01/16/17 289 lb (131.1 kg)    General: Pleasant. Obese male who is alert and in no acute distress.   HEENT: Normal.  Neck: Supple, no JVD, carotid bruits, or masses noted.  Cardiac: Irregular irregular rhythm. His rate is ok. Less edema.  Respiratory:  Lungs are clear to auscultation bilaterally with normal work of breathing.  GI: Soft and nontender. Seems less bloated.  MS: No deformity or atrophy. Gait and ROM intact. Moves slow.  Skin: Warm and dry. Color is normal.  Neuro:  Strength and sensation are intact and no gross focal deficits noted.  Psych: Alert, appropriate and with normal affect.   LABORATORY DATA:  EKG:  EKG is not ordered today.   Lab Results  Component Value Date   WBC 7.6 01/15/2017   HGB 13.9 01/07/2017   HCT 38.4 01/15/2017   PLT 221 01/15/2017   GLUCOSE 108 (H) 01/15/2017   CHOL 164 12/04/2016   TRIG 134.0 12/04/2016   HDL 35.20 (L) 12/04/2016   LDLDIRECT 111.0 05/30/2016   LDLCALC 102 (H) 12/04/2016   ALT 19 12/04/2016   AST 21 12/04/2016   NA 138 01/15/2017   K 4.0 01/15/2017   CL 101 01/15/2017   CREATININE 1.00 01/15/2017   BUN 25 01/15/2017   CO2 32 (H) 01/15/2017   TSH 1.48 11/29/2016   PSA 2.59 05/30/2016   INR 1.6 01/17/2017   HGBA1C 6.0 12/04/2016   MICROALBUR 1.2 05/30/2016   Lab Results  Component Value Date   INR 1.6 01/17/2017   INR 1.33 01/08/2017   INR 1.52 01/07/2017     BNP (last 3 results)  Recent Labs  12/31/16 1136  BNP 395.2*    ProBNP (last 3 results)  Recent Labs  01/15/17 6789  PROBNP 2,062*     Other Studies Reviewed Today:  Echo Study Conclusions from 12/2016  - Left ventricle: The cavity size was severely dilated. There  was mild concentric hypertrophy. Systolic function was severely reduced. The estimated ejection fraction was in the range of 10-15%. Diffuse hypokinesis. The study was not technically sufficient to allow evaluation of LV diastolic dysfunction due to atrial fibrillation. - Ventricular septum: Septal motion showed paradox. - Aortic valve: There was trivial regurgitation. - Mitral valve: There was mild regurgitation. - Left atrium: The atrium was severely dilated. - Right ventricle: The cavity size was moderately dilated. Wall thickness was normal. Systolic function was severely reduced. - Right atrium: The atrium was severely dilated. - Tricuspid valve: There was moderate regurgitation. - Pulmonary arteries: Systolic pressure was moderately increased. PA peak pressure: 45 mm Hg (S). - Impressions: Compared to the prior study on 08/29/2015 LVEF has decreased from 30-35% to 10-15%. RVEF is also severely decreased.  Impressions:  - Compared to the prior study on 08/29/2015 LVEF has decreased from 30-35% to 10-15%. RVEF is also severely decreased.   -Compared to the prior study on 08/29/2015 LVEF has decreased from 30-35% to 10-15%. RVEF is also severely decreased.    Procedures   Right/Left Heart Cath and Coronary Angiography 03/2016  Conclusion   1. Mild CAD (80% stenosis in ostial small D1, too small for intervention and did not cause cardiomyopathy).  2. Optimized filling  pressures. Relatively preserved cardiac output.  3. EF about 30% with diffuse hypokinesis.   Nonischemic cardiomyopathy. May resume coumadin tonight. Needs medical management of cardiomyopathy, per Dr [name].     Echo Study Conclusions from 2016  - Procedure narrative: Transthoracic echocardiography. Image quality was suboptimal. The study was technically difficult. Intravenous contrast (Definity) was administered. - Left ventricle: The cavity size was  mildly dilated. Wall thickness was normal. Systolic function was moderately to severely reduced. The estimated ejection fraction was in the range of 30% to 35%. Moderate diffuse hypokinesis with no identifiable regional variations. Acoustic contrast opacification revealed no evidence ofthrombus. - Left atrium: The atrium was severely dilated. - Right ventricle: The cavity size was moderately dilated. Systolic function was mildly reduced. - Right atrium: The atrium was severely dilated.  Notes Recorded by Deboraha Sprang, MD on 08/30/2015 at 9:34 AM Please Inform Patient Echo showedsomewhat improved LV function; We will have to discuss next steps incl possible AV ablation and pacing for control of rate and presumed the cause of LV dysfunction    Assessment/Plan:  Acute on chronic hypoxemic respiratory failure: Multifactorial, associated with CAP, CHF exacerbation superimposed on COPD. Patient uses nocturnal O2 at home intermittently. He notes no more coughing.   CAP: RLL with associated small effusion. Strep pneumo Ag negative, HIV NR. No leukocytosis or fever. Finished abx 3/25. Blood cultures negative.  - FU CXR showed near complete clearing of right base infiltrate and tiny right pleural effusion. He looks better clinically.   Acute on chronic CHF:  Update echocardiogram showed interval worsening. Compared to the prior study on 08/29/2015 LVEF has decreased from30-35% and now down to 10-15%.RVEF is also now severely decreased. Volume overload due to patient cutting back his torsemide dose to 60mg  daily (instead of BID) at home due to frequent urination. EDW initially thoughtto be ~302lbs. 310lbs at admission. BNP 395. Tn neg. EDW at discharge is 293lbs.   His weight was up at his last visit - I gave him a short course of Zaroxolyn - now he has been taking daily on the advise of PCP - would prefer he be on a better CHF regimen with aldactone/Entresto instead. He is  seeing CHF clinic later this week - his overall prognosis looks quite poor to me.  Plan was to repeat echo 3 months after medication optimization and AICD placement if EF remains <35%.  Also per Dr. Sallyanne Kuster - "may need to consider AV node ablation and CRT implant. He has discussed CRT with Dr. [name] in the past - I think it was felt he might not respond due to absence of typical LBBB morphology".   I will be happy to follow back after he is seen in CHF. He needs close following in my opinion.   Afib: Managed with rate control and is on anticoagulation with Coumadin.  His nebivolol was changed to coreg per cardiology during this recent admission and he is no longer on diltiazem due to low EF. HR is stable today. He has had a tendency towards bradycardia.   Hypertension: BP now soft - explained how we want his BP in this range given the weakness of his heart muscle - he is not overly symptomatic. No changes with other medicines today other than stopping Zaroxolyn.   Current medicines are reviewed with the patient today.  The patient does not have concerns regarding medicines other than what has been noted above.  The following changes have been made:  See above.  Labs/  tests ordered today include:    Orders Placed This Encounter  Procedures  . Basic metabolic panel  . Pro b natriuretic peptide (BNP)     Disposition:   FU with me in 2 weeks after CHF visit if needed. Will be happy to see back as well.    Patient is agreeable to this plan and will call if any problems develop in the interim.   SignedTruitt Merle, NP  01/21/2017 12:08 PM  [place] [address] [place], [place]  [address] Phone: [phone] Fax: [phone]

## 2017-01-21 NOTE — Patient Outreach (Signed)
Transition of care call: Placed call to patient at 10am. He was at MD office and states that he will call back. 1:30pm Incoming call back from patient who reports that he does not feel well. States he was at cardiology office for more than 2 hours due to low BP.  Reports he has had dry mouth, weakness, low BP and blurred vision.  Patient repots that his home weight was 277 and MD office weight was 282.  States that he was told to stop his metolazone but when patient went to see primary MD he was told to continue to take this medications.  Patient reports that cardiology today thinks he is feeling poorly due to low BP.  Patient states he was told do not take any more metolazone.  Patient reports that he was given a prescription for prednisone for his skin and he thinks his itching is decreased.    No new concerns today.  PLAN: will continue to follow patient weekly for transition of care.  Encouraged patient to call me sooner if needed.    Tomasa Rand, RN, BSN, CEN [facility] [employer] [phone]

## 2017-01-21 NOTE — Patient Instructions (Addendum)
We will be checking the following labs today - BMET and BNP   Medication Instructions:    Continue with your current medicines. BUT   NO MORE ZAROXOLYN    Testing/Procedures To Be Arranged:  N/A  Follow-Up:   See the CHF clinic later this week as planned  See me 2 weeks later.    Other Special Instructions:   N/A    If you need a refill on your cardiac medications before your next appointment, please call your pharmacy.   Call the [place] office at [phone] if you have any questions, problems or concerns.

## 2017-01-22 DIAGNOSIS — J449 Chronic obstructive pulmonary disease, unspecified: Secondary | ICD-10-CM | POA: Diagnosis not present

## 2017-01-22 LAB — BASIC METABOLIC PANEL
BUN/Creatinine Ratio: 47 — ABNORMAL HIGH (ref 10–24)
BUN: 66 mg/dL — ABNORMAL HIGH (ref 8–27)
CO2: 26 mmol/L (ref 18–29)
Calcium: 10.2 mg/dL (ref 8.6–10.2)
Chloride: 91 mmol/L — ABNORMAL LOW (ref 96–106)
Creatinine, Ser: 1.39 mg/dL — ABNORMAL HIGH (ref 0.76–1.27)
GFR calc Af Amer: 62 mL/min/{1.73_m2} (ref >59)
GFR calc non Af Amer: 54 mL/min/{1.73_m2} — ABNORMAL LOW (ref >59)
Glucose: 78 mg/dL (ref 65–99)
Potassium: 3.1 mmol/L — ABNORMAL LOW (ref 3.5–5.2)
Sodium: 140 mmol/L (ref 134–144)

## 2017-01-22 LAB — PRO B NATRIURETIC PEPTIDE: NT-Pro BNP: 904 pg/mL — ABNORMAL HIGH (ref 0–210)

## 2017-01-24 ENCOUNTER — Ambulatory Visit (HOSPITAL_COMMUNITY)
Admission: RE | Admit: 2017-01-24 | Discharge: 2017-01-24 | Disposition: A | Discharge status: Home / Self Care | Payer: Medicare HMO | Source: Ambulatory Visit | Attending: Cardiology | Admitting: Cardiology

## 2017-01-24 ENCOUNTER — Encounter (HOSPITAL_COMMUNITY): Payer: Self-pay

## 2017-01-24 ENCOUNTER — Other Ambulatory Visit (HOSPITAL_COMMUNITY): Payer: Self-pay

## 2017-01-24 VITALS — BP 126/88 | HR 66 | Ht 70.0 in | Wt 287.0 lb

## 2017-01-24 DIAGNOSIS — E785 Hyperlipidemia, unspecified: Secondary | ICD-10-CM | POA: Insufficient documentation

## 2017-01-24 DIAGNOSIS — I429 Cardiomyopathy, unspecified: Secondary | ICD-10-CM | POA: Diagnosis not present

## 2017-01-24 DIAGNOSIS — I2510 Atherosclerotic heart disease of native coronary artery without angina pectoris: Secondary | ICD-10-CM | POA: Diagnosis not present

## 2017-01-24 DIAGNOSIS — R0600 Dyspnea, unspecified: Secondary | ICD-10-CM

## 2017-01-24 DIAGNOSIS — I482 Chronic atrial fibrillation: Secondary | ICD-10-CM | POA: Diagnosis not present

## 2017-01-24 DIAGNOSIS — I110 Hypertensive heart disease with heart failure: Secondary | ICD-10-CM | POA: Diagnosis not present

## 2017-01-24 DIAGNOSIS — Z800 Family history of malignant neoplasm of digestive organs: Secondary | ICD-10-CM | POA: Insufficient documentation

## 2017-01-24 DIAGNOSIS — Z825 Family history of asthma and other chronic lower respiratory diseases: Secondary | ICD-10-CM | POA: Diagnosis not present

## 2017-01-24 DIAGNOSIS — I5022 Chronic systolic (congestive) heart failure: Secondary | ICD-10-CM | POA: Diagnosis not present

## 2017-01-24 DIAGNOSIS — K219 Gastro-esophageal reflux disease without esophagitis: Secondary | ICD-10-CM | POA: Diagnosis not present

## 2017-01-24 DIAGNOSIS — Z87891 Personal history of nicotine dependence: Secondary | ICD-10-CM | POA: Diagnosis not present

## 2017-01-24 DIAGNOSIS — J449 Chronic obstructive pulmonary disease, unspecified: Secondary | ICD-10-CM | POA: Insufficient documentation

## 2017-01-24 DIAGNOSIS — N179 Acute kidney failure, unspecified: Secondary | ICD-10-CM | POA: Diagnosis not present

## 2017-01-24 DIAGNOSIS — N400 Enlarged prostate without lower urinary tract symptoms: Secondary | ICD-10-CM | POA: Diagnosis not present

## 2017-01-24 DIAGNOSIS — Z7901 Long term (current) use of anticoagulants: Secondary | ICD-10-CM | POA: Insufficient documentation

## 2017-01-24 DIAGNOSIS — I5032 Chronic diastolic (congestive) heart failure: Secondary | ICD-10-CM

## 2017-01-24 DIAGNOSIS — Z8371 Family history of colonic polyps: Secondary | ICD-10-CM | POA: Diagnosis not present

## 2017-01-24 DIAGNOSIS — Z79899 Other long term (current) drug therapy: Secondary | ICD-10-CM | POA: Insufficient documentation

## 2017-01-24 DIAGNOSIS — Z9981 Dependence on supplemental oxygen: Secondary | ICD-10-CM | POA: Insufficient documentation

## 2017-01-24 LAB — BASIC METABOLIC PANEL
Anion gap: 9 (ref 5–15)
BUN: 37 mg/dL — ABNORMAL HIGH (ref 6–20)
CHLORIDE: 103 mmol/L (ref 101–111)
CO2: 27 mmol/L (ref 22–32)
CREATININE: 1.08 mg/dL (ref 0.61–1.24)
Calcium: 8.9 mg/dL (ref 8.9–10.3)
GFR calc non Af Amer: >60 mL/min (ref >60)
Glucose, Bld: 100 mg/dL — ABNORMAL HIGH (ref 65–99)
POTASSIUM: 3.7 mmol/L (ref 3.5–5.1)
SODIUM: 139 mmol/L (ref 135–145)

## 2017-01-24 LAB — CBC
HEMATOCRIT: 42.6 % (ref 39.0–52.0)
Hemoglobin: 14.1 g/dL (ref 13.0–17.0)
MCH: 29.7 pg (ref 26.0–34.0)
MCHC: 33.1 g/dL (ref 30.0–36.0)
MCV: 89.9 fL (ref 78.0–100.0)
PLATELETS: 216 10*3/uL (ref 150–400)
RBC: 4.74 MIL/uL (ref 4.22–5.81)
RDW: 14.8 % (ref 11.5–15.5)
WBC: 9.5 10*3/uL (ref 4.0–10.5)

## 2017-01-24 LAB — PROTIME-INR
INR: 2.39
Prothrombin Time: 26.5 seconds — ABNORMAL HIGH (ref 11.4–15.2)

## 2017-01-24 LAB — BRAIN NATRIURETIC PEPTIDE: B NATRIURETIC PEPTIDE 5: 182.9 pg/mL — AB (ref 0.0–100.0)

## 2017-01-24 MED ORDER — SPIRONOLACTONE 25 MG PO TABS: 12.5000 mg | ORAL_TABLET | Freq: Every day | ORAL | 3 refills | Status: DC

## 2017-01-24 NOTE — Patient Instructions (Addendum)
START Spironolactone 12.5 mg (1/2 tablet) once daily.  Routine lab work today. Will notify you of abnormal results, otherwise no news is good news!  You have been scheduled for a right and left heart catheterization Monday April 16th. See attached sheet for additional instructions  Follow up with Dr. [name] 2-4 weeks.  Do the following things EVERYDAY: 1) Weigh yourself in the morning before breakfast. Write it down and keep it in a log. 2) Take your medicines as prescribed 3) Eat low salt foods-Limit salt (sodium) to 2000 mg per day.  4) Stay as active as you can everyday 5) Limit all fluids for the day to less than 2 liters

## 2017-01-25 ENCOUNTER — Ambulatory Visit (INDEPENDENT_AMBULATORY_CARE_PROVIDER_SITE_OTHER): Payer: Medicare HMO | Admitting: *Deleted

## 2017-01-25 DIAGNOSIS — R55 Syncope and collapse: Secondary | ICD-10-CM | POA: Diagnosis not present

## 2017-01-25 MED ORDER — SPIRONOLACTONE 25 MG PO TABS: 12.5000 mg | ORAL_TABLET | Freq: Every day | ORAL | 3 refills | Status: DC

## 2017-01-25 MED ORDER — POTASSIUM CHLORIDE CRYS ER 20 MEQ PO TBCR: 40.0000 meq | EXTENDED_RELEASE_TABLET | Freq: Two times a day (BID) | ORAL | 6 refills | Status: DC

## 2017-01-26 NOTE — Progress Notes (Addendum)
PCP: Dr. [name] EP: [name] HF Cardiology: [name]  61 yo with history of COPD on home oxygen, permanent atrial fibrillation, and chronic systolic CHF was referred by Dr. [name] for evaluation of CHF.  Amiodarone and DCCV were tried for atrial fibrillation in the past without success.   Patient has been noted to have a low EF since 2013.  EF improved by to normal by 2015 but dropped to 30-35% by 11/16.  Cath in 6/17 showed some CAD but not enough to cause cardiomyopathy.  He was admitted in 3/18 with PNA, atrial fibrillation/RVR, and acute on chronic systolic CHF.  Since that hospitalization, he has been wearing oxygen at all times.  Echo in 3/18 showed fall in EF to 10-15%.   He has struggled with dyspnea recently.  He has been on torsemide 60 mg bid and was taking metolazone daily for a number of days, stopping this week due to rise in creatinine.  He is short of breath walking fast in his house (on oxygen).  Without oxygen, he is short of breath with short distance walking.  He is short of breath walking up stairs.  He has chronic orthopnea, has slept in recliner x years.  No PND.  Occasional transient atypical chest pain.  No lightheadedness, syncope, or falls.  No BRBPR or melena.   He is on a low dose of prednisone chronically.  Says he was put on this for pruritis.   ECG (3/18, personally reviewed): atrial fibrillation, RBBB with QRS 194 msec  Labs (4/18): pro-BNP 904, K 3.1, creatinine 1.39 with BUN 66.   PMH: 1. Asthma 2. COPD: On home oxygen. Prior smoker.  3. Atrial fibrillation: Permanent.  He failed DCCV and amiodarone in the past . 4. HTN 5. GERD 6. Hyperlipidemia 7. BPH.  8. Chronic systolic CHF: Nonischemic cardiomyopathy.  - EF 25% by TEE in 7/13.  Normalized on 2015 echo. - Echo (11/16): EF 30-35%.  - LHC/RHC (6/17): 80% ostial stenosis small D1, 30-40% pLAD.  Mean RA 4, PA 26/10, mean PCWP 11, CI 2.33.  - Echo (3/18): EF 10-15%, diffuse hypokinesis, severe LV dilation,  moderate RV dilation with severely decreased systolic function.  9. Morbid obesity 10. ILR in place.   Social History   Social History  . Marital status: Divorced    Spouse name: N/A  . Number of children: 2  . Years of education: N/A   Occupational History  . retired/disabled. prev worked in Therapist, sports. Disabled   Social History Main Topics  . Smoking status: Former Smoker    Packs/day: 2.00    Years: 30.00    Types: Cigarettes    Quit date: 10/16/2007  . Smokeless tobacco: Never Used  . Alcohol use No  . Drug use: No  . Sexual activity: Not Currently   Other Topics Concern  . Not on file   Social History Narrative   Lives alone.   Family History  Problem Relation Age of Onset  . COPD Mother   . Asthma Mother   . Colon polyps Mother   . Allergies Mother   . Hypothyroidism Mother   . Asthma Maternal Grandmother   . Colon cancer Neg Hx    ROS: All systems reviewed and negative except as per HPI.   Current Outpatient Prescriptions  Medication Sig Dispense Refill  . ADVAIR DISKUS 250-50 MCG/DOSE AEPB INHALE 1 PUFF EVERY DAY AS DIRECTED (Patient taking differently: INHALE 1 PUFF ONCE A DAY IF NEEDED SHORTNESS OF BREATH) 60 each  3  . albuterol (PROVENTIL HFA;VENTOLIN HFA) 108 (90 BASE) MCG/ACT inhaler Inhale 2 puffs into the lungs every 6 (six) hours as needed for wheezing or shortness of breath.     . ALPRAZolam (XANAX) 0.5 MG tablet 1-2 tab by mouth at bedtime as needed for sleep (Patient taking differently: Take 1 mg by mouth at bedtime. ) 180 tablet 1  . carisoprodol (SOMA) 350 MG tablet TAKE 1 TABLET BY MOUTH 3 TIMES A DAY AS NEEDED FOR MUSCLE SPASMS 90 tablet 5  . carvedilol (COREG) 12.5 MG tablet Take 1 tablet (12.5 mg total) by mouth 2 (two) times daily with a meal. 60 tablet 0  . hydrocortisone 2.5 % cream Apply 1 application topically at bedtime. itching     . levothyroxine (SYNTHROID, LEVOTHROID) 25 MCG tablet TAKE 1 TABLET BY MOUTH EVERY DAY BEFORE  BREAKFAST 90 tablet 2  . lidocaine (LIDODERM) 5 % Apply 1 patch every 12 hrs to affected area. Do not use more than one patch in 24 hrs. 100 patch 1  . Linaclotide (LINZESS) 145 MCG CAPS capsule Take 145 mcg by mouth every other day.     [name] omeprazole (PRILOSEC) 20 MG capsule Take 1 capsule (20 mg total) by mouth daily with supper. 90 capsule 1  . potassium chloride SA (K-DUR,KLOR-CON) 20 MEQ tablet Take 2 tablets (40 mEq total) by mouth 2 (two) times daily. (Patient taking differently: Take 20 mEq by mouth 2 (two) times daily. ) 120 tablet 6  . predniSONE (DELTASONE) 5 MG tablet Take 0.5 tablets (2.5 mg total) by mouth daily with breakfast. 15 tablet 0  . sertraline (ZOLOFT) 100 MG tablet TAKE 2 TABLETS BY MOUTH AT BEDTIME (Patient taking differently: TAKE 1 TABLETS BY MOUTH AT BEDTIME) 180 tablet 3  . silver sulfADIAZINE (SILVADENE) 1 % cream APPLY TO AFFECTED AREA TOPICALLY EVERY DAY 50 g 3  . tamsulosin (FLOMAX) 0.4 MG CAPS capsule Take 2 capsules (0.8 mg total) by mouth daily after breakfast. 60 capsule 0  . torsemide (DEMADEX) 20 MG tablet Take 3 tablets (60 mg total) by mouth 2 (two) times daily. 180 tablet 3  . TUMS 500 MG chewable tablet Chew 500-2,000 mg by mouth every 4 (four) hours as needed for indigestion or heartburn.     . warfarin (COUMADIN) 5 MG tablet Take as directed by anticoagulation clinic (Patient taking differently: Take 2.5-5 mg by mouth See admin instructions. 2.5mg  on Mondays and Fridays, 5mg  all other days) 30 tablet 3  . zolpidem (AMBIEN) 10 MG tablet TAKE 1 TABLET BY MOUTH AT BEDTIME 30 tablet 5  . spironolactone (ALDACTONE) 25 MG tablet Take 0.5 tablets (12.5 mg total) by mouth daily. 45 tablet 3   No current facility-administered medications for this encounter.    BP 126/88 (BP Location: Left Arm, Patient Position: Sitting, Cuff Size: Normal)   Pulse 66   Ht 5\' 10"  (1.778 m)   Wt 287 lb (130.2 kg)   SpO2 96%   BMI 41.18 kg/m  General: NAD, obese Neck: No JVD,  no thyromegaly or thyroid nodule.  Lungs: Clear to auscultation bilaterally with normal respiratory effort. CV: Nondisplaced PMI.  Heart irregular S1/S2, no S3/S4, no murmur.  Trace ankle edema.  No carotid bruit.  Normal pedal pulses.  Abdomen: Soft, nontender, no hepatosplenomegaly, no distention.  Skin: Intact without lesions or rashes.  Neurologic: Alert and oriented x 3.  Psych: Normal affect. Extremities: No clubbing or cyanosis.  HEENT: Normal.   Assessment/Plan:  1. Atrial fibrillation:  Permanent, good rate control.   - Continue Coreg and warfarin.  2. COPD: No longer smoking.  He is on home oxygen.   3. Chronic systolic CHF: Nonischemic cardiomyopathy based on 6/17 cath. However, he has had a recent significant fall in EF from 30-35% to 10-15% on last echo from 3/18.  He has become more symptomatic.  Currently, NYHA class IIIb symptoms.  However, he does not appear significantly volume overloaded on exam.  Metolazone recently stopped with rise in creatinine. It is possible that current dyspnea is due mainly to COPD.  - I think that repeat left and right heart catheterization would be helpful to clarify things => significant fall in EF, also volume status looks ok despite significant dyspnea (confirm filling pressures and cardiac output).  We discussed risks/benefits of procedure and he agrees.  Hold warfarin 4 days prior.   - Add spironolactone 12.5 daily.  BMET/BNP today and again in 1 week.  - Continue current torsemide but would leave off metolazone until RHC.   - Continue Coreg.   - Eventually, would like to add Entresto.   - He qualifies for an ICD at this point with long-term low EF.  Nonischemic, so not as marked a benefit but age < 21.  Probably would not benefit much from AV nodal ablation/BiV pacing with controlled rate and RBBB.  3. COPD: Continue home oxygen.  4. CAD: Present on 6/17 cath but not enough to explain cardiomyopathy.  No exertional chest pain.  - He is on  warfarin so no ASA.  - Should be on statin given CAD, will address at cath.  5. AKI: BUN/creatinine up in the setting of daily metolazone.  This has been stopped.  BMET today.

## 2017-01-26 NOTE — Addendum Note (Signed)
Encounter addended by: Larey Dresser, MD on: 01/26/2017 11:43 PM<BR>    Actions taken: Sign clinical note

## 2017-01-27 ENCOUNTER — Other Ambulatory Visit: Payer: Self-pay | Admitting: Internal Medicine

## 2017-01-27 DIAGNOSIS — I428 Other cardiomyopathies: Secondary | ICD-10-CM

## 2017-01-27 DIAGNOSIS — I4819 Other persistent atrial fibrillation: Secondary | ICD-10-CM

## 2017-01-27 DIAGNOSIS — I255 Ischemic cardiomyopathy: Secondary | ICD-10-CM

## 2017-01-28 ENCOUNTER — Ambulatory Visit: Payer: Medicare HMO

## 2017-01-28 ENCOUNTER — Encounter (HOSPITAL_COMMUNITY)
Admission: RE | Disposition: A | Discharge status: Home / Self Care | Payer: Self-pay | Source: Ambulatory Visit | Attending: Cardiology

## 2017-01-28 ENCOUNTER — Ambulatory Visit (HOSPITAL_COMMUNITY)
Admission: RE | Admit: 2017-01-28 | Discharge: 2017-01-28 | Disposition: A | Discharge status: Home / Self Care | Payer: Medicare HMO | Source: Ambulatory Visit | Attending: Cardiology | Admitting: Cardiology

## 2017-01-28 DIAGNOSIS — I2510 Atherosclerotic heart disease of native coronary artery without angina pectoris: Secondary | ICD-10-CM | POA: Diagnosis not present

## 2017-01-28 DIAGNOSIS — Z6841 Body Mass Index (BMI) 40.0 and over, adult: Secondary | ICD-10-CM | POA: Insufficient documentation

## 2017-01-28 DIAGNOSIS — I5022 Chronic systolic (congestive) heart failure: Secondary | ICD-10-CM | POA: Insufficient documentation

## 2017-01-28 DIAGNOSIS — Z87891 Personal history of nicotine dependence: Secondary | ICD-10-CM | POA: Insufficient documentation

## 2017-01-28 DIAGNOSIS — J449 Chronic obstructive pulmonary disease, unspecified: Secondary | ICD-10-CM | POA: Diagnosis not present

## 2017-01-28 DIAGNOSIS — K219 Gastro-esophageal reflux disease without esophagitis: Secondary | ICD-10-CM | POA: Diagnosis not present

## 2017-01-28 DIAGNOSIS — E785 Hyperlipidemia, unspecified: Secondary | ICD-10-CM | POA: Diagnosis not present

## 2017-01-28 DIAGNOSIS — I110 Hypertensive heart disease with heart failure: Secondary | ICD-10-CM | POA: Insufficient documentation

## 2017-01-28 DIAGNOSIS — N400 Enlarged prostate without lower urinary tract symptoms: Secondary | ICD-10-CM | POA: Diagnosis not present

## 2017-01-28 DIAGNOSIS — Z7901 Long term (current) use of anticoagulants: Secondary | ICD-10-CM | POA: Insufficient documentation

## 2017-01-28 DIAGNOSIS — Z9981 Dependence on supplemental oxygen: Secondary | ICD-10-CM | POA: Diagnosis not present

## 2017-01-28 DIAGNOSIS — Z7951 Long term (current) use of inhaled steroids: Secondary | ICD-10-CM | POA: Diagnosis not present

## 2017-01-28 DIAGNOSIS — I482 Chronic atrial fibrillation: Secondary | ICD-10-CM | POA: Diagnosis not present

## 2017-01-28 DIAGNOSIS — Z7952 Long term (current) use of systemic steroids: Secondary | ICD-10-CM | POA: Diagnosis not present

## 2017-01-28 DIAGNOSIS — N179 Acute kidney failure, unspecified: Secondary | ICD-10-CM | POA: Diagnosis not present

## 2017-01-28 DIAGNOSIS — I509 Heart failure, unspecified: Secondary | ICD-10-CM | POA: Diagnosis not present

## 2017-01-28 DIAGNOSIS — I428 Other cardiomyopathies: Secondary | ICD-10-CM | POA: Insufficient documentation

## 2017-01-28 DIAGNOSIS — R0600 Dyspnea, unspecified: Secondary | ICD-10-CM

## 2017-01-28 HISTORY — PX: RIGHT/LEFT HEART CATH AND CORONARY ANGIOGRAPHY: CATH118266

## 2017-01-28 LAB — POCT I-STAT 3, VENOUS BLOOD GAS (G3P V)
Acid-Base Excess: 2 mmol/L (ref 0.0–2.0)
Acid-Base Excess: 3 mmol/L — ABNORMAL HIGH (ref 0.0–2.0)
Bicarbonate: 27.9 mmol/L (ref 20.0–28.0)
Bicarbonate: 29.1 mmol/L — ABNORMAL HIGH (ref 20.0–28.0)
O2 Saturation: 70 %
O2 Saturation: 71 %
PCO2 VEN: 50.1 mmHg (ref 44.0–60.0)
PCO2 VEN: 51.6 mmHg (ref 44.0–60.0)
PH VEN: 7.354 (ref 7.250–7.430)
PO2 VEN: 39 mmHg (ref 32.0–45.0)
TCO2: 29 mmol/L (ref 0–100)
TCO2: 31 mmol/L (ref 0–100)
pH, Ven: 7.359 (ref 7.250–7.430)
pO2, Ven: 39 mmHg (ref 32.0–45.0)

## 2017-01-28 LAB — PROTIME-INR
INR: 1.27
Prothrombin Time: 16 seconds — ABNORMAL HIGH (ref 11.4–15.2)

## 2017-01-28 LAB — POCT ACTIVATED CLOTTING TIME: ACTIVATED CLOTTING TIME: 191 s

## 2017-01-28 LAB — GLUCOSE, CAPILLARY
GLUCOSE-CAPILLARY: 104 mg/dL — AB (ref 65–99)
Glucose-Capillary: 78 mg/dL (ref 65–99)

## 2017-01-28 SURGERY — RIGHT/LEFT HEART CATH AND CORONARY ANGIOGRAPHY
Anesthesia: LOCAL

## 2017-01-28 MED ORDER — MIDAZOLAM HCL 2 MG/2ML IJ SOLN
INTRAMUSCULAR | Status: AC
  Filled 2017-01-28: qty 2

## 2017-01-28 MED ORDER — ASPIRIN 81 MG PO CHEW
CHEWABLE_TABLET | ORAL | Status: AC
  Administered 2017-01-28: 81 mg via ORAL
  Filled 2017-01-28: qty 1

## 2017-01-28 MED ORDER — ONDANSETRON HCL 4 MG/2ML IJ SOLN: 4.0000 mg | Freq: Four times a day (QID) | INTRAMUSCULAR | Status: DC | PRN

## 2017-01-28 MED ORDER — ASPIRIN 81 MG PO CHEW
81.0000 mg | CHEWABLE_TABLET | Freq: Once | ORAL | Status: AC
  Administered 2017-01-28: 81 mg via ORAL

## 2017-01-28 MED ORDER — HEPARIN (PORCINE) IN NACL 2-0.9 UNIT/ML-% IJ SOLN
INTRAMUSCULAR | Status: DC | PRN
  Administered 2017-01-28: 1000 mL

## 2017-01-28 MED ORDER — LIDOCAINE HCL (PF) 1 % IJ SOLN
INTRAMUSCULAR | Status: DC | PRN
  Administered 2017-01-28: 2 mL via SUBCUTANEOUS
  Administered 2017-01-28: 1 mL via SUBCUTANEOUS

## 2017-01-28 MED ORDER — SODIUM CHLORIDE 0.9 % IV SOLN: INTRAVENOUS | Status: DC

## 2017-01-28 MED ORDER — SODIUM CHLORIDE 0.9% FLUSH: 3.0000 mL | Freq: Two times a day (BID) | INTRAVENOUS | Status: DC

## 2017-01-28 MED ORDER — HEPARIN (PORCINE) IN NACL 2-0.9 UNIT/ML-% IJ SOLN
INTRAMUSCULAR | Status: DC | PRN
  Administered 2017-01-28: 12:00:00 via INTRA_ARTERIAL

## 2017-01-28 MED ORDER — FENTANYL CITRATE (PF) 100 MCG/2ML IJ SOLN
INTRAMUSCULAR | Status: DC | PRN
  Administered 2017-01-28 (×2): 25 ug via INTRAVENOUS

## 2017-01-28 MED ORDER — IODIXANOL 320 MG/ML IV SOLN
INTRAVENOUS | Status: DC | PRN
  Administered 2017-01-28: 50 mL via INTRAVENOUS

## 2017-01-28 MED ORDER — SODIUM CHLORIDE 0.9 % IV SOLN
INTRAVENOUS | Status: DC
  Administered 2017-01-28: 10:00:00 via INTRAVENOUS

## 2017-01-28 MED ORDER — SODIUM CHLORIDE 0.9 % IV SOLN: 250.0000 mL | INTRAVENOUS | Status: DC | PRN

## 2017-01-28 MED ORDER — ACETAMINOPHEN 325 MG PO TABS: 650.0000 mg | ORAL_TABLET | ORAL | Status: DC | PRN

## 2017-01-28 MED ORDER — HEPARIN SODIUM (PORCINE) 1000 UNIT/ML IJ SOLN
INTRAMUSCULAR | Status: DC | PRN
  Administered 2017-01-28: 6000 [IU] via INTRAVENOUS

## 2017-01-28 MED ORDER — LIDOCAINE HCL (PF) 1 % IJ SOLN
INTRAMUSCULAR | Status: AC
  Filled 2017-01-28: qty 30

## 2017-01-28 MED ORDER — IOPAMIDOL (ISOVUE-370) INJECTION 76%
INTRAVENOUS | Status: AC
  Filled 2017-01-28: qty 100

## 2017-01-28 MED ORDER — HEPARIN (PORCINE) IN NACL 2-0.9 UNIT/ML-% IJ SOLN
INTRAMUSCULAR | Status: AC
  Filled 2017-01-28: qty 1000

## 2017-01-28 MED ORDER — SODIUM CHLORIDE 0.9% FLUSH: 3.0000 mL | INTRAVENOUS | Status: DC | PRN

## 2017-01-28 MED ORDER — HEPARIN SODIUM (PORCINE) 1000 UNIT/ML IJ SOLN
INTRAMUSCULAR | Status: AC
  Filled 2017-01-28: qty 1

## 2017-01-28 MED ORDER — FENTANYL CITRATE (PF) 100 MCG/2ML IJ SOLN
INTRAMUSCULAR | Status: AC
  Filled 2017-01-28: qty 2

## 2017-01-28 MED ORDER — MIDAZOLAM HCL 2 MG/2ML IJ SOLN
INTRAMUSCULAR | Status: DC | PRN
  Administered 2017-01-28 (×2): 1 mg via INTRAVENOUS

## 2017-01-28 MED ORDER — VERAPAMIL HCL 2.5 MG/ML IV SOLN
INTRAVENOUS | Status: AC
  Filled 2017-01-28: qty 2

## 2017-01-28 SURGICAL SUPPLY — 12 items
CATH 5FR JL3.5 JR4 ANG PIG MP (CATHETERS) ×1 IMPLANT
CATH BALLN WEDGE 5F 110CM (CATHETERS) ×1 IMPLANT
DEVICE RAD COMP TR BAND LRG (VASCULAR PRODUCTS) ×1 IMPLANT
GLIDESHEATH SLEND SS 6F .021 (SHEATH) ×1 IMPLANT
GUIDEWIRE INQWIRE 1.5J.035X260 (WIRE) IMPLANT
INQWIRE 1.5J .035X260CM (WIRE) ×2
KIT HEART LEFT (KITS) ×2 IMPLANT
PACK CARDIAC CATHETERIZATION (CUSTOM PROCEDURE TRAY) ×2 IMPLANT
SHEATH FAST CATH BRACH 5F 5CM (SHEATH) IMPLANT
SHEATH GLIDE SLENDER 4/5FR (SHEATH) ×1 IMPLANT
TRANSDUCER W/STOPCOCK (MISCELLANEOUS) ×2 IMPLANT
TUBING CIL FLEX 10 FLL-RA (TUBING) ×2 IMPLANT

## 2017-01-28 NOTE — H&P (View-Only) (Signed)
PCP: Dr. [name] EP: [name] HF Cardiology: [name]  61 yo with history of COPD on home oxygen, permanent atrial fibrillation, and chronic systolic CHF was referred by Dr. [name] for evaluation of CHF.  Amiodarone and DCCV were tried for atrial fibrillation in the past without success.   Patient has been noted to have a low EF since 2013.  EF improved by to normal by 2015 but dropped to 30-35% by 11/16.  Cath in 6/17 showed some CAD but not enough to cause cardiomyopathy.  He was admitted in 3/18 with PNA, atrial fibrillation/RVR, and acute on chronic systolic CHF.  Since that hospitalization, he has been wearing oxygen at all times.  Echo in 3/18 showed fall in EF to 10-15%.   He has struggled with dyspnea recently.  He has been on torsemide 60 mg bid and was taking metolazone daily for a number of days, stopping this week due to rise in creatinine.  He is short of breath walking fast in his house (on oxygen).  Without oxygen, he is short of breath with short distance walking.  He is short of breath walking up stairs.  He has chronic orthopnea, has slept in recliner x years.  No PND.  Occasional transient atypical chest pain.  No lightheadedness, syncope, or falls.  No BRBPR or melena.   He is on a low dose of prednisone chronically.  Says he was put on this for pruritis.   ECG (3/18, personally reviewed): atrial fibrillation, RBBB with QRS 194 msec  Labs (4/18): pro-BNP 904, K 3.1, creatinine 1.39 with BUN 66.   PMH: 1. Asthma 2. COPD: On home oxygen. Prior smoker.  3. Atrial fibrillation: Permanent.  He failed DCCV and amiodarone in the past . 4. HTN 5. GERD 6. Hyperlipidemia 7. BPH.  8. Chronic systolic CHF: Nonischemic cardiomyopathy.  - EF 25% by TEE in 7/13.  Normalized on 2015 echo. - Echo (11/16): EF 30-35%.  - LHC/RHC (6/17): 80% ostial stenosis small D1, 30-40% pLAD.  Mean RA 4, PA 26/10, mean PCWP 11, CI 2.33.  - Echo (3/18): EF 10-15%, diffuse hypokinesis, severe LV dilation,  moderate RV dilation with severely decreased systolic function.  9. Morbid obesity 10. ILR in place.   Social History   Social History  . Marital status: Divorced    Spouse name: N/A  . Number of children: 2  . Years of education: N/A   Occupational History  . retired/disabled. prev worked in Therapist, sports. Disabled   Social History Main Topics  . Smoking status: Former Smoker    Packs/day: 2.00    Years: 30.00    Types: Cigarettes    Quit date: 10/16/2007  . Smokeless tobacco: Never Used  . Alcohol use No  . Drug use: No  . Sexual activity: Not Currently   Other Topics Concern  . Not on file   Social History Narrative   Lives alone.   Family History  Problem Relation Age of Onset  . COPD Mother   . Asthma Mother   . Colon polyps Mother   . Allergies Mother   . Hypothyroidism Mother   . Asthma Maternal Grandmother   . Colon cancer Neg Hx    ROS: All systems reviewed and negative except as per HPI.   Current Outpatient Prescriptions  Medication Sig Dispense Refill  . ADVAIR DISKUS 250-50 MCG/DOSE AEPB INHALE 1 PUFF EVERY DAY AS DIRECTED (Patient taking differently: INHALE 1 PUFF ONCE A DAY IF NEEDED SHORTNESS OF BREATH) 60 each  3  . albuterol (PROVENTIL HFA;VENTOLIN HFA) 108 (90 BASE) MCG/ACT inhaler Inhale 2 puffs into the lungs every 6 (six) hours as needed for wheezing or shortness of breath.     . ALPRAZolam (XANAX) 0.5 MG tablet 1-2 tab by mouth at bedtime as needed for sleep (Patient taking differently: Take 1 mg by mouth at bedtime. ) 180 tablet 1  . carisoprodol (SOMA) 350 MG tablet TAKE 1 TABLET BY MOUTH 3 TIMES A DAY AS NEEDED FOR MUSCLE SPASMS 90 tablet 5  . carvedilol (COREG) 12.5 MG tablet Take 1 tablet (12.5 mg total) by mouth 2 (two) times daily with a meal. 60 tablet 0  . hydrocortisone 2.5 % cream Apply 1 application topically at bedtime. itching     . levothyroxine (SYNTHROID, LEVOTHROID) 25 MCG tablet TAKE 1 TABLET BY MOUTH EVERY DAY BEFORE  BREAKFAST 90 tablet 2  . lidocaine (LIDODERM) 5 % Apply 1 patch every 12 hrs to affected area. Do not use more than one patch in 24 hrs. 100 patch 1  . Linaclotide (LINZESS) 145 MCG CAPS capsule Take 145 mcg by mouth every other day.     [name] omeprazole (PRILOSEC) 20 MG capsule Take 1 capsule (20 mg total) by mouth daily with supper. 90 capsule 1  . potassium chloride SA (K-DUR,KLOR-CON) 20 MEQ tablet Take 2 tablets (40 mEq total) by mouth 2 (two) times daily. (Patient taking differently: Take 20 mEq by mouth 2 (two) times daily. ) 120 tablet 6  . predniSONE (DELTASONE) 5 MG tablet Take 0.5 tablets (2.5 mg total) by mouth daily with breakfast. 15 tablet 0  . sertraline (ZOLOFT) 100 MG tablet TAKE 2 TABLETS BY MOUTH AT BEDTIME (Patient taking differently: TAKE 1 TABLETS BY MOUTH AT BEDTIME) 180 tablet 3  . silver sulfADIAZINE (SILVADENE) 1 % cream APPLY TO AFFECTED AREA TOPICALLY EVERY DAY 50 g 3  . tamsulosin (FLOMAX) 0.4 MG CAPS capsule Take 2 capsules (0.8 mg total) by mouth daily after breakfast. 60 capsule 0  . torsemide (DEMADEX) 20 MG tablet Take 3 tablets (60 mg total) by mouth 2 (two) times daily. 180 tablet 3  . TUMS 500 MG chewable tablet Chew 500-2,000 mg by mouth every 4 (four) hours as needed for indigestion or heartburn.     . warfarin (COUMADIN) 5 MG tablet Take as directed by anticoagulation clinic (Patient taking differently: Take 2.5-5 mg by mouth See admin instructions. 2.5mg  on Mondays and Fridays, 5mg  all other days) 30 tablet 3  . zolpidem (AMBIEN) 10 MG tablet TAKE 1 TABLET BY MOUTH AT BEDTIME 30 tablet 5  . spironolactone (ALDACTONE) 25 MG tablet Take 0.5 tablets (12.5 mg total) by mouth daily. 45 tablet 3   No current facility-administered medications for this encounter.    BP 126/88 (BP Location: Left Arm, Patient Position: Sitting, Cuff Size: Normal)   Pulse 66   Ht 5\' 10"  (1.778 m)   Wt 287 lb (130.2 kg)   SpO2 96%   BMI 41.18 kg/m  General: NAD, obese Neck: No JVD,  no thyromegaly or thyroid nodule.  Lungs: Clear to auscultation bilaterally with normal respiratory effort. CV: Nondisplaced PMI.  Heart irregular S1/S2, no S3/S4, no murmur.  Trace ankle edema.  No carotid bruit.  Normal pedal pulses.  Abdomen: Soft, nontender, no hepatosplenomegaly, no distention.  Skin: Intact without lesions or rashes.  Neurologic: Alert and oriented x 3.  Psych: Normal affect. Extremities: No clubbing or cyanosis.  HEENT: Normal.   Assessment/Plan:  1. Atrial fibrillation:  Permanent, good rate control.   - Continue Coreg and warfarin.  2. COPD: No longer smoking.  He is on home oxygen.   3. Chronic systolic CHF: Nonischemic cardiomyopathy based on 6/17 cath. However, he has had a recent significant fall in EF from 30-35% to 10-15% on last echo from 3/18.  He has become more symptomatic.  Currently, NYHA class IIIb symptoms.  However, he does not appear significantly volume overloaded on exam.  Metolazone recently stopped with rise in creatinine. It is possible that current dyspnea is due mainly to COPD.  - I think that repeat left and right heart catheterization would be helpful to clarify things => significant fall in EF, also volume status looks ok despite significant dyspnea (confirm filling pressures and cardiac output).  We discussed risks/benefits of procedure and he agrees.  Hold warfarin 4 days prior.   - Add spironolactone 12.5 daily.  BMET/BNP today and again in 1 week.  - Continue current torsemide but would leave off metolazone until RHC.   - Continue Coreg.   - Eventually, would like to add Entresto.   - He qualifies for an ICD at this point with long-term low EF.  Nonischemic, so not as marked a benefit but age < 55.  Probably would not benefit much from AV nodal ablation/BiV pacing with controlled rate and RBBB.  3. COPD: Continue home oxygen.  4. CAD: Present on 6/17 cath but not enough to explain cardiomyopathy.  No exertional chest pain.  - He is on  warfarin so no ASA.  - Should be on statin given CAD, will address at cath.  5. AKI: BUN/creatinine up in the setting of daily metolazone.  This has been stopped.  BMET today.

## 2017-01-28 NOTE — Discharge Instructions (Signed)
Radial Site Care °Refer to this sheet in the next few weeks. These instructions provide you with information about caring for yourself after your procedure. Your health care provider may also give you more specific instructions. Your treatment has been planned according to current medical practices, but problems sometimes occur. Call your health care provider if you have any problems or questions after your procedure. °What can I expect after the procedure? °After your procedure, it is typical to have the following: °· Bruising at the radial site that usually fades within 1-2 weeks. °· Blood collecting in the tissue (hematoma) that may be painful to the touch. It should usually decrease in size and tenderness within 1-2 weeks. °Follow these instructions at home: °· Take medicines only as directed by your health care provider. °· You may shower 24-48 hours after the procedure or as directed by your health care provider. Remove the bandage (dressing) and gently wash the site with plain soap and water. Pat the area dry with a clean towel. Do not rub the site, because this may cause bleeding. °· Do not take baths, swim, or use a hot tub until your health care provider approves. °· Check your insertion site every day for redness, swelling, or drainage. °· Do not apply powder or lotion to the site. °· Do not flex or bend the affected arm for 24 hours or as directed by your health care provider. °· Do not push or pull heavy objects with the affected arm for 24 hours or as directed by your health care provider. °· Do not lift over 10 lb (4.5 kg) for 5 days after your procedure or as directed by your health care provider. °· Ask your health care provider when it is okay to: °¨ Return to work or school. °¨ Resume usual physical activities or sports. °¨ Resume sexual activity. °· Do not drive home if you are discharged the same day as the procedure. Have someone else drive you. °· You may drive 24 hours after the procedure  unless otherwise instructed by your health care provider. °· Do not operate machinery or power tools for 24 hours after the procedure. °· If your procedure was done as an outpatient procedure, which means that you went home the same day as your procedure, a responsible adult should be with you for the first 24 hours after you arrive home. °· Keep all follow-up visits as directed by your health care provider. This is important. °Contact a health care provider if: °· You have a fever. °· You have chills. °· You have increased bleeding from the radial site. Hold pressure on the site. °Get help right away if: °· You have unusual pain at the radial site. °· You have redness, warmth, or swelling at the radial site. °· You have drainage (other than a small amount of blood on the dressing) from the radial site. °· The radial site is bleeding, and the bleeding does not stop after 30 minutes of holding steady pressure on the site. °· Your arm or hand becomes pale, cool, tingly, or numb. °This information is not intended to replace advice given to you by your health care provider. Make sure you discuss any questions you have with your health care provider. °Document Released: 11/03/2010 Document Revised: 03/08/2016 Document Reviewed: 04/19/2014 °Elsevier Interactive Patient Education © 2017 [employer] ° °

## 2017-01-28 NOTE — Progress Notes (Signed)
Carelink Summary Report / Loop Recorder 

## 2017-01-28 NOTE — Interval H&P Note (Signed)
History and Physical Interval Note:  01/28/2017 11:56 AM  [name]  has presented today for surgery, with the diagnosis of chf  The various methods of treatment have been discussed with the patient and family. After consideration of risks, benefits and other options for treatment, the patient has consented to  Procedure(s): Right/Left Heart Cath and Coronary Angiography (N/A) as a surgical intervention .  The patient's history has been reviewed, patient examined, no change in status, stable for surgery.  I have reviewed the patient's chart and labs.  Questions were answered to the patient's satisfaction.     Girtie Wiersma [employer]

## 2017-01-29 ENCOUNTER — Other Ambulatory Visit: Payer: Self-pay

## 2017-01-29 ENCOUNTER — Encounter (HOSPITAL_COMMUNITY): Payer: Self-pay | Admitting: Cardiology

## 2017-01-29 NOTE — Patient Outreach (Signed)
Transition of care call:  Vitals:   01/29/17 1029  Weight: 283 lb (128.4 kg)   Placed call to patient who reports that he is doing well. Had a right and left cardiac cath yesterday. Reports this went well.  States MD may adjust medications.  Reports no new problems.  Patient reports that his skin continues to itch.   PLAN: Will continue weekly transition of care calls.  Tomasa Rand, RN, BSN, CEN [facility] [employer] [phone]

## 2017-02-01 ENCOUNTER — Ambulatory Visit (INDEPENDENT_AMBULATORY_CARE_PROVIDER_SITE_OTHER): Payer: Medicare HMO | Admitting: General Practice

## 2017-02-01 DIAGNOSIS — Z5181 Encounter for therapeutic drug level monitoring: Secondary | ICD-10-CM

## 2017-02-01 LAB — POCT INR: INR: 1.5

## 2017-02-01 NOTE — Progress Notes (Signed)
I have reviewed and agree with the plan. 

## 2017-02-01 NOTE — Patient Instructions (Signed)
Pre visit review using our clinic review tool, if applicable. No additional management support is needed unless otherwise documented below in the visit note. 

## 2017-02-04 ENCOUNTER — Other Ambulatory Visit: Payer: Self-pay | Admitting: Internal Medicine

## 2017-02-04 LAB — CUP PACEART REMOTE DEVICE CHECK
Date Time Interrogation Session: 20180413203848
Implantable Pulse Generator Implant Date: 20160622

## 2017-02-05 ENCOUNTER — Other Ambulatory Visit: Payer: Self-pay

## 2017-02-05 NOTE — Patient Outreach (Signed)
Transition of care:  Today's weight of 280.4  ( 02/05/2017) Yesterday's weight of 282.0 ( 02/04/2017)  Patient reports that he is doing well. Reports that he is having cramps from his calves to his toes. Reports toes are curling. Reports he has had cramping since last hospital admission.  Reports no shortness of breath.   Has follow up planned with cardiology tomorrow. Encouraged patient to discuss his cramping. He states that he will. Confirmed that patient has transportation.   PLAN: Will continue to follow weekly for transition of care.  Tomasa Rand, RN, BSN, CEN [facility] [employer] [phone]

## 2017-02-06 ENCOUNTER — Ambulatory Visit: Payer: Medicare HMO | Admitting: Nurse Practitioner

## 2017-02-06 ENCOUNTER — Telehealth: Payer: Self-pay | Admitting: *Deleted

## 2017-02-06 ENCOUNTER — Telehealth: Payer: Self-pay | Admitting: Nurse Practitioner

## 2017-02-06 NOTE — Telephone Encounter (Signed)
Pt called in to let Cecille Rubin know pt has cramps every night in lower extremities from 3:00 am till 5:00 am.  Pt sleeps on recliner. Stated really nothing to do but would send to [place] to advise.

## 2017-02-06 NOTE — Progress Notes (Deleted)
CARDIOLOGY OFFICE NOTE  Date:  02/06/2017    [name] Date of Birth: Feb 18, 1956 Medical Record #[id number]  PCP:  [name], MD  Cardiologist:  Ree Shay    No chief complaint on file.   History of Present Illness: [name] is a 61 y.o. male who presents today for a follow up visit. Seen for Dr. [name].   He has a history of diastolic CHF with last TEE in 2016 demonstrating EF 50-55%, asthma, COPD, chronic respiratory failure on intermittent use of nocturnal oxygen, atrial fibrillation s/p DCCV in 2016 - previously on amiodarone and now in permanent AF,on coumadin, s/p loop recorder for prior episode of syncope, HTN, HLD, BPH, and GERD. Nonobstructive CAD per cath from 2017 noted.   Last seen here back in September by Dr. [name].   He recentlypresented to the emergency department with complaints of progressive worsening of shortness of breath and weakness present for weeks worsening over previous 48 hours, having woken up feeling smothered. He called EMS, but refused transport due to cost, and later had cardiology remotely interrogate his loop. They found an accelerated heart rate 170 bpm and referred him to the ED for further evaluation. Upon arrival he was tachypneic with respirations of 22,heart rate was 145 and ECG showed atopy with RBBB and left fascicular block, and nonspecific ST- T wave abnormality.He was hypoxic, improved on 3 L O2. Blood work was unremarkable, CXR showed posterior right base infiltrate with small right pleural effusion suspicious for pneumonia superimposed on underlying pulmonary vascular congestion. The patient was admitted for acute on chronic respiratory failure secondary to CAP and decompensated CHF resulting in Afib w/ RVR. A full course of antibiotics wasgiven with negative cultures and resolution of all symptoms of infection. Rate was well-controlled. Lasix 80mg  IV BID was started with limited diuresis, so this was augmented.  Updated echocardiogram showed interval worsening of ejection fraction.   I saw him last week - weight already climbing back up. Bloated and short of breath. Gave short course of Zaroxolyn - only had to take 2 doses - lost 14 pounds with the first dose. Had to then adjust his diuretics.   Comes in today. Here alone today. Saw his PCP on Thursday - told to take the Zaroxolyn every day. He has done this. He does not feel good - feels nauseated. Vision is bad. Little anxious. Some shortness of breath. Weight is down. Dizzy and weak. No chest pain. Trying to do better about the salt - but admits it is hard - likes salt on apples. Going to CHF clinic later this week - he has been over the AF clinic before.  Comes in today. Here with   Past Medical History:  Diagnosis Date  . Anemia    supposed to be taking Vit B but doesn't  . ANXIETY    takes Xanax nightly  . Arthritis   . ASTHMA    Albuterol prn and Advair daily;also takes Prednisone daily  . Asthma   . Atrial fibrillation ([place]) 09/06/2015  . Cardiomyopathy [facility])    a. EF 25% TEE July 2013; b. EF normalized 2015;  c. 03/2015 Echo: EF 40-45%, difrf HK, PASP 38 mmHg, Mild MR, sev LAE/RAE.  [name] Chronic constipation    takes OTC stool softener  . COPD (chronic obstructive pulmonary disease) ([place])   . DEPRESSION    takes Zoloft and Doxepin daily  . Diverticulitis   . DYSKINESIA, ESOPHAGUS   . Essential  hypertension       . FIBROMYALGIA   . GERD (gastroesophageal reflux disease)       . Glaucoma   . HYPERLIPIDEMIA    a. Intolerant to statins.  . INSOMNIA    takes Ambien nightly  . Myocardial infarction [facility])    a. 2012 Myoview notable for prior infarct;  b. 03/2015 Lexiscan CL: EF 37%, diff HK, small area of inferior infarct from apex to base-->Med Rx.  . Myocardial infarction ([place])   . O2 dependent    uses oxygen 2 and 1/2 liters per  at hs  . Paroxysmal atrial fibrillation (HCC)    a. CHA2DS2VASc = 3--> takes Coumadin;  b.  03/15/2015 Successful TEE/DCCV;  c. 03/2015 recurrent afib, Amio d/c'd in setting of hyperthyroidism.  . Peripheral neuropathy   . Pneumonia 12/2016  . Rash and other nonspecific skin eruption 04/12/2009   no cause found saw dermatologists x 2 and allergist  . SLEEP APNEA, OBSTRUCTIVE    a. doesn't use CPAP  . Syncope    a. 03/2015 s/p MDT LINQ.  [name] Type II diabetes mellitus ([place])         Past Surgical History:  Procedure Laterality Date  . ACNE CYST REMOVAL     2 on back   . CARDIAC CATHETERIZATION N/A 03/21/2016   Procedure: Right/Left Heart Cath and Coronary Angiography;  Surgeon: Larey Dresser, MD;  Location: [place] CV [facility];  Service: Cardiovascular;  Laterality: N/A;  . CARDIOVERSION  04/18/2012   Procedure: CARDIOVERSION;  Surgeon: Fay Records, MD;  Location: [place];  Service: Cardiovascular;  Laterality: N/A;  . CARDIOVERSION  04/25/2012   Procedure: CARDIOVERSION;  Surgeon: Thayer Headings, MD;  Location: [place];  Service: Cardiovascular;  Laterality: N/A;  . CARDIOVERSION  04/25/2012   Procedure: CARDIOVERSION;  Surgeon: Fay Records, MD;  Location: [place];  Service: Cardiovascular;  Laterality: N/A;  . CARDIOVERSION  05/09/2012   Procedure: CARDIOVERSION;  Surgeon: Sherren Mocha, MD;  Location: [place];  Service: Cardiovascular;  Laterality: N/A;  changed from crenshaw to cooper by trish/leone-endo  . CARDIOVERSION N/A 03/15/2015   Procedure: CARDIOVERSION;  Surgeon: Thayer Headings, MD;  Location: [facility] ENDOSCOPY;  Service: Cardiovascular;  Laterality: N/A;  . COLONOSCOPY    . COLONOSCOPY WITH PROPOFOL N/A 10/21/2014   Procedure: COLONOSCOPY WITH PROPOFOL;  Surgeon: Ladene Artist, MD;  Location: [facility] ENDOSCOPY;  Service: Endoscopy;  Laterality: N/A;  . EP IMPLANTABLE DEVICE N/A 04/06/2015   Procedure: Loop Recorder Insertion;  Surgeon: Evans Lance, MD;  Location: [place] CV [facility];  Service: Cardiovascular;  Laterality: N/A;  . ESOPHAGOGASTRODUODENOSCOPY    . RIGHT/LEFT HEART  CATH AND CORONARY ANGIOGRAPHY N/A 01/28/2017   Procedure: Right/Left Heart Cath and Coronary Angiography;  Surgeon: Larey Dresser, MD;  Location: [place] [facility];  Service: Cardiovascular;  Laterality: N/A;  . TEE WITHOUT CARDIOVERSION  04/25/2012   Procedure: TRANSESOPHAGEAL ECHOCARDIOGRAM (TEE);  Surgeon: Thayer Headings, MD;  Location: [place];  Service: Cardiovascular;  Laterality: N/A;  . TEE WITHOUT CARDIOVERSION N/A 03/15/2015   Procedure: TRANSESOPHAGEAL ECHOCARDIOGRAM (TEE);  Surgeon: Thayer Headings, MD;  Location: [place];  Service: Cardiovascular;  Laterality: N/A;  . TONSILLECTOMY    . TOTAL KNEE ARTHROPLASTY Right 06/15/2014   Procedure: TOTAL KNEE ARTHROPLASTY;  Surgeon: Renette Butters, MD;  Location: [place];  Service: Orthopedics;  Laterality: Right;     Medications: Current Outpatient Prescriptions  Medication Sig Dispense Refill  .  ADVAIR DISKUS 250-50 MCG/DOSE AEPB INHALE 1 PUFF EVERY DAY AS DIRECTED (Patient taking differently: INHALE 1 PUFF ONCE A DAY IF NEEDED SHORTNESS OF BREATH) 60 each 3  . albuterol (PROVENTIL HFA;VENTOLIN HFA) 108 (90 BASE) MCG/ACT inhaler Inhale 2 puffs into the lungs every 6 (six) hours as needed for wheezing or shortness of breath.     . ALPRAZolam (XANAX) 0.5 MG tablet 1-2 tab by mouth at bedtime as needed for sleep (Patient taking differently: Take 1 mg by mouth at bedtime. ) 180 tablet 1  . carisoprodol (SOMA) 350 MG tablet TAKE 1 TABLET BY MOUTH 3 TIMES A DAY AS NEEDED FOR MUSCLE SPASMS 90 tablet 5  . carvedilol (COREG) 12.5 MG tablet TAKE 1 TABLET BY MOUTH TWICE A DAY WITH MEALS 60 tablet 5  . hydrocortisone 2.5 % cream Apply 1 application topically at bedtime. itching     . levothyroxine (SYNTHROID, LEVOTHROID) 25 MCG tablet TAKE 1 TABLET BY MOUTH EVERY DAY BEFORE BREAKFAST 90 tablet 2  . lidocaine (LIDODERM) 5 % Apply 1 patch every 12 hrs to affected area. Do not use more than one patch in 24 hrs. 100 patch 1  . Linaclotide  (LINZESS) 145 MCG CAPS capsule Take 145 mcg by mouth every other day.     [name] omeprazole (PRILOSEC) 20 MG capsule Take 1 capsule (20 mg total) by mouth daily with supper. 90 capsule 1  . potassium chloride SA (K-DUR,KLOR-CON) 20 MEQ tablet Take 2 tablets (40 mEq total) by mouth 2 (two) times daily. (Patient taking differently: Take 20 mEq by mouth 2 (two) times daily. ) 120 tablet 6  . predniSONE (DELTASONE) 5 MG tablet Take 0.5 tablets (2.5 mg total) by mouth daily with breakfast. 15 tablet 0  . sertraline (ZOLOFT) 100 MG tablet TAKE 2 TABLETS BY MOUTH AT BEDTIME (Patient taking differently: TAKE 1 TABLETS BY MOUTH AT BEDTIME) 180 tablet 3  . silver sulfADIAZINE (SILVADENE) 1 % cream APPLY TO AFFECTED AREA TOPICALLY EVERY DAY 50 g 3  . spironolactone (ALDACTONE) 25 MG tablet Take 0.5 tablets (12.5 mg total) by mouth daily. 45 tablet 3  . tamsulosin (FLOMAX) 0.4 MG CAPS capsule Take 2 capsules (0.8 mg total) by mouth daily after breakfast. 60 capsule 0  . torsemide (DEMADEX) 20 MG tablet TAKE 3 TABLETS BY MOUTH TWICE A DAY 180 tablet 3  . TUMS 500 MG chewable tablet Chew 500-2,000 mg by mouth every 4 (four) hours as needed for indigestion or heartburn.     . warfarin (COUMADIN) 5 MG tablet Take as directed by anticoagulation clinic (Patient taking differently: Take 2.5-5 mg by mouth See admin instructions. 2.5mg  on Mondays and Fridays, 5mg  all other days) 30 tablet 3  . zolpidem (AMBIEN) 10 MG tablet TAKE 1 TABLET BY MOUTH AT BEDTIME 30 tablet 5   No current facility-administered medications for this visit.     Allergies: Allergies  Allergen Reactions  . Amiodarone Other (See Comments)    hyperthyroidism  . Statins Other (See Comments)    myalgia  . Tape Other (See Comments)    Skin Tears Use Paper Tape Only    Social History: The patient  reports that he quit smoking about 9 years ago. His smoking use included Cigarettes. He has a 60.00 pack-year smoking history. He has never used  smokeless tobacco. He reports that he does not drink alcohol or use drugs.   Family History: The patient's ***family history includes Allergies in his mother; Asthma in his maternal grandmother and  mother; COPD in his mother; Colon polyps in his mother; Hypothyroidism in his mother.   Review of Systems: Please see the history of present illness.   Otherwise, the review of systems is positive for {NONE DEFAULTED:18576::"none"}.   All other systems are reviewed and negative.   Physical Exam: VS:  There were no vitals taken for this visit. [name]  BMI There is no height or weight on file to calculate BMI.  Wt Readings from Last 3 Encounters:  02/05/17 280 lb 6.4 oz (127.2 kg)  01/29/17 283 lb (128.4 kg)  01/28/17 283 lb (128.4 kg)    General: Pleasant. Well developed, well nourished and in no acute distress.   HEENT: Normal.  Neck: Supple, no JVD, carotid bruits, or masses noted.  Cardiac: ***Regular rate and rhythm. No murmurs, rubs, or gallops. No edema.  Respiratory:  Lungs are clear to auscultation bilaterally with normal work of breathing.  GI: Soft and nontender.  MS: No deformity or atrophy. Gait and ROM intact.  Skin: Warm and dry. Color is normal.  Neuro:  Strength and sensation are intact and no gross focal deficits noted.  Psych: Alert, appropriate and with normal affect.   LABORATORY DATA:  EKG:  EKG {ACTION; IS/IS NGE:[id number]} ordered today. This demonstrates ***.  Lab Results  Component Value Date   WBC 9.5 01/24/2017   HGB 14.1 01/24/2017   HCT 42.6 01/24/2017   PLT 216 01/24/2017   GLUCOSE 100 (H) 01/24/2017   CHOL 164 12/04/2016   TRIG 134.0 12/04/2016   HDL 35.20 (L) 12/04/2016   LDLDIRECT 111.0 05/30/2016   LDLCALC 102 (H) 12/04/2016   ALT 19 12/04/2016   AST 21 12/04/2016   NA 139 01/24/2017   K 3.7 01/24/2017   CL 103 01/24/2017   CREATININE 1.08 01/24/2017   BUN 37 (H) 01/24/2017   CO2 27 01/24/2017   TSH 1.48 11/29/2016   PSA 2.59 05/30/2016    INR 1.5 02/01/2017   HGBA1C 6.0 12/04/2016   MICROALBUR 1.2 05/30/2016    BNP (last 3 results)  Recent Labs  12/31/16 1136 01/24/17 1615  BNP 395.2* 182.9*    ProBNP (last 3 results)  Recent Labs  01/15/17 0922 01/21/17 1253  PROBNP 2,062* 904*     Other Studies Reviewed Today:  Right/Left Heart Cath and Coronary Angiography 01/28/17  Conclusion   1. Mildly elevated LV and RV filling pressures though LVEDP is not elevated.  2. Preserved cardiac output.  3. Nonobstructive CAD => nonischemic cardiomyopathy.   Would continue current torsemide 60 mg bid, does not need to restart metolazone at this point.   May resume coumadin tonight.     Echo Study Conclusions from 12/2016  - Left ventricle: The cavity size was severely dilated. There was mild concentric hypertrophy. Systolic function was severely reduced. The estimated ejection fraction was in the range of 10-15%.Diffuse hypokinesis. The study was not technically sufficient to allow evaluation of LV diastolic dysfunction due to atrial fibrillation. - Ventricular septum: Septal motion showed paradox. - Aortic valve: There was trivial regurgitation. - Mitral valve: There was mild regurgitation. - Left atrium: The atrium was severely dilated. - Right ventricle: The cavity size was moderately dilated. Wall thickness was normal. Systolic function was severely reduced. - Right atrium: The atrium was severely dilated. - Tricuspid valve: There was moderate regurgitation. - Pulmonary arteries: Systolic pressure was moderately increased. PA peak pressure: 45 mm Hg (S). - Impressions: Compared to the prior study on 08/29/2015 LVEF has decreased from 30-35%  to 10-15%. RVEF is also severely decreased.  Impressions:  - Compared to the prior study on 08/29/2015 LVEF has decreased from 30-35% to 10-15%. RVEF is also severely decreased.   -Compared to the prior study on 08/29/2015 LVEF has  decreased from 30-35% to 10-15%. RVEF is also severely decreased.    Echo Study Conclusions from 2016  - Procedure narrative: Transthoracic echocardiography. Image quality was suboptimal. The study was technically difficult. Intravenous contrast (Definity) was administered. - Left ventricle: The cavity size was mildly dilated. Wall thickness was normal. Systolic function was moderately to severely reduced. The estimated ejection fraction was in the range of 30% to 35%. Moderate diffuse hypokinesis with no identifiable regional variations. Acoustic contrast opacification revealed no evidence ofthrombus. - Left atrium: The atrium was severely dilated. - Right ventricle: The cavity size was moderately dilated. Systolic function was mildly reduced. - Right atrium: The atrium was severely dilated.  Notes Recorded by Deboraha Sprang, MD on 08/30/2015 at 9:34 AM Please Inform Patient Echo showed somewhat improved LV function; We will have to discuss next steps incl possible AV ablation and pacing for control of rate and presumed the cause of LV dysfunction    Assessment/Plan:  Acute on chronic hypoxemic respiratory failure: Multifactorial, associated with CAP, CHF exacerbation superimposed on COPD. Patient uses nocturnal O2 at home intermittently. He notes no more coughing.   CAP: RLL with associated small effusion. Strep pneumo Ag negative, HIV NR. No leukocytosis or fever. Finished abx 3/25. Blood cultures negative.  - FU CXR showed near complete clearing of right base infiltrate and tiny right pleural effusion. He looks better clinically.   Acute on chronic CHF: Update echocardiogram showed interval worsening. Compared to the prior study on 08/29/2015 LVEF has decreased from30-35% and now down to 10-15%.RVEF is also now severely decreased. Volume overload due to patient cutting back his torsemide dose to 60mg  daily (instead of BID) at home due to  frequent urination. EDW initially thoughtto be ~302lbs. 310lbs at admission. BNP 395. Tn neg. EDW at discharge is 293lbs.   His weight was up at his last visit - I gave him a short course of Zaroxolyn - now he has been taking daily on the advise of PCP - would prefer he be on a better CHF regimen with aldactone/Entresto instead. He is seeing CHF clinic later this week - his overall prognosis looks quite poor to me.  Plan was to repeat echo 3 months after medication optimization and AICD placement if EF remains <35%.  Also per Dr. Sallyanne Kuster - "may need toconsider AV node ablation and CRT implant. He has discussed CRT with Dr. [name] in the past - I think it was felt he might not respond due to absence of typical LBBB morphology".   I will be happy to follow back after he is seen in CHF. He needs close following in my opinion.   Afib: Managed with rate control and is on anticoagulation with Coumadin.  His nebivolol was changedto coreg per cardiology during this recent admission and he is no longer on diltiazem due to low EF. HR is stable today. He has had a tendency towards bradycardia.   Hypertension: BP now soft - explained how we want his BP in this range given the weakness of his heart muscle - he is not overly symptomatic. No changes with other medicines today other than stopping Zaroxolyn.    Current medicines are reviewed with the patient today.  The patient does not have concerns regarding  medicines other than what has been noted above.  The following changes have been made:  See above.  Labs/ tests ordered today include:   No orders of the defined types were placed in this encounter.    Disposition:   FU with *** in {gen number 7-35:670141} {Days to years:10300}.   Patient is agreeable to this plan and will call if any problems develop in the interim.   SignedTruitt Merle, NP  02/06/2017 12:34 PM  [facility] [address] [place], [place]  [address] Phone: [phone] Fax: [phone]

## 2017-02-06 NOTE — Telephone Encounter (Signed)
New message    Pt said he has a question that he needs to ask, since he did not need his appt this afternoon.

## 2017-02-07 NOTE — Telephone Encounter (Signed)
Pt stated Dr. [name] has increased pt's potassium and was just checked in Dr. Claris Gladden office.  I stated if pt had issue with potassium and increase to contact Dr.Mclean's office. Will route to [place] to [place].

## 2017-02-07 NOTE — Telephone Encounter (Signed)
He could let the CHF clinic know and have a potassium and Mg level drawn to make sure his electrolytes are ok.

## 2017-02-07 NOTE — Telephone Encounter (Signed)
Agree 

## 2017-02-09 ENCOUNTER — Other Ambulatory Visit: Payer: Self-pay | Admitting: Internal Medicine

## 2017-02-10 DIAGNOSIS — G4733 Obstructive sleep apnea (adult) (pediatric): Secondary | ICD-10-CM | POA: Diagnosis not present

## 2017-02-11 ENCOUNTER — Telehealth: Payer: Self-pay | Admitting: Internal Medicine

## 2017-02-11 DIAGNOSIS — J3089 Other allergic rhinitis: Secondary | ICD-10-CM

## 2017-02-11 DIAGNOSIS — R21 Rash and other nonspecific skin eruption: Secondary | ICD-10-CM

## 2017-02-11 NOTE — Telephone Encounter (Signed)
Suggest referral to [facility]- any of their doctors

## 2017-02-11 NOTE — Telephone Encounter (Signed)
Please to let pt know, the prednisone was not meant to be a long term tx due to side effects in future; I can refer him to a different dermatologist if he wants

## 2017-02-11 NOTE — Telephone Encounter (Signed)
Pt aware of recs.  rx sent to preferred pharmacy.  Nothing further needed.  

## 2017-02-11 NOTE — Telephone Encounter (Signed)
Pt states he has been dealing with an allergic skin rash Xseveral years- is currently seeing an allergist through [facility] in [place], but the copay and drive make it difficult for pt to follow up with care.  Pt states he saw CY once in our clinic for this (in 2015), and his skin is flaring up again.  Pt is requesting we refer him to an allergist in [place] for follow up care of this skin rash.  I asked pt if he had called his PCP about this since we have not seen him in 3 years- states his PCP deferred this to his allergist.    CY please advise if you'd like to refer pt to a [place] allergist.  Thanks.

## 2017-02-12 ENCOUNTER — Other Ambulatory Visit: Payer: Self-pay

## 2017-02-12 NOTE — Telephone Encounter (Signed)
Notified pt w/MD response he states Dr. Gwenette Greet has referred him to an allergy MD. He has broken out all over w/red patches all over his body, and it is very uncomfortable...[name]

## 2017-02-12 NOTE — Patient Outreach (Signed)
Final transition of care call: Placed call to patient who reports that he is okay. Reports worsening skin lesions.  According to EMR; patient is being referred to allergist.  Patient states that he is waiting for an appointment.   Patient reports tiny amounts of swelling. Weight continues to decline.  Reports that he is exercising on a regular basis.  Denies any new problems or concerns.  PLAN: reviewed with patient completion of transition of care program successfully. Will plan to follow up with patient again in days unless he calls sooner. Patient in agreement.  Tomasa Rand, RN, BSN, CEN [facility] [employer] [phone]

## 2017-02-12 NOTE — Telephone Encounter (Signed)
Pt has been made aware.[name]/LMB

## 2017-02-12 NOTE — Telephone Encounter (Signed)
Unfortunately the answer is the same, except to please keep the allergy referral per dr Iris Pert daily prednisone is not advised for the long term

## 2017-02-13 ENCOUNTER — Other Ambulatory Visit: Payer: Self-pay | Admitting: Internal Medicine

## 2017-02-14 ENCOUNTER — Ambulatory Visit (INDEPENDENT_AMBULATORY_CARE_PROVIDER_SITE_OTHER): Payer: Medicare HMO | Admitting: General Practice

## 2017-02-14 DIAGNOSIS — Z5181 Encounter for therapeutic drug level monitoring: Secondary | ICD-10-CM | POA: Diagnosis not present

## 2017-02-14 LAB — POCT INR: INR: 1.9

## 2017-02-14 NOTE — Patient Instructions (Signed)
Pre visit review using our clinic review tool, if applicable. No additional management support is needed unless otherwise documented below in the visit note. 

## 2017-02-14 NOTE — Telephone Encounter (Signed)
Already addressed recently; no refill

## 2017-02-14 NOTE — Progress Notes (Signed)
I have reviewed and agree with the plan. 

## 2017-02-15 ENCOUNTER — Ambulatory Visit: Payer: Medicare HMO

## 2017-02-19 ENCOUNTER — Ambulatory Visit (INDEPENDENT_AMBULATORY_CARE_PROVIDER_SITE_OTHER): Payer: Medicare HMO | Admitting: Allergy and Immunology

## 2017-02-19 ENCOUNTER — Encounter: Payer: Self-pay | Admitting: Allergy and Immunology

## 2017-02-19 ENCOUNTER — Encounter (HOSPITAL_COMMUNITY): Payer: Self-pay | Admitting: *Deleted

## 2017-02-19 ENCOUNTER — Other Ambulatory Visit: Payer: Self-pay | Admitting: Internal Medicine

## 2017-02-19 VITALS — BP 90/70 | HR 73 | Temp 97.7°F | Resp 12 | Ht 67.0 in | Wt 281.2 lb

## 2017-02-19 DIAGNOSIS — Z8709 Personal history of other diseases of the respiratory system: Secondary | ICD-10-CM | POA: Insufficient documentation

## 2017-02-19 DIAGNOSIS — J3089 Other allergic rhinitis: Secondary | ICD-10-CM

## 2017-02-19 DIAGNOSIS — L308 Other specified dermatitis: Secondary | ICD-10-CM

## 2017-02-19 DIAGNOSIS — J310 Chronic rhinitis: Secondary | ICD-10-CM | POA: Insufficient documentation

## 2017-02-19 DIAGNOSIS — J438 Other emphysema: Secondary | ICD-10-CM

## 2017-02-19 MED ORDER — FLUTICASONE PROPIONATE 50 MCG/ACT NA SUSP: 2.0000 | Freq: Every day | NASAL | 5 refills | Status: DC | PRN

## 2017-02-19 MED ORDER — MOMETASONE FUROATE 0.1 % EX OINT: TOPICAL_OINTMENT | CUTANEOUS | 3 refills | Status: DC

## 2017-02-19 NOTE — Patient Instructions (Addendum)
Spongiotic dermatitis Environmental and food allergen skin tests were negative today with the exception of dust mite antigen which was positive on intradermal testing.  This does not appear to be an atopic issue.   A prescription has been provided for mometasone 0.1% ointment sparingly to affected areas daily as needed.  To help control pruritus, he may take fexofenadine (Allegra) 180 mg daily as needed.  Follow up with dermatologist for monitoring and further recommendations.  History of nasal polyposis  A prescription has been provided for fluticasone nasal spray, 2 sprays per nostril daily as needed. Proper nasal spray technique has been discussed and demonstrated.  I have also recommended nasal saline spray (i.e. Simply Saline) as needed and prior to medicated nasal sprays.  COPD (chronic obstructive pulmonary disease) (HCC)  Continue Advair Diskus 250-50 g, one inhalation twice a day, and albuterol every 6 hours as needed.  Continue O2 therapy as prescribed.  Allergic rhinitis  Aeroallergen avoidance measures have been discussed and provided in written form.  Fluticasone nasal spray, fexofenadine (Allegra) as needed, and nasal saline irrigation (as above).   Return if symptoms worsen or fail to improve.  Control of House Dust Mite Allergen  House dust mites play a major role in allergic asthma and rhinitis.  They occur in environments with high humidity wherever human skin, the food for dust mites is found. High levels have been detected in dust obtained from mattresses, pillows, carpets, upholstered furniture, bed covers, clothes and soft toys.  The principal allergen of the house dust mite is found in its feces.  A gram of dust may contain 1,000 mites and 250,000 fecal particles.  Mite antigen is easily measured in the air during house cleaning activities.    1. Encase mattresses, including the box spring, and pillow, in an air tight cover.  Seal the zipper end of the  encased mattresses with wide adhesive tape. 2. Wash the bedding in water of 130 degrees Farenheit weekly.  Avoid cotton comforters/quilts and flannel bedding: the most ideal bed covering is the dacron comforter. 3. Remove all upholstered furniture from the bedroom. 4. Remove carpets, carpet padding, rugs, and non-washable window drapes from the bedroom.  Wash drapes weekly or use plastic window coverings. 5. Remove all non-washable stuffed toys from the bedroom.  Wash stuffed toys weekly. 6. Have the room cleaned frequently with a vacuum cleaner and a damp dust-mop.  The patient should not be in a room which is being cleaned and should wait 1 hour after cleaning before going into the room. 7. Close and seal all heating outlets in the bedroom.  Otherwise, the room will become filled with dust-laden air.  An electric heater can be used to heat the room. 8. Reduce indoor humidity to less than 50%.  Do not use a humidifier.

## 2017-02-19 NOTE — Assessment & Plan Note (Addendum)
Environmental and food allergen skin tests were negative today with the exception of dust mite antigen which was positive on intradermal testing.  This does not appear to be an atopic issue.   A prescription has been provided for mometasone 0.1% ointment sparingly to affected areas daily as needed.  To help control pruritus, he may take fexofenadine (Allegra) 180 mg daily as needed.  Follow up with dermatologist for monitoring and further recommendations.

## 2017-02-19 NOTE — Telephone Encounter (Signed)
This is third request, and third denial. thanks

## 2017-02-19 NOTE — Assessment & Plan Note (Signed)
   A prescription has been provided for fluticasone nasal spray, 2 sprays per nostril daily as needed. Proper nasal spray technique has been discussed and demonstrated.  I have also recommended nasal saline spray (i.e. Simply Saline) as needed and prior to medicated nasal sprays.

## 2017-02-19 NOTE — Progress Notes (Signed)
New Patient Note  RE: [name] MRN: [id number] DOB: [birth year] Date of Office Visit: 02/19/2017  Referring provider: [name], MD Primary care provider: [name], MD  Chief Complaint: Rash and Pruritus   History of present illness: [name] is a 61 y.o. male seen today in consultation requested by [name], MD.  He reports that approximately 4 years ago he began to develop a persistent rash, primarily involving his back as well as his abdomen, upper extremities, and lower extremities.  On occasion, the rash includes his face, however hydrocortisone 2.5% cream helps to keep this a day.  He uses triamcinolone 0.1% cream on his body and takes prednisone daily in an attempt to control the exquisite pruritus, though even this only "takes it down a notch."  The pruritus increases when he showers and scratches his skin.  He believes that the rash may have been related to mold exposure from his CPAP machine a few years ago, though he is uncertain.  Other than this, no specific food or environmental triggers have been identified.  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  The recommendation have been made to undergo light therapy, however he has not done so based upon the driving distance and co-pay cost of the treatment.  He is interested in assessing his atopic status to see if an allergy may be contributing to or causing this problem. He has COPD/emphysema requiring oxygen, which he typically wears at nighttime.  He takes Advair 250-50 g, one inhalation twice a day.  He states that he has been diagnosed with nasal polyposis.  He uses nasal saline spray for this problem.  He experiences nasal congestion and occasional sinus pressure.   No significant seasonal symptom variation has been noted nor have specific environmental triggers been identified.  He has obstructive sleep apnea, atrial fibrillation, type 2 diabetes mellitus,  essential hypertension, and gastroesophageal reflux disease.   Assessment and plan: Spongiotic dermatitis Environmental and food allergen skin tests were negative today with the exception of dust mite antigen which was positive on intradermal testing.  This does not appear to be an atopic issue.   A prescription has been provided for mometasone 0.1% ointment sparingly to affected areas daily as needed.  To help control pruritus, he may take fexofenadine (Allegra) 180 mg daily as needed.  Follow up with dermatologist for monitoring and further recommendations.  History of nasal polyposis  A prescription has been provided for fluticasone nasal spray, 2 sprays per nostril daily as needed. Proper nasal spray technique has been discussed and demonstrated.  I have also recommended nasal saline spray (i.e. Simply Saline) as needed and prior to medicated nasal sprays.  COPD (chronic obstructive pulmonary disease) (HCC)  Continue Advair Diskus 250-50 g, one inhalation twice a day, and albuterol every 6 hours as needed.  Continue O2 therapy as prescribed.  Allergic rhinitis  Aeroallergen avoidance measures have been discussed and provided in written form.  Fluticasone nasal spray, fexofenadine (Allegra) as needed, and nasal saline irrigation (as above).   Meds ordered this encounter  Medications  . mometasone (ELOCON) 0.1 % ointment    Sig: Apply sparingly to affected areas daily as needed    Dispense:  45 g    Refill:  3  . fluticasone (FLONASE) 50 MCG/ACT nasal spray    Sig: Place 2 sprays into both nostrils daily as needed for allergies or rhinitis.    Dispense:  18.2  g    Refill:  5    Diagnostics: Spirometry: FVC was 2.76 L (74% predicted) and FEV1 was 1.86 L (63% predicted). Mild restrictive/obstructive pattern. Epicutaneous testing:  Negative despite a positive histamine control. Intradermal testing: Positive to dust mite antigen. Food allergen skin testing:  Negative  despite a positive histamine control.    Physical examination: Blood pressure 90/70, pulse 73, temperature 97.7 F (36.5 C), temperature source Oral, resp. rate 12, height 5\' 7"  (1.702 m), weight 281 lb 3.2 oz (127.6 kg), SpO2 96 %.  General: Alert, interactive, in no acute distress. HEENT: TMs pearly gray, turbinates moderately edematous without discharge, post-pharynx moderately erythematous. Neck: Supple without lymphadenopathy. Lungs: Mildly decreased breath sounds bilaterally without wheezing, rhonchi or rales. CV: Normal S1, S2 without murmurs. Abdomen: Nondistended, nontender. Skin: scattered scabs in various degrees of healing on upper,  lower extremitis, abdomen and back. Stasis dermatitis on distal lower extremities. Extremities:  No clubbing, cyanosis or edema. Neuro:   Grossly intact.  Review of systems:  Review of systems negative except as noted in HPI / PMHx or noted below: Review of Systems  Constitutional: Negative.   HENT: Negative.   Eyes: Negative.   Respiratory: Negative.   Cardiovascular: Negative.   Gastrointestinal: Negative.   Genitourinary: Negative.   Musculoskeletal: Negative.   Skin: Negative.   Neurological: Negative.   Endo/Heme/Allergies: Negative.   Psychiatric/Behavioral: Negative.     Past medical history:  Past Medical History:  Diagnosis Date  . Anemia    supposed to be taking Vit B but doesn't  . ANXIETY    takes Xanax nightly  . Arthritis   . ASTHMA    Albuterol prn and Advair daily;also takes Prednisone daily  . Asthma   . Atrial fibrillation ([place]) 09/06/2015  . Cardiomyopathy [facility])    a. EF 25% TEE July 2013; b. EF normalized 2015;  c. 03/2015 Echo: EF 40-45%, difrf HK, PASP 38 mmHg, Mild MR, sev LAE/RAE.  [name] Chronic constipation    takes OTC stool softener  . COPD (chronic obstructive pulmonary disease) ([place])   . DEPRESSION    takes Zoloft and Doxepin daily  . Diverticulitis   . DYSKINESIA, ESOPHAGUS   . Essential  hypertension       . FIBROMYALGIA   . GERD (gastroesophageal reflux disease)       . Glaucoma   . HYPERLIPIDEMIA    a. Intolerant to statins.  . INSOMNIA    takes Ambien nightly  . Myocardial infarction [facility])    a. 2012 Myoview notable for prior infarct;  b. 03/2015 Lexiscan CL: EF 37%, diff HK, small area of inferior infarct from apex to base-->Med Rx.  . Myocardial infarction ([place])   . O2 dependent    uses oxygen 2 and 1/2 liters per [place] at hs  . Paroxysmal atrial fibrillation (HCC)    a. CHA2DS2VASc = 3--> takes Coumadin;  b. 03/15/2015 Successful TEE/DCCV;  c. 03/2015 recurrent afib, Amio d/c'd in setting of hyperthyroidism.  . Peripheral neuropathy   . Pneumonia 12/2016  . Rash and other nonspecific skin eruption 04/12/2009   no cause found saw dermatologists x 2 and allergist  . SLEEP APNEA, OBSTRUCTIVE    a. doesn't use CPAP  . Syncope    a. 03/2015 s/p MDT LINQ.  [name] Type II diabetes mellitus (HCC)         Past surgical history:  Past Surgical History:  Procedure Laterality Date  . ACNE CYST REMOVAL     2  on back   . ADENOIDECTOMY    . CARDIAC CATHETERIZATION N/A 03/21/2016   Procedure: Right/Left Heart Cath and Coronary Angiography;  Surgeon: Larey Dresser, MD;  Location: [place] CV LAB;  Service: Cardiovascular;  Laterality: N/A;  . CARDIOVERSION  04/18/2012   Procedure: CARDIOVERSION;  Surgeon: Fay Records, MD;  Location: [place];  Service: Cardiovascular;  Laterality: N/A;  . CARDIOVERSION  04/25/2012   Procedure: CARDIOVERSION;  Surgeon: Thayer Headings, MD;  Location: [place];  Service: Cardiovascular;  Laterality: N/A;  . CARDIOVERSION  04/25/2012   Procedure: CARDIOVERSION;  Surgeon: Fay Records, MD;  Location: [place];  Service: Cardiovascular;  Laterality: N/A;  . CARDIOVERSION  05/09/2012   Procedure: CARDIOVERSION;  Surgeon: Sherren Mocha, MD;  Location: [place];  Service: Cardiovascular;  Laterality: N/A;  changed from crenshaw to cooper by trish/leone-endo    . CARDIOVERSION N/A 03/15/2015   Procedure: CARDIOVERSION;  Surgeon: Thayer Headings, MD;  Location: [facility] ENDOSCOPY;  Service: Cardiovascular;  Laterality: N/A;  . COLONOSCOPY    . COLONOSCOPY WITH PROPOFOL N/A 10/21/2014   Procedure: COLONOSCOPY WITH PROPOFOL;  Surgeon: Ladene Artist, MD;  Location: [facility] ENDOSCOPY;  Service: Endoscopy;  Laterality: N/A;  . EP IMPLANTABLE DEVICE N/A 04/06/2015   Procedure: Loop Recorder Insertion;  Surgeon: Evans Lance, MD;  Location: [place] CV LAB;  Service: Cardiovascular;  Laterality: N/A;  . ESOPHAGOGASTRODUODENOSCOPY    . RIGHT/LEFT HEART CATH AND CORONARY ANGIOGRAPHY N/A 01/28/2017   Procedure: Right/Left Heart Cath and Coronary Angiography;  Surgeon: Larey Dresser, MD;  Location: [place] CV LAB;  Service: Cardiovascular;  Laterality: N/A;  . TEE WITHOUT CARDIOVERSION  04/25/2012   Procedure: TRANSESOPHAGEAL ECHOCARDIOGRAM (TEE);  Surgeon: Thayer Headings, MD;  Location: [place];  Service: Cardiovascular;  Laterality: N/A;  . TEE WITHOUT CARDIOVERSION N/A 03/15/2015   Procedure: TRANSESOPHAGEAL ECHOCARDIOGRAM (TEE);  Surgeon: Thayer Headings, MD;  Location: [place];  Service: Cardiovascular;  Laterality: N/A;  . TONSILLECTOMY    . TOTAL KNEE ARTHROPLASTY Right 06/15/2014   Procedure: TOTAL KNEE ARTHROPLASTY;  Surgeon: Renette Butters, MD;  Location: [place];  Service: Orthopedics;  Laterality: Right;    Family history: Family History  Problem Relation Age of Onset  . COPD Mother   . Asthma Mother   . Colon polyps Mother   . Allergies Mother   . Hypothyroidism Mother   . Asthma Maternal Grandmother   . Colon cancer Neg Hx     Social history: Social History   Social History  . Marital status: Divorced    Spouse name: N/A  . Number of children: 2  . Years of education: N/A   Occupational History  . retired/disabled. prev worked in Therapist, sports. Disabled   Social History Main Topics  . Smoking status: Former Smoker     Packs/day: 2.00    Years: 30.00    Types: Cigarettes    Quit date: 10/16/2007  . Smokeless tobacco: Never Used  . Alcohol use No  . Drug use: No  . Sexual activity: Not Currently   Other Topics Concern  . Not on file   Social History Narrative   Lives alone.   Environmental History: The patient lives in a 48-year-old mobile home with carpeting throughout and central air/heat.  There is one dog in the resident's which does not have access to his bedroom.  There is no known mold/water damage in the home.  He is a former cigarette smoker having  started in 1975 and having quit in 2009.  He had averaged smoking 2-3 packs per day during those years.  Allergies as of 02/19/2017      Reactions   Amiodarone Other (See Comments)   hyperthyroidism   Statins Other (See Comments)   myalgia   Tape Other (See Comments)   Skin Tears Use Paper Tape Only      Medication List       Accurate as of 02/19/17  5:24 PM. Always use your most recent med list.          ADVAIR DISKUS 250-50 MCG/DOSE Aepb Generic drug:  Fluticasone-Salmeterol INHALE 1 PUFF EVERY DAY AS DIRECTED   albuterol 108 (90 Base) MCG/ACT inhaler Commonly known as:  PROVENTIL HFA;VENTOLIN HFA Inhale 2 puffs into the lungs every 6 (six) hours as needed for wheezing or shortness of breath.   ALPRAZolam 0.5 MG tablet Commonly known as:  XANAX 1-2 tab by mouth at bedtime as needed for sleep   aspirin-acetaminophen-caffeine 250-250-65 MG tablet Commonly known as:  EXCEDRIN MIGRAINE   carisoprodol 350 MG tablet Commonly known as:  SOMA TAKE 1 TABLET BY MOUTH 3 TIMES A DAY AS NEEDED FOR MUSCLE SPASMS   carvedilol 12.5 MG tablet Commonly known as:  COREG TAKE 1 TABLET BY MOUTH TWICE A DAY WITH MEALS   fluticasone 50 MCG/ACT nasal spray Commonly known as:  FLONASE Place 2 sprays into both nostrils daily as needed for allergies or rhinitis.   hydrocortisone 2.5 % cream Apply 1 application topically at bedtime. itching     levothyroxine 25 MCG tablet Commonly known as:  SYNTHROID, LEVOTHROID TAKE 1 TABLET BY MOUTH EVERY DAY BEFORE BREAKFAST   lidocaine 5 % Commonly known as:  LIDODERM Apply 1 patch every 12 hrs to affected area. Do not use more than one patch in 24 hrs.   linaclotide 145 MCG Caps capsule Commonly known as:  LINZESS Take 145 mcg by mouth every other day.   mometasone 0.1 % ointment Commonly known as:  ELOCON Apply sparingly to affected areas daily as needed   niacin 500 MG tablet   omeprazole 20 MG capsule Commonly known as:  PRILOSEC Take 1 capsule (20 mg total) by mouth daily with supper.   potassium chloride SA 20 MEQ tablet Commonly known as:  K-DUR,KLOR-CON Take 2 tablets (40 mEq total) by mouth 2 (two) times daily.   predniSONE 5 MG tablet Commonly known as:  DELTASONE Take 0.5 tablets (2.5 mg total) by mouth daily with breakfast.   sertraline 100 MG tablet Commonly known as:  ZOLOFT TAKE 2 TABLETS BY MOUTH AT BEDTIME   silver sulfADIAZINE 1 % cream Commonly known as:  SILVADENE APPLY TO AFFECTED AREA TOPICALLY EVERY DAY   spironolactone 25 MG tablet Commonly known as:  ALDACTONE Take 0.5 tablets (12.5 mg total) by mouth daily.   tamsulosin 0.4 MG Caps capsule Commonly known as:  FLOMAX Take 2 capsules (0.8 mg total) by mouth daily after breakfast.   torsemide 20 MG tablet Commonly known as:  DEMADEX TAKE 3 TABLETS BY MOUTH TWICE A DAY   TUMS 500 MG chewable tablet Generic drug:  calcium carbonate Chew 500-2,000 mg by mouth every 4 (four) hours as needed for indigestion or heartburn.   calcium carbonate 500 MG chewable tablet Commonly known as:  TUMS - dosed in mg elemental calcium   vitamin E 400 UNIT capsule   warfarin 5 MG tablet Commonly known as:  COUMADIN Take as directed by anticoagulation clinic  zolpidem 10 MG tablet Commonly known as:  AMBIEN TAKE 1 TABLET BY MOUTH AT BEDTIME       Known medication allergies: Allergies  Allergen  Reactions  . Amiodarone Other (See Comments)    hyperthyroidism  . Statins Other (See Comments)    myalgia  . Tape Other (See Comments)    Skin Tears Use Paper Tape Only    I appreciate the opportunity to take part in Ritchie's care. Please do not hesitate to contact me with questions.  Sincerely,   R. Edgar Frisk, MD

## 2017-02-19 NOTE — Assessment & Plan Note (Signed)
   Aeroallergen avoidance measures have been discussed and provided in written form.  Fluticasone nasal spray, fexofenadine (Allegra) as needed, and nasal saline irrigation (as above).

## 2017-02-19 NOTE — Assessment & Plan Note (Signed)
   Continue Advair Diskus 250-50 g, one inhalation twice a day, and albuterol every 6 hours as needed.  Continue O2 therapy as prescribed.

## 2017-02-19 NOTE — Assessment & Plan Note (Deleted)
   Fluticasone nasal spray and nasal saline irrigation (as above).

## 2017-02-21 DIAGNOSIS — J449 Chronic obstructive pulmonary disease, unspecified: Secondary | ICD-10-CM | POA: Diagnosis not present

## 2017-02-22 ENCOUNTER — Encounter (HOSPITAL_COMMUNITY): Payer: Self-pay

## 2017-02-22 ENCOUNTER — Ambulatory Visit (HOSPITAL_COMMUNITY)
Admission: RE | Admit: 2017-02-22 | Discharge: 2017-02-22 | Disposition: A | Discharge status: Home / Self Care | Payer: Medicare HMO | Source: Ambulatory Visit | Attending: Cardiology | Admitting: Cardiology

## 2017-02-22 ENCOUNTER — Telehealth (HOSPITAL_COMMUNITY): Payer: Self-pay | Admitting: *Deleted

## 2017-02-22 VITALS — BP 91/60 | HR 64 | Ht 67.0 in | Wt 282.5 lb

## 2017-02-22 DIAGNOSIS — I110 Hypertensive heart disease with heart failure: Secondary | ICD-10-CM | POA: Diagnosis not present

## 2017-02-22 DIAGNOSIS — Z9981 Dependence on supplemental oxygen: Secondary | ICD-10-CM | POA: Diagnosis not present

## 2017-02-22 DIAGNOSIS — Z79899 Other long term (current) drug therapy: Secondary | ICD-10-CM | POA: Insufficient documentation

## 2017-02-22 DIAGNOSIS — Z7982 Long term (current) use of aspirin: Secondary | ICD-10-CM | POA: Insufficient documentation

## 2017-02-22 DIAGNOSIS — I481 Persistent atrial fibrillation: Secondary | ICD-10-CM

## 2017-02-22 DIAGNOSIS — Z7901 Long term (current) use of anticoagulants: Secondary | ICD-10-CM | POA: Diagnosis not present

## 2017-02-22 DIAGNOSIS — K219 Gastro-esophageal reflux disease without esophagitis: Secondary | ICD-10-CM | POA: Insufficient documentation

## 2017-02-22 DIAGNOSIS — I482 Chronic atrial fibrillation: Secondary | ICD-10-CM | POA: Insufficient documentation

## 2017-02-22 DIAGNOSIS — E785 Hyperlipidemia, unspecified: Secondary | ICD-10-CM | POA: Diagnosis not present

## 2017-02-22 DIAGNOSIS — J449 Chronic obstructive pulmonary disease, unspecified: Secondary | ICD-10-CM | POA: Diagnosis not present

## 2017-02-22 DIAGNOSIS — Z87891 Personal history of nicotine dependence: Secondary | ICD-10-CM | POA: Diagnosis not present

## 2017-02-22 DIAGNOSIS — I4819 Other persistent atrial fibrillation: Secondary | ICD-10-CM

## 2017-02-22 DIAGNOSIS — I5022 Chronic systolic (congestive) heart failure: Secondary | ICD-10-CM

## 2017-02-22 DIAGNOSIS — I2510 Atherosclerotic heart disease of native coronary artery without angina pectoris: Secondary | ICD-10-CM | POA: Insufficient documentation

## 2017-02-22 DIAGNOSIS — I428 Other cardiomyopathies: Secondary | ICD-10-CM | POA: Diagnosis not present

## 2017-02-22 LAB — BASIC METABOLIC PANEL
Anion gap: 7 (ref 5–15)
BUN: 22 mg/dL — ABNORMAL HIGH (ref 6–20)
CO2: 24 mmol/L (ref 22–32)
Calcium: 9 mg/dL (ref 8.9–10.3)
Chloride: 106 mmol/L (ref 101–111)
Creatinine, Ser: 1.03 mg/dL (ref 0.61–1.24)
GFR calc Af Amer: >60 mL/min (ref >60)
GFR calc non Af Amer: >60 mL/min (ref >60)
Glucose, Bld: 92 mg/dL (ref 65–99)
Potassium: 4 mmol/L (ref 3.5–5.1)
Sodium: 137 mmol/L (ref 135–145)

## 2017-02-22 LAB — BRAIN NATRIURETIC PEPTIDE: B Natriuretic Peptide: 261 pg/mL — ABNORMAL HIGH (ref 0.0–100.0)

## 2017-02-22 MED ORDER — POTASSIUM CHLORIDE CRYS ER 20 MEQ PO TBCR: EXTENDED_RELEASE_TABLET | ORAL | 2 refills | Status: DC

## 2017-02-22 MED ORDER — SPIRONOLACTONE 25 MG PO TABS: 25.0000 mg | ORAL_TABLET | Freq: Every day | ORAL | 3 refills | Status: DC

## 2017-02-22 NOTE — Patient Instructions (Signed)
Increase Spironolactone to 25 mg daily  Decrease Potassium to 40 meq (2 tabs) in AM and 20 meq (1 tab) in PM  Labs today  Labs in 10-14 days  Your physician recommends that you schedule a follow-up appointment in: 1 month

## 2017-02-22 NOTE — Telephone Encounter (Signed)
Josem Kaufmann #[id number] exp.02/27/17

## 2017-02-24 NOTE — Progress Notes (Signed)
PCP: Dr. [name] EP: [name] HF Cardiology: [name]  61 yo with history of COPD on home oxygen, permanent atrial fibrillation, and chronic systolic CHF was referred by Dr. [name] for evaluation of CHF.  Amiodarone and DCCV were tried for atrial fibrillation in the past without success.   Patient has been noted to have a low EF since 2013.  EF improved by to normal by 2015 but dropped to 30-35% by 11/16.  Cath in 6/17 showed some CAD but not enough to cause cardiomyopathy.  He was admitted in 3/18 with PNA, atrial fibrillation/RVR, and acute on chronic systolic CHF.  Since that hospitalization, he has been wearing oxygen at all times.  Echo in 3/18 showed fall in EF to 10-15%.   He has struggled with dyspnea recently.  He had been on torsemide 60 mg bid and was taking metolazone daily for a number of days.  Metolazone was stopped due to rise in creatinine.  I took him for right and left heart cath in 4/18, which showed nonobstructive CAD and relatively optimized filling pressures.  He seems to be doing fairly well.  He walks his dog, no dyspnea walking on flat ground.  Dyspnea walking up stairs.  He has chronic orthopnea, has slept in recliner x years.  No PND.  Occasional transient atypical chest pain.  BP remains soft, but no lightheadedness, syncope, or falls.  No BRBPR or melena.   He is on a low dose of prednisone chronically.  Says he was put on this for pruritis.   Labs (4/18): pro-BNP 904, K 3.1, creatinine 1.39 => 1.08 with BUN 66 => 37, hgb 14.1.   PMH: 1. Asthma 2. COPD: On home oxygen. Prior smoker.  3. Atrial fibrillation: Permanent.  He failed DCCV and amiodarone in the past . 4. HTN 5. GERD 6. Hyperlipidemia 7. BPH.  8. Chronic systolic CHF: Nonischemic cardiomyopathy.  - EF 25% by TEE in 7/13.  Normalized on 2015 echo. - Echo (11/16): EF 30-35%.  - LHC/RHC (6/17): 80% ostial stenosis small D1, 30-40% pLAD.  Mean RA 4, PA 26/10, mean PCWP 11, CI 2.33.  - Echo (3/18): EF 10-15%,  diffuse hypokinesis, severe LV dilation, moderate RV dilation with severely decreased systolic function.  - LHC/RHC (4/18): 40% proximal LAD.  Mean RA 8, PA 37/10 mean 22, mean PCWP 22, LVEDP 9, CI 2.39. 9. Morbid obesity 10. ILR in place.   Social History   Social History  . Marital status: Divorced    Spouse name: N/A  . Number of children: 2  . Years of education: N/A   Occupational History  . retired/disabled. prev worked in Therapist, sports. Disabled   Social History Main Topics  . Smoking status: Former Smoker    Packs/day: 2.00    Years: 30.00    Types: Cigarettes    Quit date: 10/16/2007  . Smokeless tobacco: Never Used  . Alcohol use No  . Drug use: No  . Sexual activity: Not Currently   Other Topics Concern  . Not on file   Social History Narrative   Lives alone.   Family History  Problem Relation Age of Onset  . COPD Mother   . Asthma Mother   . Colon polyps Mother   . Allergies Mother   . Hypothyroidism Mother   . Asthma Maternal Grandmother   . Colon cancer Neg Hx    ROS: All systems reviewed and negative except as per HPI.   Current Outpatient Prescriptions  Medication Sig Dispense  Refill  . ADVAIR DISKUS 250-50 MCG/DOSE AEPB INHALE 1 PUFF EVERY DAY AS DIRECTED (Patient taking differently: INHALE 1 PUFF ONCE A DAY IF NEEDED SHORTNESS OF BREATH) 60 each 3  . albuterol (PROVENTIL HFA;VENTOLIN HFA) 108 (90 BASE) MCG/ACT inhaler Inhale 2 puffs into the lungs every 6 (six) hours as needed for wheezing or shortness of breath.     . ALPRAZolam (XANAX) 0.5 MG tablet 1-2 tab by mouth at bedtime as needed for sleep (Patient taking differently: Take 1 mg by mouth at bedtime. ) 180 tablet 1  . aspirin-acetaminophen-caffeine (EXCEDRIN MIGRAINE) 250-250-65 MG tablet     . calcium carbonate (TUMS - DOSED IN MG ELEMENTAL CALCIUM) 500 MG chewable tablet     . carisoprodol (SOMA) 350 MG tablet TAKE 1 TABLET BY MOUTH 3 TIMES A DAY AS NEEDED FOR MUSCLE SPASMS 90 tablet 5   . carvedilol (COREG) 12.5 MG tablet TAKE 1 TABLET BY MOUTH TWICE A DAY WITH MEALS 60 tablet 5  . fluticasone (FLONASE) 50 MCG/ACT nasal spray Place 2 sprays into both nostrils daily as needed for allergies or rhinitis. 18.2 g 5  . hydrocortisone 2.5 % cream Apply 1 application topically at bedtime. itching     . levothyroxine (SYNTHROID, LEVOTHROID) 25 MCG tablet TAKE 1 TABLET BY MOUTH EVERY DAY BEFORE BREAKFAST 90 tablet 2  . lidocaine (LIDODERM) 5 % Apply 1 patch every 12 hrs to affected area. Do not use more than one patch in 24 hrs. 100 patch 1  . Linaclotide (LINZESS) 145 MCG CAPS capsule Take 145 mcg by mouth every other day.     . mometasone (ELOCON) 0.1 % ointment Apply sparingly to affected areas daily as needed 45 g 3  . niacin 500 MG tablet     . omeprazole (PRILOSEC) 20 MG capsule Take 1 capsule (20 mg total) by mouth daily with supper. 90 capsule 1  . potassium chloride SA (K-DUR,KLOR-CON) 20 MEQ tablet Take 2 tabs in AM and 1 tab in PM 270 tablet 2  . predniSONE (DELTASONE) 5 MG tablet Take 0.5 tablets (2.5 mg total) by mouth daily with breakfast. 15 tablet 0  . sertraline (ZOLOFT) 100 MG tablet TAKE 2 TABLETS BY MOUTH AT BEDTIME (Patient taking differently: TAKE 1 TABLETS BY MOUTH AT BEDTIME) 180 tablet 3  . silver sulfADIAZINE (SILVADENE) 1 % cream APPLY TO AFFECTED AREA TOPICALLY EVERY DAY 50 g 3  . spironolactone (ALDACTONE) 25 MG tablet Take 1 tablet (25 mg total) by mouth daily. 90 tablet 3  . tamsulosin (FLOMAX) 0.4 MG CAPS capsule Take 2 capsules (0.8 mg total) by mouth daily after breakfast. 60 capsule 0  . torsemide (DEMADEX) 20 MG tablet TAKE 3 TABLETS BY MOUTH TWICE A DAY 180 tablet 3  . TUMS 500 MG chewable tablet Chew 500-2,000 mg by mouth every 4 (four) hours as needed for indigestion or heartburn.     . vitamin E 400 UNIT capsule     . warfarin (COUMADIN) 5 MG tablet Take as directed by anticoagulation clinic (Patient taking differently: Take 2.5-5 mg by mouth See  admin instructions. 2.5mg  on Mondays and Fridays, 5mg  all other days) 30 tablet 3  . zolpidem (AMBIEN) 10 MG tablet TAKE 1 TABLET BY MOUTH AT BEDTIME 30 tablet 5   No current facility-administered medications for this encounter.    BP 91/60   Pulse 64   Ht 5\' 7"  (1.702 m)   Wt 282 lb 8 oz (128.1 kg)   SpO2 98%  BMI 44.25 kg/m  General: NAD, obese Neck: Thick, JVP 7 cm, no thyromegaly or thyroid nodule.  Lungs: Clear to auscultation bilaterally with normal respiratory effort. CV: Nondisplaced PMI.  Heart irregular S1/S2, no S3/S4, no murmur.  Trace ankle edema.  No carotid bruit.  Normal pedal pulses.  Abdomen: Soft, nontender, no hepatosplenomegaly, no distention.  Skin: Intact without lesions or rashes.  Neurologic: Alert and oriented x 3.  Psych: Normal affect. Extremities: No clubbing or cyanosis.  Bilateral lower leg venous varicosities.  HEENT: Normal.   Assessment/Plan:  1. Atrial fibrillation: Permanent, good rate control.   - Continue Coreg and warfarin.  2. COPD: No longer smoking.  He is on home oxygen.   3. Chronic systolic CHF: Nonischemic cardiomyopathy based on 6/17 cath. However, he had a recent significant fall in EF from 30-35% to 10-15% on last echo from 3/18.  LHC/RHC in 4/18 showed nonobstructive CAD and relatively optimized filling pressures.  He seems to be doing better overall.  Weight is down another 5 lbs.  NYHA class II, no volume overload on exam.  He does not have much BP room for medication adjustment   - Increase spironolactone to 25 mg daily, decrease KCl to 40 qam/20 qpm..  BMET/BNP today and again in 10 days.  - Continue current torsemide 60 mg bid.    - Continue Coreg.   - Eventually, would like to add losartan versus Entresto.   - He qualifies for an ICD at this point with long-term low EF.  Nonischemic, so not as marked a benefit but age < 81.  Probably would not benefit much from AV nodal ablation/BiV pacing with controlled rate and RBBB.  3.  COPD: Continue home oxygen.  4. CAD: Nonobstructive on 4/18 cath.  No exertional chest pain.  - He is on warfarin so no ASA.  - Reasonable to start statin in future with some CAD on exam. .   Loralie Champagne 02/24/2017

## 2017-02-25 ENCOUNTER — Other Ambulatory Visit: Payer: Self-pay | Admitting: Internal Medicine

## 2017-02-25 ENCOUNTER — Ambulatory Visit (INDEPENDENT_AMBULATORY_CARE_PROVIDER_SITE_OTHER): Payer: Medicare HMO | Admitting: *Deleted

## 2017-02-25 ENCOUNTER — Telehealth: Payer: Self-pay | Admitting: Cardiology

## 2017-02-25 DIAGNOSIS — R55 Syncope and collapse: Secondary | ICD-10-CM | POA: Diagnosis not present

## 2017-02-25 DIAGNOSIS — I428 Other cardiomyopathies: Secondary | ICD-10-CM

## 2017-02-25 DIAGNOSIS — I255 Ischemic cardiomyopathy: Secondary | ICD-10-CM

## 2017-02-25 DIAGNOSIS — I4819 Other persistent atrial fibrillation: Secondary | ICD-10-CM

## 2017-02-25 NOTE — Telephone Encounter (Signed)
Pt called and stated that around 3 AM this morning he woke up he could feel a real fast heart beat that he believes was Atrial Fib. Pt wears oxygen so he stays short of breath, he doesn't know if he was dizzy b/c he didn't get up, moderate chest pain. Instructed pt to send a remote transmission using his home monitor. Informed him that once the transmission is received a Device Tech RN will review and call him back w/ results.

## 2017-02-25 NOTE — Telephone Encounter (Signed)
Spoke with pt about episode from this morning, informed pt that remote transmission was received and that if did show that pt was in atrial fib, but other than that there were no abnormalities. Pt stated that his chest pain was more of a "soreness' but he was feeling fine now. Informed pt that note would be forwarded to Dr . [name] and Dr. [name] and he would be contacted if any further follow up was needed. Pt voiced understanding.

## 2017-02-25 NOTE — Telephone Encounter (Signed)
He has permanent atrial fibrillation and recent cath with mild nonobstructive coronary disease, so would not change anything as long as he is feeling ok now and symptoms do not recur.

## 2017-02-25 NOTE — Progress Notes (Signed)
Carelink Summary Report / Loop Recorder 

## 2017-02-25 NOTE — Telephone Encounter (Signed)
Pt aware to call if symptoms reoccur

## 2017-02-26 NOTE — Telephone Encounter (Signed)
Done hardcopy to [employer] to [employer]

## 2017-02-26 NOTE — Telephone Encounter (Signed)
faxed

## 2017-03-04 ENCOUNTER — Telehealth: Payer: Self-pay | Admitting: *Deleted

## 2017-03-04 ENCOUNTER — Encounter: Payer: Self-pay | Admitting: Internal Medicine

## 2017-03-04 ENCOUNTER — Ambulatory Visit (HOSPITAL_COMMUNITY)
Admission: RE | Admit: 2017-03-04 | Discharge: 2017-03-04 | Disposition: A | Discharge status: Home / Self Care | Payer: Medicare HMO | Source: Ambulatory Visit | Attending: Cardiology | Admitting: Cardiology

## 2017-03-04 DIAGNOSIS — I428 Other cardiomyopathies: Secondary | ICD-10-CM | POA: Insufficient documentation

## 2017-03-04 LAB — BASIC METABOLIC PANEL
Anion gap: 6 (ref 5–15)
BUN: 12 mg/dL (ref 6–20)
CALCIUM: 8.4 mg/dL — AB (ref 8.9–10.3)
CO2: 28 mmol/L (ref 22–32)
CREATININE: 0.79 mg/dL (ref 0.61–1.24)
Chloride: 104 mmol/L (ref 101–111)
GFR calc Af Amer: >60 mL/min (ref >60)
GLUCOSE: 118 mg/dL — AB (ref 65–99)
Potassium: 3.5 mmol/L (ref 3.5–5.1)
Sodium: 138 mmol/L (ref 135–145)

## 2017-03-04 NOTE — Telephone Encounter (Signed)
Spoke with patient regarding symptom episodes and sending manual transmission to review symptom activated episodes. Patient states he did not feel well this weekend-- swelling in legs increased, SOB, moderate chest pain. Patient states he has been taking his medications as prescribed. Manual transmission received. Full report reviewed, 4 ECGs appears permanent AF with some RVR. Informed patient phone note will be forwarded to Dr. [name] and Dr. [name] and he will be contacted if any other follow-up is needed. Patient verbalized understanding.

## 2017-03-04 NOTE — Telephone Encounter (Signed)
With increased edema and dyspnea, need to see him in the office.  Please arrange appt with me or PA/NP.

## 2017-03-05 ENCOUNTER — Other Ambulatory Visit: Payer: Self-pay

## 2017-03-05 ENCOUNTER — Ambulatory Visit (HOSPITAL_COMMUNITY)
Admission: RE | Admit: 2017-03-05 | Discharge: 2017-03-05 | Disposition: A | Discharge status: Home / Self Care | Payer: Medicare HMO | Source: Ambulatory Visit | Attending: Internal Medicine | Admitting: Internal Medicine

## 2017-03-05 ENCOUNTER — Telehealth (HOSPITAL_COMMUNITY): Payer: Self-pay

## 2017-03-05 VITALS — BP 113/75 | HR 71 | Wt 285.0 lb

## 2017-03-05 DIAGNOSIS — Z7901 Long term (current) use of anticoagulants: Secondary | ICD-10-CM | POA: Diagnosis not present

## 2017-03-05 DIAGNOSIS — Z6841 Body Mass Index (BMI) 40.0 and over, adult: Secondary | ICD-10-CM | POA: Insufficient documentation

## 2017-03-05 DIAGNOSIS — I4891 Unspecified atrial fibrillation: Secondary | ICD-10-CM | POA: Diagnosis not present

## 2017-03-05 DIAGNOSIS — I110 Hypertensive heart disease with heart failure: Secondary | ICD-10-CM | POA: Diagnosis not present

## 2017-03-05 DIAGNOSIS — I5022 Chronic systolic (congestive) heart failure: Secondary | ICD-10-CM | POA: Insufficient documentation

## 2017-03-05 DIAGNOSIS — Z825 Family history of asthma and other chronic lower respiratory diseases: Secondary | ICD-10-CM | POA: Insufficient documentation

## 2017-03-05 DIAGNOSIS — Z9981 Dependence on supplemental oxygen: Secondary | ICD-10-CM | POA: Insufficient documentation

## 2017-03-05 DIAGNOSIS — I482 Chronic atrial fibrillation: Secondary | ICD-10-CM | POA: Diagnosis not present

## 2017-03-05 DIAGNOSIS — K219 Gastro-esophageal reflux disease without esophagitis: Secondary | ICD-10-CM | POA: Diagnosis not present

## 2017-03-05 DIAGNOSIS — R0602 Shortness of breath: Secondary | ICD-10-CM | POA: Insufficient documentation

## 2017-03-05 DIAGNOSIS — J449 Chronic obstructive pulmonary disease, unspecified: Secondary | ICD-10-CM | POA: Insufficient documentation

## 2017-03-05 DIAGNOSIS — Z87891 Personal history of nicotine dependence: Secondary | ICD-10-CM | POA: Insufficient documentation

## 2017-03-05 DIAGNOSIS — I428 Other cardiomyopathies: Secondary | ICD-10-CM | POA: Insufficient documentation

## 2017-03-05 DIAGNOSIS — Z8349 Family history of other endocrine, nutritional and metabolic diseases: Secondary | ICD-10-CM | POA: Insufficient documentation

## 2017-03-05 DIAGNOSIS — Z79899 Other long term (current) drug therapy: Secondary | ICD-10-CM | POA: Diagnosis not present

## 2017-03-05 DIAGNOSIS — J209 Acute bronchitis, unspecified: Secondary | ICD-10-CM

## 2017-03-05 DIAGNOSIS — I2510 Atherosclerotic heart disease of native coronary artery without angina pectoris: Secondary | ICD-10-CM | POA: Diagnosis not present

## 2017-03-05 DIAGNOSIS — E785 Hyperlipidemia, unspecified: Secondary | ICD-10-CM | POA: Insufficient documentation

## 2017-03-05 DIAGNOSIS — N400 Enlarged prostate without lower urinary tract symptoms: Secondary | ICD-10-CM | POA: Diagnosis not present

## 2017-03-05 DIAGNOSIS — R05 Cough: Secondary | ICD-10-CM | POA: Diagnosis not present

## 2017-03-05 LAB — BRAIN NATRIURETIC PEPTIDE: B NATRIURETIC PEPTIDE 5: 316.7 pg/mL — AB (ref 0.0–100.0)

## 2017-03-05 LAB — CBC
HEMATOCRIT: 41.1 % (ref 39.0–52.0)
HEMOGLOBIN: 13.1 g/dL (ref 13.0–17.0)
MCH: 29.7 pg (ref 26.0–34.0)
MCHC: 31.9 g/dL (ref 30.0–36.0)
MCV: 93.2 fL (ref 78.0–100.0)
Platelets: 178 10*3/uL (ref 150–400)
RBC: 4.41 MIL/uL (ref 4.22–5.81)
RDW: 17.2 % — ABNORMAL HIGH (ref 11.5–15.5)
WBC: 8.6 10*3/uL (ref 4.0–10.5)

## 2017-03-05 LAB — CUP PACEART REMOTE DEVICE CHECK
Date Time Interrogation Session: 20180513213825
Implantable Pulse Generator Implant Date: 20160622

## 2017-03-05 LAB — BASIC METABOLIC PANEL
ANION GAP: 9 (ref 5–15)
BUN: 13 mg/dL (ref 6–20)
CO2: 28 mmol/L (ref 22–32)
Calcium: 8.8 mg/dL — ABNORMAL LOW (ref 8.9–10.3)
Chloride: 102 mmol/L (ref 101–111)
Creatinine, Ser: 0.9 mg/dL (ref 0.61–1.24)
Glucose, Bld: 102 mg/dL — ABNORMAL HIGH (ref 65–99)
POTASSIUM: 3.8 mmol/L (ref 3.5–5.1)
SODIUM: 139 mmol/L (ref 135–145)

## 2017-03-05 MED ORDER — PREDNISONE 10 MG (21) PO TBPK: ORAL_TABLET | ORAL | 0 refills | Status: DC

## 2017-03-05 MED ORDER — METOLAZONE 2.5 MG PO TABS: 2.5000 mg | ORAL_TABLET | ORAL | 1 refills | Status: DC

## 2017-03-05 MED ORDER — DOXYCYCLINE HYCLATE 100 MG PO CAPS: 100.0000 mg | ORAL_CAPSULE | Freq: Two times a day (BID) | ORAL | 0 refills | Status: AC

## 2017-03-05 NOTE — Telephone Encounter (Signed)
Spoke with [facility], patient, and Dr. [name] regarding patient's worsending s/s over past week. Patient had labs done yesterday that looked stable, however, patient continues to have worsening SOB, cough, difficulty sleeping, swelling in BLE and abdomen, fatigue, and "just don't feel right". Dr. [name] asking to add patient on for exam.  Spoke with Dr. Haroldine Laws who has okayed patient to be added on his schedule today at 21. Patient aware of appointment time and advised to come to CHF clinic at this time.   Renee Pain, RN

## 2017-03-05 NOTE — Patient Outreach (Signed)
Telephone call: Incoming message from patient to call him.  Placed call to patient who reports that he is swelling.  Reports increased swelling, shortness of breath and he feels like he has fluid on his chest.  States that he is in afib.    Reviewed EMR and noted Dr. Claris Gladden note that patient needs office visit.  Placed call to office and secured appointment at [place] patient back and he states he will be there.  Appreciative of help getting appointment.  Tomasa Rand, RN, BSN, CEN [facility] [employer] [phone]

## 2017-03-05 NOTE — Progress Notes (Signed)
ADVANCED HF CLINIC NOTE  PCP: Dr. [name] EP: [name] HF Cardiology: [name]  61 yo with history of COPD on home oxygen, permanent atrial fibrillation, and chronic systolic CHF was referred by Dr. [name] for evaluation of CHF.  Amiodarone and DCCV were tried for atrial fibrillation in the past without success.   Patient has been noted to have a low EF since 2013.  EF improved by to normal by 2015 but dropped to 30-35% by 11/16.  Cath in 6/17 showed some CAD but not enough to cause cardiomyopathy.  He was admitted in 3/18 with PNA, atrial fibrillation/RVR, and acute on chronic systolic CHF.  Since that hospitalization, he has been wearing oxygen at all times.  Echo in 3/18 showed fall in EF to 10-15%.   He has struggled with dyspnea recently.  He had been on torsemide 60 mg bid and was taking metolazone daily for a number of days.  Metolazone was stopped due to rise in creatinine.  Dr. [name] took him for right and left heart cath in 4/18, which showed nonobstructive CAD and relatively optimized filling pressures.    Presents today for add-on visit for severe cough with brown-purple sputum. Feels terrible. SOB with any activity. No fever. + Chills. No sick contacts. Weight up 5 pound. + edema. Took an extra fluid pill this am without much effect.   Labs (4/18): pro-BNP 904, K 3.1, creatinine 1.39 => 1.08 with BUN 66 => 37, hgb 14.1.   PMH: 1. Asthma 2. COPD: On home oxygen. Prior smoker.  3. Atrial fibrillation: Permanent.  He failed DCCV and amiodarone in the past . 4. HTN 5. GERD 6. Hyperlipidemia 7. BPH.  8. Chronic systolic CHF: Nonischemic cardiomyopathy.  - EF 25% by TEE in 7/13.  Normalized on 2015 echo. - Echo (11/16): EF 30-35%.  - LHC/RHC (6/17): 80% ostial stenosis small D1, 30-40% pLAD.  Mean RA 4, PA 26/10, mean PCWP 11, CI 2.33.  - Echo (3/18): EF 10-15%, diffuse hypokinesis, severe LV dilation, moderate RV dilation with severely decreased systolic function.  - LHC/RHC  (4/18): 40% proximal LAD.  Mean RA 8, PA 37/10 mean 22, mean PCWP 22, LVEDP 9, CI 2.39. 9. Morbid obesity 10. ILR in place.   Social History   Social History  . Marital status: Divorced    Spouse name: N/A  . Number of children: 2  . Years of education: N/A   Occupational History  . retired/disabled. prev worked in Therapist, sports. Disabled   Social History Main Topics  . Smoking status: Former Smoker    Packs/day: 2.00    Years: 30.00    Types: Cigarettes    Quit date: 10/16/2007  . Smokeless tobacco: Never Used  . Alcohol use No  . Drug use: No  . Sexual activity: Not Currently   Other Topics Concern  . Not on file   Social History Narrative   Lives alone.   Family History  Problem Relation Age of Onset  . COPD Mother   . Asthma Mother   . Colon polyps Mother   . Allergies Mother   . Hypothyroidism Mother   . Asthma Maternal Grandmother   . Colon cancer Neg Hx    ROS: All systems reviewed and negative except as per HPI.   Current Outpatient Prescriptions  Medication Sig Dispense Refill  . ADVAIR DISKUS 250-50 MCG/DOSE AEPB INHALE 1 PUFF EVERY DAY AS DIRECTED (Patient taking differently: INHALE 1 PUFF ONCE A DAY IF NEEDED SHORTNESS OF  BREATH) 60 each 3  . albuterol (PROVENTIL HFA;VENTOLIN HFA) 108 (90 BASE) MCG/ACT inhaler Inhale 2 puffs into the lungs every 6 (six) hours as needed for wheezing or shortness of breath.     . ALPRAZolam (XANAX) 0.5 MG tablet TAKE 1 TO 2 TABLETS BY MOUTH AT BEDTIME AS NEEDED SLEEP 180 tablet 1  . aspirin-acetaminophen-caffeine (EXCEDRIN MIGRAINE) 250-250-65 MG tablet     . carisoprodol (SOMA) 350 MG tablet TAKE 1 TABLET BY MOUTH 3 TIMES A DAY AS NEEDED FOR MUSCLE SPASMS 90 tablet 5  . carvedilol (COREG) 12.5 MG tablet TAKE 1 TABLET BY MOUTH TWICE A DAY WITH MEALS 60 tablet 5  . fluticasone (FLONASE) 50 MCG/ACT nasal spray Place 2 sprays into both nostrils daily as needed for allergies or rhinitis. 18.2 g 5  . hydrocortisone 2.5 %  cream Apply 1 application topically at bedtime. itching     . levothyroxine (SYNTHROID, LEVOTHROID) 25 MCG tablet TAKE 1 TABLET BY MOUTH EVERY DAY BEFORE BREAKFAST 90 tablet 2  . lidocaine (LIDODERM) 5 % Apply 1 patch every 12 hrs to affected area. Do not use more than one patch in 24 hrs. 100 patch 1  . Linaclotide (LINZESS) 145 MCG CAPS capsule Take 145 mcg by mouth every other day.     . mometasone (ELOCON) 0.1 % ointment Apply sparingly to affected areas daily as needed 45 g 3  . niacin 500 MG tablet     . omeprazole (PRILOSEC) 20 MG capsule Take 1 capsule (20 mg total) by mouth daily with supper. 90 capsule 1  . potassium chloride SA (K-DUR,KLOR-CON) 20 MEQ tablet Take 2 tabs in AM and 1 tab in PM 270 tablet 2  . sertraline (ZOLOFT) 100 MG tablet TAKE 2 TABLETS BY MOUTH AT BEDTIME (Patient taking differently: TAKE 1 TABLETS BY MOUTH AT BEDTIME) 180 tablet 3  . silver sulfADIAZINE (SILVADENE) 1 % cream APPLY TO AFFECTED AREA TOPICALLY EVERY DAY 50 g 3  . spironolactone (ALDACTONE) 25 MG tablet Take 1 tablet (25 mg total) by mouth daily. 90 tablet 3  . tamsulosin (FLOMAX) 0.4 MG CAPS capsule Take 2 capsules (0.8 mg total) by mouth daily after breakfast. 60 capsule 0  . torsemide (DEMADEX) 20 MG tablet TAKE 3 TABLETS BY MOUTH TWICE A DAY 180 tablet 3  . TUMS 500 MG chewable tablet Chew 500-2,000 mg by mouth every 4 (four) hours as needed for indigestion or heartburn.     . vitamin E 400 UNIT capsule     . warfarin (COUMADIN) 5 MG tablet Take as directed by anticoagulation clinic (Patient taking differently: Take 2.5-5 mg by mouth See admin instructions. 2.5mg  on Mondays and Fridays, 5mg  all other days) 30 tablet 3  . zolpidem (AMBIEN) 10 MG tablet TAKE 1 TABLET BY MOUTH AT BEDTIME 30 tablet 5   No current facility-administered medications for this encounter.    BP 113/75   Pulse 71   Wt 285 lb (129.3 kg)   SpO2 98%   BMI 44.64 kg/m  General: NAD, obese+ hacking cough  Neck: Thick, JVP  looks flat cm, no thyromegaly or thyroid nodule.  Lungs: Hacking cough. Bronchial BS. No wheezes CV: Nondisplaced PMI.  Heart irregular S1/S2, no S3/S4, no murmur.  1+ ankle edema.  No carotid bruit.  Normal pedal pulses.  Abdomen: Markedly obese. Soft, NT/ND good BS Skin: Intact without lesions or rashes.  Neurologic: Alert and oriented x 3. Moves all 4 ext without difficulty  Psych: Normal affect. Extremities: No  clubbing or cyanosis.  Sever b ilateral lower leg venous varicosities.  HEENT: Normal. Anicteric  Assessment/Plan:  1. Cough/SOB - I think he has bronchitis/COPD flare >> HF. Though weight is up a little bit - Will treat with doxy 100 bid and prednisone taper. Watch for extra fluid. Will give one dose metolazone today.  - Check labs and BNP 2. Atrial fibrillation: Permanent, good rate control.   - Continue Coreg and warfarin. Watch INR with abx. Check INR on Friday 3. COPD: No longer smoking.  He is on home oxygen.  See above 4. Chronic systolic CHF: Nonischemic cardiomyopathy based on 6/17 cath. However, he had a recent significant fall in EF from 30-35% to 10-15% on last echo from 3/18.  LHC/RHC in 4/18 showed nonobstructive CAD and relatively optimized filling pressures.  He seems to be doing better overall.  Weight is down another 5 lbs.  NYHA class II, no volume overload on exam.  He does not have much BP room for medication adjustment   - Continue spiro  - Continue current torsemide 60 mg bid.    - Continue Coreg.   - Eventually, would like to add losartan versus Entresto per Dr. [name] - He qualifies for an ICD at this point with long-term low EF.  Nonischemic, so not as marked a benefit but age < 65.  Probably would not benefit much from AV nodal ablation/BiV pacing with controlled rate and RBBB.  4. CAD: Nonobstructive on 4/18 cath.  No exertional chest pain.  - He is on warfarin so no ASA.  - Reasonable to start statin in future with some CAD  [name],  [name] 03/05/2017

## 2017-03-05 NOTE — Patient Instructions (Signed)
Start Doxycycline 100 mg Twice daily FOR 7 DAYS  Prednisone Taper:  4 tabs daily for 2 days  3 tabs daily for 2 days  2 tabs daily for 2 days  1 tab daily for 1 day  Take Metolazone 2.5 mg TODAY ONLY  Labs today  Coumadin Clinic on Friday 5/25 for INR check because we started you on the Doxycycline  Keep follow up appointment as scheduled on 6/11

## 2017-03-08 ENCOUNTER — Ambulatory Visit (INDEPENDENT_AMBULATORY_CARE_PROVIDER_SITE_OTHER): Payer: Medicare HMO | Admitting: General Practice

## 2017-03-08 DIAGNOSIS — Z5181 Encounter for therapeutic drug level monitoring: Secondary | ICD-10-CM | POA: Diagnosis not present

## 2017-03-08 LAB — POCT INR: INR: 3.7

## 2017-03-08 NOTE — Patient Instructions (Signed)
Pre visit review using our clinic review tool, if applicable. No additional management support is needed unless otherwise documented below in the visit note. 

## 2017-03-12 DIAGNOSIS — G4733 Obstructive sleep apnea (adult) (pediatric): Secondary | ICD-10-CM | POA: Diagnosis not present

## 2017-03-12 NOTE — Telephone Encounter (Signed)
Patient OV with Dr. Haroldine Laws 5/22

## 2017-03-13 ENCOUNTER — Other Ambulatory Visit: Payer: Self-pay

## 2017-03-13 ENCOUNTER — Other Ambulatory Visit (HOSPITAL_COMMUNITY): Payer: Self-pay | Admitting: Internal Medicine

## 2017-03-13 NOTE — Patient Outreach (Signed)
60 day post discharge phone call: Placed call to patient today to follow up on progress. Patient states that he feels good. Reports cough is decreased and he feeling better. Patient reports that he is weighing daily and taking all medications as prescribed.  Reports that he saw an allergist and has an ointment to use on his bumps.    Reports that he has follow up appointment planned.  Reviewed case closure with patient and his goals are met. He is in agreement in the future to call for additional problems. Patient states that he has learned his lesson to take his medications as prescribed.  PLAN: Will close case as goals are met. Will send MD and patient case closure letters.

## 2017-03-14 ENCOUNTER — Ambulatory Visit: Payer: Self-pay

## 2017-03-15 ENCOUNTER — Other Ambulatory Visit: Payer: Self-pay | Admitting: Internal Medicine

## 2017-03-15 NOTE — Patient Outreach (Signed)
Addendum: Note for 03/13/2017 not completed due to flowsheet problem with care plan. Problem unable to be fixed.  Care plan redone and placed in care plan section of EMR and care plan finalized so chart could be close. All goals met.  Tomasa Rand, RN, BSN, CEN [facility] [employer] [phone]

## 2017-03-22 ENCOUNTER — Ambulatory Visit (INDEPENDENT_AMBULATORY_CARE_PROVIDER_SITE_OTHER): Payer: Medicare HMO | Admitting: General Practice

## 2017-03-22 DIAGNOSIS — Z5181 Encounter for therapeutic drug level monitoring: Secondary | ICD-10-CM | POA: Diagnosis not present

## 2017-03-22 LAB — POCT INR: INR: 4.2

## 2017-03-22 NOTE — Progress Notes (Signed)
I have reviewed and agree with the plan. 

## 2017-03-22 NOTE — Patient Instructions (Signed)
Pre visit review using our clinic review tool, if applicable. No additional management support is needed unless otherwise documented below in the visit note. 

## 2017-03-24 DIAGNOSIS — J449 Chronic obstructive pulmonary disease, unspecified: Secondary | ICD-10-CM | POA: Diagnosis not present

## 2017-03-25 ENCOUNTER — Ambulatory Visit (HOSPITAL_COMMUNITY)
Admission: RE | Admit: 2017-03-25 | Discharge: 2017-03-25 | Disposition: A | Discharge status: Home / Self Care | Payer: Medicare HMO | Source: Ambulatory Visit | Attending: Internal Medicine | Admitting: Internal Medicine

## 2017-03-25 VITALS — BP 85/58 | HR 65 | Wt 282.4 lb

## 2017-03-25 DIAGNOSIS — Z6841 Body Mass Index (BMI) 40.0 and over, adult: Secondary | ICD-10-CM | POA: Insufficient documentation

## 2017-03-25 DIAGNOSIS — I482 Chronic atrial fibrillation: Secondary | ICD-10-CM | POA: Insufficient documentation

## 2017-03-25 DIAGNOSIS — Z79899 Other long term (current) drug therapy: Secondary | ICD-10-CM | POA: Diagnosis not present

## 2017-03-25 DIAGNOSIS — I2510 Atherosclerotic heart disease of native coronary artery without angina pectoris: Secondary | ICD-10-CM | POA: Diagnosis not present

## 2017-03-25 DIAGNOSIS — I5032 Chronic diastolic (congestive) heart failure: Secondary | ICD-10-CM

## 2017-03-25 DIAGNOSIS — I4891 Unspecified atrial fibrillation: Secondary | ICD-10-CM

## 2017-03-25 DIAGNOSIS — I5022 Chronic systolic (congestive) heart failure: Secondary | ICD-10-CM

## 2017-03-25 DIAGNOSIS — Z7982 Long term (current) use of aspirin: Secondary | ICD-10-CM | POA: Insufficient documentation

## 2017-03-25 DIAGNOSIS — K219 Gastro-esophageal reflux disease without esophagitis: Secondary | ICD-10-CM | POA: Diagnosis not present

## 2017-03-25 DIAGNOSIS — N400 Enlarged prostate without lower urinary tract symptoms: Secondary | ICD-10-CM | POA: Insufficient documentation

## 2017-03-25 DIAGNOSIS — Z87891 Personal history of nicotine dependence: Secondary | ICD-10-CM | POA: Insufficient documentation

## 2017-03-25 DIAGNOSIS — I110 Hypertensive heart disease with heart failure: Secondary | ICD-10-CM | POA: Insufficient documentation

## 2017-03-25 DIAGNOSIS — I959 Hypotension, unspecified: Secondary | ICD-10-CM

## 2017-03-25 DIAGNOSIS — E785 Hyperlipidemia, unspecified: Secondary | ICD-10-CM | POA: Diagnosis not present

## 2017-03-25 DIAGNOSIS — J449 Chronic obstructive pulmonary disease, unspecified: Secondary | ICD-10-CM | POA: Insufficient documentation

## 2017-03-25 DIAGNOSIS — J209 Acute bronchitis, unspecified: Secondary | ICD-10-CM | POA: Diagnosis not present

## 2017-03-25 DIAGNOSIS — Z7901 Long term (current) use of anticoagulants: Secondary | ICD-10-CM | POA: Diagnosis not present

## 2017-03-25 DIAGNOSIS — Z9981 Dependence on supplemental oxygen: Secondary | ICD-10-CM | POA: Diagnosis not present

## 2017-03-25 DIAGNOSIS — I428 Other cardiomyopathies: Secondary | ICD-10-CM | POA: Diagnosis not present

## 2017-03-25 LAB — BASIC METABOLIC PANEL
Anion gap: 9 (ref 5–15)
BUN: 27 mg/dL — AB (ref 6–20)
CHLORIDE: 102 mmol/L (ref 101–111)
CO2: 26 mmol/L (ref 22–32)
Calcium: 9.1 mg/dL (ref 8.9–10.3)
Creatinine, Ser: 1.27 mg/dL — ABNORMAL HIGH (ref 0.61–1.24)
GFR calc Af Amer: >60 mL/min (ref >60)
GFR calc non Af Amer: 59 mL/min — ABNORMAL LOW (ref >60)
GLUCOSE: 83 mg/dL (ref 65–99)
POTASSIUM: 4.3 mmol/L (ref 3.5–5.1)
Sodium: 137 mmol/L (ref 135–145)

## 2017-03-25 LAB — BRAIN NATRIURETIC PEPTIDE: B Natriuretic Peptide: 247.7 pg/mL — ABNORMAL HIGH (ref 0.0–100.0)

## 2017-03-25 MED ORDER — TAMSULOSIN HCL 0.4 MG PO CAPS: 0.8000 mg | ORAL_CAPSULE | Freq: Every day | ORAL | 0 refills | Status: DC

## 2017-03-25 MED ORDER — CARVEDILOL 6.25 MG PO TABS: 6.2500 mg | ORAL_TABLET | Freq: Two times a day (BID) | ORAL | 6 refills | Status: DC

## 2017-03-25 NOTE — Progress Notes (Signed)
Advanced Heart Failure Clinic Note   PCP: Dr. [name] EP: [name] HF Cardiology: [name]  61 yo with history of COPD on home oxygen, permanent atrial fibrillation, and chronic systolic CHF was referred by Dr. [name] for evaluation of CHF.  Amiodarone and DCCV were tried for atrial fibrillation in the past without success.   Patient has been noted to have a low EF since 2013.  EF improved by to normal by 2015 but dropped to 30-35% by 11/16.  Cath in 6/17 showed some CAD but not enough to cause cardiomyopathy.  He was admitted in 3/18 with PNA, atrial fibrillation/RVR, and acute on chronic systolic CHF.  Since that hospitalization, he has been wearing oxygen at all times.  Echo in 3/18 showed fall in EF to 10-15%.   He has struggled with dyspnea recently.  He had been on torsemide 60 mg bid and was taking metolazone daily for a number of days.  Metolazone was stopped due to rise in creatinine.  Dr. [name] took him for right and left heart cath in 4/18, which showed nonobstructive CAD and relatively optimized filling pressures.    He presents today for regular follow up. Seen end of last month for cough with sputum and received doxycycline and prednisone pack. Cough and breathing are better, but now lightheadedness with standing.  States he has no energy. Last two INRs have been high but denies bleeding, just bruising easily.  Taking extra torsemide every day for the past week . Has been able to feel his afib with palpitations.  Having headaches. Drinking > 5-6 bottles of water daily. Watching his salt.    Labs (4/18): pro-BNP 904, K 3.1, creatinine 1.39 => 1.08 with BUN 66 => 37, hgb 14.1.   R/LHC 01/28/17 Non-obstructive CAD => NICM RA mean 8 RV 36/10 PA 37/10, mean 22 PCWP mean 22 LV 106/9 AO 111/73  Oxygen saturations: PA 71% AO 100%  Cardiac Output (Fick) 5.76  Cardiac Index (Fick) 2.39  PMH: 1. Asthma 2. COPD: On home oxygen. Prior smoker.  3. Atrial fibrillation: Permanent.  He  failed DCCV and amiodarone in the past . 4. HTN 5. GERD 6. Hyperlipidemia 7. BPH.  8. Chronic systolic CHF: Nonischemic cardiomyopathy.  - EF 25% by TEE in 7/13.  Normalized on 2015 echo. - Echo (11/16): EF 30-35%.  - LHC/RHC (6/17): 80% ostial stenosis small D1, 30-40% pLAD.  Mean RA 4, PA 26/10, mean PCWP 11, CI 2.33.  - Echo (3/18): EF 10-15%, diffuse hypokinesis, severe LV dilation, moderate RV dilation with severely decreased systolic function.  - LHC/RHC (4/18): 40% proximal LAD.  Mean RA 8, PA 37/10 mean 22, mean PCWP 22, LVEDP 9, CI 2.39. 9. Morbid obesity 10. ILR in place.   Social History   Social History  . Marital status: Divorced    Spouse name: N/A  . Number of children: 2  . Years of education: N/A   Occupational History  . retired/disabled. prev worked in Therapist, sports. Disabled   Social History Main Topics  . Smoking status: Former Smoker    Packs/day: 2.00    Years: 30.00    Types: Cigarettes    Quit date: 10/16/2007  . Smokeless tobacco: Never Used  . Alcohol use No  . Drug use: No  . Sexual activity: Not Currently   Other Topics Concern  . Not on file   Social History Narrative   Lives alone.   Family History  Problem Relation Age of Onset  . COPD Mother   .  Asthma Mother   . Colon polyps Mother   . Allergies Mother   . Hypothyroidism Mother   . Asthma Maternal Grandmother   . Colon cancer Neg Hx    Review of systems complete and found to be negative unless listed in HPI.    Current Outpatient Prescriptions  Medication Sig Dispense Refill  . ADVAIR DISKUS 250-50 MCG/DOSE AEPB INHALE 1 PUFF EVERY DAY AS DIRECTED (Patient taking differently: INHALE 1 PUFF ONCE A DAY IF NEEDED SHORTNESS OF BREATH) 60 each 3  . albuterol (PROVENTIL HFA;VENTOLIN HFA) 108 (90 BASE) MCG/ACT inhaler Inhale 2 puffs into the lungs every 6 (six) hours as needed for wheezing or shortness of breath.     . ALPRAZolam (XANAX) 0.5 MG tablet TAKE 1 TO 2 TABLETS BY MOUTH  AT BEDTIME AS NEEDED SLEEP 180 tablet 1  . aspirin-acetaminophen-caffeine (EXCEDRIN MIGRAINE) 250-250-65 MG tablet     . carisoprodol (SOMA) 350 MG tablet TAKE 1 TABLET BY MOUTH 3 TIMES A DAY AS NEEDED FOR MUSCLE SPASMS 90 tablet 5  . carvedilol (COREG) 12.5 MG tablet TAKE 1 TABLET BY MOUTH TWICE A DAY WITH MEALS 60 tablet 5  . fluticasone (FLONASE) 50 MCG/ACT nasal spray Place 2 sprays into both nostrils daily as needed for allergies or rhinitis. 18.2 g 5  . hydrocortisone 2.5 % cream Apply 1 application topically at bedtime. itching     . levothyroxine (SYNTHROID, LEVOTHROID) 25 MCG tablet TAKE 1 TABLET BY MOUTH EVERY DAY BEFORE BREAKFAST 90 tablet 2  . lidocaine (LIDODERM) 5 % Apply 1 patch every 12 hrs to affected area. Do not use more than one patch in 24 hrs. 100 patch 1  . Linaclotide (LINZESS) 145 MCG CAPS capsule Take 145 mcg by mouth every other day.     . metolazone (ZAROXOLYN) 2.5 MG tablet Take 1 tablet (2.5 mg total) by mouth as directed. 5 tablet 1  . mometasone (ELOCON) 0.1 % ointment Apply sparingly to affected areas daily as needed 45 g 3  . niacin 500 MG tablet     . omeprazole (PRILOSEC) 20 MG capsule Take 1 capsule (20 mg total) by mouth daily with supper. 90 capsule 1  . potassium chloride SA (K-DUR,KLOR-CON) 20 MEQ tablet Take 2 tabs in AM and 1 tab in PM 270 tablet 2  . sertraline (ZOLOFT) 100 MG tablet TAKE 2 TABLETS BY MOUTH AT BEDTIME (Patient taking differently: TAKE 1 TABLETS BY MOUTH AT BEDTIME) 180 tablet 3  . silver sulfADIAZINE (SILVADENE) 1 % cream APPLY TO AFFECTED AREA TOPICALLY EVERY DAY 50 g 3  . spironolactone (ALDACTONE) 25 MG tablet Take 1 tablet (25 mg total) by mouth daily. 90 tablet 3  . tamsulosin (FLOMAX) 0.4 MG CAPS capsule Take 2 capsules (0.8 mg total) by mouth daily after breakfast. 60 capsule 0  . torsemide (DEMADEX) 20 MG tablet TAKE 3 TABLETS BY MOUTH TWICE A DAY 180 tablet 3  . TUMS 500 MG chewable tablet Chew 500-2,000 mg by mouth every 4  (four) hours as needed for indigestion or heartburn.     . VENTOLIN HFA 108 (90 Base) MCG/ACT inhaler INHALE 2 PUFFS 4 TIMES A DAY 18 Inhaler 0  . vitamin E 400 UNIT capsule     . warfarin (COUMADIN) 5 MG tablet Take as directed by anticoagulation clinic (Patient taking differently: Take 2.5-5 mg by mouth See admin instructions. 2.5mg  on Mondays and Fridays, 5mg  all other days) 30 tablet 3  . zolpidem (AMBIEN) 10 MG tablet TAKE 1  TABLET BY MOUTH AT BEDTIME 30 tablet 5   No current facility-administered medications for this encounter.    BP (!) 85/58 Comment: Standing 72 SBP  Pulse 65 Comment: irregular  Wt 282 lb 6 oz (128.1 kg)   SpO2 96%   BMI 44.23 kg/m    Wt Readings from Last 3 Encounters:  03/25/17 282 lb 6 oz (128.1 kg)  03/13/17 276 lb (125.2 kg)  03/05/17 285 lb (129.3 kg)    General: Fatigued appearing. NAD at rest.  HEENT: Normal Neck: Supple. JVP does not appear elevated.  Carotids 2+ bilat; no bruits. No thyromegaly or nodule noted. Cor: PMI nondisplaced. RRR, Trace to 1+ ankle edema. Lungs: Clear Abdomen: Obese, soft, non-tender, distended, no HSM. No bruits or masses. +BS  Extremities: no cyanosis, clubbing, rash, R and LLE no edema. Severe BLE venous varicosities.  Neuro: alert & orientedx3, cranial nerves grossly intact. moves all 4 extremities w/o difficulty. Affect flat  Assessment/Plan:  1. Cough/SOB - Resolved.  2. Atrial fibrillation: Permanent, good rate control.   - Continue Coreg and warfarin.  - INR high recently. Adjustments made per coumadin clinic.  3. COPD: No longer smoking.   - Wears 2.5 L 02 at home.  4. Chronic systolic CHF: Nonischemic cardiomyopathy based on 6/17 cath. However, he had a recent significant fall in EF from 30-35% to 10-15% on last echo from 3/18.  LHC/RHC in 4/18 showed nonobstructive CAD and relatively optimized filling pressures.   - Weight relatively stable.  - NYHA class III, but no energy. - Hypotensive - Decrease  coreg to 6.25 mg BID - He thinks he has been taking metoprolol but also goes back and forth referring to it as metolazone, which he had been taking daily.  NO metoprolol.  Hold metolazone for now.   - Continue spiro for now.  - Continue current torsemide 60 mg bid.   BMET today.  - Decrease coreg to 6.25 mg BID.  - Eventually, would like to add losartan versus Entresto per Dr. [name]. Pressure to soft today. - He qualifies for an ICD at this point with long-term low EF.  Nonischemic, so not as marked a benefit but age < 37.  Probably would not benefit much from AV nodal ablation/BiV pacing with controlled rate and RBBB.  4. CAD: Nonobstructive on 4/18 cath.  No exertional chest pain.  - He is on warfarin so no ASA.  - Reasonable to start statin in future with some CAD. Consider next visit.  5. Hypotension - Meds as above. Not orthostatic.   Very fatigued. Hypotensive.  Coreg decreased. Hold torsemide and coreg tonight.  Return next week to see Pharamacist. Needs to bring all of his meds. Follow up 2 weeks with Dr. [name]. Will have him see pharmacist and bring his pill bottles next week.    Shirley Friar, PA-C  03/25/2017   Greater than 50% of the 25 minute visit was spent in counseling/coordination of care regarding disease state education, medication reconciliation, salt/fluid restriction, and discussion of medical regimen with on site Pharm-D.

## 2017-03-25 NOTE — Patient Instructions (Signed)
DECREASE Carvedilol (Coreg) to 6.25 mg twice daily. Can half your current 12.5 mg tablets you have at home (Take 1/2 tablet twice daily). New Rx has been sent to your pharmacy for 6.25 mg tablets (Take 1 tab twice daily).  Routine lab work today. Will notify you of abnormal results, otherwise no news is good news!  STOP Metolazone.  STOP Metoprolol.  Follow up next week with Ileene Patrick to review medication management.  Follow up 2 weeks with Dr. Loralie Champagne.  Take all medication as prescribed the day of your appointment. Bring all medications with you to your appointment.  Do the following things EVERYDAY: 1) Weigh yourself in the morning before breakfast. Write it down and keep it in a log. 2) Take your medicines as prescribed 3) Eat low salt foods-Limit salt (sodium) to 2000 mg per day.  4) Stay as active as you can everyday 5) Limit all fluids for the day to less than 2 liters

## 2017-03-26 ENCOUNTER — Other Ambulatory Visit (HOSPITAL_COMMUNITY): Payer: Self-pay

## 2017-03-26 ENCOUNTER — Ambulatory Visit (INDEPENDENT_AMBULATORY_CARE_PROVIDER_SITE_OTHER): Payer: Medicare HMO | Admitting: *Deleted

## 2017-03-26 ENCOUNTER — Other Ambulatory Visit: Payer: Self-pay | Admitting: Internal Medicine

## 2017-03-26 DIAGNOSIS — R55 Syncope and collapse: Secondary | ICD-10-CM | POA: Diagnosis not present

## 2017-03-26 MED ORDER — CARVEDILOL 6.25 MG PO TABS: 6.2500 mg | ORAL_TABLET | Freq: Two times a day (BID) | ORAL | 6 refills | Status: DC

## 2017-03-27 NOTE — Progress Notes (Signed)
Carelink Summary Report / Loop Recorder 

## 2017-03-28 ENCOUNTER — Other Ambulatory Visit: Payer: Self-pay | Admitting: Pharmacist

## 2017-03-28 NOTE — Patient Outreach (Signed)
[facility]) Care Management  03/28/2017  [name] Jun 16, 1956 [id number]  Received a call from patient.  HIPAA details verified.  Patient reports he has concerns with affording his medications, namely his inhalers, Advair and albuterol.    Confirmed with patient he has partial [employer] applications Help at 25%.  Patient is wondering if he can apply to [name] ([place]) patient assistance---he reports he previously received medication for free from [place] patient assistance.    Counseled patient [facility] Pharmacist is unsure if [place] patient assistance will accept application given he has partial Extra Help from [facility].  Placed call to [place] patient assistance and was told by representative patient could apply to see if he is eligible.    Placed call back to patient---patient was made aware unsure if he will be eligible but he will need to provide documentation he spent at least $600 out-of-pocket on prescriptions in calendar year along with proof of household gross income.    Patient reports he would like to apply to see if he is able to get any assistance with inhalers (Advair and Ventolin).    Plan:  Will mail patient application for [place] patient assistance---address confirmed with patient in chart is correct per his report.   Patient to complete and return application so it can be submitted to [place] patient assistance.   Karrie Meres, PharmD, [place] [phone]

## 2017-03-29 ENCOUNTER — Encounter: Payer: Self-pay | Admitting: Pharmacist

## 2017-04-03 ENCOUNTER — Ambulatory Visit (HOSPITAL_COMMUNITY)
Admission: RE | Admit: 2017-04-03 | Discharge: 2017-04-03 | Disposition: A | Discharge status: Home / Self Care | Payer: Medicare HMO | Source: Ambulatory Visit | Attending: Internal Medicine | Admitting: Internal Medicine

## 2017-04-03 VITALS — BP 102/64 | HR 73 | Wt 282.0 lb

## 2017-04-03 DIAGNOSIS — I428 Other cardiomyopathies: Secondary | ICD-10-CM | POA: Insufficient documentation

## 2017-04-03 DIAGNOSIS — I482 Chronic atrial fibrillation: Secondary | ICD-10-CM | POA: Diagnosis not present

## 2017-04-03 DIAGNOSIS — I959 Hypotension, unspecified: Secondary | ICD-10-CM | POA: Insufficient documentation

## 2017-04-03 DIAGNOSIS — J449 Chronic obstructive pulmonary disease, unspecified: Secondary | ICD-10-CM | POA: Insufficient documentation

## 2017-04-03 DIAGNOSIS — I5022 Chronic systolic (congestive) heart failure: Secondary | ICD-10-CM | POA: Insufficient documentation

## 2017-04-03 DIAGNOSIS — I2510 Atherosclerotic heart disease of native coronary artery without angina pectoris: Secondary | ICD-10-CM | POA: Diagnosis not present

## 2017-04-03 LAB — BASIC METABOLIC PANEL
Anion gap: 7 (ref 5–15)
BUN: 22 mg/dL — AB (ref 6–20)
CHLORIDE: 105 mmol/L (ref 101–111)
CO2: 25 mmol/L (ref 22–32)
Calcium: 8.9 mg/dL (ref 8.9–10.3)
Creatinine, Ser: 1.3 mg/dL — ABNORMAL HIGH (ref 0.61–1.24)
GFR calc Af Amer: >60 mL/min (ref >60)
GFR, EST NON AFRICAN AMERICAN: 57 mL/min — AB (ref >60)
GLUCOSE: 81 mg/dL (ref 65–99)
POTASSIUM: 4 mmol/L (ref 3.5–5.1)
Sodium: 137 mmol/L (ref 135–145)

## 2017-04-03 LAB — BRAIN NATRIURETIC PEPTIDE: B Natriuretic Peptide: 293.9 pg/mL — ABNORMAL HIGH (ref 0.0–100.0)

## 2017-04-03 MED ORDER — LISINOPRIL 2.5 MG PO TABS: 2.5000 mg | ORAL_TABLET | Freq: Every day | ORAL | 5 refills | Status: DC

## 2017-04-03 NOTE — Progress Notes (Signed)
HF MD: [facility]  HPI: 61 yo Caucasian Male with history of COPD on home oxygen, permanent atrial fibrillation, and chronic systolic CHF was referred by Dr. [name] for evaluation of CHF.  Amiodarone and DCCV were tried for atrial fibrillation in the past without success.   Patient has been noted to have a low EF since 2013.  EF improved by to normal by 2015 but dropped to 30-35% by 11/16.  Cath in 6/17 showed some CAD but not enough to cause cardiomyopathy.  He was admitted in 3/18 with PNA, atrial fibrillation/RVR, and acute on chronic systolic CHF.  Since that hospitalization, he has been wearing oxygen at all times.  Echo in 3/18 showed fall in EF to 10-15%.   He has struggled with dyspnea recently.  He had been on torsemide 60 mg bid and was taking metolazone daily for a number of days.  Metolazone was stopped due to rise in creatinine.  Dr. [name] took him for right and left heart cath in 4/18, which showed nonobstructive CAD and relatively optimized filling pressures.    He presents today for pharmacist-led HF medication titration. At last HF clinic visit on 03/25/17, his carvedilol was to be decreased to 6.25 mg BID and metolazone with discontinued d/t low BP. He did bring all of his medication bottles with him today and it was noted that he did not decrease his carvedilol as instructed so he has still been taking 12.5 mg BID. States he has no energy and is constantly lightheaded but this is normal for him. He seems somewhat depressed and I have asked him to follow up about this with his PCP. He states that he has trouble affording some of his inhalers and levothyroxine so he may just stop taking them. He also reports only taking sertraline when he cries and I have advised him that this medication works better if he takes it on a daily basis. I have advised him not to do this and to reach out to his PCP for lower cost alternatives. Have also referred him to [place] [place] to address some of his concerns.  Still feels his afib consistently and continues to have frequent headaches. I did notice that he has some bruising in his legs and arms which he states is normal for him. I noticed that he is taking Vitamin E which can enhance the effects of warfarin so I have recommended that he try vitamin E cream instead for itching.     . Shortness of breath/dyspnea on exertion? Yes, but stable.   . Orthopnea/PND? Yes, reports sleeping in a chair for years, but stable, currently not using CPAP because uncomfortable  . Edema? Yes, trace edema in right leg stable since knee surgery  . Lightheadedness/dizziness? Yes, constant. If he gets up too quick it will get worse. Can feel constantly in Afib. No nitroglycerin.  . Daily weights at home? Yes, around 280 lbs.  . Blood pressure/heart rate monitoring at home? no . Following low-sodium/fluid-restricted diet? Reports trying to stay away from high salt diets.   HF Medications: Carvedilol 12.5 mg PO BID Metolazone 2.5 mg PO PRN weight gain/shortness of breath KCL 40 mEq QAM and 20 mEq QPM Torsemide 60 mg PO BID - needing to take 80 mg BID at least a few times per week for weight gain > 3 lb in 1 day Spironolactone 12.5 mg PO daily - none for the past 3 days  Has the patient been experiencing any side effects to the medications prescribed?  No  Does the patient have any problems obtaining medications due to transportation or finances?   Yes - as above, his inhalers and levothyroxine are costly for him; someone already working on assistance for inhalers  Understanding of regimen: good Understanding of indications: good Potential of compliance: good Patient understands to avoid NSAIDs. Patient understands to avoid decongestants.    Pertinent Lab Values: . 04/03/17: Serum creatinine 1.30 (baseline ~1), BUN 22, Potassium 4.0, Sodium 137, BNP 293 (around baseline)   Vital Signs: . Weight: 282 lbs (dry weight: 282 lbs) . Blood pressure: 102/64 mmHg . Heart  rate:  73 bpm  Assessment: 1. Chronicsystolic CHF (EF 09-81%), due to NICM. NYHA class IIIsymptoms. He had a recent significant fall in EF from 30-35% to 10-15% on last echo from 3/18.  LHC/RHC in 4/18 showed nonobstructive CAD and relatively optimized filling pressures.  NYHA class III symptoms. - Volume status stable - Discontinue spironolactone 12.5 mg QD as patient has not been taking for some time - Initiate lisinopril 2.5 mg QD  - Continue taking carvedilol, metolazone, torsemide and potassium chloride - BMET and BNP today - Basic disease state pathophysiology, medication indication, mechanism and side effects reviewed at length with patient and he verbalized understanding 2. Atrial fibrillation (permanent) - Continue Coreg and warfarin - Coumadin adjustments made per coumadin clinic 3. COPD - No longer smoking - Wears 2.5 L 02 at home  4. CAD (nonobstructive on 4/18 cath)   - No exertional chest pain.  - He is on warfarin so no ASA.  - Patient reports trying several statins and experiencing myalgias - not interested in retrying at this time 5. Hypotension - Meds as above.    Plan: 1) Medication changes: Based on clinical presentation, vital signs and recent labs will initiate lisinopril 2.5 mg QD and discontinue spironolactone 12.5 mg QD with non-compliance.  2) Labs: BMET and BNP today.  3) Follow-up: 04/11/2017 in HF Clinic with Dr. [name].   Patient seen with: Phillis Knack, PharmD Candidate  Agree with above. Ruta Hinds. Velva Harman, PharmD, BCPS, CPP Clinical Pharmacist Pager: [phone] Phone: [phone] 04/03/2017 2:13 PM

## 2017-04-03 NOTE — Patient Instructions (Addendum)
It was great to see you today!  Please START lisinopril 2.5 mg DAILY.  You can stay off of the spironolactone.   Labs today. We will call you with any abnormalities.  Please keep your appointment with Dr. [name] on 04/11/17.

## 2017-04-05 ENCOUNTER — Telehealth: Payer: Self-pay | Admitting: Licensed Clinical Social Worker

## 2017-04-05 ENCOUNTER — Telehealth (HOSPITAL_COMMUNITY): Payer: Self-pay | Admitting: Pharmacist

## 2017-04-05 ENCOUNTER — Other Ambulatory Visit: Payer: Self-pay | Admitting: Pharmacist

## 2017-04-05 ENCOUNTER — Ambulatory Visit (INDEPENDENT_AMBULATORY_CARE_PROVIDER_SITE_OTHER): Payer: Medicare HMO | Admitting: General Practice

## 2017-04-05 DIAGNOSIS — Z5181 Encounter for therapeutic drug level monitoring: Secondary | ICD-10-CM | POA: Diagnosis not present

## 2017-04-05 LAB — POCT INR: INR: 2.3

## 2017-04-05 NOTE — Telephone Encounter (Signed)
Patient called and left message on pharmacy line asking for help with his recent shipment from [facility]. Called patient back and left HIPAA compliant voicemail.   Carlean Jews, Pharm.D. PGY1 Pharmacy Resident 6/22/20188:32 AM Pager [phone]

## 2017-04-05 NOTE — Patient Instructions (Signed)
Pre visit review using our clinic review tool, if applicable. No additional management support is needed unless otherwise documented below in the visit note. 

## 2017-04-05 NOTE — Patient Outreach (Addendum)
[facility]) Care Management  04/05/2017  [name] 13-Sep-1956 [id number]  Received a message after hours from patient requesting update on patient assistance application.   Per review of chart, application was mailed to patient for [employer] Patient Assistance.    Returned call to patient, no answer, HIPAA compliant message left requesting return call.   Plan:  Will follow-up with patient next week if no return call.   Karrie Meres, PharmD, [place] [phone]  [phone]:   Patient returning [facility] Pharmacist call.  HIPAA details verified.  Advised patient [employer] application was mailed out to him, but it may not have gone out in mail until Monday.  Patient verified address in chart was correct mailing address.   Plan:  Patient counseled to call back early next week if he has not received application and a new one can be mailed out to him.    Karrie Meres, PharmD, [place] [phone]

## 2017-04-05 NOTE — Telephone Encounter (Signed)
CSW referred to provide supportive intervention due to depressive symptoms. CSW contacted patient who spoke at length about multiple stressors in his life. Patient states he has a prescription for Zoloft but doesn't take it consistently. Patient not interested in referral for counseling as he states "I can't afford another co-pay'. CSW provided supportive intervention and patient denies any sucidal ideation. CSW suggested referral to the paramedicine program for added support. Patient agreeable and CSW will refer. Patient verbalizes understanding and will call CSW if depressive symptoms decline. Raquel Sarna, [place], [place]

## 2017-04-05 NOTE — Telephone Encounter (Signed)
Patient called as he got a shipment of meds from his mail order pharmacy and he wanted to verify which meds he should be on. Patient specifically asks about tamsulosin and spironolactone. Per my chart review, spironolactone was discontinued 04/03/17, advised patient not to take. Tamsulosin was taken off med list but only because he had not been taking in 30 days, no documentation of discontinuation in notes. Advised patient OK to continue if symptomatic. Verified OK to continue with Dr. [name].   Carlean Jews, Pharm.D. PGY1 Pharmacy Resident 6/22/201811:03 AM Pager [phone]

## 2017-04-05 NOTE — Telephone Encounter (Signed)
CSW received referral to assist patient due to concerns of depression. CSW left message for return call for resources and support. Raquel Sarna, [place], [place]

## 2017-04-05 NOTE — Progress Notes (Signed)
I have reviewed and agree with the plan. 

## 2017-04-07 LAB — CUP PACEART REMOTE DEVICE CHECK
Date Time Interrogation Session: 20180612214009
MDC IDC PG IMPLANT DT: 20160622

## 2017-04-07 NOTE — Progress Notes (Signed)
Carelink summary report received. Battery status OK. Normal device function. No new tachy episodes, brady, or pause episodes. 6 symptom- 5 w/ ECGs appear: AF w/ RVR at times, PVCs noted w/ occasional artifact oversensing. 9 AF- 99.9% +warfarin. Monthly summary reports and ROV/PRN

## 2017-04-08 ENCOUNTER — Telehealth (HOSPITAL_COMMUNITY): Payer: Self-pay | Admitting: Surgery

## 2017-04-08 ENCOUNTER — Other Ambulatory Visit: Payer: Self-pay | Admitting: Pharmacist

## 2017-04-08 NOTE — Patient Outreach (Signed)
[facility]) Care Management  04/08/2017  [name] Mar 26, 1956 [id number]  Received message from patient requesting return call regarding [employer] Patient Assistance application.    Successful phone outreach to patient, HIPAA details verified.    Patient reports he received application and has questions about filling it out.  Discussed with patient which sections he needs to complete.  Discussed with patient [facility] Pharmacist will work with his prescriber to get prescriptions for albuterol inhaler and Advair to submit with application.   Patient reports he has proof of income and proof he spent over $600 in 2018 on prescriptions from [facility].   He is aware unsure if he will be eligible given he has partial Extra Help from [employer].    Patient called back and stated he was missing a page of the application.  Advised would mail it out to him---he called back again and reported he found the missing page.   Patient reports he will complete and mail back application to [name].   Plan:  Continue to follow-up with patient regarding application to [employer] patient assistance.   Karrie Meres, PharmD, [place] [phone]

## 2017-04-08 NOTE — Telephone Encounter (Signed)
Mr. Witherington has received a referral for HF Community Paramedicine program.  The appropriate paperwork has been sent via secure email to the paramedic team.

## 2017-04-10 ENCOUNTER — Other Ambulatory Visit (HOSPITAL_COMMUNITY): Payer: Medicare HMO

## 2017-04-11 ENCOUNTER — Encounter (HOSPITAL_COMMUNITY): Payer: Self-pay

## 2017-04-11 ENCOUNTER — Ambulatory Visit (HOSPITAL_COMMUNITY)
Admission: RE | Admit: 2017-04-11 | Discharge: 2017-04-11 | Disposition: A | Discharge status: Home / Self Care | Payer: Medicare HMO | Source: Ambulatory Visit | Attending: Cardiology | Admitting: Cardiology

## 2017-04-11 VITALS — BP 92/58 | HR 74 | Wt 268.5 lb

## 2017-04-11 DIAGNOSIS — K219 Gastro-esophageal reflux disease without esophagitis: Secondary | ICD-10-CM | POA: Diagnosis not present

## 2017-04-11 DIAGNOSIS — Z87891 Personal history of nicotine dependence: Secondary | ICD-10-CM | POA: Diagnosis not present

## 2017-04-11 DIAGNOSIS — I429 Cardiomyopathy, unspecified: Secondary | ICD-10-CM | POA: Diagnosis not present

## 2017-04-11 DIAGNOSIS — Z9981 Dependence on supplemental oxygen: Secondary | ICD-10-CM | POA: Insufficient documentation

## 2017-04-11 DIAGNOSIS — I428 Other cardiomyopathies: Secondary | ICD-10-CM

## 2017-04-11 DIAGNOSIS — E785 Hyperlipidemia, unspecified: Secondary | ICD-10-CM | POA: Insufficient documentation

## 2017-04-11 DIAGNOSIS — I5022 Chronic systolic (congestive) heart failure: Secondary | ICD-10-CM | POA: Insufficient documentation

## 2017-04-11 DIAGNOSIS — I482 Chronic atrial fibrillation: Secondary | ICD-10-CM | POA: Diagnosis not present

## 2017-04-11 DIAGNOSIS — I255 Ischemic cardiomyopathy: Secondary | ICD-10-CM | POA: Diagnosis not present

## 2017-04-11 DIAGNOSIS — I481 Persistent atrial fibrillation: Secondary | ICD-10-CM | POA: Diagnosis not present

## 2017-04-11 DIAGNOSIS — Z7901 Long term (current) use of anticoagulants: Secondary | ICD-10-CM | POA: Insufficient documentation

## 2017-04-11 DIAGNOSIS — J449 Chronic obstructive pulmonary disease, unspecified: Secondary | ICD-10-CM | POA: Insufficient documentation

## 2017-04-11 DIAGNOSIS — Z8371 Family history of colonic polyps: Secondary | ICD-10-CM | POA: Insufficient documentation

## 2017-04-11 DIAGNOSIS — M791 Myalgia: Secondary | ICD-10-CM | POA: Insufficient documentation

## 2017-04-11 DIAGNOSIS — I5032 Chronic diastolic (congestive) heart failure: Secondary | ICD-10-CM | POA: Diagnosis not present

## 2017-04-11 DIAGNOSIS — I2510 Atherosclerotic heart disease of native coronary artery without angina pectoris: Secondary | ICD-10-CM | POA: Diagnosis not present

## 2017-04-11 DIAGNOSIS — N400 Enlarged prostate without lower urinary tract symptoms: Secondary | ICD-10-CM | POA: Insufficient documentation

## 2017-04-11 DIAGNOSIS — I4819 Other persistent atrial fibrillation: Secondary | ICD-10-CM

## 2017-04-11 DIAGNOSIS — I110 Hypertensive heart disease with heart failure: Secondary | ICD-10-CM | POA: Diagnosis not present

## 2017-04-11 MED ORDER — TORSEMIDE 20 MG PO TABS: ORAL_TABLET | ORAL | 3 refills | Status: DC

## 2017-04-11 NOTE — Patient Instructions (Signed)
INCREASE Torsemide to 80 mg (4 Tablets) in the AM and 60 mg (3 Tablets) in the PM.  Labs in 10 days (BMET/BNP)  Follow up in 6 weeks

## 2017-04-12 DIAGNOSIS — G4733 Obstructive sleep apnea (adult) (pediatric): Secondary | ICD-10-CM | POA: Diagnosis not present

## 2017-04-12 NOTE — Progress Notes (Signed)
PCP: Dr. [name] EP: [name] HF Cardiology: [name]  61 yo with history of COPD on home oxygen, permanent atrial fibrillation, and chronic systolic CHF was referred by Dr. [name] for evaluation of CHF.  Amiodarone and DCCV were tried for atrial fibrillation in the past without success.   Patient has been noted to have a low EF since 2013.  EF improved by to normal by 2015 but dropped to 30-35% by 11/16.  Cath in 6/17 showed some CAD but not enough to cause cardiomyopathy.  He was admitted in 3/18 with PNA, atrial fibrillation/RVR, and acute on chronic systolic CHF.  Since that hospitalization, he has been wearing oxygen at all times.  Echo in 3/18 showed fall in EF to 10-15%.   I took him for right and left heart cath in 4/18, which showed nonobstructive CAD and relatively optimized filling pressures.    Patient is seen in followup today.  He continues to have generalized fatigue.  He has chronic exertional dyspnea after walking 50 or so yards.  He did ok walking into the office today.  Weight is down.  He continues to wear home oxygen for COPD.  No chest pain.  He has been sleeping in a recliner long-term.  Has OSA but cannot tolerate CPAP.  BP soft, SBP in 90s today.  He notes lightheadedness with standing, no falls/syncope.  He is on a low dose of prednisone chronically.  Says he was put on this for pruritis.   Labs (4/18): pro-BNP 904, K 3.1, creatinine 1.39 => 1.08 with BUN 66 => 37, hgb 14.1.  Labs (5/18): hemoglobin 13.1 Labs (6/18): K 4, creatinine 1.3, BNP 294  PMH: 1. Asthma 2. COPD: On home oxygen. Prior smoker.  3. Atrial fibrillation: Permanent.  He failed DCCV and amiodarone in the past . 4. HTN 5. GERD 6. Hyperlipidemia 7. BPH.  8. Chronic systolic CHF: Nonischemic cardiomyopathy.  - EF 25% by TEE in 7/13.  Normalized on 2015 echo. - Echo (11/16): EF 30-35%.  - LHC/RHC (6/17): 80% ostial stenosis small D1, 30-40% pLAD.  Mean RA 4, PA 26/10, mean PCWP 11, CI 2.33.  - Echo (3/18):  EF 10-15%, diffuse hypokinesis, severe LV dilation, moderate RV dilation with severely decreased systolic function.  - LHC/RHC (4/18): 40% proximal LAD.  Mean RA 8, PA 37/10 mean 22, mean PCWP 22, LVEDP 9, CI 2.39. 9. Morbid obesity 10. ILR in place.   Social History   Social History  . Marital status: Divorced    Spouse name: N/A  . Number of children: 2  . Years of education: N/A   Occupational History  . retired/disabled. prev worked in Therapist, sports. Disabled   Social History Main Topics  . Smoking status: Former Smoker    Packs/day: 2.00    Years: 30.00    Types: Cigarettes    Quit date: 10/16/2007  . Smokeless tobacco: Never Used  . Alcohol use No  . Drug use: No  . Sexual activity: Not Currently   Other Topics Concern  . Not on file   Social History Narrative   Lives alone.   Family History  Problem Relation Age of Onset  . COPD Mother   . Asthma Mother   . Colon polyps Mother   . Allergies Mother   . Hypothyroidism Mother   . Asthma Maternal Grandmother   . Colon cancer Neg Hx    ROS: All systems reviewed and negative except as per HPI.   Current Outpatient Prescriptions  Medication  Sig Dispense Refill  . ALPRAZolam (XANAX) 0.5 MG tablet TAKE 1 TO 2 TABLETS BY MOUTH AT BEDTIME AS NEEDED SLEEP 180 tablet 1  . aspirin-acetaminophen-caffeine (EXCEDRIN MIGRAINE) 250-250-65 MG tablet     . carisoprodol (SOMA) 350 MG tablet TAKE 1 TABLET BY MOUTH 3 TIMES A DAY AS NEEDED FOR MUSCLE SPASMS 90 tablet 5  . carvedilol (COREG) 12.5 MG tablet Take 12.5 mg by mouth 2 (two) times daily with a meal.    . fluticasone (FLONASE) 50 MCG/ACT nasal spray Place 2 sprays into both nostrils daily as needed for allergies or rhinitis. 18.2 g 5  . Fluticasone-Salmeterol (ADVAIR DISKUS) 250-50 MCG/DOSE AEPB Inhale 1 puff into the lungs daily.    . hydrocortisone 2.5 % cream Apply 1 application topically at bedtime. itching     . levothyroxine (SYNTHROID, LEVOTHROID) 25 MCG tablet  TAKE 1 TABLET BY MOUTH EVERY DAY BEFORE BREAKFAST 90 tablet 2  . lidocaine (LIDODERM) 5 % Apply 1 patch every 12 hrs to affected area. Do not use more than one patch in 24 hrs. 100 patch 1  . Linaclotide (LINZESS) 145 MCG CAPS capsule Take 145 mcg by mouth every other day.     . lisinopril (PRINIVIL,ZESTRIL) 2.5 MG tablet Take 1 tablet (2.5 mg total) by mouth daily. 30 tablet 5  . mometasone (ELOCON) 0.1 % ointment Apply sparingly to affected areas daily as needed 45 g 3  . niacin 500 MG tablet     . omeprazole (PRILOSEC) 20 MG capsule Take 1 capsule (20 mg total) by mouth daily with supper. 90 capsule 1  . potassium chloride SA (K-DUR,KLOR-CON) 20 MEQ tablet Take 2 tabs in AM and 1 tab in PM 270 tablet 2  . sertraline (ZOLOFT) 100 MG tablet TAKE 2 TABLETS BY MOUTH AT BEDTIME 180 tablet 3  . silver sulfADIAZINE (SILVADENE) 1 % cream APPLY TO AFFECTED AREA TOPICALLY EVERY DAY 50 g 3  . torsemide (DEMADEX) 20 MG tablet Take 80 mg (4 Tablets) in the AM and 60 mg (3 Tablets) in the PM. 210 tablet 3  . TUMS 500 MG chewable tablet Chew 500-2,000 mg by mouth every 4 (four) hours as needed for indigestion or heartburn.     . VENTOLIN HFA 108 (90 Base) MCG/ACT inhaler INHALE 2 PUFFS 4 TIMES A DAY 18 Inhaler 0  . vitamin E 400 UNIT capsule     . warfarin (COUMADIN) 5 MG tablet Take as directed by anticoagulation clinic 30 tablet 3  . zolpidem (AMBIEN) 10 MG tablet TAKE 1 TABLET BY MOUTH AT BEDTIME 30 tablet 5  . metolazone (ZAROXOLYN) 2.5 MG tablet Take 1 tablet (2.5 mg total) by mouth as directed. (Patient not taking: Reported on 04/03/2017) 5 tablet 1   No current facility-administered medications for this encounter.    BP (!) 92/58   Pulse 74   Wt 268 lb 8 oz (121.8 kg)   SpO2 99% Comment: on 2L of O2  BMI 42.05 kg/m  General: NAD, obese Neck: Thick, JVP 7-8 cm, no thyromegaly or thyroid nodule.  Lungs: Clear to auscultation bilaterally with normal respiratory effort. CV: Nondisplaced PMI.   Heart irregular S1/S2, no S3/S4, no murmur.  1+ edema 1/2 to knees bilaterally.  No carotid bruit.  Normal pedal pulses.  Abdomen: Soft, nontender, no hepatosplenomegaly, no distention.  Skin: Intact without lesions or rashes.  Neurologic: Alert and oriented x 3.  Psych: Normal affect. Extremities: No clubbing or cyanosis.  Bilateral lower leg venous varicosities.  HEENT:  Normal.   Assessment/Plan:  1. Atrial fibrillation: Permanent, good rate control.   - Continue Coreg and warfarin.  2. COPD: No longer smoking.  He is on home oxygen.   3. Chronic systolic CHF: Nonischemic cardiomyopathy based on 6/17 cath. However, he had a recent significant fall in EF from 30-35% to 10-15% on last echo from 3/18.  LHC/RHC in 4/18 showed nonobstructive CAD and relatively optimized filling pressures.  Weight is down but he still has prominent fatigue and exertional dyspnea.  On exam, he is probably mildly volume overloaded.  I think that COPD plays a prominent role in his symptoms.  He does not have much BP room for medication adjustment   - Spironolactone stopped with low BP.   - Increase torsemide to 80 qam/60 qpm.  BMET/BNP in 10 days.    - Continue Coreg.   - Continue low dose lisinopril in the evening.   - He qualifies for an ICD at this point with long-term low EF.  Nonischemic, so not as marked a benefit but age < 63.  Probably would not benefit much from AV nodal ablation/BiV pacing with controlled rate and RBBB.  3. COPD: Continue home oxygen.  4. CAD: Nonobstructive on 4/18 cath.  No exertional chest pain.  - He is on warfarin so no ASA.  - Myalgias with multiple statins, unable to tolerate.   Loralie Champagne 04/12/2017

## 2017-04-13 ENCOUNTER — Encounter: Payer: Self-pay | Admitting: Family Medicine

## 2017-04-13 ENCOUNTER — Ambulatory Visit (INDEPENDENT_AMBULATORY_CARE_PROVIDER_SITE_OTHER): Payer: Medicare HMO | Admitting: Family Medicine

## 2017-04-13 VITALS — BP 98/60 | HR 61 | Temp 97.6°F | Wt 285.8 lb

## 2017-04-13 DIAGNOSIS — L600 Ingrowing nail: Secondary | ICD-10-CM

## 2017-04-13 MED ORDER — AMOXICILLIN-POT CLAVULANATE 875-125 MG PO TABS: 1.0000 | ORAL_TABLET | Freq: Two times a day (BID) | ORAL | 0 refills | Status: DC

## 2017-04-13 MED ORDER — TRIAMCINOLONE ACETONIDE 0.1 % EX CREA: 1.0000 "application " | TOPICAL_CREAM | Freq: Three times a day (TID) | CUTANEOUS | 0 refills | Status: DC

## 2017-04-13 NOTE — Progress Notes (Signed)
[name] , 1956/09/29, 61 y.o., male MRN: [id number] Patient Care Team    Relationship Specialty Notifications Start End  [name], MD PCP - General   09/27/10    Comment: Merged  Himmelrich, Bryson Ha, RD (Inactive) Dietitian   03/11/12   Ruedinger, Drexel Iha, [facility] Care Management Pharmacist  03/28/17     Chief Complaint  Patient presents with  . Ingrown Toenail    very painful... Sx worsened x 2-3 days....denies noticing discharge....pt reports he has soaked     Subjective: Pt presents for an OV with complaints of Painful right great toenail of 2-3 days duration.  Associated symptoms include mild redness lateral nail fold . He denies fever, drainage, or swelling. He does admit that foot is a little larger and shoes fit tighter. His 2nd toe also puts pressure on the large toe.  Pt has tried to put abx ointment and keep wrapped to ease their symptoms.   Depression screen [facility] 2/9 01/14/2017 05/30/2016 11/15/2015 04/25/2015 04/11/2015  Decreased Interest 1 0 1 0 0  Down, Depressed, Hopeless 1 1 1  0 0  PHQ - 2 Score 2 1 2  0 0  Altered sleeping 2 - 3 - -  Tired, decreased energy 3 - 3 - -  Change in appetite 2 - 2 - -  Feeling bad or failure about yourself  2 - 1 - -  Trouble concentrating 0 - 1 - -  Moving slowly or fidgety/restless 0 - 1 - -  Suicidal thoughts 1 - 0 - -  PHQ-9 Score 12 - 13 - -  Difficult doing work/chores Somewhat difficult - Not difficult at all - -  Some recent data might be hidden    Allergies  Allergen Reactions  . Amiodarone Other (See Comments)    hyperthyroidism  . Statins Other (See Comments)    myalgia  . Tape Other (See Comments)    Skin Tears Use Paper Tape Only   Social History  Substance Use Topics  . Smoking status: Former Smoker    Packs/day: 2.00    Years: 30.00    Types: Cigarettes    Quit date: 10/16/2007  . Smokeless tobacco: Never Used  . Alcohol use No   Past Medical History:  Diagnosis Date  . Anemia     supposed to be taking Vit B but doesn't  . ANXIETY    takes Xanax nightly  . Arthritis   . ASTHMA    Albuterol prn and Advair daily;also takes Prednisone daily  . Asthma   . Atrial fibrillation ([place]) 09/06/2015  . Cardiomyopathy [facility])    a. EF 25% TEE July 2013; b. EF normalized 2015;  c. 03/2015 Echo: EF 40-45%, difrf HK, PASP 38 mmHg, Mild MR, sev LAE/RAE.  [name] Chronic constipation    takes OTC stool softener  . COPD (chronic obstructive pulmonary disease) ([place])   . DEPRESSION    takes Zoloft and Doxepin daily  . Diverticulitis   . DYSKINESIA, ESOPHAGUS   . Essential hypertension       . FIBROMYALGIA   . GERD (gastroesophageal reflux disease)       . Glaucoma   . HYPERLIPIDEMIA    a. Intolerant to statins.  . INSOMNIA    takes Ambien nightly  . Myocardial infarction [facility])    a. 2012 Myoview notable for prior infarct;  b. 03/2015 Lexiscan CL: EF 37%, diff HK, small area of inferior infarct from apex to base-->Med Rx.  [name]  Myocardial infarction ([place])   . O2 dependent    uses oxygen 2 and 1/2 liters per [place] at hs  . Paroxysmal atrial fibrillation ([facility])    a. CHA2DS2VASc = 3--> takes Coumadin;  b. 03/15/2015 Successful TEE/DCCV;  c. 03/2015 recurrent afib, Amio d/c'd in setting of hyperthyroidism.  . Peripheral neuropathy   . Pneumonia 12/2016  . Rash and other nonspecific skin eruption 04/12/2009   no cause found saw dermatologists x 2 and allergist  . SLEEP APNEA, OBSTRUCTIVE    a. doesn't use CPAP  . Syncope    a. 03/2015 s/p MDT LINQ.  [name] Type II diabetes mellitus ([place])        Past Surgical History:  Procedure Laterality Date  . ACNE CYST REMOVAL     2 on back   . ADENOIDECTOMY    . CARDIAC CATHETERIZATION N/A 03/21/2016   Procedure: Right/Left Heart Cath and Coronary Angiography;  Surgeon: Larey Dresser, MD;  Location: [place] CV LAB;  Service: Cardiovascular;  Laterality: N/A;  . CARDIOVERSION  04/18/2012   Procedure: CARDIOVERSION;  Surgeon: Fay Records, MD;   Location: [place];  Service: Cardiovascular;  Laterality: N/A;  . CARDIOVERSION  04/25/2012   Procedure: CARDIOVERSION;  Surgeon: Thayer Headings, MD;  Location: [place];  Service: Cardiovascular;  Laterality: N/A;  . CARDIOVERSION  04/25/2012   Procedure: CARDIOVERSION;  Surgeon: Fay Records, MD;  Location: [place];  Service: Cardiovascular;  Laterality: N/A;  . CARDIOVERSION  05/09/2012   Procedure: CARDIOVERSION;  Surgeon: Sherren Mocha, MD;  Location: [place];  Service: Cardiovascular;  Laterality: N/A;  changed from crenshaw to cooper by trish/leone-endo  . CARDIOVERSION N/A 03/15/2015   Procedure: CARDIOVERSION;  Surgeon: Thayer Headings, MD;  Location: [facility] END[facility]COPY;  Service: Cardiovascular;  Laterality: N/A;  . COLONOSCOPY    . COLONOSCOPY WITH PROPOFOL N/A 10/21/2014   Procedure: COLONOSCOPY WITH PROPOFOL;  Surgeon: Ladene Artist, MD;  Location: [facility] END[facility]COPY;  Service: Endoscopy;  Laterality: N/A;  . EP IMPLANTABLE DEVICE N/A 04/06/2015   Procedure: Loop Recorder Insertion;  Surgeon: Evans Lance, MD;  Location: [place] CV LAB;  Service: Cardiovascular;  Laterality: N/A;  . ESOPHAGOGASTRODUODENOSCOPY    . RIGHT/LEFT HEART CATH AND CORONARY ANGIOGRAPHY N/A 01/28/2017   Procedure: Right/Left Heart Cath and Coronary Angiography;  Surgeon: Larey Dresser, MD;  Location: [place] CV LAB;  Service: Cardiovascular;  Laterality: N/A;  . TEE WITHOUT CARDIOVERSION  04/25/2012   Procedure: TRANSESOPHAGEAL ECHOCARDIOGRAM (TEE);  Surgeon: Thayer Headings, MD;  Location: [place];  Service: Cardiovascular;  Laterality: N/A;  . TEE WITHOUT CARDIOVERSION N/A 03/15/2015   Procedure: TRANSESOPHAGEAL ECHOCARDIOGRAM (TEE);  Surgeon: Thayer Headings, MD;  Location: [place];  Service: Cardiovascular;  Laterality: N/A;  . TONSILLECTOMY    . TOTAL KNEE ARTHROPLASTY Right 06/15/2014   Procedure: TOTAL KNEE ARTHROPLASTY;  Surgeon: Renette Butters, MD;  Location: [place];  Service: Orthopedics;   Laterality: Right;   Family History  Problem Relation Age of Onset  . COPD Mother   . Asthma Mother   . Colon polyps Mother   . Allergies Mother   . Hypothyroidism Mother   . Asthma Maternal Grandmother   . Colon cancer Neg Hx    Allergies as of 04/13/2017      Reactions   Amiodarone Other (See Comments)   hyperthyroidism   Statins Other (See Comments)   myalgia   Tape Other (See Comments)   Skin Tears Use Paper  Tape Only      Medication List       Accurate as of 04/13/17 10:17 AM. Always use your most recent med list.          ADVAIR DISKUS 250-50 MCG/DOSE Aepb Generic drug:  Fluticasone-Salmeterol Inhale 1 puff into the lungs daily.   ALPRAZolam 0.5 MG tablet Commonly known as:  XANAX TAKE 1 TO 2 TABLETS BY MOUTH AT BEDTIME AS NEEDED SLEEP   amoxicillin-clavulanate 875-125 MG tablet Commonly known as:  AUGMENTIN Take 1 tablet by mouth 2 (two) times daily.   aspirin-acetaminophen-caffeine 250-250-65 MG tablet Commonly known as:  EXCEDRIN MIGRAINE   carisoprodol 350 MG tablet Commonly known as:  SOMA TAKE 1 TABLET BY MOUTH 3 TIMES A DAY AS NEEDED FOR MUSCLE SPASMS   carvedilol 12.5 MG tablet Commonly known as:  COREG Take 12.5 mg by mouth 2 (two) times daily with a meal.   fluticasone 50 MCG/ACT nasal spray Commonly known as:  FLONASE Place 2 sprays into both nostrils daily as needed for allergies or rhinitis.   hydrocortisone 2.5 % cream Apply 1 application topically at bedtime. itching   levothyroxine 25 MCG tablet Commonly known as:  SYNTHROID, LEVOTHROID TAKE 1 TABLET BY MOUTH EVERY DAY BEFORE BREAKFAST   lidocaine 5 % Commonly known as:  LIDODERM Apply 1 patch every 12 hrs to affected area. Do not use more than one patch in 24 hrs.   linaclotide 145 MCG Caps capsule Commonly known as:  LINZESS Take 145 mcg by mouth every other day.   lisinopril 2.5 MG tablet Commonly known as:  PRINIVIL,ZESTRIL Take 1 tablet (2.5 mg total) by mouth daily.    metolazone 2.5 MG tablet Commonly known as:  ZAROXOLYN Take 1 tablet (2.5 mg total) by mouth as directed.   mometasone 0.1 % ointment Commonly known as:  ELOCON Apply sparingly to affected areas daily as needed   niacin 500 MG tablet   omeprazole 20 MG capsule Commonly known as:  PRILOSEC Take 1 capsule (20 mg total) by mouth daily with supper.   potassium chloride SA 20 MEQ tablet Commonly known as:  K-DUR,KLOR-CON Take 2 tabs in AM and 1 tab in PM   sertraline 100 MG tablet Commonly known as:  ZOLOFT TAKE 2 TABLETS BY MOUTH AT BEDTIME   silver sulfADIAZINE 1 % cream Commonly known as:  SILVADENE APPLY TO AFFECTED AREA TOPICALLY EVERY DAY   torsemide 20 MG tablet Commonly known as:  DEMADEX Take 80 mg (4 Tablets) in the AM and 60 mg (3 Tablets) in the PM.   triamcinolone cream 0.1 % Commonly known as:  KENALOG Apply 1 application topically 3 (three) times daily. Apply to nail folds.   TUMS 500 MG chewable tablet Generic drug:  calcium carbonate Chew 500-2,000 mg by mouth every 4 (four) hours as needed for indigestion or heartburn.   VENTOLIN HFA 108 (90 Base) MCG/ACT inhaler Generic drug:  albuterol INHALE 2 PUFFS 4 TIMES A DAY   vitamin E 400 UNIT capsule   warfarin 5 MG tablet Commonly known as:  COUMADIN Take as directed by anticoagulation clinic   zolpidem 10 MG tablet Commonly known as:  AMBIEN TAKE 1 TABLET BY MOUTH AT BEDTIME       All past medical history, surgical history, allergies, family history, immunizations andmedications were updated in the EMR today and reviewed under the history and medication portions of their EMR.     ROS: Negative, with the exception of above mentioned in HPI  Objective:  BP 98/60   Pulse 61   Temp 97.6 F (36.4 C) (Oral)   Wt 285 lb 12 oz (129.6 kg)   SpO2 97%   BMI 44.75 kg/m  Body mass index is 44.75 kg/m. Gen: Afebrile. No acute distress. Nontoxic in appearance, well developed, well nourished. Obese  male.  Skin: No rashes, purpura or petechiae. Right large toe bilateral nail folds curved into nailbed. Very mild erythema right lateral nailbed. No drainage. No swelling. Good cap refill.     No exam data present No results found. No results found. However, due to the size of the patient record, not all encounters were searched. Please check Results Review for a complete set of results.  Assessment/Plan: [name] is a 61 y.o. male present for OV for  Ingrown toenail - Pt will need to see a podiatrist to obtain adequate treatment of his ingrown nail. He also could benefit with treatment of other thickened nails. Pt was educated on treatment options for ingrown nails. Epson soak TID with steroid cream application after. Augmentin for possible mild infection. NSAIDS for pain.  -keep covered, wear open toed shoes if possible until podiatry is able to treat appropriately.  -  Ambulatory referral to Podiatry will need nail wedges on bilateral nail folds to correct.  - triamcinolone cream (KENALOG) 0.1 %; Apply 1 application topically 3 (three) times daily. Apply to nail folds.  Dispense: 30 g; Refill: 0 - amoxicillin-clavulanate (AUGMENTIN) 875-125 MG tablet; Take 1 tablet by mouth 2 (two) times daily.  Dispense: 14 tablet; Refill: 0 F/U PCP 1 week if not evaluate by podiatry prior   Reviewed expectations re: course of current medical issues.  Discussed self-management of symptoms.  Outlined signs and symptoms indicating need for more acute intervention.  Patient verbalized understanding and all questions were answered.  Patient received an After-Visit Summary.    Orders Placed This Encounter  Procedures  . Ambulatory referral to Podiatry     Note is dictated utilizing voice recognition software. Although note has been proof read prior to signing, occasional typographical errors still can be missed. If any questions arise, please do not hesitate to call for verification.    electronically signed by:  Howard Pouch, DO  [place]

## 2017-04-13 NOTE — Patient Instructions (Addendum)
I will place a referral to [facility], they will call you to schedule this week. I have called in an antibiotic to take by mouth. Soak toe in Epson salt/warm water every 8 hours then apply cream. Cover.    Ingrown Toenail An ingrown toenail occurs when the corner or sides of your toenail grow into the surrounding skin. The big toe is most commonly affected, but it can happen to any of your toes. If your ingrown toenail is not treated, you will be at risk for infection. What are the causes? This condition may be caused by:  Wearing shoes that are too small or tight.  Injury or trauma, such as stubbing your toe or having your toe stepped on.  Improper cutting or care of your toenails.  Being born with (congenital) nail or foot abnormalities, such as having a nail that is too big for your toe.  What increases the risk? Risk factors for an ingrown toenail include:  Age. Your nails tend to thicken as you get older, so ingrown nails are more common in older people.  Diabetes.  Cutting your toenails incorrectly.  Blood circulation problems.  What are the signs or symptoms? Symptoms may include:  Pain, soreness, or tenderness.  Redness.  Swelling.  Hardening of the skin surrounding the toe.  Your ingrown toenail may be infected if there is fluid, pus, or drainage. How is this diagnosed? An ingrown toenail may be diagnosed by medical history and physical exam. If your toenail is infected, your health care provider may test a sample of the drainage. How is this treated? Treatment depends on the severity of your ingrown toenail. Some ingrown toenails may be treated at home. More severe or infected ingrown toenails may require surgery to remove all or part of the nail. Infected ingrown toenails may also be treated with antibiotic medicines. Follow these instructions at home:  If you were prescribed an antibiotic medicine, finish all of it even if you start to feel  better.  Soak your foot in warm soapy water for 20 minutes, 3 times per day or as directed by your health care provider.  Carefully lift the edge of the nail away from the sore skin by wedging a small piece of cotton under the corner of the nail. This may help with the pain. Be careful not to cause more injury to the area.  Wear shoes that fit well. If your ingrown toenail is causing you pain, try wearing sandals, if possible.  Trim your toenails regularly and carefully. Do not cut them in a curved shape. Cut your toenails straight across. This prevents injury to the skin at the corners of the toenail.  Keep your feet clean and dry.  If you are having trouble walking and are given crutches by your health care provider, use them as directed.  Do not pick at your toenail or try to remove it yourself.  Take medicines only as directed by your health care provider.  Keep all follow-up visits as directed by your health care provider. This is important. Contact a health care provider if:  Your symptoms do not improve with treatment. Get help right away if:  You have red streaks that start at your foot and go up your leg.  You have a fever.  You have increased redness, swelling, or pain.  You have fluid, blood, or pus coming from your toenail. This information is not intended to replace advice given to you by your health care provider.  Make sure you discuss any questions you have with your health care provider. Document Released: 09/28/2000 Document Revised: 03/02/2016 Document Reviewed: 08/25/2014 Elsevier Interactive Patient Education  [employer].

## 2017-04-15 ENCOUNTER — Other Ambulatory Visit: Payer: Self-pay | Admitting: Internal Medicine

## 2017-04-15 DIAGNOSIS — I428 Other cardiomyopathies: Secondary | ICD-10-CM

## 2017-04-15 DIAGNOSIS — I4819 Other persistent atrial fibrillation: Secondary | ICD-10-CM

## 2017-04-15 DIAGNOSIS — I255 Ischemic cardiomyopathy: Secondary | ICD-10-CM

## 2017-04-16 NOTE — Telephone Encounter (Signed)
Done hardcopy to [place]  

## 2017-04-16 NOTE — Telephone Encounter (Signed)
Faxed

## 2017-04-18 ENCOUNTER — Telehealth: Payer: Self-pay

## 2017-04-18 ENCOUNTER — Other Ambulatory Visit (INDEPENDENT_AMBULATORY_CARE_PROVIDER_SITE_OTHER): Payer: Medicare HMO

## 2017-04-18 ENCOUNTER — Telehealth: Payer: Self-pay | Admitting: Cardiology

## 2017-04-18 ENCOUNTER — Ambulatory Visit (INDEPENDENT_AMBULATORY_CARE_PROVIDER_SITE_OTHER): Payer: Medicare HMO | Admitting: Internal Medicine

## 2017-04-18 ENCOUNTER — Encounter: Payer: Self-pay | Admitting: Internal Medicine

## 2017-04-18 VITALS — BP 96/60 | HR 67 | Ht 67.0 in | Wt 277.0 lb

## 2017-04-18 DIAGNOSIS — Z0001 Encounter for general adult medical examination with abnormal findings: Secondary | ICD-10-CM | POA: Diagnosis not present

## 2017-04-18 DIAGNOSIS — G4700 Insomnia, unspecified: Secondary | ICD-10-CM | POA: Diagnosis not present

## 2017-04-18 DIAGNOSIS — H1010 Acute atopic conjunctivitis, unspecified eye: Secondary | ICD-10-CM | POA: Insufficient documentation

## 2017-04-18 DIAGNOSIS — H4089 Other specified glaucoma: Secondary | ICD-10-CM | POA: Diagnosis not present

## 2017-04-18 DIAGNOSIS — H409 Unspecified glaucoma: Secondary | ICD-10-CM | POA: Insufficient documentation

## 2017-04-18 DIAGNOSIS — E119 Type 2 diabetes mellitus without complications: Secondary | ICD-10-CM | POA: Diagnosis not present

## 2017-04-18 DIAGNOSIS — L600 Ingrowing nail: Secondary | ICD-10-CM

## 2017-04-18 DIAGNOSIS — R21 Rash and other nonspecific skin eruption: Secondary | ICD-10-CM

## 2017-04-18 DIAGNOSIS — I5033 Acute on chronic diastolic (congestive) heart failure: Secondary | ICD-10-CM | POA: Diagnosis not present

## 2017-04-18 DIAGNOSIS — H1012 Acute atopic conjunctivitis, left eye: Secondary | ICD-10-CM

## 2017-04-18 LAB — URINALYSIS, ROUTINE W REFLEX MICROSCOPIC
BILIRUBIN URINE: NEGATIVE
HGB URINE DIPSTICK: NEGATIVE
Ketones, ur: NEGATIVE
Leukocytes, UA: NEGATIVE
NITRITE: NEGATIVE
RBC / HPF: NONE SEEN (ref 0–?)
Specific Gravity, Urine: 1.01 (ref 1.000–1.030)
Total Protein, Urine: NEGATIVE
Urine Glucose: NEGATIVE
Urobilinogen, UA: 0.2 (ref 0.0–1.0)
pH: 5.5 (ref 5.0–8.0)

## 2017-04-18 LAB — CBC WITH DIFFERENTIAL/PLATELET
BASOS PCT: 0.6 % (ref 0.0–3.0)
Basophils Absolute: 0 10*3/uL (ref 0.0–0.1)
EOS PCT: 1.8 % (ref 0.0–5.0)
Eosinophils Absolute: 0.2 10*3/uL (ref 0.0–0.7)
HEMATOCRIT: 38.8 % — AB (ref 39.0–52.0)
Hemoglobin: 13.2 g/dL (ref 13.0–17.0)
Lymphocytes Relative: 23.3 % (ref 12.0–46.0)
Lymphs Abs: 1.9 10*3/uL (ref 0.7–4.0)
MCHC: 34.1 g/dL (ref 30.0–36.0)
MCV: 93.7 fl (ref 78.0–100.0)
MONO ABS: 0.8 10*3/uL (ref 0.1–1.0)
Monocytes Relative: 10 % (ref 3.0–12.0)
NEUTROS ABS: 5.4 10*3/uL (ref 1.4–7.7)
Neutrophils Relative %: 64.3 % (ref 43.0–77.0)
PLATELETS: 175 10*3/uL (ref 150.0–400.0)
RBC: 4.14 Mil/uL — ABNORMAL LOW (ref 4.22–5.81)
RDW: 17.8 % — AB (ref 11.5–15.5)
WBC: 8.3 10*3/uL (ref 4.0–10.5)

## 2017-04-18 LAB — HEPATIC FUNCTION PANEL
ALT: 17 U/L (ref 0–53)
AST: 22 U/L (ref 0–37)
Albumin: 4.2 g/dL (ref 3.5–5.2)
Alkaline Phosphatase: 56 U/L (ref 39–117)
BILIRUBIN DIRECT: 0.1 mg/dL (ref 0.0–0.3)
BILIRUBIN TOTAL: 0.7 mg/dL (ref 0.2–1.2)
Total Protein: 6.6 g/dL (ref 6.0–8.3)

## 2017-04-18 LAB — LIPID PANEL
Cholesterol: 159 mg/dL (ref 0–200)
HDL: 41.8 mg/dL (ref >39.00)
LDL Cholesterol: 96 mg/dL (ref 0–99)
NONHDL: 117.02
Total CHOL/HDL Ratio: 4
Triglycerides: 104 mg/dL (ref 0.0–149.0)
VLDL: 20.8 mg/dL (ref 0.0–40.0)

## 2017-04-18 LAB — TSH: TSH: 3.28 u[IU]/mL (ref 0.35–4.50)

## 2017-04-18 LAB — BASIC METABOLIC PANEL
BUN: 25 mg/dL — AB (ref 6–23)
CHLORIDE: 106 meq/L (ref 96–112)
CO2: 24 mEq/L (ref 19–32)
Calcium: 9 mg/dL (ref 8.4–10.5)
Creatinine, Ser: 1.38 mg/dL (ref 0.40–1.50)
GFR: 55.43 mL/min — AB (ref >60.00)
Glucose, Bld: 93 mg/dL (ref 70–99)
POTASSIUM: 4.4 meq/L (ref 3.5–5.1)
SODIUM: 138 meq/L (ref 135–145)

## 2017-04-18 LAB — MICROALBUMIN / CREATININE URINE RATIO
CREATININE, U: 119 mg/dL
MICROALB UR: 3 mg/dL — AB (ref 0.0–1.9)
MICROALB/CREAT RATIO: 2.5 mg/g (ref 0.0–30.0)

## 2017-04-18 LAB — HEMOGLOBIN A1C: HEMOGLOBIN A1C: 5.8 % (ref 4.6–6.5)

## 2017-04-18 LAB — PSA: PSA: 3.29 ng/mL (ref 0.10–4.00)

## 2017-04-18 MED ORDER — KETOCONAZOLE 2 % EX CREA: 1.0000 "application " | TOPICAL_CREAM | Freq: Every day | CUTANEOUS | 0 refills | Status: DC

## 2017-04-18 MED ORDER — LINACLOTIDE 145 MCG PO CAPS: 145.0000 ug | ORAL_CAPSULE | ORAL | 11 refills | Status: DC

## 2017-04-18 MED ORDER — TRAZODONE HCL 100 MG PO TABS: 50.0000 mg | ORAL_TABLET | Freq: Every evening | ORAL | 1 refills | Status: DC | PRN

## 2017-04-18 NOTE — Assessment & Plan Note (Signed)
[place] for restart trazodone qhs prn

## 2017-04-18 NOTE — Assessment & Plan Note (Signed)
stable overall by history and exam, recent data reviewed with pt, and pt to continue medical treatment as before,  to f/u any worsening symptoms or concerns Lab Results  Component Value Date   HGBA1C 6.0 12/04/2016

## 2017-04-18 NOTE — Assessment & Plan Note (Signed)
Ok for zaditor otc prn,  to f/u any worsening symptoms or concerns

## 2017-04-18 NOTE — Assessment & Plan Note (Signed)
[place] for cont'd augmentin use, delcines podiatry due to cost

## 2017-04-18 NOTE — Assessment & Plan Note (Signed)
Overall doing well, age appropriate education and counseling updated, referrals for preventative services and immunizations addressed, dietary and smoking counseling addressed, most recent labs reviewed.  I have personally reviewed and have noted:  1) the patient's medical and social history 2) The pt's use of alcohol, tobacco, and illicit drugs 3) The patient's current medications and supplements 4) Functional ability including ADL's, fall risk, home safety risk, hearing and visual impairment 5) Diet and physical activities 6) Evidence for depression or mood disorder 7) The patient's height, weight, and BMI have been recorded in the chart  I have made referrals, and provided counseling and education based on review of the above  

## 2017-04-18 NOTE — Telephone Encounter (Signed)
LMOVM requesting that pt send manual transmission b/c home monitor has not updated in at least 14 days.    

## 2017-04-18 NOTE — Assessment & Plan Note (Signed)
C/w fungal not better with powder, for ketoconozole cr asd prn,  to f/u any worsening symptoms or concerns

## 2017-04-18 NOTE — Assessment & Plan Note (Signed)
Encouraged to f/u with optho, but declines due to cost for now

## 2017-04-18 NOTE — Progress Notes (Signed)
Subjective:    Patient ID: [name], male    DOB: [birth year], 61 y.o.   MRN: [id number]  HPI  Here for wellness and f/u;  Overall doing ok;  Pt denies Chest pain, worsening SOB, DOE, wheezing, orthopnea, PND, palpitations, dizziness or syncope.  Pt denies neurological change such as new headache, facial or extremity weakness., but has chronic distal numbness due to neuropathy  Pt denies polydipsia, polyuria, or low sugar symptoms. Pt states overall good compliance with treatment and medications, good tolerability, urinates a lot due to diuretics, and has been trying to follow appropriate diet.  Pt states good ability with ADL's, has low fall risk, home safety reviewed and adequate, no other significant changes in hearing or vision, active with exercise.    Pt has had mild worsening depressive symptoms, suicidal ideation or panic. No fever, night sweats, wt loss, loss of appetite, or other constitutional symptoms.  Now back on zoloft and trazodone qhs, has tried to do without due to limited funds, but needs to restart Wt Readings from Last 3 Encounters:  04/18/17 277 lb (125.6 kg)  04/13/17 285 lb 12 oz (129.6 kg)  04/11/17 268 lb 8 oz (121.8 kg)   BP Readings from Last 3 Encounters:  04/18/17 96/60  04/13/17 98/60  04/11/17 (!) 92/58  Rash to left lower pannus near the groin worse in the past 1 -2 wks, mild, but antifungal powder not working well. Left eye with matting and occasional feeling of foreign body for several wks without worsening pain or fever Currently taking augmentin for ingrown nail, tolerating well without diarrhea , to improved, declines podiatry due to cost Also needs BNP with labs per cardiology request today Due for glaucoma f/u but will cost $300 and cannot afford. Past Medical History:  Diagnosis Date  . Anemia    supposed to be taking Vit B but doesn't  . ANXIETY    takes Xanax nightly  . Arthritis   . ASTHMA    Albuterol prn and Advair daily;also takes  Prednisone daily  . Asthma   . Atrial fibrillation ([place]) 09/06/2015  . Cardiomyopathy [facility])    a. EF 25% TEE July 2013; b. EF normalized 2015;  c. 03/2015 Echo: EF 40-45%, difrf HK, PASP 38 mmHg, Mild MR, sev LAE/RAE.  [name] Chronic constipation    takes OTC stool softener  . COPD (chronic obstructive pulmonary disease) ([place])   . DEPRESSION    takes Zoloft and Doxepin daily  . Diverticulitis   . DYSKINESIA, ESOPHAGUS   . Essential hypertension       . FIBROMYALGIA   . GERD (gastroesophageal reflux disease)       . Glaucoma   . HYPERLIPIDEMIA    a. Intolerant to statins.  . INSOMNIA    takes Ambien nightly  . Myocardial infarction [facility])    a. 2012 Myoview notable for prior infarct;  b. 03/2015 Lexiscan CL: EF 37%, diff HK, small area of inferior infarct from apex to base-->Med Rx.  . Myocardial infarction ([place])   . O2 dependent    uses oxygen 2 and 1/2 liters per [place] at hs  . Paroxysmal atrial fibrillation (H[facility])    a. CHA2DS2VASc = 3--> takes Coumadin;  b. 03/15/2015 Successful TEE/DCCV;  c. 03/2015 recurrent afib, Amio d/c'd in setting of hyperthyroidism.  . Peripheral neuropathy   . Pneumonia 12/2016  . Rash and other nonspecific skin eruption 04/12/2009   no cause found saw dermatologists x 2 and allergist  .  SLEEP APNEA, OBSTRUCTIVE    a. doesn't use CPAP  . Syncope    a. 03/2015 s/p MDT LINQ.  [name] Type II diabetes mellitus ([place])        Past Surgical History:  Procedure Laterality Date  . ACNE CYST REMOVAL     2 on back   . ADENOIDECTOMY    . CARDIAC CATHETERIZATION N/A 03/21/2016   Procedure: Right/Left Heart Cath and Coronary Angiography;  Surgeon: Larey Dresser, MD;  Location: [place] CV LAB;  Service: Cardiovascular;  Laterality: N/A;  . CARDIOVERSION  04/18/2012   Procedure: CARDIOVERSION;  Surgeon: Fay Records, MD;  Location: [place];  Service: Cardiovascular;  Laterality: N/A;  . CARDIOVERSION  04/25/2012   Procedure: CARDIOVERSION;  Surgeon: Thayer Headings, MD;   Location: [place];  Service: Cardiovascular;  Laterality: N/A;  . CARDIOVERSION  04/25/2012   Procedure: CARDIOVERSION;  Surgeon: Fay Records, MD;  Location: [place];  Service: Cardiovascular;  Laterality: N/A;  . CARDIOVERSION  05/09/2012   Procedure: CARDIOVERSION;  Surgeon: Sherren Mocha, MD;  Location: [place];  Service: Cardiovascular;  Laterality: N/A;  changed from crenshaw to cooper by trish/leone-endo  . CARDIOVERSION N/A 03/15/2015   Procedure: CARDIOVERSION;  Surgeon: Thayer Headings, MD;  Location: [facility] ENDOSCOPY;  Service: Cardiovascular;  Laterality: N/A;  . COLONOSCOPY    . COLONOSCOPY WITH PROPOFOL N/A 10/21/2014   Procedure: COLONOSCOPY WITH PROPOFOL;  Surgeon: Ladene Artist, MD;  Location: [facility] ENDOSCOPY;  Service: Endoscopy;  Laterality: N/A;  . EP IMPLANTABLE DEVICE N/A 04/06/2015   Procedure: Loop Recorder Insertion;  Surgeon: Evans Lance, MD;  Location: [place] CV LAB;  Service: Cardiovascular;  Laterality: N/A;  . ESOPHAGOGASTRODUODENOSCOPY    . RIGHT/LEFT HEART CATH AND CORONARY ANGIOGRAPHY N/A 01/28/2017   Procedure: Right/Left Heart Cath and Coronary Angiography;  Surgeon: Larey Dresser, MD;  Location: [place] CV LAB;  Service: Cardiovascular;  Laterality: N/A;  . TEE WITHOUT CARDIOVERSION  04/25/2012   Procedure: TRANSESOPHAGEAL ECHOCARDIOGRAM (TEE);  Surgeon: Thayer Headings, MD;  Location: [place];  Service: Cardiovascular;  Laterality: N/A;  . TEE WITHOUT CARDIOVERSION N/A 03/15/2015   Procedure: TRANSESOPHAGEAL ECHOCARDIOGRAM (TEE);  Surgeon: Thayer Headings, MD;  Location: [place];  Service: Cardiovascular;  Laterality: N/A;  . TONSILLECTOMY    . TOTAL KNEE ARTHROPLASTY Right 06/15/2014   Procedure: TOTAL KNEE ARTHROPLASTY;  Surgeon: Renette Butters, MD;  Location: [place];  Service: Orthopedics;  Laterality: Right;    reports that he quit smoking about 9 years ago. His smoking use included Cigarettes. He has a 60.00 pack-year smoking history. He has  never used smokeless tobacco. He reports that he does not drink alcohol or use drugs. family history includes Allergies in his mother; Asthma in his maternal grandmother and mother; COPD in his mother; Colon polyps in his mother; Hypothyroidism in his mother. Allergies  Allergen Reactions  . Amiodarone Other (See Comments)    hyperthyroidism  . Statins Other (See Comments)    myalgia  . Tape Other (See Comments)    Skin Tears Use Paper Tape Only   Current Outpatient Prescriptions on File Prior to Visit  Medication Sig Dispense Refill  . ADVAIR DISKUS 250-50 MCG/DOSE AEPB INHALE 1 PUFF EVERY DAY AS DIRECTED 60 each 11  . ALPRAZolam (XANAX) 0.5 MG tablet TAKE 1 TO 2 TABLETS BY MOUTH AT BEDTIME AS NEEDED SLEEP 180 tablet 1  . amoxicillin-clavulanate (AUGMENTIN) 875-125 MG tablet Take 1 tablet by mouth 2 (  two) times daily. 14 tablet 0  . aspirin-acetaminophen-caffeine (EXCEDRIN MIGRAINE) 401-027-25 MG tablet     . carisoprodol (SOMA) 350 MG tablet TAKE 1 TABLET BY MOUTH 3 TIMES A DAY AS NEEDED FOR MUSCLE SPASM 90 tablet 5  . carvedilol (COREG) 12.5 MG tablet Take 12.5 mg by mouth 2 (two) times daily with a meal.    . fluticasone (FLONASE) 50 MCG/ACT nasal spray Place 2 sprays into both nostrils daily as needed for allergies or rhinitis. 18.2 g 5  . Fluticasone-Salmeterol (ADVAIR DISKUS) 250-50 MCG/DOSE AEPB Inhale 1 puff into the lungs daily.    . hydrocortisone 2.5 % cream Apply 1 application topically at bedtime. itching     . levothyroxine (SYNTHROID, LEVOTHROID) 25 MCG tablet TAKE 1 TABLET BY MOUTH EVERY DAY BEFORE BREAKFAST 90 tablet 2  . lidocaine (LIDODERM) 5 % Apply 1 patch every 12 hrs to affected area. Do not use more than one patch in 24 hrs. 100 patch 1  . lisinopril (PRINIVIL,ZESTRIL) 2.5 MG tablet Take 1 tablet (2.5 mg total) by mouth daily. 30 tablet 5  . metolazone (ZAROXOLYN) 2.5 MG tablet Take 1 tablet (2.5 mg total) by mouth as directed. 5 tablet 1  . mometasone (ELOCON) 0.1  % ointment Apply sparingly to affected areas daily as needed 45 g 3  . niacin 500 MG tablet     . omeprazole (PRILOSEC) 20 MG capsule Take 1 capsule (20 mg total) by mouth daily with supper. 90 capsule 1  . potassium chloride SA (K-DUR,KLOR-CON) 20 MEQ tablet Take 2 tabs in AM and 1 tab in PM 270 tablet 2  . sertraline (ZOLOFT) 100 MG tablet TAKE 2 TABLETS BY MOUTH AT BEDTIME 180 tablet 3  . silver sulfADIAZINE (SILVADENE) 1 % cream APPLY TO AFFECTED AREA TOPICALLY EVERY DAY 50 g 3  . torsemide (DEMADEX) 20 MG tablet Take 80 mg (4 Tablets) in the AM and 60 mg (3 Tablets) in the PM. 210 tablet 3  . triamcinolone cream (KENALOG) 0.1 % Apply 1 application topically 3 (three) times daily. Apply to nail folds. 30 g 0  . TUMS 500 MG chewable tablet Chew 500-2,000 mg by mouth every 4 (four) hours as needed for indigestion or heartburn.     . VENTOLIN HFA 108 (90 Base) MCG/ACT inhaler INHALE 2 PUFFS 4 TIMES A DAY 18 Inhaler 2  . vitamin E 400 UNIT capsule     . warfarin (COUMADIN) 5 MG tablet Take as directed by anticoagulation clinic 30 tablet 3  . zolpidem (AMBIEN) 10 MG tablet TAKE 1 TABLET BY MOUTH AT BEDTIME 30 tablet 5  . [DISCONTINUED] atorvastatin (LIPITOR) 20 MG tablet Take 1 tablet (20 mg total) by mouth daily. 30 tablet 11  . [DISCONTINUED] gabapentin (NEURONTIN) 300 MG capsule Take 1 capsule (300 mg total) by mouth 3 (three) times daily. 90 capsule 5   No current facility-administered medications on file prior to visit.     Review of Systems Constitutional: Negative for other unusual diaphoresis, sweats, appetite or weight changes HENT: Negative for other worsening hearing loss, ear pain, facial swelling, mouth sores or neck stiffness.   Eyes: Negative for other worsening pain, redness or other visual disturbance.  Respiratory: Negative for other stridor or swelling Cardiovascular: Negative for other palpitations or other chest pain  Gastrointestinal: Negative for worsening diarrhea or  loose stools, blood in stool, distention or other pain Genitourinary: Negative for hematuria, flank pain or other change in urine volume.  Musculoskeletal: Negative for myalgias or  other joint swelling.  Skin: Negative for other color change, or other wound or worsening drainage.  Neurological: Negative for other syncope or numbness. Hematological: Negative for other adenopathy or swelling Psychiatric/Behavioral: Negative for hallucinations, other worsening agitation, SI, self-injury, or new decreased concentration All other system neg per pt    Objective:   Physical Exam BP 96/60   Pulse 67   Ht 5\' 7"  (1.702 m)   Wt 277 lb (125.6 kg)   SpO2 99%   BMI 43.38 kg/m  VS noted, obese, on 2L chronic home o2 Constitutional: Pt is oriented to person, place, and time. Appears well-developed and well-nourished, in no significant distress and comfortable Head: Normocephalic and atraumatic  Eyes: Conjunctivae and EOM are normal except mild erythema with itching,. Pupils are equal, round, and reactive to light Right Ear: External ear normal without discharge Left Ear: External ear normal without discharge Nose: Nose without discharge or deformity Mouth/Throat: Oropharynx is without other ulcerations and moist  Neck: Normal range of motion. Neck supple. No JVD present. No tracheal deviation present or significant neck LA or mass Cardiovascular: Normal rate, regular rhythm, normal heart sounds and intact distal pulses.   Pulmonary/Chest: WOB normal and breath sounds dcreased without rales or wheezing  Abdominal: Soft. Bowel sounds are normal. NT. No HSM  Musculoskeletal: Normal range of motion. Exhibits trace right > left edema Lymphadenopathy: Has no other cervical adenopathy.  Neurological: Pt is alert and oriented to person, place, and time. Pt has normal reflexes. No cranial nerve deficit. Motor grossly intact, Gait intact Skin: Skin is warm and dry. + left inguinal fold area erythem rash  nontender, no new ulcerations, ingrown nail persists with decreased erythema/tender Psychiatric:  Has depressed mood and affect. Behavior is normal without agitation No other exam findings  Lab Results  Component Value Date   WBC 8.6 03/05/2017   HGB 13.1 03/05/2017   HCT 41.1 03/05/2017   PLT 178 03/05/2017   GLUCOSE 81 04/03/2017   CHOL 164 12/04/2016   TRIG 134.0 12/04/2016   HDL 35.20 (L) 12/04/2016   LDLDIRECT 111.0 05/30/2016   LDLCALC 102 (H) 12/04/2016   ALT 19 12/04/2016   AST 21 12/04/2016   NA 137 04/03/2017   K 4.0 04/03/2017   CL 105 04/03/2017   CREATININE 1.30 (H) 04/03/2017   BUN 22 (H) 04/03/2017   CO2 25 04/03/2017   TSH 1.48 11/29/2016   PSA 2.59 05/30/2016   INR 2.3 04/05/2017   HGBA1C 6.0 12/04/2016   MICROALBUR 1.2 05/30/2016       Assessment & Plan:

## 2017-04-18 NOTE — Patient Instructions (Addendum)
Ok to try Youth worker (eye drops) for eye allergy symptoms  Please take all new medication as prescribed - the cream  Please continue all other medications as before, and refills have been done if requested including the trazodone  Please have the pharmacy call with any other refills you may need.  Please continue your efforts at being more active, low cholesterol diet, and weight control.  You are otherwise up to date with prevention measures today.  Please keep your appointments with your specialists as you may have planned  Please go to the LAB in the Basement (turn left off the elevator) for the tests to be done today  You will be contacted by phone if any changes need to be made immediately.  Otherwise, you will receive a letter about your results with an explanation, but please check with MyChart first.  Please remember to sign up for MyChart if you have not done so, as this will be important to you in the future with finding out test results, communicating by private email, and scheduling acute appointments online when needed.  Please return in 6 months, or sooner if needed, with Lab testing done 3-5 days before

## 2017-04-18 NOTE — Telephone Encounter (Signed)
Called pt regarding symptom episode from 7/4~ pt stated that he just felt rapid heart beat and some SOB, pt stated I know I have atrial fibrillation and COPD. Pt did not recall anything specific from 21:38 on 7/4.

## 2017-04-18 NOTE — Assessment & Plan Note (Addendum)
Stable, for BNP today, cont same tx, has f/u with cardiology planned  In addition to the time spent performing CPE, I spent an additional 25 minutes face to face,in which greater than 50% of this time was spent in counseling and coordination of care for patient's acute illness as documented., including the differential diagnosis, eval and tx of diast CHF, fungal rash, DM, allergic conjunctivitis, insomnia, ingrown nail, and glaucoma follow up

## 2017-04-23 DIAGNOSIS — J449 Chronic obstructive pulmonary disease, unspecified: Secondary | ICD-10-CM | POA: Diagnosis not present

## 2017-04-25 ENCOUNTER — Other Ambulatory Visit: Payer: Medicare HMO

## 2017-04-25 ENCOUNTER — Ambulatory Visit (INDEPENDENT_AMBULATORY_CARE_PROVIDER_SITE_OTHER): Payer: Medicare HMO | Admitting: *Deleted

## 2017-04-25 ENCOUNTER — Other Ambulatory Visit (HOSPITAL_COMMUNITY): Payer: Self-pay

## 2017-04-25 DIAGNOSIS — R55 Syncope and collapse: Secondary | ICD-10-CM

## 2017-04-25 NOTE — Progress Notes (Signed)
Paramedicine Encounter    Patient ID: [name], male    DOB: February 07, 1956, 61 y.o.   MRN: [id number]    Patient Care Team: [name], MD as PCP - General Himmelrich, Bryson Ha, RD (Inactive) as Dietitian Ruedinger, Drexel Iha, [facility] as [employer] (Pharmacist)  Patient Active Problem List   Diagnosis Date Noted  . Allergic conjunctivitis 04/18/2017  . Ingrown nail 04/18/2017  . Glaucoma 04/18/2017  . History of nasal polyposis 02/19/2017  . Chronic rhinitis 02/19/2017  . Benign localized prostatic hyperplasia with lower urinary tract symptoms (LUTS) 01/17/2017  . Acute on chronic systolic heart failure ([place])   . CAP (community acquired pneumonia) 12/31/2016  . Acute on chronic respiratory failure ([place]) 12/31/2016  . Lobar pneumonia ([place])   . Atrial fibrillation with RVR ([place])   . Microhematuria 12/04/2016  . Encounter for well adult exam with abnormal findings 11/29/2015  . Atrial fibrillation ([place]) 09/06/2015  . Chest pain 09/01/2015  . Rash 05/26/2015  . Cardiomyopathy ([place])   . Type II diabetes mellitus ([place])   . Hyperlipidemia   . Acute on chronic diastolic heart failure ([place]) 03/30/2015  . Persistent atrial fibrillation ([place]) 03/30/2015  . Syncope 03/30/2015  . Anemia 02/23/2015  . Thyrotoxicosis 02/23/2015  . Hx of adenomatous colonic polyps 10/21/2014  . Hemorrhoid 08/19/2014  . De Quervain's tenosynovitis, right 04/30/2014  . Allergic rhinitis 04/25/2014  . Osteoarthritis of right knee 02/19/2014  . Encounter for therapeutic drug monitoring 11/17/2013  . Thoracic degenerative disc disease 06/17/2013  . Thoracic spondylosis without myelopathy 02/11/2013  . Lumbosacral spondylosis without myelopathy 02/11/2013  . COPD (chronic obstructive pulmonary disease) ([place]) 08/20/2012  . Bundle branch block, right and extreme left anterior fascicular 05/05/2012  . Nonischemic cardiomyopathy ([place]) 05/02/2012  . Anticoagulated on warfarin  03/21/2012  . Spongiotic dermatitis 09/26/2011  . Past myocardial infarction 04/19/2011  . UNSPECIFIED URETHRAL STRICTURE 12/26/2010  . Chronic pain syndrome 05/18/2010  . Obstructive sleep apnea 10/21/2007  . NEPHROLITHIASIS, HX OF 10/21/2007  . Anxiety 06/09/2007  . GERD 06/09/2007  . BENIGN PROSTATIC HYPERTROPHY 06/09/2007  . Morbid obesity ([place]) 06/03/2007  . Depression 06/03/2007  . Essential hypertension 06/03/2007  . INSOMNIA 06/03/2007    Current Outpatient Prescriptions:  .  ADVAIR DISKUS 250-50 MCG/DOSE AEPB, INHALE 1 PUFF EVERY DAY AS DIRECTED, Disp: 60 each, Rfl: 11 .  ALPRAZolam (XANAX) 0.5 MG tablet, TAKE 1 TO 2 TABLETS BY MOUTH AT BEDTIME AS NEEDED SLEEP, Disp: 180 tablet, Rfl: 1 .  amoxicillin-clavulanate (AUGMENTIN) 875-125 MG tablet, Take 1 tablet by mouth 2 (two) times daily., Disp: 14 tablet, Rfl: 0 .  aspirin-acetaminophen-caffeine (EXCEDRIN MIGRAINE) 250-250-65 MG tablet, , Disp: , Rfl:  .  carisoprodol (SOMA) 350 MG tablet, TAKE 1 TABLET BY MOUTH 3 TIMES A DAY AS NEEDED FOR MUSCLE SPASM, Disp: 90 tablet, Rfl: 5 .  carvedilol (COREG) 12.5 MG tablet, Take 12.5 mg by mouth 2 (two) times daily with a meal., Disp: , Rfl:  .  fluticasone (FLONASE) 50 MCG/ACT nasal spray, Place 2 sprays into both nostrils daily as needed for allergies or rhinitis., Disp: 18.2 g, Rfl: 5 .  Fluticasone-Salmeterol (ADVAIR DISKUS) 250-50 MCG/DOSE AEPB, Inhale 1 puff into the lungs daily., Disp: , Rfl:  .  hydrocortisone 2.5 % cream, Apply 1 application topically at bedtime. itching , Disp: , Rfl:  .  ketoconazole (NIZORAL) 2 % cream, Apply 1 application topically daily., Disp: 15 g, Rfl: 0 .  levothyroxine (SYNTHROID, LEVOTHROID) 25  MCG tablet, TAKE 1 TABLET BY MOUTH EVERY DAY BEFORE BREAKFAST, Disp: 90 tablet, Rfl: 2 .  lidocaine (LIDODERM) 5 %, Apply 1 patch every 12 hrs to affected area. Do not use more than one patch in 24 hrs., Disp: 100 patch, Rfl: 1 .  linaclotide (LINZESS) 145 MCG  CAPS capsule, Take 1 capsule (145 mcg total) by mouth every other day., Disp: 30 capsule, Rfl: 11 .  lisinopril (PRINIVIL,ZESTRIL) 2.5 MG tablet, Take 1 tablet (2.5 mg total) by mouth daily., Disp: 30 tablet, Rfl: 5 .  metolazone (ZAROXOLYN) 2.5 MG tablet, Take 1 tablet (2.5 mg total) by mouth as directed., Disp: 5 tablet, Rfl: 1 .  mometasone (ELOCON) 0.1 % ointment, Apply sparingly to affected areas daily as needed, Disp: 45 g, Rfl: 3 .  niacin 500 MG tablet, , Disp: , Rfl:  .  omeprazole (PRILOSEC) 20 MG capsule, Take 1 capsule (20 mg total) by mouth daily with supper., Disp: 90 capsule, Rfl: 1 .  potassium chloride SA (K-DUR,KLOR-CON) 20 MEQ tablet, Take 2 tabs in AM and 1 tab in PM, Disp: 270 tablet, Rfl: 2 .  sertraline (ZOLOFT) 100 MG tablet, TAKE 2 TABLETS BY MOUTH AT BEDTIME, Disp: 180 tablet, Rfl: 3 .  silver sulfADIAZINE (SILVADENE) 1 % cream, APPLY TO AFFECTED AREA TOPICALLY EVERY DAY, Disp: 50 g, Rfl: 3 .  torsemide (DEMADEX) 20 MG tablet, Take 80 mg (4 Tablets) in the AM and 60 mg (3 Tablets) in the PM., Disp: 210 tablet, Rfl: 3 .  traZODone (DESYREL) 100 MG tablet, Take 0.5 tablets (50 mg total) by mouth at bedtime as needed., Disp: 45 tablet, Rfl: 1 .  triamcinolone cream (KENALOG) 0.1 %, Apply 1 application topically 3 (three) times daily. Apply to nail folds., Disp: 30 g, Rfl: 0 .  TUMS 500 MG chewable tablet, Chew 500-2,000 mg by mouth every 4 (four) hours as needed for indigestion or heartburn. , Disp: , Rfl:  .  VENTOLIN HFA 108 (90 Base) MCG/ACT inhaler, INHALE 2 PUFFS 4 TIMES A DAY, Disp: 18 Inhaler, Rfl: 2 .  vitamin E 400 UNIT capsule, , Disp: , Rfl:  .  warfarin (COUMADIN) 5 MG tablet, Take as directed by anticoagulation clinic, Disp: 30 tablet, Rfl: 3 .  zolpidem (AMBIEN) 10 MG tablet, TAKE 1 TABLET BY MOUTH AT BEDTIME, Disp: 30 tablet, Rfl: 5 Allergies  Allergen Reactions  . Amiodarone Other (See Comments)    hyperthyroidism  . Statins Other (See Comments)    myalgia   . Tape Other (See Comments)    Skin Tears Use Paper Tape Only     Social History   Social History  . Marital status: Divorced    Spouse name: N/A  . Number of children: 2  . Years of education: N/A   Occupational History  . retired/disabled. prev worked in Therapist, sports. Disabled   Social History Main Topics  . Smoking status: Former Smoker    Packs/day: 2.00    Years: 30.00    Types: Cigarettes    Quit date: 10/16/2007  . Smokeless tobacco: Never Used  . Alcohol use No  . Drug use: No  . Sexual activity: Not Currently   Other Topics Concern  . Not on file   Social History Narrative   Lives alone.    Physical Exam  Pulmonary/Chest: No respiratory distress. He has no wheezes. He has no rales.  Abdominal: He exhibits no distension. There is no tenderness. There is no guarding.  Musculoskeletal: He exhibits  edema.  Skin: Skin is warm and dry. He is not diaphoretic.        Future Appointments Date Time Provider [place]  04/25/2017 2:00 PM Ranee Gosselin, RN [facility]  04/29/2017 3:00 PM [facility] HEALTH COACH [facility]  05/03/2017 4:00 PM [facility]  05/20/2017 1:40 PM Larey Dresser, MD [facility] None  07/12/2017 9:30 AM Deboraha Sprang, MD [facility]  10/18/2017 1:30 PM [name], MD [facility]    ATF pt CAO x4; today is our initial visit. Pt denies sob, dizziness, headache and chest pain.  Pt stated that he weighs himself daily and has been recording the readings.  He also takes his medications without missing any doses.  He doesn't use a pill box but he showed me which pills he takes and how often.  Pt has started taking zoloft again, he stated that he had started crying a lot again.  Now that he is taking zoloft in the day and xanax at night he feels much better.  pt supposed to wear CPAP but doesn't wear it  any more, because he's worried that it could have mold in the  machine.   He's still taking ibuphren and was advised to take tylenol for pain. he lives with his daughter and her bf which is his support system.  He receives 1385 monthly his medications is about $200 out of pocket. He stated that he has been stressed out lately trying to balance all the different diseases and illness that he has.  He goes to church on Wednesday and tries to stay active during the day.  He was advised to set his appointments for early morning and to do his errands early due to the hot weather.  His truck doesn't have air condition. He was also advised to try to take his second dose of torsemide earlier in the day due to him having trouble sleeping at night.  He will let me know next week if he can see a difference.   rx bottles verified.  Pt agrees to [facility] visits.    Melodie Matar [phone]  Pt takes warfarin as follows: .5 (2.5) mon, wed, fri 1 (5) sun, tues, thurs, sat  There were no vitals taken for this visit.  Weight yesterday-272.2      Jurnei Latini, EMT Paramedic 04/25/2017    ACTION: Home visit completed

## 2017-04-26 ENCOUNTER — Other Ambulatory Visit: Payer: Self-pay | Admitting: Internal Medicine

## 2017-04-26 DIAGNOSIS — I255 Ischemic cardiomyopathy: Secondary | ICD-10-CM

## 2017-04-26 DIAGNOSIS — I428 Other cardiomyopathies: Secondary | ICD-10-CM

## 2017-04-26 DIAGNOSIS — I4819 Other persistent atrial fibrillation: Secondary | ICD-10-CM

## 2017-04-26 NOTE — Progress Notes (Addendum)
Subjective:   [name] is a 61 y.o. male who presents for an Initial Medicare Annual Wellness Visit.  Review of Systems  No ROS.  Medicare Wellness Visit. Additional risk factors are reflected in the social history.    Sleep patterns: gets up 1-3 times nightly to void and sleeps 6 hours nightly. Patient reports insomnia issues, discussed recommended sleep tips and stress reduction tips.  Home Safety/Smoke Alarms: Feels safe in home. Smoke alarms in place.  Living environment; residence and Firearm Safety: 1-story house/ trailer, equipment: Radio producer, Type: Single Point [place] and Walkers, Type: Conservation officer, nature, no firearms. Lives with daughter, no needs for DME, good support system  Seat Belt Safety/Bike Helmet: Wears seat belt.   Counseling:   Eye Exam- appointment as he can afford, education regarding medicare coverage for exam, resources provided Dental- Resources provided, patient has difficulty affording this medical treatment  Male:   CCS- Last 03/04/15, polyps, recall 5 years    PSA-  Lab Results  Component Value Date   PSA 3.29 04/18/2017   PSA 2.59 05/30/2016   PSA 1.93 11/29/2015      Objective:    There were no vitals filed for this visit. There is no height or weight on file to calculate BMI.  Current Medications (verified) Outpatient Encounter Prescriptions as of 04/29/2017  Medication Sig  . ADVAIR DISKUS 250-50 MCG/DOSE AEPB INHALE 1 PUFF EVERY DAY AS DIRECTED  . ALPRAZolam (XANAX) 0.5 MG tablet TAKE 1 TO 2 TABLETS BY MOUTH AT BEDTIME AS NEEDED SLEEP  . amoxicillin-clavulanate (AUGMENTIN) 875-125 MG tablet Take 1 tablet by mouth 2 (two) times daily.  [name] aspirin-acetaminophen-caffeine (EXCEDRIN MIGRAINE) 250-250-65 MG tablet   . carisoprodol (SOMA) 350 MG tablet TAKE 1 TABLET BY MOUTH 3 TIMES A DAY AS NEEDED FOR MUSCLE SPASM  . carvedilol (COREG) 12.5 MG tablet Take 12.5 mg by mouth 2 (two) times daily with a meal.  . fluticasone (FLONASE) 50 MCG/ACT nasal  spray Place 2 sprays into both nostrils daily as needed for allergies or rhinitis.  . Fluticasone-Salmeterol (ADVAIR DISKUS) 250-50 MCG/DOSE AEPB Inhale 1 puff into the lungs daily.  . hydrocortisone 2.5 % cream Apply 1 application topically at bedtime. itching   . ketoconazole (NIZORAL) 2 % cream Apply 1 application topically daily.  [name] levothyroxine (SYNTHROID, LEVOTHROID) 25 MCG tablet TAKE 1 TABLET BY MOUTH EVERY DAY BEFORE BREAKFAST (Patient not taking: Reported on 04/25/2017)  . lidocaine (LIDODERM) 5 % Apply 1 patch every 12 hrs to affected area. Do not use more than one patch in 24 hrs.  . linaclotide (LINZESS) 145 MCG CAPS capsule Take 1 capsule (145 mcg total) by mouth every other day.  . lisinopril (PRINIVIL,ZESTRIL) 2.5 MG tablet Take 1 tablet (2.5 mg total) by mouth daily.  . metolazone (ZAROXOLYN) 2.5 MG tablet Take 1 tablet (2.5 mg total) by mouth as directed.  . mometasone (ELOCON) 0.1 % ointment Apply sparingly to affected areas daily as needed  . niacin 500 MG tablet   . omeprazole (PRILOSEC) 20 MG capsule Take 1 capsule (20 mg total) by mouth daily with supper.  . potassium chloride SA (K-DUR,KLOR-CON) 20 MEQ tablet Take 2 tabs in AM and 1 tab in PM  . sertraline (ZOLOFT) 100 MG tablet TAKE 2 TABLETS BY MOUTH AT BEDTIME  . silver sulfADIAZINE (SILVADENE) 1 % cream APPLY TO AFFECTED AREA TOPICALLY EVERY DAY  . torsemide (DEMADEX) 20 MG tablet Take 80 mg (4 Tablets) in the AM and 60 mg (3 Tablets)  in the PM.  . traZODone (DESYREL) 100 MG tablet Take 0.5 tablets (50 mg total) by mouth at bedtime as needed.  . triamcinolone cream (KENALOG) 0.1 % Apply 1 application topically 3 (three) times daily. Apply to nail folds.  . TUMS 500 MG chewable tablet Chew 500-2,000 mg by mouth every 4 (four) hours as needed for indigestion or heartburn.   . VENTOLIN HFA 108 (90 Base) MCG/ACT inhaler INHALE 2 PUFFS 4 TIMES A DAY  . vitamin E 400 UNIT capsule   . warfarin (COUMADIN) 5 MG tablet Take as  directed by anticoagulation clinic  . zolpidem (AMBIEN) 10 MG tablet TAKE 1 TABLET BY MOUTH AT BEDTIME   No facility-administered encounter medications on file as of 04/29/2017.     Allergies (verified) Amiodarone; Statins; and Tape   History: Past Medical History:  Diagnosis Date  . Anemia    supposed to be taking Vit B but doesn't  . ANXIETY    takes Xanax nightly  . Arthritis   . ASTHMA    Albuterol prn and Advair daily;also takes Prednisone daily  . Asthma   . Atrial fibrillation ([place]) 09/06/2015  . Cardiomyopathy [facility])    a. EF 25% TEE July 2013; b. EF normalized 2015;  c. 03/2015 Echo: EF 40-45%, difrf HK, PASP 38 mmHg, Mild MR, sev LAE/RAE.  [name] Chronic constipation    takes OTC stool softener  . COPD (chronic obstructive pulmonary disease) ([place])   . DEPRESSION    takes Zoloft and Doxepin daily  . Diverticulitis   . DYSKINESIA, ESOPHAGUS   . Essential hypertension       . FIBROMYALGIA   . GERD (gastroesophageal reflux disease)       . Glaucoma   . HYPERLIPIDEMIA    a. Intolerant to statins.  . INSOMNIA    takes Ambien nightly  . Myocardial infarction [facility])    a. 2012 Myoview notable for prior infarct;  b. 03/2015 Lexiscan CL: EF 37%, diff HK, small area of inferior infarct from apex to base-->Med Rx.  . Myocardial infarction ([place])   . O2 dependent    uses oxygen 2 and 1/2 liters per [place] at hs  . Paroxysmal atrial fibrillation ([facility])    a. CHA2DS2VASc = 3--> takes Coumadin;  b. 03/15/2015 Successful TEE/DCCV;  c. 03/2015 recurrent afib, Amio d/c'd in setting of hyperthyroidism.  . Peripheral neuropathy   . Pneumonia 12/2016  . Rash and other nonspecific skin eruption 04/12/2009   no cause found saw dermatologists x 2 and allergist  . SLEEP APNEA, OBSTRUCTIVE    a. doesn't use CPAP  . Syncope    a. 03/2015 s/p MDT LINQ.  [name] Type II diabetes mellitus ([place])        Past Surgical History:  Procedure Laterality Date  . ACNE CYST REMOVAL     2 on back   .  ADENOIDECTOMY    . CARDIAC CATHETERIZATION N/A 03/21/2016   Procedure: Right/Left Heart Cath and Coronary Angiography;  Surgeon: Larey Dresser, MD;  Location: [facility];  Service: Cardiovascular;  Laterality: N/A;  . CARDIOVERSION  04/18/2012   Procedure: CARDIOVERSION;  Surgeon: Fay Records, MD;  Location: [place];  Service: Cardiovascular;  Laterality: N/A;  . CARDIOVERSION  04/25/2012   Procedure: CARDIOVERSION;  Surgeon: Thayer Headings, MD;  Location: [place];  Service: Cardiovascular;  Laterality: N/A;  . CARDIOVERSION  04/25/2012   Procedure: CARDIOVERSION;  Surgeon: Fay Records, MD;  Location: [place];  Service:  Cardiovascular;  Laterality: N/A;  . CARDIOVERSION  05/09/2012   Procedure: CARDIOVERSION;  Surgeon: Sherren Mocha, MD;  [facility];  Service: Cardiovascular;  Laterality: N/A;  changed from crenshaw to cooper by trish/leone-endo  . CARDIOVERSION N/A 03/15/2015   Procedure: CARDIOVERSION;  Surgeon: Thayer Headings, MD;  Location: [facility] ENDOSCOPY;  Service: Cardiovascular;  Laterality: N/A;  . COLONOSCOPY    . COLONOSCOPY WITH PROPOFOL N/A 10/21/2014   Procedure: COLONOSCOPY WITH PROPOFOL;  Surgeon: Ladene Artist, MD;  Location: [facility] ENDOSCOPY;  Service: Endoscopy;  Laterality: N/A;  . EP IMPLANTABLE DEVICE N/A 04/06/2015   Procedure: Loop Recorder Insertion;  Surgeon: Evans Lance, MD;  Location[facility];  Service: Cardiovascular;  Laterality: N/A;  . ESOPHAGOGASTRODUODENOSCOPY    . RIGHT/LEFT HEART CATH AND CORONARY ANGIOGRAPHY N/A 01/28/2017   Procedure: Right/Left Heart Cath and Coronary Angiography;  Surgeon: Larey Dresser, MD;  Location: [place] [facility];  Service: Cardiovascular;  Laterality: N/A;  . TEE WITHOUT CARDIOVERSION  04/25/2012   Procedure: TRANSESOPHAGEAL ECHOCARDIOGRAM (TEE);  Surgeon: Thayer Headings, MD;  Location: [place];  Service: Cardiovascular;  Laterality: N/A;  . TEE WITHOUT CARDIOVERSION N/A 03/15/2015   Procedure:  TRANSESOPHAGEAL ECHOCARDIOGRAM (TEE);  Surgeon: Thayer Headings, MD;  Location: [place];  Service: Cardiovascular;  Laterality: N/A;  . TONSILLECTOMY    . TOTAL KNEE ARTHROPLASTY Right 06/15/2014   Procedure: TOTAL KNEE ARTHROPLASTY;  Surgeon: Renette Butters, MD;  Location: [place];  Service: Orthopedics;  Laterality: Right;   Family History  Problem Relation Age of Onset  . COPD Mother   . Asthma Mother   . Colon polyps Mother   . Allergies Mother   . Hypothyroidism Mother   . Asthma Maternal Grandmother   . Colon cancer Neg Hx    Social History   Occupational History  . retired/disabled. prev worked in Therapist, sports. Disabled   Social History Main Topics  . Smoking status: Former Smoker    Packs/day: 2.00    Years: 30.00    Types: Cigarettes    Quit date: 10/16/2007  . Smokeless tobacco: Never Used  . Alcohol use No  . Drug use: No  . Sexual activity: Not Currently   Tobacco Counseling Counseling given: Not Answered   Activities of Daily Living In your present state of health, do you have any difficulty performing the following activities: 01/28/2017 01/14/2017  Hearing? N N  Vision? N Y  Difficulty concentrating or making decisions? N Y  Walking or climbing stairs? Y Y  Dressing or bathing? N N  Doing errands, shopping? - Y  Conservation officer, nature and eating ? - N  Using the Toilet? - N  In the past six months, have you accidently leaked urine? - N  Do you have problems with loss of bowel control? - N  Managing your Medications? - N  Managing your Finances? - N  Housekeeping or managing your Housekeeping? - N  Some recent data might be hidden    Immunizations and Health Maintenance Immunization History  Administered Date(s) Administered  . Influenza Split 07/09/2011, 07/23/2012  . Influenza Whole 09/16/2006, 08/26/2007, 07/27/2008, 07/26/2009, 07/07/2010  . Influenza,inj,Quad PF,36+ Mos 06/30/2013, 08/19/2014, 06/27/2015, 07/13/2016  . Pneumococcal  Polysaccharide-23 03/29/2010, 07/07/2010, 02/23/2015  . Td 10/16/2003  . Tdap 02/23/2015   Health Maintenance Due  Topic Date Due  . PNEUMOCOCCAL POLYSACCHARIDE VACCINE (2) 03/30/2015  . OPHTHALMOLOGY EXAM  02/13/2016    Patient Care Team: [name], MD as  PCP - General [place], Bryson Ha, RD (Inactive) as Dietitian R[place]inger, Drexel Iha, [place] as [facility] [employer] (Pharmacist)  Indicate any recent Medical Services you may have received from other than [facility] providers in the past year (date may be approximate).    Assessment:   This is a routine wellness examination for Jobe. Physical assessment deferred to PCP.   Hearing/Vision screen No exam data present  Dietary issues and exercise activities discussed:   Diet (meal preparation, eat out, water intake, caffeinated beverages, dairy products, fruits and vegetables): in general, a "healthy" diet  , well balanced, diabetic, low fat/ cholesterol, low salt  Reviewed heart healthy and diabetic diet, encouraged patient to increase daily water intake.  Goals    None     Depression Screen PHQ 2/9 Scores 01/14/2017 05/30/2016 11/15/2015 04/25/2015  PHQ - 2 Score 2 1 2  0  PHQ- 9 Score 12 - 13 -  Exception Documentation - - - Patient refusal    Fall Risk Fall Risk  04/18/2017 01/14/2017 05/30/2016 11/15/2015 05/10/2015  Falls in the past year? No No Yes Yes (No Data)  Number falls in past yr: - - 1 2 or more -  Injury with Fall? - - No No -  Risk Factor Category  - - - High Fall Risk -  Risk for fall due to : - - - History of fall(s) -  Follow up - - - Falls prevention discussed -    Cognitive Function:        Screening Tests Health Maintenance  Topic Date Due  . PNEUMOCOCCAL POLYSACCHARIDE VACCINE (2) 03/30/2015  . OPHTHALMOLOGY EXAM  02/13/2016  . INFLUENZA VACCINE  05/15/2017  . HEMOGLOBIN A1C  10/19/2017  . FOOT EXAM  04/18/2018  . COLONOSCOPY  03/03/2020  . TETANUS/TDAP  02/22/2025  . Hepatitis C  Screening  Completed  . HIV Screening  Completed        Plan:    Discussed low B/P values of which patient states is his baseline with Dr. Quay Burow. Informed patient that Dr. Quay Burow stated for him to contact his cardiologist and schedule an appointment to discuss. Patient was advised to continue to monitor his blood pressure and pulse at home and to bring this information to his cardiology appointment. Patient verbalized understanding.   Continue doing brain stimulating activities (puzzles, reading, adult coloring books, staying active) to keep memory sharp.   Continue to eat heart healthy diet (full of fruits, vegetables, whole grains, lean protein, water--limit salt, fat, and sugar intake) and increase physical activity as tolerated.  I have personally reviewed and noted the following in the patient's chart:   . Medical and social history . Use of alcohol, tobacco or illicit drugs  . Current medications and supplements . Functional ability and status . Nutritional status . Physical activity . Advanced directives . List of other physicians . Vitals . Screenings to include cognitive, depression, and falls . Referrals and appointments  In addition, I have reviewed and discussed with patient certain preventive protocols, quality metrics, and best practice recommendations. A written personalized care plan for preventive services as well as general preventive health recommendations were provided to patient.     Michiel Cowboy, RN   04/26/2017     Medical screening examination/treatment/procedure(s) were performed by non-physician practitioner and as supervising physician I was immediately available for consultation/collaboration. I agree with above. Binnie Rail, MD

## 2017-04-26 NOTE — Progress Notes (Signed)
Carelink Summary Report / Loop Recorder 

## 2017-04-26 NOTE — Progress Notes (Signed)
Pre visit review using our clinic review tool, if applicable. No additional management support is needed unless otherwise documented below in the visit note. 

## 2017-04-29 ENCOUNTER — Other Ambulatory Visit: Payer: Self-pay | Admitting: General Practice

## 2017-04-29 ENCOUNTER — Ambulatory Visit (INDEPENDENT_AMBULATORY_CARE_PROVIDER_SITE_OTHER): Payer: Medicare HMO | Admitting: *Deleted

## 2017-04-29 ENCOUNTER — Other Ambulatory Visit: Payer: Self-pay | Admitting: Internal Medicine

## 2017-04-29 VITALS — BP 92/55 | HR 63 | Resp 20 | Ht 67.0 in | Wt 271.0 lb

## 2017-04-29 DIAGNOSIS — Z23 Encounter for immunization: Secondary | ICD-10-CM | POA: Diagnosis not present

## 2017-04-29 DIAGNOSIS — Z0000 Encounter for general adult medical examination without abnormal findings: Secondary | ICD-10-CM | POA: Diagnosis not present

## 2017-04-29 MED ORDER — WARFARIN SODIUM 5 MG PO TABS: ORAL_TABLET | ORAL | 3 refills | Status: DC

## 2017-04-29 NOTE — Patient Instructions (Addendum)
Continue doing brain stimulating activities (puzzles, reading, adult coloring books, staying active) to keep memory sharp.   Continue to eat heart healthy diet (full of fruits, vegetables, whole grains, lean protein, water--limit salt, fat, and sugar intake) and increase physical activity as tolerated.    [name] , Thank you for taking time to come for your Medicare Wellness Visit. I appreciate your ongoing commitment to your health goals. Please review the following plan we discussed and let me know if I can assist you in the future.   These are the goals we discussed: Goals    . I want my breathing and heart to get better          Continue to exercise, eat a heart healthy, follow doctor's instructions, enjoy life, family and serve God.       This is a list of the screening recommended for you and due dates:  Health Maintenance  Topic Date Due  . Pneumococcal vaccine (2) 03/30/2015  . Eye exam for diabetics  02/13/2016  . Flu Shot  05/15/2017  . Hemoglobin A1C  10/19/2017  . Complete foot exam   04/18/2018  . Colon Cancer Screening  03/03/2020  . Tetanus Vaccine  02/22/2025  .  Hepatitis C: One time screening is recommended by Center for Disease Control  (CDC) for  adults born from [birth year] through 1966.   Completed  . HIV Screening  Completed

## 2017-04-30 ENCOUNTER — Ambulatory Visit: Payer: Medicare HMO | Admitting: Podiatry

## 2017-05-01 ENCOUNTER — Telehealth: Payer: Self-pay | Admitting: Allergy and Immunology

## 2017-05-01 ENCOUNTER — Telehealth: Payer: Self-pay | Admitting: Internal Medicine

## 2017-05-01 MED ORDER — FLUTICASONE-SALMETEROL 250-50 MCG/DOSE IN AEPB: 1.0000 | INHALATION_SPRAY | Freq: Every day | RESPIRATORY_TRACT | 3 refills | Status: DC

## 2017-05-01 MED ORDER — ALBUTEROL SULFATE HFA 108 (90 BASE) MCG/ACT IN AERS: INHALATION_SPRAY | RESPIRATORY_TRACT | 3 refills | Status: DC

## 2017-05-01 NOTE — Telephone Encounter (Signed)
Done hardcopy to [employer] - scripts x 2 for:  See below message from clinical pharmacology [facility] Pharmacist is working with patient to see if he is able to get manufacturer assistance from [place] patient assistance for Advair and Ventolin.   I have patient's portion of application, and application requires prescriptions. Then it can be faxed.    Is your office able to provide prescriptions? I can either pick them up from your office or they can be faxed to me at [facility].   Thanks for your help.   Karrie Meres, PharmD, [place]  [phone]

## 2017-05-01 NOTE — Telephone Encounter (Signed)
Called him a couple hours ago and let him know that we have sent records to [facility] for his DOS.  He appreciated me calling and said that if we did not hear anything, to give him a call back and he would call them again. Made notes and set a tickler on his account.

## 2017-05-01 NOTE — Telephone Encounter (Signed)
Fax number [phone] Lennette Bihari called back for you

## 2017-05-01 NOTE — Telephone Encounter (Addendum)
Called Lennette Bihari, need the fax number to send his the scripts, LVM.

## 2017-05-01 NOTE — Telephone Encounter (Signed)
Scripts have been faxed to [employer]

## 2017-05-01 NOTE — Telephone Encounter (Signed)
Patient has received a letter from [facility] stating that they will not be paying the claim for the patient unless the receive medical records for the appt. Patient has spoken to [employer] and they confirmed this and the patient wants to make sure that the claim get paid  Please contact the patient on the status of this claim

## 2017-05-02 ENCOUNTER — Other Ambulatory Visit (HOSPITAL_COMMUNITY): Payer: Self-pay

## 2017-05-02 ENCOUNTER — Other Ambulatory Visit: Payer: Medicare HMO

## 2017-05-02 LAB — CUP PACEART REMOTE DEVICE CHECK
MDC IDC PG IMPLANT DT: 20160622
MDC IDC SESS DTM: 20180712213952

## 2017-05-02 NOTE — Progress Notes (Signed)
Paramedicine Encounter    Patient ID: [name], male    DOB: 1956/01/29, 61 y.o.   MRN: [id number]    Patient Care Team: [name], MD as PCP - General Himmelrich, Bryson Ha, RD (Inactive) as Dietitian Ruedinger, Drexel Iha, [facility] as [place] [place] [facility] (Pharmacist)  Patient Active Problem List   Diagnosis Date Noted  . Allergic conjunctivitis 04/18/2017  . Ingrown nail 04/18/2017  . Glaucoma 04/18/2017  . History of nasal polyposis 02/19/2017  . Chronic rhinitis 02/19/2017  . Benign localized prostatic hyperplasia with lower urinary tract symptoms (LUTS) 01/17/2017  . Acute on chronic systolic heart failure ([place])   . CAP (community acquired pneumonia) 12/31/2016  . Acute on chronic respiratory failure ([place]) 12/31/2016  . Lobar pneumonia ([place])   . Atrial fibrillation with RVR ([place])   . Microhematuria 12/04/2016  . Encounter for well adult exam with abnormal findings 11/29/2015  . Atrial fibrillation ([place]) 09/06/2015  . Chest pain 09/01/2015  . Rash 05/26/2015  . Cardiomyopathy ([place])   . Type II diabetes mellitus ([place])   . Hyperlipidemia   . Acute on chronic diastolic heart failure ([place]) 03/30/2015  . Persistent atrial fibrillation ([place]) 03/30/2015  . Syncope 03/30/2015  . Anemia 02/23/2015  . Thyrotoxicosis 02/23/2015  . Hx of adenomatous colonic polyps 10/21/2014  . Hemorrhoid 08/19/2014  . De Quervain's tenosynovitis, right 04/30/2014  . Allergic rhinitis 04/25/2014  . Osteoarthritis of right knee 02/19/2014  . Encounter for therapeutic drug monitoring 11/17/2013  . Thoracic degenerative disc disease 06/17/2013  . Thoracic spondylosis without myelopathy 02/11/2013  . Lumbosacral spondylosis without myelopathy 02/11/2013  . COPD (chronic obstructive pulmonary disease) ([place]) 08/20/2012  . Bundle branch block, right and extreme left anterior fascicular 05/05/2012  . Nonischemic cardiomyopathy ([place]) 05/02/2012  . Anticoagulated on warfarin  03/21/2012  . Spongiotic dermatitis 09/26/2011  . Past myocardial infarction 04/19/2011  . UNSPECIFIED URETHRAL STRICTURE 12/26/2010  . Chronic pain syndrome 05/18/2010  . Obstructive sleep apnea 10/21/2007  . NEPHROLITHIASIS, HX OF 10/21/2007  . Anxiety 06/09/2007  . GERD 06/09/2007  . BENIGN PROSTATIC HYPERTROPHY 06/09/2007  . Morbid obesity ([place]) 06/03/2007  . Depression 06/03/2007  . Essential hypertension 06/03/2007  . INSOMNIA 06/03/2007    Current Outpatient Prescriptions:  .  albuterol (VENTOLIN HFA) 108 (90 Base) MCG/ACT inhaler, INHALE 2 PUFFS 4 TIMES A DAY as needed, Disp: 54 Inhaler, Rfl: 3 .  ALPRAZolam (XANAX) 0.5 MG tablet, TAKE 1 TO 2 TABLETS BY MOUTH AT BEDTIME AS NEEDED SLEEP, Disp: 180 tablet, Rfl: 1 .  carvedilol (COREG) 12.5 MG tablet, Take 12.5 mg by mouth 2 (two) times daily with a meal., Disp: , Rfl:  .  lidocaine (LIDODERM) 5 %, Apply 1 patch every 12 hrs to affected area. Do not use more than one patch in 24 hrs., Disp: 100 patch, Rfl: 1 .  omeprazole (PRILOSEC) 20 MG capsule, Take 1 capsule (20 mg total) by mouth daily with supper., Disp: 90 capsule, Rfl: 1 .  potassium chloride SA (K-DUR,KLOR-CON) 20 MEQ tablet, Take 2 tabs in AM and 1 tab in PM, Disp: 270 tablet, Rfl: 2 .  sertraline (ZOLOFT) 100 MG tablet, TAKE 2 TABLETS BY MOUTH AT BEDTIME, Disp: 180 tablet, Rfl: 3 .  torsemide (DEMADEX) 20 MG tablet, Take 80 mg (4 Tablets) in the AM and 60 mg (3 Tablets) in the PM., Disp: 210 tablet, Rfl: 3 .  warfarin (COUMADIN) 5 MG tablet, Take as directed by anticoagulation clinic, Disp: 30 tablet, Rfl:  3 .  acetaminophen (TYLENOL) 500 MG tablet, Take 500 mg by mouth every 6 (six) hours as needed., Disp: , Rfl:  .  aspirin-acetaminophen-caffeine (EXCEDRIN MIGRAINE) 250-250-65 MG tablet, , Disp: , Rfl:  .  carisoprodol (SOMA) 350 MG tablet, TAKE 1 TABLET BY MOUTH 3 TIMES A DAY AS NEEDED FOR MUSCLE SPASM, Disp: 90 tablet, Rfl: 5 .  fluticasone (FLONASE) 50 MCG/ACT nasal  spray, Place 2 sprays into both nostrils daily as needed for allergies or rhinitis., Disp: 18.2 g, Rfl: 5 .  Fluticasone-Salmeterol (ADVAIR DISKUS) 250-50 MCG/DOSE AEPB, Inhale 1 puff into the lungs daily., Disp: 180 each, Rfl: 3 .  hydrocortisone 2.5 % cream, Apply 1 application topically at bedtime. itching , Disp: , Rfl:  .  ketoconazole (NIZORAL) 2 % cream, Apply 1 application topically daily., Disp: 15 g, Rfl: 0 .  linaclotide (LINZESS) 145 MCG CAPS capsule, Take 1 capsule (145 mcg total) by mouth every other day., Disp: 30 capsule, Rfl: 11 .  lisinopril (PRINIVIL,ZESTRIL) 2.5 MG tablet, Take 1 tablet (2.5 mg total) by mouth daily., Disp: 30 tablet, Rfl: 5 .  mometasone (ELOCON) 0.1 % ointment, Apply sparingly to affected areas daily as needed, Disp: 45 g, Rfl: 3 .  niacin 500 MG tablet, , Disp: , Rfl:  .  silver sulfADIAZINE (SILVADENE) 1 % cream, APPLY TO AFFECTED AREA TOPICALLY EVERY DAY, Disp: 50 g, Rfl: 3 .  traZODone (DESYREL) 100 MG tablet, Take 0.5 tablets (50 mg total) by mouth at bedtime as needed., Disp: 45 tablet, Rfl: 1 .  triamcinolone cream (KENALOG) 0.1 %, Apply 1 application topically 3 (three) times daily. Apply to nail folds., Disp: 30 g, Rfl: 0 .  TUMS 500 MG chewable tablet, Chew 500-2,000 mg by mouth every 4 (four) hours as needed for indigestion or heartburn. , Disp: , Rfl:  .  zolpidem (AMBIEN) 10 MG tablet, TAKE 1 TABLET BY MOUTH AT BEDTIME, Disp: 30 tablet, Rfl: 5 Allergies  Allergen Reactions  . Amiodarone Other (See Comments)    hyperthyroidism  . Statins Other (See Comments)    myalgia  . Tape Other (See Comments)    Skin Tears Use Paper Tape Only     Social History   Social History  . Marital status: Divorced    Spouse name: N/A  . Number of children: 2  . Years of education: N/A   Occupational History  . retired/disabled. prev worked in Therapist, sports. Disabled   Social History Main Topics  . Smoking status: Former Smoker    Packs/day: 2.00     Years: 30.00    Types: Cigarettes    Quit date: 10/16/2007  . Smokeless tobacco: Never Used  . Alcohol use No  . Drug use: No  . Sexual activity: Not Currently   Other Topics Concern  . Not on file   Social History Narrative   Lives alone.    Physical Exam  Pulmonary/Chest: No respiratory distress. He has no wheezes. He has no rales.  Abdominal: There is no tenderness. There is no rebound and no guarding.  Musculoskeletal: He exhibits no edema.  Skin: Skin is warm and dry. He is not diaphoretic.        SAFE - 04/25/17 1100      Situation   Heart failure history Exisiting   Comorbidities Atrial fibillation;COPD;HTN   Readmitted within 30 days No   Hospital admission within past 12 months Yes   number of hospital admissions 1   number of ED visits 1  Target Weight 240 lbs     Assessment   Lives alone No   Primary support person [employer]   Mode of transportation personal car   Other services involved None   Home equipement Cane;Home O2;Scale;Walker     Weight   Weighs self daily Yes   Scale provided Yes   Records on weight chart Yes     Resources   Has "Living better w/heart failure" book Yes   Has HF Zone tool Yes   Able to identify yellow zone signs/when to call MD Yes   Records zone daily Yes     Medications   Uses a pill box No   Difficulty obtaining medications No   Mail order medications No   Missed one or more doses of medications per week No     Nutrition   Patient receives meals on wheels No   Patient follows low sodium diet Yes   Has foods at home that meet the current recommended diet Yes   Patient follows low sugar/card diet Yes     Urine   Difficulty urinating No   Changes in urine None     Time spent with patient   Time spent with patient  50 Minutes        Future Appointments Date Time Provider [place]  05/03/2017 4:00 PM [facility]  05/20/2017 1:40 PM Larey Dresser, MD  [facility] None  05/27/2017 1:30 PM [facility] DEVICE REMOTES [facility]  07/12/2017 9:30 AM Deboraha Sprang, MD [facility]  10/18/2017 1:30 PM [name], MD [facility]    ATF pt CAO x4 washing dishes c/o back pain.  The pain is located on the right upper side between the shoulder blades.  Pt stated that he has hx of the same. Pt seen his "wellness therapist" whom gave him a workout routine to do to.  Pt didn't much sleep last night. Pt think that his scale could be wrong. He weighs when he first wake up.  He had a run of a-fib earlier this morning, which subsided on its own.  Pt hasn't missed any medications doses. rx bottles verified.  **Coumadin Mon, wed, fri 2.5 mg/5mg  the other days/lab work tomorrow  BP 98/74 (BP Location: Left Arm, Patient Position: Sitting, Cuff Size: Normal)   Pulse (!) 53   Resp 16   Wt 264 lb (119.7 kg)   SpO2 97%   BMI 41.35 kg/m   Weight yesterday-269 Last visit weight-272lb    [name], EMT Paramedic 05/02/2017    ACTION: Home visit completed

## 2017-05-03 ENCOUNTER — Ambulatory Visit (INDEPENDENT_AMBULATORY_CARE_PROVIDER_SITE_OTHER): Payer: Medicare HMO | Admitting: General Practice

## 2017-05-03 DIAGNOSIS — Z5181 Encounter for therapeutic drug level monitoring: Secondary | ICD-10-CM

## 2017-05-03 LAB — POCT INR: INR: 2.8

## 2017-05-03 NOTE — Progress Notes (Signed)
I have reviewed and agree with the plan. 

## 2017-05-03 NOTE — Patient Instructions (Signed)
Pre visit review using our clinic review tool, if applicable. No additional management support is needed unless otherwise documented below in the visit note. 

## 2017-05-06 ENCOUNTER — Ambulatory Visit: Payer: Medicare HMO | Admitting: Endocrinology

## 2017-05-07 ENCOUNTER — Other Ambulatory Visit: Payer: Self-pay | Admitting: Pharmacist

## 2017-05-07 NOTE — Patient Outreach (Signed)
[facility]) Care Management  05/07/2017  [name] November 20, 1955 [id number]  Follow-up call to patient regarding [employer] Patient Assistance application.    Patient reports he has not heard anything regarding his application.    Contacted [employer] Patient Assistance as a conference call with patient---representative reports his application was received and approved 05/03/17 through 10/14/17 for Advair Diskus and Ventolin.    Representative reports it can take 1-2 days for the pharmacy to receive the prescription and then 7-10 business days for medication to be shipped to patient.  Representative reports medication will come from [employer] [facility].   Call with [employer] was disconnected.    Patient appreciative of assistance with patient assistance application.  He agrees to call [facility] Pharmacist when he receives medication.   Plan:  Place follow-up call to patient next week to see if he receives medication.   Karrie Meres, PharmD, [place] [phone]

## 2017-05-08 ENCOUNTER — Other Ambulatory Visit: Payer: Self-pay | Admitting: Internal Medicine

## 2017-05-08 ENCOUNTER — Telehealth: Payer: Self-pay | Admitting: Cardiology

## 2017-05-08 ENCOUNTER — Other Ambulatory Visit (HOSPITAL_COMMUNITY): Payer: Self-pay

## 2017-05-08 MED ORDER — FLUTICASONE-SALMETEROL 250-50 MCG/DOSE IN AEPB: 1.0000 | INHALATION_SPRAY | Freq: Two times a day (BID) | RESPIRATORY_TRACT | 3 refills | Status: DC

## 2017-05-08 NOTE — Progress Notes (Signed)
4Paramedicine Encounter    Patient ID: [name], male    DOB: April 24, 1956, 60 y.o.   MRN: [id number]    Patient Care Team: [name], MD as PCP - General Himmelrich, Bryson Ha, RD (Inactive) as Dietitian Ruedinger, Drexel Iha, [facility] as [place] [facility] (Pharmacist)  Patient Active Problem List   Diagnosis Date Noted  . Allergic conjunctivitis 04/18/2017  . Ingrown nail 04/18/2017  . Glaucoma 04/18/2017  . History of nasal polyposis 02/19/2017  . Chronic rhinitis 02/19/2017  . Benign localized prostatic hyperplasia with lower urinary tract symptoms (LUTS) 01/17/2017  . Acute on chronic systolic heart failure ([place])   . CAP (community acquired pneumonia) 12/31/2016  . Acute on chronic respiratory failure ([place]) 12/31/2016  . Lobar pneumonia ([place])   . Atrial fibrillation with RVR ([place])   . Microhematuria 12/04/2016  . Encounter for well adult exam with abnormal findings 11/29/2015  . Atrial fibrillation ([place]) 09/06/2015  . Chest pain 09/01/2015  . Rash 05/26/2015  . Cardiomyopathy ([place])   . Type II diabetes mellitus ([place])   . Hyperlipidemia   . Acute on chronic diastolic heart failure ([name]) 03/30/2015  . Persistent atrial fibrillation ([place]) 03/30/2015  . Syncope 03/30/2015  . Anemia 02/23/2015  . Thyrotoxicosis 02/23/2015  . Hx of adenomatous colonic polyps 10/21/2014  . Hemorrhoid 08/19/2014  . De Quervain's tenosynovitis, right 04/30/2014  . Allergic rhinitis 04/25/2014  . Osteoarthritis of right knee 02/19/2014  . Encounter for therapeutic drug monitoring 11/17/2013  . Thoracic degenerative disc disease 06/17/2013  . Thoracic spondylosis without myelopathy 02/11/2013  . Lumbosacral spondylosis without myelopathy 02/11/2013  . COPD (chronic obstructive pulmonary disease) ([place]) 08/20/2012  . Bundle branch block, right and extreme left anterior fascicular 05/05/2012  . Nonischemic cardiomyopathy ([place]) 05/02/2012  . Anticoagulated on warfarin  03/21/2012  . Spongiotic dermatitis 09/26/2011  . Past myocardial infarction 04/19/2011  . UNSPECIFIED URETHRAL STRICTURE 12/26/2010  . Chronic pain syndrome 05/18/2010  . Obstructive sleep apnea 10/21/2007  . NEPHROLITHIASIS, HX OF 10/21/2007  . Anxiety 06/09/2007  . GERD 06/09/2007  . BENIGN PROSTATIC HYPERTROPHY 06/09/2007  . Morbid obesity ([place]) 06/03/2007  . Depression 06/03/2007  . Essential hypertension 06/03/2007  . INSOMNIA 06/03/2007    Current Outpatient Prescriptions:  .  acetaminophen (TYLENOL) 500 MG tablet, Take 500 mg by mouth every 6 (six) hours as needed., Disp: , Rfl:  .  albuterol (VENTOLIN HFA) 108 (90 Base) MCG/ACT inhaler, INHALE 2 PUFFS 4 TIMES A DAY as needed, Disp: 54 Inhaler, Rfl: 3 .  ALPRAZolam (XANAX) 0.5 MG tablet, TAKE 1 TO 2 TABLETS BY MOUTH AT BEDTIME AS NEEDED SLEEP, Disp: 180 tablet, Rfl: 1 .  aspirin-acetaminophen-caffeine (EXCEDRIN MIGRAINE) 250-250-65 MG tablet, , Disp: , Rfl:  .  carisoprodol (SOMA) 350 MG tablet, TAKE 1 TABLET BY MOUTH 3 TIMES A DAY AS NEEDED FOR MUSCLE SPASM, Disp: 90 tablet, Rfl: 5 .  carvedilol (COREG) 12.5 MG tablet, Take 12.5 mg by mouth 2 (two) times daily with a meal., Disp: , Rfl:  .  fluticasone (FLONASE) 50 MCG/ACT nasal spray, Place 2 sprays into both nostrils daily as needed for allergies or rhinitis., Disp: 18.2 g, Rfl: 5 .  Fluticasone-Salmeterol (ADVAIR DISKUS) 250-50 MCG/DOSE AEPB, Inhale 1 puff into the lungs daily., Disp: 180 each, Rfl: 3 .  hydrocortisone 2.5 % cream, Apply 1 application topically at bedtime. itching , Disp: , Rfl:  .  ketoconazole (NIZORAL) 2 % cream, Apply 1 application topically daily., Disp: 15 g, Rfl:  0 .  lidocaine (LIDODERM) 5 %, Apply 1 patch every 12 hrs to affected area. Do not use more than one patch in 24 hrs., Disp: 100 patch, Rfl: 1 .  linaclotide (LINZESS) 145 MCG CAPS capsule, Take 1 capsule (145 mcg total) by mouth every other day., Disp: 30 capsule, Rfl: 11 .  lisinopril  (PRINIVIL,ZESTRIL) 2.5 MG tablet, Take 1 tablet (2.5 mg total) by mouth daily., Disp: 30 tablet, Rfl: 5 .  mometasone (ELOCON) 0.1 % ointment, Apply sparingly to affected areas daily as needed, Disp: 45 g, Rfl: 3 .  niacin 500 MG tablet, , Disp: , Rfl:  .  omeprazole (PRILOSEC) 20 MG capsule, Take 1 capsule (20 mg total) by mouth daily with supper., Disp: 90 capsule, Rfl: 1 .  potassium chloride SA (K-DUR,KLOR-CON) 20 MEQ tablet, Take 2 tabs in AM and 1 tab in PM, Disp: 270 tablet, Rfl: 2 .  sertraline (ZOLOFT) 100 MG tablet, TAKE 2 TABLETS BY MOUTH AT BEDTIME, Disp: 180 tablet, Rfl: 3 .  silver sulfADIAZINE (SILVADENE) 1 % cream, APPLY TO AFFECTED AREA TOPICALLY EVERY DAY, Disp: 50 g, Rfl: 3 .  torsemide (DEMADEX) 20 MG tablet, Take 80 mg (4 Tablets) in the AM and 60 mg (3 Tablets) in the PM., Disp: 210 tablet, Rfl: 3 .  traZODone (DESYREL) 100 MG tablet, Take 0.5 tablets (50 mg total) by mouth at bedtime as needed., Disp: 45 tablet, Rfl: 1 .  triamcinolone cream (KENALOG) 0.1 %, Apply 1 application topically 3 (three) times daily. Apply to nail folds., Disp: 30 g, Rfl: 0 .  TUMS 500 MG chewable tablet, Chew 500-2,000 mg by mouth every 4 (four) hours as needed for indigestion or heartburn. , Disp: , Rfl:  .  warfarin (COUMADIN) 5 MG tablet, Take as directed by anticoagulation clinic, Disp: 30 tablet, Rfl: 3 .  zolpidem (AMBIEN) 10 MG tablet, TAKE 1 TABLET BY MOUTH AT BEDTIME, Disp: 30 tablet, Rfl: 5 Allergies  Allergen Reactions  . Amiodarone Other (See Comments)    hyperthyroidism  . Statins Other (See Comments)    myalgia  . Tape Other (See Comments)    Skin Tears Use Paper Tape Only     Social History   Social History  . Marital status: Divorced    Spouse name: N/A  . Number of children: 2  . Years of education: N/A   Occupational History  . retired/disabled. prev worked in Therapist, sports. Disabled   Social History Main Topics  . Smoking status: Former Smoker    Packs/day:  2.00    Years: 30.00    Types: Cigarettes    Quit date: 10/16/2007  . Smokeless tobacco: Never Used  . Alcohol use No  . Drug use: No  . Sexual activity: Not Currently   Other Topics Concern  . Not on file   Social History Narrative   Lives alone.    Physical Exam  Pulmonary/Chest: No respiratory distress. He has no wheezes. He has no rales.  Abdominal: He exhibits no distension. There is no rebound and no guarding.  Musculoskeletal: He exhibits no edema.  Skin: Skin is warm and dry. He is not diaphoretic.        SAFE - 04/25/17 1100      Situation   Heart failure history Exisiting   Comorbidities Atrial fibillation;COPD;HTN   Readmitted within 30 days No   Hospital admission within past 12 months Yes   number of hospital admissions 1   number of ED visits 1  [facility] Weight 240 lbs     Assessment   Lives alone No   Primary support person [employer]   Mode of transportation personal car   Other services involved None   Home equipement Cane;Home O2;Scale;Walker     Weight   Weighs self daily Yes   Scale provided Yes   Records on weight chart Yes     Resources   Has "Living better w/heart failure" book Yes   Has HF Zone tool Yes   Able to identify yellow zone signs/when to call MD Yes   Records zone daily Yes     Medications   Uses a pill box No   Difficulty obtaining medications No   Mail order medications No   Missed one or more doses of medications per week No     Nutrition   Patient receives meals on wheels No   Patient follows low sodium diet Yes   Has foods at home that meet the current recommended diet Yes   Patient follows low sugar/card diet Yes     Urine   Difficulty urinating No   Changes in urine None     Time spent with patient   Time spent with patient  50 Minutes        Future Appointments Date Time Provider [place]  05/09/2017 9:15 AM [name], MD [facility]  05/17/2017 11:00 AM Ruedinger, Drexel Iha, RPH  [facility] None  05/20/2017 1:40 PM Larey Dresser, MD [facility] None  05/27/2017 1:30 PM [facility] DEVICE REMOTES [facility]  05/31/2017 3:30 PM [facility]  07/12/2017 9:30 AM Deboraha Sprang, MD [facility]  10/18/2017 1:30 PM [name], MD [facility]    ATF pt CAO x4 sitting at the kitchen table c/o dizziness and pain. Pt called me today and advised that he has been dizzy for about 3 days.  He made the statement that "its worse today than normal".  He also has pain in the right shoulder, right side of his back and right leg.  He stated that the pain is more intense than last week and almost unbearable.  Pt's vitals taken including 12 lead and orthostatics.  Pt's vitals noted (bp normal for him), 12 lead ( RBBB and A-fib) verified with AHF clinic which was also normal for him.   Due to pt's complaint I called his primary physician and made another appointment for him.  His next appointment is tomorrow @ 915.    BP 92/60 (BP Location: Right Arm, Patient Position: Sitting, Cuff Size: Large)   Pulse (!) 104   Resp 12   Wt 265 lb 6.4 oz (120.4 kg)   SpO2 100%   BMI 41.57 kg/m   Weight yesterday-266 Last visit weight-264    [name], EMT Paramedic 05/08/2017    ACTION: Home visit completed

## 2017-05-08 NOTE — Telephone Encounter (Signed)
Spoke w/ pt and requested that he send a manual transmission b/c his home monitor has not updated in at least 14 days.   

## 2017-05-09 ENCOUNTER — Ambulatory Visit (HOSPITAL_COMMUNITY)
Admission: RE | Admit: 2017-05-09 | Discharge: 2017-05-09 | Disposition: A | Discharge status: Home / Self Care | Payer: Medicare HMO | Source: Ambulatory Visit | Attending: Cardiology | Admitting: Cardiology

## 2017-05-09 ENCOUNTER — Other Ambulatory Visit (INDEPENDENT_AMBULATORY_CARE_PROVIDER_SITE_OTHER): Payer: Medicare HMO

## 2017-05-09 ENCOUNTER — Encounter: Payer: Self-pay | Admitting: Internal Medicine

## 2017-05-09 ENCOUNTER — Ambulatory Visit (INDEPENDENT_AMBULATORY_CARE_PROVIDER_SITE_OTHER): Payer: Medicare HMO | Admitting: Internal Medicine

## 2017-05-09 VITALS — BP 110/64 | HR 65 | Ht 67.0 in | Wt 269.0 lb

## 2017-05-09 DIAGNOSIS — M546 Pain in thoracic spine: Secondary | ICD-10-CM

## 2017-05-09 DIAGNOSIS — M7989 Other specified soft tissue disorders: Secondary | ICD-10-CM | POA: Insufficient documentation

## 2017-05-09 DIAGNOSIS — L03115 Cellulitis of right lower limb: Secondary | ICD-10-CM

## 2017-05-09 DIAGNOSIS — J3089 Other allergic rhinitis: Secondary | ICD-10-CM

## 2017-05-09 DIAGNOSIS — R1031 Right lower quadrant pain: Secondary | ICD-10-CM

## 2017-05-09 LAB — CBC WITH DIFFERENTIAL/PLATELET
Basophils Absolute: 0 10*3/uL (ref 0.0–0.1)
Basophils Relative: 0.3 % (ref 0.0–3.0)
Eosinophils Absolute: 0.2 10*3/uL (ref 0.0–0.7)
Eosinophils Relative: 2.4 % (ref 0.0–5.0)
HCT: 37.4 % — ABNORMAL LOW (ref 39.0–52.0)
Hemoglobin: 12.5 g/dL — ABNORMAL LOW (ref 13.0–17.0)
Lymphocytes Relative: 12.1 % (ref 12.0–46.0)
Lymphs Abs: 1.1 10*3/uL (ref 0.7–4.0)
MCHC: 33.4 g/dL (ref 30.0–36.0)
MCV: 97.7 fl (ref 78.0–100.0)
Monocytes Absolute: 1 10*3/uL (ref 0.1–1.0)
Monocytes Relative: 11 % (ref 3.0–12.0)
Neutro Abs: 6.8 10*3/uL (ref 1.4–7.7)
Neutrophils Relative %: 74.2 % (ref 43.0–77.0)
Platelets: 137 10*3/uL — ABNORMAL LOW (ref 150.0–400.0)
RBC: 3.83 Mil/uL — ABNORMAL LOW (ref 4.22–5.81)
RDW: 16.5 % — ABNORMAL HIGH (ref 11.5–15.5)
WBC: 9.2 10*3/uL (ref 4.0–10.5)

## 2017-05-09 LAB — HEPATIC FUNCTION PANEL
ALT: 13 U/L (ref 0–53)
AST: 16 U/L (ref 0–37)
Albumin: 3.9 g/dL (ref 3.5–5.2)
Alkaline Phosphatase: 39 U/L (ref 39–117)
BILIRUBIN TOTAL: 0.4 mg/dL (ref 0.2–1.2)
Bilirubin, Direct: 0.1 mg/dL (ref 0.0–0.3)
Total Protein: 6.9 g/dL (ref 6.0–8.3)

## 2017-05-09 LAB — URINALYSIS, ROUTINE W REFLEX MICROSCOPIC
Bilirubin Urine: NEGATIVE
Hgb urine dipstick: NEGATIVE
Ketones, ur: NEGATIVE
Leukocytes, UA: NEGATIVE
Nitrite: NEGATIVE
RBC / HPF: NONE SEEN (ref 0–?)
Specific Gravity, Urine: 1.01 (ref 1.000–1.030)
Total Protein, Urine: NEGATIVE
Urine Glucose: NEGATIVE
Urobilinogen, UA: 0.2 (ref 0.0–1.0)
pH: 5.5 (ref 5.0–8.0)

## 2017-05-09 LAB — LIPASE: Lipase: 34 U/L (ref 11.0–59.0)

## 2017-05-09 LAB — BASIC METABOLIC PANEL
BUN: 36 mg/dL — ABNORMAL HIGH (ref 6–23)
CO2: 21 mEq/L (ref 19–32)
Calcium: 9.2 mg/dL (ref 8.4–10.5)
Chloride: 109 mEq/L (ref 96–112)
Creatinine, Ser: 1.23 mg/dL (ref 0.40–1.50)
GFR: 63.29 mL/min (ref >60.00)
Glucose, Bld: 83 mg/dL (ref 70–99)
Potassium: 3.8 mEq/L (ref 3.5–5.1)
Sodium: 139 mEq/L (ref 135–145)

## 2017-05-09 MED ORDER — SULFAMETHOXAZOLE-TRIMETHOPRIM 800-160 MG PO TABS: 1.0000 | ORAL_TABLET | Freq: Two times a day (BID) | ORAL | 0 refills | Status: DC

## 2017-05-09 MED ORDER — TRAMADOL HCL 50 MG PO TABS: 50.0000 mg | ORAL_TABLET | Freq: Four times a day (QID) | ORAL | 0 refills | Status: DC | PRN

## 2017-05-09 NOTE — Patient Instructions (Addendum)
Please take all new medication as prescribed - the tramadol for pain, and the antibiotic (for the leg, but would likely treat a  Bladder infection if you have one)  Please continue all other medications as before, and refills have been done if requested.  Please have the pharmacy call with any other refills you may need  Please keep your appointments with your specialists as you may have planned  You will be contacted regarding the referral for: Leg vein circulation test to check for blood clots in the deep veins (to see [facility] now)  You will be contacted by phone if any changes need to be made immediately.  Otherwise, you will receive a letter about your results with an explanation, but please check with MyChart first.  Please remember to sign up for MyChart if you have not done so, as this will be important to you in the future with finding out test results, communicating by private email, and scheduling acute appointments online when needed.

## 2017-05-09 NOTE — Progress Notes (Signed)
Subjective:    Patient ID: [name], male    DOB: April 26, 1956, 61 y.o.   MRN: [id number]  HPI  Here to f/u with c/o 3 days onset Right leg redness, tender, swelling with mild feeling warm, no chills, no drainage.  Pt denies chest pain, increased sob or doe, wheezing, orthopnea, PND, palpitations, dizziness or syncope. Has hx of chronic venous insufficiency but this right leg swelling is different.   Pt denies polydipsia, polyuria.  Also Does have several wks ongoing eye and nasal allergy symptoms with clearish congestion, itch and sneezing, without fever, pain, ST, cough, swelling or wheezing.  Also has a tender spot to the right periscapular area without rash or swelling.  Also mentions RLQ pain intermittent for several wks, sharp, mild, intermittent without radiation and Denies worsening reflux, dysphagia, n/v, bowel change or blood. Denies dysuria or worsening frequency. Past Medical History:  Diagnosis Date  . Anemia    supposed to be taking Vit B but doesn't  . ANXIETY    takes Xanax nightly  . Arthritis   . ASTHMA    Albuterol prn and Advair daily;also takes Prednisone daily  . Asthma   . Atrial fibrillation ([place]) 09/06/2015  . Cardiomyopathy [facility])    a. EF 25% TEE July 2013; b. EF normalized 2015;  c. 03/2015 Echo: EF 40-45%, difrf HK, PASP 38 mmHg, Mild MR, sev LAE/RAE.  [name] Chronic constipation    takes OTC stool softener  . COPD (chronic obstructive pulmonary disease) ([place])   . DEPRESSION    takes Zoloft and Doxepin daily  . Diverticulitis   . DYSKINESIA, ESOPHAGUS   . Essential hypertension       . FIBROMYALGIA   . GERD (gastroesophageal reflux disease)       . Glaucoma   . HYPERLIPIDEMIA    a. Intolerant to statins.  . INSOMNIA    takes Ambien nightly  . Myocardial infarction [facility])    a. 2012 Myoview notable for prior infarct;  b. 03/2015 Lexiscan CL: EF 37%, diff HK, small area of inferior infarct from apex to base-->Med Rx.  . Myocardial infarction ([place])   . O2  dependent    uses oxygen 2 and 1/2 liters per [place] at hs  . Paroxysmal atrial fibrillation (HCC)    a. CHA2DS2VASc = 3--> takes Coumadin;  b. 03/15/2015 Successful TEE/DCCV;  c. 03/2015 recurrent afib, Amio d/c'd in setting of hyperthyroidism.  . Peripheral neuropathy   . Pneumonia 12/2016  . Rash and other nonspecific skin eruption 04/12/2009   no cause found saw dermatologists x 2 and allergist  . SLEEP APNEA, OBSTRUCTIVE    a. doesn't use CPAP  . Syncope    a. 03/2015 s/p MDT LINQ.  [name] Type II diabetes mellitus ([place])        Past Surgical History:  Procedure Laterality Date  . ACNE CYST REMOVAL     2 on back   . ADENOIDECTOMY    . CARDIAC CATHETERIZATION N/A 03/21/2016   Procedure: Right/Left Heart Cath and Coronary Angiography;  Surgeon: Larey Dresser, MD;  Location: [place] CV LAB;  Service: Cardiovascular;  Laterality: N/A;  . CARDIOVERSION  04/18/2012   Procedure: CARDIOVERSION;  Surgeon: Fay Records, MD;  Location: [place];  Service: Cardiovascular;  Laterality: N/A;  . CARDIOVERSION  04/25/2012   Procedure: CARDIOVERSION;  Surgeon: Thayer Headings, MD;  Location: [place];  Service: Cardiovascular;  Laterality: N/A;  . CARDIOVERSION  04/25/2012   Procedure: CARDIOVERSION;  Surgeon: Fay Records, MD;  Location: [place];  Service: Cardiovascular;  Laterality: N/A;  . CARDIOVERSION  05/09/2012   Procedure: CARDIOVERSION;  Surgeon: Sherren Mocha, MD;  Location: [place];  Service: Cardiovascular;  Laterality: N/A;  changed from crenshaw to cooper by trish/leone-endo  . CARDIOVERSION N/A 03/15/2015   Procedure: CARDIOVERSION;  Surgeon: Thayer Headings, MD;  Location: [facility] ENDOSCOPY;  Service: Cardiovascular;  Laterality: N/A;  . COLONOSCOPY    . COLONOSCOPY WITH PROPOFOL N/A 10/21/2014   Procedure: COLONOSCOPY WITH PROPOFOL;  Surgeon: Ladene Artist, MD;  Location: [facility] ENDOSCOPY;  Service: Endoscopy;  Laterality: N/A;  . EP IMPLANTABLE DEVICE N/A 04/06/2015   Procedure: Loop Recorder  Insertion;  Surgeon: Evans Lance, MD;  Location: [place] CV LAB;  Service: Cardiovascular;  Laterality: N/A;  . ESOPHAGOGASTRODUODENOSCOPY    . RIGHT/LEFT HEART CATH AND CORONARY ANGIOGRAPHY N/A 01/28/2017   Procedure: Right/Left Heart Cath and Coronary Angiography;  Surgeon: Larey Dresser, MD;  Location: [place] CV LAB;  Service: Cardiovascular;  Laterality: N/A;  . TEE WITHOUT CARDIOVERSION  04/25/2012   Procedure: TRANSESOPHAGEAL ECHOCARDIOGRAM (TEE);  Surgeon: Thayer Headings, MD;  Location: [place];  Service: Cardiovascular;  Laterality: N/A;  . TEE WITHOUT CARDIOVERSION N/A 03/15/2015   Procedure: TRANSESOPHAGEAL ECHOCARDIOGRAM (TEE);  Surgeon: Thayer Headings, MD;  Location: [place];  Service: Cardiovascular;  Laterality: N/A;  . TONSILLECTOMY    . TOTAL KNEE ARTHROPLASTY Right 06/15/2014   Procedure: TOTAL KNEE ARTHROPLASTY;  Surgeon: Renette Butters, MD;  Location: [place];  Service: Orthopedics;  Laterality: Right;    reports that he quit smoking about 9 years ago. His smoking use included Cigarettes. He has a 60.00 pack-year smoking history. He has never used smokeless tobacco. He reports that he does not drink alcohol or use drugs. family history includes Allergies in his mother; Asthma in his maternal grandmother and mother; COPD in his mother; Colon polyps in his mother; Hypothyroidism in his mother. Allergies  Allergen Reactions  . Amiodarone Other (See Comments)    hyperthyroidism  . Statins Other (See Comments)    myalgia  . Tape Other (See Comments)    Skin Tears Use Paper Tape Only   Current Outpatient Prescriptions on File Prior to Visit  Medication Sig Dispense Refill  . acetaminophen (TYLENOL) 500 MG tablet Take 500 mg by mouth every 6 (six) hours as needed.    [name] albuterol (VENTOLIN HFA) 108 (90 Base) MCG/ACT inhaler INHALE 2 PUFFS 4 TIMES A DAY as needed 54 Inhaler 3  . ALPRAZolam (XANAX) 0.5 MG tablet TAKE 1 TO 2 TABLETS BY MOUTH AT BEDTIME AS  NEEDED SLEEP 180 tablet 1  . aspirin-acetaminophen-caffeine (EXCEDRIN MIGRAINE) 250-250-65 MG tablet     . carisoprodol (SOMA) 350 MG tablet TAKE 1 TABLET BY MOUTH 3 TIMES A DAY AS NEEDED FOR MUSCLE SPASM 90 tablet 5  . carvedilol (COREG) 12.5 MG tablet Take 12.5 mg by mouth 2 (two) times daily with a meal.    . fluticasone (FLONASE) 50 MCG/ACT nasal spray Place 2 sprays into both nostrils daily as needed for allergies or rhinitis. 18.2 g 5  . Fluticasone-Salmeterol (ADVAIR DISKUS) 250-50 MCG/DOSE AEPB Inhale 1 puff into the lungs 2 (two) times daily. 180 each 3  . hydrocortisone 2.5 % cream Apply 1 application topically at bedtime. itching     . ketoconazole (NIZORAL) 2 % cream Apply 1 application topically daily. 15 g 0  . lidocaine (LIDODERM) 5 % Apply 1 patch  every 12 hrs to affected area. Do not use more than one patch in 24 hrs. 100 patch 1  . linaclotide (LINZESS) 145 MCG CAPS capsule Take 1 capsule (145 mcg total) by mouth every other day. 30 capsule 11  . lisinopril (PRINIVIL,ZESTRIL) 2.5 MG tablet Take 1 tablet (2.5 mg total) by mouth daily. 30 tablet 5  . mometasone (ELOCON) 0.1 % ointment Apply sparingly to affected areas daily as needed 45 g 3  . niacin 500 MG tablet     . omeprazole (PRILOSEC) 20 MG capsule Take 1 capsule (20 mg total) by mouth daily with supper. 90 capsule 1  . potassium chloride SA (K-DUR,KLOR-CON) 20 MEQ tablet Take 2 tabs in AM and 1 tab in PM 270 tablet 2  . sertraline (ZOLOFT) 100 MG tablet TAKE 2 TABLETS BY MOUTH AT BEDTIME 180 tablet 3  . silver sulfADIAZINE (SILVADENE) 1 % cream APPLY TO AFFECTED AREA TOPICALLY EVERY DAY 50 g 3  . torsemide (DEMADEX) 20 MG tablet Take 80 mg (4 Tablets) in the AM and 60 mg (3 Tablets) in the PM. 210 tablet 3  . traZODone (DESYREL) 100 MG tablet Take 0.5 tablets (50 mg total) by mouth at bedtime as needed. 45 tablet 1  . triamcinolone cream (KENALOG) 0.1 % Apply 1 application topically 3 (three) times daily. Apply to nail  folds. 30 g 0  . TUMS 500 MG chewable tablet Chew 500-2,000 mg by mouth every 4 (four) hours as needed for indigestion or heartburn.     . warfarin (COUMADIN) 5 MG tablet Take as directed by anticoagulation clinic 30 tablet 3  . zolpidem (AMBIEN) 10 MG tablet TAKE 1 TABLET BY MOUTH AT BEDTIME 30 tablet 5  . [DISCONTINUED] atorvastatin (LIPITOR) 20 MG tablet Take 1 tablet (20 mg total) by mouth daily. 30 tablet 11  . [DISCONTINUED] gabapentin (NEURONTIN) 300 MG capsule Take 1 capsule (300 mg total) by mouth 3 (three) times daily. 90 capsule 5   No current facility-administered medications on file prior to visit.    Review of Systems  Constitutional: Negative for other unusual diaphoresis or sweats HENT: Negative for ear discharge or swelling Eyes: Negative for other worsening visual disturbances Respiratory: Negative for stridor or other swelling  Gastrointestinal: Negative for worsening distension or other blood Genitourinary: Negative for retention or other urinary change Musculoskeletal: Negative for other MSK pain or swelling Skin: Negative for color change or other new lesions Neurological: Negative for worsening tremors and other numbness  Psychiatric/Behavioral: Negative for worsening agitation or other fatigue All other system neg per pt    Objective:   Physical Exam BP 110/64   Pulse 65   Ht 5\' 7"  (1.702 m)   Wt 269 lb (122 kg)   SpO2 98%   BMI 42.13 kg/m  VS noted,  Constitutional: Pt appears in NAD HENT: Head: NCAT.  Right Ear: External ear normal.  Left Ear: External ear normal.  Eyes: . Pupils are equal, round, and reactive to light. Conjunctivae with bilat puffy eyelids and erythema with clear drainage, and EOM are normal Nose: without d/c or deformity Neck: Neck supple. Gross normal ROM Cardiovascular: Normal rate and regular rhythm.   Pulmonary/Chest: Effort normal and breath sounds without rales or wheezing. Abd - soft , NT , + BS + mild tender to right  medial periscapular area without swelling or rash   Neurological: Pt is alert. At baseline orientation, motor grossly intact Skin: Skin is warm. No rashes, other new lesions,  RLE  edema 2-3+ to right knee, with tender erythema to distal pretibial area approx 3x3 cm area without drainage or phlebitis Psychiatric: Pt behavior is normal without agitation , mild dysphoric today    Assessment & Plan:

## 2017-05-12 DIAGNOSIS — G4733 Obstructive sleep apnea (adult) (pediatric): Secondary | ICD-10-CM | POA: Diagnosis not present

## 2017-05-12 NOTE — Assessment & Plan Note (Signed)
C/w msk spasm. Cont muscle relaxer prn,  to f/u any worsening symptoms or concerns

## 2017-05-12 NOTE — Assessment & Plan Note (Signed)
Mild to mod, for antibx course,  to f/u any worsening symptoms or concerns 

## 2017-05-12 NOTE — Assessment & Plan Note (Signed)
Also cant r/o dvt - for LE venous doppler

## 2017-05-12 NOTE — Assessment & Plan Note (Signed)
?   MSK, exam benign, for labs evaluation, consider ct pending results

## 2017-05-12 NOTE — Assessment & Plan Note (Signed)
Ok to restart otc flonase,  to f/u any worsening symptoms or concerns

## 2017-05-15 ENCOUNTER — Encounter: Payer: Self-pay | Admitting: Internal Medicine

## 2017-05-15 ENCOUNTER — Ambulatory Visit (INDEPENDENT_AMBULATORY_CARE_PROVIDER_SITE_OTHER): Payer: Medicare HMO | Admitting: Internal Medicine

## 2017-05-15 VITALS — BP 104/70 | HR 82 | Wt 261.0 lb

## 2017-05-15 DIAGNOSIS — M5416 Radiculopathy, lumbar region: Secondary | ICD-10-CM | POA: Insufficient documentation

## 2017-05-15 DIAGNOSIS — I255 Ischemic cardiomyopathy: Secondary | ICD-10-CM

## 2017-05-15 DIAGNOSIS — I428 Other cardiomyopathies: Secondary | ICD-10-CM | POA: Diagnosis not present

## 2017-05-15 DIAGNOSIS — B001 Herpesviral vesicular dermatitis: Secondary | ICD-10-CM

## 2017-05-15 DIAGNOSIS — L03115 Cellulitis of right lower limb: Secondary | ICD-10-CM | POA: Diagnosis not present

## 2017-05-15 MED ORDER — VALACYCLOVIR HCL 1 G PO TABS: 1000.0000 mg | ORAL_TABLET | Freq: Three times a day (TID) | ORAL | 2 refills | Status: DC

## 2017-05-15 NOTE — Progress Notes (Signed)
Subjective:    Patient ID: [name], male    DOB: 10-19-1955, 61 y.o.   MRN: [id number]    HPI  Here to f/u with family, to c/o sudden outbreak of cold sores to left lower lip x 2 days, mod to severe pain, constant, sharp, worse to talk or eat, last outbreak many years ago. Recent RLE cellulitis nicely resolved.    Also Pt continues to have recurring midline LBP worse in the past month, now constant, sharp, mod to severe, without bowel or bladder change, fever, wt loss,  worsening LE pain/numbness/weakness, gait change or falls. Now can no longer sit more than 15-30 min or stand for 15 min, or walk > 1-200 yards,.  Walks with cane  Pt denies chest pain, increased sob or doe, wheezing, orthopnea, PND, increased LE swelling, palpitations, dizziness or syncope. Past Medical History:  Diagnosis Date  . Anemia    supposed to be taking Vit B but doesn't  . ANXIETY    takes Xanax nightly  . Arthritis   . ASTHMA    Albuterol prn and Advair daily;also takes Prednisone daily  . Asthma   . Atrial fibrillation ([place]) 09/06/2015  . Cardiomyopathy [facility])    a. EF 25% TEE July 2013; b. EF normalized 2015;  c. 03/2015 Echo: EF 40-45%, difrf HK, PASP 38 mmHg, Mild MR, sev LAE/RAE.  [name] Chronic constipation    takes OTC stool softener  . COPD (chronic obstructive pulmonary disease) ([place])   . DEPRESSION    takes Zoloft and Doxepin daily  . Diverticulitis   . DYSKINESIA, ESOPHAGUS   . Essential hypertension       . FIBROMYALGIA   . GERD (gastroesophageal reflux disease)       . Glaucoma   . HYPERLIPIDEMIA    a. Intolerant to statins.  . INSOMNIA    takes Ambien nightly  . Myocardial infarction [facility])    a. 2012 Myoview notable for prior infarct;  b. 03/2015 Lexiscan CL: EF 37%, diff HK, small area of inferior infarct from apex to base-->Med Rx.  . Myocardial infarction ([place])   . O2 dependent    uses oxygen 2 and 1/2 liters per [place] at hs  . Paroxysmal atrial fibrillation ([facility])    a. CHA2DS2VASc =  3--> takes Coumadin;  b. 03/15/2015 Successful TEE/DCCV;  c. 03/2015 recurrent afib, Amio d/c'd in setting of hyperthyroidism.  . Peripheral neuropathy   . Pneumonia 12/2016  . Rash and other nonspecific skin eruption 04/12/2009   no cause found saw dermatologists x 2 and allergist  . SLEEP APNEA, OBSTRUCTIVE    a. doesn't use CPAP  . Syncope    a. 03/2015 s/p MDT LINQ.  [name] Type II diabetes mellitus ([place])        Past Surgical History:  Procedure Laterality Date  . ACNE CYST REMOVAL     2 on back   . ADENOIDECTOMY    . CARDIAC CATHETERIZATION N/A 03/21/2016   Procedure: Right/Left Heart Cath and Coronary Angiography;  Surgeon: Larey Dresser, MD;  Location: [place] CV LAB;  Service: Cardiovascular;  Laterality: N/A;  . CARDIOVERSION  04/18/2012   Procedure: CARDIOVERSION;  Surgeon: Fay Records, MD;  Location: [place];  Service: Cardiovascular;  Laterality: N/A;  . CARDIOVERSION  04/25/2012   Procedure: CARDIOVERSION;  Surgeon: Thayer Headings, MD;  Location: [facility] ENDOSCOPY;  Service: Cardiovascular;  Laterality: N/A;  . CARDIOVERSION  04/25/2012   Procedure: CARDIOVERSION;  Surgeon: Fay Records,  MD;  Location: [place];  Service: Cardiovascular;  Laterality: N/A;  . CARDIOVERSION  05/09/2012   Procedure: CARDIOVERSION;  Surgeon: Sherren Mocha, MD;  Location: [place];  Service: Cardiovascular;  Laterality: N/A;  changed from crenshaw to cooper by trish/leone-endo  . CARDIOVERSION N/A 03/15/2015   Procedure: CARDIOVERSION;  Surgeon: Thayer Headings, MD;  Location: [facility] ENDOSCOPY;  Service: Cardiovascular;  Laterality: N/A;  . COLONOSCOPY    . COLONOSCOPY WITH PROPOFOL N/A 10/21/2014   Procedure: COLONOSCOPY WITH PROPOFOL;  Surgeon: Ladene Artist, MD;  Location: [facility] ENDOSCOPY;  Service: Endoscopy;  Laterality: N/A;  . EP IMPLANTABLE DEVICE N/A 04/06/2015   Procedure: Loop Recorder Insertion;  Surgeon: Evans Lance, MD;  Location: [place] CV LAB;  Service: Cardiovascular;  Laterality: N/A;  .  ESOPHAGOGASTRODUODENOSCOPY    . RIGHT/LEFT HEART CATH AND CORONARY ANGIOGRAPHY N/A 01/28/2017   Procedure: Right/Left Heart Cath and Coronary Angiography;  Surgeon: Larey Dresser, MD;  Location: [place] CV LAB;  Service: Cardiovascular;  Laterality: N/A;  . TEE WITHOUT CARDIOVERSION  04/25/2012   Procedure: TRANSESOPHAGEAL ECHOCARDIOGRAM (TEE);  Surgeon: Thayer Headings, MD;  Location: [place];  Service: Cardiovascular;  Laterality: N/A;  . TEE WITHOUT CARDIOVERSION N/A 03/15/2015   Procedure: TRANSESOPHAGEAL ECHOCARDIOGRAM (TEE);  Surgeon: Thayer Headings, MD;  Location: [place];  Service: Cardiovascular;  Laterality: N/A;  . TONSILLECTOMY    . TOTAL KNEE ARTHROPLASTY Right 06/15/2014   Procedure: TOTAL KNEE ARTHROPLASTY;  Surgeon: Renette Butters, MD;  Location: [place];  Service: Orthopedics;  Laterality: Right;    reports that he quit smoking about 9 years ago. His smoking use included Cigarettes. He has a 60.00 pack-year smoking history. He has never used smokeless tobacco. He reports that he does not drink alcohol or use drugs. family history includes Allergies in his mother; Asthma in his maternal grandmother and mother; COPD in his mother; Colon polyps in his mother; Hypothyroidism in his mother. Allergies  Allergen Reactions  . Amiodarone Other (See Comments)    hyperthyroidism  . Statins Other (See Comments)    myalgia  . Tape Other (See Comments)    Skin Tears Use Paper Tape Only   Current Outpatient Prescriptions on File Prior to Visit  Medication Sig Dispense Refill  . acetaminophen (TYLENOL) 500 MG tablet Take 500 mg by mouth every 6 (six) hours as needed.    [name] albuterol (VENTOLIN HFA) 108 (90 Base) MCG/ACT inhaler INHALE 2 PUFFS 4 TIMES A DAY as needed 54 Inhaler 3  . ALPRAZolam (XANAX) 0.5 MG tablet TAKE 1 TO 2 TABLETS BY MOUTH AT BEDTIME AS NEEDED SLEEP 180 tablet 1  . aspirin-acetaminophen-caffeine (EXCEDRIN MIGRAINE) 250-250-65 MG tablet     . carisoprodol  (SOMA) 350 MG tablet TAKE 1 TABLET BY MOUTH 3 TIMES A DAY AS NEEDED FOR MUSCLE SPASM 90 tablet 5  . carvedilol (COREG) 12.5 MG tablet Take 12.5 mg by mouth 2 (two) times daily with a meal.    . fluticasone (FLONASE) 50 MCG/ACT nasal spray Place 2 sprays into both nostrils daily as needed for allergies or rhinitis. 18.2 g 5  . Fluticasone-Salmeterol (ADVAIR DISKUS) 250-50 MCG/DOSE AEPB Inhale 1 puff into the lungs 2 (two) times daily. 180 each 3  . hydrocortisone 2.5 % cream Apply 1 application topically at bedtime. itching     . ketoconazole (NIZORAL) 2 % cream Apply 1 application topically daily. 15 g 0  . lidocaine (LIDODERM) 5 % Apply 1 patch every 12 hrs to  affected area. Do not use more than one patch in 24 hrs. 100 patch 1  . linaclotide (LINZESS) 145 MCG CAPS capsule Take 1 capsule (145 mcg total) by mouth every other day. 30 capsule 11  . lisinopril (PRINIVIL,ZESTRIL) 2.5 MG tablet Take 1 tablet (2.5 mg total) by mouth daily. 30 tablet 5  . mometasone (ELOCON) 0.1 % ointment Apply sparingly to affected areas daily as needed 45 g 3  . niacin 500 MG tablet     . omeprazole (PRILOSEC) 20 MG capsule Take 1 capsule (20 mg total) by mouth daily with supper. 90 capsule 1  . potassium chloride SA (K-DUR,KLOR-CON) 20 MEQ tablet Take 2 tabs in AM and 1 tab in PM 270 tablet 2  . sertraline (ZOLOFT) 100 MG tablet TAKE 2 TABLETS BY MOUTH AT BEDTIME 180 tablet 3  . silver sulfADIAZINE (SILVADENE) 1 % cream APPLY TO AFFECTED AREA TOPICALLY EVERY DAY 50 g 3  . sulfamethoxazole-trimethoprim (BACTRIM DS,SEPTRA DS) 800-160 MG tablet Take 1 tablet by mouth 2 (two) times daily. 20 tablet 0  . torsemide (DEMADEX) 20 MG tablet Take 80 mg (4 Tablets) in the AM and 60 mg (3 Tablets) in the PM. 210 tablet 3  . traMADol (ULTRAM) 50 MG tablet Take 1 tablet (50 mg total) by mouth every 6 (six) hours as needed. 30 tablet 0  . traZODone (DESYREL) 100 MG tablet Take 0.5 tablets (50 mg total) by mouth at bedtime as needed.  45 tablet 1  . triamcinolone cream (KENALOG) 0.1 % Apply 1 application topically 3 (three) times daily. Apply to nail folds. 30 g 0  . TUMS 500 MG chewable tablet Chew 500-2,000 mg by mouth every 4 (four) hours as needed for indigestion or heartburn.     . warfarin (COUMADIN) 5 MG tablet Take as directed by anticoagulation clinic 30 tablet 3  . zolpidem (AMBIEN) 10 MG tablet TAKE 1 TABLET BY MOUTH AT BEDTIME 30 tablet 5  . [DISCONTINUED] atorvastatin (LIPITOR) 20 MG tablet Take 1 tablet (20 mg total) by mouth daily. 30 tablet 11  . [DISCONTINUED] gabapentin (NEURONTIN) 300 MG capsule Take 1 capsule (300 mg total) by mouth 3 (three) times daily. 90 capsule 5   No current facility-administered medications on file prior to visit.    Review of Systems  Constitutional: Negative for other unusual diaphoresis or sweats HENT: Negative for ear discharge or swelling Eyes: Negative for other worsening visual disturbances Respiratory: Negative for stridor or other swelling  Gastrointestinal: Negative for worsening distension or other blood Genitourinary: Negative for retention or other urinary change Musculoskeletal: Negative for other MSK pain or swelling Skin: Negative for color change or other new lesions Neurological: Negative for worsening tremors and other numbness  Psychiatric/Behavioral: Negative for worsening agitation or other fatigue All other system neg per pt    Objective:   Physical Exam BP 104/70   Pulse 82   Wt 261 lb (118.4 kg)   SpO2 98%   BMI 40.88 kg/m  VS noted, obese Constitutional: Pt appears in NAD HENT: Head: NCAT.  Right Ear: External ear normal.  Left Ear: External ear normal.  Left lower lip with tender swelling multiple grouped vesicles on erythem base, severe denuded Eyes: . Pupils are equal, round, and reactive to light. Conjunctivae and EOM are normal Nose: without d/c or deformity Neck: Neck supple. Gross normal ROM Cardiovascular: Normal rate and regular  rhythm.   Pulmonary/Chest: Effort normal and breath sounds without rales or wheezing.  Abd:  Soft,  NT, ND, + BS, no organomegaly Spine: diffuse low lumbar midline tender and bilat lumbar paravertebra tender without redness or swelling Neurological: Pt is alert. At baseline orientation, motor with 4+ 5 RLE weakness Skin: Skin is warm. No rashes, other new lesions, no LE edema or erythema Psychiatric: Pt behavior is normal without agitation  No other exam findings  MRI LUMBAR SPINE WITHOUT CONTRAST -  3/21 2015 summary FINDINGS: 1. At L2-3 there is a mild broad-based disc bulge with a left far lateral disc component abutting the left extra foraminal L2 nerve root. 2. At L3-4 there is a mild broad-based disc bulge with a left far lateral disc component abutting the left extra foraminal L3 nerve root. 3. At L4-L5 there is moderate bilateral facet arthropathy. 4. At L5-S1 there is a right far lateral disc osteophyte complex abutting the extra foraminal right L5 nerve root.     Assessment & Plan:

## 2017-05-15 NOTE — Patient Instructions (Signed)
Please take all new medication as prescribed - the valtrex  Please continue all other medications as before, and refills have been done if requested.  Please have the pharmacy call with any other refills you may need.  Please continue your efforts at being more active, low cholesterol diet, and weight control.  Please keep your appointments with your specialists as you may have planned  You will be contacted regarding the referral for: MRI for the lumbar spine, and Raliegh Ip

## 2017-05-16 ENCOUNTER — Other Ambulatory Visit (HOSPITAL_COMMUNITY): Payer: Self-pay

## 2017-05-16 ENCOUNTER — Other Ambulatory Visit: Payer: Self-pay | Admitting: Pharmacist

## 2017-05-16 NOTE — Patient Outreach (Signed)
[facility]) Care Management  05/16/2017  TAJE LITTLER 04/10/1956 [id number]  Call to patient to see if he received [employer] patient assistance supplied Advair or Ventolin.  Patient verified HIPAA details.  He reports he has not received medication yet---discussed with patient [facility] Pharmacist will follow-up with [place] patient assistance and call him back.   Phone call to [place] Patient Assistance---representative reports Advair was shipped 05/13/17 and Ventolin shipped 05/10/17.  Representative reports Ventolin was delivered at 1424 today and Advair is still en route to patient.  Verified with representative address shipped to was address in this chart.   Phone call back to patient, informed him of above.  He reports he will check his mail this evening and call [facility] Pharmacist back tomorrow regarding Ventolin shipment.   Plan:  Continue to follow-up with patient regarding Advair and Ventolin patient assistance.   Karrie Meres, PharmD, [place] [phone]

## 2017-05-16 NOTE — Progress Notes (Signed)
Paramedicine Encounter    Patient ID: [name], male    DOB: Nov 16, 1955, 61 y.o.   MRN: [id number]    Patient Care Team: [name], MD as PCP - General Himmelrich, Bryson Ha, RD (Inactive) as Dietitian Ruedinger, Drexel Iha, [facility] as [facility]olai Management (Pharmacist)  Patient Active Problem List   Diagnosis Date Noted  . Right lumbar radiculopathy 05/15/2017  . Thoracic back pain 05/09/2017  . Cellulitis of right leg 05/09/2017  . Right leg swelling 05/09/2017  . Right lower quadrant pain 05/09/2017  . Allergic conjunctivitis 04/18/2017  . Ingrown nail 04/18/2017  . Glaucoma 04/18/2017  . History of nasal polyposis 02/19/2017  . Chronic rhinitis 02/19/2017  . Benign localized prostatic hyperplasia with lower urinary tract symptoms (LUTS) 01/17/2017  . Acute on chronic systolic heart failure ([place])   . CAP (community acquired pneumonia) 12/31/2016  . Acute on chronic respiratory failure ([place]) 12/31/2016  . Lobar pneumonia ([place])   . Atrial fibrillation with RVR ([place])   . Microhematuria 12/04/2016  . Encounter for well adult exam with abnormal findings 11/29/2015  . Atrial fibrillation ([place]) 09/06/2015  . Chest pain 09/01/2015  . Rash 05/26/2015  . Cardiomyopathy ([place])   . Type II diabetes mellitus ([place])   . Hyperlipidemia   . Acute on chronic diastolic heart failure ([place]) 03/30/2015  . Persistent atrial fibrillation ([place]) 03/30/2015  . Syncope 03/30/2015  . Anemia 02/23/2015  . Thyrotoxicosis 02/23/2015  . Hx of adenomatous colonic polyps 10/21/2014  . Hemorrhoid 08/19/2014  . De Quervain's tenosynovitis, right 04/30/2014  . Allergic rhinitis 04/25/2014  . Osteoarthritis of right knee 02/19/2014  . Encounter for therapeutic drug monitoring 11/17/2013  . Thoracic degenerative disc disease 06/17/2013  . Thoracic spondylosis without myelopathy 02/11/2013  . Lumbosacral spondylosis without myelopathy 02/11/2013  . COPD (chronic obstructive pulmonary  disease) ([place]) 08/20/2012  . Bundle branch block, right and extreme left anterior fascicular 05/05/2012  . Nonischemic cardiomyopathy ([place]) 05/02/2012  . Anticoagulated on warfarin 03/21/2012  . Spongiotic dermatitis 09/26/2011  . Past myocardial infarction 04/19/2011  . UNSPECIFIED URETHRAL STRICTURE 12/26/2010  . Chronic pain syndrome 05/18/2010  . Obstructive sleep apnea 10/21/2007  . NEPHROLITHIASIS, HX OF 10/21/2007  . Anxiety 06/09/2007  . GERD 06/09/2007  . BENIGN PROSTATIC HYPERTROPHY 06/09/2007  . Morbid obesity ([place]) 06/03/2007  . Depression 06/03/2007  . Essential hypertension 06/03/2007  . INSOMNIA 06/03/2007    Current Outpatient Prescriptions:  .  acetaminophen (TYLENOL) 500 MG tablet, Take 500 mg by mouth every 6 (six) hours as needed., Disp: , Rfl:  .  albuterol (VENTOLIN HFA) 108 (90 Base) MCG/ACT inhaler, INHALE 2 PUFFS 4 TIMES A DAY as needed, Disp: 54 Inhaler, Rfl: 3 .  ALPRAZolam (XANAX) 0.5 MG tablet, TAKE 1 TO 2 TABLETS BY MOUTH AT BEDTIME AS NEEDED SLEEP, Disp: 180 tablet, Rfl: 1 .  aspirin-acetaminophen-caffeine (EXCEDRIN MIGRAINE) 250-250-65 MG tablet, , Disp: , Rfl:  .  carisoprodol (SOMA) 350 MG tablet, TAKE 1 TABLET BY MOUTH 3 TIMES A DAY AS NEEDED FOR MUSCLE SPASM, Disp: 90 tablet, Rfl: 5 .  carvedilol (COREG) 12.5 MG tablet, Take 12.5 mg by mouth 2 (two) times daily with a meal., Disp: , Rfl:  .  fluticasone (FLONASE) 50 MCG/ACT nasal spray, Place 2 sprays into both nostrils daily as needed for allergies or rhinitis., Disp: 18.2 g, Rfl: 5 .  Fluticasone-Salmeterol (ADVAIR DISKUS) 250-50 MCG/DOSE AEPB, Inhale 1 puff into the lungs 2 (two) times daily., Disp: 180 each, Rfl:  3 .  hydrocortisone 2.5 % cream, Apply 1 application topically at bedtime. itching , Disp: , Rfl:  .  ketoconazole (NIZORAL) 2 % cream, Apply 1 application topically daily., Disp: 15 g, Rfl: 0 .  lidocaine (LIDODERM) 5 %, Apply 1 patch every 12 hrs to affected area. Do not use more than  one patch in 24 hrs., Disp: 100 patch, Rfl: 1 .  linaclotide (LINZESS) 145 MCG CAPS capsule, Take 1 capsule (145 mcg total) by mouth every other day., Disp: 30 capsule, Rfl: 11 .  lisinopril (PRINIVIL,ZESTRIL) 2.5 MG tablet, Take 1 tablet (2.5 mg total) by mouth daily., Disp: 30 tablet, Rfl: 5 .  mometasone (ELOCON) 0.1 % ointment, Apply sparingly to affected areas daily as needed, Disp: 45 g, Rfl: 3 .  niacin 500 MG tablet, , Disp: , Rfl:  .  omeprazole (PRILOSEC) 20 MG capsule, Take 1 capsule (20 mg total) by mouth daily with supper., Disp: 90 capsule, Rfl: 1 .  potassium chloride SA (K-DUR,KLOR-CON) 20 MEQ tablet, Take 2 tabs in AM and 1 tab in PM, Disp: 270 tablet, Rfl: 2 .  sertraline (ZOLOFT) 100 MG tablet, TAKE 2 TABLETS BY MOUTH AT BEDTIME, Disp: 180 tablet, Rfl: 3 .  silver sulfADIAZINE (SILVADENE) 1 % cream, APPLY TO AFFECTED AREA TOPICALLY EVERY DAY, Disp: 50 g, Rfl: 3 .  sulfamethoxazole-trimethoprim (BACTRIM DS,SEPTRA DS) 800-160 MG tablet, Take 1 tablet by mouth 2 (two) times daily., Disp: 20 tablet, Rfl: 0 .  torsemide (DEMADEX) 20 MG tablet, Take 80 mg (4 Tablets) in the AM and 60 mg (3 Tablets) in the PM., Disp: 210 tablet, Rfl: 3 .  traMADol (ULTRAM) 50 MG tablet, Take 1 tablet (50 mg total) by mouth every 6 (six) hours as needed., Disp: 30 tablet, Rfl: 0 .  traZODone (DESYREL) 100 MG tablet, Take 0.5 tablets (50 mg total) by mouth at bedtime as needed., Disp: 45 tablet, Rfl: 1 .  triamcinolone cream (KENALOG) 0.1 %, Apply 1 application topically 3 (three) times daily. Apply to nail folds., Disp: 30 g, Rfl: 0 .  TUMS 500 MG chewable tablet, Chew 500-2,000 mg by mouth every 4 (four) hours as needed for indigestion or heartburn. , Disp: , Rfl:  .  valACYclovir (VALTREX) 1000 MG tablet, Take 1 tablet (1,000 mg total) by mouth 3 (three) times daily., Disp: 21 tablet, Rfl: 2 .  warfarin (COUMADIN) 5 MG tablet, Take as directed by anticoagulation clinic, Disp: 30 tablet, Rfl: 3 .   zolpidem (AMBIEN) 10 MG tablet, TAKE 1 TABLET BY MOUTH AT BEDTIME, Disp: 30 tablet, Rfl: 5 Allergies  Allergen Reactions  . Amiodarone Other (See Comments)    hyperthyroidism  . Statins Other (See Comments)    myalgia  . Tape Other (See Comments)    Skin Tears Use Paper Tape Only     Social History   Social History  . Marital status: Divorced    Spouse name: N/A  . Number of children: 2  . Years of education: N/A   Occupational History  . retired/disabled. prev worked in Therapist, sports. Disabled   Social History Main Topics  . Smoking status: Former Smoker    Packs/day: 2.00    Years: 30.00    Types: Cigarettes    Quit date: 10/16/2007  . Smokeless tobacco: Never Used  . Alcohol use No  . Drug use: No  . Sexual activity: Not Currently   Other Topics Concern  . Not on file   Social History Narrative  Lives alone.    Physical Exam  Pulmonary/Chest: No respiratory distress. He has no wheezes. He has no rales.  Abdominal: He exhibits no distension. There is no tenderness. There is no guarding.  Musculoskeletal: He exhibits no edema.  Skin: Skin is warm and dry. He is not diaphoretic.        SAFE - 04/25/17 1100      Situation   Heart failure history Exisiting   Comorbidities Atrial fibillation;COPD;HTN   Readmitted within 30 days No   Hospital admission within past 12 months Yes   number of hospital admissions 1   number of ED visits 1   Target Weight 240 lbs     Assessment   Lives alone No   Primary support person Melodie Mcmartin   Mode of transportation personal car   Other services involved None   Home equipement Cane;Home O2;Scale;Walker     Weight   Weighs self daily Yes   Scale provided Yes   Records on weight chart Yes     Resources   Has "Living better w/heart failure" book Yes   Has HF Zone tool Yes   Able to identify yellow zone signs/when to call MD Yes   Records zone daily Yes     Medications   Uses a pill box No   Difficulty  obtaining medications No   Mail order medications No   Missed one or more doses of medications per week No     Nutrition   Patient receives meals on wheels No   Patient follows low sodium diet Yes   Has foods at home that meet the current recommended diet Yes   Patient follows low sugar/card diet Yes     Urine   Difficulty urinating No   Changes in urine None     Time spent with patient   Time spent with patient  50 Minutes        Future Appointments Date Time Provider [place]  05/17/2017 11:00 AM Ruedinger, Drexel Iha, [place] [facility] None  05/20/2017 1:40 PM Larey Dresser, MD [facility] None  05/27/2017 1:30 PM [facility] DEVICE REMOTES [facility]  05/31/2017 3:30 PM [facility]IN [facility]  07/12/2017 9:30 AM Deboraha Sprang, MD [facility]  10/18/2017 1:30 PM [name], MD [facility]    ATF pt CAO x4 with no complaints.  he stated that he threw up his pain medication (tramadol) last night.  I advised him to cut the pill in half 25mg  to see if he could tolerate it. pt has been taking all of his medications as prescribed and hasn't missed any doses.  He's still weighing daily and writing the weights down.  He stated that he saw his primary physician yesterday whom is sending a referral to [facility] to start getting cortizone shots in his shoulder.  pt stated that the cortisone shots worked a while ago when he got them.  pt denies sob, dizziness, chest pain and headache.  rx bottles verified.  he hasn't taken his morning medications yet because he hasn't eaten.  he plans on eating when i leave.  BP 132/80 (BP Location: Left Arm, Patient Position: Sitting, Cuff Size: Normal)   Pulse (!) 54   Resp 16   Wt 259 lb 3.2 oz (117.6 kg)   SpO2 95%   BMI 40.60 kg/m   Weight yesterday-258 Last visit weight-265    Ashlynn Gunnels, EMT Paramedic 05/16/2017    ACTION: Home visit completed Next  visit planned for next  thurs

## 2017-05-17 ENCOUNTER — Other Ambulatory Visit: Payer: Self-pay | Admitting: Pharmacist

## 2017-05-17 NOTE — Patient Outreach (Signed)
[place] [facility]) Care Management  05/17/2017  [name] [birth year] [id number]  Patient called [facility] Pharmacist to report he received his shipment of three Ventolin HFA inhalers from [place] Patient Assistance yesterday.   Reminded patient per [place] patient assistance representative yesterday, Advair was shipped out 05/13/17 and he should watch for this shipment to arrive.   Requested patient call when he receives Advair.   Plan:  Continue to follow up with patient to ensure he receives Advair patient assistance.   Karrie Meres, PharmD, [place] [phone]

## 2017-05-18 DIAGNOSIS — B001 Herpesviral vesicular dermatitis: Secondary | ICD-10-CM | POA: Insufficient documentation

## 2017-05-18 NOTE — Assessment & Plan Note (Signed)
Chronic persistent, ? Worsening, has neuro change, for MRI, and refer [facility] ortho per pt request

## 2017-05-18 NOTE — Assessment & Plan Note (Signed)
C/w oral herpes, for valtrex course asd,  to f/u any worsening symptoms or concerns

## 2017-05-18 NOTE — Assessment & Plan Note (Signed)
stable overall by history and exam, compensated, and pt to continue medical treatment as before,  to f/u any worsening symptoms or concerns

## 2017-05-18 NOTE — Assessment & Plan Note (Signed)
Resolved, no further tx needed,  to f/u any worsening symptoms or concerns

## 2017-05-20 ENCOUNTER — Ambulatory Visit (HOSPITAL_COMMUNITY)
Admission: RE | Admit: 2017-05-20 | Discharge: 2017-05-20 | Disposition: A | Discharge status: Home / Self Care | Payer: Medicare HMO | Source: Ambulatory Visit | Attending: Cardiology | Admitting: Cardiology

## 2017-05-20 ENCOUNTER — Encounter (HOSPITAL_COMMUNITY): Payer: Self-pay | Admitting: Cardiology

## 2017-05-20 VITALS — BP 102/55 | HR 74 | Wt 262.0 lb

## 2017-05-20 DIAGNOSIS — I428 Other cardiomyopathies: Secondary | ICD-10-CM

## 2017-05-20 DIAGNOSIS — I5022 Chronic systolic (congestive) heart failure: Secondary | ICD-10-CM | POA: Diagnosis not present

## 2017-05-20 DIAGNOSIS — M791 Myalgia: Secondary | ICD-10-CM | POA: Insufficient documentation

## 2017-05-20 DIAGNOSIS — Z79899 Other long term (current) drug therapy: Secondary | ICD-10-CM | POA: Diagnosis not present

## 2017-05-20 DIAGNOSIS — K219 Gastro-esophageal reflux disease without esophagitis: Secondary | ICD-10-CM | POA: Diagnosis not present

## 2017-05-20 DIAGNOSIS — Z6841 Body Mass Index (BMI) 40.0 and over, adult: Secondary | ICD-10-CM | POA: Diagnosis not present

## 2017-05-20 DIAGNOSIS — Z87891 Personal history of nicotine dependence: Secondary | ICD-10-CM | POA: Diagnosis not present

## 2017-05-20 DIAGNOSIS — E785 Hyperlipidemia, unspecified: Secondary | ICD-10-CM | POA: Insufficient documentation

## 2017-05-20 DIAGNOSIS — N400 Enlarged prostate without lower urinary tract symptoms: Secondary | ICD-10-CM | POA: Diagnosis not present

## 2017-05-20 DIAGNOSIS — Z7982 Long term (current) use of aspirin: Secondary | ICD-10-CM | POA: Insufficient documentation

## 2017-05-20 DIAGNOSIS — I482 Chronic atrial fibrillation: Secondary | ICD-10-CM | POA: Insufficient documentation

## 2017-05-20 DIAGNOSIS — Z7901 Long term (current) use of anticoagulants: Secondary | ICD-10-CM | POA: Diagnosis not present

## 2017-05-20 DIAGNOSIS — I4819 Other persistent atrial fibrillation: Secondary | ICD-10-CM

## 2017-05-20 DIAGNOSIS — I481 Persistent atrial fibrillation: Secondary | ICD-10-CM

## 2017-05-20 DIAGNOSIS — I2510 Atherosclerotic heart disease of native coronary artery without angina pectoris: Secondary | ICD-10-CM | POA: Insufficient documentation

## 2017-05-20 DIAGNOSIS — J449 Chronic obstructive pulmonary disease, unspecified: Secondary | ICD-10-CM | POA: Diagnosis not present

## 2017-05-20 DIAGNOSIS — Z9981 Dependence on supplemental oxygen: Secondary | ICD-10-CM | POA: Insufficient documentation

## 2017-05-20 DIAGNOSIS — I110 Hypertensive heart disease with heart failure: Secondary | ICD-10-CM | POA: Diagnosis not present

## 2017-05-20 LAB — CBC
HEMATOCRIT: 36.4 % — AB (ref 39.0–52.0)
Hemoglobin: 12.2 g/dL — ABNORMAL LOW (ref 13.0–17.0)
MCH: 31.8 pg (ref 26.0–34.0)
MCHC: 33.5 g/dL (ref 30.0–36.0)
MCV: 94.8 fL (ref 78.0–100.0)
Platelets: 195 10*3/uL (ref 150–400)
RBC: 3.84 MIL/uL — ABNORMAL LOW (ref 4.22–5.81)
RDW: 15.4 % (ref 11.5–15.5)
WBC: 6.6 10*3/uL (ref 4.0–10.5)

## 2017-05-20 LAB — BASIC METABOLIC PANEL
ANION GAP: 11 (ref 5–15)
BUN: 47 mg/dL — ABNORMAL HIGH (ref 6–20)
CALCIUM: 9.1 mg/dL (ref 8.9–10.3)
CO2: 18 mmol/L — AB (ref 22–32)
CREATININE: 1.76 mg/dL — AB (ref 0.61–1.24)
Chloride: 108 mmol/L (ref 101–111)
GFR, EST AFRICAN AMERICAN: 46 mL/min — AB (ref >60)
GFR, EST NON AFRICAN AMERICAN: 40 mL/min — AB (ref >60)
Glucose, Bld: 97 mg/dL (ref 65–99)
Potassium: 4.5 mmol/L (ref 3.5–5.1)
SODIUM: 137 mmol/L (ref 135–145)

## 2017-05-20 LAB — BRAIN NATRIURETIC PEPTIDE: B NATRIURETIC PEPTIDE 5: 154.1 pg/mL — AB (ref 0.0–100.0)

## 2017-05-20 MED ORDER — CARVEDILOL 6.25 MG PO TABS: 9.3750 mg | ORAL_TABLET | Freq: Two times a day (BID) | ORAL | 3 refills | Status: DC

## 2017-05-20 NOTE — Patient Instructions (Signed)
Increase Carvedilol to 9.375 mg Twice daily   Labs today  You have been referred to Dr [name]  Your physician recommends that you schedule a follow-up appointment in: 2 months

## 2017-05-21 NOTE — Progress Notes (Signed)
PCP: Dr. [name] EP: [name] HF Cardiology: [name]  61 yo with history of COPD on home oxygen, permanent atrial fibrillation, and chronic systolic CHF was referred by Dr. [name] for evaluation of CHF.  Amiodarone and DCCV were tried for atrial fibrillation in the past without success.   Patient has been noted to have a low EF since 2013.  EF improved by to normal by 2015 but dropped to 30-35% by 11/16.  Cath in 6/17 showed some CAD but not enough to cause cardiomyopathy.  He was admitted in 3/18 with PNA, atrial fibrillation/RVR, and acute on chronic systolic CHF.  Since that hospitalization, he has been wearing oxygen at all times.  Echo in 3/18 showed fall in EF to 10-15%.   I took him for right and left heart cath in 4/18, which showed nonobstructive CAD and relatively optimized filling pressures.    Patient is seen in followup today. He continues to wear home oxygen for COPD.  No chest pain.  He has been sleeping in a recliner long-term.  Has OSA but cannot tolerate CPAP.  He had been very dizzy with standing a few weeks ago.  He decreased Coreg to 6.25 mg bid and this resolved.  He is short of breath after walking about 100 feet if he walks fast, this is stable.  No orthopnea/PND.  Weight down 6 lbs.   He is on a low dose of prednisone chronically.  Says he was put on this for pruritis.   Labs (4/18): pro-BNP 904, K 3.1, creatinine 1.39 => 1.08 with BUN 66 => 37, hgb 14.1.  Labs (5/18): hemoglobin 13.1 Labs (6/18): K 4, creatinine 1.3, BNP 294 Labs (7/18): K 3.8, creatinine 1.23  PMH: 1. Asthma 2. COPD: On home oxygen. Prior smoker.  3. Atrial fibrillation: Permanent.  He failed DCCV and amiodarone in the past . 4. HTN 5. GERD 6. Hyperlipidemia 7. BPH.  8. Chronic systolic CHF: Nonischemic cardiomyopathy.  - EF 25% by TEE in 7/13.  Normalized on 2015 echo. - Echo (11/16): EF 30-35%.  - LHC/RHC (6/17): 80% ostial stenosis small D1, 30-40% pLAD.  Mean RA 4, PA 26/10, mean PCWP 11, CI 2.33.   - Echo (3/18): EF 10-15%, diffuse hypokinesis, severe LV dilation, moderate RV dilation with severely decreased systolic function.  - LHC/RHC (4/18): 40% proximal LAD.  Mean RA 8, PA 37/10 mean 22, mean PCWP 22, LVEDP 9, CI 2.39. 9. Morbid obesity 10. ILR in place.   Social History   Social History  . Marital status: Divorced    Spouse name: N/A  . Number of children: 2  . Years of education: N/A   Occupational History  . retired/disabled. prev worked in Therapist, sports. Disabled   Social History Main Topics  . Smoking status: Former Smoker    Packs/day: 2.00    Years: 30.00    Types: Cigarettes    Quit date: 10/16/2007  . Smokeless tobacco: Never Used  . Alcohol use No  . Drug use: No  . Sexual activity: Not Currently   Other Topics Concern  . Not on file   Social History Narrative   Lives alone.   Family History  Problem Relation Age of Onset  . COPD Mother   . Asthma Mother   . Colon polyps Mother   . Allergies Mother   . Hypothyroidism Mother   . Asthma Maternal Grandmother   . Colon cancer Neg Hx    ROS: All systems reviewed and negative except as per  HPI.   Current Outpatient Prescriptions  Medication Sig Dispense Refill  . acetaminophen (TYLENOL) 500 MG tablet Take 500 mg by mouth every 6 (six) hours as needed.    [name] albuterol (VENTOLIN HFA) 108 (90 Base) MCG/ACT inhaler INHALE 2 PUFFS 4 TIMES A DAY as needed 54 Inhaler 3  . ALPRAZolam (XANAX) 0.5 MG tablet TAKE 1 TO 2 TABLETS BY MOUTH AT BEDTIME AS NEEDED SLEEP 180 tablet 1  . aspirin-acetaminophen-caffeine (EXCEDRIN MIGRAINE) 250-250-65 MG tablet     . carisoprodol (SOMA) 350 MG tablet TAKE 1 TABLET BY MOUTH 3 TIMES A DAY AS NEEDED FOR MUSCLE SPASM 90 tablet 5  . carvedilol (COREG) 6.25 MG tablet Take 1.5 tablets (9.375 mg total) by mouth 2 (two) times daily with a meal. 90 tablet 3  . fluticasone (FLONASE) 50 MCG/ACT nasal spray Place 2 sprays into both nostrils daily as needed for allergies or  rhinitis. 18.2 g 5  . Fluticasone-Salmeterol (ADVAIR DISKUS) 250-50 MCG/DOSE AEPB Inhale 1 puff into the lungs 2 (two) times daily. 180 each 3  . hydrocortisone 2.5 % cream Apply 1 application topically at bedtime. itching     . ketoconazole (NIZORAL) 2 % cream Apply 1 application topically daily. 15 g 0  . lidocaine (LIDODERM) 5 % Apply 1 patch every 12 hrs to affected area. Do not use more than one patch in 24 hrs. 100 patch 1  . linaclotide (LINZESS) 145 MCG CAPS capsule Take 1 capsule (145 mcg total) by mouth every other day. 30 capsule 11  . lisinopril (PRINIVIL,ZESTRIL) 2.5 MG tablet Take 1 tablet (2.5 mg total) by mouth daily. 30 tablet 5  . mometasone (ELOCON) 0.1 % ointment Apply sparingly to affected areas daily as needed 45 g 3  . niacin 500 MG tablet     . omeprazole (PRILOSEC) 20 MG capsule Take 1 capsule (20 mg total) by mouth daily with supper. 90 capsule 1  . potassium chloride SA (K-DUR,KLOR-CON) 20 MEQ tablet Take 2 tabs in AM and 1 tab in PM 270 tablet 2  . sertraline (ZOLOFT) 100 MG tablet TAKE 2 TABLETS BY MOUTH AT BEDTIME 180 tablet 3  . silver sulfADIAZINE (SILVADENE) 1 % cream APPLY TO AFFECTED AREA TOPICALLY EVERY DAY 50 g 3  . sulfamethoxazole-trimethoprim (BACTRIM DS,SEPTRA DS) 800-160 MG tablet Take 1 tablet by mouth 2 (two) times daily. 20 tablet 0  . torsemide (DEMADEX) 20 MG tablet Take 80 mg (4 Tablets) in the AM and 60 mg (3 Tablets) in the PM. 210 tablet 3  . traMADol (ULTRAM) 50 MG tablet Take 1 tablet (50 mg total) by mouth every 6 (six) hours as needed. 30 tablet 0  . traZODone (DESYREL) 100 MG tablet Take 0.5 tablets (50 mg total) by mouth at bedtime as needed. 45 tablet 1  . triamcinolone cream (KENALOG) 0.1 % Apply 1 application topically 3 (three) times daily. Apply to nail folds. 30 g 0  . TUMS 500 MG chewable tablet Chew 500-2,000 mg by mouth every 4 (four) hours as needed for indigestion or heartburn.     . valACYclovir (VALTREX) 1000 MG tablet Take 1  tablet (1,000 mg total) by mouth 3 (three) times daily. 21 tablet 2  . warfarin (COUMADIN) 5 MG tablet Take as directed by anticoagulation clinic 30 tablet 3  . zolpidem (AMBIEN) 10 MG tablet TAKE 1 TABLET BY MOUTH AT BEDTIME 30 tablet 5   No current facility-administered medications for this encounter.    BP (!) 102/55   Pulse  74   Wt 262 lb (118.8 kg)   SpO2 98%   BMI 41.04 kg/m  General: NAD Neck: Thick, no JVD, no thyromegaly or thyroid nodule.  Lungs: Clear to auscultation bilaterally with normal respiratory effort. CV: Nondisplaced PMI.  Heart irregular S1/S2, no S3/S4, no murmur.  No peripheral edema.  No carotid bruit.  Normal pedal pulses.  Abdomen: Soft, nontender, no hepatosplenomegaly, no distention.  Skin: Intact without lesions or rashes.  Neurologic: Alert and oriented x 3.  Psych: Normal affect. Extremities: No clubbing or cyanosis.  HEENT: Normal.   Assessment/Plan:  1. Atrial fibrillation: Permanent, good rate control.   - Continue Coreg and warfarin.  2. COPD: No longer smoking.  He is on home oxygen.   3. Chronic systolic CHF: Nonischemic cardiomyopathy based on 6/17 cath. However, he had a recent significant fall in EF from 30-35% to 10-15% on last echo from 3/18.  LHC/RHC in 4/18 showed nonobstructive CAD and relatively optimized filling pressures.  Weight is down but he still has prominent fatigue and exertional dyspnea.  He does not appear volume overloaded on exam.  NYHA class III symptoms. - Will try increasing Coreg to 9.375 mg bid, still lower than prior dose of 12.5 mg bid which seems to have made him orthostatic.  - Spironolactone stopped with low BP.   - Continue current torsemide, check BMET/BNP.     - Continue low dose lisinopril in the evening.   - He qualifies for an ICD at this point with long-term low EF.  Nonischemic, so not as marked a benefit but age < 46.  Probably would not benefit much from AV nodal ablation/BiV pacing with controlled rate  and RBBB.  I will send him back to Dr. [name] for ICD evaluation. 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  3. COPD: Continue home oxygen.  4. CAD: Nonobstructive on 4/18 cath.  No exertional chest pain.  - He is on warfarin so no ASA.  - Myalgias with multiple statins, unable to tolerate.   Followup in 2 months.   Loralie Champagne 05/21/2017

## 2017-05-22 ENCOUNTER — Telehealth (HOSPITAL_COMMUNITY): Payer: Self-pay | Admitting: *Deleted

## 2017-05-22 MED ORDER — TORSEMIDE 20 MG PO TABS: ORAL_TABLET | ORAL | 3 refills | Status: DC

## 2017-05-22 NOTE — Telephone Encounter (Signed)
-----   Message from Larey Dresser, MD sent at 05/21/2017  5:08 PM EDT ----- Creatinine higher.  Would decrease torsemide to 60 qam/40 qpm for now.  Have him repeat BMET in 1 week and call if weight starts to go up.

## 2017-05-23 ENCOUNTER — Other Ambulatory Visit: Payer: Self-pay | Admitting: Pharmacist

## 2017-05-23 NOTE — Patient Outreach (Signed)
[facility] [facility]) Care Management  05/23/2017  [name] 06-19-1956 [id number]  Unsuccessful phone outreach to patient.  HIPAA compliant message left requesting return call.   Plan:  If no return call, will make another outreach attempt to patient next week.   Karrie Meres, PharmD, [place] [phone]

## 2017-05-24 DIAGNOSIS — J449 Chronic obstructive pulmonary disease, unspecified: Secondary | ICD-10-CM | POA: Diagnosis not present

## 2017-05-27 ENCOUNTER — Ambulatory Visit (INDEPENDENT_AMBULATORY_CARE_PROVIDER_SITE_OTHER): Payer: Medicare HMO | Admitting: *Deleted

## 2017-05-27 ENCOUNTER — Encounter: Payer: Self-pay | Admitting: Pharmacist

## 2017-05-27 ENCOUNTER — Other Ambulatory Visit: Payer: Self-pay | Admitting: Pharmacist

## 2017-05-27 DIAGNOSIS — I4891 Unspecified atrial fibrillation: Secondary | ICD-10-CM | POA: Diagnosis not present

## 2017-05-27 NOTE — Patient Outreach (Signed)
[place] [facility]) Care Management  05/27/2017  [name] 1956/09/02 [id number]  1059:  Received message from patient he received his Advair patient assistance from [place] Patient Assistance program.   Attempted to return call, no answer, HIPAA compliant message left for patient.    1137:  Patient returned call.  He confirms he has received both Ventolin and Advair inhalers from [place] Patient Assistance.  He reports he is aware he is eligible for manufacturer patient assistance for these medications until 10/14/17.    He denies other pharmacy related needs at this time and is appreciative of assistance.    Plan:  Patient confirms he has [facility] Pharmacist phone number should new pharmacy needs arise.   Will update Dr Marshall Cork, PCP, patient was approved for manufacturer patient assistance.    Will close pharmacy case.   Karrie Meres, PharmD, [place] [phone]

## 2017-05-28 ENCOUNTER — Other Ambulatory Visit (HOSPITAL_COMMUNITY): Payer: Self-pay

## 2017-05-28 ENCOUNTER — Ambulatory Visit (HOSPITAL_COMMUNITY): Payer: Medicare HMO

## 2017-05-28 DIAGNOSIS — M546 Pain in thoracic spine: Secondary | ICD-10-CM | POA: Diagnosis not present

## 2017-05-28 DIAGNOSIS — M542 Cervicalgia: Secondary | ICD-10-CM | POA: Diagnosis not present

## 2017-05-28 NOTE — Progress Notes (Signed)
Paramedicine Encounter    Patient ID: [name], male    DOB: 01-04-1956, 61 y.o.   MRN: [id number]    Patient Care Team: [name], MD as PCP - General Himmelrich, Bryson Ha, RD (Inactive) as Dietitian  Patient Active Problem List   Diagnosis Date Noted  . Cold sore 05/18/2017  . Right lumbar radiculopathy 05/15/2017  . Thoracic back pain 05/09/2017  . Cellulitis of right leg 05/09/2017  . Right leg swelling 05/09/2017  . Right lower quadrant pain 05/09/2017  . Allergic conjunctivitis 04/18/2017  . Ingrown nail 04/18/2017  . Glaucoma 04/18/2017  . History of nasal polyposis 02/19/2017  . Chronic rhinitis 02/19/2017  . Benign localized prostatic hyperplasia with lower urinary tract symptoms (LUTS) 01/17/2017  . Acute on chronic systolic heart failure ([place])   . CAP (community acquired pneumonia) 12/31/2016  . Acute on chronic respiratory failure ([place]) 12/31/2016  . Lobar pneumonia ([place])   . Atrial fibrillation with RVR ([place])   . Microhematuria 12/04/2016  . Encounter for well adult exam with abnormal findings 11/29/2015  . Atrial fibrillation ([place]) 09/06/2015  . Chest pain 09/01/2015  . Rash 05/26/2015  . Cardiomyopathy ([place])   . Type II diabetes mellitus ([place])   . Hyperlipidemia   . Acute on chronic diastolic heart failure ([place]) 03/30/2015  . Persistent atrial fibrillation ([place]) 03/30/2015  . Syncope 03/30/2015  . Anemia 02/23/2015  . Thyrotoxicosis 02/23/2015  . Hx of adenomatous colonic polyps 10/21/2014  . Hemorrhoid 08/19/2014  . De Quervain's tenosynovitis, right 04/30/2014  . Allergic rhinitis 04/25/2014  . Osteoarthritis of right knee 02/19/2014  . Encounter for therapeutic drug monitoring 11/17/2013  . Thoracic degenerative disc disease 06/17/2013  . Thoracic spondylosis without myelopathy 02/11/2013  . Lumbosacral spondylosis without myelopathy 02/11/2013  . COPD (chronic obstructive pulmonary disease) ([place]) 08/20/2012  . Bundle branch block, right and  extreme left anterior fascicular 05/05/2012  . Nonischemic cardiomyopathy ([place]) 05/02/2012  . Anticoagulated on warfarin 03/21/2012  . Spongiotic dermatitis 09/26/2011  . Past myocardial infarction 04/19/2011  . UNSPECIFIED URETHRAL STRICTURE 12/26/2010  . Chronic pain syndrome 05/18/2010  . Obstructive sleep apnea 10/21/2007  . NEPHROLITHIASIS, HX OF 10/21/2007  . Anxiety 06/09/2007  . GERD 06/09/2007  . BENIGN PROSTATIC HYPERTROPHY 06/09/2007  . Morbid obesity ([place]) 06/03/2007  . Depression 06/03/2007  . Essential hypertension 06/03/2007  . INSOMNIA 06/03/2007    Current Outpatient Prescriptions:  .  ALPRAZolam (XANAX) 0.5 MG tablet, TAKE 1 TO 2 TABLETS BY MOUTH AT BEDTIME AS NEEDED SLEEP, Disp: 180 tablet, Rfl: 1 .  carisoprodol (SOMA) 350 MG tablet, TAKE 1 TABLET BY MOUTH 3 TIMES A DAY AS NEEDED FOR MUSCLE SPASM, Disp: 90 tablet, Rfl: 5 .  carvedilol (COREG) 6.25 MG tablet, Take 1.5 tablets (9.375 mg total) by mouth 2 (two) times daily with a meal., Disp: 90 tablet, Rfl: 3 .  lisinopril (PRINIVIL,ZESTRIL) 2.5 MG tablet, Take 1 tablet (2.5 mg total) by mouth daily., Disp: 30 tablet, Rfl: 5 .  omeprazole (PRILOSEC) 20 MG capsule, Take 1 capsule (20 mg total) by mouth daily with supper., Disp: 90 capsule, Rfl: 1 .  potassium chloride SA (K-DUR,KLOR-CON) 20 MEQ tablet, Take 2 tabs in AM and 1 tab in PM, Disp: 270 tablet, Rfl: 2 .  sertraline (ZOLOFT) 100 MG tablet, TAKE 2 TABLETS BY MOUTH AT BEDTIME, Disp: 180 tablet, Rfl: 3 .  acetaminophen (TYLENOL) 500 MG tablet, Take 500 mg by mouth every 6 (six) hours as needed., Disp: , Rfl:  .  albuterol (VENTOLIN HFA) 108 (90 Base) MCG/ACT inhaler, INHALE 2 PUFFS 4 TIMES A DAY as needed, Disp: 54 Inhaler, Rfl: 3 .  aspirin-acetaminophen-caffeine (EXCEDRIN MIGRAINE) 250-250-65 MG tablet, , Disp: , Rfl:  .  fluticasone (FLONASE) 50 MCG/ACT nasal spray, Place 2 sprays into both nostrils daily as needed for allergies or rhinitis., Disp: 18.2 g, Rfl:  5 .  Fluticasone-Salmeterol (ADVAIR DISKUS) 250-50 MCG/DOSE AEPB, Inhale 1 puff into the lungs 2 (two) times daily., Disp: 180 each, Rfl: 3 .  hydrocortisone 2.5 % cream, Apply 1 application topically at bedtime. itching , Disp: , Rfl:  .  ketoconazole (NIZORAL) 2 % cream, Apply 1 application topically daily., Disp: 15 g, Rfl: 0 .  lidocaine (LIDODERM) 5 %, Apply 1 patch every 12 hrs to affected area. Do not use more than one patch in 24 hrs., Disp: 100 patch, Rfl: 1 .  linaclotide (LINZESS) 145 MCG CAPS capsule, Take 1 capsule (145 mcg total) by mouth every other day., Disp: 30 capsule, Rfl: 11 .  mometasone (ELOCON) 0.1 % ointment, Apply sparingly to affected areas daily as needed, Disp: 45 g, Rfl: 3 .  niacin 500 MG tablet, , Disp: , Rfl:  .  silver sulfADIAZINE (SILVADENE) 1 % cream, APPLY TO AFFECTED AREA TOPICALLY EVERY DAY, Disp: 50 g, Rfl: 3 .  sulfamethoxazole-trimethoprim (BACTRIM DS,SEPTRA DS) 800-160 MG tablet, Take 1 tablet by mouth 2 (two) times daily., Disp: 20 tablet, Rfl: 0 .  torsemide (DEMADEX) 20 MG tablet, Take 60 mg (3 Tablets) in the AM and 40 mg (2 Tablets) in the PM., Disp: 150 tablet, Rfl: 3 .  traMADol (ULTRAM) 50 MG tablet, Take 1 tablet (50 mg total) by mouth every 6 (six) hours as needed., Disp: 30 tablet, Rfl: 0 .  traZODone (DESYREL) 100 MG tablet, Take 0.5 tablets (50 mg total) by mouth at bedtime as needed., Disp: 45 tablet, Rfl: 1 .  triamcinolone cream (KENALOG) 0.1 %, Apply 1 application topically 3 (three) times daily. Apply to nail folds., Disp: 30 g, Rfl: 0 .  TUMS 500 MG chewable tablet, Chew 500-2,000 mg by mouth every 4 (four) hours as needed for indigestion or heartburn. , Disp: , Rfl:  .  valACYclovir (VALTREX) 1000 MG tablet, Take 1 tablet (1,000 mg total) by mouth 3 (three) times daily., Disp: 21 tablet, Rfl: 2 .  warfarin (COUMADIN) 5 MG tablet, Take as directed by anticoagulation clinic, Disp: 30 tablet, Rfl: 3 .  zolpidem (AMBIEN) 10 MG tablet, TAKE  1 TABLET BY MOUTH AT BEDTIME, Disp: 30 tablet, Rfl: 5 Allergies  Allergen Reactions  . Amiodarone Other (See Comments)    hyperthyroidism  . Statins Other (See Comments)    myalgia  . Tape Other (See Comments)    Skin Tears Use Paper Tape Only     Social History   Social History  . Marital status: Divorced    Spouse name: N/A  . Number of children: 2  . Years of education: N/A   Occupational History  . retired/disabled. prev worked in Therapist, sports. Disabled   Social History Main Topics  . Smoking status: Former Smoker    Packs/day: 2.00    Years: 30.00    Types: Cigarettes    Quit date: 10/16/2007  . Smokeless tobacco: Never Used  . Alcohol use No  . Drug use: No  . Sexual activity: Not Currently   Other Topics Concern  . Not on file   Social History Narrative   Lives alone.  Physical Exam  Pulmonary/Chest: No respiratory distress.  Abdominal: He exhibits no distension. There is no rebound and no guarding.  Musculoskeletal: He exhibits no edema.  Skin: Skin is warm and dry. He is not diaphoretic.        SAFE - 04/25/17 1100      Situation   Heart failure history Exisiting   Comorbidities Atrial fibillation;COPD;HTN   Readmitted within 30 days No   Hospital admission within past 12 months Yes   number of hospital admissions 1   number of ED visits 1   Target Weight 240 lbs     Assessment   Lives alone No   Primary support person Melodie Cazarez   Mode of transportation personal car   Other services involved None   Home equipement Cane;Home O2;Scale;Walker     Weight   Weighs self daily Yes   Scale provided Yes   Records on weight chart Yes     Resources   Has "Living better w/heart failure" book Yes   Has HF Zone tool Yes   Able to identify yellow zone signs/when to call MD Yes   Records zone daily Yes     Medications   Uses a pill box No   Difficulty obtaining medications No   Mail order medications No   Missed one or more doses of  medications per week No     Nutrition   Patient receives meals on wheels No   Patient follows low sodium diet Yes   Has foods at home that meet the current recommended diet Yes   Patient follows low sugar/card diet Yes     Urine   Difficulty urinating No   Changes in urine None     Time spent with patient   Time spent with patient  50 Minutes        Future Appointments Date Time Provider [place]  05/29/2017 10:30 AM Ruedinger, Drexel Iha, RPH [facility] None  05/30/2017 11:15 AM [facility] None  05/31/2017 3:30 PM [facility]  06/03/2017 1:00 PM [facility] 1 [facility]  07/12/2017 9:30 AM Deboraha Sprang, MD [facility]  07/25/2017 11:00 AM Larey Dresser, MD [facility] None  10/18/2017 1:30 PM [name], MD [facility]    ATF pt CAO x4 standing in the kitchen washing dishes. Pt has an appointment today to see the orthopedic for back and shoulder pain.  He stated that he is getting a shot in both.  He denies sob, dizziness, and headache today.  Pt has had dizziness this week after standing up but it goes away quickly.  He had just taken his meds prior to my arrival.  Pt is taking his meds as prescribed. Pt reports that Dr. [name] decreased his torsemide to 2 am 1 pm; carvedilol to 9.75mg . rx bottles verified. Pt is still not using the pill box but he can account for his meds and how to take them.      BP 96/70 (BP Location: Left Arm, Patient Position: Sitting, Cuff Size: Normal)   Pulse (!) 50   Resp 12   Wt 260 lb (117.9 kg)   SpO2 98%   BMI 40.72 kg/m   Weight yesterday-261 Last visit weight-259    Darlen Gledhill, EMT Paramedic 05/28/2017    ACTION: Home visit completed

## 2017-05-29 ENCOUNTER — Other Ambulatory Visit: Payer: Self-pay | Admitting: Internal Medicine

## 2017-05-29 ENCOUNTER — Ambulatory Visit: Payer: Self-pay | Admitting: Pharmacist

## 2017-05-29 DIAGNOSIS — M546 Pain in thoracic spine: Secondary | ICD-10-CM

## 2017-05-29 NOTE — Telephone Encounter (Signed)
Received note per Dr Lynann Bologna to consider change MRI to thoracic as pt pain currenlty mid right thoracic, and this is done

## 2017-05-30 ENCOUNTER — Inpatient Hospital Stay (HOSPITAL_COMMUNITY): Admission: RE | Admit: 2017-05-30 | Payer: Medicare HMO | Source: Ambulatory Visit

## 2017-05-31 ENCOUNTER — Ambulatory Visit (INDEPENDENT_AMBULATORY_CARE_PROVIDER_SITE_OTHER): Payer: Medicare HMO | Admitting: General Practice

## 2017-05-31 ENCOUNTER — Encounter (HOSPITAL_COMMUNITY): Payer: Medicare HMO | Admitting: Cardiology

## 2017-05-31 DIAGNOSIS — Z5181 Encounter for therapeutic drug level monitoring: Secondary | ICD-10-CM | POA: Diagnosis not present

## 2017-05-31 LAB — POCT INR: INR: 3.2

## 2017-05-31 NOTE — Patient Instructions (Signed)
Pre visit review using our clinic review tool, if applicable. No additional management support is needed unless otherwise documented below in the visit note. 

## 2017-06-01 LAB — CUP PACEART REMOTE DEVICE CHECK
Implantable Pulse Generator Implant Date: 20160622
MDC IDC SESS DTM: 20180812064231

## 2017-06-01 NOTE — Progress Notes (Signed)
Carelink summary report received. Battery status OK. Normal device function. No tachy episodes, brady, or pause episodes. 3 AF 99.9% +warfarin. 1 symptom episode appears AF w/ bursts of RVR. Monthly summary reports and ROV/PRN

## 2017-06-03 ENCOUNTER — Other Ambulatory Visit (HOSPITAL_COMMUNITY): Payer: Self-pay | Admitting: *Deleted

## 2017-06-03 ENCOUNTER — Encounter: Payer: Self-pay | Admitting: Internal Medicine

## 2017-06-03 ENCOUNTER — Other Ambulatory Visit: Payer: Self-pay | Admitting: Internal Medicine

## 2017-06-03 ENCOUNTER — Ambulatory Visit (HOSPITAL_COMMUNITY)
Admission: RE | Admit: 2017-06-03 | Discharge: 2017-06-03 | Disposition: A | Discharge status: Home / Self Care | Payer: Medicare HMO | Source: Ambulatory Visit | Attending: Cardiology | Admitting: Cardiology

## 2017-06-03 ENCOUNTER — Ambulatory Visit (HOSPITAL_COMMUNITY)
Admission: RE | Admit: 2017-06-03 | Discharge: 2017-06-03 | Disposition: A | Discharge status: Home / Self Care | Payer: Medicare HMO | Source: Ambulatory Visit | Attending: Internal Medicine | Admitting: Internal Medicine

## 2017-06-03 DIAGNOSIS — Z981 Arthrodesis status: Secondary | ICD-10-CM | POA: Insufficient documentation

## 2017-06-03 DIAGNOSIS — M546 Pain in thoracic spine: Secondary | ICD-10-CM

## 2017-06-03 DIAGNOSIS — I5022 Chronic systolic (congestive) heart failure: Secondary | ICD-10-CM

## 2017-06-03 LAB — BASIC METABOLIC PANEL
ANION GAP: 8 (ref 5–15)
BUN: 40 mg/dL — AB (ref 6–20)
CHLORIDE: 110 mmol/L (ref 101–111)
CO2: 24 mmol/L (ref 22–32)
Calcium: 9 mg/dL (ref 8.9–10.3)
Creatinine, Ser: 1.22 mg/dL (ref 0.61–1.24)
Glucose, Bld: 95 mg/dL (ref 65–99)
POTASSIUM: 4.5 mmol/L (ref 3.5–5.1)
SODIUM: 142 mmol/L (ref 135–145)

## 2017-06-04 ENCOUNTER — Encounter: Payer: Self-pay | Admitting: Internal Medicine

## 2017-06-05 ENCOUNTER — Ambulatory Visit: Payer: Medicare HMO | Admitting: Internal Medicine

## 2017-06-05 ENCOUNTER — Other Ambulatory Visit (HOSPITAL_COMMUNITY): Payer: Self-pay

## 2017-06-05 NOTE — Progress Notes (Signed)
Paramedicine Encounter    Patient ID: [name], male    DOB: [birth year], 61 y.o.   MRN: [id number]    Patient Care Team: [name], MD as PCP - General Himmelrich, Bryson Ha, RD (Inactive) as Dietitian  Patient Active Problem List   Diagnosis Date Noted  . Cold sore 05/18/2017  . Right lumbar radiculopathy 05/15/2017  . Thoracic back pain 05/09/2017  . Cellulitis of right leg 05/09/2017  . Right leg swelling 05/09/2017  . Right lower quadrant pain 05/09/2017  . Allergic conjunctivitis 04/18/2017  . Ingrown nail 04/18/2017  . Glaucoma 04/18/2017  . History of nasal polyposis 02/19/2017  . Chronic rhinitis 02/19/2017  . Benign localized prostatic hyperplasia with lower urinary tract symptoms (LUTS) 01/17/2017  . Acute on chronic systolic heart failure ([place])   . CAP (community acquired pneumonia) 12/31/2016  . Acute on chronic respiratory failure ([place]) 12/31/2016  . Lobar pneumonia ([place])   . Atrial fibrillation with RVR ([place])   . Microhematuria 12/04/2016  . Encounter for well adult exam with abnormal findings 11/29/2015  . Atrial fibrillation ([place]) 09/06/2015  . Chest pain 09/01/2015  . Rash 05/26/2015  . Cardiomyopathy ([place])   . Type II diabetes mellitus ([place])   . Hyperlipidemia   . Acute on chronic diastolic heart failure ([place]) 03/30/2015  . Persistent atrial fibrillation ([place]) 03/30/2015  . Syncope 03/30/2015  . Anemia 02/23/2015  . Thyrotoxicosis 02/23/2015  . Hx of adenomatous colonic polyps 10/21/2014  . Hemorrhoid 08/19/2014  . De Quervain's tenosynovitis, right 04/30/2014  . Allergic rhinitis 04/25/2014  . Osteoarthritis of right knee 02/19/2014  . Encounter for therapeutic drug monitoring 11/17/2013  . Thoracic degenerative disc disease 06/17/2013  . Thoracic spondylosis without myelopathy 02/11/2013  . Lumbosacral spondylosis without myelopathy 02/11/2013  . COPD (chronic obstructive pulmonary disease) ([place]) 08/20/2012  . Bundle branch block, right and  extreme left anterior fascicular 05/05/2012  . Nonischemic cardiomyopathy ([place]) 05/02/2012  . Anticoagulated on warfarin 03/21/2012  . Spongiotic dermatitis 09/26/2011  . Past myocardial infarction 04/19/2011  . UNSPECIFIED URETHRAL STRICTURE 12/26/2010  . Chronic pain syndrome 05/18/2010  . Obstructive sleep apnea 10/21/2007  . NEPHROLITHIASIS, HX OF 10/21/2007  . Anxiety 06/09/2007  . GERD 06/09/2007  . BENIGN PROSTATIC HYPERTROPHY 06/09/2007  . Morbid obesity ([place]) 06/03/2007  . Depression 06/03/2007  . Essential hypertension 06/03/2007  . INSOMNIA 06/03/2007    Current Outpatient Prescriptions:  .  acetaminophen (TYLENOL) 500 MG tablet, Take 500 mg by mouth every 6 (six) hours as needed., Disp: , Rfl:  .  albuterol (VENTOLIN HFA) 108 (90 Base) MCG/ACT inhaler, INHALE 2 PUFFS 4 TIMES A DAY as needed, Disp: 54 Inhaler, Rfl: 3 .  ALPRAZolam (XANAX) 0.5 MG tablet, TAKE 1 TO 2 TABLETS BY MOUTH AT BEDTIME AS NEEDED SLEEP, Disp: 180 tablet, Rfl: 1 .  aspirin-acetaminophen-caffeine (EXCEDRIN MIGRAINE) 250-250-65 MG tablet, , Disp: , Rfl:  .  carisoprodol (SOMA) 350 MG tablet, TAKE 1 TABLET BY MOUTH 3 TIMES A DAY AS NEEDED FOR MUSCLE SPASM, Disp: 90 tablet, Rfl: 5 .  carvedilol (COREG) 6.25 MG tablet, Take 1.5 tablets (9.375 mg total) by mouth 2 (two) times daily with a meal., Disp: 90 tablet, Rfl: 3 .  fluticasone (FLONASE) 50 MCG/ACT nasal spray, Place 2 sprays into both nostrils daily as needed for allergies or rhinitis., Disp: 18.2 g, Rfl: 5 .  Fluticasone-Salmeterol (ADVAIR DISKUS) 250-50 MCG/DOSE AEPB, Inhale 1 puff into the lungs 2 (two) times daily., Disp: 180 each, Rfl: 3 .  hydrocortisone 2.5 % cream, Apply 1 application topically at bedtime. itching , Disp: , Rfl:  .  ketoconazole (NIZORAL) 2 % cream, Apply 1 application topically daily., Disp: 15 g, Rfl: 0 .  lidocaine (LIDODERM) 5 %, APPLY 1 PATCH TO AFFECTED AREA FOR UP TO 12 HOURS . DO NOT USE MORE THAN ONE PATCH IN 24 HOURS,  Disp: 90 patch, Rfl: 1 .  linaclotide (LINZESS) 145 MCG CAPS capsule, Take 1 capsule (145 mcg total) by mouth every other day., Disp: 30 capsule, Rfl: 11 .  lisinopril (PRINIVIL,ZESTRIL) 2.5 MG tablet, Take 1 tablet (2.5 mg total) by mouth daily., Disp: 30 tablet, Rfl: 5 .  mometasone (ELOCON) 0.1 % ointment, Apply sparingly to affected areas daily as needed, Disp: 45 g, Rfl: 3 .  niacin 500 MG tablet, , Disp: , Rfl:  .  omeprazole (PRILOSEC) 20 MG capsule, Take 1 capsule (20 mg total) by mouth daily with supper., Disp: 90 capsule, Rfl: 1 .  potassium chloride SA (K-DUR,KLOR-CON) 20 MEQ tablet, Take 2 tabs in AM and 1 tab in PM, Disp: 270 tablet, Rfl: 2 .  sertraline (ZOLOFT) 100 MG tablet, TAKE 2 TABLETS BY MOUTH AT BEDTIME, Disp: 180 tablet, Rfl: 3 .  silver sulfADIAZINE (SILVADENE) 1 % cream, APPLY TO AFFECTED AREA TOPICALLY EVERY DAY, Disp: 50 g, Rfl: 3 .  sulfamethoxazole-trimethoprim (BACTRIM DS,SEPTRA DS) 800-160 MG tablet, Take 1 tablet by mouth 2 (two) times daily., Disp: 20 tablet, Rfl: 0 .  torsemide (DEMADEX) 20 MG tablet, Take 60 mg (3 Tablets) in the AM and 40 mg (2 Tablets) in the PM., Disp: 150 tablet, Rfl: 3 .  traMADol (ULTRAM) 50 MG tablet, Take 1 tablet (50 mg total) by mouth every 6 (six) hours as needed., Disp: 30 tablet, Rfl: 0 .  traZODone (DESYREL) 100 MG tablet, Take 0.5 tablets (50 mg total) by mouth at bedtime as needed., Disp: 45 tablet, Rfl: 1 .  triamcinolone cream (KENALOG) 0.1 %, Apply 1 application topically 3 (three) times daily. Apply to nail folds., Disp: 30 g, Rfl: 0 .  TUMS 500 MG chewable tablet, Chew 500-2,000 mg by mouth every 4 (four) hours as needed for indigestion or heartburn. , Disp: , Rfl:  .  valACYclovir (VALTREX) 1000 MG tablet, Take 1 tablet (1,000 mg total) by mouth 3 (three) times daily., Disp: 21 tablet, Rfl: 2 .  warfarin (COUMADIN) 5 MG tablet, Take as directed by anticoagulation clinic, Disp: 30 tablet, Rfl: 3 .  zolpidem (AMBIEN) 10 MG  tablet, TAKE 1 TABLET BY MOUTH AT BEDTIME, Disp: 30 tablet, Rfl: 5 Allergies  Allergen Reactions  . Amiodarone Other (See Comments)    hyperthyroidism  . Statins Other (See Comments)    myalgia  . Tape Other (See Comments)    Skin Tears Use Paper Tape Only     Social History   Social History  . Marital status: Divorced    Spouse name: N/A  . Number of children: 2  . Years of education: N/A   Occupational History  . retired/disabled. prev worked in Therapist, sports. Disabled   Social History Main Topics  . Smoking status: Former Smoker    Packs/day: 2.00    Years: 30.00    Types: Cigarettes    Quit date: 10/16/2007  . Smokeless tobacco: Never Used  . Alcohol use No  . Drug use: No  . Sexual activity: Not Currently   Other Topics Concern  . Not on file   Social History Narrative  Lives alone.    Physical Exam  Pulmonary/Chest: No respiratory distress.  Abdominal: He exhibits no distension. There is no tenderness. There is no guarding.  Musculoskeletal: He exhibits no edema.  Skin: Skin is warm and dry. He is not diaphoretic.        SAFE - 04/25/17 1100      Situation   Heart failure history Exisiting   Comorbidities Atrial fibillation;COPD;HTN   Readmitted within 30 days No   Hospital admission within past 12 months Yes   number of hospital admissions 1   number of ED visits 1   [facility] Weight 240 lbs     Assessment   Lives alone No   Primary support person Melodie Morino   Mode of transportation personal car   Other services involved None   Home equipement Cane;Home O2;Scale;Walker     Weight   Weighs self daily Yes   Scale provided Yes   Records on weight chart Yes     Resources   Has "Living better w/heart failure" book Yes   Has HF Zone tool Yes   Able to identify yellow zone signs/when to call MD Yes   Records zone daily Yes     Medications   Uses a pill box No   Difficulty obtaining medications No   Mail order medications No   Missed  one or more doses of medications per week No     Nutrition   Patient receives meals on wheels No   Patient follows low sodium diet Yes   Has foods at home that meet the current recommended diet Yes   Patient follows low sugar/card diet Yes     Urine   Difficulty urinating No   Changes in urine None     Time spent with patient   Time spent with patient  50 Minutes        Future Appointments Date Time Provider [place]  06/28/2017 3:15 PM [facility]  07/12/2017 9:30 AM Deboraha Sprang, MD [facility]  07/25/2017 11:00 AM Larey Dresser, MD [facility] None  10/18/2017 1:30 PM [name], MD [facility]    ATF pt CAO x4 in his bed room in the back of the house.  Pt stated that he has been fine besides the dry skin on his hands and lower legs. Pt admits to using alot of hand sanitizer throughout the day, I told pt to ask his pharmacist about a skin cream.  Pt has an MRI scheduled for his chronic shoulder pain.  He stated that the pain is tolerable for now.  He took his meds about 1 hr ago.  He denies sob, lightheadedness, and dizziness.  Pt seems to be in good spirits and his depression appears to be managed at this time.  rx bottles verified; he still weighs daily and record the readings; he still doesn't use the pill box.   BP 100/78 (BP Location: Left Arm, Patient Position: Sitting)   Pulse 61   Resp 16   Wt 262 lb (118.8 kg)   SpO2 98%   BMI 41.04 kg/m   Weight yesterday-262 Last visit weight- [place] Lena Fieldhouse, EMT Paramedic 06/05/2017    ACTION: Home visit completed Next visit planned for next wednesday

## 2017-06-10 NOTE — Progress Notes (Signed)
Loop Recorder Summary Report 

## 2017-06-12 DIAGNOSIS — G4733 Obstructive sleep apnea (adult) (pediatric): Secondary | ICD-10-CM | POA: Diagnosis not present

## 2017-06-14 ENCOUNTER — Other Ambulatory Visit (HOSPITAL_COMMUNITY): Payer: Self-pay

## 2017-06-14 NOTE — Progress Notes (Signed)
Paramedicine Encounter    Patient ID: [name], male    DOB: 1956/02/12, 61 y.o.   MRN: [id number]    Patient Care Team: [name], MD as PCP - General Himmelrich, Bryson Ha, RD (Inactive) as Dietitian  Patient Active Problem List   Diagnosis Date Noted  . Cold sore 05/18/2017  . Right lumbar radiculopathy 05/15/2017  . Thoracic back pain 05/09/2017  . Cellulitis of right leg 05/09/2017  . Right leg swelling 05/09/2017  . Right lower quadrant pain 05/09/2017  . Allergic conjunctivitis 04/18/2017  . Ingrown nail 04/18/2017  . Glaucoma 04/18/2017  . History of nasal polyposis 02/19/2017  . Chronic rhinitis 02/19/2017  . Benign localized prostatic hyperplasia with lower urinary tract symptoms (LUTS) 01/17/2017  . Acute on chronic systolic heart failure ([place])   . CAP (community acquired pneumonia) 12/31/2016  . Acute on chronic respiratory failure ([place]) 12/31/2016  . Lobar pneumonia ([place])   . Atrial fibrillation with RVR ([place])   . Microhematuria 12/04/2016  . Encounter for well adult exam with abnormal findings 11/29/2015  . Atrial fibrillation ([place]) 09/06/2015  . Chest pain 09/01/2015  . Rash 05/26/2015  . Cardiomyopathy ([place])   . Type II diabetes mellitus ([place])   . Hyperlipidemia   . Acute on chronic diastolic heart failure ([place]) 03/30/2015  . Persistent atrial fibrillation ([place]) 03/30/2015  . Syncope 03/30/2015  . Anemia 02/23/2015  . Thyrotoxicosis 02/23/2015  . Hx of adenomatous colonic polyps 10/21/2014  . Hemorrhoid 08/19/2014  . De Quervain's tenosynovitis, right 04/30/2014  . Allergic rhinitis 04/25/2014  . Osteoarthritis of right knee 02/19/2014  . Encounter for therapeutic drug monitoring 11/17/2013  . Thoracic degenerative disc disease 06/17/2013  . Thoracic spondylosis without myelopathy 02/11/2013  . Lumbosacral spondylosis without myelopathy 02/11/2013  . COPD (chronic obstructive pulmonary disease) ([place]) 08/20/2012  . Bundle branch block, right and  extreme left anterior fascicular 05/05/2012  . Nonischemic cardiomyopathy ([place]) 05/02/2012  . Anticoagulated on warfarin 03/21/2012  . Spongiotic dermatitis 09/26/2011  . Past myocardial infarction 04/19/2011  . UNSPECIFIED URETHRAL STRICTURE 12/26/2010  . Chronic pain syndrome 05/18/2010  . Obstructive sleep apnea 10/21/2007  . NEPHROLITHIASIS, HX OF 10/21/2007  . Anxiety 06/09/2007  . GERD 06/09/2007  . BENIGN PROSTATIC HYPERTROPHY 06/09/2007  . Morbid obesity ([place]) 06/03/2007  . Depression 06/03/2007  . Essential hypertension 06/03/2007  . INSOMNIA 06/03/2007    Current Outpatient Prescriptions:  .  acetaminophen (TYLENOL) 500 MG tablet, Take 500 mg by mouth every 6 (six) hours as needed., Disp: , Rfl:  .  albuterol (VENTOLIN HFA) 108 (90 Base) MCG/ACT inhaler, INHALE 2 PUFFS 4 TIMES A DAY as needed, Disp: 54 Inhaler, Rfl: 3 .  ALPRAZolam (XANAX) 0.5 MG tablet, TAKE 1 TO 2 TABLETS BY MOUTH AT BEDTIME AS NEEDED SLEEP, Disp: 180 tablet, Rfl: 1 .  aspirin-acetaminophen-caffeine (EXCEDRIN MIGRAINE) 250-250-65 MG tablet, , Disp: , Rfl:  .  carisoprodol (SOMA) 350 MG tablet, TAKE 1 TABLET BY MOUTH 3 TIMES A DAY AS NEEDED FOR MUSCLE SPASM, Disp: 90 tablet, Rfl: 5 .  carvedilol (COREG) 6.25 MG tablet, Take 1.5 tablets (9.375 mg total) by mouth 2 (two) times daily with a meal., Disp: 90 tablet, Rfl: 3 .  fluticasone (FLONASE) 50 MCG/ACT nasal spray, Place 2 sprays into both nostrils daily as needed for allergies or rhinitis., Disp: 18.2 g, Rfl: 5 .  Fluticasone-Salmeterol (ADVAIR DISKUS) 250-50 MCG/DOSE AEPB, Inhale 1 puff into the lungs 2 (two) times daily., Disp: 180 each, Rfl: 3 .  hydrocortisone 2.5 % cream, Apply 1 application topically at bedtime. itching , Disp: , Rfl:  .  ketoconazole (NIZORAL) 2 % cream, Apply 1 application topically daily., Disp: 15 g, Rfl: 0 .  lidocaine (LIDODERM) 5 %, APPLY 1 PATCH TO AFFECTED AREA FOR UP TO 12 HOURS . DO NOT USE MORE THAN ONE PATCH IN 24 HOURS,  Disp: 90 patch, Rfl: 1 .  linaclotide (LINZESS) 145 MCG CAPS capsule, Take 1 capsule (145 mcg total) by mouth every other day., Disp: 30 capsule, Rfl: 11 .  lisinopril (PRINIVIL,ZESTRIL) 2.5 MG tablet, Take 1 tablet (2.5 mg total) by mouth daily., Disp: 30 tablet, Rfl: 5 .  mometasone (ELOCON) 0.1 % ointment, Apply sparingly to affected areas daily as needed, Disp: 45 g, Rfl: 3 .  niacin 500 MG tablet, , Disp: , Rfl:  .  omeprazole (PRILOSEC) 20 MG capsule, Take 1 capsule (20 mg total) by mouth daily with supper., Disp: 90 capsule, Rfl: 1 .  potassium chloride SA (K-DUR,KLOR-CON) 20 MEQ tablet, Take 2 tabs in AM and 1 tab in PM, Disp: 270 tablet, Rfl: 2 .  sertraline (ZOLOFT) 100 MG tablet, TAKE 2 TABLETS BY MOUTH AT BEDTIME, Disp: 180 tablet, Rfl: 3 .  silver sulfADIAZINE (SILVADENE) 1 % cream, APPLY TO AFFECTED AREA TOPICALLY EVERY DAY, Disp: 50 g, Rfl: 3 .  sulfamethoxazole-trimethoprim (BACTRIM DS,SEPTRA DS) 800-160 MG tablet, Take 1 tablet by mouth 2 (two) times daily., Disp: 20 tablet, Rfl: 0 .  torsemide (DEMADEX) 20 MG tablet, Take 60 mg (3 Tablets) in the AM and 40 mg (2 Tablets) in the PM., Disp: 150 tablet, Rfl: 3 .  traMADol (ULTRAM) 50 MG tablet, Take 1 tablet (50 mg total) by mouth every 6 (six) hours as needed., Disp: 30 tablet, Rfl: 0 .  traZODone (DESYREL) 100 MG tablet, Take 0.5 tablets (50 mg total) by mouth at bedtime as needed., Disp: 45 tablet, Rfl: 1 .  triamcinolone cream (KENALOG) 0.1 %, Apply 1 application topically 3 (three) times daily. Apply to nail folds., Disp: 30 g, Rfl: 0 .  TUMS 500 MG chewable tablet, Chew 500-2,000 mg by mouth every 4 (four) hours as needed for indigestion or heartburn. , Disp: , Rfl:  .  valACYclovir (VALTREX) 1000 MG tablet, Take 1 tablet (1,000 mg total) by mouth 3 (three) times daily., Disp: 21 tablet, Rfl: 2 .  warfarin (COUMADIN) 5 MG tablet, Take as directed by anticoagulation clinic, Disp: 30 tablet, Rfl: 3 .  zolpidem (AMBIEN) 10 MG  tablet, TAKE 1 TABLET BY MOUTH AT BEDTIME, Disp: 30 tablet, Rfl: 5 Allergies  Allergen Reactions  . Amiodarone Other (See Comments)    hyperthyroidism  . Statins Other (See Comments)    myalgia  . Tape Other (See Comments)    Skin Tears Use Paper Tape Only     Social History   Social History  . Marital status: Divorced    Spouse name: N/A  . Number of children: 2  . Years of education: N/A   Occupational History  . retired/disabled. prev worked in Therapist, sports. Disabled   Social History Main Topics  . Smoking status: Former Smoker    Packs/day: 2.00    Years: 30.00    Types: Cigarettes    Quit date: 10/16/2007  . Smokeless tobacco: Never Used  . Alcohol use No  . Drug use: No  . Sexual activity: Not Currently   Other Topics Concern  . Not on file   Social History Narrative  Lives alone.    Physical Exam  Pulmonary/Chest: No respiratory distress.  Abdominal: He exhibits no distension. There is no tenderness. There is no rebound.  Musculoskeletal: He exhibits no edema.  Skin: Skin is warm and dry. He is not diaphoretic.        SAFE - 04/25/17 1100      Situation   Heart failure history Exisiting   Comorbidities Atrial fibillation;COPD;HTN   Readmitted within 30 days No   Hospital admission within past 12 months Yes   number of hospital admissions 1   number of ED visits 1   Target Weight 240 lbs     Assessment   Lives alone No   Primary support person Melodie Pogosyan   Mode of transportation personal car   Other services involved None   Home equipement Cane;Home O2;Scale;Walker     Weight   Weighs self daily Yes   Scale provided Yes   Records on weight chart Yes     Resources   Has "Living better w/heart failure" book Yes   Has HF Zone tool Yes   Able to identify yellow zone signs/when to call MD Yes   Records zone daily Yes     Medications   Uses a pill box No   Difficulty obtaining medications No   Mail order medications No   Missed  one or more doses of medications per week No     Nutrition   Patient receives meals on wheels No   Patient follows low sodium diet Yes   Has foods at home that meet the current recommended diet Yes   Patient follows low sugar/card diet Yes     Urine   Difficulty urinating No   Changes in urine None     Time spent with patient   Time spent with patient  50 Minutes        Future Appointments Date Time Provider [place]  06/25/2017 2:55 PM [facility]  06/28/2017 3:15 PM [facility]  07/12/2017 9:30 AM Deboraha Sprang, MD [facility]  07/25/2017 11:00 AM Larey Dresser, MD [facility] None  10/18/2017 1:40 PM [name], MD [facility]    ATF pt CAO X4 c/o itchiness. Pt stated that he believes that the itchiness is coming from taking so many medications and his is considering not taking them anymore.  I  Advised pt to wait to speak with Dr. [name] at his next appointment to discuss his meds before he stops on his own.  He agreed to wait but he did appear a little agitated.  Pt's vitals noted and is normal for him.  He has been taking his meds as prescribed and hasn't missed a dose.  He still doesn't feel the need to use a pill box and I agree.  rx verified.    BP 98/60 (BP Location: Left Arm, Patient Position: Sitting)   Pulse (!) 59   Resp 16   Wt 257 lb 9.6 oz (116.8 kg)   SpO2 96%   BMI 40.35 kg/m   Weight yesterday-259 Last visit weight-262    Kaedance Magos, EMT Paramedic 06/14/2017    ACTION: Home visit completed

## 2017-06-19 ENCOUNTER — Other Ambulatory Visit: Payer: Self-pay | Admitting: Internal Medicine

## 2017-06-19 NOTE — Telephone Encounter (Signed)
Done Done hardcopy to [employer]

## 2017-06-19 NOTE — Telephone Encounter (Signed)
Faxed

## 2017-06-20 ENCOUNTER — Other Ambulatory Visit (HOSPITAL_COMMUNITY): Payer: Self-pay

## 2017-06-20 NOTE — Progress Notes (Signed)
Paramedicine Encounter    Patient ID: [name], male    DOB: 01-25-1956, 61 y.o.   MRN: [id number]    Patient Care Team: [name], MD as PCP - General Himmelrich, Bryson Ha, RD (Inactive) as Dietitian  Patient Active Problem List   Diagnosis Date Noted  . Cold sore 05/18/2017  . Right lumbar radiculopathy 05/15/2017  . Thoracic back pain 05/09/2017  . Cellulitis of right leg 05/09/2017  . Right leg swelling 05/09/2017  . Right lower quadrant pain 05/09/2017  . Allergic conjunctivitis 04/18/2017  . Ingrown nail 04/18/2017  . Glaucoma 04/18/2017  . History of nasal polyposis 02/19/2017  . Chronic rhinitis 02/19/2017  . Benign localized prostatic hyperplasia with lower urinary tract symptoms (LUTS) 01/17/2017  . Acute on chronic systolic heart failure ([place])   . CAP (community acquired pneumonia) 12/31/2016  . Acute on chronic respiratory failure ([place]) 12/31/2016  . Lobar pneumonia ([place])   . Atrial fibrillation with RVR ([place])   . Microhematuria 12/04/2016  . Encounter for well adult exam with abnormal findings 11/29/2015  . Atrial fibrillation ([place]) 09/06/2015  . Chest pain 09/01/2015  . Rash 05/26/2015  . Cardiomyopathy ([place])   . Type II diabetes mellitus ([place])   . Hyperlipidemia   . Acute on chronic diastolic heart failure ([place]) 03/30/2015  . Persistent atrial fibrillation ([place]) 03/30/2015  . Syncope 03/30/2015  . Anemia 02/23/2015  . Thyrotoxicosis 02/23/2015  . Hx of adenomatous colonic polyps 10/21/2014  . Hemorrhoid 08/19/2014  . De Quervain's tenosynovitis, right 04/30/2014  . Allergic rhinitis 04/25/2014  . Osteoarthritis of right knee 02/19/2014  . Encounter for therapeutic drug monitoring 11/17/2013  . Thoracic degenerative disc disease 06/17/2013  . Thoracic spondylosis without myelopathy 02/11/2013  . Lumbosacral spondylosis without myelopathy 02/11/2013  . COPD (chronic obstructive pulmonary disease) ([place]) 08/20/2012  . Bundle branch block, right and  extreme left anterior fascicular 05/05/2012  . Nonischemic cardiomyopathy ([place]) 05/02/2012  . Anticoagulated on warfarin 03/21/2012  . Spongiotic dermatitis 09/26/2011  . Past myocardial infarction 04/19/2011  . UNSPECIFIED URETHRAL STRICTURE 12/26/2010  . Chronic pain syndrome 05/18/2010  . Obstructive sleep apnea 10/21/2007  . NEPHROLITHIASIS, HX OF 10/21/2007  . Anxiety 06/09/2007  . GERD 06/09/2007  . BENIGN PROSTATIC HYPERTROPHY 06/09/2007  . Morbid obesity ([place]) 06/03/2007  . Depression 06/03/2007  . Essential hypertension 06/03/2007  . INSOMNIA 06/03/2007    Current Outpatient Prescriptions:  .  acetaminophen (TYLENOL) 500 MG tablet, Take 500 mg by mouth every 6 (six) hours as needed., Disp: , Rfl:  .  albuterol (VENTOLIN HFA) 108 (90 Base) MCG/ACT inhaler, INHALE 2 PUFFS 4 TIMES A DAY as needed, Disp: 54 Inhaler, Rfl: 3 .  ALPRAZolam (XANAX) 0.5 MG tablet, TAKE 1 TO 2 TABLETS BY MOUTH AT BEDTIME AS NEEDED SLEEP, Disp: 180 tablet, Rfl: 1 .  aspirin-acetaminophen-caffeine (EXCEDRIN MIGRAINE) 250-250-65 MG tablet, , Disp: , Rfl:  .  carisoprodol (SOMA) 350 MG tablet, TAKE 1 TABLET BY MOUTH 3 TIMES A DAY AS NEEDED FOR MUSCLE SPASM, Disp: 90 tablet, Rfl: 5 .  carvedilol (COREG) 6.25 MG tablet, Take 1.5 tablets (9.375 mg total) by mouth 2 (two) times daily with a meal., Disp: 90 tablet, Rfl: 3 .  fluticasone (FLONASE) 50 MCG/ACT nasal spray, Place 2 sprays into both nostrils daily as needed for allergies or rhinitis., Disp: 18.2 g, Rfl: 5 .  Fluticasone-Salmeterol (ADVAIR DISKUS) 250-50 MCG/DOSE AEPB, Inhale 1 puff into the lungs 2 (two) times daily., Disp: 180 each, Rfl: 3 .  hydrocortisone 2.5 % cream, Apply 1 application topically at bedtime. itching , Disp: , Rfl:  .  ketoconazole (NIZORAL) 2 % cream, Apply 1 application topically daily., Disp: 15 g, Rfl: 0 .  lidocaine (LIDODERM) 5 %, APPLY 1 PATCH TO AFFECTED AREA FOR UP TO 12 HOURS . DO NOT USE MORE THAN ONE PATCH IN 24 HOURS,  Disp: 90 patch, Rfl: 1 .  linaclotide (LINZESS) 145 MCG CAPS capsule, Take 1 capsule (145 mcg total) by mouth every other day., Disp: 30 capsule, Rfl: 11 .  lisinopril (PRINIVIL,ZESTRIL) 2.5 MG tablet, Take 1 tablet (2.5 mg total) by mouth daily., Disp: 30 tablet, Rfl: 5 .  mometasone (ELOCON) 0.1 % ointment, Apply sparingly to affected areas daily as needed, Disp: 45 g, Rfl: 3 .  niacin 500 MG tablet, , Disp: , Rfl:  .  omeprazole (PRILOSEC) 20 MG capsule, Take 1 capsule (20 mg total) by mouth daily with supper., Disp: 90 capsule, Rfl: 1 .  potassium chloride SA (K-DUR,KLOR-CON) 20 MEQ tablet, Take 2 tabs in AM and 1 tab in PM, Disp: 270 tablet, Rfl: 2 .  sertraline (ZOLOFT) 100 MG tablet, TAKE 2 TABLETS BY MOUTH AT BEDTIME, Disp: 180 tablet, Rfl: 3 .  silver sulfADIAZINE (SILVADENE) 1 % cream, APPLY TO AFFECTED AREA TOPICALLY EVERY DAY, Disp: 50 g, Rfl: 3 .  sulfamethoxazole-trimethoprim (BACTRIM DS,SEPTRA DS) 800-160 MG tablet, Take 1 tablet by mouth 2 (two) times daily., Disp: 20 tablet, Rfl: 0 .  torsemide (DEMADEX) 20 MG tablet, Take 60 mg (3 Tablets) in the AM and 40 mg (2 Tablets) in the PM., Disp: 150 tablet, Rfl: 3 .  traMADol (ULTRAM) 50 MG tablet, TAKE 1 TABLET BY MOUTH EVERY 6 HOURS AS NEEDED, Disp: 30 tablet, Rfl: 2 .  traZODone (DESYREL) 100 MG tablet, Take 0.5 tablets (50 mg total) by mouth at bedtime as needed., Disp: 45 tablet, Rfl: 1 .  triamcinolone cream (KENALOG) 0.1 %, Apply 1 application topically 3 (three) times daily. Apply to nail folds., Disp: 30 g, Rfl: 0 .  TUMS 500 MG chewable tablet, Chew 500-2,000 mg by mouth every 4 (four) hours as needed for indigestion or heartburn. , Disp: , Rfl:  .  valACYclovir (VALTREX) 1000 MG tablet, Take 1 tablet (1,000 mg total) by mouth 3 (three) times daily., Disp: 21 tablet, Rfl: 2 .  warfarin (COUMADIN) 5 MG tablet, Take as directed by anticoagulation clinic, Disp: 30 tablet, Rfl: 3 .  zolpidem (AMBIEN) 10 MG tablet, TAKE 1 TABLET BY  MOUTH AT BEDTIME, Disp: 30 tablet, Rfl: 5 Allergies  Allergen Reactions  . Amiodarone Other (See Comments)    hyperthyroidism  . Statins Other (See Comments)    myalgia  . Tape Other (See Comments)    Skin Tears Use Paper Tape Only     Social History   Social History  . Marital status: Divorced    Spouse name: N/A  . Number of children: 2  . Years of education: N/A   Occupational History  . retired/disabled. prev worked in Therapist, sports. Disabled   Social History Main Topics  . Smoking status: Former Smoker    Packs/day: 2.00    Years: 30.00    Types: Cigarettes    Quit date: 10/16/2007  . Smokeless tobacco: Never Used  . Alcohol use No  . Drug use: No  . Sexual activity: Not Currently   Other Topics Concern  . Not on file   Social History Narrative   Lives alone.  Physical Exam  Pulmonary/Chest: No respiratory distress. He has no wheezes. He has no rales.  Abdominal: He exhibits no distension. There is no rebound and no guarding.  Musculoskeletal: He exhibits no edema.  Skin: Skin is warm and dry. He is not diaphoretic.        SAFE - 04/25/17 1100      Situation   Heart failure history Exisiting   Comorbidities Atrial fibillation;COPD;HTN   Readmitted within 30 days No   Hospital admission within past 12 months Yes   number of hospital admissions 1   number of ED visits 1   [facility] Weight 240 lbs     Assessment   Lives alone No   Primary support person Melodie Pandey   Mode of transportation personal car   Other services involved None   Home equipement Cane;Home O2;Scale;Walker     Weight   Weighs self daily Yes   Scale provided Yes   Records on weight chart Yes     Resources   Has "Living better w/heart failure" book Yes   Has HF Zone tool Yes   Able to identify yellow zone signs/when to call MD Yes   Records zone daily Yes     Medications   Uses a pill box No   Difficulty obtaining medications No   Mail order medications No    Missed one or more doses of medications per week No     Nutrition   Patient receives meals on wheels No   Patient follows low sodium diet Yes   Has foods at home that meet the current recommended diet Yes   Patient follows low sugar/card diet Yes     Urine   Difficulty urinating No   Changes in urine None     Time spent with patient   Time spent with patient  50 Minutes        Future Appointments Date Time Provider [place]  06/25/2017 2:55 PM [facility]  06/28/2017 3:15 PM [facility]  07/04/2017 10:30 AM Lyndal Pulley, DO [facility]  07/12/2017 9:30 AM Deboraha Sprang, MD [facility]  07/25/2017 11:00 AM Larey Dresser, MD [facility] None  10/18/2017 1:40 PM [name], MD [facility]    ATF pt CAO x4 havent missed any meds this week.  Ate rice last night and added hot sause/soy sause/laureys.  He denies sob dizziness. Still have itchyness.  He want to talk to dr. Jens Som about getting a defib. Pt denies sob, dizziness and chest pain.  He denies missing any medications doses within this past week.  Pt still think that the itchiness is due to his "heart medications". rx bottles verified.  Wt 254 lb (115.2 kg)   BMI 39.78 kg/m   Weight yesterday-252 Last visit weight-257   Jaeleigh Monaco, EMT Paramedic 06/20/2017    ACTION: Home visit completed

## 2017-06-24 DIAGNOSIS — J449 Chronic obstructive pulmonary disease, unspecified: Secondary | ICD-10-CM | POA: Diagnosis not present

## 2017-06-25 ENCOUNTER — Ambulatory Visit (INDEPENDENT_AMBULATORY_CARE_PROVIDER_SITE_OTHER): Payer: Medicare HMO | Admitting: *Deleted

## 2017-06-25 DIAGNOSIS — R55 Syncope and collapse: Secondary | ICD-10-CM | POA: Diagnosis not present

## 2017-06-25 NOTE — Progress Notes (Signed)
Carelink Summary Report / Loop Recorder 

## 2017-06-26 ENCOUNTER — Other Ambulatory Visit (HOSPITAL_COMMUNITY): Payer: Self-pay

## 2017-06-26 NOTE — Progress Notes (Signed)
Paramedicine Encounter    Patient ID: [name], male    DOB: [birth year], 61 y.o.   MRN: [id number]    Patient Care Team: [name], MD as PCP - General Himmelrich, Bryson Ha, RD (Inactive) as Dietitian  Patient Active Problem List   Diagnosis Date Noted  . Cold sore 05/18/2017  . Right lumbar radiculopathy 05/15/2017  . Thoracic back pain 05/09/2017  . Cellulitis of right leg 05/09/2017  . Right leg swelling 05/09/2017  . Right lower quadrant pain 05/09/2017  . Allergic conjunctivitis 04/18/2017  . Ingrown nail 04/18/2017  . Glaucoma 04/18/2017  . History of nasal polyposis 02/19/2017  . Chronic rhinitis 02/19/2017  . Benign localized prostatic hyperplasia with lower urinary tract symptoms (LUTS) 01/17/2017  . Acute on chronic systolic heart failure ([place])   . CAP (community acquired pneumonia) 12/31/2016  . Acute on chronic respiratory failure ([place]) 12/31/2016  . Lobar pneumonia ([place])   . Atrial fibrillation with RVR ([place])   . Microhematuria 12/04/2016  . Encounter for well adult exam with abnormal findings 11/29/2015  . Atrial fibrillation ([place]) 09/06/2015  . Chest pain 09/01/2015  . Rash 05/26/2015  . Cardiomyopathy ([place])   . Type II diabetes mellitus ([place])   . Hyperlipidemia   . Acute on chronic diastolic heart failure ([place]) 03/30/2015  . Persistent atrial fibrillation ([place]) 03/30/2015  . Syncope 03/30/2015  . Anemia 02/23/2015  . Thyrotoxicosis 02/23/2015  . Hx of adenomatous colonic polyps 10/21/2014  . Hemorrhoid 08/19/2014  . De Quervain's tenosynovitis, right 04/30/2014  . Allergic rhinitis 04/25/2014  . Osteoarthritis of right knee 02/19/2014  . Encounter for therapeutic drug monitoring 11/17/2013  . Thoracic degenerative disc disease 06/17/2013  . Thoracic spondylosis without myelopathy 02/11/2013  . Lumbosacral spondylosis without myelopathy 02/11/2013  . COPD (chronic obstructive pulmonary disease) ([place]) 08/20/2012  . Bundle branch block, right and  extreme left anterior fascicular 05/05/2012  . Nonischemic cardiomyopathy ([place]) 05/02/2012  . Anticoagulated on warfarin 03/21/2012  . Spongiotic dermatitis 09/26/2011  . Past myocardial infarction 04/19/2011  . UNSPECIFIED URETHRAL STRICTURE 12/26/2010  . Chronic pain syndrome 05/18/2010  . Obstructive sleep apnea 10/21/2007  . NEPHROLITHIASIS, HX OF 10/21/2007  . Anxiety 06/09/2007  . GERD 06/09/2007  . BENIGN PROSTATIC HYPERTROPHY 06/09/2007  . Morbid obesity ([place]) 06/03/2007  . Depression 06/03/2007  . Essential hypertension 06/03/2007  . INSOMNIA 06/03/2007    Current Outpatient Prescriptions:  .  acetaminophen (TYLENOL) 500 MG tablet, Take 500 mg by mouth every 6 (six) hours as needed., Disp: , Rfl:  .  albuterol (VENTOLIN HFA) 108 (90 Base) MCG/ACT inhaler, INHALE 2 PUFFS 4 TIMES A DAY as needed, Disp: 54 Inhaler, Rfl: 3 .  ALPRAZolam (XANAX) 0.5 MG tablet, TAKE 1 TO 2 TABLETS BY MOUTH AT BEDTIME AS NEEDED SLEEP, Disp: 180 tablet, Rfl: 1 .  aspirin-acetaminophen-caffeine (EXCEDRIN MIGRAINE) 250-250-65 MG tablet, , Disp: , Rfl:  .  carisoprodol (SOMA) 350 MG tablet, TAKE 1 TABLET BY MOUTH 3 TIMES A DAY AS NEEDED FOR MUSCLE SPASM, Disp: 90 tablet, Rfl: 5 .  carvedilol (COREG) 6.25 MG tablet, Take 1.5 tablets (9.375 mg total) by mouth 2 (two) times daily with a meal., Disp: 90 tablet, Rfl: 3 .  fluticasone (FLONASE) 50 MCG/ACT nasal spray, Place 2 sprays into both nostrils daily as needed for allergies or rhinitis., Disp: 18.2 g, Rfl: 5 .  Fluticasone-Salmeterol (ADVAIR DISKUS) 250-50 MCG/DOSE AEPB, Inhale 1 puff into the lungs 2 (two) times daily., Disp: 180 each, Rfl: 3 .  hydrocortisone 2.5 % cream, Apply 1 application topically at bedtime. itching , Disp: , Rfl:  .  ketoconazole (NIZORAL) 2 % cream, Apply 1 application topically daily., Disp: 15 g, Rfl: 0 .  lidocaine (LIDODERM) 5 %, APPLY 1 PATCH TO AFFECTED AREA FOR UP TO 12 HOURS . DO NOT USE MORE THAN ONE PATCH IN 24 HOURS,  Disp: 90 patch, Rfl: 1 .  linaclotide (LINZESS) 145 MCG CAPS capsule, Take 1 capsule (145 mcg total) by mouth every other day., Disp: 30 capsule, Rfl: 11 .  lisinopril (PRINIVIL,ZESTRIL) 2.5 MG tablet, Take 1 tablet (2.5 mg total) by mouth daily., Disp: 30 tablet, Rfl: 5 .  mometasone (ELOCON) 0.1 % ointment, Apply sparingly to affected areas daily as needed, Disp: 45 g, Rfl: 3 .  niacin 500 MG tablet, , Disp: , Rfl:  .  omeprazole (PRILOSEC) 20 MG capsule, Take 1 capsule (20 mg total) by mouth daily with supper., Disp: 90 capsule, Rfl: 1 .  potassium chloride SA (K-DUR,KLOR-CON) 20 MEQ tablet, Take 2 tabs in AM and 1 tab in PM, Disp: 270 tablet, Rfl: 2 .  sertraline (ZOLOFT) 100 MG tablet, TAKE 2 TABLETS BY MOUTH AT BEDTIME, Disp: 180 tablet, Rfl: 3 .  silver sulfADIAZINE (SILVADENE) 1 % cream, APPLY TO AFFECTED AREA TOPICALLY EVERY DAY, Disp: 50 g, Rfl: 3 .  sulfamethoxazole-trimethoprim (BACTRIM DS,SEPTRA DS) 800-160 MG tablet, Take 1 tablet by mouth 2 (two) times daily., Disp: 20 tablet, Rfl: 0 .  torsemide (DEMADEX) 20 MG tablet, Take 60 mg (3 Tablets) in the AM and 40 mg (2 Tablets) in the PM., Disp: 150 tablet, Rfl: 3 .  traMADol (ULTRAM) 50 MG tablet, TAKE 1 TABLET BY MOUTH EVERY 6 HOURS AS NEEDED, Disp: 30 tablet, Rfl: 2 .  traZODone (DESYREL) 100 MG tablet, Take 0.5 tablets (50 mg total) by mouth at bedtime as needed., Disp: 45 tablet, Rfl: 1 .  triamcinolone cream (KENALOG) 0.1 %, Apply 1 application topically 3 (three) times daily. Apply to nail folds., Disp: 30 g, Rfl: 0 .  TUMS 500 MG chewable tablet, Chew 500-2,000 mg by mouth every 4 (four) hours as needed for indigestion or heartburn. , Disp: , Rfl:  .  valACYclovir (VALTREX) 1000 MG tablet, Take 1 tablet (1,000 mg total) by mouth 3 (three) times daily., Disp: 21 tablet, Rfl: 2 .  warfarin (COUMADIN) 5 MG tablet, Take as directed by anticoagulation clinic, Disp: 30 tablet, Rfl: 3 .  zolpidem (AMBIEN) 10 MG tablet, TAKE 1 TABLET BY  MOUTH AT BEDTIME, Disp: 30 tablet, Rfl: 5 Allergies  Allergen Reactions  . Amiodarone Other (See Comments)    hyperthyroidism  . Statins Other (See Comments)    myalgia  . Tape Other (See Comments)    Skin Tears Use Paper Tape Only     Social History   Social History  . Marital status: Divorced    Spouse name: N/A  . Number of children: 2  . Years of education: N/A   Occupational History  . retired/disabled. prev worked in Therapist, sports. Disabled   Social History Main Topics  . Smoking status: Former Smoker    Packs/day: 2.00    Years: 30.00    Types: Cigarettes    Quit date: 10/16/2007  . Smokeless tobacco: Never Used  . Alcohol use No  . Drug use: No  . Sexual activity: Not Currently   Other Topics Concern  . Not on file   Social History Narrative   Lives alone.  Physical Exam  Pulmonary/Chest: No respiratory distress. He has no wheezes. He has no rales.  Abdominal: He exhibits no distension. There is no guarding.  Musculoskeletal: He exhibits no edema.  Skin: Skin is warm and dry. He is not diaphoretic.        Future Appointments Date Time Provider [place]  06/28/2017 3:15 PM [facility]  07/04/2017 10:30 AM Lyndal Pulley, DO [facility]  07/12/2017 9:30 AM Deboraha Sprang, MD [facility]  07/25/2017 11:00 AM Larey Dresser, MD [facility] None  07/25/2017 3:00 PM [facility] DEVICE REMOTES [facility]  10/18/2017 1:40 PM [name], MD [facility]    ATF pt CAO x4 washing dishes.  Pt stated that he took all of his meds today beside the linzess.  He stated that he will take it tomorrow.  Pt has no complaints today.  He denies sob, dizziness, and lightheadednss.  He stated that he still have issues with his skin.  Pt has an appointment next week with the pain clinic for his constant shoulder pain. rx bottles verified.   BP 110/72   Pulse 67   Resp 12   Wt 255 lb  (115.7 kg)   SpO2 96%   BMI 39.94 kg/m   Weight yesterday-253 Last visit weight-254    Gizzelle Lacomb, EMT Paramedic 06/26/2017    ACTION: Home visit completed

## 2017-06-27 LAB — CUP PACEART REMOTE DEVICE CHECK
Implantable Pulse Generator Implant Date: 20160622
MDC IDC SESS DTM: 20180911103953

## 2017-06-28 ENCOUNTER — Ambulatory Visit: Payer: Medicare HMO

## 2017-06-28 ENCOUNTER — Encounter: Payer: Self-pay | Admitting: Internal Medicine

## 2017-07-03 NOTE — Progress Notes (Signed)
[name] [address], [place] [address] Phone: [phone] Subjective:    I'm seeing this patient by the request  of:    CC: Mid back pain  YWV:[id number]  [name] is a 61 y.o. male coming in with complaint of mid back pain on the right side. He has been having back pain for a while and has been in and out of pain clinics. Cortizone shots did seem to help him. He is looking to get some trigger point injections today. Patient describes the pain is unrelenting. Severe. Since then is 9 out of 10 in severity. Sometimes can wake him up at night as well. Affect daily activities. States that he wants to stay away from pain medications even though he has been to many different pain clinics in the past.    patient did have an MRI of the thoracic spine. 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  Past Medical History:  Diagnosis Date  . Anemia    supposed to be taking Vit B but doesn't  . ANXIETY    takes Xanax nightly  . Arthritis   . ASTHMA    Albuterol prn and Advair daily;also takes Prednisone daily  . Asthma   . Atrial fibrillation ([place]) 09/06/2015  . Cardiomyopathy [facility])    a. EF 25% TEE July 2013; b. EF normalized 2015;  c. 03/2015 Echo: EF 40-45%, difrf HK, PASP 38 mmHg, Mild MR, sev LAE/RAE.  [name] Chronic constipation    takes OTC stool softener  . COPD (chronic obstructive pulmonary disease) ([place])   . DEPRESSION    takes Zoloft and Doxepin daily  . Diverticulitis   . DYSKINESIA, ESOPHAGUS   . Essential hypertension       . FIBROMYALGIA   . GERD (gastroesophageal reflux disease)       . Glaucoma   . HYPERLIPIDEMIA    a. Intolerant to statins.  . INSOMNIA    takes Ambien nightly  . Myocardial infarction [facility])    a. 2012 Myoview notable for prior infarct;  b. 03/2015 Lexiscan CL: EF 37%, diff HK, small area of inferior infarct from apex to base-->Med Rx.  . Myocardial infarction ([place])   . O2 dependent     uses oxygen 2 and 1/2 liters per  at hs  . Paroxysmal atrial fibrillation ([facility])    a. CHA2DS2VASc = 3--> takes Coumadin;  b. 03/15/2015 Successful TEE/DCCV;  c. 03/2015 recurrent afib, Amio d/c'd in setting of hyperthyroidism.  . Peripheral neuropathy   . Pneumonia 12/2016  . Rash and other nonspecific skin eruption 04/12/2009   no cause found saw dermatologists x 2 and allergist  . SLEEP APNEA, OBSTRUCTIVE    a. doesn't use CPAP  . Syncope    a. 03/2015 s/p MDT LINQ.  [name] Type II diabetes mellitus ([place])        Past Surgical History:  Procedure Laterality Date  . ACNE CYST REMOVAL     2 on back   . ADENOIDECTOMY    . CARDIAC CATHETERIZATION N/A 03/21/2016   Procedure: Right/Left Heart Cath and Coronary Angiography;  Surgeon: Larey Dresser, MD;  Location: [place] CV [facility];  Service: Cardiovascular;  Laterality: N/A;  . CARDIOVERSION  04/18/2012   Procedure: CARDIOVERSION;  Surgeon: Fay Records, MD;  Location: [place];  Service: Cardiovascular;  Laterality: N/A;  . CARDIOVERSION  04/25/2012   Procedure: CARDIOVERSION;  Surgeon: Wonda Cheng Nahser,  MD;  Location: [place];  Service: Cardiovascular;  Laterality: N/A;  . CARDIOVERSION  04/25/2012   Procedure: CARDIOVERSION;  Surgeon: Fay Records, MD;  Location: [place];  Service: Cardiovascular;  Laterality: N/A;  . CARDIOVERSION  05/09/2012   Procedure: CARDIOVERSION;  Surgeon: Sherren Mocha, MD;  Location: [place];  Service: Cardiovascular;  Laterality: N/A;  changed from crenshaw to cooper by trish/leone-endo  . CARDIOVERSION N/A 03/15/2015   Procedure: CARDIOVERSION;  Surgeon: Thayer Headings, MD;  Location[facility] ENDOSCOPY;  Service: Cardiovascular;  Laterality: N/A;  . COLONOSCOPY    . COLONOSCOPY WITH PROPOFOL N/A 10/21/2014   Procedure: COLONOSCOPY WITH PROPOFOL;  Surgeon: Ladene Artist, MD;  Location: [facility] ENDOSCOPY;  Service: Endoscopy;  Laterality: N/A;  . EP IMPLANTABLE DEVICE N/A 04/06/2015   Procedure: Loop Recorder Insertion;   Surgeon: Evans Lance, MD;  Location: [place] CV LAB;  Service: Cardiovascular;  Laterality: N/A;  . ESOPHAGOGASTRODUODENOSCOPY    . RIGHT/LEFT HEART CATH AND CORONARY ANGIOGRAPHY N/A 01/28/2017   Procedure: Right/Left Heart Cath and Coronary Angiography;  Surgeon: Larey Dresser, MD;  Location: [place] [facility];  Service: Cardiovascular;  Laterality: N/A;  . TEE WITHOUT CARDIOVERSION  04/25/2012   Procedure: TRANSESOPHAGEAL ECHOCARDIOGRAM (TEE);  Surgeon: Thayer Headings, MD;  Location: [place];  Service: Cardiovascular;  Laterality: N/A;  . TEE WITHOUT CARDIOVERSION N/A 03/15/2015   Procedure: TRANSESOPHAGEAL ECHOCARDIOGRAM (TEE);  Surgeon: Thayer Headings, MD;  Location: [place];  Service: Cardiovascular;  Laterality: N/A;  . TONSILLECTOMY    . TOTAL KNEE ARTHROPLASTY Right 06/15/2014   Procedure: TOTAL KNEE ARTHROPLASTY;  Surgeon: Renette Butters, MD;  Location: [place];  Service: Orthopedics;  Laterality: Right;   Social History   Social History  . Marital status: Divorced    Spouse name: N/A  . Number of children: 2  . Years of education: N/A   Occupational History  . retired/disabled. prev worked in Therapist, sports. Disabled   Social History Main Topics  . Smoking status: Former Smoker    Packs/day: 2.00    Years: 30.00    Types: Cigarettes    Quit date: 10/16/2007  . Smokeless tobacco: Never Used  . Alcohol use No  . Drug use: No  . Sexual activity: Not Currently   Other Topics Concern  . None   Social History Narrative   Lives alone.   Allergies  Allergen Reactions  . Amiodarone Other (See Comments)    hyperthyroidism  . Statins Other (See Comments)    myalgia  . Tape Other (See Comments)    Skin Tears Use Paper Tape Only   Family History  Problem Relation Age of Onset  . COPD Mother   . Asthma Mother   . Colon polyps Mother   . Allergies Mother   . Hypothyroidism Mother   . Asthma Maternal Grandmother   . Colon cancer Neg Hx       Past medical history, social, surgical and family history all reviewed in electronic medical record.  No pertanent information unless stated regarding to the chief complaint.   Review of Systems:Review of systems updated and as accurate as of 07/04/17  No headache, visual changes, nausea, vomiting, diarrhea, constipation, dizziness, abdominal pain, skin rash, fevers, chills, night sweats, weight loss, swollen lymph nodes, body aches, joint swelling, , chest pain, shortness of breath, mood changes. Positive muscle aches  Objective  Blood pressure 100/62, pulse 75, height 5\' 10"  (1.778 m), weight 264 lb (119.7 kg), SpO2  98 %. Systems examined below as of 07/04/17   General: No apparent distress alert and oriented x3 mood and affect normal, dressed appropriately.  HEENT: Pupils equal, extraocular movements intact  Respiratory: Patient's speak in full sentences and does not appear short of breath  Cardiovascular: No lower extremity edema, non tender, no erythema  Skin: Warm dry intact with no signs of infection or rash on extremities or on axial skeleton.  Abdomen: Soft nontender  Neuro: Cranial nerves II through XII are intact, neurovascularly intact in all extremities with 2+ DTRs and 2+ pulses.  Lymph: No lymphadenopathy of posterior or anterior cervical chain or axillae bilaterally.  Gait normal with good balance and coordination. Walks with a cane MSK:  Non tender with full range of motion and good stability and symmetric strength and tone of shoulders, elbows, wrist, hip, knee and ankles bilaterally. Arthritic changes of multiple joints Patient's back exam shows the patient does have multiple small bruises on his back in other areas of the skin. Patient does have severe kyphosis of the back especially at T10-T11. Mild to moderate diffuse tenderness to palpation in the paraspinal musculature. Multiple trigger points noted in the paraspinal musculature on the left in TL junction.    After verbal consent patient was prepped with call swabs and with a 25-gauge half-inch needle was injected with 4 specific trigger points in the paraspinal musculature. Total of 4 mL of 0.5% Marcaine and 1 mL of Kenalog 40 mg/dL use. Post injection instructions given. No blood loss   Impression and Recommendations:     This case required medical decision making of moderate complexity.      Note: This dictation was prepared with Dragon dictation along with smaller phrase technology. Any transcriptional errors that result from this process are unintentional.

## 2017-07-04 ENCOUNTER — Ambulatory Visit (INDEPENDENT_AMBULATORY_CARE_PROVIDER_SITE_OTHER): Payer: Medicare HMO | Admitting: Family Medicine

## 2017-07-04 ENCOUNTER — Ambulatory Visit (INDEPENDENT_AMBULATORY_CARE_PROVIDER_SITE_OTHER): Payer: Medicare HMO | Admitting: General Practice

## 2017-07-04 ENCOUNTER — Encounter: Payer: Self-pay | Admitting: Family Medicine

## 2017-07-04 DIAGNOSIS — Z7901 Long term (current) use of anticoagulants: Secondary | ICD-10-CM | POA: Insufficient documentation

## 2017-07-04 DIAGNOSIS — M5134 Other intervertebral disc degeneration, thoracic region: Secondary | ICD-10-CM

## 2017-07-04 DIAGNOSIS — M546 Pain in thoracic spine: Secondary | ICD-10-CM | POA: Diagnosis not present

## 2017-07-04 DIAGNOSIS — Z5181 Encounter for therapeutic drug level monitoring: Secondary | ICD-10-CM

## 2017-07-04 LAB — POCT INR: INR: 6.5

## 2017-07-04 MED ORDER — VENLAFAXINE HCL ER 37.5 MG PO CP24: 37.5000 mg | ORAL_CAPSULE | Freq: Every day | ORAL | 1 refills | Status: DC

## 2017-07-04 MED ORDER — VITAMIN D (ERGOCALCIFEROL) 1.25 MG (50000 UNIT) PO CAPS: 50000.0000 [IU] | ORAL_CAPSULE | ORAL | 0 refills | Status: DC

## 2017-07-04 NOTE — Patient Instructions (Addendum)
Good to see you.  Ice 20 minutes 2 times daily. Usually after activity and before bed. Exercises 3 times a week.  pennsaid pinkie amount topically 2 times daily as needed.   We did some trigger point injections today  Once weekly vitamin D for 12 weeks.  effexor 37.5 mg daily for pain.  Over the counter get Turmeric 500mg  daily  Tart cherry extract any dose at night See me again in 4-6 weeks and we can discuss epidurals if needed If doing well see me when you need me.

## 2017-07-04 NOTE — Assessment & Plan Note (Signed)
Patient did have some trigger point injections done today. Hopefully he'll be beneficial. We discussed over-the-counter medications and started on Effexor. We will keep a low dose and her to other patient's comorbidities but see if this will help with some of the chronic pain the patient has. We discussed in great detail that there will be no pain medications given for this. Patient could be though a candidate for epidurals like he has responded before in the past.

## 2017-07-04 NOTE — Patient Instructions (Signed)
Pre visit review using our clinic review tool, if applicable. No additional management support is needed unless otherwise documented below in the visit note. 

## 2017-07-04 NOTE — Assessment & Plan Note (Signed)
Given injections 07/04/2017. We discussed the possibility of epidural but with patient being on Coumadin patient went hunting bent change. Patient has significant easy bruising at the moment as well. I asked icing regimen, home exercises, which activities to do a which ones to avoid. Patient will follow-up with me again in 3-4 weeks.

## 2017-07-09 ENCOUNTER — Ambulatory Visit (INDEPENDENT_AMBULATORY_CARE_PROVIDER_SITE_OTHER): Payer: Medicare HMO | Admitting: General Practice

## 2017-07-09 DIAGNOSIS — Z23 Encounter for immunization: Secondary | ICD-10-CM

## 2017-07-09 DIAGNOSIS — Z7901 Long term (current) use of anticoagulants: Secondary | ICD-10-CM | POA: Diagnosis not present

## 2017-07-09 LAB — POCT INR: INR: 1.1

## 2017-07-09 NOTE — Progress Notes (Signed)
I have reviewed and agree with the plan. 

## 2017-07-09 NOTE — Patient Instructions (Signed)
Pre visit review using our clinic review tool, if applicable. No additional management support is needed unless otherwise documented below in the visit note. 

## 2017-07-11 ENCOUNTER — Other Ambulatory Visit (HOSPITAL_COMMUNITY): Payer: Self-pay

## 2017-07-11 NOTE — Progress Notes (Signed)
Paramedicine Encounter    Patient ID: [name], male    DOB: 04-Jun-1956, 61 y.o.   MRN: [id number]    Patient Care Team: [name], MD as PCP - General Himmelrich, Bryson Ha, RD (Inactive) as Dietitian  Patient Active Problem List   Diagnosis Date Noted  . Long term (current) use of anticoagulants 07/04/2017  . Trigger point of thoracic region 07/04/2017  . Cold sore 05/18/2017  . Right lumbar radiculopathy 05/15/2017  . Thoracic back pain 05/09/2017  . Cellulitis of right leg 05/09/2017  . Right leg swelling 05/09/2017  . Right lower quadrant pain 05/09/2017  . Allergic conjunctivitis 04/18/2017  . Ingrown nail 04/18/2017  . Glaucoma 04/18/2017  . History of nasal polyposis 02/19/2017  . Chronic rhinitis 02/19/2017  . Benign localized prostatic hyperplasia with lower urinary tract symptoms (LUTS) 01/17/2017  . Acute on chronic systolic heart failure ([place])   . CAP (community acquired pneumonia) 12/31/2016  . Acute on chronic respiratory failure ([place]) 12/31/2016  . Lobar pneumonia ([place])   . Atrial fibrillation with RVR ([place])   . Microhematuria 12/04/2016  . Encounter for well adult exam with abnormal findings 11/29/2015  . Atrial fibrillation ([place]) 09/06/2015  . Chest pain 09/01/2015  . Rash 05/26/2015  . Cardiomyopathy ([place])   . Type II diabetes mellitus ([place])   . Hyperlipidemia   . Acute on chronic diastolic heart failure ([place]) 03/30/2015  . Persistent atrial fibrillation ([place]) 03/30/2015  . Syncope 03/30/2015  . Anemia 02/23/2015  . Thyrotoxicosis 02/23/2015  . Hx of adenomatous colonic polyps 10/21/2014  . Hemorrhoid 08/19/2014  . De Quervain's tenosynovitis, right 04/30/2014  . Allergic rhinitis 04/25/2014  . Osteoarthritis of right knee 02/19/2014  . Encounter for therapeutic drug monitoring 11/17/2013  . Thoracic degenerative disc disease 06/17/2013  . Thoracic spondylosis without myelopathy 02/11/2013  . Lumbosacral spondylosis without myelopathy  02/11/2013  . COPD (chronic obstructive pulmonary disease) ([place]) 08/20/2012  . Bundle branch block, right and extreme left anterior fascicular 05/05/2012  . Nonischemic cardiomyopathy ([place]) 05/02/2012  . Anticoagulated on warfarin 03/21/2012  . Spongiotic dermatitis 09/26/2011  . Past myocardial infarction 04/19/2011  . UNSPECIFIED URETHRAL STRICTURE 12/26/2010  . Chronic pain syndrome 05/18/2010  . Obstructive sleep apnea 10/21/2007  . NEPHROLITHIASIS, HX OF 10/21/2007  . Anxiety 06/09/2007  . GERD 06/09/2007  . BENIGN PROSTATIC HYPERTROPHY 06/09/2007  . Morbid obesity ([place]) 06/03/2007  . Depression 06/03/2007  . Essential hypertension 06/03/2007  . INSOMNIA 06/03/2007    Current Outpatient Prescriptions:  .  ALPRAZolam (XANAX) 0.5 MG tablet, TAKE 1 TO 2 TABLETS BY MOUTH AT BEDTIME AS NEEDED SLEEP, Disp: 180 tablet, Rfl: 1 .  carisoprodol (SOMA) 350 MG tablet, TAKE 1 TABLET BY MOUTH 3 TIMES A DAY AS NEEDED FOR MUSCLE SPASM, Disp: 90 tablet, Rfl: 5 .  carvedilol (COREG) 6.25 MG tablet, Take 1.5 tablets (9.375 mg total) by mouth 2 (two) times daily with a meal., Disp: 90 tablet, Rfl: 3 .  linaclotide (LINZESS) 145 MCG CAPS capsule, Take 1 capsule (145 mcg total) by mouth every other day., Disp: 30 capsule, Rfl: 11 .  omeprazole (PRILOSEC) 20 MG capsule, Take 1 capsule (20 mg total) by mouth daily with supper., Disp: 90 capsule, Rfl: 1 .  potassium chloride SA (K-DUR,KLOR-CON) 20 MEQ tablet, Take 2 tabs in AM and 1 tab in PM, Disp: 270 tablet, Rfl: 2 .  sertraline (ZOLOFT) 100 MG tablet, TAKE 2 TABLETS BY MOUTH AT BEDTIME, Disp: 180 tablet, Rfl: 3 .  torsemide (DEMADEX) 20 MG tablet, Take 60 mg (3 Tablets) in the AM and 40 mg (2 Tablets) in the PM., Disp: 150 tablet, Rfl: 3 .  venlafaxine XR (EFFEXOR XR) 37.5 MG 24 hr capsule, Take 1 capsule (37.5 mg total) by mouth daily with breakfast., Disp: 30 capsule, Rfl: 1 .  Vitamin D, Ergocalciferol, (DRISDOL) 50000 units CAPS capsule, Take 1  capsule (50,000 Units total) by mouth every 7 (seven) days., Disp: 12 capsule, Rfl: 0 .  warfarin (COUMADIN) 5 MG tablet, Take as directed by anticoagulation clinic, Disp: 30 tablet, Rfl: 3 .  zolpidem (AMBIEN) 10 MG tablet, TAKE 1 TABLET BY MOUTH AT BEDTIME, Disp: 30 tablet, Rfl: 5 .  acetaminophen (TYLENOL) 500 MG tablet, Take 500 mg by mouth every 6 (six) hours as needed., Disp: , Rfl:  .  albuterol (VENTOLIN HFA) 108 (90 Base) MCG/ACT inhaler, INHALE 2 PUFFS 4 TIMES A DAY as needed, Disp: 54 Inhaler, Rfl: 3 .  aspirin-acetaminophen-caffeine (EXCEDRIN MIGRAINE) 250-250-65 MG tablet, , Disp: , Rfl:  .  digoxin (LANOXIN) 0.25 MG tablet, Take 1 tablet (0.25 mg total) by mouth daily., Disp: 30 tablet, Rfl: 11 .  fluticasone (FLONASE) 50 MCG/ACT nasal spray, Place 2 sprays into both nostrils daily as needed for allergies or rhinitis., Disp: 18.2 g, Rfl: 5 .  Fluticasone-Salmeterol (ADVAIR DISKUS) 250-50 MCG/DOSE AEPB, Inhale 1 puff into the lungs 2 (two) times daily., Disp: 180 each, Rfl: 3 .  hydrocortisone 2.5 % cream, Apply 1 application topically at bedtime. itching , Disp: , Rfl:  .  ketoconazole (NIZORAL) 2 % cream, Apply 1 application topically daily., Disp: 15 g, Rfl: 0 .  lidocaine (LIDODERM) 5 %, APPLY 1 PATCH TO AFFECTED AREA FOR UP TO 12 HOURS . DO NOT USE MORE THAN ONE PATCH IN 24 HOURS, Disp: 90 patch, Rfl: 1 .  lisinopril (PRINIVIL,ZESTRIL) 2.5 MG tablet, Take 1 tablet (2.5 mg total) by mouth daily., Disp: 30 tablet, Rfl: 5 .  mometasone (ELOCON) 0.1 % ointment, Apply sparingly to affected areas daily as needed, Disp: 45 g, Rfl: 3 .  niacin 500 MG tablet, , Disp: , Rfl:  .  silver sulfADIAZINE (SILVADENE) 1 % cream, APPLY TO AFFECTED AREA TOPICALLY EVERY DAY, Disp: 50 g, Rfl: 3 .  sulfamethoxazole-trimethoprim (BACTRIM DS,SEPTRA DS) 800-160 MG tablet, Take 1 tablet by mouth 2 (two) times daily., Disp: 20 tablet, Rfl: 0 .  traMADol (ULTRAM) 50 MG tablet, TAKE 1 TABLET BY MOUTH EVERY 6  HOURS AS NEEDED, Disp: 30 tablet, Rfl: 2 .  traZODone (DESYREL) 100 MG tablet, Take 0.5 tablets (50 mg total) by mouth at bedtime as needed., Disp: 45 tablet, Rfl: 1 .  triamcinolone cream (KENALOG) 0.1 %, Apply 1 application topically 3 (three) times daily. Apply to nail folds., Disp: 30 g, Rfl: 0 .  TUMS 500 MG chewable tablet, Chew 500-2,000 mg by mouth every 4 (four) hours as needed for indigestion or heartburn. , Disp: , Rfl:  .  valACYclovir (VALTREX) 1000 MG tablet, Take 1 tablet (1,000 mg total) by mouth 3 (three) times daily., Disp: 21 tablet, Rfl: 2 Allergies  Allergen Reactions  . Amiodarone Other (See Comments)    hyperthyroidism  . Statins Other (See Comments)    myalgia  . Tape Other (See Comments)    Skin Tears Use Paper Tape Only     Social History   Social History  . Marital status: Divorced    Spouse name: N/A  . Number of children: 2  .  Years of education: N/A   Occupational History  . retired/disabled. prev worked in Therapist, sports. Disabled   Social History Main Topics  . Smoking status: Former Smoker    Packs/day: 2.00    Years: 30.00    Types: Cigarettes    Quit date: 10/16/2007  . Smokeless tobacco: Never Used  . Alcohol use No  . Drug use: No  . Sexual activity: Not Currently   Other Topics Concern  . Not on file   Social History Narrative   Lives alone.    Physical Exam  Pulmonary/Chest: No respiratory distress.  Abdominal: He exhibits no distension. There is no guarding.  Musculoskeletal: He exhibits no edema.  Skin: Skin is warm and dry. He is not diaphoretic.        Future Appointments Date Time Provider [place]  07/25/2017 11:00 AM Larey Dresser, MD [facility] None  07/25/2017 3:00 PM [facility] DEVICE REMOTES [facility]  07/26/2017 3:45 PM [facility]  10/18/2017 1:40 PM [name], MD [facility]    ATF pt CAO x4 standing in the kitchen with no complaints.  Pt denies sob, dizziness, and light headedness.  Pt's weight on 2/25 251 and increased to 254 on the 26th.  He stated that he weighed shortly after taking linzess on the 25th.  He usually take linzess every other day.  Pt doesn't think that the weight gain was due to fluid retention.  He has started going to the pain clinic for his shoulder pain, which said is helping so far.  He still have issues with itching.  Pt has been taking his meds as prescribed. rx bottles verified.  BP 98/60   Pulse 88   Resp 16   Wt 254 lb (115.2 kg)   SpO2 98%   BMI 36.45 kg/m   Weight yesterday-254 Last visit weight-255    August Gosser, EMT Paramedic 07/12/2017    ACTION: Home visit completed

## 2017-07-12 ENCOUNTER — Encounter: Payer: Self-pay | Admitting: Internal Medicine

## 2017-07-12 ENCOUNTER — Encounter (INDEPENDENT_AMBULATORY_CARE_PROVIDER_SITE_OTHER): Payer: Self-pay

## 2017-07-12 ENCOUNTER — Ambulatory Visit (INDEPENDENT_AMBULATORY_CARE_PROVIDER_SITE_OTHER): Payer: Medicare HMO | Admitting: Internal Medicine

## 2017-07-12 VITALS — BP 94/70 | HR 68 | Ht 70.0 in | Wt 257.0 lb

## 2017-07-12 DIAGNOSIS — I255 Ischemic cardiomyopathy: Secondary | ICD-10-CM | POA: Diagnosis not present

## 2017-07-12 DIAGNOSIS — I4821 Permanent atrial fibrillation: Secondary | ICD-10-CM

## 2017-07-12 DIAGNOSIS — I5030 Unspecified diastolic (congestive) heart failure: Secondary | ICD-10-CM | POA: Diagnosis not present

## 2017-07-12 DIAGNOSIS — I482 Chronic atrial fibrillation: Secondary | ICD-10-CM

## 2017-07-12 DIAGNOSIS — I428 Other cardiomyopathies: Secondary | ICD-10-CM | POA: Diagnosis not present

## 2017-07-12 DIAGNOSIS — R001 Bradycardia, unspecified: Secondary | ICD-10-CM

## 2017-07-12 DIAGNOSIS — I4510 Unspecified right bundle-branch block: Secondary | ICD-10-CM | POA: Diagnosis not present

## 2017-07-12 LAB — CUP PACEART INCLINIC DEVICE CHECK
Date Time Interrogation Session: 20180928133604
Implantable Pulse Generator Implant Date: 20160622

## 2017-07-12 MED ORDER — DIGOXIN 250 MCG PO TABS: 0.2500 mg | ORAL_TABLET | Freq: Every day | ORAL | 11 refills | Status: DC

## 2017-07-12 NOTE — Patient Instructions (Signed)
Medication Instructions: - Your physician has recommended you make the following change in your medication:  1) START lanoxin (digoxin) 0.25 mg - take 1 tablet by mouth once daily  Labwork: - none ordered  Procedures/Testing: - none ordered  Follow-Up: - Your physician wants you to follow-up in: 1 year with Dr. [name]. You will receive a reminder letter in the mail two months in advance. If you don't receive a letter, please call our office to schedule the follow-up appointment.   Any Additional Special Instructions Will Be Listed Below (If Applicable).     If you need a refill on your cardiac medications before your next appointment, please call your pharmacy.

## 2017-07-12 NOTE — Progress Notes (Addendum)
He was      Patient Care Team: [name], MD as PCP - General Himmelrich, Bryson Ha, RD (Inactive) as Dietitian   HPI  [name] is a 61 y.o. male seen in followup for atrial fibrillation for which he is treated with amiodarone.  There was some concern regarding dyspnea so he underwent pulmonary function testing by Dr. Gwenette Greet;  he also has a history of syncope and is status post ILR insertion 6/16  He continues to struggle with shortness of breath. He also significant fatigue. He has sleep apnea but does not use CPAP. He does use oxygen.    Echocardiogram 7/13 demonstrated EF 20-25% with severe biatrial enlargement. Repeat echo 8/14 and demonstrated an intercurrent normalization of LV function with biatrial enlargement and mild RV dilatation       DATE TEST     /2012 myoview  EF44 Prior IMI perfsion defect  /2015 myoview   EF57% Perfusion defect--MI vs bowel  2016 myoview 35-40 NO perfusion defect  6/16 Echo 40-45 Severe BAE  6/17 Cath 30 Global Hypokinesis With normal CA And normal pulm   3/18 Echo EF20-25%   4/18 LHC/RHC  No CAD   Antiarrhythmics Date  dofetilide 2013  amidoarone 2016   Date Cr K Hgb  8/18 1.22 4.5 12.2                      Past Medical History:  Diagnosis Date  . Anemia    supposed to be taking Vit B but doesn't  . ANXIETY    takes Xanax nightly  . Arthritis   . ASTHMA    Albuterol prn and Advair daily;also takes Prednisone daily  . Asthma   . Atrial fibrillation ([place]) 09/06/2015  . Cardiomyopathy [facility])    a. EF 25% TEE July 2013; b. EF normalized 2015;  c. 03/2015 Echo: EF 40-45%, difrf HK, PASP 38 mmHg, Mild MR, sev LAE/RAE.  [name] Chronic constipation    takes OTC stool softener  . COPD (chronic obstructive pulmonary disease) ([place])   . DEPRESSION    takes Zoloft and Doxepin daily  . Diverticulitis   . DYSKINESIA, ESOPHAGUS   . Essential hypertension       . FIBROMYALGIA   . GERD (gastroesophageal reflux disease)         . Glaucoma   . HYPERLIPIDEMIA    a. Intolerant to statins.  . INSOMNIA    takes Ambien nightly  . Myocardial infarction [facility])    a. 2012 Myoview notable for prior infarct;  b. 03/2015 Lexiscan CL: EF 37%, diff HK, small area of inferior infarct from apex to base-->Med Rx.  . Myocardial infarction ([place])   . O2 dependent    uses oxygen 2 and 1/2 liters per [place] at hs  . Paroxysmal atrial fibrillation ([facility])    a. CHA2DS2VASc = 3--> takes Coumadin;  b. 03/15/2015 Successful TEE/DCCV;  c. 03/2015 recurrent afib, Amio d/c'd in setting of hyperthyroidism.  . Peripheral neuropathy   . Pneumonia 12/2016  . Rash and other nonspecific skin eruption 04/12/2009   no cause found saw dermatologists x 2 and allergist  . SLEEP APNEA, OBSTRUCTIVE    a. doesn't use CPAP  . Syncope    a. 03/2015 s/p MDT LINQ.  [name] Type II diabetes mellitus ([place])         Past Surgical History:  Procedure Laterality Date  . ACNE CYST REMOVAL     2 on back   .  ADENOIDECTOMY    . CARDIAC CATHETERIZATION N/A 03/21/2016   Procedure: Right/Left Heart Cath and Coronary Angiography;  Surgeon: Larey Dresser, MD;  Location: [place] [facility];  Service: Cardiovascular;  Laterality: N/A;  . CARDIOVERSION  04/18/2012   Procedure: CARDIOVERSION;  Surgeon: Fay Records, MD;  Location: [place];  Service: Cardiovascular;  Laterality: N/A;  . CARDIOVERSION  04/25/2012   Procedure: CARDIOVERSION;  Surgeon: Thayer Headings, MD;  Location: [place];  Service: Cardiovascular;  Laterality: N/A;  . CARDIOVERSION  04/25/2012   Procedure: CARDIOVERSION;  Surgeon: Fay Records, MD;  Location: [place];  Service: Cardiovascular;  Laterality: N/A;  . CARDIOVERSION  05/09/2012   Procedure: CARDIOVERSION;  Surgeon: Sherren Mocha, MD;  Location: [place];  Service: Cardiovascular;  Laterality: N/A;  changed from [place] to cooper by trish/leone-endo  . CARDIOVERSION N/A 03/15/2015   Procedure: CARDIOVERSION;  Surgeon: Thayer Headings, MD;  Location: [facility]  ENDOSCOPY;  Service: Cardiovascular;  Laterality: N/A;  . COLONOSCOPY    . COLONOSCOPY WITH PROPOFOL N/A 10/21/2014   Procedure: COLONOSCOPY WITH PROPOFOL;  Surgeon: Ladene Artist, MD;  Location: [facility] ENDOSCOPY;  Service: Endoscopy;  Laterality: N/A;  . EP IMPLANTABLE DEVICE N/A 04/06/2015   Procedure: Loop Recorder Insertion;  Surgeon: Evans Lance, MD;  Location: [place] [facility];  Service: Cardiovascular;  Laterality: N/A;  . ESOPHAGOGASTRODUODENOSCOPY    . RIGHT/LEFT HEART CATH AND CORONARY ANGIOGRAPHY N/A 01/28/2017   Procedure: Right/Left Heart Cath and Coronary Angiography;  Surgeon: Larey Dresser, MD;  Location: [place] [facility];  Service: Cardiovascular;  Laterality: N/A;  . TEE WITHOUT CARDIOVERSION  04/25/2012   Procedure: TRANSESOPHAGEAL ECHOCARDIOGRAM (TEE);  Surgeon: Thayer Headings, MD;  Location: [place];  Service: Cardiovascular;  Laterality: N/A;  . TEE WITHOUT CARDIOVERSION N/A 03/15/2015   Procedure: TRANSESOPHAGEAL ECHOCARDIOGRAM (TEE);  Surgeon: Thayer Headings, MD;  Location: [place];  Service: Cardiovascular;  Laterality: N/A;  . TONSILLECTOMY    . TOTAL KNEE ARTHROPLASTY Right 06/15/2014   Procedure: TOTAL KNEE ARTHROPLASTY;  Surgeon: Renette Butters, MD;  Location: [place];  Service: Orthopedics;  Laterality: Right;    Current Outpatient Prescriptions  Medication Sig Dispense Refill  . acetaminophen (TYLENOL) 500 MG tablet Take 500 mg by mouth every 6 (six) hours as needed.    [name] albuterol (VENTOLIN HFA) 108 (90 Base) MCG/ACT inhaler INHALE 2 PUFFS 4 TIMES A DAY as needed 54 Inhaler 3  . ALPRAZolam (XANAX) 0.5 MG tablet TAKE 1 TO 2 TABLETS BY MOUTH AT BEDTIME AS NEEDED SLEEP 180 tablet 1  . aspirin-acetaminophen-caffeine (EXCEDRIN MIGRAINE) 250-250-65 MG tablet     . carisoprodol (SOMA) 350 MG tablet TAKE 1 TABLET BY MOUTH 3 TIMES A DAY AS NEEDED FOR MUSCLE SPASM 90 tablet 5  . carvedilol (COREG) 6.25 MG tablet Take 1.5 tablets (9.375 mg total) by mouth 2  (two) times daily with a meal. 90 tablet 3  . fluticasone (FLONASE) 50 MCG/ACT nasal spray Place 2 sprays into both nostrils daily as needed for allergies or rhinitis. 18.2 g 5  . Fluticasone-Salmeterol (ADVAIR DISKUS) 250-50 MCG/DOSE AEPB Inhale 1 puff into the lungs 2 (two) times daily. 180 each 3  . hydrocortisone 2.5 % cream Apply 1 application topically at bedtime. itching     . ketoconazole (NIZORAL) 2 % cream Apply 1 application topically daily. 15 g 0  . lidocaine (LIDODERM) 5 % APPLY 1 PATCH TO AFFECTED AREA FOR UP TO 12 HOURS . DO NOT  USE MORE THAN ONE PATCH IN 24 HOURS 90 patch 1  . linaclotide (LINZESS) 145 MCG CAPS capsule Take 1 capsule (145 mcg total) by mouth every other day. 30 capsule 11  . lisinopril (PRINIVIL,ZESTRIL) 2.5 MG tablet Take 1 tablet (2.5 mg total) by mouth daily. 30 tablet 5  . mometasone (ELOCON) 0.1 % ointment Apply sparingly to affected areas daily as needed 45 g 3  . niacin 500 MG tablet     . omeprazole (PRILOSEC) 20 MG capsule Take 1 capsule (20 mg total) by mouth daily with supper. 90 capsule 1  . potassium chloride SA (K-DUR,KLOR-CON) 20 MEQ tablet Take 2 tabs in AM and 1 tab in PM 270 tablet 2  . sertraline (ZOLOFT) 100 MG tablet TAKE 2 TABLETS BY MOUTH AT BEDTIME 180 tablet 3  . silver sulfADIAZINE (SILVADENE) 1 % cream APPLY TO AFFECTED AREA TOPICALLY EVERY DAY 50 g 3  . sulfamethoxazole-trimethoprim (BACTRIM DS,SEPTRA DS) 800-160 MG tablet Take 1 tablet by mouth 2 (two) times daily. 20 tablet 0  . torsemide (DEMADEX) 20 MG tablet Take 60 mg (3 Tablets) in the AM and 40 mg (2 Tablets) in the PM. 150 tablet 3  . traMADol (ULTRAM) 50 MG tablet TAKE 1 TABLET BY MOUTH EVERY 6 HOURS AS NEEDED 30 tablet 2  . traZODone (DESYREL) 100 MG tablet Take 0.5 tablets (50 mg total) by mouth at bedtime as needed. 45 tablet 1  . triamcinolone cream (KENALOG) 0.1 % Apply 1 application topically 3 (three) times daily. Apply to nail folds. 30 g 0  . TUMS 500 MG chewable  tablet Chew 500-2,000 mg by mouth every 4 (four) hours as needed for indigestion or heartburn.     . valACYclovir (VALTREX) 1000 MG tablet Take 1 tablet (1,000 mg total) by mouth 3 (three) times daily. 21 tablet 2  . venlafaxine XR (EFFEXOR XR) 37.5 MG 24 hr capsule Take 1 capsule (37.5 mg total) by mouth daily with breakfast. 30 capsule 1  . Vitamin D, Ergocalciferol, (DRISDOL) 50000 units CAPS capsule Take 1 capsule (50,000 Units total) by mouth every 7 (seven) days. 12 capsule 0  . warfarin (COUMADIN) 5 MG tablet Take as directed by anticoagulation clinic 30 tablet 3  . zolpidem (AMBIEN) 10 MG tablet TAKE 1 TABLET BY MOUTH AT BEDTIME 30 tablet 5   No current facility-administered medications for this visit.     Allergies  Allergen Reactions  . Amiodarone Other (See Comments)    hyperthyroidism  . Statins Other (See Comments)    myalgia  . Tape Other (See Comments)    Skin Tears Use Paper Tape Only    Review of Systems negative except from HPI and PMH  Physical Exam BP 94/70   Pulse 68   Ht 5\' 10"  (1.778 m)   Wt 257 lb (116.6 kg)   SpO2 96%   BMI 36.88 kg/m  Well developed and Morbidly obese in no acute distress HENT normal Neck supple with JVP-flat Carotids brisk and full without bruits Clear Irregularly irregular rate and rhythm with controlled ventricular response, no murmurs or gallops Abd-soft with active BS without hepatomegaly No Clubbing cyanosis 1+edema Skin-warm and dry A & Oriented  Grossly normal sensory and motor function   ECG  afib 79 -/19/44  Assessment and  Plan  Ischemic heart disease withoout significant obstructive disease by catheterization 6/17  HFpEF    Cardiomyopathy-ischemic/nonischemic- resolved   Atrial fibrillation  permanent  RBBB  Bradycardia  Syncope    Atrial  fibrillation is permanent. Rate control remains an issue. Blood pressure precludes up titration of beta blockade. He recalls being on digoxin; it is not in the Epic  extended [facility].  Cardiomyopathy continues to gradually worsen   We will resume  Dr DM has raised the issue of device therapy. He will be a candidate for ICD based on the DANISH TRIAL and possibly resynchronization is very broad right bundle. I  discussed this with Dr. [name] who is in agreement   Euvolemic continue current meds  More than 50% of 40 min was spent in counseling related to the above

## 2017-07-13 DIAGNOSIS — G4733 Obstructive sleep apnea (adult) (pediatric): Secondary | ICD-10-CM | POA: Diagnosis not present

## 2017-07-20 ENCOUNTER — Other Ambulatory Visit: Payer: Self-pay | Admitting: Internal Medicine

## 2017-07-22 ENCOUNTER — Other Ambulatory Visit: Payer: Self-pay | Admitting: Internal Medicine

## 2017-07-22 DIAGNOSIS — I4819 Other persistent atrial fibrillation: Secondary | ICD-10-CM

## 2017-07-22 DIAGNOSIS — I428 Other cardiomyopathies: Secondary | ICD-10-CM

## 2017-07-22 DIAGNOSIS — I255 Ischemic cardiomyopathy: Secondary | ICD-10-CM

## 2017-07-22 NOTE — Telephone Encounter (Signed)
I would not feel comfortable with repeated prednisone, as d/w pt at last visit, to f/u allergy for itching

## 2017-07-24 DIAGNOSIS — J449 Chronic obstructive pulmonary disease, unspecified: Secondary | ICD-10-CM | POA: Diagnosis not present

## 2017-07-25 ENCOUNTER — Other Ambulatory Visit (HOSPITAL_COMMUNITY): Payer: Self-pay

## 2017-07-25 ENCOUNTER — Ambulatory Visit (INDEPENDENT_AMBULATORY_CARE_PROVIDER_SITE_OTHER): Payer: Medicare HMO | Admitting: *Deleted

## 2017-07-25 ENCOUNTER — Ambulatory Visit (HOSPITAL_COMMUNITY)
Admission: RE | Admit: 2017-07-25 | Discharge: 2017-07-25 | Disposition: A | Discharge status: Home / Self Care | Payer: Medicare HMO | Source: Ambulatory Visit | Attending: Cardiology | Admitting: Cardiology

## 2017-07-25 ENCOUNTER — Encounter (HOSPITAL_COMMUNITY): Payer: Self-pay | Admitting: Cardiology

## 2017-07-25 VITALS — BP 118/78 | HR 72 | Wt 256.0 lb

## 2017-07-25 DIAGNOSIS — I481 Persistent atrial fibrillation: Secondary | ICD-10-CM

## 2017-07-25 DIAGNOSIS — I38 Endocarditis, valve unspecified: Secondary | ICD-10-CM

## 2017-07-25 DIAGNOSIS — Z8371 Family history of colonic polyps: Secondary | ICD-10-CM | POA: Diagnosis not present

## 2017-07-25 DIAGNOSIS — Z7901 Long term (current) use of anticoagulants: Secondary | ICD-10-CM | POA: Insufficient documentation

## 2017-07-25 DIAGNOSIS — Z7982 Long term (current) use of aspirin: Secondary | ICD-10-CM | POA: Insufficient documentation

## 2017-07-25 DIAGNOSIS — Z87891 Personal history of nicotine dependence: Secondary | ICD-10-CM | POA: Diagnosis not present

## 2017-07-25 DIAGNOSIS — Z9981 Dependence on supplemental oxygen: Secondary | ICD-10-CM | POA: Insufficient documentation

## 2017-07-25 DIAGNOSIS — I4510 Unspecified right bundle-branch block: Secondary | ICD-10-CM | POA: Insufficient documentation

## 2017-07-25 DIAGNOSIS — E785 Hyperlipidemia, unspecified: Secondary | ICD-10-CM | POA: Insufficient documentation

## 2017-07-25 DIAGNOSIS — N400 Enlarged prostate without lower urinary tract symptoms: Secondary | ICD-10-CM | POA: Insufficient documentation

## 2017-07-25 DIAGNOSIS — I4819 Other persistent atrial fibrillation: Secondary | ICD-10-CM

## 2017-07-25 DIAGNOSIS — J449 Chronic obstructive pulmonary disease, unspecified: Secondary | ICD-10-CM | POA: Insufficient documentation

## 2017-07-25 DIAGNOSIS — I110 Hypertensive heart disease with heart failure: Secondary | ICD-10-CM | POA: Diagnosis not present

## 2017-07-25 DIAGNOSIS — I2510 Atherosclerotic heart disease of native coronary artery without angina pectoris: Secondary | ICD-10-CM | POA: Insufficient documentation

## 2017-07-25 DIAGNOSIS — I482 Chronic atrial fibrillation: Secondary | ICD-10-CM | POA: Diagnosis not present

## 2017-07-25 DIAGNOSIS — R55 Syncope and collapse: Secondary | ICD-10-CM | POA: Diagnosis not present

## 2017-07-25 DIAGNOSIS — I5022 Chronic systolic (congestive) heart failure: Secondary | ICD-10-CM | POA: Insufficient documentation

## 2017-07-25 DIAGNOSIS — K219 Gastro-esophageal reflux disease without esophagitis: Secondary | ICD-10-CM | POA: Insufficient documentation

## 2017-07-25 DIAGNOSIS — M791 Myalgia, unspecified site: Secondary | ICD-10-CM | POA: Diagnosis not present

## 2017-07-25 LAB — BASIC METABOLIC PANEL
Anion gap: 8 (ref 5–15)
BUN: 33 mg/dL — AB (ref 6–20)
CALCIUM: 9.4 mg/dL (ref 8.9–10.3)
CHLORIDE: 105 mmol/L (ref 101–111)
CO2: 26 mmol/L (ref 22–32)
CREATININE: 1.14 mg/dL (ref 0.61–1.24)
GFR calc Af Amer: >60 mL/min (ref >60)
GFR calc non Af Amer: >60 mL/min (ref >60)
Glucose, Bld: 74 mg/dL (ref 65–99)
Potassium: 4.5 mmol/L (ref 3.5–5.1)
SODIUM: 139 mmol/L (ref 135–145)

## 2017-07-25 LAB — CBC
HCT: 37.7 % — ABNORMAL LOW (ref 39.0–52.0)
Hemoglobin: 12.4 g/dL — ABNORMAL LOW (ref 13.0–17.0)
MCH: 33.2 pg (ref 26.0–34.0)
MCHC: 32.9 g/dL (ref 30.0–36.0)
MCV: 101.1 fL — AB (ref 78.0–100.0)
PLATELETS: 175 10*3/uL (ref 150–400)
RBC: 3.73 MIL/uL — ABNORMAL LOW (ref 4.22–5.81)
RDW: 13.3 % (ref 11.5–15.5)
WBC: 7 10*3/uL (ref 4.0–10.5)

## 2017-07-25 LAB — DIGOXIN LEVEL: DIGOXIN LVL: 0.4 ng/mL — AB (ref 0.8–2.0)

## 2017-07-25 MED ORDER — POTASSIUM CHLORIDE CRYS ER 20 MEQ PO TBCR: 40.0000 meq | EXTENDED_RELEASE_TABLET | Freq: Every day | ORAL | 3 refills | Status: DC

## 2017-07-25 MED ORDER — SPIRONOLACTONE 25 MG PO TABS: 12.5000 mg | ORAL_TABLET | Freq: Every day | ORAL | 3 refills | Status: DC

## 2017-07-25 MED ORDER — DIGOXIN 250 MCG PO TABS: 0.1250 mg | ORAL_TABLET | Freq: Every day | ORAL | 11 refills | Status: DC

## 2017-07-25 NOTE — Progress Notes (Signed)
PCP: Dr. [name] EP: [name] HF Cardiology: [name]  61 yo with history of COPD on home oxygen, permanent atrial fibrillation, and chronic systolic CHF was referred by Dr. [name] for evaluation of CHF.  Amiodarone and DCCV were tried for atrial fibrillation in the past without success.   Patient has been noted to have a low EF since 2013.  EF improved by to normal by 2015 but dropped to 30-35% by 11/16.  Cath in 6/17 showed some CAD but not enough to cause cardiomyopathy.  He was admitted in 3/18 with PNA, atrial fibrillation/RVR, and acute on chronic systolic CHF.  Echo in 3/18 showed fall in EF to 10-15%.   I took him for right and left heart cath in 4/18, which showed nonobstructive CAD and relatively optimized filling pressures.    Patient is seen today in followup of CHF/dyspnea. He is using oxygen only at night now for the most part.  No chest pain.  He has been sleeping in a recliner long-term.  Has OSA but cannot tolerate CPAP.  No lightheadedness with standing.  He remains in atrial fibrillation, feels palpitations occasionally but rate is controlled.  No dyspnea walking on flat ground.  He has a chronic skin rash, told by dermatology at [facility] that it is chronic spongiotic dermatitis.  He saw Dr. [name] who thought he would be a reasonable candidate for CRT-D given very wide LBBB.   Labs (4/18): pro-BNP 904, K 3.1, creatinine 1.39 => 1.08 with BUN 66 => 37, hgb 14.1.  Labs (5/18): hemoglobin 13.1 Labs (6/18): K 4, creatinine 1.3, BNP 294 Labs (7/18): K 3.8, creatinine 1.23, LDL 96, HDL 42 Labs (8/18): K 4.5, creatinine 1.22  ECG (9/18, personally reviewed): atrial fibrillation, LAFB, RBBB 188 msec  PMH: 1. Asthma 2. COPD: On home oxygen. Prior smoker.  3. Atrial fibrillation: Permanent.  He failed DCCV and amiodarone in the past . 4. HTN 5. GERD 6. Hyperlipidemia 7. BPH.  8. Chronic systolic CHF: Nonischemic cardiomyopathy.  - EF 25% by TEE in 7/13.  Normalized on 2015 echo. - Echo  (11/16): EF 30-35%.  - LHC/RHC (6/17): 80% ostial stenosis small D1, 30-40% pLAD.  Mean RA 4, PA 26/10, mean PCWP 11, CI 2.33.  - Echo (3/18): EF 10-15%, diffuse hypokinesis, severe LV dilation, moderate RV dilation with severely decreased systolic function.  - LHC/RHC (4/18): 40% proximal LAD.  Mean RA 8, PA 37/10 mean 22, mean PCWP 22, LVEDP 9, CI 2.39. 9. Morbid obesity 10. ILR in place.  11. Nonhealing spongiotic dermatitis  Social History   Social History  . Marital status: Divorced    Spouse name: N/A  . Number of children: 2  . Years of education: N/A   Occupational History  . retired/disabled. prev worked in Therapist, sports. Disabled   Social History Main Topics  . Smoking status: Former Smoker    Packs/day: 2.00    Years: 30.00    Types: Cigarettes    Quit date: 10/16/2007  . Smokeless tobacco: Never Used  . Alcohol use No  . Drug use: No  . Sexual activity: Not Currently   Other Topics Concern  . Not on file   Social History Narrative   Lives alone.   Family History  Problem Relation Age of Onset  . COPD Mother   . Asthma Mother   . Colon polyps Mother   . Allergies Mother   . Hypothyroidism Mother   . Asthma Maternal Grandmother   . Colon cancer Neg Hx  ROS: All systems reviewed and negative except as per HPI.   Current Outpatient Prescriptions  Medication Sig Dispense Refill  . acetaminophen (TYLENOL) 500 MG tablet Take 500 mg by mouth every 6 (six) hours as needed.    [name] albuterol (VENTOLIN HFA) 108 (90 Base) MCG/ACT inhaler INHALE 2 PUFFS 4 TIMES A DAY as needed 54 Inhaler 3  . ALPRAZolam (XANAX) 0.5 MG tablet TAKE 1 TO 2 TABLETS BY MOUTH AT BEDTIME AS NEEDED SLEEP 180 tablet 1  . aspirin-acetaminophen-caffeine (EXCEDRIN MIGRAINE) 250-250-65 MG tablet     . carisoprodol (SOMA) 350 MG tablet TAKE 1 TABLET BY MOUTH 3 TIMES A DAY AS NEEDED FOR MUSCLE SPASM 90 tablet 5  . carvedilol (COREG) 6.25 MG tablet Take 1.5 tablets (9.375 mg total) by mouth 2  (two) times daily with a meal. 90 tablet 3  . digoxin (LANOXIN) 0.25 MG tablet Take 0.5 tablets (0.125 mg total) by mouth daily. 30 tablet 11  . fluticasone (FLONASE) 50 MCG/ACT nasal spray Place 2 sprays into both nostrils daily as needed for allergies or rhinitis. 18.2 g 5  . Fluticasone-Salmeterol (ADVAIR DISKUS) 250-50 MCG/DOSE AEPB Inhale 1 puff into the lungs 2 (two) times daily. 180 each 3  . hydrocortisone 2.5 % cream Apply 1 application topically at bedtime. itching     . ketoconazole (NIZORAL) 2 % cream Apply 1 application topically daily. 15 g 0  . lidocaine (LIDODERM) 5 % APPLY 1 PATCH TO AFFECTED AREA FOR UP TO 12 HOURS . DO NOT USE MORE THAN ONE PATCH IN 24 HOURS 90 patch 1  . linaclotide (LINZESS) 145 MCG CAPS capsule Take 1 capsule (145 mcg total) by mouth every other day. 30 capsule 11  . mometasone (ELOCON) 0.1 % ointment Apply sparingly to affected areas daily as needed 45 g 3  . niacin 500 MG tablet     . omeprazole (PRILOSEC) 20 MG capsule Take 1 capsule (20 mg total) by mouth daily with supper. 90 capsule 1  . potassium chloride SA (K-DUR,KLOR-CON) 20 MEQ tablet Take 2 tablets (40 mEq total) by mouth daily. 60 tablet 3  . sertraline (ZOLOFT) 100 MG tablet TAKE 2 TABLETS BY MOUTH AT BEDTIME 180 tablet 3  . silver sulfADIAZINE (SILVADENE) 1 % cream APPLY TO AFFECTED AREA TOPICALLY EVERY DAY 50 g 3  . sulfamethoxazole-trimethoprim (BACTRIM DS,SEPTRA DS) 800-160 MG tablet Take 1 tablet by mouth 2 (two) times daily. 20 tablet 0  . torsemide (DEMADEX) 20 MG tablet Take 60 mg (3 Tablets) in the AM and 40 mg (2 Tablets) in the PM. 150 tablet 3  . traMADol (ULTRAM) 50 MG tablet TAKE 1 TABLET BY MOUTH EVERY 6 HOURS AS NEEDED 30 tablet 2  . traZODone (DESYREL) 100 MG tablet Take 0.5 tablets (50 mg total) by mouth at bedtime as needed. 45 tablet 1  . triamcinolone cream (KENALOG) 0.1 % Apply 1 application topically 3 (three) times daily. Apply to nail folds. 30 g 0  . TUMS 500 MG  chewable tablet Chew 500-2,000 mg by mouth every 4 (four) hours as needed for indigestion or heartburn.     . valACYclovir (VALTREX) 1000 MG tablet Take 1 tablet (1,000 mg total) by mouth 3 (three) times daily. 21 tablet 2  . venlafaxine XR (EFFEXOR XR) 37.5 MG 24 hr capsule Take 1 capsule (37.5 mg total) by mouth daily with breakfast. 30 capsule 1  . Vitamin D, Ergocalciferol, (DRISDOL) 50000 units CAPS capsule Take 1 capsule (50,000 Units total) by mouth every  7 (seven) days. 12 capsule 0  . warfarin (COUMADIN) 5 MG tablet Take as directed by anticoagulation clinic 30 tablet 3  . lisinopril (PRINIVIL,ZESTRIL) 2.5 MG tablet Take 1 tablet (2.5 mg total) by mouth daily. 30 tablet 5  . spironolactone (ALDACTONE) 25 MG tablet Take 0.5 tablets (12.5 mg total) by mouth at bedtime. 15 tablet 3  . zolpidem (AMBIEN) 10 MG tablet TAKE 1 TABLET BY MOUTH AT BEDTIME (Patient not taking: Reported on 07/25/2017) 30 tablet 5   No current facility-administered medications for this encounter.    BP 118/78   Pulse 72   Wt 256 lb (116.1 kg)   SpO2 97%   BMI 36.73 kg/m  General: NAD Neck: JVP not elevated, no thyromegaly or thyroid nodule.  Lungs: Clear to auscultation bilaterally with normal respiratory effort. CV: Nondisplaced PMI.  Heart irregular S1/S2, no S3/S4, no murmur.  No peripheral edema.  No carotid bruit.  Normal pedal pulses.  Abdomen: Soft, nontender, no hepatosplenomegaly, no distention.  Skin: patchy red rash.   Neurologic: Alert and oriented x 3.  Psych: Normal affect. Extremities: No clubbing or cyanosis.  HEENT: Normal.   Assessment/Plan:  1. Atrial fibrillation: Permanent, good rate control.   - Continue Coreg and warfarin.  2. COPD: No longer smoking.  He is on home oxygen at night.   3. Chronic systolic CHF: Nonischemic cardiomyopathy based on 6/17 cath. However, he had a recent significant fall in EF from 30-35% to 10-15% on last echo from 3/18.  LHC/RHC in 4/18 showed  nonobstructive CAD and relatively optimized filling pressures.  Weight is down 6 lbs.  He is not volume overloaded on exam.  NYHA class II symptoms.  - Continue Coreg 9.375 mg bid.  - Start spironolactone 12.5 qhs.  BMET today and again in 10 days.  Decrease KCl to 40 mEq once daily.  - Continue current torsemide.     - Continue low dose lisinopril in the evening. - Continue digoxin but can decrease to 0.125 daily.  Check level.    - He qualifies for an ICD at this point with long-term low EF.  Nonischemic, so not as marked a benefit but age < 14.  He has a wide RBBB, 188 msec on last ECG.  Dr. [name] at last appointment was willing to consider him for CRT-D.  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 Will send Dr. [name] a message to see if we can get this scheduled.  3. COPD: Continue home oxygen.  4. CAD: Nonobstructive on 4/18 cath.  No exertional chest pain.  - He is on warfarin so no ASA.  - Myalgias with multiple statins, unable to tolerate.   Loralie Champagne 07/25/2017

## 2017-07-25 NOTE — Progress Notes (Signed)
Carelink Summary Report / Loop Recorder 

## 2017-07-25 NOTE — Progress Notes (Signed)
Paramedicine Encounter   Patient ID: [name] , male,   DOB: 02/04/1956,61 y.o.,  MRN: [id number]   Met patient in clinic today with provider Mclean.   Talked with pt about Dr. Jens Som last notes about getting a defibrillator.  Pt is considering to have it done. He asked about the amount of time he will be in the hospital (over night).  Pacemaker/defib is the consideration at this time.  Pt has palpations at night sometimes.  He still use oxygen to sleep and is sleeping in the recliner.  He showed Dr. [name] the skin irritation on his back and stomach.  "lUV light treatment" maybe his best option.  Doesn't appear to be allergy related. He advised him to f/u with a dermtologlist.        *medication change:  decrease digoxin to .125 Begin spirolactone (low dose at night)  Time spent with patient 35 mins  Channa Hazelett, EMT-Paramedic 07/25/2017   ACTION: Next visit planned for oct 25th

## 2017-07-25 NOTE — Patient Instructions (Signed)
Decrease Digoxin 0.125 mg daily  Decrease Potassium 40 meq (2 tabs) daily  Start Spironalactone 12.5 mg (1/2 tab) every night  Labs drawn today (if we do not call you, then your lab work was stable)   Your physician recommends that you return for lab work in: 10 days  Your physician recommends that you schedule a follow-up appointment in: 4 months ( we will call you, or call us in January, 2019)

## 2017-07-26 ENCOUNTER — Encounter: Payer: Self-pay | Admitting: Internal Medicine

## 2017-07-26 ENCOUNTER — Ambulatory Visit (INDEPENDENT_AMBULATORY_CARE_PROVIDER_SITE_OTHER): Payer: Medicare HMO | Admitting: Internal Medicine

## 2017-07-26 ENCOUNTER — Ambulatory Visit: Payer: Medicare HMO | Admitting: General Practice

## 2017-07-26 VITALS — BP 112/76 | HR 63 | Temp 98.3°F | Ht 70.0 in | Wt 257.0 lb

## 2017-07-26 DIAGNOSIS — I10 Essential (primary) hypertension: Secondary | ICD-10-CM | POA: Diagnosis not present

## 2017-07-26 DIAGNOSIS — L299 Pruritus, unspecified: Secondary | ICD-10-CM | POA: Diagnosis not present

## 2017-07-26 DIAGNOSIS — Z7901 Long term (current) use of anticoagulants: Secondary | ICD-10-CM

## 2017-07-26 DIAGNOSIS — L308 Other specified dermatitis: Secondary | ICD-10-CM | POA: Diagnosis not present

## 2017-07-26 DIAGNOSIS — E119 Type 2 diabetes mellitus without complications: Secondary | ICD-10-CM

## 2017-07-26 LAB — POCT INR: INR: 2.1

## 2017-07-26 MED ORDER — METHYLPREDNISOLONE ACETATE 80 MG/ML IJ SUSP
80.0000 mg | Freq: Once | INTRAMUSCULAR | Status: AC
  Administered 2017-07-26: 80 mg via INTRAMUSCULAR

## 2017-07-26 MED ORDER — PREDNISONE 10 MG PO TABS: ORAL_TABLET | ORAL | 0 refills | Status: DC

## 2017-07-26 NOTE — Progress Notes (Signed)
Subjective:    Patient ID: [name], male    DOB: 11-26-1955, 61 y.o.   MRN: [id number]  HPI  Here with c/o rather dramatic flare of his chronic spongiotic nonhealing dermatitis, with numerous lesions to torso and extremities up to 3 cm size, all itchy and has scratched with drawing of blood.  No fever,  Pt denies chest pain, increased sob or doe, wheezing, orthopnea, PND, increased LE swelling, palpitations, dizziness or syncope.  Pt denies new neurological symptoms such as new headache, or facial or extremity weakness or numbness   Pt denies polydipsia, polyuria Past Medical History:  Diagnosis Date  . Anemia    supposed to be taking Vit B but doesn't  . ANXIETY    takes Xanax nightly  . Arthritis   . ASTHMA    Albuterol prn and Advair daily;also takes Prednisone daily  . Asthma   . Atrial fibrillation ([place]) 09/06/2015  . Cardiomyopathy [facility])    a. EF 25% TEE July 2013; b. EF normalized 2015;  c. 03/2015 Echo: EF 40-45%, difrf HK, PASP 38 mmHg, Mild MR, sev LAE/RAE.  [name] Chronic constipation    takes OTC stool softener  . COPD (chronic obstructive pulmonary disease) ([place])   . DEPRESSION    takes Zoloft and Doxepin daily  . Diverticulitis   . DYSKINESIA, ESOPHAGUS   . Essential hypertension       . FIBROMYALGIA   . GERD (gastroesophageal reflux disease)       . Glaucoma   . HYPERLIPIDEMIA    a. Intolerant to statins.  . INSOMNIA    takes Ambien nightly  . Myocardial infarction [facility])    a. 2012 Myoview notable for prior infarct;  b. 03/2015 Lexiscan CL: EF 37%, diff HK, small area of inferior infarct from apex to base-->Med Rx.  . Myocardial infarction ([place])   . O2 dependent    uses oxygen 2 and 1/2 liters per [place] at hs  . Paroxysmal atrial fibrillation ([facility])    a. CHA2DS2VASc = 3--> takes Coumadin;  b. 03/15/2015 Successful TEE/DCCV;  c. 03/2015 recurrent afib, Amio d/c'd in setting of hyperthyroidism.  . Peripheral neuropathy   . Pneumonia 12/2016  . Rash and other  nonspecific skin eruption 04/12/2009   no cause found saw dermatologists x 2 and allergist  . SLEEP APNEA, OBSTRUCTIVE    a. doesn't use CPAP  . Syncope    a. 03/2015 s/p MDT LINQ.  [name] Type II diabetes mellitus ([place])        Past Surgical History:  Procedure Laterality Date  . ACNE CYST REMOVAL     2 on back   . ADENOIDECTOMY    . CARDIAC CATHETERIZATION N/A 03/21/2016   Procedure: Right/Left Heart Cath and Coronary Angiography;  Surgeon: Larey Dresser, MD;  Location: [place] CV LAB;  Service: Cardiovascular;  Laterality: N/A;  . CARDIOVERSION  04/18/2012   Procedure: CARDIOVERSION;  Surgeon: Fay Records, MD;  Location: [place];  Service: Cardiovascular;  Laterality: N/A;  . CARDIOVERSION  04/25/2012   Procedure: CARDIOVERSION;  Surgeon: Thayer Headings, MD;  Location: [place];  Service: Cardiovascular;  Laterality: N/A;  . CARDIOVERSION  04/25/2012   Procedure: CARDIOVERSION;  Surgeon: Fay Records, MD;  Location: [place];  Service: Cardiovascular;  Laterality: N/A;  . CARDIOVERSION  05/09/2012   Procedure: CARDIOVERSION;  Surgeon: Sherren Mocha, MD;  Location: [place];  Service: Cardiovascular;  Laterality: N/A;  changed from crenshaw to cooper by trish/leone-endo  .  CARDIOVERSION N/A 03/15/2015   Procedure: CARDIOVERSION;  Surgeon: Thayer Headings, MD;  Location: [facility] ENDOSCOPY;  Service: Cardiovascular;  Laterality: N/A;  . COLONOSCOPY    . COLONOSCOPY WITH PROPOFOL N/A 10/21/2014   Procedure: COLONOSCOPY WITH PROPOFOL;  Surgeon: Ladene Artist, MD;  Location: [facility] ENDOSCOPY;  Service: Endoscopy;  Laterality: N/A;  . EP IMPLANTABLE DEVICE N/A 04/06/2015   Procedure: Loop Recorder Insertion;  Surgeon: Evans Lance, MD;  Location: [place] [facility];  Service: Cardiovascular;  Laterality: N/A;  . ESOPHAGOGASTRODUODENOSCOPY    . RIGHT/LEFT HEART CATH AND CORONARY ANGIOGRAPHY N/A 01/28/2017   Procedure: Right/Left Heart Cath and Coronary Angiography;  Surgeon: Larey Dresser, MD;  Location:  [place] [facility];  Service: Cardiovascular;  Laterality: N/A;  . TEE WITHOUT CARDIOVERSION  04/25/2012   Procedure: TRANSESOPHAGEAL ECHOCARDIOGRAM (TEE);  Surgeon: Thayer Headings, MD;  Location: [place];  Service: Cardiovascular;  Laterality: N/A;  . TEE WITHOUT CARDIOVERSION N/A 03/15/2015   Procedure: TRANSESOPHAGEAL ECHOCARDIOGRAM (TEE);  Surgeon: Thayer Headings, MD;  Location: [place];  Service: Cardiovascular;  Laterality: N/A;  . TONSILLECTOMY    . TOTAL KNEE ARTHROPLASTY Right 06/15/2014   Procedure: TOTAL KNEE ARTHROPLASTY;  Surgeon: Renette Butters, MD;  Location: [place];  Service: Orthopedics;  Laterality: Right;    reports that he quit smoking about 9 years ago. His smoking use included Cigarettes. He has a 60.00 pack-year smoking history. He has never used smokeless tobacco. He reports that he does not drink alcohol or use drugs. family history includes Allergies in his mother; Asthma in his maternal grandmother and mother; COPD in his mother; Colon polyps in his mother; Hypothyroidism in his mother. Allergies  Allergen Reactions  . Amiodarone Other (See Comments)    hyperthyroidism  . Statins Other (See Comments)    myalgia  . Tape Other (See Comments)    Skin Tears Use Paper Tape Only   Current Outpatient Prescriptions on File Prior to Visit  Medication Sig Dispense Refill  . acetaminophen (TYLENOL) 500 MG tablet Take 500 mg by mouth every 6 (six) hours as needed.    [name] albuterol (VENTOLIN HFA) 108 (90 Base) MCG/ACT inhaler INHALE 2 PUFFS 4 TIMES A DAY as needed 54 Inhaler 3  . ALPRAZolam (XANAX) 0.5 MG tablet TAKE 1 TO 2 TABLETS BY MOUTH AT BEDTIME AS NEEDED SLEEP 180 tablet 1  . aspirin-acetaminophen-caffeine (EXCEDRIN MIGRAINE) 250-250-65 MG tablet     . carisoprodol (SOMA) 350 MG tablet TAKE 1 TABLET BY MOUTH 3 TIMES A DAY AS NEEDED FOR MUSCLE SPASM 90 tablet 5  . carvedilol (COREG) 6.25 MG tablet Take 1.5 tablets (9.375 mg total) by mouth 2 (two) times daily  with a meal. 90 tablet 3  . digoxin (LANOXIN) 0.25 MG tablet Take 0.5 tablets (0.125 mg total) by mouth daily. 30 tablet 11  . fluticasone (FLONASE) 50 MCG/ACT nasal spray Place 2 sprays into both nostrils daily as needed for allergies or rhinitis. 18.2 g 5  . Fluticasone-Salmeterol (ADVAIR DISKUS) 250-50 MCG/DOSE AEPB Inhale 1 puff into the lungs 2 (two) times daily. 180 each 3  . hydrocortisone 2.5 % cream Apply 1 application topically at bedtime. itching     . ketoconazole (NIZORAL) 2 % cream Apply 1 application topically daily. 15 g 0  . lidocaine (LIDODERM) 5 % APPLY 1 PATCH TO AFFECTED AREA FOR UP TO 12 HOURS . DO NOT USE MORE THAN ONE PATCH IN 24 HOURS 90 patch 1  . linaclotide (LINZESS)  145 MCG CAPS capsule Take 1 capsule (145 mcg total) by mouth every other day. 30 capsule 11  . mometasone (ELOCON) 0.1 % ointment Apply sparingly to affected areas daily as needed 45 g 3  . niacin 500 MG tablet     . omeprazole (PRILOSEC) 20 MG capsule Take 1 capsule (20 mg total) by mouth daily with supper. 90 capsule 1  . potassium chloride SA (K-DUR,KLOR-CON) 20 MEQ tablet Take 2 tablets (40 mEq total) by mouth daily. 60 tablet 3  . sertraline (ZOLOFT) 100 MG tablet TAKE 2 TABLETS BY MOUTH AT BEDTIME 180 tablet 3  . silver sulfADIAZINE (SILVADENE) 1 % cream APPLY TO AFFECTED AREA TOPICALLY EVERY DAY 50 g 3  . spironolactone (ALDACTONE) 25 MG tablet Take 0.5 tablets (12.5 mg total) by mouth at bedtime. 15 tablet 3  . sulfamethoxazole-trimethoprim (BACTRIM DS,SEPTRA DS) 800-160 MG tablet Take 1 tablet by mouth 2 (two) times daily. 20 tablet 0  . torsemide (DEMADEX) 20 MG tablet Take 60 mg (3 Tablets) in the AM and 40 mg (2 Tablets) in the PM. 150 tablet 3  . traMADol (ULTRAM) 50 MG tablet TAKE 1 TABLET BY MOUTH EVERY 6 HOURS AS NEEDED 30 tablet 2  . traZODone (DESYREL) 100 MG tablet Take 0.5 tablets (50 mg total) by mouth at bedtime as needed. 45 tablet 1  . triamcinolone cream (KENALOG) 0.1 % Apply 1  application topically 3 (three) times daily. Apply to nail folds. 30 g 0  . TUMS 500 MG chewable tablet Chew 500-2,000 mg by mouth every 4 (four) hours as needed for indigestion or heartburn.     . valACYclovir (VALTREX) 1000 MG tablet Take 1 tablet (1,000 mg total) by mouth 3 (three) times daily. 21 tablet 2  . venlafaxine XR (EFFEXOR XR) 37.5 MG 24 hr capsule Take 1 capsule (37.5 mg total) by mouth daily with breakfast. 30 capsule 1  . Vitamin D, Ergocalciferol, (DRISDOL) 50000 units CAPS capsule Take 1 capsule (50,000 Units total) by mouth every 7 (seven) days. 12 capsule 0  . warfarin (COUMADIN) 5 MG tablet Take as directed by anticoagulation clinic 30 tablet 3  . zolpidem (AMBIEN) 10 MG tablet TAKE 1 TABLET BY MOUTH AT BEDTIME 30 tablet 5  . lisinopril (PRINIVIL,ZESTRIL) 2.5 MG tablet Take 1 tablet (2.5 mg total) by mouth daily. 30 tablet 5  . [DISCONTINUED] atorvastatin (LIPITOR) 20 MG tablet Take 1 tablet (20 mg total) by mouth daily. 30 tablet 11  . [DISCONTINUED] gabapentin (NEURONTIN) 300 MG capsule Take 1 capsule (300 mg total) by mouth 3 (three) times daily. 90 capsule 5   No current facility-administered medications on file prior to visit.    Review of Systems  Constitutional: Negative for other unusual diaphoresis or sweats HENT: Negative for ear discharge or swelling Eyes: Negative for other worsening visual disturbances Respiratory: Negative for stridor or other swelling  Gastrointestinal: Negative for worsening distension or other blood Genitourinary: Negative for retention or other urinary change Musculoskeletal: Negative for other MSK pain or swelling Skin: Negative for color change or other new lesions Neurological: Negative for worsening tremors and other numbness  Psychiatric/Behavioral: Negative for worsening agitation or other fatigue All other system neg per pt    Objective:   Physical Exam BP 112/76   Pulse 63   Temp 98.3 F (36.8 C) (Oral)   Ht 5\' 10"  (1.778  m)   Wt 257 lb (116.6 kg)   SpO2 95%   BMI 36.88 kg/m  VS noted,  Constitutional: Pt appears in NAD HENT: Head: NCAT.  Right Ear: External ear normal.  Left Ear: External ear normal.  Eyes: . Pupils are equal, round, and reactive to light. Conjunctivae and EOM are normal Nose: without d/c or deformity Neck: Neck supple. Gross normal ROM Cardiovascular: Normal rate and regular rhythm.   Pulmonary/Chest: Effort normal and breath sounds without rales or wheezing.  Abd:  Soft, NT, ND, + BS, no organomegaly Neurological: Pt is alert. At baseline orientation, motor grossly intact Skin: Skin is warm. Has numerous flat nontender erythema lesions to torso and extremities variable sized up to 3 cm area to the lower back, with all to the thighs with excoriations Psychiatric: Pt behavior is normal without agitation  No other exam findings Lab Results  Component Value Date   WBC 7.0 07/25/2017   HGB 12.4 (L) 07/25/2017   HCT 37.7 (L) 07/25/2017   PLT 175 07/25/2017   GLUCOSE 74 07/25/2017   CHOL 159 04/18/2017   TRIG 104.0 04/18/2017   HDL 41.80 04/18/2017   LDLDIRECT 111.0 05/30/2016   LDLCALC 96 04/18/2017   ALT 13 05/09/2017   AST 16 05/09/2017   NA 139 07/25/2017   K 4.5 07/25/2017   CL 105 07/25/2017   CREATININE 1.14 07/25/2017   BUN 33 (H) 07/25/2017   CO2 26 07/25/2017   TSH 3.28 04/18/2017   PSA 3.29 04/18/2017   INR 2.1 07/26/2017   HGBA1C 5.8 04/18/2017   MICROALBUR 3.0 (H) 04/18/2017      Assessment & Plan:

## 2017-07-26 NOTE — Assessment & Plan Note (Signed)
stable overall by history and exam, recent data reviewed with pt, and pt to continue medical treatment as before,  to f/u any worsening symptoms or concerns Lab Results  Component Value Date   HGBA1C 5.8 04/18/2017

## 2017-07-26 NOTE — Patient Instructions (Signed)
You had the steroid shot today  Please take all new medication as prescribed - the prednisone  Please continue all other medications as before, and refills have been done if requested.  Please have the pharmacy call with any other refills you may need.  Please continue your efforts at being more active, low cholesterol diet, and weight control.  Please keep your appointments with your specialists as you may have planned   

## 2017-07-26 NOTE — Assessment & Plan Note (Signed)
stable overall by history and exam, recent data reviewed with pt, and pt to continue medical treatment as before,  to f/u any worsening symptoms or concerns BP Readings from Last 3 Encounters:  07/26/17 112/76  07/25/17 118/78  07/12/17 94/70

## 2017-07-26 NOTE — Assessment & Plan Note (Signed)
Now ongoing x 3 yrs, has tried numerous creams and tx per allergy and [facility] dermatology, ok for depomedrol IM 80, predpac asd,  to f/u any worsening symptoms or concerns

## 2017-07-30 LAB — CUP PACEART REMOTE DEVICE CHECK
Implantable Pulse Generator Implant Date: 20160622
MDC IDC SESS DTM: 20181011120950

## 2017-08-05 ENCOUNTER — Ambulatory Visit (HOSPITAL_COMMUNITY)
Admission: RE | Admit: 2017-08-05 | Discharge: 2017-08-05 | Disposition: A | Discharge status: Home / Self Care | Payer: Medicare HMO | Source: Ambulatory Visit | Attending: Internal Medicine | Admitting: Internal Medicine

## 2017-08-05 DIAGNOSIS — I38 Endocarditis, valve unspecified: Secondary | ICD-10-CM | POA: Insufficient documentation

## 2017-08-05 DIAGNOSIS — I5022 Chronic systolic (congestive) heart failure: Secondary | ICD-10-CM | POA: Diagnosis not present

## 2017-08-05 LAB — BASIC METABOLIC PANEL
Anion gap: 6 (ref 5–15)
BUN: 28 mg/dL — ABNORMAL HIGH (ref 6–20)
CALCIUM: 9.1 mg/dL (ref 8.9–10.3)
CO2: 23 mmol/L (ref 22–32)
CREATININE: 1.08 mg/dL (ref 0.61–1.24)
Chloride: 107 mmol/L (ref 101–111)
GFR calc non Af Amer: >60 mL/min (ref >60)
GLUCOSE: 89 mg/dL (ref 65–99)
Potassium: 4 mmol/L (ref 3.5–5.1)
Sodium: 136 mmol/L (ref 135–145)

## 2017-08-07 ENCOUNTER — Telehealth: Payer: Self-pay | Admitting: Cardiology

## 2017-08-07 NOTE — Telephone Encounter (Signed)
Spoke w/ pt and requested that he send a manual transmission b/c his home monitor has not updated in at least 14 days.   

## 2017-08-12 DIAGNOSIS — G4733 Obstructive sleep apnea (adult) (pediatric): Secondary | ICD-10-CM | POA: Diagnosis not present

## 2017-08-14 ENCOUNTER — Other Ambulatory Visit (HOSPITAL_COMMUNITY): Payer: Self-pay

## 2017-08-14 NOTE — Progress Notes (Signed)
Paramedicine Encounter    Patient ID: [name], male    DOB: [birth year], 61 y.o.   MRN: [id number]    Patient Care Team: [name], MD as PCP - General Himmelrich, Bryson Ha, RD (Inactive) as Dietitian  Patient Active Problem List   Diagnosis Date Noted  . Long term (current) use of anticoagulants 07/04/2017  . Trigger point of thoracic region 07/04/2017  . Cold sore 05/18/2017  . Right lumbar radiculopathy 05/15/2017  . Thoracic back pain 05/09/2017  . Cellulitis of right leg 05/09/2017  . Right leg swelling 05/09/2017  . Right lower quadrant pain 05/09/2017  . Allergic conjunctivitis 04/18/2017  . Ingrown nail 04/18/2017  . Glaucoma 04/18/2017  . History of nasal polyposis 02/19/2017  . Chronic rhinitis 02/19/2017  . Benign localized prostatic hyperplasia with lower urinary tract symptoms (LUTS) 01/17/2017  . Acute on chronic systolic heart failure ([place])   . CAP (community acquired pneumonia) 12/31/2016  . Acute on chronic respiratory failure ([place]) 12/31/2016  . Lobar pneumonia ([place])   . Atrial fibrillation with RVR ([place])   . Microhematuria 12/04/2016  . Encounter for well adult exam with abnormal findings 11/29/2015  . Atrial fibrillation ([place]) 09/06/2015  . Chest pain 09/01/2015  . Rash 05/26/2015  . Cardiomyopathy ([place])   . Type II diabetes mellitus ([place])   . Hyperlipidemia   . Acute on chronic diastolic heart failure ([place]) 03/30/2015  . Persistent atrial fibrillation ([place]) 03/30/2015  . Syncope 03/30/2015  . Anemia 02/23/2015  . Thyrotoxicosis 02/23/2015  . Hx of adenomatous colonic polyps 10/21/2014  . Hemorrhoid 08/19/2014  . De Quervain's tenosynovitis, right 04/30/2014  . Allergic rhinitis 04/25/2014  . Osteoarthritis of right knee 02/19/2014  . Encounter for therapeutic drug monitoring 11/17/2013  . Thoracic degenerative disc disease 06/17/2013  . Thoracic spondylosis without myelopathy 02/11/2013  . Lumbosacral spondylosis without myelopathy  02/11/2013  . COPD (chronic obstructive pulmonary disease) ([place]) 08/20/2012  . Bundle branch block, right and extreme left anterior fascicular 05/05/2012  . Nonischemic cardiomyopathy ([place]) 05/02/2012  . Anticoagulated on warfarin 03/21/2012  . Spongiotic dermatitis 09/26/2011  . Past myocardial infarction 04/19/2011  . UNSPECIFIED URETHRAL STRICTURE 12/26/2010  . Chronic pain syndrome 05/18/2010  . Obstructive sleep apnea 10/21/2007  . NEPHROLITHIASIS, HX OF 10/21/2007  . Anxiety 06/09/2007  . GERD 06/09/2007  . BENIGN PROSTATIC HYPERTROPHY 06/09/2007  . Morbid obesity ([place]) 06/03/2007  . Depression 06/03/2007  . Essential hypertension 06/03/2007  . INSOMNIA 06/03/2007    Current Outpatient Prescriptions:  .  ALPRAZolam (XANAX) 0.5 MG tablet, TAKE 1 TO 2 TABLETS BY MOUTH AT BEDTIME AS NEEDED SLEEP, Disp: 180 tablet, Rfl: 1 .  carisoprodol (SOMA) 350 MG tablet, TAKE 1 TABLET BY MOUTH 3 TIMES A DAY AS NEEDED FOR MUSCLE SPASM, Disp: 90 tablet, Rfl: 5 .  carvedilol (COREG) 6.25 MG tablet, Take 1.5 tablets (9.375 mg total) by mouth 2 (two) times daily with a meal., Disp: 90 tablet, Rfl: 3 .  digoxin (LANOXIN) 0.25 MG tablet, Take 0.5 tablets (0.125 mg total) by mouth daily., Disp: 30 tablet, Rfl: 11 .  potassium chloride SA (K-DUR,KLOR-CON) 20 MEQ tablet, Take 2 tablets (40 mEq total) by mouth daily., Disp: 60 tablet, Rfl: 3 .  predniSONE (DELTASONE) 10 MG tablet, 4tab by mouth x 3day,3tab x 3day,2tab x 3day,1tab x 3 day, Disp: 30 tablet, Rfl: 0 .  torsemide (DEMADEX) 20 MG tablet, Take 60 mg (3 Tablets) in the AM and 40 mg (2 Tablets) in the PM., Disp:  150 tablet, Rfl: 3 .  traMADol (ULTRAM) 50 MG tablet, TAKE 1 TABLET BY MOUTH EVERY 6 HOURS AS NEEDED, Disp: 30 tablet, Rfl: 2 .  traZODone (DESYREL) 100 MG tablet, Take 0.5 tablets (50 mg total) by mouth at bedtime as needed., Disp: 45 tablet, Rfl: 1 .  valACYclovir (VALTREX) 1000 MG tablet, Take 1 tablet (1,000 mg total) by mouth 3 (three)  times daily., Disp: 21 tablet, Rfl: 2 .  venlafaxine XR (EFFEXOR XR) 37.5 MG 24 hr capsule, Take 1 capsule (37.5 mg total) by mouth daily with breakfast., Disp: 30 capsule, Rfl: 1 .  Vitamin D, Ergocalciferol, (DRISDOL) 50000 units CAPS capsule, Take 1 capsule (50,000 Units total) by mouth every 7 (seven) days., Disp: 12 capsule, Rfl: 0 .  acetaminophen (TYLENOL) 500 MG tablet, Take 500 mg by mouth every 6 (six) hours as needed., Disp: , Rfl:  .  albuterol (VENTOLIN HFA) 108 (90 Base) MCG/ACT inhaler, INHALE 2 PUFFS 4 TIMES A DAY as needed, Disp: 54 Inhaler, Rfl: 3 .  aspirin-acetaminophen-caffeine (EXCEDRIN MIGRAINE) 250-250-65 MG tablet, , Disp: , Rfl:  .  fluticasone (FLONASE) 50 MCG/ACT nasal spray, Place 2 sprays into both nostrils daily as needed for allergies or rhinitis., Disp: 18.2 g, Rfl: 5 .  Fluticasone-Salmeterol (ADVAIR DISKUS) 250-50 MCG/DOSE AEPB, Inhale 1 puff into the lungs 2 (two) times daily., Disp: 180 each, Rfl: 3 .  hydrocortisone 2.5 % cream, Apply 1 application topically at bedtime. itching , Disp: , Rfl:  .  ketoconazole (NIZORAL) 2 % cream, Apply 1 application topically daily., Disp: 15 g, Rfl: 0 .  lidocaine (LIDODERM) 5 %, APPLY 1 PATCH TO AFFECTED AREA FOR UP TO 12 HOURS . DO NOT USE MORE THAN ONE PATCH IN 24 HOURS, Disp: 90 patch, Rfl: 1 .  linaclotide (LINZESS) 145 MCG CAPS capsule, Take 1 capsule (145 mcg total) by mouth every other day., Disp: 30 capsule, Rfl: 11 .  lisinopril (PRINIVIL,ZESTRIL) 2.5 MG tablet, Take 1 tablet (2.5 mg total) by mouth daily., Disp: 30 tablet, Rfl: 5 .  mometasone (ELOCON) 0.1 % ointment, Apply sparingly to affected areas daily as needed, Disp: 45 g, Rfl: 3 .  niacin 500 MG tablet, , Disp: , Rfl:  .  omeprazole (PRILOSEC) 20 MG capsule, Take 1 capsule (20 mg total) by mouth daily with supper., Disp: 90 capsule, Rfl: 1 .  sertraline (ZOLOFT) 100 MG tablet, TAKE 2 TABLETS BY MOUTH AT BEDTIME, Disp: 180 tablet, Rfl: 3 .  silver sulfADIAZINE  (SILVADENE) 1 % cream, APPLY TO AFFECTED AREA TOPICALLY EVERY DAY, Disp: 50 g, Rfl: 3 .  spironolactone (ALDACTONE) 25 MG tablet, Take 0.5 tablets (12.5 mg total) by mouth at bedtime., Disp: 15 tablet, Rfl: 3 .  sulfamethoxazole-trimethoprim (BACTRIM DS,SEPTRA DS) 800-160 MG tablet, Take 1 tablet by mouth 2 (two) times daily., Disp: 20 tablet, Rfl: 0 .  triamcinolone cream (KENALOG) 0.1 %, Apply 1 application topically 3 (three) times daily. Apply to nail folds., Disp: 30 g, Rfl: 0 .  TUMS 500 MG chewable tablet, Chew 500-2,000 mg by mouth every 4 (four) hours as needed for indigestion or heartburn. , Disp: , Rfl:  .  warfarin (COUMADIN) 5 MG tablet, Take as directed by anticoagulation clinic, Disp: 30 tablet, Rfl: 3 .  zolpidem (AMBIEN) 10 MG tablet, TAKE 1 TABLET BY MOUTH AT BEDTIME, Disp: 30 tablet, Rfl: 5 Allergies  Allergen Reactions  . Amiodarone Other (See Comments)    hyperthyroidism  . Statins Other (See Comments)  myalgia  . Tape Other (See Comments)    Skin Tears Use Paper Tape Only     Social History   Social History  . Marital status: Divorced    Spouse name: N/A  . Number of children: 2  . Years of education: N/A   Occupational History  . retired/disabled. prev worked in Therapist, sports. Disabled   Social History Main Topics  . Smoking status: Former Smoker    Packs/day: 2.00    Years: 30.00    Types: Cigarettes    Quit date: 10/16/2007  . Smokeless tobacco: Never Used  . Alcohol use No  . Drug use: No  . Sexual activity: Not Currently   Other Topics Concern  . Not on file   Social History Narrative   Lives alone.    Physical Exam  Pulmonary/Chest: No respiratory distress.  Abdominal: He exhibits no distension. There is no tenderness. There is no guarding.  Musculoskeletal: He exhibits no edema.  Skin: Skin is warm and dry. He is not diaphoretic.        Future Appointments Date Time Provider [place]  08/23/2017 3:00 PM [facility]  08/26/2017 1:50 PM [facility] DEVICE REMOTES [facility]  08/30/2017 12:15 PM Deboraha Sprang, MD [facility]  10/18/2017 1:40 PM [name], MD [facility]    ATF pt CAO x4 sitting in the living room waiting for our appointment.  Pt has no complaints today; he denies sob, chest pain and dizziness.  He stated that he stopped taking zoloft about 2 weeks ago.  Pt stated that he has stopped before but he will start back if he "feels the need".  He stated that if he has uncontrollable crying, then that's a sign that he needs to take it again. Pt has taken all of his meds and hasn't missed any.  He is visiting with his friends more now and he has become more active lately.  Pt is continuing to watch what he eats and his fluid intake.  rx bottles verified.  BP 130/76   Pulse (!) 54   Resp 16   Wt 248 lb 14.4 oz (112.9 kg)   SpO2 98%   BMI 35.71 kg/m   Weight yesterday-250.6 Last visit weight-256 (clinic visit)    Gentry Seeber, EMT Paramedic 08/14/2017    ACTION: Home visit completed Next visit planned for next month

## 2017-08-16 ENCOUNTER — Encounter: Payer: Self-pay | Admitting: Internal Medicine

## 2017-08-20 ENCOUNTER — Other Ambulatory Visit: Payer: Self-pay | Admitting: Internal Medicine

## 2017-08-22 ENCOUNTER — Other Ambulatory Visit: Payer: Self-pay | Admitting: General Practice

## 2017-08-22 ENCOUNTER — Other Ambulatory Visit: Payer: Self-pay | Admitting: Internal Medicine

## 2017-08-22 MED ORDER — WARFARIN SODIUM 5 MG PO TABS: ORAL_TABLET | ORAL | 3 refills | Status: DC

## 2017-08-23 ENCOUNTER — Other Ambulatory Visit: Payer: Self-pay | Admitting: Internal Medicine

## 2017-08-23 ENCOUNTER — Ambulatory Visit: Payer: Medicare HMO

## 2017-08-24 ENCOUNTER — Other Ambulatory Visit: Payer: Self-pay | Admitting: Internal Medicine

## 2017-08-24 DIAGNOSIS — I255 Ischemic cardiomyopathy: Secondary | ICD-10-CM

## 2017-08-24 DIAGNOSIS — I428 Other cardiomyopathies: Secondary | ICD-10-CM

## 2017-08-24 DIAGNOSIS — J449 Chronic obstructive pulmonary disease, unspecified: Secondary | ICD-10-CM | POA: Diagnosis not present

## 2017-08-24 DIAGNOSIS — I4819 Other persistent atrial fibrillation: Secondary | ICD-10-CM

## 2017-08-26 ENCOUNTER — Ambulatory Visit (INDEPENDENT_AMBULATORY_CARE_PROVIDER_SITE_OTHER): Payer: Medicare HMO | Admitting: *Deleted

## 2017-08-26 DIAGNOSIS — R55 Syncope and collapse: Secondary | ICD-10-CM | POA: Diagnosis not present

## 2017-08-26 NOTE — Progress Notes (Signed)
Carelink Summary Report / Loop Recorder 

## 2017-08-26 NOTE — Telephone Encounter (Signed)
Done hardcopy to [place]  

## 2017-08-27 NOTE — Telephone Encounter (Signed)
faxed

## 2017-08-28 ENCOUNTER — Other Ambulatory Visit: Payer: Self-pay | Admitting: Family Medicine

## 2017-08-28 ENCOUNTER — Telehealth: Payer: Self-pay | Admitting: *Deleted

## 2017-08-28 NOTE — Telephone Encounter (Signed)
LMOVM regarding sending manual transmission and symptoms associated with 8 second pause episode on 08/23/17 at 1637. [facility] phone number to call back.

## 2017-08-28 NOTE — Telephone Encounter (Signed)
Refill done.  

## 2017-08-29 NOTE — Telephone Encounter (Signed)
Spoke with patient regarding symptoms associated with two 8 second pause episodes, 08/22/17 and 08/23/17. Patient states he could not recall any symptoms. Educated patient on using symptom activator with syncope or presyncope episodes. Patient verbalized understanding.   Reminding patient of follow-up with Dr. [name] 10/23/16.

## 2017-08-30 ENCOUNTER — Encounter: Payer: Medicare HMO | Admitting: Internal Medicine

## 2017-08-31 ENCOUNTER — Other Ambulatory Visit: Payer: Self-pay | Admitting: Internal Medicine

## 2017-09-02 LAB — CUP PACEART REMOTE DEVICE CHECK
Implantable Pulse Generator Implant Date: 20160622
MDC IDC SESS DTM: 20181110133957

## 2017-09-02 NOTE — Telephone Encounter (Signed)
Done erx 

## 2017-09-12 ENCOUNTER — Telehealth: Payer: Self-pay | Admitting: Cardiology

## 2017-09-12 DIAGNOSIS — G4733 Obstructive sleep apnea (adult) (pediatric): Secondary | ICD-10-CM | POA: Diagnosis not present

## 2017-09-12 NOTE — Telephone Encounter (Signed)
Spoke w/ pt and requested that he send a manual transmission b/c his home monitor has not updated in at least 14 days.   

## 2017-09-17 ENCOUNTER — Encounter: Payer: Self-pay | Admitting: Internal Medicine

## 2017-09-17 ENCOUNTER — Ambulatory Visit: Payer: Medicare HMO | Admitting: Internal Medicine

## 2017-09-17 ENCOUNTER — Ambulatory Visit: Payer: Medicare HMO | Admitting: General Practice

## 2017-09-17 VITALS — BP 108/68 | HR 62 | Temp 98.2°F | Resp 16 | Ht 70.0 in | Wt 254.0 lb

## 2017-09-17 VITALS — BP 102/68 | HR 70 | Ht 70.0 in | Wt 253.4 lb

## 2017-09-17 DIAGNOSIS — I5022 Chronic systolic (congestive) heart failure: Secondary | ICD-10-CM

## 2017-09-17 DIAGNOSIS — R55 Syncope and collapse: Secondary | ICD-10-CM | POA: Diagnosis not present

## 2017-09-17 DIAGNOSIS — I481 Persistent atrial fibrillation: Secondary | ICD-10-CM

## 2017-09-17 DIAGNOSIS — L308 Other specified dermatitis: Secondary | ICD-10-CM | POA: Diagnosis not present

## 2017-09-17 DIAGNOSIS — Z01812 Encounter for preprocedural laboratory examination: Secondary | ICD-10-CM | POA: Diagnosis not present

## 2017-09-17 DIAGNOSIS — I442 Atrioventricular block, complete: Secondary | ICD-10-CM

## 2017-09-17 DIAGNOSIS — Z7901 Long term (current) use of anticoagulants: Secondary | ICD-10-CM

## 2017-09-17 DIAGNOSIS — I4510 Unspecified right bundle-branch block: Secondary | ICD-10-CM | POA: Diagnosis not present

## 2017-09-17 DIAGNOSIS — I38 Endocarditis, valve unspecified: Secondary | ICD-10-CM

## 2017-09-17 DIAGNOSIS — I4819 Other persistent atrial fibrillation: Secondary | ICD-10-CM

## 2017-09-17 DIAGNOSIS — I5030 Unspecified diastolic (congestive) heart failure: Secondary | ICD-10-CM

## 2017-09-17 LAB — BASIC METABOLIC PANEL
BUN / CREAT RATIO: 29 — AB (ref 10–24)
BUN: 24 mg/dL (ref 8–27)
CHLORIDE: 102 mmol/L (ref 96–106)
CO2: 28 mmol/L (ref 20–29)
Calcium: 9.3 mg/dL (ref 8.6–10.2)
Creatinine, Ser: 0.84 mg/dL (ref 0.76–1.27)
GFR, EST AFRICAN AMERICAN: 108 mL/min/{1.73_m2} (ref >59)
GFR, EST NON AFRICAN AMERICAN: 94 mL/min/{1.73_m2} (ref >59)
Glucose: 95 mg/dL (ref 65–99)
POTASSIUM: 4.2 mmol/L (ref 3.5–5.2)
Sodium: 139 mmol/L (ref 134–144)

## 2017-09-17 LAB — POCT INR: INR: 1.3

## 2017-09-17 LAB — CBC WITH DIFFERENTIAL/PLATELET
BASOS ABS: 0 10*3/uL (ref 0.0–0.2)
Basos: 0 %
EOS (ABSOLUTE): 0.3 10*3/uL (ref 0.0–0.4)
Eos: 3 %
HEMATOCRIT: 38.7 % (ref 37.5–51.0)
Hemoglobin: 13.2 g/dL (ref 13.0–17.7)
LYMPHS: 20 %
Lymphocytes Absolute: 1.8 10*3/uL (ref 0.7–3.1)
MCH: 33.2 pg — ABNORMAL HIGH (ref 26.6–33.0)
MCHC: 34.1 g/dL (ref 31.5–35.7)
MCV: 98 fL — ABNORMAL HIGH (ref 79–97)
MONOS ABS: 1 10*3/uL — AB (ref 0.1–0.9)
Monocytes: 11 %
NEUTROS ABS: 5.8 10*3/uL (ref 1.4–7.0)
Neutrophils: 66 %
PLATELETS: 197 10*3/uL (ref 150–379)
RBC: 3.97 x10E6/uL — ABNORMAL LOW (ref 4.14–5.80)
RDW: 12.4 % (ref 12.3–15.4)
WBC: 8.8 10*3/uL (ref 3.4–10.8)

## 2017-09-17 MED ORDER — METHYLPREDNISOLONE ACETATE 80 MG/ML IJ SUSP
80.0000 mg | Freq: Once | INTRAMUSCULAR | Status: AC
  Administered 2017-09-17: 80 mg via INTRAMUSCULAR

## 2017-09-17 MED ORDER — CLOBETASOL PROPIONATE 0.05 % EX OINT: 1.0000 "application " | TOPICAL_OINTMENT | Freq: Two times a day (BID) | CUTANEOUS | 1 refills | Status: DC

## 2017-09-17 MED ORDER — DOXEPIN HCL 10 MG PO CAPS: 20.0000 mg | ORAL_CAPSULE | Freq: Every day | ORAL | 0 refills | Status: DC

## 2017-09-17 NOTE — Patient Instructions (Addendum)
Medication Instructions: Your physician recommends that you continue on your current medications as directed. Please refer to the Current Medication list given to you today.  Labwork: Your physician has recommended that you have lab work today: BMET, CBC,   Procedures/Testing: Your physician has recommended that you have a defibrillator inserted. An implantable cardioverter defibrillator (ICD) is a small device that is placed in your chest or, in rare cases, your abdomen. This device uses electrical pulses or shocks to help control life-threatening, irregular heartbeats that could lead the heart to suddenly stop beating (sudden cardiac arrest). Leads are attached to the ICD that goes into your heart. This is done in the hospital and usually requires an overnight stay. Please see the instruction sheet given to you today for more information.   Follow-Up: Your physician recommends that you schedule a follow-up appointment in: 10-14 days with the [facility] for a wound Check  Your physician recommends that you schedule a follow-up appointment in: 3 MONTHS with Dr. [name].   If you need a refill on your cardiac medications before your next appointment, please call your pharmacy.

## 2017-09-17 NOTE — Progress Notes (Signed)
Subjective:  Patient ID: [name], male    DOB: August 07, 1956  Age: 61 y.o. MRN: [id number]  CC: Rash  NEW TO ME  HPI [name] presents for a 4-year history of intensely pruritic rash.  Patient and his notes indicate that he was seen by dermatologist at [facility] and had a biopsy that showed spongiotic dermatitis.  He is currently treating it with a low potency topical steroid and is not getting symptom relief.  He complains of itchy, red areas across his torso and back.  The itching is intense and keeps him awake at night.  On reviewing, he is not aware of any medications that were started 4 years ago that could have triggered this.  He does not think the recent initiation of an ACE inhibitor is associated with this.  No facility-administered medications prior to visit.    Outpatient Medications Prior to Visit  Medication Sig Dispense Refill  . acetaminophen (TYLENOL) 500 MG tablet Take 500 mg by mouth every 6 (six) hours as needed.    [name] albuterol (VENTOLIN HFA) 108 (90 Base) MCG/ACT inhaler INHALE 2 PUFFS 4 TIMES A DAY as needed (Patient taking differently: Inhale 2 puffs into the lungs every 6 (six) hours as needed for wheezing or shortness of breath. INHALE 2 PUFFS 4 TIMES A DAY as needed) 54 Inhaler 3  . ALPRAZolam (XANAX) 0.5 MG tablet TAKE 1 TO 2 TABLETS BY MOUTH EVERY DAY AT BEDTIME AS NEEDED FOR SLEEP (Patient taking differently: TAKE 1mg   BY MOUTH EVERY DAY AT BEDTIME) 180 tablet 1  . carisoprodol (SOMA) 350 MG tablet TAKE 1 TABLET BY MOUTH 3 TIMES A DAY AS NEEDED FOR MUSCLE SPASM (Patient taking differently: TAKE 350 mg BY MOUTH 3 TIMES A DAY AS NEEDED FOR MUSCLE SPASM) 90 tablet 5  . carvedilol (COREG) 6.25 MG tablet Take 1.5 tablets (9.375 mg total) by mouth 2 (two) times daily with a meal. 90 tablet 3  . digoxin (LANOXIN) 0.25 MG tablet Take 0.5 tablets (0.125 mg total) by mouth daily. 30 tablet 11  . fluticasone (FLONASE) 50 MCG/ACT nasal spray Place 2 sprays  into both nostrils daily as needed for allergies or rhinitis. 18.2 g 5  . Fluticasone-Salmeterol (ADVAIR DISKUS) 250-50 MCG/DOSE AEPB Inhale 1 puff into the lungs 2 (two) times daily. (Patient taking differently: Inhale 1 puff into the lungs 2 (two) times daily as needed (shortness of breath). ) 180 each 3  . linaclotide (LINZESS) 145 MCG CAPS capsule Take 1 capsule (145 mcg total) by mouth every other day. 30 capsule 11  . omeprazole (PRILOSEC) 20 MG capsule Take 1 capsule (20 mg total) by mouth daily with supper. 90 capsule 1  . potassium chloride SA (K-DUR,KLOR-CON) 20 MEQ tablet Take 2 tablets (40 mEq total) by mouth daily. 60 tablet 3  . sertraline (ZOLOFT) 100 MG tablet TAKE 2 TABLETS BY MOUTH AT BEDTIME (Patient not taking: Reported on 09/18/2017) 180 tablet 3  . spironolactone (ALDACTONE) 25 MG tablet Take 0.5 tablets (12.5 mg total) by mouth at bedtime. 15 tablet 3  . torsemide (DEMADEX) 20 MG tablet Take 60 mg (3 Tablets) in the AM and 40 mg (2 Tablets) in the PM. 150 tablet 3  . traMADol (ULTRAM) 50 MG tablet TAKE 1 TABLET BY MOUTH EVERY 6 HOURS AS NEEDED (Patient taking differently: TAKE 50 mg  BY MOUTH EVERY 6 HOURS AS NEEDED for pain) 30 tablet 2  . traZODone (DESYREL) 100 MG tablet Take 0.5  tablets (50 mg total) by mouth at bedtime as needed. 45 tablet 1  . TUMS 500 MG chewable tablet Chew 500-2,000 mg by mouth every 4 (four) hours as needed for indigestion or heartburn.     . valACYclovir (VALTREX) 1000 MG tablet Take 1 tablet (1,000 mg total) by mouth 3 (three) times daily. (Patient taking differently: Take 1,000 mg by mouth 3 (three) times daily as needed (cold sores). ) 21 tablet 2  . venlafaxine XR (EFFEXOR-XR) 37.5 MG 24 hr capsule TAKE ONE CAPSULE BY MOUTH EVERY DAY WITH BREAKFAST 90 capsule 1  . Vitamin D, Ergocalciferol, (DRISDOL) 50000 units CAPS capsule Take 1 capsule (50,000 Units total) by mouth every 7 (seven) days. 12 capsule 0  . warfarin (COUMADIN) 5 MG tablet Take as  directed by anticoagulation clinic (Patient taking differently: Take 2.5-5 mg by mouth See admin instructions. Take 2.5 mg on Mon / Fri  Take 5 mg on Sun / Tues / Wed/ Thurs/ Sat) 30 tablet 3  . zolpidem (AMBIEN) 10 MG tablet TAKE 1 TABLET BY MOUTH AT BEDTIME 30 tablet 5  . hydrocortisone 2.5 % cream Apply 1 application topically at bedtime. itching     . ketoconazole (NIZORAL) 2 % cream Apply 1 application topically daily. 15 g 0  . silver sulfADIAZINE (SILVADENE) 1 % cream APPLY TO AFFECTED AREA TOPICALLY EVERY DAY 50 g 3  . lisinopril (PRINIVIL,ZESTRIL) 2.5 MG tablet Take 1 tablet (2.5 mg total) by mouth daily. 30 tablet 5  . aspirin-acetaminophen-caffeine (EXCEDRIN MIGRAINE) 540-086-76 MG tablet     . hydrocortisone 2.5 % cream APPLY TWICE A DAY 28.35 g 5  . lidocaine (LIDODERM) 5 % APPLY 1 PATCH TO AFFECTED AREA FOR UP TO 12 HOURS . DO NOT USE MORE THAN ONE PATCH IN 24 HOURS 90 patch 1  . mometasone (ELOCON) 0.1 % ointment Apply sparingly to affected areas daily as needed 45 g 3  . niacin 500 MG tablet     . predniSONE (DELTASONE) 10 MG tablet 4tab by mouth x 3day,3tab x 3day,2tab x 3day,1tab x 3 day 30 tablet 0  . sulfamethoxazole-trimethoprim (BACTRIM DS,SEPTRA DS) 800-160 MG tablet Take 1 tablet by mouth 2 (two) times daily. 20 tablet 0  . triamcinolone cream (KENALOG) 0.1 % Apply 1 application topically 3 (three) times daily. Apply to nail folds. 30 g 0    ROS Review of Systems  Constitutional: Negative.  Negative for diaphoresis, fatigue and unexpected weight change.  HENT: Negative.  Negative for facial swelling, sinus pressure and trouble swallowing.   Eyes: Negative.   Respiratory: Negative.  Negative for cough, chest tightness, shortness of breath and wheezing.   Cardiovascular: Negative for leg swelling.  Gastrointestinal: Negative.  Negative for diarrhea, nausea and vomiting.  Endocrine: Negative.   Genitourinary: Negative.  Negative for dysuria.  Musculoskeletal: Negative.   Negative for back pain and myalgias.  Skin: Positive for color change and rash. Negative for pallor and wound.  Allergic/Immunologic: Negative.   Neurological: Negative.  Negative for dizziness.  Hematological: Negative for adenopathy. Does not bruise/bleed easily.  Psychiatric/Behavioral: Negative.     Objective:  BP 108/68 (BP Location: Left Arm, Patient Position: Sitting, Cuff Size: Large)   Pulse 62   Temp 98.2 F (36.8 C) (Oral)   Resp 16   Ht 5\' 10"  (1.778 m)   Wt 254 lb (115.2 kg)   SpO2 100%   BMI 36.45 kg/m   BP Readings from Last 3 Encounters:  09/19/17 108/69  09/17/17 102/68  09/17/17 108/68    Wt Readings from Last 3 Encounters:  09/19/17 251 lb 5.2 oz (114 kg)  09/17/17 253 lb 6.4 oz (114.9 kg)  09/17/17 254 lb (115.2 kg)    Physical Exam  Constitutional: He is oriented to person, place, and time. No distress.  HENT:  Mouth/Throat: Oropharynx is clear and moist. No oropharyngeal exudate.  Eyes: Conjunctivae are normal. Left eye exhibits no discharge. No scleral icterus.  Neck: Normal range of motion. Neck supple. No JVD present. No thyromegaly present.  Cardiovascular: Normal rate and regular rhythm.  No murmur heard. Pulmonary/Chest: Effort normal and breath sounds normal. No respiratory distress. He has no wheezes. He has no rales.  Abdominal: Soft. Bowel sounds are normal. He exhibits no distension. There is no tenderness. There is no guarding.  Musculoskeletal: Normal range of motion. He exhibits no edema or tenderness.  Neurological: He is alert and oriented to person, place, and time.  Skin: Skin is warm and dry. Rash noted. He is not diaphoretic. There is erythema. No pallor.  There are round, scattered, erythematous plaques that blanch minimally across the chest, torso, and abdomen.  They measure about 2-3 cm each. There is no central clearing.  A few of them are excoriated and have tiny scabs.  The surface is rough but there is no scale,  induration, vesicles, or fluctuance.  Vitals reviewed.   Lab Results  Component Value Date   WBC 8.8 09/17/2017   HGB 13.2 09/17/2017   HCT 38.7 09/17/2017   PLT 197 09/17/2017   GLUCOSE 95 09/17/2017   CHOL 159 04/18/2017   TRIG 104.0 04/18/2017   HDL 41.80 04/18/2017   LDLDIRECT 111.0 05/30/2016   LDLCALC 96 04/18/2017   ALT 13 05/09/2017   AST 16 05/09/2017   NA 139 09/17/2017   K 4.2 09/17/2017   CL 102 09/17/2017   CREATININE 0.84 09/17/2017   BUN 24 09/17/2017   CO2 28 09/17/2017   TSH 3.28 04/18/2017   PSA 3.29 04/18/2017   INR 1.61 09/19/2017   HGBA1C 5.8 04/18/2017   MICROALBUR 3.0 (H) 04/18/2017    No results found.  Assessment & Plan:   Eilam was seen today for rash.  Diagnoses and all orders for this visit:  Spongiotic dermatitis- He is having a mild flare and his symptoms are intense.  Will treat with an injection of methylprednisolone.  Will upgrade him to a more potent topical steroid.  Will offer a potent antihistamine at bedtime to control the itching.  I have encouraged him to follow-up with his dermatologist to [facility].  I have also asked him to try to identify any medications that could have triggered this 4 years ago or any medications in the last year that could have exacerbated this. -     doxepin (SINEQUAN) 10 MG capsule; Take 2 capsules (20 mg total) by mouth at bedtime. -     clobetasol ointment (TEMOVATE) 0.05 %; Apply 1 application topically 2 (two) times daily. -     methylPREDNISolone acetate (DEPO-MEDROL) injection 80 mg   I have discontinued Darvis L. Nale "Lee"'s hydrocortisone, silver sulfADIAZINE, aspirin-acetaminophen-caffeine, niacin, mometasone, triamcinolone cream, ketoconazole, sulfamethoxazole-trimethoprim, lidocaine, predniSONE, and hydrocortisone. I am also having him start on doxepin and clobetasol ointment. Additionally, I am having him maintain his omeprazole, TUMS, fluticasone, sertraline, lisinopril, zolpidem,  carisoprodol, linaclotide, traZODone, acetaminophen, albuterol, Fluticasone-Salmeterol, valACYclovir, carvedilol, torsemide, Vitamin D (Ergocalciferol), digoxin, spironolactone, potassium chloride SA, warfarin, ALPRAZolam, venlafaxine XR, and traMADol. We administered methylPREDNISolone acetate.  Meds ordered this encounter  Medications  . doxepin (SINEQUAN) 10 MG capsule    Sig: Take 2 capsules (20 mg total) by mouth at bedtime.    Dispense:  180 capsule    Refill:  0  . clobetasol ointment (TEMOVATE) 0.05 %    Sig: Apply 1 application topically 2 (two) times daily.    Dispense:  60 g    Refill:  1  . methylPREDNISolone acetate (DEPO-MEDROL) injection 80 mg     Follow-up: Return in about 3 weeks (around 10/08/2017).  Scarlette Calico, MD

## 2017-09-17 NOTE — Patient Instructions (Addendum)
Pre visit review using our clinic review tool, if applicable. No additional management support is needed unless otherwise documented below in the visit note.  Take 1 1/2 tablets today (12/4) and tomorrow (12/5) and then take 1 tablet all days except 1/2 tablet on Mondays and Fridays.  Re-check in 1 week.

## 2017-09-17 NOTE — Anesthesia Preprocedure Evaluation (Addendum)
Anesthesia Evaluation  Patient identified by MRN, date of birth, ID band Patient awake    Reviewed: Allergy & Precautions, NPO status , Patient's Chart, lab work & pertinent test results, reviewed documented beta blocker date and time   Airway Mallampati: II  TM Distance: >3 FB Neck ROM: Full    Dental  (+) Teeth Intact, Dental Advisory Given, Chipped,    Pulmonary asthma , sleep apnea and Oxygen sleep apnea , COPD,  oxygen dependent, former smoker,    breath sounds clear to auscultation       Cardiovascular hypertension, Pt. on medications and Pt. on home beta blockers + Past MI and +CHF  + dysrhythmias Atrial Fibrillation + Valvular Problems/Murmurs MR  Rhythm:Irregular Rate:Normal  Cath 01/2017 - Mildly elevated LV and RV filling pressures though LVEDP is not elevated. Preserved cardiac output.  Nonobstructive CAD => nonischemic cardiomyopathy  TTE 12/2016 - LV was severely dilated. There was   mild concentric hypertrophy. EF was in the range of   10-15%. Diffuse hypokinesis. Trivial AI, mild MR, severely dilated LA. Moderately dilated RV with severely reduced systolic function. RA was severely dilated. Moderate TR. PASP was moderately increased at 45 mmHg.   Neuro/Psych Anxiety Depression negative neurological ROS     GI/Hepatic Neg liver ROS, GERD  Medicated and Controlled,Esophageal dyskinesia   Endo/Other  diabetes, Well Controlled, Type 2, Oral Hypoglycemic AgentsMorbid obesityObesity  Renal/GU negative Renal ROS  negative genitourinary   Musculoskeletal  (+) Arthritis , Fibromyalgia -  Abdominal   Peds  Hematology negative hematology ROS (+)   Anesthesia Other Findings   Reproductive/Obstetrics                            Anesthesia Physical  Anesthesia Plan  ASA: IV  Anesthesia Plan: MAC   Post-op Pain Management:    Induction: Intravenous  PONV Risk Score and Plan: Propofol  infusion and Treatment may vary due to age or medical condition  Airway Management Planned: Simple Face Mask and Natural Airway  Additional Equipment: None  Intra-op Plan:   Post-operative Plan:   Informed Consent: I have reviewed the patients History and Physical, chart, labs and discussed the procedure including the risks, benefits and alternatives for the proposed anesthesia with the patient or authorized representative who has indicated his/her understanding and acceptance.   Dental advisory given  Plan Discussed with: CRNA  Anesthesia Plan Comments:         Anesthesia Quick Evaluation

## 2017-09-17 NOTE — Patient Instructions (Signed)
Eczema Eczema, also called atopic dermatitis, is a skin disorder that causes inflammation of the skin. It causes a red rash and dry, scaly skin. The skin becomes very itchy. Eczema is generally worse during the cooler winter months and often improves with the warmth of summer. Eczema usually starts showing signs in infancy. Some children outgrow eczema, but it may last through adulthood. What are the causes? The exact cause of eczema is not known, but it appears to run in families. People with eczema often have a family history of eczema, allergies, asthma, or hay fever. Eczema is not contagious. Flare-ups of the condition may be caused by:  Contact with something you are sensitive or allergic to.  Stress.  What are the signs or symptoms?  Dry, scaly skin.  Red, itchy rash.  Itchiness. This may occur before the skin rash and may be very intense. How is this diagnosed? The diagnosis of eczema is usually made based on symptoms and medical history. How is this treated? Eczema cannot be cured, but symptoms usually can be controlled with treatment and other strategies. A treatment plan might include:  Controlling the itching and scratching. ? Use over-the-counter antihistamines as directed for itching. This is especially useful at night when the itching tends to be worse. ? Use over-the-counter steroid creams as directed for itching. ? Avoid scratching. Scratching makes the rash and itching worse. It may also result in a skin infection (impetigo) due to a break in the skin caused by scratching.  Keeping the skin well moisturized with creams every day. This will seal in moisture and help prevent dryness. Lotions that contain alcohol and water should be avoided because they can dry the skin.  Limiting exposure to things that you are sensitive or allergic to (allergens).  Recognizing situations that cause stress.  Developing a plan to manage stress.  Follow these instructions at  home:  Only take over-the-counter or prescription medicines as directed by your health care provider.  Do not use anything on the skin without checking with your health care provider.  Keep baths or showers short (5 minutes) in warm (not hot) water. Use mild cleansers for bathing. These should be unscented. You may add nonperfumed bath oil to the bath water. It is best to avoid soap and bubble bath.  Immediately after a bath or shower, when the skin is still damp, apply a moisturizing ointment to the entire body. This ointment should be a petroleum ointment. This will seal in moisture and help prevent dryness. The thicker the ointment, the better. These should be unscented.  Keep fingernails cut short. Children with eczema may need to wear soft gloves or mittens at night after applying an ointment.  Dress in clothes made of cotton or cotton blends. Dress lightly, because heat increases itching.  A child with eczema should stay away from anyone with fever blisters or cold sores. The virus that causes fever blisters (herpes simplex) can cause a serious skin infection in children with eczema. Contact a health care provider if:  Your itching interferes with sleep.  Your rash gets worse or is not better within 1 week after starting treatment.  You see pus or soft yellow scabs in the rash area.  You have a fever.  You have a rash flare-up after contact with someone who has fever blisters. This information is not intended to replace advice given to you by your health care provider. Make sure you discuss any questions you have with your health care provider.   Document Released: 09/28/2000 Document Revised: 03/08/2016 Document Reviewed: 05/04/2013 Elsevier Interactive Patient Education  2017 [employer] Inc[employer]  

## 2017-09-17 NOTE — H&P (View-Only) (Signed)
He was      Patient Care Team: [name], MD as PCP - General Himmelrich, Bryson Ha, RD (Inactive) as Dietitian   HPI  [name] is a 61 y.o. male seen in followup for atrial fibrillation for which he is treated with amiodarone.  There was some concern regarding dyspnea so he underwent pulmonary function testing by Dr. Gwenette Greet;  he also has a history of syncope and is status post ILR insertion 6/16  He continues to struggle with shortness of breath. He also significant fatigue. He has sleep apnea but does not use CPAP. He does use oxygen.    Echocardiogram 7/13 demonstrated EF 20-25% with severe biatrial enlargement. Repeat echo 8/14 and demonstrated an intercurrent normalization of LV function with biatrial enlargement and mild RV dilatation    When he was last seen 9/18 by me and by Dr. Wallis Bamberg 10/18 the discussion was to proceed with CRT-D given his right bundle branch block with a QRS duration of 180 m    He had recurrent syncope 09/09/17 ILR >> pause > 6 sec   Functional status remains impaired with DOE < 100 ft; edema    DATE TEST     /2012 myoview  EF44 Prior IMI perfsion defect  /2015 myoview   EF57% Perfusion defect--MI vs bowel  2016 myoview 35-40 NO perfusion defect  6/16 Echo 40-45 Severe BAE  6/17 Cath 30 Global Hypokinesis With normal CA And normal pulm   3/18 Echo EF20-25%   4/18 LHC/RHC  No CAD   Antiarrhythmics Date  dofetilide 2013  amidoarone 2016   Date Cr K Hgb  8/18 1.22 4.5 12.2                   Past Medical History:  Diagnosis Date  . Anemia    supposed to be taking Vit B but doesn't  . ANXIETY    takes Xanax nightly  . Arthritis   . ASTHMA    Albuterol prn and Advair daily;also takes Prednisone daily  . Asthma   . Atrial fibrillation ([place]) 09/06/2015  . Cardiomyopathy [facility])    a. EF 25% TEE July 2013; b. EF normalized 2015;  c. 03/2015 Echo: EF 40-45%, difrf HK, PASP 38 mmHg, Mild MR, sev LAE/RAE.  [name] Chronic constipation    takes OTC stool softener  . COPD (chronic obstructive pulmonary disease) ([place])   . DEPRESSION    takes Zoloft and Doxepin daily  . Diverticulitis   . DYSKINESIA, ESOPHAGUS   . Essential hypertension       . FIBROMYALGIA   . GERD (gastroesophageal reflux disease)       . Glaucoma   . HYPERLIPIDEMIA    a. Intolerant to statins.  . INSOMNIA    takes Ambien nightly  . Myocardial infarction [facility])    a. 2012 Myoview notable for prior infarct;  b. 03/2015 Lexiscan CL: EF 37%, diff HK, small area of inferior infarct from apex to base-->Med Rx.  . Myocardial infarction ([place])   . O2 dependent    uses oxygen 2 and 1/2 liters per [place] at hs  . Paroxysmal atrial fibrillation ([facility])    a. CHA2DS2VASc = 3--> takes Coumadin;  b. 03/15/2015 Successful TEE/DCCV;  c. 03/2015 recurrent afib, Amio d/c'd in setting of hyperthyroidism.  . Peripheral neuropathy   . Pneumonia 12/2016  . Rash and other nonspecific skin eruption 04/12/2009   no cause found saw dermatologists x 2 and allergist  . SLEEP APNEA, OBSTRUCTIVE  a. doesn't use CPAP  . Syncope    a. 03/2015 s/p MDT LINQ.  [name] Type II diabetes mellitus ([place])         Past Surgical History:  Procedure Laterality Date  . ACNE CYST REMOVAL     2 on back   . ADENOIDECTOMY    . CARDIAC CATHETERIZATION N/A 03/21/2016   Procedure: Right/Left Heart Cath and Coronary Angiography;  Surgeon: Larey Dresser, MD;  Location: [place] CV LAB;  Service: Cardiovascular;  Laterality: N/A;  . CARDIOVERSION  04/18/2012   Procedure: CARDIOVERSION;  Surgeon: Fay Records, MD;  Location: [place];  Service: Cardiovascular;  Laterality: N/A;  . CARDIOVERSION  04/25/2012   Procedure: CARDIOVERSION;  Surgeon: Thayer Headings, MD;  Location: [place];  Service: Cardiovascular;  Laterality: N/A;  . CARDIOVERSION  04/25/2012   Procedure: CARDIOVERSION;  Surgeon: Fay Records, MD;  Location: [place];  Service: Cardiovascular;  Laterality: N/A;  . CARDIOVERSION  05/09/2012    Procedure: CARDIOVERSION;  Surgeon: Sherren Mocha, MD;  Location: [place];  Service: Cardiovascular;  Laterality: N/A;  changed from crenshaw to cooper by trish/leone-endo  . CARDIOVERSION N/A 03/15/2015   Procedure: CARDIOVERSION;  Surgeon: Thayer Headings, MD;  Location: [facility] ENDOSCOPY;  Service: Cardiovascular;  Laterality: N/A;  . COLONOSCOPY    . COLONOSCOPY WITH PROPOFOL N/A 10/21/2014   Procedure: COLONOSCOPY WITH PROPOFOL;  Surgeon: Ladene Artist, MD;  Location: [facility] ENDOSCOPY;  Service: Endoscopy;  Laterality: N/A;  . EP IMPLANTABLE DEVICE N/A 04/06/2015   Procedure: Loop Recorder Insertion;  Surgeon: Evans Lance, MD;  Location: [place] CV LAB;  Service: Cardiovascular;  Laterality: N/A;  . ESOPHAGOGASTRODUODENOSCOPY    . RIGHT/LEFT HEART CATH AND CORONARY ANGIOGRAPHY N/A 01/28/2017   Procedure: Right/Left Heart Cath and Coronary Angiography;  Surgeon: Larey Dresser, MD;  Location: [place] [facility];  Service: Cardiovascular;  Laterality: N/A;  . TEE WITHOUT CARDIOVERSION  04/25/2012   Procedure: TRANSESOPHAGEAL ECHOCARDIOGRAM (TEE);  Surgeon: Thayer Headings, MD;  Location: [place];  Service: Cardiovascular;  Laterality: N/A;  . TEE WITHOUT CARDIOVERSION N/A 03/15/2015   Procedure: TRANSESOPHAGEAL ECHOCARDIOGRAM (TEE);  Surgeon: Thayer Headings, MD;  Location: [place];  Service: Cardiovascular;  Laterality: N/A;  . TONSILLECTOMY    . TOTAL KNEE ARTHROPLASTY Right 06/15/2014   Procedure: TOTAL KNEE ARTHROPLASTY;  Surgeon: Renette Butters, MD;  Location: [place];  Service: Orthopedics;  Laterality: Right;    Current Outpatient Medications  Medication Sig Dispense Refill  . acetaminophen (TYLENOL) 500 MG tablet Take 500 mg by mouth every 6 (six) hours as needed.    [name] albuterol (VENTOLIN HFA) 108 (90 Base) MCG/ACT inhaler INHALE 2 PUFFS 4 TIMES A DAY as needed 54 Inhaler 3  . ALPRAZolam (XANAX) 0.5 MG tablet TAKE 1 TO 2 TABLETS BY MOUTH EVERY DAY AT BEDTIME AS NEEDED FOR SLEEP  180 tablet 1  . carisoprodol (SOMA) 350 MG tablet TAKE 1 TABLET BY MOUTH 3 TIMES A DAY AS NEEDED FOR MUSCLE SPASM 90 tablet 5  . carvedilol (COREG) 6.25 MG tablet Take 1.5 tablets (9.375 mg total) by mouth 2 (two) times daily with a meal. 90 tablet 3  . clobetasol ointment (TEMOVATE) 4.25 % Apply 1 application topically 2 (two) times daily. 60 g 1  . digoxin (LANOXIN) 0.25 MG tablet Take 0.5 tablets (0.125 mg total) by mouth daily. 30 tablet 11  . doxepin (SINEQUAN) 10 MG capsule Take 2 capsules (20 mg total) by mouth  at bedtime. 180 capsule 0  . fluticasone (FLONASE) 50 MCG/ACT nasal spray Place 2 sprays into both nostrils daily as needed for allergies or rhinitis. 18.2 g 5  . Fluticasone-Salmeterol (ADVAIR DISKUS) 250-50 MCG/DOSE AEPB Inhale 1 puff into the lungs 2 (two) times daily. 180 each 3  . linaclotide (LINZESS) 145 MCG CAPS capsule Take 1 capsule (145 mcg total) by mouth every other day. 30 capsule 11  . omeprazole (PRILOSEC) 20 MG capsule Take 1 capsule (20 mg total) by mouth daily with supper. 90 capsule 1  . potassium chloride SA (K-DUR,KLOR-CON) 20 MEQ tablet Take 2 tablets (40 mEq total) by mouth daily. 60 tablet 3  . sertraline (ZOLOFT) 100 MG tablet TAKE 2 TABLETS BY MOUTH AT BEDTIME 180 tablet 3  . spironolactone (ALDACTONE) 25 MG tablet Take 0.5 tablets (12.5 mg total) by mouth at bedtime. 15 tablet 3  . torsemide (DEMADEX) 20 MG tablet Take 60 mg (3 Tablets) in the AM and 40 mg (2 Tablets) in the PM. 150 tablet 3  . traMADol (ULTRAM) 50 MG tablet TAKE 1 TABLET BY MOUTH EVERY 6 HOURS AS NEEDED 30 tablet 2  . traZODone (DESYREL) 100 MG tablet Take 0.5 tablets (50 mg total) by mouth at bedtime as needed. 45 tablet 1  . TUMS 500 MG chewable tablet Chew 500-2,000 mg by mouth every 4 (four) hours as needed for indigestion or heartburn.     . valACYclovir (VALTREX) 1000 MG tablet Take 1 tablet (1,000 mg total) by mouth 3 (three) times daily. 21 tablet 2  . venlafaxine XR (EFFEXOR-XR)  37.5 MG 24 hr capsule TAKE ONE CAPSULE BY MOUTH EVERY DAY WITH BREAKFAST 90 capsule 1  . Vitamin D, Ergocalciferol, (DRISDOL) 50000 units CAPS capsule Take 1 capsule (50,000 Units total) by mouth every 7 (seven) days. 12 capsule 0  . warfarin (COUMADIN) 5 MG tablet Take as directed by anticoagulation clinic 30 tablet 3  . zolpidem (AMBIEN) 10 MG tablet TAKE 1 TABLET BY MOUTH AT BEDTIME 30 tablet 5  . lisinopril (PRINIVIL,ZESTRIL) 2.5 MG tablet Take 1 tablet (2.5 mg total) by mouth daily. 30 tablet 5   No current facility-administered medications for this visit.     Allergies  Allergen Reactions  . Amiodarone Other (See Comments)    hyperthyroidism  . Statins Other (See Comments)    myalgia  . Tape Other (See Comments)    Skin Tears Use Paper Tape Only    Review of Systems negative except from HPI and PMH  Physical Exam BP 102/68   Pulse 70   Ht 5\' 10"  (1.778 m)   Wt 253 lb 6.4 oz (114.9 kg)   BMI 36.36 kg/m  Well developed and nourished in no acute distress HENT normal Neck supple with JVP-flat Carotids brisk and full without bruits Clear Regular rate and rhythm, no murmurs or gallops Abd-soft with active BS without hepatomegaly No Clubbing cyanosis 1+edema Skin-warm and dry A & Oriented  Grossly normal sensory and motor function    ECG  afib 70 -/19/42 Axis left -84  -/19/44  Assessment and  Plan  Ischemic heart disease without significant obstructive disease by catheterization 6/17  HFrEF    Cardiomyopathy-ischemic/nonischemic  Atrial fibrillation  permanent  RBBB/LAD  Bradycardia  Syncope    Atrial fibrillation is permanent.   Cardiomyopathy continues to gradually worsen   Syncope now associated with documented complete heart block in the context of his atrial fibrillation and underlying bifascicular block.  We will need to undertake  device implantation.  Given the prior discussions, we will proceed with CRT.  This may help with his heart  failure and given his young age, we will undertake ICD implantation.  The patient's chart has been reviewed and they meet criteria for ICD implant.  I have had a thorough discussion with the patient reviewing options.  The patient and their family (if available) have had opportunities to ask questions and have them answered. The patient and I have decided together through a shared decision making process to CRT  ICD at this time.  Risks, benefits, alternatives to ICD implantation were discussed in detail with the patient today. The patient  understands that the risks include but are not limited to bleeding, infection, pneumothorax, perforation, tamponade, vascular damage, renal failure, MI, stroke, death, inappropriate shocks, and lead dislodgement and   wishes to proceed.     Given his habitus, we have discussed the procedure risks and benefits and doing it with the support of the anesthesia team.  He has multiple skin lesions the cause of which is not clear.  Various drugs have been implicated.  The most recent culprit his Coumadin.  Transitioning to NOAC may be of value.

## 2017-09-17 NOTE — Progress Notes (Signed)
He was      Patient Care Team: [name], MD as PCP - General Himmelrich, Bryson Ha, RD (Inactive) as Dietitian   HPI  [name] is a 61 y.o. male seen in followup for atrial fibrillation for which he is treated with amiodarone.  There was some concern regarding dyspnea so he underwent pulmonary function testing by Dr. Gwenette Greet;  he also has a history of syncope and is status post ILR insertion 6/16  He continues to struggle with shortness of breath. He also significant fatigue. He has sleep apnea but does not use CPAP. He does use oxygen.    Echocardiogram 7/13 demonstrated EF 20-25% with severe biatrial enlargement. Repeat echo 8/14 and demonstrated an intercurrent normalization of LV function with biatrial enlargement and mild RV dilatation    When he was last seen 9/18 by me and by Dr. Wallis Bamberg 10/18 the discussion was to proceed with CRT-D given his right bundle branch block with a QRS duration of 180 m    He had recurrent syncope 09/09/17 ILR >> pause > 6 sec   Functional status remains impaired with DOE < 100 ft; edema    DATE TEST     /2012 myoview  EF44 Prior IMI perfsion defect  /2015 myoview   EF57% Perfusion defect--MI vs bowel  2016 myoview 35-40 NO perfusion defect  6/16 Echo 40-45 Severe BAE  6/17 Cath 30 Global Hypokinesis With normal CA And normal pulm   3/18 Echo EF20-25%   4/18 LHC/RHC  No CAD   Antiarrhythmics Date  dofetilide 2013  amidoarone 2016   Date Cr K Hgb  8/18 1.22 4.5 12.2                   Past Medical History:  Diagnosis Date  . Anemia    supposed to be taking Vit B but doesn't  . ANXIETY    takes Xanax nightly  . Arthritis   . ASTHMA    Albuterol prn and Advair daily;also takes Prednisone daily  . Asthma   . Atrial fibrillation ([place]) 09/06/2015  . Cardiomyopathy [facility])    a. EF 25% TEE July 2013; b. EF normalized 2015;  c. 03/2015 Echo: EF 40-45%, difrf HK, PASP 38 mmHg, Mild MR, sev LAE/RAE.  [name] Chronic constipation    takes OTC stool softener  . COPD (chronic obstructive pulmonary disease) ([place])   . DEPRESSION    takes Zoloft and Doxepin daily  . Diverticulitis   . DYSKINESIA, ESOPHAGUS   . Essential hypertension       . FIBROMYALGIA   . GERD (gastroesophageal reflux disease)       . Glaucoma   . HYPERLIPIDEMIA    a. Intolerant to statins.  . INSOMNIA    takes Ambien nightly  . Myocardial infarction [facility])    a. 2012 Myoview notable for prior infarct;  b. 03/2015 Lexiscan CL: EF 37%, diff HK, small area of inferior infarct from apex to base-->Med Rx.  . Myocardial infarction ([place])   . O2 dependent    uses oxygen 2 and 1/2 liters per [place] at hs  . Paroxysmal atrial fibrillation (HCC)    a. CHA2DS2VASc = 3--> takes Coumadin;  b. 03/15/2015 Successful TEE/DCCV;  c. 03/2015 recurrent afib, Amio d/c'd in setting of hyperthyroidism.  . Peripheral neuropathy   . Pneumonia 12/2016  . Rash and other nonspecific skin eruption 04/12/2009   no cause found saw dermatologists x 2 and allergist  . SLEEP APNEA, OBSTRUCTIVE  a. doesn't use CPAP  . Syncope    a. 03/2015 s/p MDT LINQ.  [name] Type II diabetes mellitus ([place])         Past Surgical History:  Procedure Laterality Date  . ACNE CYST REMOVAL     2 on back   . ADENOIDECTOMY    . CARDIAC CATHETERIZATION N/A 03/21/2016   Procedure: Right/Left Heart Cath and Coronary Angiography;  Surgeon: Larey Dresser, MD;  Location: [place] CV LAB;  Service: Cardiovascular;  Laterality: N/A;  . CARDIOVERSION  04/18/2012   Procedure: CARDIOVERSION;  Surgeon: Fay Records, MD;  Location: [place];  Service: Cardiovascular;  Laterality: N/A;  . CARDIOVERSION  04/25/2012   Procedure: CARDIOVERSION;  Surgeon: Thayer Headings, MD;  Location: [place];  Service: Cardiovascular;  Laterality: N/A;  . CARDIOVERSION  04/25/2012   Procedure: CARDIOVERSION;  Surgeon: Fay Records, MD;  Location: [place];  Service: Cardiovascular;  Laterality: N/A;  . CARDIOVERSION  05/09/2012    Procedure: CARDIOVERSION;  Surgeon: Sherren Mocha, MD;  Location: [place];  Service: Cardiovascular;  Laterality: N/A;  changed from crenshaw to cooper by trish/leone-endo  . CARDIOVERSION N/A 03/15/2015   Procedure: CARDIOVERSION;  Surgeon: Thayer Headings, MD;  Location: [facility] ENDOSCOPY;  Service: Cardiovascular;  Laterality: N/A;  . COLONOSCOPY    . COLONOSCOPY WITH PROPOFOL N/A 10/21/2014   Procedure: COLONOSCOPY WITH PROPOFOL;  Surgeon: Ladene Artist, MD;  Location: [facility] ENDOSCOPY;  Service: Endoscopy;  Laterality: N/A;  . EP IMPLANTABLE DEVICE N/A 04/06/2015   Procedure: Loop Recorder Insertion;  Surgeon: Evans Lance, MD;  Location: [place] CV LAB;  Service: Cardiovascular;  Laterality: N/A;  . ESOPHAGOGASTRODUODENOSCOPY    . RIGHT/LEFT HEART CATH AND CORONARY ANGIOGRAPHY N/A 01/28/2017   Procedure: Right/Left Heart Cath and Coronary Angiography;  Surgeon: Larey Dresser, MD;  Location: [place] CV LAB;  Service: Cardiovascular;  Laterality: N/A;  . TEE WITHOUT CARDIOVERSION  04/25/2012   Procedure: TRANSESOPHAGEAL ECHOCARDIOGRAM (TEE);  Surgeon: Thayer Headings, MD;  Location: [place];  Service: Cardiovascular;  Laterality: N/A;  . TEE WITHOUT CARDIOVERSION N/A 03/15/2015   Procedure: TRANSESOPHAGEAL ECHOCARDIOGRAM (TEE);  Surgeon: Thayer Headings, MD;  Location: [place];  Service: Cardiovascular;  Laterality: N/A;  . TONSILLECTOMY    . TOTAL KNEE ARTHROPLASTY Right 06/15/2014   Procedure: TOTAL KNEE ARTHROPLASTY;  Surgeon: Renette Butters, MD;  Location: [place];  Service: Orthopedics;  Laterality: Right;    Current Outpatient Medications  Medication Sig Dispense Refill  . acetaminophen (TYLENOL) 500 MG tablet Take 500 mg by mouth every 6 (six) hours as needed.    [name] albuterol (VENTOLIN HFA) 108 (90 Base) MCG/ACT inhaler INHALE 2 PUFFS 4 TIMES A DAY as needed 54 Inhaler 3  . ALPRAZolam (XANAX) 0.5 MG tablet TAKE 1 TO 2 TABLETS BY MOUTH EVERY DAY AT BEDTIME AS NEEDED FOR SLEEP  180 tablet 1  . carisoprodol (SOMA) 350 MG tablet TAKE 1 TABLET BY MOUTH 3 TIMES A DAY AS NEEDED FOR MUSCLE SPASM 90 tablet 5  . carvedilol (COREG) 6.25 MG tablet Take 1.5 tablets (9.375 mg total) by mouth 2 (two) times daily with a meal. 90 tablet 3  . clobetasol ointment (TEMOVATE) 7.25 % Apply 1 application topically 2 (two) times daily. 60 g 1  . digoxin (LANOXIN) 0.25 MG tablet Take 0.5 tablets (0.125 mg total) by mouth daily. 30 tablet 11  . doxepin (SINEQUAN) 10 MG capsule Take 2 capsules (20 mg total) by mouth  at bedtime. 180 capsule 0  . fluticasone (FLONASE) 50 MCG/ACT nasal spray Place 2 sprays into both nostrils daily as needed for allergies or rhinitis. 18.2 g 5  . Fluticasone-Salmeterol (ADVAIR DISKUS) 250-50 MCG/DOSE AEPB Inhale 1 puff into the lungs 2 (two) times daily. 180 each 3  . linaclotide (LINZESS) 145 MCG CAPS capsule Take 1 capsule (145 mcg total) by mouth every other day. 30 capsule 11  . omeprazole (PRILOSEC) 20 MG capsule Take 1 capsule (20 mg total) by mouth daily with supper. 90 capsule 1  . potassium chloride SA (K-DUR,KLOR-CON) 20 MEQ tablet Take 2 tablets (40 mEq total) by mouth daily. 60 tablet 3  . sertraline (ZOLOFT) 100 MG tablet TAKE 2 TABLETS BY MOUTH AT BEDTIME 180 tablet 3  . spironolactone (ALDACTONE) 25 MG tablet Take 0.5 tablets (12.5 mg total) by mouth at bedtime. 15 tablet 3  . torsemide (DEMADEX) 20 MG tablet Take 60 mg (3 Tablets) in the AM and 40 mg (2 Tablets) in the PM. 150 tablet 3  . traMADol (ULTRAM) 50 MG tablet TAKE 1 TABLET BY MOUTH EVERY 6 HOURS AS NEEDED 30 tablet 2  . traZODone (DESYREL) 100 MG tablet Take 0.5 tablets (50 mg total) by mouth at bedtime as needed. 45 tablet 1  . TUMS 500 MG chewable tablet Chew 500-2,000 mg by mouth every 4 (four) hours as needed for indigestion or heartburn.     . valACYclovir (VALTREX) 1000 MG tablet Take 1 tablet (1,000 mg total) by mouth 3 (three) times daily. 21 tablet 2  . venlafaxine XR (EFFEXOR-XR)  37.5 MG 24 hr capsule TAKE ONE CAPSULE BY MOUTH EVERY DAY WITH BREAKFAST 90 capsule 1  . Vitamin D, Ergocalciferol, (DRISDOL) 50000 units CAPS capsule Take 1 capsule (50,000 Units total) by mouth every 7 (seven) days. 12 capsule 0  . warfarin (COUMADIN) 5 MG tablet Take as directed by anticoagulation clinic 30 tablet 3  . zolpidem (AMBIEN) 10 MG tablet TAKE 1 TABLET BY MOUTH AT BEDTIME 30 tablet 5  . lisinopril (PRINIVIL,ZESTRIL) 2.5 MG tablet Take 1 tablet (2.5 mg total) by mouth daily. 30 tablet 5   No current facility-administered medications for this visit.     Allergies  Allergen Reactions  . Amiodarone Other (See Comments)    hyperthyroidism  . Statins Other (See Comments)    myalgia  . Tape Other (See Comments)    Skin Tears Use Paper Tape Only    Review of Systems negative except from HPI and PMH  Physical Exam BP 102/68   Pulse 70   Ht 5\' 10"  (1.778 m)   Wt 253 lb 6.4 oz (114.9 kg)   BMI 36.36 kg/m  Well developed and nourished in no acute distress HENT normal Neck supple with JVP-flat Carotids brisk and full without bruits Clear Regular rate and rhythm, no murmurs or gallops Abd-soft with active BS without hepatomegaly No Clubbing cyanosis 1+edema Skin-warm and dry A & Oriented  Grossly normal sensory and motor function    ECG  afib 70 -/19/42 Axis left -84  -/19/44  Assessment and  Plan  Ischemic heart disease without significant obstructive disease by catheterization 6/17  HFrEF    Cardiomyopathy-ischemic/nonischemic  Atrial fibrillation  permanent  RBBB/LAD  Bradycardia  Syncope    Atrial fibrillation is permanent.   Cardiomyopathy continues to gradually worsen   Syncope now associated with documented complete heart block in the context of his atrial fibrillation and underlying bifascicular block.  We will need to undertake  device implantation.  Given the prior discussions, we will proceed with CRT.  This may help with his heart  failure and given his young age, we will undertake ICD implantation.  The patient's chart has been reviewed and they meet criteria for ICD implant.  I have had a thorough discussion with the patient reviewing options.  The patient and their family (if available) have had opportunities to ask questions and have them answered. The patient and I have decided together through a shared decision making process to CRT  ICD at this time.  Risks, benefits, alternatives to ICD implantation were discussed in detail with the patient today. The patient  understands that the risks include but are not limited to bleeding, infection, pneumothorax, perforation, tamponade, vascular damage, renal failure, MI, stroke, death, inappropriate shocks, and lead dislodgement and   wishes to proceed.     Given his habitus, we have discussed the procedure risks and benefits and doing it with the support of the anesthesia team.  He has multiple skin lesions the cause of which is not clear.  Various drugs have been implicated.  The most recent culprit his Coumadin.  Transitioning to NOAC may be of value.

## 2017-09-18 ENCOUNTER — Ambulatory Visit (HOSPITAL_COMMUNITY): Payer: Medicare HMO | Admitting: Anesthesiology

## 2017-09-18 ENCOUNTER — Encounter (HOSPITAL_COMMUNITY): Payer: Self-pay | Admitting: Certified Registered"

## 2017-09-18 ENCOUNTER — Other Ambulatory Visit: Payer: Self-pay

## 2017-09-18 ENCOUNTER — Ambulatory Visit (HOSPITAL_COMMUNITY)
Admission: RE | Disposition: A | Discharge status: Home / Self Care | Payer: Self-pay | Source: Ambulatory Visit | Attending: Internal Medicine

## 2017-09-18 ENCOUNTER — Ambulatory Visit (HOSPITAL_COMMUNITY)
Admission: RE | Admit: 2017-09-18 | Discharge: 2017-09-19 | Disposition: A | Discharge status: Home / Self Care | Payer: Medicare HMO | Source: Ambulatory Visit | Attending: Internal Medicine | Admitting: Internal Medicine

## 2017-09-18 DIAGNOSIS — Z9981 Dependence on supplemental oxygen: Secondary | ICD-10-CM | POA: Insufficient documentation

## 2017-09-18 DIAGNOSIS — K5909 Other constipation: Secondary | ICD-10-CM | POA: Insufficient documentation

## 2017-09-18 DIAGNOSIS — I5022 Chronic systolic (congestive) heart failure: Secondary | ICD-10-CM | POA: Insufficient documentation

## 2017-09-18 DIAGNOSIS — F329 Major depressive disorder, single episode, unspecified: Secondary | ICD-10-CM | POA: Diagnosis not present

## 2017-09-18 DIAGNOSIS — R55 Syncope and collapse: Secondary | ICD-10-CM | POA: Diagnosis present

## 2017-09-18 DIAGNOSIS — I482 Chronic atrial fibrillation: Secondary | ICD-10-CM | POA: Diagnosis not present

## 2017-09-18 DIAGNOSIS — Z7901 Long term (current) use of anticoagulants: Secondary | ICD-10-CM | POA: Insufficient documentation

## 2017-09-18 DIAGNOSIS — H409 Unspecified glaucoma: Secondary | ICD-10-CM | POA: Insufficient documentation

## 2017-09-18 DIAGNOSIS — I509 Heart failure, unspecified: Secondary | ICD-10-CM | POA: Diagnosis present

## 2017-09-18 DIAGNOSIS — I4510 Unspecified right bundle-branch block: Secondary | ICD-10-CM | POA: Diagnosis not present

## 2017-09-18 DIAGNOSIS — I252 Old myocardial infarction: Secondary | ICD-10-CM | POA: Diagnosis not present

## 2017-09-18 DIAGNOSIS — I428 Other cardiomyopathies: Secondary | ICD-10-CM

## 2017-09-18 DIAGNOSIS — F419 Anxiety disorder, unspecified: Secondary | ICD-10-CM | POA: Insufficient documentation

## 2017-09-18 DIAGNOSIS — M797 Fibromyalgia: Secondary | ICD-10-CM | POA: Insufficient documentation

## 2017-09-18 DIAGNOSIS — E785 Hyperlipidemia, unspecified: Secondary | ICD-10-CM | POA: Diagnosis not present

## 2017-09-18 DIAGNOSIS — Z87891 Personal history of nicotine dependence: Secondary | ICD-10-CM | POA: Insufficient documentation

## 2017-09-18 DIAGNOSIS — I5020 Unspecified systolic (congestive) heart failure: Secondary | ICD-10-CM

## 2017-09-18 DIAGNOSIS — M1990 Unspecified osteoarthritis, unspecified site: Secondary | ICD-10-CM | POA: Insufficient documentation

## 2017-09-18 DIAGNOSIS — Z959 Presence of cardiac and vascular implant and graft, unspecified: Secondary | ICD-10-CM

## 2017-09-18 DIAGNOSIS — I442 Atrioventricular block, complete: Secondary | ICD-10-CM | POA: Insufficient documentation

## 2017-09-18 DIAGNOSIS — J449 Chronic obstructive pulmonary disease, unspecified: Secondary | ICD-10-CM | POA: Diagnosis not present

## 2017-09-18 DIAGNOSIS — Z96651 Presence of right artificial knee joint: Secondary | ICD-10-CM | POA: Insufficient documentation

## 2017-09-18 DIAGNOSIS — I110 Hypertensive heart disease with heart failure: Secondary | ICD-10-CM | POA: Insufficient documentation

## 2017-09-18 DIAGNOSIS — Z79899 Other long term (current) drug therapy: Secondary | ICD-10-CM | POA: Diagnosis not present

## 2017-09-18 DIAGNOSIS — I452 Bifascicular block: Secondary | ICD-10-CM | POA: Diagnosis present

## 2017-09-18 DIAGNOSIS — G4733 Obstructive sleep apnea (adult) (pediatric): Secondary | ICD-10-CM | POA: Diagnosis not present

## 2017-09-18 DIAGNOSIS — E1140 Type 2 diabetes mellitus with diabetic neuropathy, unspecified: Secondary | ICD-10-CM | POA: Insufficient documentation

## 2017-09-18 DIAGNOSIS — R001 Bradycardia, unspecified: Secondary | ICD-10-CM | POA: Insufficient documentation

## 2017-09-18 DIAGNOSIS — I38 Endocarditis, valve unspecified: Secondary | ICD-10-CM

## 2017-09-18 DIAGNOSIS — G4700 Insomnia, unspecified: Secondary | ICD-10-CM | POA: Diagnosis not present

## 2017-09-18 DIAGNOSIS — Z006 Encounter for examination for normal comparison and control in clinical research program: Secondary | ICD-10-CM | POA: Diagnosis not present

## 2017-09-18 DIAGNOSIS — I4819 Other persistent atrial fibrillation: Secondary | ICD-10-CM | POA: Diagnosis present

## 2017-09-18 HISTORY — DX: Presence of automatic (implantable) cardiac defibrillator: Z95.810

## 2017-09-18 HISTORY — DX: Emphysema, unspecified: J43.9

## 2017-09-18 HISTORY — PX: LOOP RECORDER REMOVAL: EP1215

## 2017-09-18 HISTORY — PX: BIV ICD INSERTION CRT-D: EP1195

## 2017-09-18 LAB — PROTIME-INR
INR: 1.36
PROTHROMBIN TIME: 16.7 s — AB (ref 11.4–15.2)

## 2017-09-18 LAB — SURGICAL PCR SCREEN
MRSA, PCR: NEGATIVE
STAPHYLOCOCCUS AUREUS: NEGATIVE

## 2017-09-18 LAB — GLUCOSE, CAPILLARY: Glucose-Capillary: 90 mg/dL (ref 65–99)

## 2017-09-18 SURGERY — BIV ICD INSERTION CRT-D
Anesthesia: Monitor Anesthesia Care

## 2017-09-18 MED ORDER — DOXEPIN HCL 10 MG PO CAPS
20.0000 mg | ORAL_CAPSULE | Freq: Every day | ORAL | Status: DC
  Administered 2017-09-18: 20 mg via ORAL
  Filled 2017-09-18: qty 2

## 2017-09-18 MED ORDER — LIDOCAINE 2% (20 MG/ML) 5 ML SYRINGE
INTRAMUSCULAR | Status: DC | PRN
  Administered 2017-09-18: 40 mg via INTRAVENOUS

## 2017-09-18 MED ORDER — SODIUM CHLORIDE 0.9 % IV SOLN: INTRAVENOUS | Status: AC

## 2017-09-18 MED ORDER — ZOLPIDEM TARTRATE 5 MG PO TABS
10.0000 mg | ORAL_TABLET | Freq: Every day | ORAL | Status: DC
  Administered 2017-09-18: 22:00:00 10 mg via ORAL
  Filled 2017-09-18: qty 2

## 2017-09-18 MED ORDER — DIGOXIN 125 MCG PO TABS
0.1250 mg | ORAL_TABLET | Freq: Every day | ORAL | Status: DC
  Administered 2017-09-18 – 2017-09-19 (×2): 0.125 mg via ORAL
  Filled 2017-09-18 (×2): qty 1

## 2017-09-18 MED ORDER — PANTOPRAZOLE SODIUM 40 MG PO TBEC
40.0000 mg | DELAYED_RELEASE_TABLET | Freq: Every day | ORAL | Status: DC
  Administered 2017-09-18 – 2017-09-19 (×2): 40 mg via ORAL
  Filled 2017-09-18 (×2): qty 1

## 2017-09-18 MED ORDER — BUPIVACAINE HCL (PF) 0.25 % IJ SOLN
INTRAMUSCULAR | Status: DC | PRN
  Administered 2017-09-18: 45 mL

## 2017-09-18 MED ORDER — SODIUM CHLORIDE 0.9 % IV SOLN
INTRAVENOUS | Status: DC
  Administered 2017-09-18: 07:00:00 via INTRAVENOUS

## 2017-09-18 MED ORDER — POTASSIUM CHLORIDE CRYS ER 20 MEQ PO TBCR
40.0000 meq | EXTENDED_RELEASE_TABLET | Freq: Every day | ORAL | Status: DC
  Administered 2017-09-18 – 2017-09-19 (×2): 40 meq via ORAL
  Filled 2017-09-18 (×2): qty 2

## 2017-09-18 MED ORDER — CARISOPRODOL 350 MG PO TABS
350.0000 mg | ORAL_TABLET | Freq: Three times a day (TID) | ORAL | Status: DC
  Administered 2017-09-18 – 2017-09-19 (×3): 350 mg via ORAL
  Filled 2017-09-18 (×3): qty 1

## 2017-09-18 MED ORDER — MOMETASONE FURO-FORMOTEROL FUM 200-5 MCG/ACT IN AERO
2.0000 | INHALATION_SPRAY | Freq: Two times a day (BID) | RESPIRATORY_TRACT | Status: DC
  Administered 2017-09-19: 08:00:00 2 via RESPIRATORY_TRACT
  Filled 2017-09-18: qty 8.8

## 2017-09-18 MED ORDER — MUPIROCIN 2 % EX OINT: 1.0000 "application " | TOPICAL_OINTMENT | Freq: Once | CUTANEOUS | Status: DC

## 2017-09-18 MED ORDER — LISINOPRIL 2.5 MG PO TABS
2.5000 mg | ORAL_TABLET | Freq: Every day | ORAL | Status: DC
  Administered 2017-09-18 – 2017-09-19 (×2): 2.5 mg via ORAL
  Filled 2017-09-18 (×2): qty 1

## 2017-09-18 MED ORDER — LINACLOTIDE 145 MCG PO CAPS
145.0000 ug | ORAL_CAPSULE | ORAL | Status: DC
  Filled 2017-09-18: qty 1

## 2017-09-18 MED ORDER — ACETAMINOPHEN 500 MG PO TABS: 500.0000 mg | ORAL_TABLET | Freq: Four times a day (QID) | ORAL | Status: DC | PRN

## 2017-09-18 MED ORDER — ONDANSETRON HCL 4 MG/2ML IJ SOLN: 4.0000 mg | Freq: Once | INTRAMUSCULAR | Status: DC | PRN

## 2017-09-18 MED ORDER — HEPARIN (PORCINE) IN NACL 2-0.9 UNIT/ML-% IJ SOLN
INTRAMUSCULAR | Status: AC
  Filled 2017-09-18: qty 500

## 2017-09-18 MED ORDER — WARFARIN SODIUM 2.5 MG PO TABS
2.5000 mg | ORAL_TABLET | Freq: Once | ORAL | Status: AC
  Administered 2017-09-18: 17:00:00 2.5 mg via ORAL
  Filled 2017-09-18: qty 1

## 2017-09-18 MED ORDER — HEPARIN (PORCINE) IN NACL 2-0.9 UNIT/ML-% IJ SOLN
INTRAMUSCULAR | Status: AC | PRN
  Administered 2017-09-18: 500 mL

## 2017-09-18 MED ORDER — IOPAMIDOL (ISOVUE-370) INJECTION 76%
INTRAVENOUS | Status: AC
  Filled 2017-09-18: qty 50

## 2017-09-18 MED ORDER — TORSEMIDE 20 MG PO TABS
80.0000 mg | ORAL_TABLET | Freq: Once | ORAL | Status: AC
  Administered 2017-09-18: 16:00:00 80 mg via ORAL
  Filled 2017-09-18: qty 4

## 2017-09-18 MED ORDER — BUPIVACAINE HCL (PF) 0.25 % IJ SOLN
INTRAMUSCULAR | Status: AC
  Filled 2017-09-18: qty 60

## 2017-09-18 MED ORDER — VENLAFAXINE HCL ER 37.5 MG PO CP24
37.5000 mg | ORAL_CAPSULE | Freq: Every day | ORAL | Status: DC
  Administered 2017-09-19: 37.5 mg via ORAL
  Filled 2017-09-18: qty 1

## 2017-09-18 MED ORDER — PROPOFOL 10 MG/ML IV BOLUS
INTRAVENOUS | Status: DC | PRN
  Administered 2017-09-18 (×6): 20 mg via INTRAVENOUS
  Administered 2017-09-18: 30 mg via INTRAVENOUS

## 2017-09-18 MED ORDER — SPIRONOLACTONE 12.5 MG HALF TABLET
12.5000 mg | ORAL_TABLET | Freq: Every day | ORAL | Status: DC
  Administered 2017-09-18: 22:00:00 12.5 mg via ORAL
  Filled 2017-09-18: qty 1

## 2017-09-18 MED ORDER — SODIUM CHLORIDE 0.9 % IR SOLN
Status: AC
  Filled 2017-09-18: qty 2

## 2017-09-18 MED ORDER — CARVEDILOL 6.25 MG PO TABS
9.3750 mg | ORAL_TABLET | Freq: Two times a day (BID) | ORAL | Status: DC
  Administered 2017-09-18 – 2017-09-19 (×2): 9.375 mg via ORAL
  Filled 2017-09-18 (×2): qty 1

## 2017-09-18 MED ORDER — ALBUTEROL SULFATE (2.5 MG/3ML) 0.083% IN NEBU: 2.5000 mg | INHALATION_SOLUTION | Freq: Four times a day (QID) | RESPIRATORY_TRACT | Status: DC | PRN

## 2017-09-18 MED ORDER — MUPIROCIN 2 % EX OINT
TOPICAL_OINTMENT | CUTANEOUS | Status: AC
  Filled 2017-09-18: qty 22

## 2017-09-18 MED ORDER — CEFAZOLIN SODIUM-DEXTROSE 2-4 GM/100ML-% IV SOLN
2.0000 g | INTRAVENOUS | Status: AC
  Administered 2017-09-18: 2 g via INTRAVENOUS
  Filled 2017-09-18: qty 100

## 2017-09-18 MED ORDER — MIDAZOLAM HCL 5 MG/5ML IJ SOLN
INTRAMUSCULAR | Status: DC | PRN
  Administered 2017-09-18 (×4): 1 mg via INTRAVENOUS

## 2017-09-18 MED ORDER — FLUTICASONE PROPIONATE 50 MCG/ACT NA SUSP
2.0000 | Freq: Every day | NASAL | Status: DC | PRN
  Filled 2017-09-18: qty 16

## 2017-09-18 MED ORDER — CEFAZOLIN SODIUM-DEXTROSE 1-4 GM/50ML-% IV SOLN
1.0000 g | Freq: Four times a day (QID) | INTRAVENOUS | Status: AC
  Administered 2017-09-18 – 2017-09-19 (×3): 1 g via INTRAVENOUS
  Filled 2017-09-18 (×3): qty 50

## 2017-09-18 MED ORDER — ONDANSETRON HCL 4 MG/2ML IJ SOLN
4.0000 mg | Freq: Four times a day (QID) | INTRAMUSCULAR | Status: DC | PRN
Start: 2017-09-18 — End: 2017-09-19

## 2017-09-18 MED ORDER — SODIUM CHLORIDE 0.9 % IR SOLN
80.0000 mg | Status: DC
  Filled 2017-09-18: qty 2

## 2017-09-18 MED ORDER — TRAZODONE HCL 50 MG PO TABS: 50.0000 mg | ORAL_TABLET | Freq: Every evening | ORAL | Status: DC | PRN

## 2017-09-18 MED ORDER — FENTANYL CITRATE (PF) 100 MCG/2ML IJ SOLN: 25.0000 ug | INTRAMUSCULAR | Status: DC | PRN

## 2017-09-18 MED ORDER — VITAMIN D (ERGOCALCIFEROL) 1.25 MG (50000 UNIT) PO CAPS
50000.0000 [IU] | ORAL_CAPSULE | ORAL | Status: DC
Start: 2017-09-21 — End: 2017-09-19

## 2017-09-18 MED ORDER — PROPOFOL 500 MG/50ML IV EMUL
INTRAVENOUS | Status: DC | PRN
  Administered 2017-09-18: 50 ug/kg/min via INTRAVENOUS

## 2017-09-18 MED ORDER — ACETAMINOPHEN 325 MG PO TABS: 325.0000 mg | ORAL_TABLET | ORAL | Status: DC | PRN

## 2017-09-18 MED ORDER — ONDANSETRON HCL 4 MG/2ML IJ SOLN
INTRAMUSCULAR | Status: DC | PRN
  Administered 2017-09-18: 4 mg via INTRAVENOUS

## 2017-09-18 MED ORDER — ALPRAZOLAM 0.25 MG PO TABS: 0.2500 mg | ORAL_TABLET | Freq: Every evening | ORAL | Status: DC | PRN

## 2017-09-18 MED ORDER — WARFARIN - PHYSICIAN DOSING INPATIENT
Freq: Every day | Status: DC
  Administered 2017-09-18: 18:00:00

## 2017-09-18 MED ORDER — TRAMADOL HCL 50 MG PO TABS: 50.0000 mg | ORAL_TABLET | Freq: Four times a day (QID) | ORAL | Status: DC | PRN

## 2017-09-18 MED ORDER — SODIUM CHLORIDE 0.9 % IV SOLN
INTRAVENOUS | Status: DC
Start: 2017-09-18 — End: 2017-09-18
  Administered 2017-09-18: 07:00:00 via INTRAVENOUS

## 2017-09-18 MED ORDER — YOU HAVE A PACEMAKER BOOK
Freq: Once | Status: AC
  Administered 2017-09-19
  Filled 2017-09-18: qty 1

## 2017-09-18 MED ORDER — CALCIUM CARBONATE ANTACID 500 MG PO CHEW
500.0000 mg | CHEWABLE_TABLET | ORAL | Status: DC | PRN
  Filled 2017-09-18: qty 1

## 2017-09-18 MED ORDER — CLOBETASOL PROPIONATE 0.05 % EX OINT
1.0000 "application " | TOPICAL_OINTMENT | Freq: Two times a day (BID) | CUTANEOUS | Status: DC
  Administered 2017-09-18 – 2017-09-19 (×2): 1 via TOPICAL
  Filled 2017-09-18: qty 15

## 2017-09-18 MED ORDER — CEFAZOLIN SODIUM-DEXTROSE 2-4 GM/100ML-% IV SOLN
INTRAVENOUS | Status: AC
  Filled 2017-09-18: qty 100

## 2017-09-18 MED ORDER — FENTANYL CITRATE (PF) 100 MCG/2ML IJ SOLN
INTRAMUSCULAR | Status: DC | PRN
  Administered 2017-09-18 (×2): 50 ug via INTRAVENOUS

## 2017-09-18 MED ORDER — CHLORHEXIDINE GLUCONATE 4 % EX LIQD
60.0000 mL | Freq: Once | CUTANEOUS | Status: DC
  Filled 2017-09-18: qty 60

## 2017-09-18 SURGICAL SUPPLY — 20 items
ADAPTER SEALING SSA-EW-09 (MISCELLANEOUS) ×1 IMPLANT
ADPR INTRO LNG 9FR SL XTD WNG (MISCELLANEOUS) ×1
BALLN ATTAIN 80 (BALLOONS) ×2
BALLOON ATTAIN 80 (BALLOONS) IMPLANT
CABLE SURGICAL S-101-97-12 (CABLE) ×1 IMPLANT
CATH ATTAIN SEL SURV 6248V-90 (CATHETERS) ×1 IMPLANT
CATH CPS DIRECT 135 DS2C020 (CATHETERS) ×1 IMPLANT
HEMOSTAT SURGICEL 2X4 FIBR (HEMOSTASIS) ×1 IMPLANT
ICD CLARIA MRI DTMA1QQ (ICD Generator) ×1 IMPLANT
KIT ESSENTIALS PG (KITS) ×1 IMPLANT
LEAD ATTAIN PERFORM ST 4398-88 (Lead) ×1 IMPLANT
LEAD SPRINT QUAT SEC 6935M-62 (Lead) ×1 IMPLANT
PAD DEFIB LIFELINK (PAD) ×1 IMPLANT
SHEATH CLASSIC 9.5F (SHEATH) ×1 IMPLANT
SHEATH CLASSIC 9F (SHEATH) ×1 IMPLANT
SHIELD RADPAD SCOOP 12X17 (MISCELLANEOUS) ×1 IMPLANT
SLITTER UNIVERSAL DS2A003 (MISCELLANEOUS) ×1 IMPLANT
TRAY PACEMAKER INSERTION (PACKS) ×1 IMPLANT
WIRE ACUITY WHISPER EDS 4648 (WIRE) ×1 IMPLANT
WIRE HI TORQ VERSACORE-J 145CM (WIRE) ×1 IMPLANT

## 2017-09-18 NOTE — Interval H&P Note (Signed)
ICD Criteria  Current LVEF:20%. Within 12 months prior to implant: Yes   Heart failure history: Yes, Class III  Cardiomyopathy history: Yes, Non-Ischemic Cardiomyopathy.  Atrial Fibrillation/Atrial Flutter: Yes, Permanent.  Ventricular tachycardia history: No.  Cardiac arrest history: No.  History of syndromes with risk of sudden death: No.  Previous ICD: No.  Current ICD indication: Primary  PPM indication: Yes. Pacing type: Ventricular. Less than 40% RV pacing requirement anticipated. Indication: Complete Heart Block   Class I or II Bradycardia indication present: Yes  Beta Blocker therapy for 3 or more months: Yes, prescribed.   Ace Inhibitor/ARB therapy for 3 or more months: Yes, prescribed.   History and Physical Interval Note:  09/18/2017 7:45 AM  [name]  has presented today for surgery, with the diagnosis of hf  The various methods of treatment have been discussed with the patient and family. After consideration of risks, benefits and other options for treatment, the patient has consented to  Procedure(s): ICD IMPLANT (N/A) as a surgical intervention .  The patient's history has been reviewed, patient examined, no change in status, stable for surgery.  I have reviewed the patient's chart and labs.  Questions were answered to the patient's satisfaction.     Virl Axe

## 2017-09-18 NOTE — Transfer of Care (Signed)
Immediate Anesthesia Transfer of Care Note  Patient: [name]  Procedure(s) Performed: BIV ICD INSERTION CRT-D (N/A ) LOOP RECORDER REMOVAL (N/A )  Patient Location: Cath Lab  Anesthesia Type:MAC  Level of Consciousness: awake, alert  and patient cooperative  Airway & Oxygen Therapy: Patient Spontanous Breathing and Patient connected to nasal cannula oxygen  Post-op Assessment: Report given to RN, Post -op Vital signs reviewed and stable and Patient moving all extremities X 4  Post vital signs: Reviewed and stable  Last Vitals:  Vitals:   09/18/17 0632  BP: 118/67  Pulse: 74  Temp: 36.9 C  SpO2: 98%    Last Pain:  Vitals:   09/18/17 0637  TempSrc:   PainSc: 7       Patients Stated Pain Goal: 3 (07/31/50 0258)  Complications: No apparent anesthesia complications

## 2017-09-18 NOTE — Anesthesia Procedure Notes (Signed)
Procedure Name: MAC Date/Time: 09/18/2017 8:39 AM Performed by: Orlie Dakin, CRNA Pre-anesthesia Checklist: Patient identified, Emergency Drugs available, Suction available, Patient being monitored and Timeout performed Patient Re-evaluated:Patient Re-evaluated prior to induction Oxygen Delivery Method: Simple face mask Preoxygenation: Pre-oxygenation with 100% oxygen

## 2017-09-18 NOTE — Discharge Summary (Signed)
ELECTROPHYSIOLOGY PROCEDURE DISCHARGE SUMMARY    Patient ID: [name],  MRN: [id number], DOB/AGE: 01-06-1956 61 y.o.  Admit date: 09/18/2017 Discharge date: 09/18/17  Primary Care Physician: [name], MD  Primary Cardiologist: Dr. [name] Electrophysiologist: Dr. [name]  Primary Discharge Diagnosis:  1. NICM 2. Permanent AFib     CHA2DS2Vasc is 2, on warfarin 3. Syncope     2/2 sinus pause, conduction system disease  Secondary Discharge Diagnosis:  1. COPD, chronic O2 2. Chronic CHF (systolic) 3. HTN 4. LBBB  Allergies  Allergen Reactions  . Amiodarone Other (See Comments)    hyperthyroidism  . Statins Other (See Comments)    myalgia  . Tape Other (See Comments)    Skin Tears Use Paper Tape Only     Procedures This Admission:  1.  Implantation of a MDT CRT-D on 09/18/17 by Dr [name].   See procedure report for full details 2. explant LOOP 3.  CXR demonstrated no pneumothorax status post device implantation. \Personally reviewed  And antreior location of the lead  Brief HPI:  [name] is a 61 y.o. male was referred to electrophysiology in the outpatient setting for consideration of ICD implantation.  Past medical history includes NICM, permanent AFib, COPD, HTN, no obstructive CAD, RBBB.  The patient has persistent and worsening LV dysfunction despite guideline directed therapy.  Risks, benefits, and alternatives to ICD implantation were reviewed with the patient who wished to proceed.   Hospital Course:  The patient was admitted and underwent implantation of an ICD with details as outlined above. He was monitored on telemetry overnight which demonstrated afib.  Left chest was without hematoma or ecchymosis.  The device was interrogated and found to be functioning normally.  CXR was obtained and demonstrated no pneumothorax status post device implantation.  Wound care, arm mobility, and restrictions were reviewed with the patient.  The patient was  examined by Dr.  [name] and considered stable for discharge to home. Plans for AVNode ablation are being arranged.  I have staff-messaged Dr. Olin Pia procedure scheduler/CMA to call the patient and schedule.  The patient and his daughter are aware to expect the call.  The patient's discharge medications include an ACE-I (losartan) and beta blocker (carvedilol).   Physical Exam: Vitals:   09/18/17 2211 09/19/17 0505 09/19/17 0732 09/19/17 0821  BP: 112/66 91/62 108/69   Pulse: 71 68 79 93  Resp: 16 18 (!) 22 (!) 21  Temp: (!) 97.4 F (36.3 C) 98.4 F (36.9 C) (!) 97.5 F (36.4 C)   TempSrc: Oral Oral Oral   SpO2: 99% 97% 100% 100%  Weight:  251 lb 5.2 oz (114 kg)    Height:        Well developed and nourished in no acute distress HENT normal Neck supple with JVP-flat Carotids brisk and full without bruits Clear Irregular rate and rhythm, no murmurs or gallops Abd-soft with active BS without hepatomegaly No Clubbing cyanosis edema Skin-warm and dry A & Oriented  Grossly normal sensory and motor function   Labs:   Lab Results  Component Value Date   WBC 8.8 09/17/2017   HGB 13.2 09/17/2017   HCT 38.7 09/17/2017   MCV 98 (H) 09/17/2017   PLT 197 09/17/2017    Recent Labs  Lab 09/17/17 1452  NA 139  K 4.2  CL 102  CO2 28  BUN 24  CREATININE 0.84  CALCIUM 9.3  GLUCOSE 95    Discharge Medications:  Allergies  as of 09/19/2017      Reactions   Amiodarone Other (See Comments)   hyperthyroidism   Statins Other (See Comments)   myalgia   Tape Other (See Comments)   Skin Tears Use Paper Tape Only      Medication List    STOP taking these medications   lisinopril 2.5 MG tablet Commonly known as:  PRINIVIL,ZESTRIL     TAKE these medications   acetaminophen 500 MG tablet Commonly known as:  TYLENOL Take 500 mg by mouth every 6 (six) hours as needed.   albuterol 108 (90 Base) MCG/ACT inhaler Commonly known as:  VENTOLIN HFA INHALE 2 PUFFS 4 TIMES A DAY as  needed What changed:    how much to take  how to take this  when to take this  reasons to take this  additional instructions   ALPRAZolam 0.5 MG tablet Commonly known as:  XANAX TAKE 1 TO 2 TABLETS BY MOUTH EVERY DAY AT BEDTIME AS NEEDED FOR SLEEP What changed:  See the new instructions.   carisoprodol 350 MG tablet Commonly known as:  SOMA TAKE 1 TABLET BY MOUTH 3 TIMES A DAY AS NEEDED FOR MUSCLE SPASM What changed:  See the new instructions.   carvedilol 6.25 MG tablet Commonly known as:  COREG Take 1.5 tablets (9.375 mg total) by mouth 2 (two) times daily with a meal.   clobetasol ointment 0.05 % Commonly known as:  TEMOVATE Apply 1 application topically 2 (two) times daily.   digoxin 0.25 MG tablet Commonly known as:  LANOXIN Take 0.5 tablets (0.125 mg total) by mouth daily.   doxepin 10 MG capsule Commonly known as:  SINEQUAN Take 2 capsules (20 mg total) by mouth at bedtime.   fluticasone 50 MCG/ACT nasal spray Commonly known as:  FLONASE Place 2 sprays into both nostrils daily as needed for allergies or rhinitis.   Fluticasone-Salmeterol 250-50 MCG/DOSE Aepb Commonly known as:  ADVAIR DISKUS Inhale 1 puff into the lungs 2 (two) times daily. What changed:    when to take this  reasons to take this   linaclotide 145 MCG Caps capsule Commonly known as:  LINZESS Take 1 capsule (145 mcg total) by mouth every other day.   losartan 25 MG tablet Commonly known as:  COZAAR Take 1 tablet (25 mg total) by mouth daily.   omeprazole 20 MG capsule Commonly known as:  PRILOSEC Take 1 capsule (20 mg total) by mouth daily with supper.   potassium chloride SA 20 MEQ tablet Commonly known as:  K-DUR,KLOR-CON Take 2 tablets (40 mEq total) by mouth daily.   sertraline 100 MG tablet Commonly known as:  ZOLOFT TAKE 2 TABLETS BY MOUTH AT BEDTIME   spironolactone 25 MG tablet Commonly known as:  ALDACTONE Take 0.5 tablets (12.5 mg total) by mouth at bedtime.     torsemide 20 MG tablet Commonly known as:  DEMADEX Take 60 mg (3 Tablets) in the AM and 40 mg (2 Tablets) in the PM.   traMADol 50 MG tablet Commonly known as:  ULTRAM TAKE 1 TABLET BY MOUTH EVERY 6 HOURS AS NEEDED What changed:    how much to take  how to take this  when to take this   traZODone 100 MG tablet Commonly known as:  DESYREL Take 0.5 tablets (50 mg total) by mouth at bedtime as needed.   TUMS 500 MG chewable tablet Generic drug:  calcium carbonate Chew 500-2,000 mg by mouth every 4 (four) hours as needed for  indigestion or heartburn.   valACYclovir 1000 MG tablet Commonly known as:  VALTREX Take 1 tablet (1,000 mg total) by mouth 3 (three) times daily. What changed:    when to take this  reasons to take this   venlafaxine XR 37.5 MG 24 hr capsule Commonly known as:  EFFEXOR-XR TAKE ONE CAPSULE BY MOUTH EVERY DAY WITH BREAKFAST   Vitamin D (Ergocalciferol) 50000 units Caps capsule Commonly known as:  DRISDOL Take 1 capsule (50,000 Units total) by mouth every 7 (seven) days.   warfarin 5 MG tablet Commonly known as:  COUMADIN Take as directed. If you are unsure how to take this medication, talk to your nurse or doctor. Original instructions:  Take as directed by anticoagulation clinic What changed:    how much to take  how to take this  when to take this  additional instructions   zolpidem 10 MG tablet Commonly known as:  AMBIEN TAKE 1 TABLET BY MOUTH AT BEDTIME       Disposition:  Home  Discharge Instructions    Diet - low sodium heart healthy   Complete by:  As directed    Increase activity slowly   Complete by:  As directed      Follow-up Information    [facility] Follow up on 09/24/2017.   Specialty:  Cardiology Why:  3:00PM, coumadin clinic Contact information: [address], [place]       [facility] Follow up on 09/30/2017.    Specialty:  Cardiology Why:  3:00PM, wound check visit Contact information: [address] [place]       Deboraha Sprang, MD Follow up on 12/19/2017.   Specialty:  Cardiology Why:  2:00PM Contact information: [address] [address] [phone]           Duration of Discharge Encounter: Greater than 30 minutes including physician time.  Signed, Tommye Standard, PA-C 09/19/2017 9:36 AM  Will add low dose dilt to BB to see if we can slow down the heart rate  Will anticipate AV ablation in a few weeks, but am less enthusiastic given the anterior location of the anterolateral lead being more anterior than desired

## 2017-09-18 NOTE — Anesthesia Postprocedure Evaluation (Signed)
Anesthesia Post Note  Patient: [name]  Procedure(s) Performed: BIV ICD INSERTION CRT-D (N/A ) LOOP RECORDER REMOVAL (N/A )     Patient location during evaluation: PACU Anesthesia Type: MAC Level of consciousness: awake and alert Pain management: pain level controlled Vital Signs Assessment: post-procedure vital signs reviewed and stable Respiratory status: spontaneous breathing, nonlabored ventilation and respiratory function stable Cardiovascular status: stable and blood pressure returned to baseline Anesthetic complications: no    Last Vitals:  Vitals:   09/18/17 1147 09/18/17 1500  BP: (!) 129/55 (!) 119/48  Pulse: (!) 58 68  Resp: 18 17  Temp: 36.8 C 36.8 C  SpO2: 100% 99%    Last Pain:  Vitals:   09/18/17 1500  TempSrc: Oral  PainSc:                  Audry Pili

## 2017-09-19 ENCOUNTER — Ambulatory Visit (HOSPITAL_COMMUNITY): Payer: Medicare HMO

## 2017-09-19 DIAGNOSIS — I482 Chronic atrial fibrillation: Secondary | ICD-10-CM | POA: Diagnosis not present

## 2017-09-19 DIAGNOSIS — G4733 Obstructive sleep apnea (adult) (pediatric): Secondary | ICD-10-CM | POA: Diagnosis not present

## 2017-09-19 DIAGNOSIS — I4510 Unspecified right bundle-branch block: Secondary | ICD-10-CM | POA: Diagnosis not present

## 2017-09-19 DIAGNOSIS — R55 Syncope and collapse: Secondary | ICD-10-CM | POA: Diagnosis not present

## 2017-09-19 DIAGNOSIS — Z006 Encounter for examination for normal comparison and control in clinical research program: Secondary | ICD-10-CM | POA: Diagnosis not present

## 2017-09-19 DIAGNOSIS — Z4682 Encounter for fitting and adjustment of non-vascular catheter: Secondary | ICD-10-CM | POA: Diagnosis not present

## 2017-09-19 DIAGNOSIS — Z9981 Dependence on supplemental oxygen: Secondary | ICD-10-CM | POA: Diagnosis not present

## 2017-09-19 DIAGNOSIS — I252 Old myocardial infarction: Secondary | ICD-10-CM | POA: Diagnosis not present

## 2017-09-19 DIAGNOSIS — I428 Other cardiomyopathies: Secondary | ICD-10-CM | POA: Diagnosis not present

## 2017-09-19 DIAGNOSIS — J449 Chronic obstructive pulmonary disease, unspecified: Secondary | ICD-10-CM | POA: Diagnosis not present

## 2017-09-19 LAB — PROTIME-INR
INR: 1.61
PROTHROMBIN TIME: 19 s — AB (ref 11.4–15.2)

## 2017-09-19 MED ORDER — LOSARTAN POTASSIUM 25 MG PO TABS: 25.0000 mg | ORAL_TABLET | Freq: Every day | ORAL | 11 refills | Status: DC

## 2017-09-19 MED ORDER — DILTIAZEM HCL ER COATED BEADS 120 MG PO CP24
120.0000 mg | ORAL_CAPSULE | Freq: Every day | ORAL | Status: DC
  Administered 2017-09-19: 10:00:00 120 mg via ORAL
  Filled 2017-09-19: qty 1

## 2017-09-19 MED FILL — Gentamicin Sulfate Inj 40 MG/ML: INTRAMUSCULAR | Qty: 80 | Status: AC

## 2017-09-19 NOTE — Op Note (Signed)
NAME:  [name], [name]                     ACCOUNT NO.:  MEDICAL RECORD NO.:  [id number]  LOCATION:                                 FACILITY:  PHYSICIAN:  Deboraha Sprang, MD, [facility]   DATE OF BIRTH:  DATE OF PROCEDURE:  09/18/2017 DATE OF DISCHARGE:                              OPERATIVE REPORT   PREOPERATIVE DIAGNOSES:  Syncope, nonischemic cardiomyopathy, right bundle-branch block, intermittent complete heart block, and permanent atrial fibrillation.  POSTOPERATIVE DIAGNOSES:  Syncope, nonischemic cardiomyopathy, right bundle-branch block, and intermittent complete heart block.  PROCEDURES:  Single-chamber defibrillator implantation with left ventricular lead placement.  There was loop recorder explantation.  Following obtaining informed consent, the patient was brought to Electrophysiology Laboratory and placed on the fluoroscopic table in supine position.  After submission to general anesthesia, routine prep and drape, lidocaine was infiltrated along the prepectoral subclavicular region on the left side.  An incision was made and carried down to layer of the prepectoral fascia using electrocautery and sharp dissection.  A pocket was formed similarly and hemostasis was obtained.  Thereafter, attention was turned to gain access to the extrathoracic left subclavian vein, which was accomplished without difficulty without the aspiration of air or puncture of the artery.  Two separate venipunctures were accomplished.  Guidewires were placed and retained. Sequentially, a 9-French and 9.5-French sheaths were placed through which we passed a Medtronic 6935 62-cm active fixation MRI conditional lead, serial number [id number] and a St. Jude 135CS cannulation sheath.  The right ventricular defibrillator lead was manipulated to the apex, but bipolar R-wave was 5.4 with a pace impedance of 368 ohms and threshold 0.7 V at 0.5 milliseconds.  Current threshold 1.0 mA.  There was no  diaphragmatic pacing at 10 V and the current of injury was brisk. This lead was secured to the prepectoral fascia.  We then obtained access to the coronary sinus.  It was more anteriorly located, perhaps not surprisingly given his atrial fibrillation. Contrast venography demonstrated a mid posterolateral branch with a very sharp takeoff.  This was initially targeted using a Medtronic Attain 290- degree sheath.  I was unable to cannulate it.  We then approached a high anterolateral branch into which we were able to place a Medtronic 4398 88-cm quadripolar lead, serial number [id number].  It was delivered to its most distal location, which was at the junction between the proximal and mid third.  In the LV tip to RV coil configuration, the amplitude was 11 with a pace impedance of 814 and threshold of 1.7 V at 0.5 milliseconds and there was no diaphragmatic pacing at 10 V.  This lead was deployed there with removal of the CS access system.  The leads were secured and then attached to [place] MRI device, serial number [id number].  Through the device, a bipolar R-wave was 5 with a pace impedance of 513 ohms and threshold of 0.5 V at 0.4 milliseconds. The LV impedance was 513 ohms with a threshold of 1.75 V at 1.0 millisecond and the high-voltage impedance was 72 ohms.  With these acceptable parameters recorded, the leads and the pulse generator were placed in the  pocket and secured to the prepectoral fascia both medial and laterally.  Surgicel was placed on the posterior aspect of the device as well as in the cephalad rim of the pocket.  The wound was closed in 2 layers in a normal fashion.  The wound was washed, dried and a Dermabond dressing was applied.  At this juncture, we administered local anesthesia over the loop recorder site.  An incision was made.  The loop recorder was explanted and a Steri-Strips and Dermabond dressing was applied.     Deboraha Sprang, MD,  [facility]     SCK/MEDQ  D:  09/18/2017  T:  09/19/2017  Job:  [id number]

## 2017-09-19 NOTE — Discharge Instructions (Signed)
You will be called by Dr. Olin Pia scheduler to arrange the ablation procedure discussed with you (to be done in about 4 weeks).  If you do not hear from her by mid-week next week, please call the office to schedule.         Supplemental Discharge Instructions for  Pacemaker/Defibrillator Patients  Activity No heavy lifting or vigorous activity with your left/right arm for 6 to 8 weeks.  Do not raise your left/right arm above your head for one week.  Gradually raise your affected arm as drawn below.             09/22/17                     09/23/17                  09/24/17                 09/25/17 __  NO DRIVING until cleared to at your wound check visit  [place] the wound areas clean and dry.  Do not get them wet for 3 days. No showers for 3 days; you may shower on  09/21/17  . - The tape/steri-strips on your wound will fall off; do not pull them off.  No bandage is needed on the site.  DO  NOT apply any creams, oils, or ointments to the wound area. - If you notice any drainage or discharge from the wound, any swelling or bruising at the site, or you develop a fever > 101? F after you are discharged home, call the office at once.  Special Instructions - You are still able to use cellular telephones; use the ear opposite the side where you have your pacemaker/defibrillator.  Avoid carrying your cellular phone near your device. - When traveling through airports, show security personnel your identification card to avoid being screened in the metal detectors.  Ask the security personnel to use the hand wand. - Avoid arc welding equipment, MRI testing (magnetic resonance imaging), TENS units (transcutaneous nerve stimulators).  Call the office for questions about other devices. - Avoid electrical appliances that are in poor condition or are not properly grounded. - Microwave ovens are safe to be near or to operate.  Additional information for defibrillator patients should your  device go off: - If your device goes off ONCE and you feel fine afterward, notify the device clinic nurses. - If your device goes off ONCE and you do not feel well afterward, call 911. - If your device goes off TWICE, call 911. - If your device goes off THREE times in one day, call 911.  DO NOT DRIVE YOURSELF OR A FAMILY MEMBER WITH A DEFIBRILLATOR TO THE HOSPITAL--CALL 911.

## 2017-09-20 DIAGNOSIS — R55 Syncope and collapse: Secondary | ICD-10-CM | POA: Diagnosis not present

## 2017-09-20 DIAGNOSIS — Z9981 Dependence on supplemental oxygen: Secondary | ICD-10-CM | POA: Diagnosis not present

## 2017-09-20 DIAGNOSIS — I428 Other cardiomyopathies: Secondary | ICD-10-CM | POA: Diagnosis not present

## 2017-09-20 DIAGNOSIS — I482 Chronic atrial fibrillation: Secondary | ICD-10-CM | POA: Diagnosis not present

## 2017-09-20 DIAGNOSIS — I4510 Unspecified right bundle-branch block: Secondary | ICD-10-CM | POA: Diagnosis not present

## 2017-09-20 DIAGNOSIS — J449 Chronic obstructive pulmonary disease, unspecified: Secondary | ICD-10-CM | POA: Diagnosis not present

## 2017-09-20 DIAGNOSIS — Z006 Encounter for examination for normal comparison and control in clinical research program: Secondary | ICD-10-CM | POA: Diagnosis not present

## 2017-09-20 DIAGNOSIS — G4733 Obstructive sleep apnea (adult) (pediatric): Secondary | ICD-10-CM | POA: Diagnosis not present

## 2017-09-20 DIAGNOSIS — I252 Old myocardial infarction: Secondary | ICD-10-CM | POA: Diagnosis not present

## 2017-09-21 ENCOUNTER — Other Ambulatory Visit: Payer: Self-pay | Admitting: Family Medicine

## 2017-09-22 ENCOUNTER — Other Ambulatory Visit (HOSPITAL_COMMUNITY): Payer: Self-pay | Admitting: Cardiology

## 2017-09-23 ENCOUNTER — Ambulatory Visit (INDEPENDENT_AMBULATORY_CARE_PROVIDER_SITE_OTHER): Payer: Medicare HMO | Admitting: *Deleted

## 2017-09-23 DIAGNOSIS — R55 Syncope and collapse: Secondary | ICD-10-CM | POA: Diagnosis not present

## 2017-09-23 DIAGNOSIS — J449 Chronic obstructive pulmonary disease, unspecified: Secondary | ICD-10-CM | POA: Diagnosis not present

## 2017-09-23 NOTE — Progress Notes (Signed)
Carelink Summary Report / Loop Recorder 

## 2017-09-24 ENCOUNTER — Telehealth (HOSPITAL_COMMUNITY): Payer: Self-pay | Admitting: Vascular Surgery

## 2017-09-24 ENCOUNTER — Ambulatory Visit: Payer: Medicare HMO

## 2017-09-24 NOTE — Telephone Encounter (Signed)
Returned pt call to make appt w/ [facility]

## 2017-09-24 NOTE — Telephone Encounter (Signed)
Refill denied. Pt has completed course of treatment.  

## 2017-09-26 ENCOUNTER — Ambulatory Visit (HOSPITAL_COMMUNITY)
Admission: RE | Admit: 2017-09-26 | Discharge: 2017-09-26 | Disposition: A | Discharge status: Home / Self Care | Payer: Medicare HMO | Source: Ambulatory Visit | Attending: Cardiology | Admitting: Cardiology

## 2017-09-26 ENCOUNTER — Encounter: Payer: Self-pay | Admitting: Internal Medicine

## 2017-09-26 ENCOUNTER — Ambulatory Visit: Payer: Medicare HMO | Admitting: Internal Medicine

## 2017-09-26 ENCOUNTER — Other Ambulatory Visit (HOSPITAL_COMMUNITY): Payer: Self-pay

## 2017-09-26 ENCOUNTER — Other Ambulatory Visit: Payer: Self-pay | Admitting: Internal Medicine

## 2017-09-26 ENCOUNTER — Other Ambulatory Visit (INDEPENDENT_AMBULATORY_CARE_PROVIDER_SITE_OTHER): Payer: Medicare HMO

## 2017-09-26 VITALS — BP 112/68 | HR 62 | Temp 97.6°F | Ht 70.0 in | Wt 263.0 lb

## 2017-09-26 VITALS — BP 123/59 | HR 69 | Wt 265.4 lb

## 2017-09-26 DIAGNOSIS — I2510 Atherosclerotic heart disease of native coronary artery without angina pectoris: Secondary | ICD-10-CM | POA: Insufficient documentation

## 2017-09-26 DIAGNOSIS — R972 Elevated prostate specific antigen [PSA]: Secondary | ICD-10-CM

## 2017-09-26 DIAGNOSIS — Z9981 Dependence on supplemental oxygen: Secondary | ICD-10-CM | POA: Diagnosis not present

## 2017-09-26 DIAGNOSIS — I482 Chronic atrial fibrillation: Secondary | ICD-10-CM | POA: Diagnosis not present

## 2017-09-26 DIAGNOSIS — E7849 Other hyperlipidemia: Secondary | ICD-10-CM

## 2017-09-26 DIAGNOSIS — L308 Other specified dermatitis: Secondary | ICD-10-CM | POA: Diagnosis not present

## 2017-09-26 DIAGNOSIS — G4733 Obstructive sleep apnea (adult) (pediatric): Secondary | ICD-10-CM | POA: Insufficient documentation

## 2017-09-26 DIAGNOSIS — M791 Myalgia, unspecified site: Secondary | ICD-10-CM | POA: Diagnosis not present

## 2017-09-26 DIAGNOSIS — K219 Gastro-esophageal reflux disease without esophagitis: Secondary | ICD-10-CM | POA: Diagnosis not present

## 2017-09-26 DIAGNOSIS — R05 Cough: Secondary | ICD-10-CM

## 2017-09-26 DIAGNOSIS — E785 Hyperlipidemia, unspecified: Secondary | ICD-10-CM | POA: Diagnosis not present

## 2017-09-26 DIAGNOSIS — J449 Chronic obstructive pulmonary disease, unspecified: Secondary | ICD-10-CM | POA: Insufficient documentation

## 2017-09-26 DIAGNOSIS — R059 Cough, unspecified: Secondary | ICD-10-CM

## 2017-09-26 DIAGNOSIS — I481 Persistent atrial fibrillation: Secondary | ICD-10-CM | POA: Diagnosis not present

## 2017-09-26 DIAGNOSIS — E119 Type 2 diabetes mellitus without complications: Secondary | ICD-10-CM

## 2017-09-26 DIAGNOSIS — Z95810 Presence of automatic (implantable) cardiac defibrillator: Secondary | ICD-10-CM | POA: Diagnosis not present

## 2017-09-26 DIAGNOSIS — Z87891 Personal history of nicotine dependence: Secondary | ICD-10-CM | POA: Insufficient documentation

## 2017-09-26 DIAGNOSIS — I5022 Chronic systolic (congestive) heart failure: Secondary | ICD-10-CM

## 2017-09-26 DIAGNOSIS — I442 Atrioventricular block, complete: Secondary | ICD-10-CM | POA: Diagnosis not present

## 2017-09-26 DIAGNOSIS — I10 Essential (primary) hypertension: Secondary | ICD-10-CM

## 2017-09-26 DIAGNOSIS — I110 Hypertensive heart disease with heart failure: Secondary | ICD-10-CM | POA: Diagnosis not present

## 2017-09-26 DIAGNOSIS — I4819 Other persistent atrial fibrillation: Secondary | ICD-10-CM

## 2017-09-26 DIAGNOSIS — Z79899 Other long term (current) drug therapy: Secondary | ICD-10-CM | POA: Insufficient documentation

## 2017-09-26 DIAGNOSIS — Z6838 Body mass index (BMI) 38.0-38.9, adult: Secondary | ICD-10-CM | POA: Insufficient documentation

## 2017-09-26 DIAGNOSIS — Z7901 Long term (current) use of anticoagulants: Secondary | ICD-10-CM | POA: Diagnosis not present

## 2017-09-26 DIAGNOSIS — N400 Enlarged prostate without lower urinary tract symptoms: Secondary | ICD-10-CM | POA: Insufficient documentation

## 2017-09-26 LAB — HEMOGLOBIN A1C: Hgb A1c MFr Bld: 5.5 % (ref 4.6–6.5)

## 2017-09-26 LAB — LIPID PANEL
CHOL/HDL RATIO: 5
Cholesterol: 168 mg/dL (ref 0–200)
HDL: 36.6 mg/dL — AB (ref >39.00)
NonHDL: 130.97
Triglycerides: 214 mg/dL — ABNORMAL HIGH (ref 0.0–149.0)
VLDL: 42.8 mg/dL — AB (ref 0.0–40.0)

## 2017-09-26 LAB — PSA: PSA: 2.62 ng/mL (ref 0.10–4.00)

## 2017-09-26 LAB — LDL CHOLESTEROL, DIRECT: Direct LDL: 115 mg/dL

## 2017-09-26 MED ORDER — TORSEMIDE 20 MG PO TABS: 60.0000 mg | ORAL_TABLET | Freq: Two times a day (BID) | ORAL | 3 refills | Status: DC

## 2017-09-26 MED ORDER — ZOLPIDEM TARTRATE 10 MG PO TABS: 10.0000 mg | ORAL_TABLET | Freq: Every day | ORAL | 1 refills | Status: DC

## 2017-09-26 MED ORDER — SPIRONOLACTONE 25 MG PO TABS: 25.0000 mg | ORAL_TABLET | Freq: Every day | ORAL | 3 refills | Status: DC

## 2017-09-26 MED ORDER — PREDNISONE 10 MG PO TABS: ORAL_TABLET | ORAL | 0 refills | Status: DC

## 2017-09-26 MED ORDER — EZETIMIBE 10 MG PO TABS: 10.0000 mg | ORAL_TABLET | Freq: Every day | ORAL | 3 refills | Status: DC

## 2017-09-26 NOTE — Progress Notes (Signed)
PCP: Dr. [name] EP: [name] HF Cardiology: [name]  61 yo with history of COPD on home oxygen, permanent atrial fibrillation, and chronic systolic CHF was referred by Dr. [name] for evaluation of CHF.  Amiodarone and DCCV were tried for atrial fibrillation in the past without success.   Patient has been noted to have a low EF since 2013.  EF improved by to normal by 2015 but dropped to 30-35% by 11/16.  Cath in 6/17 showed some CAD but not enough to cause cardiomyopathy.  He was admitted in 3/18 with PNA, atrial fibrillation/RVR, and acute on chronic systolic CHF.  Echo in 3/18 showed fall in EF to 10-15%.   I took him for right and left heart cath in 4/18, which showed nonobstructive CAD and relatively optimized filling pressures.    Syncope in 11/18. Loop recorder showed 6 second pause and periods of complete heart block. He had Medtronic BiV ICD placed in 12/18.   Patient is seen today in followup of CHF/dyspnea. He is using oxygen only at night now for the most part.  No chest pain.  He has been sleeping in a recliner long-term.  Has OSA but cannot tolerate CPAP.  He reports no dyspnea walking on flat ground.  He is short of breath walking up an incline.  No palpitations.  SBP usually in 90s with occasional lightheadedness. BP today is considerably higher than normal for him. Weight is up today by about 9 lbs compared to prior appointment.   Labs (4/18): pro-BNP 904, K 3.1, creatinine 1.39 => 1.08 with BUN 66 => 37, hgb 14.1.  Labs (5/18): hemoglobin 13.1 Labs (6/18): K 4, creatinine 1.3, BNP 294 Labs (7/18): K 3.8, creatinine 1.23, LDL 96, HDL 42 Labs (8/18): K 4.5, creatinine 1.22 Labs (10/18): digoxin 0.4 Labs (12/18): LDL 115, HDL 37, TGs 214, K 4.2, creatinine 0.84  PMH: 1. Asthma 2. COPD: On home oxygen. Prior smoker.  3. Atrial fibrillation: Permanent.  He failed DCCV and amiodarone in the past . 4. HTN 5. GERD 6. Hyperlipidemia 7. BPH.  8. Chronic systolic CHF: Nonischemic  cardiomyopathy.  - EF 25% by TEE in 7/13.  Normalized on 2015 echo. - Echo (11/16): EF 30-35%.  - LHC/RHC (6/17): 80% ostial stenosis small D1, 30-40% pLAD.  Mean RA 4, PA 26/10, mean PCWP 11, CI 2.33.  - Echo (3/18): EF 10-15%, diffuse hypokinesis, severe LV dilation, moderate RV dilation with severely decreased systolic function.  - LHC/RHC (4/18): 40% proximal LAD.  Mean RA 8, PA 37/10 mean 22, mean PCWP 22, LVEDP 9, CI 2.39. - Medtronic BiV ICD placement in 12/18.  9. Morbid obesity 10. ILR in place.  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 Complete heart block: s/p Medtronic CRT-D.   Social History   Socioeconomic History  . Marital status: Divorced    Spouse name: Not on file  . Number of children: 2  . Years of education: Not on file  . Highest education level: Not on file  Social Needs  . Financial resource strain: Not on file  . Food insecurity - worry: Not on file  . Food insecurity - inability: Not on file  . Transportation needs - medical: Not on file  . Transportation needs - non-medical: Not on file  Occupational History  . Occupation: retired/disabled. prev worked in Therapist, sports.    Employer: DISABLED  Tobacco Use  . Smoking status: Former Smoker    Packs/day: 2.00    Years: 30.00    Pack years:  60.00    Types: Cigarettes    Last attempt to quit: 10/16/2007    Years since quitting: 9.9  . Smokeless tobacco: Never Used  Substance and Sexual Activity  . Alcohol use: No  . Drug use: No  . Sexual activity: Not Currently  Other Topics Concern  . Not on file  Social History Narrative   Lives alone.   Family History  Problem Relation Age of Onset  . COPD Mother   . Asthma Mother   . Colon polyps Mother   . Allergies Mother   . Hypothyroidism Mother   . Asthma Maternal Grandmother   . Colon cancer Neg Hx    ROS: All systems reviewed and negative except as per HPI.   Current Outpatient Medications  Medication Sig Dispense Refill  . acetaminophen  (TYLENOL) 500 MG tablet Take 500 mg by mouth every 6 (six) hours as needed.    [name] albuterol (VENTOLIN HFA) 108 (90 Base) MCG/ACT inhaler INHALE 2 PUFFS 4 TIMES A DAY as needed (Patient taking differently: Inhale 2 puffs into the lungs every 6 (six) hours as needed for wheezing or shortness of breath. INHALE 2 PUFFS 4 TIMES A DAY as needed) 54 Inhaler 3  . ALPRAZolam (XANAX) 0.5 MG tablet TAKE 1 TO 2 TABLETS BY MOUTH EVERY DAY AT BEDTIME AS NEEDED FOR SLEEP (Patient taking differently: TAKE 1mg   BY MOUTH EVERY DAY AT BEDTIME) 180 tablet 1  . carisoprodol (SOMA) 350 MG tablet TAKE 1 TABLET BY MOUTH 3 TIMES A DAY AS NEEDED FOR MUSCLE SPASM (Patient taking differently: TAKE 350 mg BY MOUTH 3 TIMES A DAY AS NEEDED FOR MUSCLE SPASM) 90 tablet 5  . carvedilol (COREG) 6.25 MG tablet Take 1.5 tablets (9.375 mg total) by mouth 2 (two) times daily with a meal. 90 tablet 3  . clobetasol ointment (TEMOVATE) 9.81 % Apply 1 application topically 2 (two) times daily. 60 g 1  . digoxin (LANOXIN) 0.25 MG tablet Take 0.5 tablets (0.125 mg total) by mouth daily. 30 tablet 11  . doxepin (SINEQUAN) 10 MG capsule Take 2 capsules (20 mg total) by mouth at bedtime. 180 capsule 0  . ezetimibe (ZETIA) 10 MG tablet Take 1 tablet (10 mg total) by mouth daily. 90 tablet 3  . fluticasone (FLONASE) 50 MCG/ACT nasal spray Place 2 sprays into both nostrils daily as needed for allergies or rhinitis. 18.2 g 5  . Fluticasone-Salmeterol (ADVAIR DISKUS) 250-50 MCG/DOSE AEPB Inhale 1 puff into the lungs 2 (two) times daily. (Patient taking differently: Inhale 1 puff into the lungs 2 (two) times daily as needed (shortness of breath). ) 180 each 3  . linaclotide (LINZESS) 145 MCG CAPS capsule Take 1 capsule (145 mcg total) by mouth every other day. 30 capsule 11  . losartan (COZAAR) 25 MG tablet Take 1 tablet (25 mg total) by mouth daily. 30 tablet 11  . omeprazole (PRILOSEC) 20 MG capsule Take 1 capsule (20 mg total) by mouth daily with supper.  90 capsule 1  . potassium chloride SA (K-DUR,KLOR-CON) 20 MEQ tablet Take 2 tablets (40 mEq total) by mouth daily. 60 tablet 3  . predniSONE (DELTASONE) 10 MG tablet 3 tabs by mouth per day for 3 days,2tabs per day for 3 days,1tab per day for 3 days 18 tablet 0  . sertraline (ZOLOFT) 100 MG tablet TAKE 2 TABLETS BY MOUTH AT BEDTIME 180 tablet 3  . spironolactone (ALDACTONE) 25 MG tablet Take 1 tablet (25 mg total) by mouth daily. 30 tablet  3  . torsemide (DEMADEX) 20 MG tablet Take 3 tablets (60 mg total) by mouth 2 (two) times daily. Take 60 mg (3 Tablets) in the AM and 40 mg (2 Tablets) in the PM. 180 tablet 3  . traMADol (ULTRAM) 50 MG tablet TAKE 1 TABLET BY MOUTH EVERY 6 HOURS AS NEEDED (Patient taking differently: TAKE 50 mg  BY MOUTH EVERY 6 HOURS AS NEEDED for pain) 30 tablet 2  . traZODone (DESYREL) 100 MG tablet Take 0.5 tablets (50 mg total) by mouth at bedtime as needed. 45 tablet 1  . TUMS 500 MG chewable tablet Chew 500-2,000 mg by mouth every 4 (four) hours as needed for indigestion or heartburn.     . valACYclovir (VALTREX) 1000 MG tablet Take 1 tablet (1,000 mg total) by mouth 3 (three) times daily. (Patient taking differently: Take 1,000 mg by mouth 3 (three) times daily as needed (cold sores). ) 21 tablet 2  . venlafaxine XR (EFFEXOR-XR) 37.5 MG 24 hr capsule TAKE ONE CAPSULE BY MOUTH EVERY DAY WITH BREAKFAST 90 capsule 1  . Vitamin D, Ergocalciferol, (DRISDOL) 50000 units CAPS capsule Take 1 capsule (50,000 Units total) by mouth every 7 (seven) days. 12 capsule 0  . warfarin (COUMADIN) 5 MG tablet Take as directed by anticoagulation clinic (Patient taking differently: Take 2.5-5 mg by mouth See admin instructions. Take 2.5 mg on Mon / Fri  Take 5 mg on Sun / Tues / Wed/ Thurs/ Sat) 30 tablet 3  . zolpidem (AMBIEN) 10 MG tablet Take 1 tablet (10 mg total) by mouth at bedtime. 90 tablet 1   No current facility-administered medications for this encounter.    BP (!) 123/59   Pulse  69   Wt 265 lb 6.4 oz (120.4 kg)   SpO2 100%   BMI 38.08 kg/m  General: NAD Neck: JVP 8-9 cm with HJR, no thyromegaly or thyroid nodule.  Lungs: Clear to auscultation bilaterally with normal respiratory effort. CV: Lateral PMI.  Heart regular S1/S2, no S3/S4, no murmur.  1+ edema 1/2 to knees bilaterally.  No carotid bruit.  Normal pedal pulses.  Abdomen: Soft, nontender, no hepatosplenomegaly, no distention.  Skin: Intact without lesions or rashes.  Neurologic: Alert and oriented x 3.  Psych: Normal affect. Extremities: No clubbing or cyanosis.  HEENT: Normal.   Assessment/Plan:  1. Atrial fibrillation: Permanent, good rate control.   - Continue Coreg and warfarin.  - Now has BiV device, planned for AV nodal ablation in the future to promote BiV pacing.  2. COPD: No longer smoking.  He is on home oxygen at night.   3. Chronic systolic CHF: Nonischemic cardiomyopathy based on 6/17 cath. However, he had a recent significant fall in EF from 30-35% to 10-15% on last echo from 3/18.  LHC/RHC in 4/18 showed nonobstructive CAD and relatively optimized filling pressures.  He now has Medtronic CRT-D device.  Weight is up, volume overloaded on exam, NYHA class II-III symptoms. - Continue Coreg 9.375 mg bid.  - Increase spironolactone to 25 mg daily. BMET in 10 days.  - Increase torsemide to 60 mg bid with BMET 10 days.      - Continue losartan 25 mg daily. - Continue digoxin 0.125 with digoxin level with next labs.     - Plan for AV nodal ablation to allow improved BiV pacing. 4. CAD: Nonobstructive on 4/18 cath.  No exertional chest pain.  - He is on warfarin so no ASA.  - Myalgias with multiple statins, unable  to tolerate. LDL remain high on Zetia, refer to lipid clinic for [place].  5. Complete heart block: Now has Medtronic CRT-D device.   Loralie Champagne 09/26/2017

## 2017-09-26 NOTE — Assessment & Plan Note (Addendum)
Ok for predpac asd, would like to try to continue to minimize as possible such as one predpac quarterly at most  Note:  Total time for pt hx, exam, review of record with pt in the room, determination of diagnoses and plan for further eval and tx is > 40 min, with over 50% spent in coordination and counseling of patient, including the differential dx, tx, other evaluation and further management of rash, increased psa velocity, HTN, HLD, DM and cough

## 2017-09-26 NOTE — Assessment & Plan Note (Signed)
stable overall by history and exam, recent data reviewed with pt, and pt to continue medical treatment as before,  to f/u any worsening symptoms or concerns, for f/u lab today 

## 2017-09-26 NOTE — Assessment & Plan Note (Signed)
Not much improved with change of ACE to ARB - ok for predpac as above, and should improve if cough more related to post nasal gtt

## 2017-09-26 NOTE — Assessment & Plan Note (Signed)
Lab Results  Component Value Date   PSA 3.29 04/18/2017   PSA 2.59 05/30/2016   PSA 1.93 11/29/2015  ok for f/u today, o/w stable overall by history and exam, recent data reviewed with pt, and pt to continue medical treatment as before,  to f/u any worsening symptoms or concerns

## 2017-09-26 NOTE — Patient Instructions (Signed)
INCREASE Torsemide to 60 mg (3 Tablets) Twice Daily  INCREASE Spironolactone to 25 mg (1 Tablet) Once Daily  You have been referred to the [facility], they will contact you to schedule intial appointment.   Labs in 10 Days   Follow up in 6 months

## 2017-09-26 NOTE — Progress Notes (Signed)
Paramedicine Encounter   Patient ID: [name] , male,   DOB: 11/15/1955,61 y.o.,  MRN: [id number]   Met patient in clinic today with provider [facility].   Pt requested a f/u appointment after pacemaker/defib was placed last week. Pt had several syncopal episodes prior to the procedure.  Pt will return to AV node ablations to help the pacemaker work more effectively.  The area around the pacemaker "looks good, no redness or oozing".  Weight has increase, no sob today. Pt has no sob while walking around or going up the stairs; no increase in pillows.  He still sleep in chair. Pt has some fluid retention.  ** check with lipid specialist about getting the injectable statin for bad cholorestrol readings.  **rx change: Increase spirolactone to whole tablet 25 mg in the evening Increase torsemide 3 pills BID  (Come back in 10 days for lab work/6 weeks f/u)  Pt still have issues with dizziness; yesterday tried to get snow off of his truck.  Time spent with patient 35 mins   , EMT-Paramedic 09/26/2017   ACTION: Next visit planned for jan 2019     

## 2017-09-26 NOTE — Progress Notes (Signed)
Subjective:    Patient ID: [name], male    DOB: 1956/02/06, 61 y.o.   MRN: [id number]  HPI  Here to f/u after recent admit: Admit date: 09/18/2017 Discharge date: 09/18/17, with: Primary Discharge Diagnosis:  1. NICM 2. Permanent AFib     CHA2DS2Vasc is 2, on warfarin 3. Syncope     2/2 sinus pause, conduction system disease Secondary Discharge Diagnosis:  1. COPD, chronic O2 2. Chronic CHF (systolic) 3. HTN 4. LBBB And : Procedures This Admission:  1.  Implantation of a MDT CRT-D on 09/18/17 by Dr [name].   See procedure report for full details 2. explant LOOP 3.  CXR demonstrated no pneumothorax status post device implantation The patient was admitted and underwent implantation of an ICD with details as outlined above. He was monitored on telemetry overnight which demonstrated afib.  Left chest was without hematoma or ecchymosis.  The device was interrogated and found to be functioning normally.  CXR was obtained and demonstrated no pneumothorax status post device implantation  The patient's discharge medications include an ACE-I (losartan) and beta blocker (carvedilol). Due for f/u appt for wound check early Jan, then formal appt mar 7. Also has Card Dr [name] f/u feb 8.   Post hospital overall doing ok, Pt denies chest pain, increased sob or doe, wheezing, orthopnea, PND, increased LE swelling, palpitations, dizziness or syncope. Only major complaint is persistent itching due to unresolved ill defined pruritic rash for which the only effective tx seems to be oral prednisone.  Did have visit last wk with depomedrol and clobetasol started, but just does not seem to help.  Has been to 3 dermaology and 2 allergy physicians.  Wt is overall down from 2017 but has regained some this past few wks. Last predpac was seytp 2018.  Wt Readings from Last 3 Encounters:  09/26/17 263 lb (119.3 kg)  09/19/17 251 lb 5.2 oz (114 kg)  09/17/17 253 lb 6.4 oz (114.9 kg)  Denies urinary symptoms such  as dysuria, frequency, urgency, flank pain, hematuria or n/v, fever, chills.  Still has ongoing difficutly with getting to sleep and staying asleep, asks for ambien refill Past Medical History:  Diagnosis Date  . AICD (automatic cardioverter/defibrillator) present   . Anemia    supposed to be taking Vit B but doesn't  . ANXIETY    takes Xanax nightly  . Arthritis   . Asthma    Albuterol prn and Advair daily;also takes Prednisone daily  . Atrial fibrillation ([place]) 09/06/2015  . Cardiomyopathy [facility])    a. EF 25% TEE July 2013; b. EF normalized 2015;  c. 03/2015 Echo: EF 40-45%, difrf HK, PASP 38 mmHg, Mild MR, sev LAE/RAE.  [name] Chronic constipation    takes OTC stool softener  . COPD (chronic obstructive pulmonary disease) ([place])    "one dr says COPD; one dr says emphysema" (09/18/2017)  . DEPRESSION    takes Zoloft and Doxepin daily  . Diverticulitis   . DYSKINESIA, ESOPHAGUS   . Emphysema of lung [facility])    "one dr says COPD; one dr says emphysema" (09/18/2017)  . Essential hypertension       . FIBROMYALGIA   . GERD (gastroesophageal reflux disease)       . Glaucoma   . HYPERLIPIDEMIA    a. Intolerant to statins.  . INSOMNIA    takes Ambien nightly  . Myocardial infarction [facility])    a. 2012 Myoview notable for prior infarct;  b. 03/2015 Lexiscan CL:  EF 37%, diff HK, small area of inferior infarct from apex to base-->Med Rx.  . Myocardial infarction ([place])   . O2 dependent    "2.5L q hs & prn" (09/18/2017)  . Paroxysmal atrial fibrillation ([facility])    a. CHA2DS2VASc = 3--> takes Coumadin;  b. 03/15/2015 Successful TEE/DCCV;  c. 03/2015 recurrent afib, Amio d/c'd in setting of hyperthyroidism.  . Peripheral neuropathy   . Pneumonia 12/2016  . Rash and other nonspecific skin eruption 04/12/2009   no cause found saw dermatologists x 2 and allergist  . SLEEP APNEA, OBSTRUCTIVE    a. doesn't use CPAP  . Syncope    a. 03/2015 s/p MDT LINQ.  [name] Type II diabetes mellitus ([place])        Past  Surgical History:  Procedure Laterality Date  . ACNE CYST REMOVAL     2 on back   . BIV ICD INSERTION CRT-D N/A 09/18/2017   Procedure: BIV ICD INSERTION CRT-D;  Surgeon: Deboraha Sprang, MD;  Location: [place] CV LAB;  Service: Cardiovascular;  Laterality: N/A;  . CARDIAC CATHETERIZATION N/A 03/21/2016   Procedure: Right/Left Heart Cath and Coronary Angiography;  Surgeon: Larey Dresser, MD;  Location: [place] CV LAB;  Service: Cardiovascular;  Laterality: N/A;  . CARDIOVERSION  04/18/2012   Procedure: CARDIOVERSION;  Surgeon: Fay Records, MD;  Location: [place];  Service: Cardiovascular;  Laterality: N/A;  . CARDIOVERSION  04/25/2012   Procedure: CARDIOVERSION;  Surgeon: Thayer Headings, MD;  Location: [place];  Service: Cardiovascular;  Laterality: N/A;  . CARDIOVERSION  04/25/2012   Procedure: CARDIOVERSION;  Surgeon: Fay Records, MD;  Location: [place];  Service: Cardiovascular;  Laterality: N/A;  . CARDIOVERSION  05/09/2012   Procedure: CARDIOVERSION;  Surgeon: Sherren Mocha, MD;  Location: [place];  Service: Cardiovascular;  Laterality: N/A;  changed from crenshaw to cooper by trish/leone-endo  . CARDIOVERSION N/A 03/15/2015   Procedure: CARDIOVERSION;  Surgeon: Thayer Headings, MD;  Location: [facility] ENDOSCOPY;  Service: Cardiovascular;  Laterality: N/A;  . COLONOSCOPY    . COLONOSCOPY WITH PROPOFOL N/A 10/21/2014   Procedure: COLONOSCOPY WITH PROPOFOL;  Surgeon: Ladene Artist, MD;  Location: [facility] ENDOSCOPY;  Service: Endoscopy;  Laterality: N/A;  . EP IMPLANTABLE DEVICE N/A 04/06/2015   Procedure: Loop Recorder Insertion;  Surgeon: Evans Lance, MD;  Location: [place] CV LAB;  Service: Cardiovascular;  Laterality: N/A;  . ESOPHAGOGASTRODUODENOSCOPY    . JOINT REPLACEMENT    . LOOP RECORDER REMOVAL N/A 09/18/2017   Procedure: LOOP RECORDER REMOVAL;  Surgeon: Deboraha Sprang, MD;  Location: [place] CV LAB;  Service: Cardiovascular;  Laterality: N/A;  . RIGHT/LEFT HEART CATH AND  CORONARY ANGIOGRAPHY N/A 01/28/2017   Procedure: Right/Left Heart Cath and Coronary Angiography;  Surgeon: Larey Dresser, MD;  Location: [place] CV LAB;  Service: Cardiovascular;  Laterality: N/A;  . TEE WITHOUT CARDIOVERSION  04/25/2012   Procedure: TRANSESOPHAGEAL ECHOCARDIOGRAM (TEE);  Surgeon: Thayer Headings, MD;  Location: [place];  Service: Cardiovascular;  Laterality: N/A;  . TEE WITHOUT CARDIOVERSION N/A 03/15/2015   Procedure: TRANSESOPHAGEAL ECHOCARDIOGRAM (TEE);  Surgeon: Thayer Headings, MD;  Location: [place];  Service: Cardiovascular;  Laterality: N/A;  . TONSILLECTOMY AND ADENOIDECTOMY    . TOTAL KNEE ARTHROPLASTY Right 06/15/2014   Procedure: TOTAL KNEE ARTHROPLASTY;  Surgeon: Renette Butters, MD;  Location: [place];  Service: Orthopedics;  Laterality: Right;    reports that he quit smoking about 9 years ago.  His smoking use included cigarettes. He has a 60.00 pack-year smoking history. he has never used smokeless tobacco. He reports that he does not drink alcohol or use drugs. family history includes Allergies in his mother; Asthma in his maternal grandmother and mother; COPD in his mother; Colon polyps in his mother; Hypothyroidism in his mother. Allergies  Allergen Reactions  . Amiodarone Other (See Comments)    hyperthyroidism  . Statins Other (See Comments)    myalgia  . Tape Other (See Comments)    Skin Tears Use Paper Tape Only   Current Outpatient Medications on File Prior to Visit  Medication Sig Dispense Refill  . acetaminophen (TYLENOL) 500 MG tablet Take 500 mg by mouth every 6 (six) hours as needed.    [name] albuterol (VENTOLIN HFA) 108 (90 Base) MCG/ACT inhaler INHALE 2 PUFFS 4 TIMES A DAY as needed (Patient taking differently: Inhale 2 puffs into the lungs every 6 (six) hours as needed for wheezing or shortness of breath. INHALE 2 PUFFS 4 TIMES A DAY as needed) 54 Inhaler 3  . ALPRAZolam (XANAX) 0.5 MG tablet TAKE 1 TO 2 TABLETS BY MOUTH EVERY DAY AT  BEDTIME AS NEEDED FOR SLEEP (Patient taking differently: TAKE 1mg   BY MOUTH EVERY DAY AT BEDTIME) 180 tablet 1  . carisoprodol (SOMA) 350 MG tablet TAKE 1 TABLET BY MOUTH 3 TIMES A DAY AS NEEDED FOR MUSCLE SPASM (Patient taking differently: TAKE 350 mg BY MOUTH 3 TIMES A DAY AS NEEDED FOR MUSCLE SPASM) 90 tablet 5  . carvedilol (COREG) 6.25 MG tablet Take 1.5 tablets (9.375 mg total) by mouth 2 (two) times daily with a meal. 90 tablet 3  . clobetasol ointment (TEMOVATE) 4.74 % Apply 1 application topically 2 (two) times daily. 60 g 1  . digoxin (LANOXIN) 0.25 MG tablet Take 0.5 tablets (0.125 mg total) by mouth daily. 30 tablet 11  . doxepin (SINEQUAN) 10 MG capsule Take 2 capsules (20 mg total) by mouth at bedtime. 180 capsule 0  . fluticasone (FLONASE) 50 MCG/ACT nasal spray Place 2 sprays into both nostrils daily as needed for allergies or rhinitis. 18.2 g 5  . Fluticasone-Salmeterol (ADVAIR DISKUS) 250-50 MCG/DOSE AEPB Inhale 1 puff into the lungs 2 (two) times daily. (Patient taking differently: Inhale 1 puff into the lungs 2 (two) times daily as needed (shortness of breath). ) 180 each 3  . linaclotide (LINZESS) 145 MCG CAPS capsule Take 1 capsule (145 mcg total) by mouth every other day. 30 capsule 11  . losartan (COZAAR) 25 MG tablet Take 1 tablet (25 mg total) by mouth daily. 30 tablet 11  . omeprazole (PRILOSEC) 20 MG capsule Take 1 capsule (20 mg total) by mouth daily with supper. 90 capsule 1  . potassium chloride SA (K-DUR,KLOR-CON) 20 MEQ tablet Take 2 tablets (40 mEq total) by mouth daily. 60 tablet 3  . sertraline (ZOLOFT) 100 MG tablet TAKE 2 TABLETS BY MOUTH AT BEDTIME 180 tablet 3  . spironolactone (ALDACTONE) 25 MG tablet Take 0.5 tablets (12.5 mg total) by mouth at bedtime. 15 tablet 3  . torsemide (DEMADEX) 20 MG tablet Take 60 mg (3 Tablets) in the AM and 40 mg (2 Tablets) in the PM. 150 tablet 3  . traMADol (ULTRAM) 50 MG tablet TAKE 1 TABLET BY MOUTH EVERY 6 HOURS AS NEEDED  (Patient taking differently: TAKE 50 mg  BY MOUTH EVERY 6 HOURS AS NEEDED for pain) 30 tablet 2  . traZODone (DESYREL) 100 MG tablet Take 0.5 tablets (  50 mg total) by mouth at bedtime as needed. 45 tablet 1  . TUMS 500 MG chewable tablet Chew 500-2,000 mg by mouth every 4 (four) hours as needed for indigestion or heartburn.     . valACYclovir (VALTREX) 1000 MG tablet Take 1 tablet (1,000 mg total) by mouth 3 (three) times daily. (Patient taking differently: Take 1,000 mg by mouth 3 (three) times daily as needed (cold sores). ) 21 tablet 2  . venlafaxine XR (EFFEXOR-XR) 37.5 MG 24 hr capsule TAKE ONE CAPSULE BY MOUTH EVERY DAY WITH BREAKFAST 90 capsule 1  . Vitamin D, Ergocalciferol, (DRISDOL) 50000 units CAPS capsule Take 1 capsule (50,000 Units total) by mouth every 7 (seven) days. 12 capsule 0  . warfarin (COUMADIN) 5 MG tablet Take as directed by anticoagulation clinic (Patient taking differently: Take 2.5-5 mg by mouth See admin instructions. Take 2.5 mg on Mon / Fri  Take 5 mg on Sun / Tues / Wed/ Thurs/ Sat) 30 tablet 3  . [DISCONTINUED] atorvastatin (LIPITOR) 20 MG tablet Take 1 tablet (20 mg total) by mouth daily. 30 tablet 11  . [DISCONTINUED] gabapentin (NEURONTIN) 300 MG capsule Take 1 capsule (300 mg total) by mouth 3 (three) times daily. 90 capsule 5   No current facility-administered medications on file prior to visit.    Review of Systems  Constitutional: Negative for other unusual diaphoresis or sweats HENT: Negative for ear discharge or swelling Eyes: Negative for other worsening visual disturbances Respiratory: Negative for stridor or other swelling  Gastrointestinal: Negative for worsening distension or other blood Genitourinary: Negative for retention or other urinary change Musculoskeletal: Negative for other MSK pain or swelling Skin: Negative for color change or other new lesions Neurological: Negative for worsening tremors and other numbness  Psychiatric/Behavioral:  Negative for worsening agitation or other fatigue All other system neg per pt    Objective:   Physical Exam BP 112/68   Pulse 62   Temp 97.6 F (36.4 C) (Oral)   Ht 5\' 10"  (1.778 m)   Wt 263 lb (119.3 kg)   SpO2 97%   BMI 37.74 kg/m  VS noted, not ill appearing Constitutional: Pt appears in NAD HENT: Head: NCAT.  Right Ear: External ear normal.  Left Ear: External ear normal.  Eyes: . Pupils are equal, round, and reactive to light. Conjunctivae and EOM are normal Nose: without d/c or deformity Neck: Neck supple. Gross normal ROM Cardiovascular: Normal rate and regular rhythm.   Pulmonary/Chest: Effort normal and breath sounds without rales or wheezing.  Neurological: Pt is alert. At baseline orientation, motor grossly intact Skin: Skin is warm. + spongiotic lesions rashes multiple to torso and some to extremities, no other new lesions - left upper chest without s/s swelling or redness, has chronic bilat trace LE edema Psychiatric: Pt behavior is normal without agitation  No other exam findings  Lab Results  Component Value Date   WBC 8.8 09/17/2017   HGB 13.2 09/17/2017   HCT 38.7 09/17/2017   PLT 197 09/17/2017   GLUCOSE 95 09/17/2017   CHOL 159 04/18/2017   TRIG 104.0 04/18/2017   HDL 41.80 04/18/2017   LDLDIRECT 111.0 05/30/2016   LDLCALC 96 04/18/2017   ALT 13 05/09/2017   AST 16 05/09/2017   NA 139 09/17/2017   K 4.2 09/17/2017   CL 102 09/17/2017   CREATININE 0.84 09/17/2017   BUN 24 09/17/2017   CO2 28 09/17/2017   TSH 3.28 04/18/2017   PSA 3.29 04/18/2017   INR  1.61 09/19/2017   HGBA1C 5.8 04/18/2017   MICROALBUR 3.0 (H) 04/18/2017      Assessment & Plan:

## 2017-09-26 NOTE — Patient Instructions (Addendum)
Please take all new medication as prescribed - prednisone  Watch for you cough getting better as well if from allergies  OK to change the flonase to nasacort OTC  Please continue all other medications as before, and refills have been done if requested.  Please have the pharmacy call with any other refills you may need.  Please continue your efforts at being more active, low cholesterol diet, and weight control.  Please keep your appointments with your specialists as you may have planned  Please go to the LAB in the Basement (turn left off the elevator) for the tests to be done today  You will be contacted by phone if any changes need to be made immediately.  Otherwise, you will receive a letter about your results with an explanation, but please check with MyChart first.  Please remember to sign up for MyChart if you have not done so, as this will be important to you in the future with finding out test results, communicating by private email, and scheduling acute appointments online when needed.  Please return in 6 months, or sooner if needed, with Lab testing done 3-5 days before

## 2017-09-26 NOTE — Assessment & Plan Note (Signed)
stable overall by history and exam, recent data reviewed with pt, and pt to continue medical treatment as before,  to f/u any worsening symptoms or concerns BP Readings from Last 3 Encounters:  09/26/17 112/68  09/19/17 108/69  09/17/17 102/68

## 2017-09-26 NOTE — Assessment & Plan Note (Signed)
stable overall by history and exam, recent data reviewed with pt, and pt to continue medical treatment as before,  to f/u any worsening symptoms or concerns Lab Results  Component Value Date   LDLCALC 96 04/18/2017

## 2017-09-27 ENCOUNTER — Ambulatory Visit: Payer: Medicare HMO | Admitting: General Practice

## 2017-09-27 DIAGNOSIS — Z7901 Long term (current) use of anticoagulants: Secondary | ICD-10-CM

## 2017-09-27 LAB — POCT INR: INR: 2.8

## 2017-09-27 NOTE — Patient Instructions (Addendum)
Pre visit review using our clinic review tool, if applicable. No additional management support is needed unless otherwise documented below in the visit note.  Take 1 tablet all days except 1/2 tablet on Mondays and Fridays.  Re-check in 4 weeks.

## 2017-09-30 ENCOUNTER — Ambulatory Visit (INDEPENDENT_AMBULATORY_CARE_PROVIDER_SITE_OTHER): Payer: Medicare HMO | Admitting: *Deleted

## 2017-09-30 DIAGNOSIS — I5022 Chronic systolic (congestive) heart failure: Secondary | ICD-10-CM

## 2017-09-30 DIAGNOSIS — Z95810 Presence of automatic (implantable) cardiac defibrillator: Secondary | ICD-10-CM

## 2017-09-30 DIAGNOSIS — I428 Other cardiomyopathies: Secondary | ICD-10-CM | POA: Diagnosis not present

## 2017-09-30 LAB — CUP PACEART REMOTE DEVICE CHECK
Date Time Interrogation Session: 20181210151248
Implantable Lead Implant Date: 20181205
Implantable Lead Location: 753860
MDC IDC LEAD IMPLANT DT: 20181205
MDC IDC LEAD LOCATION: 753858
MDC IDC PG IMPLANT DT: 20160622

## 2017-09-30 LAB — CUP PACEART INCLINIC DEVICE CHECK
Battery Voltage: 3.12 V
Brady Statistic AP VP Percent: 0 %
Brady Statistic AS VP Percent: 0 %
Brady Statistic RA Percent Paced: 0 %
Brady Statistic RV Percent Paced: 9.35 %
Date Time Interrogation Session: 20181217160110
HighPow Impedance: 66 Ohm
Implantable Lead Implant Date: 20181205
Implantable Lead Model: 4398
Lead Channel Impedance Value: 1007 Ohm
Lead Channel Impedance Value: 261.164
Lead Channel Impedance Value: 285.934
Lead Channel Impedance Value: 289.597
Lead Channel Impedance Value: 295.556
Lead Channel Impedance Value: 4047 Ohm
Lead Channel Impedance Value: 513 Ohm
Lead Channel Impedance Value: 532 Ohm
Lead Channel Impedance Value: 760 Ohm
Lead Channel Impedance Value: 931 Ohm
Lead Channel Sensing Intrinsic Amplitude: 6.25 mV
Lead Channel Setting Pacing Amplitude: 3 V
Lead Channel Setting Pacing Pulse Width: 0.4 ms
MDC IDC LEAD IMPLANT DT: 20181205
MDC IDC LEAD LOCATION: 753858
MDC IDC LEAD LOCATION: 753860
MDC IDC MSMT BATTERY REMAINING LONGEVITY: 126 mo
MDC IDC MSMT LEADCHNL LV IMPEDANCE VALUE: 1026 Ohm
MDC IDC MSMT LEADCHNL LV IMPEDANCE VALUE: 1064 Ohm
MDC IDC MSMT LEADCHNL LV IMPEDANCE VALUE: 291.742
MDC IDC MSMT LEADCHNL LV IMPEDANCE VALUE: 646 Ohm
MDC IDC MSMT LEADCHNL LV IMPEDANCE VALUE: 665 Ohm
MDC IDC MSMT LEADCHNL LV IMPEDANCE VALUE: 988 Ohm
MDC IDC MSMT LEADCHNL LV PACING THRESHOLD AMPLITUDE: 1 V
MDC IDC MSMT LEADCHNL LV PACING THRESHOLD PULSEWIDTH: 1 ms
MDC IDC MSMT LEADCHNL RV IMPEDANCE VALUE: 418 Ohm
MDC IDC MSMT LEADCHNL RV IMPEDANCE VALUE: 532 Ohm
MDC IDC MSMT LEADCHNL RV PACING THRESHOLD AMPLITUDE: 0.5 V
MDC IDC MSMT LEADCHNL RV PACING THRESHOLD PULSEWIDTH: 0.4 ms
MDC IDC PG IMPLANT DT: 20181205
MDC IDC SET LEADCHNL LV PACING PULSEWIDTH: 1 ms
MDC IDC SET LEADCHNL RV PACING AMPLITUDE: 3.5 V
MDC IDC SET LEADCHNL RV SENSING SENSITIVITY: 0.3 mV
MDC IDC STAT BRADY AP VS PERCENT: 0 %
MDC IDC STAT BRADY AS VS PERCENT: 0 %

## 2017-09-30 NOTE — Progress Notes (Signed)
Wound check appointment, s/p ILR explant and CRT-D implant. Dermabond removed from CRT-D implant site. Wound without redness or edema. Incision edges approximated, wound well healed. Steri-strips previously removed from ILR explant site by patient. Edges superficially unapproximated, antibiotic ointement and bandaid placed. Patient instructed to wash site with soap and water and reapply ointment/bandaid daily. Patient to call with any signs/symptoms of infection. Wound recheck appointment on 10/10/17.  Normal device function. Thresholds, sensing, and impedances consistent with implant measurements. Device programmed at 3.5V (RV) and 3.0V (LV) for extra safety margin until 3 month visit. Histogram distribution appropriate for patient and level of activity. Permanent AF, +warfarin. BiV pacing 9.3%--planning for AV node ablation per notes. No ventricular arrhythmias noted. Patient educated about wound care, arm mobility, lifting restrictions, shock plan, and Carelink monitor. ROV with SK on 12/19/17.

## 2017-10-02 ENCOUNTER — Other Ambulatory Visit: Payer: Self-pay | Admitting: Internal Medicine

## 2017-10-02 ENCOUNTER — Other Ambulatory Visit: Payer: Self-pay

## 2017-10-02 DIAGNOSIS — I428 Other cardiomyopathies: Secondary | ICD-10-CM

## 2017-10-02 DIAGNOSIS — Z01812 Encounter for preprocedural laboratory examination: Secondary | ICD-10-CM

## 2017-10-02 DIAGNOSIS — I4819 Other persistent atrial fibrillation: Secondary | ICD-10-CM

## 2017-10-02 DIAGNOSIS — I255 Ischemic cardiomyopathy: Secondary | ICD-10-CM

## 2017-10-03 NOTE — Telephone Encounter (Signed)
Done erx 

## 2017-10-09 ENCOUNTER — Ambulatory Visit (HOSPITAL_COMMUNITY)
Admission: RE | Admit: 2017-10-09 | Discharge: 2017-10-09 | Disposition: A | Discharge status: Home / Self Care | Payer: Medicare HMO | Source: Ambulatory Visit | Attending: Internal Medicine | Admitting: Internal Medicine

## 2017-10-09 DIAGNOSIS — I5022 Chronic systolic (congestive) heart failure: Secondary | ICD-10-CM | POA: Insufficient documentation

## 2017-10-09 LAB — BASIC METABOLIC PANEL
ANION GAP: 10 (ref 5–15)
BUN: 21 mg/dL — ABNORMAL HIGH (ref 6–20)
CHLORIDE: 105 mmol/L (ref 101–111)
CO2: 23 mmol/L (ref 22–32)
CREATININE: 0.81 mg/dL (ref 0.61–1.24)
Calcium: 8.6 mg/dL — ABNORMAL LOW (ref 8.9–10.3)
GFR calc non Af Amer: >60 mL/min (ref >60)
Glucose, Bld: 148 mg/dL — ABNORMAL HIGH (ref 65–99)
POTASSIUM: 3.8 mmol/L (ref 3.5–5.1)
Sodium: 138 mmol/L (ref 135–145)

## 2017-10-09 LAB — DIGOXIN LEVEL: Digoxin Level: 0.4 ng/mL — ABNORMAL LOW (ref 0.8–2.0)

## 2017-10-10 ENCOUNTER — Ambulatory Visit (INDEPENDENT_AMBULATORY_CARE_PROVIDER_SITE_OTHER): Payer: Self-pay | Admitting: *Deleted

## 2017-10-10 DIAGNOSIS — I428 Other cardiomyopathies: Secondary | ICD-10-CM

## 2017-10-10 NOTE — Progress Notes (Signed)
Wound re-check, CRT-D incision site well-healed, edges approximated, no redness or edema note. ILR explant site scabbed over.

## 2017-10-11 ENCOUNTER — Telehealth (HOSPITAL_COMMUNITY): Payer: Self-pay

## 2017-10-11 NOTE — Telephone Encounter (Signed)
Pt texted me this morning and said that his daughters dog died today and he will not be able to make our appointment.  I will f/u with him next week.

## 2017-10-12 DIAGNOSIS — G4733 Obstructive sleep apnea (adult) (pediatric): Secondary | ICD-10-CM | POA: Diagnosis not present

## 2017-10-13 ENCOUNTER — Other Ambulatory Visit: Payer: Self-pay | Admitting: Family Medicine

## 2017-10-14 ENCOUNTER — Encounter: Payer: Self-pay | Admitting: Gastroenterology

## 2017-10-18 ENCOUNTER — Other Ambulatory Visit (HOSPITAL_COMMUNITY): Payer: Self-pay

## 2017-10-18 ENCOUNTER — Encounter: Payer: Self-pay | Admitting: Internal Medicine

## 2017-10-18 ENCOUNTER — Ambulatory Visit (INDEPENDENT_AMBULATORY_CARE_PROVIDER_SITE_OTHER): Payer: Medicare HMO | Admitting: Internal Medicine

## 2017-10-18 ENCOUNTER — Ambulatory Visit (INDEPENDENT_AMBULATORY_CARE_PROVIDER_SITE_OTHER): Payer: Medicare HMO | Admitting: General Practice

## 2017-10-18 ENCOUNTER — Ambulatory Visit: Payer: Medicare HMO | Admitting: Internal Medicine

## 2017-10-18 ENCOUNTER — Other Ambulatory Visit: Payer: Medicare HMO | Admitting: *Deleted

## 2017-10-18 VITALS — BP 112/78 | HR 63 | Temp 97.8°F | Ht 70.0 in | Wt 258.0 lb

## 2017-10-18 DIAGNOSIS — R972 Elevated prostate specific antigen [PSA]: Secondary | ICD-10-CM | POA: Diagnosis not present

## 2017-10-18 DIAGNOSIS — I428 Other cardiomyopathies: Secondary | ICD-10-CM | POA: Diagnosis not present

## 2017-10-18 DIAGNOSIS — Z7901 Long term (current) use of anticoagulants: Secondary | ICD-10-CM

## 2017-10-18 DIAGNOSIS — I4819 Other persistent atrial fibrillation: Secondary | ICD-10-CM

## 2017-10-18 DIAGNOSIS — Z01812 Encounter for preprocedural laboratory examination: Secondary | ICD-10-CM

## 2017-10-18 DIAGNOSIS — L308 Other specified dermatitis: Secondary | ICD-10-CM

## 2017-10-18 DIAGNOSIS — I481 Persistent atrial fibrillation: Secondary | ICD-10-CM

## 2017-10-18 DIAGNOSIS — I10 Essential (primary) hypertension: Secondary | ICD-10-CM | POA: Diagnosis not present

## 2017-10-18 DIAGNOSIS — Z0000 Encounter for general adult medical examination without abnormal findings: Secondary | ICD-10-CM | POA: Diagnosis not present

## 2017-10-18 DIAGNOSIS — E7849 Other hyperlipidemia: Secondary | ICD-10-CM | POA: Diagnosis not present

## 2017-10-18 DIAGNOSIS — E119 Type 2 diabetes mellitus without complications: Secondary | ICD-10-CM | POA: Diagnosis not present

## 2017-10-18 LAB — POCT INR: INR: 1

## 2017-10-18 MED ORDER — HYDROXYZINE HCL 10 MG PO TABS: 10.0000 mg | ORAL_TABLET | Freq: Three times a day (TID) | ORAL | 2 refills | Status: DC | PRN

## 2017-10-18 MED ORDER — PREDNISONE 10 MG PO TABS: ORAL_TABLET | ORAL | 0 refills | Status: DC

## 2017-10-18 NOTE — Progress Notes (Signed)
Subjective:    Patient ID: [name], male    DOB: 1956-03-04, 62 y.o.   MRN: [id number]  HPI  Here with return of numerous areas of itchy rash c/w his prior hx of spongiotic dermatitis, has seen 3 dermatologists and 2 allergists and is willing to see derm again.  Topical steroids have been long term ineffective, even worse to the multiple lesions to the mid back he cannot reach to apply.  Has done well in past with several courses intermittent prednisone which seems to be the only thing that works. Topical benadryl no help.  Desparate in asking for repeat steroid tx today.  Pt denies chest pain, increased sob or doe, wheezing, orthopnea, PND, increased LE swelling, palpitations, dizziness or syncope. Tolerating zetia for HLD for< 1 mo Past Medical History:  Diagnosis Date  . AICD (automatic cardioverter/defibrillator) present   . Anemia    supposed to be taking Vit B but doesn't  . ANXIETY    takes Xanax nightly  . Arthritis   . Asthma    Albuterol prn and Advair daily;also takes Prednisone daily  . Atrial fibrillation ([place]) 09/06/2015  . Cardiomyopathy [facility])    a. EF 25% TEE July 2013; b. EF normalized 2015;  c. 03/2015 Echo: EF 40-45%, difrf HK, PASP 38 mmHg, Mild MR, sev LAE/RAE.  [name] Chronic constipation    takes OTC stool softener  . COPD (chronic obstructive pulmonary disease) ([place])    "one dr says COPD; one dr says emphysema" (09/18/2017)  . DEPRESSION    takes Zoloft and Doxepin daily  . Diverticulitis   . DYSKINESIA, ESOPHAGUS   . Emphysema of lung [facility])    "one dr says COPD; one dr says emphysema" (09/18/2017)  . Essential hypertension       . FIBROMYALGIA   . GERD (gastroesophageal reflux disease)       . Glaucoma   . HYPERLIPIDEMIA    a. Intolerant to statins.  . INSOMNIA    takes Ambien nightly  . Myocardial infarction [facility])    a. 2012 Myoview notable for prior infarct;  b. 03/2015 Lexiscan CL: EF 37%, diff HK, small area of inferior infarct from apex to  base-->Med Rx.  . Myocardial infarction ([place])   . O2 dependent    "2.5L q hs & prn" (09/18/2017)  . Paroxysmal atrial fibrillation (H[facility])    a. CHA2DS2VASc = 3--> takes Coumadin;  b. 03/15/2015 Successful TEE/DCCV;  c. 03/2015 recurrent afib, Amio d/c'd in setting of hyperthyroidism.  . Peripheral neuropathy   . Pneumonia 12/2016  . Rash and other nonspecific skin eruption 04/12/2009   no cause found saw dermatologists x 2 and allergist  . SLEEP APNEA, OBSTRUCTIVE    a. doesn't use CPAP  . Syncope    a. 03/2015 s/p MDT LINQ.  [name] Type II diabetes mellitus ([place])        Past Surgical History:  Procedure Laterality Date  . ACNE CYST REMOVAL     2 on back   . BIV ICD INSERTION CRT-D N/A 09/18/2017   Procedure: BIV ICD INSERTION CRT-D;  Surgeon: Deboraha Sprang, MD;  Location: [place] CV LAB;  Service: Cardiovascular;  Laterality: N/A;  . CARDIAC CATHETERIZATION N/A 03/21/2016   Procedure: Right/Left Heart Cath and Coronary Angiography;  Surgeon: Larey Dresser, MD;  Location: [place] CV LAB;  Service: Cardiovascular;  Laterality: N/A;  . CARDIOVERSION  04/18/2012   Procedure: CARDIOVERSION;  Surgeon: Fay Records, MD;  Location: [facility]  OR;  Service: Cardiovascular;  Laterality: N/A;  . CARDIOVERSION  04/25/2012   Procedure: CARDIOVERSION;  Surgeon: Thayer Headings, MD;  Location: [place];  Service: Cardiovascular;  Laterality: N/A;  . CARDIOVERSION  04/25/2012   Procedure: CARDIOVERSION;  Surgeon: Fay Records, MD;  Location: [place];  Service: Cardiovascular;  Laterality: N/A;  . CARDIOVERSION  05/09/2012   Procedure: CARDIOVERSION;  Surgeon: Sherren Mocha, MD;  Location: [place];  Service: Cardiovascular;  Laterality: N/A;  changed from crenshaw to cooper by trish/leone-endo  . CARDIOVERSION N/A 03/15/2015   Procedure: CARDIOVERSION;  Surgeon: Thayer Headings, MD;  Location: [facility] ENDOSCOPY;  Service: Cardiovascular;  Laterality: N/A;  . COLONOSCOPY    . COLONOSCOPY WITH PROPOFOL N/A 10/21/2014    Procedure: COLONOSCOPY WITH PROPOFOL;  Surgeon: Ladene Artist, MD;  Location: [facility] ENDOSCOPY;  Service: Endoscopy;  Laterality: N/A;  . EP IMPLANTABLE DEVICE N/A 04/06/2015   Procedure: Loop Recorder Insertion;  Surgeon: Evans Lance, MD;  Location: [place] CV LAB;  Service: Cardiovascular;  Laterality: N/A;  . ESOPHAGOGASTRODUODENOSCOPY    . JOINT REPLACEMENT    . LOOP RECORDER REMOVAL N/A 09/18/2017   Procedure: LOOP RECORDER REMOVAL;  Surgeon: Deboraha Sprang, MD;  Location: [place] CV LAB;  Service: Cardiovascular;  Laterality: N/A;  . RIGHT/LEFT HEART CATH AND CORONARY ANGIOGRAPHY N/A 01/28/2017   Procedure: Right/Left Heart Cath and Coronary Angiography;  Surgeon: Larey Dresser, MD;  Location: [place] CV LAB;  Service: Cardiovascular;  Laterality: N/A;  . TEE WITHOUT CARDIOVERSION  04/25/2012   Procedure: TRANSESOPHAGEAL ECHOCARDIOGRAM (TEE);  Surgeon: Thayer Headings, MD;  Location: [place];  Service: Cardiovascular;  Laterality: N/A;  . TEE WITHOUT CARDIOVERSION N/A 03/15/2015   Procedure: TRANSESOPHAGEAL ECHOCARDIOGRAM (TEE);  Surgeon: Thayer Headings, MD;  Location: [place];  Service: Cardiovascular;  Laterality: N/A;  . TONSILLECTOMY AND ADENOIDECTOMY    . TOTAL KNEE ARTHROPLASTY Right 06/15/2014   Procedure: TOTAL KNEE ARTHROPLASTY;  Surgeon: Renette Butters, MD;  Location: [place];  Service: Orthopedics;  Laterality: Right;    reports that he quit smoking about 10 years ago. His smoking use included cigarettes. He has a 60.00 pack-year smoking history. he has never used smokeless tobacco. He reports that he does not drink alcohol or use drugs. family history includes Allergies in his mother; Asthma in his maternal grandmother and mother; COPD in his mother; Colon polyps in his mother; Hypothyroidism in his mother. Allergies  Allergen Reactions  . Amiodarone Other (See Comments)    hyperthyroidism  . Statins Other (See Comments)    myalgia  . Tape Other (See  Comments)    Skin Tears Use Paper Tape Only   Current Outpatient Medications on File Prior to Visit  Medication Sig Dispense Refill  . acetaminophen (TYLENOL) 500 MG tablet Take 500 mg by mouth every 6 (six) hours as needed.    [name] albuterol (VENTOLIN HFA) 108 (90 Base) MCG/ACT inhaler INHALE 2 PUFFS 4 TIMES A DAY as needed (Patient taking differently: Inhale 2 puffs into the lungs every 6 (six) hours as needed for wheezing or shortness of breath. INHALE 2 PUFFS 4 TIMES A DAY as needed) 54 Inhaler 3  . ALPRAZolam (XANAX) 0.5 MG tablet TAKE 1 TO 2 TABLETS BY MOUTH EVERY DAY AT BEDTIME AS NEEDED FOR SLEEP (Patient taking differently: TAKE 1mg   BY MOUTH EVERY DAY AT BEDTIME) 180 tablet 1  . carisoprodol (SOMA) 350 MG tablet TAKE 1 TABLET BY MOUTH 3 TIMES A  DAY AS NEEDED FOR MUSCLE SPASM 90 tablet 5  . carvedilol (COREG) 6.25 MG tablet Take 1.5 tablets (9.375 mg total) by mouth 2 (two) times daily with a meal. 90 tablet 3  . clobetasol ointment (TEMOVATE) 3.08 % Apply 1 application topically 2 (two) times daily. 60 g 1  . digoxin (LANOXIN) 0.25 MG tablet Take 0.5 tablets (0.125 mg total) by mouth daily. 30 tablet 11  . doxepin (SINEQUAN) 10 MG capsule Take 2 capsules (20 mg total) by mouth at bedtime. 180 capsule 0  . ezetimibe (ZETIA) 10 MG tablet Take 1 tablet (10 mg total) by mouth daily. 90 tablet 3  . fluticasone (FLONASE) 50 MCG/ACT nasal spray Place 2 sprays into both nostrils daily as needed for allergies or rhinitis. 18.2 g 5  . Fluticasone-Salmeterol (ADVAIR DISKUS) 250-50 MCG/DOSE AEPB Inhale 1 puff into the lungs 2 (two) times daily. (Patient taking differently: Inhale 1 puff into the lungs 2 (two) times daily as needed (shortness of breath). ) 180 each 3  . linaclotide (LINZESS) 145 MCG CAPS capsule Take 1 capsule (145 mcg total) by mouth every other day. 30 capsule 11  . losartan (COZAAR) 25 MG tablet Take 1 tablet (25 mg total) by mouth daily. 30 tablet 11  . omeprazole (PRILOSEC) 20 MG  capsule Take 1 capsule (20 mg total) by mouth daily with supper. 90 capsule 1  . potassium chloride SA (K-DUR,KLOR-CON) 20 MEQ tablet Take 2 tablets (40 mEq total) by mouth daily. 60 tablet 3  . sertraline (ZOLOFT) 100 MG tablet TAKE 2 TABLETS BY MOUTH AT BEDTIME 180 tablet 3  . spironolactone (ALDACTONE) 25 MG tablet Take 1 tablet (25 mg total) by mouth daily. 30 tablet 3  . torsemide (DEMADEX) 20 MG tablet Take 3 tablets (60 mg total) by mouth 2 (two) times daily. Take 60 mg (3 Tablets) in the AM and 40 mg (2 Tablets) in the PM. 180 tablet 3  . traMADol (ULTRAM) 50 MG tablet TAKE 1 TABLET BY MOUTH EVERY 6 HOURS AS NEEDED (Patient taking differently: TAKE 50 mg  BY MOUTH EVERY 6 HOURS AS NEEDED for pain) 30 tablet 2  . traZODone (DESYREL) 100 MG tablet Take 0.5 tablets (50 mg total) by mouth at bedtime as needed. 45 tablet 1  . TUMS 500 MG chewable tablet Chew 500-2,000 mg by mouth every 4 (four) hours as needed for indigestion or heartburn.     . valACYclovir (VALTREX) 1000 MG tablet Take 1 tablet (1,000 mg total) by mouth 3 (three) times daily. (Patient taking differently: Take 1,000 mg by mouth 3 (three) times daily as needed (cold sores). ) 21 tablet 2  . venlafaxine XR (EFFEXOR-XR) 37.5 MG 24 hr capsule TAKE ONE CAPSULE BY MOUTH EVERY DAY WITH BREAKFAST 90 capsule 1  . Vitamin D, Ergocalciferol, (DRISDOL) 50000 units CAPS capsule TAKE ONE CAPSULE BY MOUTH EVERY 7 DAYS 12 capsule 0  . warfarin (COUMADIN) 5 MG tablet Take as directed by anticoagulation clinic (Patient taking differently: Take 2.5-5 mg by mouth See admin instructions. Take 2.5 mg on Mon / Fri  Take 5 mg on Sun / Tues / Wed/ Thurs/ Sat) 30 tablet 3  . zolpidem (AMBIEN) 10 MG tablet Take 1 tablet (10 mg total) by mouth at bedtime. 90 tablet 1  . [DISCONTINUED] atorvastatin (LIPITOR) 20 MG tablet Take 1 tablet (20 mg total) by mouth daily. 30 tablet 11  . [DISCONTINUED] gabapentin (NEURONTIN) 300 MG capsule Take 1 capsule (300 mg  total) by mouth  3 (three) times daily. 90 capsule 5   No current facility-administered medications on file prior to visit.     Review of Systems  Constitutional: Negative for other unusual diaphoresis or sweats HENT: Negative for ear discharge or swelling Eyes: Negative for other worsening visual disturbances Respiratory: Negative for stridor or other swelling  Gastrointestinal: Negative for worsening distension or other blood Genitourinary: Negative for retention or other urinary change Musculoskeletal: Negative for other MSK pain or swelling Skin: Negative for color change or other new lesions Neurological: Negative for worsening tremors and other numbness  Psychiatric/Behavioral: Negative for worsening agitation or other fatigue All other system neg per pt    Objective:   Physical Exam BP 112/78   Pulse 63   Temp 97.8 F (36.6 C) (Oral)   Ht 5\' 10"  (1.778 m)   Wt 258 lb (117 kg)   SpO2 98%   BMI 37.02 kg/m  VS noted,  Constitutional: Pt appears in NAD HENT: Head: NCAT.  Right Ear: External ear normal.  Left Ear: External ear normal.  Eyes: . Pupils are equal, round, and reactive to light. Conjunctivae and EOM are normal Nose: without d/c or deformity Neck: Neck supple. Gross normal ROM Cardiovascular: Normal rate and regular rhythm.   Pulmonary/Chest: Effort normal and breath sounds without rales or wheezing.  Neurological: Pt is alert. At baseline orientation, motor grossly intact Skin: Skin is warm. Has numerous area variable size erythem patch rash up to 2 cm in diameter, has trace bilat LE edema Psychiatric: Pt behavior is normal without agitation  No other exam findings    Assessment & Plan:

## 2017-10-18 NOTE — Assessment & Plan Note (Signed)
Ok for atarax, predpac and refer derm per pt request

## 2017-10-18 NOTE — Assessment & Plan Note (Signed)
INR low, for ablation soon jan 11, has been seen per coumadin clinic today

## 2017-10-18 NOTE — Patient Instructions (Addendum)
Pre visit review using our clinic review tool, if applicable. No additional management support is needed unless otherwise documented below in the visit note.  Per Dr. [name], take 1 tablet all days except 1/2 tablet on Mondays and Fridays. Patient to be at hospital at 7:30 for ablation on 1/11.  Re-check 1 week after ablation.

## 2017-10-18 NOTE — Progress Notes (Signed)
Paramedicine Encounter    Patient ID: [name], male    DOB: 11-09-1955, 62 y.o.   MRN: [id number]    Patient Care Team: [name], MD as PCP - General Himmelrich, Bryson Ha, RD (Inactive) as Dietitian  Patient Active Problem List   Diagnosis Date Noted  . Increased prostate specific antigen (PSA) velocity 09/26/2017  . Cough 09/26/2017  . Heart failure ([place]) 09/18/2017  . Long term (current) use of anticoagulants 07/04/2017  . Trigger point of thoracic region 07/04/2017  . Cold sore 05/18/2017  . Right lumbar radiculopathy 05/15/2017  . Cellulitis of right leg 05/09/2017  . Right leg swelling 05/09/2017  . Ingrown nail 04/18/2017  . Glaucoma 04/18/2017  . History of nasal polyposis 02/19/2017  . Chronic rhinitis 02/19/2017  . CAP (community acquired pneumonia) 12/31/2016  . Lobar pneumonia ([place])   . Microhematuria 12/04/2016  . Encounter for well adult exam with abnormal findings 11/29/2015  . Chest pain 09/01/2015  . Cardiomyopathy ([place])   . Type II diabetes mellitus ([place])   . Hyperlipidemia   . Persistent atrial fibrillation ([place]) 03/30/2015  . Syncope 03/30/2015  . Anemia 02/23/2015  . Thyrotoxicosis 02/23/2015  . Hx of adenomatous colonic polyps 10/21/2014  . Hemorrhoid 08/19/2014  . De Quervain's tenosynovitis, right 04/30/2014  . Osteoarthritis of right knee 02/19/2014  . Encounter for therapeutic drug monitoring 11/17/2013  . Thoracic degenerative disc disease 06/17/2013  . Thoracic spondylosis without myelopathy 02/11/2013  . Lumbosacral spondylosis without myelopathy 02/11/2013  . COPD (chronic obstructive pulmonary disease) ([place]) 08/20/2012  . Bundle branch block, right and extreme left anterior fascicular 05/05/2012  . Nonischemic cardiomyopathy ([place]) 05/02/2012  . Anticoagulated on warfarin 03/21/2012  . Spongiotic dermatitis 09/26/2011  . Past myocardial infarction 04/19/2011  . UNSPECIFIED URETHRAL STRICTURE 12/26/2010  . Chronic pain syndrome  05/18/2010  . Obstructive sleep apnea 10/21/2007  . NEPHROLITHIASIS, HX OF 10/21/2007  . Anxiety 06/09/2007  . GERD 06/09/2007  . BENIGN PROSTATIC HYPERTROPHY 06/09/2007  . Morbid obesity ([place]) 06/03/2007  . Depression 06/03/2007  . Essential hypertension 06/03/2007  . INSOMNIA 06/03/2007    Current Outpatient Medications:  .  acetaminophen (TYLENOL) 500 MG tablet, Take 500 mg by mouth every 6 (six) hours as needed., Disp: , Rfl:  .  albuterol (VENTOLIN HFA) 108 (90 Base) MCG/ACT inhaler, INHALE 2 PUFFS 4 TIMES A DAY as needed (Patient taking differently: Inhale 2 puffs into the lungs every 6 (six) hours as needed for wheezing or shortness of breath. INHALE 2 PUFFS 4 TIMES A DAY as needed), Disp: 54 Inhaler, Rfl: 3 .  ALPRAZolam (XANAX) 0.5 MG tablet, TAKE 1 TO 2 TABLETS BY MOUTH EVERY DAY AT BEDTIME AS NEEDED FOR SLEEP (Patient taking differently: TAKE 1mg   BY MOUTH EVERY DAY AT BEDTIME), Disp: 180 tablet, Rfl: 1 .  carisoprodol (SOMA) 350 MG tablet, TAKE 1 TABLET BY MOUTH 3 TIMES A DAY AS NEEDED FOR MUSCLE SPASM, Disp: 90 tablet, Rfl: 5 .  carvedilol (COREG) 6.25 MG tablet, Take 1.5 tablets (9.375 mg total) by mouth 2 (two) times daily with a meal., Disp: 90 tablet, Rfl: 3 .  clobetasol ointment (TEMOVATE) 9.93 %, Apply 1 application topically 2 (two) times daily., Disp: 60 g, Rfl: 1 .  digoxin (LANOXIN) 0.25 MG tablet, Take 0.5 tablets (0.125 mg total) by mouth daily., Disp: 30 tablet, Rfl: 11 .  doxepin (SINEQUAN) 10 MG capsule, Take 2 capsules (20 mg total) by mouth at bedtime., Disp: 180 capsule, Rfl: 0 .  ezetimibe (ZETIA) 10 MG tablet, Take 1 tablet (10 mg total) by mouth daily. (Patient not taking: Reported on 09/30/2017), Disp: 90 tablet, Rfl: 3 .  fluticasone (FLONASE) 50 MCG/ACT nasal spray, Place 2 sprays into both nostrils daily as needed for allergies or rhinitis., Disp: 18.2 g, Rfl: 5 .  Fluticasone-Salmeterol (ADVAIR DISKUS) 250-50 MCG/DOSE AEPB, Inhale 1 puff into the lungs 2  (two) times daily. (Patient taking differently: Inhale 1 puff into the lungs 2 (two) times daily as needed (shortness of breath). ), Disp: 180 each, Rfl: 3 .  linaclotide (LINZESS) 145 MCG CAPS capsule, Take 1 capsule (145 mcg total) by mouth every other day., Disp: 30 capsule, Rfl: 11 .  losartan (COZAAR) 25 MG tablet, Take 1 tablet (25 mg total) by mouth daily., Disp: 30 tablet, Rfl: 11 .  omeprazole (PRILOSEC) 20 MG capsule, Take 1 capsule (20 mg total) by mouth daily with supper., Disp: 90 capsule, Rfl: 1 .  potassium chloride SA (K-DUR,KLOR-CON) 20 MEQ tablet, Take 2 tablets (40 mEq total) by mouth daily., Disp: 60 tablet, Rfl: 3 .  predniSONE (DELTASONE) 10 MG tablet, 3 tabs by mouth per day for 3 days,2tabs per day for 3 days,1tab per day for 3 days, Disp: 18 tablet, Rfl: 0 .  sertraline (ZOLOFT) 100 MG tablet, TAKE 2 TABLETS BY MOUTH AT BEDTIME, Disp: 180 tablet, Rfl: 3 .  spironolactone (ALDACTONE) 25 MG tablet, Take 1 tablet (25 mg total) by mouth daily., Disp: 30 tablet, Rfl: 3 .  torsemide (DEMADEX) 20 MG tablet, Take 3 tablets (60 mg total) by mouth 2 (two) times daily. Take 60 mg (3 Tablets) in the AM and 40 mg (2 Tablets) in the PM., Disp: 180 tablet, Rfl: 3 .  traMADol (ULTRAM) 50 MG tablet, TAKE 1 TABLET BY MOUTH EVERY 6 HOURS AS NEEDED (Patient taking differently: TAKE 50 mg  BY MOUTH EVERY 6 HOURS AS NEEDED for pain), Disp: 30 tablet, Rfl: 2 .  traZODone (DESYREL) 100 MG tablet, Take 0.5 tablets (50 mg total) by mouth at bedtime as needed., Disp: 45 tablet, Rfl: 1 .  TUMS 500 MG chewable tablet, Chew 500-2,000 mg by mouth every 4 (four) hours as needed for indigestion or heartburn. , Disp: , Rfl:  .  valACYclovir (VALTREX) 1000 MG tablet, Take 1 tablet (1,000 mg total) by mouth 3 (three) times daily. (Patient taking differently: Take 1,000 mg by mouth 3 (three) times daily as needed (cold sores). ), Disp: 21 tablet, Rfl: 2 .  venlafaxine XR (EFFEXOR-XR) 37.5 MG 24 hr capsule, TAKE ONE  CAPSULE BY MOUTH EVERY DAY WITH BREAKFAST, Disp: 90 capsule, Rfl: 1 .  Vitamin D, Ergocalciferol, (DRISDOL) 50000 units CAPS capsule, TAKE ONE CAPSULE BY MOUTH EVERY 7 DAYS, Disp: 12 capsule, Rfl: 0 .  warfarin (COUMADIN) 5 MG tablet, Take as directed by anticoagulation clinic (Patient taking differently: Take 2.5-5 mg by mouth See admin instructions. Take 2.5 mg on Mon / Fri  Take 5 mg on Sun / Tues / Wed/ Thurs/ Sat), Disp: 30 tablet, Rfl: 3 .  zolpidem (AMBIEN) 10 MG tablet, Take 1 tablet (10 mg total) by mouth at bedtime., Disp: 90 tablet, Rfl: 1 Allergies  Allergen Reactions  . Amiodarone Other (See Comments)    hyperthyroidism  . Statins Other (See Comments)    myalgia  . Tape Other (See Comments)    Skin Tears Use Paper Tape Only     Social History   Socioeconomic History  . Marital status: Divorced  Spouse name: Not on file  . Number of children: 2  . Years of education: Not on file  . Highest education level: Not on file  Social Needs  . Financial resource strain: Not on file  . Food insecurity - worry: Not on file  . Food insecurity - inability: Not on file  . Transportation needs - medical: Not on file  . Transportation needs - non-medical: Not on file  Occupational History  . Occupation: retired/disabled. prev worked in Therapist, sports.    Employer: DISABLED  Tobacco Use  . Smoking status: Former Smoker    Packs/day: 2.00    Years: 30.00    Pack years: 60.00    Types: Cigarettes    Last attempt to quit: 10/16/2007    Years since quitting: 10.0  . Smokeless tobacco: Never Used  Substance and Sexual Activity  . Alcohol use: No  . Drug use: No  . Sexual activity: Not Currently  Other Topics Concern  . Not on file  Social History Narrative   Lives alone.    Physical Exam  Pulmonary/Chest: No respiratory distress.  Abdominal: He exhibits no distension. There is no tenderness.  Musculoskeletal: He exhibits no edema.  Skin: Skin is warm and dry. He is not  diaphoretic.        Future Appointments  Date Time Provider [place]  10/18/2017  1:40 PM [name], MD [facility]  10/25/2017  1:15 PM [facility]  11/22/2017  2:40 PM Larey Dresser, MD [facility] None  12/19/2017  2:00 PM Deboraha Sprang, MD [facility]  03/27/2018 10:20 AM [name], MD [facility]    ATF pt CAO x4 sitting in the livingroom area c/o itchiness.  He stated that he still hasn't gotten relief since our last visit.  He stated that he cant tell a difference in how he feels now oppose to before the pacemaker was placed.  Pt has an appointment today to see his pcp, he was advised to revisit these complaints with him.  Pt denies sob, chest pain and dizziness.  Pt stated that he is tired of taking so many medications and would like to stop.  He was advised not to stop and continue until advised otherwise by his physician.  rx bottles verified.   BP 96/60 (BP Location: Left Arm, Patient Position: Sitting, Cuff Size: Normal)   Pulse 67   Resp 16   Wt 251 lb 3.2 oz (113.9 kg)   BMI 36.04 kg/m    Silvio Sausedo, EMT Paramedic 10/18/2017    ACTION: Next visit planned for jan 25

## 2017-10-18 NOTE — Patient Instructions (Signed)
Please take all new medication as prescribed - the atarax for itching, and prednisone  Please continue all other medications as before, and refills have been done if requested.  Please have the pharmacy call with any other refills you may need.  Please continue your efforts at being more active, low cholesterol diet, and weight control. Please keep your appointments with your specialists as you may have planned  You will be contacted regarding the referral for: Dermatology  Please return in 6 months, or sooner if needed, with Lab testing done 3-5 days before

## 2017-10-19 LAB — BASIC METABOLIC PANEL
BUN / CREAT RATIO: 22 (ref 10–24)
BUN: 32 mg/dL — AB (ref 8–27)
CALCIUM: 9.5 mg/dL (ref 8.6–10.2)
CO2: 20 mmol/L (ref 20–29)
CREATININE: 1.48 mg/dL — AB (ref 0.76–1.27)
Chloride: 103 mmol/L (ref 96–106)
GFR calc Af Amer: 58 mL/min/{1.73_m2} — ABNORMAL LOW (ref >59)
GFR calc non Af Amer: 50 mL/min/{1.73_m2} — ABNORMAL LOW (ref >59)
Glucose: 84 mg/dL (ref 65–99)
Potassium: 5.5 mmol/L — ABNORMAL HIGH (ref 3.5–5.2)
Sodium: 139 mmol/L (ref 134–144)

## 2017-10-19 LAB — CBC WITH DIFFERENTIAL/PLATELET
Basophils Absolute: 0 10*3/uL (ref 0.0–0.2)
Basos: 0 %
EOS (ABSOLUTE): 0.3 10*3/uL (ref 0.0–0.4)
EOS: 3 %
HEMATOCRIT: 38.3 % (ref 37.5–51.0)
HEMOGLOBIN: 13.1 g/dL (ref 13.0–17.7)
Immature Grans (Abs): 0 10*3/uL (ref 0.0–0.1)
Immature Granulocytes: 0 %
LYMPHS ABS: 1.7 10*3/uL (ref 0.7–3.1)
Lymphs: 23 %
MCH: 34.3 pg — AB (ref 26.6–33.0)
MCHC: 34.2 g/dL (ref 31.5–35.7)
MCV: 100 fL — ABNORMAL HIGH (ref 79–97)
MONOCYTES: 9 %
Monocytes Absolute: 0.6 10*3/uL (ref 0.1–0.9)
NEUTROS ABS: 4.9 10*3/uL (ref 1.4–7.0)
Neutrophils: 65 %
Platelets: 160 10*3/uL (ref 150–379)
RBC: 3.82 x10E6/uL — AB (ref 4.14–5.80)
RDW: 12.8 % (ref 12.3–15.4)
WBC: 7.5 10*3/uL (ref 3.4–10.8)

## 2017-10-19 LAB — PROTIME-INR
INR: 1 (ref 0.8–1.2)
PROTHROMBIN TIME: 10.7 s (ref 9.1–12.0)

## 2017-10-19 NOTE — Assessment & Plan Note (Signed)
Lab Results  Component Value Date   LDLCALC 96 04/18/2017  stable overall by history and exam, recent data reviewed with pt, and pt to continue medical treatment as before,  to f/u any worsening symptoms or concerns

## 2017-10-19 NOTE — Assessment & Plan Note (Signed)
Also for psa with labs,  to f/u any worsening symptoms or concerns 

## 2017-10-19 NOTE — Assessment & Plan Note (Signed)
Lab Results  Component Value Date   HGBA1C 5.5 09/26/2017  stable overall by history and exam, recent data reviewed with pt, and pt to continue medical treatment as before,  to f/u any worsening symptoms or concerns

## 2017-10-19 NOTE — Assessment & Plan Note (Signed)
stable overall by history and exam, recent data reviewed with pt, and pt to continue medical treatment as before,  to f/u any worsening symptoms or concerns BP Readings from Last 3 Encounters:  10/18/17 112/78  10/18/17 96/60  09/26/17 (!) 123/59

## 2017-10-21 ENCOUNTER — Other Ambulatory Visit: Payer: Self-pay | Admitting: *Deleted

## 2017-10-21 DIAGNOSIS — I5022 Chronic systolic (congestive) heart failure: Secondary | ICD-10-CM

## 2017-10-21 MED ORDER — POTASSIUM CHLORIDE CRYS ER 20 MEQ PO TBCR: 20.0000 meq | EXTENDED_RELEASE_TABLET | Freq: Every day | ORAL | 3 refills | Status: DC

## 2017-10-21 MED ORDER — TORSEMIDE 20 MG PO TABS: 40.0000 mg | ORAL_TABLET | Freq: Two times a day (BID) | ORAL | 3 refills | Status: DC

## 2017-10-23 ENCOUNTER — Encounter: Payer: Medicare HMO | Admitting: Internal Medicine

## 2017-10-23 ENCOUNTER — Telehealth (HOSPITAL_COMMUNITY): Payer: Self-pay

## 2017-10-23 NOTE — Telephone Encounter (Signed)
Pt called earlier, stated that he was confused of which potassium pills to stop.  I called the [facility] clinic and Jasmine stated that pt needed to stop the Klor-con and take torsemide 40 BID.  I called pt back and advised the same.

## 2017-10-24 DIAGNOSIS — J449 Chronic obstructive pulmonary disease, unspecified: Secondary | ICD-10-CM | POA: Diagnosis not present

## 2017-10-25 ENCOUNTER — Ambulatory Visit (HOSPITAL_COMMUNITY)
Admission: RE | Admit: 2017-10-25 | Discharge: 2017-10-25 | Disposition: A | Discharge status: Home / Self Care | Payer: Medicare HMO | Source: Ambulatory Visit | Attending: Internal Medicine | Admitting: Internal Medicine

## 2017-10-25 ENCOUNTER — Ambulatory Visit: Payer: Medicare HMO

## 2017-10-25 ENCOUNTER — Encounter (HOSPITAL_COMMUNITY)
Admission: RE | Disposition: A | Discharge status: Home / Self Care | Payer: Self-pay | Source: Ambulatory Visit | Attending: Internal Medicine

## 2017-10-25 DIAGNOSIS — I252 Old myocardial infarction: Secondary | ICD-10-CM | POA: Insufficient documentation

## 2017-10-25 DIAGNOSIS — Z95810 Presence of automatic (implantable) cardiac defibrillator: Secondary | ICD-10-CM | POA: Diagnosis not present

## 2017-10-25 DIAGNOSIS — I480 Paroxysmal atrial fibrillation: Secondary | ICD-10-CM | POA: Insufficient documentation

## 2017-10-25 DIAGNOSIS — F419 Anxiety disorder, unspecified: Secondary | ICD-10-CM | POA: Diagnosis not present

## 2017-10-25 DIAGNOSIS — I255 Ischemic cardiomyopathy: Secondary | ICD-10-CM | POA: Insufficient documentation

## 2017-10-25 DIAGNOSIS — Z87891 Personal history of nicotine dependence: Secondary | ICD-10-CM | POA: Insufficient documentation

## 2017-10-25 DIAGNOSIS — I5022 Chronic systolic (congestive) heart failure: Secondary | ICD-10-CM | POA: Insufficient documentation

## 2017-10-25 DIAGNOSIS — J439 Emphysema, unspecified: Secondary | ICD-10-CM | POA: Insufficient documentation

## 2017-10-25 DIAGNOSIS — Z9981 Dependence on supplemental oxygen: Secondary | ICD-10-CM | POA: Insufficient documentation

## 2017-10-25 DIAGNOSIS — Z96651 Presence of right artificial knee joint: Secondary | ICD-10-CM | POA: Insufficient documentation

## 2017-10-25 DIAGNOSIS — K219 Gastro-esophageal reflux disease without esophagitis: Secondary | ICD-10-CM | POA: Insufficient documentation

## 2017-10-25 DIAGNOSIS — I4510 Unspecified right bundle-branch block: Secondary | ICD-10-CM | POA: Insufficient documentation

## 2017-10-25 DIAGNOSIS — M797 Fibromyalgia: Secondary | ICD-10-CM | POA: Diagnosis not present

## 2017-10-25 DIAGNOSIS — E1142 Type 2 diabetes mellitus with diabetic polyneuropathy: Secondary | ICD-10-CM | POA: Diagnosis not present

## 2017-10-25 DIAGNOSIS — I428 Other cardiomyopathies: Secondary | ICD-10-CM

## 2017-10-25 DIAGNOSIS — R001 Bradycardia, unspecified: Secondary | ICD-10-CM | POA: Diagnosis not present

## 2017-10-25 DIAGNOSIS — I110 Hypertensive heart disease with heart failure: Secondary | ICD-10-CM | POA: Insufficient documentation

## 2017-10-25 DIAGNOSIS — I452 Bifascicular block: Secondary | ICD-10-CM | POA: Diagnosis present

## 2017-10-25 DIAGNOSIS — K5909 Other constipation: Secondary | ICD-10-CM | POA: Insufficient documentation

## 2017-10-25 DIAGNOSIS — I4819 Other persistent atrial fibrillation: Secondary | ICD-10-CM | POA: Diagnosis present

## 2017-10-25 DIAGNOSIS — R55 Syncope and collapse: Secondary | ICD-10-CM | POA: Diagnosis present

## 2017-10-25 DIAGNOSIS — J45909 Unspecified asthma, uncomplicated: Secondary | ICD-10-CM | POA: Diagnosis not present

## 2017-10-25 DIAGNOSIS — E875 Hyperkalemia: Secondary | ICD-10-CM | POA: Insufficient documentation

## 2017-10-25 DIAGNOSIS — Z7951 Long term (current) use of inhaled steroids: Secondary | ICD-10-CM | POA: Insufficient documentation

## 2017-10-25 DIAGNOSIS — I4891 Unspecified atrial fibrillation: Secondary | ICD-10-CM | POA: Diagnosis not present

## 2017-10-25 DIAGNOSIS — G4733 Obstructive sleep apnea (adult) (pediatric): Secondary | ICD-10-CM | POA: Diagnosis not present

## 2017-10-25 DIAGNOSIS — G4700 Insomnia, unspecified: Secondary | ICD-10-CM | POA: Diagnosis not present

## 2017-10-25 DIAGNOSIS — E785 Hyperlipidemia, unspecified: Secondary | ICD-10-CM | POA: Insufficient documentation

## 2017-10-25 DIAGNOSIS — F329 Major depressive disorder, single episode, unspecified: Secondary | ICD-10-CM | POA: Diagnosis not present

## 2017-10-25 DIAGNOSIS — I482 Chronic atrial fibrillation: Secondary | ICD-10-CM | POA: Diagnosis not present

## 2017-10-25 DIAGNOSIS — Z79899 Other long term (current) drug therapy: Secondary | ICD-10-CM | POA: Insufficient documentation

## 2017-10-25 DIAGNOSIS — Z7901 Long term (current) use of anticoagulants: Secondary | ICD-10-CM | POA: Insufficient documentation

## 2017-10-25 HISTORY — PX: AV NODE ABLATION: EP1193

## 2017-10-25 LAB — GLUCOSE, CAPILLARY
GLUCOSE-CAPILLARY: 108 mg/dL — AB (ref 65–99)
Glucose-Capillary: 68 mg/dL (ref 65–99)
Glucose-Capillary: 84 mg/dL (ref 65–99)

## 2017-10-25 LAB — BASIC METABOLIC PANEL
Anion gap: 7 (ref 5–15)
BUN: 21 mg/dL — ABNORMAL HIGH (ref 6–20)
CALCIUM: 9 mg/dL (ref 8.9–10.3)
CO2: 28 mmol/L (ref 22–32)
CREATININE: 0.89 mg/dL (ref 0.61–1.24)
Chloride: 105 mmol/L (ref 101–111)
Glucose, Bld: 85 mg/dL (ref 65–99)
Potassium: 4.2 mmol/L (ref 3.5–5.1)
Sodium: 140 mmol/L (ref 135–145)

## 2017-10-25 LAB — PROTIME-INR
INR: 1.17
PROTHROMBIN TIME: 14.8 s (ref 11.4–15.2)

## 2017-10-25 SURGERY — AV NODE ABLATION

## 2017-10-25 MED ORDER — FENTANYL CITRATE (PF) 100 MCG/2ML IJ SOLN
INTRAMUSCULAR | Status: AC
  Filled 2017-10-25: qty 2

## 2017-10-25 MED ORDER — MIDAZOLAM HCL 5 MG/5ML IJ SOLN
INTRAMUSCULAR | Status: AC
  Filled 2017-10-25: qty 5

## 2017-10-25 MED ORDER — MIDAZOLAM HCL 5 MG/5ML IJ SOLN
INTRAMUSCULAR | Status: DC | PRN
  Administered 2017-10-25 (×3): 1 mg via INTRAVENOUS
  Administered 2017-10-25: 2 mg via INTRAVENOUS
  Administered 2017-10-25 (×3): 1 mg via INTRAVENOUS

## 2017-10-25 MED ORDER — SODIUM CHLORIDE 0.9 % IV SOLN: 250.0000 mL | INTRAVENOUS | Status: DC | PRN

## 2017-10-25 MED ORDER — DIPHENHYDRAMINE HCL 50 MG/ML IJ SOLN
INTRAMUSCULAR | Status: DC | PRN
  Administered 2017-10-25: 25 mg via INTRAVENOUS

## 2017-10-25 MED ORDER — BUPIVACAINE HCL (PF) 0.25 % IJ SOLN
INTRAMUSCULAR | Status: DC | PRN
  Administered 2017-10-25: 30 mL

## 2017-10-25 MED ORDER — SODIUM CHLORIDE 0.9 % IV SOLN
INTRAVENOUS | Status: DC
  Administered 2017-10-25: 09:00:00 via INTRAVENOUS

## 2017-10-25 MED ORDER — FENTANYL CITRATE (PF) 100 MCG/2ML IJ SOLN
INTRAMUSCULAR | Status: DC | PRN
  Administered 2017-10-25 (×7): 25 ug via INTRAVENOUS

## 2017-10-25 MED ORDER — SODIUM CHLORIDE 0.9 % IV SOLN: INTRAVENOUS | Status: AC

## 2017-10-25 MED ORDER — DIPHENHYDRAMINE HCL 50 MG/ML IJ SOLN
INTRAMUSCULAR | Status: AC
  Filled 2017-10-25: qty 1

## 2017-10-25 MED ORDER — SODIUM CHLORIDE 0.9% FLUSH: 3.0000 mL | Freq: Two times a day (BID) | INTRAVENOUS | Status: DC

## 2017-10-25 MED ORDER — HEPARIN (PORCINE) IN NACL 2-0.9 UNIT/ML-% IJ SOLN
INTRAMUSCULAR | Status: AC | PRN
  Administered 2017-10-25: 500 mL

## 2017-10-25 MED ORDER — HEPARIN (PORCINE) IN NACL 2-0.9 UNIT/ML-% IJ SOLN
INTRAMUSCULAR | Status: AC
  Filled 2017-10-25: qty 500

## 2017-10-25 MED ORDER — SODIUM CHLORIDE 0.9% FLUSH: 3.0000 mL | INTRAVENOUS | Status: DC | PRN

## 2017-10-25 SURGICAL SUPPLY — 7 items
BLANKET WARM UNDERBOD FULL ACC (MISCELLANEOUS) IMPLANT
CATH THERMISTOR 8FR 6MM 5086T (ABLATOR) ×1 IMPLANT
INTRODUCER SWARTZ SL2 8F (SHEATH) ×1 IMPLANT
PACK EP LATEX FREE (CUSTOM PROCEDURE TRAY) ×2
PACK EP LF (CUSTOM PROCEDURE TRAY) IMPLANT
PAD DEFIB LIFELINK (PAD) ×1 IMPLANT
SHEATH PINNACLE 8F 10CM (SHEATH) ×1 IMPLANT

## 2017-10-25 NOTE — Progress Notes (Signed)
Patient is seen post procedure  AF/VP on telemetry VSS R groin is soft, non-tender, no bleeding or hematoma Patient feels well, denies any CP, palpitations or SOB Discussed site care and activity instructions Post procedure follow up has been arranged Planned for discharge once bedrest is completed, after ambulation if site and patient remain stable  INR today 1.1, the patient has not been taking his warfarin routinely, discussed the importance of this and OK to resume today his regime, he has f/u with his PMD's coumadin clinic in place  [place], [place]

## 2017-10-25 NOTE — H&P (Addendum)
Patient Care Team: [name], MD as PCP - General Himmelrich, Bryson Ha, RD (Inactive) as Dietitian   HPI  [name] is a 62 y.o. male With permanent  atrial fibrillation  S/p CRT implant admitted today for AV ablation    CRT-D undertaken with reucrrent syncope with documented pause > 6 sec, given his right bundle branch block with a QRS duration of 180 m  and LV dysfunction     DATE TEST     /2012 myoview  EF44 Prior IMI perfsion defect  /2015 myoview   EF57% Perfusion defect--MI vs bowel  2016 myoview 35-40 NO perfusion defect  6/16 Echo 40-45 Severe BAE  6/17 Cath 30 Global Hypokinesis With normal CA And normal pulm   3/18 Echo EF20-25%   4/18 LHC/RHC  No CAD   Antiarrhythmics Date  dofetilide 2013  amidoarone 2016   Date Cr K Hgb  8/18 1.22 4.5 12.2   1/19 1.48 5.5    Still with DOE <150 ft, no edema no chest pain  "ready to get this done"  Past Medical History:  Diagnosis Date  . AICD (automatic cardioverter/defibrillator) present   . Anemia    supposed to be taking Vit B but doesn't  . ANXIETY    takes Xanax nightly  . Arthritis   . Asthma    Albuterol prn and Advair daily;also takes Prednisone daily  . Atrial fibrillation ([place]) 09/06/2015  . Cardiomyopathy [facility])    a. EF 25% TEE July 2013; b. EF normalized 2015;  c. 03/2015 Echo: EF 40-45%, difrf HK, PASP 38 mmHg, Mild MR, sev LAE/RAE.  [name] Chronic constipation    takes OTC stool softener  . COPD (chronic obstructive pulmonary disease) ([place])    "one dr says COPD; one dr says emphysema" (09/18/2017)  . DEPRESSION    takes Zoloft and Doxepin daily  . Diverticulitis   . DYSKINESIA, ESOPHAGUS   . Emphysema of lung [facility])    "one dr says COPD; one dr says emphysema" (09/18/2017)  . Essential hypertension       . FIBROMYALGIA   . GERD (gastroesophageal reflux disease)       . Glaucoma   . HYPERLIPIDEMIA    a. Intolerant to statins.  . INSOMNIA    takes Ambien nightly  .  Myocardial infarction [facility])    a. 2012 Myoview notable for prior infarct;  b. 03/2015 Lexiscan CL: EF 37%, diff HK, small area of inferior infarct from apex to base-->Med Rx.  . Myocardial infarction ([place])   . O2 dependent    "2.5L q hs & prn" (09/18/2017)  . Paroxysmal atrial fibrillation ([facility])    a. CHA2DS2VASc = 3--> takes Coumadin;  b. 03/15/2015 Successful TEE/DCCV;  c. 03/2015 recurrent afib, Amio d/c'd in setting of hyperthyroidism.  . Peripheral neuropathy   . Pneumonia 12/2016  . Rash and other nonspecific skin eruption 04/12/2009   no cause found saw dermatologists x 2 and allergist  . SLEEP APNEA, OBSTRUCTIVE    a. doesn't use CPAP  . Syncope    a. 03/2015 s/p MDT LINQ.  [name] Type II diabetes mellitus ([place])         Past Surgical History:  Procedure Laterality Date  . ACNE CYST REMOVAL     2 on back   . BIV ICD INSERTION CRT-D N/A 09/18/2017   Procedure: BIV ICD INSERTION CRT-D;  Surgeon: Deboraha Sprang, MD;  Location: [place] CV LAB;  Service: Cardiovascular;  Laterality: N/A;  . CARDIAC CATHETERIZATION N/A 03/21/2016   Procedure: Right/Left Heart Cath and Coronary Angiography;  Surgeon: Larey Dresser, MD;  Location: [place] CV LAB;  Service: Cardiovascular;  Laterality: N/A;  . CARDIOVERSION  04/18/2012   Procedure: CARDIOVERSION;  Surgeon: Fay Records, MD;  Location: [place];  Service: Cardiovascular;  Laterality: N/A;  . CARDIOVERSION  04/25/2012   Procedure: CARDIOVERSION;  Surgeon: Thayer Headings, MD;  Location: [place];  Service: Cardiovascular;  Laterality: N/A;  . CARDIOVERSION  04/25/2012   Procedure: CARDIOVERSION;  Surgeon: Fay Records, MD;  Location: [place];  Service: Cardiovascular;  Laterality: N/A;  . CARDIOVERSION  05/09/2012   Procedure: CARDIOVERSION;  Surgeon: Sherren Mocha, MD;  Location: [place];  Service: Cardiovascular;  Laterality: N/A;  changed from crenshaw to cooper by trish/leone-endo  . CARDIOVERSION N/A 03/15/2015   Procedure: CARDIOVERSION;   Surgeon: Thayer Headings, MD;  Location: [facility] ENDOSCOPY;  Service: Cardiovascular;  Laterality: N/A;  . COLONOSCOPY    . COLONOSCOPY WITH PROPOFOL N/A 10/21/2014   Procedure: COLONOSCOPY WITH PROPOFOL;  Surgeon: Ladene Artist, MD;  Location: [facility] ENDOSCOPY;  Service: Endoscopy;  Laterality: N/A;  . EP IMPLANTABLE DEVICE N/A 04/06/2015   Procedure: Loop Recorder Insertion;  Surgeon: Evans Lance, MD;  Location: [place] CV LAB;  Service: Cardiovascular;  Laterality: N/A;  . ESOPHAGOGASTRODUODENOSCOPY    . JOINT REPLACEMENT    . LOOP RECORDER REMOVAL N/A 09/18/2017   Procedure: LOOP RECORDER REMOVAL;  Surgeon: Deboraha Sprang, MD;  Location: [place] CV LAB;  Service: Cardiovascular;  Laterality: N/A;  . RIGHT/LEFT HEART CATH AND CORONARY ANGIOGRAPHY N/A 01/28/2017   Procedure: Right/Left Heart Cath and Coronary Angiography;  Surgeon: Larey Dresser, MD;  Location: [place] CV LAB;  Service: Cardiovascular;  Laterality: N/A;  . TEE WITHOUT CARDIOVERSION  04/25/2012   Procedure: TRANSESOPHAGEAL ECHOCARDIOGRAM (TEE);  Surgeon: Thayer Headings, MD;  Location: [place];  Service: Cardiovascular;  Laterality: N/A;  . TEE WITHOUT CARDIOVERSION N/A 03/15/2015   Procedure: TRANSESOPHAGEAL ECHOCARDIOGRAM (TEE);  Surgeon: Thayer Headings, MD;  Location: [place];  Service: Cardiovascular;  Laterality: N/A;  . TONSILLECTOMY AND ADENOIDECTOMY    . TOTAL KNEE ARTHROPLASTY Right 06/15/2014   Procedure: TOTAL KNEE ARTHROPLASTY;  Surgeon: Renette Butters, MD;  Location: [place];  Service: Orthopedics;  Laterality: Right;    Current Facility-Administered Medications  Medication Dose Route Frequency Provider Last Rate Last Dose  . 0.9 %  sodium chloride infusion   Intravenous Continuous Chanetta Marshall K, NP 50 mL/hr at 10/25/17 1610      Allergies  Allergen Reactions  . Amiodarone Other (See Comments)    hyperthyroidism  . Statins Other (See Comments)    myalgia  . Tape Other (See Comments)     Skin Tears Use Paper Tape Only      Social History   Tobacco Use  . Smoking status: Former Smoker    Packs/day: 2.00    Years: 30.00    Pack years: 60.00    Types: Cigarettes    Last attempt to quit: 10/16/2007    Years since quitting: 10.0  . Smokeless tobacco: Never Used  Substance Use Topics  . Alcohol use: No  . Drug use: No     Family History  Problem Relation Age of Onset  . COPD Mother   . Asthma Mother   . Colon polyps Mother   . Allergies Mother   . Hypothyroidism Mother   .  Asthma Maternal Grandmother   . Colon cancer Neg Hx      Current Meds  Medication Sig  . acetaminophen (TYLENOL) 500 MG tablet Take 500 mg by mouth every 6 (six) hours as needed.  . ALPRAZolam (XANAX) 0.5 MG tablet TAKE 1 TO 2 TABLETS BY MOUTH EVERY DAY AT BEDTIME AS NEEDED FOR SLEEP (Patient taking differently: TAKE 1mg   BY MOUTH EVERY DAY AT BEDTIME)  . carisoprodol (SOMA) 350 MG tablet TAKE 1 TABLET BY MOUTH 3 TIMES A DAY AS NEEDED FOR MUSCLE SPASM  . carvedilol (COREG) 6.25 MG tablet Take 1.5 tablets (9.375 mg total) by mouth 2 (two) times daily with a meal.  . digoxin (LANOXIN) 0.25 MG tablet Take 0.5 tablets (0.125 mg total) by mouth daily.  [name] ezetimibe (ZETIA) 10 MG tablet Take 1 tablet (10 mg total) by mouth daily.  . fluticasone (FLONASE) 50 MCG/ACT nasal spray Place 2 sprays into both nostrils daily as needed for allergies or rhinitis.  . Fluticasone-Salmeterol (ADVAIR DISKUS) 250-50 MCG/DOSE AEPB Inhale 1 puff into the lungs 2 (two) times daily. (Patient taking differently: Inhale 1 puff into the lungs 2 (two) times daily as needed (shortness of breath). )  . hydrOXYzine (ATARAX/VISTARIL) 10 MG tablet Take 1 tablet (10 mg total) by mouth 3 (three) times daily as needed.  . linaclotide (LINZESS) 145 MCG CAPS capsule Take 1 capsule (145 mcg total) by mouth every other day.  . losartan (COZAAR) 25 MG tablet Take 1 tablet (25 mg total) by mouth daily.  [name] omeprazole (PRILOSEC) 20 MG  capsule Take 1 capsule (20 mg total) by mouth daily with supper.  . potassium chloride SA (K-DUR,KLOR-CON) 20 MEQ tablet Take 1 tablet (20 mEq total) by mouth daily.  . predniSONE (DELTASONE) 10 MG tablet 3 tabs by mouth per day for 3 days,2tabs per day for 3 days,1tab per day for 3 days  . sertraline (ZOLOFT) 100 MG tablet TAKE 2 TABLETS BY MOUTH AT BEDTIME  . spironolactone (ALDACTONE) 25 MG tablet Take 1 tablet (25 mg total) by mouth daily.  [name] torsemide (DEMADEX) 20 MG tablet Take 2 tablets (40 mg total) by mouth 2 (two) times daily.  . traMADol (ULTRAM) 50 MG tablet TAKE 1 TABLET BY MOUTH EVERY 6 HOURS AS NEEDED (Patient taking differently: TAKE 50 mg  BY MOUTH EVERY 6 HOURS AS NEEDED for pain)  . traZODone (DESYREL) 100 MG tablet Take 0.5 tablets (50 mg total) by mouth at bedtime as needed.  . TUMS 500 MG chewable tablet Chew 500-2,000 mg by mouth every 4 (four) hours as needed for indigestion or heartburn.   . valACYclovir (VALTREX) 1000 MG tablet Take 1 tablet (1,000 mg total) by mouth 3 (three) times daily. (Patient taking differently: Take 1,000 mg by mouth 3 (three) times daily as needed (cold sores). )  . venlafaxine XR (EFFEXOR-XR) 37.5 MG 24 hr capsule TAKE ONE CAPSULE BY MOUTH EVERY DAY WITH BREAKFAST  . Vitamin D, Ergocalciferol, (DRISDOL) 50000 units CAPS capsule TAKE ONE CAPSULE BY MOUTH EVERY 7 DAYS  . warfarin (COUMADIN) 5 MG tablet Take as directed by anticoagulation clinic (Patient taking differently: Take 2.5-5 mg by mouth See admin instructions. Take 2.5 mg on Mon / Fri  Take 5 mg on Sun / Tues / Wed/ Thurs/ Sat)  . zolpidem (AMBIEN) 10 MG tablet Take 1 tablet (10 mg total) by mouth at bedtime.     Review of Systems negative except from HPI and PMH  Physical Exam BP 115/62  Pulse 67   Temp 97.7 F (36.5 C) (Oral)   Ht 5\' 10"  (1.778 m)   Wt 257 lb (116.6 kg)   SpO2 100%   BMI 36.88 kg/m  Well developed and nourished in no acute distress HENT normal Neck supple  with JVP-flat Carotids brisk and full without bruits Clear Device pocket well healed; without hematoma or erythema.  There is no tethering  Irregularly irregular rate and rhythm with controlled ventricular response, no murmurs or gallops Abd-soft with active BS without hepatomegaly No Clubbing cyanosis edema Skin-warm and dry A & Oriented  Grossly normal sensory and motor function    Assessment and  Plan  Ischemic heart disease without significant obstructive disease by catheterization 6/17  HFrEF chronic Class III  Cardiomyopathy-ischemic/nonischemic  Atrial fibrillation  permanent  RBBB/LAD  Bradycardia  Syncope   Hyperkalemia   CRT-D  [employer]   Admitted for AV ablation given AFib     Reviewed risks and benefits   Functional status is stable  With OSA will use anesthesia support for procedure     Unfortunately Anesthesia was not scheduled  Will do with CPAP support and minimal sedation

## 2017-10-25 NOTE — Progress Notes (Signed)
Report received from [facility].  Care assumed at this time.

## 2017-10-25 NOTE — Progress Notes (Signed)
Site area: RFV Site Prior to Removal:  Level 0 Pressure Applied For:10 min Manual:   yes Patient Status During Pull: stable  Post Pull Site:  Level 0 Post Pull Instructions Given:  yes Post Pull Pulses Present: palpable PT Dressing Applied:  tegaderm Bedrest begins @ 1140  Comments:

## 2017-10-25 NOTE — Discharge Instructions (Signed)
Post ablation care/activity instructions No driving for 4 days. No lifting over 5 lbs for 1 week. No vigorous or sexual activity for 1 week. You may return to work in one week. Keep procedure site clean & dry. If you notice increased pain, swelling, bleeding or pus, call/return!  You may shower, but no soaking baths/hot tubs/pools for 1 week.   Cardiac Ablation Cardiac ablation is a procedure to stop some heart tissue from causing problems. The heart has many electrical connections. Sometimes these connections make the heart beat very fast or irregularly. Removing some problem areas can improve the heart rhythm or make it normal. What happens before the procedure?  Follow instructions from your doctor about what you cannot eat or drink.  Ask your doctor about: ? Changing or stopping your normal medicines. This is important if you take diabetes medicines or blood thinners. ? Taking medicines such as aspirin and ibuprofen. These medicines can thin your blood. Do not take these medicines before your procedure if your doctor tells you not to.  Plan to have someone take you home.  If you will be going home right after the procedure, plan to have someone with you for 24 hours. What happens during the procedure?  To lower your risk of infection: ? Your health care team will wash or sanitize their hands. ? Your skin will be washed with soap. ? Hair may be removed from your neck or groin.  An IV tube will be put into one of your veins.  You will be given a medicine to help you relax (sedative).  Skin on your neck or groin will be numbed.  A cut (incision) will be made in your neck or groin.  A needle will be put through your cut and into a vein in your neck or groin.  A tube (catheter) will be put into the needle. The tube will be moved to your heart. X-rays (fluoroscopy) will be used to help guide the tube.  Small devices (electrodes) on the tip of the tube will send out electrical  currents.  Dye may be put through the tube. This helps your surgeon see your heart.  Electrical energy will be used to scar (ablate) some heart tissue. Your surgeon may use: ? Heat (radiofrequency energy). ? Laser energy. ? Extreme cold (cryoablation).  The tube will be taken out.  Pressure will be held on your cut. This helps stop bleeding.  A bandage (dressing) will be put on your cut. The procedure may vary. What happens after the procedure?  You will be monitored until your medicines have worn off.  Your cut will be watched for bleeding. You will need to lie still for a few hours.  Do not drive for 24 hours or as long as your doctor tells you. Summary  Cardiac ablation is a procedure to stop some heart tissue from causing problems.  Electrical energy will be used to scar (ablate) some heart tissue. This information is not intended to replace advice given to you by your health care provider. Make sure you discuss any questions you have with your health care provider. Document Released: 06/03/2013 Document Revised: 08/20/2016 Document Reviewed: 08/20/2016 Elsevier Interactive Patient Education  2017 [employer].

## 2017-10-26 ENCOUNTER — Encounter (HOSPITAL_COMMUNITY): Payer: Self-pay | Admitting: Internal Medicine

## 2017-10-30 ENCOUNTER — Other Ambulatory Visit: Payer: Medicare HMO

## 2017-10-30 DIAGNOSIS — L308 Other specified dermatitis: Secondary | ICD-10-CM | POA: Diagnosis not present

## 2017-11-01 ENCOUNTER — Ambulatory Visit (INDEPENDENT_AMBULATORY_CARE_PROVIDER_SITE_OTHER): Payer: Medicare HMO | Admitting: General Practice

## 2017-11-01 ENCOUNTER — Other Ambulatory Visit: Payer: Medicare HMO | Admitting: *Deleted

## 2017-11-01 DIAGNOSIS — I5022 Chronic systolic (congestive) heart failure: Secondary | ICD-10-CM | POA: Diagnosis not present

## 2017-11-01 DIAGNOSIS — Z7901 Long term (current) use of anticoagulants: Secondary | ICD-10-CM | POA: Diagnosis not present

## 2017-11-01 LAB — POCT INR: INR: 1.3

## 2017-11-01 NOTE — Patient Instructions (Addendum)
Pre visit review using our clinic review tool, if applicable. No additional management support is needed unless otherwise documented below in the visit note.  Take 1 1/2 tablets today and tomorrow (1/18) and (1/19) and then start taking 1 tablet daily.  Re-check in 7 to 10 days.

## 2017-11-02 LAB — BASIC METABOLIC PANEL
BUN / CREAT RATIO: 24 (ref 10–24)
BUN: 18 mg/dL (ref 8–27)
CHLORIDE: 97 mmol/L (ref 96–106)
CO2: 26 mmol/L (ref 20–29)
Calcium: 9.2 mg/dL (ref 8.6–10.2)
Creatinine, Ser: 0.75 mg/dL — ABNORMAL LOW (ref 0.76–1.27)
GFR calc Af Amer: 114 mL/min/{1.73_m2} (ref >59)
GFR calc non Af Amer: 98 mL/min/{1.73_m2} (ref >59)
GLUCOSE: 81 mg/dL (ref 65–99)
Potassium: 4.4 mmol/L (ref 3.5–5.2)
Sodium: 139 mmol/L (ref 134–144)

## 2017-11-02 LAB — PRO B NATRIURETIC PEPTIDE: NT-PRO BNP: 433 pg/mL — AB (ref 0–210)

## 2017-11-07 ENCOUNTER — Ambulatory Visit (INDEPENDENT_AMBULATORY_CARE_PROVIDER_SITE_OTHER): Payer: Self-pay | Admitting: *Deleted

## 2017-11-07 DIAGNOSIS — I428 Other cardiomyopathies: Secondary | ICD-10-CM

## 2017-11-07 LAB — CUP PACEART INCLINIC DEVICE CHECK
Battery Voltage: 3.04 V
Brady Statistic AP VP Percent: 0 %
Brady Statistic AP VS Percent: 0 %
Brady Statistic AS VP Percent: 0 %
Brady Statistic AS VS Percent: 0 %
Date Time Interrogation Session: 20190124161910
HighPow Impedance: 67 Ohm
Implantable Lead Implant Date: 20181205
Implantable Lead Implant Date: 20181205
Implantable Lead Location: 753860
Implantable Pulse Generator Implant Date: 20181205
Lead Channel Impedance Value: 1064 Ohm
Lead Channel Impedance Value: 1311 Ohm
Lead Channel Impedance Value: 261.164
Lead Channel Impedance Value: 323.26 Ohm
Lead Channel Impedance Value: 361 Ohm
Lead Channel Impedance Value: 513 Ohm
Lead Channel Impedance Value: 513 Ohm
Lead Channel Impedance Value: 532 Ohm
Lead Channel Impedance Value: 874 Ohm
Lead Channel Impedance Value: 893 Ohm
Lead Channel Impedance Value: 931 Ohm
Lead Channel Pacing Threshold Amplitude: 0.5 V
Lead Channel Pacing Threshold Amplitude: 1 V
Lead Channel Pacing Threshold Pulse Width: 0.4 ms
Lead Channel Pacing Threshold Pulse Width: 1 ms
Lead Channel Sensing Intrinsic Amplitude: 6 mV
Lead Channel Setting Pacing Pulse Width: 0.4 ms
Lead Channel Setting Sensing Sensitivity: 0.3 mV
MDC IDC LEAD LOCATION: 753858
MDC IDC MSMT BATTERY REMAINING LONGEVITY: 97 mo
MDC IDC MSMT LEADCHNL LV IMPEDANCE VALUE: 1178 Ohm
MDC IDC MSMT LEADCHNL LV IMPEDANCE VALUE: 1197 Ohm
MDC IDC MSMT LEADCHNL LV IMPEDANCE VALUE: 1254 Ohm
MDC IDC MSMT LEADCHNL LV IMPEDANCE VALUE: 325.824
MDC IDC MSMT LEADCHNL LV IMPEDANCE VALUE: 330.703
MDC IDC MSMT LEADCHNL LV IMPEDANCE VALUE: 333.387
MDC IDC MSMT LEADCHNL RA IMPEDANCE VALUE: 4047 Ohm
MDC IDC SET LEADCHNL LV PACING AMPLITUDE: 3 V
MDC IDC SET LEADCHNL LV PACING PULSEWIDTH: 1 ms
MDC IDC SET LEADCHNL RV PACING AMPLITUDE: 3.5 V
MDC IDC STAT BRADY RA PERCENT PACED: 0 %
MDC IDC STAT BRADY RV PERCENT PACED: 99.8 %

## 2017-11-07 NOTE — Progress Notes (Signed)
CRT-D device check in office. Thresholds and sensing consistent with previous device measurements. Lead impedance trends stable over time. No mode switch episodes recorded. No ventricular arrhythmia episodes recorded. Patient bi-ventricularly pacing 100% of the time. Device programmed with appropriate safety margins. Heart failure diagnostics reviewed and trends are stable for patient. Reprogrammed lower rate to 80 bpm from 90 bpm s/p AV node ablation. Estimated longevity 8 years. Patient education completed including shock plan. ROV 11/21/17 in Device clinic to reprogram 4 weeks s/p AV node ablation.

## 2017-11-08 ENCOUNTER — Encounter (HOSPITAL_COMMUNITY): Payer: Self-pay

## 2017-11-08 ENCOUNTER — Other Ambulatory Visit (HOSPITAL_COMMUNITY): Payer: Self-pay

## 2017-11-08 NOTE — Progress Notes (Signed)
Paramedicine Encounter    Patient ID: [name], male    DOB: 1956/03/19, 62 y.o.   MRN: [id number]    Patient Care Team: [name], MD as PCP - General Himmelrich, Bryson Ha, RD (Inactive) as Dietitian  Patient Active Problem List   Diagnosis Date Noted  . Increased prostate specific antigen (PSA) velocity 09/26/2017  . Long term (current) use of anticoagulants 07/04/2017  . Trigger point of thoracic region 07/04/2017  . Right lumbar radiculopathy 05/15/2017  . Right leg swelling 05/09/2017  . Glaucoma 04/18/2017  . History of nasal polyposis 02/19/2017  . Chronic rhinitis 02/19/2017  . Microhematuria 12/04/2016  . Encounter for well adult exam with abnormal findings 11/29/2015  . Type II diabetes mellitus ([place])   . Hyperlipidemia   . Persistent atrial fibrillation ([place]) 03/30/2015  . Syncope 03/30/2015  . Anemia 02/23/2015  . Thyrotoxicosis 02/23/2015  . Hx of adenomatous colonic polyps 10/21/2014  . De Quervain's tenosynovitis, right 04/30/2014  . Osteoarthritis of right knee 02/19/2014  . Encounter for therapeutic drug monitoring 11/17/2013  . Thoracic degenerative disc disease 06/17/2013  . Thoracic spondylosis without myelopathy 02/11/2013  . Lumbosacral spondylosis without myelopathy 02/11/2013  . COPD (chronic obstructive pulmonary disease) ([place]) 08/20/2012  . Bundle branch block, right and extreme left anterior fascicular 05/05/2012  . Nonischemic cardiomyopathy ([place]) 05/02/2012  . Anticoagulated on warfarin 03/21/2012  . Spongiotic dermatitis 09/26/2011  . Past myocardial infarction 04/19/2011  . UNSPECIFIED URETHRAL STRICTURE 12/26/2010  . Chronic pain syndrome 05/18/2010  . Obstructive sleep apnea 10/21/2007  . NEPHROLITHIASIS, HX OF 10/21/2007  . Anxiety 06/09/2007  . GERD 06/09/2007  . BENIGN PROSTATIC HYPERTROPHY 06/09/2007  . Morbid obesity ([place]) 06/03/2007  . Depression 06/03/2007  . Essential hypertension 06/03/2007  . INSOMNIA 06/03/2007     Current Outpatient Medications:  .  ALPRAZolam (XANAX) 0.5 MG tablet, TAKE 1 TO 2 TABLETS BY MOUTH EVERY DAY AT BEDTIME AS NEEDED FOR SLEEP (Patient taking differently: TAKE 1mg   BY MOUTH EVERY DAY AT BEDTIME), Disp: 180 tablet, Rfl: 1 .  carisoprodol (SOMA) 350 MG tablet, TAKE 1 TABLET BY MOUTH 3 TIMES A DAY AS NEEDED FOR MUSCLE SPASM, Disp: 90 tablet, Rfl: 5 .  carvedilol (COREG) 6.25 MG tablet, Take 1.5 tablets (9.375 mg total) by mouth 2 (two) times daily with a meal., Disp: 90 tablet, Rfl: 3 .  digoxin (LANOXIN) 0.25 MG tablet, Take 0.5 tablets (0.125 mg total) by mouth daily., Disp: 30 tablet, Rfl: 11 .  doxepin (SINEQUAN) 10 MG capsule, Take 2 capsules (20 mg total) by mouth at bedtime., Disp: 180 capsule, Rfl: 0 .  ezetimibe (ZETIA) 10 MG tablet, Take 1 tablet (10 mg total) by mouth daily., Disp: 90 tablet, Rfl: 3 .  losartan (COZAAR) 25 MG tablet, Take 1 tablet (25 mg total) by mouth daily., Disp: 30 tablet, Rfl: 11 .  omeprazole (PRILOSEC) 20 MG capsule, Take 1 capsule (20 mg total) by mouth daily with supper., Disp: 90 capsule, Rfl: 1 .  sertraline (ZOLOFT) 100 MG tablet, TAKE 2 TABLETS BY MOUTH AT BEDTIME, Disp: 180 tablet, Rfl: 3 .  spironolactone (ALDACTONE) 25 MG tablet, Take 1 tablet (25 mg total) by mouth daily., Disp: 30 tablet, Rfl: 3 .  torsemide (DEMADEX) 20 MG tablet, Take 2 tablets (40 mg total) by mouth 2 (two) times daily., Disp: 120 tablet, Rfl: 3 .  venlafaxine XR (EFFEXOR-XR) 37.5 MG 24 hr capsule, TAKE ONE CAPSULE BY MOUTH EVERY DAY WITH BREAKFAST, Disp: 90 capsule, Rfl:  1 .  Vitamin D, Ergocalciferol, (DRISDOL) 50000 units CAPS capsule, TAKE ONE CAPSULE BY MOUTH EVERY 7 DAYS, Disp: 12 capsule, Rfl: 0 .  warfarin (COUMADIN) 5 MG tablet, Take as directed by anticoagulation clinic (Patient taking differently: Take 2.5-5 mg by mouth See admin instructions. Take 2.5 mg on Mon / Fri  Take 5 mg on Sun / Tues / Wed/ Thurs/ Sat), Disp: 30 tablet, Rfl: 3 .  acetaminophen  (TYLENOL) 500 MG tablet, Take 500 mg by mouth every 6 (six) hours as needed., Disp: , Rfl:  .  albuterol (VENTOLIN HFA) 108 (90 Base) MCG/ACT inhaler, INHALE 2 PUFFS 4 TIMES A DAY as needed (Patient taking differently: Inhale 2 puffs into the lungs every 6 (six) hours as needed for wheezing or shortness of breath. INHALE 2 PUFFS 4 TIMES A DAY as needed), Disp: 54 Inhaler, Rfl: 3 .  clobetasol ointment (TEMOVATE) 4.25 %, Apply 1 application topically 2 (two) times daily., Disp: 60 g, Rfl: 1 .  fluticasone (FLONASE) 50 MCG/ACT nasal spray, Place 2 sprays into both nostrils daily as needed for allergies or rhinitis., Disp: 18.2 g, Rfl: 5 .  Fluticasone-Salmeterol (ADVAIR DISKUS) 250-50 MCG/DOSE AEPB, Inhale 1 puff into the lungs 2 (two) times daily. (Patient taking differently: Inhale 1 puff into the lungs 2 (two) times daily as needed (shortness of breath). ), Disp: 180 each, Rfl: 3 .  hydrOXYzine (ATARAX/VISTARIL) 10 MG tablet, Take 1 tablet (10 mg total) by mouth 3 (three) times daily as needed., Disp: 60 tablet, Rfl: 2 .  linaclotide (LINZESS) 145 MCG CAPS capsule, Take 1 capsule (145 mcg total) by mouth every other day., Disp: 30 capsule, Rfl: 11 .  potassium chloride SA (K-DUR,KLOR-CON) 20 MEQ tablet, Take 1 tablet (20 mEq total) by mouth daily., Disp: 30 tablet, Rfl: 3 .  predniSONE (DELTASONE) 10 MG tablet, 3 tabs by mouth per day for 3 days,2tabs per day for 3 days,1tab per day for 3 days, Disp: 18 tablet, Rfl: 0 .  traMADol (ULTRAM) 50 MG tablet, TAKE 1 TABLET BY MOUTH EVERY 6 HOURS AS NEEDED (Patient taking differently: TAKE 50 mg  BY MOUTH EVERY 6 HOURS AS NEEDED for pain), Disp: 30 tablet, Rfl: 2 .  traZODone (DESYREL) 100 MG tablet, Take 0.5 tablets (50 mg total) by mouth at bedtime as needed., Disp: 45 tablet, Rfl: 1 .  TUMS 500 MG chewable tablet, Chew 500-2,000 mg by mouth every 4 (four) hours as needed for indigestion or heartburn. , Disp: , Rfl:  .  valACYclovir (VALTREX) 1000 MG tablet,  Take 1 tablet (1,000 mg total) by mouth 3 (three) times daily. (Patient taking differently: Take 1,000 mg by mouth 3 (three) times daily as needed (cold sores). ), Disp: 21 tablet, Rfl: 2 .  zolpidem (AMBIEN) 10 MG tablet, Take 1 tablet (10 mg total) by mouth at bedtime., Disp: 90 tablet, Rfl: 1 Allergies  Allergen Reactions  . Amiodarone Other (See Comments)    hyperthyroidism  . Statins Other (See Comments)    myalgia  . Tape Other (See Comments)    Skin Tears Use Paper Tape Only     Social History   Socioeconomic History  . Marital status: Divorced    Spouse name: Not on file  . Number of children: 2  . Years of education: Not on file  . Highest education level: Not on file  Social Needs  . Financial resource strain: Not on file  . Food insecurity - worry: Not on file  .  Food insecurity - inability: Not on file  . Transportation needs - medical: Not on file  . Transportation needs - non-medical: Not on file  Occupational History  . Occupation: retired/disabled. prev worked in Therapist, sports.    Employer: DISABLED  Tobacco Use  . Smoking status: Former Smoker    Packs/day: 2.00    Years: 30.00    Pack years: 60.00    Types: Cigarettes    Last attempt to quit: 10/16/2007    Years since quitting: 10.0  . Smokeless tobacco: Never Used  Substance and Sexual Activity  . Alcohol use: No  . Drug use: No  . Sexual activity: Not Currently  Other Topics Concern  . Not on file  Social History Narrative   Lives alone.    Physical Exam  Pulmonary/Chest: No respiratory distress.  Abdominal: He exhibits no distension. There is no tenderness.  Musculoskeletal: He exhibits no edema.  Skin: Skin is warm and dry. He is not diaphoretic.        Future Appointments  Date Time Provider [place]  11/12/2017  2:30 PM [facility]  11/22/2017  1:30 PM [facility]  11/22/2017  2:40 PM Larey Dresser, MD [facility]  None  12/19/2017  2:00 PM Deboraha Sprang, MD [facility]  03/27/2018 10:20 AM [name], MD [facility]    ATF pt CAO x4 sitting in the living room, he seems a little down today more than his normal.  He stated that he is tired of being in the house and having no energy.  His pacemaker is in non-demand mode at a rate of 80bpm which was adjusted yesterday from 90bpm.  Pt still have periods of dizziness when he stands but not since yesterday.  Pt became sob walking from the bedroom to the living room but quickly recovered after sitting for a couple of mins.  Pt asked about the new medication that his taking (losartan).  We discussed it and he understands why he's taking it.  Pt has taken all of his meds besides Linzess. He stated that its been about 3 days since he last had it.  It stated that his physician changed the prescription and now the insurance will not approve it until the 30th.  rx bottles verified. Pt still doesn't use a pill box but he knows when and how to take his meds.  pts BP noted, heart failure clinic called and made aware of the same.  No changes made to his meds.   Pt still feels down/depressed would like to go back to the gym but self concious about appearance.  We talked about him going to less busy gyms or walking his dog more often to get out of the house.    BP (!) 86/66 (BP Location: Left Arm, Patient Position: Sitting, Cuff Size: Normal)   Pulse 80   Resp 16   Wt 261 lb 6.4 oz (118.6 kg)   SpO2 98%   BMI 37.51 kg/m       Aleksandra Raben, EMT Paramedic 11/08/2017    ACTION: Home visit completed

## 2017-11-12 ENCOUNTER — Ambulatory Visit (INDEPENDENT_AMBULATORY_CARE_PROVIDER_SITE_OTHER): Payer: Medicare HMO | Admitting: General Practice

## 2017-11-12 DIAGNOSIS — Z7901 Long term (current) use of anticoagulants: Secondary | ICD-10-CM

## 2017-11-12 DIAGNOSIS — G4733 Obstructive sleep apnea (adult) (pediatric): Secondary | ICD-10-CM | POA: Diagnosis not present

## 2017-11-12 LAB — POCT INR: INR: 3.8

## 2017-11-12 NOTE — Patient Instructions (Addendum)
Pre visit review using our clinic review tool, if applicable. No additional management support is needed unless otherwise documented below in the visit note.  Skip coumadin today and then continue to take 1 tablet daily.  Re-check in 3 weeks.

## 2017-11-14 ENCOUNTER — Telehealth: Payer: Self-pay | Admitting: Internal Medicine

## 2017-11-14 ENCOUNTER — Other Ambulatory Visit: Payer: Self-pay | Admitting: Internal Medicine

## 2017-11-14 NOTE — Telephone Encounter (Signed)
Walk in pt Form-patient has questions about medications. Placed in [place] doc box.

## 2017-11-20 ENCOUNTER — Other Ambulatory Visit: Payer: Self-pay | Admitting: Internal Medicine

## 2017-11-20 ENCOUNTER — Other Ambulatory Visit (HOSPITAL_COMMUNITY): Payer: Self-pay | Admitting: Cardiology

## 2017-11-22 ENCOUNTER — Encounter (HOSPITAL_COMMUNITY): Payer: Self-pay | Admitting: Cardiology

## 2017-11-22 ENCOUNTER — Other Ambulatory Visit (HOSPITAL_COMMUNITY): Payer: Self-pay

## 2017-11-22 ENCOUNTER — Ambulatory Visit (HOSPITAL_COMMUNITY)
Admission: RE | Admit: 2017-11-22 | Discharge: 2017-11-22 | Disposition: A | Discharge status: Home / Self Care | Payer: Medicare HMO | Source: Ambulatory Visit | Attending: Cardiology | Admitting: Cardiology

## 2017-11-22 ENCOUNTER — Telehealth: Payer: Self-pay | Admitting: *Deleted

## 2017-11-22 ENCOUNTER — Ambulatory Visit (INDEPENDENT_AMBULATORY_CARE_PROVIDER_SITE_OTHER): Payer: Self-pay | Admitting: *Deleted

## 2017-11-22 ENCOUNTER — Encounter (INDEPENDENT_AMBULATORY_CARE_PROVIDER_SITE_OTHER): Payer: Self-pay

## 2017-11-22 VITALS — BP 123/69 | HR 71 | Wt 263.8 lb

## 2017-11-22 DIAGNOSIS — Z95810 Presence of automatic (implantable) cardiac defibrillator: Secondary | ICD-10-CM

## 2017-11-22 DIAGNOSIS — I428 Other cardiomyopathies: Secondary | ICD-10-CM

## 2017-11-22 DIAGNOSIS — Z9981 Dependence on supplemental oxygen: Secondary | ICD-10-CM | POA: Diagnosis not present

## 2017-11-22 DIAGNOSIS — R0609 Other forms of dyspnea: Secondary | ICD-10-CM | POA: Diagnosis not present

## 2017-11-22 DIAGNOSIS — R21 Rash and other nonspecific skin eruption: Secondary | ICD-10-CM | POA: Diagnosis not present

## 2017-11-22 DIAGNOSIS — N400 Enlarged prostate without lower urinary tract symptoms: Secondary | ICD-10-CM | POA: Diagnosis not present

## 2017-11-22 DIAGNOSIS — K219 Gastro-esophageal reflux disease without esophagitis: Secondary | ICD-10-CM | POA: Diagnosis not present

## 2017-11-22 DIAGNOSIS — I481 Persistent atrial fibrillation: Secondary | ICD-10-CM | POA: Diagnosis not present

## 2017-11-22 DIAGNOSIS — I5022 Chronic systolic (congestive) heart failure: Secondary | ICD-10-CM

## 2017-11-22 DIAGNOSIS — Z9889 Other specified postprocedural states: Secondary | ICD-10-CM | POA: Insufficient documentation

## 2017-11-22 DIAGNOSIS — I442 Atrioventricular block, complete: Secondary | ICD-10-CM | POA: Diagnosis not present

## 2017-11-22 DIAGNOSIS — Z87891 Personal history of nicotine dependence: Secondary | ICD-10-CM | POA: Diagnosis not present

## 2017-11-22 DIAGNOSIS — Z7901 Long term (current) use of anticoagulants: Secondary | ICD-10-CM | POA: Diagnosis not present

## 2017-11-22 DIAGNOSIS — M791 Myalgia, unspecified site: Secondary | ICD-10-CM | POA: Diagnosis not present

## 2017-11-22 DIAGNOSIS — E785 Hyperlipidemia, unspecified: Secondary | ICD-10-CM | POA: Diagnosis not present

## 2017-11-22 DIAGNOSIS — I4819 Other persistent atrial fibrillation: Secondary | ICD-10-CM

## 2017-11-22 DIAGNOSIS — Z95811 Presence of heart assist device: Secondary | ICD-10-CM | POA: Diagnosis not present

## 2017-11-22 DIAGNOSIS — Z79899 Other long term (current) drug therapy: Secondary | ICD-10-CM | POA: Diagnosis not present

## 2017-11-22 DIAGNOSIS — I2510 Atherosclerotic heart disease of native coronary artery without angina pectoris: Secondary | ICD-10-CM | POA: Diagnosis not present

## 2017-11-22 DIAGNOSIS — J449 Chronic obstructive pulmonary disease, unspecified: Secondary | ICD-10-CM | POA: Diagnosis not present

## 2017-11-22 DIAGNOSIS — I110 Hypertensive heart disease with heart failure: Secondary | ICD-10-CM | POA: Insufficient documentation

## 2017-11-22 DIAGNOSIS — I482 Chronic atrial fibrillation: Secondary | ICD-10-CM | POA: Insufficient documentation

## 2017-11-22 LAB — COMPREHENSIVE METABOLIC PANEL
ALK PHOS: 55 U/L (ref 38–126)
ALT: 28 U/L (ref 17–63)
ANION GAP: 10 (ref 5–15)
AST: 31 U/L (ref 15–41)
Albumin: 3.9 g/dL (ref 3.5–5.0)
BUN: 26 mg/dL — ABNORMAL HIGH (ref 6–20)
CALCIUM: 8.9 mg/dL (ref 8.9–10.3)
CO2: 23 mmol/L (ref 22–32)
CREATININE: 1.09 mg/dL (ref 0.61–1.24)
Chloride: 107 mmol/L (ref 101–111)
Glucose, Bld: 77 mg/dL (ref 65–99)
Potassium: 4.1 mmol/L (ref 3.5–5.1)
Sodium: 140 mmol/L (ref 135–145)
TOTAL PROTEIN: 6.4 g/dL — AB (ref 6.5–8.1)
Total Bilirubin: 0.5 mg/dL (ref 0.3–1.2)

## 2017-11-22 LAB — CUP PACEART INCLINIC DEVICE CHECK
Battery Remaining Longevity: 82 mo
Brady Statistic AP VS Percent: 0 %
Brady Statistic AS VP Percent: 0 %
Brady Statistic AS VS Percent: 0 %
Brady Statistic RA Percent Paced: 0 %
Brady Statistic RV Percent Paced: 99.83 %
HighPow Impedance: 70 Ohm
Implantable Lead Implant Date: 20181205
Implantable Lead Location: 753858
Implantable Pulse Generator Implant Date: 20181205
Lead Channel Impedance Value: 1178 Ohm
Lead Channel Impedance Value: 1178 Ohm
Lead Channel Impedance Value: 245.538
Lead Channel Impedance Value: 292.657
Lead Channel Impedance Value: 325.111
Lead Channel Impedance Value: 361 Ohm
Lead Channel Impedance Value: 4047 Ohm
Lead Channel Impedance Value: 456 Ohm
Lead Channel Impedance Value: 532 Ohm
Lead Channel Impedance Value: 836 Ohm
Lead Channel Impedance Value: 988 Ohm
Lead Channel Setting Pacing Amplitude: 3.5 V
Lead Channel Setting Pacing Pulse Width: 0.4 ms
Lead Channel Setting Pacing Pulse Width: 1 ms
MDC IDC LEAD IMPLANT DT: 20181205
MDC IDC LEAD LOCATION: 753860
MDC IDC MSMT BATTERY VOLTAGE: 3.03 V
MDC IDC MSMT LEADCHNL LV IMPEDANCE VALUE: 1121 Ohm
MDC IDC MSMT LEADCHNL LV IMPEDANCE VALUE: 1140 Ohm
MDC IDC MSMT LEADCHNL LV IMPEDANCE VALUE: 295.059
MDC IDC MSMT LEADCHNL LV IMPEDANCE VALUE: 322.197
MDC IDC MSMT LEADCHNL LV IMPEDANCE VALUE: 817 Ohm
MDC IDC MSMT LEADCHNL LV IMPEDANCE VALUE: 836 Ohm
MDC IDC MSMT LEADCHNL RV IMPEDANCE VALUE: 513 Ohm
MDC IDC SESS DTM: 20190208175312
MDC IDC SET LEADCHNL LV PACING AMPLITUDE: 3.5 V
MDC IDC SET LEADCHNL RV SENSING SENSITIVITY: 0.3 mV
MDC IDC STAT BRADY AP VP PERCENT: 0 %

## 2017-11-22 LAB — DIGOXIN LEVEL: Digoxin Level: 0.4 ng/mL — ABNORMAL LOW (ref 0.8–2.0)

## 2017-11-22 NOTE — Patient Instructions (Signed)
Labs drawn today (if we do not call you, then your lab work was stable)   Your physician recommends that you schedule a follow-up appointment in: 1 month with Dr. [name]

## 2017-11-22 NOTE — Progress Notes (Signed)
Paramedicine Encounter   Patient ID: [name] , male,   DOB: 02/21/1956,61 y.o.,  MRN: [id number]   Met patient in clinic today with provider Dr. [name].   Pt asked about the treatment for the rashes on his skin.  He has been diagnosed with spongy.    Dr. [name] advised him that he can take the methotrexate for the rash.  He will fax over a letter stating the same to his dermatologist.  Pt stated that he thinks that he. Pt stated that he still have sob and dizziness during the day.  He was advised that some of his sob can be due to COPD.  He's only wearing 02 at night (no change in that).  Pt asked about why he cant take prednisone; he advised him that it could be due to the side effects.    Continue taking spirolactone and losartan in the pm due to the dizziness. He advised pt to try the methotrexate first and if it doesn't work after a few weeks, he will look into giving him a low dose of prednisone.     Time spent with patient 6mns  Elmyra Banwart, EMT-Paramedic 11/22/2017   ACTION: Next visit planned for week of 27th

## 2017-11-22 NOTE — Progress Notes (Addendum)
CRT-D device check in office to reduce lower rate to 70bpm s/p AVN ablation. Chronic AF, +warfarin. No ventricular arrhythmia episodes recorded. Patient bi-ventricularly pacing 99.8% of the time. LV output increased to 3.5V @ 1.3ms for appropriate safety margin until 3 month f/u. Lower rate reduced to 70bpm, rate response turned on per SK. Estimated longevity 6.8 years. Patient enrolled in remote follow up. Patient education completed including shock plan. ROV with SK on 12/19/17.

## 2017-11-22 NOTE — Telephone Encounter (Signed)
Spoke with patient.  He reports that Dr. [name] has given him approval to start methotrexate for his rash and will make dermatologist aware. Advised I will make Dr. Olin Pia nurse aware and patient is appreciative.  He denies any additional questions or concerns at this time.

## 2017-11-24 DIAGNOSIS — J449 Chronic obstructive pulmonary disease, unspecified: Secondary | ICD-10-CM | POA: Diagnosis not present

## 2017-11-24 NOTE — Progress Notes (Signed)
PCP: Dr. [name] EP: [name] HF Cardiology: [name]  62 yo with history of COPD on home oxygen, permanent atrial fibrillation, and chronic systolic CHF was referred by Dr. [name] for evaluation of CHF.  Amiodarone and DCCV were tried for atrial fibrillation in the past without success.   Patient has been noted to have a low EF since 2013.  EF improved by to normal by 2015 but dropped to 30-35% by 11/16.  Cath in 6/17 showed some CAD but not enough to cause cardiomyopathy.  He was admitted in 3/18 with PNA, atrial fibrillation/RVR, and acute on chronic systolic CHF.  Echo in 3/18 showed fall in EF to 10-15%.   I took him for right and left heart cath in 4/18, which showed nonobstructive CAD and relatively optimized filling pressures.    Syncope in 11/18. Loop recorder showed 6 second pause and periods of complete heart block. He had Medtronic BiV ICD placed in 12/18.  In 1/19, he had AV nodal ablation to promote BiV pacing.   Patient is seen today in followup of CHF/dyspnea. He is using oxygen only at night now for the most part. He has been sleeping in a recliner long-term.  Has OSA but cannot tolerate CPAP.  Weight is down 2 lbs.  SBP generally running in the 90s at home.  No dyspnea walking at a steady pace on flat ground.  Dyspnea with stairs and inclines.  No chest pain.  Main complaint is diffuse itching from spongiotic dermatitis.  This is very distressing.   Labs (4/18): pro-BNP 904, K 3.1, creatinine 1.39 => 1.08 with BUN 66 => 37, hgb 14.1.  Labs (5/18): hemoglobin 13.1 Labs (6/18): K 4, creatinine 1.3, BNP 294 Labs (7/18): K 3.8, creatinine 1.23, LDL 96, HDL 42 Labs (8/18): K 4.5, creatinine 1.22 Labs (10/18): digoxin 0.4 Labs (12/18): LDL 115, HDL 37, TGs 214, K 4.2, creatinine 0.84 Labs (1/19): K 4.4, creatinine 0.75, pro-BNP 433  PMH: 1. Asthma 2. COPD: On home oxygen. Prior smoker.  3. Atrial fibrillation: Permanent.  He failed DCCV and amiodarone in the past . 4. HTN 5. GERD 6.  Hyperlipidemia 7. BPH.  8. Chronic systolic CHF: Nonischemic cardiomyopathy.  - EF 25% by TEE in 7/13.  Normalized on 2015 echo. - Echo (11/16): EF 30-35%.  - LHC/RHC (6/17): 80% ostial stenosis small D1, 30-40% pLAD.  Mean RA 4, PA 26/10, mean PCWP 11, CI 2.33.  - Echo (3/18): EF 10-15%, diffuse hypokinesis, severe LV dilation, moderate RV dilation with severely decreased systolic function.  - LHC/RHC (4/18): 40% proximal LAD.  Mean RA 8, PA 37/10 mean 22, mean PCWP 22, LVEDP 9, CI 2.39. - Medtronic BiV ICD placement in 12/18 with AV nodal ablation to promote BiV pacing in 1/19.  9. Morbid obesity 10. ILR in place.  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 Complete heart block: s/p Medtronic CRT-D.   Social History   Socioeconomic History  . Marital status: Divorced    Spouse name: Not on file  . Number of children: 2  . Years of education: Not on file  . Highest education level: Not on file  Social Needs  . Financial resource strain: Not on file  . Food insecurity - worry: Not on file  . Food insecurity - inability: Not on file  . Transportation needs - medical: Not on file  . Transportation needs - non-medical: Not on file  Occupational History  . Occupation: retired/disabled. prev worked in Therapist, sports.    Employer: DISABLED  Tobacco Use  . Smoking status: Former Smoker    Packs/day: 2.00    Years: 30.00    Pack years: 60.00    Types: Cigarettes    Last attempt to quit: 10/16/2007    Years since quitting: 10.1  . Smokeless tobacco: Never Used  Substance and Sexual Activity  . Alcohol use: No  . Drug use: No  . Sexual activity: Not Currently  Other Topics Concern  . Not on file  Social History Narrative   Lives alone.   Family History  Problem Relation Age of Onset  . COPD Mother   . Asthma Mother   . Colon polyps Mother   . Allergies Mother   . Hypothyroidism Mother   . Asthma Maternal Grandmother   . Colon cancer Neg Hx    ROS: All systems reviewed  and negative except as per HPI.   Current Outpatient Medications  Medication Sig Dispense Refill  . acetaminophen (TYLENOL) 500 MG tablet Take 500 mg by mouth every 6 (six) hours as needed.    [name] albuterol (VENTOLIN HFA) 108 (90 Base) MCG/ACT inhaler INHALE 2 PUFFS 4 TIMES A DAY as needed (Patient taking differently: Inhale 2 puffs into the lungs every 6 (six) hours as needed for wheezing or shortness of breath. INHALE 2 PUFFS 4 TIMES A DAY as needed) 54 Inhaler 3  . ALPRAZolam (XANAX) 0.5 MG tablet TAKE 1 TO 2 TABLETS BY MOUTH EVERY DAY AT BEDTIME AS NEEDED FOR SLEEP (Patient taking differently: TAKE 1mg   BY MOUTH EVERY DAY AT BEDTIME) 180 tablet 1  . carisoprodol (SOMA) 350 MG tablet TAKE 1 TABLET BY MOUTH 3 TIMES A DAY AS NEEDED FOR MUSCLE SPASM 90 tablet 5  . carvedilol (COREG) 6.25 MG tablet Take 1.5 tablets (9.375 mg total) by mouth 2 (two) times daily with a meal. 90 tablet 3  . clobetasol ointment (TEMOVATE) 3.29 % Apply 1 application topically 2 (two) times daily. 60 g 1  . digoxin (LANOXIN) 0.25 MG tablet Take 0.5 tablets (0.125 mg total) by mouth daily. 30 tablet 11  . doxepin (SINEQUAN) 10 MG capsule Take 2 capsules (20 mg total) by mouth at bedtime. 180 capsule 0  . ezetimibe (ZETIA) 10 MG tablet Take 1 tablet (10 mg total) by mouth daily. 90 tablet 3  . fluticasone (FLONASE) 50 MCG/ACT nasal spray Place 2 sprays into both nostrils daily as needed for allergies or rhinitis. 18.2 g 5  . Fluticasone-Salmeterol (ADVAIR DISKUS) 250-50 MCG/DOSE AEPB Inhale 1 puff into the lungs 2 (two) times daily. (Patient taking differently: Inhale 1 puff into the lungs 2 (two) times daily as needed (shortness of breath). ) 180 each 3  . hydrOXYzine (ATARAX/VISTARIL) 10 MG tablet Take 1 tablet (10 mg total) by mouth 3 (three) times daily as needed. 60 tablet 2  . linaclotide (LINZESS) 145 MCG CAPS capsule Take 1 capsule (145 mcg total) by mouth every other day. 30 capsule 11  . losartan (COZAAR) 25 MG  tablet Take 1 tablet (25 mg total) by mouth daily. 30 tablet 11  . omeprazole (PRILOSEC) 20 MG capsule Take 1 capsule (20 mg total) by mouth daily with supper. 90 capsule 1  . sertraline (ZOLOFT) 100 MG tablet TAKE 2 TABLETS BY MOUTH AT BEDTIME 180 tablet 3  . silver sulfADIAZINE (SILVADENE) 1 % cream APPLY TO AFFECTED AREA TOPICALLY EVERY DAY 50 g 3  . spironolactone (ALDACTONE) 25 MG tablet Take 1 tablet (25 mg total) by mouth daily. 30 tablet 3  .  torsemide (DEMADEX) 20 MG tablet Take 2 tablets (40 mg total) by mouth 2 (two) times daily. 120 tablet 3  . traMADol (ULTRAM) 50 MG tablet TAKE 1 TABLET BY MOUTH EVERY 6 HOURS AS NEEDED (Patient taking differently: TAKE 50 mg  BY MOUTH EVERY 6 HOURS AS NEEDED for pain) 30 tablet 2  . TUMS 500 MG chewable tablet Chew 500-2,000 mg by mouth every 4 (four) hours as needed for indigestion or heartburn.     . valACYclovir (VALTREX) 1000 MG tablet Take 1 tablet (1,000 mg total) by mouth 3 (three) times daily. (Patient taking differently: Take 1,000 mg by mouth 3 (three) times daily as needed (cold sores). ) 21 tablet 2  . Vitamin D, Ergocalciferol, (DRISDOL) 50000 units CAPS capsule TAKE ONE CAPSULE BY MOUTH EVERY 7 DAYS 12 capsule 0  . warfarin (COUMADIN) 5 MG tablet Take as directed by anticoagulation clinic (Patient taking differently: Take 2.5-5 mg by mouth See admin instructions. Take 2.5 mg on Mon / Fri  Take 5 mg on Sun / Tues / Wed/ Thurs/ Sat) 30 tablet 3  . zolpidem (AMBIEN) 10 MG tablet Take 1 tablet (10 mg total) by mouth at bedtime. 90 tablet 1   No current facility-administered medications for this encounter.    BP 123/69   Pulse 71   Wt 263 lb 12 oz (119.6 kg)   SpO2 98%   BMI 37.84 kg/m  General: NAD Neck: No JVD, no thyromegaly or thyroid nodule.  Lungs: Clear to auscultation bilaterally with normal respiratory effort. CV: Nondisplaced PMI.  Heart regular S1/S2, no S3/S4, no murmur. Trace ankle edema.  No carotid bruit.  Normal pedal  pulses.  Abdomen: Soft, nontender, no hepatosplenomegaly, no distention.  Skin: Diffuse erythematous macular rash.  Neurologic: Alert and oriented x 3.  Psych: Normal affect. Extremities: No clubbing or cyanosis.  HEENT: Normal.   Assessment/Plan:  1. Atrial fibrillation: Permanent.  Now s/p AV nodal ablation to allow effective BiV pacing.  - Continue Coreg and warfarin.  2. COPD: No longer smoking.  He is on home oxygen at night.   3. Chronic systolic CHF: Nonischemic cardiomyopathy based on 6/17 cath. However, he had a recent significant fall in EF from 30-35% to 10-15% on last echo from 3/18.  LHC/RHC in 4/18 showed nonobstructive CAD and relatively optimized filling pressures.  He now has Medtronic CRT-D device with AV nodal ablation to allow BiV pacing.  Weight down 2 lbs, not volume overloaded on exam.  No BP room with SBP 90s at home to increase meds.  - Continue Coreg 9.375 mg bid.  - Continue spironolactone 25 mg daily. BMET today.  - Continue torsemide 40 mg bid.       - Continue losartan 25 mg daily. - Continue digoxin 0.125 daily, check level today.     4. CAD: Nonobstructive on 4/18 cath.  No exertional chest pain.  - He is on warfarin so no ASA.  - Myalgias with multiple statins, unable to tolerate. LDL remain high on Zetia, have referred to lipid clinic for [place].  5. Complete heart block: Now has Medtronic CRT-D device and s/p AV nodal ablation.  6. Rash: Spongiotic dermatitis.  This is extremely bothersome for him.  Dermatologist wants to start him on methotrexate and wanted to check to make sure this is ok with his cardiac meds.  I am ok with him taking methotrexate.   Followup 1 month.   Loralie Champagne 11/24/2017

## 2017-12-06 ENCOUNTER — Ambulatory Visit (INDEPENDENT_AMBULATORY_CARE_PROVIDER_SITE_OTHER): Payer: Medicare HMO | Admitting: General Practice

## 2017-12-06 DIAGNOSIS — Z7901 Long term (current) use of anticoagulants: Secondary | ICD-10-CM | POA: Diagnosis not present

## 2017-12-06 LAB — POCT INR: INR: 1.5

## 2017-12-06 NOTE — Patient Instructions (Addendum)
Pre visit review using our clinic review tool, if applicable. No additional management support is needed unless otherwise documented below in the visit note.  Take 1 1/2 tablets today (2/22) and tomorrow (2/23) and then change dosage to 1 tablet daily except 1 1/2 tablets on Wednesdays.  Re-check in 2 weeks.

## 2017-12-07 ENCOUNTER — Other Ambulatory Visit: Payer: Self-pay | Admitting: Internal Medicine

## 2017-12-07 DIAGNOSIS — L308 Other specified dermatitis: Secondary | ICD-10-CM

## 2017-12-09 ENCOUNTER — Telehealth: Payer: Self-pay | Admitting: Internal Medicine

## 2017-12-09 DIAGNOSIS — L308 Other specified dermatitis: Secondary | ICD-10-CM

## 2017-12-09 MED ORDER — DOXEPIN HCL 10 MG PO CAPS: 20.0000 mg | ORAL_CAPSULE | Freq: Every day | ORAL | 0 refills | Status: DC

## 2017-12-09 NOTE — Telephone Encounter (Signed)
Copied from [place]. Topic: Quick Communication - See Telephone Encounter >> Dec 09, 2017  1:02 PM Hewitt Shorts wrote: CRM for notification. See Telephone encounter for:  Pt is wanting to talk with some one regarding his doxepin being denied  Best number [phone]  [facility] [address]  12/09/17.

## 2017-12-09 NOTE — Telephone Encounter (Signed)
Refill was sent to the wrong provider. Can doxepin (SINEQUAN) 10 MG capsule be sent to [facility] on [address]?

## 2017-12-09 NOTE — Telephone Encounter (Signed)
Done

## 2017-12-12 DIAGNOSIS — G4733 Obstructive sleep apnea (adult) (pediatric): Secondary | ICD-10-CM | POA: Diagnosis not present

## 2017-12-19 ENCOUNTER — Ambulatory Visit (INDEPENDENT_AMBULATORY_CARE_PROVIDER_SITE_OTHER): Payer: Medicare HMO | Admitting: *Deleted

## 2017-12-19 ENCOUNTER — Encounter: Payer: Self-pay | Admitting: Internal Medicine

## 2017-12-19 ENCOUNTER — Ambulatory Visit (INDEPENDENT_AMBULATORY_CARE_PROVIDER_SITE_OTHER): Payer: Medicare HMO | Admitting: Internal Medicine

## 2017-12-19 VITALS — BP 90/68 | HR 74 | Ht 70.0 in | Wt 265.0 lb

## 2017-12-19 DIAGNOSIS — I442 Atrioventricular block, complete: Secondary | ICD-10-CM

## 2017-12-19 DIAGNOSIS — Z95810 Presence of automatic (implantable) cardiac defibrillator: Secondary | ICD-10-CM

## 2017-12-19 DIAGNOSIS — I4891 Unspecified atrial fibrillation: Secondary | ICD-10-CM

## 2017-12-19 DIAGNOSIS — I428 Other cardiomyopathies: Secondary | ICD-10-CM

## 2017-12-19 DIAGNOSIS — Z7901 Long term (current) use of anticoagulants: Secondary | ICD-10-CM

## 2017-12-19 DIAGNOSIS — Z9889 Other specified postprocedural states: Secondary | ICD-10-CM | POA: Diagnosis not present

## 2017-12-19 LAB — CUP PACEART INCLINIC DEVICE CHECK
Battery Voltage: 3.01 V
Brady Statistic AP VS Percent: 0 %
Brady Statistic RA Percent Paced: 0 %
Brady Statistic RV Percent Paced: 99.83 %
Date Time Interrogation Session: 20190307153606
HIGH POWER IMPEDANCE MEASURED VALUE: 71 Ohm
Implantable Lead Implant Date: 20181205
Implantable Lead Location: 753858
Implantable Lead Model: 4398
Implantable Pulse Generator Implant Date: 20181205
Lead Channel Impedance Value: 1121 Ohm
Lead Channel Impedance Value: 1254 Ohm
Lead Channel Impedance Value: 1311 Ohm
Lead Channel Impedance Value: 1425 Ohm
Lead Channel Impedance Value: 330.75 Ohm
Lead Channel Impedance Value: 333.117
Lead Channel Impedance Value: 338.545
Lead Channel Impedance Value: 341.026
Lead Channel Impedance Value: 4047 Ohm
Lead Channel Impedance Value: 513 Ohm
Lead Channel Impedance Value: 950 Ohm
Lead Channel Setting Pacing Amplitude: 2 V
Lead Channel Setting Pacing Pulse Width: 0.6 ms
MDC IDC LEAD IMPLANT DT: 20181205
MDC IDC LEAD LOCATION: 753860
MDC IDC MSMT BATTERY REMAINING LONGEVITY: 102 mo
MDC IDC MSMT LEADCHNL LV IMPEDANCE VALUE: 1007 Ohm
MDC IDC MSMT LEADCHNL LV IMPEDANCE VALUE: 1406 Ohm
MDC IDC MSMT LEADCHNL LV IMPEDANCE VALUE: 261.164
MDC IDC MSMT LEADCHNL LV IMPEDANCE VALUE: 513 Ohm
MDC IDC MSMT LEADCHNL LV IMPEDANCE VALUE: 532 Ohm
MDC IDC MSMT LEADCHNL LV IMPEDANCE VALUE: 931 Ohm
MDC IDC MSMT LEADCHNL LV PACING THRESHOLD AMPLITUDE: 1.25 V
MDC IDC MSMT LEADCHNL LV PACING THRESHOLD PULSEWIDTH: 0.6 ms
MDC IDC MSMT LEADCHNL RV IMPEDANCE VALUE: 418 Ohm
MDC IDC MSMT LEADCHNL RV PACING THRESHOLD AMPLITUDE: 0.5 V
MDC IDC MSMT LEADCHNL RV PACING THRESHOLD PULSEWIDTH: 0.4 ms
MDC IDC MSMT LEADCHNL RV SENSING INTR AMPL: 6 mV
MDC IDC SET LEADCHNL RV PACING AMPLITUDE: 2.5 V
MDC IDC SET LEADCHNL RV PACING PULSEWIDTH: 0.4 ms
MDC IDC SET LEADCHNL RV SENSING SENSITIVITY: 0.3 mV
MDC IDC STAT BRADY AP VP PERCENT: 0 %
MDC IDC STAT BRADY AS VP PERCENT: 0 %
MDC IDC STAT BRADY AS VS PERCENT: 0 %

## 2017-12-19 LAB — POCT INR: INR: 3.4

## 2017-12-19 NOTE — Patient Instructions (Addendum)
Medication Instructions:  Your physician recommends that you continue on your current medications as directed. Please refer to the Current Medication list given to you today.  Labwork: CBC, CMET,Direct LDL  Testing/Procedures: None ordered.   Follow-Up: Your physician recommends that you schedule a follow-up appointment in: 9 months with Chanetta Marshall  Remote monitoring is used to monitor your ICD from home. This monitoring reduces the number of office visits required to check your device to one time per year. It allows [place] to keep an eye on the functioning of your device to ensure it is working properly. You are scheduled for a device check from home on 03/20/2018. You may send your transmission at any time that day. If you have a wireless device, the transmission will be sent automatically. After your physician reviews your transmission, you will receive a postcard with your next transmission date.   Any Other Special Instructions Will Be Listed Below (If Applicable).     If you need a refill on your cardiac medications before your next appointment, please call your pharmacy.

## 2017-12-19 NOTE — Progress Notes (Signed)
He was      Patient Care Team: [name], MD as PCP - General Himmelrich, Bryson Ha, RD (Inactive) as Dietitian   HPI  [name] is a 62 y.o. male seen in followup for recent CRT implantation.    He has had syncope.  ILR had documented pauses greater than 6 seconds.  Not withstanding his right bundle branch block, it was very wide and so discussions with Dr. DM we elected CRT-D implantation.    H which is to e also has atrial fibrillation for which he was treated with amiodarone.  Having had recurrent and persistence of atrial fibrillation he underwent AV junction ablation following CRT implantation  He uses oxygen but not CPAP for his sleep apnea     Echocardiogram 7/13 demonstrated EF 20-25% with severe biatrial enlargement. Repeat echo 8/14 and demonstrated an intercurrent normalization of LV function with biatrial enlargement and mild RV dilatation    He is considerably better following CRT.  He has more energy.  Less dyspnea and no edema.  DATE TEST     /2012 myoview  EF44 Prior IMI perfsion defect  /2015 myoview   EF57% Perfusion defect--MI vs bowel  2016 myoview 35-40 NO perfusion defect  6/16 Echo 40-45 Severe BAE  6/17 Cath 30 no CAD   3/18 Echo EF20-25%   4/18 LHC/RHC  No CAD         Antiarrhythmics Date  dofetilide 2013  amidoarone 2016   Date Cr K Dig Hgb  8/18 1.22 4.5  12.2  2/19  1.09 4.1 0.4              Past Medical History:  Diagnosis Date  . AICD (automatic cardioverter/defibrillator) present   . Anemia    supposed to be taking Vit B but doesn't  . ANXIETY    takes Xanax nightly  . Arthritis   . Asthma    Albuterol prn and Advair daily;also takes Prednisone daily  . Atrial fibrillation ([place]) 09/06/2015  . Cardiomyopathy [facility])    a. EF 25% TEE July 2013; b. EF normalized 2015;  c. 03/2015 Echo: EF 40-45%, difrf HK, PASP 38 mmHg, Mild MR, sev LAE/RAE.  [name] Chronic constipation    takes OTC stool softener  . COPD (chronic  obstructive pulmonary disease) ([place])    "one dr says COPD; one dr says emphysema" (09/18/2017)  . DEPRESSION    takes Zoloft and Doxepin daily  . Diverticulitis   . DYSKINESIA, ESOPHAGUS   . Emphysema of lung [facility])    "one dr says COPD; one dr says emphysema" (09/18/2017)  . Essential hypertension       . FIBROMYALGIA   . GERD (gastroesophageal reflux disease)       . Glaucoma   . HYPERLIPIDEMIA    a. Intolerant to statins.  . INSOMNIA    takes Ambien nightly  . Myocardial infarction [facility])    a. 2012 Myoview notable for prior infarct;  b. 03/2015 Lexiscan CL: EF 37%, diff HK, small area of inferior infarct from apex to base-->Med Rx.  . Myocardial infarction ([place])   . O2 dependent    "2.5L q hs & prn" (09/18/2017)  . Paroxysmal atrial fibrillation ([facility])    a. CHA2DS2VASc = 3--> takes Coumadin;  b. 03/15/2015 Successful TEE/DCCV;  c. 03/2015 recurrent afib, Amio d/c'd in setting of hyperthyroidism.  . Peripheral neuropathy   . Pneumonia 12/2016  . Rash and other nonspecific skin eruption 04/12/2009   no cause found  saw dermatologists x 2 and allergist  . SLEEP APNEA, OBSTRUCTIVE    a. doesn't use CPAP  . Syncope    a. 03/2015 s/p MDT LINQ.  [name] Type II diabetes mellitus ([place])         Past Surgical History:  Procedure Laterality Date  . ACNE CYST REMOVAL     2 on back   . AV NODE ABLATION N/A 10/25/2017   Procedure: AV NODE ABLATION;  Surgeon: Deboraha Sprang, MD;  Location: [place] CV LAB;  Service: Cardiovascular;  Laterality: N/A;  . BIV ICD INSERTION CRT-D N/A 09/18/2017   Procedure: BIV ICD INSERTION CRT-D;  Surgeon: Deboraha Sprang, MD;  Location: [place] CV LAB;  Service: Cardiovascular;  Laterality: N/A;  . CARDIAC CATHETERIZATION N/A 03/21/2016   Procedure: Right/Left Heart Cath and Coronary Angiography;  Surgeon: Larey Dresser, MD;  Location: [place] CV LAB;  Service: Cardiovascular;  Laterality: N/A;  . CARDIOVERSION  04/18/2012   Procedure: CARDIOVERSION;   Surgeon: Fay Records, MD;  Location: [place];  Service: Cardiovascular;  Laterality: N/A;  . CARDIOVERSION  04/25/2012   Procedure: CARDIOVERSION;  Surgeon: Thayer Headings, MD;  Location: [place];  Service: Cardiovascular;  Laterality: N/A;  . CARDIOVERSION  04/25/2012   Procedure: CARDIOVERSION;  Surgeon: Fay Records, MD;  Location: [place];  Service: Cardiovascular;  Laterality: N/A;  . CARDIOVERSION  05/09/2012   Procedure: CARDIOVERSION;  Surgeon: Sherren Mocha, MD;  Location: [place];  Service: Cardiovascular;  Laterality: N/A;  changed from crenshaw to cooper by trish/leone-endo  . CARDIOVERSION N/A 03/15/2015   Procedure: CARDIOVERSION;  Surgeon: Thayer Headings, MD;  Location: [facility] ENDOSCOPY;  Service: Cardiovascular;  Laterality: N/A;  . COLONOSCOPY    . COLONOSCOPY WITH PROPOFOL N/A 10/21/2014   Procedure: COLONOSCOPY WITH PROPOFOL;  Surgeon: Ladene Artist, MD;  Location: [facility] ENDOSCOPY;  Service: Endoscopy;  Laterality: N/A;  . EP IMPLANTABLE DEVICE N/A 04/06/2015   Procedure: Loop Recorder Insertion;  Surgeon: Evans Lance, MD;  Location: [place] CV LAB;  Service: Cardiovascular;  Laterality: N/A;  . ESOPHAGOGASTRODUODENOSCOPY    . JOINT REPLACEMENT    . LOOP RECORDER REMOVAL N/A 09/18/2017   Procedure: LOOP RECORDER REMOVAL;  Surgeon: Deboraha Sprang, MD;  Location: [place] CV LAB;  Service: Cardiovascular;  Laterality: N/A;  . RIGHT/LEFT HEART CATH AND CORONARY ANGIOGRAPHY N/A 01/28/2017   Procedure: Right/Left Heart Cath and Coronary Angiography;  Surgeon: Larey Dresser, MD;  Location: [place] CV LAB;  Service: Cardiovascular;  Laterality: N/A;  . TEE WITHOUT CARDIOVERSION  04/25/2012   Procedure: TRANSESOPHAGEAL ECHOCARDIOGRAM (TEE);  Surgeon: Thayer Headings, MD;  Location: [place];  Service: Cardiovascular;  Laterality: N/A;  . TEE WITHOUT CARDIOVERSION N/A 03/15/2015   Procedure: TRANSESOPHAGEAL ECHOCARDIOGRAM (TEE);  Surgeon: Thayer Headings, MD;  Location: [place];  Service: Cardiovascular;  Laterality: N/A;  . TONSILLECTOMY AND ADENOIDECTOMY    . TOTAL KNEE ARTHROPLASTY Right 06/15/2014   Procedure: TOTAL KNEE ARTHROPLASTY;  Surgeon: Renette Butters, MD;  Location: [place];  Service: Orthopedics;  Laterality: Right;    Current Outpatient Medications  Medication Sig Dispense Refill  . acetaminophen (TYLENOL) 500 MG tablet Take 500 mg by mouth every 6 (six) hours as needed.    [name] albuterol (VENTOLIN HFA) 108 (90 Base) MCG/ACT inhaler INHALE 2 PUFFS 4 TIMES A DAY as needed (Patient taking differently: Inhale 2 puffs into the lungs every 6 (six) hours as needed for wheezing  or shortness of breath. INHALE 2 PUFFS 4 TIMES A DAY as needed) 54 Inhaler 3  . ALPRAZolam (XANAX) 0.5 MG tablet TAKE 1 TO 2 TABLETS BY MOUTH EVERY DAY AT BEDTIME AS NEEDED FOR SLEEP (Patient taking differently: TAKE 1mg   BY MOUTH EVERY DAY AT BEDTIME) 180 tablet 1  . atorvastatin (LIPITOR) 20 MG tablet Take 20 mg by mouth daily.    . carisoprodol (SOMA) 350 MG tablet TAKE 1 TABLET BY MOUTH 3 TIMES A DAY AS NEEDED FOR MUSCLE SPASM 90 tablet 5  . carvedilol (COREG) 6.25 MG tablet Take 1.5 tablets (9.375 mg total) by mouth 2 (two) times daily with a meal. 90 tablet 3  . clobetasol ointment (TEMOVATE) 9.32 % Apply 1 application topically 2 (two) times daily. 60 g 1  . digoxin (LANOXIN) 0.25 MG tablet Take 0.5 tablets (0.125 mg total) by mouth daily. 30 tablet 11  . doxepin (SINEQUAN) 10 MG capsule Take 2 capsules (20 mg total) by mouth at bedtime. 180 capsule 0  . ezetimibe (ZETIA) 10 MG tablet Take 1 tablet (10 mg total) by mouth daily. 90 tablet 3  . fluticasone (FLONASE) 50 MCG/ACT nasal spray Place 2 sprays into both nostrils daily as needed for allergies or rhinitis. 18.2 g 5  . Fluticasone-Salmeterol (ADVAIR DISKUS) 250-50 MCG/DOSE AEPB Inhale 1 puff into the lungs 2 (two) times daily. (Patient taking differently: Inhale 1 puff into the lungs 2 (two) times daily as needed  (shortness of breath). ) 180 each 3  . hydrOXYzine (ATARAX/VISTARIL) 10 MG tablet Take 1 tablet (10 mg total) by mouth 3 (three) times daily as needed. 60 tablet 2  . losartan (COZAAR) 25 MG tablet Take 1 tablet (25 mg total) by mouth daily. 30 tablet 11  . omeprazole (PRILOSEC) 20 MG capsule Take 1 capsule (20 mg total) by mouth daily with supper. 90 capsule 1  . sertraline (ZOLOFT) 100 MG tablet TAKE 2 TABLETS BY MOUTH AT BEDTIME 180 tablet 3  . silver sulfADIAZINE (SILVADENE) 1 % cream APPLY TO AFFECTED AREA TOPICALLY EVERY DAY 50 g 3  . spironolactone (ALDACTONE) 25 MG tablet Take 1 tablet (25 mg total) by mouth daily. 30 tablet 3  . torsemide (DEMADEX) 20 MG tablet Take 2 tablets (40 mg total) by mouth 2 (two) times daily. 120 tablet 3  . traMADol (ULTRAM) 50 MG tablet TAKE 1 TABLET BY MOUTH EVERY 6 HOURS AS NEEDED (Patient taking differently: TAKE 50 mg  BY MOUTH EVERY 6 HOURS AS NEEDED for pain) 30 tablet 2  . TUMS 500 MG chewable tablet Chew 500-2,000 mg by mouth every 4 (four) hours as needed for indigestion or heartburn.     . valACYclovir (VALTREX) 1000 MG tablet Take 1 tablet (1,000 mg total) by mouth 3 (three) times daily. (Patient taking differently: Take 1,000 mg by mouth 3 (three) times daily as needed (cold sores). ) 21 tablet 2  . warfarin (COUMADIN) 5 MG tablet Take as directed by anticoagulation clinic (Patient taking differently: Take 2.5-5 mg by mouth See admin instructions. Take 2.5 mg on Mon / Fri  Take 5 mg on Sun / Tues / Wed/ Thurs/ Sat) 30 tablet 3  . zolpidem (AMBIEN) 10 MG tablet Take 1 tablet (10 mg total) by mouth at bedtime. 90 tablet 1   No current facility-administered medications for this visit.     Allergies  Allergen Reactions  . Amiodarone Other (See Comments)    hyperthyroidism  . Statins Other (See Comments)  myalgia  . Tape Other (See Comments)    Skin Tears Use Paper Tape Only    Review of Systems negative except from HPI and PMH  Physical  Exam BP 90/68   Pulse 74   Ht 5\' 10"  (1.778 m)   Wt 265 lb (120.2 kg)   SpO2 97%   BMI 38.02 kg/m  Well developed and nourished in no acute distress HENT normal Neck supple with JVP-flat Clear Device pocket well healed; without hematoma or erythema.  There is no tethering  Regular rate and rhythm, no murmurs or gallops Abd-soft with active BS No Clubbing cyanosis edema Skin-warm and dry A & Oriented  Grossly normal sensory and motor function    ECG ventricular pacing at 71 Intervals-/15/41 QRS duration Q QS in lead I and RS lead V1  -/19/44  Assessment and  Plan  Ischemic heart disease without significant obstructive disease by catheterization 6/17  HFrEF    CRT-D-Medtronic  Cardiomyopathy-ischemic/nonischemic  Atrial fibrillation  Permanent   Complete heart block status post AV junction ablation  Syncope     Atrial fibrillation is permanent.  He is status post AV junction ablation.  Heart rate excursion is well programmed off of his device.  Exercise tolerance is much improved.  We will plan an echocardiogram.  I will  asked that Dr. DM do it in heart failure clinic next week.  No bleeding issues  Last INR was 1.5;  we have had discussions in the past about going to [place].  Will defer these conversations to Dr. DM  We will check his laboratories today metabolic profile CBC and lipids  Cardiomyopathy continues to gradually worsen    .

## 2017-12-19 NOTE — Patient Instructions (Signed)
Description   Take 1/2 tablet today (since you only took 1 tablet yesterday) then continue taking 1 tablet daily except 1 and 1/2 tablets on Wednesdays.  Re-check in 2 weeks.

## 2017-12-20 LAB — CBC WITH DIFFERENTIAL/PLATELET
BASOS: 1 %
Basophils Absolute: 0 10*3/uL (ref 0.0–0.2)
EOS (ABSOLUTE): 0.3 10*3/uL (ref 0.0–0.4)
EOS: 4 %
HEMATOCRIT: 38 % (ref 37.5–51.0)
Hemoglobin: 12.8 g/dL — ABNORMAL LOW (ref 13.0–17.7)
Immature Grans (Abs): 0 10*3/uL (ref 0.0–0.1)
Immature Granulocytes: 1 %
LYMPHS ABS: 1.9 10*3/uL (ref 0.7–3.1)
Lymphs: 23 %
MCH: 33.2 pg — ABNORMAL HIGH (ref 26.6–33.0)
MCHC: 33.7 g/dL (ref 31.5–35.7)
MCV: 99 fL — ABNORMAL HIGH (ref 79–97)
MONOCYTES: 9 %
Monocytes Absolute: 0.7 10*3/uL (ref 0.1–0.9)
NEUTROS ABS: 5.4 10*3/uL (ref 1.4–7.0)
Neutrophils: 62 %
Platelets: 208 10*3/uL (ref 150–379)
RBC: 3.85 x10E6/uL — AB (ref 4.14–5.80)
RDW: 14 % (ref 12.3–15.4)
WBC: 8.4 10*3/uL (ref 3.4–10.8)

## 2017-12-20 LAB — COMPREHENSIVE METABOLIC PANEL
ALBUMIN: 4.4 g/dL (ref 3.6–4.8)
ALT: 35 IU/L (ref 0–44)
AST: 33 IU/L (ref 0–40)
Albumin/Globulin Ratio: 1.8 (ref 1.2–2.2)
Alkaline Phosphatase: 80 IU/L (ref 39–117)
BUN / CREAT RATIO: 20 (ref 10–24)
BUN: 20 mg/dL (ref 8–27)
Bilirubin Total: 0.4 mg/dL (ref 0.0–1.2)
CO2: 28 mmol/L (ref 20–29)
CREATININE: 0.98 mg/dL (ref 0.76–1.27)
Calcium: 9.3 mg/dL (ref 8.6–10.2)
Chloride: 99 mmol/L (ref 96–106)
GFR, EST AFRICAN AMERICAN: 94 mL/min/{1.73_m2} (ref >59)
GFR, EST NON AFRICAN AMERICAN: 82 mL/min/{1.73_m2} (ref >59)
GLUCOSE: 93 mg/dL (ref 65–99)
Globulin, Total: 2.4 g/dL (ref 1.5–4.5)
Potassium: 4.5 mmol/L (ref 3.5–5.2)
Sodium: 141 mmol/L (ref 134–144)
TOTAL PROTEIN: 6.8 g/dL (ref 6.0–8.5)

## 2017-12-20 LAB — LDL CHOLESTEROL, DIRECT: LDL Direct: 121 mg/dL — ABNORMAL HIGH (ref 0–99)

## 2017-12-21 ENCOUNTER — Other Ambulatory Visit: Payer: Self-pay | Admitting: Internal Medicine

## 2017-12-22 DIAGNOSIS — J449 Chronic obstructive pulmonary disease, unspecified: Secondary | ICD-10-CM | POA: Diagnosis not present

## 2017-12-23 DIAGNOSIS — L400 Psoriasis vulgaris: Secondary | ICD-10-CM | POA: Diagnosis not present

## 2017-12-25 ENCOUNTER — Telehealth (HOSPITAL_COMMUNITY): Payer: Self-pay | Admitting: Pharmacist

## 2017-12-25 ENCOUNTER — Ambulatory Visit (HOSPITAL_COMMUNITY)
Admission: RE | Admit: 2017-12-25 | Discharge: 2017-12-25 | Disposition: A | Discharge status: Home / Self Care | Payer: Medicare HMO | Source: Ambulatory Visit | Attending: Cardiology | Admitting: Cardiology

## 2017-12-25 VITALS — BP 93/58 | HR 75 | Wt 268.8 lb

## 2017-12-25 DIAGNOSIS — E785 Hyperlipidemia, unspecified: Secondary | ICD-10-CM | POA: Insufficient documentation

## 2017-12-25 DIAGNOSIS — I110 Hypertensive heart disease with heart failure: Secondary | ICD-10-CM | POA: Diagnosis not present

## 2017-12-25 DIAGNOSIS — I428 Other cardiomyopathies: Secondary | ICD-10-CM | POA: Diagnosis not present

## 2017-12-25 DIAGNOSIS — I442 Atrioventricular block, complete: Secondary | ICD-10-CM | POA: Insufficient documentation

## 2017-12-25 DIAGNOSIS — N400 Enlarged prostate without lower urinary tract symptoms: Secondary | ICD-10-CM | POA: Diagnosis not present

## 2017-12-25 DIAGNOSIS — I2510 Atherosclerotic heart disease of native coronary artery without angina pectoris: Secondary | ICD-10-CM | POA: Insufficient documentation

## 2017-12-25 DIAGNOSIS — K219 Gastro-esophageal reflux disease without esophagitis: Secondary | ICD-10-CM | POA: Diagnosis not present

## 2017-12-25 DIAGNOSIS — I5022 Chronic systolic (congestive) heart failure: Secondary | ICD-10-CM | POA: Insufficient documentation

## 2017-12-25 DIAGNOSIS — Z87891 Personal history of nicotine dependence: Secondary | ICD-10-CM | POA: Diagnosis not present

## 2017-12-25 DIAGNOSIS — I482 Chronic atrial fibrillation: Secondary | ICD-10-CM | POA: Insufficient documentation

## 2017-12-25 DIAGNOSIS — J449 Chronic obstructive pulmonary disease, unspecified: Secondary | ICD-10-CM | POA: Insufficient documentation

## 2017-12-25 DIAGNOSIS — Z9981 Dependence on supplemental oxygen: Secondary | ICD-10-CM | POA: Insufficient documentation

## 2017-12-25 DIAGNOSIS — L308 Other specified dermatitis: Secondary | ICD-10-CM | POA: Insufficient documentation

## 2017-12-25 DIAGNOSIS — Z6838 Body mass index (BMI) 38.0-38.9, adult: Secondary | ICD-10-CM | POA: Insufficient documentation

## 2017-12-25 DIAGNOSIS — Z95810 Presence of automatic (implantable) cardiac defibrillator: Secondary | ICD-10-CM | POA: Insufficient documentation

## 2017-12-25 DIAGNOSIS — Z79899 Other long term (current) drug therapy: Secondary | ICD-10-CM | POA: Insufficient documentation

## 2017-12-25 DIAGNOSIS — I4891 Unspecified atrial fibrillation: Secondary | ICD-10-CM | POA: Diagnosis not present

## 2017-12-25 DIAGNOSIS — Z7901 Long term (current) use of anticoagulants: Secondary | ICD-10-CM | POA: Insufficient documentation

## 2017-12-25 MED ORDER — ATORVASTATIN CALCIUM 40 MG PO TABS: 40.0000 mg | ORAL_TABLET | Freq: Every day | ORAL | 3 refills | Status: DC

## 2017-12-25 NOTE — Progress Notes (Signed)
PCP: Dr. [name] EP: [name] HF Cardiology: [name]  62 y.o. with history of COPD on home oxygen, permanent atrial fibrillation, and chronic systolic CHF was referred by Dr. [name] for evaluation of CHF.  Amiodarone and DCCV were tried for atrial fibrillation in the past without success.   Patient has been noted to have a low EF since 2013.  EF improved by to normal by 2015 but dropped to 30-35% by 11/16.  Cath in 6/17 showed some CAD but not enough to cause cardiomyopathy.  He was admitted in 3/18 with PNA, atrial fibrillation/RVR, and acute on chronic systolic CHF.  Echo in 3/18 showed fall in EF to 10-15%.   I took him for right and left heart cath in 4/18, which showed nonobstructive CAD and relatively optimized filling pressures.    Syncope in 11/18. Loop recorder showed 6 second pause and periods of complete heart block. He had Medtronic BiV ICD placed in 12/18.  In 1/19, he had AV nodal ablation to promote BiV pacing.   Patient is seen today in followup of CHF/dyspnea. He is using oxygen only at night now for the most part. He has been sleeping in a recliner long-term.  Has OSA but cannot tolerate CPAP.  Weight is up, he attributes this to taking a higher dose of steroids for his skin condition. He also started on MTX for his skin. He is short of breath walking up a flight of steps.  No dyspnea walking on flat ground.  Gets lightheaded on occasion with rapid standing.    Labs (4/18): pro-BNP 904, K 3.1, creatinine 1.39 => 1.08 with BUN 66 => 37, hgb 14.1.  Labs (5/18): hemoglobin 13.1 Labs (6/18): K 4, creatinine 1.3, BNP 294 Labs (7/18): K 3.8, creatinine 1.23, LDL 96, HDL 42 Labs (8/18): K 4.5, creatinine 1.22 Labs (10/18): digoxin 0.4 Labs (12/18): LDL 115, HDL 37, TGs 214, K 4.2, creatinine 0.84 Labs (1/19): K 4.4, creatinine 0.75, pro-BNP 433 Labs (2/19): digoxin 0.4 Labs (3/19): LDL 121, hgb 12.8, K 4.5, creatinine 0.98  Medtronic device interrogation: >99% BiV paced, fluid index <  threshold with stable thoracic impedance.   PMH: 1. Asthma 2. COPD: On home oxygen. Prior smoker.  3. Atrial fibrillation: Permanent.  He failed DCCV and amiodarone in the past . 4. HTN 5. GERD 6. Hyperlipidemia 7. BPH.  8. Chronic systolic CHF: Nonischemic cardiomyopathy.  - EF 25% by TEE in 7/13.  Normalized on 2015 echo. - Echo (11/16): EF 30-35%.  - LHC/RHC (6/17): 80% ostial stenosis small D1, 30-40% pLAD.  Mean RA 4, PA 26/10, mean PCWP 11, CI 2.33.  - Echo (3/18): EF 10-15%, diffuse hypokinesis, severe LV dilation, moderate RV dilation with severely decreased systolic function.  - LHC/RHC (4/18): 40% proximal LAD.  Mean RA 8, PA 37/10 mean 22, mean PCWP 22, LVEDP 9, CI 2.39. - Medtronic BiV ICD placement in 12/18 with AV nodal ablation to promote BiV pacing in 1/19.  9. Morbid obesity 10. ILR in place.  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 Complete heart block: s/p Medtronic CRT-D.   Social History   Socioeconomic History  . Marital status: Divorced    Spouse name: Not on file  . Number of children: 2  . Years of education: Not on file  . Highest education level: Not on file  Social Needs  . Financial resource strain: Not on file  . Food insecurity - worry: Not on file  . Food insecurity - inability: Not on file  .  Transportation needs - medical: Not on file  . Transportation needs - non-medical: Not on file  Occupational History  . Occupation: retired/disabled. prev worked in Therapist, sports.    Employer: DISABLED  Tobacco Use  . Smoking status: Former Smoker    Packs/day: 2.00    Years: 30.00    Pack years: 60.00    Types: Cigarettes    Last attempt to quit: 10/16/2007    Years since quitting: 10.2  . Smokeless tobacco: Never Used  Substance and Sexual Activity  . Alcohol use: No  . Drug use: No  . Sexual activity: Not Currently  Other Topics Concern  . Not on file  Social History Narrative   Lives alone.   Family History  Problem Relation Age of  Onset  . COPD Mother   . Asthma Mother   . Colon polyps Mother   . Allergies Mother   . Hypothyroidism Mother   . Asthma Maternal Grandmother   . Colon cancer Neg Hx    ROS: All systems reviewed and negative except as per HPI.   Current Outpatient Medications  Medication Sig Dispense Refill  . acetaminophen (TYLENOL) 500 MG tablet Take 500 mg by mouth every 6 (six) hours as needed.    [name] albuterol (VENTOLIN HFA) 108 (90 Base) MCG/ACT inhaler INHALE 2 PUFFS 4 TIMES A DAY as needed (Patient taking differently: Inhale 2 puffs into the lungs every 6 (six) hours as needed for wheezing or shortness of breath. INHALE 2 PUFFS 4 TIMES A DAY as needed) 54 Inhaler 3  . ALPRAZolam (XANAX) 0.5 MG tablet TAKE 1 TO 2 TABLETS BY MOUTH EVERY DAY AT BEDTIME AS NEEDED FOR SLEEP (Patient taking differently: TAKE 1mg   BY MOUTH EVERY DAY AT BEDTIME) 180 tablet 1  . atorvastatin (LIPITOR) 40 MG tablet Take 1 tablet (40 mg total) by mouth daily. 30 tablet 3  . carisoprodol (SOMA) 350 MG tablet TAKE 1 TABLET BY MOUTH 3 TIMES A DAY AS NEEDED FOR MUSCLE SPASM 90 tablet 5  . carvedilol (COREG) 6.25 MG tablet Take 1.5 tablets (9.375 mg total) by mouth 2 (two) times daily with a meal. 90 tablet 3  . clobetasol ointment (TEMOVATE) 6.23 % Apply 1 application topically 2 (two) times daily. 60 g 1  . digoxin (LANOXIN) 0.25 MG tablet Take 0.5 tablets (0.125 mg total) by mouth daily. 30 tablet 11  . doxepin (SINEQUAN) 10 MG capsule Take 2 capsules (20 mg total) by mouth at bedtime. 180 capsule 0  . ezetimibe (ZETIA) 10 MG tablet Take 1 tablet (10 mg total) by mouth daily. 90 tablet 3  . fluticasone (FLONASE) 50 MCG/ACT nasal spray Place 2 sprays into both nostrils daily as needed for allergies or rhinitis. 18.2 g 5  . Fluticasone-Salmeterol (ADVAIR DISKUS) 250-50 MCG/DOSE AEPB Inhale 1 puff into the lungs 2 (two) times daily. (Patient taking differently: Inhale 1 puff into the lungs 2 (two) times daily as needed (shortness of  breath). ) 180 each 3  . hydrOXYzine (ATARAX/VISTARIL) 10 MG tablet Take 1 tablet (10 mg total) by mouth 3 (three) times daily as needed. 60 tablet 2  . losartan (COZAAR) 25 MG tablet Take 1 tablet (25 mg total) by mouth daily. 30 tablet 11  . methotrexate (RHEUMATREX) 2.5 MG tablet TAKE 4 TABS EVERY WEEK (Patient taking differently: TAKE 5 TABS EVERY WEEK) 16 tablet 0  . omeprazole (PRILOSEC) 20 MG capsule Take 1 capsule (20 mg total) by mouth daily with supper. 90 capsule 1  .  sertraline (ZOLOFT) 100 MG tablet TAKE 2 TABLETS BY MOUTH AT BEDTIME 180 tablet 3  . silver sulfADIAZINE (SILVADENE) 1 % cream APPLY TO AFFECTED AREA TOPICALLY EVERY DAY 50 g 3  . spironolactone (ALDACTONE) 25 MG tablet Take 1 tablet (25 mg total) by mouth daily. 30 tablet 3  . torsemide (DEMADEX) 20 MG tablet Take 2 tablets (40 mg total) by mouth 2 (two) times daily. 120 tablet 3  . traMADol (ULTRAM) 50 MG tablet TAKE 1 TABLET BY MOUTH EVERY 6 HOURS AS NEEDED (Patient taking differently: TAKE 50 mg  BY MOUTH EVERY 6 HOURS AS NEEDED for pain) 30 tablet 2  . TUMS 500 MG chewable tablet Chew 500-2,000 mg by mouth every 4 (four) hours as needed for indigestion or heartburn.     . valACYclovir (VALTREX) 1000 MG tablet Take 1 tablet (1,000 mg total) by mouth 3 (three) times daily. (Patient taking differently: Take 1,000 mg by mouth 3 (three) times daily as needed (cold sores). ) 21 tablet 2  . warfarin (COUMADIN) 5 MG tablet Take as directed by anticoagulation clinic (Patient taking differently: Take 2.5-5 mg by mouth See admin instructions. Take 2.5 mg on Mon / Fri  Take 5 mg on Sun / Tues / Wed/ Thurs/ Sat) 30 tablet 3  . zolpidem (AMBIEN) 10 MG tablet Take 1 tablet (10 mg total) by mouth at bedtime. 90 tablet 1   No current facility-administered medications for this encounter.    BP (!) 93/58   Pulse 75   Wt 268 lb 12.8 oz (121.9 kg)   SpO2 99%   BMI 38.57 kg/m  General: NAD General: NAD Neck: No JVD, no thyromegaly  or thyroid nodule.  Lungs: Clear to auscultation bilaterally with normal respiratory effort. CV: Nondisplaced PMI.  Heart regular S1/S2, no S3/S4, no murmur.  No peripheral edema.  No carotid bruit.  Normal pedal pulses.  Abdomen: Soft, nontender, no hepatosplenomegaly, no distention.  Skin: Intact without lesions or rashes.  Neurologic: Alert and oriented x 3.  Psych: Normal affect. Extremities: No clubbing or cyanosis.  HEENT: Normal.   Assessment/Plan:  1. Atrial fibrillation: Permanent.  Now s/p AV nodal ablation to allow effective BiV pacing.  - Continue Coreg.  - We discussed switching from warfarin to DOAC, but it appears that DOACs would end up being too expensive for him.  2. COPD: No longer smoking.  He is on home oxygen at night.   3. Chronic systolic CHF: Nonischemic cardiomyopathy based on 6/17 cath. However, he had a recent significant fall in EF from 30-35% to 10-15% on last echo from 3/18.  LHC/RHC in 4/18 showed nonobstructive CAD and relatively optimized filling pressures.  He now has Medtronic CRT-D device with AV nodal ablation to allow BiV pacing.  He is not volume overloaded by exam or Optivol.  No BP room with SBP 90s at home to increase meds.  - Continue Coreg 9.375 mg bid.  - Continue spironolactone 25 mg daily.  - Continue torsemide 40 mg bid.       - Continue losartan 25 mg daily. - Continue digoxin 0.125 daily.     - I will arrange for repeat echo.  4. CAD: Nonobstructive on 4/18 cath.  No exertional chest pain.  - He is on warfarin so no ASA.  - Myalgias with statins in the past but he is tolerating atorvastatin.  LDL was still high when recently checked, will increase atorvastatin to 40 mg daily with lipids/LFTs in 2 months.  5. Complete heart block: Now has Medtronic CRT-D device and s/p AV nodal ablation.  6. Rash: Spongiotic dermatitis.  Now on steroids and methotrexate.   Followup 3 months   Loralie Champagne 12/25/2017

## 2017-12-25 NOTE — Telephone Encounter (Signed)
Verified with [employer] that if Mr. Eisemann were to switch to Eliquis his copay would be $47/mo and at this time, he does not feel that he would be able to afford this. Will continue on warfarin for his afib for the time being.   Ruta Hinds. Velva Harman, PharmD, BCPS, CPP Clinical Pharmacist Phone: [phone] 12/25/2017 10:21 AM

## 2017-12-25 NOTE — Patient Instructions (Signed)
Increase Atorvastatin 40 mg (1 tab) daily  Erika pharm D will call about switching from Coumadin to Eliquis   Your physician has requested that you have an echocardiogram. Echocardiography is a painless test that uses sound waves to create images of your heart. It provides your doctor with information about the size and shape of your heart and how well your heart's chambers and valves are working. This procedure takes approximately one hour. There are no restrictions for this procedure.  Your physician recommends that you schedule a follow-up appointment in: 3 months with Dr. [name]  an a echocardiogram

## 2018-01-01 ENCOUNTER — Other Ambulatory Visit: Payer: Self-pay | Admitting: Internal Medicine

## 2018-01-02 NOTE — Telephone Encounter (Signed)
Done erx 

## 2018-01-02 NOTE — Telephone Encounter (Signed)
Last filled 12/08/2017

## 2018-01-03 ENCOUNTER — Ambulatory Visit: Payer: Medicare HMO

## 2018-01-07 ENCOUNTER — Ambulatory Visit (INDEPENDENT_AMBULATORY_CARE_PROVIDER_SITE_OTHER): Payer: Medicare HMO | Admitting: General Practice

## 2018-01-07 DIAGNOSIS — Z7901 Long term (current) use of anticoagulants: Secondary | ICD-10-CM | POA: Diagnosis not present

## 2018-01-07 DIAGNOSIS — I4891 Unspecified atrial fibrillation: Secondary | ICD-10-CM

## 2018-01-07 LAB — POCT INR: INR: 2.1

## 2018-01-07 NOTE — Patient Instructions (Addendum)
Pre visit review using our clinic review tool, if applicable. No additional management support is needed unless otherwise documented below in the visit note.  Continue taking 1 tablet daily except 1 1/2 tablets on Wednesdays.  Re-check in 4 weeks.

## 2018-01-10 DIAGNOSIS — G4733 Obstructive sleep apnea (adult) (pediatric): Secondary | ICD-10-CM | POA: Diagnosis not present

## 2018-01-13 NOTE — Progress Notes (Signed)
[name] [name], [name] [address] Phone: [phone] Subjective:     CC: Right shoulder pain  [id number]  [name] is a 62 y.o. male coming in with complaint of right shoulder. States his thumb goes numb. No neck pain. Pain wakes him up at night.   Onset- Chronic  Location- anterior joint pain  Duration-seems to be more continuous now Character-aching sensation in all time with a sharp pain. Aggravating factors- movement, putting clothes on Reliving factors-  Therapies tried- Ice, heat, topical, oral Severity-8 out of 10     Past Medical History:  Diagnosis Date  . AICD (automatic cardioverter/defibrillator) present   . Anemia    supposed to be taking Vit B but doesn't  . ANXIETY    takes Xanax nightly  . Arthritis   . Asthma    Albuterol prn and Advair daily;also takes Prednisone daily  . Atrial fibrillation ([place]) 09/06/2015  . Cardiomyopathy [facility])    a. EF 25% TEE July 2013; b. EF normalized 2015;  c. 03/2015 Echo: EF 40-45%, difrf HK, PASP 38 mmHg, Mild MR, sev LAE/RAE.  [name] Chronic constipation    takes OTC stool softener  . COPD (chronic obstructive pulmonary disease) ([place])    "one dr says COPD; one dr says emphysema" (09/18/2017)  . DEPRESSION    takes Zoloft and Doxepin daily  . Diverticulitis   . DYSKINESIA, ESOPHAGUS   . Emphysema of lung [facility])    "one dr says COPD; one dr says emphysema" (09/18/2017)  . Essential hypertension       . FIBROMYALGIA   . GERD (gastroesophageal reflux disease)       . Glaucoma   . HYPERLIPIDEMIA    a. Intolerant to statins.  . INSOMNIA    takes Ambien nightly  . Myocardial infarction [facility])    a. 2012 Myoview notable for prior infarct;  b. 03/2015 Lexiscan CL: EF 37%, diff HK, small area of inferior infarct from apex to base-->Med Rx.  . Myocardial infarction ([place])   . O2 dependent    "2.5L q hs & prn" (09/18/2017)  . Paroxysmal atrial fibrillation (HCC)    a.  CHA2DS2VASc = 3--> takes Coumadin;  b. 03/15/2015 Successful TEE/DCCV;  c. 03/2015 recurrent afib, Amio d/c'd in setting of hyperthyroidism.  . Peripheral neuropathy   . Pneumonia 12/2016  . Rash and other nonspecific skin eruption 04/12/2009   no cause found saw dermatologists x 2 and allergist  . SLEEP APNEA, OBSTRUCTIVE    a. doesn't use CPAP  . Syncope    a. 03/2015 s/p MDT LINQ.  [name] Type II diabetes mellitus ([place])        Past Surgical History:  Procedure Laterality Date  . ACNE CYST REMOVAL     2 on back   . AV NODE ABLATION N/A 10/25/2017   Procedure: AV NODE ABLATION;  Surgeon: Deboraha Sprang, MD;  Location: [place] CV [facility];  Service: Cardiovascular;  Laterality: N/A;  . BIV ICD INSERTION CRT-D N/A 09/18/2017   Procedure: BIV ICD INSERTION CRT-D;  Surgeon: Deboraha Sprang, MD;  Location: [place] CV LAB;  Service: Cardiovascular;  Laterality: N/A;  . CARDIAC CATHETERIZATION N/A 03/21/2016   Procedure: Right/Left Heart Cath and Coronary Angiography;  Surgeon: Larey Dresser, MD;  Location: [place] [facility];  Service: Cardiovascular;  Laterality: N/A;  . CARDIOVERSION  04/18/2012   Procedure: CARDIOVERSION;  Surgeon: Fay Records, MD;  Location: [place];  Service: Cardiovascular;  Laterality: N/A;  . CARDIOVERSION  04/25/2012   Procedure: CARDIOVERSION;  Surgeon: Thayer Headings, MD;  Location: [place];  Service: Cardiovascular;  Laterality: N/A;  . CARDIOVERSION  04/25/2012   Procedure: CARDIOVERSION;  Surgeon: Fay Records, MD;  Location: [place];  Service: Cardiovascular;  Laterality: N/A;  . CARDIOVERSION  05/09/2012   Procedure: CARDIOVERSION;  Surgeon: Sherren Mocha, MD;  Location: [place];  Service: Cardiovascular;  Laterality: N/A;  changed from crenshaw to cooper by trish/leone-endo  . CARDIOVERSION N/A 03/15/2015   Procedure: CARDIOVERSION;  Surgeon: Thayer Headings, MD;  Location: [facility] ENDOSCOPY;  Service: Cardiovascular;  Laterality: N/A;  . COLONOSCOPY    . COLONOSCOPY  WITH PROPOFOL N/A 10/21/2014   Procedure: COLONOSCOPY WITH PROPOFOL;  Surgeon: Ladene Artist, MD;  Location: [facility] ENDOSCOPY;  Service: Endoscopy;  Laterality: N/A;  . EP IMPLANTABLE DEVICE N/A 04/06/2015   Procedure: Loop Recorder Insertion;  Surgeon: Evans Lance, MD;  Location: [place] CV LAB;  Service: Cardiovascular;  Laterality: N/A;  . ESOPHAGOGASTRODUODENOSCOPY    . JOINT REPLACEMENT    . LOOP RECORDER REMOVAL N/A 09/18/2017   Procedure: LOOP RECORDER REMOVAL;  Surgeon: Deboraha Sprang, MD;  Location: [place] [facility];  Service: Cardiovascular;  Laterality: N/A;  . RIGHT/LEFT HEART CATH AND CORONARY ANGIOGRAPHY N/A 01/28/2017   Procedure: Right/Left Heart Cath and Coronary Angiography;  Surgeon: Larey Dresser, MD;  Location: [place] [facility];  Service: Cardiovascular;  Laterality: N/A;  . TEE WITHOUT CARDIOVERSION  04/25/2012   Procedure: TRANSESOPHAGEAL ECHOCARDIOGRAM (TEE);  Surgeon: Thayer Headings, MD;  Location: [place];  Service: Cardiovascular;  Laterality: N/A;  . TEE WITHOUT CARDIOVERSION N/A 03/15/2015   Procedure: TRANSESOPHAGEAL ECHOCARDIOGRAM (TEE);  Surgeon: Thayer Headings, MD;  Location: [place];  Service: Cardiovascular;  Laterality: N/A;  . TONSILLECTOMY AND ADENOIDECTOMY    . TOTAL KNEE ARTHROPLASTY Right 06/15/2014   Procedure: TOTAL KNEE ARTHROPLASTY;  Surgeon: Renette Butters, MD;  Location: [place];  Service: Orthopedics;  Laterality: Right;   Social History   Socioeconomic History  . Marital status: Divorced    Spouse name: Not on file  . Number of children: 2  . Years of education: Not on file  . Highest education level: Not on file  Occupational History  . Occupation: retired/disabled. prev worked in Therapist, sports.    Employer: DISABLED  Social Needs  . Financial resource strain: Not on file  . Food insecurity:    Worry: Not on file    Inability: Not on file  . Transportation needs:    Medical: Not on file    Non-medical: Not on  file  Tobacco Use  . Smoking status: Former Smoker    Packs/day: 2.00    Years: 30.00    Pack years: 60.00    Types: Cigarettes    Last attempt to quit: 10/16/2007    Years since quitting: 10.2  . Smokeless tobacco: Never Used  Substance and Sexual Activity  . Alcohol use: No  . Drug use: No  . Sexual activity: Not Currently  Lifestyle  . Physical activity:    Days per week: Not on file    Minutes per session: Not on file  . Stress: Not on file  Relationships  . Social connections:    Talks on phone: Not on file    Gets together: Not on file    Attends religious service: Not on file    Active member of club  or organization: Not on file    Attends meetings of clubs or organizations: Not on file    Relationship status: Not on file  Other Topics Concern  . Not on file  Social History Narrative   Lives alone.   Allergies  Allergen Reactions  . Amiodarone Other (See Comments)    hyperthyroidism  . Statins Other (See Comments)    myalgia  . Tape Other (See Comments)    Skin Tears Use Paper Tape Only   Family History  Problem Relation Age of Onset  . COPD Mother   . Asthma Mother   . Colon polyps Mother   . Allergies Mother   . Hypothyroidism Mother   . Asthma Maternal Grandmother   . Colon cancer Neg Hx      Past medical history, social, surgical and family history all reviewed in electronic medical record.  No pertanent information unless stated regarding to the chief complaint.   Review of Systems:Review of systems updated and as accurate as of 01/14/18  No headache, visual changes, nausea, vomiting, diarrhea, constipation, dizziness, abdominal pain, skin rash, fevers, chills, night sweats, weight loss, swollen lymph nodes, body aches, joint swelling, muscle aches, chest pain, shortness of breath, mood changes.    Objective  Blood pressure 114/70, pulse 72, height 5\' 10"  (1.778 m), weight 272 lb (123.4 kg), SpO2 97 %. Systems examined below as of 01/14/18     General: No apparent distress alert and oriented x3 mood and affect normal, dressed appropriately.  HEENT: Pupils equal, extraocular movements intact  Respiratory: Patient's speak in full sentences and does not appear short of breath  Cardiovascular: No lower extremity edema, non tender, no erythema  Skin: Warm dry intact with no signs of infection or rash on extremities or on axial skeleton.  Abdomen: Soft nontender  Neuro: Cranial nerves II through XII are intact, neurovascularly intact in all extremities with 2+ DTRs and 2+ pulses.  Lymph: No lymphadenopathy of posterior or anterior cervical chain or axillae bilaterally.  Gait normal with good balance and coordination.  MSK:  Non tender with full range of motion and good stability and symmetric strength and tone of shoulders, elbows, wrist, hip, knee and ankles bilaterally.  Shoulder: Right Inspection reveals no abnormalities, atrophy or asymmetry. Palpation is normal with no tenderness over AC joint or bicipital groove. ROM limited actively secondary to weakness. Rotator cuff 3+ out of 5 compared to contralateral side Positive impingement Positive O'Brien's Normal scapular function observed. Painful arc No apprehension sign   Contralateral shoulder has mild impingement but no weakness. MSK US performed of: Right This study was ordered, performed, and interpreted by Charlann Boxer D.O.  Shoulder:   Supraspinatus: Large degenerative tear noted with 1.5 cm of retraction I Subscapularis: Degenerative changes noted but no retraction Teres Minor:  Appears normal on long and transverse views. AC joint: Arthritic changes noted Glenohumeral Joint: Mild to moderate arthritic changes. Glenoid Labrum:  Intact without visualized tears. Biceps Tendon:  Appears normal on long and transverse views, no fraying of tendon, tendon located in intertubercular groove, no subluxation with shoulder internal or external rotation.  Impression: Rotator  cuff arthropathy  Procedure: Real-time Ultrasound Guided Injection of right glenohumeral joint Device: GE Logiq E  Ultrasound guided injection is preferred based studies that show increased duration, increased effect, greater accuracy, decreased procedural pain, increased response rate with ultrasound guided versus blind injection.  Verbal informed consent obtained.  Time-out conducted.  Noted no overlying erythema, induration, or other signs of  local infection.  Skin prepped in a sterile fashion.  Local anesthesia: Topical Ethyl chloride.  With sterile technique and under real time ultrasound guidance:  Joint visualized.  23g 1  inch needle inserted posterior approach. Pictures taken for needle placement. Patient did have injection of 2 cc of 1% lidocaine, 2 cc of 0.5% Marcaine, and 1.0 cc of Kenalog 40 mg/dL. Completed without difficulty  Pain immediately resolved suggesting accurate placement of the medication.  Advised to call if fevers/chills, erythema, induration, drainage, or persistent bleeding.  Images permanently stored and available for review in the ultrasound unit.  Impression: Technically successful ultrasound guided injection.    Impression and Recommendations:     This case required medical decision making of moderate complexity.      Note: This dictation was prepared with Dragon dictation along with smaller phrase technology. Any transcriptional errors that result from this process are unintentional.

## 2018-01-14 ENCOUNTER — Encounter: Payer: Self-pay | Admitting: Family Medicine

## 2018-01-14 ENCOUNTER — Telehealth (HOSPITAL_COMMUNITY): Payer: Self-pay

## 2018-01-14 ENCOUNTER — Ambulatory Visit: Payer: Medicare HMO | Admitting: Family Medicine

## 2018-01-14 ENCOUNTER — Ambulatory Visit: Payer: Self-pay

## 2018-01-14 ENCOUNTER — Ambulatory Visit (INDEPENDENT_AMBULATORY_CARE_PROVIDER_SITE_OTHER)
Admission: RE | Admit: 2018-01-14 | Discharge: 2018-01-14 | Disposition: A | Discharge status: Home / Self Care | Payer: Medicare HMO | Source: Ambulatory Visit | Attending: Family Medicine | Admitting: Family Medicine

## 2018-01-14 VITALS — BP 114/70 | HR 72 | Ht 70.0 in | Wt 272.0 lb

## 2018-01-14 DIAGNOSIS — M75101 Unspecified rotator cuff tear or rupture of right shoulder, not specified as traumatic: Secondary | ICD-10-CM | POA: Diagnosis not present

## 2018-01-14 DIAGNOSIS — M25511 Pain in right shoulder: Secondary | ICD-10-CM | POA: Diagnosis not present

## 2018-01-14 DIAGNOSIS — G8929 Other chronic pain: Secondary | ICD-10-CM

## 2018-01-14 DIAGNOSIS — M12811 Other specific arthropathies, not elsewhere classified, right shoulder: Secondary | ICD-10-CM

## 2018-01-14 DIAGNOSIS — J42 Unspecified chronic bronchitis: Secondary | ICD-10-CM | POA: Diagnosis not present

## 2018-01-14 NOTE — Patient Instructions (Signed)
Good to see you  Rotator cuff arthropathy  Ice 20 minutes 2 times daily. Usually after activity and before bed. Exercises 3 times a week.  Xrays downstairs Keep hands within peripheral vision  pennsaid pinkie amount topically 2 times daily as needed.   See me again in 4 weeks

## 2018-01-14 NOTE — Assessment & Plan Note (Signed)
Patient given injection.  Tolerated the procedure well.  I do believe that there is rotator cuff arthropathy.  Differential includes worsening of the pleural effusion.  X-rays ordered today.  Home exercises given, topical anti-inflammatories given.  Discussed which activities to do which wants to avoid.  Follow-up again in 4 weeks

## 2018-01-14 NOTE — Telephone Encounter (Signed)
Mr. Eichelberger understands his diseases/illness and has been taking his meds without difficulty since he's started the [facility] program.  Pt had issues with depression and severe itching.  Most recently pt has gone to another physician whom has given him medication to prevent the itchyness.  He stated that this new medication has made a world of difference.  He also stated that he has more energy since he has had the pacemaker.  Pt is more active than before and seems to be in good spirits the past few times that we have spoken.    Pt has no issues with obtaining his meds and he has a pcp.  Based on this information pt no longer [facility].  He was advised to call if needed.

## 2018-01-16 ENCOUNTER — Telehealth (HOSPITAL_COMMUNITY): Payer: Self-pay | Admitting: Surgery

## 2018-01-16 NOTE — Telephone Encounter (Signed)
Patient will be discharged from the [place] secondary to completion of the program.  He will continue to follow in the [facility] and will call with any concerns or questions regarding his HF.

## 2018-01-17 ENCOUNTER — Other Ambulatory Visit: Payer: Self-pay | Admitting: Internal Medicine

## 2018-01-22 DIAGNOSIS — J449 Chronic obstructive pulmonary disease, unspecified: Secondary | ICD-10-CM | POA: Diagnosis not present

## 2018-02-01 ENCOUNTER — Other Ambulatory Visit: Payer: Self-pay | Admitting: Allergy and Immunology

## 2018-02-01 DIAGNOSIS — Z8709 Personal history of other diseases of the respiratory system: Secondary | ICD-10-CM

## 2018-02-01 DIAGNOSIS — J3089 Other allergic rhinitis: Secondary | ICD-10-CM

## 2018-02-04 ENCOUNTER — Ambulatory Visit (INDEPENDENT_AMBULATORY_CARE_PROVIDER_SITE_OTHER): Payer: Medicare HMO | Admitting: General Practice

## 2018-02-04 DIAGNOSIS — Z7901 Long term (current) use of anticoagulants: Secondary | ICD-10-CM | POA: Diagnosis not present

## 2018-02-04 LAB — POCT INR: INR: 2.5

## 2018-02-04 NOTE — Patient Instructions (Addendum)
Pre visit review using our clinic review tool, if applicable. No additional management support is needed unless otherwise documented below in the visit note.  Continue taking 1 tablet daily except 1 1/2 tablets on Wednesdays.  Re-check in 4 weeks.

## 2018-02-06 ENCOUNTER — Other Ambulatory Visit (HOSPITAL_COMMUNITY): Payer: Self-pay | Admitting: Cardiology

## 2018-02-08 ENCOUNTER — Other Ambulatory Visit: Payer: Self-pay | Admitting: Allergy and Immunology

## 2018-02-08 DIAGNOSIS — Z8709 Personal history of other diseases of the respiratory system: Secondary | ICD-10-CM

## 2018-02-08 DIAGNOSIS — J3089 Other allergic rhinitis: Secondary | ICD-10-CM

## 2018-02-10 DIAGNOSIS — G4733 Obstructive sleep apnea (adult) (pediatric): Secondary | ICD-10-CM | POA: Diagnosis not present

## 2018-02-10 NOTE — Telephone Encounter (Signed)
Courtesy refill  

## 2018-02-13 ENCOUNTER — Ambulatory Visit: Payer: Medicare HMO | Admitting: Family Medicine

## 2018-02-13 ENCOUNTER — Encounter: Payer: Self-pay | Admitting: Family Medicine

## 2018-02-13 DIAGNOSIS — Z96651 Presence of right artificial knee joint: Secondary | ICD-10-CM

## 2018-02-13 DIAGNOSIS — M170 Bilateral primary osteoarthritis of knee: Secondary | ICD-10-CM | POA: Diagnosis not present

## 2018-02-13 DIAGNOSIS — M12812 Other specific arthropathies, not elsewhere classified, left shoulder: Secondary | ICD-10-CM | POA: Diagnosis not present

## 2018-02-13 DIAGNOSIS — M12811 Other specific arthropathies, not elsewhere classified, right shoulder: Secondary | ICD-10-CM | POA: Diagnosis not present

## 2018-02-13 DIAGNOSIS — M75101 Unspecified rotator cuff tear or rupture of right shoulder, not specified as traumatic: Secondary | ICD-10-CM | POA: Diagnosis not present

## 2018-02-13 DIAGNOSIS — T8484XA Pain due to internal orthopedic prosthetic devices, implants and grafts, initial encounter: Secondary | ICD-10-CM

## 2018-02-13 DIAGNOSIS — M1712 Unilateral primary osteoarthritis, left knee: Secondary | ICD-10-CM

## 2018-02-13 NOTE — Assessment & Plan Note (Signed)
Stable from previous exam.

## 2018-02-13 NOTE — Patient Instructions (Signed)
Good to see you  Injected the other shoulder  Ice 20 minutes 2 times daily. Usually after activity and before bed. pennsaid pinkie amount topically 2 times daily as needed.  Injected the shoulder and the knee.  See me again in 4 weeks and if not better we can try AC injection

## 2018-02-13 NOTE — Assessment & Plan Note (Signed)
Injection given.  Discussed icing regimen and home exercises.  Discussed with activities of doing which wants to avoid.  Patient will increase activity as tolerated.  Follow-up again in 10 weeks for this problem.

## 2018-02-13 NOTE — Assessment & Plan Note (Signed)
Patient given the injection and tolerated the procedure well.  Discussed icing regimen and home exercises.  Discussed which activities of doing which wants to avoid.  Patient is to increase activity as tolerated.  Follow-up with me again in 4 to 8 weeks

## 2018-02-13 NOTE — Progress Notes (Addendum)
[name] [address] [place], [place] [address] Phone: [phone] Subjective:    I'm seeing this patient by the request  of:    CC: Left shoulder pain, left knee pain  [id number]  [name] is a 62 y.o. male coming in with complaint of left shoulder pain.  Patient was seen previously for right shoulder pain and was found to have rotator cuff arthropathy.  Patient states that his left shoulder feels very similar.  Giving him significant amount pain.  Describes the pain as a dull, throbbing aching sensation.  Rates the severity pain is 9 out of 10   Patient is also having left knee pain.  Seen another provider for this previously.  Has a right knee replacement.  Told he has bone-on-bone.  Patient is having increasing instability.  Significant tightness noted.  Swelling noted.  Patient feels stable from time to time.    Past Medical History:  Diagnosis Date  . AICD (automatic cardioverter/defibrillator) present   . Anemia    supposed to be taking Vit B but doesn't  . ANXIETY    takes Xanax nightly  . Arthritis   . Asthma    Albuterol prn and Advair daily;also takes Prednisone daily  . Atrial fibrillation ([place]) 09/06/2015  . Cardiomyopathy [facility])    a. EF 25% TEE July 2013; b. EF normalized 2015;  c. 03/2015 Echo: EF 40-45%, difrf HK, PASP 38 mmHg, Mild MR, sev LAE/RAE.  [name] Chronic constipation    takes OTC stool softener  . COPD (chronic obstructive pulmonary disease) ([place])    "one dr says COPD; one dr says emphysema" (09/18/2017)  . DEPRESSION    takes Zoloft and Doxepin daily  . Diverticulitis   . DYSKINESIA, ESOPHAGUS   . Emphysema of lung [facility])    "one dr says COPD; one dr says emphysema" (09/18/2017)  . Essential hypertension       . FIBROMYALGIA   . GERD (gastroesophageal reflux disease)       . Glaucoma   . HYPERLIPIDEMIA    a. Intolerant to statins.  . INSOMNIA    takes Ambien nightly  . Myocardial infarction [facility])    a.  2012 Myoview notable for prior infarct;  b. 03/2015 Lexiscan CL: EF 37%, diff HK, small area of inferior infarct from apex to base-->Med Rx.  . Myocardial infarction ([place])   . O2 dependent    "2.5L q hs & prn" (09/18/2017)  . Paroxysmal atrial fibrillation (HCC)    a. CHA2DS2VASc = 3--> takes Coumadin;  b. 03/15/2015 Successful TEE/DCCV;  c. 03/2015 recurrent afib, Amio d/c'd in setting of hyperthyroidism.  . Peripheral neuropathy   . Pneumonia 12/2016  . Rash and other nonspecific skin eruption 04/12/2009   no cause found saw dermatologists x 2 and allergist  . SLEEP APNEA, OBSTRUCTIVE    a. doesn't use CPAP  . Syncope    a. 03/2015 s/p MDT LINQ.  [name] Type II diabetes mellitus ([place])        Past Surgical History:  Procedure Laterality Date  . ACNE CYST REMOVAL     2 on back   . AV NODE ABLATION N/A 10/25/2017   Procedure: AV NODE ABLATION;  Surgeon: Deboraha Sprang, MD;  Location: [place] CV LAB;  Service: Cardiovascular;  Laterality: N/A;  . BIV ICD INSERTION CRT-D N/A 09/18/2017   Procedure: BIV ICD INSERTION CRT-D;  Surgeon: Deboraha Sprang, MD;  Location: [place] CV LAB;  Service: Cardiovascular;  Laterality: N/A;  . CARDIAC CATHETERIZATION N/A 03/21/2016   Procedure: Right/Left Heart Cath and Coronary Angiography;  Surgeon: Larey Dresser, MD;  Location: [place] CV LAB;  Service: Cardiovascular;  Laterality: N/A;  . CARDIOVERSION  04/18/2012   Procedure: CARDIOVERSION;  Surgeon: Fay Records, MD;  Location: [place];  Service: Cardiovascular;  Laterality: N/A;  . CARDIOVERSION  04/25/2012   Procedure: CARDIOVERSION;  Surgeon: Thayer Headings, MD;  Location: [place];  Service: Cardiovascular;  Laterality: N/A;  . CARDIOVERSION  04/25/2012   Procedure: CARDIOVERSION;  Surgeon: Fay Records, MD;  Location: [place];  Service: Cardiovascular;  Laterality: N/A;  . CARDIOVERSION  05/09/2012   Procedure: CARDIOVERSION;  Surgeon: Sherren Mocha, MD;  Location: [place];  Service:  Cardiovascular;  Laterality: N/A;  changed from crenshaw to cooper by trish/leone-endo  . CARDIOVERSION N/A 03/15/2015   Procedure: CARDIOVERSION;  Surgeon: Thayer Headings, MD;  Location: [facility] ENDOSCOPY;  Service: Cardiovascular;  Laterality: N/A;  . COLONOSCOPY    . COLONOSCOPY WITH PROPOFOL N/A 10/21/2014   Procedure: COLONOSCOPY WITH PROPOFOL;  Surgeon: Ladene Artist, MD;  Location: [facility] ENDOSCOPY;  Service: Endoscopy;  Laterality: N/A;  . EP IMPLANTABLE DEVICE N/A 04/06/2015   Procedure: Loop Recorder Insertion;  Surgeon: Evans Lance, MD;  Location: [place] CV LAB;  Service: Cardiovascular;  Laterality: N/A;  . ESOPHAGOGASTRODUODENOSCOPY    . JOINT REPLACEMENT    . LOOP RECORDER REMOVAL N/A 09/18/2017   Procedure: LOOP RECORDER REMOVAL;  Surgeon: Deboraha Sprang, MD;  Location: [place] CV LAB;  Service: Cardiovascular;  Laterality: N/A;  . RIGHT/LEFT HEART CATH AND CORONARY ANGIOGRAPHY N/A 01/28/2017   Procedure: Right/Left Heart Cath and Coronary Angiography;  Surgeon: Larey Dresser, MD;  Location: [place] CV LAB;  Service: Cardiovascular;  Laterality: N/A;  . TEE WITHOUT CARDIOVERSION  04/25/2012   Procedure: TRANSESOPHAGEAL ECHOCARDIOGRAM (TEE);  Surgeon: Thayer Headings, MD;  Location: [place];  Service: Cardiovascular;  Laterality: N/A;  . TEE WITHOUT CARDIOVERSION N/A 03/15/2015   Procedure: TRANSESOPHAGEAL ECHOCARDIOGRAM (TEE);  Surgeon: Thayer Headings, MD;  Location: [place];  Service: Cardiovascular;  Laterality: N/A;  . TONSILLECTOMY AND ADENOIDECTOMY    . TOTAL KNEE ARTHROPLASTY Right 06/15/2014   Procedure: TOTAL KNEE ARTHROPLASTY;  Surgeon: Renette Butters, MD;  Location: [place];  Service: Orthopedics;  Laterality: Right;   Social History   Socioeconomic History  . Marital status: Divorced    Spouse name: Not on file  . Number of children: 2  . Years of education: Not on file  . Highest education level: Not on file  Occupational History  . Occupation:  retired/disabled. prev worked in Therapist, sports.    Employer: DISABLED  Social Needs  . Financial resource strain: Not on file  . Food insecurity:    Worry: Not on file    Inability: Not on file  . Transportation needs:    Medical: Not on file    Non-medical: Not on file  Tobacco Use  . Smoking status: Former Smoker    Packs/day: 2.00    Years: 30.00    Pack years: 60.00    Types: Cigarettes    Last attempt to quit: 10/16/2007    Years since quitting: 10.3  . Smokeless tobacco: Never Used  Substance and Sexual Activity  . Alcohol use: No  . Drug use: No  . Sexual activity: Not Currently  Lifestyle  . Physical activity:    Days per  week: Not on file    Minutes per session: Not on file  . Stress: Not on file  Relationships  . Social connections:    Talks on phone: Not on file    Gets together: Not on file    Attends religious service: Not on file    Active member of club or organization: Not on file    Attends meetings of clubs or organizations: Not on file    Relationship status: Not on file  Other Topics Concern  . Not on file  Social History Narrative   Lives alone.   Allergies  Allergen Reactions  . Amiodarone Other (See Comments)    hyperthyroidism  . Statins Other (See Comments)    myalgia  . Tape Other (See Comments)    Skin Tears Use Paper Tape Only   Family History  Problem Relation Age of Onset  . COPD Mother   . Asthma Mother   . Colon polyps Mother   . Allergies Mother   . Hypothyroidism Mother   . Asthma Maternal Grandmother   . Colon cancer Neg Hx      Past medical history, social, surgical and family history all reviewed in electronic medical record.  No pertanent information unless stated regarding to the chief complaint.   Review of Systems:Review of systems updated and as accurate as of 02/13/18  No headache, visual changes, nausea, vomiting, diarrhea, constipation, dizziness, abdominal pain, skin rash, fevers, chills, night sweats,  weight loss, swollen lymph nodes, , chest pain, shortness of breath, mood changes.  Positive muscle aches, joint swelling, body aches  Objective  Blood pressure 110/62, pulse 78, height 5\' 10"  (1.778 m), weight 269 lb (122 kg), SpO2 96 %. Systems examined below as of 02/13/18   General: No apparent distress alert and oriented x3 mood and affect normal, dressed appropriately.  HEENT: Pupils equal, extraocular movements intact  Respiratory: Patient's speak in full sentences and does not appear short of breath  Cardiovascular: No lower extremity edema, non tender, no erythema  Skin: Warm dry intact with no signs of infection or rash on extremities or on axial skeleton.  Abdomen: Soft nontender  Neuro: Cranial nerves II through XII are intact, neurovascularly intact in all extremities with 2+ DTRs and 2+ pulses.  Lymph: No lymphadenopathy of posterior or anterior cervical chain or axillae bilaterally.  Gait antalgic gait MSK:  tender with full range of motion and good stability and symmetric strength and tone of elbows, wrist, hip, and ankles bilaterally.  Knee: Left valgus deformity noted. Large thigh to calf ratio.  Tender to palpation over medial and PF joint line.  ROM full in flexion and extension and lower leg rotation. instability with valgus force.  painful patellar compression. Patellar glide with moderate crepitus. Patellar and quadriceps tendons unremarkable. Hamstring and quadriceps strength is normal. Contralateral knee shows replacement the patient does have some instability of the right knee noted.  Shoulder: left Inspection reveals no abnormalities, atrophy or asymmetry. Palpation is normal with no tenderness over AC joint or bicipital groove. ROM is full in all planes passively. Rotator cuff strength normal throughout. signs of impingement with positive Neer and Hawkin's tests, but negative empty can sign. Speeds and Yergason's tests normal. No labral pathology noted  with negative Obrien's, negative clunk and good stability. Normal scapular function observed. No painful arc and no drop arm sign. No apprehension sign Contralateral shoulder still has impingement in 4 out of 5 strength.  After informed written and verbal consent, patient  was seated on exam table. Left knee was prepped with alcohol swab and utilizing anterolateral approach, patient's left knee space was injected with 4:1  marcaine 0.5%: Kenalog 40mg /dL. Patient tolerated the procedure well without immediate complications.  After informed written and verbal consent, patient was seated on exam table. Left shoulder was prepped with alcohol swab and utilizing posterior approach, patient's right glenohumeral space was injected with 4:1  marcaine 0.5%: Kenalog 40mg /dL. Patient tolerated the procedure well without immediate complications.     Impression and Recommendations:     This case required medical decision making of moderate complexity.      Note: This dictation was prepared with Dragon dictation along with smaller phrase technology. Any transcriptional errors that result from this process are unintentional.

## 2018-02-15 ENCOUNTER — Other Ambulatory Visit: Payer: Self-pay | Admitting: Internal Medicine

## 2018-02-15 DIAGNOSIS — I4819 Other persistent atrial fibrillation: Secondary | ICD-10-CM

## 2018-02-15 DIAGNOSIS — I428 Other cardiomyopathies: Secondary | ICD-10-CM

## 2018-02-15 DIAGNOSIS — I255 Ischemic cardiomyopathy: Secondary | ICD-10-CM

## 2018-02-17 NOTE — Telephone Encounter (Signed)
01/20/2018 60# 

## 2018-02-17 NOTE — Telephone Encounter (Signed)
Done erx 

## 2018-02-21 DIAGNOSIS — J449 Chronic obstructive pulmonary disease, unspecified: Secondary | ICD-10-CM | POA: Diagnosis not present

## 2018-02-22 ENCOUNTER — Other Ambulatory Visit (HOSPITAL_COMMUNITY): Payer: Self-pay | Admitting: Cardiology

## 2018-02-25 ENCOUNTER — Other Ambulatory Visit: Payer: Self-pay | Admitting: Allergy

## 2018-02-25 DIAGNOSIS — J3089 Other allergic rhinitis: Secondary | ICD-10-CM

## 2018-02-25 DIAGNOSIS — Z8709 Personal history of other diseases of the respiratory system: Secondary | ICD-10-CM

## 2018-02-25 MED ORDER — FLUTICASONE PROPIONATE 50 MCG/ACT NA SUSP: 2.0000 | Freq: Every day | NASAL | 3 refills | Status: DC | PRN

## 2018-02-27 ENCOUNTER — Other Ambulatory Visit: Payer: Self-pay | Admitting: Family Medicine

## 2018-02-27 NOTE — Telephone Encounter (Signed)
Refill done.  

## 2018-03-01 ENCOUNTER — Other Ambulatory Visit (HOSPITAL_COMMUNITY): Payer: Self-pay | Admitting: Cardiology

## 2018-03-04 ENCOUNTER — Ambulatory Visit (INDEPENDENT_AMBULATORY_CARE_PROVIDER_SITE_OTHER): Payer: Medicare HMO | Admitting: General Practice

## 2018-03-04 DIAGNOSIS — Z7901 Long term (current) use of anticoagulants: Secondary | ICD-10-CM

## 2018-03-04 LAB — POCT INR: INR: 2.7 (ref 2.0–3.0)

## 2018-03-04 NOTE — Patient Instructions (Signed)
Pre visit review using our clinic review tool, if applicable. No additional management support is needed unless otherwise documented below in the visit note. 

## 2018-03-05 ENCOUNTER — Other Ambulatory Visit: Payer: Self-pay | Admitting: Internal Medicine

## 2018-03-05 DIAGNOSIS — L308 Other specified dermatitis: Secondary | ICD-10-CM

## 2018-03-11 ENCOUNTER — Other Ambulatory Visit: Payer: Self-pay | Admitting: Internal Medicine

## 2018-03-12 DIAGNOSIS — G4733 Obstructive sleep apnea (adult) (pediatric): Secondary | ICD-10-CM | POA: Diagnosis not present

## 2018-03-15 ENCOUNTER — Other Ambulatory Visit: Payer: Self-pay | Admitting: Internal Medicine

## 2018-03-16 ENCOUNTER — Other Ambulatory Visit: Payer: Self-pay | Admitting: Internal Medicine

## 2018-03-17 DIAGNOSIS — L400 Psoriasis vulgaris: Secondary | ICD-10-CM | POA: Diagnosis not present

## 2018-03-18 ENCOUNTER — Other Ambulatory Visit (INDEPENDENT_AMBULATORY_CARE_PROVIDER_SITE_OTHER): Payer: Medicare HMO

## 2018-03-18 ENCOUNTER — Ambulatory Visit: Payer: Medicare HMO | Admitting: Family Medicine

## 2018-03-18 ENCOUNTER — Encounter: Payer: Self-pay | Admitting: Family Medicine

## 2018-03-18 VITALS — BP 116/72 | HR 76 | Ht 70.0 in | Wt 268.0 lb

## 2018-03-18 DIAGNOSIS — M75101 Unspecified rotator cuff tear or rupture of right shoulder, not specified as traumatic: Secondary | ICD-10-CM | POA: Diagnosis not present

## 2018-03-18 DIAGNOSIS — M546 Pain in thoracic spine: Secondary | ICD-10-CM

## 2018-03-18 DIAGNOSIS — M12811 Other specific arthropathies, not elsewhere classified, right shoulder: Secondary | ICD-10-CM

## 2018-03-18 DIAGNOSIS — M2550 Pain in unspecified joint: Secondary | ICD-10-CM | POA: Diagnosis not present

## 2018-03-18 LAB — CBC WITH DIFFERENTIAL/PLATELET
BASOS PCT: 0.4 % (ref 0.0–3.0)
Basophils Absolute: 0 10*3/uL (ref 0.0–0.1)
EOS PCT: 1.9 % (ref 0.0–5.0)
Eosinophils Absolute: 0.1 10*3/uL (ref 0.0–0.7)
HEMATOCRIT: 38 % — AB (ref 39.0–52.0)
HEMOGLOBIN: 12.9 g/dL — AB (ref 13.0–17.0)
LYMPHS PCT: 19.7 % (ref 12.0–46.0)
Lymphs Abs: 1.5 10*3/uL (ref 0.7–4.0)
MCHC: 33.9 g/dL (ref 30.0–36.0)
MCV: 99.9 fl (ref 78.0–100.0)
MONOS PCT: 8.5 % (ref 3.0–12.0)
Monocytes Absolute: 0.6 10*3/uL (ref 0.1–1.0)
Neutro Abs: 5.2 10*3/uL (ref 1.4–7.7)
Neutrophils Relative %: 69.5 % (ref 43.0–77.0)
Platelets: 171 10*3/uL (ref 150.0–400.0)
RBC: 3.8 Mil/uL — ABNORMAL LOW (ref 4.22–5.81)
RDW: 13.8 % (ref 11.5–15.5)
WBC: 7.4 10*3/uL (ref 4.0–10.5)

## 2018-03-18 LAB — COMPREHENSIVE METABOLIC PANEL
ALBUMIN: 4 g/dL (ref 3.5–5.2)
ALT: 25 U/L (ref 0–53)
AST: 29 U/L (ref 0–37)
Alkaline Phosphatase: 53 U/L (ref 39–117)
BUN: 26 mg/dL — AB (ref 6–23)
CHLORIDE: 108 meq/L (ref 96–112)
CO2: 26 mEq/L (ref 19–32)
CREATININE: 0.94 mg/dL (ref 0.40–1.50)
Calcium: 8.9 mg/dL (ref 8.4–10.5)
GFR: 86.08 mL/min (ref >60.00)
Glucose, Bld: 95 mg/dL (ref 70–99)
POTASSIUM: 4.6 meq/L (ref 3.5–5.1)
SODIUM: 140 meq/L (ref 135–145)
Total Bilirubin: 0.4 mg/dL (ref 0.2–1.2)
Total Protein: 6.2 g/dL (ref 6.0–8.3)

## 2018-03-18 LAB — TSH: TSH: 1.44 u[IU]/mL (ref 0.35–4.50)

## 2018-03-18 LAB — IBC PANEL
IRON: 181 ug/dL — AB (ref 42–165)
Saturation Ratios: 50.5 % — ABNORMAL HIGH (ref 20.0–50.0)
Transferrin: 256 mg/dL (ref 212.0–360.0)

## 2018-03-18 LAB — URIC ACID: URIC ACID, SERUM: 6.4 mg/dL (ref 4.0–7.8)

## 2018-03-18 LAB — CHOLESTEROL, TOTAL: Cholesterol: 109 mg/dL (ref 0–200)

## 2018-03-18 LAB — SEDIMENTATION RATE: Sed Rate: 11 mm/hr (ref 0–20)

## 2018-03-18 NOTE — Assessment & Plan Note (Signed)
Did improve after the injection but did have trigger points.  We will continue to monitor.  Patient does not want any surgical intervention.

## 2018-03-18 NOTE — Progress Notes (Signed)
[name] [address] [place], [name] [address] Phone: [phone] Subjective:     CC: Back pain follow-up  [id number]  [name] is a 62 y.o. male coming in with complaint of back pain.  Patient does have a past medical history significant for thoracic congenital fusion T10-T11.  Had responded fairly well to epidural injections in the right scapular region back in September.  Patient does have known rotator cuff arthropathy of the right shoulder and last injection was January 14, 2018.  Patient states that the shoulder seems to be doing relatively well but continues to have more of a pain  Patient continues to have pain all over though.  Has some difficulty.     Past Medical History:  Diagnosis Date  . AICD (automatic cardioverter/defibrillator) present   . Anemia    supposed to be taking Vit B but doesn't  . ANXIETY    takes Xanax nightly  . Arthritis   . Asthma    Albuterol prn and Advair daily;also takes Prednisone daily  . Atrial fibrillation ([place]) 09/06/2015  . Cardiomyopathy [facility])    a. EF 25% TEE July 2013; b. EF normalized 2015;  c. 03/2015 Echo: EF 40-45%, difrf HK, PASP 38 mmHg, Mild MR, sev LAE/RAE.  [name] Chronic constipation    takes OTC stool softener  . COPD (chronic obstructive pulmonary disease) ([place])    "one dr says COPD; one dr says emphysema" (09/18/2017)  . DEPRESSION    takes Zoloft and Doxepin daily  . Diverticulitis   . DYSKINESIA, ESOPHAGUS   . Emphysema of lung [facility])    "one dr says COPD; one dr says emphysema" (09/18/2017)  . Essential hypertension       . FIBROMYALGIA   . GERD (gastroesophageal reflux disease)       . Glaucoma   . HYPERLIPIDEMIA    a. Intolerant to statins.  . INSOMNIA    takes Ambien nightly  . Myocardial infarction [facility])    a. 2012 Myoview notable for prior infarct;  b. 03/2015 Lexiscan CL: EF 37%, diff HK, small area of inferior infarct from apex to base-->Med Rx.  . Myocardial infarction  ([place])   . O2 dependent    "2.5L q hs & prn" (09/18/2017)  . Paroxysmal atrial fibrillation (HCC)    a. CHA2DS2VASc = 3--> takes Coumadin;  b. 03/15/2015 Successful TEE/DCCV;  c. 03/2015 recurrent afib, Amio d/c'd in setting of hyperthyroidism.  . Peripheral neuropathy   . Pneumonia 12/2016  . Rash and other nonspecific skin eruption 04/12/2009   no cause found saw dermatologists x 2 and allergist  . SLEEP APNEA, OBSTRUCTIVE    a. doesn't use CPAP  . Syncope    a. 03/2015 s/p MDT LINQ.  [name] Type II diabetes mellitus ([place])        Past Surgical History:  Procedure Laterality Date  . ACNE CYST REMOVAL     2 on back   . AV NODE ABLATION N/A 10/25/2017   Procedure: AV NODE ABLATION;  Surgeon: Deboraha Sprang, MD;  Location: [place] CV LAB;  Service: Cardiovascular;  Laterality: N/A;  . BIV ICD INSERTION CRT-D N/A 09/18/2017   Procedure: BIV ICD INSERTION CRT-D;  Surgeon: Deboraha Sprang, MD;  Location: [place] CV LAB;  Service: Cardiovascular;  Laterality: N/A;  . CARDIAC CATHETERIZATION N/A 03/21/2016   Procedure: Right/Left Heart Cath and Coronary Angiography;  Surgeon: Larey Dresser, MD;  Location: [place] CV LAB;  Service: Cardiovascular;  Laterality: N/A;  . CARDIOVERSION  04/18/2012   Procedure: CARDIOVERSION;  Surgeon: Fay Records, MD;  Location: [place];  Service: Cardiovascular;  Laterality: N/A;  . CARDIOVERSION  04/25/2012   Procedure: CARDIOVERSION;  Surgeon: Thayer Headings, MD;  Location: [place];  Service: Cardiovascular;  Laterality: N/A;  . CARDIOVERSION  04/25/2012   Procedure: CARDIOVERSION;  Surgeon: Fay Records, MD;  Location: [place];  Service: Cardiovascular;  Laterality: N/A;  . CARDIOVERSION  05/09/2012   Procedure: CARDIOVERSION;  Surgeon: Sherren Mocha, MD;  Location: [place];  Service: Cardiovascular;  Laterality: N/A;  changed from crenshaw to cooper by trish/leone-endo  . CARDIOVERSION N/A 03/15/2015   Procedure: CARDIOVERSION;  Surgeon: Thayer Headings, MD;   Location: [facility] ENDOSCOPY;  Service: Cardiovascular;  Laterality: N/A;  . COLONOSCOPY    . COLONOSCOPY WITH PROPOFOL N/A 10/21/2014   Procedure: COLONOSCOPY WITH PROPOFOL;  Surgeon: Ladene Artist, MD;  Location: [facility] ENDOSCOPY;  Service: Endoscopy;  Laterality: N/A;  . EP IMPLANTABLE DEVICE N/A 04/06/2015   Procedure: Loop Recorder Insertion;  Surgeon: Evans Lance, MD;  Location: [place] CV LAB;  Service: Cardiovascular;  Laterality: N/A;  . ESOPHAGOGASTRODUODENOSCOPY    . JOINT REPLACEMENT    . LOOP RECORDER REMOVAL N/A 09/18/2017   Procedure: LOOP RECORDER REMOVAL;  Surgeon: Deboraha Sprang, MD;  Location: [place] CV LAB;  Service: Cardiovascular;  Laterality: N/A;  . RIGHT/LEFT HEART CATH AND CORONARY ANGIOGRAPHY N/A 01/28/2017   Procedure: Right/Left Heart Cath and Coronary Angiography;  Surgeon: Larey Dresser, MD;  Location: [place] CV LAB;  Service: Cardiovascular;  Laterality: N/A;  . TEE WITHOUT CARDIOVERSION  04/25/2012   Procedure: TRANSESOPHAGEAL ECHOCARDIOGRAM (TEE);  Surgeon: Thayer Headings, MD;  Location: [place];  Service: Cardiovascular;  Laterality: N/A;  . TEE WITHOUT CARDIOVERSION N/A 03/15/2015   Procedure: TRANSESOPHAGEAL ECHOCARDIOGRAM (TEE);  Surgeon: Thayer Headings, MD;  Location: [place];  Service: Cardiovascular;  Laterality: N/A;  . TONSILLECTOMY AND ADENOIDECTOMY    . TOTAL KNEE ARTHROPLASTY Right 06/15/2014   Procedure: TOTAL KNEE ARTHROPLASTY;  Surgeon: Renette Butters, MD;  Location: [place];  Service: Orthopedics;  Laterality: Right;   Social History   Socioeconomic History  . Marital status: Divorced    Spouse name: Not on file  . Number of children: 2  . Years of education: Not on file  . Highest education level: Not on file  Occupational History  . Occupation: retired/disabled. prev worked in Therapist, sports.    Employer: DISABLED  Social Needs  . Financial resource strain: Not on file  . Food insecurity:    Worry: Not on file     Inability: Not on file  . Transportation needs:    Medical: Not on file    Non-medical: Not on file  Tobacco Use  . Smoking status: Former Smoker    Packs/day: 2.00    Years: 30.00    Pack years: 60.00    Types: Cigarettes    Last attempt to quit: 10/16/2007    Years since quitting: 10.4  . Smokeless tobacco: Never Used  Substance and Sexual Activity  . Alcohol use: No  . Drug use: No  . Sexual activity: Not Currently  Lifestyle  . Physical activity:    Days per week: Not on file    Minutes per session: Not on file  . Stress: Not on file  Relationships  . Social connections:    Talks on phone: Not on  file    Gets together: Not on file    Attends religious service: Not on file    Active member of club or organization: Not on file    Attends meetings of clubs or organizations: Not on file    Relationship status: Not on file  Other Topics Concern  . Not on file  Social History Narrative   Lives alone.   Allergies  Allergen Reactions  . Amiodarone Other (See Comments)    hyperthyroidism  . Statins Other (See Comments)    myalgia  . Tape Other (See Comments)    Skin Tears Use Paper Tape Only   Family History  Problem Relation Age of Onset  . COPD Mother   . Asthma Mother   . Colon polyps Mother   . Allergies Mother   . Hypothyroidism Mother   . Asthma Maternal Grandmother   . Colon cancer Neg Hx      Past medical history, social, surgical and family history all reviewed in electronic medical record.  No pertanent information unless stated regarding to the chief complaint.   Review of Systems:Review of systems updated and as accurate as of 03/18/18  No headache, visual changes, nausea, vomiting, diarrhea, constipation, dizziness, abdominal pain, skin rash, fevers, chills, night sweats, weight loss, swollen lymph nodes, body aches, joint swelling, muscle aches, chest pain, shortness of breath, mood changes.   Objective  Blood pressure 116/72, pulse 76, height 5'  10" (1.778 m), weight 268 lb (121.6 kg), SpO2 95 %. Systems examined below as of 03/18/18   General: No apparent distress alert and oriented x3 mood and affect normal, dressed appropriately.  HEENT: Pupils equal, extraocular movements intact  Respiratory: Patient's speak in full sentences and does not appear short of breath  Cardiovascular: No lower extremity edema, non tender, no erythema  Skin: Warm dry intact with no signs of infection or rash on extremities or on axial skeleton.  Abdomen: Soft nontender  Neuro: Cranial nerves II through XII are intact, neurovascularly intact in all extremities with 2+ DTRs and 2+ pulses.  Lymph: No lymphadenopathy of posterior or anterior cervical chain or axillae bilaterally.  Gait antalgic MSK:  tender with full range of motion and good stability and symmetric strength and tone of  elbows, wrist, hip, knee and ankles bilaterally.  Knee: Left valgus deformity noted.  Abnormal thigh to calf ratio.  Tender to palpation over medial and PF joint line.  ROM full in flexion and extension and lower leg rotation. instability with valgus force.  painful patellar compression. Patellar glide with moderate crepitus. Patellar and quadriceps tendons unremarkable. Hamstring and quadriceps strength is normal. Contralateral knee shows replacement but does have some instability noted.   Right shoulder exam does show atrophy noted.  Crepitus noted with range of motion.  No significant number of trigger points noted in the right shoulder region posteriorly.  After verbal consent patient was prepped with alcohol swabs then injected with a 25-gauge half inch needle was injected with a total of 4 cc of 0.5% Marcaine and 1 cc of Kenalog 40 mg/mL 4 distinct trigger points in the right shoulder region.  Minimal blood loss.  Postinjection instructions given.    Impression and Recommendations:     This case required medical decision making of moderate complexity.       Note: This dictation was prepared with Dragon dictation along with smaller phrase technology. Any transcriptional errors that result from this process are unintentional.

## 2018-03-18 NOTE — Assessment & Plan Note (Signed)
Patient with trigger points and did respond fairly well.  Discussed icing regimen.  Patient will do topical anti-inflammatories.  I do believe that this muscle tightness is likely more secondary to the rotator cuff arthropathy.  Worsening symptoms may consider another repeating in the shoulder joint.  Patient's pain seems to be out of proportion and laboratory work-up also ordered.

## 2018-03-18 NOTE — Patient Instructions (Addendum)
Good to see you  Trigger point injections given today  Ice is your friend We will get a brace for the knee Labs downstairs Hopefully this calms everything down See me again in 6 weeks

## 2018-03-19 ENCOUNTER — Other Ambulatory Visit: Payer: Self-pay | Admitting: Internal Medicine

## 2018-03-20 ENCOUNTER — Other Ambulatory Visit (HOSPITAL_COMMUNITY): Payer: Self-pay | Admitting: Cardiology

## 2018-03-20 ENCOUNTER — Ambulatory Visit (INDEPENDENT_AMBULATORY_CARE_PROVIDER_SITE_OTHER): Payer: Medicare HMO | Admitting: *Deleted

## 2018-03-20 DIAGNOSIS — I428 Other cardiomyopathies: Secondary | ICD-10-CM | POA: Diagnosis not present

## 2018-03-20 DIAGNOSIS — I5022 Chronic systolic (congestive) heart failure: Secondary | ICD-10-CM

## 2018-03-20 NOTE — Progress Notes (Signed)
Remote ICD transmission.   

## 2018-03-21 LAB — ANA: ANA: NEGATIVE

## 2018-03-21 LAB — VITAMIN D 1,25 DIHYDROXY
VITAMIN D2 1, 25 (OH): 9 pg/mL
VITAMIN D3 1, 25 (OH): 29 pg/mL
Vitamin D 1, 25 (OH)2 Total: 38 pg/mL (ref 18–72)

## 2018-03-21 LAB — RHEUMATOID FACTOR

## 2018-03-24 DIAGNOSIS — J449 Chronic obstructive pulmonary disease, unspecified: Secondary | ICD-10-CM | POA: Diagnosis not present

## 2018-03-27 ENCOUNTER — Ambulatory Visit (INDEPENDENT_AMBULATORY_CARE_PROVIDER_SITE_OTHER): Payer: Medicare HMO | Admitting: Internal Medicine

## 2018-03-27 ENCOUNTER — Encounter: Payer: Self-pay | Admitting: Internal Medicine

## 2018-03-27 ENCOUNTER — Other Ambulatory Visit (INDEPENDENT_AMBULATORY_CARE_PROVIDER_SITE_OTHER): Payer: Medicare HMO

## 2018-03-27 VITALS — BP 116/78 | HR 79 | Temp 97.9°F | Ht 70.0 in | Wt 266.0 lb

## 2018-03-27 DIAGNOSIS — G4700 Insomnia, unspecified: Secondary | ICD-10-CM | POA: Diagnosis not present

## 2018-03-27 DIAGNOSIS — Z0000 Encounter for general adult medical examination without abnormal findings: Secondary | ICD-10-CM | POA: Diagnosis not present

## 2018-03-27 DIAGNOSIS — E119 Type 2 diabetes mellitus without complications: Secondary | ICD-10-CM

## 2018-03-27 LAB — HEPATIC FUNCTION PANEL
ALK PHOS: 56 U/L (ref 39–117)
ALT: 25 U/L (ref 0–53)
AST: 26 U/L (ref 0–37)
Albumin: 4.2 g/dL (ref 3.5–5.2)
BILIRUBIN DIRECT: 0.1 mg/dL (ref 0.0–0.3)
Total Bilirubin: 0.4 mg/dL (ref 0.2–1.2)
Total Protein: 6.3 g/dL (ref 6.0–8.3)

## 2018-03-27 LAB — URINALYSIS, ROUTINE W REFLEX MICROSCOPIC
Bilirubin Urine: NEGATIVE
HGB URINE DIPSTICK: NEGATIVE
Ketones, ur: NEGATIVE
LEUKOCYTES UA: NEGATIVE
NITRITE: NEGATIVE
Specific Gravity, Urine: 1.02 (ref 1.000–1.030)
TOTAL PROTEIN, URINE-UPE24: NEGATIVE
URINE GLUCOSE: NEGATIVE
Urobilinogen, UA: 0.2 (ref 0.0–1.0)
pH: 6 (ref 5.0–8.0)

## 2018-03-27 LAB — BASIC METABOLIC PANEL
BUN: 16 mg/dL (ref 6–23)
CHLORIDE: 106 meq/L (ref 96–112)
CO2: 27 mEq/L (ref 19–32)
CREATININE: 0.94 mg/dL (ref 0.40–1.50)
Calcium: 9.2 mg/dL (ref 8.4–10.5)
GFR: 86.07 mL/min (ref >60.00)
GLUCOSE: 96 mg/dL (ref 70–99)
Potassium: 5 mEq/L (ref 3.5–5.1)
Sodium: 141 mEq/L (ref 135–145)

## 2018-03-27 LAB — LIPID PANEL
CHOLESTEROL: 112 mg/dL (ref 0–200)
HDL: 44.6 mg/dL (ref >39.00)
LDL CALC: 48 mg/dL (ref 0–99)
NonHDL: 67.89
TRIGLYCERIDES: 97 mg/dL (ref 0.0–149.0)
Total CHOL/HDL Ratio: 3
VLDL: 19.4 mg/dL (ref 0.0–40.0)

## 2018-03-27 LAB — CBC WITH DIFFERENTIAL/PLATELET
BASOS ABS: 0 10*3/uL (ref 0.0–0.1)
Basophils Relative: 0.5 % (ref 0.0–3.0)
EOS ABS: 0.1 10*3/uL (ref 0.0–0.7)
Eosinophils Relative: 1.2 % (ref 0.0–5.0)
HCT: 38.7 % — ABNORMAL LOW (ref 39.0–52.0)
Hemoglobin: 13.2 g/dL (ref 13.0–17.0)
LYMPHS ABS: 1.9 10*3/uL (ref 0.7–4.0)
LYMPHS PCT: 24.5 % (ref 12.0–46.0)
MCHC: 34.1 g/dL (ref 30.0–36.0)
MCV: 100.7 fl — ABNORMAL HIGH (ref 78.0–100.0)
Monocytes Absolute: 0.8 10*3/uL (ref 0.1–1.0)
Monocytes Relative: 9.9 % (ref 3.0–12.0)
NEUTROS ABS: 4.9 10*3/uL (ref 1.4–7.7)
Neutrophils Relative %: 63.9 % (ref 43.0–77.0)
PLATELETS: 169 10*3/uL (ref 150.0–400.0)
RBC: 3.85 Mil/uL — ABNORMAL LOW (ref 4.22–5.81)
RDW: 13.9 % (ref 11.5–15.5)
WBC: 7.7 10*3/uL (ref 4.0–10.5)

## 2018-03-27 LAB — PSA: PSA: 1.81 ng/mL (ref 0.10–4.00)

## 2018-03-27 LAB — TSH: TSH: 2.31 u[IU]/mL (ref 0.35–4.50)

## 2018-03-27 LAB — HEMOGLOBIN A1C: HEMOGLOBIN A1C: 5.9 % (ref 4.6–6.5)

## 2018-03-27 MED ORDER — TRAZODONE HCL 50 MG PO TABS: 50.0000 mg | ORAL_TABLET | Freq: Every evening | ORAL | 1 refills | Status: DC | PRN

## 2018-03-27 NOTE — Progress Notes (Signed)
Subjective:    Patient ID: [name], male    DOB: January 13, 1956, 62 y.o.   MRN: [id number]  HPI   Here for wellness and f/u;  Overall doing ok;  Pt denies Chest pain, worsening SOB, DOE, wheezing, orthopnea, PND, worsening LE edema, palpitations, dizziness or syncope.  Pt denies neurological change such as new headache, facial or extremity weakness.  Pt denies polydipsia, polyuria, or low sugar symptoms. Pt states overall good compliance with treatment and medications, good tolerability, and has been trying to follow appropriate diet.  Pt denies worsening depressive symptoms, suicidal ideation or panic. No fever, night sweats, wt loss, loss of appetite, or other constitutional symptoms.  Pt states good ability with ADL's, has low fall risk, home safety reviewed and adequate, no other significant changes in hearing or vision, and only occasionally active with exercise.  Remains o2 dependent at night 2.5 L, and also prn exertion during the day with a portable concentrator need Past Medical History:  Diagnosis Date  . AICD (automatic cardioverter/defibrillator) present   . Anemia    supposed to be taking Vit B but doesn't  . ANXIETY    takes Xanax nightly  . Arthritis   . Asthma    Albuterol prn and Advair daily;also takes Prednisone daily  . Atrial fibrillation ([place]) 09/06/2015  . Cardiomyopathy [facility])    a. EF 25% TEE July 2013; b. EF normalized 2015;  c. 03/2015 Echo: EF 40-45%, difrf HK, PASP 38 mmHg, Mild MR, sev LAE/RAE.  [name] Chronic constipation    takes OTC stool softener  . COPD (chronic obstructive pulmonary disease) ([place])    "one dr says COPD; one dr says emphysema" (09/18/2017)  . DEPRESSION    takes Zoloft and Doxepin daily  . Diverticulitis   . DYSKINESIA, ESOPHAGUS   . Emphysema of lung [facility])    "one dr says COPD; one dr says emphysema" (09/18/2017)  . Essential hypertension       . FIBROMYALGIA   . GERD (gastroesophageal reflux disease)       . Glaucoma   . HYPERLIPIDEMIA     a. Intolerant to statins.  . INSOMNIA    takes Ambien nightly  . Myocardial infarction [facility])    a. 2012 Myoview notable for prior infarct;  b. 03/2015 Lexiscan CL: EF 37%, diff HK, small area of inferior infarct from apex to base-->Med Rx.  . Myocardial infarction ([place])   . O2 dependent    "2.5L q hs & prn" (09/18/2017)  . Paroxysmal atrial fibrillation ([facility])    a. CHA2DS2VASc = 3--> takes Coumadin;  b. 03/15/2015 Successful TEE/DCCV;  c. 03/2015 recurrent afib, Amio d/c'd in setting of hyperthyroidism.  . Peripheral neuropathy   . Pneumonia 12/2016  . Rash and other nonspecific skin eruption 04/12/2009   no cause found saw dermatologists x 2 and allergist  . SLEEP APNEA, OBSTRUCTIVE    a. doesn't use CPAP  . Syncope    a. 03/2015 s/p MDT LINQ.  [name] Type II diabetes mellitus ([place])        Past Surgical History:  Procedure Laterality Date  . ACNE CYST REMOVAL     2 on back   . AV NODE ABLATION N/A 10/25/2017   Procedure: AV NODE ABLATION;  Surgeon: Deboraha Sprang, MD;  Location: [place] CV LAB;  Service: Cardiovascular;  Laterality: N/A;  . BIV ICD INSERTION CRT-D N/A 09/18/2017   Procedure: BIV ICD INSERTION CRT-D;  Surgeon: Deboraha Sprang, MD;  Location:  [place] INVASIVE CV LAB;  Service: Cardiovascular;  Laterality: N/A;  . CARDIAC CATHETERIZATION N/A 03/21/2016   Procedure: Right/Left Heart Cath and Coronary Angiography;  Surgeon: Larey Dresser, MD;  Location: [place] CV LAB;  Service: Cardiovascular;  Laterality: N/A;  . CARDIOVERSION  04/18/2012   Procedure: CARDIOVERSION;  Surgeon: Fay Records, MD;  Location: [place];  Service: Cardiovascular;  Laterality: N/A;  . CARDIOVERSION  04/25/2012   Procedure: CARDIOVERSION;  Surgeon: Thayer Headings, MD;  Location: [place];  Service: Cardiovascular;  Laterality: N/A;  . CARDIOVERSION  04/25/2012   Procedure: CARDIOVERSION;  Surgeon: Fay Records, MD;  Location: [place];  Service: Cardiovascular;  Laterality: N/A;  . CARDIOVERSION   05/09/2012   Procedure: CARDIOVERSION;  Surgeon: Sherren Mocha, MD;  Location: [place];  Service: Cardiovascular;  Laterality: N/A;  changed from crenshaw to cooper by trish/leone-endo  . CARDIOVERSION N/A 03/15/2015   Procedure: CARDIOVERSION;  Surgeon: Thayer Headings, MD;  Location: [facility] ENDOSCOPY;  Service: Cardiovascular;  Laterality: N/A;  . COLONOSCOPY    . COLONOSCOPY WITH PROPOFOL N/A 10/21/2014   Procedure: COLONOSCOPY WITH PROPOFOL;  Surgeon: Ladene Artist, MD;  Location: [facility] ENDOSCOPY;  Service: Endoscopy;  Laterality: N/A;  . EP IMPLANTABLE DEVICE N/A 04/06/2015   Procedure: Loop Recorder Insertion;  Surgeon: Evans Lance, MD;  Location: [place] CV LAB;  Service: Cardiovascular;  Laterality: N/A;  . ESOPHAGOGASTRODUODENOSCOPY    . JOINT REPLACEMENT    . LOOP RECORDER REMOVAL N/A 09/18/2017   Procedure: LOOP RECORDER REMOVAL;  Surgeon: Deboraha Sprang, MD;  Location: [place] CV LAB;  Service: Cardiovascular;  Laterality: N/A;  . RIGHT/LEFT HEART CATH AND CORONARY ANGIOGRAPHY N/A 01/28/2017   Procedure: Right/Left Heart Cath and Coronary Angiography;  Surgeon: Larey Dresser, MD;  Location: [place] CV LAB;  Service: Cardiovascular;  Laterality: N/A;  . TEE WITHOUT CARDIOVERSION  04/25/2012   Procedure: TRANSESOPHAGEAL ECHOCARDIOGRAM (TEE);  Surgeon: Thayer Headings, MD;  Location: [place];  Service: Cardiovascular;  Laterality: N/A;  . TEE WITHOUT CARDIOVERSION N/A 03/15/2015   Procedure: TRANSESOPHAGEAL ECHOCARDIOGRAM (TEE);  Surgeon: Thayer Headings, MD;  Location: [place];  Service: Cardiovascular;  Laterality: N/A;  . TONSILLECTOMY AND ADENOIDECTOMY    . TOTAL KNEE ARTHROPLASTY Right 06/15/2014   Procedure: TOTAL KNEE ARTHROPLASTY;  Surgeon: Renette Butters, MD;  Location: [place];  Service: Orthopedics;  Laterality: Right;    reports that he quit smoking about 10 years ago. His smoking use included cigarettes. He has a 60.00 pack-year smoking history. He has never  used smokeless tobacco. He reports that he does not drink alcohol or use drugs. family history includes Allergies in his mother; Asthma in his maternal grandmother and mother; COPD in his mother; Colon polyps in his mother; Hypothyroidism in his mother. Allergies  Allergen Reactions  . Amiodarone Other (See Comments)    hyperthyroidism  . Statins Other (See Comments)    myalgia  . Tape Other (See Comments)    Skin Tears Use Paper Tape Only   Current Outpatient Medications on File Prior to Visit  Medication Sig Dispense Refill  . acetaminophen (TYLENOL) 500 MG tablet Take 500 mg by mouth every 6 (six) hours as needed.    [name] albuterol (VENTOLIN HFA) 108 (90 Base) MCG/ACT inhaler INHALE 2 PUFFS 4 TIMES A DAY as needed (Patient taking differently: Inhale 2 puffs into the lungs every 6 (six) hours as needed for wheezing or shortness of breath. INHALE 2 PUFFS  4 TIMES A DAY as needed) 54 Inhaler 3  . ALPRAZolam (XANAX) 0.5 MG tablet TAKE 1mg   BY MOUTH EVERY DAY AT BEDTIME 180 tablet 1  . atorvastatin (LIPITOR) 40 MG tablet TAKE 1 TABLET BY MOUTH EVERY DAY 30 tablet 0  . carisoprodol (SOMA) 350 MG tablet TAKE 1 TABLET BY MOUTH 3 TIMES A DAY AS NEEDED FOR MUSCLE SPASM 90 tablet 5  . carvedilol (COREG) 6.25 MG tablet TAKE 1.5 TABLETS BY MOUTH 2 TIMES DAILY WITH A MEAL. 90 tablet 3  . clobetasol ointment (TEMOVATE) 1.66 % Apply 1 application topically 2 (two) times daily. 60 g 1  . digoxin (LANOXIN) 0.25 MG tablet Take 0.5 tablets (0.125 mg total) by mouth daily. 30 tablet 11  . doxepin (SINEQUAN) 10 MG capsule TAKE 2 CAPSULES BY MOUTH AT BEDTIME. 180 capsule 2  . ezetimibe (ZETIA) 10 MG tablet Take 1 tablet (10 mg total) by mouth daily. 90 tablet 3  . fluticasone (FLONASE) 50 MCG/ACT nasal spray Place 2 sprays into both nostrils daily as needed for allergies or rhinitis. 16 g 3  . Fluticasone-Salmeterol (ADVAIR DISKUS) 250-50 MCG/DOSE AEPB Inhale 1 puff into the lungs 2 (two) times daily. (Patient  taking differently: Inhale 1 puff into the lungs 2 (two) times daily as needed (shortness of breath). ) 180 each 3  . hydrocortisone 2.5 % cream APPLY TO AFFECTED AREA TWICE A DAY 28.35 g 5  . hydrOXYzine (ATARAX/VISTARIL) 10 MG tablet TAKE 1 TABLET BY MOUTH THREE TIMES A DAY AS NEEDED 60 tablet 2  . losartan (COZAAR) 25 MG tablet Take 1 tablet (25 mg total) by mouth daily. 30 tablet 11  . methotrexate (RHEUMATREX) 2.5 MG tablet TAKE 4 TABS EVERY WEEK (Patient taking differently: TAKE 6 TABS EVERY WEEK) 16 tablet 0  . omeprazole (PRILOSEC) 20 MG capsule Take 1 capsule (20 mg total) by mouth daily with supper. 90 capsule 1  . sertraline (ZOLOFT) 100 MG tablet TAKE 2 TABLETS BY MOUTH AT BEDTIME 180 tablet 3  . silver sulfADIAZINE (SILVADENE) 1 % cream APPLY TO AFFECTED AREA TOPICALLY EVERY DAY 50 g 3  . spironolactone (ALDACTONE) 25 MG tablet Take 1 tablet (25 mg total) by mouth daily. 30 tablet 3  . traMADol (ULTRAM) 50 MG tablet TAKE 1 TABLET BY MOUTH EVERY 6 HOURS AS NEEDED 30 tablet 2  . TUMS 500 MG chewable tablet Chew 500-2,000 mg by mouth every 4 (four) hours as needed for indigestion or heartburn.     . valACYclovir (VALTREX) 1000 MG tablet TAKE 1 TABLET BY MOUTH THREE TIMES A DAY 21 tablet 2  . venlafaxine XR (EFFEXOR-XR) 37.5 MG 24 hr capsule TAKE ONE CAPSULE BY MOUTH EVERY DAY WITH BREAKFAST 90 capsule 1  . warfarin (COUMADIN) 5 MG tablet TAKE AS DIRECTED BY ANTICOAGULATION CLINIC 35 tablet 3  . zolpidem (AMBIEN) 10 MG tablet Take 1 tablet (10 mg total) by mouth at bedtime. 90 tablet 1  . torsemide (DEMADEX) 20 MG tablet Take 2 tablets (40 mg total) by mouth 2 (two) times daily. 120 tablet 3   No current facility-administered medications on file prior to visit.    Review of Systems Constitutional: Negative for other unusual diaphoresis, sweats, appetite or weight changes HENT: Negative for other worsening hearing loss, ear pain, facial swelling, mouth sores or neck stiffness.   Eyes:  Negative for other worsening pain, redness or other visual disturbance.  Respiratory: Negative for other stridor or swelling Cardiovascular: Negative for other palpitations or other chest  pain  Gastrointestinal: Negative for worsening diarrhea or loose stools, blood in stool, distention or other pain Genitourinary: Negative for hematuria, flank pain or other change in urine volume.  Musculoskeletal: Negative for myalgias or other joint swelling.  Skin: Negative for other color change, or other wound or worsening drainage.  Neurological: Negative for other syncope or numbness. Hematological: Negative for other adenopathy or swelling Psychiatric/Behavioral: Negative for hallucinations, other worsening agitation, SI, self-injury, or new decreased concentration All other system neg per pt    Objective:   Physical Exam BP 116/78   Pulse 79   Temp 97.9 F (36.6 C) (Oral)   Ht 5\' 10"  (1.778 m)   Wt 266 lb (120.7 kg)   SpO2 95%   BMI 38.17 kg/m  VS noted, obese Constitutional: Pt is oriented to person, place, and time. Appears well-developed and well-nourished, in no significant distress and comfortable Head: Normocephalic and atraumatic  Eyes: Conjunctivae and EOM are normal. Pupils are equal, round, and reactive to light Right Ear: External ear normal without discharge Left Ear: External ear normal without discharge Nose: Nose without discharge or deformity Mouth/Throat: Oropharynx is without other ulcerations and moist  Neck: Normal range of motion. Neck supple. No JVD present. No tracheal deviation present or significant neck LA or mass Cardiovascular: Normal rate, regular rhythm, normal heart sounds and intact distal pulses.   Pulmonary/Chest: WOB normal and breath sounds without rales or wheezing  Abdominal: Soft. Bowel sounds are normal. NT. No HSM  Musculoskeletal: Normal range of motion. Exhibits no edema Lymphadenopathy: Has no other cervical adenopathy.  Neurological: Pt is  alert and oriented to person, place, and time. Pt has normal reflexes. No cranial nerve deficit. Motor grossly intact, Gait intact Skin: Skin is warm and dry. No rash noted or new ulcerations but several purpura to arms Psychiatric:  Has normal mood and affect. Behavior is normal without agitation No other new exam findings Lab Results  Component Value Date   WBC 7.4 03/18/2018   HGB 12.9 (L) 03/18/2018   HCT 38.0 (L) 03/18/2018   PLT 171.0 03/18/2018   GLUCOSE 95 03/18/2018   CHOL 109 03/18/2018   TRIG 214.0 (H) 09/26/2017   HDL 36.60 (L) 09/26/2017   LDLDIRECT 121 (H) 12/19/2017   LDLCALC 96 04/18/2017   ALT 25 03/18/2018   AST 29 03/18/2018   NA 140 03/18/2018   K 4.6 03/18/2018   CL 108 03/18/2018   CREATININE 0.94 03/18/2018   BUN 26 (H) 03/18/2018   CO2 26 03/18/2018   TSH 1.44 03/18/2018   PSA 2.62 09/26/2017   INR 2.7 03/04/2018   HGBA1C 5.5 09/26/2017   MICROALBUR 3.0 (H) 04/18/2017       Assessment & Plan:

## 2018-03-27 NOTE — Assessment & Plan Note (Signed)
Chronic persistent, for trazodone 50 qhs prn

## 2018-03-27 NOTE — Assessment & Plan Note (Signed)
stable overall by history and exam, recent data reviewed with pt, and pt to continue medical treatment as before,  to f/u any worsening symptoms or concerns, for f/u a1c with labs 

## 2018-03-27 NOTE — Assessment & Plan Note (Signed)
Overall doing well, age appropriate education and counseling updated, referrals for preventative services and immunizations addressed, dietary and smoking counseling addressed, most recent labs reviewed.  I have personally reviewed and have noted:  1) the patient's medical and social history 2) The pt's use of alcohol, tobacco, and illicit drugs 3) The patient's current medications and supplements 4) Functional ability including ADL's, fall risk, home safety risk, hearing and visual impairment 5) Diet and physical activities 6) Evidence for depression or mood disorder 7) The patient's height, weight, and BMI have been recorded in the chart  I have made referrals, and provided counseling and education based on review of the above  

## 2018-03-27 NOTE — Patient Instructions (Addendum)
Please remember to call for your yearly eye exam  Please continue all other medications as before, and refills have been done if requested - the trazodone  Please have the pharmacy call with any other refills you may need.  Please continue your efforts at being more active, low cholesterol diet, and weight control.  You are otherwise up to date with prevention measures today.  Please keep your appointments with your specialists as you may have planned  Please go to the LAB in the Basement (turn left off the elevator) for the tests to be done today  You will be contacted by phone if any changes need to be made immediately.  Otherwise, you will receive a letter about your results with an explanation, but please check with MyChart first.  Please remember to sign up for MyChart if you have not done so, as this will be important to you in the future with finding out test results, communicating by private email, and scheduling acute appointments online when needed.  Please return in 6 months, or sooner if needed, with Lab testing done 3-5 days before

## 2018-03-31 ENCOUNTER — Ambulatory Visit (HOSPITAL_BASED_OUTPATIENT_CLINIC_OR_DEPARTMENT_OTHER)
Admission: RE | Admit: 2018-03-31 | Discharge: 2018-03-31 | Disposition: A | Discharge status: Home / Self Care | Payer: Medicare HMO | Source: Ambulatory Visit | Attending: Cardiology | Admitting: Cardiology

## 2018-03-31 ENCOUNTER — Ambulatory Visit (HOSPITAL_COMMUNITY)
Admission: RE | Admit: 2018-03-31 | Discharge: 2018-03-31 | Disposition: A | Discharge status: Home / Self Care | Payer: Medicare HMO | Source: Ambulatory Visit | Attending: Internal Medicine | Admitting: Internal Medicine

## 2018-03-31 ENCOUNTER — Encounter (HOSPITAL_COMMUNITY): Payer: Self-pay | Admitting: Cardiology

## 2018-03-31 ENCOUNTER — Other Ambulatory Visit: Payer: Self-pay | Admitting: Internal Medicine

## 2018-03-31 ENCOUNTER — Other Ambulatory Visit: Payer: Self-pay

## 2018-03-31 VITALS — BP 116/64 | HR 72 | Wt 260.5 lb

## 2018-03-31 DIAGNOSIS — I428 Other cardiomyopathies: Secondary | ICD-10-CM

## 2018-03-31 DIAGNOSIS — E785 Hyperlipidemia, unspecified: Secondary | ICD-10-CM | POA: Diagnosis not present

## 2018-03-31 DIAGNOSIS — Z87891 Personal history of nicotine dependence: Secondary | ICD-10-CM | POA: Insufficient documentation

## 2018-03-31 DIAGNOSIS — Z9981 Dependence on supplemental oxygen: Secondary | ICD-10-CM | POA: Insufficient documentation

## 2018-03-31 DIAGNOSIS — N400 Enlarged prostate without lower urinary tract symptoms: Secondary | ICD-10-CM | POA: Diagnosis not present

## 2018-03-31 DIAGNOSIS — I482 Chronic atrial fibrillation: Secondary | ICD-10-CM | POA: Diagnosis not present

## 2018-03-31 DIAGNOSIS — K219 Gastro-esophageal reflux disease without esophagitis: Secondary | ICD-10-CM | POA: Insufficient documentation

## 2018-03-31 DIAGNOSIS — R21 Rash and other nonspecific skin eruption: Secondary | ICD-10-CM | POA: Diagnosis not present

## 2018-03-31 DIAGNOSIS — I429 Cardiomyopathy, unspecified: Secondary | ICD-10-CM | POA: Diagnosis not present

## 2018-03-31 DIAGNOSIS — Z79891 Long term (current) use of opiate analgesic: Secondary | ICD-10-CM | POA: Insufficient documentation

## 2018-03-31 DIAGNOSIS — Z7951 Long term (current) use of inhaled steroids: Secondary | ICD-10-CM | POA: Diagnosis not present

## 2018-03-31 DIAGNOSIS — Z7901 Long term (current) use of anticoagulants: Secondary | ICD-10-CM | POA: Diagnosis not present

## 2018-03-31 DIAGNOSIS — T8484XA Pain due to internal orthopedic prosthetic devices, implants and grafts, initial encounter: Secondary | ICD-10-CM | POA: Insufficient documentation

## 2018-03-31 DIAGNOSIS — I4891 Unspecified atrial fibrillation: Secondary | ICD-10-CM

## 2018-03-31 DIAGNOSIS — Z825 Family history of asthma and other chronic lower respiratory diseases: Secondary | ICD-10-CM | POA: Insufficient documentation

## 2018-03-31 DIAGNOSIS — J449 Chronic obstructive pulmonary disease, unspecified: Secondary | ICD-10-CM | POA: Insufficient documentation

## 2018-03-31 DIAGNOSIS — Z8371 Family history of colonic polyps: Secondary | ICD-10-CM | POA: Insufficient documentation

## 2018-03-31 DIAGNOSIS — I5022 Chronic systolic (congestive) heart failure: Secondary | ICD-10-CM

## 2018-03-31 DIAGNOSIS — I110 Hypertensive heart disease with heart failure: Secondary | ICD-10-CM | POA: Diagnosis not present

## 2018-03-31 DIAGNOSIS — Z79899 Other long term (current) drug therapy: Secondary | ICD-10-CM | POA: Diagnosis not present

## 2018-03-31 DIAGNOSIS — Z95810 Presence of automatic (implantable) cardiac defibrillator: Secondary | ICD-10-CM | POA: Insufficient documentation

## 2018-03-31 DIAGNOSIS — I255 Ischemic cardiomyopathy: Secondary | ICD-10-CM

## 2018-03-31 DIAGNOSIS — I4819 Other persistent atrial fibrillation: Secondary | ICD-10-CM

## 2018-03-31 DIAGNOSIS — I2510 Atherosclerotic heart disease of native coronary artery without angina pectoris: Secondary | ICD-10-CM | POA: Diagnosis not present

## 2018-03-31 DIAGNOSIS — Z96659 Presence of unspecified artificial knee joint: Secondary | ICD-10-CM

## 2018-03-31 DIAGNOSIS — I442 Atrioventricular block, complete: Secondary | ICD-10-CM | POA: Diagnosis not present

## 2018-03-31 LAB — BASIC METABOLIC PANEL
ANION GAP: 8 (ref 5–15)
BUN: 19 mg/dL (ref 6–20)
CHLORIDE: 102 mmol/L (ref 101–111)
CO2: 28 mmol/L (ref 22–32)
Calcium: 8.9 mg/dL (ref 8.9–10.3)
Creatinine, Ser: 1.01 mg/dL (ref 0.61–1.24)
GFR calc Af Amer: >60 mL/min (ref >60)
GFR calc non Af Amer: >60 mL/min (ref >60)
GLUCOSE: 80 mg/dL (ref 65–99)
POTASSIUM: 4.4 mmol/L (ref 3.5–5.1)
Sodium: 138 mmol/L (ref 135–145)

## 2018-03-31 LAB — DIGOXIN LEVEL: DIGOXIN LVL: 0.3 ng/mL — AB (ref 0.8–2.0)

## 2018-03-31 MED ORDER — CARVEDILOL 12.5 MG PO TABS: 12.5000 mg | ORAL_TABLET | Freq: Two times a day (BID) | ORAL | 3 refills | Status: DC

## 2018-03-31 NOTE — Progress Notes (Signed)
  Echocardiogram 2D Echocardiogram has been performed.  [name] M 03/31/2018, 1:20 PM

## 2018-03-31 NOTE — Telephone Encounter (Signed)
Zolpidem 01/02/2018 90#  Soma 03/01/2018 90#

## 2018-03-31 NOTE — Patient Instructions (Signed)
Stop Potassium  Increase Carvedilol 12.5 mg (1 tab), twice a day  You have been referred to [place] at the [facility] has recommended that you have a cardiopulmonary stress test (CPX). CPX testing is a non-invasive measurement of heart and lung function. It replaces a traditional treadmill stress test. This type of test provides a tremendous amount of information that relates not only to your present condition but also for future outcomes. This test combines measurements of you ventilation, respiratory gas exchange in the lungs, electrocardiogram (EKG), blood pressure and physical response before, during, and following an exercise protocol.  Labs drawn today (if we do not call you, then your lab work was stable)   Your physician recommends that you schedule a follow-up appointment in: 3 months with Dr. [name]

## 2018-03-31 NOTE — Telephone Encounter (Signed)
Done erx 

## 2018-03-31 NOTE — Assessment & Plan Note (Signed)
Patient also has instability of patient's total knee replacement.  Patient has some clicking noted.  I do believe that bracing would be beneficial for this as well but because of patient's abnormal thigh to calf ratio custom bracing would be needed.  Patient will be set up for this as well.

## 2018-04-01 NOTE — Progress Notes (Signed)
PCP: Dr. [name] EP: [name] HF Cardiology: [name]  62 y.o. with history of COPD on home oxygen, permanent atrial fibrillation, and chronic systolic CHF was referred by Dr. [name] for evaluation of CHF.  Amiodarone and DCCV were tried for atrial fibrillation in the past without success.   Patient has been noted to have a low EF since 2013.  EF improved by to normal by 2015 but dropped to 30-35% by 11/16.  Cath in 6/17 showed some CAD but not enough to cause cardiomyopathy.  He was admitted in 3/18 with PNA, atrial fibrillation/RVR, and acute on chronic systolic CHF.  Echo in 3/18 showed fall in EF to 10-15%.   I took him for right and left heart cath in 4/18, which showed nonobstructive CAD and relatively optimized filling pressures.    Syncope in 11/18. Loop recorder showed 6 second pause and periods of complete heart block. He had Medtronic BiV ICD placed in 12/18.  In 1/19, he had AV nodal ablation to promote BiV pacing.   Echo was done today and reviewed, EF 25-30% with moderate RV dilation/mildly decreased systolic function.   Patient is seen today in followup of CHF/dyspnea. Has OSA but cannot tolerate CPAP, uses oxygen at night.  Skin has cleared up with methotrexate, itching much improved. Weight down 8 lbs.  No dyspnea walking on flat ground. Able to climb short flight of steps without problems.  No PND.  He has slept in a recliner chronically.  No chest pain.    Labs (4/18): pro-BNP 904, K 3.1, creatinine 1.39 => 1.08 with BUN 66 => 37, hgb 14.1.  Labs (5/18): hemoglobin 13.1 Labs (6/18): K 4, creatinine 1.3, BNP 294 Labs (7/18): K 3.8, creatinine 1.23, LDL 96, HDL 42 Labs (8/18): K 4.5, creatinine 1.22 Labs (10/18): digoxin 0.4 Labs (12/18): LDL 115, HDL 37, TGs 214, K 4.2, creatinine 0.84 Labs (1/19): K 4.4, creatinine 0.75, pro-BNP 433 Labs (2/19): digoxin 0.4 Labs (3/19): LDL 121, hgb 12.8, K 4.5, creatinine 0.98 Labs (6/19): LDL 48, HDL 45, K 5, creatinine 0.94  PMH: 1.  Asthma 2. COPD: On home oxygen. Prior smoker.  3. Atrial fibrillation: Permanent.  He failed DCCV and amiodarone in the past . 4. HTN 5. GERD 6. Hyperlipidemia 7. BPH.  8. Chronic systolic CHF: Nonischemic cardiomyopathy.  - EF 25% by TEE in 7/13.  Normalized on 2015 echo. - Echo (11/16): EF 30-35%.  - LHC/RHC (6/17): 80% ostial stenosis small D1, 30-40% pLAD.  Mean RA 4, PA 26/10, mean PCWP 11, CI 2.33.  - Echo (3/18): EF 10-15%, diffuse hypokinesis, severe LV dilation, moderate RV dilation with severely decreased systolic function.  - LHC/RHC (4/18): 40% proximal LAD.  Mean RA 8, PA 37/10 mean 22, mean PCWP 22, LVEDP 9, CI 2.39. - Medtronic BiV ICD placement in 12/18 with AV nodal ablation to promote BiV pacing in 1/19.  - Echo (6/19): EF 25-30% with mild LV dilation, moderately dilated RV with mildly decreased systolic function, IVC not dilated, PASP 23 mmHg.  9. Morbid obesity 10. ILR in place.  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 Complete heart block: s/p Medtronic CRT-D.   Social History   Socioeconomic History  . Marital status: Divorced    Spouse name: Not on file  . Number of children: 2  . Years of education: Not on file  . Highest education level: Not on file  Occupational History  . Occupation: retired/disabled. prev worked in Therapist, sports.    Employer: DISABLED  Social Needs  .  Financial resource strain: Not on file  . Food insecurity:    Worry: Not on file    Inability: Not on file  . Transportation needs:    Medical: Not on file    Non-medical: Not on file  Tobacco Use  . Smoking status: Former Smoker    Packs/day: 2.00    Years: 30.00    Pack years: 60.00    Types: Cigarettes    Last attempt to quit: 10/16/2007    Years since quitting: 10.4  . Smokeless tobacco: Never Used  Substance and Sexual Activity  . Alcohol use: No  . Drug use: No  . Sexual activity: Not Currently  Lifestyle  . Physical activity:    Days per week: Not on file     Minutes per session: Not on file  . Stress: Not on file  Relationships  . Social connections:    Talks on phone: Not on file    Gets together: Not on file    Attends religious service: Not on file    Active member of club or organization: Not on file    Attends meetings of clubs or organizations: Not on file    Relationship status: Not on file  . Intimate partner violence:    Fear of current or ex partner: Not on file    Emotionally abused: Not on file    Physically abused: Not on file    Forced sexual activity: Not on file  Other Topics Concern  . Not on file  Social History Narrative   Lives alone.   Family History  Problem Relation Age of Onset  . COPD Mother   . Asthma Mother   . Colon polyps Mother   . Allergies Mother   . Hypothyroidism Mother   . Asthma Maternal Grandmother   . Colon cancer Neg Hx    ROS: All systems reviewed and negative except as per HPI.   Current Outpatient Medications  Medication Sig Dispense Refill  . acetaminophen (TYLENOL) 500 MG tablet Take 500 mg by mouth every 6 (six) hours as needed.    [name] albuterol (VENTOLIN HFA) 108 (90 Base) MCG/ACT inhaler INHALE 2 PUFFS 4 TIMES A DAY as needed (Patient taking differently: Inhale 2 puffs into the lungs every 6 (six) hours as needed for wheezing or shortness of breath. INHALE 2 PUFFS 4 TIMES A DAY as needed) 54 Inhaler 3  . ALPRAZolam (XANAX) 0.5 MG tablet TAKE 1mg   BY MOUTH EVERY DAY AT BEDTIME 180 tablet 1  . atorvastatin (LIPITOR) 40 MG tablet TAKE 1 TABLET BY MOUTH EVERY DAY 30 tablet 0  . carisoprodol (SOMA) 350 MG tablet TAKE 1 TABLET BY MOUTH 3 TIMES A DAY AS NEEDED FOR MUSCLE SPASM 90 tablet 5  . carvedilol (COREG) 12.5 MG tablet Take 1 tablet (12.5 mg total) by mouth 2 (two) times daily with a meal. 60 tablet 3  . clobetasol ointment (TEMOVATE) 8.46 % Apply 1 application topically 2 (two) times daily. 60 g 1  . digoxin (LANOXIN) 0.25 MG tablet Take 0.5 tablets (0.125 mg total) by mouth daily. 30  tablet 11  . doxepin (SINEQUAN) 10 MG capsule TAKE 2 CAPSULES BY MOUTH AT BEDTIME. 180 capsule 2  . ezetimibe (ZETIA) 10 MG tablet Take 1 tablet (10 mg total) by mouth daily. 90 tablet 3  . fluticasone (FLONASE) 50 MCG/ACT nasal spray Place 2 sprays into both nostrils daily as needed for allergies or rhinitis. 16 g 3  . Fluticasone-Salmeterol (ADVAIR DISKUS) 250-50  MCG/DOSE AEPB Inhale 1 puff into the lungs 2 (two) times daily. (Patient taking differently: Inhale 1 puff into the lungs 2 (two) times daily as needed (shortness of breath). ) 180 each 3  . hydrocortisone 2.5 % cream APPLY TO AFFECTED AREA TWICE A DAY 28.35 g 5  . hydrOXYzine (ATARAX/VISTARIL) 10 MG tablet TAKE 1 TABLET BY MOUTH THREE TIMES A DAY AS NEEDED 60 tablet 2  . losartan (COZAAR) 25 MG tablet Take 1 tablet (25 mg total) by mouth daily. 30 tablet 11  . methotrexate (RHEUMATREX) 2.5 MG tablet TAKE 4 TABS EVERY WEEK (Patient taking differently: TAKE 6 TABS EVERY WEEK) 16 tablet 0  . omeprazole (PRILOSEC) 20 MG capsule Take 1 capsule (20 mg total) by mouth daily with supper. 90 capsule 1  . sertraline (ZOLOFT) 100 MG tablet TAKE 2 TABLETS BY MOUTH AT BEDTIME 180 tablet 3  . silver sulfADIAZINE (SILVADENE) 1 % cream APPLY TO AFFECTED AREA TOPICALLY EVERY DAY 50 g 3  . spironolactone (ALDACTONE) 25 MG tablet Take 1 tablet (25 mg total) by mouth daily. 30 tablet 3  . torsemide (DEMADEX) 20 MG tablet Take 2 tablets (40 mg total) by mouth 2 (two) times daily. 120 tablet 3  . traMADol (ULTRAM) 50 MG tablet TAKE 1 TABLET BY MOUTH EVERY 6 HOURS AS NEEDED 30 tablet 2  . traZODone (DESYREL) 50 MG tablet Take 1 tablet (50 mg total) by mouth at bedtime as needed for sleep. 90 tablet 1  . TUMS 500 MG chewable tablet Chew 500-2,000 mg by mouth every 4 (four) hours as needed for indigestion or heartburn.     . valACYclovir (VALTREX) 1000 MG tablet TAKE 1 TABLET BY MOUTH THREE TIMES A DAY 21 tablet 2  . venlafaxine XR (EFFEXOR-XR) 37.5 MG 24 hr  capsule TAKE ONE CAPSULE BY MOUTH EVERY DAY WITH BREAKFAST 90 capsule 1  . warfarin (COUMADIN) 5 MG tablet TAKE AS DIRECTED BY ANTICOAGULATION CLINIC 35 tablet 3  . zolpidem (AMBIEN) 10 MG tablet TAKE 1 TABLET BY MOUTH EVERYDAY AT BEDTIME 90 tablet 1   No current facility-administered medications for this encounter.    BP 116/64   Pulse 72   Wt 260 lb 8 oz (118.2 kg)   SpO2 98%   BMI 37.38 kg/m  General: NAD Neck: No JVD, no thyromegaly or thyroid nodule.  Lungs: Clear to auscultation bilaterally with normal respiratory effort. CV: Nondisplaced PMI.  Heart regular S1/S2, no S3/S4, no murmur.  Trace ankle edema.  No carotid bruit.  Normal pedal pulses.  Abdomen: Soft, nontender, no hepatosplenomegaly, no distention.  Skin: Intact without lesions or rashes.  Neurologic: Alert and oriented x 3.  Psych: Normal affect. Extremities: No clubbing or cyanosis.  HEENT: Normal.   Assessment/Plan:  1. Atrial fibrillation: Permanent.  Now s/p AV nodal ablation to allow effective BiV pacing.  - We discussed switching from warfarin to DOAC, but it appears that DOACs would end up being too expensive for him.  2. COPD: No longer smoking.  He is on home oxygen at night.   3. Chronic systolic CHF: Nonischemic cardiomyopathy based on 6/17 cath. However, he had a recent significant fall in EF from 30-35% to 10-15% on echo from 3/18.  LHC/RHC in 4/18 showed nonobstructive CAD and relatively optimized filling pressures.  He now has Medtronic CRT-D device with AV nodal ablation to allow BiV pacing.  Echo was done today and reviewed, EF 25-30% with mildly decreased RV systolic function. He is not volume overloaded  by exam.  NYHA class II symptoms.   - Increase Coreg to 12.5 mg bid.  - Continue spironolactone 25 mg daily. Check BMET today with borderline elevated K in the past.  - Continue torsemide 40 mg bid.       - Continue losartan 25 mg daily. - Continue digoxin 0.125 daily, check level.  4. CAD:  Nonobstructive on 4/18 cath.  No exertional chest pain.  - He is on warfarin so no ASA.  - Good LDL on atorvastatin and Zetia.   5. Complete heart block: Now has Medtronic CRT-D device and s/p AV nodal ablation.  6. Rash: Spongiotic dermatitis.  Now on steroids and methotrexate, much improved. [name]   He is interested in exercising at the [facility], I encouraged this.    Followup in 3 months.   [name] 04/01/2018

## 2018-04-07 ENCOUNTER — Other Ambulatory Visit: Payer: Self-pay | Admitting: Internal Medicine

## 2018-04-07 ENCOUNTER — Telehealth: Payer: Self-pay | Admitting: Internal Medicine

## 2018-04-07 NOTE — Telephone Encounter (Signed)
Copied from [place] [id number]. Topic: Quick Communication - See Telephone Encounter >> Apr 07, 2018  1:49 PM Burchel, Abbi R wrote: See Telephone encounter for: 04/07/18.   Pt calling to check the status of his knee brace order. Pt Callback #: [phone]

## 2018-04-07 NOTE — Telephone Encounter (Signed)
Done erx 

## 2018-04-12 ENCOUNTER — Other Ambulatory Visit (HOSPITAL_COMMUNITY): Payer: Self-pay | Admitting: Cardiology

## 2018-04-12 DIAGNOSIS — G4733 Obstructive sleep apnea (adult) (pediatric): Secondary | ICD-10-CM | POA: Diagnosis not present

## 2018-04-15 ENCOUNTER — Ambulatory Visit: Payer: Medicare HMO

## 2018-04-16 ENCOUNTER — Other Ambulatory Visit: Payer: Self-pay | Admitting: Internal Medicine

## 2018-04-18 ENCOUNTER — Telehealth: Payer: Self-pay

## 2018-04-18 NOTE — Telephone Encounter (Signed)
Called Tiffany, LVM to find out more information.   Copied from [place] [phone]. Topic: General - Other >> Apr 16, 2018  6:19 PM Valla Leaver wrote: Reason for CRM: Tiffany, peer to peer specialist with [facility], calling to do a peer to peer. Medical necessity is needed no later that July 5th. Call [phone]. [phone] YVD#[phone]

## 2018-04-23 DIAGNOSIS — J449 Chronic obstructive pulmonary disease, unspecified: Secondary | ICD-10-CM | POA: Diagnosis not present

## 2018-04-28 NOTE — Progress Notes (Signed)
[place] Report   Patient Details  Name: [name] MRN: [id number] Date of Birth: 09-Feb-1956 Age: 62 y.o. PCP: [name], MD  Vitals:   04/28/18 1429  BP: 122/70  Pulse: 76  Resp: 18  SpO2: 96%  Weight: 262 lb 9.6 oz (119.1 kg)     [facility] Eval - 04/28/18 1400      Referral    Referring Provider  Dr. [name]    Reason for referral  Diabetes;Family History;Hypertension;Inactivity;Obesitity/Overweight;Orthopedic;Other AFIB   AFIB   Program Start Date  04/29/18      Measurement   Neck measurement  18 Inches    Waist Circumference  52 inches    Body fat  41.2 percent      Information for Trainer   Goals  "To lose 10lbs, stronger body"    Current Exercise  none    Orthopedic Concerns  B knees, B shoulders, RA    Pertinent Medical History  AFIB, RA, DM2, ICD    Restrictions/Precautions  Fall risk    Medications that affect exercise  Beta blocker;Medication causing dizziness/drowsiness;Oxgen oxygen at night   oxygen at night     Timed Up and Go (TUGS)   Timed Up and Go  Moderate risk 10-12 seconds 12.21   12.21     Mobility and Daily Activities   I find it easy to walk up or down two or more flights of stairs.  1    I have no trouble taking out the trash.  4    I do housework such as vacuuming and dusting on my own without difficulty.  4    I can easily lift a gallon of milk (8lbs).  4    I can easily walk a mile.  1    I have no trouble reaching into high cupboards or reaching down to pick up something from the floor.  3    I do not have trouble doing out-door work such as Armed forces logistics/support/administrative officer, raking leaves, or gardening.  2      Mobility and Daily Activities   I feel younger than my age.  2    I feel independent.  3    I feel energetic.  2    I live an active life.   2    I feel strong.  3    I feel healthy.  2    I feel active as other people my age.  3      How fit and strong are you.   Fit and Strong Total Score  36      Past  Medical History:  Diagnosis Date  . AICD (automatic cardioverter/defibrillator) present   . Anemia    supposed to be taking Vit B but doesn't  . ANXIETY    takes Xanax nightly  . Arthritis   . Asthma    Albuterol prn and Advair daily;also takes Prednisone daily  . Atrial fibrillation ([place]) 09/06/2015  . Cardiomyopathy [facility])    a. EF 25% TEE July 2013; b. EF normalized 2015;  c. 03/2015 Echo: EF 40-45%, difrf HK, PASP 38 mmHg, Mild MR, sev LAE/RAE.  [name] Chronic constipation    takes OTC stool softener  . COPD (chronic obstructive pulmonary disease) ([place])    "one dr says COPD; one dr says emphysema" (09/18/2017)  . DEPRESSION    takes Zoloft and Doxepin daily  . Diverticulitis   . DYSKINESIA, ESOPHAGUS   . Emphysema of  lung [facility])    "one dr says COPD; one dr says emphysema" (09/18/2017)  . Essential hypertension       . FIBROMYALGIA   . GERD (gastroesophageal reflux disease)       . Glaucoma   . HYPERLIPIDEMIA    a. Intolerant to statins.  . INSOMNIA    takes Ambien nightly  . Myocardial infarction [facility])    a. 2012 Myoview notable for prior infarct;  b. 03/2015 Lexiscan CL: EF 37%, diff HK, small area of inferior infarct from apex to base-->Med Rx.  . Myocardial infarction ([place])   . O2 dependent    "2.5L q hs & prn" (09/18/2017)  . Paroxysmal atrial fibrillation (H[facility])    a. CHA2DS2VASc = 3--> takes Coumadin;  b. 03/15/2015 Successful TEE/DCCV;  c. 03/2015 recurrent afib, Amio d/c'd in setting of hyperthyroidism.  . Peripheral neuropathy   . Pneumonia 12/2016  . Rash and other nonspecific skin eruption 04/12/2009   no cause found saw dermatologists x 2 and allergist  . SLEEP APNEA, OBSTRUCTIVE    a. doesn't use CPAP  . Syncope    a. 03/2015 s/p MDT LINQ.  [name] Type II diabetes mellitus ([place])        Past Surgical History:  Procedure Laterality Date  . ACNE CYST REMOVAL     2 on back   . AV NODE ABLATION N/A 10/25/2017   Procedure: AV NODE ABLATION;  Surgeon: Deboraha Sprang, MD;   Location: [place] CV LAB;  Service: Cardiovascular;  Laterality: N/A;  . BIV ICD INSERTION CRT-D N/A 09/18/2017   Procedure: BIV ICD INSERTION CRT-D;  Surgeon: Deboraha Sprang, MD;  Location: [place] CV LAB;  Service: Cardiovascular;  Laterality: N/A;  . CARDIAC CATHETERIZATION N/A 03/21/2016   Procedure: Right/Left Heart Cath and Coronary Angiography;  Surgeon: Larey Dresser, MD;  Location: [place] CV LAB;  Service: Cardiovascular;  Laterality: N/A;  . CARDIOVERSION  04/18/2012   Procedure: CARDIOVERSION;  Surgeon: Fay Records, MD;  Location: [place];  Service: Cardiovascular;  Laterality: N/A;  . CARDIOVERSION  04/25/2012   Procedure: CARDIOVERSION;  Surgeon: Thayer Headings, MD;  Location: [place];  Service: Cardiovascular;  Laterality: N/A;  . CARDIOVERSION  04/25/2012   Procedure: CARDIOVERSION;  Surgeon: Fay Records, MD;  Location: [place];  Service: Cardiovascular;  Laterality: N/A;  . CARDIOVERSION  05/09/2012   Procedure: CARDIOVERSION;  Surgeon: Sherren Mocha, MD;  Location: [place];  Service: Cardiovascular;  Laterality: N/A;  changed from crenshaw to cooper by trish/leone-endo  . CARDIOVERSION N/A 03/15/2015   Procedure: CARDIOVERSION;  Surgeon: Thayer Headings, MD;  Location: [facility] ENDOSCOPY;  Service: Cardiovascular;  Laterality: N/A;  . COLONOSCOPY    . COLONOSCOPY WITH PROPOFOL N/A 10/21/2014   Procedure: COLONOSCOPY WITH PROPOFOL;  Surgeon: Ladene Artist, MD;  Location: [facility] ENDOSCOPY;  Service: Endoscopy;  Laterality: N/A;  . EP IMPLANTABLE DEVICE N/A 04/06/2015   Procedure: Loop Recorder Insertion;  Surgeon: Evans Lance, MD;  Location: [place] CV LAB;  Service: Cardiovascular;  Laterality: N/A;  . ESOPHAGOGASTRODUODENOSCOPY    . JOINT REPLACEMENT    . LOOP RECORDER REMOVAL N/A 09/18/2017   Procedure: LOOP RECORDER REMOVAL;  Surgeon: Deboraha Sprang, MD;  Location: [place] CV LAB;  Service: Cardiovascular;  Laterality: N/A;  . RIGHT/LEFT HEART CATH AND CORONARY  ANGIOGRAPHY N/A 01/28/2017   Procedure: Right/Left Heart Cath and Coronary Angiography;  Surgeon: Larey Dresser, MD;  Location: [place] CV LAB;  Service: Cardiovascular;  Laterality: N/A;  . TEE WITHOUT CARDIOVERSION  04/25/2012   Procedure: TRANSESOPHAGEAL ECHOCARDIOGRAM (TEE);  Surgeon: Thayer Headings, MD;  Location: [place];  Service: Cardiovascular;  Laterality: N/A;  . TEE WITHOUT CARDIOVERSION N/A 03/15/2015   Procedure: TRANSESOPHAGEAL ECHOCARDIOGRAM (TEE);  Surgeon: Thayer Headings, MD;  Location: [place];  Service: Cardiovascular;  Laterality: N/A;  . TONSILLECTOMY AND ADENOIDECTOMY    . TOTAL KNEE ARTHROPLASTY Right 06/15/2014   Procedure: TOTAL KNEE ARTHROPLASTY;  Surgeon: Renette Butters, MD;  Location: [place];  Service: Orthopedics;  Laterality: Right;   Social History   Tobacco Use  Smoking Status Former Smoker  . Packs/day: 2.00  . Years: 30.00  . Pack years: 60.00  . Types: Cigarettes  . Last attempt to quit: 10/16/2007  . Years since quitting: 10.5  Smokeless Tobacco Never Used     Mr. Bukhari is set to begin the [facility], AFIB PREP group Tu/Th 11:30-12:30 starting July 16 x 12 weeks.    Vanita Ingles 04/28/2018, 2:34 PM

## 2018-04-30 ENCOUNTER — Other Ambulatory Visit: Payer: Self-pay | Admitting: Internal Medicine

## 2018-05-01 ENCOUNTER — Other Ambulatory Visit (HOSPITAL_COMMUNITY): Payer: Self-pay | Admitting: *Deleted

## 2018-05-01 ENCOUNTER — Ambulatory Visit (HOSPITAL_COMMUNITY): Payer: Medicare HMO | Attending: Cardiology

## 2018-05-01 DIAGNOSIS — I428 Other cardiomyopathies: Secondary | ICD-10-CM

## 2018-05-01 DIAGNOSIS — Z6837 Body mass index (BMI) 37.0-37.9, adult: Secondary | ICD-10-CM | POA: Diagnosis not present

## 2018-05-01 DIAGNOSIS — J449 Chronic obstructive pulmonary disease, unspecified: Secondary | ICD-10-CM | POA: Insufficient documentation

## 2018-05-01 DIAGNOSIS — E669 Obesity, unspecified: Secondary | ICD-10-CM | POA: Insufficient documentation

## 2018-05-02 ENCOUNTER — Ambulatory Visit (INDEPENDENT_AMBULATORY_CARE_PROVIDER_SITE_OTHER): Payer: Medicare HMO | Admitting: General Practice

## 2018-05-02 DIAGNOSIS — Z7901 Long term (current) use of anticoagulants: Secondary | ICD-10-CM | POA: Diagnosis not present

## 2018-05-02 LAB — POCT INR: INR: 2.9 (ref 2.0–3.0)

## 2018-05-02 NOTE — Patient Instructions (Addendum)
Pre visit review using our clinic review tool, if applicable. No additional management support is needed unless otherwise documented below in the visit note.  Continue taking 1 tablet daily except 1 1/2 tablets on Wednesdays.  Re-check in 6 weeks.

## 2018-05-05 ENCOUNTER — Telehealth (HOSPITAL_COMMUNITY): Payer: Self-pay | Admitting: *Deleted

## 2018-05-05 NOTE — Telephone Encounter (Signed)
Result Notes for Cardiopulmonary exercise test   Notes recorded by Darron Doom, RN on 05/05/2018 at 10:10 AM EDT Called and spoke with patient, he's aware and no further questions. ------  Notes recorded by Larey Dresser, MD on 05/04/2018 at 11:51 PM EDT Submaximal but probably only mild limitation from heart failure.

## 2018-05-07 LAB — CUP PACEART REMOTE DEVICE CHECK
Battery Voltage: 3.01 V
Brady Statistic RA Percent Paced: 0 %
Brady Statistic RV Percent Paced: 99.86 %
Date Time Interrogation Session: 20190606073523
HighPow Impedance: 84 Ohm
Implantable Lead Implant Date: 20181205
Implantable Lead Location: 753860
Implantable Lead Model: 4398
Implantable Pulse Generator Implant Date: 20181205
Lead Channel Impedance Value: 1007 Ohm
Lead Channel Impedance Value: 1235 Ohm
Lead Channel Impedance Value: 1235 Ohm
Lead Channel Impedance Value: 1292 Ohm
Lead Channel Impedance Value: 245.538
Lead Channel Impedance Value: 301.859
Lead Channel Impedance Value: 301.859
Lead Channel Impedance Value: 333.387
Lead Channel Impedance Value: 333.387
Lead Channel Impedance Value: 4047 Ohm
Lead Channel Impedance Value: 456 Ohm
Lead Channel Impedance Value: 456 Ohm
Lead Channel Impedance Value: 893 Ohm
Lead Channel Impedance Value: 893 Ohm
Lead Channel Impedance Value: 893 Ohm
Lead Channel Pacing Threshold Amplitude: 0.375 V
Lead Channel Pacing Threshold Pulse Width: 0.4 ms
Lead Channel Sensing Intrinsic Amplitude: 6 mV
Lead Channel Sensing Intrinsic Amplitude: 6 mV
Lead Channel Setting Pacing Amplitude: 2.5 V
MDC IDC LEAD IMPLANT DT: 20181205
MDC IDC LEAD LOCATION: 753858
MDC IDC MSMT BATTERY REMAINING LONGEVITY: 93 mo
MDC IDC MSMT LEADCHNL LV IMPEDANCE VALUE: 1254 Ohm
MDC IDC MSMT LEADCHNL LV IMPEDANCE VALUE: 532 Ohm
MDC IDC MSMT LEADCHNL RV IMPEDANCE VALUE: 361 Ohm
MDC IDC SET LEADCHNL LV PACING AMPLITUDE: 2 V
MDC IDC SET LEADCHNL LV PACING PULSEWIDTH: 0.6 ms
MDC IDC SET LEADCHNL RV PACING PULSEWIDTH: 0.4 ms
MDC IDC SET LEADCHNL RV SENSING SENSITIVITY: 0.3 mV
MDC IDC STAT BRADY AP VP PERCENT: 0 %
MDC IDC STAT BRADY AP VS PERCENT: 0 %
MDC IDC STAT BRADY AS VP PERCENT: 0 %
MDC IDC STAT BRADY AS VS PERCENT: 0 %

## 2018-05-12 DIAGNOSIS — G4733 Obstructive sleep apnea (adult) (pediatric): Secondary | ICD-10-CM | POA: Diagnosis not present

## 2018-05-14 NOTE — Progress Notes (Signed)
[facility] PREP Weekly Session   Patient Details  Name: ABDULKADIR EMMANUEL MRN: [id number] Date of Birth: 06-06-1956 Age: 62 y.o. PCP: [name], MD  Vitals:   05/06/18 1245  Weight: 258 lb (117 kg)    [facility] [employer] Weekly seesion - 05/14/18 1200      Weekly Session   Topic Discussed  Other ways to be active    Minutes exercised this week  20 minutes 71min cardio/10 min strength   71min cardio/10 min strength   Classes attended to date  1      Fun things you did since last meeting:"Run in w/yellow jacket" Things you are grateful for:"my life" Nutrition celebrations:"Lost 3 pounds" Barriers:"soreness"  Vanita Ingles 05/14/2018, 12:46 PM

## 2018-05-16 ENCOUNTER — Other Ambulatory Visit: Payer: Self-pay | Admitting: Internal Medicine

## 2018-05-21 NOTE — Progress Notes (Signed)
[facility] PREP Weekly Session   Patient Details  Name: [name] MRN: [id number] Date of Birth: Nov 04, 1955 Age: 62 y.o. PCP: [name], MD  Vitals:   05/20/18 1505  Weight: 257 lb (116.6 kg)    [facility] Weekly seesion - 05/21/18 1500      Weekly Session   Topic Discussed  Health habits    Minutes exercised this week  120 minutes 60cardio/60strength   60cardio/60strength     Fun things you did since last meeting:"played drums" Things you are grateful for:"everyday" Barriers:"getting old"  Vanita Ingles 05/21/2018, 3:06 PM

## 2018-05-24 DIAGNOSIS — J449 Chronic obstructive pulmonary disease, unspecified: Secondary | ICD-10-CM | POA: Diagnosis not present

## 2018-05-28 NOTE — Progress Notes (Signed)
[facility] PREP Weekly Session   Patient Details  Name: [name] MRN: [id number] Date of Birth: [birth year] Age: 62 y.o. PCP: [name], MD  Vitals:   05/27/18 1314  Weight: 257 lb (116.6 kg)    [facility] Weekly seesion - 05/28/18 1300      Weekly Session   Topic Discussed  Restaurant Eating    Minutes exercised this week  120 minutes   120cardio/strength-everyday/flexiblity-needs help     Things you are grateful for:"everyday" Nutrition celebrations:"less caviar" Barriers:"too fat"  Vanita Ingles 05/28/2018, 1:15 PM

## 2018-06-09 ENCOUNTER — Other Ambulatory Visit: Payer: Self-pay | Admitting: General Practice

## 2018-06-09 ENCOUNTER — Other Ambulatory Visit: Payer: Self-pay | Admitting: Internal Medicine

## 2018-06-09 MED ORDER — WARFARIN SODIUM 5 MG PO TABS: ORAL_TABLET | ORAL | 3 refills | Status: DC

## 2018-06-12 DIAGNOSIS — G4733 Obstructive sleep apnea (adult) (pediatric): Secondary | ICD-10-CM | POA: Diagnosis not present

## 2018-06-13 ENCOUNTER — Ambulatory Visit: Payer: Medicare HMO

## 2018-06-13 NOTE — Progress Notes (Signed)
[facility] PREP Weekly Session   Patient Details  Name: [name] MRN: [id number] Date of Birth: [birth year] Age: 62 y.o. PCP: [name], MD  Vitals:   06/10/18 1600  Weight: 260 lb (117.9 kg)     Barriers:"joints bad"  Vanita Ingles 06/13/2018, 4:01 PM

## 2018-06-18 NOTE — Progress Notes (Signed)
[facility] PREP Weekly Session   Patient Details  Name: [name] MRN: [id number] Date of Birth: [birth year] Age: 62 y.o. PCP: [name], MD  Vitals:   06/17/18 1446  Weight: 258 lb (117 kg)    [facility] Weekly seesion - 06/18/18 1400      Weekly Session   Minutes exercised this week  --   "2cardio/2strength"     Things you are grateful for:"everyday" Barriers:"left side"  Vanita Ingles 06/18/2018, 2:46 PM

## 2018-06-19 ENCOUNTER — Ambulatory Visit (INDEPENDENT_AMBULATORY_CARE_PROVIDER_SITE_OTHER): Payer: Medicare HMO | Admitting: *Deleted

## 2018-06-19 DIAGNOSIS — I5022 Chronic systolic (congestive) heart failure: Secondary | ICD-10-CM

## 2018-06-19 DIAGNOSIS — I428 Other cardiomyopathies: Secondary | ICD-10-CM | POA: Diagnosis not present

## 2018-06-19 NOTE — Progress Notes (Signed)
Remote ICD transmission.   

## 2018-06-20 ENCOUNTER — Ambulatory Visit (INDEPENDENT_AMBULATORY_CARE_PROVIDER_SITE_OTHER): Payer: Medicare HMO | Admitting: General Practice

## 2018-06-20 DIAGNOSIS — Z7901 Long term (current) use of anticoagulants: Secondary | ICD-10-CM

## 2018-06-20 LAB — POCT INR: INR: 3.7 — AB (ref 2.0–3.0)

## 2018-06-20 NOTE — Patient Instructions (Addendum)
Pre visit review using our clinic review tool, if applicable. No additional management support is needed unless otherwise documented below in the visit note.  Skip coumadin today and then take 1 tablet daily.  Re-check in 4 weeks.

## 2018-06-23 ENCOUNTER — Telehealth (HOSPITAL_COMMUNITY): Payer: Self-pay

## 2018-06-23 NOTE — Telephone Encounter (Signed)
Would let him know that sometimes if UOP is less on diuretic, he may not have that much excess fluid.  If his weight is not up and his breathing is stable, would get a BMET and have him go back to the dose of torsemide that had been ordered.

## 2018-06-23 NOTE — Telephone Encounter (Signed)
Pt called and stated that he has not had much urine input. Pt states that he uped his torsemide to 4 tabs twice a day and he only urinates 4x a day even on the high dose of torsemide. Pt declines weight gain. Please advise.

## 2018-06-24 DIAGNOSIS — J449 Chronic obstructive pulmonary disease, unspecified: Secondary | ICD-10-CM | POA: Diagnosis not present

## 2018-06-24 NOTE — Telephone Encounter (Signed)
Left VM for pt to call the office.

## 2018-06-24 NOTE — Telephone Encounter (Signed)
Opened in error

## 2018-06-25 ENCOUNTER — Telehealth: Payer: Self-pay | Admitting: Family Medicine

## 2018-06-25 NOTE — Telephone Encounter (Signed)
Patient called asking to be seen by Dr Tamala Julian for knee and shoulder pain. The first available appointment is October 1st and he can not wait that long. Anywhere we can get him in sooner?

## 2018-06-25 NOTE — Telephone Encounter (Signed)
Spoke to pt, scheduled him for 9.17.19.

## 2018-06-25 NOTE — Progress Notes (Signed)
[facility] PREP Weekly Session   Patient Details  Name: [name] MRN: [id number] Date of Birth: 10/21/1955 Age: 62 y.o. PCP: [name], MD  There were no vitals filed for this visit.  [facility] Weekly seesion - 06/25/18 1400      Weekly Session   Topic Discussed  Other   portion control   Minutes exercised this week  360 minutes   120cardio/240strength     Fun things you did since last meeting:"homecoming church" Things you are grateful for:"everyday" Barriers:"knee & shoulder"  Vanita Ingles 06/25/2018, 2:45 PM

## 2018-06-26 NOTE — Telephone Encounter (Signed)
Pt is not short of breath and his weight is stable. Pt aware and agreeable with plan. Lab appt scheduled.

## 2018-06-27 ENCOUNTER — Ambulatory Visit (HOSPITAL_COMMUNITY)
Admission: RE | Admit: 2018-06-27 | Discharge: 2018-06-27 | Disposition: A | Discharge status: Home / Self Care | Payer: Medicare HMO | Source: Ambulatory Visit | Attending: Internal Medicine | Admitting: Internal Medicine

## 2018-06-27 DIAGNOSIS — I428 Other cardiomyopathies: Secondary | ICD-10-CM

## 2018-06-27 LAB — BASIC METABOLIC PANEL
Anion gap: 10 (ref 5–15)
BUN: 14 mg/dL (ref 8–23)
CHLORIDE: 105 mmol/L (ref 98–111)
CO2: 23 mmol/L (ref 22–32)
Calcium: 9 mg/dL (ref 8.9–10.3)
Creatinine, Ser: 1.09 mg/dL (ref 0.61–1.24)
GFR calc Af Amer: >60 mL/min (ref >60)
Glucose, Bld: 123 mg/dL — ABNORMAL HIGH (ref 70–99)
Potassium: 3.7 mmol/L (ref 3.5–5.1)
SODIUM: 138 mmol/L (ref 135–145)

## 2018-06-30 DIAGNOSIS — Z79899 Other long term (current) drug therapy: Secondary | ICD-10-CM | POA: Diagnosis not present

## 2018-06-30 DIAGNOSIS — L309 Dermatitis, unspecified: Secondary | ICD-10-CM | POA: Diagnosis not present

## 2018-06-30 NOTE — Progress Notes (Signed)
[name] [place], [place] [address] Phone: [phone] Subjective:    I [name] am serving as a Education administrator for Dr. Hulan Saas.    CC: Right knee and shoulder pain both left-sided  BJY:[id number]  [name] is a 62 y.o. male coming in with complaint of left knee and shoulder pain. Right foot pain. Believes he has a bunion. Left knee is painful.   Left knee severe.  Been seen a year ago with some degenerative joint disease.  Given an injection back in May 2018.  Was doing well until the last several months.  Started having increasing discomfort and pain.  Some increasing instability.  Has had a replacement on the right side.  Notices intermittent swelling.  Patient also having left shoulder pain.  Left-handed.  Discussed home exercise, icing regimen, which activities of doing which wants to avoid.  Patient is to increase activity as tolerated.  Patient has not been able to do this.  Noticing some decreasing range of motion.  Pain with certain movements.  Can be uncomfortable at night.  Not as weak as the contralateral side.    Past Medical History:  Diagnosis Date  . AICD (automatic cardioverter/defibrillator) present   . Anemia    supposed to be taking Vit B but doesn't  . ANXIETY    takes Xanax nightly  . Arthritis   . Asthma    Albuterol prn and Advair daily;also takes Prednisone daily  . Atrial fibrillation ([place]) 09/06/2015  . Cardiomyopathy [facility])    a. EF 25% TEE July 2013; b. EF normalized 2015;  c. 03/2015 Echo: EF 40-45%, difrf HK, PASP 38 mmHg, Mild MR, sev LAE/RAE.  [name] Chronic constipation    takes OTC stool softener  . COPD (chronic obstructive pulmonary disease) ([place])    "one dr says COPD; one dr says emphysema" (09/18/2017)  . DEPRESSION    takes Zoloft and Doxepin daily  . Diverticulitis   . DYSKINESIA, ESOPHAGUS   . Emphysema of lung [facility])    "one dr says COPD; one dr says emphysema" (09/18/2017)  . Essential  hypertension       . FIBROMYALGIA   . GERD (gastroesophageal reflux disease)       . Glaucoma   . HYPERLIPIDEMIA    a. Intolerant to statins.  . INSOMNIA    takes Ambien nightly  . Myocardial infarction [facility])    a. 2012 Myoview notable for prior infarct;  b. 03/2015 Lexiscan CL: EF 37%, diff HK, small area of inferior infarct from apex to base-->Med Rx.  . Myocardial infarction ([place])   . O2 dependent    "2.5L q hs & prn" (09/18/2017)  . Paroxysmal atrial fibrillation ([facility])    a. CHA2DS2VASc = 3--> takes Coumadin;  b. 03/15/2015 Successful TEE/DCCV;  c. 03/2015 recurrent afib, Amio d/c'd in setting of hyperthyroidism.  . Peripheral neuropathy   . Pneumonia 12/2016  . Rash and other nonspecific skin eruption 04/12/2009   no cause found saw dermatologists x 2 and allergist  . SLEEP APNEA, OBSTRUCTIVE    a. doesn't use CPAP  . Syncope    a. 03/2015 s/p MDT LINQ.  [name] Type II diabetes mellitus ([place])        Past Surgical History:  Procedure Laterality Date  . ACNE CYST REMOVAL     2 on back   . AV NODE ABLATION N/A 10/25/2017   Procedure: AV NODE ABLATION;  Surgeon: Deboraha Sprang, MD;  Location: [place] [facility];  Service: Cardiovascular;  Laterality: N/A;  . BIV ICD INSERTION CRT-D N/A 09/18/2017   Procedure: BIV ICD INSERTION CRT-D;  Surgeon: Deboraha Sprang, MD;  Location: [place] [facility];  Service: Cardiovascular;  Laterality: N/A;  . CARDIAC CATHETERIZATION N/A 03/21/2016   Procedure: Right/Left Heart Cath and Coronary Angiography;  Surgeon: Larey Dresser, MD;  Location: [place] [facility];  Service: Cardiovascular;  Laterality: N/A;  . CARDIOVERSION  04/18/2012   Procedure: CARDIOVERSION;  Surgeon: Fay Records, MD;  Location: [place];  Service: Cardiovascular;  Laterality: N/A;  . CARDIOVERSION  04/25/2012   Procedure: CARDIOVERSION;  Surgeon: Thayer Headings, MD;  Location: [place];  Service: Cardiovascular;  Laterality: N/A;  . CARDIOVERSION  04/25/2012   Procedure:  CARDIOVERSION;  Surgeon: Fay Records, MD;  Location: [place];  Service: Cardiovascular;  Laterality: N/A;  . CARDIOVERSION  05/09/2012   Procedure: CARDIOVERSION;  Surgeon: Sherren Mocha, MD;  Location: [place];  Service: Cardiovascular;  Laterality: N/A;  changed from crenshaw to cooper by trish/leone-endo  . CARDIOVERSION N/A 03/15/2015   Procedure: CARDIOVERSION;  Surgeon: Thayer Headings, MD;  Location: [facility] ENDOSCOPY;  Service: Cardiovascular;  Laterality: N/A;  . COLONOSCOPY    . COLONOSCOPY WITH PROPOFOL N/A 10/21/2014   Procedure: COLONOSCOPY WITH PROPOFOL;  Surgeon: Ladene Artist, MD;  Location: [facility] ENDOSCOPY;  Service: Endoscopy;  Laterality: N/A;  . EP IMPLANTABLE DEVICE N/A 04/06/2015   Procedure: Loop Recorder Insertion;  Surgeon: Evans Lance, MD;  Location: [place] [facility];  Service: Cardiovascular;  Laterality: N/A;  . ESOPHAGOGASTRODUODENOSCOPY    . JOINT REPLACEMENT    . LOOP RECORDER REMOVAL N/A 09/18/2017   Procedure: LOOP RECORDER REMOVAL;  Surgeon: Deboraha Sprang, MD;  Location: [place] [facility];  Service: Cardiovascular;  Laterality: N/A;  . RIGHT/LEFT HEART CATH AND CORONARY ANGIOGRAPHY N/A 01/28/2017   Procedure: Right/Left Heart Cath and Coronary Angiography;  Surgeon: Larey Dresser, MD;  Location: [place] [facility];  Service: Cardiovascular;  Laterality: N/A;  . TEE WITHOUT CARDIOVERSION  04/25/2012   Procedure: TRANSESOPHAGEAL ECHOCARDIOGRAM (TEE);  Surgeon: Thayer Headings, MD;  Location: [place];  Service: Cardiovascular;  Laterality: N/A;  . TEE WITHOUT CARDIOVERSION N/A 03/15/2015   Procedure: TRANSESOPHAGEAL ECHOCARDIOGRAM (TEE);  Surgeon: Thayer Headings, MD;  Location: [place];  Service: Cardiovascular;  Laterality: N/A;  . TONSILLECTOMY AND ADENOIDECTOMY    . TOTAL KNEE ARTHROPLASTY Right 06/15/2014   Procedure: TOTAL KNEE ARTHROPLASTY;  Surgeon: Renette Butters, MD;  Location: [place];  Service: Orthopedics;  Laterality: Right;   Social History    Socioeconomic History  . Marital status: Divorced    Spouse name: Not on file  . Number of children: 2  . Years of education: Not on file  . Highest education level: Not on file  Occupational History  . Occupation: retired/disabled. prev worked in Therapist, sports.    Employer: DISABLED  Social Needs  . Financial resource strain: Not on file  . Food insecurity:    Worry: Not on file    Inability: Not on file  . Transportation needs:    Medical: Not on file    Non-medical: Not on file  Tobacco Use  . Smoking status: Former Smoker    Packs/day: 2.00    Years: 30.00    Pack years: 60.00    Types: Cigarettes    Last attempt to quit: 10/16/2007    Years since quitting:  10.7  . Smokeless tobacco: Never Used  Substance and Sexual Activity  . Alcohol use: No  . Drug use: No  . Sexual activity: Not Currently  Lifestyle  . Physical activity:    Days per week: Not on file    Minutes per session: Not on file  . Stress: Not on file  Relationships  . Social connections:    Talks on phone: Not on file    Gets together: Not on file    Attends religious service: Not on file    Active member of club or organization: Not on file    Attends meetings of clubs or organizations: Not on file    Relationship status: Not on file  Other Topics Concern  . Not on file  Social History Narrative   Lives alone.   Allergies  Allergen Reactions  . Amiodarone Other (See Comments)    hyperthyroidism  . Statins Other (See Comments)    myalgia  . Tape Other (See Comments)    Skin Tears Use Paper Tape Only   Family History  Problem Relation Age of Onset  . COPD Mother   . Asthma Mother   . Colon polyps Mother   . Allergies Mother   . Hypothyroidism Mother   . Asthma Maternal Grandmother   . Colon cancer Neg Hx      Current Outpatient Medications (Cardiovascular):  .  atorvastatin (LIPITOR) 40 MG tablet, TAKE 1 TABLET BY MOUTH EVERY DAY .  atorvastatin (LIPITOR) 40 MG tablet, TAKE 1  TABLET BY MOUTH EVERY DAY .  carvedilol (COREG) 12.5 MG tablet, Take 1 tablet (12.5 mg total) by mouth 2 (two) times daily with a meal. .  digoxin (LANOXIN) 0.25 MG tablet, Take 0.5 tablets (0.125 mg total) by mouth daily. [name]  ezetimibe (ZETIA) 10 MG tablet, Take 1 tablet (10 mg total) by mouth daily. [name]  losartan (COZAAR) 25 MG tablet, Take 1 tablet (25 mg total) by mouth daily. [name]  spironolactone (ALDACTONE) 25 MG tablet, Take 1 tablet (25 mg total) by mouth daily. [name]  torsemide (DEMADEX) 20 MG tablet, Take 2 tablets (40 mg total) by mouth 2 (two) times daily.  Current Outpatient Medications (Respiratory):  .  albuterol (VENTOLIN HFA) 108 (90 Base) MCG/ACT inhaler, INHALE 2 PUFFS 4 TIMES A DAY as needed (Patient taking differently: Inhale 2 puffs into the lungs every 6 (six) hours as needed for wheezing or shortness of breath. INHALE 2 PUFFS 4 TIMES A DAY as needed) .  fluticasone (FLONASE) 50 MCG/ACT nasal spray, Place 2 sprays into both nostrils daily as needed for allergies or rhinitis. .  Fluticasone-Salmeterol (ADVAIR DISKUS) 250-50 MCG/DOSE AEPB, Inhale 1 puff into the lungs 2 (two) times daily. (Patient taking differently: Inhale 1 puff into the lungs 2 (two) times daily as needed (shortness of breath). )  Current Outpatient Medications (Analgesics):  .  acetaminophen (TYLENOL) 500 MG tablet, Take 500 mg by mouth every 6 (six) hours as needed. .  traMADol (ULTRAM) 50 MG tablet, TAKE 1 TABLET BY MOUTH EVERY 6 HOURS AS NEEDED  Current Outpatient Medications (Hematological):  .  warfarin (COUMADIN) 5 MG tablet, Take as directed by anticoagulation clinic.  Current Outpatient Medications (Other):  [name]  ALPRAZolam (XANAX) 0.5 MG tablet, TAKE 1mg   BY MOUTH EVERY DAY AT BEDTIME .  carisoprodol (SOMA) 350 MG tablet, TAKE 1 TABLET BY MOUTH 3 TIMES A DAY AS NEEDED FOR MUSCLE SPASM .  clobetasol ointment (TEMOVATE) 9.37 %, Apply 1 application topically 2 (  two) times daily. [name]  doxepin (SINEQUAN) 10 MG  capsule, TAKE 2 CAPSULES BY MOUTH AT BEDTIME. .  hydrocortisone 2.5 % cream, APPLY TO AFFECTED AREA TWICE A DAY .  hydrOXYzine (ATARAX/VISTARIL) 10 MG tablet, TAKE 1 TABLET BY MOUTH THREE TIMES A DAY AS NEEDED .  lidocaine (LIDODERM) 5 %, APPLY 1 PATCH TO AFFECTED AREA FOR UP TO 12 HOURS . DO NOT USE MORE THAN ONE PATCH IN 24 HOURS .  methotrexate (RHEUMATREX) 2.5 MG tablet, TAKE 4 TABS EVERY WEEK (Patient taking differently: TAKE 6 TABS EVERY WEEK) .  omeprazole (PRILOSEC) 20 MG capsule, Take 1 capsule (20 mg total) by mouth daily with supper. .  sertraline (ZOLOFT) 100 MG tablet, TAKE 2 TABLETS BY MOUTH AT BEDTIME .  silver sulfADIAZINE (SILVADENE) 1 % cream, APPLY TO AFFECTED AREA TOPICALLY EVERY DAY .  traZODone (DESYREL) 100 MG tablet, TAKE 1/2 TABLET BY MOUTH EVERY DAY AT BEDTIME AS NEEDED .  traZODone (DESYREL) 50 MG tablet, Take 1 tablet (50 mg total) by mouth at bedtime as needed for sleep. .  TUMS 500 MG chewable tablet, Chew 500-2,000 mg by mouth every 4 (four) hours as needed for indigestion or heartburn.  .  valACYclovir (VALTREX) 1000 MG tablet, TAKE 1 TABLET BY MOUTH THREE TIMES A DAY .  venlafaxine XR (EFFEXOR-XR) 37.5 MG 24 hr capsule, TAKE ONE CAPSULE BY MOUTH EVERY DAY WITH BREAKFAST .  zolpidem (AMBIEN) 10 MG tablet, TAKE 1 TABLET BY MOUTH EVERYDAY AT BEDTIME    Past medical history, social, surgical and family history all reviewed in electronic medical record.  No pertanent information unless stated regarding to the chief complaint.   Review of Systems:  No headache, visual changes, nausea, vomiting, diarrhea, constipation, dizziness, abdominal pain, skin rash, fevers, chills, night sweats, weight loss, swollen lymph nodes,  chest pain, shortness of breath, mood changes.  Positive muscle aches, body aches, joint swelling  Objective  Blood pressure 104/60, pulse 72, height 5\' 10"  (1.778 m), weight 259 lb (117.5 kg), SpO2 96 %.   General: No apparent distress alert and  oriented x3 mood and affect normal, dressed appropriately.  HEENT: Pupils equal, extraocular movements intact  Respiratory: Patient's speak in full sentences and does not appear short of breath  Cardiovascular: No lower extremity edema, non tender, no erythema  Skin: Warm dry intact with no signs of infection or rash on extremities or on axial skeleton.  Abdomen: Soft nontender  Neuro: Cranial nerves II through XII are intact, neurovascularly intact in all extremities with 2+ DTRs and 2+ pulses.  Lymph: No lymphadenopathy of posterior or anterior cervical chain or axillae bilaterally.  Gait antalgic MSK:  Non tender with full range of motion and good stability and symmetric strength and tone of  elbows, wrist, hip, knee and ankles bilaterally.  Mild arthritic changes of multiple joints  Shoulder: left Inspection reveals no abnormalities, atrophy or asymmetry. Palpation is normal with no tenderness over AC joint or bicipital groove. ROM is full in all planes passively. Rotator cuff strength normal throughout. signs of impingement with positive Neer and Hawkin's tests, but negative empty can sign. Speeds and Yergason's tests normal. No labral pathology noted with negative Obrien's, negative clunk and good stability. Normal scapular function observed. No painful arc and no drop arm sign. No apprehension sign Contralateral shoulder shows 4 out of 5 strength of the rotator cuff and positive impingement.  Knee: Left knee valgus deformity noted.  Abnormal thigh to calf ratio.  Tender to palpation  over medial and PF joint line.  ROM full in flexion and extension and lower leg rotation. instability with valgus force.  painful patellar compression. Patellar glide with moderate crepitus. Patellar and quadriceps tendons unremarkable. Hamstring and quadriceps strength is normal. Contralateral knee shows replacement with good range of motion and good stability  MSK US performed of: left This  study was ordered, performed, and interpreted by Charlann Boxer D.O.  Shoulder:   Supraspinatus:  Appears normal on long and transverse views, Bursal bulge seen with shoulder abduction on impingement view. Infraspinatus:  Appears normal on long and transverse views. Significant increase in Doppler flow Subscapularis:  Appears normal on long and transverse views. Positive bursa Teres Minor:  Appears normal on long and transverse views. AC joint:  Capsule undistended, no geyser sign. Glenohumeral Joint:  Appears normal without effusion. Glenoid Labrum:  Intact without visualized tears. Biceps Tendon:  Appears normal on long and transverse views, no fraying of tendon, tendon located in intertubercular groove, no subluxation with shoulder internal or external rotation.  Impression: Subacromial bursitis  Procedure: Real-time Ultrasound Guided Injection of left glenohumeral joint Device: GE Logiq E  Ultrasound guided injection is preferred based studies that show increased duration, increased effect, greater accuracy, decreased procedural pain, increased response rate with ultrasound guided versus blind injection.  Verbal informed consent obtained.  Time-out conducted.  Noted no overlying erythema, induration, or other signs of local infection.  Skin prepped in a sterile fashion.  Local anesthesia: Topical Ethyl chloride.  With sterile technique and under real time ultrasound guidance:  Joint visualized.  23g 1  inch needle inserted posterior approach. Pictures taken for needle placement. Patient did have injection of 2 cc of 1% lidocaine, 2 cc of 0.5% Marcaine, and 1.0 cc of Kenalog 40 mg/dL. Completed without difficulty  Pain immediately resolved suggesting accurate placement of the medication.  Advised to call if fevers/chills, erythema, induration, drainage, or persistent bleeding.  Images permanently stored and available for review in the ultrasound unit.  Impression: Technically successful  ultrasound guided injection.  After informed written and verbal consent, patient was seated on exam table. Left knee was prepped with alcohol swab and utilizing anterolateral approach, patient's left knee space was injected with 4:1  marcaine 0.5%: Kenalog 40mg /dL. Patient tolerated the procedure well without immediate complications.    Impression and Recommendations:     This case required medical decision making of moderate complexity. The above documentation has been reviewed and is accurate and complete Lyndal Pulley, DO       Note: This dictation was prepared with Dragon dictation along with smaller phrase technology. Any transcriptional errors that result from this process are unintentional.

## 2018-07-01 ENCOUNTER — Encounter: Payer: Self-pay | Admitting: Family Medicine

## 2018-07-01 ENCOUNTER — Ambulatory Visit: Payer: Self-pay

## 2018-07-01 ENCOUNTER — Ambulatory Visit: Payer: Medicare HMO | Admitting: Family Medicine

## 2018-07-01 ENCOUNTER — Ambulatory Visit (INDEPENDENT_AMBULATORY_CARE_PROVIDER_SITE_OTHER)
Admission: RE | Admit: 2018-07-01 | Discharge: 2018-07-01 | Disposition: A | Discharge status: Home / Self Care | Payer: Medicare HMO | Source: Ambulatory Visit | Attending: Family Medicine | Admitting: Family Medicine

## 2018-07-01 VITALS — BP 104/60 | HR 72 | Ht 70.0 in | Wt 259.0 lb

## 2018-07-01 DIAGNOSIS — G8929 Other chronic pain: Secondary | ICD-10-CM | POA: Diagnosis not present

## 2018-07-01 DIAGNOSIS — M25562 Pain in left knee: Secondary | ICD-10-CM

## 2018-07-01 DIAGNOSIS — M1712 Unilateral primary osteoarthritis, left knee: Secondary | ICD-10-CM

## 2018-07-01 DIAGNOSIS — M7552 Bursitis of left shoulder: Secondary | ICD-10-CM | POA: Diagnosis not present

## 2018-07-01 DIAGNOSIS — M25512 Pain in left shoulder: Secondary | ICD-10-CM | POA: Diagnosis not present

## 2018-07-01 DIAGNOSIS — E118 Type 2 diabetes mellitus with unspecified complications: Secondary | ICD-10-CM

## 2018-07-01 NOTE — Assessment & Plan Note (Signed)
Repeat injection given today.  Tolerated the procedure well.  We discussed icing regimen and home exercise.  Discussed the possibility of topical anti-inflammatories.  Patient is going to wear the hinged brace that was given to him for more stability.  X-rays ordered today for further evaluation of the amount of arthritis.  Follow-up again with me in 4 to 6 weeks.  Could be a candidate for Visco supplementation if needed.

## 2018-07-01 NOTE — Assessment & Plan Note (Signed)
Injection given today.  Home exercise given, discussed keeping hands with a peripheral vision, we discussed which activities to do which wants to avoid.  Discussed avoiding heavy lifting away from the body.  Encouraged him to continue to go to the gym now and we discussed the importance of wearing the machine weights.  Encourage patient to continue to potentially lose weight.  Follow-up again in 4 to 6 weeks

## 2018-07-01 NOTE — Patient Instructions (Addendum)
Good to see you  [name] is your friend.  Injected the shoulder and the knee today  Xray of the knee today as well  Wart removal cream to the foot area daily for 1 week then try to file the soft skin away Podiatry will call you  See me again in 4-6 weeks

## 2018-07-04 ENCOUNTER — Ambulatory Visit (HOSPITAL_COMMUNITY)
Admission: RE | Admit: 2018-07-04 | Discharge: 2018-07-04 | Disposition: A | Discharge status: Home / Self Care | Payer: Medicare HMO | Source: Ambulatory Visit | Attending: Cardiology | Admitting: Cardiology

## 2018-07-04 VITALS — BP 120/76 | HR 76 | Wt 253.8 lb

## 2018-07-04 DIAGNOSIS — Z87891 Personal history of nicotine dependence: Secondary | ICD-10-CM | POA: Diagnosis not present

## 2018-07-04 DIAGNOSIS — R21 Rash and other nonspecific skin eruption: Secondary | ICD-10-CM | POA: Insufficient documentation

## 2018-07-04 DIAGNOSIS — I2510 Atherosclerotic heart disease of native coronary artery without angina pectoris: Secondary | ICD-10-CM | POA: Diagnosis not present

## 2018-07-04 DIAGNOSIS — I442 Atrioventricular block, complete: Secondary | ICD-10-CM | POA: Insufficient documentation

## 2018-07-04 DIAGNOSIS — I428 Other cardiomyopathies: Secondary | ICD-10-CM

## 2018-07-04 DIAGNOSIS — I481 Persistent atrial fibrillation: Secondary | ICD-10-CM | POA: Diagnosis not present

## 2018-07-04 DIAGNOSIS — E785 Hyperlipidemia, unspecified: Secondary | ICD-10-CM | POA: Diagnosis not present

## 2018-07-04 DIAGNOSIS — Z8371 Family history of colonic polyps: Secondary | ICD-10-CM | POA: Insufficient documentation

## 2018-07-04 DIAGNOSIS — N400 Enlarged prostate without lower urinary tract symptoms: Secondary | ICD-10-CM | POA: Insufficient documentation

## 2018-07-04 DIAGNOSIS — J449 Chronic obstructive pulmonary disease, unspecified: Secondary | ICD-10-CM | POA: Diagnosis not present

## 2018-07-04 DIAGNOSIS — Z7901 Long term (current) use of anticoagulants: Secondary | ICD-10-CM | POA: Diagnosis not present

## 2018-07-04 DIAGNOSIS — Z79899 Other long term (current) drug therapy: Secondary | ICD-10-CM | POA: Insufficient documentation

## 2018-07-04 DIAGNOSIS — I482 Chronic atrial fibrillation: Secondary | ICD-10-CM | POA: Diagnosis not present

## 2018-07-04 DIAGNOSIS — Z95810 Presence of automatic (implantable) cardiac defibrillator: Secondary | ICD-10-CM | POA: Insufficient documentation

## 2018-07-04 DIAGNOSIS — Z8349 Family history of other endocrine, nutritional and metabolic diseases: Secondary | ICD-10-CM | POA: Diagnosis not present

## 2018-07-04 DIAGNOSIS — Z825 Family history of asthma and other chronic lower respiratory diseases: Secondary | ICD-10-CM | POA: Insufficient documentation

## 2018-07-04 DIAGNOSIS — I5022 Chronic systolic (congestive) heart failure: Secondary | ICD-10-CM | POA: Insufficient documentation

## 2018-07-04 DIAGNOSIS — K219 Gastro-esophageal reflux disease without esophagitis: Secondary | ICD-10-CM | POA: Insufficient documentation

## 2018-07-04 DIAGNOSIS — I110 Hypertensive heart disease with heart failure: Secondary | ICD-10-CM | POA: Insufficient documentation

## 2018-07-04 DIAGNOSIS — I4819 Other persistent atrial fibrillation: Secondary | ICD-10-CM

## 2018-07-04 DIAGNOSIS — Z6836 Body mass index (BMI) 36.0-36.9, adult: Secondary | ICD-10-CM | POA: Insufficient documentation

## 2018-07-04 DIAGNOSIS — Z9981 Dependence on supplemental oxygen: Secondary | ICD-10-CM | POA: Insufficient documentation

## 2018-07-04 MED ORDER — TORSEMIDE 20 MG PO TABS: 40.0000 mg | ORAL_TABLET | Freq: Two times a day (BID) | ORAL | 3 refills | Status: DC

## 2018-07-04 MED ORDER — ROSUVASTATIN CALCIUM 5 MG PO TABS: 5.0000 mg | ORAL_TABLET | Freq: Every day | ORAL | 5 refills | Status: DC

## 2018-07-04 MED ORDER — SACUBITRIL-VALSARTAN 24-26 MG PO TABS: 1.0000 | ORAL_TABLET | Freq: Two times a day (BID) | ORAL | 5 refills | Status: DC

## 2018-07-04 NOTE — Progress Notes (Signed)
[facility] PREP Weekly Session   Patient Details  Name: [name] MRN: [id number] Date of Birth: [birth year] Age: 62 y.o. PCP: [name], MD  Vitals:   07/01/18 1444  Weight: 257 lb 3.2 oz (116.7 kg)    [facility] Weekly seesion - 07/04/18 1400      Weekly Session   Topic Discussed  Finding support    Minutes exercised this week  120 minutes      Fun things you did since last meeting:"church/playing music" Things you are grateful for:"everyday"  Vanita Ingles 07/04/2018, 2:45 PM

## 2018-07-04 NOTE — Patient Instructions (Addendum)
1. Start Crestor 5 mg daily. 2. STOP Losartan. 3. Start Entresto 24-26 twice a day. 4. Once you start your Entresto please decrease Torsemide to 40 mg in the morning and 20 mg in the evening. 5. Return to clinic in 10 days for labs. (BMET, Digoxin) 6. Return to clinic in 2 months for labs. (lipids, LFTs) 7. Return to clinic for a visit with Dr. [name] in 3 months.

## 2018-07-05 ENCOUNTER — Other Ambulatory Visit (HOSPITAL_COMMUNITY): Payer: Self-pay | Admitting: Cardiology

## 2018-07-06 NOTE — Progress Notes (Signed)
PCP: Dr. [name] EP: [name] HF Cardiology: [name]  62 y.o. with history of COPD on home oxygen, permanent atrial fibrillation, and chronic systolic CHF was referred by Dr. [name] for evaluation of CHF.  Amiodarone and DCCV were tried for atrial fibrillation in the past without success.   Patient has been noted to have a low EF since 2013.  EF improved by to normal by 2015 but dropped to 30-35% by 11/16.  Cath in 6/17 showed some CAD but not enough to cause cardiomyopathy.  He was admitted in 3/18 with PNA, atrial fibrillation/RVR, and acute on chronic systolic CHF.  Echo in 3/18 showed fall in EF to 10-15%.   I took him for right and left heart cath in 4/18, which showed nonobstructive CAD and relatively optimized filling pressures.    Syncope in 11/18. Loop recorder showed 6 second pause and periods of complete heart block. He had Medtronic BiV ICD placed in 12/18.  In 1/19, he had AV nodal ablation to promote BiV pacing.   Echo was done 6/19 with EF 25-30% with moderate RV dilation/mildly decreased systolic function. CPX 7/19 was submaximal, probably mild functional impairment.   Patient is seen today in followup of CHF/dyspnea. Has OSA but cannot tolerate CPAP, uses oxygen at night.  Skin has cleared up with methotrexate, itching much improved. He is now doing with [employer] [facility] class.  He has done very well with this, stamina improving.  Weight down 7 lbs.  He can walk a mile now without dyspnea.  No chest pain.  No orthopnea/PND. SBP running 105-115 at home.  He had myalgias with atorvastatin and Zetia, stopped both.   Labs (4/18): pro-BNP 904, K 3.1, creatinine 1.39 => 1.08 with BUN 66 => 37, hgb 14.1.  Labs (5/18): hemoglobin 13.1 Labs (6/18): K 4, creatinine 1.3, BNP 294 Labs (7/18): K 3.8, creatinine 1.23, LDL 96, HDL 42 Labs (8/18): K 4.5, creatinine 1.22 Labs (10/18): digoxin 0.4 Labs (12/18): LDL 115, HDL 37, TGs 214, K 4.2, creatinine 0.84 Labs (1/19): K 4.4, creatinine 0.75, pro-BNP  433 Labs (2/19): digoxin 0.4 Labs (3/19): LDL 121, hgb 12.8, K 4.5, creatinine 0.98 Labs (6/19): LDL 48, HDL 45, K 5, creatinine 0.94, digoxin 0.3 Labs (9/19): K 3.7, creatinine 1.09  PMH: 1. Asthma 2. COPD: On home oxygen. Prior smoker.  3. Atrial fibrillation: Permanent.  He failed DCCV and amiodarone in the past . 4. HTN 5. GERD 6. Hyperlipidemia: Myalgias with atorvastatin 7. BPH.  8. Chronic systolic CHF: Nonischemic cardiomyopathy.  - EF 25% by TEE in 7/13.  Normalized on 2015 echo. - Echo (11/16): EF 30-35%.  - LHC/RHC (6/17): 80% ostial stenosis small D1, 30-40% pLAD.  Mean RA 4, PA 26/10, mean PCWP 11, CI 2.33.  - Echo (3/18): EF 10-15%, diffuse hypokinesis, severe LV dilation, moderate RV dilation with severely decreased systolic function.  - LHC/RHC (4/18): 40% proximal LAD.  Mean RA 8, PA 37/10 mean 22, mean PCWP 22, LVEDP 9, CI 2.39. - Medtronic BiV ICD placement in 12/18 with AV nodal ablation to promote BiV pacing in 1/19.  - Echo (6/19): EF 25-30% with mild LV dilation, moderately dilated RV with mildly decreased systolic function, IVC not dilated, PASP 23 mmHg.  - CPX (7/19): peak VO2 17.6 (78% predicted), VE/VCO2 slope 31, RER 1.02 => submaximal, probably mild functional impairment.  9. Morbid obesity 10. ILR in place.  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 Complete heart block: s/p Medtronic CRT-D.   Social History   Socioeconomic  History  . Marital status: Divorced    Spouse name: Not on file  . Number of children: 2  . Years of education: Not on file  . Highest education level: Not on file  Occupational History  . Occupation: retired/disabled. prev worked in Therapist, sports.    Employer: DISABLED  Social Needs  . Financial resource strain: Not on file  . Food insecurity:    Worry: Not on file    Inability: Not on file  . Transportation needs:    Medical: Not on file    Non-medical: Not on file  Tobacco Use  . Smoking status: Former Smoker     Packs/day: 2.00    Years: 30.00    Pack years: 60.00    Types: Cigarettes    Last attempt to quit: 10/16/2007    Years since quitting: 10.7  . Smokeless tobacco: Never Used  Substance and Sexual Activity  . Alcohol use: No  . Drug use: No  . Sexual activity: Not Currently  Lifestyle  . Physical activity:    Days per week: Not on file    Minutes per session: Not on file  . Stress: Not on file  Relationships  . Social connections:    Talks on phone: Not on file    Gets together: Not on file    Attends religious service: Not on file    Active member of club or organization: Not on file    Attends meetings of clubs or organizations: Not on file    Relationship status: Not on file  . Intimate partner violence:    Fear of current or ex partner: Not on file    Emotionally abused: Not on file    Physically abused: Not on file    Forced sexual activity: Not on file  Other Topics Concern  . Not on file  Social History Narrative   Lives alone.   Family History  Problem Relation Age of Onset  . COPD Mother   . Asthma Mother   . Colon polyps Mother   . Allergies Mother   . Hypothyroidism Mother   . Asthma Maternal Grandmother   . Colon cancer Neg Hx    ROS: All systems reviewed and negative except as per HPI.   Current Outpatient Medications  Medication Sig Dispense Refill  . acetaminophen (TYLENOL) 500 MG tablet Take 500 mg by mouth every 6 (six) hours as needed.    [name] albuterol (VENTOLIN HFA) 108 (90 Base) MCG/ACT inhaler INHALE 2 PUFFS 4 TIMES A DAY as needed (Patient taking differently: Inhale 2 puffs into the lungs every 6 (six) hours as needed for wheezing or shortness of breath. INHALE 2 PUFFS 4 TIMES A DAY as needed) 54 Inhaler 3  . ALPRAZolam (XANAX) 0.5 MG tablet TAKE 1mg   BY MOUTH EVERY DAY AT BEDTIME 180 tablet 1  . carisoprodol (SOMA) 350 MG tablet TAKE 1 TABLET BY MOUTH 3 TIMES A DAY AS NEEDED FOR MUSCLE SPASM 90 tablet 5  . carvedilol (COREG) 12.5 MG tablet Take 1  tablet (12.5 mg total) by mouth 2 (two) times daily with a meal. 60 tablet 3  . clobetasol ointment (TEMOVATE) 2.53 % Apply 1 application topically 2 (two) times daily. 60 g 1  . digoxin (LANOXIN) 0.25 MG tablet Take 0.5 tablets (0.125 mg total) by mouth daily. 30 tablet 11  . doxepin (SINEQUAN) 10 MG capsule TAKE 2 CAPSULES BY MOUTH AT BEDTIME. 180 capsule 2  . ezetimibe (ZETIA) 10 MG tablet Take  1 tablet (10 mg total) by mouth daily. 90 tablet 3  . fluticasone (FLONASE) 50 MCG/ACT nasal spray Place 2 sprays into both nostrils daily as needed for allergies or rhinitis. 16 g 3  . Fluticasone-Salmeterol (ADVAIR DISKUS) 250-50 MCG/DOSE AEPB Inhale 1 puff into the lungs 2 (two) times daily. (Patient taking differently: Inhale 1 puff into the lungs 2 (two) times daily as needed (shortness of breath). ) 180 each 3  . hydrocortisone 2.5 % cream APPLY TO AFFECTED AREA TWICE A DAY 28.35 g 5  . hydrOXYzine (ATARAX/VISTARIL) 10 MG tablet TAKE 1 TABLET BY MOUTH THREE TIMES A DAY AS NEEDED 60 tablet 2  . lidocaine (LIDODERM) 5 % APPLY 1 PATCH TO AFFECTED AREA FOR UP TO 12 HOURS . DO NOT USE MORE THAN ONE PATCH IN 24 HOURS 90 patch 1  . methotrexate (RHEUMATREX) 2.5 MG tablet TAKE 4 TABS EVERY WEEK (Patient taking differently: TAKE 6 TABS EVERY WEEK) 16 tablet 0  . omeprazole (PRILOSEC) 20 MG capsule Take 1 capsule (20 mg total) by mouth daily with supper. 90 capsule 1  . sertraline (ZOLOFT) 100 MG tablet TAKE 2 TABLETS BY MOUTH AT BEDTIME 180 tablet 3  . silver sulfADIAZINE (SILVADENE) 1 % cream APPLY TO AFFECTED AREA TOPICALLY EVERY DAY 50 g 3  . spironolactone (ALDACTONE) 25 MG tablet Take 1 tablet (25 mg total) by mouth daily. 30 tablet 3  . torsemide (DEMADEX) 20 MG tablet Take 2 tablets (40 mg total) by mouth 2 (two) times daily. Take 2 tablets in the morning (40mg ) and 1 tablet (20 mg) in the evening. 120 tablet 3  . traMADol (ULTRAM) 50 MG tablet TAKE 1 TABLET BY MOUTH EVERY 6 HOURS AS NEEDED 30 tablet 2   . traZODone (DESYREL) 100 MG tablet TAKE 1/2 TABLET BY MOUTH EVERY DAY AT BEDTIME AS NEEDED 45 tablet 1  . traZODone (DESYREL) 50 MG tablet Take 1 tablet (50 mg total) by mouth at bedtime as needed for sleep. 90 tablet 1  . TUMS 500 MG chewable tablet Chew 500-2,000 mg by mouth every 4 (four) hours as needed for indigestion or heartburn.     . valACYclovir (VALTREX) 1000 MG tablet TAKE 1 TABLET BY MOUTH THREE TIMES A DAY 21 tablet 2  . venlafaxine XR (EFFEXOR-XR) 37.5 MG 24 hr capsule TAKE ONE CAPSULE BY MOUTH EVERY DAY WITH BREAKFAST 90 capsule 1  . warfarin (COUMADIN) 5 MG tablet Take as directed by anticoagulation clinic. 35 tablet 3  . zolpidem (AMBIEN) 10 MG tablet TAKE 1 TABLET BY MOUTH EVERYDAY AT BEDTIME 90 tablet 1  . rosuvastatin (CRESTOR) 5 MG tablet Take 1 tablet (5 mg total) by mouth daily. 30 tablet 5  . sacubitril-valsartan (ENTRESTO) 24-26 MG Take 1 tablet by mouth 2 (two) times daily. 60 tablet 5   No current facility-administered medications for this encounter.    BP 120/76   Pulse 76   Wt 115.1 kg (253 lb 12.8 oz)   SpO2 96%   BMI 36.42 kg/m  General: NAD Neck: No JVD, no thyromegaly or thyroid nodule.  Lungs: Clear to auscultation bilaterally with normal respiratory effort. CV: Nondisplaced PMI.  Heart regular S1/S2, no S3/S4, no murmur.  No peripheral edema.  No carotid bruit.  Normal pedal pulses.  Abdomen: Soft, nontender, no hepatosplenomegaly, no distention.  Skin: Intact without lesions or rashes.  Neurologic: Alert and oriented x 3.  Psych: Normal affect. Extremities: No clubbing or cyanosis.  HEENT: Normal.   Assessment/Plan:  1.  Atrial fibrillation: Permanent.  Now s/p AV nodal ablation to allow effective BiV pacing.  - We discussed switching from warfarin to DOAC, but it appears that DOACs would end up being too expensive for him.  2. COPD: No longer smoking.  He is on home oxygen at night.   3. Chronic systolic CHF: Nonischemic cardiomyopathy based on  6/17 cath. However, he had a recent significant fall in EF from 30-35% to 10-15% on echo from 3/18.  LHC/RHC in 4/18 showed nonobstructive CAD and relatively optimized filling pressures.  He now has Medtronic CRT-D device with AV nodal ablation to allow BiV pacing.  Echo 6/19 with EF 25-30% with mildly decreased RV systolic function. CPX 7/19 was submaximal but likely only mild functional limitation from CHF.  He is not volume overloaded by exam.  NYHA class II symptoms.  Weight is down, doing very well with [employer] [facility] class.  - Continue Coreg 12.5 mg bid.   - Continue spironolactone 25 mg daily.        - D/c losartan, start Entresto 24/26 bid.  BMET today and again in 10 days.  - Can decrease torsemide to 40 qam/20 qpm with initiation of Entresto.  - Continue digoxin 0.125 daily, check level.  4. CAD: Nonobstructive on 4/18 cath.  No exertional chest pain.  - He is on warfarin so no ASA.  - Stopped atorvastatin and Zetia due to myalgias.  I will try him on Crestor 5 mg daily.  If he has myalgias with this, will need Repatha.  Check LFTs/lipids in 2 months.   5. Complete heart block: Now has Medtronic CRT-D device and s/p AV nodal ablation.  6. Rash: Spongiotic dermatitis.  Now on steroids and methotrexate, much improved. .   Followup in 3 months.   Loralie Champagne 07/06/2018

## 2018-07-07 ENCOUNTER — Telehealth (HOSPITAL_COMMUNITY): Payer: Self-pay | Admitting: Pharmacist

## 2018-07-07 NOTE — Telephone Encounter (Signed)
Entresto 24-26 mg BID PA approved by [facility] Part D through 07/06/20.   Ruta Hinds. Velva Harman, PharmD, BCPS, CPP Clinical Pharmacist Phone: [phone] 07/07/2018 11:16 AM

## 2018-07-08 ENCOUNTER — Other Ambulatory Visit: Payer: Self-pay | Admitting: Internal Medicine

## 2018-07-09 NOTE — Telephone Encounter (Signed)
MD approved and sent electronically to pof../lmb  

## 2018-07-09 NOTE — Telephone Encounter (Signed)
Done erx 

## 2018-07-13 DIAGNOSIS — G4733 Obstructive sleep apnea (adult) (pediatric): Secondary | ICD-10-CM | POA: Diagnosis not present

## 2018-07-14 ENCOUNTER — Ambulatory Visit (HOSPITAL_COMMUNITY)
Admission: RE | Admit: 2018-07-14 | Discharge: 2018-07-14 | Disposition: A | Discharge status: Home / Self Care | Payer: Medicare HMO | Source: Ambulatory Visit | Attending: Internal Medicine | Admitting: Internal Medicine

## 2018-07-14 DIAGNOSIS — I428 Other cardiomyopathies: Secondary | ICD-10-CM | POA: Insufficient documentation

## 2018-07-14 LAB — BASIC METABOLIC PANEL
ANION GAP: 10 (ref 5–15)
BUN: 21 mg/dL (ref 8–23)
CO2: 23 mmol/L (ref 22–32)
Calcium: 8.7 mg/dL — ABNORMAL LOW (ref 8.9–10.3)
Chloride: 103 mmol/L (ref 98–111)
Creatinine, Ser: 1.32 mg/dL — ABNORMAL HIGH (ref 0.61–1.24)
GFR, EST NON AFRICAN AMERICAN: 56 mL/min — AB (ref >60)
Glucose, Bld: 91 mg/dL (ref 70–99)
POTASSIUM: 4.6 mmol/L (ref 3.5–5.1)
Sodium: 136 mmol/L (ref 135–145)

## 2018-07-14 LAB — DIGOXIN LEVEL: Digoxin Level: 0.2 ng/mL — ABNORMAL LOW (ref 0.8–2.0)

## 2018-07-15 LAB — CUP PACEART REMOTE DEVICE CHECK
Brady Statistic AP VS Percent: 0 %
Brady Statistic AS VP Percent: 0 %
Brady Statistic RA Percent Paced: 0 %
Brady Statistic RV Percent Paced: 99.97 %
Date Time Interrogation Session: 20190905062605
HighPow Impedance: 85 Ohm
Implantable Lead Implant Date: 20181205
Implantable Lead Location: 753860
Lead Channel Impedance Value: 1007 Ohm
Lead Channel Impedance Value: 1254 Ohm
Lead Channel Impedance Value: 1292 Ohm
Lead Channel Impedance Value: 250.943
Lead Channel Impedance Value: 316.667
Lead Channel Impedance Value: 322.756
Lead Channel Impedance Value: 4047 Ohm
Lead Channel Impedance Value: 532 Ohm
Lead Channel Impedance Value: 950 Ohm
Lead Channel Pacing Threshold Pulse Width: 0.4 ms
Lead Channel Sensing Intrinsic Amplitude: 6 mV
Lead Channel Setting Pacing Amplitude: 2.5 V
Lead Channel Setting Sensing Sensitivity: 0.3 mV
MDC IDC LEAD IMPLANT DT: 20181205
MDC IDC LEAD LOCATION: 753858
MDC IDC MSMT BATTERY REMAINING LONGEVITY: 88 mo
MDC IDC MSMT BATTERY VOLTAGE: 3.01 V
MDC IDC MSMT LEADCHNL LV IMPEDANCE VALUE: 1083 Ohm
MDC IDC MSMT LEADCHNL LV IMPEDANCE VALUE: 1406 Ohm
MDC IDC MSMT LEADCHNL LV IMPEDANCE VALUE: 1406 Ohm
MDC IDC MSMT LEADCHNL LV IMPEDANCE VALUE: 341.026
MDC IDC MSMT LEADCHNL LV IMPEDANCE VALUE: 348.099
MDC IDC MSMT LEADCHNL LV IMPEDANCE VALUE: 475 Ohm
MDC IDC MSMT LEADCHNL LV IMPEDANCE VALUE: 950 Ohm
MDC IDC MSMT LEADCHNL RV IMPEDANCE VALUE: 399 Ohm
MDC IDC MSMT LEADCHNL RV IMPEDANCE VALUE: 513 Ohm
MDC IDC MSMT LEADCHNL RV PACING THRESHOLD AMPLITUDE: 0.5 V
MDC IDC MSMT LEADCHNL RV SENSING INTR AMPL: 6 mV
MDC IDC PG IMPLANT DT: 20181205
MDC IDC SET LEADCHNL LV PACING AMPLITUDE: 2 V
MDC IDC SET LEADCHNL LV PACING PULSEWIDTH: 0.6 ms
MDC IDC SET LEADCHNL RV PACING PULSEWIDTH: 0.4 ms
MDC IDC STAT BRADY AP VP PERCENT: 0 %
MDC IDC STAT BRADY AS VS PERCENT: 0 %

## 2018-07-15 NOTE — Progress Notes (Signed)
[facility] [employer] [facility] Weekly Session   Patient Details  Name: [name] MRN: [id number] Date of Birth: Jul 21, 1956 Age: 62 y.o. PCP: [name], MD  Vitals:   07/08/18 1334  Weight: 253 lb 3.2 oz (114.9 kg)    [employer] Weekly seesion - 07/15/18 1300      Weekly Session   Minutes exercised this week  --   2hrs/3x's week     [name] has been very consistently coming to class and working out!  Vanita Ingles 07/15/2018, 1:34 PM

## 2018-07-18 ENCOUNTER — Ambulatory Visit (INDEPENDENT_AMBULATORY_CARE_PROVIDER_SITE_OTHER): Payer: Medicare HMO | Admitting: General Practice

## 2018-07-18 DIAGNOSIS — Z7901 Long term (current) use of anticoagulants: Secondary | ICD-10-CM

## 2018-07-18 DIAGNOSIS — Z23 Encounter for immunization: Secondary | ICD-10-CM | POA: Diagnosis not present

## 2018-07-18 LAB — POCT INR: INR: 6.1 — AB (ref 2.0–3.0)

## 2018-07-18 NOTE — Progress Notes (Signed)
Agree with management.  Zachary Earnhardt J Merwin Breden, MD  

## 2018-07-18 NOTE — Patient Instructions (Addendum)
Pre visit review using our clinic review tool, if applicable. No additional management support is needed unless otherwise documented below in the visit note.  Hold coumadin through Monday.  On Tuesday start taking 1 tablet daily except 1/2 tablet on Monday and Fridays and re-check on 10/15.  Go to ER if any unusual bleeding occurs.

## 2018-07-22 ENCOUNTER — Other Ambulatory Visit: Payer: Self-pay | Admitting: Internal Medicine

## 2018-07-24 DIAGNOSIS — J449 Chronic obstructive pulmonary disease, unspecified: Secondary | ICD-10-CM | POA: Diagnosis not present

## 2018-07-28 NOTE — Progress Notes (Signed)
[name] [address] [place], [place] [address] Phone: [phone] Subjective:    I [name] am serving as a Education administrator for Dr. Hulan Saas.    CC: Bilateral shoulder pain  ELF:[id number]  [name] is a 62 y.o. male coming in with complaint of chronic bilateral shoulder pain. Wants bilateral injections.  Patient has had bilateral shoulder injections previously.  Worsening pain at this time.  Patient is trying to work on a regular basis but finding it difficult secondary to the discomfort and pain.  Rates the severity pain is 8 out of 10.  Waking him up at night.  Affecting daily activities.       Past Medical History:  Diagnosis Date  . AICD (automatic cardioverter/defibrillator) present   . Anemia    supposed to be taking Vit B but doesn't  . ANXIETY    takes Xanax nightly  . Arthritis   . Asthma    Albuterol prn and Advair daily;also takes Prednisone daily  . Atrial fibrillation ([place]) 09/06/2015  . Cardiomyopathy [facility])    a. EF 25% TEE July 2013; b. EF normalized 2015;  c. 03/2015 Echo: EF 40-45%, difrf HK, PASP 38 mmHg, Mild MR, sev LAE/RAE.  [name] Chronic constipation    takes OTC stool softener  . COPD (chronic obstructive pulmonary disease) ([place])    "one dr says COPD; one dr says emphysema" (09/18/2017)  . DEPRESSION    takes Zoloft and Doxepin daily  . Diverticulitis   . DYSKINESIA, ESOPHAGUS   . Emphysema of lung [facility])    "one dr says COPD; one dr says emphysema" (09/18/2017)  . Essential hypertension       . FIBROMYALGIA   . GERD (gastroesophageal reflux disease)       . Glaucoma   . HYPERLIPIDEMIA    a. Intolerant to statins.  . INSOMNIA    takes Ambien nightly  . Myocardial infarction [facility])    a. 2012 Myoview notable for prior infarct;  b. 03/2015 Lexiscan CL: EF 37%, diff HK, small area of inferior infarct from apex to base-->Med Rx.  . Myocardial infarction ([place])   . O2 dependent    "2.5L q hs & prn" (09/18/2017)    . Paroxysmal atrial fibrillation (HCC)    a. CHA2DS2VASc = 3--> takes Coumadin;  b. 03/15/2015 Successful TEE/DCCV;  c. 03/2015 recurrent afib, Amio d/c'd in setting of hyperthyroidism.  . Peripheral neuropathy   . Pneumonia 12/2016  . Rash and other nonspecific skin eruption 04/12/2009   no cause found saw dermatologists x 2 and allergist  . SLEEP APNEA, OBSTRUCTIVE    a. doesn't use CPAP  . Syncope    a. 03/2015 s/p MDT LINQ.  [name] Type II diabetes mellitus ([place])        Past Surgical History:  Procedure Laterality Date  . ACNE CYST REMOVAL     2 on back   . AV NODE ABLATION N/A 10/25/2017   Procedure: AV NODE ABLATION;  Surgeon: Deboraha Sprang, MD;  Location: [place] CV LAB;  Service: Cardiovascular;  Laterality: N/A;  . BIV ICD INSERTION CRT-D N/A 09/18/2017   Procedure: BIV ICD INSERTION CRT-D;  Surgeon: Deboraha Sprang, MD;  Location: [place] CV LAB;  Service: Cardiovascular;  Laterality: N/A;  . CARDIAC CATHETERIZATION N/A 03/21/2016   Procedure: Right/Left Heart Cath and Coronary Angiography;  Surgeon: Larey Dresser, MD;  Location: [place] CV LAB;  Service: Cardiovascular;  Laterality: N/A;  .  CARDIOVERSION  04/18/2012   Procedure: CARDIOVERSION;  Surgeon: Fay Records, MD;  Location: [place];  Service: Cardiovascular;  Laterality: N/A;  . CARDIOVERSION  04/25/2012   Procedure: CARDIOVERSION;  Surgeon: Thayer Headings, MD;  Location: [place];  Service: Cardiovascular;  Laterality: N/A;  . CARDIOVERSION  04/25/2012   Procedure: CARDIOVERSION;  Surgeon: Fay Records, MD;  Location: [place];  Service: Cardiovascular;  Laterality: N/A;  . CARDIOVERSION  05/09/2012   Procedure: CARDIOVERSION;  Surgeon: Sherren Mocha, MD;  Location: [place];  Service: Cardiovascular;  Laterality: N/A;  changed from crenshaw to cooper by trish/leone-endo  . CARDIOVERSION N/A 03/15/2015   Procedure: CARDIOVERSION;  Surgeon: Thayer Headings, MD;  Location: [facility] ENDOSCOPY;  Service: Cardiovascular;   Laterality: N/A;  . COLONOSCOPY    . COLONOSCOPY WITH PROPOFOL N/A 10/21/2014   Procedure: COLONOSCOPY WITH PROPOFOL;  Surgeon: Ladene Artist, MD;  Location: [facility] ENDOSCOPY;  Service: Endoscopy;  Laterality: N/A;  . EP IMPLANTABLE DEVICE N/A 04/06/2015   Procedure: Loop Recorder Insertion;  Surgeon: Evans Lance, MD;  Location: [place] CV LAB;  Service: Cardiovascular;  Laterality: N/A;  . ESOPHAGOGASTRODUODENOSCOPY    . JOINT REPLACEMENT    . LOOP RECORDER REMOVAL N/A 09/18/2017   Procedure: LOOP RECORDER REMOVAL;  Surgeon: Deboraha Sprang, MD;  Location: [place] CV LAB;  Service: Cardiovascular;  Laterality: N/A;  . RIGHT/LEFT HEART CATH AND CORONARY ANGIOGRAPHY N/A 01/28/2017   Procedure: Right/Left Heart Cath and Coronary Angiography;  Surgeon: Larey Dresser, MD;  Location: [place] CV LAB;  Service: Cardiovascular;  Laterality: N/A;  . TEE WITHOUT CARDIOVERSION  04/25/2012   Procedure: TRANSESOPHAGEAL ECHOCARDIOGRAM (TEE);  Surgeon: Thayer Headings, MD;  Location: [place];  Service: Cardiovascular;  Laterality: N/A;  . TEE WITHOUT CARDIOVERSION N/A 03/15/2015   Procedure: TRANSESOPHAGEAL ECHOCARDIOGRAM (TEE);  Surgeon: Thayer Headings, MD;  Location: [place];  Service: Cardiovascular;  Laterality: N/A;  . TONSILLECTOMY AND ADENOIDECTOMY    . TOTAL KNEE ARTHROPLASTY Right 06/15/2014   Procedure: TOTAL KNEE ARTHROPLASTY;  Surgeon: Renette Butters, MD;  Location: [place];  Service: Orthopedics;  Laterality: Right;   Social History   Socioeconomic History  . Marital status: Divorced    Spouse name: Not on file  . Number of children: 2  . Years of education: Not on file  . Highest education level: Not on file  Occupational History  . Occupation: retired/disabled. prev worked in Therapist, sports.    Employer: DISABLED  Social Needs  . Financial resource strain: Not on file  . Food insecurity:    Worry: Not on file    Inability: Not on file  . Transportation needs:      Medical: Not on file    Non-medical: Not on file  Tobacco Use  . Smoking status: Former Smoker    Packs/day: 2.00    Years: 30.00    Pack years: 60.00    Types: Cigarettes    Last attempt to quit: 10/16/2007    Years since quitting: 10.7  . Smokeless tobacco: Never Used  Substance and Sexual Activity  . Alcohol use: No  . Drug use: No  . Sexual activity: Not Currently  Lifestyle  . Physical activity:    Days per week: Not on file    Minutes per session: Not on file  . Stress: Not on file  Relationships  . Social connections:    Talks on phone: Not on file    Gets together:  Not on file    Attends religious service: Not on file    Active member of club or organization: Not on file    Attends meetings of clubs or organizations: Not on file    Relationship status: Not on file  Other Topics Concern  . Not on file  Social History Narrative   Lives alone.   Allergies  Allergen Reactions  . Amiodarone Other (See Comments)    hyperthyroidism  . Statins Other (See Comments)    myalgia  . Tape Other (See Comments)    Skin Tears Use Paper Tape Only   Family History  Problem Relation Age of Onset  . COPD Mother   . Asthma Mother   . Colon polyps Mother   . Allergies Mother   . Hypothyroidism Mother   . Asthma Maternal Grandmother   . Colon cancer Neg Hx      Current Outpatient Medications (Cardiovascular):  .  carvedilol (COREG) 12.5 MG tablet, TAKE 1 TABLET (12.5 MG TOTAL) BY MOUTH 2 (TWO) TIMES DAILY WITH A MEAL. [name]  digoxin (LANOXIN) 0.25 MG tablet, Take 0.5 tablets (0.125 mg total) by mouth daily. [name]  ezetimibe (ZETIA) 10 MG tablet, Take 1 tablet (10 mg total) by mouth daily. .  rosuvastatin (CRESTOR) 5 MG tablet, Take 1 tablet (5 mg total) by mouth daily. .  sacubitril-valsartan (ENTRESTO) 24-26 MG, Take 1 tablet by mouth 2 (two) times daily. [name]  spironolactone (ALDACTONE) 25 MG tablet, Take 1 tablet (25 mg total) by mouth daily. [name]  torsemide (DEMADEX) 20 MG  tablet, Take 2 tablets (40 mg total) by mouth 2 (two) times daily. Take 2 tablets in the morning (40mg ) and 1 tablet (20 mg) in the evening.  Current Outpatient Medications (Respiratory):  .  albuterol (VENTOLIN HFA) 108 (90 Base) MCG/ACT inhaler, INHALE 2 PUFFS 4 TIMES A DAY as needed (Patient taking differently: Inhale 2 puffs into the lungs every 6 (six) hours as needed for wheezing or shortness of breath. INHALE 2 PUFFS 4 TIMES A DAY as needed) .  fluticasone (FLONASE) 50 MCG/ACT nasal spray, Place 2 sprays into both nostrils daily as needed for allergies or rhinitis. .  Fluticasone-Salmeterol (ADVAIR DISKUS) 250-50 MCG/DOSE AEPB, Inhale 1 puff into the lungs 2 (two) times daily. (Patient taking differently: Inhale 1 puff into the lungs 2 (two) times daily as needed (shortness of breath). )  Current Outpatient Medications (Analgesics):  .  acetaminophen (TYLENOL) 500 MG tablet, Take 500 mg by mouth every 6 (six) hours as needed. .  traMADol (ULTRAM) 50 MG tablet, TAKE 1 TABLET BY MOUTH EVERY 6 HOURS AS NEEDED  Current Outpatient Medications (Hematological):  .  warfarin (COUMADIN) 5 MG tablet, Take as directed by anticoagulation clinic.  Current Outpatient Medications (Other):  [name]  ALPRAZolam (XANAX) 0.5 MG tablet, TAKE 1mg   BY MOUTH EVERY DAY AT BEDTIME .  carisoprodol (SOMA) 350 MG tablet, TAKE 1 TABLET BY MOUTH 3 TIMES A DAY AS NEEDED FOR MUSCLE SPASM .  clobetasol ointment (TEMOVATE) 1.61 %, Apply 1 application topically 2 (two) times daily. [name]  doxepin (SINEQUAN) 10 MG capsule, TAKE 2 CAPSULES BY MOUTH AT BEDTIME. .  hydrocortisone 2.5 % cream, APPLY TO AFFECTED AREA TWICE A DAY .  hydrOXYzine (ATARAX/VISTARIL) 10 MG tablet, TAKE 1 TABLET BY MOUTH THREE TIMES A DAY AS NEEDED .  ketoconazole (NIZORAL) 2 % cream, APPLY TO AFFECTED AREA EVERY DAY .  lidocaine (LIDODERM) 5 %, APPLY 1 PATCH TO AFFECTED AREA FOR UP TO  12 HOURS . DO NOT USE MORE THAN ONE PATCH IN 24 HOURS .  methotrexate  (RHEUMATREX) 2.5 MG tablet, TAKE 4 TABS EVERY WEEK (Patient taking differently: TAKE 6 TABS EVERY WEEK) .  omeprazole (PRILOSEC) 20 MG capsule, Take 1 capsule (20 mg total) by mouth daily with supper. .  sertraline (ZOLOFT) 100 MG tablet, TAKE 2 TABLETS BY MOUTH AT BEDTIME .  silver sulfADIAZINE (SILVADENE) 1 % cream, APPLY TO AFFECTED AREA TOPICALLY EVERY DAY .  traZODone (DESYREL) 100 MG tablet, TAKE 1/2 TABLET BY MOUTH EVERY DAY AT BEDTIME AS NEEDED .  traZODone (DESYREL) 50 MG tablet, Take 1 tablet (50 mg total) by mouth at bedtime as needed for sleep. .  TUMS 500 MG chewable tablet, Chew 500-2,000 mg by mouth every 4 (four) hours as needed for indigestion or heartburn.  .  valACYclovir (VALTREX) 1000 MG tablet, TAKE 1 TABLET BY MOUTH THREE TIMES A DAY .  venlafaxine XR (EFFEXOR-XR) 37.5 MG 24 hr capsule, TAKE ONE CAPSULE BY MOUTH EVERY DAY WITH BREAKFAST .  zolpidem (AMBIEN) 10 MG tablet, TAKE 1 TABLET BY MOUTH EVERYDAY AT BEDTIME    Past medical history, social, surgical and family history all reviewed in electronic medical record.  No pertanent information unless stated regarding to the chief complaint.   Review of Systems:  No headache, visual changes, nausea, vomiting, diarrhea, constipation, dizziness, abdominal pain, skin rash, fevers, chills, night sweats, weight loss, swollen lymph nodes, body aches, joint swelling,chest pain, shortness of breath, mood changes.  Positive muscle aches  Objective  Blood pressure 100/70, pulse 72, height 5\' 10"  (1.778 m), weight 247 lb (112 kg), SpO2 96 %.   General: No apparent distress alert and oriented x3 mood and affect normal, dressed appropriately.  HEENT: Pupils equal, extraocular movements intact  Respiratory: Patient's speak in full sentences and does not appear short of breath  Cardiovascular: No lower extremity edema, non tender, no erythema  Skin: Warm dry intact with no signs of infection or rash on extremities or on axial skeleton.   Abdomen: Soft nontender  Neuro: Cranial nerves II through XII are intact, neurovascularly intact in all extremities with 2+ DTRs and 2+ pulses.  Lymph: No lymphadenopathy of posterior or anterior cervical chain or axillae bilaterally.  Gait antalgic gait MSK:  tender with full range of motion and good stability and symmetric strength and tone of  elbows, wrist, hip, and ankles bilaterally.  Knee: Left valgus deformity noted. Large thigh to calf ratio.  Tender to palpation over medial and PF joint line.  ROM full in flexion and extension and lower leg rotation. instability with valgus force.  painful patellar compression. Patellar glide with moderate crepitus. Patellar and quadriceps tendons unremarkable. Hamstring and quadriceps strength is normal. Contralateral knee shows replacement with mild swelling. \ Bilateral shoulder exam essentially atrophy of the shoulder girdle.  Crepitus with range of motion left greater than right.  Positive impingement.  Decreased range of motion actively in all planes.  After informed written and verbal consent, patient was seated on exam table. Right shoulder was prepped with alcohol swab and utilizing posterior approach, patient's right glenohumeral space was injected with 4:1  marcaine 0.5%: Kenalog 40mg /dL. Patient tolerated the procedure well without immediate complications.  After informed written and verbal consent, patient was seated on exam table. Left shoulder was prepped with alcohol swab and utilizing posterior approach, patient's right glenohumeral space was injected with 4:1  marcaine 0.5%: Kenalog 40mg /dL. Patient tolerated the procedure well without immediate complications.  Impression and Recommendations:     This case required medical decision making of moderate complexity. The above documentation has been reviewed and is accurate and complete Lyndal Pulley, DO       Note: This dictation was prepared with Dragon dictation  along with smaller phrase technology. Any transcriptional errors that result from this process are unintentional.

## 2018-07-29 ENCOUNTER — Encounter: Payer: Self-pay | Admitting: Family Medicine

## 2018-07-29 ENCOUNTER — Ambulatory Visit: Payer: Medicare HMO | Admitting: Family Medicine

## 2018-07-29 ENCOUNTER — Ambulatory Visit (INDEPENDENT_AMBULATORY_CARE_PROVIDER_SITE_OTHER): Payer: Medicare HMO | Admitting: General Practice

## 2018-07-29 DIAGNOSIS — Z7901 Long term (current) use of anticoagulants: Secondary | ICD-10-CM

## 2018-07-29 DIAGNOSIS — M75101 Unspecified rotator cuff tear or rupture of right shoulder, not specified as traumatic: Secondary | ICD-10-CM | POA: Diagnosis not present

## 2018-07-29 DIAGNOSIS — M12811 Other specific arthropathies, not elsewhere classified, right shoulder: Secondary | ICD-10-CM

## 2018-07-29 DIAGNOSIS — M12812 Other specific arthropathies, not elsewhere classified, left shoulder: Secondary | ICD-10-CM

## 2018-07-29 LAB — POCT INR: INR: 2.5 (ref 2.0–3.0)

## 2018-07-29 NOTE — Patient Instructions (Addendum)
Pre visit review using our clinic review tool, if applicable. No additional management support is needed unless otherwise documented below in the visit note.  Continue to take 1 tablet daily except 1/2 tablet on Monday and Fridays and re-check in 4 weeks.

## 2018-07-29 NOTE — Assessment & Plan Note (Signed)
Repeat injection given.  Had bilateral.  Patient has responded fairly well to the injections.  Wants to avoid any surgical intervention.  Patient will monitor blood sugars.  Continue to work out follow-up again in 10 weeks

## 2018-07-29 NOTE — Patient Instructions (Signed)
Good to see you  Ice is your friend Injected the shoulders again  pennsaid pinkie amount topically 2 times daily as needed.   See me again in 10 weeks!

## 2018-07-29 NOTE — Assessment & Plan Note (Signed)
Bilateral.  Injections were given.  Tolerated the procedure well.  Discussed icing regimen, topical anti-inflammatories, proper lifting mechanics.  Patient is working out on a regular basis and continuing to try to lose weight.  Follow-up again in 4 weeks

## 2018-08-02 ENCOUNTER — Other Ambulatory Visit (HOSPITAL_COMMUNITY): Payer: Self-pay | Admitting: Cardiology

## 2018-08-04 ENCOUNTER — Other Ambulatory Visit (HOSPITAL_COMMUNITY): Payer: Self-pay | Admitting: Cardiology

## 2018-08-05 ENCOUNTER — Other Ambulatory Visit: Payer: Self-pay | Admitting: Internal Medicine

## 2018-08-05 DIAGNOSIS — I255 Ischemic cardiomyopathy: Secondary | ICD-10-CM

## 2018-08-05 DIAGNOSIS — I428 Other cardiomyopathies: Secondary | ICD-10-CM

## 2018-08-05 DIAGNOSIS — I4819 Other persistent atrial fibrillation: Secondary | ICD-10-CM

## 2018-08-05 NOTE — Telephone Encounter (Signed)
   LOV:03-27-18 NextOV:09-22-18 Last Filled/Quantity:07/11/18 60#

## 2018-08-07 ENCOUNTER — Other Ambulatory Visit: Payer: Self-pay | Admitting: Internal Medicine

## 2018-08-07 ENCOUNTER — Telehealth (HOSPITAL_COMMUNITY): Payer: Self-pay | Admitting: Pharmacist

## 2018-08-07 DIAGNOSIS — I428 Other cardiomyopathies: Secondary | ICD-10-CM

## 2018-08-07 DIAGNOSIS — I4819 Other persistent atrial fibrillation: Secondary | ICD-10-CM

## 2018-08-07 DIAGNOSIS — I255 Ischemic cardiomyopathy: Secondary | ICD-10-CM

## 2018-08-07 NOTE — Telephone Encounter (Signed)
Spoke with Mr. Arizpe and offered to mail a [employer] patient assistance application to see if he qualifies for assistance with his [name].   Ruta Hinds. Velva Harman, PharmD, BCPS, CPP Clinical Pharmacist Phone: [phone] 08/07/2018 2:48 PM

## 2018-08-07 NOTE — Telephone Encounter (Signed)
-----   Message from [name], [place] sent at 08/06/2018  4:18 PM EDT ----- [name], pt called and stated his copay for his [name] is $45 and pt cannot afford that. He states that he is in the pt assistance program. Can you check on this?

## 2018-08-08 NOTE — Telephone Encounter (Signed)
Noted  

## 2018-08-08 NOTE — Telephone Encounter (Signed)
Check [place] registry last filled 07/11/2018. MD is out of the office for next 2 weeks pls advise.Marland KitchenJohny Chess

## 2018-08-08 NOTE — Telephone Encounter (Signed)
Dr. Quay Burow approved and sent electronically to pof.Marland KitchenJohny Chess

## 2018-08-11 ENCOUNTER — Other Ambulatory Visit: Payer: Self-pay | Admitting: Internal Medicine

## 2018-08-12 ENCOUNTER — Other Ambulatory Visit: Payer: Self-pay | Admitting: Internal Medicine

## 2018-08-12 DIAGNOSIS — G4733 Obstructive sleep apnea (adult) (pediatric): Secondary | ICD-10-CM | POA: Diagnosis not present

## 2018-08-13 ENCOUNTER — Telehealth: Payer: Self-pay

## 2018-08-13 NOTE — Telephone Encounter (Signed)
Patient returned call and provided ICM intro.  Patient agreed to monthly follow up.  He sleeps in recliner but has unplugged monitor.  Advised to plug in monitor beside where he sleeps and explained Alerts will be sent for urgent rhythm changes. He will plug monitor in after call.  He does not adhere to strict low salt diet and admitted to salting apples for snacks.  Discussed low salt diet. 1st ICM remote transmsision scheduled for 08/28/2018.  Encouraged to call if he develops any fluid symptoms and discussed symptoms to report.

## 2018-08-13 NOTE — Telephone Encounter (Signed)
Referred to ICM clinic by Amber Seiler, NP.   Attempted call to patient for ICM intro and left message for return call.    

## 2018-08-15 NOTE — Progress Notes (Signed)
[facility] Report   Patient Details  Name: [name] MRN: [id number] Date of Birth: [birth year] Age: 62 y.o. PCP: [name], MD  Vitals:   08/15/18 1138  BP: 100/70  Pulse: 73  Resp: 18  SpO2: 95%  Weight: 245 lb (111.1 kg)     [facility] Eval - 08/15/18 1100      Measurement   Neck measurement  17.5 Inches    Waist Circumference  50.5 inches      Information for Trainer   Goals  Mr. Liberto had a goal of 10lb weight loss.  he lost 17lbs.    Current Exercise  3x's/wk & plays drums    Orthopedic Concerns  "knees feel ok when wears braces.  Shots to shoulders have helped"    Restrictions/Precautions  --   no longer feels like he's a fall risk     Timed Up and Go (TUGS)   Timed Up and Go  Moderate risk 10-12 seconds   9.86     Mobility and Daily Activities   I find it easy to walk up or down two or more flights of stairs.  3    I have no trouble taking out the trash.  4    I do housework such as vacuuming and dusting on my own without difficulty.  4    I can easily lift a gallon of milk (8lbs).  4    I can easily walk a mile.  3    I have no trouble reaching into high cupboards or reaching down to pick up something from the floor.  4    I do not have trouble doing out-door work such as Armed forces logistics/support/administrative officer, raking leaves, or gardening.  4      Mobility and Daily Activities   I feel younger than my age.  3    I feel independent.  3    I feel energetic.  4    I live an active life.   4    I feel strong.  4    I feel healthy.  3    I feel active as other people my age.  4      How fit and strong are you.   Fit and Strong Total Score  51      Past Medical History:  Diagnosis Date  . AICD (automatic cardioverter/defibrillator) present   . Anemia    supposed to be taking Vit B but doesn't  . ANXIETY    takes Xanax nightly  . Arthritis   . Asthma    Albuterol prn and Advair daily;also takes Prednisone daily  . Atrial fibrillation ([place])  09/06/2015  . Cardiomyopathy [facility])    a. EF 25% TEE July 2013; b. EF normalized 2015;  c. 03/2015 Echo: EF 40-45%, difrf HK, PASP 38 mmHg, Mild MR, sev LAE/RAE.  [name] Chronic constipation    takes OTC stool softener  . COPD (chronic obstructive pulmonary disease) ([place])    "one dr says COPD; one dr says emphysema" (09/18/2017)  . DEPRESSION    takes Zoloft and Doxepin daily  . Diverticulitis   . DYSKINESIA, ESOPHAGUS   . Emphysema of lung [facility])    "one dr says COPD; one dr says emphysema" (09/18/2017)  . Essential hypertension       . FIBROMYALGIA   . GERD (gastroesophageal reflux disease)       . Glaucoma   . HYPERLIPIDEMIA    a.  Intolerant to statins.  . INSOMNIA    takes Ambien nightly  . Myocardial infarction [facility])    a. 2012 Myoview notable for prior infarct;  b. 03/2015 Lexiscan CL: EF 37%, diff HK, small area of inferior infarct from apex to base-->Med Rx.  . Myocardial infarction ([place])   . O2 dependent    "2.5L q hs & prn" (09/18/2017)  . Paroxysmal atrial fibrillation (HCC)    a. CHA2DS2VASc = 3--> takes Coumadin;  b. 03/15/2015 Successful TEE/DCCV;  c. 03/2015 recurrent afib, Amio d/c'd in setting of hyperthyroidism.  . Peripheral neuropathy   . Pneumonia 12/2016  . Rash and other nonspecific skin eruption 04/12/2009   no cause found saw dermatologists x 2 and allergist  . SLEEP APNEA, OBSTRUCTIVE    a. doesn't use CPAP  . Syncope    a. 03/2015 s/p MDT LINQ.  [name] Type II diabetes mellitus ([place])        Past Surgical History:  Procedure Laterality Date  . ACNE CYST REMOVAL     2 on back   . AV NODE ABLATION N/A 10/25/2017   Procedure: AV NODE ABLATION;  Surgeon: Deboraha Sprang, MD;  Location: [place] CV LAB;  Service: Cardiovascular;  Laterality: N/A;  . BIV ICD INSERTION CRT-D N/A 09/18/2017   Procedure: BIV ICD INSERTION CRT-D;  Surgeon: Deboraha Sprang, MD;  Location: [place] CV LAB;  Service: Cardiovascular;  Laterality: N/A;  . CARDIAC CATHETERIZATION N/A  03/21/2016   Procedure: Right/Left Heart Cath and Coronary Angiography;  Surgeon: Larey Dresser, MD;  Location: [place] CV LAB;  Service: Cardiovascular;  Laterality: N/A;  . CARDIOVERSION  04/18/2012   Procedure: CARDIOVERSION;  Surgeon: Fay Records, MD;  Location: [place];  Service: Cardiovascular;  Laterality: N/A;  . CARDIOVERSION  04/25/2012   Procedure: CARDIOVERSION;  Surgeon: Thayer Headings, MD;  Location: [place];  Service: Cardiovascular;  Laterality: N/A;  . CARDIOVERSION  04/25/2012   Procedure: CARDIOVERSION;  Surgeon: Fay Records, MD;  Location: [place];  Service: Cardiovascular;  Laterality: N/A;  . CARDIOVERSION  05/09/2012   Procedure: CARDIOVERSION;  Surgeon: Sherren Mocha, MD;  Location: [place];  Service: Cardiovascular;  Laterality: N/A;  changed from crenshaw to cooper by trish/leone-endo  . CARDIOVERSION N/A 03/15/2015   Procedure: CARDIOVERSION;  Surgeon: Thayer Headings, MD;  Location: [facility] ENDOSCOPY;  Service: Cardiovascular;  Laterality: N/A;  . COLONOSCOPY    . COLONOSCOPY WITH PROPOFOL N/A 10/21/2014   Procedure: COLONOSCOPY WITH PROPOFOL;  Surgeon: Ladene Artist, MD;  [facility] ENDOSCOPY;  Service: Endoscopy;  Laterality: N/A;  . EP IMPLANTABLE DEVICE N/A 04/06/2015   Procedure: Loop Recorder Insertion;  Surgeon: Evans Lance, MD;  Location: [place] CV LAB;  Service: Cardiovascular;  Laterality: N/A;  . ESOPHAGOGASTRODUODENOSCOPY    . JOINT REPLACEMENT    . LOOP RECORDER REMOVAL N/A 09/18/2017   Procedure: LOOP RECORDER REMOVAL;  Surgeon: Deboraha Sprang, MD;  Location: [place] CV LAB;  Service: Cardiovascular;  Laterality: N/A;  . RIGHT/LEFT HEART CATH AND CORONARY ANGIOGRAPHY N/A 01/28/2017   Procedure: Right/Left Heart Cath and Coronary Angiography;  Surgeon: Larey Dresser, MD;  Location: [place] CV LAB;  Service: Cardiovascular;  Laterality: N/A;  . TEE WITHOUT CARDIOVERSION  04/25/2012   Procedure: TRANSESOPHAGEAL ECHOCARDIOGRAM (TEE);   Surgeon: Thayer Headings, MD;  Location: [place];  Service: Cardiovascular;  Laterality: N/A;  . TEE WITHOUT CARDIOVERSION N/A 03/15/2015   Procedure: TRANSESOPHAGEAL ECHOCARDIOGRAM (TEE);  Surgeon: Thayer Headings, MD;  Location: [place];  Service: Cardiovascular;  Laterality: N/A;  . TONSILLECTOMY AND ADENOIDECTOMY    . TOTAL KNEE ARTHROPLASTY Right 06/15/2014   Procedure: TOTAL KNEE ARTHROPLASTY;  Surgeon: Renette Butters, MD;  Location: [place];  Service: Orthopedics;  Laterality: Right;   Social History   Tobacco Use  Smoking Status Former Smoker  . Packs/day: 2.00  . Years: 30.00  . Pack years: 60.00  . Types: Cigarettes  . Last attempt to quit: 10/16/2007  . Years since quitting: 10.8  Smokeless Tobacco Never Used   Mr. Piedra has completed the 12 week PREP and has had great outcomes.  He has lost 17 lbs and exercises 3 times a week along with drumming frequently throughout the week.  He seems committed to his healthier lifestyle, not only with exercise, but with his diet.  Mr. Mccauslin has become a "big brother" to the new class and it's participants.         PRE-program    POST-program  2 min. marching   69    95  30 sec. chair stand   10    13  30  sec. arm curl    13    18       Vanita Ingles 08/15/2018, 11:46 AM

## 2018-08-20 ENCOUNTER — Other Ambulatory Visit: Payer: Self-pay | Admitting: Family Medicine

## 2018-08-20 NOTE — Telephone Encounter (Signed)
Refill done.  

## 2018-08-24 DIAGNOSIS — J449 Chronic obstructive pulmonary disease, unspecified: Secondary | ICD-10-CM | POA: Diagnosis not present

## 2018-08-26 ENCOUNTER — Ambulatory Visit (INDEPENDENT_AMBULATORY_CARE_PROVIDER_SITE_OTHER): Payer: Medicare HMO | Admitting: General Practice

## 2018-08-26 DIAGNOSIS — Z7901 Long term (current) use of anticoagulants: Secondary | ICD-10-CM | POA: Diagnosis not present

## 2018-08-26 LAB — POCT INR: INR: 1.3 — AB (ref 2.0–3.0)

## 2018-08-26 NOTE — Patient Instructions (Addendum)
Pre visit review using our clinic review tool, if applicable. No additional management support is needed unless otherwise documented below in the visit note.  Take 1 1/2 tablets today, tomorrow and Thursday (11/12, 11/13 and 11/14) and then continue to take 1 tablet daily except 1/2 tablet on Monday and Fridays and re-check in 4 weeks.

## 2018-08-28 ENCOUNTER — Ambulatory Visit (INDEPENDENT_AMBULATORY_CARE_PROVIDER_SITE_OTHER): Payer: Medicare HMO

## 2018-08-28 DIAGNOSIS — I5022 Chronic systolic (congestive) heart failure: Secondary | ICD-10-CM | POA: Diagnosis not present

## 2018-08-28 DIAGNOSIS — Z95810 Presence of automatic (implantable) cardiac defibrillator: Secondary | ICD-10-CM

## 2018-08-29 ENCOUNTER — Telehealth: Payer: Self-pay

## 2018-08-29 ENCOUNTER — Other Ambulatory Visit: Payer: Self-pay | Admitting: Allergy and Immunology

## 2018-08-29 DIAGNOSIS — J3089 Other allergic rhinitis: Secondary | ICD-10-CM

## 2018-08-29 DIAGNOSIS — Z8709 Personal history of other diseases of the respiratory system: Secondary | ICD-10-CM

## 2018-08-29 NOTE — Telephone Encounter (Signed)
Courtesy Refill

## 2018-08-29 NOTE — Telephone Encounter (Signed)
Remote ICM transmission received.  Attempted call to patient regarding ICM remote transmission and left detailed message, per DPR, with next ICM remote transmission date of 09/29/2018.  Advised to return call for any fluid symptoms or questions.   

## 2018-08-29 NOTE — Progress Notes (Signed)
EPIC Encounter for ICM Monitoring  Patient Name: [name] is a 62 y.o. male Date: 08/29/2018 Primary Care Physican: [name], MD Primary Cardiologist: [name] Electrophysiologist: Vergie Living Pacing:  100%  Today's Weight:  unknown       1st ICM remote. Attempted call to patient and unable to reach.  Left detailed message, per DPR, regarding transmission.  Transmission reviewed.    Thoracic impedance normal.   Prescribed: Torsemide 20 mg take 2 tablets (40 mg total) in the morning and 1 tablet (20 mg total) in the evening.   Labs: 07/14/2018 Creatinine 1.32, BUN 21, Potassium 4.6, Sodium 136, eGFR 56->60 03/27/2018 Creatinine 1.09, BUN 14, Potassium 3.7, Sodium 138, eGFR >60  03/31/2018 Creatinine 1.01, BUN 19, Potassium 4.4, Sodium 138, eGFR >60  03/27/2018 Creatinine 0.94, BUN 16, Potassium 5.0, Sodium 141  03/18/2018 Creatinine 0.94, BUN 26, Potassium 4.6, Sodium 140  A complete set of results can be found in Results Review.  Recommendations: Left voice mail with ICM number and encouraged to call if experiencing any fluid symptoms.  Follow-up plan: ICM clinic phone appointment on 09/29/2018.    Copy of ICM check sent to Dr. [name].   3 month ICM trend: 08/28/2018    1 Year ICM trend:       [name], RN 08/29/2018 9:14 AM

## 2018-09-03 ENCOUNTER — Ambulatory Visit (HOSPITAL_COMMUNITY)
Admission: RE | Admit: 2018-09-03 | Discharge: 2018-09-03 | Disposition: A | Discharge status: Home / Self Care | Payer: Medicare HMO | Source: Ambulatory Visit | Attending: Internal Medicine | Admitting: Internal Medicine

## 2018-09-03 ENCOUNTER — Other Ambulatory Visit: Payer: Self-pay | Admitting: Internal Medicine

## 2018-09-03 DIAGNOSIS — I428 Other cardiomyopathies: Secondary | ICD-10-CM | POA: Insufficient documentation

## 2018-09-03 DIAGNOSIS — I4819 Other persistent atrial fibrillation: Secondary | ICD-10-CM

## 2018-09-03 DIAGNOSIS — I255 Ischemic cardiomyopathy: Secondary | ICD-10-CM

## 2018-09-03 LAB — HEPATIC FUNCTION PANEL
ALT: 25 U/L (ref 0–44)
AST: 26 U/L (ref 15–41)
Albumin: 3.8 g/dL (ref 3.5–5.0)
Alkaline Phosphatase: 46 U/L (ref 38–126)
BILIRUBIN TOTAL: 0.6 mg/dL (ref 0.3–1.2)
Total Protein: 6.1 g/dL — ABNORMAL LOW (ref 6.5–8.1)

## 2018-09-03 LAB — LIPID PANEL
Cholesterol: 176 mg/dL (ref 0–200)
HDL: 36 mg/dL — AB (ref >40)
LDL CALC: 115 mg/dL — AB (ref 0–99)
TRIGLYCERIDES: 124 mg/dL (ref <150)
Total CHOL/HDL Ratio: 4.9 RATIO
VLDL: 25 mg/dL (ref 0–40)

## 2018-09-03 NOTE — Telephone Encounter (Signed)
[place] Controlled Substance Database checked. Last filled on 08/08/18. RX filled that Dr Quay Burow sent in.

## 2018-09-04 ENCOUNTER — Other Ambulatory Visit (HOSPITAL_COMMUNITY): Payer: Self-pay | Admitting: Pharmacist

## 2018-09-04 MED ORDER — SACUBITRIL-VALSARTAN 24-26 MG PO TABS: 1.0000 | ORAL_TABLET | Freq: Two times a day (BID) | ORAL | 3 refills | Status: DC

## 2018-09-04 NOTE — Telephone Encounter (Signed)
Done erx 

## 2018-09-09 ENCOUNTER — Telehealth (HOSPITAL_COMMUNITY): Payer: Self-pay | Admitting: Pharmacist

## 2018-09-09 ENCOUNTER — Other Ambulatory Visit (HOSPITAL_COMMUNITY): Payer: Self-pay | Admitting: Surgery

## 2018-09-09 ENCOUNTER — Ambulatory Visit: Payer: Medicare HMO | Admitting: Podiatry

## 2018-09-09 DIAGNOSIS — E7889 Other lipoprotein metabolism disorders: Secondary | ICD-10-CM

## 2018-09-09 NOTE — Telephone Encounter (Signed)
[employer] patient assistance approved for [employer] 24-26 mg BID through 10/14/18. Patient aware and grateful for the assistance.   Ruta Hinds. Velva Harman, PharmD, BCPS, CPP Clinical Pharmacist Phone: [phone] 09/09/2018 10:14 AM

## 2018-09-12 DIAGNOSIS — G4733 Obstructive sleep apnea (adult) (pediatric): Secondary | ICD-10-CM | POA: Diagnosis not present

## 2018-09-18 ENCOUNTER — Other Ambulatory Visit: Payer: Self-pay

## 2018-09-18 ENCOUNTER — Ambulatory Visit (INDEPENDENT_AMBULATORY_CARE_PROVIDER_SITE_OTHER): Payer: Medicare HMO

## 2018-09-18 DIAGNOSIS — I428 Other cardiomyopathies: Secondary | ICD-10-CM | POA: Diagnosis not present

## 2018-09-18 MED ORDER — TRAZODONE HCL 50 MG PO TABS: 50.0000 mg | ORAL_TABLET | Freq: Every evening | ORAL | 1 refills | Status: DC | PRN

## 2018-09-18 NOTE — Progress Notes (Signed)
Remote ICD transmission.   

## 2018-09-19 ENCOUNTER — Other Ambulatory Visit: Payer: Self-pay | Admitting: Physician Assistant

## 2018-09-19 NOTE — Telephone Encounter (Signed)
Patient Instructions by Christinia Gully, RN at 07/04/2018 2:59 PM  Author: Christinia Gully, RN Author Type: Registered Nurse Filed: 07/04/2018 3:07 PM  Note Status: Addendum Cosign: Cosign Not Required Date of Service: 07/04/2018 2:59 PM  Editor: Christinia Gully, RN (Registered Nurse)  Prior Versions: 1. Christinia Gully, RN (Registered Nurse) at 07/04/2018 3:01 PM - Signed    1. Start Crestor 5 mg daily. 2. STOP Losartan. 3. Start Entresto 24-26 twice a day. 4. Once you start your Entresto please decrease Torsemide to 40 mg in the morning and 20 mg in the evening. 5. Return to clinic in 10 days for labs. (BMET, Digoxin) 6. Return to clinic in 2 months for labs. (lipids, LFTs) 7. Return to clinic for a visit with Dr. [name] in 3 months.

## 2018-09-20 ENCOUNTER — Other Ambulatory Visit: Payer: Self-pay | Admitting: Internal Medicine

## 2018-09-20 DIAGNOSIS — I255 Ischemic cardiomyopathy: Secondary | ICD-10-CM

## 2018-09-20 DIAGNOSIS — I4819 Other persistent atrial fibrillation: Secondary | ICD-10-CM

## 2018-09-20 DIAGNOSIS — I428 Other cardiomyopathies: Secondary | ICD-10-CM

## 2018-09-21 ENCOUNTER — Other Ambulatory Visit: Payer: Self-pay | Admitting: Internal Medicine

## 2018-09-22 ENCOUNTER — Ambulatory Visit: Payer: Self-pay

## 2018-09-22 ENCOUNTER — Ambulatory Visit (INDEPENDENT_AMBULATORY_CARE_PROVIDER_SITE_OTHER): Payer: Medicare HMO | Admitting: Internal Medicine

## 2018-09-22 ENCOUNTER — Encounter: Payer: Self-pay | Admitting: Family Medicine

## 2018-09-22 ENCOUNTER — Ambulatory Visit: Payer: Medicare HMO | Admitting: Family Medicine

## 2018-09-22 ENCOUNTER — Encounter: Payer: Self-pay | Admitting: Internal Medicine

## 2018-09-22 VITALS — BP 132/88 | HR 78 | Temp 98.3°F | Ht 70.0 in | Wt 240.0 lb

## 2018-09-22 DIAGNOSIS — G8929 Other chronic pain: Secondary | ICD-10-CM | POA: Diagnosis not present

## 2018-09-22 DIAGNOSIS — L308 Other specified dermatitis: Secondary | ICD-10-CM

## 2018-09-22 DIAGNOSIS — M12812 Other specific arthropathies, not elsewhere classified, left shoulder: Secondary | ICD-10-CM | POA: Diagnosis not present

## 2018-09-22 DIAGNOSIS — Z0000 Encounter for general adult medical examination without abnormal findings: Secondary | ICD-10-CM | POA: Diagnosis not present

## 2018-09-22 DIAGNOSIS — M1712 Unilateral primary osteoarthritis, left knee: Secondary | ICD-10-CM

## 2018-09-22 DIAGNOSIS — M12811 Other specific arthropathies, not elsewhere classified, right shoulder: Secondary | ICD-10-CM | POA: Diagnosis not present

## 2018-09-22 DIAGNOSIS — I10 Essential (primary) hypertension: Secondary | ICD-10-CM | POA: Diagnosis not present

## 2018-09-22 DIAGNOSIS — E119 Type 2 diabetes mellitus without complications: Secondary | ICD-10-CM | POA: Diagnosis not present

## 2018-09-22 DIAGNOSIS — M75101 Unspecified rotator cuff tear or rupture of right shoulder, not specified as traumatic: Secondary | ICD-10-CM

## 2018-09-22 DIAGNOSIS — M25512 Pain in left shoulder: Principal | ICD-10-CM

## 2018-09-22 LAB — POCT GLYCOSYLATED HEMOGLOBIN (HGB A1C): HEMOGLOBIN A1C: 4.9 % (ref 4.0–5.6)

## 2018-09-22 MED ORDER — PREDNISONE 10 MG PO TABS: ORAL_TABLET | ORAL | 0 refills | Status: DC

## 2018-09-22 MED ORDER — PREDNISONE 10 MG PO TABS: 10.0000 mg | ORAL_TABLET | Freq: Every day | ORAL | 0 refills | Status: DC

## 2018-09-22 NOTE — Telephone Encounter (Signed)
Done erx 

## 2018-09-22 NOTE — Patient Instructions (Signed)
Please take all new medication as prescribed - the prednisone  Your A1c was OK today  Please continue all other medications as before, and refills have been done if requested.  Please have the pharmacy call with any other refills you may need.  Please continue your efforts at being more active, low cholesterol diet, and weight control.  You are otherwise up to date with prevention measures today.  Please keep your appointments with your specialists as you may have planned  Please return in 6 months, or sooner if needed, with Lab testing done 3-5 days before

## 2018-09-22 NOTE — Progress Notes (Signed)
Subjective:    Patient ID: [name], male    DOB: 04/02/1956, 62 y.o.   MRN: [id number]  HPI  Here to f/u; overall doing ok,  Pt denies chest pain, increasing sob or doe, wheezing, orthopnea, PND, increased LE swelling, palpitations, dizziness or syncope.  Pt denies new neurological symptoms such as new headache, or facial or extremity weakness or numbness.  Pt denies polydipsia, polyuria, or low sugar episode.  Pt states overall good compliance with meds, mostly trying to follow appropriate diet, with wt overall stable,  but little exercise however  Seen per card for afib doing well.  Losing weight intentionally with vastly improved diet, has really taken ownership of this. Peak wt has been 343, now Wt Readings from Last 3 Encounters:  09/22/18 240 lb (108.9 kg)  08/15/18 245 lb (111.1 kg)  07/29/18 247 lb (112 kg)  Overall feels much better.  S/p PPM x 1 yr, now working well, even now walks without cane.  Can do 1 mild on treadmill and bike and light wts.  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  Now on entrestos, has not been able to tolerate statins and has been referred to lipid clinic for repatha.  Has ongoing left knee pain, trying to put replacement for now, followed by Dr Tamala Julian.   Not currently taking any OHA for sugar.  Mother died sept 2018-01-23 with smoking x 50 yrs and pneumonia/copd. He has had some grief but Denies worsening depressive symptoms, suicidal ideation, or panic.  Also excema to back has flared despite the MTX per derm and asks for short course prednisone, has derm f/u next month.  Also incidentally has a 2 cm clean laceration to the left lower leg above the ankle after accidental puppy scratch, but no worsening redness, swelling, drainage or pain Past Medical History:  Diagnosis Date  . AICD (automatic cardioverter/defibrillator) present   . Anemia    supposed to be taking Vit B but doesn't  . ANXIETY    takes Xanax nightly  . Arthritis   . Asthma    Albuterol prn and Advair daily;also takes Prednisone daily  . Atrial fibrillation ([place]) 09/06/2015  . Cardiomyopathy [facility])    a. EF 25% TEE July 2013; b. EF normalized Jan 23, 2014;  c. 03/2015 Echo: EF 40-45%, difrf HK, PASP 38 mmHg, Mild MR, sev LAE/RAE.  [name] Chronic constipation    takes OTC stool softener  . COPD (chronic obstructive pulmonary disease) ([place])    "one dr says COPD; one dr says emphysema" (09/18/2017)  . DEPRESSION    takes Zoloft and Doxepin daily  . Diverticulitis   . DYSKINESIA, ESOPHAGUS   . Emphysema of lung [facility])    "one dr says COPD; one dr says emphysema" (09/18/2017)  . Essential hypertension       . FIBROMYALGIA   . GERD (gastroesophageal reflux disease)       . Glaucoma   . HYPERLIPIDEMIA    a. Intolerant to statins.  . INSOMNIA    takes Ambien nightly  . Myocardial infarction [facility])    a. 01-24-2011 Myoview notable for prior infarct;  b. 03/2015 Lexiscan CL: EF 37%, diff HK, small area of inferior infarct from apex to base-->Med Rx.  . Myocardial infarction ([place])   . O2 dependent    "2.5L q hs & prn" (09/18/2017)  . Paroxysmal atrial fibrillation ([facility])    a. CHA2DS2VASc = 3--> takes Coumadin;  b. 03/15/2015 Successful TEE/DCCV;  c. 03/2015 recurrent afib,  Amio d/c'd in setting of hyperthyroidism.  . Peripheral neuropathy   . Pneumonia 12/2016  . Rash and other nonspecific skin eruption 04/12/2009   no cause found saw dermatologists x 2 and allergist  . SLEEP APNEA, OBSTRUCTIVE    a. doesn't use CPAP  . Syncope    a. 03/2015 s/p MDT LINQ.  [name] Type II diabetes mellitus ([place])        Past Surgical History:  Procedure Laterality Date  . ACNE CYST REMOVAL     2 on back   . AV NODE ABLATION N/A 10/25/2017   Procedure: AV NODE ABLATION;  Surgeon: Deboraha Sprang, MD;  Location: [place] CV LAB;  Service: Cardiovascular;  Laterality: N/A;  . BIV ICD INSERTION CRT-D N/A 09/18/2017   Procedure: BIV ICD INSERTION CRT-D;  Surgeon: Deboraha Sprang, MD;  Location: [place] CV  LAB;  Service: Cardiovascular;  Laterality: N/A;  . CARDIAC CATHETERIZATION N/A 03/21/2016   Procedure: Right/Left Heart Cath and Coronary Angiography;  Surgeon: Larey Dresser, MD;  Location: [place] CV LAB;  Service: Cardiovascular;  Laterality: N/A;  . CARDIOVERSION  04/18/2012   Procedure: CARDIOVERSION;  Surgeon: Fay Records, MD;  Location: [place];  Service: Cardiovascular;  Laterality: N/A;  . CARDIOVERSION  04/25/2012   Procedure: CARDIOVERSION;  Surgeon: Thayer Headings, MD;  Location: [place];  Service: Cardiovascular;  Laterality: N/A;  . CARDIOVERSION  04/25/2012   Procedure: CARDIOVERSION;  Surgeon: Fay Records, MD;  Location: [place];  Service: Cardiovascular;  Laterality: N/A;  . CARDIOVERSION  05/09/2012   Procedure: CARDIOVERSION;  Surgeon: Sherren Mocha, MD;  Location: [place];  Service: Cardiovascular;  Laterality: N/A;  changed from crenshaw to cooper by trish/leone-endo  . CARDIOVERSION N/A 03/15/2015   Procedure: CARDIOVERSION;  Surgeon: Thayer Headings, MD;  Location: [facility] ENDOSCOPY;  Service: Cardiovascular;  Laterality: N/A;  . COLONOSCOPY    . COLONOSCOPY WITH PROPOFOL N/A 10/21/2014   Procedure: COLONOSCOPY WITH PROPOFOL;  Surgeon: Ladene Artist, MD;  Location: [facility] ENDOSCOPY;  Service: Endoscopy;  Laterality: N/A;  . EP IMPLANTABLE DEVICE N/A 04/06/2015   Procedure: Loop Recorder Insertion;  Surgeon: Evans Lance, MD;  Location: [place] CV LAB;  Service: Cardiovascular;  Laterality: N/A;  . ESOPHAGOGASTRODUODENOSCOPY    . JOINT REPLACEMENT    . LOOP RECORDER REMOVAL N/A 09/18/2017   Procedure: LOOP RECORDER REMOVAL;  Surgeon: Deboraha Sprang, MD;  Location: [place] CV LAB;  Service: Cardiovascular;  Laterality: N/A;  . RIGHT/LEFT HEART CATH AND CORONARY ANGIOGRAPHY N/A 01/28/2017   Procedure: Right/Left Heart Cath and Coronary Angiography;  Surgeon: Larey Dresser, MD;  Location: [place] CV LAB;  Service: Cardiovascular;  Laterality: N/A;  . TEE WITHOUT  CARDIOVERSION  04/25/2012   Procedure: TRANSESOPHAGEAL ECHOCARDIOGRAM (TEE);  Surgeon: Thayer Headings, MD;  Location: [place];  Service: Cardiovascular;  Laterality: N/A;  . TEE WITHOUT CARDIOVERSION N/A 03/15/2015   Procedure: TRANSESOPHAGEAL ECHOCARDIOGRAM (TEE);  Surgeon: Thayer Headings, MD;  Location: [place];  Service: Cardiovascular;  Laterality: N/A;  . TONSILLECTOMY AND ADENOIDECTOMY    . TOTAL KNEE ARTHROPLASTY Right 06/15/2014   Procedure: TOTAL KNEE ARTHROPLASTY;  Surgeon: Renette Butters, MD;  Location: [place];  Service: Orthopedics;  Laterality: Right;    reports that he quit smoking about 10 years ago. His smoking use included cigarettes. He has a 60.00 pack-year smoking history. He has never used smokeless tobacco. He reports that he does not drink alcohol or  use drugs. family history includes Allergies in his mother; Asthma in his maternal grandmother and mother; COPD in his mother; Colon polyps in his mother; Hypothyroidism in his mother. Allergies  Allergen Reactions  . Amiodarone Other (See Comments)    hyperthyroidism  . Statins Other (See Comments)    myalgia  . Tape Other (See Comments)    Skin Tears Use Paper Tape Only   Current Outpatient Medications on File Prior to Visit  Medication Sig Dispense Refill  . acetaminophen (TYLENOL) 500 MG tablet Take 500 mg by mouth every 6 (six) hours as needed.    [name] albuterol (VENTOLIN HFA) 108 (90 Base) MCG/ACT inhaler INHALE 2 PUFFS 4 TIMES A DAY as needed (Patient taking differently: Inhale 2 puffs into the lungs every 6 (six) hours as needed for wheezing or shortness of breath. INHALE 2 PUFFS 4 TIMES A DAY as needed) 54 Inhaler 3  . ALPRAZolam (XANAX) 0.5 MG tablet TAKE 2 TABLETS BY MOUTH AT BEDTIME 60 tablet 2  . carisoprodol (SOMA) 350 MG tablet TAKE 1 TABLET BY MOUTH 3 TIMES A DAY AS NEEDED FOR MUSCLE SPASM 90 tablet 5  . carvedilol (COREG) 12.5 MG tablet TAKE 1 TABLET (12.5 MG TOTAL) BY MOUTH 2 (TWO) TIMES DAILY WITH  A MEAL. 180 tablet 3  . clobetasol ointment (TEMOVATE) 0.10 % Apply 1 application topically 2 (two) times daily. 60 g 1  . digoxin (LANOXIN) 0.25 MG tablet TAKE 0.5 TABLETS (0.125 MG TOTAL) BY MOUTH DAILY. 30 tablet 5  . doxepin (SINEQUAN) 10 MG capsule TAKE 2 CAPSULES BY MOUTH AT BEDTIME. 180 capsule 2  . ezetimibe (ZETIA) 10 MG tablet Take 1 tablet (10 mg total) by mouth daily. 90 tablet 3  . fluticasone (FLONASE) 50 MCG/ACT nasal spray PLACE 2 SPRAYS INTO BOTH NOSTRILS DAILY AS NEEDED FOR ALLERGIES OR RHINITIS. 16 g 0  . Fluticasone-Salmeterol (ADVAIR DISKUS) 250-50 MCG/DOSE AEPB Inhale 1 puff into the lungs 2 (two) times daily. (Patient taking differently: Inhale 1 puff into the lungs 2 (two) times daily as needed (shortness of breath). ) 180 each 3  . hydrocortisone 2.5 % cream APPLY TO AFFECTED AREA TWICE A DAY 28.35 g 5  . hydrOXYzine (ATARAX/VISTARIL) 10 MG tablet TAKE 1 TABLET BY MOUTH THREE TIMES A DAY AS NEEDED 60 tablet 2  . ketoconazole (NIZORAL) 2 % cream APPLY TO AFFECTED AREA EVERY DAY 15 g 0  . lidocaine (LIDODERM) 5 % APPLY 1 PATCH TO AFFECTED AREA FOR UP TO 12 HOURS . DO NOT USE MORE THAN ONE PATCH IN 24 HOURS 90 patch 1  . methotrexate (RHEUMATREX) 2.5 MG tablet TAKE 4 TABS EVERY WEEK (Patient taking differently: TAKE 6 TABS EVERY WEEK) 16 tablet 0  . omeprazole (PRILOSEC) 20 MG capsule Take 1 capsule (20 mg total) by mouth daily with supper. 90 capsule 1  . omeprazole (PRILOSEC) 20 MG capsule TAKE 1 CAPSULE BY MOUTH TWICE A DAY 180 capsule 2  . rosuvastatin (CRESTOR) 5 MG tablet Take 1 tablet (5 mg total) by mouth daily. 30 tablet 5  . sacubitril-valsartan (ENTRESTO) 24-26 MG Take 1 tablet by mouth 2 (two) times daily. 180 tablet 3  . sertraline (ZOLOFT) 100 MG tablet TAKE 2 TABLETS BY MOUTH AT BEDTIME 180 tablet 3  . silver sulfADIAZINE (SILVADENE) 1 % cream APPLY TO AFFECTED AREA EVERY DAY 50 g 3  . spironolactone (ALDACTONE) 25 MG tablet Take 1 tablet (25 mg total) by mouth  daily. 30 tablet 3  . torsemide (DEMADEX)  20 MG tablet Take 2 tablets (40 mg total) by mouth 2 (two) times daily. Take 2 tablets in the morning (40mg ) and 1 tablet (20 mg) in the evening. 120 tablet 3  . traMADol (ULTRAM) 50 MG tablet TAKE 1 TABLET BY MOUTH EVERY 6 HOURS AS NEEDED 30 tablet 2  . traZODone (DESYREL) 100 MG tablet TAKE 1/2 TABLET BY MOUTH EVERY DAY AT BEDTIME AS NEEDED 45 tablet 1  . traZODone (DESYREL) 50 MG tablet Take 1 tablet (50 mg total) by mouth at bedtime as needed for sleep. 90 tablet 1  . TUMS 500 MG chewable tablet Chew 500-2,000 mg by mouth every 4 (four) hours as needed for indigestion or heartburn.     . valACYclovir (VALTREX) 1000 MG tablet TAKE 1 TABLET BY MOUTH THREE TIMES A DAY 21 tablet 2  . venlafaxine XR (EFFEXOR-XR) 37.5 MG 24 hr capsule TAKE ONE CAPSULE BY MOUTH EVERY DAY WITH BREAKFAST 90 capsule 1  . warfarin (COUMADIN) 5 MG tablet Take as directed by anticoagulation clinic. 35 tablet 3  . zolpidem (AMBIEN) 10 MG tablet TAKE 1 TABLET BY MOUTH EVERYDAY AT BEDTIME 90 tablet 1   No current facility-administered medications on file prior to visit.    Review of Systems  Constitutional: Negative for other unusual diaphoresis or sweats HENT: Negative for ear discharge or swelling Eyes: Negative for other worsening visual disturbances Respiratory: Negative for stridor or other swelling  Gastrointestinal: Negative for worsening distension or other blood Genitourinary: Negative for retention or other urinary change Musculoskeletal: Negative for other MSK pain or swelling Skin: Negative for color change or other new lesions Neurological: Negative for worsening tremors and other numbness  Psychiatric/Behavioral: Negative for worsening agitation or other fatigue All other system neg per pt    Objective:   Physical Exam BP 132/88   Pulse 78   Temp 98.3 F (36.8 C) (Oral)   Ht 5\' 10"  (1.778 m)   Wt 240 lb (108.9 kg)   SpO2 98%   BMI 34.44 kg/m  VS noted,    Constitutional: Pt appears in NAD HENT: Head: NCAT.  Right Ear: External ear normal.  Left Ear: External ear normal.  Eyes: . Pupils are equal, round, and reactive to light. Conjunctivae and EOM are normal Nose: without d/c or deformity Neck: Neck supple. Gross normal ROM Cardiovascular: Normal rate and regular rhythm.   Pulmonary/Chest: Effort normal and breath sounds without rales or wheezing.  Abd:  Soft, NT, ND, + BS, no organomegaly Neurological: Pt is alert. At baseline orientation, motor grossly intact Skin: Skin is warm. + large excema rash to bilat upper thoracic back, no other new lesions, trace LLE edema, 2 cm clean epidermal laceration to left lower leg above the ankle without bleeding, red, tender or drainage Psychiatric: Pt behavior is normal without agitation , not depressed affect  No other exam findings  Lab Results  Component Value Date   WBC 7.7 03/27/2018   HGB 13.2 03/27/2018   HCT 38.7 (L) 03/27/2018   PLT 169.0 03/27/2018   GLUCOSE 91 07/14/2018   CHOL 176 09/03/2018   TRIG 124 09/03/2018   HDL 36 (L) 09/03/2018   LDLDIRECT 121 (H) 12/19/2017   LDLCALC 115 (H) 09/03/2018   ALT 25 09/03/2018   AST 26 09/03/2018   NA 136 07/14/2018   K 4.6 07/14/2018   CL 103 07/14/2018   CREATININE 1.32 (H) 07/14/2018   BUN 21 07/14/2018   CO2 23 07/14/2018   TSH 2.31 03/27/2018  PSA 1.81 03/27/2018   INR 1.3 (A) 08/26/2018   HGBA1C 5.9 03/27/2018   MICROALBUR 3.0 (H) 04/18/2017    POCT glycosylated hemoglobin (Hb A1C)  Order: [id number]  Status:  Final result Visible to patient:  No (Not Released) Dx:  Type 2 diabetes mellitus without comp...   Ref Range & Units 14:14 90mo ago 13mo ago 68yr ago  Hemoglobin A1C 4.0 - 5.6 % 4.9  5.9 R, CM 5.5 R, CM 5.8 R, CM           Assessment & Plan:

## 2018-09-22 NOTE — Patient Instructions (Signed)
Eat to see you  You know the drill  Ice is your friend Stay active Keep working on the weight.  Proud of you! See me again in 10 weeks just in case Happy holidays!

## 2018-09-22 NOTE — Assessment & Plan Note (Signed)
stable overall by history and exam, recent data reviewed with pt, and pt to continue medical treatment as before,  to f/u any worsening symptoms or concerns  

## 2018-09-22 NOTE — Assessment & Plan Note (Signed)
Stable.  No significant changes otherwise

## 2018-09-22 NOTE — Assessment & Plan Note (Signed)
Patient given injection and tolerated the procedure well.  Discussed icing regimen, topical anti-inflammatories, which activities to do which wants to avoid.  Follow-up again in 4 to 8 weeks

## 2018-09-22 NOTE — Progress Notes (Signed)
[name] [address], [place] [address] Phone: [phone] Subjective:    I [name] am serving as a Education administrator for Dr. Hulan Saas.   CC: right shoulder pain   [id number]  [name] is a 62 y.o. male coming in with complaint of right shoulder pain. Patient has had rotator cuff arthropathy.  Has responded well to injections.  Has been significant amount of time since his last injection.  Start and give more trouble.  Working out on a regular basis.  Has lost some weight at this moment.     Past Medical History:  Diagnosis Date  . AICD (automatic cardioverter/defibrillator) present   . Anemia    supposed to be taking Vit B but doesn't  . ANXIETY    takes Xanax nightly  . Arthritis   . Asthma    Albuterol prn and Advair daily;also takes Prednisone daily  . Atrial fibrillation ([place]) 09/06/2015  . Cardiomyopathy [facility])    a. EF 25% TEE July 2013; b. EF normalized 2015;  c. 03/2015 Echo: EF 40-45%, difrf HK, PASP 38 mmHg, Mild MR, sev LAE/RAE.  [name] Chronic constipation    takes OTC stool softener  . COPD (chronic obstructive pulmonary disease) ([place])    "one dr says COPD; one dr says emphysema" (09/18/2017)  . DEPRESSION    takes Zoloft and Doxepin daily  . Diverticulitis   . DYSKINESIA, ESOPHAGUS   . Emphysema of lung [facility])    "one dr says COPD; one dr says emphysema" (09/18/2017)  . Essential hypertension       . FIBROMYALGIA   . GERD (gastroesophageal reflux disease)       . Glaucoma   . HYPERLIPIDEMIA    a. Intolerant to statins.  . INSOMNIA    takes Ambien nightly  . Myocardial infarction [facility])    a. 2012 Myoview notable for prior infarct;  b. 03/2015 Lexiscan CL: EF 37%, diff HK, small area of inferior infarct from apex to base-->Med Rx.  . Myocardial infarction ([place])   . O2 dependent    "2.5L q hs & prn" (09/18/2017)  . Paroxysmal atrial fibrillation ([facility])    a. CHA2DS2VASc = 3--> takes Coumadin;  b. 03/15/2015  Successful TEE/DCCV;  c. 03/2015 recurrent afib, Amio d/c'd in setting of hyperthyroidism.  . Peripheral neuropathy   . Pneumonia 12/2016  . Rash and other nonspecific skin eruption 04/12/2009   no cause found saw dermatologists x 2 and allergist  . SLEEP APNEA, OBSTRUCTIVE    a. doesn't use CPAP  . Syncope    a. 03/2015 s/p MDT LINQ.  [name] Type II diabetes mellitus ([place])        Past Surgical History:  Procedure Laterality Date  . ACNE CYST REMOVAL     2 on back   . AV NODE ABLATION N/A 10/25/2017   Procedure: AV NODE ABLATION;  Surgeon: Deboraha Sprang, MD;  Location: [place] CV LAB;  Service: Cardiovascular;  Laterality: N/A;  . BIV ICD INSERTION CRT-D N/A 09/18/2017   Procedure: BIV ICD INSERTION CRT-D;  Surgeon: Deboraha Sprang, MD;  Location: [place] CV LAB;  Service: Cardiovascular;  Laterality: N/A;  . CARDIAC CATHETERIZATION N/A 03/21/2016   Procedure: Right/Left Heart Cath and Coronary Angiography;  Surgeon: Larey Dresser, MD;  Location: [place] CV LAB;  Service: Cardiovascular;  Laterality: N/A;  . CARDIOVERSION  04/18/2012   Procedure: CARDIOVERSION;  Surgeon: Fay Records, MD;  Location: [facility]  OR;  Service: Cardiovascular;  Laterality: N/A;  . CARDIOVERSION  04/25/2012   Procedure: CARDIOVERSION;  Surgeon: Thayer Headings, MD;  Location: [place];  Service: Cardiovascular;  Laterality: N/A;  . CARDIOVERSION  04/25/2012   Procedure: CARDIOVERSION;  Surgeon: Fay Records, MD;  Location: [place];  Service: Cardiovascular;  Laterality: N/A;  . CARDIOVERSION  05/09/2012   Procedure: CARDIOVERSION;  Surgeon: Sherren Mocha, MD;  Location: [place];  Service: Cardiovascular;  Laterality: N/A;  changed from crenshaw to cooper by trish/leone-endo  . CARDIOVERSION N/A 03/15/2015   Procedure: CARDIOVERSION;  Surgeon: Thayer Headings, MD;  Location: [facility] ENDOSCOPY;  Service: Cardiovascular;  Laterality: N/A;  . COLONOSCOPY    . COLONOSCOPY WITH PROPOFOL N/A 10/21/2014   Procedure: COLONOSCOPY  WITH PROPOFOL;  Surgeon: Ladene Artist, MD;  Location: [facility] ENDOSCOPY;  Service: Endoscopy;  Laterality: N/A;  . EP IMPLANTABLE DEVICE N/A 04/06/2015   Procedure: Loop Recorder Insertion;  Surgeon: Evans Lance, MD;  Location: [place] CV LAB;  Service: Cardiovascular;  Laterality: N/A;  . ESOPHAGOGASTRODUODENOSCOPY    . JOINT REPLACEMENT    . LOOP RECORDER REMOVAL N/A 09/18/2017   Procedure: LOOP RECORDER REMOVAL;  Surgeon: Deboraha Sprang, MD;  Location: [place] CV LAB;  Service: Cardiovascular;  Laterality: N/A;  . RIGHT/LEFT HEART CATH AND CORONARY ANGIOGRAPHY N/A 01/28/2017   Procedure: Right/Left Heart Cath and Coronary Angiography;  Surgeon: Larey Dresser, MD;  Location: [place] CV LAB;  Service: Cardiovascular;  Laterality: N/A;  . TEE WITHOUT CARDIOVERSION  04/25/2012   Procedure: TRANSESOPHAGEAL ECHOCARDIOGRAM (TEE);  Surgeon: Thayer Headings, MD;  Location: [place];  Service: Cardiovascular;  Laterality: N/A;  . TEE WITHOUT CARDIOVERSION N/A 03/15/2015   Procedure: TRANSESOPHAGEAL ECHOCARDIOGRAM (TEE);  Surgeon: Thayer Headings, MD;  Location: [place];  Service: Cardiovascular;  Laterality: N/A;  . TONSILLECTOMY AND ADENOIDECTOMY    . TOTAL KNEE ARTHROPLASTY Right 06/15/2014   Procedure: TOTAL KNEE ARTHROPLASTY;  Surgeon: Renette Butters, MD;  Location: [place];  Service: Orthopedics;  Laterality: Right;   Social History   Socioeconomic History  . Marital status: Divorced    Spouse name: Not on file  . Number of children: 2  . Years of education: Not on file  . Highest education level: Not on file  Occupational History  . Occupation: retired/disabled. prev worked in Therapist, sports.    Employer: DISABLED  Social Needs  . Financial resource strain: Not on file  . Food insecurity:    Worry: Not on file    Inability: Not on file  . Transportation needs:    Medical: Not on file    Non-medical: Not on file  Tobacco Use  . Smoking status: Former Smoker     Packs/day: 2.00    Years: 30.00    Pack years: 60.00    Types: Cigarettes    Last attempt to quit: 10/16/2007    Years since quitting: 10.9  . Smokeless tobacco: Never Used  Substance and Sexual Activity  . Alcohol use: No  . Drug use: No  . Sexual activity: Not Currently  Lifestyle  . Physical activity:    Days per week: Not on file    Minutes per session: Not on file  . Stress: Not on file  Relationships  . Social connections:    Talks on phone: Not on file    Gets together: Not on file    Attends religious service: Not on file    Active member  of club or organization: Not on file    Attends meetings of clubs or organizations: Not on file    Relationship status: Not on file  Other Topics Concern  . Not on file  Social History Narrative   Lives alone.   Allergies  Allergen Reactions  . Amiodarone Other (See Comments)    hyperthyroidism  . Statins Other (See Comments)    myalgia  . Tape Other (See Comments)    Skin Tears Use Paper Tape Only   Family History  Problem Relation Age of Onset  . COPD Mother   . Asthma Mother   . Colon polyps Mother   . Allergies Mother   . Hypothyroidism Mother   . Asthma Maternal Grandmother   . Colon cancer Neg Hx     Current Outpatient Medications (Endocrine & Metabolic):  .  predniSONE (DELTASONE) 10 MG tablet, 3 tabs by mouth per day for 3 days,2tabs per day for 3 days,1tab per day for 3 days  Current Outpatient Medications (Cardiovascular):  .  carvedilol (COREG) 12.5 MG tablet, TAKE 1 TABLET (12.5 MG TOTAL) BY MOUTH 2 (TWO) TIMES DAILY WITH A MEAL. [name]  digoxin (LANOXIN) 0.25 MG tablet, TAKE 0.5 TABLETS (0.125 MG TOTAL) BY MOUTH DAILY. [name]  ezetimibe (ZETIA) 10 MG tablet, Take 1 tablet (10 mg total) by mouth daily. .  rosuvastatin (CRESTOR) 5 MG tablet, Take 1 tablet (5 mg total) by mouth daily. .  sacubitril-valsartan (ENTRESTO) 24-26 MG, Take 1 tablet by mouth 2 (two) times daily. [name]  spironolactone (ALDACTONE) 25 MG tablet,  Take 1 tablet (25 mg total) by mouth daily. [name]  torsemide (DEMADEX) 20 MG tablet, Take 2 tablets (40 mg total) by mouth 2 (two) times daily. Take 2 tablets in the morning (40mg ) and 1 tablet (20 mg) in the evening.  Current Outpatient Medications (Respiratory):  .  albuterol (VENTOLIN HFA) 108 (90 Base) MCG/ACT inhaler, INHALE 2 PUFFS 4 TIMES A DAY as needed (Patient taking differently: Inhale 2 puffs into the lungs every 6 (six) hours as needed for wheezing or shortness of breath. INHALE 2 PUFFS 4 TIMES A DAY as needed) .  fluticasone (FLONASE) 50 MCG/ACT nasal spray, PLACE 2 SPRAYS INTO BOTH NOSTRILS DAILY AS NEEDED FOR ALLERGIES OR RHINITIS. [name]  Fluticasone-Salmeterol (ADVAIR DISKUS) 250-50 MCG/DOSE AEPB, Inhale 1 puff into the lungs 2 (two) times daily. (Patient taking differently: Inhale 1 puff into the lungs 2 (two) times daily as needed (shortness of breath). )  Current Outpatient Medications (Analgesics):  .  acetaminophen (TYLENOL) 500 MG tablet, Take 500 mg by mouth every 6 (six) hours as needed. .  traMADol (ULTRAM) 50 MG tablet, TAKE 1 TABLET BY MOUTH EVERY 6 HOURS AS NEEDED  Current Outpatient Medications (Hematological):  .  warfarin (COUMADIN) 5 MG tablet, Take as directed by anticoagulation clinic.  Current Outpatient Medications (Other):  [name]  ALPRAZolam (XANAX) 0.5 MG tablet, TAKE 2 TABLETS BY MOUTH AT BEDTIME .  carisoprodol (SOMA) 350 MG tablet, TAKE 1 TABLET BY MOUTH 3 TIMES A DAY AS NEEDED FOR MUSCLE SPASM .  clobetasol ointment (TEMOVATE) 3.26 %, Apply 1 application topically 2 (two) times daily. [name]  doxepin (SINEQUAN) 10 MG capsule, TAKE 2 CAPSULES BY MOUTH AT BEDTIME. .  hydrocortisone 2.5 % cream, APPLY TO AFFECTED AREA TWICE A DAY .  hydrOXYzine (ATARAX/VISTARIL) 10 MG tablet, TAKE 1 TABLET BY MOUTH THREE TIMES A DAY AS NEEDED .  ketoconazole (NIZORAL) 2 % cream, APPLY TO AFFECTED AREA EVERY DAY .  lidocaine (LIDODERM) 5 %, APPLY 1 PATCH TO AFFECTED AREA FOR UP TO 12  HOURS . DO NOT USE MORE THAN ONE PATCH IN 24 HOURS .  methotrexate (RHEUMATREX) 2.5 MG tablet, TAKE 4 TABS EVERY WEEK (Patient taking differently: TAKE 6 TABS EVERY WEEK) .  omeprazole (PRILOSEC) 20 MG capsule, Take 1 capsule (20 mg total) by mouth daily with supper. [name]  omeprazole (PRILOSEC) 20 MG capsule, TAKE 1 CAPSULE BY MOUTH TWICE A DAY .  sertraline (ZOLOFT) 100 MG tablet, TAKE 2 TABLETS BY MOUTH AT BEDTIME .  silver sulfADIAZINE (SILVADENE) 1 % cream, APPLY TO AFFECTED AREA EVERY DAY .  traZODone (DESYREL) 100 MG tablet, TAKE 1/2 TABLET BY MOUTH EVERY DAY AT BEDTIME AS NEEDED .  traZODone (DESYREL) 50 MG tablet, Take 1 tablet (50 mg total) by mouth at bedtime as needed for sleep. .  TUMS 500 MG chewable tablet, Chew 500-2,000 mg by mouth every 4 (four) hours as needed for indigestion or heartburn.  .  valACYclovir (VALTREX) 1000 MG tablet, TAKE 1 TABLET BY MOUTH THREE TIMES A DAY .  venlafaxine XR (EFFEXOR-XR) 37.5 MG 24 hr capsule, TAKE ONE CAPSULE BY MOUTH EVERY DAY WITH BREAKFAST .  zolpidem (AMBIEN) 10 MG tablet, TAKE 1 TABLET BY MOUTH EVERYDAY AT BEDTIME    Past medical history, social, surgical and family history all reviewed in electronic medical record.  No pertanent information unless stated regarding to the chief complaint.   Review of Systems:  No headache, visual changes, nausea, vomiting, diarrhea, constipation, dizziness, abdominal pain, skin rash, fevers, chills, night sweats, weight loss, swollen lymph nodes, body aches, joint swelling, , chest pain, shortness of breath, mood changes.  Positive muscle aches   Objective     General: No apparent distress alert and oriented x3 mood and affect normal, dressed appropriately.  HEENT: Pupils equal, extraocular movements intact  Respiratory: Patient's speak in full sentences and does not appear short of breath  Cardiovascular: No lower extremity edema, non tender, no erythema  Skin: Warm dry intact with no signs of  infection or rash on extremities or on axial skeleton.  Abdomen: Soft nontender patient has lost a significant amount of weight recently. Neuro: Cranial nerves II through XII are intact, neurovascularly intact in all extremities with 2+ DTRs and 2+ pulses.  Lymph: No lymphadenopathy of posterior or anterior cervical chain or axillae bilaterally.   Gait mildly antalgic MSK:  Non tender with full range of motion and good stability and symmetric strength and tone of  elbows, wrist, hip, and ankles bilaterally.   Knee: Left valgus deformity noted.  Abnormal thigh to calf ratio.  Tender to palpation over medial and PF joint line.  ROM full in flexion and extension and lower leg rotation. instability with valgus force.  painful patellar compression. Patellar glide with moderate crepitus. Patellar and quadriceps tendons unremarkable. Hamstring and quadriceps strength is normal. Contralateral knee shows mild arthritic changes as well  Right shoulder shows the patient does have atrophy noted.  Decreased range of motion in all planes.  4-5 rotator cuff strength.  Mild crepitus with range of motion contralateral shoulder shows some weakness as well but not as severe  After informed written and verbal consent, patient was seated on exam table. Right shoulder was prepped with alcohol swab and utilizing posterior approach, patient's right glenohumeral space was injected with 4:1  marcaine 0.5%: Kenalog 40mg /dL. Patient tolerated the procedure well without immediate complications.  After informed written and verbal consent, patient was seated  on exam table. Left knee was prepped with alcohol swab and utilizing anterolateral approach, patient's left knee space was injected with 4:1  marcaine 0.5%: Kenalog 40mg /dL. Patient tolerated the procedure well without immediate complications.      Impression and Recommendations:      The above documentation has been reviewed and is accurate and complete Zachary Pulley, DO       Note: This dictation was prepared with Dragon dictation along with smaller phrase technology. Any transcriptional errors that result from this process are unintentional.

## 2018-09-22 NOTE — Assessment & Plan Note (Signed)
Mild flare, cont MTX, for short course prednisone

## 2018-09-22 NOTE — Assessment & Plan Note (Signed)
Repeat injection given today.  Tolerated procedure well.  Patient has been significantly more active.  Discussed which activities to do which wants to avoid.  Patient will follow-up with me again in 4 to 8 weeks

## 2018-09-23 ENCOUNTER — Ambulatory Visit (INDEPENDENT_AMBULATORY_CARE_PROVIDER_SITE_OTHER): Payer: Medicare HMO | Admitting: General Practice

## 2018-09-23 DIAGNOSIS — J449 Chronic obstructive pulmonary disease, unspecified: Secondary | ICD-10-CM | POA: Diagnosis not present

## 2018-09-23 DIAGNOSIS — Z7901 Long term (current) use of anticoagulants: Secondary | ICD-10-CM

## 2018-09-23 LAB — POCT INR: INR: 1.3 — AB (ref 2.0–3.0)

## 2018-09-23 NOTE — Patient Instructions (Addendum)
Pre visit review using our clinic review tool, if applicable. No additional management support is needed unless otherwise documented below in the visit note.  Take 1 1/2 tablets today, tomorrow and Thursday (12/10, 12/11, 12/12) and then continue to take 1 tablet daily except 1/2 tablet on Monday and Fridays and re-check in 1 to 2 weeks.  *Patient is not taking medication as prescribed.  He will be talking to Dr. [name] at Cardiology about starting Eliquis or Xarelto.

## 2018-09-24 ENCOUNTER — Encounter: Payer: Self-pay | Admitting: Cardiology

## 2018-09-25 ENCOUNTER — Other Ambulatory Visit: Payer: Self-pay

## 2018-09-25 DIAGNOSIS — J3089 Other allergic rhinitis: Secondary | ICD-10-CM

## 2018-09-25 DIAGNOSIS — Z8709 Personal history of other diseases of the respiratory system: Secondary | ICD-10-CM

## 2018-09-26 ENCOUNTER — Other Ambulatory Visit (HOSPITAL_COMMUNITY): Payer: Self-pay | Admitting: Pharmacist

## 2018-09-29 ENCOUNTER — Ambulatory Visit (INDEPENDENT_AMBULATORY_CARE_PROVIDER_SITE_OTHER): Payer: Medicare HMO

## 2018-09-29 DIAGNOSIS — Z95810 Presence of automatic (implantable) cardiac defibrillator: Secondary | ICD-10-CM

## 2018-09-29 DIAGNOSIS — I5022 Chronic systolic (congestive) heart failure: Secondary | ICD-10-CM | POA: Diagnosis not present

## 2018-09-30 ENCOUNTER — Telehealth: Payer: Self-pay

## 2018-09-30 NOTE — Telephone Encounter (Signed)
Remote ICM transmission received.  Attempted call to patient regarding ICM remote transmission and left detailed message, per DPR, to return call.    

## 2018-09-30 NOTE — Progress Notes (Signed)
EPIC Encounter for ICM Monitoring  Patient Name: [name] is a 62 y.o. male Date: 09/30/2018 Primary Care Physican: [name], MD Primary Cardiologist: [name] Electrophysiologist: Vergie Living Pacing: 98.9%      Today's Weight:  unknown                                                   Attempted call to patient and unable to reach.  Left detailed message, per DPR, regarding transmission and return call.  Transmission reviewed.    Thoracic impedance abnormal suggesting fluid accumulation starting 09/27/2018.  Impedance also abnormal from 09/04/2018 through 09/24/2018.   Prescribed: Torsemide 20 mg take 2 tablets (40 mg total) in the morning and 1 tablet (20 mg total) in the evening.   Labs: 07/14/2018 Creatinine 1.32, BUN 21, Potassium 4.6, Sodium 136, eGFR 56->60 03/27/2018 Creatinine 1.09, BUN 14, Potassium 3.7, Sodium 138, eGFR >60  03/31/2018 Creatinine 1.01, BUN 19, Potassium 4.4, Sodium 138, eGFR >60  03/27/2018 Creatinine 0.94, BUN 16, Potassium 5.0, Sodium 141  03/18/2018 Creatinine 0.94, BUN 26, Potassium 4.6, Sodium 140  A complete set of results can be found in Results Review.  Recommendations: Left voice mail with ICM number and encouraged to call if experiencing any fluid symptoms.  Follow-up plan: ICM clinic phone appointment on 10/06/2018.  Office visit with Dr [name] 10/16/2018.  Copy of ICM check sent to Dr. [name] and Dr [name] for review and if any recommendations will call back.   3 month ICM trend: 09/29/2018    1 Year ICM trend:       [name], RN 09/30/2018 4:48 PM

## 2018-10-01 ENCOUNTER — Other Ambulatory Visit: Payer: Self-pay | Admitting: Internal Medicine

## 2018-10-01 NOTE — Telephone Encounter (Signed)
Done erx 

## 2018-10-04 NOTE — Progress Notes (Signed)
Increase torsemide to 40 mg bid with BMET 1 week.

## 2018-10-06 ENCOUNTER — Ambulatory Visit (INDEPENDENT_AMBULATORY_CARE_PROVIDER_SITE_OTHER): Payer: Medicare HMO

## 2018-10-06 DIAGNOSIS — I5022 Chronic systolic (congestive) heart failure: Secondary | ICD-10-CM

## 2018-10-06 DIAGNOSIS — Z95810 Presence of automatic (implantable) cardiac defibrillator: Secondary | ICD-10-CM

## 2018-10-06 MED ORDER — TORSEMIDE 20 MG PO TABS: ORAL_TABLET | ORAL | 3 refills | Status: DC

## 2018-10-06 NOTE — Progress Notes (Signed)
Attempted patient call and left message to return call for physician recommendations.

## 2018-10-06 NOTE — Progress Notes (Signed)
Next ICM remote transmission scheduled for 11/17/2018 since patient has defib office check 10/16/2018 with Chanetta Marshall, NP

## 2018-10-06 NOTE — Addendum Note (Signed)
Addended by: [name] on: 10/06/2018 02:34 PM   Modules accepted: Orders

## 2018-10-06 NOTE — Progress Notes (Signed)
Spoke with patient and advised Dr [name] recommended to Increase torsemide to 40 mg bid with BMET 1 week.  Patient has appointment with Dr [name] on 10/13/2018 and will have BMET drawn that day.

## 2018-10-07 NOTE — Progress Notes (Signed)
EPIC Encounter for ICM Monitoring  Patient Name: [name] is a 62 y.o. male Date: 10/07/2018 Primary Care Physican: [name], MD Primary Cardiologist:[place] Electrophysiologist:Klein Bi-V Pacing:100% Today's Weight: unknown   Attempted call to patient and unable to reach.Left detailed message, per DPR, regarding transmission and return call. Transmission reviewed.   Thoracic impedance returned to normal after Dr [name] increased long term Torsemide  Prescribed: Torsemide20 mg take 2 tablets (40 mg total) in the morning and 2 tablets (40 mg total) in the evening.  Labs: 07/14/2018 Creatinine1.32, Elaina Pattee, Potassium4.6, Sodium136, eGFR56->60 03/27/2018 Creatinine1.09, BUN14, Potassium3.7, Sodium138, eGFR>60  03/31/2018 Creatinine1.01, BUN19, Potassium4.4, Sodium138, eGFR>60  03/27/2018 Creatinine0.94, BUN16, Potassium5.0, Sodium141  03/18/2018 Creatinine0.94, BUN26, Potassium4.6, Sodium140 A complete set of results can be found in Results Review.  Recommendations:Left voice mail with ICM number and encouraged to call if experiencing any fluid symptoms.  Follow-up plan: ICM clinic phone appointment on1/23/2020. Office visit with Dr [name] 10/13/2018 and Chanetta Marshall, NP 11/14/2018.  Copy of ICM check sent to Dr.Klein and Dr [name].   3 month ICM trend: 10/06/2018    1 Year ICM trend:       [name], RN 10/07/2018 11:17 AM

## 2018-10-12 DIAGNOSIS — G4733 Obstructive sleep apnea (adult) (pediatric): Secondary | ICD-10-CM | POA: Diagnosis not present

## 2018-10-13 ENCOUNTER — Ambulatory Visit (HOSPITAL_COMMUNITY)
Admission: RE | Admit: 2018-10-13 | Discharge: 2018-10-13 | Disposition: A | Discharge status: Home / Self Care | Payer: Medicare HMO | Source: Ambulatory Visit | Attending: Cardiology | Admitting: Cardiology

## 2018-10-13 VITALS — BP 130/92 | HR 84 | Wt 229.6 lb

## 2018-10-13 DIAGNOSIS — Z95810 Presence of automatic (implantable) cardiac defibrillator: Secondary | ICD-10-CM | POA: Insufficient documentation

## 2018-10-13 DIAGNOSIS — I5042 Chronic combined systolic (congestive) and diastolic (congestive) heart failure: Secondary | ICD-10-CM

## 2018-10-13 DIAGNOSIS — Z79891 Long term (current) use of opiate analgesic: Secondary | ICD-10-CM | POA: Insufficient documentation

## 2018-10-13 DIAGNOSIS — E785 Hyperlipidemia, unspecified: Secondary | ICD-10-CM | POA: Diagnosis not present

## 2018-10-13 DIAGNOSIS — J449 Chronic obstructive pulmonary disease, unspecified: Secondary | ICD-10-CM | POA: Diagnosis not present

## 2018-10-13 DIAGNOSIS — K219 Gastro-esophageal reflux disease without esophagitis: Secondary | ICD-10-CM | POA: Diagnosis not present

## 2018-10-13 DIAGNOSIS — I5022 Chronic systolic (congestive) heart failure: Secondary | ICD-10-CM

## 2018-10-13 DIAGNOSIS — Z79899 Other long term (current) drug therapy: Secondary | ICD-10-CM | POA: Insufficient documentation

## 2018-10-13 DIAGNOSIS — Z7901 Long term (current) use of anticoagulants: Secondary | ICD-10-CM | POA: Diagnosis not present

## 2018-10-13 DIAGNOSIS — Z87891 Personal history of nicotine dependence: Secondary | ICD-10-CM | POA: Insufficient documentation

## 2018-10-13 DIAGNOSIS — I2510 Atherosclerotic heart disease of native coronary artery without angina pectoris: Secondary | ICD-10-CM | POA: Insufficient documentation

## 2018-10-13 DIAGNOSIS — I4821 Permanent atrial fibrillation: Secondary | ICD-10-CM | POA: Diagnosis not present

## 2018-10-13 DIAGNOSIS — N400 Enlarged prostate without lower urinary tract symptoms: Secondary | ICD-10-CM | POA: Insufficient documentation

## 2018-10-13 DIAGNOSIS — I428 Other cardiomyopathies: Secondary | ICD-10-CM | POA: Insufficient documentation

## 2018-10-13 DIAGNOSIS — Z9981 Dependence on supplemental oxygen: Secondary | ICD-10-CM | POA: Insufficient documentation

## 2018-10-13 DIAGNOSIS — R21 Rash and other nonspecific skin eruption: Secondary | ICD-10-CM | POA: Diagnosis not present

## 2018-10-13 DIAGNOSIS — I4819 Other persistent atrial fibrillation: Secondary | ICD-10-CM | POA: Diagnosis not present

## 2018-10-13 DIAGNOSIS — I442 Atrioventricular block, complete: Secondary | ICD-10-CM | POA: Diagnosis not present

## 2018-10-13 DIAGNOSIS — I110 Hypertensive heart disease with heart failure: Secondary | ICD-10-CM | POA: Diagnosis not present

## 2018-10-13 LAB — CBC
HEMATOCRIT: 45.6 % (ref 39.0–52.0)
Hemoglobin: 14.9 g/dL (ref 13.0–17.0)
MCH: 33.9 pg (ref 26.0–34.0)
MCHC: 32.7 g/dL (ref 30.0–36.0)
MCV: 103.6 fL — ABNORMAL HIGH (ref 80.0–100.0)
Platelets: 200 10*3/uL (ref 150–400)
RBC: 4.4 MIL/uL (ref 4.22–5.81)
RDW: 13.5 % (ref 11.5–15.5)
WBC: 7.9 10*3/uL (ref 4.0–10.5)
nRBC: 0 % (ref 0.0–0.2)

## 2018-10-13 LAB — COMPREHENSIVE METABOLIC PANEL
ALK PHOS: 50 U/L (ref 38–126)
ALT: 53 U/L — ABNORMAL HIGH (ref 0–44)
ANION GAP: 10 (ref 5–15)
AST: 26 U/L (ref 15–41)
Albumin: 4.4 g/dL (ref 3.5–5.0)
BUN: 17 mg/dL (ref 8–23)
CO2: 26 mmol/L (ref 22–32)
Calcium: 9.6 mg/dL (ref 8.9–10.3)
Chloride: 105 mmol/L (ref 98–111)
Creatinine, Ser: 0.98 mg/dL (ref 0.61–1.24)
GFR calc Af Amer: >60 mL/min (ref >60)
GFR calc non Af Amer: >60 mL/min (ref >60)
Glucose, Bld: 91 mg/dL (ref 70–99)
Potassium: 4.5 mmol/L (ref 3.5–5.1)
Sodium: 141 mmol/L (ref 135–145)
Total Bilirubin: 0.7 mg/dL (ref 0.3–1.2)
Total Protein: 7.3 g/dL (ref 6.5–8.1)

## 2018-10-13 LAB — DIGOXIN LEVEL: Digoxin Level: 0.4 ng/mL — ABNORMAL LOW (ref 0.8–2.0)

## 2018-10-13 MED ORDER — TORSEMIDE 20 MG PO TABS: ORAL_TABLET | ORAL | 3 refills | Status: DC

## 2018-10-13 MED ORDER — ROSUVASTATIN CALCIUM 5 MG PO TABS: 5.0000 mg | ORAL_TABLET | Freq: Every day | ORAL | 11 refills | Status: DC

## 2018-10-13 MED ORDER — SACUBITRIL-VALSARTAN 24-26 MG PO TABS: 1.0000 | ORAL_TABLET | Freq: Two times a day (BID) | ORAL | 3 refills | Status: DC

## 2018-10-13 NOTE — Patient Instructions (Signed)
Today you have been seen at the Heart failure clinic at [facility]   Medication changes: Start Entresto 24/26 BID Crestor 5mg  daily   You had lab work done today Lab work done today:  Scheduled Lab work: lab work in 2 weeks and in 2 months   We will call you if your lab work is abnormal.. No news is good news!!   Follow up with Dr. [name] in 3 months    Do the following things EVERYDAY: 1) Weigh yourself in the morning before breakfast. Write it down and keep it in a log. 2) Take your medicines as prescribed 3) Eat low salt foods-Limit salt (sodium) to 2000 mg per day.  4) Stay as active as you can everyday 5) Limit all fluids for the day to less than 2 liters

## 2018-10-13 NOTE — Progress Notes (Signed)
PCP: Dr. [name] EP: [name] HF Cardiology: [name]  62 y.o. with history of COPD on home oxygen, permanent atrial fibrillation, and chronic systolic CHF was referred by Dr. [name] for evaluation of CHF.  Amiodarone and DCCV were tried for atrial fibrillation in the past without success.   Patient has been noted to have a low EF since 2013.  EF improved by to normal by 2015 but dropped to 30-35% by 11/16.  Cath in 6/17 showed some CAD but not enough to cause cardiomyopathy.  He was admitted in 3/18 with PNA, atrial fibrillation/RVR, and acute on chronic systolic CHF.  Echo in 3/18 showed fall in EF to 10-15%.   I took him for right and left heart cath in 4/18, which showed nonobstructive CAD and relatively optimized filling pressures.    Syncope in 11/18. Loop recorder showed 6 second pause and periods of complete heart block. He had Medtronic BiV ICD placed in 12/18.  In 1/19, he had AV nodal ablation to promote BiV pacing.   Echo was done 6/19 with EF 25-30% with moderate RV dilation/mildly decreased systolic function. CPX 7/19 was submaximal, probably mild functional impairment.   Patient is seen today in followup of CHF/dyspnea. Has OSA but cannot tolerate CPAP, uses oxygen at night.  Skin has cleared up with methotrexate, itching much improved. He is now doing with [employer] PRE[employer] class.  He has done very well with this, stamina improving.  His weight continues to fall, down another 24 lbs.  He is feeling better with weight loss.  He is able to walk 1 mile on treadmill without dyspnea.  Rare chest pain only with very heavy exercise.  No palpitations or lightheadedness.  At last appointment, I had wanted him to start [facility], but he has not yet gotten patient assistance set up.  He was also supposed to start on Crestor to replace atorvastatin, but this never happened either.   Labs (4/18): pro-BNP 904, K 3.1, creatinine 1.39 => 1.08 with BUN 66 => 37, hgb 14.1.  Labs (5/18): hemoglobin 13.1 Labs (6/18): K  4, creatinine 1.3, BNP 294 Labs (7/18): K 3.8, creatinine 1.23, LDL 96, HDL 42 Labs (8/18): K 4.5, creatinine 1.22 Labs (10/18): digoxin 0.4 Labs (12/18): LDL 115, HDL 37, TGs 214, K 4.2, creatinine 0.84 Labs (1/19): K 4.4, creatinine 0.75, pro-BNP 433 Labs (2/19): digoxin 0.4 Labs (3/19): LDL 121, hgb 12.8, K 4.5, creatinine 0.98 Labs (6/19): LDL 48, HDL 45, K 5, creatinine 0.94, digoxin 0.3 Labs (9/19): K 3.7, creatinine 1.09 => 1.32, digoxin 0.2 Labs (11/19): LDL 115  PMH: 1. Asthma 2. COPD: On home oxygen. Prior smoker.  3. Atrial fibrillation: Permanent.  He failed DCCV and amiodarone in the past . 4. HTN 5. GERD 6. Hyperlipidemia: Myalgias with atorvastatin 7. BPH.  8. Chronic systolic CHF: Nonischemic cardiomyopathy.  - EF 25% by TEE in 7/13.  Normalized on 2015 echo. - Echo (11/16): EF 30-35%.  - LHC/RHC (6/17): 80% ostial stenosis small D1, 30-40% pLAD.  Mean RA 4, PA 26/10, mean PCWP 11, CI 2.33.  - Echo (3/18): EF 10-15%, diffuse hypokinesis, severe LV dilation, moderate RV dilation with severely decreased systolic function.  - LHC/RHC (4/18): 40% proximal LAD.  Mean RA 8, PA 37/10 mean 22, mean PCWP 22, LVEDP 9, CI 2.39. - Medtronic BiV ICD placement in 12/18 with AV nodal ablation to promote BiV pacing in 1/19.  - Echo (6/19): EF 25-30% with mild LV dilation, moderately dilated RV with mildly decreased systolic  function, IVC not dilated, PASP 23 mmHg.  - CPX (7/19): peak VO2 17.6 (78% predicted), VE/VCO2 slope 31, RER 1.02 => submaximal, probably mild functional impairment.  9. Morbid obesity 10. ILR in place.  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 Complete heart block: s/p Medtronic CRT-D.   Social History   Socioeconomic History  . Marital status: Divorced    Spouse name: Not on file  . Number of children: 2  . Years of education: Not on file  . Highest education level: Not on file  Occupational History  . Occupation: retired/disabled. prev worked in  Therapist, sports.    Employer: DISABLED  Social Needs  . Financial resource strain: Not on file  . Food insecurity:    Worry: Not on file    Inability: Not on file  . Transportation needs:    Medical: Not on file    Non-medical: Not on file  Tobacco Use  . Smoking status: Former Smoker    Packs/day: 2.00    Years: 30.00    Pack years: 60.00    Types: Cigarettes    Last attempt to quit: 10/16/2007    Years since quitting: 11.0  . Smokeless tobacco: Never Used  Substance and Sexual Activity  . Alcohol use: No  . Drug use: No  . Sexual activity: Not Currently  Lifestyle  . Physical activity:    Days per week: Not on file    Minutes per session: Not on file  . Stress: Not on file  Relationships  . Social connections:    Talks on phone: Not on file    Gets together: Not on file    Attends religious service: Not on file    Active member of club or organization: Not on file    Attends meetings of clubs or organizations: Not on file    Relationship status: Not on file  . Intimate partner violence:    Fear of current or ex partner: Not on file    Emotionally abused: Not on file    Physically abused: Not on file    Forced sexual activity: Not on file  Other Topics Concern  . Not on file  Social History Narrative   Lives alone.   Family History  Problem Relation Age of Onset  . COPD Mother   . Asthma Mother   . Colon polyps Mother   . Allergies Mother   . Hypothyroidism Mother   . Asthma Maternal Grandmother   . Colon cancer Neg Hx    ROS: All systems reviewed and negative except as per HPI.   Current Outpatient Medications  Medication Sig Dispense Refill  . acetaminophen (TYLENOL) 500 MG tablet Take 500 mg by mouth every 6 (six) hours as needed.    . ALPRAZolam (XANAX) 0.5 MG tablet TAKE 2 TABLETS BY MOUTH AT BEDTIME 60 tablet 2  . carisoprodol (SOMA) 350 MG tablet TAKE 1 TABLET BY MOUTH 3 TIMES A DAY AS NEEDED FOR MUSCLE SPASM 90 tablet 5  . carvedilol (COREG)  12.5 MG tablet TAKE 1 TABLET (12.5 MG TOTAL) BY MOUTH 2 (TWO) TIMES DAILY WITH A MEAL. 180 tablet 3  . digoxin (LANOXIN) 0.25 MG tablet TAKE 0.5 TABLETS (0.125 MG TOTAL) BY MOUTH DAILY. 30 tablet 5  . doxepin (SINEQUAN) 10 MG capsule TAKE 2 CAPSULES BY MOUTH AT BEDTIME. 180 capsule 2  . fluticasone (FLONASE) 50 MCG/ACT nasal spray PLACE 2 SPRAYS INTO BOTH NOSTRILS DAILY AS NEEDED FOR ALLERGIES OR RHINITIS. 16 g 0  .  hydrocortisone 2.5 % cream APPLY TO AFFECTED AREA TWICE A DAY 28.35 g 5  . hydrOXYzine (ATARAX/VISTARIL) 10 MG tablet TAKE 1 TABLET BY MOUTH THREE TIMES A DAY AS NEEDED 60 tablet 2  . lidocaine (LIDODERM) 5 % APPLY 1 PATCH TO AFFECTED AREA FOR UP TO 12 HOURS . DO NOT USE MORE THAN ONE PATCH IN 24 HOURS 90 patch 1  . methotrexate (RHEUMATREX) 2.5 MG tablet TAKE 4 TABS EVERY WEEK (Patient taking differently: TAKE 6 TABS EVERY WEEK) 16 tablet 0  . omeprazole (PRILOSEC) 20 MG capsule Take 1 capsule (20 mg total) by mouth daily with supper. 90 capsule 1  . omeprazole (PRILOSEC) 20 MG capsule TAKE 1 CAPSULE BY MOUTH TWICE A DAY 180 capsule 2  . silver sulfADIAZINE (SILVADENE) 1 % cream APPLY TO AFFECTED AREA EVERY DAY 50 g 3  . spironolactone (ALDACTONE) 25 MG tablet Take 1 tablet (25 mg total) by mouth daily. 30 tablet 3  . torsemide (DEMADEX) 20 MG tablet Take 2 tablets in the morning (40mg  total) by mouth and 1 tablets (20 mg total) in the evening. 360 tablet 3  . traMADol (ULTRAM) 50 MG tablet TAKE 1 TABLET BY MOUTH EVERY 6 HOURS AS NEEDED 30 tablet 2  . traZODone (DESYREL) 100 MG tablet TAKE 1/2 TABLET BY MOUTH EVERY DAY AT BEDTIME AS NEEDED 45 tablet 1  . traZODone (DESYREL) 50 MG tablet Take 1 tablet (50 mg total) by mouth at bedtime as needed for sleep. 90 tablet 1  . TUMS 500 MG chewable tablet Chew 500-2,000 mg by mouth every 4 (four) hours as needed for indigestion or heartburn.     . valACYclovir (VALTREX) 1000 MG tablet TAKE 1 TABLET BY MOUTH THREE TIMES A DAY 21 tablet 2  .  venlafaxine XR (EFFEXOR-XR) 37.5 MG 24 hr capsule TAKE ONE CAPSULE BY MOUTH EVERY DAY WITH BREAKFAST 90 capsule 1  . warfarin (COUMADIN) 5 MG tablet Take 1 tablet daily except 1/2 tablet on Monday and Friday or Take as directed by anticoagulation clinic. 90 tablet 1  . zolpidem (AMBIEN) 10 MG tablet TAKE 1 TABLET BY MOUTH EVERYDAY AT BEDTIME 90 tablet 1  . albuterol (VENTOLIN HFA) 108 (90 Base) MCG/ACT inhaler INHALE 2 PUFFS 4 TIMES A DAY as needed (Patient not taking: Reported on 10/13/2018) 54 Inhaler 3  . clobetasol ointment (TEMOVATE) 8.14 % Apply 1 application topically 2 (two) times daily. (Patient not taking: Reported on 10/13/2018) 60 g 1  . ezetimibe (ZETIA) 10 MG tablet Take 1 tablet (10 mg total) by mouth daily. (Patient not taking: Reported on 10/13/2018) 90 tablet 3  . Fluticasone-Salmeterol (ADVAIR DISKUS) 250-50 MCG/DOSE AEPB Inhale 1 puff into the lungs 2 (two) times daily. (Patient not taking: Reported on 10/13/2018) 180 each 3  . ketoconazole (NIZORAL) 2 % cream APPLY TO AFFECTED AREA EVERY DAY (Patient not taking: Reported on 10/13/2018) 15 g 0  . predniSONE (DELTASONE) 10 MG tablet 3 tabs by mouth per day for 3 days,2tabs per day for 3 days,1tab per day for 3 days (Patient taking differently: Pt states taking PRN when skin condition worsening) 18 tablet 0  . rosuvastatin (CRESTOR) 5 MG tablet Take 1 tablet (5 mg total) by mouth daily. (Patient not taking: Reported on 10/13/2018) 30 tablet 5  . rosuvastatin (CRESTOR) 5 MG tablet Take 1 tablet (5 mg total) by mouth at bedtime. 30 tablet 11  . sacubitril-valsartan (ENTRESTO) 24-26 MG Take 1 tablet by mouth 2 (two) times daily. 180 tablet 3  .  sertraline (ZOLOFT) 100 MG tablet TAKE 2 TABLETS BY MOUTH AT BEDTIME (Patient not taking: Reported on 10/13/2018) 180 tablet 3   No current facility-administered medications for this encounter.    BP (!) 130/92   Pulse 84   Wt 104.1 kg (229 lb 9.6 oz)   SpO2 96%   BMI 32.94 kg/m  General:  NAD Neck: No JVD, no thyromegaly or thyroid nodule.  Lungs: Clear to auscultation bilaterally with normal respiratory effort. CV: Nondisplaced PMI.  Heart regular S1/S2, no S3/S4, no murmur.  No peripheral edema.  No carotid bruit.  Normal pedal pulses.  Abdomen: Soft, nontender, no hepatosplenomegaly, no distention.  Skin: Intact without lesions or rashes.  Neurologic: Alert and oriented x 3.  Psych: Normal affect. Extremities: No clubbing or cyanosis.  HEENT: Normal.   Assessment/Plan:  1. Atrial fibrillation: Permanent.  Now s/p AV nodal ablation to allow effective BiV pacing.  - We discussed switching from warfarin to DOAC, but it appears that DOACs would end up being too expensive for him.  2. COPD: No longer smoking.  He is on home oxygen at night.   3. Chronic systolic CHF: Nonischemic cardiomyopathy based on 6/17 cath. However, he had a recent significant fall in EF from 30-35% to 10-15% on echo from 3/18.  LHC/RHC in 4/18 showed nonobstructive CAD and relatively optimized filling pressures.  He now has Medtronic CRT-D device with AV nodal ablation to allow BiV pacing.  Echo 6/19 with EF 25-30% with mildly decreased RV systolic function. CPX 7/19 was submaximal but likely only mild functional limitation from CHF.  He is not volume overloaded by exam.  NYHA class II symptoms.  Weight is down considerably, doing very well with [employer] PRE[employer] class.  - Continue Coreg 12.5 mg bid.   - Continue spironolactone 25 mg daily.        - He needs to start Entresto 24/26 bid.  He should be able to get a month for free, will have nurse discuss this with him.  Will also give samples.  Filled out patient assistance form today as well. BMET today and again in 2 wks.   - Can decrease torsemide to 40 qam/20 qpm with initiation of Entresto.  - Continue digoxin 0.125 daily, check level.  4. CAD: Nonobstructive on 4/18 cath.  No exertional chest pain.  - He is on warfarin so no ASA.  - Stopped atorvastatin and  Zetia due to myalgias.  I will try him on Crestor 5 mg daily.  If he has myalgias with this, will need Repatha.  Check LFTs/lipids in 2 months.   5. Complete heart block: Now has Medtronic CRT-D device and s/p AV nodal ablation.  6. Rash: Spongiotic dermatitis.  Now on steroids and methotrexate, much improved.   Followup in 3 months.   Loralie Champagne 10/13/2018

## 2018-10-14 ENCOUNTER — Telehealth: Payer: Self-pay

## 2018-10-14 ENCOUNTER — Telehealth (HOSPITAL_COMMUNITY): Payer: Self-pay | Admitting: Licensed Clinical Social Worker

## 2018-10-14 NOTE — Telephone Encounter (Signed)
Pt and physician portions of [employer] Patient Application for assistance with [name] completed and faxed to [employer]- fax confirmation received  CSW will continue to follow and assist as needed  Jorge Ny, [place] Worker [facility] [phone]

## 2018-10-14 NOTE — Telephone Encounter (Signed)
Attempted return call to patient as requested by voice mail message stating he was returning my call from last week.  Left detailed message per DPR that 12/23 ICM remote transmission fluid levels returned to normal.  Advised to call if he has any fluid symptoms and scheduled next transmission from home 11/06/2018.

## 2018-10-16 ENCOUNTER — Encounter: Payer: Medicare HMO | Admitting: Nurse Practitioner

## 2018-10-17 ENCOUNTER — Other Ambulatory Visit: Payer: Self-pay | Admitting: Internal Medicine

## 2018-10-17 NOTE — Telephone Encounter (Signed)
Copied from [place] [id number]. Topic: Quick Communication - See Telephone Encounter >> Oct 17, 2018 10:37 AM Ahmed Prima L wrote: CRM for notification. See Telephone encounter for: 10/17/18.  Patient called to see if we knew why he could not get in touch with his dermatologist, Dr Hubert Azure. He said that their number is disconnected at the [place] office and the [place] office. I called myself and it is not working anymore. He said that he went by her office yesterday and the office was closed. He said that he is trying to get his methotrexate (RHEUMATREX) 2.5 MG tablet refilled. He said that he does not know what to do and would like to know if Dr [name] would refill it for him. He said that he takes it 7 pills at one time once a week. He would like to speak to a nurse.  [id number] [name], [place] [address]

## 2018-10-17 NOTE — Telephone Encounter (Signed)
Requested medication (s) are due for refill today: yes  Requested medication (s) are on the active medication list: yes  Last refill:  12/23/17  Future visit scheduled: yes  Notes to clinic:     Requested Prescriptions  Pending Prescriptions Disp Refills   methotrexate (RHEUMATREX) 2.5 MG tablet 16 tablet 0    Sig: Caution:Chemotherapy. Protect from light.     Not Delegated - Immunology: Immunosuppressive Agents - methotrexate Failed - 10/17/2018 12:27 PM      Failed - This refill cannot be delegated      Failed - ALT in normal range and within 90 days    ALT  Date Value Ref Range Status  10/13/2018 53 (H) 0 - 44 U/L Final         Passed - AST in normal range and within 90 days    AST  Date Value Ref Range Status  10/13/2018 26 15 - 41 U/L Final         Passed - Cr in normal range and within 90 days    Creat  Date Value Ref Range Status  03/19/2016 0.92 0.70 - 1.25 mg/dL Final   Creatinine, Ser  Date Value Ref Range Status  10/13/2018 0.98 0.61 - 1.24 mg/dL Final         Passed - HCT in normal range and within 90 days    HCT  Date Value Ref Range Status  10/13/2018 45.6 39.0 - 52.0 % Final   Hematocrit  Date Value Ref Range Status  12/19/2017 38.0 37.5 - 51.0 % Final         Passed - HGB in normal range and within 90 days    Hemoglobin  Date Value Ref Range Status  10/13/2018 14.9 13.0 - 17.0 g/dL Final  12/19/2017 12.8 (L) 13.0 - 17.7 g/dL Final         Passed - PLT in normal range and within 90 days    Platelets  Date Value Ref Range Status  10/13/2018 200 150 - 400 K/uL Final  12/19/2017 208 150 - 379 x10E3/uL Final         Passed - RBC in normal range and within 90 days    RBC  Date Value Ref Range Status  10/13/2018 4.40 4.22 - 5.81 MIL/uL Final         Passed - WBC in normal range and within 90 days    WBC  Date Value Ref Range Status  10/13/2018 7.9 4.0 - 10.5 K/uL Final         Passed - Valid encounter within last 3 months    Recent  Outpatient Visits          3 weeks ago Chronic left shoulder pain   [place], Williamson M, DO   3 weeks ago Type 2 diabetes mellitus without complication, without long-term current use of insulin [facility])   [facility] -Georges Mouse, MD   2 months ago Rotator cuff arthropathy, left   [place], Smyth, DO   3 months ago Chronic pain of left knee   [place], Olevia Bowens, DO   6 months ago Preventative health care   [facility] -Georges Mouse, MD      Future Appointments            In 1 month Tamala Julian Olevia Bowens, [place], [facility]  In 5 months [name], Hunt Oris, MD [place], [facility]

## 2018-10-17 NOTE — Telephone Encounter (Signed)
Sorry, this is not a medication that is normally refilled per internal medicine;  Please ask pt to request from his RA provider

## 2018-10-20 MED ORDER — METHOTREXATE 2.5 MG PO TABS: ORAL_TABLET | ORAL | 0 refills | Status: DC

## 2018-10-21 ENCOUNTER — Ambulatory Visit (INDEPENDENT_AMBULATORY_CARE_PROVIDER_SITE_OTHER): Payer: Medicare HMO | Admitting: General Practice

## 2018-10-21 DIAGNOSIS — Z7901 Long term (current) use of anticoagulants: Secondary | ICD-10-CM | POA: Diagnosis not present

## 2018-10-21 LAB — POCT INR: INR: 1.8 — AB (ref 2.0–3.0)

## 2018-10-21 NOTE — Patient Instructions (Addendum)
Pre visit review using our clinic review tool, if applicable. No additional management support is needed unless otherwise documented below in the visit note.  Take 1 1/2 tablets today (1/7) and then change dosage and take 1 tablet daily except 1/2 tablet on Mondays.  Re-check in 4 weeks.

## 2018-10-24 DIAGNOSIS — J449 Chronic obstructive pulmonary disease, unspecified: Secondary | ICD-10-CM | POA: Diagnosis not present

## 2018-10-27 ENCOUNTER — Ambulatory Visit (HOSPITAL_COMMUNITY)
Admission: RE | Admit: 2018-10-27 | Discharge: 2018-10-27 | Disposition: A | Discharge status: Home / Self Care | Payer: Medicare HMO | Source: Ambulatory Visit | Attending: Cardiology | Admitting: Cardiology

## 2018-10-27 DIAGNOSIS — L308 Other specified dermatitis: Secondary | ICD-10-CM | POA: Diagnosis not present

## 2018-10-27 DIAGNOSIS — I5042 Chronic combined systolic (congestive) and diastolic (congestive) heart failure: Secondary | ICD-10-CM | POA: Insufficient documentation

## 2018-10-27 LAB — BASIC METABOLIC PANEL
ANION GAP: 9 (ref 5–15)
BUN: 22 mg/dL (ref 8–23)
CO2: 23 mmol/L (ref 22–32)
Calcium: 8.9 mg/dL (ref 8.9–10.3)
Chloride: 107 mmol/L (ref 98–111)
Creatinine, Ser: 1.17 mg/dL (ref 0.61–1.24)
GFR calc Af Amer: >60 mL/min (ref >60)
Glucose, Bld: 90 mg/dL (ref 70–99)
Potassium: 3.9 mmol/L (ref 3.5–5.1)
Sodium: 139 mmol/L (ref 135–145)

## 2018-10-27 LAB — HEPATIC FUNCTION PANEL
ALK PHOS: 46 U/L (ref 38–126)
ALT: 88 U/L — ABNORMAL HIGH (ref 0–44)
AST: 35 U/L (ref 15–41)
Albumin: 3.7 g/dL (ref 3.5–5.0)
Bilirubin, Direct: 0.2 mg/dL (ref 0.0–0.2)
Indirect Bilirubin: 0.6 mg/dL (ref 0.3–0.9)
Total Bilirubin: 0.8 mg/dL (ref 0.3–1.2)
Total Protein: 6.3 g/dL — ABNORMAL LOW (ref 6.5–8.1)

## 2018-10-27 LAB — LIPID PANEL
Cholesterol: 184 mg/dL (ref 0–200)
HDL: 38 mg/dL — ABNORMAL LOW (ref >40)
LDL Cholesterol: 124 mg/dL — ABNORMAL HIGH (ref 0–99)
Total CHOL/HDL Ratio: 4.8 RATIO
Triglycerides: 108 mg/dL (ref <150)
VLDL: 22 mg/dL (ref 0–40)

## 2018-10-28 ENCOUNTER — Telehealth (HOSPITAL_COMMUNITY): Payer: Self-pay

## 2018-10-28 NOTE — Telephone Encounter (Signed)
Pt called to go over lab results, had to get off phone to move his truck will call back to schedule lab work and confirm medication Is he taking Crestor? Will not increase further with elevated ALT.  Repeat LFTs in about 10 days to see if trending down.

## 2018-10-29 ENCOUNTER — Other Ambulatory Visit: Payer: Self-pay | Admitting: Internal Medicine

## 2018-10-29 ENCOUNTER — Telehealth (HOSPITAL_COMMUNITY): Payer: Self-pay | Admitting: *Deleted

## 2018-10-29 DIAGNOSIS — I5022 Chronic systolic (congestive) heart failure: Secondary | ICD-10-CM

## 2018-10-29 NOTE — Telephone Encounter (Signed)
Notes recorded by Harvie Junior, CMA on 10/29/2018 at 9:40 AM EST Pt called back he states he is taking crestor. He is aware of results and verbalized understanding. Lab appt scheduled. ------  Notes recorded by Wilder Glade, LPN on 04/23/6268 at 4:85 PM EST Pt called to go over lab results, had to get off phone to move his truck will call back to schedule lab work and confirm medication Is he taking Crestor? Will not increase further with elevated ALT. Repeat LFTs in about 10 days to see if trending down. ------  Notes recorded by Larey Dresser, MD on 10/28/2018 at 4:02 PM EST Is he taking Crestor? Will not increase further with elevated ALT. Repeat LFTs in about 10 days to see if trending down.

## 2018-10-29 NOTE — Telephone Encounter (Signed)
-----   Message from Larey Dresser, MD sent at 10/28/2018  4:02 PM EST ----- Is he taking Crestor? Will not increase further with elevated ALT.  Repeat LFTs in about 10 days to see if trending down.

## 2018-10-30 ENCOUNTER — Other Ambulatory Visit (HOSPITAL_COMMUNITY): Payer: Self-pay

## 2018-10-30 MED ORDER — TORSEMIDE 20 MG PO TABS: ORAL_TABLET | ORAL | 1 refills | Status: DC

## 2018-11-06 ENCOUNTER — Telehealth: Payer: Self-pay

## 2018-11-06 ENCOUNTER — Ambulatory Visit (INDEPENDENT_AMBULATORY_CARE_PROVIDER_SITE_OTHER): Payer: Medicare HMO

## 2018-11-06 DIAGNOSIS — Z95810 Presence of automatic (implantable) cardiac defibrillator: Secondary | ICD-10-CM | POA: Diagnosis not present

## 2018-11-06 DIAGNOSIS — I5042 Chronic combined systolic (congestive) and diastolic (congestive) heart failure: Secondary | ICD-10-CM | POA: Diagnosis not present

## 2018-11-06 MED ORDER — TORSEMIDE 20 MG PO TABS: ORAL_TABLET | ORAL | 1 refills | Status: DC

## 2018-11-06 NOTE — Progress Notes (Signed)
Attempted call to patient to advise of Dr Claris Gladden recommendation to increase torsemide back to 40 mg bid and get BMET in 10 days.  Left message to return call.

## 2018-11-06 NOTE — Progress Notes (Signed)
Have him increase torsemide back to 40 mg bid and get BMET in 10 days.

## 2018-11-06 NOTE — Telephone Encounter (Signed)
Remote ICM transmission received.  Attempted call to patient regarding ICM remote transmission and left message, per DPR, to return call.    

## 2018-11-06 NOTE — Progress Notes (Signed)
EPIC Encounter for ICM Monitoring  Patient Name: [name] is a 63 y.o. male Date: 11/06/2018 Primary Care Physican: [name], MD Primary Cardiologist:Mc[place] Electrophysiologist:Klein Bi-V Pacing:99.9% Today's Weight: unknown   Attempted call to patient and unable to reach.Left message to return call. Transmission reviewed.   Thoracic impedanceabnormal suggesting fluid accumulation since 10/16/2018.  Prescribed: Torsemide20 mg take 2 tablets (40 mg total) in the morning and 1 tablet (20 mg total) in the evening (decreased at 10/13/2018 office visit from 40 mg bid).  Labs: 10/27/2018 Creatinine 1.17, BUN 22, Potassium 3.9, Sodium 139, eGFR >60 10/13/2018 Creatinine 0.98, BUN 17, Potassium 4.5, Sodium 141, eGFR >60  07/14/2018 Creatinine1.32, BUN21, Potassium4.6, Sodium136, eGFR56->60 03/27/2018 Creatinine1.09, BUN14, Potassium3.7, Sodium138, eGFR>60  03/31/2018 Creatinine1.01, BUN19, Potassium4.4, Sodium138, eGFR>60  03/27/2018 Creatinine0.94, BUN16, Potassium5.0, Sodium141  03/18/2018 Creatinine0.94, BUN26, Potassium4.6, Sodium140 A complete set of results can be found in Results Review.  Recommendations: Unable to reach.  Copy of ICM check sent to Dr.Kleinand Dr [name].  Follow-up plan: ICM clinic phone appointment on 12/19/2018 since device will be checked in the office 11/19/2018 with Donnajean Lopes, [place].    3 month ICM trend: 11/06/2018    1 Year ICM trend:       [name], RN 11/06/2018 9:20 AM

## 2018-11-06 NOTE — Progress Notes (Signed)
Spoke with patient.   Advised Dr. [name] recommended to increase Torsemide 20 mg to 2 tablets (40 mg total) twice a day.  Also advised to have BMET lab drawn in 10 days and labs will be drawn at 11/21/2018 at HF clinic.  Patient verbalized understanding.  Confirmed pharmacy.  Patient has enough supply on hand and no refill needed at this time.

## 2018-11-06 NOTE — Progress Notes (Signed)
Patient returned call.  He reported he has dizziness when bending over but no shortness of breath.   SYMPTOMS: He does have indention when socks are removed at the end of the day.  Weight has been stable at 225 lbs.   DIET: He admits to eating foods that are higher in salt such as canned tuna, chicken, crackers and canned vegetables.  Encouraged to decreased salt intake by changing from canned vegetables to frozen vegetables, eliminate crackers and eat proteins such as baked chicken or fish.  He said he tries to eat what he can afford but will look to see if he can make some changes.   Recommendations: Decrease salt intake.   Explained since Torsemide was decreased 12/30 that it will be even more important to limit salt intake.   Advised if Dr [name] has any recommendations will call him back.  Next remote transmission scheduled 11/13/2018 to rehcheck fluid levels.  He has labs scheduled 1/27 at HF Clinic.

## 2018-11-08 LAB — CUP PACEART REMOTE DEVICE CHECK
Battery Remaining Longevity: 81 mo
Battery Voltage: 3 V
Brady Statistic AP VP Percent: 0 %
Brady Statistic AP VS Percent: 0 %
Brady Statistic AS VP Percent: 0 %
Brady Statistic RA Percent Paced: 0 %
HighPow Impedance: 76 Ohm
Implantable Lead Implant Date: 20181205
Implantable Lead Implant Date: 20181205
Implantable Lead Location: 753858
Implantable Lead Location: 753860
Implantable Lead Model: 4398
Implantable Pulse Generator Implant Date: 20181205
Lead Channel Impedance Value: 1007 Ohm
Lead Channel Impedance Value: 1026 Ohm
Lead Channel Impedance Value: 1026 Ohm
Lead Channel Impedance Value: 1083 Ohm
Lead Channel Impedance Value: 218.087
Lead Channel Impedance Value: 262.136
Lead Channel Impedance Value: 264.733
Lead Channel Impedance Value: 279.484
Lead Channel Impedance Value: 304 Ohm
Lead Channel Impedance Value: 4047 Ohm
Lead Channel Impedance Value: 418 Ohm
Lead Channel Impedance Value: 456 Ohm
Lead Channel Impedance Value: 456 Ohm
Lead Channel Impedance Value: 703 Ohm
Lead Channel Impedance Value: 722 Ohm
Lead Channel Impedance Value: 779 Ohm
Lead Channel Impedance Value: 817 Ohm
Lead Channel Pacing Threshold Amplitude: 0.625 V
Lead Channel Pacing Threshold Pulse Width: 0.4 ms
Lead Channel Sensing Intrinsic Amplitude: 6 mV
Lead Channel Sensing Intrinsic Amplitude: 6 mV
Lead Channel Setting Pacing Amplitude: 2 V
Lead Channel Setting Pacing Amplitude: 2.5 V
Lead Channel Setting Pacing Pulse Width: 0.4 ms
Lead Channel Setting Sensing Sensitivity: 0.3 mV
MDC IDC MSMT LEADCHNL LV IMPEDANCE VALUE: 276.59 Ohm
MDC IDC SESS DTM: 20191205081603
MDC IDC SET LEADCHNL LV PACING PULSEWIDTH: 0.6 ms
MDC IDC STAT BRADY AS VS PERCENT: 0 %
MDC IDC STAT BRADY RV PERCENT PACED: 99.78 %

## 2018-11-09 ENCOUNTER — Other Ambulatory Visit: Payer: Self-pay | Admitting: Internal Medicine

## 2018-11-10 ENCOUNTER — Other Ambulatory Visit (HOSPITAL_COMMUNITY): Payer: Medicare HMO

## 2018-11-12 ENCOUNTER — Telehealth (HOSPITAL_COMMUNITY): Payer: Self-pay | Admitting: Licensed Clinical Social Worker

## 2018-11-12 NOTE — Telephone Encounter (Signed)
CSW received notification from [employer] that pt is not currently eligible for their assistance with [name] CSW discussed copay grant through [facility] and pt is agreeable to applying.  Pt approved for $2500 for entresto assistance:  Pharmacy Card Information Pharmacy Card Id [id number] Group [id number] PCN [id number]  CSW called information into pharmacy and updated pt  Zachary Ny, LCSW Clinical Social Worker [facility] [phone]

## 2018-11-13 ENCOUNTER — Ambulatory Visit (INDEPENDENT_AMBULATORY_CARE_PROVIDER_SITE_OTHER): Payer: Medicare HMO

## 2018-11-13 DIAGNOSIS — Z95810 Presence of automatic (implantable) cardiac defibrillator: Secondary | ICD-10-CM

## 2018-11-13 DIAGNOSIS — I5042 Chronic combined systolic (congestive) and diastolic (congestive) heart failure: Secondary | ICD-10-CM

## 2018-11-14 ENCOUNTER — Ambulatory Visit (HOSPITAL_COMMUNITY)
Admission: RE | Admit: 2018-11-14 | Discharge: 2018-11-14 | Disposition: A | Discharge status: Home / Self Care | Payer: Medicare HMO | Source: Ambulatory Visit | Attending: Cardiology | Admitting: Cardiology

## 2018-11-14 ENCOUNTER — Telehealth (HOSPITAL_COMMUNITY): Payer: Self-pay | Admitting: Adult Health

## 2018-11-14 ENCOUNTER — Telehealth (HOSPITAL_COMMUNITY): Payer: Self-pay

## 2018-11-14 ENCOUNTER — Encounter: Payer: Medicare HMO | Admitting: Nurse Practitioner

## 2018-11-14 DIAGNOSIS — I428 Other cardiomyopathies: Secondary | ICD-10-CM | POA: Diagnosis not present

## 2018-11-14 DIAGNOSIS — I5022 Chronic systolic (congestive) heart failure: Secondary | ICD-10-CM

## 2018-11-14 DIAGNOSIS — I5042 Chronic combined systolic (congestive) and diastolic (congestive) heart failure: Secondary | ICD-10-CM

## 2018-11-14 LAB — BASIC METABOLIC PANEL
ANION GAP: 7 (ref 5–15)
BUN: 26 mg/dL — ABNORMAL HIGH (ref 8–23)
CO2: 26 mmol/L (ref 22–32)
Calcium: 9.3 mg/dL (ref 8.9–10.3)
Chloride: 108 mmol/L (ref 98–111)
Creatinine, Ser: 1.19 mg/dL (ref 0.61–1.24)
GFR calc non Af Amer: >60 mL/min (ref >60)
Glucose, Bld: 103 mg/dL — ABNORMAL HIGH (ref 70–99)
Potassium: 4.2 mmol/L (ref 3.5–5.1)
Sodium: 141 mmol/L (ref 135–145)

## 2018-11-14 LAB — HEPATIC FUNCTION PANEL
ALT: 24 U/L (ref 0–44)
AST: 25 U/L (ref 15–41)
Albumin: 3.7 g/dL (ref 3.5–5.0)
Alkaline Phosphatase: 50 U/L (ref 38–126)
Bilirubin, Direct: 0.1 mg/dL (ref 0.0–0.2)
Indirect Bilirubin: 0.6 mg/dL (ref 0.3–0.9)
Total Bilirubin: 0.7 mg/dL (ref 0.3–1.2)
Total Protein: 6.4 g/dL — ABNORMAL LOW (ref 6.5–8.1)

## 2018-11-14 LAB — DIGOXIN LEVEL: Digoxin Level: <0.2 ng/mL — ABNORMAL LOW (ref 0.8–2.0)

## 2018-11-14 NOTE — Progress Notes (Signed)
EPIC Encounter for ICM Monitoring  Patient Name: [name] is a 63 y.o. male Date: 11/14/2018 Primary Care Physican: [name], MD Primary Cardiologist:[place] Electrophysiologist:Klein Bi-V Pacing:99.9% Last Weight: 225 lbs  Today's Weight: 225 lbs   Heart failure questions reviewed.  Patient asymptomatic.  Patient reported HF clinic checked him today using ReDS vest because he was feeling dizzy.  He was told to hold Torsemide today and tomorrow then dosage should be 40 mg every AM and 20 mg PM.   Thoracic impedancereturned to normal after Torsemide increase on 11/06/2018.  Prescribed: Torsemide 20 mg to 2 tablets (40 mg total) every AM and 1 tablet (20 mg total) every PM.    Labs:  BMET scheduled for 11/21/2018 10/27/2018 Creatinine 1.17, BUN 22, Potassium 3.9, Sodium 139, eGFR >60 10/13/2018 Creatinine 0.98, BUN 17, Potassium 4.5, Sodium 141, eGFR >60  07/14/2018 Creatinine1.32, BUN21, Potassium4.6, Sodium136, eGFR56->60 03/27/2018 Creatinine1.09, BUN14, Potassium3.7, Sodium138, eGFR>60  03/31/2018 Creatinine1.01, BUN19, Potassium4.4, Sodium138, eGFR>60  03/27/2018 Creatinine0.94, BUN16, Potassium5.0, Sodium141  03/18/2018 Creatinine0.94, BUN26, Potassium4.6, Sodium140 A complete set of results can be found in Results Review.  Recommendations: No changes.  Encouraged to call for any fluid symptoms.  Follow-up plan: ICM clinic phone appointment on 12/19/2018.   Office appointment scheduled with HF clinic 12/15/2018.   Copy of ICM check sent to Dr. [name].   3 month ICM trend: 11/13/2018    1 Year ICM trend:       [name], RN 11/14/2018 8:10 AM

## 2018-11-14 NOTE — Patient Instructions (Signed)
Labs drawn today. Will call you if labs come back abnormal.  HOLD torsemide tonight and tomorrow then continue Sunday with taking 40 mg in the morning and 20 mg in the evening.  Will recheck labs next week.

## 2018-11-14 NOTE — Telephone Encounter (Signed)
Complaining of dizziness.  Reds Clip reading is 31%. Appears dry.   Hold torsemide today and tomorrow. He will then start torsemide 40 mg in am and 20 mg in pm.   Check BMET now.   Shannen Flansburg NP-C  11:47 AM

## 2018-11-14 NOTE — Addendum Note (Signed)
Encounter addended by: Vanice Sarah, CMA on: 11/14/2018 11:53 AM  Actions taken: Clinical Note Signed, Order list changed, Order Reconciliation Section accessed, Home Medications modified

## 2018-11-17 NOTE — Telephone Encounter (Signed)
Error

## 2018-11-18 ENCOUNTER — Ambulatory Visit (INDEPENDENT_AMBULATORY_CARE_PROVIDER_SITE_OTHER): Payer: Medicare HMO | Admitting: General Practice

## 2018-11-18 ENCOUNTER — Ambulatory Visit: Payer: Medicare HMO

## 2018-11-18 DIAGNOSIS — Z7901 Long term (current) use of anticoagulants: Secondary | ICD-10-CM | POA: Diagnosis not present

## 2018-11-18 LAB — POCT INR: INR: 2.6 (ref 2.0–3.0)

## 2018-11-18 NOTE — Patient Instructions (Signed)
Pre visit review using our clinic review tool, if applicable. No additional management support is needed unless otherwise documented below in the visit note. ° °Continue to take 1 tablet daily except 1/2 tablet on Mondays.  Re-check in 4 weeks. ° °

## 2018-11-19 ENCOUNTER — Ambulatory Visit: Payer: Medicare HMO | Admitting: Nurse Practitioner

## 2018-11-21 ENCOUNTER — Other Ambulatory Visit (HOSPITAL_COMMUNITY): Payer: Self-pay | Admitting: Cardiology

## 2018-11-21 ENCOUNTER — Other Ambulatory Visit (HOSPITAL_COMMUNITY): Payer: Medicare HMO

## 2018-11-24 ENCOUNTER — Ambulatory Visit (HOSPITAL_COMMUNITY)
Admission: RE | Admit: 2018-11-24 | Discharge: 2018-11-24 | Disposition: A | Discharge status: Home / Self Care | Payer: Medicare HMO | Source: Ambulatory Visit | Attending: Cardiology | Admitting: Cardiology

## 2018-11-24 DIAGNOSIS — I5022 Chronic systolic (congestive) heart failure: Secondary | ICD-10-CM | POA: Diagnosis not present

## 2018-11-24 LAB — BASIC METABOLIC PANEL
Anion gap: 7 (ref 5–15)
BUN: 18 mg/dL (ref 8–23)
CO2: 26 mmol/L (ref 22–32)
Calcium: 9.1 mg/dL (ref 8.9–10.3)
Chloride: 106 mmol/L (ref 98–111)
Creatinine, Ser: 1.32 mg/dL — ABNORMAL HIGH (ref 0.61–1.24)
GFR calc Af Amer: >60 mL/min (ref >60)
GFR calc non Af Amer: 57 mL/min — ABNORMAL LOW (ref >60)
Glucose, Bld: 89 mg/dL (ref 70–99)
Potassium: 4.5 mmol/L (ref 3.5–5.1)
Sodium: 139 mmol/L (ref 135–145)

## 2018-11-25 ENCOUNTER — Other Ambulatory Visit: Payer: Self-pay | Admitting: Internal Medicine

## 2018-11-25 DIAGNOSIS — I4819 Other persistent atrial fibrillation: Secondary | ICD-10-CM

## 2018-11-25 DIAGNOSIS — I428 Other cardiomyopathies: Secondary | ICD-10-CM

## 2018-11-25 DIAGNOSIS — I255 Ischemic cardiomyopathy: Secondary | ICD-10-CM

## 2018-11-26 NOTE — Telephone Encounter (Signed)
Done erx 

## 2018-12-01 ENCOUNTER — Ambulatory Visit: Payer: Medicare HMO | Admitting: Family Medicine

## 2018-12-04 ENCOUNTER — Other Ambulatory Visit: Payer: Self-pay | Admitting: Internal Medicine

## 2018-12-05 ENCOUNTER — Ambulatory Visit: Payer: Self-pay | Admitting: Internal Medicine

## 2018-12-05 ENCOUNTER — Telehealth: Payer: Self-pay | Admitting: Internal Medicine

## 2018-12-05 ENCOUNTER — Ambulatory Visit (INDEPENDENT_AMBULATORY_CARE_PROVIDER_SITE_OTHER): Payer: Medicare HMO | Admitting: Internal Medicine

## 2018-12-05 ENCOUNTER — Encounter: Payer: Self-pay | Admitting: Internal Medicine

## 2018-12-05 DIAGNOSIS — L97923 Non-pressure chronic ulcer of unspecified part of left lower leg with necrosis of muscle: Secondary | ICD-10-CM | POA: Diagnosis not present

## 2018-12-05 DIAGNOSIS — E11622 Type 2 diabetes mellitus with other skin ulcer: Secondary | ICD-10-CM | POA: Diagnosis not present

## 2018-12-05 MED ORDER — SULFAMETHOXAZOLE-TRIMETHOPRIM 800-160 MG PO TABS: 1.0000 | ORAL_TABLET | Freq: Two times a day (BID) | ORAL | 0 refills | Status: DC

## 2018-12-05 NOTE — Telephone Encounter (Signed)
Pt called in c/o a wound on his left shin that is draining yellow bad smelling discharge.   He drained it himself 3 weeks ago to save money but now "It looks infected".    See triage notes.  I made him an appt with Dr. Sharlet Salina for today at 3:20.    Reason for Disposition . [1] Looks infected (spreading redness, red streak, pus) AND [2] fever  Answer Assessment - Initial Assessment Questions 1. LOCATION: "Where is the wound located?"      Left leg over shin bone.   Been there several weeks.   Blood blister.   I drained it myself 3 weeks ago.   Now it's a hole about size of small fingernail.   Yellow discharge with a bad smell to it.   2. WOUND APPEARANCE: "What does the wound look like?"      See above. 3. SIZE: If redness is present, ask: "What is the size of the red area?" (Inches, centimeters, or compare to size of a coin)      Size of pinky fingernail.    I was trying to save money so I drained it myself. 4. SPREAD: "What's changed in the last day?"  "Do you see any red streaks coming from the wound?"     I put gauze on it last night.   This morning it had yellow drainage on it. 5. ONSET: "When did it start to look infected?"      I have type II diabetes.   3 weeks ago. 6. MECHANISM: "How did the wound start, what was the cause?"     It was a blood blister that I drained myself 3 weeks ago.   It's a hole. 7. PAIN: "Is there any pain?" If so, ask: "How bad is the pain?"   (Scale 1-10; or mild, moderate, severe)     It's sore and red.   My legs stay red.   My heart is weak and keeps my legs red anyway. 8. FEVER: "Do you have a fever?" If so, ask: "What is your temperature, how was it measured, and when did it start?"     I've had chills. 9. OTHER SYMPTOMS: "Do you have any other symptoms?" (e.g., shaking chills, weakness, rash elsewhere on body)     No 10. PREGNANCY: "Is there any chance you are pregnant?" "When was your last menstrual period?"       N/A  Protocols used: WOUND  INFECTION-A-AH

## 2018-12-05 NOTE — Telephone Encounter (Signed)
Pharmacy informed okay to fill.

## 2018-12-05 NOTE — Telephone Encounter (Signed)
This is okay to dispense.

## 2018-12-05 NOTE — Assessment & Plan Note (Signed)
Appears infected today. Some concerns about sufficiency of blood flow due to CAD and systolic heart failure. He does not have hair on the legs indicating insufficiency. Rx for bactrim and needs follow up with PCP. Advised wound center referral but he does not feel financially able to do this now.

## 2018-12-05 NOTE — Telephone Encounter (Signed)
Copied from [place] [id number]. Topic: General - Other >> Dec 05, 2018  3:55 PM [name] wrote: Reason for CRM: Per pharmacy, the sulfamethoxazole-trimethoprim (BACTRIM DS,SEPTRA DS) 800-160 MG tablet [[id number]]  is going to interact with pt's INR and his methotrexate (RHEUMATREX) 2.5 MG tablet [[id number]] .  They wanted to make sure this was ok or needs to be switched to something else   Best Number [phone] [id number] [name], [place] [phone] (Phone)

## 2018-12-05 NOTE — Patient Instructions (Addendum)
We have sent in the antibiotic called bactrim to take 1 pill twice a day for 1 week.   We would like you to come back and see Dr. [name] in 2-3 weeks.

## 2018-12-05 NOTE — Telephone Encounter (Signed)
fyi

## 2018-12-05 NOTE — Progress Notes (Signed)
   Subjective:   Patient ID: [name], male    DOB: 08-Feb-1956, 63 y.o.   MRN: [id number]  HPI The patient is a 63 YO man diabetic that is coming in for wound on left shin. He also has very complicated medical history including A fib, chronic systolic heart failure, COPD and other. This showed up when his dog scratched the leg while walking. Started about 3 weeks ago and he tried to pop this about 3 weeks ago as it looked like a bruise and he thought he could drain it. Overall it is worsening and draining out yellow odorous discharge now. Denies fevers or chills. Some rash around the wound.   Review of Systems  Constitutional: Positive for activity change. Negative for appetite change, fatigue, fever and unexpected weight change.  Respiratory: Negative.   Cardiovascular: Negative.   Musculoskeletal: Positive for myalgias. Negative for arthralgias and back pain.  Skin: Positive for color change, rash and wound.  Neurological: Negative for syncope, weakness and numbness.  Hematological: Negative.   Psychiatric/Behavioral: Negative.     Objective:  Physical Exam Constitutional:      Appearance: He is well-developed. He is obese.  HENT:     Head: Normocephalic and atraumatic.  Neck:     Musculoskeletal: Normal range of motion.  Cardiovascular:     Rate and Rhythm: Normal rate.  Pulmonary:     Effort: Pulmonary effort is normal. No respiratory distress.     Breath sounds: No wheezing or rales.  Abdominal:     General: Bowel sounds are normal. There is no distension.     Palpations: Abdomen is soft.     Tenderness: There is no abdominal tenderness. There is no rebound.  Musculoskeletal:        General: Tenderness present.     Comments: No hair on bilateral LE  Skin:    General: Skin is warm and dry.     Comments: About 1-2 cm oval shaped lesion on the left shin, with central abscess which probes to muscle layer, not able to probe to bone. Some yellow drainage expressible. About  3-4 cm circular red rash surrounding.   Neurological:     Mental Status: He is alert and oriented to person, place, and time.     Coordination: Coordination normal.     Vitals:   12/05/18 1512  BP: 100/70  Pulse: 73  Temp: 97.7 F (36.5 C)  TempSrc: Oral  SpO2: 99%  Weight: 227 lb (103 kg)  Height: 5\' 10"  (1.778 m)    Assessment & Plan:

## 2018-12-11 ENCOUNTER — Ambulatory Visit: Payer: Self-pay | Admitting: Internal Medicine

## 2018-12-11 NOTE — Telephone Encounter (Signed)
  He called in c/o his glands under his jawbone being sore.   The inside of his mouth and gums are sore and raw feeling.  This has been going on for the last 2-3 days.    He was seen on 12/05/18 by Dr. Sharlet Salina for a wound on his leg that is infected.   He was started on Bactrim DS.  He also has a blister on his lower lip.   He has herpes simplex and said this looks like a herpes blister.  He has been taking the Valtrex for 3 days without any improvement with the soreness in his mouth.   In fact it's getting worse.  I called the flow coordinator at Dr. Nathanial Millman office and made her aware of the situation.     They are going to call him back.  I made the pt aware someone would be calling him back.    He was agreeable to this plan.  Triage notes sent to the practice.  Reason for Disposition . Caller has URGENT medication question about med that PCP prescribed and triager unable to answer question  Answer Assessment - Initial Assessment Questions 1. SYMPTOMS: "Do you have any symptoms?"     See above 2. SEVERITY: If symptoms are present, ask "Are they mild, moderate or severe?"     My glands in my neck and real sore and the inside of my mouth is very sore for the last 2 days I also have a blister on my bottom lip.    I have herpes simplex.   It looks like the herpes.    Sulfamethoxazole  Protocols used: MEDICATION QUESTION CALL-A-AH

## 2018-12-11 NOTE — Telephone Encounter (Signed)
Patient informed of MD response but wants to know if he should continue the bactrim?

## 2018-12-11 NOTE — Telephone Encounter (Signed)
Yes, continue bactrim

## 2018-12-11 NOTE — Telephone Encounter (Signed)
Bactrim should not cause current symptoms. If have rash or skin sloughing (falling off) needs ER emergently. Can schedule visit for evaluation if needed. Can continue valtrex.

## 2018-12-12 NOTE — Telephone Encounter (Signed)
LVM informing patient to continue bactrim

## 2018-12-15 ENCOUNTER — Encounter: Payer: Self-pay | Admitting: Internal Medicine

## 2018-12-15 ENCOUNTER — Other Ambulatory Visit (HOSPITAL_COMMUNITY): Payer: Medicare HMO

## 2018-12-15 ENCOUNTER — Ambulatory Visit (INDEPENDENT_AMBULATORY_CARE_PROVIDER_SITE_OTHER): Payer: Medicare HMO | Admitting: Internal Medicine

## 2018-12-15 VITALS — BP 120/82 | HR 80 | Ht 70.0 in | Wt 220.2 lb

## 2018-12-15 DIAGNOSIS — I442 Atrioventricular block, complete: Secondary | ICD-10-CM | POA: Diagnosis not present

## 2018-12-15 DIAGNOSIS — I4819 Other persistent atrial fibrillation: Secondary | ICD-10-CM | POA: Diagnosis not present

## 2018-12-15 DIAGNOSIS — I428 Other cardiomyopathies: Secondary | ICD-10-CM | POA: Diagnosis not present

## 2018-12-15 DIAGNOSIS — I5030 Unspecified diastolic (congestive) heart failure: Secondary | ICD-10-CM | POA: Diagnosis not present

## 2018-12-15 DIAGNOSIS — Z95810 Presence of automatic (implantable) cardiac defibrillator: Secondary | ICD-10-CM

## 2018-12-15 NOTE — Progress Notes (Signed)
He was      Patient Care Team: [name], MD as PCP - General Himmelrich, Bryson Ha, RD (Inactive) as Dietitian   HPI  [name] is a 63 y.o. male seen in followup for recent CRT implantation.    He has had syncope.  ILR had documented pauses greater than 6 seconds.  Not withstanding his right bundle branch block, it was very wide and so discussions with Dr. DM we elected CRT-D implantation.    He also has atrial fibrillation for which he was treated with amiodarone.  Having had recurrent and persistence of atrial fibrillation he underwent AV junction ablation following CRT implantation  He uses oxygen but not CPAP for his sleep apnea   He is considerably better following CRT.  He has more energy.  Less dyspnea and no edema.  DATE TEST EF    /2012 myoview   44 Prior IMI perfsion defect  /2015 myoview    57% Perfusion defect--MI vs bowel  2016 myoview 35-40 NO perfusion defect  6/16 Echo 40-45 Severe BAE  6/17 Cath 30 no CAD   3/18 Echo 20-25%   4/18 LHC/RHC  No CAD  6/19  Echo  20-25%    Antiarrhythmics Date  dofetilide 2013  amidoarone 2016   Date Cr K Dig Hgb  8/18 1.22 4.5  12.2  2/19  1.09 4.1 0.4   2/20 1.32 4.5     He has lost 40 pounds.  He is feeling better.  Unfortunately, he developed a cellulitis on his left leg was given an antibiotic to which he has had a oropharyngeal reaction manifested by glossitis and inflammation of his lips with bleeding.  This is prompted him to stop his warfarin.  He is also stopped his [name] because of persistent problems with hypotension and presyncope.    Past Medical History:  Diagnosis Date  . AICD (automatic cardioverter/defibrillator) present   . Anemia    supposed to be taking Vit B but doesn't  . ANXIETY    takes Xanax nightly  . Arthritis   . Asthma    Albuterol prn and Advair daily;also takes Prednisone daily  . Atrial fibrillation ([place]) 09/06/2015  . Cardiomyopathy [facility])    a. EF 25% TEE July 2013;  b. EF normalized 2015;  c. 03/2015 Echo: EF 40-45%, difrf HK, PASP 38 mmHg, Mild MR, sev LAE/RAE.  [name] Chronic constipation    takes OTC stool softener  . COPD (chronic obstructive pulmonary disease) ([place])    "one dr says COPD; one dr says emphysema" (09/18/2017)  . DEPRESSION    takes Zoloft and Doxepin daily  . Diverticulitis   . DYSKINESIA, ESOPHAGUS   . Emphysema of lung [facility])    "one dr says COPD; one dr says emphysema" (09/18/2017)  . Essential hypertension       . FIBROMYALGIA   . GERD (gastroesophageal reflux disease)       . Glaucoma   . HYPERLIPIDEMIA    a. Intolerant to statins.  . INSOMNIA    takes Ambien nightly  . Myocardial infarction [facility])    a. 2012 Myoview notable for prior infarct;  b. 03/2015 Lexiscan CL: EF 37%, diff HK, small area of inferior infarct from apex to base-->Med Rx.  . Myocardial infarction ([place])   . O2 dependent    "2.5L q hs & prn" (09/18/2017)  . Paroxysmal atrial fibrillation ([facility])    a. CHA2DS2VASc = 3--> takes Coumadin;  b. 03/15/2015 Successful TEE/DCCV;  c.  03/2015 recurrent afib, Amio d/c'd in setting of hyperthyroidism.  . Peripheral neuropathy   . Pneumonia 12/2016  . Rash and other nonspecific skin eruption 04/12/2009   no cause found saw dermatologists x 2 and allergist  . SLEEP APNEA, OBSTRUCTIVE    a. doesn't use CPAP  . Syncope    a. 03/2015 s/p MDT LINQ.  [name] Type II diabetes mellitus ([place])         Past Surgical History:  Procedure Laterality Date  . ACNE CYST REMOVAL     2 on back   . AV NODE ABLATION N/A 10/25/2017   Procedure: AV NODE ABLATION;  Surgeon: Deboraha Sprang, MD;  Location: [place] CV LAB;  Service: Cardiovascular;  Laterality: N/A;  . BIV ICD INSERTION CRT-D N/A 09/18/2017   Procedure: BIV ICD INSERTION CRT-D;  Surgeon: Deboraha Sprang, MD;  Location: [place] CV LAB;  Service: Cardiovascular;  Laterality: N/A;  . CARDIAC CATHETERIZATION N/A 03/21/2016   Procedure: Right/Left Heart Cath and Coronary Angiography;   Surgeon: Larey Dresser, MD;  Location: [place] CV LAB;  Service: Cardiovascular;  Laterality: N/A;  . CARDIOVERSION  04/18/2012   Procedure: CARDIOVERSION;  Surgeon: Fay Records, MD;  Location: [place];  Service: Cardiovascular;  Laterality: N/A;  . CARDIOVERSION  04/25/2012   Procedure: CARDIOVERSION;  Surgeon: Thayer Headings, MD;  Location: [place];  Service: Cardiovascular;  Laterality: N/A;  . CARDIOVERSION  04/25/2012   Procedure: CARDIOVERSION;  Surgeon: Fay Records, MD;  Location: [place];  Service: Cardiovascular;  Laterality: N/A;  . CARDIOVERSION  05/09/2012   Procedure: CARDIOVERSION;  Surgeon: Sherren Mocha, MD;  Location: [place];  Service: Cardiovascular;  Laterality: N/A;  changed from crenshaw to cooper by trish/leone-endo  . CARDIOVERSION N/A 03/15/2015   Procedure: CARDIOVERSION;  Surgeon: Thayer Headings, MD;  Location: [facility] ENDOSCOPY;  Service: Cardiovascular;  Laterality: N/A;  . COLONOSCOPY    . COLONOSCOPY WITH PROPOFOL N/A 10/21/2014   Procedure: COLONOSCOPY WITH PROPOFOL;  Surgeon: Ladene Artist, MD;  Location: [facility] ENDOSCOPY;  Service: Endoscopy;  Laterality: N/A;  . EP IMPLANTABLE DEVICE N/A 04/06/2015   Procedure: Loop Recorder Insertion;  Surgeon: Evans Lance, MD;  Location: [place] CV LAB;  Service: Cardiovascular;  Laterality: N/A;  . ESOPHAGOGASTRODUODENOSCOPY    . JOINT REPLACEMENT    . LOOP RECORDER REMOVAL N/A 09/18/2017   Procedure: LOOP RECORDER REMOVAL;  Surgeon: Deboraha Sprang, MD;  Location: [place] CV LAB;  Service: Cardiovascular;  Laterality: N/A;  . RIGHT/LEFT HEART CATH AND CORONARY ANGIOGRAPHY N/A 01/28/2017   Procedure: Right/Left Heart Cath and Coronary Angiography;  Surgeon: Larey Dresser, MD;  Location: [place] CV LAB;  Service: Cardiovascular;  Laterality: N/A;  . TEE WITHOUT CARDIOVERSION  04/25/2012   Procedure: TRANSESOPHAGEAL ECHOCARDIOGRAM (TEE);  Surgeon: Thayer Headings, MD;  Location: [place];  Service:  Cardiovascular;  Laterality: N/A;  . TEE WITHOUT CARDIOVERSION N/A 03/15/2015   Procedure: TRANSESOPHAGEAL ECHOCARDIOGRAM (TEE);  Surgeon: Thayer Headings, MD;  Location: [place];  Service: Cardiovascular;  Laterality: N/A;  . TONSILLECTOMY AND ADENOIDECTOMY    . TOTAL KNEE ARTHROPLASTY Right 06/15/2014   Procedure: TOTAL KNEE ARTHROPLASTY;  Surgeon: Renette Butters, MD;  Location: [place];  Service: Orthopedics;  Laterality: Right;    Current Outpatient Medications  Medication Sig Dispense Refill  . acetaminophen (TYLENOL) 500 MG tablet Take 500 mg by mouth every 6 (six) hours as needed.    [name] albuterol (VENTOLIN  HFA) 108 (90 Base) MCG/ACT inhaler INHALE 2 PUFFS 4 TIMES A DAY as needed 54 Inhaler 3  . ALPRAZolam (XANAX) 0.5 MG tablet TAKE 2 TABLETS BY MOUTH AT BEDTIME 60 tablet 2  . carisoprodol (SOMA) 350 MG tablet TAKE 1 TABLET BY MOUTH 3 TIMES A DAY AS NEEDED FOR MUSCLE SPASM 90 tablet 5  . carvedilol (COREG) 12.5 MG tablet TAKE 1 TABLET (12.5 MG TOTAL) BY MOUTH 2 (TWO) TIMES DAILY WITH A MEAL. 180 tablet 3  . clobetasol ointment (TEMOVATE) 4.19 % Apply 1 application topically 2 (two) times daily. 60 g 1  . digoxin (LANOXIN) 0.25 MG tablet TAKE 0.5 TABLETS (0.125 MG TOTAL) BY MOUTH DAILY. 30 tablet 5  . doxepin (SINEQUAN) 10 MG capsule TAKE 2 CAPSULES BY MOUTH AT BEDTIME. 180 capsule 2  . ezetimibe (ZETIA) 10 MG tablet Take 1 tablet (10 mg total) by mouth daily. 90 tablet 3  . fluticasone (FLONASE) 50 MCG/ACT nasal spray PLACE 2 SPRAYS INTO BOTH NOSTRILS DAILY AS NEEDED FOR ALLERGIES OR RHINITIS. 16 g 0  . Fluticasone-Salmeterol (ADVAIR DISKUS) 250-50 MCG/DOSE AEPB Inhale 1 puff into the lungs 2 (two) times daily. 180 each 3  . hydrocortisone 2.5 % cream APPLY TO AFFECTED AREA TWICE A DAY 28.35 g 5  . hydrOXYzine (ATARAX/VISTARIL) 10 MG tablet TAKE 1 TABLET BY MOUTH THREE TIMES A DAY AS NEEDED 60 tablet 2  . ketoconazole (NIZORAL) 2 % cream APPLY TO AFFECTED AREA EVERY DAY 15 g 0  .  lidocaine (LIDODERM) 5 % APPLY 1 PATCH TO AFFECTED AREA FOR UP TO 12 HOURS . DO NOT USE MORE THAN ONE PATCH IN 24 HOURS. 90 patch 1  . methotrexate (RHEUMATREX) 2.5 MG tablet TAKE 4 TABLETS BY MOUTH EVERY WEEK 48 tablet 1  . omeprazole (PRILOSEC) 20 MG capsule Take 1 capsule (20 mg total) by mouth daily with supper. 90 capsule 1  . omeprazole (PRILOSEC) 20 MG capsule TAKE 1 CAPSULE BY MOUTH TWICE A DAY 180 capsule 2  . predniSONE (DELTASONE) 10 MG tablet 3 tabs by mouth per day for 3 days,2tabs per day for 3 days,1tab per day for 3 days 18 tablet 0  . sertraline (ZOLOFT) 100 MG tablet TAKE 2 TABLETS BY MOUTH AT BEDTIME 180 tablet 3  . silver sulfADIAZINE (SILVADENE) 1 % cream APPLY TO AFFECTED AREA EVERY DAY 50 g 3  . spironolactone (ALDACTONE) 25 MG tablet TAKE 1 TABLET BY MOUTH EVERY DAY 90 tablet 1  . sulfamethoxazole-trimethoprim (BACTRIM DS,SEPTRA DS) 800-160 MG tablet Take 1 tablet by mouth 2 (two) times daily. 14 tablet 0  . torsemide (DEMADEX) 20 MG tablet Take 40 mg by mouth daily. Take 2 tabs (40 mg) in the morning and 1 tab (20 mg) in the evening.    . traMADol (ULTRAM) 50 MG tablet TAKE 1 TABLET BY MOUTH EVERY 6 HOURS AS NEEDED 30 tablet 2  . traZODone (DESYREL) 100 MG tablet TAKE 1/2 TABLET BY MOUTH EVERY DAY AT BEDTIME AS NEEDED 45 tablet 1  . traZODone (DESYREL) 50 MG tablet Take 1 tablet (50 mg total) by mouth at bedtime as needed for sleep. 90 tablet 1  . TUMS 500 MG chewable tablet Chew 500-2,000 mg by mouth every 4 (four) hours as needed for indigestion or heartburn.     . valACYclovir (VALTREX) 1000 MG tablet TAKE 1 TABLET BY MOUTH THREE TIMES A DAY 21 tablet 2  . venlafaxine XR (EFFEXOR-XR) 37.5 MG 24 hr capsule TAKE ONE CAPSULE BY MOUTH EVERY  DAY WITH BREAKFAST 90 capsule 1  . zolpidem (AMBIEN) 10 MG tablet TAKE 1 TABLET BY MOUTH EVERYDAY AT BEDTIME 90 tablet 1  . warfarin (COUMADIN) 5 MG tablet Take 1 tablet daily except 1/2 tablet on Monday and Friday or Take as directed by  anticoagulation clinic. (Patient not taking: Reported on 12/15/2018) 90 tablet 1   No current facility-administered medications for this visit.     Allergies  Allergen Reactions  . Amiodarone Other (See Comments)    hyperthyroidism  . Statins Other (See Comments)    myalgia  . Tape Other (See Comments)    Skin Tears Use Paper Tape Only    Review of Systems negative except from HPI and PMH  Physical Exam BP 120/82   Pulse 80   Ht 5\' 10"  (1.778 m)   Wt 220 lb 3.2 oz (99.9 kg)   SpO2 96%   BMI 31.60 kg/m  Well developed and Morbidly obese in no acute distress HENT normal Neck supple with JVP-flat Clear Device pocket well healed; without hematoma or erythema.  There is no tethering  Regular rate and rhythm, no gallop No  murmur Abd-soft with active BS No Clubbing cyanosis  edema Skin-warm and dry A & Oriented  Grossly normal sensory and motor function  ECG afib with Vpacing @ 80 -/16/42 Upright QRS lead V1 negative QRS lead I  Assessment and  Plan  Ischemic heart disease without significant obstructive disease by catheterization 6/17  HFrEF    CRT-D-[employer] The patient's device was interrogated.  The information was reviewed. No changes were made in the programming.      Cardiomyopathy-ischemic/nonischemic  Atrial fibrillation  Permanent   Complete heart block status post AV junction ablation  Syncope   Morbidly obese  He has lost 40+ pounds and is feeling better.  He unfortunately has developed a drug reaction to a superficial cellulitis antibiotic.  Cellulitis is better.  He is following up with his PCP regarding the drug reaction.  He is currently not taking an ACE inhibitor; his carvedilol dose is 12.5.  I suggested that we decrease the carvedilol to 6.25 and begin losartan.  He will see Dr. DM in the next few weeks and will defer final decisions to him  No bleeding.  Is stopped Coumadin secondary to his lip bleeding.  He will resume when the bleeding  abates.   We spent more than 50% of our >25 min visit in face to face counseling regarding the above      .

## 2018-12-15 NOTE — Patient Instructions (Signed)
Medication Instructions:  Your physician recommends that you continue on your current medications as directed. Please refer to the Current Medication list given to you today.  Labwork: None ordered.   Testing/Procedures: None ordered.  Follow-Up: Your physician recommends that you schedule a follow-up appointment in: One Year with Dr Klein.   Any Other Special Instructions Will Be Listed Below (If Applicable).     If you need a refill on your cardiac medications before your next appointment, please call your pharmacy.  

## 2018-12-16 ENCOUNTER — Ambulatory Visit: Payer: Medicare HMO

## 2018-12-18 ENCOUNTER — Ambulatory Visit (INDEPENDENT_AMBULATORY_CARE_PROVIDER_SITE_OTHER): Payer: Medicare HMO | Admitting: *Deleted

## 2018-12-18 DIAGNOSIS — I428 Other cardiomyopathies: Secondary | ICD-10-CM

## 2018-12-19 ENCOUNTER — Telehealth: Payer: Self-pay

## 2018-12-19 ENCOUNTER — Ambulatory Visit (INDEPENDENT_AMBULATORY_CARE_PROVIDER_SITE_OTHER): Payer: Medicare HMO

## 2018-12-19 DIAGNOSIS — Z95810 Presence of automatic (implantable) cardiac defibrillator: Secondary | ICD-10-CM | POA: Diagnosis not present

## 2018-12-19 DIAGNOSIS — I5022 Chronic systolic (congestive) heart failure: Secondary | ICD-10-CM

## 2018-12-19 LAB — CUP PACEART REMOTE DEVICE CHECK
Battery Remaining Longevity: 79 mo
Battery Voltage: 2.98 V
Brady Statistic AP VP Percent: 0 %
Brady Statistic AP VS Percent: 0 %
Brady Statistic AS VP Percent: 0 %
Brady Statistic AS VS Percent: 0 %
Brady Statistic RV Percent Paced: 99.97 %
Date Time Interrogation Session: 20200305062306
HighPow Impedance: 76 Ohm
Implantable Lead Implant Date: 20181205
Implantable Lead Implant Date: 20181205
Implantable Lead Location: 753858
Implantable Lead Location: 753860
Implantable Lead Model: 4398
Implantable Pulse Generator Implant Date: 20181205
Lead Channel Impedance Value: 1064 Ohm
Lead Channel Impedance Value: 1064 Ohm
Lead Channel Impedance Value: 1197 Ohm
Lead Channel Impedance Value: 1197 Ohm
Lead Channel Impedance Value: 245.538
Lead Channel Impedance Value: 287.631
Lead Channel Impedance Value: 316.116
Lead Channel Impedance Value: 316.116
Lead Channel Impedance Value: 361 Ohm
Lead Channel Impedance Value: 4047 Ohm
Lead Channel Impedance Value: 456 Ohm
Lead Channel Impedance Value: 475 Ohm
Lead Channel Impedance Value: 532 Ohm
Lead Channel Impedance Value: 779 Ohm
Lead Channel Impedance Value: 779 Ohm
Lead Channel Impedance Value: 893 Ohm
Lead Channel Pacing Threshold Amplitude: 0.75 V
Lead Channel Pacing Threshold Pulse Width: 0.4 ms
Lead Channel Sensing Intrinsic Amplitude: 6 mV
Lead Channel Sensing Intrinsic Amplitude: 6 mV
Lead Channel Setting Pacing Amplitude: 2 V
Lead Channel Setting Pacing Amplitude: 2.5 V
Lead Channel Setting Pacing Pulse Width: 0.4 ms
Lead Channel Setting Pacing Pulse Width: 0.6 ms
Lead Channel Setting Sensing Sensitivity: 0.3 mV
MDC IDC MSMT LEADCHNL LV IMPEDANCE VALUE: 287.631
MDC IDC MSMT LEADCHNL LV IMPEDANCE VALUE: 874 Ohm
MDC IDC STAT BRADY RA PERCENT PACED: 0 %

## 2018-12-19 NOTE — Telephone Encounter (Signed)
Remote ICM transmission received.  Attempted call to patient regarding ICM remote transmission and left detailed message, per DPR, to return call with next ICM remote transmission date of 12/29/2018.  Advised to return call for any fluid symptoms or questions.

## 2018-12-19 NOTE — Progress Notes (Signed)
EPIC Encounter for ICM Monitoring  Patient Name: [name] is a 63 y.o. male Date: 12/19/2018 Primary Care Physican: [name], MD Primary Cardiologist:[place] Electrophysiologist:Klein Bi-V Pacing:100% Last Weight: 225 lbs  Today's Weight: unknown   Attempted call to patient and unable to reach.  Left message to return call regarding transmission. Transmission reviewed.    Thoracic impedanceabnormalsuggesting fluid accumulation starting 11/13/2018.   Prescribed: Torsemide 20 mg to 2 tablets (40 mg total) every AM and 1 tablet (20 mg total) every PM.   Labs:   11/24/2018 Creatinine 1.32, BUN 18, Potassium 4.5, Sodium 139, GFR 57->60 01/13/2020Creatinine 1.17, BUN22, Potassium3.9, Sodium139, GFR>60 10/13/2018 Creatinine0.98, BUN17, Potassium4.5, Sodium141, GFR>60 07/14/2018 Creatinine1.32, BUN21, Potassium4.6, Sodium136, GFR56->60 03/27/2018 Creatinine1.09, BUN14, Potassium3.7, Sodium138, GFR>60  03/31/2018 Creatinine1.01, BUN19, Potassium4.4, Sodium138, GFR>60  03/27/2018 Creatinine0.94, BUN16, Potassium5.0, Sodium141  03/18/2018 Creatinine0.94, BUN26, Potassium4.6, Sodium140 A complete set of results can be found in Results Review.  Recommendations: Left voice mail with ICM number and encouraged to call back.  Follow-up plan: ICM clinic phone appointment on 12/29/2018.   Office appointment scheduled with HF clinic 01/09/2019.   Copy of ICM check sent to Dr. [name] and Dr [name] for review and recommendations if needed.   3 month ICM trend: 12/19/2018    1 Year ICM trend:       [name], RN 12/19/2018 1:01 PM

## 2018-12-23 ENCOUNTER — Encounter: Payer: Self-pay | Admitting: Internal Medicine

## 2018-12-23 ENCOUNTER — Ambulatory Visit (INDEPENDENT_AMBULATORY_CARE_PROVIDER_SITE_OTHER): Payer: Medicare HMO | Admitting: General Practice

## 2018-12-23 ENCOUNTER — Ambulatory Visit (INDEPENDENT_AMBULATORY_CARE_PROVIDER_SITE_OTHER): Payer: Medicare HMO | Admitting: Internal Medicine

## 2018-12-23 VITALS — BP 106/82 | HR 76 | Temp 98.1°F | Ht 70.0 in | Wt 221.0 lb

## 2018-12-23 DIAGNOSIS — Z7901 Long term (current) use of anticoagulants: Secondary | ICD-10-CM

## 2018-12-23 DIAGNOSIS — I10 Essential (primary) hypertension: Secondary | ICD-10-CM | POA: Diagnosis not present

## 2018-12-23 DIAGNOSIS — L97923 Non-pressure chronic ulcer of unspecified part of left lower leg with necrosis of muscle: Secondary | ICD-10-CM | POA: Diagnosis not present

## 2018-12-23 DIAGNOSIS — E11622 Type 2 diabetes mellitus with other skin ulcer: Secondary | ICD-10-CM

## 2018-12-23 DIAGNOSIS — K130 Diseases of lips: Secondary | ICD-10-CM | POA: Diagnosis not present

## 2018-12-23 LAB — POCT INR: INR: 1 — AB (ref 2.0–3.0)

## 2018-12-23 MED ORDER — PREDNISONE 10 MG PO TABS: ORAL_TABLET | ORAL | 0 refills | Status: DC

## 2018-12-23 MED ORDER — TRIAMCINOLONE ACETONIDE 0.1 % MT PSTE: 1.0000 "application " | PASTE | Freq: Two times a day (BID) | OROMUCOSAL | 2 refills | Status: DC

## 2018-12-23 NOTE — Assessment & Plan Note (Signed)
stable overall by history and exam, recent data reviewed with pt, and pt to continue medical treatment as before,  to f/u any worsening symptoms or concerns  

## 2018-12-23 NOTE — Patient Instructions (Signed)
Please take all new medication as prescribed - the paste steroid, and the prednisone  Please continue all other medications as before, and refills have been done if requested.  Please have the pharmacy call with any other refills you may need.  Please continue your efforts at being more active, low cholesterol diet, and weight control.  Please keep your appointments with your specialists as you may have planned

## 2018-12-23 NOTE — Assessment & Plan Note (Signed)
Mucositis like, for kenalog in orabase and predpac asd,  to f/u any worsening symptoms or concerns

## 2018-12-23 NOTE — Patient Instructions (Addendum)
Pre visit review using our clinic review tool, if applicable. No additional management support is needed unless otherwise documented below in the visit note.   Patient has a sore on his bottom lip and has not taken coumadin for 10 days due to bleeding.  Patient is also going to see cardiology next week about possible starting Eliquis or Xarelto.  New dosing instruction have been given to patient and are as follows:  Take 1 1/2 tablets today, tomorrow and Thursday and then continue to take 1 tablet daily except 1/2 tablet on Mondays.  Re-check in 1 week.  Patient has been advised of the risks of a subtherapeutic INR and pt did verbalize understanding.

## 2018-12-23 NOTE — Progress Notes (Signed)
Subjective:    Patient ID: [name], male    DOB: 02-07-1956, 63 y.o.   MRN: [id number]  HPI  Here to f/u recent LLE wound cellulitis after injured by a buckle on a leash attacked to a dog that ran around him; seen per NP s/p antibx course with resolved except for small open wound residual without redness or drainage or fever.  Unfortunately about the start of septra (but not clear if related) developed mucositis like lesions to the lower lip only (no upper lip or mouth ulcers). No fever or trauma.  Pt denies chest pain, increased sob or doe, wheezing, orthopnea, PND, increased LE swelling, palpitations, dizziness or syncope.  Pt denies new neurological symptoms such as new headache, or facial or extremity weakness or numbness   Pt denies polydipsia, polyuria Past Medical History:  Diagnosis Date  . AICD (automatic cardioverter/defibrillator) present   . Anemia    supposed to be taking Vit B but doesn't  . ANXIETY    takes Xanax nightly  . Arthritis   . Asthma    Albuterol prn and Advair daily;also takes Prednisone daily  . Atrial fibrillation ([place]) 09/06/2015  . Cardiomyopathy [facility])    a. EF 25% TEE July 2013; b. EF normalized 2015;  c. 03/2015 Echo: EF 40-45%, difrf HK, PASP 38 mmHg, Mild MR, sev LAE/RAE.  [name] Chronic constipation    takes OTC stool softener  . COPD (chronic obstructive pulmonary disease) ([place])    "one dr says COPD; one dr says emphysema" (09/18/2017)  . DEPRESSION    takes Zoloft and Doxepin daily  . Diverticulitis   . DYSKINESIA, ESOPHAGUS   . Emphysema of lung [facility])    "one dr says COPD; one dr says emphysema" (09/18/2017)  . Essential hypertension       . FIBROMYALGIA   . GERD (gastroesophageal reflux disease)       . Glaucoma   . HYPERLIPIDEMIA    a. Intolerant to statins.  . INSOMNIA    takes Ambien nightly  . Myocardial infarction [facility])    a. 2012 Myoview notable for prior infarct;  b. 03/2015 Lexiscan CL: EF 37%, diff HK, small area of inferior  infarct from apex to base-->Med Rx.  . Myocardial infarction ([place])   . O2 dependent    "2.5L q hs & prn" (09/18/2017)  . Paroxysmal atrial fibrillation ([facility])    a. CHA2DS2VASc = 3--> takes Coumadin;  b. 03/15/2015 Successful TEE/DCCV;  c. 03/2015 recurrent afib, Amio d/c'd in setting of hyperthyroidism.  . Peripheral neuropathy   . Pneumonia 12/2016  . Rash and other nonspecific skin eruption 04/12/2009   no cause found saw dermatologists x 2 and allergist  . SLEEP APNEA, OBSTRUCTIVE    a. doesn't use CPAP  . Syncope    a. 03/2015 s/p MDT LINQ.  [name] Type II diabetes mellitus ([place])        Past Surgical History:  Procedure Laterality Date  . ACNE CYST REMOVAL     2 on back   . AV NODE ABLATION N/A 10/25/2017   Procedure: AV NODE ABLATION;  Surgeon: Deboraha Sprang, MD;  Location: [place] CV LAB;  Service: Cardiovascular;  Laterality: N/A;  . BIV ICD INSERTION CRT-D N/A 09/18/2017   Procedure: BIV ICD INSERTION CRT-D;  Surgeon: Deboraha Sprang, MD;  Location: [place] [facility];  Service: Cardiovascular;  Laterality: N/A;  . CARDIAC CATHETERIZATION N/A 03/21/2016   Procedure: Right/Left Heart Cath and Coronary Angiography;  Surgeon: Larey Dresser, MD;  Location: [place] [facility];  Service: Cardiovascular;  Laterality: N/A;  . CARDIOVERSION  04/18/2012   Procedure: CARDIOVERSION;  Surgeon: Fay Records, MD;  Location: [place];  Service: Cardiovascular;  Laterality: N/A;  . CARDIOVERSION  04/25/2012   Procedure: CARDIOVERSION;  Surgeon: Thayer Headings, MD;  Location: [place];  Service: Cardiovascular;  Laterality: N/A;  . CARDIOVERSION  04/25/2012   Procedure: CARDIOVERSION;  Surgeon: Fay Records, MD;  Location: [place];  Service: Cardiovascular;  Laterality: N/A;  . CARDIOVERSION  05/09/2012   Procedure: CARDIOVERSION;  Surgeon: Sherren Mocha, MD;  Location: [place];  Service: Cardiovascular;  Laterality: N/A;  changed from crenshaw to cooper by trish/leone-endo  . CARDIOVERSION N/A  03/15/2015   Procedure: CARDIOVERSION;  Surgeon: Thayer Headings, MD;  Location: [facility] ENDOSCOPY;  Service: Cardiovascular;  Laterality: N/A;  . COLONOSCOPY    . COLONOSCOPY WITH PROPOFOL N/A 10/21/2014   Procedure: COLONOSCOPY WITH PROPOFOL;  Surgeon: Ladene Artist, MD;  Location: [facility] ENDOSCOPY;  Service: Endoscopy;  Laterality: N/A;  . EP IMPLANTABLE DEVICE N/A 04/06/2015   Procedure: Loop Recorder Insertion;  Surgeon: Evans Lance, MD;  Location: [place] CV LAB;  Service: Cardiovascular;  Laterality: N/A;  . ESOPHAGOGASTRODUODENOSCOPY    . JOINT REPLACEMENT    . LOOP RECORDER REMOVAL N/A 09/18/2017   Procedure: LOOP RECORDER REMOVAL;  Surgeon: Deboraha Sprang, MD;  Location: [place] CV LAB;  Service: Cardiovascular;  Laterality: N/A;  . RIGHT/LEFT HEART CATH AND CORONARY ANGIOGRAPHY N/A 01/28/2017   Procedure: Right/Left Heart Cath and Coronary Angiography;  Surgeon: Larey Dresser, MD;  Location: [place] [facility] LAB;  Service: Cardiovascular;  Laterality: N/A;  . TEE WITHOUT CARDIOVERSION  04/25/2012   Procedure: TRANSESOPHAGEAL ECHOCARDIOGRAM (TEE);  Surgeon: Thayer Headings, MD;  Location: [place];  Service: Cardiovascular;  Laterality: N/A;  . TEE WITHOUT CARDIOVERSION N/A 03/15/2015   Procedure: TRANSESOPHAGEAL ECHOCARDIOGRAM (TEE);  Surgeon: Thayer Headings, MD;  Location: [place];  Service: Cardiovascular;  Laterality: N/A;  . TONSILLECTOMY AND ADENOIDECTOMY    . TOTAL KNEE ARTHROPLASTY Right 06/15/2014   Procedure: TOTAL KNEE ARTHROPLASTY;  Surgeon: Renette Butters, MD;  Location: [place];  Service: Orthopedics;  Laterality: Right;    reports that he quit smoking about 11 years ago. His smoking use included cigarettes. He has a 60.00 pack-year smoking history. He has never used smokeless tobacco. He reports that he does not drink alcohol or use drugs. family history includes Allergies in his mother; Asthma in his maternal grandmother and mother; COPD in his mother; Colon  polyps in his mother; Hypothyroidism in his mother. Allergies  Allergen Reactions  . Amiodarone Other (See Comments)    hyperthyroidism  . Statins Other (See Comments)    myalgia  . Tape Other (See Comments)    Skin Tears Use Paper Tape Only   Current Outpatient Medications on File Prior to Visit  Medication Sig Dispense Refill  . acetaminophen (TYLENOL) 500 MG tablet Take 500 mg by mouth every 6 (six) hours as needed.    [name] albuterol (VENTOLIN HFA) 108 (90 Base) MCG/ACT inhaler INHALE 2 PUFFS 4 TIMES A DAY as needed 54 Inhaler 3  . ALPRAZolam (XANAX) 0.5 MG tablet TAKE 2 TABLETS BY MOUTH AT BEDTIME 60 tablet 2  . carisoprodol (SOMA) 350 MG tablet TAKE 1 TABLET BY MOUTH 3 TIMES A DAY AS NEEDED FOR MUSCLE SPASM 90 tablet 5  . carvedilol (COREG) 12.5 MG  tablet TAKE 1 TABLET (12.5 MG TOTAL) BY MOUTH 2 (TWO) TIMES DAILY WITH A MEAL. 180 tablet 3  . clobetasol ointment (TEMOVATE) 8.10 % Apply 1 application topically 2 (two) times daily. 60 g 1  . digoxin (LANOXIN) 0.25 MG tablet TAKE 0.5 TABLETS (0.125 MG TOTAL) BY MOUTH DAILY. 30 tablet 5  . doxepin (SINEQUAN) 10 MG capsule TAKE 2 CAPSULES BY MOUTH AT BEDTIME. 180 capsule 2  . ezetimibe (ZETIA) 10 MG tablet Take 1 tablet (10 mg total) by mouth daily. 90 tablet 3  . fluticasone (FLONASE) 50 MCG/ACT nasal spray PLACE 2 SPRAYS INTO BOTH NOSTRILS DAILY AS NEEDED FOR ALLERGIES OR RHINITIS. 16 g 0  . Fluticasone-Salmeterol (ADVAIR DISKUS) 250-50 MCG/DOSE AEPB Inhale 1 puff into the lungs 2 (two) times daily. 180 each 3  . hydrocortisone 2.5 % cream APPLY TO AFFECTED AREA TWICE A DAY 28.35 g 5  . hydrOXYzine (ATARAX/VISTARIL) 10 MG tablet TAKE 1 TABLET BY MOUTH THREE TIMES A DAY AS NEEDED 60 tablet 2  . ketoconazole (NIZORAL) 2 % cream APPLY TO AFFECTED AREA EVERY DAY 15 g 0  . lidocaine (LIDODERM) 5 % APPLY 1 PATCH TO AFFECTED AREA FOR UP TO 12 HOURS . DO NOT USE MORE THAN ONE PATCH IN 24 HOURS. 90 patch 1  . methotrexate (RHEUMATREX) 2.5 MG tablet  TAKE 4 TABLETS BY MOUTH EVERY WEEK 48 tablet 1  . omeprazole (PRILOSEC) 20 MG capsule Take 1 capsule (20 mg total) by mouth daily with supper. 90 capsule 1  . omeprazole (PRILOSEC) 20 MG capsule TAKE 1 CAPSULE BY MOUTH TWICE A DAY 180 capsule 2  . sertraline (ZOLOFT) 100 MG tablet TAKE 2 TABLETS BY MOUTH AT BEDTIME 180 tablet 3  . silver sulfADIAZINE (SILVADENE) 1 % cream APPLY TO AFFECTED AREA EVERY DAY 50 g 3  . spironolactone (ALDACTONE) 25 MG tablet TAKE 1 TABLET BY MOUTH EVERY DAY 90 tablet 1  . sulfamethoxazole-trimethoprim (BACTRIM DS,SEPTRA DS) 800-160 MG tablet Take 1 tablet by mouth 2 (two) times daily. 14 tablet 0  . torsemide (DEMADEX) 20 MG tablet Take 40 mg by mouth daily. Take 2 tabs (40 mg) in the morning and 1 tab (20 mg) in the evening.    . traMADol (ULTRAM) 50 MG tablet TAKE 1 TABLET BY MOUTH EVERY 6 HOURS AS NEEDED 30 tablet 2  . traZODone (DESYREL) 100 MG tablet TAKE 1/2 TABLET BY MOUTH EVERY DAY AT BEDTIME AS NEEDED 45 tablet 1  . traZODone (DESYREL) 50 MG tablet Take 1 tablet (50 mg total) by mouth at bedtime as needed for sleep. 90 tablet 1  . TUMS 500 MG chewable tablet Chew 500-2,000 mg by mouth every 4 (four) hours as needed for indigestion or heartburn.     . valACYclovir (VALTREX) 1000 MG tablet TAKE 1 TABLET BY MOUTH THREE TIMES A DAY 21 tablet 2  . venlafaxine XR (EFFEXOR-XR) 37.5 MG 24 hr capsule TAKE ONE CAPSULE BY MOUTH EVERY DAY WITH BREAKFAST 90 capsule 1  . warfarin (COUMADIN) 5 MG tablet Take 1 tablet daily except 1/2 tablet on Monday and Friday or Take as directed by anticoagulation clinic. 90 tablet 1  . zolpidem (AMBIEN) 10 MG tablet TAKE 1 TABLET BY MOUTH EVERYDAY AT BEDTIME 90 tablet 1   No current facility-administered medications on file prior to visit.    Review of Systems  Constitutional: Negative for other unusual diaphoresis or sweats HENT: Negative for ear discharge or swelling Eyes: Negative for other worsening visual  disturbances Respiratory:  Negative for stridor or other swelling  Gastrointestinal: Negative for worsening distension or other blood Genitourinary: Negative for retention or other urinary change Musculoskeletal: Negative for other MSK pain or swelling Skin: Negative for color change or other new lesions Neurological: Negative for worsening tremors and other numbness  Psychiatric/Behavioral: Negative for worsening agitation or other fatigue All other system neg per pt    Objective:   Physical Exam BP 106/82   Pulse 76   Temp 98.1 F (36.7 C) (Oral)   Ht 5\' 10"  (1.778 m)   Wt 221 lb (100.2 kg)   SpO2 97%   BMI 31.71 kg/m  VS noted,  Constitutional: Pt appears in NAD HENT: Head: NCAT.  Right Ear: External ear normal.  Left Ear: External ear normal.  Eyes: . Pupils are equal, round, and reactive to light. Conjunctivae and EOM are normal Nose: without d/c or deformity Neck: Neck supple. Gross normal ROM Cardiovascular: Normal rate and regular rhythm.   Pulmonary/Chest: Effort normal and breath sounds without rales or wheezing.  Abd:  Soft, NT, ND, + BS, no organomegaly Neurological: Pt is alert. At baseline orientation, motor grossly intact Skin: Skin is warm. No rashes, no LE edema, has 1/2 x 1 x 8 mm depth ulceration without redness or drainage, lower lip with partial crusted superficial extensive open lesions without significant lip swelling and mild tender Psychiatric: Pt behavior is normal without agitation  No other exam findings Lab Results  Component Value Date   WBC 7.9 10/13/2018   HGB 14.9 10/13/2018   HCT 45.6 10/13/2018   PLT 200 10/13/2018   GLUCOSE 89 11/24/2018   CHOL 184 10/27/2018   TRIG 108 10/27/2018   HDL 38 (L) 10/27/2018   LDLDIRECT 121 (H) 12/19/2017   LDLCALC 124 (H) 10/27/2018   ALT 24 11/14/2018   AST 25 11/14/2018   NA 139 11/24/2018   K 4.5 11/24/2018   CL 106 11/24/2018   CREATININE 1.32 (H) 11/24/2018   BUN 18 11/24/2018   CO2 26  11/24/2018   TSH 2.31 03/27/2018   PSA 1.81 03/27/2018   INR 1.0 (A) 12/23/2018   HGBA1C 4.9 09/22/2018   MICROALBUR 3.0 (H) 04/18/2017       Assessment & Plan:

## 2018-12-23 NOTE — Assessment & Plan Note (Signed)
Improved, cont to follow without routine wound care

## 2018-12-24 ENCOUNTER — Encounter: Payer: Self-pay | Admitting: Family Medicine

## 2018-12-24 ENCOUNTER — Other Ambulatory Visit: Payer: Self-pay

## 2018-12-24 ENCOUNTER — Ambulatory Visit: Payer: Medicare HMO | Admitting: Family Medicine

## 2018-12-24 DIAGNOSIS — M12811 Other specific arthropathies, not elsewhere classified, right shoulder: Secondary | ICD-10-CM | POA: Diagnosis not present

## 2018-12-24 DIAGNOSIS — M75101 Unspecified rotator cuff tear or rupture of right shoulder, not specified as traumatic: Secondary | ICD-10-CM | POA: Diagnosis not present

## 2018-12-24 DIAGNOSIS — M1712 Unilateral primary osteoarthritis, left knee: Secondary | ICD-10-CM

## 2018-12-24 DIAGNOSIS — M12812 Other specific arthropathies, not elsewhere classified, left shoulder: Secondary | ICD-10-CM | POA: Diagnosis not present

## 2018-12-24 LAB — CUP PACEART INCLINIC DEVICE CHECK
Battery Remaining Longevity: 79 mo
Battery Voltage: 2.99 V
Brady Statistic AP VP Percent: 0 %
Brady Statistic AP VS Percent: 0 %
Brady Statistic AS VP Percent: 0 %
Brady Statistic AS VS Percent: 0 %
Brady Statistic RA Percent Paced: 0 %
Brady Statistic RV Percent Paced: 99.89 %
Date Time Interrogation Session: 20200302215918
HighPow Impedance: 74 Ohm
Implantable Lead Implant Date: 20181205
Implantable Lead Implant Date: 20181205
Implantable Lead Location: 753858
Implantable Lead Location: 753860
Implantable Lead Model: 4398
Implantable Pulse Generator Implant Date: 20181205
Lead Channel Impedance Value: 1007 Ohm
Lead Channel Impedance Value: 1007 Ohm
Lead Channel Impedance Value: 1140 Ohm
Lead Channel Impedance Value: 1178 Ohm
Lead Channel Impedance Value: 230.327
Lead Channel Impedance Value: 269.677
Lead Channel Impedance Value: 269.677
Lead Channel Impedance Value: 306.269
Lead Channel Impedance Value: 306.269
Lead Channel Impedance Value: 361 Ohm
Lead Channel Impedance Value: 4047 Ohm
Lead Channel Impedance Value: 418 Ohm
Lead Channel Impedance Value: 513 Ohm
Lead Channel Impedance Value: 513 Ohm
Lead Channel Impedance Value: 760 Ohm
Lead Channel Impedance Value: 760 Ohm
Lead Channel Impedance Value: 874 Ohm
Lead Channel Impedance Value: 893 Ohm
Lead Channel Pacing Threshold Amplitude: 0.5 V
Lead Channel Pacing Threshold Amplitude: 1.25 V
Lead Channel Pacing Threshold Pulse Width: 0.4 ms
Lead Channel Pacing Threshold Pulse Width: 0.6 ms
Lead Channel Setting Pacing Amplitude: 2 V
Lead Channel Setting Pacing Amplitude: 2.5 V
Lead Channel Setting Pacing Pulse Width: 0.4 ms
Lead Channel Setting Pacing Pulse Width: 0.6 ms
Lead Channel Setting Sensing Sensitivity: 0.3 mV

## 2018-12-24 MED ORDER — CEPHALEXIN 500 MG PO CAPS: 500.0000 mg | ORAL_CAPSULE | Freq: Two times a day (BID) | ORAL | 0 refills | Status: DC

## 2018-12-24 NOTE — Assessment & Plan Note (Signed)
Injection given again today, patient will check diabetes.  Discussed which activities of doing which wants to avoid.  Patient will follow-up in 10-12 weeks

## 2018-12-24 NOTE — Assessment & Plan Note (Signed)
Injected again.  Discussed icing regimen and home exercise, topical anti-inflammatories.  Follow-up again in 10 weeks

## 2018-12-24 NOTE — Assessment & Plan Note (Signed)
Patient given injection.  Discussed icing regimen and home exercise.  Which activities of doing which wants to avoid.  Patient is to increase activity slowly.  Follow-up again in 10 weeks does not want any surgical intervention

## 2018-12-24 NOTE — Progress Notes (Signed)
[name] [address] [place], [place] [address] Phone: [phone] Subjective:     CC: Left shoulder pain  QVZ:[id number]   09/23/2019: Repeat injection given today.  Tolerated procedure well.  Patient has been significantly more active.  Discussed which activities to do which wants to avoid.  Patient will follow-up with me again in 4 to 8 weeks  Update 12/24/2018: [name] is a 63 y.o. male coming in with complaint of left shoulder pain. Patient states that he is having bilateral shoulder pain. Pain in anterior shoulder on the left. Pain on the right shoulder radiates to his back. Patient is also having left knee pain. Would like injections today as they have alleviated his pain previously.  Known rotator cuff arthropathy as well as knee arthritis.  Has been 10 weeks.     Past Medical History:  Diagnosis Date  . AICD (automatic cardioverter/defibrillator) present   . Anemia    supposed to be taking Vit B but doesn't  . ANXIETY    takes Xanax nightly  . Arthritis   . Asthma    Albuterol prn and Advair daily;also takes Prednisone daily  . Atrial fibrillation ([place]) 09/06/2015  . Cardiomyopathy [name])    a. EF 25% TEE July 2013; b. EF normalized 2015;  c. 03/2015 Echo: EF 40-45%, difrf HK, PASP 38 mmHg, Mild MR, sev LAE/RAE.  [name] Chronic constipation    takes OTC stool softener  . COPD (chronic obstructive pulmonary disease) ([place])    "one dr says COPD; one dr says emphysema" (09/18/2017)  . DEPRESSION    takes Zoloft and Doxepin daily  . Diverticulitis   . DYSKINESIA, ESOPHAGUS   . Emphysema of lung [facility])    "one dr says COPD; one dr says emphysema" (09/18/2017)  . Essential hypertension       . FIBROMYALGIA   . GERD (gastroesophageal reflux disease)       . Glaucoma   . HYPERLIPIDEMIA    a. Intolerant to statins.  . INSOMNIA    takes Ambien nightly  . Myocardial infarction [facility])    a. 2012 Myoview notable for prior infarct;  b. 03/2015  Lexiscan CL: EF 37%, diff HK, small area of inferior infarct from apex to base-->Med Rx.  . Myocardial infarction ([place])   . O2 dependent    "2.5L q hs & prn" (09/18/2017)  . Paroxysmal atrial fibrillation ([facility])    a. CHA2DS2VASc = 3--> takes Coumadin;  b. 03/15/2015 Successful TEE/DCCV;  c. 03/2015 recurrent afib, Amio d/c'd in setting of hyperthyroidism.  . Peripheral neuropathy   . Pneumonia 12/2016  . Rash and other nonspecific skin eruption 04/12/2009   no cause found saw dermatologists x 2 and allergist  . SLEEP APNEA, OBSTRUCTIVE    a. doesn't use CPAP  . Syncope    a. 03/2015 s/p MDT LINQ.  [name] Type II diabetes mellitus ([place])        Past Surgical History:  Procedure Laterality Date  . ACNE CYST REMOVAL     2 on back   . AV NODE ABLATION N/A 10/25/2017   Procedure: AV NODE ABLATION;  Surgeon: Deboraha Sprang, MD;  Location: [place] [facility];  Service: Cardiovascular;  Laterality: N/A;  . BIV ICD INSERTION CRT-D N/A 09/18/2017   Procedure: BIV ICD INSERTION CRT-D;  Surgeon: Deboraha Sprang, MD;  Location: [place] [facility];  Service: Cardiovascular;  Laterality: N/A;  . CARDIAC CATHETERIZATION N/A 03/21/2016   Procedure:  Right/Left Heart Cath and Coronary Angiography;  Surgeon: Larey Dresser, MD;  Location: [place] [facility];  Service: Cardiovascular;  Laterality: N/A;  . CARDIOVERSION  04/18/2012   Procedure: CARDIOVERSION;  Surgeon: Fay Records, MD;  Location: [place];  Service: Cardiovascular;  Laterality: N/A;  . CARDIOVERSION  04/25/2012   Procedure: CARDIOVERSION;  Surgeon: Thayer Headings, MD;  Location: [place];  Service: Cardiovascular;  Laterality: N/A;  . CARDIOVERSION  04/25/2012   Procedure: CARDIOVERSION;  Surgeon: Fay Records, MD;  Location: [place];  Service: Cardiovascular;  Laterality: N/A;  . CARDIOVERSION  05/09/2012   Procedure: CARDIOVERSION;  Surgeon: Sherren Mocha, MD;  Location: [place];  Service: Cardiovascular;  Laterality: N/A;  changed from crenshaw to  cooper by trish/leone-endo  . CARDIOVERSION N/A 03/15/2015   Procedure: CARDIOVERSION;  Surgeon: Thayer Headings, MD;  Location: [facility];  Service: Cardiovascular;  Laterality: N/A;  . COLONOSCOPY    . COLONOSCOPY WITH PROPOFOL N/A 10/21/2014   Procedure: COLONOSCOPY WITH PROPOFOL;  Surgeon: Ladene Artist, MD;  Location: [facility]OSCOPY;  Service: Endoscopy;  Laterality: N/A;  . EP IMPLANTABLE DEVICE N/A 04/06/2015   Procedure: Loop Recorder Insertion;  Surgeon: Evans Lance, MD;  Location: [place] [facility];  Service: Cardiovascular;  Laterality: N/A;  . ESOPHAGOGASTRODUODENOSCOPY    . JOINT REPLACEMENT    . LOOP RECORDER REMOVAL N/A 09/18/2017   Procedure: LOOP RECORDER REMOVAL;  Surgeon: Deboraha Sprang, MD;  Location: [place] [facility];  Service: Cardiovascular;  Laterality: N/A;  . RIGHT/LEFT HEART CATH AND CORONARY ANGIOGRAPHY N/A 01/28/2017   Procedure: Right/Left Heart Cath and Coronary Angiography;  Surgeon: Larey Dresser, MD;  Location: [place] [facility];  Service: Cardiovascular;  Laterality: N/A;  . TEE WITHOUT CARDIOVERSION  04/25/2012   Procedure: TRANSESOPHAGEAL ECHOCARDIOGRAM (TEE);  Surgeon: Thayer Headings, MD;  Location: [place];  Service: Cardiovascular;  Laterality: N/A;  . TEE WITHOUT CARDIOVERSION N/A 03/15/2015   Procedure: TRANSESOPHAGEAL ECHOCARDIOGRAM (TEE);  Surgeon: Thayer Headings, MD;  Location: [place];  Service: Cardiovascular;  Laterality: N/A;  . TONSILLECTOMY AND ADENOIDECTOMY    . TOTAL KNEE ARTHROPLASTY Right 06/15/2014   Procedure: TOTAL KNEE ARTHROPLASTY;  Surgeon: Renette Butters, MD;  Location: [place];  Service: Orthopedics;  Laterality: Right;   Social History   Socioeconomic History  . Marital status: Divorced    Spouse name: Not on file  . Number of children: 2  . Years of education: Not on file  . Highest education level: Not on file  Occupational History  . Occupation: retired/disabled. prev worked in Therapist, sports.     Employer: DISABLED  Social Needs  . Financial resource strain: Not on file  . Food insecurity:    Worry: Not on file    Inability: Not on file  . Transportation needs:    Medical: Not on file    Non-medical: Not on file  Tobacco Use  . Smoking status: Former Smoker    Packs/day: 2.00    Years: 30.00    Pack years: 60.00    Types: Cigarettes    Last attempt to quit: 10/16/2007    Years since quitting: 11.1  . Smokeless tobacco: Never Used  Substance and Sexual Activity  . Alcohol use: No  . Drug use: No  . Sexual activity: Not Currently  Lifestyle  . Physical activity:    Days per week: Not on file    Minutes per session: Not on file  .  Stress: Not on file  Relationships  . Social connections:    Talks on phone: Not on file    Gets together: Not on file    Attends religious service: Not on file    Active member of club or organization: Not on file    Attends meetings of clubs or organizations: Not on file    Relationship status: Not on file  Other Topics Concern  . Not on file  Social History Narrative   Lives alone.   Allergies  Allergen Reactions  . Amiodarone Other (See Comments)    hyperthyroidism  . Statins Other (See Comments)    myalgia  . Tape Other (See Comments)    Skin Tears Use Paper Tape Only   Family History  Problem Relation Age of Onset  . COPD Mother   . Asthma Mother   . Colon polyps Mother   . Allergies Mother   . Hypothyroidism Mother   . Asthma Maternal Grandmother   . Colon cancer Neg Hx     Current Outpatient Medications (Endocrine & Metabolic):  .  predniSONE (DELTASONE) 10 MG tablet, 3 tabs by mouth per day for 3 days,2tabs per day for 3 days,1tab per day for 3 days  Current Outpatient Medications (Cardiovascular):  .  carvedilol (COREG) 12.5 MG tablet, TAKE 1 TABLET (12.5 MG TOTAL) BY MOUTH 2 (TWO) TIMES DAILY WITH A MEAL. [name]  digoxin (LANOXIN) 0.25 MG tablet, TAKE 0.5 TABLETS (0.125 MG TOTAL) BY MOUTH DAILY. [name]  ezetimibe (ZETIA)  10 MG tablet, Take 1 tablet (10 mg total) by mouth daily. [name]  spironolactone (ALDACTONE) 25 MG tablet, TAKE 1 TABLET BY MOUTH EVERY DAY .  torsemide (DEMADEX) 20 MG tablet, Take 40 mg by mouth daily. Take 2 tabs (40 mg) in the morning and 1 tab (20 mg) in the evening.  Current Outpatient Medications (Respiratory):  .  albuterol (VENTOLIN HFA) 108 (90 Base) MCG/ACT inhaler, INHALE 2 PUFFS 4 TIMES A DAY as needed .  fluticasone (FLONASE) 50 MCG/ACT nasal spray, PLACE 2 SPRAYS INTO BOTH NOSTRILS DAILY AS NEEDED FOR ALLERGIES OR RHINITIS. [name]  Fluticasone-Salmeterol (ADVAIR DISKUS) 250-50 MCG/DOSE AEPB, Inhale 1 puff into the lungs 2 (two) times daily.  Current Outpatient Medications (Analgesics):  .  acetaminophen (TYLENOL) 500 MG tablet, Take 500 mg by mouth every 6 (six) hours as needed. .  traMADol (ULTRAM) 50 MG tablet, TAKE 1 TABLET BY MOUTH EVERY 6 HOURS AS NEEDED  Current Outpatient Medications (Hematological):  .  warfarin (COUMADIN) 5 MG tablet, Take 1 tablet daily except 1/2 tablet on Monday and Friday or Take as directed by anticoagulation clinic.  Current Outpatient Medications (Other):  [name]  ALPRAZolam (XANAX) 0.5 MG tablet, TAKE 2 TABLETS BY MOUTH AT BEDTIME .  carisoprodol (SOMA) 350 MG tablet, TAKE 1 TABLET BY MOUTH 3 TIMES A DAY AS NEEDED FOR MUSCLE SPASM .  clobetasol ointment (TEMOVATE) 0.17 %, Apply 1 application topically 2 (two) times daily. [name]  doxepin (SINEQUAN) 10 MG capsule, TAKE 2 CAPSULES BY MOUTH AT BEDTIME. .  hydrocortisone 2.5 % cream, APPLY TO AFFECTED AREA TWICE A DAY .  hydrOXYzine (ATARAX/VISTARIL) 10 MG tablet, TAKE 1 TABLET BY MOUTH THREE TIMES A DAY AS NEEDED .  ketoconazole (NIZORAL) 2 % cream, APPLY TO AFFECTED AREA EVERY DAY .  lidocaine (LIDODERM) 5 %, APPLY 1 PATCH TO AFFECTED AREA FOR UP TO 12 HOURS . DO NOT USE MORE THAN ONE PATCH IN 24 HOURS. .  methotrexate (RHEUMATREX) 2.5 MG tablet,  TAKE 4 TABLETS BY MOUTH EVERY WEEK .  omeprazole (PRILOSEC) 20 MG  capsule, Take 1 capsule (20 mg total) by mouth daily with supper. [name]  omeprazole (PRILOSEC) 20 MG capsule, TAKE 1 CAPSULE BY MOUTH TWICE A DAY .  sertraline (ZOLOFT) 100 MG tablet, TAKE 2 TABLETS BY MOUTH AT BEDTIME .  silver sulfADIAZINE (SILVADENE) 1 % cream, APPLY TO AFFECTED AREA EVERY DAY .  sulfamethoxazole-trimethoprim (BACTRIM DS,SEPTRA DS) 800-160 MG tablet, Take 1 tablet by mouth 2 (two) times daily. .  traZODone (DESYREL) 100 MG tablet, TAKE 1/2 TABLET BY MOUTH EVERY DAY AT BEDTIME AS NEEDED .  traZODone (DESYREL) 50 MG tablet, Take 1 tablet (50 mg total) by mouth at bedtime as needed for sleep. [name]  triamcinolone (KENALOG) 0.1 % paste, Use as directed 1 application in the mouth or throat 2 (two) times daily. .  TUMS 500 MG chewable tablet, Chew 500-2,000 mg by mouth every 4 (four) hours as needed for indigestion or heartburn.  .  valACYclovir (VALTREX) 1000 MG tablet, TAKE 1 TABLET BY MOUTH THREE TIMES A DAY .  venlafaxine XR (EFFEXOR-XR) 37.5 MG 24 hr capsule, TAKE ONE CAPSULE BY MOUTH EVERY DAY WITH BREAKFAST .  zolpidem (AMBIEN) 10 MG tablet, TAKE 1 TABLET BY MOUTH EVERYDAY AT BEDTIME .  cephALEXin (KEFLEX) 500 MG capsule, Take 1 capsule (500 mg total) by mouth 2 (two) times daily.    Past medical history, social, surgical and family history all reviewed in electronic medical record.  No pertanent information unless stated regarding to the chief complaint.   Review of Systems:  No headache, visual changes, nausea, vomiting, diarrhea, constipation, dizziness, abdominal pain, skin rash, fevers, chills, night sweats, weight loss, swollen lymph nodes, body aches, joint swelling, , chest pain, shortness of breath, mood changes.  Positive muscle aches  Objective  Blood pressure 118/86, pulse 73, height 5\' 10"  (1.778 m), weight 221 lb (100.2 kg), SpO2 98 %.    General: No apparent distress alert and oriented x3 mood and affect normal, dressed appropriately.  HEENT: Pupils equal,  extraocular movements intact  Respiratory: Patient's speak in full sentences and does not appear short of breath  Cardiovascular: No lower extremity edema, non tender, no erythema  Skin: Resolving cellulitis noted of the left lower extremity Abdomen: Soft nontender  Neuro: Cranial nerves II through XII are intact, neurovascularly intact in all extremities with 2+ DTRs and 2+ pulses.  Lymph: No lymphadenopathy of posterior or anterior cervical chain or axillae bilaterally.  Gait antalgic MSK: Significant arthritic changes of multiple joints Bilateral shoulders show atrophy of the musculature.  Patient does have crepitus left greater than right.  Patient has 3+ out of 5 strength of the rotator cuff bilaterally.  Lacks last 5 degrees of rotation, external and internal rotation of the shoulders bilaterally.  Knee: Left valgus deformity noted.  Abnormal thigh to calf ratio.  Tender to palpation over medial and PF joint line.  ROM full in flexion and extension and lower leg rotation. instability with valgus force.  painful patellar compression. Patellar glide with moderate crepitus. Patellar and quadriceps tendons unremarkable. Hamstring and quadriceps strength is normal. Contralateral knee shows relative changes as well  After informed written and verbal consent, patient was seated on exam table. Right shoulder was prepped with alcohol swab and utilizing posterior approach, patient's right glenohumeral space was injected with 4:1  marcaine 0.5%: Kenalog 40mg /dL. Patient tolerated the procedure well without immediate complications.  After informed written and verbal consent, patient was seated  on exam table. Left shoulder was prepped with alcohol swab and utilizing posterior approach, patient's right glenohumeral space was injected with 4:1  marcaine 0.5%: Kenalog 40mg /dL. Patient tolerated the procedure well without immediate complications.  After informed written and verbal consent, patient was  seated on exam table. Left knee was prepped with alcohol swab and utilizing anterolateral approach, patient's left knee space was injected with 4:1  marcaine 0.5%: Kenalog 40mg /dL. Patient tolerated the procedure well without immediate complications.    Impression and Recommendations:     This case required medical decision making of moderate complexity. The above documentation has been reviewed and is accurate and complete Zachary Pulley, DO       Note: This dictation was prepared with Dragon dictation along with smaller phrase technology. Any transcriptional errors that result from this process are unintentional.

## 2018-12-24 NOTE — Patient Instructions (Signed)
Great to see you  You know the drill  Tuned you up  Ice is your friend pennsaid pinkie amount topically 2 times daily as needed.  See me again in 10 weeks!

## 2018-12-28 ENCOUNTER — Other Ambulatory Visit: Payer: Self-pay | Admitting: Internal Medicine

## 2018-12-29 ENCOUNTER — Encounter: Payer: Self-pay | Admitting: Cardiology

## 2018-12-29 ENCOUNTER — Ambulatory Visit (INDEPENDENT_AMBULATORY_CARE_PROVIDER_SITE_OTHER): Payer: Medicare HMO

## 2018-12-29 DIAGNOSIS — Z95810 Presence of automatic (implantable) cardiac defibrillator: Secondary | ICD-10-CM

## 2018-12-29 DIAGNOSIS — I5022 Chronic systolic (congestive) heart failure: Secondary | ICD-10-CM

## 2018-12-29 NOTE — Telephone Encounter (Signed)
Done erx 

## 2018-12-29 NOTE — Progress Notes (Signed)
Remote ICD transmission.   

## 2018-12-30 ENCOUNTER — Telehealth: Payer: Self-pay | Admitting: Internal Medicine

## 2018-12-30 ENCOUNTER — Telehealth: Payer: Self-pay

## 2018-12-30 NOTE — Telephone Encounter (Signed)
Remote ICM transmission received.  Attempted call to patient regarding ICM remote transmission and left detailed message, per DPR, to return call.    

## 2018-12-30 NOTE — Progress Notes (Signed)
EPIC Encounter for ICM Monitoring  Patient Name: [name] is a 63 y.o. male Date: 12/30/2018 Primary Care Physican: [name], MD Primary Cardiologist:Mc[facility] Electrophysiologist:Klein Bi-V Pacing:100% Last Weight: 225 lbs Today's Weight:unknown   Attempted call to patient and unable to reach.  Left detailed message to return call regarding transmission. Transmission reviewed.    Thoracic impedanceabnormalsuggesting fluid accumulation starting 11/13/2018.   Prescribed:Torsemide 20 mg to 2 tablets (40 mg total)every AM and 1 tablet (20 mg total) every PM.  Labs: 11/24/2018 Creatinine 1.32, BUN 18, Potassium 4.5, Sodium 139, GFR 57->60 01/13/2020Creatinine 1.17, BUN22, Potassium3.9, Sodium139, GFR>60 10/13/2018 Creatinine0.98, BUN17, Potassium4.5, Sodium141, GFR>60 07/14/2018 Creatinine1.32, BUN21, Potassium4.6, Sodium136, GFR56->60 03/27/2018 Creatinine1.09, BUN14, Potassium3.7, Sodium138, GFR>60  03/31/2018 Creatinine1.01, BUN19, Potassium4.4, Sodium138, GFR>60  03/27/2018 Creatinine0.94, BUN16, Potassium5.0, Sodium141  03/18/2018 Creatinine0.94, BUN26, Potassium4.6, Sodium140 A complete set of results can be found in Results Review.  Recommendations:Left voice mail with ICM number and encouraged to call back.  Follow-up plan: ICM clinic phone appointment on3/25/2020 to recheck fluid levels.Office appointment scheduled with HF clinic 01/09/2019.  Copy of ICM check sent to Dr.Klein and Dr [name] for review and recommendations if needed.   3 month ICM trend: 12/30/2018    1 Year ICM trend:       [name], RN 12/30/2018 8:56 AM

## 2018-12-30 NOTE — Telephone Encounter (Signed)
Copied from [place] [id number]. Topic: Quick Communication - Rx Refill/Question >> Dec 30, 2018  3:26 PM Reyne Dumas L wrote: Medication: lidocaine (LIDODERM) 5 %  Has the patient contacted their pharmacy? Yes - no refills left (Agent: If no, request that the patient contact the pharmacy for the refill.) (Agent: If yes, when and what did the pharmacy advise?)  Preferred Pharmacy (with phone number or street name): [place], [place] [phone] (Phone) [phone] (Fax)  Agent: Please be advised that RX refills may take up to 3 business days. We ask that you follow-up with your pharmacy.

## 2018-12-30 NOTE — Progress Notes (Signed)
Discussed with Dr [name] in the office.  He recommended if patient is reached and symptomatic to Torsemide 20 mg to 3 tablets (60 mg total) every other day to see if it more effective than current dosage.

## 2018-12-31 ENCOUNTER — Telehealth (HOSPITAL_COMMUNITY): Payer: Self-pay

## 2018-12-31 MED ORDER — LIDOCAINE 5 % EX PTCH: MEDICATED_PATCH | CUTANEOUS | 1 refills | Status: DC

## 2018-12-31 NOTE — Telephone Encounter (Signed)
Eft VM regarding 3/27 appt to reschedule

## 2018-12-31 NOTE — Progress Notes (Signed)
Received call from patient.  He reported he is feeling well and denied any fluid symptoms.  Reviewed HF symptoms and encouraged to call back if he develops symptoms.  Weight is stable at 225 lbs.  He is limiting salt and fluids.  He has completed [employer] health class taught by Vanita Ingles that reviews nutrition including limiting salt and reading labels.  Advised will recheck fluid levels next week before he visits Dr Haroldine Laws on 3/27.  Encouraged him to call back if he has any fluid symptoms.  Since patient is asymptomatic, did not need to implement Dr Olin Pia recommendations but will do so if he becomes symptomatic.

## 2019-01-01 ENCOUNTER — Telehealth: Payer: Self-pay

## 2019-01-01 NOTE — Telephone Encounter (Signed)
Key: [id number]

## 2019-01-01 NOTE — Telephone Encounter (Signed)
Approved through 10/15/2019  Determination sent to scan.

## 2019-01-07 ENCOUNTER — Ambulatory Visit (INDEPENDENT_AMBULATORY_CARE_PROVIDER_SITE_OTHER): Payer: Medicare HMO

## 2019-01-07 ENCOUNTER — Other Ambulatory Visit: Payer: Self-pay

## 2019-01-07 DIAGNOSIS — I5022 Chronic systolic (congestive) heart failure: Secondary | ICD-10-CM

## 2019-01-07 DIAGNOSIS — Z95810 Presence of automatic (implantable) cardiac defibrillator: Secondary | ICD-10-CM

## 2019-01-09 ENCOUNTER — Other Ambulatory Visit: Payer: Self-pay | Admitting: Internal Medicine

## 2019-01-09 ENCOUNTER — Encounter (HOSPITAL_COMMUNITY): Payer: Medicare HMO | Admitting: Cardiology

## 2019-01-09 DIAGNOSIS — L308 Other specified dermatitis: Secondary | ICD-10-CM

## 2019-01-09 NOTE — Progress Notes (Signed)
EPIC Encounter for ICM Monitoring  Patient Name: [name] is a 63 y.o. male Date: 01/09/2019 Primary Care Physican: [name], MD Primary Cardiologist:McLean Electrophysiologist:Klein Bi-V Pacing:100% Last Weight: 225 lbs 01/09/2019 Weight:227 lbs   Heart failure questions reviewed.  He is asymptomatic.   Thoracic impedancereturned to normalsince 3/16/20220 remote transmission.   Prescribed:Torsemide 20 mg to 2 tablets (40 mg total)every AM and 1 tablet (20 mg total) every PM.  Labs: 11/24/2018 Creatinine 1.32, BUN 18, Potassium 4.5, Sodium 139, GFR 57->60 01/13/2020Creatinine 1.17, BUN22, Potassium3.9, Sodium139, GFR>60 10/13/2018 Creatinine0.98, BUN17, Potassium4.5, Sodium141, GFR>60 07/14/2018 Creatinine1.32, BUN21, Potassium4.6, Sodium136, GFR56->60 03/27/2018 Creatinine1.09, BUN14, Potassium3.7, Sodium138, GFR>60  03/31/2018 Creatinine1.01, BUN19, Potassium4.4, Sodium138, GFR>60  03/27/2018 Creatinine0.94, BUN16, Potassium5.0, Sodium141  03/18/2018 Creatinine0.94, BUN26, Potassium4.6, Sodium140 A complete set of results can be found in Results Review.  Recommendations: Advised to call for any fluid symptoms.  He asked who should he call if he develops COVID 19 symptoms and advised to contact PCP or ER.   Follow-up plan: ICM clinic phone appointment on4/20/2020.  Copy of ICM check sent to [facility]  3 month ICM trend: 01/07/2019    1 Year ICM trend:       [name], RN 01/09/2019 1:33 PM

## 2019-02-02 ENCOUNTER — Other Ambulatory Visit: Payer: Self-pay | Admitting: Internal Medicine

## 2019-02-02 ENCOUNTER — Ambulatory Visit (INDEPENDENT_AMBULATORY_CARE_PROVIDER_SITE_OTHER): Payer: Medicare HMO

## 2019-02-02 ENCOUNTER — Other Ambulatory Visit: Payer: Self-pay

## 2019-02-02 DIAGNOSIS — Z95810 Presence of automatic (implantable) cardiac defibrillator: Secondary | ICD-10-CM | POA: Diagnosis not present

## 2019-02-02 DIAGNOSIS — I5022 Chronic systolic (congestive) heart failure: Secondary | ICD-10-CM | POA: Diagnosis not present

## 2019-02-04 NOTE — Progress Notes (Signed)
EPIC Encounter for ICM Monitoring  Patient Name: KHANG HANNUM is a 63 y.o. male Date: 02/04/2019 Primary Care Physican: [name], MD Primary Cardiologist:McLean Electrophysiologist:Klein Bi-V Pacing:100% Last Weight: 225 lbs 01/09/2019 Weight:227 lbs 02/04/2019 Weight: 214 lbs   Heart failure questions reviewed.  He is asymptomatic but did think his urine was a little darker than usual. He says he drinks close to 64 oz daily. After discussing with patient he reports he has been taking Torsemide 40 mg twice a day instead of the prescribed 40 mg in AM and 20 mg in PM.  Advised to take dosage prescribed and he verbalized understanding.   He is working on weight loss.   Thoracic impedancereturned to normalsince 3/16/20220 remote transmission.   Prescribed:Torsemide 20 mg to 2 tablets (40 mg total)every AM and 1 tablet (20 mg total) every PM.  Labs: 11/24/2018 Creatinine 1.32, BUN 18, Potassium 4.5, Sodium 139, GFR 57->60 01/13/2020Creatinine 1.17, BUN22, Potassium3.9, Sodium139, GFR>60 10/13/2018 Creatinine0.98, BUN17, Potassium4.5, Sodium141, GFR>60 07/14/2018 Creatinine1.32, BUN21, Potassium4.6, Sodium136, GFR56->60 A complete set of results can be found in Results Review.  Recommendations: Advised to call for changes in condition or any fluid symptoms.     Follow-up plan: ICM clinic phone appointment on6/12/2018.  Copy of ICM check sent to [place]  3 month ICM trend: 02/02/2019    1 Year ICM trend:       [name], RN 02/04/2019 12:52 PM

## 2019-02-10 ENCOUNTER — Telehealth (HOSPITAL_COMMUNITY): Payer: Self-pay | Admitting: Licensed Clinical Social Worker

## 2019-02-10 NOTE — Telephone Encounter (Addendum)
CSW received notification from [employer] that patient is now eligible to receive Entresto through their foundation.   Pt ID: [id number] Expires: 10/15/2019   Patient had been informed he was not eligible for [employer] back in January so we applied for a copay grant which was approved- pt still has $2,400 on that grant which does not expire until 10/13/19.  CSW called pt to inform of approval as of 01/30/2019  Jorge Ny, [facility] Desk#: [phone] Cell#: [phone]

## 2019-02-11 ENCOUNTER — Other Ambulatory Visit: Payer: Self-pay

## 2019-02-11 MED ORDER — VENLAFAXINE HCL ER 37.5 MG PO CP24: ORAL_CAPSULE | ORAL | 1 refills | Status: DC

## 2019-02-22 ENCOUNTER — Other Ambulatory Visit: Payer: Self-pay | Admitting: Internal Medicine

## 2019-02-22 DIAGNOSIS — I428 Other cardiomyopathies: Secondary | ICD-10-CM

## 2019-02-22 DIAGNOSIS — I255 Ischemic cardiomyopathy: Secondary | ICD-10-CM

## 2019-02-22 DIAGNOSIS — I4819 Other persistent atrial fibrillation: Secondary | ICD-10-CM

## 2019-02-23 NOTE — Telephone Encounter (Signed)
Done erx 

## 2019-03-04 ENCOUNTER — Ambulatory Visit (INDEPENDENT_AMBULATORY_CARE_PROVIDER_SITE_OTHER): Payer: Medicare HMO | Admitting: Family Medicine

## 2019-03-04 ENCOUNTER — Other Ambulatory Visit: Payer: Self-pay

## 2019-03-04 ENCOUNTER — Ambulatory Visit: Payer: Self-pay

## 2019-03-04 ENCOUNTER — Encounter: Payer: Self-pay | Admitting: Family Medicine

## 2019-03-04 VITALS — BP 114/70 | HR 75 | Ht 70.0 in | Wt 222.0 lb

## 2019-03-04 DIAGNOSIS — M75101 Unspecified rotator cuff tear or rupture of right shoulder, not specified as traumatic: Secondary | ICD-10-CM

## 2019-03-04 DIAGNOSIS — M1712 Unilateral primary osteoarthritis, left knee: Secondary | ICD-10-CM | POA: Diagnosis not present

## 2019-03-04 DIAGNOSIS — M12811 Other specific arthropathies, not elsewhere classified, right shoulder: Secondary | ICD-10-CM

## 2019-03-04 DIAGNOSIS — M25512 Pain in left shoulder: Secondary | ICD-10-CM

## 2019-03-04 DIAGNOSIS — L308 Other specified dermatitis: Secondary | ICD-10-CM | POA: Diagnosis not present

## 2019-03-04 DIAGNOSIS — G8929 Other chronic pain: Secondary | ICD-10-CM | POA: Diagnosis not present

## 2019-03-04 NOTE — Assessment & Plan Note (Signed)
Repeat injection given today.  Tolerated the procedure well.  Discussed icing regimen and home exercises as well.  Patient does not want any surgical intervention.  Medications he is on at this point seems to be tolerating the day-to-day.

## 2019-03-04 NOTE — Progress Notes (Signed)
[name] [address] [place], [place] [address] Phone: [phone] Subjective:   I [name] am serving as a Education administrator for Dr. Hulan Saas.    CC: shoulder and knee pain   XFG:[id number]  [name] is a 63 y.o. male coming in with complaint of left knee and shoulder pain. States that his pain comes and goes. Would like an injection. Arthritic changes of the joints.  Has responded well to injections.  Has been quite some time.  Patient still is working on weight loss Patient has responded well to injections previously.  Patient states as long as he has this he is able to stay active.  Patient has been losing weight and is getting ready for his wedding in June     Past Medical History:  Diagnosis Date  . AICD (automatic cardioverter/defibrillator) present   . Anemia    supposed to be taking Vit B but doesn't  . ANXIETY    takes Xanax nightly  . Arthritis   . Asthma    Albuterol prn and Advair daily;also takes Prednisone daily  . Atrial fibrillation ([place]) 09/06/2015  . Cardiomyopathy [facility])    a. EF 25% TEE July 2013; b. EF normalized 2015;  c. 03/2015 Echo: EF 40-45%, difrf HK, PASP 38 mmHg, Mild MR, sev LAE/RAE.  [name] Chronic constipation    takes OTC stool softener  . COPD (chronic obstructive pulmonary disease) ([place])    "one dr says COPD; one dr says emphysema" (09/18/2017)  . DEPRESSION    takes Zoloft and Doxepin daily  . Diverticulitis   . DYSKINESIA, ESOPHAGUS   . Emphysema of lung [facility])    "one dr says COPD; one dr says emphysema" (09/18/2017)  . Essential hypertension       . FIBROMYALGIA   . GERD (gastroesophageal reflux disease)       . Glaucoma   . HYPERLIPIDEMIA    a. Intolerant to statins.  . INSOMNIA    takes Ambien nightly  . Myocardial infarction [facility])    a. 2012 Myoview notable for prior infarct;  b. 03/2015 Lexiscan CL: EF 37%, diff HK, small area of inferior infarct from apex to base-->Med Rx.  . Myocardial  infarction ([place])   . O2 dependent    "2.5L q hs & prn" (09/18/2017)  . Paroxysmal atrial fibrillation ([facility])    a. CHA2DS2VASc = 3--> takes Coumadin;  b. 03/15/2015 Successful TEE/DCCV;  c. 03/2015 recurrent afib, Amio d/c'd in setting of hyperthyroidism.  . Peripheral neuropathy   . Pneumonia 12/2016  . Rash and other nonspecific skin eruption 04/12/2009   no cause found saw dermatologists x 2 and allergist  . SLEEP APNEA, OBSTRUCTIVE    a. doesn't use CPAP  . Syncope    a. 03/2015 s/p MDT LINQ.  [name] Type II diabetes mellitus ([place])        Past Surgical History:  Procedure Laterality Date  . ACNE CYST REMOVAL     2 on back   . AV NODE ABLATION N/A 10/25/2017   Procedure: AV NODE ABLATION;  Surgeon: Deboraha Sprang, MD;  Location: [place] CV LAB;  Service: Cardiovascular;  Laterality: N/A;  . BIV ICD INSERTION CRT-D N/A 09/18/2017   Procedure: BIV ICD INSERTION CRT-D;  Surgeon: Deboraha Sprang, MD;  Location: [place] [facility];  Service: Cardiovascular;  Laterality: N/A;  . CARDIAC CATHETERIZATION N/A 03/21/2016   Procedure: Right/Left Heart Cath and Coronary Angiography;  Surgeon: Elby Showers  [name], MD;  Location: [place] CV LAB;  Service: Cardiovascular;  Laterality: N/A;  . CARDIOVERSION  04/18/2012   Procedure: CARDIOVERSION;  Surgeon: Fay Records, MD;  Location: [place];  Service: Cardiovascular;  Laterality: N/A;  . CARDIOVERSION  04/25/2012   Procedure: CARDIOVERSION;  Surgeon: Thayer Headings, MD;  Location: [place];  Service: Cardiovascular;  Laterality: N/A;  . CARDIOVERSION  04/25/2012   Procedure: CARDIOVERSION;  Surgeon: Fay Records, MD;  Location: [place];  Service: Cardiovascular;  Laterality: N/A;  . CARDIOVERSION  05/09/2012   Procedure: CARDIOVERSION;  Surgeon: Sherren Mocha, MD;  Location: [place];  Service: Cardiovascular;  Laterality: N/A;  changed from crenshaw to cooper by trish/leone-endo  . CARDIOVERSION N/A 03/15/2015   Procedure: CARDIOVERSION;  Surgeon: Thayer Headings, MD;  Location: [facility] ENDOSCOPY;  Service: Cardiovascular;  Laterality: N/A;  . COLONOSCOPY    . COLONOSCOPY WITH PROPOFOL N/A 10/21/2014   Procedure: COLONOSCOPY WITH PROPOFOL;  Surgeon: Ladene Artist, MD;  Location: [facility] ENDOSCOPY;  Service: Endoscopy;  Laterality: N/A;  . EP IMPLANTABLE DEVICE N/A 04/06/2015   Procedure: Loop Recorder Insertion;  Surgeon: Evans Lance, MD;  Location: [place] CV LAB;  Service: Cardiovascular;  Laterality: N/A;  . ESOPHAGOGASTRODUODENOSCOPY    . JOINT REPLACEMENT    . LOOP RECORDER REMOVAL N/A 09/18/2017   Procedure: LOOP RECORDER REMOVAL;  Surgeon: Deboraha Sprang, MD;  Location: [place] CV LAB;  Service: Cardiovascular;  Laterality: N/A;  . RIGHT/LEFT HEART CATH AND CORONARY ANGIOGRAPHY N/A 01/28/2017   Procedure: Right/Left Heart Cath and Coronary Angiography;  Surgeon: Larey Dresser, MD;  Location: [place] CV LAB;  Service: Cardiovascular;  Laterality: N/A;  . TEE WITHOUT CARDIOVERSION  04/25/2012   Procedure: TRANSESOPHAGEAL ECHOCARDIOGRAM (TEE);  Surgeon: Thayer Headings, MD;  Location: [place];  Service: Cardiovascular;  Laterality: N/A;  . TEE WITHOUT CARDIOVERSION N/A 03/15/2015   Procedure: TRANSESOPHAGEAL ECHOCARDIOGRAM (TEE);  Surgeon: Thayer Headings, MD;  Location: [place];  Service: Cardiovascular;  Laterality: N/A;  . TONSILLECTOMY AND ADENOIDECTOMY    . TOTAL KNEE ARTHROPLASTY Right 06/15/2014   Procedure: TOTAL KNEE ARTHROPLASTY;  Surgeon: Renette Butters, MD;  Location: [place];  Service: Orthopedics;  Laterality: Right;   Social History   Socioeconomic History  . Marital status: Divorced    Spouse name: Not on file  . Number of children: 2  . Years of education: Not on file  . Highest education level: Not on file  Occupational History  . Occupation: retired/disabled. prev worked in Therapist, sports.    Employer: DISABLED  Social Needs  . Financial resource strain: Not on file  . Food insecurity:    Worry:  Not on file    Inability: Not on file  . Transportation needs:    Medical: Not on file    Non-medical: Not on file  Tobacco Use  . Smoking status: Former Smoker    Packs/day: 2.00    Years: 30.00    Pack years: 60.00    Types: Cigarettes    Last attempt to quit: 10/16/2007    Years since quitting: 11.3  . Smokeless tobacco: Never Used  Substance and Sexual Activity  . Alcohol use: No  . Drug use: No  . Sexual activity: Not Currently  Lifestyle  . Physical activity:    Days per week: Not on file    Minutes per session: Not on file  . Stress: Not on file  Relationships  . Social  connections:    Talks on phone: Not on file    Gets together: Not on file    Attends religious service: Not on file    Active member of club or organization: Not on file    Attends meetings of clubs or organizations: Not on file    Relationship status: Not on file  Other Topics Concern  . Not on file  Social History Narrative   Lives alone.   Allergies  Allergen Reactions  . Amiodarone Other (See Comments)    hyperthyroidism  . Statins Other (See Comments)    myalgia  . Tape Other (See Comments)    Skin Tears Use Paper Tape Only   Family History  Problem Relation Age of Onset  . COPD Mother   . Asthma Mother   . Colon polyps Mother   . Allergies Mother   . Hypothyroidism Mother   . Asthma Maternal Grandmother   . Colon cancer Neg Hx     Current Outpatient Medications (Endocrine & Metabolic):  .  predniSONE (DELTASONE) 10 MG tablet, 3 tabs by mouth per day for 3 days,2tabs per day for 3 days,1tab per day for 3 days  Current Outpatient Medications (Cardiovascular):  .  carvedilol (COREG) 12.5 MG tablet, TAKE 1 TABLET (12.5 MG TOTAL) BY MOUTH 2 (TWO) TIMES DAILY WITH A MEAL. [name]  digoxin (LANOXIN) 0.25 MG tablet, TAKE 0.5 TABLETS (0.125 MG TOTAL) BY MOUTH DAILY. [name]  ezetimibe (ZETIA) 10 MG tablet, Take 1 tablet (10 mg total) by mouth daily. [name]  spironolactone (ALDACTONE) 25 MG tablet, TAKE  1 TABLET BY MOUTH EVERY DAY .  torsemide (DEMADEX) 20 MG tablet, Take 40 mg by mouth daily. Take 2 tabs (40 mg) in the morning and 1 tab (20 mg) in the evening.  Current Outpatient Medications (Respiratory):  .  albuterol (VENTOLIN HFA) 108 (90 Base) MCG/ACT inhaler, INHALE 2 PUFFS 4 TIMES A DAY as needed .  fluticasone (FLONASE) 50 MCG/ACT nasal spray, PLACE 2 SPRAYS INTO BOTH NOSTRILS DAILY AS NEEDED FOR ALLERGIES OR RHINITIS. [name]  Fluticasone-Salmeterol (ADVAIR DISKUS) 250-50 MCG/DOSE AEPB, Inhale 1 puff into the lungs 2 (two) times daily.  Current Outpatient Medications (Analgesics):  .  acetaminophen (TYLENOL) 500 MG tablet, Take 500 mg by mouth every 6 (six) hours as needed. .  traMADol (ULTRAM) 50 MG tablet, TAKE 1 TABLET BY MOUTH EVERY 6 HOURS AS NEEDED  Current Outpatient Medications (Hematological):  .  warfarin (COUMADIN) 5 MG tablet, Take 1 tablet daily except 1/2 tablet on Monday and Friday or Take as directed by anticoagulation clinic.  Current Outpatient Medications (Other):  [name]  ALPRAZolam (XANAX) 0.5 MG tablet, TAKE 2 TABLETS BY MOUTH AT BEDTIME .  carisoprodol (SOMA) 350 MG tablet, TAKE 1 TABLET BY MOUTH 3 TIMES A DAY AS NEEDED FOR MUSCLE SPASM .  cephALEXin (KEFLEX) 500 MG capsule, Take 1 capsule (500 mg total) by mouth 2 (two) times daily. .  clobetasol ointment (TEMOVATE) 1.85 %, Apply 1 application topically 2 (two) times daily. [name]  doxepin (SINEQUAN) 10 MG capsule, Take 2 capsules (20 mg total) by mouth at bedtime. Annual appt due in June must see provider for future refills .  hydrocortisone 2.5 % cream, APPLY TO AFFECTED AREA TWICE A DAY .  hydrOXYzine (ATARAX/VISTARIL) 10 MG tablet, TAKE 1 TABLET BY MOUTH THREE TIMES A DAY AS NEEDED .  ketoconazole (NIZORAL) 2 % cream, APPLY TO AFFECTED AREA EVERY DAY .  lidocaine (LIDODERM) 5 %, APPLY 1 PATCH TO AFFECTED  AREA FOR UP TO 12 HOURS . DO NOT USE MORE THAN ONE PATCH IN 24 HOURS. .  methotrexate (RHEUMATREX) 2.5 MG tablet,  TAKE 4 TABLETS BY MOUTH EVERY WEEK .  omeprazole (PRILOSEC) 20 MG capsule, Take 1 capsule (20 mg total) by mouth daily with supper. [name]  omeprazole (PRILOSEC) 20 MG capsule, TAKE 1 CAPSULE BY MOUTH TWICE A DAY .  sertraline (ZOLOFT) 100 MG tablet, TAKE 2 TABLETS BY MOUTH AT BEDTIME .  silver sulfADIAZINE (SILVADENE) 1 % cream, APPLY TO AFFECTED AREA EVERY DAY .  sulfamethoxazole-trimethoprim (BACTRIM DS,SEPTRA DS) 800-160 MG tablet, Take 1 tablet by mouth 2 (two) times daily. .  traZODone (DESYREL) 100 MG tablet, TAKE 1/2 TABLET BY MOUTH EVERY DAY AT BEDTIME AS NEEDED .  traZODone (DESYREL) 50 MG tablet, Take 1 tablet (50 mg total) by mouth at bedtime as needed for sleep. [name]  triamcinolone (KENALOG) 0.1 % paste, Use as directed 1 application in the mouth or throat 2 (two) times daily. .  TUMS 500 MG chewable tablet, Chew 500-2,000 mg by mouth every 4 (four) hours as needed for indigestion or heartburn.  .  valACYclovir (VALTREX) 1000 MG tablet, TAKE 1 TABLET BY MOUTH THREE TIMES A DAY .  venlafaxine XR (EFFEXOR-XR) 37.5 MG 24 hr capsule, TAKE ONE CAPSULE BY MOUTH EVERY DAY WITH BREAKFAST .  zolpidem (AMBIEN) 10 MG tablet, TAKE 1 TABLET BY MOUTH EVERYDAY AT BEDTIME    Past medical history, social, surgical and family history all reviewed in electronic medical record.  No pertanent information unless stated regarding to the chief complaint.   Review of Systems:  No headache, visual changes, nausea, vomiting, diarrhea, constipation, dizziness, abdominal pain, skin rash, fevers, chills, night sweats, weight loss, swollen lymph nodes, body aches, joint swelling,  chest pain, shortness of breath, mood changes.  Positive muscle aches  Objective  Blood pressure 114/70, pulse 75, height 5\' 10"  (1.778 m), weight 222 lb (100.7 kg), SpO2 94 %.    General: No apparent distress alert and oriented x3 mood and affect normal, dressed appropriately.  HEENT: Pupils equal, extraocular movements intact   Respiratory: Patient's speak in full sentences and does not appear short of breath  Cardiovascular: No lower extremity edema, non tender, no erythema  Skin: Warm dry intact with no signs of infection or rash on extremities or on axial skeleton.  Abdomen: Soft nontender  Neuro: Cranial nerves II through XII are intact, neurovascularly intact in all extremities with 2+ DTRs and 2+ pulses.  Lymph: No lymphadenopathy of posterior or anterior cervical chain or axillae bilaterally.  Gait antalgic gait MSK: Arthritic changes of multiple joints  Right shoulder exam shows the patient does have some atrophy of the musculature.  Patient does have some loss of range of motion.  3-5 strength of the rotator cuff.  Neurovascular intact distally.  Knee: Left  valgus deformity noted. Large thigh to calf ratio.  Tender to palpation over medial and PF joint line.  ROM full in flexion and extension and lower leg rotation. instability with valgus force.  painful patellar compression. Patellar glide with moderate crepitus. Patellar and quadriceps tendons unremarkable. Hamstring and quadriceps strength is normal. Contralateral knee shows mild arthritic changes as well  Procedure: Real-time Ultrasound Guided Injection of right glenohumeral joint Device: GE Logiq Q7  Ultrasound guided injection is preferred based studies that show increased duration, increased effect, greater accuracy, decreased procedural pain, increased response rate with ultrasound guided versus blind injection.  Verbal informed consent obtained.  Time-out  conducted.  Noted no overlying erythema, induration, or other signs of local infection.  Skin prepped in a sterile fashion.  Local anesthesia: Topical Ethyl chloride.  With sterile technique and under real time ultrasound guidance:  Joint visualized.  23g 1  inch needle inserted posterior approach. Pictures taken for needle placement. Patient did have injection of 2 cc of 1% lidocaine, 2  cc of 0.5% Marcaine, and 1.0 cc of Kenalog 40 mg/dL. Completed without difficulty  Pain immediately resolved suggesting accurate placement of the medication.  Advised to call if fevers/chills, erythema, induration, drainage, or persistent bleeding.  Images permanently stored and available for review in the ultrasound unit.  Impression: Technically successful ultrasound guided injection.  After informed written and verbal consent, patient was seated on exam table. Left knee was prepped with alcohol swab and utilizing anterolateral approach, patient's left knee space was injected with 4:1  marcaine 0.5%: Kenalog 40mg /dL. Patient tolerated the procedure well without immediate complications.   Impression and Recommendations:     This case required medical decision making of moderate complexity. The above documentation has been reviewed and is accurate and complete Lyndal Pulley, DO       Note: This dictation was prepared with Dragon dictation along with smaller phrase technology. Any transcriptional errors that result from this process are unintentional.

## 2019-03-04 NOTE — Assessment & Plan Note (Signed)
Repeat injection given today.  Tolerated the procedure well.  Discussed icing regimen and home exercise.  Discussed which activities of doing which wants to avoid.  Patient will continue to wear bracing when he can.  May need to repeat custom bracing secondary to patient's weight loss.  Follow-up again in 12 weeks

## 2019-03-04 NOTE — Patient Instructions (Signed)
God to see you  [name] is your friend Stay active See me again in 10 weeks Congrats!!!

## 2019-03-11 ENCOUNTER — Other Ambulatory Visit: Payer: Self-pay | Admitting: Internal Medicine

## 2019-03-14 ENCOUNTER — Other Ambulatory Visit: Payer: Self-pay | Admitting: Internal Medicine

## 2019-03-14 DIAGNOSIS — I428 Other cardiomyopathies: Secondary | ICD-10-CM

## 2019-03-14 DIAGNOSIS — I255 Ischemic cardiomyopathy: Secondary | ICD-10-CM

## 2019-03-14 DIAGNOSIS — I4819 Other persistent atrial fibrillation: Secondary | ICD-10-CM

## 2019-03-16 NOTE — Telephone Encounter (Signed)
Done erx 

## 2019-03-18 ENCOUNTER — Ambulatory Visit (INDEPENDENT_AMBULATORY_CARE_PROVIDER_SITE_OTHER): Payer: Medicare HMO

## 2019-03-18 DIAGNOSIS — I5022 Chronic systolic (congestive) heart failure: Secondary | ICD-10-CM | POA: Diagnosis not present

## 2019-03-18 DIAGNOSIS — Z95810 Presence of automatic (implantable) cardiac defibrillator: Secondary | ICD-10-CM | POA: Diagnosis not present

## 2019-03-19 ENCOUNTER — Ambulatory Visit (INDEPENDENT_AMBULATORY_CARE_PROVIDER_SITE_OTHER): Payer: Medicare HMO | Admitting: *Deleted

## 2019-03-19 DIAGNOSIS — I428 Other cardiomyopathies: Secondary | ICD-10-CM | POA: Diagnosis not present

## 2019-03-19 DIAGNOSIS — I5032 Chronic diastolic (congestive) heart failure: Secondary | ICD-10-CM

## 2019-03-20 ENCOUNTER — Other Ambulatory Visit (HOSPITAL_COMMUNITY): Payer: Self-pay | Admitting: Cardiology

## 2019-03-20 ENCOUNTER — Other Ambulatory Visit: Payer: Self-pay | Admitting: Internal Medicine

## 2019-03-20 LAB — CUP PACEART REMOTE DEVICE CHECK
Battery Remaining Longevity: 76 mo
Battery Voltage: 2.99 V
Brady Statistic AP VP Percent: 0 %
Brady Statistic AP VS Percent: 0 %
Brady Statistic AS VP Percent: 0 %
Brady Statistic AS VS Percent: 0 %
Brady Statistic RA Percent Paced: 0 %
Brady Statistic RV Percent Paced: 99.94 %
Date Time Interrogation Session: 20200603041605
HighPow Impedance: 73 Ohm
Implantable Lead Implant Date: 20181205
Implantable Lead Implant Date: 20181205
Implantable Lead Location: 753858
Implantable Lead Location: 753860
Implantable Lead Model: 4398
Implantable Pulse Generator Implant Date: 20181205
Lead Channel Impedance Value: 1026 Ohm
Lead Channel Impedance Value: 1026 Ohm
Lead Channel Impedance Value: 1083 Ohm
Lead Channel Impedance Value: 1083 Ohm
Lead Channel Impedance Value: 228 Ohm
Lead Channel Impedance Value: 279.484
Lead Channel Impedance Value: 279.484
Lead Channel Impedance Value: 279.484
Lead Channel Impedance Value: 279.484
Lead Channel Impedance Value: 342 Ohm
Lead Channel Impedance Value: 4047 Ohm
Lead Channel Impedance Value: 456 Ohm
Lead Channel Impedance Value: 456 Ohm
Lead Channel Impedance Value: 475 Ohm
Lead Channel Impedance Value: 722 Ohm
Lead Channel Impedance Value: 722 Ohm
Lead Channel Impedance Value: 836 Ohm
Lead Channel Impedance Value: 874 Ohm
Lead Channel Pacing Threshold Amplitude: 0.5 V
Lead Channel Pacing Threshold Pulse Width: 0.4 ms
Lead Channel Sensing Intrinsic Amplitude: 12.5 mV
Lead Channel Sensing Intrinsic Amplitude: 12.5 mV
Lead Channel Setting Pacing Amplitude: 2 V
Lead Channel Setting Pacing Amplitude: 2.5 V
Lead Channel Setting Pacing Pulse Width: 0.4 ms
Lead Channel Setting Pacing Pulse Width: 0.6 ms
Lead Channel Setting Sensing Sensitivity: 0.3 mV

## 2019-03-20 NOTE — Progress Notes (Signed)
EPIC Encounter for ICM Monitoring  Patient Name: [name] is a 63 y.o. male Date: 03/20/2019 Primary Care Physican: [name], MD Primary Cardiologist:[place] Electrophysiologist:Klein Bi-V Pacing:99.9% 3/27/2020Weight:227 lbs 02/04/2019 Weight: 214 lbs 03/20/2019 Weight: 220 lbs   Heart failure questions reviewed and has weight gain of 5-6 lbs in the last few weeks.    Thoracic impedanceabnormalsuggesting fluid accumulation since 03/13/2019.  Prescribed:Torsemide 20 mg to 2 tablets (40 mg total)every AM and 1 tablet (20 mg total) every PM.  Labs: 11/24/2018 Creatinine 1.32, BUN 18, Potassium 4.5, Sodium 139, GFR 57->60 01/13/2020Creatinine 1.17, BUN22, Potassium3.9, Sodium139, GFR>60 10/13/2018 Creatinine0.98, BUN17, Potassium4.5, Sodium141, GFR>60 07/14/2018 Creatinine1.32, BUN21, Potassium4.6, Sodium136, GFR56->60 A complete set of results can be found in Results Review.  Recommendations:He reports Dr [name] told him he may take an extra Torsemide when needed and he will take one today and tomorrow.     Follow-up plan: ICM clinic phone appointment on 03/25/2019 to recheck fluid levels.Office visit with Dr [name] 04/08/2019.  Copy of ICM check sent to Dr.Klein and Dr [name] for review and recommendations if needed.  3 month ICM trend: 03/18/2019    1 Year ICM trend:       [name], RN 03/20/2019 9:06 AM

## 2019-03-20 NOTE — Progress Notes (Addendum)
Call to patient.  Advised Dr [name] recommended to take torsemide to 40 mg bid x 4 days then back to 40 qam/20 qpm.  Advised to limit salt and fluid intake.  He verbalized understanding.

## 2019-03-20 NOTE — Progress Notes (Signed)
Increase torsemide to 40 mg bid x 4 days then back to 40 qam/20 qpm.

## 2019-03-23 ENCOUNTER — Encounter: Payer: Self-pay | Admitting: Internal Medicine

## 2019-03-23 ENCOUNTER — Other Ambulatory Visit (INDEPENDENT_AMBULATORY_CARE_PROVIDER_SITE_OTHER): Payer: Medicare HMO

## 2019-03-23 ENCOUNTER — Ambulatory Visit (INDEPENDENT_AMBULATORY_CARE_PROVIDER_SITE_OTHER): Payer: Medicare HMO | Admitting: Internal Medicine

## 2019-03-23 ENCOUNTER — Other Ambulatory Visit: Payer: Self-pay

## 2019-03-23 VITALS — BP 108/78 | HR 76 | Temp 97.9°F | Ht 70.0 in | Wt 218.0 lb

## 2019-03-23 DIAGNOSIS — E611 Iron deficiency: Secondary | ICD-10-CM | POA: Diagnosis not present

## 2019-03-23 DIAGNOSIS — E559 Vitamin D deficiency, unspecified: Secondary | ICD-10-CM

## 2019-03-23 DIAGNOSIS — E538 Deficiency of other specified B group vitamins: Secondary | ICD-10-CM

## 2019-03-23 DIAGNOSIS — J449 Chronic obstructive pulmonary disease, unspecified: Secondary | ICD-10-CM | POA: Diagnosis not present

## 2019-03-23 DIAGNOSIS — Z0000 Encounter for general adult medical examination without abnormal findings: Secondary | ICD-10-CM

## 2019-03-23 DIAGNOSIS — Z7901 Long term (current) use of anticoagulants: Secondary | ICD-10-CM | POA: Diagnosis not present

## 2019-03-23 DIAGNOSIS — E119 Type 2 diabetes mellitus without complications: Secondary | ICD-10-CM | POA: Diagnosis not present

## 2019-03-23 LAB — URINALYSIS, ROUTINE W REFLEX MICROSCOPIC
Bilirubin Urine: NEGATIVE
Hgb urine dipstick: NEGATIVE
Ketones, ur: NEGATIVE
Leukocytes,Ua: NEGATIVE
Nitrite: NEGATIVE
Specific Gravity, Urine: 1.015 (ref 1.000–1.030)
Total Protein, Urine: NEGATIVE
Urine Glucose: NEGATIVE
Urobilinogen, UA: 0.2 (ref 0.0–1.0)
pH: 6 (ref 5.0–8.0)

## 2019-03-23 LAB — IBC PANEL
Iron: 122 ug/dL (ref 42–165)
Saturation Ratios: 36.5 % (ref 20.0–50.0)
Transferrin: 239 mg/dL (ref 212.0–360.0)

## 2019-03-23 LAB — BASIC METABOLIC PANEL
BUN: 31 mg/dL — ABNORMAL HIGH (ref 6–23)
CO2: 28 mEq/L (ref 19–32)
Calcium: 9.1 mg/dL (ref 8.4–10.5)
Chloride: 103 mEq/L (ref 96–112)
Creatinine, Ser: 1.05 mg/dL (ref 0.40–1.50)
GFR: 71.05 mL/min (ref >60.00)
Glucose, Bld: 82 mg/dL (ref 70–99)
Potassium: 4.3 mEq/L (ref 3.5–5.1)
Sodium: 141 mEq/L (ref 135–145)

## 2019-03-23 LAB — MICROALBUMIN / CREATININE URINE RATIO
Creatinine,U: 29.1 mg/dL
Microalb Creat Ratio: 2.4 mg/g (ref 0.0–30.0)
Microalb, Ur: <0.7 mg/dL (ref 0.0–1.9)

## 2019-03-23 LAB — CBC WITH DIFFERENTIAL/PLATELET
Basophils Absolute: 0 10*3/uL (ref 0.0–0.1)
Basophils Relative: 0.5 % (ref 0.0–3.0)
Eosinophils Absolute: 0.3 10*3/uL (ref 0.0–0.7)
Eosinophils Relative: 3.2 % (ref 0.0–5.0)
HCT: 42.9 % (ref 39.0–52.0)
Hemoglobin: 14.4 g/dL (ref 13.0–17.0)
Lymphocytes Relative: 21.1 % (ref 12.0–46.0)
Lymphs Abs: 2 10*3/uL (ref 0.7–4.0)
MCHC: 33.5 g/dL (ref 30.0–36.0)
MCV: 102.6 fl — ABNORMAL HIGH (ref 78.0–100.0)
Monocytes Absolute: 0.7 10*3/uL (ref 0.1–1.0)
Monocytes Relative: 7.6 % (ref 3.0–12.0)
Neutro Abs: 6.4 10*3/uL (ref 1.4–7.7)
Neutrophils Relative %: 67.6 % (ref 43.0–77.0)
Platelets: 195 10*3/uL (ref 150.0–400.0)
RBC: 4.18 Mil/uL — ABNORMAL LOW (ref 4.22–5.81)
RDW: 13.7 % (ref 11.5–15.5)
WBC: 9.4 10*3/uL (ref 4.0–10.5)

## 2019-03-23 LAB — HEPATIC FUNCTION PANEL
ALT: 24 U/L (ref 0–53)
AST: 21 U/L (ref 0–37)
Albumin: 4.4 g/dL (ref 3.5–5.2)
Alkaline Phosphatase: 53 U/L (ref 39–117)
Bilirubin, Direct: 0.1 mg/dL (ref 0.0–0.3)
Total Bilirubin: 0.6 mg/dL (ref 0.2–1.2)
Total Protein: 6.7 g/dL (ref 6.0–8.3)

## 2019-03-23 LAB — LIPID PANEL
Cholesterol: 181 mg/dL (ref 0–200)
HDL: 55.9 mg/dL (ref >39.00)
LDL Cholesterol: 113 mg/dL — ABNORMAL HIGH (ref 0–99)
NonHDL: 125.35
Total CHOL/HDL Ratio: 3
Triglycerides: 63 mg/dL (ref 0.0–149.0)
VLDL: 12.6 mg/dL (ref 0.0–40.0)

## 2019-03-23 LAB — HEMOGLOBIN A1C: Hgb A1c MFr Bld: 5.5 % (ref 4.6–6.5)

## 2019-03-23 LAB — VITAMIN B12: Vitamin B-12: 397 pg/mL (ref 211–911)

## 2019-03-23 LAB — PSA: PSA: 4.73 ng/mL — ABNORMAL HIGH (ref 0.10–4.00)

## 2019-03-23 LAB — VITAMIN D 25 HYDROXY (VIT D DEFICIENCY, FRACTURES): VITD: 31.41 ng/mL (ref 30.00–100.00)

## 2019-03-23 LAB — TSH: TSH: 2.48 u[IU]/mL (ref 0.35–4.50)

## 2019-03-23 MED ORDER — TRAMADOL HCL 50 MG PO TABS: 50.0000 mg | ORAL_TABLET | Freq: Four times a day (QID) | ORAL | 2 refills | Status: DC | PRN

## 2019-03-23 MED ORDER — ZOLPIDEM TARTRATE 10 MG PO TABS: ORAL_TABLET | ORAL | 1 refills | Status: DC

## 2019-03-23 MED ORDER — APIXABAN 5 MG PO TABS: 5.0000 mg | ORAL_TABLET | Freq: Two times a day (BID) | ORAL | 3 refills | Status: DC

## 2019-03-23 NOTE — Assessment & Plan Note (Signed)
Pt declines to take further, will change to Eliquis asd

## 2019-03-23 NOTE — Progress Notes (Signed)
Subjective:    Patient ID: [name], male    DOB: 27-Jun-1956, 63 y.o.   MRN: [id number]  HPI  Here for wellness and f/u;  Overall doing ok;  Pt denies Chest pain, worsening SOB, DOE, wheezing, orthopnea, PND, worsening LE edema, palpitations, dizziness or syncope.  Pt denies neurological change such as new headache, facial or extremity weakness.  Pt denies polydipsia, polyuria, or low sugar symptoms. Pt states overall good compliance with treatment and medications, good tolerability, and has been trying to follow appropriate diet.  Pt denies worsening depressive symptoms, suicidal ideation or panic. No fever, night sweats, wt loss, loss of appetite, or other constitutional symptoms.  Pt states good ability with ADL's, has low fall risk, home safety reviewed and adequate, no other significant changes in hearing or vision, and only occasionally active with exercise. Conts to lose wt intentionally. Peak wt has been 345 about 2 yrs ago. Wt Readings from Last 3 Encounters:  03/23/19 218 lb (98.9 kg)  03/04/19 222 lb (100.7 kg)  12/24/18 221 lb (100.2 kg)  "I feel like I'm 44 again" after the ICD placement.  Has not seen eye doctor for yearly f/u due to cost, as insurance covers only partial optometry services with last cost was $278 out of pocket.  Missed INR check at 1 wk after last visit mar 10.  Still plans to see cardiology Jun 24.  Not currently taking it as he is worried about "bleeding out", and does not want to take.  Plans to ask cardiology for eliquis, but willing to try today as well. Past Medical History:  Diagnosis Date  . AICD (automatic cardioverter/defibrillator) present   . Anemia    supposed to be taking Vit B but doesn't  . ANXIETY    takes Xanax nightly  . Arthritis   . Asthma    Albuterol prn and Advair daily;also takes Prednisone daily  . Atrial fibrillation ([place]) 09/06/2015  . Cardiomyopathy [facility])    a. EF 25% TEE July 2013; b. EF normalized 2015;  c. 03/2015 Echo: EF  40-45%, difrf HK, PASP 38 mmHg, Mild MR, sev LAE/RAE.  [name] Chronic constipation    takes OTC stool softener  . COPD (chronic obstructive pulmonary disease) ([place])    "one dr says COPD; one dr says emphysema" (09/18/2017)  . DEPRESSION    takes Zoloft and Doxepin daily  . Diverticulitis   . DYSKINESIA, ESOPHAGUS   . Emphysema of lung [facility])    "one dr says COPD; one dr says emphysema" (09/18/2017)  . Essential hypertension       . FIBROMYALGIA   . GERD (gastroesophageal reflux disease)       . Glaucoma   . HYPERLIPIDEMIA    a. Intolerant to statins.  . INSOMNIA    takes Ambien nightly  . Myocardial infarction [facility])    a. 2012 Myoview notable for prior infarct;  b. 03/2015 Lexiscan CL: EF 37%, diff HK, small area of inferior infarct from apex to base-->Med Rx.  . Myocardial infarction ([place])   . O2 dependent    "2.5L q hs & prn" (09/18/2017)  . Paroxysmal atrial fibrillation ([facility])    a. CHA2DS2VASc = 3--> takes Coumadin;  b. 03/15/2015 Successful TEE/DCCV;  c. 03/2015 recurrent afib, Amio d/c'd in setting of hyperthyroidism.  . Peripheral neuropathy   . Pneumonia 12/2016  . Rash and other nonspecific skin eruption 04/12/2009   no cause found saw dermatologists x 2 and allergist  . SLEEP APNEA,  OBSTRUCTIVE    a. doesn't use CPAP  . Syncope    a. 03/2015 s/p MDT LINQ.  [name] Type II diabetes mellitus ([place])        Past Surgical History:  Procedure Laterality Date  . ACNE CYST REMOVAL     2 on back   . AV NODE ABLATION N/A 10/25/2017   Procedure: AV NODE ABLATION;  Surgeon: Deboraha Sprang, MD;  Location: [place] CV LAB;  Service: Cardiovascular;  Laterality: N/A;  . BIV ICD INSERTION CRT-D N/A 09/18/2017   Procedure: BIV ICD INSERTION CRT-D;  Surgeon: Deboraha Sprang, MD;  Location: [place] CV LAB;  Service: Cardiovascular;  Laterality: N/A;  . CARDIAC CATHETERIZATION N/A 03/21/2016   Procedure: Right/Left Heart Cath and Coronary Angiography;  Surgeon: Larey Dresser, MD;  Location: [place] CV LAB;  Service: Cardiovascular;  Laterality: N/A;  . CARDIOVERSION  04/18/2012   Procedure: CARDIOVERSION;  Surgeon: Fay Records, MD;  Location: [place];  Service: Cardiovascular;  Laterality: N/A;  . CARDIOVERSION  04/25/2012   Procedure: CARDIOVERSION;  Surgeon: Thayer Headings, MD;  Location: [place];  Service: Cardiovascular;  Laterality: N/A;  . CARDIOVERSION  04/25/2012   Procedure: CARDIOVERSION;  Surgeon: Fay Records, MD;  Location: [place];  Service: Cardiovascular;  Laterality: N/A;  . CARDIOVERSION  05/09/2012   Procedure: CARDIOVERSION;  Surgeon: Sherren Mocha, MD;  Location: [place];  Service: Cardiovascular;  Laterality: N/A;  changed from crenshaw to cooper by trish/leone-endo  . CARDIOVERSION N/A 03/15/2015   Procedure: CARDIOVERSION;  Surgeon: Thayer Headings, MD;  Location: [facility] ENDOSCOPY;  Service: Cardiovascular;  Laterality: N/A;  . COLONOSCOPY    . COLONOSCOPY WITH PROPOFOL N/A 10/21/2014   Procedure: COLONOSCOPY WITH PROPOFOL;  Surgeon: Ladene Artist, MD;  Location: [facility] ENDOSCOPY;  Service: Endoscopy;  Laterality: N/A;  . EP IMPLANTABLE DEVICE N/A 04/06/2015   Procedure: Loop Recorder Insertion;  Surgeon: Evans Lance, MD;  Location: [place] CV LAB;  Service: Cardiovascular;  Laterality: N/A;  . ESOPHAGOGASTRODUODENOSCOPY    . JOINT REPLACEMENT    . LOOP RECORDER REMOVAL N/A 09/18/2017   Procedure: LOOP RECORDER REMOVAL;  Surgeon: Deboraha Sprang, MD;  Location: [place] CV LAB;  Service: Cardiovascular;  Laterality: N/A;  . RIGHT/LEFT HEART CATH AND CORONARY ANGIOGRAPHY N/A 01/28/2017   Procedure: Right/Left Heart Cath and Coronary Angiography;  Surgeon: Larey Dresser, MD;  Location: [place] CV LAB;  Service: Cardiovascular;  Laterality: N/A;  . TEE WITHOUT CARDIOVERSION  04/25/2012   Procedure: TRANSESOPHAGEAL ECHOCARDIOGRAM (TEE);  Surgeon: Thayer Headings, MD;  Location: [place];  Service: Cardiovascular;  Laterality: N/A;  . TEE WITHOUT  CARDIOVERSION N/A 03/15/2015   Procedure: TRANSESOPHAGEAL ECHOCARDIOGRAM (TEE);  Surgeon: Thayer Headings, MD;  Location: [place];  Service: Cardiovascular;  Laterality: N/A;  . TONSILLECTOMY AND ADENOIDECTOMY    . TOTAL KNEE ARTHROPLASTY Right 06/15/2014   Procedure: TOTAL KNEE ARTHROPLASTY;  Surgeon: Renette Butters, MD;  Location: [place];  Service: Orthopedics;  Laterality: Right;    reports that he quit smoking about 11 years ago. His smoking use included cigarettes. He has a 60.00 pack-year smoking history. He has never used smokeless tobacco. He reports that he does not drink alcohol or use drugs. family history includes Allergies in his mother; Asthma in his maternal grandmother and mother; COPD in his mother; Colon polyps in his mother; Hypothyroidism in his mother. Allergies  Allergen Reactions  . Amiodarone Other (See Comments)  hyperthyroidism  . Statins Other (See Comments)    myalgia  . Tape Other (See Comments)    Skin Tears Use Paper Tape Only   Current Outpatient Medications on File Prior to Visit  Medication Sig Dispense Refill  . acetaminophen (TYLENOL) 500 MG tablet Take 500 mg by mouth every 6 (six) hours as needed.    [name] albuterol (VENTOLIN HFA) 108 (90 Base) MCG/ACT inhaler INHALE 2 PUFFS 4 TIMES A DAY as needed 54 Inhaler 3  . ALPRAZolam (XANAX) 0.5 MG tablet TAKE 2 TABLETS BY MOUTH AT BEDTIME 60 tablet 2  . carisoprodol (SOMA) 350 MG tablet TAKE 1 TABLET BY MOUTH 3 TIMES A DAY AS NEEDED FOR MUSCLE SPASMS 90 tablet 5  . carvedilol (COREG) 12.5 MG tablet TAKE 1 TABLET (12.5 MG TOTAL) BY MOUTH 2 (TWO) TIMES DAILY WITH A MEAL. 180 tablet 3  . cephALEXin (KEFLEX) 500 MG capsule Take 1 capsule (500 mg total) by mouth 2 (two) times daily. 14 capsule 0  . clobetasol ointment (TEMOVATE) 1.61 % Apply 1 application topically 2 (two) times daily. 60 g 1  . digoxin (LANOXIN) 0.25 MG tablet TAKE 0.5 TABLETS (0.125 MG TOTAL) BY MOUTH DAILY. 45 tablet 0  . doxepin (SINEQUAN) 10  MG capsule Take 2 capsules (20 mg total) by mouth at bedtime. Annual appt due in June must see provider for future refills 180 capsule 0  . ezetimibe (ZETIA) 10 MG tablet Take 1 tablet (10 mg total) by mouth daily. 90 tablet 3  . fluticasone (FLONASE) 50 MCG/ACT nasal spray PLACE 2 SPRAYS INTO BOTH NOSTRILS DAILY AS NEEDED FOR ALLERGIES OR RHINITIS. 16 g 0  . Fluticasone-Salmeterol (ADVAIR DISKUS) 250-50 MCG/DOSE AEPB Inhale 1 puff into the lungs 2 (two) times daily. 180 each 3  . hydrocortisone 2.5 % cream APPLY TO AFFECTED AREA TWICE A DAY 28.35 g 5  . hydrOXYzine (ATARAX/VISTARIL) 10 MG tablet TAKE 1 TABLET BY MOUTH THREE TIMES A DAY AS NEEDED 60 tablet 2  . ketoconazole (NIZORAL) 2 % cream APPLY TO AFFECTED AREA EVERY DAY 15 g 0  . lidocaine (LIDODERM) 5 % APPLY 1 PATCH TO AFFECTED AREA FOR UP TO 12 HOURS . DO NOT USE MORE THAN ONE PATCH IN 24 HOURS. 90 patch 1  . methotrexate (RHEUMATREX) 2.5 MG tablet TAKE 4 TABLETS BY MOUTH EVERY WEEK 48 tablet 1  . omeprazole (PRILOSEC) 20 MG capsule Take 1 capsule (20 mg total) by mouth daily with supper. 90 capsule 1  . omeprazole (PRILOSEC) 20 MG capsule TAKE 1 CAPSULE BY MOUTH TWICE A DAY 180 capsule 2  . predniSONE (DELTASONE) 10 MG tablet 3 tabs by mouth per day for 3 days,2tabs per day for 3 days,1tab per day for 3 days 18 tablet 0  . sertraline (ZOLOFT) 100 MG tablet TAKE 2 TABLETS BY MOUTH AT BEDTIME 180 tablet 3  . silver sulfADIAZINE (SILVADENE) 1 % cream APPLY TOPICALLY TO AFFECTED AREA EVERY DAY 50 g 3  . spironolactone (ALDACTONE) 25 MG tablet TAKE 1 TABLET BY MOUTH EVERY DAY 90 tablet 1  . sulfamethoxazole-trimethoprim (BACTRIM DS,SEPTRA DS) 800-160 MG tablet Take 1 tablet by mouth 2 (two) times daily. 14 tablet 0  . torsemide (DEMADEX) 20 MG tablet Take 40 mg by mouth daily. Take 2 tabs (40 mg) in the morning and 1 tab (20 mg) in the evening.    . traZODone (DESYREL) 100 MG tablet TAKE 1/2 TABLET BY MOUTH EVERY DAY AT BEDTIME AS NEEDED 45  tablet 1  .  traZODone (DESYREL) 50 MG tablet TAKE 1 TABLET (50 MG TOTAL) BY MOUTH AT BEDTIME AS NEEDED FOR SLEEP. 90 tablet 1  . triamcinolone (KENALOG) 0.1 % paste Use as directed 1 application in the mouth or throat 2 (two) times daily. 5 g 2  . TUMS 500 MG chewable tablet Chew 500-2,000 mg by mouth every 4 (four) hours as needed for indigestion or heartburn.     . valACYclovir (VALTREX) 1000 MG tablet TAKE 1 TABLET BY MOUTH THREE TIMES A DAY 21 tablet 2  . venlafaxine XR (EFFEXOR-XR) 37.5 MG 24 hr capsule TAKE ONE CAPSULE BY MOUTH EVERY DAY WITH BREAKFAST 90 capsule 1  . warfarin (COUMADIN) 5 MG tablet Take 1 tablet daily except 1/2 tablet on Monday and Friday or Take as directed by anticoagulation clinic. 90 tablet 1   No current facility-administered medications on file prior to visit.    Review of Systems Constitutional: Negative for other unusual diaphoresis, sweats, appetite or weight changes HENT: Negative for other worsening hearing loss, ear pain, facial swelling, mouth sores or neck stiffness.   Eyes: Negative for other worsening pain, redness or other visual disturbance.  Respiratory: Negative for other stridor or swelling Cardiovascular: Negative for other palpitations or other chest pain  Gastrointestinal: Negative for worsening diarrhea or loose stools, blood in stool, distention or other pain Genitourinary: Negative for hematuria, flank pain or other change in urine volume.  Musculoskeletal: Negative for myalgias or other joint swelling.  Skin: Negative for other color change, or other wound or worsening drainage.  Neurological: Negative for other syncope or numbness. Hematological: Negative for other adenopathy or swelling Psychiatric/Behavioral: Negative for hallucinations, other worsening agitation, SI, self-injury, or new decreased concentration All other system neg per pt    Objective:   Physical Exam BP 108/78 (BP Location: Left Arm, Patient Position: Sitting, Cuff  Size: Large)   Pulse 76   Temp 97.9 F (36.6 C) (Oral)   Ht 5\' 10"  (1.778 m)   Wt 218 lb (98.9 kg)   SpO2 97%   BMI 31.28 kg/m  VS noted,  Constitutional: Pt is oriented to person, place, and time. Appears well-developed and well-nourished, in no significant distress and comfortable Head: Normocephalic and atraumatic  Eyes: Conjunctivae and EOM are normal. Pupils are equal, round, and reactive to light Right Ear: External ear normal without discharge Left Ear: External ear normal without discharge Nose: Nose without discharge or deformity Mouth/Throat: Oropharynx is without other ulcerations and moist  Neck: Normal range of motion. Neck supple. No JVD present. No tracheal deviation present or significant neck LA or mass Cardiovascular: Normal rate, regular rhythm, normal heart sounds and intact distal pulses.   Pulmonary/Chest: WOB normal and breath sounds without rales or wheezing  Abdominal: Soft. Bowel sounds are normal. NT. No HSM  Musculoskeletal: Normal range of motion. Exhibits no edema Lymphadenopathy: Has no other cervical adenopathy.  Neurological: Pt is alert and oriented to person, place, and time. Pt has normal reflexes. No cranial nerve deficit. Motor grossly intact, Gait intact Skin: Skin is warm and dry. No rash noted or new ulcerations Psychiatric:  Has normal mood and affect. Behavior is normal without agitation No other exam findings  Lab Results  Component Value Date   WBC 7.9 10/13/2018   HGB 14.9 10/13/2018   HCT 45.6 10/13/2018   PLT 200 10/13/2018   GLUCOSE 89 11/24/2018   CHOL 184 10/27/2018   TRIG 108 10/27/2018   HDL 38 (L) 10/27/2018   LDLDIRECT 121 (H)  12/19/2017   LDLCALC 124 (H) 10/27/2018   ALT 24 11/14/2018   AST 25 11/14/2018   NA 139 11/24/2018   K 4.5 11/24/2018   CL 106 11/24/2018   CREATININE 1.32 (H) 11/24/2018   BUN 18 11/24/2018   CO2 26 11/24/2018   TSH 2.31 03/27/2018   PSA 1.81 03/27/2018   INR 1.0 (A) 12/23/2018   HGBA1C  4.9 09/22/2018   MICROALBUR 3.0 (H) 04/18/2017      Assessment & Plan:

## 2019-03-23 NOTE — Patient Instructions (Addendum)
Please take all new medication as prescribed - the Eliquis at 5 mg twice per day  Please continue all other medications as before, and refills have been done if requested.  Please have the pharmacy call with any other refills you may need.  Please continue your efforts at being more active, low cholesterol diet, and weight control.  You are otherwise up to date with prevention measures today.  Please keep your appointments with your specialists as you may have planned  Please go to the LAB in the Basement (turn left off the elevator) for the tests to be done today  You will be contacted by phone if any changes need to be made immediately.  Otherwise, you will receive a letter about your results with an explanation, but please check with MyChart first.  Please remember to sign up for MyChart if you have not done so, as this will be important to you in the future with finding out test results, communicating by private email, and scheduling acute appointments online when needed.  Please return in 6 months, or sooner if needed, with Lab testing done 3-5 days before

## 2019-03-23 NOTE — Assessment & Plan Note (Signed)
Overall doing well, age appropriate education and counseling updated, referrals for preventative services and immunizations addressed, dietary and smoking counseling addressed, most recent labs reviewed.  I have personally reviewed and have noted:  1) the patient's medical and social history 2) The pt's use of alcohol, tobacco, and illicit drugs 3) The patient's current medications and supplements 4) Functional ability including ADL's, fall risk, home safety risk, hearing and visual impairment 5) Diet and physical activities 6) Evidence for depression or mood disorder 7) The patient's height, weight, and BMI have been recorded in the chart  I have made referrals, and provided counseling and education based on review of the above  

## 2019-03-23 NOTE — Assessment & Plan Note (Signed)
stable overall by history and exam, recent data reviewed with pt, and pt to continue medical treatment as before,  to f/u any worsening symptoms or concerns  

## 2019-03-24 ENCOUNTER — Ambulatory Visit: Payer: Self-pay | Admitting: General Practice

## 2019-03-24 ENCOUNTER — Other Ambulatory Visit: Payer: Self-pay | Admitting: Internal Medicine

## 2019-03-25 ENCOUNTER — Ambulatory Visit (INDEPENDENT_AMBULATORY_CARE_PROVIDER_SITE_OTHER): Payer: Medicare HMO

## 2019-03-25 DIAGNOSIS — Z95810 Presence of automatic (implantable) cardiac defibrillator: Secondary | ICD-10-CM

## 2019-03-25 DIAGNOSIS — I5032 Chronic diastolic (congestive) heart failure: Secondary | ICD-10-CM

## 2019-03-27 NOTE — Progress Notes (Signed)
EPIC Encounter for ICM Monitoring  Patient Name: [name] is a 63 y.o. male Date: 03/27/2019 Primary Care Physican: [name], MD Primary Cardiologist:[place] Electrophysiologist:K[facility] Bi-V Pacing:100% 02/04/2019 Weight:214lbs 03/20/2019 Weight: 220 lbs 03/25/2019 Weight: 214 lbs   Heart failure questions reviewed and weight has returned to baseline.     Thoracic impedancereturned normal after taking extra Torsemide x 4 days.  Prescribed:Torsemide 20 mg to 2 tablets (40 mg total)every AM and 1 tablet (20 mg total) every PM.  Labs: 11/24/2018 Creatinine 1.32, BUN 18, Potassium 4.5, Sodium 139, GFR 57->60 01/13/2020Creatinine 1.17, BUN22, Potassium3.9, Sodium139, GFR>60 10/13/2018 Creatinine0.98, BUN17, Potassium4.5, Sodium141, GFR>60 07/14/2018 Creatinine1.32, BUN21, Potassium4.6, Sodium136, GFR56->60 A complete set of results can be found in Results Review.  Recommendations:Advised to limit salt intake and fluid intake.      Follow-up plan: ICM clinic phone appointment on 04/27/2019.Office visit with Dr [name] 04/08/2019.  Copy of ICM check sent to [place].  3 month ICM trend: 03/25/2019    1 Year ICM trend:       [name], RN 03/27/2019 4:54 PM

## 2019-03-27 NOTE — Progress Notes (Signed)
Remote ICD transmission.   

## 2019-04-01 ENCOUNTER — Other Ambulatory Visit: Payer: Self-pay | Admitting: Allergy and Immunology

## 2019-04-01 DIAGNOSIS — Z8709 Personal history of other diseases of the respiratory system: Secondary | ICD-10-CM

## 2019-04-01 DIAGNOSIS — J3089 Other allergic rhinitis: Secondary | ICD-10-CM

## 2019-04-02 ENCOUNTER — Other Ambulatory Visit: Payer: Self-pay | Admitting: Allergy and Immunology

## 2019-04-02 DIAGNOSIS — J3089 Other allergic rhinitis: Secondary | ICD-10-CM

## 2019-04-02 DIAGNOSIS — Z8709 Personal history of other diseases of the respiratory system: Secondary | ICD-10-CM

## 2019-04-08 ENCOUNTER — Encounter (HOSPITAL_COMMUNITY): Payer: Medicare HMO | Admitting: Cardiology

## 2019-04-08 ENCOUNTER — Other Ambulatory Visit: Payer: Self-pay | Admitting: Internal Medicine

## 2019-04-08 ENCOUNTER — Other Ambulatory Visit: Payer: Self-pay | Admitting: *Deleted

## 2019-04-08 DIAGNOSIS — Z8709 Personal history of other diseases of the respiratory system: Secondary | ICD-10-CM

## 2019-04-08 DIAGNOSIS — L308 Other specified dermatitis: Secondary | ICD-10-CM

## 2019-04-08 DIAGNOSIS — J3089 Other allergic rhinitis: Secondary | ICD-10-CM

## 2019-04-08 NOTE — Telephone Encounter (Signed)
Done erx 

## 2019-04-09 ENCOUNTER — Telehealth: Payer: Self-pay | Admitting: Internal Medicine

## 2019-04-09 NOTE — Telephone Encounter (Signed)
Disregard

## 2019-04-10 ENCOUNTER — Telehealth: Payer: Self-pay | Admitting: Internal Medicine

## 2019-04-10 DIAGNOSIS — J3089 Other allergic rhinitis: Secondary | ICD-10-CM

## 2019-04-10 DIAGNOSIS — Z8709 Personal history of other diseases of the respiratory system: Secondary | ICD-10-CM

## 2019-04-10 MED ORDER — FLUTICASONE PROPIONATE 50 MCG/ACT NA SUSP: 2.0000 | Freq: Every day | NASAL | 5 refills | Status: DC | PRN

## 2019-04-10 NOTE — Telephone Encounter (Signed)
Done erx 

## 2019-04-10 NOTE — Addendum Note (Signed)
Addended by: [name] on: 04/10/2019 05:27 PM   Modules accepted: Orders

## 2019-04-10 NOTE — Telephone Encounter (Signed)
Copied from [place] [id number]. Topic: General - Other >> Apr 10, 2019  9:44 AM Carolyn Stare wrote: Pt said a allergy doctor rx him the below med a few years ago and he no longer sees him and is asking of Dr [name] will RX the medicine   fluticasone (FLONASE) 50 MCG/ACT nasal spray  Pharmacy [facility] [address]

## 2019-04-13 DIAGNOSIS — S01411A Laceration without foreign body of right cheek and temporomandibular area, initial encounter: Secondary | ICD-10-CM | POA: Diagnosis not present

## 2019-04-13 DIAGNOSIS — M25561 Pain in right knee: Secondary | ICD-10-CM | POA: Diagnosis not present

## 2019-04-14 ENCOUNTER — Telehealth: Payer: Self-pay

## 2019-04-14 NOTE — Telephone Encounter (Signed)
Returned patient call as requested by voice mail message.  He reports he was on ladder yesterday and hammer struck him in the cheek.  He required 9 stitches in cheek at urgent care.  He said his chest is very sore today after taking the fall and urgent care physician checked his chest and heart. He denies any broken skin or bruises in chest area, has not noticed any palpitations or changes with heart.  Advised if he has changes in his condition to use ER for evaluation if needed.  Advised to monitor check area with the stitches for signs of infection and he said urgent care gave him a list of what to look for and how to care for the wound.  No further recommendations today.

## 2019-04-20 DIAGNOSIS — S01411D Laceration without foreign body of right cheek and temporomandibular area, subsequent encounter: Secondary | ICD-10-CM | POA: Diagnosis not present

## 2019-04-20 DIAGNOSIS — M25561 Pain in right knee: Secondary | ICD-10-CM | POA: Diagnosis not present

## 2019-04-22 ENCOUNTER — Ambulatory Visit: Payer: Medicare HMO | Admitting: Family Medicine

## 2019-04-22 ENCOUNTER — Other Ambulatory Visit: Payer: Self-pay

## 2019-04-22 ENCOUNTER — Encounter: Payer: Self-pay | Admitting: Family Medicine

## 2019-04-22 DIAGNOSIS — M1712 Unilateral primary osteoarthritis, left knee: Secondary | ICD-10-CM | POA: Diagnosis not present

## 2019-04-22 DIAGNOSIS — M7552 Bursitis of left shoulder: Secondary | ICD-10-CM

## 2019-04-22 NOTE — Patient Instructions (Signed)
Good to see you  Ice 20 minutes 2 times daily. Usually after activity and before bed. Injected knee and shoulder  Stay upright  Keep appointment

## 2019-04-22 NOTE — Assessment & Plan Note (Signed)
Repeat injection given today.  Patient did have x-rays from an outside facility that he states was noted fracture.  Concern for more of a contusion.  Discussed icing regimen and home exercise.  Discussed which activities to do which wants to avoid.  Patient should increase activity as tolerated.  Follow-up again in 4 to 8 weeks

## 2019-04-22 NOTE — Progress Notes (Signed)
[name] [address], [place] [address] Phone: [phone] Subjective:   I [name] am serving as a Education administrator for Dr. Hulan Saas.  I'm seeing this patient by the request  of:    CC: Recent fall with left-sided shoulder and knee pain.  WEX:[id number]  [name] is a 63 y.o. male coming in with complaint of left knee and shoulder pain. States he was on a ladder and fell over. [name] struck him in the face. Painful to walk. Bruising on his chest and stitches in his face.  Onset- Monday  Location - knee cap and left shoulder   Character- sharp, dull, achy sore  Aggravating factors- walking Severity-originally 9 out of 10 but now 5 out of 10.     Past Medical History:  Diagnosis Date  . AICD (automatic cardioverter/defibrillator) present   . Anemia    supposed to be taking Vit B but doesn't  . ANXIETY    takes Xanax nightly  . Arthritis   . Asthma    Albuterol prn and Advair daily;also takes Prednisone daily  . Atrial fibrillation ([place]) 09/06/2015  . Cardiomyopathy [facility])    a. EF 25% TEE July 2013; b. EF normalized 2015;  c. 03/2015 Echo: EF 40-45%, difrf HK, PASP 38 mmHg, Mild MR, sev LAE/RAE.  [name] Chronic constipation    takes OTC stool softener  . COPD (chronic obstructive pulmonary disease) ([place])    "one dr says COPD; one dr says emphysema" (09/18/2017)  . DEPRESSION    takes Zoloft and Doxepin daily  . Diverticulitis   . DYSKINESIA, ESOPHAGUS   . Emphysema of lung [facility])    "one dr says COPD; one dr says emphysema" (09/18/2017)  . Essential hypertension       . FIBROMYALGIA   . GERD (gastroesophageal reflux disease)       . Glaucoma   . HYPERLIPIDEMIA    a. Intolerant to statins.  . INSOMNIA    takes Ambien nightly  . Myocardial infarction [facility])    a. 2012 Myoview notable for prior infarct;  b. 03/2015 Lexiscan CL: EF 37%, diff HK, small area of inferior infarct from apex to base-->Med Rx.  . Myocardial infarction  ([place])   . O2 dependent    "2.5L q hs & prn" (09/18/2017)  . Paroxysmal atrial fibrillation ([facility])    a. CHA2DS2VASc = 3--> takes Coumadin;  b. 03/15/2015 Successful TEE/DCCV;  c. 03/2015 recurrent afib, Amio d/c'd in setting of hyperthyroidism.  . Peripheral neuropathy   . Pneumonia 12/2016  . Rash and other nonspecific skin eruption 04/12/2009   no cause found saw dermatologists x 2 and allergist  . SLEEP APNEA, OBSTRUCTIVE    a. doesn't use CPAP  . Syncope    a. 03/2015 s/p MDT LINQ.  [name] Type II diabetes mellitus ([place])        Past Surgical History:  Procedure Laterality Date  . ACNE CYST REMOVAL     2 on back   . AV NODE ABLATION N/A 10/25/2017   Procedure: AV NODE ABLATION;  Surgeon: Deboraha Sprang, MD;  Location: [place] CV LAB;  Service: Cardiovascular;  Laterality: N/A;  . BIV ICD INSERTION CRT-D N/A 09/18/2017   Procedure: BIV ICD INSERTION CRT-D;  Surgeon: Deboraha Sprang, MD;  Location: [place] CV LAB;  Service: Cardiovascular;  Laterality: N/A;  . CARDIAC CATHETERIZATION N/A 03/21/2016   Procedure: Right/Left Heart Cath and Coronary Angiography;  Surgeon: Elby Showers  [name], MD;  Location: [place] CV LAB;  Service: Cardiovascular;  Laterality: N/A;  . CARDIOVERSION  04/18/2012   Procedure: CARDIOVERSION;  Surgeon: Fay Records, MD;  Location: [place];  Service: Cardiovascular;  Laterality: N/A;  . CARDIOVERSION  04/25/2012   Procedure: CARDIOVERSION;  Surgeon: Thayer Headings, MD;  Location: [place];  Service: Cardiovascular;  Laterality: N/A;  . CARDIOVERSION  04/25/2012   Procedure: CARDIOVERSION;  Surgeon: Fay Records, MD;  Location: [place];  Service: Cardiovascular;  Laterality: N/A;  . CARDIOVERSION  05/09/2012   Procedure: CARDIOVERSION;  Surgeon: Sherren Mocha, MD;  Location: [place];  Service: Cardiovascular;  Laterality: N/A;  changed from crenshaw to cooper by trish/leone-endo  . CARDIOVERSION N/A 03/15/2015   Procedure: CARDIOVERSION;  Surgeon: Thayer Headings, MD;   Location: [facility] ENDOSCOPY;  Service: Cardiovascular;  Laterality: N/A;  . COLONOSCOPY    . COLONOSCOPY WITH PROPOFOL N/A 10/21/2014   Procedure: COLONOSCOPY WITH PROPOFOL;  Surgeon: Ladene Artist, MD;  Location: [facility] ENDOSCOPY;  Service: Endoscopy;  Laterality: N/A;  . EP IMPLANTABLE DEVICE N/A 04/06/2015   Procedure: Loop Recorder Insertion;  Surgeon: Evans Lance, MD;  Location: [place] CV LAB;  Service: Cardiovascular;  Laterality: N/A;  . ESOPHAGOGASTRODUODENOSCOPY    . JOINT REPLACEMENT    . LOOP RECORDER REMOVAL N/A 09/18/2017   Procedure: LOOP RECORDER REMOVAL;  Surgeon: Deboraha Sprang, MD;  Location: [place] CV LAB;  Service: Cardiovascular;  Laterality: N/A;  . RIGHT/LEFT HEART CATH AND CORONARY ANGIOGRAPHY N/A 01/28/2017   Procedure: Right/Left Heart Cath and Coronary Angiography;  Surgeon: Larey Dresser, MD;  Location: [place] CV LAB;  Service: Cardiovascular;  Laterality: N/A;  . TEE WITHOUT CARDIOVERSION  04/25/2012   Procedure: TRANSESOPHAGEAL ECHOCARDIOGRAM (TEE);  Surgeon: Thayer Headings, MD;  Location: [place];  Service: Cardiovascular;  Laterality: N/A;  . TEE WITHOUT CARDIOVERSION N/A 03/15/2015   Procedure: TRANSESOPHAGEAL ECHOCARDIOGRAM (TEE);  Surgeon: Thayer Headings, MD;  Location: [place];  Service: Cardiovascular;  Laterality: N/A;  . TONSILLECTOMY AND ADENOIDECTOMY    . TOTAL KNEE ARTHROPLASTY Right 06/15/2014   Procedure: TOTAL KNEE ARTHROPLASTY;  Surgeon: Renette Butters, MD;  Location: [place];  Service: Orthopedics;  Laterality: Right;   Social History   Socioeconomic History  . Marital status: Divorced    Spouse name: Not on file  . Number of children: 2  . Years of education: Not on file  . Highest education level: Not on file  Occupational History  . Occupation: retired/disabled. prev worked in Therapist, sports.    Employer: DISABLED  Social Needs  . Financial resource strain: Not on file  . Food insecurity    Worry: Not on file     Inability: Not on file  . Transportation needs    Medical: Not on file    Non-medical: Not on file  Tobacco Use  . Smoking status: Former Smoker    Packs/day: 2.00    Years: 30.00    Pack years: 60.00    Types: Cigarettes    Quit date: 10/16/2007    Years since quitting: 11.5  . Smokeless tobacco: Never Used  Substance and Sexual Activity  . Alcohol use: No  . Drug use: No  . Sexual activity: Not Currently  Lifestyle  . Physical activity    Days per week: Not on file    Minutes per session: Not on file  . Stress: Not on file  Relationships  . Social connections  Talks on phone: Not on file    Gets together: Not on file    Attends religious service: Not on file    Active member of club or organization: Not on file    Attends meetings of clubs or organizations: Not on file    Relationship status: Not on file  Other Topics Concern  . Not on file  Social History Narrative   Lives alone.   Allergies  Allergen Reactions  . Amiodarone Other (See Comments)    hyperthyroidism  . Statins Other (See Comments)    myalgia  . Tape Other (See Comments)    Skin Tears Use Paper Tape Only   Family History  Problem Relation Age of Onset  . COPD Mother   . Asthma Mother   . Colon polyps Mother   . Allergies Mother   . Hypothyroidism Mother   . Asthma Maternal Grandmother   . Colon cancer Neg Hx     Current Outpatient Medications (Endocrine & Metabolic):  .  predniSONE (DELTASONE) 10 MG tablet, 3 tabs by mouth per day for 3 days,2tabs per day for 3 days,1tab per day for 3 days  Current Outpatient Medications (Cardiovascular):  .  carvedilol (COREG) 12.5 MG tablet, TAKE 1 TABLET (12.5 MG TOTAL) BY MOUTH 2 (TWO) TIMES DAILY WITH A MEAL. [name]  digoxin (LANOXIN) 0.25 MG tablet, TAKE 0.5 TABLETS (0.125 MG TOTAL) BY MOUTH DAILY. [name]  ezetimibe (ZETIA) 10 MG tablet, Take 1 tablet (10 mg total) by mouth daily. [name]  spironolactone (ALDACTONE) 25 MG tablet, TAKE 1 TABLET BY MOUTH EVERY DAY .   torsemide (DEMADEX) 20 MG tablet, Take 40 mg by mouth daily. Take 2 tabs (40 mg) in the morning and 1 tab (20 mg) in the evening.  Current Outpatient Medications (Respiratory):  .  albuterol (VENTOLIN HFA) 108 (90 Base) MCG/ACT inhaler, INHALE 2 PUFFS 4 TIMES A DAY as needed .  fluticasone (FLONASE) 50 MCG/ACT nasal spray, Place 2 sprays into both nostrils daily as needed for allergies or rhinitis. .  Fluticasone-Salmeterol (ADVAIR DISKUS) 250-50 MCG/DOSE AEPB, Inhale 1 puff into the lungs 2 (two) times daily.  Current Outpatient Medications (Analgesics):  .  acetaminophen (TYLENOL) 500 MG tablet, Take 500 mg by mouth every 6 (six) hours as needed. .  traMADol (ULTRAM) 50 MG tablet, Take 1 tablet (50 mg total) by mouth every 6 (six) hours as needed.  Current Outpatient Medications (Hematological):  .  apixaban (ELIQUIS) 5 MG TABS tablet, Take 1 tablet (5 mg total) by mouth 2 (two) times daily. [name]  warfarin (COUMADIN) 5 MG tablet, Take 1 tablet daily except 1/2 tablet on Monday and Friday or Take as directed by anticoagulation clinic.  Current Outpatient Medications (Other):  [name]  ALPRAZolam (XANAX) 0.5 MG tablet, TAKE 2 TABLETS BY MOUTH AT BEDTIME .  carisoprodol (SOMA) 350 MG tablet, TAKE 1 TABLET BY MOUTH 3 TIMES A DAY AS NEEDED FOR MUSCLE SPASMS .  cephALEXin (KEFLEX) 500 MG capsule, Take 1 capsule (500 mg total) by mouth 2 (two) times daily. .  clobetasol ointment (TEMOVATE) 7.41 %, Apply 1 application topically 2 (two) times daily. [name]  doxepin (SINEQUAN) 10 MG capsule, TAKE 2 CAPSULES BY MOUTH AT BEDTIME. ANNUAL APPT DUE IN JUNE MUST SEE PROVIDER FOR FUTURE REFILLS .  hydrocortisone 2.5 % cream, APPLY TO AFFECTED AREA TWICE A DAY .  hydrOXYzine (ATARAX/VISTARIL) 10 MG tablet, TAKE 1 TABLET BY MOUTH THREE TIMES A DAY AS NEEDED .  ketoconazole (NIZORAL) 2 % cream,  APPLY TO AFFECTED AREA EVERY DAY .  lidocaine (LIDODERM) 5 %, APPLY 1 PATCH TO AFFECTED AREA FOR UP TO 12 HOURS . DO NOT USE MORE  THAN ONE PATCH IN 24 HOURS. .  methotrexate (RHEUMATREX) 2.5 MG tablet, TAKE 4 TABLETS BY MOUTH EVERY WEEK .  omeprazole (PRILOSEC) 20 MG capsule, Take 1 capsule (20 mg total) by mouth daily with supper. [name]  omeprazole (PRILOSEC) 20 MG capsule, TAKE 1 CAPSULE BY MOUTH TWICE A DAY .  sertraline (ZOLOFT) 100 MG tablet, TAKE 2 TABLETS BY MOUTH AT BEDTIME .  silver sulfADIAZINE (SILVADENE) 1 % cream, APPLY TOPICALLY TO AFFECTED AREA EVERY DAY .  sulfamethoxazole-trimethoprim (BACTRIM DS,SEPTRA DS) 800-160 MG tablet, Take 1 tablet by mouth 2 (two) times daily. .  traZODone (DESYREL) 100 MG tablet, TAKE 1/2 TABLET BY MOUTH EVERY DAY AT BEDTIME AS NEEDED .  traZODone (DESYREL) 50 MG tablet, TAKE 1 TABLET (50 MG TOTAL) BY MOUTH AT BEDTIME AS NEEDED FOR SLEEP. [name]  triamcinolone (KENALOG) 0.1 % paste, Use as directed 1 application in the mouth or throat 2 (two) times daily. .  TUMS 500 MG chewable tablet, Chew 500-2,000 mg by mouth every 4 (four) hours as needed for indigestion or heartburn.  .  valACYclovir (VALTREX) 1000 MG tablet, TAKE 1 TABLET BY MOUTH THREE TIMES A DAY .  venlafaxine XR (EFFEXOR-XR) 37.5 MG 24 hr capsule, TAKE ONE CAPSULE BY MOUTH EVERY DAY WITH BREAKFAST .  zolpidem (AMBIEN) 10 MG tablet, TAKE 1 TABLET BY MOUTH EVERYDAY AT BEDTIME    Past medical history, social, surgical and family history all reviewed in electronic medical record.  No pertanent information unless stated regarding to the chief complaint.   Review of Systems:  No headache, visual changes, nausea, vomiting, diarrhea, constipation, dizziness, abdominal pain, skin rash, fevers, chills, night sweats, weight loss, swollen lymph nodes, body aches, joint swelling,  chest pain, shortness of breath, mood changes.  Positive muscle aches  Objective  Blood pressure 100/60, pulse 75, height 5\' 10"  (1.778 m), weight 218 lb (98.9 kg), SpO2 97 %.    General: No apparent distress alert and oriented x3 mood and affect normal,  dressed appropriately.  Patient has bruising on the right side of the face HEENT: Pupils equal, extraocular movements intact  Respiratory: Patient's speak in full sentences and does not appear short of breath  Cardiovascular: No lower extremity edema, non tender, no erythema  Skin: Warm dry intact with no signs of infection or rash on extremities or on axial skeleton.  Abdomen: Soft nontender  Neuro: Cranial nerves II through XII are intact, neurovascularly intact in all extremities with 2+ DTRs and 2+ pulses.  Lymph: No lymphadenopathy of posterior or anterior cervical chain or axillae bilaterally.  Gait antalgic MSK:   Knee: Left valgus deformity noted.  Abnormal thigh to calf ratio.  Tender to palpation over medial and PF joint line.  Pain over the patella itself ROM full in flexion and extension and lower leg rotation. instability with valgus force.  painful patellar compression. Patellar glide with moderate crepitus. Patellar and quadriceps tendons unremarkable. Hamstring and quadriceps strength is normal.   Left shoulder exam shows the patient does have atrophy noted.  Patient does have crepitus.  Rotator cuff strength 4 out of 5 but more about patient baseline.  Patient has good grip strength and neurovascular intact distally.  After informed written and verbal consent, patient was seated on exam table. Left knee was prepped with alcohol swab and utilizing anterolateral approach,  patient's left knee space was injected with 4:1  marcaine 0.5%: Kenalog 40mg /dL. Patient tolerated the procedure well without immediate complications.  Procedure: Real-time Ultrasound Guided Injection of left glenohumeral joint Device: GE Logiq E  Ultrasound guided injection is preferred based studies that show increased duration, increased effect, greater accuracy, decreased procedural pain, increased response rate with ultrasound guided versus blind injection.  Verbal informed consent obtained.   Time-out conducted.  Noted no overlying erythema, induration, or other signs of local infection.  Skin prepped in a sterile fashion.  Local anesthesia: Topical Ethyl chloride.  With sterile technique and under real time ultrasound guidance:  Joint visualized.  21g 2 inch needle inserted posterior approach. Pictures taken for needle placement. Patient did have injection of 2 cc of 0.5% Marcaine, and 1cc of Kenalog 40 mg/dL. Completed without difficulty  Pain immediately resolved suggesting accurate placement of the medication.  Advised to call if fevers/chills, erythema, induration, drainage, or persistent bleeding.  Images permanently stored and available for review in the ultrasound unit.  Impression: Technically successful ultrasound guided injection.    Impression and Recommendations:     This case required medical decision making of moderate complexity. The above documentation has been reviewed and is accurate and complete Lyndal Pulley, DO       Note: This dictation was prepared with Dragon dictation along with smaller phrase technology. Any transcriptional errors that result from this process are unintentional.

## 2019-04-22 NOTE — Assessment & Plan Note (Signed)
Recent fall may cause exacerbation.  Do not feel that there is any type of fracture.  Do not feel further work-up is necessary.  Patient given injection and tolerated the procedure well.  Patient has significant improvement in almost a year feeling better.  I think patient will do well again.  Follow-up again 4 to 6 weeks

## 2019-04-26 ENCOUNTER — Other Ambulatory Visit: Payer: Self-pay | Admitting: Internal Medicine

## 2019-04-27 ENCOUNTER — Ambulatory Visit (INDEPENDENT_AMBULATORY_CARE_PROVIDER_SITE_OTHER): Payer: Medicare HMO

## 2019-04-27 DIAGNOSIS — Z95810 Presence of automatic (implantable) cardiac defibrillator: Secondary | ICD-10-CM | POA: Diagnosis not present

## 2019-04-27 DIAGNOSIS — I5032 Chronic diastolic (congestive) heart failure: Secondary | ICD-10-CM | POA: Diagnosis not present

## 2019-05-01 NOTE — Progress Notes (Signed)
EPIC Encounter for ICM Monitoring  Patient Name: [name] is a 63 y.o. male Date: 05/01/2019 Primary Care Physican: [name], MD Primary Cardiologist:[place] Electrophysiologist:[facility] Pacing:100% 03/25/2019 Weight: 214 lbs 05/01/2019 Weight: 214.8 lbs  Heart failure questions reviewedand he is feeling fine.   Optivol thoracic impedancenormal.  Prescribed:Torsemide 20 mg to 2 tablets (40 mg total)every AM and 1 tablet (20 mg total) every PM.  Labs: 11/24/2018 Creatinine 1.32, BUN 18, Potassium 4.5, Sodium 139, GFR 57->60 01/13/2020Creatinine 1.17, BUN22, Potassium3.9, Sodium139, GFR>60 10/13/2018 Creatinine0.98, BUN17, Potassium4.5, Sodium141, GFR>60 07/14/2018 Creatinine1.32, BUN21, Potassium4.6, Sodium136, GFR56->60 A complete set of results can be found in Results Review.  Recommendations:Advised to limit salt intake and fluid intake.    Follow-up plan: ICM clinic phone appointment on8/17/2020.Office visit with Dr [name] 05/21/2019.  Copy of ICM check sent to [facility].  3 month ICM trend: 04/27/2019    1 Year ICM trend:       [name], RN 05/01/2019 9:37 AM

## 2019-05-12 ENCOUNTER — Other Ambulatory Visit (HOSPITAL_COMMUNITY): Payer: Self-pay | Admitting: Cardiology

## 2019-05-12 ENCOUNTER — Other Ambulatory Visit: Payer: Self-pay | Admitting: Internal Medicine

## 2019-05-13 ENCOUNTER — Other Ambulatory Visit: Payer: Self-pay

## 2019-05-13 ENCOUNTER — Encounter: Payer: Self-pay | Admitting: Family Medicine

## 2019-05-13 ENCOUNTER — Ambulatory Visit: Payer: Medicare HMO | Admitting: Family Medicine

## 2019-05-13 DIAGNOSIS — M1712 Unilateral primary osteoarthritis, left knee: Secondary | ICD-10-CM

## 2019-05-13 NOTE — Patient Instructions (Addendum)
[name] will call about custom knee brace See me in 4-6 weeks

## 2019-05-13 NOTE — Assessment & Plan Note (Signed)
Patient continued to have pain.  Patient was approved and given Visco supplementation today.  Discussed icing regimen and home exercises, which activities to do which wants to avoid.  Discussed posture and ergonomics.  Follow-up again in 4 to 8 weeks

## 2019-05-13 NOTE — Progress Notes (Signed)
[name] [place],  [address] Phone: [phone] Subjective:     CC: Knee and shoulder pain follow-up  HWE:[id number]   04/22/2019: Repeat injection given today for shoulder.  Patient did have x-rays from an outside facility that he states was noted fracture.  Concern for more of a contusion.  Discussed icing regimen and home exercise.  Discussed which activities to do which wants to avoid.  Patient should increase activity as tolerated.  Follow-up again in 4 to 8 weeks  Recent fall may cause exacerbation of knee.  Do not feel that there is any type of fracture.  Do not feel further work-up is necessary.  Patient given injection and tolerated the procedure well.  Patient has significant improvement in almost a year feeling better.  I think patient will do well again.  Follow-up again 4 to 6 weeks  Update 05/13/2019: [name] is a 63 y.o. male coming in with complaint of left knee and left shoulder. Pain in shoulder has subsided. Left knee still bothers him. Feels unstable. Does wear hinged brace which helps with stability.  Patient states that he would like to consider the custom brace again.  Previously it was too expensive.  Hoping that he will be able to this time.  Patient states that he continues to have knee pain on a daily basis.     Past Medical History:  Diagnosis Date  . AICD (automatic cardioverter/defibrillator) present   . Anemia    supposed to be taking Vit B but doesn't  . ANXIETY    takes Xanax nightly  . Arthritis   . Asthma    Albuterol prn and Advair daily;also takes Prednisone daily  . Atrial fibrillation ([place]) 09/06/2015  . Cardiomyopathy [facility])    a. EF 25% TEE July 2013; b. EF normalized 2015;  c. 03/2015 Echo: EF 40-45%, difrf HK, PASP 38 mmHg, Mild MR, sev LAE/RAE.  [name] Chronic constipation    takes OTC stool softener  . COPD (chronic obstructive pulmonary disease) ([place])    "one dr says COPD; one dr says  emphysema" (09/18/2017)  . DEPRESSION    takes Zoloft and Doxepin daily  . Diverticulitis   . DYSKINESIA, ESOPHAGUS   . Emphysema of lung [facility])    "one dr says COPD; one dr says emphysema" (09/18/2017)  . Essential hypertension       . FIBROMYALGIA   . GERD (gastroesophageal reflux disease)       . Glaucoma   . HYPERLIPIDEMIA    a. Intolerant to statins.  . INSOMNIA    takes Ambien nightly  . Myocardial infarction [facility])    a. 2012 Myoview notable for prior infarct;  b. 03/2015 Lexiscan CL: EF 37%, diff HK, small area of inferior infarct from apex to base-->Med Rx.  . Myocardial infarction ([place])   . O2 dependent    "2.5L q hs & prn" (09/18/2017)  . Paroxysmal atrial fibrillation ([facility])    a. CHA2DS2VASc = 3--> takes Coumadin;  b. 03/15/2015 Successful TEE/DCCV;  c. 03/2015 recurrent afib, Amio d/c'd in setting of hyperthyroidism.  . Peripheral neuropathy   . Pneumonia 12/2016  . Rash and other nonspecific skin eruption 04/12/2009   no cause found saw dermatologists x 2 and allergist  . SLEEP APNEA, OBSTRUCTIVE    a. doesn't use CPAP  . Syncope    a. 03/2015 s/p MDT LINQ.  [name] Type II diabetes mellitus ([place])  Past Surgical History:  Procedure Laterality Date  . ACNE CYST REMOVAL     2 on back   . AV NODE ABLATION N/A 10/25/2017   Procedure: AV NODE ABLATION;  Surgeon: Deboraha Sprang, MD;  Location: [place] CV LAB;  Service: Cardiovascular;  Laterality: N/A;  . BIV ICD INSERTION CRT-D N/A 09/18/2017   Procedure: BIV ICD INSERTION CRT-D;  Surgeon: Deboraha Sprang, MD;  Location: [place] [facility];  Service: Cardiovascular;  Laterality: N/A;  . CARDIAC CATHETERIZATION N/A 03/21/2016   Procedure: Right/Left Heart Cath and Coronary Angiography;  Surgeon: Larey Dresser, MD;  Location: [place] [facility];  Service: Cardiovascular;  Laterality: N/A;  . CARDIOVERSION  04/18/2012   Procedure: CARDIOVERSION;  Surgeon: Fay Records, MD;  Location: [place];  Service: Cardiovascular;   Laterality: N/A;  . CARDIOVERSION  04/25/2012   Procedure: CARDIOVERSION;  Surgeon: Thayer Headings, MD;  Location: [place];  Service: Cardiovascular;  Laterality: N/A;  . CARDIOVERSION  04/25/2012   Procedure: CARDIOVERSION;  Surgeon: Fay Records, MD;  Location: [place];  Service: Cardiovascular;  Laterality: N/A;  . CARDIOVERSION  05/09/2012   Procedure: CARDIOVERSION;  Surgeon: Sherren Mocha, MD;  Location: [place];  Service: Cardiovascular;  Laterality: N/A;  changed from crenshaw to cooper by trish/leone-endo  . CARDIOVERSION N/A 03/15/2015   Procedure: CARDIOVERSION;  Surgeon: Thayer Headings, MD;  Location: [facility] ENDOSCOPY;  Service: Cardiovascular;  Laterality: N/A;  . COLONOSCOPY    . COLONOSCOPY WITH PROPOFOL N/A 10/21/2014   Procedure: COLONOSCOPY WITH PROPOFOL;  Surgeon: Ladene Artist, MD;  Location: [facility] ENDOSCOPY;  Service: Endoscopy;  Laterality: N/A;  . EP IMPLANTABLE DEVICE N/A 04/06/2015   Procedure: Loop Recorder Insertion;  Surgeon: Evans Lance, MD;  Location: [place] CV LAB;  Service: Cardiovascular;  Laterality: N/A;  . ESOPHAGOGASTRODUODENOSCOPY    . JOINT REPLACEMENT    . LOOP RECORDER REMOVAL N/A 09/18/2017   Procedure: LOOP RECORDER REMOVAL;  Surgeon: Deboraha Sprang, MD;  Location: [place] [facility];  Service: Cardiovascular;  Laterality: N/A;  . RIGHT/LEFT HEART CATH AND CORONARY ANGIOGRAPHY N/A 01/28/2017   Procedure: Right/Left Heart Cath and Coronary Angiography;  Surgeon: Larey Dresser, MD;  Location: [place] [facility];  Service: Cardiovascular;  Laterality: N/A;  . TEE WITHOUT CARDIOVERSION  04/25/2012   Procedure: TRANSESOPHAGEAL ECHOCARDIOGRAM (TEE);  Surgeon: Thayer Headings, MD;  Location: [place];  Service: Cardiovascular;  Laterality: N/A;  . TEE WITHOUT CARDIOVERSION N/A 03/15/2015   Procedure: TRANSESOPHAGEAL ECHOCARDIOGRAM (TEE);  Surgeon: Thayer Headings, MD;  Location: [place];  Service: Cardiovascular;  Laterality: N/A;  . TONSILLECTOMY  AND ADENOIDECTOMY    . TOTAL KNEE ARTHROPLASTY Right 06/15/2014   Procedure: TOTAL KNEE ARTHROPLASTY;  Surgeon: Renette Butters, MD;  Location: [place];  Service: Orthopedics;  Laterality: Right;   Social History   Socioeconomic History  . Marital status: Divorced    Spouse name: Not on file  . Number of children: 2  . Years of education: Not on file  . Highest education level: Not on file  Occupational History  . Occupation: retired/disabled. prev worked in Therapist, sports.    Employer: DISABLED  Social Needs  . Financial resource strain: Not on file  . Food insecurity    Worry: Not on file    Inability: Not on file  . Transportation needs    Medical: Not on file    Non-medical: Not on file  Tobacco Use  .  Smoking status: Former Smoker    Packs/day: 2.00    Years: 30.00    Pack years: 60.00    Types: Cigarettes    Quit date: 10/16/2007    Years since quitting: 11.5  . Smokeless tobacco: Never Used  Substance and Sexual Activity  . Alcohol use: No  . Drug use: No  . Sexual activity: Not Currently  Lifestyle  . Physical activity    Days per week: Not on file    Minutes per session: Not on file  . Stress: Not on file  Relationships  . Social Herbalist on phone: Not on file    Gets together: Not on file    Attends religious service: Not on file    Active member of club or organization: Not on file    Attends meetings of clubs or organizations: Not on file    Relationship status: Not on file  Other Topics Concern  . Not on file  Social History Narrative   Lives alone.   Allergies  Allergen Reactions  . Amiodarone Other (See Comments)    hyperthyroidism  . Statins Other (See Comments)    myalgia  . Tape Other (See Comments)    Skin Tears Use Paper Tape Only   Family History  Problem Relation Age of Onset  . COPD Mother   . Asthma Mother   . Colon polyps Mother   . Allergies Mother   . Hypothyroidism Mother   . Asthma Maternal Grandmother   .  Colon cancer Neg Hx     Current Outpatient Medications (Endocrine & Metabolic):  .  predniSONE (DELTASONE) 10 MG tablet, 3 tabs by mouth per day for 3 days,2tabs per day for 3 days,1tab per day for 3 days  Current Outpatient Medications (Cardiovascular):  .  carvedilol (COREG) 12.5 MG tablet, TAKE 1 TABLET (12.5 MG TOTAL) BY MOUTH 2 (TWO) TIMES DAILY WITH A MEAL. [name]  digoxin (LANOXIN) 0.25 MG tablet, TAKE 0.5 TABLETS (0.125 MG TOTAL) BY MOUTH DAILY. [name]  ezetimibe (ZETIA) 10 MG tablet, Take 1 tablet (10 mg total) by mouth daily. [name]  spironolactone (ALDACTONE) 25 MG tablet, TAKE 1 TABLET BY MOUTH EVERY DAY .  torsemide (DEMADEX) 20 MG tablet, Take 40 mg by mouth daily. Take 2 tabs (40 mg) in the morning and 1 tab (20 mg) in the evening.  Current Outpatient Medications (Respiratory):  .  albuterol (VENTOLIN HFA) 108 (90 Base) MCG/ACT inhaler, INHALE 2 PUFFS 4 TIMES A DAY as needed .  fluticasone (FLONASE) 50 MCG/ACT nasal spray, Place 2 sprays into both nostrils daily as needed for allergies or rhinitis. .  Fluticasone-Salmeterol (ADVAIR DISKUS) 250-50 MCG/DOSE AEPB, Inhale 1 puff into the lungs 2 (two) times daily.  Current Outpatient Medications (Analgesics):  .  acetaminophen (TYLENOL) 500 MG tablet, Take 500 mg by mouth every 6 (six) hours as needed. .  traMADol (ULTRAM) 50 MG tablet, Take 1 tablet (50 mg total) by mouth every 6 (six) hours as needed.  Current Outpatient Medications (Hematological):  .  apixaban (ELIQUIS) 5 MG TABS tablet, Take 1 tablet (5 mg total) by mouth 2 (two) times daily. [name]  warfarin (COUMADIN) 5 MG tablet, Take 1 tablet daily except 1/2 tablet on Monday and Friday or Take as directed by anticoagulation clinic.  Current Outpatient Medications (Other):  [name]  ALPRAZolam (XANAX) 0.5 MG tablet, TAKE 2 TABLETS BY MOUTH AT BEDTIME .  carisoprodol (SOMA) 350 MG tablet, TAKE 1 TABLET BY MOUTH 3 TIMES  A DAY AS NEEDED FOR MUSCLE SPASMS .  cephALEXin (KEFLEX) 500 MG capsule,  Take 1 capsule (500 mg total) by mouth 2 (two) times daily. .  clobetasol ointment (TEMOVATE) 8.93 %, Apply 1 application topically 2 (two) times daily. [name]  doxepin (SINEQUAN) 10 MG capsule, TAKE 2 CAPSULES BY MOUTH AT BEDTIME. ANNUAL APPT DUE IN JUNE MUST SEE PROVIDER FOR FUTURE REFILLS .  hydrocortisone 2.5 % cream, APPLY TO AFFECTED AREA TWICE A DAY .  hydrOXYzine (ATARAX/VISTARIL) 10 MG tablet, TAKE 1 TABLET BY MOUTH THREE TIMES A DAY AS NEEDED .  ketoconazole (NIZORAL) 2 % cream, APPLY TO AFFECTED AREA EVERY DAY .  lidocaine (LIDODERM) 5 %, APPLY 1 PATCH TO AFFECTED AREA FOR UP TO 12 HOURS . DO NOT USE MORE THAN ONE PATCH IN 24 HOURS. .  methotrexate (RHEUMATREX) 2.5 MG tablet, TAKE 4 TABLETS BY MOUTH EVERY WEEK .  omeprazole (PRILOSEC) 20 MG capsule, Take 1 capsule (20 mg total) by mouth daily with supper. [name]  omeprazole (PRILOSEC) 20 MG capsule, TAKE 1 CAPSULE BY MOUTH TWICE A DAY .  sertraline (ZOLOFT) 100 MG tablet, TAKE 2 TABLETS BY MOUTH AT BEDTIME .  silver sulfADIAZINE (SILVADENE) 1 % cream, APPLY TOPICALLY TO AFFECTED AREA EVERY DAY .  sulfamethoxazole-trimethoprim (BACTRIM DS,SEPTRA DS) 800-160 MG tablet, Take 1 tablet by mouth 2 (two) times daily. .  traZODone (DESYREL) 100 MG tablet, TAKE 1/2 TABLET BY MOUTH EVERY DAY AT BEDTIME AS NEEDED .  traZODone (DESYREL) 50 MG tablet, TAKE 1 TABLET (50 MG TOTAL) BY MOUTH AT BEDTIME AS NEEDED FOR SLEEP. [name]  triamcinolone (KENALOG) 0.1 % paste, Use as directed 1 application in the mouth or throat 2 (two) times daily. .  TUMS 500 MG chewable tablet, Chew 500-2,000 mg by mouth every 4 (four) hours as needed for indigestion or heartburn.  .  valACYclovir (VALTREX) 1000 MG tablet, TAKE 1 TABLET BY MOUTH THREE TIMES A DAY .  venlafaxine XR (EFFEXOR-XR) 37.5 MG 24 hr capsule, TAKE ONE CAPSULE BY MOUTH EVERY DAY WITH BREAKFAST .  zolpidem (AMBIEN) 10 MG tablet, TAKE 1 TABLET BY MOUTH EVERYDAY AT BEDTIME    Past medical history, social, surgical and  family history all reviewed in electronic medical record.  No pertanent information unless stated regarding to the chief complaint.   Review of Systems:  No headache, visual changes, nausea, vomiting, diarrhea, constipation, dizziness, abdominal pain, skin rash, fevers, chills, night sweats, weight loss, swollen lymph nodes,  chest pain, shortness of breath, mood changes.  Positive muscle aches, joint swelling, body aches  Objective  Blood pressure 106/82, pulse 76, height 5\' 10"  (1.778 m), weight 218 lb (98.9 kg), SpO2 97 %.    General: No apparent distress alert and oriented x3 mood and affect normal, dressed appropriately.  HEENT: Pupils equal, extraocular movements intact  Respiratory: Patient's speak in full sentences and does not appear short of breath  Cardiovascular: No lower extremity edema, non tender, no erythema  Skin: Warm dry intact with no signs of infection or rash on extremities or on axial skeleton.  Abdomen: Soft nontender  Neuro: Cranial nerves II through XII are intact, neurovascularly intact in all extremities with 2+ DTRs and 2+ pulses.  Lymph: No lymphadenopathy of posterior or anterior cervical chain or axillae bilaterally.   Gait antalgic MSK:  tender with limited range of motion and good stability and symmetric strength and tone of , elbows, wrist, hip, and ankles bilaterally.  Knee: Left valgus deformity noted.  Abnormal thigh  to calf ratio.  Tender to palpation over medial and PF joint line.  ROM full in flexion and extension and lower leg rotation. instability with valgus force.  painful patellar compression. Patellar glide with moderate crepitus. Patellar and quadriceps tendons unremarkable. Hamstring and quadriceps strength is normal. Contralateral knee shows replacement noted.  After informed written and verbal consent, patient was seated on exam table. Left knee was prepped with alcohol swab and utilizing anterolateral approach, patient's left knee  space was injected with 22 mg/mL of Monovisc (sodium hyaluronate) in a prefilled syringe was injected easily into the knee through a 22-gauge needlePatient tolerated the procedure well without immediate complications.     Impression and Recommendations:     This case required medical decision making of moderate complexity. The above documentation has been reviewed and is accurate and complete Lyndal Pulley, DO       Note: This dictation was prepared with Dragon dictation along with smaller phrase technology. Any transcriptional errors that result from this process are unintentional.

## 2019-05-20 ENCOUNTER — Other Ambulatory Visit: Payer: Self-pay | Admitting: Internal Medicine

## 2019-05-20 DIAGNOSIS — I255 Ischemic cardiomyopathy: Secondary | ICD-10-CM

## 2019-05-20 DIAGNOSIS — I4819 Other persistent atrial fibrillation: Secondary | ICD-10-CM

## 2019-05-20 DIAGNOSIS — I428 Other cardiomyopathies: Secondary | ICD-10-CM

## 2019-05-20 NOTE — Telephone Encounter (Signed)
Done erx 

## 2019-05-21 ENCOUNTER — Inpatient Hospital Stay (HOSPITAL_COMMUNITY): Admission: RE | Admit: 2019-05-21 | Payer: Medicare HMO | Source: Ambulatory Visit | Admitting: Cardiology

## 2019-05-22 ENCOUNTER — Telehealth: Payer: Self-pay

## 2019-05-22 NOTE — Telephone Encounter (Signed)
Copied from [place] [id number]. Topic: General - Other >> May 22, 2019  1:19 PM Celene Kras A wrote: Reason for CRM: Pt called and is requesting a letter be written for him stating it would be beneficial for him to attend the [facility] for his general health. Please advise.

## 2019-05-22 NOTE — Telephone Encounter (Signed)
I am not clear on what he wants  Does he want a letter to say he is healthy enough for exercise? Or that he simply needs to exercise such that maybe he gets a free admission? Or something related to COVID that he is low risk enough to go to [facility]?

## 2019-05-25 ENCOUNTER — Encounter: Payer: Self-pay | Admitting: Internal Medicine

## 2019-05-25 NOTE — Telephone Encounter (Signed)
Ok this is done 

## 2019-05-25 NOTE — Telephone Encounter (Signed)
Called pt, LVM.  Need clarification on what letter needs to say.

## 2019-05-25 NOTE — Telephone Encounter (Signed)
Patient returning call from [place]. Please advise and call back.

## 2019-05-25 NOTE — Telephone Encounter (Signed)
Letter needs to say for "medical necessity for weight management and mobility".

## 2019-05-26 NOTE — Telephone Encounter (Signed)
Ok to print again if needed

## 2019-05-27 NOTE — Telephone Encounter (Signed)
Letter has been mailed.

## 2019-05-29 ENCOUNTER — Other Ambulatory Visit: Payer: Self-pay | Admitting: Internal Medicine

## 2019-05-29 DIAGNOSIS — M1712 Unilateral primary osteoarthritis, left knee: Secondary | ICD-10-CM | POA: Diagnosis not present

## 2019-05-29 NOTE — Telephone Encounter (Signed)
Done erx 

## 2019-06-01 ENCOUNTER — Ambulatory Visit (INDEPENDENT_AMBULATORY_CARE_PROVIDER_SITE_OTHER): Payer: Medicare HMO

## 2019-06-01 DIAGNOSIS — Z95810 Presence of automatic (implantable) cardiac defibrillator: Secondary | ICD-10-CM

## 2019-06-01 DIAGNOSIS — I5032 Chronic diastolic (congestive) heart failure: Secondary | ICD-10-CM

## 2019-06-02 NOTE — Progress Notes (Signed)
EPIC Encounter for ICM Monitoring  Patient Name: [name] is a 63 y.o. male Date: 06/02/2019 Primary Care Physican: [name], MD Primary Cardiologist:[place] Electrophysiologist:K[facility] Bi-V Pacing:100% 03/25/2019 Weight: 214 lbs 05/01/2019 Weight: 214.8 lbs  Attempted call to patient and unable to reach.  Left detailed message per DPR regarding transmission. Transmission reviewed.   Optivol thoracic impedancenormal.  Prescribed:Torsemide 20 mg to 2 tablets (40 mg total)every AM and 1 tablet (20 mg total) every PM.  Labs: 11/24/2018 Creatinine 1.32, BUN 18, Potassium 4.5, Sodium 139, GFR 57->60 01/13/2020Creatinine 1.17, BUN22, Potassium3.9, Sodium139, GFR>60 10/13/2018 Creatinine0.98, BUN17, Potassium4.5, Sodium141, GFR>60 07/14/2018 Creatinine1.32, BUN21, Potassium4.6, Sodium136, GFR56->60 A complete set of results can be found in Results Review.  Recommendations:Left voice mail with ICM number and encouraged to call if experiencing any fluid symptoms.  Follow-up plan: ICM clinic phone appointment on10/02/2019.91 day device check 06/18/2019.  Office visit with Dr [name] 07/24/2019.  Copy of ICM check sent to [place].   3 month ICM trend: 06/01/2019    1 Year ICM trend:       [name], RN 06/02/2019 11:54 AM

## 2019-06-03 ENCOUNTER — Telehealth: Payer: Self-pay

## 2019-06-03 NOTE — Telephone Encounter (Signed)
Remote ICM transmission received.  Attempted call to patient regarding ICM remote transmission and left detailed message, per DPR, with next ICM remote transmission date of 07/20/2019.  Advised to return call for any fluid symptoms or questions.

## 2019-06-04 ENCOUNTER — Other Ambulatory Visit: Payer: Self-pay | Admitting: Internal Medicine

## 2019-06-17 ENCOUNTER — Ambulatory Visit: Payer: Self-pay

## 2019-06-17 ENCOUNTER — Encounter: Payer: Self-pay | Admitting: Family Medicine

## 2019-06-17 ENCOUNTER — Ambulatory Visit: Payer: Medicare HMO | Admitting: Family Medicine

## 2019-06-17 ENCOUNTER — Other Ambulatory Visit: Payer: Self-pay

## 2019-06-17 ENCOUNTER — Other Ambulatory Visit (HOSPITAL_COMMUNITY): Payer: Self-pay | Admitting: Cardiology

## 2019-06-17 VITALS — BP 100/70 | HR 76 | Ht 70.0 in | Wt 223.0 lb

## 2019-06-17 DIAGNOSIS — G8929 Other chronic pain: Secondary | ICD-10-CM

## 2019-06-17 DIAGNOSIS — M25562 Pain in left knee: Secondary | ICD-10-CM

## 2019-06-17 DIAGNOSIS — M75101 Unspecified rotator cuff tear or rupture of right shoulder, not specified as traumatic: Secondary | ICD-10-CM

## 2019-06-17 DIAGNOSIS — M12811 Other specific arthropathies, not elsewhere classified, right shoulder: Secondary | ICD-10-CM | POA: Diagnosis not present

## 2019-06-17 DIAGNOSIS — M12812 Other specific arthropathies, not elsewhere classified, left shoulder: Secondary | ICD-10-CM

## 2019-06-17 DIAGNOSIS — M1712 Unilateral primary osteoarthritis, left knee: Secondary | ICD-10-CM | POA: Diagnosis not present

## 2019-06-17 NOTE — Assessment & Plan Note (Signed)
Patient given injection and tolerated the procedure well.  Discussed icing regimen and home exercise, which activities of doing which wants to avoid.  Increase activity as tolerated.  Follow-up again in 4 to 6 weeks

## 2019-06-17 NOTE — Progress Notes (Signed)
[name] [place],  [address] Phone: [phone] Subjective:   I Kandace Blitz am serving as a Education administrator for Dr. Hulan Saas.  I'm seeing this patient by the request  of:    CC: Knee pain, bilateral shoulder pain  [id number]   05/13/2019 Patient continued to have pain.  Patient was approved and given Visco supplementation today.  Discussed icing regimen and home exercises, which activities to do which wants to avoid.  Discussed posture and ergonomics.  Follow-up again in 4 to 8 weeks   BERAT HEAVEY is a 63 y.o. male coming in with complaint of left knee pain. States that he would like his cyst drained. Would also like bilateral shoulder injection. Right is worse.  Known rotator cuff arthropathy bilaterally.  Continues to try to lose weight but finds it difficult to workout regularly secondary to the discomfort and pain in the shoulders.  Does not want any surgical intervention.  Knee pain is more medial and posterior.  Does have severe arthritic changes.  Noticing swelling more posterior.  Losing range of motion    Past Medical History:  Diagnosis Date  . AICD (automatic cardioverter/defibrillator) present   . Anemia    supposed to be taking Vit B but doesn't  . ANXIETY    takes Xanax nightly  . Arthritis   . Asthma    Albuterol prn and Advair daily;also takes Prednisone daily  . Atrial fibrillation ([place]) 09/06/2015  . Cardiomyopathy [facility])    a. EF 25% TEE July 2013; b. EF normalized 2015;  c. 03/2015 Echo: EF 40-45%, difrf HK, PASP 38 mmHg, Mild MR, sev LAE/RAE.  [name] Chronic constipation    takes OTC stool softener  . COPD (chronic obstructive pulmonary disease) ([place])    "one dr says COPD; one dr says emphysema" (09/18/2017)  . DEPRESSION    takes Zoloft and Doxepin daily  . Diverticulitis   . DYSKINESIA, ESOPHAGUS   . Emphysema of lung [facility])    "one dr says COPD; one dr says emphysema" (09/18/2017)  . Essential  hypertension       . FIBROMYALGIA   . GERD (gastroesophageal reflux disease)       . Glaucoma   . HYPERLIPIDEMIA    a. Intolerant to statins.  . INSOMNIA    takes Ambien nightly  . Myocardial infarction [facility])    a. 2012 Myoview notable for prior infarct;  b. 03/2015 Lexiscan CL: EF 37%, diff HK, small area of inferior infarct from apex to base-->Med Rx.  . Myocardial infarction ([place])   . O2 dependent    "2.5L q hs & prn" (09/18/2017)  . Paroxysmal atrial fibrillation ([facility])    a. CHA2DS2VASc = 3--> takes Coumadin;  b. 03/15/2015 Successful TEE/DCCV;  c. 03/2015 recurrent afib, Amio d/c'd in setting of hyperthyroidism.  . Peripheral neuropathy   . Pneumonia 12/2016  . Rash and other nonspecific skin eruption 04/12/2009   no cause found saw dermatologists x 2 and allergist  . SLEEP APNEA, OBSTRUCTIVE    a. doesn't use CPAP  . Syncope    a. 03/2015 s/p MDT LINQ.  [name] Type II diabetes mellitus ([place])        Past Surgical History:  Procedure Laterality Date  . ACNE CYST REMOVAL     2 on back   . AV NODE ABLATION N/A 10/25/2017   Procedure: AV NODE ABLATION;  Surgeon: Deboraha Sprang, MD;  Location: [place] CV LAB;  Service: Cardiovascular;  Laterality: N/A;  . BIV ICD INSERTION CRT-D N/A 09/18/2017   Procedure: BIV ICD INSERTION CRT-D;  Surgeon: Deboraha Sprang, MD;  Location: [place] CV LAB;  Service: Cardiovascular;  Laterality: N/A;  . CARDIAC CATHETERIZATION N/A 03/21/2016   Procedure: Right/Left Heart Cath and Coronary Angiography;  Surgeon: Larey Dresser, MD;  Location: [place] CV LAB;  Service: Cardiovascular;  Laterality: N/A;  . CARDIOVERSION  04/18/2012   Procedure: CARDIOVERSION;  Surgeon: Fay Records, MD;  Location: [place];  Service: Cardiovascular;  Laterality: N/A;  . CARDIOVERSION  04/25/2012   Procedure: CARDIOVERSION;  Surgeon: Thayer Headings, MD;  Location: [place];  Service: Cardiovascular;  Laterality: N/A;  . CARDIOVERSION  04/25/2012   Procedure:  CARDIOVERSION;  Surgeon: Fay Records, MD;  Location: [place];  Service: Cardiovascular;  Laterality: N/A;  . CARDIOVERSION  05/09/2012   Procedure: CARDIOVERSION;  Surgeon: Sherren Mocha, MD;  Location: [place];  Service: Cardiovascular;  Laterality: N/A;  changed from crenshaw to cooper by trish/leone-endo  . CARDIOVERSION N/A 03/15/2015   Procedure: CARDIOVERSION;  Surgeon: Thayer Headings, MD;  Location: [facility] ENDOSCOPY;  Service: Cardiovascular;  Laterality: N/A;  . COLONOSCOPY    . COLONOSCOPY WITH PROPOFOL N/A 10/21/2014   Procedure: COLONOSCOPY WITH PROPOFOL;  Surgeon: Ladene Artist, MD;  Location: [facility] ENDOSCOPY;  Service: Endoscopy;  Laterality: N/A;  . EP IMPLANTABLE DEVICE N/A 04/06/2015   Procedure: Loop Recorder Insertion;  Surgeon: Evans Lance, MD;  Location: [place] CV LAB;  Service: Cardiovascular;  Laterality: N/A;  . ESOPHAGOGASTRODUODENOSCOPY    . JOINT REPLACEMENT    . LOOP RECORDER REMOVAL N/A 09/18/2017   Procedure: LOOP RECORDER REMOVAL;  Surgeon: Deboraha Sprang, MD;  Location: [place] CV LAB;  Service: Cardiovascular;  Laterality: N/A;  . RIGHT/LEFT HEART CATH AND CORONARY ANGIOGRAPHY N/A 01/28/2017   Procedure: Right/Left Heart Cath and Coronary Angiography;  Surgeon: Larey Dresser, MD;  Location: [place] CV LAB;  Service: Cardiovascular;  Laterality: N/A;  . TEE WITHOUT CARDIOVERSION  04/25/2012   Procedure: TRANSESOPHAGEAL ECHOCARDIOGRAM (TEE);  Surgeon: Thayer Headings, MD;  Location: [place];  Service: Cardiovascular;  Laterality: N/A;  . TEE WITHOUT CARDIOVERSION N/A 03/15/2015   Procedure: TRANSESOPHAGEAL ECHOCARDIOGRAM (TEE);  Surgeon: Thayer Headings, MD;  Location: [place];  Service: Cardiovascular;  Laterality: N/A;  . TONSILLECTOMY AND ADENOIDECTOMY    . TOTAL KNEE ARTHROPLASTY Right 06/15/2014   Procedure: TOTAL KNEE ARTHROPLASTY;  Surgeon: Renette Butters, MD;  Location: [place];  Service: Orthopedics;  Laterality: Right;   Social History    Socioeconomic History  . Marital status: Divorced    Spouse name: Not on file  . Number of children: 2  . Years of education: Not on file  . Highest education level: Not on file  Occupational History  . Occupation: retired/disabled. prev worked in Therapist, sports.    Employer: DISABLED  Social Needs  . Financial resource strain: Not on file  . Food insecurity    Worry: Not on file    Inability: Not on file  . Transportation needs    Medical: Not on file    Non-medical: Not on file  Tobacco Use  . Smoking status: Former Smoker    Packs/day: 2.00    Years: 30.00    Pack years: 60.00    Types: Cigarettes    Quit date: 10/16/2007    Years since quitting: 11.6  . Smokeless tobacco: Never Used  Substance and Sexual Activity  . Alcohol use: No  . Drug use: No  . Sexual activity: Not Currently  Lifestyle  . Physical activity    Days per week: Not on file    Minutes per session: Not on file  . Stress: Not on file  Relationships  . Social Herbalist on phone: Not on file    Gets together: Not on file    Attends religious service: Not on file    Active member of club or organization: Not on file    Attends meetings of clubs or organizations: Not on file    Relationship status: Not on file  Other Topics Concern  . Not on file  Social History Narrative   Lives alone.   Allergies  Allergen Reactions  . Amiodarone Other (See Comments)    hyperthyroidism  . Statins Other (See Comments)    myalgia  . Tape Other (See Comments)    Skin Tears Use Paper Tape Only   Family History  Problem Relation Age of Onset  . COPD Mother   . Asthma Mother   . Colon polyps Mother   . Allergies Mother   . Hypothyroidism Mother   . Asthma Maternal Grandmother   . Colon cancer Neg Hx     Current Outpatient Medications (Endocrine & Metabolic):  .  predniSONE (DELTASONE) 10 MG tablet, 3 tabs by mouth per day for 3 days,2tabs per day for 3 days,1tab per day for 3 days   Current Outpatient Medications (Cardiovascular):  .  carvedilol (COREG) 12.5 MG tablet, TAKE 1 TABLET (12.5 MG TOTAL) BY MOUTH 2 (TWO) TIMES DAILY WITH A MEAL. [name]  digoxin (LANOXIN) 0.25 MG tablet, TAKE 1/2 TABLET BY MOUTH EVERY DAY .  ezetimibe (ZETIA) 10 MG tablet, Take 1 tablet (10 mg total) by mouth daily. [name]  spironolactone (ALDACTONE) 25 MG tablet, TAKE 1 TABLET BY MOUTH EVERY DAY .  torsemide (DEMADEX) 20 MG tablet, Take 40 mg by mouth daily. Take 2 tabs (40 mg) in the morning and 1 tab (20 mg) in the evening.  Current Outpatient Medications (Respiratory):  .  albuterol (VENTOLIN HFA) 108 (90 Base) MCG/ACT inhaler, INHALE 2 PUFFS 4 TIMES A DAY as needed .  fluticasone (FLONASE) 50 MCG/ACT nasal spray, Place 2 sprays into both nostrils daily as needed for allergies or rhinitis. .  Fluticasone-Salmeterol (ADVAIR DISKUS) 250-50 MCG/DOSE AEPB, Inhale 1 puff into the lungs 2 (two) times daily.  Current Outpatient Medications (Analgesics):  .  acetaminophen (TYLENOL) 500 MG tablet, Take 500 mg by mouth every 6 (six) hours as needed. .  traMADol (ULTRAM) 50 MG tablet, TAKE 1 TABLET (50 MG TOTAL) BY MOUTH EVERY 6 (SIX) HOURS AS NEEDED.  Current Outpatient Medications (Hematological):  .  apixaban (ELIQUIS) 5 MG TABS tablet, Take 1 tablet (5 mg total) by mouth 2 (two) times daily. [name]  warfarin (COUMADIN) 5 MG tablet, Take 1 tablet daily except 1/2 tablet on Monday and Friday or Take as directed by anticoagulation clinic.  Current Outpatient Medications (Other):  [name]  ALPRAZolam (XANAX) 0.5 MG tablet, TAKE 2 TABLETS BY MOUTH AT BEDTIME .  carisoprodol (SOMA) 350 MG tablet, TAKE 1 TABLET BY MOUTH 3 TIMES A DAY AS NEEDED FOR MUSCLE SPASMS .  cephALEXin (KEFLEX) 500 MG capsule, Take 1 capsule (500 mg total) by mouth 2 (two) times daily. .  clobetasol ointment (TEMOVATE) [id number] %, Apply 1 application topically 2 (two) times daily. [name]  doxepin (SINEQUAN) 10 MG  capsule, TAKE 2 CAPSULES BY MOUTH AT BEDTIME.  ANNUAL APPT DUE IN JUNE MUST SEE PROVIDER FOR FUTURE REFILLS .  hydrocortisone 2.5 % cream, APPLY TO AFFECTED AREA TWICE A DAY .  hydrOXYzine (ATARAX/VISTARIL) 10 MG tablet, TAKE 1 TABLET BY MOUTH THREE TIMES A DAY AS NEEDED .  ketoconazole (NIZORAL) 2 % cream, APPLY TO AFFECTED AREA EVERY DAY .  lidocaine (LIDODERM) 5 %, APPLY 1 PATCH TO AFFECTED AREA FOR UP TO 12 HOURS . DO NOT USE MORE THAN ONE PATCH IN 24 HOURS. .  methotrexate (RHEUMATREX) 2.5 MG tablet, TAKE 4 TABLETS BY MOUTH EVERY WEEK .  omeprazole (PRILOSEC) 20 MG capsule, Take 1 capsule (20 mg total) by mouth daily with supper. [name]  omeprazole (PRILOSEC) 20 MG capsule, TAKE 1 CAPSULE BY MOUTH TWICE A DAY .  sertraline (ZOLOFT) 100 MG tablet, TAKE 2 TABLETS BY MOUTH AT BEDTIME .  silver sulfADIAZINE (SILVADENE) 1 % cream, APPLY TOPICALLY TO AFFECTED AREA EVERY DAY .  sulfamethoxazole-trimethoprim (BACTRIM DS,SEPTRA DS) 800-160 MG tablet, Take 1 tablet by mouth 2 (two) times daily. .  traZODone (DESYREL) 100 MG tablet, TAKE 1/2 TABLET BY MOUTH EVERY DAY AT BEDTIME AS NEEDED .  traZODone (DESYREL) 50 MG tablet, TAKE 1 TABLET (50 MG TOTAL) BY MOUTH AT BEDTIME AS NEEDED FOR SLEEP. [name]  triamcinolone (KENALOG) 0.1 % paste, Use as directed 1 application in the mouth or throat 2 (two) times daily. .  TUMS 500 MG chewable tablet, Chew 500-2,000 mg by mouth every 4 (four) hours as needed for indigestion or heartburn.  .  valACYclovir (VALTREX) 1000 MG tablet, TAKE 1 TABLET BY MOUTH THREE TIMES A DAY .  venlafaxine XR (EFFEXOR-XR) 37.5 MG 24 hr capsule, TAKE ONE CAPSULE BY MOUTH EVERY DAY WITH BREAKFAST .  zolpidem (AMBIEN) 10 MG tablet, TAKE 1 TABLET BY MOUTH EVERYDAY AT BEDTIME    Past medical history, social, surgical and family history all reviewed in electronic medical record.  No pertanent information unless stated regarding to the chief complaint.   Review of Systems:  No headache, visual changes, nausea, vomiting, diarrhea, constipation,  dizziness, abdominal pain, skin rash, fevers, chills, night sweats, weight loss, swollen lymph nodes, , chest pain, shortness of breath, mood changes.  Positive muscle aches, body aches  Objective  Blood pressure 100/70, pulse 76, height 5\' 10"  (1.778 m), weight 223 lb (101.2 kg), SpO2 97 %.    General: No apparent distress alert and oriented x3 mood and affect normal, dressed appropriately.  HEENT: Pupils equal, extraocular movements intact  Respiratory: Patient's speak in full sentences and does not appear short of breath  Cardiovascular: No lower extremity edema, non tender, no erythema  Skin: Warm dry intact with no signs of infection or rash on extremities or on axial skeleton.  Abdomen: Soft nontender  Neuro: Cranial nerves II through XII are intact, neurovascularly intact in all extremities with 2+ DTRs and 2+ pulses.  Lymph: No lymphadenopathy of posterior or anterior cervical chain or axillae bilaterally.  Gait antalgic favoring the left knee MSK:   Knee: Left valgus deformity noted. Large thigh to calf ratio.  Tender to palpation over medial and PF joint line.  ROM full in flexion and extension and lower leg rotation. instability with valgus force.  painful patellar compression. Patellar glide with moderate crepitus. Patellar and quadriceps tendons unremarkable. Hamstring and quadriceps strength is normal. Contralateral knee shows mild to moderate  Bilateral shoulders show some atrophy noted.  Patient does have crepitus bilaterally.  3+  out of 5 strength of the rotator cuff bilaterally  Procedure: Real-time Ultrasound Guided Injection of right glenohumeral joint Device: GE Logiq Q7  Ultrasound guided injection is preferred based studies that show increased duration, increased effect, greater accuracy, decreased procedural pain, increased response rate with ultrasound guided versus blind injection.  Verbal informed consent obtained.  Time-out conducted.  Noted no overlying  erythema, induration, or other signs of local infection.  Skin prepped in a sterile fashion.  Local anesthesia: Topical Ethyl chloride.  With sterile technique and under real time ultrasound guidance:  Joint visualized.  23g 1  inch needle inserted posterior approach. Pictures taken for needle placement. Patient did have injection of 2 cc of 1% lidocaine, 2 cc of 0.5% Marcaine, and 1.0 cc of Kenalog 40 mg/dL. Completed without difficulty  Pain immediately resolved suggesting accurate placement of the medication.  Advised to call if fevers/chills, erythema, induration, drainage, or persistent bleeding.  Images permanently stored and available for review in the ultrasound unit.  Impression: Technically successful ultrasound guided injection.  Procedure: Real-time Ultrasound Guided Injection of left glenohumeral joint Device: GE Logiq E  Ultrasound guided injection is preferred based studies that show increased duration, increased effect, greater accuracy, decreased procedural pain, increased response rate with ultrasound guided versus blind injection.  Verbal informed consent obtained.  Time-out conducted.  Noted no overlying erythema, induration, or other signs of local infection.  Skin prepped in a sterile fashion.  Local anesthesia: Topical Ethyl chloride.  With sterile technique and under real time ultrasound guidance:  Joint visualized.  21g 2 inch needle inserted posterior approach. Pictures taken for needle placement. Patient did have injection of 2 cc of 0.5% Marcaine, and 1cc of Kenalog 40 mg/dL. Completed without difficulty  Pain immediately resolved suggesting accurate placement of the medication.  Advised to call if fevers/chills, erythema, induration, drainage, or persistent bleeding.  Images permanently stored and available for review in the ultrasound unit.  Impression: Technically successful ultrasound guided injection.  Procedure: Real-time Ultrasound Guided Injection of left  knee Device: GE Logiq Q7 Ultrasound guided injection is preferred based studies that show increased duration, increased effect, greater accuracy, decreased procedural pain, increased response rate, and decreased cost with ultrasound guided versus blind injection.  Verbal informed consent obtained.  Time-out conducted.  Noted no overlying erythema, induration, or other signs of local infection.  Skin prepped in a sterile fashion.  Local anesthesia: Topical Ethyl chloride.  With sterile technique and under real time ultrasound guidance: With a 22-gauge 2 inch needle patient was injected with 4 cc of 0.5% Marcaine and aspirated 25 cc of straw-colored fluid then injected 1 cc of Kenalog 40 mg/dL. This was from a posterior approach approach.  Completed without difficulty  Pain immediately resolved suggesting accurate placement of the medication.  Advised to call if fevers/chills, erythema, induration, drainage, or persistent bleeding.  Images permanently stored and available for review in the ultrasound unit.  Impression: Technically successful ultrasound guided injection.    Impression and Recommendations:     This case required medical decision making of moderate complexity. The above documentation has been reviewed and is accurate and complete Lyndal Pulley, DO       Note: This dictation was prepared with Dragon dictation along with smaller phrase technology. Any transcriptional errors that result from this process are unintentional.

## 2019-06-17 NOTE — Assessment & Plan Note (Signed)
Patient given injection.  Tolerated the procedure well.  Discussed icing regimen and home exercises, which activities of doing which wants to avoid.  Can repeat injection every 10 weeks if needed

## 2019-06-17 NOTE — Patient Instructions (Addendum)
Good to see you Call [name] for the brace Follow up in 2 months

## 2019-06-17 NOTE — Assessment & Plan Note (Signed)
Aspiration done today.  Discussed posture and ergonomics.  Discussed which activities to do which wants to avoid.  Encourage patient to wear the custom brace.  Follow-up again in 10 weeks

## 2019-06-18 ENCOUNTER — Ambulatory Visit (INDEPENDENT_AMBULATORY_CARE_PROVIDER_SITE_OTHER): Payer: Medicare HMO | Admitting: *Deleted

## 2019-06-18 DIAGNOSIS — I428 Other cardiomyopathies: Secondary | ICD-10-CM | POA: Diagnosis not present

## 2019-06-18 LAB — CUP PACEART REMOTE DEVICE CHECK
Battery Remaining Longevity: 71 mo
Battery Voltage: 2.99 V
Brady Statistic AP VP Percent: 0 %
Brady Statistic AP VS Percent: 0 %
Brady Statistic AS VP Percent: 0 %
Brady Statistic AS VS Percent: 0 %
Brady Statistic RA Percent Paced: 0 %
Brady Statistic RV Percent Paced: 99.96 %
Date Time Interrogation Session: 20200903062724
HighPow Impedance: 75 Ohm
Implantable Lead Implant Date: 20181205
Implantable Lead Implant Date: 20181205
Implantable Lead Location: 753858
Implantable Lead Location: 753860
Implantable Lead Model: 4398
Implantable Pulse Generator Implant Date: 20181205
Lead Channel Impedance Value: 1007 Ohm
Lead Channel Impedance Value: 1064 Ohm
Lead Channel Impedance Value: 1197 Ohm
Lead Channel Impedance Value: 1197 Ohm
Lead Channel Impedance Value: 234.08 Ohm
Lead Channel Impedance Value: 269.677
Lead Channel Impedance Value: 276.523
Lead Channel Impedance Value: 312.941
Lead Channel Impedance Value: 322.197
Lead Channel Impedance Value: 361 Ohm
Lead Channel Impedance Value: 4047 Ohm
Lead Channel Impedance Value: 418 Ohm
Lead Channel Impedance Value: 475 Ohm
Lead Channel Impedance Value: 532 Ohm
Lead Channel Impedance Value: 760 Ohm
Lead Channel Impedance Value: 817 Ohm
Lead Channel Impedance Value: 874 Ohm
Lead Channel Impedance Value: 893 Ohm
Lead Channel Pacing Threshold Amplitude: 0.5 V
Lead Channel Pacing Threshold Pulse Width: 0.4 ms
Lead Channel Sensing Intrinsic Amplitude: 12.5 mV
Lead Channel Sensing Intrinsic Amplitude: 12.5 mV
Lead Channel Setting Pacing Amplitude: 2 V
Lead Channel Setting Pacing Amplitude: 2.5 V
Lead Channel Setting Pacing Pulse Width: 0.4 ms
Lead Channel Setting Pacing Pulse Width: 0.6 ms
Lead Channel Setting Sensing Sensitivity: 0.3 mV

## 2019-07-02 ENCOUNTER — Encounter: Payer: Self-pay | Admitting: Cardiology

## 2019-07-02 NOTE — Progress Notes (Signed)
Remote ICD transmission.   

## 2019-07-13 ENCOUNTER — Other Ambulatory Visit: Payer: Self-pay | Admitting: Internal Medicine

## 2019-07-13 MED ORDER — LIDOCAINE 5 % EX PTCH: MEDICATED_PATCH | CUTANEOUS | 1 refills | Status: DC

## 2019-07-13 NOTE — Telephone Encounter (Signed)
Requested medication (s) are due for refill today: yes  Requested medication (s) are on the active medication list: yes  Last refill:  12/30/2018  Future visit scheduled: yes  Notes to clinic:  Review for refill   Requested Prescriptions  Pending Prescriptions Disp Refills   lidocaine (LIDODERM) 5 % 90 patch 1    Sig: APPLY 1 PATCH TO AFFECTED AREA FOR UP TO 12 HOURS . DO NOT USE MORE THAN ONE PATCH IN 24 HOURS.     Analgesics:  Topicals Passed - 07/13/2019  8:43 AM      Passed - Valid encounter within last 12 months    Recent Outpatient Visits          3 weeks ago Chronic pain of left knee   [place], [place], DO   2 months ago Primary osteoarthritis of left knee   [place], [place], DO   2 months ago Primary osteoarthritis of left knee   [place], Olevia Bowens, DO   3 months ago Preventative health care   [facility] -Georges Mouse, MD   4 months ago Chronic left shoulder pain   [place], [place], DO      Future Appointments            In 1 month Lyndal Pulley, [place], [place]   In 2 months [name], Hunt Oris, MD [place], [facility]

## 2019-07-13 NOTE — Telephone Encounter (Signed)
Medication Refill - Medication: lidocain patches  Has the patient contacted their pharmacy? Yes.   (Agent: If no, request that the patient contact the pharmacy for the refill.) (Agent: If yes, when and what did the pharmacy advise?)  Preferred Pharmacy (with phone number or street name):  [place], [address] [place]  [place] [address]  Phone: [phone] Fax: [phone]  Not a 24 hour pharmacy; exact hours not known.     Agent: Please be advised that RX refills may take up to 3 business days. We ask that you follow-up with your pharmacy.

## 2019-07-19 ENCOUNTER — Other Ambulatory Visit: Payer: Self-pay | Admitting: Cardiology

## 2019-07-20 ENCOUNTER — Ambulatory Visit (INDEPENDENT_AMBULATORY_CARE_PROVIDER_SITE_OTHER): Payer: Medicare HMO

## 2019-07-20 DIAGNOSIS — I5032 Chronic diastolic (congestive) heart failure: Secondary | ICD-10-CM | POA: Diagnosis not present

## 2019-07-20 DIAGNOSIS — Z95810 Presence of automatic (implantable) cardiac defibrillator: Secondary | ICD-10-CM

## 2019-07-20 NOTE — Progress Notes (Signed)
EPIC Encounter for ICM Monitoring  Patient Name: [name] is a 63 y.o. male Date: 07/20/2019 Primary Care Physican: [name], MD Primary Cardiologist:[place] Electrophysiologist:Klein Bi-V Pacing:99.9% 05/01/2019 Weight: 214.8 lbs 07/20/2019 Weight: 220 lbs  Spoke with patient.  Weight has increased by a few pounds the last couple of days.  He has been taking Torsemide differently than prescribed.  He also ate [place] food a few days ago.  Optivol thoracic impedancesuggesting ongoing possible fluid accumulation since 07/13/2019.  Prescribed:Torsemide 20 mg to 2 tablets (40 mg total)twice a day.  Labs: 03/23/2019 Creatinine 1.05, BUN 31, Potassium 4.3, Sodium 141, GFR 71.05 11/24/2018 Creatinine 1.32, BUN 18, Potassium 4.5, Sodium 139, GFR 57->60 01/13/2020Creatinine 1.17, BUN22, Potassium3.9, Sodium139, GFR>60 10/13/2018 Creatinine0.98, BUN17, Potassium4.5, Sodium141, GFR>60 07/14/2018 Creatinine1.32, BUN21, Potassium4.6, Sodium136, GFR56->60 A complete set of results can be found in Results Review.  Recommendations: Advised patient to take Torsemide 40 mg twice a day as prescribed.  He has been taking morning dose but not the evening dose. Advised to avoid foods high in salt such as chinese food.   Follow-up plan: ICM clinic phone appointment on 07/27/2019 to recheck fluid levels.   91 day device clinic remote transmission 09/17/2019.    Copy of ICM check sent to Dr. [name].   3 month ICM trend: 07/20/2019    1 Year ICM trend:       [name], RN 07/20/2019 10:59 AM

## 2019-07-21 ENCOUNTER — Other Ambulatory Visit: Payer: Self-pay | Admitting: Internal Medicine

## 2019-07-24 ENCOUNTER — Other Ambulatory Visit: Payer: Self-pay | Admitting: Internal Medicine

## 2019-07-24 ENCOUNTER — Encounter (HOSPITAL_COMMUNITY): Payer: Medicare HMO | Admitting: Cardiology

## 2019-07-27 ENCOUNTER — Ambulatory Visit (INDEPENDENT_AMBULATORY_CARE_PROVIDER_SITE_OTHER): Payer: Medicare HMO

## 2019-07-27 DIAGNOSIS — I5032 Chronic diastolic (congestive) heart failure: Secondary | ICD-10-CM

## 2019-07-27 DIAGNOSIS — Z95810 Presence of automatic (implantable) cardiac defibrillator: Secondary | ICD-10-CM

## 2019-07-29 NOTE — Progress Notes (Signed)
EPIC Encounter for ICM Monitoring  Patient Name: [name] is a 63 y.o. male Date: 07/29/2019 Primary Care Physican: [name], MD Primary Cardiologist:[place] Electrophysiologist:Klein Bi-V Pacing:99.9% 07/20/2019 Weight: 220 lbs  Attempted call to patient and unable to reach.  Left detailed message per DPR regarding transmission. Transmission reviewed.   Optivol thoracic impedancereturned to baseline normal since last remote transmission 07/20/2019.  Prescribed:Torsemide 20 mg to 2 tablets (40 mg total)twice a day.  Labs: 03/23/2019 Creatinine 1.05, BUN 31, Potassium 4.3, Sodium 141, GFR 71.05 11/24/2018 Creatinine 1.32, BUN 18, Potassium 4.5, Sodium 139, GFR 57->60 01/13/2020Creatinine 1.17, BUN22, Potassium3.9, Sodium139, GFR>60 10/13/2018 Creatinine0.98, BUN17, Potassium4.5, Sodium141, GFR>60 07/14/2018 Creatinine1.32, BUN21, Potassium4.6, Sodium136, GFR56->60 A complete set of results can be found in Results Review.  Recommendations: Left voice mail with ICM number and encouraged to call if experiencing any fluid symptoms.  Follow-up plan: ICM clinic phone appointment on 08/24/2019.   91 day device clinic remote transmission 09/17/2019.    Copy of ICM check sent to Dr. [name].   3 month ICM trend: 07/27/2019    1 Year ICM trend:       [name], RN 07/29/2019 1:59 PM

## 2019-07-31 ENCOUNTER — Other Ambulatory Visit: Payer: Self-pay | Admitting: Internal Medicine

## 2019-07-31 NOTE — Telephone Encounter (Signed)
Done erx 

## 2019-08-05 ENCOUNTER — Other Ambulatory Visit: Payer: Self-pay | Admitting: Family Medicine

## 2019-08-06 ENCOUNTER — Ambulatory Visit: Payer: Self-pay

## 2019-08-06 ENCOUNTER — Ambulatory Visit (INDEPENDENT_AMBULATORY_CARE_PROVIDER_SITE_OTHER): Payer: Medicare HMO | Admitting: Family Medicine

## 2019-08-06 ENCOUNTER — Encounter: Payer: Self-pay | Admitting: Family Medicine

## 2019-08-06 ENCOUNTER — Other Ambulatory Visit: Payer: Self-pay

## 2019-08-06 VITALS — BP 110/78 | HR 72 | Ht 70.0 in | Wt 227.0 lb

## 2019-08-06 DIAGNOSIS — M1712 Unilateral primary osteoarthritis, left knee: Secondary | ICD-10-CM | POA: Diagnosis not present

## 2019-08-06 DIAGNOSIS — M75101 Unspecified rotator cuff tear or rupture of right shoulder, not specified as traumatic: Secondary | ICD-10-CM

## 2019-08-06 DIAGNOSIS — G8929 Other chronic pain: Secondary | ICD-10-CM | POA: Diagnosis not present

## 2019-08-06 DIAGNOSIS — M12811 Other specific arthropathies, not elsewhere classified, right shoulder: Secondary | ICD-10-CM

## 2019-08-06 DIAGNOSIS — M12812 Other specific arthropathies, not elsewhere classified, left shoulder: Secondary | ICD-10-CM

## 2019-08-06 DIAGNOSIS — M25512 Pain in left shoulder: Secondary | ICD-10-CM

## 2019-08-06 DIAGNOSIS — M75102 Unspecified rotator cuff tear or rupture of left shoulder, not specified as traumatic: Secondary | ICD-10-CM

## 2019-08-06 NOTE — Progress Notes (Signed)
[name] [address] [place], [place] [address] Phone: [phone] Subjective:   [name], am serving as a scribe for Dr. Hulan Saas.  I'm seeing this patient by the request  of:    CC:   [id number]    06/17/19: L knee: Aspiration done today.  Discussed posture and ergonomics.  Discussed which activities to do which wants to avoid.  Encourage patient to wear the custom brace.  Follow-up again in 10 weeks  L shoulder: Patient given injection.  Tolerated the procedure well.  Discussed icing regimen and home exercises, which activities of doing which wants to avoid.  Can repeat injection every 10 weeks if needed  R shoulder: Patient given injection and tolerated the procedure well.  Discussed icing regimen and home exercise, which activities of doing which wants to avoid.  Increase activity as tolerated.  Follow-up again in 4 to 6 weeks  Update- 08/06/19 [name] is a 63 y.o. male coming in with complaint of left knee, shoulder and right shoulder pain. Patient experiencing constant pain in knee. Using hinged knee brace for relief. Has had injection previously to help manage pain. Would like one today.  Also having left shoulder pain. Wonders if he is going to have to have surgery. Would like an injection today to manage his pain.  Patient has had pain in both shoulders though more than usual.  Concerning symptoms can wake him up at night.  Considering the possibility of surgical intervention    Past Medical History:  Diagnosis Date  . AICD (automatic cardioverter/defibrillator) present   . Anemia    supposed to be taking Vit B but doesn't  . ANXIETY    takes Xanax nightly  . Arthritis   . Asthma    Albuterol prn and Advair daily;also takes Prednisone daily  . Atrial fibrillation ([facility]) 09/06/2015  . Cardiomyopathy [facility])    a. EF 25% TEE July 2013; b. EF normalized 2015;  c. 03/2015 Echo: EF 40-45%, difrf HK, PASP 38 mmHg, Mild  MR, sev LAE/RAE.  [name] Chronic constipation    takes OTC stool softener  . COPD (chronic obstructive pulmonary disease) ([place])    "one dr says COPD; one dr says emphysema" (09/18/2017)  . DEPRESSION    takes Zoloft and Doxepin daily  . Diverticulitis   . DYSKINESIA, ESOPHAGUS   . Emphysema of lung [facility]    "one dr says COPD; one dr says emphysema" (09/18/2017)  . Essential hypertension       . FIBROMYALGIA   . GERD (gastroesophageal reflux disease)       . Glaucoma   . HYPERLIPIDEMIA    a. Intolerant to statins.  . INSOMNIA    takes Ambien nightly  . Myocardial infarction [facility])    a. 2012 Myoview notable for prior infarct;  b. 03/2015 Lexiscan CL: EF 37%, diff HK, small area of inferior infarct from apex to base-->Med Rx.  . Myocardial infarction ([place])   . O2 dependent    "2.5L q hs & prn" (09/18/2017)  . Paroxysmal atrial fibrillation ([facility])    a. CHA2DS2VASc = 3--> takes Coumadin;  b. 03/15/2015 Successful TEE/DCCV;  c. 03/2015 recurrent afib, Amio d/c'd in setting of hyperthyroidism.  . Peripheral neuropathy   . Pneumonia 12/2016  . Rash and other nonspecific skin eruption 04/12/2009   no cause found saw dermatologists x 2 and allergist  . SLEEP APNEA, OBSTRUCTIVE    a. doesn't use CPAP  . Syncope  a. 03/2015 s/p MDT LINQ.  [name] Type II diabetes mellitus ([place])        Past Surgical History:  Procedure Laterality Date  . ACNE CYST REMOVAL     2 on back   . AV NODE ABLATION N/A 10/25/2017   Procedure: AV NODE ABLATION;  Surgeon: Deboraha Sprang, MD;  Location: [place] CV LAB;  Service: Cardiovascular;  Laterality: N/A;  . BIV ICD INSERTION CRT-D N/A 09/18/2017   Procedure: BIV ICD INSERTION CRT-D;  Surgeon: Deboraha Sprang, MD;  Location: [place] CV LAB;  Service: Cardiovascular;  Laterality: N/A;  . CARDIAC CATHETERIZATION N/A 03/21/2016   Procedure: Right/Left Heart Cath and Coronary Angiography;  Surgeon: Larey Dresser, MD;  Location: [place] CV LAB;  Service:  Cardiovascular;  Laterality: N/A;  . CARDIOVERSION  04/18/2012   Procedure: CARDIOVERSION;  Surgeon: Fay Records, MD;  Location: [place];  Service: Cardiovascular;  Laterality: N/A;  . CARDIOVERSION  04/25/2012   Procedure: CARDIOVERSION;  Surgeon: Thayer Headings, MD;  Location: [place];  Service: Cardiovascular;  Laterality: N/A;  . CARDIOVERSION  04/25/2012   Procedure: CARDIOVERSION;  Surgeon: Fay Records, MD;  Location: [place];  Service: Cardiovascular;  Laterality: N/A;  . CARDIOVERSION  05/09/2012   Procedure: CARDIOVERSION;  Surgeon: Sherren Mocha, MD;  Location: [place];  Service: Cardiovascular;  Laterality: N/A;  changed from crenshaw to cooper by trish/leone-endo  . CARDIOVERSION N/A 03/15/2015   Procedure: CARDIOVERSION;  Surgeon: Thayer Headings, MD;  Location: [facility] ENDOSCOPY;  Service: Cardiovascular;  Laterality: N/A;  . COLONOSCOPY    . COLONOSCOPY WITH PROPOFOL N/A 10/21/2014   Procedure: COLONOSCOPY WITH PROPOFOL;  Surgeon: Ladene Artist, MD;  Location: [facility] ENDOSCOPY;  Service: Endoscopy;  Laterality: N/A;  . EP IMPLANTABLE DEVICE N/A 04/06/2015   Procedure: Loop Recorder Insertion;  Surgeon: Evans Lance, MD;  Location: [place] CV LAB;  Service: Cardiovascular;  Laterality: N/A;  . ESOPHAGOGASTRODUODENOSCOPY    . JOINT REPLACEMENT    . LOOP RECORDER REMOVAL N/A 09/18/2017   Procedure: LOOP RECORDER REMOVAL;  Surgeon: Deboraha Sprang, MD;  Location: [place] CV LAB;  Service: Cardiovascular;  Laterality: N/A;  . RIGHT/LEFT HEART CATH AND CORONARY ANGIOGRAPHY N/A 01/28/2017   Procedure: Right/Left Heart Cath and Coronary Angiography;  Surgeon: Larey Dresser, MD;  Location: [place] CV LAB;  Service: Cardiovascular;  Laterality: N/A;  . TEE WITHOUT CARDIOVERSION  04/25/2012   Procedure: TRANSESOPHAGEAL ECHOCARDIOGRAM (TEE);  Surgeon: Thayer Headings, MD;  Location: [place];  Service: Cardiovascular;  Laterality: N/A;  . TEE WITHOUT CARDIOVERSION N/A 03/15/2015    Procedure: TRANSESOPHAGEAL ECHOCARDIOGRAM (TEE);  Surgeon: Thayer Headings, MD;  Location: [place];  Service: Cardiovascular;  Laterality: N/A;  . TONSILLECTOMY AND ADENOIDECTOMY    . TOTAL KNEE ARTHROPLASTY Right 06/15/2014   Procedure: TOTAL KNEE ARTHROPLASTY;  Surgeon: Renette Butters, MD;  Location: [place];  Service: Orthopedics;  Laterality: Right;   Social History   Socioeconomic History  . Marital status: Divorced    Spouse name: Not on file  . Number of children: 2  . Years of education: Not on file  . Highest education level: Not on file  Occupational History  . Occupation: retired/disabled. prev worked in Therapist, sports.    Employer: DISABLED  Social Needs  . Financial resource strain: Not on file  . Food insecurity    Worry: Not on file    Inability: Not on file  . Transportation  needs    Medical: Not on file    Non-medical: Not on file  Tobacco Use  . Smoking status: Former Smoker    Packs/day: 2.00    Years: 30.00    Pack years: 60.00    Types: Cigarettes    Quit date: 10/16/2007    Years since quitting: 11.8  . Smokeless tobacco: Never Used  Substance and Sexual Activity  . Alcohol use: No  . Drug use: No  . Sexual activity: Not Currently  Lifestyle  . Physical activity    Days per week: Not on file    Minutes per session: Not on file  . Stress: Not on file  Relationships  . Social Herbalist on phone: Not on file    Gets together: Not on file    Attends religious service: Not on file    Active member of club or organization: Not on file    Attends meetings of clubs or organizations: Not on file    Relationship status: Not on file  Other Topics Concern  . Not on file  Social History Narrative   Lives alone.   Allergies  Allergen Reactions  . Amiodarone Other (See Comments)    hyperthyroidism  . Statins Other (See Comments)    myalgia  . Tape Other (See Comments)    Skin Tears Use Paper Tape Only   Family History  Problem  Relation Age of Onset  . COPD Mother   . Asthma Mother   . Colon polyps Mother   . Allergies Mother   . Hypothyroidism Mother   . Asthma Maternal Grandmother   . Colon cancer Neg Hx     Current Outpatient Medications (Endocrine & Metabolic):  .  predniSONE (DELTASONE) 10 MG tablet, 3 tabs by mouth per day for 3 days,2tabs per day for 3 days,1tab per day for 3 days  Current Outpatient Medications (Cardiovascular):  .  carvedilol (COREG) 12.5 MG tablet, TAKE 1 TABLET (12.5 MG TOTAL) BY MOUTH 2 (TWO) TIMES DAILY WITH A MEAL. [name]  digoxin (LANOXIN) 0.25 MG tablet, TAKE 1/2 TABLET BY MOUTH EVERY DAY .  ezetimibe (ZETIA) 10 MG tablet, Take 1 tablet (10 mg total) by mouth daily. [name]  spironolactone (ALDACTONE) 25 MG tablet, TAKE 1 TABLET BY MOUTH EVERY DAY .  torsemide (DEMADEX) 20 MG tablet, TAKE 2 TABLETS BY MOUTH TWICE A DAY  Current Outpatient Medications (Respiratory):  .  albuterol (VENTOLIN HFA) 108 (90 Base) MCG/ACT inhaler, INHALE 2 PUFFS 4 TIMES A DAY as needed .  fluticasone (FLONASE) 50 MCG/ACT nasal spray, Place 2 sprays into both nostrils daily as needed for allergies or rhinitis. .  Fluticasone-Salmeterol (ADVAIR DISKUS) 250-50 MCG/DOSE AEPB, Inhale 1 puff into the lungs 2 (two) times daily.  Current Outpatient Medications (Analgesics):  .  acetaminophen (TYLENOL) 500 MG tablet, Take 500 mg by mouth every 6 (six) hours as needed. .  traMADol (ULTRAM) 50 MG tablet, TAKE 1 TABLET (50 MG TOTAL) BY MOUTH EVERY 6 (SIX) HOURS AS NEEDED.  Current Outpatient Medications (Hematological):  .  apixaban (ELIQUIS) 5 MG TABS tablet, Take 1 tablet (5 mg total) by mouth 2 (two) times daily. [name]  warfarin (COUMADIN) 5 MG tablet, Take 1 tablet daily except 1/2 tablet on Monday and Friday or Take as directed by anticoagulation clinic.  Current Outpatient Medications (Other):  [name]  ALPRAZolam (XANAX) 0.5 MG tablet, TAKE 2 TABLETS BY MOUTH AT BEDTIME .  carisoprodol (SOMA) 350 MG tablet, TAKE 1 TABLET  BY MOUTH 3 TIMES A DAY AS NEEDED FOR MUSCLE SPASMS .  cephALEXin (KEFLEX) 500 MG capsule, Take 1 capsule (500 mg total) by mouth 2 (two) times daily. .  clobetasol ointment (TEMOVATE) [id number] %, Apply 1 application topically 2 (two) times daily. [name]  doxepin (SINEQUAN) 10 MG capsule, TAKE 2 CAPSULES BY MOUTH AT BEDTIME. ANNUAL APPT DUE IN JUNE MUST SEE PROVIDER FOR FUTURE REFILLS .  hydrocortisone 2.5 % cream, APPLY TO AFFECTED AREA TWICE A DAY .  hydrOXYzine (ATARAX/VISTARIL) 10 MG tablet, TAKE 1 TABLET BY MOUTH THREE TIMES A DAY AS NEEDED .  ketoconazole (NIZORAL) 2 % cream, APPLY TO AFFECTED AREA EVERY DAY .  lidocaine (LIDODERM) 5 %, APPLY 1 PATCH TO AFFECTED AREA FOR UP TO 12 HOURS . DO NOT USE MORE THAN ONE PATCH IN 24 HOURS. .  methotrexate (RHEUMATREX) 2.5 MG tablet, TAKE 4 TABLETS BY MOUTH EVERY WEEK .  omeprazole (PRILOSEC) 20 MG capsule, Take 1 capsule (20 mg total) by mouth daily with supper. [name]  omeprazole (PRILOSEC) 20 MG capsule, TAKE 1 CAPSULE BY MOUTH TWICE A DAY .  sertraline (ZOLOFT) 100 MG tablet, TAKE 2 TABLETS BY MOUTH AT BEDTIME .  silver sulfADIAZINE (SILVADENE) 1 % cream, APPLY TOPICALLY TO AFFECTED AREA EVERY DAY .  sulfamethoxazole-trimethoprim (BACTRIM DS,SEPTRA DS) 800-160 MG tablet, Take 1 tablet by mouth 2 (two) times daily. .  traZODone (DESYREL) 100 MG tablet, TAKE 1/2 TABLET BY MOUTH EVERY DAY AT BEDTIME AS NEEDED .  traZODone (DESYREL) 50 MG tablet, TAKE 1 TABLET (50 MG TOTAL) BY MOUTH AT BEDTIME AS NEEDED FOR SLEEP. [name]  triamcinolone (KENALOG) 0.1 % paste, Use as directed 1 application in the mouth or throat 2 (two) times daily. .  TUMS 500 MG chewable tablet, Chew 500-2,000 mg by mouth every 4 (four) hours as needed for indigestion or heartburn.  .  valACYclovir (VALTREX) 1000 MG tablet, TAKE 1 TABLET BY MOUTH THREE TIMES A DAY .  venlafaxine XR (EFFEXOR-XR) 37.5 MG 24 hr capsule, TAKE ONE CAPSULE BY MOUTH EVERY DAY WITH BREAKFAST .  zolpidem (AMBIEN) 10 MG tablet,  TAKE 1 TABLET BY MOUTH EVERYDAY AT BEDTIME    Past medical history, social, surgical and family history all reviewed in electronic medical record.  No pertanent information unless stated regarding to the chief complaint.   Review of Systems:  No headache, visual changes, nausea, vomiting, diarrhea, constipation, dizziness, abdominal pain, skin rash, fevers, chills, night sweats, weight loss, swollen lymph nodes, body aches, joint swelling,  chest pain, shortness of breath, mood changes.  Positive muscle aches  Objective  Blood pressure 110/78, pulse 72, height 5\' 10"  (1.778 m), weight 227 lb (103 kg), SpO2 96 %.     General: No apparent distress alert and oriented x3 mood and affect normal, dressed appropriately.  HEENT: Pupils equal, extraocular movements intact  Respiratory: Patient's speak in full sentences and does not appear short of breath  Cardiovascular: No lower extremity edema, non tender, no erythema  Skin: Warm dry intact with no signs of infection or rash on extremities or on axial skeleton.  Abdomen: Soft nontender  Neuro: Cranial nerves II through XII are intact, neurovascularly intact in all extremities with 2+ DTRs and 2+ pulses.  Lymph: No lymphadenopathy of posterior or anterior cervical chain or axillae bilaterally.  Gait antalgic wearing a brace on the left knee.  MSK:  Non tender with full range of motion and good stability and symmetric strength and tone of  elbows, wrist, hip  and ankles bilaterally.  Knee: Left valgus deformity noted.  Abnormal thigh to calf ratio.  Tender to palpation over medial and PF joint line.  ROM full in flexion and extension and lower leg rotation. instability with valgus force.  painful patellar compression. Patellar glide with moderate crepitus. Patellar and quadriceps tendons unremarkable. Hamstring and quadriceps strength is normal. Contralateral knee shows mild arthritic  Bilateral shoulder exam still shows some atrophy.  Does  have crepitus.  4-5 strength the rotator cuff, and decreased range of motion in all planes.  Procedure: Real-time Ultrasound Guided Injection of right glenohumeral joint Device: GE Logiq Q7  Ultrasound guided injection is preferred based studies that show increased duration, increased effect, greater accuracy, decreased procedural pain, increased response rate with ultrasound guided versus blind injection.  Verbal informed consent obtained.  Time-out conducted.  Noted no overlying erythema, induration, or other signs of local infection.  Skin prepped in a sterile fashion.  Local anesthesia: Topical Ethyl chloride.  With sterile technique and under real time ultrasound guidance:  Joint visualized.  23g 1  inch needle inserted posterior approach. Pictures taken for needle placement. Patient did have injection of 2 cc of 1% lidocaine, 2 cc of 0.5% Marcaine, and 1.0 cc of Kenalog 40 mg/dL. Completed without difficulty  Pain immediately resolved suggesting accurate placement of the medication.  Advised to call if fevers/chills, erythema, induration, drainage, or persistent bleeding.  Images permanently stored and available for review in the ultrasound unit.  Impression: Technically successful ultrasound guided injection.  Procedure: Real-time Ultrasound Guided Injection of left glenohumeral joint Device: GE Logiq E  Ultrasound guided injection is preferred based studies that show increased duration, increased effect, greater accuracy, decreased procedural pain, increased response rate with ultrasound guided versus blind injection.  Verbal informed consent obtained.  Time-out conducted.  Noted no overlying erythema, induration, or other signs of local infection.  Skin prepped in a sterile fashion.  Local anesthesia: Topical Ethyl chloride.  With sterile technique and under real time ultrasound guidance:  Joint visualized.  21g 2 inch needle inserted posterior approach. Pictures taken for needle  placement. Patient did have injection of 2 cc of 0.5% Marcaine, and 1cc of Kenalog 40 mg/dL. Completed without difficulty  Pain immediately resolved suggesting accurate placement of the medication.  Advised to call if fevers/chills, erythema, induration, drainage, or persistent bleeding.  Images permanently stored and available for review in the ultrasound unit.  Impression: Technically successful ultrasound guided injection.  After informed written and verbal consent, patient was seated on exam table. Left knee was prepped with alcohol swab and utilizing anterolateral approach, patient's left knee space was injected with 4:1  marcaine 0.5%: Kenalog 40mg /dL. Patient tolerated the procedure well without immediate complications.    Impression and Recommendations:     This case required medical decision making of moderate complexity. The above documentation has been reviewed and is accurate and complete Lyndal Pulley, DO       Note: This dictation was prepared with Dragon dictation along with smaller phrase technology. Any transcriptional errors that result from this process are unintentional.

## 2019-08-06 NOTE — Assessment & Plan Note (Signed)
Injection is long, bilateral, worsening, tried to do more painting and had increasing discomfort as well.  Follow-up again in 4 to 8 weeks can repeat every 10 weeks if necessary.  Do not feel advanced imaging would be warranted.

## 2019-08-06 NOTE — Patient Instructions (Signed)
Stay safe See me in 8 weeks

## 2019-08-06 NOTE — Assessment & Plan Note (Signed)
Patient was given another knee injection today.  Tolerated the procedure well.  Increase activity as tolerated.  Follow-up again in 4 to 8 weeks.

## 2019-08-06 NOTE — Assessment & Plan Note (Signed)
Patient had bilateral injections.  Discussed which activities to do which wants to avoid.  Discussed icing regimen and home exercises.  Follow-up again in 10 weeks

## 2019-08-16 ENCOUNTER — Other Ambulatory Visit: Payer: Self-pay | Admitting: Internal Medicine

## 2019-08-16 DIAGNOSIS — I428 Other cardiomyopathies: Secondary | ICD-10-CM

## 2019-08-16 DIAGNOSIS — I255 Ischemic cardiomyopathy: Secondary | ICD-10-CM

## 2019-08-16 DIAGNOSIS — I4819 Other persistent atrial fibrillation: Secondary | ICD-10-CM

## 2019-08-17 NOTE — Telephone Encounter (Signed)
[place] Controlled Database Checked Last filled: 07/19/19 # 60 LOV w/you: 03/23/19 Next appt w/you: 09/23/19

## 2019-08-17 NOTE — Telephone Encounter (Signed)
Done erx 

## 2019-08-19 ENCOUNTER — Ambulatory Visit: Payer: Medicare HMO | Admitting: Family Medicine

## 2019-08-19 DIAGNOSIS — L308 Other specified dermatitis: Secondary | ICD-10-CM | POA: Diagnosis not present

## 2019-08-24 ENCOUNTER — Ambulatory Visit (INDEPENDENT_AMBULATORY_CARE_PROVIDER_SITE_OTHER): Payer: Medicare HMO

## 2019-08-24 DIAGNOSIS — Z95810 Presence of automatic (implantable) cardiac defibrillator: Secondary | ICD-10-CM

## 2019-08-24 DIAGNOSIS — I5032 Chronic diastolic (congestive) heart failure: Secondary | ICD-10-CM

## 2019-08-26 NOTE — Progress Notes (Signed)
EPIC Encounter for ICM Monitoring  Patient Name: [name] is a 63 y.o. male Date: 08/26/2019 Primary Care Physican: [name], MD Primary Cardiologist:[place] Electrophysiologist:Klein Bi-V Pacing:100% 07/20/2019 Weight: 220 lbs  Spoke with patient and he is a symptomatic.   Optivol thoracic impedancenormal.  Prescribed:Torsemide 20 mg to 2 tablets (40 mg total)twice a day.  Labs: 03/23/2019 Creatinine 1.05, BUN 31, Potassium 4.3, Sodium 141, GFR 71.05 11/24/2018 Creatinine 1.32, BUN 18, Potassium 4.5, Sodium 139, GFR 57->60 01/13/2020Creatinine 1.17, BUN22, Potassium3.9, Sodium139, GFR>60 10/13/2018 Creatinine0.98, BUN17, Potassium4.5, Sodium141, GFR>60 07/14/2018 Creatinine1.32, BUN21, Potassium4.6, Sodium136, GFR56->60 A complete set of results can be found in Results Review.  Recommendations: No changes and encouraged to call if experiencing any fluid symptoms.  Follow-up plan: ICM clinic phone appointment on 09/29/2019.   91 day device clinic remote transmission 09/28/2019.    Copy of ICM check sent to Dr. [name].   3 month ICM trend: 08/24/2019    1 Year ICM trend:       [name], RN 08/26/2019 1:30 PM

## 2019-09-03 ENCOUNTER — Other Ambulatory Visit: Payer: Self-pay | Admitting: Internal Medicine

## 2019-09-03 DIAGNOSIS — I255 Ischemic cardiomyopathy: Secondary | ICD-10-CM

## 2019-09-03 DIAGNOSIS — I428 Other cardiomyopathies: Secondary | ICD-10-CM

## 2019-09-03 DIAGNOSIS — I4819 Other persistent atrial fibrillation: Secondary | ICD-10-CM

## 2019-09-04 NOTE — Telephone Encounter (Signed)
Done erx 

## 2019-09-14 ENCOUNTER — Other Ambulatory Visit: Payer: Self-pay | Admitting: Internal Medicine

## 2019-09-18 ENCOUNTER — Telehealth (HOSPITAL_COMMUNITY): Payer: Self-pay | Admitting: *Deleted

## 2019-09-18 NOTE — Telephone Encounter (Signed)
Left VM offering to change office visit to virtual visit due to covid19 precautions. Requested pt return my call to determine if they will keep office visit or change to virtual visit.  

## 2019-09-19 ENCOUNTER — Other Ambulatory Visit: Payer: Self-pay | Admitting: Internal Medicine

## 2019-09-21 NOTE — Telephone Encounter (Signed)
Done erx 

## 2019-09-23 ENCOUNTER — Other Ambulatory Visit (INDEPENDENT_AMBULATORY_CARE_PROVIDER_SITE_OTHER): Payer: Medicare HMO

## 2019-09-23 ENCOUNTER — Encounter: Payer: Self-pay | Admitting: Family Medicine

## 2019-09-23 ENCOUNTER — Ambulatory Visit (INDEPENDENT_AMBULATORY_CARE_PROVIDER_SITE_OTHER): Payer: Medicare HMO | Admitting: Family Medicine

## 2019-09-23 ENCOUNTER — Ambulatory Visit: Payer: Self-pay

## 2019-09-23 ENCOUNTER — Encounter: Payer: Self-pay | Admitting: Internal Medicine

## 2019-09-23 ENCOUNTER — Other Ambulatory Visit: Payer: Self-pay

## 2019-09-23 ENCOUNTER — Ambulatory Visit (INDEPENDENT_AMBULATORY_CARE_PROVIDER_SITE_OTHER): Payer: Medicare HMO | Admitting: Internal Medicine

## 2019-09-23 VITALS — BP 118/80 | HR 71 | Ht 70.0 in | Wt 227.0 lb

## 2019-09-23 DIAGNOSIS — E119 Type 2 diabetes mellitus without complications: Secondary | ICD-10-CM

## 2019-09-23 DIAGNOSIS — E7849 Other hyperlipidemia: Secondary | ICD-10-CM

## 2019-09-23 DIAGNOSIS — I10 Essential (primary) hypertension: Secondary | ICD-10-CM

## 2019-09-23 DIAGNOSIS — M1712 Unilateral primary osteoarthritis, left knee: Secondary | ICD-10-CM

## 2019-09-23 DIAGNOSIS — M25512 Pain in left shoulder: Secondary | ICD-10-CM | POA: Diagnosis not present

## 2019-09-23 DIAGNOSIS — M75102 Unspecified rotator cuff tear or rupture of left shoulder, not specified as traumatic: Secondary | ICD-10-CM

## 2019-09-23 DIAGNOSIS — G8929 Other chronic pain: Secondary | ICD-10-CM | POA: Diagnosis not present

## 2019-09-23 DIAGNOSIS — M12812 Other specific arthropathies, not elsewhere classified, left shoulder: Secondary | ICD-10-CM | POA: Diagnosis not present

## 2019-09-23 DIAGNOSIS — R972 Elevated prostate specific antigen [PSA]: Secondary | ICD-10-CM | POA: Diagnosis not present

## 2019-09-23 LAB — BASIC METABOLIC PANEL
BUN: 26 mg/dL — ABNORMAL HIGH (ref 6–23)
CO2: 29 mEq/L (ref 19–32)
Calcium: 9.2 mg/dL (ref 8.4–10.5)
Chloride: 106 mEq/L (ref 96–112)
Creatinine, Ser: 0.87 mg/dL (ref 0.40–1.50)
GFR: 88.13 mL/min (ref >60.00)
Glucose, Bld: 96 mg/dL (ref 70–99)
Potassium: 3.9 mEq/L (ref 3.5–5.1)
Sodium: 141 mEq/L (ref 135–145)

## 2019-09-23 LAB — PSA: PSA: 2.89 ng/mL (ref 0.10–4.00)

## 2019-09-23 LAB — LIPID PANEL
Cholesterol: 171 mg/dL (ref 0–200)
HDL: 55.5 mg/dL (ref >39.00)
LDL Cholesterol: 94 mg/dL (ref 0–99)
NonHDL: 115.21
Total CHOL/HDL Ratio: 3
Triglycerides: 106 mg/dL (ref 0.0–149.0)
VLDL: 21.2 mg/dL (ref 0.0–40.0)

## 2019-09-23 LAB — HEPATIC FUNCTION PANEL
ALT: 24 U/L (ref 0–53)
AST: 19 U/L (ref 0–37)
Albumin: 3.9 g/dL (ref 3.5–5.2)
Alkaline Phosphatase: 53 U/L (ref 39–117)
Bilirubin, Direct: 0.1 mg/dL (ref 0.0–0.3)
Total Bilirubin: 0.6 mg/dL (ref 0.2–1.2)
Total Protein: 6.3 g/dL (ref 6.0–8.3)

## 2019-09-23 LAB — HEMOGLOBIN A1C: Hgb A1c MFr Bld: 5.2 % (ref 4.6–6.5)

## 2019-09-23 NOTE — Patient Instructions (Addendum)
Please continue all other medications as before, and refills have been done if requested.  Please have the pharmacy call with any other refills you may need.  Please continue your efforts at being more active, low cholesterol diet, and weight control  Please keep your appointments with your specialists as you may have planned  Please go to the LAB in the Basement (turn left off the elevator) for the tests to be done today  You will be contacted by phone if any changes need to be made immediately.  Otherwise, you will receive a letter about your results with an explanation, but please check with MyChart first.  Please remember to sign up for MyChart if you have not done so, as this will be important to you in the future with finding out test results, communicating by private email, and scheduling acute appointments online when needed.  Please return in 6 months, or sooner if needed, with Lab testing done 3-5 days before    

## 2019-09-23 NOTE — Assessment & Plan Note (Signed)
Patient given injection, tolerated procedure well, discussed icing regimen and home exercise, patient will see me again in 10 weeks for further evaluation and treatment.

## 2019-09-23 NOTE — Assessment & Plan Note (Signed)
stable overall by history and exam, recent data reviewed with pt, and pt to continue medical treatment as before,  to f/u any worsening symptoms or concerns  

## 2019-09-23 NOTE — Progress Notes (Signed)
[name] [name], [place] [address] Phone: [phone] Subjective:     CC: Arthritic changes of multiple joints  [id number]   08/06/2019 Injection is long, bilateral, worsening, tried to do more painting and had increasing discomfort as well.  Follow-up again in 4 to 8 weeks can repeat every 10 weeks if necessary.  Do not feel advanced imaging would be warranted.  Patient had bilateral injections.  Discussed which activities to do which wants to avoid.  Discussed icing regimen and home exercises.  Follow-up again in 10 weeks  Update 09/23/2019 [name] is a 63 y.o. male coming in with complaint of left shoulder and left knee pain. Is here for injections in both joints.  Patient has had some difficulty from time to time.  Patient has responded well to the injections.  Seems to be only thing that seems to be beneficial.  Is also wanting to get metatarsal pads for his shoes.      Past Medical History:  Diagnosis Date  . AICD (automatic cardioverter/defibrillator) present   . Anemia    supposed to be taking Vit B but doesn't  . ANXIETY    takes Xanax nightly  . Arthritis   . Asthma    Albuterol prn and Advair daily;also takes Prednisone daily  . Atrial fibrillation ([place]) 09/06/2015  . Cardiomyopathy [facility])    a. EF 25% TEE July 2013; b. EF normalized 2015;  c. 03/2015 Echo: EF 40-45%, difrf HK, PASP 38 mmHg, Mild MR, sev LAE/RAE.  [name] Chronic constipation    takes OTC stool softener  . COPD (chronic obstructive pulmonary disease) ([place])    "one dr says COPD; one dr says emphysema" (09/18/2017)  . DEPRESSION    takes Zoloft and Doxepin daily  . Diverticulitis   . DYSKINESIA, ESOPHAGUS   . Emphysema of lung [facility])    "one dr says COPD; one dr says emphysema" (09/18/2017)  . Essential hypertension       . FIBROMYALGIA   . GERD (gastroesophageal reflux disease)       . Glaucoma   . HYPERLIPIDEMIA    a. Intolerant to statins.   . INSOMNIA    takes Ambien nightly  . Myocardial infarction [facility])    a. 2012 Myoview notable for prior infarct;  b. 03/2015 Lexiscan CL: EF 37%, diff HK, small area of inferior infarct from apex to base-->Med Rx.  . Myocardial infarction ([place])   . O2 dependent    "2.5L q hs & prn" (09/18/2017)  . Paroxysmal atrial fibrillation ([facility])    a. CHA2DS2VASc = 3--> takes Coumadin;  b. 03/15/2015 Successful TEE/DCCV;  c. 03/2015 recurrent afib, Amio d/c'd in setting of hyperthyroidism.  . Peripheral neuropathy   . Pneumonia 12/2016  . Rash and other nonspecific skin eruption 04/12/2009   no cause found saw dermatologists x 2 and allergist  . SLEEP APNEA, OBSTRUCTIVE    a. doesn't use CPAP  . Syncope    a. 03/2015 s/p MDT LINQ.  [name] Type II diabetes mellitus ([place])        Past Surgical History:  Procedure Laterality Date  . ACNE CYST REMOVAL     2 on back   . AV NODE ABLATION N/A 10/25/2017   Procedure: AV NODE ABLATION;  Surgeon: Deboraha Sprang, MD;  Location: [place] [facility];  Service: Cardiovascular;  Laterality: N/A;  . BIV ICD INSERTION CRT-D N/A 09/18/2017   Procedure: BIV ICD INSERTION CRT-D;  Surgeon: Deboraha Sprang, MD;  Location: [place] [facility];  Service: Cardiovascular;  Laterality: N/A;  . CARDIAC CATHETERIZATION N/A 03/21/2016   Procedure: Right/Left Heart Cath and Coronary Angiography;  Surgeon: Larey Dresser, MD;  Location: [place] [facility];  Service: Cardiovascular;  Laterality: N/A;  . CARDIOVERSION  04/18/2012   Procedure: CARDIOVERSION;  Surgeon: Fay Records, MD;  Location: [place];  Service: Cardiovascular;  Laterality: N/A;  . CARDIOVERSION  04/25/2012   Procedure: CARDIOVERSION;  Surgeon: Thayer Headings, MD;  Location: [place];  Service: Cardiovascular;  Laterality: N/A;  . CARDIOVERSION  04/25/2012   Procedure: CARDIOVERSION;  Surgeon: Fay Records, MD;  Location: [place];  Service: Cardiovascular;  Laterality: N/A;  . CARDIOVERSION  05/09/2012   Procedure:  CARDIOVERSION;  Surgeon: Sherren Mocha, MD;  Location: [place];  Service: Cardiovascular;  Laterality: N/A;  changed from crenshaw to cooper by trish/leone-endo  . CARDIOVERSION N/A 03/15/2015   Procedure: CARDIOVERSION;  Surgeon: Thayer Headings, MD;  Location: [facility] ENDOSCOPY;  Service: Cardiovascular;  Laterality: N/A;  . COLONOSCOPY    . COLONOSCOPY WITH PROPOFOL N/A 10/21/2014   Procedure: COLONOSCOPY WITH PROPOFOL;  Surgeon: Ladene Artist, MD;  Location: [facility] ENDOSCOPY;  Service: Endoscopy;  Laterality: N/A;  . EP IMPLANTABLE DEVICE N/A 04/06/2015   Procedure: Loop Recorder Insertion;  Surgeon: Evans Lance, MD;  Location: [place] [facility];  Service: Cardiovascular;  Laterality: N/A;  . ESOPHAGOGASTRODUODENOSCOPY    . JOINT REPLACEMENT    . LOOP RECORDER REMOVAL N/A 09/18/2017   Procedure: LOOP RECORDER REMOVAL;  Surgeon: Deboraha Sprang, MD;  Location: [place] [facility];  Service: Cardiovascular;  Laterality: N/A;  . RIGHT/LEFT HEART CATH AND CORONARY ANGIOGRAPHY N/A 01/28/2017   Procedure: Right/Left Heart Cath and Coronary Angiography;  Surgeon: Larey Dresser, MD;  Location: [place] [facility];  Service: Cardiovascular;  Laterality: N/A;  . TEE WITHOUT CARDIOVERSION  04/25/2012   Procedure: TRANSESOPHAGEAL ECHOCARDIOGRAM (TEE);  Surgeon: Thayer Headings, MD;  Location: [place];  Service: Cardiovascular;  Laterality: N/A;  . TEE WITHOUT CARDIOVERSION N/A 03/15/2015   Procedure: TRANSESOPHAGEAL ECHOCARDIOGRAM (TEE);  Surgeon: Thayer Headings, MD;  Location: [place];  Service: Cardiovascular;  Laterality: N/A;  . TONSILLECTOMY AND ADENOIDECTOMY    . TOTAL KNEE ARTHROPLASTY Right 06/15/2014   Procedure: TOTAL KNEE ARTHROPLASTY;  Surgeon: Renette Butters, MD;  Location: [place];  Service: Orthopedics;  Laterality: Right;   Social History   Socioeconomic History  . Marital status: Divorced    Spouse name: Not on file  . Number of children: 2  . Years of education: Not on file  .  Highest education level: Not on file  Occupational History  . Occupation: retired/disabled. prev worked in Therapist, sports.    Employer: DISABLED  Social Needs  . Financial resource strain: Not on file  . Food insecurity    Worry: Not on file    Inability: Not on file  . Transportation needs    Medical: Not on file    Non-medical: Not on file  Tobacco Use  . Smoking status: Former Smoker    Packs/day: 2.00    Years: 30.00    Pack years: 60.00    Types: Cigarettes    Quit date: 10/16/2007    Years since quitting: 11.9  . Smokeless tobacco: Never Used  Substance and Sexual Activity  . Alcohol use: No  . Drug use: No  . Sexual activity: Not Currently  Lifestyle  . Physical activity    Days per week: Not on file    Minutes per session: Not on file  . Stress: Not on file  Relationships  . Social Herbalist on phone: Not on file    Gets together: Not on file    Attends religious service: Not on file    Active member of club or organization: Not on file    Attends meetings of clubs or organizations: Not on file    Relationship status: Not on file  Other Topics Concern  . Not on file  Social History Narrative   Lives alone.   Allergies  Allergen Reactions  . Amiodarone Other (See Comments)    hyperthyroidism  . Statins Other (See Comments)    myalgia  . Tape Other (See Comments)    Skin Tears Use Paper Tape Only   Family History  Problem Relation Age of Onset  . COPD Mother   . Asthma Mother   . Colon polyps Mother   . Allergies Mother   . Hypothyroidism Mother   . Asthma Maternal Grandmother   . Colon cancer Neg Hx     Current Outpatient Medications (Endocrine & Metabolic):  .  predniSONE (DELTASONE) 10 MG tablet, 3 tabs by mouth per day for 3 days,2tabs per day for 3 days,1tab per day for 3 days  Current Outpatient Medications (Cardiovascular):  .  carvedilol (COREG) 12.5 MG tablet, TAKE 1 TABLET (12.5 MG TOTAL) BY MOUTH 2 (TWO) TIMES DAILY WITH  A MEAL. [name]  digoxin (LANOXIN) 0.25 MG tablet, TAKE 1/2 TABLET BY MOUTH EVERY DAY .  ezetimibe (ZETIA) 10 MG tablet, Take 1 tablet (10 mg total) by mouth daily. [name]  spironolactone (ALDACTONE) 25 MG tablet, TAKE 1 TABLET BY MOUTH EVERY DAY .  torsemide (DEMADEX) 20 MG tablet, TAKE 2 TABLETS BY MOUTH TWICE A DAY  Current Outpatient Medications (Respiratory):  .  albuterol (VENTOLIN HFA) 108 (90 Base) MCG/ACT inhaler, INHALE 2 PUFFS 4 TIMES A DAY as needed .  fluticasone (FLONASE) 50 MCG/ACT nasal spray, Place 2 sprays into both nostrils daily as needed for allergies or rhinitis. .  Fluticasone-Salmeterol (ADVAIR DISKUS) 250-50 MCG/DOSE AEPB, Inhale 1 puff into the lungs 2 (two) times daily.  Current Outpatient Medications (Analgesics):  .  acetaminophen (TYLENOL) 500 MG tablet, Take 500 mg by mouth every 6 (six) hours as needed. .  traMADol (ULTRAM) 50 MG tablet, TAKE 1 TABLET (50 MG TOTAL) BY MOUTH EVERY 6 (SIX) HOURS AS NEEDED.  Current Outpatient Medications (Hematological):  .  apixaban (ELIQUIS) 5 MG TABS tablet, Take 1 tablet (5 mg total) by mouth 2 (two) times daily. [name]  warfarin (COUMADIN) 5 MG tablet, Take 1 tablet daily except 1/2 tablet on Monday and Friday or Take as directed by anticoagulation clinic.  Current Outpatient Medications (Other):  [name]  ALPRAZolam (XANAX) 0.5 MG tablet, TAKE 2 TABLETS BY MOUTH AT BEDTIME .  carisoprodol (SOMA) 350 MG tablet, TAKE 1 TABLET BY MOUTH THREE TIMES A DAY AS NEEDED FOR MUSCLE SPASMS .  cephALEXin (KEFLEX) 500 MG capsule, Take 1 capsule (500 mg total) by mouth 2 (two) times daily. .  clobetasol ointment (TEMOVATE) [id number] %, Apply 1 application topically 2 (two) times daily. [name]  doxepin (SINEQUAN) 10 MG capsule, TAKE 2 CAPSULES BY MOUTH AT BEDTIME. ANNUAL APPT DUE IN JUNE MUST SEE PROVIDER FOR FUTURE REFILLS .  hydrocortisone 2.5 % cream, APPLY TO AFFECTED AREA TWICE A DAY .  hydrOXYzine (  ATARAX/VISTARIL) 10 MG tablet, TAKE 1 TABLET BY MOUTH THREE TIMES  A DAY AS NEEDED .  ketoconazole (NIZORAL) 2 % cream, APPLY TO AFFECTED AREA EVERY DAY .  lidocaine (LIDODERM) 5 %, APPLY 1 PATCH TO AFFECTED AREA FOR UP TO 12 HOURS . DO NOT USE MORE THAN ONE PATCH IN 24 HOURS. .  methotrexate (RHEUMATREX) 2.5 MG tablet, TAKE 4 TABLETS BY MOUTH EVERY WEEK .  omeprazole (PRILOSEC) 20 MG capsule, Take 1 capsule (20 mg total) by mouth daily with supper. [name]  omeprazole (PRILOSEC) 20 MG capsule, TAKE 1 CAPSULE BY MOUTH TWICE A DAY .  sertraline (ZOLOFT) 100 MG tablet, TAKE 2 TABLETS BY MOUTH AT BEDTIME .  silver sulfADIAZINE (SILVADENE) 1 % cream, APPLY TOPICALLY TO AFFECTED AREA EVERY DAY .  sulfamethoxazole-trimethoprim (BACTRIM DS,SEPTRA DS) 800-160 MG tablet, Take 1 tablet by mouth 2 (two) times daily. .  traZODone (DESYREL) 100 MG tablet, TAKE 1/2 TABLET BY MOUTH EVERY DAY AT BEDTIME AS NEEDED .  traZODone (DESYREL) 50 MG tablet, TAKE 1 TABLET (50 MG TOTAL) BY MOUTH AT BEDTIME AS NEEDED FOR SLEEP. [name]  triamcinolone (KENALOG) 0.1 % paste, Use as directed 1 application in the mouth or throat 2 (two) times daily. .  TUMS 500 MG chewable tablet, Chew 500-2,000 mg by mouth every 4 (four) hours as needed for indigestion or heartburn.  .  valACYclovir (VALTREX) 1000 MG tablet, TAKE 1 TABLET BY MOUTH THREE TIMES A DAY .  venlafaxine XR (EFFEXOR-XR) 37.5 MG 24 hr capsule, TAKE ONE CAPSULE BY MOUTH EVERY DAY WITH BREAKFAST .  zolpidem (AMBIEN) 10 MG tablet, TAKE 1 TABLET BY MOUTH EVERYDAY AT BEDTIME    Past medical history, social, surgical and family history all reviewed in electronic medical record.  No pertanent information unless stated regarding to the chief complaint.   Review of Systems:  No headache, visual changes, nausea, vomiting, diarrhea, constipation, dizziness, abdominal pain, skin rash, fevers, chills, night sweats, weight loss, swollen lymph nodes, body aches, joint swelling,  chest pain, shortness of breath, mood changes.  Positive muscle aches   Objective  Blood pressure 118/80, pulse 71, height 5\' 10"  (1.778 m), weight 227 lb (103 kg), SpO2 96 %.    General: No apparent distress alert and oriented x3 mood and affect normal, dressed appropriately.  HEENT: Pupils equal, extraocular movements intact  Respiratory: Patient's speak in full sentences and does not appear short of breath  Cardiovascular: No lower extremity edema, non tender, no erythema  Skin: Warm dry intact with no signs of infection or rash on extremities or on axial skeleton.  Abdomen: Soft nontender  Neuro: Cranial nerves II through XII are intact, neurovascularly intact in all extremities with 2+ DTRs and 2+ pulses.  Lymph: No lymphadenopathy of posterior or anterior cervical chain or axillae bilaterally.  Gait normal with good balance and coordination.  MSK:  tender with mild limited range of motion and stability and symmetric strength and tone of , elbows, wrist and ankles bilaterally.    Left shoulder shows the patient does have significant atrophy noted.  Patient 4 out of 5 strength of the rotator cuff.  Mild atrophy of the shoulder girdle.  Knee: Left valgus deformity noted.  Abnormal thigh to calf ratio.  Tender to palpation over medial and PF joint line.  ROM full in flexion and extension and lower leg rotation. instability with valgus force.  painful patellar compression. Patellar glide with moderate crepitus. Patellar and quadriceps tendons unremarkable. Hamstring and quadriceps strength is  normal. Contralateral knee shows arthritic changes noted.  Procedure: Real-time Ultrasound Guided Injection of left glenohumeral joint Device: GE Logiq E  Ultrasound guided injection is preferred based studies that show increased duration, increased effect, greater accuracy, decreased procedural pain, increased response rate with ultrasound guided versus blind injection.  Verbal informed consent obtained.  Time-out conducted.  Noted no overlying erythema,  induration, or other signs of local infection.  Skin prepped in a sterile fashion.  Local anesthesia: Topical Ethyl chloride.  With sterile technique and under real time ultrasound guidance:  Joint visualized.  21g 2 inch needle inserted posterior approach. Pictures taken for needle placement. Patient did have injection of 2 cc of 0.5% Marcaine, and 1cc of Kenalog 40 mg/dL. Completed without difficulty  Pain immediately resolved suggesting accurate placement of the medication.  Advised to call if fevers/chills, erythema, induration, drainage, or persistent bleeding.  Images permanently stored and available for review in the ultrasound unit.  Impression: Technically successful ultrasound guided injection.  After informed written and verbal consent, patient was seated on exam table. Left knee was prepped with alcohol swab and utilizing anterolateral approach, patient's left knee space was injected with 4:1  marcaine 0.5%: Kenalog 40mg /dL. Patient tolerated the procedure well without immediate complications.    Impression and Recommendations:     This case required medical decision making of moderate complexity. The above documentation has been reviewed and is accurate and complete Lyndal Pulley, DO       Note: This dictation was prepared with Dragon dictation along with smaller phrase technology. Any transcriptional errors that result from this process are unintentional.

## 2019-09-23 NOTE — Patient Instructions (Signed)
  [address]st floor [place], [place] [address] Phone [phone]  [email or web address] for metatarsal cookies See me again in 9 weeks

## 2019-09-23 NOTE — Assessment & Plan Note (Signed)
For f/u psa,  Asympt, to f/u any worsening symptoms or concerns

## 2019-09-23 NOTE — Progress Notes (Signed)
Subjective:    Patient ID: [name], male    DOB: April 16, 1956, 63 y.o.   MRN: [id number]  HPI  Here to f/u; overall doing ok,  Pt denies chest pain, increasing sob or doe, wheezing, orthopnea, PND, increased LE swelling, palpitations, dizziness or syncope.  Pt denies new neurological symptoms such as new headache, or facial or extremity weakness or numbness.  Pt denies polydipsia, polyuria, or low sugar episode.  Pt states overall good compliance with meds, mostly trying to follow appropriate diet, with wt overall stable,  but little exercise however.  Denies urinary symptoms such as dysuria, frequency, urgency, flank pain, hematuria or n/v, fever, chills Wt Readings from Last 3 Encounters:  09/23/19 227 lb (103 kg)  09/23/19 227 lb (103 kg)  08/06/19 227 lb (103 kg)   Past Medical History:  Diagnosis Date  . AICD (automatic cardioverter/defibrillator) present   . Anemia    supposed to be taking Vit B but doesn't  . ANXIETY    takes Xanax nightly  . Arthritis   . Asthma    Albuterol prn and Advair daily;also takes Prednisone daily  . Atrial fibrillation ([place]) 09/06/2015  . Cardiomyopathy [facility])    a. EF 25% TEE July 2013; b. EF normalized 2015;  c. 03/2015 Echo: EF 40-45%, difrf HK, PASP 38 mmHg, Mild MR, sev LAE/RAE.  [name] Chronic constipation    takes OTC stool softener  . COPD (chronic obstructive pulmonary disease) ([place])    "one dr says COPD; one dr says emphysema" (09/18/2017)  . DEPRESSION    takes Zoloft and Doxepin daily  . Diverticulitis   . DYSKINESIA, ESOPHAGUS   . Emphysema of lung [facility])    "one dr says COPD; one dr says emphysema" (09/18/2017)  . Essential hypertension       . FIBROMYALGIA   . GERD (gastroesophageal reflux disease)       . Glaucoma   . HYPERLIPIDEMIA    a. Intolerant to statins.  . INSOMNIA    takes Ambien nightly  . Myocardial infarction [facility])    a. 2012 Myoview notable for prior infarct;  b. 03/2015 Lexiscan CL: EF 37%, diff HK, small area of  inferior infarct from apex to base-->Med Rx.  . Myocardial infarction ([place])   . O2 dependent    "2.5L q hs & prn" (09/18/2017)  . Paroxysmal atrial fibrillation ([facility])    a. CHA2DS2VASc = 3--> takes Coumadin;  b. 03/15/2015 Successful TEE/DCCV;  c. 03/2015 recurrent afib, Amio d/c'd in setting of hyperthyroidism.  . Peripheral neuropathy   . Pneumonia 12/2016  . Rash and other nonspecific skin eruption 04/12/2009   no cause found saw dermatologists x 2 and allergist  . SLEEP APNEA, OBSTRUCTIVE    a. doesn't use CPAP  . Syncope    a. 03/2015 s/p MDT LINQ.  [name] Type II diabetes mellitus ([place])        Past Surgical History:  Procedure Laterality Date  . ACNE CYST REMOVAL     2 on back   . AV NODE ABLATION N/A 10/25/2017   Procedure: AV NODE ABLATION;  Surgeon: Deboraha Sprang, MD;  Location: [place] CV LAB;  Service: Cardiovascular;  Laterality: N/A;  . BIV ICD INSERTION CRT-D N/A 09/18/2017   Procedure: BIV ICD INSERTION CRT-D;  Surgeon: Deboraha Sprang, MD;  Location: [place] CV LAB;  Service: Cardiovascular;  Laterality: N/A;  . CARDIAC CATHETERIZATION N/A 03/21/2016   Procedure: Right/Left Heart Cath and Coronary Angiography;  Surgeon: Larey Dresser, MD;  Location: [place] CV LAB;  Service: Cardiovascular;  Laterality: N/A;  . CARDIOVERSION  04/18/2012   Procedure: CARDIOVERSION;  Surgeon: Fay Records, MD;  Location: [place];  Service: Cardiovascular;  Laterality: N/A;  . CARDIOVERSION  04/25/2012   Procedure: CARDIOVERSION;  Surgeon: Thayer Headings, MD;  Location: [place];  Service: Cardiovascular;  Laterality: N/A;  . CARDIOVERSION  04/25/2012   Procedure: CARDIOVERSION;  Surgeon: Fay Records, MD;  Location: [place];  Service: Cardiovascular;  Laterality: N/A;  . CARDIOVERSION  05/09/2012   Procedure: CARDIOVERSION;  Surgeon: Sherren Mocha, MD;  Location: [place];  Service: Cardiovascular;  Laterality: N/A;  changed from crenshaw to cooper by trish/leone-endo  . CARDIOVERSION N/A  03/15/2015   Procedure: CARDIOVERSION;  Surgeon: Thayer Headings, MD;  Location: [facility] ENDOSCOPY;  Service: Cardiovascular;  Laterality: N/A;  . COLONOSCOPY    . COLONOSCOPY WITH PROPOFOL N/A 10/21/2014   Procedure: COLONOSCOPY WITH PROPOFOL;  Surgeon: Ladene Artist, MD;  Location: [facility] ENDOSCOPY;  Service: Endoscopy;  Laterality: N/A;  . EP IMPLANTABLE DEVICE N/A 04/06/2015   Procedure: Loop Recorder Insertion;  Surgeon: Evans Lance, MD;  Location: [place] CV LAB;  Service: Cardiovascular;  Laterality: N/A;  . ESOPHAGOGASTRODUODENOSCOPY    . JOINT REPLACEMENT    . LOOP RECORDER REMOVAL N/A 09/18/2017   Procedure: LOOP RECORDER REMOVAL;  Surgeon: Deboraha Sprang, MD;  Location: [place] CV LAB;  Service: Cardiovascular;  Laterality: N/A;  . RIGHT/LEFT HEART CATH AND CORONARY ANGIOGRAPHY N/A 01/28/2017   Procedure: Right/Left Heart Cath and Coronary Angiography;  Surgeon: Larey Dresser, MD;  Location: [place] CV LAB;  Service: Cardiovascular;  Laterality: N/A;  . TEE WITHOUT CARDIOVERSION  04/25/2012   Procedure: TRANSESOPHAGEAL ECHOCARDIOGRAM (TEE);  Surgeon: Thayer Headings, MD;  Location: [place];  Service: Cardiovascular;  Laterality: N/A;  . TEE WITHOUT CARDIOVERSION N/A 03/15/2015   Procedure: TRANSESOPHAGEAL ECHOCARDIOGRAM (TEE);  Surgeon: Thayer Headings, MD;  Location: [place];  Service: Cardiovascular;  Laterality: N/A;  . TONSILLECTOMY AND ADENOIDECTOMY    . TOTAL KNEE ARTHROPLASTY Right 06/15/2014   Procedure: TOTAL KNEE ARTHROPLASTY;  Surgeon: Renette Butters, MD;  Location: [place];  Service: Orthopedics;  Laterality: Right;    reports that he quit smoking about 11 years ago. His smoking use included cigarettes. He has a 60.00 pack-year smoking history. He has never used smokeless tobacco. He reports that he does not drink alcohol or use drugs. family history includes Allergies in his mother; Asthma in his maternal grandmother and mother; COPD in his mother; Colon  polyps in his mother; Hypothyroidism in his mother. Allergies  Allergen Reactions  . Amiodarone Other (See Comments)    hyperthyroidism  . Statins Other (See Comments)    myalgia  . Tape Other (See Comments)    Skin Tears Use Paper Tape Only   Current Outpatient Medications on File Prior to Visit  Medication Sig Dispense Refill  . acetaminophen (TYLENOL) 500 MG tablet Take 500 mg by mouth every 6 (six) hours as needed.    [name] albuterol (VENTOLIN HFA) 108 (90 Base) MCG/ACT inhaler INHALE 2 PUFFS 4 TIMES A DAY as needed 54 Inhaler 3  . ALPRAZolam (XANAX) 0.5 MG tablet TAKE 2 TABLETS BY MOUTH AT BEDTIME 60 tablet 2  . apixaban (ELIQUIS) 5 MG TABS tablet Take 1 tablet (5 mg total) by mouth 2 (two) times daily. 180 tablet 3  . carisoprodol (SOMA) 350 MG tablet  TAKE 1 TABLET BY MOUTH THREE TIMES A DAY AS NEEDED FOR MUSCLE SPASMS 90 tablet 5  . carvedilol (COREG) 12.5 MG tablet TAKE 1 TABLET (12.5 MG TOTAL) BY MOUTH 2 (TWO) TIMES DAILY WITH A MEAL. 180 tablet 3  . cephALEXin (KEFLEX) 500 MG capsule Take 1 capsule (500 mg total) by mouth 2 (two) times daily. 14 capsule 0  . clobetasol ointment (TEMOVATE) [id number] % Apply 1 application topically 2 (two) times daily. 60 g 1  . digoxin (LANOXIN) 0.25 MG tablet TAKE 1/2 TABLET BY MOUTH EVERY DAY 45 tablet 0  . doxepin (SINEQUAN) 10 MG capsule TAKE 2 CAPSULES BY MOUTH AT BEDTIME. ANNUAL APPT DUE IN JUNE MUST SEE PROVIDER FOR FUTURE REFILLS 180 capsule 1  . ezetimibe (ZETIA) 10 MG tablet Take 1 tablet (10 mg total) by mouth daily. 90 tablet 3  . fluticasone (FLONASE) 50 MCG/ACT nasal spray Place 2 sprays into both nostrils daily as needed for allergies or rhinitis. 16 g 5  . Fluticasone-Salmeterol (ADVAIR DISKUS) 250-50 MCG/DOSE AEPB Inhale 1 puff into the lungs 2 (two) times daily. 180 each 3  . hydrocortisone 2.5 % cream APPLY TO AFFECTED AREA TWICE A DAY 28.35 g 6  . hydrOXYzine (ATARAX/VISTARIL) 10 MG tablet TAKE 1 TABLET BY MOUTH THREE TIMES A DAY AS  NEEDED 60 tablet 2  . ketoconazole (NIZORAL) 2 % cream APPLY TO AFFECTED AREA EVERY DAY 15 g 0  . lidocaine (LIDODERM) 5 % APPLY 1 PATCH TO AFFECTED AREA FOR UP TO 12 HOURS . DO NOT USE MORE THAN ONE PATCH IN 24 HOURS. 90 patch 1  . methotrexate (RHEUMATREX) 2.5 MG tablet TAKE 4 TABLETS BY MOUTH EVERY WEEK 48 tablet 1  . omeprazole (PRILOSEC) 20 MG capsule Take 1 capsule (20 mg total) by mouth daily with supper. 90 capsule 1  . omeprazole (PRILOSEC) 20 MG capsule TAKE 1 CAPSULE BY MOUTH TWICE A DAY 180 capsule 1  . predniSONE (DELTASONE) 10 MG tablet 3 tabs by mouth per day for 3 days,2tabs per day for 3 days,1tab per day for 3 days 18 tablet 0  . sertraline (ZOLOFT) 100 MG tablet TAKE 2 TABLETS BY MOUTH AT BEDTIME 180 tablet 3  . silver sulfADIAZINE (SILVADENE) 1 % cream APPLY TOPICALLY TO AFFECTED AREA EVERY DAY 50 g 3  . spironolactone (ALDACTONE) 25 MG tablet TAKE 1 TABLET BY MOUTH EVERY DAY 90 tablet 1  . sulfamethoxazole-trimethoprim (BACTRIM DS,SEPTRA DS) 800-160 MG tablet Take 1 tablet by mouth 2 (two) times daily. 14 tablet 0  . torsemide (DEMADEX) 20 MG tablet TAKE 2 TABLETS BY MOUTH TWICE A DAY 360 tablet 1  . traMADol (ULTRAM) 50 MG tablet TAKE 1 TABLET (50 MG TOTAL) BY MOUTH EVERY 6 (SIX) HOURS AS NEEDED. 30 tablet 2  . traZODone (DESYREL) 100 MG tablet TAKE 1/2 TABLET BY MOUTH EVERY DAY AT BEDTIME AS NEEDED 45 tablet 1  . traZODone (DESYREL) 50 MG tablet TAKE 1 TABLET (50 MG TOTAL) BY MOUTH AT BEDTIME AS NEEDED FOR SLEEP. 90 tablet 1  . triamcinolone (KENALOG) 0.1 % paste Use as directed 1 application in the mouth or throat 2 (two) times daily. 5 g 2  . TUMS 500 MG chewable tablet Chew 500-2,000 mg by mouth every 4 (four) hours as needed for indigestion or heartburn.     . valACYclovir (VALTREX) 1000 MG tablet TAKE 1 TABLET BY MOUTH THREE TIMES A DAY 21 tablet 2  . venlafaxine XR (EFFEXOR-XR) 37.5 MG 24 hr capsule TAKE  ONE CAPSULE BY MOUTH EVERY DAY WITH BREAKFAST 90 capsule 1  .  warfarin (COUMADIN) 5 MG tablet Take 1 tablet daily except 1/2 tablet on Monday and Friday or Take as directed by anticoagulation clinic. 90 tablet 1  . zolpidem (AMBIEN) 10 MG tablet TAKE 1 TABLET BY MOUTH EVERYDAY AT BEDTIME 90 tablet 1   No current facility-administered medications on file prior to visit.    Review of Systems  Constitutional: Negative for other unusual diaphoresis or sweats HENT: Negative for ear discharge or swelling Eyes: Negative for other worsening visual disturbances Respiratory: Negative for stridor or other swelling  Gastrointestinal: Negative for worsening distension or other blood Genitourinary: Negative for retention or other urinary change Musculoskeletal: Negative for other MSK pain or swelling Skin: Negative for color change or other new lesions Neurological: Negative for worsening tremors and other numbness  Psychiatric/Behavioral: Negative for worsening agitation or other fatigue All otherwise neg per pt     Objective:   Physical Exam BP 118/80   Pulse 71   Ht 5\' 10"  (1.778 m)   Wt 227 lb (103 kg)   SpO2 96%   BMI 32.57 kg/m  VS noted,  Constitutional: Pt appears in NAD HENT: Head: NCAT.  Right Ear: External ear normal.  Left Ear: External ear normal.  Eyes: . Pupils are equal, round, and reactive to light. Conjunctivae and EOM are normal Nose: without d/c or deformity Neck: Neck supple. Gross normal ROM Cardiovascular: Normal rate and regular rhythm.   Pulmonary/Chest: Effort normal and breath sounds without rales or wheezing.  Abd:  Soft, NT, ND, + BS, no organomegaly Neurological: Pt is alert. At baseline orientation, motor grossly intact Skin: Skin is warm. No rashes, other new lesions, no LE edema Psychiatric: Pt behavior is normal without agitation  All otherwise neg per pt  Lab Results  Component Value Date   WBC 9.4 03/23/2019   HGB 14.4 03/23/2019   HCT 42.9 03/23/2019   PLT 195.0 03/23/2019   GLUCOSE 96 09/23/2019   CHOL  171 09/23/2019   TRIG 106.0 09/23/2019   HDL 55.50 09/23/2019   LDLDIRECT 121 (H) 12/19/2017   LDLCALC 94 09/23/2019   ALT 24 09/23/2019   AST 19 09/23/2019   NA 141 09/23/2019   K 3.9 09/23/2019   CL 106 09/23/2019   CREATININE 0.87 09/23/2019   BUN 26 (H) 09/23/2019   CO2 29 09/23/2019   TSH 2.48 03/23/2019   PSA 2.89 09/23/2019   INR 1.0 (A) 12/23/2018   HGBA1C 5.2 09/23/2019   MICROALBUR <0.7 03/23/2019       Assessment & Plan:

## 2019-09-23 NOTE — Assessment & Plan Note (Signed)
Repeat injection given today, discussed icing regimen and home exercise, discussed avoiding certain activities.  Patient to increase activity slowly over the course the next several weeks.  Follow-up again 8 to 10 weeks

## 2019-09-24 ENCOUNTER — Ambulatory Visit (HOSPITAL_COMMUNITY)
Admission: RE | Admit: 2019-09-24 | Discharge: 2019-09-24 | Disposition: A | Discharge status: Home / Self Care | Payer: Medicare HMO | Source: Ambulatory Visit | Attending: Cardiology | Admitting: Cardiology

## 2019-09-24 DIAGNOSIS — I428 Other cardiomyopathies: Secondary | ICD-10-CM | POA: Diagnosis not present

## 2019-09-24 DIAGNOSIS — J449 Chronic obstructive pulmonary disease, unspecified: Secondary | ICD-10-CM | POA: Diagnosis not present

## 2019-09-24 DIAGNOSIS — I5022 Chronic systolic (congestive) heart failure: Secondary | ICD-10-CM | POA: Diagnosis not present

## 2019-09-24 DIAGNOSIS — I4821 Permanent atrial fibrillation: Secondary | ICD-10-CM | POA: Diagnosis not present

## 2019-09-24 DIAGNOSIS — I442 Atrioventricular block, complete: Secondary | ICD-10-CM | POA: Diagnosis not present

## 2019-09-24 MED ORDER — ROSUVASTATIN CALCIUM 5 MG PO TABS: 5.0000 mg | ORAL_TABLET | Freq: Every day | ORAL | 3 refills | Status: DC

## 2019-09-24 NOTE — Progress Notes (Signed)
AVS mailed to patient, scheduled for next appts, rx sent to pharmacy. Pt aware

## 2019-09-24 NOTE — Patient Instructions (Addendum)
START Crestor 5mg  (1 tab) daily  Labs: Lipids and Liver Function in 3 months We will only contact you if something comes back abnormal or we need to make some changes. Otherwise no news is good news!'  Your physician has requested that you have an echocardiogram. Echocardiography is a painless test that uses sound waves to create images of your heart. It provides your doctor with information about the size and shape of your heart and how well your heart's chambers and valves are working. This procedure takes approximately one hour. There are no restrictions for this procedure.  Your physician recommends that you schedule a follow-up appointment in: 6 weeks with Dr [name]  At the [facility], you and your health needs are our priority. As part of our continuing mission to provide you with exceptional heart care, we have created designated Provider Care Teams. These Care Teams include your primary Cardiologist (physician) and Advanced Practice Providers (APPs- Physician Assistants and Nurse Practitioners) who all work together to provide you with the care you need, when you need it.   You may see any of the following providers on your designated Care Team at your next follow up: [name] Dr Glori Bickers . Dr Loralie Champagne . Darrick Grinder, NP . Lyda Jester, PA . Audry Riles, PharmD   Please be sure to bring in all your medications bottles to every appointment.

## 2019-09-24 NOTE — Progress Notes (Signed)
Heart Failure TeleHealth Note  Due to national recommendations of social distancing due to [place] 19, Audio/video telehealth visit is felt to be most appropriate for this patient at this time.  See MyChart message from today for patient consent regarding telehealth for [name].  Date:  09/24/2019   ID:  [name], DOB Nov 24, 1955, MRN [id number]  Location: Home  Provider location: [facility] Type of Visit: Established patient  PCP:  [name], MD  Cardiologist:  Dr. [name]  Chief Complaint: Fatigue   History of Present Illness: [name] is a 63 y.o. male who presents via audio/video conferencing for a telehealth visit today.     he denies symptoms worrisome for COVID 19.   Patient has history of COPD on home oxygen, permanent atrial fibrillation, and chronic systolic CHF.  Amiodarone and DCCV were tried for atrial fibrillation in the past without success.   Patient has been noted to have a low EF since 2013.  EF improved by to normal by 2015 but dropped to 30-35% by 11/16.  Cath in 6/17 showed some CAD but not enough to cause cardiomyopathy.  He was admitted in 3/18 with PNA, atrial fibrillation/RVR, and acute on chronic systolic CHF.  Echo in 3/18 showed fall in EF to 10-15%.   I took him for right and left heart cath in 4/18, which showed nonobstructive CAD and relatively optimized filling pressures.    Syncope in 11/18. Loop recorder showed 6 second pause and periods of complete heart block. He had Medtronic BiV ICD placed in 12/18.  In 1/19, he had AV nodal ablation to promote BiV pacing.   Echo was done 6/19 with EF 25-30% with moderate RV dilation/mildly decreased systolic function. CPX 7/19 was submaximal, probably mild functional impairment.   I have not seen him in about a year.  He says that he has felt much better since CRT implantation.  No significant dyspnea, he is thinking about going back to work.  No chest pain.  No  orthopnea/PND.  No lightheadedness. He never started on Crestor.    Labs (4/18): pro-BNP 904, K 3.1, creatinine 1.39 => 1.08 with BUN 66 => 37, hgb 14.1.  Labs (5/18): hemoglobin 13.1 Labs (6/18): K 4, creatinine 1.3, BNP 294 Labs (7/18): K 3.8, creatinine 1.23, LDL 96, HDL 42 Labs (8/18): K 4.5, creatinine 1.22 Labs (10/18): digoxin 0.4 Labs (12/18): LDL 115, HDL 37, TGs 214, K 4.2, creatinine 0.84 Labs (1/19): K 4.4, creatinine 0.75, pro-BNP 433 Labs (2/19): digoxin 0.4 Labs (3/19): LDL 121, hgb 12.8, K 4.5, creatinine 0.98 Labs (6/19): LDL 48, HDL 45, K 5, creatinine 0.94, digoxin 0.3 Labs (9/19): K 3.7, creatinine 1.09 => 1.32, digoxin 0.2 Labs (11/19): LDL 115 Labs (12/20): K 3.9, creatinine 0.87, LDL 94  PMH: 1. Asthma 2. COPD: On home oxygen. Prior smoker.  3. Atrial fibrillation: Permanent.  He failed DCCV and amiodarone in the past . 4. HTN 5. GERD 6. Hyperlipidemia: Myalgias with atorvastatin 7. BPH.  8. Chronic systolic CHF: Nonischemic cardiomyopathy.  - EF 25% by TEE in 7/13.  Normalized on 2015 echo. - Echo (11/16): EF 30-35%.  - LHC/RHC (6/17): 80% ostial stenosis small D1, 30-40% pLAD.  Mean RA 4, PA 26/10, mean PCWP 11, CI 2.33.  - Echo (3/18): EF 10-15%, diffuse hypokinesis, severe LV dilation, moderate RV dilation with severely decreased systolic function.  - LHC/RHC (4/18): 40% proximal LAD.  Mean RA 8, PA 37/10 mean 22, mean PCWP  22, LVEDP 9, CI 2.39. - Medtronic BiV ICD placement in 12/18 with AV nodal ablation to promote BiV pacing in 1/19.  - Echo (6/19): EF 25-30% with mild LV dilation, moderately dilated RV with mildly decreased systolic function, IVC not dilated, PASP 23 mmHg.  - CPX (7/19): peak VO2 17.6 (78% predicted), VE/VCO2 slope 31, RER 1.02 => submaximal, probably mild functional impairment.  9. Morbid obesity 10. ILR in place.  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 Complete heart block: s/p Medtronic CRT-D.   Social History    Socioeconomic History  . Marital status: Divorced    Spouse name: Not on file  . Number of children: 2  . Years of education: Not on file  . Highest education level: Not on file  Occupational History  . Occupation: retired/disabled. prev worked in Therapist, sports.    Employer: DISABLED  Tobacco Use  . Smoking status: Former Smoker    Packs/day: 2.00    Years: 30.00    Pack years: 60.00    Types: Cigarettes    Quit date: 10/16/2007    Years since quitting: 11.9  . Smokeless tobacco: Never Used  Substance and Sexual Activity  . Alcohol use: No  . Drug use: No  . Sexual activity: Not Currently  Other Topics Concern  . Not on file  Social History Narrative   Lives alone.   Social Determinants of Health   Financial Resource Strain:   . Difficulty of Paying Living Expenses: Not on file  Food Insecurity:   . Worried About Charity fundraiser in the Last Year: Not on file  . Ran Out of Food in the Last Year: Not on file  Transportation Needs:   . Lack of Transportation (Medical): Not on file  . Lack of Transportation (Non-Medical): Not on file  Physical Activity:   . Days of Exercise per Week: Not on file  . Minutes of Exercise per Session: Not on file  Stress:   . Feeling of Stress : Not on file  Social Connections:   . Frequency of Communication with Friends and Family: Not on file  . Frequency of Social Gatherings with Friends and Family: Not on file  . Attends Religious Services: Not on file  . Active Member of Clubs or Organizations: Not on file  . Attends Archivist Meetings: Not on file  . Marital Status: Not on file  Intimate Partner Violence:   . Fear of Current or Ex-Partner: Not on file  . Emotionally Abused: Not on file  . Physically Abused: Not on file  . Sexually Abused: Not on file   Family History  Problem Relation Age of Onset  . COPD Mother   . Asthma Mother   . Colon polyps Mother   . Allergies Mother   . Hypothyroidism Mother   .  Asthma Maternal Grandmother   . Colon cancer Neg Hx    ROS: All systems reviewed and negative except as per HPI.   Current Outpatient Medications  Medication Sig Dispense Refill  . acetaminophen (TYLENOL) 500 MG tablet Take 500 mg by mouth every 6 (six) hours as needed.    [name] albuterol (VENTOLIN HFA) 108 (90 Base) MCG/ACT inhaler INHALE 2 PUFFS 4 TIMES A DAY as needed 54 Inhaler 3  . ALPRAZolam (XANAX) 0.5 MG tablet TAKE 2 TABLETS BY MOUTH AT BEDTIME 60 tablet 2  . apixaban (ELIQUIS) 5 MG TABS tablet Take 1 tablet (5 mg total) by mouth 2 (two) times daily. 180 tablet  3  . carisoprodol (SOMA) 350 MG tablet TAKE 1 TABLET BY MOUTH THREE TIMES A DAY AS NEEDED FOR MUSCLE SPASMS 90 tablet 5  . carvedilol (COREG) 12.5 MG tablet TAKE 1 TABLET (12.5 MG TOTAL) BY MOUTH 2 (TWO) TIMES DAILY WITH A MEAL. 180 tablet 3  . cephALEXin (KEFLEX) 500 MG capsule Take 1 capsule (500 mg total) by mouth 2 (two) times daily. 14 capsule 0  . clobetasol ointment (TEMOVATE) [id number] % Apply 1 application topically 2 (two) times daily. 60 g 1  . digoxin (LANOXIN) 0.25 MG tablet TAKE 1/2 TABLET BY MOUTH EVERY DAY 45 tablet 0  . doxepin (SINEQUAN) 10 MG capsule TAKE 2 CAPSULES BY MOUTH AT BEDTIME. ANNUAL APPT DUE IN JUNE MUST SEE PROVIDER FOR FUTURE REFILLS 180 capsule 1  . ezetimibe (ZETIA) 10 MG tablet Take 1 tablet (10 mg total) by mouth daily. 90 tablet 3  . fluticasone (FLONASE) 50 MCG/ACT nasal spray Place 2 sprays into both nostrils daily as needed for allergies or rhinitis. 16 g 5  . Fluticasone-Salmeterol (ADVAIR DISKUS) 250-50 MCG/DOSE AEPB Inhale 1 puff into the lungs 2 (two) times daily. 180 each 3  . hydrocortisone 2.5 % cream APPLY TO AFFECTED AREA TWICE A DAY 28.35 g 6  . hydrOXYzine (ATARAX/VISTARIL) 10 MG tablet TAKE 1 TABLET BY MOUTH THREE TIMES A DAY AS NEEDED 60 tablet 2  . ketoconazole (NIZORAL) 2 % cream APPLY TO AFFECTED AREA EVERY DAY 15 g 0  . lidocaine (LIDODERM) 5 % APPLY 1 PATCH TO AFFECTED AREA FOR  UP TO 12 HOURS . DO NOT USE MORE THAN ONE PATCH IN 24 HOURS. 90 patch 1  . methotrexate (RHEUMATREX) 2.5 MG tablet TAKE 4 TABLETS BY MOUTH EVERY WEEK 48 tablet 1  . omeprazole (PRILOSEC) 20 MG capsule Take 1 capsule (20 mg total) by mouth daily with supper. 90 capsule 1  . omeprazole (PRILOSEC) 20 MG capsule TAKE 1 CAPSULE BY MOUTH TWICE A DAY 180 capsule 1  . predniSONE (DELTASONE) 10 MG tablet 3 tabs by mouth per day for 3 days,2tabs per day for 3 days,1tab per day for 3 days 18 tablet 0  . rosuvastatin (CRESTOR) 5 MG tablet Take 1 tablet (5 mg total) by mouth daily. 90 tablet 3  . sertraline (ZOLOFT) 100 MG tablet TAKE 2 TABLETS BY MOUTH AT BEDTIME 180 tablet 3  . silver sulfADIAZINE (SILVADENE) 1 % cream APPLY TOPICALLY TO AFFECTED AREA EVERY DAY 50 g 3  . spironolactone (ALDACTONE) 25 MG tablet TAKE 1 TABLET BY MOUTH EVERY DAY 90 tablet 1  . sulfamethoxazole-trimethoprim (BACTRIM DS,SEPTRA DS) 800-160 MG tablet Take 1 tablet by mouth 2 (two) times daily. 14 tablet 0  . torsemide (DEMADEX) 20 MG tablet TAKE 2 TABLETS BY MOUTH TWICE A DAY 360 tablet 1  . traMADol (ULTRAM) 50 MG tablet TAKE 1 TABLET (50 MG TOTAL) BY MOUTH EVERY 6 (SIX) HOURS AS NEEDED. 30 tablet 2  . traZODone (DESYREL) 100 MG tablet TAKE 1/2 TABLET BY MOUTH EVERY DAY AT BEDTIME AS NEEDED 45 tablet 1  . traZODone (DESYREL) 50 MG tablet TAKE 1 TABLET (50 MG TOTAL) BY MOUTH AT BEDTIME AS NEEDED FOR SLEEP. 90 tablet 1  . triamcinolone (KENALOG) 0.1 % paste Use as directed 1 application in the mouth or throat 2 (two) times daily. 5 g 2  . TUMS 500 MG chewable tablet Chew 500-2,000 mg by mouth every 4 (four) hours as needed for indigestion or heartburn.     . valACYclovir (  VALTREX) 1000 MG tablet TAKE 1 TABLET BY MOUTH THREE TIMES A DAY 21 tablet 2  . venlafaxine XR (EFFEXOR-XR) 37.5 MG 24 hr capsule TAKE ONE CAPSULE BY MOUTH EVERY DAY WITH BREAKFAST 90 capsule 1  . warfarin (COUMADIN) 5 MG tablet Take 1 tablet daily except 1/2 tablet  on Monday and Friday or Take as directed by anticoagulation clinic. 90 tablet 1  . zolpidem (AMBIEN) 10 MG tablet TAKE 1 TABLET BY MOUTH EVERYDAY AT BEDTIME 90 tablet 1   No current facility-administered medications for this encounter.   (Video/Tele Health Call; Exam is subjective and or/visual.) General:  Speaks in full sentences. No resp difficulty. Lungs: Normal respiratory effort with conversation.  Abdomen: Non-distended per patient report Extremities: Pt denies edema. Neuro: Alert & oriented x 3.   Assessment/Plan:  1. Atrial fibrillation: Permanent.  Now s/p AV nodal ablation to allow effective BiV pacing.  - We have discussed switching from warfarin to DOAC, but it appears that DOACs would end up being too expensive for him.  2. COPD: No longer smoking.  He is on home oxygen at night.   3. Chronic systolic CHF: Nonischemic cardiomyopathy based on 6/17 cath. However, he had a recent significant fall in EF from 30-35% to 10-15% on echo from 3/18.  LHC/RHC in 4/18 showed nonobstructive CAD and relatively optimized filling pressures.  He now has Medtronic CRT-D device with AV nodal ablation to allow BiV pacing.  Echo 6/19 with EF 25-30% with mildly decreased RV systolic function. CPX 7/19 was submaximal but likely only mild functional limitation from CHF.  Improved NYHA class II symptoms.   - Continue Coreg 12.5 mg bid.   - Continue spironolactone 25 mg daily.        - He is off Entresto due to "dizziness."  - Continue torsemide 40 qam/20 qpm.  - Continue digoxin 0.125 daily, check level.  - I will obtain a repeat echo.  If EF remains low, he will start losartan 25 mg daily.  4. CAD: Nonobstructive on 4/18 cath.  No exertional chest pain.  - He is on warfarin so no ASA.  - Stopped atorvastatin and Zetia due to myalgias.  I will start him on Crestor 5 mg daily.  If he has myalgias with this, will need Repatha.  Check LFTs/lipids in 2 months.   5. Complete heart block: Now has Medtronic  CRT-D device and s/p AV nodal ablation.  6. Rash: Spongiotic dermatitis.  Now on methotrexate, much improved.   COVID screen The patient does not have any symptoms that suggest any further testing/ screening at this time.  Social distancing reinforced today.  Patient Risk: After full review of this patients clinical status, I feel that they are at moderate risk for cardiac decompensation at this time.  Relevant cardiac medications were reviewed at length with the patient today. The patient does not have concerns regarding their medications at this time.   Recommended follow-up:  3 months  Today, I have spent 16 minutes with the patient with telehealth technology discussing the above issues .    Signed, Loralie Champagne, MD  09/24/2019  [place] [address] [facility] and [place] [address] [phone] (office) [phone] (fax)

## 2019-09-28 ENCOUNTER — Ambulatory Visit (INDEPENDENT_AMBULATORY_CARE_PROVIDER_SITE_OTHER): Payer: Medicare HMO | Admitting: *Deleted

## 2019-09-28 DIAGNOSIS — I428 Other cardiomyopathies: Secondary | ICD-10-CM

## 2019-09-28 LAB — CUP PACEART REMOTE DEVICE CHECK
Battery Remaining Longevity: 66 mo
Battery Voltage: 2.99 V
Brady Statistic AP VP Percent: 0 %
Brady Statistic AP VS Percent: 0 %
Brady Statistic AS VP Percent: 0 %
Brady Statistic AS VS Percent: 0 %
Brady Statistic RA Percent Paced: 0 %
Brady Statistic RV Percent Paced: 99.96 %
Date Time Interrogation Session: 20201214033523
HighPow Impedance: 74 Ohm
Implantable Lead Implant Date: 20181205
Implantable Lead Implant Date: 20181205
Implantable Lead Location: 753858
Implantable Lead Location: 753860
Implantable Lead Model: 4398
Implantable Pulse Generator Implant Date: 20181205
Lead Channel Impedance Value: 1007 Ohm
Lead Channel Impedance Value: 218.087
Lead Channel Impedance Value: 253.786
Lead Channel Impedance Value: 253.786
Lead Channel Impedance Value: 267.31 Ohm
Lead Channel Impedance Value: 267.31 Ohm
Lead Channel Impedance Value: 342 Ohm
Lead Channel Impedance Value: 4047 Ohm
Lead Channel Impedance Value: 418 Ohm
Lead Channel Impedance Value: 418 Ohm
Lead Channel Impedance Value: 456 Ohm
Lead Channel Impedance Value: 646 Ohm
Lead Channel Impedance Value: 646 Ohm
Lead Channel Impedance Value: 760 Ohm
Lead Channel Impedance Value: 779 Ohm
Lead Channel Impedance Value: 988 Ohm
Lead Channel Impedance Value: 988 Ohm
Lead Channel Impedance Value: 988 Ohm
Lead Channel Pacing Threshold Amplitude: 0.375 V
Lead Channel Pacing Threshold Pulse Width: 0.4 ms
Lead Channel Sensing Intrinsic Amplitude: 12.5 mV
Lead Channel Sensing Intrinsic Amplitude: 12.5 mV
Lead Channel Setting Pacing Amplitude: 2 V
Lead Channel Setting Pacing Amplitude: 2.5 V
Lead Channel Setting Pacing Pulse Width: 0.4 ms
Lead Channel Setting Pacing Pulse Width: 0.6 ms
Lead Channel Setting Sensing Sensitivity: 0.3 mV

## 2019-09-29 ENCOUNTER — Ambulatory Visit (INDEPENDENT_AMBULATORY_CARE_PROVIDER_SITE_OTHER): Payer: Medicare HMO

## 2019-09-29 DIAGNOSIS — I5032 Chronic diastolic (congestive) heart failure: Secondary | ICD-10-CM

## 2019-09-29 DIAGNOSIS — Z95810 Presence of automatic (implantable) cardiac defibrillator: Secondary | ICD-10-CM

## 2019-10-02 ENCOUNTER — Telehealth: Payer: Self-pay

## 2019-10-02 NOTE — Progress Notes (Signed)
EPIC Encounter for ICM Monitoring  Patient Name: [name] is a 63 y.o. male Date: 10/02/2019 Primary Care Physican: [name], MD Primary Cardiologist:[place] Electrophysiologist:Klein Bi-V Pacing:100% 07/20/2019 Weight: 220 lbs  Attempted call to patient and unable to reach.  Left detailed message per DPR regarding transmission. Transmission reviewed.   Optivol thoracic impedancenormal.  Prescribed:Torsemide 20 mg to 2 tablets (40 mg total)twice a day.  Labs: 09/23/2019 Creatinine 0.87, BUN 26, Potassium 3.9, Sodium 141, GFR 88.13 03/23/2019 Creatinine 1.05, BUN 31, Potassium 4.3, Sodium 141, GFR 71.05 11/24/2018 Creatinine 1.32, BUN 18, Potassium 4.5, Sodium 139, GFR 57->60 A complete set of results can be found in Results Review.  Recommendations: Left voice mail with ICM number and encouraged to call if experiencing any fluid symptoms.  Follow-up plan: ICM clinic phone appointment on 11/09/2019.   91 day device clinic remote transmission 12/28/2019.  Office appt 11/06/2019 with Dr. [name].    Copy of ICM check sent to Dr. [name].   3 month ICM trend: 09/28/2019    1 Year ICM trend:       [name], RN 10/02/2019 3:01 PM

## 2019-10-02 NOTE — Telephone Encounter (Signed)
Remote ICM transmission received.  Attempted call to patient regarding ICM remote transmission and left detailed message per DPR.  Advised to return call for any fluid symptoms or questions. Next ICM remote transmission scheduled 11/09/2019.

## 2019-10-03 ENCOUNTER — Other Ambulatory Visit: Payer: Self-pay | Admitting: Internal Medicine

## 2019-10-03 ENCOUNTER — Other Ambulatory Visit (HOSPITAL_COMMUNITY): Payer: Self-pay | Admitting: Cardiology

## 2019-10-03 DIAGNOSIS — L308 Other specified dermatitis: Secondary | ICD-10-CM

## 2019-10-15 ENCOUNTER — Other Ambulatory Visit: Payer: Self-pay | Admitting: Internal Medicine

## 2019-10-15 DIAGNOSIS — Z8709 Personal history of other diseases of the respiratory system: Secondary | ICD-10-CM

## 2019-10-15 DIAGNOSIS — J3089 Other allergic rhinitis: Secondary | ICD-10-CM

## 2019-10-15 NOTE — Telephone Encounter (Signed)
Medical screening examination/treatment/procedure(s) were performed by non-physician practitioner and as supervising physician I was immediately available for consultation/collaboration. I agree with above. James John, MD   

## 2019-10-15 NOTE — Telephone Encounter (Signed)
Per routine 

## 2019-10-15 NOTE — Telephone Encounter (Signed)
As per routine refill policy - I have not reviewed the details

## 2019-10-24 ENCOUNTER — Other Ambulatory Visit: Payer: Self-pay | Admitting: Internal Medicine

## 2019-10-24 NOTE — Telephone Encounter (Signed)
Done erx 

## 2019-10-31 ENCOUNTER — Other Ambulatory Visit (HOSPITAL_COMMUNITY): Payer: Self-pay | Admitting: Cardiology

## 2019-11-01 ENCOUNTER — Other Ambulatory Visit: Payer: Self-pay | Admitting: Internal Medicine

## 2019-11-01 NOTE — Progress Notes (Signed)
ICD remote 

## 2019-11-01 NOTE — Telephone Encounter (Signed)
Please refill as per office routine med refill policy (all routine meds refilled for 3 mo or monthly per pt preference up to one year from last visit, then month to month grace period for 3 mo, then further med refills will have to be denied)  

## 2019-11-02 DIAGNOSIS — L400 Psoriasis vulgaris: Secondary | ICD-10-CM | POA: Diagnosis not present

## 2019-11-06 ENCOUNTER — Telehealth (HOSPITAL_COMMUNITY): Payer: Self-pay

## 2019-11-06 ENCOUNTER — Telehealth: Payer: Self-pay | Admitting: *Deleted

## 2019-11-06 ENCOUNTER — Other Ambulatory Visit: Payer: Self-pay

## 2019-11-06 ENCOUNTER — Ambulatory Visit (HOSPITAL_BASED_OUTPATIENT_CLINIC_OR_DEPARTMENT_OTHER)
Admission: RE | Admit: 2019-11-06 | Discharge: 2019-11-06 | Disposition: A | Discharge status: Home / Self Care | Payer: Medicare HMO | Source: Ambulatory Visit | Attending: Cardiology | Admitting: Cardiology

## 2019-11-06 ENCOUNTER — Ambulatory Visit (HOSPITAL_COMMUNITY)
Admission: RE | Admit: 2019-11-06 | Discharge: 2019-11-06 | Disposition: A | Discharge status: Home / Self Care | Payer: Medicare HMO | Source: Ambulatory Visit | Attending: Cardiology | Admitting: Cardiology

## 2019-11-06 ENCOUNTER — Encounter (HOSPITAL_COMMUNITY): Payer: Self-pay | Admitting: Cardiology

## 2019-11-06 ENCOUNTER — Telehealth (HOSPITAL_COMMUNITY): Payer: Self-pay | Admitting: Pharmacist

## 2019-11-06 VITALS — BP 133/73 | HR 77 | Wt 235.6 lb

## 2019-11-06 DIAGNOSIS — Z79899 Other long term (current) drug therapy: Secondary | ICD-10-CM | POA: Insufficient documentation

## 2019-11-06 DIAGNOSIS — Z7951 Long term (current) use of inhaled steroids: Secondary | ICD-10-CM | POA: Insufficient documentation

## 2019-11-06 DIAGNOSIS — E785 Hyperlipidemia, unspecified: Secondary | ICD-10-CM | POA: Insufficient documentation

## 2019-11-06 DIAGNOSIS — I358 Other nonrheumatic aortic valve disorders: Secondary | ICD-10-CM | POA: Insufficient documentation

## 2019-11-06 DIAGNOSIS — I428 Other cardiomyopathies: Secondary | ICD-10-CM | POA: Insufficient documentation

## 2019-11-06 DIAGNOSIS — Z7901 Long term (current) use of anticoagulants: Secondary | ICD-10-CM | POA: Insufficient documentation

## 2019-11-06 DIAGNOSIS — G4739 Other sleep apnea: Secondary | ICD-10-CM

## 2019-11-06 DIAGNOSIS — M791 Myalgia, unspecified site: Secondary | ICD-10-CM | POA: Diagnosis not present

## 2019-11-06 DIAGNOSIS — Z87891 Personal history of nicotine dependence: Secondary | ICD-10-CM | POA: Insufficient documentation

## 2019-11-06 DIAGNOSIS — R7989 Other specified abnormal findings of blood chemistry: Secondary | ICD-10-CM

## 2019-11-06 DIAGNOSIS — J449 Chronic obstructive pulmonary disease, unspecified: Secondary | ICD-10-CM | POA: Insufficient documentation

## 2019-11-06 DIAGNOSIS — R21 Rash and other nonspecific skin eruption: Secondary | ICD-10-CM | POA: Diagnosis not present

## 2019-11-06 DIAGNOSIS — I2510 Atherosclerotic heart disease of native coronary artery without angina pectoris: Secondary | ICD-10-CM | POA: Insufficient documentation

## 2019-11-06 DIAGNOSIS — G4733 Obstructive sleep apnea (adult) (pediatric): Secondary | ICD-10-CM | POA: Diagnosis not present

## 2019-11-06 DIAGNOSIS — Z825 Family history of asthma and other chronic lower respiratory diseases: Secondary | ICD-10-CM | POA: Insufficient documentation

## 2019-11-06 DIAGNOSIS — I4821 Permanent atrial fibrillation: Secondary | ICD-10-CM | POA: Diagnosis not present

## 2019-11-06 DIAGNOSIS — I110 Hypertensive heart disease with heart failure: Secondary | ICD-10-CM | POA: Insufficient documentation

## 2019-11-06 DIAGNOSIS — I442 Atrioventricular block, complete: Secondary | ICD-10-CM | POA: Diagnosis not present

## 2019-11-06 DIAGNOSIS — Z7952 Long term (current) use of systemic steroids: Secondary | ICD-10-CM | POA: Insufficient documentation

## 2019-11-06 DIAGNOSIS — I4510 Unspecified right bundle-branch block: Secondary | ICD-10-CM | POA: Insufficient documentation

## 2019-11-06 DIAGNOSIS — I4819 Other persistent atrial fibrillation: Secondary | ICD-10-CM | POA: Diagnosis not present

## 2019-11-06 DIAGNOSIS — K219 Gastro-esophageal reflux disease without esophagitis: Secondary | ICD-10-CM | POA: Insufficient documentation

## 2019-11-06 DIAGNOSIS — Z95810 Presence of automatic (implantable) cardiac defibrillator: Secondary | ICD-10-CM | POA: Insufficient documentation

## 2019-11-06 DIAGNOSIS — I5022 Chronic systolic (congestive) heart failure: Secondary | ICD-10-CM | POA: Insufficient documentation

## 2019-11-06 DIAGNOSIS — Z6833 Body mass index (BMI) 33.0-33.9, adult: Secondary | ICD-10-CM | POA: Diagnosis not present

## 2019-11-06 DIAGNOSIS — E7849 Other hyperlipidemia: Secondary | ICD-10-CM | POA: Diagnosis not present

## 2019-11-06 DIAGNOSIS — E119 Type 2 diabetes mellitus without complications: Secondary | ICD-10-CM | POA: Diagnosis not present

## 2019-11-06 DIAGNOSIS — R9431 Abnormal electrocardiogram [ECG] [EKG]: Secondary | ICD-10-CM | POA: Insufficient documentation

## 2019-11-06 LAB — CBC
HCT: 42.8 % (ref 39.0–52.0)
Hemoglobin: 14.7 g/dL (ref 13.0–17.0)
MCH: 35.4 pg — ABNORMAL HIGH (ref 26.0–34.0)
MCHC: 34.3 g/dL (ref 30.0–36.0)
MCV: 103.1 fL — ABNORMAL HIGH (ref 80.0–100.0)
Platelets: 163 10*3/uL (ref 150–400)
RBC: 4.15 MIL/uL — ABNORMAL LOW (ref 4.22–5.81)
RDW: 13.6 % (ref 11.5–15.5)
WBC: 6.9 10*3/uL (ref 4.0–10.5)
nRBC: 0 % (ref 0.0–0.2)

## 2019-11-06 LAB — LIPID PANEL
Cholesterol: 206 mg/dL — ABNORMAL HIGH (ref 0–200)
HDL: 58 mg/dL (ref >40)
LDL Cholesterol: 136 mg/dL — ABNORMAL HIGH (ref 0–99)
Total CHOL/HDL Ratio: 3.6 RATIO
Triglycerides: 60 mg/dL (ref <150)
VLDL: 12 mg/dL (ref 0–40)

## 2019-11-06 LAB — COMPREHENSIVE METABOLIC PANEL
ALT: 84 U/L — ABNORMAL HIGH (ref 0–44)
AST: 48 U/L — ABNORMAL HIGH (ref 15–41)
Albumin: 4.1 g/dL (ref 3.5–5.0)
Alkaline Phosphatase: 46 U/L (ref 38–126)
Anion gap: 9 (ref 5–15)
BUN: 21 mg/dL (ref 8–23)
CO2: 28 mmol/L (ref 22–32)
Calcium: 9.4 mg/dL (ref 8.9–10.3)
Chloride: 102 mmol/L (ref 98–111)
Creatinine, Ser: 1.08 mg/dL (ref 0.61–1.24)
GFR calc Af Amer: >60 mL/min (ref >60)
GFR calc non Af Amer: >60 mL/min (ref >60)
Glucose, Bld: 92 mg/dL (ref 70–99)
Potassium: 4.5 mmol/L (ref 3.5–5.1)
Sodium: 139 mmol/L (ref 135–145)
Total Bilirubin: 0.6 mg/dL (ref 0.3–1.2)
Total Protein: 6.6 g/dL (ref 6.5–8.1)

## 2019-11-06 LAB — DIGOXIN LEVEL: Digoxin Level: 0.2 ng/mL — ABNORMAL LOW (ref 0.8–2.0)

## 2019-11-06 MED ORDER — SACUBITRIL-VALSARTAN 24-26 MG PO TABS: 1.0000 | ORAL_TABLET | Freq: Two times a day (BID) | ORAL | 5 refills | Status: DC

## 2019-11-06 NOTE — Telephone Encounter (Signed)
Obtained PAN grant to assist with Entresto copay.  Member ID: [id number] Group ID: [id number] RxBin ID: [id number] PCN: [id number] Eligibility Start Date: 08/08/2019 Eligibility End Date: 11/04/2020 Assistance Amount: $1,000.00  Audry Riles, PharmD, BCPS, BCCP, CPP Heart Failure Clinic Pharmacist [phone]

## 2019-11-06 NOTE — Telephone Encounter (Signed)
-----   Message from Larey Dresser, MD sent at 11/06/2019  4:10 PM EST ----- LFTs are elevated, this is new.  Repeat LFTs in 1 week to see if there is an uptrend.  LDL is still significantly elevated.  Is he taking Crestor 5 mg daily.

## 2019-11-06 NOTE — Progress Notes (Signed)
  Echocardiogram 2D Echocardiogram has been performed.  Darlina Sicilian M 11/06/2019, 10:02 AM

## 2019-11-06 NOTE — Patient Instructions (Addendum)
START  Entresto 24/26mg  (1 tab) twice a day   START Eliquis 5mg  (1 tab) twice a day   Labs today We will only contact you if something comes back abnormal or we need to make some changes. Otherwise no news is good news!   Repeat labs in 10 days: Monday, February 1st, 2021 at 12:30p, GARAGE CODE 6008   You have been referred to Dr Radford Pax for CPAP.  Her office will call you to schedule an appointment.    Your physician recommends that you schedule a follow-up appointment in: 3 months with Dr [name] on Tuesday April 20th, 2021 at 12pm [facility]   Please call office at [phone] option 2 if you have any questions or concerns.    At the [facility], you and your health needs are our priority. As part of our continuing mission to provide you with exceptional heart care, we have created designated Provider Care Teams. These Care Teams include your primary Cardiologist (physician) and Advanced Practice Providers (APPs- Physician Assistants and Nurse Practitioners) who all work together to provide you with the care you need, when you need it.   You may see any of the following providers on your designated Care Team at your next follow up: [name] Dr Glori Bickers . Dr Loralie Champagne . Darrick Grinder, NP . Lyda Jester, PA . Audry Riles, PharmD   Please be sure to bring in all your medications bottles to every appointment.

## 2019-11-06 NOTE — Telephone Encounter (Signed)
-----   Message from [facility] sent at 11/06/2019 11:14 AM EST ----- Regarding: Referral Referral per Dr. [name] for Dr. Radford Pax sleep study - Thank you!

## 2019-11-07 NOTE — Progress Notes (Signed)
Date:  09/24/2019   ID:  [name], DOB Feb 17, 1956, MRN [id number]  Provider location:  Advanced Heart Failure Type of Visit: Established patient  PCP:  [name], MD  Cardiologist:  Dr. [name]  Chief Complaint: Fatigue   History of Present Illness: [name] is a 64 y.o. male who has history of COPD on home oxygen, permanent atrial fibrillation, and chronic systolic CHF.  Amiodarone and DCCV were tried for atrial fibrillation in the past without success.   Patient has been noted to have a low EF since 2013.  EF improved by to normal by 2015 but dropped to 30-35% by 11/16.  Cath in 6/17 showed some CAD but not enough to cause cardiomyopathy.  He was admitted in 3/18 with PNA, atrial fibrillation/RVR, and acute on chronic systolic CHF.  Echo in 3/18 showed fall in EF to 10-15%.   I took him for right and left heart cath in 4/18, which showed nonobstructive CAD and relatively optimized filling pressures.    Syncope in 11/18. Loop recorder showed 6 second pause and periods of complete heart block. He had Medtronic BiV ICD placed in 12/18.  In 1/19, he had AV nodal ablation to promote BiV pacing.   Echo was done 6/19 with EF 25-30% with moderate RV dilation/mildly decreased systolic function. CPX 7/19 was submaximal, probably mild functional impairment.    Echo was done today and reviewed, EF 35-40% with diffuse hypokinesis, moderate RV dilation with mildly decreased systolic function.   Patient returns for followup of CHF. He has gained weight recently as he has not been going to the [facility] with coronavirus.  Generally doing well.  He is not short of breath walking on flat ground.  No chest pain.  Still sleeps in a recliner, but this may be due to OSA (not using CPAP).  He has not been taking warfarin due to recent oozing from skin lesions (spongiotic dermatitis), for which he remains on prednisone.   Labs (4/18): pro-BNP 904, K 3.1, creatinine 1.39 => 1.08 with BUN 66 =>  37, hgb 14.1.  Labs (5/18): hemoglobin 13.1 Labs (6/18): K 4, creatinine 1.3, BNP 294 Labs (7/18): K 3.8, creatinine 1.23, LDL 96, HDL 42 Labs (8/18): K 4.5, creatinine 1.22 Labs (10/18): digoxin 0.4 Labs (12/18): LDL 115, HDL 37, TGs 214, K 4.2, creatinine 0.84 Labs (1/19): K 4.4, creatinine 0.75, pro-BNP 433 Labs (2/19): digoxin 0.4 Labs (3/19): LDL 121, hgb 12.8, K 4.5, creatinine 0.98 Labs (6/19): LDL 48, HDL 45, K 5, creatinine 0.94, digoxin 0.3 Labs (9/19): K 3.7, creatinine 1.09 => 1.32, digoxin 0.2 Labs (11/19): LDL 115 Labs (12/20): K 3.9, creatinine 0.87, LDL 94   PMH: 1. Asthma 2. COPD: On home oxygen. Prior smoker.  3. Atrial fibrillation: Permanent.  He failed DCCV and amiodarone in the past . 4. HTN 5. GERD 6. Hyperlipidemia: Myalgias with atorvastatin 7. BPH.  8. Chronic systolic CHF: Nonischemic cardiomyopathy.  - EF 25% by TEE in 7/13.  Normalized on 2015 echo. - Echo (11/16): EF 30-35%.  - LHC/RHC (6/17): 80% ostial stenosis small D1, 30-40% pLAD.  Mean RA 4, PA 26/10, mean PCWP 11, CI 2.33.  - Echo (3/18): EF 10-15%, diffuse hypokinesis, severe LV dilation, moderate RV dilation with severely decreased systolic function.  - LHC/RHC (4/18): 40% proximal LAD.  Mean RA 8, PA 37/10 mean 22, mean PCWP 22, LVEDP 9, CI 2.39. - Medtronic BiV ICD placement in 12/18 with AV nodal ablation to promote BiV  pacing in 1/19.  - Echo (6/19): EF 25-30% with mild LV dilation, moderately dilated RV with mildly decreased systolic function, IVC not dilated, PASP 23 mmHg.  - CPX (7/19): peak VO2 17.6 (78% predicted), VE/VCO2 slope 31, RER 1.02 => submaximal, probably mild functional impairment.  9. Morbid obesity 10. ILR in place.  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 Complete heart block: s/p Medtronic CRT-D.  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 OSA: Untreated.   Social History   Socioeconomic History  . Marital status: Divorced    Spouse name: Not on file  . Number of children:  2  . Years of education: Not on file  . Highest education level: Not on file  Occupational History  . Occupation: retired/disabled. prev worked in Therapist, sports.    Employer: DISABLED  Tobacco Use  . Smoking status: Former Smoker    Packs/day: 2.00    Years: 30.00    Pack years: 60.00    Types: Cigarettes    Quit date: 10/16/2007    Years since quitting: 12.0  . Smokeless tobacco: Never Used  Substance and Sexual Activity  . Alcohol use: No  . Drug use: No  . Sexual activity: Not Currently  Other Topics Concern  . Not on file  Social History Narrative   Lives alone.   Social Determinants of Health   Financial Resource Strain:   . Difficulty of Paying Living Expenses: Not on file  Food Insecurity:   . Worried About Charity fundraiser in the Last Year: Not on file  . Ran Out of Food in the Last Year: Not on file  Transportation Needs:   . Lack of Transportation (Medical): Not on file  . Lack of Transportation (Non-Medical): Not on file  Physical Activity:   . Days of Exercise per Week: Not on file  . Minutes of Exercise per Session: Not on file  Stress:   . Feeling of Stress : Not on file  Social Connections:   . Frequency of Communication with Friends and Family: Not on file  . Frequency of Social Gatherings with Friends and Family: Not on file  . Attends Religious Services: Not on file  . Active Member of Clubs or Organizations: Not on file  . Attends Archivist Meetings: Not on file  . Marital Status: Not on file  Intimate Partner Violence:   . Fear of Current or Ex-Partner: Not on file  . Emotionally Abused: Not on file  . Physically Abused: Not on file  . Sexually Abused: Not on file   Family History  Problem Relation Age of Onset  . COPD Mother   . Asthma Mother   . Colon polyps Mother   . Allergies Mother   . Hypothyroidism Mother   . Asthma Maternal Grandmother   . Colon cancer Neg Hx    ROS: All systems reviewed and negative except as  per HPI.   Current Outpatient Medications  Medication Sig Dispense Refill  . acetaminophen (TYLENOL) 500 MG tablet Take 500 mg by mouth every 6 (six) hours as needed.    [name] albuterol (VENTOLIN HFA) 108 (90 Base) MCG/ACT inhaler INHALE 2 PUFFS 4 TIMES A DAY as needed 54 Inhaler 3  . ALPRAZolam (XANAX) 0.5 MG tablet TAKE 2 TABLETS BY MOUTH AT BEDTIME 60 tablet 2  . apixaban (ELIQUIS) 5 MG TABS tablet Take 1 tablet (5 mg total) by mouth 2 (two) times daily. 180 tablet 3  . carisoprodol (SOMA) 350 MG tablet TAKE 1 TABLET BY  MOUTH THREE TIMES A DAY AS NEEDED FOR MUSCLE SPASMS 90 tablet 5  . carvedilol (COREG) 12.5 MG tablet TAKE 1 TABLET (12.5 MG TOTAL) BY MOUTH 2 (TWO) TIMES DAILY WITH A MEAL. 180 tablet 3  . cephALEXin (KEFLEX) 500 MG capsule Take 1 capsule (500 mg total) by mouth 2 (two) times daily. 14 capsule 0  . clobetasol ointment (TEMOVATE) [id number] % Apply 1 application topically 2 (two) times daily. 60 g 1  . digoxin (LANOXIN) 0.25 MG tablet TAKE 1/2 TABLET BY MOUTH EVERY DAY 45 tablet 0  . doxepin (SINEQUAN) 10 MG capsule TAKE 2 CAPSULES BY MOUTH AT BEDTIME. ANNUAL APPT DUE IN JUNE MUST SEE PROVIDER FOR FUTURE REFILLS 180 capsule 1  . ezetimibe (ZETIA) 10 MG tablet Take 1 tablet (10 mg total) by mouth daily. 90 tablet 3  . fluticasone (FLONASE) 50 MCG/ACT nasal spray PLACE 2 SPRAYS INTO BOTH NOSTRILS DAILY AS NEEDED FOR ALLERGIES OR RHINITIS. 48 mL 1  . Fluticasone-Salmeterol (ADVAIR DISKUS) 250-50 MCG/DOSE AEPB Inhale 1 puff into the lungs 2 (two) times daily. 180 each 3  . hydrocortisone 2.5 % cream APPLY TO AFFECTED AREA TWICE A DAY 28.35 g 6  . hydrOXYzine (ATARAX/VISTARIL) 10 MG tablet TAKE 1 TABLET BY MOUTH THREE TIMES A DAY AS NEEDED 60 tablet 2  . ketoconazole (NIZORAL) 2 % cream APPLY TO AFFECTED AREA EVERY DAY 15 g 0  . lidocaine (LIDODERM) 5 % APPLY 1 PATCH TO AFFECTED AREA FOR UP TO 12 HOURS . DO NOT USE MORE THAN ONE PATCH IN 24 HOURS. 90 patch 1  . methotrexate (RHEUMATREX) 2.5  MG tablet TAKE 4 TABLETS BY MOUTH EVERY WEEK 48 tablet 1  . omeprazole (PRILOSEC) 20 MG capsule Take 1 capsule (20 mg total) by mouth daily with supper. 90 capsule 1  . predniSONE (DELTASONE) 10 MG tablet 3 tabs by mouth per day for 3 days,2tabs per day for 3 days,1tab per day for 3 days 18 tablet 0  . rosuvastatin (CRESTOR) 5 MG tablet Take 1 tablet (5 mg total) by mouth daily. 90 tablet 3  . sertraline (ZOLOFT) 100 MG tablet TAKE 2 TABLETS BY MOUTH AT BEDTIME 180 tablet 3  . silver sulfADIAZINE (SILVADENE) 1 % cream APPLY TOPICALLY TO AFFECTED AREA EVERY DAY 50 g 3  . spironolactone (ALDACTONE) 25 MG tablet TAKE 1 TABLET BY MOUTH EVERY DAY 90 tablet 1  . torsemide (DEMADEX) 20 MG tablet TAKE 2 TABLETS BY MOUTH TWICE A DAY 360 tablet 1  . traMADol (ULTRAM) 50 MG tablet Take 1 tablet (50 mg total) by mouth daily as needed. 30 tablet 5  . traZODone (DESYREL) 50 MG tablet TAKE 1 TABLET (50 MG TOTAL) BY MOUTH AT BEDTIME AS NEEDED FOR SLEEP. 90 tablet 1  . triamcinolone (KENALOG) 0.1 % paste Use as directed 1 application in the mouth or throat 2 (two) times daily. 5 g 2  . TUMS 500 MG chewable tablet Chew 500-2,000 mg by mouth every 4 (four) hours as needed for indigestion or heartburn.     . valACYclovir (VALTREX) 1000 MG tablet TAKE 1 TABLET BY MOUTH THREE TIMES A DAY 21 tablet 2  . venlafaxine XR (EFFEXOR-XR) 37.5 MG 24 hr capsule TAKE ONE CAPSULE BY MOUTH EVERY DAY WITH BREAKFAST 90 capsule 1  . zolpidem (AMBIEN) 10 MG tablet TAKE 1 TABLET BY MOUTH EVERYDAY AT BEDTIME 90 tablet 1  . sacubitril-valsartan (ENTRESTO) 24-26 MG Take 1 tablet by mouth 2 (two) times daily. 60 tablet 5  No current facility-administered medications for this encounter.   BP 133/73   Pulse 77   Wt 106.9 kg (235 lb 9.6 oz)   SpO2 99%   BMI 33.81 kg/m  General: NAD Neck: No JVD, no thyromegaly or thyroid nodule.  Lungs: Clear to auscultation bilaterally with normal respiratory effort. CV: Nondisplaced PMI.  Heart  regular S1/S2, no S3/S4, no murmur.  No peripheral edema.  No carotid bruit.  Normal pedal pulses.  Abdomen: Soft, nontender, no hepatosplenomegaly, no distention.  Skin: Intact without lesions or rashes.  Neurologic: Alert and oriented x 3.  Psych: Normal affect. Extremities: No clubbing or cyanosis.  HEENT: Normal.   Assessment/Plan:  1. Atrial fibrillation: Permanent.  Now s/p AV nodal ablation to allow effective BiV pacing.  - He needs to restart anticoagulation, I will have him start Eliquis 5 mg bid.  2. COPD: No longer smoking.  He is on home oxygen at night.   3. Chronic systolic CHF: Nonischemic cardiomyopathy based on 6/17 cath. However, he had a recent significant fall in EF from 30-35% to 10-15% on echo from 3/18.  LHC/RHC in 4/18 showed nonobstructive CAD and relatively optimized filling pressures.  He now has Medtronic CRT-D device with AV nodal ablation to allow BiV pacing.  Echo 6/19 with EF 25-30% with mildly decreased RV systolic function. CPX 7/19 was submaximal but likely only mild functional limitation from CHF.  Echo was done today and reviewed, EF 35-40%.  Improved NYHA class II symptoms.   - Continue Coreg 12.5 mg bid.   - Continue spironolactone 25 mg daily.        - I am going to start him on Entresto 24/26 bid, BMET today and again in 10 days  - Continue torsemide 40 mg bid.  - Continue digoxin 0.125 daily, check level.  4. CAD: Nonobstructive on 4/18 cath.  No exertional chest pain.  - He will be on Eliquis so no ASA.  - Stopped atorvastatin and Zetia due to myalgias.  He is tolerating Crestor so far, will check lipids/LFTs today.  5. Complete heart block: Now has Medtronic CRT-D device and s/p AV nodal ablation.  6. Rash: Spongiotic dermatitis.  Now on methotrexate and prednisone, much improved.  7. OSA: Patient has untreated OSA, I will refer him to Dr. Radford Pax to see if he can get nasal prongs with CPAP.   followup 3 months.   Signed, Loralie Champagne, MD   11/07/2019  [facility] [address] [facility] [place] [address] [phone] (office) [phone] (fax)

## 2019-11-09 ENCOUNTER — Telehealth (HOSPITAL_COMMUNITY): Payer: Self-pay

## 2019-11-09 ENCOUNTER — Ambulatory Visit (INDEPENDENT_AMBULATORY_CARE_PROVIDER_SITE_OTHER): Payer: Medicare HMO

## 2019-11-09 DIAGNOSIS — I5022 Chronic systolic (congestive) heart failure: Secondary | ICD-10-CM

## 2019-11-09 DIAGNOSIS — Z95810 Presence of automatic (implantable) cardiac defibrillator: Secondary | ICD-10-CM | POA: Diagnosis not present

## 2019-11-09 DIAGNOSIS — G4739 Other sleep apnea: Secondary | ICD-10-CM

## 2019-11-09 DIAGNOSIS — R7989 Other specified abnormal findings of blood chemistry: Secondary | ICD-10-CM

## 2019-11-09 NOTE — Telephone Encounter (Signed)
Pt is coming tomorrow for recheck of LFT's

## 2019-11-09 NOTE — Telephone Encounter (Signed)
-----   Message from Larey Dresser, MD sent at 11/06/2019  4:30 PM EST ----- Repeat LFTs before increasing Crestor

## 2019-11-10 ENCOUNTER — Other Ambulatory Visit: Payer: Self-pay

## 2019-11-10 ENCOUNTER — Other Ambulatory Visit: Payer: Self-pay | Admitting: Internal Medicine

## 2019-11-10 ENCOUNTER — Ambulatory Visit (HOSPITAL_COMMUNITY)
Admission: RE | Admit: 2019-11-10 | Discharge: 2019-11-10 | Disposition: A | Discharge status: Home / Self Care | Payer: Medicare HMO | Source: Ambulatory Visit | Attending: Cardiology | Admitting: Cardiology

## 2019-11-10 DIAGNOSIS — I4819 Other persistent atrial fibrillation: Secondary | ICD-10-CM

## 2019-11-10 DIAGNOSIS — R7989 Other specified abnormal findings of blood chemistry: Secondary | ICD-10-CM | POA: Diagnosis not present

## 2019-11-10 DIAGNOSIS — I5022 Chronic systolic (congestive) heart failure: Secondary | ICD-10-CM | POA: Insufficient documentation

## 2019-11-10 DIAGNOSIS — I428 Other cardiomyopathies: Secondary | ICD-10-CM

## 2019-11-10 DIAGNOSIS — I255 Ischemic cardiomyopathy: Secondary | ICD-10-CM

## 2019-11-10 LAB — HEPATIC FUNCTION PANEL
ALT: 51 U/L — ABNORMAL HIGH (ref 0–44)
AST: 27 U/L (ref 15–41)
Albumin: 4.2 g/dL (ref 3.5–5.0)
Alkaline Phosphatase: 51 U/L (ref 38–126)
Bilirubin, Direct: 0.1 mg/dL (ref 0.0–0.2)
Indirect Bilirubin: 0.8 mg/dL (ref 0.3–0.9)
Total Bilirubin: 0.9 mg/dL (ref 0.3–1.2)
Total Protein: 6.8 g/dL (ref 6.5–8.1)

## 2019-11-10 NOTE — Progress Notes (Signed)
EPIC Encounter for ICM Monitoring  Patient Name: [name] is a 64 y.o. male Date: 11/10/2019 Primary Care Physican: [name], MD Primary Cardiologist:[place] Electrophysiologist:Klein Bi-V Pacing:100% 11/10/2019 Weight: 225 lbs   Spoke with patient and he has gained 5 lbs within the last week.  He has not been taking Torsemide as prescribed.    Optivol thoracic impedance suggesting possible fluid accumulation since 11/03/2019.  Prescribed:Torsemide 20 mg to 2 tablets (40 mg total)twice a day.  Labs: 11/06/2019 Creatinine 1.08, BUN 21, Potassium 4.5, Sodium 139, GFR >60 09/23/2019 Creatinine 0.87, BUN 26, Potassium 3.9, Sodium 141, GFR 88.13 03/23/2019 Creatinine 1.05, BUN 31, Potassium 4.3, Sodium 141, GFR 71.05 11/24/2018 Creatinine 1.32, BUN 18, Potassium 4.5, Sodium 139, GFR 57->60 A complete set of results can be found in Results Review.  Recommendations:  Advised to take Torsemide 40 mg bid as prescribed and limit salt/fluid intake.    Follow-up plan: ICM clinic phone appointment on 11/13/2019 to recheck fluid levels.   91 day device clinic remote transmission 12/28/2019.   Office appt 12/28/2019 with Dr. [name].    Copy of ICM check sent to Dr. [name] and Dr [name].  3 month ICM trend: 11/09/2019    1 Year ICM trend:       [name], RN 11/10/2019 12:16 PM

## 2019-11-11 ENCOUNTER — Telehealth: Payer: Self-pay | Admitting: *Deleted

## 2019-11-11 ENCOUNTER — Other Ambulatory Visit (HOSPITAL_COMMUNITY): Payer: Self-pay

## 2019-11-11 MED ORDER — APIXABAN 5 MG PO TABS: 5.0000 mg | ORAL_TABLET | Freq: Two times a day (BID) | ORAL | 3 refills | Status: DC

## 2019-11-11 NOTE — Telephone Encounter (Signed)
-----   Message from Freada Bergeron, [place] sent at 11/06/2019  5:26 PM EST ----- Regarding: precert Split night ----- Message ----- From: Imagene Gurney Sent: 11/06/2019  11:14 AM EST To: Freada Bergeron, CMA Subject: Referral                                       Referral per Dr. [name] for Dr. Radford Pax sleep study - Thank you!

## 2019-11-11 NOTE — Telephone Encounter (Signed)
Staff message sent to [facility] ok to schedule sleep study. [facility] auth # [id number]. Valid dates 11/16/19 to 12/16/19.

## 2019-11-11 NOTE — Telephone Encounter (Signed)
PA submitted to [employer] for sleep study via web portal. 

## 2019-11-11 NOTE — Telephone Encounter (Signed)
Medical screening examination/treatment/procedure(s) were performed by non-physician practitioner and as supervising physician I was immediately available for consultation/collaboration. I agree with above. Jeran Hiltz, MD   

## 2019-11-12 ENCOUNTER — Other Ambulatory Visit (HOSPITAL_COMMUNITY): Payer: Self-pay

## 2019-11-13 ENCOUNTER — Ambulatory Visit (INDEPENDENT_AMBULATORY_CARE_PROVIDER_SITE_OTHER): Payer: Medicare HMO

## 2019-11-13 ENCOUNTER — Telehealth: Payer: Self-pay | Admitting: *Deleted

## 2019-11-13 DIAGNOSIS — Z95810 Presence of automatic (implantable) cardiac defibrillator: Secondary | ICD-10-CM

## 2019-11-13 DIAGNOSIS — I5022 Chronic systolic (congestive) heart failure: Secondary | ICD-10-CM

## 2019-11-13 NOTE — Telephone Encounter (Signed)
-----   Message from Lauralee Evener, [place] sent at 11/11/2019  5:20 PM EST ----- Regarding: RE: precert [employer] auth received ok to schedule sleep study. Auth # [id number]. Valid dates 11/16/19 to 12/16/19. ----- Message ----- From: Freada Bergeron, CMA Sent: 11/06/2019   5:26 PM EST To: [facility] Subject: precert                                        Split night ----- Message ----- From: Imagene Gurney Sent: 11/06/2019  11:14 AM EST To: Freada Bergeron, CMA Subject: Referral                                       Referral per Dr. [name] for Dr. Radford Pax sleep study - Thank you!

## 2019-11-13 NOTE — Telephone Encounter (Signed)
Patient is scheduled for lab study on 11/28/19. Pt is scheduled for COVID screening on 11/25/19 1:45 prior to SS. Patient understands he sleep study will be done at [facility] sleep lab. Patient understands he will receive a sleep packet in a week or so. Patient understands to call if he does not receive the sleep packet in a timely manner. Patient agrees with treatment and thanked me for call.

## 2019-11-13 NOTE — Progress Notes (Signed)
EPIC Encounter for ICM Monitoring  Patient Name: [name] is a 64 y.o. male Date: 11/13/2019 Primary Care Physican: [name], MD Primary Cardiologist:[facility] Electrophysiologist:Klein Bi-V Pacing:100% 11/13/2019 Weight: 225 lbs   Spoke with patient and he is asymptomatic.    Optivol thoracic impedance returned to normal after taking Torsemide as prescribed twice a day.  Prescribed:Torsemide 20 mg to 2 tablets (40 mg total)twice a day.  Labs: 11/06/2019 Creatinine 1.08, BUN 21, Potassium 4.5, Sodium 139, GFR >60 09/23/2019 Creatinine 0.87, BUN 26, Potassium 3.9, Sodium 141, GFR 88.13 03/23/2019 Creatinine 1.05, BUN 31, Potassium 4.3, Sodium 141, GFR 71.05 11/24/2018 Creatinine 1.32, BUN 18, Potassium 4.5, Sodium 139, GFR 57->60 A complete set of results can be found in Results Review.  Recommendations: No changes and encouraged to call if experiencing any fluid symptoms.  Follow-up plan: ICM clinic phone appointment on3/10/2019. 91 day device clinic remote transmission 12/28/2019.   Office appt 12/28/2019 with Dr. [name].    3 month ICM trend: 11/13/2019    1 Year ICM trend:       [name], RN 11/13/2019 1:36 PM

## 2019-11-16 ENCOUNTER — Ambulatory Visit (HOSPITAL_COMMUNITY)
Admission: RE | Admit: 2019-11-16 | Discharge: 2019-11-16 | Disposition: A | Discharge status: Home / Self Care | Payer: Medicare HMO | Source: Ambulatory Visit | Attending: Internal Medicine | Admitting: Internal Medicine

## 2019-11-16 ENCOUNTER — Other Ambulatory Visit: Payer: Self-pay | Admitting: Internal Medicine

## 2019-11-16 ENCOUNTER — Other Ambulatory Visit: Payer: Self-pay

## 2019-11-16 DIAGNOSIS — I5022 Chronic systolic (congestive) heart failure: Secondary | ICD-10-CM | POA: Diagnosis not present

## 2019-11-16 DIAGNOSIS — R7989 Other specified abnormal findings of blood chemistry: Secondary | ICD-10-CM | POA: Diagnosis not present

## 2019-11-16 LAB — BASIC METABOLIC PANEL
Anion gap: 10 (ref 5–15)
BUN: 21 mg/dL (ref 8–23)
CO2: 29 mmol/L (ref 22–32)
Calcium: 9.5 mg/dL (ref 8.9–10.3)
Chloride: 101 mmol/L (ref 98–111)
Creatinine, Ser: 1.06 mg/dL (ref 0.61–1.24)
GFR calc Af Amer: >60 mL/min (ref >60)
GFR calc non Af Amer: >60 mL/min (ref >60)
Glucose, Bld: 103 mg/dL — ABNORMAL HIGH (ref 70–99)
Potassium: 4.2 mmol/L (ref 3.5–5.1)
Sodium: 140 mmol/L (ref 135–145)

## 2019-11-16 LAB — HEPATIC FUNCTION PANEL
ALT: 32 U/L (ref 0–44)
AST: 23 U/L (ref 15–41)
Albumin: 4 g/dL (ref 3.5–5.0)
Alkaline Phosphatase: 50 U/L (ref 38–126)
Bilirubin, Direct: 0.1 mg/dL (ref 0.0–0.2)
Indirect Bilirubin: 0.6 mg/dL (ref 0.3–0.9)
Total Bilirubin: 0.7 mg/dL (ref 0.3–1.2)
Total Protein: 6.6 g/dL (ref 6.5–8.1)

## 2019-11-16 NOTE — Telephone Encounter (Signed)
Patient called to cancel his test until he is able to get his covid 19 vaccine. He does not know when that might be at this time.

## 2019-11-25 ENCOUNTER — Other Ambulatory Visit: Payer: Self-pay | Admitting: Family Medicine

## 2019-11-25 ENCOUNTER — Other Ambulatory Visit (HOSPITAL_COMMUNITY): Payer: Medicare HMO

## 2019-11-25 ENCOUNTER — Other Ambulatory Visit (HOSPITAL_COMMUNITY): Payer: Self-pay | Admitting: Cardiology

## 2019-11-27 ENCOUNTER — Encounter: Payer: Self-pay | Admitting: Internal Medicine

## 2019-11-27 ENCOUNTER — Ambulatory Visit (INDEPENDENT_AMBULATORY_CARE_PROVIDER_SITE_OTHER): Payer: Medicare HMO | Admitting: Internal Medicine

## 2019-11-27 DIAGNOSIS — J069 Acute upper respiratory infection, unspecified: Secondary | ICD-10-CM | POA: Insufficient documentation

## 2019-11-27 DIAGNOSIS — Z20828 Contact with and (suspected) exposure to other viral communicable diseases: Secondary | ICD-10-CM | POA: Diagnosis not present

## 2019-11-27 DIAGNOSIS — J441 Chronic obstructive pulmonary disease with (acute) exacerbation: Secondary | ICD-10-CM | POA: Diagnosis not present

## 2019-11-27 DIAGNOSIS — E119 Type 2 diabetes mellitus without complications: Secondary | ICD-10-CM | POA: Diagnosis not present

## 2019-11-27 MED ORDER — ALBUTEROL SULFATE HFA 108 (90 BASE) MCG/ACT IN AERS: INHALATION_SPRAY | RESPIRATORY_TRACT | 3 refills | Status: DC

## 2019-11-27 MED ORDER — LEVOFLOXACIN 500 MG PO TABS: 500.0000 mg | ORAL_TABLET | Freq: Every day | ORAL | 0 refills | Status: AC

## 2019-11-27 MED ORDER — HYDROCODONE-HOMATROPINE 5-1.5 MG/5ML PO SYRP: 5.0000 mL | ORAL_SOLUTION | Freq: Four times a day (QID) | ORAL | 0 refills | Status: AC | PRN

## 2019-11-27 MED ORDER — PREDNISONE 10 MG PO TABS: ORAL_TABLET | ORAL | 0 refills | Status: DC

## 2019-11-27 NOTE — Assessment & Plan Note (Addendum)
Mild to mod, for predpac asd,  to f/u any worsening symptoms or concerns  I spent 31 minutes in preparing to see the patient by review of recent labs, imaging and procedures, obtaining and reviewing separately obtained history, communicating with the patient and family or caregiver, ordering medications, tests or procedures, and documenting clinical information in the EHR including the differential Dx, treatment, and any further evaluation and other management of copd exacerbation, URI, DM

## 2019-11-27 NOTE — Assessment & Plan Note (Signed)
stable overall by history and exam, recent data reviewed with pt, and pt to continue medical treatment as before,  to f/u any worsening symptoms or concerns  

## 2019-11-27 NOTE — Patient Instructions (Signed)
Please take all new medication as prescribed - the antibiotic, cough medicine and prednisone  Please continue all other medications as before, and refills have been done if requested.  Please have the pharmacy call with any other refills you may need.  Please keep your appointments with your specialists as you may have planned     

## 2019-11-27 NOTE — Assessment & Plan Note (Signed)
Mild to mod, for antibx course,  Cough med prn,to f/u any worsening symptoms or concerns 

## 2019-11-27 NOTE — Progress Notes (Signed)
Patient ID: [name], male   DOB: 11-05-1955, 64 y.o.   MRN: [id number]  Virtual Visit via Video Note  I connected with [name] on 11/27/19 at  1:40 PM EST by a video enabled telemedicine application and verified that I am speaking with the correct person using two identifiers.  Location: Patient: at home Provider: at office   I discussed the limitations of evaluation and management by telemedicine and the availability of in person appointments. The patient expressed understanding and agreed to proceed.  History of Present Illness:  Here with 2-3 days acute onset fever, facial pain, pressure, headache, general weakness and malaise, and greenish d/c, with mild ST and cough, but pt denies chest pain, wheezing, increased sob or doe, orthopnea, PND, increased LE swelling, palpitations, dizziness or syncope, except for mild wheezing and cough x 1 day with sob.  Pt denies polydipsia, polyuria, Past Medical History:  Diagnosis Date  . AICD (automatic cardioverter/defibrillator) present   . Anemia    supposed to be taking Vit B but doesn't  . ANXIETY    takes Xanax nightly  . Arthritis   . Asthma    Albuterol prn and Advair daily;also takes Prednisone daily  . Atrial fibrillation ([place]) 09/06/2015  . Cardiomyopathy [facility])    a. EF 25% TEE July 2013; b. EF normalized 2015;  c. 03/2015 Echo: EF 40-45%, difrf HK, PASP 38 mmHg, Mild MR, sev LAE/RAE.  [name] Chronic constipation    takes OTC stool softener  . COPD (chronic obstructive pulmonary disease) ([place])    "one dr says COPD; one dr says emphysema" (09/18/2017)  . DEPRESSION    takes Zoloft and Doxepin daily  . Diverticulitis   . DYSKINESIA, ESOPHAGUS   . Emphysema of lung [facility])    "one dr says COPD; one dr says emphysema" (09/18/2017)  . Essential hypertension       . FIBROMYALGIA   . GERD (gastroesophageal reflux disease)       . Glaucoma   . HYPERLIPIDEMIA    a. Intolerant to statins.  . INSOMNIA    takes Ambien nightly  .  Myocardial infarction [facility])    a. 2012 Myoview notable for prior infarct;  b. 03/2015 Lexiscan CL: EF 37%, diff HK, small area of inferior infarct from apex to base-->Med Rx.  . Myocardial infarction ([place])   . O2 dependent    "2.5L q hs & prn" (09/18/2017)  . Paroxysmal atrial fibrillation ([facility])    a. CHA2DS2VASc = 3--> takes Coumadin;  b. 03/15/2015 Successful TEE/DCCV;  c. 03/2015 recurrent afib, Amio d/c'd in setting of hyperthyroidism.  . Peripheral neuropathy   . Pneumonia 12/2016  . Rash and other nonspecific skin eruption 04/12/2009   no cause found saw dermatologists x 2 and allergist  . SLEEP APNEA, OBSTRUCTIVE    a. doesn't use CPAP  . Syncope    a. 03/2015 s/p MDT LINQ.  [name] Type II diabetes mellitus ([place])        Past Surgical History:  Procedure Laterality Date  . ACNE CYST REMOVAL     2 on back   . AV NODE ABLATION N/A 10/25/2017   Procedure: AV NODE ABLATION;  Surgeon: Deboraha Sprang, MD;  Location: [address] CV LAB;  Service: Cardiovascular;  Laterality: N/A;  . BIV ICD INSERTION CRT-D N/A 09/18/2017   Procedure: BIV ICD INSERTION CRT-D;  Surgeon: Deboraha Sprang, MD;  Location: [place] CV LAB;  Service: Cardiovascular;  Laterality: N/A;  . CARDIAC  CATHETERIZATION N/A 03/21/2016   Procedure: Right/Left Heart Cath and Coronary Angiography;  Surgeon: Larey Dresser, MD;  Location: [place] CV LAB;  Service: Cardiovascular;  Laterality: N/A;  . CARDIOVERSION  04/18/2012   Procedure: CARDIOVERSION;  Surgeon: Fay Records, MD;  Location: [place];  Service: Cardiovascular;  Laterality: N/A;  . CARDIOVERSION  04/25/2012   Procedure: CARDIOVERSION;  Surgeon: Thayer Headings, MD;  Location: [place];  Service: Cardiovascular;  Laterality: N/A;  . CARDIOVERSION  04/25/2012   Procedure: CARDIOVERSION;  Surgeon: Fay Records, MD;  Location: [place];  Service: Cardiovascular;  Laterality: N/A;  . CARDIOVERSION  05/09/2012   Procedure: CARDIOVERSION;  Surgeon: Sherren Mocha, MD;   Location: [place];  Service: Cardiovascular;  Laterality: N/A;  changed from crenshaw to cooper by trish/leone-endo  . CARDIOVERSION N/A 03/15/2015   Procedure: CARDIOVERSION;  Surgeon: Thayer Headings, MD;  Location: [facility] ENDOSCOPY;  Service: Cardiovascular;  Laterality: N/A;  . COLONOSCOPY    . COLONOSCOPY WITH PROPOFOL N/A 10/21/2014   Procedure: COLONOSCOPY WITH PROPOFOL;  Surgeon: Ladene Artist, MD;  Location: [facility] ENDOSCOPY;  Service: Endoscopy;  Laterality: N/A;  . EP IMPLANTABLE DEVICE N/A 04/06/2015   Procedure: Loop Recorder Insertion;  Surgeon: Evans Lance, MD;  Location: [place] CV LAB;  Service: Cardiovascular;  Laterality: N/A;  . ESOPHAGOGASTRODUODENOSCOPY    . JOINT REPLACEMENT    . LOOP RECORDER REMOVAL N/A 09/18/2017   Procedure: LOOP RECORDER REMOVAL;  Surgeon: Deboraha Sprang, MD;  Location: [place] CV LAB;  Service: Cardiovascular;  Laterality: N/A;  . RIGHT/LEFT HEART CATH AND CORONARY ANGIOGRAPHY N/A 01/28/2017   Procedure: Right/Left Heart Cath and Coronary Angiography;  Surgeon: Larey Dresser, MD;  Location: [place] CV LAB;  Service: Cardiovascular;  Laterality: N/A;  . TEE WITHOUT CARDIOVERSION  04/25/2012   Procedure: TRANSESOPHAGEAL ECHOCARDIOGRAM (TEE);  Surgeon: Thayer Headings, MD;  Location: [place];  Service: Cardiovascular;  Laterality: N/A;  . TEE WITHOUT CARDIOVERSION N/A 03/15/2015   Procedure: TRANSESOPHAGEAL ECHOCARDIOGRAM (TEE);  Surgeon: Thayer Headings, MD;  Location: [place];  Service: Cardiovascular;  Laterality: N/A;  . TONSILLECTOMY AND ADENOIDECTOMY    . TOTAL KNEE ARTHROPLASTY Right 06/15/2014   Procedure: TOTAL KNEE ARTHROPLASTY;  Surgeon: Renette Butters, MD;  Location: [place];  Service: Orthopedics;  Laterality: Right;    reports that he quit smoking about 12 years ago. His smoking use included cigarettes. He has a 60.00 pack-year smoking history. He has never used smokeless tobacco. He reports that he does not drink alcohol or use  drugs. family history includes Allergies in his mother; Asthma in his maternal grandmother and mother; COPD in his mother; Colon polyps in his mother; Hypothyroidism in his mother. Allergies  Allergen Reactions  . Amiodarone Other (See Comments)    hyperthyroidism  . Statins Other (See Comments)    myalgia  . Tape Other (See Comments)    Skin Tears Use Paper Tape Only   Current Outpatient Medications on File Prior to Visit  Medication Sig Dispense Refill  . acetaminophen (TYLENOL) 500 MG tablet Take 500 mg by mouth every 6 (six) hours as needed.    . ALPRAZolam (XANAX) 0.5 MG tablet TAKE 2 TABLETS BY MOUTH AT BEDTIME 60 tablet 2  . apixaban (ELIQUIS) 5 MG TABS tablet Take 1 tablet (5 mg total) by mouth 2 (two) times daily. 180 tablet 3  . carisoprodol (SOMA) 350 MG tablet TAKE 1 TABLET BY MOUTH THREE TIMES A DAY  AS NEEDED FOR MUSCLE SPASMS 90 tablet 5  . carvedilol (COREG) 12.5 MG tablet TAKE 1 TABLET (12.5 MG TOTAL) BY MOUTH 2 (TWO) TIMES DAILY WITH A MEAL. 180 tablet 3  . cephALEXin (KEFLEX) 500 MG capsule Take 1 capsule (500 mg total) by mouth 2 (two) times daily. 14 capsule 0  . clobetasol ointment (TEMOVATE) [id number] % Apply 1 application topically 2 (two) times daily. 60 g 1  . digoxin (LANOXIN) 0.25 MG tablet TAKE 1/2 TABLET BY MOUTH EVERY DAY 45 tablet 3  . doxepin (SINEQUAN) 10 MG capsule TAKE 2 CAPSULES BY MOUTH AT BEDTIME. ANNUAL APPT DUE IN JUNE MUST SEE PROVIDER FOR FUTURE REFILLS 180 capsule 1  . ezetimibe (ZETIA) 10 MG tablet Take 1 tablet (10 mg total) by mouth daily. 90 tablet 3  . fluticasone (FLONASE) 50 MCG/ACT nasal spray PLACE 2 SPRAYS INTO BOTH NOSTRILS DAILY AS NEEDED FOR ALLERGIES OR RHINITIS. 48 mL 1  . Fluticasone-Salmeterol (ADVAIR DISKUS) 250-50 MCG/DOSE AEPB Inhale 1 puff into the lungs 2 (two) times daily. 180 each 3  . hydrocortisone 2.5 % cream APPLY TO AFFECTED AREA TWICE A DAY 28.35 g 6  . hydrOXYzine (ATARAX/VISTARIL) 10 MG tablet TAKE 1 TABLET BY MOUTH THREE  TIMES A DAY AS NEEDED 60 tablet 2  . ketoconazole (NIZORAL) 2 % cream APPLY TO AFFECTED AREA EVERY DAY 15 g 0  . lidocaine (LIDODERM) 5 % APPLY 1 PATCH TO AFFECTED AREA FOR UP TO 12 HOURS . DO NOT USE MORE THAN ONE PATCH IN 24 HOURS. 90 patch 1  . methotrexate (RHEUMATREX) 2.5 MG tablet TAKE 4 TABLETS BY MOUTH EVERY WEEK 48 tablet 1  . omeprazole (PRILOSEC) 20 MG capsule Take 1 capsule (20 mg total) by mouth daily with supper. 90 capsule 1  . rosuvastatin (CRESTOR) 5 MG tablet Take 1 tablet (5 mg total) by mouth daily. 90 tablet 3  . sacubitril-valsartan (ENTRESTO) 24-26 MG Take 1 tablet by mouth 2 (two) times daily. 60 tablet 5  . sertraline (ZOLOFT) 100 MG tablet TAKE 2 TABLETS BY MOUTH AT BEDTIME 180 tablet 3  . silver sulfADIAZINE (SILVADENE) 1 % cream APPLY TOPICALLY TO AFFECTED AREA EVERY DAY 50 g 3  . spironolactone (ALDACTONE) 25 MG tablet TAKE 1 TABLET BY MOUTH EVERY DAY 90 tablet 1  . torsemide (DEMADEX) 20 MG tablet TAKE 2 TABLETS BY MOUTH TWICE A DAY 360 tablet 1  . traMADol (ULTRAM) 50 MG tablet Take 1 tablet (50 mg total) by mouth daily as needed. 30 tablet 5  . traZODone (DESYREL) 50 MG tablet TAKE 1 TABLET (50 MG TOTAL) BY MOUTH AT BEDTIME AS NEEDED FOR SLEEP. 90 tablet 1  . triamcinolone (KENALOG) 0.1 % paste APPLY 1 APPLICATION IN THE MOUTH OR THROAT 2 (TWO) TIMES DAILY AS DIRECTED 5 g 2  . TUMS 500 MG chewable tablet Chew 500-2,000 mg by mouth every 4 (four) hours as needed for indigestion or heartburn.     . valACYclovir (VALTREX) 1000 MG tablet TAKE 1 TABLET BY MOUTH THREE TIMES A DAY 21 tablet 2  . venlafaxine XR (EFFEXOR-XR) 37.5 MG 24 hr capsule TAKE ONE CAPSULE BY MOUTH EVERY DAY WITH BREAKFAST 90 capsule 1  . zolpidem (AMBIEN) 10 MG tablet TAKE 1 TABLET BY MOUTH EVERYDAY AT BEDTIME 90 tablet 1   No current facility-administered medications on file prior to visit.    Observations/Objective: Alert, NAD, appropriate mood and affect, resps normal, cn 2-12 intact, moves  all 4s, no visible rash or swelling  Lab Results  Component Value Date   WBC 6.9 11/06/2019   HGB 14.7 11/06/2019   HCT 42.8 11/06/2019   PLT 163 11/06/2019   GLUCOSE 103 (H) 11/16/2019   CHOL 206 (H) 11/06/2019   TRIG 60 11/06/2019   HDL 58 11/06/2019   LDLDIRECT 121 (H) 12/19/2017   LDLCALC 136 (H) 11/06/2019   ALT 32 11/16/2019   AST 23 11/16/2019   NA 140 11/16/2019   K 4.2 11/16/2019   CL 101 11/16/2019   CREATININE 1.06 11/16/2019   BUN 21 11/16/2019   CO2 29 11/16/2019   TSH 2.48 03/23/2019   PSA 2.89 09/23/2019   INR 1.0 (A) 12/23/2018   HGBA1C 5.2 09/23/2019   MICROALBUR <0.7 03/23/2019   Assessment and Plan: See notes  Follow Up Instructions: See notes   I discussed the assessment and treatment plan with the patient. The patient was provided an opportunity to ask questions and all were answered. The patient agreed with the plan and demonstrated an understanding of the instructions.   The patient was advised to call back or seek an in-person evaluation if the symptoms worsen or if the condition fails to improve as anticipated.   [name], MD

## 2019-11-28 ENCOUNTER — Encounter (HOSPITAL_BASED_OUTPATIENT_CLINIC_OR_DEPARTMENT_OTHER): Payer: Medicare HMO | Admitting: Cardiology

## 2019-11-30 ENCOUNTER — Telehealth: Payer: Self-pay | Admitting: Family Medicine

## 2019-11-30 NOTE — Telephone Encounter (Signed)
Patient was scheduled to see Dr Tamala Julian on Wednesday. He just called and said that he tested positive for COVID on Friday. I went ahead and canceled the Wednesday apt.  How long does he need to wait to come in?

## 2019-12-01 NOTE — Telephone Encounter (Signed)
Appointment made

## 2019-12-01 NOTE — Telephone Encounter (Signed)
Left message for patient to reschedule to the week of March 8th.

## 2019-12-02 ENCOUNTER — Ambulatory Visit: Payer: Medicare HMO | Admitting: Family Medicine

## 2019-12-02 ENCOUNTER — Other Ambulatory Visit: Payer: Self-pay | Admitting: Internal Medicine

## 2019-12-07 ENCOUNTER — Ambulatory Visit (INDEPENDENT_AMBULATORY_CARE_PROVIDER_SITE_OTHER): Payer: Medicare HMO | Admitting: Family Medicine

## 2019-12-07 ENCOUNTER — Ambulatory Visit (INDEPENDENT_AMBULATORY_CARE_PROVIDER_SITE_OTHER): Payer: Medicare HMO

## 2019-12-07 VITALS — BP 102/60 | HR 72 | Temp 98.6°F | Wt 237.0 lb

## 2019-12-07 DIAGNOSIS — R0602 Shortness of breath: Secondary | ICD-10-CM

## 2019-12-07 DIAGNOSIS — J40 Bronchitis, not specified as acute or chronic: Secondary | ICD-10-CM | POA: Diagnosis not present

## 2019-12-07 DIAGNOSIS — U071 COVID-19: Secondary | ICD-10-CM | POA: Diagnosis not present

## 2019-12-07 MED ORDER — BENZONATATE 100 MG PO CAPS: 100.0000 mg | ORAL_CAPSULE | Freq: Three times a day (TID) | ORAL | 0 refills | Status: DC | PRN

## 2019-12-07 MED ORDER — METHYLPREDNISOLONE ACETATE 40 MG/ML IJ SUSP
40.0000 mg | Freq: Once | INTRAMUSCULAR | Status: AC
  Administered 2019-12-07: 21:00:00 40 mg via INTRAMUSCULAR

## 2019-12-07 MED ORDER — PREDNISONE 50 MG PO TABS: 50.0000 mg | ORAL_TABLET | Freq: Every day | ORAL | 0 refills | Status: DC

## 2019-12-07 NOTE — Patient Instructions (Signed)
Acute Bronchitis, Adult  Acute bronchitis is when air tubes in the lungs (bronchi) suddenly get swollen. The condition can make it hard for you to breathe. In adults, acute bronchitis usually goes away within 2 weeks. A cough caused by bronchitis may last up to 3 weeks. Smoking, allergies, and asthma can make the condition worse. What are the causes? This condition is caused by:  Cold and flu viruses. The most common cause of this condition is the virus that causes the common cold.  Bacteria.  Substances that irritate the lungs, including: ? Smoke from cigarettes and other types of tobacco. ? Dust and pollen. ? Fumes from chemicals, gases, or burned fuel. ? Other materials that pollute indoor or outdoor air.  Close contact with someone who has acute bronchitis. What increases the risk? The following factors may make you more likely to develop this condition:  A weak body's defense system. This is also called the immune system.  Any condition that affects your lungs and breathing, such as asthma. What are the signs or symptoms? Symptoms of this condition include:  A cough.  Coughing up clear, yellow, or green mucus.  Wheezing.  Chest congestion.  Shortness of breath.  A fever.  Body aches.  Chills.  A sore throat. How is this treated? Acute bronchitis may go away over time without treatment. Your doctor may recommend:  Drinking more fluids.  Taking a medicine for a fever or cough.  Using a device that gets medicine into your lungs (inhaler).  Using a vaporizer or a humidifier. These are machines that add water or moisture in the air to help with coughing and poor breathing. Follow these instructions at home:  Activity  Get a lot of rest.  Avoid places where there are fumes from chemicals.  Return to your normal activities as told by your doctor. Ask your doctor what activities are safe for you. Lifestyle  Drink enough fluids to keep your pee (urine) pale  yellow.  Do not drink alcohol.  Do not use any products that contain nicotine or tobacco, such as cigarettes, e-cigarettes, and chewing tobacco. If you need help quitting, ask your doctor. Be aware that: ? Your bronchitis will get worse if you smoke or breathe in other people's smoke (secondhand smoke). ? Your lungs will heal faster if you quit smoking. General instructions  Take over-the-counter and prescription medicines only as told by your doctor.  Use an inhaler, cool mist vaporizer, or humidifier as told by your doctor.  Rinse your mouth often with salt water. To make salt water, dissolve -1 tsp (3-6 g) of salt in 1 cup (237 mL) of warm water.  Keep all follow-up visits as told by your doctor. This is important. How is this prevented? To lower your risk of getting this condition again:  Wash your hands often with soap and water. If soap and water are not available, use hand sanitizer.  Avoid contact with people who have cold symptoms.  Try not to touch your mouth, nose, or eyes with your hands.  Make sure to get the flu shot every year. Contact a doctor if:  Your symptoms do not get better in 2 weeks.  You vomit more than once or twice.  You have symptoms of loss of fluid from your body (dehydration). These include: ? Dark urine. ? Dry skin or eyes. ? Increased thirst. ? Headaches. ? Confusion. ? Muscle cramps. Get help right away if:  You cough up blood.  You have chest   pain.  You have very bad shortness of breath.  You become dehydrated.  You faint or keep feeling like you are going to faint.  You keep vomiting.  You have a very bad headache.  Your fever or chills get worse. These symptoms may be an emergency. Do not wait to see if the symptoms will go away. Get medical help right away. Call your local emergency services (911 in the [place] Do not drive yourself to the hospital. Summary  Acute bronchitis is when air tubes in the lungs (bronchi)  suddenly get swollen. In adults, acute bronchitis usually goes away within 2 weeks.  Take over-the-counter and prescription medicines only as told by your doctor.  Drink enough fluid to keep your pee (urine) pale yellow.  Contact a doctor if your symptoms do not improve after 2 weeks of treatment.  Get help right away if you cough up blood, faint, or have chest pain or shortness of breath. This information is not intended to replace advice given to you by your health care provider. Make sure you discuss any questions you have with your health care provider. Document Revised: 04/24/2019 Document Reviewed: 04/24/2019 Elsevier Patient Education  2020 [employer]  

## 2019-12-07 NOTE — Progress Notes (Signed)
Patient ID: [name], male    DOB: November 07, 1955, 64 y.o.   MRN: [id number]  PCP: [name], MD  Chief Complaint  Patient presents with  . COVID 19 Infection    body aches and chills   . Shortness of Breath    wheezing     Subjective:  HPI  [name] is a 64 y.o. male presents to [facility] Res[facility] clinic for evaluation of symptoms related to  Recently diagnosed COVID-19 infection.  Patient was diagnosed with COVID-19 at [facility].  Initially, his symptoms were rather mild but over the course of the last several days he is experienced body aches, chills, gradually worsening shortness of breath, cough which is dry nonproductive with some chest tightness with breathing.  He had a visit with his PCP and was referred here to the [facility] respiratory clinic for evaluation of a possible pneumonia.  Uncertain if he has had fever.  He is unable to check his oxygen level at home.  High risk for complications related to COVID-19 infection due to type 2 diabetes, COPD, morbid obesity, hypercoagulability, and hyperlipidemia.  Review of Systems Pertinent negatives listed in HPI  Patient Active Problem List   Diagnosis Date Noted  . Upper respiratory infection 11/27/2019  . COPD exacerbation ([place]) 11/27/2019  . Elevated PSA 09/23/2019  . Left rotator cuff tear arthropathy 08/06/2019  . Lip lesion 12/23/2018  . Diabetic ulcer of left lower leg with necrosis of muscle ([place]) 12/05/2018  . Chronic bursitis of left shoulder 07/01/2018  . Pain due to total knee replacement ([place]) 03/31/2018  . Rotator cuff arthropathy, left 02/13/2018  . Right rotator cuff tear arthropathy 01/14/2018  . Increased prostate specific antigen (PSA) velocity 09/26/2017  . Trigger point of thoracic region 07/04/2017  . Right lumbar radiculopathy 05/15/2017  . Right leg swelling 05/09/2017  . Glaucoma 04/18/2017  . History of nasal polyposis 02/19/2017  . Chronic rhinitis 02/19/2017  . Microhematuria 12/04/2016   . Preventative health care 11/29/2015  . Type II diabetes mellitus ([place])   . Hyperlipidemia   . Persistent atrial fibrillation ([place]) 03/30/2015  . Syncope 03/30/2015  . Anemia 02/23/2015  . Thyrotoxicosis 02/23/2015  . Hx of adenomatous colonic polyps 10/21/2014  . De Quervain's tenosynovitis, right 04/30/2014  . Osteoarthritis of right knee 02/19/2014  . Encounter for therapeutic drug monitoring 11/17/2013  . Thoracic degenerative disc disease 06/17/2013  . Thoracic spondylosis without myelopathy 02/11/2013  . Lumbosacral spondylosis without myelopathy 02/11/2013  . Degenerative joint disease of knee, left 02/11/2013  . COPD (chronic obstructive pulmonary disease) ([place]) 08/20/2012  . Bundle branch block, right and extreme left anterior fascicular 05/05/2012  . Nonischemic cardiomyopathy ([place]) 05/02/2012  . Anticoagulated on warfarin 03/21/2012  . Spongiotic dermatitis 09/26/2011  . Past myocardial infarction 04/19/2011  . UNSPECIFIED URETHRAL STRICTURE 12/26/2010  . Chronic pain syndrome 05/18/2010  . Obstructive sleep apnea 10/21/2007  . NEPHROLITHIASIS, HX OF 10/21/2007  . Anxiety 06/09/2007  . GERD 06/09/2007  . BENIGN PROSTATIC HYPERTROPHY 06/09/2007  . Morbid obesity ([place]) 06/03/2007  . Depression 06/03/2007  . Essential hypertension 06/03/2007  . INSOMNIA 06/03/2007      Prior to Admission medications   Medication Sig Start Date End Date Taking? Authorizing Provider  acetaminophen (TYLENOL) 500 MG tablet Take 500 mg by mouth every 6 (six) hours as needed.   Yes [provider]  albuterol (VENTOLIN HFA) 108 (90 Base) MCG/ACT inhaler INHALE 2 PUFFS 4 TIMES A DAY as needed 11/27/19  Yes [name], MD  ALPRAZolam Duanne Moron) 0.5 MG tablet TAKE 2 TABLETS BY MOUTH AT BEDTIME 11/11/19  Yes [name], MD  apixaban (ELIQUIS) 5 MG TABS tablet Take 1 tablet (5 mg total) by mouth 2 (two) times daily. 11/11/19  Yes Larey Dresser, MD  carisoprodol (SOMA) 350 MG  tablet TAKE 1 TABLET BY MOUTH THREE TIMES A DAY AS NEEDED FOR MUSCLE SPASMS 09/04/19  Yes [name], MD  carvedilol (COREG) 12.5 MG tablet TAKE 1 TABLET (12.5 MG TOTAL) BY MOUTH 2 (TWO) TIMES DAILY WITH A MEAL. 07/07/18  Yes Larey Dresser, MD  cephALEXin (KEFLEX) 500 MG capsule Take 1 capsule (500 mg total) by mouth 2 (two) times daily. 12/24/18  Yes Hulan Saas M, DO  clobetasol ointment (TEMOVATE) [id number] % Apply 1 application topically 2 (two) times daily. 09/17/17  Yes Janith Lima, MD  digoxin (LANOXIN) 0.25 MG tablet TAKE 1/2 TABLET BY MOUTH EVERY DAY 11/25/19  Yes Larey Dresser, MD  doxepin (SINEQUAN) 10 MG capsule TAKE 2 CAPSULES BY MOUTH AT BEDTIME. ANNUAL APPT DUE IN [place] MUST SEE PROVIDER FOR FUTURE REFILLS 10/05/19  Yes [name], MD  ezetimibe (ZETIA) 10 MG tablet Take 1 tablet (10 mg total) by mouth daily. 09/26/17  Yes [name], MD  fluticasone (FLONASE) 50 MCG/ACT nasal spray PLACE 2 SPRAYS INTO BOTH NOSTRILS DAILY AS NEEDED FOR ALLERGIES OR RHINITIS. 10/15/19  Yes [name], MD  Fluticasone-Salmeterol (ADVAIR DISKUS) 250-50 MCG/DOSE AEPB Inhale 1 puff into the lungs 2 (two) times daily. 05/08/17  Yes [name], MD  HYDROcodone-homatropine [facility]) 5-1.5 MG/5ML syrup Take 5 mLs by mouth every 6 (six) hours as needed for up to 10 days for cough. 11/27/19 12/07/19 Yes [name], MD  hydrocortisone 2.5 % cream APPLY TO AFFECTED AREA TWICE A DAY 07/27/19  Yes [name], MD  hydrOXYzine (ATARAX/VISTARIL) 10 MG tablet TAKE 1 TABLET BY MOUTH THREE TIMES A DAY AS NEEDED 04/27/19  Yes [name], MD  ketoconazole (NIZORAL) 2 % cream APPLY TO AFFECTED AREA EVERY DAY 07/22/18  Yes [name], MD  levofloxacin (LEVAQUIN) 500 MG tablet Take 1 tablet (500 mg total) by mouth daily for 10 days. 11/27/19 12/07/19 Yes [name], MD  lidocaine (LIDODERM) 5 % APPLY 1 PATCH TO AFFECTED AREA FOR UP TO 12 HOURS . DO NOT USE MORE THAN ONE PATCH IN 24 HOURS. 07/13/19  Yes  [name], MD  methotrexate (RHEUMATREX) 2.5 MG tablet TAKE 4 TABLETS BY MOUTH EVERY WEEK 11/10/18  Yes [name], MD  omeprazole (PRILOSEC) 20 MG capsule Take 1 capsule (20 mg total) by mouth daily with supper. 08/27/16  Yes [name], MD  predniSONE (DELTASONE) 10 MG tablet 3 tabs by mouth per day for 3 days,2tabs per day for 3 days,1tab per day for 3 days 11/27/19  Yes [name], MD  rosuvastatin (CRESTOR) 5 MG tablet Take 1 tablet (5 mg total) by mouth daily. 09/24/19 12/23/19 Yes Larey Dresser, MD  sacubitril-valsartan (ENTRESTO) 24-26 MG Take 1 tablet by mouth 2 (two) times daily. 11/06/19  Yes Larey Dresser, MD  sertraline (ZOLOFT) 100 MG tablet TAKE 2 TABLETS BY MOUTH AT BEDTIME 03/26/17  Yes [name], MD  silver sulfADIAZINE (SILVADENE) 1 % cream APPLY TOPICALLY TO AFFECTED AREA EVERY DAY 03/11/19  Yes [name], MD  spironolactone (ALDACTONE) 25 MG tablet TAKE 1 TABLET BY  MOUTH EVERY DAY 11/02/19  Yes Larey Dresser, MD  torsemide (DEMADEX) 20 MG tablet TAKE 2 TABLETS BY MOUTH TWICE A DAY 07/20/19  Yes Larey Dresser, MD  traMADol (ULTRAM) 50 MG tablet Take 1 tablet (50 mg total) by mouth daily as needed. 10/24/19  Yes [name], MD  traZODone (DESYREL) 50 MG tablet TAKE 1 TABLET (50 MG TOTAL) BY MOUTH AT BEDTIME AS NEEDED FOR SLEEP. 09/14/19  Yes [name], MD  triamcinolone (KENALOG) 0.1 % paste APPLY 1 APPLICATION IN THE MOUTH OR THROAT 2 (TWO) TIMES DAILY AS DIRECTED 11/16/19  Yes [name], MD  TUMS 500 MG chewable tablet Chew 500-2,000 mg by mouth every 4 (four) hours as needed for indigestion or heartburn.  10/19/16  Yes [provider]  valACYclovir (VALTREX) 1000 MG tablet TAKE 1 TABLET BY MOUTH THREE TIMES A DAY 12/02/19  Yes [name], MD  venlafaxine XR (EFFEXOR-XR) 37.5 MG 24 hr capsule TAKE ONE CAPSULE BY MOUTH EVERY DAY WITH BREAKFAST 11/25/19  Yes Hulan Saas M, DO  zolpidem (AMBIEN) 10 MG tablet TAKE 1 TABLET BY MOUTH EVERYDAY  AT BEDTIME 09/21/19  Yes [name], MD    Past Medical, Surgical Family and Social History reviewed and updated.    Objective:   Today's Vitals   12/07/19 1844  BP: 102/60  Pulse: 72  Temp: 98.6 F (37 C)  SpO2: 96%  Weight: 237 lb (107.5 kg)    Wt Readings from Last 3 Encounters:  12/07/19 237 lb (107.5 kg)  11/06/19 235 lb 9.6 oz (106.9 kg)  09/23/19 227 lb (103 kg)     Physical Exam Constitutional:      Appearance: He is obese. He is ill-appearing.  HENT:     Head: Normocephalic.     Mouth/Throat:     Mouth: Mucous membranes are moist.  Eyes:     Extraocular Movements: Extraocular movements intact.  Cardiovascular:     Rate and Rhythm: Normal rate and regular rhythm.  Pulmonary:     Effort: No respiratory distress.     Breath sounds: Wheezing and rhonchi present.     Comments: Increased work of breathing with exertional activity. Musculoskeletal:        General: Normal range of motion.  Skin:    Coloration: Skin is cyanotic.  Neurological:     Mental Status: He is oriented to person, place, and time.  Psychiatric:        Mood and Affect: Mood normal.        Behavior: Behavior normal.      Assessment & Plan:  1. SOB (shortness of breath), likely secondary to COVID-19 and chronic COPD Chest x-ray was negative for pneumonia. Prednisone 50 mg x 5 days to decrease lung inflammation methylPREDNISolone acetate (DEPO-MEDROL) injection 40 mg IM given here in clinic today as patient is unable to pick up his oral prednisone until tomorrow.  2. COVID-19 virus infection, diagnosed 11/27/2019 Patient is high risk for complication given multiple comorbid conditions crease his risk for complicated course of [id number] infection.  Red flags discussed and definitely with patient that would warrant him to follow-up at ER.  3. Bronchitis -Patient recently completed a course of levofloxacin prescribed by PCP on 11/27/2019 therefore will not initiate another course of  antibiotics, in light of negative chest x-ray. -Treating for chronic bronchitis symptoms associated with COPD -Prednisone 50 mg once daily x5 days - methylPREDNISolone acetate (DEPO-MEDROL) injection 40 mg, IM given in clinic  today  Meds ordered this encounter  Medications  . predniSONE (DELTASONE) 50 MG tablet    Sig: Take 1 tablet (50 mg total) by mouth daily with breakfast.    Dispense:  5 tablet    Refill:  0  . benzonatate (TESSALON) 100 MG capsule    Sig: Take 1-2 capsules (100-200 mg total) by mouth 3 (three) times daily as needed for cough.    Dispense:  60 capsule    Refill:  0  . methylPREDNISolone acetate (DEPO-MEDROL) injection 40 mg     Our clinic will follow up with you in 2 days to evaluate your symptoms if no improvement will see you back here.  -The patient was given clear instructions to go to ER or return to medical center if symptoms do not improve, worsen or new problems develop. The patient verbalized understanding.     Molli Barrows, FNP-C [facility], PRN Provider  [facility]. [place], [place] [facility] Phone: [phone] Clinic Fax: [phone] Clinic Hours: 5:30 pm -7:30 pm (Monday-Friday)

## 2019-12-09 ENCOUNTER — Telehealth: Payer: Self-pay | Admitting: Medical

## 2019-12-09 ENCOUNTER — Telehealth: Payer: Self-pay

## 2019-12-09 ENCOUNTER — Other Ambulatory Visit: Payer: Self-pay

## 2019-12-09 ENCOUNTER — Encounter (HOSPITAL_COMMUNITY): Payer: Self-pay

## 2019-12-09 ENCOUNTER — Emergency Department (HOSPITAL_COMMUNITY): Payer: Medicare HMO

## 2019-12-09 ENCOUNTER — Inpatient Hospital Stay (HOSPITAL_COMMUNITY)
Admission: EM | Admit: 2019-12-09 | Discharge: 2019-12-11 | DRG: 177 | Disposition: A | Discharge status: Home Health | Payer: Medicare HMO | Attending: Internal Medicine | Admitting: Internal Medicine

## 2019-12-09 ENCOUNTER — Inpatient Hospital Stay (HOSPITAL_COMMUNITY): Payer: Medicare HMO

## 2019-12-09 ENCOUNTER — Ambulatory Visit: Payer: Medicare HMO

## 2019-12-09 DIAGNOSIS — Z7901 Long term (current) use of anticoagulants: Secondary | ICD-10-CM

## 2019-12-09 DIAGNOSIS — I252 Old myocardial infarction: Secondary | ICD-10-CM | POA: Diagnosis not present

## 2019-12-09 DIAGNOSIS — I482 Chronic atrial fibrillation, unspecified: Secondary | ICD-10-CM | POA: Diagnosis present

## 2019-12-09 DIAGNOSIS — J439 Emphysema, unspecified: Secondary | ICD-10-CM | POA: Diagnosis present

## 2019-12-09 DIAGNOSIS — I517 Cardiomegaly: Secondary | ICD-10-CM | POA: Diagnosis not present

## 2019-12-09 DIAGNOSIS — M797 Fibromyalgia: Secondary | ICD-10-CM | POA: Diagnosis present

## 2019-12-09 DIAGNOSIS — F329 Major depressive disorder, single episode, unspecified: Secondary | ICD-10-CM | POA: Diagnosis not present

## 2019-12-09 DIAGNOSIS — U071 COVID-19: Principal | ICD-10-CM | POA: Diagnosis present

## 2019-12-09 DIAGNOSIS — E1142 Type 2 diabetes mellitus with diabetic polyneuropathy: Secondary | ICD-10-CM | POA: Diagnosis present

## 2019-12-09 DIAGNOSIS — D6959 Other secondary thrombocytopenia: Secondary | ICD-10-CM | POA: Diagnosis present

## 2019-12-09 DIAGNOSIS — M069 Rheumatoid arthritis, unspecified: Secondary | ICD-10-CM | POA: Diagnosis present

## 2019-12-09 DIAGNOSIS — R0602 Shortness of breath: Secondary | ICD-10-CM | POA: Diagnosis not present

## 2019-12-09 DIAGNOSIS — Z7951 Long term (current) use of inhaled steroids: Secondary | ICD-10-CM

## 2019-12-09 DIAGNOSIS — A419 Sepsis, unspecified organism: Secondary | ICD-10-CM | POA: Diagnosis not present

## 2019-12-09 DIAGNOSIS — I428 Other cardiomyopathies: Secondary | ICD-10-CM | POA: Diagnosis not present

## 2019-12-09 DIAGNOSIS — I5022 Chronic systolic (congestive) heart failure: Secondary | ICD-10-CM | POA: Diagnosis not present

## 2019-12-09 DIAGNOSIS — E861 Hypovolemia: Secondary | ICD-10-CM

## 2019-12-09 DIAGNOSIS — I9589 Other hypotension: Secondary | ICD-10-CM | POA: Diagnosis not present

## 2019-12-09 DIAGNOSIS — K219 Gastro-esophageal reflux disease without esophagitis: Secondary | ICD-10-CM | POA: Diagnosis present

## 2019-12-09 DIAGNOSIS — Z6834 Body mass index (BMI) 34.0-34.9, adult: Secondary | ICD-10-CM | POA: Diagnosis not present

## 2019-12-09 DIAGNOSIS — Z86010 Personal history of colonic polyps: Secondary | ICD-10-CM

## 2019-12-09 DIAGNOSIS — R69 Illness, unspecified: Secondary | ICD-10-CM

## 2019-12-09 DIAGNOSIS — Z96651 Presence of right artificial knee joint: Secondary | ICD-10-CM | POA: Diagnosis present

## 2019-12-09 DIAGNOSIS — G4733 Obstructive sleep apnea (adult) (pediatric): Secondary | ICD-10-CM | POA: Diagnosis present

## 2019-12-09 DIAGNOSIS — Z8616 Personal history of COVID-19: Secondary | ICD-10-CM

## 2019-12-09 DIAGNOSIS — Z95810 Presence of automatic (implantable) cardiac defibrillator: Secondary | ICD-10-CM

## 2019-12-09 DIAGNOSIS — I480 Paroxysmal atrial fibrillation: Secondary | ICD-10-CM | POA: Diagnosis present

## 2019-12-09 DIAGNOSIS — Z825 Family history of asthma and other chronic lower respiratory diseases: Secondary | ICD-10-CM

## 2019-12-09 DIAGNOSIS — Z9981 Dependence on supplemental oxygen: Secondary | ICD-10-CM

## 2019-12-09 DIAGNOSIS — I959 Hypotension, unspecified: Secondary | ICD-10-CM | POA: Diagnosis present

## 2019-12-09 DIAGNOSIS — Z79891 Long term (current) use of opiate analgesic: Secondary | ICD-10-CM

## 2019-12-09 DIAGNOSIS — I110 Hypertensive heart disease with heart failure: Secondary | ICD-10-CM | POA: Diagnosis present

## 2019-12-09 DIAGNOSIS — F419 Anxiety disorder, unspecified: Secondary | ICD-10-CM | POA: Diagnosis present

## 2019-12-09 DIAGNOSIS — J1282 Pneumonia due to coronavirus disease 2019: Secondary | ICD-10-CM | POA: Diagnosis not present

## 2019-12-09 DIAGNOSIS — R7612 Nonspecific reaction to cell mediated immunity measurement of gamma interferon antigen response without active tuberculosis: Secondary | ICD-10-CM | POA: Diagnosis not present

## 2019-12-09 DIAGNOSIS — E785 Hyperlipidemia, unspecified: Secondary | ICD-10-CM | POA: Diagnosis present

## 2019-12-09 DIAGNOSIS — Z79899 Other long term (current) drug therapy: Secondary | ICD-10-CM

## 2019-12-09 DIAGNOSIS — Z7952 Long term (current) use of systemic steroids: Secondary | ICD-10-CM

## 2019-12-09 DIAGNOSIS — Z87891 Personal history of nicotine dependence: Secondary | ICD-10-CM

## 2019-12-09 DIAGNOSIS — I10 Essential (primary) hypertension: Secondary | ICD-10-CM | POA: Diagnosis not present

## 2019-12-09 DIAGNOSIS — H409 Unspecified glaucoma: Secondary | ICD-10-CM | POA: Diagnosis present

## 2019-12-09 DIAGNOSIS — Z8371 Family history of colonic polyps: Secondary | ICD-10-CM

## 2019-12-09 HISTORY — DX: COVID-19: U07.1

## 2019-12-09 LAB — PROTIME-INR
INR: 1.1 (ref 0.8–1.2)
Prothrombin Time: 13.7 seconds (ref 11.4–15.2)

## 2019-12-09 LAB — COMPREHENSIVE METABOLIC PANEL
ALT: 30 U/L (ref 0–44)
AST: 26 U/L (ref 15–41)
Albumin: 2.8 g/dL — ABNORMAL LOW (ref 3.5–5.0)
Alkaline Phosphatase: 40 U/L (ref 38–126)
Anion gap: 9 (ref 5–15)
BUN: 27 mg/dL — ABNORMAL HIGH (ref 8–23)
CO2: 25 mmol/L (ref 22–32)
Calcium: 8.5 mg/dL — ABNORMAL LOW (ref 8.9–10.3)
Chloride: 99 mmol/L (ref 98–111)
Creatinine, Ser: 1.24 mg/dL (ref 0.61–1.24)
GFR calc Af Amer: >60 mL/min (ref >60)
GFR calc non Af Amer: >60 mL/min (ref >60)
Glucose, Bld: 110 mg/dL — ABNORMAL HIGH (ref 70–99)
Potassium: 3.5 mmol/L (ref 3.5–5.1)
Sodium: 133 mmol/L — ABNORMAL LOW (ref 135–145)
Total Bilirubin: 0.6 mg/dL (ref 0.3–1.2)
Total Protein: 5.3 g/dL — ABNORMAL LOW (ref 6.5–8.1)

## 2019-12-09 LAB — CBC WITH DIFFERENTIAL/PLATELET
Abs Immature Granulocytes: 0.03 10*3/uL (ref 0.00–0.07)
Basophils Absolute: 0 10*3/uL (ref 0.0–0.1)
Basophils Relative: 0 %
Eosinophils Absolute: 0 10*3/uL (ref 0.0–0.5)
Eosinophils Relative: 0 %
HCT: 36.1 % — ABNORMAL LOW (ref 39.0–52.0)
Hemoglobin: 12.6 g/dL — ABNORMAL LOW (ref 13.0–17.0)
Immature Granulocytes: 1 %
Lymphocytes Relative: 6 %
Lymphs Abs: 0.4 10*3/uL — ABNORMAL LOW (ref 0.7–4.0)
MCH: 35.1 pg — ABNORMAL HIGH (ref 26.0–34.0)
MCHC: 34.9 g/dL (ref 30.0–36.0)
MCV: 100.6 fL — ABNORMAL HIGH (ref 80.0–100.0)
Monocytes Absolute: 0.5 10*3/uL (ref 0.1–1.0)
Monocytes Relative: 7 %
Neutro Abs: 5.6 10*3/uL (ref 1.7–7.7)
Neutrophils Relative %: 86 %
Platelets: 104 10*3/uL — ABNORMAL LOW (ref 150–400)
RBC: 3.59 MIL/uL — ABNORMAL LOW (ref 4.22–5.81)
RDW: 12.9 % (ref 11.5–15.5)
WBC: 6.6 10*3/uL (ref 4.0–10.5)
nRBC: 0 % (ref 0.0–0.2)

## 2019-12-09 LAB — URINALYSIS, ROUTINE W REFLEX MICROSCOPIC
Bilirubin Urine: NEGATIVE
Glucose, UA: NEGATIVE mg/dL
Hgb urine dipstick: NEGATIVE
Ketones, ur: NEGATIVE mg/dL
Leukocytes,Ua: NEGATIVE
Nitrite: NEGATIVE
Protein, ur: NEGATIVE mg/dL
Specific Gravity, Urine: 1.006 (ref 1.005–1.030)
pH: 6 (ref 5.0–8.0)

## 2019-12-09 LAB — LACTIC ACID, PLASMA
Lactic Acid, Venous: 1.2 mmol/L (ref 0.5–1.9)
Lactic Acid, Venous: 1 mmol/L (ref 0.5–1.9)

## 2019-12-09 LAB — GLUCOSE, CAPILLARY
Glucose-Capillary: 131 mg/dL — ABNORMAL HIGH (ref 70–99)
Glucose-Capillary: 217 mg/dL — ABNORMAL HIGH (ref 70–99)

## 2019-12-09 LAB — DIGOXIN LEVEL: Digoxin Level: 0.4 ng/mL — ABNORMAL LOW (ref 0.8–2.0)

## 2019-12-09 LAB — TROPONIN I (HIGH SENSITIVITY)
Troponin I (High Sensitivity): 26 ng/L — ABNORMAL HIGH (ref <18)
Troponin I (High Sensitivity): 25 ng/L — ABNORMAL HIGH (ref <18)

## 2019-12-09 LAB — FIBRINOGEN: Fibrinogen: 516 mg/dL — ABNORMAL HIGH (ref 210–475)

## 2019-12-09 LAB — POC SARS CORONAVIRUS 2 AG -  ED: SARS Coronavirus 2 Ag: NEGATIVE

## 2019-12-09 LAB — C-REACTIVE PROTEIN: CRP: 9.4 mg/dL — ABNORMAL HIGH (ref <1.0)

## 2019-12-09 LAB — APTT: aPTT: 27 seconds (ref 24–36)

## 2019-12-09 LAB — TRIGLYCERIDES: Triglycerides: 56 mg/dL (ref <150)

## 2019-12-09 LAB — BRAIN NATRIURETIC PEPTIDE: B Natriuretic Peptide: 129.6 pg/mL — ABNORMAL HIGH (ref 0.0–100.0)

## 2019-12-09 LAB — LACTATE DEHYDROGENASE: LDH: 171 U/L (ref 98–192)

## 2019-12-09 LAB — RESPIRATORY PANEL BY RT PCR (FLU A&B, COVID)
Influenza A by PCR: NEGATIVE
Influenza B by PCR: NEGATIVE
SARS Coronavirus 2 by RT PCR: POSITIVE — AB

## 2019-12-09 LAB — PROCALCITONIN: Procalcitonin: <0.1 ng/mL

## 2019-12-09 LAB — FERRITIN: Ferritin: 262 ng/mL (ref 24–336)

## 2019-12-09 LAB — HIV ANTIBODY (ROUTINE TESTING W REFLEX): HIV Screen 4th Generation wRfx: NONREACTIVE

## 2019-12-09 LAB — TSH: TSH: 1.853 u[IU]/mL (ref 0.350–4.500)

## 2019-12-09 LAB — D-DIMER, QUANTITATIVE: D-Dimer, Quant: 12.38 ug/mL-FEU — ABNORMAL HIGH (ref 0.00–0.50)

## 2019-12-09 MED ORDER — BENZONATATE 100 MG PO CAPS
100.0000 mg | ORAL_CAPSULE | Freq: Three times a day (TID) | ORAL | Status: DC | PRN
  Administered 2019-12-10: 200 mg via ORAL
  Administered 2019-12-10 – 2019-12-11 (×2): 100 mg via ORAL
  Filled 2019-12-09 (×3): qty 1

## 2019-12-09 MED ORDER — FLUTICASONE PROPIONATE 50 MCG/ACT NA SUSP
2.0000 | Freq: Every day | NASAL | Status: DC | PRN
  Filled 2019-12-09: qty 16

## 2019-12-09 MED ORDER — IOHEXOL 350 MG/ML SOLN
75.0000 mL | Freq: Once | INTRAVENOUS | Status: AC | PRN
  Administered 2019-12-09: 75 mL via INTRAVENOUS

## 2019-12-09 MED ORDER — SODIUM CHLORIDE 0.9 % IV SOLN
2.0000 g | Freq: Three times a day (TID) | INTRAVENOUS | Status: DC
  Administered 2019-12-09 – 2019-12-10 (×2): 2 g via INTRAVENOUS
  Filled 2019-12-09 (×2): qty 2

## 2019-12-09 MED ORDER — VENLAFAXINE HCL ER 37.5 MG PO CP24
37.5000 mg | ORAL_CAPSULE | Freq: Every day | ORAL | Status: DC
  Administered 2019-12-10 – 2019-12-11 (×2): 37.5 mg via ORAL
  Filled 2019-12-09 (×2): qty 1

## 2019-12-09 MED ORDER — INSULIN ASPART 100 UNIT/ML ~~LOC~~ SOLN
0.0000 [IU] | Freq: Three times a day (TID) | SUBCUTANEOUS | Status: DC
  Administered 2019-12-09 – 2019-12-10 (×2): 1 [IU] via SUBCUTANEOUS

## 2019-12-09 MED ORDER — SODIUM CHLORIDE 0.9 % IV SOLN: INTRAVENOUS | Status: AC

## 2019-12-09 MED ORDER — HYDROCORTISONE 2.5 % EX CREA: TOPICAL_CREAM | Freq: Two times a day (BID) | CUTANEOUS | Status: DC

## 2019-12-09 MED ORDER — EZETIMIBE 10 MG PO TABS: 10.0000 mg | ORAL_TABLET | Freq: Every day | ORAL | Status: DC

## 2019-12-09 MED ORDER — SENNOSIDES-DOCUSATE SODIUM 8.6-50 MG PO TABS: 1.0000 | ORAL_TABLET | Freq: Every evening | ORAL | Status: DC | PRN

## 2019-12-09 MED ORDER — DOXEPIN HCL 10 MG PO CAPS
20.0000 mg | ORAL_CAPSULE | Freq: Every day | ORAL | Status: DC
  Administered 2019-12-09 – 2019-12-10 (×2): 20 mg via ORAL
  Filled 2019-12-09 (×4): qty 2

## 2019-12-09 MED ORDER — CALCIUM CARBONATE ANTACID 500 MG PO CHEW
500.0000 mg | CHEWABLE_TABLET | ORAL | Status: DC | PRN
  Administered 2019-12-10: 1500 mg via ORAL
  Filled 2019-12-09: qty 3

## 2019-12-09 MED ORDER — LACTATED RINGERS IV BOLUS
500.0000 mL | Freq: Once | INTRAVENOUS | Status: AC
  Administered 2019-12-09: 500 mL via INTRAVENOUS

## 2019-12-09 MED ORDER — AEROCHAMBER PLUS FLO-VU LARGE MISC
Status: AC
  Filled 2019-12-09: qty 1

## 2019-12-09 MED ORDER — ZOLPIDEM TARTRATE 5 MG PO TABS
5.0000 mg | ORAL_TABLET | Freq: Every day | ORAL | Status: DC
  Administered 2019-12-09 – 2019-12-10 (×2): 5 mg via ORAL
  Filled 2019-12-09 (×2): qty 1

## 2019-12-09 MED ORDER — VANCOMYCIN HCL IN DEXTROSE 1-5 GM/200ML-% IV SOLN: 1000.0000 mg | Freq: Once | INTRAVENOUS | Status: DC

## 2019-12-09 MED ORDER — DEXAMETHASONE 6 MG PO TABS
6.0000 mg | ORAL_TABLET | ORAL | Status: DC
  Administered 2019-12-09: 6 mg via ORAL
  Filled 2019-12-09: qty 2

## 2019-12-09 MED ORDER — TRAZODONE HCL 50 MG PO TABS
50.0000 mg | ORAL_TABLET | Freq: Every evening | ORAL | Status: DC | PRN
  Administered 2019-12-09 – 2019-12-10 (×2): 50 mg via ORAL
  Filled 2019-12-09 (×2): qty 1

## 2019-12-09 MED ORDER — SODIUM CHLORIDE 0.9 % IV SOLN
2.0000 g | Freq: Once | INTRAVENOUS | Status: AC
  Administered 2019-12-09: 2 g via INTRAVENOUS
  Filled 2019-12-09: qty 2

## 2019-12-09 MED ORDER — VANCOMYCIN HCL 1500 MG/300ML IV SOLN
1500.0000 mg | INTRAVENOUS | Status: DC
  Administered 2019-12-10: 14:00:00 1500 mg via INTRAVENOUS
  Filled 2019-12-09 (×2): qty 300

## 2019-12-09 MED ORDER — ACETAMINOPHEN 500 MG PO TABS
1000.0000 mg | ORAL_TABLET | Freq: Once | ORAL | Status: AC
  Administered 2019-12-09: 1000 mg via ORAL
  Filled 2019-12-09 (×2): qty 2

## 2019-12-09 MED ORDER — ROSUVASTATIN CALCIUM 5 MG PO TABS
5.0000 mg | ORAL_TABLET | Freq: Every day | ORAL | Status: DC
  Administered 2019-12-09 – 2019-12-11 (×3): 5 mg via ORAL
  Filled 2019-12-09 (×3): qty 1

## 2019-12-09 MED ORDER — ALBUTEROL SULFATE HFA 108 (90 BASE) MCG/ACT IN AERS
2.0000 | INHALATION_SPRAY | Freq: Four times a day (QID) | RESPIRATORY_TRACT | Status: DC
  Administered 2019-12-09 – 2019-12-10 (×4): 2 via RESPIRATORY_TRACT
  Filled 2019-12-09: qty 6.7

## 2019-12-09 MED ORDER — LACTATED RINGERS IV BOLUS: 1000.0000 mL | Freq: Once | INTRAVENOUS | Status: DC

## 2019-12-09 MED ORDER — ALPRAZOLAM 0.5 MG PO TABS
1.0000 mg | ORAL_TABLET | Freq: Every day | ORAL | Status: DC
  Administered 2019-12-09 – 2019-12-10 (×2): 1 mg via ORAL
  Filled 2019-12-09 (×2): qty 2

## 2019-12-09 MED ORDER — CARISOPRODOL 350 MG PO TABS
350.0000 mg | ORAL_TABLET | Freq: Three times a day (TID) | ORAL | Status: DC
  Administered 2019-12-09 – 2019-12-11 (×6): 350 mg via ORAL
  Filled 2019-12-09 (×6): qty 1

## 2019-12-09 MED ORDER — SODIUM CHLORIDE 0.9 % IV SOLN
200.0000 mg | Freq: Once | INTRAVENOUS | Status: AC
  Administered 2019-12-09: 200 mg via INTRAVENOUS
  Filled 2019-12-09: qty 200

## 2019-12-09 MED ORDER — SERTRALINE HCL 100 MG PO TABS: 200.0000 mg | ORAL_TABLET | Freq: Every day | ORAL | Status: DC

## 2019-12-09 MED ORDER — PANTOPRAZOLE SODIUM 40 MG PO TBEC
40.0000 mg | DELAYED_RELEASE_TABLET | Freq: Every day | ORAL | Status: DC
  Administered 2019-12-09 – 2019-12-11 (×3): 40 mg via ORAL
  Filled 2019-12-09 (×3): qty 1

## 2019-12-09 MED ORDER — VALACYCLOVIR HCL 500 MG PO TABS
1000.0000 mg | ORAL_TABLET | Freq: Three times a day (TID) | ORAL | Status: DC | PRN
  Filled 2019-12-09: qty 2

## 2019-12-09 MED ORDER — AEROCHAMBER PLUS FLO-VU LARGE MISC
1.0000 | Freq: Once | Status: AC
  Administered 2019-12-09: 1

## 2019-12-09 MED ORDER — SODIUM CHLORIDE 0.9 % IV SOLN
100.0000 mg | Freq: Every day | INTRAVENOUS | Status: DC
  Administered 2019-12-10 – 2019-12-11 (×2): 100 mg via INTRAVENOUS
  Filled 2019-12-09 (×3): qty 20

## 2019-12-09 MED ORDER — VANCOMYCIN HCL 2000 MG/400ML IV SOLN
2000.0000 mg | Freq: Once | INTRAVENOUS | Status: AC
  Administered 2019-12-09: 2000 mg via INTRAVENOUS
  Filled 2019-12-09: qty 400

## 2019-12-09 MED ORDER — APIXABAN 5 MG PO TABS
5.0000 mg | ORAL_TABLET | Freq: Two times a day (BID) | ORAL | Status: DC
  Administered 2019-12-09 – 2019-12-11 (×4): 5 mg via ORAL
  Filled 2019-12-09 (×5): qty 1

## 2019-12-09 MED ORDER — FLUTICASONE FUROATE-VILANTEROL 200-25 MCG/INH IN AEPB
1.0000 | INHALATION_SPRAY | Freq: Every day | RESPIRATORY_TRACT | Status: DC
  Administered 2019-12-10 – 2019-12-11 (×2): 1 via RESPIRATORY_TRACT
  Filled 2019-12-09: qty 28

## 2019-12-09 MED ORDER — TRAMADOL HCL 50 MG PO TABS
50.0000 mg | ORAL_TABLET | Freq: Four times a day (QID) | ORAL | Status: DC | PRN
  Administered 2019-12-09 – 2019-12-11 (×3): 50 mg via ORAL
  Filled 2019-12-09 (×2): qty 1

## 2019-12-09 MED ORDER — DIGOXIN 125 MCG PO TABS
125.0000 ug | ORAL_TABLET | Freq: Every day | ORAL | Status: DC
  Administered 2019-12-09 – 2019-12-11 (×3): 125 ug via ORAL
  Filled 2019-12-09 (×3): qty 1

## 2019-12-09 MED ORDER — ACETAMINOPHEN 325 MG PO TABS
650.0000 mg | ORAL_TABLET | Freq: Four times a day (QID) | ORAL | Status: DC | PRN
  Administered 2019-12-10 (×2): 650 mg via ORAL
  Filled 2019-12-09 (×2): qty 2

## 2019-12-09 NOTE — Sepsis Progress Note (Signed)
Notified provider of need to order fluid bolus.  ?

## 2019-12-09 NOTE — Sepsis Progress Note (Signed)
Notified bedside nurse of need to draw lactic acid.  

## 2019-12-09 NOTE — ED Provider Notes (Signed)
[facility] EMERGENCY DEPARTMENT Provider Note   CSN: [id number] Arrival date & time: 12/09/19  [id number]     History Chief Complaint  Patient presents with  . Hypotension    [name] is a 64 y.o. male.  HPI Patient reports he started getting sick on 2\10.  He was diagnosed with Covid on 2\12.  Over the past week to several days symptoms have gotten much worse.  He reports he has had shaking chills for couple of days now.  He is felt generally weak but no syncopal episodes.  He reports he is able to take in fluids but is not eating much.  He reports he is thirsty all the time.  No vomiting.  Very small amounts of diarrhea.  He denies any bloody or black appearing bowel movement.  He reports he is having some cough which is much worse at night.  No significant chest pain.  Patient does feel mildly short of breath.  He has been monitoring his blood pressure at home and now has noted that the blood pressure has gotten low.  He contacted his cardiology office today and they recommended he get to the emergency department.  Patient reports he had been seen 2\22 and diagnosed with bronchitis.    Past Medical History:  Diagnosis Date  . AICD (automatic cardioverter/defibrillator) present   . Anemia    supposed to be taking Vit B but doesn't  . ANXIETY    takes Xanax nightly  . Arthritis   . Asthma    Albuterol prn and Advair daily;also takes Prednisone daily  . Atrial fibrillation ([place]) 09/06/2015  . Cardiomyopathy [facility])    a. EF 25% TEE July 2013; b. EF normalized 2015;  c. 03/2015 Echo: EF 40-45%, difrf HK, PASP 38 mmHg, Mild MR, sev LAE/RAE.  [name] Chronic constipation    takes OTC stool softener  . COPD (chronic obstructive pulmonary disease) ([place])    "one dr says COPD; one dr says emphysema" (09/18/2017)  . DEPRESSION    takes Zoloft and Doxepin daily  . Diverticulitis   . DYSKINESIA, ESOPHAGUS   . Emphysema of lung [facility])    "one dr says COPD; one dr says  emphysema" (09/18/2017)  . Essential hypertension       . FIBROMYALGIA   . GERD (gastroesophageal reflux disease)       . Glaucoma   . HYPERLIPIDEMIA    a. Intolerant to statins.  . INSOMNIA    takes Ambien nightly  . Myocardial infarction [facility])    a. 2012 Myoview notable for prior infarct;  b. 03/2015 Lexiscan CL: EF 37%, diff HK, small area of inferior infarct from apex to base-->Med Rx.  . Myocardial infarction ([place])   . O2 dependent    "2.5L q hs & prn" (09/18/2017)  . Paroxysmal atrial fibrillation (HCC)    a. CHA2DS2VASc = 3--> takes Coumadin;  b. 03/15/2015 Successful TEE/DCCV;  c. 03/2015 recurrent afib, Amio d/c'd in setting of hyperthyroidism.  . Peripheral neuropathy   . Pneumonia 12/2016  . Rash and other nonspecific skin eruption 04/12/2009   no cause found saw dermatologists x 2 and allergist  . SLEEP APNEA, OBSTRUCTIVE    a. doesn't use CPAP  . Syncope    a. 03/2015 s/p MDT LINQ.  [name] Type II diabetes mellitus [facility])         Patient Active Problem List   Diagnosis Date Noted  . Upper respiratory infection 11/27/2019  . COPD exacerbation ([place])  11/27/2019  . Elevated PSA 09/23/2019  . Left rotator cuff tear arthropathy 08/06/2019  . Lip lesion 12/23/2018  . Diabetic ulcer of left lower leg with necrosis of muscle ([place]) 12/05/2018  . Chronic bursitis of left shoulder 07/01/2018  . Pain due to total knee replacement ([place]) 03/31/2018  . Rotator cuff arthropathy, left 02/13/2018  . Right rotator cuff tear arthropathy 01/14/2018  . Increased prostate specific antigen (PSA) velocity 09/26/2017  . Trigger point of thoracic region 07/04/2017  . Right lumbar radiculopathy 05/15/2017  . Right leg swelling 05/09/2017  . Glaucoma 04/18/2017  . History of nasal polyposis 02/19/2017  . Chronic rhinitis 02/19/2017  . Microhematuria 12/04/2016  . Preventative health care 11/29/2015  . Type II diabetes mellitus ([place])   . Hyperlipidemia   . Persistent atrial fibrillation ([place])  03/30/2015  . Syncope 03/30/2015  . Anemia 02/23/2015  . Thyrotoxicosis 02/23/2015  . Hx of adenomatous colonic polyps 10/21/2014  . De Quervain's tenosynovitis, right 04/30/2014  . Osteoarthritis of right knee 02/19/2014  . Encounter for therapeutic drug monitoring 11/17/2013  . Thoracic degenerative disc disease 06/17/2013  . Thoracic spondylosis without myelopathy 02/11/2013  . Lumbosacral spondylosis without myelopathy 02/11/2013  . Degenerative joint disease of knee, left 02/11/2013  . COPD (chronic obstructive pulmonary disease) ([place]) 08/20/2012  . Bundle branch block, right and extreme left anterior fascicular 05/05/2012  . Nonischemic cardiomyopathy ([place]) 05/02/2012  . Anticoagulated on warfarin 03/21/2012  . Spongiotic dermatitis 09/26/2011  . Past myocardial infarction 04/19/2011  . UNSPECIFIED URETHRAL STRICTURE 12/26/2010  . Chronic pain syndrome 05/18/2010  . Obstructive sleep apnea 10/21/2007  . NEPHROLITHIASIS, HX OF 10/21/2007  . Anxiety 06/09/2007  . GERD 06/09/2007  . BENIGN PROSTATIC HYPERTROPHY 06/09/2007  . Morbid obesity ([place]) 06/03/2007  . Depression 06/03/2007  . Essential hypertension 06/03/2007  . INSOMNIA 06/03/2007    Past Surgical History:  Procedure Laterality Date  . ACNE CYST REMOVAL     2 on back   . AV NODE ABLATION N/A 10/25/2017   Procedure: AV NODE ABLATION;  Surgeon: Deboraha Sprang, MD;  Location: [place] CV LAB;  Service: Cardiovascular;  Laterality: N/A;  . BIV ICD INSERTION CRT-D N/A 09/18/2017   Procedure: BIV ICD INSERTION CRT-D;  Surgeon: Deboraha Sprang, MD;  Location: [place] CV LAB;  Service: Cardiovascular;  Laterality: N/A;  . CARDIAC CATHETERIZATION N/A 03/21/2016   Procedure: Right/Left Heart Cath and Coronary Angiography;  Surgeon: Larey Dresser, MD;  Location: [place] CV LAB;  Service: Cardiovascular;  Laterality: N/A;  . CARDIOVERSION  04/18/2012   Procedure: CARDIOVERSION;  Surgeon: Fay Records, MD;  Location:  [place];  Service: Cardiovascular;  Laterality: N/A;  . CARDIOVERSION  04/25/2012   Procedure: CARDIOVERSION;  Surgeon: Thayer Headings, MD;  Location: [place];  Service: Cardiovascular;  Laterality: N/A;  . CARDIOVERSION  04/25/2012   Procedure: CARDIOVERSION;  Surgeon: Fay Records, MD;  Location: [place];  Service: Cardiovascular;  Laterality: N/A;  . CARDIOVERSION  05/09/2012   Procedure: CARDIOVERSION;  Surgeon: Sherren Mocha, MD;  Location: [place];  Service: Cardiovascular;  Laterality: N/A;  changed from crenshaw to cooper by trish/leone-endo  . CARDIOVERSION N/A 03/15/2015   Procedure: CARDIOVERSION;  Surgeon: Thayer Headings, MD;  Location: [facility] ENDOSCOPY;  Service: Cardiovascular;  Laterality: N/A;  . COLONOSCOPY    . COLONOSCOPY WITH PROPOFOL N/A 10/21/2014   Procedure: COLONOSCOPY WITH PROPOFOL;  Surgeon: Ladene Artist, MD;  Location: [facility] ENDOSCOPY;  Service: Endoscopy;  Laterality:  N/A;  . EP IMPLANTABLE DEVICE N/A 04/06/2015   Procedure: Loop Recorder Insertion;  Surgeon: Evans Lance, MD;  Location: [place] CV LAB;  Service: Cardiovascular;  Laterality: N/A;  . ESOPHAGOGASTRODUODENOSCOPY    . JOINT REPLACEMENT    . LOOP RECORDER REMOVAL N/A 09/18/2017   Procedure: LOOP RECORDER REMOVAL;  Surgeon: Deboraha Sprang, MD;  Location: [place] CV LAB;  Service: Cardiovascular;  Laterality: N/A;  . RIGHT/LEFT HEART CATH AND CORONARY ANGIOGRAPHY N/A 01/28/2017   Procedure: Right/Left Heart Cath and Coronary Angiography;  Surgeon: Larey Dresser, MD;  Location: [place] CV LAB;  Service: Cardiovascular;  Laterality: N/A;  . TEE WITHOUT CARDIOVERSION  04/25/2012   Procedure: TRANSESOPHAGEAL ECHOCARDIOGRAM (TEE);  Surgeon: Thayer Headings, MD;  Location: [place];  Service: Cardiovascular;  Laterality: N/A;  . TEE WITHOUT CARDIOVERSION N/A 03/15/2015   Procedure: TRANSESOPHAGEAL ECHOCARDIOGRAM (TEE);  Surgeon: Thayer Headings, MD;  Location: [place];  Service: Cardiovascular;   Laterality: N/A;  . TONSILLECTOMY AND ADENOIDECTOMY    . TOTAL KNEE ARTHROPLASTY Right 06/15/2014   Procedure: TOTAL KNEE ARTHROPLASTY;  Surgeon: Renette Butters, MD;  Location: [place];  Service: Orthopedics;  Laterality: Right;       Family History  Problem Relation Age of Onset  . COPD Mother   . Asthma Mother   . Colon polyps Mother   . Allergies Mother   . Hypothyroidism Mother   . Asthma Maternal Grandmother   . Colon cancer Neg Hx     Social History   Tobacco Use  . Smoking status: Former Smoker    Packs/day: 2.00    Years: 30.00    Pack years: 60.00    Types: Cigarettes    Quit date: 10/16/2007    Years since quitting: 12.1  . Smokeless tobacco: Never Used  Substance Use Topics  . Alcohol use: No  . Drug use: No    Home Medications Prior to Admission medications   Medication Sig Start Date End Date Taking? Authorizing Provider  acetaminophen (TYLENOL) 500 MG tablet Take 500 mg by mouth every 6 (six) hours as needed.    [provider]  albuterol (VENTOLIN HFA) 108 (90 Base) MCG/ACT inhaler INHALE 2 PUFFS 4 TIMES A DAY as needed 11/27/19   [name], MD  ALPRAZolam Duanne Moron) 0.5 MG tablet TAKE 2 TABLETS BY MOUTH AT BEDTIME 11/11/19   [name], MD  apixaban (ELIQUIS) 5 MG TABS tablet Take 1 tablet (5 mg total) by mouth 2 (two) times daily. 11/11/19   Larey Dresser, MD  benzonatate (TESSALON) 100 MG capsule Take 1-2 capsules (100-200 mg total) by mouth 3 (three) times daily as needed for cough. 12/07/19   Scot Jun, FNP  carisoprodol (SOMA) 350 MG tablet TAKE 1 TABLET BY MOUTH THREE TIMES A DAY AS NEEDED FOR MUSCLE SPASMS 09/04/19   [name], MD  carvedilol (COREG) 12.5 MG tablet TAKE 1 TABLET (12.5 MG TOTAL) BY MOUTH 2 (TWO) TIMES DAILY WITH A MEAL. 07/07/18   Larey Dresser, MD  cephALEXin (KEFLEX) 500 MG capsule Take 1 capsule (500 mg total) by mouth 2 (two) times daily. 12/24/18   Lyndal Pulley, DO  clobetasol ointment (TEMOVATE) [id number]  % Apply 1 application topically 2 (two) times daily. 09/17/17   Janith Lima, MD  digoxin (LANOXIN) 0.25 MG tablet TAKE 1/2 TABLET BY MOUTH EVERY DAY 11/25/19   Larey Dresser, MD  doxepin (SINEQUAN) 10 MG capsule TAKE  2 CAPSULES BY MOUTH AT BEDTIME. ANNUAL APPT DUE IN [place] MUST SEE PROVIDER FOR FUTURE REFILLS 10/05/19   [name], MD  ezetimibe (ZETIA) 10 MG tablet Take 1 tablet (10 mg total) by mouth daily. 09/26/17   [name], MD  fluticasone (FLONASE) 50 MCG/ACT nasal spray PLACE 2 SPRAYS INTO BOTH NOSTRILS DAILY AS NEEDED FOR ALLERGIES OR RHINITIS. 10/15/19   [name], MD  Fluticasone-Salmeterol (ADVAIR DISKUS) 250-50 MCG/DOSE AEPB Inhale 1 puff into the lungs 2 (two) times daily. 05/08/17   [name], MD  hydrocortisone 2.5 % cream APPLY TO AFFECTED AREA TWICE A DAY 07/27/19   [name], MD  hydrOXYzine (ATARAX/VISTARIL) 10 MG tablet TAKE 1 TABLET BY MOUTH THREE TIMES A DAY AS NEEDED 04/27/19   [name], MD  ketoconazole (NIZORAL) 2 % cream APPLY TO AFFECTED AREA EVERY DAY 07/22/18   [name], MD  lidocaine (LIDODERM) 5 % APPLY 1 PATCH TO AFFECTED AREA FOR UP TO 12 HOURS . DO NOT USE MORE THAN ONE PATCH IN 24 HOURS. 07/13/19   [name], MD  methotrexate (RHEUMATREX) 2.5 MG tablet TAKE 4 TABLETS BY MOUTH EVERY WEEK 11/10/18   [name], MD  omeprazole (PRILOSEC) 20 MG capsule Take 1 capsule (20 mg total) by mouth daily with supper. 08/27/16   [name], MD  predniSONE (DELTASONE) 50 MG tablet Take 1 tablet (50 mg total) by mouth daily with breakfast. 12/07/19   Scot Jun, FNP  rosuvastatin (CRESTOR) 5 MG tablet Take 1 tablet (5 mg total) by mouth daily. 09/24/19 12/23/19  Larey Dresser, MD  sacubitril-valsartan (ENTRESTO) 24-26 MG Take 1 tablet by mouth 2 (two) times daily. 11/06/19   Larey Dresser, MD  sertraline (ZOLOFT) 100 MG tablet TAKE 2 TABLETS BY MOUTH AT BEDTIME 03/26/17   [name], MD  silver sulfADIAZINE (SILVADENE) 1 % cream  APPLY TOPICALLY TO AFFECTED AREA EVERY DAY 03/11/19   [name], MD  spironolactone (ALDACTONE) 25 MG tablet TAKE 1 TABLET BY MOUTH EVERY DAY 11/02/19   Larey Dresser, MD  torsemide (DEMADEX) 20 MG tablet TAKE 2 TABLETS BY MOUTH TWICE A DAY 07/20/19   Larey Dresser, MD  traMADol (ULTRAM) 50 MG tablet Take 1 tablet (50 mg total) by mouth daily as needed. 10/24/19   [name], MD  traZODone (DESYREL) 50 MG tablet TAKE 1 TABLET (50 MG TOTAL) BY MOUTH AT BEDTIME AS NEEDED FOR SLEEP. 09/14/19   [name], MD  triamcinolone (KENALOG) 0.1 % paste APPLY 1 APPLICATION IN THE MOUTH OR THROAT 2 (TWO) TIMES DAILY AS DIRECTED 11/16/19   [name], MD  TUMS 500 MG chewable tablet Chew 500-2,000 mg by mouth every 4 (four) hours as needed for indigestion or heartburn.  10/19/16   [provider]  valACYclovir (VALTREX) 1000 MG tablet TAKE 1 TABLET BY MOUTH THREE TIMES A DAY 12/02/19   [name], MD  venlafaxine XR (EFFEXOR-XR) 37.5 MG 24 hr capsule TAKE ONE CAPSULE BY MOUTH EVERY DAY WITH BREAKFAST 11/25/19   Lyndal Pulley, DO  zolpidem (AMBIEN) 10 MG tablet TAKE 1 TABLET BY MOUTH EVERYDAY AT BEDTIME 09/21/19   [name], MD    Allergies    Amiodarone, Statins, and Tape  Review of Systems   Review of Systems 10 Systems reviewed and are negative for acute change except as noted in the HPI.  Physical Exam Updated  Vital Signs BP (!) 70/46   Pulse 70   Temp 98.4 F (36.9 C) (Oral)   Resp 20   Ht 5\' 10"  (1.778 m)   Wt 107.5 kg   SpO2 96%   BMI 34.01 kg/m   Physical Exam Constitutional:      Comments: Patient is alert with clear mental status.  Mild tachypnea.  No distress at rest.  Speech is clear.  HENT:     Head: Normocephalic and atraumatic.     Mouth/Throat:     Mouth: Mucous membranes are moist.     Pharynx: Oropharynx is clear.  Eyes:     Extraocular Movements: Extraocular movements intact.     Conjunctiva/sclera: Conjunctivae normal.  Cardiovascular:      Rate and Rhythm: Normal rate and regular rhythm.     Pulses: Normal pulses.  Pulmonary:     Comments: Tachypnea.  Soft breath sounds with slight crackles in the lower to mid lung fields.  No gross wheeze or rhonchi. Abdominal:     General: There is no distension.     Palpations: Abdomen is soft.     Tenderness: There is no abdominal tenderness. There is no guarding.  Musculoskeletal:        General: No swelling or tenderness. Normal range of motion.     Cervical back: Neck supple.     Right lower leg: No edema.     Left lower leg: No edema.  Skin:    General: Skin is warm and dry.  Neurological:     General: No focal deficit present.     Mental Status: He is oriented to person, place, and time.     Motor: No weakness.  Psychiatric:        Mood and Affect: Mood normal.     ED Results / Procedures / Treatments   Labs (all labs ordered are listed, but only abnormal results are displayed) Labs Reviewed  CBC WITH DIFFERENTIAL/PLATELET - Abnormal; Notable for the following components:      Result Value   RBC 3.59 (*)    Hemoglobin 12.6 (*)    HCT 36.1 (*)    MCV 100.6 (*)    MCH 35.1 (*)    Platelets 104 (*)    Lymphs Abs 0.4 (*)    All other components within normal limits  COMPREHENSIVE METABOLIC PANEL - Abnormal; Notable for the following components:   Sodium 133 (*)    Glucose, Bld 110 (*)    BUN 27 (*)    Calcium 8.5 (*)    Total Protein 5.3 (*)    Albumin 2.8 (*)    All other components within normal limits  D-DIMER, QUANTITATIVE (NOT AT [facility]) - Abnormal; Notable for the following components:   D-Dimer, Quant 12.38 (*)    All other components within normal limits  FIBRINOGEN - Abnormal; Notable for the following components:   Fibrinogen 516 (*)    All other components within normal limits  C-REACTIVE PROTEIN - Abnormal; Notable for the following components:   CRP 9.4 (*)    All other components within normal limits  BRAIN NATRIURETIC PEPTIDE - Abnormal; Notable  for the following components:   B Natriuretic Peptide 129.6 (*)    All other components within normal limits  DIGOXIN LEVEL - Abnormal; Notable for the following components:   Digoxin Level 0.4 (*)    All other components within normal limits  TROPONIN I (HIGH SENSITIVITY) - Abnormal; Notable for the following components:   Troponin  I (High Sensitivity) 26 (*)    All other components within normal limits  CULTURE, BLOOD (ROUTINE X 2)  CULTURE, BLOOD (ROUTINE X 2)  URINE CULTURE  RESPIRATORY PANEL BY RT PCR (FLU A&B, COVID)  LACTIC ACID, PLASMA  LACTATE DEHYDROGENASE  FERRITIN  APTT  PROTIME-INR  LACTIC ACID, PLASMA  PROCALCITONIN  TRIGLYCERIDES  URINALYSIS, ROUTINE W REFLEX MICROSCOPIC  POC SARS CORONAVIRUS 2 AG -  ED  TROPONIN I (HIGH SENSITIVITY)    EKG NoneMuse not opening for read    Radiology DG Chest Portable 2 Views  Result Date: 12/07/2019 CLINICAL DATA:  Dyspnea. Shortness of breath. EXAM: CHEST  2 VIEW PORTABLE COMPARISON:  01/14/2018 FINDINGS: Left-sided pacemaker in place. Unchanged hyperinflation and bronchial thickening. Unchanged mild cardiomegaly. Stable mediastinal contours. No pleural effusion, pulmonary edema, focal airspace disease or pneumothorax. Mild lingular scarring. No acute osseous abnormalities are seen. IMPRESSION: Stable hyperinflation and bronchial thickening.  No acute findings. Electronically Signed   By: Keith Rake M.D.   On: 12/07/2019 20:28   DG Chest Port 1 View  Result Date: 12/09/2019 CLINICAL DATA:  COVID positive. EXAM: PORTABLE CHEST 1 VIEW COMPARISON:  12/07/2019 FINDINGS: Cardiac enlargement. Left chest wall ICD is noted with leads in the right atrial appendage and right ventricle. No pleural effusion or edema. No airspace densities identified. The visualized osseous structures are unremarkable. IMPRESSION: 1. No acute findings. 2. Cardiac enlargement. Electronically Signed   By: Kerby Moors M.D.   On: 12/09/2019 11:56     Procedures Procedures (including critical care time) CRITICAL CARE Performed by: Charlesetta Shanks   Total critical care time:30 minutes  Critical care time was exclusive of separately billable procedures and treating other patients.  Critical care was necessary to treat or prevent imminent or life-threatening deterioration.  Critical care was time spent personally by me on the following activities: development of treatment plan with patient and/or surrogate as well as nursing, discussions with consultants, evaluation of patient's response to treatment, examination of patient, obtaining history from patient or surrogate, ordering and performing treatments and interventions, ordering and review of laboratory studies, ordering and review of radiographic studies, pulse oximetry and re-evaluation of patient's condition. Medications Ordered in ED Medications  vancomycin (VANCOREADY) IVPB 2000 mg/400 mL (2,000 mg Intravenous New Bag/Given 12/09/19 1111)  ceFEPIme (MAXIPIME) 2 g in sodium chloride 0.9 % 100 mL IVPB (has no administration in time range)  vancomycin (VANCOREADY) IVPB 1500 mg/300 mL (has no administration in time range)  ceFEPIme (MAXIPIME) 2 g in sodium chloride 0.9 % 100 mL IVPB (0 g Intravenous Stopped 12/09/19 1133)  lactated ringers bolus 500 mL (0 mLs Intravenous Stopped 12/09/19 1113)  acetaminophen (TYLENOL) tablet 1,000 mg (1,000 mg Oral Given 12/09/19 1028)  lactated ringers bolus 500 mL (0 mLs Intravenous Stopped 12/09/19 1156)  lactated ringers bolus 500 mL (500 mLs Intravenous New Bag/Given 12/09/19 1157)    ED Course  I have reviewed the triage vital signs and the nursing notes.  Pertinent labs & imaging results that were available during my care of the patient were reviewed by me and considered in my medical decision making (see chart for details).  Clinical Course as of Dec 08 1229  Wed Dec 09, 2019  1143 Patient's clinical status remained stable.  His mental  status is clear.  He is not have significant respiratory distress at rest.  He has had 1 500 mL bolus of LR.  Blood pressures remain in the high 70s to mid [id number] systolic.  Patient does not endorse that he feels any more short of breath.  Will repeat a second 500 mL bolus of LR.   [MP]  1218 Clinical appearance remains stable.  Will add additional liter of lactated Ringer's.   [MP]  [id number] Systolic blood pressure is 87.  Heart rate is in the 70s paced.  Patient does not perceive he has any increased shortness of breath.  He is alert and making a phone call.   [MP]    Clinical Course User Index [MP] Charlesetta Shanks, MD   MDM Rules/Calculators/A&P                      Consult: Dr. Roosevelt Locks for admission.  We reviewed the patient's medical history and history of present illness.  At this time, with lactic acid normal, suggests more conservative fluid management despite hypotension.  Clinically, patient does not show signs of hypoperfusion.  His mental status is good.  Respiratory status remains stable.  Patient will have a total fluid input including antibiotics of 2 L..  Patient presents aligned above.  He does have history of positive outpatient Covid testing approximately 2 weeks ago.  He has gotten worse with increasing shortness of breath and chills and fever.  Patient's mental status is alert and appropriate.  Baseline oxygen on room air is at approximately 93%.  Patient has been placed on 1 L nasal cannula oxygen.  We will proceed with PE study to differentiate advancing Covid pneumonia\PE\bacterial pneumonia\CHF.  Final Clinical Impression(s) / ED Diagnoses Final diagnoses:  Sepsis, due to unspecified organism, unspecified whether acute organ dysfunction present [facility])  History of COVID-19  Severe comorbid illness    Rx / DC Orders ED Discharge Orders    None       Charlesetta Shanks, MD 12/09/19 1236

## 2019-12-09 NOTE — ED Triage Notes (Signed)
Per pt: he was sent over by the cardiac rehab clinic due to hypotension. Pt was dx with COVID 19 on 11/27/19. Recently also diagnosed with bronchitis. Pt complains of cough at night. Pt also has a Automotive engineer.

## 2019-12-09 NOTE — H&P (Signed)
History and Physical    [name] [id number] DOB: September 27, 1956 DOA: 12/09/2019  PCP: [name], MD   Patient coming from: Home  I have personally briefly reviewed patient's old medical records in [place]  Chief Complaint: Cough, SOB  HPI: [name] is a 64 y.o. male with medical history significant of chronic systolic CHF, chronic A. fib on Eliquis, COPD on as needed home oxygen, anxiety depression, presented with increasing cough and short of breath.  His symptoms started about 2 weeks ago, initially was dry cough, diagnosed with Covid on 2\12.  Over the past week to several days symptoms have gotten much worse.  He reports he has had shaking chills for couple of days now.  He has been feeling generalized weakness, no loss of taste but significant decrease of oral intake, only able to take in fluids but is not eating much.  He reports he is thirsty all the time.  No vomiting.  Very small amounts of diarrhea.    He contacted his PCP 2 days ago, and was started on p.o. steroid and p.o. Keflex. He admitted started first dose of 40 mg of prednisone yesterday.  He has been monitoring his blood pressure at home and now has noted that the blood pressure has gotten low.  He contacted his cardiology office today and they recommended he get to the emergency department.  ED Course: Borderline low blood pressure, was given a total of about 2 L IV boluses, x-ray showed no significant infiltrates, lactic acid normal.  Review of Systems: As per HPI otherwise 10 point review of systems negative.    Past Medical History:  Diagnosis Date  . AICD (automatic cardioverter/defibrillator) present   . Anemia    supposed to be taking Vit B but doesn't  . ANXIETY    takes Xanax nightly  . Arthritis   . Asthma    Albuterol prn and Advair daily;also takes Prednisone daily  . Atrial fibrillation ([place]) 09/06/2015  . Cardiomyopathy [facility])    a. EF 25% TEE July 2013; b. EF normalized 2015;  c.  03/2015 Echo: EF 40-45%, difrf HK, PASP 38 mmHg, Mild MR, sev LAE/RAE.  [name] Chronic constipation    takes OTC stool softener  . COPD (chronic obstructive pulmonary disease) ([place])    "one dr says COPD; one dr says emphysema" (09/18/2017)  . DEPRESSION    takes Zoloft and Doxepin daily  . Diverticulitis   . DYSKINESIA, ESOPHAGUS   . Emphysema of lung [facility])    "one dr says COPD; one dr says emphysema" (09/18/2017)  . Essential hypertension       . FIBROMYALGIA   . GERD (gastroesophageal reflux disease)       . Glaucoma   . HYPERLIPIDEMIA    a. Intolerant to statins.  . INSOMNIA    takes Ambien nightly  . Myocardial infarction [facility]    a. 2012 Myoview notable for prior infarct;  b. 03/2015 Lexiscan CL: EF 37%, diff HK, small area of inferior infarct from apex to base-->Med Rx.  . Myocardial infarction ([place])   . O2 dependent    "2.5L q hs & prn" (09/18/2017)  . Paroxysmal atrial fibrillation (H[facility])    a. CHA2DS2VASc = 3--> takes Coumadin;  b. 03/15/2015 Successful TEE/DCCV;  c. 03/2015 recurrent afib, Amio d/c'd in setting of hyperthyroidism.  . Peripheral neuropathy   . Pneumonia 12/2016  . Rash and other nonspecific skin eruption 04/12/2009   no cause found saw dermatologists  x 2 and allergist  . SLEEP APNEA, OBSTRUCTIVE    a. doesn't use CPAP  . Syncope    a. 03/2015 s/p MDT LINQ.  [name] Type II diabetes mellitus ([place])         Past Surgical History:  Procedure Laterality Date  . ACNE CYST REMOVAL     2 on back   . AV NODE ABLATION N/A 10/25/2017   Procedure: AV NODE ABLATION;  Surgeon: Deboraha Sprang, MD;  Location: [place] CV LAB;  Service: Cardiovascular;  Laterality: N/A;  . BIV ICD INSERTION CRT-D N/A 09/18/2017   Procedure: BIV ICD INSERTION CRT-D;  Surgeon: Deboraha Sprang, MD;  Location: [place] CV LAB;  Service: Cardiovascular;  Laterality: N/A;  . CARDIAC CATHETERIZATION N/A 03/21/2016   Procedure: Right/Left Heart Cath and Coronary Angiography;  Surgeon: Larey Dresser,  MD;  Location: [place] CV LAB;  Service: Cardiovascular;  Laterality: N/A;  . CARDIOVERSION  04/18/2012   Procedure: CARDIOVERSION;  Surgeon: Fay Records, MD;  Location: [place];  Service: Cardiovascular;  Laterality: N/A;  . CARDIOVERSION  04/25/2012   Procedure: CARDIOVERSION;  Surgeon: Thayer Headings, MD;  Location: [place];  Service: Cardiovascular;  Laterality: N/A;  . CARDIOVERSION  04/25/2012   Procedure: CARDIOVERSION;  Surgeon: Fay Records, MD;  Location: [place];  Service: Cardiovascular;  Laterality: N/A;  . CARDIOVERSION  05/09/2012   Procedure: CARDIOVERSION;  Surgeon: Sherren Mocha, MD;  Location: [place];  Service: Cardiovascular;  Laterality: N/A;  changed from crenshaw to cooper by trish/leone-endo  . CARDIOVERSION N/A 03/15/2015   Procedure: CARDIOVERSION;  Surgeon: Thayer Headings, MD;  Location: [facility] ENDOSCOPY;  Service: Cardiovascular;  Laterality: N/A;  . COLONOSCOPY    . COLONOSCOPY WITH PROPOFOL N/A 10/21/2014   Procedure: COLONOSCOPY WITH PROPOFOL;  Surgeon: Ladene Artist, MD;  Location: [facility] ENDOSCOPY;  Service: Endoscopy;  Laterality: N/A;  . EP IMPLANTABLE DEVICE N/A 04/06/2015   Procedure: Loop Recorder Insertion;  Surgeon: Evans Lance, MD;  Location: [place] CV LAB;  Service: Cardiovascular;  Laterality: N/A;  . ESOPHAGOGASTRODUODENOSCOPY    . JOINT REPLACEMENT    . LOOP RECORDER REMOVAL N/A 09/18/2017   Procedure: LOOP RECORDER REMOVAL;  Surgeon: Deboraha Sprang, MD;  Location: [place] CV LAB;  Service: Cardiovascular;  Laterality: N/A;  . RIGHT/LEFT HEART CATH AND CORONARY ANGIOGRAPHY N/A 01/28/2017   Procedure: Right/Left Heart Cath and Coronary Angiography;  Surgeon: Larey Dresser, MD;  Location: [place] CV LAB;  Service: Cardiovascular;  Laterality: N/A;  . TEE WITHOUT CARDIOVERSION  04/25/2012   Procedure: TRANSESOPHAGEAL ECHOCARDIOGRAM (TEE);  Surgeon: Thayer Headings, MD;  Location: [place];  Service: Cardiovascular;  Laterality: N/A;  .  TEE WITHOUT CARDIOVERSION N/A 03/15/2015   Procedure: TRANSESOPHAGEAL ECHOCARDIOGRAM (TEE);  Surgeon: Thayer Headings, MD;  Location: [place];  Service: Cardiovascular;  Laterality: N/A;  . TONSILLECTOMY AND ADENOIDECTOMY    . TOTAL KNEE ARTHROPLASTY Right 06/15/2014   Procedure: TOTAL KNEE ARTHROPLASTY;  Surgeon: Renette Butters, MD;  Location: [place];  Service: Orthopedics;  Laterality: Right;     reports that he quit smoking about 12 years ago. His smoking use included cigarettes. He has a 60.00 pack-year smoking history. He has never used smokeless tobacco. He reports that he does not drink alcohol or use drugs.  Allergies  Allergen Reactions  . Amiodarone Other (See Comments)    hyperthyroidism  . Statins Other (See Comments)    myalgia  .  Tape Other (See Comments)    Skin Tears Use Paper Tape Only    Family History  Problem Relation Age of Onset  . COPD Mother   . Asthma Mother   . Colon polyps Mother   . Allergies Mother   . Hypothyroidism Mother   . Asthma Maternal Grandmother   . Colon cancer Neg Hx      Prior to Admission medications   Medication Sig Start Date End Date Taking? Authorizing Provider  acetaminophen (TYLENOL) 500 MG tablet Take 500 mg by mouth every 6 (six) hours as needed.   Yes [provider]  albuterol (VENTOLIN HFA) 108 (90 Base) MCG/ACT inhaler INHALE 2 PUFFS 4 TIMES A DAY as needed Patient taking differently: Inhale 2 puffs into the lungs every 6 (six) hours as needed for wheezing or shortness of breath. INHALE 2 PUFFS 4 TIMES A DAY as needed 11/27/19  Yes [name], MD  ALPRAZolam Duanne Moron) 0.5 MG tablet TAKE 2 TABLETS BY MOUTH AT BEDTIME Patient taking differently: Take 1 mg by mouth at bedtime. TAKE 2 TABLETS BY MOUTH AT BEDTIME 11/11/19  Yes [name], MD  apixaban (ELIQUIS) 5 MG TABS tablet Take 1 tablet (5 mg total) by mouth 2 (two) times daily. 11/11/19  Yes Larey Dresser, MD  benzonatate (TESSALON) 100 MG capsule Take 1-2  capsules (100-200 mg total) by mouth 3 (three) times daily as needed for cough. 12/07/19  Yes Scot Jun, FNP  carisoprodol (SOMA) 350 MG tablet TAKE 1 TABLET BY MOUTH THREE TIMES A DAY AS NEEDED FOR MUSCLE SPASMS Patient taking differently: Take 350 mg by mouth 3 (three) times daily as needed for muscle spasms.  09/04/19  Yes [name], MD  carvedilol (COREG) 12.5 MG tablet TAKE 1 TABLET (12.5 MG TOTAL) BY MOUTH 2 (TWO) TIMES DAILY WITH A MEAL. 07/07/18  Yes Larey Dresser, MD  digoxin (LANOXIN) 0.25 MG tablet TAKE 1/2 TABLET BY MOUTH EVERY DAY 11/25/19  Yes Larey Dresser, MD  doxepin (SINEQUAN) 10 MG capsule TAKE 2 CAPSULES BY MOUTH AT BEDTIME. ANNUAL APPT DUE IN JUNE MUST SEE PROVIDER FOR FUTURE REFILLS Patient taking differently: Take 20 mg by mouth at bedtime.  10/05/19  Yes [name], MD  fluticasone (FLONASE) 50 MCG/ACT nasal spray PLACE 2 SPRAYS INTO BOTH NOSTRILS DAILY AS NEEDED FOR ALLERGIES OR RHINITIS. 10/15/19  Yes [name], MD  Fluticasone-Salmeterol (ADVAIR DISKUS) 250-50 MCG/DOSE AEPB Inhale 1 puff into the lungs 2 (two) times daily. 05/08/17  Yes [name], MD  hydrocortisone 2.5 % cream APPLY TO AFFECTED AREA TWICE A DAY Patient taking differently: Apply 1 application topically 2 (two) times daily.  07/27/19  Yes [name], MD  hydrOXYzine (ATARAX/VISTARIL) 10 MG tablet TAKE 1 TABLET BY MOUTH THREE TIMES A DAY AS NEEDED Patient taking differently: Take 10 mg by mouth 3 (three) times daily as needed for itching.  04/27/19  Yes [name], MD  methotrexate (RHEUMATREX) 2.5 MG tablet TAKE 4 TABLETS BY MOUTH EVERY WEEK Patient taking differently: Take 10 mg by mouth once a week. TAKE 4 TABLETS BY MOUTH EVERY WEEK on Sunday 11/10/18  Yes [name], MD  omeprazole (PRILOSEC) 20 MG capsule Take 1 capsule (20 mg total) by mouth daily with supper. 08/27/16  Yes [name], MD  predniSONE (DELTASONE) 50 MG tablet Take 1 tablet (50 mg total) by mouth daily  with breakfast. 12/07/19  Yes Scot Jun, FNP  rosuvastatin (CRESTOR) 5 MG tablet Take 1 tablet (5 mg total) by mouth daily. 09/24/19 12/23/19 Yes Larey Dresser, MD  sacubitril-valsartan (ENTRESTO) 24-26 MG Take 1 tablet by mouth 2 (two) times daily. 11/06/19  Yes Larey Dresser, MD  silver sulfADIAZINE (SILVADENE) 1 % cream APPLY TOPICALLY TO AFFECTED AREA EVERY DAY Patient taking differently: Apply 1 application topically daily as needed (irritated skin).  03/11/19  Yes [name], MD  spironolactone (ALDACTONE) 25 MG tablet TAKE 1 TABLET BY MOUTH EVERY DAY Patient taking differently: Take 25 mg by mouth daily.  11/02/19  Yes Larey Dresser, MD  torsemide (DEMADEX) 20 MG tablet TAKE 2 TABLETS BY MOUTH TWICE A DAY Patient taking differently: Take 40 mg by mouth 2 (two) times daily.  07/20/19  Yes Larey Dresser, MD  traMADol (ULTRAM) 50 MG tablet Take 1 tablet (50 mg total) by mouth daily as needed. 10/24/19  Yes [name], MD  traZODone (DESYREL) 50 MG tablet TAKE 1 TABLET (50 MG TOTAL) BY MOUTH AT BEDTIME AS NEEDED FOR SLEEP. 09/14/19  Yes [name], MD  triamcinolone (KENALOG) 0.1 % paste APPLY 1 APPLICATION IN THE MOUTH OR THROAT 2 (TWO) TIMES DAILY AS DIRECTED Patient taking differently: Use as directed 1 application in the mouth or throat 2 (two) times daily as needed (for blisters).  11/16/19  Yes [name], MD  TUMS 500 MG chewable tablet Chew 500-2,000 mg by mouth every 4 (four) hours as needed for indigestion or heartburn.  10/19/16  Yes [provider]  valACYclovir (VALTREX) 1000 MG tablet TAKE 1 TABLET BY MOUTH THREE TIMES A DAY Patient taking differently: Take 1,000 mg by mouth 3 (three) times daily as needed (cold sores).  12/02/19  Yes [name], MD  venlafaxine XR (EFFEXOR-XR) 37.5 MG 24 hr capsule TAKE ONE CAPSULE BY MOUTH EVERY DAY WITH BREAKFAST Patient taking differently: Take 37.5 mg by mouth daily. TAKE ONE CAPSULE BY MOUTH EVERY DAY WITH  BREAKFAST 11/25/19  Yes Hulan Saas M, DO  zolpidem (AMBIEN) 10 MG tablet TAKE 1 TABLET BY MOUTH EVERYDAY AT BEDTIME Patient taking differently: Take 10 mg by mouth at bedtime. TAKE 1 TABLET BY MOUTH EVERYDAY AT BEDTIME 09/21/19  Yes [name], MD  cephALEXin (KEFLEX) 500 MG capsule Take 1 capsule (500 mg total) by mouth 2 (two) times daily. Patient not taking: Reported on 12/09/2019 12/24/18   Lyndal Pulley, DO  clobetasol ointment (TEMOVATE) [id number] % Apply 1 application topically 2 (two) times daily. Patient not taking: Reported on 12/09/2019 09/17/17   Janith Lima, MD  ezetimibe (ZETIA) 10 MG tablet Take 1 tablet (10 mg total) by mouth daily. Patient not taking: Reported on 12/09/2019 09/26/17   [name], MD  ketoconazole (NIZORAL) 2 % cream APPLY TO AFFECTED AREA EVERY DAY Patient not taking: Reported on 12/09/2019 07/22/18   [name], MD  lidocaine (LIDODERM) 5 % APPLY 1 PATCH TO AFFECTED AREA FOR UP TO 12 HOURS . DO NOT USE MORE THAN ONE PATCH IN 24 HOURS. Patient not taking: Reported on 12/09/2019 07/13/19   [name], MD  sertraline (ZOLOFT) 100 MG tablet TAKE 2 TABLETS BY MOUTH AT BEDTIME Patient not taking: Reported on 12/09/2019 03/26/17   [name], MD    Physical Exam: Vitals:   12/09/19 1345 12/09/19 1415 12/09/19 1501 12/09/19 1545  BP: (!) 76/44 (!) 78/49  (!) 79/54  Pulse: 70 69 69 70  Resp: [name])  25 (!) 25 (!) 22 (!) 23  Temp:      TempSrc:      SpO2: 96% 95% 97% 96%  Weight:      Height:        Constitutional: NAD, calm, comfortable Vitals:   12/09/19 1345 12/09/19 1415 12/09/19 1501 12/09/19 1545  BP: (!) 76/44 (!) 78/49  (!) 79/54  Pulse: 70 69 69 70  Resp: (!) 25 (!) 25 (!) 22 (!) 23  Temp:      TempSrc:      SpO2: 96% 95% 97% 96%  Weight:      Height:       Eyes: PERRL, lids and conjunctivae normal ENMT: Mucous membranes are moist. Posterior pharynx clear of any exudate or lesions.Normal dentition.  Neck: normal, supple, no masses, no  thyromegaly Respiratory: clear to auscultation bilaterally, no wheezing, no crackles. Increased respiratory effort. No accessory muscle use.  Cardiovascular: Regular rate and rhythm, no murmurs / rubs / gallops. No extremity edema. 2+ pedal pulses. No carotid bruits.  Abdomen: no tenderness, no masses palpated. No hepatosplenomegaly. Bowel sounds positive.  Musculoskeletal: no clubbing / cyanosis. No joint deformity upper and lower extremities. Good ROM, no contractures. Normal muscle tone.  Skin: no rashes, lesions, ulcers. No induration Neurologic: CN 2-12 grossly intact. Sensation intact, DTR normal. Strength 5/5 in all 4.  Psychiatric: Normal judgment and insight. Alert and oriented x 3. Normal mood.    Labs on Admission: I have personally reviewed following labs and imaging studies  CBC: Recent Labs  Lab 12/09/19 1004  WBC 6.6  NEUTROABS 5.6  HGB 12.6*  HCT 36.1*  MCV 100.6*  PLT 123456*   Basic Metabolic Panel: Recent Labs  Lab 12/09/19 1004  NA 133*  K 3.5  CL 99  CO2 25  GLUCOSE 110*  BUN 27*  CREATININE 1.24  CALCIUM 8.5*   GFR: Estimated Creatinine Clearance: 73.9 mL/min (by C-G formula based on SCr of 1.24 mg/dL). Liver Function Tests: Recent Labs  Lab 12/09/19 1004  AST 26  ALT 30  ALKPHOS 40  BILITOT 0.6  PROT 5.3*  ALBUMIN 2.8*   No results for input(s): LIPASE, AMYLASE in the last 168 hours. No results for input(s): AMMONIA in the last 168 hours. Coagulation Profile: Recent Labs  Lab 12/09/19 1004  INR 1.1   Cardiac Enzymes: No results for input(s): CKTOTAL, CKMB, CKMBINDEX, TROPONINI in the last 168 hours. BNP (last 3 results) No results for input(s): PROBNP in the last 8760 hours. HbA1C: No results for input(s): HGBA1C in the last 72 hours. CBG: No results for input(s): GLUCAP in the last 168 hours. Lipid Profile: Recent Labs    12/09/19 1004  TRIG 56   Thyroid Function Tests: Recent Labs    12/09/19 1351  TSH 1.853   Anemia  Panel: Recent Labs    12/09/19 1004  FERRITIN 262   Urine analysis:    Component Value Date/Time   COLORURINE YELLOW 12/09/2019 [place] 12/09/2019 1251   LABSPEC 1.006 12/09/2019 1251   PHURINE 6.0 12/09/2019 1251   GLUCOSEU NEGATIVE 12/09/2019 1251   GLUCOSEU NEGATIVE 03/23/2019 1421   HGBUR NEGATIVE 12/09/2019 1251   BILIRUBINUR NEGATIVE 12/09/2019 1251   KETONESUR NEGATIVE 12/09/2019 1251   PROTEINUR NEGATIVE 12/09/2019 1251   UROBILINOGEN 0.2 03/23/2019 1421   NITRITE NEGATIVE 12/09/2019 1251   LEUKOCYTESUR NEGATIVE 12/09/2019 1251    Radiological Exams on Admission: CT Angio Chest PE W/Cm &/Or Wo Cm  Result Date:  12/09/2019 CLINICAL DATA:  Shortness of breath.  COVID-19 positive EXAM: CT ANGIOGRAPHY CHEST WITH CONTRAST TECHNIQUE: Multidetector CT imaging of the chest was performed using the standard protocol during bolus administration of intravenous contrast. Multiplanar CT image reconstructions and MIPs were obtained to evaluate the vascular anatomy. CONTRAST:  39mL OMNIPAQUE IOHEXOL 350 MG/ML SOLN COMPARISON:  Chest radiograph December 09, 2019 FINDINGS: Cardiovascular: There is no demonstrable pulmonary embolus. There is no thoracic aortic aneurysm. No dissection evident. The contrast bolus in the aorta is less than optimal for dissection assessment. There are foci of aortic atherosclerosis. Visualized great vessels appear normal. There are foci of coronary artery calcification. There is a pacemaker with lead tips attached to right atrium, right ventricle, and coronary sinus. There is no pericardial effusion or pericardial thickening. Mediastinum/Nodes: Thyroid appears normal. No thoracic adenopathy appreciable. No esophageal lesions evident. Lungs/Pleura: There is airspace opacity with relative consolidation in both lower lobes, primarily posteriorly in each lower lobe as well as in a portion of the superior segment right lower lobe. Also small area of  infiltrate noted in the inferior aspect of the posterior segment right upper lobe. No pleural effusions evident. Upper Abdomen: Visualized upper abdominal structures appear normal. Musculoskeletal: There is degenerative change in the thoracic spine. There is diffuse idiopathic skeletal hyperostosis in the lower thoracic region. No blastic or lytic bone lesions. No chest wall lesions. Pacemaker device is positioned anteriorly on the left. Review of the MIP images confirms the above findings. IMPRESSION: 1. No demonstrable pulmonary embolus. No thoracic aortic aneurysm. No dissection evident with contrast bolus noted to be less than optimal for dissection assessment. There is aortic atherosclerosis as well as foci of coronary artery calcification. 2. Pacemaker present with lead tips attached to right atrium, right ventricle, and coronary sinus. 3. Pneumonia with consolidation in both lower lobes, primarily posteriorly as well as in portions of the inferior, posterior segment right upper lobe and in the anterior aspect of the superior segment of the right lower lobe. 4.  No evident adenopathy. Aortic Atherosclerosis (ICD10-I70.0). Electronically Signed   By: Lowella Grip III M.D.   On: 12/09/2019 15:24   DG Chest Portable 2 Views  Result Date: 12/07/2019 CLINICAL DATA:  Dyspnea. Shortness of breath. EXAM: CHEST  2 VIEW PORTABLE COMPARISON:  01/14/2018 FINDINGS: Left-sided pacemaker in place. Unchanged hyperinflation and bronchial thickening. Unchanged mild cardiomegaly. Stable mediastinal contours. No pleural effusion, pulmonary edema, focal airspace disease or pneumothorax. Mild lingular scarring. No acute osseous abnormalities are seen. IMPRESSION: Stable hyperinflation and bronchial thickening.  No acute findings. Electronically Signed   By: Keith Rake M.D.   On: 12/07/2019 20:28   DG Chest Port 1 View  Result Date: 12/09/2019 CLINICAL DATA:  COVID positive. EXAM: PORTABLE CHEST 1 VIEW COMPARISON:   12/07/2019 FINDINGS: Cardiac enlargement. Left chest wall ICD is noted with leads in the right atrial appendage and right ventricle. No pleural effusion or edema. No airspace densities identified. The visualized osseous structures are unremarkable. IMPRESSION: 1. No acute findings. 2. Cardiac enlargement. Electronically Signed   By: Kerby Moors M.D.   On: 12/09/2019 11:56    EKG: Independently reviewed. Paced  Assessment/Plan Active Problems:   COVID-19 virus infection   COVID-19   Hypotension  Hypotension Patient told me his baseline blood pressure systolic lower [id number], volume status patient probably is borderline hypovolumic, x-ray showed no significant infiltrates or congestion, BNP both to his baseline.  Reviewed all these data with on-call cardiologist Dr. Ellyn Hack, and office records  show patient weight is at his baseline which about 107 kg. Cardiology recommend hold diuresis and Entresto, slow hydration with normal saline at 50 mL per lower for today. Lactic acid within normal limits CT angiogram negative for PE, given the results of CT chest showing bilateral peripheral pneumonia which is typical COVID-19 pneumonia presentation, and patient also had fever and other symptoms of active COVID-19 infection despite initial positive Covid test was about 2 weeks ago, decidet to treat COVID-19 pneumonia with remdesivir and steroid, this was explained to the patient in detail.  Patient voiced understanding and agreed. No indication of Inotrop or pressor for now Check TSH and Cortisol level  History of systolic CHF chronic As discussed above, impression is hypovolumic, will hold diuresis and Entresto Slow hydration for today.  Chronic A. Fib Rhythm paced Continue Eliquis  Chronic COPD No significant symptoms or signs of acute exacerbation. Treat Covid.  Morbid obesity Outpatient bariatric evaluation   DVT prophylaxis: Eliquis Code Status: Full code Family Communication: None at  bedside Disposition Plan: Once blood pressure stabilized, patient can be discharged home, likely in 1 to 2 days. Consults called: Dr. Ellyn Hack from cardiology Admission status: PCU   Lequita Halt MD [facility] [id number]    12/09/2019, 3:51 PM

## 2019-12-09 NOTE — Progress Notes (Signed)
Pharmacy Antibiotic Note  [name] is a 64 y.o. male admitted on 12/09/2019 with sepsis.  Pharmacy has been consulted for vancomycin and cefepime dosing. Pt is febrile with Tmax 103 and SCr is slightly elevated at 1.24. Lactic acid is WNL.   Plan: Vancomycin 2gm IV x 1 then 1500mg  IV Q24H Cefepime 2gm IV Q8H  F/u renal fxn, C&S, clinical status and peak/trough at SS    Temp (24hrs), Avg:100.7 F (38.2 C), Min:98.4 F (36.9 C), Max:103 F (39.4 C)  Recent Labs  Lab 12/09/19 1004  CREATININE 1.24  LATICACIDVEN 1.2    Estimated Creatinine Clearance: 73.9 mL/min (by C-G formula based on SCr of 1.24 mg/dL).    Allergies  Allergen Reactions  . Amiodarone Other (See Comments)    hyperthyroidism  . Statins Other (See Comments)    myalgia  . Tape Other (See Comments)    Skin Tears Use Paper Tape Only    Antimicrobials this admission: Vanc 2/24>> Cefepime 2/24>>  Dose adjustments this admission: N/A  Microbiology results: Pending  Thank you for allowing pharmacy to be a part of this patient's care.  Edom Schmuhl, Rande Lawman 12/09/2019 10:32 AM

## 2019-12-09 NOTE — Telephone Encounter (Signed)
   Patient called the after hours line with complaints multiple complaints. He tested positive for COVID-19 11/27/19. He has had coughing and SOB since that time. Seen by PCP 12/07/19 and diagnosed with bronchitis. He has continued to have progressive SOB. Notes hypotension with BP in the 80s/60s. Also having what sounds like rigors this morning. Has not checked his temperature. Advised to activate EMS to bring him to the ED. He reports Adult nurse EMS ride. Plans to arrive via private vehicle for further evaluation. ED notified of recommendations and +COVID-19 status.   Abigail Butts, PA-C 12/09/19; 8:06 AM

## 2019-12-09 NOTE — ED Notes (Signed)
Daughter Melody called and would like an update [phone]

## 2019-12-09 NOTE — ED Notes (Signed)
Per pharmacy, it is ok to give the remdesivir even though the pt was first swabbed 14 days ago and received a positive result from that swab.

## 2019-12-09 NOTE — ED Notes (Signed)
Attempted to call report

## 2019-12-10 ENCOUNTER — Other Ambulatory Visit: Payer: Self-pay

## 2019-12-10 DIAGNOSIS — I5022 Chronic systolic (congestive) heart failure: Secondary | ICD-10-CM

## 2019-12-10 DIAGNOSIS — I10 Essential (primary) hypertension: Secondary | ICD-10-CM

## 2019-12-10 LAB — CBC WITH DIFFERENTIAL/PLATELET
Abs Immature Granulocytes: 0.03 10*3/uL (ref 0.00–0.07)
Basophils Absolute: 0 10*3/uL (ref 0.0–0.1)
Basophils Relative: 0 %
Eosinophils Absolute: 0 10*3/uL (ref 0.0–0.5)
Eosinophils Relative: 0 %
HCT: 38.2 % — ABNORMAL LOW (ref 39.0–52.0)
Hemoglobin: 13.1 g/dL (ref 13.0–17.0)
Immature Granulocytes: 1 %
Lymphocytes Relative: 12 %
Lymphs Abs: 0.8 10*3/uL (ref 0.7–4.0)
MCH: 34.6 pg — ABNORMAL HIGH (ref 26.0–34.0)
MCHC: 34.3 g/dL (ref 30.0–36.0)
MCV: 100.8 fL — ABNORMAL HIGH (ref 80.0–100.0)
Monocytes Absolute: 0.6 10*3/uL (ref 0.1–1.0)
Monocytes Relative: 10 %
Neutro Abs: 4.9 10*3/uL (ref 1.7–7.7)
Neutrophils Relative %: 77 %
Platelets: 126 10*3/uL — ABNORMAL LOW (ref 150–400)
RBC: 3.79 MIL/uL — ABNORMAL LOW (ref 4.22–5.81)
RDW: 13.1 % (ref 11.5–15.5)
WBC: 6.3 10*3/uL (ref 4.0–10.5)
nRBC: 0 % (ref 0.0–0.2)

## 2019-12-10 LAB — COMPREHENSIVE METABOLIC PANEL
ALT: 28 U/L (ref 0–44)
AST: 26 U/L (ref 15–41)
Albumin: 2.8 g/dL — ABNORMAL LOW (ref 3.5–5.0)
Alkaline Phosphatase: 36 U/L — ABNORMAL LOW (ref 38–126)
Anion gap: 9 (ref 5–15)
BUN: 18 mg/dL (ref 8–23)
CO2: 25 mmol/L (ref 22–32)
Calcium: 8.3 mg/dL — ABNORMAL LOW (ref 8.9–10.3)
Chloride: 106 mmol/L (ref 98–111)
Creatinine, Ser: 0.91 mg/dL (ref 0.61–1.24)
GFR calc Af Amer: >60 mL/min (ref >60)
GFR calc non Af Amer: >60 mL/min (ref >60)
Glucose, Bld: 99 mg/dL (ref 70–99)
Potassium: 3.5 mmol/L (ref 3.5–5.1)
Sodium: 140 mmol/L (ref 135–145)
Total Bilirubin: 0.5 mg/dL (ref 0.3–1.2)
Total Protein: 5.5 g/dL — ABNORMAL LOW (ref 6.5–8.1)

## 2019-12-10 LAB — GLUCOSE, CAPILLARY
Glucose-Capillary: 86 mg/dL (ref 70–99)
Glucose-Capillary: 97 mg/dL (ref 70–99)
Glucose-Capillary: 140 mg/dL — ABNORMAL HIGH (ref 70–99)
Glucose-Capillary: 74 mg/dL (ref 70–99)

## 2019-12-10 LAB — URINE CULTURE: Culture: NO GROWTH

## 2019-12-10 LAB — C-REACTIVE PROTEIN: CRP: 7.8 mg/dL — ABNORMAL HIGH (ref <1.0)

## 2019-12-10 LAB — MAGNESIUM: Magnesium: 1.7 mg/dL (ref 1.7–2.4)

## 2019-12-10 LAB — CORTISOL-AM, BLOOD: Cortisol - AM: 2 ug/dL — ABNORMAL LOW (ref 6.7–22.6)

## 2019-12-10 LAB — PHOSPHORUS: Phosphorus: 2.2 mg/dL — ABNORMAL LOW (ref 2.5–4.6)

## 2019-12-10 LAB — D-DIMER, QUANTITATIVE: D-Dimer, Quant: 6.67 ug/mL-FEU — ABNORMAL HIGH (ref 0.00–0.50)

## 2019-12-10 LAB — FERRITIN: Ferritin: 226 ng/mL (ref 24–336)

## 2019-12-10 MED ORDER — MAGNESIUM SULFATE 2 GM/50ML IV SOLN
2.0000 g | Freq: Once | INTRAVENOUS | Status: AC
  Administered 2019-12-10: 2 g via INTRAVENOUS
  Filled 2019-12-10: qty 50

## 2019-12-10 MED ORDER — METHYLPREDNISOLONE SODIUM SUCC 40 MG IJ SOLR
40.0000 mg | Freq: Two times a day (BID) | INTRAMUSCULAR | Status: DC
  Administered 2019-12-10 – 2019-12-11 (×3): 40 mg via INTRAVENOUS
  Filled 2019-12-10 (×3): qty 1

## 2019-12-10 MED ORDER — ALBUTEROL SULFATE HFA 108 (90 BASE) MCG/ACT IN AERS
2.0000 | INHALATION_SPRAY | Freq: Four times a day (QID) | RESPIRATORY_TRACT | Status: DC | PRN
  Administered 2019-12-10 – 2019-12-11 (×2): 2 via RESPIRATORY_TRACT
  Filled 2019-12-10: qty 6.7

## 2019-12-10 MED ORDER — WHITE PETROLATUM EX OINT
TOPICAL_OINTMENT | CUTANEOUS | Status: AC
  Filled 2019-12-10: qty 28.35

## 2019-12-10 MED ORDER — POTASSIUM CHLORIDE CRYS ER 20 MEQ PO TBCR
20.0000 meq | EXTENDED_RELEASE_TABLET | Freq: Once | ORAL | Status: AC
  Administered 2019-12-10: 13:00:00 20 meq via ORAL
  Filled 2019-12-10: qty 1

## 2019-12-10 NOTE — Progress Notes (Signed)
Physical Therapy Evaluation     Clinical Impression/ PT notes: Pt is pleasant and eager to go home. Pt is not on any supplemental O2 at this time and vitals WNL. Pt came from home. He is fully independent at home, drives, and helps assist and take care of his girlfriend. Pt lives with his daughter. Pt is able to ambulate 200' with RW with min guard for improved steadiness and line management. Pt becomes fatigued while ambulating however demonstrates mild to no SOB. Pt presents with generalized weakness, decreased endurance, functional mobility, and strength at this time. Recommend home with HH. Pt reports his daughter can help him as needed and is home most of the time. PT will continue to follow acutely.      12/10/19 0900  PT Visit Information  Last PT Received On 12/10/19  Assistance Needed +1  History of Present Illness Pt is a 64 y.o. male with PMH significant of systolic CHF, chronic A. fib on Eliquis, COPD on as needed home oxygen, anxiety/ depression, presented with increasing cough and short of breath.  His symptoms started about 2 weeks ago, initially was dry cough, diagnosed with Covid on 2\12. Admitted to ED on 2/24 with sepsis and also has a recent dx of bronchitis. Presenting with borderline low blood pressure.   Precautions  Precautions Other (comment) (airborne)  Restrictions  Weight Bearing Restrictions No  Home Living  Family/patient expects to be discharged to: Private residence  Living Arrangements Children  Available Help at Discharge Family  Type of [place] to enter  Entrance Stairs-Number of Steps 5  Entrance Stairs-Rails Right  [place] One level  Bathroom Shower/Tub Tub only  Research officer, trade [employer] - single point;Walker - 2 wheels  Additional Comments Pt reports he normally does not wear O2 at home, just after recent COVID dx has he been wearing O2  Prior Function  Level of Independence Independent   Comments Cooks and cleans independently. Drives. Helps assists his girlfriend who his handicap.   Communication  Communication No difficulties  Pain Assessment  Pain Assessment No/denies pain  Cognition  Arousal/Alertness Awake/alert  Behavior During Therapy WFL for tasks assessed/performed  Overall Cognitive Status Within Functional Limits for tasks assessed  Upper Extremity Assessment  Upper Extremity Assessment Overall WFL for tasks assessed  Lower Extremity Assessment  Lower Extremity Assessment Generalized weakness  Bed Mobility  Overal bed mobility Modified Independent  General bed mobility comments HOB elevated, use of bed rails   Transfers  Overall transfer level Needs assistance  Equipment used Rolling walker (2 wheeled)  Transfers Sit to/from Stand  Sit to Stand Min guard  General transfer comment Min guard for steadiness and line management   Ambulation/Gait  Ambulation/Gait assistance Min guard  Gait Distance (Feet) 200 Feet  Assistive device Rolling walker (2 wheeled)  Gait Pattern/deviations Decreased stride length  General Gait Details Decreased bilateral heel strike, decreased cadence and step length  Gait velocity mildly decreased  Gait velocity interpretation >2.62 ft/sec, indicative of community ambulatory  Balance  Overall balance assessment Needs assistance  Sitting-balance support No upper extremity supported;Feet supported  Sitting balance-Leahy Scale Good  Sitting balance - Comments able to sit EOB without UE support without difficulty  Standing balance support Bilateral upper extremity supported;During functional activity  Standing balance-Leahy Scale Fair  Standing balance comment Pt is able to stand still without UE support for gown managment without LOB, during ambulation he benefits from RW use  for safety and steadiness  General Comments  General comments (skin integrity, edema, etc.) Pt's BP dropped with initial stand (90/63 mmHg) however rose to  WNL quickly and stayed WNL throughout remainder of tx. Pt's HR bounces up and down into the 120s. RN made aware. Pt is breathing on room air. SpO2 dropped to 89% while ambulating briefly however with pursed lip breathing cues it quickly raises back to 92%+.   Exercises  Exercises Other exercises  Other Exercises  Other Exercises pursed lip breathing  PT - End of Session  Equipment Utilized During Treatment Gait belt  Activity Tolerance Patient tolerated treatment well  Patient left in chair;with call bell/phone within reach;with chair alarm set  Nurse Communication Mobility status;Other (comment) (vitals)  PT Assessment  PT Recommendation/Assessment Patient needs continued PT services  PT Visit Diagnosis Unsteadiness on feet (R26.81);Other abnormalities of gait and mobility (R26.89);Muscle weakness (generalized) (M62.81)  PT Problem List Decreased strength;Decreased activity tolerance;Decreased balance;Decreased mobility;Decreased coordination;Decreased knowledge of use of DME;Decreased safety awareness  PT Plan  PT Frequency (ACUTE ONLY) Min 3X/week  PT Treatment/Interventions (ACUTE ONLY) DME instruction;Gait training;Stair training;Functional mobility training;Therapeutic activities;Therapeutic exercise;Balance training;Neuromuscular re-education;Patient/family education  AM-PAC PT "6 Clicks" Mobility Outcome Measure (Version 2)  Help needed turning from your back to your side while in a flat bed without using bedrails? 3  Help needed moving from lying on your back to sitting on the side of a flat bed without using bedrails? 3  Help needed moving to and from a bed to a chair (including a wheelchair)? 3  Help needed standing up from a chair using your arms (e.g., wheelchair or bedside chair)? 3  Help needed to walk in hospital room? 3  Help needed climbing 3-5 steps with a railing?  2  6 Click Score 17  Consider Recommendation of Discharge To: Home with [facility]  PT Recommendation  Follow Up  Recommendations Home health PT;Supervision - Intermittent  PT equipment None recommended by PT (pt has a RW at home)  Individuals Consulted  Consulted and Agree with Results and Recommendations Patient  Acute Rehab PT Goals  Patient Stated Goal to go home  PT Goal Formulation With patient  Time For Goal Achievement 12/24/19  Potential to Achieve Goals Good  PT Time Calculation  PT Start Time (ACUTE ONLY) [id number]  PT Stop Time (ACUTE ONLY) 1005  PT Time Calculation (min) (ACUTE ONLY) 47 min  PT General Charges  $$ ACUTE PT VISIT 1 Visit  PT Evaluation  $PT Eval Moderate Complexity 1 Mod  PT Treatments  $Therapeutic Activity 23-37 mins

## 2019-12-10 NOTE — Progress Notes (Signed)
PROGRESS NOTE                                                                                                                                                                                                             Patient Demographics:    [name], is a 64 y.o. male, DOB - [birth year], [id number]  Outpatient Primary MD for the patient is [name], MD   Admit date - 12/09/2019   LOS - 1  Chief Complaint  Patient presents with  . Hypotension       Brief Narrative: Patient is a 64 y.o. male with PMHx of chronic systolic heart failure, atrial fibrillation on Eliquis, COPD on prn home O2, anxiety/depression-who was diagnosed with COVID-19 on 2/12-presented to the ED with cough and shortness of breath-found to have COVID-19 pneumonia with hypotension likely secondary to hypovolemia.  Patient was subsequently admitted to the hospitalist service.  See below for further details.   Subjective:    [name] today feels better.  Blood pressure stable this morning.  He does not have shortness of breath this morning.   Assessment  & Plan :   Covid 19 Viral pneumonia: Overall improved-on only 1 L of oxygen this morning-remains on steroids and remdesivir.  Do not think patient has bacterial pneumonia (procalcitonin negative)-hence will discontinue vancomycin and cefepime.  Attempt to titrate off oxygen.  Follow inflammatory markers and clinical trajectory.  Fever: afebrile  O2 requirements:  SpO2: 93 % O2 Flow Rate (L/min): 1 L/min   COVID-19 Labs: Recent Labs    12/09/19 1004 12/10/19 0747  DDIMER 12.38* 6.67*  FERRITIN 262 226  LDH 171  --   CRP 9.4* 7.8*       Component Value Date/Time   BNP 129.6 (H) 12/09/2019 1004    Recent Labs  Lab 12/09/19 1004  PROCALCITON <0.10    Lab Results  Component Value Date   SARSCOV2NAA POSITIVE (A) 12/09/2019     COVID-19 Medications: Steroids: 2/24>>  Remdesivir: 2/24>>  Antibiotics: Vancomycin: 2/24>> 2/25 Cefepime: 2/24>> 2/25  Prone/Incentive Spirometry: encouraged  incentive spirometry use 3-4/hour.  DVT Prophylaxis  : Eliquis  Elevated D-dimer: Probably secondary to COVID-19-now downtrending-CTA chest negative for PE.  Already on Eliquis-doubt further work-up required.  Hypotension: Suspected to be from hypovolemia (patient had diarrhea this past Sunday/Monday and was also  recently started on Entresto)-resolved with IVF-volume status stable-stop IVF.  Continue to hold antihypertensives.  Hypomagnesemia: Replete and recheck.  Thrombocytopenia: Appears to be mild-likely secondary to Covid-follow.  Chronic systolic heart failure (EF 35-40% by TTE on 11/06/2019)-s/p AICD placement: Euvolemic on exam-diuretics/beta-blocker/Entresto on hold given hypotension (now improving).  Chronic A. fib: Rate controlled-on digoxin-beta-blocker on hold-continue Eliquis  HLD: Continue statin  COPD: Stable-continue bronchodilators  Spongiotic dermatitis/rheumatoid arthritis: Maintained on methotrexate as outpatient   Obesity: Estimated body mass index is 34.03 kg/m as calculated from the following:   Height as of this encounter: 5\' 10"  (1.778 m).   Weight as of this encounter: 107.6 kg.   RN pressure injury documentation:   Consults  :  None  Procedures  :  None  ABG:    Component Value Date/Time   HCO3 27.9 01/28/2017 1222   HCO3 29.1 (H) 01/28/2017 1222   TCO2 29 01/28/2017 1222   TCO2 31 01/28/2017 1222   ACIDBASEDEF 1.0 03/21/2016 1101   O2SAT 70.0 01/28/2017 1222   O2SAT 71.0 01/28/2017 1222    Vent Settings: N/A  Condition - Guarded-  Family Communication  : Patient prefers to update family himself-I have asked him to let me know if his family has additional questions.  Code Status :  Full Code  Diet :  Diet Order            Diet Heart Room service appropriate? Yes; Fluid consistency: Thin  Diet effective now                Disposition Plan  :  Remain hospitalized-probably home in the next day or so depending on clinical trajectory  Barriers to discharge: Complete 5 days of IV Remdesivir/resolving hypotension  Antimicorbials  :    Anti-infectives (From admission, onward)   Start     Dose/Rate Route Frequency Ordered Stop   12/10/19 1200  vancomycin (VANCOREADY) IVPB 1500 mg/300 mL     1,500 mg 150 mL/hr over 120 Minutes Intravenous Every 24 hours 12/09/19 1219     12/10/19 1000  remdesivir 100 mg in sodium chloride 0.9 % 100 mL IVPB     100 mg 200 mL/hr over 30 Minutes Intravenous Daily 12/09/19 1346 12/14/19 0959   12/09/19 2000  ceFEPIme (MAXIPIME) 2 g in sodium chloride 0.9 % 100 mL IVPB     2 g 200 mL/hr over 30 Minutes Intravenous Every 8 hours 12/09/19 1219     12/09/19 1531  valACYclovir (VALTREX) tablet 1,000 mg     1,000 mg Oral 3 times daily PRN 12/09/19 1345     12/09/19 1430  remdesivir 200 mg in sodium chloride 0.9% 250 mL IVPB     200 mg 580 mL/hr over 30 Minutes Intravenous Once 12/09/19 1346 12/09/19 1618   12/09/19 1015  ceFEPIme (MAXIPIME) 2 g in sodium chloride 0.9 % 100 mL IVPB     2 g 200 mL/hr over 30 Minutes Intravenous  Once 12/09/19 1009 12/09/19 1133   12/09/19 1015  vancomycin (VANCOCIN) IVPB 1000 mg/200 mL premix  Status:  Discontinued     1,000 mg 200 mL/hr over 60 Minutes Intravenous  Once 12/09/19 1009 12/09/19 1010   12/09/19 1015  vancomycin (VANCOREADY) IVPB 2000 mg/400 mL     2,000 mg 200 mL/hr over 120 Minutes Intravenous  Once 12/09/19 1010 12/09/19 1440      Inpatient Medications  Scheduled Meds: . albuterol  2 puff Inhalation Q6H  . ALPRAZolam  1 mg Oral QHS  .  apixaban  5 mg Oral BID  . carisoprodol  350 mg Oral TID  . digoxin  125 mcg Oral Daily  . doxepin  20 mg Oral QHS  . fluticasone furoate-vilanterol  1 puff Inhalation Daily  . insulin aspart  0-9 Units Subcutaneous TID WC  . methylPREDNISolone (SOLU-MEDROL) injection  40 mg  Intravenous Q12H  . pantoprazole  40 mg Oral Daily  . rosuvastatin  5 mg Oral Daily  . venlafaxine XR  37.5 mg Oral Q breakfast  . zolpidem  5 mg Oral QHS   Continuous Infusions: . sodium chloride Stopped (12/10/19 1021)  . ceFEPime (MAXIPIME) IV Stopped (12/10/19 0449)  . remdesivir 100 mg in NS 100 mL    . vancomycin     PRN Meds:.acetaminophen, benzonatate, calcium carbonate, fluticasone, senna-docusate, traMADol, traZODone, valACYclovir   Time Spent in minutes  25  See all Orders from today for further details   Oren Binet M.D on 12/10/2019 at 10:39 AM  To page go to [email or web address] - use universal password  [facility] -  Office  [phone]    Objective:   Vitals:   12/10/19 0000 12/10/19 0411 12/10/19 0821 12/10/19 0836  BP: (!) 102/59 117/71 (!) 99/49   Pulse: 71 73 70 69  Resp: (!) 21 11  16   Temp: 97.8 F (36.6 C) 98.1 F (36.7 C) 98.9 F (37.2 C)   TempSrc: Oral Oral Oral   SpO2: 95% 95% 100% 93%  Weight:  107.6 kg    Height:  5\' 10"  (1.778 m)      Wt Readings from Last 3 Encounters:  12/10/19 107.6 kg  12/07/19 107.5 kg  11/06/19 106.9 kg     Intake/Output Summary (Last 24 hours) at 12/10/2019 1039 Last data filed at 12/10/2019 0842 Gross per 24 hour  Intake 3431.89 ml  Output 1980 ml  Net 1451.89 ml     Physical Exam Gen Exam:Alert awake-not in any distress HEENT:atraumatic, normocephalic Chest: B/L clear to auscultation anteriorly CVS:S1S2 regular Abdomen:soft non tender, non distended Extremities:no edema Neurology: Non focal Skin: no rash   Data Review:    CBC Recent Labs  Lab 12/09/19 1004 12/10/19 0747  WBC 6.6 6.3  HGB 12.6* 13.1  HCT 36.1* 38.2*  PLT 104* 126*  MCV 100.6* 100.8*  MCH 35.1* 34.6*  MCHC 34.9 34.3  RDW 12.9 13.1  LYMPHSABS 0.4* 0.8  MONOABS 0.5 0.6  EOSABS 0.0 0.0  BASOSABS 0.0 0.0    Chemistries  Recent Labs  Lab 12/09/19 1004 12/10/19 0747  NA 133* 140  K 3.5 3.5  CL 99 106   CO2 25 25  GLUCOSE 110* 99  BUN 27* 18  CREATININE 1.24 0.91  CALCIUM 8.5* 8.3*  MG  --  1.7  AST 26 26  ALT 30 28  ALKPHOS 40 36*  BILITOT 0.6 0.5   ------------------------------------------------------------------------------------------------------------------ Recent Labs    12/09/19 1004  TRIG 56    Lab Results  Component Value Date   HGBA1C 5.2 09/23/2019   ------------------------------------------------------------------------------------------------------------------ Recent Labs    12/09/19 1351  TSH 1.853   ------------------------------------------------------------------------------------------------------------------ Recent Labs    12/09/19 1004 12/10/19 0747  FERRITIN 262 226    Coagulation profile Recent Labs  Lab 12/09/19 1004  INR 1.1    Recent Labs    12/09/19 1004 12/10/19 0747  DDIMER 12.38* 6.67*    Cardiac Enzymes No results for input(s): CKMB, TROPONINI, MYOGLOBIN in the last 168 hours.  Invalid input(s): CK ------------------------------------------------------------------------------------------------------------------    Component  Value Date/Time   BNP 129.6 (H) 12/09/2019 1004    Micro Results Recent Results (from the past 240 hour(s))  Blood Culture (routine x 2)     Status: None (Preliminary result)   Collection Time: 12/09/19 11:09 AM   Specimen: BLOOD  Result Value Ref Range Status   Specimen Description BLOOD SITE NOT SPECIFIED  Final   Special Requests   Final    BOTTLES DRAWN AEROBIC AND ANAEROBIC Blood Culture adequate volume   Culture   Final    NO GROWTH < 12 HOURS Performed at [facility], [place] [address], [place], [place] [address]    Report Status PENDING  Incomplete  Blood Culture (routine x 2)     Status: None (Preliminary result)   Collection Time: 12/09/19 11:22 AM   Specimen: BLOOD  Result Value Ref Range Status   Specimen Description BLOOD SITE NOT SPECIFIED  Final   Special Requests    Final    BOTTLES DRAWN AEROBIC AND ANAEROBIC Blood Culture adequate volume   Culture   Final    NO GROWTH < 12 HOURS Performed at [facility], [place] [address], [place], [place] [address]    Report Status PENDING  Incomplete  Respiratory Panel by RT PCR (Flu A&B, Covid) - Nasopharyngeal Swab     Status: Abnormal   Collection Time: 12/09/19 11:35 AM   Specimen: Nasopharyngeal Swab  Result Value Ref Range Status   SARS Coronavirus 2 by RT PCR POSITIVE (A) NEGATIVE Final    Comment: RESULT CALLED TO, READ BACK BY AND VERIFIED WITH: L. Bishop RN 14:25 12/09/19 ([email or web address]) (NOTE) SARS-CoV-2 target nucleic acids are DETECTED. SARS-CoV-2 RNA is generally detectable in upper respiratory specimens  during the acute phase of infection. Positive results are indicative of the presence of the identified virus, but do not rule out bacterial infection or co-infection with other pathogens not detected by the test. Clinical correlation with patient history and other diagnostic information is necessary to determine patient infection status. The expected result is Negative. Fact Sheet for Patients:  PinkCheek.be Fact Sheet for Healthcare Providers: GravelBags.it This test is not yet approved or cleared by the [place] FDA and  has been authorized for detection and/or diagnosis of SARS-CoV-2 by FDA under an Emergency Use Authorization (EUA).  This EUA will remain in effect (meaning this test can be used)  for the duration of  the COVID-19 declaration under Section 564(b)(1) of the Act, 21 U.S.C. section 360bbb-3(b)(1), unless the authorization is terminated or revoked sooner.    Influenza A by PCR NEGATIVE NEGATIVE Final   Influenza B by PCR NEGATIVE NEGATIVE Final    Comment: (NOTE) The Xpert Xpress SARS-CoV-2/FLU/RSV assay is intended as an aid in  the diagnosis of influenza from Nasopharyngeal swab specimens and  should not be  used as a sole basis for treatment. Nasal washings and  aspirates are unacceptable for Xpert Xpress SARS-CoV-2/FLU/RSV  testing. Fact Sheet for Patients: PinkCheek.be Fact Sheet for Healthcare Providers: GravelBags.it This test is not yet approved or cleared by the [place] FDA and  has been authorized for detection and/or diagnosis of SARS-CoV-2 by  FDA under an Emergency Use Authorization (EUA). This EUA will remain  in effect (meaning this test can be used) for the duration of the  Covid-19 declaration under Section 564(b)(1) of the Act, 21  U.S.C. section 360bbb-3(b)(1), unless the authorization is  terminated or revoked. Performed at [facility], [place],  [place] [address]   Urine culture     Status: None   Collection Time: 12/09/19 12:51 PM   Specimen: Urine, Random  Result Value Ref Range Status   Specimen Description URINE, RANDOM  Final   Special Requests NONE  Final   Culture   Final    NO GROWTH Performed at [facility], [place] [address], [place], [place] [address]    Report Status 12/10/2019 FINAL  Final    Radiology Reports CT Angio Chest PE W/Cm &/Or Wo Cm  Result Date: 12/09/2019 CLINICAL DATA:  Shortness of breath.  COVID-19 positive EXAM: CT ANGIOGRAPHY CHEST WITH CONTRAST TECHNIQUE: Multidetector CT imaging of the chest was performed using the standard protocol during bolus administration of intravenous contrast. Multiplanar CT image reconstructions and MIPs were obtained to evaluate the vascular anatomy. CONTRAST:  37mL OMNIPAQUE IOHEXOL 350 MG/ML SOLN COMPARISON:  Chest radiograph December 09, 2019 FINDINGS: Cardiovascular: There is no demonstrable pulmonary embolus. There is no thoracic aortic aneurysm. No dissection evident. The contrast bolus in the aorta is less than optimal for dissection assessment. There are foci of aortic atherosclerosis. Visualized great vessels  appear normal. There are foci of coronary artery calcification. There is a pacemaker with lead tips attached to right atrium, right ventricle, and coronary sinus. There is no pericardial effusion or pericardial thickening. Mediastinum/Nodes: Thyroid appears normal. No thoracic adenopathy appreciable. No esophageal lesions evident. Lungs/Pleura: There is airspace opacity with relative consolidation in both lower lobes, primarily posteriorly in each lower lobe as well as in a portion of the superior segment right lower lobe. Also small area of infiltrate noted in the inferior aspect of the posterior segment right upper lobe. No pleural effusions evident. Upper Abdomen: Visualized upper abdominal structures appear normal. Musculoskeletal: There is degenerative change in the thoracic spine. There is diffuse idiopathic skeletal hyperostosis in the lower thoracic region. No blastic or lytic bone lesions. No chest wall lesions. Pacemaker device is positioned anteriorly on the left. Review of the MIP images confirms the above findings. IMPRESSION: 1. No demonstrable pulmonary embolus. No thoracic aortic aneurysm. No dissection evident with contrast bolus noted to be less than optimal for dissection assessment. There is aortic atherosclerosis as well as foci of coronary artery calcification. 2. Pacemaker present with lead tips attached to right atrium, right ventricle, and coronary sinus. 3. Pneumonia with consolidation in both lower lobes, primarily posteriorly as well as in portions of the inferior, posterior segment right upper lobe and in the anterior aspect of the superior segment of the right lower lobe. 4.  No evident adenopathy. Aortic Atherosclerosis (ICD10-I70.0). Electronically Signed   By: Lowella Grip III M.D.   On: 12/09/2019 15:24   DG Chest Portable 2 Views  Result Date: 12/07/2019 CLINICAL DATA:  Dyspnea. Shortness of breath. EXAM: CHEST  2 VIEW PORTABLE COMPARISON:  01/14/2018 FINDINGS: Left-sided  pacemaker in place. Unchanged hyperinflation and bronchial thickening. Unchanged mild cardiomegaly. Stable mediastinal contours. No pleural effusion, pulmonary edema, focal airspace disease or pneumothorax. Mild lingular scarring. No acute osseous abnormalities are seen. IMPRESSION: Stable hyperinflation and bronchial thickening.  No acute findings. Electronically Signed   By: Keith Rake M.D.   On: 12/07/2019 20:28   DG Chest Port 1 View  Result Date: 12/09/2019 CLINICAL DATA:  COVID positive. EXAM: PORTABLE CHEST 1 VIEW COMPARISON:  12/07/2019 FINDINGS: Cardiac enlargement. Left chest wall ICD is noted with leads in the right atrial appendage and right ventricle. No pleural effusion or edema. No airspace densities identified.  The visualized osseous structures are unremarkable. IMPRESSION: 1. No acute findings. 2. Cardiac enlargement. Electronically Signed   By: Kerby Moors M.D.   On: 12/09/2019 11:56

## 2019-12-11 LAB — COMPREHENSIVE METABOLIC PANEL
ALT: 30 U/L (ref 0–44)
AST: 25 U/L (ref 15–41)
Albumin: 2.6 g/dL — ABNORMAL LOW (ref 3.5–5.0)
Alkaline Phosphatase: 35 U/L — ABNORMAL LOW (ref 38–126)
Anion gap: 9 (ref 5–15)
BUN: 17 mg/dL (ref 8–23)
CO2: 23 mmol/L (ref 22–32)
Calcium: 8.7 mg/dL — ABNORMAL LOW (ref 8.9–10.3)
Chloride: 107 mmol/L (ref 98–111)
Creatinine, Ser: 0.84 mg/dL (ref 0.61–1.24)
GFR calc Af Amer: >60 mL/min (ref >60)
GFR calc non Af Amer: >60 mL/min (ref >60)
Glucose, Bld: 127 mg/dL — ABNORMAL HIGH (ref 70–99)
Potassium: 4 mmol/L (ref 3.5–5.1)
Sodium: 139 mmol/L (ref 135–145)
Total Bilirubin: 0.5 mg/dL (ref 0.3–1.2)
Total Protein: 5.1 g/dL — ABNORMAL LOW (ref 6.5–8.1)

## 2019-12-11 LAB — CBC WITH DIFFERENTIAL/PLATELET
Abs Immature Granulocytes: 0.05 10*3/uL (ref 0.00–0.07)
Basophils Absolute: 0 10*3/uL (ref 0.0–0.1)
Basophils Relative: 0 %
Eosinophils Absolute: 0 10*3/uL (ref 0.0–0.5)
Eosinophils Relative: 0 %
HCT: 36.3 % — ABNORMAL LOW (ref 39.0–52.0)
Hemoglobin: 12.4 g/dL — ABNORMAL LOW (ref 13.0–17.0)
Immature Granulocytes: 1 %
Lymphocytes Relative: 12 %
Lymphs Abs: 0.6 10*3/uL — ABNORMAL LOW (ref 0.7–4.0)
MCH: 34.5 pg — ABNORMAL HIGH (ref 26.0–34.0)
MCHC: 34.2 g/dL (ref 30.0–36.0)
MCV: 101.1 fL — ABNORMAL HIGH (ref 80.0–100.0)
Monocytes Absolute: 0.5 10*3/uL (ref 0.1–1.0)
Monocytes Relative: 9 %
Neutro Abs: 4 10*3/uL (ref 1.7–7.7)
Neutrophils Relative %: 78 %
Platelets: 132 10*3/uL — ABNORMAL LOW (ref 150–400)
RBC: 3.59 MIL/uL — ABNORMAL LOW (ref 4.22–5.81)
RDW: 13.2 % (ref 11.5–15.5)
WBC: 5.2 10*3/uL (ref 4.0–10.5)
nRBC: 0 % (ref 0.0–0.2)

## 2019-12-11 LAB — D-DIMER, QUANTITATIVE: D-Dimer, Quant: 3.48 ug/mL-FEU — ABNORMAL HIGH (ref 0.00–0.50)

## 2019-12-11 LAB — FERRITIN: Ferritin: 282 ng/mL (ref 24–336)

## 2019-12-11 LAB — GLUCOSE, CAPILLARY
Glucose-Capillary: 91 mg/dL (ref 70–99)
Glucose-Capillary: 85 mg/dL (ref 70–99)

## 2019-12-11 LAB — MAGNESIUM: Magnesium: 2.1 mg/dL (ref 1.7–2.4)

## 2019-12-11 LAB — C-REACTIVE PROTEIN: CRP: 4.9 mg/dL — ABNORMAL HIGH (ref <1.0)

## 2019-12-11 MED ORDER — CARVEDILOL 12.5 MG PO TABS: 12.5000 mg | ORAL_TABLET | Freq: Two times a day (BID) | ORAL | Status: DC

## 2019-12-11 MED ORDER — SPIRONOLACTONE 25 MG PO TABS
25.0000 mg | ORAL_TABLET | Freq: Every day | ORAL | Status: DC
  Administered 2019-12-11: 25 mg via ORAL
  Filled 2019-12-11: qty 1

## 2019-12-11 MED ORDER — PREDNISONE 10 MG PO TABS: ORAL_TABLET | ORAL | 0 refills | Status: DC

## 2019-12-11 MED ORDER — TORSEMIDE 20 MG PO TABS
40.0000 mg | ORAL_TABLET | Freq: Two times a day (BID) | ORAL | Status: DC
  Administered 2019-12-11: 40 mg via ORAL
  Filled 2019-12-11: qty 2

## 2019-12-11 NOTE — Plan of Care (Signed)
Pt A&Ox4. VSS, SpO2 >88% on RA. C/o moderate shoulder pain during shift, given PRN tramadol w/ positive effect. Ambulating to bathroom independently at baseline w/ no issue  Reviewed d/c paperwork w, all questions answered at this time. Pt stated understanding, informed to go to infusion center for remaining remdesevir treatment. PIV removed. Pt left unit w./ all belongings  Tomie [place]    Problem: Education: Goal: Knowledge of General Education information will improve Description: Including pain rating scale, medication(s)/side effects and non-pharmacologic comfort measures Outcome: Adequate for Discharge   Problem: Health Behavior/Discharge Planning: Goal: Ability to manage health-related needs will improve Outcome: Adequate for Discharge   Problem: Clinical Measurements: Goal: Ability to maintain clinical measurements within normal limits will improve Outcome: Adequate for Discharge Goal: Will remain free from infection Outcome: Adequate for Discharge Goal: Diagnostic test results will improve Outcome: Adequate for Discharge Goal: Respiratory complications will improve Outcome: Adequate for Discharge Goal: Cardiovascular complication will be avoided Outcome: Adequate for Discharge   Problem: Activity: Goal: Risk for activity intolerance will decrease Outcome: Adequate for Discharge   Problem: Nutrition: Goal: Adequate nutrition will be maintained Outcome: Adequate for Discharge   Problem: Coping: Goal: Level of anxiety will decrease Outcome: Adequate for Discharge   Problem: Elimination: Goal: Will not experience complications related to bowel motility Outcome: Adequate for Discharge Goal: Will not experience complications related to urinary retention Outcome: Adequate for Discharge   Problem: Pain Managment: Goal: General experience of comfort will improve Outcome: Adequate for Discharge   Problem: Safety: Goal: Ability to remain free from injury will  improve Outcome: Adequate for Discharge   Problem: Skin Integrity: Goal: Risk for impaired skin integrity will decrease Outcome: Adequate for Discharge

## 2019-12-11 NOTE — Discharge Summary (Signed)
PATIENT DETAILS Name: [name] Age: 64 y.o. Sex: male Date of Birth: Oct 30, 1955 MRN: [id number]. Admitting Physician: Lequita Halt, MD [id number], Hunt Oris, MD  Admit Date: 12/09/2019 Discharge date: 12/11/2019  Recommendations for Outpatient Follow-up:  1. Follow up with PCP in 1-2 weeks 2. Please obtain CMP/CBC in one week 3. Repeat Chest Xray in 4-6 week 4. Please ensure follow-up at the CHF clinic 5. Patient will get doses of remdesivir on 2/27 and 2/28 (to complete 5-day course) at infusion center at [facility]. 6. Patient had a  low a.m. cortisol level done on admission-however patient was already on prednisone prior to this hospital stay-PCP at some point to consider repeating a.m. cortisol/ACTH stimulation test (after discontinuation of tapering steroids). 7. Entresto on hold-see below  Admitted From:  Home  Disposition:Home with home health [place]:  Yes  Equipment/Devices: None  Discharge Condition: Stable  CODE STATUS: FULL CODE  Diet recommendation:  Diet Order            Diet - low sodium heart healthy        Diet Heart Room service appropriate? Yes; Fluid consistency: Thin  Diet effective now               Brief Summary: See H&P, Labs, Consult and Test reports for all details in brief, Patient is a 64 y.o. male with PMHx of chronic systolic heart failure, atrial fibrillation on Eliquis, COPD on prn home O2, anxiety/depression-who was diagnosed with COVID-19 on 2/12-presented to the ED with cough and shortness of breath-found to have COVID-19 pneumonia with hypotension likely secondary to hypovolemia.  Patient was subsequently admitted to the hospitalist service.  See below for further details.  Brief Hospital Course: Covid 19 Viral pneumonia:  Improved-on room air-treated with steroids and remdesivir.  Briefly started on IV antibiotics-but was discontinued due to low suspicion of bacterial pneumonia.  Inflammatory markers are  downtrending-patient will complete 2 further doses of remdesivir in the infusion center at [facility] as outpatient, he will continue with tapering prednisone.    COVID-19 Labs:  Recent Labs    12/09/19 1004 12/10/19 0747 12/11/19 0507  DDIMER 12.38* 6.67* 3.48*  FERRITIN 262 226 282  LDH 171  --   --   CRP 9.4* 7.8* 4.9*    Lab Results  Component Value Date   SARSCOV2NAA POSITIVE (A) 12/09/2019    COVID-19 Medications: Steroids: 2/24>> continued on taper on discharge. Remdesivir: 2/24>> 2/28 (will be completed as outpatient in the infusion center)  Antibiotics: Vancomycin: 2/24>> 2/25 Cefepime: 2/24>> 2/25  Elevated D-dimer: Probably secondary to COVID-19-now downtrending-CTA chest negative for PE.    Not hypoxic.  Already on Eliquis-doubt further work-up required.  Hypotension: Suspected to be from hypovolemia (patient had diarrhea this past Sunday/Monday and was also recently started on Entresto)-resolved with IVF.  Hypomagnesemia: Repleted  Thrombocytopenia: Appears to be mild-likely secondary to Covid-follow.  Chronic systolic heart failure (EF 35-40% by TTE on 11/06/2019)-s/p AICD placement: Euvolemic on exam-diuretics/beta-blocker/Entresto were held given hypotension.  BP now much improved-creeping up-Case discussed/chart reviewed with Dr. Vicente Serene are resuming Coreg, Aldactone and Demadex on discharge.  Per patient-he never felt "right" after starting Entresto-this is being held till he follows with cardiology who will decide whether this will be resumed or not.    Chronic A. fib: Rate controlled-on digoxin-Coreg being resumed on discharge-continue Eliquis.   HLD: Continue statin  COPD: Stable-continue bronchodilators  Spongiotic dermatitis/rheumatoid arthritis: Maintained on methotrexate as  outpatient  Obesity: Estimated body mass index is 34.03 kg/m as calculated from the following:   Height as of this encounter: 5\' 10"  (1.778  m).   Weight as of this encounter: 107.6 kg.    Procedures/Studies: None  Discharge Diagnoses:  Active Problems:   COVID-19 virus infection   COVID-19   Hypotension   Discharge Instructions:    Person Under Monitoring Name: [name]  Location: [place] [address]   Infection Prevention Recommendations for Individuals Confirmed to have, or Being Evaluated for, 2019 Novel Coronavirus (COVID-19) Infection Who Receive Care at Home  Individuals who are confirmed to have, or are being evaluated for, COVID-19 should follow the prevention steps below until a healthcare provider or local or state health department says they can return to normal activities.  Stay home except to get medical care You should restrict activities outside your home, except for getting medical care. Do not go to work, school, or public areas, and do not use public transportation or taxis.  Call ahead before visiting your doctor Before your medical appointment, call the healthcare provider and tell them that you have, or are being evaluated for, COVID-19 infection. This will help the healthcare provider's office take steps to keep other people from getting infected. Ask your healthcare provider to call the local or state health department.  Monitor your symptoms Seek prompt medical attention if your illness is worsening (e.g., difficulty breathing). Before going to your medical appointment, call the healthcare provider and tell them that you have, or are being evaluated for, COVID-19 infection. Ask your healthcare provider to call the local or state health department.  Wear a facemask You should wear a facemask that covers your nose and mouth when you are in the same room with other people and when you visit a healthcare provider. People who live with or visit you should also wear a facemask while they are in the same room with you.  Separate yourself from other people in your  home As much as possible, you should stay in a different room from other people in your home. Also, you should use a separate bathroom, if available.  Avoid sharing household items You should not share dishes, drinking glasses, cups, eating utensils, towels, bedding, or other items with other people in your home. After using these items, you should wash them thoroughly with soap and water.  Cover your coughs and sneezes Cover your mouth and nose with a tissue when you cough or sneeze, or you can cough or sneeze into your sleeve. Throw used tissues in a lined trash can, and immediately wash your hands with soap and water for at least 20 seconds or use an alcohol-based hand rub.  Wash your [facility] your hands often and thoroughly with soap and water for at least 20 seconds. You can use an alcohol-based hand sanitizer if soap and water are not available and if your hands are not visibly dirty. Avoid touching your eyes, nose, and mouth with unwashed hands.   Prevention Steps for Caregivers and Household Members of Individuals Confirmed to have, or Being Evaluated for, COVID-19 Infection Being Cared for in the Home  If you live with, or provide care at home for, a person confirmed to have, or being evaluated for, COVID-19 infection please follow these guidelines to prevent infection:  Follow healthcare provider's instructions Make sure that you understand and can help the patient follow any healthcare provider instructions for all care.  Provide  for the patient's basic needs You should help the patient with basic needs in the home and provide support for getting groceries, prescriptions, and other personal needs.  Monitor the patient's symptoms If they are getting sicker, call his or her medical provider and tell them that the patient has, or is being evaluated for, COVID-19 infection. This will help the healthcare provider's office take steps to keep other people from getting  infected. Ask the healthcare provider to call the local or state health department.  Limit the number of people who have contact with the patient  If possible, have only one caregiver for the patient.  Other household members should stay in another home or place of residence. If this is not possible, they should stay  in another room, or be separated from the patient as much as possible. Use a separate bathroom, if available.  Restrict visitors who do not have an essential need to be in the home.  Keep older adults, very young children, and other sick people away from the patient Keep older adults, very young children, and those who have compromised immune systems or chronic health conditions away from the patient. This includes people with chronic heart, lung, or kidney conditions, diabetes, and cancer.  Ensure good ventilation Make sure that shared spaces in the home have good air flow, such as from an air conditioner or an opened window, weather permitting.  Wash your hands often  Wash your hands often and thoroughly with soap and water for at least 20 seconds. You can use an alcohol based hand sanitizer if soap and water are not available and if your hands are not visibly dirty.  Avoid touching your eyes, nose, and mouth with unwashed hands.  Use disposable paper towels to dry your hands. If not available, use dedicated cloth towels and replace them when they become wet.  Wear a facemask and gloves  Wear a disposable facemask at all times in the room and gloves when you touch or have contact with the patient's blood, body fluids, and/or secretions or excretions, such as sweat, saliva, sputum, nasal mucus, vomit, urine, or feces.  Ensure the mask fits over your nose and mouth tightly, and do not touch it during use.  Throw out disposable facemasks and gloves after using them. Do not reuse.  Wash your hands immediately after removing your facemask and gloves.  If your personal  clothing becomes contaminated, carefully remove clothing and launder. Wash your hands after handling contaminated clothing.  Place all used disposable facemasks, gloves, and other waste in a lined container before disposing them with other household waste.  Remove gloves and wash your hands immediately after handling these items.  Do not share dishes, glasses, or other household items with the patient  Avoid sharing household items. You should not share dishes, drinking glasses, cups, eating utensils, towels, bedding, or other items with a patient who is confirmed to have, or being evaluated for, COVID-19 infection.  After the person uses these items, you should wash them thoroughly with soap and water.  Wash laundry thoroughly  Immediately remove and wash clothes or bedding that have blood, body fluids, and/or secretions or excretions, such as sweat, saliva, sputum, nasal mucus, vomit, urine, or feces, on them.  Wear gloves when handling laundry from the patient.  Read and follow directions on labels of laundry or clothing items and detergent. In general, wash and dry with the warmest temperatures recommended on the label.  Clean all areas the individual has  used often  Clean all touchable surfaces, such as counters, tabletops, doorknobs, bathroom fixtures, toilets, phones, keyboards, tablets, and bedside tables, every day. Also, clean any surfaces that may have blood, body fluids, and/or secretions or excretions on them.  Wear gloves when cleaning surfaces the patient has come in contact with.  Use a diluted bleach solution (e.g., dilute bleach with 1 part bleach and 10 parts water) or a household disinfectant with a label that says EPA-registered for coronaviruses. To make a bleach solution at home, add 1 tablespoon of bleach to 1 quart (4 cups) of water. For a larger supply, add  cup of bleach to 1 gallon (16 cups) of water.  Read labels of cleaning products and follow  recommendations provided on product labels. Labels contain instructions for safe and effective use of the cleaning product including precautions you should take when applying the product, such as wearing gloves or eye protection and making sure you have good ventilation during use of the product.  Remove gloves and wash hands immediately after cleaning.  Monitor yourself for signs and symptoms of illness Caregivers and household members are considered close contacts, should monitor their health, and will be asked to limit movement outside of the home to the extent possible. Follow the monitoring steps for close contacts listed on the symptom monitoring form.   ? If you have additional questions, contact your local health department or call the epidemiologist on call at [phone] (available 24/7). ? This guidance is subject to change. For the most up-to-date guidance from CDC, please refer to their website: YouBlogs.pl    Activity:  As tolerated  Discharge Instructions    (HEART FAILURE PATIENTS) Call MD:  Anytime you have any of the following symptoms: 1) 3 pound weight gain in 24 hours or 5 pounds in 1 week 2) shortness of breath, with or without a dry hacking cough 3) swelling in the hands, feet or stomach 4) if you have to sleep on extra pillows at night in order to breathe.   Complete by: As directed    Call MD for:  difficulty breathing, headache or visual disturbances   Complete by: As directed    Call MD for:  persistant dizziness or light-headedness   Complete by: As directed    Call MD for:  persistant nausea and vomiting   Complete by: As directed    Diet - low sodium heart healthy   Complete by: As directed    Discharge instructions   Complete by: As directed    1.)  3 weeks of isolation from 11/27/2019  2.)You are scheduled for an outpatient infusion of Remdesivir at 10:00 AM on Saturday 2/27 and Sunday  2/28.[name]  Please report to [facility] at [address]  Drive to the security guard and tell them you are here for an infusion. They will direct you to the front entrance where we will come and get you.  For questions call [phone]   Follow with Primary MD  [name], MD in 1 weeks  Follow at the CHF/advanced heart failure clinic with Dr. Marlyce Huge in the next 1-2 weeks.  Please get a complete blood count and chemistry panel checked by your Primary MD at your next visit, and again as instructed by your Primary MD.  Get Medicines reviewed and adjusted: Please take all your medications with you for your next visit with your Primary MD  Laboratory/radiological data: Please request your Primary MD to go over all  hospital tests and procedure/radiological results at the follow up, please ask your Primary MD to get all Hospital records sent to his/her office.  In some cases, they will be blood work, cultures and biopsy results pending at the time of your discharge. Please request that your primary care M.D. follows up on these results.  Also Note the following: If you experience worsening of your admission symptoms, develop shortness of breath, life threatening emergency, suicidal or homicidal thoughts you must seek medical attention immediately by calling 911 or calling your MD immediately  if symptoms less severe.  You must read complete instructions/literature along with all the possible adverse reactions/side effects for all the Medicines you take and that have been prescribed to you. Take any new Medicines after you have completely understood and accpet all the possible adverse reactions/side effects.   Do not drive when taking Pain medications or sleeping medications (Benzodaizepines)  Do not take more than prescribed Pain, Sleep and Anxiety Medications. It is not advisable to combine anxiety,sleep and pain medications without talking with your primary care  practitioner  Special Instructions: If you have smoked or chewed Tobacco  in the last 2 yrs please stop smoking, stop any regular Alcohol  and or any Recreational drug use.  Wear Seat belts while driving.  Please note: You were cared for by a hospitalist during your hospital stay. Once you are discharged, your primary care physician will handle any further medical issues. Please note that NO REFILLS for any discharge medications will be authorized once you are discharged, as it is imperative that you return to your primary care physician (or establish a relationship with a primary care physician if you do not have one) for your post hospital discharge needs so that they can reassess your need for medications and monitor your lab values.   Increase activity slowly   Complete by: As directed      Allergies as of 12/11/2019      Reactions   Amiodarone Other (See Comments)   hyperthyroidism   Statins Other (See Comments)   myalgia   Tape Other (See Comments)   Skin Tears Use Paper Tape Only      Medication List    STOP taking these medications   sacubitril-valsartan 24-26 MG Commonly known as: ENTRESTO     TAKE these medications   acetaminophen 500 MG tablet Commonly known as: TYLENOL Take 500 mg by mouth every 6 (six) hours as needed.   albuterol 108 (90 Base) MCG/ACT inhaler Commonly known as: Ventolin HFA INHALE 2 PUFFS 4 TIMES A DAY as needed What changed:   how much to take  how to take this  when to take this  reasons to take this   ALPRAZolam 0.5 MG tablet Commonly known as: XANAX TAKE 2 TABLETS BY MOUTH AT BEDTIME What changed: additional instructions   apixaban 5 MG Tabs tablet Commonly known as: Eliquis Take 1 tablet (5 mg total) by mouth 2 (two) times daily.   benzonatate 100 MG capsule Commonly known as: TESSALON Take 1-2 capsules (100-200 mg total) by mouth 3 (three) times daily as needed for cough.   carisoprodol 350 MG tablet Commonly known as:  SOMA TAKE 1 TABLET BY MOUTH THREE TIMES A DAY AS NEEDED FOR MUSCLE SPASMS What changed: See the new instructions.   carvedilol 12.5 MG tablet Commonly known as: COREG TAKE 1 TABLET (12.5 MG TOTAL) BY MOUTH 2 (TWO) TIMES DAILY WITH A MEAL.   digoxin 0.25 MG tablet Commonly known  as: LANOXIN TAKE 1/2 TABLET BY MOUTH EVERY DAY   doxepin 10 MG capsule Commonly known as: SINEQUAN TAKE 2 CAPSULES BY MOUTH AT BEDTIME. ANNUAL APPT DUE IN JUNE MUST SEE PROVIDER FOR FUTURE REFILLS What changed: See the new instructions.   fluticasone 50 MCG/ACT nasal spray Commonly known as: FLONASE PLACE 2 SPRAYS INTO BOTH NOSTRILS DAILY AS NEEDED FOR ALLERGIES OR RHINITIS.   Fluticasone-Salmeterol 250-50 MCG/DOSE Aepb Commonly known as: Advair Diskus Inhale 1 puff into the lungs 2 (two) times daily.   hydrocortisone 2.5 % cream APPLY TO AFFECTED AREA TWICE A DAY What changed: See the new instructions.   hydrOXYzine 10 MG tablet Commonly known as: ATARAX/VISTARIL TAKE 1 TABLET BY MOUTH THREE TIMES A DAY AS NEEDED What changed: reasons to take this   methotrexate 2.5 MG tablet Commonly known as: RHEUMATREX TAKE 4 TABLETS BY MOUTH EVERY WEEK What changed:   how much to take  how to take this  when to take this  additional instructions   omeprazole 20 MG capsule Commonly known as: PRILOSEC Take 1 capsule (20 mg total) by mouth daily with supper.   predniSONE 10 MG tablet Commonly known as: DELTASONE Take 40 mg daily for 1 day, 30 mg daily for 1 day, 20 mg daily for 1 days,10 mg daily for 1 day, then stop What changed:   medication strength  how much to take  how to take this  when to take this  additional instructions   rosuvastatin 5 MG tablet Commonly known as: CRESTOR Take 1 tablet (5 mg total) by mouth daily.   silver sulfADIAZINE 1 % cream Commonly known as: SILVADENE APPLY TOPICALLY TO AFFECTED AREA EVERY DAY What changed: See the new instructions.   spironolactone  25 MG tablet Commonly known as: ALDACTONE TAKE 1 TABLET BY MOUTH EVERY DAY   torsemide 20 MG tablet Commonly known as: DEMADEX TAKE 2 TABLETS BY MOUTH TWICE A DAY What changed: when to take this   traMADol 50 MG tablet Commonly known as: ULTRAM Take 1 tablet (50 mg total) by mouth daily as needed.   traZODone 50 MG tablet Commonly known as: DESYREL TAKE 1 TABLET (50 MG TOTAL) BY MOUTH AT BEDTIME AS NEEDED FOR SLEEP.   triamcinolone 0.1 % paste Commonly known as: KENALOG APPLY 1 APPLICATION IN THE MOUTH OR THROAT 2 (TWO) TIMES DAILY AS DIRECTED What changed: See the new instructions.   Tums 500 MG chewable tablet Generic drug: calcium carbonate Chew 500-2,000 mg by mouth every 4 (four) hours as needed for indigestion or heartburn.   valACYclovir 1000 MG tablet Commonly known as: VALTREX TAKE 1 TABLET BY MOUTH THREE TIMES A DAY What changed:   when to take this  reasons to take this   venlafaxine XR 37.5 MG 24 hr capsule Commonly known as: EFFEXOR-XR TAKE ONE CAPSULE BY MOUTH EVERY DAY WITH BREAKFAST What changed: See the new instructions.   zolpidem 10 MG tablet Commonly known as: AMBIEN TAKE 1 TABLET BY MOUTH EVERYDAY AT BEDTIME What changed: See the new instructions.      Follow-up Information    [facility] Follow up.   Specialty: Home Health Services Why: Home Health Contact information: [place] [address] [phone]        [name], MD. Schedule an appointment as soon as possible for a visit in 1 week(s).   Specialties: Internal Medicine, Radiology Contact information: [place] [address] [phone]  Larey Dresser, MD. Schedule an appointment as soon as possible for a visit in 2 week(s).   Specialty: Cardiology Contact information: [id number] [place] [address] [phone]          Allergies  Allergen Reactions  . Amiodarone  Other (See Comments)    hyperthyroidism  . Statins Other (See Comments)    myalgia  . Tape Other (See Comments)    Skin Tears Use Paper Tape Only     Consultations:   None   Other Procedures/Studies: CT Angio Chest PE W/Cm &/Or Wo Cm  Result Date: 12/09/2019 CLINICAL DATA:  Shortness of breath.  COVID-19 positive EXAM: CT ANGIOGRAPHY CHEST WITH CONTRAST TECHNIQUE: Multidetector CT imaging of the chest was performed using the standard protocol during bolus administration of intravenous contrast. Multiplanar CT image reconstructions and MIPs were obtained to evaluate the vascular anatomy. CONTRAST:  25mL OMNIPAQUE IOHEXOL 350 MG/ML SOLN COMPARISON:  Chest radiograph December 09, 2019 FINDINGS: Cardiovascular: There is no demonstrable pulmonary embolus. There is no thoracic aortic aneurysm. No dissection evident. The contrast bolus in the aorta is less than optimal for dissection assessment. There are foci of aortic atherosclerosis. Visualized great vessels appear normal. There are foci of coronary artery calcification. There is a pacemaker with lead tips attached to right atrium, right ventricle, and coronary sinus. There is no pericardial effusion or pericardial thickening. Mediastinum/Nodes: Thyroid appears normal. No thoracic adenopathy appreciable. No esophageal lesions evident. Lungs/Pleura: There is airspace opacity with relative consolidation in both lower lobes, primarily posteriorly in each lower lobe as well as in a portion of the superior segment right lower lobe. Also small area of infiltrate noted in the inferior aspect of the posterior segment right upper lobe. No pleural effusions evident. Upper Abdomen: Visualized upper abdominal structures appear normal. Musculoskeletal: There is degenerative change in the thoracic spine. There is diffuse idiopathic skeletal hyperostosis in the lower thoracic region. No blastic or lytic bone lesions. No chest wall lesions. Pacemaker device is  positioned anteriorly on the left. Review of the MIP images confirms the above findings. IMPRESSION: 1. No demonstrable pulmonary embolus. No thoracic aortic aneurysm. No dissection evident with contrast bolus noted to be less than optimal for dissection assessment. There is aortic atherosclerosis as well as foci of coronary artery calcification. 2. Pacemaker present with lead tips attached to right atrium, right ventricle, and coronary sinus. 3. Pneumonia with consolidation in both lower lobes, primarily posteriorly as well as in portions of the inferior, posterior segment right upper lobe and in the anterior aspect of the superior segment of the right lower lobe. 4.  No evident adenopathy. Aortic Atherosclerosis (ICD10-I70.0). Electronically Signed   By: Lowella Grip III M.D.   On: 12/09/2019 15:24   DG Chest Portable 2 Views  Result Date: 12/07/2019 CLINICAL DATA:  Dyspnea. Shortness of breath. EXAM: CHEST  2 VIEW PORTABLE COMPARISON:  01/14/2018 FINDINGS: Left-sided pacemaker in place. Unchanged hyperinflation and bronchial thickening. Unchanged mild cardiomegaly. Stable mediastinal contours. No pleural effusion, pulmonary edema, focal airspace disease or pneumothorax. Mild lingular scarring. No acute osseous abnormalities are seen. IMPRESSION: Stable hyperinflation and bronchial thickening.  No acute findings. Electronically Signed   By: Keith Rake M.D.   On: 12/07/2019 20:28   DG Chest Port 1 View  Result Date: 12/09/2019 CLINICAL DATA:  COVID positive. EXAM: PORTABLE CHEST 1 VIEW COMPARISON:  12/07/2019 FINDINGS: Cardiac enlargement. Left chest wall ICD is noted with leads in the right atrial appendage and right  ventricle. No pleural effusion or edema. No airspace densities identified. The visualized osseous structures are unremarkable. IMPRESSION: 1. No acute findings. 2. Cardiac enlargement. Electronically Signed   By: Kerby Moors M.D.   On: 12/09/2019 11:56     TODAY-DAY OF  DISCHARGE:  Subjective:   [name] today has no headache,no chest abdominal pain,no new weakness tingling or numbness, feels much better wants to go home today.   Objective:   Blood pressure 131/76, pulse 70, temperature 98 F (36.7 C), temperature source Oral, resp. rate 18, height 5\' 10"  (1.778 m), weight 107.6 kg, SpO2 94 %.  Intake/Output Summary (Last 24 hours) at 12/11/2019 1127 Last data filed at 12/11/2019 [id number] Gross per 24 hour  Intake 639.34 ml  Output 275 ml  Net 364.34 ml   Filed Weights   12/09/19 1126 12/10/19 0411  Weight: 107.5 kg 107.6 kg    Exam: Awake Alert, Oriented *3, No new F.N deficits, Normal affect Brooks.AT,PERRAL Supple Neck,No JVD, No cervical lymphadenopathy appriciated.  Symmetrical Chest wall movement, Good air movement bilaterally, CTAB RRR,No Gallops,Rubs or new Murmurs, No Parasternal Heave +ve B.Sounds, Abd Soft, Non tender, No organomegaly appriciated, No rebound -guarding or rigidity. No Cyanosis, Clubbing or edema, No new Rash or bruise   PERTINENT RADIOLOGIC STUDIES: CT Angio Chest PE W/Cm &/Or Wo Cm  Result Date: 12/09/2019 CLINICAL DATA:  Shortness of breath.  COVID-19 positive EXAM: CT ANGIOGRAPHY CHEST WITH CONTRAST TECHNIQUE: Multidetector CT imaging of the chest was performed using the standard protocol during bolus administration of intravenous contrast. Multiplanar CT image reconstructions and MIPs were obtained to evaluate the vascular anatomy. CONTRAST:  33mL OMNIPAQUE IOHEXOL 350 MG/ML SOLN COMPARISON:  Chest radiograph December 09, 2019 FINDINGS: Cardiovascular: There is no demonstrable pulmonary embolus. There is no thoracic aortic aneurysm. No dissection evident. The contrast bolus in the aorta is less than optimal for dissection assessment. There are foci of aortic atherosclerosis. Visualized great vessels appear normal. There are foci of coronary artery calcification. There is a pacemaker with lead tips attached to right  atrium, right ventricle, and coronary sinus. There is no pericardial effusion or pericardial thickening. Mediastinum/Nodes: Thyroid appears normal. No thoracic adenopathy appreciable. No esophageal lesions evident. Lungs/Pleura: There is airspace opacity with relative consolidation in both lower lobes, primarily posteriorly in each lower lobe as well as in a portion of the superior segment right lower lobe. Also small area of infiltrate noted in the inferior aspect of the posterior segment right upper lobe. No pleural effusions evident. Upper Abdomen: Visualized upper abdominal structures appear normal. Musculoskeletal: There is degenerative change in the thoracic spine. There is diffuse idiopathic skeletal hyperostosis in the lower thoracic region. No blastic or lytic bone lesions. No chest wall lesions. Pacemaker device is positioned anteriorly on the left. Review of the MIP images confirms the above findings. IMPRESSION: 1. No demonstrable pulmonary embolus. No thoracic aortic aneurysm. No dissection evident with contrast bolus noted to be less than optimal for dissection assessment. There is aortic atherosclerosis as well as foci of coronary artery calcification. 2. Pacemaker present with lead tips attached to right atrium, right ventricle, and coronary sinus. 3. Pneumonia with consolidation in both lower lobes, primarily posteriorly as well as in portions of the inferior, posterior segment right upper lobe and in the anterior aspect of the superior segment of the right lower lobe. 4.  No evident adenopathy. Aortic Atherosclerosis (ICD10-I70.0). Electronically Signed   By: Lowella Grip III M.D.   On: 12/09/2019 15:24  DG Chest Portable 2 Views  Result Date: 12/07/2019 CLINICAL DATA:  Dyspnea. Shortness of breath. EXAM: CHEST  2 VIEW PORTABLE COMPARISON:  01/14/2018 FINDINGS: Left-sided pacemaker in place. Unchanged hyperinflation and bronchial thickening. Unchanged mild cardiomegaly. Stable mediastinal  contours. No pleural effusion, pulmonary edema, focal airspace disease or pneumothorax. Mild lingular scarring. No acute osseous abnormalities are seen. IMPRESSION: Stable hyperinflation and bronchial thickening.  No acute findings. Electronically Signed   By: Keith Rake M.D.   On: 12/07/2019 20:28   DG Chest Port 1 View  Result Date: 12/09/2019 CLINICAL DATA:  COVID positive. EXAM: PORTABLE CHEST 1 VIEW COMPARISON:  12/07/2019 FINDINGS: Cardiac enlargement. Left chest wall ICD is noted with leads in the right atrial appendage and right ventricle. No pleural effusion or edema. No airspace densities identified. The visualized osseous structures are unremarkable. IMPRESSION: 1. No acute findings. 2. Cardiac enlargement. Electronically Signed   By: Kerby Moors M.D.   On: 12/09/2019 11:56     PERTINENT LAB RESULTS: CBC: Recent Labs    12/10/19 0747 12/11/19 0507  WBC 6.3 5.2  HGB 13.1 12.4*  HCT 38.2* 36.3*  PLT 126* 132*   CMET CMP     Component Value Date/Time   NA 139 12/11/2019 0507   NA 141 12/19/2017 1532   K 4.0 12/11/2019 0507   CL 107 12/11/2019 0507   CO2 23 12/11/2019 0507   GLUCOSE 127 (H) 12/11/2019 0507   BUN 17 12/11/2019 0507   BUN 20 12/19/2017 1532   CREATININE 0.84 12/11/2019 0507   CREATININE 0.92 03/19/2016 1420   CALCIUM 8.7 (L) 12/11/2019 0507   PROT 5.1 (L) 12/11/2019 0507   PROT 6.8 12/19/2017 1532   ALBUMIN 2.6 (L) 12/11/2019 0507   ALBUMIN 4.4 12/19/2017 1532   AST 25 12/11/2019 0507   ALT 30 12/11/2019 0507   ALKPHOS 35 (L) 12/11/2019 0507   BILITOT 0.5 12/11/2019 0507   BILITOT 0.4 12/19/2017 1532   GFRNONAA >60 12/11/2019 0507   GFRAA >60 12/11/2019 0507    GFR Estimated Creatinine Clearance: 109.1 mL/min (by C-G formula based on SCr of 0.84 mg/dL). No results for input(s): LIPASE, AMYLASE in the last 72 hours. No results for input(s): CKTOTAL, CKMB, CKMBINDEX, TROPONINI in the last 72 hours. Invalid input(s): POCBNP Recent Labs     12/10/19 0747 12/11/19 0507  DDIMER 6.67* 3.48*   No results for input(s): HGBA1C in the last 72 hours. Recent Labs    12/09/19 1004  TRIG 56   Recent Labs    12/09/19 1351  TSH 1.853   Recent Labs    12/10/19 0747 12/11/19 0507  FERRITIN 226 282   Coags: Recent Labs    12/09/19 1004  INR 1.1   Microbiology: Recent Results (from the past 240 hour(s))  Blood Culture (routine x 2)     Status: None (Preliminary result)   Collection Time: 12/09/19 11:09 AM   Specimen: BLOOD  Result Value Ref Range Status   Specimen Description BLOOD SITE NOT SPECIFIED  Final   Special Requests   Final    BOTTLES DRAWN AEROBIC AND ANAEROBIC Blood Culture adequate volume Performed at [facility], [place] [address] [name], [place] [address]    Culture NO GROWTH 1 DAY  Final   Report Status PENDING  Incomplete  Blood Culture (routine x 2)     Status: None (Preliminary result)   Collection Time: 12/09/19 11:22 AM   Specimen: BLOOD  Result Value Ref Range Status  Specimen Description BLOOD SITE NOT SPECIFIED  Final   Special Requests   Final    BOTTLES DRAWN AEROBIC AND ANAEROBIC Blood Culture adequate volume Performed at [facility], [address] [place], [place] [address]    Culture NO GROWTH 1 DAY  Final   Report Status PENDING  Incomplete  Respiratory Panel by RT PCR (Flu A&B, Covid) - Nasopharyngeal Swab     Status: Abnormal   Collection Time: 12/09/19 11:35 AM   Specimen: Nasopharyngeal Swab  Result Value Ref Range Status   SARS Coronavirus 2 by RT PCR POSITIVE (A) NEGATIVE Final    Comment: RESULT CALLED TO, READ BACK BY AND VERIFIED WITH: L. Bishop RN 14:25 12/09/19 ([email or web address]) (NOTE) SARS-CoV-2 target nucleic acids are DETECTED. SARS-CoV-2 RNA is generally detectable in upper respiratory specimens  during the acute phase of infection. Positive results are indicative of the presence of the identified virus, but do not rule out bacterial infection or  co-infection with other pathogens not detected by the test. Clinical correlation with patient history and other diagnostic information is necessary to determine patient infection status. The expected result is Negative. Fact Sheet for Patients:  PinkCheek.be Fact Sheet for Healthcare Providers: GravelBags.it This test is not yet approved or cleared by the [place] FDA and  has been authorized for detection and/or diagnosis of SARS-CoV-2 by FDA under an Emergency Use Authorization (EUA).  This EUA will remain in effect (meaning this test can be used)  for the duration of  the COVID-19 declaration under Section 564(b)(1) of the Act, 21 U.S.C. section 360bbb-3(b)(1), unless the authorization is terminated or revoked sooner.    Influenza A by PCR NEGATIVE NEGATIVE Final   Influenza B by PCR NEGATIVE NEGATIVE Final    Comment: (NOTE) The Xpert Xpress SARS-CoV-2/FLU/RSV assay is intended as an aid in  the diagnosis of influenza from Nasopharyngeal swab specimens and  should not be used as a sole basis for treatment. Nasal washings and  aspirates are unacceptable for Xpert Xpress SARS-CoV-2/FLU/RSV  testing. Fact Sheet for Patients: PinkCheek.be Fact Sheet for Healthcare Providers: GravelBags.it This test is not yet approved or cleared by the [place] FDA and  has been authorized for detection and/or diagnosis of SARS-CoV-2 by  FDA under an Emergency Use Authorization (EUA). This EUA will remain  in effect (meaning this test can be used) for the duration of the  Covid-19 declaration under Section 564(b)(1) of the Act, 21  U.S.C. section 360bbb-3(b)(1), unless the authorization is  terminated or revoked. Performed at [facility], [place] [address], [place], [place] [address]   Urine culture     Status: None   Collection Time: 12/09/19 12:51 PM   Specimen:  Urine, Random  Result Value Ref Range Status   Specimen Description URINE, RANDOM  Final   Special Requests NONE  Final   Culture   Final    NO GROWTH Performed at [facility], [place] [address] [place], [place] [address]    Report Status 12/10/2019 FINAL  Final    FURTHER DISCHARGE INSTRUCTIONS:  Get Medicines reviewed and adjusted: Please take all your medications with you for your next visit with your Primary MD  Laboratory/radiological data: Please request your Primary MD to go over all hospital tests and procedure/radiological results at the follow up, please ask your Primary MD to get all Hospital records sent to his/her office.  In some cases, they will be blood work, cultures and biopsy results pending  at the time of your discharge. Please request that your primary care M.D. goes through all the records of your hospital data and follows up on these results.  Also Note the following: If you experience worsening of your admission symptoms, develop shortness of breath, life threatening emergency, suicidal or homicidal thoughts you must seek medical attention immediately by calling 911 or calling your MD immediately  if symptoms less severe.  You must read complete instructions/literature along with all the possible adverse reactions/side effects for all the Medicines you take and that have been prescribed to you. Take any new Medicines after you have completely understood and accpet all the possible adverse reactions/side effects.   Do not drive when taking Pain medications or sleeping medications (Benzodaizepines)  Do not take more than prescribed Pain, Sleep and Anxiety Medications. It is not advisable to combine anxiety,sleep and pain medications without talking with your primary care practitioner  Special Instructions: If you have smoked or chewed Tobacco  in the last 2 yrs please stop smoking, stop any regular Alcohol  and or any Recreational drug use.  Wear Seat belts  while driving.  Please note: You were cared for by a hospitalist during your hospital stay. Once you are discharged, your primary care physician will handle any further medical issues. Please note that NO REFILLS for any discharge medications will be authorized once you are discharged, as it is imperative that you return to your primary care physician (or establish a relationship with a primary care physician if you do not have one) for your post hospital discharge needs so that they can reassess your need for medications and monitor your lab values.  Total Time spent coordinating discharge including counseling, education and face to face time equals 35 minutes.  SignedOren Binet 12/11/2019 11:27 AM

## 2019-12-11 NOTE — Progress Notes (Signed)
Patient scheduled for outpatient Remdesivir infusion at 10:00 AM on Saturday 2/27 and Sunday 2/28.  Please advise them to report to [facility] at [address]  Drive to the security guard and tell them you are here for an infusion. They will direct you to the front entrance where we will come and get you.  For questions call [phone].  Thanks

## 2019-12-11 NOTE — Discharge Instructions (Addendum)
You are scheduled for an outpatient infusion of Remdesivir at 10:00 AM on Saturday 2/27 and Sunday 2/28.[name]  Please report to [facility] at [address].  Drive to the security guard and tell them you are here for an infusion. They will direct you to the front entrance where we will come and get you.  For questions call [phone].  Thanks        Person Under Monitoring Name: [name]  Location: [address]  [place] [address]   Infection Prevention Recommendations for Individuals Confirmed to have, or Being Evaluated for, 2019 Novel Coronavirus (COVID-19) Infection Who Receive Care at Home  Individuals who are confirmed to have, or are being evaluated for, COVID-19 should follow the prevention steps below until a healthcare provider or local or state health department says they can return to normal activities.  Stay home except to get medical care You should restrict activities outside your home, except for getting medical care. Do not go to work, school, or public areas, and do not use public transportation or taxis.  Call ahead before visiting your doctor Before your medical appointment, call the healthcare provider and tell them that you have, or are being evaluated for, COVID-19 infection. This will help the healthcare providers office take steps to keep other people from getting infected. Ask your healthcare provider to call the local or state health department.  Monitor your symptoms Seek prompt medical attention if your illness is worsening (e.g., difficulty breathing). Before going to your medical appointment, call the healthcare provider and tell them that you have, or are being evaluated for, COVID-19 infection. Ask your healthcare provider to call the local or state health department.  Wear a facemask You should wear a facemask that covers your nose and mouth when you are in the same room with other people and when you visit a healthcare  provider. People who live with or visit you should also wear a facemask while they are in the same room with you.  Separate yourself from other people in your home As much as possible, you should stay in a different room from other people in your home. Also, you should use a separate bathroom, if available.  Avoid sharing household items You should not share dishes, drinking glasses, cups, eating utensils, towels, bedding, or other items with other people in your home. After using these items, you should wash them thoroughly with soap and water.  Cover your coughs and sneezes Cover your mouth and nose with a tissue when you cough or sneeze, or you can cough or sneeze into your sleeve. Throw used tissues in a lined trash can, and immediately wash your hands with soap and water for at least 20 seconds or use an alcohol-based hand rub.  Wash your [facility] your hands often and thoroughly with soap and water for at least 20 seconds. You can use an alcohol-based hand sanitizer if soap and water are not available and if your hands are not visibly dirty. Avoid touching your eyes, nose, and mouth with unwashed hands.   Prevention Steps for Caregivers and Household Members of Individuals Confirmed to have, or Being Evaluated for, COVID-19 Infection Being Cared for in the Home  If you live with, or provide care at home for, a person confirmed to have, or being evaluated for, COVID-19 infection please follow these guidelines to prevent infection:  Follow healthcare providers instructions Make sure that you understand and can help the patient follow  any healthcare provider instructions for all care.  Provide for the patients basic needs You should help the patient with basic needs in the home and provide support for getting groceries, prescriptions, and other personal needs.  Monitor the patients symptoms If they are getting sicker, call his or her medical provider and tell them that the  patient has, or is being evaluated for, COVID-19 infection. This will help the healthcare providers office take steps to keep other people from getting infected. Ask the healthcare provider to call the local or state health department.  Limit the number of people who have contact with the patient  If possible, have only one caregiver for the patient.  Other household members should stay in another home or place of residence. If this is not possible, they should stay  in another room, or be separated from the patient as much as possible. Use a separate bathroom, if available.  Restrict visitors who do not have an essential need to be in the home.  Keep older adults, very young children, and other sick people away from the patient Keep older adults, very young children, and those who have compromised immune systems or chronic health conditions away from the patient. This includes people with chronic heart, lung, or kidney conditions, diabetes, and cancer.  Ensure good ventilation Make sure that shared spaces in the home have good air flow, such as from an air conditioner or an opened window, weather permitting.  Wash your hands often  Wash your hands often and thoroughly with soap and water for at least 20 seconds. You can use an alcohol based hand sanitizer if soap and water are not available and if your hands are not visibly dirty.  Avoid touching your eyes, nose, and mouth with unwashed hands.  Use disposable paper towels to dry your hands. If not available, use dedicated cloth towels and replace them when they become wet.  Wear a facemask and gloves  Wear a disposable facemask at all times in the room and gloves when you touch or have contact with the patients blood, body fluids, and/or secretions or excretions, such as sweat, saliva, sputum, nasal mucus, vomit, urine, or feces.  Ensure the mask fits over your nose and mouth tightly, and do not touch it during use.  Throw out  disposable facemasks and gloves after using them. Do not reuse.  Wash your hands immediately after removing your facemask and gloves.  If your personal clothing becomes contaminated, carefully remove clothing and launder. Wash your hands after handling contaminated clothing.  Place all used disposable facemasks, gloves, and other waste in a lined container before disposing them with other household waste.  Remove gloves and wash your hands immediately after handling these items.  Do not share dishes, glasses, or other household items with the patient  Avoid sharing household items. You should not share dishes, drinking glasses, cups, eating utensils, towels, bedding, or other items with a patient who is confirmed to have, or being evaluated for, COVID-19 infection.  After the person uses these items, you should wash them thoroughly with soap and water.  Wash laundry thoroughly  Immediately remove and wash clothes or bedding that have blood, body fluids, and/or secretions or excretions, such as sweat, saliva, sputum, nasal mucus, vomit, urine, or feces, on them.  Wear gloves when handling laundry from the patient.  Read and follow directions on labels of laundry or clothing items and detergent. In general, wash and dry with the warmest temperatures recommended on  the label.  Clean all areas the individual has used often  Clean all touchable surfaces, such as counters, tabletops, doorknobs, bathroom fixtures, toilets, phones, keyboards, tablets, and bedside tables, every day. Also, clean any surfaces that may have blood, body fluids, and/or secretions or excretions on them.  Wear gloves when cleaning surfaces the patient has come in contact with.  Use a diluted bleach solution (e.g., dilute bleach with 1 part bleach and 10 parts water) or a household disinfectant with a label that says EPA-registered for coronaviruses. To make a bleach solution at home, add 1 tablespoon of bleach to 1  quart (4 cups) of water. For a larger supply, add  cup of bleach to 1 gallon (16 cups) of water.  Read labels of cleaning products and follow recommendations provided on product labels. Labels contain instructions for safe and effective use of the cleaning product including precautions you should take when applying the product, such as wearing gloves or eye protection and making sure you have good ventilation during use of the product.  Remove gloves and wash hands immediately after cleaning.  Monitor yourself for signs and symptoms of illness Caregivers and household members are considered close contacts, should monitor their health, and will be asked to limit movement outside of the home to the extent possible. Follow the monitoring steps for close contacts listed on the symptom monitoring form.   ? If you have additional questions, contact your local health department or call the epidemiologist on call at [phone] (available 24/7). ? This guidance is subject to change. For the most up-to-date guidance from [facility], please refer to their website: YouBlogs.pl

## 2019-12-11 NOTE — Progress Notes (Signed)
Physical Therapy Treatment Patient Details Name: [name] MRN: [id number] DOB: [birth year] Today's Date: 12/11/2019    History of Present Illness Pt is a 64 y.o. male with PMH significant of systolic CHF, chronic A. fib on Eliquis, COPD on as needed home oxygen, anxiety/ depression, presented with increasing cough and short of breath.  His symptoms started about 2 weeks ago, initially was dry cough, diagnosed with Covid on 2\12. Admitted to ED on 2/24 with sepsis and also has a recent dx of bronchitis. Presenting with borderline low blood pressure.     PT Comments    Pt is received standing with RN on room air. SpO2 remains at 95% or above throughout treatment. Pt is able to safely ambulate without AD with supervision for 200'. Pt practices stair training (pt has 5 STE at home with R handrail). Pt is able to ascend/ descend step 6 times with bilateral hands on RW. After resting, pt is able to perform another 5 steps with R unilateral hand on RW. No LOB demonstrated, however increased time taken to complete. Pt educated to hold on to his handrail at all times when entering and exiting his home and take things slower for improved motor control and balance strategies. D/C plans remain appropriate. PT will continue to follow acutely.    Follow Up Recommendations  Home health PT;Supervision - Intermittent     Equipment Recommendations  None recommended by PT       Precautions / Restrictions Precautions Precautions: Other (comment)(airborne) Restrictions Weight Bearing Restrictions: No    Mobility  Bed Mobility               General bed mobility comments: pt received standing with nurse   Transfers Overall transfer level: Modified independent Equipment used: None(supervision for lines, increased time) Transfers: Sit to/from Stand Sit to Stand: Supervision         General transfer comment: supervision for line management  Ambulation/Gait Ambulation/Gait assistance:  Supervision Gait Distance (Feet): 200 Feet Assistive device: None Gait Pattern/deviations: Decreased stride length Gait velocity: mildly decreased Gait velocity interpretation: >2.62 ft/sec, indicative of community ambulatory General Gait Details: Decreased bilateral heel strike, decreased cadence and step length   Stairs Stairs: Yes Stairs assistance: Min guard Stair Management: With walker Number of Stairs: 11(6 steps, rest, then another 5 steps) General stair comments: Pt requires min guard for line management and steadiness. Pt ascends/ descends 6 stairs with bilateral hands on walker. Pt then ascends/ descends 5 steps with R hand on the RW to simulate his home setup. He demonstrates good ability to stair train without LOB. Pt educated to use his handrail at home and take his time.         Balance Overall balance assessment: Needs assistance Sitting-balance support: Feet supported Sitting balance-Leahy Scale: Good     Standing balance support: No upper extremity supported Standing balance-Leahy Scale: Good Standing balance comment: Pt able to stand and ambulate without UE support and no LOB                             Cognition Arousal/Alertness: Awake/alert Behavior During Therapy: WFL for tasks assessed/performed Overall Cognitive Status: Within Functional Limits for tasks assessed                                           General  Comments General comments (skin integrity, edema, etc.): Pt was on supplemental O2 last night to sleep but was received today on no supp O2. Pt's SpO2 is 95% standing and BP is 154/115. Pt's O2 remains in the 90s throughout functional mobility and is 96% when sitting back down after ambulating 200'. BP is 145/78.      Pertinent Vitals/Pain Pain Assessment: No/denies pain           PT Goals (current goals can now be found in the care plan section) Acute Rehab PT Goals Patient Stated Goal: "to see his  girlfriend" PT Goal Formulation: With patient Time For Goal Achievement: 12/24/19 Potential to Achieve Goals: Good Progress towards PT goals: Progressing toward goals    Frequency    Min 3X/week      PT Plan Current plan remains appropriate       AM-PAC PT "6 Clicks" Mobility   Outcome Measure  Help needed turning from your back to your side while in a flat bed without using bedrails?: A Little Help needed moving from lying on your back to sitting on the side of a flat bed without using bedrails?: A Little Help needed moving to and from a bed to a chair (including a wheelchair)?: None Help needed standing up from a chair using your arms (e.g., wheelchair or bedside chair)?: None Help needed to walk in hospital room?: None Help needed climbing 3-5 steps with a railing? : A Little 6 Click Score: 21    End of Session Equipment Utilized During Treatment: Gait belt Activity Tolerance: Patient tolerated treatment well Patient left: in chair;with call bell/phone within reach;with chair alarm set Nurse Communication: Mobility status PT Visit Diagnosis: Unsteadiness on feet (R26.81);Other abnormalities of gait and mobility (R26.89);Muscle weakness (generalized) (M62.81)     Time: [id number] PT Time Calculation (min) (ACUTE ONLY): 28 min  Charges:  $Therapeutic Activity: 23-37 mins                     Jodelle Green, PT, DPT Acute Rehabilitation Services Office [phone]   Jodelle Green 12/11/2019, 12:51 PM

## 2019-12-11 NOTE — TOC Initial Note (Addendum)
Transition of Care [facility]) - Initial/Assessment Note    Patient Details  Name: [name] MRN: [id number] Date of Birth: 11/10/1955  [facility] CM/SW Contact:    Maryclare Labrador, RN Phone Number: 12/11/2019, 12:15 PM  Clinical Narrative:     PTA independent from home with daughter and son-in-law.  Pt informed CM that he did not need DME or HH PTA.  Pt confirms he has a PCP and denied barriers with paying for medications.  Pt is interested in [facility] as recommended - pt given medicare.gov HH choice - pt chose [facility].  Alvis Lemmings accepts pt pending orders - CM requested orders      CM received verbal consult for COVID at home program - pt in agreement- referral sent to program via email.  Pt informed CM that he may stay at his girlfriends home but for both COVID at home and [place] to call him and he will let them know where he is at.  [facility] aware that girlfriend stays in [place].  NO other CM needs - CM signing off            Expected Discharge Plan: [place] Barriers to Discharge: Continued Medical Work up   Patient Goals and CMS Choice Patient states their goals for this hospitalization and ongoing recovery are:: Pt states he is ready to get back to walking like he was before he got sick CMS Medicare.gov Compare Post Acute Care list provided to:: Patient Choice offered to / list presented to : Patient  Expected Discharge Plan and Services Expected Discharge Plan: [place]       Living arrangements for the past 2 months: Single Family Home Expected Discharge Date: 12/11/19                         HH Arranged: PT, OT HH Agency: [place] Date [facility] Agency Contacted: 12/11/19 Time [place]: [phone] Representative spoke with at [place]: Tommi Rumps  Prior Living Arrangements/Services Living arrangements for the past 2 months: [place] Lives with:: Adult Children Patient language and need for  interpreter reviewed:: Yes Do you feel safe going back to the place where you live?: Yes      Need for Family Participation in Patient Care: No (Comment) Care giver support system in place?: Yes (comment)   Criminal Activity/Legal Involvement Pertinent to Current Situation/Hospitalization: No - Comment as needed  Activities of Daily Living Home Assistive Devices/Equipment: None ADL Screening (condition at time of admission) Patient's cognitive ability adequate to safely complete daily activities?: Yes Is the patient deaf or have difficulty hearing?: No Does the patient have difficulty seeing, even when wearing glasses/contacts?: No Does the patient have difficulty concentrating, remembering, or making decisions?: No Patient able to express need for assistance with ADLs?: Yes Does the patient have difficulty dressing or bathing?: No Independently performs ADLs?: Yes (appropriate for developmental age) Does the patient have difficulty walking or climbing stairs?: No Weakness of Legs: None Weakness of Arms/Hands: None  Permission Sought/Granted   Permission granted to share information with : Yes, Verbal Permission Granted              Emotional Assessment   Attitude/Demeanor/Rapport: Self-Confident, Gracious, Charismatic, Engaged Affect (typically observed): Accepting, Adaptable Orientation: : Oriented to Self, Oriented to Place, Oriented to  Time, Oriented to Situation   Psych Involvement: No (comment)  Admission diagnosis:  Hypotension [I95.9] Severe  comorbid illness [R69] Sepsis, due to unspecified organism, unspecified whether acute organ dysfunction present ([place]) [A41.9] COVID-19 [U07.1] History of COVID-19 [Z86.16] Patient Active Problem List   Diagnosis Date Noted  . COVID-19 virus infection 12/09/2019  . COVID-19 12/09/2019  . Hypotension 12/09/2019  . Upper respiratory infection 11/27/2019  . COPD exacerbation ([place]) 11/27/2019  . Elevated PSA 09/23/2019  .  Left rotator cuff tear arthropathy 08/06/2019  . Lip lesion 12/23/2018  . Diabetic ulcer of left lower leg with necrosis of muscle ([place]) 12/05/2018  . Chronic bursitis of left shoulder 07/01/2018  . Pain due to total knee replacement ([place]) 03/31/2018  . Rotator cuff arthropathy, left 02/13/2018  . Right rotator cuff tear arthropathy 01/14/2018  . Increased prostate specific antigen (PSA) velocity 09/26/2017  . Trigger point of thoracic region 07/04/2017  . Right lumbar radiculopathy 05/15/2017  . Right leg swelling 05/09/2017  . Glaucoma 04/18/2017  . History of nasal polyposis 02/19/2017  . Chronic rhinitis 02/19/2017  . Microhematuria 12/04/2016  . Preventative health care 11/29/2015  . Type II diabetes mellitus ([place])   . Hyperlipidemia   . Persistent atrial fibrillation ([place]) 03/30/2015  . Syncope 03/30/2015  . Anemia 02/23/2015  . Thyrotoxicosis 02/23/2015  . Hx of adenomatous colonic polyps 10/21/2014  . De Quervain's tenosynovitis, right 04/30/2014  . Osteoarthritis of right knee 02/19/2014  . Encounter for therapeutic drug monitoring 11/17/2013  . Thoracic degenerative disc disease 06/17/2013  . Thoracic spondylosis without myelopathy 02/11/2013  . Lumbosacral spondylosis without myelopathy 02/11/2013  . Degenerative joint disease of knee, left 02/11/2013  . COPD (chronic obstructive pulmonary disease) ([place]) 08/20/2012  . Bundle branch block, right and extreme left anterior fascicular 05/05/2012  . Nonischemic cardiomyopathy ([place]) 05/02/2012  . Anticoagulated on warfarin 03/21/2012  . Spongiotic dermatitis 09/26/2011  . Past myocardial infarction 04/19/2011  . UNSPECIFIED URETHRAL STRICTURE 12/26/2010  . Chronic pain syndrome 05/18/2010  . Obstructive sleep apnea 10/21/2007  . NEPHROLITHIASIS, HX OF 10/21/2007  . Anxiety 06/09/2007  . GERD 06/09/2007  . BENIGN PROSTATIC HYPERTROPHY 06/09/2007  . Morbid obesity ([place]) 06/03/2007  . Depression 06/03/2007  . Essential  hypertension 06/03/2007  . INSOMNIA 06/03/2007   PCP:  [name], MD Pharmacy:   [id number] - [place], [place] [address] Phone: [phone] Fax: [facility] - [place], [place] [address] Phone: [phone] Fax: [phone]     Social Determinants of Health (SDOH) Interventions    Readmission Risk Interventions No flowsheet data found.

## 2019-12-12 ENCOUNTER — Ambulatory Visit (HOSPITAL_COMMUNITY)
Admission: RE | Admit: 2019-12-12 | Discharge: 2019-12-12 | Disposition: A | Discharge status: Home / Self Care | Payer: Medicare HMO | Source: Ambulatory Visit | Attending: Pulmonary Disease | Admitting: Pulmonary Disease

## 2019-12-12 VITALS — BP 133/81 | HR 70 | Temp 97.7°F | Resp 18

## 2019-12-12 DIAGNOSIS — U071 COVID-19: Secondary | ICD-10-CM | POA: Diagnosis not present

## 2019-12-12 MED ORDER — SODIUM CHLORIDE 0.9 % IV SOLN
INTRAVENOUS | Status: DC | PRN
  Administered 2019-12-12: 10:00:00 250 mL via INTRAVENOUS

## 2019-12-12 MED ORDER — ALBUTEROL SULFATE HFA 108 (90 BASE) MCG/ACT IN AERS: 2.0000 | INHALATION_SPRAY | Freq: Once | RESPIRATORY_TRACT | Status: DC | PRN

## 2019-12-12 MED ORDER — EPINEPHRINE 0.3 MG/0.3ML IJ SOAJ: 0.3000 mg | Freq: Once | INTRAMUSCULAR | Status: DC | PRN

## 2019-12-12 MED ORDER — FAMOTIDINE IN NACL 20-0.9 MG/50ML-% IV SOLN: 20.0000 mg | Freq: Once | INTRAVENOUS | Status: DC | PRN

## 2019-12-12 MED ORDER — METHYLPREDNISOLONE SODIUM SUCC 125 MG IJ SOLR: 125.0000 mg | Freq: Once | INTRAMUSCULAR | Status: DC | PRN

## 2019-12-12 MED ORDER — DIPHENHYDRAMINE HCL 50 MG/ML IJ SOLN: 50.0000 mg | Freq: Once | INTRAMUSCULAR | Status: DC | PRN

## 2019-12-12 MED ORDER — SODIUM CHLORIDE 0.9 % IV SOLN
100.0000 mg | Freq: Once | INTRAVENOUS | Status: AC
  Administered 2019-12-12: 100 mg via INTRAVENOUS
  Filled 2019-12-12: qty 20

## 2019-12-12 NOTE — Progress Notes (Signed)
  Diagnosis: COVID-19  Physician:  Procedure: Covid Infusion Clinic Med: remdesivir infusion.  Complications: No immediate complications noted.  Discharge: Discharged home   Virgilio Belling 12/12/2019

## 2019-12-12 NOTE — Discharge Instructions (Signed)
10 Things You Can Do to Manage Your COVID-19 Symptoms at Home If you have possible or confirmed COVID-19: 1. Stay home from work and school. And stay away from other public places. If you must go out, avoid using any kind of public transportation, ridesharing, or taxis. 2. Monitor your symptoms carefully. If your symptoms get worse, call your healthcare provider immediately. 3. Get rest and stay hydrated. 4. If you have a medical appointment, call the healthcare provider ahead of time and tell them that you have or may have COVID-19. 5. For medical emergencies, call 911 and notify the dispatch personnel that you have or may have COVID-19. 6. Cover your cough and sneezes with a tissue or use the inside of your elbow. 7. Wash your hands often with soap and water for at least 20 seconds or clean your hands with an alcohol-based hand sanitizer that contains at least 60% alcohol. 8. As much as possible, stay in a specific room and away from other people in your home. Also, you should use a separate bathroom, if available. If you need to be around other people in or outside of the home, wear a mask. 9. Avoid sharing personal items with other people in your household, like dishes, towels, and bedding. 10. Clean all surfaces that are touched often, like counters, tabletops, and doorknobs. Use household cleaning sprays or wipes according to the label instructions. cdc.gov/coronavirus 04/15/2019 This information is not intended to replace advice given to you by your health care provider. Make sure you discuss any questions you have with your health care provider. Document Revised: 09/17/2019 Document Reviewed: 09/17/2019 Elsevier Patient Education  2020 [employer]  

## 2019-12-13 ENCOUNTER — Ambulatory Visit (HOSPITAL_COMMUNITY)
Admit: 2019-12-13 | Discharge: 2019-12-13 | Disposition: A | Discharge status: Home / Self Care | Payer: Medicare HMO | Attending: Pulmonary Disease | Admitting: Pulmonary Disease

## 2019-12-13 DIAGNOSIS — U071 COVID-19: Secondary | ICD-10-CM

## 2019-12-13 MED ORDER — DIPHENHYDRAMINE HCL 50 MG/ML IJ SOLN: 50.0000 mg | Freq: Once | INTRAMUSCULAR | Status: DC | PRN

## 2019-12-13 MED ORDER — METHYLPREDNISOLONE SODIUM SUCC 125 MG IJ SOLR: 125.0000 mg | Freq: Once | INTRAMUSCULAR | Status: DC | PRN

## 2019-12-13 MED ORDER — SODIUM CHLORIDE 0.9 % IV SOLN: INTRAVENOUS | Status: DC | PRN

## 2019-12-13 MED ORDER — FAMOTIDINE IN NACL 20-0.9 MG/50ML-% IV SOLN: 20.0000 mg | Freq: Once | INTRAVENOUS | Status: DC | PRN

## 2019-12-13 MED ORDER — ALBUTEROL SULFATE HFA 108 (90 BASE) MCG/ACT IN AERS: 2.0000 | INHALATION_SPRAY | Freq: Once | RESPIRATORY_TRACT | Status: DC | PRN

## 2019-12-13 MED ORDER — EPINEPHRINE 0.3 MG/0.3ML IJ SOAJ: 0.3000 mg | Freq: Once | INTRAMUSCULAR | Status: DC | PRN

## 2019-12-13 MED ORDER — SODIUM CHLORIDE 0.9 % IV SOLN
100.0000 mg | Freq: Once | INTRAVENOUS | Status: AC
  Administered 2019-12-13: 10:00:00 100 mg via INTRAVENOUS
  Filled 2019-12-13: qty 20

## 2019-12-13 NOTE — Discharge Instructions (Signed)
COVID-19 COVID-19 is a respiratory infection that is caused by a virus called severe acute respiratory syndrome coronavirus 2 (SARS-CoV-2). The disease is also known as coronavirus disease or novel coronavirus. In some people, the virus may not cause any symptoms. In others, it may cause a serious infection. The infection can get worse quickly and can lead to complications, such as:  Pneumonia, or infection of the lungs.  Acute respiratory distress syndrome or ARDS. This is a condition in which fluid build-up in the lungs prevents the lungs from filling with air and passing oxygen into the blood.  Acute respiratory failure. This is a condition in which there is not enough oxygen passing from the lungs to the body or when carbon dioxide is not passing from the lungs out of the body.  Sepsis or septic shock. This is a serious bodily reaction to an infection.  Blood clotting problems.  Secondary infections due to bacteria or fungus.  Organ failure. This is when your body's organs stop working. The virus that causes COVID-19 is contagious. This means that it can spread from person to person through droplets from coughs and sneezes (respiratory secretions). What are the causes? This illness is caused by a virus. You may catch the virus by:  Breathing in droplets from an infected person. Droplets can be spread by a person breathing, speaking, singing, coughing, or sneezing.  Touching something, like a table or a doorknob, that was exposed to the virus (contaminated) and then touching your mouth, nose, or eyes. What increases the risk? Risk for infection You are more likely to be infected with this virus if you:  Are within 6 feet (2 meters) of a person with COVID-19.  Provide care for or live with a person who is infected with COVID-19.  Spend time in crowded indoor spaces or live in shared housing. Risk for serious illness You are more likely to become seriously ill from the virus if you:   Are 50 years of age or older. The higher your age, the more you are at risk for serious illness.  Live in a nursing home or long-term care facility.  Have cancer.  Have a long-term (chronic) disease such as: ? Chronic lung disease, including chronic obstructive pulmonary disease or asthma. ? A long-term disease that lowers your body's ability to fight infection (immunocompromised). ? Heart disease, including heart failure, a condition in which the arteries that lead to the heart become narrow or blocked (coronary artery disease), a disease which makes the heart muscle thick, weak, or stiff (cardiomyopathy). ? Diabetes. ? Chronic kidney disease. ? Sickle cell disease, a condition in which red blood cells have an abnormal "sickle" shape. ? Liver disease.  Are obese. What are the signs or symptoms? Symptoms of this condition can range from mild to severe. Symptoms may appear any time from 2 to 14 days after being exposed to the virus. They include:  A fever or chills.  A cough.  Difficulty breathing.  Headaches, body aches, or muscle aches.  Runny or stuffy (congested) nose.  A sore throat.  New loss of taste or smell. Some people may also have stomach problems, such as nausea, vomiting, or diarrhea. Other people may not have any symptoms of COVID-19. How is this diagnosed? This condition may be diagnosed based on:  Your signs and symptoms, especially if: ? You live in an area with a COVID-19 outbreak. ? You recently traveled to or from an area where the virus is common. ? You   provide care for or live with a person who was diagnosed with COVID-19. ? You were exposed to a person who was diagnosed with COVID-19.  A physical exam.  Lab tests, which may include: ? Taking a sample of fluid from the back of your nose and throat (nasopharyngeal fluid), your nose, or your throat using a swab. ? A sample of mucus from your lungs (sputum). ? Blood tests.  Imaging tests, which  may include, X-rays, CT scan, or ultrasound. How is this treated? At present, there is no medicine to treat COVID-19. Medicines that treat other diseases are being used on a trial basis to see if they are effective against COVID-19. Your health care provider will talk with you about ways to treat your symptoms. For most people, the infection is mild and can be managed at home with rest, fluids, and over-the-counter medicines. Treatment for a serious infection usually takes places in a hospital intensive care unit (ICU). It may include one or more of the following treatments. These treatments are given until your symptoms improve.  Receiving fluids and medicines through an IV.  Supplemental oxygen. Extra oxygen is given through a tube in the nose, a face mask, or a hood.  Positioning you to lie on your stomach (prone position). This makes it easier for oxygen to get into the lungs.  Continuous positive airway pressure (CPAP) or bi-level positive airway pressure (BPAP) machine. This treatment uses mild air pressure to keep the airways open. A tube that is connected to a motor delivers oxygen to the body.  Ventilator. This treatment moves air into and out of the lungs by using a tube that is placed in your windpipe.  Tracheostomy. This is a procedure to create a hole in the neck so that a breathing tube can be inserted.  Extracorporeal membrane oxygenation (ECMO). This procedure gives the lungs a chance to recover by taking over the functions of the heart and lungs. It supplies oxygen to the body and removes carbon dioxide. Follow these instructions at home: Lifestyle  If you are sick, stay home except to get medical care. Your health care provider will tell you how long to stay home. Call your health care provider before you go for medical care.  Rest at home as told by your health care provider.  Do not use any products that contain nicotine or tobacco, such as cigarettes, e-cigarettes, and  chewing tobacco. If you need help quitting, ask your health care provider.  Return to your normal activities as told by your health care provider. Ask your health care provider what activities are safe for you. General instructions  Take over-the-counter and prescription medicines only as told by your health care provider.  Drink enough fluid to keep your urine pale yellow.  Keep all follow-up visits as told by your health care provider. This is important. How is this prevented?  There is no vaccine to help prevent COVID-19 infection. However, there are steps you can take to protect yourself and others from this virus. To protect yourself:   Do not travel to areas where COVID-19 is a risk. The areas where COVID-19 is reported change often. To identify high-risk areas and travel restrictions, check the CDC travel website: wwwnc.cdc.gov/travel/notices  If you live in, or must travel to, an area where COVID-19 is a risk, take precautions to avoid infection. ? Stay away from people who are sick. ? Wash your hands often with soap and water for 20 seconds. If soap and water   are not available, use an alcohol-based hand sanitizer. ? Avoid touching your mouth, face, eyes, or nose. ? Avoid going out in public, follow guidance from your state and local health authorities. ? If you must go out in public, wear a cloth face covering or face mask. Make sure your mask covers your nose and mouth. ? Avoid crowded indoor spaces. Stay at least 6 feet (2 meters) away from others. ? Disinfect objects and surfaces that are frequently touched every day. This may include:  Counters and tables.  Doorknobs and light switches.  Sinks and faucets.  Electronics, such as phones, remote controls, keyboards, computers, and tablets. To protect others: If you have symptoms of COVID-19, take steps to prevent the virus from spreading to others.  If you think you have a COVID-19 infection, contact your health care  provider right away. Tell your health care team that you think you may have a COVID-19 infection.  Stay home. Leave your house only to seek medical care. Do not use public transport.  Do not travel while you are sick.  Wash your hands often with soap and water for 20 seconds. If soap and water are not available, use alcohol-based hand sanitizer.  Stay away from other members of your household. Let healthy household members care for children and pets, if possible. If you have to care for children or pets, wash your hands often and wear a mask. If possible, stay in your own room, separate from others. Use a different bathroom.  Make sure that all people in your household wash their hands well and often.  Cough or sneeze into a tissue or your sleeve or elbow. Do not cough or sneeze into your hand or into the air.  Wear a cloth face covering or face mask. Make sure your mask covers your nose and mouth. Where to find more information  Centers for Disease Control and Prevention: www.cdc.gov/coronavirus/2019-ncov/index.html  World Health Organization: www.who.int/health-topics/coronavirus Contact a health care provider if:  You live in or have traveled to an area where COVID-19 is a risk and you have symptoms of the infection.  You have had contact with someone who has COVID-19 and you have symptoms of the infection. Get help right away if:  You have trouble breathing.  You have pain or pressure in your chest.  You have confusion.  You have bluish lips and fingernails.  You have difficulty waking from sleep.  You have symptoms that get worse. These symptoms may represent a serious problem that is an emergency. Do not wait to see if the symptoms will go away. Get medical help right away. Call your local emergency services (911 in the [place] Do not drive yourself to the hospital. Let the emergency medical personnel know if you think you have COVID-19. Summary  COVID-19 is a  respiratory infection that is caused by a virus. It is also known as coronavirus disease or novel coronavirus. It can cause serious infections, such as pneumonia, acute respiratory distress syndrome, acute respiratory failure, or sepsis.  The virus that causes COVID-19 is contagious. This means that it can spread from person to person through droplets from breathing, speaking, singing, coughing, or sneezing.  You are more likely to develop a serious illness if you are 50 years of age or older, have a weak immune system, live in a nursing home, or have chronic disease.  There is no medicine to treat COVID-19. Your health care provider will talk with you about ways to treat your symptoms.    Take steps to protect yourself and others from infection. Wash your hands often and disinfect objects and surfaces that are frequently touched every day. Stay away from people who are sick and wear a mask if you are sick. This information is not intended to replace advice given to you by your health care provider. Make sure you discuss any questions you have with your health care provider. Document Revised: 07/31/2019 Document Reviewed: 11/06/2018 Elsevier Patient Education  2020 [employer]  

## 2019-12-13 NOTE — Progress Notes (Signed)
  Diagnosis: COVID-19  Physician: Dr. Joya Gaskins  Procedure: [facility] Med: remdesivir infusion.  Complications: No immediate complications noted.  Discharge: Discharged home   Janine Ores 12/13/2019

## 2019-12-14 ENCOUNTER — Telehealth: Payer: Self-pay

## 2019-12-14 ENCOUNTER — Other Ambulatory Visit: Payer: Self-pay

## 2019-12-14 LAB — CULTURE, BLOOD (ROUTINE X 2)
Culture: NO GROWTH
Culture: NO GROWTH
Special Requests: ADEQUATE
Special Requests: ADEQUATE

## 2019-12-14 NOTE — Telephone Encounter (Signed)
New message    The patient is been seen today to insinuate home health orders, patient refuses home health this weekend  per hospital discharger orders.

## 2019-12-14 NOTE — Patient Outreach (Signed)
[facility]) Care Management  12/14/2019  [name] 07-21-1956 [id number]     Transition of Care Referral  Referral Date: 12/14/2019 Referral Source: [facility] Discharge Report Date of Discharge: 12/11/2019 Facility: [place]: [facility]   Referral received. Transition of care calls being completed via EMMI-automated calls. RN CM will outreach patient for any red flags received.    Plan: RN CM will close case at this time.    Enzo Montgomery, RN,BSN,CCM [facility] Telephonic Care Management Coordinator Direct Phone: [phone] Toll Free: [phone] Fax: [phone]

## 2019-12-14 NOTE — Telephone Encounter (Signed)
FYI

## 2019-12-15 ENCOUNTER — Telehealth: Payer: Self-pay

## 2019-12-15 NOTE — Telephone Encounter (Signed)
Left message for patient to remind of missed remote transmission.  

## 2019-12-15 NOTE — Telephone Encounter (Signed)
Attempted return call to patient as requested by voice mail message.  He reports he is not at home but the monitor and unable to send report.  He has been sick with COVID 19 but I recovering.  Left voice mail message requesting to send a remote transmission to review fluid levels.

## 2019-12-17 ENCOUNTER — Other Ambulatory Visit: Payer: Self-pay | Admitting: Internal Medicine

## 2019-12-18 NOTE — Telephone Encounter (Signed)
Patient returned call and reports he does not have his monitor with him since he is staying at a friends house.  Advised will reschedule ICM remote transmission for 12/28/19 and he said that would be fine.

## 2019-12-21 ENCOUNTER — Ambulatory Visit: Payer: Medicare HMO | Admitting: Family Medicine

## 2019-12-21 ENCOUNTER — Ambulatory Visit (INDEPENDENT_AMBULATORY_CARE_PROVIDER_SITE_OTHER): Payer: Medicare HMO

## 2019-12-21 ENCOUNTER — Encounter: Payer: Self-pay | Admitting: Family Medicine

## 2019-12-21 ENCOUNTER — Other Ambulatory Visit: Payer: Self-pay

## 2019-12-21 VITALS — HR 74 | Ht 70.0 in | Wt 245.0 lb

## 2019-12-21 DIAGNOSIS — T8484XD Pain due to internal orthopedic prosthetic devices, implants and grafts, subsequent encounter: Secondary | ICD-10-CM | POA: Diagnosis not present

## 2019-12-21 DIAGNOSIS — G8929 Other chronic pain: Secondary | ICD-10-CM

## 2019-12-21 DIAGNOSIS — R930 Abnormal findings on diagnostic imaging of skull and head, not elsewhere classified: Secondary | ICD-10-CM | POA: Diagnosis not present

## 2019-12-21 DIAGNOSIS — M25561 Pain in right knee: Secondary | ICD-10-CM | POA: Diagnosis not present

## 2019-12-21 DIAGNOSIS — M25512 Pain in left shoulder: Secondary | ICD-10-CM | POA: Diagnosis not present

## 2019-12-21 DIAGNOSIS — Z96651 Presence of right artificial knee joint: Secondary | ICD-10-CM | POA: Diagnosis not present

## 2019-12-21 DIAGNOSIS — M1712 Unilateral primary osteoarthritis, left knee: Secondary | ICD-10-CM | POA: Diagnosis not present

## 2019-12-21 DIAGNOSIS — M12812 Other specific arthropathies, not elsewhere classified, left shoulder: Secondary | ICD-10-CM

## 2019-12-21 DIAGNOSIS — S82031A Displaced transverse fracture of right patella, initial encounter for closed fracture: Secondary | ICD-10-CM | POA: Diagnosis not present

## 2019-12-21 NOTE — Assessment & Plan Note (Signed)
Has had difficulty before.  X-rays ordered today.  We will get a x-ray and likely a bone scan first.  Will need custom bracing on a more regular basis.  Patient would consider the possibility of a revision at this point

## 2019-12-21 NOTE — Assessment & Plan Note (Signed)
Injection given, discussed icing regimen and home exercise, which activities to do which wants to avoid.  Increase activity slowly over the course the next several days.  Chronic problem with an exacerbation.  Social determinants of health include patient multiple other comorbidities and makes it difficult to do any significant formal physical therapy at the moment.  Follow-up again in 8 to 10 weeks

## 2019-12-21 NOTE — Patient Instructions (Addendum)
Injected shoulder and knee today Xray for right knee Bone scan See me again in 8-10 weeks

## 2019-12-21 NOTE — Progress Notes (Signed)
[place] Phone: [phone] Subjective:   [name], am serving as a scribe for Dr. Hulan Saas. This visit occurred during the SARS-CoV-2 public health emergency.  Safety protocols were in place, including screening questions prior to the visit, additional usage of staff PPE, and extensive cleaning of exam room while observing appropriate contact time as indicated for disinfecting solutions.   I'm seeing this patient by the request  of:  [name], MD  CC: Bilateral knee pain with right-sided replacement, left shoulder pain  [id number]   09/23/2019 Repeat injection given today, discussed icing regimen and home exercise, discussed avoiding certain activities.  Patient to increase activity slowly over the course the next several weeks.  Follow-up again 8 to 10 weeks  Patient given injection, tolerated procedure well, discussed icing regimen and home exercise, patient will see me again in 10 weeks for further evaluation and treatment.  Update 12/21/2019 [name] is a 64 y.o. male coming in with complaint of left knee and left shoulder pain. Patient states he had COVID and both joints are bothering him due to taking breathing treatments and having issues with is heart.   Patient is also having right knee pain. Had TKE 6 years ago.        Past Medical History:  Diagnosis Date  . AICD (automatic cardioverter/defibrillator) present   . Anemia    supposed to be taking Vit B but doesn't  . ANXIETY    takes Xanax nightly  . Arthritis   . Asthma    Albuterol prn and Advair daily;also takes Prednisone daily  . Atrial fibrillation ([place]) 09/06/2015  . Cardiomyopathy [facility])    a. EF 25% TEE July 2013; b. EF normalized 2015;  c. 03/2015 Echo: EF 40-45%, difrf HK, PASP 38 mmHg, Mild MR, sev LAE/RAE.  [name] Chronic constipation    takes OTC stool softener  . COPD (chronic obstructive pulmonary disease)  ([place])    "one dr says COPD; one dr says emphysema" (09/18/2017)  . DEPRESSION    takes Zoloft and Doxepin daily  . Diverticulitis   . DYSKINESIA, ESOPHAGUS   . Emphysema of lung [facility])    "one dr says COPD; one dr says emphysema" (09/18/2017)  . Essential hypertension       . FIBROMYALGIA   . GERD (gastroesophageal reflux disease)       . Glaucoma   . HYPERLIPIDEMIA    a. Intolerant to statins.  . INSOMNIA    takes Ambien nightly  . Myocardial infarction [facility])    a. 2012 Myoview notable for prior infarct;  b. 03/2015 Lexiscan CL: EF 37%, diff HK, small area of inferior infarct from apex to base-->Med Rx.  . Myocardial infarction ([place])   . O2 dependent    "2.5L q hs & prn" (09/18/2017)  . Paroxysmal atrial fibrillation ([facility])    a. CHA2DS2VASc = 3--> takes Coumadin;  b. 03/15/2015 Successful TEE/DCCV;  c. 03/2015 recurrent afib, Amio d/c'd in setting of hyperthyroidism.  . Peripheral neuropathy   . Pneumonia 12/2016  . Rash and other nonspecific skin eruption 04/12/2009   [name] cause found saw dermatologists x 2 and allergist  . SLEEP APNEA, OBSTRUCTIVE    a. doesn't use CPAP  . Syncope    a. 03/2015 s/p MDT LINQ.  [name] Type II diabetes mellitus ([place])        Past Surgical History:  Procedure Laterality Date  . ACNE CYST  REMOVAL     2 on back   . AV NODE ABLATION N/A 10/25/2017   Procedure: AV NODE ABLATION;  Surgeon: Deboraha Sprang, MD;  Location: [place] CV LAB;  Service: Cardiovascular;  Laterality: N/A;  . BIV ICD INSERTION CRT-D N/A 09/18/2017   Procedure: BIV ICD INSERTION CRT-D;  Surgeon: Deboraha Sprang, MD;  Location: [place] CV LAB;  Service: Cardiovascular;  Laterality: N/A;  . CARDIAC CATHETERIZATION N/A 03/21/2016   Procedure: Right/Left Heart Cath and Coronary Angiography;  Surgeon: Larey Dresser, MD;  Location: [place] CV LAB;  Service: Cardiovascular;  Laterality: N/A;  . CARDIOVERSION  04/18/2012   Procedure: CARDIOVERSION;  Surgeon: Fay Records, MD;  Location:  [place];  Service: Cardiovascular;  Laterality: N/A;  . CARDIOVERSION  04/25/2012   Procedure: CARDIOVERSION;  Surgeon: Thayer Headings, MD;  Location: [place];  Service: Cardiovascular;  Laterality: N/A;  . CARDIOVERSION  04/25/2012   Procedure: CARDIOVERSION;  Surgeon: Fay Records, MD;  Location: [place];  Service: Cardiovascular;  Laterality: N/A;  . CARDIOVERSION  05/09/2012   Procedure: CARDIOVERSION;  Surgeon: Sherren Mocha, MD;  Location: [place];  Service: Cardiovascular;  Laterality: N/A;  changed from crenshaw to cooper by trish/leone-endo  . CARDIOVERSION N/A 03/15/2015   Procedure: CARDIOVERSION;  Surgeon: Thayer Headings, MD;  Location: [facility] ENDOSCOPY;  Service: Cardiovascular;  Laterality: N/A;  . COLONOSCOPY    . COLONOSCOPY WITH PROPOFOL N/A 10/21/2014   Procedure: COLONOSCOPY WITH PROPOFOL;  Surgeon: Ladene Artist, MD;  Location: [facility] ENDOSCOPY;  Service: Endoscopy;  Laterality: N/A;  . EP IMPLANTABLE DEVICE N/A 04/06/2015   Procedure: Loop Recorder Insertion;  Surgeon: Evans Lance, MD;  Location: [place] CV LAB;  Service: Cardiovascular;  Laterality: N/A;  . ESOPHAGOGASTRODUODENOSCOPY    . JOINT REPLACEMENT    . LOOP RECORDER REMOVAL N/A 09/18/2017   Procedure: LOOP RECORDER REMOVAL;  Surgeon: Deboraha Sprang, MD;  Location: [place] CV LAB;  Service: Cardiovascular;  Laterality: N/A;  . RIGHT/LEFT HEART CATH AND CORONARY ANGIOGRAPHY N/A 01/28/2017   Procedure: Right/Left Heart Cath and Coronary Angiography;  Surgeon: Larey Dresser, MD;  Location: [place] CV LAB;  Service: Cardiovascular;  Laterality: N/A;  . TEE WITHOUT CARDIOVERSION  04/25/2012   Procedure: TRANSESOPHAGEAL ECHOCARDIOGRAM (TEE);  Surgeon: Thayer Headings, MD;  Location: [place];  Service: Cardiovascular;  Laterality: N/A;  . TEE WITHOUT CARDIOVERSION N/A 03/15/2015   Procedure: TRANSESOPHAGEAL ECHOCARDIOGRAM (TEE);  Surgeon: Thayer Headings, MD;  Location: [place];  Service: Cardiovascular;   Laterality: N/A;  . TONSILLECTOMY AND ADENOIDECTOMY    . TOTAL KNEE ARTHROPLASTY Right 06/15/2014   Procedure: TOTAL KNEE ARTHROPLASTY;  Surgeon: Renette Butters, MD;  Location: [place];  Service: Orthopedics;  Laterality: Right;   Social History   Socioeconomic History  . Marital status: Divorced    Spouse name: Not on file  . Number of children: 2  . Years of education: Not on file  . Highest education level: Not on file  Occupational History  . Occupation: retired/disabled. prev worked in Therapist, sports.    Employer: DISABLED  Tobacco Use  . Smoking status: Former Smoker    Packs/day: 2.00    Years: 30.00    Pack years: 60.00    Types: Cigarettes    Quit date: 10/16/2007    Years since quitting: 12.1  . Smokeless tobacco: Never Used  Substance and Sexual Activity  . Alcohol use: [name]  . Drug  use: [name]  . Sexual activity: Not Currently  Other Topics Concern  . Not on file  Social History Narrative   Lives alone.   Social Determinants of Health   Financial Resource Strain:   . Difficulty of Paying Living Expenses: Not on file  Food Insecurity:   . Worried About Charity fundraiser in the Last Year: Not on file  . Ran Out of Food in the Last Year: Not on file  Transportation Needs:   . Lack of Transportation (Medical): Not on file  . Lack of Transportation (Non-Medical): Not on file  Physical Activity:   . Days of Exercise per Week: Not on file  . Minutes of Exercise per Session: Not on file  Stress:   . Feeling of Stress : Not on file  Social Connections:   . Frequency of Communication with Friends and Family: Not on file  . Frequency of Social Gatherings with Friends and Family: Not on file  . Attends Religious Services: Not on file  . Active Member of Clubs or Organizations: Not on file  . Attends Archivist Meetings: Not on file  . Marital Status: Not on file   Allergies  Allergen Reactions  . Amiodarone Other (See Comments)    hyperthyroidism  .  Statins Other (See Comments)    myalgia  . Tape Other (See Comments)    Skin Tears Use Paper Tape Only   Family History  Problem Relation Age of Onset  . COPD Mother   . Asthma Mother   . Colon polyps Mother   . Allergies Mother   . Hypothyroidism Mother   . Asthma Maternal Grandmother   . Colon cancer Neg Hx     Current Outpatient Medications (Endocrine & Metabolic):  .  predniSONE (DELTASONE) 10 MG tablet, Take 40 mg daily for 1 day, 30 mg daily for 1 day, 20 mg daily for 1 days,10 mg daily for 1 day, then stop  Current Outpatient Medications (Cardiovascular):  .  carvedilol (COREG) 12.5 MG tablet, TAKE 1 TABLET (12.5 MG TOTAL) BY MOUTH 2 (TWO) TIMES DAILY WITH A MEAL. [name]  digoxin (LANOXIN) 0.25 MG tablet, TAKE 1/2 TABLET BY MOUTH EVERY DAY .  rosuvastatin (CRESTOR) 5 MG tablet, Take 1 tablet (5 mg total) by mouth daily. [name]  spironolactone (ALDACTONE) 25 MG tablet, TAKE 1 TABLET BY MOUTH EVERY DAY (Patient taking differently: Take 25 mg by mouth daily. ) .  torsemide (DEMADEX) 20 MG tablet, TAKE 2 TABLETS BY MOUTH TWICE A DAY (Patient taking differently: Take 40 mg by mouth 2 (two) times daily. )  Current Outpatient Medications (Respiratory):  .  albuterol (VENTOLIN HFA) 108 (90 Base) MCG/ACT inhaler, INHALE 2 PUFFS 4 TIMES A DAY as needed (Patient taking differently: Inhale 2 puffs into the lungs every 6 (six) hours as needed for wheezing or shortness of breath. INHALE 2 PUFFS 4 TIMES A DAY as needed) .  benzonatate (TESSALON) 100 MG capsule, Take 1-2 capsules (100-200 mg total) by mouth 3 (three) times daily as needed for cough. .  fluticasone (FLONASE) 50 MCG/ACT nasal spray, PLACE 2 SPRAYS INTO BOTH NOSTRILS DAILY AS NEEDED FOR ALLERGIES OR RHINITIS. [name]  Fluticasone-Salmeterol (ADVAIR DISKUS) 250-50 MCG/DOSE AEPB, Inhale 1 puff into the lungs 2 (two) times daily.  Current Outpatient Medications (Analgesics):  .  acetaminophen (TYLENOL) 500 MG tablet, Take 500 mg by mouth every 6  (six) hours as needed. .  traMADol (ULTRAM) 50 MG tablet, Take 1 tablet (50 mg  total) by mouth daily as needed.  Current Outpatient Medications (Hematological):  .  apixaban (ELIQUIS) 5 MG TABS tablet, Take 1 tablet (5 mg total) by mouth 2 (two) times daily.  Current Outpatient Medications (Other):  [name]  ALPRAZolam (XANAX) 0.5 MG tablet, TAKE 2 TABLETS BY MOUTH AT BEDTIME (Patient taking differently: Take 1 mg by mouth at bedtime. TAKE 2 TABLETS BY MOUTH AT BEDTIME) .  carisoprodol (SOMA) 350 MG tablet, TAKE 1 TABLET BY MOUTH THREE TIMES A DAY AS NEEDED FOR MUSCLE SPASMS (Patient taking differently: Take 350 mg by mouth 3 (three) times daily as needed for muscle spasms. ) .  doxepin (SINEQUAN) 10 MG capsule, TAKE 2 CAPSULES BY MOUTH AT BEDTIME. ANNUAL APPT DUE IN JUNE MUST SEE PROVIDER FOR FUTURE REFILLS (Patient taking differently: Take 20 mg by mouth at bedtime. ) .  hydrocortisone 2.5 % cream, APPLY TO AFFECTED AREA TWICE A DAY (Patient taking differently: Apply 1 application topically 2 (two) times daily. ) .  hydrOXYzine (ATARAX/VISTARIL) 10 MG tablet, TAKE 1 TABLET BY MOUTH THREE TIMES A DAY AS NEEDED (Patient taking differently: Take 10 mg by mouth 3 (three) times daily as needed for itching. ) .  methotrexate (RHEUMATREX) 2.5 MG tablet, TAKE 4 TABLETS BY MOUTH EVERY WEEK (Patient taking differently: Take 10 mg by mouth once a week. TAKE 4 TABLETS BY MOUTH EVERY WEEK on Sunday) .  omeprazole (PRILOSEC) 20 MG capsule, Take 1 capsule (20 mg total) by mouth daily with supper. .  silver sulfADIAZINE (SILVADENE) 1 % cream, APPLY TOPICALLY TO AFFECTED AREA EVERY DAY (Patient taking differently: Apply 1 application topically daily as needed (irritated skin). ) .  traZODone (DESYREL) 50 MG tablet, TAKE 1 TABLET (50 MG TOTAL) BY MOUTH AT BEDTIME AS NEEDED FOR SLEEP. [name]  triamcinolone (KENALOG) 0.1 % paste, APPLY 1 APPLICATION IN THE MOUTH OR THROAT 2 (TWO) TIMES DAILY AS DIRECTED (Patient taking  differently: Use as directed 1 application in the mouth or throat 2 (two) times daily as needed (for blisters). ) .  TUMS 500 MG chewable tablet, Chew 500-2,000 mg by mouth every 4 (four) hours as needed for indigestion or heartburn.  .  valACYclovir (VALTREX) 1000 MG tablet, TAKE 1 TABLET BY MOUTH THREE TIMES A DAY (Patient taking differently: Take 1,000 mg by mouth 3 (three) times daily as needed (cold sores). ) .  venlafaxine XR (EFFEXOR-XR) 37.5 MG 24 hr capsule, TAKE ONE CAPSULE BY MOUTH EVERY DAY WITH BREAKFAST (Patient taking differently: Take 37.5 mg by mouth daily. TAKE ONE CAPSULE BY MOUTH EVERY DAY WITH BREAKFAST) .  zolpidem (AMBIEN) 10 MG tablet, TAKE 1 TABLET BY MOUTH EVERYDAY AT BEDTIME (Patient taking differently: Take 10 mg by mouth at bedtime. TAKE 1 TABLET BY MOUTH EVERYDAY AT BEDTIME)   Reviewed prior external information including notes and imaging from  primary care provider As well as notes that were available from care everywhere and other healthcare systems.  Past medical history, social, surgical and family history all reviewed in electronic medical record.  [name] pertanent information unless stated regarding to the chief complaint.   Review of Systems:  [name] headache, visual changes, nausea, vomiting, diarrhea, constipation, dizziness, abdominal pain, skin rash, fevers, chills, night sweats, weight loss, swollen lymph nodes,  joint swelling, chest pain, shortness of breath, mood changes. POSITIVE muscle aches, body aches  Objective  Pulse 74, height 5\' 10"  (1.778 m), weight 245 lb (111.1 kg), SpO2 98 %.   General: [name] apparent distress alert and oriented  x3 mood and affect normal, dressed appropriately.  HEENT: Pupils equal, extraocular movements intact  Respiratory: Patient's speak in full sentences and does not appear short of breath  Cardiovascular: [name] lower extremity edema, non tender, [name] erythema  Skin: Warm dry intact with [name] signs of infection or rash on extremities  or on axial skeleton.  Abdomen: Soft nontender  Neuro: Cranial nerves II through XII are intact, neurovascularly intact in all extremities with 2+ DTRs and 2+ pulses.  Lymph: [name] lymphadenopathy of posterior or anterior cervical chain or axillae bilaterally.  Gait normal with good balance and coordination.  MSK:  tender with limited range of motion and good stability and symmetric strength and tone of  elbows, wrist, hip and ankles bilaterally.  Left shoulder exam shows some atrophy noted.  Patient does have crepitus noted with range of motion.  3-5 strength of rotator cuff compared to the contralateral side.  Knee: Left valgus deformity noted.  Abnormal thigh to calf ratio.  Tender to palpation over medial and PF joint line.  ROM full in flexion and extension and lower leg rotation. instability with valgus force.  painful patellar compression. Patellar glide with moderate crepitus. Patellar and quadriceps tendons unremarkable. Hamstring and quadriceps strength is normal. Contralateral knee shows replacement the patient does have what appears to be instability of the patella component.  Procedure: Real-time Ultrasound Guided Injection of left glenohumeral joint Device: GE Logiq E  Ultrasound guided injection is preferred based studies that show increased duration, increased effect, greater accuracy, decreased procedural pain, increased response rate with ultrasound guided versus blind injection.  Verbal informed consent obtained.  Time-out conducted.  Noted [name] overlying erythema, induration, or other signs of local infection.  Skin prepped in a sterile fashion.  Local anesthesia: Topical Ethyl chloride.  With sterile technique and under real time ultrasound guidance:  Joint visualized.  21g 2 inch needle inserted posterior approach. Pictures taken for needle placement. Patient did have injection of 2 cc of 0.5% Marcaine, and 1cc of Kenalog 40 mg/dL. Completed without difficulty  Pain  immediately resolved suggesting accurate placement of the medication.  Advised to call if fevers/chills, erythema, induration, drainage, or persistent bleeding.  Images permanently stored and available for review in the ultrasound unit.  Impression: Technically successful ultrasound guided injection.  After informed written and verbal consent, patient was seated on exam table. Left knee was prepped with alcohol swab and utilizing anterolateral approach, patient's left knee space was injected with 4:1  marcaine 0.5%: Kenalog 40mg /dL. Patient tolerated the procedure well without immediate complications.   Impression and Recommendations:     This case required medical decision making of moderate complexity. The above documentation has been reviewed and is accurate and complete Lyndal Pulley, DO       Note: This dictation was prepared with Dragon dictation along with smaller phrase technology. Any transcriptional errors that result from this process are unintentional.

## 2019-12-21 NOTE — Assessment & Plan Note (Signed)
Patient given injection today.  Tolerated procedure well.  Can repeat definitive is necessary.  Patient is agreement with plan.  Follow-up again in 10 to 12 weeks chronic problem with exacerbation

## 2019-12-22 ENCOUNTER — Telehealth: Payer: Self-pay

## 2019-12-22 ENCOUNTER — Emergency Department (HOSPITAL_COMMUNITY): Payer: Medicare HMO

## 2019-12-22 ENCOUNTER — Emergency Department (HOSPITAL_COMMUNITY)
Admission: EM | Admit: 2019-12-22 | Discharge: 2019-12-22 | Discharge status: Against Medical Advice | Payer: Medicare HMO | Attending: Emergency Medicine | Admitting: Emergency Medicine

## 2019-12-22 ENCOUNTER — Encounter (HOSPITAL_COMMUNITY): Payer: Self-pay | Admitting: Emergency Medicine

## 2019-12-22 DIAGNOSIS — E119 Type 2 diabetes mellitus without complications: Secondary | ICD-10-CM | POA: Insufficient documentation

## 2019-12-22 DIAGNOSIS — I428 Other cardiomyopathies: Secondary | ICD-10-CM

## 2019-12-22 DIAGNOSIS — I4819 Other persistent atrial fibrillation: Secondary | ICD-10-CM | POA: Diagnosis not present

## 2019-12-22 DIAGNOSIS — I110 Hypertensive heart disease with heart failure: Secondary | ICD-10-CM | POA: Insufficient documentation

## 2019-12-22 DIAGNOSIS — Z7984 Long term (current) use of oral hypoglycemic drugs: Secondary | ICD-10-CM | POA: Insufficient documentation

## 2019-12-22 DIAGNOSIS — J449 Chronic obstructive pulmonary disease, unspecified: Secondary | ICD-10-CM | POA: Insufficient documentation

## 2019-12-22 DIAGNOSIS — Z7901 Long term (current) use of anticoagulants: Secondary | ICD-10-CM | POA: Diagnosis not present

## 2019-12-22 DIAGNOSIS — Z8616 Personal history of COVID-19: Secondary | ICD-10-CM | POA: Insufficient documentation

## 2019-12-22 DIAGNOSIS — I509 Heart failure, unspecified: Secondary | ICD-10-CM | POA: Diagnosis not present

## 2019-12-22 DIAGNOSIS — R0789 Other chest pain: Secondary | ICD-10-CM | POA: Diagnosis not present

## 2019-12-22 DIAGNOSIS — Z79899 Other long term (current) drug therapy: Secondary | ICD-10-CM | POA: Insufficient documentation

## 2019-12-22 DIAGNOSIS — R0602 Shortness of breath: Secondary | ICD-10-CM | POA: Diagnosis not present

## 2019-12-22 DIAGNOSIS — R072 Precordial pain: Secondary | ICD-10-CM

## 2019-12-22 LAB — COMPREHENSIVE METABOLIC PANEL
ALT: 34 U/L (ref 0–44)
AST: 24 U/L (ref 15–41)
Albumin: 3.2 g/dL — ABNORMAL LOW (ref 3.5–5.0)
Alkaline Phosphatase: 44 U/L (ref 38–126)
Anion gap: 7 (ref 5–15)
BUN: 24 mg/dL — ABNORMAL HIGH (ref 8–23)
CO2: 19 mmol/L — ABNORMAL LOW (ref 22–32)
Calcium: 9.4 mg/dL (ref 8.9–10.3)
Chloride: 113 mmol/L — ABNORMAL HIGH (ref 98–111)
Creatinine, Ser: 1.08 mg/dL (ref 0.61–1.24)
GFR calc Af Amer: >60 mL/min (ref >60)
GFR calc non Af Amer: >60 mL/min (ref >60)
Glucose, Bld: 101 mg/dL — ABNORMAL HIGH (ref 70–99)
Potassium: 4 mmol/L (ref 3.5–5.1)
Sodium: 139 mmol/L (ref 135–145)
Total Bilirubin: 0.8 mg/dL (ref 0.3–1.2)
Total Protein: 5.9 g/dL — ABNORMAL LOW (ref 6.5–8.1)

## 2019-12-22 LAB — CBC
HCT: 40.3 % (ref 39.0–52.0)
Hemoglobin: 13.1 g/dL (ref 13.0–17.0)
MCH: 34.4 pg — ABNORMAL HIGH (ref 26.0–34.0)
MCHC: 32.5 g/dL (ref 30.0–36.0)
MCV: 105.8 fL — ABNORMAL HIGH (ref 80.0–100.0)
Platelets: 190 10*3/uL (ref 150–400)
RBC: 3.81 MIL/uL — ABNORMAL LOW (ref 4.22–5.81)
RDW: 13.9 % (ref 11.5–15.5)
WBC: 11.2 10*3/uL — ABNORMAL HIGH (ref 4.0–10.5)
nRBC: 0 % (ref 0.0–0.2)

## 2019-12-22 LAB — TROPONIN I (HIGH SENSITIVITY): Troponin I (High Sensitivity): 10 ng/L (ref <18)

## 2019-12-22 LAB — BRAIN NATRIURETIC PEPTIDE: B Natriuretic Peptide: 119.8 pg/mL — ABNORMAL HIGH (ref 0.0–100.0)

## 2019-12-22 NOTE — ED Triage Notes (Signed)
Pt reports being released here last week for covid with PNA. Tested + for covid 2/12. States his PCP sent him here due to SOB and shaking.

## 2019-12-22 NOTE — ED Notes (Signed)
Pt removed monitor equipment. States he "is going home." Pt refusing any additional lab work or treatments at this time. MD aware. AMA form signed.

## 2019-12-22 NOTE — Telephone Encounter (Signed)
Patient called and spoke with [facility] on 12/22/2019 8:49:28 AM and states he was diagnosed with COVID 3-4 weeks ago. Admitted to the hospital early last week. He has been having SOB since he was discharged last week. He is also tied and having trouble sleeping because of the SOB. He feels shaky. Albuterol BID.   Advised to go to ED now.

## 2019-12-22 NOTE — Discharge Instructions (Signed)
It was our pleasure to provide your ER care today - we hope that you feel better.  Return if reconsider and wish to wait for additional testing to be done.   Follow up with your cardiologist in the next few days - call today to arrange follow up appointment.  Return to ER right away if worse, new symptoms, fevers, recurrent or persistent chest pain, increased trouble breathing, weak/faint, or other concern.

## 2019-12-22 NOTE — ED Provider Notes (Addendum)
[facility] EMERGENCY DEPARTMENT Provider Note   CSN: [id number] Arrival date & time: 12/22/19  1011     History Chief Complaint  Patient presents with  . Shortness of Breath    DERMOT REVERS is a 64 y.o. male.  Patient with hx chf/CM, covid infection 11/2019, c/o sob for the past few days. Symptoms gradual onset, moderate, constant, persistent, worse w exertion and lying flat. No increased leg edema. Long standing orthopnea, sleeps in recliner at baseline, no pnd. Occasional non prod cough, but improved from when was hospitalized with covid. States post covid breathing never quite returned to prior baseline. Occasional feels tightness in mid chest, at rest, not related to activity or exertion. No pleuritic pain. No calf pain/swelling. Indicates has chronic bil knee pain, no recent change. No hx dvt or pe, and cta chest during recent hospital stay neg for PE. Indicates is compliant w home meds, urinating normal amount.   The history is provided by the patient.  Shortness of Breath Associated symptoms: chest pain   Associated symptoms: no abdominal pain, no fever, no headaches, no neck pain, no rash, no sore throat and no vomiting        Past Medical History:  Diagnosis Date  . AICD (automatic cardioverter/defibrillator) present   . Anemia    supposed to be taking Vit B but doesn't  . ANXIETY    takes Xanax nightly  . Arthritis   . Asthma    Albuterol prn and Advair daily;also takes Prednisone daily  . Atrial fibrillation ([place]) 09/06/2015  . Cardiomyopathy [facility])    a. EF 25% TEE July 2013; b. EF normalized 2015;  c. 03/2015 Echo: EF 40-45%, difrf HK, PASP 38 mmHg, Mild MR, sev LAE/RAE.  [name] Chronic constipation    takes OTC stool softener  . COPD (chronic obstructive pulmonary disease) ([place])    "one dr says COPD; one dr says emphysema" (09/18/2017)  . DEPRESSION    takes Zoloft and Doxepin daily  . Diverticulitis   . DYSKINESIA, ESOPHAGUS   . Emphysema of  lung [facility])    "one dr says COPD; one dr says emphysema" (09/18/2017)  . Essential hypertension       . FIBROMYALGIA   . GERD (gastroesophageal reflux disease)       . Glaucoma   . HYPERLIPIDEMIA    a. Intolerant to statins.  . INSOMNIA    takes Ambien nightly  . Myocardial infarction [facility])    a. 2012 Myoview notable for prior infarct;  b. 03/2015 Lexiscan CL: EF 37%, diff HK, small area of inferior infarct from apex to base-->Med Rx.  . Myocardial infarction ([place])   . O2 dependent    "2.5L q hs & prn" (09/18/2017)  . Paroxysmal atrial fibrillation (H[facility])    a. CHA2DS2VASc = 3--> takes Coumadin;  b. 03/15/2015 Successful TEE/DCCV;  c. 03/2015 recurrent afib, Amio d/c'd in setting of hyperthyroidism.  . Peripheral neuropathy   . Pneumonia 12/2016  . Rash and other nonspecific skin eruption 04/12/2009   no cause found saw dermatologists x 2 and allergist  . SLEEP APNEA, OBSTRUCTIVE    a. doesn't use CPAP  . Syncope    a. 03/2015 s/p MDT LINQ.  [name] Type II diabetes mellitus [facility])         Patient Active Problem List   Diagnosis Date Noted  . COVID-19 virus infection 12/09/2019  . COVID-19 12/09/2019  . Hypotension 12/09/2019  . Upper respiratory infection 11/27/2019  . COPD  exacerbation ([place]) 11/27/2019  . Elevated PSA 09/23/2019  . Left rotator cuff tear arthropathy 08/06/2019  . Lip lesion 12/23/2018  . Diabetic ulcer of left lower leg with necrosis of muscle ([place]) 12/05/2018  . Chronic bursitis of left shoulder 07/01/2018  . Pain due to total knee replacement ([place]) 03/31/2018  . Rotator cuff arthropathy, left 02/13/2018  . Right rotator cuff tear arthropathy 01/14/2018  . Increased prostate specific antigen (PSA) velocity 09/26/2017  . Trigger point of thoracic region 07/04/2017  . Right lumbar radiculopathy 05/15/2017  . Right leg swelling 05/09/2017  . Glaucoma 04/18/2017  . History of nasal polyposis 02/19/2017  . Chronic rhinitis 02/19/2017  . Microhematuria 12/04/2016    . Preventative health care 11/29/2015  . Type II diabetes mellitus ([place])   . Hyperlipidemia   . Persistent atrial fibrillation ([place]) 03/30/2015  . Syncope 03/30/2015  . Anemia 02/23/2015  . Thyrotoxicosis 02/23/2015  . Hx of adenomatous colonic polyps 10/21/2014  . De Quervain's tenosynovitis, right 04/30/2014  . Osteoarthritis of right knee 02/19/2014  . Encounter for therapeutic drug monitoring 11/17/2013  . Thoracic degenerative disc disease 06/17/2013  . Thoracic spondylosis without myelopathy 02/11/2013  . Lumbosacral spondylosis without myelopathy 02/11/2013  . Degenerative joint disease of knee, left 02/11/2013  . COPD (chronic obstructive pulmonary disease) ([place]) 08/20/2012  . Bundle branch block, right and extreme left anterior fascicular 05/05/2012  . Nonischemic cardiomyopathy ([place]) 05/02/2012  . Anticoagulated on warfarin 03/21/2012  . Spongiotic dermatitis 09/26/2011  . Past myocardial infarction 04/19/2011  . UNSPECIFIED URETHRAL STRICTURE 12/26/2010  . Chronic pain syndrome 05/18/2010  . Obstructive sleep apnea 10/21/2007  . NEPHROLITHIASIS, HX OF 10/21/2007  . Anxiety 06/09/2007  . GERD 06/09/2007  . BENIGN PROSTATIC HYPERTROPHY 06/09/2007  . Morbid obesity ([place]) 06/03/2007  . Depression 06/03/2007  . Essential hypertension 06/03/2007  . INSOMNIA 06/03/2007    Past Surgical History:  Procedure Laterality Date  . ACNE CYST REMOVAL     2 on back   . AV NODE ABLATION N/A 10/25/2017   Procedure: AV NODE ABLATION;  Surgeon: Deboraha Sprang, MD;  Location: [place] CV LAB;  Service: Cardiovascular;  Laterality: N/A;  . BIV ICD INSERTION CRT-D N/A 09/18/2017   Procedure: BIV ICD INSERTION CRT-D;  Surgeon: Deboraha Sprang, MD;  Location: [place] CV LAB;  Service: Cardiovascular;  Laterality: N/A;  . CARDIAC CATHETERIZATION N/A 03/21/2016   Procedure: Right/Left Heart Cath and Coronary Angiography;  Surgeon: Larey Dresser, MD;  Location: [place] CV LAB;   Service: Cardiovascular;  Laterality: N/A;  . CARDIOVERSION  04/18/2012   Procedure: CARDIOVERSION;  Surgeon: Fay Records, MD;  Location: [place];  Service: Cardiovascular;  Laterality: N/A;  . CARDIOVERSION  04/25/2012   Procedure: CARDIOVERSION;  Surgeon: Thayer Headings, MD;  Location: [place];  Service: Cardiovascular;  Laterality: N/A;  . CARDIOVERSION  04/25/2012   Procedure: CARDIOVERSION;  Surgeon: Fay Records, MD;  Location: [place];  Service: Cardiovascular;  Laterality: N/A;  . CARDIOVERSION  05/09/2012   Procedure: CARDIOVERSION;  Surgeon: Sherren Mocha, MD;  Location: [place];  Service: Cardiovascular;  Laterality: N/A;  changed from crenshaw to cooper by trish/leone-endo  . CARDIOVERSION N/A 03/15/2015   Procedure: CARDIOVERSION;  Surgeon: Thayer Headings, MD;  Location: [facility] ENDOSCOPY;  Service: Cardiovascular;  Laterality: N/A;  . COLONOSCOPY    . COLONOSCOPY WITH PROPOFOL N/A 10/21/2014   Procedure: COLONOSCOPY WITH PROPOFOL;  Surgeon: Ladene Artist, MD;  Location: [facility] ENDOSCOPY;  Service:  Endoscopy;  Laterality: N/A;  . EP IMPLANTABLE DEVICE N/A 04/06/2015   Procedure: Loop Recorder Insertion;  Surgeon: Evans Lance, MD;  Location: [place] CV LAB;  Service: Cardiovascular;  Laterality: N/A;  . ESOPHAGOGASTRODUODENOSCOPY    . JOINT REPLACEMENT    . LOOP RECORDER REMOVAL N/A 09/18/2017   Procedure: LOOP RECORDER REMOVAL;  Surgeon: Deboraha Sprang, MD;  Location: [place] CV LAB;  Service: Cardiovascular;  Laterality: N/A;  . RIGHT/LEFT HEART CATH AND CORONARY ANGIOGRAPHY N/A 01/28/2017   Procedure: Right/Left Heart Cath and Coronary Angiography;  Surgeon: Larey Dresser, MD;  Location: [place] CV LAB;  Service: Cardiovascular;  Laterality: N/A;  . TEE WITHOUT CARDIOVERSION  04/25/2012   Procedure: TRANSESOPHAGEAL ECHOCARDIOGRAM (TEE);  Surgeon: Thayer Headings, MD;  Location: [place];  Service: Cardiovascular;  Laterality: N/A;  . TEE WITHOUT CARDIOVERSION N/A  03/15/2015   Procedure: TRANSESOPHAGEAL ECHOCARDIOGRAM (TEE);  Surgeon: Thayer Headings, MD;  Location: [place];  Service: Cardiovascular;  Laterality: N/A;  . TONSILLECTOMY AND ADENOIDECTOMY    . TOTAL KNEE ARTHROPLASTY Right 06/15/2014   Procedure: TOTAL KNEE ARTHROPLASTY;  Surgeon: Renette Butters, MD;  Location: [place];  Service: Orthopedics;  Laterality: Right;       Family History  Problem Relation Age of Onset  . COPD Mother   . Asthma Mother   . Colon polyps Mother   . Allergies Mother   . Hypothyroidism Mother   . Asthma Maternal Grandmother   . Colon cancer Neg Hx     Social History   Tobacco Use  . Smoking status: Former Smoker    Packs/day: 2.00    Years: 30.00    Pack years: 60.00    Types: Cigarettes    Quit date: 10/16/2007    Years since quitting: 12.1  . Smokeless tobacco: Never Used  Substance Use Topics  . Alcohol use: No  . Drug use: No    Home Medications Prior to Admission medications   Medication Sig Start Date End Date Taking? Authorizing Provider  acetaminophen (TYLENOL) 500 MG tablet Take 500 mg by mouth every 6 (six) hours as needed.    [provider]  albuterol (VENTOLIN HFA) 108 (90 Base) MCG/ACT inhaler INHALE 2 PUFFS 4 TIMES A DAY as needed Patient taking differently: Inhale 2 puffs into the lungs every 6 (six) hours as needed for wheezing or shortness of breath. INHALE 2 PUFFS 4 TIMES A DAY as needed 11/27/19   [name], MD  ALPRAZolam Duanne Moron) 0.5 MG tablet TAKE 2 TABLETS BY MOUTH AT BEDTIME Patient taking differently: Take 1 mg by mouth at bedtime. TAKE 2 TABLETS BY MOUTH AT BEDTIME 11/11/19   [name], MD  apixaban (ELIQUIS) 5 MG TABS tablet Take 1 tablet (5 mg total) by mouth 2 (two) times daily. 11/11/19   Larey Dresser, MD  benzonatate (TESSALON) 100 MG capsule Take 1-2 capsules (100-200 mg total) by mouth 3 (three) times daily as needed for cough. 12/07/19   Scot Jun, FNP  carisoprodol (SOMA) 350 MG  tablet TAKE 1 TABLET BY MOUTH THREE TIMES A DAY AS NEEDED FOR MUSCLE SPASMS Patient taking differently: Take 350 mg by mouth 3 (three) times daily as needed for muscle spasms.  09/04/19   [name], MD  carvedilol (COREG) 12.5 MG tablet TAKE 1 TABLET (12.5 MG TOTAL) BY MOUTH 2 (TWO) TIMES DAILY WITH A MEAL. 07/07/18   Larey Dresser, MD  digoxin (LANOXIN) 0.25 MG  tablet TAKE 1/2 TABLET BY MOUTH EVERY DAY 11/25/19   Larey Dresser, MD  doxepin (SINEQUAN) 10 MG capsule TAKE 2 CAPSULES BY MOUTH AT BEDTIME. ANNUAL APPT DUE IN JUNE MUST SEE PROVIDER FOR FUTURE REFILLS Patient taking differently: Take 20 mg by mouth at bedtime.  10/05/19   [name], MD  fluticasone (FLONASE) 50 MCG/ACT nasal spray PLACE 2 SPRAYS INTO BOTH NOSTRILS DAILY AS NEEDED FOR ALLERGIES OR RHINITIS. 10/15/19   [name], MD  Fluticasone-Salmeterol (ADVAIR DISKUS) 250-50 MCG/DOSE AEPB Inhale 1 puff into the lungs 2 (two) times daily. 05/08/17   [name], MD  hydrocortisone 2.5 % cream APPLY TO AFFECTED AREA TWICE A DAY Patient taking differently: Apply 1 application topically 2 (two) times daily.  07/27/19   [name], MD  hydrOXYzine (ATARAX/VISTARIL) 10 MG tablet TAKE 1 TABLET BY MOUTH THREE TIMES A DAY AS NEEDED Patient taking differently: Take 10 mg by mouth 3 (three) times daily as needed for itching.  04/27/19   [name], MD  methotrexate (RHEUMATREX) 2.5 MG tablet TAKE 4 TABLETS BY MOUTH EVERY WEEK Patient taking differently: Take 10 mg by mouth once a week. TAKE 4 TABLETS BY MOUTH EVERY WEEK on Sunday 11/10/18   [name], MD  omeprazole (PRILOSEC) 20 MG capsule Take 1 capsule (20 mg total) by mouth daily with supper. 08/27/16   [name], MD  predniSONE (DELTASONE) 10 MG tablet Take 40 mg daily for 1 day, 30 mg daily for 1 day, 20 mg daily for 1 days,10 mg daily for 1 day, then stop 12/11/19   Jonetta Osgood, MD  rosuvastatin (CRESTOR) 5 MG tablet Take 1 tablet (5 mg total) by mouth  daily. 09/24/19 12/23/19  Larey Dresser, MD  silver sulfADIAZINE (SILVADENE) 1 % cream APPLY TOPICALLY TO AFFECTED AREA EVERY DAY Patient taking differently: Apply 1 application topically daily as needed (irritated skin).  03/11/19   [name], MD  spironolactone (ALDACTONE) 25 MG tablet TAKE 1 TABLET BY MOUTH EVERY DAY Patient taking differently: Take 25 mg by mouth daily.  11/02/19   Larey Dresser, MD  torsemide (DEMADEX) 20 MG tablet TAKE 2 TABLETS BY MOUTH TWICE A DAY Patient taking differently: Take 40 mg by mouth 2 (two) times daily.  07/20/19   Larey Dresser, MD  traMADol (ULTRAM) 50 MG tablet Take 1 tablet (50 mg total) by mouth daily as needed. 10/24/19   [name], MD  traZODone (DESYREL) 50 MG tablet TAKE 1 TABLET (50 MG TOTAL) BY MOUTH AT BEDTIME AS NEEDED FOR SLEEP. 12/17/19   [name], MD  triamcinolone (KENALOG) 0.1 % paste APPLY 1 APPLICATION IN THE MOUTH OR THROAT 2 (TWO) TIMES DAILY AS DIRECTED Patient taking differently: Use as directed 1 application in the mouth or throat 2 (two) times daily as needed (for blisters).  11/16/19   [name], MD  TUMS 500 MG chewable tablet Chew 500-2,000 mg by mouth every 4 (four) hours as needed for indigestion or heartburn.  10/19/16   [provider]  valACYclovir (VALTREX) 1000 MG tablet TAKE 1 TABLET BY MOUTH THREE TIMES A DAY Patient taking differently: Take 1,000 mg by mouth 3 (three) times daily as needed (cold sores).  12/02/19   [name], MD  venlafaxine XR (EFFEXOR-XR) 37.5 MG 24 hr capsule TAKE ONE CAPSULE BY MOUTH EVERY DAY WITH BREAKFAST Patient taking differently: Take 37.5 mg by mouth daily. TAKE ONE  CAPSULE BY MOUTH EVERY DAY WITH BREAKFAST 11/25/19   Lyndal Pulley, DO  zolpidem (AMBIEN) 10 MG tablet TAKE 1 TABLET BY MOUTH EVERYDAY AT BEDTIME Patient taking differently: Take 10 mg by mouth at bedtime. TAKE 1 TABLET BY MOUTH EVERYDAY AT BEDTIME 09/21/19   [name], MD    Allergies    Amiodarone,  Statins, and Tape  Review of Systems   Review of Systems  Constitutional: Negative for fever.  HENT: Negative for sore throat.   Eyes: Negative for visual disturbance.       No visual symptoms or disturbance.   Respiratory: Positive for shortness of breath.   Cardiovascular: Positive for chest pain.  Gastrointestinal: Negative for abdominal pain and vomiting.  Endocrine: Negative for polyuria.  Genitourinary: Negative for dysuria and flank pain.  Musculoskeletal: Negative for back pain and neck pain.  Skin: Negative for rash.  Neurological: Negative for numbness and headaches.       No focal or unilateral numbness/weakness.   Hematological: Does not bruise/bleed easily.  Psychiatric/Behavioral: Negative for confusion.    Physical Exam Updated Vital Signs BP 124/76   Pulse 69   Temp 97.7 F (36.5 C) (Oral)   Resp (!) 23   Ht 1.778 m (5\' 10" )   Wt 103 kg   SpO2 96%   BMI 32.57 kg/m   Physical Exam Vitals and nursing note reviewed.  Constitutional:      Appearance: Normal appearance. He is well-developed.  HENT:     Head: Atraumatic.     Nose: Nose normal.     Mouth/Throat:     Mouth: Mucous membranes are moist.     Pharynx: Oropharynx is clear.  Eyes:     General: No scleral icterus.    Conjunctiva/sclera: Conjunctivae normal.  Neck:     Trachea: No tracheal deviation.  Cardiovascular:     Rate and Rhythm: Normal rate and regular rhythm.     Pulses: Normal pulses.     Heart sounds: Normal heart sounds. No murmur. No friction rub. No gallop.   Pulmonary:     Effort: Pulmonary effort is normal. No accessory muscle usage or respiratory distress.     Comments: Basilar rales.  Abdominal:     General: Bowel sounds are normal. There is no distension.     Palpations: Abdomen is soft.     Tenderness: There is no abdominal tenderness. There is no guarding.  Genitourinary:    Comments: No cva tenderness. Musculoskeletal:     Cervical back: Normal range of motion and  neck supple. No rigidity.     Comments: Mild symmetric bilateral lower leg/ankle edema.   Skin:    General: Skin is warm and dry.     Findings: No rash.  Neurological:     Mental Status: He is alert.     Comments: Alert, speech clear.   Psychiatric:        Mood and Affect: Mood normal.     ED Results / Procedures / Treatments   Labs (all labs ordered are listed, but only abnormal results are displayed) Results for orders placed or performed during the hospital encounter of 12/22/19  CBC  Result Value Ref Range   WBC 11.2 (H) 4.0 - 10.5 K/uL   RBC 3.81 (L) 4.22 - 5.81 MIL/uL   Hemoglobin 13.1 13.0 - 17.0 g/dL   HCT 40.3 39.0 - 52.0 %   MCV 105.8 (H) 80.0 - 100.0 fL   MCH 34.4 (H) 26.0 - 34.0  pg   MCHC 32.5 30.0 - 36.0 g/dL   RDW 13.9 11.5 - 15.5 %   Platelets 190 150 - 400 K/uL   nRBC 0.0 0.0 - 0.2 %  Brain natriuretic peptide  Result Value Ref Range   B Natriuretic Peptide 119.8 (H) 0.0 - 100.0 pg/mL  Comprehensive metabolic panel  Result Value Ref Range   Sodium 139 135 - 145 mmol/L   Potassium 4.0 3.5 - 5.1 mmol/L   Chloride 113 (H) 98 - 111 mmol/L   CO2 19 (L) 22 - 32 mmol/L   Glucose, Bld 101 (H) 70 - 99 mg/dL   BUN 24 (H) 8 - 23 mg/dL   Creatinine, Ser 1.08 0.61 - 1.24 mg/dL   Calcium 9.4 8.9 - 10.3 mg/dL   Total Protein 5.9 (L) 6.5 - 8.1 g/dL   Albumin 3.2 (L) 3.5 - 5.0 g/dL   AST 24 15 - 41 U/L   ALT 34 0 - 44 U/L   Alkaline Phosphatase 44 38 - 126 U/L   Total Bilirubin 0.8 0.3 - 1.2 mg/dL   GFR calc non Af Amer >60 >60 mL/min   GFR calc Af Amer >60 >60 mL/min   Anion gap 7 5 - 15  Troponin I (High Sensitivity)  Result Value Ref Range   Troponin I (High Sensitivity) 10 <18 ng/L   *Note: Due to a large number of results and/or encounters for the requested time period, some results have not been displayed. A complete set of results can be found in Results Review.   DG Chest 2 View  Result Date: 12/22/2019 CLINICAL DATA:  Shortness of breath. EXAM: CHEST  - 2 VIEW COMPARISON:  December 09, 2019. FINDINGS: Stable cardiomegaly. Left-sided pacemaker is unchanged in position. No pneumothorax or significant pleural effusion is noted. Mild bibasilar subsegmental atelectasis is noted. Bony thorax is unremarkable. IMPRESSION: Mild bibasilar subsegmental atelectasis. Electronically Signed   By: Marijo Conception M.D.   On: 12/22/2019 10:52   CT Angio Chest PE W/Cm &/Or Wo Cm  Result Date: 12/09/2019 CLINICAL DATA:  Shortness of breath.  COVID-19 positive EXAM: CT ANGIOGRAPHY CHEST WITH CONTRAST TECHNIQUE: Multidetector CT imaging of the chest was performed using the standard protocol during bolus administration of intravenous contrast. Multiplanar CT image reconstructions and MIPs were obtained to evaluate the vascular anatomy. CONTRAST:  28mL OMNIPAQUE IOHEXOL 350 MG/ML SOLN COMPARISON:  Chest radiograph December 09, 2019 FINDINGS: Cardiovascular: There is no demonstrable pulmonary embolus. There is no thoracic aortic aneurysm. No dissection evident. The contrast bolus in the aorta is less than optimal for dissection assessment. There are foci of aortic atherosclerosis. Visualized great vessels appear normal. There are foci of coronary artery calcification. There is a pacemaker with lead tips attached to right atrium, right ventricle, and coronary sinus. There is no pericardial effusion or pericardial thickening. Mediastinum/Nodes: Thyroid appears normal. No thoracic adenopathy appreciable. No esophageal lesions evident. Lungs/Pleura: There is airspace opacity with relative consolidation in both lower lobes, primarily posteriorly in each lower lobe as well as in a portion of the superior segment right lower lobe. Also small area of infiltrate noted in the inferior aspect of the posterior segment right upper lobe. No pleural effusions evident. Upper Abdomen: Visualized upper abdominal structures appear normal. Musculoskeletal: There is degenerative change in the thoracic  spine. There is diffuse idiopathic skeletal hyperostosis in the lower thoracic region. No blastic or lytic bone lesions. No chest wall lesions. Pacemaker device is positioned anteriorly on the left. Review of  the MIP images confirms the above findings. IMPRESSION: 1. No demonstrable pulmonary embolus. No thoracic aortic aneurysm. No dissection evident with contrast bolus noted to be less than optimal for dissection assessment. There is aortic atherosclerosis as well as foci of coronary artery calcification. 2. Pacemaker present with lead tips attached to right atrium, right ventricle, and coronary sinus. 3. Pneumonia with consolidation in both lower lobes, primarily posteriorly as well as in portions of the inferior, posterior segment right upper lobe and in the anterior aspect of the superior segment of the right lower lobe. 4.  No evident adenopathy. Aortic Atherosclerosis (ICD10-I70.0). Electronically Signed   By: Lowella Grip III M.D.   On: 12/09/2019 15:24   DG Chest Portable 2 Views  Result Date: 12/07/2019 CLINICAL DATA:  Dyspnea. Shortness of breath. EXAM: CHEST  2 VIEW PORTABLE COMPARISON:  01/14/2018 FINDINGS: Left-sided pacemaker in place. Unchanged hyperinflation and bronchial thickening. Unchanged mild cardiomegaly. Stable mediastinal contours. No pleural effusion, pulmonary edema, focal airspace disease or pneumothorax. Mild lingular scarring. No acute osseous abnormalities are seen. IMPRESSION: Stable hyperinflation and bronchial thickening.  No acute findings. Electronically Signed   By: Keith Rake M.D.   On: 12/07/2019 20:28   DG Chest Port 1 View  Result Date: 12/09/2019 CLINICAL DATA:  COVID positive. EXAM: PORTABLE CHEST 1 VIEW COMPARISON:  12/07/2019 FINDINGS: Cardiac enlargement. Left chest wall ICD is noted with leads in the right atrial appendage and right ventricle. No pleural effusion or edema. No airspace densities identified. The visualized osseous structures are  unremarkable. IMPRESSION: 1. No acute findings. 2. Cardiac enlargement. Electronically Signed   By: Kerby Moors M.D.   On: 12/09/2019 11:56   DG Knee 3 Views Right  Result Date: 12/22/2019 CLINICAL DATA:  Chronic right knee EXAM: RIGHT KNEE - 3 VIEW COMPARISON:  06/15/2014 FINDINGS: Status post right total knee arthroplasty. Arthroplasty components are in their expected alignment without periprosthetic lucency or fracture. Transverse fracture of the mid pole of the patella with approximately 1 cm diastasis of the fracture components. Small knee joint effusion. Soft tissues within normal limits. IMPRESSION: 1. Transverse fracture of the mid pole of the patella with approximately 1 cm diastasis of the fracture components. 2. Small knee joint effusion. 3. Tibial and femoral components of the TKA hardware appear intact. These results will be called to the ordering clinician or representative by the Radiologist Assistant, and communication documented in the PACS or zVision Dashboard. Electronically Signed   By: Davina Poke D.O.   On: 12/22/2019 08:19    EKG EKG Interpretation  Date/Time:  Tuesday December 22 2019 10:16:36 EST Ventricular Rate:  76 PR Interval:    QRS Duration: 186 QT Interval:  420 QTC Calculation: 472 R Axis:   117 Text Interpretation: Ventricular-paced rhythm Confirmed by Lajean Saver [phone]) on 12/22/2019 10:54:52 AM   Radiology DG Chest 2 View  Result Date: 12/22/2019 CLINICAL DATA:  Shortness of breath. EXAM: CHEST - 2 VIEW COMPARISON:  December 09, 2019. FINDINGS: Stable cardiomegaly. Left-sided pacemaker is unchanged in position. No pneumothorax or significant pleural effusion is noted. Mild bibasilar subsegmental atelectasis is noted. Bony thorax is unremarkable. IMPRESSION: Mild bibasilar subsegmental atelectasis. Electronically Signed   By: Marijo Conception M.D.   On: 12/22/2019 10:52   DG Knee 3 Views Right  Result Date: 12/22/2019 CLINICAL DATA:  Chronic right knee  EXAM: RIGHT KNEE - 3 VIEW COMPARISON:  06/15/2014 FINDINGS: Status post right total knee arthroplasty. Arthroplasty components are in their expected alignment without  periprosthetic lucency or fracture. Transverse fracture of the mid pole of the patella with approximately 1 cm diastasis of the fracture components. Small knee joint effusion. Soft tissues within normal limits. IMPRESSION: 1. Transverse fracture of the mid pole of the patella with approximately 1 cm diastasis of the fracture components. 2. Small knee joint effusion. 3. Tibial and femoral components of the TKA hardware appear intact. These results will be called to the ordering clinician or representative by the Radiologist Assistant, and communication documented in the PACS or zVision Dashboard. Electronically Signed   By: Davina Poke D.O.   On: 12/22/2019 08:19    Procedures Procedures (including critical care time)  Medications Ordered in ED Medications - No data to display  ED Course  I have reviewed the triage vital signs and the nursing notes.  Pertinent labs & imaging results that were available during my care of the patient were reviewed by me and considered in my medical decision making (see chart for details).    MDM Rules/Calculators/A&P                      ECG. Labs. Cxr.   Reviewed nursing notes and prior charts for additional history.  Recent cta neg for PE, and c/w viral pna then.  From prior ECHO - CM, EF 25%.   CXR reviewed/interpreted by me - no new or worsening pna. ?mild vascular congestion.  Labs reviewed/interpreted by me - initial trop normal.   Additional labs reviewed/inerpreted by me - bnp improved from prior. hco3 sl low. Po fluids/food. Await delta trop, ua, dig level.   Nurse indicates pt not willing to stay for additional evaluation, labs, etc - indicating he has pulled off all lines/stickers, and is getting dressed.   I discussed given sob and chest discomfort, risk heart attack,  permanent heart damage, worsening sob, etc. Also discussed that workup not complete, including pending labs.   Patient refuses to stay in ED for further testing and/or admission - he requests we give him his papers, and that he will f/u as outpt.  [facility] home health team indicates they will facilitate close f/u for pt:   Patient currently electing to leave AMA prior to completion evaluation.      Final Clinical Impression(s) / ED Diagnoses Final diagnoses:  None    Rx / DC Orders ED Discharge Orders    None           Lajean Saver, MD 12/22/19 1318

## 2019-12-23 ENCOUNTER — Other Ambulatory Visit (HOSPITAL_COMMUNITY): Payer: Medicare HMO

## 2019-12-23 ENCOUNTER — Other Ambulatory Visit: Payer: Self-pay | Admitting: Internal Medicine

## 2019-12-23 DIAGNOSIS — L308 Other specified dermatitis: Secondary | ICD-10-CM | POA: Diagnosis not present

## 2019-12-25 ENCOUNTER — Other Ambulatory Visit: Payer: Self-pay | Admitting: Internal Medicine

## 2019-12-27 DIAGNOSIS — Z95810 Presence of automatic (implantable) cardiac defibrillator: Secondary | ICD-10-CM | POA: Insufficient documentation

## 2019-12-28 ENCOUNTER — Encounter: Payer: Medicare HMO | Admitting: Internal Medicine

## 2019-12-28 ENCOUNTER — Ambulatory Visit (INDEPENDENT_AMBULATORY_CARE_PROVIDER_SITE_OTHER): Payer: Medicare HMO | Admitting: *Deleted

## 2019-12-28 DIAGNOSIS — I428 Other cardiomyopathies: Secondary | ICD-10-CM | POA: Diagnosis not present

## 2019-12-28 LAB — CUP PACEART REMOTE DEVICE CHECK
Battery Remaining Longevity: 64 mo
Battery Voltage: 2.98 V
Brady Statistic AP VP Percent: 0 %
Brady Statistic AP VS Percent: 0 %
Brady Statistic AS VP Percent: 0 %
Brady Statistic AS VS Percent: 0 %
Brady Statistic RA Percent Paced: 0 %
Brady Statistic RV Percent Paced: 99.96 %
Date Time Interrogation Session: 20210315043824
HighPow Impedance: 95 Ohm
Implantable Lead Implant Date: 20181205
Implantable Lead Implant Date: 20181205
Implantable Lead Location: 753858
Implantable Lead Location: 753860
Implantable Lead Model: 4398
Implantable Pulse Generator Implant Date: 20181205
Lead Channel Impedance Value: 1083 Ohm
Lead Channel Impedance Value: 1121 Ohm
Lead Channel Impedance Value: 1178 Ohm
Lead Channel Impedance Value: 1197 Ohm
Lead Channel Impedance Value: 261.164
Lead Channel Impedance Value: 306.269
Lead Channel Impedance Value: 306.269
Lead Channel Impedance Value: 312.941
Lead Channel Impedance Value: 312.941
Lead Channel Impedance Value: 342 Ohm
Lead Channel Impedance Value: 4047 Ohm
Lead Channel Impedance Value: 418 Ohm
Lead Channel Impedance Value: 513 Ohm
Lead Channel Impedance Value: 532 Ohm
Lead Channel Impedance Value: 760 Ohm
Lead Channel Impedance Value: 760 Ohm
Lead Channel Impedance Value: 836 Ohm
Lead Channel Impedance Value: 893 Ohm
Lead Channel Pacing Threshold Amplitude: 0.375 V
Lead Channel Pacing Threshold Pulse Width: 0.4 ms
Lead Channel Sensing Intrinsic Amplitude: 18.25 mV
Lead Channel Sensing Intrinsic Amplitude: 18.25 mV
Lead Channel Setting Pacing Amplitude: 2 V
Lead Channel Setting Pacing Amplitude: 2.5 V
Lead Channel Setting Pacing Pulse Width: 0.4 ms
Lead Channel Setting Pacing Pulse Width: 0.6 ms
Lead Channel Setting Sensing Sensitivity: 0.3 mV

## 2019-12-29 ENCOUNTER — Ambulatory Visit (INDEPENDENT_AMBULATORY_CARE_PROVIDER_SITE_OTHER): Payer: Medicare HMO

## 2019-12-29 DIAGNOSIS — Z95810 Presence of automatic (implantable) cardiac defibrillator: Secondary | ICD-10-CM

## 2019-12-29 DIAGNOSIS — I5022 Chronic systolic (congestive) heart failure: Secondary | ICD-10-CM | POA: Diagnosis not present

## 2019-12-29 NOTE — Progress Notes (Signed)
ICD Remote  

## 2020-01-01 NOTE — Progress Notes (Signed)
EPIC Encounter for ICM Monitoring  Patient Name: [name] is a 64 y.o. male Date: 01/01/2020 Primary Care Physican: [name], MD Primary Cardiologist:McLean Electrophysiologist:Klein Bi-V Pacing:100% 3/19/2021Weight: 227lbs   Spoke with patient and he is asymptomatic.  Patient had ER Visit on 3/9 due to SOB.  He was symptomatic with weight gain and SOB during decreased impedance.  Optivol thoracic impedancenormal but was suggesting possible fluid accumulation from 12/07/2019 - 12/25/2019.  Prescribed:Torsemide 20 mg to 2 tablets (40 mg total)twice a day.  Labs: 11/06/2019 Creatinine 1.08, BUN 21, Potassium 4.5, Sodium 139, GFR >60 09/23/2019 Creatinine 0.87, BUN 26, Potassium 3.9, Sodium 141, GFR 88.13 03/23/2019 Creatinine 1.05, BUN 31, Potassium 4.3, Sodium 141, GFR 71.05 11/24/2018 Creatinine 1.32, BUN 18, Potassium 4.5, Sodium 139, GFR 57->60 A complete set of results can be found in Results Review.  Recommendations:No changes and encouraged to call if experiencing any fluid symptoms.  Follow-up plan: ICM clinic phone appointment on4/19/2021. 91 day device clinic remote transmission 03/28/2020.Office appointment 02/02/2020 with Dr [name] and 02/23/2020 with Dr.Klein.  3 month ICM trend: 12/28/2019    1 Year ICM trend:       Zachary Billings, RN 01/01/2020 3:35 PM

## 2020-01-06 ENCOUNTER — Other Ambulatory Visit: Payer: Self-pay | Admitting: Internal Medicine

## 2020-01-06 DIAGNOSIS — L308 Other specified dermatitis: Secondary | ICD-10-CM

## 2020-01-22 ENCOUNTER — Other Ambulatory Visit: Payer: Self-pay

## 2020-01-22 ENCOUNTER — Telehealth: Payer: Self-pay | Admitting: Family Medicine

## 2020-01-22 DIAGNOSIS — G8929 Other chronic pain: Secondary | ICD-10-CM

## 2020-01-22 NOTE — Telephone Encounter (Signed)
Spoke with patient. Order changed and provided patient with phone number to call to schedule.

## 2020-01-22 NOTE — Telephone Encounter (Signed)
Patient called regarding the Bone Scan that was ordered for him. He called them to schedule but they said that the code on the order is wrong?  Are you able to help with this?

## 2020-02-01 ENCOUNTER — Ambulatory Visit (INDEPENDENT_AMBULATORY_CARE_PROVIDER_SITE_OTHER): Payer: Medicare HMO

## 2020-02-01 DIAGNOSIS — Z95810 Presence of automatic (implantable) cardiac defibrillator: Secondary | ICD-10-CM | POA: Diagnosis not present

## 2020-02-01 DIAGNOSIS — I5022 Chronic systolic (congestive) heart failure: Secondary | ICD-10-CM

## 2020-02-02 ENCOUNTER — Encounter (HOSPITAL_COMMUNITY): Payer: Medicare HMO | Admitting: Cardiology

## 2020-02-02 ENCOUNTER — Telehealth: Payer: Self-pay | Admitting: Family Medicine

## 2020-02-02 NOTE — Telephone Encounter (Signed)
Pt is in a lot of pain, mostly down deep in his knee but also hip/feet. He is scheduled for a bone scan tomorrow. He is looking for advice on managing his pain better.

## 2020-02-03 ENCOUNTER — Other Ambulatory Visit: Payer: Self-pay

## 2020-02-03 ENCOUNTER — Encounter (HOSPITAL_COMMUNITY)
Admission: RE | Admit: 2020-02-03 | Discharge: 2020-02-03 | Disposition: A | Discharge status: Home / Self Care | Payer: Medicare HMO | Source: Ambulatory Visit | Attending: Family Medicine | Admitting: Family Medicine

## 2020-02-03 ENCOUNTER — Telehealth: Payer: Self-pay

## 2020-02-03 ENCOUNTER — Other Ambulatory Visit: Payer: Self-pay | Admitting: Internal Medicine

## 2020-02-03 DIAGNOSIS — G8929 Other chronic pain: Secondary | ICD-10-CM | POA: Diagnosis not present

## 2020-02-03 DIAGNOSIS — I4819 Other persistent atrial fibrillation: Secondary | ICD-10-CM

## 2020-02-03 DIAGNOSIS — M25561 Pain in right knee: Secondary | ICD-10-CM | POA: Diagnosis not present

## 2020-02-03 DIAGNOSIS — I255 Ischemic cardiomyopathy: Secondary | ICD-10-CM

## 2020-02-03 DIAGNOSIS — I428 Other cardiomyopathies: Secondary | ICD-10-CM

## 2020-02-03 DIAGNOSIS — Z96651 Presence of right artificial knee joint: Secondary | ICD-10-CM | POA: Insufficient documentation

## 2020-02-03 MED ORDER — TECHNETIUM TC 99M MEDRONATE IV KIT
20.0000 | PACK | Freq: Once | INTRAVENOUS | Status: AC | PRN
  Administered 2020-02-03: 20 via INTRAVENOUS

## 2020-02-03 NOTE — Telephone Encounter (Signed)
Sent patient MyChart message.

## 2020-02-03 NOTE — Progress Notes (Signed)
EPIC Encounter for ICM Monitoring  Patient Name: [name] is a 64 y.o. male Date: 02/03/2020 Primary Care Physican: [name], MD Primary Cardiologist:McLean Electrophysiologist:Klein Bi-V Pacing:99.5% 3/19/2021Weight: 227lbs   Attempted call to patient and unable to reach.  Left detailed message per DPR regarding transmission. Transmission reviewed.   Optivol thoracic impedancenormal.  Prescribed:Torsemide 20 mg to 2 tablets (40 mg total)twice a day.  Labs: 12/22/2019 Creatinine 1.08, BUN 24, Potassium 4.0, Sodium 139, GFR >60 12/11/2019 Creatinine 0.84, BUN 17, Potassium 4.0, Sodium 139, GFR >60 12/10/2019 Creatinine 0.91, BUN 18, Potassium 3.5, Sodium 140, GFR >60 12/09/2019 Creatinine 1.24, BUN 27, Potassium 3.5, Sodium 133, GFR >60 11/16/2019 Creatinine 1.06, BUN 21, Potassium 4.2, Sodium 140, GFR >60 11/06/2019 Creatinine 1.08, BUN 21, Potassium 4.5, Sodium 139, GFR >60 A complete set of results can be found in Results Review.  Recommendations: Left voice mail with ICM number and encouraged to call if experiencing any fluid symptoms.  Follow-up plan: ICM clinic phone appointment on5/24/2021. 91 day device clinic remote transmission 03/28/2020.Office appointment  02/23/2020 with Dr.Klein.  3 month ICM trend: 02/01/2020    1 Year ICM trend:       [name], RN 02/03/2020 12:30 PM

## 2020-02-03 NOTE — Telephone Encounter (Signed)
If taking the effexor can increase to 75mg 

## 2020-02-03 NOTE — Telephone Encounter (Signed)
Done erx 

## 2020-02-03 NOTE — Telephone Encounter (Signed)
Remote ICM transmission received.  Attempted call to patient regarding ICM remote transmission and left detailed message per DPR.  Advised to return call for any fluid symptoms or questions. Next ICM remote transmission scheduled 03/07/2020.

## 2020-02-04 NOTE — Telephone Encounter (Signed)
Patient called back. Given MD response. He expressed understanding and said he would try that.

## 2020-02-05 DIAGNOSIS — E119 Type 2 diabetes mellitus without complications: Secondary | ICD-10-CM | POA: Diagnosis not present

## 2020-02-05 DIAGNOSIS — H524 Presbyopia: Secondary | ICD-10-CM | POA: Diagnosis not present

## 2020-02-05 LAB — HM DIABETES EYE EXAM

## 2020-02-08 DIAGNOSIS — H524 Presbyopia: Secondary | ICD-10-CM | POA: Diagnosis not present

## 2020-02-08 DIAGNOSIS — H52223 Regular astigmatism, bilateral: Secondary | ICD-10-CM | POA: Diagnosis not present

## 2020-02-11 ENCOUNTER — Encounter: Payer: Self-pay | Admitting: Internal Medicine

## 2020-02-19 ENCOUNTER — Other Ambulatory Visit: Payer: Self-pay | Admitting: Internal Medicine

## 2020-02-19 DIAGNOSIS — I428 Other cardiomyopathies: Secondary | ICD-10-CM

## 2020-02-19 DIAGNOSIS — I255 Ischemic cardiomyopathy: Secondary | ICD-10-CM

## 2020-02-19 DIAGNOSIS — I4819 Other persistent atrial fibrillation: Secondary | ICD-10-CM

## 2020-02-22 DIAGNOSIS — I5030 Unspecified diastolic (congestive) heart failure: Secondary | ICD-10-CM | POA: Insufficient documentation

## 2020-02-23 ENCOUNTER — Encounter: Payer: Self-pay | Admitting: Family Medicine

## 2020-02-23 ENCOUNTER — Ambulatory Visit: Payer: Medicare HMO | Admitting: Family Medicine

## 2020-02-23 ENCOUNTER — Other Ambulatory Visit: Payer: Self-pay

## 2020-02-23 ENCOUNTER — Encounter: Payer: Self-pay | Admitting: Internal Medicine

## 2020-02-23 ENCOUNTER — Ambulatory Visit: Payer: Medicare HMO | Admitting: Internal Medicine

## 2020-02-23 VITALS — BP 110/80 | HR 79 | Ht 70.0 in | Wt 238.0 lb

## 2020-02-23 DIAGNOSIS — R55 Syncope and collapse: Secondary | ICD-10-CM

## 2020-02-23 DIAGNOSIS — I428 Other cardiomyopathies: Secondary | ICD-10-CM

## 2020-02-23 DIAGNOSIS — I4819 Other persistent atrial fibrillation: Secondary | ICD-10-CM

## 2020-02-23 DIAGNOSIS — M1712 Unilateral primary osteoarthritis, left knee: Secondary | ICD-10-CM | POA: Diagnosis not present

## 2020-02-23 DIAGNOSIS — M12811 Other specific arthropathies, not elsewhere classified, right shoulder: Secondary | ICD-10-CM | POA: Diagnosis not present

## 2020-02-23 DIAGNOSIS — Z95810 Presence of automatic (implantable) cardiac defibrillator: Secondary | ICD-10-CM

## 2020-02-23 DIAGNOSIS — M12812 Other specific arthropathies, not elsewhere classified, left shoulder: Secondary | ICD-10-CM | POA: Diagnosis not present

## 2020-02-23 DIAGNOSIS — I5030 Unspecified diastolic (congestive) heart failure: Secondary | ICD-10-CM | POA: Diagnosis not present

## 2020-02-23 LAB — CUP PACEART INCLINIC DEVICE CHECK
Battery Remaining Longevity: 59 mo
Battery Voltage: 2.98 V
Brady Statistic AP VP Percent: 0 %
Brady Statistic AP VS Percent: 0 %
Brady Statistic AS VP Percent: 0 %
Brady Statistic AS VS Percent: 0 %
Brady Statistic RA Percent Paced: 0 %
Brady Statistic RV Percent Paced: 99.92 %
Date Time Interrogation Session: 20210511145858
HighPow Impedance: 78 Ohm
Implantable Lead Implant Date: 20181205
Implantable Lead Implant Date: 20181205
Implantable Lead Location: 753858
Implantable Lead Location: 753860
Implantable Lead Model: 4398
Implantable Pulse Generator Implant Date: 20181205
Lead Channel Impedance Value: 1064 Ohm
Lead Channel Impedance Value: 1064 Ohm
Lead Channel Impedance Value: 1083 Ohm
Lead Channel Impedance Value: 1083 Ohm
Lead Channel Impedance Value: 237.5 Ohm
Lead Channel Impedance Value: 283.468
Lead Channel Impedance Value: 283.468
Lead Channel Impedance Value: 286.508
Lead Channel Impedance Value: 286.508
Lead Channel Impedance Value: 342 Ohm
Lead Channel Impedance Value: 399 Ohm
Lead Channel Impedance Value: 4047 Ohm
Lead Channel Impedance Value: 475 Ohm
Lead Channel Impedance Value: 475 Ohm
Lead Channel Impedance Value: 703 Ohm
Lead Channel Impedance Value: 722 Ohm
Lead Channel Impedance Value: 817 Ohm
Lead Channel Impedance Value: 836 Ohm
Lead Channel Pacing Threshold Amplitude: 0.5 V
Lead Channel Pacing Threshold Pulse Width: 0.4 ms
Lead Channel Setting Pacing Amplitude: 2 V
Lead Channel Setting Pacing Amplitude: 2.5 V
Lead Channel Setting Pacing Pulse Width: 0.4 ms
Lead Channel Setting Pacing Pulse Width: 0.6 ms
Lead Channel Setting Sensing Sensitivity: 0.3 mV

## 2020-02-23 MED ORDER — PREDNISONE 50 MG PO TABS: 50.0000 mg | ORAL_TABLET | Freq: Every day | ORAL | 0 refills | Status: DC

## 2020-02-23 NOTE — Progress Notes (Signed)
[place] [facility] Phone: [phone] Subjective:   [name], am serving as a scribe for Dr. Hulan Saas. This visit occurred during the SARS-CoV-2 public health emergency.  Safety protocols were in place, including screening questions prior to the visit, additional usage of staff PPE, and extensive cleaning of exam room while observing appropriate contact time as indicated for disinfecting solutions.   I'm seeing this patient by the request  of:  [name], MD  CC: Multiple complaints  [id number]   12/21/2019 Has had difficulty before.  X-rays ordered today.  We will get a x-ray and likely a bone scan first.  Will need custom bracing on a more regular basis.  Patient would consider the possibility of a revision at this point  Patient given injection today.  Tolerated procedure well.  Can repeat definitive is necessary.  Patient is agreement with plan.  Follow-up again in 10 to 12 weeks chronic problem with exacerbation  Injection given, discussed icing regimen and home exercise, which activities to do which wants to avoid.  Increase activity slowly over the course the next several days.  Chronic problem with an exacerbation.  Social determinants of health include patient multiple other comorbidities and makes it difficult to do any significant formal physical therapy at the moment.  Follow-up again in 8 to 10 weeks  Update 02/23/2020 [name] is a 64 y.o. male coming in with complaint of bilateral knee pain. Left knee injected last visit. Also having bilateral shoulder pain.  Bilateral shoulder pain has had rotator cuff arthropathy bilaterally previously.  Right knee is a replacement done patient did have a fracture noted of the patella.  Using Tramadol once daily. Pain is not being alleviated at this dosage. Would like recommendations as to what to take. Would like prednisone as he feels like he is having  inflammation throughout his body for past month at least.  Patient gets the pain medication from his primary care provider.    Past Medical History:  Diagnosis Date  . AICD (automatic cardioverter/defibrillator) present   . Anemia    supposed to be taking Vit B but doesn't  . ANXIETY    takes Xanax nightly  . Arthritis   . Asthma    Albuterol prn and Advair daily;also takes Prednisone daily  . Atrial fibrillation ([place]) 09/06/2015  . Cardiomyopathy [facility])    a. EF 25% TEE July 2013; b. EF normalized 2015;  c. 03/2015 Echo: EF 40-45%, difrf HK, PASP 38 mmHg, Mild MR, sev LAE/RAE.  [name] Chronic constipation    takes OTC stool softener  . COPD (chronic obstructive pulmonary disease) ([place])    "one dr says COPD; one dr says emphysema" (09/18/2017)  . DEPRESSION    takes Zoloft and Doxepin daily  . Diverticulitis   . DYSKINESIA, ESOPHAGUS   . Emphysema of lung [facility])    "one dr says COPD; one dr says emphysema" (09/18/2017)  . Essential hypertension       . FIBROMYALGIA   . GERD (gastroesophageal reflux disease)       . Glaucoma   . HYPERLIPIDEMIA    a. Intolerant to statins.  . INSOMNIA    takes Ambien nightly  . Myocardial infarction [facility])    a. 2012 Myoview notable for prior infarct;  b. 03/2015 Lexiscan CL: EF 37%, diff HK, small area of inferior infarct from apex to base-->Med Rx.  . Myocardial infarction ([place])   .  O2 dependent    "2.5L q hs & prn" (09/18/2017)  . Paroxysmal atrial fibrillation ([facility])    a. CHA2DS2VASc = 3--> takes Coumadin;  b. 03/15/2015 Successful TEE/DCCV;  c. 03/2015 recurrent afib, Amio d/c'd in setting of hyperthyroidism.  . Peripheral neuropathy   . Pneumonia 12/2016  . Rash and other nonspecific skin eruption 04/12/2009   no cause found saw dermatologists x 2 and allergist  . SLEEP APNEA, OBSTRUCTIVE    a. doesn't use CPAP  . Syncope    a. 03/2015 s/p MDT LINQ.  [name] Type II diabetes mellitus ([place])        Past Surgical History:  Procedure Laterality Date    . ACNE CYST REMOVAL     2 on back   . AV NODE ABLATION N/A 10/25/2017   Procedure: AV NODE ABLATION;  Surgeon: Deboraha Sprang, MD;  Location: [place] CV LAB;  Service: Cardiovascular;  Laterality: N/A;  . BIV ICD INSERTION CRT-D N/A 09/18/2017   Procedure: BIV ICD INSERTION CRT-D;  Surgeon: Deboraha Sprang, MD;  Location: [place] CV LAB;  Service: Cardiovascular;  Laterality: N/A;  . CARDIAC CATHETERIZATION N/A 03/21/2016   Procedure: Right/Left Heart Cath and Coronary Angiography;  Surgeon: Larey Dresser, MD;  Location: [place] CV LAB;  Service: Cardiovascular;  Laterality: N/A;  . CARDIOVERSION  04/18/2012   Procedure: CARDIOVERSION;  Surgeon: Fay Records, MD;  Location: [place];  Service: Cardiovascular;  Laterality: N/A;  . CARDIOVERSION  04/25/2012   Procedure: CARDIOVERSION;  Surgeon: Thayer Headings, MD;  Location: [place];  Service: Cardiovascular;  Laterality: N/A;  . CARDIOVERSION  04/25/2012   Procedure: CARDIOVERSION;  Surgeon: Fay Records, MD;  Location: [place];  Service: Cardiovascular;  Laterality: N/A;  . CARDIOVERSION  05/09/2012   Procedure: CARDIOVERSION;  Surgeon: Sherren Mocha, MD;  Location: [place];  Service: Cardiovascular;  Laterality: N/A;  changed from crenshaw to cooper by trish/leone-endo  . CARDIOVERSION N/A 03/15/2015   Procedure: CARDIOVERSION;  Surgeon: Thayer Headings, MD;  Location: [facility] ENDOSCOPY;  Service: Cardiovascular;  Laterality: N/A;  . COLONOSCOPY    . COLONOSCOPY WITH PROPOFOL N/A 10/21/2014   Procedure: COLONOSCOPY WITH PROPOFOL;  Surgeon: Ladene Artist, MD;  Location: [facility] ENDOSCOPY;  Service: Endoscopy;  Laterality: N/A;  . EP IMPLANTABLE DEVICE N/A 04/06/2015   Procedure: Loop Recorder Insertion;  Surgeon: Evans Lance, MD;  Location: [place] CV LAB;  Service: Cardiovascular;  Laterality: N/A;  . ESOPHAGOGASTRODUODENOSCOPY    . JOINT REPLACEMENT    . LOOP RECORDER REMOVAL N/A 09/18/2017   Procedure: LOOP RECORDER REMOVAL;  Surgeon:  Deboraha Sprang, MD;  Location: [place] CV LAB;  Service: Cardiovascular;  Laterality: N/A;  . RIGHT/LEFT HEART CATH AND CORONARY ANGIOGRAPHY N/A 01/28/2017   Procedure: Right/Left Heart Cath and Coronary Angiography;  Surgeon: Larey Dresser, MD;  Location: [place] CV LAB;  Service: Cardiovascular;  Laterality: N/A;  . TEE WITHOUT CARDIOVERSION  04/25/2012   Procedure: TRANSESOPHAGEAL ECHOCARDIOGRAM (TEE);  Surgeon: Thayer Headings, MD;  Location: [place];  Service: Cardiovascular;  Laterality: N/A;  . TEE WITHOUT CARDIOVERSION N/A 03/15/2015   Procedure: TRANSESOPHAGEAL ECHOCARDIOGRAM (TEE);  Surgeon: Thayer Headings, MD;  Location: [place];  Service: Cardiovascular;  Laterality: N/A;  . TONSILLECTOMY AND ADENOIDECTOMY    . TOTAL KNEE ARTHROPLASTY Right 06/15/2014   Procedure: TOTAL KNEE ARTHROPLASTY;  Surgeon: Renette Butters, MD;  Location: [place];  Service: Orthopedics;  Laterality: Right;  Social History   Socioeconomic History  . Marital status: Divorced    Spouse name: Not on file  . Number of children: 2  . Years of education: Not on file  . Highest education level: Not on file  Occupational History  . Occupation: retired/disabled. prev worked in Therapist, sports.    Employer: DISABLED  Tobacco Use  . Smoking status: Former Smoker    Packs/day: 2.00    Years: 30.00    Pack years: 60.00    Types: Cigarettes    Quit date: 10/16/2007    Years since quitting: 12.3  . Smokeless tobacco: Never Used  Substance and Sexual Activity  . Alcohol use: No  . Drug use: No  . Sexual activity: Not Currently  Other Topics Concern  . Not on file  Social History Narrative   Lives alone.   Social Determinants of Health   Financial Resource Strain:   . Difficulty of Paying Living Expenses:   Food Insecurity:   . Worried About Charity fundraiser in the Last Year:   . Arboriculturist in the Last Year:   Transportation Needs:   . Film/video editor (Medical):   [name]  Lack of Transportation (Non-Medical):   Physical Activity:   . Days of Exercise per Week:   . Minutes of Exercise per Session:   Stress:   . Feeling of Stress :   Social Connections:   . Frequency of Communication with Friends and Family:   . Frequency of Social Gatherings with Friends and Family:   . Attends Religious Services:   . Active Member of Clubs or Organizations:   . Attends Archivist Meetings:   [name] Marital Status:    Allergies  Allergen Reactions  . Amiodarone Other (See Comments)    hyperthyroidism  . Statins Other (See Comments)    myalgia  . Tape Other (See Comments)    Skin Tears Use Paper Tape Only   Family History  Problem Relation Age of Onset  . COPD Mother   . Asthma Mother   . Colon polyps Mother   . Allergies Mother   . Hypothyroidism Mother   . Asthma Maternal Grandmother   . Colon cancer Neg Hx     Current Outpatient Medications (Endocrine & Metabolic):  .  predniSONE (DELTASONE) 50 MG tablet, Take 1 tablet (50 mg total) by mouth daily.  Current Outpatient Medications (Cardiovascular):  .  carvedilol (COREG) 12.5 MG tablet, TAKE 1 TABLET (12.5 MG TOTAL) BY MOUTH 2 (TWO) TIMES DAILY WITH A MEAL. [name]  digoxin (LANOXIN) 0.25 MG tablet, TAKE 1/2 TABLET BY MOUTH EVERY DAY .  spironolactone (ALDACTONE) 25 MG tablet, TAKE 1 TABLET BY MOUTH EVERY DAY .  torsemide (DEMADEX) 20 MG tablet, TAKE 2 TABLETS BY MOUTH TWICE A DAY  Current Outpatient Medications (Respiratory):  .  albuterol (VENTOLIN HFA) 108 (90 Base) MCG/ACT inhaler, INHALE 2 PUFFS 4 TIMES A DAY as needed .  fluticasone (FLONASE) 50 MCG/ACT nasal spray, PLACE 2 SPRAYS INTO BOTH NOSTRILS DAILY AS NEEDED FOR ALLERGIES OR RHINITIS.  Current Outpatient Medications (Analgesics):  .  acetaminophen (TYLENOL) 500 MG tablet, Take 500 mg by mouth every 6 (six) hours as needed. .  traMADol (ULTRAM) 50 MG tablet, Take 1 tablet (50 mg total) by mouth daily as needed.   Current Outpatient  Medications (Other):  [name]  ALPRAZolam (XANAX) 0.5 MG tablet, TAKE 2 TABLETS BY MOUTH AT BEDTIME .  carisoprodol (SOMA) 350 MG tablet, TAKE  1 TABLET BY MOUTH THREE TIMES A DAY AS NEEDED FOR MUSCLE SPASMS .  doxepin (SINEQUAN) 10 MG capsule, TAKE 2 CAPSULES BY MOUTH AT BEDTIME. ANNUAL APPT DUE IN JUNE MUST SEE PROVIDER FOR FUTURE REFILLS .  hydrocortisone 2.5 % cream, APPLY TO AFFECTED AREA TWICE A DAY .  hydrOXYzine (ATARAX/VISTARIL) 10 MG tablet, Take 1 tablet (10 mg total) by mouth 3 (three) times daily as needed for itching. .  lidocaine (LIDODERM) 5 %, APPLY 1 PATCH TO AFFECTED AREA FOR UP TO 12 HOURS . DO NOT USE MORE THAN ONE PATCH IN 24 HOURS. .  methotrexate (RHEUMATREX) 2.5 MG tablet, TAKE 4 TABLETS BY MOUTH EVERY WEEK .  omeprazole (PRILOSEC) 20 MG capsule, Take 1 capsule (20 mg total) by mouth daily with supper. .  silver sulfADIAZINE (SILVADENE) 1 % cream, APPLY TOPICALLY TO AFFECTED AREA EVERY DAY .  traZODone (DESYREL) 50 MG tablet, TAKE 1 TABLET (50 MG TOTAL) BY MOUTH AT BEDTIME AS NEEDED FOR SLEEP. [name]  triamcinolone (KENALOG) 0.1 % paste, APPLY 1 APPLICATION IN THE MOUTH OR THROAT 2 (TWO) TIMES DAILY AS DIRECTED .  TUMS 500 MG chewable tablet, Chew 500-2,000 mg by mouth every 4 (four) hours as needed for indigestion or heartburn.  .  valACYclovir (VALTREX) 1000 MG tablet, TAKE 1 TABLET BY MOUTH THREE TIMES A DAY .  zolpidem (AMBIEN) 10 MG tablet, TAKE 1 TABLET BY MOUTH EVERYDAY AT BEDTIME .  venlafaxine XR (EFFEXOR-XR) 37.5 MG 24 hr capsule, Take 37.5 mg by mouth in the morning and at bedtime.   Reviewed prior external information including notes and imaging from  primary care provider As well as notes that were available from care everywhere and other healthcare systems.  Past medical history, social, surgical and family history all reviewed in electronic medical record.  No pertanent information unless stated regarding to the chief complaint.   Review of Systems:  No headache,  visual changes, nausea, vomiting, diarrhea, constipation, dizziness, abdominal pain, skin rash, fevers, chills, night sweats, weight loss, swollen lymph nodes, body aches, joint swelling, chest pain, shortness of breath, mood changes. POSITIVE muscle aches  Objective  Blood pressure 106/68, pulse 67, height 5\' 10"  (1.778 m), weight 237 lb (107.5 kg), SpO2 97 %.   General: No apparent distress alert and oriented x3 mood and affect normal, dressed appropriately.  HEENT: Pupils equal, extraocular movements intact  Respiratory: Patient's speak in full sentences and does not appear short of breath  Cardiovascular: No lower extremity edema, non tender, no erythema  Neuro: Cranial nerves II through XII are intact, neurovascularly intact in all extremities with 2+ DTRs and 2+ pulses.  Gait severely antalgic  Bilateral shoulder show the patient does have positive crepitus.  Positive impingement noted.  Negative straight leg test Knee: Left valgus deformity noted. Large thigh to calf ratio.  Tender to palpation over medial and PF joint line.  ROM full in flexion and extension and lower leg rotation. instability with valgus force.  painful patellar compression. Patellar glide with moderate crepitus. Patellar and quadriceps tendons unremarkable. Hamstring and quadriceps strength is normal.  Right knee has significant tenderness over the patella but does have replacement otherwise noted  After informed written and verbal consent, patient was seated on exam table. Left knee was prepped with alcohol swab and utilizing anterolateral approach, patient's left knee space was injected with 4:1  marcaine 0.5%: Kenalog 40mg /dL. Patient tolerated the procedure well without immediate complications.  After informed written and verbal consent, patient was seated  on exam table. Right shoulder was prepped with alcohol swab and utilizing posterior approach, patient's right glenohumeral space was injected with 4:1   marcaine 0.5%: Kenalog 40mg /dL. Patient tolerated the procedure well without immediate complications.  After informed written and verbal consent, patient was seated on exam table. Left shoulder was prepped with alcohol swab and utilizing posterior approach, patient's right glenohumeral space was injected with 4:1  marcaine 0.5%: Kenalog 40mg /dL. Patient tolerated the procedure well without immediate complications.    Impression and Recommendations:     This case required medical decision making of moderate complexity. The above documentation has been reviewed and is accurate and complete Lyndal Pulley, DO       Note: This dictation was prepared with Dragon dictation along with smaller phrase technology. Any transcriptional errors that result from this process are unintentional.

## 2020-02-23 NOTE — Progress Notes (Signed)
He was      Patient Care Team: [name], MD as PCP - General Himmelrich, Bryson Ha, RD (Inactive) as Dietitian   HPI  [name] is a 64 y.o. male seen in followup for CRT Medtronic  Implantation 2018     Had had syncope.  ILR had documented pauses greater than 6 seconds.  Not withstanding his right bundle branch block, it was very wide and so discussions with Dr. DM we elected CRT-D implantation.    He also has atrial fibrillation for which he was treated with amiodarone.  Having had recurrent and persistence of atrial fibrillation he underwent AV junction ablation following CRT implantation  He uses oxygen but not CPAP for his sleep apnea  The patient denies chest pain, shortness of breath, nocturnal dyspnea, orthopnea or peripheral edema.  There have been no palpitations, lightheadedness or syncope.    Has been able to keep most of his weight off    DATE TEST EF    /2012 myoview   44 Prior IMI perfsion defect  /2015 myoview    57% Perfusion defect--MI vs bowel  2016 myoview 35-40 NO perfusion defect  6/16 Echo 40-45 Severe BAE  6/17 Cath 30 no CAD   3/18 Echo 20-25%   4/18 LHC/RHC  No CAD  6/19  Echo  20-25%   1/21 Echo  35-40    Antiarrhythmics Date  dofetilide 2013  amidoarone 2016   Date Cr K Dig Hgb  8/18 1.22 4.5  12.2  2/19  1.09 4.1 0.4   2/20 1.32 4.5    3/21 1.08 4.0  13.1       Past Medical History:  Diagnosis Date  . AICD (automatic cardioverter/defibrillator) present   . Anemia    supposed to be taking Vit B but doesn't  . ANXIETY    takes Xanax nightly  . Arthritis   . Asthma    Albuterol prn and Advair daily;also takes Prednisone daily  . Atrial fibrillation ([name]) 09/06/2015  . Cardiomyopathy [facility])    a. EF 25% TEE July 2013; b. EF normalized 2015;  c. 03/2015 Echo: EF 40-45%, difrf HK, PASP 38 mmHg, Mild MR, sev LAE/RAE.  [name] Chronic constipation    takes OTC stool softener  . COPD (chronic obstructive pulmonary disease) ([place])    "one dr says COPD; one dr says emphysema" (09/18/2017)  . DEPRESSION    takes Zoloft and Doxepin daily  . Diverticulitis   . DYSKINESIA, ESOPHAGUS   . Emphysema of lung [facility])    "one dr says COPD; one dr says emphysema" (09/18/2017)  . Essential hypertension       . FIBROMYALGIA   . GERD (gastroesophageal reflux disease)       . Glaucoma   . HYPERLIPIDEMIA    a. Intolerant to statins.  . INSOMNIA    takes Ambien nightly  . Myocardial infarction [facility])    a. 2012 Myoview notable for prior infarct;  b. 03/2015 Lexiscan CL: EF 37%, diff HK, small area of inferior infarct from apex to base-->Med Rx.  . Myocardial infarction ([place])   . O2 dependent    "2.5L q hs & prn" (09/18/2017)  . Paroxysmal atrial fibrillation (H[facility])    a. CHA2DS2VASc = 3--> takes Coumadin;  b. 03/15/2015 Successful TEE/DCCV;  c. 03/2015 recurrent afib, Amio d/c'd in setting of hyperthyroidism.  . Peripheral neuropathy   . Pneumonia 12/2016  . Rash and other nonspecific skin eruption 04/12/2009   no cause found  saw dermatologists x 2 and allergist  . SLEEP APNEA, OBSTRUCTIVE    a. doesn't use CPAP  . Syncope    a. 03/2015 s/p MDT LINQ.  [name] Type II diabetes mellitus ([place])         Past Surgical History:  Procedure Laterality Date  . ACNE CYST REMOVAL     2 on back   . AV NODE ABLATION N/A 10/25/2017   Procedure: AV NODE ABLATION;  Surgeon: Deboraha Sprang, MD;  Location: [place] CV LAB;  Service: Cardiovascular;  Laterality: N/A;  . BIV ICD INSERTION CRT-D N/A 09/18/2017   Procedure: BIV ICD INSERTION CRT-D;  Surgeon: Deboraha Sprang, MD;  Location: [place] CV LAB;  Service: Cardiovascular;  Laterality: N/A;  . CARDIAC CATHETERIZATION N/A 03/21/2016   Procedure: Right/Left Heart Cath and Coronary Angiography;  Surgeon: Larey Dresser, MD;  Location: [place] [facility] LAB;  Service: Cardiovascular;  Laterality: N/A;  . CARDIOVERSION  04/18/2012   Procedure: CARDIOVERSION;  Surgeon: Fay Records, MD;  Location: [place];   Service: Cardiovascular;  Laterality: N/A;  . CARDIOVERSION  04/25/2012   Procedure: CARDIOVERSION;  Surgeon: Thayer Headings, MD;  Location: [place];  Service: Cardiovascular;  Laterality: N/A;  . CARDIOVERSION  04/25/2012   Procedure: CARDIOVERSION;  Surgeon: Fay Records, MD;  Location: [place];  Service: Cardiovascular;  Laterality: N/A;  . CARDIOVERSION  05/09/2012   Procedure: CARDIOVERSION;  Surgeon: Sherren Mocha, MD;  Location: [place];  Service: Cardiovascular;  Laterality: N/A;  changed from crenshaw to cooper by trish/leone-endo  . CARDIOVERSION N/A 03/15/2015   Procedure: CARDIOVERSION;  Surgeon: Thayer Headings, MD;  Location: [facility] ENDOSCOPY;  Service: Cardiovascular;  Laterality: N/A;  . COLONOSCOPY    . COLONOSCOPY WITH PROPOFOL N/A 10/21/2014   Procedure: COLONOSCOPY WITH PROPOFOL;  Surgeon: Ladene Artist, MD;  Location: [facility] ENDOSCOPY;  Service: Endoscopy;  Laterality: N/A;  . EP IMPLANTABLE DEVICE N/A 04/06/2015   Procedure: Loop Recorder Insertion;  Surgeon: Evans Lance, MD;  Location: [place] [facility] LAB;  Service: Cardiovascular;  Laterality: N/A;  . ESOPHAGOGASTRODUODENOSCOPY    . JOINT REPLACEMENT    . LOOP RECORDER REMOVAL N/A 09/18/2017   Procedure: LOOP RECORDER REMOVAL;  Surgeon: Deboraha Sprang, MD;  Location: [place] CV LAB;  Service: Cardiovascular;  Laterality: N/A;  . RIGHT/LEFT HEART CATH AND CORONARY ANGIOGRAPHY N/A 01/28/2017   Procedure: Right/Left Heart Cath and Coronary Angiography;  Surgeon: Larey Dresser, MD;  Location: [place] [facility];  Service: Cardiovascular;  Laterality: N/A;  . TEE WITHOUT CARDIOVERSION  04/25/2012   Procedure: TRANSESOPHAGEAL ECHOCARDIOGRAM (TEE);  Surgeon: Thayer Headings, MD;  Location: [place];  Service: Cardiovascular;  Laterality: N/A;  . TEE WITHOUT CARDIOVERSION N/A 03/15/2015   Procedure: TRANSESOPHAGEAL ECHOCARDIOGRAM (TEE);  Surgeon: Thayer Headings, MD;  Location: [place];  Service: Cardiovascular;   Laterality: N/A;  . TONSILLECTOMY AND ADENOIDECTOMY    . TOTAL KNEE ARTHROPLASTY Right 06/15/2014   Procedure: TOTAL KNEE ARTHROPLASTY;  Surgeon: Renette Butters, MD;  Location: [name];  Service: Orthopedics;  Laterality: Right;    Current Outpatient Medications  Medication Sig Dispense Refill  . acetaminophen (TYLENOL) 500 MG tablet Take 500 mg by mouth every 6 (six) hours as needed.    [name] albuterol (VENTOLIN HFA) 108 (90 Base) MCG/ACT inhaler INHALE 2 PUFFS 4 TIMES A DAY as needed 54 g 3  . ALPRAZolam (XANAX) 0.5 MG tablet TAKE 2 TABLETS BY MOUTH AT BEDTIME  60 tablet 2  . carisoprodol (SOMA) 350 MG tablet TAKE 1 TABLET BY MOUTH THREE TIMES A DAY AS NEEDED FOR MUSCLE SPASMS 90 tablet 5  . carvedilol (COREG) 12.5 MG tablet TAKE 1 TABLET (12.5 MG TOTAL) BY MOUTH 2 (TWO) TIMES DAILY WITH A MEAL. 180 tablet 3  . digoxin (LANOXIN) 0.25 MG tablet TAKE 1/2 TABLET BY MOUTH EVERY DAY 45 tablet 3  . doxepin (SINEQUAN) 10 MG capsule TAKE 2 CAPSULES BY MOUTH AT BEDTIME. ANNUAL APPT DUE IN JUNE MUST SEE PROVIDER FOR FUTURE REFILLS 180 capsule 0  . fluticasone (FLONASE) 50 MCG/ACT nasal spray PLACE 2 SPRAYS INTO BOTH NOSTRILS DAILY AS NEEDED FOR ALLERGIES OR RHINITIS. 48 mL 1  . hydrocortisone 2.5 % cream APPLY TO AFFECTED AREA TWICE A DAY 28.35 g 6  . hydrOXYzine (ATARAX/VISTARIL) 10 MG tablet Take 1 tablet (10 mg total) by mouth 3 (three) times daily as needed for itching. 60 tablet 2  . lidocaine (LIDODERM) 5 % APPLY 1 PATCH TO AFFECTED AREA FOR UP TO 12 HOURS . DO NOT USE MORE THAN ONE PATCH IN 24 HOURS. 90 patch 2  . methotrexate (RHEUMATREX) 2.5 MG tablet TAKE 4 TABLETS BY MOUTH EVERY WEEK 48 tablet 1  . omeprazole (PRILOSEC) 20 MG capsule Take 1 capsule (20 mg total) by mouth daily with supper. 90 capsule 1  . predniSONE (DELTASONE) 50 MG tablet Take 1 tablet (50 mg total) by mouth daily. 5 tablet 0  . silver sulfADIAZINE (SILVADENE) 1 % cream APPLY TOPICALLY TO AFFECTED AREA EVERY DAY 50 g 3  .  spironolactone (ALDACTONE) 25 MG tablet TAKE 1 TABLET BY MOUTH EVERY DAY 90 tablet 1  . torsemide (DEMADEX) 20 MG tablet TAKE 2 TABLETS BY MOUTH TWICE A DAY 360 tablet 1  . traMADol (ULTRAM) 50 MG tablet Take 1 tablet (50 mg total) by mouth daily as needed. 30 tablet 5  . traZODone (DESYREL) 50 MG tablet TAKE 1 TABLET (50 MG TOTAL) BY MOUTH AT BEDTIME AS NEEDED FOR SLEEP. 90 tablet 1  . triamcinolone (KENALOG) 0.1 % paste APPLY 1 APPLICATION IN THE MOUTH OR THROAT 2 (TWO) TIMES DAILY AS DIRECTED 5 g 2  . TUMS 500 MG chewable tablet Chew 500-2,000 mg by mouth every 4 (four) hours as needed for indigestion or heartburn.     . valACYclovir (VALTREX) 1000 MG tablet TAKE 1 TABLET BY MOUTH THREE TIMES A DAY 21 tablet 2  . venlafaxine XR (EFFEXOR-XR) 37.5 MG 24 hr capsule Take 37.5 mg by mouth in the morning and at bedtime.    [name] zolpidem (AMBIEN) 10 MG tablet TAKE 1 TABLET BY MOUTH EVERYDAY AT BEDTIME 90 tablet 1   No current facility-administered medications for this visit.    Allergies  Allergen Reactions  . Amiodarone Other (See Comments)    hyperthyroidism  . Statins Other (See Comments)    myalgia  . Tape Other (See Comments)    Skin Tears Use Paper Tape Only    Review of Systems negative except from HPI and PMH  Physical Exam BP 110/80   Pulse 79   Ht 5\' 10"  (1.778 m)   Wt 238 lb (108 kg)   SpO2 96%   BMI 34.15 kg/m  Well developed and well nourished in no acute distress HENT normal Neck supple with JVP-flat Clear Device pocket well healed; without hematoma or erythema.  There is no tethering  Regular rate and rhythm, no * murmur Abd-soft with active BS No Clubbing cyanosis edema  Skin-warm and dry A & Oriented  Grossly normal sensory and motor function  ECG Afib with complete heart block  Neg QRS 1 Rs V1   Assessment and  Plan  Ischemic heart disease without significant obstructive disease by catheterization 6/17  HFrEF    CRT-D-Medtronic The patient's device was  interrogated.  The information was reviewed. No changes were made in the programming.     Cardiomyopathy-ischemic/nonischemic  Atrial fibrillation  Permanent   Complete heart block status post AV junction ablation  Syncope   Morbidly obese   Euvolemic continue current meds  No syncope  Without symptoms of ischemia  On Anticoagulation;  No bleeding issues    .50% of our >25 min visit in face to face counseling regarding the above      .

## 2020-02-23 NOTE — Assessment & Plan Note (Signed)
Bilateral injections given today.  Tolerated the procedure well, discussed icing regimen and home exercise, which activities to do which wants to avoid.  Patient is to increase activity slowly over the course the next several weeks.  Discussed icing regimen.  Patient wants to avoid any surgical intervention.  Follow-up again in 4 to 8 weeks

## 2020-02-23 NOTE — Patient Instructions (Signed)
Good to see you  Injected both shoulders and left knee.  If not better in 48 hours then start the prednisone I called in.  Talk to Dr. [name] about tramadol I will send my note See me again in 8 weeks

## 2020-02-23 NOTE — Patient Instructions (Signed)
Medication Instructions:  Your physician recommends that you continue on your current medications as directed. Please refer to the Current Medication list given to you today.  Labwork: None ordered.  Testing/Procedures: None ordered.  Follow-Up: Your physician wants you to follow-up in: 12 months with Dr Klein. You will receive a reminder letter in the mail two months in advance. If you don't receive a letter, please call our office to schedule the follow-up appointment.  Remote monitoring is used to monitor your Pacemaker of ICD from home. This monitoring reduces the number of office visits required to check your device to one time per year. It allows us to keep an eye on the functioning of your device to ensure it is working properly.   Any Other Special Instructions Will Be Listed Below (If Applicable).  If you need a refill on your cardiac medications before your next appointment, please call your pharmacy.   

## 2020-02-23 NOTE — Assessment & Plan Note (Signed)
Repeat injection given today.  Tolerated procedure well.  Patient has a chronic problem with exacerbation noted.  We discussed different medications including the gabapentin he was taken previously discussed prednisone which patient has had pain previously and warned of potential side effects.  Patient is on tramadol.  Still having difficulty patient thinks after the Covid infection.  Follow-up again 8 weeks

## 2020-02-24 ENCOUNTER — Telehealth: Payer: Self-pay

## 2020-02-24 NOTE — Telephone Encounter (Signed)
New message   The patient  asking can medication be change to 2-3 times a day -  both knee cap crack,  1.Medication Requested:traMADol (ULTRAM) 50 MG tablet  2. Pharmacy (Name, Street, City):[id number] - [place], [place]  3. On Med List: Yes   4. Last Visit with PCP: 2.12.21   5. Next visit date with PCP: 6.9.21    Agent: Please be advised that RX refills may take up to 3 business days. We ask that you follow-up with your pharmacy.

## 2020-02-25 MED ORDER — TRAMADOL HCL 50 MG PO TABS: 50.0000 mg | ORAL_TABLET | Freq: Every day | ORAL | 5 refills | Status: DC | PRN

## 2020-02-25 NOTE — Telephone Encounter (Signed)
Done erx 

## 2020-02-26 ENCOUNTER — Telehealth: Payer: Self-pay | Admitting: *Deleted

## 2020-02-26 NOTE — Telephone Encounter (Signed)
Patient returned call. Scheduled for DC appointment on 03/01/20 at 2:30pm. Pt denies questions or concerns at this time.

## 2020-02-26 NOTE — Telephone Encounter (Signed)
LMOVM (DPR) requesting call back to DC. Direct number and office hours provided.  Noted after patient left the office on 02/23/20 that LV threshold test was inadvertently omitted during device check, will offer non-urgent DC appointment for threshold check.

## 2020-02-26 NOTE — Telephone Encounter (Signed)
Left message to notify patient.

## 2020-02-29 ENCOUNTER — Other Ambulatory Visit: Payer: Self-pay | Admitting: Internal Medicine

## 2020-03-01 ENCOUNTER — Ambulatory Visit (INDEPENDENT_AMBULATORY_CARE_PROVIDER_SITE_OTHER): Payer: Medicare HMO | Admitting: Emergency Medicine

## 2020-03-01 ENCOUNTER — Other Ambulatory Visit: Payer: Self-pay

## 2020-03-01 DIAGNOSIS — Z95810 Presence of automatic (implantable) cardiac defibrillator: Secondary | ICD-10-CM

## 2020-03-01 DIAGNOSIS — I4819 Other persistent atrial fibrillation: Secondary | ICD-10-CM

## 2020-03-01 DIAGNOSIS — I428 Other cardiomyopathies: Secondary | ICD-10-CM

## 2020-03-01 LAB — CUP PACEART INCLINIC DEVICE CHECK
Battery Remaining Longevity: 58 mo
Battery Voltage: 2.97 V
Brady Statistic AP VP Percent: 0 %
Brady Statistic AP VS Percent: 0 %
Brady Statistic AS VP Percent: 0 %
Brady Statistic AS VS Percent: 0 %
Brady Statistic RA Percent Paced: 0 %
Brady Statistic RV Percent Paced: 99.97 %
Date Time Interrogation Session: 20210518144551
HighPow Impedance: 86 Ohm
Implantable Lead Implant Date: 20181205
Implantable Lead Implant Date: 20181205
Implantable Lead Location: 753858
Implantable Lead Location: 753860
Implantable Lead Model: 4398
Implantable Pulse Generator Implant Date: 20181205
Lead Channel Impedance Value: 1026 Ohm
Lead Channel Impedance Value: 1026 Ohm
Lead Channel Impedance Value: 1140 Ohm
Lead Channel Impedance Value: 1140 Ohm
Lead Channel Impedance Value: 256.5 Ohm
Lead Channel Impedance Value: 306.269
Lead Channel Impedance Value: 306.269
Lead Channel Impedance Value: 306.269
Lead Channel Impedance Value: 306.269
Lead Channel Impedance Value: 399 Ohm
Lead Channel Impedance Value: 4047 Ohm
Lead Channel Impedance Value: 418 Ohm
Lead Channel Impedance Value: 513 Ohm
Lead Channel Impedance Value: 513 Ohm
Lead Channel Impedance Value: 760 Ohm
Lead Channel Impedance Value: 760 Ohm
Lead Channel Impedance Value: 836 Ohm
Lead Channel Impedance Value: 874 Ohm
Lead Channel Pacing Threshold Amplitude: 1.5 V
Lead Channel Pacing Threshold Pulse Width: 0.6 ms
Lead Channel Setting Pacing Amplitude: 2.25 V
Lead Channel Setting Pacing Amplitude: 2.5 V
Lead Channel Setting Pacing Pulse Width: 0.4 ms
Lead Channel Setting Pacing Pulse Width: 0.6 ms
Lead Channel Setting Sensing Sensitivity: 0.3 mV

## 2020-03-01 NOTE — Progress Notes (Signed)
CRT-D device check in office to check LV threshold. RV testing not performed. No ventricular arrhythmia episodes recorded. Patient bi-ventricularly pacing 100% of the time (98.5% effective). Device programmed with appropriate safety margins; LV output increased to 2.25V @ 0.1ms. Heart failure diagnostics reviewed and trends are stable for patient. Followed in [facility]. Patient aware of auditory alert. Estimated longevity 5 years.  Patient enrolled in remote follow up. Patient education completed including shock plan. Carelink on 03/28/20 and ROV with Dr. [name] in 02/2021.

## 2020-03-07 ENCOUNTER — Ambulatory Visit (INDEPENDENT_AMBULATORY_CARE_PROVIDER_SITE_OTHER): Payer: Medicare HMO

## 2020-03-07 DIAGNOSIS — I5022 Chronic systolic (congestive) heart failure: Secondary | ICD-10-CM | POA: Diagnosis not present

## 2020-03-07 DIAGNOSIS — Z95810 Presence of automatic (implantable) cardiac defibrillator: Secondary | ICD-10-CM

## 2020-03-07 NOTE — Progress Notes (Signed)
EPIC Encounter for ICM Monitoring  Patient Name: [name] is a 64 y.o. male Date: 03/07/2020 Primary Care Physican: [name], MD Primary Cardiologist:McLean Electrophysiologist:Klein Bi-V Pacing: 100% 5/24/2021Weight: 231lbs   Spoke with fluid patient.  He reports weight gain of a couple of pounds and legs are a little swollen.    Optivol thoracic impedancesuggesting possible fluid accumulation since 02/22/2020.  Prescribed:Torsemide 20 mg to 2 tablets (40 mg total)twice a day.  Labs: 12/22/2019 Creatinine 1.08, BUN 24, Potassium 4.0, Sodium 139, GFR >60 12/11/2019 Creatinine 0.84, BUN 17, Potassium 4.0, Sodium 139, GFR >60 12/10/2019 Creatinine 0.91, BUN 18, Potassium 3.5, Sodium 140, GFR >60 12/09/2019 Creatinine 1.24, BUN 27, Potassium 3.5, Sodium 133, GFR >60 11/16/2019 Creatinine 1.06, BUN 21, Potassium 4.2, Sodium 140, GFR >60 11/06/2019 Creatinine 1.08, BUN 21, Potassium 4.5, Sodium 139, GFR >60 A complete set of results can be found in Results Review.  Recommendations:  Advised to increase Torsemide to 60 mg bid x 1-2 days.    Follow-up plan: ICM clinic phone appointment on6/24/2021 since fluid levels will be rechecked at 5/28 OV with Dr [name]. 91 day device clinic remote transmission6/14/2021.Office appointment  03/11/2020 with Dr.McLean.  3 month ICM trend: 03/07/2020    1 Year ICM trend:       [name], RN 03/07/2020 1:49 PM

## 2020-03-11 ENCOUNTER — Ambulatory Visit (HOSPITAL_COMMUNITY)
Admission: RE | Admit: 2020-03-11 | Discharge: 2020-03-11 | Disposition: A | Discharge status: Home / Self Care | Payer: Medicare HMO | Source: Ambulatory Visit | Attending: Cardiology | Admitting: Cardiology

## 2020-03-11 ENCOUNTER — Other Ambulatory Visit: Payer: Self-pay

## 2020-03-11 ENCOUNTER — Encounter (HOSPITAL_COMMUNITY): Payer: Self-pay | Admitting: Cardiology

## 2020-03-11 VITALS — BP 130/72 | HR 72 | Ht 70.0 in | Wt 243.0 lb

## 2020-03-11 DIAGNOSIS — K219 Gastro-esophageal reflux disease without esophagitis: Secondary | ICD-10-CM | POA: Insufficient documentation

## 2020-03-11 DIAGNOSIS — E785 Hyperlipidemia, unspecified: Secondary | ICD-10-CM | POA: Insufficient documentation

## 2020-03-11 DIAGNOSIS — I4819 Other persistent atrial fibrillation: Secondary | ICD-10-CM | POA: Diagnosis not present

## 2020-03-11 DIAGNOSIS — Z9981 Dependence on supplemental oxygen: Secondary | ICD-10-CM | POA: Diagnosis not present

## 2020-03-11 DIAGNOSIS — I4821 Permanent atrial fibrillation: Secondary | ICD-10-CM | POA: Diagnosis not present

## 2020-03-11 DIAGNOSIS — I2510 Atherosclerotic heart disease of native coronary artery without angina pectoris: Secondary | ICD-10-CM | POA: Diagnosis not present

## 2020-03-11 DIAGNOSIS — Z79899 Other long term (current) drug therapy: Secondary | ICD-10-CM | POA: Insufficient documentation

## 2020-03-11 DIAGNOSIS — Z87891 Personal history of nicotine dependence: Secondary | ICD-10-CM | POA: Diagnosis not present

## 2020-03-11 DIAGNOSIS — I5022 Chronic systolic (congestive) heart failure: Secondary | ICD-10-CM | POA: Diagnosis not present

## 2020-03-11 DIAGNOSIS — R21 Rash and other nonspecific skin eruption: Secondary | ICD-10-CM | POA: Insufficient documentation

## 2020-03-11 DIAGNOSIS — I442 Atrioventricular block, complete: Secondary | ICD-10-CM | POA: Diagnosis not present

## 2020-03-11 DIAGNOSIS — J449 Chronic obstructive pulmonary disease, unspecified: Secondary | ICD-10-CM | POA: Insufficient documentation

## 2020-03-11 DIAGNOSIS — Z7901 Long term (current) use of anticoagulants: Secondary | ICD-10-CM | POA: Diagnosis not present

## 2020-03-11 DIAGNOSIS — Z95810 Presence of automatic (implantable) cardiac defibrillator: Secondary | ICD-10-CM | POA: Diagnosis not present

## 2020-03-11 DIAGNOSIS — I110 Hypertensive heart disease with heart failure: Secondary | ICD-10-CM | POA: Insufficient documentation

## 2020-03-11 DIAGNOSIS — I428 Other cardiomyopathies: Secondary | ICD-10-CM | POA: Diagnosis not present

## 2020-03-11 DIAGNOSIS — Z8616 Personal history of COVID-19: Secondary | ICD-10-CM | POA: Diagnosis not present

## 2020-03-11 LAB — BASIC METABOLIC PANEL
Anion gap: 8 (ref 5–15)
BUN: 15 mg/dL (ref 8–23)
CO2: 24 mmol/L (ref 22–32)
Calcium: 9 mg/dL (ref 8.9–10.3)
Chloride: 107 mmol/L (ref 98–111)
Creatinine, Ser: 0.8 mg/dL (ref 0.61–1.24)
GFR calc Af Amer: >60 mL/min (ref >60)
GFR calc non Af Amer: >60 mL/min (ref >60)
Glucose, Bld: 101 mg/dL — ABNORMAL HIGH (ref 70–99)
Potassium: 4.8 mmol/L (ref 3.5–5.1)
Sodium: 139 mmol/L (ref 135–145)

## 2020-03-11 LAB — DIGOXIN LEVEL: Digoxin Level: 0.3 ng/mL — ABNORMAL LOW (ref 0.8–2.0)

## 2020-03-11 MED ORDER — SACUBITRIL-VALSARTAN 24-26 MG PO TABS: 1.0000 | ORAL_TABLET | Freq: Two times a day (BID) | ORAL | 5 refills | Status: DC

## 2020-03-11 MED ORDER — TORSEMIDE 20 MG PO TABS: 40.0000 mg | ORAL_TABLET | Freq: Two times a day (BID) | ORAL | 1 refills | Status: DC

## 2020-03-11 MED ORDER — APIXABAN 5 MG PO TABS: 5.0000 mg | ORAL_TABLET | Freq: Two times a day (BID) | ORAL | 5 refills | Status: DC

## 2020-03-11 MED ORDER — ROSUVASTATIN CALCIUM 5 MG PO TABS: 5.0000 mg | ORAL_TABLET | Freq: Every day | ORAL | 5 refills | Status: DC

## 2020-03-11 NOTE — Patient Instructions (Addendum)
RESTART Eliquis 5mg  (1 tab) twice a day  START Entresto 24/26mg  (1 tab) twice a day  RESTART Crestor 5mg  (1 tab) daily  INCREASE Torsemide to 60mg  (3 tabs) in the morning and 40mg  (2 tabs) in the evening for 3 days THEN resume 40mg  (2 tabs) twice a day  Labs today and repeat in 10 days We will only contact you if something comes back abnormal or we need to make some changes. Otherwise no news is good news!  Your physician recommends that you schedule a follow-up appointment in: 3 weeks with the Nurse Practitioner  Please call office at [phone] option 2 if you have any questions or concerns.   At the [facility], you and your health needs are our priority. As part of our continuing mission to provide you with exceptional heart care, we have created designated Provider Care Teams. These Care Teams include your primary Cardiologist (physician) and Advanced Practice Providers (APPs- Physician Assistants and Nurse Practitioners) who all work together to provide you with the care you need, when you need it.   You may see any of the following providers on your designated Care Team at your next follow up: [name] Dr Glori Bickers . Dr Loralie Champagne . Darrick Grinder, NP . Lyda Jester, PA . Audry Riles, PharmD   Please be sure to bring in all your medications bottles to every appointment.

## 2020-03-13 NOTE — Progress Notes (Signed)
ID:  MICHAELPAUL [name], DOB 01-Apr-1956, MRN [id number]  Provider location: [facility] Type of Visit: Established patient  PCP:  [name], MD  Cardiologist:  Dr. [name]   History of Present Illness: [name] is a 64 y.o. male who has history of COPD on home oxygen, permanent atrial fibrillation, and chronic systolic CHF.  Amiodarone and DCCV were tried for atrial fibrillation in the past without success.   Patient has been noted to have a low EF since 2013.  EF improved by to normal by 2015 but dropped to 30-35% by 11/16.  Cath in 6/17 showed some CAD but not enough to cause cardiomyopathy.  He was admitted in 3/18 with PNA, atrial fibrillation/RVR, and acute on chronic systolic CHF.  Echo in 3/18 showed fall in EF to 10-15%.   I took him for right and left heart cath in 4/18, which showed nonobstructive CAD and relatively optimized filling pressures.    Syncope in 11/18. Loop recorder showed 6 second pause and periods of complete heart block. He had Medtronic BiV ICD placed in 12/18.  In 1/19, he had AV nodal ablation to promote BiV pacing.   Echo was done 6/19 with EF 25-30% with moderate RV dilation/mildly decreased systolic function. CPX 7/19 was submaximal, probably mild functional impairment.    Echo in 1/21 showed EF 35-40% with diffuse hypokinesis, moderate RV dilation with mildly decreased systolic function.   He was admitted with COVID-19 PNA in 2/19.  He had AKI/hypotension and Entresto was stopped.   Patient returns for followup of CHF.   Weight is up about 8 lbs.  He is short of breath walking more than about 100 feet.  He is short of breath with inclines/steps.  He has slept in a recliner for years.  No chest pain.  Generalized fatigue. He is off apixaban, he is unsure why.   Medtronic device interrogation: >99% BiV pacing, thoracic impedance trending down.   Labs (4/18): pro-BNP 904, K 3.1, creatinine 1.39 => 1.08 with BUN 66 => 37, hgb 14.1.   Labs (5/18): hemoglobin 13.1 Labs (6/18): K 4, creatinine 1.3, BNP 294 Labs (7/18): K 3.8, creatinine 1.23, LDL 96, HDL 42 Labs (8/18): K 4.5, creatinine 1.22 Labs (10/18): digoxin 0.4 Labs (12/18): LDL 115, HDL 37, TGs 214, K 4.2, creatinine 0.84 Labs (1/19): K 4.4, creatinine 0.75, pro-BNP 433 Labs (2/19): digoxin 0.4 Labs (3/19): LDL 121, hgb 12.8, K 4.5, creatinine 0.98 Labs (6/19): LDL 48, HDL 45, K 5, creatinine 0.94, digoxin 0.3 Labs (9/19): K 3.7, creatinine 1.09 => 1.32, digoxin 0.2 Labs (11/19): LDL 115 Labs (12/20): K 3.9, creatinine 0.87, LDL 94  Labs (3/21): K 4, creatinine 1.08  PMH: 1. Asthma 2. COPD: On home oxygen. Prior smoker.  3. Atrial fibrillation: Permanent.  He failed DCCV and amiodarone in the past . 4. HTN 5. GERD 6. Hyperlipidemia: Myalgias with atorvastatin 7. BPH.  8. Chronic systolic CHF: Nonischemic cardiomyopathy.  - EF 25% by TEE in 7/13.  Normalized on 2015 echo. - Echo (11/16): EF 30-35%.  - LHC/RHC (6/17): 80% ostial stenosis small D1, 30-40% pLAD.  Mean RA 4, PA 26/10, mean PCWP 11, CI 2.33.  - Echo (3/18): EF 10-15%, diffuse hypokinesis, severe LV dilation, moderate RV dilation with severely decreased systolic function.  - LHC/RHC (4/18): 40% proximal LAD.  Mean RA 8, PA 37/10 mean 22, mean PCWP 22, LVEDP 9, CI 2.39. - Medtronic BiV ICD placement in 12/18 with AV nodal  ablation to promote BiV pacing in 1/19.  - Echo (6/19): EF 25-30% with mild LV dilation, moderately dilated RV with mildly decreased systolic function, IVC not dilated, PASP 23 mmHg.  - CPX (7/19): peak VO2 17.6 (78% predicted), VE/VCO2 slope 31, RER 1.02 => submaximal, probably mild functional impairment.  - Echo (1/21): EF 35-40% with diffuse hypokinesis, moderate RV dilation with mildly decreased systolic function. 9. Morbid obesity 10. ILR in place.  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 Complete heart block: s/p Medtronic CRT-D.  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 OSA:  Untreated, unable to tolerate CPAP.  15. COVID-19 PNA 2/21.   Social History   Socioeconomic History  . Marital status: Divorced    Spouse name: Not on file  . Number of children: 2  . Years of education: Not on file  . Highest education level: Not on file  Occupational History  . Occupation: retired/disabled. prev worked in Therapist, sports.    Employer: DISABLED  Tobacco Use  . Smoking status: Former Smoker    Packs/day: 2.00    Years: 30.00    Pack years: 60.00    Types: Cigarettes    Quit date: 10/16/2007    Years since quitting: 12.4  . Smokeless tobacco: Never Used  Substance and Sexual Activity  . Alcohol use: No  . Drug use: No  . Sexual activity: Not Currently  Other Topics Concern  . Not on file  Social History Narrative   Lives alone.   Social Determinants of Health   Financial Resource Strain:   . Difficulty of Paying Living Expenses:   Food Insecurity:   . Worried About Charity fundraiser in the Last Year:   . Arboriculturist in the Last Year:   Transportation Needs:   . Film/video editor (Medical):   [name] Lack of Transportation (Non-Medical):   Physical Activity:   . Days of Exercise per Week:   . Minutes of Exercise per Session:   Stress:   . Feeling of Stress :   Social Connections:   . Frequency of Communication with Friends and Family:   . Frequency of Social Gatherings with Friends and Family:   . Attends Religious Services:   . Active Member of Clubs or Organizations:   . Attends Archivist Meetings:   [name] Marital Status:   Intimate Partner Violence:   . Fear of Current or Ex-Partner:   . Emotionally Abused:   [name] Physically Abused:   . Sexually Abused:    Family History  Problem Relation Age of Onset  . COPD Mother   . Asthma Mother   . Colon polyps Mother   . Allergies Mother   . Hypothyroidism Mother   . Asthma Maternal Grandmother   . Colon cancer Neg Hx    ROS: All systems reviewed and negative except as per HPI.    Current Outpatient Medications  Medication Sig Dispense Refill  . acetaminophen (TYLENOL) 500 MG tablet Take 500 mg by mouth every 6 (six) hours as needed.    [name] albuterol (VENTOLIN HFA) 108 (90 Base) MCG/ACT inhaler INHALE 2 PUFFS 4 TIMES A DAY as needed 54 g 3  . ALPRAZolam (XANAX) 0.5 MG tablet TAKE 2 TABLETS BY MOUTH AT BEDTIME 60 tablet 2  . carisoprodol (SOMA) 350 MG tablet TAKE 1 TABLET BY MOUTH THREE TIMES A DAY AS NEEDED FOR MUSCLE SPASMS 90 tablet 5  . carvedilol (COREG) 12.5 MG tablet TAKE 1 TABLET (12.5 MG TOTAL) BY MOUTH 2 (TWO)  TIMES DAILY WITH A MEAL. 180 tablet 3  . digoxin (LANOXIN) 0.25 MG tablet TAKE 1/2 TABLET BY MOUTH EVERY DAY 45 tablet 3  . doxepin (SINEQUAN) 10 MG capsule TAKE 2 CAPSULES BY MOUTH AT BEDTIME. ANNUAL APPT DUE IN JUNE MUST SEE PROVIDER FOR FUTURE REFILLS 180 capsule 0  . fluticasone (FLONASE) 50 MCG/ACT nasal spray PLACE 2 SPRAYS INTO BOTH NOSTRILS DAILY AS NEEDED FOR ALLERGIES OR RHINITIS. 48 mL 1  . hydrocortisone 2.5 % cream APPLY TO AFFECTED AREA TWICE A DAY 28.35 g 6  . hydrOXYzine (ATARAX/VISTARIL) 10 MG tablet TAKE 1 TABLET (10 MG TOTAL) BY MOUTH 3 (THREE) TIMES DAILY AS NEEDED FOR ITCHING. 60 tablet 2  . lidocaine (LIDODERM) 5 % APPLY 1 PATCH TO AFFECTED AREA FOR UP TO 12 HOURS . DO NOT USE MORE THAN ONE PATCH IN 24 HOURS. 90 patch 2  . methotrexate (RHEUMATREX) 2.5 MG tablet TAKE 4 TABLETS BY MOUTH EVERY WEEK 48 tablet 1  . omeprazole (PRILOSEC) 20 MG capsule Take 1 capsule (20 mg total) by mouth daily with supper. 90 capsule 1  . predniSONE (DELTASONE) 50 MG tablet Take 1 tablet (50 mg total) by mouth daily. 5 tablet 0  . silver sulfADIAZINE (SILVADENE) 1 % cream APPLY TOPICALLY TO AFFECTED AREA EVERY DAY 50 g 3  . spironolactone (ALDACTONE) 25 MG tablet TAKE 1 TABLET BY MOUTH EVERY DAY 90 tablet 1  . torsemide (DEMADEX) 20 MG tablet Take 2 tablets (40 mg total) by mouth 2 (two) times daily. 360 tablet 1  . traMADol (ULTRAM) 50 MG tablet Take 1  tablet (50 mg total) by mouth daily as needed. 30 tablet 5  . traZODone (DESYREL) 50 MG tablet TAKE 1 TABLET (50 MG TOTAL) BY MOUTH AT BEDTIME AS NEEDED FOR SLEEP. 90 tablet 1  . triamcinolone (KENALOG) 0.1 % paste APPLY 1 APPLICATION IN THE MOUTH OR THROAT 2 (TWO) TIMES DAILY AS DIRECTED 5 g 2  . TUMS 500 MG chewable tablet Chew 500-2,000 mg by mouth every 4 (four) hours as needed for indigestion or heartburn.     . valACYclovir (VALTREX) 1000 MG tablet TAKE 1 TABLET BY MOUTH THREE TIMES A DAY 21 tablet 2  . venlafaxine XR (EFFEXOR-XR) 37.5 MG 24 hr capsule Take 37.5 mg by mouth in the morning and at bedtime.    [name] zolpidem (AMBIEN) 10 MG tablet TAKE 1 TABLET BY MOUTH EVERYDAY AT BEDTIME 90 tablet 1  . apixaban (ELIQUIS) 5 MG TABS tablet Take 1 tablet (5 mg total) by mouth 2 (two) times daily. 60 tablet 5  . rosuvastatin (CRESTOR) 5 MG tablet Take 1 tablet (5 mg total) by mouth daily. 30 tablet 5  . sacubitril-valsartan (ENTRESTO) 24-26 MG Take 1 tablet by mouth 2 (two) times daily. 60 tablet 5   No current facility-administered medications for this encounter.   BP 130/72   Pulse 72   Ht 5\' 10"  (1.778 m)   Wt 110.2 kg (243 lb)   SpO2 96%   BMI 34.87 kg/m  General: NAD Neck: JVP 8 cm, no thyromegaly or thyroid nodule.  Lungs: Clear to auscultation bilaterally with normal respiratory effort. CV: Nondisplaced PMI.  Heart regular S1/S2, no S3/S4, no murmur.  1+ ankle edema.  No carotid bruit.  Normal pedal pulses.  Abdomen: Soft, nontender, no hepatosplenomegaly, no distention.  Skin: Intact without lesions or rashes.  Neurologic: Alert and oriented x 3.  Psych: Normal affect. Extremities: No clubbing or cyanosis.  HEENT: Normal.  Assessment/Plan:  1. Atrial fibrillation: Permanent.  Now s/p AV nodal ablation to allow effective BiV pacing.  - He needs to restart anticoagulation, restart apixaban 5 mg bid.  2. COPD: No longer smoking.  He is on home oxygen at night.   3. Chronic  systolic CHF: Nonischemic cardiomyopathy based on 6/17 cath. However, he had a recent significant fall in EF from 30-35% to 10-15% on echo from 3/18.  LHC/RHC in 4/18 showed nonobstructive CAD and relatively optimized filling pressures.  He now has Medtronic CRT-D device with AV nodal ablation to allow BiV pacing.  Echo 6/19 with EF 25-30% with mildly decreased RV systolic function. CPX 7/19 was submaximal but likely only mild functional limitation from CHF.  Echo in 1/21 showed EF 35-40%.  NYHA class II-III symptoms with mild volume overload by exam and decreased thoracic impedance.  Weight up 8 lbs.   - Continue Coreg 12.5 mg bid.   - Continue spironolactone 25 mg daily.        - Start back on Entresto 24/26 bid (last creatinine 1.08), will check BMET today and in 10 days.  - Increase torsemide to 60 qam/40 qpm x days then back to 40 mg bid.  - Continue digoxin 0.125 daily, check level.  4. CAD: Nonobstructive on 4/18 cath.  No exertional chest pain.  - He will be on Eliquis so no ASA.  - He has also been off Crestor, not sure why.  Restart Crestor 5 mg daily, lipids/LFTs in 2 months.   5. Complete heart block: Now has Medtronic CRT-D device and s/p AV nodal ablation.  6. Rash: Spongiotic dermatitis.  Now on methotrexate and prednisone, much improved.  7. OSA: Unable to tolerate CPAP.    Followup in 3 wks with NP/PA.   Signed, Loralie Champagne, MD  03/13/2020  [facility] [address] [facility] [place] [address] [phone] (office) [phone] (fax)

## 2020-03-15 ENCOUNTER — Other Ambulatory Visit: Payer: Self-pay | Admitting: Family Medicine

## 2020-03-16 ENCOUNTER — Other Ambulatory Visit: Payer: Self-pay | Admitting: Internal Medicine

## 2020-03-16 NOTE — Telephone Encounter (Signed)
Check [place] registry last filled 12/22/2019. MD is out of the office this week pls advise on refill.Marland KitchenJohny [name]

## 2020-03-23 ENCOUNTER — Ambulatory Visit (INDEPENDENT_AMBULATORY_CARE_PROVIDER_SITE_OTHER): Payer: Medicare HMO | Admitting: Internal Medicine

## 2020-03-23 ENCOUNTER — Encounter: Payer: Self-pay | Admitting: Internal Medicine

## 2020-03-23 ENCOUNTER — Other Ambulatory Visit: Payer: Self-pay

## 2020-03-23 VITALS — BP 110/76 | HR 74 | Temp 98.5°F | Ht 70.0 in | Wt 243.0 lb

## 2020-03-23 DIAGNOSIS — E119 Type 2 diabetes mellitus without complications: Secondary | ICD-10-CM

## 2020-03-23 DIAGNOSIS — E559 Vitamin D deficiency, unspecified: Secondary | ICD-10-CM

## 2020-03-23 DIAGNOSIS — Z0000 Encounter for general adult medical examination without abnormal findings: Secondary | ICD-10-CM | POA: Diagnosis not present

## 2020-03-23 DIAGNOSIS — E538 Deficiency of other specified B group vitamins: Secondary | ICD-10-CM

## 2020-03-23 LAB — TSH: TSH: 2.4 u[IU]/mL (ref 0.35–4.50)

## 2020-03-23 LAB — CBC WITH DIFFERENTIAL/PLATELET
Basophils Absolute: 0 10*3/uL (ref 0.0–0.1)
Basophils Relative: 0.5 % (ref 0.0–3.0)
Eosinophils Absolute: 0.2 10*3/uL (ref 0.0–0.7)
Eosinophils Relative: 3.3 % (ref 0.0–5.0)
HCT: 40.7 % (ref 39.0–52.0)
Hemoglobin: 13.7 g/dL (ref 13.0–17.0)
Lymphocytes Relative: 15.9 % (ref 12.0–46.0)
Lymphs Abs: 1 10*3/uL (ref 0.7–4.0)
MCHC: 33.7 g/dL (ref 30.0–36.0)
MCV: 103.2 fl — ABNORMAL HIGH (ref 78.0–100.0)
Monocytes Absolute: 0.8 10*3/uL (ref 0.1–1.0)
Monocytes Relative: 13.2 % — ABNORMAL HIGH (ref 3.0–12.0)
Neutro Abs: 4.2 10*3/uL (ref 1.4–7.7)
Neutrophils Relative %: 67.1 % (ref 43.0–77.0)
Platelets: 163 10*3/uL (ref 150.0–400.0)
RBC: 3.95 Mil/uL — ABNORMAL LOW (ref 4.22–5.81)
RDW: 13.2 % (ref 11.5–15.5)
WBC: 6.3 10*3/uL (ref 4.0–10.5)

## 2020-03-23 LAB — LDL CHOLESTEROL, DIRECT: Direct LDL: 109 mg/dL

## 2020-03-23 LAB — HEPATIC FUNCTION PANEL
ALT: 35 U/L (ref 0–53)
AST: 27 U/L (ref 0–37)
Albumin: 4.2 g/dL (ref 3.5–5.2)
Alkaline Phosphatase: 49 U/L (ref 39–117)
Bilirubin, Direct: 0.2 mg/dL (ref 0.0–0.3)
Total Bilirubin: 0.7 mg/dL (ref 0.2–1.2)
Total Protein: 7.1 g/dL (ref 6.0–8.3)

## 2020-03-23 LAB — LIPID PANEL
Cholesterol: 192 mg/dL (ref 0–200)
HDL: 42.5 mg/dL (ref >39.00)
NonHDL: 149.85
Total CHOL/HDL Ratio: 5
Triglycerides: 253 mg/dL — ABNORMAL HIGH (ref 0.0–149.0)
VLDL: 50.6 mg/dL — ABNORMAL HIGH (ref 0.0–40.0)

## 2020-03-23 LAB — HEMOGLOBIN A1C: Hgb A1c MFr Bld: 6.1 % (ref 4.6–6.5)

## 2020-03-23 LAB — MICROALBUMIN / CREATININE URINE RATIO
Creatinine,U: 148.6 mg/dL
Microalb Creat Ratio: 0.5 mg/g (ref 0.0–30.0)
Microalb, Ur: <0.7 mg/dL (ref 0.0–1.9)

## 2020-03-23 LAB — PSA: PSA: 3.71 ng/mL (ref 0.10–4.00)

## 2020-03-23 LAB — VITAMIN B12: Vitamin B-12: >1526 pg/mL — ABNORMAL HIGH (ref 211–911)

## 2020-03-23 LAB — VITAMIN D 25 HYDROXY (VIT D DEFICIENCY, FRACTURES): VITD: 60.58 ng/mL (ref 30.00–100.00)

## 2020-03-23 NOTE — Patient Instructions (Signed)
Please continue all other medications as before, and refills have been done if requested.  Please have the pharmacy call with any other refills you may need.  Please continue your efforts at being more active, low cholesterol diet, and weight control.  You are otherwise up to date with prevention measures today.  Please keep your appointments with your specialists as you may have planned  Please go to the LAB at the blood drawing area for the tests to be done  You will be contacted by phone if any changes need to be made immediately.  Otherwise, you will receive a letter about your results with an explanation, but please check with MyChart first.  Please remember to sign up for MyChart if you have not done so, as this will be important to you in the future with finding out test results, communicating by private email, and scheduling acute appointments online when needed.  Please make an Appointment to return in 6 months, or sooner if needed 

## 2020-03-23 NOTE — Progress Notes (Signed)
Subjective:    Patient ID: [name], male    DOB: 07/06/1956, 64 y.o.   MRN: [id number]  HPI  Here for wellness and f/u;  Overall doing ok;  Pt denies Chest pain, worsening SOB, DOE, wheezing, orthopnea, PND, worsening LE edema, palpitations, dizziness or syncope.  Pt denies neurological change such as new headache, facial or extremity weakness.  Pt denies polydipsia, polyuria, or low sugar symptoms. Pt states overall good compliance with treatment and medications, good tolerability, and has been trying to follow appropriate diet.  Pt denies worsening depressive symptoms, suicidal ideation or panic. No fever, night sweats, wt loss, loss of appetite, or other constitutional symptoms.  Pt states good ability with ADL's, has low fall risk, home safety reviewed and adequate, no other significant changes in hearing or vision, and only occasionally active with exercise.  Plans to call himself for f/u colonoscopy.  No new complaints Past Medical History:  Diagnosis Date  . AICD (automatic cardioverter/defibrillator) present   . Anemia    supposed to be taking Vit B but doesn't  . ANXIETY    takes Xanax nightly  . Arthritis   . Asthma    Albuterol prn and Advair daily;also takes Prednisone daily  . Atrial fibrillation ([place]) 09/06/2015  . Cardiomyopathy [facility])    a. EF 25% TEE July 2013; b. EF normalized 2015;  c. 03/2015 Echo: EF 40-45%, difrf HK, PASP 38 mmHg, Mild MR, sev LAE/RAE.  [name] Chronic constipation    takes OTC stool softener  . COPD (chronic obstructive pulmonary disease) ([place])    "one dr says COPD; one dr says emphysema" (09/18/2017)  . DEPRESSION    takes Zoloft and Doxepin daily  . Diverticulitis   . DYSKINESIA, ESOPHAGUS   . Emphysema of lung [facility])    "one dr says COPD; one dr says emphysema" (09/18/2017)  . Essential hypertension       . FIBROMYALGIA   . GERD (gastroesophageal reflux disease)       . Glaucoma   . HYPERLIPIDEMIA    a. Intolerant to statins.  . INSOMNIA     takes Ambien nightly  . Myocardial infarction [facility])    a. 2012 Myoview notable for prior infarct;  b. 03/2015 Lexiscan CL: EF 37%, diff HK, small area of inferior infarct from apex to base-->Med Rx.  . Myocardial infarction ([place])   . O2 dependent    "2.5L q hs & prn" (09/18/2017)  . Paroxysmal atrial fibrillation ([facility])    a. CHA2DS2VASc = 3--> takes Coumadin;  b. 03/15/2015 Successful TEE/DCCV;  c. 03/2015 recurrent afib, Amio d/c'd in setting of hyperthyroidism.  . Peripheral neuropathy   . Pneumonia 12/2016  . Rash and other nonspecific skin eruption 04/12/2009   no cause found saw dermatologists x 2 and allergist  . SLEEP APNEA, OBSTRUCTIVE    a. doesn't use CPAP  . Syncope    a. 03/2015 s/p MDT LINQ.  [name] Type II diabetes mellitus ([place])        Past Surgical History:  Procedure Laterality Date  . ACNE CYST REMOVAL     2 on back   . AV NODE ABLATION N/A 10/25/2017   Procedure: AV NODE ABLATION;  Surgeon: Deboraha Sprang, MD;  Location: [place] CV LAB;  Service: Cardiovascular;  Laterality: N/A;  . BIV ICD INSERTION CRT-D N/A 09/18/2017   Procedure: BIV ICD INSERTION CRT-D;  Surgeon: Deboraha Sprang, MD;  Location: [place] CV LAB;  Service: Cardiovascular;  Laterality:  N/A;  . CARDIAC CATHETERIZATION N/A 03/21/2016   Procedure: Right/Left Heart Cath and Coronary Angiography;  Surgeon: Larey Dresser, MD;  Location: [place] CV LAB;  Service: Cardiovascular;  Laterality: N/A;  . CARDIOVERSION  04/18/2012   Procedure: CARDIOVERSION;  Surgeon: Fay Records, MD;  Location: [place];  Service: Cardiovascular;  Laterality: N/A;  . CARDIOVERSION  04/25/2012   Procedure: CARDIOVERSION;  Surgeon: Thayer Headings, MD;  Location: [place];  Service: Cardiovascular;  Laterality: N/A;  . CARDIOVERSION  04/25/2012   Procedure: CARDIOVERSION;  Surgeon: Fay Records, MD;  Location: [place];  Service: Cardiovascular;  Laterality: N/A;  . CARDIOVERSION  05/09/2012   Procedure: CARDIOVERSION;  Surgeon:  Sherren Mocha, MD;  Location: [place];  Service: Cardiovascular;  Laterality: N/A;  changed from crenshaw to cooper by trish/leone-endo  . CARDIOVERSION N/A 03/15/2015   Procedure: CARDIOVERSION;  Surgeon: Thayer Headings, MD;  Location: [facility] ENDOSCOPY;  Service: Cardiovascular;  Laterality: N/A;  . COLONOSCOPY    . COLONOSCOPY WITH PROPOFOL N/A 10/21/2014   Procedure: COLONOSCOPY WITH PROPOFOL;  Surgeon: Ladene Artist, MD;  Location: [facility] ENDOSCOPY;  Service: Endoscopy;  Laterality: N/A;  . EP IMPLANTABLE DEVICE N/A 04/06/2015   Procedure: Loop Recorder Insertion;  Surgeon: Evans Lance, MD;  Location: [place] CV LAB;  Service: Cardiovascular;  Laterality: N/A;  . ESOPHAGOGASTRODUODENOSCOPY    . JOINT REPLACEMENT    . LOOP RECORDER REMOVAL N/A 09/18/2017   Procedure: LOOP RECORDER REMOVAL;  Surgeon: Deboraha Sprang, MD;  Location: [place] CV LAB;  Service: Cardiovascular;  Laterality: N/A;  . RIGHT/LEFT HEART CATH AND CORONARY ANGIOGRAPHY N/A 01/28/2017   Procedure: Right/Left Heart Cath and Coronary Angiography;  Surgeon: Larey Dresser, MD;  Location: [place] CV LAB;  Service: Cardiovascular;  Laterality: N/A;  . TEE WITHOUT CARDIOVERSION  04/25/2012   Procedure: TRANSESOPHAGEAL ECHOCARDIOGRAM (TEE);  Surgeon: Thayer Headings, MD;  Location: [place];  Service: Cardiovascular;  Laterality: N/A;  . TEE WITHOUT CARDIOVERSION N/A 03/15/2015   Procedure: TRANSESOPHAGEAL ECHOCARDIOGRAM (TEE);  Surgeon: Thayer Headings, MD;  Location: [place];  Service: Cardiovascular;  Laterality: N/A;  . TONSILLECTOMY AND ADENOIDECTOMY    . TOTAL KNEE ARTHROPLASTY Right 06/15/2014   Procedure: TOTAL KNEE ARTHROPLASTY;  Surgeon: Renette Butters, MD;  Location: [place];  Service: Orthopedics;  Laterality: Right;    reports that he quit smoking about 12 years ago. His smoking use included cigarettes. He has a 60.00 pack-year smoking history. He has never used smokeless tobacco. He reports that he does not  drink alcohol and does not use drugs. family history includes Allergies in his mother; Asthma in his maternal grandmother and mother; COPD in his mother; Colon polyps in his mother; Hypothyroidism in his mother. Allergies  Allergen Reactions  . Amiodarone Other (See Comments)    hyperthyroidism  . Statins Other (See Comments)    myalgia  . Tape Other (See Comments)    Skin Tears Use Paper Tape Only   Current Outpatient Medications on File Prior to Visit  Medication Sig Dispense Refill  . acetaminophen (TYLENOL) 500 MG tablet Take 500 mg by mouth every 6 (six) hours as needed.    [name] albuterol (VENTOLIN HFA) 108 (90 Base) MCG/ACT inhaler INHALE 2 PUFFS 4 TIMES A DAY as needed 54 g 3  . ALPRAZolam (XANAX) 0.5 MG tablet TAKE 2 TABLETS BY MOUTH AT BEDTIME 60 tablet 2  . carisoprodol (SOMA) 350 MG tablet TAKE 1 TABLET BY  MOUTH THREE TIMES A DAY AS NEEDED FOR MUSCLE SPASMS 90 tablet 5  . carvedilol (COREG) 12.5 MG tablet TAKE 1 TABLET (12.5 MG TOTAL) BY MOUTH 2 (TWO) TIMES DAILY WITH A MEAL. 180 tablet 3  . digoxin (LANOXIN) 0.25 MG tablet TAKE 1/2 TABLET BY MOUTH EVERY DAY 45 tablet 3  . doxepin (SINEQUAN) 10 MG capsule TAKE 2 CAPSULES BY MOUTH AT BEDTIME. ANNUAL APPT DUE IN JUNE MUST SEE PROVIDER FOR FUTURE REFILLS 180 capsule 0  . fluticasone (FLONASE) 50 MCG/ACT nasal spray PLACE 2 SPRAYS INTO BOTH NOSTRILS DAILY AS NEEDED FOR ALLERGIES OR RHINITIS. 48 mL 1  . hydrocortisone 2.5 % cream APPLY TO AFFECTED AREA TWICE A DAY 28.35 g 6  . hydrOXYzine (ATARAX/VISTARIL) 10 MG tablet TAKE 1 TABLET (10 MG TOTAL) BY MOUTH 3 (THREE) TIMES DAILY AS NEEDED FOR ITCHING. 60 tablet 2  . lidocaine (LIDODERM) 5 % APPLY 1 PATCH TO AFFECTED AREA FOR UP TO 12 HOURS . DO NOT USE MORE THAN ONE PATCH IN 24 HOURS. 90 patch 2  . methotrexate (RHEUMATREX) 2.5 MG tablet TAKE 4 TABLETS BY MOUTH EVERY WEEK 48 tablet 1  . omeprazole (PRILOSEC) 20 MG capsule Take 1 capsule (20 mg total) by mouth daily with supper. 90 capsule 1   . predniSONE (DELTASONE) 50 MG tablet Take 1 tablet (50 mg total) by mouth daily. 5 tablet 0  . sacubitril-valsartan (ENTRESTO) 24-26 MG Take 1 tablet by mouth 2 (two) times daily. 60 tablet 5  . silver sulfADIAZINE (SILVADENE) 1 % cream APPLY TOPICALLY TO AFFECTED AREA EVERY DAY 50 g 3  . spironolactone (ALDACTONE) 25 MG tablet TAKE 1 TABLET BY MOUTH EVERY DAY 90 tablet 1  . tamsulosin (FLOMAX) 0.4 MG CAPS capsule     . torsemide (DEMADEX) 20 MG tablet Take 2 tablets (40 mg total) by mouth 2 (two) times daily. 360 tablet 1  . traMADol (ULTRAM) 50 MG tablet Take 1 tablet (50 mg total) by mouth daily as needed. 30 tablet 5  . traZODone (DESYREL) 50 MG tablet TAKE 1 TABLET (50 MG TOTAL) BY MOUTH AT BEDTIME AS NEEDED FOR SLEEP. 90 tablet 1  . triamcinolone (KENALOG) 0.1 % paste APPLY 1 APPLICATION IN THE MOUTH OR THROAT 2 (TWO) TIMES DAILY AS DIRECTED 5 g 2  . TUMS 500 MG chewable tablet Chew 500-2,000 mg by mouth every 4 (four) hours as needed for indigestion or heartburn.     . valACYclovir (VALTREX) 1000 MG tablet TAKE 1 TABLET BY MOUTH THREE TIMES A DAY 21 tablet 2  . venlafaxine XR (EFFEXOR-XR) 37.5 MG 24 hr capsule Take 37.5 mg by mouth in the morning and at bedtime.    [name] zolpidem (AMBIEN) 10 MG tablet TAKE 1 TABLET BY MOUTH EVERYDAY AT BEDTIME 90 tablet 1   No current facility-administered medications on file prior to visit.   Review of Systems All otherwise neg per pt    Objective:   Physical Exam BP 110/76 (BP Location: Left Arm, Patient Position: Sitting, Cuff Size: Large)   Pulse 74   Temp 98.5 F (36.9 C) (Oral)   Ht 5\' 10"  (1.778 m)   Wt 243 lb (110.2 kg)   SpO2 94%   BMI 34.87 kg/m  VS noted,  Constitutional: Pt appears in NAD HENT: Head: NCAT.  Right Ear: External ear normal.  Left Ear: External ear normal.  Eyes: . Pupils are equal, round, and reactive to light. Conjunctivae and EOM are normal Nose: without d/c or deformity Neck:  Neck supple. Gross normal  ROM Cardiovascular: Normal rate and regular rhythm.   Pulmonary/Chest: Effort normal and breath sounds without rales or wheezing.  Abd:  Soft, NT, ND, + BS, no organomegaly Neurological: Pt is alert. At baseline orientation, motor grossly intact Skin: Skin is warm. No rashes, other new lesions, no LE edema Psychiatric: Pt behavior is normal without agitation  All otherwise neg per pt Lab Results  Component Value Date   WBC 6.3 03/23/2020   HGB 13.7 03/23/2020   HCT 40.7 03/23/2020   PLT 163.0 03/23/2020   GLUCOSE 101 (H) 03/11/2020   CHOL 192 03/23/2020   TRIG 253.0 (H) 03/23/2020   HDL 42.50 03/23/2020   LDLDIRECT 109.0 03/23/2020   LDLCALC 136 (H) 11/06/2019   ALT 35 03/23/2020   AST 27 03/23/2020   NA 139 03/11/2020   K 4.8 03/11/2020   CL 107 03/11/2020   CREATININE 0.80 03/11/2020   BUN 15 03/11/2020   CO2 24 03/11/2020   TSH 2.40 03/23/2020   PSA 3.71 03/23/2020   INR 1.1 12/09/2019   HGBA1C 6.1 03/23/2020   MICROALBUR <0.7 03/23/2020      Assessment & Plan:

## 2020-03-24 LAB — URINALYSIS, ROUTINE W REFLEX MICROSCOPIC
Bilirubin Urine: NEGATIVE
Hgb urine dipstick: NEGATIVE
Ketones, ur: NEGATIVE
Leukocytes,Ua: NEGATIVE
Nitrite: NEGATIVE
RBC / HPF: NONE SEEN (ref 0–?)
Specific Gravity, Urine: 1.01 (ref 1.000–1.030)
Total Protein, Urine: NEGATIVE
Urine Glucose: NEGATIVE
Urobilinogen, UA: 0.2 (ref 0.0–1.0)
pH: 7 (ref 5.0–8.0)

## 2020-03-25 ENCOUNTER — Ambulatory Visit (INDEPENDENT_AMBULATORY_CARE_PROVIDER_SITE_OTHER): Payer: Medicare HMO

## 2020-03-25 DIAGNOSIS — Z0000 Encounter for general adult medical examination without abnormal findings: Secondary | ICD-10-CM

## 2020-03-25 NOTE — Patient Instructions (Signed)
[name] , Thank you for taking time to come for your Medicare Wellness Visit. I appreciate your ongoing commitment to your health goals. Please review the following plan we discussed and let me know if I can assist you in the future.   Screening recommendations/referrals: Colonoscopy: last done 03/04/2015; due 2021-2022 Recommended yearly ophthalmology/optometry visit for glaucoma screening and checkup Recommended yearly dental visit for hygiene and checkup  Vaccinations: Influenza vaccine: 07/22/2019 Pneumococcal vaccine: completed Tdap vaccine: 02/22/2017 Shingles vaccine: never done   Covid-19: [employer] 03/21/2020; scheduled for 04/11/2020 for 2nd vaccine  Advanced directives: Please bring a copy of your health care power of attorney and living will to the office at your convenience.  Conditions/risks identified: Yes; watch sugar and carbohydrate intake, drink plenty of water and try to be more active.  Next appointment: Please schedule your next medicare wellness visit in 1 year.  Preventive Care 65 Years and Older, Male Preventive care refers to lifestyle choices and visits with your health care provider that can promote health and wellness. What does preventive care include?  A yearly physical exam. This is also called an annual well check.  Dental exams once or twice a year.  Routine eye exams. Ask your health care provider how often you should have your eyes checked.  Personal lifestyle choices, including:  Daily care of your teeth and gums.  Regular physical activity.  Eating a healthy diet.  Avoiding tobacco and drug use.  Limiting alcohol use.  Practicing safe sex.  Taking low doses of aspirin every day.  Taking vitamin and mineral supplements as recommended by your health care provider. What happens during an annual well check? The services and screenings done by your health care provider during your annual well check will depend on your age, overall health,  lifestyle risk factors, and family history of disease. Counseling  Your health care provider may ask you questions about your:  Alcohol use.  Tobacco use.  Drug use.  Emotional well-being.  Home and relationship well-being.  Sexual activity.  Eating habits.  History of falls.  Memory and ability to understand (cognition).  Work and work Statistician. Screening  You may have the following tests or measurements:  Height, weight, and BMI.  Blood pressure.  Lipid and cholesterol levels. These may be checked every 5 years, or more frequently if you are over 74 years old.  Skin check.  Lung cancer screening. You may have this screening every year starting at age 54 if you have a 30-pack-year history of smoking and currently smoke or have quit within the past 15 years.  Fecal occult blood test (FOBT) of the stool. You may have this test every year starting at age 52.  Flexible sigmoidoscopy or colonoscopy. You may have a sigmoidoscopy every 5 years or a colonoscopy every 10 years starting at age 23.  Prostate cancer screening. Recommendations will vary depending on your family history and other risks.  Hepatitis C blood test.  Hepatitis B blood test.  Sexually transmitted disease (STD) testing.  Diabetes screening. This is done by checking your blood sugar (glucose) after you have not eaten for a while (fasting). You may have this done every 1-3 years.  Abdominal aortic aneurysm (AAA) screening. You may need this if you are a current or former smoker.  Osteoporosis. You may be screened starting at age 28 if you are at high risk. Talk with your health care provider about your test results, treatment options, and if necessary, the need for more  tests. Vaccines  Your health care provider may recommend certain vaccines, such as:  Influenza vaccine. This is recommended every year.  Tetanus, diphtheria, and acellular pertussis (Tdap, Td) vaccine. You may need a Td booster  every 10 years.  Zoster vaccine. You may need this after age 18.  Pneumococcal 13-valent conjugate (PCV13) vaccine. One dose is recommended after age 1.  Pneumococcal polysaccharide (PPSV23) vaccine. One dose is recommended after age 14. Talk to your health care provider about which screenings and vaccines you need and how often you need them. This information is not intended to replace advice given to you by your health care provider. Make sure you discuss any questions you have with your health care provider. Document Released: 10/28/2015 Document Revised: 06/20/2016 Document Reviewed: 08/02/2015 Elsevier Interactive Patient Education  2017 [place] Prevention in the Home Falls can cause injuries. They can happen to people of all ages. There are many things you can do to make your home safe and to help prevent falls. What can I do on the outside of my home?  Regularly fix the edges of walkways and driveways and fix any cracks.  Remove anything that might make you trip as you walk through a door, such as a raised step or threshold.  Trim any bushes or trees on the path to your home.  Use bright outdoor lighting.  Clear any walking paths of anything that might make someone trip, such as rocks or tools.  Regularly check to see if handrails are loose or broken. Make sure that both sides of any steps have handrails.  Any raised decks and porches should have guardrails on the edges.  Have any leaves, snow, or ice cleared regularly.  Use sand or salt on walking paths during winter.  Clean up any spills in your garage right away. This includes oil or grease spills. What can I do in the bathroom?  Use night lights.  Install grab bars by the toilet and in the tub and shower. Do not use towel bars as grab bars.  Use non-skid mats or decals in the tub or shower.  If you need to sit down in the shower, use a plastic, non-slip stool.  Keep the floor dry. Clean up any  water that spills on the floor as soon as it happens.  Remove soap buildup in the tub or shower regularly.  Attach bath mats securely with double-sided non-slip rug tape.  Do not have throw rugs and other things on the floor that can make you trip. What can I do in the bedroom?  Use night lights.  Make sure that you have a light by your bed that is easy to reach.  Do not use any sheets or blankets that are too big for your bed. They should not hang down onto the floor.  Have a firm chair that has side arms. You can use this for support while you get dressed.  Do not have throw rugs and other things on the floor that can make you trip. What can I do in the kitchen?  Clean up any spills right away.  Avoid walking on wet floors.  Keep items that you use a lot in easy-to-reach places.  If you need to reach something above you, use a strong step stool that has a grab bar.  Keep electrical cords out of the way.  Do not use floor polish or wax that makes floors slippery. If you must use wax, use non-skid floor  wax.  Do not have throw rugs and other things on the floor that can make you trip. What can I do with my stairs?  Do not leave any items on the stairs.  Make sure that there are handrails on both sides of the stairs and use them. Fix handrails that are broken or loose. Make sure that handrails are as long as the stairways.  Check any carpeting to make sure that it is firmly attached to the stairs. Fix any carpet that is loose or worn.  Avoid having throw rugs at the top or bottom of the stairs. If you do have throw rugs, attach them to the floor with carpet tape.  Make sure that you have a light switch at the top of the stairs and the bottom of the stairs. If you do not have them, ask someone to add them for you. What else can I do to help prevent falls?  Wear shoes that:  Do not have high heels.  Have rubber bottoms.  Are comfortable and fit you well.  Are closed  at the toe. Do not wear sandals.  If you use a stepladder:  Make sure that it is fully opened. Do not climb a closed stepladder.  Make sure that both sides of the stepladder are locked into place.  Ask someone to hold it for you, if possible.  Clearly mark and make sure that you can see:  Any grab bars or handrails.  First and last steps.  Where the edge of each step is.  Use tools that help you move around (mobility aids) if they are needed. These include:  Canes.  Walkers.  Scooters.  Crutches.  Turn on the lights when you go into a dark area. Replace any light bulbs as soon as they burn out.  Set up your furniture so you have a clear path. Avoid moving your furniture around.  If any of your floors are uneven, fix them.  If there are any pets around you, be aware of where they are.  Review your medicines with your doctor. Some medicines can make you feel dizzy. This can increase your chance of falling. Ask your doctor what other things that you can do to help prevent falls. This information is not intended to replace advice given to you by your health care provider. Make sure you discuss any questions you have with your health care provider. Document Released: 07/28/2009 Document Revised: 03/08/2016 Document Reviewed: 11/05/2014 Elsevier Interactive Patient Education  2017 [employer].

## 2020-03-25 NOTE — Progress Notes (Signed)
I connected with [name] today by telephone and verified that I am speaking with the correct person using two identifiers. Location patient: home Location provider: work Persons participating in the virtual visit: Creg Gilmer and [employer]. Ariannie Penaloza, LPN   I discussed the limitations, risks, security and privacy concerns of performing an evaluation and management service by telephone and the availability of in person appointments. I also discussed with the patient that there may be a patient responsible charge related to this service. The patient expressed understanding and verbally consented to this telephonic visit.    Interactive audio and video telecommunications were attempted between this provider and patient, however failed, due to patient having technical difficulties OR patient did not have access to video capability.  We continued and completed visit with audio only.  Some vital signs may be absent or patient reported.   Time Spent with patient on telephone encounter: 20 minutes  Subjective:   [name] is a 64 y.o. male who presents for Medicare Annual/Subsequent preventive examination.  Review of Systems:  No ROS. Medicare Wellness Visit Cardiac Risk Factors include: advanced age (>57men, >36 women);hypertension;male gender;obesity (BMI >30kg/m2);diabetes mellitus;dyslipidemia     Objective:    Vitals: There were no vitals taken for this visit.  There is no height or weight on file to calculate BMI.  Advanced Directives 03/25/2020 12/10/2019 10/25/2017 09/18/2017 09/18/2017 04/29/2017 01/28/2017  Does Patient Have a Medical Advance Directive? Yes Yes No Yes Yes Yes No  Type of Advance Directive Living will Living will - Living will Living will [place];Living will -  Does patient want to make changes to medical advance directive? No - Patient declined No - Patient declined - No - Patient declined No - Patient declined - -  Copy of [place] in Chart? - - - - - No - copy requested -  Would patient like information on creating a medical advance directive? - - No - Patient declined - - - No - Patient declined  Pre-existing out of facility DNR order (yellow form or pink MOST form) - - - - - - -    Tobacco Social History   Tobacco Use  Smoking Status Former Smoker   Packs/day: 2.00   Years: 30.00   Pack years: 60.00   Types: Cigarettes   Quit date: 10/16/2007   Years since quitting: 12.4  Smokeless Tobacco Never Used     Counseling given: Not Answered   Clinical Intake:  Pre-visit preparation completed: Yes  Pain : 0-10 Pain Score: 7  Pain Type: Chronic pain Pain Location: Knee Pain Orientation: Left, Right (Has two broken knee caps due to a fall) Pain Descriptors / Indicators: Constant, Discomfort, Other (Comment) (severe pain) Pain Onset: More than a month ago Pain Frequency: Constant Pain Relieving Factors: Tramadol Effect of Pain on Daily Activities: Yes; not able to exercise or be more mobile  Pain Relieving Factors: Tramadol  Nutritional Risks: None Diabetes: Yes CBG done?: No Did pt. bring in CBG monitor from home?: No  How often do you need to have someone help you when you read instructions, pamphlets, or other written materials from your doctor or pharmacy?: 1 - Never What is the last grade level you completed in school?: High School Graduate  Interpreter Needed?: No  Information entered by :: Sheral Flow, LPN  Past Medical History:  Diagnosis Date   AICD (automatic cardioverter/defibrillator) present    Anemia    supposed to  be taking Vit B but doesn't   ANXIETY    takes Xanax nightly   Arthritis    Asthma    Albuterol prn and Advair daily;also takes Prednisone daily   Atrial fibrillation ([place]) 09/06/2015   Cardiomyopathy ([place])    a. EF 25% TEE July 2013; b. EF normalized 2015;  c. 03/2015 Echo: EF 40-45%, difrf HK, PASP 38 mmHg, Mild MR, sev LAE/RAE.    Chronic constipation    takes OTC stool softener   COPD (chronic obstructive pulmonary disease) (HCC)    "one dr says COPD; one dr says emphysema" (09/18/2017)   DEPRESSION    takes Zoloft and Doxepin daily   Diverticulitis    DYSKINESIA, ESOPHAGUS    Emphysema of lung ([place])    "one dr says COPD; one dr says emphysema" (09/18/2017)   Essential hypertension        FIBROMYALGIA    GERD (gastroesophageal reflux disease)        Glaucoma    HYPERLIPIDEMIA    a. Intolerant to statins.   INSOMNIA    takes Ambien nightly   Myocardial infarction [facility])    a. 2012 Myoview notable for prior infarct;  b. 03/2015 Lexiscan CL: EF 37%, diff HK, small area of inferior infarct from apex to base-->Med Rx.   Myocardial infarction ([place])    O2 dependent    "2.5L q hs & prn" (09/18/2017)   Paroxysmal atrial fibrillation ([place])    a. CHA2DS2VASc = 3--> takes Coumadin;  b. 03/15/2015 Successful TEE/DCCV;  c. 03/2015 recurrent afib, Amio d/c'd in setting of hyperthyroidism.   Peripheral neuropathy    Pneumonia 12/2016   Rash and other nonspecific skin eruption 04/12/2009   no cause found saw dermatologists x 2 and allergist   SLEEP APNEA, OBSTRUCTIVE    a. doesn't use CPAP   Syncope    a. 03/2015 s/p MDT LINQ.   Type II diabetes mellitus ([place])        Past Surgical History:  Procedure Laterality Date   ACNE CYST REMOVAL     2 on back    AV NODE ABLATION N/A 10/25/2017   Procedure: AV NODE ABLATION;  Surgeon: Deboraha Sprang, MD;  Location: [place] CV LAB;  Service: Cardiovascular;  Laterality: N/A;   BIV ICD INSERTION CRT-D N/A 09/18/2017   Procedure: BIV ICD INSERTION CRT-D;  Surgeon: Deboraha Sprang, MD;  Location: [place] CV LAB;  Service: Cardiovascular;  Laterality: N/A;   CARDIAC CATHETERIZATION N/A 03/21/2016   Procedure: Right/Left Heart Cath and Coronary Angiography;  Surgeon: Larey Dresser, MD;  Location: [place] [facility] LAB;  Service: Cardiovascular;  Laterality:  N/A;   CARDIOVERSION  04/18/2012   Procedure: CARDIOVERSION;  Surgeon: Fay Records, MD;  Location: [place];  Service: Cardiovascular;  Laterality: N/A;   CARDIOVERSION  04/25/2012   Procedure: CARDIOVERSION;  Surgeon: Thayer Headings, MD;  Location[facility] ENDOSCOPY;  Service: Cardiovascular;  Laterality: N/A;   CARDIOVERSION  04/25/2012   Procedure: CARDIOVERSION;  Surgeon: Fay Records, MD;  Location: [place];  Service: Cardiovascular;  Laterality: N/A;   CARDIOVERSION  05/09/2012   Procedure: CARDIOVERSION;  Surgeon: Sherren Mocha, MD;  Location: [place];  Service: Cardiovascular;  Laterality: N/A;  changed from crenshaw to cooper by trish/leone-endo   CARDIOVERSION N/A 03/15/2015   Procedure: CARDIOVERSION;  Surgeon: Thayer Headings, MD;  Location: [place];  Service: Cardiovascular;  Laterality: N/A;   COLONOSCOPY     COLONOSCOPY WITH PROPOFOL N/A 10/21/2014  Procedure: COLONOSCOPY WITH PROPOFOL;  Surgeon: Ladene Artist, MD;  Location: [facility] ENDOSCOPY;  Service: Endoscopy;  Laterality: N/A;   EP IMPLANTABLE DEVICE N/A 04/06/2015   Procedure: Loop Recorder Insertion;  Surgeon: Evans Lance, MD;  Location: [place] CV LAB;  Service: Cardiovascular;  Laterality: N/A;   ESOPHAGOGASTRODUODENOSCOPY     JOINT REPLACEMENT     LOOP RECORDER REMOVAL N/A 09/18/2017   Procedure: LOOP RECORDER REMOVAL;  Surgeon: Deboraha Sprang, MD;  Location: [place] CV LAB;  Service: Cardiovascular;  Laterality: N/A;   RIGHT/LEFT HEART CATH AND CORONARY ANGIOGRAPHY N/A 01/28/2017   Procedure: Right/Left Heart Cath and Coronary Angiography;  Surgeon: Larey Dresser, MD;  Location: [place] CV LAB;  Service: Cardiovascular;  Laterality: N/A;   TEE WITHOUT CARDIOVERSION  04/25/2012   Procedure: TRANSESOPHAGEAL ECHOCARDIOGRAM (TEE);  Surgeon: Thayer Headings, MD;  Location: [place];  Service: Cardiovascular;  Laterality: N/A;   TEE WITHOUT CARDIOVERSION N/A 03/15/2015   Procedure: TRANSESOPHAGEAL  ECHOCARDIOGRAM (TEE);  Surgeon: Thayer Headings, MD;  Location: [place];  Service: Cardiovascular;  Laterality: N/A;   TONSILLECTOMY AND ADENOIDECTOMY     TOTAL KNEE ARTHROPLASTY Right 06/15/2014   Procedure: TOTAL KNEE ARTHROPLASTY;  Surgeon: Renette Butters, MD;  Location: [place];  Service: Orthopedics;  Laterality: Right;   Family History  Problem Relation Age of Onset   COPD Mother    Asthma Mother    Colon polyps Mother    Allergies Mother    Hypothyroidism Mother    Asthma Maternal Grandmother    Colon cancer Neg Hx    Social History   Socioeconomic History   Marital status: Divorced    Spouse name: Not on file   Number of children: 2   Years of education: Not on file   Highest education level: Not on file  Occupational History   Occupation: retired/disabled. prev worked in Therapist, sports.    Employer: DISABLED  Tobacco Use   Smoking status: Former Smoker    Packs/day: 2.00    Years: 30.00    Pack years: 60.00    Types: Cigarettes    Quit date: 10/16/2007    Years since quitting: 12.4   Smokeless tobacco: Never Used  Vaping Use   Vaping Use: Never used  Substance and Sexual Activity   Alcohol use: No   Drug use: No   Sexual activity: Not Currently  Other Topics Concern   Not on file  Social History Narrative   Lives alone.   Social Determinants of Health   Financial Resource Strain:    Difficulty of Paying Living Expenses:   Food Insecurity:    Worried About Charity fundraiser in the Last Year:    Arboriculturist in the Last Year:   Transportation Needs:    Film/video editor (Medical):    Lack of Transportation (Non-Medical):   Physical Activity:    Days of Exercise per Week:    Minutes of Exercise per Session:   Stress:    Feeling of Stress :   Social Connections:    Frequency of Communication with Friends and Family:    Frequency of Social Gatherings with Friends and Family:    Attends Religious  Services:    Active Member of Clubs or Organizations:    Attends Archivist Meetings:    Marital Status:     Outpatient Encounter Medications as of 03/25/2020  Medication Sig   acetaminophen (TYLENOL) 500 MG tablet Take  500 mg by mouth every 6 (six) hours as needed.   albuterol (VENTOLIN HFA) 108 (90 Base) MCG/ACT inhaler INHALE 2 PUFFS 4 TIMES A DAY as needed   ALPRAZolam (XANAX) 0.5 MG tablet TAKE 2 TABLETS BY MOUTH AT BEDTIME   carisoprodol (SOMA) 350 MG tablet TAKE 1 TABLET BY MOUTH THREE TIMES A DAY AS NEEDED FOR MUSCLE SPASMS   carvedilol (COREG) 12.5 MG tablet TAKE 1 TABLET (12.5 MG TOTAL) BY MOUTH 2 (TWO) TIMES DAILY WITH A MEAL.   digoxin (LANOXIN) 0.25 MG tablet TAKE 1/2 TABLET BY MOUTH EVERY DAY   doxepin (SINEQUAN) 10 MG capsule TAKE 2 CAPSULES BY MOUTH AT BEDTIME. ANNUAL APPT DUE IN JUNE MUST SEE PROVIDER FOR FUTURE REFILLS   fluticasone (FLONASE) 50 MCG/ACT nasal spray PLACE 2 SPRAYS INTO BOTH NOSTRILS DAILY AS NEEDED FOR ALLERGIES OR RHINITIS.   hydrocortisone 2.5 % cream APPLY TO AFFECTED AREA TWICE A DAY   hydrOXYzine (ATARAX/VISTARIL) 10 MG tablet TAKE 1 TABLET (10 MG TOTAL) BY MOUTH 3 (THREE) TIMES DAILY AS NEEDED FOR ITCHING.   lidocaine (LIDODERM) 5 % APPLY 1 PATCH TO AFFECTED AREA FOR UP TO 12 HOURS . DO NOT USE MORE THAN ONE PATCH IN 24 HOURS.   methotrexate (RHEUMATREX) 2.5 MG tablet TAKE 4 TABLETS BY MOUTH EVERY WEEK   omeprazole (PRILOSEC) 20 MG capsule Take 1 capsule (20 mg total) by mouth daily with supper.   predniSONE (DELTASONE) 50 MG tablet Take 1 tablet (50 mg total) by mouth daily.   sacubitril-valsartan (ENTRESTO) 24-26 MG Take 1 tablet by mouth 2 (two) times daily.   silver sulfADIAZINE (SILVADENE) 1 % cream APPLY TOPICALLY TO AFFECTED AREA EVERY DAY   spironolactone (ALDACTONE) 25 MG tablet TAKE 1 TABLET BY MOUTH EVERY DAY   tamsulosin (FLOMAX) 0.4 MG CAPS capsule    torsemide (DEMADEX) 20 MG tablet Take 2 tablets (40 mg  total) by mouth 2 (two) times daily.   traMADol (ULTRAM) 50 MG tablet Take 1 tablet (50 mg total) by mouth daily as needed.   traZODone (DESYREL) 50 MG tablet TAKE 1 TABLET (50 MG TOTAL) BY MOUTH AT BEDTIME AS NEEDED FOR SLEEP.   triamcinolone (KENALOG) 0.1 % paste APPLY 1 APPLICATION IN THE MOUTH OR THROAT 2 (TWO) TIMES DAILY AS DIRECTED   TUMS 500 MG chewable tablet Chew 500-2,000 mg by mouth every 4 (four) hours as needed for indigestion or heartburn.    valACYclovir (VALTREX) 1000 MG tablet TAKE 1 TABLET BY MOUTH THREE TIMES A DAY   venlafaxine XR (EFFEXOR-XR) 37.5 MG 24 hr capsule Take 37.5 mg by mouth in the morning and at bedtime.   zolpidem (AMBIEN) 10 MG tablet TAKE 1 TABLET BY MOUTH EVERYDAY AT BEDTIME   No facility-administered encounter medications on file as of 03/25/2020.    Activities of Daily Living In your present state of health, do you have any difficulty performing the following activities: 03/25/2020 12/10/2019  Hearing? N N  Vision? N N  Difficulty concentrating or making decisions? N N  Walking or climbing stairs? N N  Comment 1 level home -  Dressing or bathing? N N  Doing errands, shopping? N N  Preparing Food and eating ? N -  Using the Toilet? N -  In the past six months, have you accidently leaked urine? N -  Do you have problems with loss of bowel control? N -  Managing your Medications? N -  Managing your Finances? N -  Housekeeping or managing your Housekeeping? N -  Some recent data might be hidden    Patient Care Team: [name], MD as PCP - General Himmelrich, Bryson Ha, RD (Inactive) as Dietitian   Assessment:   This is a routine wellness examination for [place].  Exercise Activities and Dietary recommendations Current Exercise Habits: The patient does not participate in regular exercise at present, Exercise limited by: orthopedic condition(s)  Goals     I want my breathing and heart to get better     Continue to exercise, eat a heart  healthy, follow doctor's instructions, enjoy life, family and serve God.       Fall Risk Fall Risk  03/25/2020 03/23/2020 03/23/2020 03/23/2019 03/27/2018  Falls in the past year? 0 0 0 0 No  Comment - - - - -  Number falls in past yr: 0 - 0 - -  Injury with Fall? 0 - 0 - -  Risk Factor Category  - - - - -  Risk for fall due to : History of fall(s);Orthopedic patient - Impaired balance/gait;History of fall(s) - -  Follow up Falls evaluation completed - Falls evaluation completed - -  Comment - - - - -   Is the patient's home free of loose throw rugs in walkways, pet beds, electrical cords, etc?   yes      Grab bars in the bathroom? no      Handrails on the stairs?   yes      Adequate lighting?   yes  Timed Get Up and Go Performed: not indicated  Depression Screen PHQ 2/9 Scores 03/25/2020 03/23/2020 03/23/2020 03/23/2019  PHQ - 2 Score 0 0 0 0  PHQ- 9 Score - - - -  Exception Documentation - - - -    Cognitive Function: Not indicated; Patient is cogitatively intact.        Immunization History  Administered Date(s) Administered   Influenza Split 07/09/2011, 07/23/2012   Influenza Whole 09/16/2006, 08/26/2007, 07/27/2008, 07/26/2009, 07/07/2010   Influenza,inj,Quad PF,6+ Mos 06/30/2013, 08/19/2014, 06/27/2015, 07/13/2016, 07/09/2017, 07/18/2018   PFIZER SARS-COV-2 Vaccination 03/21/2020   Pneumococcal Conjugate-13 04/29/2017   Pneumococcal Polysaccharide-23 03/29/2010, 07/07/2010, 02/23/2015   Td 10/16/2003   Tdap 02/23/2015    Qualifies for Shingles Vaccine?  Yes  Screening Tests Health Maintenance  Topic Date Due   COLONOSCOPY  03/23/2021 (Originally 03/03/2020)   PNA vac Low Risk Adult (2 of 2 - PPSV23) 03/23/2021 (Originally 02/23/2020)   COVID-19 Vaccine (2 - Pfizer 2-dose series) 04/11/2020   INFLUENZA VACCINE  05/15/2020   HEMOGLOBIN A1C  09/22/2020   OPHTHALMOLOGY EXAM  02/04/2021   FOOT EXAM  03/23/2021   URINE MICROALBUMIN  03/23/2021    TETANUS/TDAP  02/22/2025   Hepatitis C Screening  Completed   HIV Screening  Completed   Cancer Screenings: Lung: Low Dose CT Chest recommended if Age 57-80 years, 30 pack-year currently smoking OR have quit w/in 15years. Patient does qualify. Colorectal: Yes, due 2021-2022 with [facility].  Additional Screenings: Hepatitis C Screening: completed      Plan:    Reviewed health maintenance screenings with patient today and relevant education, vaccines, and/or referrals were provided.    Continue doing brain stimulating activities (puzzles, reading, adult coloring books, staying active) to keep memory sharp.    Continue to eat heart healthy diet (full of fruits, vegetables, whole grains, lean protein, water--limit salt, fat, and sugar intake) and increase physical activity as tolerated.   I have personally reviewed and noted the following in the patients chart:  Medical and social history  Use of alcohol, tobacco or illicit drugs   Current medications and supplements  Functional ability and status  Nutritional status  Physical activity  Advanced directives  List of other physicians  Hospitalizations, surgeries, and ER visits in previous 12 months  Vitals  Screenings to include cognitive, depression, and falls  Referrals and appointments  In addition, I have reviewed and discussed with patient certain preventive protocols, quality metrics, and best practice recommendations. A written personalized care plan for preventive services as well as general preventive health recommendations were provided to patient.     Sheral Flow, LPN  2/84/1324  Nurse Health Advisor  Nurse Notes: There were no vitals filed for this visit. There is no height or weight on file to calculate BMI.

## 2020-03-27 ENCOUNTER — Encounter: Payer: Self-pay | Admitting: Internal Medicine

## 2020-03-27 NOTE — Assessment & Plan Note (Signed)
stable overall by history and exam, recent data reviewed with pt, and pt to continue medical treatment as before,  to f/u any worsening symptoms or concerns  

## 2020-03-27 NOTE — Assessment & Plan Note (Signed)
Overall doing well, age appropriate education and counseling updated, referrals for preventative services and immunizations addressed, dietary and smoking counseling addressed, most recent labs reviewed.  I have personally reviewed and have noted:  1) the patient's medical and social history 2) The pt's use of alcohol, tobacco, and illicit drugs 3) The patient's current medications and supplements 4) Functional ability including ADL's, fall risk, home safety risk, hearing and visual impairment 5) Diet and physical activities 6) Evidence for depression or mood disorder 7) The patient's height, weight, and BMI have been recorded in the chart  I have made referrals, and provided counseling and education based on review of the above  

## 2020-03-28 ENCOUNTER — Ambulatory Visit (INDEPENDENT_AMBULATORY_CARE_PROVIDER_SITE_OTHER): Payer: Medicare HMO | Admitting: *Deleted

## 2020-03-28 DIAGNOSIS — I428 Other cardiomyopathies: Secondary | ICD-10-CM | POA: Diagnosis not present

## 2020-03-28 DIAGNOSIS — I5030 Unspecified diastolic (congestive) heart failure: Secondary | ICD-10-CM

## 2020-03-28 LAB — CUP PACEART REMOTE DEVICE CHECK
Battery Remaining Longevity: 57 mo
Battery Voltage: 2.98 V
Brady Statistic AP VP Percent: 0 %
Brady Statistic AP VS Percent: 0 %
Brady Statistic AS VP Percent: 0 %
Brady Statistic AS VS Percent: 0 %
Brady Statistic RA Percent Paced: 0 %
Brady Statistic RV Percent Paced: 99.98 %
Date Time Interrogation Session: 20210614023324
HighPow Impedance: 85 Ohm
Implantable Lead Implant Date: 20181205
Implantable Lead Implant Date: 20181205
Implantable Lead Location: 753858
Implantable Lead Location: 753860
Implantable Lead Model: 4398
Implantable Pulse Generator Implant Date: 20181205
Lead Channel Impedance Value: 1007 Ohm
Lead Channel Impedance Value: 1083 Ohm
Lead Channel Impedance Value: 1121 Ohm
Lead Channel Impedance Value: 1349 Ohm
Lead Channel Impedance Value: 1406 Ohm
Lead Channel Impedance Value: 266.667
Lead Channel Impedance Value: 307.746
Lead Channel Impedance Value: 314.527
Lead Channel Impedance Value: 358.564
Lead Channel Impedance Value: 367.802
Lead Channel Impedance Value: 399 Ohm
Lead Channel Impedance Value: 4047 Ohm
Lead Channel Impedance Value: 456 Ohm
Lead Channel Impedance Value: 475 Ohm
Lead Channel Impedance Value: 608 Ohm
Lead Channel Impedance Value: 874 Ohm
Lead Channel Impedance Value: 893 Ohm
Lead Channel Impedance Value: 931 Ohm
Lead Channel Pacing Threshold Amplitude: 0.375 V
Lead Channel Pacing Threshold Pulse Width: 0.4 ms
Lead Channel Sensing Intrinsic Amplitude: 17.125 mV
Lead Channel Sensing Intrinsic Amplitude: 17.125 mV
Lead Channel Setting Pacing Amplitude: 2.25 V
Lead Channel Setting Pacing Amplitude: 2.5 V
Lead Channel Setting Pacing Pulse Width: 0.4 ms
Lead Channel Setting Pacing Pulse Width: 0.6 ms
Lead Channel Setting Sensing Sensitivity: 0.3 mV

## 2020-03-29 NOTE — Progress Notes (Signed)
Remote ICD transmission.   

## 2020-04-01 ENCOUNTER — Telehealth: Payer: Self-pay | Admitting: Family Medicine

## 2020-04-01 NOTE — Telephone Encounter (Signed)
Patient called requesting a refill on his venlafaxine XR (EFFEXOR-XR) 37.5 MG 24 hr capsule to [facility] to [facility] [address].

## 2020-04-02 MED ORDER — VENLAFAXINE HCL ER 37.5 MG PO CP24: 37.5000 mg | ORAL_CAPSULE | Freq: Every day | ORAL | 1 refills | Status: DC

## 2020-04-07 ENCOUNTER — Other Ambulatory Visit: Payer: Self-pay

## 2020-04-07 ENCOUNTER — Telehealth: Payer: Self-pay | Admitting: Family Medicine

## 2020-04-07 MED ORDER — VENLAFAXINE HCL ER 37.5 MG PO CP24: 37.5000 mg | ORAL_CAPSULE | Freq: Every day | ORAL | 1 refills | Status: DC

## 2020-04-07 NOTE — Telephone Encounter (Signed)
We increased dosage of denlafaxine to twice a day, no [facility] and pt is about out of meds. [facility] [address]

## 2020-04-07 NOTE — Telephone Encounter (Signed)
Refilled. Will call patient to notify

## 2020-04-08 ENCOUNTER — Telehealth: Payer: Self-pay

## 2020-04-08 NOTE — Telephone Encounter (Signed)
Left message for patient to remind of missed remote transmission.  

## 2020-04-11 ENCOUNTER — Ambulatory Visit (INDEPENDENT_AMBULATORY_CARE_PROVIDER_SITE_OTHER): Payer: Medicare HMO

## 2020-04-11 ENCOUNTER — Telehealth: Payer: Self-pay

## 2020-04-11 ENCOUNTER — Encounter (HOSPITAL_COMMUNITY): Payer: Medicare HMO

## 2020-04-11 DIAGNOSIS — I5022 Chronic systolic (congestive) heart failure: Secondary | ICD-10-CM

## 2020-04-11 DIAGNOSIS — Z95810 Presence of automatic (implantable) cardiac defibrillator: Secondary | ICD-10-CM

## 2020-04-11 NOTE — Telephone Encounter (Signed)
Call to patient.  He was on his way to get his 2nd COVID vaccine.  He did have COVID in February but decided to take the vaccine.  Advised received ICM remote transmission on 6/25 suggesting possible fluid accumulation.  Requested he send updated transmission this evening when he returns home and will call him tomorrow with the results.

## 2020-04-12 ENCOUNTER — Other Ambulatory Visit: Payer: Self-pay | Admitting: Internal Medicine

## 2020-04-12 DIAGNOSIS — J3089 Other allergic rhinitis: Secondary | ICD-10-CM

## 2020-04-12 DIAGNOSIS — Z8709 Personal history of other diseases of the respiratory system: Secondary | ICD-10-CM

## 2020-04-12 NOTE — Progress Notes (Signed)
EPIC Encounter for ICM Monitoring  Patient Name: [name] is a 64 y.o. male Date: 04/12/2020 Primary Care Physican: [name], MD Primary Cardiologist:[place] Electrophysiologist:Klein Bi-V Pacing: 100% 04/12/2020 Weight: 232 lbs  Spoke with fluid patient.  He reports weight gain of a couple of pounds and both legs are a little swollen but right one is larger.  Optivol thoracic impedancesuggesting possible fluid accumulation since 02/22/2020.  Prescribed:Torsemide 20 mg to 2 tablets (40 mg total)twice a day.  Labs: 03/11/2020 Creatinine 0.80, BUN 15, Potassium 4.8, Sodium 139, GFR >60 12/22/2019 Creatinine 1.08, BUN 24, Potassium 4.0, Sodium 139, GFR >60 12/11/2019 Creatinine 0.84, BUN 17, Potassium 4.0, Sodium 139, GFR >60 12/10/2019 Creatinine 0.91, BUN 18, Potassium 3.5, Sodium 140, GFR >60 12/09/2019 Creatinine 1.24, BUN 27, Potassium 3.5, Sodium 133, GFR >60 11/16/2019 Creatinine 1.06, BUN 21, Potassium 4.2, Sodium 140, GFR >60 11/06/2019 Creatinine 1.08, BUN 21, Potassium 4.5, Sodium 139, GFR >60 A complete set of results can be found in Results Review.  Recommendations:Advised to increase Torsemide to 60 mg AM and 40 mg PM x 2 days.    Follow-up plan: ICM clinic phone appointment on7/11/2019 (manual send) to recheck fluid levels. 91 day device clinic remote transmission9/13/2021.  Next Office appointment 06/13/2020 with HF clinic NP/PA.  Copy of ICM check sent to Dr. [name] and Dr [name].  3 month ICM trend: 04/11/2020    1 Year ICM trend:       [name], RN 04/12/2020 11:55 AM

## 2020-04-12 NOTE — Progress Notes (Signed)
With the up and down in his impedance recently, he can keep torsemide at 60 qam/40 qpm for the time being. BMET 10 days.

## 2020-04-13 DIAGNOSIS — L308 Other specified dermatitis: Secondary | ICD-10-CM | POA: Diagnosis not present

## 2020-04-13 MED ORDER — TORSEMIDE 20 MG PO TABS: ORAL_TABLET | ORAL | 1 refills | Status: DC

## 2020-04-13 NOTE — Progress Notes (Signed)
Spoke with patient.  Advised Dr [name] ordered to stay on Torsemide 60 mg AM and 40 mg PM.  Advised of BMET in 10 days and scheduled 04/22/20 at 2:15 PM.  He agreed to recommendations and verbalized understanding.

## 2020-04-13 NOTE — Addendum Note (Signed)
Addended by: [name] on: 04/13/2020 12:52 PM   Modules accepted: Orders

## 2020-04-16 ENCOUNTER — Other Ambulatory Visit: Payer: Self-pay | Admitting: Internal Medicine

## 2020-04-19 ENCOUNTER — Telehealth: Payer: Self-pay | Admitting: Internal Medicine

## 2020-04-19 NOTE — Progress Notes (Signed)
No ICM remote transmission received for 04/15/2020 and next ICM transmission scheduled for 05/16/2020.

## 2020-04-19 NOTE — Telephone Encounter (Signed)
    Patient requesting copy of June labs be mailed to him.

## 2020-04-20 NOTE — Telephone Encounter (Signed)
Labs has been sent out today.

## 2020-04-22 ENCOUNTER — Other Ambulatory Visit: Payer: Self-pay

## 2020-04-22 ENCOUNTER — Ambulatory Visit (HOSPITAL_COMMUNITY)
Admission: RE | Admit: 2020-04-22 | Discharge: 2020-04-22 | Disposition: A | Discharge status: Home / Self Care | Payer: Medicare HMO | Source: Ambulatory Visit | Attending: Cardiology | Admitting: Cardiology

## 2020-04-22 DIAGNOSIS — I5022 Chronic systolic (congestive) heart failure: Secondary | ICD-10-CM | POA: Insufficient documentation

## 2020-04-22 LAB — BASIC METABOLIC PANEL
Anion gap: 8 (ref 5–15)
BUN: 34 mg/dL — ABNORMAL HIGH (ref 8–23)
CO2: 24 mmol/L (ref 22–32)
Calcium: 8.3 mg/dL — ABNORMAL LOW (ref 8.9–10.3)
Chloride: 107 mmol/L (ref 98–111)
Creatinine, Ser: 1.15 mg/dL (ref 0.61–1.24)
GFR calc Af Amer: >60 mL/min (ref >60)
GFR calc non Af Amer: >60 mL/min (ref >60)
Glucose, Bld: 122 mg/dL — ABNORMAL HIGH (ref 70–99)
Potassium: 3.7 mmol/L (ref 3.5–5.1)
Sodium: 139 mmol/L (ref 135–145)

## 2020-04-24 ENCOUNTER — Other Ambulatory Visit: Payer: Self-pay | Admitting: Internal Medicine

## 2020-04-25 ENCOUNTER — Encounter: Payer: Self-pay | Admitting: Family Medicine

## 2020-04-25 ENCOUNTER — Ambulatory Visit: Payer: Medicare HMO | Admitting: Family Medicine

## 2020-04-25 ENCOUNTER — Other Ambulatory Visit: Payer: Self-pay

## 2020-04-25 VITALS — BP 110/84 | HR 75 | Ht 70.0 in | Wt 243.0 lb

## 2020-04-25 DIAGNOSIS — M1712 Unilateral primary osteoarthritis, left knee: Secondary | ICD-10-CM | POA: Diagnosis not present

## 2020-04-25 DIAGNOSIS — T8484XD Pain due to internal orthopedic prosthetic devices, implants and grafts, subsequent encounter: Secondary | ICD-10-CM

## 2020-04-25 DIAGNOSIS — M12811 Other specific arthropathies, not elsewhere classified, right shoulder: Secondary | ICD-10-CM | POA: Diagnosis not present

## 2020-04-25 DIAGNOSIS — M170 Bilateral primary osteoarthritis of knee: Secondary | ICD-10-CM

## 2020-04-25 DIAGNOSIS — M12812 Other specific arthropathies, not elsewhere classified, left shoulder: Secondary | ICD-10-CM | POA: Diagnosis not present

## 2020-04-25 DIAGNOSIS — Z96651 Presence of right artificial knee joint: Secondary | ICD-10-CM | POA: Diagnosis not present

## 2020-04-25 DIAGNOSIS — M1711 Unilateral primary osteoarthritis, right knee: Secondary | ICD-10-CM | POA: Diagnosis not present

## 2020-04-25 MED ORDER — PREDNISONE 20 MG PO TABS: 20.0000 mg | ORAL_TABLET | Freq: Every day | ORAL | 0 refills | Status: DC

## 2020-04-25 NOTE — Assessment & Plan Note (Signed)
Pain with increasing instability of the right knee.  Has had loosening previously.  Will refer for possible revision

## 2020-04-25 NOTE — Progress Notes (Signed)
[place] Phone: [phone] Subjective:   [name], am serving as a scribe for Dr. Hulan Saas.  I'm seeing this patient by the request  of:  [name], MD  CC: Bilateral knee pain bilateral shoulder pain  KYH:[id number]   02/23/2020 Repeat injection given today.  Tolerated procedure well.  Patient has a chronic problem with exacerbation noted.  We discussed different medications including the gabapentin he was taken previously discussed prednisone which patient has had pain previously and warned of potential side effects.  Patient is on tramadol.  Still having difficulty patient thinks after the Covid infection.  Follow-up again 8 weeks  Repeat injection given today.  Tolerated procedure well.  Patient has a chronic problem with exacerbation noted.  We discussed different medications including the gabapentin he was taken previously discussed prednisone which patient has had pain previously and warned of potential side effects.  Patient is on tramadol.  Still having difficulty patient thinks after the Covid infection.  Follow-up again 8 weeks   Update 04/25/2020 [name] is a 64 y.o. male coming in with complaint of left knee and bilateral shoulder pain. Patient states that his right knee pain is getting worse. Pain in left side did help. Wore off 2 weeks ago.  Patient has had instability of the right knee for quite some time.  Patient feels like he is at a increased risk of falls now at this moment.        Past Medical History:  Diagnosis Date  . AICD (automatic cardioverter/defibrillator) present   . Anemia    supposed to be taking Vit B but doesn't  . ANXIETY    takes Xanax nightly  . Arthritis   . Asthma    Albuterol prn and Advair daily;also takes Prednisone daily  . Atrial fibrillation ([place]) 09/06/2015  . Cardiomyopathy [facility])    a. EF 25% TEE July 2013; b. EF normalized 2015;  c.  03/2015 Echo: EF 40-45%, difrf HK, PASP 38 mmHg, Mild MR, sev LAE/RAE.  [name] Chronic constipation    takes OTC stool softener  . COPD (chronic obstructive pulmonary disease) ([place])    "one dr says COPD; one dr says emphysema" (09/18/2017)  . DEPRESSION    takes Zoloft and Doxepin daily  . Diverticulitis   . DYSKINESIA, ESOPHAGUS   . Emphysema of lung [facility])    "one dr says COPD; one dr says emphysema" (09/18/2017)  . Essential hypertension       . FIBROMYALGIA   . GERD (gastroesophageal reflux disease)       . Glaucoma   . HYPERLIPIDEMIA    a. Intolerant to statins.  . INSOMNIA    takes Ambien nightly  . Myocardial infarction [facility])    a. 2012 Myoview notable for prior infarct;  b. 03/2015 Lexiscan CL: EF 37%, diff HK, small area of inferior infarct from apex to base-->Med Rx.  . Myocardial infarction ([place])   . O2 dependent    "2.5L q hs & prn" (09/18/2017)  . Paroxysmal atrial fibrillation ([facility])    a. CHA2DS2VASc = 3--> takes Coumadin;  b. 03/15/2015 Successful TEE/DCCV;  c. 03/2015 recurrent afib, Amio d/c'd in setting of hyperthyroidism.  . Peripheral neuropathy   . Pneumonia 12/2016  . Rash and other nonspecific skin eruption 04/12/2009   [name] cause found saw dermatologists x 2 and allergist  . SLEEP APNEA, OBSTRUCTIVE    a. doesn't use CPAP  .  Syncope    a. 03/2015 s/p MDT LINQ.  [name] Type II diabetes mellitus ([place])        Past Surgical History:  Procedure Laterality Date  . ACNE CYST REMOVAL     2 on back   . AV NODE ABLATION N/A 10/25/2017   Procedure: AV NODE ABLATION;  Surgeon: Deboraha Sprang, MD;  Location: [place] CV LAB;  Service: Cardiovascular;  Laterality: N/A;  . BIV ICD INSERTION CRT-D N/A 09/18/2017   Procedure: BIV ICD INSERTION CRT-D;  Surgeon: Deboraha Sprang, MD;  Location: [place] CV LAB;  Service: Cardiovascular;  Laterality: N/A;  . CARDIAC CATHETERIZATION N/A 03/21/2016   Procedure: Right/Left Heart Cath and Coronary Angiography;  Surgeon: Larey Dresser,  MD;  Location: [place] CV LAB;  Service: Cardiovascular;  Laterality: N/A;  . CARDIOVERSION  04/18/2012   Procedure: CARDIOVERSION;  Surgeon: Fay Records, MD;  Location: [place];  Service: Cardiovascular;  Laterality: N/A;  . CARDIOVERSION  04/25/2012   Procedure: CARDIOVERSION;  Surgeon: Thayer Headings, MD;  Location: [place];  Service: Cardiovascular;  Laterality: N/A;  . CARDIOVERSION  04/25/2012   Procedure: CARDIOVERSION;  Surgeon: Fay Records, MD;  Location: [place];  Service: Cardiovascular;  Laterality: N/A;  . CARDIOVERSION  05/09/2012   Procedure: CARDIOVERSION;  Surgeon: Sherren Mocha, MD;  Location: [place];  Service: Cardiovascular;  Laterality: N/A;  changed from crenshaw to cooper by trish/leone-endo  . CARDIOVERSION N/A 03/15/2015   Procedure: CARDIOVERSION;  Surgeon: Thayer Headings, MD;  Location: [facility] ENDOSCOPY;  Service: Cardiovascular;  Laterality: N/A;  . COLONOSCOPY    . COLONOSCOPY WITH PROPOFOL N/A 10/21/2014   Procedure: COLONOSCOPY WITH PROPOFOL;  Surgeon: Ladene Artist, MD;  Location: [facility] ENDOSCOPY;  Service: Endoscopy;  Laterality: N/A;  . EP IMPLANTABLE DEVICE N/A 04/06/2015   Procedure: Loop Recorder Insertion;  Surgeon: Evans Lance, MD;  Location: [place] CV LAB;  Service: Cardiovascular;  Laterality: N/A;  . ESOPHAGOGASTRODUODENOSCOPY    . JOINT REPLACEMENT    . LOOP RECORDER REMOVAL N/A 09/18/2017   Procedure: LOOP RECORDER REMOVAL;  Surgeon: Deboraha Sprang, MD;  Location: [place] CV LAB;  Service: Cardiovascular;  Laterality: N/A;  . RIGHT/LEFT HEART CATH AND CORONARY ANGIOGRAPHY N/A 01/28/2017   Procedure: Right/Left Heart Cath and Coronary Angiography;  Surgeon: Larey Dresser, MD;  Location: [place] CV LAB;  Service: Cardiovascular;  Laterality: N/A;  . TEE WITHOUT CARDIOVERSION  04/25/2012   Procedure: TRANSESOPHAGEAL ECHOCARDIOGRAM (TEE);  Surgeon: Thayer Headings, MD;  Location: [place];  Service: Cardiovascular;  Laterality: N/A;  .  TEE WITHOUT CARDIOVERSION N/A 03/15/2015   Procedure: TRANSESOPHAGEAL ECHOCARDIOGRAM (TEE);  Surgeon: Thayer Headings, MD;  Location: [place];  Service: Cardiovascular;  Laterality: N/A;  . TONSILLECTOMY AND ADENOIDECTOMY    . TOTAL KNEE ARTHROPLASTY Right 06/15/2014   Procedure: TOTAL KNEE ARTHROPLASTY;  Surgeon: Renette Butters, MD;  Location: [place];  Service: Orthopedics;  Laterality: Right;   Social History   Socioeconomic History  . Marital status: Divorced    Spouse name: Not on file  . Number of children: 2  . Years of education: Not on file  . Highest education level: Not on file  Occupational History  . Occupation: retired/disabled. prev worked in Therapist, sports.    Employer: DISABLED  Tobacco Use  . Smoking status: Former Smoker    Packs/day: 2.00    Years: 30.00    Pack years: 60.00  Types: Cigarettes    Quit date: 10/16/2007    Years since quitting: 12.5  . Smokeless tobacco: Never Used  Vaping Use  . Vaping Use: Never used  Substance and Sexual Activity  . Alcohol use: [name]  . Drug use: [name]  . Sexual activity: Not Currently  Other Topics Concern  . Not on file  Social History Narrative   Lives alone.   Social Determinants of Health   Financial Resource Strain:   . Difficulty of Paying Living Expenses:   Food Insecurity:   . Worried About Charity fundraiser in the Last Year:   . Arboriculturist in the Last Year:   Transportation Needs:   . Film/video editor (Medical):   [name] Lack of Transportation (Non-Medical):   Physical Activity:   . Days of Exercise per Week:   . Minutes of Exercise per Session:   Stress:   . Feeling of Stress :   Social Connections:   . Frequency of Communication with Friends and Family:   . Frequency of Social Gatherings with Friends and Family:   . Attends Religious Services:   . Active Member of Clubs or Organizations:   . Attends Archivist Meetings:   [name] Marital Status:    Allergies  Allergen Reactions    . Amiodarone Other (See Comments)    hyperthyroidism  . Statins Other (See Comments)    myalgia  . Tape Other (See Comments)    Skin Tears Use Paper Tape Only   Family History  Problem Relation Age of Onset  . COPD Mother   . Asthma Mother   . Colon polyps Mother   . Allergies Mother   . Hypothyroidism Mother   . Asthma Maternal Grandmother   . Colon cancer Neg Hx     Current Outpatient Medications (Endocrine & Metabolic):  .  predniSONE (DELTASONE) 20 MG tablet, Take 1 tablet (20 mg total) by mouth daily with breakfast.  Current Outpatient Medications (Cardiovascular):  .  carvedilol (COREG) 12.5 MG tablet, TAKE 1 TABLET (12.5 MG TOTAL) BY MOUTH 2 (TWO) TIMES DAILY WITH A MEAL. [name]  digoxin (LANOXIN) 0.25 MG tablet, TAKE 1/2 TABLET BY MOUTH EVERY DAY .  sacubitril-valsartan (ENTRESTO) 24-26 MG, Take 1 tablet by mouth 2 (two) times daily. [name]  spironolactone (ALDACTONE) 25 MG tablet, TAKE 1 TABLET BY MOUTH EVERY DAY .  torsemide (DEMADEX) 20 MG tablet, Take 3 tablets (60 mg total) every morning and 2 tablets (40 mg total) every evening.  Current Outpatient Medications (Respiratory):  .  albuterol (VENTOLIN HFA) 108 (90 Base) MCG/ACT inhaler, INHALE 2 PUFFS 4 TIMES A DAY as needed .  fluticasone (FLONASE) 50 MCG/ACT nasal spray, PLACE 2 SPRAYS INTO BOTH NOSTRILS DAILY AS NEEDED FOR ALLERGIES OR RHINITIS.  Current Outpatient Medications (Analgesics):  .  acetaminophen (TYLENOL) 500 MG tablet, Take 500 mg by mouth every 6 (six) hours as needed. .  traMADol (ULTRAM) 50 MG tablet, Take 1 tablet (50 mg total) by mouth daily as needed.   Current Outpatient Medications (Other):  [name]  ALPRAZolam (XANAX) 0.5 MG tablet, TAKE 2 TABLETS BY MOUTH AT BEDTIME .  carisoprodol (SOMA) 350 MG tablet, TAKE 1 TABLET BY MOUTH THREE TIMES A DAY AS NEEDED FOR MUSCLE SPASMS .  doxepin (SINEQUAN) 10 MG capsule, TAKE 2 CAPSULES BY MOUTH AT BEDTIME. ANNUAL APPT DUE IN JUNE MUST SEE PROVIDER FOR FUTURE  REFILLS .  hydrocortisone 2.5 % cream, APPLY TO AFFECTED AREA TWICE A DAY .  hydrOXYzine (ATARAX/VISTARIL) 10 MG tablet, TAKE 1 TABLET (10 MG TOTAL) BY MOUTH 3 (THREE) TIMES DAILY AS NEEDED FOR ITCHING. .  lidocaine (LIDODERM) 5 %, APPLY 1 PATCH TO AFFECTED AREA FOR UP TO 12 HOURS . DO NOT USE MORE THAN ONE PATCH IN 24 HOURS. .  methotrexate (RHEUMATREX) 2.5 MG tablet, TAKE 4 TABLETS BY MOUTH EVERY WEEK .  omeprazole (PRILOSEC) 20 MG capsule, Take 1 capsule (20 mg total) by mouth daily with supper. .  silver sulfADIAZINE (SILVADENE) 1 % cream, APPLY TOPICALLY TO AFFECTED AREA EVERY DAY .  tamsulosin (FLOMAX) 0.4 MG CAPS capsule,  .  traZODone (DESYREL) 50 MG tablet, TAKE 1 TABLET (50 MG TOTAL) BY MOUTH AT BEDTIME AS NEEDED FOR SLEEP. [name]  triamcinolone (KENALOG) 0.1 % paste, APPLY 1 APPLICATION IN THE MOUTH OR THROAT 2 (TWO) TIMES DAILY AS DIRECTED .  TUMS 500 MG chewable tablet, Chew 500-2,000 mg by mouth every 4 (four) hours as needed for indigestion or heartburn.  .  valACYclovir (VALTREX) 1000 MG tablet, TAKE 1 TABLET BY MOUTH THREE TIMES A DAY .  venlafaxine XR (EFFEXOR-XR) 37.5 MG 24 hr capsule, Take 1 capsule (37.5 mg total) by mouth daily with breakfast. .  zolpidem (AMBIEN) 10 MG tablet, TAKE 1 TABLET BY MOUTH EVERYDAY AT BEDTIME   Reviewed prior external information including notes and imaging from  primary care provider As well as notes that were available from care everywhere and other healthcare systems.  Past medical history, social, surgical and family history all reviewed in electronic medical record.  [name] pertanent information unless stated regarding to the chief complaint.   Review of Systems:  [name] headache, visual changes, nausea, vomiting, diarrhea, constipation, dizziness, abdominal pain, skin rash, fevers, chills, night sweats, weight loss, swollen lymph nodes, chest pain, shortness of breath, mood changes. POSITIVE muscle aches, body aches, joint swelling  Objective   Blood pressure 110/84, pulse 75, height 5\' 10"  (1.778 m), weight 243 lb (110.2 kg), SpO2 97 %.   General: [name] apparent distress alert and oriented x3 mood and affect normal, dressed appropriately.  HEENT: Pupils equal, extraocular movements intact  Severely antalgic gait.  Bilateral shoulder exam showed the patient does have mild atrophy of the musculature.  Crepitus noted.  4 out of 5 strength of rotator cuff bilaterally.  5 out of 5 strength noted otherwise in the hands.  Right knee shows replacement.  Instability with valgus and varus force with a audible click noted.  Knee: Left valgus deformity noted. Large thigh to calf ratio.  Tender to palpation over medial and PF joint line.  ROM full in flexion and extension and lower leg rotation. instability with valgus force.  painful patellar compression. Patellar glide with moderate crepitus. Patellar and quadriceps tendons unremarkable. Hamstring and quadriceps strength is normal.  After informed written and verbal consent, patient was seated on exam table. Left knee was prepped with alcohol swab and utilizing anterolateral approach, patient's left knee space was injected with 4:1  marcaine 0.5%: Kenalog 40mg /dL. Patient tolerated the procedure well without immediate complications.  After informed written and verbal consent, patient was seated on exam table. Right shoulder was prepped with alcohol swab and utilizing posterior approach, patient's right glenohumeral space was injected with 4:1  marcaine 0.5%: Kenalog 40mg /dL. Patient tolerated the procedure well without immediate complications.  After informed written and verbal consent, patient was seated on exam table. Left shoulder was prepped with alcohol swab and utilizing posterior approach, patient's right glenohumeral space was injected with  4:1  marcaine 0.5%: Kenalog 40mg /dL. Patient tolerated the procedure well without immediate complications.   Impression and Recommendations:      The above documentation has been reviewed and is accurate and complete Lyndal Pulley, DO       Note: This dictation was prepared with Dragon dictation along with smaller phrase technology. Any transcriptional errors that result from this process are unintentional.

## 2020-04-25 NOTE — Assessment & Plan Note (Signed)
Bilateral injections given.  Discussed icing regimen and home exercise, which activities to do which wants to avoid.  Increase activity slowly.  Follow-up again in 10-12 weeks.

## 2020-04-25 NOTE — Assessment & Plan Note (Signed)
repeat injection given April 25, 2020 chronic problem with exacerbation.  Discussed the other medications.  Patient given some prednisone to take when intermittently.  Patient knows to monitor blood sugars.  Discussed icing regimen at home exercise, which activities to do which wants to avoid.  Increase activity slowly.  Follow-up again in 10 to 12 weeks patient will likely need placement at some point but does have instability noted of the right knee that is more concerning at the moment.

## 2020-04-25 NOTE — Patient Instructions (Signed)
See me in 10-12 weeks 

## 2020-04-26 ENCOUNTER — Other Ambulatory Visit (HOSPITAL_COMMUNITY): Payer: Self-pay | Admitting: Cardiology

## 2020-04-26 ENCOUNTER — Other Ambulatory Visit: Payer: Self-pay | Admitting: Internal Medicine

## 2020-04-26 NOTE — Telephone Encounter (Signed)
Please refill as per office routine med refill policy (all routine meds refilled for 3 mo or monthly per pt preference up to one year from last visit, then month to month grace period for 3 mo, then further med refills will have to be denied)  

## 2020-04-26 NOTE — Telephone Encounter (Signed)
° ° °  Patient states he never got labs in the mail, he has an appointment on 7/14 with a specialist. Would like to pick up lab results/

## 2020-04-27 DIAGNOSIS — B9689 Other specified bacterial agents as the cause of diseases classified elsewhere: Secondary | ICD-10-CM | POA: Diagnosis not present

## 2020-04-27 DIAGNOSIS — L02229 Furuncle of trunk, unspecified: Secondary | ICD-10-CM | POA: Diagnosis not present

## 2020-04-27 NOTE — Telephone Encounter (Signed)
Spoke to pt, he received his letter of results today in the mail and I have mailed another copy to him per his request.

## 2020-04-30 ENCOUNTER — Other Ambulatory Visit: Payer: Self-pay | Admitting: Internal Medicine

## 2020-04-30 DIAGNOSIS — I4819 Other persistent atrial fibrillation: Secondary | ICD-10-CM

## 2020-04-30 DIAGNOSIS — I255 Ischemic cardiomyopathy: Secondary | ICD-10-CM

## 2020-04-30 DIAGNOSIS — I428 Other cardiomyopathies: Secondary | ICD-10-CM

## 2020-04-30 NOTE — Telephone Encounter (Signed)
Done erx 

## 2020-05-01 ENCOUNTER — Other Ambulatory Visit: Payer: Self-pay | Admitting: Internal Medicine

## 2020-05-10 DIAGNOSIS — Z96651 Presence of right artificial knee joint: Secondary | ICD-10-CM | POA: Diagnosis not present

## 2020-05-10 DIAGNOSIS — M25561 Pain in right knee: Secondary | ICD-10-CM | POA: Diagnosis not present

## 2020-05-12 ENCOUNTER — Other Ambulatory Visit: Payer: Self-pay | Admitting: Internal Medicine

## 2020-05-16 ENCOUNTER — Ambulatory Visit (INDEPENDENT_AMBULATORY_CARE_PROVIDER_SITE_OTHER): Payer: Medicare HMO

## 2020-05-16 ENCOUNTER — Telehealth (HOSPITAL_COMMUNITY): Payer: Self-pay | Admitting: *Deleted

## 2020-05-16 DIAGNOSIS — I5022 Chronic systolic (congestive) heart failure: Secondary | ICD-10-CM

## 2020-05-16 DIAGNOSIS — Z95810 Presence of automatic (implantable) cardiac defibrillator: Secondary | ICD-10-CM | POA: Diagnosis not present

## 2020-05-16 NOTE — Telephone Encounter (Signed)
Talk with pharmacy to see if there is any way we could get this for him more cheaply.  Needs to restart Eliquis 5 mg bid (could use Xarelto if this is cheaper) then needs DCCV if he remains in atrial fibrillation (after 4 wks on Eliquis).

## 2020-05-16 NOTE — Progress Notes (Signed)
EPIC Encounter for ICM Monitoring  Patient Name: [name] is a 64 y.o. male Date: 05/16/2020 Primary Care Physican: [name], MD Primary Cardiologist:[place] Electrophysiologist:[place] Bi-V Pacing:100% 04/12/2020 Weight: 232 lbs  Attempted call to patient and unable to reach.  Left detailed message per DPR regarding transmission. Transmission reviewed.   Optivol thoracic impedancenormal.  Prescribed:  Torsemide 20 mg to 3 tablets (60 mg total)and 2 tablets (40 mg total) every evening.  Spironolactone 25 mg take 1 tablet daily.  Labs: 04/22/2020 Creatinine 1.15, BUN 34, Potassium 3.7, Sodium 139, GFR >60 03/11/2020 Creatinine 0.80, BUN 15, Potassium 4.8, Sodium 139, GFR >60 12/22/2019 Creatinine 1.08, BUN 24, Potassium 4.0, Sodium 139, GFR >60 A complete set of results can be found in Results Review.  Recommendations:Left voice mail with ICM number and encouraged to call if experiencing any fluid symptoms.  Follow-up plan: ICM clinic phone appointment on9/14/2021. 91 day device clinic remote transmission9/13/2021.   EP/Cardiology Office Visits: 06/13/2020 with [facility] clinic PA/NP.    Copy of ICM check sent to Dr. [name].   3 month ICM trend: 05/16/2020    1 Year ICM trend:       [name], RN 05/16/2020 3:45 PM

## 2020-05-16 NOTE — Telephone Encounter (Signed)
Pt called today stating home health told him to call and report that he is in afib. Pt was asked by home health if was taking eliquis patient said he was not taking it he could not afford it . Pt was supposed to restart eliquis on 03/11/20 but did not do to cost. Pt is asymptomatic. I checked with Jodelle Gross, CPhT she said patients copay is $45/month.    Routed to [place] for advice.

## 2020-05-16 NOTE — Telephone Encounter (Signed)
I talked to Kathlee Nations and she will start patient assistance application and reach out to patient for Eliquis.

## 2020-05-17 ENCOUNTER — Telehealth (HOSPITAL_COMMUNITY): Payer: Self-pay | Admitting: Pharmacy Technician

## 2020-05-17 ENCOUNTER — Telehealth: Payer: Self-pay | Admitting: Family Medicine

## 2020-05-17 ENCOUNTER — Other Ambulatory Visit: Payer: Self-pay

## 2020-05-17 MED ORDER — VENLAFAXINE HCL ER 75 MG PO CP24
75.0000 mg | ORAL_CAPSULE | Freq: Every day | ORAL | 0 refills | Status: DC
Start: 2020-05-17 — End: 2020-06-14

## 2020-05-17 NOTE — Telephone Encounter (Signed)
Rx called in . Patient notified

## 2020-05-17 NOTE — Telephone Encounter (Signed)
Patient called requesting a refill on venlafaxine XR (EFFEXOR-XR) 37.5 MG 24 hr capsule. He said that he has been taking two a day as instructed by Dr Tamala Julian.  Pharmacy: [facility] on [id number]

## 2020-05-17 NOTE — Telephone Encounter (Signed)
Spoke with patient, will mail him assistance application.  Will follow up.

## 2020-05-17 NOTE — Telephone Encounter (Signed)
Called and left message to start patient assistance application for Eliquis.  Will follow up.

## 2020-05-19 ENCOUNTER — Other Ambulatory Visit: Payer: Self-pay | Admitting: Internal Medicine

## 2020-05-26 ENCOUNTER — Telehealth: Payer: Self-pay

## 2020-05-26 NOTE — Telephone Encounter (Signed)
New message  The patient is asking about a COVID vaccine booster -needs the CMA to call him back

## 2020-05-26 NOTE — Telephone Encounter (Signed)
Any day now the CDC is supposed to advise on a booster shot for immunocompromised patients  We are still watiing to see which immunecompromised pt they refer to  If they have DM on the immunocompromised list, then the pt would be a good candidate for a booster.  But again, no recommendation has been put forward by the CDC yet so I cannot really say

## 2020-05-27 NOTE — Telephone Encounter (Signed)
Spoke with patient.

## 2020-06-06 ENCOUNTER — Other Ambulatory Visit: Payer: Self-pay | Admitting: Internal Medicine

## 2020-06-06 DIAGNOSIS — L308 Other specified dermatitis: Secondary | ICD-10-CM

## 2020-06-10 ENCOUNTER — Other Ambulatory Visit: Payer: Self-pay | Admitting: Family Medicine

## 2020-06-10 DIAGNOSIS — Z20822 Contact with and (suspected) exposure to covid-19: Secondary | ICD-10-CM | POA: Diagnosis not present

## 2020-06-13 ENCOUNTER — Encounter (HOSPITAL_COMMUNITY): Payer: Medicare HMO

## 2020-06-15 NOTE — Progress Notes (Signed)
[place] Phone: [phone] Subjective:   [name], am serving as a scribe for Dr. Hulan Saas. This visit occurred during the SARS-CoV-2 public health emergency.  Safety protocols were in place, including screening questions prior to the visit, additional usage of staff PPE, and extensive cleaning of exam room while observing appropriate contact time as indicated for disinfecting solutions.   I'm seeing this patient by the request  of:  [name], MD  CC: Bilateral knee bilateral shoulder pain  [id number]   04/25/2020 Bilateral injections given.  Discussed icing regimen and home exercise, which activities to do which wants to avoid.  Increase activity slowly.  Follow-up again in 10-12 weeks.  Pain with increasing instability of the right knee.  Has had loosening previously.  Will refer for possible revision   repeat injection given April 25, 2020 chronic problem with exacerbation.  Discussed the other medications.  Patient given some prednisone to take when intermittently.  Patient knows to monitor blood sugars.  Discussed icing regimen at home exercise, which activities to do which wants to avoid.  Increase activity slowly.  Follow-up again in 10 to 12 weeks patient will likely need placement at some point but does have instability noted of the right knee that is more concerning at the moment.  Update 06/16/2020 [name] is a 64 y.o. male coming in with complaint of bilateral shoulder pain, right knee TKR instability, and left knee pain. Patient states that he would like injections in shoulder and knee.  Patient does have a break of the right knee replacement but is unable to get it fixed.  Left knee arthritis plan patient was told that they feel he is too high of a surgical risk at this time  Would like referral to foot and ankle for bunion, right foot.    Past Medical History:  Diagnosis Date    . AICD (automatic cardioverter/defibrillator) present   . Anemia    supposed to be taking Vit B but doesn't  . ANXIETY    takes Xanax nightly  . Arthritis   . Asthma    Albuterol prn and Advair daily;also takes Prednisone daily  . Atrial fibrillation ([place]) 09/06/2015  . Cardiomyopathy [facility])    a. EF 25% TEE July 2013; b. EF normalized 2015;  c. 03/2015 Echo: EF 40-45%, difrf HK, PASP 38 mmHg, Mild MR, sev LAE/RAE.  [name] Chronic constipation    takes OTC stool softener  . COPD (chronic obstructive pulmonary disease) ([place])    "one dr says COPD; one dr says emphysema" (09/18/2017)  . DEPRESSION    takes Zoloft and Doxepin daily  . Diverticulitis   . DYSKINESIA, ESOPHAGUS   . Emphysema of lung [facility])    "one dr says COPD; one dr says emphysema" (09/18/2017)  . Essential hypertension       . FIBROMYALGIA   . GERD (gastroesophageal reflux disease)       . Glaucoma   . HYPERLIPIDEMIA    a. Intolerant to statins.  . INSOMNIA    takes Ambien nightly  . Myocardial infarction [facility])    a. 2012 Myoview notable for prior infarct;  b. 03/2015 Lexiscan CL: EF 37%, diff HK, small area of inferior infarct from apex to base-->Med Rx.  . Myocardial infarction ([place])   . O2 dependent    "2.5L q hs & prn" (09/18/2017)  . Paroxysmal atrial fibrillation (H[facility])    a. CHA2DS2VASc =  3--> takes Coumadin;  b. 03/15/2015 Successful TEE/DCCV;  c. 03/2015 recurrent afib, Amio d/c'd in setting of hyperthyroidism.  . Peripheral neuropathy   . Pneumonia 12/2016  . Rash and other nonspecific skin eruption 04/12/2009   [name] cause found saw dermatologists x 2 and allergist  . SLEEP APNEA, OBSTRUCTIVE    a. doesn't use CPAP  . Syncope    a. 03/2015 s/p MDT LINQ.  [name] Type II diabetes mellitus ([place])        Past Surgical History:  Procedure Laterality Date  . ACNE CYST REMOVAL     2 on back   . AV NODE ABLATION N/A 10/25/2017   Procedure: AV NODE ABLATION;  Surgeon: Deboraha Sprang, MD;  Location: [place] CV LAB;   Service: Cardiovascular;  Laterality: N/A;  . BIV ICD INSERTION CRT-D N/A 09/18/2017   Procedure: BIV ICD INSERTION CRT-D;  Surgeon: Deboraha Sprang, MD;  Location: [place] CV LAB;  Service: Cardiovascular;  Laterality: N/A;  . CARDIAC CATHETERIZATION N/A 03/21/2016   Procedure: Right/Left Heart Cath and Coronary Angiography;  Surgeon: Larey Dresser, MD;  Location: [place] CV LAB;  Service: Cardiovascular;  Laterality: N/A;  . CARDIOVERSION  04/18/2012   Procedure: CARDIOVERSION;  Surgeon: Fay Records, MD;  Location: [place];  Service: Cardiovascular;  Laterality: N/A;  . CARDIOVERSION  04/25/2012   Procedure: CARDIOVERSION;  Surgeon: Thayer Headings, MD;  Location: [place];  Service: Cardiovascular;  Laterality: N/A;  . CARDIOVERSION  04/25/2012   Procedure: CARDIOVERSION;  Surgeon: Fay Records, MD;  Location: [place];  Service: Cardiovascular;  Laterality: N/A;  . CARDIOVERSION  05/09/2012   Procedure: CARDIOVERSION;  Surgeon: Sherren Mocha, MD;  Location: [place];  Service: Cardiovascular;  Laterality: N/A;  changed from crenshaw to cooper by trish/leone-endo  . CARDIOVERSION N/A 03/15/2015   Procedure: CARDIOVERSION;  Surgeon: Thayer Headings, MD;  Location: [facility] ENDOSCOPY;  Service: Cardiovascular;  Laterality: N/A;  . COLONOSCOPY    . COLONOSCOPY WITH PROPOFOL N/A 10/21/2014   Procedure: COLONOSCOPY WITH PROPOFOL;  Surgeon: Ladene Artist, MD;  Location: [facility] ENDOSCOPY;  Service: Endoscopy;  Laterality: N/A;  . EP IMPLANTABLE DEVICE N/A 04/06/2015   Procedure: Loop Recorder Insertion;  Surgeon: Evans Lance, MD;  Location: [place] CV LAB;  Service: Cardiovascular;  Laterality: N/A;  . ESOPHAGOGASTRODUODENOSCOPY    . JOINT REPLACEMENT    . LOOP RECORDER REMOVAL N/A 09/18/2017   Procedure: LOOP RECORDER REMOVAL;  Surgeon: Deboraha Sprang, MD;  Location: [place] CV LAB;  Service: Cardiovascular;  Laterality: N/A;  . RIGHT/LEFT HEART CATH AND CORONARY ANGIOGRAPHY N/A 01/28/2017    Procedure: Right/Left Heart Cath and Coronary Angiography;  Surgeon: Larey Dresser, MD;  Location: [place] CV LAB;  Service: Cardiovascular;  Laterality: N/A;  . TEE WITHOUT CARDIOVERSION  04/25/2012   Procedure: TRANSESOPHAGEAL ECHOCARDIOGRAM (TEE);  Surgeon: Thayer Headings, MD;  Location: [place];  Service: Cardiovascular;  Laterality: N/A;  . TEE WITHOUT CARDIOVERSION N/A 03/15/2015   Procedure: TRANSESOPHAGEAL ECHOCARDIOGRAM (TEE);  Surgeon: Thayer Headings, MD;  Location: [place];  Service: Cardiovascular;  Laterality: N/A;  . TONSILLECTOMY AND ADENOIDECTOMY    . TOTAL KNEE ARTHROPLASTY Right 06/15/2014   Procedure: TOTAL KNEE ARTHROPLASTY;  Surgeon: Renette Butters, MD;  Location: [place];  Service: Orthopedics;  Laterality: Right;   Social History   Socioeconomic History  . Marital status: Divorced    Spouse name: Not on file  . Number of children:  2  . Years of education: Not on file  . Highest education level: Not on file  Occupational History  . Occupation: retired/disabled. prev worked in Therapist, sports.    Employer: DISABLED  Tobacco Use  . Smoking status: Former Smoker    Packs/day: 2.00    Years: 30.00    Pack years: 60.00    Types: Cigarettes    Quit date: 10/16/2007    Years since quitting: 12.6  . Smokeless tobacco: Never Used  Vaping Use  . Vaping Use: Never used  Substance and Sexual Activity  . Alcohol use: [name]  . Drug use: [name]  . Sexual activity: Not Currently  Other Topics Concern  . Not on file  Social History Narrative   Lives alone.   Social Determinants of Health   Financial Resource Strain:   . Difficulty of Paying Living Expenses: Not on file  Food Insecurity:   . Worried About Charity fundraiser in the Last Year: Not on file  . Ran Out of Food in the Last Year: Not on file  Transportation Needs:   . Lack of Transportation (Medical): Not on file  . Lack of Transportation (Non-Medical): Not on file  Physical Activity:   . Days  of Exercise per Week: Not on file  . Minutes of Exercise per Session: Not on file  Stress:   . Feeling of Stress : Not on file  Social Connections:   . Frequency of Communication with Friends and Family: Not on file  . Frequency of Social Gatherings with Friends and Family: Not on file  . Attends Religious Services: Not on file  . Active Member of Clubs or Organizations: Not on file  . Attends Archivist Meetings: Not on file  . Marital Status: Not on file   Allergies  Allergen Reactions  . Amiodarone Other (See Comments)    hyperthyroidism  . Statins Other (See Comments)    myalgia  . Tape Other (See Comments)    Skin Tears Use Paper Tape Only   Family History  Problem Relation Age of Onset  . COPD Mother   . Asthma Mother   . Colon polyps Mother   . Allergies Mother   . Hypothyroidism Mother   . Asthma Maternal Grandmother   . Colon cancer Neg Hx     Current Outpatient Medications (Endocrine & Metabolic):  .  predniSONE (DELTASONE) 20 MG tablet, Take 1 tablet (20 mg total) by mouth daily with breakfast.  Current Outpatient Medications (Cardiovascular):  .  carvedilol (COREG) 12.5 MG tablet, TAKE 1 TABLET (12.5 MG TOTAL) BY MOUTH 2 (TWO) TIMES DAILY WITH A MEAL. [name]  digoxin (LANOXIN) 0.25 MG tablet, TAKE 1/2 TABLET BY MOUTH EVERY DAY .  sacubitril-valsartan (ENTRESTO) 24-26 MG, Take 1 tablet by mouth 2 (two) times daily. [name]  spironolactone (ALDACTONE) 25 MG tablet, TAKE 1 TABLET BY MOUTH EVERY DAY .  torsemide (DEMADEX) 20 MG tablet, Take 3 tablets (60 mg total) every morning and 2 tablets (40 mg total) every evening.  Current Outpatient Medications (Respiratory):  .  albuterol (VENTOLIN HFA) 108 (90 Base) MCG/ACT inhaler, INHALE 2 PUFFS 4 TIMES A DAY as needed .  fluticasone (FLONASE) 50 MCG/ACT nasal spray, PLACE 2 SPRAYS INTO BOTH NOSTRILS DAILY AS NEEDED FOR ALLERGIES OR RHINITIS.  Current Outpatient Medications (Analgesics):  .  acetaminophen (TYLENOL) 500  MG tablet, Take 500 mg by mouth every 6 (six) hours as needed. .  traMADol (ULTRAM) 50 MG tablet, Take 1  tablet (50 mg total) by mouth daily as needed.   Current Outpatient Medications (Other):  [name]  ALPRAZolam (XANAX) 0.5 MG tablet, TAKE 2 TABLETS BY MOUTH AT BEDTIME .  carisoprodol (SOMA) 350 MG tablet, TAKE 1 TABLET BY MOUTH THREE TIMES A DAY AS NEEDED FOR MUSCLE SPASMS .  doxepin (SINEQUAN) 10 MG capsule, TAKE 2 CAPSULES BY MOUTH AT BEDTIME. ANNUAL APPT DUE IN JUNE MUST SEE PROVIDER FOR FUTURE REFILLS .  hydrocortisone 2.5 % cream, APPLY TO AFFECTED AREA TWICE A DAY .  hydrOXYzine (ATARAX/VISTARIL) 10 MG tablet, TAKE 1 TABLET (10 MG TOTAL) BY MOUTH 3 (THREE) TIMES DAILY AS NEEDED FOR ITCHING. .  lidocaine (LIDODERM) 5 %, APPLY 1 PATCH TO AFFECTED AREA FOR UP TO 12 HOURS . DO NOT USE MORE THAN ONE PATCH IN 24 HOURS. .  methotrexate (RHEUMATREX) 2.5 MG tablet, TAKE 4 TABLETS BY MOUTH EVERY WEEK .  omeprazole (PRILOSEC) 20 MG capsule, TAKE 1 CAPSULE BY MOUTH TWICE A DAY .  silver sulfADIAZINE (SILVADENE) 1 % cream, APPLY TOPICALLY TO AFFECTED AREA EVERY DAY .  tamsulosin (FLOMAX) 0.4 MG CAPS capsule,  .  traZODone (DESYREL) 100 MG tablet, TAKE 1/2 TABLET BY MOUTH EVERY DAY AT BEDTIME AS NEEDED .  traZODone (DESYREL) 50 MG tablet, TAKE 1 TABLET (50 MG TOTAL) BY MOUTH AT BEDTIME AS NEEDED FOR SLEEP. [name]  triamcinolone (KENALOG) 0.1 % paste, APPLY 1 APPLICATION IN THE MOUTH OR THROAT 2 (TWO) TIMES DAILY AS DIRECTED .  TUMS 500 MG chewable tablet, Chew 500-2,000 mg by mouth every 4 (four) hours as needed for indigestion or heartburn.  .  valACYclovir (VALTREX) 1000 MG tablet, TAKE 1 TABLET BY MOUTH THREE TIMES A DAY .  venlafaxine XR (EFFEXOR-XR) 37.5 MG 24 hr capsule, Take 1 capsule (37.5 mg total) by mouth daily with breakfast. .  venlafaxine XR (EFFEXOR-XR) 75 MG 24 hr capsule, TAKE 1 CAPSULE (75 MG TOTAL) BY MOUTH DAILY WITH BREAKFAST. [name]  zolpidem (AMBIEN) 10 MG tablet, TAKE 1 TABLET BY MOUTH  EVERYDAY AT BEDTIME   Reviewed prior external information including notes and imaging from  primary care provider As well as notes that were available from care everywhere and other healthcare systems.  Past medical history, social, surgical and family history all reviewed in electronic medical record.  [name] pertanent information unless stated regarding to the chief complaint.   Review of Systems:  [name] headache, visual changes, nausea, vomiting, diarrhea, constipation, dizziness, abdominal pain, skin rash, fevers, chills, night sweats, weight loss, swollen lymph nodes, body aches, joint swelling, chest pain, shortness of breath, mood changes. POSITIVE muscle aches  Objective  Blood pressure 122/72, pulse 91, height 5\' 10"  (1.778 m), weight 247 lb (112 kg), SpO2 96 %.   General: [name] apparent distress alert and oriented x3 mood and affect normal, dressed appropriately.  HEENT: Pupils equal, extraocular movements intact  Respiratory: Patient's speak in full sentences and does not appear short of breath  Cardiovascular: [name] lower extremity edema, non tender, [name] erythema  Neuro: Cranial nerves II through XII are intact, neurovascularly intact in all extremities with 2+ DTRs and 2+ pulses.  Gait antalgic   MSK: Arthritic changes of multiple joints. Right knee replacement still tender to palpation anteriorly Left knee significant arthritic changes.  Limited range of motion in extension and flexion. Bilateral shoulders do have atrophy bilaterally.  Does have some decreased range of motion.  4-5 strength of the rotator cuff bilaterally.  Tender to palpation diffusely.  After informed written and  verbal consent, patient was seated on exam table. Left knee was prepped with alcohol swab and utilizing anterolateral approach, patient's left knee space was injected with 4:1  marcaine 0.5%: Kenalog 40mg /dL. Patient tolerated the procedure well without immediate complications.  Procedure: Real-time Ultrasound  Guided Injection of right glenohumeral joint Device: GE Logiq Q7  Ultrasound guided injection is preferred based studies that show increased duration, increased effect, greater accuracy, decreased procedural pain, increased response rate with ultrasound guided versus blind injection.  Verbal informed consent obtained.  Time-out conducted.  Noted [name] overlying erythema, induration, or other signs of local infection.  Skin prepped in a sterile fashion.  Local anesthesia: Topical Ethyl chloride.  With sterile technique and under real time ultrasound guidance:  Joint visualized.  23g 1  inch needle inserted posterior approach. Pictures taken for needle placement. Patient did have injection of 2 cc of 1% lidocaine, 2 cc of 0.5% Marcaine, and 1.0 cc of Kenalog 40 mg/dL. Completed without difficulty  Pain immediately resolved suggesting accurate placement of the medication.  Advised to call if fevers/chills, erythema, induration, drainage, or persistent bleeding.  Impression: Technically successful ultrasound guided injection.  Procedure: Real-time Ultrasound Guided Injection of left glenohumeral joint Device: GE Logiq E  Ultrasound guided injection is preferred based studies that show increased duration, increased effect, greater accuracy, decreased procedural pain, increased response rate with ultrasound guided versus blind injection.  Verbal informed consent obtained.  Time-out conducted.  Noted [name] overlying erythema, induration, or other signs of local infection.  Skin prepped in a sterile fashion.  Local anesthesia: Topical Ethyl chloride.  With sterile technique and under real time ultrasound guidance:  Joint visualized.  21g 2 inch needle inserted posterior approach. Pictures taken for needle placement. Patient did have injection of 2 cc of 0.5% Marcaine, and 1cc of Kenalog 40 mg/dL. Completed without difficulty  Pain immediately resolved suggesting accurate placement of the medication.    Advised to call if fevers/chills, erythema, induration, drainage, or persistent bleeding.  Impression: Technically successful ultrasound guided injection.    Impression and Recommendations:     The above documentation has been reviewed and is accurate and complete Lyndal Pulley, DO       Note: This dictation was prepared with Dragon dictation along with smaller phrase technology. Any transcriptional errors that result from this process are unintentional.

## 2020-06-16 ENCOUNTER — Ambulatory Visit: Payer: Self-pay

## 2020-06-16 ENCOUNTER — Other Ambulatory Visit: Payer: Self-pay

## 2020-06-16 ENCOUNTER — Ambulatory Visit: Payer: Medicare HMO | Admitting: Family Medicine

## 2020-06-16 VITALS — BP 122/72 | HR 91 | Ht 70.0 in | Wt 247.0 lb

## 2020-06-16 DIAGNOSIS — M25511 Pain in right shoulder: Secondary | ICD-10-CM | POA: Diagnosis not present

## 2020-06-16 DIAGNOSIS — G8929 Other chronic pain: Secondary | ICD-10-CM

## 2020-06-16 DIAGNOSIS — M12812 Other specific arthropathies, not elsewhere classified, left shoulder: Secondary | ICD-10-CM

## 2020-06-16 DIAGNOSIS — M1712 Unilateral primary osteoarthritis, left knee: Secondary | ICD-10-CM

## 2020-06-16 DIAGNOSIS — M12811 Other specific arthropathies, not elsewhere classified, right shoulder: Secondary | ICD-10-CM | POA: Diagnosis not present

## 2020-06-16 DIAGNOSIS — M25512 Pain in left shoulder: Secondary | ICD-10-CM | POA: Diagnosis not present

## 2020-06-16 MED ORDER — PREDNISONE 20 MG PO TABS: 20.0000 mg | ORAL_TABLET | Freq: Every day | ORAL | 0 refills | Status: DC

## 2020-06-16 NOTE — Assessment & Plan Note (Signed)
Repeat injection given again today.  Once again secondary to social determinants of health this chronic problem with exacerbation he is not a candidate for surgery.  Has seen orthopedic surgery about this, continue bracing, steroid injections otherwise every 10 to 12 weeks.  Due to patient compensating starting to have bilateral hip pain likely more of a greater trochanteric bursitis and may need to consider the possibility for injections as well.

## 2020-06-16 NOTE — Assessment & Plan Note (Signed)
bilateral shoulder injections given June 16, 2020 ductions given today, tolerated the procedure well, discussed icing regimen and home exercises increase activity slowly.  Discussed icing regimen, increase activity slowly.  Patient knows that we can have this every 10 to 12 weeks.  Due to patient's other social determinants of health and comorbidities patient is not a candidate for surgery

## 2020-06-16 NOTE — Patient Instructions (Signed)
See at end of month for hip injections See me in another 6 weeks for shoulder and knee injections

## 2020-06-17 ENCOUNTER — Encounter: Payer: Self-pay | Admitting: Family Medicine

## 2020-06-21 DIAGNOSIS — L308 Other specified dermatitis: Secondary | ICD-10-CM | POA: Diagnosis not present

## 2020-06-21 DIAGNOSIS — L2089 Other atopic dermatitis: Secondary | ICD-10-CM | POA: Diagnosis not present

## 2020-06-22 NOTE — Telephone Encounter (Signed)
I have yet to receive the patient's assistance application back. I called and left him a follow up message.  Will be here to assist in the future as needed.  Charlann Boxer, CPhT

## 2020-06-27 ENCOUNTER — Ambulatory Visit (INDEPENDENT_AMBULATORY_CARE_PROVIDER_SITE_OTHER): Payer: Medicare HMO | Admitting: *Deleted

## 2020-06-27 DIAGNOSIS — I428 Other cardiomyopathies: Secondary | ICD-10-CM

## 2020-06-27 LAB — CUP PACEART REMOTE DEVICE CHECK
Battery Remaining Longevity: 51 mo
Battery Voltage: 2.97 V
Brady Statistic AP VP Percent: 0 %
Brady Statistic AP VS Percent: 0 %
Brady Statistic AS VP Percent: 0 %
Brady Statistic AS VS Percent: 0 %
Brady Statistic RA Percent Paced: 0 %
Brady Statistic RV Percent Paced: 99.86 %
Date Time Interrogation Session: 20210911151547
HighPow Impedance: 81 Ohm
Implantable Lead Implant Date: 20181205
Implantable Lead Implant Date: 20181205
Implantable Lead Location: 753858
Implantable Lead Location: 753860
Implantable Lead Model: 4398
Implantable Pulse Generator Implant Date: 20181205
Lead Channel Impedance Value: 1140 Ohm
Lead Channel Impedance Value: 1197 Ohm
Lead Channel Impedance Value: 1254 Ohm
Lead Channel Impedance Value: 1292 Ohm
Lead Channel Impedance Value: 250.943
Lead Channel Impedance Value: 302.899
Lead Channel Impedance Value: 307.746
Lead Channel Impedance Value: 325.111
Lead Channel Impedance Value: 330.703
Lead Channel Impedance Value: 361 Ohm
Lead Channel Impedance Value: 4047 Ohm
Lead Channel Impedance Value: 418 Ohm
Lead Channel Impedance Value: 475 Ohm
Lead Channel Impedance Value: 532 Ohm
Lead Channel Impedance Value: 836 Ohm
Lead Channel Impedance Value: 874 Ohm
Lead Channel Impedance Value: 931 Ohm
Lead Channel Impedance Value: 931 Ohm
Lead Channel Pacing Threshold Amplitude: 0.375 V
Lead Channel Pacing Threshold Pulse Width: 0.4 ms
Lead Channel Sensing Intrinsic Amplitude: 12.375 mV
Lead Channel Sensing Intrinsic Amplitude: 12.375 mV
Lead Channel Setting Pacing Amplitude: 2.25 V
Lead Channel Setting Pacing Amplitude: 2.5 V
Lead Channel Setting Pacing Pulse Width: 0.4 ms
Lead Channel Setting Pacing Pulse Width: 0.6 ms
Lead Channel Setting Sensing Sensitivity: 0.3 mV

## 2020-06-28 ENCOUNTER — Ambulatory Visit (INDEPENDENT_AMBULATORY_CARE_PROVIDER_SITE_OTHER): Payer: Medicare HMO

## 2020-06-28 DIAGNOSIS — I5022 Chronic systolic (congestive) heart failure: Secondary | ICD-10-CM

## 2020-06-28 DIAGNOSIS — Z95810 Presence of automatic (implantable) cardiac defibrillator: Secondary | ICD-10-CM

## 2020-06-28 NOTE — Progress Notes (Signed)
EPIC Encounter for ICM Monitoring  Patient Name: CAVION FAIOLA is a 64 y.o. male Date: 06/28/2020 Primary Care Physican: [name], MD Primary Cardiologist:[place] Electrophysiologist:[facility] Bi-V Pacing:99.9% 06/28/2020 Weight: 237 lbs  Spoke with patient and reports feeling well at this time but weight has been fluctuating.     Optivol thoracic impedancenormal.  Prescribed:  Torsemide 20 mg to 3 tablets (60 mg total)and 2 tablets (40 mg total) every evening.  Spironolactone 25 mg take 1 tablet daily.  Labs: 04/22/2020 Creatinine 1.15, BUN 34, Potassium 3.7, Sodium 139, GFR >60 03/11/2020 Creatinine 0.80, BUN 15, Potassium 4.8, Sodium 139, GFR >60 12/22/2019 Creatinine 1.08, BUN 24, Potassium 4.0, Sodium 139, GFR >60 A complete set of results can be found in Results Review.  Recommendations: Recommendation to limit salt intake to 2000 mg daily and fluid intake to 64 oz daily.  Encouraged to call if experiencing any fluid symptoms.   Follow-up plan: ICM clinic phone appointment on10/18/2021. 91 day device clinic remote transmission12/13/2021.   EP/Cardiology Office Visits:  Patient will call to reschedule with Advanced HF clinic.    Copy of ICM check sent to Dr. [name].   3 month ICM trend: 06/25/2020    1 Year ICM trend:       [name], RN 06/28/2020 11:34 AM

## 2020-06-29 ENCOUNTER — Telehealth: Payer: Self-pay | Admitting: Family Medicine

## 2020-06-29 NOTE — Telephone Encounter (Signed)
Pt thought he was going to be referred to a foot specialist for issues he is having with his R foot. Thi was discussed at 9/2 visit per pt.

## 2020-06-29 NOTE — Progress Notes (Signed)
Remote ICD transmission.   

## 2020-06-30 ENCOUNTER — Other Ambulatory Visit (HOSPITAL_COMMUNITY): Payer: Self-pay | Admitting: *Deleted

## 2020-06-30 ENCOUNTER — Other Ambulatory Visit: Payer: Self-pay

## 2020-06-30 DIAGNOSIS — M79671 Pain in right foot: Secondary | ICD-10-CM

## 2020-06-30 MED ORDER — CARVEDILOL 12.5 MG PO TABS
12.5000 mg | ORAL_TABLET | Freq: Two times a day (BID) | ORAL | 3 refills | Status: DC
Start: 2020-06-30 — End: 2021-05-16

## 2020-06-30 NOTE — Telephone Encounter (Signed)
Referral placed and patient notified.  

## 2020-07-04 ENCOUNTER — Other Ambulatory Visit: Payer: Self-pay

## 2020-07-04 ENCOUNTER — Ambulatory Visit (HOSPITAL_COMMUNITY)
Admission: RE | Admit: 2020-07-04 | Discharge: 2020-07-04 | Disposition: A | Discharge status: Home / Self Care | Payer: Medicare HMO | Source: Ambulatory Visit | Attending: Cardiology | Admitting: Cardiology

## 2020-07-04 VITALS — BP 125/85 | HR 71 | Wt 250.2 lb

## 2020-07-04 DIAGNOSIS — G4733 Obstructive sleep apnea (adult) (pediatric): Secondary | ICD-10-CM | POA: Insufficient documentation

## 2020-07-04 DIAGNOSIS — I110 Hypertensive heart disease with heart failure: Secondary | ICD-10-CM | POA: Diagnosis not present

## 2020-07-04 DIAGNOSIS — Z79899 Other long term (current) drug therapy: Secondary | ICD-10-CM | POA: Insufficient documentation

## 2020-07-04 DIAGNOSIS — Z7952 Long term (current) use of systemic steroids: Secondary | ICD-10-CM | POA: Insufficient documentation

## 2020-07-04 DIAGNOSIS — Z87891 Personal history of nicotine dependence: Secondary | ICD-10-CM | POA: Diagnosis not present

## 2020-07-04 DIAGNOSIS — Z7901 Long term (current) use of anticoagulants: Secondary | ICD-10-CM | POA: Diagnosis not present

## 2020-07-04 DIAGNOSIS — Z9981 Dependence on supplemental oxygen: Secondary | ICD-10-CM | POA: Insufficient documentation

## 2020-07-04 DIAGNOSIS — I4819 Other persistent atrial fibrillation: Secondary | ICD-10-CM

## 2020-07-04 DIAGNOSIS — M25569 Pain in unspecified knee: Secondary | ICD-10-CM | POA: Diagnosis not present

## 2020-07-04 DIAGNOSIS — Z6835 Body mass index (BMI) 35.0-35.9, adult: Secondary | ICD-10-CM | POA: Diagnosis not present

## 2020-07-04 DIAGNOSIS — K219 Gastro-esophageal reflux disease without esophagitis: Secondary | ICD-10-CM | POA: Diagnosis not present

## 2020-07-04 DIAGNOSIS — E785 Hyperlipidemia, unspecified: Secondary | ICD-10-CM | POA: Diagnosis not present

## 2020-07-04 DIAGNOSIS — I428 Other cardiomyopathies: Secondary | ICD-10-CM | POA: Diagnosis not present

## 2020-07-04 DIAGNOSIS — I442 Atrioventricular block, complete: Secondary | ICD-10-CM | POA: Insufficient documentation

## 2020-07-04 DIAGNOSIS — J449 Chronic obstructive pulmonary disease, unspecified: Secondary | ICD-10-CM | POA: Insufficient documentation

## 2020-07-04 DIAGNOSIS — I5030 Unspecified diastolic (congestive) heart failure: Secondary | ICD-10-CM

## 2020-07-04 DIAGNOSIS — E7849 Other hyperlipidemia: Secondary | ICD-10-CM | POA: Diagnosis not present

## 2020-07-04 DIAGNOSIS — L308 Other specified dermatitis: Secondary | ICD-10-CM | POA: Insufficient documentation

## 2020-07-04 DIAGNOSIS — I5022 Chronic systolic (congestive) heart failure: Secondary | ICD-10-CM | POA: Diagnosis not present

## 2020-07-04 DIAGNOSIS — Z95810 Presence of automatic (implantable) cardiac defibrillator: Secondary | ICD-10-CM | POA: Diagnosis not present

## 2020-07-04 DIAGNOSIS — I2510 Atherosclerotic heart disease of native coronary artery without angina pectoris: Secondary | ICD-10-CM | POA: Diagnosis not present

## 2020-07-04 DIAGNOSIS — Z888 Allergy status to other drugs, medicaments and biological substances status: Secondary | ICD-10-CM | POA: Insufficient documentation

## 2020-07-04 DIAGNOSIS — Z8616 Personal history of COVID-19: Secondary | ICD-10-CM | POA: Diagnosis not present

## 2020-07-04 DIAGNOSIS — E119 Type 2 diabetes mellitus without complications: Secondary | ICD-10-CM | POA: Diagnosis not present

## 2020-07-04 DIAGNOSIS — I4821 Permanent atrial fibrillation: Secondary | ICD-10-CM | POA: Diagnosis not present

## 2020-07-04 DIAGNOSIS — Z91120 Patient's intentional underdosing of medication regimen due to financial hardship: Secondary | ICD-10-CM | POA: Diagnosis not present

## 2020-07-04 DIAGNOSIS — N400 Enlarged prostate without lower urinary tract symptoms: Secondary | ICD-10-CM | POA: Insufficient documentation

## 2020-07-04 LAB — PROTIME-INR
INR: 1 (ref 0.8–1.2)
Prothrombin Time: 12.9 seconds (ref 11.4–15.2)

## 2020-07-04 LAB — DIGOXIN LEVEL: Digoxin Level: 0.7 ng/mL — ABNORMAL LOW (ref 1.0–2.0)

## 2020-07-04 MED ORDER — APIXABAN 5 MG PO TABS: 5.0000 mg | ORAL_TABLET | Freq: Two times a day (BID) | ORAL | 3 refills | Status: DC

## 2020-07-04 MED ORDER — WARFARIN SODIUM 5 MG PO TABS: 5.0000 mg | ORAL_TABLET | Freq: Every day | ORAL | 0 refills | Status: DC

## 2020-07-04 MED ORDER — ENTRESTO 24-26 MG PO TABS: 1.0000 | ORAL_TABLET | Freq: Two times a day (BID) | ORAL | 3 refills | Status: DC

## 2020-07-04 NOTE — Patient Instructions (Signed)
START Coumadin 5mg  Daily until you follow up with Dr. Gwynn Burly office. They will contact you about scheduling an appointment  START Entresto 24/26mg  twice daily  You have been referred to [facility] Clinic. They will contact you about scheduling an appointment  Labs done today, your results will be available in MyChart, we will contact you for abnormal readings.  Your physician recommends that you return for:   Labs in 10-14 days    Follow up appointment in 3 months   If you have any questions or concerns before your next appointment please send [place] a message through [place] or call our office at [phone].    TO LEAVE A MESSAGE FOR THE NURSE SELECT OPTION 2, PLEASE LEAVE A MESSAGE INCLUDING: . YOUR NAME . DATE OF BIRTH . CALL BACK NUMBER . REASON FOR CALL**this is important as we prioritize the call backs  [place] AS LONG AS YOU CALL BEFORE 4:00 PM  At the [facility], you and your health needs are our priority. As part of our continuing mission to provide you with exceptional heart care, we have created designated Provider Care Teams. These Care Teams include your primary Cardiologist (physician) and Advanced Practice Providers (APPs- Physician Assistants and Nurse Practitioners) who all work together to provide you with the care you need, when you need it.   You may see any of the following providers on your designated Care Team at your next follow up: [name] Dr Glori Bickers . Dr Loralie Champagne . Darrick Grinder, NP . Lyda Jester, PA . Audry Riles, PharmD   Please be sure to bring in all your medications bottles to every appointment.

## 2020-07-05 ENCOUNTER — Telehealth (HOSPITAL_COMMUNITY): Payer: Self-pay | Admitting: Pharmacy Technician

## 2020-07-05 NOTE — Telephone Encounter (Addendum)
Patient is now taking Entresto (30 day co-pay, $37.54) and an application for [employer] has been started. Will fax application once signatures are obtained.  Will follow up.

## 2020-07-05 NOTE — Telephone Encounter (Signed)
Patient asked if this referral could be re-entered so that he can be scheduled.

## 2020-07-05 NOTE — Progress Notes (Signed)
ID:  [name], DOB [birth year], MRN [id number]  Provider location: [facility] Type of Visit: Established patient  PCP:  [name], MD  Cardiologist:  Dr. [name]   History of Present Illness: [name] is a 64 y.o. male who has history of COPD on home oxygen, permanent atrial fibrillation, and chronic systolic CHF.  Amiodarone and DCCV were tried for atrial fibrillation in the past without success.   Patient has been noted to have a low EF since 2013.  EF improved by to normal by 2015 but dropped to 30-35% by 11/16.  Cath in 6/17 showed some CAD but not enough to cause cardiomyopathy.  He was admitted in 3/18 with PNA, atrial fibrillation/RVR, and acute on chronic systolic CHF.  Echo in 3/18 showed fall in EF to 10-15%.   I took him for right and left heart cath in 4/18, which showed nonobstructive CAD and relatively optimized filling pressures.    Syncope in 11/18. Loop recorder showed 6 second pause and periods of complete heart block. He had Medtronic BiV ICD placed in 12/18.  In 1/19, he had AV nodal ablation to promote BiV pacing.   Echo was done 6/19 with EF 25-30% with moderate RV dilation/mildly decreased systolic function. CPX 7/19 was submaximal, probably mild functional impairment.    Echo in 1/21 showed EF 35-40% with diffuse hypokinesis, moderate RV dilation with mildly decreased systolic function.   He was admitted with COVID-19 PNA in 2/19.  He had AKI/hypotension and Entresto was stopped.   After last appointment, he was supposed to start apixaban, Entresto, and Crestor.  He did not start any of these. He says that apixaban is too expensive and he had myalgias with Crestor.  No particular reason for not starting Entresto.   Patient returns for followup of CHF.   No dyspnea walking on flat ground.  Limited by knee pain.  No chest pain.  He has slept in a recliner long-term.  No lightheadedness.  Skin has cleared up on prednisone, now down to 5  mg daily. Weight is up about 7 lbs.   Medtronic device interrogation: >99% BiV pacing, stable thoracic impedance.    Labs (4/18): pro-BNP 904, K 3.1, creatinine 1.39 => 1.08 with BUN 66 => 37, hgb 14.1.  Labs (5/18): hemoglobin 13.1 Labs (6/18): K 4, creatinine 1.3, BNP 294 Labs (7/18): K 3.8, creatinine 1.23, LDL 96, HDL 42 Labs (8/18): K 4.5, creatinine 1.22 Labs (10/18): digoxin 0.4 Labs (12/18): LDL 115, HDL 37, TGs 214, K 4.2, creatinine 0.84 Labs (1/19): K 4.4, creatinine 0.75, pro-BNP 433 Labs (2/19): digoxin 0.4 Labs (3/19): LDL 121, hgb 12.8, K 4.5, creatinine 0.98 Labs (6/19): LDL 48, HDL 45, K 5, creatinine 0.94, digoxin 0.3 Labs (9/19): K 3.7, creatinine 1.09 => 1.32, digoxin 0.2 Labs (11/19): LDL 115 Labs (12/20): K 3.9, creatinine 0.87, LDL 94  Labs (3/21): K 4, creatinine 1.08 Labs (6/21): LDL 109 Labs (7/21): K 3.7, creatinine 1.15  PMH: 1. Asthma 2. COPD: On home oxygen. Prior smoker.  3. Atrial fibrillation: Permanent.  He failed DCCV and amiodarone in the past . 4. HTN 5. GERD 6. Hyperlipidemia: Myalgias with atorvastatin 7. BPH.  8. Chronic systolic CHF: Nonischemic cardiomyopathy.  - EF 25% by TEE in 7/13.  Normalized on 2015 echo. - Echo (11/16): EF 30-35%.  - LHC/RHC (6/17): 80% ostial stenosis small D1, 30-40% pLAD.  Mean RA 4, PA 26/10, mean PCWP 11, CI 2.33.  - Echo (  3/18): EF 10-15%, diffuse hypokinesis, severe LV dilation, moderate RV dilation with severely decreased systolic function.  - LHC/RHC (4/18): 40% proximal LAD.  Mean RA 8, PA 37/10 mean 22, mean PCWP 22, LVEDP 9, CI 2.39. - Medtronic BiV ICD placement in 12/18 with AV nodal ablation to promote BiV pacing in 1/19.  - Echo (6/19): EF 25-30% with mild LV dilation, moderately dilated RV with mildly decreased systolic function, IVC not dilated, PASP 23 mmHg.  - CPX (7/19): peak VO2 17.6 (78% predicted), VE/VCO2 slope 31, RER 1.02 => submaximal, probably mild functional impairment.  - Echo  (1/21): EF 35-40% with diffuse hypokinesis, moderate RV dilation with mildly decreased systolic function. 9. Morbid obesity 10. ILR in place.  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 Complete heart block: s/p Medtronic CRT-D.  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 OSA: Untreated, unable to tolerate CPAP.  15. COVID-19 PNA 2/21.   Social History   Socioeconomic History  . Marital status: Divorced    Spouse name: Not on file  . Number of children: 2  . Years of education: Not on file  . Highest education level: Not on file  Occupational History  . Occupation: retired/disabled. prev worked in Therapist, sports.    Employer: DISABLED  Tobacco Use  . Smoking status: Former Smoker    Packs/day: 2.00    Years: 30.00    Pack years: 60.00    Types: Cigarettes    Quit date: 10/16/2007    Years since quitting: 12.7  . Smokeless tobacco: Never Used  Vaping Use  . Vaping Use: Never used  Substance and Sexual Activity  . Alcohol use: No  . Drug use: No  . Sexual activity: Not Currently  Other Topics Concern  . Not on file  Social History Narrative   Lives alone.   Social Determinants of Health   Financial Resource Strain:   . Difficulty of Paying Living Expenses: Not on file  Food Insecurity:   . Worried About Charity fundraiser in the Last Year: Not on file  . Ran Out of Food in the Last Year: Not on file  Transportation Needs:   . Lack of Transportation (Medical): Not on file  . Lack of Transportation (Non-Medical): Not on file  Physical Activity:   . Days of Exercise per Week: Not on file  . Minutes of Exercise per Session: Not on file  Stress:   . Feeling of Stress : Not on file  Social Connections:   . Frequency of Communication with Friends and Family: Not on file  . Frequency of Social Gatherings with Friends and Family: Not on file  . Attends Religious Services: Not on file  . Active Member of Clubs or Organizations: Not on file  . Attends Archivist Meetings:  Not on file  . Marital Status: Not on file  Intimate Partner Violence:   . Fear of Current or Ex-Partner: Not on file  . Emotionally Abused: Not on file  . Physically Abused: Not on file  . Sexually Abused: Not on file   Family History  Problem Relation Age of Onset  . COPD Mother   . Asthma Mother   . Colon polyps Mother   . Allergies Mother   . Hypothyroidism Mother   . Asthma Maternal Grandmother   . Colon cancer Neg Hx    ROS: All systems reviewed and negative except as per HPI.   Current Outpatient Medications  Medication Sig Dispense Refill  . acetaminophen (TYLENOL) 500 MG tablet  Take 500 mg by mouth every 6 (six) hours as needed.    [name] albuterol (VENTOLIN HFA) 108 (90 Base) MCG/ACT inhaler INHALE 2 PUFFS 4 TIMES A DAY as needed 54 g 3  . ALPRAZolam (XANAX) 0.5 MG tablet TAKE 2 TABLETS BY MOUTH AT BEDTIME 60 tablet 2  . carisoprodol (SOMA) 350 MG tablet TAKE 1 TABLET BY MOUTH THREE TIMES A DAY AS NEEDED FOR MUSCLE SPASMS 90 tablet 5  . carvedilol (COREG) 12.5 MG tablet Take 1 tablet (12.5 mg total) by mouth 2 (two) times daily with a meal. 180 tablet 3  . digoxin (LANOXIN) 0.25 MG tablet TAKE 1/2 TABLET BY MOUTH EVERY DAY 45 tablet 3  . doxepin (SINEQUAN) 10 MG capsule TAKE 2 CAPSULES BY MOUTH AT BEDTIME. ANNUAL APPT DUE IN JUNE MUST SEE PROVIDER FOR FUTURE REFILLS 180 capsule 0  . fluticasone (FLONASE) 50 MCG/ACT nasal spray PLACE 2 SPRAYS INTO BOTH NOSTRILS DAILY AS NEEDED FOR ALLERGIES OR RHINITIS. 48 mL 1  . hydrocortisone 2.5 % cream APPLY TO AFFECTED AREA TWICE A DAY 28.35 g 6  . hydrOXYzine (ATARAX/VISTARIL) 10 MG tablet TAKE 1 TABLET (10 MG TOTAL) BY MOUTH 3 (THREE) TIMES DAILY AS NEEDED FOR ITCHING. 60 tablet 2  . lidocaine (LIDODERM) 5 % APPLY 1 PATCH TO AFFECTED AREA FOR UP TO 12 HOURS . DO NOT USE MORE THAN ONE PATCH IN 24 HOURS. 90 patch 2  . methotrexate (RHEUMATREX) 2.5 MG tablet TAKE 4 TABLETS BY MOUTH EVERY WEEK 48 tablet 1  . omeprazole (PRILOSEC) 20 MG  capsule TAKE 1 CAPSULE BY MOUTH TWICE A DAY 180 capsule 1  . predniSONE (DELTASONE) 20 MG tablet Take 1 tablet (20 mg total) by mouth daily with breakfast. 10 tablet 0  . silver sulfADIAZINE (SILVADENE) 1 % cream APPLY TOPICALLY TO AFFECTED AREA EVERY DAY 50 g 3  . spironolactone (ALDACTONE) 25 MG tablet TAKE 1 TABLET BY MOUTH EVERY DAY 90 tablet 3  . tamsulosin (FLOMAX) 0.4 MG CAPS capsule     . torsemide (DEMADEX) 20 MG tablet Take 3 tablets (60 mg total) every morning and 2 tablets (40 mg total) every evening. 450 tablet 1  . traMADol (ULTRAM) 50 MG tablet Take 1 tablet (50 mg total) by mouth daily as needed. 30 tablet 5  . traZODone (DESYREL) 100 MG tablet TAKE 1/2 TABLET BY MOUTH EVERY DAY AT BEDTIME AS NEEDED 45 tablet 1  . triamcinolone (KENALOG) 0.1 % paste APPLY 1 APPLICATION IN THE MOUTH OR THROAT 2 (TWO) TIMES DAILY AS DIRECTED 5 g 2  . TUMS 500 MG chewable tablet Chew 500-2,000 mg by mouth every 4 (four) hours as needed for indigestion or heartburn.     . valACYclovir (VALTREX) 1000 MG tablet TAKE 1 TABLET BY MOUTH THREE TIMES A DAY 21 tablet 2  . venlafaxine XR (EFFEXOR-XR) 37.5 MG 24 hr capsule Take 1 capsule (37.5 mg total) by mouth daily with breakfast. 90 capsule 1  . zolpidem (AMBIEN) 10 MG tablet TAKE 1 TABLET BY MOUTH EVERYDAY AT BEDTIME 90 tablet 1  . sacubitril-valsartan (ENTRESTO) 24-26 MG Take 1 tablet by mouth 2 (two) times daily. 60 tablet 3  . warfarin (COUMADIN) 5 MG tablet Take 1 tablet (5 mg total) by mouth daily. 30 tablet 0   No current facility-administered medications for this encounter.   BP 125/85   Pulse 71   Wt 113.5 kg (250 lb 3.2 oz)   SpO2 97%   BMI 35.90 kg/m  General: NAD Neck:  No JVD, no thyromegaly or thyroid nodule.  Lungs: Clear to auscultation bilaterally with normal respiratory effort. CV: Nondisplaced PMI.  Heart regular S1/S2, no S3/S4, no murmur.  No peripheral edema.  No carotid bruit.  Normal pedal pulses.  Abdomen: Soft, nontender, no  hepatosplenomegaly, no distention.  Skin: Intact without lesions or rashes.  Neurologic: Alert and oriented x 3.  Psych: Normal affect. Extremities: No clubbing or cyanosis.  HEENT: Normal.   Assessment/Plan:  1. Atrial fibrillation: Permanent.  Now s/p AV nodal ablation to allow effective BiV pacing.  - He needs to restart anticoagulation.  Apixaban and Xarelto would be too expensive, he is willing to go back on warfarin.  He wants to have this followed at Dr Gwynn Burly office.  2. COPD: No longer smoking.  Not using oxygen.  3. Chronic systolic CHF: Nonischemic cardiomyopathy based on 6/17 cath. However, he had a recent significant fall in EF from 30-35% to 10-15% on echo from 3/18.  LHC/RHC in 4/18 showed nonobstructive CAD and relatively optimized filling pressures.  He now has Medtronic CRT-D device with AV nodal ablation to allow BiV pacing.  Echo 6/19 with EF 25-30% with mildly decreased RV systolic function. CPX 7/19 was submaximal but likely only mild functional limitation from CHF.  Echo in 1/21 showed EF 35-40%.  NYHA class II-III symptoms.  Though weight is up, he is not volume overloaded by exam or Optivol.    - Continue Coreg 12.5 mg bid.   - Continue spironolactone 25 mg daily.        - Restart Entresto 24/26 bid, BMET today and in 10 days.  - Continue torsemide 40 mg bid.  - Continue digoxin 0.125 daily, check level.  4. CAD: Nonobstructive on 4/18 cath.  No exertional chest pain.  - He will be on warfarin so no ASA.  - Statin myalgias, will refer to lipid clinic to start [place].   5. Complete heart block: Now has Medtronic CRT-D device and s/p AV nodal ablation.  6. Rash: Spongiotic dermatitis.  Improved on prednisone.  7. OSA: Unable to tolerate CPAP.    Followup in 3 months  Signed, Loralie Champagne, MD  07/05/2020  [facility] [address] [facility]  [address] [phone] (office) [phone] (fax)

## 2020-07-05 NOTE — Telephone Encounter (Signed)
Appears that patient needs referral sent from PCP per his insurance for him to be seen by podiatry. Dr. Tamala Julian submitted referral but request for PCP referral as well.   Thank you!

## 2020-07-06 ENCOUNTER — Telehealth (HOSPITAL_COMMUNITY): Payer: Self-pay | Admitting: Licensed Clinical Social Worker

## 2020-07-06 NOTE — Telephone Encounter (Signed)
Ok this is done 

## 2020-07-06 NOTE — Telephone Encounter (Signed)
Sent in application via fax.  Will follow up.  

## 2020-07-06 NOTE — Telephone Encounter (Signed)
CSW informed by pharmacy patient advocate that pt is currently very overwhelmed with his health and his medication management- CSW discussed with MD who is agreeable to pt being referred to [facility] for assistance.  CSW called pt to discuss [facility] referral.  Pt states he was previously on paramedicine and it was helpful but he does not think he needs to be re-enrolled at this time.  States he has a good grasp on his medications his main concerns are cost of medications and side effects of medication.  CSW encouraged pt to reach out if he has further concerns with medication side effects and to make sure he speaks to a staff member before stopping any medications on his own.  Also encouraged pt to keep paramedicine program in mind if he starts to be overwhelmed by medication management.  Patient states he has good support system through his church and feels like he has access to everything he needs other than some of the more expensive medications that he is on (still very expensive despite pt reporting he has Extra Help).  No further needs at this time- CSW will continue to follow through clinic and assist as needed   Jorge Ny, [place] Worker [facility] Desk#: [phone] Cell#: [phone]

## 2020-07-06 NOTE — Telephone Encounter (Signed)
[employer] is using new 30 day free cards, with new billing information. The free card the patient received is not the correct one.  Pharmacy Billing Information  BIN: [id number]  PCN: [id number]  Group: [id number]  ID: [id number]  Will use the 30 day free card if the application is not approved by the time the patient runs out of Entresto.  Will follow up.

## 2020-07-11 ENCOUNTER — Telehealth: Payer: Self-pay | Admitting: General Practice

## 2020-07-11 NOTE — Telephone Encounter (Signed)
LMOVM for pt to call Villa Herb, RN @ [phone] to schedule INR appt. For week of 9/27.

## 2020-07-12 ENCOUNTER — Ambulatory Visit: Payer: Medicare HMO | Admitting: Family Medicine

## 2020-07-12 ENCOUNTER — Other Ambulatory Visit: Payer: Self-pay

## 2020-07-12 ENCOUNTER — Ambulatory Visit: Payer: Medicare HMO

## 2020-07-12 ENCOUNTER — Encounter: Payer: Self-pay | Admitting: Family Medicine

## 2020-07-12 VITALS — BP 116/88 | HR 79 | Ht 70.0 in | Wt 253.0 lb

## 2020-07-12 DIAGNOSIS — M545 Low back pain, unspecified: Secondary | ICD-10-CM

## 2020-07-12 DIAGNOSIS — M47817 Spondylosis without myelopathy or radiculopathy, lumbosacral region: Secondary | ICD-10-CM

## 2020-07-12 MED ORDER — PREDNISONE 20 MG PO TABS
20.0000 mg | ORAL_TABLET | Freq: Every day | ORAL | 0 refills | Status: DC
Start: 2020-07-12 — End: 2020-08-10

## 2020-07-12 NOTE — Patient Instructions (Addendum)
Xray today Great to see you as always Attempted SI joint injection today  Ice 20 minutes 2 times daily. Usually after activity and before bed. Exercises 3 times a week.  If not a lot better take the prednisone in a couple days See me again in 4-5 weeks

## 2020-07-12 NOTE — Progress Notes (Signed)
[place] Phone: [phone] Subjective:   [name], am serving as a scribe for Dr. Hulan Saas. This visit occurred during the SARS-CoV-2 public health emergency.  Safety protocols were in place, including screening questions prior to the visit, additional usage of staff PPE, and extensive cleaning of exam room while observing appropriate contact time as indicated for disinfecting solutions.   I'm seeing this patient by the request  of:  [name], MD  CC: Low back pain  BSW:[id number]   06/16/2020 Repeat injection given again today.  Once again secondary to social determinants of health this chronic problem with exacerbation he is not a candidate for surgery.  Has seen orthopedic surgery about this, continue bracing, steroid injections otherwise every 10 to 12 weeks.  Due to patient compensating starting to have bilateral hip pain likely more of a greater trochanteric bursitis and may need to consider the possibility for injections as well.   bilateral shoulder injections given June 16, 2020 ductions given today, tolerated the procedure well, discussed icing regimen and home exercises increase activity slowly.  Discussed icing regimen, increase activity slowly.  Patient knows that we can have this every 10 to 12 weeks.  Due to patient's other social determinants of health and comorbidities patient is not a candidate for surgery   Update 07/12/2020 [name] is a 64 y.o. male coming in with complaint of left knee and bilateral shoulder pain. Patient states that he has had pain for one week in left side of lumbar spine and left hip. Pain is constant. Using NSAIDs prn.  Patient points to the pain more over the lower back on the left side.  Patient states that it is fairly severe.  Patient states that any type of movement causes more pain.  Patient did have an MRI from 2015 that was independently visualized  by me MRI did show that patient had moderate facet arthropathy at multiple levels and L5-S1 right lateral disc osteophyte complex causing a right-sided L5 nerve root impingement.       Past Medical History:  Diagnosis Date  . AICD (automatic cardioverter/defibrillator) present   . Anemia    supposed to be taking Vit B but doesn't  . ANXIETY    takes Xanax nightly  . Arthritis   . Asthma    Albuterol prn and Advair daily;also takes Prednisone daily  . Atrial fibrillation ([place]) 09/06/2015  . Cardiomyopathy [facility])    a. EF 25% TEE July 2013; b. EF normalized 2015;  c. 03/2015 Echo: EF 40-45%, difrf HK, PASP 38 mmHg, Mild MR, sev LAE/RAE.  [name] Chronic constipation    takes OTC stool softener  . COPD (chronic obstructive pulmonary disease) ([place])    "one dr says COPD; one dr says emphysema" (09/18/2017)  . DEPRESSION    takes Zoloft and Doxepin daily  . Diverticulitis   . DYSKINESIA, ESOPHAGUS   . Emphysema of lung [facility])    "one dr says COPD; one dr says emphysema" (09/18/2017)  . Essential hypertension       . FIBROMYALGIA   . GERD (gastroesophageal reflux disease)       . Glaucoma   . HYPERLIPIDEMIA    a. Intolerant to statins.  . INSOMNIA    takes Ambien nightly  . Myocardial infarction [facility])    a. 2012 Myoview notable for prior infarct;  b. 03/2015 Lexiscan CL: EF 37%, diff HK, small area of inferior infarct  from apex to base-->Med Rx.  . Myocardial infarction ([place])   . O2 dependent    "2.5L q hs & prn" (09/18/2017)  . Paroxysmal atrial fibrillation ([facility])    a. CHA2DS2VASc = 3--> takes Coumadin;  b. 03/15/2015 Successful TEE/DCCV;  c. 03/2015 recurrent afib, Amio d/c'd in setting of hyperthyroidism.  . Peripheral neuropathy   . Pneumonia 12/2016  . Rash and other nonspecific skin eruption 04/12/2009   [name] cause found saw dermatologists x 2 and allergist  . SLEEP APNEA, OBSTRUCTIVE    a. doesn't use CPAP  . Syncope    a. 03/2015 s/p MDT LINQ.  [name] Type II diabetes mellitus ([place])          Past Surgical History:  Procedure Laterality Date  . ACNE CYST REMOVAL     2 on back   . AV NODE ABLATION N/A 10/25/2017   Procedure: AV NODE ABLATION;  Surgeon: Deboraha Sprang, MD;  Location: [place] CV LAB;  Service: Cardiovascular;  Laterality: N/A;  . BIV ICD INSERTION CRT-D N/A 09/18/2017   Procedure: BIV ICD INSERTION CRT-D;  Surgeon: Deboraha Sprang, MD;  Location: [place] CV LAB;  Service: Cardiovascular;  Laterality: N/A;  . CARDIAC CATHETERIZATION N/A 03/21/2016   Procedure: Right/Left Heart Cath and Coronary Angiography;  Surgeon: Larey Dresser, MD;  Location: [place] CV LAB;  Service: Cardiovascular;  Laterality: N/A;  . CARDIOVERSION  04/18/2012   Procedure: CARDIOVERSION;  Surgeon: Fay Records, MD;  Location: [place];  Service: Cardiovascular;  Laterality: N/A;  . CARDIOVERSION  04/25/2012   Procedure: CARDIOVERSION;  Surgeon: Thayer Headings, MD;  Location: [place];  Service: Cardiovascular;  Laterality: N/A;  . CARDIOVERSION  04/25/2012   Procedure: CARDIOVERSION;  Surgeon: Fay Records, MD;  Location: [place];  Service: Cardiovascular;  Laterality: N/A;  . CARDIOVERSION  05/09/2012   Procedure: CARDIOVERSION;  Surgeon: Sherren Mocha, MD;  Location: [place];  Service: Cardiovascular;  Laterality: N/A;  changed from crenshaw to cooper by trish/leone-endo  . CARDIOVERSION N/A 03/15/2015   Procedure: CARDIOVERSION;  Surgeon: Thayer Headings, MD;  Location: [facility]COPY;  Service: Cardiovascular;  Laterality: N/A;  . COLONOSCOPY    . COLONOSCOPY WITH PROPOFOL N/A 10/21/2014   Procedure: COLONOSCOPY WITH PROPOFOL;  Surgeon: Ladene Artist, MD;  Location: [facility] ENDOSCOPY;  Service: Endoscopy;  Laterality: N/A;  . EP IMPLANTABLE DEVICE N/A 04/06/2015   Procedure: Loop Recorder Insertion;  Surgeon: Evans Lance, MD;  Location: [place] CV LAB;  Service: Cardiovascular;  Laterality: N/A;  . ESOPHAGOGASTRODUODENOSCOPY    . JOINT REPLACEMENT    . LOOP RECORDER REMOVAL  N/A 09/18/2017   Procedure: LOOP RECORDER REMOVAL;  Surgeon: Deboraha Sprang, MD;  Location: [place] CV LAB;  Service: Cardiovascular;  Laterality: N/A;  . RIGHT/LEFT HEART CATH AND CORONARY ANGIOGRAPHY N/A 01/28/2017   Procedure: Right/Left Heart Cath and Coronary Angiography;  Surgeon: Larey Dresser, MD;  Location: [place] CV LAB;  Service: Cardiovascular;  Laterality: N/A;  . TEE WITHOUT CARDIOVERSION  04/25/2012   Procedure: TRANSESOPHAGEAL ECHOCARDIOGRAM (TEE);  Surgeon: Thayer Headings, MD;  Location: [place];  Service: Cardiovascular;  Laterality: N/A;  . TEE WITHOUT CARDIOVERSION N/A 03/15/2015   Procedure: TRANSESOPHAGEAL ECHOCARDIOGRAM (TEE);  Surgeon: Thayer Headings, MD;  Location: [place];  Service: Cardiovascular;  Laterality: N/A;  . TONSILLECTOMY AND ADENOIDECTOMY    . TOTAL KNEE ARTHROPLASTY Right 06/15/2014   Procedure: TOTAL KNEE ARTHROPLASTY;  Surgeon: Renette Butters,  MD;  Location: [place];  Service: Orthopedics;  Laterality: Right;   Social History   Socioeconomic History  . Marital status: Divorced    Spouse name: Not on file  . Number of children: 2  . Years of education: Not on file  . Highest education level: Not on file  Occupational History  . Occupation: retired/disabled. prev worked in Therapist, sports.    Employer: DISABLED  Tobacco Use  . Smoking status: Former Smoker    Packs/day: 2.00    Years: 30.00    Pack years: 60.00    Types: Cigarettes    Quit date: 10/16/2007    Years since quitting: 12.7  . Smokeless tobacco: Never Used  Vaping Use  . Vaping Use: Never used  Substance and Sexual Activity  . Alcohol use: [name]  . Drug use: [name]  . Sexual activity: Not Currently  Other Topics Concern  . Not on file  Social History Narrative   Lives alone.   Social Determinants of Health   Financial Resource Strain:   . Difficulty of Paying Living Expenses: Not on file  Food Insecurity:   . Worried About Charity fundraiser in the Last  Year: Not on file  . Ran Out of Food in the Last Year: Not on file  Transportation Needs:   . Lack of Transportation (Medical): Not on file  . Lack of Transportation (Non-Medical): Not on file  Physical Activity:   . Days of Exercise per Week: Not on file  . Minutes of Exercise per Session: Not on file  Stress:   . Feeling of Stress : Not on file  Social Connections:   . Frequency of Communication with Friends and Family: Not on file  . Frequency of Social Gatherings with Friends and Family: Not on file  . Attends Religious Services: Not on file  . Active Member of Clubs or Organizations: Not on file  . Attends Archivist Meetings: Not on file  . Marital Status: Not on file   Allergies  Allergen Reactions  . Amiodarone Other (See Comments)    hyperthyroidism  . Statins Other (See Comments)    myalgia  . Tape Other (See Comments)    Skin Tears Use Paper Tape Only   Family History  Problem Relation Age of Onset  . COPD Mother   . Asthma Mother   . Colon polyps Mother   . Allergies Mother   . Hypothyroidism Mother   . Asthma Maternal Grandmother   . Colon cancer Neg Hx     Current Outpatient Medications (Endocrine & Metabolic):  .  predniSONE (DELTASONE) 20 MG tablet, Take 1 tablet (20 mg total) by mouth daily with breakfast.  Current Outpatient Medications (Cardiovascular):  .  carvedilol (COREG) 12.5 MG tablet, Take 1 tablet (12.5 mg total) by mouth 2 (two) times daily with a meal. .  digoxin (LANOXIN) 0.25 MG tablet, TAKE 1/2 TABLET BY MOUTH EVERY DAY .  sacubitril-valsartan (ENTRESTO) 24-26 MG, Take 1 tablet by mouth 2 (two) times daily. [name]  spironolactone (ALDACTONE) 25 MG tablet, TAKE 1 TABLET BY MOUTH EVERY DAY .  torsemide (DEMADEX) 20 MG tablet, Take 3 tablets (60 mg total) every morning and 2 tablets (40 mg total) every evening.  Current Outpatient Medications (Respiratory):  .  albuterol (VENTOLIN HFA) 108 (90 Base) MCG/ACT inhaler, INHALE 2 PUFFS 4  TIMES A DAY as needed .  fluticasone (FLONASE) 50 MCG/ACT nasal spray, PLACE 2 SPRAYS INTO BOTH NOSTRILS DAILY AS NEEDED  FOR ALLERGIES OR RHINITIS.  Current Outpatient Medications (Analgesics):  .  acetaminophen (TYLENOL) 500 MG tablet, Take 500 mg by mouth every 6 (six) hours as needed. .  traMADol (ULTRAM) 50 MG tablet, Take 1 tablet (50 mg total) by mouth daily as needed.  Current Outpatient Medications (Hematological):  .  warfarin (COUMADIN) 5 MG tablet, Take 1 tablet (5 mg total) by mouth daily.  Current Outpatient Medications (Other):  [name]  ALPRAZolam (XANAX) 0.5 MG tablet, TAKE 2 TABLETS BY MOUTH AT BEDTIME .  carisoprodol (SOMA) 350 MG tablet, TAKE 1 TABLET BY MOUTH THREE TIMES A DAY AS NEEDED FOR MUSCLE SPASMS .  doxepin (SINEQUAN) 10 MG capsule, TAKE 2 CAPSULES BY MOUTH AT BEDTIME. ANNUAL APPT DUE IN JUNE MUST SEE PROVIDER FOR FUTURE REFILLS .  hydrocortisone 2.5 % cream, APPLY TO AFFECTED AREA TWICE A DAY .  hydrOXYzine (ATARAX/VISTARIL) 10 MG tablet, TAKE 1 TABLET (10 MG TOTAL) BY MOUTH 3 (THREE) TIMES DAILY AS NEEDED FOR ITCHING. .  lidocaine (LIDODERM) 5 %, APPLY 1 PATCH TO AFFECTED AREA FOR UP TO 12 HOURS . DO NOT USE MORE THAN ONE PATCH IN 24 HOURS. .  methotrexate (RHEUMATREX) 2.5 MG tablet, TAKE 4 TABLETS BY MOUTH EVERY WEEK .  omeprazole (PRILOSEC) 20 MG capsule, TAKE 1 CAPSULE BY MOUTH TWICE A DAY .  silver sulfADIAZINE (SILVADENE) 1 % cream, APPLY TOPICALLY TO AFFECTED AREA EVERY DAY .  tamsulosin (FLOMAX) 0.4 MG CAPS capsule,  .  traZODone (DESYREL) 100 MG tablet, TAKE 1/2 TABLET BY MOUTH EVERY DAY AT BEDTIME AS NEEDED .  triamcinolone (KENALOG) 0.1 % paste, APPLY 1 APPLICATION IN THE MOUTH OR THROAT 2 (TWO) TIMES DAILY AS DIRECTED .  TUMS 500 MG chewable tablet, Chew 500-2,000 mg by mouth every 4 (four) hours as needed for indigestion or heartburn.  .  valACYclovir (VALTREX) 1000 MG tablet, TAKE 1 TABLET BY MOUTH THREE TIMES A DAY .  venlafaxine XR (EFFEXOR-XR) 37.5 MG  24 hr capsule, Take 1 capsule (37.5 mg total) by mouth daily with breakfast. .  zolpidem (AMBIEN) 10 MG tablet, TAKE 1 TABLET BY MOUTH EVERYDAY AT BEDTIME   Reviewed prior external information including notes and imaging from  primary care provider As well as notes that were available from care everywhere and other healthcare systems.  Past medical history, social, surgical and family history all reviewed in electronic medical record.  [name] pertanent information unless stated regarding to the chief complaint.   Review of Systems:  [name] headache, visual changes, nausea, vomiting, diarrhea, constipation, dizziness, abdominal pain, skin rash, fevers, chills, night sweats, weight loss, swollen lymph nodes,, joint swelling, chest pain, shortness of breath, mood changes. POSITIVE muscle aches, body aches  Objective  Blood pressure 116/88, pulse 79, height 5\' 10"  (1.778 m), weight 253 lb (114.8 kg), SpO2 98 %.   General: [name] apparent distress alert and oriented x3 mood and affect normal, dressed appropriately.  HEENT: Pupils equal, extraocular movements intact  Respiratory: Patient's speak in full sentences and does not appear short of breath  Cardiovascular: [name] lower extremity edema, non tender, [name] erythema  Gait mild antalgic MSK:   Patient's low back exam does have significant loss of lordosis.  Patient does have tenderness to palpation mostly over the left sacroiliac joint.  Patient is unable to do Riley secondary to multiple different reasons including body habitus.  Patient is neurovascularly intact distally.  Negative straight leg test at this time.   After verbal consent patient was prepped with alcohol  swab and with a 21-gauge 2 inch needle injected in the vicinity of the left sacroiliac joint.  A total of 2 cc of 0.5% Marcaine and 1 cc of Kenalog 40 mg/mL used.  [name] blood loss.  Band-Aid placed.  Postinjection instructions given.    Impression and Recommendations:     The above  documentation has been reviewed and is accurate and complete Lyndal Pulley, DO       Note: This dictation was prepared with Dragon dictation along with smaller phrase technology. Any transcriptional errors that result from this process are unintentional.

## 2020-07-12 NOTE — Assessment & Plan Note (Addendum)
History of low back pain.  Patient's last imaging was low from an MRI in 2015.  Found to have some arthritic changes.  Patient given a sacroiliac joint injection today and hoping that this will be beneficial.  Discussed icing regimen, patient is on multiple different medications and multiple comorbidities.  X-rays ordered today.  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  We will see how patient responds to the injection.  Worsening symptoms advanced imaging would be warranted.  Unable to do anti-inflammatories secondary to patient's Coumadin level.  Patient given some mild prednisone but to use very sparingly.

## 2020-07-12 NOTE — Telephone Encounter (Signed)
Advanced Heart Failure Patient Advocate Encounter   Patient was approved to receive Entresto from [employer].  Patient ID: [id number]  Effective dates: 07/11/20 through 10/14/20  Called and spoke with the patient.  Charlann Boxer, CPhT

## 2020-07-13 ENCOUNTER — Ambulatory Visit (INDEPENDENT_AMBULATORY_CARE_PROVIDER_SITE_OTHER): Payer: Medicare HMO

## 2020-07-13 DIAGNOSIS — M545 Low back pain, unspecified: Secondary | ICD-10-CM

## 2020-07-13 DIAGNOSIS — M48061 Spinal stenosis, lumbar region without neurogenic claudication: Secondary | ICD-10-CM | POA: Diagnosis not present

## 2020-07-14 ENCOUNTER — Other Ambulatory Visit: Payer: Self-pay

## 2020-07-14 ENCOUNTER — Ambulatory Visit (HOSPITAL_COMMUNITY)
Admission: RE | Admit: 2020-07-14 | Discharge: 2020-07-14 | Disposition: A | Discharge status: Home / Self Care | Payer: Medicare HMO | Source: Ambulatory Visit | Attending: Internal Medicine | Admitting: Internal Medicine

## 2020-07-14 DIAGNOSIS — I5030 Unspecified diastolic (congestive) heart failure: Secondary | ICD-10-CM | POA: Insufficient documentation

## 2020-07-14 LAB — BASIC METABOLIC PANEL
Anion gap: 12 (ref 5–15)
BUN: 22 mg/dL (ref 8–23)
CO2: 23 mmol/L (ref 22–32)
Calcium: 9.3 mg/dL (ref 8.9–10.3)
Chloride: 99 mmol/L (ref 98–111)
Creatinine, Ser: 1.19 mg/dL (ref 0.61–1.24)
GFR calc Af Amer: >60 mL/min (ref >60)
GFR calc non Af Amer: >60 mL/min (ref >60)
Glucose, Bld: 132 mg/dL — ABNORMAL HIGH (ref 70–99)
Potassium: 4.7 mmol/L (ref 3.5–5.1)
Sodium: 134 mmol/L — ABNORMAL LOW (ref 135–145)

## 2020-07-19 ENCOUNTER — Other Ambulatory Visit: Payer: Self-pay

## 2020-07-19 ENCOUNTER — Ambulatory Visit (INDEPENDENT_AMBULATORY_CARE_PROVIDER_SITE_OTHER): Payer: Medicare HMO | Admitting: General Practice

## 2020-07-19 DIAGNOSIS — Z7901 Long term (current) use of anticoagulants: Secondary | ICD-10-CM

## 2020-07-19 LAB — POCT INR: INR: 4 — AB (ref 2.0–3.0)

## 2020-07-19 NOTE — Progress Notes (Signed)
Medical screening examination/treatment/procedure(s) were performed by non-physician practitioner and as supervising physician I was immediately available for consultation/collaboration. I agree with above. Gwenna Fuston, MD   

## 2020-07-19 NOTE — Patient Instructions (Addendum)
Pre visit review using our clinic review tool, if applicable. No additional management support is needed unless otherwise documented below in the visit note.  Hold dosage today and tomorrow and then start taking 1 tablet daily except 1/2 tablet on Monday and Fridays.  Re-check in 3 weeks.

## 2020-07-22 ENCOUNTER — Ambulatory Visit: Payer: Medicare HMO | Admitting: Podiatry

## 2020-07-25 ENCOUNTER — Other Ambulatory Visit: Payer: Self-pay | Admitting: Internal Medicine

## 2020-07-25 DIAGNOSIS — I428 Other cardiomyopathies: Secondary | ICD-10-CM

## 2020-07-25 DIAGNOSIS — I255 Ischemic cardiomyopathy: Secondary | ICD-10-CM

## 2020-07-25 DIAGNOSIS — I4819 Other persistent atrial fibrillation: Secondary | ICD-10-CM

## 2020-07-26 ENCOUNTER — Ambulatory Visit (INDEPENDENT_AMBULATORY_CARE_PROVIDER_SITE_OTHER): Payer: Medicare HMO

## 2020-07-26 ENCOUNTER — Ambulatory Visit: Payer: Medicare HMO | Admitting: Podiatry

## 2020-07-26 ENCOUNTER — Other Ambulatory Visit: Payer: Self-pay

## 2020-07-26 DIAGNOSIS — I739 Peripheral vascular disease, unspecified: Secondary | ICD-10-CM | POA: Diagnosis not present

## 2020-07-26 DIAGNOSIS — M21612 Bunion of left foot: Secondary | ICD-10-CM

## 2020-07-26 DIAGNOSIS — M21611 Bunion of right foot: Secondary | ICD-10-CM | POA: Diagnosis not present

## 2020-07-26 DIAGNOSIS — E119 Type 2 diabetes mellitus without complications: Secondary | ICD-10-CM

## 2020-07-26 NOTE — Telephone Encounter (Signed)
Done erx 

## 2020-07-26 NOTE — Patient Instructions (Addendum)
Bunion Surgery  Bunion surgery is done to remove a bunion, which is a bony bump on the big toe joint, on the inner side of the foot. A bunion can develop over time when pressure turns the big toe and joint toward the other toes. Bunions may be caused by wearing shoes that are too tight or narrow. They can also be caused by conditions such as rheumatoid arthritis and flat feet. You may need bunion surgery if your bunion is very large or painful or it affects your ability to walk. Tell a health care provider about:  Any allergies you have.  All medicines you are taking, including vitamins, herbs, eye drops, creams, and over-the-counter medicines.  Any problems you or family members have had with anesthetic medicines.  Any blood disorders you have.  Any surgeries you have had.  Any medical conditions you have.  Whether you are pregnant or may be pregnant. What are the risks? Generally, this is a safe procedure. However, problems may occur, including:  Infection.  Bleeding or blood clots.  Allergic reactions to medicines.  Nerve damage.  Pain.  Numbness, stiffness, or arthritis in the toe.  Return of the bunion.  Failure of the bone to heal completely after surgery.  Failure of the surgery to relieve symptoms. What happens before the procedure? General instructions  Ask your health care provider about: ? Changing or stopping your regular medicines. This is especially important if you are taking diabetes medicines or blood thinners. ? Taking medicines such as aspirin and ibuprofen. These medicines can thin your blood. Do not take these medicines unless your health care provider tells you to take them. ? Taking over-the-counter medicines, vitamins, herbs, and supplements.  Do not drink alcohol before the procedure as told by your health care provider.  Do not use any products that contain nicotine or tobacco, such as cigarettes and e-cigarettes. These can  delay bone healing. If you need help quitting, ask your health care provider.  Plan to have someone take you home from the hospital or clinic.  Plan to have a responsible adult care for you for at least 24 hours after you leave the hospital or clinic. This is important.  Ask your health care provider how your surgical site will be marked or identified. Staying hydrated Follow instructions from your health care provider about hydration, which may include:  Up to 2 hours before the procedure - you may continue to drink clear liquids, such as water, clear fruit juice, black coffee, and plain tea. Eating and drinking restrictions Follow instructions from your health care provider about eating and drinking, which may include:  8 hours before the procedure - stop eating heavy meals or foods such as meat, fried foods, or fatty foods.  6 hours before the procedure - stop eating light meals or foods, such as toast or cereal.  6 hours before the procedure - stop drinking milk or drinks that contain milk.  2 hours before the procedure - stop drinking clear liquids. What happens during the procedure?  To lower your risk of infection: ? Your health care team will wash or sanitize their hands. ? Your skin will be washed with soap.  An IV will be inserted into one of your veins.  You will be given one of the following: ? A medicine to numb the area (local anesthetic). ? A medicine to make you fall asleep (general anesthetic).  An incision will be made over the  bump at the big toe joint. Depending on the type of procedure that is needed for your bunion, your surgeon may do one or more of the following: ? Remove the bunion (exostectomy). ? Cut or replace the damaged joint in your big toe (osteotomy). ? Use screws or other hardware to keep your foot in the correct position (arthrodesis). ? Tighten or loosen tissues around the big toe to reposition the toe.  The incision will be closed with  stitches (sutures) and covered with adhesive strips or another type of bandage (dressing).  A supportive device, such as a cast that you can walk on (boot), may be placed on your foot. The procedure may vary among health care providers and hospitals. What happens after the procedure?  Your blood pressure, heart rate, breathing rate, and blood oxygen level will be monitored until the medicines you were given have worn off.  Do not drive for 24 hours if you were given a sedative during your procedure.  Ask your health care provider when it is safe to drive if you have a boot or other supportive device on your foot.  You may be given instructions about how much body weight you can or cannot support on your foot (weight-bearing restrictions). You may be given crutches, a cane, or a walker to help you move around so that you do not put (bear) weight on your foot. Summary  Bunion surgery is done to remove a bunion, which is a bony bump on the big toe joint, on the inner side of the foot.  You may need bunion surgery if your bunion is very large or painful or it affects your ability to walk.  Before the procedure, follow instructions from your health care provider about eating and drinking, and ask about changing or stopping your regular medicines. This information is not intended to replace advice given to you by your health care provider. Make sure you discuss any questions you have with your health care provider. Document Revised: 09/13/2017 Document Reviewed: 07/08/2017 Elsevier Patient Education  [place].  Diabetes Mellitus and [place] care is an important part of your health, especially when you have diabetes. Diabetes may cause you to have problems because of poor blood flow (circulation) to your feet and legs, which can cause your skin to:  Become thinner and drier.  Break more easily.  Heal more slowly.  Peel and crack. You may also have nerve damage (neuropathy)  in your legs and feet, causing decreased feeling in them. This means that you may not notice minor injuries to your feet that could lead to more serious problems. Noticing and addressing any potential problems early is the best way to prevent future foot problems. How to care for your feet Foot hygiene  Wash your feet daily with warm water and mild soap. Do not use hot water. Then, pat your feet and the areas between your toes until they are completely dry. Do not soak your feet as this can dry your skin.  Trim your toenails straight across. Do not dig under them or around the cuticle. File the edges of your nails with an emery board or nail file.  Apply a moisturizing lotion or petroleum jelly to the skin on your feet and to dry, brittle toenails. Use lotion that does not contain alcohol and is unscented. Do not apply lotion between your toes. Shoes and socks  Wear clean socks or stockings every day. Make sure they are not too tight.  Do not wear knee-high stockings since they may decrease blood flow to your legs.  Wear shoes that fit properly and have enough cushioning. Always look in your shoes before you put them on to be sure there are no objects inside.  To break in new shoes, wear them for just a few hours a day. This prevents injuries on your feet. Wounds, scrapes, corns, and calluses  Check your feet daily for blisters, cuts, bruises, sores, and redness. If you cannot see the bottom of your feet, use a mirror or ask someone for help.  Do not cut corns or calluses or try to remove them with medicine.  If you find a minor scrape, cut, or break in the skin on your feet, keep it and the skin around it clean and dry. You may clean these areas with mild soap and water. Do not clean the area with peroxide, alcohol, or iodine.  If you have a wound, scrape, corn, or callus on your foot, look at it several times a day to make sure it is healing and not infected. Check for: ? Redness, swelling,  or pain. ? Fluid or blood. ? Warmth. ? Pus or a bad smell. General instructions  Do not cross your legs. This may decrease blood flow to your feet.  Do not use heating pads or hot water bottles on your feet. They may burn your skin. If you have lost feeling in your feet or legs, you may not know this is happening until it is too late.  Protect your feet from hot and cold by wearing shoes, such as at the beach or on hot pavement.  Schedule a complete foot exam at least once a year (annually) or more often if you have foot problems. If you have foot problems, report any cuts, sores, or bruises to your health care provider immediately. Contact a health care provider if:  You have a medical condition that increases your risk of infection and you have any cuts, sores, or bruises on your feet.  You have an injury that is not healing.  You have redness on your legs or feet.  You feel burning or tingling in your legs or feet.  You have pain or cramps in your legs and feet.  Your legs or feet are numb.  Your feet always feel cold.  You have pain around a toenail. Get help right away if:  You have a wound, scrape, corn, or callus on your foot and: ? You have pain, swelling, or redness that gets worse. ? You have fluid or blood coming from the wound, scrape, corn, or callus. ? Your wound, scrape, corn, or callus feels warm to the touch. ? You have pus or a bad smell coming from the wound, scrape, corn, or callus. ? You have a fever. ? You have a red line going up your leg. Summary  Check your feet every day for cuts, sores, red spots, swelling, and blisters.  Moisturize feet and legs daily.  Wear shoes that fit properly and have enough cushioning.  If you have foot problems, report any cuts, sores, or bruises to your health care provider immediately.  Schedule a complete foot exam at least once a year (annually) or more often if you have foot problems. This information is not  intended to replace advice given to you by your health care provider. Make sure you discuss any questions you have with your health care provider. Document Revised: 06/24/2019 Document Reviewed: 11/02/2016 Elsevier Patient  Education  2020 Elsevier Inc.  

## 2020-07-27 ENCOUNTER — Ambulatory Visit (HOSPITAL_COMMUNITY)
Admission: RE | Admit: 2020-07-27 | Discharge: 2020-07-27 | Disposition: A | Discharge status: Home / Self Care | Payer: Medicare HMO | Source: Ambulatory Visit | Attending: Podiatry | Admitting: Podiatry

## 2020-07-27 ENCOUNTER — Other Ambulatory Visit: Payer: Self-pay

## 2020-07-27 DIAGNOSIS — E119 Type 2 diabetes mellitus without complications: Secondary | ICD-10-CM | POA: Insufficient documentation

## 2020-07-27 DIAGNOSIS — I739 Peripheral vascular disease, unspecified: Secondary | ICD-10-CM | POA: Diagnosis not present

## 2020-07-27 NOTE — Progress Notes (Signed)
  Subjective:  Patient ID: [name], male    DOB: Jul 09, 1956,  MRN: [id number]  Chief Complaint  Patient presents with  . Bunions    np/painful bunion w/ possible hammertoe   . Hammer Toe  . Ingrown Toenail    64 y.o. male presents with the above complaint. History confirmed with patient.  He complains primarily of a painful bunion on the right foot which is become worsened over the last couple years.  He is tried wider shoes and over-the-counter orthotics and these were not helpful.  Objective:  Physical Exam: warm, good capillary refill, no trophic changes or ulcerative lesions, normal sensory exam, +2 DP and PT reduced bilateral and venous stasis dermatitis noted.  Moderate to severe hallux abductovalgus with hammertoe contractures 2 through 5 bilaterally, right worse than left.   Radiographs: X-ray of the right foot: Moderate hallux abductovalgus with the presence of metatarsus adductus deformity and pes planus Assessment:   1. Bilateral bunions   2. Type 2 diabetes mellitus without complication, without long-term current use of insulin ([place])   3. Peripheral vascular disease ([place])      Plan:  Patient was evaluated and treated and all questions answered.  Patient educated on diabetes. Discussed proper diabetic foot care and discussed risks and complications of disease. Educated patient in depth on reasons to return to the office immediately should he/she discover anything concerning or new on the feet. All questions answered. Discussed proper shoes as well.  I recommend we try diabetic shoes with custom molded insert to offload the area and hopefully provide him some pain relief from the bunion.  I discussed with him in detail and reviewed his x-ray regarding the bunion he has on his right foot as well as the contracted second toe.  I discussed with him that he is not an ideal candidate for bunion surgery given his diabetes albeit well controlled at 6.1% as of June 2021.  He  does have peripheral venous disease, and some edema with a weakly palpable PT pulse.  I recommended that we check his noninvasive vascular testing prior to considering surgery to mitigate any possible risks.  ABI/TBI/arterial ultrasound were ordered.  Regarding the bunion itself, I discussed that if the above testing is normal and the wider, extra-depth, diabetic shoes with accommodative insoles are not helpful that we could consider correcting the deformity.  Surgical I think he would need correction of the first ray and the second hammertoe.  I discussed with him that there is metatarsus adductus deformity the ideal surgical procedure would likely be a Lapidus bunionectomy with osteotomies of the second and third rays, however I think that this would be too risky for him.  A compromise would be to perform a percutaneous small incision correction under fluoroscopy with a chevron osteotomy and Akin osteotomy in order to straighten the hallux, remove the medial eminence as well as second hammertoe correction.  He will consider this and we will discuss at his next visit.  Return in about 3 months (around 10/26/2020).

## 2020-07-28 ENCOUNTER — Other Ambulatory Visit (HOSPITAL_COMMUNITY): Payer: Self-pay | Admitting: *Deleted

## 2020-07-28 ENCOUNTER — Other Ambulatory Visit (HOSPITAL_COMMUNITY): Payer: Self-pay | Admitting: Cardiology

## 2020-07-28 MED ORDER — WARFARIN SODIUM 5 MG PO TABS
5.0000 mg | ORAL_TABLET | Freq: Every day | ORAL | 6 refills | Status: DC
Start: 2020-07-28 — End: 2020-09-16

## 2020-08-01 ENCOUNTER — Telehealth (HOSPITAL_COMMUNITY): Payer: Self-pay | Admitting: *Deleted

## 2020-08-01 ENCOUNTER — Ambulatory Visit (INDEPENDENT_AMBULATORY_CARE_PROVIDER_SITE_OTHER): Payer: Medicare HMO

## 2020-08-01 DIAGNOSIS — Z95810 Presence of automatic (implantable) cardiac defibrillator: Secondary | ICD-10-CM

## 2020-08-01 DIAGNOSIS — I5022 Chronic systolic (congestive) heart failure: Secondary | ICD-10-CM | POA: Diagnosis not present

## 2020-08-01 NOTE — Progress Notes (Addendum)
Patient ID: [name]                 DOB: Feb 14, 1956                    MRN: [id number]     HPI: [name] is a 64 y.o. male patient referred to lipid clinic by Dr. [name]. PMH is significant for HTN, Afib, MI in 9798, Chronic systolic CHF nonischemic cardiomyopathy (EF 35-40%), CAD, ICD, OSA, COPD, T2DM, HLD, GERD, BPH, obesity, and anxiety/depression. CTA on 12/09/19 shows foci of aortic atherosclerosis and coronary artery calcification.  Patient presents today in good spirits ambulating with a cane. Reports currently not on any lipid-lowering agent for a while. Reports leg pain and myalgias with atorvastatin, rosuvastatin, and ezetimibe. Reports diet is not the best (see below) and unable to exercise due to history of prosthetic knee and knee replacements.   Current Medications: none Intolerances: atorvastatin 20, 40 mg daily (myalgias), rosuvastatin 5 mg daily (myalgias), Zetia 10 mg daily (myalgias) Risk Factors: MI, HTN, CHF, CAD, HLD, obesity, former smoker, T2DM LDL goal: <70 mg/dL  Diet: Does not eat vegetables, eats fast food ([employer] 3x/week) and fried food, limits pork, not aware of salt intake  Breakfast: cheerios without milk Lunch: fruits Dinner: it varies - canned chicken, steak  Snacks: cake, ice cream, yogurt Drinks: caffeine-free gingerale, water, diet coke  Exercise: limited due to two knee replacements and a cane  Family History: mother (heart disease)  Social History: former smoker   Labs: 03/23/20: LDL 109, TG 253, TC 192, HDL 42.5 (none) 11/06/19: LDL 136, TG 60, TC 206, HDL 58 (none)  Past Medical History:  Diagnosis Date  . AICD (automatic cardioverter/defibrillator) present   . Anemia    supposed to be taking Vit B but doesn't  . ANXIETY    takes Xanax nightly  . Arthritis   . Asthma    Albuterol prn and Advair daily;also takes Prednisone daily  . Atrial fibrillation ([place]) 09/06/2015  . Cardiomyopathy [facility])    a. EF 25% TEE July 2013; b. EF  normalized 2015;  c. 03/2015 Echo: EF 40-45%, difrf HK, PASP 38 mmHg, Mild MR, sev LAE/RAE.  [name] Chronic constipation    takes OTC stool softener  . COPD (chronic obstructive pulmonary disease) ([place])    "one dr says COPD; one dr says emphysema" (09/18/2017)  . DEPRESSION    takes Zoloft and Doxepin daily  . Diverticulitis   . DYSKINESIA, ESOPHAGUS   . Emphysema of lung [facility])    "one dr says COPD; one dr says emphysema" (09/18/2017)  . Essential hypertension       . FIBROMYALGIA   . GERD (gastroesophageal reflux disease)       . Glaucoma   . HYPERLIPIDEMIA    a. Intolerant to statins.  . INSOMNIA    takes Ambien nightly  . Myocardial infarction [facility])    a. 2012 Myoview notable for prior infarct;  b. 03/2015 Lexiscan CL: EF 37%, diff HK, small area of inferior infarct from apex to base-->Med Rx.  . Myocardial infarction ([place])   . O2 dependent    "2.5L q hs & prn" (09/18/2017)  . Paroxysmal atrial fibrillation ([facility])    a. CHA2DS2VASc = 3--> takes Coumadin;  b. 03/15/2015 Successful TEE/DCCV;  c. 03/2015 recurrent afib, Amio d/c'd in setting of hyperthyroidism.  . Peripheral neuropathy   . Pneumonia 12/2016  . Rash and other nonspecific skin eruption 04/12/2009   no  cause found saw dermatologists x 2 and allergist  . SLEEP APNEA, OBSTRUCTIVE    a. doesn't use CPAP  . Syncope    a. 03/2015 s/p MDT LINQ.  [name] Type II diabetes mellitus ([place])         Current Outpatient Medications on File Prior to Visit  Medication Sig Dispense Refill  . acetaminophen (TYLENOL) 500 MG tablet Take 500 mg by mouth every 6 (six) hours as needed.    [name] albuterol (VENTOLIN HFA) 108 (90 Base) MCG/ACT inhaler INHALE 2 PUFFS 4 TIMES A DAY as needed 54 g 3  . ALPRAZolam (XANAX) 0.5 MG tablet TAKE 2 TABLETS BY MOUTH AT BEDTIME 60 tablet 2  . carisoprodol (SOMA) 350 MG tablet TAKE 1 TABLET BY MOUTH THREE TIMES A DAY AS NEEDED FOR MUSCLE SPASMS 90 tablet 5  . carvedilol (COREG) 12.5 MG tablet Take 1 tablet (12.5 mg  total) by mouth 2 (two) times daily with a meal. 180 tablet 3  . digoxin (LANOXIN) 0.25 MG tablet TAKE 1/2 TABLET BY MOUTH EVERY DAY 45 tablet 3  . doxepin (SINEQUAN) 10 MG capsule TAKE 2 CAPSULES BY MOUTH AT BEDTIME. ANNUAL APPT DUE IN JUNE MUST SEE PROVIDER FOR FUTURE REFILLS 180 capsule 0  . fluticasone (FLONASE) 50 MCG/ACT nasal spray PLACE 2 SPRAYS INTO BOTH NOSTRILS DAILY AS NEEDED FOR ALLERGIES OR RHINITIS. 48 mL 1  . hydrocortisone 2.5 % cream APPLY TO AFFECTED AREA TWICE A DAY 28.35 g 6  . hydrOXYzine (ATARAX/VISTARIL) 10 MG tablet TAKE 1 TABLET (10 MG TOTAL) BY MOUTH 3 (THREE) TIMES DAILY AS NEEDED FOR ITCHING. 60 tablet 2  . lidocaine (LIDODERM) 5 % APPLY 1 PATCH TO AFFECTED AREA FOR UP TO 12 HOURS . DO NOT USE MORE THAN ONE PATCH IN 24 HOURS. 90 patch 2  . methotrexate (RHEUMATREX) 2.5 MG tablet TAKE 4 TABLETS BY MOUTH EVERY WEEK 48 tablet 1  . omeprazole (PRILOSEC) 20 MG capsule TAKE 1 CAPSULE BY MOUTH TWICE A DAY 180 capsule 1  . predniSONE (DELTASONE) 20 MG tablet Take 1 tablet (20 mg total) by mouth daily with breakfast. 10 tablet 0  . sacubitril-valsartan (ENTRESTO) 24-26 MG Take 1 tablet by mouth 2 (two) times daily. 60 tablet 3  . silver sulfADIAZINE (SILVADENE) 1 % cream APPLY TOPICALLY TO AFFECTED AREA EVERY DAY 50 g 3  . spironolactone (ALDACTONE) 25 MG tablet TAKE 1 TABLET BY MOUTH EVERY DAY 90 tablet 3  . tamsulosin (FLOMAX) 0.4 MG CAPS capsule     . torsemide (DEMADEX) 20 MG tablet Take 3 tablets (60 mg total) every morning and 2 tablets (40 mg total) every evening. 450 tablet 1  . traMADol (ULTRAM) 50 MG tablet Take 1 tablet (50 mg total) by mouth daily as needed. 30 tablet 5  . traZODone (DESYREL) 100 MG tablet TAKE 1/2 TABLET BY MOUTH EVERY DAY AT BEDTIME AS NEEDED 45 tablet 1  . triamcinolone (KENALOG) 0.1 % paste APPLY 1 APPLICATION IN THE MOUTH OR THROAT 2 (TWO) TIMES DAILY AS DIRECTED 5 g 2  . TUMS 500 MG chewable tablet Chew 500-2,000 mg by mouth every 4 (four)  hours as needed for indigestion or heartburn.     . valACYclovir (VALTREX) 1000 MG tablet TAKE 1 TABLET BY MOUTH THREE TIMES A DAY 21 tablet 2  . venlafaxine XR (EFFEXOR-XR) 37.5 MG 24 hr capsule Take 1 capsule (37.5 mg total) by mouth daily with breakfast. 90 capsule 1  . warfarin (COUMADIN) 5 MG tablet Take 1  tablet (5 mg total) by mouth daily. 30 tablet 6  . zolpidem (AMBIEN) 10 MG tablet TAKE 1 TABLET BY MOUTH EVERYDAY AT BEDTIME 90 tablet 1   No current facility-administered medications on file prior to visit.    Allergies  Allergen Reactions  . Amiodarone Other (See Comments)    hyperthyroidism  . Statins Other (See Comments)    myalgia  . Tape Other (See Comments)    Skin Tears Use Paper Tape Only    Assessment/Plan:  1. Hyperlipidemia - LDL not at goal <70 mg/dL due to history of CAD, T2DM, HLD and CHF. Patient is not on lipid-lowering therapy and has history of myalgias with atorvastatin, rosuvastatin, and ezetimibe. Patient will benefit from a PCSK9-inhibitor, however copay cost is prohibitive and [employer] patient assistance program is closed. Patient is amendable to trying pravastatin 20 mg daily. Instructed patient to start taking 0.5 tablet of pravastatin 20 mg daily for the first few weeks, and if no issues, increase to 1 tablet daily. Patient verbalized understanding. Discussed enrolling in the [facility] clinical trial and patient is interested in learning more. Will have research team reach out to patient in 4 weeks to discuss enrollment. Discussed healthy eating options such as a diet full of vegetables, fruit and lean meats (chicken, [place], fish) and to limit salt, carbs (bread, pasta, sugar, rice), and red meat consumption. Will schedule fasting lipid panel and LFTs in 3 months.  Lorel Monaco, PharmD PGY2 [facility] [address] [place], [place] [address] Phone: [phone]; Fax: [phone]

## 2020-08-01 NOTE — Telephone Encounter (Signed)
Increase torsemide to 60 mg bid x 3 days then 60 qam/40 qpm after that with BMET in 1 week.  Make sure he has followup appt.

## 2020-08-01 NOTE — Telephone Encounter (Signed)
Pt called stating his legs are very swollen they started swelling over the weekend pt also c/o  More shortness of breath. PT said he is taking all medications as prescribed.   Routed to [facility] for advice.

## 2020-08-02 ENCOUNTER — Other Ambulatory Visit: Payer: Self-pay

## 2020-08-02 ENCOUNTER — Ambulatory Visit (INDEPENDENT_AMBULATORY_CARE_PROVIDER_SITE_OTHER): Payer: Medicare HMO | Admitting: Pharmacist

## 2020-08-02 DIAGNOSIS — T466X5A Adverse effect of antihyperlipidemic and antiarteriosclerotic drugs, initial encounter: Secondary | ICD-10-CM | POA: Diagnosis not present

## 2020-08-02 DIAGNOSIS — G720 Drug-induced myopathy: Secondary | ICD-10-CM

## 2020-08-02 DIAGNOSIS — E7849 Other hyperlipidemia: Secondary | ICD-10-CM

## 2020-08-02 MED ORDER — PRAVASTATIN SODIUM 20 MG PO TABS: 20.0000 mg | ORAL_TABLET | Freq: Every evening | ORAL | 3 refills | Status: DC

## 2020-08-02 NOTE — Telephone Encounter (Signed)
Pt returned call he is aware and verbalized understanding. Lab appt scheduled.

## 2020-08-02 NOTE — Patient Instructions (Signed)
Nice to see you today!  Aim for a diet full of vegetables, fruit and lean meats (chicken, [place], fish). Try to limit carbs (bread, pasta, sugar, rice) and red meat consumption.  Your goal LDL is < 70mg /dL, you're currently at 109 mg/dL  Medication Changes: Begin taking 0.5 tablet of Pravastatin 20 mg daily for the first 2-3 weeks, if you do not have any issues, then increase to 1 tablet daily.  Our research team will also give you a call about the details regarding the clinical trial  Please give [place] a call at [phone] with any questions or concerns.  See you in 3 months for fasting labs - January 13th, 2022

## 2020-08-02 NOTE — Telephone Encounter (Signed)
Left vm requesting return call.  

## 2020-08-05 ENCOUNTER — Telehealth: Payer: Self-pay

## 2020-08-05 NOTE — Progress Notes (Signed)
EPIC Encounter for ICM Monitoring  Patient Name: [name] is a 64 y.o. male Date: 08/05/2020 Primary Care Physican: [name], MD Primary Cardiologist:[place] Electrophysiologist:[facility] Bi-V Pacing: 100% 06/28/2020 Weight: 237 lbs  Attempted call to patient and unable to reach.  Left detailed message per DPR regarding transmission. Transmission reviewed.    Optivol thoracic impedancenormal but was suggesting possible fluid accumulation from 10/7-10/18.  Prescribed:  Torsemide 20 mg to3tablets (60 mg total)and 2 tablets (40 mg total) every evening.  Spironolactone 25 mg take 1 tablet daily.  Labs: 07/14/2020 Creatinine 1.19, BUN 22, Potassium 4.7, Sodium 134, GFR >60 04/22/2020 Creatinine 1.15, BUN 34, Potassium 3.7, Sodium 139, GFR >60 03/11/2020 Creatinine 0.80, BUN 15, Potassium 4.8, Sodium 139, GFR >60 12/22/2019 Creatinine 1.08, BUN 24, Potassium 4.0, Sodium 139, GFR >60 A complete set of results can be found in Results Review.  Recommendations: Left voice mail with ICM number and encouraged to call if experiencing any fluid symptoms.  Follow-up plan: ICM clinic phone appointment on11/22/2021. 91 day device clinic remote transmission12/13/2021.  EP/Cardiology Office Visits: 10/10/2020 with [facility] clinic.   Copy of ICM check sent to [place].   3 month ICM trend: 08/03/2020    1 Year ICM trend:       [name], RN 08/05/2020 9:09 AM

## 2020-08-05 NOTE — Telephone Encounter (Signed)
Remote ICM transmission received.  Attempted call to patient regarding ICM remote transmission and left detailed message per DPR.  Advised to return call for any fluid symptoms or questions. Next ICM remote transmission scheduled 09/05/2020.

## 2020-08-09 ENCOUNTER — Other Ambulatory Visit: Payer: Self-pay

## 2020-08-09 ENCOUNTER — Other Ambulatory Visit (HOSPITAL_COMMUNITY): Payer: Medicare HMO

## 2020-08-09 ENCOUNTER — Ambulatory Visit: Payer: Medicare HMO | Admitting: Orthotics

## 2020-08-09 ENCOUNTER — Ambulatory Visit (INDEPENDENT_AMBULATORY_CARE_PROVIDER_SITE_OTHER): Payer: Medicare HMO | Admitting: General Practice

## 2020-08-09 DIAGNOSIS — Z7901 Long term (current) use of anticoagulants: Secondary | ICD-10-CM | POA: Diagnosis not present

## 2020-08-09 DIAGNOSIS — M21611 Bunion of right foot: Secondary | ICD-10-CM

## 2020-08-09 DIAGNOSIS — E119 Type 2 diabetes mellitus without complications: Secondary | ICD-10-CM

## 2020-08-09 DIAGNOSIS — I739 Peripheral vascular disease, unspecified: Secondary | ICD-10-CM

## 2020-08-09 DIAGNOSIS — M21612 Bunion of left foot: Secondary | ICD-10-CM

## 2020-08-09 LAB — POCT INR: INR: 5.2 — AB (ref 2.0–3.0)

## 2020-08-09 NOTE — Progress Notes (Signed)
Medical screening examination/treatment/procedure(s) were performed by non-physician practitioner and as supervising physician I was immediately available for consultation/collaboration. I agree with above. Cybil Senegal, MD   

## 2020-08-09 NOTE — Progress Notes (Signed)

## 2020-08-09 NOTE — Patient Instructions (Signed)
Pre visit review using our clinic review tool, if applicable. No additional management support is needed unless otherwise documented below in the visit note.  Hold dosage today, tomorrow and Thursday.  On Friday start taking 1/2 tablet daily except 1 tablet on Monday Wed and Fridays.  Re-check in 2 weeks. Patient will reduce ibuprofen by 1/2 (2 tablets 3 times daily).

## 2020-08-10 ENCOUNTER — Other Ambulatory Visit: Payer: Self-pay

## 2020-08-10 ENCOUNTER — Ambulatory Visit: Payer: Self-pay

## 2020-08-10 ENCOUNTER — Ambulatory Visit: Payer: Medicare HMO | Admitting: Family Medicine

## 2020-08-10 ENCOUNTER — Encounter: Payer: Self-pay | Admitting: Family Medicine

## 2020-08-10 VITALS — BP 122/74 | HR 76 | Ht 70.0 in

## 2020-08-10 DIAGNOSIS — M47817 Spondylosis without myelopathy or radiculopathy, lumbosacral region: Secondary | ICD-10-CM

## 2020-08-10 DIAGNOSIS — M12811 Other specific arthropathies, not elsewhere classified, right shoulder: Secondary | ICD-10-CM

## 2020-08-10 DIAGNOSIS — M25511 Pain in right shoulder: Secondary | ICD-10-CM

## 2020-08-10 DIAGNOSIS — M1712 Unilateral primary osteoarthritis, left knee: Secondary | ICD-10-CM

## 2020-08-10 DIAGNOSIS — Z7901 Long term (current) use of anticoagulants: Secondary | ICD-10-CM | POA: Diagnosis not present

## 2020-08-10 DIAGNOSIS — M12812 Other specific arthropathies, not elsewhere classified, left shoulder: Secondary | ICD-10-CM

## 2020-08-10 DIAGNOSIS — M25512 Pain in left shoulder: Secondary | ICD-10-CM

## 2020-08-10 DIAGNOSIS — G8929 Other chronic pain: Secondary | ICD-10-CM | POA: Diagnosis not present

## 2020-08-10 MED ORDER — PREDNISONE 20 MG PO TABS
20.0000 mg | ORAL_TABLET | Freq: Every day | ORAL | 0 refills | Status: DC
Start: 2020-08-10 — End: 2020-09-04

## 2020-08-10 NOTE — Progress Notes (Signed)
[place] Phone: [phone] Subjective:   [name], am serving as a scribe for Dr. Hulan Saas. This visit occurred during the SARS-CoV-2 public health emergency.  Safety protocols were in place, including screening questions prior to the visit, additional usage of staff PPE, and extensive cleaning of exam room while observing appropriate contact time as indicated for disinfecting solutions.   I'm seeing this patient by the request  of:  [name], MD  CC: Left knee pain multiple other pains  YYQ:[id number]   07/12/2020 History of low back pain.  Patient's last imaging was low from an MRI in 2015.  Found to have some arthritic changes.  Patient given a sacroiliac joint injection today and hoping that this will be beneficial.  Discussed icing regimen, patient is on multiple different medications and multiple comorbidities.  X-rays ordered today.  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  We will see how patient responds to the injection.  Worsening symptoms advanced imaging would be warranted.  Unable to do anti-inflammatories secondary to patient's Coumadin level.  Patient given some mild prednisone but to use very sparingly.  Patient has been taking actually 5 mg daily for quite some time without this narrowing cardiomyopathy 20 mg burst on his medication previously.   Update 08/10/2020 RAJON BISIG is a 64 y.o. male coming in with complaint of shoulder and knee pain. Patient states he is having left hip and left knee pain. Would like injections in each of these areas today.  Patient states that the back pain did get a little better after the injection in the sacroiliac joint.  Still having the low back pain.  Did have x-rays done at last exam.  X-rays show that patient does have interval development of a grade 1 anterior  listhesis at L4-L5.  This is in comparison to patient's previous x-rays in 2014.     Past Medical History:  Diagnosis Date  . AICD (automatic cardioverter/defibrillator) present   . Anemia    supposed to be taking Vit B but doesn't  . ANXIETY    takes Xanax nightly  . Arthritis   . Asthma    Albuterol prn and Advair daily;also takes Prednisone daily  . Atrial fibrillation ([employer]) 09/06/2015  . Cardiomyopathy [facility])    a. EF 25% TEE July 2013; b. EF normalized 2015;  c. 03/2015 Echo: EF 40-45%, difrf HK, PASP 38 mmHg, Mild MR, sev LAE/RAE.  [name] Chronic constipation    takes OTC stool softener  . COPD (chronic obstructive pulmonary disease) ([place])    "one dr says COPD; one dr says emphysema" (09/18/2017)  . DEPRESSION    takes Zoloft and Doxepin daily  . Diverticulitis   . DYSKINESIA, ESOPHAGUS   . Emphysema of lung [facility])    "one dr says COPD; one dr says emphysema" (09/18/2017)  . Essential hypertension       . FIBROMYALGIA   . GERD (gastroesophageal reflux disease)       . Glaucoma   . HYPERLIPIDEMIA    a. Intolerant to statins.  . INSOMNIA    takes Ambien nightly  . Myocardial infarction [facility])    a. 2012 Myoview notable for prior infarct;  b. 03/2015 Lexiscan CL: EF 37%, diff HK, small area of inferior infarct from apex to base-->Med Rx.  . Myocardial infarction (  [place])   . O2 dependent    "2.5L q hs & prn" (09/18/2017)  . Paroxysmal atrial fibrillation ([facility])    a. CHA2DS2VASc = 3--> takes Coumadin;  b. 03/15/2015 Successful TEE/DCCV;  c. 03/2015 recurrent afib, Amio d/c'd in setting of hyperthyroidism.  . Peripheral neuropathy   . Pneumonia 12/2016  . Rash and other nonspecific skin eruption 04/12/2009   no cause found saw dermatologists x 2 and allergist  . SLEEP APNEA, OBSTRUCTIVE    a. doesn't use CPAP  . Syncope    a. 03/2015 s/p MDT LINQ.  [name] Type II diabetes mellitus ([place])        Past Surgical History:  Procedure Laterality Date  . ACNE CYST REMOVAL     2 on back   .  AV NODE ABLATION N/A 10/25/2017   Procedure: AV NODE ABLATION;  Surgeon: Deboraha Sprang, MD;  Location: [place] CV [facility];  Service: Cardiovascular;  Laterality: N/A;  . BIV ICD INSERTION CRT-D N/A 09/18/2017   Procedure: BIV ICD INSERTION CRT-D;  Surgeon: Deboraha Sprang, MD;  Location: [place] CV [facility];  Service: Cardiovascular;  Laterality: N/A;  . CARDIAC CATHETERIZATION N/A 03/21/2016   Procedure: Right/Left Heart Cath and Coronary Angiography;  Surgeon: Larey Dresser, MD;  Location: [place] [facility];  Service: Cardiovascular;  Laterality: N/A;  . CARDIOVERSION  04/18/2012   Procedure: CARDIOVERSION;  Surgeon: Fay Records, MD;  Location: [place];  Service: Cardiovascular;  Laterality: N/A;  . CARDIOVERSION  04/25/2012   Procedure: CARDIOVERSION;  Surgeon: Thayer Headings, MD;  Location: [place];  Service: Cardiovascular;  Laterality: N/A;  . CARDIOVERSION  04/25/2012   Procedure: CARDIOVERSION;  Surgeon: Fay Records, MD;  Location: [place];  Service: Cardiovascular;  Laterality: N/A;  . CARDIOVERSION  05/09/2012   Procedure: CARDIOVERSION;  Surgeon: Sherren Mocha, MD;  Location: [place];  Service: Cardiovascular;  Laterality: N/A;  changed from crenshaw to cooper by trish/leone-endo  . CARDIOVERSION N/A 03/15/2015   Procedure: CARDIOVERSION;  Surgeon: Thayer Headings, MD;  Location: [facility] ENDOSCOPY;  Service: Cardiovascular;  Laterality: N/A;  . COLONOSCOPY    . COLONOSCOPY WITH PROPOFOL N/A 10/21/2014   Procedure: COLONOSCOPY WITH PROPOFOL;  Surgeon: Ladene Artist, MD;  Location: [facility] ENDOSCOPY;  Service: Endoscopy;  Laterality: N/A;  . EP IMPLANTABLE DEVICE N/A 04/06/2015   Procedure: Loop Recorder Insertion;  Surgeon: Evans Lance, MD;  Location: [place] CV LAB;  Service: Cardiovascular;  Laterality: N/A;  . ESOPHAGOGASTRODUODENOSCOPY    . JOINT REPLACEMENT    . LOOP RECORDER REMOVAL N/A 09/18/2017   Procedure: LOOP RECORDER REMOVAL;  Surgeon: Deboraha Sprang, MD;  Location: [place] [facility];  Service: Cardiovascular;  Laterality: N/A;  . RIGHT/LEFT HEART CATH AND CORONARY ANGIOGRAPHY N/A 01/28/2017   Procedure: Right/Left Heart Cath and Coronary Angiography;  Surgeon: Larey Dresser, MD;  Location: [place] CV LAB;  Service: Cardiovascular;  Laterality: N/A;  . TEE WITHOUT CARDIOVERSION  04/25/2012   Procedure: TRANSESOPHAGEAL ECHOCARDIOGRAM (TEE);  Surgeon: Thayer Headings, MD;  Location: [place];  Service: Cardiovascular;  Laterality: N/A;  . TEE WITHOUT CARDIOVERSION N/A 03/15/2015   Procedure: TRANSESOPHAGEAL ECHOCARDIOGRAM (TEE);  Surgeon: Thayer Headings, MD;  Location: [place];  Service: Cardiovascular;  Laterality: N/A;  . TONSILLECTOMY AND ADENOIDECTOMY    . TOTAL KNEE ARTHROPLASTY Right 06/15/2014   Procedure: TOTAL KNEE ARTHROPLASTY;  Surgeon: Renette Butters, MD;  Location: [place];  Service: Orthopedics;  Laterality:  Right;   Social History   Socioeconomic History  . Marital status: Divorced    Spouse name: Not on file  . Number of children: 2  . Years of education: Not on file  . Highest education level: Not on file  Occupational History  . Occupation: retired/disabled. prev worked in Therapist, sports.    Employer: DISABLED  Tobacco Use  . Smoking status: Former Smoker    Packs/day: 2.00    Years: 30.00    Pack years: 60.00    Types: Cigarettes    Quit date: 10/16/2007    Years since quitting: 12.8  . Smokeless tobacco: Never Used  Vaping Use  . Vaping Use: Never used  Substance and Sexual Activity  . Alcohol use: No  . Drug use: No  . Sexual activity: Not Currently  Other Topics Concern  . Not on file  Social History Narrative   Lives alone.   Social Determinants of Health   Financial Resource Strain:   . Difficulty of Paying Living Expenses: Not on file  Food Insecurity:   . Worried About Charity fundraiser in the Last Year: Not on file  . Ran Out of Food in the Last Year: Not on file  Transportation Needs:   .  Lack of Transportation (Medical): Not on file  . Lack of Transportation (Non-Medical): Not on file  Physical Activity:   . Days of Exercise per Week: Not on file  . Minutes of Exercise per Session: Not on file  Stress:   . Feeling of Stress : Not on file  Social Connections:   . Frequency of Communication with Friends and Family: Not on file  . Frequency of Social Gatherings with Friends and Family: Not on file  . Attends Religious Services: Not on file  . Active Member of Clubs or Organizations: Not on file  . Attends Archivist Meetings: Not on file  . Marital Status: Not on file   Allergies  Allergen Reactions  . Amiodarone Other (See Comments)    hyperthyroidism  . Statins Other (See Comments)    myalgia  . Tape Other (See Comments)    Skin Tears Use Paper Tape Only   Family History  Problem Relation Age of Onset  . COPD Mother   . Asthma Mother   . Colon polyps Mother   . Allergies Mother   . Hypothyroidism Mother   . Asthma Maternal Grandmother   . Colon cancer Neg Hx     Current Outpatient Medications (Endocrine & Metabolic):  .  predniSONE (DELTASONE) 20 MG tablet, Take 1 tablet (20 mg total) by mouth daily with breakfast.  Current Outpatient Medications (Cardiovascular):  .  carvedilol (COREG) 12.5 MG tablet, Take 1 tablet (12.5 mg total) by mouth 2 (two) times daily with a meal. .  digoxin (LANOXIN) 0.25 MG tablet, TAKE 1/2 TABLET BY MOUTH EVERY DAY .  pravastatin (PRAVACHOL) 20 MG tablet, Take 1 tablet (20 mg total) by mouth every evening. .  sacubitril-valsartan (ENTRESTO) 24-26 MG, Take 1 tablet by mouth 2 (two) times daily. [name]  spironolactone (ALDACTONE) 25 MG tablet, TAKE 1 TABLET BY MOUTH EVERY DAY .  torsemide (DEMADEX) 20 MG tablet, Take 3 tablets (60 mg total) every morning and 2 tablets (40 mg total) every evening.  Current Outpatient Medications (Respiratory):  .  albuterol (VENTOLIN HFA) 108 (90 Base) MCG/ACT inhaler, INHALE 2 PUFFS 4  TIMES A DAY as needed .  fluticasone (FLONASE) 50 MCG/ACT nasal spray, PLACE 2  SPRAYS INTO BOTH NOSTRILS DAILY AS NEEDED FOR ALLERGIES OR RHINITIS.  Current Outpatient Medications (Analgesics):  .  acetaminophen (TYLENOL) 500 MG tablet, Take 500 mg by mouth every 6 (six) hours as needed. .  traMADol (ULTRAM) 50 MG tablet, Take 1 tablet (50 mg total) by mouth daily as needed.  Current Outpatient Medications (Hematological):  .  warfarin (COUMADIN) 5 MG tablet, Take 1 tablet (5 mg total) by mouth daily.  Current Outpatient Medications (Other):  [name]  ALPRAZolam (XANAX) 0.5 MG tablet, TAKE 2 TABLETS BY MOUTH AT BEDTIME .  carisoprodol (SOMA) 350 MG tablet, TAKE 1 TABLET BY MOUTH THREE TIMES A DAY AS NEEDED FOR MUSCLE SPASMS .  doxepin (SINEQUAN) 10 MG capsule, TAKE 2 CAPSULES BY MOUTH AT BEDTIME. ANNUAL APPT DUE IN JUNE MUST SEE PROVIDER FOR FUTURE REFILLS .  hydrocortisone 2.5 % cream, APPLY TO AFFECTED AREA TWICE A DAY .  hydrOXYzine (ATARAX/VISTARIL) 10 MG tablet, TAKE 1 TABLET (10 MG TOTAL) BY MOUTH 3 (THREE) TIMES DAILY AS NEEDED FOR ITCHING. .  lidocaine (LIDODERM) 5 %, APPLY 1 PATCH TO AFFECTED AREA FOR UP TO 12 HOURS . DO NOT USE MORE THAN ONE PATCH IN 24 HOURS. .  methotrexate (RHEUMATREX) 2.5 MG tablet, TAKE 4 TABLETS BY MOUTH EVERY WEEK .  omeprazole (PRILOSEC) 20 MG capsule, TAKE 1 CAPSULE BY MOUTH TWICE A DAY .  silver sulfADIAZINE (SILVADENE) 1 % cream, APPLY TOPICALLY TO AFFECTED AREA EVERY DAY .  tamsulosin (FLOMAX) 0.4 MG CAPS capsule,  .  traZODone (DESYREL) 100 MG tablet, TAKE 1/2 TABLET BY MOUTH EVERY DAY AT BEDTIME AS NEEDED .  triamcinolone (KENALOG) 0.1 % paste, APPLY 1 APPLICATION IN THE MOUTH OR THROAT 2 (TWO) TIMES DAILY AS DIRECTED .  TUMS 500 MG chewable tablet, Chew 500-2,000 mg by mouth every 4 (four) hours as needed for indigestion or heartburn.  .  valACYclovir (VALTREX) 1000 MG tablet, TAKE 1 TABLET BY MOUTH THREE TIMES A DAY .  venlafaxine XR (EFFEXOR-XR) 37.5 MG  24 hr capsule, Take 1 capsule (37.5 mg total) by mouth daily with breakfast. .  zolpidem (AMBIEN) 10 MG tablet, TAKE 1 TABLET BY MOUTH EVERYDAY AT BEDTIME   Reviewed prior external information including notes and imaging from  primary care provider As well as notes that were available from care everywhere and other healthcare systems.  Past medical history, social, surgical and family history all reviewed in electronic medical record.  No pertanent information unless stated regarding to the chief complaint.   Review of Systems:  No headache, visual changes, nausea, vomiting, diarrhea, constipation, dizziness, abdominal pain, skin rash, fevers, chills, night sweats, weight loss, swollen lymph nodes chest pain, shortness of breath, mood changes. POSITIVE muscle aches, body aches, joint swelling  Objective  Blood pressure 122/74, pulse 76, height 5\' 10"  (1.778 m), SpO2 96 %.   General: No apparent distress alert and oriented x3 mood and affect normal, dressed appropriately.  HEENT: Pupils equal, extraocular movements intact  Respiratory: Patient's speak in full sentences and does not appear short of breath  Cardiovascular: No lower extremity edema, non tender, no erythema  Gait severely antalgic  Significant arthritic changes of multiple joints including patient's shoulders.  Low back exam does have the loss of lordosis.  Tightness noted with Corky Sox.  Patient is tender to palpation in the back as well.  Knee: Left Varus deformity noted.  Abnormal thigh to calf ratio.  Tender to palpation over medial and PF joint line.  ROM full in flexion  and extension and lower leg rotation. instability with valgus force.  painful patellar compression. Patellar glide with moderate crepitus. Patellar and quadriceps tendons unremarkable. Hamstring and quadriceps strength is normal. Contralateral knee shows knee replacement noted but tender as well  After informed written and verbal consent, patient was  seated on exam table. Left knee was prepped with alcohol swab and utilizing anterolateral approach, patient's left knee space was injected with 4:1  marcaine 0.5%: Kenalog 40mg /dL. Patient tolerated the procedure well without immediate complications.  No blood loss.  Watch patient for 5 minutes and do not see any significant swelling of the joint.  Patient felt better immediately. Impression and Recommendations:     The above documentation has been reviewed and is accurate and complete Lyndal Pulley, DO

## 2020-08-10 NOTE — Assessment & Plan Note (Signed)
We will see patient again on the ninth.  Discussed potential injections at that time again

## 2020-08-10 NOTE — Assessment & Plan Note (Signed)
Most recent imaging does show that patient does have some degenerative disc disease.  I do think that patient may need a bone density with patient being on the multiple different prednisone for quite some time.  Has had multiple pain as well.  We will discuss at follow-up.  Continue to have pain consider a CT scan of the back as well and may need to consider the possibility of epidurals but patient is on Coumadin that is not well controlled at the moment.  Patient is following up within the next 2 weeks.

## 2020-08-10 NOTE — Patient Instructions (Addendum)
Injected knee today Consider knee replacement Stop the ibuprofen with the coumadin  Prednsione 5 mg a day but if we continue this we will need to do a bone density watch for easy bruising though as well   sorry I do not have a good answer  See you on the 9th!

## 2020-08-10 NOTE — Assessment & Plan Note (Signed)
Severe at this time with instability.  I do believe that patient is going to need a knee replacement.  We will attempt an injection today secondary to the severity of pain.  Unable to do anti-inflammatories which patient has been doing even though he should not with patient being on Coumadin.  Patient's last INR was 5.4 and will be discontinuing hopefully the ibuprofen.  Patient does respond fairly well to the 5 mg of prednisone at home, with the COPD as well as chronic pain.  Patient has been in chronic pain management since 1989 multiple times but had difficulty with opiates including fentanyl and tramadol and really does not want to make any significant changes.  Discussed with him do not feel we have a lot of other options at this moment.  Patient is in agreement with trying the injections intermittently.  Understands the risk of doing the 2 frequently.  Patient knows may need to consider replacement sooner than later with patient's other chronic health problems.

## 2020-08-10 NOTE — Assessment & Plan Note (Addendum)
Last INR very high.  They are attempting to change medication if possible.  Patient knows to hold his dose today and tomorrow and will follow the guidelines given to him by his nurse.

## 2020-08-11 ENCOUNTER — Other Ambulatory Visit: Payer: Self-pay | Admitting: Internal Medicine

## 2020-08-13 ENCOUNTER — Other Ambulatory Visit: Payer: Self-pay | Admitting: Internal Medicine

## 2020-08-13 DIAGNOSIS — I255 Ischemic cardiomyopathy: Secondary | ICD-10-CM

## 2020-08-13 DIAGNOSIS — I4819 Other persistent atrial fibrillation: Secondary | ICD-10-CM

## 2020-08-13 DIAGNOSIS — I428 Other cardiomyopathies: Secondary | ICD-10-CM

## 2020-08-16 ENCOUNTER — Telehealth: Payer: Self-pay | Admitting: Pharmacist

## 2020-08-16 NOTE — Telephone Encounter (Addendum)
Called pt to follow up with pravastatin tolerability. He reports he did not tolerate therapy well and developed leg cramps on the 1/2 tablet of pravastatin 10mg  daily. He stopped the pravastatin about a week ago.  Will forward message to research team to screen pt for ORION trial since he is still interested in this.

## 2020-08-18 ENCOUNTER — Encounter: Payer: Self-pay | Admitting: Family Medicine

## 2020-08-22 NOTE — Progress Notes (Signed)
[facility] [address] [place] Phone: [phone] Subjective:   I [name] am serving as a Education administrator for Dr. Hulan Saas.  This visit occurred during the SARS-CoV-2 public health emergency.  Safety protocols were in place, including screening questions prior to the visit, additional usage of staff PPE, and extensive cleaning of exam room while observing appropriate contact time as indicated for disinfecting solutions.   I'm seeing this patient by the request  of:  [name], MD  CC: Shoulder pain follow-up  OEV:[id number]   08/10/2020 Severe at this time with instability.  I do believe that patient is going to need a knee replacement.  We will attempt an injection today secondary to the severity of pain.  Unable to do anti-inflammatories which patient has been doing even though he should not with patient being on Coumadin.  Patient's last INR was 5.4 and will be discontinuing hopefully the ibuprofen.  Patient does respond fairly well to the 5 mg of prednisone at home, with the COPD as well as chronic pain.  Patient has been in chronic pain management since 1989 multiple times but had difficulty with opiates including fentanyl and tramadol and really does not want to make any significant changes.  Discussed with him do not feel we have a lot of other options at this moment.  Patient is in agreement with trying the injections intermittently.  Understands the risk of doing the 2 frequently.  Patient knows may need to consider replacement sooner than later with patient's other chronic health problems.   Update 08/23/2020 [name] is a 64 y.o. male coming in with complaint of bilateral knee and bilateral shoulder pain. Patient here for shoulder injections. Patient states Knees are doing well. Would like prednisone. Would like shoulder injections as well.  Patient has known arthritic changes in the shoulders as well previously.  Mild to  moderate previously.  Patient did do a lot of overhead activity with his wife.  States that the chronic pain continues to give him trouble.  Still states that over the course of the years of knowing him he is in better position than what he has been previously.    Past Medical History:  Diagnosis Date  . AICD (automatic cardioverter/defibrillator) present   . Anemia    supposed to be taking Vit B but doesn't  . ANXIETY    takes Xanax nightly  . Arthritis   . Asthma    Albuterol prn and Advair daily;also takes Prednisone daily  . Atrial fibrillation ([place]) 09/06/2015  . Cardiomyopathy [facility])    a. EF 25% TEE July 2013; b. EF normalized 2015;  c. 03/2015 Echo: EF 40-45%, difrf HK, PASP 38 mmHg, Mild MR, sev LAE/RAE.  [name] Chronic constipation    takes OTC stool softener  . COPD (chronic obstructive pulmonary disease) ([place])    "one dr says COPD; one dr says emphysema" (09/18/2017)  . DEPRESSION    takes Zoloft and Doxepin daily  . Diverticulitis   . DYSKINESIA, ESOPHAGUS   . Emphysema of lung [facility])    "one dr says COPD; one dr says emphysema" (09/18/2017)  . Essential hypertension       . FIBROMYALGIA   . GERD (gastroesophageal reflux disease)       . Glaucoma   . HYPERLIPIDEMIA    a. Intolerant to statins.  . INSOMNIA    takes Ambien nightly  . Myocardial infarction [facility])    a. 2012  Myoview notable for prior infarct;  b. 03/2015 Lexiscan CL: EF 37%, diff HK, small area of inferior infarct from apex to base-->Med Rx.  . Myocardial infarction ([place])   . O2 dependent    "2.5L q hs & prn" (09/18/2017)  . Paroxysmal atrial fibrillation ([facility])    a. CHA2DS2VASc = 3--> takes Coumadin;  b. 03/15/2015 Successful TEE/DCCV;  c. 03/2015 recurrent afib, Amio d/c'd in setting of hyperthyroidism.  . Peripheral neuropathy   . Pneumonia 12/2016  . Rash and other nonspecific skin eruption 04/12/2009   no cause found saw dermatologists x 2 and allergist  . SLEEP APNEA, OBSTRUCTIVE    a. doesn't use CPAP    . Syncope    a. 03/2015 s/p MDT LINQ.  [name] Type II diabetes mellitus ([place])        Past Surgical History:  Procedure Laterality Date  . ACNE CYST REMOVAL     2 on back   . AV NODE ABLATION N/A 10/25/2017   Procedure: AV NODE ABLATION;  Surgeon: Deboraha Sprang, MD;  Location: [place] CV LAB;  Service: Cardiovascular;  Laterality: N/A;  . BIV ICD INSERTION CRT-D N/A 09/18/2017   Procedure: BIV ICD INSERTION CRT-D;  Surgeon: Deboraha Sprang, MD;  Location: [place] CV LAB;  Service: Cardiovascular;  Laterality: N/A;  . CARDIAC CATHETERIZATION N/A 03/21/2016   Procedure: Right/Left Heart Cath and Coronary Angiography;  Surgeon: Larey Dresser, MD;  Location: [place] CV LAB;  Service: Cardiovascular;  Laterality: N/A;  . CARDIOVERSION  04/18/2012   Procedure: CARDIOVERSION;  Surgeon: Fay Records, MD;  Location: [place];  Service: Cardiovascular;  Laterality: N/A;  . CARDIOVERSION  04/25/2012   Procedure: CARDIOVERSION;  Surgeon: Thayer Headings, MD;  Location: [place];  Service: Cardiovascular;  Laterality: N/A;  . CARDIOVERSION  04/25/2012   Procedure: CARDIOVERSION;  Surgeon: Fay Records, MD;  Location: [place];  Service: Cardiovascular;  Laterality: N/A;  . CARDIOVERSION  05/09/2012   Procedure: CARDIOVERSION;  Surgeon: Sherren Mocha, MD;  Location: [place];  Service: Cardiovascular;  Laterality: N/A;  changed from crenshaw to cooper by trish/leone-endo  . CARDIOVERSION N/A 03/15/2015   Procedure: CARDIOVERSION;  Surgeon: Thayer Headings, MD;  Location[facility] ENDOSCOPY;  Service: Cardiovascular;  Laterality: N/A;  . COLONOSCOPY    . COLONOSCOPY WITH PROPOFOL N/A 10/21/2014   Procedure: COLONOSCOPY WITH PROPOFOL;  Surgeon: Ladene Artist, MD;  Location: [facility] ENDOSCOPY;  Service: Endoscopy;  Laterality: N/A;  . EP IMPLANTABLE DEVICE N/A 04/06/2015   Procedure: Loop Recorder Insertion;  Surgeon: Evans Lance, MD;  Location: [place] CV LAB;  Service: Cardiovascular;  Laterality: N/A;  .  ESOPHAGOGASTRODUODENOSCOPY    . JOINT REPLACEMENT    . LOOP RECORDER REMOVAL N/A 09/18/2017   Procedure: LOOP RECORDER REMOVAL;  Surgeon: Deboraha Sprang, MD;  Location: [place] CV LAB;  Service: Cardiovascular;  Laterality: N/A;  . RIGHT/LEFT HEART CATH AND CORONARY ANGIOGRAPHY N/A 01/28/2017   Procedure: Right/Left Heart Cath and Coronary Angiography;  Surgeon: Larey Dresser, MD;  Location: [place] CV LAB;  Service: Cardiovascular;  Laterality: N/A;  . TEE WITHOUT CARDIOVERSION  04/25/2012   Procedure: TRANSESOPHAGEAL ECHOCARDIOGRAM (TEE);  Surgeon: Thayer Headings, MD;  Location: [place];  Service: Cardiovascular;  Laterality: N/A;  . TEE WITHOUT CARDIOVERSION N/A 03/15/2015   Procedure: TRANSESOPHAGEAL ECHOCARDIOGRAM (TEE);  Surgeon: Thayer Headings, MD;  Location: [place];  Service: Cardiovascular;  Laterality: N/A;  . TONSILLECTOMY AND ADENOIDECTOMY    .  TOTAL KNEE ARTHROPLASTY Right 06/15/2014   Procedure: TOTAL KNEE ARTHROPLASTY;  Surgeon: Renette Butters, MD;  Location: [place];  Service: Orthopedics;  Laterality: Right;   Social History   Socioeconomic History  . Marital status: Divorced    Spouse name: Not on file  . Number of children: 2  . Years of education: Not on file  . Highest education level: Not on file  Occupational History  . Occupation: retired/disabled. prev worked in Therapist, sports.    Employer: DISABLED  Tobacco Use  . Smoking status: Former Smoker    Packs/day: 2.00    Years: 30.00    Pack years: 60.00    Types: Cigarettes    Quit date: 10/16/2007    Years since quitting: 12.8  . Smokeless tobacco: Never Used  Vaping Use  . Vaping Use: Never used  Substance and Sexual Activity  . Alcohol use: No  . Drug use: No  . Sexual activity: Not Currently  Other Topics Concern  . Not on file  Social History Narrative   Lives alone.   Social Determinants of Health   Financial Resource Strain:   . Difficulty of Paying Living Expenses: Not on  file  Food Insecurity:   . Worried About Charity fundraiser in the Last Year: Not on file  . Ran Out of Food in the Last Year: Not on file  Transportation Needs:   . Lack of Transportation (Medical): Not on file  . Lack of Transportation (Non-Medical): Not on file  Physical Activity:   . Days of Exercise per Week: Not on file  . Minutes of Exercise per Session: Not on file  Stress:   . Feeling of Stress : Not on file  Social Connections:   . Frequency of Communication with Friends and Family: Not on file  . Frequency of Social Gatherings with Friends and Family: Not on file  . Attends Religious Services: Not on file  . Active Member of Clubs or Organizations: Not on file  . Attends Archivist Meetings: Not on file  . Marital Status: Not on file   Allergies  Allergen Reactions  . Amiodarone Other (See Comments)    hyperthyroidism  . Statins Other (See Comments)    myalgia  . Tape Other (See Comments)    Skin Tears Use Paper Tape Only   Family History  Problem Relation Age of Onset  . COPD Mother   . Asthma Mother   . Colon polyps Mother   . Allergies Mother   . Hypothyroidism Mother   . Asthma Maternal Grandmother   . Colon cancer Neg Hx     Current Outpatient Medications (Endocrine & Metabolic):  .  predniSONE (DELTASONE) 20 MG tablet, Take 1 tablet (20 mg total) by mouth daily with breakfast.  Current Outpatient Medications (Cardiovascular):  .  carvedilol (COREG) 12.5 MG tablet, Take 1 tablet (12.5 mg total) by mouth 2 (two) times daily with a meal. .  digoxin (LANOXIN) 0.25 MG tablet, TAKE 1/2 TABLET BY MOUTH EVERY DAY .  sacubitril-valsartan (ENTRESTO) 24-26 MG, Take 1 tablet by mouth 2 (two) times daily. [name]  spironolactone (ALDACTONE) 25 MG tablet, TAKE 1 TABLET BY MOUTH EVERY DAY .  torsemide (DEMADEX) 20 MG tablet, Take 3 tablets (60 mg total) every morning and 2 tablets (40 mg total) every evening.  Current Outpatient Medications (Respiratory):  .   albuterol (VENTOLIN HFA) 108 (90 Base) MCG/ACT inhaler, INHALE 2 PUFFS 4 TIMES A DAY as needed .  fluticasone (FLONASE) 50 MCG/ACT nasal spray, PLACE 2 SPRAYS INTO BOTH NOSTRILS DAILY AS NEEDED FOR ALLERGIES OR RHINITIS.  Current Outpatient Medications (Analgesics):  .  acetaminophen (TYLENOL) 500 MG tablet, Take 500 mg by mouth every 6 (six) hours as needed. .  traMADol (ULTRAM) 50 MG tablet, Take 1 tablet (50 mg total) by mouth daily as needed.  Current Outpatient Medications (Hematological):  .  warfarin (COUMADIN) 5 MG tablet, Take 1 tablet (5 mg total) by mouth daily.  Current Outpatient Medications (Other):  [name]  ALPRAZolam (XANAX) 0.5 MG tablet, TAKE 2 TABLETS BY MOUTH AT BEDTIME .  carisoprodol (SOMA) 350 MG tablet, TAKE 1 TABLET BY MOUTH THREE TIMES A DAY AS NEEDED FOR MUSCLE SPASMS .  doxepin (SINEQUAN) 10 MG capsule, TAKE 2 CAPSULES BY MOUTH AT BEDTIME. ANNUAL APPT DUE IN JUNE MUST SEE PROVIDER FOR FUTURE REFILLS .  hydrocortisone 2.5 % cream, APPLY TO AFFECTED AREA TWICE A DAY .  hydrOXYzine (ATARAX/VISTARIL) 10 MG tablet, TAKE 1 TABLET (10 MG TOTAL) BY MOUTH 3 (THREE) TIMES DAILY AS NEEDED FOR ITCHING. .  lidocaine (LIDODERM) 5 %, APPLY 1 PATCH TO AFFECTED AREA FOR UP TO 12 HOURS . DO NOT USE MORE THAN ONE PATCH IN 24 HOURS. .  methotrexate (RHEUMATREX) 2.5 MG tablet, TAKE 4 TABLETS BY MOUTH EVERY WEEK .  omeprazole (PRILOSEC) 20 MG capsule, TAKE 1 CAPSULE BY MOUTH TWICE A DAY .  silver sulfADIAZINE (SILVADENE) 1 % cream, APPLY TOPICALLY TO AFFECTED AREA EVERY DAY .  tamsulosin (FLOMAX) 0.4 MG CAPS capsule,  .  traZODone (DESYREL) 100 MG tablet, TAKE 1/2 TABLET BY MOUTH EVERY DAY AT BEDTIME AS NEEDED .  triamcinolone (KENALOG) 0.1 % paste, APPLY 1 APPLICATION IN THE MOUTH OR THROAT 2 (TWO) TIMES DAILY AS DIRECTED .  TUMS 500 MG chewable tablet, Chew 500-2,000 mg by mouth every 4 (four) hours as needed for indigestion or heartburn.  .  valACYclovir (VALTREX) 1000 MG tablet, TAKE 1  TABLET BY MOUTH THREE TIMES A DAY .  venlafaxine XR (EFFEXOR-XR) 37.5 MG 24 hr capsule, Take 1 capsule (37.5 mg total) by mouth daily with breakfast. .  zolpidem (AMBIEN) 10 MG tablet, TAKE 1 TABLET BY MOUTH EVERYDAY AT BEDTIME   Reviewed prior external information including notes and imaging from  primary care provider As well as notes that were available from care everywhere and other healthcare systems.  Past medical history, social, surgical and family history all reviewed in electronic medical record.  No pertanent information unless stated regarding to the chief complaint.   Review of Systems:  No headache, visual changes, nausea, vomiting, diarrhea, constipation, dizziness, abdominal pain, skin rash, fevers, chills, night sweats, weight loss, swollen lymph nodes,joint swelling, chest pain, shortness of breath, mood changes. POSITIVE muscle aches, body aches  Objective  Blood pressure 120/82, pulse 76, height 5\' 10"  (1.778 m), weight 253 lb (114.8 kg), SpO2 97 %.   General: No apparent distress alert and oriented x3 mood and affect normal, dressed appropriately.  HEENT: Pupils equal, extraocular movements intact  Respiratory: Patient's speak in full sentences and does not appear short of breath  Cardiovascular: No lower extremity edema, non tender, no erythema   Arthritic changes of multiple joints  Shoulder exam shows some mild atrophy of the shoulders bilaterally of the musculature.  Patient does have mild crepitus.  4 out of 5 strength of the rotator cuff but symmetric.  Positive O'Brien's bilaterally.  Positive crossover.  Patient does have an antalgic gait favoring the  left knee.  Procedure: Real-time Ultrasound Guided Injection of right glenohumeral joint Device: GE Logiq Q7  Ultrasound guided injection is preferred based studies that show increased duration, increased effect, greater accuracy, decreased procedural pain, increased response rate with ultrasound guided versus  blind injection.  Verbal informed consent obtained.  Time-out conducted.  Noted no overlying erythema, induration, or other signs of local infection.  Skin prepped in a sterile fashion.  Local anesthesia: Topical Ethyl chloride.  With sterile technique and under real time ultrasound guidance:  Joint visualized.  23g 1  inch needle inserted posterior approach. Pictures taken for needle placement. Patient did have injection of 2 cc of 1% lidocaine, 2 cc of 0.5% Marcaine, and 1.0 cc of Kenalog 40 mg/dL. Completed without difficulty  Pain immediately improved suggesting accurate placement of the medication.  Advised to call if fevers/chills, erythema, induration, drainage, or persistent bleeding.  Impression: Technically successful ultrasound guided injection.  Procedure: Real-time Ultrasound Guided Injection of left glenohumeral joint Device: GE Logiq E  Ultrasound guided injection is preferred based studies that show increased duration, increased effect, greater accuracy, decreased procedural pain, increased response rate with ultrasound guided versus blind injection.  Verbal informed consent obtained.  Time-out conducted.  Noted no overlying erythema, induration, or other signs of local infection.  Skin prepped in a sterile fashion.  Local anesthesia: Topical Ethyl chloride.  With sterile technique and under real time ultrasound guidance:  Joint visualized.  21g 2 inch needle inserted posterior approach. Pictures taken for needle placement. Patient did have injection of 2 cc of 0.5% Marcaine, and 1cc of Kenalog 40 mg/dL. Completed without difficulty  Pain immediately improved suggesting accurate placement of the medication.  Advised to call if fevers/chills, erythema, induration, drainage, or persistent bleeding.  Images permanently stored and available for review in the ultrasound unit.  Impression: Technically successful ultrasound guided injection.   Impression and Recommendations:       The above documentation has been reviewed and is accurate and complete Lyndal Pulley, DO

## 2020-08-23 ENCOUNTER — Ambulatory Visit: Payer: Self-pay

## 2020-08-23 ENCOUNTER — Ambulatory Visit (INDEPENDENT_AMBULATORY_CARE_PROVIDER_SITE_OTHER): Payer: Medicare HMO | Admitting: General Practice

## 2020-08-23 ENCOUNTER — Ambulatory Visit (INDEPENDENT_AMBULATORY_CARE_PROVIDER_SITE_OTHER): Payer: Medicare HMO

## 2020-08-23 ENCOUNTER — Other Ambulatory Visit: Payer: Self-pay

## 2020-08-23 ENCOUNTER — Ambulatory Visit: Payer: Medicare HMO | Admitting: Family Medicine

## 2020-08-23 ENCOUNTER — Encounter: Payer: Self-pay | Admitting: Family Medicine

## 2020-08-23 VITALS — BP 120/82 | HR 76 | Ht 70.0 in | Wt 253.0 lb

## 2020-08-23 DIAGNOSIS — M12812 Other specific arthropathies, not elsewhere classified, left shoulder: Secondary | ICD-10-CM

## 2020-08-23 DIAGNOSIS — M25512 Pain in left shoulder: Secondary | ICD-10-CM | POA: Diagnosis not present

## 2020-08-23 DIAGNOSIS — M12811 Other specific arthropathies, not elsewhere classified, right shoulder: Secondary | ICD-10-CM

## 2020-08-23 DIAGNOSIS — Z7901 Long term (current) use of anticoagulants: Secondary | ICD-10-CM | POA: Diagnosis not present

## 2020-08-23 DIAGNOSIS — M25511 Pain in right shoulder: Secondary | ICD-10-CM

## 2020-08-23 DIAGNOSIS — G8929 Other chronic pain: Secondary | ICD-10-CM

## 2020-08-23 LAB — POCT INR: INR: 1.5 — AB (ref 2.0–3.0)

## 2020-08-23 NOTE — Patient Instructions (Signed)
Good to see you Take Reyos every 3rd day See me again in 2 months

## 2020-08-23 NOTE — Patient Instructions (Addendum)
Pre visit review using our clinic review tool, if applicable. No additional management support is needed unless otherwise documented below in the visit note.  Take 1 tablet today and take 1 1/2 tablets tomorrow (Wednesday).  On Thursday start taking 1 tablet daily except 1/2 tablet on Sunday and Thursdays.  Re-check in 2 weeks.

## 2020-08-23 NOTE — Assessment & Plan Note (Addendum)
Bilateral injections given again today.  Tolerated the procedure well.  Patient is taking a very low dose of prednisone.  Encouraged him to talk to primary care provider on this.  Patient is continuing to stay fairly active.  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  New x-rays to further evaluate the arthropathy aspect of the shoulder.  Patient does have some underlying what appears to be chronic pain.  Patient wants to avoid any type of surgical intervention of course if possible.  Also wants to avoid any pain medicines with patient having difficulty in the past.  Follow-up with me again in 2 months patient is on blood thinner which makes medications difficult.  Patient has many other comorbidities and social determinants of health.  Patient was warned about the and constant use of the steroids.  States this is the only thing that is giving him improvement in activities of daily living.

## 2020-08-24 ENCOUNTER — Encounter: Payer: Self-pay | Admitting: Family Medicine

## 2020-09-01 ENCOUNTER — Encounter: Payer: Self-pay | Admitting: Internal Medicine

## 2020-09-01 NOTE — Telephone Encounter (Signed)
Please advise 

## 2020-09-04 ENCOUNTER — Ambulatory Visit
Admission: EM | Admit: 2020-09-04 | Discharge: 2020-09-04 | Disposition: A | Discharge status: Home / Self Care | Payer: Medicare HMO | Attending: Physician Assistant | Admitting: Physician Assistant

## 2020-09-04 ENCOUNTER — Other Ambulatory Visit: Payer: Self-pay

## 2020-09-04 ENCOUNTER — Other Ambulatory Visit: Payer: Self-pay | Admitting: Internal Medicine

## 2020-09-04 DIAGNOSIS — S81812A Laceration without foreign body, left lower leg, initial encounter: Secondary | ICD-10-CM

## 2020-09-04 DIAGNOSIS — L308 Other specified dermatitis: Secondary | ICD-10-CM

## 2020-09-04 DIAGNOSIS — T148XXA Other injury of unspecified body region, initial encounter: Secondary | ICD-10-CM

## 2020-09-04 NOTE — Discharge Instructions (Signed)
9 dermaclip and 4 steristrips placed.  You can remove current dressing in 24 hours. Keep wound clean and dry. Do not get area wet. Monitor for spreading redness, increased warmth, increased swelling, fever, follow up for reevaluation needed. Can remove dermaclip and strip gently after 10-15 days.

## 2020-09-04 NOTE — ED Triage Notes (Signed)
Pt states he fell down some stairs last night and lacerated his left leg. Pt states he is on Warfarin for CHF/AFIB issues. Pt is aox4 and ambulatory.

## 2020-09-04 NOTE — ED Provider Notes (Signed)
[facility]    CSN: [id number] Arrival date & time: 09/04/20  1107      History   Chief Complaint Chief Complaint  Patient presents with  . Fall    Left Leg Injury    HPI ELENO WEIMAR is a 64 y.o. male.   64 year old male with history of DM, HTN, HLD, A. fib on Coumadin, CAD comes in for laceration after fall last night.  States slipped on the stairs, and fell.  Denies head injury, loss of consciousness.  Has multiple abrasions due to fall, and due to Coumadin use, has had trouble controlling bleeding.  Area was cleaned and dressed with gauze/Band-Aid.  Came in for evaluation due to significant laceration to the left lower leg that has bled through the gauze.  Denies chest pain, shortness of breath, dizziness that caused the fall.     Past Medical History:  Diagnosis Date  . AICD (automatic cardioverter/defibrillator) present   . Anemia    supposed to be taking Vit B but doesn't  . ANXIETY    takes Xanax nightly  . Arthritis   . Asthma    Albuterol prn and Advair daily;also takes Prednisone daily  . Atrial fibrillation ([place]) 09/06/2015  . Cardiomyopathy [facility])    a. EF 25% TEE July 2013; b. EF normalized 2015;  c. 03/2015 Echo: EF 40-45%, difrf HK, PASP 38 mmHg, Mild MR, sev LAE/RAE.  [name] Chronic constipation    takes OTC stool softener  . COPD (chronic obstructive pulmonary disease) ([place])    "one dr says COPD; one dr says emphysema" (09/18/2017)  . DEPRESSION    takes Zoloft and Doxepin daily  . Diverticulitis   . DYSKINESIA, ESOPHAGUS   . Emphysema of lung [facility])    "one dr says COPD; one dr says emphysema" (09/18/2017)  . Essential hypertension       . FIBROMYALGIA   . GERD (gastroesophageal reflux disease)       . Glaucoma   . HYPERLIPIDEMIA    a. Intolerant to statins.  . INSOMNIA    takes Ambien nightly  . Myocardial infarction [facility])    a. 2012 Myoview notable for prior infarct;  b. 03/2015 Lexiscan CL: EF 37%, diff HK, small area of inferior  infarct from apex to base-->Med Rx.  . Myocardial infarction ([place])   . O2 dependent    "2.5L q hs & prn" (09/18/2017)  . Paroxysmal atrial fibrillation (HCC)    a. CHA2DS2VASc = 3--> takes Coumadin;  b. 03/15/2015 Successful TEE/DCCV;  c. 03/2015 recurrent afib, Amio d/c'd in setting of hyperthyroidism.  . Peripheral neuropathy   . Pneumonia 12/2016  . Rash and other nonspecific skin eruption 04/12/2009   no cause found saw dermatologists x 2 and allergist  . SLEEP APNEA, OBSTRUCTIVE    a. doesn't use CPAP  . Syncope    a. 03/2015 s/p MDT LINQ.  [name] Type II diabetes mellitus [facility])         Patient Active Problem List   Diagnosis Date Noted  . Statin myopathy 08/02/2020  . Rotator cuff arthropathy of both shoulders 02/23/2020  . (HFpEF) heart failure with preserved ejection fraction ([place]) 02/22/2020  . ICD (implantable cardioverter-defibrillator) in place 12/27/2019  . COVID-19 virus infection 12/09/2019  . COVID-19 12/09/2019  . Hypotension 12/09/2019  . Upper respiratory infection 11/27/2019  . COPD exacerbation ([place]) 11/27/2019  . Elevated PSA 09/23/2019  . Lip lesion 12/23/2018  . Diabetic ulcer of left lower leg with necrosis of  muscle ([place]) 12/05/2018  . Chronic bursitis of left shoulder 07/01/2018  . Pain due to total knee replacement ([place]) 03/31/2018  . Rotator cuff arthropathy, left 02/13/2018  . Right rotator cuff tear arthropathy 01/14/2018  . Increased prostate specific antigen (PSA) velocity 09/26/2017  . Trigger point of thoracic region 07/04/2017  . Right lumbar radiculopathy 05/15/2017  . Right leg swelling 05/09/2017  . Glaucoma 04/18/2017  . History of nasal polyposis 02/19/2017  . Chronic rhinitis 02/19/2017  . Microhematuria 12/04/2016  . Preventative health care 11/29/2015  . Type II diabetes mellitus ([place])   . Hyperlipidemia   . Persistent atrial fibrillation ([place]) 03/30/2015  . Syncope 03/30/2015  . Anemia 02/23/2015  . Thyrotoxicosis 02/23/2015  . Hx  of adenomatous colonic polyps 10/21/2014  . De Quervain's tenosynovitis, right 04/30/2014  . Osteoarthritis of right knee 02/19/2014  . Encounter for therapeutic drug monitoring 11/17/2013  . Thoracic degenerative disc disease 06/17/2013  . Thoracic spondylosis without myelopathy 02/11/2013  . Lumbosacral spondylosis without myelopathy 02/11/2013  . Degenerative joint disease of knee, left 02/11/2013  . COPD (chronic obstructive pulmonary disease) ([place]) 08/20/2012  . Bundle branch block, right and extreme left anterior fascicular 05/05/2012  . Nonischemic cardiomyopathy ([place]) 05/02/2012  . Anticoagulated on warfarin 03/21/2012  . Spongiotic dermatitis 09/26/2011  . Past myocardial infarction 04/19/2011  . UNSPECIFIED URETHRAL STRICTURE 12/26/2010  . Chronic pain syndrome 05/18/2010  . Obstructive sleep apnea 10/21/2007  . NEPHROLITHIASIS, HX OF 10/21/2007  . Anxiety 06/09/2007  . GERD 06/09/2007  . BENIGN PROSTATIC HYPERTROPHY 06/09/2007  . Morbid obesity ([place]) 06/03/2007  . Depression 06/03/2007  . Essential hypertension 06/03/2007  . INSOMNIA 06/03/2007    Past Surgical History:  Procedure Laterality Date  . ACNE CYST REMOVAL     2 on back   . AV NODE ABLATION N/A 10/25/2017   Procedure: AV NODE ABLATION;  Surgeon: Deboraha Sprang, MD;  Location: [place] [facility];  Service: Cardiovascular;  Laterality: N/A;  . BIV ICD INSERTION CRT-D N/A 09/18/2017   Procedure: BIV ICD INSERTION CRT-D;  Surgeon: Deboraha Sprang, MD;  Location: [place] [facility];  Service: Cardiovascular;  Laterality: N/A;  . CARDIAC CATHETERIZATION N/A 03/21/2016   Procedure: Right/Left Heart Cath and Coronary Angiography;  Surgeon: Larey Dresser, MD;  Location: [place] [facility];  Service: Cardiovascular;  Laterality: N/A;  . CARDIOVERSION  04/18/2012   Procedure: CARDIOVERSION;  Surgeon: Fay Records, MD;  Location: [place];  Service: Cardiovascular;  Laterality: N/A;  . CARDIOVERSION  04/25/2012   Procedure:  CARDIOVERSION;  Surgeon: Thayer Headings, MD;  Location: [place];  Service: Cardiovascular;  Laterality: N/A;  . CARDIOVERSION  04/25/2012   Procedure: CARDIOVERSION;  Surgeon: Fay Records, MD;  Location: [place];  Service: Cardiovascular;  Laterality: N/A;  . CARDIOVERSION  05/09/2012   Procedure: CARDIOVERSION;  Surgeon: Sherren Mocha, MD;  Location: [place];  Service: Cardiovascular;  Laterality: N/A;  changed from crenshaw to cooper by trish/leone-endo  . CARDIOVERSION N/A 03/15/2015   Procedure: CARDIOVERSION;  Surgeon: Thayer Headings, MD;  Location: [facility];  Service: Cardiovascular;  Laterality: N/A;  . COLONOSCOPY    . COLONOSCOPY WITH PROPOFOL N/A 10/21/2014   Procedure: COLONOSCOPY WITH PROPOFOL;  Surgeon: Ladene Artist, MD;  Location: [facility] ENDOSCOPY;  Service: Endoscopy;  Laterality: N/A;  . EP IMPLANTABLE DEVICE N/A 04/06/2015   Procedure: Loop Recorder Insertion;  Surgeon: Evans Lance, MD;  Location: [place] [facility];  Service: Cardiovascular;  Laterality: N/A;  . ESOPHAGOGASTRODUODENOSCOPY    . JOINT REPLACEMENT    . LOOP RECORDER REMOVAL N/A 09/18/2017   Procedure: LOOP RECORDER REMOVAL;  Surgeon: Deboraha Sprang, MD;  Location: [place] [facility];  Service: Cardiovascular;  Laterality: N/A;  . RIGHT/LEFT HEART CATH AND CORONARY ANGIOGRAPHY N/A 01/28/2017   Procedure: Right/Left Heart Cath and Coronary Angiography;  Surgeon: Larey Dresser, MD;  Location: [place] [facility];  Service: Cardiovascular;  Laterality: N/A;  . TEE WITHOUT CARDIOVERSION  04/25/2012   Procedure: TRANSESOPHAGEAL ECHOCARDIOGRAM (TEE);  Surgeon: Thayer Headings, MD;  Location: [place];  Service: Cardiovascular;  Laterality: N/A;  . TEE WITHOUT CARDIOVERSION N/A 03/15/2015   Procedure: TRANSESOPHAGEAL ECHOCARDIOGRAM (TEE);  Surgeon: Thayer Headings, MD;  Location: [place];  Service: Cardiovascular;  Laterality: N/A;  . TONSILLECTOMY AND ADENOIDECTOMY    . TOTAL KNEE ARTHROPLASTY Right  06/15/2014   Procedure: TOTAL KNEE ARTHROPLASTY;  Surgeon: Renette Butters, MD;  Location: [place];  Service: Orthopedics;  Laterality: Right;       Home Medications    Prior to Admission medications   Medication Sig Start Date End Date Taking? Authorizing Provider  ALPRAZolam Duanne Moron) 0.5 MG tablet TAKE 2 TABLETS BY MOUTH AT BEDTIME 07/26/20  Yes [name], MD  omeprazole (PRILOSEC) 20 MG capsule TAKE 1 CAPSULE BY MOUTH TWICE A DAY 04/26/20  Yes [name], MD  spironolactone (ALDACTONE) 25 MG tablet TAKE 1 TABLET BY MOUTH EVERY DAY 04/26/20  Yes Larey Dresser, MD  warfarin (COUMADIN) 5 MG tablet Take 1 tablet (5 mg total) by mouth daily. 07/28/20  Yes Larey Dresser, MD  zolpidem (AMBIEN) 10 MG tablet TAKE 1 TABLET BY MOUTH EVERYDAY AT BEDTIME 03/16/20  Yes Janith Lima, MD  acetaminophen (TYLENOL) 500 MG tablet Take 500 mg by mouth every 6 (six) hours as needed.    [provider]  albuterol (VENTOLIN HFA) 108 (90 Base) MCG/ACT inhaler INHALE 2 PUFFS 4 TIMES A DAY as needed 11/27/19   [name], MD  carisoprodol (SOMA) 350 MG tablet TAKE 1 TABLET BY MOUTH THREE TIMES A DAY AS NEEDED FOR MUSCLE SPASMS 08/14/20   [name], MD  carvedilol (COREG) 12.5 MG tablet Take 1 tablet (12.5 mg total) by mouth 2 (two) times daily with a meal. 06/30/20   Larey Dresser, MD  digoxin (LANOXIN) 0.25 MG tablet TAKE 1/2 TABLET BY MOUTH EVERY DAY 11/25/19   Larey Dresser, MD  doxepin (SINEQUAN) 10 MG capsule TAKE 2 CAPSULES BY MOUTH AT BEDTIME. ANNUAL APPT DUE IN JUNE MUST SEE PROVIDER FOR FUTURE REFILLS 09/04/20   [name], MD  fluticasone [facility]) 50 MCG/ACT nasal spray PLACE 2 SPRAYS INTO BOTH NOSTRILS DAILY AS NEEDED FOR ALLERGIES OR RHINITIS. 04/12/20   [name], MD  hydrocortisone 2.5 % cream APPLY TO AFFECTED AREA TWICE A DAY 04/25/20   [name], MD  hydrOXYzine (ATARAX/VISTARIL) 10 MG tablet TAKE 1 TABLET (10 MG TOTAL) BY MOUTH 3 (THREE) TIMES DAILY AS NEEDED  FOR ITCHING. 04/26/20   [name], MD  lidocaine (LIDODERM) 5 % APPLY 1 PATCH TO AFFECTED AREA FOR UP TO 12 HOURS . DO NOT USE MORE THAN ONE PATCH IN 24 HOURS. 12/25/19   [name], MD  methotrexate (RHEUMATREX) 2.5 MG tablet TAKE 4 TABLETS BY MOUTH EVERY WEEK 11/10/18   [name], MD  sacubitril-valsartan (ENTRESTO) 24-26 MG Take 1 tablet by mouth  2 (two) times daily. 07/04/20   Larey Dresser, MD  tamsulosin [facility]) 0.4 MG CAPS capsule  03/11/20   [provider]  torsemide (DEMADEX) 20 MG tablet Take 3 tablets (60 mg total) every morning and 2 tablets (40 mg total) every evening. 04/13/20   Larey Dresser, MD  traMADol (ULTRAM) 50 MG tablet Take 1 tablet (50 mg total) by mouth daily as needed. 02/25/20   [name], MD  traZODone (DESYREL) 100 MG tablet TAKE 1/2 TABLET BY MOUTH EVERY DAY AT BEDTIME AS NEEDED 05/19/20   [name], MD  triamcinolone (KENALOG) 0.1 % paste APPLY 1 APPLICATION IN THE MOUTH OR THROAT 2 (TWO) TIMES DAILY AS DIRECTED 08/11/20   [name], MD  TUMS 500 MG chewable tablet Chew 500-2,000 mg by mouth every 4 (four) hours as needed for indigestion or heartburn.  10/19/16   [provider]  valACYclovir (VALTREX) 1000 MG tablet TAKE 1 TABLET BY MOUTH THREE TIMES A DAY 05/02/20   [name], MD  venlafaxine XR (EFFEXOR-XR) 37.5 MG 24 hr capsule Take 1 capsule (37.5 mg total) by mouth daily with breakfast. 04/07/20   Lyndal Pulley, DO    Family History Family History  Problem Relation Age of Onset  . COPD Mother   . Asthma Mother   . Colon polyps Mother   . Allergies Mother   . Hypothyroidism Mother   . Asthma Maternal Grandmother   . Colon cancer Neg Hx     Social History Social History   Tobacco Use  . Smoking status: Former Smoker    Packs/day: 2.00    Years: 30.00    Pack years: 60.00    Types: Cigarettes    Quit date: 10/16/2007    Years since quitting: 12.8  . Smokeless tobacco: Never Used  Vaping Use  . Vaping  Use: Never used  Substance Use Topics  . Alcohol use: No  . Drug use: No     Allergies   Amiodarone, Statins, and Tape   Review of Systems Review of Systems  Reason unable to perform ROS: See HPI as above.     Physical Exam Triage Vital Signs ED Triage Vitals  Enc Vitals Group     BP 09/04/20 1139 107/69     Pulse Rate 09/04/20 1139 79     Resp 09/04/20 1139 20     Temp 09/04/20 1139 98.1 F (36.7 C)     Temp Source 09/04/20 1139 Oral     SpO2 09/04/20 1139 96 %     Weight --      Height --      Head Circumference --      Peak Flow --      Pain Score 09/04/20 1200 2     Pain Loc --      Pain Edu? --      Excl. in [place]? --    No data found.  Updated Vital Signs BP 107/69 (BP Location: Left Arm)   Pulse 79   Temp 98.1 F (36.7 C) (Oral)   Resp 20   SpO2 96%   Physical Exam Constitutional:      General: He is not in acute distress.    Appearance: Normal appearance. He is well-developed. He is not toxic-appearing or diaphoretic.  HENT:     Head: Normocephalic and atraumatic.  Eyes:     Conjunctiva/sclera: Conjunctivae normal.     Pupils: Pupils are equal, round, and reactive to  light.  Pulmonary:     Effort: Pulmonary effort is normal. No respiratory distress.  Musculoskeletal:     Cervical back: Normal range of motion and neck supple.     Comments: 13cm skin tear with surrounding contusion/hematoma to the left lower leg. Bleeding controlled without pressure.  Multiple abrasions to BUE/BLE. Bleeding controlled.   Skin:    General: Skin is warm and dry.  Neurological:     Mental Status: He is alert and oriented to person, place, and time.      UC Treatments / Results  Labs (all labs ordered are listed, but only abnormal results are displayed) Labs Reviewed - No data to display  EKG   Radiology No results found.  Procedures Laceration Repair  Date/Time: 09/04/2020 6:56 PM Performed by: Ok Edwards, PA-C Authorized by: Ok Edwards, PA-C    Consent:    Consent obtained:  Verbal   Consent given by:  Patient   Risks discussed:  Infection, pain, poor cosmetic result and poor wound healing   Alternatives discussed:  No treatment Anesthesia (see MAR for exact dosages):    Anesthesia method:  Topical application   Topical anesthetic:  LET Laceration details:    Location:  Leg   Leg location:  L lower leg   Length (cm):  13   Depth (mm):  2 Repair type:    Repair type:  Simple Pre-procedure details:    Preparation:  Patient was prepped and draped in usual sterile fashion Exploration:    Hemostasis achieved with:  LET and direct pressure   Wound exploration: entire depth of wound probed and visualized     Wound extent: no fascia violation noted and no muscle damage noted     Contaminated: no   Treatment:    Area cleansed with:  Saline   Amount of cleaning:  Standard   Irrigation solution:  Sterile water   Irrigation method:  Pressure wash   Visualized foreign bodies/material removed: no   Skin repair:    Repair method: 9 dermaclip, 4 steristrip. Approximation:    Approximation:  Close Post-procedure details:    Dressing:  Bulky dressing   Patient tolerance of procedure:  Tolerated well, no immediate complications   (including critical care time)  Medications Ordered in UC Medications - No data to display  Initial Impression / Assessment and Plan / UC Course  I have reviewed the triage vital signs and the nursing notes.  Pertinent labs & imaging results that were available during my care of the patient were reviewed by me and considered in my medical decision making (see chart for details).    Patient tolerated procedure well. Wound approximated with 9 dermaclip, 4 steristrips. Other abrasions dressed by RN. Wound care instructions given. Return precautions given.  Final Clinical Impressions(s) / UC Diagnoses   Final diagnoses:  Laceration of left lower extremity, initial encounter  Abrasion    ED  Prescriptions    None     PDMP not reviewed this encounter.   Ok Edwards, PA-C 09/04/20 1859

## 2020-09-05 ENCOUNTER — Ambulatory Visit (INDEPENDENT_AMBULATORY_CARE_PROVIDER_SITE_OTHER): Payer: Medicare HMO

## 2020-09-05 DIAGNOSIS — Z95810 Presence of automatic (implantable) cardiac defibrillator: Secondary | ICD-10-CM

## 2020-09-05 DIAGNOSIS — I5022 Chronic systolic (congestive) heart failure: Secondary | ICD-10-CM

## 2020-09-06 ENCOUNTER — Telehealth: Payer: Self-pay

## 2020-09-06 ENCOUNTER — Ambulatory Visit: Payer: Medicare HMO

## 2020-09-06 NOTE — Telephone Encounter (Signed)
Remote ICM transmission received.  Attempted call to patient regarding ICM remote transmission and left detailed message per DPR to return call.  Advised to return call for any fluid symptoms or questions.      

## 2020-09-06 NOTE — Progress Notes (Signed)
EPIC Encounter for ICM Monitoring  Patient Name: [name] is a 64 y.o. male Date: 09/06/2020 Primary Care Physican: [name], MD Primary Cardiologist:McLean Electrophysiologist:Klein Bi-V Pacing: 100% 06/28/2020 Weight: 237lbs  Attempted call to patient and unable to reach.  Left detailed message per DPR regarding transmission. Transmission reviewed.   Optivol thoracic impedancesuggesting possible fluid accumulation for last month with exception of a couple of days at baseline.  Prescribed:  Torsemide 20 mg to3tablets (60 mg total)and 2 tablets (40 mg total) every evening.  Spironolactone 25 mg take 1 tablet daily.  Labs: 07/14/2020 Creatinine 1.19, BUN 22, Potassium 4.7, Sodium 134, GFR >60 04/22/2020 Creatinine 1.15, BUN 34, Potassium 3.7, Sodium 139, GFR >60 03/11/2020 Creatinine 0.80, BUN 15, Potassium 4.8, Sodium 139, GFR >60 12/22/2019 Creatinine 1.08, BUN 24, Potassium 4.0, Sodium 139, GFR >60 A complete set of results can be found in Results Review.  Recommendations: Will advise to to take Furosemide 60 mg bid x 2 days if patient is reached.    Left voice mail with ICM number and encouraged to call if experiencing any fluid symptoms.   Follow-up plan: ICM clinic phone appointment on11/29/2021 (manual) to recheck fluid levels. 91 day device clinic remote transmission12/13/2021.  EP/Cardiology Office Visits:10/10/2020 with [facility].  Copy of ICM check sent to Dr.Klein and Dr [name] for review and recommendations if needed.  3 month ICM trend: 09/05/2020    1 Year ICM trend:      [name], RN 09/06/2020 2:34 PM

## 2020-09-06 NOTE — Progress Notes (Signed)
Spoke with patient and he reports feeling fine with the exception he has some injuries from a fall.  He reports eating a lot of fast food and discussed this type of food is high in salt which may be contributing to fluid accumulation.  He will try to change diet to lower salt foods.    Advised to take extra Torsemide x 2 days and then return to prescribed dosage.  He will send report on 09/12/2020.

## 2020-09-07 ENCOUNTER — Other Ambulatory Visit: Payer: Self-pay | Admitting: Internal Medicine

## 2020-09-07 ENCOUNTER — Encounter: Payer: Self-pay | Admitting: Internal Medicine

## 2020-09-07 ENCOUNTER — Telehealth: Payer: Self-pay | Admitting: Internal Medicine

## 2020-09-07 MED ORDER — TRAZODONE HCL 100 MG PO TABS
ORAL_TABLET | ORAL | 1 refills | Status: DC
Start: 2020-09-07 — End: 2020-09-07

## 2020-09-07 MED ORDER — TRAZODONE HCL 100 MG PO TABS
ORAL_TABLET | ORAL | 1 refills | Status: DC
Start: 2020-09-07 — End: 2020-09-12

## 2020-09-07 NOTE — Telephone Encounter (Signed)
Patient unsure why his trazadone 50 mg was denied and would like to talk to someone about that.  Patient # [phone]

## 2020-09-09 ENCOUNTER — Other Ambulatory Visit: Payer: Self-pay | Admitting: Internal Medicine

## 2020-09-10 ENCOUNTER — Other Ambulatory Visit (HOSPITAL_COMMUNITY): Payer: Self-pay | Admitting: Cardiology

## 2020-09-10 ENCOUNTER — Other Ambulatory Visit: Payer: Self-pay | Admitting: Internal Medicine

## 2020-09-10 DIAGNOSIS — Z8709 Personal history of other diseases of the respiratory system: Secondary | ICD-10-CM

## 2020-09-10 DIAGNOSIS — J3089 Other allergic rhinitis: Secondary | ICD-10-CM

## 2020-09-12 ENCOUNTER — Other Ambulatory Visit: Payer: Self-pay | Admitting: Internal Medicine

## 2020-09-12 ENCOUNTER — Ambulatory Visit (INDEPENDENT_AMBULATORY_CARE_PROVIDER_SITE_OTHER): Payer: Medicare HMO

## 2020-09-12 ENCOUNTER — Other Ambulatory Visit: Payer: Self-pay | Admitting: Cardiology

## 2020-09-12 ENCOUNTER — Encounter: Payer: Self-pay | Admitting: Internal Medicine

## 2020-09-12 DIAGNOSIS — I5022 Chronic systolic (congestive) heart failure: Secondary | ICD-10-CM

## 2020-09-12 DIAGNOSIS — Z95810 Presence of automatic (implantable) cardiac defibrillator: Secondary | ICD-10-CM

## 2020-09-12 MED ORDER — TRAZODONE HCL 100 MG PO TABS
ORAL_TABLET | ORAL | 1 refills | Status: DC
Start: 2020-09-12 — End: 2020-09-12

## 2020-09-12 MED ORDER — TRAZODONE HCL 100 MG PO TABS
ORAL_TABLET | ORAL | 1 refills | Status: DC
Start: 2020-09-12 — End: 2020-09-16

## 2020-09-12 NOTE — Addendum Note (Signed)
Addended by: [name] on: 09/12/2020 01:26 PM   Modules accepted: Orders

## 2020-09-13 ENCOUNTER — Ambulatory Visit: Payer: Medicare HMO

## 2020-09-13 ENCOUNTER — Telehealth: Payer: Self-pay | Admitting: Family Medicine

## 2020-09-13 ENCOUNTER — Other Ambulatory Visit: Payer: Self-pay | Admitting: Internal Medicine

## 2020-09-13 ENCOUNTER — Ambulatory Visit (INDEPENDENT_AMBULATORY_CARE_PROVIDER_SITE_OTHER): Payer: Medicare HMO | Admitting: General Practice

## 2020-09-13 ENCOUNTER — Other Ambulatory Visit: Payer: Self-pay

## 2020-09-13 DIAGNOSIS — Z7901 Long term (current) use of anticoagulants: Secondary | ICD-10-CM | POA: Diagnosis not present

## 2020-09-13 LAB — POCT INR: INR: 4.7 — AB (ref 2.0–3.0)

## 2020-09-13 NOTE — Progress Notes (Signed)
EPIC Encounter for ICM Monitoring  Patient Name: [name] is a 64 y.o. male Date: 09/13/2020 Primary Care Physican: [name], MD Primary Cardiologist:McLean Electrophysiologist:Klein Bi-V Pacing:100% 09/13/2020 Weight: 252lbs   Spoke with patient and reports feeling well at this time.  Denies fluid symptoms.    Optivol thoracic impedancesuggesting possible fluid accumulation for last month with exception of a couple of days at baseline.  Prescribed:  Torsemide 20 mg to3tablets (60 mg total)and 2 tablets (40 mg total) every evening.  Spironolactone 25 mg take 1 tablet daily.  Labs: 07/14/2020 Creatinine 1.19, BUN 22, Potassium 4.7, Sodium 134, GFR >60 04/22/2020 Creatinine 1.15, BUN 34, Potassium 3.7, Sodium 139, GFR >60 03/11/2020 Creatinine 0.80, BUN 15, Potassium 4.8, Sodium 139, GFR >60 12/22/2019 Creatinine 1.08, BUN 24, Potassium 4.0, Sodium 139, GFR >60 A complete set of results can be found in Results Review.  Recommendations:Advised to take Furosemide 60 mg bid x 2 days and then return to taking prescribed dosage of 60mg /40mg .      Follow-up plan: ICM clinic phone appointment on12/13/2021 to recheck fluid levels. 91 day device clinic remote transmission12/13/2021.  EP/Cardiology Office Visits:10/10/2020 with Dr [name].  Copy of ICM check sent to [place].   3 month ICM trend: 09/11/2020    1 Year ICM trend:       [name], RN 09/13/2020 4:28 PM

## 2020-09-13 NOTE — Patient Instructions (Addendum)
Pre visit review using our clinic review tool, if applicable. No additional management support is needed unless otherwise documented below in the visit note.  Hold coumadin today and tomorrow.  Take 1/2 tablet Thurs, Fri, Sat and Sun.  On Monday 12/6, 7 and 8th take 1 tablet.  Patient is taking a round of doxycycline due to a left lower leg injury and should finish Friday, 12/3.  Re-check on 12/9. Patient will also see Dr. [name] on 12/9.

## 2020-09-13 NOTE — Progress Notes (Signed)
Medical screening examination/treatment/procedure(s) were performed by non-physician practitioner and as supervising physician I was immediately available for consultation/collaboration. I agree with above. Kymberley Raz, MD   

## 2020-09-14 ENCOUNTER — Other Ambulatory Visit: Payer: Self-pay

## 2020-09-14 MED ORDER — VENLAFAXINE HCL ER 75 MG PO CP24: 75.0000 mg | ORAL_CAPSULE | Freq: Every day | ORAL | 0 refills | Status: DC

## 2020-09-14 NOTE — Telephone Encounter (Signed)
Spoke with patient. Med refilled per a verbal from Dr. Tamala Julian for 30 tabs of the 75mg  Effexor.

## 2020-09-14 NOTE — Telephone Encounter (Signed)
Patient called asking why his medication was denied.  Please advise.

## 2020-09-14 NOTE — Progress Notes (Unsigned)
effe

## 2020-09-16 ENCOUNTER — Other Ambulatory Visit: Payer: Self-pay

## 2020-09-16 ENCOUNTER — Ambulatory Visit (INDEPENDENT_AMBULATORY_CARE_PROVIDER_SITE_OTHER): Payer: Medicare HMO | Admitting: Internal Medicine

## 2020-09-16 VITALS — BP 120/80 | HR 72 | Temp 98.3°F | Ht 70.0 in | Wt 254.0 lb

## 2020-09-16 DIAGNOSIS — Z7901 Long term (current) use of anticoagulants: Secondary | ICD-10-CM | POA: Diagnosis not present

## 2020-09-16 DIAGNOSIS — R972 Elevated prostate specific antigen [PSA]: Secondary | ICD-10-CM | POA: Diagnosis not present

## 2020-09-16 DIAGNOSIS — S81812D Laceration without foreign body, left lower leg, subsequent encounter: Secondary | ICD-10-CM | POA: Diagnosis not present

## 2020-09-16 DIAGNOSIS — E1165 Type 2 diabetes mellitus with hyperglycemia: Secondary | ICD-10-CM

## 2020-09-16 DIAGNOSIS — I10 Essential (primary) hypertension: Secondary | ICD-10-CM

## 2020-09-16 LAB — HEPATIC FUNCTION PANEL
ALT: 33 U/L (ref 0–53)
AST: 20 U/L (ref 0–37)
Albumin: 4.2 g/dL (ref 3.5–5.2)
Alkaline Phosphatase: 64 U/L (ref 39–117)
Bilirubin, Direct: 0.1 mg/dL (ref 0.0–0.3)
Total Bilirubin: 0.5 mg/dL (ref 0.2–1.2)
Total Protein: 6.3 g/dL (ref 6.0–8.3)

## 2020-09-16 LAB — LIPID PANEL
Cholesterol: 220 mg/dL — ABNORMAL HIGH (ref 0–200)
HDL: 37.9 mg/dL — ABNORMAL LOW (ref >39.00)
NonHDL: 182.38
Total CHOL/HDL Ratio: 6
Triglycerides: 239 mg/dL — ABNORMAL HIGH (ref 0.0–149.0)
VLDL: 47.8 mg/dL — ABNORMAL HIGH (ref 0.0–40.0)

## 2020-09-16 LAB — LDL CHOLESTEROL, DIRECT: Direct LDL: 129 mg/dL

## 2020-09-16 LAB — CBC WITH DIFFERENTIAL/PLATELET
Basophils Absolute: 0 10*3/uL (ref 0.0–0.1)
Basophils Relative: 0.6 % (ref 0.0–3.0)
Eosinophils Absolute: 0.2 10*3/uL (ref 0.0–0.7)
Eosinophils Relative: 1.9 % (ref 0.0–5.0)
HCT: 37.8 % — ABNORMAL LOW (ref 39.0–52.0)
Hemoglobin: 12.9 g/dL — ABNORMAL LOW (ref 13.0–17.0)
Lymphocytes Relative: 20.6 % (ref 12.0–46.0)
Lymphs Abs: 1.7 10*3/uL (ref 0.7–4.0)
MCHC: 34 g/dL (ref 30.0–36.0)
MCV: 107 fl — ABNORMAL HIGH (ref 78.0–100.0)
Monocytes Absolute: 0.8 10*3/uL (ref 0.1–1.0)
Monocytes Relative: 9.9 % (ref 3.0–12.0)
Neutro Abs: 5.5 10*3/uL (ref 1.4–7.7)
Neutrophils Relative %: 67 % (ref 43.0–77.0)
Platelets: 224 10*3/uL (ref 150.0–400.0)
RBC: 3.53 Mil/uL — ABNORMAL LOW (ref 4.22–5.81)
RDW: 16.3 % — ABNORMAL HIGH (ref 11.5–15.5)
WBC: 8.1 10*3/uL (ref 4.0–10.5)

## 2020-09-16 LAB — BASIC METABOLIC PANEL
BUN: 29 mg/dL — ABNORMAL HIGH (ref 6–23)
CO2: 26 mEq/L (ref 19–32)
Calcium: 9.1 mg/dL (ref 8.4–10.5)
Chloride: 107 mEq/L (ref 96–112)
Creatinine, Ser: 0.98 mg/dL (ref 0.40–1.50)
GFR: 80.78 mL/min (ref >60.00)
Glucose, Bld: 82 mg/dL (ref 70–99)
Potassium: 3.9 mEq/L (ref 3.5–5.1)
Sodium: 142 mEq/L (ref 135–145)

## 2020-09-16 LAB — PSA: PSA: 3.24 ng/mL (ref 0.10–4.00)

## 2020-09-16 LAB — HEMOGLOBIN A1C: Hgb A1c MFr Bld: 5.6 % (ref 4.6–6.5)

## 2020-09-16 MED ORDER — TRAZODONE HCL 100 MG PO TABS
ORAL_TABLET | ORAL | 1 refills | Status: DC
Start: 2020-09-16 — End: 2021-05-15

## 2020-09-16 MED ORDER — ZOLPIDEM TARTRATE 10 MG PO TABS
ORAL_TABLET | ORAL | 1 refills | Status: DC
Start: 2020-09-16 — End: 2021-03-03

## 2020-09-16 NOTE — Progress Notes (Signed)
Subjective:    Patient ID: [name], male    DOB: 03/16/1956, 64 y.o.   MRN: [id number]  HPI  Here to f/u; overall doing ok,  Pt denies chest pain, increasing sob or doe, wheezing, orthopnea, PND, increased LE swelling, palpitations, dizziness or syncope.  Pt denies new neurological symptoms such as new headache, or facial or extremity weakness or numbness.  Pt denies polydipsia, polyuria, or low sugar episode.  Pt states overall good compliance with meds, mostly trying to follow appropriate diet, with wt overall stable,  but little exercise however. Also needs left leg laceration strips removed , has several days of doxycycline left. Denies fever, redness, swelling or increased drainage.  Past Medical History:  Diagnosis Date  . AICD (automatic cardioverter/defibrillator) present   . Anemia    supposed to be taking Vit B but doesn't  . ANXIETY    takes Xanax nightly  . Arthritis   . Asthma    Albuterol prn and Advair daily;also takes Prednisone daily  . Atrial fibrillation ([place]) 09/06/2015  . Cardiomyopathy [facility])    a. EF 25% TEE July 2013; b. EF normalized 2015;  c. 03/2015 Echo: EF 40-45%, difrf HK, PASP 38 mmHg, Mild MR, sev LAE/RAE.  [name] Chronic constipation    takes OTC stool softener  . COPD (chronic obstructive pulmonary disease) ([place])    "one dr says COPD; one dr says emphysema" (09/18/2017)  . DEPRESSION    takes Zoloft and Doxepin daily  . Diverticulitis   . DYSKINESIA, ESOPHAGUS   . Emphysema of lung [facility])    "one dr says COPD; one dr says emphysema" (09/18/2017)  . Essential hypertension       . FIBROMYALGIA   . GERD (gastroesophageal reflux disease)       . Glaucoma   . HYPERLIPIDEMIA    a. Intolerant to statins.  . INSOMNIA    takes Ambien nightly  . Myocardial infarction [facility])    a. 2012 Myoview notable for prior infarct;  b. 03/2015 Lexiscan CL: EF 37%, diff HK, small area of inferior infarct from apex to base-->Med Rx.  . Myocardial infarction ([place])   . O2  dependent    "2.5L q hs & prn" (09/18/2017)  . Paroxysmal atrial fibrillation ([facility])    a. CHA2DS2VASc = 3--> takes Coumadin;  b. 03/15/2015 Successful TEE/DCCV;  c. 03/2015 recurrent afib, Amio d/c'd in setting of hyperthyroidism.  . Peripheral neuropathy   . Pneumonia 12/2016  . Rash and other nonspecific skin eruption 04/12/2009   no cause found saw dermatologists x 2 and allergist  . SLEEP APNEA, OBSTRUCTIVE    a. doesn't use CPAP  . Syncope    a. 03/2015 s/p MDT LINQ.  [name] Type II diabetes mellitus ([place])        Past Surgical History:  Procedure Laterality Date  . ACNE CYST REMOVAL     2 on back   . AV NODE ABLATION N/A 10/25/2017   Procedure: AV NODE ABLATION;  Surgeon: Deboraha Sprang, MD;  Location: [place] CV LAB;  Service: Cardiovascular;  Laterality: N/A;  . BIV ICD INSERTION CRT-D N/A 09/18/2017   Procedure: BIV ICD INSERTION CRT-D;  Surgeon: Deboraha Sprang, MD;  Location: [place] CV LAB;  Service: Cardiovascular;  Laterality: N/A;  . CARDIAC CATHETERIZATION N/A 03/21/2016   Procedure: Right/Left Heart Cath and Coronary Angiography;  Surgeon: Larey Dresser, MD;  Location: [place] CV LAB;  Service: Cardiovascular;  Laterality: N/A;  . CARDIOVERSION  04/18/2012   Procedure: CARDIOVERSION;  Surgeon: Fay Records, MD;  Location: [place];  Service: Cardiovascular;  Laterality: N/A;  . CARDIOVERSION  04/25/2012   Procedure: CARDIOVERSION;  Surgeon: Thayer Headings, MD;  Location: [place];  Service: Cardiovascular;  Laterality: N/A;  . CARDIOVERSION  04/25/2012   Procedure: CARDIOVERSION;  Surgeon: Fay Records, MD;  Location: [place];  Service: Cardiovascular;  Laterality: N/A;  . CARDIOVERSION  05/09/2012   Procedure: CARDIOVERSION;  Surgeon: Sherren Mocha, MD;  Location: [place];  Service: Cardiovascular;  Laterality: N/A;  changed from crenshaw to cooper by trish/leone-endo  . CARDIOVERSION N/A 03/15/2015   Procedure: CARDIOVERSION;  Surgeon: Thayer Headings, MD;  Location: [facility]  ENDOSCOPY;  Service: Cardiovascular;  Laterality: N/A;  . COLONOSCOPY    . COLONOSCOPY WITH PROPOFOL N/A 10/21/2014   Procedure: COLONOSCOPY WITH PROPOFOL;  Surgeon: Ladene Artist, MD;  Location: [facility] ENDOSCOPY;  Service: Endoscopy;  Laterality: N/A;  . EP IMPLANTABLE DEVICE N/A 04/06/2015   Procedure: Loop Recorder Insertion;  Surgeon: Evans Lance, MD;  Location: [place] CV LAB;  Service: Cardiovascular;  Laterality: N/A;  . ESOPHAGOGASTRODUODENOSCOPY    . JOINT REPLACEMENT    . LOOP RECORDER REMOVAL N/A 09/18/2017   Procedure: LOOP RECORDER REMOVAL;  Surgeon: Deboraha Sprang, MD;  Location: [place] CV LAB;  Service: Cardiovascular;  Laterality: N/A;  . RIGHT/LEFT HEART CATH AND CORONARY ANGIOGRAPHY N/A 01/28/2017   Procedure: Right/Left Heart Cath and Coronary Angiography;  Surgeon: Larey Dresser, MD;  Location: [place] CV LAB;  Service: Cardiovascular;  Laterality: N/A;  . TEE WITHOUT CARDIOVERSION  04/25/2012   Procedure: TRANSESOPHAGEAL ECHOCARDIOGRAM (TEE);  Surgeon: Thayer Headings, MD;  Location: [place];  Service: Cardiovascular;  Laterality: N/A;  . TEE WITHOUT CARDIOVERSION N/A 03/15/2015   Procedure: TRANSESOPHAGEAL ECHOCARDIOGRAM (TEE);  Surgeon: Thayer Headings, MD;  Location: [place];  Service: Cardiovascular;  Laterality: N/A;  . TONSILLECTOMY AND ADENOIDECTOMY    . TOTAL KNEE ARTHROPLASTY Right 06/15/2014   Procedure: TOTAL KNEE ARTHROPLASTY;  Surgeon: Renette Butters, MD;  Location: [place];  Service: Orthopedics;  Laterality: Right;    reports that he quit smoking about 12 years ago. His smoking use included cigarettes. He has a 60.00 pack-year smoking history. He has never used smokeless tobacco. He reports that he does not drink alcohol and does not use drugs. family history includes Allergies in his mother; Asthma in his maternal grandmother and mother; COPD in his mother; Colon polyps in his mother; Hypothyroidism in his mother. Allergies  Allergen  Reactions  . Amiodarone Other (See Comments)    hyperthyroidism  . Statins Other (See Comments)    myalgia  . Tape Other (See Comments)    Skin Tears Use Paper Tape Only   Current Outpatient Medications on File Prior to Visit  Medication Sig Dispense Refill  . acetaminophen (TYLENOL) 500 MG tablet Take 500 mg by mouth every 6 (six) hours as needed.    [name] albuterol (VENTOLIN HFA) 108 (90 Base) MCG/ACT inhaler INHALE 2 PUFFS 4 TIMES A DAY as needed 54 g 3  . ALPRAZolam (XANAX) 0.5 MG tablet TAKE 2 TABLETS BY MOUTH AT BEDTIME 60 tablet 2  . carisoprodol (SOMA) 350 MG tablet TAKE 1 TABLET BY MOUTH THREE TIMES A DAY AS NEEDED FOR MUSCLE SPASMS 90 tablet 5  . carvedilol (COREG) 12.5 MG tablet Take 1 tablet (12.5 mg total) by mouth 2 (two) times daily with a meal. 180 tablet 3  .  digoxin (LANOXIN) 0.25 MG tablet TAKE 1/2 TABLET BY MOUTH EVERY DAY 45 tablet 3  . doxepin (SINEQUAN) 10 MG capsule TAKE 2 CAPSULES BY MOUTH AT BEDTIME. ANNUAL APPT DUE IN JUNE MUST SEE PROVIDER FOR FUTURE REFILLS 180 capsule 1  . ELIQUIS 5 MG TABS tablet     . fluticasone (FLONASE) 50 MCG/ACT nasal spray PLACE 2 SPRAYS INTO BOTH NOSTRILS DAILY AS NEEDED FOR ALLERGIES OR RHINITIS. 48 mL 1  . FLUZONE HIGH-DOSE QUADRIVALENT 0.7 ML SUSY     . hydrocortisone 2.5 % cream APPLY TO AFFECTED AREA TWICE A DAY 28.35 g 6  . hydrOXYzine (ATARAX/VISTARIL) 10 MG tablet TAKE 1 TABLET (10 MG TOTAL) BY MOUTH 3 (THREE) TIMES DAILY AS NEEDED FOR ITCHING. 60 tablet 2  . lidocaine (LIDODERM) 5 % APPLY 1 PATCH TO AFFECTED AREA FOR UP TO 12 HOURS . DO NOT USE MORE THAN ONE PATCH IN 24 HOURS. 90 patch 2  . methotrexate (RHEUMATREX) 2.5 MG tablet TAKE 4 TABLETS BY MOUTH EVERY WEEK 48 tablet 1  . omeprazole (PRILOSEC) 20 MG capsule TAKE 1 CAPSULE BY MOUTH TWICE A DAY 180 capsule 1  . sacubitril-valsartan (ENTRESTO) 24-26 MG Take 1 tablet by mouth 2 (two) times daily. 60 tablet 3  . spironolactone (ALDACTONE) 25 MG tablet TAKE 1 TABLET BY MOUTH  EVERY DAY 90 tablet 3  . tamsulosin (FLOMAX) 0.4 MG CAPS capsule     . torsemide (DEMADEX) 20 MG tablet TAKE 2 TABLETS (40 MG TOTAL) BY MOUTH 2 (TWO) TIMES DAILY. 360 tablet 1  . traMADol (ULTRAM) 50 MG tablet TAKE 1 TABLET (50 MG TOTAL) BY MOUTH DAILY AS NEEDED. 30 tablet 5  . triamcinolone (KENALOG) 0.1 % paste APPLY 1 APPLICATION IN THE MOUTH OR THROAT 2 (TWO) TIMES DAILY AS DIRECTED 5 g 2  . TUMS 500 MG chewable tablet Chew 500-2,000 mg by mouth every 4 (four) hours as needed for indigestion or heartburn.     . valACYclovir (VALTREX) 1000 MG tablet TAKE 1 TABLET BY MOUTH THREE TIMES A DAY 21 tablet 2  . venlafaxine XR (EFFEXOR XR) 75 MG 24 hr capsule Take 1 capsule (75 mg total) by mouth daily with breakfast. 30 capsule 0   No current facility-administered medications on file prior to visit.   Review of Systems All otherwise neg per pt    Objective:   Physical Exam BP 120/80 (BP Location: Left Arm, Patient Position: Sitting, Cuff Size: Large)   Pulse 72   Temp 98.3 F (36.8 C) (Oral)   Ht 5\' 10"  (1.778 m)   Wt 254 lb (115.2 kg)   SpO2 96%   BMI 36.45 kg/m  VS noted,  Constitutional: Pt appears in NAD HENT: Head: NCAT.  Right Ear: External ear normal.  Left Ear: External ear normal.  Eyes: . Pupils are equal, round, and reactive to light. Conjunctivae and EOM are normal Nose: without d/c or deformity Neck: Neck supple. Gross normal ROM Cardiovascular: Normal rate and regular rhythm.   Pulmonary/Chest: Effort normal and breath sounds without rales or wheezing.  Abd:  Soft, NT, ND, + BS, no organomegaly Neurological: Pt is alert. At baseline orientation, motor grossly intact Skin - left leg laceration with edges intact, strips removed, no s/s cellulitis Psychiatric: Pt behavior is normal without agitation  All otherwise neg per pt Lab Results  Component Value Date   WBC 8.1 09/16/2020   HGB 12.9 (L) 09/16/2020   HCT 37.8 (L) 09/16/2020   PLT 224.0 09/16/2020  GLUCOSE  82 09/16/2020   CHOL 220 (H) 09/16/2020   TRIG 239.0 (H) 09/16/2020   HDL 37.90 (L) 09/16/2020   LDLDIRECT 129.0 09/16/2020   LDLCALC 136 (H) 11/06/2019   ALT 33 09/16/2020   AST 20 09/16/2020   NA 142 09/16/2020   K 3.9 09/16/2020   CL 107 09/16/2020   CREATININE 0.98 09/16/2020   BUN 29 (H) 09/16/2020   CO2 26 09/16/2020   TSH 2.40 03/23/2020   PSA 3.24 09/16/2020   INR 4.7 (A) 09/13/2020   HGBA1C 5.6 09/16/2020   MICROALBUR <0.7 03/23/2020         Assessment & Plan:

## 2020-09-16 NOTE — Patient Instructions (Signed)
Your trazodone and ambien were sent again today  Your "stitches" were removed today from the left leg wound  Please continue the non stick gauze coverings for 1 wk with paper tape  Please continue all other medications as before, including the doxycycline  Please have the pharmacy call with any other refills you may need.  Please continue your efforts at being more active, low cholesterol diet, and weight control.  Please keep your appointments with your specialists as you may have planned  Please go to the LAB at the blood drawing area for the tests to be done  You will be contacted by phone if any changes need to be made immediately.  Otherwise, you will receive a letter about your results with an explanation, but please check with MyChart first.  Please remember to sign up for MyChart if you have not done so, as this will be important to you in the future with finding out test results, communicating by private email, and scheduling acute appointments online when needed.  OK to cancel the Dec 9 appt  Please make an Appointment to return in 6 months, or sooner if needed

## 2020-09-16 NOTE — Assessment & Plan Note (Signed)
Increased recently at [facility], to f/u coumadin clinic

## 2020-09-17 ENCOUNTER — Encounter: Payer: Self-pay | Admitting: Internal Medicine

## 2020-09-17 DIAGNOSIS — S81812A Laceration without foreign body, left lower leg, initial encounter: Secondary | ICD-10-CM | POA: Insufficient documentation

## 2020-09-17 NOTE — Assessment & Plan Note (Signed)
Asympt, also for f/u psa 

## 2020-09-17 NOTE — Assessment & Plan Note (Addendum)
Edges intact, for redress today, finish doxy course, no further treatment likeley needed  I spent 31 minutes in preparing to see the patient by review of recent labs, imaging and procedures, obtaining and reviewing separately obtained history, communicating with the patient and family or caregiver, ordering medications, tests or procedures, and documenting clinical information in the EHR including the differential Dx, treatment, and any further evaluation and other management of left leg laceration, psa elevation, htn, dm, increased inr

## 2020-09-17 NOTE — Assessment & Plan Note (Signed)
stable overall by history and exam, recent data reviewed with pt, and pt to continue medical treatment as before,  to f/u any worsening symptoms or concerns  

## 2020-09-19 ENCOUNTER — Ambulatory Visit (INDEPENDENT_AMBULATORY_CARE_PROVIDER_SITE_OTHER): Payer: Medicare HMO | Admitting: Orthotics

## 2020-09-19 ENCOUNTER — Other Ambulatory Visit: Payer: Self-pay

## 2020-09-19 DIAGNOSIS — E119 Type 2 diabetes mellitus without complications: Secondary | ICD-10-CM | POA: Diagnosis not present

## 2020-09-19 DIAGNOSIS — E11622 Type 2 diabetes mellitus with other skin ulcer: Secondary | ICD-10-CM

## 2020-09-19 DIAGNOSIS — M21611 Bunion of right foot: Secondary | ICD-10-CM | POA: Diagnosis not present

## 2020-09-19 DIAGNOSIS — M21612 Bunion of left foot: Secondary | ICD-10-CM | POA: Diagnosis not present

## 2020-09-19 DIAGNOSIS — L97923 Non-pressure chronic ulcer of unspecified part of left lower leg with necrosis of muscle: Secondary | ICD-10-CM

## 2020-09-20 NOTE — Progress Notes (Signed)

## 2020-09-22 ENCOUNTER — Other Ambulatory Visit: Payer: Self-pay

## 2020-09-22 ENCOUNTER — Ambulatory Visit (INDEPENDENT_AMBULATORY_CARE_PROVIDER_SITE_OTHER): Payer: Medicare HMO | Admitting: General Practice

## 2020-09-22 ENCOUNTER — Ambulatory Visit: Payer: Medicare HMO | Admitting: Internal Medicine

## 2020-09-22 DIAGNOSIS — Z7901 Long term (current) use of anticoagulants: Secondary | ICD-10-CM | POA: Diagnosis not present

## 2020-09-22 LAB — POCT INR: INR: 3.3 — AB (ref 2.0–3.0)

## 2020-09-22 NOTE — Progress Notes (Signed)
Medical screening examination/treatment/procedure(s) were performed by non-physician practitioner and as supervising physician I was immediately available for consultation/collaboration. I agree with above. Paulmichael Schreck, MD   

## 2020-09-22 NOTE — Patient Instructions (Addendum)
Pre visit review using our clinic review tool, if applicable. No additional management support is needed unless otherwise documented below in the visit note.  Hold coumadin today.  Change dosage and take 1 tablet daily except take 1/2 tablet on Sunday Tues and Thursday.  Re-check in 3 to 4 weeks.

## 2020-09-26 ENCOUNTER — Ambulatory Visit (INDEPENDENT_AMBULATORY_CARE_PROVIDER_SITE_OTHER): Payer: Medicare HMO

## 2020-09-26 DIAGNOSIS — I5022 Chronic systolic (congestive) heart failure: Secondary | ICD-10-CM

## 2020-09-26 DIAGNOSIS — Z95810 Presence of automatic (implantable) cardiac defibrillator: Secondary | ICD-10-CM

## 2020-09-28 ENCOUNTER — Telehealth: Payer: Self-pay | Admitting: Internal Medicine

## 2020-09-28 NOTE — Telephone Encounter (Signed)
Pt c/o swelling: STAT is pt has developed SOB within 24 hours  1) How much weight have you gained and in what time span?  n/a  2) If swelling, where is the swelling located?  Ankles feel and calves are swollen   3) Are you currently taking a fluid pill?  Yes, 6 per day   4) Are you currently SOB?  No, not when he is sitting still   5) Do you have a log of your daily weights (if so, list)?  No   6) Have you gained 3 pounds in a day or 5 pounds in a week?  N/a patient is unsure   7) Have you traveled recently?  No   Pt c/o Shortness Of Breath: STAT if SOB developed within the last 24 hours or pt is noticeably SOB on the phone  1. Are you currently SOB (can you hear that pt is SOB on the phone)?  No   2. How long have you been experiencing SOB?  A couple of weeks   3. Are you SOB when sitting or when up moving around?  When moving around   4. Are you currently experiencing any other symptoms? No energy

## 2020-09-28 NOTE — Telephone Encounter (Signed)
Attempted phone call to pt.  Left voicemail message to contact RN at [phone]. 

## 2020-09-28 NOTE — Progress Notes (Signed)
EPIC Encounter for ICM Monitoring  Patient Name: [name] is a 64 y.o. male Date: 09/28/2020 Primary Care Physican: [name], MD Primary Cardiologist:[place] Electrophysiologist:Klein Bi-V Pacing:100% 09/13/2020 Weight: 252lbs 09/26/2020 Weight: 259 lbs     Spoke with patient and reports 7 lb weight gain in past 2 weeks, [name] of breath feet and legs swollen up to calf (unable to get shoes on).   Numerous ICM discussions on importance of limiting salt intake and avoid restaurant foods but patient continues to eat restaurant foods daily.  Optivol thoracic impedance back to baseline after taking extra Furosemide x 2 days 11/30 but decreased impedance returned again on 09/13/2020  Prescribed:  Torsemide 20 mg to3tablets (60 mg total)and 2 tablets (40 mg total) every evening.  Spironolactone 25 mg take 1 tablet daily.  Labs: 07/14/2020 Creatinine 1.19, BUN 22, Potassium 4.7, Sodium 134, GFR >60 04/22/2020 Creatinine 1.15, BUN 34, Potassium 3.7, Sodium 139, GFR >60 03/11/2020 Creatinine 0.80, BUN 15, Potassium 4.8, Sodium 139, GFR >60 12/22/2019 Creatinine 1.08, BUN 24, Potassium 4.0, Sodium 139, GFR >60 A complete set of results can be found in Results Review.  Recommendations: Patient has been taking Torsemide 3 tablets twice a day for past 2 days but has poor urine output.  Advised to avoid restaurant foods and if changes in condition to contact either Dr Oleh Genin office or use ER if needed.   Follow-up plan: ICM clinic phone appointment on1/12/2020 since patient will be seen in office on 10/10/2020. 91 day device clinic remote transmission3/14/2022  EP/Cardiology Office Visits:10/10/2020 with Dr [name].  Copy of ICM check sent to [place].  Copy sent to Dr [name] for review and recommendations.   3 month ICM trend: 09/28/2020    1 Year ICM trend:       [name], RN 09/28/2020 1:14 PM

## 2020-10-02 ENCOUNTER — Other Ambulatory Visit: Payer: Self-pay | Admitting: Family Medicine

## 2020-10-02 ENCOUNTER — Other Ambulatory Visit: Payer: Self-pay | Admitting: Cardiology

## 2020-10-02 ENCOUNTER — Other Ambulatory Visit: Payer: Self-pay | Admitting: Internal Medicine

## 2020-10-02 NOTE — Progress Notes (Signed)
Increase torsemide to 80 qam/60 qpm x 2 days then 60 mg bid after that.  BMET 1 week.

## 2020-10-03 ENCOUNTER — Ambulatory Visit (INDEPENDENT_AMBULATORY_CARE_PROVIDER_SITE_OTHER): Payer: Medicare HMO

## 2020-10-03 DIAGNOSIS — I5022 Chronic systolic (congestive) heart failure: Secondary | ICD-10-CM

## 2020-10-03 DIAGNOSIS — I428 Other cardiomyopathies: Secondary | ICD-10-CM | POA: Diagnosis not present

## 2020-10-03 LAB — CUP PACEART REMOTE DEVICE CHECK
Battery Remaining Longevity: 46 mo
Battery Voltage: 2.96 V
Brady Statistic AP VP Percent: 0 %
Brady Statistic AP VS Percent: 0 %
Brady Statistic AS VP Percent: 0 %
Brady Statistic AS VS Percent: 0 %
Brady Statistic RA Percent Paced: 0 %
Brady Statistic RV Percent Paced: 99.95 %
Date Time Interrogation Session: 20211219132706
HighPow Impedance: 82 Ohm
Implantable Lead Implant Date: 20181205
Implantable Lead Implant Date: 20181205
Implantable Lead Location: 753858
Implantable Lead Location: 753860
Implantable Lead Model: 4398
Implantable Pulse Generator Implant Date: 20181205
Lead Channel Impedance Value: 1197 Ohm
Lead Channel Impedance Value: 1197 Ohm
Lead Channel Impedance Value: 1368 Ohm
Lead Channel Impedance Value: 1425 Ohm
Lead Channel Impedance Value: 257.018
Lead Channel Impedance Value: 299.657
Lead Channel Impedance Value: 306.082
Lead Channel Impedance Value: 351.87 Ohm
Lead Channel Impedance Value: 360.762
Lead Channel Impedance Value: 361 Ohm
Lead Channel Impedance Value: 4047 Ohm
Lead Channel Impedance Value: 456 Ohm
Lead Channel Impedance Value: 456 Ohm
Lead Channel Impedance Value: 589 Ohm
Lead Channel Impedance Value: 874 Ohm
Lead Channel Impedance Value: 931 Ohm
Lead Channel Impedance Value: 950 Ohm
Lead Channel Impedance Value: 988 Ohm
Lead Channel Pacing Threshold Amplitude: 0.375 V
Lead Channel Pacing Threshold Pulse Width: 0.4 ms
Lead Channel Sensing Intrinsic Amplitude: 12.375 mV
Lead Channel Sensing Intrinsic Amplitude: 12.375 mV
Lead Channel Setting Pacing Amplitude: 2.25 V
Lead Channel Setting Pacing Amplitude: 2.5 V
Lead Channel Setting Pacing Pulse Width: 0.4 ms
Lead Channel Setting Pacing Pulse Width: 0.6 ms
Lead Channel Setting Sensing Sensitivity: 0.3 mV

## 2020-10-03 MED ORDER — TORSEMIDE 20 MG PO TABS: 60.0000 mg | ORAL_TABLET | Freq: Two times a day (BID) | ORAL | 2 refills | Status: DC

## 2020-10-03 NOTE — Addendum Note (Signed)
Addended by: [name] on: 10/03/2020 10:40 AM   Modules accepted: Orders

## 2020-10-03 NOTE — Progress Notes (Signed)
Spoke with patient.  Advised of Dr Claris Gladden recommendations to Increase torsemide to 80 qam/60 qpm x 2 days then 60 mg bid after that.  BMET 1 week which can be done at the 12/27 office visit with Dr [name].  He verbalized understanding of medication changes and agreed.  He does not need Torsemide refill at this time.  Weight today is 253 lbs and thinks he has lost about 5 pounds.  His feet remain swollen but can get shoes on.   Advised to call back with changes in condition or questions.

## 2020-10-06 ENCOUNTER — Telehealth (HOSPITAL_COMMUNITY): Payer: Self-pay | Admitting: Pharmacy Technician

## 2020-10-06 NOTE — Telephone Encounter (Signed)
Sent in [employer] application via fax.  Will follow up.

## 2020-10-10 ENCOUNTER — Ambulatory Visit (HOSPITAL_COMMUNITY)
Admission: RE | Admit: 2020-10-10 | Discharge: 2020-10-10 | Disposition: A | Discharge status: Home / Self Care | Payer: Medicare HMO | Source: Ambulatory Visit | Attending: Cardiology | Admitting: Cardiology

## 2020-10-10 ENCOUNTER — Telehealth: Payer: Self-pay | Admitting: Internal Medicine

## 2020-10-10 ENCOUNTER — Other Ambulatory Visit: Payer: Self-pay

## 2020-10-10 ENCOUNTER — Encounter (HOSPITAL_COMMUNITY): Payer: Self-pay | Admitting: Cardiology

## 2020-10-10 VITALS — BP 140/98 | HR 71 | Wt 260.0 lb

## 2020-10-10 DIAGNOSIS — Z79899 Other long term (current) drug therapy: Secondary | ICD-10-CM | POA: Diagnosis not present

## 2020-10-10 DIAGNOSIS — J449 Chronic obstructive pulmonary disease, unspecified: Secondary | ICD-10-CM | POA: Insufficient documentation

## 2020-10-10 DIAGNOSIS — I4821 Permanent atrial fibrillation: Secondary | ICD-10-CM | POA: Insufficient documentation

## 2020-10-10 DIAGNOSIS — G4733 Obstructive sleep apnea (adult) (pediatric): Secondary | ICD-10-CM | POA: Insufficient documentation

## 2020-10-10 DIAGNOSIS — I4819 Other persistent atrial fibrillation: Secondary | ICD-10-CM | POA: Diagnosis not present

## 2020-10-10 DIAGNOSIS — Z8616 Personal history of COVID-19: Secondary | ICD-10-CM | POA: Insufficient documentation

## 2020-10-10 DIAGNOSIS — I5022 Chronic systolic (congestive) heart failure: Secondary | ICD-10-CM | POA: Diagnosis not present

## 2020-10-10 DIAGNOSIS — I442 Atrioventricular block, complete: Secondary | ICD-10-CM | POA: Diagnosis not present

## 2020-10-10 DIAGNOSIS — Z87891 Personal history of nicotine dependence: Secondary | ICD-10-CM | POA: Insufficient documentation

## 2020-10-10 DIAGNOSIS — I110 Hypertensive heart disease with heart failure: Secondary | ICD-10-CM | POA: Diagnosis not present

## 2020-10-10 DIAGNOSIS — I428 Other cardiomyopathies: Secondary | ICD-10-CM | POA: Insufficient documentation

## 2020-10-10 DIAGNOSIS — Z95810 Presence of automatic (implantable) cardiac defibrillator: Secondary | ICD-10-CM | POA: Insufficient documentation

## 2020-10-10 DIAGNOSIS — I2510 Atherosclerotic heart disease of native coronary artery without angina pectoris: Secondary | ICD-10-CM | POA: Insufficient documentation

## 2020-10-10 DIAGNOSIS — I5030 Unspecified diastolic (congestive) heart failure: Secondary | ICD-10-CM

## 2020-10-10 DIAGNOSIS — Z9981 Dependence on supplemental oxygen: Secondary | ICD-10-CM | POA: Diagnosis not present

## 2020-10-10 DIAGNOSIS — Z7901 Long term (current) use of anticoagulants: Secondary | ICD-10-CM | POA: Diagnosis not present

## 2020-10-10 DIAGNOSIS — L2089 Other atopic dermatitis: Secondary | ICD-10-CM | POA: Diagnosis not present

## 2020-10-10 LAB — DIGOXIN LEVEL: Digoxin Level: 0.8 ng/mL (ref 0.8–2.0)

## 2020-10-10 LAB — BASIC METABOLIC PANEL
Anion gap: 11 (ref 5–15)
BUN: 28 mg/dL — ABNORMAL HIGH (ref 8–23)
CO2: 26 mmol/L (ref 22–32)
Calcium: 9.3 mg/dL (ref 8.9–10.3)
Chloride: 100 mmol/L (ref 98–111)
Creatinine, Ser: 1.26 mg/dL — ABNORMAL HIGH (ref 0.61–1.24)
GFR, Estimated: >60 mL/min (ref >60)
Glucose, Bld: 89 mg/dL (ref 70–99)
Potassium: 3.7 mmol/L (ref 3.5–5.1)
Sodium: 137 mmol/L (ref 135–145)

## 2020-10-10 MED ORDER — ENTRESTO 24-26 MG PO TABS: 1.0000 | ORAL_TABLET | Freq: Two times a day (BID) | ORAL | 3 refills | Status: DC

## 2020-10-10 MED ORDER — TORSEMIDE 20 MG PO TABS
60.0000 mg | ORAL_TABLET | Freq: Two times a day (BID) | ORAL | 3 refills | Status: DC
Start: 2020-10-10 — End: 2020-11-07

## 2020-10-10 NOTE — Progress Notes (Signed)
Medication Samples have been provided to the patient.  Drug name: [name]       Strength: 24/26mg         Qty: 2 bottles  LOT: [id number] and [id number]  Exp.Date: 3/23 and 12/23  Dosing instructions: 1 tablet twice daily  The patient has been instructed regarding the correct time, dose, and frequency of taking this medication, including desired effects and most common side effects.   Malena Edman 12:37 PM 10/10/2020

## 2020-10-10 NOTE — Telephone Encounter (Signed)
Labs have been faxed to pt Dermatologist.

## 2020-10-10 NOTE — Progress Notes (Signed)
ID:  [name], DOB [birth year], MRN [id number]  Provider location: [name] Type of Visit: Established patient  PCP:  Zachary Borg, MD  Cardiologist:  Dr. Aundra [name]   History of Present Illness: [name] is a 64 y.o. male who has history of COPD on home oxygen, permanent atrial fibrillation, and chronic systolic CHF.  Amiodarone and DCCV were tried for atrial fibrillation in the past without success.   Patient has been noted to have a low EF since 2013.  EF improved by to normal by 2015 but dropped to 30-35% by 11/16.  Cath in 6/17 showed some CAD but not enough to cause cardiomyopathy.  He was admitted in 3/18 with PNA, atrial fibrillation/RVR, and acute on chronic systolic CHF.  Echo in 3/18 showed fall in EF to 10-15%.   I took him for right and left heart cath in 4/18, which showed nonobstructive CAD and relatively optimized filling pressures.    Syncope in 11/18. Loop recorder showed 6 second pause and periods of complete heart block. He had Medtronic BiV ICD placed in 12/18.  In 1/19, he had AV nodal ablation to promote BiV pacing.   Echo was done 6/19 with EF 25-30% with moderate RV dilation/mildly decreased systolic function. CPX 7/19 was submaximal, probably mild functional impairment.    Echo in 1/21 showed EF 35-40% with diffuse hypokinesis, moderate RV dilation with mildly decreased systolic function.   He was admitted with COVID-19 PNA in 2/19.  He had AKI/hypotension and Entresto was stopped.   After last appointment, he was supposed to restart Entresto.  He took it for a month then did not get it refilled.  He did not have any problems with it.  He has had myalgias now with multiple statins and is off cholesterol meds.  PCSK9-inhibitors are too expensive for him.    Patient returns for followup of CHF.   Weight is up 10 lbs.  He is short of breath walking about 100 feet.  He has been sleeping in his recliner for years.  Short of breath walking  around stores.  No chest pain.  No lightheadedness.  Overall, has felt worse x about 1 month.   Medtronic device interrogation: >99% BiV pacing, stable thoracic impedance, always in atrial fibrillation.  Unable to get adequate signal from REDS clip.     Labs (4/18): pro-BNP 904, K 3.1, creatinine 1.39 => 1.08 with BUN 66 => 37, hgb 14.1.  Labs (5/18): hemoglobin 13.1 Labs (6/18): K 4, creatinine 1.3, BNP 294 Labs (7/18): K 3.8, creatinine 1.23, LDL 96, HDL 42 Labs (8/18): K 4.5, creatinine 1.22 Labs (10/18): digoxin 0.4 Labs (12/18): LDL 115, HDL 37, TGs 214, K 4.2, creatinine 0.84 Labs (1/19): K 4.4, creatinine 0.75, pro-BNP 433 Labs (2/19): digoxin 0.4 Labs (3/19): LDL 121, hgb 12.8, K 4.5, creatinine 0.98 Labs (6/19): LDL 48, HDL 45, K 5, creatinine 0.94, digoxin 0.3 Labs (9/19): K 3.7, creatinine 1.09 => 1.32, digoxin 0.2 Labs (11/19): LDL 115 Labs (12/20): K 3.9, creatinine 0.87, LDL 94  Labs (3/21): K 4, creatinine 1.08 Labs (6/21): LDL 109 Labs (7/21): K 3.7, creatinine 1.15 Labs (12/21): LDL 129, TGs 239, hgb 12.9, K 3.9, creatinine 0.98  PMH: 1. Asthma 2. COPD: On home oxygen. Prior smoker.  3. Atrial fibrillation: Permanent.  He failed DCCV and amiodarone in the past . 4. HTN 5. GERD 6. Hyperlipidemia: Myalgias with atorvastatin 7. BPH.  8. Chronic systolic CHF: Nonischemic cardiomyopathy.  -  EF 25% by TEE in 7/13.  Normalized on 2015 echo. - Echo (11/16): EF 30-35%.  - LHC/RHC (6/17): 80% ostial stenosis small D1, 30-40% pLAD.  Mean RA 4, PA 26/10, mean PCWP 11, CI 2.33.  - Echo (3/18): EF 10-15%, diffuse hypokinesis, severe LV dilation, moderate RV dilation with severely decreased systolic function.  - LHC/RHC (4/18): 40% proximal LAD.  Mean RA 8, PA 37/10 mean 22, mean PCWP 22, LVEDP 9, CI 2.39. - Medtronic BiV ICD placement in 12/18 with AV nodal ablation to promote BiV pacing in 1/19.  - Echo (6/19): EF 25-30% with mild LV dilation, moderately dilated RV with  mildly decreased systolic function, IVC not dilated, PASP 23 mmHg.  - CPX (7/19): peak VO2 17.6 (78% predicted), VE/VCO2 slope 31, RER 1.02 => submaximal, probably mild functional impairment.  - Echo (1/21): EF 35-40% with diffuse hypokinesis, moderate RV dilation with mildly decreased systolic function. 9. Morbid obesity 10. ILR in place.  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 Complete heart block: s/p Medtronic CRT-D.  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 OSA: Untreated, unable to tolerate CPAP.  15. COVID-19 PNA 2/21.  16. Hyperlipidemia: Myalgias with multiple statins.   Social History   Socioeconomic History  . Marital status: Divorced    Spouse name: Not on file  . Number of children: 2  . Years of education: Not on file  . Highest education level: Not on file  Occupational History  . Occupation: retired/disabled. prev worked in Therapist, sports.    Employer: DISABLED  Tobacco Use  . Smoking status: Former Smoker    Packs/day: 2.00    Years: 30.00    Pack years: 60.00    Types: Cigarettes    Quit date: 10/16/2007    Years since quitting: 12.9  . Smokeless tobacco: Never Used  Vaping Use  . Vaping Use: Never used  Substance and Sexual Activity  . Alcohol use: No  . Drug use: No  . Sexual activity: Not Currently  Other Topics Concern  . Not on file  Social History Narrative   Lives alone.   Social Determinants of Health   Financial Resource Strain: Not on file  Food Insecurity: Not on file  Transportation Needs: Not on file  Physical Activity: Not on file  Stress: Not on file  Social Connections: Not on file  Intimate Partner Violence: Not on file   Family History  Problem Relation Age of Onset  . COPD Mother   . Asthma Mother   . Colon polyps Mother   . Allergies Mother   . Hypothyroidism Mother   . Asthma Maternal Grandmother   . Colon cancer Neg Hx    ROS: All systems reviewed and negative except as per HPI.   Current Outpatient Medications   Medication Sig Dispense Refill  . acetaminophen (TYLENOL) 500 MG tablet Take 500 mg by mouth every 6 (six) hours as needed.    [name] albuterol (VENTOLIN HFA) 108 (90 Base) MCG/ACT inhaler INHALE 2 PUFFS 4 TIMES A DAY as needed 54 g 3  . ALPRAZolam (XANAX) 0.5 MG tablet TAKE 2 TABLETS BY MOUTH AT BEDTIME 60 tablet 2  . carisoprodol (SOMA) 350 MG tablet TAKE 1 TABLET BY MOUTH THREE TIMES A DAY AS NEEDED FOR MUSCLE SPASMS 90 tablet 5  . carvedilol (COREG) 12.5 MG tablet Take 1 tablet (12.5 mg total) by mouth 2 (two) times daily with a meal. 180 tablet 3  . digoxin (LANOXIN) 0.25 MG tablet TAKE 1/2 TABLET BY MOUTH EVERY DAY  45 tablet 3  . doxepin (SINEQUAN) 10 MG capsule TAKE 2 CAPSULES BY MOUTH AT BEDTIME. ANNUAL APPT DUE IN JUNE MUST SEE PROVIDER FOR FUTURE REFILLS 180 capsule 1  . fluticasone (FLONASE) 50 MCG/ACT nasal spray PLACE 2 SPRAYS INTO BOTH NOSTRILS DAILY AS NEEDED FOR ALLERGIES OR RHINITIS. 48 mL 1  . hydrOXYzine (ATARAX/VISTARIL) 10 MG tablet TAKE 1 TABLET (10 MG TOTAL) BY MOUTH 3 (THREE) TIMES DAILY AS NEEDED FOR ITCHING. 60 tablet 2  . lidocaine (LIDODERM) 5 % APPLY 1 PATCH TO AFFECTED AREA FOR UP TO 12 HOURS . DO NOT USE MORE THAN ONE PATCH IN 24 HOURS. 90 patch 2  . methotrexate (RHEUMATREX) 2.5 MG tablet Take 2.5 mg by mouth once a week. Caution:Chemotherapy. Protect from light. 7 tablets    . omeprazole (PRILOSEC) 20 MG capsule TAKE 1 CAPSULE BY MOUTH TWICE A DAY 180 capsule 1  . spironolactone (ALDACTONE) 25 MG tablet TAKE 1 TABLET BY MOUTH EVERY DAY 90 tablet 3  . tamsulosin (FLOMAX) 0.4 MG CAPS capsule     . traMADol (ULTRAM) 50 MG tablet TAKE 1 TABLET (50 MG TOTAL) BY MOUTH DAILY AS NEEDED. 30 tablet 5  . traZODone (DESYREL) 100 MG tablet TAKE 1 TABLET BY MOUTH EVERY DAY AT BEDTIME AS NEEDED 90 tablet 1  . TUMS 500 MG chewable tablet Chew 500-2,000 mg by mouth every 4 (four) hours as needed for indigestion or heartburn.     . valACYclovir (VALTREX) 1000 MG tablet TAKE 1 TABLET BY  MOUTH THREE TIMES A DAY 21 tablet 2  . venlafaxine XR (EFFEXOR XR) 75 MG 24 hr capsule Take 1 capsule (75 mg total) by mouth daily with breakfast. 30 capsule 0  . warfarin (COUMADIN) 10 MG tablet Take 10 mg by mouth daily. Mon,wed,fri,sat    . warfarin (COUMADIN) 5 MG tablet Take 5 mg by mouth daily. Sun,Tues, Thursday    . zolpidem (AMBIEN) 10 MG tablet 1 by mouth at bedtime as needed 90 tablet 1  . sacubitril-valsartan (ENTRESTO) 24-26 MG Take 1 tablet by mouth 2 (two) times daily. 60 tablet 3  . torsemide (DEMADEX) 20 MG tablet Take 3 tablets (60 mg total) by mouth 2 (two) times daily. 270 tablet 3   No current facility-administered medications for this encounter.   BP (!) 140/98   Pulse 71   Wt 117.9 kg (260 lb)   SpO2 97%   BMI 37.31 kg/m  General: NAD Neck: Thick, JVP 8-9 cm, no thyromegaly or thyroid nodule.  Lungs: Clear to auscultation bilaterally with normal respiratory effort. CV: Nondisplaced PMI.  Heart regular S1/S2, no S3/S4, no murmur.  1+ edema 1/2 to knees bilaterally.  No carotid bruit.  Normal pedal pulses.  Abdomen: Soft, nontender, no hepatosplenomegaly, no distention.  Skin: Intact without lesions or rashes.  Neurologic: Alert and oriented x 3.  Psych: Normal affect. Extremities: No clubbing or cyanosis.  HEENT: Normal.   Assessment/Plan:  1. Atrial fibrillation: Permanent.  Now s/p AV nodal ablation to allow effective BiV pacing.  - Continue warfarin (cannot afford DOACs).  2. COPD: No longer smoking.  Not using oxygen.  3. Chronic systolic CHF: Nonischemic cardiomyopathy based on 6/17 cath. However, he had a recent significant fall in EF from 30-35% to 10-15% on echo from 3/18.  LHC/RHC in 4/18 showed nonobstructive CAD and relatively optimized filling pressures.  He now has Medtronic CRT-D device with AV nodal ablation to allow BiV pacing.  Echo 6/19 with EF 25-30% with mildly decreased  RV systolic function. CPX 7/19 was submaximal but likely only mild  functional limitation from CHF.  Echo in 1/21 showed EF 35-40%.  NYHA class III symptoms, worse for about a month.  Though Optivol is normal, he is volume overloaded by exam and weight is up. - Continue Coreg 12.5 mg bid.   - Continue spironolactone 25 mg daily.        - Restart Entresto 24/26 bid, BMET today and in 10 days.   - Increase torsemide to 60 mg bid.  - Continue digoxin 0.125 daily, check level.  4. CAD: Nonobstructive on 4/18 cath.  No exertional chest pain.  - He is on warfarin so no ASA.  - Statin myalgias, unable to afford PCSK9-inhibitor.  Lipid clinic was seeing if he would qualify for a clinical trial.    5. Complete heart block: Now has Medtronic CRT-D device and s/p AV nodal ablation.  6. Rash: Spongiotic dermatitis.  Improved.  7. OSA: Unable to tolerate CPAP.    Followup in 3 wks with NP/PA to reassess volume and make sure he tolerates Entresto.   Signed, Marca Ancona, MD  10/10/2020  [facility] [address] [facility] [place] [address] [phone] (office) [phone] (fax)

## 2020-10-10 NOTE — Patient Instructions (Addendum)
RESTART Entresto 24/26mg  (1 tablet) Twice daily. Samples have been sent with you.  INCREASE Torsemide 60mg  (3 tablets) Twice daily  Labs done today, your results will be available in MyChart, we will contact you for abnormal readings.  Your physician recommends that you schedule repeat labs in 10 days  Your physician recommends that you schedule a follow-up appointment in: 3 weeks with APP  If you have any questions or concerns before your next appointment please send a message through [facility] or call our office at [phone].    TO LEAVE A MESSAGE FOR THE NURSE SELECT OPTION 2, PLEASE LEAVE A MESSAGE INCLUDING: . YOUR NAME . DATE OF BIRTH . CALL BACK NUMBER . REASON FOR CALL**this is important as we prioritize the call backs  YOU WILL RECEIVE A CALL BACK THE SAME DAY AS LONG AS YOU CALL BEFORE 4:00 PM

## 2020-10-10 NOTE — Telephone Encounter (Signed)
   Patient currently at dermatology appointment, requesting last labs be faxed to [phone], phone [phone]

## 2020-10-13 ENCOUNTER — Telehealth (HOSPITAL_COMMUNITY): Payer: Self-pay | Admitting: Pharmacist

## 2020-10-13 MED ORDER — ENTRESTO 24-26 MG PO TABS
1.0000 | ORAL_TABLET | Freq: Two times a day (BID) | ORAL | 3 refills | Status: DC
Start: 2020-10-13 — End: 2021-01-31

## 2020-10-13 NOTE — Telephone Encounter (Signed)
Refill for [facility] sent to [facility].   Karle Plumber, PharmD, BCPS, BCCP, CPP [facility] Clinic Pharmacist [phone]

## 2020-10-13 NOTE — Progress Notes (Signed)
Remote ICD transmission.   

## 2020-10-17 ENCOUNTER — Ambulatory Visit: Payer: Medicare HMO

## 2020-10-17 ENCOUNTER — Telehealth: Payer: Self-pay | Admitting: Podiatry

## 2020-10-17 NOTE — Telephone Encounter (Signed)
Pt returned call and is scheduled to see Raiford Noble 1.13.2022. He is seeing Dr Lilian Kapur the same day and is having a issue with shoes fitting.

## 2020-10-17 NOTE — Telephone Encounter (Signed)
Refill done.  

## 2020-10-17 NOTE — Telephone Encounter (Signed)
Pt left message stating he needed an appt to return a pair of orthotics his feet will not fit in them.  I returned call and left message for pt to call to discuss appt.

## 2020-10-18 ENCOUNTER — Telehealth: Payer: Self-pay

## 2020-10-18 ENCOUNTER — Other Ambulatory Visit: Payer: Self-pay | Admitting: Internal Medicine

## 2020-10-18 DIAGNOSIS — I255 Ischemic cardiomyopathy: Secondary | ICD-10-CM

## 2020-10-18 DIAGNOSIS — I4819 Other persistent atrial fibrillation: Secondary | ICD-10-CM

## 2020-10-18 DIAGNOSIS — I428 Other cardiomyopathies: Secondary | ICD-10-CM

## 2020-10-18 NOTE — Telephone Encounter (Signed)
Please refill as per office routine med refill policy (all routine meds refilled for 3 mo or monthly per pt preference up to one year from last visit, then month to month grace period for 3 mo, then further med refills will have to be denied)  

## 2020-10-18 NOTE — Telephone Encounter (Signed)
Spoke with patient. Requested he send remote transmission today since the automatic was not received on 10/17/2020.  He was not home at this time.  He asked if the monitor can be moved since he stays with a friend 5-6 days out of the week.  Advised to move monitor to the home where is staying and if he has any trouble with monitor to call tech support number on the monitor.  He said he will do so and will send updated report on 10/21/2020.

## 2020-10-19 NOTE — Telephone Encounter (Signed)
Spoke with [name] who reports he saw Dr Shirlee Latch at the end of December and was restarted on Entresto and Torsemide was increased to 60mg  bid.  [name] reports some improvement in "fluid"  And denies new or worsening SOB or edema at this time.  [name] states he has lab work to complete tomorrow. [name] advised to continue current medications and follow up as scheduled.

## 2020-10-20 ENCOUNTER — Ambulatory Visit (HOSPITAL_COMMUNITY)
Admission: RE | Admit: 2020-10-20 | Discharge: 2020-10-20 | Disposition: A | Discharge status: Home / Self Care | Payer: Medicare HMO | Source: Ambulatory Visit | Attending: Cardiology | Admitting: Cardiology

## 2020-10-20 ENCOUNTER — Other Ambulatory Visit: Payer: Self-pay

## 2020-10-20 DIAGNOSIS — I5022 Chronic systolic (congestive) heart failure: Secondary | ICD-10-CM

## 2020-10-20 DIAGNOSIS — E7849 Other hyperlipidemia: Secondary | ICD-10-CM

## 2020-10-20 DIAGNOSIS — I5030 Unspecified diastolic (congestive) heart failure: Secondary | ICD-10-CM

## 2020-10-20 LAB — LIPID PANEL
Cholesterol: 246 mg/dL — ABNORMAL HIGH (ref 0–200)
HDL: 33 mg/dL — ABNORMAL LOW (ref >40)
LDL Cholesterol: 162 mg/dL — ABNORMAL HIGH (ref 0–99)
Total CHOL/HDL Ratio: 7.5 RATIO
Triglycerides: 256 mg/dL — ABNORMAL HIGH (ref <150)
VLDL: 51 mg/dL — ABNORMAL HIGH (ref 0–40)

## 2020-10-20 LAB — HEPATIC FUNCTION PANEL
ALT: 76 U/L — ABNORMAL HIGH (ref 0–44)
AST: 40 U/L (ref 15–41)
Albumin: 3.8 g/dL (ref 3.5–5.0)
Alkaline Phosphatase: 46 U/L (ref 38–126)
Bilirubin, Direct: 0.2 mg/dL (ref 0.0–0.2)
Indirect Bilirubin: 0.8 mg/dL (ref 0.3–0.9)
Total Bilirubin: 1 mg/dL (ref 0.3–1.2)
Total Protein: 6.5 g/dL (ref 6.5–8.1)

## 2020-10-20 LAB — BASIC METABOLIC PANEL
Anion gap: 9 (ref 5–15)
BUN: 22 mg/dL (ref 8–23)
CO2: 23 mmol/L (ref 22–32)
Calcium: 9.1 mg/dL (ref 8.9–10.3)
Chloride: 108 mmol/L (ref 98–111)
Creatinine, Ser: 1.16 mg/dL (ref 0.61–1.24)
GFR, Estimated: >60 mL/min (ref >60)
Glucose, Bld: 110 mg/dL — ABNORMAL HIGH (ref 70–99)
Potassium: 3.8 mmol/L (ref 3.5–5.1)
Sodium: 140 mmol/L (ref 135–145)

## 2020-10-24 ENCOUNTER — Ambulatory Visit: Payer: Medicare HMO | Admitting: Family Medicine

## 2020-10-24 ENCOUNTER — Encounter: Payer: Self-pay | Admitting: Family Medicine

## 2020-10-24 ENCOUNTER — Ambulatory Visit: Payer: Self-pay

## 2020-10-24 ENCOUNTER — Other Ambulatory Visit: Payer: Self-pay

## 2020-10-24 VITALS — BP 110/74 | HR 79 | Ht 70.0 in | Wt 256.0 lb

## 2020-10-24 DIAGNOSIS — M12811 Other specific arthropathies, not elsewhere classified, right shoulder: Secondary | ICD-10-CM | POA: Diagnosis not present

## 2020-10-24 DIAGNOSIS — G8929 Other chronic pain: Secondary | ICD-10-CM

## 2020-10-24 DIAGNOSIS — M25511 Pain in right shoulder: Secondary | ICD-10-CM | POA: Diagnosis not present

## 2020-10-24 DIAGNOSIS — M25562 Pain in left knee: Secondary | ICD-10-CM | POA: Diagnosis not present

## 2020-10-24 DIAGNOSIS — M12812 Other specific arthropathies, not elsewhere classified, left shoulder: Secondary | ICD-10-CM

## 2020-10-24 DIAGNOSIS — G894 Chronic pain syndrome: Secondary | ICD-10-CM | POA: Diagnosis not present

## 2020-10-24 DIAGNOSIS — M25512 Pain in left shoulder: Secondary | ICD-10-CM | POA: Diagnosis not present

## 2020-10-24 DIAGNOSIS — M1712 Unilateral primary osteoarthritis, left knee: Secondary | ICD-10-CM | POA: Diagnosis not present

## 2020-10-24 NOTE — Patient Instructions (Addendum)
[facility] will call you Pain Management will call you See me in 10-12 weeks

## 2020-10-24 NOTE — Progress Notes (Signed)
[name] [name] Phone: [phone] Subjective:   [name], am serving as a scribe for Dr. Hulan [name]. This visit occurred during the SARS-CoV-2 public health emergency.  Safety protocols were in place, including screening questions prior to the visit, additional usage of staff PPE, and extensive cleaning of exam room while observing appropriate contact time as indicated for disinfecting solutions.   I'm seeing this patient by the request  of:  Zachary Borg, MD  CC: Bilateral shoulder, knee pain follow-up  [id number]   08/23/2020 Bilateral injections given again today.  Tolerated the procedure well.  Patient is taking a very low dose of prednisone.  Encouraged him to talk to primary care provider on this.  Patient is continuing to stay fairly active.  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  New x-rays to further evaluate the arthropathy aspect of the shoulder.  Patient does have some underlying what appears to be chronic pain.  Patient wants to avoid any type of surgical intervention of course if possible.  Also wants to avoid any pain medicines with patient having difficulty in the past.  Follow-up with me again in 2 months patient is on blood thinner which makes medications difficult.  Patient has many other comorbidities and social determinants of health.  Patient was warned about the and constant use of the steroids.  States this is the only thing that is giving him improvement in activities of daily living.  Update 10/24/2020 URI [name] is a 65 y.o. male coming in with complaint of bilateral shoulder pain. States that his right shoulder pain increased due to a fall late December. States that he would like a left knee injection as well today for pain.  Patient has known severe arthritic changes of the knee.  Feels like he is compensating for the contralateral knee.  Patient states that  it is giving her more instability.  Having difficulty even going to the grocery store now secondary to the amount of time.  Bilateral shoulder pain.  Known to have rotator cuff arthropathy.  Patient's x-rays did show the patient does have a bony overgrowth of the clavicle and moderate glenohumeral and acromioclavicular arthritis bilaterally.  Patient did fall and we did hit the right side.  Patient states over the course the last month has been doing a little better but still affecting daily activities such as even dressing himself.  Finding activities of daily living more and more difficult.    Past Medical History:  Diagnosis Date  . AICD (automatic cardioverter/defibrillator) present   . Anemia    supposed to be taking Vit B but doesn't  . ANXIETY    takes Xanax nightly  . Arthritis   . Asthma    Albuterol prn and Advair daily;also takes Prednisone daily  . Atrial fibrillation ([place]) 09/06/2015  . Cardiomyopathy [facility]    a. EF 25% TEE July 2013; b. EF normalized 2015;  c. 03/2015 Echo: EF 40-45%, difrf HK, PASP 38 mmHg, Mild MR, sev LAE/RAE.  [name] CHF (congestive heart failure) ([place])   . Chronic constipation    takes OTC stool softener  . COPD (chronic obstructive pulmonary disease) ([place])    "one dr says COPD; one dr says emphysema" (09/18/2017)  . DEPRESSION    takes Zoloft and Doxepin daily  . Diverticulitis   . DYSKINESIA, ESOPHAGUS   . Emphysema of lung [facility])    "one dr says  COPD; one dr says emphysema" (09/18/2017)  . Essential hypertension       . FIBROMYALGIA   . GERD (gastroesophageal reflux disease)       . Glaucoma   . HYPERLIPIDEMIA    a. Intolerant to statins.  . INSOMNIA    takes Ambien nightly  . Myocardial infarction [facility])    a. 2012 Myoview notable for prior infarct;  b. 03/2015 Lexiscan CL: EF 37%, diff HK, small area of inferior infarct from apex to base-->Med Rx.  . Myocardial infarction ([place])   . O2 dependent    "2.5L q hs & prn" (09/18/2017)  . Paroxysmal  atrial fibrillation (H[facility])    a. CHA2DS2VASc = 3--> takes Coumadin;  b. 03/15/2015 Successful TEE/DCCV;  c. 03/2015 recurrent afib, Amio d/c'd in setting of hyperthyroidism.  . Peripheral neuropathy   . Pneumonia 12/2016  . Rash and other nonspecific skin eruption 04/12/2009   [name] cause found saw dermatologists x 2 and allergist  . SLEEP APNEA, OBSTRUCTIVE    a. doesn't use CPAP  . Syncope    a. 03/2015 s/p MDT LINQ.  [name] Type II diabetes mellitus ([place])        Past Surgical History:  Procedure Laterality Date  . ACNE CYST REMOVAL     2 on back   . AV NODE ABLATION N/A 10/25/2017   Procedure: AV NODE ABLATION;  Surgeon: Deboraha Sprang, MD;  Location: [place] CV LAB;  Service: Cardiovascular;  Laterality: N/A;  . BIV ICD INSERTION CRT-D N/A 09/18/2017   Procedure: BIV ICD INSERTION CRT-D;  Surgeon: Deboraha Sprang, MD;  Location: [place] CV LAB;  Service: Cardiovascular;  Laterality: N/A;  . CARDIAC CATHETERIZATION N/A 03/21/2016   Procedure: Right/Left Heart Cath and Coronary Angiography;  Surgeon: Larey Dresser, MD;  Location: [place] CV LAB;  Service: Cardiovascular;  Laterality: N/A;  . CARDIOVERSION  04/18/2012   Procedure: CARDIOVERSION;  Surgeon: Fay Records, MD;  Location: [place];  Service: Cardiovascular;  Laterality: N/A;  . CARDIOVERSION  04/25/2012   Procedure: CARDIOVERSION;  Surgeon: Thayer Headings, MD;  Location: [place];  Service: Cardiovascular;  Laterality: N/A;  . CARDIOVERSION  04/25/2012   Procedure: CARDIOVERSION;  Surgeon: Fay Records, MD;  Location: [place];  Service: Cardiovascular;  Laterality: N/A;  . CARDIOVERSION  05/09/2012   Procedure: CARDIOVERSION;  Surgeon: Sherren Mocha, MD;  Location: [place];  Service: Cardiovascular;  Laterality: N/A;  changed from crenshaw to cooper by trish/leone-endo  . CARDIOVERSION N/A 03/15/2015   Procedure: CARDIOVERSION;  Surgeon: Thayer Headings, MD;  Location: [facility] ENDOSCOPY;  Service: Cardiovascular;  Laterality: N/A;  .  COLONOSCOPY    . COLONOSCOPY WITH PROPOFOL N/A 10/21/2014   Procedure: COLONOSCOPY WITH PROPOFOL;  Surgeon: Ladene Artist, MD;  Location: [facility] ENDOSCOPY;  Service: Endoscopy;  Laterality: N/A;  . EP IMPLANTABLE DEVICE N/A 04/06/2015   Procedure: Loop Recorder Insertion;  Surgeon: Evans Lance, MD;  Location: [place] CV LAB;  Service: Cardiovascular;  Laterality: N/A;  . ESOPHAGOGASTRODUODENOSCOPY    . JOINT REPLACEMENT    . LOOP RECORDER REMOVAL N/A 09/18/2017   Procedure: LOOP RECORDER REMOVAL;  Surgeon: Deboraha Sprang, MD;  Location: [name] CV LAB;  Service: Cardiovascular;  Laterality: N/A;  . RIGHT/LEFT HEART CATH AND CORONARY ANGIOGRAPHY N/A 01/28/2017   Procedure: Right/Left Heart Cath and Coronary Angiography;  Surgeon: Larey Dresser, MD;  Location: [place] CV LAB;  Service: Cardiovascular;  Laterality: N/A;  .  TEE WITHOUT CARDIOVERSION  04/25/2012   Procedure: TRANSESOPHAGEAL ECHOCARDIOGRAM (TEE);  Surgeon: Thayer Headings, MD;  Location: [place];  Service: Cardiovascular;  Laterality: N/A;  . TEE WITHOUT CARDIOVERSION N/A 03/15/2015   Procedure: TRANSESOPHAGEAL ECHOCARDIOGRAM (TEE);  Surgeon: Thayer Headings, MD;  Location: [place];  Service: Cardiovascular;  Laterality: N/A;  . TONSILLECTOMY AND ADENOIDECTOMY    . TOTAL KNEE ARTHROPLASTY Right 06/15/2014   Procedure: TOTAL KNEE ARTHROPLASTY;  Surgeon: Renette Butters, MD;  Location: [place];  Service: Orthopedics;  Laterality: Right;   Social History   Socioeconomic History  . Marital status: Divorced    Spouse name: Not on file  . Number of children: 2  . Years of education: Not on file  . Highest education level: Not on file  Occupational History  . Occupation: retired/disabled. prev worked in Therapist, sports.    Employer: DISABLED  Tobacco Use  . Smoking status: Former Smoker    Packs/day: 2.00    Years: 30.00    Pack years: 60.00    Types: Cigarettes    Quit date: 10/16/2007    Years since  quitting: 13.0  . Smokeless tobacco: Never Used  Vaping Use  . Vaping Use: Never used  Substance and Sexual Activity  . Alcohol use: [name]  . Drug use: [name]  . Sexual activity: Not Currently  Other Topics Concern  . Not on file  Social History Narrative   Lives alone.   Social Determinants of Health   Financial Resource Strain: Not on file  Food Insecurity: Not on file  Transportation Needs: Not on file  Physical Activity: Not on file  Stress: Not on file  Social Connections: Not on file   Allergies  Allergen Reactions  . Amiodarone Other (See Comments)    hyperthyroidism  . Statins Other (See Comments)    myalgia  . Tape Other (See Comments)    Skin Tears Use Paper Tape Only   Family History  Problem Relation Age of Onset  . COPD Mother   . Asthma Mother   . Colon polyps Mother   . Allergies Mother   . Hypothyroidism Mother   . Asthma Maternal Grandmother   . Colon cancer Neg Hx      Current Outpatient Medications (Cardiovascular):  .  carvedilol (COREG) 12.5 MG tablet, Take 1 tablet (12.5 mg total) by mouth 2 (two) times daily with a meal. .  digoxin (LANOXIN) 0.25 MG tablet, TAKE 1/2 TABLET BY MOUTH EVERY DAY .  sacubitril-valsartan (ENTRESTO) 24-26 MG, Take 1 tablet by mouth 2 (two) times daily. [name]  spironolactone (ALDACTONE) 25 MG tablet, TAKE 1 TABLET BY MOUTH EVERY DAY .  torsemide (DEMADEX) 20 MG tablet, Take 3 tablets (60 mg total) by mouth 2 (two) times daily.  Current Outpatient Medications (Respiratory):  .  albuterol (VENTOLIN HFA) 108 (90 Base) MCG/ACT inhaler, INHALE 2 PUFFS 4 TIMES A DAY as needed .  fluticasone (FLONASE) 50 MCG/ACT nasal spray, PLACE 2 SPRAYS INTO BOTH NOSTRILS DAILY AS NEEDED FOR ALLERGIES OR RHINITIS.  Current Outpatient Medications (Analgesics):  .  acetaminophen (TYLENOL) 500 MG tablet, Take 500 mg by mouth every 6 (six) hours as needed. .  traMADol (ULTRAM) 50 MG tablet, TAKE 1 TABLET (50 MG TOTAL) BY MOUTH DAILY AS  NEEDED.  Current Outpatient Medications (Hematological):  .  warfarin (COUMADIN) 10 MG tablet, Take 10 mg by mouth daily. Mon,wed,fri,sat .  warfarin (COUMADIN) 5 MG tablet, Take 5 mg by mouth daily. Sun,Tues, Thursday  Current Outpatient Medications (Other):  [name]  ALPRAZolam (XANAX) 0.5 MG tablet, TAKE 2 TABLETS BY MOUTH AT BEDTIME .  carisoprodol (SOMA) 350 MG tablet, TAKE 1 TABLET BY MOUTH THREE TIMES A DAY AS NEEDED FOR MUSCLE SPASMS .  doxepin (SINEQUAN) 10 MG capsule, TAKE 2 CAPSULES BY MOUTH AT BEDTIME. ANNUAL APPT DUE IN JUNE MUST SEE PROVIDER FOR FUTURE REFILLS .  hydrOXYzine (ATARAX/VISTARIL) 10 MG tablet, TAKE 1 TABLET (10 MG TOTAL) BY MOUTH 3 (THREE) TIMES DAILY AS NEEDED FOR ITCHING. .  lidocaine (LIDODERM) 5 %, APPLY 1 PATCH TO AFFECTED AREA FOR UP TO 12 HOURS . DO NOT USE MORE THAN ONE PATCH IN 24 HOURS. .  methotrexate (RHEUMATREX) 2.5 MG tablet, Take 2.5 mg by mouth once a week. Caution:Chemotherapy. Protect from light. 7 tablets .  omeprazole (PRILOSEC) 20 MG capsule, TAKE 1 CAPSULE BY MOUTH TWICE A DAY .  tamsulosin (FLOMAX) 0.4 MG CAPS capsule,  .  traZODone (DESYREL) 100 MG tablet, TAKE 1 TABLET BY MOUTH EVERY DAY AT BEDTIME AS NEEDED .  TUMS 500 MG chewable tablet, Chew 500-2,000 mg by mouth every 4 (four) hours as needed for indigestion or heartburn.  .  valACYclovir (VALTREX) 1000 MG tablet, TAKE 1 TABLET BY MOUTH THREE TIMES A DAY .  venlafaxine XR (EFFEXOR-XR) 75 MG 24 hr capsule, TAKE 1 CAPSULE BY MOUTH DAILY WITH BREAKFAST. [name]  zolpidem (AMBIEN) 10 MG tablet, 1 by mouth at bedtime as needed   Reviewed prior external information including notes and imaging from  primary care provider As well as notes that were available from care everywhere and other healthcare systems.  Past medical history, social, surgical and family history all reviewed in electronic medical record.  [name] pertanent information unless stated regarding to the chief complaint.   Review of Systems:   [name] headache, visual changes, nausea, vomiting, diarrhea, constipation, dizziness, abdominal pain, skin rash, fevers, chills, night sweats, weight loss, swollen lymph nodes, body aches, joint swelling, chest pain, shortness of breath, mood changes. POSITIVE muscle aches  Objective  Blood pressure 110/74, pulse 79, height 5\' 10"  (1.778 m), weight 256 lb (116.1 kg), SpO2 98 %.   General: [name] apparent distress alert and oriented x3 mood and affect normal, dressed appropriately.  HEENT: Pupils equal, extraocular movements intact  Respiratory: Patient's speak in full sentences and does not appear short of breath  Bilateral shoulder show some mild atrophy of the musculature bilaterally.  Crepitus noted of the right shoulder.  Patient does have positive impingement.  Rotator cuff strength is only 4 out of 5 and symmetric to contralateral side.  Limited range of motion in all planes of 5 to 10 degrees  Knee: Left valgus de formity noted.  Abnormal thigh to calf ratio.  Tender to palpation over medial and PF joint line.  ROM full in flexion and extension and lower leg rotation. instability with valgus force.  painful patellar compression. Patellar glide with moderate crepitus. Patellar and quadriceps tendons unremarkable. Hamstring and quadriceps strength is normal. Contralateral knee shows replacement and does have some mild instability  After informed written and verbal consent, patient was seated on exam table. Left knee was prepped with alcohol swab and utilizing anterolateral approach, patient's left knee space was injected with 4:1  marcaine 0.5%: Kenalog 40mg /dL. Patient tolerated the procedure well without immediate complications.  Procedure: Real-time Ultrasound Guided Injection of right glenohumeral joint Device: GE Logiq Q7  Ultrasound guided injection is preferred based studies that show increased duration, increased effect, greater  accuracy, decreased procedural pain, increased response  rate with ultrasound guided versus blind injection.  Verbal informed consent obtained.  Time-out conducted.  Noted [name] overlying erythema, induration, or other signs of local infection.  Skin prepped in a sterile fashion.  Local anesthesia: Topical Ethyl chloride.  With sterile technique and under real time ultrasound guidance:  Joint visualized.  23g 1  inch needle inserted posterior approach. Pictures taken for needle placement. Patient did have injection of, 2 cc of 0.5% Marcaine, and 1.0 cc of Kenalog 40 mg/dL. Completed without difficulty  Pain immediately improved suggesting accurate placement of the medication.  Advised to call if fevers/chills, erythema, induration, drainage, or persistent bleeding.  Impression: Technically successful ultrasound guided injection.   Impression and Recommendations:     The above documentation has been reviewed and is accurate and complete Lyndal Pulley, DO

## 2020-10-24 NOTE — Assessment & Plan Note (Signed)
Patient does have significant

## 2020-10-24 NOTE — Assessment & Plan Note (Signed)
Patient having worsening symptoms and instability.  Walking with the aid of a cane.  Has not done bracing previously.  Patient has failed all conservative therapy and does have severe osteoarthritic changes of the knee.  Encouraged him to talk to orthopedic surgery to discuss the possibility of replacement.  Patient does not want to do this and does not know if they will do it secondary to his other comorbidities.  We discussed if this continues to give him trouble and are unable to make him a surgical candidate we would consider doing the injections every 10 to 12 weeks patient is in agreement with the plan and will follow up at that time

## 2020-10-24 NOTE — Assessment & Plan Note (Signed)
Patient has significant amount of different comorbidities that does make patient's chronic pain challenging.  Patient is now on digoxin as well and I feel we are capped on any type of medications that we can do and patient will be referred to pain management to discuss further treatment options.  Patient is in agreement with the plan.  Patient is very appreciative of all the care that he has received and understands that this is difficult.  Patient follow-up with me again in 10 to 12 weeks if we do continue the injections.

## 2020-10-25 ENCOUNTER — Ambulatory Visit (INDEPENDENT_AMBULATORY_CARE_PROVIDER_SITE_OTHER): Payer: Medicare HMO | Admitting: General Practice

## 2020-10-25 ENCOUNTER — Telehealth: Payer: Self-pay | Admitting: Pharmacist

## 2020-10-25 DIAGNOSIS — E7849 Other hyperlipidemia: Secondary | ICD-10-CM

## 2020-10-25 DIAGNOSIS — Z7901 Long term (current) use of anticoagulants: Secondary | ICD-10-CM | POA: Diagnosis not present

## 2020-10-25 LAB — POCT INR: INR: 4.4 — AB (ref 2.0–3.0)

## 2020-10-25 NOTE — Progress Notes (Signed)
Medical screening examination/treatment/procedure(s) were performed by non-physician practitioner and as supervising physician I was immediately available for consultation/collaboration. I agree with above. Britiany Silbernagel, MD   

## 2020-10-25 NOTE — Telephone Encounter (Signed)
Left HIPAA compliant voicemail requesting call back regarding hyperlipidemia management. Patient with recent lipid panel results, therefore no need to check another lipid panel scheduled in 2 days. Additionally, planned to informed patient that [place] copay assistance program has reopened for enrollment of new patients to assist with PCSK9-inhibitor copay cost. Will call patient again in 1 week to discuss initiating [place] or Praluent.

## 2020-10-25 NOTE — Patient Instructions (Addendum)
Pre visit review using our clinic review tool, if applicable. No additional management support is needed unless otherwise documented below in the visit note.  Hold coumadin today and tomorrow and then change dosage and take 1/2 tablet daily except take 1 tablet on Mondays and Friday. Re-check INR in 2 to 3 weeks.  Patient advised to stop taking ibuprofen.

## 2020-10-26 ENCOUNTER — Telehealth: Payer: Self-pay | Admitting: Pharmacist

## 2020-10-26 MED ORDER — REPATHA SURECLICK 140 MG/ML ~~LOC~~ SOAJ: 1.0000 "pen " | SUBCUTANEOUS | 3 refills | Status: DC

## 2020-10-26 NOTE — Telephone Encounter (Signed)
Called pt again, no answer.

## 2020-10-26 NOTE — Telephone Encounter (Signed)
Called pt to follow up with lipids. LDL 162 on lipid panel from last week, above goal < 70 given hx of ASCVD. He is intolerant to atorvastatin 20-40mg  daily, rosuvastatin 5mg  daily, pravastatin 10mg  daily, and ezetimibe.   He was seen in lipid clinic 07/2020 but PCSK9i was cost prohibitive and [employer] grant was closed at that time. [facility]ima [facility] has since reopened so that pt can start Repatha for free. Spoke with pt who is agreeable with this plan. Prior authorization has been submitted and approved through 04/24/21, [place] has been approved and info called to pharmacy.  Pt will call clinic once he has picked up his Repatha so that we can coordinate a time for Repatha review/injection training.

## 2020-10-27 ENCOUNTER — Other Ambulatory Visit: Payer: Medicare HMO

## 2020-10-27 ENCOUNTER — Other Ambulatory Visit: Payer: Medicare HMO | Admitting: Orthotics

## 2020-10-27 ENCOUNTER — Ambulatory Visit: Payer: Medicare HMO | Admitting: Podiatry

## 2020-10-28 DIAGNOSIS — L2089 Other atopic dermatitis: Secondary | ICD-10-CM | POA: Diagnosis not present

## 2020-10-28 NOTE — Progress Notes (Signed)
No ICM remote transmission received for 10/21/2020 and next ICM transmission scheduled for 12/05/2020.

## 2020-11-01 ENCOUNTER — Encounter (HOSPITAL_COMMUNITY): Payer: Medicare HMO

## 2020-11-01 NOTE — Progress Notes (Signed)
Opened in Error.

## 2020-11-04 NOTE — Telephone Encounter (Signed)
Left message for pt to see if he has picked up Repatha and wants to coordinate a time to stop by the office for first injection training.

## 2020-11-06 NOTE — Progress Notes (Signed)
error 

## 2020-11-07 ENCOUNTER — Other Ambulatory Visit: Payer: Self-pay

## 2020-11-07 ENCOUNTER — Telehealth (HOSPITAL_COMMUNITY): Payer: Self-pay | Admitting: Pharmacist

## 2020-11-07 ENCOUNTER — Ambulatory Visit (HOSPITAL_COMMUNITY)
Admission: RE | Admit: 2020-11-07 | Discharge: 2020-11-07 | Disposition: A | Discharge status: Home / Self Care | Payer: Medicare HMO | Source: Ambulatory Visit | Attending: Cardiology | Admitting: Cardiology

## 2020-11-07 VITALS — BP 121/83 | HR 75 | Wt 267.0 lb

## 2020-11-07 DIAGNOSIS — J449 Chronic obstructive pulmonary disease, unspecified: Secondary | ICD-10-CM | POA: Diagnosis not present

## 2020-11-07 DIAGNOSIS — Z87891 Personal history of nicotine dependence: Secondary | ICD-10-CM | POA: Insufficient documentation

## 2020-11-07 DIAGNOSIS — Z8616 Personal history of COVID-19: Secondary | ICD-10-CM | POA: Insufficient documentation

## 2020-11-07 DIAGNOSIS — Z7901 Long term (current) use of anticoagulants: Secondary | ICD-10-CM | POA: Diagnosis not present

## 2020-11-07 DIAGNOSIS — I4821 Permanent atrial fibrillation: Secondary | ICD-10-CM | POA: Insufficient documentation

## 2020-11-07 DIAGNOSIS — I2510 Atherosclerotic heart disease of native coronary artery without angina pectoris: Secondary | ICD-10-CM | POA: Diagnosis not present

## 2020-11-07 DIAGNOSIS — I428 Other cardiomyopathies: Secondary | ICD-10-CM | POA: Insufficient documentation

## 2020-11-07 DIAGNOSIS — G4733 Obstructive sleep apnea (adult) (pediatric): Secondary | ICD-10-CM | POA: Insufficient documentation

## 2020-11-07 DIAGNOSIS — Z79899 Other long term (current) drug therapy: Secondary | ICD-10-CM | POA: Diagnosis not present

## 2020-11-07 DIAGNOSIS — Z7984 Long term (current) use of oral hypoglycemic drugs: Secondary | ICD-10-CM | POA: Insufficient documentation

## 2020-11-07 DIAGNOSIS — I442 Atrioventricular block, complete: Secondary | ICD-10-CM | POA: Diagnosis not present

## 2020-11-07 DIAGNOSIS — I4819 Other persistent atrial fibrillation: Secondary | ICD-10-CM | POA: Diagnosis not present

## 2020-11-07 DIAGNOSIS — S81812D Laceration without foreign body, left lower leg, subsequent encounter: Secondary | ICD-10-CM

## 2020-11-07 DIAGNOSIS — Z95810 Presence of automatic (implantable) cardiac defibrillator: Secondary | ICD-10-CM | POA: Insufficient documentation

## 2020-11-07 DIAGNOSIS — Z9981 Dependence on supplemental oxygen: Secondary | ICD-10-CM | POA: Insufficient documentation

## 2020-11-07 DIAGNOSIS — I5022 Chronic systolic (congestive) heart failure: Secondary | ICD-10-CM | POA: Insufficient documentation

## 2020-11-07 DIAGNOSIS — I5030 Unspecified diastolic (congestive) heart failure: Secondary | ICD-10-CM | POA: Insufficient documentation

## 2020-11-07 LAB — COMPREHENSIVE METABOLIC PANEL
ALT: 29 U/L (ref 0–44)
AST: 20 U/L (ref 15–41)
Albumin: 3.5 g/dL (ref 3.5–5.0)
Alkaline Phosphatase: 37 U/L — ABNORMAL LOW (ref 38–126)
Anion gap: 9 (ref 5–15)
BUN: 18 mg/dL (ref 8–23)
CO2: 23 mmol/L (ref 22–32)
Calcium: 8.9 mg/dL (ref 8.9–10.3)
Chloride: 108 mmol/L (ref 98–111)
Creatinine, Ser: 0.92 mg/dL (ref 0.61–1.24)
GFR, Estimated: >60 mL/min (ref >60)
Glucose, Bld: 99 mg/dL (ref 70–99)
Potassium: 4.9 mmol/L (ref 3.5–5.1)
Sodium: 140 mmol/L (ref 135–145)
Total Bilirubin: 0.6 mg/dL (ref 0.3–1.2)
Total Protein: 6.2 g/dL — ABNORMAL LOW (ref 6.5–8.1)

## 2020-11-07 LAB — DIGOXIN LEVEL: Digoxin Level: 0.7 ng/mL — ABNORMAL LOW (ref 0.8–2.0)

## 2020-11-07 MED ORDER — TORSEMIDE 20 MG PO TABS: 80.0000 mg | ORAL_TABLET | Freq: Two times a day (BID) | ORAL | 3 refills | Status: DC

## 2020-11-07 MED ORDER — DAPAGLIFLOZIN PROPANEDIOL 10 MG PO TABS: 10.0000 mg | ORAL_TABLET | Freq: Every day | ORAL | 11 refills | Status: DC

## 2020-11-07 MED ORDER — METOLAZONE 2.5 MG PO TABS: 2.5000 mg | ORAL_TABLET | ORAL | 0 refills | Status: DC

## 2020-11-07 MED ORDER — POTASSIUM CHLORIDE CRYS ER 20 MEQ PO TBCR: 20.0000 meq | EXTENDED_RELEASE_TABLET | Freq: Every day | ORAL | 3 refills | Status: DC

## 2020-11-07 MED ORDER — TORSEMIDE 20 MG PO TABS: ORAL_TABLET | ORAL | 3 refills | Status: DC

## 2020-11-07 NOTE — Telephone Encounter (Signed)
Patient wanted to let Jinny Blossom know that he was finally able to get the medication from [facility].

## 2020-11-07 NOTE — Patient Instructions (Signed)
INCREASE Torsemide to 80 mg (4 tabs) twice a day for two days then 80 mg in the AM and 60 mg in the PM  START Metolazone 2.5 mg, one tab 11/08/2020 30 mins before morning dose of Torsemide START Potassium 20 meq one tab daily START Farxiga 10 mg, one tab daily   Labs today We will only contact you if something comes back abnormal or we need to make some changes. Otherwise no news is good news!  Labs needed in 7-10 days  You have been referred to [facility] -they will be in touch with an appointment  Your physician recommends that you schedule a follow-up appointment in: 1 month with Dr [name] and echo  Your physician has requested that you have an echocardiogram. Echocardiography is a painless test that uses sound waves to create images of your heart. It provides your doctor with information about the size and shape of your heart and how well your heart's chambers and valves are working. This procedure takes approximately one hour. There are no restrictions for this procedure.  If you have any questions or concerns before your next appointment please send [place] a message through [place] or call our office at [phone].    TO LEAVE A MESSAGE FOR THE NURSE SELECT OPTION 2, PLEASE LEAVE A MESSAGE INCLUDING: . YOUR NAME . DATE OF BIRTH . CALL BACK NUMBER . REASON FOR CALL**this is important as we prioritize the call backs  YOU WILL RECEIVE A CALL BACK THE SAME DAY AS LONG AS YOU CALL BEFORE 4:00 PM

## 2020-11-07 NOTE — Progress Notes (Signed)
ID:  [name], DOB March 16, 1956, MRN [id number]  Provider location: [facility] Type of Visit: Established patient  PCP:  [name], MD  Cardiologist:  Dr. [name]   History of Present Illness: [name] is a 65 y.o. male who has history of COPD on home oxygen, permanent atrial fibrillation, and chronic systolic CHF.  Amiodarone and DCCV were tried for atrial fibrillation in the past without success.   Patient has been noted to have a low EF since 2013.  EF improved by to normal by 2015 but dropped to 30-35% by 11/16.  Cath in 6/17 showed some CAD but not enough to cause cardiomyopathy.  He was admitted in 3/18 with PNA, atrial fibrillation/RVR, and acute on chronic systolic CHF.  Echo in 3/18 showed fall in EF to 10-15%.   I took him for right and left heart cath in 4/18, which showed nonobstructive CAD and relatively optimized filling pressures.    Syncope in 11/18. Loop recorder showed 6 second pause and periods of complete heart block. He had Medtronic BiV ICD placed in 12/18.  In 1/19, he had AV nodal ablation to promote BiV pacing.   Echo was done 6/19 with EF 25-30% with moderate RV dilation/mildly decreased systolic function. CPX 7/19 was submaximal, probably mild functional impairment.    Echo in 1/21 showed EF 35-40% with diffuse hypokinesis, moderate RV dilation with mildly decreased systolic function.   He was admitted with COVID-19 PNA in 2/19.  He had AKI/hypotension and Entresto was stopped.   After last appointment, he was supposed to restart Entresto.  He took it for a month then did not get it refilled.  He did not have any problems with it.  He has had myalgias now with multiple statins and is off cholesterol meds.  He has just recently obtained coverage for Repatha.  Patient returns for followup of CHF.   At last appointment, he was volume overloaded and torsemide was increased. However, weight is up 7 lbs.  He is still short of breath with  walking about 50-100 feet.  Main complaint is still joint pain (knees and back).  No chest pain.  No orthopnea/PND.  He has a non-healing left lower leg ulceration (has been present since November).   Medtronic device interrogation: >99% BiV pacing, fluid index > threshold with decreased impedance, always in atrial fibrillation.  REDS clip 35%.    Labs (4/18): pro-BNP 904, K 3.1, creatinine 1.39 => 1.08 with BUN 66 => 37, hgb 14.1.  Labs (5/18): hemoglobin 13.1 Labs (6/18): K 4, creatinine 1.3, BNP 294 Labs (7/18): K 3.8, creatinine 1.23, LDL 96, HDL 42 Labs (8/18): K 4.5, creatinine 1.22 Labs (10/18): digoxin 0.4 Labs (12/18): LDL 115, HDL 37, TGs 214, K 4.2, creatinine 0.84 Labs (1/19): K 4.4, creatinine 0.75, pro-BNP 433 Labs (2/19): digoxin 0.4 Labs (3/19): LDL 121, hgb 12.8, K 4.5, creatinine 0.98 Labs (6/19): LDL 48, HDL 45, K 5, creatinine 0.94, digoxin 0.3 Labs (9/19): K 3.7, creatinine 1.09 => 1.32, digoxin 0.2 Labs (11/19): LDL 115 Labs (12/20): K 3.9, creatinine 0.87, LDL 94  Labs (3/21): K 4, creatinine 1.08 Labs (6/21): LDL 109 Labs (7/21): K 3.7, creatinine 1.15 Labs (12/21): LDL 129, TGs 239, hgb 12.9, K 3.9, creatinine 0.98, digoxin 0.8 Labs (1/22): ALT 76, AST 40, LDL 162, K 3.8, creatinine 1.16  PMH: 1. Asthma 2. COPD: On home oxygen. Prior smoker.  3. Atrial fibrillation: Permanent.  He failed DCCV and amiodarone  in the past . 4. HTN 5. GERD 6. Hyperlipidemia: Myalgias with atorvastatin 7. BPH.  8. Chronic systolic CHF: Nonischemic cardiomyopathy.  - EF 25% by TEE in 7/13.  Normalized on 2015 echo. - Echo (11/16): EF 30-35%.  - LHC/RHC (6/17): 80% ostial stenosis small D1, 30-40% pLAD.  Mean RA 4, PA 26/10, mean PCWP 11, CI 2.33.  - Echo (3/18): EF 10-15%, diffuse hypokinesis, severe LV dilation, moderate RV dilation with severely decreased systolic function.  - LHC/RHC (4/18): 40% proximal LAD.  Mean RA 8, PA 37/10 mean 22, mean PCWP 22, LVEDP 9, CI 2.39. -  Medtronic BiV ICD placement in 12/18 with AV nodal ablation to promote BiV pacing in 1/19.  - Echo (6/19): EF 25-30% with mild LV dilation, moderately dilated RV with mildly decreased systolic function, IVC not dilated, PASP 23 mmHg.  - CPX (7/19): peak VO2 17.6 (78% predicted), VE/VCO2 slope 31, RER 1.02 => submaximal, probably mild functional impairment.  - Echo (1/21): EF 35-40% with diffuse hypokinesis, moderate RV dilation with mildly decreased systolic function. 9. Morbid obesity 10. ILR in place.  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 Complete heart block: s/p Medtronic CRT-D.  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 OSA: Untreated, unable to tolerate CPAP.  15. COVID-19 PNA 2/21.  16. Hyperlipidemia: Myalgias with multiple statins.  17. PAD: ABIs 10/21 noncompressible on right but TBI normal, normal ABI and TBI on left.   Social History   Socioeconomic History  . Marital status: Divorced    Spouse name: Not on file  . Number of children: 2  . Years of education: Not on file  . Highest education level: Not on file  Occupational History  . Occupation: retired/disabled. prev worked in Therapist, sports.    Employer: DISABLED  Tobacco Use  . Smoking status: Former Smoker    Packs/day: 2.00    Years: 30.00    Pack years: 60.00    Types: Cigarettes    Quit date: 10/16/2007    Years since quitting: 13.0  . Smokeless tobacco: Never Used  Vaping Use  . Vaping Use: Never used  Substance and Sexual Activity  . Alcohol use: No  . Drug use: No  . Sexual activity: Not Currently  Other Topics Concern  . Not on file  Social History Narrative   Lives alone.   Social Determinants of Health   Financial Resource Strain: Not on file  Food Insecurity: Not on file  Transportation Needs: Not on file  Physical Activity: Not on file  Stress: Not on file  Social Connections: Not on file  Intimate Partner Violence: Not on file   Family History  Problem Relation Age of Onset  . COPD Mother    . Asthma Mother   . Colon polyps Mother   . Allergies Mother   . Hypothyroidism Mother   . Asthma Maternal Grandmother   . Colon cancer Neg Hx    ROS: All systems reviewed and negative except as per HPI.   Current Outpatient Medications  Medication Sig Dispense Refill  . acetaminophen (TYLENOL) 500 MG tablet Take 500 mg by mouth every 6 (six) hours as needed.    [name] albuterol (VENTOLIN HFA) 108 (90 Base) MCG/ACT inhaler INHALE 2 PUFFS 4 TIMES A DAY as needed 54 g 3  . ALPRAZolam (XANAX) 0.5 MG tablet TAKE 2 TABLETS BY MOUTH AT BEDTIME 60 tablet 2  . carisoprodol (SOMA) 350 MG tablet TAKE 1 TABLET BY MOUTH THREE TIMES A DAY AS NEEDED FOR MUSCLE SPASMS 90  tablet 5  . carvedilol (COREG) 12.5 MG tablet Take 1 tablet (12.5 mg total) by mouth 2 (two) times daily with a meal. 180 tablet 3  . dapagliflozin propanediol (FARXIGA) 10 MG TABS tablet Take 1 tablet (10 mg total) by mouth daily before breakfast. 30 tablet 11  . digoxin (LANOXIN) 0.25 MG tablet TAKE 1/2 TABLET BY MOUTH EVERY DAY 45 tablet 3  . doxepin (SINEQUAN) 10 MG capsule TAKE 2 CAPSULES BY MOUTH AT BEDTIME. ANNUAL APPT DUE IN JUNE MUST SEE PROVIDER FOR FUTURE REFILLS 180 capsule 1  . Evolocumab (REPATHA SURECLICK) 299 MG/ML SOAJ Inject 1 pen into the skin every 14 (fourteen) days. 6 mL 3  . fluticasone (FLONASE) 50 MCG/ACT nasal spray PLACE 2 SPRAYS INTO BOTH NOSTRILS DAILY AS NEEDED FOR ALLERGIES OR RHINITIS. 48 mL 1  . hydrOXYzine (ATARAX/VISTARIL) 10 MG tablet TAKE 1 TABLET (10 MG TOTAL) BY MOUTH 3 (THREE) TIMES DAILY AS NEEDED FOR ITCHING. 60 tablet 2  . lidocaine (LIDODERM) 5 % APPLY 1 PATCH TO AFFECTED AREA FOR UP TO 12 HOURS . DO NOT USE MORE THAN ONE PATCH IN 24 HOURS. 90 patch 2  . methotrexate (RHEUMATREX) 2.5 MG tablet Take 2.5 mg by mouth once a week. Caution:Chemotherapy. Protect from light. 7 tablets    . metolazone (ZAROXOLYN) 2.5 MG tablet Take 1 tablet (2.5 mg total) by mouth as directed. 5 tablet 0  . omeprazole  (PRILOSEC) 20 MG capsule TAKE 1 CAPSULE BY MOUTH TWICE A DAY 180 capsule 1  . potassium chloride SA (KLOR-CON) 20 MEQ tablet Take 1 tablet (20 mEq total) by mouth daily. 90 tablet 3  . sacubitril-valsartan (ENTRESTO) 24-26 MG Take 1 tablet by mouth 2 (two) times daily. 180 tablet 3  . spironolactone (ALDACTONE) 25 MG tablet TAKE 1 TABLET BY MOUTH EVERY DAY 90 tablet 3  . tamsulosin (FLOMAX) 0.4 MG CAPS capsule     . traMADol (ULTRAM) 50 MG tablet TAKE 1 TABLET (50 MG TOTAL) BY MOUTH DAILY AS NEEDED. 30 tablet 5  . traZODone (DESYREL) 100 MG tablet TAKE 1 TABLET BY MOUTH EVERY DAY AT BEDTIME AS NEEDED 90 tablet 1  . TUMS 500 MG chewable tablet Chew 500-2,000 mg by mouth every 4 (four) hours as needed for indigestion or heartburn.     . valACYclovir (VALTREX) 1000 MG tablet TAKE 1 TABLET BY MOUTH THREE TIMES A DAY 21 tablet 2  . venlafaxine XR (EFFEXOR-XR) 75 MG 24 hr capsule TAKE 1 CAPSULE BY MOUTH DAILY WITH BREAKFAST. 90 capsule 1  . warfarin (COUMADIN) 10 MG tablet Take 10 mg by mouth daily. Mon,wed,fri,sat    . warfarin (COUMADIN) 5 MG tablet Take 5 mg by mouth daily. Sun,Tues, Thursday    . zolpidem (AMBIEN) 10 MG tablet 1 by mouth at bedtime as needed 90 tablet 1  . torsemide (DEMADEX) 20 MG tablet Take 4 tablets (80 mg total) by mouth in the morning AND 3 tablets (60 mg total) every evening. 240 tablet 3   No current facility-administered medications for this encounter.   BP 121/83   Pulse 75   Wt 121.1 kg (267 lb)   SpO2 97%   BMI 38.31 kg/m  General: NAD Neck: JVP 8-9 cm, no thyromegaly or thyroid nodule.  Lungs: Clear to auscultation bilaterally with normal respiratory effort. CV: Nondisplaced PMI.  Heart regular S1/S2, no S3/S4, no murmur.  1+ edema 1/2 to knees bilaterally.  No carotid bruit.  Difficult to palpate pedal pulses.  Abdomen: Soft, nontender, no  hepatosplenomegaly, no distention.  Skin: Intact without lesions or rashes.  Neurologic: Alert and oriented x 3.  Psych:  Normal affect. Extremities: No clubbing or cyanosis. Ulceration left lower leg.  HEENT: Normal.   Assessment/Plan:  1. Atrial fibrillation: Permanent.  Now s/p AV nodal ablation to allow effective BiV pacing.  - Continue warfarin (cannot afford DOACs).  2. COPD: No longer smoking.  Not using oxygen.  3. Chronic systolic CHF: Nonischemic cardiomyopathy based on 6/17 cath. However, he had a recent significant fall in EF from 30-35% to 10-15% on echo from 3/18.  LHC/RHC in 4/18 showed nonobstructive CAD and relatively optimized filling pressures.  He now has Medtronic CRT-D device with AV nodal ablation to allow BiV pacing.  Echo 6/19 with EF 25-30% with mildly decreased RV systolic function. CPX 7/19 was submaximal but likely only mild functional limitation from CHF.  Echo in 1/21 showed EF 35-40%.  NYHA class III symptoms, still worse than baseline. He did not respond particularly well to recent increase in torsemide. REDS clip not particularly high but volume overloaded on exam and also by Optivol.  Weight is up again.  - Continue Coreg 12.5 mg bid.   - Continue spironolactone 25 mg daily.        - Continue Entresto 24/26 bid.   - Increase torsemide to 80 mg bid x 2 days then 80 qam/60 qpm.  He will take a dose of metolazone 2.5 x 1 with tomorrow morning's torsemide (1 dose for now).  BMET today and again in 1 week.  - Add KCl 20 daily. - Continue digoxin 0.125 daily, check level.  - Start Farxiga 10 mg daily.  - I will arrange for echo.  4. CAD: Nonobstructive on 4/18 cath.  No exertional chest pain.  - He is on warfarin so no ASA.  - Statin myalgias, just got approved for Repatha and will start it soon => check lipids in 2 months.    5. Complete heart block: Now has Medtronic CRT-D device and s/p AV nodal ablation.  6. Rash: Spongiotic dermatitis.  Improved.  7. OSA: Unable to tolerate CPAP.   8. Elevated LFTs: ALT elevated on last labs, ?due to CHF.  - Repeat LFTs today.  9. LLE  wound/ulceration: Very slow to heal. ABIs not from 10/21 not remarkable.  - Will send to wound clinic due to slow healing.   Followup with me in 1 month.   Signed, Loralie Champagne, MD  11/07/2020  [place] [address] [facility] [place] [address] [phone] (office) [phone] (fax)

## 2020-11-07 NOTE — Telephone Encounter (Signed)
Spoke with pt. He is picking up Repatha this afternoon, states the pharmacy had not ordered it when he called them last Friday even though rx had been sent in 10 days prior.  He states he will call clinic if he has any trouble giving himself his first injection, but has a friend who can help. Advised pt he can have either Dr [name] recheck his lipid panel when he sees him in March, or his PCP can this summer at his annual appt. Pt will call clinic with any concerns.

## 2020-11-07 NOTE — Progress Notes (Signed)
ReDS Vest / Clip - 11/07/20 1000      ReDS Vest / Clip   Station Marker D    Ruler Value 36    ReDS Value Range Low volume    ReDS Actual Value 34

## 2020-11-07 NOTE — Telephone Encounter (Signed)
Advanced Heart Failure Patient Advocate Encounter   Patient was approved to receive Entresto from [employer].   Patient ID: [id number] Effective dates: 11/07/20 through 10/14/21  Audry Riles, PharmD, BCPS, BCCP, CPP Heart Failure Clinic Pharmacist [phone]

## 2020-11-08 ENCOUNTER — Ambulatory Visit: Payer: Medicare HMO

## 2020-11-08 ENCOUNTER — Encounter: Payer: Self-pay | Admitting: Physical Medicine & Rehabilitation

## 2020-11-14 ENCOUNTER — Ambulatory Visit (HOSPITAL_COMMUNITY)
Admission: RE | Admit: 2020-11-14 | Discharge: 2020-11-14 | Disposition: A | Discharge status: Home / Self Care | Payer: Medicare HMO | Source: Ambulatory Visit | Attending: Cardiology | Admitting: Cardiology

## 2020-11-14 ENCOUNTER — Encounter: Payer: Self-pay | Admitting: Podiatry

## 2020-11-14 ENCOUNTER — Other Ambulatory Visit: Payer: Medicare HMO | Admitting: Orthotics

## 2020-11-14 ENCOUNTER — Ambulatory Visit (INDEPENDENT_AMBULATORY_CARE_PROVIDER_SITE_OTHER): Payer: Medicare HMO | Admitting: Podiatry

## 2020-11-14 ENCOUNTER — Other Ambulatory Visit: Payer: Self-pay

## 2020-11-14 DIAGNOSIS — M2011 Hallux valgus (acquired), right foot: Secondary | ICD-10-CM

## 2020-11-14 DIAGNOSIS — L600 Ingrowing nail: Secondary | ICD-10-CM | POA: Diagnosis not present

## 2020-11-14 DIAGNOSIS — I5030 Unspecified diastolic (congestive) heart failure: Secondary | ICD-10-CM | POA: Insufficient documentation

## 2020-11-14 DIAGNOSIS — M21611 Bunion of right foot: Secondary | ICD-10-CM

## 2020-11-14 DIAGNOSIS — M2041 Other hammer toe(s) (acquired), right foot: Secondary | ICD-10-CM | POA: Diagnosis not present

## 2020-11-14 LAB — BASIC METABOLIC PANEL
Anion gap: 9 (ref 5–15)
BUN: 24 mg/dL — ABNORMAL HIGH (ref 8–23)
CO2: 23 mmol/L (ref 22–32)
Calcium: 8.7 mg/dL — ABNORMAL LOW (ref 8.9–10.3)
Chloride: 103 mmol/L (ref 98–111)
Creatinine, Ser: 1.12 mg/dL (ref 0.61–1.24)
GFR, Estimated: >60 mL/min (ref >60)
Glucose, Bld: 93 mg/dL (ref 70–99)
Potassium: 4.6 mmol/L (ref 3.5–5.1)
Sodium: 135 mmol/L (ref 135–145)

## 2020-11-14 NOTE — Progress Notes (Signed)
  Subjective:  Patient ID: [name], male    DOB: 1955/11/03,  MRN: [id number]  Chief Complaint  Patient presents with  . routine foot care    Right hallux nail is painful     65 y.o. male returns with the above complaint. History confirmed with patient.  He is having pain in the toe still. Most seems to be centered around the toenail. He is having his shoes adjusted today. He completed the vascular testing  Objective:  Physical Exam: warm, good capillary refill, no trophic changes or ulcerative lesions, normal sensory exam, +2 DP and PT reduced bilateral and venous stasis dermatitis noted.  Moderate to severe hallux abductovalgus with hammertoe contractures 2 through 5 bilaterally, right worse than left.  Summary:  Right: Resting right ankle-brachial index indicates noncompressible right  lower extremity arteries. The right toe-brachial index is normal. ABIs are  unreliable.   Left: Resting left ankle-brachial index is within normal range. No  evidence of significant left lower extremity arterial disease. The left  toe-brachial index is normal.      *See table(s) above for measurements and observations.        Radiographs: X-ray of the right foot: Moderate hallux abductovalgus with the presence of metatarsus adductus deformity and pes planus Assessment:   1. Ingrowing right great toenail   2. Hallux valgus with bunions, right   3. Hammertoe of right foot      Plan:  Patient was evaluated and treated and all questions answered.  Patient educated on diabetes. Discussed proper diabetic foot care and discussed risks and complications of disease. Educated patient in depth on reasons to return to the office immediately should he/she discover anything concerning or new on the feet. All questions answered. Discussed proper shoes as well.   I discussed with him in detail and reviewed his x-ray regarding the bunion he has on his right foot as well as the contracted second  toe.  I discussed with him that he is not an ideal candidate for bunion and hammertoe surgery given his diabetes albeit well controlled at 6.1% as of June 2021.  I reviewed his vascular testing and think he has appropriate arterial flow to heal. His risk would be significant edema after surgery, and bleeding risk as he is on warfarin for his cardiac issues. Additionally he would need cardiac clearance prior to surgery  Today most of his pain was consistent with pain from nail fungus and ingrowing nail. I debrided the nail today and this seemed to relieve most of the pain. We will consider total nail avulsion and matrixectomy if this helps.  Return in about 6 weeks (around 12/26/2020) for recheck ingrowing right great toenail and discuss bunion surgery.

## 2020-11-15 ENCOUNTER — Other Ambulatory Visit: Payer: Self-pay

## 2020-11-15 ENCOUNTER — Ambulatory Visit (INDEPENDENT_AMBULATORY_CARE_PROVIDER_SITE_OTHER): Payer: Medicare HMO | Admitting: General Practice

## 2020-11-15 DIAGNOSIS — Z7901 Long term (current) use of anticoagulants: Secondary | ICD-10-CM | POA: Diagnosis not present

## 2020-11-15 LAB — POCT INR: INR: 2.4 (ref 2.0–3.0)

## 2020-11-15 NOTE — Progress Notes (Unsigned)
Medical screening examination/treatment/procedure(s) were performed by non-physician practitioner and as supervising physician I was immediately available for consultation/collaboration. I agree with above. James John, MD   

## 2020-11-15 NOTE — Patient Instructions (Addendum)
Pre visit review using our clinic review tool, if applicable. No additional management support is needed unless otherwise documented below in the visit note.  Continue to take 1/2 tablet daily except take 1 tablet on Mondays and Friday. Re-check INR in 4 weeks.  Patient advised to stop taking ibuprofen.

## 2020-11-17 ENCOUNTER — Other Ambulatory Visit (HOSPITAL_COMMUNITY): Payer: Self-pay | Admitting: Cardiology

## 2020-11-17 ENCOUNTER — Other Ambulatory Visit: Payer: Self-pay | Admitting: Internal Medicine

## 2020-11-21 ENCOUNTER — Encounter: Payer: Self-pay | Admitting: Family Medicine

## 2020-11-22 ENCOUNTER — Encounter: Payer: Self-pay | Admitting: Internal Medicine

## 2020-12-01 ENCOUNTER — Other Ambulatory Visit: Payer: Self-pay

## 2020-12-01 ENCOUNTER — Encounter: Payer: Self-pay | Admitting: Physical Medicine & Rehabilitation

## 2020-12-01 ENCOUNTER — Telehealth: Payer: Self-pay

## 2020-12-01 ENCOUNTER — Encounter: Payer: Medicare HMO | Attending: Physical Medicine & Rehabilitation | Admitting: Physical Medicine & Rehabilitation

## 2020-12-01 VITALS — BP 101/70 | HR 71 | Temp 98.0°F | Ht 70.0 in | Wt 257.2 lb

## 2020-12-01 DIAGNOSIS — M75101 Unspecified rotator cuff tear or rupture of right shoulder, not specified as traumatic: Secondary | ICD-10-CM | POA: Insufficient documentation

## 2020-12-01 DIAGNOSIS — M12811 Other specific arthropathies, not elsewhere classified, right shoulder: Secondary | ICD-10-CM | POA: Insufficient documentation

## 2020-12-01 DIAGNOSIS — M791 Myalgia, unspecified site: Secondary | ICD-10-CM | POA: Diagnosis not present

## 2020-12-01 DIAGNOSIS — M12812 Other specific arthropathies, not elsewhere classified, left shoulder: Secondary | ICD-10-CM | POA: Insufficient documentation

## 2020-12-01 NOTE — Telephone Encounter (Signed)
Prior Authorization Approved for Lidocaine 5% pathes    Key: [id number]

## 2020-12-01 NOTE — Progress Notes (Signed)
Subjective:    Patient ID: [name], male    DOB: 11/12/1955, 65 y.o.   MRN: [id number]  HPI Male with pmh/psh of DM, OSA, peripheral neuropathy, PAF, CAD s/p MI, sleep disturbance, GERD, fibromyalgia, HTN, emphysema/COPD, CHF, OA, Right TKA presents with b/l shoulder pain. Started ~1990. Denies inciting event.  Denies alleviating factors. Any activity exacerbates the pain.  Varying quality.  Non-radiating. Intermittent. Tylenol,Tramadol does not help. Associated weakness. Denies numbness.  Denies falls in the last year.  Pain limits all activities.   Pain Inventory Average Pain 9 Pain Right Now 9 My pain is constant, burning, stabbing and aching  In the last 24 hours, has pain interfered with the following? General activity 6 Relation with others 3 Enjoyment of life 9 What TIME of day is your pain at its worst? morning , daytime, evening, night and varies Sleep (in general) Fair  Pain is worse with: walking, bending, standing, some activites and when I do not have my knee brace on. Pain improves with: rest and injections Relief from Meds: 0  use a cane how many minutes can you walk? 5 mins ability to climb steps?  yes do you drive?  yes Do you have any goals in this area?  yes  retired I need assistance with the following:  household duties and shopping Do you have any goals in this area?  yes  weakness trouble walking  Any changes since last visit?  no New Patient  Any changes since last visit?  no New Patient    Family History  Problem Relation Age of Onset  . COPD Mother   . Asthma Mother   . Colon polyps Mother   . Allergies Mother   . Hypothyroidism Mother   . Asthma Maternal Grandmother   . Colon cancer Neg Hx    Social History   Socioeconomic History  . Marital status: Divorced    Spouse name: Not on file  . Number of children: 2  . Years of education: Not on file  . Highest education level: Not on file  Occupational History  . Occupation:  retired/disabled. prev worked in Therapist, sports.    Employer: DISABLED  Tobacco Use  . Smoking status: Former Smoker    Packs/day: 2.00    Years: 30.00    Pack years: 60.00    Types: Cigarettes    Quit date: 10/16/2007    Years since quitting: 13.1  . Smokeless tobacco: Never Used  Vaping Use  . Vaping Use: Never used  Substance and Sexual Activity  . Alcohol use: No  . Drug use: No  . Sexual activity: Not Currently  Other Topics Concern  . Not on file  Social History Narrative   Lives alone.   Social Determinants of Health   Financial Resource Strain: Not on file  Food Insecurity: Not on file  Transportation Needs: Not on file  Physical Activity: Not on file  Stress: Not on file  Social Connections: Not on file   Past Surgical History:  Procedure Laterality Date  . ACNE CYST REMOVAL     2 on back   . AV NODE ABLATION N/A 10/25/2017   Procedure: AV NODE ABLATION;  Surgeon: Deboraha Sprang, MD;  Location: [place] [facility];  Service: Cardiovascular;  Laterality: N/A;  . BIV ICD INSERTION CRT-D N/A 09/18/2017   Procedure: BIV ICD INSERTION CRT-D;  Surgeon: Deboraha Sprang, MD;  Location: [place] [facility];  Service: Cardiovascular;  Laterality: N/A;  .  CARDIAC CATHETERIZATION N/A 03/21/2016   Procedure: Right/Left Heart Cath and Coronary Angiography;  Surgeon: Larey Dresser, MD;  Location: [place] [facility];  Service: Cardiovascular;  Laterality: N/A;  . CARDIOVERSION  04/18/2012   Procedure: CARDIOVERSION;  Surgeon: Fay Records, MD;  Location: [place];  Service: Cardiovascular;  Laterality: N/A;  . CARDIOVERSION  04/25/2012   Procedure: CARDIOVERSION;  Surgeon: Thayer Headings, MD;  Location: [place];  Service: Cardiovascular;  Laterality: N/A;  . CARDIOVERSION  04/25/2012   Procedure: CARDIOVERSION;  Surgeon: Fay Records, MD;  Location: [place];  Service: Cardiovascular;  Laterality: N/A;  . CARDIOVERSION  05/09/2012   Procedure: CARDIOVERSION;  Surgeon: Sherren Mocha, MD;  Location: [place];  Service: Cardiovascular;  Laterality: N/A;  changed from crenshaw to cooper by trish/leone-endo  . CARDIOVERSION N/A 03/15/2015   Procedure: CARDIOVERSION;  Surgeon: Thayer Headings, MD;  Location: [facility];  Service: Cardiovascular;  Laterality: N/A;  . COLONOSCOPY    . COLONOSCOPY WITH PROPOFOL N/A 10/21/2014   Procedure: COLONOSCOPY WITH PROPOFOL;  Surgeon: Ladene Artist, MD;  Location: [facility];  Service: Endoscopy;  Laterality: N/A;  . EP IMPLANTABLE DEVICE N/A 04/06/2015   Procedure: Loop Recorder Insertion;  Surgeon: Evans Lance, MD;  Location: [place] [facility];  Service: Cardiovascular;  Laterality: N/A;  . ESOPHAGOGASTRODUODENOSCOPY    . JOINT REPLACEMENT    . LOOP RECORDER REMOVAL N/A 09/18/2017   Procedure: LOOP RECORDER REMOVAL;  Surgeon: Deboraha Sprang, MD;  Location: [place] [facility];  Service: Cardiovascular;  Laterality: N/A;  . RIGHT/LEFT HEART CATH AND CORONARY ANGIOGRAPHY N/A 01/28/2017   Procedure: Right/Left Heart Cath and Coronary Angiography;  Surgeon: Larey Dresser, MD;  Location: [place] [facility];  Service: Cardiovascular;  Laterality: N/A;  . TEE WITHOUT CARDIOVERSION  04/25/2012   Procedure: TRANSESOPHAGEAL ECHOCARDIOGRAM (TEE);  Surgeon: Thayer Headings, MD;  Location: [place];  Service: Cardiovascular;  Laterality: N/A;  . TEE WITHOUT CARDIOVERSION N/A 03/15/2015   Procedure: TRANSESOPHAGEAL ECHOCARDIOGRAM (TEE);  Surgeon: Thayer Headings, MD;  Location: [place];  Service: Cardiovascular;  Laterality: N/A;  . TONSILLECTOMY AND ADENOIDECTOMY    . TOTAL KNEE ARTHROPLASTY Right 06/15/2014   Procedure: TOTAL KNEE ARTHROPLASTY;  Surgeon: Renette Butters, MD;  Location: [place];  Service: Orthopedics;  Laterality: Right;   Past Medical History:  Diagnosis Date  . AICD (automatic cardioverter/defibrillator) present   . Anemia    supposed to be taking Vit B but doesn't  . ANXIETY    takes Xanax nightly  . Arthritis    . Asthma    Albuterol prn and Advair daily;also takes Prednisone daily  . Atrial fibrillation ([place]) 09/06/2015  . Cardiomyopathy [facility])    a. EF 25% TEE July 2013; b. EF normalized 2015;  c. 03/2015 Echo: EF 40-45%, difrf HK, PASP 38 mmHg, Mild MR, sev LAE/RAE.  [name] CHF (congestive heart failure) ([place])   . Chronic constipation    takes OTC stool softener  . COPD (chronic obstructive pulmonary disease) ([place])    "one dr says COPD; one dr says emphysema" (09/18/2017)  . DEPRESSION    takes Zoloft and Doxepin daily  . Diverticulitis   . DYSKINESIA, ESOPHAGUS   . Emphysema of lung [facility])    "one dr says COPD; one dr says emphysema" (09/18/2017)  . Essential hypertension       . FIBROMYALGIA   . GERD (gastroesophageal reflux disease)       .  Glaucoma   . HYPERLIPIDEMIA    a. Intolerant to statins.  . INSOMNIA    takes Ambien nightly  . Myocardial infarction [facility])    a. 2012 Myoview notable for prior infarct;  b. 03/2015 Lexiscan CL: EF 37%, diff HK, small area of inferior infarct from apex to base-->Med Rx.  . Myocardial infarction ([place])   . O2 dependent    "2.5L q hs & prn" (09/18/2017)  . Paroxysmal atrial fibrillation (H[facility])    a. CHA2DS2VASc = 3--> takes Coumadin;  b. 03/15/2015 Successful TEE/DCCV;  c. 03/2015 recurrent afib, Amio d/c'd in setting of hyperthyroidism.  . Peripheral neuropathy   . Pneumonia 12/2016  . Rash and other nonspecific skin eruption 04/12/2009   no cause found saw dermatologists x 2 and allergist  . SLEEP APNEA, OBSTRUCTIVE    a. doesn't use CPAP  . Syncope    a. 03/2015 s/p MDT LINQ.  [name] Type II diabetes mellitus (HCC)        BP 101/70   Pulse 71   Temp 98 F (36.7 C)   Ht 5\' 10"  (1.778 m)   Wt 257 lb 3.2 oz (116.7 kg)   SpO2 97%   BMI 36.90 kg/m   Opioid Risk Score:   Fall Risk Score:  `1  Depression screen PHQ 2/9  Depression screen [facility] 2/9 12/01/2020 03/25/2020 03/23/2020 03/23/2020 03/23/2019  Decreased Interest 0 0 0 0 0  Down, Depressed, Hopeless  0 0 0 0 0  PHQ - 2 Score 0 0 0 0 0  Altered sleeping 2 - - - -  Tired, decreased energy 1 - - - -  Change in appetite 1 - - - -  Feeling bad or failure about yourself  1 - - - -  Trouble concentrating 0 - - - -  Moving slowly or fidgety/restless 0 - - - -  Suicidal thoughts 0 - - - -  PHQ-9 Score 5 - - - -  Some recent data might be hidden   Review of Systems  Constitutional: Positive for unexpected weight change (gain).  Respiratory: Positive for shortness of breath.   Cardiovascular: Positive for leg swelling.  Musculoskeletal: Positive for arthralgias, back pain, gait problem and myalgias.       Pain in both knees Pain in both feet Pain in both shoulders  Hematological: Bruises/bleeds easily.  Psychiatric/Behavioral: Positive for sleep disturbance.  All other systems reviewed and are negative.      Objective:   Physical Exam Constitutional: No distress . Vital signs reviewed. Obese.  HENT: Normocephalic.  Atraumatic. Eyes: EOMI. No discharge. Cardiovascular: No JVD.   Respiratory: Normal effort.  No stridor.   GI: Non-distended.   Skin: Warm and dry.  Intact. Psych: Normal mood.  Normal behavior. Musc: Tenderness in multiple joints.  + B/l Hawking, Neer's, Drop arm, empty can Neuro: Alert    Assessment & Plan:  Male with pmh/psh of DM, OSA, peripheral neuropathy, PAF, CAD s/p MI, sleep disturbance, GERD, fibromyalgia, HTN, emphysema/COPD, CHF, OA, Right TKA presents with b/l shoulder pain.   1. Chronic b/l shoulder pain - Impingement syndrome  Xray showing b/l degenerative changes with body overgrowth concerning for impingement syndrome  Labs reviewed  Chart/Referral information reviewed - b/l shoulder pain  PMAWARE reviewed  Only temporary benefit with Heat/Cold  No benefit with voltaren gel, Lidocaine patch  States he lost ~100lbs and felt very good when he was going to the [facility], but has been sedentary during Covid and pain has  increased  States he cannot  afford OT with trial of TENS  Will not order Cymbalta 30mg  daily with food due to polypharmacy  Will not order Robaxin 500 BID due to patient taking Soma  Tramadol per PCP  Will consider referral to Psychology  Patient states main goal is move  Steroid injections with Sports med, states he had in rotating joints every 9 weeks. Patient would like more steroid injections, but discussed that Dr. Tamala Julian likely stopping doing the injections due to limited benefit +/- frequency of injections.   Encouraged follow up with Rheum  States he will try to be more active   2. Gait abnormality  Cont cane for safety  3. Sleep disturbance  Will consider elavil to due polypharmacy  4. Morbid Obesity  Was seen by dietitian, states he was told by weight loss surgery that he is now a good candidate   5. Myalgia   Will consider trigger point injections  >45 minutes spent in total in review labs, chart, imaging and discussing with patient medications options, potential interventions and follow up plans with specialists

## 2020-12-05 ENCOUNTER — Ambulatory Visit (INDEPENDENT_AMBULATORY_CARE_PROVIDER_SITE_OTHER): Payer: Medicare HMO

## 2020-12-05 DIAGNOSIS — I5030 Unspecified diastolic (congestive) heart failure: Secondary | ICD-10-CM

## 2020-12-05 DIAGNOSIS — Z95810 Presence of automatic (implantable) cardiac defibrillator: Secondary | ICD-10-CM | POA: Diagnosis not present

## 2020-12-12 ENCOUNTER — Encounter (HOSPITAL_BASED_OUTPATIENT_CLINIC_OR_DEPARTMENT_OTHER): Payer: Medicare HMO | Admitting: Internal Medicine

## 2020-12-12 ENCOUNTER — Other Ambulatory Visit: Payer: Self-pay | Admitting: Internal Medicine

## 2020-12-12 DIAGNOSIS — L308 Other specified dermatitis: Secondary | ICD-10-CM

## 2020-12-13 ENCOUNTER — Ambulatory Visit: Payer: Medicare HMO

## 2020-12-14 NOTE — Progress Notes (Signed)
EPIC Encounter for ICM Monitoring  Patient Name: [name] is a 65 y.o. male Date: 12/14/2020 Primary Care Physican: [name], MD Primary Cardiologist:[place] Electrophysiologist:Klein Bi-V Pacing:100% 09/26/2020 Weight: 259 lbs    Transmission reviewed.    Optivol thoracic impedance trending close to baseline.  Prescribed:  Torsemide 20 mg to4tablets (80 mg total)and 3 tablets (60 mg total) every evening.  Metolazone 2.5 mg take 1 tablet as directed.   Spironolactone 25 mg take 1 tablet daily.  Labs: 11/14/2020 Creatinine 1.12, BUN 24, Potassium 4.6, Sodium 135, GFR >60 11/07/2020 Creatinine 0.92, BUN 18, Potassium 4.9, Sodium 140, GFR >60  10/20/2020 Creatinine 1.16, BUN 22, Potassium 3.8, Sodium 140, GFR >60  A complete set of results can be found in Results Review.  Recommendations: No changes.   Follow-up plan: ICM clinic phone appointment on4/01/2021. 91 day device clinic remote transmission3/21/2022  EP/Cardiology Office Visits:12/20/2020 withDr [name].  Copy of ICM check sent to [name]  3 month ICM trend: 12/05/2020.    1 Year ICM trend:       [name], RN 12/14/2020 4:35 PM

## 2020-12-15 ENCOUNTER — Ambulatory Visit: Payer: Medicare HMO

## 2020-12-20 ENCOUNTER — Ambulatory Visit (HOSPITAL_BASED_OUTPATIENT_CLINIC_OR_DEPARTMENT_OTHER)
Admission: RE | Admit: 2020-12-20 | Discharge: 2020-12-20 | Disposition: A | Discharge status: Home / Self Care | Payer: Medicare HMO | Source: Ambulatory Visit | Attending: Cardiology | Admitting: Cardiology

## 2020-12-20 ENCOUNTER — Encounter (HOSPITAL_COMMUNITY): Payer: Self-pay | Admitting: Cardiology

## 2020-12-20 ENCOUNTER — Other Ambulatory Visit: Payer: Self-pay

## 2020-12-20 ENCOUNTER — Encounter (HOSPITAL_COMMUNITY): Payer: Self-pay

## 2020-12-20 ENCOUNTER — Ambulatory Visit (HOSPITAL_COMMUNITY)
Admission: RE | Admit: 2020-12-20 | Discharge: 2020-12-20 | Disposition: A | Discharge status: Home / Self Care | Payer: Medicare HMO | Source: Ambulatory Visit | Attending: Cardiology | Admitting: Cardiology

## 2020-12-20 ENCOUNTER — Ambulatory Visit (INDEPENDENT_AMBULATORY_CARE_PROVIDER_SITE_OTHER): Payer: Medicare HMO | Admitting: General Practice

## 2020-12-20 VITALS — BP 120/78 | HR 70 | Wt 256.4 lb

## 2020-12-20 DIAGNOSIS — R42 Dizziness and giddiness: Secondary | ICD-10-CM | POA: Diagnosis not present

## 2020-12-20 DIAGNOSIS — I110 Hypertensive heart disease with heart failure: Secondary | ICD-10-CM | POA: Insufficient documentation

## 2020-12-20 DIAGNOSIS — R7989 Other specified abnormal findings of blood chemistry: Secondary | ICD-10-CM | POA: Diagnosis not present

## 2020-12-20 DIAGNOSIS — M791 Myalgia, unspecified site: Secondary | ICD-10-CM | POA: Diagnosis not present

## 2020-12-20 DIAGNOSIS — Z8679 Personal history of other diseases of the circulatory system: Secondary | ICD-10-CM | POA: Diagnosis not present

## 2020-12-20 DIAGNOSIS — E785 Hyperlipidemia, unspecified: Secondary | ICD-10-CM | POA: Insufficient documentation

## 2020-12-20 DIAGNOSIS — Z7984 Long term (current) use of oral hypoglycemic drugs: Secondary | ICD-10-CM | POA: Diagnosis not present

## 2020-12-20 DIAGNOSIS — I252 Old myocardial infarction: Secondary | ICD-10-CM | POA: Diagnosis not present

## 2020-12-20 DIAGNOSIS — R0789 Other chest pain: Secondary | ICD-10-CM | POA: Diagnosis not present

## 2020-12-20 DIAGNOSIS — I5022 Chronic systolic (congestive) heart failure: Secondary | ICD-10-CM | POA: Diagnosis not present

## 2020-12-20 DIAGNOSIS — Z79899 Other long term (current) drug therapy: Secondary | ICD-10-CM | POA: Diagnosis not present

## 2020-12-20 DIAGNOSIS — Z7901 Long term (current) use of anticoagulants: Secondary | ICD-10-CM

## 2020-12-20 DIAGNOSIS — R21 Rash and other nonspecific skin eruption: Secondary | ICD-10-CM | POA: Insufficient documentation

## 2020-12-20 DIAGNOSIS — I2510 Atherosclerotic heart disease of native coronary artery without angina pectoris: Secondary | ICD-10-CM | POA: Insufficient documentation

## 2020-12-20 DIAGNOSIS — J449 Chronic obstructive pulmonary disease, unspecified: Secondary | ICD-10-CM | POA: Diagnosis not present

## 2020-12-20 DIAGNOSIS — Z8616 Personal history of COVID-19: Secondary | ICD-10-CM | POA: Diagnosis not present

## 2020-12-20 DIAGNOSIS — I428 Other cardiomyopathies: Secondary | ICD-10-CM | POA: Insufficient documentation

## 2020-12-20 DIAGNOSIS — Z9981 Dependence on supplemental oxygen: Secondary | ICD-10-CM | POA: Insufficient documentation

## 2020-12-20 DIAGNOSIS — Z87891 Personal history of nicotine dependence: Secondary | ICD-10-CM | POA: Insufficient documentation

## 2020-12-20 DIAGNOSIS — Z95810 Presence of automatic (implantable) cardiac defibrillator: Secondary | ICD-10-CM | POA: Diagnosis not present

## 2020-12-20 DIAGNOSIS — I5030 Unspecified diastolic (congestive) heart failure: Secondary | ICD-10-CM

## 2020-12-20 DIAGNOSIS — I4821 Permanent atrial fibrillation: Secondary | ICD-10-CM | POA: Diagnosis not present

## 2020-12-20 DIAGNOSIS — G4733 Obstructive sleep apnea (adult) (pediatric): Secondary | ICD-10-CM | POA: Diagnosis not present

## 2020-12-20 DIAGNOSIS — I5032 Chronic diastolic (congestive) heart failure: Secondary | ICD-10-CM | POA: Diagnosis not present

## 2020-12-20 DIAGNOSIS — R0602 Shortness of breath: Secondary | ICD-10-CM | POA: Insufficient documentation

## 2020-12-20 LAB — BASIC METABOLIC PANEL
Anion gap: 11 (ref 5–15)
BUN: 42 mg/dL — ABNORMAL HIGH (ref 8–23)
CO2: 24 mmol/L (ref 22–32)
Calcium: 9.3 mg/dL (ref 8.9–10.3)
Chloride: 100 mmol/L (ref 98–111)
Creatinine, Ser: 1.43 mg/dL — ABNORMAL HIGH (ref 0.61–1.24)
GFR, Estimated: 54 mL/min — ABNORMAL LOW (ref >60)
Glucose, Bld: 85 mg/dL (ref 70–99)
Potassium: 4 mmol/L (ref 3.5–5.1)
Sodium: 135 mmol/L (ref 135–145)

## 2020-12-20 LAB — ECHOCARDIOGRAM COMPLETE
Area-P 1/2: 2.34 cm2
S' Lateral: 3.9 cm

## 2020-12-20 LAB — DIGOXIN LEVEL: Digoxin Level: 0.4 ng/mL — ABNORMAL LOW (ref 0.8–2.0)

## 2020-12-20 LAB — POCT INR: INR: 1.5 — AB (ref 2.0–3.0)

## 2020-12-20 MED ORDER — DAPAGLIFLOZIN PROPANEDIOL 10 MG PO TABS: 10.0000 mg | ORAL_TABLET | Freq: Every day | ORAL | 11 refills | Status: DC

## 2020-12-20 NOTE — Patient Instructions (Addendum)
Labs done today. We will contact you only if your labs are abnormal.  INCREASE Torsemide to 80mg (4 tablets) by mouth 2 times daily for 2 days THEN DECREASE to 80mg (4 tablets) by mouth every morning and 60mg (3 tablets) every evening.  START Farxiga 10mg  (1 tablet) by mouth daily.  No other medication changes were made. Please continue all current medications as prescribed.  Your physician recommends that you schedule a follow-up appointment in: 10 days for a lab only appointment and in 6 weeks for an appointment with our APP Clinic here in office.   If you have any questions or concerns before your next appointment please send [place] a message through [place] or call our office at [phone].    TO LEAVE A MESSAGE FOR THE NURSE SELECT OPTION 2, PLEASE LEAVE A MESSAGE INCLUDING: . YOUR NAME . DATE OF BIRTH . CALL BACK NUMBER . REASON FOR CALL**this is important as we prioritize the call backs  YOU WILL RECEIVE A CALL BACK THE SAME DAY AS LONG AS YOU CALL BEFORE 4:00 PM   Do the following things EVERYDAY: 1) Weigh yourself in the morning before breakfast. Write it down and keep it in a log. 2) Take your medicines as prescribed 3) Eat low salt foods--Limit salt (sodium) to 2000 mg per day.  4) Stay as active as you can everyday 5) Limit all fluids for the day to less than 2 liters   At the [facility], you and your health needs are our priority. As part of our continuing mission to provide you with exceptional heart care, we have created designated Provider Care Teams. These Care Teams include your primary Cardiologist (physician) and Advanced Practice Providers (APPs- Physician Assistants and Nurse Practitioners) who all work together to provide you with the care you need, when you need it.   You may see any of the following providers on your designated Care Team at your next follow up: [name] Dr Glori Bickers . Dr Loralie Champagne . Darrick Grinder, NP . Lyda Jester,  PA . Audry Riles, PharmD   Please be sure to bring in all your medications bottles to every appointment.

## 2020-12-20 NOTE — Progress Notes (Signed)
  Echocardiogram 2D Echocardiogram has been performed.  Tiffany G Dance 12/20/2020, 2:42 PM

## 2020-12-20 NOTE — Patient Instructions (Signed)
Pre visit review using our clinic review tool, if applicable. No additional management support is needed unless otherwise documented below in the visit note.  Take 1 tablet today and tomorrow and then continue to take 1/2 tablet daily except take 1 tablet on Mondays and Friday. Re-check INR in3 to 4 weeks.  Patient advised to stop taking ibuprofen.

## 2020-12-20 NOTE — Progress Notes (Signed)
Medical screening examination/treatment/procedure(s) were performed by non-physician practitioner and as supervising physician I was immediately available for consultation/collaboration. I agree with above. Erna Brossard, MD   

## 2020-12-21 NOTE — Progress Notes (Signed)
ID:  [name], DOB May 19, 1956, MRN [id number]  Provider location: [facility] Type of Visit: Established patient  PCP:  [name], MD  Cardiologist:  Dr. [name]   History of Present Illness: [name] is a 65 y.o. male who has history of COPD on home oxygen, permanent atrial fibrillation, and chronic systolic CHF.  Amiodarone and DCCV were tried for atrial fibrillation in the past without success.   Patient has been noted to have a low EF since 2013.  EF improved by to normal by 2015 but dropped to 30-35% by 11/16.  Cath in 6/17 showed some CAD but not enough to cause cardiomyopathy.  He was admitted in 3/18 with PNA, atrial fibrillation/RVR, and acute on chronic systolic CHF.  Echo in 3/18 showed fall in EF to 10-15%.   I took him for right and left heart cath in 4/18, which showed nonobstructive CAD and relatively optimized filling pressures.    Syncope in 11/18. Loop recorder showed 6 second pause and periods of complete heart block. He had Medtronic BiV ICD placed in 12/18.  In 1/19, he had AV nodal ablation to promote BiV pacing.   Echo was done 6/19 with EF 25-30% with moderate RV dilation/mildly decreased systolic function. CPX 7/19 was submaximal, probably mild functional impairment.    Echo in 1/21 showed EF 35-40% with diffuse hypokinesis, moderate RV dilation with mildly decreased systolic function.   He was admitted with COVID-19 PNA in 2/19.  He had AKI/hypotension and Entresto was stopped.   Echo was done today and reviewed, EF 40% with diffuse hypokinesis, mild RV enlargement, mildly decreased RV systolic function.   Patient returns for followup of CHF.   He is short of breath walking up stairs and walking around the grocery store. He never started dapagliflozin due to concern about side effects. He fatigues easily. He has developed left and right lower leg ulcerations after trauma that are healing poorly.  He has rare atypical chest pain.  He  is lightheaded if he stands too fast, no falls or syncope.  No palpitations. Weight is down 11 lbs.   Medtronic device interrogation: >99% BiV pacing, fluid index > threshold but impedance is trending back up, always in atrial fibrillation.  Labs (4/18): pro-BNP 904, K 3.1, creatinine 1.39 => 1.08 with BUN 66 => 37, hgb 14.1.  Labs (5/18): hemoglobin 13.1 Labs (6/18): K 4, creatinine 1.3, BNP 294 Labs (7/18): K 3.8, creatinine 1.23, LDL 96, HDL 42 Labs (8/18): K 4.5, creatinine 1.22 Labs (10/18): digoxin 0.4 Labs (12/18): LDL 115, HDL 37, TGs 214, K 4.2, creatinine 0.84 Labs (1/19): K 4.4, creatinine 0.75, pro-BNP 433 Labs (2/19): digoxin 0.4 Labs (3/19): LDL 121, hgb 12.8, K 4.5, creatinine 0.98 Labs (6/19): LDL 48, HDL 45, K 5, creatinine 0.94, digoxin 0.3 Labs (9/19): K 3.7, creatinine 1.09 => 1.32, digoxin 0.2 Labs (11/19): LDL 115 Labs (12/20): K 3.9, creatinine 0.87, LDL 94  Labs (3/21): K 4, creatinine 1.08 Labs (6/21): LDL 109 Labs (7/21): K 3.7, creatinine 1.15 Labs (12/21): LDL 129, TGs 239, hgb 12.9, K 3.9, creatinine 0.98, digoxin 0.8 Labs (1/22): ALT 76, AST 40, LDL 162, K 3.8 =>4.6, creatinine 1.16 => 1.12, digoxin 0.7, repeat LFTs normal  PMH: 1. Asthma 2. COPD: On home oxygen. Prior smoker.  3. Atrial fibrillation: Permanent.  He failed DCCV and amiodarone in the past . 4. HTN 5. GERD 6. Hyperlipidemia: Myalgias with atorvastatin 7. BPH.  8. Chronic systolic  CHF: Nonischemic cardiomyopathy.  - EF 25% by TEE in 7/13.  Normalized on 2015 echo. - Echo (11/16): EF 30-35%.  - LHC/RHC (6/17): 80% ostial stenosis small D1, 30-40% pLAD.  Mean RA 4, PA 26/10, mean PCWP 11, CI 2.33.  - Echo (3/18): EF 10-15%, diffuse hypokinesis, severe LV dilation, moderate RV dilation with severely decreased systolic function.  - LHC/RHC (4/18): 40% proximal LAD.  Mean RA 8, PA 37/10 mean 22, mean PCWP 22, LVEDP 9, CI 2.39. - Medtronic BiV ICD placement in 12/18 with AV nodal ablation to  promote BiV pacing in 1/19.  - Echo (6/19): EF 25-30% with mild LV dilation, moderately dilated RV with mildly decreased systolic function, IVC not dilated, PASP 23 mmHg.  - CPX (7/19): peak VO2 17.6 (78% predicted), VE/VCO2 slope 31, RER 1.02 => submaximal, probably mild functional impairment.  - Echo (1/21): EF 35-40% with diffuse hypokinesis, moderate RV dilation with mildly decreased systolic function. - Echo (3/22): EF 40% with diffuse hypokinesis, mild RV enlargement, mildly decreased RV systolic function.  9. Morbid obesity 10. ILR in place.  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 Complete heart block: s/p Medtronic CRT-D.  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 OSA: Untreated, unable to tolerate CPAP.  15. COVID-19 PNA 2/21.  16. Hyperlipidemia: Myalgias with multiple statins.  17. PAD: ABIs 10/21 noncompressible on right but TBI normal, normal ABI and TBI on left.   Social History   Socioeconomic History  . Marital status: Divorced    Spouse name: Not on file  . Number of children: 2  . Years of education: Not on file  . Highest education level: Not on file  Occupational History  . Occupation: retired/disabled. prev worked in Therapist, sports.    Employer: DISABLED  Tobacco Use  . Smoking status: Former Smoker    Packs/day: 2.00    Years: 30.00    Pack years: 60.00    Types: Cigarettes    Quit date: 10/16/2007    Years since quitting: 13.1  . Smokeless tobacco: Never Used  Vaping Use  . Vaping Use: Never used  Substance and Sexual Activity  . Alcohol use: No  . Drug use: No  . Sexual activity: Not Currently  Other Topics Concern  . Not on file  Social History Narrative   Lives alone.   Social Determinants of Health   Financial Resource Strain: Not on file  Food Insecurity: Not on file  Transportation Needs: Not on file  Physical Activity: Not on file  Stress: Not on file  Social Connections: Not on file  Intimate Partner Violence: Not on file   Family History   Problem Relation Age of Onset  . COPD Mother   . Asthma Mother   . Colon polyps Mother   . Allergies Mother   . Hypothyroidism Mother   . Asthma Maternal Grandmother   . Colon cancer Neg Hx    ROS: All systems reviewed and negative except as per HPI.   Current Outpatient Medications  Medication Sig Dispense Refill  . acetaminophen (TYLENOL) 500 MG tablet Take 500 mg by mouth every 6 (six) hours as needed.    [name] albuterol (VENTOLIN HFA) 108 (90 Base) MCG/ACT inhaler INHALE 2 PUFFS 4 TIMES A DAY as needed 54 g 3  . ALPRAZolam (XANAX) 0.5 MG tablet TAKE 2 TABLETS BY MOUTH AT BEDTIME 60 tablet 2  . augmented betamethasone dipropionate (DIPROLENE-AF) 0.05 % cream Apply topically.    . carisoprodol (SOMA) 350 MG tablet TAKE 1 TABLET BY  MOUTH THREE TIMES A DAY AS NEEDED FOR MUSCLE SPASMS 90 tablet 5  . carvedilol (COREG) 12.5 MG tablet Take 1 tablet (12.5 mg total) by mouth 2 (two) times daily with a meal. 180 tablet 3  . dapagliflozin propanediol (FARXIGA) 10 MG TABS tablet Take 1 tablet (10 mg total) by mouth daily before breakfast. 30 tablet 11  . digoxin (LANOXIN) 0.25 MG tablet TAKE 1/2 TABLET BY MOUTH EVERY DAY 45 tablet 3  . doxepin (SINEQUAN) 10 MG capsule Take 2 capsules (20 mg total) by mouth at bedtime as needed. 180 capsule 1  . Dupilumab (DUPIXENT) 300 MG/2ML SOPN Inject 1 Dose into the skin every 14 (fourteen) days.    . Evolocumab (REPATHA SURECLICK) 350 MG/ML SOAJ Inject 1 pen into the skin every 14 (fourteen) days. 6 mL 3  . fluticasone (FLONASE) 50 MCG/ACT nasal spray PLACE 2 SPRAYS INTO BOTH NOSTRILS DAILY AS NEEDED FOR ALLERGIES OR RHINITIS. 48 mL 1  . hydrOXYzine (ATARAX/VISTARIL) 10 MG tablet TAKE 1 TABLET (10 MG TOTAL) BY MOUTH 3 (THREE) TIMES DAILY AS NEEDED FOR ITCHING. 60 tablet 2  . lidocaine (LIDODERM) 5 % APPLY 1 PATCH TO AFFECTED AREA FOR UP TO 12 HOURS . DO NOT USE MORE THAN ONE PATCH IN 24 HOURS. 90 patch 2  . metolazone (ZAROXOLYN) 2.5 MG tablet Take 1 tablet  (2.5 mg total) by mouth as directed. 5 tablet 0  . omeprazole (PRILOSEC) 20 MG capsule TAKE 1 CAPSULE BY MOUTH TWICE A DAY 180 capsule 1  . potassium chloride SA (KLOR-CON) 20 MEQ tablet Take 1 tablet (20 mEq total) by mouth daily. 90 tablet 3  . sacubitril-valsartan (ENTRESTO) 24-26 MG Take 1 tablet by mouth 2 (two) times daily. 180 tablet 3  . spironolactone (ALDACTONE) 25 MG tablet TAKE 1 TABLET BY MOUTH EVERY DAY 90 tablet 3  . tamsulosin (FLOMAX) 0.4 MG CAPS capsule     . torsemide (DEMADEX) 20 MG tablet Take 4 tablets (80 mg total) by mouth in the morning AND 3 tablets (60 mg total) every evening. 240 tablet 3  . traMADol (ULTRAM) 50 MG tablet TAKE 1 TABLET (50 MG TOTAL) BY MOUTH DAILY AS NEEDED. 30 tablet 5  . traZODone (DESYREL) 100 MG tablet TAKE 1 TABLET BY MOUTH EVERY DAY AT BEDTIME AS NEEDED 90 tablet 1  . TUMS 500 MG chewable tablet Chew 500-2,000 mg by mouth every 4 (four) hours as needed for indigestion or heartburn.     . valACYclovir (VALTREX) 1000 MG tablet TAKE 1 TABLET BY MOUTH THREE TIMES A DAY 21 tablet 2  . venlafaxine XR (EFFEXOR-XR) 75 MG 24 hr capsule TAKE 1 CAPSULE BY MOUTH DAILY WITH BREAKFAST. 90 capsule 1  . zolpidem (AMBIEN) 10 MG tablet 1 by mouth at bedtime as needed 90 tablet 1  . warfarin (COUMADIN) 10 MG tablet Take 10 mg by mouth daily. Mon,wed,fri,sat     No current facility-administered medications for this encounter.   BP 120/78   Pulse 70   Wt 116.3 kg (256 lb 6.4 oz)   SpO2 96%   BMI 36.79 kg/m  General: NAD Neck: No JVD, no thyromegaly or thyroid nodule.  Lungs: Clear to auscultation bilaterally with normal respiratory effort. CV: Nondisplaced PMI.  Heart regular S1/S2, no S3/S4, no murmur.  1+ edema to knees.  No carotid bruit.  Normal pedal pulses.  Abdomen: Soft, nontender, no hepatosplenomegaly, no distention.  Skin: Intact without lesions or rashes.  Neurologic: Alert and oriented x 3.  Psych:  Normal affect. Extremities: No clubbing or  cyanosis.  HEENT: Normal.   Assessment/Plan:  1. Atrial fibrillation: Permanent.  Now s/p AV nodal ablation to allow effective BiV pacing.  - Continue warfarin (cannot afford DOACs).  2. COPD: No longer smoking.  Not using oxygen.  3. Chronic systolic CHF: Nonischemic cardiomyopathy based on 6/17 cath. However, he had a recent significant fall in EF from 30-35% to 10-15% on echo from 3/18.  LHC/RHC in 4/18 showed nonobstructive CAD and relatively optimized filling pressures.  He now has Medtronic CRT-D device with AV nodal ablation to allow BiV pacing.  Echo 6/19 with EF 25-30% with mildly decreased RV systolic function. CPX 7/19 was submaximal but likely only mild functional limitation from CHF.  Echo in 1/21 showed EF 35-40%, echo in 3/22 showed EF up to 40% with mildly decreased RV systolic function.  NYHA class III symptoms.  Weight is down.  Thoracic impedance starting to trend up on device interrogation.  He is probably mildly volume overloaded.  - Continue Coreg 12.5 mg bid.   - Continue spironolactone 25 mg daily.        - Continue Entresto 24/26 bid.   - Rather than increase torsemide, I want him to start Farxiga 10 mg daily.  We talked about the medication and he is willing to try it. BMEt today and in 10 days.  - Continue torsemide 80 qam/60 qpm.   - Continue digoxin 0.125 daily, check level.  4. CAD: Nonobstructive on 4/18 cath.  No exertional chest pain.  - He is on warfarin so no ASA.  - Statin myalgias, continue Repatha.  Check lipids today.    5. Complete heart block: Now has Medtronic CRT-D device and s/p AV nodal ablation.  6. Rash: Spongiotic dermatitis.  Improved.  7. OSA: Unable to tolerate CPAP.   8. Elevated LFTs: Most recent LFTs back to normal.   9. LLE wound/ulceration: Very slow to heal. ABIs not from 10/21 not remarkable.  - Will send to wound clinic due to slow healing.   Followup 6 wks with APP.   Signed, Loralie Champagne, MD  12/21/2020  [place] [address] [facility] [place] [address] [phone] (office) [phone] (fax)

## 2020-12-29 ENCOUNTER — Ambulatory Visit: Payer: Medicare HMO | Admitting: Podiatry

## 2020-12-30 ENCOUNTER — Ambulatory Visit: Payer: Medicare HMO | Admitting: Family Medicine

## 2020-12-30 NOTE — Progress Notes (Deleted)
[name] Phone: [phone] Subjective:    I'm seeing this patient by the request  of:  [name], MD  CC:   [id number]   10/24/2020 Patient has significant amount of different comorbidities that does make patient's chronic pain challenging.  Patient is now on digoxin as well and I feel we are capped on any type of medications that we can do and patient will be referred to pain management to discuss further treatment options.  Patient is in agreement with the plan.  Patient is very appreciative of all the care that he has received and understands that this is difficult.  Patient follow-up with me again in 10 to 12 weeks if we do continue the injections.  Patient does have significant  Patient having worsening symptoms and instability.  Walking with the aid of a cane.  Has not done bracing previously.  Patient has failed all conservative therapy and does have severe osteoarthritic changes of the knee.  Encouraged him to talk to orthopedic surgery to discuss the possibility of replacement.  Patient does not want to do this and does not know if they will do it secondary to his other comorbidities.  We discussed if this continues to give him trouble and are unable to make him a surgical candidate we would consider doing the injections every 10 to 12 weeks patient is in agreement with the plan and will follow up at that time  Update 12/30/2020 [name] is a 65 y.o. male coming in with complaint of B shoulder pain and L knee pain. Patient states       Past Medical History:  Diagnosis Date  . AICD (automatic cardioverter/defibrillator) present   . Anemia    supposed to be taking Vit B but doesn't  . ANXIETY    takes Xanax nightly  . Arthritis   . Asthma    Albuterol prn and Advair daily;also takes Prednisone daily  . Atrial fibrillation ([place]) 09/06/2015  . Cardiomyopathy [facility])    a. EF 25% TEE July 2013;  b. EF normalized 2015;  c. 03/2015 Echo: EF 40-45%, difrf HK, PASP 38 mmHg, Mild MR, sev LAE/RAE.  [name] CHF (congestive heart failure) ([place])   . Chronic constipation    takes OTC stool softener  . COPD (chronic obstructive pulmonary disease) ([place])    "one dr says COPD; one dr says emphysema" (09/18/2017)  . DEPRESSION    takes Zoloft and Doxepin daily  . Diverticulitis   . DYSKINESIA, ESOPHAGUS   . Emphysema of lung [facility])    "one dr says COPD; one dr says emphysema" (09/18/2017)  . Essential hypertension       . FIBROMYALGIA   . GERD (gastroesophageal reflux disease)       . Glaucoma   . HYPERLIPIDEMIA    a. Intolerant to statins.  . INSOMNIA    takes Ambien nightly  . Myocardial infarction [facility])    a. 2012 Myoview notable for prior infarct;  b. 03/2015 Lexiscan CL: EF 37%, diff HK, small area of inferior infarct from apex to base-->Med Rx.  . Myocardial infarction ([place])   . O2 dependent    "2.5L q hs & prn" (09/18/2017)  . Paroxysmal atrial fibrillation ([facility])    a. CHA2DS2VASc = 3--> takes Coumadin;  b. 03/15/2015 Successful TEE/DCCV;  c. 03/2015 recurrent afib, Amio d/c'd in setting of hyperthyroidism.  . Peripheral neuropathy   . Pneumonia 12/2016  .  Rash and other nonspecific skin eruption 04/12/2009   no cause found saw dermatologists x 2 and allergist  . SLEEP APNEA, OBSTRUCTIVE    a. doesn't use CPAP  . Syncope    a. 03/2015 s/p MDT LINQ.  [name] Type II diabetes mellitus ([place])        Past Surgical History:  Procedure Laterality Date  . ACNE CYST REMOVAL     2 on back   . AV NODE ABLATION N/A 10/25/2017   Procedure: AV NODE ABLATION;  Surgeon: Deboraha Sprang, MD;  Location: [place] CV LAB;  Service: Cardiovascular;  Laterality: N/A;  . BIV ICD INSERTION CRT-D N/A 09/18/2017   Procedure: BIV ICD INSERTION CRT-D;  Surgeon: Deboraha Sprang, MD;  Location: [place] CV LAB;  Service: Cardiovascular;  Laterality: N/A;  . CARDIAC CATHETERIZATION N/A 03/21/2016   Procedure:  Right/Left Heart Cath and Coronary Angiography;  Surgeon: Larey Dresser, MD;  Location: [place] CV LAB;  Service: Cardiovascular;  Laterality: N/A;  . CARDIOVERSION  04/18/2012   Procedure: CARDIOVERSION;  Surgeon: Fay Records, MD;  Location: [place];  Service: Cardiovascular;  Laterality: N/A;  . CARDIOVERSION  04/25/2012   Procedure: CARDIOVERSION;  Surgeon: Thayer Headings, MD;  Location: [place];  Service: Cardiovascular;  Laterality: N/A;  . CARDIOVERSION  04/25/2012   Procedure: CARDIOVERSION;  Surgeon: Fay Records, MD;  Location: [place];  Service: Cardiovascular;  Laterality: N/A;  . CARDIOVERSION  05/09/2012   Procedure: CARDIOVERSION;  Surgeon: Sherren Mocha, MD;  Location: [place];  Service: Cardiovascular;  Laterality: N/A;  changed from crenshaw to cooper by trish/leone-endo  . CARDIOVERSION N/A 03/15/2015   Procedure: CARDIOVERSION;  Surgeon: Thayer Headings, MD;  Location: [facility] ENDOSCOPY;  Service: Cardiovascular;  Laterality: N/A;  . COLONOSCOPY    . COLONOSCOPY WITH PROPOFOL N/A 10/21/2014   Procedure: COLONOSCOPY WITH PROPOFOL;  Surgeon: Ladene Artist, MD;  Location: [facility] ENDOSCOPY;  Service: Endoscopy;  Laterality: N/A;  . EP IMPLANTABLE DEVICE N/A 04/06/2015   Procedure: Loop Recorder Insertion;  Surgeon: Evans Lance, MD;  Location: [place] CV LAB;  Service: Cardiovascular;  Laterality: N/A;  . ESOPHAGOGASTRODUODENOSCOPY    . JOINT REPLACEMENT    . LOOP RECORDER REMOVAL N/A 09/18/2017   Procedure: LOOP RECORDER REMOVAL;  Surgeon: Deboraha Sprang, MD;  Location: [place] CV LAB;  Service: Cardiovascular;  Laterality: N/A;  . RIGHT/LEFT HEART CATH AND CORONARY ANGIOGRAPHY N/A 01/28/2017   Procedure: Right/Left Heart Cath and Coronary Angiography;  Surgeon: Larey Dresser, MD;  Location: [place] CV LAB;  Service: Cardiovascular;  Laterality: N/A;  . TEE WITHOUT CARDIOVERSION  04/25/2012   Procedure: TRANSESOPHAGEAL ECHOCARDIOGRAM (TEE);  Surgeon: Thayer Headings, MD;   Location: [place];  Service: Cardiovascular;  Laterality: N/A;  . TEE WITHOUT CARDIOVERSION N/A 03/15/2015   Procedure: TRANSESOPHAGEAL ECHOCARDIOGRAM (TEE);  Surgeon: Thayer Headings, MD;  Location: [place];  Service: Cardiovascular;  Laterality: N/A;  . TONSILLECTOMY AND ADENOIDECTOMY    . TOTAL KNEE ARTHROPLASTY Right 06/15/2014   Procedure: TOTAL KNEE ARTHROPLASTY;  Surgeon: Renette Butters, MD;  Location: [place];  Service: Orthopedics;  Laterality: Right;   Social History   Socioeconomic History  . Marital status: Divorced    Spouse name: Not on file  . Number of children: 2  . Years of education: Not on file  . Highest education level: Not on file  Occupational History  . Occupation: retired/disabled. prev worked in Therapist, sports.  Employer: DISABLED  Tobacco Use  . Smoking status: Former Smoker    Packs/day: 2.00    Years: 30.00    Pack years: 60.00    Types: Cigarettes    Quit date: 10/16/2007    Years since quitting: 13.2  . Smokeless tobacco: Never Used  Vaping Use  . Vaping Use: Never used  Substance and Sexual Activity  . Alcohol use: No  . Drug use: No  . Sexual activity: Not Currently  Other Topics Concern  . Not on file  Social History Narrative   Lives alone.   Social Determinants of Health   Financial Resource Strain: Not on file  Food Insecurity: Not on file  Transportation Needs: Not on file  Physical Activity: Not on file  Stress: Not on file  Social Connections: Not on file   Allergies  Allergen Reactions  . Amiodarone Other (See Comments)    hyperthyroidism  . Statins Other (See Comments)    myalgia  . Tape Other (See Comments)    Skin Tears Use Paper Tape Only   Family History  Problem Relation Age of Onset  . COPD Mother   . Asthma Mother   . Colon polyps Mother   . Allergies Mother   . Hypothyroidism Mother   . Asthma Maternal Grandmother   . Colon cancer Neg Hx     Current Outpatient Medications (Endocrine &  Metabolic):  .  dapagliflozin propanediol (FARXIGA) 10 MG TABS tablet, Take 1 tablet (10 mg total) by mouth daily before breakfast.  Current Outpatient Medications (Cardiovascular):  .  carvedilol (COREG) 12.5 MG tablet, Take 1 tablet (12.5 mg total) by mouth 2 (two) times daily with a meal. .  digoxin (LANOXIN) 0.25 MG tablet, TAKE 1/2 TABLET BY MOUTH EVERY DAY .  Evolocumab (REPATHA SURECLICK) 409 MG/ML SOAJ, Inject 1 pen into the skin every 14 (fourteen) days. .  metolazone (ZAROXOLYN) 2.5 MG tablet, Take 1 tablet (2.5 mg total) by mouth as directed. .  sacubitril-valsartan (ENTRESTO) 24-26 MG, Take 1 tablet by mouth 2 (two) times daily. [name]  spironolactone (ALDACTONE) 25 MG tablet, TAKE 1 TABLET BY MOUTH EVERY DAY .  torsemide (DEMADEX) 20 MG tablet, Take 4 tablets (80 mg total) by mouth in the morning AND 3 tablets (60 mg total) every evening.  Current Outpatient Medications (Respiratory):  .  albuterol (VENTOLIN HFA) 108 (90 Base) MCG/ACT inhaler, INHALE 2 PUFFS 4 TIMES A DAY as needed .  fluticasone (FLONASE) 50 MCG/ACT nasal spray, PLACE 2 SPRAYS INTO BOTH NOSTRILS DAILY AS NEEDED FOR ALLERGIES OR RHINITIS.  Current Outpatient Medications (Analgesics):  .  acetaminophen (TYLENOL) 500 MG tablet, Take 500 mg by mouth every 6 (six) hours as needed. .  traMADol (ULTRAM) 50 MG tablet, TAKE 1 TABLET (50 MG TOTAL) BY MOUTH DAILY AS NEEDED.  Current Outpatient Medications (Hematological):  .  warfarin (COUMADIN) 10 MG tablet, Take 10 mg by mouth daily. Mon,wed,fri,sat  Current Outpatient Medications (Other):  [name]  ALPRAZolam (XANAX) 0.5 MG tablet, TAKE 2 TABLETS BY MOUTH AT BEDTIME .  augmented betamethasone dipropionate (DIPROLENE-AF) 0.05 % cream, Apply topically. .  carisoprodol (SOMA) 350 MG tablet, TAKE 1 TABLET BY MOUTH THREE TIMES A DAY AS NEEDED FOR MUSCLE SPASMS .  doxepin (SINEQUAN) 10 MG capsule, Take 2 capsules (20 mg total) by mouth at bedtime as needed. .  Dupilumab (DUPIXENT)  300 MG/2ML SOPN, Inject 1 Dose into the skin every 14 (fourteen) days. .  hydrOXYzine (ATARAX/VISTARIL) 10 MG tablet, TAKE 1  TABLET (10 MG TOTAL) BY MOUTH 3 (THREE) TIMES DAILY AS NEEDED FOR ITCHING. .  lidocaine (LIDODERM) 5 %, APPLY 1 PATCH TO AFFECTED AREA FOR UP TO 12 HOURS . DO NOT USE MORE THAN ONE PATCH IN 24 HOURS. [name]  omeprazole (PRILOSEC) 20 MG capsule, TAKE 1 CAPSULE BY MOUTH TWICE A DAY .  potassium chloride SA (KLOR-CON) 20 MEQ tablet, Take 1 tablet (20 mEq total) by mouth daily. .  tamsulosin (FLOMAX) 0.4 MG CAPS capsule,  .  traZODone (DESYREL) 100 MG tablet, TAKE 1 TABLET BY MOUTH EVERY DAY AT BEDTIME AS NEEDED .  TUMS 500 MG chewable tablet, Chew 500-2,000 mg by mouth every 4 (four) hours as needed for indigestion or heartburn.  .  valACYclovir (VALTREX) 1000 MG tablet, TAKE 1 TABLET BY MOUTH THREE TIMES A DAY .  venlafaxine XR (EFFEXOR-XR) 75 MG 24 hr capsule, TAKE 1 CAPSULE BY MOUTH DAILY WITH BREAKFAST. [name]  zolpidem (AMBIEN) 10 MG tablet, 1 by mouth at bedtime as needed   Reviewed prior external information including notes and imaging from  primary care provider As well as notes that were available from care everywhere and other healthcare systems.  Past medical history, social, surgical and family history all reviewed in electronic medical record.  No pertanent information unless stated regarding to the chief complaint.   Review of Systems:  No headache, visual changes, nausea, vomiting, diarrhea, constipation, dizziness, abdominal pain, skin rash, fevers, chills, night sweats, weight loss, swollen lymph nodes, body aches, joint swelling, chest pain, shortness of breath, mood changes. POSITIVE muscle aches  Objective  There were no vitals taken for this visit.   General: No apparent distress alert and oriented x3 mood and affect normal, dressed appropriately.  HEENT: Pupils equal, extraocular movements intact  Respiratory: Patient's speak in full sentences and does not  appear short of breath  Cardiovascular: No lower extremity edema, non tender, no erythema  Gait normal with good balance and coordination.  MSK:  Non tender with full range of motion and good stability and symmetric strength and tone of shoulders, elbows, wrist, hip, knee and ankles bilaterally.     Impression and Recommendations:     The above documentation has been reviewed and is accurate and complete Jacqualin Combes

## 2021-01-02 ENCOUNTER — Encounter: Payer: Self-pay | Admitting: Internal Medicine

## 2021-01-03 ENCOUNTER — Other Ambulatory Visit (HOSPITAL_COMMUNITY): Payer: Medicare HMO

## 2021-01-09 ENCOUNTER — Other Ambulatory Visit (HOSPITAL_COMMUNITY): Payer: Self-pay | Admitting: *Deleted

## 2021-01-09 ENCOUNTER — Telehealth (HOSPITAL_COMMUNITY): Payer: Self-pay | Admitting: *Deleted

## 2021-01-09 DIAGNOSIS — S81812D Laceration without foreign body, left lower leg, subsequent encounter: Secondary | ICD-10-CM

## 2021-01-09 NOTE — Telephone Encounter (Signed)
Called and spoke with pt he cancelled the initial appt because he thought wound was healed but it is not. Pt would like new referral to wound care center.

## 2021-01-09 NOTE — Telephone Encounter (Signed)
-----   Message from Jorge Ny,  sent at 01/09/2021  9:25 AM EDT ----- Michela Pitcher we made a referral to the wound center for him and was wondering what was going on with that

## 2021-01-10 ENCOUNTER — Other Ambulatory Visit: Payer: Self-pay

## 2021-01-10 ENCOUNTER — Ambulatory Visit: Payer: Medicare HMO | Admitting: Family Medicine

## 2021-01-10 ENCOUNTER — Ambulatory Visit: Payer: Medicare HMO | Admitting: Podiatry

## 2021-01-10 DIAGNOSIS — M21611 Bunion of right foot: Secondary | ICD-10-CM | POA: Diagnosis not present

## 2021-01-10 DIAGNOSIS — B351 Tinea unguium: Secondary | ICD-10-CM | POA: Diagnosis not present

## 2021-01-10 DIAGNOSIS — E11622 Type 2 diabetes mellitus with other skin ulcer: Secondary | ICD-10-CM

## 2021-01-10 DIAGNOSIS — L97923 Non-pressure chronic ulcer of unspecified part of left lower leg with necrosis of muscle: Secondary | ICD-10-CM

## 2021-01-10 DIAGNOSIS — M79675 Pain in left toe(s): Secondary | ICD-10-CM

## 2021-01-10 DIAGNOSIS — M2041 Other hammer toe(s) (acquired), right foot: Secondary | ICD-10-CM

## 2021-01-10 DIAGNOSIS — M79674 Pain in right toe(s): Secondary | ICD-10-CM | POA: Diagnosis not present

## 2021-01-10 DIAGNOSIS — M2011 Hallux valgus (acquired), right foot: Secondary | ICD-10-CM

## 2021-01-10 DIAGNOSIS — E119 Type 2 diabetes mellitus without complications: Secondary | ICD-10-CM

## 2021-01-10 DIAGNOSIS — I739 Peripheral vascular disease, unspecified: Secondary | ICD-10-CM

## 2021-01-11 ENCOUNTER — Encounter: Payer: Self-pay | Admitting: Podiatry

## 2021-01-11 NOTE — Progress Notes (Signed)
  Subjective:  Patient ID: [name], male    DOB: 1956-01-18,  MRN: [id number]  Chief Complaint  Patient presents with  . Nail Problem    Nail trim     65 y.o. male returns with the above complaint. History confirmed with patient.  Doing well no new issues, he is still interested in bunion surgery  Objective:  Physical Exam: warm, good capillary refill, no trophic changes or ulcerative lesions, normal sensory exam, +2 DP and PT reduced bilateral and venous stasis dermatitis noted.  Moderate to severe hallux abductovalgus with hammertoe contractures 2 through 5 bilaterally, right worse than left.  Summary:  Right: Resting right ankle-brachial index indicates noncompressible right  lower extremity arteries. The right toe-brachial index is normal. ABIs are  unreliable.   Left: Resting left ankle-brachial index is within normal range. No  evidence of significant left lower extremity arterial disease. The left  toe-brachial index is normal.      *See table(s) above for measurements and observations.        Radiographs: X-ray of the right foot: Moderate hallux abductovalgus with the presence of metatarsus adductus deformity and pes planus Assessment:   No diagnosis found.   Plan:  Patient was evaluated and treated and all questions answered.  Patient educated on diabetes. Discussed proper diabetic foot care and discussed risks and complications of disease. Educated patient in depth on reasons to return to the office immediately should he/she discover anything concerning or new on the feet. All questions answered. Discussed proper shoes as well.  Discussed the etiology and treatment options for the condition in detail with the patient. Educated patient on the topical and oral treatment options for mycotic nails. Recommended debridement of the nails today. Sharp and mechanical debridement performed of all painful and mycotic nails today. Nails debrided in length and  thickness using a nail nipper to level of comfort. Discussed treatment options including appropriate shoe gear. Follow up as needed for painful nails.  We can discuss bunion surgery and I think that cylindrical off on for now.  He has had an issue with delayed wound healing on his left leg.  Concerns that he would have complications of wound healing and infection and bone healing problems after surgery.  He will require vascular surgery evaluation prior to any elective surgery.  Return in about 10 weeks (around 03/21/2021) for check ingrown nails, right foot bunion .

## 2021-01-13 ENCOUNTER — Other Ambulatory Visit: Payer: Self-pay | Admitting: Internal Medicine

## 2021-01-13 DIAGNOSIS — I428 Other cardiomyopathies: Secondary | ICD-10-CM

## 2021-01-13 DIAGNOSIS — I255 Ischemic cardiomyopathy: Secondary | ICD-10-CM

## 2021-01-13 DIAGNOSIS — I4819 Other persistent atrial fibrillation: Secondary | ICD-10-CM

## 2021-01-16 NOTE — Progress Notes (Signed)
[facility] [address] [place] Phone: [phone] Subjective:   I [name] am serving as a Education administrator for Dr. Hulan Saas.  This visit occurred during the SARS-CoV-2 public health emergency.  Safety protocols were in place, including screening questions prior to the visit, additional usage of staff PPE, and extensive cleaning of exam room while observing appropriate contact time as indicated for disinfecting solutions.   I'm seeing this patient by the request  of:  [name], MD  CC: Bilateral shoulder pain, knee pain.  CBJ:[id number]   10/24/2020 Patient has significant amount of different comorbidities that does make patient's chronic pain challenging.  Patient is now on digoxin as well and I feel we are capped on any type of medications that we can do and patient will be referred to pain management to discuss further treatment options.  Patient is in agreement with the plan.  Patient is very appreciative of all the care that he has received and understands that this is difficult.  Patient follow-up with me again in 10 to 12 weeks if we do continue the injections.  Patient does have significant  Patient having worsening symptoms and instability.  Walking with the aid of a cane.  Has not done bracing previously.  Patient has failed all conservative therapy and does have severe osteoarthritic changes of the knee.  Encouraged him to talk to orthopedic surgery to discuss the possibility of replacement.  Patient does not want to do this and does not know if they will do it secondary to his other comorbidities.  We discussed if this continues to give him trouble and are unable to make him a surgical candidate we would consider doing the injections every 10 to 12 weeks patient is in agreement with the plan and will follow up at that time  Update 01/17/2021 [name] is a 65 y.o. male coming in with complaint of L knee and B shoulder pain.  Patient states his left knee is painful today. States he would like an injection. [place] a few weeks ago and is currently using 2 canes to walk.  Shoulders are not good. States that walking with a walker hurts his shoulders too bad.  Known rotator cuff arthropathy.  Patient is having worsening pain again.  Affecting daily activities.  Patient unable to use the walker secondary to the shoulder pain but feels like the knee is significantly unstable.     Past Medical History:  Diagnosis Date  . AICD (automatic cardioverter/defibrillator) present   . Anemia    supposed to be taking Vit B but doesn't  . ANXIETY    takes Xanax nightly  . Arthritis   . Asthma    Albuterol prn and Advair daily;also takes Prednisone daily  . Atrial fibrillation ([place]) 09/06/2015  . Cardiomyopathy [facility])    a. EF 25% TEE July 2013; b. EF normalized 2015;  c. 03/2015 Echo: EF 40-45%, difrf HK, PASP 38 mmHg, Mild MR, sev LAE/RAE.  [name] CHF (congestive heart failure) ([place])   . Chronic constipation    takes OTC stool softener  . COPD (chronic obstructive pulmonary disease) ([place])    "one dr says COPD; one dr says emphysema" (09/18/2017)  . DEPRESSION    takes Zoloft and Doxepin daily  . Diverticulitis   . DYSKINESIA, ESOPHAGUS   . Emphysema of lung [facility])    "one dr says COPD; one dr says emphysema" (09/18/2017)  . Essential hypertension       .  FIBROMYALGIA   . GERD (gastroesophageal reflux disease)       . Glaucoma   . HYPERLIPIDEMIA    a. Intolerant to statins.  . INSOMNIA    takes Ambien nightly  . Myocardial infarction [facility])    a. 2012 Myoview notable for prior infarct;  b. 03/2015 Lexiscan CL: EF 37%, diff HK, small area of inferior infarct from apex to base-->Med Rx.  . Myocardial infarction ([place])   . O2 dependent    "2.5L q hs & prn" (09/18/2017)  . Paroxysmal atrial fibrillation ([facility])    a. CHA2DS2VASc = 3--> takes Coumadin;  b. 03/15/2015 Successful TEE/DCCV;  c. 03/2015 recurrent afib, Amio d/c'd in  setting of hyperthyroidism.  . Peripheral neuropathy   . Pneumonia 12/2016  . Rash and other nonspecific skin eruption 04/12/2009   no cause found saw dermatologists x 2 and allergist  . SLEEP APNEA, OBSTRUCTIVE    a. doesn't use CPAP  . Syncope    a. 03/2015 s/p MDT LINQ.  [name] Type II diabetes mellitus ([place])        Past Surgical History:  Procedure Laterality Date  . ACNE CYST REMOVAL     2 on back   . AV NODE ABLATION N/A 10/25/2017   Procedure: AV NODE ABLATION;  Surgeon: Deboraha Sprang, MD;  Location: [place] CV LAB;  Service: Cardiovascular;  Laterality: N/A;  . BIV ICD INSERTION CRT-D N/A 09/18/2017   Procedure: BIV ICD INSERTION CRT-D;  Surgeon: Deboraha Sprang, MD;  Location: [place] CV LAB;  Service: Cardiovascular;  Laterality: N/A;  . CARDIAC CATHETERIZATION N/A 03/21/2016   Procedure: Right/Left Heart Cath and Coronary Angiography;  Surgeon: Larey Dresser, MD;  Location: [place] CV LAB;  Service: Cardiovascular;  Laterality: N/A;  . CARDIOVERSION  04/18/2012   Procedure: CARDIOVERSION;  Surgeon: Fay Records, MD;  Location: [place];  Service: Cardiovascular;  Laterality: N/A;  . CARDIOVERSION  04/25/2012   Procedure: CARDIOVERSION;  Surgeon: Thayer Headings, MD;  Location: [place];  Service: Cardiovascular;  Laterality: N/A;  . CARDIOVERSION  04/25/2012   Procedure: CARDIOVERSION;  Surgeon: Fay Records, MD;  Location: [place];  Service: Cardiovascular;  Laterality: N/A;  . CARDIOVERSION  05/09/2012   Procedure: CARDIOVERSION;  Surgeon: Sherren Mocha, MD;  Location: [place];  Service: Cardiovascular;  Laterality: N/A;  changed from crenshaw to cooper by trish/leone-endo  . CARDIOVERSION N/A 03/15/2015   Procedure: CARDIOVERSION;  Surgeon: Thayer Headings, MD;  Location: [facility] ENDOSCOPY;  Service: Cardiovascular;  Laterality: N/A;  . COLONOSCOPY    . COLONOSCOPY WITH PROPOFOL N/A 10/21/2014   Procedure: COLONOSCOPY WITH PROPOFOL;  Surgeon: Ladene Artist, MD;  Location: [facility]  ENDOSCOPY;  Service: Endoscopy;  Laterality: N/A;  . EP IMPLANTABLE DEVICE N/A 04/06/2015   Procedure: Loop Recorder Insertion;  Surgeon: [place] Lance, MD;  Location: [place] CV LAB;  Service: Cardiovascular;  Laterality: N/A;  . ESOPHAGOGASTRODUODENOSCOPY    . JOINT REPLACEMENT    . LOOP RECORDER REMOVAL N/A 09/18/2017   Procedure: LOOP RECORDER REMOVAL;  Surgeon: Deboraha Sprang, MD;  Location: [place] CV LAB;  Service: Cardiovascular;  Laterality: N/A;  . RIGHT/LEFT HEART CATH AND CORONARY ANGIOGRAPHY N/A 01/28/2017   Procedure: Right/Left Heart Cath and Coronary Angiography;  Surgeon: Larey Dresser, MD;  Location: [place] CV LAB;  Service: Cardiovascular;  Laterality: N/A;  . TEE WITHOUT CARDIOVERSION  04/25/2012   Procedure: TRANSESOPHAGEAL ECHOCARDIOGRAM (TEE);  Surgeon: Thayer Headings, MD;  Location: [facility] ENDOSCOPY;  Service: Cardiovascular;  Laterality: N/A;  . TEE WITHOUT CARDIOVERSION N/A 03/15/2015   Procedure: TRANSESOPHAGEAL ECHOCARDIOGRAM (TEE);  Surgeon: Thayer Headings, MD;  Location: [place];  Service: Cardiovascular;  Laterality: N/A;  . TONSILLECTOMY AND ADENOIDECTOMY    . TOTAL KNEE ARTHROPLASTY Right 06/15/2014   Procedure: TOTAL KNEE ARTHROPLASTY;  Surgeon: Renette Butters, MD;  Location: [place];  Service: Orthopedics;  Laterality: Right;   Social History   Socioeconomic History  . Marital status: Divorced    Spouse name: Not on file  . Number of children: 2  . Years of education: Not on file  . Highest education level: Not on file  Occupational History  . Occupation: retired/disabled. prev worked in Therapist, sports.    Employer: DISABLED  Tobacco Use  . Smoking status: Former Smoker    Packs/day: 2.00    Years: 30.00    Pack years: 60.00    Types: Cigarettes    Quit date: 10/16/2007    Years since quitting: 13.2  . Smokeless tobacco: Never Used  Vaping Use  . Vaping Use: Never used  Substance and Sexual Activity  . Alcohol use: No  . Drug  use: No  . Sexual activity: Not Currently  Other Topics Concern  . Not on file  Social History Narrative   Lives alone.   Social Determinants of Health   Financial Resource Strain: Not on file  Food Insecurity: Not on file  Transportation Needs: Not on file  Physical Activity: Not on file  Stress: Not on file  Social Connections: Not on file   Allergies  Allergen Reactions  . Amiodarone Other (See Comments)    hyperthyroidism  . Statins Other (See Comments)    myalgia  . Tape Other (See Comments)    Skin Tears Use Paper Tape Only   Family History  Problem Relation Age of Onset  . COPD Mother   . Asthma Mother   . Colon polyps Mother   . Allergies Mother   . Hypothyroidism Mother   . Asthma Maternal Grandmother   . Colon cancer Neg Hx     Current Outpatient Medications (Endocrine & Metabolic):  .  dapagliflozin propanediol (FARXIGA) 10 MG TABS tablet, Take 1 tablet (10 mg total) by mouth daily before breakfast.  Current Outpatient Medications (Cardiovascular):  .  carvedilol (COREG) 12.5 MG tablet, Take 1 tablet (12.5 mg total) by mouth 2 (two) times daily with a meal. .  digoxin (LANOXIN) 0.25 MG tablet, TAKE 1/2 TABLET BY MOUTH EVERY DAY .  Evolocumab (REPATHA SURECLICK) 355 MG/ML SOAJ, Inject 1 pen into the skin every 14 (fourteen) days. .  metolazone (ZAROXOLYN) 2.5 MG tablet, Take 1 tablet (2.5 mg total) by mouth as directed. .  sacubitril-valsartan (ENTRESTO) 24-26 MG, Take 1 tablet by mouth 2 (two) times daily. [name]  spironolactone (ALDACTONE) 25 MG tablet, TAKE 1 TABLET BY MOUTH EVERY DAY .  torsemide (DEMADEX) 20 MG tablet, Take 4 tablets (80 mg total) by mouth in the morning AND 3 tablets (60 mg total) every evening.  Current Outpatient Medications (Respiratory):  .  albuterol (VENTOLIN HFA) 108 (90 Base) MCG/ACT inhaler, INHALE 2 PUFFS 4 TIMES A DAY as needed .  fluticasone (FLONASE) 50 MCG/ACT nasal spray, PLACE 2 SPRAYS INTO BOTH NOSTRILS DAILY AS NEEDED FOR  ALLERGIES OR RHINITIS.  Current Outpatient Medications (Analgesics):  .  acetaminophen (TYLENOL) 500 MG tablet, Take 500 mg by mouth every 6 (six) hours as needed. .  traMADol (  ULTRAM) 50 MG tablet, TAKE 1 TABLET (50 MG TOTAL) BY MOUTH DAILY AS NEEDED.  Current Outpatient Medications (Hematological):  .  warfarin (COUMADIN) 10 MG tablet, Take 10 mg by mouth daily. Mon,wed,fri,sat  Current Outpatient Medications (Other):  [name]  ALPRAZolam (XANAX) 0.5 MG tablet, TAKE 2 TABLETS BY MOUTH AT BEDTIME .  AMBULATORY NON FORMULARY MEDICATION, Rolling walker with a bench .  augmented betamethasone dipropionate (DIPROLENE-AF) 0.05 % cream, Apply topically. .  carisoprodol (SOMA) 350 MG tablet, TAKE 1 TABLET BY MOUTH THREE TIMES A DAY AS NEEDED FOR MUSCLE SPASMS .  doxepin (SINEQUAN) 10 MG capsule, Take 2 capsules (20 mg total) by mouth at bedtime as needed. .  Dupilumab (DUPIXENT) 300 MG/2ML SOPN, Inject 1 Dose into the skin every 14 (fourteen) days. .  hydrOXYzine (ATARAX/VISTARIL) 10 MG tablet, TAKE 1 TABLET (10 MG TOTAL) BY MOUTH 3 (THREE) TIMES DAILY AS NEEDED FOR ITCHING. .  lidocaine (LIDODERM) 5 %, APPLY 1 PATCH TO AFFECTED AREA FOR UP TO 12 HOURS . DO NOT USE MORE THAN ONE PATCH IN 24 HOURS. [name]  omeprazole (PRILOSEC) 20 MG capsule, TAKE 1 CAPSULE BY MOUTH TWICE A DAY .  potassium chloride SA (KLOR-CON) 20 MEQ tablet, Take 1 tablet (20 mEq total) by mouth daily. .  tamsulosin (FLOMAX) 0.4 MG CAPS capsule,  .  traZODone (DESYREL) 100 MG tablet, TAKE 1 TABLET BY MOUTH EVERY DAY AT BEDTIME AS NEEDED .  TUMS 500 MG chewable tablet, Chew 500-2,000 mg by mouth every 4 (four) hours as needed for indigestion or heartburn.  .  valACYclovir (VALTREX) 1000 MG tablet, TAKE 1 TABLET BY MOUTH THREE TIMES A DAY .  venlafaxine XR (EFFEXOR-XR) 75 MG 24 hr capsule, TAKE 1 CAPSULE BY MOUTH DAILY WITH BREAKFAST. [name]  zolpidem (AMBIEN) 10 MG tablet, 1 by mouth at bedtime as needed   Reviewed prior external information  including notes and imaging from  primary care provider As well as notes that were available from care everywhere and other healthcare systems.  Past medical history, social, surgical and family history all reviewed in electronic medical record.  No pertanent information unless stated regarding to the chief complaint.   Review of Systems:  No headache, visual changes, nausea, vomiting, diarrhea, constipation, dizziness, abdominal pain, skin rash, fevers, chills, night sweats, weight loss, swollen lymph nodes,  chest pain, shortness of breath, mood changes. POSITIVE muscle aches, body aches, joint swelling  Objective  Blood pressure 130/88, pulse 77, height 5\' 10"  (1.778 m), weight 263 lb (119.3 kg), SpO2 96 %.   General: No apparent distress alert and oriented x3 mood and affect normal, dressed appropriately.  HEENT: Pupils equal, extraocular movements intact  Respiratory: Patient's speak in full sentences and does not appear short of breath  Cardiovascular: Trace lower extremity edema, non tender, no erythema  Gait severely antalgic Significant arthritic changes of multiple joints. Left knee exam does have varus deformity noted.  significant instability with valgus and varus force.  Abnormality of the thigh to calf ratio.  Patient does have crepitus with range of motion.  Bilateral shoulder exam shows the patient does have atrophy noted.  Significant crepitus with range of motion.  Patient does have weakness noted on the left greater than the right.  After informed written and verbal consent, patient was seated on exam table. Left knee was prepped with alcohol swab and utilizing anterolateral approach, patient's left knee space was injected with 4:1  marcaine 0.5%: Kenalog 40mg /dL. Patient tolerated the procedure well without  immediate complications.  Procedure: Real-time Ultrasound Guided Injection of right glenohumeral joint Device: GE Logiq Q7  Ultrasound guided injection is preferred  based studies that show increased duration, increased effect, greater accuracy, decreased procedural pain, increased response rate with ultrasound guided versus blind injection.  Verbal informed consent obtained.  Time-out conducted.  Noted no overlying erythema, induration, or other signs of local infection.  Skin prepped in a sterile fashion.  Local anesthesia: Topical Ethyl chloride.  With sterile technique and under real time ultrasound guidance:  Joint visualized.  23g 1  inch needle inserted posterior approach. Pictures taken for needle placement. Patient did have injection of 2 cc of 0.5% Marcaine, and 1.0 cc of Kenalog 40 mg/dL. Completed without difficulty  Pain immediately resolved suggesting accurate placement of the medication.  Advised to call if fevers/chills, erythema, induration, drainage, or persistent bleeding.  Impression: Technically successful ultrasound guided injection.  Procedure: Real-time Ultrasound Guided Injection of left glenohumeral joint Device: GE Logiq E  Ultrasound guided injection is preferred based studies that show increased duration, increased effect, greater accuracy, decreased procedural pain, increased response rate with ultrasound guided versus blind injection.  Verbal informed consent obtained.  Time-out conducted.  Noted no overlying erythema, induration, or other signs of local infection.  Skin prepped in a sterile fashion.  Local anesthesia: Topical Ethyl chloride.  With sterile technique and under real time ultrasound guidance:  Joint visualized.  21g 2 inch needle inserted posterior approach. Pictures taken for needle placement. Patient did have injection of 2 cc of 0.5% Marcaine, and 1cc of Kenalog 40 mg/dL. Completed without difficulty  Pain immediately resolved suggesting accurate placement of the medication.  Advised to call if fevers/chills, erythema, induration, drainage, or persistent bleeding.  Impression: Technically successful  ultrasound guided injection.   Impression and Recommendations:     The above documentation has been reviewed and is accurate and complete Lyndal Pulley, DO

## 2021-01-17 ENCOUNTER — Ambulatory Visit: Payer: Medicare HMO | Admitting: Family Medicine

## 2021-01-17 ENCOUNTER — Other Ambulatory Visit: Payer: Self-pay

## 2021-01-17 ENCOUNTER — Ambulatory Visit: Payer: Self-pay

## 2021-01-17 ENCOUNTER — Encounter: Payer: Self-pay | Admitting: Family Medicine

## 2021-01-17 ENCOUNTER — Ambulatory Visit (INDEPENDENT_AMBULATORY_CARE_PROVIDER_SITE_OTHER): Payer: Medicare HMO | Admitting: General Practice

## 2021-01-17 VITALS — BP 130/88 | HR 77 | Ht 70.0 in | Wt 263.0 lb

## 2021-01-17 DIAGNOSIS — M12811 Other specific arthropathies, not elsewhere classified, right shoulder: Secondary | ICD-10-CM | POA: Diagnosis not present

## 2021-01-17 DIAGNOSIS — M1712 Unilateral primary osteoarthritis, left knee: Secondary | ICD-10-CM | POA: Diagnosis not present

## 2021-01-17 DIAGNOSIS — M25511 Pain in right shoulder: Secondary | ICD-10-CM | POA: Diagnosis not present

## 2021-01-17 DIAGNOSIS — M25512 Pain in left shoulder: Secondary | ICD-10-CM | POA: Diagnosis not present

## 2021-01-17 DIAGNOSIS — G8929 Other chronic pain: Secondary | ICD-10-CM | POA: Diagnosis not present

## 2021-01-17 DIAGNOSIS — M12812 Other specific arthropathies, not elsewhere classified, left shoulder: Secondary | ICD-10-CM

## 2021-01-17 DIAGNOSIS — Z7901 Long term (current) use of anticoagulants: Secondary | ICD-10-CM

## 2021-01-17 LAB — POCT INR: INR: 2.5 (ref 2.0–3.0)

## 2021-01-17 MED ORDER — AMBULATORY NON FORMULARY MEDICATION: 0 refills | Status: DC

## 2021-01-17 NOTE — Progress Notes (Signed)
Medical screening examination/treatment/procedure(s) were performed by non-physician practitioner and as supervising physician I was immediately available for consultation/collaboration. I agree with above. Oprah Camarena, MD   

## 2021-01-17 NOTE — Assessment & Plan Note (Addendum)
Bilateral injections given again today.  We discussed the possibility of referral to orthopedic surgeon which patient declined.  Discussed with patient to keep active.  Patient will be given a rolling walker with bench to see if they will be more beneficial.  Patient does have instability of the left knee noted.  Patient will follow up with me again in 3 months  Very difficult to treat patient secondary to patient's other comorbidities.  Patient is also on a blood thinner which makes anti-inflammatories impossible.  Injections seem to help him more than anything else.  Has seen pain management.

## 2021-01-17 NOTE — Patient Instructions (Signed)
Pre visit review using our clinic review tool, if applicable. No additional management support is needed unless otherwise documented below in the visit note.  Continue to take 1/2 tablet daily except take 1 tablet on Mondays and Friday. Re-check INR in 6 weeks.  Patient advised to stop taking ibuprofen.

## 2021-01-17 NOTE — Patient Instructions (Addendum)
Good to see you Rolling walker with bench If you change your mind on surgery I can get you in See me again in 3 months

## 2021-01-17 NOTE — Assessment & Plan Note (Signed)
Repeat injection given again today.  Severe arthritic changes of the knee with instability.  Patient does not want a knee replacement with how much difficulty patient is having with the contralateral side.  Patient encouraged to potentially lose weight.  Wear his brace on a more regular basis but finds it cumbersome.  Follow-up with me again in 3 months

## 2021-01-20 NOTE — Progress Notes (Signed)
No ICM remote transmission received for 01/16/2021 and next ICM transmission scheduled for 02/06/2021.   

## 2021-01-23 ENCOUNTER — Other Ambulatory Visit: Payer: Self-pay

## 2021-01-23 ENCOUNTER — Ambulatory Visit (HOSPITAL_COMMUNITY)
Admission: RE | Admit: 2021-01-23 | Discharge: 2021-01-23 | Disposition: A | Discharge status: Home / Self Care | Payer: Medicare HMO | Source: Ambulatory Visit | Attending: Internal Medicine | Admitting: Internal Medicine

## 2021-01-23 DIAGNOSIS — I5032 Chronic diastolic (congestive) heart failure: Secondary | ICD-10-CM | POA: Diagnosis not present

## 2021-01-23 DIAGNOSIS — Z006 Encounter for examination for normal comparison and control in clinical research program: Secondary | ICD-10-CM

## 2021-01-23 LAB — BASIC METABOLIC PANEL
Anion gap: 7 (ref 5–15)
BUN: 26 mg/dL — ABNORMAL HIGH (ref 8–23)
CO2: 24 mmol/L (ref 22–32)
Calcium: 9 mg/dL (ref 8.9–10.3)
Chloride: 107 mmol/L (ref 98–111)
Creatinine, Ser: 1.14 mg/dL (ref 0.61–1.24)
GFR, Estimated: >60 mL/min (ref >60)
Glucose, Bld: 95 mg/dL (ref 70–99)
Potassium: 4.4 mmol/L (ref 3.5–5.1)
Sodium: 138 mmol/L (ref 135–145)

## 2021-01-23 NOTE — Research (Signed)
ANTHEM Informed Consent   Subject Name: [name]  Subject met inclusion and exclusion criteria.  The informed consent form, study requirements and expectations were reviewed with the subject and questions and concerns were addressed prior to the signing of the consent form.  The subject verbalized understanding of the trial requirements.  The subject agreed to participate in the ANTHEM trial and signed the informed consent at 1356 on 11/APR/2022.  The informed consent was obtained prior to performance of any protocol-specific procedures for the subject.  A copy of the signed informed consent was given to the subject and a copy was placed in the subject's medical record.   [name]

## 2021-01-23 NOTE — Research (Signed)
Cardiopulmonary Exam  Was the CP Exam Performed? [x]  Yes (Complete Below) []  No (Complete Protocol Deviation)   Was the assessment collected on the same date as the Visit Date?  Date CP Exam Performed (dd/MMM/yyyy):  [x]  Yes []  No      Heart Sounds  S3: [x]  Absent []  Present   S4: [x]  Absent []  Present  Murmur  Murmur: []  Yes [x]  No   If Yes:  []  Systolic []  Diastolic   If yes select grade: []  I/VI []  II/VI []  III/VI []  IV/VI []  V/VI []  VI/VI   Chest Sounds  Rales: []  Yes [x]  No   If yes check one: []  ? 1/3 of lung fields full []  1/2 of lung fields full []  > 1/2 of lung fields full  If yes check one: []  Left []  Right []  Bilateral    Edema   Edema (pedal): []  Absent []  Trace [x]  1+ (slight) []  2+ (moderate) []  3+ (severe)    Hepatomegaly  Hepatomegaly: [x]  None []  < 2 cm []  2-4 cm []  > 4 cm  Peripheral Pulses  Peripheral Pulses: [x]  Normal []  Abnormal   If Abnormal, explain:        Physical Exam    Physical Examination      Was the physical exam performed?    Was the assessment collected on the same date as the Visit Date?    If No - Date of Echo (dd/MMM/yyyy):    General Appearance     If abnormal, clinically significant?   Describe Clinically Relevant abnormalities:    Ophthalmologic     If abnormal, clinically significant?   Describe Clinically Relevant abnormalities:     HEENT     If abnormal, clinically significant?   Describe Clinically Relevant abnormalities:     Neurologic     If abnormal, clinically significant?  Describe Clinically Relevant abnormalities:     Gastrointestinal     If abnormal, clinically significant?   Describe Clinically Relevant abnormalities:    Dermatologic     If abnormal, clinically significant?  Describe Clinically Relevant abnormalities:     Musculoskeletal    If  abnormal, clinically significant?    Describe Clinically Relevant abnormalities:         Other [x]  Yes []  No (complete a protocol deviation)   []  Yes []  No       [x]  Normal []  Abnormal []  Not Done  []  Yes []  No       [x]  Normal []  Abnormal []  Not Done  []  Yes []  No        [x]  Normal []  Abnormal []  Not Done  []  Yes []  No         [x] Normal  [] Abnormal  [] Not Done   [] Yes  [] No         [x] Normal  [] Abnormal  [] Not Done      [] Yes  [] No         [x] Normal  [] Abnormal  [] Not Done    [] Yes  [] No          [x] Normal  [] Abnormal  [] Not Done    [] Yes  [] No          [x] Normal  [] Abnormal  [] Not Done   [] Yes  [] No           Other, specify:      Result:     If abnormal, clinically significant?    Describe Clinically Relevant abnormalities     [x]  Normal []   Abnormal []  Not Done  []  Yes []  No

## 2021-01-24 ENCOUNTER — Encounter (HOSPITAL_COMMUNITY): Payer: Self-pay

## 2021-01-25 ENCOUNTER — Encounter (HOSPITAL_COMMUNITY): Payer: Self-pay | Admitting: Adult Health

## 2021-01-25 ENCOUNTER — Ambulatory Visit (INDEPENDENT_AMBULATORY_CARE_PROVIDER_SITE_OTHER): Payer: Medicare HMO

## 2021-01-25 DIAGNOSIS — I5022 Chronic systolic (congestive) heart failure: Secondary | ICD-10-CM

## 2021-01-25 DIAGNOSIS — I428 Other cardiomyopathies: Secondary | ICD-10-CM

## 2021-01-25 NOTE — Progress Notes (Signed)
SCR [id number]                                                                                                                                                    Randomization #   [place] [employer]  (NYHA)  These individuals should be listed on the site delegation of duties (DOD) log with these tasks delegated to them.  They should not play any other role in the study to keep them blinded to treatment assignment (Therapy Arm versus Control Arm).   [place] [employer] (NYHA) Heart Failure Classification   Was NYHA Performed? [x]  Yes (Complete Below) []  No (Complete Protocol Deviation)   Was the assessment collected on the same date as the Visit Date? [x]  Yes []  No   If no - Date of NYHA (dd/MMM/yyyy): 01/23/21  Was the NYHA administered In Person (at the site) or At Home (remotely)? [x]  In Person []  At [place] the subject's NYHA Classification at the time of the assessment:  []  Class I: No limitation of physical activity. Ordinary physical activity does not cause undue fatigue, palpitation or dyspnea (shortness of breath).  []  Class II: Slight limitation of physical activity. Comfortable at rest, but ordinary physical activity results in fatigue, palpitation or dyspnea.  [x]  Class III: Marked limitation of physical activity. Comfortable at rest, but less than ordinary activity causes fatigue, palpitation or dyspnea.  []  Class IV: Unable to carry out any physical activity without discomfort. Symptoms of cardiac insufficiency at rest. If any physical activity is undertaken, discomfort is increased.   Visit interval  [x]  Enrollment  []  Week 4 []  Week 10/ Final Titration []  3 month  []  6 month []  9 month []  12 month []  16 month  []  32 month []  48 month    Milano Rosevear NP-C  3:04 PM

## 2021-01-26 ENCOUNTER — Encounter (HOSPITAL_BASED_OUTPATIENT_CLINIC_OR_DEPARTMENT_OTHER): Payer: Medicare HMO | Admitting: Internal Medicine

## 2021-01-27 ENCOUNTER — Telehealth: Payer: Self-pay

## 2021-01-27 LAB — CUP PACEART REMOTE DEVICE CHECK
Battery Remaining Longevity: 39 mo
Battery Voltage: 2.9 V
Brady Statistic AP VP Percent: 0 %
Brady Statistic AP VS Percent: 0 %
Brady Statistic AS VP Percent: 0 %
Brady Statistic AS VS Percent: 0 %
Brady Statistic RA Percent Paced: 0 %
Brady Statistic RV Percent Paced: 99.8 %
Date Time Interrogation Session: 20220412203527
HighPow Impedance: 73 Ohm
Implantable Lead Implant Date: 20181205
Implantable Lead Implant Date: 20181205
Implantable Lead Location: 753858
Implantable Lead Location: 753860
Implantable Lead Model: 4398
Implantable Pulse Generator Implant Date: 20181205
Lead Channel Impedance Value: 1007 Ohm
Lead Channel Impedance Value: 1007 Ohm
Lead Channel Impedance Value: 1140 Ohm
Lead Channel Impedance Value: 1178 Ohm
Lead Channel Impedance Value: 230.327
Lead Channel Impedance Value: 264.733
Lead Channel Impedance Value: 264.733
Lead Channel Impedance Value: 299.908
Lead Channel Impedance Value: 299.908
Lead Channel Impedance Value: 399 Ohm
Lead Channel Impedance Value: 399 Ohm
Lead Channel Impedance Value: 4047 Ohm
Lead Channel Impedance Value: 418 Ohm
Lead Channel Impedance Value: 513 Ohm
Lead Channel Impedance Value: 722 Ohm
Lead Channel Impedance Value: 722 Ohm
Lead Channel Impedance Value: 817 Ohm
Lead Channel Impedance Value: 836 Ohm
Lead Channel Pacing Threshold Amplitude: 0.375 V
Lead Channel Pacing Threshold Pulse Width: 0.4 ms
Lead Channel Sensing Intrinsic Amplitude: 15.5 mV
Lead Channel Sensing Intrinsic Amplitude: 15.5 mV
Lead Channel Setting Pacing Amplitude: 2.25 V
Lead Channel Setting Pacing Amplitude: 2.5 V
Lead Channel Setting Pacing Pulse Width: 0.4 ms
Lead Channel Setting Pacing Pulse Width: 0.6 ms
Lead Channel Setting Sensing Sensitivity: 0.3 mV

## 2021-01-27 NOTE — Telephone Encounter (Signed)
Attempted ICM Call to patient.  Left message requesting to sent remote transmission today to check fluid levels.  Left detailed message per DPR regarding 01/24/2021 remote transmission suggesting fluid accumulation.  ICM number left for return call.

## 2021-01-27 NOTE — Telephone Encounter (Signed)
Patient returned call.  He stated he is not able to send remote transmission today due to he only has 1 plug in the room he rents and cannot keep the monitor plugged in all the time.  The landlord does not allow him to have a drop cord so that he can plug in his phone charger and remote monitor.  Advised if the monitor is unplugged then the clinic will not receive any alerts regarding how his device if functioning or if he is have problems with heart rhythm.  He understands but cannot do anything differently at this time.  He has an appointment with Dr [name] on Tuesday next week, 4/19.  Advised if he can plug in the monitor on 4/25 and send a remote transmission at that time.  He agreed.

## 2021-01-30 ENCOUNTER — Other Ambulatory Visit (HOSPITAL_COMMUNITY): Payer: Self-pay | Admitting: Cardiology

## 2021-01-30 ENCOUNTER — Other Ambulatory Visit: Payer: Self-pay | Admitting: Internal Medicine

## 2021-01-30 NOTE — Telephone Encounter (Signed)
Please refill as per office routine med refill policy (all routine meds refilled for 3 mo or monthly per pt preference up to one year from last visit, then month to month grace period for 3 mo, then further med refills will have to be denied)  

## 2021-01-31 ENCOUNTER — Other Ambulatory Visit: Payer: Self-pay

## 2021-01-31 ENCOUNTER — Encounter (HOSPITAL_COMMUNITY): Payer: Self-pay

## 2021-01-31 ENCOUNTER — Ambulatory Visit (HOSPITAL_COMMUNITY)
Admission: RE | Admit: 2021-01-31 | Discharge: 2021-01-31 | Disposition: A | Discharge status: Home / Self Care | Payer: Medicare HMO | Source: Ambulatory Visit | Attending: Adult Health | Admitting: Adult Health

## 2021-01-31 ENCOUNTER — Encounter: Payer: Self-pay | Admitting: Internal Medicine

## 2021-01-31 ENCOUNTER — Encounter: Payer: Self-pay | Admitting: *Deleted

## 2021-01-31 VITALS — BP 122/88 | HR 79 | Wt 252.8 lb

## 2021-01-31 DIAGNOSIS — Z7901 Long term (current) use of anticoagulants: Secondary | ICD-10-CM | POA: Insufficient documentation

## 2021-01-31 DIAGNOSIS — Z9981 Dependence on supplemental oxygen: Secondary | ICD-10-CM | POA: Diagnosis not present

## 2021-01-31 DIAGNOSIS — I110 Hypertensive heart disease with heart failure: Secondary | ICD-10-CM | POA: Diagnosis not present

## 2021-01-31 DIAGNOSIS — I2510 Atherosclerotic heart disease of native coronary artery without angina pectoris: Secondary | ICD-10-CM | POA: Insufficient documentation

## 2021-01-31 DIAGNOSIS — Z8616 Personal history of COVID-19: Secondary | ICD-10-CM | POA: Insufficient documentation

## 2021-01-31 DIAGNOSIS — I428 Other cardiomyopathies: Secondary | ICD-10-CM | POA: Insufficient documentation

## 2021-01-31 DIAGNOSIS — I4819 Other persistent atrial fibrillation: Secondary | ICD-10-CM | POA: Diagnosis not present

## 2021-01-31 DIAGNOSIS — R7989 Other specified abnormal findings of blood chemistry: Secondary | ICD-10-CM | POA: Diagnosis not present

## 2021-01-31 DIAGNOSIS — M25569 Pain in unspecified knee: Secondary | ICD-10-CM | POA: Diagnosis not present

## 2021-01-31 DIAGNOSIS — R0602 Shortness of breath: Secondary | ICD-10-CM | POA: Diagnosis not present

## 2021-01-31 DIAGNOSIS — J449 Chronic obstructive pulmonary disease, unspecified: Secondary | ICD-10-CM | POA: Diagnosis not present

## 2021-01-31 DIAGNOSIS — I442 Atrioventricular block, complete: Secondary | ICD-10-CM | POA: Diagnosis not present

## 2021-01-31 DIAGNOSIS — G4733 Obstructive sleep apnea (adult) (pediatric): Secondary | ICD-10-CM | POA: Diagnosis not present

## 2021-01-31 DIAGNOSIS — G4739 Other sleep apnea: Secondary | ICD-10-CM

## 2021-01-31 DIAGNOSIS — Z79899 Other long term (current) drug therapy: Secondary | ICD-10-CM | POA: Insufficient documentation

## 2021-01-31 DIAGNOSIS — Z7984 Long term (current) use of oral hypoglycemic drugs: Secondary | ICD-10-CM | POA: Diagnosis not present

## 2021-01-31 DIAGNOSIS — Z87891 Personal history of nicotine dependence: Secondary | ICD-10-CM | POA: Diagnosis not present

## 2021-01-31 DIAGNOSIS — I4821 Permanent atrial fibrillation: Secondary | ICD-10-CM | POA: Diagnosis not present

## 2021-01-31 DIAGNOSIS — I5030 Unspecified diastolic (congestive) heart failure: Secondary | ICD-10-CM | POA: Diagnosis not present

## 2021-01-31 DIAGNOSIS — Z006 Encounter for examination for normal comparison and control in clinical research program: Secondary | ICD-10-CM

## 2021-01-31 DIAGNOSIS — M791 Myalgia, unspecified site: Secondary | ICD-10-CM | POA: Diagnosis not present

## 2021-01-31 DIAGNOSIS — R21 Rash and other nonspecific skin eruption: Secondary | ICD-10-CM | POA: Insufficient documentation

## 2021-01-31 DIAGNOSIS — Z95810 Presence of automatic (implantable) cardiac defibrillator: Secondary | ICD-10-CM | POA: Diagnosis not present

## 2021-01-31 DIAGNOSIS — E785 Hyperlipidemia, unspecified: Secondary | ICD-10-CM | POA: Insufficient documentation

## 2021-01-31 LAB — BASIC METABOLIC PANEL
Anion gap: 8 (ref 5–15)
BUN: 24 mg/dL — ABNORMAL HIGH (ref 8–23)
CO2: 25 mmol/L (ref 22–32)
Calcium: 9.3 mg/dL (ref 8.9–10.3)
Chloride: 103 mmol/L (ref 98–111)
Creatinine, Ser: 1.27 mg/dL — ABNORMAL HIGH (ref 0.61–1.24)
GFR, Estimated: >60 mL/min (ref >60)
Glucose, Bld: 111 mg/dL — ABNORMAL HIGH (ref 70–99)
Potassium: 4.1 mmol/L (ref 3.5–5.1)
Sodium: 136 mmol/L (ref 135–145)

## 2021-01-31 MED ORDER — ENTRESTO 49-51 MG PO TABS: 1.0000 | ORAL_TABLET | Freq: Two times a day (BID) | ORAL | 3 refills | Status: DC

## 2021-01-31 NOTE — Research (Signed)
Subject came back to lab today for lab re-draw to check NT pro BNP level for [facility].

## 2021-01-31 NOTE — Progress Notes (Signed)
ID:  [name], DOB 20-Aug-1956, MRN [id number]  Provider location: [place] [facility] Type of Visit: Established patient  PCP:  [name], MD  Cardiologist:  Dr. [name]   History of Present Illness: [name] is a 65 y.o. male who has history of COPD on home oxygen, permanent atrial fibrillation, and chronic systolic CHF.  Amiodarone and DCCV were tried for atrial fibrillation in the past without success.   Patient has been noted to have a low EF since 2013.  EF improved by to normal by 2015 but dropped to 30-35% by 11/16.  Cath in 6/17 showed some CAD but not enough to cause cardiomyopathy.  He was admitted in 3/18 with PNA, atrial fibrillation/RVR, and acute on chronic systolic CHF.  Echo in 3/18 showed fall in EF to 10-15%.   He had right and left heart cath in 4/18, which showed nonobstructive CAD and relatively optimized filling pressures.    Syncope in 11/18. Loop recorder showed 6 second pause and periods of complete heart block. He had Medtronic BiV ICD placed in 12/18.  In 1/19, he had AV nodal ablation to promote BiV pacing.   Echo was done 6/19 with EF 25-30% with moderate RV dilation/mildly decreased systolic function. CPX 7/19 was submaximal, probably mild functional impairment.    Echo in 1/21 showed EF 35-40% with diffuse hypokinesis, moderate RV dilation with mildly decreased systolic function.   He was admitted with COVID-19 PNA in 2/19.  He had AKI/hypotension and Entresto was stopped.   Echo 12/2020, EF 40% with diffuse hypokinesis, mild RV enlargement, mildly decreased RV systolic function.   Today he returns for HF follow up.Overall feeling fine. Limited by his knee pain. SOB with inclines. Denies PND/Orthopnea. Appetite ok. No fever or chills. Weight at home 250-252 pounds. Taking all medications.    Medtronic device interrogation: >99% BiV pacing, fluid index back down.   Labs (4/18): pro-BNP 904, K 3.1, creatinine 1.39 => 1.08 with BUN  66 => 37, hgb 14.1.  Labs (5/18): hemoglobin 13.1 Labs (6/18): K 4, creatinine 1.3, BNP 294 Labs (7/18): K 3.8, creatinine 1.23, LDL 96, HDL 42 Labs (8/18): K 4.5, creatinine 1.22 Labs (10/18): digoxin 0.4 Labs (12/18): LDL 115, HDL 37, TGs 214, K 4.2, creatinine 0.84 Labs (1/19): K 4.4, creatinine 0.75, pro-BNP 433 Labs (2/19): digoxin 0.4 Labs (3/19): LDL 121, hgb 12.8, K 4.5, creatinine 0.98 Labs (6/19): LDL 48, HDL 45, K 5, creatinine 0.94, digoxin 0.3 Labs (9/19): K 3.7, creatinine 1.09 => 1.32, digoxin 0.2 Labs (11/19): LDL 115 Labs (12/20): K 3.9, creatinine 0.87, LDL 94  Labs (3/21): K 4, creatinine 1.08 Labs (6/21): LDL 109 Labs (7/21): K 3.7, creatinine 1.15 Labs (12/21): LDL 129, TGs 239, hgb 12.9, K 3.9, creatinine 0.98, digoxin 0.8 Labs (1/22): ALT 76, AST 40, LDL 162, K 3.8 =>4.6, creatinine 1.16 => 1.12, digoxin 0.7, repeat LFTs normal  PMH: 1. Asthma 2. COPD: On home oxygen. Prior smoker.  3. Atrial fibrillation: Permanent.  He failed DCCV and amiodarone in the past . 4. HTN 5. GERD 6. Hyperlipidemia: Myalgias with atorvastatin 7. BPH.  8. Chronic systolic CHF: Nonischemic cardiomyopathy.  - EF 25% by TEE in 7/13.  Normalized on 2015 echo. - Echo (11/16): EF 30-35%.  - LHC/RHC (6/17): 80% ostial stenosis small D1, 30-40% pLAD.  Mean RA 4, PA 26/10, mean PCWP 11, CI 2.33.  - Echo (3/18): EF 10-15%, diffuse hypokinesis, severe LV dilation, moderate RV dilation with severely  decreased systolic function.  - LHC/RHC (4/18): 40% proximal LAD.  Mean RA 8, PA 37/10 mean 22, mean PCWP 22, LVEDP 9, CI 2.39. - Medtronic BiV ICD placement in 12/18 with AV nodal ablation to promote BiV pacing in 1/19.  - Echo (6/19): EF 25-30% with mild LV dilation, moderately dilated RV with mildly decreased systolic function, IVC not dilated, PASP 23 mmHg.  - CPX (7/19): peak VO2 17.6 (78% predicted), VE/VCO2 slope 31, RER 1.02 => submaximal, probably mild functional impairment.  - Echo  (1/21): EF 35-40% with diffuse hypokinesis, moderate RV dilation with mildly decreased systolic function. - Echo (3/22): EF 40% with diffuse hypokinesis, mild RV enlargement, mildly decreased RV systolic function.  9. Morbid obesity 10. ILR in place.  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 Complete heart block: s/p Medtronic CRT-D.  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 OSA: Untreated, unable to tolerate CPAP.  15. COVID-19 PNA 2/21.  16. Hyperlipidemia: Myalgias with multiple statins.  17. PAD: ABIs 10/21 noncompressible on right but TBI normal, normal ABI and TBI on left.   Social History   Socioeconomic History  . Marital status: Divorced    Spouse name: Not on file  . Number of children: 2  . Years of education: Not on file  . Highest education level: Not on file  Occupational History  . Occupation: retired/disabled. prev worked in Therapist, sports.    Employer: DISABLED  Tobacco Use  . Smoking status: Former Smoker    Packs/day: 2.00    Years: 30.00    Pack years: 60.00    Types: Cigarettes    Quit date: 10/16/2007    Years since quitting: 13.3  . Smokeless tobacco: Never Used  Vaping Use  . Vaping Use: Never used  Substance and Sexual Activity  . Alcohol use: No  . Drug use: No  . Sexual activity: Not Currently  Other Topics Concern  . Not on file  Social History Narrative   Lives alone.   Social Determinants of Health   Financial Resource Strain: Not on file  Food Insecurity: Not on file  Transportation Needs: Not on file  Physical Activity: Not on file  Stress: Not on file  Social Connections: Not on file  Intimate Partner Violence: Not on file   Family History  Problem Relation Age of Onset  . COPD Mother   . Asthma Mother   . Colon polyps Mother   . Allergies Mother   . Hypothyroidism Mother   . Asthma Maternal Grandmother   . Colon cancer Neg Hx    ROS: All systems reviewed and negative except as per HPI.   Current Outpatient Medications   Medication Sig Dispense Refill  . acetaminophen (TYLENOL) 500 MG tablet Take 500 mg by mouth every 6 (six) hours as needed.    [name] albuterol (VENTOLIN HFA) 108 (90 Base) MCG/ACT inhaler INHALE 2 PUFFS 4 TIMES A DAY as needed 54 g 3  . ALPRAZolam (XANAX) 0.5 MG tablet TAKE 2 TABLETS BY MOUTH AT BEDTIME 60 tablet 2  . AMBULATORY NON FORMULARY MEDICATION Rolling walker with a bench 1 Units 0  . augmented betamethasone dipropionate (DIPROLENE-AF) 0.05 % cream Apply topically.    . carisoprodol (SOMA) 350 MG tablet TAKE 1 TABLET BY MOUTH THREE TIMES A DAY AS NEEDED FOR MUSCLE SPASMS 90 tablet 5  . carvedilol (COREG) 12.5 MG tablet Take 1 tablet (12.5 mg total) by mouth 2 (two) times daily with a meal. 180 tablet 3  . dapagliflozin propanediol (FARXIGA) 10 MG  TABS tablet Take 1 tablet (10 mg total) by mouth daily before breakfast. 30 tablet 11  . digoxin (LANOXIN) 0.25 MG tablet TAKE 1/2 TABLET BY MOUTH EVERY DAY 45 tablet 3  . doxepin (SINEQUAN) 10 MG capsule Take 2 capsules (20 mg total) by mouth at bedtime as needed. 180 capsule 1  . Dupilumab (DUPIXENT) 300 MG/2ML SOPN Inject 1 Dose into the skin every 14 (fourteen) days.    . Evolocumab (REPATHA SURECLICK) 694 MG/ML SOAJ Inject 1 pen into the skin every 14 (fourteen) days. 6 mL 3  . fluticasone (FLONASE) 50 MCG/ACT nasal spray PLACE 2 SPRAYS INTO BOTH NOSTRILS DAILY AS NEEDED FOR ALLERGIES OR RHINITIS. 48 mL 1  . hydrOXYzine (ATARAX/VISTARIL) 10 MG tablet TAKE 1 TABLET (10 MG TOTAL) BY MOUTH 3 (THREE) TIMES DAILY AS NEEDED FOR ITCHING. 60 tablet 2  . lidocaine (LIDODERM) 5 % APPLY 1 PATCH TO AFFECTED AREA FOR UP TO 12 HOURS . DO NOT USE MORE THAN ONE PATCH IN 24 HOURS. 90 patch 2  . omeprazole (PRILOSEC) 20 MG capsule TAKE 1 CAPSULE BY MOUTH TWICE A DAY 180 capsule 1  . potassium chloride SA (KLOR-CON) 20 MEQ tablet Take 1 tablet (20 mEq total) by mouth daily. 90 tablet 3  . sacubitril-valsartan (ENTRESTO) 24-26 MG Take 1 tablet by mouth 2 (two)  times daily. 180 tablet 3  . spironolactone (ALDACTONE) 25 MG tablet TAKE 1 TABLET BY MOUTH EVERY DAY 90 tablet 3  . tamsulosin (FLOMAX) 0.4 MG CAPS capsule     . torsemide (DEMADEX) 20 MG tablet Take 4 tablets (80 mg total) by mouth in the morning AND 3 tablets (60 mg total) every evening. 240 tablet 3  . traMADol (ULTRAM) 50 MG tablet TAKE 1 TABLET (50 MG TOTAL) BY MOUTH DAILY AS NEEDED. 30 tablet 5  . traZODone (DESYREL) 100 MG tablet TAKE 1 TABLET BY MOUTH EVERY DAY AT BEDTIME AS NEEDED 90 tablet 1  . TUMS 500 MG chewable tablet Chew 500-2,000 mg by mouth every 4 (four) hours as needed for indigestion or heartburn.     . valACYclovir (VALTREX) 1000 MG tablet TAKE 1 TABLET BY MOUTH THREE TIMES A DAY 21 tablet 2  . venlafaxine XR (EFFEXOR-XR) 75 MG 24 hr capsule TAKE 1 CAPSULE BY MOUTH DAILY WITH BREAKFAST. 90 capsule 1  . warfarin (COUMADIN) 10 MG tablet Take 10 mg by mouth daily. Mon,wed,fri,sat    . zolpidem (AMBIEN) 10 MG tablet 1 by mouth at bedtime as needed 90 tablet 1  . metolazone (ZAROXOLYN) 2.5 MG tablet Take 1 tablet (2.5 mg total) by mouth as directed. (Patient not taking: Reported on 01/31/2021) 5 tablet 0   No current facility-administered medications for this encounter.   BP 122/88   Pulse 79   Wt 114.7 kg (252 lb 12.8 oz)   SpO2 96%   BMI 36.27 kg/m   Wt Readings from Last 3 Encounters:  01/31/21 114.7 kg (252 lb 12.8 oz)  01/17/21 119.3 kg (263 lb)  12/20/20 116.3 kg (256 lb 6.4 oz)   General: Walked in the clinic with canes. No resp difficulty HEENT: normal Neck: supple. no JVD. Carotids 2+ bilat; no bruits. No lymphadenopathy or thryomegaly appreciated. Cor: PMI nondisplaced. Regular rate & rhythm. No rubs, gallops or murmurs. Lungs: clear Abdomen: soft, nontender, nondistended. No hepatosplenomegaly. No bruits or masses. Good bowel sounds. Extremities: no cyanosis, clubbing, rash, edema Neuro: alert & orientedx3, cranial nerves grossly intact. moves all 4  extremities w/o difficulty. Affect pleasant .  Assessment/Plan:  1. Atrial fibrillation: Permanent.  Now s/p AV nodal ablation to allow effective BiV pacing.  - Continue warfarin (cannot afford DOACs).  - no bleeding issues.  2. COPD: No longer smoking.  Not using oxygen.  3. Chronic systolic CHF: Nonischemic cardiomyopathy based on 6/17 cath. However, he had a recent significant fall in EF from 30-35% to 10-15% on echo from 3/18.  LHC/RHC in 4/18 showed nonobstructive CAD and relatively optimized filling pressures.  He now has Medtronic CRT-D device with AV nodal ablation to allow BiV pacing.  Echo 6/19 with EF 25-30% with mildly decreased RV systolic function. CPX 7/19 was submaximal but likely only mild functional limitation from CHF.  Echo in 1/21 showed EF 35-40%, echo in 3/22 showed EF up to 40% with mildly decreased RV systolic function.  - NYHA III. Volume status stable.  - Continue torsemide 80 qam/60 qpm.   - Continue Coreg 12.5 mg bid.   - Continue spironolactone 25 mg daily.        - Increase entresto 49-51 mg twice a day. Check BMET and in 7 days.     - Continue  Farxiga 10 mg daily.  - Continue digoxin 0.125 daily, level 0.4 evel.  4. CAD: Nonobstructive on 4/18 cath.   - No chest pain. - He is on warfarin so no ASA.  - Statin myalgias, continue Repatha.  Check lipids today.    5. Complete heart block: Now has Medtronic CRT-D device and s/p AV nodal ablation.  6. Rash: Spongiotic dermatitis.  Improved.  7. OSA: Unable to tolerate CPAP.   8. Elevated LFTs: Most recent LFTs back to normal.   9. LLE wound/ulceration: Very slow to heal. ABIs not from 10/21 not remarkable.   Follow up with Dr [name] in 6 weeks. Screen for ANTHEM today.   Jeanmarie Hubert, NP  01/31/2021  [facility] [address] [facility] [place] [address] [phone] (office) [phone] (fax)

## 2021-01-31 NOTE — Patient Instructions (Signed)
INCREASE Entresto to 49/51 mg, one tab twice a day  Labs today We will only contact you if something comes back abnormal or we need to make some changes. Otherwise no news is good news!  Labs needed in 7-10 days  Your physician recommends that you schedule a follow-up appointment in: 6-8 weeks with Dr [name]  Do the following things EVERYDAY: 1) Weigh yourself in the morning before breakfast. Write it down and keep it in a log. 2) Take your medicines as prescribed 3) Eat low salt foods--Limit salt (sodium) to 2000 mg per day.  4) Stay as active as you can everyday 5) Limit all fluids for the day to less than 2 liters At the [facility], you and your health needs are our priority. As part of our continuing mission to provide you with exceptional heart care, we have created designated Provider Care Teams. These Care Teams include your primary Cardiologist (physician) and Advanced Practice Providers (APPs- Physician Assistants and Nurse Practitioners) who all work together to provide you with the care you need, when you need it.   You may see any of the following providers on your designated Care Team at your next follow up: [name] Dr Glori Bickers . Dr Loralie Champagne . Dr Vickki Muff . Darrick Grinder, NP . Lyda Jester, [place] . Audry Riles, PharmD   Please be sure to bring in all your medications bottles to every appointment.   If you have any questions or concerns before your next appointment please send [place] a message through [place] or call our office at [phone].    TO LEAVE A MESSAGE FOR THE NURSE SELECT OPTION 2, PLEASE LEAVE A MESSAGE INCLUDING: . YOUR NAME . DATE OF BIRTH . CALL BACK NUMBER . REASON FOR CALL**this is important as we prioritize the call backs  YOU WILL RECEIVE A CALL BACK THE SAME DAY AS LONG AS YOU CALL BEFORE 4:00 PM

## 2021-02-01 ENCOUNTER — Other Ambulatory Visit: Payer: Self-pay | Admitting: Internal Medicine

## 2021-02-01 DIAGNOSIS — Z8709 Personal history of other diseases of the respiratory system: Secondary | ICD-10-CM

## 2021-02-01 DIAGNOSIS — J3089 Other allergic rhinitis: Secondary | ICD-10-CM

## 2021-02-02 ENCOUNTER — Other Ambulatory Visit: Payer: Self-pay | Admitting: Internal Medicine

## 2021-02-02 NOTE — Telephone Encounter (Signed)
Please refill as per office routine med refill policy (all routine meds refilled for 3 mo or monthly per pt preference up to one year from last visit, then month to month grace period for 3 mo, then further med refills will have to be denied)  

## 2021-02-04 ENCOUNTER — Other Ambulatory Visit: Payer: Self-pay | Admitting: Internal Medicine

## 2021-02-04 DIAGNOSIS — I4819 Other persistent atrial fibrillation: Secondary | ICD-10-CM

## 2021-02-04 DIAGNOSIS — I428 Other cardiomyopathies: Secondary | ICD-10-CM

## 2021-02-04 DIAGNOSIS — I255 Ischemic cardiomyopathy: Secondary | ICD-10-CM

## 2021-02-06 DIAGNOSIS — Z006 Encounter for examination for normal comparison and control in clinical research program: Secondary | ICD-10-CM

## 2021-02-07 ENCOUNTER — Other Ambulatory Visit: Payer: Self-pay

## 2021-02-07 ENCOUNTER — Ambulatory Visit (HOSPITAL_COMMUNITY)
Admission: RE | Admit: 2021-02-07 | Discharge: 2021-02-07 | Disposition: A | Discharge status: Home / Self Care | Payer: Medicare HMO | Source: Ambulatory Visit | Attending: Cardiology | Admitting: Cardiology

## 2021-02-07 DIAGNOSIS — I5030 Unspecified diastolic (congestive) heart failure: Secondary | ICD-10-CM | POA: Insufficient documentation

## 2021-02-07 LAB — BASIC METABOLIC PANEL
Anion gap: 6 (ref 5–15)
BUN: 21 mg/dL (ref 8–23)
CO2: 28 mmol/L (ref 22–32)
Calcium: 8.6 mg/dL — ABNORMAL LOW (ref 8.9–10.3)
Chloride: 103 mmol/L (ref 98–111)
Creatinine, Ser: 1.23 mg/dL (ref 0.61–1.24)
GFR, Estimated: >60 mL/min (ref >60)
Glucose, Bld: 98 mg/dL (ref 70–99)
Potassium: 4.5 mmol/L (ref 3.5–5.1)
Sodium: 137 mmol/L (ref 135–145)

## 2021-02-08 NOTE — Progress Notes (Signed)
Remote ICD transmission.   

## 2021-02-09 ENCOUNTER — Encounter (HOSPITAL_BASED_OUTPATIENT_CLINIC_OR_DEPARTMENT_OTHER): Payer: Medicare HMO | Admitting: Internal Medicine

## 2021-02-09 DIAGNOSIS — L2089 Other atopic dermatitis: Secondary | ICD-10-CM | POA: Diagnosis not present

## 2021-02-10 NOTE — Research (Signed)
Subject ineligible for ANTHEM research trial due to NT proBNP requirements not being met x 2 attempts. The subject was made aware of ineligibility.

## 2021-02-10 NOTE — Progress Notes (Signed)
No ICM remote transmission received for 02/06/2021 and next ICM transmission scheduled for 03/20/2021.   

## 2021-02-13 ENCOUNTER — Other Ambulatory Visit: Payer: Self-pay

## 2021-02-13 ENCOUNTER — Encounter (HOSPITAL_BASED_OUTPATIENT_CLINIC_OR_DEPARTMENT_OTHER): Payer: Medicare HMO | Attending: Internal Medicine | Admitting: Internal Medicine

## 2021-02-13 DIAGNOSIS — I872 Venous insufficiency (chronic) (peripheral): Secondary | ICD-10-CM

## 2021-02-13 DIAGNOSIS — L97829 Non-pressure chronic ulcer of other part of left lower leg with unspecified severity: Secondary | ICD-10-CM | POA: Insufficient documentation

## 2021-02-13 DIAGNOSIS — E11622 Type 2 diabetes mellitus with other skin ulcer: Secondary | ICD-10-CM | POA: Insufficient documentation

## 2021-02-13 DIAGNOSIS — I110 Hypertensive heart disease with heart failure: Secondary | ICD-10-CM | POA: Insufficient documentation

## 2021-02-13 DIAGNOSIS — E1151 Type 2 diabetes mellitus with diabetic peripheral angiopathy without gangrene: Secondary | ICD-10-CM | POA: Diagnosis not present

## 2021-02-13 DIAGNOSIS — I5022 Chronic systolic (congestive) heart failure: Secondary | ICD-10-CM | POA: Diagnosis not present

## 2021-02-14 NOTE — Progress Notes (Addendum)
Zachary, [name] ([id number]) Visit Report for 02/13/2021 Chief Complaint Document Details Patient Name: Date of Service: [name] RD L. 02/13/2021 10:45 A M Medical Record Number: [id number] Patient Account Number: [id number] Date of Birth/Sex: Treating RN: 08/22/1956 (65 y.o. [name] Primary Care Provider: [name] Other Clinician: Referring Provider: Treating Provider/Extender: [name] in Treatment: 0 Information Obtained from: Patient Chief Complaint Left lower extremity wound Electronic Signature(s) Signed: 02/13/2021 1:59:09 PM By: [name] Entered By: Kalman Shan on 02/13/2021 12:50:43 -------------------------------------------------------------------------------- Debridement Details Patient Name: Date of Service: [name] RD L. 02/13/2021 10:45 A M Medical Record Number: [id number] Patient Account Number: [id number] Date of Birth/Sex: Treating RN: Jan 06, 1956 (65 y.o. [name] Primary Care Provider: [name] Other Clinician: Referring Provider: Treating Provider/Extender: [name] in Treatment: 0 Debridement Performed for Assessment: Wound #1 Left,Medial Lower Leg Performed By: Physician Kalman Shan, DO Debridement Type: Debridement Severity of Tissue Pre Debridement: Fat layer exposed Level of Consciousness (Pre-procedure): Awake and Alert Pre-procedure Verification/Time Out Yes - 11:30 Taken: Start Time: 11:33 Pain Control: Lidocaine 4% T opical Solution T Area Debrided (L x W): otal 2.5 (cm) x 2.2 (cm) = 5.5 (cm) Tissue and other material debrided: Viable, Non-Viable, Slough, Subcutaneous, Slough Level: Skin/Subcutaneous Tissue Debridement Description: Excisional Instrument: Curette Bleeding: Minimum Hemostasis Achieved: Pressure End Time: 11:37 Procedural Pain: 1 Post Procedural Pain: 0 Response to Treatment: Procedure was tolerated well Level of Consciousness  (Post- Awake and Alert procedure): Post Debridement Measurements of Total Wound Length: (cm) 2.5 Width: (cm) 2.2 Depth: (cm) 0.1 Volume: (cm) 0.432 Character of Wound/Ulcer Post Debridement: Improved Severity of Tissue Post Debridement: Fat layer exposed Post Procedure Diagnosis Same as Pre-procedure Electronic Signature(s) Signed: 02/13/2021 1:59:09 PM By: [name] Signed: 02/14/2021 5:08:22 PM By: Baruch Gouty RN, BSN Entered By: Baruch Gouty on 02/13/2021 12:17:03 -------------------------------------------------------------------------------- HPI Details Patient Name: Date of Service: [name] RD L. 02/13/2021 10:45 A M Medical Record Number: [id number] Patient Account Number: [id number] Date of Birth/Sex: Treating RN: 1956-01-05 (65 y.o. [name] Primary Care Provider: [name] Other Clinician: Referring Provider: Treating Provider/Extender: [name] in Treatment: 0 History of Present Illness HPI Description: Admission 5/2 [name] is a 65 year old male with a past medical history of systolic congestive heart failure, COPD, nonischemic cardiomyopathy, type 2 diabetes, chronic venous insufficiency that presents to the clinic for a 69-month history of nonhealing wound to his left lower extremity. He states that in November 2021 he fell off his porch and Hit his leg against a brick. The wound has healed mostly except for 1 area that is still open. He reports moderate Serosanguineous drainage daily. He has been keeping it covered with Band-Aids. He reports 2 rounds of antibiotics for this issue. He denies pain. He denies current acute signs of infection. Electronic Signature(s) Signed: 02/13/2021 1:59:09 PM By: [name] Entered By: Kalman Shan on 02/13/2021 12:58:00 -------------------------------------------------------------------------------- Physical Exam Details Patient Name: Date of Service: [name]  RD L. 02/13/2021 10:45 A M Medical Record Number: [id number] Patient Account Number: [id number] Date of Birth/Sex: Treating RN: 20-Jan-1956 (65 y.o. [name] Primary Care Provider: [name] Other Clinician: Referring Provider: Treating Provider/Extender: [name] in Treatment: 0 Constitutional respirations regular, non-labored and within target range for patient.. Cardiovascular 2+ dorsalis pedis/posterior tibialis pulses. Psychiatric pleasant and cooperative. Notes Left leg: The medial aspect has an Open wound. There is healthy  granulation tissue with scant necrotic tissue. No signs of infection. There is 3+ pitting edema to the thigh. Varicose veins. Venous stasis dermatitis Electronic Signature(s) Signed: 02/13/2021 1:59:09 PM By: [name] Signed: 02/13/2021 1:59:09 PM By: [name] Entered By: Kalman Shan on 02/13/2021 12:56:55 -------------------------------------------------------------------------------- Physician Orders Details Patient Name: Date of Service: [name] RD L. 02/13/2021 10:45 A M Medical Record Number: [id number] Patient Account Number: [id number] Date of Birth/Sex: Treating RN: 01-10-1956 (65 y.o. [name] Primary Care Provider: [name] Other Clinician: Referring Provider: Treating Provider/Extender: [name] in Treatment: 0 Verbal / Phone Orders: No Diagnosis Coding ICD-10 Coding Code Description [phone] Non-pressure chronic ulcer of other part of left lower leg with unspecified severity I87.2 Venous insufficiency (chronic) (peripheral) I10 Essential (primary) hypertension I42.8 Other cardiomyopathies J44.9 Chronic obstructive pulmonary disease, unspecified I48.19 Other persistent atrial fibrillation E11.9 Type 2 diabetes mellitus without complications R67.89 Chronic systolic (congestive) heart failure Follow-up Appointments Return Appointment in 1  week. Bathing/ Shower/ Hygiene May shower with protection but do not get wound dressing(s) wet. - use cast protector to keep wrap dry in the shower Edema Control - Lymphedema / SCD / Other Left Lower Extremity Elevate legs to the level of the heart or above for 30 minutes daily and/or when sitting, a frequency of: - throughout the day Avoid standing for long periods of time. Exercise regularly Wound Treatment Wound #1 - Lower Leg Wound Laterality: Left, Medial Peri-Wound Care: Sween Lotion (Moisturizing lotion) Discharge Instructions: Apply moisturizing lotion as directed Prim Dressing: Hydrofera Blue Classic Foam, 2x2 in ary Discharge Instructions: Moisten with saline prior to applying to wound bed Secondary Dressing: ABD Pad, 5x9 Discharge Instructions: Apply over primary dressing as directed. Compression Wrap: ThreePress (3 layer compression wrap) Discharge Instructions: Apply three layer compression as directed. Patient Medications llergies: No Known Allergies A Notifications Medication Indication Start End prior to debridement 02/13/2021 lidocaine DOSE topical 4 % cream - cream topical Electronic Signature(s) Signed: 02/13/2021 1:59:09 PM By: [name] Entered By: Kalman Shan on 02/13/2021 12:54:23 -------------------------------------------------------------------------------- Problem List Details Patient Name: Date of Service: [name] RD L. 02/13/2021 10:45 A M Medical Record Number: [id number] Patient Account Number: [id number] Date of Birth/Sex: Treating RN: 1956-05-29 (65 y.o. [name] Primary Care Provider: [name] Other Clinician: Referring Provider: Treating Provider/Extender: [name] in Treatment: 0 Active Problems ICD-10 Encounter Code Description Active Date MDM Diagnosis L97.829 Non-pressure chronic ulcer of other part of left lower leg with unspecified 02/13/2021 No Yes severity I87.2 Venous  insufficiency (chronic) (peripheral) 02/13/2021 No Yes I10 Essential (primary) hypertension 02/13/2021 No Yes I42.8 Other cardiomyopathies 02/13/2021 No Yes J44.9 Chronic obstructive pulmonary disease, unspecified 02/13/2021 No Yes I48.19 Other persistent atrial fibrillation 02/13/2021 No Yes E11.9 Type 2 diabetes mellitus without complications 11/19/8525 No Yes P82.42 Chronic systolic (congestive) heart failure 02/13/2021 No Yes Inactive Problems Resolved Problems Electronic Signature(s) Signed: 02/13/2021 1:59:09 PM By: [name] Entered By: Kalman Shan on 02/13/2021 12:50:23 -------------------------------------------------------------------------------- Progress Note Details Patient Name: Date of Service: [name] RD L. 02/13/2021 10:45 A M Medical Record Number: [id number] Patient Account Number: [id number] Date of Birth/Sex: Treating RN: 12-18-1955 (65 y.o. [name] Primary Care Provider: [name] Other Clinician: Referring Provider: Treating Provider/Extender: [name] in Treatment: 0 Subjective Chief Complaint Information obtained from Patient Left lower extremity wound History of Present Illness (HPI) Admission 5/2 Mr. Schulke is a 65 year old male with a  past medical history of systolic congestive heart failure, COPD, nonischemic cardiomyopathy, type 2 diabetes, chronic venous insufficiency that presents to the clinic for a 56-month history of nonhealing wound to his left lower extremity. He states that in November 2021 he fell off his porch and Hit his leg against a brick. The wound has healed mostly except for 1 area that is still open. He reports moderate Serosanguineous drainage daily. He has been keeping it covered with Band-Aids. He reports 2 rounds of antibiotics for this issue. He denies pain. He denies current acute signs of infection. Patient History Allergies No Known Allergies Family History Heart Disease - Father,  Hypertension - Mother,Father,Siblings, Lung Disease - Mother, Thyroid Problems - Mother, No family history of Cancer, Diabetes, Hereditary Spherocytosis, Kidney Disease, Seizures, Stroke, Tuberculosis. Social History Former smoker - quit 2009, Marital Status - Divorced, Alcohol Use - Never, Drug Use - No History, Caffeine Use - Daily - coffee, diet soda. Medical History Eyes Patient has history of Cataracts Denies history of Glaucoma, Optic Neuritis Hematologic/Lymphatic Patient has history of Anemia Respiratory Patient has history of Chronic Obstructive Pulmonary Disease (COPD), Sleep Apnea - sleeps in recliner Denies history of Asthma, Pneumothorax Cardiovascular Patient has history of Arrhythmia - afib, Congestive Heart Failure, Deep Vein Thrombosis, Myocardial Infarction, Peripheral Venous Disease Endocrine Patient has history of Type II Diabetes Denies history of Type I Diabetes Genitourinary Denies history of End Stage Renal Disease Integumentary (Skin) Denies history of History of Burn Musculoskeletal Patient has history of Osteoarthritis Oncologic Denies history of Received Chemotherapy, Received Radiation Psychiatric Denies history of Anorexia/bulimia, Confinement Anxiety Patient is treated with Controlled Diet. Blood sugar is not tested. Medical A Surgical History Notes nd Constitutional Symptoms (General Health) obesity Cardiovascular cardiomyopathy, pacemaker/defibulator Gastrointestinal GERD Endocrine thyrotoxicosis Genitourinary hx kidney stones, BPH Musculoskeletal thoracic and lumbosacral spondylosis Review of Systems (ROS) Constitutional Symptoms (General Health) Complains or has symptoms of Fatigue. Eyes Complains or has symptoms of Glasses / Contacts. Ear/Nose/Mouth/Throat Denies complaints or symptoms of Chronic sinus problems or rhinitis. Respiratory Complains or has symptoms of Shortness of Breath. Denies complaints or symptoms of Chronic or  frequent coughs. Cardiovascular Denies complaints or symptoms of Chest pain. Endocrine Denies complaints or symptoms of Heat/cold intolerance. Genitourinary Denies complaints or symptoms of Frequent urination. Integumentary (Skin) Complains or has symptoms of Wounds - left lower leg. Musculoskeletal Complains or has symptoms of Muscle Pain, Muscle Weakness. Neurologic Denies complaints or symptoms of Numbness/parasthesias. Psychiatric Denies complaints or symptoms of Claustrophobia, Suicidal. Objective Constitutional respirations regular, non-labored and within target range for patient.. Vitals Time Taken: 10:42 AM, Height: 70 in, Source: Stated, Weight: 260 lbs, Source: Stated, BMI: 37.3, Temperature: 98.1 F, Pulse: 72 bpm, Respiratory Rate: 18 breaths/min, Blood Pressure: 107/68 mmHg. Cardiovascular 2+ dorsalis pedis/posterior tibialis pulses. Psychiatric pleasant and cooperative. General Notes: Left leg: The medial aspect has an Open wound. There is healthy granulation tissue with scant necrotic tissue. No signs of infection. There is 3+ pitting edema to the thigh. Varicose veins. Venous stasis dermatitis Integumentary (Hair, Skin) Wound #1 status is Open. Original cause of wound was Trauma. The date acquired was: 09/06/2020. The wound is located on the Left,Medial Lower Leg. The wound measures 2.5cm length x 2.2cm width x 0.1cm depth; 4.32cm^2 area and 0.432cm^3 volume. There is Fat Layer (Subcutaneous Tissue) exposed. There is no tunneling or undermining noted. There is a medium amount of serosanguineous drainage noted. The wound margin is flat and intact. There is large (67- 100%) red granulation within the wound  bed. There is a small (1-33%) amount of necrotic tissue within the wound bed including Adherent Slough. Assessment Active Problems ICD-10 Non-pressure chronic ulcer of other part of left lower leg with unspecified severity Venous insufficiency (chronic)  (peripheral) Essential (primary) hypertension Other cardiomyopathies Chronic obstructive pulmonary disease, unspecified Other persistent atrial fibrillation Type 2 diabetes mellitus without complications Chronic systolic (congestive) heart failure Patient presents with a traumatic chronic nonhealing wound for the past 6 months. It has mostly healed except one area that is still open. There is necrotic Tissue that was debrided in office. The wound has healthy granulation tissue throughout postdebridement. There are no signs of infection. Patient has evidence of chronic venous insufficiency on exam. He does not use compression stockings. At this time I think he would benefit from [facility] due to the drainage under 3 layer compression. He also has a history of chronic systolic congestive heart failure. He is currently feeling well and at his baseline breathing. We discussed that if he feels short of breath he can take the compression wraps off and call our office. I do not suspect these will be an issue however since he has never had a wrap there could be fluid shifts causing him shortness of breath. He can follow-up in 1 week. Procedures Wound #1 Pre-procedure diagnosis of Wound #1 is a Vasculopathy located on the Left,Medial Lower Leg .Severity of Tissue Pre Debridement is: Fat layer exposed. There was a Excisional Skin/Subcutaneous Tissue Debridement with a total area of 5.5 sq cm performed by Kalman Shan, DO. With the following instrument(s): Curette to remove Viable and Non-Viable tissue/material. Material removed includes Subcutaneous Tissue and Slough and after achieving pain control using Lidocaine 4% T opical Solution. No specimens were taken. A time out was conducted at 11:30, prior to the start of the procedure. A Minimum amount of bleeding was controlled with Pressure. The procedure was tolerated well with a pain level of 1 throughout and a pain level of 0 following  the procedure. Post Debridement Measurements: 2.5cm length x 2.2cm width x 0.1cm depth; 0.432cm^3 volume. Character of Wound/Ulcer Post Debridement is improved. Severity of Tissue Post Debridement is: Fat layer exposed. Post procedure Diagnosis Wound #1: Same as Pre-Procedure Pre-procedure diagnosis of Wound #1 is a Vasculopathy located on the Left,Medial Lower Leg . There was a Three Layer Compression Therapy Procedure by Baruch Gouty, RN. Post procedure Diagnosis Wound #1: Same as Pre-Procedure Plan Follow-up Appointments: Return Appointment in 1 week. Bathing/ Shower/ Hygiene: May shower with protection but do not get wound dressing(s) wet. - use cast protector to keep wrap dry in the shower Edema Control - Lymphedema / SCD / Other: Elevate legs to the level of the heart or above for 30 minutes daily and/or when sitting, a frequency of: - throughout the day Avoid standing for long periods of time. Exercise regularly The following medication(s) was prescribed: lidocaine topical 4 % cream cream topical for prior to debridement was prescribed at facility WOUND #1: - Lower Leg Wound Laterality: Left, Medial Peri-Wound Care: Sween Lotion (Moisturizing lotion) Discharge Instructions: Apply moisturizing lotion as directed Prim Dressing: Hydrofera Blue Classic Foam, 2x2 in ary Discharge Instructions: Moisten with saline prior to applying to wound bed Secondary Dressing: ABD Pad, 5x9 Discharge Instructions: Apply over primary dressing as directed. Com pression Wrap: ThreePress (3 layer compression wrap) Discharge Instructions: Apply three layer compression as directed. 1. Hydrofera Blue with 3 layer compression wrap 2. In office sharp debridement 3. Follow-up in 1 week Electronic Signature(s)  Signed: 02/13/2021 1:59:09 PM By: [name] Entered By: Kalman Shan on 02/13/2021 13:01:04 -------------------------------------------------------------------------------- HxROS  Details Patient Name: Date of Service: [name], [name] 02/13/2021 10:45 A M Medical Record Number: [id number] Patient Account Number: [id number] Date of Birth/Sex: Treating RN: 05/09/1956 (65 y.o. [name] Primary Care Provider: [name] Other Clinician: Referring Provider: Treating Provider/Extender: [name] in Treatment: 0 Constitutional Symptoms (General Health) Complaints and Symptoms: Positive for: Fatigue Medical History: Past Medical History Notes: obesity Eyes Complaints and Symptoms: Positive for: Glasses / Contacts Medical History: Positive for: Cataracts Negative for: Glaucoma; Optic Neuritis Ear/Nose/Mouth/Throat Complaints and Symptoms: Negative for: Chronic sinus problems or rhinitis Respiratory Complaints and Symptoms: Positive for: Shortness of Breath Negative for: Chronic or frequent coughs Medical History: Positive for: Chronic Obstructive Pulmonary Disease (COPD); Sleep Apnea - sleeps in recliner Negative for: Asthma; Pneumothorax Cardiovascular Complaints and Symptoms: Negative for: Chest pain Medical History: Positive for: Arrhythmia - afib; Congestive Heart Failure; Deep Vein Thrombosis; Myocardial Infarction; Peripheral Venous Disease Past Medical History Notes: cardiomyopathy, pacemaker/defibulator Endocrine Complaints and Symptoms: Negative for: Heat/cold intolerance Medical History: Positive for: Type II Diabetes Negative for: Type I Diabetes Past Medical History Notes: thyrotoxicosis Time with diabetes: 10 yrs Treated with: Diet Blood sugar tested every day: No Genitourinary Complaints and Symptoms: Negative for: Frequent urination Medical History: Negative for: End Stage Renal Disease Past Medical History Notes: hx kidney stones, BPH Integumentary (Skin) Complaints and Symptoms: Positive for: Wounds - left lower leg Medical History: Negative for: History of  Burn Musculoskeletal Complaints and Symptoms: Positive for: Muscle Pain; Muscle Weakness Medical History: Positive for: Osteoarthritis Past Medical History Notes: thoracic and lumbosacral spondylosis Neurologic Complaints and Symptoms: Negative for: Numbness/parasthesias Psychiatric Complaints and Symptoms: Negative for: Claustrophobia; Suicidal Medical History: Negative for: Anorexia/bulimia; Confinement Anxiety Hematologic/Lymphatic Medical History: Positive for: Anemia Gastrointestinal Medical History: Past Medical History Notes: GERD Immunological Oncologic Medical History: Negative for: Received Chemotherapy; Received Radiation HBO Extended History Items Eyes: Cataracts Immunizations Pneumococcal Vaccine: Received Pneumococcal Vaccination: Yes Implantable Devices Yes Family and Social History Cancer: No; Diabetes: No; Heart Disease: Yes - Father; Hereditary Spherocytosis: No; Hypertension: Yes - Mother,Father,Siblings; Kidney Disease: No; Lung Disease: Yes - Mother; Seizures: No; Stroke: No; Thyroid Problems: Yes - Mother; Tuberculosis: No; Former smoker - quit 2009; Marital Status - Divorced; Alcohol Use: Never; Drug Use: No History; Caffeine Use: Daily - coffee, diet soda; Financial Concerns: No; Food, Clothing or Shelter Needs: No; Support System Lacking: No; Transportation Concerns: No Electronic Signature(s) Signed: 02/13/2021 1:59:09 PM By: [name] Signed: 02/14/2021 5:08:22 PM By: Baruch Gouty RN, BSN Entered By: Baruch Gouty on 02/13/2021 11:01:14 -------------------------------------------------------------------------------- [place] Details Patient Name: Date of Service: [name] RD L. 02/13/2021 Medical Record Number: [id number] Patient Account Number: [id number] Date of Birth/Sex: Treating RN: 26-Mar-1956 (65 y.o. [name] Primary Care Provider: [name] Other Clinician: Referring Provider: Treating Provider/Extender:  [name] in Treatment: 0 Diagnosis Coding ICD-10 Codes Code Description [phone] Non-pressure chronic ulcer of other part of left lower leg with unspecified severity I87.2 Venous insufficiency (chronic) (peripheral) I10 Essential (primary) hypertension I42.8 Other cardiomyopathies J44.9 Chronic obstructive pulmonary disease, unspecified I48.19 Other persistent atrial fibrillation E11.9 Type 2 diabetes mellitus without complications [id number] Chronic systolic (congestive) heart failure Facility Procedures CPT4 Code: [id number] Description: 99213 - WOUND CARE VISIT-LEV 3 EST PT Modifier: 25 Quantity: 1 CPT4 Code: [id number] Description: 11042 - DEB SUBQ TISSUE 20 SQ CM/< ICD-10 Diagnosis Description L97.829 Non-pressure  chronic ulcer of other part of left lower leg with unspecified s Modifier: everity Quantity: 1 Physician Procedures : CPT4 Code Description Modifier [id number] WC PHYS LEVEL 3 NEW PT 25 ICD-10 Diagnosis Description L97.829 Non-pressure chronic ulcer of other part of left lower leg with unspecified severity I87.2 Venous insufficiency (chronic) (peripheral) Quantity: 1 : [id number] - WC PHYS SUBQ TISS 20 SQ CM ICD-10 Diagnosis Description L97.829 Non-pressure chronic ulcer of other part of left lower leg with unspecified severity Quantity: 1 Electronic Signature(s) Signed: 02/14/2021 5:11:57 PM By: Baruch Gouty RN, BSN Signed: 02/21/2021 3:23:56 PM By: [name] Previous Signature: 02/13/2021 1:59:09 PM Version By: [name] Entered By: Baruch Gouty on 02/14/2021 17:11:13

## 2021-02-14 NOTE — Progress Notes (Signed)
BENITO, LEMMERMAN ([id number]) Visit Report for 02/13/2021 Allergy List Details Patient Name: Date of Service: [name] RD L. 02/13/2021 10:45 A M Medical Record Number: [id number] Patient Account Number: [id number] Date of Birth/Sex: Treating RN: 03-Jun-1956 (65 y.o. Male) Baruch Gouty Primary Care Arsema Tusing: [name] Other Clinician: Referring Kreg Earhart: Treating Tammey Deeg/Extender: [name] in Treatment: 0 Allergies Active Allergies No Known Allergies Allergy Notes Electronic Signature(s) Signed: 02/14/2021 5:08:22 PM By: Baruch Gouty RN, BSN Entered By: Baruch Gouty on 02/13/2021 10:44:28 -------------------------------------------------------------------------------- [place] Details Patient Name: Date of Service: [name] RD L. 02/13/2021 10:45 A M Medical Record Number: [id number] Patient Account Number: [id number] Date of Birth/Sex: Treating RN: 1956/03/30 (65 y.o. Male) Baruch Gouty Primary Care Nely Dedmon: [name] Other Clinician: Referring Keyra Virella: Treating Jayland Null/Extender: [name] in Treatment: 0 Visit Information Patient Arrived: Lyndel Pleasure Time: 10:41 Accompanied By: self Transfer Assistance: None Patient Identification Verified: Yes Secondary Verification Process Completed: Yes Patient Requires Transmission-Based Precautions: No Patient Has Alerts: Yes Patient Alerts: R ABI= 1.33, L ABI=1.16 Electronic Signature(s) Signed: 02/14/2021 5:08:22 PM By: Baruch Gouty RN, BSN Entered By: Baruch Gouty on 02/13/2021 11:22:22 -------------------------------------------------------------------------------- Clinic Level of Care Assessment Details Patient Name: Date of Service: [name] RD L. 02/13/2021 10:45 A M Medical Record Number: [id number] Patient Account Number: [id number] Date of Birth/Sex: Treating RN: 05/09/1956 (65 y.o. Male) Baruch Gouty Primary Care Shubham Thackston: [name]  Other Clinician: Referring Deniesha Stenglein: Treating Ayako Tapanes/Extender: [name] in Treatment: 0 Clinic Level of Care Assessment Items TOOL 1 Quantity Score []  - 0 Use when EandM and Procedure is performed on INITIAL visit ASSESSMENTS - Nursing Assessment / Reassessment X- 1 20 General Physical Exam (combine w/ comprehensive assessment (listed just below) when performed on new pt. evals) X- 1 25 Comprehensive Assessment (HX, ROS, Risk Assessments, Wounds Hx, etc.) ASSESSMENTS - Wound and Skin Assessment / Reassessment []  - 0 Dermatologic / Skin Assessment (not related to wound area) ASSESSMENTS - Ostomy and/or Continence Assessment and Care []  - 0 Incontinence Assessment and Management []  - 0 Ostomy Care Assessment and Management (repouching, etc.) PROCESS - Coordination of Care X - Simple Patient / Family Education for ongoing care 1 15 []  - 0 Complex (extensive) Patient / Family Education for ongoing care X- 1 10 Staff obtains Programmer, systems, Records, T Results / Process Orders est []  - 0 Staff telephones HHA, Nursing Homes / Clarify orders / etc []  - 0 Routine Transfer to another Facility (non-emergent condition) []  - 0 Routine Hospital Admission (non-emergent condition) X- 1 15 New Admissions / Biomedical engineer / Ordering NPWT Apligraf, etc. , []  - 0 Emergency Hospital Admission (emergent condition) PROCESS - Special Needs []  - 0 Pediatric / Minor Patient Management []  - 0 Isolation Patient Management []  - 0 Hearing / Language / Visual special needs []  - 0 Assessment of Community assistance (transportation, D/C planning, etc.) []  - 0 Additional assistance / Altered mentation []  - 0 Support Surface(s) Assessment (bed, cushion, seat, etc.) INTERVENTIONS - Miscellaneous []  - 0 External ear exam []  - 0 Patient Transfer (multiple staff / Civil Service fast streamer / Similar devices) []  - 0 Simple Staple / Suture removal (25 or less) []  - 0 Complex  Staple / Suture removal (26 or more) []  - 0 Hypo/Hyperglycemic Management (do not check if billed separately) []  - 0 Ankle / Brachial Index (ABI) - do not check if billed separately Has the patient been seen at the hospital within the  last three years: Yes Total Score: 85 Level Of Care: New/Established - Level 3 Electronic Signature(s) Signed: 02/14/2021 5:08:22 PM By: Baruch Gouty RN, BSN Entered By: Baruch Gouty on 02/13/2021 11:43:13 -------------------------------------------------------------------------------- Compression Therapy Details Patient Name: Date of Service: [name] RD L. 02/13/2021 10:45 A M Medical Record Number: [id number] Patient Account Number: [id number] Date of Birth/Sex: Treating RN: Mar 04, 1956 (65 y.o. Male) Baruch Gouty Primary Care Kezia Benevides: [name] Other Clinician: Referring Topacio Cella: Treating Christia Coaxum/Extender: [name] in Treatment: 0 Compression Therapy Performed for Wound Assessment: Wound #1 Left,Medial Lower Leg Performed By: Clinician Baruch Gouty, RN Compression Type: Three Layer Post Procedure Diagnosis Same as Pre-procedure Electronic Signature(s) Signed: 02/14/2021 5:08:22 PM By: Baruch Gouty RN, BSN Entered By: Baruch Gouty on 02/13/2021 12:15:57 -------------------------------------------------------------------------------- Lower Extremity Assessment Details Patient Name: Date of Service: [name] RD L. 02/13/2021 10:45 A M Medical Record Number: [id number] Patient Account Number: [id number] Date of Birth/Sex: Treating RN: Feb 16, 1956 (65 y.o. Male) Baruch Gouty Primary Care Yamili Lichtenwalner: [name] Other Clinician: Referring Breionna Punt: Treating Braison Snoke/Extender: [name] in Treatment: 0 Edema Assessment Assessed: [name]: No] [Right: No] Edema: [Left: Yes] [Right: Yes] Calf Left: Right: Point of Measurement: From Medial Instep 34.5 cm 37 cm Ankle Left:  Right: Point of Measurement: From Medial Instep 24.2 cm 24.5 cm Vascular Assessment Pulses: Dorsalis Pedis Palpable: [Left:Yes] [Right:Yes] Electronic Signature(s) Signed: 02/14/2021 5:08:22 PM By: Baruch Gouty RN, BSN Entered By: Baruch Gouty on 02/13/2021 11:10:18 -------------------------------------------------------------------------------- Multi Wound Chart Details Patient Name: Date of Service: [name] RD L. 02/13/2021 10:45 A M Medical Record Number: [id number] Patient Account Number: [id number] Date of Birth/Sex: Treating RN: 11-10-1955 (65 y.o. Male) Baruch Gouty Primary Care Lamont Tant: [name] Other Clinician: Referring Daney Moor: Treating Tilak Oakley/Extender: [name] in Treatment: 0 Vital Signs Height(in): 70 Pulse(bpm): 40 Weight(lbs): 260 Blood Pressure(mmHg): 107/68 Body Mass Index(BMI): 37 Temperature(F): 98.1 Respiratory Rate(breaths/min): 18 Photos: [1:No Photos Left, Medial Lower Leg] [N/A:N/A N/A] Wound Location: [1:Trauma] [N/A:N/A] Wounding Event: [1:Vasculopathy] [N/A:N/A] Primary Etiology: [1:Diabetic Wound/Ulcer of the Lower] [N/A:N/A] Secondary Etiology: [1:Extremity Cataracts, Anemia, Chronic] [N/A:N/A] Comorbid History: [1:Obstructive Pulmonary Disease (COPD), Sleep Apnea, Arrhythmia, Congestive Heart Failure, Deep Vein Thrombosis, Myocardial Infarction, Peripheral Venous Disease, Type II Diabetes, Osteoarthritis 09/06/2020] [N/A:N/A] Date Acquired: [1:0] [N/A:N/A] Weeks of Treatment: [1:Open] [N/A:N/A] Wound Status: [1:2.5x2.2x0.1] [N/A:N/A] Measurements L x W x D (cm) [1:4.32] [N/A:N/A] A (cm) : rea [1:0.432] [N/A:N/A] Volume (cm) : [1:Full Thickness Without Exposed] [N/A:N/A] Classification: [1:Support Structures Medium] [N/A:N/A] Exudate A mount: [1:Serosanguineous] [N/A:N/A] Exudate Type: [1:red, brown] [N/A:N/A] Exudate Color: [1:Flat and Intact] [N/A:N/A] Wound Margin: [1:Large (67-100%)]  [N/A:N/A] Granulation A mount: [1:Red] [N/A:N/A] Granulation Quality: [1:Small (1-33%)] [N/A:N/A] Necrotic A mount: [1:Fat Layer (Subcutaneous Tissue): Yes N/A] Exposed Structures: [1:Fascia: No Tendon: No Muscle: No Joint: No Bone: No Small (1-33%)] [N/A:N/A] Epithelialization: [1:Debridement - Excisional] [N/A:N/A] Debridement: Pre-procedure Verification/Time Out 11:30 [N/A:N/A] Taken: [1:Lidocaine 4% Topical Solution] [N/A:N/A] Pain Control: [1:Subcutaneous, Slough] [N/A:N/A] Tissue Debrided: [1:Skin/Subcutaneous Tissue] [N/A:N/A] Level: [1:5.5] [N/A:N/A] Debridement A (sq cm): [1:rea Curette] [N/A:N/A] Instrument: [1:Minimum] [N/A:N/A] Bleeding: [1:Pressure] [N/A:N/A] Hemostasis A chieved: [1:1] [N/A:N/A] Procedural Pain: [1:0] [N/A:N/A] Post Procedural Pain: [1:Procedure was tolerated well] [N/A:N/A] Debridement Treatment Response: [1:2.5x2.2x0.1] [N/A:N/A] Post Debridement Measurements L x W x D (cm) [1:0.432] [N/A:N/A] Post Debridement Volume: (cm) [1:Compression Therapy] [N/A:N/A] Procedures Performed: [1:Debridement] Treatment Notes Electronic Signature(s) Signed: 02/13/2021 1:59:09 PM By: [name] Signed: 02/14/2021 5:08:22 PM By: Baruch Gouty RN, BSN Entered By: Kalman Shan on 02/13/2021  12:50:28 -------------------------------------------------------------------------------- Multi-Disciplinary Care Plan Details Patient Name: Date of Service: [name] RD L. 02/13/2021 10:45 A M Medical Record Number: [id number] Patient Account Number: [id number] Date of Birth/Sex: Treating RN: [birth year] (65 y.o. Male) Baruch Gouty Primary Care Melisa Donofrio: [name] Other Clinician: Referring Cyla Haluska: Treating [name]/Extender: [name] in Treatment: 0 Multidisciplinary Care Plan reviewed with physician Active Inactive Abuse / Safety / Falls / Self Care Management Nursing Diagnoses: History of Falls Potential for  falls Goals: Patient/caregiver will verbalize/demonstrate measures taken to prevent injury and/or falls Date Initiated: 02/13/2021 Target Resolution Date: 03/13/2021 Goal Status: Active Interventions: Assess fall risk on admission and as needed Assess impairment of mobility on admission and as needed per policy Notes: Venous Leg Ulcer Nursing Diagnoses: Knowledge deficit related to disease process and management Potential for venous Insuffiency (use before diagnosis confirmed) Goals: Patient will maintain optimal edema control Date Initiated: 02/13/2021 Target Resolution Date: 03/13/2021 Goal Status: Active Interventions: Assess peripheral edema status every visit. Compression as ordered Provide education on venous insufficiency Treatment Activities: Therapeutic compression applied : 02/13/2021 Notes: Wound/Skin Impairment Nursing Diagnoses: Impaired tissue integrity Knowledge deficit related to ulceration/compromised skin integrity Goals: Patient/caregiver will verbalize understanding of skin care regimen Date Initiated: 02/13/2021 Target Resolution Date: 03/13/2021 Goal Status: Active Ulcer/skin breakdown will have a volume reduction of 30% by week 4 Date Initiated: 02/13/2021 Target Resolution Date: 03/13/2021 Goal Status: Active Interventions: Assess patient/caregiver ability to obtain necessary supplies Assess patient/caregiver ability to perform ulcer/skin care regimen upon admission and as needed Assess ulceration(s) every visit Provide education on ulcer and skin care Treatment Activities: Skin care regimen initiated : 02/13/2021 Topical wound management initiated : 02/13/2021 Notes: Electronic Signature(s) Signed: 02/14/2021 5:08:22 PM By: Baruch Gouty RN, BSN Entered By: Baruch Gouty on 02/13/2021 11:30:56 -------------------------------------------------------------------------------- Pain Assessment Details Patient Name: Date of Service: [name] RD L.  02/13/2021 10:45 A M Medical Record Number: [id number] Patient Account Number: [id number] Date of Birth/Sex: Treating RN: [birth year] (65 y.o. Male) Baruch Gouty Primary Care Gerturde Kuba: [name] Other Clinician: Referring Izic Stfort: Treating Behr Cislo/Extender: [name] in Treatment: 0 Active Problems Location of Pain Severity and Description of Pain Patient Has Paino No Site Locations Rate the pain. Current Pain Level: 0 Pain Management and Medication Current Pain Management: Electronic Signature(s) Signed: 02/14/2021 5:08:22 PM By: Baruch Gouty RN, BSN Entered By: Baruch Gouty on 02/13/2021 11:16:23 -------------------------------------------------------------------------------- Patient/Caregiver Education Details Patient Name: Date of Service: YO RK, RICHA RD L. 5/2/2022andnbsp10:45 A M Medical Record Number: [id number] Patient Account Number: [id number] Date of Birth/Gender: Treating RN: [birth year] (65 y.o. Male) Baruch Gouty Primary Care Physician: [name] Other Clinician: Referring Physician: Treating Physician/Extender: [name] in Treatment: 0 Education Assessment Education Provided To: Patient Education Topics Provided Safety: Methods: Explain/Verbal Responses: Reinforcements needed, State content correctly Venous: Handouts: Controlling Swelling with Multilayered Compression Wraps Methods: Explain/Verbal, Printed Responses: Reinforcements needed, State content correctly [place]: o Handouts: Welcome T [facility] [place] o Methods: Explain/Verbal, Printed Responses: Reinforcements needed, State content correctly Wound/Skin Impairment: Methods: Explain/Verbal Responses: Reinforcements needed, State content correctly Electronic Signature(s) Signed: 02/14/2021 5:08:22 PM By: Baruch Gouty RN, BSN Entered By: Baruch Gouty on 02/13/2021  11:32:29 -------------------------------------------------------------------------------- Wound Assessment Details Patient Name: Date of Service: [name] RD L. 02/13/2021 10:45 A M Medical Record Number: [id number] Patient Account Number: [id number] Date of Birth/Sex: Treating RN: 1955/12/03 (65 y.o. Male) Baruch Gouty Primary Care Kaylon Hitz: [name] Other Clinician: Referring  Tressy Kunzman: Treating Jovahn Breit/Extender: [name] in Treatment: 0 Wound Status Wound Number: 1 Primary Vasculopathy Etiology: Wound Location: Left, Medial Lower Leg Secondary Diabetic Wound/Ulcer of the Lower Extremity Wounding Event: Trauma Etiology: Date Acquired: 09/06/2020 Wound Open Weeks Of Treatment: 0 Status: Clustered Wound: No Comorbid Cataracts, Anemia, Chronic Obstructive Pulmonary Disease History: (COPD), Sleep Apnea, Arrhythmia, Congestive Heart Failure, Deep Vein Thrombosis, Myocardial Infarction, Peripheral Venous Disease, Type II Diabetes, Osteoarthritis Photos Wound Measurements Length: (cm) 2.5 Width: (cm) 2.2 Depth: (cm) 0.1 Area: (cm) 4.32 Volume: (cm) 0.432 % Reduction in Area: 0% % Reduction in Volume: 0% Epithelialization: Small (1-33%) Tunneling: No Undermining: No Wound Description Classification: Full Thickness Without Exposed Support Structures Wound Margin: Flat and Intact Exudate Amount: Medium Exudate Type: Serosanguineous Exudate Color: red, brown Foul Odor After Cleansing: No Slough/Fibrino No Wound Bed Granulation Amount: Large (67-100%) Exposed Structure Granulation Quality: Red Fascia Exposed: No Necrotic Amount: Small (1-33%) Fat Layer (Subcutaneous Tissue) Exposed: Yes Necrotic Quality: Adherent Slough Tendon Exposed: No Muscle Exposed: No Joint Exposed: No Bone Exposed: No Electronic Signature(s) Signed: 02/13/2021 5:00:30 PM By: Sandre Kitty Signed: 02/14/2021 5:08:22 PM By: Baruch Gouty RN, BSN Entered  By: Sandre Kitty on 02/13/2021 16:45:53 -------------------------------------------------------------------------------- [place] Details Patient Name: Date of Service: [name] RD L. 02/13/2021 10:45 A M Medical Record Number: [id number] Patient Account Number: [id number] Date of Birth/Sex: Treating RN: [birth year] (65 y.o. Male) Baruch Gouty Primary Care Melodee Lupe: [name] Other Clinician: Referring Allaya Abbasi: Treating Dione Mccombie/Extender: [name] in Treatment: 0 Vital Signs Time Taken: 10:42 Temperature (F): 98.1 Height (in): 70 Pulse (bpm): 72 Source: Stated Respiratory Rate (breaths/min): 18 Weight (lbs): 260 Blood Pressure (mmHg): 107/68 Source: Stated Reference Range: 80 - 120 mg / dl Body Mass Index (BMI): 37.3 Electronic Signature(s) Signed: 02/14/2021 5:08:22 PM By: Baruch Gouty RN, BSN Entered By: Baruch Gouty on 02/13/2021 10:43:50

## 2021-02-14 NOTE — Progress Notes (Signed)
Zachary, [name] ([id number]) Visit Report for 02/13/2021 Abuse/Suicide Risk Screen Details Patient Name: Date of Service: [name] RD L. 02/13/2021 10:45 A M Medical Record Number: [id number] Patient Account Number: [id number] Date of Birth/Sex: Treating RN: 08/17/1956 (65 y.o. Male) [name] Primary Care Azavier Creson: [name] Other Clinician: Referring Shields Pautz: Treating Xavian Hardcastle/Extender: [name] in Treatment: 0 Abuse/Suicide Risk Screen Items Answer ABUSE RISK SCREEN: Has anyone close to you tried to hurt or harm you recentlyo No Do you feel uncomfortable with anyone in your familyo No Has anyone forced you do things that you didnt want to doo No Electronic Signature(s) Signed: 02/14/2021 5:08:22 PM By: Zachary Gouty RN, BSN Entered By: [name] on 02/13/2021 11:02:18 -------------------------------------------------------------------------------- Activities of Daily Living Details Patient Name: Date of Service: [name] RD L. 02/13/2021 10:45 A M Medical Record Number: [id number] Patient Account Number: [id number] Date of Birth/Sex: Treating RN: October 12, 1956 (65 y.o. Male) [name] Primary Care Acie Custis: [name] Other Clinician: Referring Josslyn Ciolek: Treating Lilyauna Miedema/Extender: [name] in Treatment: 0 Activities of Daily Living Items Answer Activities of Daily Living (Please select one for each item) Drive Automobile Completely Able T Medications ake Completely Able Use T elephone Completely Able Care for Appearance Completely Able Use T oilet Completely Able Bath / Shower Completely Able Dress Self Completely Able Feed Self Completely Able Walk Need Assistance Get In / Out Bed Completely Able Housework Need Assistance Prepare Meals Need Assistance Handle Money Completely Able Shop for Self Need Assistance Electronic Signature(s) Signed: 02/14/2021 5:08:22 PM By: Zachary Gouty RN,  BSN Entered By: [name] on 02/13/2021 11:03:13 -------------------------------------------------------------------------------- Education Screening Details Patient Name: Date of Service: [name] RD L. 02/13/2021 10:45 A M Medical Record Number: [id number] Patient Account Number: [id number] Date of Birth/Sex: Treating RN: 07/18/1956 (65 y.o. Male) [name] Primary Care Jeanni Allshouse: [name] Other Clinician: Referring Cathi Hazan: Treating Ly Wass/Extender: [name] in Treatment: 0 Primary Learner Assessed: Patient Learning Preferences/Education Level/Primary Language Learning Preference: Explanation, Demonstration, Printed Material Highest Education Level: High School Preferred Language: English Cognitive Barrier Language Barrier: No Translator Needed: No Memory Deficit: No Emotional Barrier: No Cultural/Religious Beliefs Affecting Medical Care: No Physical Barrier Impaired Vision: Yes Glasses Impaired Hearing: No Decreased Hand dexterity: No Knowledge/Comprehension Knowledge Level: High Comprehension Level: High Ability to understand written instructions: High Ability to understand verbal instructions: High Motivation Anxiety Level: Calm Cooperation: Cooperative Education Importance: Acknowledges Need Interest in Health Problems: Asks Questions Perception: Coherent Willingness to Engage in Self-Management High Activities: Readiness to Engage in Self-Management High Activities: Electronic Signature(s) Signed: 02/14/2021 5:08:22 PM By: Zachary Gouty RN, BSN Entered By: [name] on 02/13/2021 11:03:55 -------------------------------------------------------------------------------- Fall Risk Assessment Details Patient Name: Date of Service: [name] RD L. 02/13/2021 10:45 A M Medical Record Number: [id number] Patient Account Number: [id number] Date of Birth/Sex: Treating RN: 04-15-1956 (65 y.o. Male) [name] Primary Care Angelito Hopping: [name] Other Clinician: Referring Birdella Sippel: Treating Sada Mazzoni/Extender: [name] in Treatment: 0 Fall Risk Assessment Items Have you had 2 or more falls in the last 12 monthso 0 Yes Have you had any fall that resulted in injury in the last 12 monthso 0 Yes FALLS RISK SCREEN History of falling - immediate or within 3 months 0 No Secondary diagnosis (Do you have 2 or more medical diagnoseso) 15 Yes Ambulatory aid None/bed rest/wheelchair/nurse 0 No Crutches/cane/walker 15 Yes Furniture 0 No Intravenous therapy Access/Saline/Heparin Lock 0 No Gait/Transferring Normal/  bed rest/ wheelchair 0 No Weak (short steps with or without shuffle, stooped but able to lift head while walking, may seek 10 Yes support from furniture) Impaired (short steps with shuffle, may have difficulty arising from chair, head down, impaired 0 No balance) Mental Status Oriented to own ability 0 Yes Electronic Signature(s) Signed: 02/14/2021 5:08:22 PM By: Zachary Gouty RN, BSN Entered By: [name] on 02/13/2021 11:04:50 -------------------------------------------------------------------------------- Foot Assessment Details Patient Name: Date of Service: [name] RD L. 02/13/2021 10:45 A M Medical Record Number: [id number] Patient Account Number: [id number] Date of Birth/Sex: Treating RN: Aug 22, 1956 (65 y.o. Male) [name] Primary Care Joseth Weigel: [name] Other Clinician: Referring Griffin Gerrard: Treating Korbin Notaro/Extender: [name] in Treatment: 0 Foot Assessment Items Site Locations + = Sensation present, - = Sensation absent, C = Callus, U = Ulcer R = Redness, W = Warmth, M = Maceration, PU = Pre-ulcerative lesion F = Fissure, S = Swelling, D = Dryness Assessment Right: Left: Other Deformity: No No Prior Foot Ulcer: No No Prior Amputation: No No Charcot Joint: No No Ambulatory Status: Ambulatory  With Help Assistance Device: Cane Gait: Steady Electronic Signature(s) Signed: 02/14/2021 5:08:22 PM By: Zachary Gouty RN, BSN Entered By: [name] on 02/13/2021 11:08:09 -------------------------------------------------------------------------------- Nutrition Risk Screening Details Patient Name: Date of Service: [name] RD L. 02/13/2021 10:45 A M Medical Record Number: [id number] Patient Account Number: [id number] Date of Birth/Sex: Treating RN: 29-Apr-1956 (65 y.o. Male) [name] Primary Care Jacob Chamblee: [name] Other Clinician: Referring Koray Soter: Treating Meyah Corle/Extender: [name] in Treatment: 0 Height (in): 70 Weight (lbs): 260 Body Mass Index (BMI): 37.3 Nutrition Risk Screening Items Score Screening NUTRITION RISK SCREEN: I have an illness or condition that made me change the kind and/or amount of food I eat 0 No I eat fewer than two meals per day 0 No I eat few fruits and vegetables, or milk products 0 No I have three or more drinks of beer, liquor or wine almost every day 0 No I have tooth or mouth problems that make it hard for me to eat 0 No I don't always have enough money to buy the food I need 0 No I eat alone most of the time 0 No I take three or more different prescribed or over-the-counter drugs a day 1 Yes Without wanting to, I have lost or gained 10 pounds in the last six months 0 No I am not always physically able to shop, cook and/or feed myself 0 No Nutrition Protocols Good Risk Protocol 0 No interventions needed Moderate Risk Protocol High Risk Proctocol Risk Level: Good Risk Score: 1 Electronic Signature(s) Signed: 02/14/2021 5:08:22 PM By: Zachary Gouty RN, BSN Entered By: [name] on 02/13/2021 11:05:13

## 2021-02-16 ENCOUNTER — Other Ambulatory Visit: Payer: Self-pay | Admitting: Internal Medicine

## 2021-02-20 ENCOUNTER — Encounter: Payer: Self-pay | Admitting: Family Medicine

## 2021-02-20 ENCOUNTER — Other Ambulatory Visit: Payer: Self-pay

## 2021-02-20 ENCOUNTER — Encounter (HOSPITAL_BASED_OUTPATIENT_CLINIC_OR_DEPARTMENT_OTHER): Payer: Medicare HMO | Admitting: Internal Medicine

## 2021-02-20 DIAGNOSIS — I872 Venous insufficiency (chronic) (peripheral): Secondary | ICD-10-CM

## 2021-02-20 DIAGNOSIS — E1151 Type 2 diabetes mellitus with diabetic peripheral angiopathy without gangrene: Secondary | ICD-10-CM | POA: Diagnosis not present

## 2021-02-20 DIAGNOSIS — L97829 Non-pressure chronic ulcer of other part of left lower leg with unspecified severity: Secondary | ICD-10-CM

## 2021-02-20 DIAGNOSIS — I5022 Chronic systolic (congestive) heart failure: Secondary | ICD-10-CM | POA: Diagnosis not present

## 2021-02-20 DIAGNOSIS — E11622 Type 2 diabetes mellitus with other skin ulcer: Secondary | ICD-10-CM | POA: Diagnosis not present

## 2021-02-20 DIAGNOSIS — I110 Hypertensive heart disease with heart failure: Secondary | ICD-10-CM | POA: Diagnosis not present

## 2021-02-21 ENCOUNTER — Other Ambulatory Visit: Payer: Self-pay

## 2021-02-21 ENCOUNTER — Ambulatory Visit (INDEPENDENT_AMBULATORY_CARE_PROVIDER_SITE_OTHER): Payer: Medicare HMO

## 2021-02-21 ENCOUNTER — Telehealth: Payer: Self-pay

## 2021-02-21 DIAGNOSIS — I5022 Chronic systolic (congestive) heart failure: Secondary | ICD-10-CM | POA: Diagnosis not present

## 2021-02-21 DIAGNOSIS — Z95810 Presence of automatic (implantable) cardiac defibrillator: Secondary | ICD-10-CM

## 2021-02-21 DIAGNOSIS — M1712 Unilateral primary osteoarthritis, left knee: Secondary | ICD-10-CM

## 2021-02-21 NOTE — Telephone Encounter (Signed)
Spoke with pt who reports he has had some increased bilateral LEE but he is dealing with venous stasis ulcers that are being treated by wound care.  He states he was advised yesterday swelling is decreasing after having legs measured.  Pt is also being treated by Dr [name] who has increased his [place]ide.  He is scheduled to have a remote transmission to be reviewed by Deniece Ree today.  Pt has follow up with Dr [name] scheduled for 03/2021.  Reviewed ED precautions.  Pt verbalizes understanding and thanked Therapist, sports for the call.

## 2021-02-21 NOTE — Telephone Encounter (Signed)
-----   Message from Deboraha Sprang, MD sent at 02/18/2021  9:36 AM EDT ----- Device remote reviewed. Remote is normal.  Battery status is good.  Lead measurements unchanged.  Histograms are appropriate.   M could you call him and see if he is having any CHF symptoms with optivol increased Thanks SK

## 2021-02-21 NOTE — Progress Notes (Signed)
EPIC Encounter for ICM Monitoring  Patient Name: [name] is a 65 y.o. male Date: 02/21/2021 Primary Care Physican: [name], MD Primary Cardiologist:[place] Electrophysiologist:Klein Bi-V Pacing:100% 02/21/2021 Weight: 263 lbs   Spoke with patient.  Patient's reports legs are swollen with venus ulcers and physician told him it was related to poor circulation.  Venus ulcers are being treated.  Pt sent my chart message to Dr [name] today regarding fluid.  He reports weight fluctuates and may be up a few pounds.  Optivol thoracic impedancenormal but does have a few days since 01/29/2021 with possible fluid accumulation.  Impedance does not appear to correlate with the severity of his leg swelling which may be influenced by venous ulcer problems.  Impedance was suggesting possible larger amount of fluid accumulation from 3/18-4/15.  Prescribed:  Torsemide 20 mg to4tablets (80 mg total)and 3 tablets (60 mg total) every evening.  Potassium 20 mEq take 1 tablet daily  Spironolactone 25 mg take 1 tablet daily.  Labs: 02/07/2021 Creatinine 1.23, BUN 21, Potassium 4.5, Sodium 137, GFR >60 01/31/2021 Creatinine 1.27, BUN 24, Potassium 4.1, Sodium 136, GFR >60  01/23/2021 Creatinine 1.14, BUN 26, Potassium 4.4, Sodium 138, GFR >60  12/20/2020 Creatinine 1.43, BUN 42, Potassium 4.0, Sodium 135, GFR 54  A complete set of results can be found in Results Review.  Recommendations:Pt does admit to eating foods high in salt and advised to limit salt intake.   Follow-up plan: ICM clinic phone appointment on6/20/2022. 91 day device clinic remote transmission7/13/2022  EP/Cardiology Office Visits:03/28/2021 withDr [name].  Copy of ICM check sent to Dewey and nurse Keitha Butte.   3 month ICM trend: 02/19/2021.    1 Year ICM trend:       [name], RN 02/21/2021 9:39 AM

## 2021-02-21 NOTE — Progress Notes (Signed)
[name] ([id number]) Visit Report for 02/20/2021 Chief Complaint Document Details Patient Name: Date of Service: [name] RD L. 02/20/2021 11:15 A M Medical Record Number: [id number] Patient Account Number: [id number] Date of Birth/Sex: Treating RN: [birth year] (65 y.o. [name] Primary Care Provider: Cathlean [name] Other Clinician: Referring Provider: Treating Provider/Extender: [name] in Treatment: 1 Information Obtained from: Patient Chief Complaint Left lower extremity wound Electronic Signature(s) Signed: 02/20/2021 12:27:55 PM By: Zachary Shan DO Entered By: [name] on 02/20/2021 12:25:24 -------------------------------------------------------------------------------- Debridement Details Patient Name: Date of Service: [name] RD L. 02/20/2021 11:15 A M Medical Record Number: [id number] Patient Account Number: [id number] Date of Birth/Sex: Treating RN: 08-11-1956 (65 y.o. [name] Primary Care Provider: Cathlean [name] Other Clinician: Referring Provider: Treating Provider/Extender: [name] in Treatment: 1 Debridement Performed for Assessment: Wound #1 Left,Medial Lower Leg Performed By: Physician Zachary Shan, DO Debridement Type: Debridement Severity of Tissue Pre Debridement: Fat layer exposed Level of Consciousness (Pre-procedure): Awake and Alert Pre-procedure Verification/Time Out Yes - 12:17 Taken: Start Time: 12:17 T Area Debrided (L x W): otal 2.5 (cm) x 1.9 (cm) = 4.75 (cm) Tissue and other material debrided: Viable, Non-Viable, Slough, Subcutaneous, Slough Level: Skin/Subcutaneous Tissue Debridement Description: Excisional Instrument: Curette Bleeding: Minimum Hemostasis Achieved: Pressure End Time: 12:18 Procedural Pain: 0 Post Procedural Pain: 0 Response to Treatment: Procedure was tolerated well Level of Consciousness (Post- Awake and Alert procedure): Post  Debridement Measurements of Total Wound Length: (cm) 2.5 Width: (cm) 1.9 Depth: (cm) 0.1 Volume: (cm) 0.373 Character of Wound/Ulcer Post Debridement: Improved Severity of Tissue Post Debridement: Fat layer exposed Post Procedure Diagnosis Same as Pre-procedure Electronic Signature(s) Signed: 02/20/2021 12:27:55 PM By: Zachary Shan DO Signed: 02/20/2021 5:39:20 PM By: Zachary Hurst RN, BSN Entered By: [name] on 02/20/2021 12:20:11 -------------------------------------------------------------------------------- HPI Details Patient Name: Date of Service: [name] RD L. 02/20/2021 11:15 A M Medical Record Number: [id number] Patient Account Number: [id number] Date of Birth/Sex: Treating RN: Jun 20, 1956 (65 y.o. [name] Primary Care Provider: Cathlean [name] Other Clinician: Referring Provider: Treating Provider/Extender: [name] in Treatment: 1 History of Present Illness HPI Description: Admission 5/2 Mr. Caravello is a 65 year old male with a past medical history of systolic congestive heart failure, COPD, nonischemic cardiomyopathy, type 2 diabetes, chronic venous insufficiency that presents to the clinic for a 79-month history of nonhealing wound to his left lower extremity. He states that in November 2021 he fell off his porch and Hit his leg against a brick. The wound has healed mostly except for 1 area that is still open. He reports moderate Serosanguineous drainage daily. He has been keeping it covered with Band-Aids. He reports 2 rounds of antibiotics for this issue. He denies pain. He denies current acute signs of infection. 5/9; patient presents for 1 week follow-up. He has been using Hydrofera Blue under 3 layer compression. He has no issues or complaints today. He denies signs of infection. Electronic Signature(s) Signed: 02/20/2021 12:27:55 PM By: Zachary Shan DO Entered By: [name] on 02/20/2021  12:25:50 -------------------------------------------------------------------------------- Physical Exam Details Patient Name: Date of Service: [name] RD L. 02/20/2021 11:15 A M Medical Record Number: [id number] Patient Account Number: [id number] Date of Birth/Sex: Treating RN: May 22, 1956 (65 y.o. [name] Primary Care Provider: Cathlean [name] Other Clinician: Referring Provider: Treating Provider/Extender: [name] in Treatment: 1 Constitutional respirations regular, non-labored and within target range for patient.. Cardiovascular  2+ dorsalis pedis/posterior tibialis pulses. Psychiatric pleasant and cooperative. Notes Left lower extremity: The medial aspect has an open wound with granulation tissue present. The surface is a bit gritty With some nonviable tissue present. There is an [place] of epithelialization in the proximal aspect. Overall this appears healing. No signs of infection. Electronic Signature(s) Signed: 02/20/2021 12:27:55 PM By: Zachary Shan DO Entered By: [name] on 02/20/2021 12:26:08 -------------------------------------------------------------------------------- Physician Orders Details Patient Name: Date of Service: [name], [name] RD L. 02/20/2021 11:15 A M Medical Record Number: [id number] Patient Account Number: [id number] Date of Birth/Sex: Treating RN: 10-20-1955 (65 y.o. [name] Primary Care Provider: Cathlean [name] Other Clinician: Referring Provider: Treating Provider/Extender: [name] in Treatment: 1 Verbal / Phone Orders: No Diagnosis Coding ICD-10 Coding Code Description [phone] Non-pressure chronic ulcer of other part of left lower leg with unspecified severity I87.2 Venous insufficiency (chronic) (peripheral) I10 Essential (primary) hypertension I42.8 Other cardiomyopathies J44.9 Chronic obstructive pulmonary disease, unspecified I48.19 Other persistent atrial  fibrillation E11.9 Type 2 diabetes mellitus without complications [id number] Chronic systolic (congestive) heart failure Follow-up Appointments Return Appointment in 1 week. Bathing/ Shower/ Hygiene May shower with protection but do not get wound dressing(s) wet. - use cast protector to keep wrap dry in the shower Edema Control - Lymphedema / SCD / Other Left Lower Extremity Elevate legs to the level of the heart or above for 30 minutes daily and/or when sitting, a frequency of: - throughout the day Avoid standing for long periods of time. Exercise regularly Compression stocking or Garment 20-30 mm/Hg pressure to: - compression stocking to right leg daily Wound Treatment Wound #1 - Lower Leg Wound Laterality: Left, Medial Peri-Wound Care: Sween Lotion (Moisturizing lotion) 1 x Per Week Discharge Instructions: Apply moisturizing lotion as directed Prim Dressing: Hydrofera Blue Classic Foam, 2x2 in 1 x Per Week ary Discharge Instructions: Moisten with saline prior to applying to wound bed Secondary Dressing: Woven Gauze Sponge, Non-Sterile 4x4 in 1 x Per Week Discharge Instructions: Apply over primary dressing as directed. Compression Wrap: ThreePress (3 layer compression wrap) 1 x Per Week Discharge Instructions: Apply three layer compression as directed. Electronic Signature(s) Signed: 02/20/2021 12:27:55 PM By: Zachary Shan DO Entered By: [name] on 02/20/2021 12:26:21 -------------------------------------------------------------------------------- Problem List Details Patient Name: Date of Service: [name] RD L. 02/20/2021 11:15 A M Medical Record Number: [id number] Patient Account Number: [id number] Date of Birth/Sex: Treating RN: 02/09/1956 (65 y.o. [name] Primary Care Provider: Cathlean [name] Other Clinician: Referring Provider: Treating Provider/Extender: [name] in Treatment: 1 Active Problems ICD-10 Encounter Code  Description Active Date MDM Diagnosis [id number] Non-pressure chronic ulcer of other part of left lower leg with unspecified 02/13/2021 No Yes severity I87.2 Venous insufficiency (chronic) (peripheral) 02/13/2021 No Yes I10 Essential (primary) hypertension 02/13/2021 No Yes I42.8 Other cardiomyopathies 02/13/2021 No Yes J44.9 Chronic obstructive pulmonary disease, unspecified 02/13/2021 No Yes I48.19 Other persistent atrial fibrillation 02/13/2021 No Yes E11.9 Type 2 diabetes mellitus without complications [id number] No Yes [id number] Chronic systolic (congestive) heart failure 02/13/2021 No Yes Inactive Problems Resolved Problems Electronic Signature(s) Signed: 02/20/2021 12:27:55 PM By: Zachary Shan DO Entered By: [name] on 02/20/2021 12:25:09 -------------------------------------------------------------------------------- Progress Note Details Patient Name: Date of Service: [name] RD L. 02/20/2021 11:15 A M Medical Record Number: [id number] Patient Account Number: [id number] Date of Birth/Sex: Treating RN: 12/29/1955 (65 y.o. [name] Primary Care Provider: Other Clinician: Cathlean [name] Referring Provider: Treating Provider/Extender: Ihor Dow,  Casper Harrison in Treatment: 1 Subjective Chief Complaint Information obtained from Patient Left lower extremity wound History of Present Illness (HPI) Admission 5/2 Mr. Epp is a 65 year old male with a past medical history of systolic congestive heart failure, COPD, nonischemic cardiomyopathy, type 2 diabetes, chronic venous insufficiency that presents to the clinic for a 58-month history of nonhealing wound to his left lower extremity. He states that in November 2021 he fell off his porch and Hit his leg against a brick. The wound has healed mostly except for 1 area that is still open. He reports moderate Serosanguineous drainage daily. He has been keeping it covered with Band-Aids. He reports 2 rounds of antibiotics for this  issue. He denies pain. He denies current acute signs of infection. 5/9; patient presents for 1 week follow-up. He has been using Hydrofera Blue under 3 layer compression. He has no issues or complaints today. He denies signs of infection. Patient History Information obtained from Patient. Family History Heart Disease - Father, Hypertension - Mother,Father,Siblings, Lung Disease - Mother, Thyroid Problems - Mother, No family history of Cancer, Diabetes, Hereditary Spherocytosis, Kidney Disease, Seizures, Stroke, Tuberculosis. Social History Former smoker - quit 2009, Marital Status - Divorced, Alcohol Use - Never, Drug Use - No History, Caffeine Use - Daily - coffee, diet soda. Medical History Eyes Patient has history of Cataracts Denies history of Glaucoma, Optic Neuritis Hematologic/Lymphatic Patient has history of Anemia Respiratory Patient has history of Chronic Obstructive Pulmonary Disease (COPD), Sleep Apnea - sleeps in recliner Denies history of Asthma, Pneumothorax Cardiovascular Patient has history of Arrhythmia - afib, Congestive Heart Failure, Deep Vein Thrombosis, Myocardial Infarction, Peripheral Venous Disease Endocrine Patient has history of Type II Diabetes Denies history of Type I Diabetes Genitourinary Denies history of End Stage Renal Disease Integumentary (Skin) Denies history of History of Burn Musculoskeletal Patient has history of Osteoarthritis Oncologic Denies history of Received Chemotherapy, Received Radiation Psychiatric Denies history of Anorexia/bulimia, Confinement Anxiety Medical A Surgical History Notes nd Constitutional Symptoms (General Health) obesity Cardiovascular cardiomyopathy, pacemaker/defibulator Gastrointestinal GERD Endocrine thyrotoxicosis Genitourinary hx kidney stones, BPH Musculoskeletal thoracic and lumbosacral spondylosis Objective Constitutional respirations regular, non-labored and within target range for  patient.. Vitals Time Taken: 11:55 AM, Height: 70 in, Weight: 260 lbs, BMI: 37.3, Temperature: 98.1 F, Pulse: 76 bpm, Respiratory Rate: 18 breaths/min, Blood Pressure: 97/65 mmHg. Cardiovascular 2+ dorsalis pedis/posterior tibialis pulses. Psychiatric pleasant and cooperative. General Notes: Left lower extremity: The medial aspect has an open wound with granulation tissue present. The surface is a bit gritty With some nonviable tissue present. There is an [place] of epithelialization in the proximal aspect. Overall this appears healing. No signs of infection. Integumentary (Hair, Skin) Wound #1 status is Open. Original cause of wound was Trauma. The date acquired was: 09/06/2020. The wound has been in treatment 1 weeks. The wound is located on the Left,Medial Lower Leg. The wound measures 2.5cm length x 1.9cm width x 0.1cm depth; 3.731cm^2 area and 0.373cm^3 volume. There is Fat Layer (Subcutaneous Tissue) exposed. There is no tunneling or undermining noted. There is a medium amount of serosanguineous drainage noted. The wound margin is flat and intact. There is large (67-100%) red granulation within the wound bed. There is a small (1-33%) amount of necrotic tissue within the wound bed. Assessment Active Problems ICD-10 Non-pressure chronic ulcer of other part of left lower leg with unspecified severity Venous insufficiency (chronic) (peripheral) Essential (primary) hypertension Other cardiomyopathies Chronic obstructive pulmonary disease, unspecified Other persistent atrial fibrillation Type 2 diabetes mellitus  without complications Chronic systolic (congestive) heart failure Patient's wound has improved in appearance and size since last clinic visit. There are some nonviable tissue that was debrided. We will continue with Hydrofera Blue and 3 layer compression. He can follow-up in 1 week Procedures Wound #1 Pre-procedure diagnosis of Wound #1 is a Vasculopathy located on the  Left,Medial Lower Leg .Severity of Tissue Pre Debridement is: Fat layer exposed. There was a Excisional Skin/Subcutaneous Tissue Debridement with a total area of 4.75 sq cm performed by Zachary Shan, DO. With the following instrument(s): Curette to remove Viable and Non-Viable tissue/material. Material removed includes Subcutaneous Tissue and Slough and. No specimens were taken. A time out was conducted at 12:17, prior to the start of the procedure. A Minimum amount of bleeding was controlled with Pressure. The procedure was tolerated well with a pain level of 0 throughout and a pain level of 0 following the procedure. Post Debridement Measurements: 2.5cm length x 1.9cm width x 0.1cm depth; 0.373cm^3 volume. Character of Wound/Ulcer Post Debridement is improved. Severity of Tissue Post Debridement is: Fat layer exposed. Post procedure Diagnosis Wound #1: Same as Pre-Procedure Pre-procedure diagnosis of Wound #1 is a Vasculopathy located on the Left,Medial Lower Leg . There was a Three Layer Compression Therapy Procedure by Zachary Hurst, RN. Post procedure Diagnosis Wound #1: Same as Pre-Procedure Plan Follow-up Appointments: Return Appointment in 1 week. Bathing/ Shower/ Hygiene: May shower with protection but do not get wound dressing(s) wet. - use cast protector to keep wrap dry in the shower Edema Control - Lymphedema / SCD / Other: Elevate legs to the level of the heart or above for 30 minutes daily and/or when sitting, a frequency of: - throughout the day Avoid standing for long periods of time. Exercise regularly Compression stocking or Garment 20-30 mm/Hg pressure to: - compression stocking to right leg daily WOUND #1: - Lower Leg Wound Laterality: Left, Medial Peri-Wound Care: Sween Lotion (Moisturizing lotion) 1 x Per Week/ Discharge Instructions: Apply moisturizing lotion as directed Prim Dressing: Hydrofera Blue Classic Foam, 2x2 in 1 x Per Week/ ary Discharge Instructions:  Moisten with saline prior to applying to wound bed Secondary Dressing: Woven Gauze Sponge, Non-Sterile 4x4 in 1 x Per Week/ Discharge Instructions: Apply over primary dressing as directed. Com pression Wrap: ThreePress (3 layer compression wrap) 1 x Per Week/ Discharge Instructions: Apply three layer compression as directed. 1. In office sharp debridement 2. Hydrofera Blue with 3 layer pression therapy 3. Follow-up in 1 week Electronic Signature(s) Signed: 02/20/2021 5:39:20 PM By: Zachary Hurst RN, BSN Signed: 02/21/2021 3:06:19 PM By: Zachary Shan DO Previous Signature: 02/20/2021 12:27:55 PM Version By: Zachary Shan DO Entered By: [name] on 02/20/2021 13:54:30 -------------------------------------------------------------------------------- HxROS Details Patient Name: Date of Service: [name], [place] 02/20/2021 11:15 A M Medical Record Number: [id number] Patient Account Number: [id number] Date of Birth/Sex: Treating RN: 05-31-1956 (65 y.o. [name] Primary Care Provider: Cathlean [name] Other Clinician: Referring Provider: Treating Provider/Extender: [name] in Treatment: 1 Information Obtained From Patient Constitutional Symptoms (General Health) Medical History: Past Medical History Notes: obesity Eyes Medical History: Positive for: Cataracts Negative for: Glaucoma; Optic Neuritis Hematologic/Lymphatic Medical History: Positive for: Anemia Respiratory Medical History: Positive for: Chronic Obstructive Pulmonary Disease (COPD); Sleep Apnea - sleeps in recliner Negative for: Asthma; Pneumothorax Cardiovascular Medical History: Positive for: Arrhythmia - afib; Congestive Heart Failure; Deep Vein Thrombosis; Myocardial Infarction; Peripheral Venous Disease Past Medical History Notes: cardiomyopathy, pacemaker/defibulator Gastrointestinal Medical History: Past Medical  History Notes: GERD Endocrine Medical  History: Positive for: Type II Diabetes Negative for: Type I Diabetes Past Medical History Notes: thyrotoxicosis Time with diabetes: 10 yrs Treated with: Diet Blood sugar tested every day: No Genitourinary Medical History: Negative for: End Stage Renal Disease Past Medical History Notes: hx kidney stones, BPH Integumentary (Skin) Medical History: Negative for: History of Burn Musculoskeletal Medical History: Positive for: Osteoarthritis Past Medical History Notes: thoracic and lumbosacral spondylosis Oncologic Medical History: Negative for: Received Chemotherapy; Received Radiation Psychiatric Medical History: Negative for: Anorexia/bulimia; Confinement Anxiety HBO Extended History Items Eyes: Cataracts Immunizations Pneumococcal Vaccine: Received Pneumococcal Vaccination: Yes Implantable Devices Yes Family and Social History Cancer: No; Diabetes: No; Heart Disease: Yes - Father; Hereditary Spherocytosis: No; Hypertension: Yes - Mother,Father,Siblings; Kidney Disease: No; Lung Disease: Yes - Mother; Seizures: No; Stroke: No; Thyroid Problems: Yes - Mother; Tuberculosis: No; Former smoker - quit 2009; Marital Status - Divorced; Alcohol Use: Never; Drug Use: No History; Caffeine Use: Daily - coffee, diet soda; Financial Concerns: No; Food, Clothing or Shelter Needs: No; Support System Lacking: No; Transportation Concerns: No Electronic Signature(s) Signed: 02/20/2021 12:27:55 PM By: Zachary Shan DO Signed: 02/20/2021 5:39:20 PM By: Zachary Hurst RN, BSN Entered By: [name] on 02/20/2021 12:25:58 -------------------------------------------------------------------------------- [place] Details Patient Name: Date of Service: [name] RD L. 02/20/2021 Medical Record Number: [id number] Patient Account Number: [id number] Date of Birth/Sex: Treating RN: 06/05/1956 (65 y.o. [name] Primary Care Provider: Cathlean [name] Other Clinician: Referring  Provider: Treating Provider/Extender: [name] in Treatment: 1 Diagnosis Coding ICD-10 Codes Code Description [phone] Non-pressure chronic ulcer of other part of left lower leg with unspecified severity I87.2 Venous insufficiency (chronic) (peripheral) I10 Essential (primary) hypertension I42.8 Other cardiomyopathies J44.9 Chronic obstructive pulmonary disease, unspecified I48.19 Other persistent atrial fibrillation E11.9 Type 2 diabetes mellitus without complications [id number] Chronic systolic (congestive) heart failure Facility Procedures CPT4 Code: [id number] Description: [id number] - DEB SUBQ TISSUE 20 SQ CM/< ICD-10 Diagnosis Description L97.829 Non-pressure chronic ulcer of other part of left lower leg with unspecified sev I87.2 Venous insufficiency (chronic) (peripheral) Modifier: erity Quantity: 1 Physician Procedures : CPT4 Code Description Modifier [id number] - WC PHYS SUBQ TISS 20 SQ CM ICD-10 Diagnosis Description L97.829 Non-pressure chronic ulcer of other part of left lower leg with unspecified severity I87.2 Venous insufficiency (chronic) (peripheral) Quantity: 1 Electronic Signature(s) Signed: 02/20/2021 12:27:55 PM By: Zachary Shan DO Entered By: [name] on 02/20/2021 12:27:27

## 2021-02-22 ENCOUNTER — Inpatient Hospital Stay: Admit: 2021-02-22 | Payer: Medicare HMO | Admitting: Surgery

## 2021-02-22 SURGERY — VAGAL NERVE STIMULATOR IMPLANT
Anesthesia: Choice

## 2021-02-22 NOTE — Progress Notes (Signed)
TARRIN, LEBOW ([id number]) Visit Report for 02/20/2021 Arrival Information Details Patient Name: Date of Service: [name] RD L. 02/20/2021 11:15 A M Medical Record Number: [id number] Patient Account Number: [id number] Date of Birth/Sex: Treating RN: 1956-02-27 (65 y.o. [name] Primary Care Analaya Hoey: [name] Other Clinician: Referring Clancy Mullarkey: Treating Rogan Ecklund/Extender: [name] in Treatment: 1 Visit Information History Since Last Visit Added or deleted any medications: No Patient Arrived: [name] Any new allergies or adverse reactions: No Arrival Time: 11:54 Had a fall or experienced change in No Accompanied By: alone activities of daily living that may affect Transfer Assistance: None risk of falls: Patient Identification Verified: Yes Signs or symptoms of abuse/neglect since last visito No Secondary Verification Process Completed: Yes Hospitalized since last visit: No Patient Requires Transmission-Based Precautions: No Implantable device outside of the clinic excluding No Patient Has Alerts: Yes cellular tissue based products placed in the center Patient Alerts: R ABI= 1.33, L ABI=1.16 since last visit: Has Dressing in Place as Prescribed: Yes Has Compression in Place as Prescribed: Yes Pain Present Now: No Electronic Signature(s) Signed: 02/20/2021 5:39:20 PM By: Levan Hurst RN, BSN Entered By: Levan Hurst on 02/20/2021 11:54:54 -------------------------------------------------------------------------------- Compression Therapy Details Patient Name: Date of Service: [name] RD L. 02/20/2021 11:15 A M Medical Record Number: [id number] Patient Account Number: [id number] Date of Birth/Sex: Treating RN: 12-04-1955 (64 y.o. [name] Primary Care Daiya Tamer: [name] Other Clinician: Referring Christy Ehrsam: Treating Afton Mikelson/Extender: [name] in Treatment: 1 Compression Therapy Performed for  Wound Assessment: Wound #1 Left,Medial Lower Leg Performed By: Clinician Levan Hurst, RN Compression Type: Three Layer Post Procedure Diagnosis Same as Pre-procedure Electronic Signature(s) Signed: 02/20/2021 5:39:20 PM By: Levan Hurst RN, BSN Entered By: Levan Hurst on 02/20/2021 13:54:12 -------------------------------------------------------------------------------- Encounter Discharge Information Details Patient Name: Date of Service: [name], [name] 02/20/2021 11:15 A M Medical Record Number: [id number] Patient Account Number: [id number] Date of Birth/Sex: Treating RN: 11-08-1955 (65 y.o. [name] Primary Care Vieno Tarrant: [name] Other Clinician: Referring Lyndi Holbein: Treating Lenus Trauger/Extender: [name] in Treatment: 1 Encounter Discharge Information Items Post Procedure Vitals Discharge Condition: Stable Temperature (F): 98.1 Ambulatory Status: Cane Pulse (bpm): 76 Discharge Destination: Home Respiratory Rate (breaths/min): 18 Transportation: Private Auto Blood Pressure (mmHg): 97/65 Accompanied By: alone Schedule Follow-up Appointment: Yes Clinical Summary of Care: Patient Declined Electronic Signature(s) Signed: 02/20/2021 5:39:20 PM By: Levan Hurst RN, BSN Entered By: Levan Hurst on 02/20/2021 13:55:41 -------------------------------------------------------------------------------- Lower Extremity Assessment Details Patient Name: Date of Service: [name] RD L. 02/20/2021 11:15 A M Medical Record Number: [id number] Patient Account Number: [id number] Date of Birth/Sex: Treating RN: Aug 09, 1956 (66 y.o. [name], Lauren Primary Care Aalivia Mcgraw: [name] Other Clinician: Referring Alanni Vader: Treating Lakendra Helling/Extender: [name] in Treatment: 1 Edema Assessment Assessed: [name]: Yes] [name]: Yes] Edema: [Left: Yes] [Right: Yes] Calf Left: Right: Point of Measurement: From Medial  Instep 32.4 cm 34 cm Ankle Left: Right: Point of Measurement: From Medial Instep 21 cm 21.8 cm Knee To Floor Left: Right: From Medial Instep 44 cm 44 cm Vascular Assessment Pulses: Dorsalis Pedis Palpable: [Left:Yes] [Right:Yes] Posterior Tibial Palpable: [Left:Yes] [Right:Yes] Electronic Signature(s) Signed: 02/20/2021 5:39:20 PM By: Levan Hurst RN, BSN Signed: 02/22/2021 5:14:45 PM By: Rhae Hammock RN Entered By: Levan Hurst on 02/20/2021 12:23:51 -------------------------------------------------------------------------------- Multi Wound Chart Details Patient Name: Date of Service: [name] RD L. 02/20/2021 11:15 A M Medical Record Number: [id number] Patient  Account Number: [id number] Date of Birth/Sex: Treating RN: [birth year] (66 y.o. [name] Primary Care Kinda Pottle: [name] Other Clinician: Referring Caelie Remsburg: Treating Deziray Nabi/Extender: [name] in Treatment: 1 Vital Signs Height(in): 70 Pulse(bpm): 45 Weight(lbs): 260 Blood Pressure(mmHg): 97/65 Body Mass Index(BMI): 37 Temperature(F): 98.1 Respiratory Rate(breaths/min): 18 Photos: [1:No Photos Left, Medial Lower Leg] [N/A:N/A N/A] Wound Location: [1:Trauma] [N/A:N/A] Wounding Event: [1:Vasculopathy] [N/A:N/A] Primary Etiology: [1:Diabetic Wound/Ulcer of the Lower] [N/A:N/A] Secondary Etiology: [1:Extremity Cataracts, Anemia, Chronic] [N/A:N/A] Comorbid History: [1:Obstructive Pulmonary Disease (COPD), Sleep Apnea, Arrhythmia, Congestive Heart Failure, Deep Vein Thrombosis, Myocardial Infarction, Peripheral Venous Disease, Type II Diabetes, Osteoarthritis 09/06/2020] [N/A:N/A] Date Acquired: [1:1] [N/A:N/A] Weeks of Treatment: [1:Open] [N/A:N/A] Wound Status: [1:2.5x1.9x0.1] [N/A:N/A] Measurements L x W x D (cm) [1:3.731] [N/A:N/A] A (cm) : rea [1:0.373] [N/A:N/A] Volume (cm) : [1:13.60%] [N/A:N/A] % Reduction in A [1:rea: 13.70%] [N/A:N/A] % Reduction in  Volume: [1:Full Thickness Without Exposed] [N/A:N/A] Classification: [1:Support Structures Medium] [N/A:N/A] Exudate A mount: [1:Serosanguineous] [N/A:N/A] Exudate Type: [1:red, brown] [N/A:N/A] Exudate Color: [1:Flat and Intact] [N/A:N/A] Wound Margin: [1:Large (67-100%)] [N/A:N/A] Granulation A mount: [1:Red] [N/A:N/A] Granulation Quality: [1:Small (1-33%)] [N/A:N/A] Necrotic A mount: [1:Fat Layer (Subcutaneous Tissue): Yes N/A] Exposed Structures: [1:Fascia: No Tendon: No Muscle: No Joint: No Bone: No Small (1-33%)] [N/A:N/A] Epithelialization: [1:Debridement - Excisional] [N/A:N/A] Debridement: Pre-procedure Verification/Time Out 12:17 [N/A:N/A] Taken: [1:Subcutaneous, Slough] [N/A:N/A] Tissue Debrided: [1:Skin/Subcutaneous Tissue] [N/A:N/A] Level: [1:4.75] [N/A:N/A] Debridement A (sq cm): [1:rea Curette] [N/A:N/A] Instrument: [1:Minimum] [N/A:N/A] Bleeding: [1:Pressure] [N/A:N/A] Hemostasis A chieved: [1:0] [N/A:N/A] Procedural Pain: [1:0] [N/A:N/A] Post Procedural Pain: [1:Procedure was tolerated well] [N/A:N/A] Debridement Treatment Response: [1:2.5x1.9x0.1] [N/A:N/A] Post Debridement Measurements L x W x D (cm) [1:0.373] [N/A:N/A] Post Debridement Volume: (cm) [1:Debridement] [N/A:N/A] Treatment Notes Electronic Signature(s) Signed: 02/20/2021 12:27:55 PM By: [name] Signed: 02/20/2021 5:39:20 PM By: Levan Hurst RN, BSN Entered By: Kalman Shan on 02/20/2021 12:25:16 -------------------------------------------------------------------------------- Multi-Disciplinary Care Plan Details Patient Name: Date of Service: [name] RD L. 02/20/2021 11:15 A M Medical Record Number: [id number] Patient Account Number: [id number] Date of Birth/Sex: Treating RN: 13-Oct-1956 (65 y.o. [name] Primary Care Merton Wadlow: [name] Other Clinician: Referring Zechariah Bissonnette: Treating Cedricka Sackrider/Extender: [name] in Treatment:  1 Multidisciplinary Care Plan reviewed with physician Active Inactive Abuse / Safety / Falls / Self Care Management Nursing Diagnoses: History of Falls Potential for falls Goals: Patient/caregiver will verbalize/demonstrate measures taken to prevent injury and/or falls Date Initiated: 02/13/2021 Target Resolution Date: 03/13/2021 Goal Status: Active Interventions: Assess fall risk on admission and as needed Assess impairment of mobility on admission and as needed per policy Notes: Venous Leg Ulcer Nursing Diagnoses: Knowledge deficit related to disease process and management Potential for venous Insuffiency (use before diagnosis confirmed) Goals: Patient will maintain optimal edema control Date Initiated: 02/13/2021 Target Resolution Date: 03/13/2021 Goal Status: Active Interventions: Assess peripheral edema status every visit. Compression as ordered Provide education on venous insufficiency Treatment Activities: Therapeutic compression applied : 02/13/2021 Notes: Wound/Skin Impairment Nursing Diagnoses: Impaired tissue integrity Knowledge deficit related to ulceration/compromised skin integrity Goals: Patient/caregiver will verbalize understanding of skin care regimen Date Initiated: 02/13/2021 Target Resolution Date: 03/13/2021 Goal Status: Active Ulcer/skin breakdown will have a volume reduction of 30% by week 4 Date Initiated: 02/13/2021 Target Resolution Date: 03/13/2021 Goal Status: Active Interventions: Assess patient/caregiver ability to obtain necessary supplies Assess patient/caregiver ability to perform ulcer/skin care regimen upon admission and as needed Assess ulceration(s) every visit Provide education on ulcer and skin care Treatment Activities: Skin  care regimen initiated : 02/13/2021 Topical wound management initiated : 02/13/2021 Notes: Electronic Signature(s) Signed: 02/20/2021 5:39:20 PM By: Levan Hurst RN, BSN Entered By: Levan Hurst on 02/20/2021  12:20:19 -------------------------------------------------------------------------------- Pain Assessment Details Patient Name: Date of Service: [name] RD L. 02/20/2021 11:15 A M Medical Record Number: [id number] Patient Account Number: [id number] Date of Birth/Sex: Treating RN: 1956/10/14 (65 y.o. [name] Primary Care Ariam Mol: [name] Other Clinician: Referring Yves Fodor: Treating Maryna Yeagle/Extender: [name] in Treatment: 1 Active Problems Location of Pain Severity and Description of Pain Patient Has Paino No Site Locations Pain Management and Medication Current Pain Management: Electronic Signature(s) Signed: 02/20/2021 5:39:20 PM By: Levan Hurst RN, BSN Entered By: Levan Hurst on 02/20/2021 11:55:19 -------------------------------------------------------------------------------- Patient/Caregiver Education Details Patient Name: Date of Service: [name], [name] RD L. 5/9/2022andnbsp11:15 A M Medical Record Number: [id number] Patient Account Number: [id number] Date of Birth/Gender: Treating RN: 02/24/1956 (65 y.o. [name] Primary Care Physician: [name] Other Clinician: Referring Physician: Treating Physician/Extender: [name] in Treatment: 1 Education Assessment Education Provided To: Patient Education Topics Provided Venous: Methods: Explain/Verbal Responses: State content correctly Wound/Skin Impairment: Methods: Explain/Verbal Responses: State content correctly Electronic Signature(s) Signed: 02/20/2021 5:39:20 PM By: Levan Hurst RN, BSN Entered By: Levan Hurst on 02/20/2021 12:20:36 -------------------------------------------------------------------------------- Wound Assessment Details Patient Name: Date of Service: [name] RD L. 02/20/2021 11:15 A M Medical Record Number: [id number] Patient Account Number: [id number] Date of Birth/Sex: Treating RN: 01/24/1956 (65  y.o. [name] Primary Care Malcolm Hetz: [name] Other Clinician: Referring Ita Fritzsche: Treating Reva Pinkley/Extender: [name] in Treatment: 1 Wound Status Wound Number: 1 Primary Vasculopathy Etiology: Wound Location: Left, Medial Lower Leg Secondary Diabetic Wound/Ulcer of the Lower Extremity Wounding Event: Trauma Etiology: Date Acquired: 09/06/2020 Wound Open Weeks Of Treatment: 1 Status: Clustered Wound: No Comorbid Cataracts, Anemia, Chronic Obstructive Pulmonary Disease History: (COPD), Sleep Apnea, Arrhythmia, Congestive Heart Failure, Deep Vein Thrombosis, Myocardial Infarction, Peripheral Venous Disease, Type II Diabetes, Osteoarthritis Photos Wound Measurements Length: (cm) 2.5 Width: (cm) 1.9 Depth: (cm) 0.1 Area: (cm) 3.731 Volume: (cm) 0.373 % Reduction in Area: 13.6% % Reduction in Volume: 13.7% Epithelialization: Small (1-33%) Tunneling: No Undermining: No Wound Description Classification: Full Thickness Without Exposed Support Structures Wound Margin: Flat and Intact Exudate Amount: Medium Exudate Type: Serosanguineous Exudate Color: red, brown Foul Odor After Cleansing: No Slough/Fibrino No Wound Bed Granulation Amount: Large (67-100%) Exposed Structure Granulation Quality: Red Fascia Exposed: No Necrotic Amount: Small (1-33%) Fat Layer (Subcutaneous Tissue) Exposed: Yes Tendon Exposed: No Muscle Exposed: No Joint Exposed: No Bone Exposed: No Treatment Notes Wound #1 (Lower Leg) Wound Laterality: Left, Medial Cleanser Peri-Wound Care Sween Lotion (Moisturizing lotion) Discharge Instruction: Apply moisturizing lotion as directed Topical Primary Dressing Hydrofera Blue Classic Foam, 2x2 in Discharge Instruction: Moisten with saline prior to applying to wound bed Secondary Dressing Woven Gauze Sponge, Non-Sterile 4x4 in Discharge Instruction: Apply over primary dressing as directed. Secured  With Compression Wrap ThreePress (3 layer compression wrap) Discharge Instruction: Apply three layer compression as directed. Compression Stockings Add-Ons Electronic Signature(s) Signed: 02/22/2021 9:46:23 AM By: Sandre Kitty Signed: 02/22/2021 5:58:33 PM By: Levan Hurst RN, BSN Previous Signature: 02/20/2021 5:39:20 PM Version By: Levan Hurst RN, BSN Entered By: Sandre Kitty on 02/21/2021 16:14:25 -------------------------------------------------------------------------------- Vitals Details Patient Name: Date of Service: [name], [place] 02/20/2021 11:15 A M Medical Record Number: [id number] Patient Account Number: [id number] Date of Birth/Sex: Treating RN: 1956/01/31 (65 y.o. M)  Levan Hurst Primary Care Jalon Squier: [name] Other Clinician: Referring Davison Ohms: Treating Joniel Graumann/Extender: [name] in Treatment: 1 Vital Signs Time Taken: 11:55 Temperature (F): 98.1 Height (in): 70 Pulse (bpm): 76 Weight (lbs): 260 Respiratory Rate (breaths/min): 18 Body Mass Index (BMI): 37.3 Blood Pressure (mmHg): 97/65 Reference Range: 80 - 120 mg / dl Electronic Signature(s) Signed: 02/20/2021 5:39:20 PM By: Levan Hurst RN, BSN Entered By: Levan Hurst on 02/20/2021 11:55:13

## 2021-02-24 DIAGNOSIS — M1712 Unilateral primary osteoarthritis, left knee: Secondary | ICD-10-CM | POA: Diagnosis not present

## 2021-02-24 NOTE — Telephone Encounter (Signed)
thx

## 2021-02-27 ENCOUNTER — Telehealth (HOSPITAL_COMMUNITY): Payer: Self-pay | Admitting: *Deleted

## 2021-02-27 ENCOUNTER — Other Ambulatory Visit: Payer: Self-pay

## 2021-02-27 ENCOUNTER — Encounter (HOSPITAL_BASED_OUTPATIENT_CLINIC_OR_DEPARTMENT_OTHER): Payer: Medicare HMO | Admitting: Internal Medicine

## 2021-02-27 DIAGNOSIS — I872 Venous insufficiency (chronic) (peripheral): Secondary | ICD-10-CM | POA: Diagnosis not present

## 2021-02-27 DIAGNOSIS — I5022 Chronic systolic (congestive) heart failure: Secondary | ICD-10-CM | POA: Diagnosis not present

## 2021-02-27 DIAGNOSIS — L97829 Non-pressure chronic ulcer of other part of left lower leg with unspecified severity: Secondary | ICD-10-CM

## 2021-02-27 DIAGNOSIS — E11622 Type 2 diabetes mellitus with other skin ulcer: Secondary | ICD-10-CM | POA: Diagnosis not present

## 2021-02-27 DIAGNOSIS — E1151 Type 2 diabetes mellitus with diabetic peripheral angiopathy without gangrene: Secondary | ICD-10-CM | POA: Diagnosis not present

## 2021-02-27 DIAGNOSIS — I110 Hypertensive heart disease with heart failure: Secondary | ICD-10-CM | POA: Diagnosis not present

## 2021-02-27 NOTE — Telephone Encounter (Signed)
Pt left VM stating his bp has been low 90's/60's and he's been really tired the past two days. Also c/o numbness in left hand x3 days. No other complaints at this time.   Routed to [facility] for advice

## 2021-02-27 NOTE — Telephone Encounter (Signed)
If low BP persists, he can decrease Entresto to 24/26 bid (recently increased).

## 2021-02-28 ENCOUNTER — Encounter: Payer: Self-pay | Admitting: Internal Medicine

## 2021-02-28 ENCOUNTER — Ambulatory Visit (INDEPENDENT_AMBULATORY_CARE_PROVIDER_SITE_OTHER): Payer: Medicare HMO | Admitting: Internal Medicine

## 2021-02-28 ENCOUNTER — Ambulatory Visit (INDEPENDENT_AMBULATORY_CARE_PROVIDER_SITE_OTHER): Payer: Medicare HMO | Admitting: General Practice

## 2021-02-28 VITALS — BP 118/70 | HR 74 | Temp 97.9°F | Ht 70.0 in | Wt 249.0 lb

## 2021-02-28 DIAGNOSIS — R972 Elevated prostate specific antigen [PSA]: Secondary | ICD-10-CM

## 2021-02-28 DIAGNOSIS — E538 Deficiency of other specified B group vitamins: Secondary | ICD-10-CM | POA: Diagnosis not present

## 2021-02-28 DIAGNOSIS — E7800 Pure hypercholesterolemia, unspecified: Secondary | ICD-10-CM | POA: Diagnosis not present

## 2021-02-28 DIAGNOSIS — Z0000 Encounter for general adult medical examination without abnormal findings: Secondary | ICD-10-CM

## 2021-02-28 DIAGNOSIS — Z01818 Encounter for other preprocedural examination: Secondary | ICD-10-CM

## 2021-02-28 DIAGNOSIS — Z7901 Long term (current) use of anticoagulants: Secondary | ICD-10-CM | POA: Diagnosis not present

## 2021-02-28 DIAGNOSIS — S81812D Laceration without foreign body, left lower leg, subsequent encounter: Secondary | ICD-10-CM

## 2021-02-28 DIAGNOSIS — Z0001 Encounter for general adult medical examination with abnormal findings: Secondary | ICD-10-CM

## 2021-02-28 DIAGNOSIS — I10 Essential (primary) hypertension: Secondary | ICD-10-CM

## 2021-02-28 DIAGNOSIS — E559 Vitamin D deficiency, unspecified: Secondary | ICD-10-CM

## 2021-02-28 DIAGNOSIS — E1165 Type 2 diabetes mellitus with hyperglycemia: Secondary | ICD-10-CM | POA: Diagnosis not present

## 2021-02-28 LAB — BASIC METABOLIC PANEL
BUN: 30 mg/dL — ABNORMAL HIGH (ref 6–23)
CO2: 25 mEq/L (ref 19–32)
Calcium: 9.5 mg/dL (ref 8.4–10.5)
Chloride: 103 mEq/L (ref 96–112)
Creatinine, Ser: 1.29 mg/dL (ref 0.40–1.50)
GFR: 57.9 mL/min — ABNORMAL LOW (ref >60.00)
Glucose, Bld: 85 mg/dL (ref 70–99)
Potassium: 4.2 mEq/L (ref 3.5–5.1)
Sodium: 138 mEq/L (ref 135–145)

## 2021-02-28 LAB — LIPID PANEL
Cholesterol: 153 mg/dL (ref 0–200)
HDL: 33.6 mg/dL — ABNORMAL LOW (ref >39.00)
NonHDL: 119.25
Total CHOL/HDL Ratio: 5
Triglycerides: 335 mg/dL — ABNORMAL HIGH (ref 0.0–149.0)
VLDL: 67 mg/dL — ABNORMAL HIGH (ref 0.0–40.0)

## 2021-02-28 LAB — CBC WITH DIFFERENTIAL/PLATELET
Basophils Absolute: 0.1 10*3/uL (ref 0.0–0.1)
Basophils Relative: 0.6 % (ref 0.0–3.0)
Eosinophils Absolute: 0.2 10*3/uL (ref 0.0–0.7)
Eosinophils Relative: 2.8 % (ref 0.0–5.0)
HCT: 40.7 % (ref 39.0–52.0)
Hemoglobin: 14 g/dL (ref 13.0–17.0)
Lymphocytes Relative: 20.9 % (ref 12.0–46.0)
Lymphs Abs: 1.9 10*3/uL (ref 0.7–4.0)
MCHC: 34.4 g/dL (ref 30.0–36.0)
MCV: 101.6 fl — ABNORMAL HIGH (ref 78.0–100.0)
Monocytes Absolute: 0.8 10*3/uL (ref 0.1–1.0)
Monocytes Relative: 8.5 % (ref 3.0–12.0)
Neutro Abs: 6 10*3/uL (ref 1.4–7.7)
Neutrophils Relative %: 67.2 % (ref 43.0–77.0)
Platelets: 210 10*3/uL (ref 150.0–400.0)
RBC: 4 Mil/uL — ABNORMAL LOW (ref 4.22–5.81)
RDW: 13.6 % (ref 11.5–15.5)
WBC: 9 10*3/uL (ref 4.0–10.5)

## 2021-02-28 LAB — URINALYSIS, ROUTINE W REFLEX MICROSCOPIC
Bilirubin Urine: NEGATIVE
Hgb urine dipstick: NEGATIVE
Ketones, ur: NEGATIVE
Leukocytes,Ua: NEGATIVE
Nitrite: NEGATIVE
Specific Gravity, Urine: 1.015 (ref 1.000–1.030)
Total Protein, Urine: NEGATIVE
Urine Glucose: NEGATIVE
Urobilinogen, UA: 0.2 (ref 0.0–1.0)
pH: 5.5 (ref 5.0–8.0)

## 2021-02-28 LAB — HEPATIC FUNCTION PANEL
ALT: 24 U/L (ref 0–53)
AST: 21 U/L (ref 0–37)
Albumin: 4.2 g/dL (ref 3.5–5.2)
Alkaline Phosphatase: 51 U/L (ref 39–117)
Bilirubin, Direct: 0.1 mg/dL (ref 0.0–0.3)
Total Bilirubin: 0.6 mg/dL (ref 0.2–1.2)
Total Protein: 6.7 g/dL (ref 6.0–8.3)

## 2021-02-28 LAB — PSA: PSA: 2.26 ng/mL (ref 0.10–4.00)

## 2021-02-28 LAB — VITAMIN D 25 HYDROXY (VIT D DEFICIENCY, FRACTURES): VITD: 24.78 ng/mL — ABNORMAL LOW (ref 30.00–100.00)

## 2021-02-28 LAB — MICROALBUMIN / CREATININE URINE RATIO
Creatinine,U: 132.7 mg/dL
Microalb Creat Ratio: 0.6 mg/g (ref 0.0–30.0)
Microalb, Ur: 0.8 mg/dL (ref 0.0–1.9)

## 2021-02-28 LAB — HEMOGLOBIN A1C: Hgb A1c MFr Bld: 5.5 % (ref 4.6–6.5)

## 2021-02-28 LAB — POCT INR: INR: 3 (ref 2.0–3.0)

## 2021-02-28 LAB — TSH: TSH: 1.46 u[IU]/mL (ref 0.35–4.50)

## 2021-02-28 LAB — VITAMIN B12: Vitamin B-12: 626 pg/mL (ref 211–911)

## 2021-02-28 MED ORDER — ENTRESTO 49-51 MG PO TABS: 0.5000 | ORAL_TABLET | Freq: Two times a day (BID) | ORAL | 3 refills | Status: DC

## 2021-02-28 NOTE — Progress Notes (Signed)
Medical screening examination/treatment/procedure(s) were performed by non-physician practitioner and as supervising physician I was immediately available for consultation/collaboration. I agree with above. Lee Kuang, MD   

## 2021-02-28 NOTE — Patient Instructions (Addendum)
You had the pneumovax pneumonia shot today  You will be contacted regarding the referral for: eye doctor  Please continue all other medications as before, and refills have been done if requested.  Please have the pharmacy call with any other refills you may need.  Please continue your efforts at being more active, low cholesterol diet, and weight control.  You are otherwise up to date with prevention measures today.  Please keep your appointments with your specialists as you may have planned  Please go to the LAB at the blood drawing area for the tests to be done  You will be contacted by phone if any changes need to be made immediately.  Otherwise, you will receive a letter about your results with an explanation, but please check with MyChart first.  Please remember to sign up for MyChart if you have not done so, as this will be important to you in the future with finding out test results, communicating by private email, and scheduling acute appointments online when needed.  Please make an Appointment to return in 6 months, or sooner if needed

## 2021-02-28 NOTE — Telephone Encounter (Signed)
Pt aware and said he would like to decrease medication.

## 2021-02-28 NOTE — Progress Notes (Signed)
Patient ID: [name], male   DOB: 05-Jul-1956, 65 y.o.   MRN: [id number]         Chief Complaint:: wellness exam and Office Visit (Surgical clearance and discuss "light weight" wheelchair)  , preop exam, hld, dm, chronic left medial leg venous ulcer       HPI:  [name] is a 65 y.o. male here for wellness exam; due for pneumovax and eye referral, ow up to date with preventive referral and immunizations                        Also due for left knee TKR but may not be cleared per cardiology per pt.  Tolerating repatha well, trying to follow lower chol diet.  Pt denies chest pain, increased sob or doe, wheezing, orthopnea, PND, increased LE swelling, palpitations, dizziness or syncope.   Pt denies polydipsia, polyuria, or new focal neuro s/s.  Has chronic left medial leg venous ulcer, seeing wound clinic, walking with 2 canes.     Wt Readings from Last 3 Encounters:  02/28/21 249 lb (112.9 kg)  01/31/21 252 lb 12.8 oz (114.7 kg)  01/17/21 263 lb (119.3 kg)   BP Readings from Last 3 Encounters:  02/28/21 118/70  01/31/21 122/88  01/17/21 130/88   Immunization History  Administered Date(s) Administered  . Influenza Inj Mdck Quad With Preservative 07/22/2019  . Influenza Split 07/09/2011, 07/23/2012  . Influenza Whole 09/16/2006, 08/26/2007, 07/27/2008, 07/26/2009, 07/07/2010  . Influenza, High Dose Seasonal PF 06/22/2020  . Influenza,inj,Quad PF,6+ Mos 06/30/2013, 08/19/2014, 06/27/2015, 07/13/2016, 07/09/2017, 07/18/2018  . PFIZER(Purple Top)SARS-COV-2 Vaccination 03/21/2020, 04/11/2020, 11/03/2020  . Pneumococcal Conjugate-13 04/29/2017  . Pneumococcal Polysaccharide-23 03/29/2010, 07/07/2010, 02/23/2015  . Td 10/16/2003  . Tdap 02/23/2015   Health Maintenance Due  Topic Date Due  . OPHTHALMOLOGY EXAM  02/04/2021      Past Medical History:  Diagnosis Date  . AICD (automatic cardioverter/defibrillator) present   . Anemia    supposed to be taking Vit B but doesn't  .  ANXIETY    takes Xanax nightly  . Arthritis   . Asthma    Albuterol prn and Advair daily;also takes Prednisone daily  . Atrial fibrillation ([place]) 09/06/2015  . Cardiomyopathy [facility])    a. EF 25% TEE July 2013; b. EF normalized 2015;  c. 03/2015 Echo: EF 40-45%, difrf HK, PASP 38 mmHg, Mild MR, sev LAE/RAE.  [name] CHF (congestive heart failure) ([place])   . Chronic constipation    takes OTC stool softener  . COPD (chronic obstructive pulmonary disease) ([place])    "one dr says COPD; one dr says emphysema" (09/18/2017)  . DEPRESSION    takes Zoloft and Doxepin daily  . Diverticulitis   . DYSKINESIA, ESOPHAGUS   . Emphysema of lung [facility])    "one dr says COPD; one dr says emphysema" (09/18/2017)  . Essential hypertension       . FIBROMYALGIA   . GERD (gastroesophageal reflux disease)       . Glaucoma   . HYPERLIPIDEMIA    a. Intolerant to statins.  . INSOMNIA    takes Ambien nightly  . Myocardial infarction [facility])    a. 2012 Myoview notable for prior infarct;  b. 03/2015 Lexiscan CL: EF 37%, diff HK, small area of inferior infarct from apex to base-->Med Rx.  . Myocardial infarction ([place])   . O2 dependent    "2.5L q hs & prn" (09/18/2017)  . Paroxysmal atrial fibrillation ([facility])  a. CHA2DS2VASc = 3--> takes Coumadin;  b. 03/15/2015 Successful TEE/DCCV;  c. 03/2015 recurrent afib, Amio d/c'd in setting of hyperthyroidism.  . Peripheral neuropathy   . Pneumonia 12/2016  . Rash and other nonspecific skin eruption 04/12/2009   no cause found saw dermatologists x 2 and allergist  . SLEEP APNEA, OBSTRUCTIVE    a. doesn't use CPAP  . Syncope    a. 03/2015 s/p MDT LINQ.  [name] Type II diabetes mellitus ([place])        Past Surgical History:  Procedure Laterality Date  . ACNE CYST REMOVAL     2 on back   . AV NODE ABLATION N/A 10/25/2017   Procedure: AV NODE ABLATION;  Surgeon: Deboraha Sprang, MD;  Location: [place] CV LAB;  Service: Cardiovascular;  Laterality: N/A;  . BIV ICD INSERTION CRT-D N/A  09/18/2017   Procedure: BIV ICD INSERTION CRT-D;  Surgeon: Deboraha Sprang, MD;  Location: [place] CV LAB;  Service: Cardiovascular;  Laterality: N/A;  . CARDIAC CATHETERIZATION N/A 03/21/2016   Procedure: Right/Left Heart Cath and Coronary Angiography;  Surgeon: Larey Dresser, MD;  Location: [place] CV LAB;  Service: Cardiovascular;  Laterality: N/A;  . CARDIOVERSION  04/18/2012   Procedure: CARDIOVERSION;  Surgeon: Fay Records, MD;  Location: [place];  Service: Cardiovascular;  Laterality: N/A;  . CARDIOVERSION  04/25/2012   Procedure: CARDIOVERSION;  Surgeon: Thayer Headings, MD;  Location: [place];  Service: Cardiovascular;  Laterality: N/A;  . CARDIOVERSION  04/25/2012   Procedure: CARDIOVERSION;  Surgeon: Fay Records, MD;  Location: [place];  Service: Cardiovascular;  Laterality: N/A;  . CARDIOVERSION  05/09/2012   Procedure: CARDIOVERSION;  Surgeon: Sherren Mocha, MD;  Location: [place];  Service: Cardiovascular;  Laterality: N/A;  changed from crenshaw to cooper by trish/leone-endo  . CARDIOVERSION N/A 03/15/2015   Procedure: CARDIOVERSION;  Surgeon: Thayer Headings, MD;  Location: [facility] ENDOSCOPY;  Service: Cardiovascular;  Laterality: N/A;  . COLONOSCOPY    . COLONOSCOPY WITH PROPOFOL N/A 10/21/2014   Procedure: COLONOSCOPY WITH PROPOFOL;  Surgeon: Ladene Artist, MD;  Location: [facility] ENDOSCOPY;  Service: Endoscopy;  Laterality: N/A;  . EP IMPLANTABLE DEVICE N/A 04/06/2015   Procedure: Loop Recorder Insertion;  Surgeon: Evans Lance, MD;  Location: [place] CV LAB;  Service: Cardiovascular;  Laterality: N/A;  . ESOPHAGOGASTRODUODENOSCOPY    . JOINT REPLACEMENT    . LOOP RECORDER REMOVAL N/A 09/18/2017   Procedure: LOOP RECORDER REMOVAL;  Surgeon: Deboraha Sprang, MD;  Location: [place] CV LAB;  Service: Cardiovascular;  Laterality: N/A;  . RIGHT/LEFT HEART CATH AND CORONARY ANGIOGRAPHY N/A 01/28/2017   Procedure: Right/Left Heart Cath and Coronary Angiography;  Surgeon: Larey Dresser, MD;  Location: [place] CV LAB;  Service: Cardiovascular;  Laterality: N/A;  . TEE WITHOUT CARDIOVERSION  04/25/2012   Procedure: TRANSESOPHAGEAL ECHOCARDIOGRAM (TEE);  Surgeon: Thayer Headings, MD;  Location: [place];  Service: Cardiovascular;  Laterality: N/A;  . TEE WITHOUT CARDIOVERSION N/A 03/15/2015   Procedure: TRANSESOPHAGEAL ECHOCARDIOGRAM (TEE);  Surgeon: Thayer Headings, MD;  Location: [place];  Service: Cardiovascular;  Laterality: N/A;  . TONSILLECTOMY AND ADENOIDECTOMY    . TOTAL KNEE ARTHROPLASTY Right 06/15/2014   Procedure: TOTAL KNEE ARTHROPLASTY;  Surgeon: Renette Butters, MD;  Location: [place];  Service: Orthopedics;  Laterality: Right;    reports that he quit smoking about 13 years ago. His smoking use included cigarettes. He has a 60.00 pack-year smoking  history. He has never used smokeless tobacco. He reports that he does not drink alcohol and does not use drugs. family history includes Allergies in his mother; Asthma in his maternal grandmother and mother; COPD in his mother; Colon polyps in his mother; Hypothyroidism in his mother. Allergies  Allergen Reactions  . Amiodarone Other (See Comments)    hyperthyroidism  . Statins Other (See Comments)    myalgia  . Tape Other (See Comments)    Skin Tears Use Paper Tape Only   Current Outpatient Medications on File Prior to Visit  Medication Sig Dispense Refill  . acetaminophen (TYLENOL) 500 MG tablet Take 500 mg by mouth every 6 (six) hours as needed.    [name] albuterol (VENTOLIN HFA) 108 (90 Base) MCG/ACT inhaler INHALE 2 PUFFS 4 TIMES A DAY as needed 54 g 3  . ALPRAZolam (XANAX) 0.5 MG tablet TAKE 2 TABLETS BY MOUTH AT BEDTIME 60 tablet 2  . AMBULATORY NON FORMULARY MEDICATION Rolling walker with a bench 1 Units 0  . augmented betamethasone dipropionate (DIPROLENE-AF) 0.05 % cream Apply topically.    . carisoprodol (SOMA) 350 MG tablet TAKE 1 TABLET BY MOUTH THREE TIMES A DAY AS NEEDED FOR MUSCLE SPASMS 90  tablet 5  . carvedilol (COREG) 12.5 MG tablet Take 1 tablet (12.5 mg total) by mouth 2 (two) times daily with a meal. 180 tablet 3  . dapagliflozin propanediol (FARXIGA) 10 MG TABS tablet Take 1 tablet (10 mg total) by mouth daily before breakfast. 30 tablet 11  . digoxin (LANOXIN) 0.25 MG tablet TAKE 1/2 TABLET BY MOUTH EVERY DAY 45 tablet 3  . doxepin (SINEQUAN) 10 MG capsule Take 2 capsules (20 mg total) by mouth at bedtime as needed. 180 capsule 1  . Dupilumab (DUPIXENT) 300 MG/2ML SOPN Inject 1 Dose into the skin every 14 (fourteen) days.    . Evolocumab (REPATHA SURECLICK) [id number] MG/ML SOAJ Inject 1 pen into the skin every 14 (fourteen) days. 6 mL 3  . fluticasone (FLONASE) 50 MCG/ACT nasal spray PLACE 2 SPRAYS INTO BOTH NOSTRILS DAILY AS NEEDED FOR ALLERGIES OR RHINITIS. 48 mL 1  . hydrOXYzine (ATARAX/VISTARIL) 10 MG tablet TAKE 1 TABLET (10 MG TOTAL) BY MOUTH 3 (THREE) TIMES DAILY AS NEEDED FOR ITCHING. 60 tablet 2  . lidocaine (LIDODERM) 5 % APPLY 1 PATCH TO AFFECTED AREA FOR UP TO 12 HOURS . DO NOT USE MORE THAN ONE PATCH IN 24 HOURS. 90 patch 2  . omeprazole (PRILOSEC) 20 MG capsule TAKE 1 CAPSULE BY MOUTH TWICE A DAY 180 capsule 1  . potassium chloride SA (KLOR-CON) 20 MEQ tablet Take 1 tablet (20 mEq total) by mouth daily. 90 tablet 3  . sacubitril-valsartan (ENTRESTO) 49-51 MG Take 0.5 tablets by mouth 2 (two) times daily. 180 tablet 3  . spironolactone (ALDACTONE) 25 MG tablet TAKE 1 TABLET BY MOUTH EVERY DAY 90 tablet 3  . tamsulosin (FLOMAX) 0.4 MG CAPS capsule     . torsemide (DEMADEX) 20 MG tablet Take 4 tablets (80 mg total) by mouth in the morning AND 3 tablets (60 mg total) every evening. 240 tablet 3  . traMADol (ULTRAM) 50 MG tablet TAKE 1 TABLET (50 MG TOTAL) BY MOUTH DAILY AS NEEDED. 30 tablet 5  . traZODone (DESYREL) 100 MG tablet TAKE 1 TABLET BY MOUTH EVERY DAY AT BEDTIME AS NEEDED 90 tablet 1  . TUMS 500 MG chewable tablet Chew 500-2,000 mg by mouth every 4 (four) hours  as needed for indigestion or heartburn.     [name]  valACYclovir (VALTREX) 1000 MG tablet TAKE 1 TABLET BY MOUTH THREE TIMES A DAY 21 tablet 2  . warfarin (COUMADIN) 10 MG tablet Take 10 mg by mouth daily. Mon,wed,fri,sat    . metolazone (ZAROXOLYN) 2.5 MG tablet Take 1 tablet (2.5 mg total) by mouth as directed. (Patient not taking: Reported on 01/31/2021) 5 tablet 0   No current facility-administered medications on file prior to visit.        ROS:  All others reviewed and negative.  Objective        PE:  BP 118/70 (BP Location: Right Arm, Patient Position: Sitting, Cuff Size: Large)   Pulse 74   Temp 97.9 F (36.6 C) (Oral)   Ht 5\' 10"  (1.778 m)   Wt 249 lb (112.9 kg)   SpO2 97%   BMI 35.73 kg/m                 Constitutional: Pt appears in NAD               HENT: Head: NCAT.                Right Ear: External ear normal.                 Left Ear: External ear normal.                Eyes: . Pupils are equal, round, and reactive to light. Conjunctivae and EOM are normal               Nose: without d/c or deformity               Neck: Neck supple. Gross normal ROM               Cardiovascular: Normal rate and regular rhythm.                 Pulmonary/Chest: Effort normal and breath sounds without rales or wheezing.                Abd:  Soft, NT, ND, + BS, no organomegaly               Neurological: Pt is alert. At baseline orientation, motor grossly intact               Skin: Skin is warm. No rashes, no other new lesions, LE edema - trace bilateral               Psychiatric: Pt behavior is normal without agitation   Micro: none  Cardiac tracings I have personally interpreted today:  ECG - vetricular paced rhythm  Pertinent Radiological findings (summarize): none   Lab Results  Component Value Date   WBC 9.0 02/28/2021   HGB 14.0 02/28/2021   HCT 40.7 02/28/2021   PLT 210.0 02/28/2021   GLUCOSE 85 02/28/2021   CHOL 153 02/28/2021   TRIG 335.0 (H) 02/28/2021   HDL 33.60 (L)  02/28/2021   LDLDIRECT 72.0 02/28/2021   LDLCALC 162 (H) 10/20/2020   ALT 24 02/28/2021   AST 21 02/28/2021   NA 138 02/28/2021   K 4.2 02/28/2021   CL 103 02/28/2021   CREATININE 1.29 02/28/2021   BUN 30 (H) 02/28/2021   CO2 25 02/28/2021   TSH 1.46 02/28/2021   PSA 2.26 02/28/2021   INR 3.0 02/28/2021   HGBA1C 5.5 02/28/2021   MICROALBUR 0.8 02/28/2021   Assessment/Plan:  [name] is a 65 y.o. White or Caucasian [  1] male with  has a past medical history of AICD (automatic cardioverter/defibrillator) present, Anemia, ANXIETY, Arthritis, Asthma, Atrial fibrillation ([place]) (09/06/2015), Cardiomyopathy ([place]), CHF (congestive heart failure) ([place]), Chronic constipation, COPD (chronic obstructive pulmonary disease) ([place]), DEPRESSION, Diverticulitis, DYSKINESIA, ESOPHAGUS, Emphysema of lung ([place]), Essential hypertension, FIBROMYALGIA, GERD (gastroesophageal reflux disease), Glaucoma, HYPERLIPIDEMIA, INSOMNIA, Myocardial infarction [facility]), Myocardial infarction ([place]), O2 dependent, Paroxysmal atrial fibrillation ([place]), Peripheral neuropathy, Pneumonia (12/2016), Rash and other nonspecific skin eruption (04/12/2009), SLEEP APNEA, OBSTRUCTIVE, Syncope, and Type II diabetes mellitus ([place]).  Encounter for well adult exam with abnormal findings Age and sex appropriate education and counseling updated with regular exercise and diet Referrals for preventative services - for eye exam Immunizations addressed - for pneumovax Smoking counseling  - none needed Evidence for depression or other mood disorder - none significant Most recent labs reviewed. I have personally reviewed and have noted: 1) the patient's medical and social history 2) The patient's current medications and supplements 3) The patient's height, weight, and BMI have been recorded in the chart   Vitamin D deficiency Last vitamin D Lab Results  Component Value Date   VD25OH 24.78 (L) 02/28/2021   Low, to start oral  replacement   Type II diabetes mellitus ([place]) Lab Results  Component Value Date   HGBA1C 5.5 02/28/2021   Stable, pt to continue current medical treatment - farxiga   Laceration of left leg Stable, cont wound clinic,  to f/u any worsening symptoms or concerns  Increased prostate specific antigen (PSA) velocity For f/u psa,  to f/u any worsening symptoms or concerns  Hyperlipidemia Lab Results  Component Value Date   LDLCALC 162 (H) 10/20/2020   Stable, pt to continue current repatha, low chol diet   Essential hypertension BP Readings from Last 3 Encounters:  02/28/21 118/70  01/31/21 122/88  01/17/21 130/88   Stable, pt to continue medical treatment coreg, entresto   Preop exam for internal medicine Ok for surgury from IM  B12 deficiency Lab Results  Component Value Date   VITAMINB12 626 02/28/2021   Stable, cont oral replacement - b12 1000 mcg qd   Followup: Return in about 6 months (around 08/31/2021).  [name], MD 03/04/2021 3:21 PM [facility]

## 2021-02-28 NOTE — Patient Instructions (Signed)
Pre visit review using our clinic review tool, if applicable. No additional management support is needed unless otherwise documented below in the visit note. Skip dosage today (5/17) and then continue to take 1/2 tablet daily except take 1 tablet on Mondays and Friday. Re-check INR in 6 weeks.  Patient advised to stop taking ibuprofen.

## 2021-03-01 ENCOUNTER — Encounter: Payer: Self-pay | Admitting: Internal Medicine

## 2021-03-01 DIAGNOSIS — E559 Vitamin D deficiency, unspecified: Secondary | ICD-10-CM | POA: Insufficient documentation

## 2021-03-01 LAB — LDL CHOLESTEROL, DIRECT: Direct LDL: 72 mg/dL

## 2021-03-01 NOTE — Progress Notes (Signed)
[name], [name] ([id number]) Visit Report for 02/27/2021 Chief Complaint Document Details Patient Name: Date of Service: [name] RD L. 02/27/2021 10:00 A M Medical Record Number: [id number] Patient Account Number: [id number] Date of Birth/Sex: Treating RN: 1955/12/04 (65 y.o. [name] Primary Care Provider: [name] Other Clinician: Referring Provider: Treating Provider/Extender: [name] in Treatment: 2 Information Obtained from: Patient Chief Complaint Left lower extremity wound Electronic Signature(s) Signed: 02/27/2021 11:03:24 AM By: [name] Entered By: Kalman Shan on 02/27/2021 10:56:21 -------------------------------------------------------------------------------- Debridement Details Patient Name: Date of Service: [name], [name] RD L. 02/27/2021 10:00 A M Medical Record Number: [id number] Patient Account Number: [id number] Date of Birth/Sex: Treating RN: May 06, 1956 (65 y.o. [name] Primary Care Provider: [name] Other Clinician: Referring Provider: Treating Provider/Extender: [name] in Treatment: 2 Debridement Performed for Assessment: Wound #1 Left,Medial Lower Leg Performed By: Physician Kalman Shan, DO Debridement Type: Debridement Severity of Tissue Pre Debridement: Fat layer exposed Level of Consciousness (Pre-procedure): Awake and Alert Pre-procedure Verification/Time Out Yes - 10:50 Taken: Start Time: 10:50 Pain Control: Other : benzocaine 20% spray T Area Debrided (L x W): otal 2.2 (cm) x 0.7 (cm) = 1.54 (cm) Tissue and other material debrided: Viable, Non-Viable, Slough, Subcutaneous, Slough Level: Skin/Subcutaneous Tissue Debridement Description: Excisional Instrument: Curette Bleeding: Minimum Hemostasis Achieved: Pressure End Time: 10:53 Procedural Pain: 0 Post Procedural Pain: 0 Response to Treatment: Procedure was tolerated well Level of  Consciousness (Post- Awake and Alert procedure): Post Debridement Measurements of Total Wound Length: (cm) 2.2 Width: (cm) 0.7 Depth: (cm) 0.1 Volume: (cm) 0.121 Character of Wound/Ulcer Post Debridement: Improved Severity of Tissue Post Debridement: Fat layer exposed Post Procedure Diagnosis Same as Pre-procedure Electronic Signature(s) Signed: 02/27/2021 11:03:24 AM By: [name] Signed: 02/27/2021 4:50:33 PM By: Baruch Gouty RN, BSN Entered By: Baruch Gouty on 02/27/2021 10:52:52 -------------------------------------------------------------------------------- HPI Details Patient Name: Date of Service: [name] RD L. 02/27/2021 10:00 A M Medical Record Number: [id number] Patient Account Number: [id number] Date of Birth/Sex: Treating RN: 09-07-1956 (65 y.o. [name] Primary Care Provider: [name] Other Clinician: Referring Provider: Treating Provider/Extender: [name] in Treatment: 2 History of Present Illness HPI Description: Admission 5/2 [name] is a 65 year old male with a past medical history of systolic congestive heart failure, COPD, nonischemic cardiomyopathy, type 2 diabetes, chronic venous insufficiency that presents to the clinic for a 59-month history of nonhealing wound to his left lower extremity. He states that in November 2021 he fell off his porch and Hit his leg against a brick. The wound has healed mostly except for 1 area that is still open. He reports moderate Serosanguineous drainage daily. He has been keeping it covered with Band-Aids. He reports 2 rounds of antibiotics for this issue. He denies pain. He denies current acute signs of infection. 5/9; patient presents for 1 week follow-up. He has been using Hydrofera Blue under 3 layer compression. He has no issues or complaints today. He denies signs of infection. 5/16; patient presents for 1 week follow-up. He has been using Hydrofera Blue under 3 layer  compression. He has no issues or complaints today. He denies signs of infection. He states he overall feels well Electronic Signature(s) Signed: 02/27/2021 11:03:24 AM By: [name] Entered By: Kalman Shan on 02/27/2021 10:56:56 -------------------------------------------------------------------------------- Physical Exam Details Patient Name: Date of Service: [name] RD L. 02/27/2021 10:00 [place] Record Number: [id number] Patient Account Number:  [id number] Date of Birth/Sex: Treating RN: 1955/11/10 (65 y.o. [name] Primary Care Provider: [name] Other Clinician: Referring Provider: Treating Provider/Extender: [name] in Treatment: 2 Constitutional respirations regular, non-labored and within target range for patient.. Cardiovascular 2+ dorsalis pedis/posterior tibialis pulses. Psychiatric pleasant and cooperative. Notes Left lower extremity: The medial aspect has an open wound with granulation tissue present. The surface is a bit gritty With some nonviable tissue present. Overall this appears healing. No signs of infection. Excellent edema control Electronic Signature(s) Signed: 02/27/2021 11:03:24 AM By: [name] Entered By: Kalman Shan on 02/27/2021 10:58:00 -------------------------------------------------------------------------------- Physician Orders Details Patient Name: Date of Service: [name] RD L. 02/27/2021 10:00 A M Medical Record Number: [id number] Patient Account Number: [id number] Date of Birth/Sex: Treating RN: 1956-08-06 (65 y.o. [name] Primary Care Provider: [name] Other Clinician: Referring Provider: Treating Provider/Extender: [name] in Treatment: 2 Verbal / Phone Orders: No Diagnosis Coding ICD-10 Coding Code Description [phone] Non-pressure chronic ulcer of other part of left lower leg with unspecified severity I87.2 Venous  insufficiency (chronic) (peripheral) I10 Essential (primary) hypertension I42.8 Other cardiomyopathies J44.9 Chronic obstructive pulmonary disease, unspecified I48.19 Other persistent atrial fibrillation E11.9 Type 2 diabetes mellitus without complications [id number] Chronic systolic (congestive) heart failure Follow-up Appointments Return Appointment in 1 week. Bathing/ Shower/ Hygiene May shower with protection but do not get wound dressing(s) wet. - use cast protector to keep wrap dry in the shower Edema Control - Lymphedema / SCD / Other Left Lower Extremity Elevate legs to the level of the heart or above for 30 minutes daily and/or when sitting, a frequency of: - throughout the day Avoid standing for long periods of time. Exercise regularly Compression stocking or Garment 20-30 mm/Hg pressure to: - compression stocking to right leg daily Wound Treatment Wound #1 - Lower Leg Wound Laterality: Left, Medial Peri-Wound Care: Sween Lotion (Moisturizing lotion) 1 x Per Week Discharge Instructions: Apply moisturizing lotion as directed Prim Dressing: Hydrofera Blue Classic Foam, 2x2 in 1 x Per Week ary Discharge Instructions: Moisten with saline prior to applying to wound bed Secondary Dressing: Woven Gauze Sponge, Non-Sterile 4x4 in 1 x Per Week Discharge Instructions: Apply over primary dressing as directed. Compression Wrap: ThreePress (3 layer compression wrap) 1 x Per Week Discharge Instructions: Apply three layer compression as directed. Electronic Signature(s) Signed: 02/27/2021 11:03:24 AM By: [name] Entered By: Kalman Shan on 02/27/2021 10:58:19 -------------------------------------------------------------------------------- Problem List Details Patient Name: Date of Service: [name], [name] RD L. 02/27/2021 10:00 A M Medical Record Number: [id number] Patient Account Number: [id number] Date of Birth/Sex: Treating RN: [birth year] (65 y.o. [name] Primary  Care Provider: [name] Other Clinician: Referring Provider: Treating Provider/Extender: [name] in Treatment: 2 Active Problems ICD-10 Encounter Code Description Active Date MDM Diagnosis L97.829 Non-pressure chronic ulcer of other part of left lower leg with unspecified 02/13/2021 No Yes severity I87.2 Venous insufficiency (chronic) (peripheral) 02/13/2021 No Yes I10 Essential (primary) hypertension 02/13/2021 No Yes I42.8 Other cardiomyopathies 02/13/2021 No Yes J44.9 Chronic obstructive pulmonary disease, unspecified 02/13/2021 No Yes I48.19 Other persistent atrial fibrillation 02/13/2021 No Yes E11.9 Type 2 diabetes mellitus without complications [id number] No Yes [id number] Chronic systolic (congestive) heart failure 02/13/2021 No Yes Inactive Problems Resolved Problems Electronic Signature(s) Signed: 02/27/2021 11:03:24 AM By: [name] Entered By: Kalman Shan on 02/27/2021 10:56:05 -------------------------------------------------------------------------------- Progress Note Details Patient Name: Date of Service: [name], [name] RD L. 02/27/2021 10:00  A M Medical Record Number: [id number] Patient Account Number: [id number] Date of Birth/Sex: Treating RN: [birth year] (65 y.o. [name] Primary Care Provider: Other Clinician: [name] Referring Provider: Treating Provider/Extender: [name] in Treatment: 2 Subjective Chief Complaint Information obtained from Patient Left lower extremity wound History of Present Illness (HPI) Admission 5/2 [name] is a 65 year old male with a past medical history of systolic congestive heart failure, COPD, nonischemic cardiomyopathy, type 2 diabetes, chronic venous insufficiency that presents to the clinic for a 59-month history of nonhealing wound to his left lower extremity. He states that in November 2021 he fell off his porch and Hit his leg against a brick. The wound has  healed mostly except for 1 area that is still open. He reports moderate Serosanguineous drainage daily. He has been keeping it covered with Band-Aids. He reports 2 rounds of antibiotics for this issue. He denies pain. He denies current acute signs of infection. 5/9; patient presents for 1 week follow-up. He has been using Hydrofera Blue under 3 layer compression. He has no issues or complaints today. He denies signs of infection. 5/16; patient presents for 1 week follow-up. He has been using Hydrofera Blue under 3 layer compression. He has no issues or complaints today. He denies signs of infection. He states he overall feels well Patient History Information obtained from Patient. Family History Heart Disease - Father, Hypertension - Mother,Father,Siblings, Lung Disease - Mother, Thyroid Problems - Mother, No family history of Cancer, Diabetes, Hereditary Spherocytosis, Kidney Disease, Seizures, Stroke, Tuberculosis. Social History Former smoker - quit 2009, Marital Status - Divorced, Alcohol Use - Never, Drug Use - No History, Caffeine Use - Daily - coffee, diet soda. Medical History Eyes Patient has history of Cataracts Denies history of Glaucoma, Optic Neuritis Hematologic/Lymphatic Patient has history of Anemia Respiratory Patient has history of Chronic Obstructive Pulmonary Disease (COPD), Sleep Apnea - sleeps in recliner Denies history of Asthma, Pneumothorax Cardiovascular Patient has history of Arrhythmia - afib, Congestive Heart Failure, Deep Vein Thrombosis, Myocardial Infarction, Peripheral Venous Disease Endocrine Patient has history of Type II Diabetes Denies history of Type I Diabetes Genitourinary Denies history of End Stage Renal Disease Integumentary (Skin) Denies history of History of Burn Musculoskeletal Patient has history of Osteoarthritis Oncologic Denies history of Received Chemotherapy, Received Radiation Psychiatric Denies history of Anorexia/bulimia,  Confinement Anxiety Medical A Surgical History Notes nd Constitutional Symptoms (General Health) obesity Cardiovascular cardiomyopathy, pacemaker/defibulator Gastrointestinal GERD Endocrine thyrotoxicosis Genitourinary hx kidney stones, BPH Musculoskeletal thoracic and lumbosacral spondylosis Objective Constitutional respirations regular, non-labored and within target range for patient.. Vitals Time Taken: 10:15 AM, Height: 70 in, Weight: 260 lbs, BMI: 37.3, Temperature: 97.5 F, Pulse: 76 bpm, Respiratory Rate: 18 breaths/min, Blood Pressure: 90/63 mmHg. Cardiovascular 2+ dorsalis pedis/posterior tibialis pulses. Psychiatric pleasant and cooperative. General Notes: Left lower extremity: The medial aspect has an open wound with granulation tissue present. The surface is a bit gritty With some nonviable tissue present. Overall this appears healing. No signs of infection. Excellent edema control Integumentary (Hair, Skin) Wound #1 status is Open. Original cause of wound was Trauma. The date acquired was: 09/06/2020. The wound has been in treatment 2 weeks. The wound is located on the Left,Medial Lower Leg. The wound measures 2.2cm length x 0.7cm width x 0.1cm depth; 1.21cm^2 area and 0.121cm^3 volume. Assessment Active Problems ICD-10 Non-pressure chronic ulcer of other part of left lower leg with unspecified severity Venous insufficiency (chronic) (peripheral) Essential (primary) hypertension Other cardiomyopathies Chronic obstructive pulmonary  disease, unspecified Other persistent atrial fibrillation Type 2 diabetes mellitus without complications Chronic systolic (congestive) heart failure Patient's wound shows improvement in appearance and size. There was some devitalized tissue that was debrided in office. No signs of infection. There is granulation tissue present with some fibrotic tissue although improved from last clinic visit. We will continue with Hydrofera Blue under  3 layer compression as this has worked well for the patient. Procedures Wound #1 Pre-procedure diagnosis of Wound #1 is a Vasculopathy located on the Left,Medial Lower Leg .Severity of Tissue Pre Debridement is: Fat layer exposed. There was a Excisional Skin/Subcutaneous Tissue Debridement with a total area of 1.54 sq cm performed by Kalman Shan, DO. With the following instrument(s): Curette to remove Viable and Non-Viable tissue/material. Material removed includes Subcutaneous Tissue and Slough and after achieving pain control using Other (benzocaine 20% spray). No specimens were taken. A time out was conducted at 10:50, prior to the start of the procedure. A Minimum amount of bleeding was controlled with Pressure. The procedure was tolerated well with a pain level of 0 throughout and a pain level of 0 following the procedure. Post Debridement Measurements: 2.2cm length x 0.7cm width x 0.1cm depth; 0.121cm^3 volume. Character of Wound/Ulcer Post Debridement is improved. Severity of Tissue Post Debridement is: Fat layer exposed. Post procedure Diagnosis Wound #1: Same as Pre-Procedure Pre-procedure diagnosis of Wound #1 is a Vasculopathy located on the Left,Medial Lower Leg . There was a Three Layer Compression Therapy Procedure by [name], RN. Post procedure Diagnosis Wound #1: Same as Pre-Procedure Plan Follow-up Appointments: Return Appointment in 1 week. Bathing/ Shower/ Hygiene: May shower with protection but do not get wound dressing(s) wet. - use cast protector to keep wrap dry in the shower Edema Control - Lymphedema / SCD / Other: Elevate legs to the level of the heart or above for 30 minutes daily and/or when sitting, a frequency of: - throughout the day Avoid standing for long periods of time. Exercise regularly Compression stocking or Garment 20-30 mm/Hg pressure to: - compression stocking to right leg daily WOUND #1: - Lower Leg Wound Laterality: Left,  Medial Peri-Wound Care: Sween Lotion (Moisturizing lotion) 1 x Per Week/ Discharge Instructions: Apply moisturizing lotion as directed Prim Dressing: Hydrofera Blue Classic Foam, 2x2 in 1 x Per Week/ ary Discharge Instructions: Moisten with saline prior to applying to wound bed Secondary Dressing: Woven Gauze Sponge, Non-Sterile 4x4 in 1 x Per Week/ Discharge Instructions: Apply over primary dressing as directed. Com pression Wrap: ThreePress (3 layer compression wrap) 1 x Per Week/ Discharge Instructions: Apply three layer compression as directed. 1. Hydrofera Blue under 3 layer compression 2. In office sharp debridement dialysis line 3. Follow-up in 1 week Electronic Signature(s) Signed: 02/27/2021 11:03:24 AM By: [name] Entered By: Kalman Shan on 02/27/2021 10:59:41 -------------------------------------------------------------------------------- HxROS Details Patient Name: Date of Service: [name], [name] RD L. 02/27/2021 10:00 A M Medical Record Number: [id number] Patient Account Number: [id number] Date of Birth/Sex: Treating RN: Jun 10, 1956 (65 y.o. [name] Primary Care Provider: [name] Other Clinician: Referring Provider: Treating Provider/Extender: [name] in Treatment: 2 Information Obtained From Patient Constitutional Symptoms (General Health) Medical History: Past Medical History Notes: obesity Eyes Medical History: Positive for: Cataracts Negative for: Glaucoma; Optic Neuritis Hematologic/Lymphatic Medical History: Positive for: Anemia Respiratory Medical History: Positive for: Chronic Obstructive Pulmonary Disease (COPD); Sleep Apnea - sleeps in recliner Negative for: Asthma; Pneumothorax Cardiovascular Medical History: Positive for: Arrhythmia - afib; Congestive Heart  Failure; Deep Vein Thrombosis; Myocardial Infarction; Peripheral Venous Disease Past Medical History Notes: cardiomyopathy,  pacemaker/defibulator Gastrointestinal Medical History: Past Medical History Notes: GERD Endocrine Medical History: Positive for: Type II Diabetes Negative for: Type I Diabetes Past Medical History Notes: thyrotoxicosis Time with diabetes: 10 yrs Treated with: Diet Blood sugar tested every day: No Genitourinary Medical History: Negative for: End Stage Renal Disease Past Medical History Notes: hx kidney stones, BPH Integumentary (Skin) Medical History: Negative for: History of Burn Musculoskeletal Medical History: Positive for: Osteoarthritis Past Medical History Notes: thoracic and lumbosacral spondylosis Oncologic Medical History: Negative for: Received Chemotherapy; Received Radiation Psychiatric Medical History: Negative for: Anorexia/bulimia; Confinement Anxiety HBO Extended History Items Eyes: Cataracts Immunizations Pneumococcal Vaccine: Received Pneumococcal Vaccination: Yes Implantable Devices Yes Family and Social History Cancer: No; Diabetes: No; Heart Disease: Yes - Father; Hereditary Spherocytosis: No; Hypertension: Yes - Mother,Father,Siblings; Kidney Disease: No; Lung Disease: Yes - Mother; Seizures: No; Stroke: No; Thyroid Problems: Yes - Mother; Tuberculosis: No; Former smoker - quit 2009; Marital Status - Divorced; Alcohol Use: Never; Drug Use: No History; Caffeine Use: Daily - coffee, diet soda; Financial Concerns: No; Food, Clothing or Shelter Needs: No; Support System Lacking: No; Transportation Concerns: No Electronic Signature(s) Signed: 02/27/2021 11:03:24 AM By: [name] Signed: 03/01/2021 6:30:22 PM By: Levan Hurst RN, BSN Entered By: Kalman Shan on 02/27/2021 10:57:04 -------------------------------------------------------------------------------- SuperBill Details Patient Name: Date of Service: [name] RD L. 02/27/2021 Medical Record Number: [id number] Patient Account Number: [id number] Date of Birth/Sex: Treating  RN: Oct 24, 1955 (65 y.o. [name] Primary Care Provider: [name] Other Clinician: Referring Provider: Treating Provider/Extender: [name] in Treatment: 2 Diagnosis Coding ICD-10 Codes Code Description [phone] Non-pressure chronic ulcer of other part of left lower leg with unspecified severity I87.2 Venous insufficiency (chronic) (peripheral) I10 Essential (primary) hypertension I42.8 Other cardiomyopathies J44.9 Chronic obstructive pulmonary disease, unspecified I48.19 Other persistent atrial fibrillation E11.9 Type 2 diabetes mellitus without complications T55.73 Chronic systolic (congestive) heart failure Facility Procedures CPT4 Code: [id number] Description: [facility] - DEB SUBQ TISSUE 20 SQ CM/< ICD-10 Diagnosis Description L97.829 Non-pressure chronic ulcer of other part of left lower leg with unspecified sev Modifier: erity Quantity: 1 Physician Procedures : CPT4 Code Description Modifier [id number] - WC PHYS SUBQ TISS 20 SQ CM ICD-10 Diagnosis Description L97.829 Non-pressure chronic ulcer of other part of left lower leg with unspecified severity Quantity: 1 Electronic Signature(s) Signed: 02/27/2021 11:03:24 AM By: [name] Entered By: Kalman Shan on 02/27/2021 10:59:49

## 2021-03-01 NOTE — Progress Notes (Signed)
KIMBALL, APPLEBY ([id number]) Visit Report for 02/27/2021 Arrival Information Details Patient Name: Date of Service: [name] RD L. 02/27/2021 10:00 A M Medical Record Number: [id number] Patient Account Number: [id number] Date of Birth/Sex: Treating RN: May 06, 1956 (65 y.o. [name] Primary Care Amica Harron: [name] Other Clinician: Referring Bernie Fobes: Treating Myron Stankovich/Extender: [name] in Treatment: 2 Visit Information History Since Last Visit Added or deleted any medications: No Patient Arrived: [name] Any new allergies or adverse reactions: No Arrival Time: 10:11 Had a fall or experienced change in No Accompanied By: self activities of daily living that may affect Transfer Assistance: None risk of falls: Patient Identification Verified: Yes Signs or symptoms of abuse/neglect since last visito No Secondary Verification Process Completed: Yes Hospitalized since last visit: No Patient Requires Transmission-Based Precautions: No Implantable device outside of the clinic excluding No Patient Has Alerts: Yes cellular tissue based products placed in the center Patient Alerts: R ABI= 1.33, L ABI=1.16 since last visit: Has Dressing in Place as Prescribed: Yes Pain Present Now: No Electronic Signature(s) Signed: 02/27/2021 2:51:01 PM By: Sandre Kitty Entered By: Sandre Kitty on 02/27/2021 10:14:55 -------------------------------------------------------------------------------- Compression Therapy Details Patient Name: Date of Service: [name] RD L. 02/27/2021 10:00 A M Medical Record Number: [id number] Patient Account Number: [id number] Date of Birth/Sex: Treating RN: 23-Feb-1956 (65 y.o. [name] Primary Care Bina Veenstra: [name] Other Clinician: Referring Maanvi Lecompte: Treating Ole Lafon/Extender: [name] in Treatment: 2 Compression Therapy Performed for Wound Assessment: Wound #1 Left,Medial Lower  Leg Performed By: Clinician [name], RN Compression Type: Three Layer Post Procedure Diagnosis Same as Pre-procedure Electronic Signature(s) Signed: 02/27/2021 4:50:33 PM By: Baruch Gouty RN, BSN Entered By: Baruch Gouty on 02/27/2021 10:51:29 -------------------------------------------------------------------------------- Encounter Discharge Information Details Patient Name: Date of Service: [name] RD L. 02/27/2021 10:00 A M Medical Record Number: [id number] Patient Account Number: [id number] Date of Birth/Sex: Treating RN: [birth year] (65 y.o. [name] Primary Care Hosam Mcfetridge: [name] Other Clinician: Referring Verlinda Slotnick: Treating Zenita Kister/Extender: [name] in Treatment: 2 Encounter Discharge Information Items Post Procedure Vitals Discharge Condition: Stable Temperature (F): 97.5 Ambulatory Status: Cane Pulse (bpm): 76 Discharge Destination: Home Respiratory Rate (breaths/min): 18 Transportation: Private Auto Blood Pressure (mmHg): 90/63 Schedule Follow-up Appointment: Yes Clinical Summary of Care: Provided on 02/27/2021 Form Type Recipient Paper Patient Patient Electronic Signature(s) Signed: 02/27/2021 5:59:48 PM By: [name] Previous Signature: 02/27/2021 5:59:26 PM Version By: [name] Entered By: [name] on 02/27/2021 17:59:48 -------------------------------------------------------------------------------- Multi Wound Chart Details Patient Name: Date of Service: [name] RD L. 02/27/2021 10:00 A M Medical Record Number: [id number] Patient Account Number: [id number] Date of Birth/Sex: Treating RN: 19-Feb-1956 (65 y.o. [name] Primary Care Arline Ketter: [name] Other Clinician: Referring Lashawne Dura: Treating Janette Harvie/Extender: [name] in Treatment: 2 Vital Signs Height(in): 70 Pulse(bpm): 58 Weight(lbs): 260 Blood Pressure(mmHg): 90/63 Body Mass  Index(BMI): 37 Temperature(F): 97.5 Respiratory Rate(breaths/min): 18 Photos: [1:No Photos Left, Medial Lower Leg] [N/A:N/A N/A] Wound Location: [1:Trauma] [N/A:N/A] Wounding Event: [1:Vasculopathy] [N/A:N/A] Primary Etiology: [1:Diabetic Wound/Ulcer of the Lower] [N/A:N/A] Secondary Etiology: [1:Extremity 09/06/2020] [N/A:N/A] Date Acquired: [1:2] [N/A:N/A] Weeks of Treatment: [1:Open] [N/A:N/A] Wound Status: [1:2.2x0.7x0.1] [N/A:N/A] Measurements L x W x D (cm) [1:1.21] [N/A:N/A] A (cm) : rea [1:0.121] [N/A:N/A] Volume (cm) : [1:72.00%] [N/A:N/A] % Reduction in Area: [1:72.00%] [N/A:N/A] % Reduction in Volume: [1:Full Thickness Without Exposed] [N/A:N/A] Classification: [1:Support Structures Debridement - Excisional] [N/A:N/A] Debridement: Pre-procedure Verification/Time Out 10:50 [N/A:N/A]  Taken: [1:Other] [N/A:N/A] Pain Control: [1:Subcutaneous, Slough] [N/A:N/A] Tissue Debrided: [1:Skin/Subcutaneous Tissue] [N/A:N/A] Level: [1:1.54] [N/A:N/A] Debridement A (sq cm): [1:rea Curette] [N/A:N/A] Instrument: [1:Minimum] [N/A:N/A] Bleeding: [1:Pressure] [N/A:N/A] Hemostasis A chieved: [1:0] [N/A:N/A] Procedural Pain: [1:0] [N/A:N/A] Post Procedural Pain: [1:Procedure was tolerated well] [N/A:N/A] Debridement Treatment Response: [1:2.2x0.7x0.1] [N/A:N/A] Post Debridement Measurements L x W x D (cm) [1:0.121] [N/A:N/A] Post Debridement Volume: (cm) [1:Compression Therapy] [N/A:N/A] Procedures Performed: [1:Debridement] Treatment Notes Electronic Signature(s) Signed: 02/27/2021 11:03:24 AM By: [name] Signed: 03/01/2021 6:30:22 PM By: Levan Hurst RN, BSN Entered By: Kalman Shan on 02/27/2021 10:56:12 -------------------------------------------------------------------------------- Multi-Disciplinary Care Plan Details Patient Name: Date of Service: [name] RD L. 02/27/2021 10:00 A M Medical Record Number: [id number] Patient Account Number:  [id number] Date of Birth/Sex: Treating RN: 07-25-1956 (65 y.o. [name] Primary Care Brendolyn Stockley: [name] Other Clinician: Referring Beckie Viscardi: Treating Rissie Sculley/Extender: [name] in Treatment: 2 Multidisciplinary Care Plan reviewed with physician Active Inactive Abuse / Safety / Falls / Self Care Management Nursing Diagnoses: History of Falls Potential for falls Goals: Patient/caregiver will verbalize/demonstrate measures taken to prevent injury and/or falls Date Initiated: 02/13/2021 Target Resolution Date: 03/13/2021 Goal Status: Active Interventions: Assess fall risk on admission and as needed Assess impairment of mobility on admission and as needed per policy Notes: Venous Leg Ulcer Nursing Diagnoses: Knowledge deficit related to disease process and management Potential for venous Insuffiency (use before diagnosis confirmed) Goals: Patient will maintain optimal edema control Date Initiated: 02/13/2021 Target Resolution Date: 03/13/2021 Goal Status: Active Interventions: Assess peripheral edema status every visit. Compression as ordered Provide education on venous insufficiency Treatment Activities: Therapeutic compression applied : 02/13/2021 Notes: Wound/Skin Impairment Nursing Diagnoses: Impaired tissue integrity Knowledge deficit related to ulceration/compromised skin integrity Goals: Patient/caregiver will verbalize understanding of skin care regimen Date Initiated: 02/13/2021 Target Resolution Date: 03/13/2021 Goal Status: Active Ulcer/skin breakdown will have a volume reduction of 30% by week 4 Date Initiated: 02/13/2021 Target Resolution Date: 03/13/2021 Goal Status: Active Interventions: Assess patient/caregiver ability to obtain necessary supplies Assess patient/caregiver ability to perform ulcer/skin care regimen upon admission and as needed Assess ulceration(s) every visit Provide education on ulcer and skin care Treatment  Activities: Skin care regimen initiated : 02/13/2021 Topical wound management initiated : 02/13/2021 Notes: Electronic Signature(s) Signed: 02/27/2021 4:50:33 PM By: Baruch Gouty RN, BSN Entered By: Baruch Gouty on 02/27/2021 10:49:47 -------------------------------------------------------------------------------- Pain Assessment Details Patient Name: Date of Service: [name] RD L. 02/27/2021 10:00 A M Medical Record Number: [id number] Patient Account Number: [id number] Date of Birth/Sex: Treating RN: 13-Apr-1956 (65 y.o. [name] Primary Care Delaila Nand: [name] Other Clinician: Referring Darryle Dennie: Treating Shirley Decamp/Extender: [name] in Treatment: 2 Active Problems Location of Pain Severity and Description of Pain Patient Has Paino No Site Locations Pain Management and Medication Current Pain Management: Electronic Signature(s) Signed: 02/27/2021 2:51:01 PM By: Sandre Kitty Signed: 03/01/2021 6:30:22 PM By: Levan Hurst RN, BSN Entered By: Sandre Kitty on 02/27/2021 10:15:29 -------------------------------------------------------------------------------- Patient/Caregiver Education Details Patient Name: Date of Service: [name] RD L. 5/16/2022andnbsp10:00 A M Medical Record Number: [id number] Patient Account Number: [id number] Date of Birth/Gender: Treating RN: [birth year] (65 y.o. [name] Primary Care Physician: [name] Other Clinician: Referring Physician: Treating Physician/Extender: [name] in Treatment: 2 Education Assessment Education Provided To: Patient Education Topics Provided Venous: Methods: Explain/Verbal Responses: Reinforcements needed, State content correctly Electronic Signature(s) Signed: 02/27/2021 4:50:33 PM By: Baruch Gouty RN, BSN Entered By: Baruch Gouty on  02/27/2021  10:50:15 -------------------------------------------------------------------------------- Wound Assessment Details Patient Name: Date of Service: [name] RD L. 02/27/2021 10:00 A M Medical Record Number: [id number] Patient Account Number: [id number] Date of Birth/Sex: Treating RN: 1956/06/15 (65 y.o. [name] Primary Care Francis Doenges: [name] Other Clinician: Referring Perla Echavarria: Treating Nicola Heinemann/Extender: [name] in Treatment: 2 Wound Status Wound Number: 1 Primary Vasculopathy Etiology: Wound Location: Left, Medial Lower Leg Secondary Diabetic Wound/Ulcer of the Lower Extremity Wounding Event: Trauma Etiology: Date Acquired: 09/06/2020 Wound Open Weeks Of Treatment: 2 Status: Clustered Wound: No Comorbid Cataracts, Anemia, Chronic Obstructive Pulmonary Disease History: (COPD), Sleep Apnea, Arrhythmia, Congestive Heart Failure, Deep Vein Thrombosis, Myocardial Infarction, Peripheral Venous Disease, Type II Diabetes, Osteoarthritis Photos Wound Measurements Length: (cm) 2.2 Width: (cm) 0.7 Depth: (cm) 0.1 Area: (cm) 1.21 Volume: (cm) 0.121 % Reduction in Area: 72% % Reduction in Volume: 72% Epithelialization: Small (1-33%) Wound Description Classification: Full Thickness Without Exposed Support Structures Wound Margin: Flat and Intact Exudate Amount: Medium Exudate Type: Serosanguineous Exudate Color: red, brown Foul Odor After Cleansing: No Slough/Fibrino No Wound Bed Granulation Amount: Large (67-100%) Exposed Structure Granulation Quality: Red Fascia Exposed: No Necrotic Amount: Small (1-33%) Fat Layer (Subcutaneous Tissue) Exposed: Yes Tendon Exposed: No Muscle Exposed: No Joint Exposed: No Bone Exposed: No Treatment Notes Wound #1 (Lower Leg) Wound Laterality: Left, Medial Cleanser Peri-Wound Care Sween Lotion (Moisturizing lotion) Discharge Instruction: Apply moisturizing lotion as  directed Topical Primary Dressing Hydrofera Blue Classic Foam, 2x2 in Discharge Instruction: Moisten with saline prior to applying to wound bed Secondary Dressing Woven Gauze Sponge, Non-Sterile 4x4 in Discharge Instruction: Apply over primary dressing as directed. Secured With Compression Wrap ThreePress (3 layer compression wrap) Discharge Instruction: Apply three layer compression as directed. Compression Stockings Add-Ons Electronic Signature(s) Signed: 02/27/2021 5:26:16 PM By: Sandre Kitty Signed: 03/01/2021 6:30:22 PM By: Levan Hurst RN, BSN Previous Signature: 02/27/2021 2:51:01 PM Version By: Sandre Kitty Entered By: Sandre Kitty on 02/27/2021 17:12:15 -------------------------------------------------------------------------------- [place] Details Patient Name: Date of Service: [name] RD L. 02/27/2021 10:00 A M Medical Record Number: [id number] Patient Account Number: [id number] Date of Birth/Sex: Treating RN: Jul 19, 1956 (65 y.o. [name] Primary Care Syair Fricker: [name] Other Clinician: Referring Kevionna Heffler: Treating Christoph Copelan/Extender: [name] in Treatment: 2 Vital Signs Time Taken: 10:15 Temperature (F): 97.5 Height (in): 70 Pulse (bpm): 76 Weight (lbs): 260 Respiratory Rate (breaths/min): 18 Body Mass Index (BMI): 37.3 Blood Pressure (mmHg): 90/63 Reference Range: 80 - 120 mg / dl Electronic Signature(s) Signed: 02/27/2021 2:51:01 PM By: Sandre Kitty Entered By: Sandre Kitty on 02/27/2021 10:15:22

## 2021-03-03 ENCOUNTER — Other Ambulatory Visit: Payer: Self-pay | Admitting: Internal Medicine

## 2021-03-03 ENCOUNTER — Other Ambulatory Visit: Payer: Self-pay | Admitting: Family Medicine

## 2021-03-04 ENCOUNTER — Encounter: Payer: Self-pay | Admitting: Internal Medicine

## 2021-03-04 DIAGNOSIS — E538 Deficiency of other specified B group vitamins: Secondary | ICD-10-CM | POA: Insufficient documentation

## 2021-03-04 DIAGNOSIS — Z01818 Encounter for other preprocedural examination: Secondary | ICD-10-CM | POA: Insufficient documentation

## 2021-03-04 NOTE — Assessment & Plan Note (Signed)
Age and sex appropriate education and counseling updated with regular exercise and diet Referrals for preventative services - for eye exam Immunizations addressed - for pneumovax Smoking counseling  - none needed Evidence for depression or other mood disorder - none significant Most recent labs reviewed. I have personally reviewed and have noted: 1) the patient's medical and social history 2) The patient's current medications and supplements 3) The patient's height, weight, and BMI have been recorded in the chart

## 2021-03-04 NOTE — Assessment & Plan Note (Signed)
Stable, cont wound clinic,  to f/u any worsening symptoms or concerns

## 2021-03-04 NOTE — Assessment & Plan Note (Signed)
Lab Results  Component Value Date   [id number] 791 02/28/2021   Stable, cont oral replacement - b12 1000 mcg qd

## 2021-03-04 NOTE — Assessment & Plan Note (Signed)
Last vitamin D Lab Results  Component Value Date   VD25OH 24.78 (L) 02/28/2021   Low, to start oral replacement

## 2021-03-04 NOTE — Assessment & Plan Note (Signed)
For f/u psa,  to f/u any worsening symptoms or concerns 

## 2021-03-04 NOTE — Assessment & Plan Note (Signed)
Lab Results  Component Value Date   LDLCALC 162 (H) 10/20/2020   Stable, pt to continue current repatha, low chol diet

## 2021-03-04 NOTE — Assessment & Plan Note (Signed)
Lab Results  Component Value Date   HGBA1C 5.5 02/28/2021   Stable, pt to continue current medical treatment - farxiga

## 2021-03-04 NOTE — Assessment & Plan Note (Signed)
BP Readings from Last 3 Encounters:  02/28/21 118/70  01/31/21 122/88  01/17/21 130/88   Stable, pt to continue medical treatment coreg, entresto

## 2021-03-04 NOTE — Assessment & Plan Note (Signed)
[place] for [employer] from IM

## 2021-03-06 ENCOUNTER — Other Ambulatory Visit: Payer: Self-pay

## 2021-03-06 ENCOUNTER — Encounter (HOSPITAL_BASED_OUTPATIENT_CLINIC_OR_DEPARTMENT_OTHER): Payer: Medicare HMO | Admitting: Internal Medicine

## 2021-03-06 DIAGNOSIS — E1151 Type 2 diabetes mellitus with diabetic peripheral angiopathy without gangrene: Secondary | ICD-10-CM | POA: Diagnosis not present

## 2021-03-06 DIAGNOSIS — I110 Hypertensive heart disease with heart failure: Secondary | ICD-10-CM | POA: Diagnosis not present

## 2021-03-06 DIAGNOSIS — L97829 Non-pressure chronic ulcer of other part of left lower leg with unspecified severity: Secondary | ICD-10-CM

## 2021-03-06 DIAGNOSIS — I5022 Chronic systolic (congestive) heart failure: Secondary | ICD-10-CM | POA: Diagnosis not present

## 2021-03-06 DIAGNOSIS — I872 Venous insufficiency (chronic) (peripheral): Secondary | ICD-10-CM | POA: Diagnosis not present

## 2021-03-06 DIAGNOSIS — E11622 Type 2 diabetes mellitus with other skin ulcer: Secondary | ICD-10-CM | POA: Diagnosis not present

## 2021-03-06 NOTE — Progress Notes (Signed)
[name], [name] ([id number]) Visit Report for 03/06/2021 Chief Complaint Document Details Patient Name: Date of Service: [name] RD L. 03/06/2021 2:15 PM Medical Record Number: [id number] Patient Account Number: [id number] Date of Birth/Sex: Treating RN: 1956/09/13 (65 y.o. [name] Primary Care Provider: [name] Other Clinician: Referring Provider: Treating Provider/Extender: [name] in Treatment: 3 Information Obtained from: Patient Chief Complaint Left lower extremity wound Electronic Signature(s) Signed: 03/06/2021 4:24:03 PM By: [name] Entered By: Kalman Shan on 03/06/2021 16:19:23 -------------------------------------------------------------------------------- Debridement Details Patient Name: Date of Service: [name] RD L. 03/06/2021 2:15 PM Medical Record Number: [id number] Patient Account Number: [id number] Date of Birth/Sex: Treating RN: 11/10/1955 (65 y.o. [name] Primary Care Provider: [name] Other Clinician: Referring Provider: Treating Provider/Extender: [name] in Treatment: 3 Debridement Performed for Assessment: Wound #1 Left,Medial Lower Leg Performed By: Physician Kalman Shan, DO Debridement Type: Debridement Severity of Tissue Pre Debridement: Fat layer exposed Level of Consciousness (Pre-procedure): Awake and Alert Pre-procedure Verification/Time Out Yes - 15:52 Taken: Start Time: 15:52 T Area Debrided (L x W): otal 2.2 (cm) x 0.6 (cm) = 1.32 (cm) Tissue and other material debrided: Viable, Non-Viable, Subcutaneous Level: Skin/Subcutaneous Tissue Debridement Description: Excisional Instrument: Curette Bleeding: Minimum Hemostasis Achieved: Pressure End Time: 15:53 Procedural Pain: 0 Post Procedural Pain: 0 Response to Treatment: Procedure was tolerated well Level of Consciousness (Post- Awake and Alert procedure): Post Debridement  Measurements of Total Wound Length: (cm) 2.2 Width: (cm) 0.6 Depth: (cm) 0.1 Volume: (cm) 0.104 Character of Wound/Ulcer Post Debridement: Improved Severity of Tissue Post Debridement: Fat layer exposed Post Procedure Diagnosis Same as Pre-procedure Electronic Signature(s) Signed: 03/06/2021 4:24:03 PM By: [name] Signed: 03/06/2021 5:27:53 PM By: Levan Hurst RN, BSN Entered By: Levan Hurst on 03/06/2021 15:56:45 -------------------------------------------------------------------------------- HPI Details Patient Name: Date of Service: [name] RD L. 03/06/2021 2:15 PM Medical Record Number: [id number] Patient Account Number: [id number] Date of Birth/Sex: Treating RN: 08/03/1956 (65 y.o. [name] Primary Care Provider: [name] Other Clinician: Referring Provider: Treating Provider/Extender: [name] in Treatment: 3 History of Present Illness HPI Description: Admission 5/2 [name] is a 65 year old male with a past medical history of systolic congestive heart failure, COPD, nonischemic cardiomyopathy, type 2 diabetes, chronic venous insufficiency that presents to the clinic for a 18-month history of nonhealing wound to his left lower extremity. He states that in November 2021 he fell off his porch and Hit his leg against a brick. The wound has healed mostly except for 1 area that is still open. He reports moderate Serosanguineous drainage daily. He has been keeping it covered with Band-Aids. He reports 2 rounds of antibiotics for this issue. He denies pain. He denies current acute signs of infection. 5/9; patient presents for 1 week follow-up. He has been using Hydrofera Blue under 3 layer compression. He has no issues or complaints today. He denies signs of infection. 5/16; patient presents for 1 week follow-up. He has been using Hydrofera Blue under 3 layer compression. He has no issues or complaints today. He denies signs of  infection. He states he overall feels well 5/23; patient presents for 1 week follow-up. He has been using Hydrofera Blue under 3 layer compression. He has no issues or complaints today. He denies signs of infection. Electronic Signature(s) Signed: 03/06/2021 4:24:03 PM By: [name] Entered By: Kalman Shan on 03/06/2021 16:19:48 -------------------------------------------------------------------------------- Physical Exam Details Patient Name: Date of  Service: [name] RD L. 03/06/2021 2:15 PM Medical Record Number: [id number] Patient Account Number: [id number] Date of Birth/Sex: Treating RN: [birth year] (65 y.o. [name] Primary Care Provider: [name] Other Clinician: Referring Provider: Treating Provider/Extender: [name] in Treatment: 3 Constitutional respirations regular, non-labored and within target range for patient.. Cardiovascular 2+ dorsalis pedis/posterior tibialis pulses. Psychiatric pleasant and cooperative. Notes Left lower extremity: The medial aspect has an open wound with granulation tissue present. Surface is still a bit gritty and this was debrided. Overall appears healing. No acute signs of infection. Excellent edema control. Electronic Signature(s) Signed: 03/06/2021 4:24:03 PM By: [name] Entered By: Kalman Shan on 03/06/2021 16:20:28 -------------------------------------------------------------------------------- Physician Orders Details Patient Name: Date of Service: [name], [place]. 03/06/2021 2:15 PM Medical Record Number: [id number] Patient Account Number: [id number] Date of Birth/Sex: Treating RN: 07-08-1956 (65 y.o. [name] Primary Care Provider: [name] Other Clinician: Referring Provider: Treating Provider/Extender: [name] in Treatment: 3 Verbal / Phone Orders: No Diagnosis Coding ICD-10 Coding Code Description [phone] Non-pressure  chronic ulcer of other part of left lower leg with unspecified severity I87.2 Venous insufficiency (chronic) (peripheral) I10 Essential (primary) hypertension I42.8 Other cardiomyopathies J44.9 Chronic obstructive pulmonary disease, unspecified I48.19 Other persistent atrial fibrillation E11.9 Type 2 diabetes mellitus without complications [id number] Chronic systolic (congestive) heart failure Follow-up Appointments ppointment in 2 weeks. - with Dr. Heber Bethune Return A Nurse Visit: - 1 week for rewrap Bathing/ Shower/ Hygiene May shower with protection but do not get wound dressing(s) wet. - use cast protector to keep wrap dry in the shower Edema Control - Lymphedema / SCD / Other Left Lower Extremity Elevate legs to the level of the heart or above for 30 minutes daily and/or when sitting, a frequency of: - throughout the day Avoid standing for long periods of time. Exercise regularly Compression stocking or Garment 20-30 mm/Hg pressure to: - compression stocking to right leg daily Wound Treatment Wound #1 - Lower Leg Wound Laterality: Left, Medial Peri-Wound Care: Sween Lotion (Moisturizing lotion) 1 x Per Week Discharge Instructions: Apply moisturizing lotion as directed Prim Dressing: Hydrofera Blue Classic Foam, 2x2 in 1 x Per Week ary Discharge Instructions: Moisten with saline prior to applying to wound bed Secondary Dressing: Woven Gauze Sponge, Non-Sterile 4x4 in 1 x Per Week Discharge Instructions: Apply over primary dressing as directed. Compression Wrap: ThreePress (3 layer compression wrap) 1 x Per Week Discharge Instructions: Apply three layer compression as directed. Electronic Signature(s) Signed: 03/06/2021 4:24:03 PM By: [name] Entered By: Kalman Shan on 03/06/2021 16:21:47 -------------------------------------------------------------------------------- Problem List Details Patient Name: Date of Service: [name], [name] 03/06/2021 2:15 PM Medical  Record Number: [id number] Patient Account Number: [id number] Date of Birth/Sex: Treating RN: 09/27/1956 (65 y.o. [name] Primary Care Provider: [name] Other Clinician: Referring Provider: Treating Provider/Extender: [name] in Treatment: 3 Active Problems ICD-10 Encounter Code Description Active Date MDM Diagnosis I87.2 Venous insufficiency (chronic) (peripheral) 02/13/2021 No Yes L97.829 Non-pressure chronic ulcer of other part of left lower leg with unspecified 02/13/2021 No Yes severity I10 Essential (primary) hypertension 02/13/2021 No Yes I42.8 Other cardiomyopathies 02/13/2021 No Yes J44.9 Chronic obstructive pulmonary disease, unspecified 02/13/2021 No Yes I48.19 Other persistent atrial fibrillation 02/13/2021 No Yes E11.9 Type 2 diabetes mellitus without complications [id number] No Yes [id number] Chronic systolic (congestive) heart failure 02/13/2021 No Yes Inactive Problems Resolved Problems Electronic Signature(s) Signed: 03/06/2021 4:24:03 PM By: Heber [place],  Janett Billow DO Entered By: Kalman Shan on 03/06/2021 16:06:17 -------------------------------------------------------------------------------- Progress Note Details Patient Name: Date of Service: [name] RD L. 03/06/2021 2:15 PM Medical Record Number: [id number] Patient Account Number: [id number] Date of Birth/Sex: Treating RN: 02/07/1956 (65 y.o. [name] Primary Care Provider: [name] Other Clinician: Referring Provider: Treating Provider/Extender: [name] in Treatment: 3 Subjective Chief Complaint Information obtained from Patient Left lower extremity wound History of Present Illness (HPI) Admission 5/2 Mr. Zurcher is a 65 year old male with a past medical history of systolic congestive heart failure, COPD, nonischemic cardiomyopathy, type 2 diabetes, chronic venous insufficiency that presents to the clinic for a 68-month history of nonhealing  wound to his left lower extremity. He states that in November 2021 he fell off his porch and Hit his leg against a brick. The wound has healed mostly except for 1 area that is still open. He reports moderate Serosanguineous drainage daily. He has been keeping it covered with Band-Aids. He reports 2 rounds of antibiotics for this issue. He denies pain. He denies current acute signs of infection. 5/9; patient presents for 1 week follow-up. He has been using Hydrofera Blue under 3 layer compression. He has no issues or complaints today. He denies signs of infection. 5/16; patient presents for 1 week follow-up. He has been using Hydrofera Blue under 3 layer compression. He has no issues or complaints today. He denies signs of infection. He states he overall feels well 5/23; patient presents for 1 week follow-up. He has been using Hydrofera Blue under 3 layer compression. He has no issues or complaints today. He denies signs of infection. Patient History Information obtained from Patient. Family History Heart Disease - Father, Hypertension - Mother,Father,Siblings, Lung Disease - Mother, Thyroid Problems - Mother, No family history of Cancer, Diabetes, Hereditary Spherocytosis, Kidney Disease, Seizures, Stroke, Tuberculosis. Social History Former smoker - quit 2009, Marital Status - Divorced, Alcohol Use - Never, Drug Use - No History, Caffeine Use - Daily - coffee, diet soda. Medical History Eyes Patient has history of Cataracts Denies history of Glaucoma, Optic Neuritis Hematologic/Lymphatic Patient has history of Anemia Respiratory Patient has history of Chronic Obstructive Pulmonary Disease (COPD), Sleep Apnea - sleeps in recliner Denies history of Asthma, Pneumothorax Cardiovascular Patient has history of Arrhythmia - afib, Congestive Heart Failure, Deep Vein Thrombosis, Myocardial Infarction, Peripheral Venous Disease Endocrine Patient has history of Type II Diabetes Denies history of  Type I Diabetes Genitourinary Denies history of End Stage Renal Disease Integumentary (Skin) Denies history of History of Burn Musculoskeletal Patient has history of Osteoarthritis Oncologic Denies history of Received Chemotherapy, Received Radiation Psychiatric Denies history of Anorexia/bulimia, Confinement Anxiety Medical A Surgical History Notes nd Constitutional Symptoms (General Health) obesity Cardiovascular cardiomyopathy, pacemaker/defibulator Gastrointestinal GERD Endocrine thyrotoxicosis Genitourinary hx kidney stones, BPH Musculoskeletal thoracic and lumbosacral spondylosis Objective Constitutional respirations regular, non-labored and within target range for patient.. Vitals Time Taken: 2:52 PM, Height: 70 in, Weight: 260 lbs, BMI: 37.3, Temperature: 98.1 F, Pulse: 77 bpm, Respiratory Rate: 20 breaths/min, Blood Pressure: 81/59 mmHg. Cardiovascular 2+ dorsalis pedis/posterior tibialis pulses. Psychiatric pleasant and cooperative. General Notes: Left lower extremity: The medial aspect has an open wound with granulation tissue present. Surface is still a bit gritty and this was debrided. Overall appears healing. No acute signs of infection. Excellent edema control. Integumentary (Hair, Skin) Wound #1 status is Open. Original cause of wound was Trauma. The date acquired was: 09/06/2020. The wound has been in treatment 3 weeks. The wound  is located on the Left,Medial Lower Leg. The wound measures 2.2cm length x 0.6cm width x 0.1cm depth; 1.037cm^2 area and 0.104cm^3 volume. There is Fat Layer (Subcutaneous Tissue) exposed. There is no tunneling or undermining noted. There is a medium amount of serosanguineous drainage noted. The wound margin is distinct with the outline attached to the wound base. There is large (67-100%) red granulation within the wound bed. There is no necrotic tissue within the wound bed. General Notes: Slight maceration Assessment Active  Problems ICD-10 Venous insufficiency (chronic) (peripheral) Non-pressure chronic ulcer of other part of left lower leg with unspecified severity Essential (primary) hypertension Other cardiomyopathies Chronic obstructive pulmonary disease, unspecified Other persistent atrial fibrillation Type 2 diabetes mellitus without complications Chronic systolic (congestive) heart failure Patient's wound is stable. It was debrided. Still has a gritty surface although improved. I think patient would benefit greatly from a skin substitute and this was discussed today. He was agreeable with this treatment choice. We will continue for now with Hydrofera Blue under 3 layer compression and see if we can get Apligraf approved. Procedures Wound #1 Pre-procedure diagnosis of Wound #1 is a Vasculopathy located on the Left,Medial Lower Leg .Severity of Tissue Pre Debridement is: Fat layer exposed. There was a Excisional Skin/Subcutaneous Tissue Debridement with a total area of 1.32 sq cm performed by Kalman Shan, DO. With the following instrument(s): Curette to remove Viable and Non-Viable tissue/material. Material removed includes Subcutaneous Tissue. No specimens were taken. A time out was conducted at 15:52, prior to the start of the procedure. A Minimum amount of bleeding was controlled with Pressure. The procedure was tolerated well with a pain level of 0 throughout and a pain level of 0 following the procedure. Post Debridement Measurements: 2.2cm length x 0.6cm width x 0.1cm depth; 0.104cm^3 volume. Character of Wound/Ulcer Post Debridement is improved. Severity of Tissue Post Debridement is: Fat layer exposed. Post procedure Diagnosis Wound #1: Same as Pre-Procedure Plan Follow-up Appointments: Return Appointment in 2 weeks. - with Dr. Heber Ackerman Nurse Visit: - 1 week for rewrap Bathing/ Shower/ Hygiene: May shower with protection but do not get wound dressing(s) wet. - use cast protector to keep wrap  dry in the shower Edema Control - Lymphedema / SCD / Other: Elevate legs to the level of the heart or above for 30 minutes daily and/or when sitting, a frequency of: - throughout the day Avoid standing for long periods of time. Exercise regularly Compression stocking or Garment 20-30 mm/Hg pressure to: - compression stocking to right leg daily WOUND #1: - Lower Leg Wound Laterality: Left, Medial Peri-Wound Care: Sween Lotion (Moisturizing lotion) 1 x Per Week/ Discharge Instructions: Apply moisturizing lotion as directed Prim Dressing: Hydrofera Blue Classic Foam, 2x2 in 1 x Per Week/ ary Discharge Instructions: Moisten with saline prior to applying to wound bed Secondary Dressing: Woven Gauze Sponge, Non-Sterile 4x4 in 1 x Per Week/ Discharge Instructions: Apply over primary dressing as directed. Compression Wrap: ThreePress (3 layer compression wrap) 1 x Per Week/ Discharge Instructions: Apply three layer compression as directed. 1. In office sharp debridement 2. Hydrofera Blue under 3 layer compression 3. See if we can get Apligraf approved by insurance 4. Follow-up in 1 week for nurse visit then with me in 2 weeks Electronic Signature(s) Signed: 03/06/2021 4:24:03 PM By: [name] Entered By: Kalman Shan on 03/06/2021 16:23:29 -------------------------------------------------------------------------------- HxROS Details Patient Name: Date of Service: [name] RD L. 03/06/2021 2:15 PM Medical Record Number: [id number] Patient Account Number: [id number] Date  of Birth/Sex: Treating RN: Oct 12, 1956 (65 y.o. [name] Primary Care Provider: [name] Other Clinician: Referring Provider: Treating Provider/Extender: [name] in Treatment: 3 Information Obtained From Patient Constitutional Symptoms (General Health) Medical History: Past Medical History Notes: obesity Eyes Medical History: Positive for: Cataracts Negative for:  Glaucoma; Optic Neuritis Hematologic/Lymphatic Medical History: Positive for: Anemia Respiratory Medical History: Positive for: Chronic Obstructive Pulmonary Disease (COPD); Sleep Apnea - sleeps in recliner Negative for: Asthma; Pneumothorax Cardiovascular Medical History: Positive for: Arrhythmia - afib; Congestive Heart Failure; Deep Vein Thrombosis; Myocardial Infarction; Peripheral Venous Disease Past Medical History Notes: cardiomyopathy, pacemaker/defibulator Gastrointestinal Medical History: Past Medical History Notes: GERD Endocrine Medical History: Positive for: Type II Diabetes Negative for: Type I Diabetes Past Medical History Notes: thyrotoxicosis Time with diabetes: 10 yrs Treated with: Diet Blood sugar tested every day: No Genitourinary Medical History: Negative for: End Stage Renal Disease Past Medical History Notes: hx kidney stones, BPH Integumentary (Skin) Medical History: Negative for: History of Burn Musculoskeletal Medical History: Positive for: Osteoarthritis Past Medical History Notes: thoracic and lumbosacral spondylosis Oncologic Medical History: Negative for: Received Chemotherapy; Received Radiation Psychiatric Medical History: Negative for: Anorexia/bulimia; Confinement Anxiety HBO Extended History Items Eyes: Cataracts Immunizations Pneumococcal Vaccine: Received Pneumococcal Vaccination: Yes Implantable Devices Yes Family and Social History Cancer: No; Diabetes: No; Heart Disease: Yes - Father; Hereditary Spherocytosis: No; Hypertension: Yes - Mother,Father,Siblings; Kidney Disease: No; Lung Disease: Yes - Mother; Seizures: No; Stroke: No; Thyroid Problems: Yes - Mother; Tuberculosis: No; Former smoker - quit 2009; Marital Status - Divorced; Alcohol Use: Never; Drug Use: No History; Caffeine Use: Daily - coffee, diet soda; Financial Concerns: No; Food, Clothing or Shelter Needs: No; Support System Lacking: No; Transportation  Concerns: No Electronic Signature(s) Signed: 03/06/2021 4:24:03 PM By: [name] Signed: 03/06/2021 5:27:53 PM By: Levan Hurst RN, BSN Entered By: Kalman Shan on 03/06/2021 16:19:55 -------------------------------------------------------------------------------- SuperBill Details Patient Name: Date of Service: [name] RD L. 03/06/2021 Medical Record Number: [id number] Patient Account Number: [id number] Date of Birth/Sex: Treating RN: 03/22/1956 (65 y.o. [name] Primary Care Provider: [name] Other Clinician: Referring Provider: Treating Provider/Extender: [name] in Treatment: 3 Diagnosis Coding ICD-10 Codes Code Description I87.2 Venous insufficiency (chronic) (peripheral) L97.829 Non-pressure chronic ulcer of other part of left lower leg with unspecified severity I10 Essential (primary) hypertension I42.8 Other cardiomyopathies J44.9 Chronic obstructive pulmonary disease, unspecified I48.19 Other persistent atrial fibrillation E11.9 Type 2 diabetes mellitus without complications E95.28 Chronic systolic (congestive) heart failure Facility Procedures CPT4 Code: [id number] Description: [place] - DEB SUBQ TISSUE 20 SQ CM/< ICD-10 Diagnosis Description L97.829 Non-pressure chronic ulcer of other part of left lower leg with unspecified sev I87.2 Venous insufficiency (chronic) (peripheral) Modifier: erity Quantity: 1 Physician Procedures : CPT4 Code Description Modifier [id number] - WC PHYS SUBQ TISS 20 SQ CM ICD-10 Diagnosis Description L97.829 Non-pressure chronic ulcer of other part of left lower leg with unspecified severity I87.2 Venous insufficiency (chronic) (peripheral) Quantity: 1 Electronic Signature(s) Signed: 03/06/2021 4:24:03 PM By: [name] Entered By: Kalman Shan on 03/06/2021 16:23:43

## 2021-03-07 NOTE — Progress Notes (Signed)
KENTRAIL, SHEW ([id number]) Visit Report for 03/06/2021 Arrival Information Details Patient Name: Date of Service: [name] RD L. 03/06/2021 2:15 PM Medical Record Number: [id number] Patient Account Number: [id number] Date of Birth/Sex: Treating RN: Feb 25, 1956 (66 y.o. [name] Primary Care Lihanna Biever: [name] Other Clinician: Referring Ahaana Rochette: Treating Estephan Gallardo/Extender: [name] in Treatment: 3 Visit Information History Since Last Visit Added or deleted any medications: No Patient Arrived: [name] Any new allergies or adverse reactions: No Arrival Time: 14:51 Had a fall or experienced change in No Transfer Assistance: None activities of daily living that may affect Patient Identification Verified: Yes risk of falls: Secondary Verification Process Completed: Yes Signs or symptoms of abuse/neglect since last visito No Patient Requires Transmission-Based Precautions: No Hospitalized since last visit: No Patient Has Alerts: Yes Implantable device outside of the clinic excluding No Patient Alerts: R ABI= 1.33, L ABI=1.16 cellular tissue based products placed in the center since last visit: Has Dressing in Place as Prescribed: Yes Has Compression in Place as Prescribed: Yes Pain Present Now: Yes Electronic Signature(s) Signed: 03/06/2021 6:04:09 PM By: [name] Entered By: [name] on 03/06/2021 14:52:14 -------------------------------------------------------------------------------- Compression Therapy Details Patient Name: Date of Service: [name] RD L. 03/06/2021 2:15 PM Medical Record Number: [id number] Patient Account Number: [id number] Date of Birth/Sex: Treating RN: 1956/09/02 (65 y.o. [name] Primary Care Desmond Tufano: [name] Other Clinician: Referring Roseann Kees: Treating Maricruz Lucero/Extender: [name] in Treatment: 3 Compression Therapy Performed for Wound Assessment: Wound #1  Left,Medial Lower Leg Performed By: Clinician Levan Hurst, RN Compression Type: Three Layer Post Procedure Diagnosis Same as Pre-procedure Electronic Signature(s) Signed: 03/06/2021 5:27:53 PM By: Levan Hurst RN, BSN Entered By: Levan Hurst on 03/06/2021 17:11:27 -------------------------------------------------------------------------------- Encounter Discharge Information Details Patient Name: Date of Service: [name], [name] RD L. 03/06/2021 2:15 PM Medical Record Number: [id number] Patient Account Number: [id number] Date of Birth/Sex: Treating RN: 06-Jan-1956 (65 y.o. [name] Primary Care Maxon Kresse: [name] Other Clinician: Referring Corean Yoshimura: Treating Dominque Marlin/Extender: [name] in Treatment: 3 Encounter Discharge Information Items Post Procedure Vitals Discharge Condition: Stable Temperature (F): 98.1 Ambulatory Status: Cane Pulse (bpm): 77 Discharge Destination: Home Respiratory Rate (breaths/min): 20 Transportation: Private Auto Blood Pressure (mmHg): 81/59 Accompanied By: self Schedule Follow-up Appointment: Yes Clinical Summary of Care: Electronic Signature(s) Signed: 03/06/2021 6:20:16 PM By: Deon Pilling Entered By: Deon Pilling on 03/06/2021 18:17:25 -------------------------------------------------------------------------------- Lower Extremity Assessment Details Patient Name: Date of Service: [name] RD L. 03/06/2021 2:15 PM Medical Record Number: [id number] Patient Account Number: [id number] Date of Birth/Sex: Treating RN: 18-Apr-1956 (65 y.o. [name] Primary Care Kvon Mcilhenny: [name] Other Clinician: Referring Vin Yonke: Treating [name]/Extender: [name] in Treatment: 3 Edema Assessment Assessed: [name]: Yes] [name]: No] Edema: [Left: KG] [Right: s] Calf Left: Right: Point of Measurement: From Medial Instep 30 cm Ankle Left: Right: Point of Measurement: From  Medial Instep 20 cm Vascular Assessment Pulses: Dorsalis Pedis Palpable: [Left:Yes] Electronic Signature(s) Signed: 03/06/2021 6:04:09 PM By: [name] Entered By: [name] on 03/06/2021 15:00:26 -------------------------------------------------------------------------------- Multi Wound Chart Details Patient Name: Date of Service: [name] RD L. 03/06/2021 2:15 PM Medical Record Number: [id number] Patient Account Number: [id number] Date of Birth/Sex: Treating RN: 1956/08/27 (65 y.o. [name] Primary Care Cobi Delph: [name] Other Clinician: Referring Makyle Eslick: Treating Armin Yerger/Extender: [name] in Treatment: 3 Vital Signs Height(in): 70 Pulse(bpm): 77 Weight(lbs): 260 Blood Pressure(mmHg): 81/59 Body Mass  Index(BMI): 37 Temperature(F): 98.1 Respiratory Rate(breaths/min): 20 Photos: [1:No Photos Left, Medial Lower Leg] [N/A:N/A N/A] Wound Location: [1:Trauma] [N/A:N/A] Wounding Event: [1:Vasculopathy] [N/A:N/A] Primary Etiology: [1:Diabetic Wound/Ulcer of the Lower] [N/A:N/A] Secondary Etiology: [1:Extremity Cataracts, Anemia, Chronic] [N/A:N/A] Comorbid History: [1:Obstructive Pulmonary Disease (COPD), Sleep Apnea, Arrhythmia, Congestive Heart Failure, Deep Vein Thrombosis, Myocardial Infarction, Peripheral Venous Disease, Type II Diabetes, Osteoarthritis 09/06/2020] [N/A:N/A] Date Acquired: [1:3] [N/A:N/A] Weeks of Treatment: [1:Open] [N/A:N/A] Wound Status: [1:2.2x0.6x0.1] [N/A:N/A] Measurements L x W x D (cm) [1:1.037] [N/A:N/A] A (cm) : rea [1:0.104] [N/A:N/A] Volume (cm) : [1:76.00%] [N/A:N/A] % Reduction in A [1:rea: 75.90%] [N/A:N/A] % Reduction in Volume: [1:Full Thickness Without Exposed] [N/A:N/A] Classification: [1:Support Structures Medium] [N/A:N/A] Exudate A mount: [1:Serosanguineous] [N/A:N/A] Exudate Type: [1:red, brown] [N/A:N/A] Exudate Color: [1:Distinct, outline attached] [N/A:N/A] Wound  Margin: [1:Large (67-100%)] [N/A:N/A] Granulation A mount: [1:Red] [N/A:N/A] Granulation Quality: [1:None Present (0%)] [N/A:N/A] Necrotic A mount: [1:Fat Layer (Subcutaneous Tissue): Yes N/A] Exposed Structures: [1:Fascia: No Tendon: No Muscle: No Joint: No Bone: No Small (1-33%)] [N/A:N/A] Epithelialization: [1:Debridement - Excisional] [N/A:N/A] Debridement: Pre-procedure Verification/Time Out 15:52 [N/A:N/A] Taken: [1:Subcutaneous] [N/A:N/A] Tissue Debrided: [1:Skin/Subcutaneous Tissue] [N/A:N/A] Level: [1:1.32] [N/A:N/A] Debridement A (sq cm): [1:rea Curette] [N/A:N/A] Instrument: [1:Minimum] [N/A:N/A] Bleeding: [1:Pressure] [N/A:N/A] Hemostasis A chieved: [1:0] [N/A:N/A] Procedural Pain: [1:0] [N/A:N/A] Post Procedural Pain: [1:Procedure was tolerated well] [N/A:N/A] Debridement Treatment Response: [1:2.2x0.6x0.1] [N/A:N/A] Post Debridement Measurements L x W x D (cm) [1:0.104] [N/A:N/A] Post Debridement Volume: (cm) [1:Slight maceration] [N/A:N/A] Assessment Notes: [1:Debridement] [N/A:N/A] Treatment Notes Electronic Signature(s) Signed: 03/06/2021 4:24:03 PM By: [name] Signed: 03/06/2021 5:27:53 PM By: Levan Hurst RN, BSN Entered By: Kalman Shan on 03/06/2021 [place] -------------------------------------------------------------------------------- Multi-Disciplinary Care Plan Details Patient Name: Date of Service: [name] RD L. 03/06/2021 2:15 PM Medical Record Number: [id number] Patient Account Number: [id number] Date of Birth/Sex: Treating RN: March 28, 1956 (65 y.o. [name] Primary Care Annaliese Saez: [name] Other Clinician: Referring Sandia Pfund: Treating Amity Roes/Extender: [name] in Treatment: 3 Multidisciplinary Care Plan reviewed with physician Active Inactive Abuse / Safety / Falls / Self Care Management Nursing Diagnoses: History of Falls Potential for falls Goals: Patient/caregiver will  verbalize/demonstrate measures taken to prevent injury and/or falls Date Initiated: 02/13/2021 Target Resolution Date: 03/13/2021 Goal Status: Active Interventions: Assess fall risk on admission and as needed Assess impairment of mobility on admission and as needed per policy Notes: Venous Leg Ulcer Nursing Diagnoses: Knowledge deficit related to disease process and management Potential for venous Insuffiency (use before diagnosis confirmed) Goals: Patient will maintain optimal edema control Date Initiated: 02/13/2021 Target Resolution Date: 03/13/2021 Goal Status: Active Interventions: Assess peripheral edema status every visit. Compression as ordered Provide education on venous insufficiency Treatment Activities: Therapeutic compression applied : 02/13/2021 Notes: Wound/Skin Impairment Nursing Diagnoses: Impaired tissue integrity Knowledge deficit related to ulceration/compromised skin integrity Goals: Patient/caregiver will verbalize understanding of skin care regimen Date Initiated: 02/13/2021 Target Resolution Date: 03/13/2021 Goal Status: Active Ulcer/skin breakdown will have a volume reduction of 30% by week 4 Date Initiated: 02/13/2021 Target Resolution Date: 03/13/2021 Goal Status: Active Interventions: Assess patient/caregiver ability to obtain necessary supplies Assess patient/caregiver ability to perform ulcer/skin care regimen upon admission and as needed Assess ulceration(s) every visit Provide education on ulcer and skin care Treatment Activities: Skin care regimen initiated : 02/13/2021 Topical wound management initiated : 02/13/2021 Notes: Electronic Signature(s) Signed: 03/06/2021 5:27:53 PM By: Levan Hurst RN, BSN Entered By: Levan Hurst on 03/06/2021 15:05:02 -------------------------------------------------------------------------------- Pain Assessment Details Patient Name: Date of Service: [name], [name]  RD L. 03/06/2021 2:15 PM Medical Record Number:  [id number] Patient Account Number: [id number] Date of Birth/Sex: Treating RN: [birth year] (65 y.o. [name] Primary Care Geena Weinhold: [name] Other Clinician: Referring Athena Baltz: Treating Akashdeep Chuba/Extender: [name] in Treatment: 3 Active Problems Location of Pain Severity and Description of Pain Patient Has Paino Yes Site Locations With Dressing Change: Yes Duration of the Pain. Constant / Intermittento Intermittent Rate the pain. Current Pain Level: 3 Character of Pain Describe the Pain: Aching, Burning, Tender Pain Management and Medication Current Pain Management: Medication: Yes Cold Application: No Rest: Yes Massage: No Activity: No T.E.N.S.: No Heat Application: No Leg drop or elevation: No Is the Current Pain Management Adequate: Inadequate How does your wound impact your activities of daily livingo Sleep: No Bathing: No Appetite: No Relationship With Others: No Bladder Continence: No Emotions: No Bowel Continence: No Work: No Toileting: No Drive: No Dressing: No Hobbies: No Electronic Signature(s) Signed: 03/06/2021 6:04:09 PM By: [name] Entered By: [name] on 03/06/2021 14:59:54 -------------------------------------------------------------------------------- Patient/Caregiver Education Details Patient Name: Date of Service: [name] RD L. 5/23/2022andnbsp2:15 PM Medical Record Number: [id number] Patient Account Number: [id number] Date of Birth/Gender: Treating RN: May 20, 1956 (65 y.o. [name] Primary Care Physician: [name] Other Clinician: Referring Physician: Treating Physician/Extender: [name] in Treatment: 3 Education Assessment Education Provided To: Patient Education Topics Provided Wound/Skin Impairment: Methods: Explain/Verbal Responses: State content correctly Electronic Signature(s) Signed: 03/06/2021 5:27:53 PM By: Levan Hurst RN,  BSN Entered By: Levan Hurst on 03/06/2021 17:11:35 -------------------------------------------------------------------------------- Wound Assessment Details Patient Name: Date of Service: [name] RD L. 03/06/2021 2:15 PM Medical Record Number: [id number] Patient Account Number: [id number] Date of Birth/Sex: Treating RN: 07-Sep-1956 (65 y.o. [name] Primary Care Athanasia Stanwood: [name] Other Clinician: Referring Hermon Zea: Treating Raynell Scott/Extender: [name] in Treatment: 3 Wound Status Wound Number: 1 Primary Vasculopathy Etiology: Wound Location: Left, Medial Lower Leg Secondary Diabetic Wound/Ulcer of the Lower Extremity Wounding Event: Trauma Etiology: Date Acquired: 09/06/2020 Wound Open Weeks Of Treatment: 3 Status: Clustered Wound: No Comorbid Cataracts, Anemia, Chronic Obstructive Pulmonary Disease History: (COPD), Sleep Apnea, Arrhythmia, Congestive Heart Failure, Deep Vein Thrombosis, Myocardial Infarction, Peripheral Venous Disease, Type II Diabetes, Osteoarthritis Photos Wound Measurements Length: (cm) 2.2 Width: (cm) 0.6 Depth: (cm) 0.1 Area: (cm) 1.037 Volume: (cm) 0.104 % Reduction in Area: 76% % Reduction in Volume: 75.9% Epithelialization: Small (1-33%) Tunneling: No Undermining: No Wound Description Classification: Full Thickness Without Exposed Support Structures Wound Margin: Distinct, outline attached Exudate Amount: Medium Exudate Type: Serosanguineous Exudate Color: red, brown Foul Odor After Cleansing: No Slough/Fibrino No Wound Bed Granulation Amount: Large (67-100%) Exposed Structure Granulation Quality: Red Fascia Exposed: No Necrotic Amount: None Present (0%) Fat Layer (Subcutaneous Tissue) Exposed: Yes Tendon Exposed: No Muscle Exposed: No Joint Exposed: No Bone Exposed: No Assessment Notes Slight maceration Treatment Notes Wound #1 (Lower Leg) Wound Laterality: Left,  Medial Cleanser Peri-Wound Care Sween Lotion (Moisturizing lotion) Discharge Instruction: Apply moisturizing lotion as directed Topical Primary Dressing Hydrofera Blue Classic Foam, 2x2 in Discharge Instruction: Moisten with saline prior to applying to wound bed Secondary Dressing Woven Gauze Sponge, Non-Sterile 4x4 in Discharge Instruction: Apply over primary dressing as directed. Secured With Compression Wrap ThreePress (3 layer compression wrap) Discharge Instruction: Apply three layer compression as directed. Compression Stockings Add-Ons Electronic Signature(s) Signed: 03/06/2021 5:06:59 PM By: Sandre Kitty Signed: 03/06/2021 6:04:09 PM By: [name] Entered By: Sandre Kitty on 03/06/2021 17:06:19 -------------------------------------------------------------------------------- Vitals  Details Patient Name: Date of Service: [name] RD L. 03/06/2021 2:15 PM Medical Record Number: [id number] Patient Account Number: [id number] Date of Birth/Sex: Treating RN: 1955/11/07 (65 y.o. [name] Primary Care Alana Dayton: [name] Other Clinician: Referring Tavio Biegel: Treating Jacob Cicero/Extender: [name] in Treatment: 3 Vital Signs Time Taken: 14:52 Temperature (F): 98.1 Height (in): 70 Pulse (bpm): 77 Weight (lbs): 260 Respiratory Rate (breaths/min): 20 Body Mass Index (BMI): 37.3 Blood Pressure (mmHg): 81/59 Reference Range: 80 - 120 mg / dl Electronic Signature(s) Signed: 03/06/2021 6:04:09 PM By: [name] Entered By: [name] on 03/06/2021 14:56:27

## 2021-03-08 ENCOUNTER — Encounter: Payer: Self-pay | Admitting: Internal Medicine

## 2021-03-08 ENCOUNTER — Ambulatory Visit: Payer: Medicare HMO | Admitting: Orthopaedic Surgery

## 2021-03-14 ENCOUNTER — Encounter (HOSPITAL_BASED_OUTPATIENT_CLINIC_OR_DEPARTMENT_OTHER): Payer: Medicare HMO | Admitting: Internal Medicine

## 2021-03-14 ENCOUNTER — Other Ambulatory Visit: Payer: Self-pay

## 2021-03-14 DIAGNOSIS — L97829 Non-pressure chronic ulcer of other part of left lower leg with unspecified severity: Secondary | ICD-10-CM | POA: Diagnosis not present

## 2021-03-14 DIAGNOSIS — I5022 Chronic systolic (congestive) heart failure: Secondary | ICD-10-CM | POA: Diagnosis not present

## 2021-03-14 DIAGNOSIS — I110 Hypertensive heart disease with heart failure: Secondary | ICD-10-CM | POA: Diagnosis not present

## 2021-03-14 DIAGNOSIS — E11622 Type 2 diabetes mellitus with other skin ulcer: Secondary | ICD-10-CM | POA: Diagnosis not present

## 2021-03-14 DIAGNOSIS — I872 Venous insufficiency (chronic) (peripheral): Secondary | ICD-10-CM | POA: Diagnosis not present

## 2021-03-14 DIAGNOSIS — E1151 Type 2 diabetes mellitus with diabetic peripheral angiopathy without gangrene: Secondary | ICD-10-CM | POA: Diagnosis not present

## 2021-03-14 NOTE — Progress Notes (Signed)
**Note Zachary-Identified via Obfuscation** [name], [name] ([id number]) Visit Report for 03/14/2021 Arrival Information Details Patient Name: Date of Service: [name] RD L. 03/14/2021 12:30 PM Medical Record Number: [id number] Patient Account Number: [id number] Date of Birth/Sex: Treating RN: 04-06-1956 (65 y.o. [name] Primary Care Taksh Hjort: [name] Other Clinician: Referring Sharena Dibenedetto: Treating Rocklin Soderquist/Extender: [name] in Treatment: 4 Visit Information History Since Last Visit Added or deleted any medications: No Patient Arrived: [name] Any new allergies or adverse reactions: No Arrival Time: 12:48 Had a fall or experienced change in No Accompanied By: self activities of daily living that may affect Transfer Assistance: None risk of falls: Patient Identification Verified: Yes Signs or symptoms of abuse/neglect since last visito No Secondary Verification Process Completed: Yes Hospitalized since last visit: No Patient Requires Transmission-Based Precautions: No Implantable device outside of the clinic excluding No Patient Has Alerts: Yes cellular tissue based products placed in the center Patient Alerts: R ABI= 1.33, L ABI=1.16 since last visit: Has Dressing in Place as Prescribed: Yes Has Compression in Place as Prescribed: Yes Pain Present Now: No Electronic Signature(s) Signed: 03/14/2021 5:34:16 PM By: Deon Pilling Entered By: Deon Pilling on 03/14/2021 13:01:50 -------------------------------------------------------------------------------- Compression Therapy Details Patient Name: Date of Service: [name] RD L. 03/14/2021 12:30 PM Medical Record Number: [id number] Patient Account Number: [id number] Date of Birth/Sex: Treating RN: 03/16/1956 (65 y.o. [name] Primary Care Cherron Blitzer: [name] Other Clinician: Referring Madisan Bice: Treating Albeiro Trompeter/Extender: [name] in Treatment: 4 Compression Therapy Performed for Wound  Assessment: Wound #1 Left,Medial Lower Leg Performed By: Clinician Deon Pilling, RN Compression Type: Three Layer Electronic Signature(s) Signed: 03/14/2021 5:34:16 PM By: Deon Pilling Entered By: Deon Pilling on 03/14/2021 13:02:44 -------------------------------------------------------------------------------- Encounter Discharge Information Details Patient Name: Date of Service: [name], [name] RD L. 03/14/2021 12:30 PM Medical Record Number: [id number] Patient Account Number: [id number] Date of Birth/Sex: Treating RN: Dec 12, 1955 (65 y.o. [name] Primary Care Sami Roes: [name] Other Clinician: Referring Selicia Windom: Treating Drako Maese/Extender: [name] in Treatment: 4 Encounter Discharge Information Items Discharge Condition: Stable Ambulatory Status: Cane Discharge Destination: Home Transportation: Private Auto Accompanied By: self Schedule Follow-up Appointment: Yes Clinical Summary of Care: Electronic Signature(s) Signed: 03/14/2021 5:34:16 PM By: Deon Pilling Entered By: Deon Pilling on 03/14/2021 13:03:11 -------------------------------------------------------------------------------- Patient/Caregiver Education Details Patient Name: Date of Service: [name] RD L. 5/31/2022andnbsp12:30 PM Medical Record Number: [id number] Patient Account Number: [id number] Date of Birth/Gender: Treating RN: 1956/07/20 (65 y.o. [name] Primary Care Physician: [name] Other Clinician: Referring Physician: Treating Physician/Extender: [name] in Treatment: 4 Education Assessment Education Provided To: Patient Education Topics Provided Wound/Skin Impairment: Handouts: Skin Care Do's and Dont's Methods: Explain/Verbal Responses: Reinforcements needed Electronic Signature(s) Signed: 03/14/2021 5:34:16 PM By: Deon Pilling Entered By: Deon Pilling on 03/14/2021  13:03:01 -------------------------------------------------------------------------------- Wound Assessment Details Patient Name: Date of Service: [name] RD L. 03/14/2021 12:30 PM Medical Record Number: [id number] Patient Account Number: [id number] Date of Birth/Sex: Treating RN: September 09, 1956 (65 y.o. [name] Primary Care Tasman Zapata: [name] Other Clinician: Referring Ariany Kesselman: Treating Claudio Mondry/Extender: [name] in Treatment: 4 Wound Status Wound Number: 1 Primary Etiology: Vasculopathy Wound Location: Left, Medial Lower Leg Secondary Etiology: Diabetic Wound/Ulcer of the Lower Extremity Wounding Event: Trauma Wound Status: Open Date Acquired: 09/06/2020 Weeks Of Treatment: 4 Clustered Wound: No Wound Measurements Length: (cm) 2.2 Width: (cm) 0.6 Depth: (cm) 0.1 Area: (cm) 1.037 Volume: (cm) 0.104 % Reduction  in Area: 76% % Reduction in Volume: 75.9% Wound Description Classification: Full Thickness Without Exposed Support Structur es Treatment Notes Wound #1 (Lower Leg) Wound Laterality: Left, Medial Cleanser Peri-Wound Care Sween Lotion (Moisturizing lotion) Discharge Instruction: Apply moisturizing lotion as directed Topical Primary Dressing Hydrofera Blue Classic Foam, 2x2 in Discharge Instruction: Moisten with saline prior to applying to wound bed Secondary Dressing Woven Gauze Sponge, Non-Sterile 4x4 in Discharge Instruction: Apply over primary dressing as directed. Secured With Compression Wrap ThreePress (3 layer compression wrap) Discharge Instruction: Apply three layer compression as directed. Compression Stockings Add-Ons Electronic Signature(s) Signed: 03/14/2021 5:34:16 PM By: Deon Pilling Entered By: Deon Pilling on 03/14/2021 13:02:10 -------------------------------------------------------------------------------- Vitals Details Patient Name: Date of Service: [name], [name] 03/14/2021 12:30  PM Medical Record Number: [id number] Patient Account Number: [id number] Date of Birth/Sex: Treating RN: 1956-02-12 (65 y.o. [name], Meta.Reding Primary Care Kindal Ponti: [name] Other Clinician: Referring [name]: Treating Aiya Keach/Extender: [name] in Treatment: 4 Vital Signs Time Taken: 12:48 Temperature (F): 97.7 Height (in): 70 Pulse (bpm): 76 Weight (lbs): 260 Respiratory Rate (breaths/min): 20 Body Mass Index (BMI): 37.3 Blood Pressure (mmHg): 106/69 Reference Range: 80 - 120 mg / dl Electronic Signature(s) Signed: 03/14/2021 5:34:16 PM By: Deon Pilling Entered By: Deon Pilling on 03/14/2021 13:02:04

## 2021-03-15 NOTE — Progress Notes (Signed)
[name], Zachary ([id number]) Visit Report for 03/14/2021 SuperBill Details Patient Name: Date of Service: [name] RD L. 03/14/2021 Medical Record Number: [id number] Patient Account Number: [id number] Date of Birth/Sex: Treating RN: [birth year] (65 y.o. [name], Meta.Reding Primary Care Provider: [name] Other Clinician: Referring Provider: Treating Provider/Extender: [name] in Treatment: 4 Diagnosis Coding ICD-10 Codes Code Description I87.2 Venous insufficiency (chronic) (peripheral) L97.829 Non-pressure chronic ulcer of other part of left lower leg with unspecified severity I10 Essential (primary) hypertension I42.8 Other cardiomyopathies J44.9 Chronic obstructive pulmonary disease, unspecified I48.19 Other persistent atrial fibrillation E11.9 Type 2 diabetes mellitus without complications M01.02 Chronic systolic (congestive) heart failure Facility Procedures CPT4 Code Description Modifier Quantity [id number] (Facility Use Only) [id number] - APPLY MULTLAY COMPRS LWR LT LEG 1 Electronic Signature(s) Signed: 03/14/2021 5:34:16 PM By: Deon Pilling Signed: 03/15/2021 9:53:33 AM By: [name] Entered By: Deon Pilling on 03/14/2021 13:03:28

## 2021-03-16 ENCOUNTER — Telehealth: Payer: Self-pay

## 2021-03-16 NOTE — Chronic Care Management (AMB) (Signed)
  Chronic Care Management   Outreach Note  03/16/2021 Name: GLENMORE KARL MRN: [id number] DOB: Aug 04, 1956  [name] is a 65 y.o. year old male who is a primary care patient of [name], MD. I reached out to [name] by phone today in response to a referral sent by Mr. Valma Cava PCP, [name], MD     An unsuccessful telephone outreach was attempted today. The patient was referred to the case management team for assistance with care management and care coordination.   Follow Up Plan: A HIPAA compliant phone message was left for the patient providing contact information and requesting a return call.  The care management team will reach out to the patient again over the next 7 days.  If patient returns call to provider office, please advise to call [facility]  at [place], [place], [place], [place] [address] Direct Dial: [phone] [email or web address]: [email or web address]

## 2021-03-17 ENCOUNTER — Encounter: Payer: Self-pay | Admitting: Internal Medicine

## 2021-03-17 ENCOUNTER — Ambulatory Visit (INDEPENDENT_AMBULATORY_CARE_PROVIDER_SITE_OTHER): Payer: Medicare HMO | Admitting: Internal Medicine

## 2021-03-17 ENCOUNTER — Other Ambulatory Visit: Payer: Self-pay

## 2021-03-17 VITALS — BP 112/70 | HR 78 | Temp 98.2°F | Ht 70.0 in | Wt 254.0 lb

## 2021-03-17 DIAGNOSIS — E538 Deficiency of other specified B group vitamins: Secondary | ICD-10-CM | POA: Diagnosis not present

## 2021-03-17 DIAGNOSIS — I428 Other cardiomyopathies: Secondary | ICD-10-CM

## 2021-03-17 DIAGNOSIS — E1165 Type 2 diabetes mellitus with hyperglycemia: Secondary | ICD-10-CM

## 2021-03-17 DIAGNOSIS — I4819 Other persistent atrial fibrillation: Secondary | ICD-10-CM

## 2021-03-17 DIAGNOSIS — Z23 Encounter for immunization: Secondary | ICD-10-CM | POA: Diagnosis not present

## 2021-03-17 DIAGNOSIS — I10 Essential (primary) hypertension: Secondary | ICD-10-CM

## 2021-03-17 DIAGNOSIS — I255 Ischemic cardiomyopathy: Secondary | ICD-10-CM | POA: Diagnosis not present

## 2021-03-17 DIAGNOSIS — E7800 Pure hypercholesterolemia, unspecified: Secondary | ICD-10-CM | POA: Diagnosis not present

## 2021-03-17 DIAGNOSIS — E559 Vitamin D deficiency, unspecified: Secondary | ICD-10-CM

## 2021-03-17 NOTE — Progress Notes (Signed)
[place] [facility] [place] Phone: [phone] Subjective:   [name], am serving as a scribe for Dr. Hulan Saas. This visit occurred during the SARS-CoV-2 public health emergency.  Safety protocols were in place, including screening questions prior to the visit, additional usage of staff PPE, and extensive cleaning of exam room while observing appropriate contact time as indicated for disinfecting solutions.   I'm seeing this patient by the request  of:  [name], MD  CC: Shoulder and knee pain  GXQ:[id number]   01/17/2021 Bilateral injections given again today.  We discussed the possibility of referral to orthopedic surgeon which patient declined.  Discussed with patient to keep active.  Patient will be given a rolling walker with bench to see if they will be more beneficial.  Patient does have instability of the left knee noted.  Patient will follow up with me again in 3 months  Very difficult to treat patient secondary to patient's other comorbidities.  Patient is also on a blood thinner which makes anti-inflammatories impossible.  Injections seem to help him more than anything else.  Has seen pain management.  Repeat injection given again today.  Severe arthritic changes of the knee with instability.  Patient does not want a knee replacement with how much difficulty patient is having with the contralateral side.  Patient encouraged to potentially lose weight.  Wear his brace on a more regular basis but finds it cumbersome.  Follow-up with me again in 3 months  Update 03/21/2021 [name] is a 65 y.o. male coming in with complaint of L knee and bilateral shoulder pain. Patient was to meet with Dr. Mayer Camel and Dr. Rush Farmer to discuss L TKR. Patient states that he spoke with [facility] and they will not perform surgery. Also having L shoulder pain that is causing him so more pain than last visit.  Patient shoulder does  have known arthritic changes.  He does have rotator cuff arthropathy bilaterally.  Having increasing discomfort on the left shoulder.  Using 2 canes to ambulate at this time.  Seeing Cardiologist on Monday next week.     Past Medical History:  Diagnosis Date  . AICD (automatic cardioverter/defibrillator) present   . Anemia    supposed to be taking Vit B but doesn't  . ANXIETY    takes Xanax nightly  . Arthritis   . Asthma    Albuterol prn and Advair daily;also takes Prednisone daily  . Atrial fibrillation ([place]) 09/06/2015  . Cardiomyopathy [facility])    a. EF 25% TEE July 2013; b. EF normalized 2015;  c. 03/2015 Echo: EF 40-45%, difrf HK, PASP 38 mmHg, Mild MR, sev LAE/RAE.  [name] CHF (congestive heart failure) ([place])   . Chronic constipation    takes OTC stool softener  . COPD (chronic obstructive pulmonary disease) ([place])    "one dr says COPD; one dr says emphysema" (09/18/2017)  . DEPRESSION    takes Zoloft and Doxepin daily  . Diverticulitis   . DYSKINESIA, ESOPHAGUS   . Emphysema of lung [facility])    "one dr says COPD; one dr says emphysema" (09/18/2017)  . Essential hypertension       . FIBROMYALGIA   . GERD (gastroesophageal reflux disease)       . Glaucoma   . HYPERLIPIDEMIA    a. Intolerant to statins.  . INSOMNIA    takes Ambien nightly  . Myocardial infarction [facility])    a. 2012  Myoview notable for prior infarct;  b. 03/2015 Lexiscan CL: EF 37%, diff HK, small area of inferior infarct from apex to base-->Med Rx.  . Myocardial infarction ([place])   . O2 dependent    "2.5L q hs & prn" (09/18/2017)  . Paroxysmal atrial fibrillation ([facility])    a. CHA2DS2VASc = 3--> takes Coumadin;  b. 03/15/2015 Successful TEE/DCCV;  c. 03/2015 recurrent afib, Amio d/c'd in setting of hyperthyroidism.  . Peripheral neuropathy   . Pneumonia 12/2016  . Rash and other nonspecific skin eruption 04/12/2009   [name] cause found saw dermatologists x 2 and allergist  . SLEEP APNEA, OBSTRUCTIVE    a. doesn't use CPAP   . Syncope    a. 03/2015 s/p MDT LINQ.  [name] Type II diabetes mellitus ([place])        Past Surgical History:  Procedure Laterality Date  . ACNE CYST REMOVAL     2 on back   . AV NODE ABLATION N/A 10/25/2017   Procedure: AV NODE ABLATION;  Surgeon: Deboraha Sprang, MD;  Location: [place] CV LAB;  Service: Cardiovascular;  Laterality: N/A;  . BIV ICD INSERTION CRT-D N/A 09/18/2017   Procedure: BIV ICD INSERTION CRT-D;  Surgeon: Deboraha Sprang, MD;  Location: [place] CV LAB;  Service: Cardiovascular;  Laterality: N/A;  . CARDIAC CATHETERIZATION N/A 03/21/2016   Procedure: Right/Left Heart Cath and Coronary Angiography;  Surgeon: Larey Dresser, MD;  Location: [place] CV LAB;  Service: Cardiovascular;  Laterality: N/A;  . CARDIOVERSION  04/18/2012   Procedure: CARDIOVERSION;  Surgeon: Fay Records, MD;  Location: [place];  Service: Cardiovascular;  Laterality: N/A;  . CARDIOVERSION  04/25/2012   Procedure: CARDIOVERSION;  Surgeon: Thayer Headings, MD;  Location: [place];  Service: Cardiovascular;  Laterality: N/A;  . CARDIOVERSION  04/25/2012   Procedure: CARDIOVERSION;  Surgeon: Fay Records, MD;  Location: [place];  Service: Cardiovascular;  Laterality: N/A;  . CARDIOVERSION  05/09/2012   Procedure: CARDIOVERSION;  Surgeon: Sherren Mocha, MD;  Location: [place];  Service: Cardiovascular;  Laterality: N/A;  changed from crenshaw to cooper by trish/leone-endo  . CARDIOVERSION N/A 03/15/2015   Procedure: CARDIOVERSION;  Surgeon: Thayer Headings, MD;  Location: [facility] ENDOSCOPY;  Service: Cardiovascular;  Laterality: N/A;  . COLONOSCOPY    . COLONOSCOPY WITH PROPOFOL N/A 10/21/2014   Procedure: COLONOSCOPY WITH PROPOFOL;  Surgeon: Ladene Artist, MD;  Location: [facility] ENDOSCOPY;  Service: Endoscopy;  Laterality: N/A;  . EP IMPLANTABLE DEVICE N/A 04/06/2015   Procedure: Loop Recorder Insertion;  Surgeon: Evans Lance, MD;  Location: [place] CV LAB;  Service: Cardiovascular;  Laterality: N/A;  .  ESOPHAGOGASTRODUODENOSCOPY    . JOINT REPLACEMENT    . LOOP RECORDER REMOVAL N/A 09/18/2017   Procedure: LOOP RECORDER REMOVAL;  Surgeon: Deboraha Sprang, MD;  Location: [place] CV LAB;  Service: Cardiovascular;  Laterality: N/A;  . RIGHT/LEFT HEART CATH AND CORONARY ANGIOGRAPHY N/A 01/28/2017   Procedure: Right/Left Heart Cath and Coronary Angiography;  Surgeon: Larey Dresser, MD;  Location: [place] CV LAB;  Service: Cardiovascular;  Laterality: N/A;  . TEE WITHOUT CARDIOVERSION  04/25/2012   Procedure: TRANSESOPHAGEAL ECHOCARDIOGRAM (TEE);  Surgeon: Thayer Headings, MD;  Location: [place];  Service: Cardiovascular;  Laterality: N/A;  . TEE WITHOUT CARDIOVERSION N/A 03/15/2015   Procedure: TRANSESOPHAGEAL ECHOCARDIOGRAM (TEE);  Surgeon: Thayer Headings, MD;  Location: [place];  Service: Cardiovascular;  Laterality: N/A;  . TONSILLECTOMY AND ADENOIDECTOMY    .  TOTAL KNEE ARTHROPLASTY Right 06/15/2014   Procedure: TOTAL KNEE ARTHROPLASTY;  Surgeon: Renette Butters, MD;  Location: [place];  Service: Orthopedics;  Laterality: Right;   Social History   Socioeconomic History  . Marital status: Divorced    Spouse name: Not on file  . Number of children: 2  . Years of education: Not on file  . Highest education level: Not on file  Occupational History  . Occupation: retired/disabled. prev worked in Therapist, sports.    Employer: DISABLED  Tobacco Use  . Smoking status: Former Smoker    Packs/day: 2.00    Years: 30.00    Pack years: 60.00    Types: Cigarettes    Quit date: 10/16/2007    Years since quitting: 13.4  . Smokeless tobacco: Never Used  Vaping Use  . Vaping Use: Never used  Substance and Sexual Activity  . Alcohol use: [name]  . Drug use: [name]  . Sexual activity: Not Currently  Other Topics Concern  . Not on file  Social History Narrative   Lives alone.   Social Determinants of Health   Financial Resource Strain: Not on file  Food Insecurity: Not on file   Transportation Needs: Not on file  Physical Activity: Not on file  Stress: Not on file  Social Connections: Not on file   Allergies  Allergen Reactions  . Amiodarone Other (See Comments)    hyperthyroidism  . Statins Other (See Comments)    myalgia  . Tape Other (See Comments)    Skin Tears Use Paper Tape Only   Family History  Problem Relation Age of Onset  . COPD Mother   . Asthma Mother   . Colon polyps Mother   . Allergies Mother   . Hypothyroidism Mother   . Asthma Maternal Grandmother   . Colon cancer Neg Hx     Current Outpatient Medications (Endocrine & Metabolic):  .  dapagliflozin propanediol (FARXIGA) 10 MG TABS tablet, Take 1 tablet (10 mg total) by mouth daily before breakfast.  Current Outpatient Medications (Cardiovascular):  .  carvedilol (COREG) 12.5 MG tablet, Take 1 tablet (12.5 mg total) by mouth 2 (two) times daily with a meal. .  digoxin (LANOXIN) 0.25 MG tablet, TAKE 1/2 TABLET BY MOUTH EVERY DAY .  Evolocumab (REPATHA SURECLICK) 474 MG/ML SOAJ, Inject 1 pen into the skin every 14 (fourteen) days. .  sacubitril-valsartan (ENTRESTO) 49-51 MG, Take 0.5 tablets by mouth 2 (two) times daily. [name]  spironolactone (ALDACTONE) 25 MG tablet, TAKE 1 TABLET BY MOUTH EVERY DAY .  torsemide (DEMADEX) 20 MG tablet, Take 4 tablets (80 mg total) by mouth in the morning AND 3 tablets (60 mg total) every evening. .  metolazone (ZAROXOLYN) 2.5 MG tablet, Take 1 tablet (2.5 mg total) by mouth as directed. (Patient not taking: Reported on 01/31/2021)  Current Outpatient Medications (Respiratory):  .  albuterol (VENTOLIN HFA) 108 (90 Base) MCG/ACT inhaler, INHALE 2 PUFFS 4 TIMES A DAY as needed .  fluticasone (FLONASE) 50 MCG/ACT nasal spray, PLACE 2 SPRAYS INTO BOTH NOSTRILS DAILY AS NEEDED FOR ALLERGIES OR RHINITIS.  Current Outpatient Medications (Analgesics):  .  acetaminophen (TYLENOL) 500 MG tablet, Take 500 mg by mouth every 6 (six) hours as needed. .  traMADol  (ULTRAM) 50 MG tablet, TAKE 1 TABLET BY MOUTH DAILY AS NEEDED.  Current Outpatient Medications (Hematological):  .  warfarin (COUMADIN) 10 MG tablet, Take 10 mg by mouth daily. Mon,wed,fri,sat  Current Outpatient Medications (Other):  [name]  ALPRAZolam (  XANAX) 0.5 MG tablet, TAKE 2 TABLETS BY MOUTH AT BEDTIME .  AMBULATORY NON FORMULARY MEDICATION, Rolling walker with a bench .  augmented betamethasone dipropionate (DIPROLENE-AF) 0.05 % cream, Apply topically. .  carisoprodol (SOMA) 350 MG tablet, TAKE 1 TABLET BY MOUTH THREE TIMES A DAY AS NEEDED FOR MUSCLE SPASMS .  Cholecalciferol 100 MCG (4000 UT) CAPS, 1 tab by mouth once daily .  doxepin (SINEQUAN) 10 MG capsule, Take 2 capsules (20 mg total) by mouth at bedtime as needed. .  Dupilumab (DUPIXENT) 300 MG/2ML SOPN, Inject 1 Dose into the skin every 14 (fourteen) days. .  hydrOXYzine (ATARAX/VISTARIL) 10 MG tablet, TAKE 1 TABLET (10 MG TOTAL) BY MOUTH 3 (THREE) TIMES DAILY AS NEEDED FOR ITCHING. .  lidocaine (LIDODERM) 5 %, APPLY 1 PATCH TO AFFECTED AREA FOR UP TO 12 HOURS . DO NOT USE MORE THAN ONE PATCH IN 24 HOURS. [name]  omeprazole (PRILOSEC) 20 MG capsule, TAKE 1 CAPSULE BY MOUTH TWICE A DAY .  potassium chloride SA (KLOR-CON) 20 MEQ tablet, Take 1 tablet (20 mEq total) by mouth daily. .  tamsulosin (FLOMAX) 0.4 MG CAPS capsule,  .  traZODone (DESYREL) 100 MG tablet, TAKE 1 TABLET BY MOUTH EVERY DAY AT BEDTIME AS NEEDED .  TUMS 500 MG chewable tablet, Chew 500-2,000 mg by mouth every 4 (four) hours as needed for indigestion or heartburn.  .  valACYclovir (VALTREX) 1000 MG tablet, TAKE 1 TABLET BY MOUTH THREE TIMES A DAY .  venlafaxine XR (EFFEXOR-XR) 75 MG 24 hr capsule, TAKE 1 CAPSULE BY MOUTH DAILY WITH BREAKFAST. [name]  zolpidem (AMBIEN) 10 MG tablet, TAKE 1 TABLET BY MOUTH AT BEDTIME AS NEEDED   Reviewed prior external information including notes and imaging from  primary care provider As well as notes that were available from care  everywhere and other healthcare systems.  Past medical history, social, surgical and family history all reviewed in electronic medical record.  [name] pertanent information unless stated regarding to the chief complaint.   Review of Systems:  [name] headache, visual changes, nausea, vomiting, diarrhea, constipation, dizziness, abdominal pain, skin rash, fevers, chills, night sweats, weight loss, swollen lymph nodes, body aches, joint swelling, chest pain, shortness of breath, mood changes. POSITIVE muscle aches  Objective  Blood pressure 106/70, pulse 72, height 5\' 10"  (1.778 m), weight 255 lb (115.7 kg), SpO2 97 %.   General: [name] apparent distress alert and oriented x3 mood and affect normal, dressed appropriately.  HEENT: Pupils equal, extraocular movements intact  Respiratory: Patient's speak in full sentences and does not appear short of breath  Cardiovascular: [name] lower extremity edema, non tender, [name] erythema  Gait severely antalgic MSK: Left shoulder exam does have significant decrease in range of motion.  Patient does have crepitus noted.  Mild atrophy noted.  3 out of 5 strength noted. Knee: Left valgus deformity noted. Large thigh to calf ratio.  Tender to palpation over medial and PF joint line.  ROM does have some loss of flexion of 10 degrees. instability with valgus force.  painful patellar compression. Patellar glide with moderate crepitus. Patellar and quadriceps tendons unremarkable. Hamstring and quadriceps strength is normal.  After informed written and verbal consent, patient was seated on exam table. Left knee was prepped with alcohol swab and utilizing anterolateral approach, patient's left knee space was injected with 4:1  marcaine 0.5%: Kenalog 40mg /dL. Patient tolerated the procedure well without immediate complications.  Procedure: Real-time Ultrasound Guided Injection of left glenohumeral joint Device: GE Logiq  E  Ultrasound guided injection is preferred based studies  that show increased duration, increased effect, greater accuracy, decreased procedural pain, increased response rate with ultrasound guided versus blind injection.  Verbal informed consent obtained.  Time-out conducted.  Noted [name] overlying erythema, induration, or other signs of local infection.  Skin prepped in a sterile fashion.  Local anesthesia: Topical Ethyl chloride.  With sterile technique and under real time ultrasound guidance:  Joint visualized.  21g 2 inch needle inserted posterior approach. Pictures taken for needle placement. Patient did have injection of 2 cc of 0.5% Marcaine, and 1cc of Kenalog 40 mg/dL. Completed without difficulty  Pain immediately resolved suggesting accurate placement of the medication.  Advised to call if fevers/chills, erythema, induration, drainage, or persistent bleeding.  Images permanently stored and available for review in the ultrasound unit.  Impression: Technically successful ultrasound guided injection.    Impression and Recommendations:     The above documentation has been reviewed and is accurate and complete Lyndal Pulley, DO

## 2021-03-17 NOTE — Progress Notes (Signed)
Patient ID: [name], male   DOB: [birth year], 65 y.o.   MRN: [id number]        Chief Complaint: follow up HTN, HLD, DM, low vit d       HPI:  [name] is a 65 y.o. male here overall doing ok, taking Vit D 2000 u.  Pt denies chest pain, increased sob or doe, wheezing, orthopnea, PND, increased LE swelling, palpitations, dizziness or syncope.   Pt denies polydipsia, polyuria, or new focal neuro s/s.   Pt denies fever, wt loss, night sweats, loss of appetite, or other constitutional symptoms  No other new complaints.  Due for pneumovax    Taking vit d 2000 u Wt Readings from Last 3 Encounters:  03/17/21 254 lb (115.2 kg)  02/28/21 249 lb (112.9 kg)  01/31/21 252 lb 12.8 oz (114.7 kg)   BP Readings from Last 3 Encounters:  03/17/21 112/70  02/28/21 118/70  01/31/21 122/88         Past Medical History:  Diagnosis Date  . AICD (automatic cardioverter/defibrillator) present   . Anemia    supposed to be taking Vit B but doesn't  . ANXIETY    takes Xanax nightly  . Arthritis   . Asthma    Albuterol prn and Advair daily;also takes Prednisone daily  . Atrial fibrillation ([place]) 09/06/2015  . Cardiomyopathy [facility])    a. EF 25% TEE July 2013; b. EF normalized 2015;  c. 03/2015 Echo: EF 40-45%, difrf HK, PASP 38 mmHg, Mild MR, sev LAE/RAE.  [name] CHF (congestive heart failure) ([place])   . Chronic constipation    takes OTC stool softener  . COPD (chronic obstructive pulmonary disease) ([place])    "one dr says COPD; one dr says emphysema" (09/18/2017)  . DEPRESSION    takes Zoloft and Doxepin daily  . Diverticulitis   . DYSKINESIA, ESOPHAGUS   . Emphysema of lung [facility])    "one dr says COPD; one dr says emphysema" (09/18/2017)  . Essential hypertension       . FIBROMYALGIA   . GERD (gastroesophageal reflux disease)       . Glaucoma   . HYPERLIPIDEMIA    a. Intolerant to statins.  . INSOMNIA    takes Ambien nightly  . Myocardial infarction [facility])    a. 2012 Myoview notable for prior  infarct;  b. 03/2015 Lexiscan CL: EF 37%, diff HK, small area of inferior infarct from apex to base-->Med Rx.  . Myocardial infarction ([place])   . O2 dependent    "2.5L q hs & prn" (09/18/2017)  . Paroxysmal atrial fibrillation (HCC)    a. CHA2DS2VASc = 3--> takes Coumadin;  b. 03/15/2015 Successful TEE/DCCV;  c. 03/2015 recurrent afib, Amio d/c'd in setting of hyperthyroidism.  . Peripheral neuropathy   . Pneumonia 12/2016  . Rash and other nonspecific skin eruption 04/12/2009   no cause found saw dermatologists x 2 and allergist  . SLEEP APNEA, OBSTRUCTIVE    a. doesn't use CPAP  . Syncope    a. 03/2015 s/p MDT LINQ.  [name] Type II diabetes mellitus ([place])        Past Surgical History:  Procedure Laterality Date  . ACNE CYST REMOVAL     2 on back   . AV NODE ABLATION N/A 10/25/2017   Procedure: AV NODE ABLATION;  Surgeon: Deboraha Sprang, MD;  Location: [place] CV LAB;  Service: Cardiovascular;  Laterality: N/A;  . BIV ICD INSERTION CRT-D N/A 09/18/2017  Procedure: BIV ICD INSERTION CRT-D;  Surgeon: Deboraha Sprang, MD;  Location: [place] CV LAB;  Service: Cardiovascular;  Laterality: N/A;  . CARDIAC CATHETERIZATION N/A 03/21/2016   Procedure: Right/Left Heart Cath and Coronary Angiography;  Surgeon: Larey Dresser, MD;  Location: [place] CV LAB;  Service: Cardiovascular;  Laterality: N/A;  . CARDIOVERSION  04/18/2012   Procedure: CARDIOVERSION;  Surgeon: Fay Records, MD;  Location: [place];  Service: Cardiovascular;  Laterality: N/A;  . CARDIOVERSION  04/25/2012   Procedure: CARDIOVERSION;  Surgeon: Thayer Headings, MD;  Location: [place];  Service: Cardiovascular;  Laterality: N/A;  . CARDIOVERSION  04/25/2012   Procedure: CARDIOVERSION;  Surgeon: Fay Records, MD;  Location: [place];  Service: Cardiovascular;  Laterality: N/A;  . CARDIOVERSION  05/09/2012   Procedure: CARDIOVERSION;  Surgeon: Sherren Mocha, MD;  Location: [name];  Service: Cardiovascular;  Laterality: N/A;  changed  from crenshaw to cooper by trish/leone-endo  . CARDIOVERSION N/A 03/15/2015   Procedure: CARDIOVERSION;  Surgeon: Thayer Headings, MD;  Location: [facility] ENDOSCOPY;  Service: Cardiovascular;  Laterality: N/A;  . COLONOSCOPY    . COLONOSCOPY WITH PROPOFOL N/A 10/21/2014   Procedure: COLONOSCOPY WITH PROPOFOL;  Surgeon: Ladene Artist, MD;  Location: [facility] ENDOSCOPY;  Service: Endoscopy;  Laterality: N/A;  . EP IMPLANTABLE DEVICE N/A 04/06/2015   Procedure: Loop Recorder Insertion;  Surgeon: Evans Lance, MD;  Location: [place] CV LAB;  Service: Cardiovascular;  Laterality: N/A;  . ESOPHAGOGASTRODUODENOSCOPY    . JOINT REPLACEMENT    . LOOP RECORDER REMOVAL N/A 09/18/2017   Procedure: LOOP RECORDER REMOVAL;  Surgeon: Deboraha Sprang, MD;  Location: [place] CV LAB;  Service: Cardiovascular;  Laterality: N/A;  . RIGHT/LEFT HEART CATH AND CORONARY ANGIOGRAPHY N/A 01/28/2017   Procedure: Right/Left Heart Cath and Coronary Angiography;  Surgeon: Larey Dresser, MD;  Location: [place] CV LAB;  Service: Cardiovascular;  Laterality: N/A;  . TEE WITHOUT CARDIOVERSION  04/25/2012   Procedure: TRANSESOPHAGEAL ECHOCARDIOGRAM (TEE);  Surgeon: Thayer Headings, MD;  Location: [place];  Service: Cardiovascular;  Laterality: N/A;  . TEE WITHOUT CARDIOVERSION N/A 03/15/2015   Procedure: TRANSESOPHAGEAL ECHOCARDIOGRAM (TEE);  Surgeon: Thayer Headings, MD;  Location: [place];  Service: Cardiovascular;  Laterality: N/A;  . TONSILLECTOMY AND ADENOIDECTOMY    . TOTAL KNEE ARTHROPLASTY Right 06/15/2014   Procedure: TOTAL KNEE ARTHROPLASTY;  Surgeon: Renette Butters, MD;  Location: [place];  Service: Orthopedics;  Laterality: Right;    reports that he quit smoking about 13 years ago. His smoking use included cigarettes. He has a 60.00 pack-year smoking history. He has never used smokeless tobacco. He reports that he does not drink alcohol and does not use drugs. family history includes Allergies in his mother;  Asthma in his maternal grandmother and mother; COPD in his mother; Colon polyps in his mother; Hypothyroidism in his mother. Allergies  Allergen Reactions  . Amiodarone Other (See Comments)    hyperthyroidism  . Statins Other (See Comments)    myalgia  . Tape Other (See Comments)    Skin Tears Use Paper Tape Only   Current Outpatient Medications on File Prior to Visit  Medication Sig Dispense Refill  . acetaminophen (TYLENOL) 500 MG tablet Take 500 mg by mouth every 6 (six) hours as needed.    [name] albuterol (VENTOLIN HFA) 108 (90 Base) MCG/ACT inhaler INHALE 2 PUFFS 4 TIMES A DAY as needed 54 g 3  . ALPRAZolam (XANAX) 0.5 MG  tablet TAKE 2 TABLETS BY MOUTH AT BEDTIME 60 tablet 2  . AMBULATORY NON FORMULARY MEDICATION Rolling walker with a bench 1 Units 0  . augmented betamethasone dipropionate (DIPROLENE-AF) 0.05 % cream Apply topically.    . carisoprodol (SOMA) 350 MG tablet TAKE 1 TABLET BY MOUTH THREE TIMES A DAY AS NEEDED FOR MUSCLE SPASMS 90 tablet 5  . carvedilol (COREG) 12.5 MG tablet Take 1 tablet (12.5 mg total) by mouth 2 (two) times daily with a meal. 180 tablet 3  . dapagliflozin propanediol (FARXIGA) 10 MG TABS tablet Take 1 tablet (10 mg total) by mouth daily before breakfast. 30 tablet 11  . digoxin (LANOXIN) 0.25 MG tablet TAKE 1/2 TABLET BY MOUTH EVERY DAY 45 tablet 3  . doxepin (SINEQUAN) 10 MG capsule Take 2 capsules (20 mg total) by mouth at bedtime as needed. 180 capsule 1  . Dupilumab (DUPIXENT) 300 MG/2ML SOPN Inject 1 Dose into the skin every 14 (fourteen) days.    . Evolocumab (REPATHA SURECLICK) 476 MG/ML SOAJ Inject 1 pen into the skin every 14 (fourteen) days. 6 mL 3  . fluticasone (FLONASE) 50 MCG/ACT nasal spray PLACE 2 SPRAYS INTO BOTH NOSTRILS DAILY AS NEEDED FOR ALLERGIES OR RHINITIS. 48 mL 1  . hydrOXYzine (ATARAX/VISTARIL) 10 MG tablet TAKE 1 TABLET (10 MG TOTAL) BY MOUTH 3 (THREE) TIMES DAILY AS NEEDED FOR ITCHING. 60 tablet 2  . lidocaine (LIDODERM) 5 %  APPLY 1 PATCH TO AFFECTED AREA FOR UP TO 12 HOURS . DO NOT USE MORE THAN ONE PATCH IN 24 HOURS. 90 patch 2  . omeprazole (PRILOSEC) 20 MG capsule TAKE 1 CAPSULE BY MOUTH TWICE A DAY 180 capsule 1  . potassium chloride SA (KLOR-CON) 20 MEQ tablet Take 1 tablet (20 mEq total) by mouth daily. 90 tablet 3  . sacubitril-valsartan (ENTRESTO) 49-51 MG Take 0.5 tablets by mouth 2 (two) times daily. 180 tablet 3  . spironolactone (ALDACTONE) 25 MG tablet TAKE 1 TABLET BY MOUTH EVERY DAY 90 tablet 3  . tamsulosin (FLOMAX) 0.4 MG CAPS capsule     . torsemide (DEMADEX) 20 MG tablet Take 4 tablets (80 mg total) by mouth in the morning AND 3 tablets (60 mg total) every evening. 240 tablet 3  . traMADol (ULTRAM) 50 MG tablet TAKE 1 TABLET (50 MG TOTAL) BY MOUTH DAILY AS NEEDED. 30 tablet 5  . traZODone (DESYREL) 100 MG tablet TAKE 1 TABLET BY MOUTH EVERY DAY AT BEDTIME AS NEEDED 90 tablet 1  . TUMS 500 MG chewable tablet Chew 500-2,000 mg by mouth every 4 (four) hours as needed for indigestion or heartburn.     . valACYclovir (VALTREX) 1000 MG tablet TAKE 1 TABLET BY MOUTH THREE TIMES A DAY 21 tablet 2  . venlafaxine XR (EFFEXOR-XR) 75 MG 24 hr capsule TAKE 1 CAPSULE BY MOUTH DAILY WITH BREAKFAST. 90 capsule 1  . warfarin (COUMADIN) 10 MG tablet Take 10 mg by mouth daily. Mon,wed,fri,sat    . zolpidem (AMBIEN) 10 MG tablet TAKE 1 TABLET BY MOUTH AT BEDTIME AS NEEDED 90 tablet 1  . metolazone (ZAROXOLYN) 2.5 MG tablet Take 1 tablet (2.5 mg total) by mouth as directed. (Patient not taking: Reported on 01/31/2021) 5 tablet 0   No current facility-administered medications on file prior to visit.        ROS:  All others reviewed and negative.  Objective        PE:  BP 112/70 (BP Location: Right Arm, Patient Position: Sitting, Cuff Size:  Large)   Pulse 78   Temp 98.2 F (36.8 C) (Oral)   Ht 5\' 10"  (1.778 m)   Wt 254 lb (115.2 kg)   SpO2 96%   BMI 36.45 kg/m                 Constitutional: Pt appears in  NAD               HENT: Head: NCAT.                Right Ear: External ear normal.                 Left Ear: External ear normal.                Eyes: . Pupils are equal, round, and reactive to light. Conjunctivae and EOM are normal               Nose: without d/c or deformity               Neck: Neck supple. Gross normal ROM               Cardiovascular: Normal rate and regular rhythm.                 Pulmonary/Chest: Effort normal and breath sounds without rales or wheezing.                Abd:  Soft, NT, ND, + BS, no organomegaly               Neurological: Pt is alert. At baseline orientation, motor grossly intact               Skin: Skin is warm. No rashes, no other new lesions, LE edema - trace bilateral               Psychiatric: Pt behavior is normal without agitation   Micro: none  Cardiac tracings I have personally interpreted today:  none  Pertinent Radiological findings (summarize): none   Lab Results  Component Value Date   WBC 9.0 02/28/2021   HGB 14.0 02/28/2021   HCT 40.7 02/28/2021   PLT 210.0 02/28/2021   GLUCOSE 85 02/28/2021   CHOL 153 02/28/2021   TRIG 335.0 (H) 02/28/2021   HDL 33.60 (L) 02/28/2021   LDLDIRECT 72.0 02/28/2021   LDLCALC 162 (H) 10/20/2020   ALT 24 02/28/2021   AST 21 02/28/2021   NA 138 02/28/2021   K 4.2 02/28/2021   CL 103 02/28/2021   CREATININE 1.29 02/28/2021   BUN 30 (H) 02/28/2021   CO2 25 02/28/2021   TSH 1.46 02/28/2021   PSA 2.26 02/28/2021   INR 3.0 02/28/2021   HGBA1C 5.5 02/28/2021   MICROALBUR 0.8 02/28/2021   Assessment/Plan:  [name] is a 65 y.o. White or Caucasian [1] male with  has a past medical history of AICD (automatic cardioverter/defibrillator) present, Anemia, ANXIETY, Arthritis, Asthma, Atrial fibrillation ([place]) (09/06/2015), Cardiomyopathy ([place]), CHF (congestive heart failure) ([place]), Chronic constipation, COPD (chronic obstructive pulmonary disease) ([place]), DEPRESSION, Diverticulitis, DYSKINESIA,  ESOPHAGUS, Emphysema of lung ([place]), Essential hypertension, FIBROMYALGIA, GERD (gastroesophageal reflux disease), Glaucoma, HYPERLIPIDEMIA, INSOMNIA, Myocardial infarction [facility]), Myocardial infarction ([place]), O2 dependent, Paroxysmal atrial fibrillation ([place]), Peripheral neuropathy, Pneumonia (12/2016), Rash and other nonspecific skin eruption (04/12/2009), SLEEP APNEA, OBSTRUCTIVE, Syncope, and Type II diabetes mellitus ([place]).  Nonischemic cardiomyopathy (HCC) Stable volume, cont all current tx - entresto, torsemide, zaroxyln, coreg  Persistent atrial fibrillation (HCC) Stable  rate, continue current med tx - coreg lanoxin  Current Outpatient Medications (Endocrine & Metabolic):  .  dapagliflozin propanediol (FARXIGA) 10 MG TABS tablet, Take 1 tablet (10 mg total) by mouth daily before breakfast.  Current Outpatient Medications (Cardiovascular):  .  carvedilol (COREG) 12.5 MG tablet, Take 1 tablet (12.5 mg total) by mouth 2 (two) times daily with a meal. .  digoxin (LANOXIN) 0.25 MG tablet, TAKE 1/2 TABLET BY MOUTH EVERY DAY .  Evolocumab (REPATHA SURECLICK) 675 MG/ML SOAJ, Inject 1 pen into the skin every 14 (fourteen) days. .  sacubitril-valsartan (ENTRESTO) 49-51 MG, Take 0.5 tablets by mouth 2 (two) times daily. [name]  spironolactone (ALDACTONE) 25 MG tablet, TAKE 1 TABLET BY MOUTH EVERY DAY .  torsemide (DEMADEX) 20 MG tablet, Take 4 tablets (80 mg total) by mouth in the morning AND 3 tablets (60 mg total) every evening. .  metolazone (ZAROXOLYN) 2.5 MG tablet, Take 1 tablet (2.5 mg total) by mouth as directed. (Patient not taking: Reported on 01/31/2021)  Current Outpatient Medications (Respiratory):  .  albuterol (VENTOLIN HFA) 108 (90 Base) MCG/ACT inhaler, INHALE 2 PUFFS 4 TIMES A DAY as needed .  fluticasone (FLONASE) 50 MCG/ACT nasal spray, PLACE 2 SPRAYS INTO BOTH NOSTRILS DAILY AS NEEDED FOR ALLERGIES OR RHINITIS.  Current Outpatient Medications (Analgesics):  .  acetaminophen  (TYLENOL) 500 MG tablet, Take 500 mg by mouth every 6 (six) hours as needed. .  traMADol (ULTRAM) 50 MG tablet, TAKE 1 TABLET (50 MG TOTAL) BY MOUTH DAILY AS NEEDED.  Current Outpatient Medications (Hematological):  .  warfarin (COUMADIN) 10 MG tablet, Take 10 mg by mouth daily. Mon,wed,fri,sat  Current Outpatient Medications (Other):  [name]  ALPRAZolam (XANAX) 0.5 MG tablet, TAKE 2 TABLETS BY MOUTH AT BEDTIME .  AMBULATORY NON FORMULARY MEDICATION, Rolling walker with a bench .  augmented betamethasone dipropionate (DIPROLENE-AF) 0.05 % cream, Apply topically. .  carisoprodol (SOMA) 350 MG tablet, TAKE 1 TABLET BY MOUTH THREE TIMES A DAY AS NEEDED FOR MUSCLE SPASMS .  doxepin (SINEQUAN) 10 MG capsule, Take 2 capsules (20 mg total) by mouth at bedtime as needed. .  Dupilumab (DUPIXENT) 300 MG/2ML SOPN, Inject 1 Dose into the skin every 14 (fourteen) days. .  hydrOXYzine (ATARAX/VISTARIL) 10 MG tablet, TAKE 1 TABLET (10 MG TOTAL) BY MOUTH 3 (THREE) TIMES DAILY AS NEEDED FOR ITCHING. .  lidocaine (LIDODERM) 5 %, APPLY 1 PATCH TO AFFECTED AREA FOR UP TO 12 HOURS . DO NOT USE MORE THAN ONE PATCH IN 24 HOURS. [name]  omeprazole (PRILOSEC) 20 MG capsule, TAKE 1 CAPSULE BY MOUTH TWICE A DAY .  potassium chloride SA (KLOR-CON) 20 MEQ tablet, Take 1 tablet (20 mEq total) by mouth daily. .  tamsulosin (FLOMAX) 0.4 MG CAPS capsule,  .  traZODone (DESYREL) 100 MG tablet, TAKE 1 TABLET BY MOUTH EVERY DAY AT BEDTIME AS NEEDED .  TUMS 500 MG chewable tablet, Chew 500-2,000 mg by mouth every 4 (four) hours as needed for indigestion or heartburn.  .  valACYclovir (VALTREX) 1000 MG tablet, TAKE 1 TABLET BY MOUTH THREE TIMES A DAY .  venlafaxine XR (EFFEXOR-XR) 75 MG 24 hr capsule, TAKE 1 CAPSULE BY MOUTH DAILY WITH BREAKFAST. [name]  zolpidem (AMBIEN) 10 MG tablet, TAKE 1 TABLET BY MOUTH AT BEDTIME AS NEEDED   Essential hypertension BP Readings from Last 3 Encounters:  03/17/21 112/70  02/28/21 118/70  01/31/21 122/88    Stable, pt to continue medical treatment    Type II diabetes  mellitus ([place]) Lab Results  Component Value Date   HGBA1C 5.5 02/28/2021   Stable, pt to continue current medical treatment - farxiga   Hyperlipidemia Lab Results  Component Value Date   LDLCALC 162 (H) 10/20/2020   Stable, pt to start current repatha   Vitamin D deficiency Last vitamin D Lab Results  Component Value Date   VD25OH 24.78 (L) 02/28/2021   Low, to increasee oral replacement to 4000 u  B12 deficiency Lab Results  Component Value Date   VITAMINB12 626 02/28/2021   Stable, cont oral replacement - b12 1000 mcg qd   Followup: Return in about 6 months (around 09/16/2021).  [name], MD 03/18/2021 4:32 PM [facility]

## 2021-03-17 NOTE — Patient Instructions (Signed)
You had the pneumovax pneumonia shot today  Please continue all other medications as before, including the Vitamin D at 4000 u per day  Please have the pharmacy call with any other refills you may need.  Please continue your efforts at being more active, low cholesterol diet, and weight control.  Please keep your appointments with your specialists as you may have planned  Please make an Appointment to return in 6 months, or sooner if needed

## 2021-03-18 ENCOUNTER — Encounter: Payer: Self-pay | Admitting: Internal Medicine

## 2021-03-18 MED ORDER — CHOLECALCIFEROL 100 MCG (4000 UT) PO CAPS: ORAL_CAPSULE | ORAL | 99 refills | Status: DC

## 2021-03-18 NOTE — Assessment & Plan Note (Signed)
Stable rate, continue current med tx - coreg lanoxin  Current Outpatient Medications (Endocrine & Metabolic):  .  dapagliflozin propanediol (FARXIGA) 10 MG TABS tablet, Take 1 tablet (10 mg total) by mouth daily before breakfast.  Current Outpatient Medications (Cardiovascular):  .  carvedilol (COREG) 12.5 MG tablet, Take 1 tablet (12.5 mg total) by mouth 2 (two) times daily with a meal. .  digoxin (LANOXIN) 0.25 MG tablet, TAKE 1/2 TABLET BY MOUTH EVERY DAY .  Evolocumab (REPATHA SURECLICK) 161 MG/ML SOAJ, Inject 1 pen into the skin every 14 (fourteen) days. .  sacubitril-valsartan (ENTRESTO) 49-51 MG, Take 0.5 tablets by mouth 2 (two) times daily. [name]  spironolactone (ALDACTONE) 25 MG tablet, TAKE 1 TABLET BY MOUTH EVERY DAY .  torsemide (DEMADEX) 20 MG tablet, Take 4 tablets (80 mg total) by mouth in the morning AND 3 tablets (60 mg total) every evening. .  metolazone (ZAROXOLYN) 2.5 MG tablet, Take 1 tablet (2.5 mg total) by mouth as directed. (Patient not taking: Reported on 01/31/2021)  Current Outpatient Medications (Respiratory):  .  albuterol (VENTOLIN HFA) 108 (90 Base) MCG/ACT inhaler, INHALE 2 PUFFS 4 TIMES A DAY as needed .  fluticasone (FLONASE) 50 MCG/ACT nasal spray, PLACE 2 SPRAYS INTO BOTH NOSTRILS DAILY AS NEEDED FOR ALLERGIES OR RHINITIS.  Current Outpatient Medications (Analgesics):  .  acetaminophen (TYLENOL) 500 MG tablet, Take 500 mg by mouth every 6 (six) hours as needed. .  traMADol (ULTRAM) 50 MG tablet, TAKE 1 TABLET (50 MG TOTAL) BY MOUTH DAILY AS NEEDED.  Current Outpatient Medications (Hematological):  .  warfarin (COUMADIN) 10 MG tablet, Take 10 mg by mouth daily. Mon,wed,fri,sat  Current Outpatient Medications (Other):  [name]  ALPRAZolam (XANAX) 0.5 MG tablet, TAKE 2 TABLETS BY MOUTH AT BEDTIME .  AMBULATORY NON FORMULARY MEDICATION, Rolling walker with a bench .  augmented betamethasone dipropionate (DIPROLENE-AF) 0.05 % cream, Apply topically. .  carisoprodol  (SOMA) 350 MG tablet, TAKE 1 TABLET BY MOUTH THREE TIMES A DAY AS NEEDED FOR MUSCLE SPASMS .  doxepin (SINEQUAN) 10 MG capsule, Take 2 capsules (20 mg total) by mouth at bedtime as needed. .  Dupilumab (DUPIXENT) 300 MG/2ML SOPN, Inject 1 Dose into the skin every 14 (fourteen) days. .  hydrOXYzine (ATARAX/VISTARIL) 10 MG tablet, TAKE 1 TABLET (10 MG TOTAL) BY MOUTH 3 (THREE) TIMES DAILY AS NEEDED FOR ITCHING. .  lidocaine (LIDODERM) 5 %, APPLY 1 PATCH TO AFFECTED AREA FOR UP TO 12 HOURS . DO NOT USE MORE THAN ONE PATCH IN 24 HOURS. [name]  omeprazole (PRILOSEC) 20 MG capsule, TAKE 1 CAPSULE BY MOUTH TWICE A DAY .  potassium chloride SA (KLOR-CON) 20 MEQ tablet, Take 1 tablet (20 mEq total) by mouth daily. .  tamsulosin (FLOMAX) 0.4 MG CAPS capsule,  .  traZODone (DESYREL) 100 MG tablet, TAKE 1 TABLET BY MOUTH EVERY DAY AT BEDTIME AS NEEDED .  TUMS 500 MG chewable tablet, Chew 500-2,000 mg by mouth every 4 (four) hours as needed for indigestion or heartburn.  .  valACYclovir (VALTREX) 1000 MG tablet, TAKE 1 TABLET BY MOUTH THREE TIMES A DAY .  venlafaxine XR (EFFEXOR-XR) 75 MG 24 hr capsule, TAKE 1 CAPSULE BY MOUTH DAILY WITH BREAKFAST. [name]  zolpidem (AMBIEN) 10 MG tablet, TAKE 1 TABLET BY MOUTH AT BEDTIME AS NEEDED

## 2021-03-18 NOTE — Assessment & Plan Note (Signed)
Lab Results  Component Value Date   HGBA1C 5.5 02/28/2021   Stable, pt to continue current medical treatment - farxiga

## 2021-03-18 NOTE — Assessment & Plan Note (Addendum)
Stable volume, cont all current tx - entresto, torsemide, zaroxyln, coreg

## 2021-03-18 NOTE — Assessment & Plan Note (Addendum)
Lab Results  Component Value Date   LDLCALC 162 (H) 10/20/2020   Stable, pt to start current repatha

## 2021-03-18 NOTE — Assessment & Plan Note (Signed)
BP Readings from Last 3 Encounters:  03/17/21 112/70  02/28/21 118/70  01/31/21 122/88   Stable, pt to continue medical treatment

## 2021-03-18 NOTE — Assessment & Plan Note (Signed)
Lab Results  Component Value Date   [id number] 791 02/28/2021   Stable, cont oral replacement - b12 1000 mcg qd

## 2021-03-18 NOTE — Assessment & Plan Note (Addendum)
Last vitamin D Lab Results  Component Value Date   VD25OH 24.78 (L) 02/28/2021   Low, to increasee oral replacement to 4000 u

## 2021-03-20 ENCOUNTER — Other Ambulatory Visit: Payer: Self-pay | Admitting: Internal Medicine

## 2021-03-20 ENCOUNTER — Encounter (HOSPITAL_BASED_OUTPATIENT_CLINIC_OR_DEPARTMENT_OTHER): Payer: Medicare HMO | Attending: Internal Medicine | Admitting: Internal Medicine

## 2021-03-20 ENCOUNTER — Other Ambulatory Visit: Payer: Self-pay

## 2021-03-20 DIAGNOSIS — L97829 Non-pressure chronic ulcer of other part of left lower leg with unspecified severity: Secondary | ICD-10-CM | POA: Diagnosis not present

## 2021-03-20 DIAGNOSIS — I872 Venous insufficiency (chronic) (peripheral): Secondary | ICD-10-CM | POA: Insufficient documentation

## 2021-03-20 DIAGNOSIS — Z87891 Personal history of nicotine dependence: Secondary | ICD-10-CM | POA: Diagnosis not present

## 2021-03-20 DIAGNOSIS — I5022 Chronic systolic (congestive) heart failure: Secondary | ICD-10-CM | POA: Insufficient documentation

## 2021-03-20 DIAGNOSIS — E11622 Type 2 diabetes mellitus with other skin ulcer: Secondary | ICD-10-CM | POA: Insufficient documentation

## 2021-03-20 DIAGNOSIS — Z86718 Personal history of other venous thrombosis and embolism: Secondary | ICD-10-CM | POA: Diagnosis not present

## 2021-03-20 DIAGNOSIS — I4819 Other persistent atrial fibrillation: Secondary | ICD-10-CM | POA: Diagnosis not present

## 2021-03-20 DIAGNOSIS — I110 Hypertensive heart disease with heart failure: Secondary | ICD-10-CM | POA: Diagnosis not present

## 2021-03-20 DIAGNOSIS — L97929 Non-pressure chronic ulcer of unspecified part of left lower leg with unspecified severity: Secondary | ICD-10-CM | POA: Insufficient documentation

## 2021-03-21 ENCOUNTER — Ambulatory Visit (INDEPENDENT_AMBULATORY_CARE_PROVIDER_SITE_OTHER): Payer: Medicare HMO | Admitting: Podiatry

## 2021-03-21 ENCOUNTER — Ambulatory Visit (INDEPENDENT_AMBULATORY_CARE_PROVIDER_SITE_OTHER): Payer: Medicare HMO | Admitting: Family Medicine

## 2021-03-21 ENCOUNTER — Encounter: Payer: Self-pay | Admitting: Family Medicine

## 2021-03-21 ENCOUNTER — Ambulatory Visit: Payer: Self-pay

## 2021-03-21 VITALS — BP 106/70 | HR 72 | Ht 70.0 in | Wt 255.0 lb

## 2021-03-21 DIAGNOSIS — M79674 Pain in right toe(s): Secondary | ICD-10-CM

## 2021-03-21 DIAGNOSIS — L600 Ingrowing nail: Secondary | ICD-10-CM

## 2021-03-21 DIAGNOSIS — M2041 Other hammer toe(s) (acquired), right foot: Secondary | ICD-10-CM | POA: Diagnosis not present

## 2021-03-21 DIAGNOSIS — M25512 Pain in left shoulder: Secondary | ICD-10-CM | POA: Diagnosis not present

## 2021-03-21 DIAGNOSIS — B351 Tinea unguium: Secondary | ICD-10-CM

## 2021-03-21 DIAGNOSIS — G8929 Other chronic pain: Secondary | ICD-10-CM | POA: Diagnosis not present

## 2021-03-21 DIAGNOSIS — M2011 Hallux valgus (acquired), right foot: Secondary | ICD-10-CM

## 2021-03-21 DIAGNOSIS — M1712 Unilateral primary osteoarthritis, left knee: Secondary | ICD-10-CM

## 2021-03-21 DIAGNOSIS — M79675 Pain in left toe(s): Secondary | ICD-10-CM | POA: Diagnosis not present

## 2021-03-21 DIAGNOSIS — M12812 Other specific arthropathies, not elsewhere classified, left shoulder: Secondary | ICD-10-CM

## 2021-03-21 DIAGNOSIS — M21611 Bunion of right foot: Secondary | ICD-10-CM | POA: Diagnosis not present

## 2021-03-21 DIAGNOSIS — M12811 Other specific arthropathies, not elsewhere classified, right shoulder: Secondary | ICD-10-CM | POA: Diagnosis not present

## 2021-03-21 NOTE — Assessment & Plan Note (Signed)
Only left-sided injected today.  Tolerated the procedure well.  Discussed icing regimen and home exercises.  Discussed which activities to do which wants to avoid.  Increase activity slowly.  Follow-up with me again in 8 to 10 weeks.  Patient would be a candidate potentially for a replacement but at the moment needs his left knee replaced first.

## 2021-03-21 NOTE — Chronic Care Management (AMB) (Signed)
  Chronic Care Management   Note  03/21/2021 Name: [name] MRN: [id number] DOB: September 29, 1956  [name] is a 65 y.o. year old male who is a primary care patient of [name], MD. I reached out to [name] by phone today in response to a referral sent by [name] PCP,John, Hunt Oris, MD .     [name] was given information about Chronic Care Management services today including:  1. CCM service includes personalized support from designated clinical staff supervised by his physician, including individualized plan of care and coordination with other care providers 2. 24/7 contact phone numbers for assistance for urgent and routine care needs. 3. Service will only be billed when office clinical staff spend 20 minutes or more in a month to coordinate care. 4. Only one practitioner may furnish and bill the service in a calendar month. 5. The patient may stop CCM services at any time (effective at the end of the month) by phone call to the office staff. 6. The patient will be responsible for cost sharing (co-pay) of up to 20% of the service fee (after annual deductible is met).  Patient agreed to services and verbal consent obtained.   Follow up plan: Telephone appointment with care management team member scheduled for:04/18/2021  Noreene Larsson, [place], [place], [place] [address] Direct Dial: [phone] [name] Website: [email or web address]

## 2021-03-21 NOTE — Assessment & Plan Note (Signed)
Repeat injection given again today.  Tolerated the procedure well.  Discussed icing regimen and home exercises, discussed avoiding certain activities.  Follow-up with me again 8 to 10 weeks patient has been referred for potential surgical intervention.  Patient primary care is giving most of the clearance but does need clearance from cardiology.

## 2021-03-21 NOTE — Patient Instructions (Signed)
See me in 8-10 weeks 

## 2021-03-22 NOTE — Progress Notes (Signed)
DARONTE, SHOSTAK ([id number]) Visit Report for 03/20/2021 Chief Complaint Document Details Patient Name: Date of Service: [name] RD L. 03/20/2021 12:30 PM Medical Record Number: [id number] Patient Account Number: [id number] Date of Birth/Sex: Treating RN: 12-Aug-1956 (65 y.o. [name] Primary Care Provider: [name] Other Clinician: Referring Provider: Treating Provider/Extender: [name] in Treatment: 5 Information Obtained from: Patient Chief Complaint Left lower extremity wound Electronic Signature(s) Signed: 03/20/2021 1:00:16 PM By: [name] Entered By: Kalman Shan on 03/20/2021 12:54:14 -------------------------------------------------------------------------------- HPI Details Patient Name: Date of Service: [name], [name]. 03/20/2021 12:30 PM Medical Record Number: [id number] Patient Account Number: [id number] Date of Birth/Sex: Treating RN: March 21, 1956 (65 y.o. [name] Primary Care Provider: [name] Other Clinician: Referring Provider: Treating Provider/Extender: [name] in Treatment: 5 History of Present Illness HPI Description: Admission 5/2 [name] is a 65 year old male with a past medical history of systolic congestive heart failure, COPD, nonischemic cardiomyopathy, type 2 diabetes, chronic venous insufficiency that presents to the clinic for a 65-month history of nonhealing wound to his left lower extremity. He states that in November 2021 he fell off his porch and Hit his leg against a brick. The wound has healed mostly except for 1 area that is still open. He reports moderate Serosanguineous drainage daily. He has been keeping it covered with Band-Aids. He reports 2 rounds of antibiotics for this issue. He denies pain. He denies current acute signs of infection. 5/9; patient presents for 1 week follow-up. He has been using Hydrofera Blue under 3 layer compression. He has no  issues or complaints today. He denies signs of infection. 5/16; patient presents for 1 week follow-up. He has been using Hydrofera Blue under 3 layer compression. He has no issues or complaints today. He denies signs of infection. He states he overall feels well 5/23; patient presents for 1 week follow-up. He has been using Hydrofera Blue under 3 layer compression. He has no issues or complaints today. He denies signs of infection. 6/6; patient presents for 2-week follow-up. He has been using Hydrofera Blue under 3 layer compression. He has had no issues with this. He denies any signs of infection and has no complaints today. Electronic Signature(s) Signed: 03/20/2021 1:00:16 PM By: [name] Entered By: Kalman Shan on 03/20/2021 12:54:48 -------------------------------------------------------------------------------- Physical Exam Details Patient Name: Date of Service: [name] RD L. 03/20/2021 12:30 PM Medical Record Number: [id number] Patient Account Number: [id number] Date of Birth/Sex: Treating RN: 1956-09-27 (65 y.o. [name] Primary Care Provider: [name] Other Clinician: Referring Provider: Treating Provider/Extender: [name] in Treatment: 5 Constitutional respirations regular, non-labored and within target range for patient.. Cardiovascular 2+ dorsalis pedis/posterior tibialis pulses. Psychiatric pleasant and cooperative. Notes Left lower extremity: Epithelialization over the previous wound site to the medial aspect. Electronic Signature(s) Signed: 03/20/2021 1:00:16 PM By: [name] Entered By: Kalman Shan on 03/20/2021 12:55:35 -------------------------------------------------------------------------------- Physician Orders Details Patient Name: Date of Service: [name], [place] 03/20/2021 12:30 PM Medical Record Number: [id number] Patient Account Number: [id number] Date of Birth/Sex: Treating  RN: June 17, 1956 (65 y.o. [name] Primary Care Provider: [name] Other Clinician: Referring Provider: Treating Provider/Extender: [name] in Treatment: 5 Verbal / Phone Orders: No Diagnosis Coding ICD-10 Coding Code Description I87.2 Venous insufficiency (chronic) (peripheral) L97.829 Non-pressure chronic ulcer of other part of left lower leg with unspecified severity I10 Essential (primary) hypertension I42.8  Other cardiomyopathies J44.9 Chronic obstructive pulmonary disease, unspecified I48.19 Other persistent atrial fibrillation E11.9 Type 2 diabetes mellitus without complications J00.93 Chronic systolic (congestive) heart failure Discharge From [facility] Discharge from [place] - Wound healed!! Edema Control - Lymphedema / SCD / Other Bilateral Lower Extremities Elevate legs to the level of the heart or above for 30 minutes daily and/or when sitting, a frequency of: - throughout the day Avoid standing for long periods of time. Exercise regularly Moisturize legs daily. Compression stocking or Garment 20-30 mm/Hg pressure to: - both legs daily Electronic Signature(s) Signed: 03/20/2021 1:00:16 PM By: [name] Entered By: Kalman Shan on 03/20/2021 12:55:51 -------------------------------------------------------------------------------- Problem List Details Patient Name: Date of Service: [name] RD L. 03/20/2021 12:30 PM Medical Record Number: [id number] Patient Account Number: [id number] Date of Birth/Sex: Treating RN: Jan 28, 1956 (65 y.o. [name] Primary Care Provider: [name] Other Clinician: Referring Provider: Treating Provider/Extender: [name] in Treatment: 5 Active Problems ICD-10 Encounter Code Description Active Date MDM Diagnosis I87.2 Venous insufficiency (chronic) (peripheral) 02/13/2021 No Yes L97.829 Non-pressure chronic ulcer of other part of left  lower leg with unspecified 02/13/2021 No Yes severity I10 Essential (primary) hypertension 02/13/2021 No Yes I42.8 Other cardiomyopathies 02/13/2021 No Yes J44.9 Chronic obstructive pulmonary disease, unspecified 02/13/2021 No Yes I48.19 Other persistent atrial fibrillation 02/13/2021 No Yes E11.9 Type 2 diabetes mellitus without complications 03/23/6788 No Yes F81.01 Chronic systolic (congestive) heart failure 02/13/2021 No Yes Inactive Problems Resolved Problems Electronic Signature(s) Signed: 03/20/2021 1:00:16 PM By: [name] Entered By: Kalman Shan on 03/20/2021 12:53:18 -------------------------------------------------------------------------------- Progress Note Details Patient Name: Date of Service: [name] RD L. 03/20/2021 12:30 PM Medical Record Number: [id number] Patient Account Number: [id number] Date of Birth/Sex: Treating RN: Feb 11, 1956 (65 y.o. [name] Primary Care Provider: [name] Other Clinician: Referring Provider: Treating Provider/Extender: [name] in Treatment: 5 Subjective Chief Complaint Information obtained from Patient Left lower extremity wound History of Present Illness (HPI) Admission 5/2 Mr. Capozzi is a 65 year old male with a past medical history of systolic congestive heart failure, COPD, nonischemic cardiomyopathy, type 2 diabetes, chronic venous insufficiency that presents to the clinic for a 41-month history of nonhealing wound to his left lower extremity. He states that in November 2021 he fell off his porch and Hit his leg against a brick. The wound has healed mostly except for 1 area that is still open. He reports moderate Serosanguineous drainage daily. He has been keeping it covered with Band-Aids. He reports 2 rounds of antibiotics for this issue. He denies pain. He denies current acute signs of infection. 5/9; patient presents for 1 week follow-up. He has been using Hydrofera Blue under 3 layer  compression. He has no issues or complaints today. He denies signs of infection. 5/16; patient presents for 1 week follow-up. He has been using Hydrofera Blue under 3 layer compression. He has no issues or complaints today. He denies signs of infection. He states he overall feels well 5/23; patient presents for 1 week follow-up. He has been using Hydrofera Blue under 3 layer compression. He has no issues or complaints today. He denies signs of infection. 6/6; patient presents for 2-week follow-up. He has been using Hydrofera Blue under 3 layer compression. He has had no issues with this. He denies any signs of infection and has no complaints today. Patient History Information obtained from Patient. Family History Heart Disease - Father, Hypertension - Mother,Father,Siblings, Lung Disease -  Mother, Thyroid Problems - Mother, No family history of Cancer, Diabetes, Hereditary Spherocytosis, Kidney Disease, Seizures, Stroke, Tuberculosis. Social History Former smoker - quit 2009, Marital Status - Divorced, Alcohol Use - Never, Drug Use - No History, Caffeine Use - Daily - coffee, diet soda. Medical History Eyes Patient has history of Cataracts Denies history of Glaucoma, Optic Neuritis Hematologic/Lymphatic Patient has history of Anemia Respiratory Patient has history of Chronic Obstructive Pulmonary Disease (COPD), Sleep Apnea - sleeps in recliner Denies history of Asthma, Pneumothorax Cardiovascular Patient has history of Arrhythmia - afib, Congestive Heart Failure, Deep Vein Thrombosis, Myocardial Infarction, Peripheral Venous Disease Endocrine Patient has history of Type II Diabetes Denies history of Type I Diabetes Genitourinary Denies history of End Stage Renal Disease Integumentary (Skin) Denies history of History of Burn Musculoskeletal Patient has history of Osteoarthritis Oncologic Denies history of Received Chemotherapy, Received Radiation Psychiatric Denies history of  Anorexia/bulimia, Confinement Anxiety Medical A Surgical History Notes nd Constitutional Symptoms (General Health) obesity Cardiovascular cardiomyopathy, pacemaker/defibulator Gastrointestinal GERD Endocrine thyrotoxicosis Genitourinary hx kidney stones, BPH Musculoskeletal thoracic and lumbosacral spondylosis Objective Constitutional respirations regular, non-labored and within target range for patient.. Vitals Time Taken: 12:37 PM, Height: 70 in, Weight: 260 lbs, BMI: 37.3, Temperature: 98.1 F, Pulse: 76 bpm, Respiratory Rate: 20 breaths/min, Blood Pressure: 101/65 mmHg. Cardiovascular 2+ dorsalis pedis/posterior tibialis pulses. Psychiatric pleasant and cooperative. General Notes: Left lower extremity: Epithelialization over the previous wound site to the medial aspect. Integumentary (Hair, Skin) Wound #1 status is Open. Original cause of wound was Trauma. The date acquired was: 09/06/2020. The wound has been in treatment 5 weeks. The wound is located on the Left,Medial Lower Leg. The wound measures 0cm length x 0cm width x 0cm depth; 0cm^2 area and 0cm^3 volume. There is no tunneling or undermining noted. There is a none present amount of drainage noted. There is no granulation within the wound bed. There is no necrotic tissue within the wound bed. Assessment Active Problems ICD-10 Venous insufficiency (chronic) (peripheral) Non-pressure chronic ulcer of other part of left lower leg with unspecified severity Essential (primary) hypertension Other cardiomyopathies Chronic obstructive pulmonary disease, unspecified Other persistent atrial fibrillation Type 2 diabetes mellitus without complications Chronic systolic (congestive) heart failure Patient presents with a closed wound. He has done very well with compression therapy and Hydrofera Blue. I recommended he keep the area covered and padded as it still weak epithelialized tissue. He has compression stockings but they  are at home. I recommended he put these on daily. He knows to follow- up as needed And call with any questions or concerns. Plan Discharge From [facility] Services: Discharge from [place] healed!! Edema Control - Lymphedema / SCD / Other: Elevate legs to the level of the heart or above for 30 minutes daily and/or when sitting, a frequency of: - throughout the day Avoid standing for long periods of time. Exercise regularly Moisturize legs daily. Compression stocking or Garment 20-30 mm/Hg pressure to: - both legs daily 1. Keep area protected for 1 to 2 weeks with a Band-Aid 2. Compression stockings 3. Follow-up as needed, discharged from our office due to closed wound. Electronic Signature(s) Signed: 03/20/2021 1:00:16 PM By: [name] Entered By: Kalman Shan on 03/20/2021 12:58:22 -------------------------------------------------------------------------------- HxROS Details Patient Name: Date of Service: [name], [name] RD L. 03/20/2021 12:30 PM Medical Record Number: [id number] Patient Account Number: [id number] Date of Birth/Sex: Treating RN: [birth year] (65 y.o. [name] Primary Care Provider: [name] Other Clinician: Referring Provider:  Treating Provider/Extender: [name] in Treatment: 5 Information Obtained From Patient Constitutional Symptoms (General Health) Medical History: Past Medical History Notes: obesity Eyes Medical History: Positive for: Cataracts Negative for: Glaucoma; Optic Neuritis Hematologic/Lymphatic Medical History: Positive for: Anemia Respiratory Medical History: Positive for: Chronic Obstructive Pulmonary Disease (COPD); Sleep Apnea - sleeps in recliner Negative for: Asthma; Pneumothorax Cardiovascular Medical History: Positive for: Arrhythmia - afib; Congestive Heart Failure; Deep Vein Thrombosis; Myocardial Infarction; Peripheral Venous Disease Past Medical History  Notes: cardiomyopathy, pacemaker/defibulator Gastrointestinal Medical History: Past Medical History Notes: GERD Endocrine Medical History: Positive for: Type II Diabetes Negative for: Type I Diabetes Past Medical History Notes: thyrotoxicosis Time with diabetes: 10 yrs Treated with: Diet Blood sugar tested every day: No Genitourinary Medical History: Negative for: End Stage Renal Disease Past Medical History Notes: hx kidney stones, BPH Integumentary (Skin) Medical History: Negative for: History of Burn Musculoskeletal Medical History: Positive for: Osteoarthritis Past Medical History Notes: thoracic and lumbosacral spondylosis Oncologic Medical History: Negative for: Received Chemotherapy; Received Radiation Psychiatric Medical History: Negative for: Anorexia/bulimia; Confinement Anxiety HBO Extended History Items Eyes: Cataracts Immunizations Pneumococcal Vaccine: Received Pneumococcal Vaccination: Yes Implantable Devices Yes Family and Social History Cancer: No; Diabetes: No; Heart Disease: Yes - Father; Hereditary Spherocytosis: No; Hypertension: Yes - Mother,Father,Siblings; Kidney Disease: No; Lung Disease: Yes - Mother; Seizures: No; Stroke: No; Thyroid Problems: Yes - Mother; Tuberculosis: No; Former smoker - quit 2009; Marital Status - Divorced; Alcohol Use: Never; Drug Use: No History; Caffeine Use: Daily - coffee, diet soda; Financial Concerns: No; Food, Clothing or Shelter Needs: No; Support System Lacking: No; Transportation Concerns: No Electronic Signature(s) Signed: 03/20/2021 1:00:16 PM By: [name] Signed: 03/22/2021 5:57:05 PM By: Levan Hurst RN, BSN Entered By: Kalman Shan on 03/20/2021 12:54:57 -------------------------------------------------------------------------------- [place] Details Patient Name: Date of Service: [name] RD L. 03/20/2021 Medical Record Number: [id number] Patient Account Number: [id number] Date of  Birth/Sex: Treating RN: December 27, 1955 (65 y.o. [name] Primary Care Provider: [name] Other Clinician: Referring Provider: Treating Provider/Extender: [name] in Treatment: 5 Diagnosis Coding ICD-10 Codes Code Description I87.2 Venous insufficiency (chronic) (peripheral) L97.829 Non-pressure chronic ulcer of other part of left lower leg with unspecified severity I10 Essential (primary) hypertension I42.8 Other cardiomyopathies J44.9 Chronic obstructive pulmonary disease, unspecified I48.19 Other persistent atrial fibrillation E11.9 Type 2 diabetes mellitus without complications W09.81 Chronic systolic (congestive) heart failure Facility Procedures CPT4 Code: [id number] Description: 99213 - WOUND CARE VISIT-LEV 3 EST PT Modifier: Quantity: 1 Physician Procedures : CPT4 Code Description Modifier [id number] - WC PHYS LEVEL 3 - EST PT ICD-10 Diagnosis Description I87.2 Venous insufficiency (chronic) (peripheral) L97.829 Non-pressure chronic ulcer of other part of left lower leg with unspecified severity Quantity: 1 Electronic Signature(s) Signed: 03/20/2021 1:00:16 PM By: [name] Entered By: Kalman Shan on 03/20/2021 12:58:48

## 2021-03-22 NOTE — Progress Notes (Signed)
[name], [name] ([id number]) Visit Report for 03/20/2021 Arrival Information Details Patient Name: Date of Service: [name] RD L. 03/20/2021 12:30 PM Medical Record Number: [id number] Patient Account Number: [id number] Date of Birth/Sex: Treating RN: June 12, 1956 (65 y.o. [name] Primary Care Wataru Mccowen: [name] Other Clinician: Referring Shareef Eddinger: Treating Dezhane Staten/Extender: [name] in Treatment: 5 Visit Information History Since Last Visit Added or deleted any medications: No Patient Arrived: [name] Any new allergies or adverse reactions: No Arrival Time: 12:37 Had a fall or experienced change in No Accompanied By: self activities of daily living that may affect Transfer Assistance: None risk of falls: Patient Identification Verified: Yes Signs or symptoms of abuse/neglect since last visito No Secondary Verification Process Completed: Yes Hospitalized since last visit: No Patient Requires Transmission-Based Precautions: No Implantable device outside of the clinic excluding No Patient Has Alerts: Yes cellular tissue based products placed in the center Patient Alerts: R ABI= 1.33, L ABI=1.16 since last visit: Has Dressing in Place as Prescribed: Yes Pain Present Now: No Electronic Signature(s) Signed: 03/20/2021 2:54:57 PM By: Sandre Kitty Entered By: Sandre Kitty on 03/20/2021 12:37:29 -------------------------------------------------------------------------------- Clinic Level of Care Assessment Details Patient Name: Date of Service: [name] RD L. 03/20/2021 12:30 PM Medical Record Number: [id number] Patient Account Number: [id number] Date of Birth/Sex: Treating RN: Dec 31, 1955 (65 y.o. [name] Primary Care Siri Buege: [name] Other Clinician: Referring Kessa Fairbairn: Treating Idaly Verret/Extender: [name] in Treatment: 5 Clinic Level of Care Assessment Items TOOL 4 Quantity Score X- 1 0 Use  when only an EandM is performed on FOLLOW-UP visit ASSESSMENTS - Nursing Assessment / Reassessment X- 1 10 Reassessment of Co-morbidities (includes updates in patient status) X- 1 5 Reassessment of Adherence to Treatment Plan ASSESSMENTS - Wound and Skin A ssessment / Reassessment X - Simple Wound Assessment / Reassessment - one wound 1 5 []  - 0 Complex Wound Assessment / Reassessment - multiple wounds []  - 0 Dermatologic / Skin Assessment (not related to wound area) ASSESSMENTS - Focused Assessment []  - 0 Circumferential Edema Measurements - multi extremities []  - 0 Nutritional Assessment / Counseling / Intervention X- 1 5 Lower Extremity Assessment (monofilament, tuning fork, pulses) []  - 0 Peripheral Arterial Disease Assessment (using hand held doppler) ASSESSMENTS - Ostomy and/or Continence Assessment and Care []  - 0 Incontinence Assessment and Management []  - 0 Ostomy Care Assessment and Management (repouching, etc.) PROCESS - Coordination of Care X - Simple Patient / Family Education for ongoing care 1 15 []  - 0 Complex (extensive) Patient / Family Education for ongoing care X- 1 10 Staff obtains Programmer, systems, Records, T Results / Process Orders est []  - 0 Staff telephones HHA, Nursing Homes / Clarify orders / etc []  - 0 Routine Transfer to another Facility (non-emergent condition) []  - 0 Routine Hospital Admission (non-emergent condition) []  - 0 New Admissions / Biomedical engineer / Ordering NPWT Apligraf, etc. , []  - 0 Emergency Hospital Admission (emergent condition) X- 1 10 Simple Discharge Coordination []  - 0 Complex (extensive) Discharge Coordination PROCESS - Special Needs []  - 0 Pediatric / Minor Patient Management []  - 0 Isolation Patient Management []  - 0 Hearing / Language / Visual special needs []  - 0 Assessment of Community assistance (transportation, D/C planning, etc.) []  - 0 Additional assistance / Altered mentation []  - 0 Support  Surface(s) Assessment (bed, cushion, seat, etc.) INTERVENTIONS - Wound Cleansing / Measurement X - Simple Wound Cleansing - one wound 1 5 []  -  0 Complex Wound Cleansing - multiple wounds X- 1 5 Wound Imaging (photographs - any number of wounds) []  - 0 Wound Tracing (instead of photographs) X- 1 5 Simple Wound Measurement - one wound []  - 0 Complex Wound Measurement - multiple wounds INTERVENTIONS - Wound Dressings []  - 0 Small Wound Dressing one or multiple wounds []  - 0 Medium Wound Dressing one or multiple wounds []  - 0 Large Wound Dressing one or multiple wounds []  - 0 Application of Medications - topical []  - 0 Application of Medications - injection INTERVENTIONS - Miscellaneous []  - 0 External ear exam []  - 0 Specimen Collection (cultures, biopsies, blood, body fluids, etc.) []  - 0 Specimen(s) / Culture(s) sent or taken to Lab for analysis []  - 0 Patient Transfer (multiple staff / Civil Service fast streamer / Similar devices) []  - 0 Simple Staple / Suture removal (25 or less) []  - 0 Complex Staple / Suture removal (26 or more) []  - 0 Hypo / Hyperglycemic Management (close monitor of Blood Glucose) []  - 0 Ankle / Brachial Index (ABI) - do not check if billed separately X- 1 5 Vital Signs Has the patient been seen at the hospital within the last three years: Yes Total Score: 80 Level Of Care: New/Established - Level 3 Electronic Signature(s) Signed: 03/22/2021 5:57:05 PM By: Levan Hurst RN, BSN Entered By: Levan Hurst on 03/20/2021 12:50:24 -------------------------------------------------------------------------------- Encounter Discharge Information Details Patient Name: Date of Service: [name], [name] RD L. 03/20/2021 12:30 PM Medical Record Number: [id number] Patient Account Number: [id number] Date of Birth/Sex: Treating RN: July 27, 1956 (65 y.o. [name] Primary Care Brelan Hannen: [name] Other Clinician: Referring Jayan Raymundo: Treating Ellyce Lafevers/Extender: [name] in Treatment: 5 Encounter Discharge Information Items Discharge Condition: Stable Ambulatory Status: Ambulatory Discharge Destination: Home Transportation: Private Auto Accompanied By: alone Schedule Follow-up Appointment: Yes Clinical Summary of Care: Patient Declined Electronic Signature(s) Signed: 03/22/2021 5:57:05 PM By: Levan Hurst RN, BSN Entered By: Levan Hurst on 03/20/2021 12:51:42 -------------------------------------------------------------------------------- Lower Extremity Assessment Details Patient Name: Date of Service: [name] RD L. 03/20/2021 12:30 PM Medical Record Number: [id number] Patient Account Number: [id number] Date of Birth/Sex: Treating RN: Mar 03, 1956 (65 y.o. [name] Primary Care Zeferino Mounts: [name] Other Clinician: Referring Bodie Abernethy: Treating Marybella Ethier/Extender: [name] in Treatment: 5 Edema Assessment Assessed: [name]: No] [Right: No] Edema: [Left: Ye] [Right: s] Calf Left: Right: Point of Measurement: From Medial Instep 30 cm Ankle Left: Right: Point of Measurement: From Medial Instep 20 cm Vascular Assessment Pulses: Dorsalis Pedis Palpable: [Left:Yes] Electronic Signature(s) Signed: 03/22/2021 5:57:05 PM By: Levan Hurst RN, BSN Entered By: Levan Hurst on 03/20/2021 12:40:58 -------------------------------------------------------------------------------- Multi Wound Chart Details Patient Name: Date of Service: [name] RD L. 03/20/2021 12:30 PM Medical Record Number: [id number] Patient Account Number: [id number] Date of Birth/Sex: Treating RN: Dec 10, 1955 (65 y.o. [name] Primary Care Gissella Niblack: [name] Other Clinician: Referring Jamarri Vuncannon: Treating Kaysee Hergert/Extender: [name] in Treatment: 5 Vital Signs Height(in): 26 Pulse(bpm): 90 Weight(lbs): 260 Blood Pressure(mmHg): 101/65 Body Mass Index(BMI):  37 Temperature(F): 98.1 Respiratory Rate(breaths/min): 20 Photos: [1:No Photos Left, Medial Lower Leg] [N/A:N/A N/A] Wound Location: [1:Trauma] [N/A:N/A] Wounding Event: [1:Vasculopathy] [N/A:N/A] Primary Etiology: [1:Diabetic Wound/Ulcer of the Lower] [N/A:N/A] Secondary Etiology: [1:Extremity Cataracts, Anemia, Chronic] [N/A:N/A] Comorbid History: [1:Obstructive Pulmonary Disease (COPD), Sleep Apnea, Arrhythmia, Congestive Heart Failure, Deep Vein Thrombosis, Myocardial Infarction, Peripheral Venous Disease, Type II Diabetes, Osteoarthritis 09/06/2020] [N/A:N/A] Date Acquired: [1:5] [N/A:N/A] Weeks of Treatment: [1:Open] [N/A:N/A] Wound  Status: [1:0x0x0] [N/A:N/A] Measurements L x W x D (cm) [1:0] [N/A:N/A] A (cm) : rea [1:0] [N/A:N/A] Volume (cm) : [1:100.00%] [N/A:N/A] % Reduction in Area: [1:100.00%] [N/A:N/A] % Reduction in Volume: [1:Full Thickness Without Exposed] [N/A:N/A] Classification: [1:Support Structures None Present] [N/A:N/A] Exudate Amount: [1:None Present (0%)] [N/A:N/A] Granulation Amount: [1:None Present (0%)] [N/A:N/A] Necrotic Amount: [1:Fascia: No] [N/A:N/A] Exposed Structures: [1:Fat Layer (Subcutaneous Tissue): No Tendon: No Muscle: No Joint: No Bone: No Large (67-100%)] [N/A:N/A] Treatment Notes Electronic Signature(s) Signed: 03/20/2021 1:00:16 PM By: [name] Signed: 03/22/2021 5:57:05 PM By: Levan Hurst RN, BSN Entered By: Kalman Shan on 03/20/2021 12:53:39 -------------------------------------------------------------------------------- Multi-Disciplinary Care Plan Details Patient Name: Date of Service: [name] RD L. 03/20/2021 12:30 PM Medical Record Number: [id number] Patient Account Number: [id number] Date of Birth/Sex: Treating RN: 13-Dec-1955 (65 y.o. [name] Primary Care Aldine Grainger: [name] Other Clinician: Referring Vasco Chong: Treating Joannie Medine/Extender: [name] in Treatment:  5 Multidisciplinary Care Plan reviewed with physician Active Inactive Electronic Signature(s) Signed: 03/22/2021 5:57:05 PM By: Levan Hurst RN, BSN Entered By: Levan Hurst on 03/20/2021 12:48:20 -------------------------------------------------------------------------------- Pain Assessment Details Patient Name: Date of Service: [name] RD L. 03/20/2021 12:30 PM Medical Record Number: [id number] Patient Account Number: [id number] Date of Birth/Sex: Treating RN: 04-03-1956 (65 y.o. [name] Primary Care Jewelene Mairena: [name] Other Clinician: Referring Braeleigh Pyper: Treating Lacie Landry/Extender: [name] in Treatment: 5 Active Problems Location of Pain Severity and Description of Pain Patient Has Paino No Site Locations Pain Management and Medication Current Pain Management: Electronic Signature(s) Signed: 03/20/2021 2:54:57 PM By: Sandre Kitty Signed: 03/22/2021 5:57:05 PM By: Levan Hurst RN, BSN Entered By: Sandre Kitty on 03/20/2021 12:37:52 -------------------------------------------------------------------------------- Patient/Caregiver Education Details Patient Name: Date of Service: [name] RD L. 6/6/2022andnbsp12:30 PM Medical Record Number: [id number] Patient Account Number: [id number] Date of Birth/Gender: Treating RN: [birth year] (65 y.o. [name] Primary Care Physician: [name] Other Clinician: Referring Physician: Treating Physician/Extender: [name] in Treatment: 5 Education Assessment Education Provided To: Patient Education Topics Provided Wound/Skin Impairment: Methods: Explain/Verbal Responses: State content correctly Electronic Signature(s) Signed: 03/22/2021 5:57:05 PM By: Levan Hurst RN, BSN Entered By: Levan Hurst on 03/20/2021 12:44:53 -------------------------------------------------------------------------------- Wound Assessment Details Patient  Name: Date of Service: [name] RD L. 03/20/2021 12:30 PM Medical Record Number: [id number] Patient Account Number: [id number] Date of Birth/Sex: Treating RN: 06/30/1956 (65 y.o. [name] Primary Care Lura Falor: [name] Other Clinician: Referring Kaiyden Simkin: Treating Banyan Goodchild/Extender: [name] in Treatment: 5 Wound Status Wound Number: 1 Primary Vasculopathy Etiology: Wound Location: Left, Medial Lower Leg Secondary Diabetic Wound/Ulcer of the Lower Extremity Wounding Event: Trauma Etiology: Date Acquired: 09/06/2020 Wound Healed - Epithelialized Weeks Of Treatment: 5 Status: Clustered Wound: No Comorbid Cataracts, Anemia, Chronic Obstructive Pulmonary Disease History: (COPD), Sleep Apnea, Arrhythmia, Congestive Heart Failure, Deep Vein Thrombosis, Myocardial Infarction, Peripheral Venous Disease, Type II Diabetes, Osteoarthritis Photos Wound Measurements Length: (cm) Width: (cm) Depth: (cm) Area: (cm) Volume: (cm) 0 % Reduction in Area: 100% 0 % Reduction in Volume: 100% 0 Epithelialization: Large (67-100%) 0 Tunneling: No 0 Undermining: No Wound Description Classification: Full Thickness Without Exposed Support Structures Exudate Amount: None Present Foul Odor After Cleansing: No Slough/Fibrino No Wound Bed Granulation Amount: None Present (0%) Exposed Structure Necrotic Amount: None Present (0%) Fascia Exposed: No Fat Layer (Subcutaneous Tissue) Exposed: No Tendon Exposed: No Muscle Exposed: No Joint Exposed: No Bone Exposed: No Electronic Signature(s) Signed: 03/21/2021 2:13:29  PM By: Sandre Kitty Signed: 03/22/2021 5:57:05 PM By: Levan Hurst RN, BSN Entered By: Sandre Kitty on 03/21/2021 13:36:08 -------------------------------------------------------------------------------- Vitals Details Patient Name: Date of Service: [name], [name] RD L. 03/20/2021 12:30 PM Medical Record Number: [id number] Patient  Account Number: [id number] Date of Birth/Sex: Treating RN: 04/03/1956 (65 y.o. [name] Primary Care Klay Sobotka: [name] Other Clinician: Referring Valon Glasscock: Treating Shelda Truby/Extender: [name] in Treatment: 5 Vital Signs Time Taken: 12:37 Temperature (F): 98.1 Height (in): 70 Pulse (bpm): 76 Weight (lbs): 260 Respiratory Rate (breaths/min): 20 Body Mass Index (BMI): 37.3 Blood Pressure (mmHg): 101/65 Reference Range: 80 - 120 mg / dl Electronic Signature(s) Signed: 03/20/2021 2:54:57 PM By: Sandre Kitty Entered By: Sandre Kitty on 03/20/2021 12:37:44

## 2021-03-23 ENCOUNTER — Ambulatory Visit: Payer: Medicare HMO | Admitting: Family Medicine

## 2021-03-26 ENCOUNTER — Other Ambulatory Visit (HOSPITAL_COMMUNITY): Payer: Self-pay | Admitting: Cardiology

## 2021-03-26 NOTE — Progress Notes (Signed)
  Subjective:  Patient ID: [name], male    DOB: 1955/11/23,  MRN: [id number]  Chief Complaint  Patient presents with   Nail Problem       for check ingrown nails, right foot bunion    65 y.o. male returns with the above complaint. History confirmed with patient.  Doing well no new issues, he is still interested in bunion surgery  Objective:  Physical Exam: warm, good capillary refill, no trophic changes or ulcerative lesions, normal sensory exam, +2 DP and PT reduced bilateral and venous stasis dermatitis noted.  Moderate to severe hallux abductovalgus with hammertoe contractures 2 through 5 bilaterally, right worse than left.  Summary:  Right: Resting right ankle-brachial index indicates noncompressible right  lower extremity arteries. The right toe-brachial index is normal. ABIs are  unreliable.   Left: Resting left ankle-brachial index is within normal range. No  evidence of significant left lower extremity arterial disease. The left  toe-brachial index is normal.       *See table(s) above for measurements and observations.         Radiographs: X-ray of the right foot: Moderate hallux abductovalgus with the presence of metatarsus adductus deformity and pes planus Assessment:   No diagnosis found.   Plan:  Patient was evaluated and treated and all questions answered.  Patient educated on diabetes. Discussed proper diabetic foot care and discussed risks and complications of disease. Educated patient in depth on reasons to return to the office immediately should he/she discover anything concerning or new on the feet. All questions answered. Discussed proper shoes as well.  Discussed the etiology and treatment options for the condition in detail with the patient. Educated patient on the topical and oral treatment options for mycotic nails. Recommended debridement of the nails today. Sharp and mechanical debridement performed of all painful and mycotic nails today.  Nails debrided in length and thickness using a nail nipper to level of comfort. Discussed treatment options including appropriate shoe gear. Follow up as needed for painful nails.  Ingrowing corners of hallux nails debrided and slant back fashion  He still would eventually like to have his bunion deformity corrected which has been very painful for him.  He is going to to have his knee and hip issues addressed prior to this.  Also will require vascular eval prior to bunion surgery.  Return in about 9 weeks (around 05/23/2021) for at risk diabetic foot care.

## 2021-03-27 ENCOUNTER — Other Ambulatory Visit: Payer: Self-pay

## 2021-03-27 ENCOUNTER — Ambulatory Visit (INDEPENDENT_AMBULATORY_CARE_PROVIDER_SITE_OTHER): Payer: Medicare HMO

## 2021-03-27 VITALS — BP 102/70 | HR 78 | Temp 98.3°F | Ht 70.0 in | Wt 254.1 lb

## 2021-03-27 DIAGNOSIS — Z0000 Encounter for general adult medical examination without abnormal findings: Secondary | ICD-10-CM | POA: Diagnosis not present

## 2021-03-27 NOTE — Patient Instructions (Signed)
[name] , Thank you for taking time to come for your Medicare Wellness Visit. I appreciate your ongoing commitment to your health goals. Please review the following plan we discussed and let me know if I can assist you in the future.   Screening recommendations/referrals: Colonoscopy: 03/04/2015; due every 5 years Recommended yearly ophthalmology/optometry visit for glaucoma screening and checkup Recommended yearly dental visit for hygiene and checkup  Vaccinations: Influenza vaccine: 06/22/2020 Pneumococcal vaccine: 04/29/2017, 03/17/2021 Tdap vaccine: 02/23/2015; due every 10 years Shingles vaccine: no record   Covid-19: 03/21/2020, 04/11/2020, 11/03/2020  Advanced directives: Advance directive discussed with you today. Even though you declined this today please call our office should you change your mind and we can give you the proper paperwork for you to fill out.  Conditions/risks identified: Yes  Recommend to drink at least 6-8 8oz glasses of water per day.   Recommend to exercise for at least 150 minutes per week.   Recommend to remove any items from the home that may cause slips or trips.   Recommend to decrease portion sizes by eating 3 small healthy meals and at least 2 healthy snacks per day.   Recommend to begin DASH diet as directed below  Next appointment: Please schedule your next Medicare Wellness Visit with your Nurse Health Advisor in 1 year by calling [phone].  Preventive Care 66 Years and Older, Male Preventive care refers to lifestyle choices and visits with your health care provider that can promote health and wellness. What does preventive care include? A yearly physical exam. This is also called an annual well check. Dental exams once or twice a year. Routine eye exams. Ask your health care provider how often you should have your eyes checked. Personal lifestyle choices, including: Daily care of your teeth and gums. Regular physical activity. Eating a  healthy diet. Avoiding tobacco and drug use. Limiting alcohol use. Practicing safe sex. Taking low doses of aspirin every day. Taking vitamin and mineral supplements as recommended by your health care provider. What happens during an annual well check? The services and screenings done by your health care provider during your annual well check will depend on your age, overall health, lifestyle risk factors, and family history of disease. Counseling  Your health care provider may ask you questions about your: Alcohol use. Tobacco use. Drug use. Emotional well-being. Home and relationship well-being. Sexual activity. Eating habits. History of falls. Memory and ability to understand (cognition). Work and work Statistician. Screening  You may have the following tests or measurements: Height, weight, and BMI. Blood pressure. Lipid and cholesterol levels. These may be checked every 5 years, or more frequently if you are over 66 years old. Skin check. Lung cancer screening. You may have this screening every year starting at age 66 if you have a 30-pack-year history of smoking and currently smoke or have quit within the past 15 years. Fecal occult blood test (FOBT) of the stool. You may have this test every year starting at age 66. Flexible sigmoidoscopy or colonoscopy. You may have a sigmoidoscopy every 5 years or a colonoscopy every 10 years starting at age 66. Prostate cancer screening. Recommendations will vary depending on your family history and other risks. Hepatitis C blood test. Hepatitis B blood test. Sexually transmitted disease (STD) testing. Diabetes screening. This is done by checking your blood sugar (glucose) after you have not eaten for a while (fasting). You may have this done every 1-3 years. Abdominal aortic aneurysm (AAA) screening. You may need  this if you are a current or former smoker. Osteoporosis. You may be screened starting at age 66 if you are at high risk. screened starting at age 24 if you are at high risk. Talk  with your health care provider about your test results, treatment options, and if necessary, the need for more tests. Vaccines  Your health care provider may recommend certain vaccines, such as: Influenza vaccine. This is recommended every year. Tetanus, diphtheria, and acellular pertussis (Tdap, Td) vaccine. You may need a Td booster every 10 years. Zoster vaccine. You may need this after age 66. Pneumococcal 13-valent conjugate (PCV13) vaccine. One dose is recommended after age 66. Pneumococcal polysaccharide (PPSV23) vaccine. One dose is recommended after age 66. Talk to your health care provider about which screenings and vaccines you need and how often you need them. This information is not intended to replace advice given to you by your health care provider. Make sure you discuss any questions you have with your health care provider. Document Released: 10/28/2015 Document Revised: 06/20/2016 Document Reviewed: 08/02/2015 Elsevier Interactive Patient Education  2017 [place] Prevention in the Home Falls can cause injuries. They can happen to people of all ages. There are many things you can do to make your home safe and to help prevent falls. What can I do on the outside of my home? Regularly fix the edges of walkways and driveways and fix any cracks. Remove anything that might make you trip as you walk through a door, such as a raised step or threshold. Trim any bushes or trees on the path to your home. Use bright outdoor lighting. Clear any walking paths of anything that might make someone trip, such as rocks or tools. Regularly check to see if handrails are loose or broken. Make sure that both sides of any steps have handrails. Any raised decks and porches should have guardrails on the edges. Have any leaves, snow, or ice cleared regularly. Use sand or salt on walking paths during winter. Clean up any spills in your garage right away. This includes oil or grease  spills. What can I do in the bathroom? Use night lights. Install grab bars by the toilet and in the tub and shower. Do not use towel bars as grab bars. Use non-skid mats or decals in the tub or shower. If you need to sit down in the shower, use a plastic, non-slip stool. Keep the floor dry. Clean up any water that spills on the floor as soon as it happens. Remove soap buildup in the tub or shower regularly. Attach bath mats securely with double-sided non-slip rug tape. Do not have throw rugs and other things on the floor that can make you trip. What can I do in the bedroom? Use night lights. Make sure that you have a light by your bed that is easy to reach. Do not use any sheets or blankets that are too big for your bed. They should not hang down onto the floor. Have a firm chair that has side arms. You can use this for support while you get dressed. Do not have throw rugs and other things on the floor that can make you trip. What can I do in the kitchen? Clean up any spills right away. Avoid walking on wet floors. Keep items that you use a lot in easy-to-reach places. If you need to reach something above you, use a strong step stool that has a grab bar. Keep electrical cords out of the way. Do not use floor polish or wax that makes floors  slippery. If you must use wax, use non-skid floor wax. Do not have throw rugs and other things on the floor that can make you trip. What can I do with my stairs? Do not leave any items on the stairs. Make sure that there are handrails on both sides of the stairs and use them. Fix handrails that are broken or loose. Make sure that handrails are as long as the stairways. Check any carpeting to make sure that it is firmly attached to the stairs. Fix any carpet that is loose or worn. Avoid having throw rugs at the top or bottom of the stairs. If you do have throw rugs, attach them to the floor with carpet tape. Make sure that you have a light switch at the  top of the stairs and the bottom of the stairs. If you do not have them, ask someone to add them for you. What else can I do to help prevent falls? Wear shoes that: Do not have high heels. Have rubber bottoms. Are comfortable and fit you well. Are closed at the toe. Do not wear sandals. If you use a stepladder: Make sure that it is fully opened. Do not climb a closed stepladder. Make sure that both sides of the stepladder are locked into place. Ask someone to hold it for you, if possible. Clearly mark and make sure that you can see: Any grab bars or handrails. First and last steps. Where the edge of each step is. Use tools that help you move around (mobility aids) if they are needed. These include: Canes. Walkers. Scooters. Crutches. Turn on the lights when you go into a dark area. Replace any light bulbs as soon as they burn out. Set up your furniture so you have a clear path. Avoid moving your furniture around. If any of your floors are uneven, fix them. If there are any pets around you, be aware of where they are. Review your medicines with your doctor. Some medicines can make you feel dizzy. This can increase your chance of falling. Ask your doctor what other things that you can do to help prevent falls. This information is not intended to replace advice given to you by your health care provider. Make sure you discuss any questions you have with your health care provider. Document Released: 07/28/2009 Document Revised: 03/08/2016 Document Reviewed: 11/05/2014 Elsevier Interactive Patient Education  2017 [employer].

## 2021-03-27 NOTE — Progress Notes (Signed)
Subjective:   [name] is a 65 y.o. male who presents for Medicare Annual/Subsequent preventive examination.  Review of Systems     Cardiac Risk Factors include: advanced age (>61men, >45 women);diabetes mellitus;dyslipidemia;hypertension;male gender;obesity (BMI >30kg/m2)     Objective:    Today's Vitals   03/27/21 1317 03/27/21 1322  BP:  102/70  Pulse:  78  Temp:  98.3 F (36.8 C)  TempSrc:  Temporal  SpO2:  95%  Weight:  254 lb 1.6 oz (115.3 kg)  Height:  5\' 10"  (1.778 m)  PainSc: 7     Body mass index is 36.46 kg/m.  Advanced Directives 03/27/2021 03/25/2020 12/10/2019 10/25/2017 09/18/2017 09/18/2017 04/29/2017  Does Patient Have a Medical Advance Directive? No Yes Yes No Yes Yes Yes  Type of Advance Directive - Living will Living will - Living will Living will [place];Living will  Does patient want to make changes to medical advance directive? - No - Patient declined No - Patient declined - No - Patient declined No - Patient declined -  Copy of [place] in Chart? - - - - - - No - copy requested  Would patient like information on creating a medical advance directive? No - Patient declined - - No - Patient declined - - -  Pre-existing out of facility DNR order (yellow form or pink MOST form) - - - - - - -    Current Medications (verified) Outpatient Encounter Medications as of 03/27/2021  Medication Sig   acetaminophen (TYLENOL) 500 MG tablet Take 500 mg by mouth every 6 (six) hours as needed.   albuterol (VENTOLIN HFA) 108 (90 Base) MCG/ACT inhaler INHALE 2 PUFFS 4 TIMES A DAY as needed   ALPRAZolam (XANAX) 0.5 MG tablet TAKE 2 TABLETS BY MOUTH AT BEDTIME   AMBULATORY NON FORMULARY MEDICATION Rolling walker with a bench   augmented betamethasone dipropionate (DIPROLENE-AF) 0.05 % cream Apply topically.   carisoprodol (SOMA) 350 MG tablet TAKE 1 TABLET BY MOUTH THREE TIMES A DAY AS NEEDED FOR MUSCLE SPASMS   carvedilol (COREG)  12.5 MG tablet Take 1 tablet (12.5 mg total) by mouth 2 (two) times daily with a meal.   Cholecalciferol 100 MCG (4000 UT) CAPS 1 tab by mouth once daily   dapagliflozin propanediol (FARXIGA) 10 MG TABS tablet Take 1 tablet (10 mg total) by mouth daily before breakfast.   digoxin (LANOXIN) 0.25 MG tablet TAKE 1/2 TABLET BY MOUTH EVERY DAY   doxepin (SINEQUAN) 10 MG capsule Take 2 capsules (20 mg total) by mouth at bedtime as needed.   Dupilumab (DUPIXENT) 300 MG/2ML SOPN Inject 1 Dose into the skin every 14 (fourteen) days.   Evolocumab (REPATHA SURECLICK) 595 MG/ML SOAJ Inject 1 pen into the skin every 14 (fourteen) days.   fluticasone (FLONASE) 50 MCG/ACT nasal spray PLACE 2 SPRAYS INTO BOTH NOSTRILS DAILY AS NEEDED FOR ALLERGIES OR RHINITIS.   hydrOXYzine (ATARAX/VISTARIL) 10 MG tablet TAKE 1 TABLET (10 MG TOTAL) BY MOUTH 3 (THREE) TIMES DAILY AS NEEDED FOR ITCHING.   lidocaine (LIDODERM) 5 % APPLY 1 PATCH TO AFFECTED AREA FOR UP TO 12 HOURS . DO NOT USE MORE THAN ONE PATCH IN 24 HOURS.   metolazone (ZAROXOLYN) 2.5 MG tablet Take 1 tablet (2.5 mg total) by mouth as directed. (Patient not taking: Reported on 01/31/2021)   omeprazole (PRILOSEC) 20 MG capsule TAKE 1 CAPSULE BY MOUTH TWICE A DAY   potassium chloride SA (KLOR-CON) 20 MEQ tablet Take  1 tablet (20 mEq total) by mouth daily.   sacubitril-valsartan (ENTRESTO) 49-51 MG Take 0.5 tablets by mouth 2 (two) times daily.   spironolactone (ALDACTONE) 25 MG tablet TAKE 1 TABLET BY MOUTH EVERY DAY   tamsulosin (FLOMAX) 0.4 MG CAPS capsule    torsemide (DEMADEX) 20 MG tablet Take 4 tablets (80 mg total) by mouth in the morning AND 3 tablets (60 mg total) every evening.   traMADol (ULTRAM) 50 MG tablet TAKE 1 TABLET BY MOUTH DAILY AS NEEDED.   traZODone (DESYREL) 100 MG tablet TAKE 1 TABLET BY MOUTH EVERY DAY AT BEDTIME AS NEEDED   TUMS 500 MG chewable tablet Chew 500-2,000 mg by mouth every 4 (four) hours as needed for indigestion or heartburn.     valACYclovir (VALTREX) 1000 MG tablet TAKE 1 TABLET BY MOUTH THREE TIMES A DAY   venlafaxine XR (EFFEXOR-XR) 75 MG 24 hr capsule TAKE 1 CAPSULE BY MOUTH DAILY WITH BREAKFAST.   warfarin (COUMADIN) 10 MG tablet Take 10 mg by mouth daily. Mon,wed,fri,sat   zolpidem (AMBIEN) 10 MG tablet TAKE 1 TABLET BY MOUTH AT BEDTIME AS NEEDED   No facility-administered encounter medications on file as of 03/27/2021.    Allergies (verified) Amiodarone, Statins, and Tape   History: Past Medical History:  Diagnosis Date   AICD (automatic cardioverter/defibrillator) present    Anemia    supposed to be taking Vit B but doesn't   ANXIETY    takes Xanax nightly   Arthritis    Asthma    Albuterol prn and Advair daily;also takes Prednisone daily   Atrial fibrillation ([place]) 09/06/2015   Cardiomyopathy ([place])    a. EF 25% TEE July 2013; b. EF normalized 2015;  c. 03/2015 Echo: EF 40-45%, difrf HK, PASP 38 mmHg, Mild MR, sev LAE/RAE.   CHF (congestive heart failure) (HCC)    Chronic constipation    takes OTC stool softener   COPD (chronic obstructive pulmonary disease) (HCC)    "one dr says COPD; one dr says emphysema" (09/18/2017)   DEPRESSION    takes Zoloft and Doxepin daily   Diverticulitis    DYSKINESIA, ESOPHAGUS    Emphysema of lung ([place])    "one dr says COPD; one dr says emphysema" (09/18/2017)   Essential hypertension        FIBROMYALGIA    GERD (gastroesophageal reflux disease)        Glaucoma    HYPERLIPIDEMIA    a. Intolerant to statins.   INSOMNIA    takes Ambien nightly   Myocardial infarction [facility])    a. 2012 Myoview notable for prior infarct;  b. 03/2015 Lexiscan CL: EF 37%, diff HK, small area of inferior infarct from apex to base-->Med Rx.   Myocardial infarction ([place])    O2 dependent    "2.5L q hs & prn" (09/18/2017)   Paroxysmal atrial fibrillation ([place])    a. CHA2DS2VASc = 3--> takes Coumadin;  b. 03/15/2015 Successful TEE/DCCV;  c. 03/2015 recurrent afib, Amio d/c'd in setting  of hyperthyroidism.   Peripheral neuropathy    Pneumonia 12/2016   Rash and other nonspecific skin eruption 04/12/2009   no cause found saw dermatologists x 2 and allergist   SLEEP APNEA, OBSTRUCTIVE    a. doesn't use CPAP   Syncope    a. 03/2015 s/p MDT LINQ.   Type II diabetes mellitus ([place])        Past Surgical History:  Procedure Laterality Date   ACNE CYST REMOVAL     2  on back    AV NODE ABLATION N/A 10/25/2017   Procedure: AV NODE ABLATION;  Surgeon: Deboraha Sprang, MD;  Location: [place] CV LAB;  Service: Cardiovascular;  Laterality: N/A;   BIV ICD INSERTION CRT-D N/A 09/18/2017   Procedure: BIV ICD INSERTION CRT-D;  Surgeon: Deboraha Sprang, MD;  Location: [place] CV LAB;  Service: Cardiovascular;  Laterality: N/A;   CARDIAC CATHETERIZATION N/A 03/21/2016   Procedure: Right/Left Heart Cath and Coronary Angiography;  Surgeon: Larey Dresser, MD;  Location: [place] CV LAB;  Service: Cardiovascular;  Laterality: N/A;   CARDIOVERSION  04/18/2012   Procedure: CARDIOVERSION;  Surgeon: Fay Records, MD;  Location: [place];  Service: Cardiovascular;  Laterality: N/A;   CARDIOVERSION  04/25/2012   Procedure: CARDIOVERSION;  Surgeon: Thayer Headings, MD;  Location: [place];  Service: Cardiovascular;  Laterality: N/A;   CARDIOVERSION  04/25/2012   Procedure: CARDIOVERSION;  Surgeon: Fay Records, MD;  Location: [place];  Service: Cardiovascular;  Laterality: N/A;   CARDIOVERSION  05/09/2012   Procedure: CARDIOVERSION;  Surgeon: Sherren Mocha, MD;  Location: [place];  Service: Cardiovascular;  Laterality: N/A;  changed from crenshaw to cooper by trish/leone-endo   CARDIOVERSION N/A 03/15/2015   Procedure: CARDIOVERSION;  Surgeon: Thayer Headings, MD;  Location: [place];  Service: Cardiovascular;  Laterality: N/A;   COLONOSCOPY     COLONOSCOPY WITH PROPOFOL N/A 10/21/2014   Procedure: COLONOSCOPY WITH PROPOFOL;  Surgeon: Ladene Artist, MD;  Location: [facility];  Service:  Endoscopy;  Laterality: N/A;   EP IMPLANTABLE DEVICE N/A 04/06/2015   Procedure: Loop Recorder Insertion;  Surgeon: Evans Lance, MD;  Location: [place] CV LAB;  Service: Cardiovascular;  Laterality: N/A;   ESOPHAGOGASTRODUODENOSCOPY     JOINT REPLACEMENT     LOOP RECORDER REMOVAL N/A 09/18/2017   Procedure: LOOP RECORDER REMOVAL;  Surgeon: Deboraha Sprang, MD;  Location: [place] CV LAB;  Service: Cardiovascular;  Laterality: N/A;   RIGHT/LEFT HEART CATH AND CORONARY ANGIOGRAPHY N/A 01/28/2017   Procedure: Right/Left Heart Cath and Coronary Angiography;  Surgeon: Larey Dresser, MD;  Location: [place] CV LAB;  Service: Cardiovascular;  Laterality: N/A;   TEE WITHOUT CARDIOVERSION  04/25/2012   Procedure: TRANSESOPHAGEAL ECHOCARDIOGRAM (TEE);  Surgeon: Thayer Headings, MD;  Location: [place];  Service: Cardiovascular;  Laterality: N/A;   TEE WITHOUT CARDIOVERSION N/A 03/15/2015   Procedure: TRANSESOPHAGEAL ECHOCARDIOGRAM (TEE);  Surgeon: Thayer Headings, MD;  Location: [place];  Service: Cardiovascular;  Laterality: N/A;   TONSILLECTOMY AND ADENOIDECTOMY     TOTAL KNEE ARTHROPLASTY Right 06/15/2014   Procedure: TOTAL KNEE ARTHROPLASTY;  Surgeon: Renette Butters, MD;  Location: [place];  Service: Orthopedics;  Laterality: Right;   Family History  Problem Relation Age of Onset   COPD Mother    Asthma Mother    Colon polyps Mother    Allergies Mother    Hypothyroidism Mother    Asthma Maternal Grandmother    Colon cancer Neg Hx    Social History   Socioeconomic History   Marital status: Divorced    Spouse name: Not on file   Number of children: 2   Years of education: Not on file   Highest education level: Not on file  Occupational History   Occupation: retired/disabled. prev worked in Therapist, sports.    Employer: DISABLED  Tobacco Use   Smoking status: Former    Packs/day: 2.00    Years: 30.00  Pack years: 60.00    Types: Cigarettes    Quit date:  10/16/2007    Years since quitting: 13.4   Smokeless tobacco: Never  Vaping Use   Vaping Use: Never used  Substance and Sexual Activity   Alcohol use: No   Drug use: No   Sexual activity: Not Currently  Other Topics Concern   Not on file  Social History Narrative   Lives alone.   Social Determinants of Health   Financial Resource Strain: Low Risk    Difficulty of Paying Living Expenses: Not hard at all  Food Insecurity: No Food Insecurity   Worried About Charity fundraiser in the Last Year: Never true   [place] in the Last Year: Never true  Transportation Needs: No Transportation Needs   Lack of Transportation (Medical): No   Lack of Transportation (Non-Medical): No  Physical Activity: Inactive   Days of Exercise per Week: 0 days   Minutes of Exercise per Session: 0 min  Stress: No Stress Concern Present   Feeling of Stress : Not at all  Social Connections: Moderately Integrated   Frequency of Communication with Friends and Family: More than three times a week   Frequency of Social Gatherings with Friends and Family: More than three times a week   Attends Religious Services: More than 4 times per year   Active Member of [employer] or Organizations: Yes   Attends Music therapist: More than 4 times per year   Marital Status: Never married    Tobacco Counseling Counseling given: Not Answered   Clinical Intake:  Pre-visit preparation completed: Yes  Pain : 0-10 Pain Score: 7  Pain Type: Chronic pain Pain Location: Knee Pain Orientation: Left Pain Radiating Towards: none Pain Descriptors / Indicators: Discomfort, Aching, Pressure, Throbbing Pain Onset: More than a month ago Pain Frequency: Constant Pain Relieving Factors: Tramadol, Nsaids Effect of Pain on Daily Activities: Pain can diminish job performance, lower motivation to exercise, and prevent you from completing daily tasks. Pain produces disability and affects the quality of life.  Pain  Relieving Factors: Tramadol, Nsaids  BMI - recorded: 36.46 Nutritional Status: BMI > 30  Obese Nutritional Risks: None Diabetes: Yes CBG done?: No Did pt. bring in CBG monitor from home?: No  How often do you need to have someone help you when you read instructions, pamphlets, or other written materials from your doctor or pharmacy?: 1 - Never What is the last grade level you completed in school?: GED  Diabetic? yes  Interpreter Needed?: No  Information entered by :: Lisette Abu, LPN   Activities of Daily Living In your present state of health, do you have any difficulty performing the following activities: 03/27/2021  Hearing? N  Vision? N  Difficulty concentrating or making decisions? N  Walking or climbing stairs? Y  Dressing or bathing? N  Doing errands, shopping? N  Preparing Food and eating ? N  Using the Toilet? N  In the past six months, have you accidently leaked urine? N  Do you have problems with loss of bowel control? N  Managing your Medications? N  Managing your Finances? N  Housekeeping or managing your Housekeeping? N  Some recent data might be hidden    Patient Care Team: [name], MD as PCP - General Larey Dresser, MD as PCP - Advanced Heart Failure (Cardiology) Himmelrich, Bryson Ha, RD (Inactive) as Dietitian Knox Royalty, RN as [facility]  Indicate any recent Medical Services you may have received from other than [facility] providers in the past year (date may be approximate).     Assessment:   This is a routine wellness examination for [place].  Hearing/Vision screen Hearing Screening - Comments:: No issues with hearing; no hearing aids. Vision Screening - Comments:: Wears glasses and gets eyes check at [facility].  Dietary issues and exercise activities discussed: Current Exercise Habits: The patient does not participate in regular exercise at present, Exercise limited by: orthopedic  condition(s);psychological condition(s);respiratory conditions(s);cardiac condition(s)   Goals Addressed             This Visit's Progress    Patient Stated       To get back in the gym, keep my HgA1C down and watch my sodium/sugar/carbohydrate intake.       Depression Screen PHQ 2/9 Scores 03/27/2021 12/01/2020 03/25/2020 03/23/2020 03/23/2020 03/23/2019 03/27/2018  PHQ - 2 Score 2 0 0 0 0 0 0  PHQ- 9 Score - 5 - - - - -  Exception Documentation - - - - - - -    Fall Risk Fall Risk  03/27/2021 12/01/2020 03/25/2020 03/23/2020 03/23/2020  Falls in the past year? 1 0 0 0 0  Comment - - - - -  Number falls in past yr: 1 0 0 - 0  Injury with Fall? 1 0 0 - 0  Risk Factor Category  - - - - -  Risk for fall due to : History of fall(s);Impaired balance/gait;Orthopedic patient - History of fall(s);Orthopedic patient - Impaired balance/gait;History of fall(s)  Follow up Falls evaluation completed - Falls evaluation completed - Falls evaluation completed  Comment - - - - -    FALL RISK PREVENTION PERTAINING TO THE HOME:  Any stairs in or around the home? No  If so, are there any without handrails? No  Home free of loose throw rugs in walkways, pet beds, electrical cords, etc? Yes  Adequate lighting in your home to reduce risk of falls? Yes   ASSISTIVE DEVICES UTILIZED TO PREVENT FALLS:  Life alert? No  Use of a cane, walker or w/c? Yes  Grab bars in the bathroom? No  Shower chair or bench in shower? No  Elevated toilet seat or a handicapped toilet? No   TIMED UP AND GO:  Was the test performed? Yes .  Length of time to ambulate 10 feet: 13 sec.   Gait slow and steady with assistive device  Cognitive Function: Normal cognitive status assessed by direct observation by this Nurse Health Advisor. No abnormalities found.  No flowsheet data found.         Immunizations Immunization History  Administered Date(s) Administered   Influenza Inj Mdck Quad With Preservative 07/22/2019    Influenza Split 07/09/2011, 07/23/2012   Influenza Whole 09/16/2006, 08/26/2007, 07/27/2008, 07/26/2009, 07/07/2010   Influenza, High Dose Seasonal PF 06/22/2020   Influenza,inj,Quad PF,6+ Mos 06/30/2013, 08/19/2014, 06/27/2015, 07/13/2016, 07/09/2017, 07/18/2018   PFIZER(Purple Top)SARS-COV-2 Vaccination 03/21/2020, 04/11/2020, 11/03/2020   Pneumococcal Conjugate-13 04/29/2017   Pneumococcal Polysaccharide-23 03/29/2010, 07/07/2010, 02/23/2015, 03/17/2021   Td 10/16/2003   Tdap 02/23/2015    TDAP status: Up to date  Flu Vaccine status: Up to date  Pneumococcal vaccine status: Up to date  Covid-19 vaccine status: Completed vaccines  Qualifies for Shingles Vaccine? Yes   Zostavax completed No   Shingrix Completed?: No.    Education has been provided regarding the importance of this vaccine. Patient has been advised to call  insurance company to determine out of pocket expense if they have not yet received this vaccine. Advised may also receive vaccine at local pharmacy or Health Dept. Verbalized acceptance and understanding.  Screening Tests Health Maintenance  Topic Date Due   COLONOSCOPY (Pts 45-82yrs Insurance coverage will need to be confirmed)  03/03/2020   COVID-19 Vaccine (4 - Booster for Pfizer series) 02/01/2021   OPHTHALMOLOGY EXAM  02/04/2021   FOOT EXAM  03/23/2021   Zoster Vaccines- Shingrix (1 of 2) 06/17/2021 (Originally 12/15/1974)   INFLUENZA VACCINE  05/15/2021   HEMOGLOBIN A1C  08/31/2021   TETANUS/TDAP  02/22/2025   Hepatitis C Screening  Completed   PNA vac Low Risk Adult  Completed   HPV VACCINES  Aged Out    Health Maintenance  Health Maintenance Due  Topic Date Due   COLONOSCOPY (Pts 45-56yrs Insurance coverage will need to be confirmed)  03/03/2020   COVID-19 Vaccine (4 - Booster for [place] series) 02/01/2021   OPHTHALMOLOGY EXAM  02/04/2021   FOOT EXAM  03/23/2021    Colorectal cancer screening: Type of screening: Colonoscopy. Completed  03/04/2015. Repeat every 5 years  Lung Cancer Screening: (Low Dose CT Chest recommended if Age 96-80 years, 30 pack-year currently smoking OR have quit w/in 15years.) does qualify.   Lung Cancer Screening Referral: no  Additional Screening:  Hepatitis C Screening: does qualify; Completed yes  Vision Screening: Recommended annual ophthalmology exams for early detection of glaucoma and other disorders of the eye. Is the patient up to date with their annual eye exam?  Yes  Who is the provider or what is the name of the office in which the patient attends annual eye exams? Laurence Aly, OD at [facility] If pt is not established with a provider, would they like to be referred to a provider to establish care? No .   Dental Screening: Recommended annual dental exams for proper oral hygiene  Community Resource Referral / Chronic Care Management: CRR required this visit?  No   CCM required this visit?  No      Plan:     I have personally reviewed and noted the following in the patient's chart:   Medical and social history Use of alcohol, tobacco or illicit drugs  Current medications and supplements including opioid prescriptions. Patient is not currently taking opioid prescriptions. Functional ability and status Nutritional status Physical activity Advanced directives List of other physicians Hospitalizations, surgeries, and ER visits in previous 12 months Vitals Screenings to include cognitive, depression, and falls Referrals and appointments  In addition, I have reviewed and discussed with patient certain preventive protocols, quality metrics, and best practice recommendations. A written personalized care plan for preventive services as well as general preventive health recommendations were provided to patient.     Sheral Flow, LPN   [id number]   Nurse Notes: n/a

## 2021-03-28 ENCOUNTER — Encounter (HOSPITAL_COMMUNITY): Payer: Self-pay | Admitting: Cardiology

## 2021-03-28 ENCOUNTER — Ambulatory Visit (HOSPITAL_COMMUNITY)
Admission: RE | Admit: 2021-03-28 | Discharge: 2021-03-28 | Disposition: A | Discharge status: Home / Self Care | Payer: Medicare HMO | Source: Ambulatory Visit | Attending: Cardiology | Admitting: Cardiology

## 2021-03-28 VITALS — BP 104/68 | HR 76 | Wt 254.2 lb

## 2021-03-28 DIAGNOSIS — I2510 Atherosclerotic heart disease of native coronary artery without angina pectoris: Secondary | ICD-10-CM | POA: Diagnosis not present

## 2021-03-28 DIAGNOSIS — J449 Chronic obstructive pulmonary disease, unspecified: Secondary | ICD-10-CM | POA: Diagnosis not present

## 2021-03-28 DIAGNOSIS — M1712 Unilateral primary osteoarthritis, left knee: Secondary | ICD-10-CM | POA: Diagnosis not present

## 2021-03-28 DIAGNOSIS — Z95810 Presence of automatic (implantable) cardiac defibrillator: Secondary | ICD-10-CM | POA: Insufficient documentation

## 2021-03-28 DIAGNOSIS — I5032 Chronic diastolic (congestive) heart failure: Secondary | ICD-10-CM

## 2021-03-28 DIAGNOSIS — G4733 Obstructive sleep apnea (adult) (pediatric): Secondary | ICD-10-CM | POA: Insufficient documentation

## 2021-03-28 DIAGNOSIS — I442 Atrioventricular block, complete: Secondary | ICD-10-CM | POA: Insufficient documentation

## 2021-03-28 DIAGNOSIS — R21 Rash and other nonspecific skin eruption: Secondary | ICD-10-CM | POA: Insufficient documentation

## 2021-03-28 DIAGNOSIS — Z8616 Personal history of COVID-19: Secondary | ICD-10-CM | POA: Diagnosis not present

## 2021-03-28 DIAGNOSIS — Z79899 Other long term (current) drug therapy: Secondary | ICD-10-CM | POA: Diagnosis not present

## 2021-03-28 DIAGNOSIS — I4821 Permanent atrial fibrillation: Secondary | ICD-10-CM | POA: Insufficient documentation

## 2021-03-28 DIAGNOSIS — Z7901 Long term (current) use of anticoagulants: Secondary | ICD-10-CM | POA: Diagnosis not present

## 2021-03-28 DIAGNOSIS — I428 Other cardiomyopathies: Secondary | ICD-10-CM | POA: Insufficient documentation

## 2021-03-28 DIAGNOSIS — Z9981 Dependence on supplemental oxygen: Secondary | ICD-10-CM | POA: Insufficient documentation

## 2021-03-28 DIAGNOSIS — Z87891 Personal history of nicotine dependence: Secondary | ICD-10-CM | POA: Diagnosis not present

## 2021-03-28 DIAGNOSIS — I5022 Chronic systolic (congestive) heart failure: Secondary | ICD-10-CM | POA: Insufficient documentation

## 2021-03-28 DIAGNOSIS — E781 Pure hyperglyceridemia: Secondary | ICD-10-CM | POA: Diagnosis not present

## 2021-03-28 DIAGNOSIS — I110 Hypertensive heart disease with heart failure: Secondary | ICD-10-CM | POA: Diagnosis not present

## 2021-03-28 LAB — BASIC METABOLIC PANEL
Anion gap: 9 (ref 5–15)
BUN: 26 mg/dL — ABNORMAL HIGH (ref 8–23)
CO2: 25 mmol/L (ref 22–32)
Calcium: 8.9 mg/dL (ref 8.9–10.3)
Chloride: 102 mmol/L (ref 98–111)
Creatinine, Ser: 1.34 mg/dL — ABNORMAL HIGH (ref 0.61–1.24)
GFR, Estimated: 58 mL/min — ABNORMAL LOW (ref >60)
Glucose, Bld: 91 mg/dL (ref 70–99)
Potassium: 4.2 mmol/L (ref 3.5–5.1)
Sodium: 136 mmol/L (ref 135–145)

## 2021-03-28 LAB — DIGOXIN LEVEL: Digoxin Level: 0.5 ng/mL — ABNORMAL LOW (ref 0.8–2.0)

## 2021-03-28 LAB — BRAIN NATRIURETIC PEPTIDE: B Natriuretic Peptide: 52.7 pg/mL (ref 0.0–100.0)

## 2021-03-28 NOTE — Progress Notes (Signed)
ID:  [name], DOB April 09, 1956, MRN [id number]  Provider location: [facility] Type of Visit: Established patient  PCP:  [name], MD  Cardiologist:  Dr. [name]   History of Present Illness: [name] is a 65 y.o. male who has history of COPD on home oxygen, permanent atrial fibrillation, and chronic systolic CHF.  Amiodarone and DCCV were tried for atrial fibrillation in the past without success.   Patient has been noted to have a low EF since 2013.  EF improved by to normal by 2015 but dropped to 30-35% by 11/16.  Cath in 6/17 showed some CAD but not enough to cause cardiomyopathy.  He was admitted in 3/18 with PNA, atrial fibrillation/RVR, and acute on chronic systolic CHF.  Echo in 3/18 showed fall in EF to 10-15%.   I took him for right and left heart cath in 4/18, which showed nonobstructive CAD and relatively optimized filling pressures.    Syncope in 11/18. Loop recorder showed 6 second pause and periods of complete heart block. He had Medtronic BiV ICD placed in 12/18.  In 1/19, he had AV nodal ablation to promote BiV pacing.   Echo was done 6/19 with EF 25-30% with moderate RV dilation/mildly decreased systolic function. CPX 7/19 was submaximal, probably mild functional impairment.    Echo in 1/21 showed EF 35-40% with diffuse hypokinesis, moderate RV dilation with mildly decreased systolic function.   He was admitted with COVID-19 PNA in 2/19.  He had AKI/hypotension and Entresto was stopped.   Echo in 3/22 showed EF 40% with diffuse hypokinesis, mild RV enlargement, mildly decreased RV systolic function.   Patient returns for followup of CHF.   Weight is up 2 lbs.  Entresto was decreased with low BP; BP now stable and he denies further lightheadedness.  Main complaint is left knee pain, needs TKR.  He has rare atypical chest pain.  He walks with a cane, generally no dyspnea walking on flat ground, just pain.  He had a venous stasis ulcer on lower  leg that has healed.   Labs (4/18): pro-BNP 904, K 3.1, creatinine 1.39 => 1.08 with BUN 66 => 37, hgb 14.1.  Labs (5/18): hemoglobin 13.1 Labs (6/18): K 4, creatinine 1.3, BNP 294 Labs (7/18): K 3.8, creatinine 1.23, LDL 96, HDL 42 Labs (8/18): K 4.5, creatinine 1.22 Labs (10/18): digoxin 0.4 Labs (12/18): LDL 115, HDL 37, TGs 214, K 4.2, creatinine 0.84 Labs (1/19): K 4.4, creatinine 0.75, pro-BNP 433 Labs (2/19): digoxin 0.4 Labs (3/19): LDL 121, hgb 12.8, K 4.5, creatinine 0.98 Labs (6/19): LDL 48, HDL 45, K 5, creatinine 0.94, digoxin 0.3 Labs (9/19): K 3.7, creatinine 1.09 => 1.32, digoxin 0.2 Labs (11/19): LDL 115 Labs (12/20): K 3.9, creatinine 0.87, LDL 94  Labs (3/21): K 4, creatinine 1.08 Labs (6/21): LDL 109 Labs (7/21): K 3.7, creatinine 1.15 Labs (12/21): LDL 129, TGs 239, hgb 12.9, K 3.9, creatinine 0.98, digoxin 0.8 Labs (1/22): ALT 76, AST 40, LDL 162, K 3.8 =>4.6, creatinine 1.16 => 1.12, digoxin 0.7, repeat LFTs normal Labs (5/22): LDL 72, TGs 335, K 4.2, creatinine 1.29  PMH: 1. Asthma 2. COPD: On home oxygen. Prior smoker.  3. Atrial fibrillation: Permanent.  He failed DCCV and amiodarone in the past . 4. HTN 5. GERD 6. Hyperlipidemia: Myalgias with atorvastatin 7. BPH.  8. Chronic systolic CHF: Nonischemic cardiomyopathy.  - EF 25% by TEE in 7/13.  Normalized on 2015 echo. - Echo (11/16):  EF 30-35%.  - LHC/RHC (6/17): 80% ostial stenosis small D1, 30-40% pLAD.  Mean RA 4, PA 26/10, mean PCWP 11, CI 2.33.  - Echo (3/18): EF 10-15%, diffuse hypokinesis, severe LV dilation, moderate RV dilation with severely decreased systolic function.  - LHC/RHC (4/18): 40% proximal LAD.  Mean RA 8, PA 37/10 mean 22, mean PCWP 22, LVEDP 9, CI 2.39. - Medtronic BiV ICD placement in 12/18 with AV nodal ablation to promote BiV pacing in 1/19.  - Echo (6/19): EF 25-30% with mild LV dilation, moderately dilated RV with mildly decreased systolic function, IVC not dilated, PASP 23  mmHg.  - CPX (7/19): peak VO2 17.6 (78% predicted), VE/VCO2 slope 31, RER 1.02 => submaximal, probably mild functional impairment.  - Echo (1/21): EF 35-40% with diffuse hypokinesis, moderate RV dilation with mildly decreased systolic function. - Echo (3/22): EF 40% with diffuse hypokinesis, mild RV enlargement, mildly decreased RV systolic function.  9. Morbid obesity 10. ILR in place.  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 Complete heart block: s/p Medtronic CRT-D.  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 OSA: Untreated, unable to tolerate CPAP.  15. COVID-19 PNA 2/21.  16. Hyperlipidemia: Myalgias with multiple statins.  17. PAD: ABIs 10/21 noncompressible on right but TBI normal, normal ABI and TBI on left.   Social History   Socioeconomic History   Marital status: Divorced    Spouse name: Not on file   Number of children: 2   Years of education: Not on file   Highest education level: Not on file  Occupational History   Occupation: retired/disabled. prev worked in Therapist, sports.    Employer: DISABLED  Tobacco Use   Smoking status: Former    Packs/day: 2.00    Years: 30.00    Pack years: 60.00    Types: Cigarettes    Quit date: 10/16/2007    Years since quitting: 13.4   Smokeless tobacco: Never  Vaping Use   Vaping Use: Never used  Substance and Sexual Activity   Alcohol use: No   Drug use: No   Sexual activity: Not Currently  Other Topics Concern   Not on file  Social History Narrative   Lives alone.   Social Determinants of Health   Financial Resource Strain: Low Risk    Difficulty of Paying Living Expenses: Not hard at all  Food Insecurity: No Food Insecurity   Worried About Charity fundraiser in the Last Year: Never true   Gate in the Last Year: Never true  Transportation Needs: No Transportation Needs   Lack of Transportation (Medical): No   Lack of Transportation (Non-Medical): No  Physical Activity: Inactive   Days of Exercise per Week: 0 days    Minutes of Exercise per Session: 0 min  Stress: No Stress Concern Present   Feeling of Stress : Not at all  Social Connections: Moderately Integrated   Frequency of Communication with Friends and Family: More than three times a week   Frequency of Social Gatherings with Friends and Family: More than three times a week   Attends Religious Services: More than 4 times per year   Active Member of [employer] or Organizations: Yes   Attends Music therapist: More than 4 times per year   Marital Status: Never married  Human resources officer Violence: Not At Risk   Fear of Current or Ex-Partner: No   Emotionally Abused: No   Physically Abused: No   Sexually Abused: No   Family History  Problem  Relation Age of Onset   COPD Mother    Asthma Mother    Colon polyps Mother    Allergies Mother    Hypothyroidism Mother    Asthma Maternal Grandmother    Colon cancer Neg Hx    ROS: All systems reviewed and negative except as per HPI.   Current Outpatient Medications  Medication Sig Dispense Refill   acetaminophen (TYLENOL) 500 MG tablet Take 500 mg by mouth every 6 (six) hours as needed.     albuterol (VENTOLIN HFA) 108 (90 Base) MCG/ACT inhaler INHALE 2 PUFFS 4 TIMES A DAY as needed 54 g 3   ALPRAZolam (XANAX) 0.5 MG tablet TAKE 2 TABLETS BY MOUTH AT BEDTIME 60 tablet 2   AMBULATORY NON FORMULARY MEDICATION Rolling walker with a bench 1 Units 0   augmented betamethasone dipropionate (DIPROLENE-AF) 0.05 % cream Apply topically.     carisoprodol (SOMA) 350 MG tablet TAKE 1 TABLET BY MOUTH THREE TIMES A DAY AS NEEDED FOR MUSCLE SPASMS 90 tablet 5   carvedilol (COREG) 12.5 MG tablet Take 1 tablet (12.5 mg total) by mouth 2 (two) times daily with a meal. 180 tablet 3   Cholecalciferol 100 MCG (4000 UT) CAPS 1 tab by mouth once daily 30 capsule 99   dapagliflozin propanediol (FARXIGA) 10 MG TABS tablet Take 1 tablet (10 mg total) by mouth daily before breakfast. 30 tablet 11   digoxin (LANOXIN)  0.25 MG tablet TAKE 1/2 TABLET BY MOUTH EVERY DAY 45 tablet 3   doxepin (SINEQUAN) 10 MG capsule Take 2 capsules (20 mg total) by mouth at bedtime as needed. 180 capsule 1   Dupilumab (DUPIXENT) 300 MG/2ML SOPN Inject 1 Dose into the skin every 14 (fourteen) days.     Evolocumab (REPATHA SURECLICK) 623 MG/ML SOAJ Inject 1 pen into the skin every 14 (fourteen) days. 6 mL 3   fluticasone (FLONASE) 50 MCG/ACT nasal spray PLACE 2 SPRAYS INTO BOTH NOSTRILS DAILY AS NEEDED FOR ALLERGIES OR RHINITIS. 48 mL 1   hydrOXYzine (ATARAX/VISTARIL) 10 MG tablet TAKE 1 TABLET (10 MG TOTAL) BY MOUTH 3 (THREE) TIMES DAILY AS NEEDED FOR ITCHING. 60 tablet 2   lidocaine (LIDODERM) 5 % APPLY 1 PATCH TO AFFECTED AREA FOR UP TO 12 HOURS . DO NOT USE MORE THAN ONE PATCH IN 24 HOURS. 90 patch 2   omeprazole (PRILOSEC) 20 MG capsule TAKE 1 CAPSULE BY MOUTH TWICE A DAY 180 capsule 1   potassium chloride SA (KLOR-CON) 20 MEQ tablet Take 1 tablet (20 mEq total) by mouth daily. 90 tablet 3   sacubitril-valsartan (ENTRESTO) 49-51 MG Take 0.5 tablets by mouth 2 (two) times daily. 180 tablet 3   spironolactone (ALDACTONE) 25 MG tablet TAKE 1 TABLET BY MOUTH EVERY DAY 90 tablet 3   tamsulosin (FLOMAX) 0.4 MG CAPS capsule      torsemide (DEMADEX) 20 MG tablet TAKE 4 TABLETS (80 MG TOTAL) BY MOUTH 2 (TWO) TIMES DAILY. 720 tablet 1   traMADol (ULTRAM) 50 MG tablet TAKE 1 TABLET BY MOUTH DAILY AS NEEDED. 30 tablet 5   traZODone (DESYREL) 100 MG tablet TAKE 1 TABLET BY MOUTH EVERY DAY AT BEDTIME AS NEEDED 90 tablet 1   TUMS 500 MG chewable tablet Chew 500-2,000 mg by mouth every 4 (four) hours as needed for indigestion or heartburn.      valACYclovir (VALTREX) 1000 MG tablet TAKE 1 TABLET BY MOUTH THREE TIMES A DAY 21 tablet 2   venlafaxine XR (EFFEXOR-XR) 75 MG 24 hr  capsule TAKE 1 CAPSULE BY MOUTH DAILY WITH BREAKFAST. 90 capsule 1   warfarin (COUMADIN) 10 MG tablet Take 10 mg by mouth daily. Mon,wed,fri,sat     zolpidem (AMBIEN) 10  MG tablet TAKE 1 TABLET BY MOUTH AT BEDTIME AS NEEDED 90 tablet 1   No current facility-administered medications for this encounter.   BP 104/68   Pulse 76   Wt 115.3 kg (254 lb 3.2 oz)   SpO2 96%   BMI 36.47 kg/m  General: NAD Neck: No JVD, no thyromegaly or thyroid nodule.  Lungs: Clear to auscultation bilaterally with normal respiratory effort. CV: Nondisplaced PMI.  Heart regular S1/S2, no S3/S4, no murmur.  1+ ankle edema.  No carotid bruit.  Normal pedal pulses.  Abdomen: Soft, nontender, no hepatosplenomegaly, no distention.  Skin: Intact without lesions or rashes.  Neurologic: Alert and oriented x 3.  Psych: Normal affect. Extremities: No clubbing or cyanosis.  HEENT: Normal.   Assessment/Plan:  1. Atrial fibrillation: Permanent.  Now s/p AV nodal ablation to allow effective BiV pacing.  - Continue warfarin (cannot afford DOACs).  2. COPD: No longer smoking.  Not using oxygen.  3. Chronic systolic CHF: Nonischemic cardiomyopathy based on 6/17 cath. However, he had a recent significant fall in EF from 30-35% to 10-15% on echo from 3/18.  LHC/RHC in 4/18 showed nonobstructive CAD and relatively optimized filling pressures.  He now has Medtronic CRT-D device with AV nodal ablation to allow BiV pacing.  Echo 6/19 with EF 25-30% with mildly decreased RV systolic function. CPX 7/19 was submaximal but likely only mild functional limitation from CHF.  Echo in 1/21 showed EF 35-40%, echo in 3/22 showed EF up to 40% with mildly decreased RV systolic function.  NYHA class II-III symptoms.  Weight is stable.  I do not think that he is volume overloaded.  - Continue Coreg 12.5 mg bid.   - Continue spironolactone 25 mg daily.        - Continue Entresto 24/26 bid.   - Continue Farxiga 10 mg daily.   - Continue torsemide 80 qam/60 qpm.  BMET/BNP today.  - Continue digoxin 0.125 daily, check level.  4. CAD: Nonobstructive on 4/18 cath.  No exertional chest pain.  - He is on warfarin so no  ASA.  - Statin myalgias, continue Repatha. Good LDL in 5/22.  - Elevated triglycerides, will give trial of dietary changes but Vascepa would be next step.  5. Complete heart block: Now has Medtronic CRT-D device and s/p AV nodal ablation.  6. Rash: Spongiotic dermatitis.  Improved.  7. OSA: Unable to tolerate CPAP.   8. Elevated LFTs: Most recent LFTs back to normal.   9. Left knee OA: Needs TKR.  He is stable from a cardiac standpoint.  I think that he could have TKR with moderate but not unacceptable risk of complications.   Followup 3 months with APP  Signed, Loralie Champagne, MD  03/28/2021  [place] [address] [facility] [place] [address] [phone] (office) [phone] (fax)

## 2021-03-28 NOTE — Patient Instructions (Signed)
Labs done today.  We will contact you only if your labs are abnormal.   No medication changes were made. Please continue all current medications as prescribed.  Your physician recommends that you schedule a follow-up appointment in: 3 months  If you have any questions or concerns before your next appointment please send us a message through mychart or call our office at [phone].    TO LEAVE A MESSAGE FOR THE NURSE SELECT OPTION 2, PLEASE LEAVE A MESSAGE INCLUDING: YOUR NAME DATE OF BIRTH CALL BACK NUMBER REASON FOR CALL**this is important as we prioritize the call backs  YOU WILL RECEIVE A CALL BACK THE SAME DAY AS LONG AS YOU CALL BEFORE 4:00 PM   Do the following things EVERYDAY: Weigh yourself in the morning before breakfast. Write it down and keep it in a log. Take your medicines as prescribed Eat low salt foods--Limit salt (sodium) to 2000 mg per day.  Stay as active as you can everyday Limit all fluids for the day to less than 2 liters   At [facility], you and your health needs are our priority. As part of our continuing mission to provide you with exceptional heart care, we have created designated Provider Care Teams. These Care Teams include your primary Cardiologist (physician) and Advanced Practice Providers (APPs- Physician Assistants and Nurse Practitioners) who all work together to provide you with the care you need, when you need it.   You may see any of the following providers on your designated Care Team at your next follow up: Dr Daniel Bensimhon Dr Dalton McLean Amy Clegg, NP Brittainy Simmons, PA Lauren Kemp, PharmD   Please be sure to bring in all your medications bottles to every appointment.   

## 2021-04-06 ENCOUNTER — Telehealth: Payer: Self-pay

## 2021-04-06 NOTE — Telephone Encounter (Signed)
I called the patient to get him to send missed ICM transmission. I called tech support to get additional help. Tech support is sending him a new handheld in 7-10 business days.

## 2021-04-07 ENCOUNTER — Telehealth (HOSPITAL_COMMUNITY): Payer: Self-pay

## 2021-04-07 NOTE — Telephone Encounter (Signed)
Opposite leg; said the other leg has been improving.

## 2021-04-07 NOTE — Telephone Encounter (Signed)
Patient called stating that his left lower leg is swollen and about double in size. He denies any other cardiac symptoms at this time. He reports that his weight is about 255 right now and that he has been taking Torsemide 80mg  bid since Tuesday 04/04/21. Please advise. Patient is very concerned.

## 2021-04-07 NOTE — Telephone Encounter (Signed)
Patient advised and verbalized understanding 

## 2021-04-07 NOTE — Telephone Encounter (Signed)
No numbness,tingling,pain;not hot to touch,no discoloration

## 2021-04-07 NOTE — Progress Notes (Signed)
No ICM remote transmission received for 04/03/2021 and next ICM transmission scheduled for 04/27/2021.  Waiting on new monitor handset.

## 2021-04-10 ENCOUNTER — Other Ambulatory Visit: Payer: Self-pay

## 2021-04-10 ENCOUNTER — Encounter (HOSPITAL_BASED_OUTPATIENT_CLINIC_OR_DEPARTMENT_OTHER): Payer: Medicare HMO | Admitting: Internal Medicine

## 2021-04-10 DIAGNOSIS — L97929 Non-pressure chronic ulcer of unspecified part of left lower leg with unspecified severity: Secondary | ICD-10-CM | POA: Diagnosis not present

## 2021-04-10 DIAGNOSIS — I5022 Chronic systolic (congestive) heart failure: Secondary | ICD-10-CM | POA: Diagnosis not present

## 2021-04-10 DIAGNOSIS — I4819 Other persistent atrial fibrillation: Secondary | ICD-10-CM | POA: Diagnosis not present

## 2021-04-10 DIAGNOSIS — E11622 Type 2 diabetes mellitus with other skin ulcer: Secondary | ICD-10-CM | POA: Diagnosis not present

## 2021-04-10 DIAGNOSIS — Z86718 Personal history of other venous thrombosis and embolism: Secondary | ICD-10-CM | POA: Diagnosis not present

## 2021-04-10 DIAGNOSIS — S81811A Laceration without foreign body, right lower leg, initial encounter: Secondary | ICD-10-CM

## 2021-04-10 DIAGNOSIS — I110 Hypertensive heart disease with heart failure: Secondary | ICD-10-CM | POA: Diagnosis not present

## 2021-04-10 DIAGNOSIS — Z87891 Personal history of nicotine dependence: Secondary | ICD-10-CM | POA: Diagnosis not present

## 2021-04-10 DIAGNOSIS — I872 Venous insufficiency (chronic) (peripheral): Secondary | ICD-10-CM | POA: Diagnosis not present

## 2021-04-10 NOTE — Progress Notes (Addendum)
DAVYD, PODGORSKI ([id number]) Visit Report for 04/10/2021 Chief Complaint Document Details Patient Name: Date of Service: [name] RD L. 04/10/2021 12:30 PM Medical Record Number: [id number] Patient Account Number: [id number] Date of Birth/Sex: Treating RN: 01-Nov-1955 (65 y.o. [name] Primary Care Provider: [name] Other Clinician: Referring Provider: Treating Provider/Extender: [name] in Treatment: 8 Information Obtained from: Patient Chief Complaint right lower extremity wound Electronic Signature(s) Signed: 04/10/2021 1:44:54 PM By: [name] Entered By: Kalman Shan on 04/10/2021 13:40:57 -------------------------------------------------------------------------------- Debridement Details Patient Name: Date of Service: [name] RD L. 04/10/2021 12:30 PM Medical Record Number: [id number] Patient Account Number: [id number] Date of Birth/Sex: Treating RN: 02-13-1956 (65 y.o. [name] Primary Care Provider: [name] Other Clinician: Referring Provider: Treating Provider/Extender: [name] in Treatment: 8 Debridement Performed for Assessment: Wound #2 Right,Anterior Lower Leg Performed By: Physician Kalman Shan, DO Debridement Type: Debridement Level of Consciousness (Pre-procedure): Awake and Alert Pre-procedure Verification/Time Out Yes - 13:02 Taken: Start Time: 13:03 Pain Control: Lidocaine 4% Topical Solution T Area Debrided (L x W): otal 1 (cm) x 2 (cm) = 2 (cm) Tissue and other material debrided: Non-Viable, Subcutaneous, Skin: Dermis Level: Skin/Subcutaneous Tissue Debridement Description: Excisional Instrument: Forceps, Scissors Bleeding: Minimum Hemostasis Achieved: Pressure End Time: 13:08 Response to Treatment: Procedure was tolerated well Level of Consciousness (Post- Awake and Alert procedure): Post Debridement Measurements of Total Wound Length: (cm)  2 Width: (cm) 3.5 Depth: (cm) 0.1 Volume: (cm) 0.55 Character of Wound/Ulcer Post Debridement: Stable Post Procedure Diagnosis Same as Pre-procedure Electronic Signature(s) Signed: 04/10/2021 1:44:54 PM By: [name] Signed: 04/10/2021 5:08:20 PM By: [name] Entered By: [name] on 04/10/2021 13:08:42 -------------------------------------------------------------------------------- HPI Details Patient Name: Date of Service: [name] RD L. 04/10/2021 12:30 PM Medical Record Number: [id number] Patient Account Number: [id number] Date of Birth/Sex: Treating RN: Sep 24, 1956 (65 y.o. [name] Primary Care Provider: [name] Other Clinician: Referring Provider: Treating Provider/Extender: [name] in Treatment: 8 History of Present Illness HPI Description: Admission 5/2 Mr. Heap is a 65 year old male with a past medical history of systolic congestive heart failure, COPD, nonischemic cardiomyopathy, type 2 diabetes, chronic venous insufficiency that presents to the clinic for a 32-month history of nonhealing wound to his left lower extremity. He states that in November 2021 he fell off his porch and Hit his leg against a brick. The wound has healed mostly except for 1 area that is still open. He reports moderate Serosanguineous drainage daily. He has been keeping it covered with Band-Aids. He reports 2 rounds of antibiotics for this issue. He denies pain. He denies current acute signs of infection. 5/9; patient presents for 1 week follow-up. He has been using Hydrofera Blue under 3 layer compression. He has no issues or complaints today. He denies signs of infection. 5/16; patient presents for 1 week follow-up. He has been using Hydrofera Blue under 3 layer compression. He has no issues or complaints today. He denies signs of infection. He states he overall feels well 5/23; patient presents for 1 week follow-up. He has been using  Hydrofera Blue under 3 layer compression. He has no issues or complaints today. He denies signs of infection. 6/6; patient presents for 2-week follow-up. He has been using Hydrofera Blue under 3 layer compression. He has had no issues with this. He denies any signs of infection and has no complaints today. Readmissiondifferent wound 6/27 patient developed another wound to  his right lower extremity. He states that 1 week ago he had a large admission for a different wound Paining that was hung over his couch fell on his leg. He has been using alcohol and keeping the area covered. He denies signs of infection. Electronic Signature(s) Signed: 04/10/2021 1:44:54 PM By: [name] Entered By: Kalman Shan on 04/10/2021 13:42:03 -------------------------------------------------------------------------------- Physical Exam Details Patient Name: Date of Service: [name] RD L. 04/10/2021 12:30 PM Medical Record Number: [id number] Patient Account Number: [id number] Date of Birth/Sex: Treating RN: [birth year] (64 y.o. [name] Primary Care Provider: [name] Other Clinician: Referring Provider: Treating Provider/Extender: [name] in Treatment: 8 Constitutional respirations regular, non-labored and within target range for patient.. Cardiovascular 2+ dorsalis pedis/posterior tibialis pulses. Psychiatric pleasant and cooperative. Notes Right lower extremity: Skin tear with nonviable tissue present. No obvious signs of infection. Electronic Signature(s) Signed: 04/10/2021 1:44:54 PM By: [name] Entered By: Kalman Shan on 04/10/2021 13:42:42 -------------------------------------------------------------------------------- Physician Orders Details Patient Name: Date of Service: [name], [name] 04/10/2021 12:30 PM Medical Record Number: [id number] Patient Account Number: [id number] Date of Birth/Sex: Treating RN: [birth year] (65  y.o. [name] Primary Care Provider: [name] Other Clinician: Referring Provider: Treating Provider/Extender: [name] in Treatment: 8 Verbal / Phone Orders: No Diagnosis Coding ICD-10 Coding Code Description [phone] Laceration without foreign body, right lower leg, initial encounter I87.2 Venous insufficiency (chronic) (peripheral) I10 Essential (primary) hypertension I42.8 Other cardiomyopathies J44.9 Chronic obstructive pulmonary disease, unspecified I48.19 Other persistent atrial fibrillation E11.9 Type 2 diabetes mellitus without complications D62.22 Chronic systolic (congestive) heart failure Follow-up Appointments ppointment in 1 week. - with Dr. Heber [place] Return A Bathing/ Shower/ Hygiene May shower with protection but do not get wound dressing(s) wet. Edema Control - Lymphedema / SCD / Other Bilateral Lower Extremities Elevate legs to the level of the heart or above for 30 minutes daily and/or when sitting, a frequency of: - throughout the day Avoid standing for long periods of time. Exercise regularly Compression stocking or Garment 20-30 mm/Hg pressure to: - left leg daily Additional Orders / Instructions Follow Nutritious Diet Wound Treatment Wound #2 - Lower Leg Wound Laterality: Right, Anterior Cleanser: Normal Saline 1 x Per Week Discharge Instructions: Cleanse the wound with Normal Saline prior to applying a clean dressing using gauze sponges, not tissue or cotton balls. Cleanser: Soap and Water 1 x Per Week Discharge Instructions: May shower and wash wound with dial antibacterial soap and water prior to dressing change. Peri-Wound Care: Triamcinolone 15 (g) 1 x Per Week Discharge Instructions: Use triamcinolone 15 (g) as directed Peri-Wound Care: Sween Lotion (Moisturizing lotion) 1 x Per Week Discharge Instructions: Apply moisturizing lotion as directed Prim Dressing: Hydrofera Blue Ready Foam, 2.5 x2.5 in 1 x Per  Week ary Discharge Instructions: Apply to wound bed as instructed Prim Dressing: Santyl Ointment ary 1 x Per Week Discharge Instructions: Apply nickel thick amount to wound bed as instructed Secondary Dressing: Woven Gauze Sponge, Non-Sterile 4x4 in 1 x Per Week Discharge Instructions: Apply over primary dressing as directed. Secondary Dressing: ABD Pad, 5x9 1 x Per Week Discharge Instructions: Apply over primary dressing as directed. Compression Wrap: Kerlix Roll 4.5x3.1 (in/yd) 1 x Per Week Discharge Instructions: Apply Kerlix and Coban compression as directed. Compression Wrap: Coban Self-Adherent Wrap 4x5 (in/yd) 1 x Per Week Discharge Instructions: Apply over Kerlix as directed. Electronic Signature(s) Signed: 04/10/2021 1:44:54 PM By: [name] Previous Signature: 04/10/2021  12:52:04 PM Version By: [name] Entered By: Kalman Shan on 04/10/2021 13:43:01 -------------------------------------------------------------------------------- Problem List Details Patient Name: Date of Service: [name], [name] 04/10/2021 12:30 PM Medical Record Number: [id number] Patient Account Number: [id number] Date of Birth/Sex: Treating RN: November 18, 1955 (65 y.o. [name] Primary Care Provider: [name] Other Clinician: Referring Provider: Treating Provider/Extender: [name] in Treatment: 8 Active Problems ICD-10 Encounter Code Description Active Date MDM Diagnosis [phone] Laceration without foreign body, right lower leg, initial encounter 04/10/2021 No Yes I87.2 Venous insufficiency (chronic) (peripheral) 02/13/2021 No Yes I10 Essential (primary) hypertension 02/13/2021 No Yes I42.8 Other cardiomyopathies 02/13/2021 No Yes J44.9 Chronic obstructive pulmonary disease, unspecified 02/13/2021 No Yes I48.19 Other persistent atrial fibrillation 02/13/2021 No Yes E11.9 Type 2 diabetes mellitus without complications 0/10/7508 No Yes C58.52 Chronic  systolic (congestive) heart failure 02/13/2021 No Yes Inactive Problems Resolved Problems ICD-10 Code Description Active Date Resolved Date L97.829 Non-pressure chronic ulcer of other part of left lower leg with unspecified severity 02/13/2021 02/13/2021 Electronic Signature(s) Signed: 04/10/2021 1:44:54 PM By: [name] Previous Signature: 04/10/2021 12:51:40 PM Version By: [name] Entered By: Kalman Shan on 04/10/2021 13:40:21 -------------------------------------------------------------------------------- Progress Note Details Patient Name: Date of Service: [name] RD L. 04/10/2021 12:30 PM Medical Record Number: [id number] Patient Account Number: [id number] Date of Birth/Sex: Treating RN: 1956-05-26 (65 y.o. [name] Primary Care Provider: [name] Other Clinician: Referring Provider: Treating Provider/Extender: [name] in Treatment: 8 Subjective Chief Complaint Information obtained from Patient right lower extremity wound History of Present Illness (HPI) Admission 5/2 Mr. Pletz is a 65 year old male with a past medical history of systolic congestive heart failure, COPD, nonischemic cardiomyopathy, type 2 diabetes, chronic venous insufficiency that presents to the clinic for a 65-month history of nonhealing wound to his left lower extremity. He states that in November 2021 he fell off his porch and Hit his leg against a brick. The wound has healed mostly except for 1 area that is still open. He reports moderate Serosanguineous drainage daily. He has been keeping it covered with Band-Aids. He reports 2 rounds of antibiotics for this issue. He denies pain. He denies current acute signs of infection. 5/9; patient presents for 1 week follow-up. He has been using Hydrofera Blue under 3 layer compression. He has no issues or complaints today. He denies signs of infection. 5/16; patient presents for 1 week follow-up. He has been  using Hydrofera Blue under 3 layer compression. He has no issues or complaints today. He denies signs of infection. He states he overall feels well 5/23; patient presents for 1 week follow-up. He has been using Hydrofera Blue under 3 layer compression. He has no issues or complaints today. He denies signs of infection. 6/6; patient presents for 2-week follow-up. He has been using Hydrofera Blue under 3 layer compression. He has had no issues with this. He denies any signs of infection and has no complaints today. Readmissionoodifferent wound 6/27 patient developed another wound to his right lower extremity. He states that 1 week ago he had a large admission for a different wound Paining that was hung over his couch fell on his leg. He has been using alcohol and keeping the area covered. He denies signs of infection. Patient History Information obtained from Patient. Family History Heart Disease - Father, Hypertension - Mother,Father,Siblings, Lung Disease - Mother, Thyroid Problems - Mother, No family history of Cancer, Diabetes, Hereditary Spherocytosis, Kidney Disease, Seizures, Stroke, Tuberculosis. Social History  Former smoker - quit 2009, Marital Status - Divorced, Alcohol Use - Never, Drug Use - No History, Caffeine Use - Daily - coffee, diet soda. Medical History Eyes Patient has history of Cataracts Denies history of Glaucoma, Optic Neuritis Hematologic/Lymphatic Patient has history of Anemia Respiratory Patient has history of Chronic Obstructive Pulmonary Disease (COPD), Sleep Apnea - sleeps in recliner Denies history of Asthma, Pneumothorax Cardiovascular Patient has history of Arrhythmia - afib, Congestive Heart Failure, Deep Vein Thrombosis, Myocardial Infarction, Peripheral Venous Disease Endocrine Patient has history of Type II Diabetes Denies history of Type I Diabetes Genitourinary Denies history of End Stage Renal Disease Integumentary (Skin) Denies history of  History of Burn Musculoskeletal Patient has history of Osteoarthritis Oncologic Denies history of Received Chemotherapy, Received Radiation Psychiatric Denies history of Anorexia/bulimia, Confinement Anxiety Medical A Surgical History Notes nd Constitutional Symptoms (General Health) obesity Cardiovascular cardiomyopathy, pacemaker/defibulator Gastrointestinal GERD Endocrine thyrotoxicosis Genitourinary hx kidney stones, BPH Musculoskeletal thoracic and lumbosacral spondylosis Objective Constitutional respirations regular, non-labored and within target range for patient.. Vitals Time Taken: 12:34 PM, Height: 70 in, Weight: 260 lbs, BMI: 37.3, Temperature: 97.8 F, Pulse: 78 bpm, Respiratory Rate: 20 breaths/min, Blood Pressure: 90/62 mmHg, Pulse Oximetry: 94 %. Cardiovascular 2+ dorsalis pedis/posterior tibialis pulses. Psychiatric pleasant and cooperative. General Notes: Right lower extremity: Skin tear with nonviable tissue present. No obvious signs of infection. Integumentary (Hair, Skin) Wound #2 status is Open. Original cause of wound was Skin T ear/Laceration. The date acquired was: 04/03/2021. The wound is located on the Right,Anterior Lower Leg. The wound measures 2cm length x 3.5cm width x 0.1cm depth; 5.498cm^2 area and 0.55cm^3 volume. There is Fat Layer (Subcutaneous Tissue) exposed. There is no tunneling or undermining noted. There is a medium amount of serosanguineous drainage noted. The wound margin is flat and intact. There is large (67-100%) red granulation within the wound bed. There is no necrotic tissue within the wound bed. Assessment Active Problems ICD-10 Laceration without foreign body, right lower leg, initial encounter Venous insufficiency (chronic) (peripheral) Essential (primary) hypertension Other cardiomyopathies Chronic obstructive pulmonary disease, unspecified Other persistent atrial fibrillation Type 2 diabetes mellitus without  complications Chronic systolic (congestive) heart failure Mr. Schaller is well-known to me. He had a wound to his left lower extremity that was healed and continues to be closed. He has now developed another wound to his right lower extremity from a trauma caused by a large painting. I removed some of the nonviable tissue today. I think he would benefit from [facility] and light compression therapy. Procedures Wound #2 Pre-procedure diagnosis of Wound #2 is a Skin T located on the Right,Anterior Lower Leg . There was a Excisional Skin/Subcutaneous Tissue Debridement ear with a total area of 2 sq cm performed by Kalman Shan, DO. With the following instrument(s): Forceps, and Scissors to remove Non-Viable tissue/material. Material removed includes Subcutaneous Tissue and Skin: Dermis and after achieving pain control using Lidocaine 4% Topical Solution. No specimens were taken. A time out was conducted at 13:02, prior to the start of the procedure. A Minimum amount of bleeding was controlled with Pressure. The procedure was tolerated well. Post Debridement Measurements: 2cm length x 3.5cm width x 0.1cm depth; 0.55cm^3 volume. Character of Wound/Ulcer Post Debridement is stable. Post procedure Diagnosis Wound #2: Same as Pre-Procedure Plan Follow-up Appointments: Return Appointment in 1 week. - with Dr. Heber [place] Bathing/ Shower/ Hygiene: May shower with protection but do not get wound dressing(s) wet. Edema Control - Lymphedema / SCD / Other: Elevate legs  to the level of the heart or above for 30 minutes daily and/or when sitting, a frequency of: - throughout the day Avoid standing for long periods of time. Exercise regularly Compression stocking or Garment 20-30 mm/Hg pressure to: - left leg daily Additional Orders / Instructions: Follow Nutritious Diet WOUND #2: - Lower Leg Wound Laterality: Right, Anterior Cleanser: Normal Saline 1 x Per Week/ Discharge Instructions: Cleanse  the wound with Normal Saline prior to applying a clean dressing using gauze sponges, not tissue or cotton balls. Cleanser: Soap and Water 1 x Per Week/ Discharge Instructions: May shower and wash wound with dial antibacterial soap and water prior to dressing change. Peri-Wound Care: Triamcinolone 15 (g) 1 x Per Week/ Discharge Instructions: Use triamcinolone 15 (g) as directed Peri-Wound Care: Sween Lotion (Moisturizing lotion) 1 x Per Week/ Discharge Instructions: Apply moisturizing lotion as directed Prim Dressing: Hydrofera Blue Ready Foam, 2.5 x2.5 in 1 x Per Week/ ary Discharge Instructions: Apply to wound bed as instructed Prim Dressing: Santyl Ointment 1 x Per Week/ ary Discharge Instructions: Apply nickel thick amount to wound bed as instructed Secondary Dressing: Woven Gauze Sponge, Non-Sterile 4x4 in 1 x Per Week/ Discharge Instructions: Apply over primary dressing as directed. Secondary Dressing: ABD Pad, 5x9 1 x Per Week/ Discharge Instructions: Apply over primary dressing as directed. Com pression Wrap: Kerlix Roll 4.5x3.1 (in/yd) 1 x Per Week/ Discharge Instructions: Apply Kerlix and Coban compression as directed. Com pression Wrap: Coban Self-Adherent Wrap 4x5 (in/yd) 1 x Per Week/ Discharge Instructions: Apply over Kerlix as directed. 1. In office sharp debridement 2. Hydrofera Blue and Santyl under Kerlix/Coban 3. Follow-up in 1 week Electronic Signature(s) Signed: 04/10/2021 1:44:54 PM By: [name] Entered By: Kalman Shan on 04/10/2021 13:43:54 -------------------------------------------------------------------------------- HxROS Details Patient Name: Date of Service: [name], [name] 04/10/2021 12:30 PM Medical Record Number: [id number] Patient Account Number: [id number] Date of Birth/Sex: Treating RN: 09-13-1956 (65 y.o. [name] Primary Care Provider: [name] Other Clinician: Referring Provider: Treating Provider/Extender: [name] in Treatment: 8 Information Obtained From Patient Constitutional Symptoms (General Health) Medical History: Past Medical History Notes: obesity Eyes Medical History: Positive for: Cataracts Negative for: Glaucoma; Optic Neuritis Hematologic/Lymphatic Medical History: Positive for: Anemia Respiratory Medical History: Positive for: Chronic Obstructive Pulmonary Disease (COPD); Sleep Apnea - sleeps in recliner Negative for: Asthma; Pneumothorax Cardiovascular Medical History: Positive for: Arrhythmia - afib; Congestive Heart Failure; Deep Vein Thrombosis; Myocardial Infarction; Peripheral Venous Disease Past Medical History Notes: cardiomyopathy, pacemaker/defibulator Gastrointestinal Medical History: Past Medical History Notes: GERD Endocrine Medical History: Positive for: Type II Diabetes Negative for: Type I Diabetes Past Medical History Notes: thyrotoxicosis Time with diabetes: 10 yrs Treated with: Diet Blood sugar tested every day: No Genitourinary Medical History: Negative for: End Stage Renal Disease Past Medical History Notes: hx kidney stones, BPH Integumentary (Skin) Medical History: Negative for: History of Burn Musculoskeletal Medical History: Positive for: Osteoarthritis Past Medical History Notes: thoracic and lumbosacral spondylosis Oncologic Medical History: Negative for: Received Chemotherapy; Received Radiation Psychiatric Medical History: Negative for: Anorexia/bulimia; Confinement Anxiety HBO Extended History Items Eyes: Cataracts Immunizations Pneumococcal Vaccine: Received Pneumococcal Vaccination: Yes Implantable Devices Yes Family and Social History Cancer: No; Diabetes: No; Heart Disease: Yes - Father; Hereditary Spherocytosis: No; Hypertension: Yes - Mother,Father,Siblings; Kidney Disease: No; Lung Disease: Yes - Mother; Seizures: No; Stroke: No; Thyroid Problems: Yes - Mother; Tuberculosis: No; Former  smoker - quit 2009; Marital Status - Divorced; Alcohol Use: Never; Drug Use: No  History; Caffeine Use: Daily - coffee, diet soda; Financial Concerns: No; Food, Clothing or Shelter Needs: No; Support System Lacking: No; Transportation Concerns: No Electronic Signature(s) Signed: 04/10/2021 1:44:54 PM By: [name] Signed: 04/10/2021 5:08:20 PM By: [name] Entered By: Kalman Shan on 04/10/2021 13:42:11 -------------------------------------------------------------------------------- SuperBill Details Patient Name: Date of Service: [name] RD L. 04/10/2021 Medical Record Number: [id number] Patient Account Number: [id number] Date of Birth/Sex: Treating RN: 02/22/1956 (65 y.o. [name] Primary Care Provider: [name] Other Clinician: Referring Provider: Treating Provider/Extender: [name] in Treatment: 8 Diagnosis Coding ICD-10 Codes Code Description [phone] Laceration without foreign body, right lower leg, initial encounter I87.2 Venous insufficiency (chronic) (peripheral) I10 Essential (primary) hypertension I42.8 Other cardiomyopathies J44.9 Chronic obstructive pulmonary disease, unspecified I48.19 Other persistent atrial fibrillation E11.9 Type 2 diabetes mellitus without complications Z00.92 Chronic systolic (congestive) heart failure Facility Procedures CPT4 Code: [id number] Description: 63335 - DEB SUBQ TISSUE 20 SQ CM/< ICD-10 Diagnosis Description S81.811A Laceration without foreign body, right lower leg, initial encounter I87.2 Venous insufficiency (chronic) (peripheral) Modifier: Quantity: 1 Physician Procedures : CPT4 Code Description Modifier [id number] - WC PHYS SUBQ TISS 20 SQ CM ICD-10 Diagnosis Description S81.811A Laceration without foreign body, right lower leg, initial encounter I87.2 Venous insufficiency (chronic) (peripheral) Quantity: 1 Electronic Signature(s) Signed: 04/10/2021 1:44:54 PM By:  [name] Entered By: Kalman Shan on 04/10/2021 13:44:22

## 2021-04-11 ENCOUNTER — Ambulatory Visit (INDEPENDENT_AMBULATORY_CARE_PROVIDER_SITE_OTHER): Payer: Medicare HMO

## 2021-04-11 ENCOUNTER — Telehealth: Payer: Self-pay

## 2021-04-11 ENCOUNTER — Ambulatory Visit (INDEPENDENT_AMBULATORY_CARE_PROVIDER_SITE_OTHER): Payer: Medicare HMO | Admitting: General Practice

## 2021-04-11 DIAGNOSIS — Z95810 Presence of automatic (implantable) cardiac defibrillator: Secondary | ICD-10-CM

## 2021-04-11 DIAGNOSIS — I5032 Chronic diastolic (congestive) heart failure: Secondary | ICD-10-CM

## 2021-04-11 DIAGNOSIS — Z7901 Long term (current) use of anticoagulants: Secondary | ICD-10-CM

## 2021-04-11 LAB — POCT INR: INR: 1.7 — AB (ref 2.0–3.0)

## 2021-04-11 NOTE — Patient Instructions (Addendum)
Pre visit review using our clinic review tool, if applicable. No additional management support is needed unless otherwise documented below in the visit note.  Take 1 tablet today and then change dosage and take 1/2 tablet daily except take 1 tablet on Mondays Wednesdays and Friday. Re-check INR in 4 weeks.  Patient advised to stop taking ibuprofen.

## 2021-04-11 NOTE — Telephone Encounter (Signed)
Attempted call back to patient per voice mail request regarding his remote monitor is working and would like to know when to send remote transmission.  Left message to send remote transmission today with instructions on manual send.  Advised when report is received and reviewed, will call him back.

## 2021-04-11 NOTE — Progress Notes (Signed)
Medical screening examination/treatment/procedure(s) were performed by non-physician practitioner and as supervising physician I was immediately available for consultation/collaboration. I agree with above. Jaxtin Raimondo, MD   

## 2021-04-13 ENCOUNTER — Other Ambulatory Visit: Payer: Self-pay | Admitting: Internal Medicine

## 2021-04-13 DIAGNOSIS — I4819 Other persistent atrial fibrillation: Secondary | ICD-10-CM

## 2021-04-13 DIAGNOSIS — I428 Other cardiomyopathies: Secondary | ICD-10-CM

## 2021-04-13 DIAGNOSIS — I255 Ischemic cardiomyopathy: Secondary | ICD-10-CM

## 2021-04-14 NOTE — Progress Notes (Signed)
EPIC Encounter for ICM Monitoring  Patient Name: [name] is a 65 y.o. male Date: 04/14/2021 Primary Care Physican: [name], MD Primary Cardiologist: [name] Electrophysiologist: Vergie Living Pacing: 100%    04/14/2021 Weight: 249.3 lbs     Spoke with patient and heart failure questions reviewed.  Pt asymptomatic for fluid accumulation and feeing well.  He did have leg swelling during decreased impedance and took extra Torsemide.    Optivol thoracic impedance normal for past 2 days but was suggesting possible fluid accumulation from 5/28-6/23.     Prescribed:  Torsemide 20 mg to 4 tablets (80 mg total) and 3 tablets (60 mg total) every evening.   Potassium 20 mEq take 1 tablet daily Spironolactone 25 mg take 1 tablet daily.   Labs: 03/28/2021 Creatinine 1.34, BUN 26, Potassium 4.2, Sodium 136 02/28/2021 Creatinine 1.29, BUN 30, Potassium 4.2, Sodium 138, GFR 57.90 02/07/2021 Creatinine 1.23, BUN 21, Potassium 4.5, Sodium 137, GFR >60 01/31/2021 Creatinine 1.27, BUN 24, Potassium 4.1, Sodium 136, GFR >60 01/23/2021 Creatinine 1.14, BUN 26, Potassium 4.4, Sodium 138, GFR >60 12/20/2020 Creatinine 1.43, BUN 42, Potassium 4.0, Sodium 135, GFR 54  A complete set of results can be found in Results Review.   Recommendations:  Recommendation to limit salt intake to 2000 mg daily and fluid intake to 64 oz daily.  Encouraged to call if experiencing any fluid symptoms.    Follow-up plan: ICM clinic phone appointment on 05/15/2021.   91 day device clinic remote transmission 04/26/2021   EP/Cardiology Office Visits:  06/28/2021 with [facility] PA/NP.     Copy of ICM check sent to Dr. [name]   3 month ICM trend: 04/11/2021.    1 Year ICM trend:       [name], RN 04/14/2021 1:03 PM

## 2021-04-18 ENCOUNTER — Telehealth: Payer: Medicare HMO

## 2021-04-18 ENCOUNTER — Telehealth: Payer: Self-pay | Admitting: *Deleted

## 2021-04-18 ENCOUNTER — Encounter (HOSPITAL_BASED_OUTPATIENT_CLINIC_OR_DEPARTMENT_OTHER): Payer: Medicare HMO | Attending: Internal Medicine | Admitting: Internal Medicine

## 2021-04-18 ENCOUNTER — Other Ambulatory Visit: Payer: Self-pay

## 2021-04-18 ENCOUNTER — Encounter: Payer: Self-pay | Admitting: *Deleted

## 2021-04-18 DIAGNOSIS — E11622 Type 2 diabetes mellitus with other skin ulcer: Secondary | ICD-10-CM | POA: Insufficient documentation

## 2021-04-18 DIAGNOSIS — S81811A Laceration without foreign body, right lower leg, initial encounter: Secondary | ICD-10-CM

## 2021-04-18 DIAGNOSIS — I4819 Other persistent atrial fibrillation: Secondary | ICD-10-CM | POA: Diagnosis not present

## 2021-04-18 DIAGNOSIS — I872 Venous insufficiency (chronic) (peripheral): Secondary | ICD-10-CM | POA: Diagnosis not present

## 2021-04-18 DIAGNOSIS — L97829 Non-pressure chronic ulcer of other part of left lower leg with unspecified severity: Secondary | ICD-10-CM | POA: Insufficient documentation

## 2021-04-18 DIAGNOSIS — I110 Hypertensive heart disease with heart failure: Secondary | ICD-10-CM | POA: Diagnosis not present

## 2021-04-18 DIAGNOSIS — I5022 Chronic systolic (congestive) heart failure: Secondary | ICD-10-CM | POA: Insufficient documentation

## 2021-04-18 NOTE — Telephone Encounter (Signed)
  Chronic Care Management   Follow Up Note   04/18/2021 Name: [name] MRN: [id number] DOB: 1956-10-12   Referred by: [name], MD Reason for referral : Chronic Care Management (CCM RN CM Initial Telephone Outreach, Unsuccessful)  An unsuccessful telephone outreach was attempted today. The patient was referred to the case management team for assistance with care management and care coordination.   Follow Up Plan:  A HIPPA compliant phone message was left for the patient providing contact information and requesting a return call Will place request for scheduling care guide to contact patient to re-schedule today's missed telephone appointment  [name], RN, BSN, [place] [phone]: direct office [phone]: mobile

## 2021-04-18 NOTE — Progress Notes (Signed)
LAMARKUS, NEBEL ([id number]) Visit Report for 04/18/2021 Chief Complaint Document Details Patient Name: Date of Service: [name] RD L. 04/18/2021 8:15 A M Medical Record Number: [id number] Patient Account Number: [id number] Date of Birth/Sex: Treating RN: 08-13-1956 (65 y.o. [name] Primary Care Provider: [name] Other Clinician: Referring Provider: Treating Provider/Extender: [name] in Treatment: 9 Information Obtained from: Patient Chief Complaint right lower extremity wound Electronic Signature(s) Signed: 04/18/2021 9:12:07 AM By: [name] Entered By: Kalman Shan on 04/18/2021 09:09:03 -------------------------------------------------------------------------------- Debridement Details Patient Name: Date of Service: [name] RD L. 04/18/2021 8:15 A M Medical Record Number: [id number] Patient Account Number: [id number] Date of Birth/Sex: Treating RN: 1956-01-25 (65 y.o. [name], Meta.Reding Primary Care Provider: [name] Other Clinician: Referring Provider: Treating Provider/Extender: [name] in Treatment: 9 Debridement Performed for Assessment: Wound #2 Right,Anterior Lower Leg Performed By: Physician Kalman Shan, DO Debridement Type: Debridement Level of Consciousness (Pre-procedure): Awake and Alert Pre-procedure Verification/Time Out Yes - 08:35 Taken: Start Time: 08:36 Pain Control: Lidocaine 4% T opical Solution T Area Debrided (L x W): otal 2.3 (cm) x 3 (cm) = 6.9 (cm) Tissue and other material debrided: Viable, Non-Viable, Slough, Subcutaneous, Skin: Dermis , Fibrin/Exudate, Slough Level: Skin/Subcutaneous Tissue Debridement Description: Excisional Instrument: Curette Bleeding: Minimum Hemostasis Achieved: Pressure End Time: 08:42 Procedural Pain: 0 Post Procedural Pain: 0 Response to Treatment: Procedure was tolerated well Level of Consciousness (Post- Awake and  Alert procedure): Post Debridement Measurements of Total Wound Length: (cm) 2.3 Width: (cm) 3 Depth: (cm) 0.1 Volume: (cm) 0.542 Character of Wound/Ulcer Post Debridement: Requires Further Debridement Post Procedure Diagnosis Same as Pre-procedure Electronic Signature(s) Signed: 04/18/2021 9:12:07 AM By: [name] Signed: 04/18/2021 6:55:28 PM By: Deon Pilling Entered By: Deon Pilling on 04/18/2021 08:43:01 -------------------------------------------------------------------------------- HPI Details Patient Name: Date of Service: [name] RD L. 04/18/2021 8:15 A M Medical Record Number: [id number] Patient Account Number: [id number] Date of Birth/Sex: Treating RN: 1956/04/26 (65 y.o. [name] Primary Care Provider: [name] Other Clinician: Referring Provider: Treating Provider/Extender: [name] in Treatment: 9 History of Present Illness HPI Description: Admission 5/2 Mr. Else is a 65 year old male with a past medical history of systolic congestive heart failure, COPD, nonischemic cardiomyopathy, type 2 diabetes, chronic venous insufficiency that presents to the clinic for a 56-month history of nonhealing wound to his left lower extremity. He states that in November 2021 he fell off his porch and Hit his leg against a brick. The wound has healed mostly except for 1 area that is still open. He reports moderate Serosanguineous drainage daily. He has been keeping it covered with Band-Aids. He reports 2 rounds of antibiotics for this issue. He denies pain. He denies current acute signs of infection. 5/9; patient presents for 1 week follow-up. He has been using Hydrofera Blue under 3 layer compression. He has no issues or complaints today. He denies signs of infection. 5/16; patient presents for 1 week follow-up. He has been using Hydrofera Blue under 3 layer compression. He has no issues or complaints today. He denies signs of infection. He  states he overall feels well 5/23; patient presents for 1 week follow-up. He has been using Hydrofera Blue under 3 layer compression. He has no issues or complaints today. He denies signs of infection. 6/6; patient presents for 2-week follow-up. He has been using Hydrofera Blue under 3 layer compression. He has had no issues with this. He denies  any signs of infection and has no complaints today. Readmissiondifferent wound 6/27 patient developed another wound to his right lower extremity. He states that 1 week ago he had a large admission for a different wound Paining that was hung over his couch fell on his leg. He has been using alcohol and keeping the area covered. He denies signs of infection. 7/5; patient presents for 1 week follow-up. He has been tolerating Hydrofera Blue under 3 layer compression. He does report some pain to the wound site. He denies signs of infection. Electronic Signature(s) Signed: 04/18/2021 9:12:07 AM By: [name] Entered By: Kalman Shan on 04/18/2021 09:09:36 -------------------------------------------------------------------------------- Physical Exam Details Patient Name: Date of Service: [name] RD L. 04/18/2021 8:15 A M Medical Record Number: [id number] Patient Account Number: [id number] Date of Birth/Sex: Treating RN: Aug 07, 1956 (65 y.o. [name] Primary Care Provider: [name] Other Clinician: Referring Provider: Treating Provider/Extender: [name] in Treatment: 9 Constitutional respirations regular, non-labored and within target range for patient.. Cardiovascular 2+ dorsalis pedis/posterior tibialis pulses. Psychiatric pleasant and cooperative. Notes Right lower extremity: Open wound to the anterior shin with nonviable tissue and granulation tissue present. No increased warmth erythema or tenderness to palpation. No purulent drainage. Electronic Signature(s) Signed: 04/18/2021 9:12:07 AM By:  [name] Entered By: Kalman Shan on 04/18/2021 09:10:18 -------------------------------------------------------------------------------- Physician Orders Details Patient Name: Date of Service: [name] RD L. 04/18/2021 8:15 A M Medical Record Number: [id number] Patient Account Number: [id number] Date of Birth/Sex: Treating RN: 1956-02-24 (65 y.o. [name], Meta.Reding Primary Care Provider: [name] Other Clinician: Referring Provider: Treating Provider/Extender: [name] in Treatment: 9 Verbal / Phone Orders: No Diagnosis Coding ICD-10 Coding Code Description [phone] Laceration without foreign body, right lower leg, initial encounter I87.2 Venous insufficiency (chronic) (peripheral) I10 Essential (primary) hypertension I42.8 Other cardiomyopathies J44.9 Chronic obstructive pulmonary disease, unspecified I48.19 Other persistent atrial fibrillation E11.9 Type 2 diabetes mellitus without complications S85.46 Chronic systolic (congestive) heart failure Follow-up Appointments ppointment in 1 week. - with Dr. Heber [place] Monday Return A Bathing/ Shower/ Hygiene May shower with protection but do not get wound dressing(s) wet. Edema Control - Lymphedema / SCD / Other Bilateral Lower Extremities Elevate legs to the level of the heart or above for 30 minutes daily and/or when sitting, a frequency of: - throughout the day Avoid standing for long periods of time. Exercise regularly Moisturize legs daily. - left leg every night before bed. Compression stocking or Garment 20-30 mm/Hg pressure to: - left leg daily Additional Orders / Instructions Follow Nutritious Diet Wound Treatment Wound #2 - Lower Leg Wound Laterality: Right, Anterior Cleanser: Normal Saline 1 x Per Week Discharge Instructions: Cleanse the wound with Normal Saline prior to applying a clean dressing using gauze sponges, not tissue or cotton balls. Cleanser: Soap and Water 1 x Per  Week Discharge Instructions: May shower and wash wound with dial antibacterial soap and water prior to dressing change. Peri-Wound Care: Triamcinolone 15 (g) 1 x Per Week Discharge Instructions: Use triamcinolone 15 (g) as directed Peri-Wound Care: Sween Lotion (Moisturizing lotion) 1 x Per Week Discharge Instructions: Apply moisturizing lotion as directed Prim Dressing: Hydrofera Blue Ready Foam, 2.5 x2.5 in 1 x Per Week ary Discharge Instructions: Apply to wound bed as instructed Prim Dressing: Santyl Ointment 1 x Per Week ary Discharge Instructions: Apply nickel thick amount to wound bed as instructed Secondary Dressing: Woven Gauze Sponge, Non-Sterile 4x4 in 1 x Per Week Discharge  Instructions: Apply over primary dressing as directed. Secondary Dressing: ABD Pad, 5x9 1 x Per Week Discharge Instructions: Apply over primary dressing as directed. Compression Wrap: Kerlix Roll 4.5x3.1 (in/yd) 1 x Per Week Discharge Instructions: Apply Kerlix and Coban compression as directed. Compression Wrap: Coban Self-Adherent Wrap 4x5 (in/yd) 1 x Per Week Discharge Instructions: Apply over Kerlix as directed. Electronic Signature(s) Signed: 04/18/2021 9:12:07 AM By: [name] Entered By: Kalman Shan on 04/18/2021 09:10:32 -------------------------------------------------------------------------------- Problem List Details Patient Name: Date of Service: [name], [name] 04/18/2021 8:15 A M Medical Record Number: [id number] Patient Account Number: [id number] Date of Birth/Sex: Treating RN: 1956-04-22 (65 y.o. [name] Primary Care Provider: [name] Other Clinician: Referring Provider: Treating Provider/Extender: [name] in Treatment: 9 Active Problems ICD-10 Encounter Code Description Active Date MDM Diagnosis S81.811A Laceration without foreign body, right lower leg, initial encounter 04/10/2021 No Yes I87.2 Venous insufficiency (chronic)  (peripheral) 02/13/2021 No Yes I10 Essential (primary) hypertension 02/13/2021 No Yes I42.8 Other cardiomyopathies 02/13/2021 No Yes J44.9 Chronic obstructive pulmonary disease, unspecified 02/13/2021 No Yes I48.19 Other persistent atrial fibrillation 02/13/2021 No Yes E11.9 Type 2 diabetes mellitus without complications 4/0/9811 No Yes B14.78 Chronic systolic (congestive) heart failure 02/13/2021 No Yes Inactive Problems Resolved Problems ICD-10 Code Description Active Date Resolved Date L97.829 Non-pressure chronic ulcer of other part of left lower leg with unspecified severity 02/13/2021 02/13/2021 Electronic Signature(s) Signed: 04/18/2021 9:12:07 AM By: [name] Entered By: Kalman Shan on 04/18/2021 09:08:48 -------------------------------------------------------------------------------- Progress Note Details Patient Name: Date of Service: [name] RD L. 04/18/2021 8:15 A M Medical Record Number: [id number] Patient Account Number: [id number] Date of Birth/Sex: Treating RN: May 03, 1956 (65 y.o. [name] Primary Care Provider: [name] Other Clinician: Referring Provider: Treating Provider/Extender: [name] in Treatment: 9 Subjective Chief Complaint Information obtained from Patient right lower extremity wound History of Present Illness (HPI) Admission 5/2 Mr. Ennis is a 65 year old male with a past medical history of systolic congestive heart failure, COPD, nonischemic cardiomyopathy, type 2 diabetes, chronic venous insufficiency that presents to the clinic for a 78-month history of nonhealing wound to his left lower extremity. He states that in November 2021 he fell off his porch and Hit his leg against a brick. The wound has healed mostly except for 1 area that is still open. He reports moderate Serosanguineous drainage daily. He has been keeping it covered with Band-Aids. He reports 2 rounds of antibiotics for this issue. He denies pain.  He denies current acute signs of infection. 5/9; patient presents for 1 week follow-up. He has been using Hydrofera Blue under 3 layer compression. He has no issues or complaints today. He denies signs of infection. 5/16; patient presents for 1 week follow-up. He has been using Hydrofera Blue under 3 layer compression. He has no issues or complaints today. He denies signs of infection. He states he overall feels well 5/23; patient presents for 1 week follow-up. He has been using Hydrofera Blue under 3 layer compression. He has no issues or complaints today. He denies signs of infection. 6/6; patient presents for 2-week follow-up. He has been using Hydrofera Blue under 3 layer compression. He has had no issues with this. He denies any signs of infection and has no complaints today. Readmissionoodifferent wound 6/27 patient developed another wound to his right lower extremity. He states that 1 week ago he had a large admission for a different wound Paining that was hung over his  couch fell on his leg. He has been using alcohol and keeping the area covered. He denies signs of infection. 7/5; patient presents for 1 week follow-up. He has been tolerating Hydrofera Blue under 3 layer compression. He does report some pain to the wound site. He denies signs of infection. Patient History Information obtained from Patient. Family History Heart Disease - Father, Hypertension - Mother,Father,Siblings, Lung Disease - Mother, Thyroid Problems - Mother, No family history of Cancer, Diabetes, Hereditary Spherocytosis, Kidney Disease, Seizures, Stroke, Tuberculosis. Social History Former smoker - quit 2009, Marital Status - Divorced, Alcohol Use - Never, Drug Use - No History, Caffeine Use - Daily - coffee, diet soda. Medical History Eyes Patient has history of Cataracts Denies history of Glaucoma, Optic Neuritis Hematologic/Lymphatic Patient has history of Anemia Respiratory Patient has history of  Chronic Obstructive Pulmonary Disease (COPD), Sleep Apnea - sleeps in recliner Denies history of Asthma, Pneumothorax Cardiovascular Patient has history of Arrhythmia - afib, Congestive Heart Failure, Deep Vein Thrombosis, Myocardial Infarction, Peripheral Venous Disease Endocrine Patient has history of Type II Diabetes Denies history of Type I Diabetes Genitourinary Denies history of End Stage Renal Disease Integumentary (Skin) Denies history of History of Burn Musculoskeletal Patient has history of Osteoarthritis Oncologic Denies history of Received Chemotherapy, Received Radiation Psychiatric Denies history of Anorexia/bulimia, Confinement Anxiety Medical A Surgical History Notes nd Constitutional Symptoms (General Health) obesity Cardiovascular cardiomyopathy, pacemaker/defibulator Gastrointestinal GERD Endocrine thyrotoxicosis Genitourinary hx kidney stones, BPH Musculoskeletal thoracic and lumbosacral spondylosis Objective Constitutional respirations regular, non-labored and within target range for patient.. Vitals Time Taken: 8:23 AM, Height: 70 in, Source: Stated, Weight: 260 lbs, BMI: 37.3, Temperature: 97.7 F, Pulse: 77 bpm, Respiratory Rate: 18 breaths/min, Blood Pressure: 102/66 mmHg. Cardiovascular 2+ dorsalis pedis/posterior tibialis pulses. Psychiatric pleasant and cooperative. General Notes: Right lower extremity: Open wound to the anterior shin with nonviable tissue and granulation tissue present. No increased warmth erythema or tenderness to palpation. No purulent drainage. Integumentary (Hair, Skin) Wound #2 status is Open. Original cause of wound was Skin T ear/Laceration. The date acquired was: 04/03/2021. The wound has been in treatment 1 weeks. The wound is located on the Right,Anterior Lower Leg. The wound measures 2.3cm length x 3cm width x 0.1cm depth; 5.419cm^2 area and 0.542cm^3 volume. There is Fat Layer (Subcutaneous Tissue) exposed. There  is no tunneling or undermining noted. There is a medium amount of serosanguineous drainage noted. The wound margin is flat and intact. There is medium (34-66%) red granulation within the wound bed. There is a medium (34-66%) amount of necrotic tissue within the wound bed including Adherent Slough. Assessment Active Problems ICD-10 Laceration without foreign body, right lower leg, initial encounter Venous insufficiency (chronic) (peripheral) Essential (primary) hypertension Other cardiomyopathies Chronic obstructive pulmonary disease, unspecified Other persistent atrial fibrillation Type 2 diabetes mellitus without complications Chronic systolic (congestive) heart failure Patient's wound has shown improvement in size and appearance since last clinic visit. Nonviable tissue was debrided. There were no signs of infection on exam. I recommended continuing Santyl with Hydrofera Blue under compression therapy. I will see him back again in 1 week. Procedures Wound #2 Pre-procedure diagnosis of Wound #2 is a Skin T located on the Right,Anterior Lower Leg . There was a Excisional Skin/Subcutaneous Tissue Debridement ear with a total area of 6.9 sq cm performed by Kalman Shan, DO. With the following instrument(s): Curette to remove Viable and Non-Viable tissue/material. Material removed includes Subcutaneous Tissue, Slough, Skin: Dermis, and Fibrin/Exudate after achieving pain control using Lidocaine 4% T  opical Solution. A time out was conducted at 08:35, prior to the start of the procedure. A Minimum amount of bleeding was controlled with Pressure. The procedure was tolerated well with a pain level of 0 throughout and a pain level of 0 following the procedure. Post Debridement Measurements: 2.3cm length x 3cm width x 0.1cm depth; 0.542cm^3 volume. Character of Wound/Ulcer Post Debridement requires further debridement. Post procedure Diagnosis Wound #2: Same as Pre-Procedure Plan Follow-up  Appointments: Return Appointment in 1 week. - with Dr. Heber [place] Monday Bathing/ Shower/ Hygiene: May shower with protection but do not get wound dressing(s) wet. Edema Control - Lymphedema / SCD / Other: Elevate legs to the level of the heart or above for 30 minutes daily and/or when sitting, a frequency of: - throughout the day Avoid standing for long periods of time. Exercise regularly Moisturize legs daily. - left leg every night before bed. Compression stocking or Garment 20-30 mm/Hg pressure to: - left leg daily Additional Orders / Instructions: Follow Nutritious Diet WOUND #2: - Lower Leg Wound Laterality: Right, Anterior Cleanser: Normal Saline 1 x Per Week/ Discharge Instructions: Cleanse the wound with Normal Saline prior to applying a clean dressing using gauze sponges, not tissue or cotton balls. Cleanser: Soap and Water 1 x Per Week/ Discharge Instructions: May shower and wash wound with dial antibacterial soap and water prior to dressing change. Peri-Wound Care: Triamcinolone 15 (g) 1 x Per Week/ Discharge Instructions: Use triamcinolone 15 (g) as directed Peri-Wound Care: Sween Lotion (Moisturizing lotion) 1 x Per Week/ Discharge Instructions: Apply moisturizing lotion as directed Prim Dressing: Hydrofera Blue Ready Foam, 2.5 x2.5 in 1 x Per Week/ ary Discharge Instructions: Apply to wound bed as instructed Prim Dressing: Santyl Ointment 1 x Per Week/ ary Discharge Instructions: Apply nickel thick amount to wound bed as instructed Secondary Dressing: Woven Gauze Sponge, Non-Sterile 4x4 in 1 x Per Week/ Discharge Instructions: Apply over primary dressing as directed. Secondary Dressing: ABD Pad, 5x9 1 x Per Week/ Discharge Instructions: Apply over primary dressing as directed. Com pression Wrap: Kerlix Roll 4.5x3.1 (in/yd) 1 x Per Week/ Discharge Instructions: Apply Kerlix and Coban compression as directed. Com pression Wrap: Coban Self-Adherent Wrap 4x5 (in/yd) 1 x Per  Week/ Discharge Instructions: Apply over Kerlix as directed. 1. In office sharp debridement 2. Hydrofera Blue with Santyl under Kerlix/Coban 3. Follow-up in 1 week Electronic Signature(s) Signed: 04/18/2021 9:12:07 AM By: [name] Entered By: Kalman Shan on 04/18/2021 09:11:31 -------------------------------------------------------------------------------- HxROS Details Patient Name: Date of Service: [name], [name] 04/18/2021 8:15 A M Medical Record Number: [id number] Patient Account Number: [id number] Date of Birth/Sex: Treating RN: [birth year] (64 y.o. [name] Primary Care Provider: [name] Other Clinician: Referring Provider: Treating Provider/Extender: [name] in Treatment: 9 Information Obtained From Patient Constitutional Symptoms (General Health) Medical History: Past Medical History Notes: obesity Eyes Medical History: Positive for: Cataracts Negative for: Glaucoma; Optic Neuritis Hematologic/Lymphatic Medical History: Positive for: Anemia Respiratory Medical History: Positive for: Chronic Obstructive Pulmonary Disease (COPD); Sleep Apnea - sleeps in recliner Negative for: Asthma; Pneumothorax Cardiovascular Medical History: Positive for: Arrhythmia - afib; Congestive Heart Failure; Deep Vein Thrombosis; Myocardial Infarction; Peripheral Venous Disease Past Medical History Notes: cardiomyopathy, pacemaker/defibulator Gastrointestinal Medical History: Past Medical History Notes: GERD Endocrine Medical History: Positive for: Type II Diabetes Negative for: Type I Diabetes Past Medical History Notes: thyrotoxicosis Time with diabetes: 10 yrs Treated with: Diet Blood sugar tested every day: No Genitourinary Medical History: Negative for:  End Stage Renal Disease Past Medical History Notes: hx kidney stones, BPH Integumentary (Skin) Medical History: Negative for: History of  Burn Musculoskeletal Medical History: Positive for: Osteoarthritis Past Medical History Notes: thoracic and lumbosacral spondylosis Oncologic Medical History: Negative for: Received Chemotherapy; Received Radiation Psychiatric Medical History: Negative for: Anorexia/bulimia; Confinement Anxiety HBO Extended History Items Eyes: Cataracts Immunizations Pneumococcal Vaccine: Received Pneumococcal Vaccination: Yes Implantable Devices Yes Family and Social History Cancer: No; Diabetes: No; Heart Disease: Yes - Father; Hereditary Spherocytosis: No; Hypertension: Yes - Mother,Father,Siblings; Kidney Disease: No; Lung Disease: Yes - Mother; Seizures: No; Stroke: No; Thyroid Problems: Yes - Mother; Tuberculosis: No; Former smoker - quit 2009; Marital Status - Divorced; Alcohol Use: Never; Drug Use: No History; Caffeine Use: Daily - coffee, diet soda; Financial Concerns: No; Food, Clothing or Shelter Needs: No; Support System Lacking: No; Transportation Concerns: No Electronic Signature(s) Signed: 04/18/2021 9:12:07 AM By: [name] Signed: 04/18/2021 6:55:28 PM By: Deon Pilling Entered By: Kalman Shan on 04/18/2021 09:09:43 -------------------------------------------------------------------------------- SuperBill Details Patient Name: Date of Service: [name] RD L. 04/18/2021 Medical Record Number: [id number] Patient Account Number: [id number] Date of Birth/Sex: Treating RN: [birth year] (65 y.o. [name], Meta.Reding Primary Care Provider: [name] Other Clinician: Referring Provider: Treating Provider/Extender: [name] in Treatment: 9 Diagnosis Coding ICD-10 Codes Code Description [phone] Laceration without foreign body, right lower leg, initial encounter I87.2 Venous insufficiency (chronic) (peripheral) I10 Essential (primary) hypertension I42.8 Other cardiomyopathies J44.9 Chronic obstructive pulmonary disease, unspecified I48.19  Other persistent atrial fibrillation E11.9 Type 2 diabetes mellitus without complications H21.97 Chronic systolic (congestive) heart failure Facility Procedures CPT4 Code: [id number] Description: 82641 - DEB SUBQ TISSUE 20 SQ CM/< ICD-10 Diagnosis Description S81.811A Laceration without foreign body, right lower leg, initial encounter Modifier: Quantity: 1 Physician Procedures : CPT4 Code Description Modifier [id number] - WC PHYS SUBQ TISS 20 SQ CM ICD-10 Diagnosis Description S81.811A Laceration without foreign body, right lower leg, initial encounter Quantity: 1 Electronic Signature(s) Signed: 04/18/2021 9:12:07 AM By: [name] Entered By: Kalman Shan on 04/18/2021 09:11:38

## 2021-04-18 NOTE — Progress Notes (Signed)
[name] ([id number]) Visit Report for 04/18/2021 Arrival Information Details Patient Name: Date of Service: [name] RD L. 04/18/2021 8:15 A M Medical Record Number: [id number] Patient Account Number: [id number] Date of Birth/Sex: Treating RN: August 18, 1956 (65 y.o. [name] Primary Care Numa Schroeter: [name] Other Clinician: Referring Chavela Justiniano: Treating Shina Wass/Extender: [name] in Treatment: 9 Visit Information History Since Last Visit Added or deleted any medications: No Patient Arrived: [name] Any new allergies or adverse reactions: No Arrival Time: 08:17 Had a fall or experienced change in No Accompanied By: self activities of daily living that may affect Transfer Assistance: None risk of falls: Patient Identification Verified: Yes Signs or symptoms of abuse/neglect since last visito No Secondary Verification Process Completed: Yes Hospitalized since last visit: No Patient Requires Transmission-Based Precautions: No Implantable device outside of the clinic excluding No Patient Has Alerts: Yes cellular tissue based products placed in the center Patient Alerts: R ABI= 1.33, L ABI=1.16 since last visit: Has Dressing in Place as Prescribed: Yes Has Compression in Place as Prescribed: Yes Pain Present Now: Yes Electronic Signature(s) Signed: 04/18/2021 7:10:16 PM By: Baruch Gouty RN, BSN Entered By: Baruch Gouty on 04/18/2021 75:64:33 -------------------------------------------------------------------------------- Encounter Discharge Information Details Patient Name: Date of Service: [name] RD L. 04/18/2021 8:15 A M Medical Record Number: [id number] Patient Account Number: [id number] Date of Birth/Sex: Treating RN: Dec 23, 1955 (64 y.o. [name] Primary Care Derry Arbogast: [name] Other Clinician: Referring Tanuj Mullens: Treating Adryanna Friedt/Extender: [name] in Treatment: 9 Encounter Discharge  Information Items Post Procedure Vitals Discharge Condition: Stable Temperature (F): 97.7 Ambulatory Status: Cane Pulse (bpm): 77 Discharge Destination: Home Respiratory Rate (breaths/min): 18 Transportation: Private Auto Blood Pressure (mmHg): 102/66 Accompanied By: self Schedule Follow-up Appointment: Yes Clinical Summary of Care: Patient Declined Electronic Signature(s) Signed: 04/18/2021 7:10:16 PM By: Baruch Gouty RN, BSN Entered By: Baruch Gouty on 04/18/2021 08:58:45 -------------------------------------------------------------------------------- Lower Extremity Assessment Details Patient Name: Date of Service: [name] RD L. 04/18/2021 8:15 A M Medical Record Number: [id number] Patient Account Number: [id number] Date of Birth/Sex: Treating RN: Mar 08, 1956 (65 y.o. [name] Primary Care Less [place]: [name] Other Clinician: Referring Katesha Eichel: Treating Canda Podgorski/Extender: [name] in Treatment: 9 Edema Assessment Assessed: [name]: No] [Right: No] E[Left: dema] [Right: :] Calf Left: Right: Point of Measurement: 32 cm From Medial Instep 37 cm Ankle Left: Right: Point of Measurement: 10 cm From Medial Instep 21.2 cm Vascular Assessment Pulses: Dorsalis Pedis Palpable: [Right:Yes] Electronic Signature(s) Signed: 04/18/2021 7:10:16 PM By: Baruch Gouty RN, BSN Entered By: Baruch Gouty on 04/18/2021 08:27:51 -------------------------------------------------------------------------------- Multi Wound Chart Details Patient Name: Date of Service: [name] RD L. 04/18/2021 8:15 A M Medical Record Number: [id number] Patient Account Number: [id number] Date of Birth/Sex: Treating RN: [birth year] (65 y.o. [name] Primary Care Gustin Zobrist: [name] Other Clinician: Referring Kenslie Abbruzzese: Treating Samanda Buske/Extender: [name] in Treatment: 9 Vital Signs Height(in): 70 Pulse(bpm):  57 Weight(lbs): 260 Blood Pressure(mmHg): 102/66 Body Mass Index(BMI): 37 Temperature(F): 97.7 Respiratory Rate(breaths/min): 18 Photos: [2:No Photos Right, Anterior Lower Leg] [N/A:N/A N/A] Wound Location: [2:Skin Tear/Laceration] [N/A:N/A] Wounding Event: [2:Skin Tear] [N/A:N/A] Primary Etiology: [2:Cataracts, Anemia, Chronic] [N/A:N/A] Comorbid History: [2:Obstructive Pulmonary Disease (COPD), Sleep Apnea, Arrhythmia, Congestive Heart Failure, Deep Vein Thrombosis, Myocardial Infarction, Peripheral Venous Disease, Type II Diabetes, Osteoarthritis 04/03/2021] [N/A:N/A] Date Acquired: [2:1] [N/A:N/A] Weeks of Treatment: [2:Open] [N/A:N/A] Wound Status: [2:2.3x3x0.1] [N/A:N/A] Measurements L x W x D (cm) [2:5.419] [N/A:N/A] A (cm) :  rea [2:0.542] [N/A:N/A] Volume (cm) : [2:1.40%] [N/A:N/A] % Reduction in Area: [2:1.50%] [N/A:N/A] % Reduction in Volume: [2:Full Thickness Without Exposed] [N/A:N/A] Classification: [2:Support Structures Medium] [N/A:N/A] Exudate A mount: [2:Serosanguineous] [N/A:N/A] Exudate Type: [2:red, brown] [N/A:N/A] Exudate Color: [2:Flat and Intact] [N/A:N/A] Wound Margin: [2:Medium (34-66%)] [N/A:N/A] Granulation A mount: [2:Red] [N/A:N/A] Granulation Quality: [2:Medium (34-66%)] [N/A:N/A] Necrotic A mount: [2:Fat Layer (Subcutaneous Tissue): Yes N/A] Exposed Structures: [2:Fascia: No Tendon: No Muscle: No Joint: No Bone: No Small (1-33%)] [N/A:N/A] Epithelialization: [2:Debridement - Excisional] [N/A:N/A] Debridement: Pre-procedure Verification/Time Out 08:35 [N/A:N/A] Taken: [2:Lidocaine 4% Topical Solution] [N/A:N/A] Pain Control: [2:Subcutaneous, Slough] [N/A:N/A] Tissue Debrided: [2:Skin/Subcutaneous Tissue] [N/A:N/A] Level: [2:6.9] [N/A:N/A] Debridement A (sq cm): [2:rea Curette] [N/A:N/A] Instrument: [2:Minimum] [N/A:N/A] Bleeding: [2:Pressure] [N/A:N/A] Hemostasis A chieved: [2:0] [N/A:N/A] Procedural Pain: [2:0] [N/A:N/A] Post Procedural  Pain: [2:Procedure was tolerated well] [N/A:N/A] Debridement Treatment Response: [2:2.3x3x0.1] [N/A:N/A] Post Debridement Measurements L x W x D (cm) [2:0.542] [N/A:N/A] Post Debridement Volume: (cm) [2:Debridement] [N/A:N/A] Treatment Notes Wound #2 (Lower Leg) Wound Laterality: Right, Anterior Cleanser Normal Saline Discharge Instruction: Cleanse the wound with Normal Saline prior to applying a clean dressing using gauze sponges, not tissue or cotton balls. Soap and Water Discharge Instruction: May shower and wash wound with dial antibacterial soap and water prior to dressing change. Peri-Wound Care Triamcinolone 15 (g) Discharge Instruction: Use triamcinolone 15 (g) as directed Sween Lotion (Moisturizing lotion) Discharge Instruction: Apply moisturizing lotion as directed Topical Primary Dressing Hydrofera Blue Ready Foam, 2.5 x2.5 in Discharge Instruction: Apply to wound bed as instructed Santyl Ointment Discharge Instruction: Apply nickel thick amount to wound bed as instructed Secondary Dressing Woven Gauze Sponge, Non-Sterile 4x4 in Discharge Instruction: Apply over primary dressing as directed. ABD Pad, 5x9 Discharge Instruction: Apply over primary dressing as directed. Secured With Compression Wrap Kerlix Roll 4.5x3.1 (in/yd) Discharge Instruction: Apply Kerlix and Coban compression as directed. Coban Self-Adherent Wrap 4x5 (in/yd) Discharge Instruction: Apply over Kerlix as directed. Compression Stockings Add-Ons Electronic Signature(s) Signed: 04/18/2021 9:12:07 AM By: [name] Signed: 04/18/2021 6:55:28 PM By: Deon Pilling Entered By: Kalman Shan on 04/18/2021 09:08:55 -------------------------------------------------------------------------------- Multi-Disciplinary Care Plan Details Patient Name: Date of Service: [name], Zachary Fenton RD L. 04/18/2021 8:15 A M Medical Record Number: [id number] Patient Account Number: [id number] Date of  Birth/Sex: Treating RN: 03-05-1956 (65 y.o. [name] Primary Care Mykell Rawl: [name] Other Clinician: Referring Saad Buhl: Treating Kiel Cockerell/Extender: [name] in Treatment: [place] reviewed with physician Active Inactive Wound/Skin Impairment Nursing Diagnoses: Impaired tissue integrity Knowledge deficit related to ulceration/compromised skin integrity Goals: Patient/caregiver will verbalize understanding of skin care regimen Date Initiated: 02/13/2021 Target Resolution Date: 05/08/2021 Goal Status: Active Ulcer/skin breakdown will have a volume reduction of 30% by week 4 Date Initiated: 02/13/2021 Target Resolution Date: 05/08/2021 Goal Status: Active Interventions: Assess patient/caregiver ability to obtain necessary supplies Assess patient/caregiver ability to perform ulcer/skin care regimen upon admission and as needed Assess ulceration(s) every visit Provide education on ulcer and skin care Treatment Activities: Skin care regimen initiated : 02/13/2021 Topical wound management initiated : 02/13/2021 Notes: Electronic Signature(s) Signed: 04/18/2021 6:55:28 PM By: Deon Pilling Entered By: Deon Pilling on 04/18/2021 08:41:12 -------------------------------------------------------------------------------- Pain Assessment Details Patient Name: Date of Service: [name] RD L. 04/18/2021 8:15 A M Medical Record Number: [id number] Patient Account Number: [id number] Date of Birth/Sex: Treating RN: 26-Jan-1956 (65 y.o. [name] Primary Care Rosibel Giacobbe: Other Clinician: [name] Referring Wahid Holley: Treating Clerence Gubser/Extender: [name] in Treatment: 9 Active  Problems Location of Pain Severity and Description of Pain Patient Has Paino Yes Site Locations Pain Location: Pain in Ulcers Duration of the Pain. Constant / Intermittento Intermittent Rate the pain. Current Pain Level:  6 Worst Pain Level: 8 Least Pain Level: 0 Character of Pain Describe the Pain: Sharp, Stabbing, Tender Pain Management and Medication Current Pain Management: Medication: Yes Is the Current Pain Management Adequate: Adequate How does your wound impact your activities of daily livingo Sleep: Yes Bathing: No Appetite: No Relationship With Others: No Bladder Continence: No Emotions: No Bowel Continence: No Work: No Toileting: No Drive: No Dressing: No Hobbies: No Electronic Signature(s) Signed: 04/18/2021 7:10:16 PM By: Baruch Gouty RN, BSN Entered By: Baruch Gouty on 04/18/2021 08:32:53 -------------------------------------------------------------------------------- Patient/Caregiver Education Details Patient Name: Date of Service: [name], Zachary RD L. 7/5/2022andnbsp8:15 A M Medical Record Number: [id number] Patient Account Number: [id number] Date of Birth/Gender: Treating RN: 1956/02/26 (65 y.o. [name] Primary Care Physician: [name] Other Clinician: Referring Physician: Treating Physician/Extender: [name] in Treatment: 9 Education Assessment Education Provided To: Patient Education Topics Provided Wound/Skin Impairment: Handouts: Skin Care Do's and Dont's Methods: Explain/Verbal Responses: Reinforcements needed Electronic Signature(s) Signed: 04/18/2021 6:55:28 PM By: Deon Pilling Entered By: Deon Pilling on 04/18/2021 08:41:58 -------------------------------------------------------------------------------- Wound Assessment Details Patient Name: Date of Service: [name] RD L. 04/18/2021 8:15 A M Medical Record Number: [id number] Patient Account Number: [id number] Date of Birth/Sex: Treating RN: 04-23-1956 (65 y.o. [name] Primary Care Chuckie Mccathern: [name] Other Clinician: Referring Davier Tramell: Treating Ferry Matthis/Extender: [name] in Treatment: 9 Wound Status Wound Number: 2  Primary Skin T ear Etiology: Wound Location: Right, Anterior Lower Leg Wound Open Wounding Event: Skin Tear/Laceration Status: Date Acquired: 04/03/2021 Comorbid Cataracts, Anemia, Chronic Obstructive Pulmonary Disease Weeks Of Treatment: 1 History: (COPD), Sleep Apnea, Arrhythmia, Congestive Heart Failure, Deep Clustered Wound: No Vein Thrombosis, Myocardial Infarction, Peripheral Venous Disease, Type II Diabetes, Osteoarthritis Photos Wound Measurements Length: (cm) 2.3 Width: (cm) 3 Depth: (cm) 0.1 Area: (cm) 5.419 Volume: (cm) 0.542 % Reduction in Area: 1.4% % Reduction in Volume: 1.5% Epithelialization: Small (1-33%) Tunneling: No Undermining: No Wound Description Classification: Full Thickness Without Exposed Support Structures Wound Margin: Flat and Intact Exudate Amount: Medium Exudate Type: Serosanguineous Exudate Color: red, brown Foul Odor After Cleansing: No Slough/Fibrino No Wound Bed Granulation Amount: Medium (34-66%) Exposed Structure Granulation Quality: Red Fascia Exposed: No Necrotic Amount: Medium (34-66%) Fat Layer (Subcutaneous Tissue) Exposed: Yes Necrotic Quality: Adherent Slough Tendon Exposed: No Muscle Exposed: No Joint Exposed: No Bone Exposed: No Treatment Notes Wound #2 (Lower Leg) Wound Laterality: Right, Anterior Cleanser Normal Saline Discharge Instruction: Cleanse the wound with Normal Saline prior to applying a clean dressing using gauze sponges, not tissue or cotton balls. Soap and Water Discharge Instruction: May shower and wash wound with dial antibacterial soap and water prior to dressing change. Peri-Wound Care Triamcinolone 15 (g) Discharge Instruction: Use triamcinolone 15 (g) as directed Sween Lotion (Moisturizing lotion) Discharge Instruction: Apply moisturizing lotion as directed Topical Primary Dressing Hydrofera Blue Ready Foam, 2.5 x2.5 in Discharge Instruction: Apply to wound bed as instructed Santyl  Ointment Discharge Instruction: Apply nickel thick amount to wound bed as instructed Secondary Dressing Woven Gauze Sponge, Non-Sterile 4x4 in Discharge Instruction: Apply over primary dressing as directed. ABD Pad, 5x9 Discharge Instruction: Apply over primary dressing as directed. Secured With Compression Wrap Kerlix Roll 4.5x3.1 (in/yd) Discharge Instruction: Apply Kerlix and Coban compression as directed. Coban Self-Adherent Wrap 4x5 (  in/yd) Discharge Instruction: Apply over Kerlix as directed. Compression Stockings Add-Ons Electronic Signature(s) Signed: 04/18/2021 5:48:24 PM By: Sandre Kitty Signed: 04/18/2021 7:10:16 PM By: Baruch Gouty RN, BSN Entered By: Sandre Kitty on 04/18/2021 17:16:36 -------------------------------------------------------------------------------- Vitals Details Patient Name: Date of Service: [name], Zachary Fenton RD L. 04/18/2021 8:15 A M Medical Record Number: [id number] Patient Account Number: [id number] Date of Birth/Sex: Treating RN: 02-May-1956 (65 y.o. [name] Primary Care Kierrah Kilbride: [name] Other Clinician: Referring Zaylynn Rickett: Treating Karin Pinedo/Extender: [name] in Treatment: 9 Vital Signs Time Taken: 08:23 Temperature (F): 97.7 Height (in): 70 Pulse (bpm): 77 Source: Stated Respiratory Rate (breaths/min): 18 Weight (lbs): 260 Blood Pressure (mmHg): 102/66 Body Mass Index (BMI): 37.3 Reference Range: 80 - 120 mg / dl Electronic Signature(s) Signed: 04/18/2021 7:10:16 PM By: Baruch Gouty RN, BSN Entered By: Baruch Gouty on 04/18/2021 08:24:03

## 2021-04-19 ENCOUNTER — Telehealth: Payer: Self-pay

## 2021-04-19 NOTE — Chronic Care Management (AMB) (Signed)
  Care Management   Note  04/19/2021 Name: [name] MRN: [id number] DOB: October 18, 1955  [name] is a 65 y.o. year old male who is a primary care patient of [name], MD and is actively engaged with the care management team. I reached out to [name] by phone today to assist with re-scheduling an initial visit with the RN Case Manager  Follow up plan: Telephone appointment with care management team member scheduled for:04/27/2021  Noreene Larsson, [place], [place], [place] [address] Direct Dial: [phone] [email or web address]

## 2021-04-19 NOTE — Chronic Care Management (AMB) (Signed)
  Care Management   Note  04/19/2021 Name: [name] MRN: [id number] DOB: 1956/05/01  [name] is a 65 y.o. year old male who is a primary care patient of [name], MD and is actively engaged with the care management team. I reached out to [name] by phone today to assist with re-scheduling an initial visit with the RN Case Manager  Follow up plan: Unsuccessful telephone outreach attempt made. A HIPAA compliant phone message was left for the patient providing contact information and requesting a return call.  The care management team will reach out to the patient again over the next 7 days.  If patient returns call to provider office, please advise to call [facility]  at [place] [address], [place], [place], [place] [address] Direct Dial: [phone] [email or web address]

## 2021-04-24 ENCOUNTER — Ambulatory Visit (INDEPENDENT_AMBULATORY_CARE_PROVIDER_SITE_OTHER): Payer: Medicare HMO

## 2021-04-24 ENCOUNTER — Encounter (HOSPITAL_BASED_OUTPATIENT_CLINIC_OR_DEPARTMENT_OTHER): Payer: Medicare HMO | Admitting: Internal Medicine

## 2021-04-24 ENCOUNTER — Encounter: Payer: Self-pay | Admitting: Orthopaedic Surgery

## 2021-04-24 ENCOUNTER — Ambulatory Visit: Payer: Medicare HMO | Admitting: Orthopaedic Surgery

## 2021-04-24 ENCOUNTER — Other Ambulatory Visit: Payer: Self-pay

## 2021-04-24 VITALS — Ht 70.0 in | Wt 254.2 lb

## 2021-04-24 DIAGNOSIS — I4819 Other persistent atrial fibrillation: Secondary | ICD-10-CM | POA: Diagnosis not present

## 2021-04-24 DIAGNOSIS — M25562 Pain in left knee: Secondary | ICD-10-CM

## 2021-04-24 DIAGNOSIS — L97829 Non-pressure chronic ulcer of other part of left lower leg with unspecified severity: Secondary | ICD-10-CM | POA: Diagnosis not present

## 2021-04-24 DIAGNOSIS — I5022 Chronic systolic (congestive) heart failure: Secondary | ICD-10-CM | POA: Diagnosis not present

## 2021-04-24 DIAGNOSIS — G8929 Other chronic pain: Secondary | ICD-10-CM

## 2021-04-24 DIAGNOSIS — I110 Hypertensive heart disease with heart failure: Secondary | ICD-10-CM | POA: Diagnosis not present

## 2021-04-24 DIAGNOSIS — I872 Venous insufficiency (chronic) (peripheral): Secondary | ICD-10-CM | POA: Diagnosis not present

## 2021-04-24 DIAGNOSIS — S81811A Laceration without foreign body, right lower leg, initial encounter: Secondary | ICD-10-CM | POA: Diagnosis not present

## 2021-04-24 DIAGNOSIS — E11622 Type 2 diabetes mellitus with other skin ulcer: Secondary | ICD-10-CM | POA: Diagnosis not present

## 2021-04-24 NOTE — Progress Notes (Signed)
IGNAZIO, KINCAID ([id number]) Visit Report for 04/24/2021 Arrival Information Details Patient Name: Date of Service: [name] RD L. 04/24/2021 2:30 PM Medical Record Number: [id number] Patient Account Number: [id number] Date of Birth/Sex: Treating RN: 06/22/1956 (66 y.o. [name] Primary Care [name]: [name] Other Clinician: Referring [name]: Treating [name]/Extender: [name] in Treatment: 10 Visit Information History Since Last Visit Added or deleted any medications: No Patient Arrived: [name] Any new allergies or adverse reactions: No Arrival Time: 14:20 Had a fall or experienced change in No Accompanied By: self activities of daily living that may affect Transfer Assistance: None risk of falls: Patient Identification Verified: Yes Signs or symptoms of abuse/neglect since last visito No Secondary Verification Process Completed: Yes Hospitalized since last visit: No Patient Requires Transmission-Based Precautions: No Implantable device outside of the clinic excluding No Patient Has Alerts: Yes cellular tissue based products placed in the center Patient Alerts: R ABI= 1.33, L ABI=1.16 since last visit: Has Dressing in Place as Prescribed: Yes Has Compression in Place as Prescribed: Yes Pain Present Now: No Electronic Signature(s) Signed: 04/24/2021 5:17:45 PM By: Deon Pilling Entered By: Deon Pilling on 04/24/2021 14:31:03 -------------------------------------------------------------------------------- Encounter Discharge Information Details Patient Name: Date of Service: [name], [name] RD L. 04/24/2021 2:30 PM Medical Record Number: [id number] Patient Account Number: [id number] Date of Birth/Sex: Treating RN: 04-21-1956 (65 y.o. [name] Primary Care Meriam Chojnowski: [name] Other Clinician: Referring Tammee Thielke: Treating [name] in Treatment: 10 Encounter Discharge Information  Items Post Procedure Vitals Discharge Condition: Stable Temperature (F): 97.9 Ambulatory Status: Ambulatory Pulse (bpm): 84 Discharge Destination: Home Respiratory Rate (breaths/min): 20 Transportation: Private Auto Blood Pressure (mmHg): 103/67 Accompanied By: self Schedule Follow-up Appointment: Yes Clinical Summary of Care: Electronic Signature(s) Signed: 04/24/2021 5:17:45 PM By: Deon Pilling Entered By: Deon Pilling on 04/24/2021 16:28:50 -------------------------------------------------------------------------------- Lower Extremity Assessment Details Patient Name: Date of Service: [name] RD L. 04/24/2021 2:30 PM Medical Record Number: [id number] Patient Account Number: [id number] Date of Birth/Sex: Treating RN: 1956/01/02 (65 y.o. [name] Primary Care Aurel Nguyen: [name] Other Clinician: Referring Shalom Ware: Treating [name]/Extender: [name] in Treatment: 10 Edema Assessment Assessed: [name]: No] [name]: Yes] Edema: [Left: N] [Right: o] Calf Left: Right: Point of Measurement: 32 cm From Medial Instep 35 cm Ankle Left: Right: Point of Measurement: 10 cm From Medial Instep 22 cm Vascular Assessment Pulses: Dorsalis Pedis Palpable: [Right:Yes] Electronic Signature(s) Signed: 04/24/2021 5:17:45 PM By: Deon Pilling Entered By: Deon Pilling on 04/24/2021 14:31:44 -------------------------------------------------------------------------------- Multi Wound Chart Details Patient Name: Date of Service: [name], [name] RD L. 04/24/2021 2:30 PM Medical Record Number: [id number] Patient Account Number: [id number] Date of Birth/Sex: Treating RN: 05/05/1956 (64 y.o. [name] Primary Care [name]: [name] Other Clinician: Referring Lafonda Patron: Treating Mykelti Goldenstein/Extender: [name] in Treatment: 10 Vital Signs Height(in): 40 Pulse(bpm): 32 Weight(lbs): 72 Blood Pressure(mmHg): 103/67 Body  Mass Index(BMI): 37 Temperature(F): 97.9 Respiratory Rate(breaths/min): 20 Photos: [2:No Photos Right, Anterior Lower Leg] [N/A:N/A N/A] Wound Location: [2:Skin Tear/Laceration] [N/A:N/A] Wounding Event: [2:Skin Tear] [N/A:N/A] Primary Etiology: [2:Cataracts, Anemia, Chronic] [N/A:N/A] Comorbid History: [2:Obstructive Pulmonary Disease (COPD), Sleep Apnea, Arrhythmia, Congestive Heart Failure, Deep Vein Thrombosis, Myocardial Infarction, Peripheral Venous Disease, Type II Diabetes, Osteoarthritis 04/03/2021] [N/A:N/A] Date Acquired: [2:2] [N/A:N/A] Weeks of Treatment: [2:Open] [N/A:N/A] Wound Status: [2:1x2.4x0.1] [N/A:N/A] Measurements L x W x D (cm) [2:1.885] [N/A:N/A] A (cm) : rea [2:0.188] [N/A:N/A] Volume (cm) : [2:65.70%] [N/A:N/A] % Reduction  in Area: [2:65.80%] [N/A:N/A] % Reduction in Volume: [2:Full Thickness Without Exposed] [N/A:N/A] Classification: [2:Support Structures Medium] [N/A:N/A] Exudate A mount: [2:Serosanguineous] [N/A:N/A] Exudate Type: [2:red, brown] [N/A:N/A] Exudate Color: [2:Flat and Intact] [N/A:N/A] Wound Margin: [2:Large (67-100%)] [N/A:N/A] Granulation A mount: [2:Red] [N/A:N/A] Granulation Quality: [2:Small (1-33%)] [N/A:N/A] Necrotic A mount: [2:Fat Layer (Subcutaneous Tissue): Yes N/A] Exposed Structures: [2:Fascia: No Tendon: No Muscle: No Joint: No Bone: No Medium (34-66%)] [N/A:N/A] Epithelialization: [2:Debridement - Excisional] [N/A:N/A] Debridement: Pre-procedure Verification/Time Out 14:57 [N/A:N/A] Taken: [2:Lidocaine 4% Topical Solution] [N/A:N/A] Pain Control: [2:Subcutaneous] [N/A:N/A] Tissue Debrided: [2:Skin/Subcutaneous Tissue] [N/A:N/A] Level: [2:2.4] [N/A:N/A] Debridement A (sq cm): [2:rea Curette] [N/A:N/A] Instrument: [2:Minimum] [N/A:N/A] Bleeding: [2:Pressure] [N/A:N/A] Hemostasis A chieved: [2:Procedure was tolerated well] [N/A:N/A] Debridement Treatment Response: [2:1x2.4x0.1] [N/A:N/A] Post Debridement Measurements L  x W x D (cm) [2:0.188] [N/A:N/A] Post Debridement Volume: (cm) [2:Debridement] [N/A:N/A] Treatment Notes Electronic Signature(s) Signed: 04/24/2021 4:06:27 PM By: [name] Signed: 04/24/2021 5:19:06 PM By: [name] Entered By: Kalman Shan on 04/24/2021 16:00:01 -------------------------------------------------------------------------------- Multi-Disciplinary Care Plan Details Patient Name: Date of Service: [name], [name] RD L. 04/24/2021 2:30 PM Medical Record Number: [id number] Patient Account Number: [id number] Date of Birth/Sex: Treating RN: 08/30/1956 (65 y.o. [name] Primary Care Sumit Branham: [name] Other Clinician: Referring Reona Zendejas: Treating Kyson Kupper/Extender: [name] in Treatment: [place] reviewed with physician Active Inactive Wound/Skin Impairment Nursing Diagnoses: Impaired tissue integrity Knowledge deficit related to ulceration/compromised skin integrity Goals: Patient/caregiver will verbalize understanding of skin care regimen Date Initiated: 02/13/2021 Target Resolution Date: 05/08/2021 Goal Status: Active Ulcer/skin breakdown will have a volume reduction of 30% by week 4 Date Initiated: 02/13/2021 Target Resolution Date: 05/08/2021 Goal Status: Active Interventions: Assess patient/caregiver ability to obtain necessary supplies Assess patient/caregiver ability to perform ulcer/skin care regimen upon admission and as needed Assess ulceration(s) every visit Provide education on ulcer and skin care Treatment Activities: Skin care regimen initiated : 02/13/2021 Topical wound management initiated : 02/13/2021 Notes: Electronic Signature(s) Signed: 04/24/2021 5:19:06 PM By: [name] Entered By: [name] on 04/24/2021 15:05:32 -------------------------------------------------------------------------------- Pain Assessment Details Patient Name: Date of Service: [name] RD  L. 04/24/2021 2:30 PM Medical Record Number: [id number] Patient Account Number: [id number] Date of Birth/Sex: Treating RN: 1956/05/15 (65 y.o. [name] Primary Care Alette Kataoka: [name] Other Clinician: Referring Brezlyn Manrique: Treating Lucie Friedlander/Extender: [name] in Treatment: 10 Active Problems Location of Pain Severity and Description of Pain Patient Has Paino No Site Locations Rate the pain. Current Pain Level: 0 Pain Management and Medication Current Pain Management: Medication: No Cold Application: No Rest: No Massage: No Activity: No T.E.N.S.: No Heat Application: No Leg drop or elevation: No Is the Current Pain Management Adequate: Adequate How does your wound impact your activities of daily livingo Sleep: No Bathing: No Appetite: No Relationship With Others: No Bladder Continence: No Emotions: No Bowel Continence: No Work: No Toileting: No Drive: No Dressing: No Hobbies: No Notes Per patient occasional pain. Electronic Signature(s) Signed: 04/24/2021 5:17:45 PM By: Deon Pilling Entered By: Deon Pilling on 04/24/2021 14:31:33 -------------------------------------------------------------------------------- Patient/Caregiver Education Details Patient Name: Date of Service: [name] RD L. 7/11/2022andnbsp2:30 PM Medical Record Number: [id number] Patient Account Number: [id number] Date of Birth/Gender: Treating RN: [birth year] (65 y.o. [name] Primary Care Physician: [name] Other Clinician: Referring Physician: Treating Physician/Extender: [name] in Treatment: 10 Education Assessment Education Provided To: Patient Education Topics Provided Venous: Methods: Explain/Verbal, Printed Responses: State content correctly Wound/Skin Impairment:  Methods: Explain/Verbal, Printed Responses: State content correctly Electronic Signature(s) Signed: 04/24/2021 5:19:06 PM By: [name] Entered By: [name] on 04/24/2021 15:05:55 -------------------------------------------------------------------------------- Wound Assessment Details Patient Name: Date of Service: [name] RD L. 04/24/2021 2:30 PM Medical Record Number: [id number] Patient Account Number: [id number] Date of Birth/Sex: Treating RN: February 05, 1956 (65 y.o. [name], Meta.Reding Primary Care Brandace Cargle: [name] Other Clinician: Referring Katerina Zurn: Treating Ronda Rajkumar/Extender: [name] in Treatment: 10 Wound Status Wound Number: 2 Primary Skin T ear Etiology: Wound Location: Right, Anterior Lower Leg Wound Open Wounding Event: Skin Tear/Laceration Status: Date Acquired: 04/03/2021 Comorbid Cataracts, Anemia, Chronic Obstructive Pulmonary Disease Weeks Of Treatment: 2 History: (COPD), Sleep Apnea, Arrhythmia, Congestive Heart Failure, Deep Clustered Wound: No Vein Thrombosis, Myocardial Infarction, Peripheral Venous Disease, Type II Diabetes, Osteoarthritis Wound Measurements Length: (cm) 1 Width: (cm) 2.4 Depth: (cm) 0.1 Area: (cm) 1.885 Volume: (cm) 0.188 % Reduction in Area: 65.7% % Reduction in Volume: 65.8% Epithelialization: Medium (34-66%) Tunneling: No Undermining: No Wound Description Classification: Full Thickness Without Exposed Support Structures Wound Margin: Flat and Intact Exudate Amount: Medium Exudate Type: Serosanguineous Exudate Color: red, brown Foul Odor After Cleansing: No Slough/Fibrino Yes Wound Bed Granulation Amount: Large (67-100%) Exposed Structure Granulation Quality: Red Fascia Exposed: No Necrotic Amount: Small (1-33%) Fat Layer (Subcutaneous Tissue) Exposed: Yes Necrotic Quality: Adherent Slough Tendon Exposed: No Muscle Exposed: No Joint Exposed: No Bone Exposed: No Treatment Notes Wound #2 (Lower Leg) Wound Laterality: Right, Anterior Cleanser Normal Saline Discharge Instruction: Cleanse the wound with Normal  Saline prior to applying a clean dressing using gauze sponges, not tissue or cotton balls. Soap and Water Discharge Instruction: May shower and wash wound with dial antibacterial soap and water prior to dressing change. Peri-Wound Care Triamcinolone 15 (g) Discharge Instruction: Use triamcinolone 15 (g) as directed Sween Lotion (Moisturizing lotion) Discharge Instruction: Apply moisturizing lotion as directed Topical Primary Dressing Hydrofera Blue Ready Foam, 2.5 x2.5 in Discharge Instruction: Apply to wound bed as instructed Santyl Ointment Discharge Instruction: Apply nickel thick amount to wound bed as instructed Secondary Dressing Woven Gauze Sponge, Non-Sterile 4x4 in Discharge Instruction: Apply over primary dressing as directed. ABD Pad, 5x9 Discharge Instruction: Apply over primary dressing as directed. Secured With Compression Wrap Kerlix Roll 4.5x3.1 (in/yd) Discharge Instruction: Apply Kerlix and Coban compression as directed. Coban Self-Adherent Wrap 4x5 (in/yd) Discharge Instruction: Apply over Kerlix as directed. Compression Stockings Add-Ons Electronic Signature(s) Signed: 04/24/2021 5:17:45 PM By: Deon Pilling Entered By: Deon Pilling on 04/24/2021 14:32:01 -------------------------------------------------------------------------------- Vitals Details Patient Name: Date of Service: [name], [name] RD L. 04/24/2021 2:30 PM Medical Record Number: [id number] Patient Account Number: [id number] Date of Birth/Sex: Treating RN: 1955-10-25 (65 y.o. [name], Meta.Reding Primary Care Florene Brill: [name] Other Clinician: Referring Corean Yoshimura: Treating Kino Dunsworth/Extender: [name] in Treatment: 10 Vital Signs Time Taken: 14:20 Temperature (F): 97.9 Height (in): 70 Pulse (bpm): 84 Weight (lbs): 260 Respiratory Rate (breaths/min): 20 Body Mass Index (BMI): 37.3 Blood Pressure (mmHg): 103/67 Reference Range: 80 - 120 mg / dl Electronic  Signature(s) Signed: 04/24/2021 5:17:45 PM By: Deon Pilling Entered By: Deon Pilling on 04/24/2021 14:31:16

## 2021-04-24 NOTE — Progress Notes (Signed)
Office Visit Note   Patient: [name]           Date of Birth: Nov 06, 1955           MRN: [id number] Visit Date: 04/24/2021              Requested by: Lyndal Pulley, DO [place],  [place] [id number] PCP: [name], MD   Assessment & Plan: Visit Diagnoses:  1. Chronic pain of left knee     Plan: I agree with his need for knee replacement surgery on his left knee given the severity of his arthritis on clinical exam and x-rays.  He has tried and failed all forms of conservative treatment as well.  He will stop Coumadin 5 days prior to surgery.  I am comfortable with setting the surgery for him given the severity of his arthritis and the detrimental effect this is having on his mobility and his quality of life.  We had a long and thorough discussion about heightened risks of acute blood loss anemia, nerve vessel injury, fracture, infection and DVT.  He understands her goals reduce pain improve mobility overall proved quality of life.  All questions and concerns were answered and addressed.  We will work on getting this scheduled in the future.  Follow-Up Instructions: Return for 2 weeks post-op.   Orders:  Orders Placed This Encounter  Procedures   XR Knee 1-2 Views Left   No orders of the defined types were placed in this encounter.     Procedures: No procedures performed   Clinical Data: No additional findings.   Subjective: Chief Complaint  Patient presents with   Left Knee - Pain  The patient comes in today actually for evaluation treatment of severe end-stage arthritis of his left knee and the need for knee replacement surgery.  He actually has a right knee that was replaced by one of my colleagues in town in 2015.  His left knee has well-documented end-stage arthritis.  He is a patient of Dr. Hulan Saas in terms of a sports medicine aspect of things.  He had multiple injections in that left knee.  At this point he is tried and failed  conservative treatment for well over 12 months.  He ambulates with a walking sticks.  He is worked on weight loss.  His BMI is down to 36.47.  He is on Coumadin and is seen in the heart failure clinic.  He has had chronic venous stasis changes and has been dealing with a wound on the right leg which is healing and he has wraps on those.  He is already shown the ability to heal a wound on his left leg.  His left knee pain is 10 out of 10 and it is daily.  It is definitely affecting his mobility, his quality of life and his actives daily living.  HPI  Review of Systems   Objective: Vital Signs: Ht 5\' 10"  (1.778 m)   Wt 254 lb 3.2 oz (115.3 kg)   BMI 36.47 kg/m   Physical Exam He is alert and orient x3 and in no acute distress.  He ambulates very slowly using bilateral walking sticks. Ortho Exam Examination of his left knee shows significant patellofemoral crepitation and significant varus malalignment.  There is severe tenderness along the medial joint line and instability on this testing the ligaments of the knee. Specialty Comments:  No specialty comments available.  Imaging: XR Knee 1-2 Views Left  Result Date: 04/24/2021 2 views of the left knee show severe end-stage arthritis of left knee.  There is significant varus malalignment and almost subluxation of the joint.  There is bone-on-bone wear of the medial compartment and the patellofemoral joint and large osteophytes in all 3 compartments.    PMFS History: Patient Active Problem List   Diagnosis Date Noted   Preop exam for internal medicine 03/04/2021   B12 deficiency 03/04/2021   Vitamin D deficiency 03/01/2021   Myalgia 12/01/2020   Laceration of left leg 09/17/2020   Statin myopathy 08/02/2020   Rotator cuff arthropathy of both shoulders 02/23/2020   (HFpEF) heart failure with preserved ejection fraction ([place]) 02/22/2020   ICD (implantable cardioverter-defibrillator) in place 12/27/2019   COVID-19 virus infection  12/09/2019   COVID-19 12/09/2019   Hypotension 12/09/2019   Upper respiratory infection 11/27/2019   COPD exacerbation ([place]) 11/27/2019   Elevated PSA 09/23/2019   Lip lesion 12/23/2018   Diabetic ulcer of left lower leg with necrosis of muscle ([place]) 12/05/2018   Chronic bursitis of left shoulder 07/01/2018   Pain due to total knee replacement ([place]) 03/31/2018   Rotator cuff arthropathy, left 02/13/2018   Right rotator cuff tear arthropathy 01/14/2018   Increased prostate specific antigen (PSA) velocity 09/26/2017   Trigger point of thoracic region 07/04/2017   Right lumbar radiculopathy 05/15/2017   Right leg swelling 05/09/2017   Glaucoma 04/18/2017   History of nasal polyposis 02/19/2017   Chronic rhinitis 02/19/2017   Microhematuria 12/04/2016   Encounter for well adult exam with abnormal findings 11/29/2015   Type II diabetes mellitus ([place])    Hyperlipidemia    Persistent atrial fibrillation ([place]) 03/30/2015   Syncope 03/30/2015   Anemia 02/23/2015   Thyrotoxicosis 02/23/2015   Hx of adenomatous colonic polyps 10/21/2014   De Quervain's tenosynovitis, right 04/30/2014   Osteoarthritis of right knee 02/19/2014   Encounter for therapeutic drug monitoring 11/17/2013   Thoracic degenerative disc disease 06/17/2013   Thoracic spondylosis without myelopathy 02/11/2013   Lumbosacral spondylosis without myelopathy 02/11/2013   Degenerative joint disease of knee, left 02/11/2013   COPD (chronic obstructive pulmonary disease) ([place]) 08/20/2012   Bundle branch block, right and extreme left anterior fascicular 05/05/2012   Nonischemic cardiomyopathy ([place]) 05/02/2012   Anticoagulated on warfarin 03/21/2012   Spongiotic dermatitis 09/26/2011   Past myocardial infarction 04/19/2011   UNSPECIFIED URETHRAL STRICTURE 12/26/2010   Chronic pain syndrome 05/18/2010   Obstructive sleep apnea 10/21/2007   NEPHROLITHIASIS, HX OF 10/21/2007   Anxiety 06/09/2007   GERD 06/09/2007   BENIGN  PROSTATIC HYPERTROPHY 06/09/2007   Morbid obesity ([place]) 06/03/2007   Depression 06/03/2007   Essential hypertension 06/03/2007   INSOMNIA 06/03/2007   Past Medical History:  Diagnosis Date   AICD (automatic cardioverter/defibrillator) present    Anemia    supposed to be taking Vit B but doesn't   ANXIETY    takes Xanax nightly   Arthritis    Asthma    Albuterol prn and Advair daily;also takes Prednisone daily   Atrial fibrillation ([place]) 09/06/2015   Cardiomyopathy ([place])    a. EF 25% TEE July 2013; b. EF normalized 2015;  c. 03/2015 Echo: EF 40-45%, difrf HK, PASP 38 mmHg, Mild MR, sev LAE/RAE.   CHF (congestive heart failure) ([facility])    Chronic constipation    takes OTC stool softener   COPD (chronic obstructive pulmonary disease) ([place])    "one dr says COPD; one dr says emphysema" (09/18/2017)   DEPRESSION  takes Zoloft and Doxepin daily   Diverticulitis    DYSKINESIA, ESOPHAGUS    Emphysema of lung ([place])    "one dr says COPD; one dr says emphysema" (09/18/2017)   Essential hypertension        FIBROMYALGIA    GERD (gastroesophageal reflux disease)        Glaucoma    HYPERLIPIDEMIA    a. Intolerant to statins.   INSOMNIA    takes Ambien nightly   Myocardial infarction [facility])    a. 2012 Myoview notable for prior infarct;  b. 03/2015 Lexiscan CL: EF 37%, diff HK, small area of inferior infarct from apex to base-->Med Rx.   Myocardial infarction ([place])    O2 dependent    "2.5L q hs & prn" (09/18/2017)   Paroxysmal atrial fibrillation ([place])    a. CHA2DS2VASc = 3--> takes Coumadin;  b. 03/15/2015 Successful TEE/DCCV;  c. 03/2015 recurrent afib, [facility] d/c'd in setting of hyperthyroidism.   Peripheral neuropathy    Pneumonia 12/2016   Rash and other nonspecific skin eruption 04/12/2009   no cause found saw dermatologists x 2 and allergist   SLEEP APNEA, OBSTRUCTIVE    a. doesn't use CPAP   Syncope    a. 03/2015 s/p MDT LINQ.   Type II diabetes mellitus ([place])         Family  History  Problem Relation Age of Onset   COPD Mother    Asthma Mother    Colon polyps Mother    Allergies Mother    Hypothyroidism Mother    Asthma Maternal Grandmother    Colon cancer Neg Hx     Past Surgical History:  Procedure Laterality Date   ACNE CYST REMOVAL     2 on back    AV NODE ABLATION N/A 10/25/2017   Procedure: AV NODE ABLATION;  Surgeon: Deboraha Sprang, MD;  Location: [facility];  Service: Cardiovascular;  Laterality: N/A;   BIV ICD INSERTION CRT-D N/A 09/18/2017   Procedure: BIV ICD INSERTION CRT-D;  Surgeon: Deboraha Sprang, MD;  Location: [facility];  Service: Cardiovascular;  Laterality: N/A;   CARDIAC CATHETERIZATION N/A 03/21/2016   Procedure: Right/Left Heart Cath and Coronary Angiography;  Surgeon: Larey Dresser, MD;  Location: [facility];  Service: Cardiovascular;  Laterality: N/A;   CARDIOVERSION  04/18/2012   Procedure: CARDIOVERSION;  Surgeon: Fay Records, MD;  Location: [facility];  Service: Cardiovascular;  Laterality: N/A;   CARDIOVERSION  04/25/2012   Procedure: CARDIOVERSION;  Surgeon: Thayer Headings, MD;  Location: [facility];  Service: Cardiovascular;  Laterality: N/A;   CARDIOVERSION  04/25/2012   Procedure: CARDIOVERSION;  Surgeon: Fay Records, MD;  Location: [facility];  Service: Cardiovascular;  Laterality: N/A;   CARDIOVERSION  05/09/2012   Procedure: CARDIOVERSION;  Surgeon: Sherren Mocha, MD;  Location: [facility];  Service: Cardiovascular;  Laterality: N/A;  changed from crenshaw to cooper by trish/leone-endo   CARDIOVERSION N/A 03/15/2015   Procedure: CARDIOVERSION;  Surgeon: Thayer Headings, MD;  Location: [facility];  Service: Cardiovascular;  Laterality: N/A;   COLONOSCOPY     COLONOSCOPY WITH PROPOFOL N/A 10/21/2014   Procedure: COLONOSCOPY WITH PROPOFOL;  Surgeon: Ladene Artist, MD;  Location: [facility];  Service: Endoscopy;  Laterality: N/A;   EP IMPLANTABLE DEVICE N/A 04/06/2015   Procedure: Loop Recorder Insertion;   Surgeon: Evans Lance, MD;  Location: [facility];  Service: Cardiovascular;  Laterality: N/A;   ESOPHAGOGASTRODUODENOSCOPY  JOINT REPLACEMENT     LOOP RECORDER REMOVAL N/A 09/18/2017   Procedure: LOOP RECORDER REMOVAL;  Surgeon: Deboraha Sprang, MD;  Location: [facility];  Service: Cardiovascular;  Laterality: N/A;   RIGHT/LEFT HEART CATH AND CORONARY ANGIOGRAPHY N/A 01/28/2017   Procedure: Right/Left Heart Cath and Coronary Angiography;  Surgeon: Larey Dresser, MD;  Location: [facility];  Service: Cardiovascular;  Laterality: N/A;   TEE WITHOUT CARDIOVERSION  04/25/2012   Procedure: TRANSESOPHAGEAL ECHOCARDIOGRAM (TEE);  Surgeon: Thayer Headings, MD;  Location: [facility];  Service: Cardiovascular;  Laterality: N/A;   TEE WITHOUT CARDIOVERSION N/A 03/15/2015   Procedure: TRANSESOPHAGEAL ECHOCARDIOGRAM (TEE);  Surgeon: Thayer Headings, MD;  Location: [facility];  Service: Cardiovascular;  Laterality: N/A;   TONSILLECTOMY AND ADENOIDECTOMY     TOTAL KNEE ARTHROPLASTY Right 06/15/2014   Procedure: TOTAL KNEE ARTHROPLASTY;  Surgeon: Renette Butters, MD;  Location: [facility];  Service: Orthopedics;  Laterality: Right;   Social History   Occupational History   Occupation: retired/disabled. prev worked in Therapist, sports.    Employer: DISABLED  Tobacco Use   Smoking status: Former    Packs/day: 2.00    Years: 30.00    Pack years: 60.00    Types: Cigarettes    Quit date: 10/16/2007    Years since quitting: 13.5   Smokeless tobacco: Never  Vaping Use   Vaping Use: Never used  Substance and Sexual Activity   Alcohol use: No   Drug use: No   Sexual activity: Not Currently

## 2021-04-24 NOTE — Progress Notes (Signed)
CLEBERT, WENGER ([id number]) Visit Report for 04/24/2021 Chief Complaint Document Details Patient Name: Date of Service: [name] RD L. 04/24/2021 2:30 PM Medical Record Number: [id number] Patient Account Number: [id number] Date of Birth/Sex: Treating RN: [birth year] (65 y.o. [name] Primary Care Provider: [name] Other Clinician: Referring Provider: Treating Provider/Extender: [name] in Treatment: 10 Information Obtained from: Patient Chief Complaint right lower extremity wound Electronic Signature(s) Signed: 04/24/2021 4:06:27 PM By: [name] Entered By: Kalman Shan on 04/24/2021 16:00:10 -------------------------------------------------------------------------------- Debridement Details Patient Name: Date of Service: [name] RD L. 04/24/2021 2:30 PM Medical Record Number: [id number] Patient Account Number: [id number] Date of Birth/Sex: Treating RN: 1956/01/09 (65 y.o. [name] Primary Care Provider: [name] Other Clinician: Referring Provider: Treating Provider/Extender: [name] in Treatment: 10 Debridement Performed for Assessment: Wound #2 Right,Anterior Lower Leg Performed By: Physician Kalman Shan, DO Debridement Type: Debridement Level of Consciousness (Pre-procedure): Awake and Alert Pre-procedure Verification/Time Out Yes - 14:57 Taken: Start Time: 14:58 Pain Control: Lidocaine 4% T opical Solution T Area Debrided (L x W): otal 1 (cm) x 2.4 (cm) = 2.4 (cm) Tissue and other material debrided: Subcutaneous Level: Skin/Subcutaneous Tissue Debridement Description: Excisional Instrument: Curette Bleeding: Minimum Hemostasis Achieved: Pressure End Time: 15:02 Response to Treatment: Procedure was tolerated well Level of Consciousness (Post- Awake and Alert procedure): Post Debridement Measurements of Total Wound Length: (cm) 1 Width: (cm) 2.4 Depth: (cm)  0.1 Volume: (cm) 0.188 Character of Wound/Ulcer Post Debridement: Stable Post Procedure Diagnosis Same as Pre-procedure Electronic Signature(s) Signed: 04/24/2021 4:06:27 PM By: [name] Signed: 04/24/2021 5:19:06 PM By: [name] Entered By: [name] on 04/24/2021 15:04:16 -------------------------------------------------------------------------------- HPI Details Patient Name: Date of Service: [name] RD L. 04/24/2021 2:30 PM Medical Record Number: [id number] Patient Account Number: [id number] Date of Birth/Sex: Treating RN: [birth year] (65 y.o. [name] Primary Care Provider: [name] Other Clinician: Referring Provider: Treating Provider/Extender: [name] in Treatment: 10 History of Present Illness HPI Description: Admission 5/2 Mr. Gane is a 65 year old male with a past medical history of systolic congestive heart failure, COPD, nonischemic cardiomyopathy, type 2 diabetes, chronic venous insufficiency that presents to the clinic for a 66-month history of nonhealing wound to his left lower extremity. He states that in November 2021 he fell off his porch and Hit his leg against a brick. The wound has healed mostly except for 1 area that is still open. He reports moderate Serosanguineous drainage daily. He has been keeping it covered with Band-Aids. He reports 2 rounds of antibiotics for this issue. He denies pain. He denies current acute signs of infection. 5/9; patient presents for 1 week follow-up. He has been using Hydrofera Blue under 3 layer compression. He has no issues or complaints today. He denies signs of infection. 5/16; patient presents for 1 week follow-up. He has been using Hydrofera Blue under 3 layer compression. He has no issues or complaints today. He denies signs of infection. He states he overall feels well 5/23; patient presents for 1 week follow-up. He has been using Hydrofera Blue under 3 layer  compression. He has no issues or complaints today. He denies signs of infection. 6/6; patient presents for 2-week follow-up. He has been using Hydrofera Blue under 3 layer compression. He has had no issues with this. He denies any signs of infection and has no complaints today. Readmissiondifferent wound 6/27 patient developed another wound to his right lower  extremity. He states that 1 week ago he had a large admission for a different wound Paining that was hung over his couch fell on his leg. He has been using alcohol and keeping the area covered. He denies signs of infection. 7/5; patient presents for 1 week follow-up. He has been tolerating Hydrofera Blue under 3 layer compression. He does report some pain to the wound site. He denies signs of infection. 7/11; patient presents for 1 week follow-up. He has been doing well with Hydrofera Blue under 3 layer compression. He reports improvement in pain since last clinic visit. He has no complaints today. He denies signs of infection. Electronic Signature(s) Signed: 04/24/2021 4:06:27 PM By: [name] Entered By: Kalman Shan on 04/24/2021 16:01:13 -------------------------------------------------------------------------------- Physical Exam Details Patient Name: Date of Service: [name] RD L. 04/24/2021 2:30 PM Medical Record Number: [id number] Patient Account Number: [id number] Date of Birth/Sex: Treating RN: 1955-12-27 (65 y.o. [name] Primary Care Provider: [name] Other Clinician: Referring Provider: Treating Provider/Extender: [name] in Treatment: 10 Constitutional respirations regular, non-labored and within target range for patient.. Cardiovascular 2+ dorsalis pedis/posterior tibialis pulses. Psychiatric pleasant and cooperative. Notes Right lower extremity: Open wound to the anterior shin with nonviable tissue and granulation tissue present. No increased warmth erythema  or tenderness to palpation. No purulent drainage. Electronic Signature(s) Signed: 04/24/2021 4:06:27 PM By: [name] Entered By: Kalman Shan on 04/24/2021 16:01:47 -------------------------------------------------------------------------------- Physician Orders Details Patient Name: Date of Service: [name], [name] 04/24/2021 2:30 PM Medical Record Number: [id number] Patient Account Number: [id number] Date of Birth/Sex: Treating RN: 11-08-1955 (65 y.o. [name] Primary Care Provider: [name] Other Clinician: Referring Provider: Treating Provider/Extender: [name] in Treatment: 10 Verbal / Phone Orders: No Diagnosis Coding ICD-10 Coding Code Description [phone] Laceration without foreign body, right lower leg, subsequent encounter I87.2 Venous insufficiency (chronic) (peripheral) I10 Essential (primary) hypertension I42.8 Other cardiomyopathies J44.9 Chronic obstructive pulmonary disease, unspecified I48.19 Other persistent atrial fibrillation E11.9 Type 2 diabetes mellitus without complications X79.39 Chronic systolic (congestive) heart failure Follow-up Appointments ppointment in 2 weeks. - with Dr. Heber [place] Return A Nurse Visit: - Dressing change Bathing/ Shower/ Hygiene May shower with protection but do not get wound dressing(s) wet. Edema Control - Lymphedema / SCD / Other Bilateral Lower Extremities Elevate legs to the level of the heart or above for 30 minutes daily and/or when sitting, a frequency of: - throughout the day Avoid standing for long periods of time. Exercise regularly Moisturize legs daily. - left leg every night before bed. Compression stocking or Garment 20-30 mm/Hg pressure to: - left leg daily Additional Orders / Instructions Follow Nutritious Diet Wound Treatment Wound #2 - Lower Leg Wound Laterality: Right, Anterior Cleanser: Normal Saline 1 x Per Week Discharge Instructions: Cleanse the  wound with Normal Saline prior to applying a clean dressing using gauze sponges, not tissue or cotton balls. Cleanser: Soap and Water 1 x Per Week Discharge Instructions: May shower and wash wound with dial antibacterial soap and water prior to dressing change. Peri-Wound Care: Triamcinolone 15 (g) 1 x Per Week Discharge Instructions: Use triamcinolone 15 (g) as directed Peri-Wound Care: Sween Lotion (Moisturizing lotion) 1 x Per Week Discharge Instructions: Apply moisturizing lotion as directed Prim Dressing: Hydrofera Blue Ready Foam, 2.5 x2.5 in 1 x Per Week ary Discharge Instructions: Apply to wound bed as instructed Prim Dressing: Santyl Ointment 1 x Per Week ary Discharge Instructions: Apply nickel  thick amount to wound bed as instructed Secondary Dressing: Woven Gauze Sponge, Non-Sterile 4x4 in 1 x Per Week Discharge Instructions: Apply over primary dressing as directed. Secondary Dressing: ABD Pad, 5x9 1 x Per Week Discharge Instructions: Apply over primary dressing as directed. Compression Wrap: Kerlix Roll 4.5x3.1 (in/yd) 1 x Per Week Discharge Instructions: Apply Kerlix and Coban compression as directed. Compression Wrap: Coban Self-Adherent Wrap 4x5 (in/yd) 1 x Per Week Discharge Instructions: Apply over Kerlix as directed. Electronic Signature(s) Signed: 04/24/2021 4:06:27 PM By: [name] Entered By: Kalman Shan on 04/24/2021 16:04:47 -------------------------------------------------------------------------------- Problem List Details Patient Name: Date of Service: [name], [name] 04/24/2021 2:30 PM Medical Record Number: [id number] Patient Account Number: [id number] Date of Birth/Sex: Treating RN: 05-22-1956 (65 y.o. [name] Primary Care Provider: [name] Other Clinician: Referring Provider: Treating Provider/Extender: [name] in Treatment: 10 Active Problems ICD-10 Encounter Code Description Active Date  MDM Diagnosis S81.811D Laceration without foreign body, right lower leg, subsequent encounter 04/24/2021 No Yes I87.2 Venous insufficiency (chronic) (peripheral) 02/13/2021 No Yes I10 Essential (primary) hypertension 02/13/2021 No Yes I42.8 Other cardiomyopathies 02/13/2021 No Yes J44.9 Chronic obstructive pulmonary disease, unspecified 02/13/2021 No Yes I48.19 Other persistent atrial fibrillation 02/13/2021 No Yes E11.9 Type 2 diabetes mellitus without complications 12/19/6438 No Yes H47.42 Chronic systolic (congestive) heart failure 02/13/2021 No Yes Inactive Problems ICD-10 Code Description Active Date Inactive Date S81.811A Laceration without foreign body, right lower leg, initial encounter 04/10/2021 04/10/2021 Resolved Problems ICD-10 Code Description Active Date Resolved Date L97.829 Non-pressure chronic ulcer of other part of left lower leg with unspecified severity 02/13/2021 02/13/2021 Electronic Signature(s) Signed: 04/24/2021 4:06:27 PM By: [name] Entered By: Kalman Shan on 04/24/2021 16:03:23 -------------------------------------------------------------------------------- Progress Note Details Patient Name: Date of Service: [name] RD L. 04/24/2021 2:30 PM Medical Record Number: [id number] Patient Account Number: [id number] Date of Birth/Sex: Treating RN: January 14, 1956 (65 y.o. [name] Primary Care Provider: [name] Other Clinician: Referring Provider: Treating Provider/Extender: [name] in Treatment: 10 Subjective Chief Complaint Information obtained from Patient right lower extremity wound History of Present Illness (HPI) Admission 5/2 Mr. Masten is a 65 year old male with a past medical history of systolic congestive heart failure, COPD, nonischemic cardiomyopathy, type 2 diabetes, chronic venous insufficiency that presents to the clinic for a 15-month history of nonhealing wound to his left lower extremity. He states that in  November 2021 he fell off his porch and Hit his leg against a brick. The wound has healed mostly except for 1 area that is still open. He reports moderate Serosanguineous drainage daily. He has been keeping it covered with Band-Aids. He reports 2 rounds of antibiotics for this issue. He denies pain. He denies current acute signs of infection. 5/9; patient presents for 1 week follow-up. He has been using Hydrofera Blue under 3 layer compression. He has no issues or complaints today. He denies signs of infection. 5/16; patient presents for 1 week follow-up. He has been using Hydrofera Blue under 3 layer compression. He has no issues or complaints today. He denies signs of infection. He states he overall feels well 5/23; patient presents for 1 week follow-up. He has been using Hydrofera Blue under 3 layer compression. He has no issues or complaints today. He denies signs of infection. 6/6; patient presents for 2-week follow-up. He has been using Hydrofera Blue under 3 layer compression. He has had no issues with this. He denies any signs of infection and  has no complaints today. Readmissionoodifferent wound 6/27 patient developed another wound to his right lower extremity. He states that 1 week ago he had a large admission for a different wound Paining that was hung over his couch fell on his leg. He has been using alcohol and keeping the area covered. He denies signs of infection. 7/5; patient presents for 1 week follow-up. He has been tolerating Hydrofera Blue under 3 layer compression. He does report some pain to the wound site. He denies signs of infection. 7/11; patient presents for 1 week follow-up. He has been doing well with Hydrofera Blue under 3 layer compression. He reports improvement in pain since last clinic visit. He has no complaints today. He denies signs of infection. Patient History Information obtained from Patient. Family History Heart Disease - Father, Hypertension -  Mother,Father,Siblings, Lung Disease - Mother, Thyroid Problems - Mother, No family history of Cancer, Diabetes, Hereditary Spherocytosis, Kidney Disease, Seizures, Stroke, Tuberculosis. Social History Former smoker - quit 2009, Marital Status - Divorced, Alcohol Use - Never, Drug Use - No History, Caffeine Use - Daily - coffee, diet soda. Medical History Eyes Patient has history of Cataracts Denies history of Glaucoma, Optic Neuritis Hematologic/Lymphatic Patient has history of Anemia Respiratory Patient has history of Chronic Obstructive Pulmonary Disease (COPD), Sleep Apnea - sleeps in recliner Denies history of Asthma, Pneumothorax Cardiovascular Patient has history of Arrhythmia - afib, Congestive Heart Failure, Deep Vein Thrombosis, Myocardial Infarction, Peripheral Venous Disease Endocrine Patient has history of Type II Diabetes Denies history of Type I Diabetes Genitourinary Denies history of End Stage Renal Disease Integumentary (Skin) Denies history of History of Burn Musculoskeletal Patient has history of Osteoarthritis Oncologic Denies history of Received Chemotherapy, Received Radiation Psychiatric Denies history of Anorexia/bulimia, Confinement Anxiety Medical A Surgical History Notes nd Constitutional Symptoms (General Health) obesity Cardiovascular cardiomyopathy, pacemaker/defibulator Gastrointestinal GERD Endocrine thyrotoxicosis Genitourinary hx kidney stones, BPH Musculoskeletal thoracic and lumbosacral spondylosis Objective Constitutional respirations regular, non-labored and within target range for patient.. Vitals Time Taken: 2:20 PM, Height: 70 in, Weight: 260 lbs, BMI: 37.3, Temperature: 97.9 F, Pulse: 84 bpm, Respiratory Rate: 20 breaths/min, Blood Pressure: 103/67 mmHg. Cardiovascular 2+ dorsalis pedis/posterior tibialis pulses. Psychiatric pleasant and cooperative. General Notes: Right lower extremity: Open wound to the anterior shin  with nonviable tissue and granulation tissue present. No increased warmth erythema or tenderness to palpation. No purulent drainage. Integumentary (Hair, Skin) Wound #2 status is Open. Original cause of wound was Skin T ear/Laceration. The date acquired was: 04/03/2021. The wound has been in treatment 2 weeks. The wound is located on the Right,Anterior Lower Leg. The wound measures 1cm length x 2.4cm width x 0.1cm depth; 1.885cm^2 area and 0.188cm^3 volume. There is Fat Layer (Subcutaneous Tissue) exposed. There is no tunneling or undermining noted. There is a medium amount of serosanguineous drainage noted. The wound margin is flat and intact. There is large (67-100%) red granulation within the wound bed. There is a small (1-33%) amount of necrotic tissue within the wound bed including Adherent Slough. Assessment Active Problems ICD-10 Laceration without foreign body, right lower leg, subsequent encounter Venous insufficiency (chronic) (peripheral) Essential (primary) hypertension Other cardiomyopathies Chronic obstructive pulmonary disease, unspecified Other persistent atrial fibrillation Type 2 diabetes mellitus without complications Chronic systolic (congestive) heart failure Patient's wound has improved in size and appearance since last clinic visit. I debrided nonviable tissue. I recommended continuing Hydrofera Blue with Santyl under Kerlix Coban. No signs of infection on exam. Follow-up in 1 week Procedures Wound #2 Pre-procedure  diagnosis of Wound #2 is a Skin T located on the Right,Anterior Lower Leg . There was a Excisional Skin/Subcutaneous Tissue Debridement ear with a total area of 2.4 sq cm performed by Kalman Shan, DO. With the following instrument(s): Curette Material removed includes Subcutaneous Tissue after achieving pain control using Lidocaine 4% Topical Solution. No specimens were taken. A time out was conducted at 14:57, prior to the start of the procedure. A  Minimum amount of bleeding was controlled with Pressure. The procedure was tolerated well. Post Debridement Measurements: 1cm length x 2.4cm width x 0.1cm depth; 0.188cm^3 volume. Character of Wound/Ulcer Post Debridement is stable. Post procedure Diagnosis Wound #2: Same as Pre-Procedure Plan Follow-up Appointments: Return Appointment in 2 weeks. - with Dr. Heber [place] Nurse Visit: - Dressing change Bathing/ Shower/ Hygiene: May shower with protection but do not get wound dressing(s) wet. Edema Control - Lymphedema / SCD / Other: Elevate legs to the level of the heart or above for 30 minutes daily and/or when sitting, a frequency of: - throughout the day Avoid standing for long periods of time. Exercise regularly Moisturize legs daily. - left leg every night before bed. Compression stocking or Garment 20-30 mm/Hg pressure to: - left leg daily Additional Orders / Instructions: Follow Nutritious Diet WOUND #2: - Lower Leg Wound Laterality: Right, Anterior Cleanser: Normal Saline 1 x Per Week/ Discharge Instructions: Cleanse the wound with Normal Saline prior to applying a clean dressing using gauze sponges, not tissue or cotton balls. Cleanser: Soap and Water 1 x Per Week/ Discharge Instructions: May shower and wash wound with dial antibacterial soap and water prior to dressing change. Peri-Wound Care: Triamcinolone 15 (g) 1 x Per Week/ Discharge Instructions: Use triamcinolone 15 (g) as directed Peri-Wound Care: Sween Lotion (Moisturizing lotion) 1 x Per Week/ Discharge Instructions: Apply moisturizing lotion as directed Prim Dressing: Hydrofera Blue Ready Foam, 2.5 x2.5 in 1 x Per Week/ ary Discharge Instructions: Apply to wound bed as instructed Prim Dressing: Santyl Ointment 1 x Per Week/ ary Discharge Instructions: Apply nickel thick amount to wound bed as instructed Secondary Dressing: Woven Gauze Sponge, Non-Sterile 4x4 in 1 x Per Week/ Discharge Instructions: Apply over primary  dressing as directed. Secondary Dressing: ABD Pad, 5x9 1 x Per Week/ Discharge Instructions: Apply over primary dressing as directed. Com pression Wrap: Kerlix Roll 4.5x3.1 (in/yd) 1 x Per Week/ Discharge Instructions: Apply Kerlix and Coban compression as directed. Com pression Wrap: Coban Self-Adherent Wrap 4x5 (in/yd) 1 x Per Week/ Discharge Instructions: Apply over Kerlix as directed. 1. In office sharp debridement 2. Hydrofera Blue and Santyl under Kerlix/Coban 3. Follow-up in 1 week for nurse visit and with me in 2 weeks Electronic Signature(s) Signed: 04/24/2021 4:06:27 PM By: [name] Entered By: Kalman Shan on 04/24/2021 16:05:57 -------------------------------------------------------------------------------- HxROS Details Patient Name: Date of Service: [name], [name] 04/24/2021 2:30 PM Medical Record Number: [id number] Patient Account Number: [id number] Date of Birth/Sex: Treating RN: 05-19-1956 (65 y.o. [name] Primary Care Provider: [name] Other Clinician: Referring Provider: Treating Provider/Extender: [name] in Treatment: 10 Information Obtained From Patient Constitutional Symptoms (General Health) Medical History: Past Medical History Notes: obesity Eyes Medical History: Positive for: Cataracts Negative for: Glaucoma; Optic Neuritis Hematologic/Lymphatic Medical History: Positive for: Anemia Respiratory Medical History: Positive for: Chronic Obstructive Pulmonary Disease (COPD); Sleep Apnea - sleeps in recliner Negative for: Asthma; Pneumothorax Cardiovascular Medical History: Positive for: Arrhythmia - afib; Congestive Heart Failure; Deep Vein Thrombosis; Myocardial Infarction; Peripheral Venous  Disease Past Medical History Notes: cardiomyopathy, pacemaker/defibulator Gastrointestinal Medical History: Past Medical History Notes: GERD Endocrine Medical History: Positive for: Type II  Diabetes Negative for: Type I Diabetes Past Medical History Notes: thyrotoxicosis Time with diabetes: 10 yrs Treated with: Diet Blood sugar tested every day: No Genitourinary Medical History: Negative for: End Stage Renal Disease Past Medical History Notes: hx kidney stones, BPH Integumentary (Skin) Medical History: Negative for: History of Burn Musculoskeletal Medical History: Positive for: Osteoarthritis Past Medical History Notes: thoracic and lumbosacral spondylosis Oncologic Medical History: Negative for: Received Chemotherapy; Received Radiation Psychiatric Medical History: Negative for: Anorexia/bulimia; Confinement Anxiety HBO Extended History Items Eyes: Cataracts Immunizations Pneumococcal Vaccine: Received Pneumococcal Vaccination: Yes Implantable Devices Yes Family and Social History Cancer: No; Diabetes: No; Heart Disease: Yes - Father; Hereditary Spherocytosis: No; Hypertension: Yes - Mother,Father,Siblings; Kidney Disease: No; Lung Disease: Yes - Mother; Seizures: No; Stroke: No; Thyroid Problems: Yes - Mother; Tuberculosis: No; Former smoker - quit 2009; Marital Status - Divorced; Alcohol Use: Never; Drug Use: No History; Caffeine Use: Daily - coffee, diet soda; Financial Concerns: No; Food, Clothing or Shelter Needs: No; Support System Lacking: No; Transportation Concerns: No Electronic Signature(s) Signed: 04/24/2021 4:06:27 PM By: [name] Signed: 04/24/2021 5:19:06 PM By: [name] Entered By: Kalman Shan on 04/24/2021 16:01:20 -------------------------------------------------------------------------------- SuperBill Details Patient Name: Date of Service: [name], [name] 04/24/2021 Medical Record Number: [id number] Patient Account Number: [id number] Date of Birth/Sex: Treating RN: 04-21-1956 (65 y.o. [name] Primary Care Provider: [name] Other Clinician: Referring Provider: Treating Provider/Extender: [name] in Treatment: 10 Diagnosis Coding ICD-10 Codes Code Description [phone] Laceration without foreign body, right lower leg, initial encounter I87.2 Venous insufficiency (chronic) (peripheral) I10 Essential (primary) hypertension I42.8 Other cardiomyopathies J44.9 Chronic obstructive pulmonary disease, unspecified I48.19 Other persistent atrial fibrillation E11.9 Type 2 diabetes mellitus without complications T26.71 Chronic systolic (congestive) heart failure Facility Procedures CPT4 Code: [id number] 1 Description: 3382 - DEB SUBQ TISSUE 20 SQ CM/< ICD-10 Diagnosis Description S81.811A Laceration without foreign body, right lower leg, initial encounter Modifier: 1 Quantity: Physician Procedures : CPT4 Code Description Modifier [id number] 11042 - WC PHYS SUBQ TISS 20 SQ CM ICD-10 Diagnosis Description S81.811A Laceration without foreign body, right lower leg, initial encounter Quantity: 1 Electronic Signature(s) Signed: 04/24/2021 4:06:27 PM By: [name] Entered By: Kalman Shan on 04/24/2021 16:06:04

## 2021-04-25 ENCOUNTER — Other Ambulatory Visit: Payer: Self-pay | Admitting: Internal Medicine

## 2021-04-26 ENCOUNTER — Ambulatory Visit (INDEPENDENT_AMBULATORY_CARE_PROVIDER_SITE_OTHER): Payer: Medicare HMO

## 2021-04-26 DIAGNOSIS — I5022 Chronic systolic (congestive) heart failure: Secondary | ICD-10-CM

## 2021-04-26 LAB — CUP PACEART REMOTE DEVICE CHECK
Battery Remaining Longevity: 36 mo
Battery Voltage: 2.89 V
Brady Statistic AP VP Percent: 0 %
Brady Statistic AP VS Percent: 0 %
Brady Statistic AS VP Percent: 0 %
Brady Statistic AS VS Percent: 0 %
Brady Statistic RA Percent Paced: 0 %
Brady Statistic RV Percent Paced: 99.1 %
Date Time Interrogation Session: 20220713033423
HighPow Impedance: 74 Ohm
Implantable Lead Implant Date: 20181205
Implantable Lead Implant Date: 20181205
Implantable Lead Location: 753858
Implantable Lead Location: 753860
Implantable Lead Model: 4398
Implantable Pulse Generator Implant Date: 20181205
Lead Channel Impedance Value: 1007 Ohm
Lead Channel Impedance Value: 1026 Ohm
Lead Channel Impedance Value: 1026 Ohm
Lead Channel Impedance Value: 218.087
Lead Channel Impedance Value: 262.136
Lead Channel Impedance Value: 262.136
Lead Channel Impedance Value: 276.59 Ohm
Lead Channel Impedance Value: 276.59 Ohm
Lead Channel Impedance Value: 342 Ohm
Lead Channel Impedance Value: 361 Ohm
Lead Channel Impedance Value: 4047 Ohm
Lead Channel Impedance Value: 418 Ohm
Lead Channel Impedance Value: 456 Ohm
Lead Channel Impedance Value: 703 Ohm
Lead Channel Impedance Value: 703 Ohm
Lead Channel Impedance Value: 779 Ohm
Lead Channel Impedance Value: 817 Ohm
Lead Channel Impedance Value: 988 Ohm
Lead Channel Pacing Threshold Amplitude: 0.375 V
Lead Channel Pacing Threshold Pulse Width: 0.4 ms
Lead Channel Sensing Intrinsic Amplitude: 15.5 mV
Lead Channel Sensing Intrinsic Amplitude: 15.5 mV
Lead Channel Setting Pacing Amplitude: 2.25 V
Lead Channel Setting Pacing Amplitude: 2.5 V
Lead Channel Setting Pacing Pulse Width: 0.4 ms
Lead Channel Setting Pacing Pulse Width: 0.6 ms
Lead Channel Setting Sensing Sensitivity: 0.3 mV

## 2021-04-27 ENCOUNTER — Ambulatory Visit: Payer: Medicare HMO | Admitting: *Deleted

## 2021-04-27 DIAGNOSIS — I428 Other cardiomyopathies: Secondary | ICD-10-CM

## 2021-04-27 DIAGNOSIS — I4819 Other persistent atrial fibrillation: Secondary | ICD-10-CM

## 2021-04-28 NOTE — Patient Instructions (Signed)
Visit Information   [name], it was nice talking with you today.   Please read over the attached information, and keep up the great work taking your medications as they are prescribed and weighing yourself at home every day.    I look forward to talking to you again for an update on Monday, May 22, 2021 at 2:00 pm- please be listening out for my call that day.  I will call as close to 2:00 pm as possible; I look forward to hearing about your progress.   Please don't hesitate to contact me if I can be of assistance to you before our next scheduled appointment.   [name], RN, BSN, [facility] RN Care Coordination- [place] [phone]: direct office [phone]: mobile   PATIENT GOALS:   Goals Addressed             This Visit's Progress    Track and Manage Fluids and Swelling-Heart Failure   On track    Timeframe:  Long-Range Goal Priority:  Medium Start Date:        04/27/21                     Expected End Date:     04/27/22                  Follow Up Date 05/22/21    Continue to weigh yourself daily and write your weights at home down on paper or on the calendar- we will review these each time we have a telephone visit.  Today you reported a weight of 253.2 lbs Call office if I gain more than 2 pounds in one day or 5 pounds in one week, especially if this is associated with chest tightness, swelling in your legs/ ankles, and new or worsened shortness of breath Watch for swelling in feet, ankles and legs every day Keep legs up while sitting Limit the salt in your diet: decrease your salt intake and use salt only in moderation Please read over the attached information- we will review this information periodically during our phone conversations  Why is this important?   It is important to check your weight daily and watch how much salt and liquids you have.  It will help you to manage your heart failure.           Heart Failure Eating  Plan Heart failure, also called congestive heart failure, occurs when your heart does not pump blood well enough to meet your body's needs for oxygen-rich blood. Heart failure is a long-term (chronic) condition. Living with heart failure can be challenging. Following your health care provider's instructions about a healthy lifestyle and working with a dietitian to choose the right foods may help to improve your symptoms. An eating plan for someone with heart failure will include changes that limit the intake of salt (sodium) and unhealthy fat. What are tips for following this plan? Reading food labels Check food labels for the amount of sodium per serving. Choose foods that have less than 140 mg (milligrams) of sodium in each serving. Check food labels for the number of calories per serving. This is important if you need to limit your daily calorie intake to lose weight. Check food labels for the serving size. If you eat more than one serving, you will be eating more sodium and calories than what is listed on the label. Look for foods that are labeled as "sodium-free," "very low  sodium," or "low sodium." Foods labeled as "reduced sodium" or "lightly salted" may still have more sodium than what is recommended for you. Cooking Avoid adding salt when cooking. Ask your health care provider or dietitian before using salt substitutes. Season food with salt-free seasonings, spices, or herbs. Check the label of seasoning mixes to make sure they do not contain salt. Cook with heart-healthy oils, such as olive, canola, soybean, or sunflower oil. Do not fry foods. Cook foods using low-fat methods, such as baking, boiling, grilling, and broiling. Limit unhealthy fats when cooking by: Removing the skin from poultry, such as chicken. Removing all visible fats from meats. Skimming the fat off from stews, soups, and gravies before serving them. Meal planning  Limit your intake of: Processed, canned, or  prepackaged foods. Foods that are high in trans fat, such as fried foods. Sweets, desserts, sugary drinks, and other foods with added sugar. Full-fat dairy products, such as whole milk. Eat a balanced diet. This may include: 4-5 servings of fruit each day and 4-5 servings of vegetables each day. At each meal, try to fill one-half of your plate with fruits and vegetables. Up to 6-8 servings of whole grains each day. Up to 2 servings of lean meat, poultry, or fish each day. One serving of meat is equal to 3 oz (85 g). This is about the same size as a deck of cards. 2 servings of low-fat dairy each day. Heart-healthy fats. Healthy fats called omega-3 fatty acids are found in foods such as flaxseed and cold-water fish like sardines, salmon, and mackerel. Aim to eat 25-35 g (grams) of fiber a day. Foods that are high in fiber include apples, broccoli, carrots, beans, peas, and whole grains. Do not add salt or condiments that contain salt (such as soy sauce) to foods before eating. When eating at a restaurant, ask that your food be prepared with less salt or no salt, if possible. Try to eat 2 or more vegetarian meals each week. Eat more home-cooked food and eat less restaurant, buffet, and fast food.  General information Do not eat more than 2,300 mg of sodium a day. The amount of sodium that is recommended for you may be lower, depending on your condition. Maintain a healthy body weight as directed. Ask your health care provider what a healthy weight is for you. Check your weight every day. Work with your health care provider and dietitian to make a plan that is right for you to lose weight or maintain your current weight. Limit how much fluid you drink. Ask your health care provider or dietitian how much fluid you can have each day. Limit or avoid alcohol as told by your health care provider or dietitian. Recommended foods Fruits All fresh, frozen, and canned fruits. Dried fruits, such as  raisins, prunes,and cranberries. Vegetables All fresh vegetables. Vegetables that are frozen without sauce or added salt.Low-sodium or sodium-free canned vegetables. Grains Bread with less than 80 mg of sodium per slice. Whole-wheat pasta, quinoa, and brown rice. Oats and oatmeal. Barley. [place]. Grits and cream of wheat.Whole-grain and whole-wheat cold cereal. Meats and other protein foods Lean cuts of meat. Skinless chicken and [place]. Fish with high omega-3 fatty acids, such as salmon, sardines, and other cold-water fishes. Eggs. Driedbeans, peas, and edamame. Unsalted nuts and nut butters. Dairy Low-fat or nonfat (skim) milk and dried milk. Rice milk, soy milk, and almond milk. Low-fat or nonfatyogurt. Small amounts of reduced-sodium block cheese. Low-sodium cottage cheese. Fats and oils Olive,  canola, soybean, flaxseed, avocado, or sunflower oil. Sweets and desserts Applesauce. Granola bars. Sugar-free pudding and gelatin. Frozen fruit bars. Seasoning and other foods Fresh and dried herbs. Lemon or lime juice. Vinegar. Low-sodium ketchup.Salt-free marinades, salad dressings, sauces, and seasonings. The items listed above may not be a complete list of foods and beverages you can eat. Contact a dietitian for more information. Foods to avoid Fruits Fruits that are dried with sodium-containing preservatives. Vegetables Canned vegetables. Frozen vegetables with sauce or seasonings. Creamedvegetables. [place] fries. Onion rings. Pickled vegetables and sauerkraut. Grains Bread with more than 80 mg of sodium per slice. Hot or cold cereal with more than 140 mg sodium per serving. Salted pretzels and crackers. Prepackagedbreadcrumbs. Bagels, croissants, and biscuits. Meats and other protein foods Ribs and chicken wings. Bacon, ham, pepperoni, bologna, salami, and packaged luncheon meats. Hot dogs, bratwurst, and sausage. Canned meat. Smoked meat andfish. Salted nuts and seeds. Dairy Whole milk,  half-and-half, and cream. Buttermilk. Processed cheese, cheese spreads, and cheese curds. Regular cottage cheese. Feta cheese. Shreddedcheese. String cheese. Fats and oils Butter, lard, shortening, ghee, and bacon fat. Canned and packaged gravies. Seasoning and other foods Onion salt, garlic salt, table salt, and sea salt. Marinades. Regular salad dressings. Relishes, pickles, and olives. Meat flavorings and tenderizers, and bouillon cubes. Horseradish, ketchup, and mustard. Worcestershire sauce. Teriyaki sauce, soy sauce (including reduced sodium). Hot sauce and Tabasco sauce. Steak sauce, fish sauce, oyster sauce, and cocktail sauce. Tacoseasonings. Barbecue sauce. Tartar sauce. The items listed above may not be a complete list of foods and beverages you should avoid. Contact a dietitian for more information. Summary A heart failure eating plan includes changes that limit your intake of sodium and unhealthy fat, and it may help you lose weight or maintain a healthy weight. Your health care provider may also recommend limiting how much fluid you drink. Most people with heart failure should eat no more than 2,300 mg of salt (sodium) a day. The amount of sodium that is recommended for you may be lower, depending on your condition. Contact your health care provider or dietitian before making any major changes to your diet. This information is not intended to replace advice given to you by your health care provider. Make sure you discuss any questions you have with your healthcare provider. Document Revised: 05/16/2020 Document Reviewed: 05/16/2020 Elsevier Patient Education  2022 [employer].   Consent to CCM Services: [name] was given information about Chronic Care Management services today including:  CCM service includes personalized support from designated clinical staff supervised by his physician, including individualized plan of care and coordination with other care providers 24/7 contact  phone numbers for assistance for urgent and routine care needs. Service will only be billed when office clinical staff spend 20 minutes or more in a month to coordinate care. Only one practitioner may furnish and bill the service in a calendar month. The patient may stop CCM services at any time (effective at the end of the month) by phone call to the office staff. The patient will be responsible for cost sharing (co-pay) of up to 20% of the service fee (after annual deductible is met).  Patient agreed to services and verbal consent obtained.   Patient verbalizes understanding of instructions provided today and agrees to view in [place].  Telephone follow up appointment with care management team member scheduled for:  Monday, May 22, 2021 at 2:00 pm The patient has been provided with contact information for the care management team and  has been advised to call with any health related questions or concerns.   CLINICAL CARE PLAN: Patient Care Plan: Heart Failure (Adult)     Problem Identified: Symptom Exacerbation (Heart Failure)   Priority: Medium     Long-Range Goal: Symptom Exacerbation Prevented or Minimized   Start Date: 04/27/2021  Expected End Date: 04/27/2022  This Visit's Progress: On track  Priority: Medium  Note:   Current Barriers:  Ongoing need for reinforcement and support in self-health management of heart failure in setting of multiple comorbidities Fragile state of health, multiple progressing chronic health conditions Nurse Case Manager Clinical Goal(s):  Over the next 12 months, patient will verbalize ongoing understanding of Heart Failure Action Plan and when to call doctor, as evidenced by patient/ caregiver reporting during CCM RN CM outreach of: Completion of daily weight monitoring/ recording (notifying MD of 3 lb weight gain over night or 5 lb in a week) Adhering to low salt diet Taking prescribed medications as prescribed Interventions:  Collaboration with  [name], MD regarding development and update of comprehensive plan of care as evidenced by provider attestation and co-signature Inter-disciplinary care team collaboration (see longitudinal plan of care) Chart reviewed including relevant office notes, upcoming scheduled appointments, and lab results Discussed current  clinical condition with patient and confirmed no current clinical concerns; confirmed patient independently manages medications; patient reports good adherence to medication regimen and denies medication concerns  Medication review: agrees to complete at time of next scheduled CCM RN CM telephone outreach Discussed with patient his multiple chronic conditions: DM- currently under control, A1C 5.6 (09/16/20); A-fib under control with pacer, ICD; HTN; OSA Confirmed patient is taking diuretic as prescribed: reviewed role of diuretics in prevention of fluid overload and management of heart failure while taking care to prevent dehydration and maintain fluid balance  Reviewed recent provider office visits with patient: PCP 03/17/21; wound clinic visits: patient reports wound on (R) leg beginning to heal; reports remote wound on (L) LE has healed; orthopedic/ sports medicine provider appointments: patient reports his primary goal is to have (L) TKR; reports he is in constant pain and described mobility issues; hopeful once TKR is completed, he will be able to resume a more active life, lose weight, feel better; hopeful TKR will occur at time his (R) leg wound heals up, hopeful will be scheduled for August 2022 Basic overview and discussion of pathophysiology of heart failure education initiated: patient verbalizes good general understanding of baseline knowledge around same and could benefit from ongoing reinforcement and support Provided verbal education around signs/ symptoms yellow CHF zone/ weight gain guidelines in setting of CHF along with corresponding action plan:  patient verbalizes good  understanding of action plan, will benefit from ongoing reinforcement around same; today patient denies issues around breathing, LE swelling outside of baseline Assessed need for readable accurate scales in home: confirmed patient has scales and is monitoring weights daily with good understanding of rationale  Reviewed recent weights at home with patient/ caregiver: report consistent ranges between 250-255 lbs; weight reported today: 253.2 lbs Confirmed patient endorses "trying to" follow low salt, heart healthy diet-- feels he could use improvement in his diet; often goes out to eat and eats fast food; verbal education initiated around strategies to decrease salt intake- will provide printed/ written education Reviewed upcoming provider appointments with patient/ caregiver and confirmed that patient has plans to attend all as scheduled: 05/01/21- wound clinic; 05/16/21- coumadin clinic and sports medicine  provider office visit; 05/23/21- podiatry;  06/28/21- CHF clinic Discussed plans for CCM RN CM follow up, scheduled next telephone visit with patient Self-Care Activities:  Takes Heart Failure Medications as prescribed Weighs daily and record (notifying MD of 3 lb weight gain over night or 5 lb in a week) Verbalizes understanding of and follows CHF Action Plan Adheres to low sodium diet  Patient Goals:  Continue to weigh yourself daily and write your weights at home down on paper or on the calendar- we will review these each time we have a telephone visit.  Today you reported a weight of 253.2 lbs Call office if I gain more than 2 pounds in one day or 5 pounds in one week, especially if this is associated with chest tightness, swelling in your legs/ ankles, and new or worsened shortness of breath Watch for swelling in feet, ankles and legs every day Keep legs up while sitting Limit the salt in your diet: decrease your salt intake and use salt only in moderation Please read over the attached information-  we will review this information periodically during our phone conversations Follow Up Plan:  Telephone follow up appointment with care management team member scheduled for:  Monday, May 22, 2021 at 2:00 pm The patient has been provided with contact information for the care management team and has been advised to call with any health related questions or concerns.

## 2021-04-28 NOTE — Chronic Care Management (AMB) (Signed)
Chronic Care Management   CCM RN Visit Note  04/28/2021 Name: [name] MRN: [id number] DOB: 1956-10-15  Subjective: [name] is a 65 y.o. year old male who is a primary care patient of Zachary Borg, MD. The care management team was consulted for assistance with disease management and care coordination needs.    Engaged with patient by telephone for initial visit in response to provider referral for case management and/or care coordination services.   Consent to Services:  The patient was given information about Chronic Care Management services, agreed to services, and gave verbal consent 03/21/21 prior to initiation of services.  Please see initial visit note for detailed documentation.  Patient agreed to services and verbal consent obtained.   Assessment: Review of patient past medical history, allergies, medications, health status, including review of consultants reports, laboratory and other test data, was performed as part of comprehensive evaluation and provision of chronic care management services.   SDOH (Social Determinants of Health) assessments and interventions performed:  SDOH Interventions    Flowsheet Row Most Recent Value  SDOH Interventions   Food Insecurity Interventions Intervention Not Indicated  Housing Interventions Intervention Not Indicated  Transportation Interventions Intervention Not Indicated       CCM Care Plan  Allergies  Allergen Reactions   Amiodarone Other (See Comments)    hyperthyroidism   Statins Other (See Comments)    myalgia   Tape Other (See Comments)    Skin Tears Use Paper Tape Only    Outpatient Encounter Medications as of 04/27/2021  Medication Sig   acetaminophen (TYLENOL) 500 MG tablet Take 500 mg by mouth every 6 (six) hours as needed.   albuterol (VENTOLIN HFA) 108 (90 Base) MCG/ACT inhaler INHALE 2 PUFFS 4 TIMES A DAY as needed   ALPRAZolam (XANAX) 0.5 MG tablet TAKE 2 TABLETS BY MOUTH AT BEDTIME   AMBULATORY NON  FORMULARY MEDICATION Rolling walker with a bench   augmented betamethasone dipropionate (DIPROLENE-AF) 0.05 % cream Apply topically.   carisoprodol (SOMA) 350 MG tablet TAKE 1 TABLET BY MOUTH THREE TIMES A DAY AS NEEDED FOR MUSCLE SPASMS   carvedilol (COREG) 12.5 MG tablet Take 1 tablet (12.5 mg total) by mouth 2 (two) times daily with a meal.   Cholecalciferol 100 MCG (4000 UT) CAPS 1 tab by mouth once daily   dapagliflozin propanediol (FARXIGA) 10 MG TABS tablet Take 1 tablet (10 mg total) by mouth daily before breakfast.   digoxin (LANOXIN) 0.25 MG tablet TAKE 1/2 TABLET BY MOUTH EVERY DAY   doxepin (SINEQUAN) 10 MG capsule Take 2 capsules (20 mg total) by mouth at bedtime as needed.   Dupilumab (DUPIXENT) 300 MG/2ML SOPN Inject 1 Dose into the skin every 14 (fourteen) days.   Evolocumab (REPATHA SURECLICK) 062 MG/ML SOAJ Inject 1 pen into the skin every 14 (fourteen) days.   fluticasone (FLONASE) 50 MCG/ACT nasal spray PLACE 2 SPRAYS INTO BOTH NOSTRILS DAILY AS NEEDED FOR ALLERGIES OR RHINITIS.   hydrOXYzine (ATARAX/VISTARIL) 10 MG tablet TAKE 1 TABLET BY MOUTH 3 TIMES DAILY AS NEEDED FOR ITCHING.   lidocaine (LIDODERM) 5 % APPLY 1 PATCH TO AFFECTED AREA FOR UP TO 12 HOURS . DO NOT USE MORE THAN ONE PATCH IN 24 HOURS.   omeprazole (PRILOSEC) 20 MG capsule TAKE 1 CAPSULE BY MOUTH TWICE A DAY   potassium chloride SA (KLOR-CON) 20 MEQ tablet Take 1 tablet (20 mEq total) by mouth daily.   sacubitril-valsartan (ENTRESTO) 49-51 MG Take 0.5 tablets by  mouth 2 (two) times daily.   spironolactone (ALDACTONE) 25 MG tablet TAKE 1 TABLET BY MOUTH EVERY DAY   tamsulosin (FLOMAX) 0.4 MG CAPS capsule    torsemide (DEMADEX) 20 MG tablet TAKE 4 TABLETS (80 MG TOTAL) BY MOUTH 2 (TWO) TIMES DAILY.   traMADol (ULTRAM) 50 MG tablet TAKE 1 TABLET BY MOUTH DAILY AS NEEDED.   traZODone (DESYREL) 100 MG tablet TAKE 1 TABLET BY MOUTH EVERY DAY AT BEDTIME AS NEEDED   TUMS 500 MG chewable tablet Chew 500-2,000 mg by  mouth every 4 (four) hours as needed for indigestion or heartburn.    valACYclovir (VALTREX) 1000 MG tablet TAKE 1 TABLET BY MOUTH THREE TIMES A DAY   venlafaxine XR (EFFEXOR-XR) 75 MG 24 hr capsule TAKE 1 CAPSULE BY MOUTH DAILY WITH BREAKFAST.   warfarin (COUMADIN) 10 MG tablet Take 10 mg by mouth daily. Mon,wed,fri,sat   zolpidem (AMBIEN) 10 MG tablet TAKE 1 TABLET BY MOUTH AT BEDTIME AS NEEDED   No facility-administered encounter medications on file as of 04/27/2021.    Patient Active Problem List   Diagnosis Date Noted   Preop exam for internal medicine 03/04/2021   B12 deficiency 03/04/2021   Vitamin D deficiency 03/01/2021   Myalgia 12/01/2020   Laceration of left leg 09/17/2020   Statin myopathy 08/02/2020   Rotator cuff arthropathy of both shoulders 02/23/2020   (HFpEF) heart failure with preserved ejection fraction ([place]) 02/22/2020   ICD (implantable cardioverter-defibrillator) in place 12/27/2019   COVID-19 virus infection 12/09/2019   COVID-19 12/09/2019   Hypotension 12/09/2019   Upper respiratory infection 11/27/2019   COPD exacerbation ([place]) 11/27/2019   Elevated PSA 09/23/2019   Lip lesion 12/23/2018   Diabetic ulcer of left lower leg with necrosis of muscle ([place]) 12/05/2018   Chronic bursitis of left shoulder 07/01/2018   Pain due to total knee replacement ([place]) 03/31/2018   Rotator cuff arthropathy, left 02/13/2018   Right rotator cuff tear arthropathy 01/14/2018   Increased prostate specific antigen (PSA) velocity 09/26/2017   Trigger point of thoracic region 07/04/2017   Right lumbar radiculopathy 05/15/2017   Right leg swelling 05/09/2017   Glaucoma 04/18/2017   History of nasal polyposis 02/19/2017   Chronic rhinitis 02/19/2017   Microhematuria 12/04/2016   Encounter for well adult exam with abnormal findings 11/29/2015   Type II diabetes mellitus ([place])    Hyperlipidemia    Persistent atrial fibrillation ([place]) 03/30/2015   Syncope 03/30/2015   Anemia  02/23/2015   Thyrotoxicosis 02/23/2015   Hx of adenomatous colonic polyps 10/21/2014   De Quervain's tenosynovitis, right 04/30/2014   Osteoarthritis of right knee 02/19/2014   Encounter for therapeutic drug monitoring 11/17/2013   Thoracic degenerative disc disease 06/17/2013   Thoracic spondylosis without myelopathy 02/11/2013   Lumbosacral spondylosis without myelopathy 02/11/2013   Degenerative joint disease of knee, left 02/11/2013   COPD (chronic obstructive pulmonary disease) ([place]) 08/20/2012   Bundle branch block, right and extreme left anterior fascicular 05/05/2012   Nonischemic cardiomyopathy ([place]) 05/02/2012   Anticoagulated on warfarin 03/21/2012   Spongiotic dermatitis 09/26/2011   Past myocardial infarction 04/19/2011   UNSPECIFIED URETHRAL STRICTURE 12/26/2010   Chronic pain syndrome 05/18/2010   Obstructive sleep apnea 10/21/2007   NEPHROLITHIASIS, HX OF 10/21/2007   Anxiety 06/09/2007   GERD 06/09/2007   BENIGN PROSTATIC HYPERTROPHY 06/09/2007   Morbid obesity ([place]) 06/03/2007   Depression 06/03/2007   Essential hypertension 06/03/2007   INSOMNIA 06/03/2007   Conditions to be addressed/monitored:  Atrial Fibrillation, CHF/  cardiomyopathy  Care Plan : Heart Failure (Adult)  Updates made by Knox Royalty, RN since 04/28/2021 12:00 AM     Problem: Symptom Exacerbation (Heart Failure)   Priority: Medium     Long-Range Goal: Symptom Exacerbation Prevented or Minimized   Start Date: 04/27/2021  Expected End Date: 04/27/2022  This Visit's Progress: On track  Priority: Medium  Note:   Current Barriers:  Ongoing need for reinforcement and support in self-health management of heart failure in setting of multiple comorbidities Fragile state of health, multiple progressing chronic health conditions Nurse Case Manager Clinical Goal(s):  Over the next 12 months, patient will verbalize ongoing understanding of Heart Failure Action Plan and when to call doctor, as  evidenced by patient/ caregiver reporting during CCM RN CM outreach of: Completion of daily weight monitoring/ recording (notifying MD of 3 lb weight gain over night or 5 lb in a week) Adhering to low salt diet Taking prescribed medications as prescribed Interventions:  Collaboration with Zachary Borg, MD regarding development and update of comprehensive plan of care as evidenced by provider attestation and co-signature Inter-disciplinary care team collaboration (see longitudinal plan of care) Chart reviewed including relevant office notes, upcoming scheduled appointments, and lab results Discussed current  clinical condition with patient and confirmed no current clinical concerns; confirmed patient independently manages medications; patient reports good adherence to medication regimen and denies medication concerns  Medication review: agrees to complete at time of next scheduled CCM RN CM telephone outreach Discussed with patient his multiple chronic conditions: DM- currently under control, A1C 5.6 (09/16/20); A-fib under control with pacer, ICD; HTN; OSA Confirmed patient is taking diuretic as prescribed: reviewed role of diuretics in prevention of fluid overload and management of heart failure while taking care to prevent dehydration and maintain fluid balance  Reviewed recent provider office visits with patient: PCP 03/17/21; wound clinic visits: patient reports wound on (R) leg beginning to heal; reports remote wound on (L) LE has healed; orthopedic/ sports medicine provider appointments: patient reports his primary goal is to have (L) TKR; reports he is in constant pain and described mobility issues; hopeful once TKR is completed, he will be able to resume a more active life, lose weight, feel better; hopeful TKR will occur at time his (R) leg wound heals up, hopeful will be scheduled for August 2022 Basic overview and discussion of pathophysiology of heart failure education initiated: patient  verbalizes good general understanding of baseline knowledge around same and could benefit from ongoing reinforcement and support Provided verbal education around signs/ symptoms yellow CHF zone/ weight gain guidelines in setting of CHF along with corresponding action plan:  patient verbalizes good understanding of action plan, will benefit from ongoing reinforcement around same; today patient denies issues around breathing, LE swelling outside of baseline Assessed need for readable accurate scales in home: confirmed patient has scales and is monitoring weights daily with good understanding of rationale  Reviewed recent weights at home with patient/ caregiver: report consistent ranges between 250-255 lbs; weight reported today: 253.2 lbs Confirmed patient endorses "trying to" follow low salt, heart healthy diet-- feels he could use improvement in his diet; often goes out to eat and eats fast food; verbal education initiated around strategies to decrease salt intake- will provide printed/ written education Reviewed upcoming provider appointments with patient/ caregiver and confirmed that patient has plans to attend all as scheduled: 05/01/21- wound clinic; 05/16/21- coumadin clinic and sports medicine  provider office visit; 05/23/21- podiatry; 06/28/21- CHF clinic Discussed  plans for CCM RN CM follow up, scheduled next telephone visit with patient Self-Care Activities:  Takes Heart Failure Medications as prescribed Weighs daily and record (notifying MD of 3 lb weight gain over night or 5 lb in a week) Verbalizes understanding of and follows CHF Action Plan Adheres to low sodium diet  Patient Goals:  Continue to weigh yourself daily and write your weights at home down on paper or on the calendar- we will review these each time we have a telephone visit.  Today you reported a weight of 253.2 lbs Call office if I gain more than 2 pounds in one day or 5 pounds in one week, especially if this is associated with  chest tightness, swelling in your legs/ ankles, and new or worsened shortness of breath Watch for swelling in feet, ankles and legs every day Keep legs up while sitting Limit the salt in your diet: decrease your salt intake and use salt only in moderation Please read over the attached information- we will review this information periodically during our phone conversations Follow Up Plan:  Telephone follow up appointment with care management team member scheduled for:  Monday, May 22, 2021 at 2:00 pm The patient has been provided with contact information for the care management team and has been advised to call with any health related questions or concerns.      Plan: Telephone follow up appointment with care management team member scheduled for:  Monday, May 22, 2021 at 2:00 pm The patient has been provided with contact information for the care management team and has been advised to call with any health related questions or concerns  [name], RN, BSN, [place] [phone]: direct office [phone]: mobile

## 2021-05-01 ENCOUNTER — Encounter (HOSPITAL_BASED_OUTPATIENT_CLINIC_OR_DEPARTMENT_OTHER): Payer: Medicare HMO | Admitting: Internal Medicine

## 2021-05-01 ENCOUNTER — Other Ambulatory Visit: Payer: Self-pay

## 2021-05-01 DIAGNOSIS — I872 Venous insufficiency (chronic) (peripheral): Secondary | ICD-10-CM | POA: Diagnosis not present

## 2021-05-01 DIAGNOSIS — E11622 Type 2 diabetes mellitus with other skin ulcer: Secondary | ICD-10-CM | POA: Diagnosis not present

## 2021-05-01 DIAGNOSIS — I10 Essential (primary) hypertension: Secondary | ICD-10-CM

## 2021-05-01 DIAGNOSIS — I5022 Chronic systolic (congestive) heart failure: Secondary | ICD-10-CM

## 2021-05-01 DIAGNOSIS — S81811D Laceration without foreign body, right lower leg, subsequent encounter: Secondary | ICD-10-CM

## 2021-05-01 DIAGNOSIS — L97829 Non-pressure chronic ulcer of other part of left lower leg with unspecified severity: Secondary | ICD-10-CM | POA: Diagnosis not present

## 2021-05-01 DIAGNOSIS — I4819 Other persistent atrial fibrillation: Secondary | ICD-10-CM | POA: Diagnosis not present

## 2021-05-01 DIAGNOSIS — I110 Hypertensive heart disease with heart failure: Secondary | ICD-10-CM | POA: Diagnosis not present

## 2021-05-01 NOTE — Progress Notes (Signed)
Zachary, [name] ([id number]) Visit Report for 05/01/2021 Chief Complaint Document Details Patient Name: Date of Service: [name] RD L. 05/01/2021 1:30 PM Medical Record Number: [id number] Patient Account Number: [id number] Date of Birth/Sex: Treating RN: May 27, 1956 (65 y.o. [name] Primary Care Provider: [name] Other Clinician: Referring Provider: Treating Provider/Extender: [name] in Treatment: 11 Information Obtained from: Patient Chief Complaint right lower extremity wound Electronic Signature(s) Signed: 05/01/2021 5:31:06 PM By: [name] Entered By: Kalman Shan on 05/01/2021 16:11:45 -------------------------------------------------------------------------------- Debridement Details Patient Name: Date of Service: [name], [name] RD L. 05/01/2021 1:30 PM Medical Record Number: [id number] Patient Account Number: [id number] Date of Birth/Sex: Treating RN: [birth year] (65 y.o. [name] Primary Care Provider: [name] Other Clinician: Referring Provider: Treating Provider/Extender: [name] in Treatment: 11 Debridement Performed for Assessment: Wound #2 Right,Anterior Lower Leg Performed By: Physician Kalman Shan, DO Debridement Type: Debridement Level of Consciousness (Pre-procedure): Awake and Alert Pre-procedure Verification/Time Out Yes - 14:24 Taken: Start Time: 14:24 T Area Debrided (L x W): otal 0.7 (cm) x 2 (cm) = 1.4 (cm) Tissue and other material debrided: Viable, Non-Viable, Subcutaneous, Biofilm Level: Skin/Subcutaneous Tissue Debridement Description: Excisional Instrument: Curette Bleeding: Minimum Hemostasis Achieved: Pressure End Time: 14:25 Procedural Pain: 0 Post Procedural Pain: 0 Response to Treatment: Procedure was tolerated well Level of Consciousness (Post- Awake and Alert procedure): Post Debridement Measurements of Total Wound Length: (cm)  0.7 Width: (cm) 2 Depth: (cm) 0.1 Volume: (cm) 0.11 Character of Wound/Ulcer Post Debridement: Improved Post Procedure Diagnosis Same as Pre-procedure Electronic Signature(s) Signed: 05/01/2021 5:04:52 PM By: Levan Hurst RN, BSN Signed: 05/01/2021 5:31:06 PM By: [name] Entered By: Levan Hurst on 05/01/2021 14:24:52 -------------------------------------------------------------------------------- HPI Details Patient Name: Date of Service: [name], [name] RD L. 05/01/2021 1:30 PM Medical Record Number: [id number] Patient Account Number: [id number] Date of Birth/Sex: Treating RN: 02/18/1956 (65 y.o. [name] Primary Care Provider: [name] Other Clinician: Referring Provider: Treating Provider/Extender: [name] in Treatment: 11 History of Present Illness HPI Description: Admission 5/2 [name] is a 65 year old male with a past medical history of systolic congestive heart failure, COPD, nonischemic cardiomyopathy, type 2 diabetes, chronic venous insufficiency that presents to the clinic for a 41-month history of nonhealing wound to his left lower extremity. He states that in November 2021 he fell off his porch and Hit his leg against a brick. The wound has healed mostly except for 1 area that is still open. He reports moderate Serosanguineous drainage daily. He has been keeping it covered with Band-Aids. He reports 2 rounds of antibiotics for this issue. He denies pain. He denies current acute signs of infection. 5/9; patient presents for 1 week follow-up. He has been using Hydrofera Blue under 3 layer compression. He has no issues or complaints today. He denies signs of infection. 5/16; patient presents for 1 week follow-up. He has been using Hydrofera Blue under 3 layer compression. He has no issues or complaints today. He denies signs of infection. He states he overall feels well 5/23; patient presents for 1 week follow-up. He has  been using Hydrofera Blue under 3 layer compression. He has no issues or complaints today. He denies signs of infection. 6/6; patient presents for 2-week follow-up. He has been using Hydrofera Blue under 3 layer compression. He has had no issues with this. He denies any signs of infection and has no complaints today. Readmissiondifferent wound 6/27 patient developed another  wound to his right lower extremity. He states that 1 week ago he had a large admission for a different wound Paining that was hung over his couch fell on his leg. He has been using alcohol and keeping the area covered. He denies signs of infection. 7/5; patient presents for 1 week follow-up. He has been tolerating Hydrofera Blue under 3 layer compression. He does report some pain to the wound site. He denies signs of infection. 7/11; patient presents for 1 week follow-up. He has been doing well with Hydrofera Blue under 3 layer compression. He reports improvement in pain since last clinic visit. He has no complaints today. He denies signs of infection. 7/18; patient presents for 1 week follow-up. Has been doing well with Hydrofera Blue under 3 layer compression. He denies signs of infection. Electronic Signature(s) Signed: 05/01/2021 5:31:06 PM By: [name] Entered By: Kalman Shan on 05/01/2021 16:25:15 -------------------------------------------------------------------------------- Physical Exam Details Patient Name: Date of Service: [name] RD L. 05/01/2021 1:30 PM Medical Record Number: [id number] Patient Account Number: [id number] Date of Birth/Sex: Treating RN: 08/18/1956 (65 y.o. [name] Primary Care Provider: [name] Other Clinician: Referring Provider: Treating Provider/Extender: [name] in Treatment: 11 Constitutional respirations regular, non-labored and within target range for patient.[name] Psychiatric pleasant and cooperative. Notes Right lower  extremity: Open wound to the anterior shin with nonviable tissue and granulation tissue present. No increased warmth erythema or tenderness to palpation. No purulent drainage. Electronic Signature(s) Signed: 05/01/2021 5:31:06 PM By: [name] Entered By: Kalman Shan on 05/01/2021 16:26:13 -------------------------------------------------------------------------------- Physician Orders Details Patient Name: Date of Service: [name], [name] RD L. 05/01/2021 1:30 PM Medical Record Number: [id number] Patient Account Number: [id number] Date of Birth/Sex: Treating RN: [birth year] (65 y.o. [name] Primary Care Provider: [name] Other Clinician: Referring Provider: Treating Provider/Extender: [name] in Treatment: 11 Verbal / Phone Orders: No Diagnosis Coding ICD-10 Coding Code Description [id number] Laceration without foreign body, right lower leg, subsequent encounter I87.2 Venous insufficiency (chronic) (peripheral) I10 Essential (primary) hypertension I42.8 Other cardiomyopathies J44.9 Chronic obstructive pulmonary disease, unspecified I48.19 Other persistent atrial fibrillation E11.9 Type 2 diabetes mellitus without complications M22.63 Chronic systolic (congestive) heart failure Follow-up Appointments ppointment in 1 week. - with Dr. Heber [place] Return A Bathing/ Shower/ Hygiene May shower with protection but do not get wound dressing(s) wet. Edema Control - Lymphedema / SCD / Other Bilateral Lower Extremities Elevate legs to the level of the heart or above for 30 minutes daily and/or when sitting, a frequency of: - throughout the day Avoid standing for long periods of time. Exercise regularly Moisturize legs daily. - left leg every night before bed. Compression stocking or Garment 20-30 mm/Hg pressure to: - left leg daily Additional Orders / Instructions Follow Nutritious Diet Wound Treatment Wound #2 - Lower Leg Wound Laterality:  Right, Anterior Cleanser: Soap and Water 1 x Per Week Discharge Instructions: May shower and wash wound with dial antibacterial soap and water prior to dressing change. Peri-Wound Care: Triamcinolone 15 (g) 1 x Per Week Discharge Instructions: Use triamcinolone 15 (g) as directed Peri-Wound Care: Sween Lotion (Moisturizing lotion) 1 x Per Week Discharge Instructions: Apply moisturizing lotion as directed Prim Dressing: Hydrofera Blue Ready Foam, 2.5 x2.5 in ary 1 x Per Week Discharge Instructions: Apply to wound bed as instructed Prim Dressing: Santyl Ointment 1 x Per Week ary Discharge Instructions: Apply under Hydrofera Secondary Dressing: Woven Gauze Sponge, Non-Sterile 4x4 in 1 x Per  Week Discharge Instructions: Apply over primary dressing as directed. Secondary Dressing: ABD Pad, 5x9 1 x Per Week Discharge Instructions: Apply over primary dressing as directed. Compression Wrap: Kerlix Roll 4.5x3.1 (in/yd) 1 x Per Week Discharge Instructions: Apply Kerlix and Coban compression as directed. Compression Wrap: Coban Self-Adherent Wrap 4x5 (in/yd) 1 x Per Week Discharge Instructions: Apply over Kerlix as directed. Electronic Signature(s) Signed: 05/01/2021 5:31:06 PM By: [name] Entered By: Kalman Shan on 05/01/2021 16:26:26 -------------------------------------------------------------------------------- Problem List Details Patient Name: Date of Service: [name], [name] RD L. 05/01/2021 1:30 PM Medical Record Number: [id number] Patient Account Number: [id number] Date of Birth/Sex: Treating RN: [birth year] (65 y.o. [name] Primary Care Provider: [name] Other Clinician: Referring Provider: Treating Provider/Extender: [name] in Treatment: 11 Active Problems ICD-10 Encounter Code Description Active Date MDM Diagnosis S81.811D Laceration without foreign body, right lower leg, subsequent encounter 04/24/2021 No Yes I87.2 Venous  insufficiency (chronic) (peripheral) 02/13/2021 No Yes I10 Essential (primary) hypertension 02/13/2021 No Yes I42.8 Other cardiomyopathies 02/13/2021 No Yes J44.9 Chronic obstructive pulmonary disease, unspecified 02/13/2021 No Yes I48.19 Other persistent atrial fibrillation 02/13/2021 No Yes E11.9 Type 2 diabetes mellitus without complications 8/0/9983 No Yes J82.50 Chronic systolic (congestive) heart failure 02/13/2021 No Yes Inactive Problems ICD-10 Code Description Active Date Inactive Date S81.811A Laceration without foreign body, right lower leg, initial encounter 04/10/2021 04/10/2021 Resolved Problems ICD-10 Code Description Active Date Resolved Date L97.829 Non-pressure chronic ulcer of other part of left lower leg with unspecified severity 02/13/2021 02/13/2021 Electronic Signature(s) Signed: 05/01/2021 5:31:06 PM By: [name] Entered By: Kalman Shan on 05/01/2021 16:11:12 -------------------------------------------------------------------------------- Progress Note Details Patient Name: Date of Service: [name] RD L. 05/01/2021 1:30 PM Medical Record Number: [id number] Patient Account Number: [id number] Date of Birth/Sex: Treating RN: 01/10/1956 (65 y.o. [name] Primary Care Provider: [name] Other Clinician: Referring Provider: Treating Provider/Extender: [name] in Treatment: 11 Subjective Chief Complaint Information obtained from Patient right lower extremity wound History of Present Illness (HPI) Admission 5/2 Mr. Bandel is a 65 year old male with a past medical history of systolic congestive heart failure, COPD, nonischemic cardiomyopathy, type 2 diabetes, chronic venous insufficiency that presents to the clinic for a 3-month history of nonhealing wound to his left lower extremity. He states that in November 2021 he fell off his porch and Hit his leg against a brick. The wound has healed mostly except for 1 area that is  still open. He reports moderate Serosanguineous drainage daily. He has been keeping it covered with Band-Aids. He reports 2 rounds of antibiotics for this issue. He denies pain. He denies current acute signs of infection. 5/9; patient presents for 1 week follow-up. He has been using Hydrofera Blue under 3 layer compression. He has no issues or complaints today. He denies signs of infection. 5/16; patient presents for 1 week follow-up. He has been using Hydrofera Blue under 3 layer compression. He has no issues or complaints today. He denies signs of infection. He states he overall feels well 5/23; patient presents for 1 week follow-up. He has been using Hydrofera Blue under 3 layer compression. He has no issues or complaints today. He denies signs of infection. 6/6; patient presents for 2-week follow-up. He has been using Hydrofera Blue under 3 layer compression. He has had no issues with this. He denies any signs of infection and has no complaints today. Readmissionoodifferent wound 6/27 patient developed another wound to his right lower extremity. He states  that 1 week ago he had a large admission for a different wound Paining that was hung over his couch fell on his leg. He has been using alcohol and keeping the area covered. He denies signs of infection. 7/5; patient presents for 1 week follow-up. He has been tolerating Hydrofera Blue under 3 layer compression. He does report some pain to the wound site. He denies signs of infection. 7/11; patient presents for 1 week follow-up. He has been doing well with Hydrofera Blue under 3 layer compression. He reports improvement in pain since last clinic visit. He has no complaints today. He denies signs of infection. 7/18; patient presents for 1 week follow-up. Has been doing well with Hydrofera Blue under 3 layer compression. He denies signs of infection. Patient History Information obtained from Patient. Family History Heart Disease - Father,  Hypertension - Mother,Father,Siblings, Lung Disease - Mother, Thyroid Problems - Mother, No family history of Cancer, Diabetes, Hereditary Spherocytosis, Kidney Disease, Seizures, Stroke, Tuberculosis. Social History Former smoker - quit 2009, Marital Status - Divorced, Alcohol Use - Never, Drug Use - No History, Caffeine Use - Daily - coffee, diet soda. Medical History Eyes Patient has history of Cataracts Denies history of Glaucoma, Optic Neuritis Hematologic/Lymphatic Patient has history of Anemia Respiratory Patient has history of Chronic Obstructive Pulmonary Disease (COPD), Sleep Apnea - sleeps in recliner Denies history of Asthma, Pneumothorax Cardiovascular Patient has history of Arrhythmia - afib, Congestive Heart Failure, Deep Vein Thrombosis, Myocardial Infarction, Peripheral Venous Disease Endocrine Patient has history of Type II Diabetes Denies history of Type I Diabetes Genitourinary Denies history of End Stage Renal Disease Integumentary (Skin) Denies history of History of Burn Musculoskeletal Patient has history of Osteoarthritis Oncologic Denies history of Received Chemotherapy, Received Radiation Psychiatric Denies history of Anorexia/bulimia, Confinement Anxiety Medical A Surgical History Notes nd Constitutional Symptoms (General Health) obesity Cardiovascular cardiomyopathy, pacemaker/defibulator Gastrointestinal GERD Endocrine thyrotoxicosis Genitourinary hx kidney stones, BPH Musculoskeletal thoracic and lumbosacral spondylosis Objective Constitutional respirations regular, non-labored and within target range for patient.. Vitals Time Taken: 1:47 PM, Height: 70 in, Weight: 260 lbs, BMI: 37.3, Temperature: 98.4 F, Pulse: 74 bpm, Respiratory Rate: 17 breaths/min, Blood Pressure: 83/58 mmHg. Psychiatric pleasant and cooperative. General Notes: Right lower extremity: Open wound to the anterior shin with nonviable tissue and granulation tissue  present. No increased warmth erythema or tenderness to palpation. No purulent drainage. Integumentary (Hair, Skin) Wound #2 status is Open. Original cause of wound was Skin T ear/Laceration. The date acquired was: 04/03/2021. The wound has been in treatment 3 weeks. The wound is located on the Right,Anterior Lower Leg. The wound measures 0.7cm length x 2cm width x 0.1cm depth; 1.1cm^2 area and 0.11cm^3 volume. There is Fat Layer (Subcutaneous Tissue) exposed. There is no tunneling or undermining noted. There is a medium amount of serosanguineous drainage noted. The wound margin is flat and intact. There is large (67-100%) red granulation within the wound bed. There is a small (1-33%) amount of necrotic tissue within the wound bed including Adherent Slough. Assessment Active Problems ICD-10 Laceration without foreign body, right lower leg, subsequent encounter Venous insufficiency (chronic) (peripheral) Essential (primary) hypertension Other cardiomyopathies Chronic obstructive pulmonary disease, unspecified Other persistent atrial fibrillation Type 2 diabetes mellitus without complications Chronic systolic (congestive) heart failure Patient's wound continues to improve in size and appearance. I debrided nonviable tissue. No signs of infection on exam. I recommended continuing Santyl, Hydrofera Blue under Kerlix/Coban. We will see him back in 1 week Procedures Wound #2 Pre-procedure diagnosis of  Wound #2 is a Skin T located on the Right,Anterior Lower Leg . There was a Excisional Skin/Subcutaneous Tissue Debridement ear with a total area of 1.4 sq cm performed by Kalman Shan, DO. With the following instrument(s): Curette to remove Viable and Non-Viable tissue/material. Material removed includes Subcutaneous Tissue and Biofilm and. No specimens were taken. A time out was conducted at 14:24, prior to the start of the procedure. A Minimum amount of bleeding was controlled with Pressure.  The procedure was tolerated well with a pain level of 0 throughout and a pain level of 0 following the procedure. Post Debridement Measurements: 0.7cm length x 2cm width x 0.1cm depth; 0.11cm^3 volume. Character of Wound/Ulcer Post Debridement is improved. Post procedure Diagnosis Wound #2: Same as Pre-Procedure Plan Follow-up Appointments: Return Appointment in 1 week. - with Dr. Heber [place] Bathing/ Shower/ Hygiene: May shower with protection but do not get wound dressing(s) wet. Edema Control - Lymphedema / SCD / Other: Elevate legs to the level of the heart or above for 30 minutes daily and/or when sitting, a frequency of: - throughout the day Avoid standing for long periods of time. Exercise regularly Moisturize legs daily. - left leg every night before bed. Compression stocking or Garment 20-30 mm/Hg pressure to: - left leg daily Additional Orders / Instructions: Follow Nutritious Diet WOUND #2: - Lower Leg Wound Laterality: Right, Anterior Cleanser: Soap and Water 1 x Per Week/ Discharge Instructions: May shower and wash wound with dial antibacterial soap and water prior to dressing change. Peri-Wound Care: Triamcinolone 15 (g) 1 x Per Week/ Discharge Instructions: Use triamcinolone 15 (g) as directed Peri-Wound Care: Sween Lotion (Moisturizing lotion) 1 x Per Week/ Discharge Instructions: Apply moisturizing lotion as directed Prim Dressing: Hydrofera Blue Ready Foam, 2.5 x2.5 in 1 x Per Week/ ary Discharge Instructions: Apply to wound bed as instructed Prim Dressing: Santyl Ointment 1 x Per Week/ ary Discharge Instructions: Apply under Hydrofera Secondary Dressing: Woven Gauze Sponge, Non-Sterile 4x4 in 1 x Per Week/ Discharge Instructions: Apply over primary dressing as directed. Secondary Dressing: ABD Pad, 5x9 1 x Per Week/ Discharge Instructions: Apply over primary dressing as directed. Com pression Wrap: Kerlix Roll 4.5x3.1 (in/yd) 1 x Per Week/ Discharge Instructions:  Apply Kerlix and Coban compression as directed. Com pression Wrap: Coban Self-Adherent Wrap 4x5 (in/yd) 1 x Per Week/ Discharge Instructions: Apply over Kerlix as directed. 1. In office sharp debridement 2. Hydrofera Blue, Santyl under Kerlix/Coban 3. Follow-up in 1 week Electronic Signature(s) Signed: 05/01/2021 5:31:06 PM By: [name] Entered By: Kalman Shan on 05/01/2021 16:29:31 -------------------------------------------------------------------------------- HxROS Details Patient Name: Date of Service: [name], [name] RD L. 05/01/2021 1:30 PM Medical Record Number: [id number] Patient Account Number: [id number] Date of Birth/Sex: Treating RN: 01-11-1956 (65 y.o. [name] Primary Care Provider: [name] Other Clinician: Referring Provider: Treating Provider/Extender: [name] in Treatment: 11 Information Obtained From Patient Constitutional Symptoms (General Health) Medical History: Past Medical History Notes: obesity Eyes Medical History: Positive for: Cataracts Negative for: Glaucoma; Optic Neuritis Hematologic/Lymphatic Medical History: Positive for: Anemia Respiratory Medical History: Positive for: Chronic Obstructive Pulmonary Disease (COPD); Sleep Apnea - sleeps in recliner Negative for: Asthma; Pneumothorax Cardiovascular Medical History: Positive for: Arrhythmia - afib; Congestive Heart Failure; Deep Vein Thrombosis; Myocardial Infarction; Peripheral Venous Disease Past Medical History Notes: cardiomyopathy, pacemaker/defibulator Gastrointestinal Medical History: Past Medical History Notes: GERD Endocrine Medical History: Positive for: Type II Diabetes Negative for: Type I Diabetes Past Medical History Notes: thyrotoxicosis Time with diabetes:  10 yrs Treated with: Diet Blood sugar tested every day: No Genitourinary Medical History: Negative for: End Stage Renal Disease Past Medical History Notes: hx  kidney stones, BPH Integumentary (Skin) Medical History: Negative for: History of Burn Musculoskeletal Medical History: Positive for: Osteoarthritis Past Medical History Notes: thoracic and lumbosacral spondylosis Oncologic Medical History: Negative for: Received Chemotherapy; Received Radiation Psychiatric Medical History: Negative for: Anorexia/bulimia; Confinement Anxiety HBO Extended History Items Eyes: Cataracts Immunizations Pneumococcal Vaccine: Received Pneumococcal Vaccination: Yes Implantable Devices Yes Family and Social History Cancer: No; Diabetes: No; Heart Disease: Yes - Father; Hereditary Spherocytosis: No; Hypertension: Yes - Mother,Father,Siblings; Kidney Disease: No; Lung Disease: Yes - Mother; Seizures: No; Stroke: No; Thyroid Problems: Yes - Mother; Tuberculosis: No; Former smoker - quit 2009; Marital Status - Divorced; Alcohol Use: Never; Drug Use: No History; Caffeine Use: Daily - coffee, diet soda; Financial Concerns: No; Food, Clothing or Shelter Needs: No; Support System Lacking: No; Transportation Concerns: No Electronic Signature(s) Signed: 05/01/2021 5:04:52 PM By: Levan Hurst RN, BSN Signed: 05/01/2021 5:31:06 PM By: [name] Entered By: Kalman Shan on 05/01/2021 16:25:43 -------------------------------------------------------------------------------- SuperBill Details Patient Name: Date of Service: [name], [name] RD L. 05/01/2021 Medical Record Number: [id number] Patient Account Number: [id number] Date of Birth/Sex: Treating RN: [birth year] (65 y.o. [name] Primary Care Provider: [name] Other Clinician: Referring Provider: Treating Provider/Extender: [name] in Treatment: 11 Diagnosis Coding ICD-10 Codes Code Description [phone] Laceration without foreign body, right lower leg, subsequent encounter I87.2 Venous insufficiency (chronic) (peripheral) I10 Essential (primary)  hypertension I42.8 Other cardiomyopathies J44.9 Chronic obstructive pulmonary disease, unspecified I48.19 Other persistent atrial fibrillation E11.9 Type 2 diabetes mellitus without complications P53.00 Chronic systolic (congestive) heart failure Facility Procedures CPT4 Code: [id number] Description: 73567 - DEB SUBQ TISSUE 20 SQ CM/< ICD-10 Diagnosis Description S81.811D Laceration without foreign body, right lower leg, subsequent encounter I87.2 Venous insufficiency (chronic) (peripheral) O14.10 Chronic systolic (congestive) heart  failure I10 Essential (primary) hypertension Modifier: Quantity: 1 Physician Procedures : CPT4 Code Description Modifier [id number] - WC PHYS SUBQ TISS 20 SQ CM ICD-10 Diagnosis Description S81.811D Laceration without foreign body, right lower leg, subsequent encounter I87.2 Venous insufficiency (chronic) (peripheral) Z97.28 Chronic  systolic (congestive) heart failure I10 Essential (primary) hypertension Quantity: 1 Electronic Signature(s) Signed: 05/01/2021 5:31:06 PM By: [name] Entered By: Kalman Shan on 05/01/2021 16:29:54

## 2021-05-01 NOTE — Progress Notes (Addendum)
[name] ([id number]) Visit Report for 05/01/2021 Arrival Information Details Patient Name: Date of Service: [name] RD L. 05/01/2021 1:30 PM Medical Record Number: [id number] Patient Account Number: [id number] Date of Birth/Sex: Treating RN: 10/16/1955 (65 y.o. [name] Primary Care Roniya Tetro: [name] Other Clinician: Referring Orvan Papadakis: Treating Raksha Wolfgang/Extender: [name] in Treatment: 11 Visit Information History Since Last Visit Added or deleted any medications: No Patient Arrived: [name] Any new allergies or adverse reactions: No Arrival Time: 13:47 Had a fall or experienced change in No Accompanied By: self activities of daily living that may affect Transfer Assistance: None risk of falls: Patient Identification Verified: Yes Signs or symptoms of abuse/neglect since last visito No Secondary Verification Process Completed: Yes Hospitalized since last visit: No Patient Requires Transmission-Based Precautions: No Implantable device outside of the clinic excluding No Patient Has Alerts: Yes cellular tissue based products placed in the center Patient Alerts: R ABI= 1.33, L ABI=1.16 since last visit: Has Dressing in Place as Prescribed: Yes Has Compression in Place as Prescribed: Yes Pain Present Now: No Electronic Signature(s) Signed: 05/01/2021 5:08:20 PM By: Rhae Hammock RN Entered By: Rhae Hammock on 05/01/2021 13:47:51 -------------------------------------------------------------------------------- Encounter Discharge Information Details Patient Name: Date of Service: [name], [name] RD L. 05/01/2021 1:30 PM Medical Record Number: [id number] Patient Account Number: [id number] Date of Birth/Sex: Treating RN: [birth year] (65 y.o. [name] Primary Care [name]: [name] Other Clinician: Referring Tamecia Mcdougald: Treating Ova Gillentine/Extender: [name] in Treatment: 11 Encounter Discharge  Information Items Post Procedure Vitals Discharge Condition: Stable Temperature (F): 98.4 Ambulatory Status: Cane Pulse (bpm): 74 Discharge Destination: Home Respiratory Rate (breaths/min): 18 Transportation: Private Auto Blood Pressure (mmHg): 88/58 Accompanied By: self Schedule Follow-up Appointment: Yes Clinical Summary of Care: Patient Declined Electronic Signature(s) Signed: 05/01/2021 5:28:31 PM By: Baruch Gouty RN, BSN Entered By: Baruch Gouty on 05/01/2021 14:44:10 -------------------------------------------------------------------------------- Lower Extremity Assessment Details Patient Name: Date of Service: [name] RD L. 05/01/2021 1:30 PM Medical Record Number: [id number] Patient Account Number: [id number] Date of Birth/Sex: Treating RN: [birth year] (65 y.o. [name] Primary Care Glendi Mohiuddin: [name] Other Clinician: Referring Chrystle Murillo: Treating Jerricka Carvey/Extender: [name] in Treatment: 11 Edema Assessment Assessed: [name]: No] [name]: Yes] Edema: [Left: N] [Right: o] Calf Left: Right: Point of Measurement: 32 cm From Medial Instep 35 cm Ankle Left: Right: Point of Measurement: 10 cm From Medial Instep 22 cm Vascular Assessment Pulses: Dorsalis Pedis Palpable: [Right:Yes] Posterior Tibial Palpable: [Right:Yes] Electronic Signature(s) Signed: 05/01/2021 5:08:20 PM By: Rhae Hammock RN Entered By: Rhae Hammock on 05/01/2021 13:51:35 -------------------------------------------------------------------------------- Multi Wound Chart Details Patient Name: Date of Service: [name], [name] RD L. 05/01/2021 1:30 PM Medical Record Number: [id number] Patient Account Number: [id number] Date of Birth/Sex: Treating RN: 06-05-1956 (65 y.o. [name] Primary Care Jairen Goldfarb: [name] Other Clinician: Referring Mahitha Hickling: Treating Zeric Baranowski/Extender: [name] in Treatment: 11 Vital  Signs Height(in): 70 Pulse(bpm): 58 Weight(lbs): 260 Blood Pressure(mmHg): 83/58 Body Mass Index(BMI): 37 Temperature(F): 98.4 Respiratory Rate(breaths/min): 17 Photos: [2:Right, Anterior Lower Leg] [N/A:N/A N/A] Wound Location: [2:Skin T ear/Laceration] [N/A:N/A] Wounding Event: [2:Skin T ear] [N/A:N/A] Primary Etiology: [2:Cataracts, Anemia, Chronic] [N/A:N/A] Comorbid History: [2:Obstructive Pulmonary Disease (COPD), Sleep Apnea, Arrhythmia, Congestive Heart Failure, Deep Vein Thrombosis, Myocardial Infarction, Peripheral Venous Disease, Type II Diabetes, Osteoarthritis 04/03/2021] [N/A:N/A] Date Acquired: [2:3] [N/A:N/A] Weeks of Treatment: [2:Open] [N/A:N/A] Wound Status: [2:0.7x2x0.1] [N/A:N/A] Measurements L x W x D (cm) [2:1.1] [N/A:N/A] A (cm) :  rea [2:0.11] [N/A:N/A] Volume (cm) : [2:80.00%] [N/A:N/A] % Reduction in A [2:rea: 80.00%] [N/A:N/A] % Reduction in Volume: [2:Full Thickness Without Exposed] [N/A:N/A] Classification: [2:Support Structures Medium] [N/A:N/A] Exudate A mount: [2:Serosanguineous] [N/A:N/A] Exudate Type: [2:red, brown] [N/A:N/A] Exudate Color: [2:Flat and Intact] [N/A:N/A] Wound Margin: [2:Large (67-100%)] [N/A:N/A] Granulation A mount: [2:Red] [N/A:N/A] Granulation Quality: [2:Small (1-33%)] [N/A:N/A] Necrotic A mount: [2:Fat Layer (Subcutaneous Tissue): Yes N/A] Exposed Structures: [2:Fascia: No Tendon: No Muscle: No Joint: No Bone: No Medium (34-66%)] [N/A:N/A] Epithelialization: [2:Debridement - Excisional] [N/A:N/A] Debridement: Pre-procedure Verification/Time Out 14:24 [N/A:N/A] Taken: [2:Subcutaneous] [N/A:N/A] Tissue Debrided: [2:Skin/Subcutaneous Tissue] [N/A:N/A] Level: [2:1.4] [N/A:N/A] Debridement A (sq cm): [2:rea Curette] [N/A:N/A] Instrument: [2:Minimum] [N/A:N/A] Bleeding: [2:Pressure] [N/A:N/A] Hemostasis A chieved: [2:0] [N/A:N/A] Procedural Pain: [2:0] [N/A:N/A] Post Procedural Pain: [2:Procedure was tolerated well]  [N/A:N/A] Debridement Treatment Response: [2:0.7x2x0.1] [N/A:N/A] Post Debridement Measurements L x W x D (cm) [2:0.11] [N/A:N/A] Post Debridement Volume: (cm) [2:Debridement] [N/A:N/A] Treatment Notes Wound #2 (Lower Leg) Wound Laterality: Right, Anterior Cleanser Soap and Water Discharge Instruction: May shower and wash wound with dial antibacterial soap and water prior to dressing change. Peri-Wound Care Triamcinolone 15 (g) Discharge Instruction: Use triamcinolone 15 (g) as directed Sween Lotion (Moisturizing lotion) Discharge Instruction: Apply moisturizing lotion as directed Topical Primary Dressing Hydrofera Blue Ready Foam, 2.5 x2.5 in Discharge Instruction: Apply to wound bed as instructed Santyl Ointment Discharge Instruction: Apply under Hydrofera Secondary Dressing Woven Gauze Sponge, Non-Sterile 4x4 in Discharge Instruction: Apply over primary dressing as directed. ABD Pad, 5x9 Discharge Instruction: Apply over primary dressing as directed. Secured With Compression Wrap Kerlix Roll 4.5x3.1 (in/yd) Discharge Instruction: Apply Kerlix and Coban compression as directed. Coban Self-Adherent Wrap 4x5 (in/yd) Discharge Instruction: Apply over Kerlix as directed. Compression Stockings Add-Ons Electronic Signature(s) Signed: 05/01/2021 5:04:52 PM By: Levan Hurst RN, BSN Signed: 05/01/2021 5:31:06 PM By: [name] Entered By: Kalman Shan on 05/01/2021 16:11:19 -------------------------------------------------------------------------------- Multi-Disciplinary Care Plan Details Patient Name: Date of Service: [name], [name] RD L. 05/01/2021 1:30 PM Medical Record Number: [id number] Patient Account Number: [id number] Date of Birth/Sex: Treating RN: 1955-12-19 (65 y.o. [name] Primary Care Zaynab Chipman: [name] Other Clinician: Referring Cressie Betzler: Treating Annalynne Ibanez/Extender: [name] in Treatment: [place] reviewed with physician Active Inactive Electronic Signature(s) Signed: 07/13/2021 4:51:27 PM By: Levan Hurst RN, BSN Previous Signature: 05/01/2021 5:04:52 PM Version By: Levan Hurst RN, BSN Entered By: Levan Hurst on 05/22/2021 14:59:54 -------------------------------------------------------------------------------- Pain Assessment Details Patient Name: Date of Service: [name] RD L. 05/01/2021 1:30 PM Medical Record Number: [id number] Patient Account Number: [id number] Date of Birth/Sex: Treating RN: Nov 25, 1955 (65 y.o. [name] Primary Care Trayquan Kolakowski: [name] Other Clinician: Referring Gladstone Rosas: Treating Chai Verdejo/Extender: [name] in Treatment: 11 Active Problems Location of Pain Severity and Description of Pain Patient Has Paino No Site Locations Pain Management and Medication Current Pain Management: Electronic Signature(s) Signed: 05/01/2021 5:08:20 PM By: Rhae Hammock RN Entered By: Rhae Hammock on 05/01/2021 13:51:18 -------------------------------------------------------------------------------- Patient/Caregiver Education Details Patient Name: Date of Service: [name] RD L. 7/18/2022andnbsp1:30 PM Medical Record Number: [id number] Patient Account Number: [id number] Date of Birth/Gender: Treating RN: [birth year] (65 y.o. [name] Primary Care Physician: [name] Other Clinician: Referring Physician: Treating Physician/Extender: [name] in Treatment: 11 Education Assessment Education Provided To: Patient Education Topics Provided Wound/Skin Impairment: Methods: Explain/Verbal Responses: State content correctly [employer]) Signed: 05/01/2021 5:04:52 PM By: Levan Hurst RN, BSN Entered By: Levan Hurst on  05/01/2021 14:48:46 -------------------------------------------------------------------------------- Wound Assessment  Details Patient Name: Date of Service: [name] RD L. 05/01/2021 1:30 PM Medical Record Number: [id number] Patient Account Number: [id number] Date of Birth/Sex: Treating RN: 10-18-1955 (65 y.o. [name] Primary Care Mekisha Bittel: [name] Other Clinician: Referring Yarely Bebee: Treating Marysa Wessner/Extender: [name] in Treatment: 11 Wound Status Wound Number: 2 Primary Skin T ear Etiology: Wound Location: Right, Anterior Lower Leg Wound Open Wounding Event: Skin Tear/Laceration Status: Date Acquired: 04/03/2021 Comorbid Cataracts, Anemia, Chronic Obstructive Pulmonary Disease Weeks Of Treatment: 3 History: (COPD), Sleep Apnea, Arrhythmia, Congestive Heart Failure, Deep Clustered Wound: No Vein Thrombosis, Myocardial Infarction, Peripheral Venous Disease, Type II Diabetes, Osteoarthritis Photos Wound Measurements Length: (cm) 0.7 Width: (cm) 2 Depth: (cm) 0.1 Area: (cm) 1.1 Volume: (cm) 0.11 % Reduction in Area: 80% % Reduction in Volume: 80% Epithelialization: Medium (34-66%) Tunneling: No Undermining: No Wound Description Classification: Full Thickness Without Exposed Support Structures Wound Margin: Flat and Intact Exudate Amount: Medium Exudate Type: Serosanguineous Exudate Color: red, brown Foul Odor After Cleansing: No Slough/Fibrino Yes Wound Bed Granulation Amount: Large (67-100%) Exposed Structure Granulation Quality: Red Fascia Exposed: No Necrotic Amount: Small (1-33%) Fat Layer (Subcutaneous Tissue) Exposed: Yes Necrotic Quality: Adherent Slough Tendon Exposed: No Muscle Exposed: No Joint Exposed: No Bone Exposed: No Electronic Signature(s) Signed: 05/01/2021 3:49:49 PM By: Sandre Kitty Signed: 05/01/2021 5:08:20 PM By: Rhae Hammock RN Entered By: Sandre Kitty on 05/01/2021 15:48:37 -------------------------------------------------------------------------------- Vitals Details Patient Name: Date of  Service: [name], [name] RD L. 05/01/2021 1:30 PM Medical Record Number: [id number] Patient Account Number: [id number] Date of Birth/Sex: Treating RN: June 10, 1956 (65 y.o. [name] Primary Care Lalita Ebel: [name] Other Clinician: Referring Roselind Klus: Treating Mella Inclan/Extender: [name] in Treatment: 11 Vital Signs Time Taken: 13:47 Temperature (F): 98.4 Height (in): 70 Pulse (bpm): 74 Weight (lbs): 260 Respiratory Rate (breaths/min): 17 Body Mass Index (BMI): 37.3 Blood Pressure (mmHg): 83/58 Reference Range: 80 - 120 mg / dl Electronic Signature(s) Signed: 05/01/2021 5:08:20 PM By: Rhae Hammock RN Entered By: Rhae Hammock on 05/01/2021 13:51:12

## 2021-05-04 ENCOUNTER — Telehealth: Payer: Self-pay

## 2021-05-04 NOTE — Telephone Encounter (Signed)
If unable to eat, needs to go to [facility] UC or ED now - the main consideration being something like C Diff colitis or other problem causing the abd pain that we would not be able to quickly evaluate here

## 2021-05-04 NOTE — Telephone Encounter (Signed)
Pt states he has had Diarrhea x4 days, feeling very weak x2 days, unable to eat, severe lower abdomen pain. No other sxs at this time.  **Sent to [facility] to advise.

## 2021-05-04 NOTE — Telephone Encounter (Signed)
LDVM for pt of Dr. John's instructions.  

## 2021-05-04 NOTE — Progress Notes (Signed)
GIONNI, VACA ([id number]) Visit Report for 04/10/2021 Arrival Information Details Patient Name: Date of Service: [name] RD L. 04/10/2021 12:30 PM Medical Record Number: [id number] Patient Account Number: [id number] Date of Birth/Sex: Treating RN: Jan 13, 1956 (65 y.o. [name], Meta.Reding Primary Care Domingos Riggi: [name] Other Clinician: Referring Savahna Casados: Treating Kahleel Fadeley/Extender: [name] in Treatment: 8 Visit Information History Since Last Visit All ordered tests and consults were completed: No Patient Arrived: Ambulatory Added or deleted any medications: No Arrival Time: 12:32 Any new allergies or adverse reactions: No Accompanied By: alone Had a fall or experienced change in No Transfer Assistance: None activities of daily living that may affect Patient Identification Verified: Yes risk of falls: Secondary Verification Process Completed: Yes Signs or symptoms of abuse/neglect since last visito No Patient Requires Transmission-Based Precautions: No Hospitalized since last visit: No Patient Has Alerts: Yes Implantable device outside of the clinic excluding No Patient Alerts: R ABI= 1.33, L ABI=1.16 cellular tissue based products placed in the center since last visit: Pain Present Now: No Electronic Signature(s) Signed: 05/03/2021 9:06:10 PM By: Deon Pilling Previous Signature: 05/01/2021 9:10:28 PM Version By: Deon Pilling Entered By: Deon Pilling on 05/03/2021 21:05:20 -------------------------------------------------------------------------------- Encounter Discharge Information Details Patient Name: Date of Service: [name] RD L. 04/10/2021 12:30 PM Medical Record Number: [id number] Patient Account Number: [id number] Date of Birth/Sex: Treating RN: 10/28/1955 (65 y.o. [name] Primary Care Jerimiah Wolman: [name] Other Clinician: Referring Hoke Baer: Treating Claudell Wohler/Extender: [name] in Treatment:  8 Encounter Discharge Information Items Post Procedure Vitals Discharge Condition: Stable Temperature (F): 97.8 Ambulatory Status: Cane Pulse (bpm): 78 Discharge Destination: Home Respiratory Rate (breaths/min): 20 Transportation: Private Auto Blood Pressure (mmHg): 90/62 Accompanied By: alone Schedule Follow-up Appointment: Yes Clinical Summary of Care: Patient Declined Electronic Signature(s) Signed: 04/11/2021 5:39:04 PM By: Levan Hurst RN, BSN Entered By: Levan Hurst on 04/10/2021 14:32:38 -------------------------------------------------------------------------------- Lower Extremity Assessment Details Patient Name: Date of Service: [name] RD L. 04/10/2021 12:30 PM Medical Record Number: [id number] Patient Account Number: [id number] Date of Birth/Sex: Treating RN: 04-Apr-1956 (65 y.o. [name] Primary Care Zoii Florer: [name] Other Clinician: Referring Pat Sires: Treating Lucita Montoya/Extender: [name] in Treatment: 8 Edema Assessment Assessed: [name]: No] [Right: No] Edema: [Left: Ye] [Right: s] Calf Left: Right: Point of Measurement: 32 cm From Medial Instep 34.1 cm 37.5 cm Ankle Left: Right: Point of Measurement: 10 cm From Medial Instep 20.4 cm 22 cm Knee To Floor Left: Right: From Medial Instep 44 cm 44 cm Vascular Assessment Pulses: Dorsalis Pedis Palpable: [Left:Yes] [Right:Yes] Electronic Signature(s) Signed: 05/03/2021 9:06:10 PM By: Deon Pilling Previous Signature: 05/01/2021 9:10:28 PM Version By: Deon Pilling Entered By: Deon Pilling on 05/03/2021 21:05:39 -------------------------------------------------------------------------------- Multi Wound Chart Details Patient Name: Date of Service: [name] RD L. 04/10/2021 12:30 PM Medical Record Number: [id number] Patient Account Number: [id number] Date of Birth/Sex: Treating RN: 13-Nov-1955 (65 y.o. [name] Primary Care Nathanael Krist: [name] Other  Clinician: Referring Deshannon Seide: Treating Shamira Toutant/Extender: [name] in Treatment: 8 Vital Signs Height(in): 33 Pulse(bpm): 57 Weight(lbs): 21 Blood Pressure(mmHg): 90/62 Body Mass Index(BMI): 37 Temperature(F): 97.8 Respiratory Rate(breaths/min): 20 Photos: [2:No Photos Right, Anterior Lower Leg] [N/A:N/A N/A] Wound Location: [2:Skin Tear/Laceration] [N/A:N/A] Wounding Event: [2:Skin Tear] [N/A:N/A] Primary Etiology: [2:Cataracts, Anemia, Chronic] [N/A:N/A] Comorbid History: [2:Obstructive Pulmonary Disease (COPD), Sleep Apnea, Arrhythmia, Congestive Heart Failure, Deep Vein Thrombosis, Myocardial Infarction, Peripheral Venous Disease, Type II Diabetes, Osteoarthritis 04/03/2021] [N/A:N/A] Date Acquired: [  2:0] [N/A:N/A] Weeks of Treatment: [2:Open] [N/A:N/A] Wound Status: [2:2x3.5x0.1] [N/A:N/A] Measurements L x W x D (cm) [2:5.498] [N/A:N/A] A (cm) : rea [2:0.55] [N/A:N/A] Volume (cm) : [2:Full Thickness Without Exposed] [N/A:N/A] Classification: [2:Support Structures Medium] [N/A:N/A] Exudate A mount: [2:Serosanguineous] [N/A:N/A] Exudate Type: [2:red, brown] [N/A:N/A] Exudate Color: [2:Flat and Intact] [N/A:N/A] Wound Margin: [2:Large (67-100%)] [N/A:N/A] Granulation A mount: [2:Red] [N/A:N/A] Granulation Quality: [2:None Present (0%)] [N/A:N/A] Necrotic A mount: [2:Fat Layer (Subcutaneous Tissue): Yes N/A] Exposed Structures: [2:Fascia: No Tendon: No Muscle: No Joint: No Bone: No None] [N/A:N/A] Epithelialization: [2:Debridement - Excisional] [N/A:N/A] Debridement: Pre-procedure Verification/Time Out 13:02 [N/A:N/A] Taken: [2:Lidocaine 4% Topical Solution] [N/A:N/A] Pain Control: [2:Subcutaneous] [N/A:N/A] Tissue Debrided: [2:Skin/Subcutaneous Tissue] [N/A:N/A] Level: [2:2] [N/A:N/A] Debridement A (sq cm): [2:rea Forceps, Scissors] [N/A:N/A] Instrument: [2:Minimum] [N/A:N/A] Bleeding: [2:Pressure] [N/A:N/A] Hemostasis A chieved:  [2:Procedure was tolerated well] [N/A:N/A] Debridement Treatment Response: [2:2x3.5x0.1] [N/A:N/A] Post Debridement Measurements L x W x D (cm) [2:0.55] [N/A:N/A] Post Debridement Volume: (cm) [2:Debridement] [N/A:N/A] Treatment Notes Electronic Signature(s) Signed: 04/10/2021 1:44:54 PM By: [name] Signed: 04/10/2021 5:08:20 PM By: [name] Entered By: Kalman Shan on 04/10/2021 13:40:29 -------------------------------------------------------------------------------- Multi-Disciplinary Care Plan Details Patient Name: Date of Service: [name] RD L. 04/10/2021 12:30 PM Medical Record Number: [id number] Patient Account Number: [id number] Date of Birth/Sex: Treating RN: [birth year] (65 y.o. [name] Primary Care Loui Massenburg: [name] Other Clinician: Referring Quinn Bartling: Treating Emelee Rodocker/Extender: [name] in Treatment: [place] reviewed with physician Active Inactive Wound/Skin Impairment Nursing Diagnoses: Impaired tissue integrity Knowledge deficit related to ulceration/compromised skin integrity Goals: Patient/caregiver will verbalize understanding of skin care regimen Date Initiated: 02/13/2021 Target Resolution Date: 05/08/2021 Goal Status: Active Ulcer/skin breakdown will have a volume reduction of 30% by week 4 Date Initiated: 02/13/2021 Target Resolution Date: 05/08/2021 Goal Status: Active Interventions: Assess patient/caregiver ability to obtain necessary supplies Assess patient/caregiver ability to perform ulcer/skin care regimen upon admission and as needed Assess ulceration(s) every visit Provide education on ulcer and skin care Treatment Activities: Skin care regimen initiated : 02/13/2021 Topical wound management initiated : 02/13/2021 Notes: Electronic Signature(s) Signed: 04/10/2021 12:52:58 PM By: [name] Entered By: [name] on 04/10/2021  12:52:57 -------------------------------------------------------------------------------- Pain Assessment Details Patient Name: Date of Service: [name] RD L. 04/10/2021 12:30 PM Medical Record Number: [id number] Patient Account Number: [id number] Date of Birth/Sex: Treating RN: 1956/01/25 (65 y.o. [name] Primary Care Redina Zeller: [name] Other Clinician: Referring Keelyn Fjelstad: Treating [place] Landgrebe/Extender: [name] in Treatment: 8 Active Problems Location of Pain Severity and Description of Pain Patient Has Paino No Site Locations Rate the pain. Current Pain Level: 0 Pain Management and Medication Current Pain Management: Electronic Signature(s) Signed: 05/03/2021 9:06:10 PM By: Deon Pilling Previous Signature: 05/01/2021 9:10:28 PM Version By: Deon Pilling Entered By: Deon Pilling on 05/03/2021 21:05:32 -------------------------------------------------------------------------------- Patient/Caregiver Education Details Patient Name: Date of Service: [name] RD L. 6/27/2022andnbsp12:30 PM Medical Record Number: [id number] Patient Account Number: [id number] Date of Birth/Gender: Treating RN: March 19, 1956 (65 y.o. [name] Primary Care Physician: [name] Other Clinician: Referring Physician: Treating Physician/Extender: [name] in Treatment: 8 Education Assessment Education Provided To: Patient Education Topics Provided Venous: Methods: Explain/Verbal, Printed Responses: State content correctly Wound/Skin Impairment: Methods: Explain/Verbal, Printed Responses: State content correctly Electronic Signature(s) Signed: 04/10/2021 5:08:20 PM By: [name] Entered By: [name] on 04/10/2021 12:54:37 -------------------------------------------------------------------------------- Wound Assessment Details Patient Name: Date of Service: [name], [name] 04/10/2021 12:30 PM  Medical  Record Number: [id number] Patient Account Number: [id number] Date of Birth/Sex: Treating RN: [birth year] (65 y.o. [name], Meta.Reding Primary Care Rheda Kassab: [name] Other Clinician: Referring Vineet Kinney: Treating Kearstyn Avitia/Extender: [name] in Treatment: 8 Wound Status Wound Number: 2 Primary Skin T ear Etiology: Wound Location: Right, Anterior Lower Leg Wound Open Wounding Event: Skin Tear/Laceration Status: Date Acquired: 04/03/2021 Comorbid Cataracts, Anemia, Chronic Obstructive Pulmonary Disease Weeks Of Treatment: 0 History: (COPD), Sleep Apnea, Arrhythmia, Congestive Heart Failure, Deep Clustered Wound: No Vein Thrombosis, Myocardial Infarction, Peripheral Venous Disease, Type II Diabetes, Osteoarthritis Photos Wound Measurements Length: (cm) 2 Width: (cm) 3.5 Depth: (cm) 0.1 Area: (cm) 5.498 Volume: (cm) 0.55 % Reduction in Area: 0% % Reduction in Volume: 0% Epithelialization: None Tunneling: No Undermining: No Wound Description Classification: Full Thickness Without Exposed Support Structures Wound Margin: Flat and Intact Exudate Amount: Medium Exudate Type: Serosanguineous Exudate Color: red, brown Foul Odor After Cleansing: No Slough/Fibrino No Wound Bed Granulation Amount: Large (67-100%) Exposed Structure Granulation Quality: Red Fascia Exposed: No Necrotic Amount: None Present (0%) Fat Layer (Subcutaneous Tissue) Exposed: Yes Tendon Exposed: No Muscle Exposed: No Joint Exposed: No Bone Exposed: No Treatment Notes Wound #2 (Lower Leg) Wound Laterality: Right, Anterior Cleanser Normal Saline Discharge Instruction: Cleanse the wound with Normal Saline prior to applying a clean dressing using gauze sponges, not tissue or cotton balls. Soap and Water Discharge Instruction: May shower and wash wound with dial antibacterial soap and water prior to dressing change. Peri-Wound Care Triamcinolone 15 (g) Discharge Instruction: Use  triamcinolone 15 (g) as directed Sween Lotion (Moisturizing lotion) Discharge Instruction: Apply moisturizing lotion as directed Topical Primary Dressing Hydrofera Blue Ready Foam, 2.5 x2.5 in Discharge Instruction: Apply to wound bed as instructed Santyl Ointment Discharge Instruction: Apply nickel thick amount to wound bed as instructed Secondary Dressing Woven Gauze Sponge, Non-Sterile 4x4 in Discharge Instruction: Apply over primary dressing as directed. ABD Pad, 5x9 Discharge Instruction: Apply over primary dressing as directed. Secured With Compression Wrap Kerlix Roll 4.5x3.1 (in/yd) Discharge Instruction: Apply Kerlix and Coban compression as directed. Coban Self-Adherent Wrap 4x5 (in/yd) Discharge Instruction: Apply over Kerlix as directed. Compression Stockings Add-Ons Electronic Signature(s) Signed: 05/03/2021 9:06:10 PM By: Deon Pilling Previous Signature: 04/10/2021 4:13:19 PM Version By: Sandre Kitty Previous Signature: 05/01/2021 9:10:28 PM Version By: Deon Pilling Entered By: Deon Pilling on 05/03/2021 21:05:51 -------------------------------------------------------------------------------- Vitals Details Patient Name: Date of Service: [name], [name] 04/10/2021 12:30 PM Medical Record Number: [id number] Patient Account Number: [id number] Date of Birth/Sex: Treating RN: 1956-01-05 (65 y.o. [name], Meta.Reding Primary Care Samyrah Bruster: [name] Other Clinician: Referring Omelia Marquart: Treating Kimothy Kishimoto/Extender: [name] in Treatment: 8 Vital Signs Time Taken: 12:34 Temperature (F): 97.8 Height (in): 70 Pulse (bpm): 78 Weight (lbs): 260 Respiratory Rate (breaths/min): 20 Body Mass Index (BMI): 37.3 Blood Pressure (mmHg): 90/62 Reference Range: 80 - 120 mg / dl Airway Pulse Oximetry (%): 94 Electronic Signature(s) Signed: 05/03/2021 9:06:10 PM By: Deon Pilling Previous Signature: 05/01/2021 9:10:28 PM Version By: Deon Pilling Entered By: Deon Pilling on 05/03/2021 21:05:26

## 2021-05-05 ENCOUNTER — Emergency Department (HOSPITAL_COMMUNITY): Payer: Medicare HMO

## 2021-05-05 ENCOUNTER — Encounter (HOSPITAL_COMMUNITY): Payer: Self-pay | Admitting: Pharmacy Technician

## 2021-05-05 ENCOUNTER — Inpatient Hospital Stay (HOSPITAL_COMMUNITY)
Admission: EM | Admit: 2021-05-05 | Discharge: 2021-05-10 | DRG: 377 | Disposition: A | Discharge status: Home / Self Care | Payer: Medicare HMO | Attending: Internal Medicine | Admitting: Internal Medicine

## 2021-05-05 ENCOUNTER — Other Ambulatory Visit: Payer: Self-pay

## 2021-05-05 DIAGNOSIS — Z6836 Body mass index (BMI) 36.0-36.9, adult: Secondary | ICD-10-CM | POA: Diagnosis not present

## 2021-05-05 DIAGNOSIS — K5733 Diverticulitis of large intestine without perforation or abscess with bleeding: Principal | ICD-10-CM | POA: Diagnosis present

## 2021-05-05 DIAGNOSIS — R1110 Vomiting, unspecified: Secondary | ICD-10-CM | POA: Diagnosis not present

## 2021-05-05 DIAGNOSIS — Z9109 Other allergy status, other than to drugs and biological substances: Secondary | ICD-10-CM

## 2021-05-05 DIAGNOSIS — K5793 Diverticulitis of intestine, part unspecified, without perforation or abscess with bleeding: Secondary | ICD-10-CM | POA: Diagnosis not present

## 2021-05-05 DIAGNOSIS — D649 Anemia, unspecified: Secondary | ICD-10-CM | POA: Diagnosis not present

## 2021-05-05 DIAGNOSIS — I442 Atrioventricular block, complete: Secondary | ICD-10-CM | POA: Diagnosis present

## 2021-05-05 DIAGNOSIS — K219 Gastro-esophageal reflux disease without esophagitis: Secondary | ICD-10-CM | POA: Diagnosis present

## 2021-05-05 DIAGNOSIS — I5022 Chronic systolic (congestive) heart failure: Secondary | ICD-10-CM | POA: Diagnosis not present

## 2021-05-05 DIAGNOSIS — Z96651 Presence of right artificial knee joint: Secondary | ICD-10-CM | POA: Diagnosis present

## 2021-05-05 DIAGNOSIS — I213 ST elevation (STEMI) myocardial infarction of unspecified site: Secondary | ICD-10-CM | POA: Diagnosis not present

## 2021-05-05 DIAGNOSIS — K922 Gastrointestinal hemorrhage, unspecified: Secondary | ICD-10-CM | POA: Diagnosis not present

## 2021-05-05 DIAGNOSIS — I130 Hypertensive heart and chronic kidney disease with heart failure and stage 1 through stage 4 chronic kidney disease, or unspecified chronic kidney disease: Secondary | ICD-10-CM | POA: Diagnosis present

## 2021-05-05 DIAGNOSIS — G4700 Insomnia, unspecified: Secondary | ICD-10-CM | POA: Diagnosis present

## 2021-05-05 DIAGNOSIS — R578 Other shock: Secondary | ICD-10-CM | POA: Diagnosis not present

## 2021-05-05 DIAGNOSIS — E669 Obesity, unspecified: Secondary | ICD-10-CM | POA: Diagnosis present

## 2021-05-05 DIAGNOSIS — Z79899 Other long term (current) drug therapy: Secondary | ICD-10-CM

## 2021-05-05 DIAGNOSIS — R1310 Dysphagia, unspecified: Secondary | ICD-10-CM | POA: Diagnosis present

## 2021-05-05 DIAGNOSIS — Z7901 Long term (current) use of anticoagulants: Secondary | ICD-10-CM

## 2021-05-05 DIAGNOSIS — Z8371 Family history of colonic polyps: Secondary | ICD-10-CM

## 2021-05-05 DIAGNOSIS — E1122 Type 2 diabetes mellitus with diabetic chronic kidney disease: Secondary | ICD-10-CM | POA: Diagnosis present

## 2021-05-05 DIAGNOSIS — K921 Melena: Secondary | ICD-10-CM | POA: Diagnosis present

## 2021-05-05 DIAGNOSIS — R197 Diarrhea, unspecified: Secondary | ICD-10-CM

## 2021-05-05 DIAGNOSIS — I252 Old myocardial infarction: Secondary | ICD-10-CM

## 2021-05-05 DIAGNOSIS — L97919 Non-pressure chronic ulcer of unspecified part of right lower leg with unspecified severity: Secondary | ICD-10-CM | POA: Diagnosis present

## 2021-05-05 DIAGNOSIS — Z9981 Dependence on supplemental oxygen: Secondary | ICD-10-CM

## 2021-05-05 DIAGNOSIS — D62 Acute posthemorrhagic anemia: Secondary | ICD-10-CM | POA: Diagnosis present

## 2021-05-05 DIAGNOSIS — I4821 Permanent atrial fibrillation: Secondary | ICD-10-CM | POA: Diagnosis present

## 2021-05-05 DIAGNOSIS — I5021 Acute systolic (congestive) heart failure: Secondary | ICD-10-CM | POA: Diagnosis not present

## 2021-05-05 DIAGNOSIS — E785 Hyperlipidemia, unspecified: Secondary | ICD-10-CM | POA: Diagnosis present

## 2021-05-05 DIAGNOSIS — I509 Heart failure, unspecified: Secondary | ICD-10-CM | POA: Diagnosis not present

## 2021-05-05 DIAGNOSIS — N179 Acute kidney failure, unspecified: Secondary | ICD-10-CM | POA: Diagnosis not present

## 2021-05-05 DIAGNOSIS — J90 Pleural effusion, not elsewhere classified: Secondary | ICD-10-CM | POA: Diagnosis not present

## 2021-05-05 DIAGNOSIS — I959 Hypotension, unspecified: Secondary | ICD-10-CM | POA: Diagnosis not present

## 2021-05-05 DIAGNOSIS — R231 Pallor: Secondary | ICD-10-CM | POA: Diagnosis not present

## 2021-05-05 DIAGNOSIS — E1142 Type 2 diabetes mellitus with diabetic polyneuropathy: Secondary | ICD-10-CM | POA: Diagnosis present

## 2021-05-05 DIAGNOSIS — I890 Lymphedema, not elsewhere classified: Secondary | ICD-10-CM | POA: Diagnosis present

## 2021-05-05 DIAGNOSIS — Z20822 Contact with and (suspected) exposure to covid-19: Secondary | ICD-10-CM | POA: Diagnosis not present

## 2021-05-05 DIAGNOSIS — R0902 Hypoxemia: Secondary | ICD-10-CM | POA: Diagnosis not present

## 2021-05-05 DIAGNOSIS — I429 Cardiomyopathy, unspecified: Secondary | ICD-10-CM | POA: Diagnosis present

## 2021-05-05 DIAGNOSIS — A419 Sepsis, unspecified organism: Secondary | ICD-10-CM | POA: Diagnosis not present

## 2021-05-05 DIAGNOSIS — R42 Dizziness and giddiness: Secondary | ICD-10-CM | POA: Diagnosis not present

## 2021-05-05 DIAGNOSIS — Z8719 Personal history of other diseases of the digestive system: Secondary | ICD-10-CM | POA: Diagnosis not present

## 2021-05-05 DIAGNOSIS — J439 Emphysema, unspecified: Secondary | ICD-10-CM | POA: Diagnosis present

## 2021-05-05 DIAGNOSIS — I517 Cardiomegaly: Secondary | ICD-10-CM | POA: Diagnosis not present

## 2021-05-05 DIAGNOSIS — Z95810 Presence of automatic (implantable) cardiac defibrillator: Secondary | ICD-10-CM

## 2021-05-05 DIAGNOSIS — N200 Calculus of kidney: Secondary | ICD-10-CM | POA: Diagnosis not present

## 2021-05-05 DIAGNOSIS — N182 Chronic kidney disease, stage 2 (mild): Secondary | ICD-10-CM | POA: Diagnosis present

## 2021-05-05 DIAGNOSIS — K5732 Diverticulitis of large intestine without perforation or abscess without bleeding: Secondary | ICD-10-CM | POA: Diagnosis not present

## 2021-05-05 DIAGNOSIS — Z825 Family history of asthma and other chronic lower respiratory diseases: Secondary | ICD-10-CM

## 2021-05-05 DIAGNOSIS — K5730 Diverticulosis of large intestine without perforation or abscess without bleeding: Secondary | ICD-10-CM | POA: Diagnosis not present

## 2021-05-05 DIAGNOSIS — I482 Chronic atrial fibrillation, unspecified: Secondary | ICD-10-CM | POA: Diagnosis not present

## 2021-05-05 DIAGNOSIS — R112 Nausea with vomiting, unspecified: Secondary | ICD-10-CM | POA: Diagnosis not present

## 2021-05-05 DIAGNOSIS — R571 Hypovolemic shock: Secondary | ICD-10-CM | POA: Diagnosis not present

## 2021-05-05 DIAGNOSIS — I2510 Atherosclerotic heart disease of native coronary artery without angina pectoris: Secondary | ICD-10-CM | POA: Diagnosis present

## 2021-05-05 DIAGNOSIS — R739 Hyperglycemia, unspecified: Secondary | ICD-10-CM | POA: Diagnosis not present

## 2021-05-05 DIAGNOSIS — Z888 Allergy status to other drugs, medicaments and biological substances status: Secondary | ICD-10-CM

## 2021-05-05 DIAGNOSIS — Z87891 Personal history of nicotine dependence: Secondary | ICD-10-CM

## 2021-05-05 DIAGNOSIS — I700 Atherosclerosis of aorta: Secondary | ICD-10-CM | POA: Diagnosis present

## 2021-05-05 DIAGNOSIS — M797 Fibromyalgia: Secondary | ICD-10-CM | POA: Diagnosis present

## 2021-05-05 DIAGNOSIS — M1712 Unilateral primary osteoarthritis, left knee: Secondary | ICD-10-CM | POA: Diagnosis present

## 2021-05-05 LAB — COMPREHENSIVE METABOLIC PANEL
ALT: 15 U/L (ref 0–44)
AST: 17 U/L (ref 15–41)
Albumin: 2.6 g/dL — ABNORMAL LOW (ref 3.5–5.0)
Alkaline Phosphatase: 37 U/L — ABNORMAL LOW (ref 38–126)
Anion gap: 7 (ref 5–15)
BUN: 21 mg/dL (ref 8–23)
CO2: 21 mmol/L — ABNORMAL LOW (ref 22–32)
Calcium: 8.2 mg/dL — ABNORMAL LOW (ref 8.9–10.3)
Chloride: 108 mmol/L (ref 98–111)
Creatinine, Ser: 1.45 mg/dL — ABNORMAL HIGH (ref 0.61–1.24)
GFR, Estimated: 53 mL/min — ABNORMAL LOW (ref >60)
Glucose, Bld: 212 mg/dL — ABNORMAL HIGH (ref 70–99)
Potassium: 3.9 mmol/L (ref 3.5–5.1)
Sodium: 136 mmol/L (ref 135–145)
Total Bilirubin: 0.6 mg/dL (ref 0.3–1.2)
Total Protein: 4.4 g/dL — ABNORMAL LOW (ref 6.5–8.1)

## 2021-05-05 LAB — CBC WITH DIFFERENTIAL/PLATELET
Abs Immature Granulocytes: 0.2 10*3/uL — ABNORMAL HIGH (ref 0.00–0.07)
Basophils Absolute: 0 10*3/uL (ref 0.0–0.1)
Basophils Relative: 0 %
Eosinophils Absolute: 0 10*3/uL (ref 0.0–0.5)
Eosinophils Relative: 0 %
HCT: 17.4 % — ABNORMAL LOW (ref 39.0–52.0)
Hemoglobin: 5.6 g/dL — CL (ref 13.0–17.0)
Immature Granulocytes: 1 %
Lymphocytes Relative: 11 %
Lymphs Abs: 1.5 10*3/uL (ref 0.7–4.0)
MCH: 33.9 pg (ref 26.0–34.0)
MCHC: 32.2 g/dL (ref 30.0–36.0)
MCV: 105.5 fL — ABNORMAL HIGH (ref 80.0–100.0)
Monocytes Absolute: 1.2 10*3/uL — ABNORMAL HIGH (ref 0.1–1.0)
Monocytes Relative: 8 %
Neutro Abs: 11.5 10*3/uL — ABNORMAL HIGH (ref 1.7–7.7)
Neutrophils Relative %: 80 %
Platelets: 234 10*3/uL (ref 150–400)
RBC: 1.65 MIL/uL — ABNORMAL LOW (ref 4.22–5.81)
RDW: 12.6 % (ref 11.5–15.5)
WBC: 14.5 10*3/uL — ABNORMAL HIGH (ref 4.0–10.5)
nRBC: 0.1 % (ref 0.0–0.2)

## 2021-05-05 LAB — MAGNESIUM: Magnesium: 1.9 mg/dL (ref 1.7–2.4)

## 2021-05-05 LAB — LIPASE, BLOOD: Lipase: 28 U/L (ref 11–51)

## 2021-05-05 LAB — LACTIC ACID, PLASMA
Lactic Acid, Venous: 3.9 mmol/L (ref 0.5–1.9)
Lactic Acid, Venous: 3 mmol/L (ref 0.5–1.9)

## 2021-05-05 LAB — CBC
HCT: 23.9 % — ABNORMAL LOW (ref 39.0–52.0)
Hemoglobin: 7.9 g/dL — ABNORMAL LOW (ref 13.0–17.0)
MCH: 32.2 pg (ref 26.0–34.0)
MCHC: 33.1 g/dL (ref 30.0–36.0)
MCV: 97.6 fL (ref 80.0–100.0)
Platelets: 240 10*3/uL (ref 150–400)
RBC: 2.45 MIL/uL — ABNORMAL LOW (ref 4.22–5.81)
RDW: 15.7 % — ABNORMAL HIGH (ref 11.5–15.5)
WBC: 31.3 10*3/uL — ABNORMAL HIGH (ref 4.0–10.5)
nRBC: 0.3 % — ABNORMAL HIGH (ref 0.0–0.2)

## 2021-05-05 LAB — MRSA NEXT GEN BY PCR, NASAL: MRSA by PCR Next Gen: NOT DETECTED

## 2021-05-05 LAB — GLUCOSE, CAPILLARY
Glucose-Capillary: 116 mg/dL — ABNORMAL HIGH (ref 70–99)
Glucose-Capillary: 128 mg/dL — ABNORMAL HIGH (ref 70–99)
Glucose-Capillary: 130 mg/dL — ABNORMAL HIGH (ref 70–99)
Glucose-Capillary: 136 mg/dL — ABNORMAL HIGH (ref 70–99)

## 2021-05-05 LAB — RESP PANEL BY RT-PCR (FLU A&B, COVID) ARPGX2
Influenza A by PCR: NEGATIVE
Influenza B by PCR: NEGATIVE
SARS Coronavirus 2 by RT PCR: NEGATIVE

## 2021-05-05 LAB — PROTIME-INR
INR: 8.4 (ref 0.8–1.2)
Prothrombin Time: 69.4 seconds — ABNORMAL HIGH (ref 11.4–15.2)

## 2021-05-05 LAB — TROPONIN I (HIGH SENSITIVITY)
Troponin I (High Sensitivity): 13 ng/L (ref <18)
Troponin I (High Sensitivity): 16 ng/L (ref <18)

## 2021-05-05 LAB — BRAIN NATRIURETIC PEPTIDE: B Natriuretic Peptide: 25 pg/mL (ref 0.0–100.0)

## 2021-05-05 LAB — HIV ANTIBODY (ROUTINE TESTING W REFLEX): HIV Screen 4th Generation wRfx: NONREACTIVE

## 2021-05-05 LAB — PREPARE RBC (CROSSMATCH)

## 2021-05-05 LAB — POC OCCULT BLOOD, ED: Fecal Occult Bld: POSITIVE — AB

## 2021-05-05 MED ORDER — VITAMIN K1 10 MG/ML IJ SOLN
10.0000 mg | Freq: Once | INTRAVENOUS | Status: AC
  Administered 2021-05-05: 10 mg via INTRAVENOUS
  Filled 2021-05-05 (×2): qty 1

## 2021-05-05 MED ORDER — ALBUTEROL SULFATE (2.5 MG/3ML) 0.083% IN NEBU
2.5000 mg | INHALATION_SOLUTION | RESPIRATORY_TRACT | Status: DC | PRN
  Administered 2021-05-09: 2.5 mg via RESPIRATORY_TRACT
  Filled 2021-05-05: qty 3

## 2021-05-05 MED ORDER — METRONIDAZOLE 500 MG/100ML IV SOLN
500.0000 mg | Freq: Two times a day (BID) | INTRAVENOUS | Status: DC
  Administered 2021-05-05 – 2021-05-09 (×8): 500 mg via INTRAVENOUS
  Filled 2021-05-05 (×5): qty 100

## 2021-05-05 MED ORDER — INSULIN ASPART 100 UNIT/ML IJ SOLN
0.0000 [IU] | INTRAMUSCULAR | Status: DC
Start: 2021-05-05 — End: 2021-05-07
  Administered 2021-05-05 – 2021-05-06 (×2): 2 [IU] via SUBCUTANEOUS
  Administered 2021-05-06: 3 [IU] via SUBCUTANEOUS
  Administered 2021-05-06 (×2): 2 [IU] via SUBCUTANEOUS

## 2021-05-05 MED ORDER — IPRATROPIUM-ALBUTEROL 0.5-2.5 (3) MG/3ML IN SOLN
3.0000 mL | RESPIRATORY_TRACT | Status: DC
  Administered 2021-05-05 (×2): 3 mL via RESPIRATORY_TRACT
  Filled 2021-05-05 (×2): qty 3

## 2021-05-05 MED ORDER — POLYETHYLENE GLYCOL 3350 17 G PO PACK: 17.0000 g | PACK | Freq: Every day | ORAL | Status: DC | PRN

## 2021-05-05 MED ORDER — SODIUM CHLORIDE 0.9 % IV BOLUS
1000.0000 mL | Freq: Once | INTRAVENOUS | Status: AC
  Administered 2021-05-05: 1000 mL via INTRAVENOUS

## 2021-05-05 MED ORDER — IOHEXOL 350 MG/ML SOLN
100.0000 mL | Freq: Once | INTRAVENOUS | Status: AC | PRN
  Administered 2021-05-05: 100 mL via INTRAVENOUS

## 2021-05-05 MED ORDER — TRAZODONE HCL 50 MG PO TABS
50.0000 mg | ORAL_TABLET | Freq: Once | ORAL | Status: AC
  Administered 2021-05-05: 50 mg via ORAL
  Filled 2021-05-05: qty 1

## 2021-05-05 MED ORDER — NOREPINEPHRINE 4 MG/250ML-% IV SOLN
2.0000 ug/min | INTRAVENOUS | Status: DC
Start: 2021-05-05 — End: 2021-05-07
  Administered 2021-05-05: 2 ug/min via INTRAVENOUS
  Administered 2021-05-05: 10 ug/min via INTRAVENOUS
  Administered 2021-05-06: 8 ug/min via INTRAVENOUS
  Administered 2021-05-06: 6 ug/min via INTRAVENOUS
  Filled 2021-05-05 (×4): qty 250

## 2021-05-05 MED ORDER — LACTATED RINGERS IV BOLUS
500.0000 mL | Freq: Once | INTRAVENOUS | Status: AC
  Administered 2021-05-05: 500 mL via INTRAVENOUS

## 2021-05-05 MED ORDER — SODIUM CHLORIDE 0.9 % IV SOLN
250.0000 mL | INTRAVENOUS | Status: DC
  Administered 2021-05-05 – 2021-05-07 (×3): 250 mL via INTRAVENOUS

## 2021-05-05 MED ORDER — SODIUM CHLORIDE 0.9 % IV SOLN
2.0000 g | INTRAVENOUS | Status: DC
  Administered 2021-05-05 – 2021-05-08 (×4): 2 g via INTRAVENOUS
  Filled 2021-05-05 (×4): qty 20

## 2021-05-05 MED ORDER — PANTOPRAZOLE SODIUM 40 MG IV SOLR
40.0000 mg | Freq: Once | INTRAVENOUS | Status: AC
  Administered 2021-05-05: 40 mg via INTRAVENOUS
  Filled 2021-05-05: qty 40

## 2021-05-05 MED ORDER — SODIUM CHLORIDE 0.9 % IV SOLN: 10.0000 mL/h | Freq: Once | INTRAVENOUS | Status: DC

## 2021-05-05 MED ORDER — PANTOPRAZOLE SODIUM 40 MG IV SOLR
40.0000 mg | Freq: Two times a day (BID) | INTRAVENOUS | Status: DC
  Administered 2021-05-05 – 2021-05-10 (×10): 40 mg via INTRAVENOUS
  Filled 2021-05-05 (×10): qty 40

## 2021-05-05 MED ORDER — ACETAMINOPHEN 325 MG PO TABS
650.0000 mg | ORAL_TABLET | Freq: Four times a day (QID) | ORAL | Status: DC | PRN
  Administered 2021-05-05: 650 mg via ORAL
  Filled 2021-05-05: qty 2

## 2021-05-05 MED ORDER — METRONIDAZOLE 500 MG/100ML IV SOLN
500.0000 mg | Freq: Three times a day (TID) | INTRAVENOUS | Status: DC
  Filled 2021-05-05: qty 100

## 2021-05-05 MED ORDER — PANTOPRAZOLE INFUSION (NEW) - SIMPLE MED
8.0000 mg/h | INTRAVENOUS | Status: DC
  Administered 2021-05-05: 8 mg/h via INTRAVENOUS
  Filled 2021-05-05: qty 80

## 2021-05-05 MED ORDER — SODIUM CHLORIDE 0.9 % IV BOLUS
500.0000 mL | Freq: Once | INTRAVENOUS | Status: AC
  Administered 2021-05-05: 500 mL via INTRAVENOUS

## 2021-05-05 MED ORDER — DOCUSATE SODIUM 100 MG PO CAPS: 100.0000 mg | ORAL_CAPSULE | Freq: Two times a day (BID) | ORAL | Status: DC | PRN

## 2021-05-05 MED ORDER — ACETAMINOPHEN 325 MG PO TABS
650.0000 mg | ORAL_TABLET | ORAL | Status: DC | PRN
  Administered 2021-05-05 – 2021-05-10 (×13): 650 mg via ORAL
  Filled 2021-05-05 (×13): qty 2

## 2021-05-05 MED ORDER — ONDANSETRON HCL 4 MG/2ML IJ SOLN: 4.0000 mg | Freq: Four times a day (QID) | INTRAMUSCULAR | Status: DC | PRN

## 2021-05-05 NOTE — Progress Notes (Signed)
eLink Physician-Brief Progress Note Patient Name: [name] DOB: Feb 14, 1956 MRN: [id number]   Date of Service  05/05/2021  HPI/Events of Note  Multiple issues: 1. Patient c/o chronic back pain and 2. Patient request for home Trazodone.   eICU Interventions  Plan: Increase Tylenol to 650 mg PO Q  4 hours PRN pain. Trazodone 50 mg PO at 10 PM tonight.      Intervention Category Major Interventions: Other:  Lysle Dingwall 05/05/2021, 8:28 PM

## 2021-05-05 NOTE — Plan of Care (Signed)
  Problem: Fluid Volume: Goal: Will show no signs and symptoms of excessive bleeding Outcome: Progressing   Problem: Clinical Measurements: Goal: Complications related to the disease process, condition or treatment will be avoided or minimized Outcome: Progressing   Problem: Education: Goal: Ability to identify signs and symptoms of gastrointestinal bleeding will improve Outcome: Progressing
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
## 2021-05-05 NOTE — H&P (Signed)
NAME:  [name], MRN:  [id number], DOB:  [birth year], LOS: 0 ADMISSION DATE:  05/05/2021, CONSULTATION DATE:  05/05/21 REFERRING MD:  [place] - ED, CHIEF COMPLAINT:  weakness, dizziness   History of Present Illness:  65 year old man whom we are seeing for evaluation of hypovolemic shock in the setting of GI bleed.  ED notes reviewed.  Patient is about 1 week history of left lower quadrant pain.  Intermittent.  Reports noted blood in the stool the last couple of days.  Intermittent and progressive lightheadedness, dizziness especially when standing from seated position.  Progressive weakness.  Called EMS this morning.  On arrival reportedly systolics in the 0000000.  Given fluids in route without significant improvement.  Additional 1.5 L given over time in the ED.  Intermittent increase in blood pressure although with decreased back down systolics to the [id number].  Hemoglobin in the fives from baseline 14 2 months ago.  Patient is on warfarin for A. fib.  No INR checked.  No vitamin K given.  No blood given at time of evaluation.  On exam, patient is sitting up and conversant.  Blood pressure noted to be progressively lower during time of evaluation.  GI consult pending.  Pertinent  Medical History  CHF EF 40% 12/2020, A. fib status post pacemaker on warfarin, PAD, chronic right lower extremity wound  Significant Hospital Events: Including procedures, antibiotic start and stop dates in addition to other pertinent events   7/22 brought to ED, hypotensive, intermittently better with fluids, hematochezia, admitted to [facility] for hypotension in the setting of GI bleed and shock  Interim History / Subjective:  N/A  Objective   Blood pressure (!) 85/38, pulse 70, temperature (!) 97.5 F (36.4 C), temperature source Tympanic, resp. rate (!) 23, SpO2 100 %.       No intake or output data in the 24 hours ending 05/05/21 1138 There were no vitals filed for this visit.  Examination: General: Sitting in bed, no  acute distress Lungs: Clear, normal work of breathing, on room air, sats 100% Cardiovascular: Regular rate and rhythm, no murmur Abdomen: Nondistended, mild left lower quadrant pain Extremities: No edema, wound right lateral lower leg Neuro: No weakness, sensation intact   Resolved Hospital Problem list   N/a  Assessment & Plan:  Hypovolemic shock due to Hematochezia: Hypotensive, lactate elevated. Intermittent improvement with IVF. Remains hypotensive. No blood given yet. LLQ abdominal pain, possible diverticulitis. Endorses NSAID use, brisk ulcer bleed possible. On warfarin for Afib exacerbating bleeding.  -- CT abdomen angio performed with read pending --2u pRBC on way at time of eval --q6 hgbs --IV PPI BID --Vit K 10 mg IV now, INR pending  --500 cc LR now --NE titrate MAP > 65 --GI cs pending  CHF with reduced EF: 40% 12/2020. Improved over time. On EBM for HF, possibly improved with medicines in interim. On RA, 100%, breathing comfortably. Does not appear volume up. BNP WNL. --Addl fluid boluses as tolerated --Hold entresto, BB, torsemide  COPD: fixed obstruction PFT 2015 that resolved with bronchodilators (although no significant BD response per criteria). --duonebs PRN  Afib: s/p pacemaker, defib for CHF --hold digoxin --Hold warfarin (bleed)  DM: --SSI  Best Practice (right click and "Reselect all SmartList Selections" daily)   Diet/type: NPO w/ oral meds DVT prophylaxis: SCD - GI bleed holding chemoprophylaxis GI prophylaxis: PPI Lines: N/A Foley:  N/A Code Status:  full code Last date of multidisciplinary goals of care discussion [n/a]  Labs  CBC: Recent Labs  Lab 05/05/21 0801  WBC 14.5*  NEUTROABS 11.5*  HGB 5.6*  HCT 17.4*  MCV 105.5*  PLT [id number]    Basic Metabolic Panel: Recent Labs  Lab 05/05/21 0801  NA 136  K 3.9  CL 108  CO2 21*  GLUCOSE 212*  BUN 21  CREATININE 1.45*  CALCIUM 8.2*  MG 1.9   GFR: Estimated Creatinine Clearance:  63.7 mL/min (A) (by C-G formula based on SCr of 1.45 mg/dL (H)). Recent Labs  Lab 05/05/21 0801 05/05/21 1000  WBC 14.5*  --   LATICACIDVEN 3.9* 3.0*    Liver Function Tests: Recent Labs  Lab 05/05/21 0801  AST 17  ALT 15  ALKPHOS 37*  BILITOT 0.6  PROT 4.4*  ALBUMIN 2.6*   Recent Labs  Lab 05/05/21 0801  LIPASE 28   No results for input(s): AMMONIA in the last 168 hours.  ABG    Component Value Date/Time   HCO3 27.9 01/28/2017 1222   HCO3 29.1 (H) 01/28/2017 1222   TCO2 29 01/28/2017 1222   TCO2 31 01/28/2017 1222   ACIDBASEDEF 1.0 03/21/2016 1101   O2SAT 70.0 01/28/2017 1222   O2SAT 71.0 01/28/2017 1222     Coagulation Profile: No results for input(s): INR, PROTIME in the last 168 hours.  Cardiac Enzymes: No results for input(s): CKTOTAL, CKMB, CKMBINDEX, TROPONINI in the last 168 hours.  HbA1C: Hgb A1c MFr Bld  Date/Time Value Ref Range Status  02/28/2021 03:43 PM 5.5 4.6 - 6.5 % Final    Comment:    Glycemic Control Guidelines for People with Diabetes:Non Diabetic:  <6%Goal of Therapy: <7%Additional Action Suggested:  >8%   09/16/2020 03:53 PM 5.6 4.6 - 6.5 % Final    Comment:    Glycemic Control Guidelines for People with Diabetes:Non Diabetic:  <6%Goal of Therapy: <7%Additional Action Suggested:  >8%     CBG: No results for input(s): GLUCAP in the last 168 hours.  Review of Systems:   Mild chest pain getting in to ambulance. No orthopnea or PND. No LE swelling.  Comprehensive review of systems otherwise negative.  Past Medical History:  He,  has a past medical history of AICD (automatic cardioverter/defibrillator) present, Anemia, ANXIETY, Arthritis, Asthma, Atrial fibrillation ([place]) (09/06/2015), Cardiomyopathy ([place]), CHF (congestive heart failure) ([place]), Chronic constipation, COPD (chronic obstructive pulmonary disease) ([place]), DEPRESSION, Diverticulitis, DYSKINESIA, ESOPHAGUS, Emphysema of lung ([place]), Essential hypertension, FIBROMYALGIA, GERD  (gastroesophageal reflux disease), Glaucoma, HYPERLIPIDEMIA, INSOMNIA, Myocardial infarction [facility]), Myocardial infarction ([place]), O2 dependent, Paroxysmal atrial fibrillation ([place]), Peripheral neuropathy, Pneumonia (12/2016), Rash and other nonspecific skin eruption (04/12/2009), SLEEP APNEA, OBSTRUCTIVE, Syncope, and Type II diabetes mellitus ([place]).   Surgical History:   Past Surgical History:  Procedure Laterality Date   ACNE CYST REMOVAL     2 on back    AV NODE ABLATION N/A 10/25/2017   Procedure: AV NODE ABLATION;  Surgeon: Deboraha Sprang, MD;  Location: [place] [facility];  Service: Cardiovascular;  Laterality: N/A;   BIV ICD INSERTION CRT-D N/A 09/18/2017   Procedure: BIV ICD INSERTION CRT-D;  Surgeon: Deboraha Sprang, MD;  Location: [place] [facility];  Service: Cardiovascular;  Laterality: N/A;   CARDIAC CATHETERIZATION N/A 03/21/2016   Procedure: Right/Left Heart Cath and Coronary Angiography;  Surgeon: Larey Dresser, MD;  Location: [place] [facility];  Service: Cardiovascular;  Laterality: N/A;   CARDIOVERSION  04/18/2012   Procedure: CARDIOVERSION;  Surgeon: Fay Records, MD;  Location: [place];  Service: Cardiovascular;  Laterality: N/A;   CARDIOVERSION  04/25/2012   Procedure: CARDIOVERSION;  Surgeon: Thayer Headings, MD;  Location: [place];  Service: Cardiovascular;  Laterality: N/A;   CARDIOVERSION  04/25/2012   Procedure: CARDIOVERSION;  Surgeon: Fay Records, MD;  Location: [place];  Service: Cardiovascular;  Laterality: N/A;   CARDIOVERSION  05/09/2012   Procedure: CARDIOVERSION;  Surgeon: Sherren Mocha, MD;  Location: [place];  Service: Cardiovascular;  Laterality: N/A;  changed from crenshaw to cooper by trish/leone-endo   CARDIOVERSION N/A 03/15/2015   Procedure: CARDIOVERSION;  Surgeon: Thayer Headings, MD;  Location: [place];  Service: Cardiovascular;  Laterality: N/A;   COLONOSCOPY     COLONOSCOPY WITH PROPOFOL N/A 10/21/2014   Procedure: COLONOSCOPY WITH PROPOFOL;   Surgeon: Ladene Artist, MD;  Location: [facility];  Service: Endoscopy;  Laterality: N/A;   EP IMPLANTABLE DEVICE N/A 04/06/2015   Procedure: Loop Recorder Insertion;  Surgeon: Evans Lance, MD;  Location: [place] [facility];  Service: Cardiovascular;  Laterality: N/A;   ESOPHAGOGASTRODUODENOSCOPY     JOINT REPLACEMENT     LOOP RECORDER REMOVAL N/A 09/18/2017   Procedure: LOOP RECORDER REMOVAL;  Surgeon: Deboraha Sprang, MD;  Location: [place] [facility];  Service: Cardiovascular;  Laterality: N/A;   RIGHT/LEFT HEART CATH AND CORONARY ANGIOGRAPHY N/A 01/28/2017   Procedure: Right/Left Heart Cath and Coronary Angiography;  Surgeon: Larey Dresser, MD;  Location: [place] [facility];  Service: Cardiovascular;  Laterality: N/A;   TEE WITHOUT CARDIOVERSION  04/25/2012   Procedure: TRANSESOPHAGEAL ECHOCARDIOGRAM (TEE);  Surgeon: Thayer Headings, MD;  Location: [place];  Service: Cardiovascular;  Laterality: N/A;   TEE WITHOUT CARDIOVERSION N/A 03/15/2015   Procedure: TRANSESOPHAGEAL ECHOCARDIOGRAM (TEE);  Surgeon: Thayer Headings, MD;  Location: [place];  Service: Cardiovascular;  Laterality: N/A;   TONSILLECTOMY AND ADENOIDECTOMY     TOTAL KNEE ARTHROPLASTY Right 06/15/2014   Procedure: TOTAL KNEE ARTHROPLASTY;  Surgeon: Renette Butters, MD;  Location: [place];  Service: Orthopedics;  Laterality: Right;     Social History:   reports that he quit smoking about 13 years ago. His smoking use included cigarettes. He has a 60.00 pack-year smoking history. He has never used smokeless tobacco. He reports that he does not drink alcohol and does not use drugs.   Family History:  His family history includes Allergies in his mother; Asthma in his maternal grandmother and mother; COPD in his mother; Colon polyps in his mother; Hypothyroidism in his mother. There is no history of Colon cancer.   Allergies Allergies  Allergen Reactions   Amiodarone Other (See Comments)    hyperthyroidism   Statins  Other (See Comments)    myalgia   Tape Other (See Comments)    Skin Tears Use Paper Tape Only     Home Medications  Prior to Admission medications   Medication Sig Start Date End Date Taking? Authorizing Provider  acetaminophen (TYLENOL) 500 MG tablet Take 500 mg by mouth every 6 (six) hours as needed.    [provider]  albuterol (VENTOLIN HFA) 108 (90 Base) MCG/ACT inhaler INHALE 2 PUFFS 4 TIMES A DAY as needed 11/27/19   [name], MD  ALPRAZolam Duanne Moron) 0.5 MG tablet TAKE 2 TABLETS BY MOUTH AT BEDTIME 04/13/21   [name], MD  AMBULATORY NON FORMULARY MEDICATION Rolling walker with a bench 01/17/21   Lyndal Pulley, DO  augmented betamethasone dipropionate (DIPROLENE-AF) 0.05 % cream Apply topically. 10/11/20   [provider]  carisoprodol (SOMA) 350 MG tablet TAKE 1 TABLET BY MOUTH THREE TIMES A DAY AS NEEDED FOR MUSCLE SPASMS 02/05/21   [name], MD  carvedilol (COREG) 12.5 MG tablet Take 1 tablet (12.5 mg total) by mouth 2 (two) times daily with a meal. 06/30/20   Larey Dresser, MD  Cholecalciferol 100 MCG (4000 UT) CAPS 1 tab by mouth once daily 03/18/21   [name], MD  dapagliflozin propanediol (FARXIGA) 10 MG TABS tablet Take 1 tablet (10 mg total) by mouth daily before breakfast. 12/20/20   Larey Dresser, MD  digoxin (LANOXIN) 0.25 MG tablet TAKE 1/2 TABLET BY MOUTH EVERY DAY 10/03/20   Larey Dresser, MD  doxepin (SINEQUAN) 10 MG capsule Take 2 capsules (20 mg total) by mouth at bedtime as needed. 12/13/20   [name], MD  Dupilumab (DUPIXENT) 300 MG/2ML SOPN Inject 1 Dose into the skin every 14 (fourteen) days.    [provider]  Evolocumab (REPATHA SURECLICK) [id number] MG/ML SOAJ Inject 1 pen into the skin every 14 (fourteen) days. 10/26/20   Larey Dresser, MD  fluticasone (FLONASE) 50 MCG/ACT nasal spray PLACE 2 SPRAYS INTO BOTH NOSTRILS DAILY AS NEEDED FOR ALLERGIES OR RHINITIS. 02/01/21   [name], MD  hydrOXYzine  (ATARAX/VISTARIL) 10 MG tablet TAKE 1 TABLET BY MOUTH 3 TIMES DAILY AS NEEDED FOR ITCHING. 04/25/21   [name], MD  lidocaine (LIDODERM) 5 % APPLY 1 PATCH TO AFFECTED AREA FOR UP TO 12 HOURS . DO NOT USE MORE THAN ONE PATCH IN 24 HOURS. 02/01/21   [name], MD  omeprazole (PRILOSEC) 20 MG capsule TAKE 1 CAPSULE BY MOUTH TWICE A DAY 02/03/21   [name], MD  potassium chloride SA (KLOR-CON) 20 MEQ tablet Take 1 tablet (20 mEq total) by mouth daily. 11/07/20   Larey Dresser, MD  sacubitril-valsartan (ENTRESTO) 49-51 MG Take 0.5 tablets by mouth 2 (two) times daily. 02/28/21   Larey Dresser, MD  spironolactone (ALDACTONE) 25 MG tablet TAKE 1 TABLET BY MOUTH EVERY DAY 01/30/21   Larey Dresser, MD  tamsulosin [facility]) 0.4 MG CAPS capsule  03/11/20   [provider]  torsemide (DEMADEX) 20 MG tablet TAKE 4 TABLETS (80 MG TOTAL) BY MOUTH 2 (TWO) TIMES DAILY. 03/28/21   Larey Dresser, MD  traMADol Veatrice Bourbon) 50 MG tablet TAKE 1 TABLET BY MOUTH DAILY AS NEEDED. 03/20/21   [name], MD  traZODone (DESYREL) 100 MG tablet TAKE 1 TABLET BY MOUTH EVERY DAY AT BEDTIME AS NEEDED 09/16/20   [name], MD  TUMS 500 MG chewable tablet Chew 500-2,000 mg by mouth every 4 (four) hours as needed for indigestion or heartburn.  10/19/16   [provider]  valACYclovir (VALTREX) 1000 MG tablet TAKE 1 TABLET BY MOUTH THREE TIMES A DAY 05/02/20   [name], MD  venlafaxine XR (EFFEXOR-XR) 75 MG 24 hr capsule TAKE 1 CAPSULE BY MOUTH DAILY WITH BREAKFAST. 03/03/21   Lyndal Pulley, DO  warfarin (COUMADIN) 10 MG tablet Take 10 mg by mouth daily. Mon,wed,fri,sat    [provider]  zolpidem (AMBIEN) 10 MG tablet TAKE 1 TABLET BY MOUTH AT BEDTIME AS NEEDED 03/03/21   [name], MD     Critical care time:      CRITICAL CARE Performed by: [name] Clam   Total critical care time: 40 minutes  Critical care time was exclusive of  separately billable procedures and  treating other patients.  Critical care was necessary to treat or prevent imminent or life-threatening deterioration.  Critical care was time spent personally by me on the following activities: development of treatment plan with patient and/or surrogate as well as nursing, discussions with consultants, evaluation of patient's response to treatment, examination of patient, obtaining history from patient or surrogate, ordering and performing treatments and interventions, ordering and review of laboratory studies, ordering and review of radiographic studies, pulse oximetry and re-evaluation of patient's condition.

## 2021-05-05 NOTE — ED Notes (Signed)
Xray at the bedside.

## 2021-05-05 NOTE — ED Provider Notes (Signed)
[facility] EMERGENCY DEPARTMENT Provider Note   CSN: [id number] Arrival date & time: 05/05/21  0749     History Chief Complaint  Patient presents with   Diarrhea   Hypotension    [name] is a 65 y.o. male.  Patient presents chief complaint of severe fatigue, vomiting and diarrhea.  He states symptoms of been ongoing for the past 3 days.  He stated he had some left lower quadrant abdominal pain which is since improved.  Describes the vomitus and diarrhea is nonbloody.  Denies fevers or cough.  Complaining of generalized body aches as well.      Past Medical History:  Diagnosis Date   AICD (automatic cardioverter/defibrillator) present    Anemia    supposed to be taking Vit B but doesn't   ANXIETY    takes Xanax nightly   Arthritis    Asthma    Albuterol prn and Advair daily;also takes Prednisone daily   Atrial fibrillation ([place]) 09/06/2015   Cardiomyopathy ([place])    a. EF 25% TEE July 2013; b. EF normalized 2015;  c. 03/2015 Echo: EF 40-45%, difrf HK, PASP 38 mmHg, Mild MR, sev LAE/RAE.   CHF (congestive heart failure) (HCC)    Chronic constipation    takes OTC stool softener   COPD (chronic obstructive pulmonary disease) (HCC)    "one dr says COPD; one dr says emphysema" (09/18/2017)   DEPRESSION    takes Zoloft and Doxepin daily   Diverticulitis    DYSKINESIA, ESOPHAGUS    Emphysema of lung ([place])    "one dr says COPD; one dr says emphysema" (09/18/2017)   Essential hypertension        FIBROMYALGIA    GERD (gastroesophageal reflux disease)        Glaucoma    HYPERLIPIDEMIA    a. Intolerant to statins.   INSOMNIA    takes Ambien nightly   Myocardial infarction [facility])    a. 2012 Myoview notable for prior infarct;  b. 03/2015 Lexiscan CL: EF 37%, diff HK, small area of inferior infarct from apex to base-->Med Rx.   Myocardial infarction ([place])    O2 dependent    "2.5L q hs & prn" (09/18/2017)   Paroxysmal atrial fibrillation ([place])    a.  CHA2DS2VASc = 3--> takes Coumadin;  b. 03/15/2015 Successful TEE/DCCV;  c. 03/2015 recurrent afib, Amio d/c'd in setting of hyperthyroidism.   Peripheral neuropathy    Pneumonia 12/2016   Rash and other nonspecific skin eruption 04/12/2009   no cause found saw dermatologists x 2 and allergist   SLEEP APNEA, OBSTRUCTIVE    a. doesn't use CPAP   Syncope    a. 03/2015 s/p MDT LINQ.   Type II diabetes mellitus [facility])         Patient Active Problem List   Diagnosis Date Noted   Hemorrhagic shock ([place]) 05/05/2021   Preop exam for internal medicine 03/04/2021   B12 deficiency 03/04/2021   Vitamin D deficiency 03/01/2021   Myalgia 12/01/2020   Laceration of left leg 09/17/2020   Statin myopathy 08/02/2020   Rotator cuff arthropathy of both shoulders 02/23/2020   (HFpEF) heart failure with preserved ejection fraction ([place]) 02/22/2020   ICD (implantable cardioverter-defibrillator) in place 12/27/2019   COVID-19 virus infection 12/09/2019   COVID-19 12/09/2019   Hypotension 12/09/2019   Upper respiratory infection 11/27/2019   COPD exacerbation ([place]) 11/27/2019   Elevated PSA 09/23/2019   Lip lesion 12/23/2018   Diabetic ulcer of left lower  leg with necrosis of muscle ([place]) 12/05/2018   Chronic bursitis of left shoulder 07/01/2018   Pain due to total knee replacement ([place]) 03/31/2018   Rotator cuff arthropathy, left 02/13/2018   Right rotator cuff tear arthropathy 01/14/2018   Increased prostate specific antigen (PSA) velocity 09/26/2017   Trigger point of thoracic region 07/04/2017   Right lumbar radiculopathy 05/15/2017   Right leg swelling 05/09/2017   Glaucoma 04/18/2017   History of nasal polyposis 02/19/2017   Chronic rhinitis 02/19/2017   Microhematuria 12/04/2016   Encounter for well adult exam with abnormal findings 11/29/2015   Type II diabetes mellitus ([place])    Hyperlipidemia    Persistent atrial fibrillation ([place]) 03/30/2015   Syncope 03/30/2015   Anemia 02/23/2015    Thyrotoxicosis 02/23/2015   Hx of adenomatous colonic polyps 10/21/2014   De Quervain's tenosynovitis, right 04/30/2014   Osteoarthritis of right knee 02/19/2014   Encounter for therapeutic drug monitoring 11/17/2013   Thoracic degenerative disc disease 06/17/2013   Thoracic spondylosis without myelopathy 02/11/2013   Lumbosacral spondylosis without myelopathy 02/11/2013   Degenerative joint disease of knee, left 02/11/2013   COPD (chronic obstructive pulmonary disease) ([place]) 08/20/2012   Bundle branch block, right and extreme left anterior fascicular 05/05/2012   Nonischemic cardiomyopathy ([place]) 05/02/2012   Anticoagulated on warfarin 03/21/2012   Spongiotic dermatitis 09/26/2011   Past myocardial infarction 04/19/2011   UNSPECIFIED URETHRAL STRICTURE 12/26/2010   Chronic pain syndrome 05/18/2010   Obstructive sleep apnea 10/21/2007   NEPHROLITHIASIS, HX OF 10/21/2007   Anxiety 06/09/2007   GERD 06/09/2007   BENIGN PROSTATIC HYPERTROPHY 06/09/2007   Morbid obesity ([place]) 06/03/2007   Depression 06/03/2007   Essential hypertension 06/03/2007   INSOMNIA 06/03/2007    Past Surgical History:  Procedure Laterality Date   ACNE CYST REMOVAL     2 on back    AV NODE ABLATION N/A 10/25/2017   Procedure: AV NODE ABLATION;  Surgeon: Deboraha Sprang, MD;  Location: [place] [facility];  Service: Cardiovascular;  Laterality: N/A;   BIV ICD INSERTION CRT-D N/A 09/18/2017   Procedure: BIV ICD INSERTION CRT-D;  Surgeon: Deboraha Sprang, MD;  Location: [place] [facility];  Service: Cardiovascular;  Laterality: N/A;   CARDIAC CATHETERIZATION N/A 03/21/2016   Procedure: Right/Left Heart Cath and Coronary Angiography;  Surgeon: Larey Dresser, MD;  Location: [place] [facility];  Service: Cardiovascular;  Laterality: N/A;   CARDIOVERSION  04/18/2012   Procedure: CARDIOVERSION;  Surgeon: Fay Records, MD;  Location: [place];  Service: Cardiovascular;  Laterality: N/A;   CARDIOVERSION  04/25/2012    Procedure: CARDIOVERSION;  Surgeon: Thayer Headings, MD;  Location: [place];  Service: Cardiovascular;  Laterality: N/A;   CARDIOVERSION  04/25/2012   Procedure: CARDIOVERSION;  Surgeon: Fay Records, MD;  Location: [place];  Service: Cardiovascular;  Laterality: N/A;   CARDIOVERSION  05/09/2012   Procedure: CARDIOVERSION;  Surgeon: Sherren Mocha, MD;  Location: [place];  Service: Cardiovascular;  Laterality: N/A;  changed from crenshaw to cooper by trish/leone-endo   CARDIOVERSION N/A 03/15/2015   Procedure: CARDIOVERSION;  Surgeon: Thayer Headings, MD;  Location: [place];  Service: Cardiovascular;  Laterality: N/A;   COLONOSCOPY     COLONOSCOPY WITH PROPOFOL N/A 10/21/2014   Procedure: COLONOSCOPY WITH PROPOFOL;  Surgeon: Ladene Artist, MD;  Location: [facility];  Service: Endoscopy;  Laterality: N/A;   EP IMPLANTABLE DEVICE N/A 04/06/2015   Procedure: Loop Recorder Insertion;  Surgeon: Evans Lance, MD;  Location: [place] [facility];  Service: Cardiovascular;  Laterality: N/A;   ESOPHAGOGASTRODUODENOSCOPY     JOINT REPLACEMENT     LOOP RECORDER REMOVAL N/A 09/18/2017   Procedure: LOOP RECORDER REMOVAL;  Surgeon: Deboraha Sprang, MD;  Location: [place] [facility];  Service: Cardiovascular;  Laterality: N/A;   RIGHT/LEFT HEART CATH AND CORONARY ANGIOGRAPHY N/A 01/28/2017   Procedure: Right/Left Heart Cath and Coronary Angiography;  Surgeon: Larey Dresser, MD;  Location: [place] [facility];  Service: Cardiovascular;  Laterality: N/A;   TEE WITHOUT CARDIOVERSION  04/25/2012   Procedure: TRANSESOPHAGEAL ECHOCARDIOGRAM (TEE);  Surgeon: Thayer Headings, MD;  Location: [place];  Service: Cardiovascular;  Laterality: N/A;   TEE WITHOUT CARDIOVERSION N/A 03/15/2015   Procedure: TRANSESOPHAGEAL ECHOCARDIOGRAM (TEE);  Surgeon: Thayer Headings, MD;  Location: [place];  Service: Cardiovascular;  Laterality: N/A;   TONSILLECTOMY AND ADENOIDECTOMY     TOTAL KNEE ARTHROPLASTY Right 06/15/2014    Procedure: TOTAL KNEE ARTHROPLASTY;  Surgeon: Renette Butters, MD;  Location: [place];  Service: Orthopedics;  Laterality: Right;       Family History  Problem Relation Age of Onset   COPD Mother    Asthma Mother    Colon polyps Mother    Allergies Mother    Hypothyroidism Mother    Asthma Maternal Grandmother    Colon cancer Neg Hx     Social History   Tobacco Use   Smoking status: Former    Packs/day: 2.00    Years: 30.00    Pack years: 60.00    Types: Cigarettes    Quit date: 10/16/2007    Years since quitting: 13.5   Smokeless tobacco: Never  Vaping Use   Vaping Use: Never used  Substance Use Topics   Alcohol use: No   Drug use: No    Home Medications Prior to Admission medications   Medication Sig Start Date End Date Taking? Authorizing Provider  acetaminophen (TYLENOL) 500 MG tablet Take 500 mg by mouth every 6 (six) hours as needed.    [provider]  albuterol (VENTOLIN HFA) 108 (90 Base) MCG/ACT inhaler INHALE 2 PUFFS 4 TIMES A DAY as needed 11/27/19   [name], MD  ALPRAZolam Duanne Moron) 0.5 MG tablet TAKE 2 TABLETS BY MOUTH AT BEDTIME 04/13/21   [name], MD  AMBULATORY NON FORMULARY MEDICATION Rolling walker with a bench 01/17/21   Lyndal Pulley, DO  augmented betamethasone dipropionate (DIPROLENE-AF) 0.05 % cream Apply topically. 10/11/20   [provider]  carisoprodol (SOMA) 350 MG tablet TAKE 1 TABLET BY MOUTH THREE TIMES A DAY AS NEEDED FOR MUSCLE SPASMS 02/05/21   [name], MD  carvedilol (COREG) 12.5 MG tablet Take 1 tablet (12.5 mg total) by mouth 2 (two) times daily with a meal. 06/30/20   Larey Dresser, MD  Cholecalciferol 100 MCG (4000 UT) CAPS 1 tab by mouth once daily 03/18/21   [name], MD  dapagliflozin propanediol (FARXIGA) 10 MG TABS tablet Take 1 tablet (10 mg total) by mouth daily before breakfast. 12/20/20   Larey Dresser, MD  digoxin (LANOXIN) 0.25 MG tablet TAKE 1/2 TABLET BY MOUTH EVERY DAY 10/03/20    Larey Dresser, MD  doxepin (SINEQUAN) 10 MG capsule Take 2 capsules (20 mg total) by mouth at bedtime as needed. 12/13/20   [name], MD  Dupilumab (DUPIXENT) 300 MG/2ML SOPN Inject 1 Dose into the skin every 14 (fourteen) days.  [provider]  Evolocumab (REPATHA SURECLICK) [id number] MG/ML SOAJ Inject 1 pen into the skin every 14 (fourteen) days. 10/26/20   Larey Dresser, MD  fluticasone (FLONASE) 50 MCG/ACT nasal spray PLACE 2 SPRAYS INTO BOTH NOSTRILS DAILY AS NEEDED FOR ALLERGIES OR RHINITIS. 02/01/21   [name], MD  hydrOXYzine (ATARAX/VISTARIL) 10 MG tablet TAKE 1 TABLET BY MOUTH 3 TIMES DAILY AS NEEDED FOR ITCHING. 04/25/21   [name], MD  lidocaine (LIDODERM) 5 % APPLY 1 PATCH TO AFFECTED AREA FOR UP TO 12 HOURS . DO NOT USE MORE THAN ONE PATCH IN 24 HOURS. 02/01/21   [name], MD  omeprazole (PRILOSEC) 20 MG capsule TAKE 1 CAPSULE BY MOUTH TWICE A DAY 02/03/21   [name], MD  potassium chloride SA (KLOR-CON) 20 MEQ tablet Take 1 tablet (20 mEq total) by mouth daily. 11/07/20   Larey Dresser, MD  sacubitril-valsartan (ENTRESTO) 49-51 MG Take 0.5 tablets by mouth 2 (two) times daily. 02/28/21   Larey Dresser, MD  spironolactone (ALDACTONE) 25 MG tablet TAKE 1 TABLET BY MOUTH EVERY DAY 01/30/21   Larey Dresser, MD  tamsulosin [facility]) 0.4 MG CAPS capsule  03/11/20   [provider]  torsemide (DEMADEX) 20 MG tablet TAKE 4 TABLETS (80 MG TOTAL) BY MOUTH 2 (TWO) TIMES DAILY. 03/28/21   Larey Dresser, MD  traMADol Veatrice Bourbon) 50 MG tablet TAKE 1 TABLET BY MOUTH DAILY AS NEEDED. 03/20/21   [name], MD  traZODone (DESYREL) 100 MG tablet TAKE 1 TABLET BY MOUTH EVERY DAY AT BEDTIME AS NEEDED 09/16/20   [name], MD  TUMS 500 MG chewable tablet Chew 500-2,000 mg by mouth every 4 (four) hours as needed for indigestion or heartburn.  10/19/16   [provider]  valACYclovir (VALTREX) 1000 MG tablet TAKE 1 TABLET BY MOUTH THREE TIMES A DAY 05/02/20    [name], MD  venlafaxine XR (EFFEXOR-XR) 75 MG 24 hr capsule TAKE 1 CAPSULE BY MOUTH DAILY WITH BREAKFAST. 03/03/21   Lyndal Pulley, DO  warfarin (COUMADIN) 10 MG tablet Take 10 mg by mouth daily. Mon,wed,fri,sat    [provider]  zolpidem (AMBIEN) 10 MG tablet TAKE 1 TABLET BY MOUTH AT BEDTIME AS NEEDED 03/03/21   [name], MD    Allergies    Amiodarone, Statins, and Tape  Review of Systems   Review of Systems  Constitutional:  Negative for fever.  HENT:  Negative for ear pain and sore throat.   Eyes:  Negative for pain.  Respiratory:  Negative for cough.   Cardiovascular:  Negative for chest pain.  Gastrointestinal:  Positive for abdominal pain.  Genitourinary:  Negative for flank pain.  Musculoskeletal:  Negative for back pain.  Skin:  Negative for color change and rash.  Neurological:  Negative for syncope.  All other systems reviewed and are negative.  Physical Exam Updated Vital Signs BP (!) 93/40   Pulse 69   Temp (!) 97.3 F (36.3 C) (Tympanic)   Resp (!) 23   SpO2 99%   Physical Exam Constitutional:      General: He is not in acute distress.    Appearance: He is well-developed.  HENT:     Head: Normocephalic.     Nose: Nose normal.  Eyes:     Extraocular Movements: Extraocular movements intact.  Cardiovascular:     Rate and Rhythm: Normal rate.  Pulmonary:     Effort: Pulmonary  effort is normal.  Abdominal:     Comments: Mild tenderness in the left lower quadrant.  No guarding or rebound.  Skin:    Coloration: Skin is not jaundiced.  Neurological:     Mental Status: He is alert. Mental status is at baseline.    ED Results / Procedures / Treatments   Labs (all labs ordered are listed, but only abnormal results are displayed) Labs Reviewed  CBC WITH DIFFERENTIAL/PLATELET - Abnormal; Notable for the following components:      Result Value   WBC 14.5 (*)    RBC 1.65 (*)    Hemoglobin 5.6 (*)    HCT 17.4 (*)    MCV 105.5 (*)     Neutro Abs 11.5 (*)    Monocytes Absolute 1.2 (*)    Abs Immature Granulocytes 0.20 (*)    All other components within normal limits  COMPREHENSIVE METABOLIC PANEL - Abnormal; Notable for the following components:   CO2 21 (*)    Glucose, Bld 212 (*)    Creatinine, Ser 1.45 (*)    Calcium 8.2 (*)    Total Protein 4.4 (*)    Albumin 2.6 (*)    Alkaline Phosphatase 37 (*)    GFR, Estimated 53 (*)    All other components within normal limits  LACTIC ACID, PLASMA - Abnormal; Notable for the following components:   Lactic Acid, Venous 3.9 (*)    All other components within normal limits  LACTIC ACID, PLASMA - Abnormal; Notable for the following components:   Lactic Acid, Venous 3.0 (*)    All other components within normal limits  POC OCCULT BLOOD, ED - Abnormal; Notable for the following components:   Fecal Occult Bld POSITIVE (*)    All other components within normal limits  RESP PANEL BY RT-PCR (FLU A&B, COVID) ARPGX2  CULTURE, BLOOD (ROUTINE X 2)  CULTURE, BLOOD (ROUTINE X 2)  URINE CULTURE  LIPASE, BLOOD  BRAIN NATRIURETIC PEPTIDE  MAGNESIUM  URINALYSIS, ROUTINE W REFLEX MICROSCOPIC  PROTIME-INR  HIV ANTIBODY (ROUTINE TESTING W REFLEX)  CBC  CBC  HEMOGLOBIN A1C  TYPE AND SCREEN  PREPARE RBC (CROSSMATCH)  TROPONIN I (HIGH SENSITIVITY)  TROPONIN I (HIGH SENSITIVITY)    EKG EKG Interpretation  Date/Time:  Friday May 05 2021 07:51:10 EDT Ventricular Rate:  71 PR Interval:    QRS Duration: 178 QT Interval:  504 QTC Calculation: 548 R Axis:   127 Text Interpretation: Accelerated junctional rhythm Nonspecific intraventricular conduction delay Borderline ST depression, inferior leads Confirmed by Thamas Jaegers ([id number]) on 05/05/2021 8:35:16 AM  Radiology DG Chest Port 1 View  Result Date: 05/05/2021 CLINICAL DATA:  Sepsis and CHF EXAM: PORTABLE CHEST 1 VIEW COMPARISON:  12/22/2019 FINDINGS: Cardiomegaly and vascular pedicle widening. Biventricular ICD/pacer from the  left. Small pleural effusions. No pulmonary edema or air bronchogram. IMPRESSION: Cardiomegaly and small pleural effusions. Electronically Signed   By: Monte Fantasia M.D.   On: 05/05/2021 08:43    Procedures .Critical Care  Date/Time: 05/05/2021 11:33 AM Performed by: Luna Fuse, MD Authorized by: Luna Fuse, MD   Critical care provider statement:    Critical care time (minutes):  40   Critical care time was exclusive of:  Separately billable procedures and treating other patients   Critical care was necessary to treat or prevent imminent or life-threatening deterioration of the following conditions:  Circulatory failure and shock Comments:     Hypotension, severe anemia, GI bleed   Medications Ordered in ED  Medications  0.9 %  sodium chloride infusion (has no administration in time range)  phytonadione (VITAMIN K) 10 mg in dextrose 5 % 50 mL IVPB (has no administration in time range)  lactated ringers bolus 500 mL (has no administration in time range)  0.9 %  sodium chloride infusion (has no administration in time range)  norepinephrine (LEVOPHED) '4mg'$  in 234m premix infusion (has no administration in time range)  pantoprazole (PROTONIX) injection 40 mg (has no administration in time range)  docusate sodium (COLACE) capsule 100 mg (has no administration in time range)  polyethylene glycol (MIRALAX / GLYCOLAX) packet 17 g (has no administration in time range)  ondansetron (ZOFRAN) injection 4 mg (has no administration in time range)  albuterol (PROVENTIL) (2.5 MG/3ML) 0.083% nebulizer solution 2.5 mg (has no administration in time range)  insulin aspart (novoLOG) injection 0-15 Units (has no administration in time range)  sodium chloride 0.9 % bolus 500 mL (0 mLs Intravenous Stopped 05/05/21 0959)  sodium chloride 0.9 % bolus 1,000 mL (1,000 mLs Intravenous New Bag/Given 05/05/21 1102)  pantoprazole (PROTONIX) injection 40 mg (40 mg Intravenous Given 05/05/21 1103)  iohexol  (OMNIPAQUE) 350 MG/ML injection 100 mL (100 mLs Intravenous Contrast Given 05/05/21 1100)    ED Course  I have reviewed the triage vital signs and the nursing notes.  Pertinent labs & imaging results that were available during my care of the patient were reviewed by me and considered in my medical decision making (see chart for details).    MDM Rules/Calculators/A&P                           Patient blood pressure was soft initially then dropped down to 80 systolic.  Given IV fluid resuscitation.  He had received 800 mL prior to arrival via in the ambulance.  Hemoglobin returned very low at 5.6.  Risks and benefits discussed, patient ordered for 2 units of blood transfusion.  Case discussed with on-call gastroenterologist who will come and see the patient.  INR report is still pending.  As the patient is on Coumadin.  CT abdomen pelvis ordered.  Patient admitted to the ICU.  Final Clinical Impression(s) / ED Diagnoses Final diagnoses:  Vomiting and diarrhea  Hypotension, unspecified hypotension type  Gastrointestinal hemorrhage, unspecified gastrointestinal hemorrhage type  Severe anemia    Rx / DC Orders ED Discharge Orders     None        HLuna Fuse MD 05/05/21 1135

## 2021-05-05 NOTE — ED Notes (Signed)
Placed patient on a external cath on patient pt is resting with call bell in reach

## 2021-05-05 NOTE — Consult Note (Signed)
Referring Provider: Dr. Thamas Jaegers Primary Care Physician:  [name], MD Primary Gastroenterologist:  Dr. Fuller Plan  Reason for Consultation: Vomiting, bloody diarrhea and anemia  HPI: [name] is a 65 y.o. male is a 65 year old male with a past medical history of anxiety, depression, fibromyalgia, arthritis, asthma, hypertension, atrial fibrillation, coronary artery disease, cardiomyopathy, CHF with LV EF 35 -40%, heart block  s/p pacemaker s/p BiV ICD 09/2017, s/p AV nodal ablation 1/209, diabetes mellitus type 2, left leg wound, COPD, sleep apnea, diverticulitis and GERD.  He developed lower abdominal pain one week ago then N/V/D with generalized weakness. His contacted his family and they called 911.  When the EMS team arrived he was diaphoretic, BP 62/58 with a heart rate of 74.  He received 800 cc normal saline IV bolus and was transported to [facility] ED early this morning for further evaluation.  Labs in the ED showed a sodium level of 136.  Potassium 3.9.  Glucose 212.  BUN 21.  Creatinine 1.45 (baseline Cr 1.34).  Magnesium 1.9.  Alk phos 37.  Albumin 2.6.  Lipase 28.  AST 17.  ALT 15.  Total bili 0.6.  BNP 25.0.  Troponin 13.  Lactic acid 3.9.  WBC 14.5.  Hemoglobin 5.6.  Hematocrit 17.4.  MCV 105.5.  Platelet 234.  SARS coronavirus 2 negative.  Influenza AMB negative.  An abdominal/pelvic CT angiogram with contrast showed evidence of colonic diverticulosis with mild versus resolving proximal sigmoid diverticulitis without abscess and no evidence of active GI bleeding.  He received a total of 2-1/2 L of normal saline IV and Protonix 40 mg IV.  2 units of PRBCs have been ordered, the first unit is currently infusing.  His RN is getting ready to start a Levophed drip as his SBP remains 70 -80's.   He developed left lower quadrant abdominal pain approximately 1 week ago.  On Monday 7/18 he went to the wound clinic and he felt quite fatigued and he reported his blood pressure  was about 80/40.  Tuesday 7/19 he took his BP at home which read 107/43.  He developed nausea with dark brown nonbloody diarrhea, 4-5 episodes for the next 3 days.  Today he passed one episode of red bloody diarrhea and vomited x1.  His emesis was nonbloody but he stated he did not really look at it.  No prior history of GI bleed.  He has chronic GERD symptoms.  He takes Omeprazole 20 mg daily.  He has heartburn most days and intermittently takes Tums.  He has infrequent dysphagia, he describes food gets stuck to the mid esophagus and he drinks water and the stuck food passes down which occurs once or twice weekly.  No upper abdominal pain.  He takes ibuprofen 200 mg 2 to 3 tablets 3-4 times daily for years due to having severe left knee pain, followed by orthopedist Dr. Jean Rosenthal with potential plans for future knee replacement surgery.  He last took Warfarin and 11 PM on 05/04/2021.  He denies having passing any black stools.  He has a history of colon polyps, his last colonoscopy was 03/04/2015 at [facility] and 2 small ascending colon all lipomas were seen with extensive diverticulosis and hemorrhoids.  He was advised to repeat a colonoscopy in 5 years which was not done.  He has a history of significant heart disease as noted above.  He complains of having chest pain with shortness of breath with exertion for  the past 3 to 4 days as well.  No current chest pain.  No family at the bedside at this time.  No further vomiting since arriving to the ED.  His RN noted a small amount of red blood in his underwear garment otherwise no further hematochezia or diarrhea.  He will be admitted to the ICU.   Abdominal/pelvic CTA with contrast: 1. No active extravasation into the bowel. 2. Colonic diverticulosis with questionable mild or resolving proximal sigmoid diverticulitis, no abscess. 3. Nonobstructive bilateral nephrolithiasis 4. Coronary and Aortic Atherosclerosis  Colonoscopy 10/21/2014 Dr.  Fuller Plan: 1. Sessile tubular adenomatous polyp in the transverse colon; polypectomy performed using snare cautery 2. Sessile adenomatous polyp in the transverse colon; polypectomy performed with a cold snare 3. Moderate diverticulosis in the sigmoid colon and descending colon 4. Grade Il internal hemorrhoids  Colonoscopy 03/04/2015 by Dr. Dianna Rossetti at [facility]: 2 small ascending colon lipomas were seen.  There was extensive diverticulosis.  Hemorrhoids. Repeat colonoscopy in 5 years was recommended.  Colonoscopy 06/01/2008: 3 polyps removed from the transverse colon and hepatic flexure Diverticulosis to the descending and sigmoid colon  Colonoscopy 04/25/2005: 5 polyps removed, diverticulosis  EGD 04/25/2005: 5 mm sessile Hamartomatous polyp removed from the gastric antrum Esophagus was empirically dilated secondary to the patient having dysphagia symptoms  1. COLON, POLYP(S): ADENOMATOUS POLYP(S). NO HIGH GRADE   DYSPLASIA OR INVASIVE MALIGNANCY IDENTIFIED (ENDOSCOPIC BIOPSY,   ASCENDING AND TRANSVERSE COLON). SEE COMMENT.    2. ENDOSCOPIC BIOPSY, LEFT SIGMOID COLON: BENIGN COLONIC MUCOSA   WITH A MINIMAL INCREASE IN INFLAMMATORY CELLS WITHIN THE LAMINA   [place] AND MINIMAL ARCHITECTURAL DISTORTION.    3. ENDOSCOPIC BIOPSY, STOMACH POLYPS: HAMARTOMATOUS POLYP, SEE   COMMENT  Past Medical History:  Diagnosis Date   AICD (automatic cardioverter/defibrillator) present    Anemia    supposed to be taking Vit B but doesn't   ANXIETY    takes Xanax nightly   Arthritis    Asthma    Albuterol prn and Advair daily;also takes Prednisone daily   Atrial fibrillation ([place]) 09/06/2015   Cardiomyopathy ([place])    a. EF 25% TEE July 2013; b. EF normalized 2015;  c. 03/2015 Echo: EF 40-45%, difrf HK, PASP 38 mmHg, Mild MR, sev LAE/RAE.   CHF (congestive heart failure) (HCC)    Chronic constipation    takes OTC stool softener   COPD (chronic obstructive pulmonary disease) (HCC)    "one dr says COPD;  one dr says emphysema" (09/18/2017)   DEPRESSION    takes Zoloft and Doxepin daily   Diverticulitis    DYSKINESIA, ESOPHAGUS    Emphysema of lung ([place])    "one dr says COPD; one dr says emphysema" (09/18/2017)   Essential hypertension        FIBROMYALGIA    GERD (gastroesophageal reflux disease)        Glaucoma    HYPERLIPIDEMIA    a. Intolerant to statins.   INSOMNIA    takes Ambien nightly   Myocardial infarction [facility])    a. 2012 Myoview notable for prior infarct;  b. 03/2015 Lexiscan CL: EF 37%, diff HK, small area of inferior infarct from apex to base-->Med Rx.   Myocardial infarction ([place])    O2 dependent    "2.5L q hs & prn" (09/18/2017)   Paroxysmal atrial fibrillation ([place])    a. CHA2DS2VASc = 3--> takes Coumadin;  b. 03/15/2015 Successful TEE/DCCV;  c. 03/2015 recurrent afib, Amio d/c'd in setting of hyperthyroidism.   Peripheral neuropathy  Pneumonia 12/2016   Rash and other nonspecific skin eruption 04/12/2009   no cause found saw dermatologists x 2 and allergist   SLEEP APNEA, OBSTRUCTIVE    a. doesn't use CPAP   Syncope    a. 03/2015 s/p MDT LINQ.   Type II diabetes mellitus ([place])         Past Surgical History:  Procedure Laterality Date   ACNE CYST REMOVAL     2 on back    AV NODE ABLATION N/A 10/25/2017   Procedure: AV NODE ABLATION;  Surgeon: Deboraha Sprang, MD;  Location: [place] [facility];  Service: Cardiovascular;  Laterality: N/A;   BIV ICD INSERTION CRT-D N/A 09/18/2017   Procedure: BIV ICD INSERTION CRT-D;  Surgeon: Deboraha Sprang, MD;  Location: [place] [facility];  Service: Cardiovascular;  Laterality: N/A;   CARDIAC CATHETERIZATION N/A 03/21/2016   Procedure: Right/Left Heart Cath and Coronary Angiography;  Surgeon: Larey Dresser, MD;  Location: [place] [facility];  Service: Cardiovascular;  Laterality: N/A;   CARDIOVERSION  04/18/2012   Procedure: CARDIOVERSION;  Surgeon: Fay Records, MD;  Location: [place];  Service: Cardiovascular;  Laterality: N/A;    CARDIOVERSION  04/25/2012   Procedure: CARDIOVERSION;  Surgeon: Thayer Headings, MD;  Location: [place];  Service: Cardiovascular;  Laterality: N/A;   CARDIOVERSION  04/25/2012   Procedure: CARDIOVERSION;  Surgeon: Fay Records, MD;  Location: [place];  Service: Cardiovascular;  Laterality: N/A;   CARDIOVERSION  05/09/2012   Procedure: CARDIOVERSION;  Surgeon: Sherren Mocha, MD;  Location: [place];  Service: Cardiovascular;  Laterality: N/A;  changed from crenshaw to cooper by trish/leone-endo   CARDIOVERSION N/A 03/15/2015   Procedure: CARDIOVERSION;  Surgeon: Thayer Headings, MD;  Location: [place];  Service: Cardiovascular;  Laterality: N/A;   COLONOSCOPY     COLONOSCOPY WITH PROPOFOL N/A 10/21/2014   Procedure: COLONOSCOPY WITH PROPOFOL;  Surgeon: Ladene Artist, MD;  Location: [facility];  Service: Endoscopy;  Laterality: N/A;   EP IMPLANTABLE DEVICE N/A 04/06/2015   Procedure: Loop Recorder Insertion;  Surgeon: Evans Lance, MD;  Location: [place] [facility];  Service: Cardiovascular;  Laterality: N/A;   ESOPHAGOGASTRODUODENOSCOPY     JOINT REPLACEMENT     LOOP RECORDER REMOVAL N/A 09/18/2017   Procedure: LOOP RECORDER REMOVAL;  Surgeon: Deboraha Sprang, MD;  Location: [place] [facility];  Service: Cardiovascular;  Laterality: N/A;   RIGHT/LEFT HEART CATH AND CORONARY ANGIOGRAPHY N/A 01/28/2017   Procedure: Right/Left Heart Cath and Coronary Angiography;  Surgeon: Larey Dresser, MD;  Location: [place] [facility];  Service: Cardiovascular;  Laterality: N/A;   TEE WITHOUT CARDIOVERSION  04/25/2012   Procedure: TRANSESOPHAGEAL ECHOCARDIOGRAM (TEE);  Surgeon: Thayer Headings, MD;  Location: [place];  Service: Cardiovascular;  Laterality: N/A;   TEE WITHOUT CARDIOVERSION N/A 03/15/2015   Procedure: TRANSESOPHAGEAL ECHOCARDIOGRAM (TEE);  Surgeon: Thayer Headings, MD;  Location: [place];  Service: Cardiovascular;  Laterality: N/A;   TONSILLECTOMY AND ADENOIDECTOMY     TOTAL  KNEE ARTHROPLASTY Right 06/15/2014   Procedure: TOTAL KNEE ARTHROPLASTY;  Surgeon: Renette Butters, MD;  Location: [place];  Service: Orthopedics;  Laterality: Right;    Prior to Admission medications   Medication Sig Start Date End Date Taking? Authorizing Provider  acetaminophen (TYLENOL) 500 MG tablet Take 500 mg by mouth every 6 (six) hours as needed.    [provider]  albuterol (VENTOLIN HFA) 108 (90 Base) MCG/ACT inhaler INHALE  2 PUFFS 4 TIMES A DAY as needed 11/27/19   [name], MD  ALPRAZolam Duanne Moron) 0.5 MG tablet TAKE 2 TABLETS BY MOUTH AT BEDTIME 04/13/21   [name], MD  AMBULATORY NON FORMULARY MEDICATION Rolling walker with a bench 01/17/21   Lyndal Pulley, DO  augmented betamethasone dipropionate (DIPROLENE-AF) 0.05 % cream Apply topically. 10/11/20   [provider]  carisoprodol (SOMA) 350 MG tablet TAKE 1 TABLET BY MOUTH THREE TIMES A DAY AS NEEDED FOR MUSCLE SPASMS 02/05/21   [name], MD  carvedilol (COREG) 12.5 MG tablet Take 1 tablet (12.5 mg total) by mouth 2 (two) times daily with a meal. 06/30/20   Larey Dresser, MD  Cholecalciferol 100 MCG (4000 UT) CAPS 1 tab by mouth once daily 03/18/21   [name], MD  dapagliflozin propanediol (FARXIGA) 10 MG TABS tablet Take 1 tablet (10 mg total) by mouth daily before breakfast. 12/20/20   Larey Dresser, MD  digoxin (LANOXIN) 0.25 MG tablet TAKE 1/2 TABLET BY MOUTH EVERY DAY 10/03/20   Larey Dresser, MD  doxepin (SINEQUAN) 10 MG capsule Take 2 capsules (20 mg total) by mouth at bedtime as needed. 12/13/20   [name], MD  Dupilumab (DUPIXENT) 300 MG/2ML SOPN Inject 1 Dose into the skin every 14 (fourteen) days.    [provider]  Evolocumab (REPATHA SURECLICK) 387 MG/ML SOAJ Inject 1 pen into the skin every 14 (fourteen) days. 10/26/20   Larey Dresser, MD  fluticasone (FLONASE) 50 MCG/ACT nasal spray PLACE 2 SPRAYS INTO BOTH NOSTRILS DAILY AS NEEDED FOR ALLERGIES OR RHINITIS. 02/01/21    [name], MD  hydrOXYzine (ATARAX/VISTARIL) 10 MG tablet TAKE 1 TABLET BY MOUTH 3 TIMES DAILY AS NEEDED FOR ITCHING. 04/25/21   [name], MD  lidocaine (LIDODERM) 5 % APPLY 1 PATCH TO AFFECTED AREA FOR UP TO 12 HOURS . DO NOT USE MORE THAN ONE PATCH IN 24 HOURS. 02/01/21   [name], MD  omeprazole (PRILOSEC) 20 MG capsule TAKE 1 CAPSULE BY MOUTH TWICE A DAY 02/03/21   [name], MD  potassium chloride SA (KLOR-CON) 20 MEQ tablet Take 1 tablet (20 mEq total) by mouth daily. 11/07/20   Larey Dresser, MD  sacubitril-valsartan (ENTRESTO) 49-51 MG Take 0.5 tablets by mouth 2 (two) times daily. 02/28/21   Larey Dresser, MD  spironolactone (ALDACTONE) 25 MG tablet TAKE 1 TABLET BY MOUTH EVERY DAY 01/30/21   Larey Dresser, MD  tamsulosin [facility]) 0.4 MG CAPS capsule  03/11/20   [provider]  torsemide (DEMADEX) 20 MG tablet TAKE 4 TABLETS (80 MG TOTAL) BY MOUTH 2 (TWO) TIMES DAILY. 03/28/21   Larey Dresser, MD  traMADol Veatrice Bourbon) 50 MG tablet TAKE 1 TABLET BY MOUTH DAILY AS NEEDED. 03/20/21   [name], MD  traZODone (DESYREL) 100 MG tablet TAKE 1 TABLET BY MOUTH EVERY DAY AT BEDTIME AS NEEDED 09/16/20   [name], MD  TUMS 500 MG chewable tablet Chew 500-2,000 mg by mouth every 4 (four) hours as needed for indigestion or heartburn.  10/19/16   [provider]  valACYclovir (VALTREX) 1000 MG tablet TAKE 1 TABLET BY MOUTH THREE TIMES A DAY 05/02/20   [name], MD  venlafaxine XR (EFFEXOR-XR) 75 MG 24 hr capsule TAKE 1 CAPSULE BY MOUTH DAILY WITH BREAKFAST. 03/03/21   Lyndal Pulley, DO  warfarin (COUMADIN) 10 MG tablet Take 10 mg  by mouth daily. Mon,wed,fri,sat    [provider]  zolpidem (AMBIEN) 10 MG tablet TAKE 1 TABLET BY MOUTH AT BEDTIME AS NEEDED 03/03/21   [name], MD    Current Facility-Administered Medications  Medication Dose Route Frequency Provider Last Rate Last Admin   0.9 %  sodium chloride infusion  10 mL/hr Intravenous  Once Hong, Greggory Brandy, MD       pantoprozole (PROTONIX) 80 mg /NS 100 mL infusion  8 mg/hr Intravenous Continuous Luna Fuse, MD 10 mL/hr at 05/05/21 1103 8 mg/hr at 05/05/21 1103   Current Outpatient Medications  Medication Sig Dispense Refill   acetaminophen (TYLENOL) 500 MG tablet Take 500 mg by mouth every 6 (six) hours as needed.     albuterol (VENTOLIN HFA) 108 (90 Base) MCG/ACT inhaler INHALE 2 PUFFS 4 TIMES A DAY as needed 54 g 3   ALPRAZolam (XANAX) 0.5 MG tablet TAKE 2 TABLETS BY MOUTH AT BEDTIME 60 tablet 2   AMBULATORY NON FORMULARY MEDICATION Rolling walker with a bench 1 Units 0   augmented betamethasone dipropionate (DIPROLENE-AF) 0.05 % cream Apply topically.     carisoprodol (SOMA) 350 MG tablet TAKE 1 TABLET BY MOUTH THREE TIMES A DAY AS NEEDED FOR MUSCLE SPASMS 90 tablet 5   carvedilol (COREG) 12.5 MG tablet Take 1 tablet (12.5 mg total) by mouth 2 (two) times daily with a meal. 180 tablet 3   Cholecalciferol 100 MCG (4000 UT) CAPS 1 tab by mouth once daily 30 capsule 99   dapagliflozin propanediol (FARXIGA) 10 MG TABS tablet Take 1 tablet (10 mg total) by mouth daily before breakfast. 30 tablet 11   digoxin (LANOXIN) 0.25 MG tablet TAKE 1/2 TABLET BY MOUTH EVERY DAY 45 tablet 3   doxepin (SINEQUAN) 10 MG capsule Take 2 capsules (20 mg total) by mouth at bedtime as needed. 180 capsule 1   Dupilumab (DUPIXENT) 300 MG/2ML SOPN Inject 1 Dose into the skin every 14 (fourteen) days.     Evolocumab (REPATHA SURECLICK) 960 MG/ML SOAJ Inject 1 pen into the skin every 14 (fourteen) days. 6 mL 3   fluticasone (FLONASE) 50 MCG/ACT nasal spray PLACE 2 SPRAYS INTO BOTH NOSTRILS DAILY AS NEEDED FOR ALLERGIES OR RHINITIS. 48 mL 1   hydrOXYzine (ATARAX/VISTARIL) 10 MG tablet TAKE 1 TABLET BY MOUTH 3 TIMES DAILY AS NEEDED FOR ITCHING. 60 tablet 2   lidocaine (LIDODERM) 5 % APPLY 1 PATCH TO AFFECTED AREA FOR UP TO 12 HOURS . DO NOT USE MORE THAN ONE PATCH IN 24 HOURS. 90 patch 2   omeprazole  (PRILOSEC) 20 MG capsule TAKE 1 CAPSULE BY MOUTH TWICE A DAY 180 capsule 1   potassium chloride SA (KLOR-CON) 20 MEQ tablet Take 1 tablet (20 mEq total) by mouth daily. 90 tablet 3   sacubitril-valsartan (ENTRESTO) 49-51 MG Take 0.5 tablets by mouth 2 (two) times daily. 180 tablet 3   spironolactone (ALDACTONE) 25 MG tablet TAKE 1 TABLET BY MOUTH EVERY DAY 90 tablet 3   tamsulosin (FLOMAX) 0.4 MG CAPS capsule      torsemide (DEMADEX) 20 MG tablet TAKE 4 TABLETS (80 MG TOTAL) BY MOUTH 2 (TWO) TIMES DAILY. 720 tablet 1   traMADol (ULTRAM) 50 MG tablet TAKE 1 TABLET BY MOUTH DAILY AS NEEDED. 30 tablet 5   traZODone (DESYREL) 100 MG tablet TAKE 1 TABLET BY MOUTH EVERY DAY AT BEDTIME AS NEEDED 90 tablet 1   TUMS 500 MG chewable tablet Chew 500-2,000 mg by mouth every  4 (four) hours as needed for indigestion or heartburn.      valACYclovir (VALTREX) 1000 MG tablet TAKE 1 TABLET BY MOUTH THREE TIMES A DAY 21 tablet 2   venlafaxine XR (EFFEXOR-XR) 75 MG 24 hr capsule TAKE 1 CAPSULE BY MOUTH DAILY WITH BREAKFAST. 90 capsule 1   warfarin (COUMADIN) 10 MG tablet Take 10 mg by mouth daily. Mon,wed,fri,sat     zolpidem (AMBIEN) 10 MG tablet TAKE 1 TABLET BY MOUTH AT BEDTIME AS NEEDED 90 tablet 1    Allergies as of 05/05/2021 - Review Complete 05/05/2021  Allergen Reaction Noted   Amiodarone Other (See Comments) 09/01/2015   Statins Other (See Comments) 02/18/2012   Tape Other (See Comments) 03/09/2015    Family History  Problem Relation Age of Onset   COPD Mother    Asthma Mother    Colon polyps Mother    Allergies Mother    Hypothyroidism Mother    Asthma Maternal Grandmother    Colon cancer Neg Hx     Social History   Socioeconomic History   Marital status: Divorced    Spouse name: Not on file   Number of children: 2   Years of education: Not on file   Highest education level: Not on file  Occupational History   Occupation: retired/disabled. prev worked in Therapist, sports.     Employer: DISABLED  Tobacco Use   Smoking status: Former    Packs/day: 2.00    Years: 30.00    Pack years: 60.00    Types: Cigarettes    Quit date: 10/16/2007    Years since quitting: 13.5   Smokeless tobacco: Never  Vaping Use   Vaping Use: Never used  Substance and Sexual Activity   Alcohol use: No   Drug use: No   Sexual activity: Not Currently  Other Topics Concern   Not on file  Social History Narrative   Lives alone.   Social Determinants of Health   Financial Resource Strain: Low Risk    Difficulty of Paying Living Expenses: Not hard at all  Food Insecurity: No Food Insecurity   Worried About Charity fundraiser in the Last Year: Never true   [place] in the Last Year: Never true  Transportation Needs: No Transportation Needs   Lack of Transportation (Medical): No   Lack of Transportation (Non-Medical): No  Physical Activity: Inactive   Days of Exercise per Week: 0 days   Minutes of Exercise per Session: 0 min  Stress: No Stress Concern Present   Feeling of Stress : Not at all  Social Connections: Moderately Integrated   Frequency of Communication with Friends and Family: More than three times a week   Frequency of Social Gatherings with Friends and Family: More than three times a week   Attends Religious Services: More than 4 times per year   Active Member of [employer] or Organizations: Yes   Attends Music therapist: More than 4 times per year   Marital Status: Never married  Human resources officer Violence: Not At Risk   Fear of Current or Ex-Partner: No   Emotionally Abused: No   Physically Abused: No   Sexually Abused: No    Review of Systems: See HPI, all other systems reviewed and are negative  Physical Exam: Vital signs in last 24 hours: Temp:  [97.2 F (36.2 C)] 97.2 F (36.2 C) (07/22 0754) Pulse Rate:  [69-74] 74 (07/22 1045) Resp:  [20-29] 21 (07/22 1045) BP: (77-100)/(35-48) 96/41 (  07/22 0945) SpO2:  [98 %-100 %] 98 % (07/22  1045)   General:  Alert critically ill.  65 year old male. Head:  Normocephalic and atraumatic. Eyes:  No scleral icterus. Conjunctiva pink. Ears:  Normal auditory acuity. Nose:  No deformity, discharge or lesions. Mouth:  Dentition intact. No ulcers or lesions.  Neck:  Supple. No lymphadenopathy or thyromegaly.  Lungs: Sounds clear, diminished in the bases. Heart: Regular rate and rhythm, soft systolic murmur. Abdomen: Soft, nondistended.  Mild tenderness throughout the upper abdomen without rebound or guarding.  Left lower quadrant tenderness without rebound or guarding.  Hypoactive bowel sounds all 4 quadrants. Rectal: Deferred.  Musculoskeletal:  Symmetrical without gross deformities.  Pulses:  Normal pulses noted. Extremities: Left leg with Ace wrap/bandage intact.  No lower extremity edema. Neurologic:  Alert and  oriented x4. No focal deficits.  Skin:  Intact without significant lesions or rashes. Psych:  Alert and cooperative. Normal mood and affect.  Intake/Output from previous day: No intake/output data recorded. Intake/Output this shift: No intake/output data recorded.  Lab Results: Recent Labs    05/05/21 0801  WBC 14.5*  HGB 5.6*  HCT 17.4*  PLT 234   BMET Recent Labs    05/05/21 0801  NA 136  K 3.9  CL 108  CO2 21*  GLUCOSE 212*  BUN 21  CREATININE 1.45*  CALCIUM 8.2*   LFT Recent Labs    05/05/21 0801  PROT 4.4*  ALBUMIN 2.6*  AST 17  ALT 15  ALKPHOS 37*  BILITOT 0.6   PT/INR No results for input(s): LABPROT, INR in the last 72 hours. Hepatitis Panel No results for input(s): HEPBSAG, HCVAB, HEPAIGM, HEPBIGM in the last 72 hours.   Studies/Results: DG Chest Port 1 View  Result Date: 05/05/2021 CLINICAL DATA:  Sepsis and CHF EXAM: PORTABLE CHEST 1 VIEW COMPARISON:  12/22/2019 FINDINGS: Cardiomegaly and vascular pedicle widening. Biventricular ICD/pacer from the left. Small pleural effusions. No pulmonary edema or air bronchogram.  IMPRESSION: Cardiomegaly and small pleural effusions. Electronically Signed   By: Monte Fantasia M.D.   On: 05/05/2021 08:43    IMPRESSION/PLAN:  36.  65 year old male with N/V/D and lower abdominal pain with hematochezia and anemia.  Hemoglobin 5.6.  2 units of PRBCs ordered, the first unit infusing at this time. INR pending. Hypotensive, received 2.5L NS IV. On Levo gtt. Abd/pelvic CT showed evidence of possible proximal sigmoid diverticulitis without evidence of active GI bleeding.  Suspect diverticular bleeding in setting of diverticulitis. -NPO with ice chips for now -Check H/H post transfusion then check H/H Q 6 hrs x 24 hours -Will treat with IV antibiotics for diverticulitis, Rocephin and Flagyl per pharm consult -Eventual diagnostic colonoscopy -No NSAIDs -Hold Coumadin  -Further recommendations per Dr. Havery Moros   2. GERD, dysphagia. Chronic NSAID use. -Eventual EGD  -PPI IV BID  3. History of colon tubular adenomatous polyps   4.  History of atrial fibrillation.  Last dose of Coumadin was on 05/04/2021.  5.  History of CAD, cardiomyopathy, CHF, heart block with pacemaker and ICD.  6.  DM type II  7. AKI on CKD. Cr 1.34 -> 1.45.  8. Chronic LLE wound       Patrecia Pour Kennedy-Smith  05/05/2021, 11:06 AM

## 2021-05-05 NOTE — Plan of Care (Signed)
  Problem: Education: Goal: Ability to identify signs and symptoms of gastrointestinal bleeding will improve Outcome: Progressing   Problem: Bowel/Gastric: Goal: Will show no signs and symptoms of gastrointestinal bleeding Outcome: Progressing   Problem: Fluid Volume: Goal: Will show no signs and symptoms of excessive bleeding Outcome: Progressing   Problem: Clinical Measurements: Goal: Complications related to the disease process, condition or treatment will be avoided or minimized Outcome: Progressing   Problem: Health Behavior/Discharge Planning: Goal: Ability to manage health-related needs will improve Outcome: Progressing   Problem: Clinical Measurements: Goal: Ability to maintain clinical measurements within normal limits will improve Outcome: Progressing Goal: Will remain free from infection Outcome: Progressing Goal: Diagnostic test results will improve Outcome: Progressing Goal: Cardiovascular complication will be avoided Outcome: Progressing   Problem: Activity: Goal: Risk for activity intolerance will decrease Outcome: Progressing   Problem: Nutrition: Goal: Adequate nutrition will be maintained Outcome: Progressing   Problem: Elimination: Goal: Will not experience complications related to bowel motility Outcome: Progressing Goal: Will not experience complications related to urinary retention Outcome: Progressing   Problem: Pain Managment: Goal: General experience of comfort will improve Outcome: Progressing

## 2021-05-06 ENCOUNTER — Encounter (HOSPITAL_COMMUNITY): Payer: Self-pay | Admitting: Pulmonary Disease

## 2021-05-06 DIAGNOSIS — D649 Anemia, unspecified: Secondary | ICD-10-CM | POA: Diagnosis not present

## 2021-05-06 DIAGNOSIS — K5793 Diverticulitis of intestine, part unspecified, without perforation or abscess with bleeding: Secondary | ICD-10-CM | POA: Diagnosis not present

## 2021-05-06 DIAGNOSIS — K922 Gastrointestinal hemorrhage, unspecified: Secondary | ICD-10-CM | POA: Diagnosis not present

## 2021-05-06 DIAGNOSIS — R578 Other shock: Secondary | ICD-10-CM | POA: Diagnosis not present

## 2021-05-06 DIAGNOSIS — K5732 Diverticulitis of large intestine without perforation or abscess without bleeding: Secondary | ICD-10-CM | POA: Diagnosis not present

## 2021-05-06 LAB — GLUCOSE, CAPILLARY
Glucose-Capillary: 121 mg/dL — ABNORMAL HIGH (ref 70–99)
Glucose-Capillary: 130 mg/dL — ABNORMAL HIGH (ref 70–99)
Glucose-Capillary: 166 mg/dL — ABNORMAL HIGH (ref 70–99)
Glucose-Capillary: 113 mg/dL — ABNORMAL HIGH (ref 70–99)
Glucose-Capillary: 125 mg/dL — ABNORMAL HIGH (ref 70–99)
Glucose-Capillary: 119 mg/dL — ABNORMAL HIGH (ref 70–99)

## 2021-05-06 LAB — PREPARE RBC (CROSSMATCH)

## 2021-05-06 LAB — CBC
HCT: 19.1 % — ABNORMAL LOW (ref 39.0–52.0)
HCT: 20.6 % — ABNORMAL LOW (ref 39.0–52.0)
HCT: 23.5 % — ABNORMAL LOW (ref 39.0–52.0)
Hemoglobin: 6.6 g/dL — CL (ref 13.0–17.0)
Hemoglobin: 6.9 g/dL — CL (ref 13.0–17.0)
Hemoglobin: 7.8 g/dL — ABNORMAL LOW (ref 13.0–17.0)
MCH: 30.4 pg (ref 26.0–34.0)
MCH: 30.8 pg (ref 26.0–34.0)
MCH: 33.5 pg (ref 26.0–34.0)
MCHC: 33.2 g/dL (ref 30.0–36.0)
MCHC: 33.5 g/dL (ref 30.0–36.0)
MCHC: 34.6 g/dL (ref 30.0–36.0)
MCV: 91.4 fL (ref 80.0–100.0)
MCV: 92 fL (ref 80.0–100.0)
MCV: 97 fL (ref 80.0–100.0)
Platelets: 210 10*3/uL (ref 150–400)
Platelets: 233 10*3/uL (ref 150–400)
Platelets: UNDETERMINED 10*3/uL (ref 150–400)
RBC: 1.97 MIL/uL — ABNORMAL LOW (ref 4.22–5.81)
RBC: 2.24 MIL/uL — ABNORMAL LOW (ref 4.22–5.81)
RBC: 2.57 MIL/uL — ABNORMAL LOW (ref 4.22–5.81)
RDW: 15.9 % — ABNORMAL HIGH (ref 11.5–15.5)
RDW: 17.6 % — ABNORMAL HIGH (ref 11.5–15.5)
RDW: 18.2 % — ABNORMAL HIGH (ref 11.5–15.5)
WBC: 15.9 10*3/uL — ABNORMAL HIGH (ref 4.0–10.5)
WBC: 20.3 10*3/uL — ABNORMAL HIGH (ref 4.0–10.5)
WBC: 26 10*3/uL — ABNORMAL HIGH (ref 4.0–10.5)
nRBC: 0.5 % — ABNORMAL HIGH (ref 0.0–0.2)
nRBC: 0.6 % — ABNORMAL HIGH (ref 0.0–0.2)
nRBC: 0.8 % — ABNORMAL HIGH (ref 0.0–0.2)

## 2021-05-06 LAB — PROTIME-INR
INR: 1.3 — ABNORMAL HIGH (ref 0.8–1.2)
Prothrombin Time: 16.2 seconds — ABNORMAL HIGH (ref 11.4–15.2)

## 2021-05-06 LAB — BASIC METABOLIC PANEL
Anion gap: 7 (ref 5–15)
BUN: 17 mg/dL (ref 8–23)
CO2: 21 mmol/L — ABNORMAL LOW (ref 22–32)
Calcium: 7.9 mg/dL — ABNORMAL LOW (ref 8.9–10.3)
Chloride: 108 mmol/L (ref 98–111)
Creatinine, Ser: 1.15 mg/dL (ref 0.61–1.24)
GFR, Estimated: >60 mL/min (ref >60)
Glucose, Bld: 145 mg/dL — ABNORMAL HIGH (ref 70–99)
Potassium: 4 mmol/L (ref 3.5–5.1)
Sodium: 136 mmol/L (ref 135–145)

## 2021-05-06 LAB — MAGNESIUM: Magnesium: 1.9 mg/dL (ref 1.7–2.4)

## 2021-05-06 MED ORDER — SODIUM CHLORIDE 0.9% IV SOLUTION: Freq: Once | INTRAVENOUS | Status: AC

## 2021-05-06 MED ORDER — CHLORHEXIDINE GLUCONATE CLOTH 2 % EX PADS: 6.0000 | MEDICATED_PAD | Freq: Every day | CUTANEOUS | Status: DC

## 2021-05-06 MED ORDER — CHLORHEXIDINE GLUCONATE CLOTH 2 % EX PADS
6.0000 | MEDICATED_PAD | Freq: Every day | CUTANEOUS | Status: DC
  Administered 2021-05-07 – 2021-05-09 (×4): 6 via TOPICAL

## 2021-05-06 MED ORDER — TRAZODONE HCL 50 MG PO TABS
100.0000 mg | ORAL_TABLET | Freq: Every day | ORAL | Status: DC
  Administered 2021-05-06 – 2021-05-09 (×4): 100 mg via ORAL
  Filled 2021-05-06 (×4): qty 2

## 2021-05-06 MED ORDER — ALPRAZOLAM 0.5 MG PO TABS
0.5000 mg | ORAL_TABLET | Freq: Every evening | ORAL | Status: DC | PRN
  Filled 2021-05-06: qty 1

## 2021-05-06 NOTE — Progress Notes (Signed)
Piv attempted x2 to left forearm. Unsuccessful. Iv team consult ordered

## 2021-05-06 NOTE — Progress Notes (Signed)
eLink Physician-Brief Progress Note Patient Name: [name] DOB: 1956/08/13 MRN: [id number]   Date of Service  05/06/2021  HPI/Events of Note  Nursing reports 13 beat run of NSVT. Now in paced rhythm with rate = 70. BP = 108/49 with MAP = 66.  eICU Interventions  Plan: BMP and Mg++ level STAT.     Intervention Category Major Interventions: Arrhythmia - evaluation and management  Ester Mabe Eugene 05/06/2021, 3:04 AM

## 2021-05-06 NOTE — Progress Notes (Signed)
eLink Physician-Brief Progress Note Patient Name: [name] DOB: June 25, 1956 MRN: [id number]   Date of Service  05/06/2021  HPI/Events of Note  Anemia - Hgb - 6.6.   eICU Interventions  Will transfuse 1 unit PRBC now.     Intervention Category Major Interventions: Other:  Demon Volante Cornelia Copa 05/06/2021, 1:39 AM

## 2021-05-06 NOTE — Progress Notes (Signed)
NAME:  Zachary CAMARENA, MRN:  [id number], DOB:  1956-06-16, LOS: 1 ADMISSION DATE:  05/05/2021, CONSULTATION DATE:  05/06/21 REFERRING MD:  [place] - ED, CHIEF COMPLAINT:  weakness, dizziness   History of Present Illness:  65 year old man with  hypovolemic shock in the setting of GI bleed.    Patient is about 1 week history of left lower quadrant pain.  Intermittent.  Reports noted blood in the stool the last couple of days.  Intermittent and progressive lightheadedness, dizziness especially when standing from seated position.  Progressive weakness.  Called EMS this morning.  On arrival reportedly systolics in the 0000000.  Given fluids in route without significant improvement.  Additional 1.5 L given over time in the ED.  Intermittent increase in blood pressure although with decreased back down systolics to the [id number].  Hemoglobin in the fives from baseline 14 2 months ago.  Patient is on warfarin for A. fib.  No INR checked.  No vitamin K given.  No blood given at time of evaluation.  On exam, patient is sitting up and conversant.  Blood pressure noted to be progressively lower during time of evaluation.  GI consult pending.  Pertinent  Medical History  CHF EF 40% 12/2020, A. fib status post pacemaker on warfarin, PAD, chronic right lower extremity wound  Significant Hospital Events: Including procedures, antibiotic start and stop dates in addition to other pertinent events   7/22 brought to ED, hypotensive, intermittently better with fluids, hematochezia, admitted to [facility] for hypotension in the setting of GI bleed and shock requiring Levophed  Interim History / Subjective:   Remains critically ill, on 6 mics of Levophed. Last bloody bowel movement around 1 AM. Complains of mild abdominal pain, no chest pain or dyspnea Multiple runs of wide-complex QRS noted around 2:30 AM  Objective   Blood pressure (!) 114/55, pulse 71, temperature 98.1 F (36.7 C), temperature source Oral, resp. rate 13, SpO2 98  %.        Intake/Output Summary (Last 24 hours) at 05/06/2021 0806 Last data filed at 05/06/2021 0700 Gross per 24 hour  Intake 3924.61 ml  Output --  Net 3924.61 ml   There were no vitals filed for this visit.  Examination: General: Sitting in bed, no acute distress Lungs: Clear, no accessory muscle use Cardiovascular: Regular rate and rhythm, no murmur Abdomen: Nondistended, mild left lower quadrant pain, no guarding Extremities: No edema, wound right lateral lower leg Neuro: No weakness, sensation intact   Labs show increase in hemoglobin from 6.6-6.9 after 1 unit PRBC, decreasing leukocytosis, normal electrolytes, decrease in creatinine  CT abdomen/pelvis 7/22 colonic diverticulosis,?  Sigmoid diverticulitis resolving, bilateral renal calculi nonobstructive  Resolved Hospital Problem list   N/a  Assessment & Plan:  Hemorrhagic shock due to Hematochezia: Hypotensive, lactate elevated.  Remains on Levophed Acute blood loss anemia Coagulopathy-resolved  LLQ abdominal pain, possible diverticulitis. Endorses NSAID use, brisk ulcer bleed possible. On warfarin for Afib exacerbating bleeding.  -- 3 units PRBC so far, will give additional 2 units aim for hemoglobin 8 and above -Titrate Levophed to off as blood pressure improves -Ceftriaxone/Flagyl for diverticulitis  CHF with reduced EF: 40% 12/2020. Improved over time. Does not appear volume up. BNP WNL. ---Hold entresto, BB, torsemide  COPD: fixed obstruction PFT 2015 that resolved with bronchodilators (although no significant BD response per criteria). --duonebs PRN  Afib: s/p pacemaker, defib for CHF Runs of wide-complex QRS --hold digoxin --Hold warfarin (bleed) -Resume beta-blocker once off Levophed  DM: --  SSI  Best Practice (right click and "Reselect all SmartList Selections" daily)   Diet/type: NPO w/ oral meds DVT prophylaxis: SCD - GI bleed holding chemoprophylaxis GI prophylaxis: PPI Lines: N/A Foley:   N/A Code Status:  full code Last date of multidisciplinary goals of care discussion [n/a] Will remain in ICU while on Levophed and bleeding continues  Labs   CBC: Recent Labs  Lab 05/05/21 0801 05/05/21 1751 05/06/21 0028 05/06/21 0658  WBC 14.5* 31.3* 26.0* 20.3*  NEUTROABS 11.5*  --   --   --   HGB 5.6* 7.9* 6.6* 6.9*  HCT 17.4* 23.9* 19.1* 20.6*  MCV 105.5* 97.6 97.0 92.0  PLT 234 240 233 210     Basic Metabolic Panel: Recent Labs  Lab 05/05/21 0801 05/06/21 0605  NA 136 136  K 3.9 4.0  CL 108 108  CO2 21* 21*  GLUCOSE 212* 145*  BUN 21 17  CREATININE 1.45* 1.15  CALCIUM 8.2* 7.9*  MG 1.9 1.9    GFR: Estimated Creatinine Clearance: 80.3 mL/min (by C-G formula based on SCr of 1.15 mg/dL). Recent Labs  Lab 05/05/21 0801 05/05/21 1000 05/05/21 1751 05/06/21 0028 05/06/21 0658  WBC 14.5*  --  31.3* 26.0* 20.3*  LATICACIDVEN 3.9* 3.0*  --   --   --      Liver Function Tests: Recent Labs  Lab 05/05/21 0801  AST 17  ALT 15  ALKPHOS 37*  BILITOT 0.6  PROT 4.4*  ALBUMIN 2.6*    Recent Labs  Lab 05/05/21 0801  LIPASE 28    No results for input(s): AMMONIA in the last 168 hours.  ABG    Component Value Date/Time   HCO3 27.9 01/28/2017 1222   HCO3 29.1 (H) 01/28/2017 1222   TCO2 29 01/28/2017 1222   TCO2 31 01/28/2017 1222   ACIDBASEDEF 1.0 03/21/2016 1101   O2SAT 70.0 01/28/2017 1222   O2SAT 71.0 01/28/2017 1222      Coagulation Profile: Recent Labs  Lab 05/05/21 0801 05/06/21 0658  INR 8.4* 1.3*    Cardiac Enzymes: No results for input(s): CKTOTAL, CKMB, CKMBINDEX, TROPONINI in the last 168 hours.  HbA1C: Hgb A1c MFr Bld  Date/Time Value Ref Range Status  02/28/2021 03:43 PM 5.5 4.6 - 6.5 % Final    Comment:    Glycemic Control Guidelines for People with Diabetes:Non Diabetic:  <6%Goal of Therapy: <7%Additional Action Suggested:  >8%   09/16/2020 03:53 PM 5.6 4.6 - 6.5 % Final    Comment:    Glycemic Control Guidelines  for People with Diabetes:Non Diabetic:  <6%Goal of Therapy: <7%Additional Action Suggested:  >8%     CBG: Recent Labs  Lab 05/05/21 1520 05/05/21 1916 05/05/21 2312 05/06/21 0312 05/06/21 0725  GLUCAP 128* 130* 136* 121* 130*      CRITICAL CARE Performed by: Leanna Sato. Kelisha Dall   Total critical care time: 32 minutes  Critical care time was exclusive of separately billable procedures and treating other patients.  Critical care was necessary to treat or prevent imminent or life-threatening deterioration.  Critical care was time spent personally by me on the following activities: development of treatment plan with patient and/or surrogate as well as nursing, discussions with consultants, evaluation of patient's response to treatment, examination of patient, obtaining history from patient or surrogate, ordering and performing treatments and interventions, ordering and review of laboratory studies, ordering and review of radiographic studies, pulse oximetry and re-evaluation of patient's condition.    Kara Mead MD. Shade Flood. [facility] Pager : [phone]  If no response to pager , please call [phone] until 7 pm After 7:00 pm call Elink  [phone]   05/06/2021

## 2021-05-06 NOTE — Progress Notes (Signed)
Pt had multiple runs of wide complex QRS tachycardia. Pt asymptomatic. Pt with a pacemaker/defib and reports no shock delivered. [facility] provider called and notified. See new orders. Will continue to monitor closely.

## 2021-05-06 NOTE — Plan of Care (Signed)
  Problem: Education: Goal: Ability to identify signs and symptoms of gastrointestinal bleeding will improve Outcome: Progressing   Problem: Bowel/Gastric: Goal: Will show no signs and symptoms of gastrointestinal bleeding Outcome: Progressing   Problem: Fluid Volume: Goal: Will show no signs and symptoms of excessive bleeding Outcome: Progressing   Problem: Clinical Measurements: Goal: Complications related to the disease process, condition or treatment will be avoided or minimized Outcome: Progressing   Problem: Health Behavior/Discharge Planning: Goal: Ability to manage health-related needs will improve Outcome: Progressing   Problem: Clinical Measurements: Goal: Ability to maintain clinical measurements within normal limits will improve Outcome: Progressing Goal: Will remain free from infection Outcome: Progressing Goal: Diagnostic test results will improve Outcome: Progressing Goal: Cardiovascular complication will be avoided Outcome: Progressing   Problem: Elimination: Goal: Will not experience complications related to bowel motility Outcome: Progressing   Problem: Pain Managment: Goal: General experience of comfort will improve Outcome: Progressing

## 2021-05-06 NOTE — Progress Notes (Signed)
Progress Note   Subjective  Patient had some rectal bleeding overnight, last at 1 AM had a large movement, nothing since then. INR improved this AM. He feels and looks much better, asking for something to drink. He remains on low dose of pressor. He did have some short runs of wide complex tachycardia overnight. Hgb dropped from 7s to 6s, receiving more blood. WBC from 30s to 20s.    Objective   Vital signs in last 24 hours: Temp:  [97.3 F (36.3 C)-98.7 F (37.1 C)] 98.1 F (36.7 C) (07/23 0800) Pulse Rate:  [69-81] 69 (07/23 0800) Resp:  [9-27] 16 (07/23 0800) BP: (80-121)/(32-78) 115/53 (07/23 0800) SpO2:  [95 %-100 %] 97 % (07/23 0800) Weight:  [117.4 kg] 117.4 kg (07/23 0800) Last BM Date: 05/05/21 General:    white male in NAD Abdomen:  Soft, nontender and nondistended.  Extremities:  Without edema. Neurologic:  Alert and oriented,  grossly normal neurologically. Psych:  Cooperative. Normal mood and affect.  Intake/Output from previous day: 07/22 0701 - 07/23 0700 In: 3924.6 [P.O.:360; I.V.:717.8; BH:5220215; IV Piggyback:1371.8] Out: -  Intake/Output this shift: Total I/O In: -  Out: 100 [Urine:100]  Lab Results: Recent Labs    05/05/21 1751 05/06/21 0028 05/06/21 0658  WBC 31.3* 26.0* 20.3*  HGB 7.9* 6.6* 6.9*  HCT 23.9* 19.1* 20.6*  PLT 240 233 210   BMET Recent Labs    05/05/21 0801 05/06/21 0605  NA 136 136  K 3.9 4.0  CL 108 108  CO2 21* 21*  GLUCOSE 212* 145*  BUN 21 17  CREATININE 1.45* 1.15  CALCIUM 8.2* 7.9*   LFT Recent Labs    05/05/21 0801  PROT 4.4*  ALBUMIN 2.6*  AST 17  ALT 15  ALKPHOS 37*  BILITOT 0.6   PT/INR Recent Labs    05/05/21 0801 05/06/21 0658  LABPROT 69.4* 16.2*  INR 8.4* 1.3*    Studies/Results: DG Chest Port 1 View  Result Date: 05/05/2021 CLINICAL DATA:  Sepsis and CHF EXAM: PORTABLE CHEST 1 VIEW COMPARISON:  12/22/2019 FINDINGS: Cardiomegaly and vascular pedicle widening. Biventricular  ICD/pacer from the left. Small pleural effusions. No pulmonary edema or air bronchogram. IMPRESSION: Cardiomegaly and small pleural effusions. Electronically Signed   By: Monte Fantasia M.D.   On: 05/05/2021 08:43   CT Angio Abd/Pel W and/or Wo Contrast  Result Date: 05/05/2021 CLINICAL DATA:  Nausea, vomiting, diarrhea, GI bleed EXAM: CTA ABDOMEN AND PELVIS WITHOUT AND WITH CONTRAST TECHNIQUE: Multidetector CT imaging of the abdomen and pelvis was performed using the standard protocol during bolus administration of intravenous contrast. Multiplanar reconstructed images and MIPs were obtained and reviewed to evaluate the vascular anatomy. CONTRAST:  125m OMNIPAQUE IOHEXOL 350 MG/ML SOLN COMPARISON:  CTA chest 12/09/2019, and previous FINDINGS: VASCULAR Coronary calcifications. Transvenous pacing leads partially visualized. Aorta: Mild calcified plaque in the infrarenal segment. No aneurysm, dissection, or stenosis. Celiac: Patent without evidence of aneurysm, dissection, vasculitis or significant stenosis. SMA: Replaced right hepatic arterial supply, an anatomic variant. No aneurysm, dissection, or stenosis. Renals: Both renal arteries are patent without evidence of aneurysm, dissection, vasculitis, fibromuscular dysplasia or significant stenosis. IMA: Patent without evidence of aneurysm, dissection, vasculitis or significant stenosis. Inflow: Scattered calcified plaque without aneurysm, dissection, or stenosis. Proximal Outflow: Atheromatous, patent. Veins: Patent hepatic veins, portal vein, SMV, splenic vein, bilateral renal veins, iliac venous system and IVC. No venous pathology identified. Review of the MIP images confirms the above findings. NON-VASCULAR Lower  chest: No pleural or pericardial effusion. Minimal linear scarring posteriorly at the right lung base. Hepatobiliary: No focal liver abnormality is seen. No gallstones, gallbladder wall thickening, or biliary dilatation. Pancreas: Unremarkable. No  pancreatic ductal dilatation or surrounding inflammatory changes. Spleen: Normal in size without focal abnormality. Adrenals/Urinary Tract: Normal adrenal glands. Bilateral nephrolithiasis measuring up to 22m left, 7 mm right. No hydronephrosis. 3.3 cm cyst from the left lower pole. Urinary bladder physiologically distended. Stomach/Bowel: Stomach is physiologically distended, unremarkable. The small bowel is nondilated. Appendix contains appendicoliths, without dilatation or inflammatory change. The colon is nondilated, with scattered diverticula. Mild inflammatory changes around the proximal sigmoid segment. No evidence of abscess. No evidence of active hemorrhage into the lumen of the bowel. Lymphatic: No abdominal or pelvic adenopathy. Reproductive: Mild prostate enlargement with central coarse calcifications. Other: No ascites.  No free air. Musculoskeletal: Mild lumbar scoliosis with multilevel spondylitic change. No fracture or worrisome bone lesion. IMPRESSION: 1. No active extravasation into the bowel. 2. Colonic diverticulosis with questionable mild or resolving proximal sigmoid diverticulitis, no abscess. 3. Nonobstructive bilateral nephrolithiasis 4. Coronary and Aortic Atherosclerosis (ICD10-170.0). Electronically Signed   By: DLucrezia EuropeM.D.   On: 05/05/2021 11:51       Assessment / Plan:    65y/o male on coumadin for AF, CAD, CHF, pacemaker, admitted with abdominal pain, rectal bleeding, severe anemia, supratherpeutic INR of 8s. CTA shows suspected diverticulitis as likely cause of his pain, but no active extravasation at the time of that exam, but suspect he more than likely had a diverticular bleed. He uses NSAIDs quite a bit and at risk for PUD but BUN is normal, brisk upper GI bleed sees less likely.  He likely has lower GI bleed, suspect stuttering diverticular bleed, hopefully resolves on its own with more time now that INR has normalized. If he has recurrence of significant bleeding  consider repeat CTA or tagged RBC scan, with possible IR embolization. Hope to avoid colonoscopy in the setting of possible diverticulitis and runs of Vtach overnight. His pain seems improved. Will allow clear liquid diet today, monitor his course and response to RBC transfusion, antibiotics, supportive care.  Recommend: - clear liquid diet - agree with RBC transfusion, monitor response, trend Hgb - if recurrent significant bleeding please contact me, would repeat CTA or obtained tagged RBC scan, IR consult - continue empiric PPI but as above upper GI bleed seems much less likely right now - antibiotics for diverticulitis - trend INR - if bleeding resolves with supportive measures will plan for outpatient colonoscopy once he recovers from this  Will follow, call with questions.  SJolly Mango MD LThe [facility]

## 2021-05-06 NOTE — Progress Notes (Signed)
eLink Physician-Brief Progress Note Patient Name: [name] DOB: May 12, 1956 MRN: [id number]   Date of Service  05/06/2021  HPI/Events of Note  Patient's Hgb after 2u pRBC is 7.8. Remains hypotensive requiring levophed and has ongoing GI bleeding. Cardiac hx and wide complex rhythm in last 24 hrs in setting of low Hgb so goal is currently Hgb  > 8.   eICU Interventions  Ordered an additional 1u pRBCs.     Intervention Category Intermediate Interventions: Bleeding - evaluation and treatment with blood products  Marily Lente Leyla Soliz 05/06/2021, 8:20 PM

## 2021-05-07 DIAGNOSIS — D649 Anemia, unspecified: Secondary | ICD-10-CM | POA: Diagnosis not present

## 2021-05-07 DIAGNOSIS — K922 Gastrointestinal hemorrhage, unspecified: Secondary | ICD-10-CM | POA: Diagnosis not present

## 2021-05-07 DIAGNOSIS — R578 Other shock: Secondary | ICD-10-CM | POA: Diagnosis not present

## 2021-05-07 DIAGNOSIS — K5732 Diverticulitis of large intestine without perforation or abscess without bleeding: Secondary | ICD-10-CM | POA: Diagnosis not present

## 2021-05-07 LAB — BPAM RBC
Blood Product Expiration Date: 202208172359
Blood Product Expiration Date: 202208172359
Blood Product Expiration Date: 202208192359
Blood Product Expiration Date: 202208192359
Blood Product Expiration Date: 202208192359
Blood Product Expiration Date: 202208222359
ISSUE DATE / TIME: 202207221112
ISSUE DATE / TIME: 202207221348
ISSUE DATE / TIME: 202207230147
ISSUE DATE / TIME: 202207230935
ISSUE DATE / TIME: 202207231209
ISSUE DATE / TIME: 202207232036
Unit Type and Rh: 6200
Unit Type and Rh: 6200
Unit Type and Rh: 6200
Unit Type and Rh: 6200
Unit Type and Rh: 6200
Unit Type and Rh: 6200

## 2021-05-07 LAB — TYPE AND SCREEN
ABO/RH(D): A POS
Antibody Screen: NEGATIVE
Unit division: 0
Unit division: 0
Unit division: 0
Unit division: 0
Unit division: 0
Unit division: 0

## 2021-05-07 LAB — CBC
HCT: 25.9 % — ABNORMAL LOW (ref 39.0–52.0)
Hemoglobin: 8.5 g/dL — ABNORMAL LOW (ref 13.0–17.0)
MCH: 30 pg (ref 26.0–34.0)
MCHC: 32.8 g/dL (ref 30.0–36.0)
MCV: 91.5 fL (ref 80.0–100.0)
Platelets: 175 10*3/uL (ref 150–400)
RBC: 2.83 MIL/uL — ABNORMAL LOW (ref 4.22–5.81)
RDW: 18.3 % — ABNORMAL HIGH (ref 11.5–15.5)
WBC: 11.6 10*3/uL — ABNORMAL HIGH (ref 4.0–10.5)
nRBC: 0.9 % — ABNORMAL HIGH (ref 0.0–0.2)

## 2021-05-07 LAB — MAGNESIUM: Magnesium: 2.1 mg/dL (ref 1.7–2.4)

## 2021-05-07 LAB — BASIC METABOLIC PANEL
Anion gap: 5 (ref 5–15)
Anion gap: 11 (ref 5–15)
BUN: 9 mg/dL (ref 8–23)
BUN: 8 mg/dL (ref 8–23)
CO2: 21 mmol/L — ABNORMAL LOW (ref 22–32)
CO2: 21 mmol/L — ABNORMAL LOW (ref 22–32)
Calcium: 8 mg/dL — ABNORMAL LOW (ref 8.9–10.3)
Calcium: 8 mg/dL — ABNORMAL LOW (ref 8.9–10.3)
Chloride: 111 mmol/L (ref 98–111)
Chloride: 106 mmol/L (ref 98–111)
Creatinine, Ser: 0.82 mg/dL (ref 0.61–1.24)
Creatinine, Ser: 0.75 mg/dL (ref 0.61–1.24)
GFR, Estimated: >60 mL/min (ref >60)
GFR, Estimated: >60 mL/min (ref >60)
Glucose, Bld: 99 mg/dL (ref 70–99)
Glucose, Bld: 89 mg/dL (ref 70–99)
Potassium: 3.5 mmol/L (ref 3.5–5.1)
Potassium: 3.5 mmol/L (ref 3.5–5.1)
Sodium: 137 mmol/L (ref 135–145)
Sodium: 138 mmol/L (ref 135–145)

## 2021-05-07 LAB — GLUCOSE, CAPILLARY: Glucose-Capillary: 86 mg/dL (ref 70–99)

## 2021-05-07 LAB — PROTIME-INR
INR: >10 (ref 0.8–1.2)
INR: 1.1 (ref 0.8–1.2)
Prothrombin Time: >90 seconds — ABNORMAL HIGH (ref 11.4–15.2)
Prothrombin Time: 14.6 seconds (ref 11.4–15.2)

## 2021-05-07 LAB — PHOSPHORUS
Phosphorus: 1.4 mg/dL — ABNORMAL LOW (ref 2.5–4.6)
Phosphorus: 3 mg/dL (ref 2.5–4.6)

## 2021-05-07 MED ORDER — VENLAFAXINE HCL ER 75 MG PO CP24
75.0000 mg | ORAL_CAPSULE | Freq: Every day | ORAL | Status: DC
  Administered 2021-05-07 – 2021-05-10 (×4): 75 mg via ORAL
  Filled 2021-05-07 (×5): qty 1

## 2021-05-07 MED ORDER — POTASSIUM PHOSPHATES 15 MMOLE/5ML IV SOLN
45.0000 mmol | Freq: Once | INTRAVENOUS | Status: AC
  Administered 2021-05-07: 45 mmol via INTRAVENOUS
  Filled 2021-05-07: qty 15

## 2021-05-07 MED ORDER — ALPRAZOLAM 0.5 MG PO TABS
1.0000 mg | ORAL_TABLET | Freq: Every evening | ORAL | Status: DC | PRN
  Administered 2021-05-07 – 2021-05-09 (×3): 1 mg via ORAL
  Filled 2021-05-07 (×3): qty 2

## 2021-05-07 MED ORDER — ZOLPIDEM TARTRATE 5 MG PO TABS
5.0000 mg | ORAL_TABLET | Freq: Every evening | ORAL | Status: DC | PRN
  Administered 2021-05-07 – 2021-05-09 (×3): 5 mg via ORAL
  Filled 2021-05-07 (×3): qty 1

## 2021-05-07 MED ORDER — CARISOPRODOL 350 MG PO TABS
350.0000 mg | ORAL_TABLET | Freq: Three times a day (TID) | ORAL | Status: DC | PRN
  Administered 2021-05-07 – 2021-05-09 (×6): 350 mg via ORAL
  Filled 2021-05-07 (×6): qty 1

## 2021-05-07 MED ORDER — TRAMADOL HCL 50 MG PO TABS
50.0000 mg | ORAL_TABLET | Freq: Every day | ORAL | Status: DC | PRN
  Administered 2021-05-07 – 2021-05-10 (×4): 50 mg via ORAL
  Filled 2021-05-07 (×4): qty 1

## 2021-05-07 MED ORDER — SODIUM CHLORIDE 0.9% FLUSH: 10.0000 mL | INTRAVENOUS | Status: DC | PRN

## 2021-05-07 MED ORDER — SODIUM CHLORIDE 0.9% FLUSH
10.0000 mL | Freq: Two times a day (BID) | INTRAVENOUS | Status: DC
  Administered 2021-05-07 – 2021-05-10 (×7): 10 mL

## 2021-05-07 NOTE — Progress Notes (Signed)
eLink Physician-Brief Progress Note Patient Name: [name] DOB: 02/18/1956 MRN: [id number]   Date of Service  05/07/2021  HPI/Events of Note  Received request for PRN pain medication as patient is c/o back pain. At home, he takes tramadol '50mg'$  daily PRN.   eICU Interventions  Ordered patient's home tramadol.     Intervention Category Intermediate Interventions: Pain - evaluation and management  Marily Lente Gracielynn Birkel 05/07/2021, 4:59 AM

## 2021-05-07 NOTE — Progress Notes (Signed)
Progress Note   Subjective  Patient unfortunately had some recurrent bleeding last night and a few small episodes this AM, passing red blood per rectum. Pain has improved. Getting another unit of RBC transfusion this AM. Otherwise feels about the same.   Objective   Vital signs in last 24 hours: Temp:  [97.6 F (36.4 C)-98.6 F (37 C)] 97.7 F (36.5 C) (07/24 0337) Pulse Rate:  [69-79] 70 (07/24 0800) Resp:  [13-29] 18 (07/24 0800) BP: (77-138)/(45-84) 77/58 (07/24 0800) SpO2:  [77 %-100 %] 100 % (07/24 0800) Last BM Date: 05/07/21 General:    white male in NAD Abdomen:  Soft, nontender and nondistended.  Neurologic:  Alert and oriented,  grossly normal neurologically. Psych:  Cooperative. Normal mood and affect.  Intake/Output from previous day: 07/23 0701 - 07/24 0700 In: 1543.6 [I.V.:416.7; Blood:893; IV Piggyback:233.9] Out: 1226 [Urine:1225; Stool:1] Intake/Output this shift: Total I/O In: 100 [P.O.:100] Out: -   Lab Results: Recent Labs    05/06/21 0028 05/06/21 0658 05/06/21 1826  WBC 26.0* 20.3* 15.9*  HGB 6.6* 6.9* 7.8*  HCT 19.1* 20.6* 23.5*  PLT 233 210 PLATELET CLUMPS NOTED ON SMEAR, UNABLE TO ESTIMATE   BMET Recent Labs    05/05/21 0801 05/06/21 0605 05/07/21 0944  NA 136 136 137  K 3.9 4.0 3.5  CL 108 108 111  CO2 21* 21* 21*  GLUCOSE 212* 145* 99  BUN '21 17 9  '$ CREATININE 1.45* 1.15 0.82  CALCIUM 8.2* 7.9* 8.0*   LFT Recent Labs    05/05/21 0801  PROT 4.4*  ALBUMIN 2.6*  AST 17  ALT 15  ALKPHOS 37*  BILITOT 0.6   PT/INR Recent Labs    05/05/21 0801 05/06/21 0658  LABPROT 69.4* 16.2*  INR 8.4* 1.3*    Studies/Results: CT Angio Abd/Pel W and/or Wo Contrast  Result Date: 05/05/2021 CLINICAL DATA:  Nausea, vomiting, diarrhea, GI bleed EXAM: CTA ABDOMEN AND PELVIS WITHOUT AND WITH CONTRAST TECHNIQUE: Multidetector CT imaging of the abdomen and pelvis was performed using the standard protocol during bolus administration  of intravenous contrast. Multiplanar reconstructed images and MIPs were obtained and reviewed to evaluate the vascular anatomy. CONTRAST:  183m OMNIPAQUE IOHEXOL 350 MG/ML SOLN COMPARISON:  CTA chest 12/09/2019, and previous FINDINGS: VASCULAR Coronary calcifications. Transvenous pacing leads partially visualized. Aorta: Mild calcified plaque in the infrarenal segment. No aneurysm, dissection, or stenosis. Celiac: Patent without evidence of aneurysm, dissection, vasculitis or significant stenosis. SMA: Replaced right hepatic arterial supply, an anatomic variant. No aneurysm, dissection, or stenosis. Renals: Both renal arteries are patent without evidence of aneurysm, dissection, vasculitis, fibromuscular dysplasia or significant stenosis. IMA: Patent without evidence of aneurysm, dissection, vasculitis or significant stenosis. Inflow: Scattered calcified plaque without aneurysm, dissection, or stenosis. Proximal Outflow: Atheromatous, patent. Veins: Patent hepatic veins, portal vein, SMV, splenic vein, bilateral renal veins, iliac venous system and IVC. No venous pathology identified. Review of the MIP images confirms the above findings. NON-VASCULAR Lower chest: No pleural or pericardial effusion. Minimal linear scarring posteriorly at the right lung base. Hepatobiliary: No focal liver abnormality is seen. No gallstones, gallbladder wall thickening, or biliary dilatation. Pancreas: Unremarkable. No pancreatic ductal dilatation or surrounding inflammatory changes. Spleen: Normal in size without focal abnormality. Adrenals/Urinary Tract: Normal adrenal glands. Bilateral nephrolithiasis measuring up to 772mleft, 7 mm right. No hydronephrosis. 3.3 cm cyst from the left lower pole. Urinary bladder physiologically distended. Stomach/Bowel: Stomach is physiologically distended, unremarkable. The small bowel is nondilated. Appendix contains  appendicoliths, without dilatation or inflammatory change. The colon is  nondilated, with scattered diverticula. Mild inflammatory changes around the proximal sigmoid segment. No evidence of abscess. No evidence of active hemorrhage into the lumen of the bowel. Lymphatic: No abdominal or pelvic adenopathy. Reproductive: Mild prostate enlargement with central coarse calcifications. Other: No ascites.  No free air. Musculoskeletal: Mild lumbar scoliosis with multilevel spondylitic change. No fracture or worrisome bone lesion. IMPRESSION: 1. No active extravasation into the bowel. 2. Colonic diverticulosis with questionable mild or resolving proximal sigmoid diverticulitis, no abscess. 3. Nonobstructive bilateral nephrolithiasis 4. Coronary and Aortic Atherosclerosis (ICD10-170.0). Electronically Signed   By: Lucrezia Europe M.D.   On: 05/05/2021 11:51       Assessment / Plan:    65 y/o male on coumadin for AF, CAD, CHF, pacemaker, admitted with abdominal pain, rectal bleeding, severe anemia, supratherpeutic INR of 8s. CTA shows suspected sigmoid diverticulitis as likely cause of his pain, but no active extravasation at the time of that exam. Suspect he more than likely has had diverticular bleeding. He uses NSAIDs quite a bit and at risk for PUD but BUN was at baseline and remained normal, brisk upper GI bleed unlikely.   He had a few episodes of small volume red blood per rectum overnight and this AM, getting another unit RBC now. Pending response, and his course this AM, if continued low grade bleeding recommend a tagged RBC scan to help localize (will be more sensitive for active bleeding than CTA). If he has active bleeding noted, would recommend IR consult for possible embolization. Hopefully with more time this can resolve on its own. His pain is significantly improved on antibiotics and WBC has come down nicely.    Recommend: - tagged RBC scan if additional low grade bleeding today and Hgb fails to respond to transfusion - clear liquid diet - if recurrent bleeding that is  significant, CTA would be the better test - continue empiric PPI but as above upper GI bleed seems much less likely right now - antibiotics for diverticulitis - trend INR - if bleeding resolves with supportive measures will plan for outpatient colonoscopy once he recovers from this. Hoping to avoid that now in the setting suspected diverticulitis.   Will follow, call with questions. Dr. Lyndel Safe to assume his GI care tomorrow.  Jolly Mango, MD [facility]

## 2021-05-07 NOTE — Progress Notes (Signed)
Pt has poor vasculature and has had 3 attempts via phlebotomy and RN to obtain labs this AM unsuccessfully. At this time pt refusing lab sticks and CBG sticks. Since pt CBG has been acceptable previously will hold 0800 insulin dose, last CBG at 0400 86. Breakfast available at bedside, no s/s hypoglycemia.   Pt requesting initiation of home meds including effexor, soma, and ambien. Most concerned about effexor as he "has been taking this for 20 years" and is concerned about withdrawal and worsening depression and anxiety.

## 2021-05-07 NOTE — Consult Note (Signed)
WOC Nurse Consult Note: Reason for Consult:Right anterior LE chronic, healing ulceration. Patient is followed by the Outpatient Wound Care center by Dr. Heber Marshall, who last saw him on 7/18.  I will implement her POC with minor adaptations as some of the products carried in the outpatient clinic are not available (on formulary) in the acute care setting. Patient to return to the POC and the oversight of Dr. Heber  post discharge. Wound type: venous insufficiency, lymphedema Pressure Injury POA: N/A Measurement: 0.7cm x 2cm x 0.1cm Wound [id number], dry Drainage (amount, consistency, odor) none Periwound:cracked dry Dressing procedure/placement/frequency: I will provide Nursing with guidance for the topical care of this area. Hydrofera blue is not available in house.  We will cleanse the leg daily with soap and water, rinse and pat dry. Apply Sween 24 moisturizing cream and top with lesion with xeroform gauze and a silicone foam. The LE will be wrapped from just below toes to just below knee with Kerlix dry gauze and topped with an ACE bandage wrapped in a similar manner. Wound care will be performed daily.   [place] nursing team will not follow, but will remain available to this patient, the nursing and medical teams.  Please re-consult if needed. Thanks, Maudie Flakes, MSN, RN, [place], Arther Abbott  Pager# [phone]

## 2021-05-07 NOTE — Progress Notes (Signed)
NAME:  [name], MRN:  [id number], DOB:  06-24-1956, LOS: 2 ADMISSION DATE:  05/05/2021, CONSULTATION DATE:  05/07/21 REFERRING MD:  [place] - ED, CHIEF COMPLAINT:  weakness, dizziness   History of Present Illness:  65 year old man with  hypovolemic shock in the setting of GI bleed.    Patient is about 1 week history of left lower quadrant pain.  Intermittent.  Reports noted blood in the stool the last couple of days.  Intermittent and progressive lightheadedness, dizziness especially when standing from seated position.  Progressive weakness.  Called EMS this morning.  On arrival reportedly systolics in the 0000000.  Given fluids in route without significant improvement.  Additional 1.5 L given over time in the ED.  Intermittent increase in blood pressure although with decreased back down systolics to the [id number].  Hemoglobin in the fives from baseline 14 2 months ago.  Patient is on warfarin for A. fib.  No INR checked.  No vitamin K given.  No blood given at time of evaluation.  On exam, patient is sitting up and conversant.  Blood pressure noted to be progressively lower during time of evaluation.  GI consult pending.  Pertinent  Medical History  CHF EF 40% 12/2020, A. fib status post pacemaker on warfarin, PAD, chronic right lower extremity wound  Significant Hospital Events: Including procedures, antibiotic start and stop dates in addition to other pertinent events   7/22 brought to ED, hypotensive, intermittently better with fluids, hematochezia, admitted to [facility] for hypotension in the setting of GI bleed and shock requiring Levophed 7/23 on 6 mics of Levophed.Multiple runs of wide-complex QRS noted around 2:30 AM  Interim History / Subjective:   Off Levophed Out of bed to chair. Did not sleep overnight. Reports 3-4 bloody stools/24 hours   Objective   Blood pressure (!) 77/58, pulse 70, temperature 97.7 F (36.5 C), temperature source Oral, resp. rate 18, height '5\' 10"'$  (1.778 m), weight  117.4 kg, SpO2 100 %.        Intake/Output Summary (Last 24 hours) at 05/07/2021 1010 Last data filed at 05/07/2021 0800 Gross per 24 hour  Intake 1561.03 ml  Output 776 ml  Net 785.03 ml    Filed Weights   05/06/21 0800  Weight: 117.4 kg    Examination: General: Sitting in chair, no distress Lungs: No accessory muscle use, clear lungs Cardiovascular: Regular rate and rhythm, no murmur Abdomen: No distention, no tenderness or guarding Extremities: No edema, wound right lateral lower leg Neuro: No weakness, sensation intact   Labs show hemoglobin increased from 6.9-7.8 after 2 units PRBC  CT abdomen/pelvis 7/22 colonic diverticulosis,?  Sigmoid diverticulitis resolving, bilateral renal calculi nonobstructive  Resolved Hospital Problem list   N/a  Assessment & Plan:  Hemorrhagic shock due to Hematochezia: Off Levophed Acute blood loss anemia Coagulopathy-resolved  LLQ abdominal pain with leukocytosis, likely diverticulitis. Endorses NSAID use, brisk ulcer bleed less likely.  Was on warfarin for Afib   -- 5 units PRBC so far, await CBC from this a.m. -Ceftriaxone/Flagyl for diverticulitis -If evidence of further bleeding, options include nuclear scan versus repeat CT angiogram  CHF with reduced EF: 40% 12/2020. Improved over time. Does not appear volume up. BNP WNL. ---Hold entresto, BB, torsemide given soft blood pressure  COPD: fixed obstruction PFT 2015 that resolved with bronchodilators (although no significant BD response per criteria). --duonebs PRN  Afib: s/p pacemaker, defib for CHF Runs of wide-complex QRS --hold digoxin --Hold warfarin (bleed) -Resume beta-blocker once blood  pressure improves, now that off Levophed  DM: -- CBGs appear controlled we will DC monitoring and SSI  Insomnia Resume home meds including trazodone, Soma, Xanax and Ambien  Best Practice (right click and "Reselect all SmartList Selections" daily)   Diet/type: NPO w/ oral  meds DVT prophylaxis: SCD - GI bleed holding chemoprophylaxis GI prophylaxis: PPI Lines: N/A Foley:  N/A Code Status:  full code Last date of multidisciplinary goals of care discussion [n/a] Will remain in ICU while bleeding continues  Labs   CBC: Recent Labs  Lab 05/05/21 0801 05/05/21 1751 05/06/21 0028 05/06/21 0658 05/06/21 1826  WBC 14.5* 31.3* 26.0* 20.3* 15.9*  NEUTROABS 11.5*  --   --   --   --   HGB 5.6* 7.9* 6.6* 6.9* 7.8*  HCT 17.4* 23.9* 19.1* 20.6* 23.5*  MCV 105.5* 97.6 97.0 92.0 91.4  PLT 234 240 233 210 PLATELET CLUMPS NOTED ON SMEAR, UNABLE TO ESTIMATE     Basic Metabolic Panel: Recent Labs  Lab 05/05/21 0801 05/06/21 0605  NA 136 136  K 3.9 4.0  CL 108 108  CO2 21* 21*  GLUCOSE 212* 145*  BUN 21 17  CREATININE 1.45* 1.15  CALCIUM 8.2* 7.9*  MG 1.9 1.9    GFR: Estimated Creatinine Clearance: 81.1 mL/min (by C-G formula based on SCr of 1.15 mg/dL). Recent Labs  Lab 05/05/21 0801 05/05/21 1000 05/05/21 1751 05/06/21 0028 05/06/21 0658 05/06/21 1826  WBC 14.5*  --  31.3* 26.0* 20.3* 15.9*  LATICACIDVEN 3.9* 3.0*  --   --   --   --      Liver Function Tests: Recent Labs  Lab 05/05/21 0801  AST 17  ALT 15  ALKPHOS 37*  BILITOT 0.6  PROT 4.4*  ALBUMIN 2.6*    Recent Labs  Lab 05/05/21 0801  LIPASE 28    No results for input(s): AMMONIA in the last 168 hours.  ABG    Component Value Date/Time   HCO3 27.9 01/28/2017 1222   HCO3 29.1 (H) 01/28/2017 1222   TCO2 29 01/28/2017 1222   TCO2 31 01/28/2017 1222   ACIDBASEDEF 1.0 03/21/2016 1101   O2SAT 70.0 01/28/2017 1222   O2SAT 71.0 01/28/2017 1222      Coagulation Profile: Recent Labs  Lab 05/05/21 0801 05/06/21 0658  INR 8.4* 1.3*     Cardiac Enzymes: No results for input(s): CKTOTAL, CKMB, CKMBINDEX, TROPONINI in the last 168 hours.  HbA1C: Hgb A1c MFr Bld  Date/Time Value Ref Range Status  02/28/2021 03:43 PM 5.5 4.6 - 6.5 % Final    Comment:     Glycemic Control Guidelines for People with Diabetes:Non Diabetic:  <6%Goal of Therapy: <7%Additional Action Suggested:  >8%   09/16/2020 03:53 PM 5.6 4.6 - 6.5 % Final    Comment:    Glycemic Control Guidelines for People with Diabetes:Non Diabetic:  <6%Goal of Therapy: <7%Additional Action Suggested:  >8%     CBG: Recent Labs  Lab 05/06/21 1105 05/06/21 1522 05/06/21 1932 05/06/21 2259 05/07/21 0334  GLUCAP 166* 113* 125* 119* 86         Kara Mead MD. FCCP. [facility] Pager : [phone]  If no response to pager , please call [phone] until 7 pm After 7:00 pm call Elink  [phone]   05/07/2021

## 2021-05-08 ENCOUNTER — Encounter (HOSPITAL_BASED_OUTPATIENT_CLINIC_OR_DEPARTMENT_OTHER): Payer: Medicare HMO | Admitting: Internal Medicine

## 2021-05-08 DIAGNOSIS — R1110 Vomiting, unspecified: Secondary | ICD-10-CM | POA: Diagnosis not present

## 2021-05-08 DIAGNOSIS — I959 Hypotension, unspecified: Secondary | ICD-10-CM

## 2021-05-08 DIAGNOSIS — R197 Diarrhea, unspecified: Secondary | ICD-10-CM

## 2021-05-08 DIAGNOSIS — K922 Gastrointestinal hemorrhage, unspecified: Secondary | ICD-10-CM | POA: Diagnosis not present

## 2021-05-08 LAB — BASIC METABOLIC PANEL
Anion gap: 4 — ABNORMAL LOW (ref 5–15)
BUN: 6 mg/dL — ABNORMAL LOW (ref 8–23)
CO2: 24 mmol/L (ref 22–32)
Calcium: 7.8 mg/dL — ABNORMAL LOW (ref 8.9–10.3)
Chloride: 111 mmol/L (ref 98–111)
Creatinine, Ser: 0.74 mg/dL (ref 0.61–1.24)
GFR, Estimated: >60 mL/min (ref >60)
Glucose, Bld: 90 mg/dL (ref 70–99)
Potassium: 3.5 mmol/L (ref 3.5–5.1)
Sodium: 139 mmol/L (ref 135–145)

## 2021-05-08 LAB — CBC
HCT: 25.9 % — ABNORMAL LOW (ref 39.0–52.0)
Hemoglobin: 8.5 g/dL — ABNORMAL LOW (ref 13.0–17.0)
MCH: 30.4 pg (ref 26.0–34.0)
MCHC: 32.8 g/dL (ref 30.0–36.0)
MCV: 92.5 fL (ref 80.0–100.0)
Platelets: 153 10*3/uL (ref 150–400)
RBC: 2.8 MIL/uL — ABNORMAL LOW (ref 4.22–5.81)
RDW: 18 % — ABNORMAL HIGH (ref 11.5–15.5)
WBC: 7.2 10*3/uL (ref 4.0–10.5)
nRBC: 1 % — ABNORMAL HIGH (ref 0.0–0.2)

## 2021-05-08 LAB — PROTIME-INR
INR: 1.1 (ref 0.8–1.2)
Prothrombin Time: 14.5 seconds (ref 11.4–15.2)

## 2021-05-08 MED ORDER — POTASSIUM CHLORIDE CRYS ER 20 MEQ PO TBCR
40.0000 meq | EXTENDED_RELEASE_TABLET | Freq: Once | ORAL | Status: AC
  Administered 2021-05-08: 40 meq via ORAL
  Filled 2021-05-08: qty 2

## 2021-05-08 NOTE — Progress Notes (Signed)
Pharmacy Electrolyte Replacement  Recent Labs:  Recent Labs    05/07/21 0944 05/07/21 2028 05/08/21 0629  K 3.5 3.5 3.5  MG 2.1  --   --   PHOS 1.4* 3.0  --   CREATININE 0.82 0.75 0.74    Low Critical Values (K </= 2.5, Phos </= 1, Mg </= 1) Present: None  MD Contacted: n/a as no critical values  Plan: KCl 77mq po x1 per protocol. Repeat in AM.  *Afib - goal K >=4   JSloan Leiter PharmD, BCPS, BCCCP Clinical Pharmacist Please refer to AG[facility] Partners LLCfor MTaylornumbers 05/08/2021, 7:56 AM

## 2021-05-08 NOTE — Progress Notes (Signed)
NAME:  [name], MRN:  [id number], DOB:  1956/02/27, LOS: 3 ADMISSION DATE:  05/05/2021, CONSULTATION DATE:  7/24 REFERRING MD:  Almyra Free, CHIEF COMPLAINT:  Weakness, dizziness   History of Present Illness:  65 y/o male admitted with hemorrhagic shock from a GI bleed.    Pertinent  Medical History  CHF EF 40% 12/2020, A. fib status post pacemaker on warfarin, PAD, chronic right lower extremity wound  Significant Hospital Events: Including procedures, antibiotic start and stop dates in addition to other pertinent events   7/22 brought to ED, hypotensive, intermittently better with fluids, hematochezia, admitted to [facility] for hypotension in the setting of GI bleed and shock requiring Levophed 7/23 on 6 mics of Levophed.Multiple runs of wide-complex QRS noted around 2:30 AMH  Interim History / Subjective:  Small amount of blood in his stool this morning Otherwise clear  Objective   Blood pressure 99/70, pulse 69, temperature 97.9 F (36.6 C), temperature source Oral, resp. rate 20, height '5\' 10"'$  (1.778 m), weight 118.8 kg, SpO2 98 %.        Intake/Output Summary (Last 24 hours) at 05/08/2021 0805 Last data filed at 05/08/2021 0500 Gross per 24 hour  Intake 910.09 ml  Output 250 ml  Net 660.09 ml   Filed Weights   05/06/21 0800 05/08/21 0500  Weight: 117.4 kg 118.8 kg    Examination:  General:  Resting comfortably in chair HENT: NCAT OP clear PULM: CTA B, normal effort CV: RRR, no mgr GI: BS+, soft, nontender MSK: normal bulk and tone Neuro: awake, alert, no distress, MAEW   Resolved Hospital Problem list     Assessment & Plan:  Hemorrhagic shock from hematochezeia Acute blood loss anemia Coagulopathy LLQ abdominal pain with leukocytosis, likely diverticulitis F/u GI recommendations Monitor for bleeding Transfuse PRBC for Hgb < 7 gm/dL  Diverticulitis Ceftriaxone and flagyl> duration per GI  CHF with reducted EF (40%) Monitor volume status Hold  anti-hypertensives for now  COPD, moderate Albuterol prn dyspnea  Atrial fibrillation Tele Monitor HR  DM2 SSI  Insomnia Ambien qHS  Best Practice (right click and "Reselect all SmartList Selections" daily)   Diet/type: Regular consistency (see orders) DVT prophylaxis: SCD GI prophylaxis: PPI Lines: N/A Foley:  N/A Code Status:  full code Last date of multidisciplinary goals of care discussion [n/a]  Labs   CBC: Recent Labs  Lab 05/05/21 0801 05/05/21 1751 05/06/21 0028 05/06/21 0658 05/06/21 1826 05/07/21 1016 05/08/21 0629  WBC 14.5*   < > 26.0* 20.3* 15.9* 11.6* 7.2  NEUTROABS 11.5*  --   --   --   --   --   --   HGB 5.6*   < > 6.6* 6.9* 7.8* 8.5* 8.5*  HCT 17.4*   < > 19.1* 20.6* 23.5* 25.9* 25.9*  MCV 105.5*   < > 97.0 92.0 91.4 91.5 92.5  PLT 234   < > 233 210 PLATELET CLUMPS NOTED ON SMEAR, UNABLE TO ESTIMATE 175 153   < > = values in this interval not displayed.    Basic Metabolic Panel: Recent Labs  Lab 05/05/21 0801 05/06/21 0605 05/07/21 0944 05/07/21 2028 05/08/21 0629  NA 136 136 137 138 139  K 3.9 4.0 3.5 3.5 3.5  CL 108 108 111 106 111  CO2 21* 21* 21* 21* 24  GLUCOSE 212* 145* 99 89 90  BUN '21 17 9 8 '$ 6*  CREATININE 1.45* 1.15 0.82 0.75 0.74  CALCIUM 8.2* 7.9* 8.0* 8.0* 7.8*  MG 1.9  1.9 2.1  --   --   PHOS  --   --  1.4* 3.0  --    GFR: Estimated Creatinine Clearance: 117.3 mL/min (by C-G formula based on SCr of 0.74 mg/dL). Recent Labs  Lab 05/05/21 0801 05/05/21 1000 05/05/21 1751 05/06/21 0658 05/06/21 1826 05/07/21 1016 05/08/21 0629  WBC 14.5*  --    < > 20.3* 15.9* 11.6* 7.2  LATICACIDVEN 3.9* 3.0*  --   --   --   --   --    < > = values in this interval not displayed.    Liver Function Tests: Recent Labs  Lab 05/05/21 0801  AST 17  ALT 15  ALKPHOS 37*  BILITOT 0.6  PROT 4.4*  ALBUMIN 2.6*   Recent Labs  Lab 05/05/21 0801  LIPASE 28   No results for input(s): AMMONIA in the last 168 hours.  ABG     Component Value Date/Time   HCO3 27.9 01/28/2017 1222   HCO3 29.1 (H) 01/28/2017 1222   TCO2 29 01/28/2017 1222   TCO2 31 01/28/2017 1222   ACIDBASEDEF 1.0 03/21/2016 1101   O2SAT 70.0 01/28/2017 1222   O2SAT 71.0 01/28/2017 1222     Coagulation Profile: Recent Labs  Lab 05/05/21 0801 05/06/21 0658 05/07/21 0915 05/07/21 1101 05/08/21 0629  INR 8.4* 1.3* >10.0* 1.1 1.1    Cardiac Enzymes: No results for input(s): CKTOTAL, CKMB, CKMBINDEX, TROPONINI in the last 168 hours.  HbA1C: Hgb A1c MFr Bld  Date/Time Value Ref Range Status  02/28/2021 03:43 PM 5.5 4.6 - 6.5 % Final    Comment:    Glycemic Control Guidelines for People with Diabetes:Non Diabetic:  <6%Goal of Therapy: <7%Additional Action Suggested:  >8%   09/16/2020 03:53 PM 5.6 4.6 - 6.5 % Final    Comment:    Glycemic Control Guidelines for People with Diabetes:Non Diabetic:  <6%Goal of Therapy: <7%Additional Action Suggested:  >8%     CBG: Recent Labs  Lab 05/06/21 1105 05/06/21 1522 05/06/21 1932 05/06/21 2259 05/07/21 0334  GLUCAP 166* 113* 125* 119* 86    Critical care time: n/a     Roselie Awkward, MD [facility] Pager: [phone] Cell: [phone] After 7:00 pm call Elink  [phone]

## 2021-05-08 NOTE — Progress Notes (Addendum)
Daily Rounding Note  05/08/2021, 10:49 AM  LOS: 3 days   SUBJECTIVE:   Chief complaint:  BPR, anemia,  supratherapeutic INR    LLQ pain improved.  No nausea on clears.   Stools more of a burgundy color now not bright red blood as before.  Volume of stool also diminished.  Feels okay overall. Off Levophed since 2 AM yesterday  OBJECTIVE:         Vital signs in last 24 hours:    Temp:  [97.8 F (36.6 C)-98.4 F (36.9 C)] 98 F (36.7 C) (07/25 0800) Pulse Rate:  [69-81] 81 (07/25 0800) Resp:  [14-23] 17 (07/25 0800) BP: (91-123)/(50-93) 116/50 (07/25 0800) SpO2:  [95 %-100 %] 98 % (07/25 0800) Weight:  [118.8 kg] 118.8 kg (07/25 0500) Last BM Date: 05/08/21 Filed Weights   05/06/21 0800 05/08/21 0500  Weight: 117.4 kg 118.8 kg   General: Looks chronically ill.  Obese.  Comfortable, pleasant. Heart: Paced regular rhythm in the 70s. Chest: Clear bilaterally.  No labored breathing.  Good breath sounds. Abdomen: Soft, obese.  Not tender.  Active bowel sounds. Extremities: Edema in all 4 limbs Neuro/Psych: Alert.  Appropriate.  In good spirits.  Oriented x3.  Moves all 4 limbs, no obvious deficits or tremors.  Intake/Output from previous day: 07/24 0701 - 07/25 0700 In: 1010.1 [P.O.:100; I.V.:95.3; IV Piggyback:814.8] Out: 250 [Urine:250]  Intake/Output this shift: Total I/O In: 17.6 [I.V.:9.2; IV Piggyback:8.4] Out: -   Lab Results: Recent Labs    05/06/21 1826 05/07/21 1016 05/08/21 0629  WBC 15.9* 11.6* 7.2  HGB 7.8* 8.5* 8.5*  HCT 23.5* 25.9* 25.9*  PLT PLATELET CLUMPS NOTED ON SMEAR, UNABLE TO ESTIMATE 175 153   BMET Recent Labs    05/07/21 0944 05/07/21 2028 05/08/21 0629  NA 137 138 139  K 3.5 3.5 3.5  CL 111 106 111  CO2 21* 21* 24  GLUCOSE 99 89 90  BUN 9 8 6*  CREATININE 0.82 0.75 0.74  CALCIUM 8.0* 8.0* 7.8*   LFT No results for input(s): PROT, ALBUMIN, AST, ALT, ALKPHOS, BILITOT,  BILIDIR, IBILI in the last 72 hours. PT/INR Recent Labs    05/07/21 1101 05/08/21 0629  LABPROT 14.6 14.5  INR 1.1 1.1   Hepatitis Panel No results for input(s): HEPBSAG, HCVAB, HEPAIGM, HEPBIGM in the last 72 hours.  Studies/Results: No results found.  Scheduled Meds:  Chlorhexidine Gluconate Cloth  6 each Topical Q2200   pantoprazole (PROTONIX) IV  40 mg Intravenous Q12H   sodium chloride flush  10-40 mL Intracatheter Q12H   traZODone  100 mg Oral QHS   venlafaxine XR  75 mg Oral Q breakfast   Continuous Infusions:  sodium chloride     sodium chloride 10 mL/hr at 05/08/21 0800   cefTRIAXone (ROCEPHIN)  IV Stopped (05/07/21 1226)   metronidazole Stopped (05/08/21 0505)   PRN Meds:.acetaminophen, albuterol, ALPRAZolam, carisoprodol, docusate sodium, ondansetron (ZOFRAN) IV, polyethylene glycol, sodium chloride flush, traMADol, zolpidem   ASSESMENT:   Bleeding per rectum/bloody diarrhea, abdominal pain.  05/05/2021 CT angio abdomen/pelvis: Colonic diverticulosis with mild inflammation at proximal sigmoid, ?mild/resolving sigmoid diverticulitis?  Also could be ischemic colitis.  Day 4  Rocephin, Flagyl.       Anemia.  Hgb 5.6 >> 8.5.  s/p 6 PRBCs in total.       Coagulopathy.  Resolved.  INR > 10... 1.1.  Warfarin on hold.  Received IV vitamin K.  Lymphedema.  Chronic RLE ulcer.     PLAN      Advance to carb modified diet.  No indication for bleeding scan.  Follow CBC.  Pharm D wondering whether or not antibiotics are still needed, if so could Rocephin/Flagyl be switched to Augmentin? Will d/w Dr Lyndel Safe.      Azucena Freed  05/08/2021, 10:49 AM Phone [phone]     Attending physician's note   I have taken an interval history, reviewed the chart and examined the patient. I agree with the Advanced Practitioner's note, impression and recommendations.   Rectal bleeding resolved. CTA 05/05/2021 with acute diverticulitis. On Rocephin/flagyl. D/d ischemic. S/P 6U  PRBC in setting of supratherapeutic INR (on Coumadin for A. Fib)  Plan: -Continue antibiotics for total of 10 days.  Once able to tolerate p.o., can switch to Augmentin. -FU with Dr. Havery Moros in 3-4 weeks.  Plan outpt colon 6-8 weeks (after diverticulitis) -Can resume AC (?  Eliquis) 48 hrs after bleeding has completely stopped. -Advance diet -Avoid nonsteroidals -Please call if any change in clinical status or with any questions   Carmell Austria, MD [facility] [phone]

## 2021-05-09 DIAGNOSIS — K5793 Diverticulitis of intestine, part unspecified, without perforation or abscess with bleeding: Secondary | ICD-10-CM | POA: Diagnosis not present

## 2021-05-09 DIAGNOSIS — D649 Anemia, unspecified: Secondary | ICD-10-CM | POA: Diagnosis not present

## 2021-05-09 DIAGNOSIS — K5732 Diverticulitis of large intestine without perforation or abscess without bleeding: Secondary | ICD-10-CM | POA: Diagnosis not present

## 2021-05-09 DIAGNOSIS — I482 Chronic atrial fibrillation, unspecified: Secondary | ICD-10-CM | POA: Diagnosis not present

## 2021-05-09 DIAGNOSIS — I5021 Acute systolic (congestive) heart failure: Secondary | ICD-10-CM

## 2021-05-09 LAB — CBC
HCT: 27 % — ABNORMAL LOW (ref 39.0–52.0)
Hemoglobin: 8.7 g/dL — ABNORMAL LOW (ref 13.0–17.0)
MCH: 30.4 pg (ref 26.0–34.0)
MCHC: 32.2 g/dL (ref 30.0–36.0)
MCV: 94.4 fL (ref 80.0–100.0)
Platelets: 166 10*3/uL (ref 150–400)
RBC: 2.86 MIL/uL — ABNORMAL LOW (ref 4.22–5.81)
RDW: 18.5 % — ABNORMAL HIGH (ref 11.5–15.5)
WBC: 6 10*3/uL (ref 4.0–10.5)
nRBC: 0.5 % — ABNORMAL HIGH (ref 0.0–0.2)

## 2021-05-09 LAB — HEMOGLOBIN A1C
Hgb A1c MFr Bld: 5.5 % (ref 4.8–5.6)
Mean Plasma Glucose: 111 mg/dL

## 2021-05-09 LAB — BASIC METABOLIC PANEL
Anion gap: 5 (ref 5–15)
BUN: 7 mg/dL — ABNORMAL LOW (ref 8–23)
CO2: 22 mmol/L (ref 22–32)
Calcium: 8 mg/dL — ABNORMAL LOW (ref 8.9–10.3)
Chloride: 112 mmol/L — ABNORMAL HIGH (ref 98–111)
Creatinine, Ser: 0.7 mg/dL (ref 0.61–1.24)
GFR, Estimated: >60 mL/min (ref >60)
Glucose, Bld: 92 mg/dL (ref 70–99)
Potassium: 3.8 mmol/L (ref 3.5–5.1)
Sodium: 139 mmol/L (ref 135–145)

## 2021-05-09 MED ORDER — AMOXICILLIN-POT CLAVULANATE 875-125 MG PO TABS
1.0000 | ORAL_TABLET | Freq: Two times a day (BID) | ORAL | Status: DC
  Administered 2021-05-09 – 2021-05-10 (×3): 1 via ORAL
  Filled 2021-05-09 (×3): qty 1

## 2021-05-09 MED ORDER — DM-GUAIFENESIN ER 30-600 MG PO TB12
1.0000 | ORAL_TABLET | Freq: Two times a day (BID) | ORAL | Status: DC | PRN
  Filled 2021-05-09: qty 1

## 2021-05-09 NOTE — Progress Notes (Signed)
Physical Therapy Evaluation Patient Details Name: [name] MRN: [id number] DOB: 05/24/1956 Today's Date: 05/09/2021   History of Present Illness  65 year old admitted 7/22 admitted for hemorrhagic shock from GI bleed.  Initially went to the ICU due to hypotension requiring pressors.PMH:  CHF EF 40%, A. fib status post pacemaker on Coumadin, PAD, chronic right lower extremity wound  Clinical Impression  Pt admitted with above diagnosis. Pt did well overall and was able to ambualte with RW in hallway and appears to be close to baseline. Will follow to ensure mobility and practice steps prior to d/c home.  Pt currently with functional limitations due to the deficits listed below (see PT Problem List). Pt will benefit from skilled PT to increase their independence and safety with mobility to allow discharge to the venue listed below.       Follow Up Recommendations No PT follow up    Equipment Recommendations  None recommended by PT    Recommendations for Other Services       Precautions / Restrictions Precautions Precautions: Fall Restrictions Weight Bearing Restrictions: No      Mobility  Bed Mobility Overal bed mobility: Independent                  Transfers Overall transfer level: Independent                  Ambulation/Gait Ambulation/Gait assistance: Supervision Gait Distance (Feet): 280 Feet Assistive device: Rolling walker (2 wheeled) Gait Pattern/deviations: Antalgic;Step-through pattern;Decreased stride length   Gait velocity interpretation: 1.31 - 2.62 ft/sec, indicative of limited community ambulator General Gait Details: Pt steady with RW and pt states he used it at home due to left knee arthritis.  No LOB.  Stairs            Wheelchair Mobility    Modified Rankin (Stroke Patients Only)       Balance Overall balance assessment: Needs assistance Sitting-balance support: No upper extremity supported;Feet supported Sitting  balance-Leahy Scale: Good     Standing balance support: No upper extremity supported;During functional activity Standing balance-Leahy Scale: Fair                               Pertinent Vitals/Pain Pain Assessment: No/denies pain    Home Living Family/patient expects to be discharged to:: Private residence Living Arrangements: Children Available Help at Discharge: Family;Available PRN/intermittently Type of Home: Mobile home Home Access: Stairs to enter Entrance Stairs-Rails: Right Entrance Stairs-Number of Steps: 5 Home Layout: One level Home Equipment: Walker - 2 wheels;Cane - single point      Prior Function Level of Independence: Independent with assistive device(s);Needs assistance   Gait / Transfers Assistance Needed: used 2 canes vs RW     Comments: can stay with girlfriend as well per pt     Hand Dominance   Dominant Hand: Left    Extremity/Trunk Assessment   Upper Extremity Assessment Upper Extremity Assessment: Defer to OT evaluation    Lower Extremity Assessment Lower Extremity Assessment: Overall WFL for tasks assessed;LLE deficits/detail LLE Deficits / Details: Strength and ROM WFL but plan was for knee replacement soon    Cervical / Trunk Assessment Cervical / Trunk Assessment: Normal  Communication   Communication: No difficulties  Cognition Arousal/Alertness: Awake/alert Behavior During Therapy: WFL for tasks assessed/performed Overall Cognitive Status: Within Functional Limits for tasks assessed  General Comments General comments (skin integrity, edema, etc.): 104/65, 71 bpm, sats >90% on RA    Exercises General Exercises - Lower Extremity Ankle Circles/Pumps: AROM;Both;10 reps;Seated Long Arc Quad: AROM;Both;10 reps;Seated Hip Flexion/Marching: AROM;Both;10 reps;Seated   Assessment/Plan    PT Assessment Patient needs continued PT services  PT Problem List Decreased  activity tolerance;Decreased balance;Decreased mobility       PT Treatment Interventions DME instruction;Gait training;Stair training;Therapeutic exercise;Therapeutic activities;Functional mobility training;Balance training;Patient/family education    PT Goals (Current goals can be found in the Care Plan section)  Acute Rehab PT Goals Patient Stated Goal: to gohome PT Goal Formulation: With patient Time For Goal Achievement: 05/23/21 Potential to Achieve Goals: Good    Frequency Min 3X/week   Barriers to discharge        Co-evaluation               AM-PAC PT "6 Clicks" Mobility  Outcome Measure Help needed turning from your back to your side while in a flat bed without using bedrails?: None Help needed moving from lying on your back to sitting on the side of a flat bed without using bedrails?: None Help needed moving to and from a bed to a chair (including a wheelchair)?: None Help needed standing up from a chair using your arms (e.g., wheelchair or bedside chair)?: A Little Help needed to walk in hospital room?: A Little Help needed climbing 3-5 steps with a railing? : A Little 6 Click Score: 21    End of Session Equipment Utilized During Treatment: Gait belt Activity Tolerance: Patient tolerated treatment well Patient left: in chair;with call bell/phone within reach;with chair alarm set Nurse Communication: Mobility status PT Visit Diagnosis: Muscle weakness (generalized) (M62.81)    Time: [id number] PT Time Calculation (min) (ACUTE ONLY): 16 min   Charges:   PT Evaluation $PT Eval Low Complexity: 1 Low          Jahzaria Vary M,PT Acute Rehab Services [phone] (pager)   Alvira Philips 05/09/2021, 2:22 PM

## 2021-05-09 NOTE — Progress Notes (Signed)
PROGRESS NOTE    [name]  [id number] DOB: September 13, 1956 DOA: 05/05/2021 PCP: [name], MD   Brief Narrative:  65 year old with history of CHF EF 40%, A. fib status post pacemaker on Coumadin, PAD, chronic right lower extremity wound admitted for hemorrhagic shock from GI bleed.  Initially went to the ICU due to hypotension requiring pressors.  GI team was consulted.  Plans to treat with oral antibiotics for total of 10 days with outpatient colonoscopy.   Assessment & Plan:   Active Problems:   Severe anemia   Hemorrhagic shock (HCC)   Gastrointestinal hemorrhage   Diverticulitis of colon  Hemorrhagic shock from GI bleed Acute blood loss anemia Supratherapeutic INR, POA greater than 8 - GI team consulted and has been following.  Okay to resume anticoagulation 48 hours after rectal bleeding has stopped -Status post 6 unit PRBC.  Hemoglobin stable, hemoglobin 8.7. Baseline in 2021 Hb ~13 - INR today 1.1  Left lower abdominal pain with leukocytosis Diverticulitis - Empiric IV Rocephin and Flagyl, total 10-day antibiotic.  We will switch to oral Augmentin - Diet as tolerated.  Avoid NSAIDs - Outpatient C-scope in 6-8 weeks  Congestive heart failure with reduced ejection fraction, 40% status post BiV pacmaker - Requires volume resuscitation therefore antihypertensives on hold  COPD - As needed bronchodilators  Hx of Atrial fibrillation, permanent Complete heart block status post ablation - Status post AV nodal ablation to allow BiV pacing -We will resume Eliquis 48 hours after his rectal bleeding has stopped.  Diabetes mellitus type 2 - A1c 5.5. Diet controlled?   Nonobstructive CAD - Currently chest pain-free  History of OSA - Unable to tolerate CPAP  Discussed with patient, Dr. [name] and Dr. Lyndel Safe.  Okay with starting Eliquis 48 hours after his bleeding has subsided.  We will consult TOC so we can make arrangements for his outpatient Eliquis    DVT  prophylaxis: SCDs Start: 05/05/21 1118  Code Status:  Family Communication:    Status is: Inpatient  Remains inpatient appropriate because:Inpatient level of care appropriate due to severity of illness  Dispo: The patient is from: Home              Anticipated d/c is to: Home              Patient currently is not medically stable to d/c.  If patient consistently tolerates p.o. today and hemoglobin stays stable we should build to discharge him tomorrow   Difficult to place patient No     Subjective: Sitting up in the chair, does not have any complaints.  Had episode of small bloody bowel movement yesterday, no BM yet today.  Patient is okay with taking Eliquis even if it is costly for him.  I explained him that we will get our care management team to help him obtain 30 days of free supply, he is agreeable  Review of Systems Otherwise negative except as per HPI, including: General: Denies fever, chills, night sweats or unintended weight loss. Resp: Denies cough, wheezing, shortness of breath. Cardiac: Denies chest pain, palpitations, orthopnea, paroxysmal nocturnal dyspnea. GI: Denies abdominal pain, nausea, vomiting, diarrhea or constipation GU: Denies dysuria, frequency, hesitancy or incontinence MS: Denies muscle aches, joint pain or swelling Neuro: Denies headache, neurologic deficits (focal weakness, numbness, tingling), abnormal gait Psych: Denies anxiety, depression, SI/HI/AVH Skin: Denies new rashes or lesions ID: Denies sick contacts, exotic exposures, travel  Examination:  General exam: Appears calm and comfortable  Respiratory system: Clear  to auscultation. Respiratory effort normal. Cardiovascular system: S1 & S2 heard, RRR. No JVD, murmurs, rubs, gallops or clicks. No pedal edema. Gastrointestinal system: Abdomen is nondistended, soft and nontender. No organomegaly or masses felt. Normal bowel sounds heard. Central nervous system: Alert and oriented. No focal  neurological deficits. Extremities: Symmetric 5 x 5 power. Skin: No rashes, lesions or ulcers Psychiatry: Judgement and insight appear normal. Mood & affect appropriate.     Objective: Vitals:   05/09/21 0330 05/09/21 0408 05/09/21 0415 05/09/21 0748  BP:   126/70   Pulse:      Resp:      Temp: 97.8 F (36.6 C)   98.1 F (36.7 C)  TempSrc: Axillary   Oral  SpO2:      Weight:  115.9 kg    Height:        Intake/Output Summary (Last 24 hours) at 05/09/2021 0751 Last data filed at 05/09/2021 0400 Gross per 24 hour  Intake 642.2 ml  Output --  Net 642.2 ml   Filed Weights   05/06/21 0800 05/08/21 0500 05/09/21 0408  Weight: 117.4 kg 118.8 kg 115.9 kg     Data Reviewed:   CBC: Recent Labs  Lab 05/05/21 0801 05/05/21 1751 05/06/21 0658 05/06/21 1826 05/07/21 1016 05/08/21 0629 05/09/21 0259  WBC 14.5*   < > 20.3* 15.9* 11.6* 7.2 6.0  NEUTROABS 11.5*  --   --   --   --   --   --   HGB 5.6*   < > 6.9* 7.8* 8.5* 8.5* 8.7*  HCT 17.4*   < > 20.6* 23.5* 25.9* 25.9* 27.0*  MCV 105.5*   < > 92.0 91.4 91.5 92.5 94.4  PLT 234   < > 210 PLATELET CLUMPS NOTED ON SMEAR, UNABLE TO ESTIMATE 175 153 166   < > = values in this interval not displayed.   Basic Metabolic Panel: Recent Labs  Lab 05/05/21 0801 05/06/21 0605 05/07/21 0944 05/07/21 2028 05/08/21 0629 05/09/21 0259  NA 136 136 137 138 139 139  K 3.9 4.0 3.5 3.5 3.5 3.8  CL 108 108 111 106 111 112*  CO2 21* 21* 21* 21* 24 22  GLUCOSE 212* 145* 99 89 90 92  BUN '21 17 9 8 '$ 6* 7*  CREATININE 1.45* 1.15 0.82 0.75 0.74 0.70  CALCIUM 8.2* 7.9* 8.0* 8.0* 7.8* 8.0*  MG 1.9 1.9 2.1  --   --   --   PHOS  --   --  1.4* 3.0  --   --    GFR: Estimated Creatinine Clearance: 115.9 mL/min (by C-G formula based on SCr of 0.7 mg/dL). Liver Function Tests: Recent Labs  Lab 05/05/21 0801  AST 17  ALT 15  ALKPHOS 37*  BILITOT 0.6  PROT 4.4*  ALBUMIN 2.6*   Recent Labs  Lab 05/05/21 0801  LIPASE 28   No results for  input(s): AMMONIA in the last 168 hours. Coagulation Profile: Recent Labs  Lab 05/05/21 0801 05/06/21 0658 05/07/21 0915 05/07/21 1101 05/08/21 0629  INR 8.4* 1.3* >10.0* 1.1 1.1   Cardiac Enzymes: No results for input(s): CKTOTAL, CKMB, CKMBINDEX, TROPONINI in the last 168 hours. BNP (last 3 results) No results for input(s): PROBNP in the last 8760 hours. HbA1C: No results for input(s): HGBA1C in the last 72 hours. CBG: Recent Labs  Lab 05/06/21 1105 05/06/21 1522 05/06/21 1932 05/06/21 2259 05/07/21 0334  GLUCAP 166* 113* 125* 119* 86   Lipid Profile: No results for input(s): CHOL,  HDL, LDLCALC, TRIG, CHOLHDL, LDLDIRECT in the last 72 hours. Thyroid Function Tests: No results for input(s): TSH, T4TOTAL, FREET4, T3FREE, THYROIDAB in the last 72 hours. Anemia Panel: No results for input(s): VITAMINB12, FOLATE, FERRITIN, TIBC, IRON, RETICCTPCT in the last 72 hours. Sepsis Labs: Recent Labs  Lab 05/05/21 0801 05/05/21 1000  LATICACIDVEN 3.9* 3.0*    Recent Results (from the past 240 hour(s))  Culture, blood (routine x 2)     Status: None (Preliminary result)   Collection Time: 05/05/21  8:00 AM   Specimen: BLOOD  Result Value Ref Range Status   Specimen Description BLOOD SITE NOT SPECIFIED  Final   Special Requests   Final    BOTTLES DRAWN AEROBIC AND ANAEROBIC Blood Culture adequate volume   Culture   Final    NO GROWTH 3 DAYS Performed at [facility], [address], [place], [place] [address]    Report Status PENDING  Incomplete  Culture, blood (routine x 2)     Status: None (Preliminary result)   Collection Time: 05/05/21  8:03 AM   Specimen: BLOOD  Result Value Ref Range Status   Specimen Description BLOOD SITE NOT SPECIFIED  Final   Special Requests   Final    BOTTLES DRAWN AEROBIC AND ANAEROBIC Blood Culture adequate volume   Culture   Final    NO GROWTH 3 DAYS Performed at [facility], [address] [place], [place] [address]     Report Status PENDING  Incomplete  Resp Panel by RT-PCR (Flu A&B, Covid) Nasopharyngeal Swab     Status: None   Collection Time: 05/05/21  8:25 AM   Specimen: Nasopharyngeal Swab; Nasopharyngeal(NP) swabs in vial transport medium  Result Value Ref Range Status   SARS Coronavirus 2 by RT PCR NEGATIVE NEGATIVE Final    Comment: (NOTE) SARS-CoV-2 target nucleic acids are NOT DETECTED.  The SARS-CoV-2 RNA is generally detectable in upper respiratory specimens during the acute phase of infection. The lowest concentration of SARS-CoV-2 viral copies this assay can detect is 138 copies/mL. A negative result does not preclude SARS-Cov-2 infection and should not be used as the sole basis for treatment or other patient management decisions. A negative result may occur with  improper specimen collection/handling, submission of specimen other than nasopharyngeal swab, presence of viral mutation(s) within the areas targeted by this assay, and inadequate number of viral copies(<138 copies/mL). A negative result must be combined with clinical observations, patient history, and epidemiological information. The expected result is Negative.  Fact Sheet for Patients:  EntrepreneurPulse.com.au  Fact Sheet for Healthcare Providers:  IncredibleEmployment.be  This test is no t yet approved or cleared by the [place] FDA and  has been authorized for detection and/or diagnosis of SARS-CoV-2 by FDA under an Emergency Use Authorization (EUA). This EUA will remain  in effect (meaning this test can be used) for the duration of the COVID-19 declaration under Section 564(b)(1) of the Act, 21 U.S.C.section 360bbb-3(b)(1), unless the authorization is terminated  or revoked sooner.       Influenza A by PCR NEGATIVE NEGATIVE Final   Influenza B by PCR NEGATIVE NEGATIVE Final    Comment: (NOTE) The Xpert Xpress SARS-CoV-2/FLU/RSV plus assay is intended as an aid in the  diagnosis of influenza from Nasopharyngeal swab specimens and should not be used as a sole basis for treatment. Nasal washings and aspirates are unacceptable for Xpert Xpress SARS-CoV-2/FLU/RSV testing.  Fact Sheet for Patients: EntrepreneurPulse.com.au  Fact Sheet for Healthcare  Providers: IncredibleEmployment.be  This test is not yet approved or cleared by the [place] and has been authorized for detection and/or diagnosis of SARS-CoV-2 by FDA under an Emergency Use Authorization (EUA). This EUA will remain in effect (meaning this test can be used) for the duration of the COVID-19 declaration under Section 564(b)(1) of the Act, 21 U.S.C. section 360bbb-3(b)(1), unless the authorization is terminated or revoked.  Performed at [facility], [place] [address] [place], [place] [address]   MRSA Next Gen by PCR, Nasal     Status: None   Collection Time: 05/05/21  3:41 PM   Specimen: Nasal Mucosa; Nasal Swab  Result Value Ref Range Status   MRSA by PCR Next Gen NOT DETECTED NOT DETECTED Final    Comment: (NOTE) The GeneXpert MRSA Assay (FDA approved for NASAL specimens only), is one component of a comprehensive MRSA colonization surveillance program. It is not intended to diagnose MRSA infection nor to guide or monitor treatment for MRSA infections. Test performance is not FDA approved in patients less than 43 years old. Performed at [facility], [place] [address] [place], [place] [address]          Radiology Studies: No results found.      Scheduled Meds:  Chlorhexidine Gluconate Cloth  6 each Topical Q2200   pantoprazole (PROTONIX) IV  40 mg Intravenous Q12H   sodium chloride flush  10-40 mL Intracatheter Q12H   traZODone  100 mg Oral QHS   venlafaxine XR  75 mg Oral Q breakfast   Continuous Infusions:  sodium chloride     sodium chloride 10 mL/hr at 05/09/21 0400   cefTRIAXone (ROCEPHIN)  IV Stopped  (05/08/21 1227)   metronidazole 500 mg (05/09/21 0409)     LOS: 4 days   Time spent= 35 mins    [name] Arsenio Loader, MD [facility]  If 7PM-7AM, please contact night-coverage  05/09/2021, 7:51 AM

## 2021-05-10 ENCOUNTER — Other Ambulatory Visit (HOSPITAL_COMMUNITY): Payer: Self-pay

## 2021-05-10 DIAGNOSIS — K5732 Diverticulitis of large intestine without perforation or abscess without bleeding: Secondary | ICD-10-CM | POA: Diagnosis not present

## 2021-05-10 DIAGNOSIS — R578 Other shock: Secondary | ICD-10-CM | POA: Diagnosis not present

## 2021-05-10 DIAGNOSIS — D649 Anemia, unspecified: Secondary | ICD-10-CM | POA: Diagnosis not present

## 2021-05-10 DIAGNOSIS — K5793 Diverticulitis of intestine, part unspecified, without perforation or abscess with bleeding: Secondary | ICD-10-CM | POA: Diagnosis not present

## 2021-05-10 LAB — CBC
HCT: 25.2 % — ABNORMAL LOW (ref 39.0–52.0)
Hemoglobin: 8 g/dL — ABNORMAL LOW (ref 13.0–17.0)
MCH: 30 pg (ref 26.0–34.0)
MCHC: 31.7 g/dL (ref 30.0–36.0)
MCV: 94.4 fL (ref 80.0–100.0)
Platelets: 169 10*3/uL (ref 150–400)
RBC: 2.67 MIL/uL — ABNORMAL LOW (ref 4.22–5.81)
RDW: 18.3 % — ABNORMAL HIGH (ref 11.5–15.5)
WBC: 5.7 10*3/uL (ref 4.0–10.5)
nRBC: 0.4 % — ABNORMAL HIGH (ref 0.0–0.2)

## 2021-05-10 LAB — CULTURE, BLOOD (ROUTINE X 2)
Culture: NO GROWTH
Culture: NO GROWTH
Special Requests: ADEQUATE
Special Requests: ADEQUATE

## 2021-05-10 LAB — BASIC METABOLIC PANEL
Anion gap: 6 (ref 5–15)
BUN: 7 mg/dL — ABNORMAL LOW (ref 8–23)
CO2: 22 mmol/L (ref 22–32)
Calcium: 7.9 mg/dL — ABNORMAL LOW (ref 8.9–10.3)
Chloride: 109 mmol/L (ref 98–111)
Creatinine, Ser: 0.77 mg/dL (ref 0.61–1.24)
GFR, Estimated: >60 mL/min (ref >60)
Glucose, Bld: 100 mg/dL — ABNORMAL HIGH (ref 70–99)
Potassium: 4 mmol/L (ref 3.5–5.1)
Sodium: 137 mmol/L (ref 135–145)

## 2021-05-10 LAB — MAGNESIUM: Magnesium: 2 mg/dL (ref 1.7–2.4)

## 2021-05-10 MED ORDER — AMOXICILLIN-POT CLAVULANATE 875-125 MG PO TABS
1.0000 | ORAL_TABLET | Freq: Two times a day (BID) | ORAL | 0 refills | Status: AC
  Filled 2021-05-10: qty 12, 6d supply, fill #0

## 2021-05-10 MED ORDER — APIXABAN 5 MG PO TABS
5.0000 mg | ORAL_TABLET | Freq: Two times a day (BID) | ORAL | 0 refills | Status: DC
  Filled 2021-05-10: qty 60, 30d supply, fill #0

## 2021-05-10 NOTE — Consult Note (Signed)
   [facility] CM Inpatient Consult   05/10/2021  HEDLEY STUDENT 18-Sep-1956 [id number]  [facility] [[facility]] Patient: [name]  Primary Care Provider:  [name], MD, [facility]  Patient is currently enrolled in the Embedded Chronic Care Management program  Patient was screened for additional follow up needs with  Embedded practice which has a chronic disease management Embedded Care Management team.  Plan: Notification sent to the [facility] Management of post hospital transition.  Please contact for further questions,  Natividad Brood, RN BSN [facility] Liaison  [phone] business mobile phone Toll free office [phone]  Fax number: [phone] [email or web address]

## 2021-05-10 NOTE — Progress Notes (Signed)
CSW placed benefit check for Eliquis as requested.   Gilmore Laroche, MSW, [facility]

## 2021-05-10 NOTE — Progress Notes (Signed)
PT Cancellation Note  Patient Details Name: [name] MRN: [id number] DOB: 1956-03-08   Cancelled Treatment:    Reason Eval/Treat Not Completed: Other (comment) not in room when PT attempted to see- question if he had possible left already (discharging today). Will continue efforts while he is in house, otherwise thank you for the opportunity to participate in his care!   Windell Norfolk, DPT, PN1   Supplemental Physical Therapist [facility]    Pager [phone] Acute Rehab Office [phone]

## 2021-05-10 NOTE — Discharge Summary (Signed)
Physician Discharge Summary  [name] [id number] DOB: Oct 17, 1955 DOA: 05/05/2021  PCP: [name], MD  Admit date: 05/05/2021 Discharge date: 05/10/2021  Admitted From: Home Disposition: Home  Recommendations for Outpatient Follow-up:  Follow up with PCP in 1-2 weeks Please obtain BMP/CBC in one week your next doctors visit.  Discontinue Coumadin.  Eliquis to be started 48 hours after his GI bleeding is subsided.  Currently hemoglobin remained stable.  Of Oral Augmentin twice daily for 6 more days Follow-up outpatient gastroenterology for colonoscopy and 6-8 weeks Follow-up outpatient cardiology in 3-4 weeks   Discharge Condition: Stable CODE STATUS: Full code Diet recommendation: Heart healthy  Brief/Interim Summary: 65 year old with history of CHF EF 40%, A. fib status post pacemaker on Coumadin, PAD, chronic right lower extremity wound admitted for hemorrhagic shock from GI bleed.  Initially went to the ICU due to hypotension requiring pressors.  GI team was consulted.  Plans to treat with oral antibiotics for total of 10 days with outpatient colonoscopy.  Patient will be discharged with recommendations as stated above.  Coumadin will be transition to Eliquis.  Advised to take his Eliquis 48 hours after his GI bleeding is subsided, since this morning it has subsided, he can resume his regimen on 05/12/2021. Medically stable for discharge     Assessment & Plan:   Active Problems:   Severe anemia   Hemorrhagic shock (HCC)   Gastrointestinal hemorrhage   Diverticulitis of colon   Hemorrhagic shock from GI bleed Acute blood loss anemia Supratherapeutic INR, POA greater than 8 - GI team consulted and has been following.  Okay to resume anticoagulation 48 hours after rectal bleeding has stopped -Status post 6 unit PRBC.  Hemoglobin stable, hemoglobin 8.7. Baseline in 2021 Hb ~13 - INR today 1.1   Left lower abdominal pain with leukocytosis Diverticulitis - Empiric IV  Rocephin and Flagyl, will prescribe 6 more days of oral Augmentin to complete 10-day course - Diet as tolerated.  Avoid NSAIDs - Outpatient C-scope in 6-8 weeks   Congestive heart failure with reduced ejection fraction, 40% status post BiV pacmaker - Requires volume resuscitation therefore antihypertensives on hold   COPD - As needed bronchodilators   Hx of Atrial fibrillation, permanent Complete heart block status post ablation - Status post AV nodal ablation to allow BiV pacing -We will resume Eliquis 48 hours after his rectal bleeding has stopped.  TOC team consulted.  Advised to start Eliquis on 05/12/2021 if he does not notice any further bleeding.  Diabetes mellitus type 2 - A1c 5.5. Diet controlled?    Nonobstructive CAD - Currently chest pain-free   History of OSA - Unable to tolerate CPAP   Discussed with patient, Dr. [name] and Dr. Lyndel Safe.  Okay with starting Eliquis 48 hours after his bleeding has subsided.   Body mass index is 36.66 kg/m.         Discharge Diagnoses:  Active Problems:   Severe anemia   Hemorrhagic shock (HCC)   Gastrointestinal hemorrhage   Diverticulitis of colon   Subjective: Doing better no complaints.  Tolerating orals, no further GI bleeding.  Wishes to go home  Discharge Exam: Vitals:   05/10/21 0540 05/10/21 0741  BP: (!) 101/46 (!) 86/45  Pulse:  69  Resp: 15 16  Temp: 97.8 F (36.6 C) 98 F (36.7 C)  SpO2: 98% 99%   Vitals:   05/09/21 2305 05/10/21 0500 05/10/21 0540 05/10/21 0741  BP: (!) 116/56  (!) 101/46 (!) 86/45  Pulse: 77   69  Resp: '20  15 16  '$ Temp: 98 F (36.7 C)  97.8 F (36.6 C) 98 F (36.7 C)  TempSrc: Oral  Oral Oral  SpO2: 99%  98% 99%  Weight:  115.9 kg    Height:        General: Pt is alert, awake, not in acute distress Cardiovascular: RRR, S1/S2 +, no rubs, no gallops Respiratory: CTA bilaterally, no wheezing, no rhonchi Abdominal: Soft, NT, ND, bowel sounds + Extremities: no edema, no  cyanosis  Discharge Instructions   Allergies as of 05/10/2021       Reactions   Amiodarone Other (See Comments)   hyperthyroidism   Statins Other (See Comments)   myalgia   Tape Other (See Comments)   Skin Tears Use Paper Tape Only        Medication List     STOP taking these medications    warfarin 10 MG tablet Commonly known as: COUMADIN       TAKE these medications    acetaminophen 500 MG tablet Commonly known as: TYLENOL Take 500 mg by mouth every 6 (six) hours as needed.   AMBULATORY NON FORMULARY MEDICATION Rolling walker with a bench   amoxicillin-clavulanate 875-125 MG tablet Commonly known as: AUGMENTIN Take 1 tablet by mouth every 12 (twelve) hours for 6 days.   augmented betamethasone dipropionate 0.05 % cream Commonly known as: DIPROLENE-AF Apply 1 application topically daily.   carvedilol 12.5 MG tablet Commonly known as: COREG Take 1 tablet (12.5 mg total) by mouth 2 (two) times daily with a meal.   dapagliflozin propanediol 10 MG Tabs tablet Commonly known as: Farxiga Take 1 tablet (10 mg total) by mouth daily before breakfast.   doxepin 10 MG capsule Commonly known as: SINEQUAN Take 2 capsules (20 mg total) by mouth at bedtime as needed.   Dupixent 300 MG/2ML Sopn Generic drug: Dupilumab Inject 1 Dose into the skin every 14 (fourteen) days.   Eliquis 5 MG Tabs tablet Generic drug: apixaban Take 1 tablet (5 mg total) by mouth 2 (two) times daily.   Entresto 49-51 MG Generic drug: sacubitril-valsartan Take 0.5 tablets by mouth 2 (two) times daily.   fluticasone 50 MCG/ACT nasal spray Commonly known as: FLONASE PLACE 2 SPRAYS INTO BOTH NOSTRILS DAILY AS NEEDED FOR ALLERGIES OR RHINITIS.   potassium chloride SA 20 MEQ tablet Commonly known as: KLOR-CON Take 1 tablet (20 mEq total) by mouth daily.   Repatha SureClick [id number] MG/ML Soaj Generic drug: Evolocumab Inject 1 pen into the skin every 14 (fourteen) days.   tamsulosin 0.4  MG Caps capsule Commonly known as: FLOMAX Take 0.4 mg by mouth daily.   Tums 500 MG chewable tablet Generic drug: calcium carbonate Chew 500-2,000 mg by mouth every 4 (four) hours as needed for indigestion or heartburn.       ASK your doctor about these medications    albuterol 108 (90 Base) MCG/ACT inhaler Commonly known as: Ventolin HFA INHALE 2 PUFFS 4 TIMES A DAY as needed   ALPRAZolam 0.5 MG tablet Commonly known as: XANAX TAKE 2 TABLETS BY MOUTH AT BEDTIME   carisoprodol 350 MG tablet Commonly known as: SOMA TAKE 1 TABLET BY MOUTH THREE TIMES A DAY AS NEEDED FOR MUSCLE SPASMS   Cholecalciferol 100 MCG (4000 UT) Caps 1 tab by mouth once daily   digoxin 0.25 MG tablet Commonly known as: LANOXIN TAKE 1/2 TABLET BY MOUTH EVERY DAY   hydrOXYzine 10 MG tablet Commonly known  as: ATARAX/VISTARIL TAKE 1 TABLET BY MOUTH 3 TIMES DAILY AS NEEDED FOR ITCHING.   lidocaine 5 % Commonly known as: LIDODERM APPLY 1 PATCH TO AFFECTED AREA FOR UP TO 12 HOURS . DO NOT USE MORE THAN ONE PATCH IN 24 HOURS.   omeprazole 20 MG capsule Commonly known as: PRILOSEC TAKE 1 CAPSULE BY MOUTH TWICE A DAY   spironolactone 25 MG tablet Commonly known as: ALDACTONE TAKE 1 TABLET BY MOUTH EVERY DAY   torsemide 20 MG tablet Commonly known as: DEMADEX TAKE 4 TABLETS (80 MG TOTAL) BY MOUTH 2 (TWO) TIMES DAILY.   traMADol 50 MG tablet Commonly known as: ULTRAM TAKE 1 TABLET BY MOUTH DAILY AS NEEDED.   traZODone 100 MG tablet Commonly known as: DESYREL TAKE 1 TABLET BY MOUTH EVERY DAY AT BEDTIME AS NEEDED   venlafaxine XR 75 MG 24 hr capsule Commonly known as: EFFEXOR-XR TAKE 1 CAPSULE BY MOUTH DAILY WITH BREAKFAST.   zolpidem 10 MG tablet Commonly known as: AMBIEN TAKE 1 TABLET BY MOUTH AT BEDTIME AS NEEDED        Follow-up Information     [name], MD Follow up in 1 week(s).   Specialties: Internal Medicine, Radiology Contact information: [place] [address] [phone]         Larey Dresser, MD .   Specialty: Cardiology Contact information: [phone] [name] [address] [phone]                Allergies  Allergen Reactions   Amiodarone Other (See Comments)    hyperthyroidism   Statins Other (See Comments)    myalgia   Tape Other (See Comments)    Skin Tears Use Paper Tape Only    You were cared for by a hospitalist during your hospital stay. If you have any questions about your discharge medications or the care you received while you were in the hospital after you are discharged, you can call the unit and asked to speak with the hospitalist on call if the hospitalist that took care of you is not available. Once you are discharged, your primary care physician will handle any further medical issues. Please note that no refills for any discharge medications will be authorized once you are discharged, as it is imperative that you return to your primary care physician (or establish a relationship with a primary care physician if you do not have one) for your aftercare needs so that they can reassess your need for medications and monitor your lab values.   Procedures/Studies: DG Chest Port 1 View  Result Date: 05/05/2021 CLINICAL DATA:  Sepsis and CHF EXAM: PORTABLE CHEST 1 VIEW COMPARISON:  12/22/2019 FINDINGS: Cardiomegaly and vascular pedicle widening. Biventricular ICD/pacer from the left. Small pleural effusions. No pulmonary edema or air bronchogram. IMPRESSION: Cardiomegaly and small pleural effusions. Electronically Signed   By: Monte Fantasia M.D.   On: 05/05/2021 08:43   CUP PACEART REMOTE DEVICE CHECK  Result Date: 04/26/2021 Scheduled remote reviewed. Normal device function.  Next remote 91 days.  XR Knee 1-2 Views Left  Result Date: 04/24/2021 2 views of the left knee show severe end-stage arthritis of left knee.  There is significant varus malalignment and  almost subluxation of the joint.  There is bone-on-bone wear of the medial compartment and the patellofemoral joint and large osteophytes in all 3 compartments.  CT Angio Abd/Pel W and/or Wo Contrast  Result Date: 05/05/2021 CLINICAL DATA:  Nausea, vomiting, diarrhea, GI bleed EXAM: CTA ABDOMEN AND PELVIS WITHOUT AND WITH CONTRAST TECHNIQUE: Multidetector CT imaging of the abdomen and pelvis was performed using the standard protocol during bolus administration of intravenous contrast. Multiplanar reconstructed images and MIPs were obtained and reviewed to evaluate the vascular anatomy. CONTRAST:  137m OMNIPAQUE IOHEXOL 350 MG/ML SOLN COMPARISON:  CTA chest 12/09/2019, and previous FINDINGS: VASCULAR Coronary calcifications. Transvenous pacing leads partially visualized. Aorta: Mild calcified plaque in the infrarenal segment. No aneurysm, dissection, or stenosis. Celiac: Patent without evidence of aneurysm, dissection, vasculitis or significant stenosis. SMA: Replaced right hepatic arterial supply, an anatomic variant. No aneurysm, dissection, or stenosis. Renals: Both renal arteries are patent without evidence of aneurysm, dissection, vasculitis, fibromuscular dysplasia or significant stenosis. IMA: Patent without evidence of aneurysm, dissection, vasculitis or significant stenosis. Inflow: Scattered calcified plaque without aneurysm, dissection, or stenosis. Proximal Outflow: Atheromatous, patent. Veins: Patent hepatic veins, portal vein, SMV, splenic vein, bilateral renal veins, iliac venous system and IVC. No venous pathology identified. Review of the MIP images confirms the above findings. NON-VASCULAR Lower chest: No pleural or pericardial effusion. Minimal linear scarring posteriorly at the right lung base. Hepatobiliary: No focal liver abnormality is seen. No gallstones, gallbladder wall thickening, or biliary dilatation. Pancreas: Unremarkable. No pancreatic ductal dilatation or surrounding inflammatory  changes. Spleen: Normal in size without focal abnormality. Adrenals/Urinary Tract: Normal adrenal glands. Bilateral nephrolithiasis measuring up to 740mleft, 7 mm right. No hydronephrosis. 3.3 cm cyst from the left lower pole. Urinary bladder physiologically distended. Stomach/Bowel: Stomach is physiologically distended, unremarkable. The small bowel is nondilated. Appendix contains appendicoliths, without dilatation or inflammatory change. The colon is nondilated, with scattered diverticula. Mild inflammatory changes around the proximal sigmoid segment. No evidence of abscess. No evidence of active hemorrhage into the lumen of the bowel. Lymphatic: No abdominal or pelvic adenopathy. Reproductive: Mild prostate enlargement with central coarse calcifications. Other: No ascites.  No free air. Musculoskeletal: Mild lumbar scoliosis with multilevel spondylitic change. No fracture or worrisome bone lesion. IMPRESSION: 1. No active extravasation into the bowel. 2. Colonic diverticulosis with questionable mild or resolving proximal sigmoid diverticulitis, no abscess. 3. Nonobstructive bilateral nephrolithiasis 4. Coronary and Aortic Atherosclerosis (ICD10-170.0). Electronically Signed   By: D Lucrezia Europe.D.   On: 05/05/2021 11:51     The results of significant diagnostics from this hospitalization (including imaging, microbiology, ancillary and laboratory) are listed below for reference.     Microbiology: Recent Results (from the past 240 hour(s))  Culture, blood (routine x 2)     Status: None (Preliminary result)   Collection Time: 05/05/21  8:00 AM   Specimen: BLOOD  Result Value Ref Range Status   Specimen Description BLOOD SITE NOT SPECIFIED  Final   Special Requests   Final    BOTTLES DRAWN AEROBIC AND ANAEROBIC Blood Culture adequate volume   Culture   Final    NO GROWTH 4 DAYS Performed at [facility] [place] [id number]  Report Status PENDING  Incomplete  Culture,  blood (routine x 2)     Status: None (Preliminary result)   Collection Time: 05/05/21  8:03 AM   Specimen: BLOOD  Result Value Ref Range Status   Specimen Description BLOOD SITE NOT SPECIFIED  Final   Special Requests   Final    BOTTLES DRAWN AEROBIC AND ANAEROBIC Blood Culture adequate volume   Culture   Final    NO GROWTH 4 DAYS Performed at [facility], [address], [place], [place] [address]    Report Status PENDING  Incomplete  Resp Panel by RT-PCR (Flu A&B, Covid) Nasopharyngeal Swab     Status: None   Collection Time: 05/05/21  8:25 AM   Specimen: Nasopharyngeal Swab; Nasopharyngeal(NP) swabs in vial transport medium  Result Value Ref Range Status   SARS Coronavirus 2 by RT PCR NEGATIVE NEGATIVE Final    Comment: (NOTE) SARS-CoV-2 target nucleic acids are NOT DETECTED.  The SARS-CoV-2 RNA is generally detectable in upper respiratory specimens during the acute phase of infection. The lowest concentration of SARS-CoV-2 viral copies this assay can detect is 138 copies/mL. A negative result does not preclude SARS-Cov-2 infection and should not be used as the sole basis for treatment or other patient management decisions. A negative result may occur with  improper specimen collection/handling, submission of specimen other than nasopharyngeal swab, presence of viral mutation(s) within the areas targeted by this assay, and inadequate number of viral copies(<138 copies/mL). A negative result must be combined with clinical observations, patient history, and epidemiological information. The expected result is Negative.  Fact Sheet for Patients:  EntrepreneurPulse.com.au  Fact Sheet for Healthcare Providers:  IncredibleEmployment.be  This test is no t yet approved or cleared by the [place] FDA and  has been authorized for detection and/or diagnosis of SARS-CoV-2 by FDA under an Emergency Use Authorization (EUA). This EUA will  remain  in effect (meaning this test can be used) for the duration of the COVID-19 declaration under Section 564(b)(1) of the Act, 21 U.S.C.section 360bbb-3(b)(1), unless the authorization is terminated  or revoked sooner.       Influenza A by PCR NEGATIVE NEGATIVE Final   Influenza B by PCR NEGATIVE NEGATIVE Final    Comment: (NOTE) The Xpert Xpress SARS-CoV-2/FLU/RSV plus assay is intended as an aid in the diagnosis of influenza from Nasopharyngeal swab specimens and should not be used as a sole basis for treatment. Nasal washings and aspirates are unacceptable for Xpert Xpress SARS-CoV-2/FLU/RSV testing.  Fact Sheet for Patients: EntrepreneurPulse.com.au  Fact Sheet for Healthcare Providers: IncredibleEmployment.be  This test is not yet approved or cleared by the [place] FDA and has been authorized for detection and/or diagnosis of SARS-CoV-2 by FDA under an Emergency Use Authorization (EUA). This EUA will remain in effect (meaning this test can be used) for the duration of the COVID-19 declaration under Section 564(b)(1) of the Act, 21 U.S.C. section 360bbb-3(b)(1), unless the authorization is terminated or revoked.  Performed at [facility], [place] [address] [place], [place] [address]   MRSA Next Gen by PCR, Nasal     Status: None   Collection Time: 05/05/21  3:41 PM   Specimen: Nasal Mucosa; Nasal Swab  Result Value Ref Range Status   MRSA by PCR Next Gen NOT DETECTED NOT DETECTED Final    Comment: (NOTE) The GeneXpert MRSA Assay (FDA approved for NASAL specimens only), is one component of a comprehensive MRSA colonization surveillance program. It is not intended to diagnose MRSA infection nor to guide or monitor treatment for MRSA infections. Test performance is not FDA approved in patients less than 68 years old. Performed at [facility], [place] [address], [place], [place] [address]      Labs: BNP (last 3  results) Recent Labs    03/28/21 1509 05/05/21 0801  BNP 52.7 [id number]   Basic Metabolic Panel: Recent Labs  Lab 05/05/21 0801 05/06/21 [id number] 05/07/21 0944 05/07/21 2028 05/08/21 0629 05/09/21  0259 05/10/21 0537  NA 136 136 137 138 139 139 137  K 3.9 4.0 3.5 3.5 3.5 3.8 4.0  CL 108 108 111 106 111 112* 109  CO2 21* 21* 21* 21* '24 22 22  '$ GLUCOSE 212* 145* 99 89 90 92 100*  BUN '21 17 9 8 '$ 6* 7* 7*  CREATININE 1.45* 1.15 0.82 0.75 0.74 0.70 0.77  CALCIUM 8.2* 7.9* 8.0* 8.0* 7.8* 8.0* 7.9*  MG 1.9 1.9 2.1  --   --   --  2.0  PHOS  --   --  1.4* 3.0  --   --   --    Liver Function Tests: Recent Labs  Lab 05/05/21 0801  AST 17  ALT 15  ALKPHOS 37*  BILITOT 0.6  PROT 4.4*  ALBUMIN 2.6*   Recent Labs  Lab 05/05/21 0801  LIPASE 28   No results for input(s): AMMONIA in the last 168 hours. CBC: Recent Labs  Lab 05/05/21 0801 05/05/21 1751 05/06/21 1826 05/07/21 1016 05/08/21 0629 05/09/21 0259 05/10/21 0537  WBC 14.5*   < > 15.9* 11.6* 7.2 6.0 5.7  NEUTROABS 11.5*  --   --   --   --   --   --   HGB 5.6*   < > 7.8* 8.5* 8.5* 8.7* 8.0*  HCT 17.4*   < > 23.5* 25.9* 25.9* 27.0* 25.2*  MCV 105.5*   < > 91.4 91.5 92.5 94.4 94.4  PLT 234   < > PLATELET CLUMPS NOTED ON SMEAR, UNABLE TO ESTIMATE 175 153 166 169   < > = values in this interval not displayed.   Cardiac Enzymes: No results for input(s): CKTOTAL, CKMB, CKMBINDEX, TROPONINI in the last 168 hours. BNP: Invalid input(s): POCBNP CBG: Recent Labs  Lab 05/06/21 1105 05/06/21 1522 05/06/21 1932 05/06/21 2259 05/07/21 0334  GLUCAP 166* 113* 125* 119* 86   D-Dimer No results for input(s): DDIMER in the last 72 hours. Hgb A1c No results for input(s): HGBA1C in the last 72 hours. Lipid Profile No results for input(s): CHOL, HDL, LDLCALC, TRIG, CHOLHDL, LDLDIRECT in the last 72 hours. Thyroid function studies No results for input(s): TSH, T4TOTAL, T3FREE, THYROIDAB in the last 72 hours.  Invalid input(s):  FREET3 Anemia work up No results for input(s): VITAMINB12, FOLATE, FERRITIN, TIBC, IRON, RETICCTPCT in the last 72 hours. Urinalysis    Component Value Date/Time   COLORURINE YELLOW 02/28/2021 1543   APPEARANCEUR CLEAR 02/28/2021 1543   LABSPEC 1.015 02/28/2021 1543   PHURINE 5.5 02/28/2021 1543   GLUCOSEU NEGATIVE 02/28/2021 1543   HGBUR NEGATIVE 02/28/2021 [place] 02/28/2021 1543   KETONESUR NEGATIVE 02/28/2021 1543   PROTEINUR NEGATIVE 12/09/2019 1251   UROBILINOGEN 0.2 02/28/2021 1543   NITRITE NEGATIVE 02/28/2021 1543   LEUKOCYTESUR NEGATIVE 02/28/2021 1543   Sepsis Labs Invalid input(s): PROCALCITONIN,  WBC,  LACTICIDVEN Microbiology Recent Results (from the past 240 hour(s))  Culture, blood (routine x 2)     Status: None (Preliminary result)   Collection Time: 05/05/21  8:00 AM   Specimen: BLOOD  Result Value Ref Range Status   Specimen Description BLOOD SITE NOT SPECIFIED  Final   Special Requests   Final    BOTTLES DRAWN AEROBIC AND ANAEROBIC Blood Culture adequate volume   Culture   Final    NO GROWTH 4 DAYS Performed at [facility], [address] [place], [place] [address]    Report Status PENDING  Incomplete  Culture, blood (routine  x 2)     Status: None (Preliminary result)   Collection Time: 05/05/21  8:03 AM   Specimen: BLOOD  Result Value Ref Range Status   Specimen Description BLOOD SITE NOT SPECIFIED  Final   Special Requests   Final    BOTTLES DRAWN AEROBIC AND ANAEROBIC Blood Culture adequate volume   Culture   Final    NO GROWTH 4 DAYS Performed at [facility], [address] [place], [place] [address]    Report Status PENDING  Incomplete  Resp Panel by RT-PCR (Flu A&B, Covid) Nasopharyngeal Swab     Status: None   Collection Time: 05/05/21  8:25 AM   Specimen: Nasopharyngeal Swab; Nasopharyngeal(NP) swabs in vial transport medium  Result Value Ref Range Status   SARS Coronavirus 2 by RT PCR NEGATIVE NEGATIVE  Final    Comment: (NOTE) SARS-CoV-2 target nucleic acids are NOT DETECTED.  The SARS-CoV-2 RNA is generally detectable in upper respiratory specimens during the acute phase of infection. The lowest concentration of SARS-CoV-2 viral copies this assay can detect is 138 copies/mL. A negative result does not preclude SARS-Cov-2 infection and should not be used as the sole basis for treatment or other patient management decisions. A negative result may occur with  improper specimen collection/handling, submission of specimen other than nasopharyngeal swab, presence of viral mutation(s) within the areas targeted by this assay, and inadequate number of viral copies(<138 copies/mL). A negative result must be combined with clinical observations, patient history, and epidemiological information. The expected result is Negative.  Fact Sheet for Patients:  EntrepreneurPulse.com.au  Fact Sheet for Healthcare Providers:  IncredibleEmployment.be  This test is no t yet approved or cleared by the [place] FDA and  has been authorized for detection and/or diagnosis of SARS-CoV-2 by FDA under an Emergency Use Authorization (EUA). This EUA will remain  in effect (meaning this test can be used) for the duration of the COVID-19 declaration under Section 564(b)(1) of the Act, 21 U.S.C.section 360bbb-3(b)(1), unless the authorization is terminated  or revoked sooner.       Influenza A by PCR NEGATIVE NEGATIVE Final   Influenza B by PCR NEGATIVE NEGATIVE Final    Comment: (NOTE) The Xpert Xpress SARS-CoV-2/FLU/RSV plus assay is intended as an aid in the diagnosis of influenza from Nasopharyngeal swab specimens and should not be used as a sole basis for treatment. Nasal washings and aspirates are unacceptable for Xpert Xpress SARS-CoV-2/FLU/RSV testing.  Fact Sheet for Patients: EntrepreneurPulse.com.au  Fact Sheet for Healthcare  Providers: IncredibleEmployment.be  This test is not yet approved or cleared by the [place] FDA and has been authorized for detection and/or diagnosis of SARS-CoV-2 by FDA under an Emergency Use Authorization (EUA). This EUA will remain in effect (meaning this test can be used) for the duration of the COVID-19 declaration under Section 564(b)(1) of the Act, 21 U.S.C. section 360bbb-3(b)(1), unless the authorization is terminated or revoked.  Performed at [facility], [place] [address] [place], [place] [address]   MRSA Next Gen by PCR, Nasal     Status: None   Collection Time: 05/05/21  3:41 PM   Specimen: Nasal Mucosa; Nasal Swab  Result Value Ref Range Status   MRSA by PCR Next Gen NOT DETECTED NOT DETECTED Final    Comment: (NOTE) The GeneXpert MRSA Assay (FDA approved for NASAL specimens only), is one component of a comprehensive MRSA colonization surveillance program. It is not intended to diagnose MRSA infection nor to guide  or monitor treatment for MRSA infections. Test performance is not FDA approved in patients less than 84 years old. Performed at [facility], [place] [address], [place], [place] [address]      Time coordinating discharge:  I have spent 35 minutes face to face with the patient and on the ward discussing the patients care, assessment, plan and disposition with other care givers. >50% of the time was devoted counseling the patient about the risks and benefits of treatment/Discharge disposition and coordinating care.   SIGNED:   Damita Lack, MD  [facility] 05/10/2021, 11:29 AM   If 7PM-7AM, please contact night-coverage

## 2021-05-10 NOTE — Plan of Care (Signed)
  Problem: Education: Goal: Ability to identify signs and symptoms of gastrointestinal bleeding will improve Outcome: Adequate for Discharge   Problem: Bowel/Gastric: Goal: Will show no signs and symptoms of gastrointestinal bleeding Outcome: Adequate for Discharge   Problem: Fluid Volume: Goal: Will show no signs and symptoms of excessive bleeding Outcome: Adequate for Discharge   Problem: Clinical Measurements: Goal: Complications related to the disease process, condition or treatment will be avoided or minimized Outcome: Adequate for Discharge   Problem: Health Behavior/Discharge Planning: Goal: Ability to manage health-related needs will improve Outcome: Adequate for Discharge   Problem: Clinical Measurements: Goal: Ability to maintain clinical measurements within normal limits will improve Outcome: Adequate for Discharge Goal: Will remain free from infection Outcome: Adequate for Discharge Goal: Diagnostic test results will improve Outcome: Adequate for Discharge Goal: Cardiovascular complication will be avoided Outcome: Adequate for Discharge   Problem: Activity: Goal: Risk for activity intolerance will decrease Outcome: Adequate for Discharge   Problem: Nutrition: Goal: Adequate nutrition will be maintained Outcome: Adequate for Discharge   Problem: Elimination: Goal: Will not experience complications related to bowel motility Outcome: Adequate for Discharge Goal: Will not experience complications related to urinary retention Outcome: Adequate for Discharge   Problem: Pain Managment: Goal: General experience of comfort will improve Outcome: Adequate for Discharge

## 2021-05-10 NOTE — Discharge Instructions (Signed)

## 2021-05-10 NOTE — TOC Transition Note (Signed)
Transition of Care [facility]) - CM/SW Discharge Note   Patient Details  Name: [name] MRN: [id number] Date of Birth: [birth year]  Transition of Care [facility]) CM/SW Contact:  Pollie Friar, RN Phone Number: 05/10/2021, 12:45 PM   Clinical Narrative:    Pt discharging home with self care. Pt lives with daughter that is providing transport home. Pt denies issues with home meds or transportation. D/c meds delivered to the room per [place]. Pt feels comfortable with $9.85 co pay for Eliquis. No f/u per PT and no DME needs.  Pt sees wound clinic for rt leg dressing changes weekly.     Final next level of care: Home/Self Care Barriers to Discharge: No Barriers Identified   Patient Goals and CMS Choice        Discharge Placement                       Discharge Plan and Services                                     Social Determinants of Health (SDOH) Interventions     Readmission Risk Interventions No flowsheet data found.

## 2021-05-10 NOTE — Care Management Important Message (Signed)
Important Message  Patient Details  Name: [name] MRN: [id number] Date of Birth: [birth year]   Medicare Important Message Given:  Yes - Important Message mailed due to current National Emergency  Verbal consent obtained due to current National Emergency  Relationship to patient: Self Contact Name: Demitris Call Date: 05/10/21  Time: 72 Phone: [phone] Outcome: No Answer/Busy Important Message mailed to: Patient address on file    Delorse Lek 05/10/2021, 2:24 PM

## 2021-05-10 NOTE — Evaluation (Signed)
Occupational Therapy Evaluation Patient Details Name: [name] MRN: [id number] DOB: 1956/08/18 Today's Date: 05/10/2021    History of Present Illness 65 year old admitted 7/22 admitted for hemorrhagic shock from GI bleed.  Initially went to the ICU due to hypotension requiring pressors.PMH:  CHF EF 40%, A. fib status post pacemaker on Coumadin, PAD, chronic right lower extremity wound   Clinical Impression   Patient evaluated by Occupational Therapy with no further acute OT needs identified. All education has been completed and the patient has no further questions. Pt is able to complete ADLs mod I, but requires rest breaks due to DOE 3/4. He has necessary DME, and intermittent assist at discharge.  See below for any follow-up Occupational Therapy or equipment needs. OT is signing off. Thank you for this referral.     Follow Up Recommendations  No OT follow up;Supervision - Intermittent    Equipment Recommendations  None recommended by OT    Recommendations for Other Services       Precautions / Restrictions Precautions Precautions: Fall      Mobility Bed Mobility               General bed mobility comments: up in chair    Transfers Overall transfer level: Modified independent                    Balance Overall balance assessment: Needs assistance Sitting-balance support: No upper extremity supported;Feet supported Sitting balance-Leahy Scale: Good     Standing balance support: No upper extremity supported;During functional activity Standing balance-Leahy Scale: Fair                             ADL either performed or assessed with clinical judgement   ADL Overall ADL's : Modified independent                                       General ADL Comments: pt with DOE 3/4 and requires rest breaks.  He reports he has a shower chair available for use at home     Vision Baseline Vision/History: Wears glasses Patient  Visual Report: No change from baseline       Perception     Praxis      Pertinent Vitals/Pain Pain Assessment: Faces Faces Pain Scale: Hurts little more Pain Location: chronic Lt knee pain during ambulation     Hand Dominance Left   Extremity/Trunk Assessment Upper Extremity Assessment Upper Extremity Assessment: Generalized weakness   Lower Extremity Assessment Lower Extremity Assessment: Defer to PT evaluation   Cervical / Trunk Assessment Cervical / Trunk Assessment: Normal   Communication Communication Communication: No difficulties   Cognition Arousal/Alertness: Awake/alert Behavior During Therapy: WFL for tasks assessed/performed Overall Cognitive Status: Within Functional Limits for tasks assessed                                     General Comments  Pt reports he plans to return to the gym to start exercising regularly.  He reports he has theraband at home and knows exercises to perform therefore doesn't feel he needs an HEP    Exercises     Shoulder Instructions      Home Living Family/patient expects to be discharged to:: Private residence Living Arrangements: Children Available  Help at Discharge: Family;Available PRN/intermittently Type of Home: Mobile home Home Access: Stairs to enter Entrance Stairs-Number of Steps: 5 Entrance Stairs-Rails: Right Home Layout: One level     Bathroom Shower/Tub: Teacher, early years/pre: Standard Bathroom Accessibility: Yes   Home Equipment: Environmental consultant - 2 wheels;Cane - single point;Shower seat   Additional Comments: Pt reports he plans to discharge to his girlfriend's home who works during the day and can provide increased support/assist      Prior Functioning/Environment Level of Independence: Independent with assistive device(s);Needs assistance  Gait / Transfers Assistance Needed: used 2 canes vs RW ADL's / Homemaking Assistance Needed: Pt reports he was mod I with ADLs             OT Problem List: Impaired balance (sitting and/or standing);Pain      OT Treatment/Interventions:      OT Goals(Current goals can be found in the care plan section) Acute Rehab OT Goals Patient Stated Goal: to go home today OT Goal Formulation: All assessment and education complete, DC therapy  OT Frequency:     Barriers to D/C:            Co-evaluation              AM-PAC OT "6 Clicks" Daily Activity     Outcome Measure Help from another person eating meals?: None Help from another person taking care of personal grooming?: None Help from another person toileting, which includes using toliet, bedpan, or urinal?: None Help from another person bathing (including washing, rinsing, drying)?: None Help from another person to put on and taking off regular upper body clothing?: None Help from another person to put on and taking off regular lower body clothing?: None 6 Click Score: 24   End of Session Equipment Utilized During Treatment: Rolling walker Nurse Communication: Mobility status  Activity Tolerance: Patient limited by fatigue Patient left: in chair;with call bell/phone within reach  OT Visit Diagnosis: Unsteadiness on feet (R26.81)                Time: 1020-1040 OT Time Calculation (min): 20 min Charges:  OT General Charges $OT Visit: 1 Visit OT Evaluation $OT Eval Low Complexity: 1 Low  Nilsa Nutting., OTR/L Acute Rehabilitation Services Pager [phone] Office [phone]   Lucille Passy M 05/10/2021, 11:37 AM

## 2021-05-10 NOTE — TOC Benefit Eligibility Note (Signed)
Transition of Care [facility]) Benefit Eligibility Note    Patient Details  Name: RAYLYNN DELLA MRN: [id number] Date of Birth: April 01, 1956   Medication/Dose: Alveda Reasons 15 MG BID :  CO-PAY- $9.85  ,    XARELTO 20 MG DAILY :  CO-PAY- $9.85  / RIVAROXABAN : NON-FORMULARY  Covered?: Yes  Tier: 3 Drug  Prescription Coverage Preferred Pharmacy: [facility] [address]  Spoke with Person/Company/Phone Number:: DAVID  @  [employer] # [phone]  Co-Pay: $9.85  Prior Approval: No  Deductible:  (NOM DEDUCTIBLE WITHPLAN/ OUT-OF-POCKET :UNMET, CATASTROPHIC PHASE , LOWE INCOME SUBSIDY)  Additional Notes: ELIQUIS  5 MG BID : COVER- YES  , CO-PAY- $9.85 TIER- 3 DRUG , P/A-NO  and   ELIQUIS  10 MG BID : COVER-YES CO-PAY- $9.85, TIER 3 DRUG , P/A -NO / APIXABAN: NON-FORMULARY    Memory Argue Phone Number: 05/10/2021, 11:09 AM

## 2021-05-11 ENCOUNTER — Telehealth: Payer: Self-pay

## 2021-05-11 ENCOUNTER — Telehealth: Payer: Self-pay | Admitting: Internal Medicine

## 2021-05-11 NOTE — Telephone Encounter (Signed)
Transition Care Management Unsuccessful Follow-up Telephone Call  Date of discharge and from where:  05/10/2021 from [facility]  Attempts:  1st Attempt  Reason for unsuccessful TCM follow-up call:  Left voice message

## 2021-05-11 NOTE — Chronic Care Management (AMB) (Signed)
  Chronic Care Management   Outreach Note  05/11/2021 Name: [name] MRN: [id number] DOB: 02/16/1956  Referred by: [name], MD Reason for referral : No chief complaint on file.   An unsuccessful telephone outreach was attempted today. The patient was referred to the pharmacist for assistance with care management and care coordination.   Follow Up Plan:   Lauretta Grill Upstream Scheduler

## 2021-05-15 ENCOUNTER — Other Ambulatory Visit (HOSPITAL_COMMUNITY): Payer: Self-pay | Admitting: Cardiology

## 2021-05-15 ENCOUNTER — Telehealth (HOSPITAL_COMMUNITY): Payer: Self-pay

## 2021-05-15 ENCOUNTER — Other Ambulatory Visit: Payer: Self-pay | Admitting: Internal Medicine

## 2021-05-15 ENCOUNTER — Other Ambulatory Visit (HOSPITAL_COMMUNITY): Payer: Self-pay

## 2021-05-15 DIAGNOSIS — L308 Other specified dermatitis: Secondary | ICD-10-CM

## 2021-05-15 NOTE — Progress Notes (Signed)
[place] Phone: [phone] Subjective:   [name], am serving as a scribe for Dr. Hulan Saas. This visit occurred during the SARS-CoV-2 public health emergency.  Safety protocols were in place, including screening questions prior to the visit, additional usage of staff PPE, and extensive cleaning of exam room while observing appropriate contact time as indicated for disinfecting solutions.   I'm seeing this patient by the request  of:  [name], MD  CC: bilateral shoulder and left knee pain   [id number]  03/21/2021 Repeat injection given again today.  Tolerated the procedure well.  Discussed icing regimen and home exercises, discussed avoiding certain activities.  Follow-up with me again 8 to 10 weeks patient has been referred for potential surgical intervention.  Patient primary care is giving most of the clearance but does need clearance from cardiology.  Discussed icing regimen and home exercises.  Discussed which activities to do which wants to avoid.  Increase activity slowly.  Follow-up with me again in 8 to 10 weeks.  Patient would be a candidate potentially for a replacement but at the moment needs his left knee replaced first.  Update 05/16/2021 [name] is a 65 y.o. male coming in with complaint of B shoulder and L knee pain. Patient states that Dr. Rush Farmer is going to replace the L knee but patient was in ICU for a few days recently.  Did read patient's discharge summary  Both shoulders continue to bother him. Unable to raise arms overhead due to pain.    See's Dr. [name] Thursday and Dr. [name] tomorrow.   Reviewed patient's chart and patient did have a gastrointestinal bleeding.  Need blood transfusion.  Seem to be more of a polyp.  Patient is going to be having a further work-up in an outpatient setting.    Past Medical History:  Diagnosis Date   AICD (automatic  cardioverter/defibrillator) present    Anemia    supposed to be taking Vit B but doesn't   ANXIETY    takes Xanax nightly   Arthritis    Asthma    Albuterol prn and Advair daily;also takes Prednisone daily   Atrial fibrillation ([place]) 09/06/2015   Cardiomyopathy ([place])    a. EF 25% TEE July 2013; b. EF normalized 2015;  c. 03/2015 Echo: EF 40-45%, difrf HK, PASP 38 mmHg, Mild MR, sev LAE/RAE.   CHF (congestive heart failure) (HCC)    Chronic constipation    takes OTC stool softener   COPD (chronic obstructive pulmonary disease) (HCC)    "one dr says COPD; one dr says emphysema" (09/18/2017)   DEPRESSION    takes Zoloft and Doxepin daily   Diverticulitis    DYSKINESIA, ESOPHAGUS    Emphysema of lung ([place])    "one dr says COPD; one dr says emphysema" (09/18/2017)   Essential hypertension        FIBROMYALGIA    GERD (gastroesophageal reflux disease)        Glaucoma    HYPERLIPIDEMIA    a. Intolerant to statins.   INSOMNIA    takes Ambien nightly   Myocardial infarction [facility])    a. 2012 Myoview notable for prior infarct;  b. 03/2015 Lexiscan CL: EF 37%, diff HK, small area of inferior infarct from apex to base-->Med Rx.   Myocardial infarction ([place])    O2 dependent    "2.5L q hs & prn" (09/18/2017)   Paroxysmal atrial fibrillation ([place])  a. CHA2DS2VASc = 3--> takes Coumadin;  b. 03/15/2015 Successful TEE/DCCV;  c. 03/2015 recurrent afib, Amio d/c'd in setting of hyperthyroidism.   Peripheral neuropathy    Pneumonia 12/2016   Rash and other nonspecific skin eruption 04/12/2009   [name] cause found saw dermatologists x 2 and allergist   SLEEP APNEA, OBSTRUCTIVE    a. doesn't use CPAP   Syncope    a. 03/2015 s/p MDT LINQ.   Type II diabetes mellitus ([employer])        Past Surgical History:  Procedure Laterality Date   ACNE CYST REMOVAL     2 on back    AV NODE ABLATION N/A 10/25/2017   Procedure: AV NODE ABLATION;  Surgeon: Deboraha Sprang, MD;  Location: [place] [facility];  Service:  Cardiovascular;  Laterality: N/A;   BIV ICD INSERTION CRT-D N/A 09/18/2017   Procedure: BIV ICD INSERTION CRT-D;  Surgeon: Deboraha Sprang, MD;  Location: [place] [facility];  Service: Cardiovascular;  Laterality: N/A;   CARDIAC CATHETERIZATION N/A 03/21/2016   Procedure: Right/Left Heart Cath and Coronary Angiography;  Surgeon: Larey Dresser, MD;  Location: [place] [facility];  Service: Cardiovascular;  Laterality: N/A;   CARDIOVERSION  04/18/2012   Procedure: CARDIOVERSION;  Surgeon: Fay Records, MD;  Location: [place];  Service: Cardiovascular;  Laterality: N/A;   CARDIOVERSION  04/25/2012   Procedure: CARDIOVERSION;  Surgeon: Thayer Headings, MD;  Location: [place];  Service: Cardiovascular;  Laterality: N/A;   CARDIOVERSION  04/25/2012   Procedure: CARDIOVERSION;  Surgeon: Fay Records, MD;  Location: [place];  Service: Cardiovascular;  Laterality: N/A;   CARDIOVERSION  05/09/2012   Procedure: CARDIOVERSION;  Surgeon: Sherren Mocha, MD;  Location: [place];  Service: Cardiovascular;  Laterality: N/A;  changed from crenshaw to cooper by trish/leone-endo   CARDIOVERSION N/A 03/15/2015   Procedure: CARDIOVERSION;  Surgeon: Thayer Headings, MD;  Location: [place];  Service: Cardiovascular;  Laterality: N/A;   COLONOSCOPY     COLONOSCOPY WITH PROPOFOL N/A 10/21/2014   Procedure: COLONOSCOPY WITH PROPOFOL;  Surgeon: Ladene Artist, MD;  Location: [facility];  Service: Endoscopy;  Laterality: N/A;   EP IMPLANTABLE DEVICE N/A 04/06/2015   Procedure: Loop Recorder Insertion;  Surgeon: Evans Lance, MD;  Location: [place] [facility];  Service: Cardiovascular;  Laterality: N/A;   ESOPHAGOGASTRODUODENOSCOPY     JOINT REPLACEMENT     LOOP RECORDER REMOVAL N/A 09/18/2017   Procedure: LOOP RECORDER REMOVAL;  Surgeon: Deboraha Sprang, MD;  Location: [place] [facility];  Service: Cardiovascular;  Laterality: N/A;   RIGHT/LEFT HEART CATH AND CORONARY ANGIOGRAPHY N/A 01/28/2017   Procedure: Right/Left Heart  Cath and Coronary Angiography;  Surgeon: Larey Dresser, MD;  Location: [place] [facility];  Service: Cardiovascular;  Laterality: N/A;   TEE WITHOUT CARDIOVERSION  04/25/2012   Procedure: TRANSESOPHAGEAL ECHOCARDIOGRAM (TEE);  Surgeon: Thayer Headings, MD;  Location: [place];  Service: Cardiovascular;  Laterality: N/A;   TEE WITHOUT CARDIOVERSION N/A 03/15/2015   Procedure: TRANSESOPHAGEAL ECHOCARDIOGRAM (TEE);  Surgeon: Thayer Headings, MD;  Location: [place];  Service: Cardiovascular;  Laterality: N/A;   TONSILLECTOMY AND ADENOIDECTOMY     TOTAL KNEE ARTHROPLASTY Right 06/15/2014   Procedure: TOTAL KNEE ARTHROPLASTY;  Surgeon: Renette Butters, MD;  Location: [place];  Service: Orthopedics;  Laterality: Right;   Social History   Socioeconomic History   Marital status: Divorced    Spouse name: Not on file  Number of children: 2   Years of education: Not on file   Highest education level: Not on file  Occupational History   Occupation: retired/disabled. prev worked in Therapist, sports.    Employer: DISABLED  Tobacco Use   Smoking status: Former    Packs/day: 2.00    Years: 30.00    Pack years: 60.00    Types: Cigarettes    Quit date: 10/16/2007    Years since quitting: 13.5   Smokeless tobacco: Never  Vaping Use   Vaping Use: Never used  Substance and Sexual Activity   Alcohol use: [name]   Drug use: [name]   Sexual activity: Not Currently  Other Topics Concern   Not on file  Social History Narrative   Lives alone.   Social Determinants of Health   Financial Resource Strain: Low Risk    Difficulty of Paying Living Expenses: Not hard at all  Food Insecurity: [name] Food Insecurity   Worried About Charity fundraiser in the Last Year: Never true   [place] in the Last Year: Never true  Transportation Needs: [name] Transportation Needs   Lack of Transportation (Medical): [name]   Lack of Transportation (Non-Medical): [name]  Physical Activity: Inactive   Days of Exercise per  Week: 0 days   Minutes of Exercise per Session: 0 min  Stress: [name] Stress Concern Present   Feeling of Stress : Not at all  Social Connections: Moderately Integrated   Frequency of Communication with Friends and Family: More than three times a week   Frequency of Social Gatherings with Friends and Family: More than three times a week   Attends Religious Services: More than 4 times per year   Active Member of [employer] or Organizations: Yes   Attends Music therapist: More than 4 times per year   Marital Status: Never married   Allergies  Allergen Reactions   Amiodarone Other (See Comments)    hyperthyroidism   Statins Other (See Comments)    myalgia   Tape Other (See Comments)    Skin Tears Use Paper Tape Only   Family History  Problem Relation Age of Onset   COPD Mother    Asthma Mother    Colon polyps Mother    Allergies Mother    Hypothyroidism Mother    Asthma Maternal Grandmother    Colon cancer Neg Hx     Current Outpatient Medications (Endocrine & Metabolic):    dapagliflozin propanediol (FARXIGA) 10 MG TABS tablet, Take 1 tablet (10 mg total) by mouth daily before breakfast.  Current Outpatient Medications (Cardiovascular):    carvedilol (COREG) 12.5 MG tablet, Take 1 tablet (12.5 mg total) by mouth 2 (two) times daily with a meal.   digoxin (LANOXIN) 0.25 MG tablet, TAKE 1/2 TABLET BY MOUTH EVERY DAY (Patient taking differently: Take 0.125 mg by mouth daily.)   Evolocumab (REPATHA SURECLICK) [id number] MG/ML SOAJ, Inject 1 pen into the skin every 14 (fourteen) days.   sacubitril-valsartan (ENTRESTO) 49-51 MG, Take 0.5 tablets by mouth 2 (two) times daily.   spironolactone (ALDACTONE) 25 MG tablet, TAKE 1 TABLET BY MOUTH EVERY DAY (Patient taking differently: Take 25 mg by mouth daily.)   torsemide (DEMADEX) 20 MG tablet, TAKE 4 TABLETS (80 MG TOTAL) BY MOUTH 2 (TWO) TIMES DAILY. (Patient taking differently: Take 80 mg by mouth in the morning, at noon, in the  evening, and at bedtime.)  Current Outpatient Medications (Respiratory):    albuterol (VENTOLIN HFA) 108 (90  Base) MCG/ACT inhaler, INHALE 2 PUFFS 4 TIMES A DAY as needed (Patient taking differently: Inhale 2 puffs into the lungs 4 (four) times daily as needed for shortness of breath. INHALE 2 PUFFS 4 TIMES A DAY as needed)   fluticasone (FLONASE) 50 MCG/ACT nasal spray, PLACE 2 SPRAYS INTO BOTH NOSTRILS DAILY AS NEEDED FOR ALLERGIES OR RHINITIS.  Current Outpatient Medications (Analgesics):    acetaminophen (TYLENOL) 500 MG tablet, Take 500 mg by mouth every 6 (six) hours as needed.   traMADol (ULTRAM) 50 MG tablet, TAKE 1 TABLET BY MOUTH DAILY AS NEEDED. (Patient taking differently: Take 50 mg by mouth daily as needed for moderate pain.)  Current Outpatient Medications (Hematological):    apixaban (ELIQUIS) 5 MG TABS tablet, Take 1 tablet (5 mg total) by mouth 2 (two) times daily.  Current Outpatient Medications (Other):    ALPRAZolam (XANAX) 0.5 MG tablet, TAKE 2 TABLETS BY MOUTH AT BEDTIME (Patient taking differently: Take 1 mg by mouth at bedtime.)   AMBULATORY NON FORMULARY MEDICATION, Rolling walker with a bench   amoxicillin-clavulanate (AUGMENTIN) 875-125 MG tablet, Take 1 tablet by mouth every 12 (twelve) hours for 6 days.   augmented betamethasone dipropionate (DIPROLENE-AF) 0.05 % cream, Apply 1 application topically daily.   carisoprodol (SOMA) 350 MG tablet, TAKE 1 TABLET BY MOUTH THREE TIMES A DAY AS NEEDED FOR MUSCLE SPASMS (Patient taking differently: Take 350 mg by mouth 3 (three) times daily as needed for muscle spasms.)   Cholecalciferol 100 MCG (4000 UT) CAPS, 1 tab by mouth once daily (Patient taking differently: Take 1 capsule by mouth daily.)   doxepin (SINEQUAN) 10 MG capsule, TAKE 2 CAPSULES (20 MG TOTAL) BY MOUTH AT BEDTIME AS NEEDED.   Dupilumab (DUPIXENT) 300 MG/2ML SOPN, Inject 1 Dose into the skin every 14 (fourteen) days.   hydrOXYzine (ATARAX/VISTARIL) 10 MG  tablet, TAKE 1 TABLET BY MOUTH 3 TIMES DAILY AS NEEDED FOR ITCHING. (Patient taking differently: Take 10 mg by mouth 3 (three) times daily as needed for itching.)   lidocaine (LIDODERM) 5 %, APPLY 1 PATCH TO AFFECTED AREA FOR UP TO 12 HOURS . DO NOT USE MORE THAN ONE PATCH IN 24 HOURS. (Patient taking differently: Place 1 patch onto the skin daily.)   omeprazole (PRILOSEC) 20 MG capsule, TAKE 1 CAPSULE BY MOUTH TWICE A DAY (Patient taking differently: Take 20 mg by mouth 2 (two) times daily before a meal.)   potassium chloride SA (KLOR-CON) 20 MEQ tablet, Take 1 tablet (20 mEq total) by mouth daily.   tamsulosin (FLOMAX) 0.4 MG CAPS capsule, Take 0.4 mg by mouth daily.   traZODone (DESYREL) 100 MG tablet, TAKE 1 TABLET BY MOUTH EVERY DAY AT BEDTIME AS NEEDED   TUMS 500 MG chewable tablet, Chew 500-2,000 mg by mouth every 4 (four) hours as needed for indigestion or heartburn.    venlafaxine XR (EFFEXOR-XR) 75 MG 24 hr capsule, TAKE 1 CAPSULE BY MOUTH DAILY WITH BREAKFAST. (Patient taking differently: Take 75 mg by mouth daily with breakfast.)   zolpidem (AMBIEN) 10 MG tablet, TAKE 1 TABLET BY MOUTH AT BEDTIME AS NEEDED (Patient taking differently: Take 10 mg by mouth at bedtime as needed for sleep.)   Reviewed prior external information including notes and imaging from  primary care provider As well as notes that were available from care everywhere and other healthcare systems.  Past medical history, social, surgical and family history all reviewed in electronic medical record.  [name] pertanent information unless stated regarding to the  chief complaint.   Review of Systems:  [name] headache, visual changes, nausea, vomiting, diarrhea, constipation, dizziness, abdominal pain, skin rash, fevers, chills, night sweats, weight loss, swollen lymph nodes, body aches, joint swelling, chest pain, shortness of breath, mood changes. POSITIVE muscle aches  Objective  There were [name] vitals taken for this visit.    General: [name] apparent distress alert and oriented x3 mood and affect normal, dressed appropriately.  HEENT: Pupils equal, extraocular movements intact  Respiratory: Patient's speak in full sentences and does not appear short of breath  Cardiovascular: [name] lower extremity edema, non tender, [name] erythema  Gait severely antalgic favoring the left knee.  Using 2 canes to help with ambulation. MSK: Left knee has arthritic changes noted.  Trace effusion noted.  Bilateral shoulder show significant arthritic changes with crepitus noted.  Patient has some weakness noted about shoulders for right greater than left.  After informed written and verbal consent, patient was seated on exam table. Left knee was prepped with alcohol swab and utilizing anterolateral approach, patient's left knee space was injected with 4:1  marcaine 0.5%: Kenalog '40mg'$ /dL. Patient tolerated the procedure well without immediate complications.  Procedure: Real-time Ultrasound Guided Injection of right glenohumeral joint Device: GE Logiq Q7  Ultrasound guided injection is preferred based studies that show increased duration, increased effect, greater accuracy, decreased procedural pain, increased response rate with ultrasound guided versus blind injection.  Verbal informed consent obtained.  Time-out conducted.  Noted [name] overlying erythema, induration, or other signs of local infection.  Skin prepped in a sterile fashion.  Local anesthesia: Topical Ethyl chloride.  With sterile technique and under real time ultrasound guidance:  Joint visualized.  23g 1  inch needle inserted posterior approach. Pictures taken for needle placement. Patient did have injection of 2 cc of 1% lidocaine, 2 cc of 0.5% Marcaine, and 1.0 cc of Kenalog 40 mg/dL. Completed without difficulty  Pain immediately resolved suggesting accurate placement of the medication.  Advised to call if fevers/chills, erythema, induration, drainage, or persistent bleeding.   Impression: Technically successful ultrasound guided injection.      Impression and Recommendations:     The above documentation has been reviewed and is accurate and complete Lyndal Pulley, DO

## 2021-05-15 NOTE — Telephone Encounter (Signed)
Transitions of Care Pharmacy   Call attempted for a pharmacy transitions of care follow-up. Unable to leave voicemail.  Call attempt #1. Will follow-up in 2-3 days.    

## 2021-05-16 ENCOUNTER — Ambulatory Visit: Payer: Medicare HMO | Admitting: Family Medicine

## 2021-05-16 ENCOUNTER — Telehealth (HOSPITAL_COMMUNITY): Payer: Self-pay

## 2021-05-16 ENCOUNTER — Ambulatory Visit: Payer: Self-pay

## 2021-05-16 ENCOUNTER — Other Ambulatory Visit: Payer: Self-pay

## 2021-05-16 ENCOUNTER — Encounter: Payer: Self-pay | Admitting: Family Medicine

## 2021-05-16 ENCOUNTER — Ambulatory Visit: Payer: Medicare HMO

## 2021-05-16 VITALS — BP 120/78 | HR 74 | Ht 70.0 in | Wt 259.0 lb

## 2021-05-16 DIAGNOSIS — K5793 Diverticulitis of intestine, part unspecified, without perforation or abscess with bleeding: Secondary | ICD-10-CM

## 2021-05-16 DIAGNOSIS — M25511 Pain in right shoulder: Secondary | ICD-10-CM

## 2021-05-16 DIAGNOSIS — M12811 Other specific arthropathies, not elsewhere classified, right shoulder: Secondary | ICD-10-CM | POA: Diagnosis not present

## 2021-05-16 DIAGNOSIS — M25512 Pain in left shoulder: Secondary | ICD-10-CM | POA: Diagnosis not present

## 2021-05-16 DIAGNOSIS — M1712 Unilateral primary osteoarthritis, left knee: Secondary | ICD-10-CM

## 2021-05-16 DIAGNOSIS — M75101 Unspecified rotator cuff tear or rupture of right shoulder, not specified as traumatic: Secondary | ICD-10-CM | POA: Diagnosis not present

## 2021-05-16 DIAGNOSIS — G8929 Other chronic pain: Secondary | ICD-10-CM | POA: Diagnosis not present

## 2021-05-16 NOTE — Assessment & Plan Note (Signed)
Severe arthritic changes and injection given today.  Tolerated the procedure well.  Patient hopefully will make significant improvement.  Was to have replacement but does need his other medical problems more stable at this time.

## 2021-05-16 NOTE — Assessment & Plan Note (Signed)
Recent gastric hemorrhage noted the patient was in the hospital.  Patient is still on Eliquis.  Following up with cardiology as well as his primary care physician.  Encouraged to avoid any anti-inflammatories otherwise.  At this point do feel that the intra-articular injections would be the safest but did limited to 2 injections today.  Follow-up again in 4 weeks.

## 2021-05-16 NOTE — Assessment & Plan Note (Signed)
Patient given injection and tolerated procedure well.  Discussed icing regimen and home exercise, discussed avoiding certain activities.  Increase activity slowly.  Follow-up with me again in 6 to 8 weeks.  We will consider possibly repeating on the contralateral side.

## 2021-05-16 NOTE — Patient Instructions (Signed)
Keep appt with other docs Tell them about injections today Watch for other signs of bleeding and seek medical attention immediately if symptoms worsen See me in 4-6 weeks for L shoulder

## 2021-05-16 NOTE — Telephone Encounter (Signed)
Transitions of Care Pharmacy   Call attempted for a pharmacy transitions of care follow-up. HIPAA appropriate voicemail was left with call back information provided.   Call attempt #2. Will follow-up in 2-3 days.    

## 2021-05-17 ENCOUNTER — Encounter (HOSPITAL_COMMUNITY): Payer: Self-pay

## 2021-05-17 ENCOUNTER — Telehealth: Payer: Self-pay

## 2021-05-17 ENCOUNTER — Telehealth (HOSPITAL_COMMUNITY): Payer: Self-pay

## 2021-05-17 ENCOUNTER — Ambulatory Visit (HOSPITAL_COMMUNITY)
Admission: RE | Admit: 2021-05-17 | Discharge: 2021-05-17 | Disposition: A | Discharge status: Home / Self Care | Payer: Medicare HMO | Source: Ambulatory Visit | Attending: Family Medicine | Admitting: Family Medicine

## 2021-05-17 VITALS — BP 104/62 | HR 85 | Wt 251.2 lb

## 2021-05-17 DIAGNOSIS — I2510 Atherosclerotic heart disease of native coronary artery without angina pectoris: Secondary | ICD-10-CM | POA: Diagnosis not present

## 2021-05-17 DIAGNOSIS — R21 Rash and other nonspecific skin eruption: Secondary | ICD-10-CM | POA: Insufficient documentation

## 2021-05-17 DIAGNOSIS — R7989 Other specified abnormal findings of blood chemistry: Secondary | ICD-10-CM

## 2021-05-17 DIAGNOSIS — E119 Type 2 diabetes mellitus without complications: Secondary | ICD-10-CM

## 2021-05-17 DIAGNOSIS — I5023 Acute on chronic systolic (congestive) heart failure: Secondary | ICD-10-CM | POA: Diagnosis not present

## 2021-05-17 DIAGNOSIS — I442 Atrioventricular block, complete: Secondary | ICD-10-CM | POA: Insufficient documentation

## 2021-05-17 DIAGNOSIS — I428 Other cardiomyopathies: Secondary | ICD-10-CM | POA: Insufficient documentation

## 2021-05-17 DIAGNOSIS — I4819 Other persistent atrial fibrillation: Secondary | ICD-10-CM

## 2021-05-17 DIAGNOSIS — G4733 Obstructive sleep apnea (adult) (pediatric): Secondary | ICD-10-CM | POA: Diagnosis not present

## 2021-05-17 DIAGNOSIS — Z87891 Personal history of nicotine dependence: Secondary | ICD-10-CM | POA: Insufficient documentation

## 2021-05-17 DIAGNOSIS — Z8616 Personal history of COVID-19: Secondary | ICD-10-CM | POA: Diagnosis not present

## 2021-05-17 DIAGNOSIS — G4739 Other sleep apnea: Secondary | ICD-10-CM

## 2021-05-17 DIAGNOSIS — I5030 Unspecified diastolic (congestive) heart failure: Secondary | ICD-10-CM

## 2021-05-17 DIAGNOSIS — Z79899 Other long term (current) drug therapy: Secondary | ICD-10-CM | POA: Insufficient documentation

## 2021-05-17 DIAGNOSIS — I110 Hypertensive heart disease with heart failure: Secondary | ICD-10-CM | POA: Diagnosis not present

## 2021-05-17 DIAGNOSIS — I4821 Permanent atrial fibrillation: Secondary | ICD-10-CM | POA: Insufficient documentation

## 2021-05-17 DIAGNOSIS — G894 Chronic pain syndrome: Secondary | ICD-10-CM

## 2021-05-17 DIAGNOSIS — E781 Pure hyperglyceridemia: Secondary | ICD-10-CM | POA: Diagnosis not present

## 2021-05-17 DIAGNOSIS — J449 Chronic obstructive pulmonary disease, unspecified: Secondary | ICD-10-CM | POA: Diagnosis not present

## 2021-05-17 DIAGNOSIS — Z7901 Long term (current) use of anticoagulants: Secondary | ICD-10-CM | POA: Insufficient documentation

## 2021-05-17 DIAGNOSIS — Z95810 Presence of automatic (implantable) cardiac defibrillator: Secondary | ICD-10-CM | POA: Insufficient documentation

## 2021-05-17 DIAGNOSIS — Z9981 Dependence on supplemental oxygen: Secondary | ICD-10-CM | POA: Diagnosis not present

## 2021-05-17 DIAGNOSIS — M1712 Unilateral primary osteoarthritis, left knee: Secondary | ICD-10-CM | POA: Diagnosis not present

## 2021-05-17 LAB — CBC
HCT: 30.6 % — ABNORMAL LOW (ref 39.0–52.0)
Hemoglobin: 9.6 g/dL — ABNORMAL LOW (ref 13.0–17.0)
MCH: 29.1 pg (ref 26.0–34.0)
MCHC: 31.4 g/dL (ref 30.0–36.0)
MCV: 92.7 fL (ref 80.0–100.0)
Platelets: 288 10*3/uL (ref 150–400)
RBC: 3.3 MIL/uL — ABNORMAL LOW (ref 4.22–5.81)
RDW: 16.3 % — ABNORMAL HIGH (ref 11.5–15.5)
WBC: 7.9 10*3/uL (ref 4.0–10.5)
nRBC: 0 % (ref 0.0–0.2)

## 2021-05-17 LAB — BASIC METABOLIC PANEL
Anion gap: 7 (ref 5–15)
BUN: 14 mg/dL (ref 8–23)
CO2: 29 mmol/L (ref 22–32)
Calcium: 8.6 mg/dL — ABNORMAL LOW (ref 8.9–10.3)
Chloride: 99 mmol/L (ref 98–111)
Creatinine, Ser: 1.11 mg/dL (ref 0.61–1.24)
GFR, Estimated: >60 mL/min (ref >60)
Glucose, Bld: 94 mg/dL (ref 70–99)
Potassium: 4 mmol/L (ref 3.5–5.1)
Sodium: 135 mmol/L (ref 135–145)

## 2021-05-17 LAB — DIGOXIN LEVEL: Digoxin Level: 0.3 ng/mL — ABNORMAL LOW (ref 0.8–2.0)

## 2021-05-17 LAB — BRAIN NATRIURETIC PEPTIDE: B Natriuretic Peptide: 89.7 pg/mL (ref 0.0–100.0)

## 2021-05-17 MED ORDER — ENTRESTO 24-26 MG PO TABS: 1.0000 | ORAL_TABLET | Freq: Two times a day (BID) | ORAL | 11 refills | Status: DC

## 2021-05-17 MED ORDER — METOLAZONE 2.5 MG PO TABS: 2.5000 mg | ORAL_TABLET | ORAL | 0 refills | Status: DC

## 2021-05-17 NOTE — Progress Notes (Signed)
ID:  [name], DOB 01/12/1956, MRN [id number]  Provider location:  Advanced Heart Failure Type of Visit: Established patient  PCP:  [name], MD  Cardiologist:  Dr. [name]   History of Present Illness: [name] is a 65 y.o. male who has history of COPD on home oxygen, permanent atrial fibrillation, and chronic systolic CHF.  Amiodarone and DCCV were tried for atrial fibrillation in the past without success.   Patient has been noted to have a low EF since 2013.  EF improved by to normal by 2015 but dropped to 30-35% by 11/16.  Cath in 6/17 showed some CAD but not enough to cause cardiomyopathy.  He was admitted in 3/18 with PNA, atrial fibrillation/RVR, and acute on chronic systolic CHF.  Echo in 3/18 showed fall in EF to 10-15%.   I took him for right and left heart cath in 4/18, which showed nonobstructive CAD and relatively optimized filling pressures.    Syncope in 11/18. Loop recorder showed 6 second pause and periods of complete heart block. He had Medtronic BiV ICD placed in 12/18.  In 1/19, he had AV nodal ablation to promote BiV pacing.   Echo was done 6/19 with EF 25-30% with moderate RV dilation/mildly decreased systolic function. CPX 7/19 was submaximal, probably mild functional impairment.    Echo in 1/21 showed EF 35-40% with diffuse hypokinesis, moderate RV dilation with mildly decreased systolic function.   He was admitted with COVID-19 PNA in 2/19.  He had AKI/hypotension and Entresto was stopped.   Echo in 3/22 showed EF 40% with diffuse hypokinesis, mild RV enlargement, mildly decreased RV systolic function.   Presented to [facility] 7/22 with weakness and dizziness. Found to be in hemorrhagic shock from acute GIB. Hgb 5.0, INR 8.4. He was on Coumadin for atrial fibrillation. He was resuscitated with PRBCs, IVF, and given Vitamin K. His Entresto, beta blocker and torsemide were held due to hypotension. He initially required ICU monitoring due to  hypotension requiring pressors.  GI team was consulted and planned to treat with oral antibiotics, for suspected diverticular bleed with plans for outpatient colonoscopy.  Coumadin was transitioned to Eliquis.  He was discharged back on beta blocker, Entresto and SGLT2i. Dig, spiro and torsemide were held at discharge. Weight 259 lbs.  Today he returns for post hospitalization HF follow up. Overall feeling fine. Getting back to the [facility] doing elliptical and light weights. Right leg is still swelling. Denies CP, dizziness, or PND/Orthopnea. Appetite ok. No fever or chills. Weight at home 250 pounds. Taking all medications.   ECG (personally reviewed): Bi-V pacing  ICD interrogation (personally reviewed): Fluid index up, thoracic impedence down, >99% bi-v pacing, no VT/VF, ~1 hr/day activity.  Labs (4/18): pro-BNP 904, K 3.1, creatinine 1.39 => 1.08 with BUN 66 => 37, hgb 14.1.  Labs (5/18): hemoglobin 13.1 Labs (6/18): K 4, creatinine 1.3, BNP 294 Labs (7/18): K 3.8, creatinine 1.23, LDL 96, HDL 42 Labs (8/18): K 4.5, creatinine 1.22 Labs (10/18): digoxin 0.4 Labs (12/18): LDL 115, HDL 37, TGs 214, K 4.2, creatinine 0.84 Labs (1/19): K 4.4, creatinine 0.75, pro-BNP 433 Labs (2/19): digoxin 0.4 Labs (3/19): LDL 121, hgb 12.8, K 4.5, creatinine 0.98 Labs (6/19): LDL 48, HDL 45, K 5, creatinine 0.94, digoxin 0.3 Labs (9/19): K 3.7, creatinine 1.09 => 1.32, digoxin 0.2 Labs (11/19): LDL 115 Labs (12/20): K 3.9, creatinine 0.87, LDL 94  Labs (3/21): K 4, creatinine 1.08 Labs (6/21): LDL 109 Labs (7/21):  K 3.7, creatinine 1.15 Labs (12/21): LDL 129, TGs 239, hgb 12.9, K 3.9, creatinine 0.98, digoxin 0.8 Labs (1/22): ALT 76, AST 40, LDL 162, K 3.8 =>4.6, creatinine 1.16 => 1.12, digoxin 0.7, repeat LFTs normal Labs (5/22): LDL 72, TGs 335, K 4.2, creatinine 1.29 Labs (7/22): K 4.0, creatinine 0.77  PMH: 1. Asthma 2. COPD: On home oxygen. Prior smoker.  3. Atrial fibrillation: Permanent.  He  failed DCCV and amiodarone in the past . 4. HTN 5. GERD 6. Hyperlipidemia: Myalgias with atorvastatin 7. BPH.  8. Chronic systolic CHF: Nonischemic cardiomyopathy.  - EF 25% by TEE in 7/13.  Normalized on 2015 echo. - Echo (11/16): EF 30-35%.  - LHC/RHC (6/17): 80% ostial stenosis small D1, 30-40% pLAD.  Mean RA 4, PA 26/10, mean PCWP 11, CI 2.33.  - Echo (3/18): EF 10-15%, diffuse hypokinesis, severe LV dilation, moderate RV dilation with severely decreased systolic function.  - LHC/RHC (4/18): 40% proximal LAD.  Mean RA 8, PA 37/10 mean 22, mean PCWP 22, LVEDP 9, CI 2.39. - Medtronic BiV ICD placement in 12/18 with AV nodal ablation to promote BiV pacing in 1/19.  - Echo (6/19): EF 25-30% with mild LV dilation, moderately dilated RV with mildly decreased systolic function, IVC not dilated, PASP 23 mmHg.  - CPX (7/19): peak VO2 17.6 (78% predicted), VE/VCO2 slope 31, RER 1.02 => submaximal, probably mild functional impairment.  - Echo (1/21): EF 35-40% with diffuse hypokinesis, moderate RV dilation with mildly decreased systolic function. - Echo (3/22): EF 40% with diffuse hypokinesis, mild RV enlargement, mildly decreased RV systolic function.  9. Morbid obesity 10. ILR in place.  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 Complete heart block: s/p Medtronic CRT-D.  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 OSA: Untreated, unable to tolerate CPAP.  15. COVID-19 PNA 2/21.  16. Hyperlipidemia: Myalgias with multiple statins.  17. PAD: ABIs 10/21 noncompressible on right but TBI normal, normal ABI and TBI on left.   Social History   Socioeconomic History   Marital status: Divorced    Spouse name: Not on file   Number of children: 2   Years of education: Not on file   Highest education level: Not on file  Occupational History   Occupation: retired/disabled. prev worked in Therapist, sports.    Employer: DISABLED  Tobacco Use   Smoking status: Former    Packs/day: 2.00    Years: 30.00    Pack  years: 60.00    Types: Cigarettes    Quit date: 10/16/2007    Years since quitting: 13.5   Smokeless tobacco: Never  Vaping Use   Vaping Use: Never used  Substance and Sexual Activity   Alcohol use: No   Drug use: No   Sexual activity: Not Currently  Other Topics Concern   Not on file  Social History Narrative   Lives alone.   Social Determinants of Health   Financial Resource Strain: Low Risk    Difficulty of Paying Living Expenses: Not hard at all  Food Insecurity: No Food Insecurity   Worried About Charity fundraiser in the Last Year: Never true   [place] in the Last Year: Never true  Transportation Needs: No Transportation Needs   Lack of Transportation (Medical): No   Lack of Transportation (Non-Medical): No  Physical Activity: Inactive   Days of Exercise per Week: 0 days   Minutes of Exercise per Session: 0 min  Stress: No Stress Concern Present   Feeling of Stress :  Not at all  Social Connections: Moderately Integrated   Frequency of Communication with Friends and Family: More than three times a week   Frequency of Social Gatherings with Friends and Family: More than three times a week   Attends Religious Services: More than 4 times per year   Active Member of [employer] or Organizations: Yes   Attends Music therapist: More than 4 times per year   Marital Status: Never married  Human resources officer Violence: Not At Risk   Fear of Current or Ex-Partner: No   Emotionally Abused: No   Physically Abused: No   Sexually Abused: No   Family History  Problem Relation Age of Onset   COPD Mother    Asthma Mother    Colon polyps Mother    Allergies Mother    Hypothyroidism Mother    Asthma Maternal Grandmother    Colon cancer Neg Hx    ROS: All systems reviewed and negative except as per HPI.   Current Outpatient Medications  Medication Sig Dispense Refill   acetaminophen (TYLENOL) 500 MG tablet Take 500 mg by mouth every 6 (six) hours as needed.      albuterol (VENTOLIN HFA) 108 (90 Base) MCG/ACT inhaler INHALE 2 PUFFS 4 TIMES A DAY as needed 54 g 3   ALPRAZolam (XANAX) 0.5 MG tablet TAKE 2 TABLETS BY MOUTH AT BEDTIME 60 tablet 2   AMBULATORY NON FORMULARY MEDICATION Rolling walker with a bench 1 Units 0   apixaban (ELIQUIS) 5 MG TABS tablet Take 1 tablet (5 mg total) by mouth 2 (two) times daily. 60 tablet 0   augmented betamethasone dipropionate (DIPROLENE-AF) 0.05 % cream Apply 1 application topically daily.     carisoprodol (SOMA) 350 MG tablet TAKE 1 TABLET BY MOUTH THREE TIMES A DAY AS NEEDED FOR MUSCLE SPASMS 90 tablet 5   Cholecalciferol 100 MCG (4000 UT) CAPS 1 tab by mouth once daily 30 capsule 99   dapagliflozin propanediol (FARXIGA) 10 MG TABS tablet Take 1 tablet (10 mg total) by mouth daily before breakfast. 30 tablet 11   digoxin (LANOXIN) 0.25 MG tablet TAKE 1/2 TABLET BY MOUTH EVERY DAY 45 tablet 3   doxepin (SINEQUAN) 10 MG capsule TAKE 2 CAPSULES (20 MG TOTAL) BY MOUTH AT BEDTIME AS NEEDED. 180 capsule 1   Dupilumab (DUPIXENT) 300 MG/2ML SOPN Inject 1 Dose into the skin every 14 (fourteen) days.     Evolocumab (REPATHA SURECLICK) [id number] MG/ML SOAJ Inject 1 pen into the skin every 14 (fourteen) days. 6 mL 3   fluticasone (FLONASE) 50 MCG/ACT nasal spray PLACE 2 SPRAYS INTO BOTH NOSTRILS DAILY AS NEEDED FOR ALLERGIES OR RHINITIS. 48 mL 1   hydrOXYzine (ATARAX/VISTARIL) 10 MG tablet TAKE 1 TABLET BY MOUTH 3 TIMES DAILY AS NEEDED FOR ITCHING. 60 tablet 2   lidocaine (LIDODERM) 5 % APPLY 1 PATCH TO AFFECTED AREA FOR UP TO 12 HOURS . DO NOT USE MORE THAN ONE PATCH IN 24 HOURS. 90 patch 2   omeprazole (PRILOSEC) 20 MG capsule TAKE 1 CAPSULE BY MOUTH TWICE A DAY 180 capsule 1   potassium chloride SA (KLOR-CON) 20 MEQ tablet Take 1 tablet (20 mEq total) by mouth daily. 90 tablet 3   sacubitril-valsartan (ENTRESTO) 49-51 MG Take 0.5 tablets by mouth 2 (two) times daily. 180 tablet 3   spironolactone (ALDACTONE) 25 MG tablet TAKE 1 TABLET  BY MOUTH EVERY DAY 90 tablet 3   tamsulosin (FLOMAX) 0.4 MG CAPS capsule Take 0.4 mg by mouth  daily.     torsemide (DEMADEX) 20 MG tablet TAKE 4 TABLETS (80 MG TOTAL) BY MOUTH 2 (TWO) TIMES DAILY. 720 tablet 1   traMADol (ULTRAM) 50 MG tablet TAKE 1 TABLET BY MOUTH DAILY AS NEEDED. 30 tablet 5   traZODone (DESYREL) 100 MG tablet TAKE 1 TABLET BY MOUTH EVERY DAY AT BEDTIME AS NEEDED 90 tablet 1   TUMS 500 MG chewable tablet Chew 500-2,000 mg by mouth every 4 (four) hours as needed for indigestion or heartburn.      venlafaxine XR (EFFEXOR-XR) 75 MG 24 hr capsule TAKE 1 CAPSULE BY MOUTH DAILY WITH BREAKFAST. 90 capsule 1   zolpidem (AMBIEN) 10 MG tablet TAKE 1 TABLET BY MOUTH AT BEDTIME AS NEEDED 90 tablet 1   No current facility-administered medications for this encounter.   Wt Readings from Last 3 Encounters:  05/17/21 113.9 kg (251 lb 3.2 oz)  05/16/21 117.5 kg (259 lb)  05/10/21 115.9 kg (255 lb 8.2 oz)   BP 104/62   Pulse 85   Wt 113.9 kg (251 lb 3.2 oz)   SpO2 95%   BMI 36.04 kg/m   General:  NAD. No resp difficulty, walked in clinic using walker HEENT: Normal Neck: Supple. JVP 9-10. Carotids 2+ bilat; no bruits. No lymphadenopathy or thryomegaly appreciated. Cor: PMI nondisplaced. Regular rate & rhythm. No rubs, gallops or murmurs. Lungs: Clear Abdomen: Obese, nontender, nondistended. No hepatosplenomegaly. No bruits or masses. Good bowel sounds. Extremities: No cyanosis, clubbing, rash, 2+ BLE edema to knees; left knee brace in place. Neuro: Alert & oriented x 3, cranial nerves grossly intact. Moves all 4 extremities w/o difficulty. Affect pleasant.  Assessment/Plan:  1. Atrial fibrillation: Permanent.  Now s/p AV nodal ablation to allow effective BiV pacing.  - Off warfarin with recent GI bleed. - Continue Eliquis. No further bleeding issues. - CBC today. 2. COPD: No longer smoking.  Not using oxygen.  3. Acute on chronic systolic CHF: Nonischemic cardiomyopathy based on  6/17 cath. However, he had a recent significant fall in EF from 30-35% to 10-15% on echo from 3/18.  LHC/RHC in 4/18 showed nonobstructive CAD and relatively optimized filling pressures.  He now has Medtronic CRT-D device with AV nodal ablation to allow BiV pacing.  Echo 6/19 with EF 25-30% with mildly decreased RV systolic function. CPX 7/19 was submaximal but likely only mild functional limitation from CHF.  Echo in 1/21 showed EF 35-40%, echo in 3/22 showed EF up to 40% with mildly decreased RV systolic function.  NYHA class II-III symptoms. He is overloaded on exam and by OptiVol. Likely due to volume resuscitation from recently shock from GIB.  - Take metolazone 2.5 mg x 1 today + 40 KCl - Continue torsemide 80 mg bid. BMET/BNP today, repeat BMET 7 days. - He has been out of carvedilol, will not restart today with low BP and volume up.  - Continue spironolactone 25 mg daily.        - Continue Entresto 24/26 bid.  Will send new Rx, he had been taking 1/2 tablet of 49/51 dose. - Continue Farxiga 10 mg daily.   - Continue digoxin 0.0625 mg daily, check level.  - Will have device RN send transmission in 7 days to follow fluid trend. 4. CAD: Nonobstructive on 4/18 cath.  No exertional chest pain.  - No asa with Eliquis. - Statin myalgias, continue Repatha. Good LDL in 5/22.  - Elevated triglycerides, will give trial of dietary changes but Vascepa would be next  step.  5. Complete heart block: Now has Medtronic CRT-D device and s/p AV nodal ablation.  6. Rash: Spongiotic dermatitis.  Improved.  7. OSA: Unable to tolerate CPAP.   8. Elevated LFTs: Most recent LFTs back to normal.   9. Left knee OA: Needs TKR.  He is stable from a cardiac standpoint.  I think that he could have TKR with moderate but not unacceptable risk of complications.   Followup 3 weeks with APP to assess volume. Consider adding back beta blocker.  Signed, Rafael Bihari, FNP  05/17/2021  [place] [address] [facility] [place] [address] [phone] (office) [phone] (fax)

## 2021-05-17 NOTE — Patient Instructions (Signed)
CHANGE Entresto 24/26 mg, one tab twice a day TAKE Metolazone 2.5 mg today with an additional 40 meq of Potassium today  Labs today We will only contact you if something comes back abnormal or we need to make some changes. Otherwise no news is good news!  Labs needed in 7 days  Your physician recommends that you schedule a follow-up appointment in: 3 weeks  in the Advanced Practitioners (PA/NP) Clinic    Do the following things EVERYDAY: Weigh yourself in the morning before breakfast. Write it down and keep it in a log. Take your medicines as prescribed Eat low salt foods--Limit salt (sodium) to 2000 mg per day.  Stay as active as you can everyday Limit all fluids for the day to less than 2 liters  At the [facility], you and your health needs are our priority. As part of our continuing mission to provide you with exceptional heart care, we have created designated Provider Care Teams. These Care Teams include your primary Cardiologist (physician) and Advanced Practice Providers (APPs- Physician Assistants and Nurse Practitioners) who all work together to provide you with the care you need, when you need it.   You may see any of the following providers on your designated Care Team at your next follow up: Dr Glori Bickers Dr Loralie Champagne Dr [name], NP Lyda Jester, [place] Ginnie Smart Audry Riles, PharmD   Please be sure to bring in all your medications bottles to every appointment.

## 2021-05-17 NOTE — Telephone Encounter (Signed)
Received message from Allena Katz, NP on 8/3 following HF clinic visit and requested to recheck fluid levels next week.  ICM remote transmission scheduled (manual) for 05/23/2021.

## 2021-05-17 NOTE — Telephone Encounter (Signed)
Transitions of Care Pharmacy   Call attempted for a pharmacy transitions of care follow-up. HIPAA appropriate voicemail was left with call back information provided.   Call attempt #3. Will no longer attempt follow up for TOC pharmacy.   

## 2021-05-17 NOTE — Telephone Encounter (Signed)
Spoke with patient.  Advised to send manual remote transmission. Pt currently not at home and staying with a friend after being discharged from hospital.  He has an appt with HF clinic today and fluid levels will be checked at that time. He reports he was hospitalized for severe blood loss and received a lot of IV fluids.  He knows he has a lot of fluid.  Advised will follow up next week for ICM.

## 2021-05-18 ENCOUNTER — Encounter: Payer: Self-pay | Admitting: Internal Medicine

## 2021-05-18 ENCOUNTER — Other Ambulatory Visit: Payer: Self-pay

## 2021-05-18 ENCOUNTER — Ambulatory Visit (INDEPENDENT_AMBULATORY_CARE_PROVIDER_SITE_OTHER): Payer: Medicare HMO | Admitting: Internal Medicine

## 2021-05-18 VITALS — BP 100/74 | HR 77 | Temp 98.3°F | Ht 70.0 in | Wt 255.0 lb

## 2021-05-18 DIAGNOSIS — M79661 Pain in right lower leg: Secondary | ICD-10-CM | POA: Diagnosis not present

## 2021-05-18 DIAGNOSIS — M7989 Other specified soft tissue disorders: Secondary | ICD-10-CM

## 2021-05-18 DIAGNOSIS — I10 Essential (primary) hypertension: Secondary | ICD-10-CM | POA: Diagnosis not present

## 2021-05-18 DIAGNOSIS — E559 Vitamin D deficiency, unspecified: Secondary | ICD-10-CM | POA: Diagnosis not present

## 2021-05-18 NOTE — Assessment & Plan Note (Signed)
Last vitamin D Lab Results  Component Value Date   VD25OH 24.78 (L) 02/28/2021   Low, to start oral replacement

## 2021-05-18 NOTE — Assessment & Plan Note (Signed)
BP Readings from Last 3 Encounters:  05/18/21 100/74  05/17/21 104/62  05/16/21 120/78   Low normal, asympt, pt to continue medical treatment entresto, aldactone, metolozone

## 2021-05-18 NOTE — Assessment & Plan Note (Signed)
Mild to mod, cant r/o dvt as off anticoagulant recently, and though on eliquis if he has DVT may affect timing of colonoscopy- for RLE venous doppler per pt request

## 2021-05-18 NOTE — Patient Instructions (Signed)
You will be contacted regarding the referral for: right leg venous doppler  Please continue all other medications as before, and refills have been done if requested.  Please have the pharmacy call with any other refills you may need.  Please continue your efforts at being more active, low cholesterol diet, and weight control.  Please keep your appointments with your specialists as you may have planned  Please make an Appointment to return in 3 months

## 2021-05-18 NOTE — Progress Notes (Signed)
Remote ICD transmission.   

## 2021-05-18 NOTE — Progress Notes (Signed)
Patient ID: [name], male   DOB: July 07, 1956, 65 y.o.   MRN: [id number]        Chief Complaint: follow up recent hospn       HPI:  [name] is a 65 y.o. male here to f/u after hospn 7/22 - 7/27 admitted for hemorrhagic shock from GI bleed.  Initially went to the ICU due to hypotension requiring pressors.  GI team was consulted.  Plans to treat with oral antibiotics for total of 10 days with outpatient colonoscopy.  Coumadin changed to eliquis.  Has seen advanced HF clinic yesterday with add metolazone and entestro 24-26 bid.  Pt concerned regarding rather large RLE swelling possibly larger than usual with mild discomfort.  Pt denies chest pain, increased sob or doe, wheezing, orthopnea, PND, increased LE swelling, palpitations, dizziness or syncope.  Denies worsening reflux, abd pain, dysphagia, n/v, bowel change or blood.  No further overt bleeding.  Labs yesterday with essentialy stable hgb and renal fxn.  Next HF clinic f/u at 3 wks. Plans also eventual f/u with GI for colonoscopy       Wt Readings from Last 3 Encounters:  05/18/21 255 lb (115.7 kg)  05/17/21 251 lb 3.2 oz (113.9 kg)  05/16/21 259 lb (117.5 kg)   BP Readings from Last 3 Encounters:  05/18/21 100/74  05/17/21 104/62  05/16/21 120/78         Past Medical History:  Diagnosis Date   AICD (automatic cardioverter/defibrillator) present    Anemia    supposed to be taking Vit B but doesn't   ANXIETY    takes Xanax nightly   Arthritis    Asthma    Albuterol prn and Advair daily;also takes Prednisone daily   Atrial fibrillation ([place]) 09/06/2015   Cardiomyopathy ([place])    a. EF 25% TEE July 2013; b. EF normalized 2015;  c. 03/2015 Echo: EF 40-45%, difrf HK, PASP 38 mmHg, Mild MR, sev LAE/RAE.   CHF (congestive heart failure) (HCC)    Chronic constipation    takes OTC stool softener   COPD (chronic obstructive pulmonary disease) (HCC)    "one dr says COPD; one dr says emphysema" (09/18/2017)   DEPRESSION    takes  Zoloft and Doxepin daily   Diverticulitis    DYSKINESIA, ESOPHAGUS    Emphysema of lung ([place])    "one dr says COPD; one dr says emphysema" (09/18/2017)   Essential hypertension        FIBROMYALGIA    GERD (gastroesophageal reflux disease)        Glaucoma    HYPERLIPIDEMIA    a. Intolerant to statins.   INSOMNIA    takes Ambien nightly   Myocardial infarction [facility])    a. 2012 Myoview notable for prior infarct;  b. 03/2015 Lexiscan CL: EF 37%, diff HK, small area of inferior infarct from apex to base-->Med Rx.   Myocardial infarction ([place])    O2 dependent    "2.5L q hs & prn" (09/18/2017)   Paroxysmal atrial fibrillation ([place])    a. CHA2DS2VASc = 3--> takes Coumadin;  b. 03/15/2015 Successful TEE/DCCV;  c. 03/2015 recurrent afib, Amio d/c'd in setting of hyperthyroidism.   Peripheral neuropathy    Pneumonia 12/2016   Rash and other nonspecific skin eruption 04/12/2009   no cause found saw dermatologists x 2 and allergist   SLEEP APNEA, OBSTRUCTIVE    a. doesn't use CPAP   Syncope    a. 03/2015 s/p MDT LINQ.   Type  II diabetes mellitus ([place])        Past Surgical History:  Procedure Laterality Date   ACNE CYST REMOVAL     2 on back    AV NODE ABLATION N/A 10/25/2017   Procedure: AV NODE ABLATION;  Surgeon: Deboraha Sprang, MD;  Location: [place] [facility];  Service: Cardiovascular;  Laterality: N/A;   BIV ICD INSERTION CRT-D N/A 09/18/2017   Procedure: BIV ICD INSERTION CRT-D;  Surgeon: Deboraha Sprang, MD;  Location: [place] [facility];  Service: Cardiovascular;  Laterality: N/A;   CARDIAC CATHETERIZATION N/A 03/21/2016   Procedure: Right/Left Heart Cath and Coronary Angiography;  Surgeon: Larey Dresser, MD;  Location: [place] [facility];  Service: Cardiovascular;  Laterality: N/A;   CARDIOVERSION  04/18/2012   Procedure: CARDIOVERSION;  Surgeon: Fay Records, MD;  Location: [place];  Service: Cardiovascular;  Laterality: N/A;   CARDIOVERSION  04/25/2012   Procedure: CARDIOVERSION;   Surgeon: Thayer Headings, MD;  Location: [place];  Service: Cardiovascular;  Laterality: N/A;   CARDIOVERSION  04/25/2012   Procedure: CARDIOVERSION;  Surgeon: Fay Records, MD;  Location: [place];  Service: Cardiovascular;  Laterality: N/A;   CARDIOVERSION  05/09/2012   Procedure: CARDIOVERSION;  Surgeon: Sherren Mocha, MD;  Location: [place];  Service: Cardiovascular;  Laterality: N/A;  changed from crenshaw to cooper by trish/leone-endo   CARDIOVERSION N/A 03/15/2015   Procedure: CARDIOVERSION;  Surgeon: Thayer Headings, MD;  Location: [place];  Service: Cardiovascular;  Laterality: N/A;   COLONOSCOPY     COLONOSCOPY WITH PROPOFOL N/A 10/21/2014   Procedure: COLONOSCOPY WITH PROPOFOL;  Surgeon: Ladene Artist, MD;  Location: [facility];  Service: Endoscopy;  Laterality: N/A;   EP IMPLANTABLE DEVICE N/A 04/06/2015   Procedure: Loop Recorder Insertion;  Surgeon: Evans Lance, MD;  Location: [place] [facility];  Service: Cardiovascular;  Laterality: N/A;   ESOPHAGOGASTRODUODENOSCOPY     JOINT REPLACEMENT     LOOP RECORDER REMOVAL N/A 09/18/2017   Procedure: LOOP RECORDER REMOVAL;  Surgeon: Deboraha Sprang, MD;  Location: [place] [facility];  Service: Cardiovascular;  Laterality: N/A;   RIGHT/LEFT HEART CATH AND CORONARY ANGIOGRAPHY N/A 01/28/2017   Procedure: Right/Left Heart Cath and Coronary Angiography;  Surgeon: Larey Dresser, MD;  Location: [place] [facility];  Service: Cardiovascular;  Laterality: N/A;   TEE WITHOUT CARDIOVERSION  04/25/2012   Procedure: TRANSESOPHAGEAL ECHOCARDIOGRAM (TEE);  Surgeon: Thayer Headings, MD;  Location: [place];  Service: Cardiovascular;  Laterality: N/A;   TEE WITHOUT CARDIOVERSION N/A 03/15/2015   Procedure: TRANSESOPHAGEAL ECHOCARDIOGRAM (TEE);  Surgeon: Thayer Headings, MD;  Location: [place];  Service: Cardiovascular;  Laterality: N/A;   TONSILLECTOMY AND ADENOIDECTOMY     TOTAL KNEE ARTHROPLASTY Right 06/15/2014   Procedure: TOTAL KNEE  ARTHROPLASTY;  Surgeon: Renette Butters, MD;  Location: [place];  Service: Orthopedics;  Laterality: Right;    reports that he quit smoking about 13 years ago. His smoking use included cigarettes. He has a 60.00 pack-year smoking history. He has never used smokeless tobacco. He reports that he does not drink alcohol and does not use drugs. family history includes Allergies in his mother; Asthma in his maternal grandmother and mother; COPD in his mother; Colon polyps in his mother; Hypothyroidism in his mother. Allergies  Allergen Reactions   Amiodarone Other (See Comments)    hyperthyroidism   Statins Other (See Comments)    myalgia   Tape Other (See Comments)  Skin Tears Use Paper Tape Only   Current Outpatient Medications on File Prior to Visit  Medication Sig Dispense Refill   acetaminophen (TYLENOL) 500 MG tablet Take 500 mg by mouth every 6 (six) hours as needed.     albuterol (VENTOLIN HFA) 108 (90 Base) MCG/ACT inhaler INHALE 2 PUFFS 4 TIMES A DAY as needed 54 g 3   ALPRAZolam (XANAX) 0.5 MG tablet TAKE 2 TABLETS BY MOUTH AT BEDTIME 60 tablet 2   AMBULATORY NON FORMULARY MEDICATION Rolling walker with a bench 1 Units 0   apixaban (ELIQUIS) 5 MG TABS tablet Take 1 tablet (5 mg total) by mouth 2 (two) times daily. 60 tablet 0   augmented betamethasone dipropionate (DIPROLENE-AF) 0.05 % cream Apply 1 application topically daily.     carisoprodol (SOMA) 350 MG tablet TAKE 1 TABLET BY MOUTH THREE TIMES A DAY AS NEEDED FOR MUSCLE SPASMS 90 tablet 5   Cholecalciferol 100 MCG (4000 UT) CAPS 1 tab by mouth once daily 30 capsule 99   dapagliflozin propanediol (FARXIGA) 10 MG TABS tablet Take 1 tablet (10 mg total) by mouth daily before breakfast. 30 tablet 11   digoxin (LANOXIN) 0.25 MG tablet TAKE 1/2 TABLET BY MOUTH EVERY DAY 45 tablet 3   doxepin (SINEQUAN) 10 MG capsule TAKE 2 CAPSULES (20 MG TOTAL) BY MOUTH AT BEDTIME AS NEEDED. 180 capsule 1   Dupilumab (DUPIXENT) 300 MG/2ML SOPN Inject  1 Dose into the skin every 14 (fourteen) days.     Evolocumab (REPATHA SURECLICK) [id number] MG/ML SOAJ Inject 1 pen into the skin every 14 (fourteen) days. 6 mL 3   fluticasone (FLONASE) 50 MCG/ACT nasal spray PLACE 2 SPRAYS INTO BOTH NOSTRILS DAILY AS NEEDED FOR ALLERGIES OR RHINITIS. 48 mL 1   hydrOXYzine (ATARAX/VISTARIL) 10 MG tablet TAKE 1 TABLET BY MOUTH 3 TIMES DAILY AS NEEDED FOR ITCHING. 60 tablet 2   lidocaine (LIDODERM) 5 % APPLY 1 PATCH TO AFFECTED AREA FOR UP TO 12 HOURS . DO NOT USE MORE THAN ONE PATCH IN 24 HOURS. 90 patch 2   metolazone (ZAROXOLYN) 2.5 MG tablet Take 1 tablet (2.5 mg total) by mouth as directed. With additional 40 meq of potassium 5 tablet 0   omeprazole (PRILOSEC) 20 MG capsule TAKE 1 CAPSULE BY MOUTH TWICE A DAY 180 capsule 1   potassium chloride SA (KLOR-CON) 20 MEQ tablet Take 1 tablet (20 mEq total) by mouth daily. 90 tablet 3   sacubitril-valsartan (ENTRESTO) 24-26 MG Take 1 tablet by mouth 2 (two) times daily. 60 tablet 11   spironolactone (ALDACTONE) 25 MG tablet TAKE 1 TABLET BY MOUTH EVERY DAY 90 tablet 3   tamsulosin (FLOMAX) 0.4 MG CAPS capsule Take 0.4 mg by mouth daily.     torsemide (DEMADEX) 20 MG tablet TAKE 4 TABLETS (80 MG TOTAL) BY MOUTH 2 (TWO) TIMES DAILY. 720 tablet 1   traMADol (ULTRAM) 50 MG tablet TAKE 1 TABLET BY MOUTH DAILY AS NEEDED. 30 tablet 5   traZODone (DESYREL) 100 MG tablet TAKE 1 TABLET BY MOUTH EVERY DAY AT BEDTIME AS NEEDED 90 tablet 1   TUMS 500 MG chewable tablet Chew 500-2,000 mg by mouth every 4 (four) hours as needed for indigestion or heartburn.      venlafaxine XR (EFFEXOR-XR) 75 MG 24 hr capsule TAKE 1 CAPSULE BY MOUTH DAILY WITH BREAKFAST. 90 capsule 1   zolpidem (AMBIEN) 10 MG tablet TAKE 1 TABLET BY MOUTH AT BEDTIME AS NEEDED 90 tablet 1   No current  facility-administered medications on file prior to visit.        ROS:  All others reviewed and negative.  Objective        PE:  BP 100/74 (BP Location: Left Arm, Patient  Position: Sitting, Cuff Size: Large)   Pulse 77   Temp 98.3 F (36.8 C) (Oral)   Ht '5\' 10"'$  (1.778 m)   Wt 255 lb (115.7 kg)   SpO2 96%   BMI 36.59 kg/m                 Constitutional: Pt appears in NAD               HENT: Head: NCAT.                Right Ear: External ear normal.                 Left Ear: External ear normal.                Eyes: . Pupils are equal, round, and reactive to light. Conjunctivae and EOM are normal               Nose: without d/c or deformity               Neck: Neck supple. Gross normal ROM               Cardiovascular: Normal rate and regular rhythm.                 Pulmonary/Chest: Effort normal and breath sounds without rales or wheezing.                Abd:  Soft, NT, ND, + BS, no organomegaly               Neurological: Pt is alert. At baseline orientation, motor grossly intact               Skin: Skin is warm. No rashes, no other new lesions, LE edema - 2+RLE, trace only LLE               Psychiatric: Pt behavior is normal without agitation   Micro: none  Cardiac tracings I have personally interpreted today:  none  Pertinent Radiological findings (summarize): none   Lab Results  Component Value Date   WBC 7.9 05/17/2021   HGB 9.6 (L) 05/17/2021   HCT 30.6 (L) 05/17/2021   PLT 288 05/17/2021   GLUCOSE 94 05/17/2021   CHOL 153 02/28/2021   TRIG 335.0 (H) 02/28/2021   HDL 33.60 (L) 02/28/2021   LDLDIRECT 72.0 02/28/2021   LDLCALC 162 (H) 10/20/2020   ALT 15 05/05/2021   AST 17 05/05/2021   NA 135 05/17/2021   K 4.0 05/17/2021   CL 99 05/17/2021   CREATININE 1.11 05/17/2021   BUN 14 05/17/2021   CO2 29 05/17/2021   TSH 1.46 02/28/2021   PSA 2.26 02/28/2021   INR 1.1 05/08/2021   HGBA1C 5.5 05/05/2021   MICROALBUR 0.8 02/28/2021   Assessment/Plan:  [name] is a 65 y.o. White or Caucasian [1] male with  has a past medical history of AICD (automatic cardioverter/defibrillator) present, Anemia, ANXIETY, Arthritis, Asthma,  Atrial fibrillation ([place]) (09/06/2015), Cardiomyopathy ([place]), CHF (congestive heart failure) ([place]), Chronic constipation, COPD (chronic obstructive pulmonary disease) ([place]), DEPRESSION, Diverticulitis, DYSKINESIA, ESOPHAGUS, Emphysema of lung ([place]), Essential hypertension, FIBROMYALGIA, GERD (gastroesophageal reflux disease), Glaucoma, HYPERLIPIDEMIA, INSOMNIA, Myocardial infarction [facility]), Myocardial infarction ([place]), O2 dependent,  Paroxysmal atrial fibrillation ([place]), Peripheral neuropathy, Pneumonia (12/2016), Rash and other nonspecific skin eruption (04/12/2009), SLEEP APNEA, OBSTRUCTIVE, Syncope, and Type II diabetes mellitus ([place]).  Vitamin D deficiency Last vitamin D Lab Results  Component Value Date   VD25OH 24.78 (L) 02/28/2021   Low, to start oral replacement   Pain and swelling of right lower leg Mild to mod, cant r/o dvt as off anticoagulant recently, and though on eliquis if he has DVT may affect timing of colonoscopy- for RLE venous doppler per pt request  Essential hypertension BP Readings from Last 3 Encounters:  05/18/21 100/74  05/17/21 104/62  05/16/21 120/78   Low normal, asympt, pt to continue medical treatment entresto, aldactone, metolozone  Followup: Return in about 3 months (around 08/18/2021).  [name], MD 05/18/2021 2:20 PM [facility]

## 2021-05-22 ENCOUNTER — Telehealth (HOSPITAL_COMMUNITY): Payer: Self-pay | Admitting: *Deleted

## 2021-05-22 ENCOUNTER — Ambulatory Visit (INDEPENDENT_AMBULATORY_CARE_PROVIDER_SITE_OTHER): Payer: Medicare HMO | Admitting: *Deleted

## 2021-05-22 DIAGNOSIS — I4819 Other persistent atrial fibrillation: Secondary | ICD-10-CM

## 2021-05-22 DIAGNOSIS — I255 Ischemic cardiomyopathy: Secondary | ICD-10-CM

## 2021-05-22 NOTE — Patient Instructions (Signed)
Visit Information  [name], it was nice talking with you today.   I look forward to talking to you again for an update on Monday, July 03, 2021 at 1:00 pm - please be listening out for my call that day.  I will call as close to 1:00 pm as possible.   If you need to cancel or re-schedule our telephone visit, please call [phone] and one of our care guides will be happy to assist you.   I look forward to hearing about your progress.   Please don't hesitate to contact me if I can be of assistance to you before our next scheduled telephone appointment.   [name], RN, BSN, [facility] RN Care Coordination- [place] [phone]: direct office [phone]: mobile   PATIENT GOALS:  Goals Addressed             This Visit's Progress    Track and Manage Fluids and Swelling-Heart Failure   On track    Timeframe:  Long-Range Goal Priority:  Medium Start Date:        04/27/21                     Expected End Date:     04/27/22                  Follow Up Date 05/22/21    Continue to weigh yourself daily and write your weights at home down on paper or on the calendar- we will review these each time we have a telephone visit.  Today you reported a weight of 255.2 lbs Call office if I gain more than 2 pounds in one day or 5 pounds in one week, especially if this is associated with chest tightness, swelling in your legs/ ankles, and new or worsened shortness of breath Take your medications as prescribed: today, we verified that you should be taking Eliquis 5 mg twice a day Watch for swelling in feet, ankles and legs every day: contact your cardiologist if you believe your swelling is increasing Keep legs up while sitting Limit the salt in your diet: decrease your salt intake and use salt only in moderation Please read over the attached information- we will review this information periodically during our phone conversations  Why is this important?    It is important to check your weight daily and watch how much salt and liquids you have.  It will help you to manage your heart failure.           Gastrointestinal Bleeding Gastrointestinal (GI) bleeding is bleeding somewhere along the path that food travels through the body (digestive tract). This path is anywhere between the mouth and the opening of the butt (anus). You may have blood in your poop (stool) or have black poop. If you throw up (vomit), there may be blood in it. This condition can be mild, serious, or even life-threatening. If you have alot of bleeding, you may need to stay in the hospital. What are the causes? This condition may be caused by: Irritation and swelling of the esophagus (esophagitis). The esophagus is part of the body that moves food from your mouth to your stomach. Swollen veins in the butt (hemorrhoids). Areas of painful tearing in the opening of the butt (anal fissures). These are often caused by passing hard poop. Pouches that form on the colon over time (diverticulosis). Irritation and swelling (diverticulitis) in areas where pouches have formed on the  colon. Growths (polyps) or cancer. Colon cancer often starts out as growths that are not cancer. Irritation of the stomach lining (gastritis). Sores (ulcers) in the stomach. What increases the risk? You are more likely to develop this condition if you: Have a certain type of infection in your stomach (Helicobacter pylori infection). Take certain medicines. Smoke. Drink alcohol. What are the signs or symptoms? Common symptoms of this condition include: Throwing up (vomiting) material that has bright red blood in it. It may look like coffee grounds. Changes in your poop. The poop may: Have red blood in it. Be black, look like tar, and smell stronger than normal. Be red. Pain or cramping in the belly (abdomen). How is this treated? Treatment for this condition depends on the cause of the bleeding.  For example: Sometimes, the bleeding can be stopped during a procedure that is done to find the problem (endoscopy or colonoscopy). Medicines can be used to: Help control irritation, swelling, or infection. Reduce acid in your stomach. Certain problems can be treated with: Creams. Medicines that are put in the butt (suppositories). Warm baths. Surgery is sometimes needed. If you lose a lot of blood, you may need a blood transfusion. If bleeding is mild, you may be allowed to go home. If there is a lot ofbleeding, you will need to stay in the hospital. Follow these instructions at home:  Take over-the-counter and prescription medicines only as told by your doctor. Eat foods that have a lot of fiber in them. These foods include beans, whole grains, and fresh fruits and vegetables. You can also try eating 1-3 prunes each day. Drink enough fluid to keep your pee (urine) pale yellow. Keep all follow-up visits as told by your doctor. This is important. Contact a doctor if: Your symptoms do not get better. Get help right away if: Your bleeding does not stop. You feel dizzy or you pass out (faint). You feel weak. You have very bad cramps in your back or belly. You pass large clumps of blood (clots) in your poop. Your symptoms are getting worse. You have chest pain or fast heartbeats. Summary GI bleeding is bleeding somewhere along the path that food travels through the body (digestive tract). This bleeding can be caused by many things. Treatment depends on the cause of the bleeding. Take medicines only as told by your doctor. Keep all follow-up visits as told by your doctor. This is important. This information is not intended to replace advice given to you by your health care provider. Make sure you discuss any questions you have with your healthcare provider. Document Revised: 05/14/2018 Document Reviewed: 05/14/2018 Elsevier Patient Education  2022 [place].  Patient verbalizes  understanding of instructions provided today and agrees to view in MyChart Telephone follow up appointment with care management team member scheduled for:  Monday, July 03, 2021 at 1:00 pm The patient has been provided with contact information for the care management team and has been advised to call with any health related questions or concerns

## 2021-05-22 NOTE — Chronic Care Management (AMB) (Signed)
Chronic Care Management   CCM RN Visit Note  05/22/2021 Name: [name] MRN: [id number] DOB: [birth year]  Subjective: [name] is a 65 y.o. year old male who is a primary care patient of [name], MD. The care management team was consulted for assistance with disease management and care coordination needs.    Engaged with patient by telephone for follow up visit in response to provider referral for case management and/or care coordination services.   Consent to Services:  The patient was given information about Chronic Care Management services, agreed to services, and gave verbal consent prior to initiation of services.  Please see initial visit note for detailed documentation.  Patient agreed to services and verbal consent obtained.   Assessment: Review of patient past medical history, allergies, medications, health status, including review of consultants reports, laboratory and other test data, was performed as part of comprehensive evaluation and provision of chronic care management services.   CCM Care Plan  Allergies  Allergen Reactions   Amiodarone Other (See Comments)    hyperthyroidism   Statins Other (See Comments)    myalgia   Tape Other (See Comments)    Skin Tears Use Paper Tape Only   Outpatient Encounter Medications as of 05/22/2021  Medication Sig   acetaminophen (TYLENOL) 500 MG tablet Take 500 mg by mouth every 6 (six) hours as needed.   albuterol (VENTOLIN HFA) 108 (90 Base) MCG/ACT inhaler INHALE 2 PUFFS 4 TIMES A DAY as needed   ALPRAZolam (XANAX) 0.5 MG tablet TAKE 2 TABLETS BY MOUTH AT BEDTIME   AMBULATORY NON FORMULARY MEDICATION Rolling walker with a bench   apixaban (ELIQUIS) 5 MG TABS tablet Take 1 tablet (5 mg total) by mouth 2 (two) times daily.   augmented betamethasone dipropionate (DIPROLENE-AF) 0.05 % cream Apply 1 application topically daily.   carisoprodol (SOMA) 350 MG tablet TAKE 1 TABLET BY MOUTH THREE TIMES A DAY AS NEEDED FOR  MUSCLE SPASMS   Cholecalciferol 100 MCG (4000 UT) CAPS 1 tab by mouth once daily   dapagliflozin propanediol (FARXIGA) 10 MG TABS tablet Take 1 tablet (10 mg total) by mouth daily before breakfast.   digoxin (LANOXIN) 0.25 MG tablet TAKE 1/2 TABLET BY MOUTH EVERY DAY   doxepin (SINEQUAN) 10 MG capsule TAKE 2 CAPSULES (20 MG TOTAL) BY MOUTH AT BEDTIME AS NEEDED.   Dupilumab (DUPIXENT) 300 MG/2ML SOPN Inject 1 Dose into the skin every 14 (fourteen) days.   Evolocumab (REPATHA SURECLICK) [id number] MG/ML SOAJ Inject 1 pen into the skin every 14 (fourteen) days.   fluticasone (FLONASE) 50 MCG/ACT nasal spray PLACE 2 SPRAYS INTO BOTH NOSTRILS DAILY AS NEEDED FOR ALLERGIES OR RHINITIS.   hydrOXYzine (ATARAX/VISTARIL) 10 MG tablet TAKE 1 TABLET BY MOUTH 3 TIMES DAILY AS NEEDED FOR ITCHING.   lidocaine (LIDODERM) 5 % APPLY 1 PATCH TO AFFECTED AREA FOR UP TO 12 HOURS . DO NOT USE MORE THAN ONE PATCH IN 24 HOURS.   metolazone (ZAROXOLYN) 2.5 MG tablet Take 1 tablet (2.5 mg total) by mouth as directed. With additional 40 meq of potassium   omeprazole (PRILOSEC) 20 MG capsule TAKE 1 CAPSULE BY MOUTH TWICE A DAY   potassium chloride SA (KLOR-CON) 20 MEQ tablet Take 1 tablet (20 mEq total) by mouth daily.   sacubitril-valsartan (ENTRESTO) 24-26 MG Take 1 tablet by mouth 2 (two) times daily.   spironolactone (ALDACTONE) 25 MG tablet TAKE 1 TABLET BY MOUTH EVERY DAY   tamsulosin (FLOMAX) 0.4 MG CAPS  capsule Take 0.4 mg by mouth daily.   torsemide (DEMADEX) 20 MG tablet TAKE 4 TABLETS (80 MG TOTAL) BY MOUTH 2 (TWO) TIMES DAILY.   traMADol (ULTRAM) 50 MG tablet TAKE 1 TABLET BY MOUTH DAILY AS NEEDED.   traZODone (DESYREL) 100 MG tablet TAKE 1 TABLET BY MOUTH EVERY DAY AT BEDTIME AS NEEDED   TUMS 500 MG chewable tablet Chew 500-2,000 mg by mouth every 4 (four) hours as needed for indigestion or heartburn.    venlafaxine XR (EFFEXOR-XR) 75 MG 24 hr capsule TAKE 1 CAPSULE BY MOUTH DAILY WITH BREAKFAST.   zolpidem (AMBIEN)  10 MG tablet TAKE 1 TABLET BY MOUTH AT BEDTIME AS NEEDED   No facility-administered encounter medications on file as of 05/22/2021.   Patient Active Problem List   Diagnosis Date Noted   Pain and swelling of right lower leg 05/18/2021   Hemorrhagic shock ([place]) 05/05/2021   Gastrointestinal hemorrhage    Diverticulitis of colon    Preop exam for internal medicine 03/04/2021   B12 deficiency 03/04/2021   Vitamin D deficiency 03/01/2021   Myalgia 12/01/2020   Laceration of left leg 09/17/2020   Statin myopathy 08/02/2020   Rotator cuff arthropathy of both shoulders 02/23/2020   (HFpEF) heart failure with preserved ejection fraction ([place]) 02/22/2020   ICD (implantable cardioverter-defibrillator) in place 12/27/2019   COVID-19 virus infection 12/09/2019   COVID-19 12/09/2019   Hypotension 12/09/2019   Upper respiratory infection 11/27/2019   COPD exacerbation ([place]) 11/27/2019   Elevated PSA 09/23/2019   Lip lesion 12/23/2018   Diabetic ulcer of left lower leg with necrosis of muscle ([place]) 12/05/2018   Chronic bursitis of left shoulder 07/01/2018   Pain due to total knee replacement ([place]) 03/31/2018   Rotator cuff arthropathy, left 02/13/2018   Right rotator cuff tear arthropathy 01/14/2018   Increased prostate specific antigen (PSA) velocity 09/26/2017   Trigger point of thoracic region 07/04/2017   Right lumbar radiculopathy 05/15/2017   Right leg swelling 05/09/2017   Glaucoma 04/18/2017   History of nasal polyposis 02/19/2017   Chronic rhinitis 02/19/2017   Microhematuria 12/04/2016   Hyperlipidemia 08/15/2016   Encounter for well adult exam with abnormal findings 11/29/2015   Type II diabetes mellitus ([place])    Persistent atrial fibrillation ([place]) 03/30/2015   Syncope 03/30/2015   Acute on chronic diastolic heart failure ([place]) 03/30/2015   Former tobacco use 02/25/2015   Type 2 diabetes mellitus ([place]) 02/25/2015   Atrial fibrillation ([place]) 02/25/2015   Anemia 02/23/2015    Thyrotoxicosis 02/23/2015   Hx of adenomatous colonic polyps 10/21/2014   De Quervain's tenosynovitis, right 04/30/2014   Osteoarthritis of right knee 02/19/2014   Encounter for therapeutic drug monitoring 11/17/2013   Thoracic degenerative disc disease 06/17/2013   Thoracic spondylosis without myelopathy 02/11/2013   Lumbosacral spondylosis without myelopathy 02/11/2013   Degenerative joint disease of knee, left 02/11/2013   COPD (chronic obstructive pulmonary disease) ([place]) 08/20/2012   Bundle branch block, right and extreme left anterior fascicular 05/05/2012   Nonischemic cardiomyopathy ([place]) 05/02/2012   Other primary cardiomyopathies 05/02/2012   Anticoagulated on warfarin 03/21/2012   Spongiotic dermatitis 09/26/2011   Past myocardial infarction 04/19/2011   UNSPECIFIED URETHRAL STRICTURE 12/26/2010   Chronic pain syndrome 05/18/2010   Obstructive sleep apnea 10/21/2007   NEPHROLITHIASIS, HX OF 10/21/2007   Anxiety 06/09/2007   GERD (gastroesophageal reflux disease) 06/09/2007   Benign prostatic hyperplasia 06/09/2007   Morbid obesity ([place]) 06/03/2007   Depression 06/03/2007   Essential hypertension 06/03/2007  INSOMNIA 06/03/2007   Conditions to be addressed/monitored:  Atrial Fibrillation and CHF  Care Plan : Heart Failure (Adult)  Updates made by Knox Royalty, RN since 05/22/2021 12:00 AM     Problem: Symptom Exacerbation (Heart Failure)   Priority: Medium     Long-Range Goal: Symptom Exacerbation Prevented or Minimized   Start Date: 04/27/2021  Expected End Date: 04/27/2022  This Visit's Progress: On track  Recent Progress: On track  Priority: Medium  Note:   Current Barriers:  Ongoing need for reinforcement and support in self-health management of heart failure in setting of multiple co-morbidities, including persistent Atrial Fibrillation Fragile state of health, multiple progressing chronic health conditions Recent hospitalization July 22-27, 2022 for GI  bleeding Nurse Case Manager Clinical Goal(s):  Over the next 12 months, patient will verbalize ongoing understanding of Heart Failure and Atrial Fibrillation Action Plan and when to call doctor, as evidenced by patient/ caregiver reporting during CCM RN CM outreach of: Completion of daily weight monitoring/ recording (notifying MD of 3 lb weight gain over night or 5 lb in a week) Adhering to low salt diet Taking prescribed medications as prescribed Interventions:  Collaboration with [name], MD regarding development and update of comprehensive plan of care as evidenced by provider attestation and co-signature Inter-disciplinary care team collaboration (see longitudinal plan of care) Chart reviewed including relevant office notes, upcoming scheduled appointments, and lab results Discussed current  clinical condition with patient- he reports being very weak post-hospital discharge; reports good appetite; denies new/ recent falls; denies changes in baseline chronic pain  Reviewed with patient recent hospitalization for GI bleeding Medication review: reports he is at his girlfriend's home; unable to complete full review; we reviewed medication changes post- recent cardiology provider office visit: patient believes he should be taking Eliquis QD: verified by review of provider note that he should be taking BID: patient verbalizes understanding; states he will begin taking BID Confirmed patient has increased dose of Entresto; able to verbalize appropriate dosing, confirms taking BID Confirmed patient continues taking diuretics as prescribed Confirmed patient does not believe he has been in A-Fib recently Reviewed recent lab work/ CBC values; provided education around normal values, signs/ symptoms ongoing GI bleeding, along with action plan for same Reviewed recent provider office visits with patient: cardiology provider 05/17/21; PCP 05/18/21; wound clinic visits: patient reports wound on (R) leg not  draining; states no follow up appointments necessary per wound clinic team Basic overview and discussion of pathophysiology of/ action plan for heart failure education reinforced: patient verbalizes good general understanding of baseline knowledge around same and could benefit from ongoing reinforcement and support Confirmed patient continues following low salt, heart healthy diet-- reports eating more bland diet post- recent hospitalization Care Coordination telephone outreach placed to [facility] cardiology to assist patient in securing wheelchair for tomorrow's scheduled DVT US doppler studies: on extended hold; with patient's permission/ request, placed his phone number on que for follow up call- encouraged patient to use wheelchair to get to second floor for scheduled appointment, as he reports ongoing issues with weakness, leg swelling; daughter will be taking patient to scheduled appointment- provided patient with phone number to doppler study department Reviewed upcoming provider appointments with patient and confirmed that patient has plans to attend all as scheduled: 05/23/21: DVT [facility] doppler [facility] at [facility]; 05/23/21- podiatry; 06/07/21: GI provider; 06/09/21: CHF clinic; 06/28/21- CHF clinic; daughter will be providing transportation for now due to patient's ongoing weakness post-hospital discharge Discussed plans for CCM  RN CM follow up, scheduled next telephone visit with patient Self-Care Activities:  Takes Heart Failure Medications as prescribed Weighs daily and record (notifying MD of 3 lb weight gain over night or 5 lb in a week) Verbalizes understanding of and follows CHF Action Plan Adheres to low sodium diet  Patient Goals:  Continue to weigh yourself daily and write your weights at home down on paper or on the calendar- we will review these each time we have a telephone visit.  Today you reported a weight of 255.2 lbs Call office if I gain more than 2 pounds in one day or 5 pounds in  one week, especially if this is associated with chest tightness, swelling in your legs/ ankles, and new or worsened shortness of breath Take your medications as prescribed: today, we verified that you should be taking Eliquis 5 mg twice a day Watch for swelling in feet, ankles and legs every day: contact your cardiologist if you believe your swelling is increasing Keep legs up while sitting Limit the salt in your diet: decrease your salt intake and use salt only in moderation Please read over the attached information- we will review this information periodically during our phone conversations Follow Up Plan:  Telephone follow up appointment with care management team member scheduled for:  Monday, July 03, 2021 at 1:00 pm The patient has been provided with contact information for the care management team and has been advised to call with any health related questions or concerns.      Plan: Telephone follow up appointment with care management team member scheduled for:  Monday, July 03, 2021 at 1:00 pm The patient has been provided with contact information for the care management team and has been advised to call with any health related questions or concerns  [name], RN, BSN, [place] [phone]: direct office [phone]: mobile

## 2021-05-22 NOTE — Telephone Encounter (Signed)
Receive fax from [facility], pt needs clearance for L total knee arthroplasyt with a spinal and block, and to hold eliquis prior  Per Dr [name]: Arletha Pili for surgery from a cardiac standpoint, ok to hold eliquis 3 days prior  Note faxed back to them at [phone] atten: Sherrie

## 2021-05-23 ENCOUNTER — Other Ambulatory Visit: Payer: Self-pay

## 2021-05-23 ENCOUNTER — Telehealth: Payer: Self-pay

## 2021-05-23 ENCOUNTER — Ambulatory Visit (INDEPENDENT_AMBULATORY_CARE_PROVIDER_SITE_OTHER): Payer: Medicare HMO

## 2021-05-23 ENCOUNTER — Ambulatory Visit (HOSPITAL_COMMUNITY)
Admission: RE | Admit: 2021-05-23 | Discharge: 2021-05-23 | Disposition: A | Discharge status: Home / Self Care | Payer: Medicare HMO | Source: Ambulatory Visit | Attending: Internal Medicine | Admitting: Internal Medicine

## 2021-05-23 ENCOUNTER — Ambulatory Visit: Payer: Medicare HMO | Admitting: Podiatry

## 2021-05-23 DIAGNOSIS — B351 Tinea unguium: Secondary | ICD-10-CM | POA: Diagnosis not present

## 2021-05-23 DIAGNOSIS — M7989 Other specified soft tissue disorders: Secondary | ICD-10-CM | POA: Diagnosis not present

## 2021-05-23 DIAGNOSIS — M79675 Pain in left toe(s): Secondary | ICD-10-CM

## 2021-05-23 DIAGNOSIS — M79674 Pain in right toe(s): Secondary | ICD-10-CM

## 2021-05-23 DIAGNOSIS — E119 Type 2 diabetes mellitus without complications: Secondary | ICD-10-CM

## 2021-05-23 DIAGNOSIS — Z95810 Presence of automatic (implantable) cardiac defibrillator: Secondary | ICD-10-CM | POA: Diagnosis not present

## 2021-05-23 DIAGNOSIS — M79661 Pain in right lower leg: Secondary | ICD-10-CM | POA: Diagnosis not present

## 2021-05-23 DIAGNOSIS — I5022 Chronic systolic (congestive) heart failure: Secondary | ICD-10-CM

## 2021-05-23 NOTE — Telephone Encounter (Signed)
ICM call to patient.  Requested to send remote transmission for review as requested by Allena Katz, NP at HF clinic after his office visit last week.  He stated he will send one now and advised will review tomorrow and give him a call back.

## 2021-05-24 ENCOUNTER — Encounter: Payer: Self-pay | Admitting: Internal Medicine

## 2021-05-24 ENCOUNTER — Ambulatory Visit (HOSPITAL_COMMUNITY)
Admission: RE | Admit: 2021-05-24 | Discharge: 2021-05-24 | Disposition: A | Discharge status: Home / Self Care | Payer: Medicare HMO | Source: Ambulatory Visit | Attending: Internal Medicine | Admitting: Internal Medicine

## 2021-05-24 DIAGNOSIS — I5030 Unspecified diastolic (congestive) heart failure: Secondary | ICD-10-CM | POA: Insufficient documentation

## 2021-05-24 DIAGNOSIS — G894 Chronic pain syndrome: Secondary | ICD-10-CM | POA: Insufficient documentation

## 2021-05-24 LAB — BASIC METABOLIC PANEL
Anion gap: 4 — ABNORMAL LOW (ref 5–15)
BUN: 14 mg/dL (ref 8–23)
CO2: 23 mmol/L (ref 22–32)
Calcium: 8.5 mg/dL — ABNORMAL LOW (ref 8.9–10.3)
Chloride: 108 mmol/L (ref 98–111)
Creatinine, Ser: 0.95 mg/dL (ref 0.61–1.24)
GFR, Estimated: >60 mL/min (ref >60)
Glucose, Bld: 93 mg/dL (ref 70–99)
Potassium: 4.5 mmol/L (ref 3.5–5.1)
Sodium: 135 mmol/L (ref 135–145)

## 2021-05-24 MED ORDER — KETOCONAZOLE 2 % EX CREA: 1.0000 "application " | TOPICAL_CREAM | Freq: Every day | CUTANEOUS | 1 refills | Status: DC

## 2021-05-24 NOTE — Progress Notes (Signed)
Received: Today [place], Jessica M, FNP  Neftaly Swiss S, RN Thank you Rilla Buckman  

## 2021-05-24 NOTE — Progress Notes (Signed)
  Subjective:  Patient ID: [name], male    DOB: 02-Jul-1956,  MRN: [id number]  Chief Complaint  Patient presents with   Bunions   Ingrown Toenail   Hammer Toe    65 y.o. male returns with the above complaint. History confirmed with patient.  He is doing well he is having his knee and hip dressed  Objective:  Physical Exam: warm, good capillary refill, no trophic changes or ulcerative lesions, normal sensory exam, +2 DP and PT reduced bilateral and venous stasis dermatitis noted.  Moderate to severe hallux abductovalgus with hammertoe contractures 2 through 5 bilaterally, right worse than left.  Summary:  Right: Resting right ankle-brachial index indicates noncompressible right  lower extremity arteries. The right toe-brachial index is normal. ABIs are  unreliable.   Left: Resting left ankle-brachial index is within normal range. No  evidence of significant left lower extremity arterial disease. The left  toe-brachial index is normal.       *See table(s) above for measurements and observations.         Radiographs: X-ray of the right foot: Moderate hallux abductovalgus with the presence of metatarsus adductus deformity and pes planus Assessment:   No diagnosis found.   Plan:  Patient was evaluated and treated and all questions answered.  Patient educated on diabetes. Discussed proper diabetic foot care and discussed risks and complications of disease. Educated patient in depth on reasons to return to the office immediately should he/she discover anything concerning or new on the feet. All questions answered. Discussed proper shoes as well.  Discussed the etiology and treatment options for the condition in detail with the patient. Educated patient on the topical and oral treatment options for mycotic nails. Recommended debridement of the nails today. Sharp and mechanical debridement performed of all painful and mycotic nails today. Nails debrided in length and  thickness using a nail nipper to level of comfort. Discussed treatment options including appropriate shoe gear. Follow up as needed for painful nails.  Ingrowing corners of hallux nails debrided and slant back fashion    Return in about 9 weeks (around 07/25/2021) for at risk diabetic foot care.

## 2021-05-24 NOTE — Progress Notes (Signed)
EPIC Encounter for ICM Monitoring  Patient Name: [name] is a 65 y.o. male Date: 05/24/2021 Primary Care Physican: [name], MD Primary Cardiologist: [name] Electrophysiologist: Vergie Living Pacing: 99.9%    05/24/2022 Weight: 250.6 lbs (close to baseline)     Spoke with patient and heart failure questions reviewed.  Pt reports 5 lb weight loss and leg swelling resolved since taking 1 time Metolazone dose on 05/17/2021.   Optivol thoracic impedance returned to baseline normal after taking metolazone 2.5 mg x 1 + 40 KCl as instructed at 8/3 OV with Allena Katz, NP at HF clinic.     Prescribed:  Torsemide 20 mg to 4 tablets (80 mg total) twice a day.   Potassium 20 mEq take 1 tablet daily Spironolactone 25 mg take 1 tablet daily.   Labs: 05/17/2021 Creatinine 1.11, BUN 14, Potassium 4.0, Sodium 135, GFR >60 05/10/2021 Creatinine 0.77, BUN 7,   Potassium 4.0, Sodium 137, GFR >60  05/09/2021 Creatinine 0.70, BUN 7,   Potassium 3.8, Sodium 139, GFR >60  05/08/2021 Creatinine 0.74, BUN 6,   Potassium 3.5, Sodium 139, GFR >60  05/07/2021 Creatinine 0.75, BUN 8,   Potassium 3.5, Sodium 138, GFR >60  05/06/2021 Creatinine 1.15, BUN 17, Potassium 4.0, Sodium 136, GFR >6  05/05/2021 Creatinine 1.45, BUN 21, Potassium 3.9, Sodium 136, GFR 53  A complete set of results can be found in Results Review.   Recommendations:  Recommendation to limit salt intake to 2000 mg daily and fluid intake to 64 oz daily.  Encouraged to call if experiencing any fluid symptoms.    Follow-up plan: ICM clinic phone appointment on 06/05/2021 to recheck fluid levels.   91 day device clinic remote transmission 07/26/2021   EP/Cardiology Office Visits:  06/09/2021 with [facility] PA/NP.   Recall 02/22/2021 with Dr [name].    Copy of ICM check sent to Dr. [name] and Allena Katz, NP at HF clinic to show effectiveness of Metolazone given last week.   3 month ICM trend: 05/23/2021.    1 Year ICM trend:        [name], RN 05/24/2021 8:17 AM

## 2021-05-25 ENCOUNTER — Encounter: Payer: Self-pay | Admitting: Internal Medicine

## 2021-05-29 ENCOUNTER — Other Ambulatory Visit: Payer: Self-pay | Admitting: Internal Medicine

## 2021-05-30 ENCOUNTER — Other Ambulatory Visit: Payer: Self-pay

## 2021-05-31 ENCOUNTER — Other Ambulatory Visit: Payer: Self-pay | Admitting: Internal Medicine

## 2021-06-03 ENCOUNTER — Other Ambulatory Visit: Payer: Self-pay | Admitting: Internal Medicine

## 2021-06-03 ENCOUNTER — Encounter: Payer: Self-pay | Admitting: Internal Medicine

## 2021-06-05 ENCOUNTER — Ambulatory Visit: Payer: Medicare HMO | Admitting: *Deleted

## 2021-06-05 ENCOUNTER — Encounter: Payer: Self-pay | Admitting: Internal Medicine

## 2021-06-05 ENCOUNTER — Ambulatory Visit (INDEPENDENT_AMBULATORY_CARE_PROVIDER_SITE_OTHER): Payer: Medicare HMO

## 2021-06-05 DIAGNOSIS — I5022 Chronic systolic (congestive) heart failure: Secondary | ICD-10-CM

## 2021-06-05 DIAGNOSIS — I4819 Other persistent atrial fibrillation: Secondary | ICD-10-CM

## 2021-06-05 DIAGNOSIS — Z95810 Presence of automatic (implantable) cardiac defibrillator: Secondary | ICD-10-CM

## 2021-06-05 DIAGNOSIS — I255 Ischemic cardiomyopathy: Secondary | ICD-10-CM

## 2021-06-05 NOTE — Progress Notes (Signed)
Please to make ROV as ? May need antibiotic

## 2021-06-05 NOTE — Chronic Care Management (AMB) (Signed)
Chronic Care Management   CCM RN Visit Note  06/05/2021 Name: [name] MRN: [id number] DOB: 06/18/1956  Subjective: [name] is a 65 y.o. year old male who is a primary care patient of [name], MD. The care management team was consulted for assistance with disease management and care coordination needs.    Engaged with patient by telephone for  acute call  in response to provider referral for case management and/or care coordination services.   Consent to Services:  The patient was given information about Chronic Care Management services, agreed to services, and gave verbal consent prior to initiation of services.  Please see initial visit note for detailed documentation.  Patient agreed to services and verbal consent obtained.   Assessment: Review of patient past medical history, allergies, medications, health status, including review of consultants reports, laboratory and other test data, was performed as part of comprehensive evaluation and provision of chronic care management services.   CCM Care Plan  Allergies  Allergen Reactions   Amiodarone Other (See Comments)    hyperthyroidism   Statins Other (See Comments)    myalgia   Tape Other (See Comments)    Skin Tears Use Paper Tape Only   Outpatient Encounter Medications as of 06/05/2021  Medication Sig   acetaminophen (TYLENOL) 500 MG tablet Take 500 mg by mouth every 6 (six) hours as needed.   albuterol (VENTOLIN HFA) 108 (90 Base) MCG/ACT inhaler INHALE 2 PUFFS 4 TIMES A DAY as needed   ALPRAZolam (XANAX) 0.5 MG tablet TAKE 2 TABLETS BY MOUTH AT BEDTIME   AMBULATORY NON FORMULARY MEDICATION Rolling walker with a bench   apixaban (ELIQUIS) 5 MG TABS tablet Take 1 tablet (5 mg total) by mouth 2 (two) times daily.   augmented betamethasone dipropionate (DIPROLENE-AF) 0.05 % cream Apply 1 application topically daily.   carisoprodol (SOMA) 350 MG tablet TAKE 1 TABLET BY MOUTH THREE TIMES A DAY AS NEEDED FOR MUSCLE  SPASMS   Cholecalciferol 100 MCG (4000 UT) CAPS 1 tab by mouth once daily   dapagliflozin propanediol (FARXIGA) 10 MG TABS tablet Take 1 tablet (10 mg total) by mouth daily before breakfast.   digoxin (LANOXIN) 0.25 MG tablet TAKE 1/2 TABLET BY MOUTH EVERY DAY   doxepin (SINEQUAN) 10 MG capsule TAKE 2 CAPSULES (20 MG TOTAL) BY MOUTH AT BEDTIME AS NEEDED.   Dupilumab (DUPIXENT) 300 MG/2ML SOPN Inject 1 Dose into the skin every 14 (fourteen) days.   Evolocumab (REPATHA SURECLICK) [id number] MG/ML SOAJ Inject 1 pen into the skin every 14 (fourteen) days.   fluticasone (FLONASE) 50 MCG/ACT nasal spray PLACE 2 SPRAYS INTO BOTH NOSTRILS DAILY AS NEEDED FOR ALLERGIES OR RHINITIS.   hydrOXYzine (ATARAX/VISTARIL) 10 MG tablet TAKE 1 TABLET BY MOUTH 3 TIMES DAILY AS NEEDED FOR ITCHING.   ketoconazole (NIZORAL) 2 % cream Apply 1 application topically daily.   lidocaine (LIDODERM) 5 % APPLY 1 PATCH TO AFFECTED AREA FOR UP TO 12 HOURS . DO NOT USE MORE THAN ONE PATCH IN 24 HOURS.   metolazone (ZAROXOLYN) 2.5 MG tablet Take 1 tablet (2.5 mg total) by mouth as directed. With additional 40 meq of potassium   omeprazole (PRILOSEC) 20 MG capsule TAKE 1 CAPSULE BY MOUTH TWICE A DAY   potassium chloride SA (KLOR-CON) 20 MEQ tablet Take 1 tablet (20 mEq total) by mouth daily.   sacubitril-valsartan (ENTRESTO) 24-26 MG Take 1 tablet by mouth 2 (two) times daily.   spironolactone (ALDACTONE) 25 MG tablet TAKE  1 TABLET BY MOUTH EVERY DAY   tamsulosin (FLOMAX) 0.4 MG CAPS capsule Take 0.4 mg by mouth daily.   torsemide (DEMADEX) 20 MG tablet TAKE 4 TABLETS (80 MG TOTAL) BY MOUTH 2 (TWO) TIMES DAILY.   traMADol (ULTRAM) 50 MG tablet TAKE 1 TABLET BY MOUTH DAILY AS NEEDED.   traZODone (DESYREL) 100 MG tablet TAKE 1 TABLET BY MOUTH EVERY DAY AT BEDTIME AS NEEDED   TUMS 500 MG chewable tablet Chew 500-2,000 mg by mouth every 4 (four) hours as needed for indigestion or heartburn.    venlafaxine XR (EFFEXOR-XR) 75 MG 24 hr capsule  TAKE 1 CAPSULE BY MOUTH DAILY WITH BREAKFAST.   zolpidem (AMBIEN) 10 MG tablet TAKE 1 TABLET BY MOUTH AT BEDTIME AS NEEDED   No facility-administered encounter medications on file as of 06/05/2021.   Patient Active Problem List   Diagnosis Date Noted   Pain and swelling of right lower leg 05/18/2021   Hemorrhagic shock ([place]) 05/05/2021   Gastrointestinal hemorrhage    Diverticulitis of colon    Preop exam for internal medicine 03/04/2021   B12 deficiency 03/04/2021   Vitamin D deficiency 03/01/2021   Myalgia 12/01/2020   Laceration of left leg 09/17/2020   Statin myopathy 08/02/2020   Rotator cuff arthropathy of both shoulders 02/23/2020   (HFpEF) heart failure with preserved ejection fraction ([place]) 02/22/2020   ICD (implantable cardioverter-defibrillator) in place 12/27/2019   COVID-19 virus infection 12/09/2019   COVID-19 12/09/2019   Hypotension 12/09/2019   Upper respiratory infection 11/27/2019   COPD exacerbation ([place]) 11/27/2019   Elevated PSA 09/23/2019   Lip lesion 12/23/2018   Diabetic ulcer of left lower leg with necrosis of muscle ([place]) 12/05/2018   Chronic bursitis of left shoulder 07/01/2018   Pain due to total knee replacement ([place]) 03/31/2018   Rotator cuff arthropathy, left 02/13/2018   Right rotator cuff tear arthropathy 01/14/2018   Increased prostate specific antigen (PSA) velocity 09/26/2017   Trigger point of thoracic region 07/04/2017   Right lumbar radiculopathy 05/15/2017   Right leg swelling 05/09/2017   Glaucoma 04/18/2017   History of nasal polyposis 02/19/2017   Chronic rhinitis 02/19/2017   Microhematuria 12/04/2016   Hyperlipidemia 08/15/2016   Encounter for well adult exam with abnormal findings 11/29/2015   Type II diabetes mellitus ([place])    Persistent atrial fibrillation ([place]) 03/30/2015   Syncope 03/30/2015   Acute on chronic diastolic heart failure ([place]) 03/30/2015   Former tobacco use 02/25/2015   Type 2 diabetes mellitus ([place])  02/25/2015   Atrial fibrillation ([place]) 02/25/2015   Anemia 02/23/2015   Thyrotoxicosis 02/23/2015   Hx of adenomatous colonic polyps 10/21/2014   De Quervain's tenosynovitis, right 04/30/2014   Osteoarthritis of right knee 02/19/2014   Encounter for therapeutic drug monitoring 11/17/2013   Thoracic degenerative disc disease 06/17/2013   Thoracic spondylosis without myelopathy 02/11/2013   Lumbosacral spondylosis without myelopathy 02/11/2013   Degenerative joint disease of knee, left 02/11/2013   COPD (chronic obstructive pulmonary disease) ([place]) 08/20/2012   Bundle branch block, right and extreme left anterior fascicular 05/05/2012   Nonischemic cardiomyopathy ([place]) 05/02/2012   Other primary cardiomyopathies 05/02/2012   Anticoagulated on warfarin 03/21/2012   Spongiotic dermatitis 09/26/2011   Past myocardial infarction 04/19/2011   UNSPECIFIED URETHRAL STRICTURE 12/26/2010   Chronic pain syndrome 05/18/2010   Obstructive sleep apnea 10/21/2007   NEPHROLITHIASIS, HX OF 10/21/2007   Anxiety 06/09/2007   GERD (gastroesophageal reflux disease) 06/09/2007   Benign prostatic hyperplasia 06/09/2007  Morbid obesity ([place]) 06/03/2007   Depression 06/03/2007   Essential hypertension 06/03/2007   INSOMNIA 06/03/2007   Conditions to be addressed/monitored:  Atrial Fibrillation and CHF  Care Plan : Heart Failure (Adult)  Updates made by Knox Royalty, RN since 06/05/2021 12:00 AM     Problem: Symptom Exacerbation (Heart Failure)   Priority: Medium     Long-Range Goal: Symptom Exacerbation Prevented or Minimized   Start Date: 04/27/2021  Expected End Date: 04/27/2022  This Visit's Progress: On track  Recent Progress: On track  Priority: Medium  Note:   Current Barriers:  Ongoing need for reinforcement and support in self-health management of heart failure in setting of multiple co-morbidities, including persistent Atrial Fibrillation Fragile state of health, multiple  progressing chronic health conditions Recent hospitalization July 22-27, 2022 for GI bleeding Nurse Case Manager Clinical Goal(s):  Over the next 12 months, patient will verbalize ongoing understanding of Heart Failure and Atrial Fibrillation Action Plan and when to call doctor, as evidenced by patient/ caregiver reporting during CCM RN CM outreach of: Completion of daily weight monitoring/ recording (notifying MD of 3 lb weight gain over night or 5 lb in a week) Adhering to low salt diet Taking prescribed medications as prescribed Interventions:  Collaboration with [name], MD regarding development and update of comprehensive plan of care as evidenced by provider attestation and co-signature Inter-disciplinary care team collaboration (see longitudinal plan of care) Chart reviewed including relevant office notes, upcoming scheduled appointments, and lab results  06/05/21: Received acute/ urgent call form patient, requesting call back -- Patient reports ongoing weakness post- recent hospitalization; states that he has developed an acute dental issue that causing him significant pain; reports does not want to see dentist, as he continues having low Hgb levels and does not think his tooth should be pulled; confirms is not established with dental provider.  Patient requesting that Dr. [name] send in Rx for triamcinolone dental paste 1%- states he has used this before and it helped.  Discussed with patient that Dr. [name] may wish to evaluate ion office: confirmed for patient that My-Chart message he sent over weekend will be processed by office team as soon as possible.  I will reach out to Dr. [name] as well as an FYI  From 05/22/21: Discussed current  clinical condition with patient- he reports being very weak post-hospital discharge; reports good appetite; denies new/ recent falls; denies changes in baseline chronic pain  Reviewed with patient recent hospitalization for GI bleeding Medication review:  reports he is at his girlfriend's home; unable to complete full review; we reviewed medication changes post- recent cardiology provider office visit: patient believes he should be taking Eliquis QD: verified by review of provider note that he should be taking BID: patient verbalizes understanding; states he will begin taking BID Confirmed patient has increased dose of Entresto; able to verbalize appropriate dosing, confirms taking BID Confirmed patient continues taking diuretics as prescribed Confirmed patient does not believe he has been in A-Fib recently Reviewed recent lab work/ CBC values; provided education around normal values, signs/ symptoms ongoing GI bleeding, along with action plan for same Reviewed recent provider office visits with patient: cardiology provider 05/17/21; PCP 05/18/21; wound clinic visits: patient reports wound on (R) leg not draining; states no follow up appointments necessary per wound clinic team Basic overview and discussion of pathophysiology of/ action plan for heart failure education reinforced: patient verbalizes good general understanding of baseline knowledge around same and could benefit from ongoing  reinforcement and support Confirmed patient continues following low salt, heart healthy diet-- reports eating more bland diet post- recent hospitalization Care Coordination telephone outreach placed to [facility] cardiology to assist patient in securing wheelchair for tomorrow's scheduled DVT US doppler studies: on extended hold; with patient's permission/ request, placed his phone number on que for follow up call- encouraged patient to use wheelchair to get to second floor for scheduled appointment, as he reports ongoing issues with weakness, leg swelling; daughter will be taking patient to scheduled appointment- provided patient with phone number to doppler study department Reviewed upcoming provider appointments with patient and confirmed that patient has plans to attend  all as scheduled: 05/23/21: DVT US doppler imaging at [facility]; 05/23/21- podiatry; 06/07/21: GI provider; 06/09/21: CHF clinic; 06/28/21- CHF clinic; daughter will be providing transportation for now due to patient's ongoing weakness post-hospital discharge Discussed plans for CCM RN CM follow up, scheduled next telephone visit with patient Self-Care Activities:  Takes Heart Failure Medications as prescribed Weighs daily and record (notifying MD of 3 lb weight gain over night or 5 lb in a week) Verbalizes understanding of and follows CHF Action Plan Adheres to low sodium diet  Patient Goals:  Continue to weigh yourself daily and write your weights at home down on paper or on the calendar- we will review these each time we have a telephone visit.  Today you reported a weight of 255.2 lbs Call office if I gain more than 2 pounds in one day or 5 pounds in one week, especially if this is associated with chest tightness, swelling in your legs/ ankles, and new or worsened shortness of breath Take your medications as prescribed: today, we verified that you should be taking Eliquis 5 mg twice a day Watch for swelling in feet, ankles and legs every day: contact your cardiologist if you believe your swelling is increasing Keep legs up while sitting Limit the salt in your diet: decrease your salt intake and use salt only in moderation Please read over the attached information- we will review this information periodically during our phone conversations Follow Up Plan:  Telephone follow up appointment with care management team member scheduled for:  Monday, July 03, 2021 at 1:00 pm The patient has been provided with contact information for the care management team and has been advised to call with any health related questions or concerns.      Plan: Telephone follow up appointment with care management team member scheduled for:  Monday July 03, 2021 at 1:00 pm The patient has been provided with  contact information for the care management team and has been advised to call with any health related questions or concerns  [name], RN, BSN, [place] [phone]: direct office [phone]: mobile

## 2021-06-06 ENCOUNTER — Telehealth: Payer: Self-pay

## 2021-06-06 NOTE — Telephone Encounter (Signed)
Sent pt mychart message, that he has an OV 06/08/21 with Dr. [name].

## 2021-06-06 NOTE — Telephone Encounter (Signed)
Appointment has been scheduled per Dr. [name].

## 2021-06-07 ENCOUNTER — Ambulatory Visit: Payer: Medicare HMO | Admitting: Physician Assistant

## 2021-06-07 ENCOUNTER — Encounter: Payer: Self-pay | Admitting: Physician Assistant

## 2021-06-07 VITALS — BP 120/78 | HR 72 | Ht 67.25 in | Wt 247.4 lb

## 2021-06-07 DIAGNOSIS — K5732 Diverticulitis of large intestine without perforation or abscess without bleeding: Secondary | ICD-10-CM | POA: Diagnosis not present

## 2021-06-07 DIAGNOSIS — K922 Gastrointestinal hemorrhage, unspecified: Secondary | ICD-10-CM

## 2021-06-07 DIAGNOSIS — Z7901 Long term (current) use of anticoagulants: Secondary | ICD-10-CM | POA: Diagnosis not present

## 2021-06-07 MED ORDER — PEG 3350-KCL-NA BICARB-NACL 420 G PO SOLR: 4000.0000 mL | Freq: Once | ORAL | 0 refills | Status: AC

## 2021-06-07 NOTE — Patient Instructions (Signed)
You will be contacted by our office prior to your procedure for directions on holding your Eliquis.  If you do not hear from our office 1 week prior to your scheduled procedure, please call [phone] to discuss.   You have been scheduled for a colonoscopy. Please follow written instructions given to you at your visit today.  Please pick up your prep supplies at the pharmacy within the next 1-3 days. If you use inhalers (even only as needed), please bring them with you on the day of your procedure.  If you are age 5 or older, your body mass index should be between 23-30. Your Body mass index is 38.46 kg/m. If this is out of the aforementioned range listed, please consider follow up with your Primary Care Provider.  If you are age 31 or younger, your body mass index should be between 19-25. Your Body mass index is 38.46 kg/m. If this is out of the aformentioned range listed, please consider follow up with your Primary Care Provider.   __________________________________________________________  The [facility] providers would like to encourage you to use [facility] to communicate with providers for non-urgent requests or questions.  Due to long hold times on the telephone, sending your provider a message by [facility] may be a faster and more efficient way to get a response.  Please allow 48 business hours for a response.  Please remember that this is for non-urgent requests.

## 2021-06-07 NOTE — Progress Notes (Signed)
EPIC Encounter for ICM Monitoring  Patient Name: [name] is a 65 y.o. male Date: 06/07/2021 Primary Care Physican: [name], MD Primary Cardiologist: [name] Electrophysiologist: Vergie Living Pacing: 99.9%    05/24/2022 Weight: 250.6 lbs (close to baseline) 06/07/2021 Weight: 245 lbs     Spoke with patient and heart failure questions reviewed.  Pt asymptomatic for fluid accumulation and feeling well.   Optivol thoracic impedance suggesting normal fluid levels.      Prescribed:  Torsemide 20 mg to 4 tablets (80 mg total) twice a day.   Potassium 20 mEq take 1 tablet daily Spironolactone 25 mg take 1 tablet daily.   Labs: 05/17/2021 Creatinine 1.11, BUN 14, Potassium 4.0, Sodium 135, GFR >60 05/10/2021 Creatinine 0.77, BUN 7,   Potassium 4.0, Sodium 137, GFR >60  05/09/2021 Creatinine 0.70, BUN 7,   Potassium 3.8, Sodium 139, GFR >60  05/08/2021 Creatinine 0.74, BUN 6,   Potassium 3.5, Sodium 139, GFR >60  05/07/2021 Creatinine 0.75, BUN 8,   Potassium 3.5, Sodium 138, GFR >60  05/06/2021 Creatinine 1.15, BUN 17, Potassium 4.0, Sodium 136, GFR >6  05/05/2021 Creatinine 1.45, BUN 21, Potassium 3.9, Sodium 136, GFR 53  A complete set of results can be found in Results Review.   Recommendations:   Encouraged to call if experiencing any fluid symptoms.    Follow-up plan: ICM clinic phone appointment on 06/26/2021   91 day device clinic remote transmission 07/26/2021   EP/Cardiology Office Visits:  06/09/2021 with Advanced [facility] clinic PA/NP.   Recall 02/22/2021 with Dr [name].    Copy of ICM check sent to Dr. [name].   3 month ICM trend: 06/05/2021.    1 Year ICM trend:       [name], RN 06/07/2021 4:58 PM

## 2021-06-07 NOTE — Progress Notes (Signed)
Reviewed and agree with plans for repeat colonoscopy. Given his prior incomplete colonoscopy I recommend that he return to [place] for his colonoscopy. Please cancel his [place] colonoscopy with me and arrange referral.   Norberto Sorenson T. Fuller Plan, MD [facility]

## 2021-06-07 NOTE — Progress Notes (Signed)
Chief Complaint: Follow-up hospitalization for GI bleed and diverticulitis  HPI:    Mr. Lucibello is a 65 year old Caucasian male with a past medical history of defibrillator placement, A. fib on Coumadin, cardiomyopathy, COPD, CHF (12/20/2020 echo with LVEF 40%) and multiple others, known to Dr. Fuller Plan, who was referred to me by [name], MD for follow-up after hospitalization for GI bleed and diverticulitis.    10/21/2014 colonoscopy with Dr. Fuller Plan with a sessile tubular adenomatous polyp in the transverse colon, adenomatous polyp in the transverse colon and moderate diverticulosis in the sigmoid and descending colon and grade 2 internal hemorrhoids.    03/04/2015 colonoscopy at [facility] done for history of colon polyps and occasional rectal bleeding with a recent incomplete colonoscopy due to colon redundancy with finding of diverticulosis and hemorrhoids.  Repeat recommended in 5 years due to history of polyps.    05/05/2021 patient seen in consult by our service for vomiting, bloody diarrhea and anemia.  CTA of the abdomen and pelvis showed no active extravasation, colonic diverticulosis with questionable mild or resolving proximal sigmoid diverticulitis.  At that time hemoglobin 5.6, patient received 2 units of PRBCs.  Patient started on IV antibiotics for diverticulitis.  His Coumadin was put on hold.  Patient seen last by her service on 05/08/2021 and his rectal bleeding was resolved.  Again his CTh showed acute diverticulitis.  At that point status post 6 units PRBCs in the setting of supratherapeutic INR (on Coumadin for A. fib).  Is recommended he finish antibiotics for total of 10 days which were switched to Augmentin.  Recommendations for an outpatient colonoscopy in 6 to 8 weeks after resolution of diverticulitis.    Today, the patient presents to clinic and tells me he is no longer having any of the symptoms that he had in the hospital.  He finished his antibiotics a couple of weeks ago  and is aware that he needs a colonoscopy.  Does describe that he had a colonoscopy here in which he was not fully asleep, but was also taking Vicodin at that time around 2016.  Also tells me that Dr. Fuller Plan had trouble getting around his colon last time and had to send him to [facility].  He also weighed 100 more pounds at that time which likely was contributing.    Denies fever, chills, weight loss, continued blood in his stool or abdominal pain.  Past Medical History:  Diagnosis Date   AICD (automatic cardioverter/defibrillator) present    Anemia    supposed to be taking Vit B but doesn't   ANXIETY    takes Xanax nightly   Arthritis    Asthma    Albuterol prn and Advair daily;also takes Prednisone daily   Atrial fibrillation ([place]) 09/06/2015   Cardiomyopathy ([place])    a. EF 25% TEE July 2013; b. EF normalized 2015;  c. 03/2015 Echo: EF 40-45%, difrf HK, PASP 38 mmHg, Mild MR, sev LAE/RAE.   CHF (congestive heart failure) (HCC)    Chronic constipation    takes OTC stool softener   COPD (chronic obstructive pulmonary disease) (HCC)    "one dr says COPD; one dr says emphysema" (09/18/2017)   DEPRESSION    takes Zoloft and Doxepin daily   Diverticulitis    DYSKINESIA, ESOPHAGUS    Emphysema of lung ([place])    "one dr says COPD; one dr says emphysema" (09/18/2017)   Essential hypertension        FIBROMYALGIA    GERD (  gastroesophageal reflux disease)        Glaucoma    HYPERLIPIDEMIA    a. Intolerant to statins.   INSOMNIA    takes Ambien nightly   Myocardial infarction [facility])    a. 2012 Myoview notable for prior infarct;  b. 03/2015 Lexiscan CL: EF 37%, diff HK, small area of inferior infarct from apex to base-->Med Rx.   Myocardial infarction ([place])    O2 dependent    "2.5L q hs & prn" (09/18/2017)   Paroxysmal atrial fibrillation ([place])    a. CHA2DS2VASc = 3--> takes Coumadin;  b. 03/15/2015 Successful TEE/DCCV;  c. 03/2015 recurrent afib, Amio d/c'd in setting of hyperthyroidism.    Peripheral neuropathy    Pneumonia 12/2016   Rash and other nonspecific skin eruption 04/12/2009   no cause found saw dermatologists x 2 and allergist   SLEEP APNEA, OBSTRUCTIVE    a. doesn't use CPAP   Syncope    a. 03/2015 s/p MDT LINQ.   Type II diabetes mellitus ([place])         Past Surgical History:  Procedure Laterality Date   ACNE CYST REMOVAL     2 on back    AV NODE ABLATION N/A 10/25/2017   Procedure: AV NODE ABLATION;  Surgeon: Deboraha Sprang, MD;  Location: [place] [facility];  Service: Cardiovascular;  Laterality: N/A;   BIV ICD INSERTION CRT-D N/A 09/18/2017   Procedure: BIV ICD INSERTION CRT-D;  Surgeon: Deboraha Sprang, MD;  Location: [place] [facility];  Service: Cardiovascular;  Laterality: N/A;   CARDIAC CATHETERIZATION N/A 03/21/2016   Procedure: Right/Left Heart Cath and Coronary Angiography;  Surgeon: Larey Dresser, MD;  Location: [place] [facility];  Service: Cardiovascular;  Laterality: N/A;   CARDIOVERSION  04/18/2012   Procedure: CARDIOVERSION;  Surgeon: Fay Records, MD;  Location: [place];  Service: Cardiovascular;  Laterality: N/A;   CARDIOVERSION  04/25/2012   Procedure: CARDIOVERSION;  Surgeon: Thayer Headings, MD;  Location: [place];  Service: Cardiovascular;  Laterality: N/A;   CARDIOVERSION  04/25/2012   Procedure: CARDIOVERSION;  Surgeon: Fay Records, MD;  Location: [place];  Service: Cardiovascular;  Laterality: N/A;   CARDIOVERSION  05/09/2012   Procedure: CARDIOVERSION;  Surgeon: Sherren Mocha, MD;  Location: [place];  Service: Cardiovascular;  Laterality: N/A;  changed from crenshaw to cooper by trish/leone-endo   CARDIOVERSION N/A 03/15/2015   Procedure: CARDIOVERSION;  Surgeon: Thayer Headings, MD;  Location: [place];  Service: Cardiovascular;  Laterality: N/A;   COLONOSCOPY     COLONOSCOPY WITH PROPOFOL N/A 10/21/2014   Procedure: COLONOSCOPY WITH PROPOFOL;  Surgeon: Ladene Artist, MD;  Location: [facility];  Service: Endoscopy;  Laterality:  N/A;   EP IMPLANTABLE DEVICE N/A 04/06/2015   Procedure: Loop Recorder Insertion;  Surgeon: Evans Lance, MD;  Location: [place] [facility];  Service: Cardiovascular;  Laterality: N/A;   ESOPHAGOGASTRODUODENOSCOPY     JOINT REPLACEMENT     LOOP RECORDER REMOVAL N/A 09/18/2017   Procedure: LOOP RECORDER REMOVAL;  Surgeon: Deboraha Sprang, MD;  Location: [place] [facility];  Service: Cardiovascular;  Laterality: N/A;   RIGHT/LEFT HEART CATH AND CORONARY ANGIOGRAPHY N/A 01/28/2017   Procedure: Right/Left Heart Cath and Coronary Angiography;  Surgeon: Larey Dresser, MD;  Location: [place] [facility];  Service: Cardiovascular;  Laterality: N/A;   TEE WITHOUT CARDIOVERSION  04/25/2012   Procedure: TRANSESOPHAGEAL ECHOCARDIOGRAM (TEE);  Surgeon: Thayer Headings, MD;  Location: [place];  Service: Cardiovascular;  Laterality: N/A;   TEE WITHOUT CARDIOVERSION N/A 03/15/2015   Procedure: TRANSESOPHAGEAL ECHOCARDIOGRAM (TEE);  Surgeon: Thayer Headings, MD;  Location: [place];  Service: Cardiovascular;  Laterality: N/A;   TONSILLECTOMY AND ADENOIDECTOMY     TOTAL KNEE ARTHROPLASTY Right 06/15/2014   Procedure: TOTAL KNEE ARTHROPLASTY;  Surgeon: Renette Butters, MD;  Location: [place];  Service: Orthopedics;  Laterality: Right;    Current Outpatient Medications  Medication Sig Dispense Refill   acetaminophen (TYLENOL) 500 MG tablet Take 500 mg by mouth every 6 (six) hours as needed.     albuterol (VENTOLIN HFA) 108 (90 Base) MCG/ACT inhaler INHALE 2 PUFFS 4 TIMES A DAY as needed 54 g 3   ALPRAZolam (XANAX) 0.5 MG tablet TAKE 2 TABLETS BY MOUTH AT BEDTIME 60 tablet 2   AMBULATORY NON FORMULARY MEDICATION Rolling walker with a bench 1 Units 0   apixaban (ELIQUIS) 5 MG TABS tablet Take 1 tablet (5 mg total) by mouth 2 (two) times daily. 60 tablet 0   augmented betamethasone dipropionate (DIPROLENE-AF) 0.05 % cream Apply 1 application topically daily.     carisoprodol (SOMA) 350 MG tablet TAKE 1 TABLET  BY MOUTH THREE TIMES A DAY AS NEEDED FOR MUSCLE SPASMS 90 tablet 5   Cholecalciferol 100 MCG (4000 UT) CAPS 1 tab by mouth once daily 30 capsule 99   dapagliflozin propanediol (FARXIGA) 10 MG TABS tablet Take 1 tablet (10 mg total) by mouth daily before breakfast. 30 tablet 11   digoxin (LANOXIN) 0.25 MG tablet TAKE 1/2 TABLET BY MOUTH EVERY DAY 45 tablet 3   doxepin (SINEQUAN) 10 MG capsule TAKE 2 CAPSULES (20 MG TOTAL) BY MOUTH AT BEDTIME AS NEEDED. 180 capsule 1   Dupilumab (DUPIXENT) 300 MG/2ML SOPN Inject 1 Dose into the skin every 14 (fourteen) days.     Evolocumab (REPATHA SURECLICK) [id number] MG/ML SOAJ Inject 1 pen into the skin every 14 (fourteen) days. 6 mL 3   fluticasone (FLONASE) 50 MCG/ACT nasal spray PLACE 2 SPRAYS INTO BOTH NOSTRILS DAILY AS NEEDED FOR ALLERGIES OR RHINITIS. 48 mL 1   hydrOXYzine (ATARAX/VISTARIL) 10 MG tablet TAKE 1 TABLET BY MOUTH 3 TIMES DAILY AS NEEDED FOR ITCHING. 60 tablet 2   ketoconazole (NIZORAL) 2 % cream Apply 1 application topically daily. 30 g 1   lidocaine (LIDODERM) 5 % APPLY 1 PATCH TO AFFECTED AREA FOR UP TO 12 HOURS . DO NOT USE MORE THAN ONE PATCH IN 24 HOURS. 90 patch 2   metolazone (ZAROXOLYN) 2.5 MG tablet Take 1 tablet (2.5 mg total) by mouth as directed. With additional 40 meq of potassium 5 tablet 0   omeprazole (PRILOSEC) 20 MG capsule TAKE 1 CAPSULE BY MOUTH TWICE A DAY 180 capsule 1   potassium chloride SA (KLOR-CON) 20 MEQ tablet Take 1 tablet (20 mEq total) by mouth daily. 90 tablet 3   sacubitril-valsartan (ENTRESTO) 24-26 MG Take 1 tablet by mouth 2 (two) times daily. 60 tablet 11   spironolactone (ALDACTONE) 25 MG tablet TAKE 1 TABLET BY MOUTH EVERY DAY 90 tablet 3   tamsulosin (FLOMAX) 0.4 MG CAPS capsule Take 0.4 mg by mouth daily.     torsemide (DEMADEX) 20 MG tablet TAKE 4 TABLETS (80 MG TOTAL) BY MOUTH 2 (TWO) TIMES DAILY. 720 tablet 1   traMADol (ULTRAM) 50 MG tablet TAKE 1 TABLET BY MOUTH DAILY AS NEEDED. 30 tablet 5   traZODone  (DESYREL) 100 MG tablet TAKE 1 TABLET BY MOUTH EVERY DAY  AT BEDTIME AS NEEDED 90 tablet 1   TUMS 500 MG chewable tablet Chew 500-2,000 mg by mouth every 4 (four) hours as needed for indigestion or heartburn.      venlafaxine XR (EFFEXOR-XR) 75 MG 24 hr capsule TAKE 1 CAPSULE BY MOUTH DAILY WITH BREAKFAST. 90 capsule 1   zolpidem (AMBIEN) 10 MG tablet TAKE 1 TABLET BY MOUTH AT BEDTIME AS NEEDED 90 tablet 1   No current facility-administered medications for this visit.    Allergies as of 06/07/2021 - Review Complete 06/05/2021  Allergen Reaction Noted   Amiodarone Other (See Comments) 09/01/2015   Statins Other (See Comments) 02/18/2012   Tape Other (See Comments) 03/09/2015    Family History  Problem Relation Age of Onset   COPD Mother    Asthma Mother    Colon polyps Mother    Allergies Mother    Hypothyroidism Mother    Asthma Maternal Grandmother    Colon cancer Neg Hx     Social History   Socioeconomic History   Marital status: Divorced    Spouse name: Not on file   Number of children: 2   Years of education: Not on file   Highest education level: Not on file  Occupational History   Occupation: retired/disabled. prev worked in Therapist, sports.    Employer: DISABLED  Tobacco Use   Smoking status: Former    Packs/day: 2.00    Years: 30.00    Pack years: 60.00    Types: Cigarettes    Quit date: 10/16/2007    Years since quitting: 13.6   Smokeless tobacco: Never  Vaping Use   Vaping Use: Never used  Substance and Sexual Activity   Alcohol use: No   Drug use: No   Sexual activity: Not Currently  Other Topics Concern   Not on file  Social History Narrative   Lives alone.   Social Determinants of Health   Financial Resource Strain: Low Risk    Difficulty of Paying Living Expenses: Not hard at all  Food Insecurity: No Food Insecurity   Worried About Charity fundraiser in the Last Year: Never true   [place] in the Last Year: Never true   Transportation Needs: No Transportation Needs   Lack of Transportation (Medical): No   Lack of Transportation (Non-Medical): No  Physical Activity: Inactive   Days of Exercise per Week: 0 days   Minutes of Exercise per Session: 0 min  Stress: No Stress Concern Present   Feeling of Stress : Not at all  Social Connections: Moderately Integrated   Frequency of Communication with Friends and Family: More than three times a week   Frequency of Social Gatherings with Friends and Family: More than three times a week   Attends Religious Services: More than 4 times per year   Active Member of [employer] or Organizations: Yes   Attends Music therapist: More than 4 times per year   Marital Status: Never married  Human resources officer Violence: Not At Risk   Fear of Current or Ex-Partner: No   Emotionally Abused: No   Physically Abused: No   Sexually Abused: No    Review of Systems:    Constitutional: No weight loss, fever or chills Cardiovascular: No chest pain Respiratory: No SOB Gastrointestinal: See HPI and otherwise negative   Physical Exam:  Vital signs: BP 120/78 (BP Location: Left Arm, Patient Position: Sitting, Cuff Size: Normal)   Pulse 72   Ht 5' 7.25" (1.708  m) Comment: height measured without shoes  Wt 247 lb 6 oz (112.2 kg)   BMI 38.46 kg/m   Constitutional:   Pleasant Overweight Caucasian male appears to be in NAD, Well developed, Well nourished, alert and cooperative Respiratory: Respirations even and unlabored. Lungs clear to auscultation bilaterally.   No wheezes, crackles, or rhonchi.  Cardiovascular: Normal S1, S2. No MRG. Regular rate and rhythm. No peripheral edema, cyanosis or pallor.  Gastrointestinal:  Soft, nondistended, nontender. No rebound or guarding. Normal bowel sounds. No appreciable masses or hepatomegaly. Rectal:  Not performed.  Psychiatric: Demonstrates good judgement and reason without abnormal affect or behaviors.  RELEVANT LABS AND  IMAGING: CBC    Component Value Date/Time   WBC 7.9 05/17/2021 1546   RBC 3.30 (L) 05/17/2021 1546   HGB 9.6 (L) 05/17/2021 1546   HGB 12.8 (L) 12/19/2017 1532   HCT 30.6 (L) 05/17/2021 1546   HCT 38.0 12/19/2017 1532   PLT 288 05/17/2021 1546   PLT 208 12/19/2017 1532   MCV 92.7 05/17/2021 1546   MCV 99 (H) 12/19/2017 1532   MCH 29.1 05/17/2021 1546   MCHC 31.4 05/17/2021 1546   RDW 16.3 (H) 05/17/2021 1546   RDW 14.0 12/19/2017 1532   LYMPHSABS 1.5 05/05/2021 0801   LYMPHSABS 1.9 12/19/2017 1532   MONOABS 1.2 (H) 05/05/2021 0801   EOSABS 0.0 05/05/2021 0801   EOSABS 0.3 12/19/2017 1532   BASOSABS 0.0 05/05/2021 0801   BASOSABS 0.0 12/19/2017 1532    CMP     Component Value Date/Time   NA 135 05/24/2021 1426   NA 141 12/19/2017 1532   K 4.5 05/24/2021 1426   CL 108 05/24/2021 1426   CO2 23 05/24/2021 1426   GLUCOSE 93 05/24/2021 1426   BUN 14 05/24/2021 1426   BUN 20 12/19/2017 1532   CREATININE 0.95 05/24/2021 1426   CREATININE 0.92 03/19/2016 1420   CALCIUM 8.5 (L) 05/24/2021 1426   PROT 4.4 (L) 05/05/2021 0801   PROT 6.8 12/19/2017 1532   ALBUMIN 2.6 (L) 05/05/2021 0801   ALBUMIN 4.4 12/19/2017 1532   AST 17 05/05/2021 0801   ALT 15 05/05/2021 0801   ALKPHOS 37 (L) 05/05/2021 0801   BILITOT 0.6 05/05/2021 0801   BILITOT 0.4 12/19/2017 1532   GFRNONAA >60 05/24/2021 1426   GFRAA >60 07/14/2020 1339    Assessment: 1.  GI bleed: Recent hospitalization for GI bleed thought related to supratherapeutic INR and diverticulitis, no further bleeding now and hemoglobin stabilized 2.  Diverticulitis: Recent hospitalization for diverticulitis, patient is now finished with his antibiotics with no further symptoms, recommendations were for colonoscopy at discharge 3.  Chronic anticoagulation for A. fib: On Eliquis  Plan: 1.  Scheduled patient for a colonoscopy in the [place] with Dr. Fuller Plan.  Did provide the patient a detailed list of risks for the procedure and he agrees  to proceed.  This will be scheduled at least 6 weeks from now to allow him adequate time to heal from his diverticulitis. 2.  Will double check with Dr. Fuller Plan that he does not prefer patient to be referred out to tertiary center given difficulty at time of last colonoscopy, though patient has now lost 100 pounds and likely weight was contributory at the time of last procedure. 3.  Patient was advised to hold his Eliquis for 2 days.  We will communicate with his prescribing physician to ensure that holding his Eliquis is acceptable for him. 4.  Patient follow in clinic per recommendations  from Dr. Fuller Plan after time of procedure.  Ellouise Newer, PA-C [facility] 06/07/2021, 11:06 AM  Cc: [name], MD

## 2021-06-08 ENCOUNTER — Telehealth: Payer: Self-pay | Admitting: *Deleted

## 2021-06-08 ENCOUNTER — Other Ambulatory Visit: Payer: Self-pay

## 2021-06-08 ENCOUNTER — Ambulatory Visit (INDEPENDENT_AMBULATORY_CARE_PROVIDER_SITE_OTHER): Payer: Medicare HMO | Admitting: Internal Medicine

## 2021-06-08 ENCOUNTER — Encounter: Payer: Self-pay | Admitting: Internal Medicine

## 2021-06-08 DIAGNOSIS — K089 Disorder of teeth and supporting structures, unspecified: Secondary | ICD-10-CM

## 2021-06-08 DIAGNOSIS — I10 Essential (primary) hypertension: Secondary | ICD-10-CM | POA: Diagnosis not present

## 2021-06-08 DIAGNOSIS — S025XXA Fracture of tooth (traumatic), initial encounter for closed fracture: Secondary | ICD-10-CM | POA: Diagnosis not present

## 2021-06-08 MED ORDER — TRIAMCINOLONE ACETONIDE 0.1 % MT PSTE: PASTE | Freq: Two times a day (BID) | OROMUCOSAL | 2 refills | Status: DC

## 2021-06-08 NOTE — Telephone Encounter (Signed)
Faxed referral to Dr. Arsenio Loader at [facility]. Appointment cancel for colonoscopy. Patient informed.

## 2021-06-08 NOTE — Patient Instructions (Signed)
Please continue all other medications as before, including the dental paste  Please have the pharmacy call with any other refills you may need.  Please continue your efforts at being more active, low cholesterol diet, and weight control.  Please keep your appointments with your specialists as you may have planned

## 2021-06-08 NOTE — Progress Notes (Signed)
ID:  [name], DOB 13-Nov-1955, MRN [id number]  Provider location: [facility] Type of Visit: Established patient  PCP:  [name], MD  Cardiologist:  Dr. [name]   History of Present Illness: [name] is a 65 y.o. male who has history of COPD on home oxygen, permanent atrial fibrillation, and chronic systolic CHF.  Amiodarone and DCCV were tried for atrial fibrillation in the past without success.   Patient has been noted to have a low EF since 2013.  EF improved by to normal by 2015 but dropped to 30-35% by 11/16.  Cath in 6/17 showed some CAD but not enough to cause cardiomyopathy.  He was admitted in 3/18 with PNA, atrial fibrillation/RVR, and acute on chronic systolic CHF.  Echo in 3/18 showed fall in EF to 10-15%.   I took him for right and left heart cath in 4/18, which showed nonobstructive CAD and relatively optimized filling pressures.    Syncope in 11/18. Loop recorder showed 6 second pause and periods of complete heart block. He had Medtronic BiV ICD placed in 12/18.  In 1/19, he had AV nodal ablation to promote BiV pacing.   Echo was done 6/19 with EF 25-30% with moderate RV dilation/mildly decreased systolic function. CPX 7/19 was submaximal, probably mild functional impairment.    Echo in 1/21 showed EF 35-40% with diffuse hypokinesis, moderate RV dilation with mildly decreased systolic function.   He was admitted with COVID-19 PNA in 2/19.  He had AKI/hypotension and Entresto was stopped.   Echo in 3/22 showed EF 40% with diffuse hypokinesis, mild RV enlargement, mildly decreased RV systolic function.   Presented to [facility] 7/22 with weakness and dizziness. Found to be in hemorrhagic shock from acute GIB. Hgb 5.0, INR 8.4. He was on Coumadin for atrial fibrillation. He was resuscitated with PRBCs, IVF, and given Vitamin K. His Entresto, beta blocker and torsemide were held due to hypotension. He initially required ICU monitoring due to  hypotension requiring pressors.  GI team was consulted and planned to treat with oral antibiotics, for suspected diverticular bleed with plans for outpatient colonoscopy.  Coumadin was transitioned to Eliquis.  He was discharged back on beta blocker, Entresto and SGLT2i. Dig, spiro and torsemide were held at discharge. Weight 259 lbs.  Today he returns for HF follow up. He was volume overloaded at his post-hospital follow up and instructed to take metolazone for 1 dose. Breathing is about the same today. He uses canes to get around the house but no significant exertional dyspnea, however he avoids stairs. Denies CP, dizziness, edema, or PND/Orthopnea. Appetite ok. No fever or chills. Weight at home 245 pounds. BP at home is dropping to 60-70s after taking Entresto. Has only been taking 3 torsemide tablets in evening. Having sharp breast pain that switches sides intermittently. He says this has been on-going for a few weeks to months.  ICD interrogation (personally reviewed): Fluid index back down appears to be slowly trending up, thoracic impedence stable, >99% bi-v pacing, no VT/VF, ~1 hr/day activity.  Labs (4/18): pro-BNP 904, K 3.1, creatinine 1.39 => 1.08 with BUN 66 => 37, hgb 14.1.  Labs (5/18): hemoglobin 13.1 Labs (6/18): K 4, creatinine 1.3, BNP 294 Labs (7/18): K 3.8, creatinine 1.23, LDL 96, HDL 42 Labs (8/18): K 4.5, creatinine 1.22 Labs (10/18): digoxin 0.4 Labs (12/18): LDL 115, HDL 37, TGs 214, K 4.2, creatinine 0.84 Labs (1/19): K 4.4, creatinine 0.75, pro-BNP 433 Labs (2/19): digoxin 0.4  Labs (3/19): LDL 121, hgb 12.8, K 4.5, creatinine 0.98 Labs (6/19): LDL 48, HDL 45, K 5, creatinine 0.94, digoxin 0.3 Labs (9/19): K 3.7, creatinine 1.09 => 1.32, digoxin 0.2 Labs (11/19): LDL 115 Labs (12/20): K 3.9, creatinine 0.87, LDL 94  Labs (3/21): K 4, creatinine 1.08 Labs (6/21): LDL 109 Labs (7/21): K 3.7, creatinine 1.15 Labs (12/21): LDL 129, TGs 239, hgb 12.9, K 3.9, creatinine  0.98, digoxin 0.8 Labs (1/22): ALT 76, AST 40, LDL 162, K 3.8 =>4.6, creatinine 1.16 => 1.12, digoxin 0.7, repeat LFTs normal Labs (5/22): LDL 72, TGs 335, K 4.2, creatinine 1.29 Labs (7/22): K 4.0, creatinine 0.77 Labs (8/22): K 4.0, creatinine 1.11  PMH: 1. Asthma 2. COPD: On home oxygen. Prior smoker.  3. Atrial fibrillation: Permanent.  He failed DCCV and amiodarone in the past . 4. HTN 5. GERD 6. Hyperlipidemia: Myalgias with atorvastatin 7. BPH.  8. Chronic systolic CHF: Nonischemic cardiomyopathy.  - EF 25% by TEE in 7/13.  Normalized on 2015 echo. - Echo (11/16): EF 30-35%.  - LHC/RHC (6/17): 80% ostial stenosis small D1, 30-40% pLAD.  Mean RA 4, PA 26/10, mean PCWP 11, CI 2.33.  - Echo (3/18): EF 10-15%, diffuse hypokinesis, severe LV dilation, moderate RV dilation with severely decreased systolic function.  - LHC/RHC (4/18): 40% proximal LAD.  Mean RA 8, PA 37/10 mean 22, mean PCWP 22, LVEDP 9, CI 2.39. - Medtronic BiV ICD placement in 12/18 with AV nodal ablation to promote BiV pacing in 1/19.  - Echo (6/19): EF 25-30% with mild LV dilation, moderately dilated RV with mildly decreased systolic function, IVC not dilated, PASP 23 mmHg.  - CPX (7/19): peak VO2 17.6 (78% predicted), VE/VCO2 slope 31, RER 1.02 => submaximal, probably mild functional impairment.  - Echo (1/21): EF 35-40% with diffuse hypokinesis, moderate RV dilation with mildly decreased systolic function. - Echo (3/22): EF 40% with diffuse hypokinesis, mild RV enlargement, mildly decreased RV systolic function.  9. Morbid obesity 10. ILR in place.  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 Complete heart block: s/p Medtronic CRT-D.  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 OSA: Untreated, unable to tolerate CPAP.  15. COVID-19 PNA 2/21.  16. Hyperlipidemia: Myalgias with multiple statins.  17. PAD: ABIs 10/21 noncompressible on right but TBI normal, normal ABI and TBI on left.   Social History   Socioeconomic History    Marital status: Divorced    Spouse name: Not on file   Number of children: 2   Years of education: Not on file   Highest education level: Not on file  Occupational History   Occupation: retired/disabled. prev worked in Therapist, sports.    Employer: DISABLED  Tobacco Use   Smoking status: Former    Packs/day: 2.00    Years: 30.00    Pack years: 60.00    Types: Cigarettes    Quit date: 10/16/2007    Years since quitting: 13.6   Smokeless tobacco: Never  Vaping Use   Vaping Use: Never used  Substance and Sexual Activity   Alcohol use: No   Drug use: No   Sexual activity: Not Currently  Other Topics Concern   Not on file  Social History Narrative   Lives alone.   Social Determinants of Health   Financial Resource Strain: Low Risk    Difficulty of Paying Living Expenses: Not hard at all  Food Insecurity: No Food Insecurity   Worried About Charity fundraiser in the Last Year: Never true   Ran  Out of Food in the Last Year: Never true  Transportation Needs: No Transportation Needs   Lack of Transportation (Medical): No   Lack of Transportation (Non-Medical): No  Physical Activity: Inactive   Days of Exercise per Week: 0 days   Minutes of Exercise per Session: 0 min  Stress: No Stress Concern Present   Feeling of Stress : Not at all  Social Connections: Moderately Integrated   Frequency of Communication with Friends and Family: More than three times a week   Frequency of Social Gatherings with Friends and Family: More than three times a week   Attends Religious Services: More than 4 times per year   Active Member of [employer] or Organizations: Yes   Attends Music therapist: More than 4 times per year   Marital Status: Never married  Human resources officer Violence: Not At Risk   Fear of Current or Ex-Partner: No   Emotionally Abused: No   Physically Abused: No   Sexually Abused: No   Family History  Problem Relation Age of Onset   COPD Mother    Asthma Mother     Colon polyps Mother    Allergies Mother    Hypothyroidism Mother    Asthma Maternal Grandmother    Colon cancer Neg Hx    ROS: All systems reviewed and negative except as per HPI.   Current Outpatient Medications  Medication Sig Dispense Refill   acetaminophen (TYLENOL) 500 MG tablet Take 500 mg by mouth every 6 (six) hours as needed.     albuterol (VENTOLIN HFA) 108 (90 Base) MCG/ACT inhaler INHALE 2 PUFFS 4 TIMES A DAY as needed 54 g 3   ALPRAZolam (XANAX) 0.5 MG tablet TAKE 2 TABLETS BY MOUTH AT BEDTIME 60 tablet 2   AMBULATORY NON FORMULARY MEDICATION Rolling walker with a bench 1 Units 0   apixaban (ELIQUIS) 5 MG TABS tablet Take 1 tablet (5 mg total) by mouth 2 (two) times daily. 60 tablet 0   augmented betamethasone dipropionate (DIPROLENE-AF) 0.05 % cream Apply 1 application topically daily.     carisoprodol (SOMA) 350 MG tablet TAKE 1 TABLET BY MOUTH THREE TIMES A DAY AS NEEDED FOR MUSCLE SPASMS 90 tablet 5   Cholecalciferol 100 MCG (4000 UT) CAPS 1 tab by mouth once daily 30 capsule 99   dapagliflozin propanediol (FARXIGA) 10 MG TABS tablet Take 1 tablet (10 mg total) by mouth daily before breakfast. 30 tablet 11   digoxin (LANOXIN) 0.25 MG tablet TAKE 1/2 TABLET BY MOUTH EVERY DAY 45 tablet 3   doxepin (SINEQUAN) 10 MG capsule TAKE 2 CAPSULES (20 MG TOTAL) BY MOUTH AT BEDTIME AS NEEDED. 180 capsule 1   Dupilumab (DUPIXENT) 300 MG/2ML SOPN Inject 1 Dose into the skin every 14 (fourteen) days.     eplerenone (INSPRA) 25 MG tablet Take 1 tablet (25 mg total) by mouth daily. 90 tablet 3   Evolocumab (REPATHA SURECLICK) [id number] MG/ML SOAJ Inject 1 pen into the skin every 14 (fourteen) days. 6 mL 3   fluticasone (FLONASE) 50 MCG/ACT nasal spray PLACE 2 SPRAYS INTO BOTH NOSTRILS DAILY AS NEEDED FOR ALLERGIES OR RHINITIS. 48 mL 1   hydrOXYzine (ATARAX/VISTARIL) 10 MG tablet TAKE 1 TABLET BY MOUTH 3 TIMES DAILY AS NEEDED FOR ITCHING. 60 tablet 2   ketoconazole (NIZORAL) 2 % cream Apply 1  application topically daily. 30 g 1   lidocaine (LIDODERM) 5 % APPLY 1 PATCH TO AFFECTED AREA FOR UP TO 12 HOURS . DO NOT USE  MORE THAN ONE PATCH IN 24 HOURS. 90 patch 2   losartan (COZAAR) 25 MG tablet Take 0.5 tablets (12.5 mg total) by mouth daily. 45 tablet 3   metolazone (ZAROXOLYN) 2.5 MG tablet Take 1 tablet (2.5 mg total) by mouth as directed. With additional 40 meq of potassium 5 tablet 0   omeprazole (PRILOSEC) 20 MG capsule TAKE 1 CAPSULE BY MOUTH TWICE A DAY 180 capsule 1   potassium chloride SA (KLOR-CON) 20 MEQ tablet Take 1 tablet (20 mEq total) by mouth daily. 90 tablet 3   tamsulosin (FLOMAX) 0.4 MG CAPS capsule Take 0.4 mg by mouth daily.     torsemide (DEMADEX) 20 MG tablet Take 20 mg by mouth. Patient is taking 4 tablets in the morning and 3 tablets in the evening.     traMADol (ULTRAM) 50 MG tablet TAKE 1 TABLET BY MOUTH DAILY AS NEEDED. 30 tablet 5   traZODone (DESYREL) 100 MG tablet TAKE 1 TABLET BY MOUTH EVERY DAY AT BEDTIME AS NEEDED 90 tablet 1   triamcinolone (KENALOG) 0.1 % paste Use as directed in the mouth or throat 2 (two) times daily. 5 g 2   TUMS 500 MG chewable tablet Chew 500-2,000 mg by mouth every 4 (four) hours as needed for indigestion or heartburn.      venlafaxine XR (EFFEXOR-XR) 75 MG 24 hr capsule TAKE 1 CAPSULE BY MOUTH DAILY WITH BREAKFAST. 90 capsule 1   zolpidem (AMBIEN) 10 MG tablet TAKE 1 TABLET BY MOUTH AT BEDTIME AS NEEDED 90 tablet 1   No current facility-administered medications for this encounter.   Wt Readings from Last 3 Encounters:  06/09/21 111.4 kg (245 lb 9.6 oz)  06/08/21 112 kg (247 lb)  06/07/21 112.2 kg (247 lb 6 oz)   BP 96/60   Pulse 77   Wt 111.4 kg (245 lb 9.6 oz)   SpO2 96%   BMI 38.18 kg/m   General:  NAD. No resp difficulty, walked in clinic with walker. HEENT: Normal Neck: Supple. No JVD. Carotids 2+ bilat; no bruits. No lymphadenopathy or thryomegaly appreciated. Cor: PMI nondisplaced. Irregular rate & rhythm. No  rubs, gallops or murmurs. Lungs: Clear; dense bilateral breast tissue, tender to palpation. Abdomen: Obese, nontender, nondistended. No hepatosplenomegaly. No bruits or masses. Good bowel sounds. Extremities: No cyanosis, clubbing, rash, trace pedal edema R>L, left knee brace in place Neuro: Alert & oriented x 3, cranial nerves grossly intact. Moves all 4 extremities w/o difficulty. Affect pleasant.  Assessment/Plan:  1. Atrial fibrillation: Permanent.  Now s/p AV nodal ablation to allow effective BiV pacing.  - Off warfarin with recent GI bleed. - Continue Eliquis. No further bleeding issues. 2. COPD: No longer smoking.  Not using oxygen.  3. Chronic systolic CHF: Nonischemic cardiomyopathy based on 6/17 cath. However, he had a recent significant fall in EF from 30-35% to 10-15% on echo from 3/18.  LHC/RHC in 4/18 showed nonobstructive CAD and relatively optimized filling pressures.  He now has Medtronic CRT-D device with AV nodal ablation to allow BiV pacing.  Echo 6/19 with EF 25-30% with mildly decreased RV systolic function. CPX 7/19 was submaximal but likely only mild functional limitation from CHF.  Echo in 1/21 showed EF 35-40%, echo in 3/22 showed EF up to 40% with mildly decreased RV systolic function.  NYHA class II-III symptoms, functional class confounded by body habitus, knee OA and physical deconditioning. He is not overloaded on exam or OptiVol.  - Continue torsemide 80 mg bid. (Reiterated to  take 80 mg in evening as he has been taking 60 mg.) - Stop Entresto (BP in 60s at home after taking medication). - Start losartan 12.5 mg qhs. - Hold off on re-starting carvedilol with borderline BP today.  - Continue Farxiga 10 mg daily. - Continue digoxin 0.0625 mg daily. - Continue spironolactone 25 mg daily.  BMET today. 4. CAD: Nonobstructive on 4/18 cath.  No exertional chest pain.  - No asa with Eliquis. - Statin myalgias, continue Repatha. Good LDL in 5/22.  - Elevated  triglycerides, will give trial of dietary changes but Vascepa would be next step.  5. Complete heart block: Now has Medtronic CRT-D device and s/p AV nodal ablation.  7. OSA: Unable to tolerate CPAP.   8. Elevated LFTs: Most recent LFTs back to normal.   9. Left knee OA: Needs TKR.  He is stable from a cardiac standpoint.  I think that he could have TKR with moderate but not unacceptable risk of complications.  10. Breast tenderness/swelling: Breast tissue dense and tender to palpation on exam. Will arrange for ultrasound. Discussed with Dr. [name]. - Stop spiro and start eplerenone 25 mg daily.  Followup in 4 weeks with PharmD, anticipate adding beta blocker and will need digoxin level. Follow up with Dr. [name] in 2-3 months.  Signed, Rafael Bihari, FNP  06/09/2021  [facility] [address] [facility] [place] [address] [phone] (office) [phone] (fax)

## 2021-06-08 NOTE — Telephone Encounter (Signed)
-----   Message from Levin Erp, [place] sent at 06/07/2021 12:08 PM EDT ----- Regarding: FW: can you review Can you fix for me. thanks ----- Message ----- From: Ladene Artist, MD Sent: 06/07/2021  11:55 AM EDT To: Levin Erp, PA Subject: RE: can you review                             Given his prior incomplete colonoscopy I recommend that he return to [facility] for his colonoscopy. Please cancel [place] colonoscopy with me and arrange referral. Thanks.   ----- Message ----- From: Levin Erp, PA Sent: 06/07/2021  11:32 AM EDT To: Ladene Artist, MD Subject: can you review                                 Can you please review chart/office visit note from today.  Patient is due for repeat colonoscopy.  We did have to send him out to a tertiary center as his last one was incomplete here.  He did weigh 100 more pounds than he does now, so likely this was contributing.  We have gone ahead and scheduled him for a colonoscopy in 6 weeks with you but we can cancel this and send him back to [facility] if you wish.  Thanks, JL L

## 2021-06-08 NOTE — Addendum Note (Signed)
Addended by: Horris Latino on: 06/08/2021 01:32 PM   Modules accepted: Orders

## 2021-06-08 NOTE — Progress Notes (Signed)
Patient ID: [name], male   DOB: [birth year], 65 y.o.   MRN: [id number]        Chief Complaint: follow up left upper post molar cracked tooth       HPI:  [name] is a 65 y.o. male here with a known cracked tooth for several months, has used a steroid dental paste to help with swelling and discomfort, asks for refill; denies fever, worsening pain, other swelling or drainage; Pt denies chest pain, increased sob or doe, wheezing, orthopnea, PND, increased LE swelling, palpitations, dizziness or syncope.   Pt denies polydipsia, polyuria, or new focal neuro s/s.   Pt denies fever, wt loss, night sweats, loss of appetite, or other constitutional symptoms        Wt Readings from Last 3 Encounters:  06/09/21 245 lb 9.6 oz (111.4 kg)  06/08/21 247 lb (112 kg)  06/07/21 247 lb 6 oz (112.2 kg)   BP Readings from Last 3 Encounters:  06/09/21 96/60  06/08/21 98/62  06/07/21 120/78         Past Medical History:  Diagnosis Date   AICD (automatic cardioverter/defibrillator) present    Anemia    supposed to be taking Vit B but doesn't   ANXIETY    takes Xanax nightly   Arthritis    Asthma    Albuterol prn and Advair daily;also takes Prednisone daily   Atrial fibrillation ([place]) 09/06/2015   Cardiomyopathy ([place])    a. EF 25% TEE July 2013; b. EF normalized 2015;  c. 03/2015 Echo: EF 40-45%, difrf HK, PASP 38 mmHg, Mild MR, sev LAE/RAE.   CHF (congestive heart failure) (HCC)    Chronic constipation    takes OTC stool softener   COPD (chronic obstructive pulmonary disease) (HCC)    "one dr says COPD; one dr says emphysema" (09/18/2017)   DEPRESSION    takes Zoloft and Doxepin daily   Diverticulitis    DYSKINESIA, ESOPHAGUS    Emphysema of lung ([place])    "one dr says COPD; one dr says emphysema" (09/18/2017)   Essential hypertension        FIBROMYALGIA    GERD (gastroesophageal reflux disease)        Glaucoma    HYPERLIPIDEMIA    a. Intolerant to statins.   INSOMNIA    takes  Ambien nightly   Myocardial infarction [facility])    a. 2012 Myoview notable for prior infarct;  b. 03/2015 Lexiscan CL: EF 37%, diff HK, small area of inferior infarct from apex to base-->Med Rx.   Myocardial infarction ([place])    O2 dependent    "2.5L q hs & prn" (09/18/2017)   Paroxysmal atrial fibrillation ([place])    a. CHA2DS2VASc = 3--> takes Coumadin;  b. 03/15/2015 Successful TEE/DCCV;  c. 03/2015 recurrent afib, Amio d/c'd in setting of hyperthyroidism.   Peripheral neuropathy    Pneumonia 12/2016   Rash and other nonspecific skin eruption 04/12/2009   no cause found saw dermatologists x 2 and allergist   SLEEP APNEA, OBSTRUCTIVE    a. doesn't use CPAP   Syncope    a. 03/2015 s/p MDT LINQ.   Type II diabetes mellitus ([place])        Past Surgical History:  Procedure Laterality Date   ACNE CYST REMOVAL     2 on back    AV NODE ABLATION N/A 10/25/2017   Procedure: AV NODE ABLATION;  Surgeon: Deboraha Sprang, MD;  Location: [place] [facility];  Service:  Cardiovascular;  Laterality: N/A;   BIV ICD INSERTION CRT-D N/A 09/18/2017   Procedure: BIV ICD INSERTION CRT-D;  Surgeon: Deboraha Sprang, MD;  Location: [place] [facility];  Service: Cardiovascular;  Laterality: N/A;   CARDIAC CATHETERIZATION N/A 03/21/2016   Procedure: Right/Left Heart Cath and Coronary Angiography;  Surgeon: Larey Dresser, MD;  Location: [place] [facility];  Service: Cardiovascular;  Laterality: N/A;   CARDIOVERSION  04/18/2012   Procedure: CARDIOVERSION;  Surgeon: Fay Records, MD;  Location: [place];  Service: Cardiovascular;  Laterality: N/A;   CARDIOVERSION  04/25/2012   Procedure: CARDIOVERSION;  Surgeon: Thayer Headings, MD;  Location: [place];  Service: Cardiovascular;  Laterality: N/A;   CARDIOVERSION  04/25/2012   Procedure: CARDIOVERSION;  Surgeon: Fay Records, MD;  Location: [place];  Service: Cardiovascular;  Laterality: N/A;   CARDIOVERSION  05/09/2012   Procedure: CARDIOVERSION;  Surgeon: Sherren Mocha, MD;   Location: [place];  Service: Cardiovascular;  Laterality: N/A;  changed from crenshaw to cooper by trish/leone-endo   CARDIOVERSION N/A 03/15/2015   Procedure: CARDIOVERSION;  Surgeon: Thayer Headings, MD;  Location: [place];  Service: Cardiovascular;  Laterality: N/A;   COLONOSCOPY     COLONOSCOPY WITH PROPOFOL N/A 10/21/2014   Procedure: COLONOSCOPY WITH PROPOFOL;  Surgeon: Ladene Artist, MD;  Location: [facility];  Service: Endoscopy;  Laterality: N/A;   EP IMPLANTABLE DEVICE N/A 04/06/2015   Procedure: Loop Recorder Insertion;  Surgeon: Evans Lance, MD;  Location: [place] [facility];  Service: Cardiovascular;  Laterality: N/A;   ESOPHAGOGASTRODUODENOSCOPY     JOINT REPLACEMENT     LOOP RECORDER REMOVAL N/A 09/18/2017   Procedure: LOOP RECORDER REMOVAL;  Surgeon: Deboraha Sprang, MD;  Location: [place] [facility];  Service: Cardiovascular;  Laterality: N/A;   RIGHT/LEFT HEART CATH AND CORONARY ANGIOGRAPHY N/A 01/28/2017   Procedure: Right/Left Heart Cath and Coronary Angiography;  Surgeon: Larey Dresser, MD;  Location: [place] [facility];  Service: Cardiovascular;  Laterality: N/A;   TEE WITHOUT CARDIOVERSION  04/25/2012   Procedure: TRANSESOPHAGEAL ECHOCARDIOGRAM (TEE);  Surgeon: Thayer Headings, MD;  Location: [place];  Service: Cardiovascular;  Laterality: N/A;   TEE WITHOUT CARDIOVERSION N/A 03/15/2015   Procedure: TRANSESOPHAGEAL ECHOCARDIOGRAM (TEE);  Surgeon: Thayer Headings, MD;  Location: [place];  Service: Cardiovascular;  Laterality: N/A;   TONSILLECTOMY AND ADENOIDECTOMY     TOTAL KNEE ARTHROPLASTY Right 06/15/2014   Procedure: TOTAL KNEE ARTHROPLASTY;  Surgeon: Renette Butters, MD;  Location: [place];  Service: Orthopedics;  Laterality: Right;    reports that he quit smoking about 13 years ago. His smoking use included cigarettes. He has a 60.00 pack-year smoking history. He has never used smokeless tobacco. He reports that he does not drink alcohol and does not use  drugs. family history includes Allergies in his mother; Asthma in his maternal grandmother and mother; COPD in his mother; Colon polyps in his mother; Hypothyroidism in his mother. Allergies  Allergen Reactions   Amiodarone Other (See Comments)    hyperthyroidism   Statins Other (See Comments)    myalgia   Tape Other (See Comments)    Skin Tears Use Paper Tape Only   Current Outpatient Medications on File Prior to Visit  Medication Sig Dispense Refill   acetaminophen (TYLENOL) 500 MG tablet Take 500 mg by mouth every 6 (six) hours as needed.     albuterol (VENTOLIN HFA) 108 (90 Base) MCG/ACT inhaler INHALE 2 PUFFS 4  TIMES A DAY as needed 54 g 3   ALPRAZolam (XANAX) 0.5 MG tablet TAKE 2 TABLETS BY MOUTH AT BEDTIME 60 tablet 2   AMBULATORY NON FORMULARY MEDICATION Rolling walker with a bench 1 Units 0   apixaban (ELIQUIS) 5 MG TABS tablet Take 1 tablet (5 mg total) by mouth 2 (two) times daily. 60 tablet 0   augmented betamethasone dipropionate (DIPROLENE-AF) 0.05 % cream Apply 1 application topically daily.     carisoprodol (SOMA) 350 MG tablet TAKE 1 TABLET BY MOUTH THREE TIMES A DAY AS NEEDED FOR MUSCLE SPASMS 90 tablet 5   Cholecalciferol 100 MCG (4000 UT) CAPS 1 tab by mouth once daily 30 capsule 99   dapagliflozin propanediol (FARXIGA) 10 MG TABS tablet Take 1 tablet (10 mg total) by mouth daily before breakfast. 30 tablet 11   digoxin (LANOXIN) 0.25 MG tablet TAKE 1/2 TABLET BY MOUTH EVERY DAY 45 tablet 3   doxepin (SINEQUAN) 10 MG capsule TAKE 2 CAPSULES (20 MG TOTAL) BY MOUTH AT BEDTIME AS NEEDED. 180 capsule 1   Dupilumab (DUPIXENT) 300 MG/2ML SOPN Inject 1 Dose into the skin every 14 (fourteen) days.     Evolocumab (REPATHA SURECLICK) [id number] MG/ML SOAJ Inject 1 pen into the skin every 14 (fourteen) days. 6 mL 3   fluticasone (FLONASE) 50 MCG/ACT nasal spray PLACE 2 SPRAYS INTO BOTH NOSTRILS DAILY AS NEEDED FOR ALLERGIES OR RHINITIS. 48 mL 1   hydrOXYzine (ATARAX/VISTARIL) 10 MG  tablet TAKE 1 TABLET BY MOUTH 3 TIMES DAILY AS NEEDED FOR ITCHING. 60 tablet 2   ketoconazole (NIZORAL) 2 % cream Apply 1 application topically daily. 30 g 1   lidocaine (LIDODERM) 5 % APPLY 1 PATCH TO AFFECTED AREA FOR UP TO 12 HOURS . DO NOT USE MORE THAN ONE PATCH IN 24 HOURS. 90 patch 2   metolazone (ZAROXOLYN) 2.5 MG tablet Take 1 tablet (2.5 mg total) by mouth as directed. With additional 40 meq of potassium 5 tablet 0   omeprazole (PRILOSEC) 20 MG capsule TAKE 1 CAPSULE BY MOUTH TWICE A DAY 180 capsule 1   potassium chloride SA (KLOR-CON) 20 MEQ tablet Take 1 tablet (20 mEq total) by mouth daily. 90 tablet 3   tamsulosin (FLOMAX) 0.4 MG CAPS capsule Take 0.4 mg by mouth daily.     traMADol (ULTRAM) 50 MG tablet TAKE 1 TABLET BY MOUTH DAILY AS NEEDED. 30 tablet 5   traZODone (DESYREL) 100 MG tablet TAKE 1 TABLET BY MOUTH EVERY DAY AT BEDTIME AS NEEDED 90 tablet 1   TUMS 500 MG chewable tablet Chew 500-2,000 mg by mouth every 4 (four) hours as needed for indigestion or heartburn.      venlafaxine XR (EFFEXOR-XR) 75 MG 24 hr capsule TAKE 1 CAPSULE BY MOUTH DAILY WITH BREAKFAST. 90 capsule 1   zolpidem (AMBIEN) 10 MG tablet TAKE 1 TABLET BY MOUTH AT BEDTIME AS NEEDED 90 tablet 1   No current facility-administered medications on file prior to visit.        ROS:  All others reviewed and negative.  Objective        PE:  BP 98/62 (BP Location: Left Arm, Patient Position: Sitting, Cuff Size: Large)   Pulse 78   Temp 97.7 F (36.5 C) (Oral)   Ht 5' 7.25" (1.708 m)   Wt 247 lb (112 kg)   SpO2 95%   BMI 38.40 kg/m                 Constitutional: Pt  appears in NAD               HENT: Head: NCAT.                Right Ear: External ear normal.                 Left Ear: External ear normal.                Eyes: . Pupils are equal, round, and reactive to light. Conjunctivae and EOM are normal; left upper dental with cracked molar without red, tender, swelling, fluctuance or drainage                Nose: without d/c or deformity               Neck: Neck supple. Gross normal ROM               Cardiovascular: Normal rate and regular rhythm.                 Pulmonary/Chest: Effort normal and breath sounds without rales or wheezing.                               Neurological: Pt is alert. At baseline orientation, motor grossly intact               Skin: Skin is warm. No rashes, no other new lesions, LE edema - trace bilateral               Psychiatric: Pt behavior is normal without agitation   Micro: none  Cardiac tracings I have personally interpreted today:  none  Pertinent Radiological findings (summarize): none   Lab Results  Component Value Date   WBC 7.9 05/17/2021   HGB 9.6 (L) 05/17/2021   HCT 30.6 (L) 05/17/2021   PLT 288 05/17/2021   GLUCOSE 83 06/09/2021   CHOL 153 02/28/2021   TRIG 335.0 (H) 02/28/2021   HDL 33.60 (L) 02/28/2021   LDLDIRECT 72.0 02/28/2021   LDLCALC 162 (H) 10/20/2020   ALT 15 05/05/2021   AST 17 05/05/2021   NA 137 06/09/2021   K 3.7 06/09/2021   CL 103 06/09/2021   CREATININE 1.22 06/09/2021   BUN 23 06/09/2021   CO2 25 06/09/2021   TSH 1.46 02/28/2021   PSA 2.26 02/28/2021   INR 1.1 05/08/2021   HGBA1C 5.5 05/05/2021   MICROALBUR 0.8 02/28/2021   Assessment/Plan:  [name] is a 65 y.o. White or Caucasian [1] male with  has a past medical history of AICD (automatic cardioverter/defibrillator) present, Anemia, ANXIETY, Arthritis, Asthma, Atrial fibrillation ([place]) (09/06/2015), Cardiomyopathy ([place]), CHF (congestive heart failure) ([place]), Chronic constipation, COPD (chronic obstructive pulmonary disease) ([place]), DEPRESSION, Diverticulitis, DYSKINESIA, ESOPHAGUS, Emphysema of lung ([place]), Essential hypertension, FIBROMYALGIA, GERD (gastroesophageal reflux disease), Glaucoma, HYPERLIPIDEMIA, INSOMNIA, Myocardial infarction [facility]), Myocardial infarction ([name]), O2 dependent, Paroxysmal atrial fibrillation ([place]), Peripheral neuropathy,  Pneumonia (12/2016), Rash and other nonspecific skin eruption (04/12/2009), SLEEP APNEA, OBSTRUCTIVE, Syncope, and Type II diabetes mellitus ([place]).  Tooth fracture Recent onset, no evidenc for worsening dental infection, ok for triam dental paste, and f/u dental asap  Type 2 diabetes mellitus ([place]) Lab Results  Component Value Date   HGBA1C 5.5 05/05/2021   Stable, pt to continue current medical treatment farxiga   Essential hypertension BP Readings from Last 3 Encounters:  06/09/21 96/60  06/08/21 98/62  06/07/21 120/78   Low  normal, asymptomatic, pt to continue medical treatment losartan  Followup: Return if symptoms worsen or fail to improve.  [name], MD 06/11/2021 3:38 PM [facility]

## 2021-06-09 ENCOUNTER — Ambulatory Visit (HOSPITAL_COMMUNITY)
Admission: RE | Admit: 2021-06-09 | Discharge: 2021-06-09 | Disposition: A | Discharge status: Home / Self Care | Payer: Medicare HMO | Source: Ambulatory Visit | Attending: Family Medicine | Admitting: Family Medicine

## 2021-06-09 ENCOUNTER — Encounter (HOSPITAL_COMMUNITY): Payer: Self-pay

## 2021-06-09 VITALS — BP 96/60 | HR 77 | Wt 245.6 lb

## 2021-06-09 DIAGNOSIS — I110 Hypertensive heart disease with heart failure: Secondary | ICD-10-CM | POA: Diagnosis not present

## 2021-06-09 DIAGNOSIS — M1712 Unilateral primary osteoarthritis, left knee: Secondary | ICD-10-CM | POA: Insufficient documentation

## 2021-06-09 DIAGNOSIS — E781 Pure hyperglyceridemia: Secondary | ICD-10-CM | POA: Insufficient documentation

## 2021-06-09 DIAGNOSIS — G4733 Obstructive sleep apnea (adult) (pediatric): Secondary | ICD-10-CM | POA: Insufficient documentation

## 2021-06-09 DIAGNOSIS — Z79899 Other long term (current) drug therapy: Secondary | ICD-10-CM | POA: Diagnosis not present

## 2021-06-09 DIAGNOSIS — Z8616 Personal history of COVID-19: Secondary | ICD-10-CM | POA: Insufficient documentation

## 2021-06-09 DIAGNOSIS — I442 Atrioventricular block, complete: Secondary | ICD-10-CM | POA: Diagnosis not present

## 2021-06-09 DIAGNOSIS — J449 Chronic obstructive pulmonary disease, unspecified: Secondary | ICD-10-CM

## 2021-06-09 DIAGNOSIS — Z7984 Long term (current) use of oral hypoglycemic drugs: Secondary | ICD-10-CM | POA: Insufficient documentation

## 2021-06-09 DIAGNOSIS — I2510 Atherosclerotic heart disease of native coronary artery without angina pectoris: Secondary | ICD-10-CM | POA: Diagnosis not present

## 2021-06-09 DIAGNOSIS — N63 Unspecified lump in unspecified breast: Secondary | ICD-10-CM | POA: Diagnosis not present

## 2021-06-09 DIAGNOSIS — I5022 Chronic systolic (congestive) heart failure: Secondary | ICD-10-CM | POA: Insufficient documentation

## 2021-06-09 DIAGNOSIS — Z9981 Dependence on supplemental oxygen: Secondary | ICD-10-CM | POA: Diagnosis not present

## 2021-06-09 DIAGNOSIS — N644 Mastodynia: Secondary | ICD-10-CM | POA: Diagnosis not present

## 2021-06-09 DIAGNOSIS — I4819 Other persistent atrial fibrillation: Secondary | ICD-10-CM | POA: Diagnosis not present

## 2021-06-09 DIAGNOSIS — I4821 Permanent atrial fibrillation: Secondary | ICD-10-CM | POA: Diagnosis not present

## 2021-06-09 DIAGNOSIS — Z87891 Personal history of nicotine dependence: Secondary | ICD-10-CM | POA: Diagnosis not present

## 2021-06-09 DIAGNOSIS — R7989 Other specified abnormal findings of blood chemistry: Secondary | ICD-10-CM | POA: Insufficient documentation

## 2021-06-09 DIAGNOSIS — I428 Other cardiomyopathies: Secondary | ICD-10-CM | POA: Insufficient documentation

## 2021-06-09 DIAGNOSIS — Z7901 Long term (current) use of anticoagulants: Secondary | ICD-10-CM | POA: Insufficient documentation

## 2021-06-09 DIAGNOSIS — Z4502 Encounter for adjustment and management of automatic implantable cardiac defibrillator: Secondary | ICD-10-CM | POA: Diagnosis not present

## 2021-06-09 LAB — BASIC METABOLIC PANEL
Anion gap: 9 (ref 5–15)
BUN: 23 mg/dL (ref 8–23)
CO2: 25 mmol/L (ref 22–32)
Calcium: 8.8 mg/dL — ABNORMAL LOW (ref 8.9–10.3)
Chloride: 103 mmol/L (ref 98–111)
Creatinine, Ser: 1.22 mg/dL (ref 0.61–1.24)
GFR, Estimated: >60 mL/min (ref >60)
Glucose, Bld: 83 mg/dL (ref 70–99)
Potassium: 3.7 mmol/L (ref 3.5–5.1)
Sodium: 137 mmol/L (ref 135–145)

## 2021-06-09 MED ORDER — EPLERENONE 25 MG PO TABS: 25.0000 mg | ORAL_TABLET | Freq: Every day | ORAL | 3 refills | Status: DC

## 2021-06-09 MED ORDER — LOSARTAN POTASSIUM 25 MG PO TABS: 12.5000 mg | ORAL_TABLET | Freq: Every day | ORAL | 3 refills | Status: DC

## 2021-06-09 NOTE — Patient Instructions (Signed)
STOP Entresto  START Losartan 12.5 mg one half tab daily at bedtime  STOP Spironolactone  START Inspra 25 mg, one tab daily  Labs today We will only contact you if something comes back abnormal or we need to make some changes. Otherwise no news is good news!  You have been referred to [facility] -they will contact you for an appointment  Your physician recommends that you schedule a follow-up appointment in: 4 weeks with the pharmacy team and in 2-3 months with Dr [name]  Do the following things EVERYDAY: Weigh yourself in the morning before breakfast. Write it down and keep it in a log. Take your medicines as prescribed Eat low salt foods--Limit salt (sodium) to 2000 mg per day.  Stay as active as you can everyday Limit all fluids for the day to less than 2 liters

## 2021-06-11 ENCOUNTER — Encounter: Payer: Self-pay | Admitting: Internal Medicine

## 2021-06-11 DIAGNOSIS — K089 Disorder of teeth and supporting structures, unspecified: Secondary | ICD-10-CM | POA: Insufficient documentation

## 2021-06-11 DIAGNOSIS — S025XXA Fracture of tooth (traumatic), initial encounter for closed fracture: Secondary | ICD-10-CM | POA: Insufficient documentation

## 2021-06-11 NOTE — Assessment & Plan Note (Signed)
Lab Results  Component Value Date   HGBA1C 5.5 05/05/2021   Stable, pt to continue current medical treatment farxiga

## 2021-06-11 NOTE — Assessment & Plan Note (Addendum)
BP Readings from Last 3 Encounters:  06/09/21 96/60  06/08/21 98/62  06/07/21 120/78   Low normal, asymptomatic, pt to continue medical treatment losartan

## 2021-06-11 NOTE — Assessment & Plan Note (Signed)
Recent onset, no evidenc for worsening dental infection, ok for triam dental paste, and f/u dental asap

## 2021-06-12 ENCOUNTER — Telehealth (HOSPITAL_COMMUNITY): Payer: Self-pay | Admitting: *Deleted

## 2021-06-12 NOTE — Telephone Encounter (Addendum)
Pt left VM stating his bp has been running low 90s/40s and he feels weak. No other complaints at this time. Bp today 90/46.  Called pt back to get more information no answer/left vm requesting return call.

## 2021-06-12 NOTE — Telephone Encounter (Signed)
Pt returned call pt denies dizziness but said low bp has him feeling tired and weak. Pt doent check bp daily but checked it the last few days because of weakness. Pt wants to know if he needs to make any medication changes.   Routed to [employer] for advice

## 2021-06-12 NOTE — Telephone Encounter (Signed)
Left very detailed vm and requested a call back.

## 2021-06-13 ENCOUNTER — Ambulatory Visit: Payer: Self-pay

## 2021-06-13 ENCOUNTER — Ambulatory Visit: Payer: Medicare HMO | Admitting: Family Medicine

## 2021-06-13 ENCOUNTER — Other Ambulatory Visit: Payer: Self-pay

## 2021-06-13 ENCOUNTER — Encounter: Payer: Self-pay | Admitting: Family Medicine

## 2021-06-13 VITALS — BP 116/74 | HR 77 | Ht 67.0 in | Wt 243.0 lb

## 2021-06-13 DIAGNOSIS — M75101 Unspecified rotator cuff tear or rupture of right shoulder, not specified as traumatic: Secondary | ICD-10-CM | POA: Diagnosis not present

## 2021-06-13 DIAGNOSIS — M12811 Other specific arthropathies, not elsewhere classified, right shoulder: Secondary | ICD-10-CM | POA: Diagnosis not present

## 2021-06-13 DIAGNOSIS — M1712 Unilateral primary osteoarthritis, left knee: Secondary | ICD-10-CM

## 2021-06-13 DIAGNOSIS — M25512 Pain in left shoulder: Secondary | ICD-10-CM | POA: Diagnosis not present

## 2021-06-13 DIAGNOSIS — M7072 Other bursitis of hip, left hip: Secondary | ICD-10-CM | POA: Insufficient documentation

## 2021-06-13 DIAGNOSIS — M7062 Trochanteric bursitis, left hip: Secondary | ICD-10-CM | POA: Diagnosis not present

## 2021-06-13 DIAGNOSIS — M12812 Other specific arthropathies, not elsewhere classified, left shoulder: Secondary | ICD-10-CM | POA: Diagnosis not present

## 2021-06-13 DIAGNOSIS — G8929 Other chronic pain: Secondary | ICD-10-CM | POA: Diagnosis not present

## 2021-06-13 NOTE — Progress Notes (Signed)
[name] [address] [place] Phone: [phone] Subjective:   [name], am serving as a scribe for Dr. Hulan Saas.  This visit occurred during the SARS-CoV-2 public health emergency.  Safety protocols were in place, including screening questions prior to the visit, additional usage of staff PPE, and extensive cleaning of exam room while observing appropriate contact time as indicated for disinfecting solutions.   I'm seeing this patient by the request  of:  [name], MD  CC: Left shoulder and left hip pain  [id number]  05/16/2021 Patient given injection and tolerated procedure well.  Discussed icing regimen and home exercise, discussed avoiding certain activities.  Increase activity slowly.  Follow-up with me again in 6 to 8 weeks.  We will consider possibly repeating on the contralateral side.  Severe arthritic changes and injection given today.  Tolerated the procedure well.  Patient hopefully will make significant improvement.  Was to have replacement but does need his other medical problems more stable at this time.  Recent gastric hemorrhage noted the patient was in the hospital.  Patient is still on Eliquis.  Following up with cardiology as well as his primary care physician.  Encouraged to avoid any anti-inflammatories otherwise.  At this point do feel that the intra-articular injections would be the safest but did limited to 2 injections today.  Follow-up again in 4 weeks.  Update 06/13/2021 [name] is a 65 y.o. male coming in with complaint of right shoulder and left knee pain. Patient states that he continues to have pain in these areas.  Given injection last 1.  Patient did have a gastric hemorrhage noted at the hospital before we saw him.  Patient is following up with his cardiologist and they are monitoring the hemoglobin.      Past Medical History:  Diagnosis Date   AICD (automatic  cardioverter/defibrillator) present    Anemia    supposed to be taking Vit B but doesn't   ANXIETY    takes Xanax nightly   Arthritis    Asthma    Albuterol prn and Advair daily;also takes Prednisone daily   Atrial fibrillation ([place]) 09/06/2015   Cardiomyopathy ([place])    a. EF 25% TEE July 2013; b. EF normalized 2015;  c. 03/2015 Echo: EF 40-45%, difrf HK, PASP 38 mmHg, Mild MR, sev LAE/RAE.   CHF (congestive heart failure) (HCC)    Chronic constipation    takes OTC stool softener   COPD (chronic obstructive pulmonary disease) (HCC)    "one dr says COPD; one dr says emphysema" (09/18/2017)   DEPRESSION    takes Zoloft and Doxepin daily   Diverticulitis    DYSKINESIA, ESOPHAGUS    Emphysema of lung ([place])    "one dr says COPD; one dr says emphysema" (09/18/2017)   Essential hypertension        FIBROMYALGIA    GERD (gastroesophageal reflux disease)        Glaucoma    HYPERLIPIDEMIA    a. Intolerant to statins.   INSOMNIA    takes Ambien nightly   Myocardial infarction [facility])    a. 2012 Myoview notable for prior infarct;  b. 03/2015 Lexiscan CL: EF 37%, diff HK, small area of inferior infarct from apex to base-->Med Rx.   Myocardial infarction ([place])    O2 dependent    "2.5L q hs & prn" (09/18/2017)   Paroxysmal atrial fibrillation (HCC)    a. CHA2DS2VASc = 3--> takes  Coumadin;  b. 03/15/2015 Successful TEE/DCCV;  c. 03/2015 recurrent afib, Amio d/c'd in setting of hyperthyroidism.   Peripheral neuropathy    Pneumonia 12/2016   Rash and other nonspecific skin eruption 04/12/2009   no cause found saw dermatologists x 2 and allergist   SLEEP APNEA, OBSTRUCTIVE    a. doesn't use CPAP   Syncope    a. 03/2015 s/p MDT LINQ.   Type II diabetes mellitus ([place])        Past Surgical History:  Procedure Laterality Date   ACNE CYST REMOVAL     2 on back    AV NODE ABLATION N/A 10/25/2017   Procedure: AV NODE ABLATION;  Surgeon: Deboraha Sprang, MD;  Location: [place] [facility];  Service:  Cardiovascular;  Laterality: N/A;   BIV ICD INSERTION CRT-D N/A 09/18/2017   Procedure: BIV ICD INSERTION CRT-D;  Surgeon: Deboraha Sprang, MD;  Location: [place] [facility];  Service: Cardiovascular;  Laterality: N/A;   CARDIAC CATHETERIZATION N/A 03/21/2016   Procedure: Right/Left Heart Cath and Coronary Angiography;  Surgeon: Larey Dresser, MD;  Location: [place] [facility];  Service: Cardiovascular;  Laterality: N/A;   CARDIOVERSION  04/18/2012   Procedure: CARDIOVERSION;  Surgeon: Fay Records, MD;  Location: [place];  Service: Cardiovascular;  Laterality: N/A;   CARDIOVERSION  04/25/2012   Procedure: CARDIOVERSION;  Surgeon: Thayer Headings, MD;  Location: [place];  Service: Cardiovascular;  Laterality: N/A;   CARDIOVERSION  04/25/2012   Procedure: CARDIOVERSION;  Surgeon: Fay Records, MD;  Location: [place];  Service: Cardiovascular;  Laterality: N/A;   CARDIOVERSION  05/09/2012   Procedure: CARDIOVERSION;  Surgeon: Sherren Mocha, MD;  Location: [place];  Service: Cardiovascular;  Laterality: N/A;  changed from crenshaw to cooper by trish/leone-endo   CARDIOVERSION N/A 03/15/2015   Procedure: CARDIOVERSION;  Surgeon: Thayer Headings, MD;  Location: [place];  Service: Cardiovascular;  Laterality: N/A;   COLONOSCOPY     COLONOSCOPY WITH PROPOFOL N/A 10/21/2014   Procedure: COLONOSCOPY WITH PROPOFOL;  Surgeon: Ladene Artist, MD;  Location: [facility];  Service: Endoscopy;  Laterality: N/A;   EP IMPLANTABLE DEVICE N/A 04/06/2015   Procedure: Loop Recorder Insertion;  Surgeon: Evans Lance, MD;  Location: [place] [facility];  Service: Cardiovascular;  Laterality: N/A;   ESOPHAGOGASTRODUODENOSCOPY     JOINT REPLACEMENT     LOOP RECORDER REMOVAL N/A 09/18/2017   Procedure: LOOP RECORDER REMOVAL;  Surgeon: Deboraha Sprang, MD;  Location: [place] [facility];  Service: Cardiovascular;  Laterality: N/A;   RIGHT/LEFT HEART CATH AND CORONARY ANGIOGRAPHY N/A 01/28/2017   Procedure: Right/Left Heart  Cath and Coronary Angiography;  Surgeon: Larey Dresser, MD;  Location: [place] [facility];  Service: Cardiovascular;  Laterality: N/A;   TEE WITHOUT CARDIOVERSION  04/25/2012   Procedure: TRANSESOPHAGEAL ECHOCARDIOGRAM (TEE);  Surgeon: Thayer Headings, MD;  Location: [place];  Service: Cardiovascular;  Laterality: N/A;   TEE WITHOUT CARDIOVERSION N/A 03/15/2015   Procedure: TRANSESOPHAGEAL ECHOCARDIOGRAM (TEE);  Surgeon: Thayer Headings, MD;  Location: [place];  Service: Cardiovascular;  Laterality: N/A;   TONSILLECTOMY AND ADENOIDECTOMY     TOTAL KNEE ARTHROPLASTY Right 06/15/2014   Procedure: TOTAL KNEE ARTHROPLASTY;  Surgeon: Renette Butters, MD;  Location: [place];  Service: Orthopedics;  Laterality: Right;   Social History   Socioeconomic History   Marital status: Divorced    Spouse name: Not on file   Number of children: 2  Years of education: Not on file   Highest education level: Not on file  Occupational History   Occupation: retired/disabled. prev worked in Therapist, sports.    Employer: DISABLED  Tobacco Use   Smoking status: Former    Packs/day: 2.00    Years: 30.00    Pack years: 60.00    Types: Cigarettes    Quit date: 10/16/2007    Years since quitting: 13.6   Smokeless tobacco: Never  Vaping Use   Vaping Use: Never used  Substance and Sexual Activity   Alcohol use: No   Drug use: No   Sexual activity: Not Currently  Other Topics Concern   Not on file  Social History Narrative   Lives alone.   Social Determinants of Health   Financial Resource Strain: Low Risk    Difficulty of Paying Living Expenses: Not hard at all  Food Insecurity: No Food Insecurity   Worried About Charity fundraiser in the Last Year: Never true   [place] in the Last Year: Never true  Transportation Needs: No Transportation Needs   Lack of Transportation (Medical): No   Lack of Transportation (Non-Medical): No  Physical Activity: Inactive   Days of Exercise per  Week: 0 days   Minutes of Exercise per Session: 0 min  Stress: No Stress Concern Present   Feeling of Stress : Not at all  Social Connections: Moderately Integrated   Frequency of Communication with Friends and Family: More than three times a week   Frequency of Social Gatherings with Friends and Family: More than three times a week   Attends Religious Services: More than 4 times per year   Active Member of [employer] or Organizations: Yes   Attends Music therapist: More than 4 times per year   Marital Status: Never married   Allergies  Allergen Reactions   Amiodarone Other (See Comments)    hyperthyroidism   Statins Other (See Comments)    myalgia   Tape Other (See Comments)    Skin Tears Use Paper Tape Only   Family History  Problem Relation Age of Onset   COPD Mother    Asthma Mother    Colon polyps Mother    Allergies Mother    Hypothyroidism Mother    Asthma Maternal Grandmother    Colon cancer Neg Hx     Current Outpatient Medications (Endocrine & Metabolic):    dapagliflozin propanediol (FARXIGA) 10 MG TABS tablet, Take 1 tablet (10 mg total) by mouth daily before breakfast.  Current Outpatient Medications (Cardiovascular):    digoxin (LANOXIN) 0.25 MG tablet, TAKE 1/2 TABLET BY MOUTH EVERY DAY   eplerenone (INSPRA) 25 MG tablet, Take 1 tablet (25 mg total) by mouth daily.   Evolocumab (REPATHA SURECLICK) [id number] MG/ML SOAJ, Inject 1 pen into the skin every 14 (fourteen) days.   losartan (COZAAR) 25 MG tablet, Take 0.5 tablets (12.5 mg total) by mouth daily.   metolazone (ZAROXOLYN) 2.5 MG tablet, Take 1 tablet (2.5 mg total) by mouth as directed. With additional 40 meq of potassium   torsemide (DEMADEX) 20 MG tablet, Take 20 mg by mouth. Patient is taking 4 tablets in the morning and 3 tablets in the evening.  Current Outpatient Medications (Respiratory):    albuterol (VENTOLIN HFA) 108 (90 Base) MCG/ACT inhaler, INHALE 2 PUFFS 4 TIMES A DAY as needed    fluticasone (FLONASE) 50 MCG/ACT nasal spray, PLACE 2 SPRAYS INTO BOTH NOSTRILS DAILY AS NEEDED FOR ALLERGIES OR  RHINITIS.  Current Outpatient Medications (Analgesics):    acetaminophen (TYLENOL) 500 MG tablet, Take 500 mg by mouth every 6 (six) hours as needed.   traMADol (ULTRAM) 50 MG tablet, TAKE 1 TABLET BY MOUTH DAILY AS NEEDED.  Current Outpatient Medications (Hematological):    apixaban (ELIQUIS) 5 MG TABS tablet, Take 1 tablet (5 mg total) by mouth 2 (two) times daily.  Current Outpatient Medications (Other):    ALPRAZolam (XANAX) 0.5 MG tablet, TAKE 2 TABLETS BY MOUTH AT BEDTIME   AMBULATORY NON FORMULARY MEDICATION, Rolling walker with a bench   augmented betamethasone dipropionate (DIPROLENE-AF) 0.05 % cream, Apply 1 application topically daily.   carisoprodol (SOMA) 350 MG tablet, TAKE 1 TABLET BY MOUTH THREE TIMES A DAY AS NEEDED FOR MUSCLE SPASMS   Cholecalciferol 100 MCG (4000 UT) CAPS, 1 tab by mouth once daily   doxepin (SINEQUAN) 10 MG capsule, TAKE 2 CAPSULES (20 MG TOTAL) BY MOUTH AT BEDTIME AS NEEDED.   Dupilumab (DUPIXENT) 300 MG/2ML SOPN, Inject 1 Dose into the skin every 14 (fourteen) days.   hydrOXYzine (ATARAX/VISTARIL) 10 MG tablet, TAKE 1 TABLET BY MOUTH 3 TIMES DAILY AS NEEDED FOR ITCHING.   ketoconazole (NIZORAL) 2 % cream, Apply 1 application topically daily.   lidocaine (LIDODERM) 5 %, APPLY 1 PATCH TO AFFECTED AREA FOR UP TO 12 HOURS . DO NOT USE MORE THAN ONE PATCH IN 24 HOURS.   omeprazole (PRILOSEC) 20 MG capsule, TAKE 1 CAPSULE BY MOUTH TWICE A DAY   potassium chloride SA (KLOR-CON) 20 MEQ tablet, Take 1 tablet (20 mEq total) by mouth daily.   tamsulosin (FLOMAX) 0.4 MG CAPS capsule, Take 0.4 mg by mouth daily.   traZODone (DESYREL) 100 MG tablet, TAKE 1 TABLET BY MOUTH EVERY DAY AT BEDTIME AS NEEDED   triamcinolone (KENALOG) 0.1 % paste, Use as directed in the mouth or throat 2 (two) times daily.   TUMS 500 MG chewable tablet, Chew 500-2,000 mg by mouth  every 4 (four) hours as needed for indigestion or heartburn.    venlafaxine XR (EFFEXOR-XR) 75 MG 24 hr capsule, TAKE 1 CAPSULE BY MOUTH DAILY WITH BREAKFAST.   zolpidem (AMBIEN) 10 MG tablet, TAKE 1 TABLET BY MOUTH AT BEDTIME AS NEEDED   Reviewed prior external information including notes and imaging from  primary care provider this includes patient's most recent hemoglobin of 9.5.  Reviewed quickly the hospitalization for the gastric hemorrhage. As well as notes that were available from care everywhere and other healthcare systems.  Past medical history, social, surgical and family history all reviewed in electronic medical record.  No pertanent information unless stated regarding to the chief complaint.   Review of Systems:  No headache, visual changes, nausea, vomiting, diarrhea, constipation, dizziness, abdominal pain, skin rash, fevers, chills, night sweats, weight loss, swollen lymph nodes,  chest pain, shortness of breath, mood changes. POSITIVE muscle aches, body aches, joint swelling  Objective  Blood pressure 116/74, pulse 77, weight 243 lb (110.2 kg), SpO2 98 %.   General: No apparent distress alert and oriented x3 mood and affect normal, dressed appropriately.  HEENT: Pupils equal, extraocular movements intact  Respiratory: Patient's speak in full sentences and does not appear short of breath  Severely antalgic gait noted. Favoring the left knee still.  Tender to palpation over the left greater trochanteric area.  Mild limited range of motion in flexion and extension.  Mild pain with resisted flexion of the hip.  Patient does have tenderness to palpation of the left  sacroiliac joint as well.  Left shoulder exam does have mild atrophy noted.  Mild crepitus.  4 out of 5 strength of the rotator cuff.  Procedure: Real-time Ultrasound Guided Injection of left glenohumeral joint Device: GE Logiq E  Ultrasound guided injection is preferred based studies that show increased duration,  increased effect, greater accuracy, decreased procedural pain, increased response rate with ultrasound guided versus blind injection.  Verbal informed consent obtained.  Time-out conducted.  Noted no overlying erythema, induration, or other signs of local infection.  Skin prepped in a sterile fashion.  Local anesthesia: Topical Ethyl chloride.  With sterile technique and under real time ultrasound guidance:  Joint visualized.  21g 2 inch needle inserted posterior approach. Pictures taken for needle placement. Patient did have injection of 2 cc of 0.5% Marcaine, and 1cc of Kenalog 40 mg/dL. Completed without difficulty  Pain immediately resolved suggesting accurate placement of the medication.  Advised to call if fevers/chills, erythema, induration, drainage, or persistent bleeding.  Impression: Technically successful ultrasound guided injection.   Procedure: Real-time Ultrasound Guided Injection of left  greater trochanteric bursitis secondary to patient's body habitus Device: GE Logiq Q7  Ultrasound guided injection is preferred based studies that show increased duration, increased effect, greater accuracy, decreased procedural pain, increased response rate, and decreased cost with ultrasound guided versus blind injection.  Verbal informed consent obtained.  Time-out conducted.  Noted no overlying erythema, induration, or other signs of local infection.  Skin prepped in a sterile fashion.  Local anesthesia: Topical Ethyl chloride.  With sterile technique and under real time ultrasound guidance:  Greater trochanteric area was visualized and patient's bursa was noted. A 22-gauge 3 inch needle was inserted and 4 cc of 0.5% Marcaine and 1 cc of Kenalog 40 mg/dL was injected. Pictures taken Completed without difficulty  Pain immediately resolved suggesting accurate placement of the medication.  Advised to call if fevers/chills, erythema, induration, drainage, or persistent bleeding.  Impression:  Technically successful ultrasound guided injection.   Impression and Recommendations:     The above documentation has been reviewed and is accurate and complete Lyndal Pulley, DO

## 2021-06-13 NOTE — Assessment & Plan Note (Signed)
Continued pain at this time.  Discussed with patient to try to spread out injections to at least 8 to 10 weeks.  Patient has been losing weight and is under a BMI of 40.  Now awaiting the hemoglobin to stabilize.  Patient may have a replacement in the near future.  I do think that this will be beneficial.  Patient will follow up with orthopedic for that otherwise see me again in 4 to 6 weeks and can consider an injection.

## 2021-06-13 NOTE — Assessment & Plan Note (Signed)
Patient given injection and tolerated the procedure well.  Discussed icing regimen and home exercises.  Patient would like to avoid any surgical intervention with this.  Patient's left shoulder though states that he has better shoulder the need to keep it working better.  Discussed icing regimen and home exercises.  Follow-up again 6 to 8 weeks.

## 2021-06-13 NOTE — Patient Instructions (Signed)
Injected left shoulder and left hip See me again 4-6 weeks

## 2021-06-13 NOTE — Assessment & Plan Note (Signed)
Patient seems to have more of a trochanteric bursitis that is likely exacerbated secondary to patient's knee arthritis.  He does need to have a knee replacement but unfortunately was unable to have it done recently.  Patient is still having his hemoglobin followed to see if that stabilizes.  Discussed icing regimen and home exercises.  Discussed avoiding certain activities.  Patient should do relatively well with this and follow-up with me again in 4 to 6 weeks.

## 2021-06-14 ENCOUNTER — Other Ambulatory Visit: Payer: Self-pay | Admitting: Internal Medicine

## 2021-06-14 DIAGNOSIS — I255 Ischemic cardiomyopathy: Secondary | ICD-10-CM

## 2021-06-14 DIAGNOSIS — I4819 Other persistent atrial fibrillation: Secondary | ICD-10-CM

## 2021-06-14 DIAGNOSIS — Z8709 Personal history of other diseases of the respiratory system: Secondary | ICD-10-CM

## 2021-06-14 DIAGNOSIS — J3089 Other allergic rhinitis: Secondary | ICD-10-CM

## 2021-06-15 ENCOUNTER — Telehealth (HOSPITAL_COMMUNITY): Payer: Self-pay | Admitting: Vascular Surgery

## 2021-06-15 NOTE — Telephone Encounter (Signed)
Pt declined scheduling breast ultrasound

## 2021-06-26 ENCOUNTER — Encounter: Payer: Self-pay | Admitting: Internal Medicine

## 2021-06-26 ENCOUNTER — Ambulatory Visit (INDEPENDENT_AMBULATORY_CARE_PROVIDER_SITE_OTHER): Payer: Medicare HMO

## 2021-06-26 ENCOUNTER — Other Ambulatory Visit: Payer: Self-pay | Admitting: Internal Medicine

## 2021-06-26 DIAGNOSIS — I5022 Chronic systolic (congestive) heart failure: Secondary | ICD-10-CM | POA: Diagnosis not present

## 2021-06-26 DIAGNOSIS — Z95810 Presence of automatic (implantable) cardiac defibrillator: Secondary | ICD-10-CM

## 2021-06-26 NOTE — Progress Notes (Signed)
ID:  [name], DOB [birth year], MRN [id number]  Provider location: [facility] Type of Visit: Established patient  PCP:  [name], MD  Cardiologist:  Dr. [name]   History of Present Illness: [name] is a 65 y.o. male who has history of COPD on home oxygen, permanent atrial fibrillation, and chronic systolic CHF.  Amiodarone and DCCV were tried for atrial fibrillation in the past without success.   Patient has been noted to have a low EF since 2013.  EF improved by to normal by 2015 but dropped to 30-35% by 11/16.  Cath in 6/17 showed some CAD but not enough to cause cardiomyopathy.  He was admitted in 3/18 with PNA, atrial fibrillation/RVR, and acute on chronic systolic CHF.  Echo in 3/18 showed fall in EF to 10-15%.   I took him for right and left heart cath in 4/18, which showed nonobstructive CAD and relatively optimized filling pressures.    Syncope in 11/18. Loop recorder showed 6 second pause and periods of complete heart block. He had Medtronic BiV ICD placed in 12/18.  In 1/19, he had AV nodal ablation to promote BiV pacing.   Echo was done 6/19 with EF 25-30% with moderate RV dilation/mildly decreased systolic function. CPX 7/19 was submaximal, probably mild functional impairment.    Echo in 1/21 showed EF 35-40% with diffuse hypokinesis, moderate RV dilation with mildly decreased systolic function.   He was admitted with COVID-19 PNA in 2/19.  He had AKI/hypotension and Entresto was stopped.   Echo in 3/22 showed EF 40% with diffuse hypokinesis, mild RV enlargement, mildly decreased RV systolic function.   Presented to [facility] 7/22 with weakness and dizziness. Found to be in hemorrhagic shock from acute GIB. Hgb 5.0, INR 8.4. He was on Coumadin for atrial fibrillation. He was resuscitated with PRBCs, IVF, and given Vitamin K. His Entresto, beta blocker and torsemide were held due to hypotension. He initially required ICU monitoring due to  hypotension requiring pressors.  GI team was consulted and planned to treat with oral antibiotics for suspected diverticular bleed with plans for outpatient colonoscopy.  Coumadin was transitioned to Eliquis.  He was discharged back on beta blocker, Entresto and SGLT2i. Dig, spiro and torsemide were held at discharge. Weight 259 lbs.  Today he returns for HF follow up. Feeling better since stopping Entresto, no further dizziness or low BP at home. Remains limited physically by knee pain, some SOB with increased physical activity. Denies CP, edema, or PND/Orthopnea. Appetite ok. No fever or chills. Taking all medications. Struggling with digoxin pills getting stuck in his throat causing discomfort. He continues to take ibuprofen daily for knee OA. Denies bleeding.  ICD interrogation (personally reviewed): Fluid index down, thoracic impedence stable, >99% bi-v pacing, no VT/VF, ~1 hr/day activity.  Labs (4/18): pro-BNP 904, K 3.1, creatinine 1.39 => 1.08 with BUN 66 => 37, hgb 14.1.  Labs (5/18): hemoglobin 13.1 Labs (6/18): K 4, creatinine 1.3, BNP 294 Labs (7/18): K 3.8, creatinine 1.23, LDL 96, HDL 42 Labs (8/18): K 4.5, creatinine 1.22 Labs (10/18): digoxin 0.4 Labs (12/18): LDL 115, HDL 37, TGs 214, K 4.2, creatinine 0.84 Labs (1/19): K 4.4, creatinine 0.75, pro-BNP 433 Labs (2/19): digoxin 0.4 Labs (3/19): LDL 121, hgb 12.8, K 4.5, creatinine 0.98 Labs (6/19): LDL 48, HDL 45, K 5, creatinine 0.94, digoxin 0.3 Labs (9/19): K 3.7, creatinine 1.09 => 1.32, digoxin 0.2 Labs (11/19): LDL 115 Labs (12/20): K 3.9, creatinine 0.87,  LDL 94  Labs (3/21): K 4, creatinine 1.08 Labs (6/21): LDL 109 Labs (7/21): K 3.7, creatinine 1.15 Labs (12/21): LDL 129, TGs 239, hgb 12.9, K 3.9, creatinine 0.98, digoxin 0.8 Labs (1/22): ALT 76, AST 40, LDL 162, K 3.8 =>4.6, creatinine 1.16 => 1.12, digoxin 0.7, repeat LFTs normal Labs (5/22): LDL 72, TGs 335, K 4.2, creatinine 1.29 Labs (7/22): K 4.0, creatinine  0.77 Labs (8/22): K 4.0, creatinine 1.11  PMH: 1. Asthma 2. COPD: On home oxygen. Prior smoker.  3. Atrial fibrillation: Permanent.  He failed DCCV and amiodarone in the past . 4. HTN 5. GERD 6. Hyperlipidemia: Myalgias with atorvastatin 7. BPH.  8. Chronic systolic CHF: Nonischemic cardiomyopathy.  - EF 25% by TEE in 7/13.  Normalized on 2015 echo. - Echo (11/16): EF 30-35%.  - LHC/RHC (6/17): 80% ostial stenosis small D1, 30-40% pLAD.  Mean RA 4, PA 26/10, mean PCWP 11, CI 2.33.  - Echo (3/18): EF 10-15%, diffuse hypokinesis, severe LV dilation, moderate RV dilation with severely decreased systolic function.  - LHC/RHC (4/18): 40% proximal LAD.  Mean RA 8, PA 37/10 mean 22, mean PCWP 22, LVEDP 9, CI 2.39. - Medtronic BiV ICD placement in 12/18 with AV nodal ablation to promote BiV pacing in 1/19.  - Echo (6/19): EF 25-30% with mild LV dilation, moderately dilated RV with mildly decreased systolic function, IVC not dilated, PASP 23 mmHg.  - CPX (7/19): peak VO2 17.6 (78% predicted), VE/VCO2 slope 31, RER 1.02 => submaximal, probably mild functional impairment.  - Echo (1/21): EF 35-40% with diffuse hypokinesis, moderate RV dilation with mildly decreased systolic function. - Echo (3/22): EF 40% with diffuse hypokinesis, mild RV enlargement, mildly decreased RV systolic function.  9. Morbid obesity 10. ILR in place.  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 Complete heart block: s/p Medtronic CRT-D.  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 OSA: Untreated, unable to tolerate CPAP.  15. COVID-19 PNA 2/21.  16. Hyperlipidemia: Myalgias with multiple statins.  17. PAD: ABIs 10/21 noncompressible on right but TBI normal, normal ABI and TBI on left.   Social History   Socioeconomic History   Marital status: Divorced    Spouse name: Not on file   Number of children: 2   Years of education: Not on file   Highest education level: Not on file  Occupational History   Occupation: retired/disabled.  prev worked in Therapist, sports.    Employer: DISABLED  Tobacco Use   Smoking status: Former    Packs/day: 2.00    Years: 30.00    Pack years: 60.00    Types: Cigarettes    Quit date: 10/16/2007    Years since quitting: 13.7   Smokeless tobacco: Never  Vaping Use   Vaping Use: Never used  Substance and Sexual Activity   Alcohol use: No   Drug use: No   Sexual activity: Not Currently  Other Topics Concern   Not on file  Social History Narrative   Lives alone.   Social Determinants of Health   Financial Resource Strain: Low Risk    Difficulty of Paying Living Expenses: Not hard at all  Food Insecurity: No Food Insecurity   Worried About Charity fundraiser in the Last Year: Never true   [place] in the Last Year: Never true  Transportation Needs: No Transportation Needs   Lack of Transportation (Medical): No   Lack of Transportation (Non-Medical): No  Physical Activity: Inactive   Days of Exercise per Week: 0 days  Minutes of Exercise per Session: 0 min  Stress: No Stress Concern Present   Feeling of Stress : Not at all  Social Connections: Moderately Integrated   Frequency of Communication with Friends and Family: More than three times a week   Frequency of Social Gatherings with Friends and Family: More than three times a week   Attends Religious Services: More than 4 times per year   Active Member of [employer] or Organizations: Yes   Attends Music therapist: More than 4 times per year   Marital Status: Never married  Human resources officer Violence: Not At Risk   Fear of Current or Ex-Partner: No   Emotionally Abused: No   Physically Abused: No   Sexually Abused: No   Family History  Problem Relation Age of Onset   COPD Mother    Asthma Mother    Colon polyps Mother    Allergies Mother    Hypothyroidism Mother    Asthma Maternal Grandmother    Colon cancer Neg Hx    ROS: All systems reviewed and negative except as per HPI.   Current Outpatient  Medications  Medication Sig Dispense Refill   acetaminophen (TYLENOL) 500 MG tablet Take 500 mg by mouth every 6 (six) hours as needed.     albuterol (VENTOLIN HFA) 108 (90 Base) MCG/ACT inhaler INHALE 2 PUFFS 4 TIMES A DAY as needed 54 g 3   ALPRAZolam (XANAX) 0.5 MG tablet TAKE 2 TABLETS BY MOUTH AT BEDTIME 60 tablet 2   AMBULATORY NON FORMULARY MEDICATION Rolling walker with a bench 1 Units 0   apixaban (ELIQUIS) 5 MG TABS tablet Take 1 tablet (5 mg total) by mouth 2 (two) times daily. 60 tablet 0   augmented betamethasone dipropionate (DIPROLENE-AF) 0.05 % cream Apply 1 application topically daily. As needed     carisoprodol (SOMA) 350 MG tablet TAKE 1 TABLET BY MOUTH THREE TIMES A DAY AS NEEDED FOR MUSCLE SPASMS 90 tablet 5   Cholecalciferol 100 MCG (4000 UT) CAPS 1 tab by mouth once daily 30 capsule 99   dapagliflozin propanediol (FARXIGA) 10 MG TABS tablet Take 1 tablet (10 mg total) by mouth daily before breakfast. 30 tablet 11   digoxin (LANOXIN) 0.25 MG tablet TAKE 1/2 TABLET BY MOUTH EVERY DAY 45 tablet 3   doxepin (SINEQUAN) 10 MG capsule TAKE 2 CAPSULES (20 MG TOTAL) BY MOUTH AT BEDTIME AS NEEDED. 180 capsule 1   Dupilumab (DUPIXENT) 300 MG/2ML SOPN Inject 1 Dose into the skin every 14 (fourteen) days.     eplerenone (INSPRA) 25 MG tablet Take 1 tablet (25 mg total) by mouth daily. 90 tablet 3   Evolocumab (REPATHA SURECLICK) [id number] MG/ML SOAJ Inject 1 pen into the skin every 14 (fourteen) days. 6 mL 3   fluticasone (FLONASE) 50 MCG/ACT nasal spray PLACE 2 SPRAYS INTO BOTH NOSTRILS DAILY AS NEEDED FOR ALLERGIES OR RHINITIS. 48 mL 1   hydrOXYzine (ATARAX/VISTARIL) 10 MG tablet TAKE 1 TABLET BY MOUTH 3 TIMES DAILY AS NEEDED FOR ITCHING. 60 tablet 2   ketoconazole (NIZORAL) 2 % cream Apply 1 application topically daily. (Patient taking differently: Apply 1 application topically daily. As needed) 30 g 1   lidocaine (LIDODERM) 5 % APPLY 1 PATCH TO AFFECTED AREA FOR UP TO 12 HOURS . DO NOT USE  MORE THAN ONE PATCH IN 24 HOURS. (Patient taking differently: As needed) 90 patch 2   losartan (COZAAR) 25 MG tablet Take 0.5 tablets (12.5 mg total) by mouth daily. [place]  tablet 3   omeprazole (PRILOSEC) 20 MG capsule TAKE 1 CAPSULE BY MOUTH TWICE A DAY (Patient taking differently: as needed.) 180 capsule 1   potassium chloride SA (KLOR-CON) 20 MEQ tablet Take 1 tablet (20 mEq total) by mouth daily. 90 tablet 3   tamsulosin (FLOMAX) 0.4 MG CAPS capsule Take 0.4 mg by mouth daily.     torsemide (DEMADEX) 20 MG tablet Take 20 mg by mouth. Patient is taking 4 tablets in the morning and 3 tablets in the evening.     traMADol (ULTRAM) 50 MG tablet TAKE 1 TABLET BY MOUTH DAILY AS NEEDED. 30 tablet 5   traZODone (DESYREL) 100 MG tablet TAKE 1 TABLET BY MOUTH EVERY DAY AT BEDTIME AS NEEDED 90 tablet 1   triamcinolone (KENALOG) 0.1 % paste Use as directed in the mouth or throat 2 (two) times daily. 5 g 2   TUMS 500 MG chewable tablet Chew 500-2,000 mg by mouth every 4 (four) hours as needed for indigestion or heartburn.      venlafaxine XR (EFFEXOR-XR) 75 MG 24 hr capsule TAKE 1 CAPSULE BY MOUTH DAILY WITH BREAKFAST. 90 capsule 1   zolpidem (AMBIEN) 10 MG tablet TAKE 1 TABLET BY MOUTH AT BEDTIME AS NEEDED 90 tablet 1   metolazone (ZAROXOLYN) 2.5 MG tablet Take 1 tablet (2.5 mg total) by mouth as directed. With additional 40 meq of potassium (Patient not taking: Reported on 06/28/2021) 5 tablet 0   No current facility-administered medications for this encounter.   Wt Readings from Last 3 Encounters:  06/28/21 110.3 kg (243 lb 3.2 oz)  06/13/21 110.2 kg (243 lb)  06/09/21 111.4 kg (245 lb 9.6 oz)   BP 128/80   Pulse 75   Wt 110.3 kg (243 lb 3.2 oz)   SpO2 96%   BMI 38.09 kg/m   General:  NAD. No resp difficulty, walked into clinic with cane HEENT: Normal Neck: Supple. No JVD. Carotids 2+ bilat; no bruits. No lymphadenopathy or thryomegaly appreciated. Cor: PMI nondisplaced. Irregular rate & rhythm.  No rubs, gallops or murmurs. Lungs: Clear Abdomen: Obese, nontender, nondistended. No hepatosplenomegaly. No bruits or masses. Good bowel sounds. Extremities: No cyanosis, clubbing, rash, edema Neuro: Alert & oriented x 3, cranial nerves grossly intact. Moves all 4 extremities w/o difficulty. Affect pleasant.  Assessment/Plan:  1. Atrial fibrillation: Permanent.  Now s/p AV nodal ablation to allow effective BiV pacing.  - Off warfarin with recent GI bleed. - Continue Eliquis. No further bleeding issues. 2. COPD: No longer smoking.  Not using oxygen.  3. Chronic systolic CHF: Nonischemic cardiomyopathy based on 6/17 cath. However, he had a recent significant fall in EF from 30-35% to 10-15% on echo from 3/18.  LHC/RHC in 4/18 showed nonobstructive CAD and relatively optimized filling pressures.  He now has Medtronic CRT-D device with AV nodal ablation to allow BiV pacing.  Echo 6/19 with EF 25-30% with mildly decreased RV systolic function. CPX 7/19 was submaximal but likely only mild functional limitation from CHF.  Echo in 1/21 showed EF 35-40%, echo in 3/22 showed EF up to 40% with mildly decreased RV systolic function.  Better NYHA class II symptoms today, functional class confounded by body habitus, knee OA and physical deconditioning. He is not overloaded on exam or OptiVol.  - Start carvedilol 3.125 mg bid. - Continue torsemide 80 mg bid.  - Continue eplerenone 25 mg daily (off spiro with gynecomastia).   - Continue losartan 12.5 mg qhs (did not tolerate low dose Entresto  with dizziness and low BPs). - Continue Farxiga 10 mg daily. - With stable NYHA II symptoms and EF 40%, I think he can stop digoxin. 4. CAD: Nonobstructive on 4/18 cath.  No exertional chest pain.  - No asa with Eliquis. - Statin myalgias, continue Repatha. Good LDL in 5/22.  - Elevated triglycerides, will give trial of dietary changes but Vascepa would be next step.  5. Complete heart block: Now has Medtronic CRT-D  device and s/p AV nodal ablation.  6. OSA: Unable to tolerate CPAP.   7. Elevated LFTs: Most recent LFTs back to normal.   8. Left knee OA: Needs TKR.  He is stable from a cardiac standpoint.  I think that he could have TKR with moderate but not unacceptable risk of complications. He plans on scheduling this soon.  - Hopefully he can come off of chronic NSAIDs.  Followup in 4 weeks with PharmD, anticipate increasing losartan or beta blocker. Follow up with Dr. [name] in 2-3 months.  Signed, Rafael Bihari, FNP  06/28/2021  [facility] [address] [facility] [place] [address] [phone] (office) [phone] (fax)

## 2021-06-27 ENCOUNTER — Ambulatory Visit: Payer: Self-pay

## 2021-06-27 NOTE — Progress Notes (Signed)
EPIC Encounter for ICM Monitoring  Patient Name: [name] is a 65 y.o. male Date: 06/27/2021 Primary Care Physican: [name], MD Primary Cardiologist: [name] Electrophysiologist: Vergie Living Pacing: 99.9%    06/07/2021 Weight: 245 lbs     Spoke with patient and heart failure questions reviewed.  Pt asymptomatic for fluid accumulation and feeling well.   Optivol thoracic impedance suggesting normal fluid levels.      Prescribed:  Torsemide 20 mg to 4 tablets (80 mg total) every morning and 3 tablets every afternoon.   Potassium 20 mEq take 1 tablet daily Spironolactone 25 mg take 1 tablet daily.   Labs: 06/09/2021 Creatinine 1.22, BUN 23, Potassium 3.7, Sodium 137, GFR >60 05/24/2021 Creatinine 0.95, BUN 14, Potassium 4.5, Sodium 135. GFR >60 05/17/2021 Creatinine 1.11, BUN 14, Potassium 4.0, Sodium 135, GFR >60 05/10/2021 Creatinine 0.77, BUN 7,   Potassium 4.0, Sodium 137, GFR >60  05/09/2021 Creatinine 0.70, BUN 7,   Potassium 3.8, Sodium 139, GFR >60  05/08/2021 Creatinine 0.74, BUN 6,   Potassium 3.5, Sodium 139, GFR >60  05/07/2021 Creatinine 0.75, BUN 8,   Potassium 3.5, Sodium 138, GFR >60  05/06/2021 Creatinine 1.15, BUN 17, Potassium 4.0, Sodium 136, GFR >6  05/05/2021 Creatinine 1.45, BUN 21, Potassium 3.9, Sodium 136, GFR 53  A complete set of results can be found in Results Review.   Recommendations:   No changes and encouraged to call if experiencing any fluid symptoms.   Follow-up plan: ICM clinic phone appointment on 07/31/2021   91 day device clinic remote transmission 07/26/2021   EP/Cardiology Office Visits:  06/28/2021 with [facility] PA/NP.   09/15/2021 with Dr [name].  Recall 02/22/2021 with Dr [name].    Copy of ICM check sent to Dr. [name].   3 month ICM trend: 06/26/2021.    1 Year ICM trend:       [name], RN 06/27/2021 8:05 AM

## 2021-06-28 ENCOUNTER — Other Ambulatory Visit: Payer: Self-pay

## 2021-06-28 ENCOUNTER — Ambulatory Visit (HOSPITAL_COMMUNITY)
Admission: RE | Admit: 2021-06-28 | Discharge: 2021-06-28 | Disposition: A | Discharge status: Home / Self Care | Payer: Medicare HMO | Source: Ambulatory Visit | Attending: Family Medicine | Admitting: Family Medicine

## 2021-06-28 ENCOUNTER — Encounter (HOSPITAL_COMMUNITY): Payer: Self-pay

## 2021-06-28 VITALS — BP 128/80 | HR 75 | Wt 243.2 lb

## 2021-06-28 DIAGNOSIS — Z9981 Dependence on supplemental oxygen: Secondary | ICD-10-CM | POA: Insufficient documentation

## 2021-06-28 DIAGNOSIS — I442 Atrioventricular block, complete: Secondary | ICD-10-CM | POA: Diagnosis not present

## 2021-06-28 DIAGNOSIS — I2510 Atherosclerotic heart disease of native coronary artery without angina pectoris: Secondary | ICD-10-CM | POA: Insufficient documentation

## 2021-06-28 DIAGNOSIS — M1712 Unilateral primary osteoarthritis, left knee: Secondary | ICD-10-CM | POA: Insufficient documentation

## 2021-06-28 DIAGNOSIS — Z7901 Long term (current) use of anticoagulants: Secondary | ICD-10-CM | POA: Diagnosis not present

## 2021-06-28 DIAGNOSIS — I428 Other cardiomyopathies: Secondary | ICD-10-CM | POA: Insufficient documentation

## 2021-06-28 DIAGNOSIS — J449 Chronic obstructive pulmonary disease, unspecified: Secondary | ICD-10-CM | POA: Diagnosis not present

## 2021-06-28 DIAGNOSIS — I4821 Permanent atrial fibrillation: Secondary | ICD-10-CM

## 2021-06-28 DIAGNOSIS — Z8616 Personal history of COVID-19: Secondary | ICD-10-CM | POA: Diagnosis not present

## 2021-06-28 DIAGNOSIS — Z79899 Other long term (current) drug therapy: Secondary | ICD-10-CM | POA: Insufficient documentation

## 2021-06-28 DIAGNOSIS — I5032 Chronic diastolic (congestive) heart failure: Secondary | ICD-10-CM | POA: Insufficient documentation

## 2021-06-28 DIAGNOSIS — R7989 Other specified abnormal findings of blood chemistry: Secondary | ICD-10-CM | POA: Insufficient documentation

## 2021-06-28 DIAGNOSIS — Z7984 Long term (current) use of oral hypoglycemic drugs: Secondary | ICD-10-CM | POA: Insufficient documentation

## 2021-06-28 DIAGNOSIS — I5022 Chronic systolic (congestive) heart failure: Secondary | ICD-10-CM

## 2021-06-28 DIAGNOSIS — Z09 Encounter for follow-up examination after completed treatment for conditions other than malignant neoplasm: Secondary | ICD-10-CM | POA: Diagnosis not present

## 2021-06-28 DIAGNOSIS — Z87891 Personal history of nicotine dependence: Secondary | ICD-10-CM | POA: Diagnosis not present

## 2021-06-28 DIAGNOSIS — I110 Hypertensive heart disease with heart failure: Secondary | ICD-10-CM | POA: Insufficient documentation

## 2021-06-28 DIAGNOSIS — G4733 Obstructive sleep apnea (adult) (pediatric): Secondary | ICD-10-CM

## 2021-06-28 LAB — BASIC METABOLIC PANEL
Anion gap: 9 (ref 5–15)
BUN: 22 mg/dL (ref 8–23)
CO2: 24 mmol/L (ref 22–32)
Calcium: 8.5 mg/dL — ABNORMAL LOW (ref 8.9–10.3)
Chloride: 104 mmol/L (ref 98–111)
Creatinine, Ser: 1.14 mg/dL (ref 0.61–1.24)
GFR, Estimated: >60 mL/min (ref >60)
Glucose, Bld: 84 mg/dL (ref 70–99)
Potassium: 3.7 mmol/L (ref 3.5–5.1)
Sodium: 137 mmol/L (ref 135–145)

## 2021-06-28 LAB — CBC
HCT: 38 % — ABNORMAL LOW (ref 39.0–52.0)
Hemoglobin: 11.7 g/dL — ABNORMAL LOW (ref 13.0–17.0)
MCH: 28.3 pg (ref 26.0–34.0)
MCHC: 30.8 g/dL (ref 30.0–36.0)
MCV: 91.8 fL (ref 80.0–100.0)
Platelets: 269 10*3/uL (ref 150–400)
RBC: 4.14 MIL/uL — ABNORMAL LOW (ref 4.22–5.81)
RDW: 16 % — ABNORMAL HIGH (ref 11.5–15.5)
WBC: 9 10*3/uL (ref 4.0–10.5)
nRBC: 0 % (ref 0.0–0.2)

## 2021-06-28 MED ORDER — CARVEDILOL 3.125 MG PO TABS: 3.1250 mg | ORAL_TABLET | Freq: Two times a day (BID) | ORAL | 11 refills | Status: DC

## 2021-06-28 NOTE — Patient Instructions (Addendum)
Labs done today. We will contact you only if your labs are abnormal.  START Carvedilol 3.'125mg'$  (1 tablet) by mouth 2 times daily.   STOP taking Digoxin.   No other medication changes were made. Please continue all current medications as prescribed.  Your physician recommends that you keep your pending appointments.  If you have any questions or concerns before your next appointment please send [place] a message through [place] or call our office at [phone].    TO LEAVE A MESSAGE FOR THE NURSE SELECT OPTION 2, PLEASE LEAVE A MESSAGE INCLUDING: YOUR NAME DATE OF BIRTH CALL BACK NUMBER REASON FOR CALL**this is important as we prioritize the call backs  YOU WILL RECEIVE A CALL BACK THE SAME DAY AS LONG AS YOU CALL BEFORE 4:00 PM   Do the following things EVERYDAY: Weigh yourself in the morning before breakfast. Write it down and keep it in a log. Take your medicines as prescribed Eat low salt foods--Limit salt (sodium) to 2000 mg per day.  Stay as active as you can everyday Limit all fluids for the day to less than 2 liters   At the [facility], you and your health needs are our priority. As part of our continuing mission to provide you with exceptional heart care, we have created designated Provider Care Teams. These Care Teams include your primary Cardiologist (physician) and Advanced Practice Providers (APPs- Physician Assistants and Nurse Practitioners) who all work together to provide you with the care you need, when you need it.   You may see any of the following providers on your designated Care Team at your next follow up: Dr Glori Bickers Dr Haynes Kerns, NP Lyda Jester, [place] Audry Riles, PharmD   Please be sure to bring in all your medications bottles to every appointment.

## 2021-06-29 NOTE — Telephone Encounter (Signed)
This medication was already refilled aug 1 at his pharmacy  No new refill needed  This is the second or third time for this request over the past month;   I am not sure why this particular medication at this particular [facility] continues to be a problem for him, or how to resolve it

## 2021-07-03 ENCOUNTER — Other Ambulatory Visit: Payer: Self-pay

## 2021-07-03 ENCOUNTER — Ambulatory Visit (INDEPENDENT_AMBULATORY_CARE_PROVIDER_SITE_OTHER): Payer: Medicare HMO | Admitting: *Deleted

## 2021-07-03 DIAGNOSIS — I255 Ischemic cardiomyopathy: Secondary | ICD-10-CM

## 2021-07-03 DIAGNOSIS — I4819 Other persistent atrial fibrillation: Secondary | ICD-10-CM

## 2021-07-03 MED ORDER — VENLAFAXINE HCL ER 75 MG PO CP24: 75.0000 mg | ORAL_CAPSULE | Freq: Every day | ORAL | 1 refills | Status: DC

## 2021-07-03 NOTE — Chronic Care Management (AMB) (Signed)
Chronic Care Management   CCM RN Visit Note  07/03/2021 Name: [name] MRN: [id number] DOB: 12/05/1955  Subjective: [name] is a 65 y.o. year old male who is a primary care patient of [name], MD. The care management team was consulted for assistance with disease management and care coordination needs.    Engaged with patient by telephone for follow up visit in response to provider referral for case management and/or care coordination services.   Consent to Services:  The patient was given information about Chronic Care Management services, agreed to services, and gave verbal consent prior to initiation of services.  Please see initial visit note for detailed documentation.  Patient agreed to services and verbal consent obtained.   Assessment: Review of patient past medical history, allergies, medications, health status, including review of consultants reports, laboratory and other test data, was performed as part of comprehensive evaluation and provision of chronic care management services.   Care Plan Allergies  Allergen Reactions   Amiodarone Other (See Comments)    hyperthyroidism   Statins Other (See Comments)    myalgia   Tape Other (See Comments)    Skin Tears Use Paper Tape Only   Outpatient Encounter Medications as of 07/03/2021  Medication Sig   acetaminophen (TYLENOL) 500 MG tablet Take 500 mg by mouth every 6 (six) hours as needed.   albuterol (VENTOLIN HFA) 108 (90 Base) MCG/ACT inhaler INHALE 2 PUFFS 4 TIMES A DAY as needed   ALPRAZolam (XANAX) 0.5 MG tablet TAKE 2 TABLETS BY MOUTH AT BEDTIME   AMBULATORY NON FORMULARY MEDICATION Rolling walker with a bench   apixaban (ELIQUIS) 5 MG TABS tablet Take 1 tablet (5 mg total) by mouth 2 (two) times daily.   augmented betamethasone dipropionate (DIPROLENE-AF) 0.05 % cream Apply 1 application topically daily. As needed   carisoprodol (SOMA) 350 MG tablet TAKE 1 TABLET BY MOUTH THREE TIMES A DAY AS NEEDED FOR  MUSCLE SPASMS   carvedilol (COREG) 3.125 MG tablet Take 1 tablet (3.125 mg total) by mouth 2 (two) times daily.   Cholecalciferol 100 MCG (4000 UT) CAPS 1 tab by mouth once daily   dapagliflozin propanediol (FARXIGA) 10 MG TABS tablet Take 1 tablet (10 mg total) by mouth daily before breakfast.   doxepin (SINEQUAN) 10 MG capsule TAKE 2 CAPSULES (20 MG TOTAL) BY MOUTH AT BEDTIME AS NEEDED.   Dupilumab (DUPIXENT) 300 MG/2ML SOPN Inject 1 Dose into the skin every 14 (fourteen) days.   eplerenone (INSPRA) 25 MG tablet Take 1 tablet (25 mg total) by mouth daily.   Evolocumab (REPATHA SURECLICK) 716 MG/ML SOAJ Inject 1 pen into the skin every 14 (fourteen) days.   fluticasone (FLONASE) 50 MCG/ACT nasal spray PLACE 2 SPRAYS INTO BOTH NOSTRILS DAILY AS NEEDED FOR ALLERGIES OR RHINITIS.   hydrOXYzine (ATARAX/VISTARIL) 10 MG tablet TAKE 1 TABLET BY MOUTH 3 TIMES DAILY AS NEEDED FOR ITCHING.   ketoconazole (NIZORAL) 2 % cream Apply 1 application topically daily. (Patient taking differently: Apply 1 application topically daily. As needed)   lidocaine (LIDODERM) 5 % APPLY 1 PATCH TO AFFECTED AREA FOR UP TO 12 HOURS . DO NOT USE MORE THAN ONE PATCH IN 24 HOURS. (Patient taking differently: As needed)   losartan (COZAAR) 25 MG tablet Take 0.5 tablets (12.5 mg total) by mouth daily.   metolazone (ZAROXOLYN) 2.5 MG tablet Take 1 tablet (2.5 mg total) by mouth as directed. With additional 40 meq of potassium (Patient not taking: Reported on  06/28/2021)   omeprazole (PRILOSEC) 20 MG capsule TAKE 1 CAPSULE BY MOUTH TWICE A DAY (Patient taking differently: as needed.)   potassium chloride SA (KLOR-CON) 20 MEQ tablet Take 1 tablet (20 mEq total) by mouth daily.   tamsulosin (FLOMAX) 0.4 MG CAPS capsule Take 0.4 mg by mouth daily.   torsemide (DEMADEX) 20 MG tablet Take 20 mg by mouth. Patient is taking 4 tablets in the morning and 3 tablets in the evening.   traMADol (ULTRAM) 50 MG tablet TAKE 1 TABLET BY MOUTH DAILY AS  NEEDED.   traZODone (DESYREL) 100 MG tablet TAKE 1 TABLET BY MOUTH EVERY DAY AT BEDTIME AS NEEDED   triamcinolone (KENALOG) 0.1 % paste Use as directed in the mouth or throat 2 (two) times daily.   TUMS 500 MG chewable tablet Chew 500-2,000 mg by mouth every 4 (four) hours as needed for indigestion or heartburn.    zolpidem (AMBIEN) 10 MG tablet TAKE 1 TABLET BY MOUTH AT BEDTIME AS NEEDED   [DISCONTINUED] venlafaxine XR (EFFEXOR-XR) 75 MG 24 hr capsule TAKE 1 CAPSULE BY MOUTH DAILY WITH BREAKFAST.   No facility-administered encounter medications on file as of 07/03/2021.   Patient Active Problem List   Diagnosis Date Noted   Bursitis of left hip 06/13/2021   Tooth fracture 06/11/2021   Pain and swelling of right lower leg 05/18/2021   Hemorrhagic shock ([place]) 05/05/2021   Gastrointestinal hemorrhage    Diverticulitis of colon    Preop exam for internal medicine 03/04/2021   B12 deficiency 03/04/2021   Vitamin D deficiency 03/01/2021   Myalgia 12/01/2020   Laceration of left leg 09/17/2020   Statin myopathy 08/02/2020   Rotator cuff arthropathy of both shoulders 02/23/2020   (HFpEF) heart failure with preserved ejection fraction ([place]) 02/22/2020   ICD (implantable cardioverter-defibrillator) in place 12/27/2019   COVID-19 virus infection 12/09/2019   COVID-19 12/09/2019   Hypotension 12/09/2019   Upper respiratory infection 11/27/2019   COPD exacerbation ([place]) 11/27/2019   Elevated PSA 09/23/2019   Lip lesion 12/23/2018   Diabetic ulcer of left lower leg with necrosis of muscle ([place]) 12/05/2018   Chronic bursitis of left shoulder 07/01/2018   Pain due to total knee replacement ([place]) 03/31/2018   Rotator cuff arthropathy, left 02/13/2018   Right rotator cuff tear arthropathy 01/14/2018   Increased prostate specific antigen (PSA) velocity 09/26/2017   Trigger point of thoracic region 07/04/2017   Right lumbar radiculopathy 05/15/2017   Right leg swelling 05/09/2017   Glaucoma  04/18/2017   History of nasal polyposis 02/19/2017   Chronic rhinitis 02/19/2017   Microhematuria 12/04/2016   Hyperlipidemia 08/15/2016   Encounter for well adult exam with abnormal findings 11/29/2015   Persistent atrial fibrillation ([place]) 03/30/2015   Syncope 03/30/2015   Acute on chronic diastolic heart failure ([place]) 03/30/2015   Former tobacco use 02/25/2015   Type 2 diabetes mellitus ([place]) 02/25/2015   Atrial fibrillation ([place]) 02/25/2015   Anemia 02/23/2015   Thyrotoxicosis 02/23/2015   Hx of adenomatous colonic polyps 10/21/2014   De Quervain's tenosynovitis, right 04/30/2014   Osteoarthritis of right knee 02/19/2014   Encounter for therapeutic drug monitoring 11/17/2013   Thoracic degenerative disc disease 06/17/2013   Thoracic spondylosis without myelopathy 02/11/2013   Lumbosacral spondylosis without myelopathy 02/11/2013   Degenerative joint disease of knee, left 02/11/2013   COPD (chronic obstructive pulmonary disease) ([place]) 08/20/2012   Bundle branch block, right and extreme left anterior fascicular 05/05/2012   Nonischemic cardiomyopathy ([place]) 05/02/2012  Other primary cardiomyopathies 05/02/2012   Anticoagulated on warfarin 03/21/2012   Spongiotic dermatitis 09/26/2011   Past myocardial infarction 04/19/2011   UNSPECIFIED URETHRAL STRICTURE 12/26/2010   Chronic pain syndrome 05/18/2010   Obstructive sleep apnea 10/21/2007   NEPHROLITHIASIS, HX OF 10/21/2007   Anxiety 06/09/2007   GERD (gastroesophageal reflux disease) 06/09/2007   Benign prostatic hyperplasia 06/09/2007   Morbid obesity ([place]) 06/03/2007   Depression 06/03/2007   Essential hypertension 06/03/2007   INSOMNIA 06/03/2007   Conditions to be addressed/monitored:Atrial Fibrillation and CHF  Care Plan : Heart Failure (Adult)  Updates made by Knox Royalty, RN since 07/03/2021 12:00 AM     Problem: Symptom Exacerbation (Heart Failure)   Priority: Medium     Long-Range Goal: Symptom  Exacerbation Prevented or Minimized   Start Date: 04/27/2021  Expected End Date: 04/27/2022  This Visit's Progress: On track  Recent Progress: On track  Priority: Medium  Note:   Current Barriers:  Ongoing need for reinforcement and support in self-health management of heart failure in setting of multiple co-morbidities, including persistent Atrial Fibrillation Fragile state of health, multiple progressing chronic health conditions Recent hospitalization July 22-27, 2022 for GI bleeding Nurse Case Manager Clinical Goal(s):  04/27/21: Over the next 12 months, patient will verbalize ongoing understanding of Heart Failure and Atrial Fibrillation Action Plan and when to call doctor, as evidenced by patient/ caregiver reporting during CCM RN CM outreach of: Completion of daily weight monitoring/ recording (notifying MD of 3 lb weight gain over night or 5 lb in a week) Adhering to low salt diet Taking prescribed medications as prescribed Interventions:  Collaboration with [name], MD regarding development and update of comprehensive plan of care as evidenced by provider attestation and co-signature Inter-disciplinary care team collaboration (see longitudinal plan of care) Chart reviewed including relevant office notes, upcoming scheduled appointments, and lab results Discussed current clinical condition with patient- he denies current clinical concerns; states "everything is about like it always is" Followed up on last acute call placed 06/05/21- patient continues to use paste as prescribed by Dr. [name]-- has not yet scheduled follow up with dentists- states "Dr. [name] said there was no infection in the tooth;" so his plan to schedule with dentist at later time, due to multiple ongoing provider appointments Confirmed no recent signs/ symptoms recurrent GI bleeding; has still not yet been cleared for TKR Confirmed that he has continued taking Eliquis BID after our conversation 05/22/21; confirms that  he stopped taking digoxin/ Entresto and started taking carvedilol 3.125 BID, post- recent cardiology provider appointment 06/28/21: reviewed post-office visit instructions with patient- he verbalizes understanding and denies questions Reviewed recent daily weights at home with patient: he reports weight today of 240.3 lbs- this is significantly improved from previously reported weights between 255-257 lbs Reviewed with patient previously provided education around signs/ symptoms CHF yellow zone, along with corresponding action plan for same: patient will benefit from ongoing support and reinforcement Confirmed patient continues following low sodium, heart healthy diet Reviewed upcoming provider appointments with patient and confirmed that patient has plans to attend all as scheduled: 07/10/21- [facility] clinic at [facility]; 07/11/21- sports medicine/ orthopedic; 10/11- podiatry; patient has resumed driving since last hospitalization Discussed plans for CCM RN CM follow up, scheduled next telephone visit with patient Self-Care Activities:  Takes Heart Failure Medications as prescribed Weighs daily and record (notifying MD of 3 lb weight gain over night or 5 lb in a week) Verbalizes understanding of and follows CHF Action  Plan Adheres to low sodium diet  Patient Goals:  Continue to weigh yourself daily and write your weights at home down on paper or on the calendar- we will review these each time we have a telephone visit.  Today you reported a weight of 240.3 lbs Call office if I gain more than 2 pounds in one day or 5 pounds in one week, especially if this is associated with chest tightness, swelling in your legs/ ankles, and new or worsened shortness of breath Take your medications as prescribed: Please take your medications to your upcoming appointment with the pharmacy clinic on July 10, 2021 Watch for swelling in feet, ankles and legs every day: contact your cardiologist if you believe your  swelling is increasing Keep legs up while sitting Continue to limit the salt in your diet: decrease your salt intake and use salt only in moderation Please read over the attached information- we will review this information periodically during our phone conversations Follow Up Plan:  Telephone follow up appointment with care management team member scheduled for:  Thursday August 10, 2021 at 1:00 pm The patient has been provided with contact information for the care management team and has been advised to call with any health related questions or concerns.      Plan: Telephone follow up appointment with care management team member scheduled for:  Thursday, October 27, 20222 at 1:00 pm The patient has been provided with contact information for the care management team and has been advised to call with any health related questions or concerns.   [name], RN, BSN, [facility] RN Care Coordination- [place] [phone]: direct office [phone]: mobile

## 2021-07-03 NOTE — Patient Instructions (Signed)
Visit Information  [name], it was nice talking with you today.   I look forward to talking to you again for an update on Thursday, October 27, 20222 at 1:00 pm- please be listening out for my call that day.  I will call as close to 1:00 pm as possible.   If you need to cancel or re-schedule our telephone visit, please call [phone] and one of our care guides will be happy to assist you.   I look forward to hearing about your progress.   Please don't hesitate to contact me if I can be of assistance to you before our next scheduled telephone appointment.   [name], RN, BSN, [facility] RN Care Coordination- [place] [phone]: direct office [phone]: mobile   PATIENT GOALS:  Goals Addressed             This Visit's Progress    Track and Manage Fluids and Swelling-Heart Failure   On track    Timeframe:  Long-Range Goal Priority:  Medium Start Date:        04/27/21                     Expected End Date:     04/27/22                  Follow Up Date 08/10/21    Continue to weigh yourself daily and write your weights at home down on paper or on the calendar- we will review these each time we have a telephone visit.  Today you reported a weight of 240.3 lbs Call office if I gain more than 2 pounds in one day or 5 pounds in one week, especially if this is associated with chest tightness, swelling in your legs/ ankles, and new or worsened shortness of breath Take your medications as prescribed: Please take your medications to your upcoming appointment with the pharmacy clinic on July 10, 2021 Watch for swelling in feet, ankles and legs every day: contact your cardiologist if you believe your swelling is increasing Keep legs up while sitting Continue to limit the salt in your diet: decrease your salt intake and use salt only in moderation Please read over the attached information- we will review this information periodically during our  phone conversations  Why is this important?   It is important to check your weight daily and watch how much salt and liquids you have.  It will help you to manage your heart failure.           Patient verbalizes understanding of instructions provided today and agrees to view in [place].  Telephone follow up appointment with care management team member scheduled for:  Thursday, October 27, 20222 at 1:00 pm The patient has been provided with contact information for the care management team and has been advised to call with any health related questions or concerns.   [name], RN, BSN, [facility] RN Care Coordination- [place] [phone]: direct office [phone]: mobile

## 2021-07-04 ENCOUNTER — Encounter (HOSPITAL_COMMUNITY): Payer: Self-pay

## 2021-07-05 ENCOUNTER — Telehealth (HOSPITAL_COMMUNITY): Payer: Self-pay | Admitting: Pharmacist

## 2021-07-05 MED ORDER — APIXABAN 5 MG PO TABS: 5.0000 mg | ORAL_TABLET | Freq: Two times a day (BID) | ORAL | 11 refills | Status: DC

## 2021-07-05 NOTE — Telephone Encounter (Signed)
Eliquis refills sent to [facility] per patient request.    Audry Riles, PharmD, BCPS, BCCP, CPP Heart Failure Clinic Pharmacist [phone]

## 2021-07-10 ENCOUNTER — Ambulatory Visit (HOSPITAL_COMMUNITY)
Admission: RE | Admit: 2021-07-10 | Discharge: 2021-07-10 | Disposition: A | Discharge status: Home / Self Care | Payer: Medicare HMO | Source: Ambulatory Visit | Attending: Cardiology | Admitting: Cardiology

## 2021-07-10 ENCOUNTER — Other Ambulatory Visit: Payer: Self-pay

## 2021-07-10 VITALS — BP 138/90 | HR 71 | Wt 248.8 lb

## 2021-07-10 DIAGNOSIS — I4819 Other persistent atrial fibrillation: Secondary | ICD-10-CM | POA: Diagnosis not present

## 2021-07-10 DIAGNOSIS — Z87891 Personal history of nicotine dependence: Secondary | ICD-10-CM | POA: Diagnosis not present

## 2021-07-10 DIAGNOSIS — I5022 Chronic systolic (congestive) heart failure: Secondary | ICD-10-CM | POA: Insufficient documentation

## 2021-07-10 DIAGNOSIS — G4733 Obstructive sleep apnea (adult) (pediatric): Secondary | ICD-10-CM | POA: Diagnosis not present

## 2021-07-10 DIAGNOSIS — R42 Dizziness and giddiness: Secondary | ICD-10-CM | POA: Insufficient documentation

## 2021-07-10 DIAGNOSIS — I428 Other cardiomyopathies: Secondary | ICD-10-CM | POA: Insufficient documentation

## 2021-07-10 DIAGNOSIS — Z7901 Long term (current) use of anticoagulants: Secondary | ICD-10-CM | POA: Insufficient documentation

## 2021-07-10 DIAGNOSIS — M25562 Pain in left knee: Secondary | ICD-10-CM | POA: Insufficient documentation

## 2021-07-10 DIAGNOSIS — R0602 Shortness of breath: Secondary | ICD-10-CM | POA: Insufficient documentation

## 2021-07-10 DIAGNOSIS — Z8679 Personal history of other diseases of the circulatory system: Secondary | ICD-10-CM | POA: Insufficient documentation

## 2021-07-10 DIAGNOSIS — Z79899 Other long term (current) drug therapy: Secondary | ICD-10-CM | POA: Diagnosis not present

## 2021-07-10 DIAGNOSIS — E781 Pure hyperglyceridemia: Secondary | ICD-10-CM | POA: Diagnosis not present

## 2021-07-10 DIAGNOSIS — M1712 Unilateral primary osteoarthritis, left knee: Secondary | ICD-10-CM | POA: Insufficient documentation

## 2021-07-10 DIAGNOSIS — R5383 Other fatigue: Secondary | ICD-10-CM | POA: Insufficient documentation

## 2021-07-10 DIAGNOSIS — I2510 Atherosclerotic heart disease of native coronary artery without angina pectoris: Secondary | ICD-10-CM | POA: Diagnosis not present

## 2021-07-10 DIAGNOSIS — R079 Chest pain, unspecified: Secondary | ICD-10-CM | POA: Diagnosis not present

## 2021-07-10 MED ORDER — CARVEDILOL 6.25 MG PO TABS: 6.2500 mg | ORAL_TABLET | Freq: Two times a day (BID) | ORAL | 11 refills | Status: DC

## 2021-07-10 NOTE — Progress Notes (Signed)
[facility] Phone: [phone] Subjective:   [name], am serving as a scribe for Dr. Hulan Saas. This visit occurred during the SARS-CoV-2 public health emergency.  Safety protocols were in place, including screening questions prior to the visit, additional usage of staff PPE, and extensive cleaning of exam room while observing appropriate contact time as indicated for disinfecting solutions.   I'm seeing this patient by the request  of:  [name], MD  CC: Multiple complaints  RFF:[id number]  06/13/2021 Patient seems to have more of a trochanteric bursitis that is likely exacerbated secondary to patient's knee arthritis.  He does need to have a knee replacement but unfortunately was unable to have it done recently.  Patient is still having his hemoglobin followed to see if that stabilizes.  Discussed icing regimen and home exercises.  Discussed avoiding certain activities.  Patient should do relatively well with this and follow-up with me again in 4 to 6 weeks. Patient given injection and tolerated the procedure well.  Discussed icing regimen and home exercises.  Patient would like to avoid any surgical intervention with this.  Patient's left shoulder though states that he has better shoulder the need to keep it working better.  Discussed icing regimen and home exercises.  Follow-up again 6 to 8 weeks. Continued pain at this time.  Discussed with patient to try to spread out injections to at least 8 to 10 weeks.  Patient has been losing weight and is under a BMI of 40.  Now awaiting the hemoglobin to stabilize.  Patient may have a replacement in the near future.  I do think that this will be beneficial.  Patient will follow up with orthopedic for that otherwise see me again in 4 to 6 weeks and can consider an injection.  Updated 07/11/2021 [name] is a 65 y.o. male coming in with complaint of left hip,  shoulder, and knee pain. Patient would like R shoulder injection and L knee. Pain is constant in these joints.  Patient has chronic osteoarthritic changes of multiple joints.  Continuing to have difficulty.  Considering still having the left knee replacement but needs to have more of a stable hemoglobin.  Still awaiting stability  L hip is feeling better after injection.  Patient was given injection in the greater trochanteric area.     Past Medical History:  Diagnosis Date   AICD (automatic cardioverter/defibrillator) present    Anemia    supposed to be taking Vit B but doesn't   ANXIETY    takes Xanax nightly   Arthritis    Asthma    Albuterol prn and Advair daily;also takes Prednisone daily   Atrial fibrillation ([place]) 09/06/2015   Cardiomyopathy ([place])    a. EF 25% TEE July 2013; b. EF normalized 2015;  c. 03/2015 Echo: EF 40-45%, difrf HK, PASP 38 mmHg, Mild MR, sev LAE/RAE.   CHF (congestive heart failure) (HCC)    Chronic constipation    takes OTC stool softener   COPD (chronic obstructive pulmonary disease) (HCC)    "one dr says COPD; one dr says emphysema" (09/18/2017)   DEPRESSION    takes Zoloft and Doxepin daily   Diverticulitis    DYSKINESIA, ESOPHAGUS    Emphysema of lung ([place])    "one dr says COPD; one dr says emphysema" (09/18/2017)   Essential hypertension        FIBROMYALGIA    GERD (gastroesophageal reflux  disease)        Glaucoma    HYPERLIPIDEMIA    a. Intolerant to statins.   INSOMNIA    takes Ambien nightly   Myocardial infarction [facility])    a. 2012 Myoview notable for prior infarct;  b. 03/2015 Lexiscan CL: EF 37%, diff HK, small area of inferior infarct from apex to base-->Med Rx.   Myocardial infarction ([place])    O2 dependent    "2.5L q hs & prn" (09/18/2017)   Paroxysmal atrial fibrillation ([place])    a. CHA2DS2VASc = 3--> takes Coumadin;  b. 03/15/2015 Successful TEE/DCCV;  c. 03/2015 recurrent afib, Amio d/c'd in setting of hyperthyroidism.   Peripheral  neuropathy    Pneumonia 12/2016   Rash and other nonspecific skin eruption 04/12/2009   no cause found saw dermatologists x 2 and allergist   SLEEP APNEA, OBSTRUCTIVE    a. doesn't use CPAP   Syncope    a. 03/2015 s/p MDT LINQ.   Type II diabetes mellitus ([place])        Past Surgical History:  Procedure Laterality Date   ACNE CYST REMOVAL     2 on back    AV NODE ABLATION N/A 10/25/2017   Procedure: AV NODE ABLATION;  Surgeon: Deboraha Sprang, MD;  Location: [facility];  Service: Cardiovascular;  Laterality: N/A;   BIV ICD INSERTION CRT-D N/A 09/18/2017   Procedure: BIV ICD INSERTION CRT-D;  Surgeon: Deboraha Sprang, MD;  Location: [place];  Service: Cardiovascular;  Laterality: N/A;   CARDIAC CATHETERIZATION N/A 03/21/2016   Procedure: Right/Left Heart Cath and Coronary Angiography;  Surgeon: Larey Dresser, MD;  Location: [facility];  Service: Cardiovascular;  Laterality: N/A;   CARDIOVERSION  04/18/2012   Procedure: CARDIOVERSION;  Surgeon: Fay Records, MD;  Location: [facility] [place];  Service: Cardiovascular;  Laterality: N/A;   CARDIOVERSION  04/25/2012   Procedure: CARDIOVERSION;  Surgeon: Thayer Headings, MD;  Location: [facility];  Service: Cardiovascular;  Laterality: N/A;   CARDIOVERSION  04/25/2012   Procedure: CARDIOVERSION;  Surgeon: Fay Records, MD;  Location: [place];  Service: Cardiovascular;  Laterality: N/A;   CARDIOVERSION  05/09/2012   Procedure: CARDIOVERSION;  Surgeon: Sherren Mocha, MD;  Location: [place];  Service: Cardiovascular;  Laterality: N/A;  changed from crenshaw to cooper by trish/leone-endo   CARDIOVERSION N/A 03/15/2015   Procedure: CARDIOVERSION;  Surgeon: Thayer Headings, MD;  Location: ;  Service: Cardiovascular;  Laterality: N/A;   COLONOSCOPY     COLONOSCOPY WITH PROPOFOL N/A 10/21/2014   Procedure: COLONOSCOPY WITH PROPOFOL;  Surgeon: Ladene Artist, MD;  Location: [facility];  Service: Endoscopy;  Laterality: N/A;   EP  IMPLANTABLE DEVICE N/A 04/06/2015   Procedure: Loop Recorder Insertion;  Surgeon: Evans Lance, MD;  Location: [facility];  Service: Cardiovascular;  Laterality: N/A;   ESOPHAGOGASTRODUODENOSCOPY     JOINT REPLACEMENT     LOOP RECORDER REMOVAL N/A 09/18/2017   Procedure: LOOP RECORDER REMOVAL;  Surgeon: Deboraha Sprang, MD;  Location: [facility];  Service: Cardiovascular;  Laterality: N/A;   RIGHT/LEFT HEART CATH AND CORONARY ANGIOGRAPHY N/A 01/28/2017   Procedure: Right/Left Heart Cath and Coronary Angiography;  Surgeon: Larey Dresser, MD;  Location: [facility];  Service: Cardiovascular;  Laterality: N/A;   TEE WITHOUT CARDIOVERSION  04/25/2012   Procedure: TRANSESOPHAGEAL ECHOCARDIOGRAM (TEE);  Surgeon: Thayer Headings, MD;  Location: [place];  Service: Cardiovascular;  Laterality: N/A;   TEE WITHOUT CARDIOVERSION N/A 03/15/2015   Procedure: TRANSESOPHAGEAL ECHOCARDIOGRAM (TEE);  Surgeon: Thayer Headings, MD;  Location: [place];  Service: Cardiovascular;  Laterality: N/A;   TONSILLECTOMY AND ADENOIDECTOMY     TOTAL KNEE ARTHROPLASTY Right 06/15/2014   Procedure: TOTAL KNEE ARTHROPLASTY;  Surgeon: Renette Butters, MD;  Location: [place];  Service: Orthopedics;  Laterality: Right;   Social History   Socioeconomic History   Marital status: Divorced    Spouse name: Not on file   Number of children: 2   Years of education: Not on file   Highest education level: Not on file  Occupational History   Occupation: retired/disabled. prev worked in Therapist, sports.    Employer: DISABLED  Tobacco Use   Smoking status: Former    Packs/day: 2.00    Years: 30.00    Pack years: 60.00    Types: Cigarettes    Quit date: 10/16/2007    Years since quitting: 13.7   Smokeless tobacco: Never  Vaping Use   Vaping Use: Never used  Substance and Sexual Activity   Alcohol use: No   Drug use: No   Sexual activity: Not Currently  Other Topics Concern   Not on file  Social  History Narrative   Lives alone.   Social Determinants of Health   Financial Resource Strain: Low Risk    Difficulty of Paying Living Expenses: Not hard at all  Food Insecurity: No Food Insecurity   Worried About Charity fundraiser in the Last Year: Never true   [place] in the Last Year: Never true  Transportation Needs: No Transportation Needs   Lack of Transportation (Medical): No   Lack of Transportation (Non-Medical): No  Physical Activity: Inactive   Days of Exercise per Week: 0 days   Minutes of Exercise per Session: 0 min  Stress: No Stress Concern Present   Feeling of Stress : Not at all  Social Connections: Moderately Integrated   Frequency of Communication with Friends and Family: More than three times a week   Frequency of Social Gatherings with Friends and Family: More than three times a week   Attends Religious Services: More than 4 times per year   Active Member of [employer] or Organizations: Yes   Attends Music therapist: More than 4 times per year   Marital Status: Never married   Allergies  Allergen Reactions   Amiodarone Other (See Comments)    hyperthyroidism   Statins Other (See Comments)    myalgia   Tape Other (See Comments)    Skin Tears Use Paper Tape Only   Family History  Problem Relation Age of Onset   COPD Mother    Asthma Mother    Colon polyps Mother    Allergies Mother    Hypothyroidism Mother    Asthma Maternal Grandmother    Colon cancer Neg Hx     Current Outpatient Medications (Endocrine & Metabolic):    dapagliflozin propanediol (FARXIGA) 10 MG TABS tablet, Take 1 tablet (10 mg total) by mouth daily before breakfast.  Current Outpatient Medications (Cardiovascular):    carvedilol (COREG) 6.25 MG tablet, Take 1 tablet (6.25 mg total) by mouth 2 (two) times daily.   eplerenone (INSPRA) 25 MG tablet, Take 1 tablet (25 mg total) by mouth daily.   Evolocumab (REPATHA SURECLICK) 536 MG/ML SOAJ, Inject 1 pen into the  skin every 14 (fourteen) days.   losartan (COZAAR) 25 MG tablet, Take 0.5  tablets (12.5 mg total) by mouth daily.   metolazone (ZAROXOLYN) 2.5 MG tablet, Take 1 tablet (2.5 mg total) by mouth as directed. With additional 40 meq of potassium   torsemide (DEMADEX) 20 MG tablet, Take 20 mg by mouth. Patient is taking 4 tablets in the morning and 3 tablets in the evening.  Current Outpatient Medications (Respiratory):    albuterol (VENTOLIN HFA) 108 (90 Base) MCG/ACT inhaler, INHALE 2 PUFFS 4 TIMES A DAY as needed   fluticasone (FLONASE) 50 MCG/ACT nasal spray, PLACE 2 SPRAYS INTO BOTH NOSTRILS DAILY AS NEEDED FOR ALLERGIES OR RHINITIS.  Current Outpatient Medications (Analgesics):    acetaminophen (TYLENOL) 500 MG tablet, Take 500 mg by mouth every 6 (six) hours as needed.   traMADol (ULTRAM) 50 MG tablet, TAKE 1 TABLET BY MOUTH DAILY AS NEEDED.  Current Outpatient Medications (Hematological):    apixaban (ELIQUIS) 5 MG TABS tablet, Take 1 tablet (5 mg total) by mouth 2 (two) times daily.  Current Outpatient Medications (Other):    ALPRAZolam (XANAX) 0.5 MG tablet, TAKE 2 TABLETS BY MOUTH AT BEDTIME   AMBULATORY NON FORMULARY MEDICATION, Rolling walker with a bench   augmented betamethasone dipropionate (DIPROLENE-AF) 0.05 % cream, Apply 1 application topically daily. As needed   carisoprodol (SOMA) 350 MG tablet, TAKE 1 TABLET BY MOUTH THREE TIMES A DAY AS NEEDED FOR MUSCLE SPASMS   Cholecalciferol 100 MCG (4000 UT) CAPS, 1 tab by mouth once daily   doxepin (SINEQUAN) 10 MG capsule, TAKE 2 CAPSULES (20 MG TOTAL) BY MOUTH AT BEDTIME AS NEEDED.   Dupilumab (DUPIXENT) 300 MG/2ML SOPN, Inject 1 Dose into the skin every 14 (fourteen) days.   hydrOXYzine (ATARAX/VISTARIL) 10 MG tablet, TAKE 1 TABLET BY MOUTH 3 TIMES DAILY AS NEEDED FOR ITCHING.   ketoconazole (NIZORAL) 2 % cream, Apply 1 application topically daily. (Patient taking differently: Apply 1 application topically daily. As needed)    lidocaine (LIDODERM) 5 %, APPLY 1 PATCH TO AFFECTED AREA FOR UP TO 12 HOURS . DO NOT USE MORE THAN ONE PATCH IN 24 HOURS. (Patient taking differently: As needed)   omeprazole (PRILOSEC) 20 MG capsule, TAKE 1 CAPSULE BY MOUTH TWICE A DAY (Patient taking differently: as needed.)   potassium chloride SA (KLOR-CON) 20 MEQ tablet, Take 1 tablet (20 mEq total) by mouth daily.   tamsulosin (FLOMAX) 0.4 MG CAPS capsule, Take 0.4 mg by mouth daily.   traZODone (DESYREL) 100 MG tablet, TAKE 1 TABLET BY MOUTH EVERY DAY AT BEDTIME AS NEEDED   traZODone (DESYREL) 50 MG tablet, TAKE 2 TABLETS (100 MG TOTAL) BY MOUTH AT BEDTIME AS NEEDED FOR SLEEP.   triamcinolone (KENALOG) 0.1 % paste, Use as directed in the mouth or throat 2 (two) times daily.   TUMS 500 MG chewable tablet, Chew 500-2,000 mg by mouth every 4 (four) hours as needed for indigestion or heartburn.    venlafaxine XR (EFFEXOR-XR) 75 MG 24 hr capsule, Take 1 capsule (75 mg total) by mouth daily with breakfast.   zolpidem (AMBIEN) 10 MG tablet, TAKE 1 TABLET BY MOUTH AT BEDTIME AS NEEDED   Reviewed prior external information including notes and imaging from  primary care provider As well as notes that were available from care everywhere and other healthcare systems.  Past medical history, social, surgical and family history all reviewed in electronic medical record.  No pertanent information unless stated regarding to the chief complaint.   Review of Systems:  No headache, visual changes, nausea, vomiting, diarrhea, constipation, dizziness,  abdominal pain, skin rash, fevers, chills, night sweats, weight loss, swollen lymph nodes,  chest pain, shortness of breath, mood changes. POSITIVE muscle aches, body aches, joint swelling  Objective  Blood pressure 104/76, pulse 81, height 5\' 7"  (1.702 m), SpO2 98 %.   General: No apparent distress alert and oriented x3 mood and affect normal, dressed appropriately.  HEENT: Pupils equal, extraocular  movements intact  Respiratory: Patient's speak in full sentences and does not appear short of breath  Cardiovascular: No lower extremity edema, non tender, no erythema  Gait antalgic using the aid of 2 canes. Left knee does have instability noted and severe arthritic changes.  Lacking the last 5 degrees of extension and flexion. Right shoulder exam does have some atrophy noted.  Patient does have limited range of motion in all planes.  3 out of 5 strength noted.  Procedure: Real-time Ultrasound Guided Injection of right glenohumeral joint Device: GE Logiq Q7  Ultrasound guided injection is preferred based studies that show increased duration, increased effect, greater accuracy, decreased procedural pain, increased response rate with ultrasound guided versus blind injection.  Verbal informed consent obtained.  Time-out conducted.  Noted no overlying erythema, induration, or other signs of local infection.  Skin prepped in a sterile fashion.  Local anesthesia: Topical Ethyl chloride.  With sterile technique and under real time ultrasound guidance:  Joint visualized.  23g 1  inch needle inserted posterior approach. Pictures taken for needle placement. Patient did have injection of 2 cc of 1% lidocaine, 2 cc of 0.5% Marcaine, and 1.0 cc of Kenalog 40 mg/dL. Completed without difficulty  Pain immediately resolved suggesting accurate placement of the medication.  Advised to call if fevers/chills, erythema, induration, drainage, or persistent bleeding.  Impression: Technically successful ultrasound guided injection.  After informed written and verbal consent, patient was seated on exam table. Left knee was prepped with alcohol swab and utilizing anterolateral approach, patient's left knee space was injected with 4:1  marcaine 0.5%: Kenalog 40mg /dL. Patient tolerated the procedure well without immediate complications.   Impression and Recommendations:     The above documentation has been reviewed  and is accurate and complete Lyndal Pulley, DO

## 2021-07-10 NOTE — Patient Instructions (Signed)
It was a pleasure seeing you today!  MEDICATIONS: -We are changing your medications today -Increase carvedilol to 6.25 mg (1 tablet) twice daily. You may take 2 tablets twice daily of the 3.125 mg tablet until you pick up the new strength.  -Call if you have questions about your medications.  NEXT APPOINTMENT: Return to clinic in 2 months with Dr. [name].  In general, to take care of your heart failure: -Limit your fluid intake to 2 Liters (half-gallon) per day.   -Limit your salt intake to ideally 2-3 grams (2000-3000 mg) per day. -Weigh yourself daily and record, and bring that "weight diary" to your next appointment.  (Weight gain of 2-3 pounds in 1 day typically means fluid weight.) -The medications for your heart are to help your heart and help you live longer.   -Please contact us before stopping any of your heart medications.  Call the clinic at [phone] with questions or to reschedule future appointments.

## 2021-07-10 NOTE — Progress Notes (Signed)
PCP:  [name], MD      Cardiologist:  Dr. [name]  HPI:  [name] is a 65 y.o. male who has history of COPD, permanent atrial fibrillation, and chronic systolic CHF. Amiodarone and DCCV were tried for atrial fibrillation in the past without success.    Patient has been noted to have a low EF since 2013. EF improved back to normal by 2015 but dropped to 30-35% by 08/2015. Cath in 03/2016 showed some CAD but not enough to cause cardiomyopathy. He was admitted in 12/2016 with PNA, atrial fibrillation/RVR, and acute on chronic systolic CHF. Echo in 12/2016 showed fall in EF to 10-15%.    R/LHC in 01/2017, which showed nonobstructive CAD and relatively optimized filling pressures.     Syncope in 08/2017. Loop recorder showed 6 second pause and periods of complete heart block. He had Medtronic BiV ICD placed in 09/2017.  In 10/2017, he had AV nodal ablation to promote BiV pacing.    Echo was done 03/2018 with EF 25-30% with moderate RV dilation/mildly decreased systolic function. CPX 04/2018 was submaximal, probably mild functional impairment.     Echo in 10/2019 showed EF 35-40% with diffuse hypokinesis, moderate RV dilation with mildly decreased systolic function.    He was admitted with COVID-19 PNA in 11/2019. He had AKI/hypotension and Entresto was stopped.    Echo in 12/2020 showed EF 40% with diffuse hypokinesis, mild RV enlargement, mildly decreased RV systolic function.    [facility]ed [facility] with weakness and dizziness. Found to be in hemorrhagic shock from acute GIB. Hgb 5.0, INR 8.4. He was on Coumadin for atrial fibrillation. He was resuscitated with PRBCs, IVF, and given Vitamin K. His Entresto, beta blocker and torsemide were held due to hypotension. He initially required ICU monitoring due to hypotension requiring pressors. GI team was consulted and planned to treat with oral antibiotics for suspected diverticular bleed with plans for outpatient colonoscopy.  Coumadin was  transitioned to Eliquis. He was discharged back on beta blocker, Entresto and SGLT2i. Digoxin, spironolactone and torsemide were held at discharge. Weight 259 lbs.   He returned to HF Clinic on 06/28/21 for HF follow up. Was feeling better since stopping Entresto, no further dizziness or low BP at home. Remained limited physically by knee pain, some SOB with increased physical activity. Denied CP, edema, or PND/Orthopnea. Appetite was ok. No fever or chills. Taking all medications. Struggled with digoxin pills getting stuck in his throat causing discomfort. He continued to take ibuprofen daily for knee OA. Denied bleeding.  Today he returns to HF clinic for pharmacist medication titration. At last visit with APP, carvedilol 3.125 mg BID was started and digoxin was stopped given stable NYHA II symptoms and EF 40%. Overall, feeling good. BP in clinic 138/90 but did not take morning medications yet. Home BPs ~115-120/80-90. Gets slightly dizzy when he first stands up, but only happens occassionally. Gets fatigued d/t limitations like his knee pain. Reports occasional chest pain that occurs when he takes his medications. It has greatly improved since stopping digoxin. Gets SOB with increased physical activity which is his normal. Weight at home stable around 243 lbs. Takes torsemide 80 mg every morning and 60 mg every evening and has not needed any extra. Denies PND and orthopnea. Regularly sleeps in a recliner for comfort. Patient is taking and tolerating all his medications.  HF Medications: Carvedilol 3.125 mg BID Losartan 12.5 mg daily Eplerenone 25 mg daily Farxiga 10 mg daily Torsemide  80 mg in the morning and 60 mg in the evening  Has the patient been experiencing any side effects to the medications prescribed? No, patient is tolerating all his medications.  Does the patient have any problems obtaining medications due to transportation or finances? Patient has [employer].  Understanding of regimen: good Understanding of indications: good Potential of compliance: good Patient understands to avoid NSAIDs. Patient understands to avoid decongestants.   Pertinent Lab Values: 06/28/21: Serum creatinine 1.14, BUN 22, Potassium 3.7, Sodium 137  Vital Signs: Weight: 248.8 lbs (last clinic weight: 243.2 lbs) Blood pressure: 138/90  Heart rate: 71   Assessment/Plan: 1. Atrial fibrillation: Permanent. Now s/p AV nodal ablation to allow effective BiV pacing.  - Off warfarin with recent GI bleed. - Continue Eliquis. No further bleeding issues.  2. COPD: No longer smoking. Not using oxygen.   3. Chronic systolic CHF: Nonischemic cardiomyopathy based on 03/2016 cath. However, he had a recent significant fall in EF from 30-35% to 10-15% on echo from 12/2016. LHC/RHC in 01/2017 showed nonobstructive CAD and relatively optimized filling pressures. He now has Medtronic CRT-D device with AV nodal ablation to allow BiV pacing. Echo 03/2018 with EF 25-30% with mildly decreased RV systolic function. CPX 04/2018 was submaximal but likely only mild functional limitation from CHF.  Echo in 10/2019 showed EF 35-40%, echo in 12/2020 showed EF up to 40% with mildly decreased RV systolic function.  - NYHA class II symptoms today, functional class confounded by body habitus, knee OA and physical deconditioning. He is not volume overloaded on exam. - Continue torsemide 80 mg in the morning and 60 mg in the evening. - Increase carvedilol to 6.25 mg BID - Continue losartan 12.5 mg daily (did not tolerate low dose Entresto recently with dizziness and low Bps but has tolerated in the past). - Continue eplerenone 25 mg daily (off spironolactone with gynecomastia).   - Continue Farxiga 10 mg daily. - With stable NYHA II symptoms and EF 40%, previously stopped digoxin.  4. CAD: Nonobstructive on 01/2017 cath.  No exertional chest pain.  - No ASA with Eliquis. - Statin myalgias, continue  Repatha. Good LDL in 02/2021.  - Elevated triglycerides, will give trial of dietary changes but Vascepa would be next step.   5. Complete heart block: Now has Medtronic CRT-D device and s/p AV nodal ablation.   6. OSA: Unable to tolerate CPAP.    7. Elevated LFTs: Most recent LFTs back to normal.    8. Left knee OA: Needs TKR. He is stable from a cardiac standpoint. Could have TKR with moderate but not unacceptable risk of complications. He plans on scheduling this soon.  - Hopefully he can come off of chronic NSAIDs.  Follow-up on 09/15/21 with Dr. Rush Farmer, PharmD, BCPS, [facility], [facility] Pharmacist [phone]

## 2021-07-11 ENCOUNTER — Encounter: Payer: Self-pay | Admitting: Internal Medicine

## 2021-07-11 ENCOUNTER — Ambulatory Visit: Payer: Medicare HMO | Admitting: Family Medicine

## 2021-07-11 ENCOUNTER — Encounter: Payer: Self-pay | Admitting: Family Medicine

## 2021-07-11 ENCOUNTER — Ambulatory Visit: Payer: Self-pay

## 2021-07-11 ENCOUNTER — Other Ambulatory Visit: Payer: Self-pay

## 2021-07-11 ENCOUNTER — Other Ambulatory Visit: Payer: Self-pay | Admitting: Internal Medicine

## 2021-07-11 VITALS — BP 104/76 | HR 81 | Ht 67.0 in

## 2021-07-11 DIAGNOSIS — G8929 Other chronic pain: Secondary | ICD-10-CM | POA: Diagnosis not present

## 2021-07-11 DIAGNOSIS — M1712 Unilateral primary osteoarthritis, left knee: Secondary | ICD-10-CM | POA: Diagnosis not present

## 2021-07-11 DIAGNOSIS — M25512 Pain in left shoulder: Secondary | ICD-10-CM | POA: Diagnosis not present

## 2021-07-11 DIAGNOSIS — M75101 Unspecified rotator cuff tear or rupture of right shoulder, not specified as traumatic: Secondary | ICD-10-CM

## 2021-07-11 DIAGNOSIS — M12811 Other specific arthropathies, not elsewhere classified, right shoulder: Secondary | ICD-10-CM | POA: Diagnosis not present

## 2021-07-11 NOTE — Assessment & Plan Note (Signed)
Chronic problem with exacerbation.  Patient is considered the possibility of surgical intervention but is still waiting for stability of his hemoglobin.  Patient is also on a blood thinner which makes it somewhat difficult.  Patient given another injection to buy him more time and likely will not be having it for only quite a while.  Follow-up with me again in 3 months or sooner if worsening pain.

## 2021-07-11 NOTE — Assessment & Plan Note (Signed)
Patient does have significant arthritic changes noted.  Continuing to respond fairly well to injections.  Would like to try to space them out when possible.  Follow-up with me again 6 to 8 weeks.  I have discussed with patient multiple times about lifting anything frequently but at the moment patient weeks she is does not have any other treatment options available.

## 2021-07-11 NOTE — Patient Instructions (Signed)
Injected R shoulder and L knee today See me again in 6-8 weeks

## 2021-07-12 MED ORDER — TRAZODONE HCL 100 MG PO TABS: ORAL_TABLET | ORAL | 1 refills | Status: DC

## 2021-07-14 DIAGNOSIS — I4819 Other persistent atrial fibrillation: Secondary | ICD-10-CM

## 2021-07-14 DIAGNOSIS — I255 Ischemic cardiomyopathy: Secondary | ICD-10-CM | POA: Diagnosis not present

## 2021-07-15 ENCOUNTER — Other Ambulatory Visit: Payer: Self-pay | Admitting: Internal Medicine

## 2021-07-15 DIAGNOSIS — I255 Ischemic cardiomyopathy: Secondary | ICD-10-CM

## 2021-07-15 DIAGNOSIS — I4819 Other persistent atrial fibrillation: Secondary | ICD-10-CM

## 2021-07-15 DIAGNOSIS — I428 Other cardiomyopathies: Secondary | ICD-10-CM

## 2021-07-19 ENCOUNTER — Encounter: Payer: Self-pay | Admitting: Internal Medicine

## 2021-07-21 ENCOUNTER — Other Ambulatory Visit: Payer: Self-pay

## 2021-07-21 ENCOUNTER — Encounter: Payer: Self-pay | Admitting: Internal Medicine

## 2021-07-21 ENCOUNTER — Ambulatory Visit (INDEPENDENT_AMBULATORY_CARE_PROVIDER_SITE_OTHER): Payer: Medicare HMO | Admitting: Internal Medicine

## 2021-07-21 VITALS — BP 122/76 | HR 82 | Temp 97.8°F | Ht 67.0 in | Wt 243.0 lb

## 2021-07-21 DIAGNOSIS — J441 Chronic obstructive pulmonary disease with (acute) exacerbation: Secondary | ICD-10-CM

## 2021-07-21 DIAGNOSIS — E1165 Type 2 diabetes mellitus with hyperglycemia: Secondary | ICD-10-CM

## 2021-07-21 DIAGNOSIS — R051 Acute cough: Secondary | ICD-10-CM

## 2021-07-21 DIAGNOSIS — E559 Vitamin D deficiency, unspecified: Secondary | ICD-10-CM | POA: Diagnosis not present

## 2021-07-21 MED ORDER — LEVOFLOXACIN 500 MG PO TABS: 500.0000 mg | ORAL_TABLET | Freq: Every day | ORAL | 0 refills | Status: AC

## 2021-07-21 MED ORDER — HYDROCODONE BIT-HOMATROP MBR 5-1.5 MG/5ML PO SOLN: 5.0000 mL | Freq: Four times a day (QID) | ORAL | 0 refills | Status: AC | PRN

## 2021-07-21 MED ORDER — PREDNISONE 10 MG PO TABS: ORAL_TABLET | ORAL | 0 refills | Status: DC

## 2021-07-21 NOTE — Research (Signed)
Late entry   Pt ineligible due to NT Pro BNP

## 2021-07-21 NOTE — Patient Instructions (Addendum)
Please take all new medication as prescribed - the antibiotic and cough medicine, and prednisone  Please continue all other medications as before, and refills have been done if requested.  Please have the pharmacy call with any other refills you may need.  Please keep your appointments with your specialists as you may have planned  Please go to the XRAY Department at the [facility] will be contacted by phone if any changes need to be made immediately.  Otherwise, you will receive a letter about your results with an explanation, but please check with MyChart first.  Please remember to sign up for MyChart if you have not done so, as this will be important to you in the future with finding out test results, communicating by private email, and scheduling acute appointments online when needed.

## 2021-07-22 ENCOUNTER — Encounter: Payer: Self-pay | Admitting: Internal Medicine

## 2021-07-22 NOTE — Progress Notes (Addendum)
Patient ID: [name], male   DOB: [birth year], 65 y.o.   MRN: [id number]        Chief Complaint: follow up productive cough, sob and wheeziness       HPI:  [name] is a 65 y.o. male here with acute onset mild to mod 2-3 days ST, HA, general weakness and malaise, with prod cough greenish sputum, but Pt denies chest pain, increased sob or doe, wheezing, orthopnea, PND, increased LE swelling, palpitations, dizziness or syncope, except for 2 days mild worsening sob and wheeziness and doe.  Has had this several times in the past, hoping for antibx and cough med. Once became worse and required ICU.   Pt denies polydipsia, polyuria, or new focal neuro s/s.   Pt denies fever, wt loss, night sweats, loss of appetite, or other constitutional symptoms         Wt Readings from Last 3 Encounters:  07/21/21 243 lb (110.2 kg)  07/10/21 248 lb 12.8 oz (112.9 kg)  06/28/21 243 lb 3.2 oz (110.3 kg)   BP Readings from Last 3 Encounters:  07/21/21 122/76  07/11/21 104/76  07/10/21 138/90         Past Medical History:  Diagnosis Date   AICD (automatic cardioverter/defibrillator) present    Anemia    supposed to be taking Vit B but doesn't   ANXIETY    takes Xanax nightly   Arthritis    Asthma    Albuterol prn and Advair daily;also takes Prednisone daily   Atrial fibrillation ([place]) 09/06/2015   Cardiomyopathy ([place])    a. EF 25% TEE July 2013; b. EF normalized 2015;  c. 03/2015 Echo: EF 40-45%, difrf HK, PASP 38 mmHg, Mild MR, sev LAE/RAE.   CHF (congestive heart failure) (HCC)    Chronic constipation    takes OTC stool softener   COPD (chronic obstructive pulmonary disease) (HCC)    "one dr says COPD; one dr says emphysema" (09/18/2017)   DEPRESSION    takes Zoloft and Doxepin daily   Diverticulitis    DYSKINESIA, ESOPHAGUS    Emphysema of lung ([place])    "one dr says COPD; one dr says emphysema" (09/18/2017)   Essential hypertension        FIBROMYALGIA    GERD (gastroesophageal reflux  disease)        Glaucoma    HYPERLIPIDEMIA    a. Intolerant to statins.   INSOMNIA    takes Ambien nightly   Myocardial infarction [facility])    a. 2012 Myoview notable for prior infarct;  b. 03/2015 Lexiscan CL: EF 37%, diff HK, small area of inferior infarct from apex to base-->Med Rx.   Myocardial infarction ([place])    O2 dependent    "2.5L q hs & prn" (09/18/2017)   Paroxysmal atrial fibrillation ([place])    a. CHA2DS2VASc = 3--> takes Coumadin;  b. 03/15/2015 Successful TEE/DCCV;  c. 03/2015 recurrent afib, Amio d/c'd in setting of hyperthyroidism.   Peripheral neuropathy    Pneumonia 12/2016   Rash and other nonspecific skin eruption 04/12/2009   no cause found saw dermatologists x 2 and allergist   SLEEP APNEA, OBSTRUCTIVE    a. doesn't use CPAP   Syncope    a. 03/2015 s/p MDT LINQ.   Type II diabetes mellitus ([place])        Past Surgical History:  Procedure Laterality Date   ACNE CYST REMOVAL     2 on back    AV NODE ABLATION N/A  10/25/2017   Procedure: AV NODE ABLATION;  Surgeon: Deboraha Sprang, MD;  Location: [place] [facility];  Service: Cardiovascular;  Laterality: N/A;   BIV ICD INSERTION CRT-D N/A 09/18/2017   Procedure: BIV ICD INSERTION CRT-D;  Surgeon: Deboraha Sprang, MD;  Location: [place] [facility];  Service: Cardiovascular;  Laterality: N/A;   CARDIAC CATHETERIZATION N/A 03/21/2016   Procedure: Right/Left Heart Cath and Coronary Angiography;  Surgeon: Larey Dresser, MD;  Location: [place] [facility];  Service: Cardiovascular;  Laterality: N/A;   CARDIOVERSION  04/18/2012   Procedure: CARDIOVERSION;  Surgeon: Fay Records, MD;  Location: [place];  Service: Cardiovascular;  Laterality: N/A;   CARDIOVERSION  04/25/2012   Procedure: CARDIOVERSION;  Surgeon: Thayer Headings, MD;  Location: [place];  Service: Cardiovascular;  Laterality: N/A;   CARDIOVERSION  04/25/2012   Procedure: CARDIOVERSION;  Surgeon: Fay Records, MD;  Location: [place];  Service: Cardiovascular;  Laterality:  N/A;   CARDIOVERSION  05/09/2012   Procedure: CARDIOVERSION;  Surgeon: Sherren Mocha, MD;  Location: [place];  Service: Cardiovascular;  Laterality: N/A;  changed from crenshaw to cooper by trish/leone-endo   CARDIOVERSION N/A 03/15/2015   Procedure: CARDIOVERSION;  Surgeon: Thayer Headings, MD;  Location: [place];  Service: Cardiovascular;  Laterality: N/A;   COLONOSCOPY     COLONOSCOPY WITH PROPOFOL N/A 10/21/2014   Procedure: COLONOSCOPY WITH PROPOFOL;  Surgeon: Ladene Artist, MD;  Location: [facility];  Service: Endoscopy;  Laterality: N/A;   EP IMPLANTABLE DEVICE N/A 04/06/2015   Procedure: Loop Recorder Insertion;  Surgeon: Evans Lance, MD;  Location: [place] [facility];  Service: Cardiovascular;  Laterality: N/A;   ESOPHAGOGASTRODUODENOSCOPY     JOINT REPLACEMENT     LOOP RECORDER REMOVAL N/A 09/18/2017   Procedure: LOOP RECORDER REMOVAL;  Surgeon: Deboraha Sprang, MD;  Location: [place] [facility];  Service: Cardiovascular;  Laterality: N/A;   RIGHT/LEFT HEART CATH AND CORONARY ANGIOGRAPHY N/A 01/28/2017   Procedure: Right/Left Heart Cath and Coronary Angiography;  Surgeon: Larey Dresser, MD;  Location: [place] [facility];  Service: Cardiovascular;  Laterality: N/A;   TEE WITHOUT CARDIOVERSION  04/25/2012   Procedure: TRANSESOPHAGEAL ECHOCARDIOGRAM (TEE);  Surgeon: Thayer Headings, MD;  Location: [place];  Service: Cardiovascular;  Laterality: N/A;   TEE WITHOUT CARDIOVERSION N/A 03/15/2015   Procedure: TRANSESOPHAGEAL ECHOCARDIOGRAM (TEE);  Surgeon: Thayer Headings, MD;  Location: [place];  Service: Cardiovascular;  Laterality: N/A;   TONSILLECTOMY AND ADENOIDECTOMY     TOTAL KNEE ARTHROPLASTY Right 06/15/2014   Procedure: TOTAL KNEE ARTHROPLASTY;  Surgeon: Renette Butters, MD;  Location: [place];  Service: Orthopedics;  Laterality: Right;    reports that he quit smoking about 13 years ago. His smoking use included cigarettes. He has a 60.00 pack-year smoking history. He  has never used smokeless tobacco. He reports that he does not drink alcohol and does not use drugs. family history includes Allergies in his mother; Asthma in his maternal grandmother and mother; COPD in his mother; Colon polyps in his mother; Hypothyroidism in his mother. Allergies  Allergen Reactions   Amiodarone Other (See Comments)    hyperthyroidism   Statins Other (See Comments)    myalgia   Tape Other (See Comments)    Skin Tears Use Paper Tape Only   Current Outpatient Medications on File Prior to Visit  Medication Sig Dispense Refill   acetaminophen (TYLENOL) 500 MG tablet Take 500 mg by mouth every  6 (six) hours as needed.     albuterol (VENTOLIN HFA) 108 (90 Base) MCG/ACT inhaler INHALE 2 PUFFS 4 TIMES A DAY as needed 54 g 3   ALPRAZolam (XANAX) 0.5 MG tablet TAKE 2 TABLETS BY MOUTH AT BEDTIME 60 tablet 2   AMBULATORY NON FORMULARY MEDICATION Rolling walker with a bench 1 Units 0   apixaban (ELIQUIS) 5 MG TABS tablet Take 1 tablet (5 mg total) by mouth 2 (two) times daily. 60 tablet 11   augmented betamethasone dipropionate (DIPROLENE-AF) 0.05 % cream Apply 1 application topically daily. As needed     carisoprodol (SOMA) 350 MG tablet TAKE 1 TABLET BY MOUTH THREE TIMES A DAY AS NEEDED FOR MUSCLE SPASMS 90 tablet 5   carvedilol (COREG) 6.25 MG tablet Take 1 tablet (6.25 mg total) by mouth 2 (two) times daily. 60 tablet 11   Cholecalciferol 100 MCG (4000 UT) CAPS 1 tab by mouth once daily 30 capsule 99   dapagliflozin propanediol (FARXIGA) 10 MG TABS tablet Take 1 tablet (10 mg total) by mouth daily before breakfast. 30 tablet 11   doxepin (SINEQUAN) 10 MG capsule TAKE 2 CAPSULES (20 MG TOTAL) BY MOUTH AT BEDTIME AS NEEDED. 180 capsule 1   Dupilumab (DUPIXENT) 300 MG/2ML SOPN Inject 1 Dose into the skin every 14 (fourteen) days.     eplerenone (INSPRA) 25 MG tablet Take 1 tablet (25 mg total) by mouth daily. 90 tablet 3   Evolocumab (REPATHA SURECLICK) 211 MG/ML SOAJ Inject 1 pen  into the skin every 14 (fourteen) days. 6 mL 3   fluticasone (FLONASE) 50 MCG/ACT nasal spray PLACE 2 SPRAYS INTO BOTH NOSTRILS DAILY AS NEEDED FOR ALLERGIES OR RHINITIS. 48 mL 1   hydrOXYzine (ATARAX/VISTARIL) 10 MG tablet TAKE 1 TABLET BY MOUTH 3 TIMES DAILY AS NEEDED FOR ITCHING. 60 tablet 2   ketoconazole (NIZORAL) 2 % cream Apply 1 application topically daily. (Patient taking differently: Apply 1 application topically daily. As needed) 30 g 1   lidocaine (LIDODERM) 5 % APPLY 1 PATCH TO AFFECTED AREA FOR UP TO 12 HOURS . DO NOT USE MORE THAN ONE PATCH IN 24 HOURS. (Patient taking differently: As needed) 90 patch 2   losartan (COZAAR) 25 MG tablet Take 0.5 tablets (12.5 mg total) by mouth daily. 45 tablet 3   metolazone (ZAROXOLYN) 2.5 MG tablet Take 1 tablet (2.5 mg total) by mouth as directed. With additional 40 meq of potassium 5 tablet 0   omeprazole (PRILOSEC) 20 MG capsule TAKE 1 CAPSULE BY MOUTH TWICE A DAY (Patient taking differently: as needed.) 180 capsule 1   potassium chloride SA (KLOR-CON) 20 MEQ tablet Take 1 tablet (20 mEq total) by mouth daily. 90 tablet 3   tamsulosin (FLOMAX) 0.4 MG CAPS capsule Take 0.4 mg by mouth daily.     torsemide (DEMADEX) 20 MG tablet Take 20 mg by mouth. Patient is taking 4 tablets in the morning and 3 tablets in the evening.     traMADol (ULTRAM) 50 MG tablet TAKE 1 TABLET BY MOUTH DAILY AS NEEDED. 30 tablet 5   traZODone (DESYREL) 100 MG tablet 2 tab by mouth at bedtime as needed 180 tablet 1   triamcinolone (KENALOG) 0.1 % paste Use as directed in the mouth or throat 2 (two) times daily. 5 g 2   TUMS 500 MG chewable tablet Chew 500-2,000 mg by mouth every 4 (four) hours as needed for indigestion or heartburn.      venlafaxine XR (EFFEXOR-XR) 75 MG 24  hr capsule Take 1 capsule (75 mg total) by mouth daily with breakfast. 90 capsule 1   zolpidem (AMBIEN) 10 MG tablet TAKE 1 TABLET BY MOUTH AT BEDTIME AS NEEDED 90 tablet 1   No current  facility-administered medications on file prior to visit.        ROS:  All others reviewed and negative.  Objective        PE:  BP 122/76 (BP Location: Right Arm, Patient Position: Sitting, Cuff Size: Large)   Pulse 82   Temp 97.8 F (36.6 C) (Oral)   Ht 5\' 7"  (1.702 m)   Wt 243 lb (110.2 kg)   SpO2 97%   BMI 38.06 kg/m                 Constitutional: Pt appears in NAD, mild ill               HENT: Head: NCAT.                Right Ear: External ear normal.                 Left Ear: External ear normal.                Eyes: . Pupils are equal, round, and reactive to light. Conjunctivae and EOM are normal               Nose: without d/c or deformity               Neck: Neck supple. Gross normal ROM               Cardiovascular: Normal rate and regular rhythm.                 Pulmonary/Chest: Effort normal and breath sounds decreasedwithout rales but bilat trace wheezing.                Abd:  Soft, NT, ND, + BS, no organomegaly               Neurological: Pt is alert. At baseline orientation, motor grossly intact               Skin: Skin is warm. No rashes, no other new lesions, LE edema - none               Psychiatric: Pt behavior is normal without agitation   Micro: none  Cardiac tracings I have personally interpreted today:  none  Pertinent Radiological findings (summarize): none   Lab Results  Component Value Date   WBC 9.0 06/28/2021   HGB 11.7 (L) 06/28/2021   HCT 38.0 (L) 06/28/2021   PLT 269 06/28/2021   GLUCOSE 84 06/28/2021   CHOL 153 02/28/2021   TRIG 335.0 (H) 02/28/2021   HDL 33.60 (L) 02/28/2021   LDLDIRECT 72.0 02/28/2021   LDLCALC 162 (H) 10/20/2020   ALT 15 05/05/2021   AST 17 05/05/2021   NA 137 06/28/2021   K 3.7 06/28/2021   CL 104 06/28/2021   CREATININE 1.14 06/28/2021   BUN 22 06/28/2021   CO2 24 06/28/2021   TSH 1.46 02/28/2021   PSA 2.26 02/28/2021   INR 1.1 05/08/2021   HGBA1C 5.5 05/05/2021   MICROALBUR 0.8 02/28/2021    Assessment/Plan:  [name] is a 65 y.o. White or Caucasian [1] male with  has a past medical history of AICD (automatic cardioverter/defibrillator) present, Anemia, ANXIETY, Arthritis, Asthma, Atrial fibrillation ([place]) (09/06/2015), Cardiomyopathy (  [place]), CHF (congestive heart failure) ([place]), Chronic constipation, COPD (chronic obstructive pulmonary disease) ([employer]), DEPRESSION, Diverticulitis, DYSKINESIA, ESOPHAGUS, Emphysema of lung ([place]), Essential hypertension, FIBROMYALGIA, GERD (gastroesophageal reflux disease), Glaucoma, HYPERLIPIDEMIA, INSOMNIA, Myocardial infarction [facility]), Myocardial infarction ([place]), O2 dependent, Paroxysmal atrial fibrillation ([place]), Peripheral neuropathy, Pneumonia (12/2016), Rash and other nonspecific skin eruption (04/12/2009), SLEEP APNEA, OBSTRUCTIVE, Syncope, and Type II diabetes mellitus ([place]).  Acute cough Mild to mod, c/w bronchitis vs pna, for antibx course, for cxr,   Cough med prn, to f/u any worsening symptoms or concerns  COPD exacerbation ([place]) Mild to mod, for predpac asd,  to f/u any worsening symptoms or concerns  Type 2 diabetes mellitus ([place]) Lab Results  Component Value Date   HGBA1C 5.5 05/05/2021   Stable, pt to continue current medical treatment  - diet, and follow cbgs closely on prednisone   Vitamin D deficiency Last vitamin D Lab Results  Component Value Date   VD25OH 24.78 (L) 02/28/2021   Ow, to start oral replacement  Followup: Return if symptoms worsen or fail to improve.  [name], MD 07/22/2021 12:13 PM [facility]

## 2021-07-22 NOTE — Assessment & Plan Note (Signed)
Lab Results  Component Value Date   HGBA1C 5.5 05/05/2021   Stable, pt to continue current medical treatment  - diet, and follow cbgs closely on prednisone

## 2021-07-22 NOTE — Assessment & Plan Note (Signed)
Last vitamin D Lab Results  Component Value Date   VD25OH 24.78 (L) 02/28/2021   Ow, to start oral replacement

## 2021-07-22 NOTE — Assessment & Plan Note (Signed)
/  Mild to mod, for predpac asd,  to f/u any worsening symptoms or concerns 

## 2021-07-22 NOTE — Assessment & Plan Note (Addendum)
Mild to mod, c/w bronchitis vs pna, for antibx course, for cxr,   Cough med prn, to f/u any worsening symptoms or concerns

## 2021-07-25 ENCOUNTER — Other Ambulatory Visit: Payer: Self-pay

## 2021-07-25 ENCOUNTER — Other Ambulatory Visit: Payer: Self-pay | Admitting: Cardiology

## 2021-07-25 ENCOUNTER — Other Ambulatory Visit: Payer: Self-pay | Admitting: Internal Medicine

## 2021-07-25 ENCOUNTER — Ambulatory Visit: Payer: Medicare HMO | Admitting: Podiatry

## 2021-07-25 DIAGNOSIS — B351 Tinea unguium: Secondary | ICD-10-CM

## 2021-07-25 DIAGNOSIS — M79674 Pain in right toe(s): Secondary | ICD-10-CM | POA: Diagnosis not present

## 2021-07-25 DIAGNOSIS — M79675 Pain in left toe(s): Secondary | ICD-10-CM | POA: Diagnosis not present

## 2021-07-26 ENCOUNTER — Encounter: Payer: Medicare HMO | Admitting: Gastroenterology

## 2021-07-26 ENCOUNTER — Ambulatory Visit (INDEPENDENT_AMBULATORY_CARE_PROVIDER_SITE_OTHER): Payer: Medicare HMO

## 2021-07-26 DIAGNOSIS — I5022 Chronic systolic (congestive) heart failure: Secondary | ICD-10-CM

## 2021-07-26 NOTE — Progress Notes (Signed)
  Subjective:  Patient ID: [name], male    DOB: 10/07/1956,  MRN: [id number]  Chief Complaint  Patient presents with   Nail Problem    Diabetic foot care     65 y.o. male returns with the above complaint. History confirmed with patient.  He is doing well having his left knee surgery at the Monday after Thanksgiving  Objective:  Physical Exam: warm, good capillary refill, no trophic changes or ulcerative lesions, normal sensory exam, +2 DP and PT reduced bilateral and venous stasis dermatitis noted.  Moderate to severe hallux abductovalgus with hammertoe contractures 2 through 5 bilaterally, right worse than left.  Summary:  Right: Resting right ankle-brachial index indicates noncompressible right  lower extremity arteries. The right toe-brachial index is normal. ABIs are  unreliable.   Left: Resting left ankle-brachial index is within normal range. No  evidence of significant left lower extremity arterial disease. The left  toe-brachial index is normal.       *See table(s) above for measurements and observations.         Radiographs: X-ray of the right foot: Moderate hallux abductovalgus with the presence of metatarsus adductus deformity and pes planus Assessment:   1. Pain due to onychomycosis of toenails of both feet      Plan:  Patient was evaluated and treated and all questions answered.  Patient educated on diabetes. Discussed proper diabetic foot care and discussed risks and complications of disease. Educated patient in depth on reasons to return to the office immediately should he/she discover anything concerning or new on the feet. All questions answered. Discussed proper shoes as well.  Discussed the etiology and treatment options for the condition in detail with the patient. Educated patient on the topical and oral treatment options for mycotic nails. Recommended debridement of the nails today. Sharp and mechanical debridement performed of all painful and  mycotic nails today. Nails debrided in length and thickness using a nail nipper to level of comfort. Discussed treatment options including appropriate shoe gear. Follow up as needed for painful nails.  Ingrowing corners of hallux nails debrided and slant back fashion  Is having his left knee replacement soon.  He would like to plan for surgery on the bunions for next year.  We will plan this after his next visit  Return in about 9 weeks (around 09/26/2021) for at risk diabetic foot care.

## 2021-07-28 LAB — CUP PACEART REMOTE DEVICE CHECK
Battery Remaining Longevity: 34 mo
Battery Voltage: 2.95 V
Brady Statistic AP VP Percent: 0 %
Brady Statistic AP VS Percent: 0 %
Brady Statistic AS VP Percent: 0 %
Brady Statistic AS VS Percent: 0 %
Brady Statistic RA Percent Paced: 0 %
Brady Statistic RV Percent Paced: 99.94 %
Date Time Interrogation Session: 20221012042406
HighPow Impedance: 78 Ohm
Implantable Lead Implant Date: 20181205
Implantable Lead Implant Date: 20181205
Implantable Lead Location: 753858
Implantable Lead Location: 753860
Implantable Lead Model: 4398
Implantable Pulse Generator Implant Date: 20181205
Lead Channel Impedance Value: 1026 Ohm
Lead Channel Impedance Value: 1026 Ohm
Lead Channel Impedance Value: 1064 Ohm
Lead Channel Impedance Value: 1064 Ohm
Lead Channel Impedance Value: 228 Ohm
Lead Channel Impedance Value: 276.59 Ohm
Lead Channel Impedance Value: 276.59 Ohm
Lead Channel Impedance Value: 276.59 Ohm
Lead Channel Impedance Value: 276.59 Ohm
Lead Channel Impedance Value: 342 Ohm
Lead Channel Impedance Value: 399 Ohm
Lead Channel Impedance Value: 4047 Ohm
Lead Channel Impedance Value: 456 Ohm
Lead Channel Impedance Value: 456 Ohm
Lead Channel Impedance Value: 703 Ohm
Lead Channel Impedance Value: 703 Ohm
Lead Channel Impedance Value: 817 Ohm
Lead Channel Impedance Value: 836 Ohm
Lead Channel Pacing Threshold Amplitude: 0.375 V
Lead Channel Pacing Threshold Pulse Width: 0.4 ms
Lead Channel Sensing Intrinsic Amplitude: 3.25 mV
Lead Channel Sensing Intrinsic Amplitude: 3.25 mV
Lead Channel Setting Pacing Amplitude: 2.25 V
Lead Channel Setting Pacing Amplitude: 2.5 V
Lead Channel Setting Pacing Pulse Width: 0.4 ms
Lead Channel Setting Pacing Pulse Width: 0.6 ms
Lead Channel Setting Sensing Sensitivity: 0.3 mV

## 2021-07-31 ENCOUNTER — Ambulatory Visit (INDEPENDENT_AMBULATORY_CARE_PROVIDER_SITE_OTHER): Payer: Medicare HMO

## 2021-07-31 DIAGNOSIS — I5022 Chronic systolic (congestive) heart failure: Secondary | ICD-10-CM

## 2021-07-31 DIAGNOSIS — Z95810 Presence of automatic (implantable) cardiac defibrillator: Secondary | ICD-10-CM

## 2021-08-01 NOTE — Progress Notes (Signed)
EPIC Encounter for ICM Monitoring  Patient Name: [name] is a 65 y.o. male Date: 08/01/2021 Primary Care Physican: [name], MD Primary Cardiologist: [name] Electrophysiologist: Vergie Living Pacing: 99.9%    06/07/2021 Weight: 245 lbs 08/01/2021 Weight: 243 lbs     Spoke with patient and heart failure questions reviewed.  Pt asymptomatic for fluid accumulation and feeling well.  He has been eating some foods high in salt.   Optivol thoracic impedance suggesting possible fluid accumulation starting 10/14.     Prescribed:  Torsemide 20 mg to 4 tablets (80 mg total) every morning and 3 tablets every afternoon.   Potassium 20 mEq take 1 tablet daily Spironolactone 25 mg take 1 tablet daily.   Labs: 06/09/2021 Creatinine 1.22, BUN 23, Potassium 3.7, Sodium 137, GFR >60 05/24/2021 Creatinine 0.95, BUN 14, Potassium 4.5, Sodium 135. GFR >60 05/17/2021 Creatinine 1.11, BUN 14, Potassium 4.0, Sodium 135, GFR >60 05/10/2021 Creatinine 0.77, BUN 7,   Potassium 4.0, Sodium 137, GFR >60  05/09/2021 Creatinine 0.70, BUN 7,   Potassium 3.8, Sodium 139, GFR >60  05/08/2021 Creatinine 0.74, BUN 6,   Potassium 3.5, Sodium 139, GFR >60  05/07/2021 Creatinine 0.75, BUN 8,   Potassium 3.5, Sodium 138, GFR >60  05/06/2021 Creatinine 1.15, BUN 17, Potassium 4.0, Sodium 136, GFR >6  05/05/2021 Creatinine 1.45, BUN 21, Potassium 3.9, Sodium 136, GFR 53  A complete set of results can be found in Results Review.   Recommendations:   Encouraged to limit salt intake.  He will adjust diet over the next 3 days.   Follow-up plan: ICM clinic phone appointment on 08/04/2021 to recheck fluid levels   91 day device clinic remote transmission 10/25/2021.   EP/Cardiology Office Visits:   09/15/2021 with Dr [name].  Recall 02/22/2021 with Dr [name].    Copy of ICM check sent to Dr. [name].    3 month ICM trend: 07/31/2021.    1 Year ICM trend:       [name], RN 08/01/2021 4:08 PM

## 2021-08-02 ENCOUNTER — Encounter: Payer: Self-pay | Admitting: Orthopaedic Surgery

## 2021-08-03 ENCOUNTER — Other Ambulatory Visit: Payer: Self-pay | Admitting: Internal Medicine

## 2021-08-03 DIAGNOSIS — I428 Other cardiomyopathies: Secondary | ICD-10-CM

## 2021-08-03 DIAGNOSIS — I4819 Other persistent atrial fibrillation: Secondary | ICD-10-CM

## 2021-08-03 DIAGNOSIS — I255 Ischemic cardiomyopathy: Secondary | ICD-10-CM

## 2021-08-03 NOTE — Progress Notes (Signed)
Remote ICD transmission.   

## 2021-08-04 ENCOUNTER — Ambulatory Visit (INDEPENDENT_AMBULATORY_CARE_PROVIDER_SITE_OTHER): Payer: Medicare HMO

## 2021-08-04 ENCOUNTER — Telehealth: Payer: Self-pay

## 2021-08-04 DIAGNOSIS — I5022 Chronic systolic (congestive) heart failure: Secondary | ICD-10-CM

## 2021-08-04 DIAGNOSIS — Z95810 Presence of automatic (implantable) cardiac defibrillator: Secondary | ICD-10-CM

## 2021-08-04 NOTE — Progress Notes (Signed)
EPIC Encounter for ICM Monitoring  Patient Name: [name] is a 65 y.o. male Date: 08/04/2021 Primary Care Physican: [name], MD Primary Cardiologist: [name] Electrophysiologist: Vergie Living Pacing: 100%    06/07/2021 Weight: 245 lbs 08/01/2021 Weight: 243 lbs     Attempted call to patient and unable to reach.  Left detailed message per DPR regarding transmission. Transmission reviewed.    Optivol thoracic impedance suggesting fluid levels returned to normal.     Prescribed:  Torsemide 20 mg to 4 tablets (80 mg total) every morning and 3 tablets every afternoon.   Potassium 20 mEq take 1 tablet daily Spironolactone 25 mg take 1 tablet daily.   Labs: 06/09/2021 Creatinine 1.22, BUN 23, Potassium 3.7, Sodium 137, GFR >60 05/24/2021 Creatinine 0.95, BUN 14, Potassium 4.5, Sodium 135. GFR >60 05/17/2021 Creatinine 1.11, BUN 14, Potassium 4.0, Sodium 135, GFR >60 05/10/2021 Creatinine 0.77, BUN 7,   Potassium 4.0, Sodium 137, GFR >60  05/09/2021 Creatinine 0.70, BUN 7,   Potassium 3.8, Sodium 139, GFR >60  05/08/2021 Creatinine 0.74, BUN 6,   Potassium 3.5, Sodium 139, GFR >60  05/07/2021 Creatinine 0.75, BUN 8,   Potassium 3.5, Sodium 138, GFR >60  05/06/2021 Creatinine 1.15, BUN 17, Potassium 4.0, Sodium 136, GFR >6  05/05/2021 Creatinine 1.45, BUN 21, Potassium 3.9, Sodium 136, GFR 53  A complete set of results can be found in Results Review.   Recommendations:   Left voice mail with ICM number and encouraged to call if experiencing any fluid symptoms.   Follow-up plan: ICM clinic phone appointment on 11/04/2020   91 day device clinic remote transmission 10/25/2021.   EP/Cardiology Office Visits:   09/15/2021 with Dr [name].  Recall 02/22/2021 with Dr [name].    Copy of ICM check sent to Dr. [name].    3 month ICM trend: 08/04/2021.    1 Year ICM trend:       [name], RN 08/04/2021 4:42 PM

## 2021-08-04 NOTE — Telephone Encounter (Signed)
Remote ICM transmission received.  Attempted call to patient regarding ICM remote transmission and left detailed message per DPR.  Advised to return call for any fluid symptoms or questions. Next ICM remote transmission scheduled 09/04/2021.

## 2021-08-07 DIAGNOSIS — L308 Other specified dermatitis: Secondary | ICD-10-CM | POA: Diagnosis not present

## 2021-08-10 ENCOUNTER — Ambulatory Visit (INDEPENDENT_AMBULATORY_CARE_PROVIDER_SITE_OTHER): Payer: Medicare HMO | Admitting: *Deleted

## 2021-08-10 DIAGNOSIS — I255 Ischemic cardiomyopathy: Secondary | ICD-10-CM

## 2021-08-10 DIAGNOSIS — I4819 Other persistent atrial fibrillation: Secondary | ICD-10-CM

## 2021-08-10 NOTE — Patient Instructions (Signed)
Visit Information  [name], it was nice talking with you today.   I am glad to hear that your knee replacement surgery has been scheduled and wish you the very best with the surgery!   I look forward to talking to you again for an update on Wednesday, November 20, 2021 at 1:00 pm- please be listening out for my call that day.  I will call as close to 1:00 pm as possible.   If you need to cancel or re-schedule our telephone visit, please call [phone] and one of our care guides will be happy to assist you.   I look forward to hearing about your progress.   Please don't hesitate to contact me if I can be of assistance to you before our next scheduled telephone appointment.   [name], RN, BSN, [facility] RN Care Coordination- [place] [phone]: direct office [phone]: mobile   PATIENT GOALS:  Goals Addressed             This Visit's Progress    Track and Manage Fluids and Swelling-Heart Failure   On track    Timeframe:  Long-Range Goal Priority:  Medium Start Date:        04/27/21                     Expected End Date:     04/27/22                  Follow Up Date 11/20/21   Continue to weigh yourself daily and write your weights at home down on paper or on the calendar- we will review these each time we have a telephone visit.  Today you reported a weight of 255 lbs Call office if I gain more than 2 pounds in one day or 5 pounds in one week, especially if this is associated with chest tightness, swelling in your legs/ ankles, and new or worsened shortness of breath Take your medications as prescribed; call your doctors if you develop concerns around your medications Watch for swelling in feet, ankles and legs every day: contact your cardiologist if you believe your swelling is increasing Keep legs up while sitting Continue to limit the salt in your diet: decrease your salt intake and use salt only in moderation If you develop signs or  symptoms of being in Atrial Fibrillation or having heart rhythm disturbances, call your cardiology (heart doctor) team right away  Why is this important?   It is important to check your weight daily and watch how much salt and liquids you have.  It will help you to manage your heart failure.           Atrial Fibrillation Atrial fibrillation is a type of irregular or rapid heartbeat (arrhythmia). In atrial fibrillation, the top part of the heart (atria) beats in an irregular pattern. This makes the heart unable to pump blood normally and effectively. The goal of treatment is to prevent blood clots from forming, control your heart rate, or restore your heartbeat to a normal rhythm. If this condition is not treated, it can cause serious problems, such as a weakened heart muscle (cardiomyopathy) or a stroke. What are the causes? This condition is often caused by medical conditions that damage the heart's electrical system. These include: High blood pressure (hypertension). This is the most common cause. Certain heart problems or conditions, such as heart failure, coronary artery disease, heart valve problems, or heart surgery.  Diabetes. Overactive thyroid (hyperthyroidism). Obesity. Chronic kidney disease. In some cases, the cause of this condition is not known. What increases the risk? This condition is more likely to develop in: Older people. People who smoke. Athletes who do endurance exercise. People who have a family history of atrial fibrillation. Men. People who use drugs. People who drink a lot of alcohol. People who have lung conditions, such as emphysema, pneumonia, or COPD. People who have obstructive sleep apnea. What are the signs or symptoms? Symptoms of this condition include: A feeling that your heart is racing or beating irregularly. Discomfort or pain in your chest. Shortness of breath. Sudden light-headedness or weakness. Tiring easily during exercise or  activity. Fatigue. Syncope (fainting). Sweating. In some cases, there are no symptoms. How is this diagnosed? Your health care provider may detect atrial fibrillation when taking your pulse. If detected, this condition may be diagnosed with: An electrocardiogram (ECG) to check electrical signals of the heart. An ambulatory cardiac monitor to record your heart's activity for a few days. A transthoracic echocardiogram (TTE) to create pictures of your heart. A transesophageal echocardiogram (TEE) to create even closer pictures of your heart. A stress test to check your blood supply while you exercise. Imaging tests, such as a CT scan or chest X-ray. Blood tests. How is this treated? Treatment depends on underlying conditions and how you feel when you experience atrial fibrillation. This condition may be treated with: Medicines to prevent blood clots or to treat heart rate or heart rhythm problems. Electrical cardioversion to reset the heart's rhythm. A pacemaker to correct abnormal heart rhythm. Ablation to remove the heart tissue that sends abnormal signals. Left atrial appendage closure to seal the area where blood clots can form. In some cases, underlying conditions will be treated. Follow these instructions at home: Medicines Take over-the counter and prescription medicines only as told by your health care provider. Do not take any new medicines without talking to your health care provider. If you are taking blood thinners: Talk with your health care provider before you take any medicines that contain aspirin or NSAIDs, such as ibuprofen. These medicines increase your risk for dangerous bleeding. Take your medicine exactly as told, at the same time every day. Avoid activities that could cause injury or bruising, and follow instructions about how to prevent falls. Wear a medical alert bracelet or carry a card that lists what medicines you take. Lifestyle   Do not use any products  that contain nicotine or tobacco, such as cigarettes, e-cigarettes, and chewing tobacco. If you need help quitting, ask your health care provider. Eat heart-healthy foods. Talk with a dietitian to make an eating plan that is right for you. Exercise regularly as told by your health care provider. Do not drink alcohol. Lose weight if you are overweight. Do not use drugs, including cannabis. General instructions If you have obstructive sleep apnea, manage your condition as told by your health care provider. Do not use diet pills unless your health care provider approves. Diet pills can make heart problems worse. Keep all follow-up visits as told by your health care provider. This is important. Contact a health care provider if you: Notice a change in the rate, rhythm, or strength of your heartbeat. Are taking a blood thinner and you notice more bruising. Tire more easily when you exercise or do heavy work. Have a sudden change in weight. Get help right away if you have:  Chest pain, abdominal pain, sweating, or weakness. Trouble breathing.  Side effects of blood thinners, such as blood in your vomit, stool, or urine, or bleeding that cannot stop. Any symptoms of a stroke. "BE FAST" is an easy way to remember the main warning signs of a stroke: B - Balance. Signs are dizziness, sudden trouble walking, or loss of balance. E - Eyes. Signs are trouble seeing or a sudden change in vision. F - Face. Signs are sudden weakness or numbness of the face, or the face or eyelid drooping on one side. A - Arms. Signs are weakness or numbness in an arm. This happens suddenly and usually on one side of the body. S - Speech. Signs are sudden trouble speaking, slurred speech, or trouble understanding what people say. T - Time. Time to call emergency services. Write down what time symptoms started. Other signs of a stroke, such as: A sudden, severe headache with no known cause. Nausea or  vomiting. Seizure. These symptoms may represent a serious problem that is an emergency. Do not wait to see if the symptoms will go away. Get medical help right away. Call your local emergency services (911 in the [place] Do not drive yourself to the hospital. Summary Atrial fibrillation is a type of irregular or rapid heartbeat (arrhythmia). Symptoms include a feeling that your heart is beating fast or irregularly. You may be given medicines to prevent blood clots or to treat heart rate or heart rhythm problems. Get help right away if you have signs or symptoms of a stroke. Get help right away if you cannot catch your breath or have chest pain or pressure. This information is not intended to replace advice given to you by your health care provider. Make sure you discuss any questions you have with your health care provider. Document Revised: 03/25/2019 Document Reviewed: 03/25/2019 Elsevier Patient Education  2022 [employer].   Patient verbalizes understanding of instructions provided today and agrees to view in MyChart Telephone follow up appointment with care management team member scheduled for:  Wednesday, November 20, 2021 at 1:00 pm The patient has been provided with contact information for the care management team and has been advised to call with any health related questions or concerns  [name], RN, BSN, [place] [phone]: direct office [phone]: mobile

## 2021-08-10 NOTE — Chronic Care Management (AMB) (Signed)
Chronic Care Management   CCM RN Visit Note  08/10/2021 Name: [name] MRN: [id number] DOB: 1955/11/28  Subjective: [name] is a 65 y.o. year old male who is a primary care patient of [name], MD. The care management team was consulted for assistance with disease management and care coordination needs.    Engaged with patient by telephone for follow up visit in response to provider referral for case management and/or care coordination services.   Consent to Services:  The patient was given information about Chronic Care Management services, agreed to services, and gave verbal consent prior to initiation of services.  Please see initial visit note for detailed documentation.  Patient agreed to services and verbal consent obtained.   Assessment: Review of patient past medical history, allergies, medications, health status, including review of consultants reports, laboratory and other test data, was performed as part of comprehensive evaluation and provision of chronic care management services.   SDOH (Social Determinants of Health) assessments and interventions performed:  SDOH Interventions    Flowsheet Row Most Recent Value  SDOH Interventions   Food Insecurity Interventions Intervention Not Indicated  Housing Interventions Intervention Not Indicated  Transportation Interventions Intervention Not Indicated  [Patient continues driving self]       CCM Care Plan Allergies  Allergen Reactions   Amiodarone Other (See Comments)    hyperthyroidism   Statins Other (See Comments)    myalgia   Tape Other (See Comments)    Skin Tears Use Paper Tape Only   Outpatient Encounter Medications as of 08/10/2021  Medication Sig   acetaminophen (TYLENOL) 500 MG tablet Take 500 mg by mouth every 6 (six) hours as needed.   albuterol (VENTOLIN HFA) 108 (90 Base) MCG/ACT inhaler INHALE 2 PUFFS 4 TIMES A DAY as needed   ALPRAZolam (XANAX) 0.5 MG tablet TAKE 2 TABLETS BY MOUTH AT  BEDTIME   AMBULATORY NON FORMULARY MEDICATION Rolling walker with a bench   apixaban (ELIQUIS) 5 MG TABS tablet Take 1 tablet (5 mg total) by mouth 2 (two) times daily.   augmented betamethasone dipropionate (DIPROLENE-AF) 0.05 % cream Apply 1 application topically daily. As needed   carisoprodol (SOMA) 350 MG tablet TAKE 1 TABLET BY MOUTH THREE TIMES A DAY AS NEEDED FOR MUSCLE SPASMS   carvedilol (COREG) 6.25 MG tablet Take 1 tablet (6.25 mg total) by mouth 2 (two) times daily.   Cholecalciferol 100 MCG (4000 UT) CAPS 1 tab by mouth once daily   dapagliflozin propanediol (FARXIGA) 10 MG TABS tablet Take 1 tablet (10 mg total) by mouth daily before breakfast.   doxepin (SINEQUAN) 10 MG capsule TAKE 2 CAPSULES (20 MG TOTAL) BY MOUTH AT BEDTIME AS NEEDED.   Dupilumab (DUPIXENT) 300 MG/2ML SOPN Inject 1 Dose into the skin every 14 (fourteen) days.   eplerenone (INSPRA) 25 MG tablet Take 1 tablet (25 mg total) by mouth daily.   Evolocumab (REPATHA SURECLICK) 761 MG/ML SOAJ INJECT 1 PEN INTO THE SKIN EVERY 14 (FOURTEEN) DAYS.   fluticasone (FLONASE) 50 MCG/ACT nasal spray PLACE 2 SPRAYS INTO BOTH NOSTRILS DAILY AS NEEDED FOR ALLERGIES OR RHINITIS.   hydrOXYzine (ATARAX/VISTARIL) 10 MG tablet TAKE 1 TABLET BY MOUTH 3 TIMES DAILY AS NEEDED FOR ITCHING.   ketoconazole (NIZORAL) 2 % cream Apply 1 application topically daily. (Patient taking differently: Apply 1 application topically daily. As needed)   lidocaine (LIDODERM) 5 % APPLY 1 PATCH TO AFFECTED AREA FOR UP TO 12 HOURS . DO NOT USE MORE  THAN ONE PATCH IN 24 HOURS. (Patient taking differently: As needed)   losartan (COZAAR) 25 MG tablet Take 0.5 tablets (12.5 mg total) by mouth daily.   metolazone (ZAROXOLYN) 2.5 MG tablet Take 1 tablet (2.5 mg total) by mouth as directed. With additional 40 meq of potassium   omeprazole (PRILOSEC) 20 MG capsule TAKE 1 CAPSULE BY MOUTH TWICE A DAY (Patient taking differently: as needed.)   polyethylene  glycol-electrolytes (NULYTELY) 420 g solution    potassium chloride SA (KLOR-CON) 20 MEQ tablet Take 1 tablet (20 mEq total) by mouth daily.   predniSONE (DELTASONE) 10 MG tablet 3 tabs by mouth per day for 3 days,2tabs per day for 3 days,1tab per day for 3 days   tamsulosin (FLOMAX) 0.4 MG CAPS capsule Take 0.4 mg by mouth daily.   torsemide (DEMADEX) 20 MG tablet Take 20 mg by mouth. Patient is taking 4 tablets in the morning and 3 tablets in the evening.   traMADol (ULTRAM) 50 MG tablet TAKE 1 TABLET BY MOUTH DAILY AS NEEDED.   traZODone (DESYREL) 100 MG tablet 2 tab by mouth at bedtime as needed   triamcinolone (KENALOG) 0.1 % paste Use as directed in the mouth or throat 2 (two) times daily.   TUMS 500 MG chewable tablet Chew 500-2,000 mg by mouth every 4 (four) hours as needed for indigestion or heartburn.    venlafaxine XR (EFFEXOR-XR) 75 MG 24 hr capsule Take 1 capsule (75 mg total) by mouth daily with breakfast.   zolpidem (AMBIEN) 10 MG tablet TAKE 1 TABLET BY MOUTH AT BEDTIME AS NEEDED   No facility-administered encounter medications on file as of 08/10/2021.   Patient Active Problem List   Diagnosis Date Noted   Acute cough 07/21/2021   Bursitis of left hip 06/13/2021   Tooth fracture 06/11/2021   Pain and swelling of right lower leg 05/18/2021   Hemorrhagic shock ([place]) 05/05/2021   Gastrointestinal hemorrhage    Diverticulitis of colon    Preop exam for internal medicine 03/04/2021   B12 deficiency 03/04/2021   Vitamin D deficiency 03/01/2021   Myalgia 12/01/2020   Laceration of left leg 09/17/2020   Statin myopathy 08/02/2020   Rotator cuff arthropathy of both shoulders 02/23/2020   (HFpEF) heart failure with preserved ejection fraction ([place]) 02/22/2020   ICD (implantable cardioverter-defibrillator) in place 12/27/2019   COVID-19 virus infection 12/09/2019   COVID-19 12/09/2019   Hypotension 12/09/2019   Upper respiratory infection 11/27/2019   COPD exacerbation ([place])  11/27/2019   Elevated PSA 09/23/2019   Lip lesion 12/23/2018   Diabetic ulcer of left lower leg with necrosis of muscle ([place]) 12/05/2018   Chronic bursitis of left shoulder 07/01/2018   Pain due to total knee replacement ([place]) 03/31/2018   Rotator cuff arthropathy, left 02/13/2018   Right rotator cuff tear arthropathy 01/14/2018   Increased prostate specific antigen (PSA) velocity 09/26/2017   Trigger point of thoracic region 07/04/2017   Right lumbar radiculopathy 05/15/2017   Right leg swelling 05/09/2017   Glaucoma 04/18/2017   History of nasal polyposis 02/19/2017   Chronic rhinitis 02/19/2017   Microhematuria 12/04/2016   Hyperlipidemia 08/15/2016   Encounter for well adult exam with abnormal findings 11/29/2015   Persistent atrial fibrillation ([place]) 03/30/2015   Syncope 03/30/2015   Acute on chronic diastolic heart failure ([place]) 03/30/2015   Former tobacco use 02/25/2015   Type 2 diabetes mellitus ([place]) 02/25/2015   Atrial fibrillation ([place]) 02/25/2015   Anemia 02/23/2015   Thyrotoxicosis 02/23/2015  Hx of adenomatous colonic polyps 10/21/2014   De Quervain's tenosynovitis, right 04/30/2014   Osteoarthritis of right knee 02/19/2014   Encounter for therapeutic drug monitoring 11/17/2013   Thoracic degenerative disc disease 06/17/2013   Thoracic spondylosis without myelopathy 02/11/2013   Lumbosacral spondylosis without myelopathy 02/11/2013   Degenerative joint disease of knee, left 02/11/2013   COPD (chronic obstructive pulmonary disease) ([place]) 08/20/2012   Bundle branch block, right and extreme left anterior fascicular 05/05/2012   Nonischemic cardiomyopathy ([place]) 05/02/2012   Other primary cardiomyopathies 05/02/2012   Anticoagulated on warfarin 03/21/2012   Spongiotic dermatitis 09/26/2011   Past myocardial infarction 04/19/2011   UNSPECIFIED URETHRAL STRICTURE 12/26/2010   Chronic pain syndrome 05/18/2010   Obstructive sleep apnea 10/21/2007   NEPHROLITHIASIS,  HX OF 10/21/2007   Anxiety 06/09/2007   GERD (gastroesophageal reflux disease) 06/09/2007   Benign prostatic hyperplasia 06/09/2007   Morbid obesity ([place]) 06/03/2007   Depression 06/03/2007   Essential hypertension 06/03/2007   INSOMNIA 06/03/2007   Conditions to be addressed/monitored:  Atrial Fibrillation and CHF  Care Plan : Heart Failure (Adult)  Updates made by Knox Royalty, RN since 08/10/2021 12:00 AM     Problem: Symptom Exacerbation (Heart Failure)   Priority: Medium     Long-Range Goal: Symptom Exacerbation of CHF/ A-Fib Prevented or Minimized   Start Date: 04/27/2021  Expected End Date: 04/27/2022  This Visit's Progress: On track  Recent Progress: On track  Priority: Medium  Note:   Current Barriers:  Ongoing need for reinforcement and support in self-health management of heart failure in setting of multiple co-morbidities, including persistent Atrial Fibrillation Fragile state of health, multiple progressing chronic health conditions Hospitalization July 22-27, 2022 for GI bleeding Nurse Case Manager Clinical Goal(s):  04/27/21: Over the next 12 months, patient will verbalize ongoing understanding of Heart Failure and Atrial Fibrillation Action Plan and when to call doctor, as evidenced by patient/ caregiver reporting during CCM RN CM outreach of: Completion of daily weight monitoring/ recording (notifying MD of 3 lb weight gain over night or 5 lb in a week) Adhering to low salt diet Taking prescribed medications as prescribed Following action plan for development of Atrial Fibrillation or other heart rhythm disturbances at home Interventions:  Collaboration with [name], MD regarding development and update of comprehensive plan of care as evidenced by provider attestation and co-signature Inter-disciplinary care team collaboration (see longitudinal plan of care) Chart reviewed including relevant office notes, upcoming scheduled appointments, and lab  results Discussed current clinical condition with patient- he denies current clinical concerns; reports "doing real good today" Confirms previously reported dental issues "better;"- continues using dental paste prescribed by Dr. [name] Discussed and provided education around signs/ symptoms infection, corresponding action plan for same- explained to patient that dental infections can be serious and may lead to significant generalized infection; reminded patient of need to see dental provider to address this chronic issue- he reports he plans to see dentist/ oral surgeon "eventually" and have "all" of his teeth pulled Reviewed recent PCP office visit with patient 07/21/21- confirmed he took prescribed antibiotics and prednisone: reports cough and symptoms have now resolved Discussed upcoming plans for (L)TRK-- reports has been cleared for surgery and surgery will occur on 10/17/21 Reports orthopedic surgeon has agreed to discharge patient to short term SNF/ rehabilitation center post- upcoming [place] surgery Denies signs/ symptoms of being in A-Fib, other cardiac rhythm disturbances- states he has stayed in contact with ICD monitoring nurse at cardiology office Confirmed no  recent signs/ symptoms recurrent GI bleeding Updated SDOH- confirmed no new concerns around [facility] Reviewed with patient signs/ symptoms concerning for CHF/ A-Fib flare: he verbalizes good general understanding of same, but will continue to benefit from ongoing reinforcement of same Reviewed recent daily weights at home with patient: he reports weight today of 255 lbs- this is significantly increased from previously reported weights between 240-245 lbs: reports he has stayed in touch with ICD monitoring nurse at cardiology provider office, states that she "cleared" him form being volume/ fluid overloaded; he reports lower extremity/ abdominal swelling at baseline and denies increased shortness of breath or other symptoms concerning for CHF  exacerbation; sounds to be in no distress throughout phone call today Confirmed patient continues to report no medication concerns: continues taking diuretics as prescribed, continues to independently self-manage medications Reviewed with patient previously provided education around signs/ symptoms CHF yellow zone, along with corresponding action plan for same: patient will benefit from ongoing support and reinforcement Confirmed patient continues "to try" and follow low sodium, heart healthy diet Provided education around signs/ symptoms MI/ CVA, along with corresponding action plan for same Reports hoping to start going back to [employer] program at [facility] after TKR surgery: this was encouraged, with advice to not-over-do activity and gradually build up stamina Reviewed upcoming provider appointments with patient and confirmed that patient has plans to attend all as scheduled: sports medicine provider- 08/22/21; cardiology/ HF- 09/15/21; PCP- 09/18/21; podiatry- 09/26/21; (L) TKR surgery- 10/17/21; orthopedic surgery follow up post- TKR- 10/31/21 Discussed plans for CCM RN CM follow up, scheduled next telephone visit with patient after planned TKR in January and subsequent planned short-term SNF placement for rehabilitation following surgery Self-Care Activities:  Takes Heart Failure Medications as prescribed Weighs daily and record (notifying MD of 3 lb weight gain over night or 5 lb in a week) Verbalizes understanding of and follows CHF Action Plan Adheres to low sodium diet  Patient Goals:  Continue to weigh yourself daily and write your weights at home down on paper or on the calendar- we will review these each time we have a telephone visit.  Today you reported a weight of 255 lbs Call office if I gain more than 2 pounds in one day or 5 pounds in one week, especially if this is associated with chest tightness, swelling in your legs/ ankles, and new or worsened shortness of breath Take your  medications as prescribed; call your doctors if you develop concerns around your medications Watch for swelling in feet, ankles and legs every day: contact your cardiologist if you believe your swelling is increasing Keep legs up while sitting Continue to limit the salt in your diet: decrease your salt intake and use salt only in moderation If you develop signs or symptoms of being in Atrial Fibrillation or having heart rhythm disturbances, call your cardiology (heart doctor) team right away Follow Up Plan:  Telephone follow up appointment with care management team member scheduled for:  Wednesday November 20, 2021 at 1:00 pm The patient has been provided with contact information for the care management team and has been advised to call with any health related questions or concerns     Plan: Telephone follow up appointment with care management team member scheduled for:  Wednesday, November 20, 2021 at 1:00 pm The patient has been provided with contact information for the care management team and has been advised to call with any health related questions or concerns  [name], RN, BSN, CCRN  [place] [phone]: direct office [phone]: mobile

## 2021-08-11 ENCOUNTER — Other Ambulatory Visit: Payer: Self-pay | Admitting: Internal Medicine

## 2021-08-14 DIAGNOSIS — I4819 Other persistent atrial fibrillation: Secondary | ICD-10-CM | POA: Diagnosis not present

## 2021-08-14 DIAGNOSIS — I255 Ischemic cardiomyopathy: Secondary | ICD-10-CM | POA: Diagnosis not present

## 2021-08-21 NOTE — Progress Notes (Signed)
[place] Phone: [phone] Subjective:   [name], am serving as a scribe for Dr. Hulan Saas.  This visit occurred during the SARS-CoV-2 public health emergency.  Safety protocols were in place, including screening questions prior to the visit, additional usage of staff PPE, and extensive cleaning of exam room while observing appropriate contact time as indicated for disinfecting solutions.    I'm seeing this patient by the request  of:  [name], MD  CC: knee and shoulder pain   [id number]  07/11/2021 Patient does have significant arthritic changes noted.  Continuing to respond fairly well to injections.  Would like to try to space them out when possible.  Follow-up with me again 6 to 8 weeks.  I have discussed with patient multiple times about lifting anything frequently but at the moment patient weeks she is does not have any other treatment options available.  Chronic problem with exacerbation.  Patient is considered the possibility of surgical intervention but is still waiting for stability of his hemoglobin.  Patient is also on a blood thinner which makes it somewhat difficult.  Patient given another injection to buy him more time and likely will not be having it for only quite a while.  Follow-up with me again in 3 months or sooner if worsening pain.  Updated 08/22/2021 [name] is a 65 y.o. male coming in with complaint of L knee and B shoulder pain. Patient states that pain in both shoulders and L knee continue.   Pain increased in lower back since last night. Using lidocaine patch today for pain relief. Denies any radiating symptoms.        Past Medical History:  Diagnosis Date   AICD (automatic cardioverter/defibrillator) present    Anemia    supposed to be taking Vit B but doesn't   ANXIETY    takes Xanax nightly   Arthritis    Asthma    Albuterol prn and Advair  daily;also takes Prednisone daily   Atrial fibrillation ([place]) 09/06/2015   Cardiomyopathy ([place])    a. EF 25% TEE July 2013; b. EF normalized 2015;  c. 03/2015 Echo: EF 40-45%, difrf HK, PASP 38 mmHg, Mild MR, sev LAE/RAE.   CHF (congestive heart failure) (HCC)    Chronic constipation    takes OTC stool softener   COPD (chronic obstructive pulmonary disease) (HCC)    "one dr says COPD; one dr says emphysema" (09/18/2017)   DEPRESSION    takes Zoloft and Doxepin daily   Diverticulitis    DYSKINESIA, ESOPHAGUS    Emphysema of lung ([place])    "one dr says COPD; one dr says emphysema" (09/18/2017)   Essential hypertension        FIBROMYALGIA    GERD (gastroesophageal reflux disease)        Glaucoma    HYPERLIPIDEMIA    a. Intolerant to statins.   INSOMNIA    takes Ambien nightly   Myocardial infarction [facility])    a. 2012 Myoview notable for prior infarct;  b. 03/2015 Lexiscan CL: EF 37%, diff HK, small area of inferior infarct from apex to base-->Med Rx.   Myocardial infarction ([place])    O2 dependent    "2.5L q hs & prn" (09/18/2017)   Paroxysmal atrial fibrillation ([place])    a. CHA2DS2VASc = 3--> takes Coumadin;  b. 03/15/2015 Successful TEE/DCCV;  c. 03/2015 recurrent afib, Amio d/c'd in setting of hyperthyroidism.  Peripheral neuropathy    Pneumonia 12/2016   Rash and other nonspecific skin eruption 04/12/2009   no cause found saw dermatologists x 2 and allergist   SLEEP APNEA, OBSTRUCTIVE    a. doesn't use CPAP   Syncope    a. 03/2015 s/p MDT LINQ.   Type II diabetes mellitus ([place])        Past Surgical History:  Procedure Laterality Date   ACNE CYST REMOVAL     2 on back    AV NODE ABLATION N/A 10/25/2017   Procedure: AV NODE ABLATION;  Surgeon: Deboraha Sprang, MD;  Location: [place] [facility];  Service: Cardiovascular;  Laterality: N/A;   BIV ICD INSERTION CRT-D N/A 09/18/2017   Procedure: BIV ICD INSERTION CRT-D;  Surgeon: Deboraha Sprang, MD;  Location: [place] [facility];   Service: Cardiovascular;  Laterality: N/A;   CARDIAC CATHETERIZATION N/A 03/21/2016   Procedure: Right/Left Heart Cath and Coronary Angiography;  Surgeon: Larey Dresser, MD;  Location: [place] [facility];  Service: Cardiovascular;  Laterality: N/A;   CARDIOVERSION  04/18/2012   Procedure: CARDIOVERSION;  Surgeon: Fay Records, MD;  Location: [place];  Service: Cardiovascular;  Laterality: N/A;   CARDIOVERSION  04/25/2012   Procedure: CARDIOVERSION;  Surgeon: Thayer Headings, MD;  Location: [place];  Service: Cardiovascular;  Laterality: N/A;   CARDIOVERSION  04/25/2012   Procedure: CARDIOVERSION;  Surgeon: Fay Records, MD;  Location: [place];  Service: Cardiovascular;  Laterality: N/A;   CARDIOVERSION  05/09/2012   Procedure: CARDIOVERSION;  Surgeon: Sherren Mocha, MD;  Location: [place];  Service: Cardiovascular;  Laterality: N/A;  changed from crenshaw to cooper by trish/leone-endo   CARDIOVERSION N/A 03/15/2015   Procedure: CARDIOVERSION;  Surgeon: Thayer Headings, MD;  Location: [place];  Service: Cardiovascular;  Laterality: N/A;   COLONOSCOPY     COLONOSCOPY WITH PROPOFOL N/A 10/21/2014   Procedure: COLONOSCOPY WITH PROPOFOL;  Surgeon: Ladene Artist, MD;  Location: [facility];  Service: Endoscopy;  Laterality: N/A;   EP IMPLANTABLE DEVICE N/A 04/06/2015   Procedure: Loop Recorder Insertion;  Surgeon: Evans Lance, MD;  Location: [place] [facility];  Service: Cardiovascular;  Laterality: N/A;   ESOPHAGOGASTRODUODENOSCOPY     JOINT REPLACEMENT     LOOP RECORDER REMOVAL N/A 09/18/2017   Procedure: LOOP RECORDER REMOVAL;  Surgeon: Deboraha Sprang, MD;  Location: [place] [facility];  Service: Cardiovascular;  Laterality: N/A;   RIGHT/LEFT HEART CATH AND CORONARY ANGIOGRAPHY N/A 01/28/2017   Procedure: Right/Left Heart Cath and Coronary Angiography;  Surgeon: Larey Dresser, MD;  Location: [place] [facility];  Service: Cardiovascular;  Laterality: N/A;   TEE WITHOUT CARDIOVERSION  04/25/2012    Procedure: TRANSESOPHAGEAL ECHOCARDIOGRAM (TEE);  Surgeon: Thayer Headings, MD;  Location: [place];  Service: Cardiovascular;  Laterality: N/A;   TEE WITHOUT CARDIOVERSION N/A 03/15/2015   Procedure: TRANSESOPHAGEAL ECHOCARDIOGRAM (TEE);  Surgeon: Thayer Headings, MD;  Location: [place];  Service: Cardiovascular;  Laterality: N/A;   TONSILLECTOMY AND ADENOIDECTOMY     TOTAL KNEE ARTHROPLASTY Right 06/15/2014   Procedure: TOTAL KNEE ARTHROPLASTY;  Surgeon: Renette Butters, MD;  Location: [place];  Service: Orthopedics;  Laterality: Right;   Social History   Socioeconomic History   Marital status: Divorced    Spouse name: Not on file   Number of children: 2   Years of education: Not on file   Highest education level: Not on file  Occupational History  Occupation: retired/disabled. prev worked in Therapist, sports.    Employer: DISABLED  Tobacco Use   Smoking status: Former    Packs/day: 2.00    Years: 30.00    Pack years: 60.00    Types: Cigarettes    Quit date: 10/16/2007    Years since quitting: 13.8   Smokeless tobacco: Never  Vaping Use   Vaping Use: Never used  Substance and Sexual Activity   Alcohol use: No   Drug use: No   Sexual activity: Not Currently  Other Topics Concern   Not on file  Social History Narrative   Lives alone.   Social Determinants of Health   Financial Resource Strain: Low Risk    Difficulty of Paying Living Expenses: Not hard at all  Food Insecurity: No Food Insecurity   Worried About Charity fundraiser in the Last Year: Never true   [place] in the Last Year: Never true  Transportation Needs: No Transportation Needs   Lack of Transportation (Medical): No   Lack of Transportation (Non-Medical): No  Physical Activity: Inactive   Days of Exercise per Week: 0 days   Minutes of Exercise per Session: 0 min  Stress: No Stress Concern Present   Feeling of Stress : Not at all  Social Connections: Moderately Integrated   Frequency  of Communication with Friends and Family: More than three times a week   Frequency of Social Gatherings with Friends and Family: More than three times a week   Attends Religious Services: More than 4 times per year   Active Member of [employer] or Organizations: Yes   Attends Music therapist: More than 4 times per year   Marital Status: Never married   Allergies  Allergen Reactions   Amiodarone Other (See Comments)    hyperthyroidism   Statins Other (See Comments)    myalgia   Tape Other (See Comments)    Skin Tears Use Paper Tape Only   Family History  Problem Relation Age of Onset   COPD Mother    Asthma Mother    Colon polyps Mother    Allergies Mother    Hypothyroidism Mother    Asthma Maternal Grandmother    Colon cancer Neg Hx     Current Outpatient Medications (Endocrine & Metabolic):    dapagliflozin propanediol (FARXIGA) 10 MG TABS tablet, Take 1 tablet (10 mg total) by mouth daily before breakfast.   predniSONE (DELTASONE) 10 MG tablet, 3 tabs by mouth per day for 3 days,2tabs per day for 3 days,1tab per day for 3 days  Current Outpatient Medications (Cardiovascular):    carvedilol (COREG) 6.25 MG tablet, Take 1 tablet (6.25 mg total) by mouth 2 (two) times daily.   eplerenone (INSPRA) 25 MG tablet, Take 1 tablet (25 mg total) by mouth daily.   Evolocumab (REPATHA SURECLICK) 892 MG/ML SOAJ, INJECT 1 PEN INTO THE SKIN EVERY 14 (FOURTEEN) DAYS.   losartan (COZAAR) 25 MG tablet, Take 0.5 tablets (12.5 mg total) by mouth daily.   torsemide (DEMADEX) 20 MG tablet, Take 20 mg by mouth. Patient is taking 4 tablets in the morning and 3 tablets in the evening.   metolazone (ZAROXOLYN) 2.5 MG tablet, Take 1 tablet (2.5 mg total) by mouth as directed. With additional 40 meq of potassium  Current Outpatient Medications (Respiratory):    albuterol (VENTOLIN HFA) 108 (90 Base) MCG/ACT inhaler, INHALE 2 PUFFS 4 TIMES A DAY as needed   fluticasone (FLONASE) 50 MCG/ACT  nasal spray,  PLACE 2 SPRAYS INTO BOTH NOSTRILS DAILY AS NEEDED FOR ALLERGIES OR RHINITIS.  Current Outpatient Medications (Analgesics):    acetaminophen (TYLENOL) 500 MG tablet, Take 500 mg by mouth every 6 (six) hours as needed.   traMADol (ULTRAM) 50 MG tablet, TAKE 1 TABLET BY MOUTH DAILY AS NEEDED.  Current Outpatient Medications (Hematological):    apixaban (ELIQUIS) 5 MG TABS tablet, Take 1 tablet (5 mg total) by mouth 2 (two) times daily.  Current Outpatient Medications (Other):    ALPRAZolam (XANAX) 0.5 MG tablet, TAKE 2 TABLETS BY MOUTH AT BEDTIME   AMBULATORY NON FORMULARY MEDICATION, Rolling walker with a bench   augmented betamethasone dipropionate (DIPROLENE-AF) 0.05 % cream, Apply 1 application topically daily. As needed   carisoprodol (SOMA) 350 MG tablet, TAKE 1 TABLET BY MOUTH THREE TIMES A DAY AS NEEDED FOR MUSCLE SPASMS   Cholecalciferol 100 MCG (4000 UT) CAPS, 1 tab by mouth once daily   doxepin (SINEQUAN) 10 MG capsule, TAKE 2 CAPSULES (20 MG TOTAL) BY MOUTH AT BEDTIME AS NEEDED.   Dupilumab (DUPIXENT) 300 MG/2ML SOPN, Inject 1 Dose into the skin every 14 (fourteen) days.   hydrOXYzine (ATARAX/VISTARIL) 10 MG tablet, TAKE 1 TABLET BY MOUTH 3 TIMES DAILY AS NEEDED FOR ITCHING.   ketoconazole (NIZORAL) 2 % cream, APPLY 1 APPLICATION TOPICALLY DAILY   lidocaine (LIDODERM) 5 %, APPLY 1 PATCH TO AFFECTED AREA FOR UP TO 12 HOURS . DO NOT USE MORE THAN ONE PATCH IN 24 HOURS. (Patient taking differently: As needed)   omeprazole (PRILOSEC) 20 MG capsule, TAKE 1 CAPSULE BY MOUTH TWICE A DAY   polyethylene glycol-electrolytes (NULYTELY) 420 g solution,    potassium chloride SA (KLOR-CON) 20 MEQ tablet, Take 1 tablet (20 mEq total) by mouth daily.   tamsulosin (FLOMAX) 0.4 MG CAPS capsule, Take 0.4 mg by mouth daily.   traZODone (DESYREL) 100 MG tablet, 2 tab by mouth at bedtime as needed   triamcinolone (KENALOG) 0.1 % paste, Use as directed in the mouth or throat 2 (two) times  daily.   TUMS 500 MG chewable tablet, Chew 500-2,000 mg by mouth every 4 (four) hours as needed for indigestion or heartburn.    venlafaxine XR (EFFEXOR-XR) 75 MG 24 hr capsule, Take 1 capsule (75 mg total) by mouth daily with breakfast.   zolpidem (AMBIEN) 10 MG tablet, TAKE 1 TABLET BY MOUTH AT BEDTIME AS NEEDED   Reviewed prior external information including notes and imaging from  primary care provider As well as notes that were available from care everywhere and other healthcare systems.  Past medical history, social, surgical and family history all reviewed in electronic medical record.  No pertanent information unless stated regarding to the chief complaint.   Review of Systems:  No headache, visual changes, nausea, vomiting, diarrhea, constipation, dizziness, abdominal pain, skin rash, fevers, chills, night sweats, weight loss, swollen lymph nodes, body aches, joint swelling, chest pain, shortness of breath, mood changes. POSITIVE muscle aches  Objective  Blood pressure 102/72, pulse 73, height 5\' 7"  (1.702 m), weight 243 lb (110.2 kg), SpO2 97 %.   General: No apparent distress alert and oriented x3 mood and affect normal, dressed appropriately.  HEENT: Pupils equal, extraocular movements intact  Respiratory: Patient's speak in full sentences and does not appear short of breath  Cardiovascular: No lower extremity edema, non tender, no erythema  Gait antalgic  MSK: Patient does have knee arthritis noted of the left knee.  He does have instability with valgus and varus  force.  Patient does have trace effusion noted.  Bilateral shoulder exam shows the patient does have arthritic changes with atrophy noted of the shoulder girdle.  Limited range of motion especially with forward flexion bilaterally.  After informed written and verbal consent, patient was seated on exam table. Left knee was prepped with alcohol swab and utilizing anterolateral approach, patient's left knee space was  injected with 4:1  marcaine 0.5%: Kenalog 40mg /dL. Patient tolerated the procedure well without immediate complications.  Procedure: Real-time Ultrasound Guided Injection of right glenohumeral joint Device: GE Logiq Q7  Ultrasound guided injection is preferred based studies that show increased duration, increased effect, greater accuracy, decreased procedural pain, increased response rate with ultrasound guided versus blind injection.  Verbal informed consent obtained.  Time-out conducted.  Noted no overlying erythema, induration, or other signs of local infection.  Skin prepped in a sterile fashion.  Local anesthesia: Topical Ethyl chloride.  With sterile technique and under real time ultrasound guidance:  Joint visualized.  23g 1  inch needle inserted posterior approach. Pictures taken for needle placement. Patient did have injection of 2 cc of 1% lidocaine, 2 cc of 0.5% Marcaine, and 1.0 cc of Kenalog 40 mg/dL. Completed without difficulty  Pain immediately resolved suggesting accurate placement of the medication.  Advised to call if fevers/chills, erythema, induration, drainage, or persistent bleeding.  Impression: Technically successful ultrasound guided injection.  Procedure: Real-time Ultrasound Guided Injection of left glenohumeral joint Device: GE Logiq E  Ultrasound guided injection is preferred based studies that show increased duration, increased effect, greater accuracy, decreased procedural pain, increased response rate with ultrasound guided versus blind injection.  Verbal informed consent obtained.  Time-out conducted.  Noted no overlying erythema, induration, or other signs of local infection.  Skin prepped in a sterile fashion.  Local anesthesia: Topical Ethyl chloride.  With sterile technique and under real time ultrasound guidance:  Joint visualized.  21g 2 inch needle inserted posterior approach. Pictures taken for needle placement. Patient did have injection of 2 cc of  0.5% Marcaine, and 1cc of Kenalog 40 mg/dL. Completed without difficulty  Pain immediately resolved suggesting accurate placement of the medication.  Advised to call if fevers/chills, erythema, induration, drainage, or persistent bleeding.  Impression: Technically successful ultrasound guided injection.    Impression and Recommendations:     The above documentation has been reviewed and is accurate and complete Lyndal Pulley, DO

## 2021-08-22 ENCOUNTER — Other Ambulatory Visit: Payer: Self-pay

## 2021-08-22 ENCOUNTER — Ambulatory Visit: Payer: Medicare HMO | Admitting: Family Medicine

## 2021-08-22 ENCOUNTER — Encounter: Payer: Self-pay | Admitting: Family Medicine

## 2021-08-22 ENCOUNTER — Ambulatory Visit: Payer: Self-pay

## 2021-08-22 VITALS — BP 102/72 | HR 73 | Ht 67.0 in | Wt 243.0 lb

## 2021-08-22 DIAGNOSIS — M25512 Pain in left shoulder: Secondary | ICD-10-CM | POA: Diagnosis not present

## 2021-08-22 DIAGNOSIS — M12811 Other specific arthropathies, not elsewhere classified, right shoulder: Secondary | ICD-10-CM | POA: Diagnosis not present

## 2021-08-22 DIAGNOSIS — M25511 Pain in right shoulder: Secondary | ICD-10-CM

## 2021-08-22 DIAGNOSIS — G8929 Other chronic pain: Secondary | ICD-10-CM

## 2021-08-22 DIAGNOSIS — M1712 Unilateral primary osteoarthritis, left knee: Secondary | ICD-10-CM

## 2021-08-22 DIAGNOSIS — M12812 Other specific arthropathies, not elsewhere classified, left shoulder: Secondary | ICD-10-CM | POA: Diagnosis not present

## 2021-08-22 NOTE — Assessment & Plan Note (Signed)
Bilateral injections given again today.  Tolerated the procedure well.  Patient does have end-stage osteoarthritic changes especially of the left shoulder.  Patient has responded well to the injection.  Discussed with patient icing regimen and home exercises.  Increase activity as tolerated.  Follow-up again in 2 months

## 2021-08-22 NOTE — Assessment & Plan Note (Signed)
Repeat injection given again today.  Patient does need a replacement but secondary to patient's cardiovascular risk factors at the moment may need to hold for now.  Patient is also on a blood thinner which does make any other type of treatments difficult.  We will consider the possibility of viscosupplementation but does seem to be responding better to the steroid injections over the course of time.  Discussed which activities to do which wants to avoid.  Increase activity slowly.  Follow-up with me again in 2 months

## 2021-08-22 NOTE — Patient Instructions (Signed)
Happy Holidays See me again in 2 months

## 2021-09-04 ENCOUNTER — Ambulatory Visit (INDEPENDENT_AMBULATORY_CARE_PROVIDER_SITE_OTHER): Payer: Medicare HMO

## 2021-09-04 DIAGNOSIS — I5022 Chronic systolic (congestive) heart failure: Secondary | ICD-10-CM | POA: Diagnosis not present

## 2021-09-04 DIAGNOSIS — Z95810 Presence of automatic (implantable) cardiac defibrillator: Secondary | ICD-10-CM | POA: Diagnosis not present

## 2021-09-05 ENCOUNTER — Telehealth: Payer: Self-pay

## 2021-09-05 NOTE — Progress Notes (Signed)
EPIC Encounter for ICM Monitoring  Patient Name: [name] is a 65 y.o. male Date: 09/05/2021 Primary Care Physican: [name], MD Primary Cardiologist: [name] Electrophysiologist: Vergie Living Pacing: 99.8%    08/01/2021 Weight: 243 lbs     Attempted call to patient and unable to reach.  Left detailed message per DPR regarding transmission. Transmission reviewed.    Optivol thoracic impedance suggesting normal fluid levels.     Prescribed:  Torsemide 20 mg to 4 tablets (80 mg total) every morning and 3 tablets every afternoon.   Potassium 20 mEq take 1 tablet daily Spironolactone 25 mg take 1 tablet daily.   Labs: 06/09/2021 Creatinine 1.22, BUN 23, Potassium 3.7, Sodium 137, GFR >60 05/24/2021 Creatinine 0.95, BUN 14, Potassium 4.5, Sodium 135. GFR >60 05/17/2021 Creatinine 1.11, BUN 14, Potassium 4.0, Sodium 135, GFR >60 A complete set of results can be found in Results Review.   Recommendations:   Left voice mail with ICM number and encouraged to call if experiencing any fluid symptoms.   Follow-up plan: ICM clinic phone appointment on 10/17/2021.   91 day device clinic remote transmission 10/25/2021.   EP/Cardiology Office Visits:   09/15/2021 with Dr [name].  Recall 02/22/2021 with Dr [name].    Copy of ICM check sent to Dr. [name].    3 month ICM trend: 09/04/2021.    12-14 Month ICM trend:       [name], RN 09/05/2021 3:20 PM

## 2021-09-05 NOTE — Telephone Encounter (Signed)
Remote ICM transmission received.  Attempted call to patient regarding ICM remote transmission and left detailed message per DPR.  Advised to return call for any fluid symptoms or questions. Next ICM remote transmission scheduled 10/17/2021.

## 2021-09-09 ENCOUNTER — Other Ambulatory Visit: Payer: Self-pay | Admitting: Internal Medicine

## 2021-09-09 NOTE — Telephone Encounter (Signed)
Please refill as per office routine med refill policy (all routine meds to be refilled for 3 mo or monthly (per pt preference) up to one year from last visit, then month to month grace period for 3 mo, then further med refills will have to be denied) ? ?

## 2021-09-15 ENCOUNTER — Ambulatory Visit (HOSPITAL_COMMUNITY)
Admission: RE | Admit: 2021-09-15 | Discharge: 2021-09-15 | Disposition: A | Discharge status: Home / Self Care | Payer: Medicare HMO | Source: Ambulatory Visit | Attending: Cardiology | Admitting: Cardiology

## 2021-09-15 ENCOUNTER — Other Ambulatory Visit: Payer: Self-pay | Admitting: Internal Medicine

## 2021-09-15 VITALS — BP 100/60 | HR 71 | Wt 246.0 lb

## 2021-09-15 DIAGNOSIS — Z79899 Other long term (current) drug therapy: Secondary | ICD-10-CM | POA: Insufficient documentation

## 2021-09-15 DIAGNOSIS — I428 Other cardiomyopathies: Secondary | ICD-10-CM | POA: Insufficient documentation

## 2021-09-15 DIAGNOSIS — I5022 Chronic systolic (congestive) heart failure: Secondary | ICD-10-CM

## 2021-09-15 DIAGNOSIS — I2510 Atherosclerotic heart disease of native coronary artery without angina pectoris: Secondary | ICD-10-CM | POA: Diagnosis not present

## 2021-09-15 DIAGNOSIS — Z8616 Personal history of COVID-19: Secondary | ICD-10-CM | POA: Diagnosis not present

## 2021-09-15 DIAGNOSIS — I110 Hypertensive heart disease with heart failure: Secondary | ICD-10-CM | POA: Diagnosis not present

## 2021-09-15 DIAGNOSIS — I4821 Permanent atrial fibrillation: Secondary | ICD-10-CM | POA: Insufficient documentation

## 2021-09-15 DIAGNOSIS — Z87891 Personal history of nicotine dependence: Secondary | ICD-10-CM | POA: Diagnosis not present

## 2021-09-15 DIAGNOSIS — R7989 Other specified abnormal findings of blood chemistry: Secondary | ICD-10-CM | POA: Insufficient documentation

## 2021-09-15 DIAGNOSIS — Z9981 Dependence on supplemental oxygen: Secondary | ICD-10-CM | POA: Diagnosis not present

## 2021-09-15 DIAGNOSIS — J449 Chronic obstructive pulmonary disease, unspecified: Secondary | ICD-10-CM | POA: Diagnosis not present

## 2021-09-15 DIAGNOSIS — I442 Atrioventricular block, complete: Secondary | ICD-10-CM | POA: Insufficient documentation

## 2021-09-15 DIAGNOSIS — Z7901 Long term (current) use of anticoagulants: Secondary | ICD-10-CM | POA: Diagnosis not present

## 2021-09-15 DIAGNOSIS — I5032 Chronic diastolic (congestive) heart failure: Secondary | ICD-10-CM | POA: Diagnosis not present

## 2021-09-15 DIAGNOSIS — M1712 Unilateral primary osteoarthritis, left knee: Secondary | ICD-10-CM | POA: Diagnosis not present

## 2021-09-15 DIAGNOSIS — G4733 Obstructive sleep apnea (adult) (pediatric): Secondary | ICD-10-CM | POA: Diagnosis not present

## 2021-09-15 LAB — CBC
HCT: 39.4 % (ref 39.0–52.0)
Hemoglobin: 12.7 g/dL — ABNORMAL LOW (ref 13.0–17.0)
MCH: 29.8 pg (ref 26.0–34.0)
MCHC: 32.2 g/dL (ref 30.0–36.0)
MCV: 92.5 fL (ref 80.0–100.0)
Platelets: 199 10*3/uL (ref 150–400)
RBC: 4.26 MIL/uL (ref 4.22–5.81)
RDW: 17.9 % — ABNORMAL HIGH (ref 11.5–15.5)
WBC: 8 10*3/uL (ref 4.0–10.5)
nRBC: 0 % (ref 0.0–0.2)

## 2021-09-15 LAB — BASIC METABOLIC PANEL
Anion gap: 8 (ref 5–15)
BUN: 31 mg/dL — ABNORMAL HIGH (ref 8–23)
CO2: 21 mmol/L — ABNORMAL LOW (ref 22–32)
Calcium: 8.7 mg/dL — ABNORMAL LOW (ref 8.9–10.3)
Chloride: 107 mmol/L (ref 98–111)
Creatinine, Ser: 1.39 mg/dL — ABNORMAL HIGH (ref 0.61–1.24)
GFR, Estimated: 56 mL/min — ABNORMAL LOW (ref >60)
Glucose, Bld: 98 mg/dL (ref 70–99)
Potassium: 4.3 mmol/L (ref 3.5–5.1)
Sodium: 136 mmol/L (ref 135–145)

## 2021-09-15 LAB — BRAIN NATRIURETIC PEPTIDE: B Natriuretic Peptide: 47.6 pg/mL (ref 0.0–100.0)

## 2021-09-15 MED ORDER — EPLERENONE 50 MG PO TABS: 50.0000 mg | ORAL_TABLET | Freq: Every day | ORAL | 3 refills | Status: DC

## 2021-09-15 NOTE — Patient Instructions (Addendum)
Labs done today. We will contact you only if your labs are abnormal.  INCREASE Epleronone(Inspra) to 50mg  (1 tablet) by mouth daily.   No other medication changes were made. Please continue all current medications as prescribed.  Your physician recommends that you schedule a follow-up appointment in: 10 days for a lab only appointment and in 3 months with our NP/PA Clinic here in our office.   If you have any questions or concerns before your next appointment please send [place] a message through [facility] or call our office at [phone].    TO LEAVE A MESSAGE FOR THE NURSE SELECT OPTION 2, PLEASE LEAVE A MESSAGE INCLUDING: YOUR NAME DATE OF BIRTH CALL BACK NUMBER REASON FOR CALL**this is important as we prioritize the call backs  YOU WILL RECEIVE A CALL BACK THE SAME DAY AS LONG AS YOU CALL BEFORE 4:00 PM   Do the following things EVERYDAY: Weigh yourself in the morning before breakfast. Write it down and keep it in a log. Take your medicines as prescribed Eat low salt foods--Limit salt (sodium) to 2000 mg per day.  Stay as active as you can everyday Limit all fluids for the day to less than 2 liters   At the [facility], you and your health needs are our priority. As part of our continuing mission to provide you with exceptional heart care, we have created designated Provider Care Teams. These Care Teams include your primary Cardiologist (physician) and Advanced Practice Providers (APPs- Physician Assistants and Nurse Practitioners) who all work together to provide you with the care you need, when you need it.   You may see any of the following providers on your designated Care Team at your next follow up: Dr Glori Bickers Dr Haynes Kerns, NP Lyda Jester, [place] Audry Riles, PharmD   Please be sure to bring in all your medications bottles to every appointment.

## 2021-09-17 ENCOUNTER — Other Ambulatory Visit: Payer: Self-pay | Admitting: Internal Medicine

## 2021-09-17 NOTE — Progress Notes (Signed)
ID:  [name], DOB August 09, 1956, MRN [id number]  Provider location: [place] [facility] Type of Visit: Established patient  PCP:  [name], MD  Cardiologist:  Dr. [name]   History of Present Illness: [name] is a 65 y.o. male who has history of COPD on home oxygen, permanent atrial fibrillation, and chronic systolic CHF.  Amiodarone and DCCV were tried for atrial fibrillation in the past without success.   Patient has been noted to have a low EF since 2013.  EF improved by to normal by 2015 but dropped to 30-35% by 11/16.  Cath in 6/17 showed some CAD but not enough to cause cardiomyopathy.  He was admitted in 3/18 with PNA, atrial fibrillation/RVR, and acute on chronic systolic CHF.  Echo in 3/18 showed fall in EF to 10-15%.   I took him for right and left heart cath in 4/18, which showed nonobstructive CAD and relatively optimized filling pressures.    Syncope in 11/18. Loop recorder showed 6 second pause and periods of complete heart block. He had Medtronic BiV ICD placed in 12/18.  In 1/19, he had AV nodal ablation to promote BiV pacing.   Echo was done 6/19 with EF 25-30% with moderate RV dilation/mildly decreased systolic function. CPX 7/19 was submaximal, probably mild functional impairment.    Echo in 1/21 showed EF 35-40% with diffuse hypokinesis, moderate RV dilation with mildly decreased systolic function.   He was admitted with COVID-19 PNA in 2/19.  He had AKI/hypotension and Entresto was stopped.   Echo in 3/22 showed EF 40% with diffuse hypokinesis, mild RV enlargement, mildly decreased RV systolic function.   Presented to [facility] 7/22 with weakness and dizziness. Found to be in hemorrhagic shock from acute GIB. Hgb 5.0, INR 8.4. He was on Coumadin for atrial fibrillation. He was resuscitated with PRBCs, IVF, and given Vitamin K. His Entresto, beta blocker and torsemide were held due to hypotension. He initially required ICU monitoring due to  hypotension requiring pressors. GI team was consulted and planned to treat with oral antibiotics for suspected diverticular bleed with plans for outpatient colonoscopy.  Coumadin was transitioned to Eliquis.  He was discharged back on beta blocker, Entresto and SGLT2i. Dig, spiro and torsemide were held at discharge. Weight 259 lbs.  Today he returns for HF follow up. He walks with a cane due to knee pain.  He is short of breath after walking 100 feet.  No chest pain.  No lightheadedness.  No BRBPR/melena.  Plan for left TKR in 1/23.  Weight is up 3 lbs.   ICD interrogation (personally reviewed): No AF, thoracic impedence stable, >99% bi-v pacing, no VT/VF  Labs (4/18): pro-BNP 904, K 3.1, creatinine 1.39 => 1.08 with BUN 66 => 37, hgb 14.1.  Labs (5/18): hemoglobin 13.1 Labs (6/18): K 4, creatinine 1.3, BNP 294 Labs (7/18): K 3.8, creatinine 1.23, LDL 96, HDL 42 Labs (8/18): K 4.5, creatinine 1.22 Labs (10/18): digoxin 0.4 Labs (12/18): LDL 115, HDL 37, TGs 214, K 4.2, creatinine 0.84 Labs (1/19): K 4.4, creatinine 0.75, pro-BNP 433 Labs (2/19): digoxin 0.4 Labs (3/19): LDL 121, hgb 12.8, K 4.5, creatinine 0.98 Labs (6/19): LDL 48, HDL 45, K 5, creatinine 0.94, digoxin 0.3 Labs (9/19): K 3.7, creatinine 1.09 => 1.32, digoxin 0.2 Labs (11/19): LDL 115 Labs (12/20): K 3.9, creatinine 0.87, LDL 94  Labs (3/21): K 4, creatinine 1.08 Labs (6/21): LDL 109 Labs (7/21): K 3.7, creatinine 1.15 Labs (12/21): LDL 129,  TGs 239, hgb 12.9, K 3.9, creatinine 0.98, digoxin 0.8 Labs (1/22): ALT 76, AST 40, LDL 162, K 3.8 =>4.6, creatinine 1.16 => 1.12, digoxin 0.7, repeat LFTs normal Labs (5/22): LDL 72, TGs 335, K 4.2, creatinine 1.29 Labs (7/22): K 4.0, creatinine 0.77 Labs (8/22): K 4.0, creatinine 1.11 Labs (9/22): K 3.7, creatinine 1.14, hgb 11.7  PMH: 1. Asthma 2. COPD: On home oxygen. Prior smoker.  3. Atrial fibrillation: Permanent.  He failed DCCV and amiodarone in the past . 4. HTN 5.  GERD 6. Hyperlipidemia: Myalgias with atorvastatin 7. BPH.  8. Chronic systolic CHF: Nonischemic cardiomyopathy.  - EF 25% by TEE in 7/13.  Normalized on 2015 echo. - Echo (11/16): EF 30-35%.  - LHC/RHC (6/17): 80% ostial stenosis small D1, 30-40% pLAD.  Mean RA 4, PA 26/10, mean PCWP 11, CI 2.33.  - Echo (3/18): EF 10-15%, diffuse hypokinesis, severe LV dilation, moderate RV dilation with severely decreased systolic function.  - LHC/RHC (4/18): 40% proximal LAD.  Mean RA 8, PA 37/10 mean 22, mean PCWP 22, LVEDP 9, CI 2.39. - Medtronic BiV ICD placement in 12/18 with AV nodal ablation to promote BiV pacing in 1/19.  - Echo (6/19): EF 25-30% with mild LV dilation, moderately dilated RV with mildly decreased systolic function, IVC not dilated, PASP 23 mmHg.  - CPX (7/19): peak VO2 17.6 (78% predicted), VE/VCO2 slope 31, RER 1.02 => submaximal, probably mild functional impairment.  - Echo (1/21): EF 35-40% with diffuse hypokinesis, moderate RV dilation with mildly decreased systolic function. - Echo (3/22): EF 40% with diffuse hypokinesis, mild RV enlargement, mildly decreased RV systolic function.  9. Morbid obesity 10. ILR in place.  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 Complete heart block: s/p Medtronic CRT-D.  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 OSA: Untreated, unable to tolerate CPAP.  15. COVID-19 PNA 2/21.  16. Hyperlipidemia: Myalgias with multiple statins.  17. PAD: ABIs 10/21 noncompressible on right but TBI normal, normal ABI and TBI on left.  18. GI bleeding: 7/22, diverticulosis.   Social History   Socioeconomic History   Marital status: Divorced    Spouse name: Not on file   Number of children: 2   Years of education: Not on file   Highest education level: Not on file  Occupational History   Occupation: retired/disabled. prev worked in Therapist, sports.    Employer: DISABLED  Tobacco Use   Smoking status: Former    Packs/day: 2.00    Years: 30.00    Pack years:  60.00    Types: Cigarettes    Quit date: 10/16/2007    Years since quitting: 13.9   Smokeless tobacco: Never  Vaping Use   Vaping Use: Never used  Substance and Sexual Activity   Alcohol use: No   Drug use: No   Sexual activity: Not Currently  Other Topics Concern   Not on file  Social History Narrative   Lives alone.   Social Determinants of Health   Financial Resource Strain: Low Risk    Difficulty of Paying Living Expenses: Not hard at all  Food Insecurity: No Food Insecurity   Worried About Charity fundraiser in the Last Year: Never true   [place] in the Last Year: Never true  Transportation Needs: No Transportation Needs   Lack of Transportation (Medical): No   Lack of Transportation (Non-Medical): No  Physical Activity: Inactive   Days of Exercise per Week: 0 days   Minutes of Exercise per Session: 0 min  Stress: No Stress Concern Present   Feeling of Stress : Not at all  Social Connections: Moderately Integrated   Frequency of Communication with Friends and Family: More than three times a week   Frequency of Social Gatherings with Friends and Family: More than three times a week   Attends Religious Services: More than 4 times per year   Active Member of [employer] or Organizations: Yes   Attends Music therapist: More than 4 times per year   Marital Status: Never married  Human resources officer Violence: Not At Risk   Fear of Current or Ex-Partner: No   Emotionally Abused: No   Physically Abused: No   Sexually Abused: No   Family History  Problem Relation Age of Onset   COPD Mother    Asthma Mother    Colon polyps Mother    Allergies Mother    Hypothyroidism Mother    Asthma Maternal Grandmother    Colon cancer Neg Hx    ROS: All systems reviewed and negative except as per HPI.   Current Outpatient Medications  Medication Sig Dispense Refill   acetaminophen (TYLENOL) 500 MG tablet Take 500 mg by mouth every 6 (six) hours as needed.      albuterol (VENTOLIN HFA) 108 (90 Base) MCG/ACT inhaler INHALE 2 PUFFS BY MOUTH 4 TIMES A DAY AS NEEDED 54 each 3   ALPRAZolam (XANAX) 0.5 MG tablet TAKE 2 TABLETS BY MOUTH AT BEDTIME 60 tablet 2   AMBULATORY NON FORMULARY MEDICATION Rolling walker with a bench 1 Units 0   apixaban (ELIQUIS) 5 MG TABS tablet Take 1 tablet (5 mg total) by mouth 2 (two) times daily. 60 tablet 11   augmented betamethasone dipropionate (DIPROLENE-AF) 0.05 % cream Apply 1 application topically daily. As needed     carisoprodol (SOMA) 350 MG tablet TAKE 1 TABLET BY MOUTH THREE TIMES A DAY AS NEEDED FOR MUSCLE SPASMS 90 tablet 2   carvedilol (COREG) 6.25 MG tablet Take 1 tablet (6.25 mg total) by mouth 2 (two) times daily. 60 tablet 11   Cholecalciferol 100 MCG (4000 UT) CAPS 1 tab by mouth once daily 30 capsule 99   dapagliflozin propanediol (FARXIGA) 10 MG TABS tablet Take 1 tablet (10 mg total) by mouth daily before breakfast. 30 tablet 11   doxepin (SINEQUAN) 10 MG capsule TAKE 2 CAPSULES (20 MG TOTAL) BY MOUTH AT BEDTIME AS NEEDED. 180 capsule 1   Dupilumab (DUPIXENT) 300 MG/2ML SOPN Inject 1 Dose into the skin every 14 (fourteen) days.     Evolocumab (REPATHA SURECLICK) 694 MG/ML SOAJ INJECT 1 PEN INTO THE SKIN EVERY 14 (FOURTEEN) DAYS. 6 mL 3   fluticasone (FLONASE) 50 MCG/ACT nasal spray PLACE 2 SPRAYS INTO BOTH NOSTRILS DAILY AS NEEDED FOR ALLERGIES OR RHINITIS. 48 mL 1   hydrOXYzine (ATARAX/VISTARIL) 10 MG tablet TAKE 1 TABLET BY MOUTH 3 TIMES DAILY AS NEEDED FOR ITCHING. 60 tablet 2   ketoconazole (NIZORAL) 2 % cream APPLY 1 APPLICATION TOPICALLY DAILY 30 g 1   lidocaine (LIDODERM) 5 % APPLY 1 PATCH TO AFFECTED AREA FOR UP TO 12 HOURS . DO NOT USE MORE THAN ONE PATCH IN 24 HOURS. 90 patch 2   losartan (COZAAR) 25 MG tablet Take 0.5 tablets (12.5 mg total) by mouth daily. 45 tablet 3   metolazone (ZAROXOLYN) 2.5 MG tablet Take 1 tablet (2.5 mg total) by mouth as directed. With additional 40 meq of potassium 5  tablet 0   omeprazole (PRILOSEC) 20 MG capsule  TAKE 1 CAPSULE BY MOUTH TWICE A DAY 180 capsule 1   polyethylene glycol-electrolytes (NULYTELY) 420 g solution      potassium chloride SA (KLOR-CON) 20 MEQ tablet Take 1 tablet (20 mEq total) by mouth daily. 90 tablet 3   predniSONE (DELTASONE) 10 MG tablet 3 tabs by mouth per day for 3 days,2tabs per day for 3 days,1tab per day for 3 days 18 tablet 0   tamsulosin (FLOMAX) 0.4 MG CAPS capsule Take 0.4 mg by mouth daily.     torsemide (DEMADEX) 20 MG tablet Take 20 mg by mouth. Patient is taking 4 tablets in the morning and 3 tablets in the evening.     traZODone (DESYREL) 100 MG tablet 2 tab by mouth at bedtime as needed 180 tablet 1   triamcinolone (KENALOG) 0.1 % paste Use as directed in the mouth or throat 2 (two) times daily. 5 g 2   TUMS 500 MG chewable tablet Chew 500-2,000 mg by mouth every 4 (four) hours as needed for indigestion or heartburn.      venlafaxine XR (EFFEXOR-XR) 75 MG 24 hr capsule Take 1 capsule (75 mg total) by mouth daily with breakfast. 90 capsule 1   zolpidem (AMBIEN) 10 MG tablet TAKE 1 TABLET BY MOUTH EVERY DAY AT BEDTIME AS NEEDED 90 tablet 1   eplerenone (INSPRA) 50 MG tablet Take 1 tablet (50 mg total) by mouth daily. 90 tablet 3   traMADol (ULTRAM) 50 MG tablet TAKE 1 TABLET BY MOUTH EVERY DAY AS NEEDED 30 tablet 5   No current facility-administered medications for this encounter.   Wt Readings from Last 3 Encounters:  09/15/21 111.6 kg (246 lb)  08/22/21 110.2 kg (243 lb)  07/21/21 110.2 kg (243 lb)   BP 100/60   Pulse 71   Wt 111.6 kg (246 lb)   SpO2 97%   BMI 38.53 kg/m  General: NAD Neck: No JVD, no thyromegaly or thyroid nodule.  Lungs: Clear to auscultation bilaterally with normal respiratory effort. CV: Nondisplaced PMI.  Heart regular S1/S2, no S3/S4, no murmur.  1+ ankle edema.  No carotid bruit.  Normal pedal pulses.  Abdomen: Soft, nontender, no hepatosplenomegaly, no distention.  Skin: Intact  without lesions or rashes.  Neurologic: Alert and oriented x 3.  Psych: Normal affect. Extremities: No clubbing or cyanosis.  HEENT: Normal.   Assessment/Plan:  1. Atrial fibrillation: Permanent.  Now s/p AV nodal ablation to allow effective BiV pacing.  - Continue Eliquis. No overt bleeding.  2. COPD: No longer smoking.  Not using oxygen.  3. Chronic systolic CHF: Nonischemic cardiomyopathy based on 6/17 cath. However, he had a recent significant fall in EF from 30-35% to 10-15% on echo from 3/18.  LHC/RHC in 4/18 showed nonobstructive CAD and relatively optimized filling pressures.  He now has Medtronic CRT-D device with AV nodal ablation to allow BiV pacing.  Echo 6/19 with EF 25-30% with mildly decreased RV systolic function. CPX 7/19 was submaximal but likely only mild functional limitation from CHF.  Echo in 1/21 showed EF 35-40%, echo in 3/22 showed EF up to 40% with mildly decreased RV systolic function.  NYHA class II, functional class confounded by body habitus, knee OA and physical deconditioning. He is not overloaded on exam or by Optivol. - Continue Coreg 6.25 mg bid. - Continue torsemide 80 mg qam/60 qpm.  - Increase eplerenone to 50 mg daily (off spiro with gynecomastia).  BMET/BNP today and again in 10 days.  - Continue losartan 12.5  mg qhs (did not tolerate low dose Entresto with dizziness and low BPs). - Continue Farxiga 10 mg daily. 4. CAD: Nonobstructive on 4/18 cath.  No exertional chest pain.  - No ASA with Eliquis use. - Statin myalgias, continue Repatha. Good LDL in 5/22.  5. Complete heart block: Now has Medtronic CRT-D device and s/p AV nodal ablation.  6. OSA: Unable to tolerate CPAP.   7. Elevated LFTs: Most recent LFTs back to normal.   8. Left knee OA: Needs TKR.  He is stable from a cardiac standpoint.  I think that he could have TKR with moderate but not unacceptable risk of complications. This will take place in 1/23.  - Avoid NSAIDs as much as possible.    Followup in 3 months with APP.  Signed, Loralie Champagne, MD  09/17/2021  [place] [address] [place] [address] [phone] (office) [phone] (fax)

## 2021-09-18 ENCOUNTER — Telehealth (HOSPITAL_COMMUNITY): Payer: Self-pay

## 2021-09-18 ENCOUNTER — Telehealth: Payer: Self-pay

## 2021-09-18 ENCOUNTER — Ambulatory Visit: Payer: Medicare HMO | Admitting: Internal Medicine

## 2021-09-18 NOTE — Telephone Encounter (Signed)
Spoke with patient.  He reports he will be having knee surgery on 1/3 and will need to reschedule monthly ICM remote transmission.  Advised will reschedule for 10/23/2021.  He will be staying with a friend after surgery and advised to take monitor with him to his friends place for recovery.

## 2021-09-18 NOTE — Telephone Encounter (Signed)
-----   Message from Larey Dresser, MD sent at 09/17/2021 10:50 PM EST ----- Hold pm torsemide for 1 day then decrease torsemide to 80 qam/40 qpm.  BMET 1 week.

## 2021-09-21 ENCOUNTER — Encounter: Payer: Self-pay | Admitting: Internal Medicine

## 2021-09-21 ENCOUNTER — Other Ambulatory Visit: Payer: Self-pay

## 2021-09-21 ENCOUNTER — Ambulatory Visit (INDEPENDENT_AMBULATORY_CARE_PROVIDER_SITE_OTHER): Payer: Medicare HMO | Admitting: Internal Medicine

## 2021-09-21 VITALS — BP 102/60 | HR 74 | Temp 98.1°F | Ht 67.0 in | Wt 246.0 lb

## 2021-09-21 DIAGNOSIS — Z01818 Encounter for other preprocedural examination: Secondary | ICD-10-CM | POA: Diagnosis not present

## 2021-09-21 DIAGNOSIS — Z86010 Personal history of colonic polyps: Secondary | ICD-10-CM | POA: Diagnosis not present

## 2021-09-21 DIAGNOSIS — E7800 Pure hypercholesterolemia, unspecified: Secondary | ICD-10-CM | POA: Diagnosis not present

## 2021-09-21 DIAGNOSIS — E1165 Type 2 diabetes mellitus with hyperglycemia: Secondary | ICD-10-CM | POA: Diagnosis not present

## 2021-09-21 LAB — POCT GLYCOSYLATED HEMOGLOBIN (HGB A1C): Hemoglobin A1C: 5.3 % (ref 4.0–5.6)

## 2021-09-21 NOTE — Patient Instructions (Signed)
Your A1c was done today  Please continue all other medications as before, and refills have been done if requested.  Please have the pharmacy call with any other refills you may need.  Please continue your efforts at being more active, low cholesterol diet, and weight control.  You are otherwise up to date with prevention measures today.  Please keep your appointments with your specialists as you may have planned  You will be contacted regarding the referral for: GI at [facility]  Please make an Appointment to return in 6 months, or sooner if needed  Emmit Alexanders with your surgury on Jan 3!

## 2021-09-21 NOTE — Progress Notes (Signed)
Patient ID: [name], male   DOB: 01-Apr-1956, 65 y.o.   MRN: [id number]        Chief Complaint: follow up HTN, HLD and hyperglycemia, hx of colon polyps, preop for left knee TKR jan 3       HPI:  [name] is a 65 y.o. male here overall doing ok, but has hx of colon polyps; has been informed he is to be referred to [place] for next colonoscopy but has not heard about this, asks for referral here.  Pt denies chest pain, increased sob or doe, wheezing, orthopnea, PND, increased LE swelling, palpitations, dizziness or syncope.   Pt denies polydipsia, polyuria, or new focal neuro s/s.   Due for left knee TKR on Jan 3.    No new complaints       Wt Readings from Last 3 Encounters:  09/21/21 246 lb (111.6 kg)  09/15/21 246 lb (111.6 kg)  08/22/21 243 lb (110.2 kg)   BP Readings from Last 3 Encounters:  09/21/21 102/60  09/15/21 100/60  08/22/21 102/72         Past Medical History:  Diagnosis Date   AICD (automatic cardioverter/defibrillator) present    Anemia    supposed to be taking Vit B but doesn't   ANXIETY    takes Xanax nightly   Arthritis    Asthma    Albuterol prn and Advair daily;also takes Prednisone daily   Atrial fibrillation ([place]) 09/06/2015   Cardiomyopathy ([place])    a. EF 25% TEE July 2013; b. EF normalized 2015;  c. 03/2015 Echo: EF 40-45%, difrf HK, PASP 38 mmHg, Mild MR, sev LAE/RAE.   CHF (congestive heart failure) (HCC)    Chronic constipation    takes OTC stool softener   COPD (chronic obstructive pulmonary disease) (HCC)    "one dr says COPD; one dr says emphysema" (09/18/2017)   DEPRESSION    takes Zoloft and Doxepin daily   Diverticulitis    DYSKINESIA, ESOPHAGUS    Emphysema of lung ([place])    "one dr says COPD; one dr says emphysema" (09/18/2017)   Essential hypertension        FIBROMYALGIA    GERD (gastroesophageal reflux disease)        Glaucoma    HYPERLIPIDEMIA    a. Intolerant to statins.   INSOMNIA    takes Ambien nightly   Myocardial  infarction [facility])    a. 2012 Myoview notable for prior infarct;  b. 03/2015 Lexiscan CL: EF 37%, diff HK, small area of inferior infarct from apex to base-->Med Rx.   Myocardial infarction ([place])    O2 dependent    "2.5L q hs & prn" (09/18/2017)   Paroxysmal atrial fibrillation ([place])    a. CHA2DS2VASc = 3--> takes Coumadin;  b. 03/15/2015 Successful TEE/DCCV;  c. 03/2015 recurrent afib, Amio d/c'd in setting of hyperthyroidism.   Peripheral neuropathy    Pneumonia 12/2016   Rash and other nonspecific skin eruption 04/12/2009   no cause found saw dermatologists x 2 and allergist   SLEEP APNEA, OBSTRUCTIVE    a. doesn't use CPAP   Syncope    a. 03/2015 s/p MDT LINQ.   Type II diabetes mellitus ([place])        Past Surgical History:  Procedure Laterality Date   ACNE CYST REMOVAL     2 on back    AV NODE ABLATION N/A 10/25/2017   Procedure: AV NODE ABLATION;  Surgeon: Deboraha Sprang, MD;  Location: [place] [facility];  Service: Cardiovascular;  Laterality: N/A;   BIV ICD INSERTION CRT-D N/A 09/18/2017   Procedure: BIV ICD INSERTION CRT-D;  Surgeon: Deboraha Sprang, MD;  Location: [place] [facility];  Service: Cardiovascular;  Laterality: N/A;   CARDIAC CATHETERIZATION N/A 03/21/2016   Procedure: Right/Left Heart Cath and Coronary Angiography;  Surgeon: Larey Dresser, MD;  Location: [place] [facility];  Service: Cardiovascular;  Laterality: N/A;   CARDIOVERSION  04/18/2012   Procedure: CARDIOVERSION;  Surgeon: Fay Records, MD;  Location: [place];  Service: Cardiovascular;  Laterality: N/A;   CARDIOVERSION  04/25/2012   Procedure: CARDIOVERSION;  Surgeon: Thayer Headings, MD;  Location: [place];  Service: Cardiovascular;  Laterality: N/A;   CARDIOVERSION  04/25/2012   Procedure: CARDIOVERSION;  Surgeon: Fay Records, MD;  Location: [place];  Service: Cardiovascular;  Laterality: N/A;   CARDIOVERSION  05/09/2012   Procedure: CARDIOVERSION;  Surgeon: Sherren Mocha, MD;  Location: [place];  Service:  Cardiovascular;  Laterality: N/A;  changed from crenshaw to cooper by trish/leone-endo   CARDIOVERSION N/A 03/15/2015   Procedure: CARDIOVERSION;  Surgeon: Thayer Headings, MD;  Location: [place];  Service: Cardiovascular;  Laterality: N/A;   COLONOSCOPY     COLONOSCOPY WITH PROPOFOL N/A 10/21/2014   Procedure: COLONOSCOPY WITH PROPOFOL;  Surgeon: Ladene Artist, MD;  Location: [facility];  Service: Endoscopy;  Laterality: N/A;   EP IMPLANTABLE DEVICE N/A 04/06/2015   Procedure: Loop Recorder Insertion;  Surgeon: Evans Lance, MD;  Location: [place] [facility];  Service: Cardiovascular;  Laterality: N/A;   ESOPHAGOGASTRODUODENOSCOPY     JOINT REPLACEMENT     LOOP RECORDER REMOVAL N/A 09/18/2017   Procedure: LOOP RECORDER REMOVAL;  Surgeon: Deboraha Sprang, MD;  Location: [place] [facility];  Service: Cardiovascular;  Laterality: N/A;   RIGHT/LEFT HEART CATH AND CORONARY ANGIOGRAPHY N/A 01/28/2017   Procedure: Right/Left Heart Cath and Coronary Angiography;  Surgeon: Larey Dresser, MD;  Location: [place] [facility];  Service: Cardiovascular;  Laterality: N/A;   TEE WITHOUT CARDIOVERSION  04/25/2012   Procedure: TRANSESOPHAGEAL ECHOCARDIOGRAM (TEE);  Surgeon: Thayer Headings, MD;  Location: [place];  Service: Cardiovascular;  Laterality: N/A;   TEE WITHOUT CARDIOVERSION N/A 03/15/2015   Procedure: TRANSESOPHAGEAL ECHOCARDIOGRAM (TEE);  Surgeon: Thayer Headings, MD;  Location: [place];  Service: Cardiovascular;  Laterality: N/A;   TONSILLECTOMY AND ADENOIDECTOMY     TOTAL KNEE ARTHROPLASTY Right 06/15/2014   Procedure: TOTAL KNEE ARTHROPLASTY;  Surgeon: Renette Butters, MD;  Location: [place];  Service: Orthopedics;  Laterality: Right;    reports that he quit smoking about 13 years ago. His smoking use included cigarettes. He has a 60.00 pack-year smoking history. He has never used smokeless tobacco. He reports that he does not drink alcohol and does not use drugs. family history  includes Allergies in his mother; Asthma in his maternal grandmother and mother; COPD in his mother; Colon polyps in his mother; Hypothyroidism in his mother. Allergies  Allergen Reactions   Amiodarone Other (See Comments)    hyperthyroidism   Statins Other (See Comments)    myalgia   Tape Other (See Comments)    Skin Tears Use Paper Tape Only   Current Outpatient Medications on File Prior to Visit  Medication Sig Dispense Refill   acetaminophen (TYLENOL) 500 MG tablet Take 500 mg by mouth every 6 (six) hours as needed.     albuterol (VENTOLIN HFA) 108 (90  Base) MCG/ACT inhaler INHALE 2 PUFFS BY MOUTH 4 TIMES A DAY AS NEEDED 54 each 3   ALPRAZolam (XANAX) 0.5 MG tablet TAKE 2 TABLETS BY MOUTH AT BEDTIME 60 tablet 2   AMBULATORY NON FORMULARY MEDICATION Rolling walker with a bench 1 Units 0   apixaban (ELIQUIS) 5 MG TABS tablet Take 1 tablet (5 mg total) by mouth 2 (two) times daily. 60 tablet 11   augmented betamethasone dipropionate (DIPROLENE-AF) 0.05 % cream Apply 1 application topically daily. As needed     carisoprodol (SOMA) 350 MG tablet TAKE 1 TABLET BY MOUTH THREE TIMES A DAY AS NEEDED FOR MUSCLE SPASMS 90 tablet 2   carvedilol (COREG) 6.25 MG tablet Take 1 tablet (6.25 mg total) by mouth 2 (two) times daily. 60 tablet 11   Cholecalciferol 100 MCG (4000 UT) CAPS 1 tab by mouth once daily 30 capsule 99   dapagliflozin propanediol (FARXIGA) 10 MG TABS tablet Take 1 tablet (10 mg total) by mouth daily before breakfast. 30 tablet 11   doxepin (SINEQUAN) 10 MG capsule TAKE 2 CAPSULES (20 MG TOTAL) BY MOUTH AT BEDTIME AS NEEDED. 180 capsule 1   Dupilumab (DUPIXENT) 300 MG/2ML SOPN Inject 1 Dose into the skin every 14 (fourteen) days.     eplerenone (INSPRA) 50 MG tablet Take 1 tablet (50 mg total) by mouth daily. 90 tablet 3   Evolocumab (REPATHA SURECLICK) 035 MG/ML SOAJ INJECT 1 PEN INTO THE SKIN EVERY 14 (FOURTEEN) DAYS. 6 mL 3   fluticasone (FLONASE) 50 MCG/ACT nasal spray PLACE 2  SPRAYS INTO BOTH NOSTRILS DAILY AS NEEDED FOR ALLERGIES OR RHINITIS. 48 mL 1   hydrOXYzine (ATARAX/VISTARIL) 10 MG tablet TAKE 1 TABLET BY MOUTH 3 TIMES DAILY AS NEEDED FOR ITCHING. 60 tablet 2   ketoconazole (NIZORAL) 2 % cream APPLY 1 APPLICATION TOPICALLY DAILY 30 g 1   lidocaine (LIDODERM) 5 % APPLY 1 PATCH TO AFFECTED AREA FOR UP TO 12 HOURS . DO NOT USE MORE THAN ONE PATCH IN 24 HOURS. 90 patch 2   omeprazole (PRILOSEC) 20 MG capsule TAKE 1 CAPSULE BY MOUTH TWICE A DAY 180 capsule 1   polyethylene glycol-electrolytes (NULYTELY) 420 g solution      potassium chloride SA (KLOR-CON) 20 MEQ tablet Take 1 tablet (20 mEq total) by mouth daily. 90 tablet 3   predniSONE (DELTASONE) 10 MG tablet 3 tabs by mouth per day for 3 days,2tabs per day for 3 days,1tab per day for 3 days 18 tablet 0   tamsulosin (FLOMAX) 0.4 MG CAPS capsule Take 0.4 mg by mouth daily.     torsemide (DEMADEX) 20 MG tablet Take 20 mg by mouth. Patient is taking 4 tablets in the morning and 3 tablets in the evening.     traMADol (ULTRAM) 50 MG tablet TAKE 1 TABLET BY MOUTH EVERY DAY AS NEEDED 30 tablet 5   traZODone (DESYREL) 100 MG tablet 2 tab by mouth at bedtime as needed 180 tablet 1   triamcinolone (KENALOG) 0.1 % paste Use as directed in the mouth or throat 2 (two) times daily. 5 g 2   TUMS 500 MG chewable tablet Chew 500-2,000 mg by mouth every 4 (four) hours as needed for indigestion or heartburn.      venlafaxine XR (EFFEXOR-XR) 75 MG 24 hr capsule Take 1 capsule (75 mg total) by mouth daily with breakfast. 90 capsule 1   zolpidem (AMBIEN) 10 MG tablet TAKE 1 TABLET BY MOUTH EVERY DAY AT BEDTIME AS NEEDED 90 tablet  1   losartan (COZAAR) 25 MG tablet Take 0.5 tablets (12.5 mg total) by mouth daily. 45 tablet 3   metolazone (ZAROXOLYN) 2.5 MG tablet Take 1 tablet (2.5 mg total) by mouth as directed. With additional 40 meq of potassium 5 tablet 0   No current facility-administered medications on file prior to visit.         ROS:  All others reviewed and negative.  Objective        PE:  BP 102/60 (BP Location: Right Arm, Patient Position: Sitting, Cuff Size: Large)   Pulse 74   Temp 98.1 F (36.7 C) (Oral)   Ht 5\' 7"  (1.702 m)   Wt 246 lb (111.6 kg)   SpO2 97%   BMI 38.53 kg/m                 Constitutional: Pt appears in NAD               HENT: Head: NCAT.                Right Ear: External ear normal.                 Left Ear: External ear normal.                Eyes: . Pupils are equal, round, and reactive to light. Conjunctivae and EOM are normal               Nose: without d/c or deformity               Neck: Neck supple. Gross normal ROM               Cardiovascular: Normal rate and regular rhythm.                 Pulmonary/Chest: Effort normal and breath sounds without rales or wheezing.                Abd:  Soft, NT, ND, + BS, no organomegaly               Neurological: Pt is alert. At baseline orientation, motor grossly intact               Skin: Skin is warm. No rashes, no other new lesions, LE edema - none               Psychiatric: Pt behavior is normal without agitation   Micro: none  Cardiac tracings I have personally interpreted today:  none  Pertinent Radiological findings (summarize): none   Lab Results  Component Value Date   WBC 8.0 09/15/2021   HGB 12.7 (L) 09/15/2021   HCT 39.4 09/15/2021   PLT 199 09/15/2021   GLUCOSE 98 09/25/2021   CHOL 153 02/28/2021   TRIG 335.0 (H) 02/28/2021   HDL 33.60 (L) 02/28/2021   LDLDIRECT 72.0 02/28/2021   LDLCALC 162 (H) 10/20/2020   ALT 15 05/05/2021   AST 17 05/05/2021   NA 138 09/25/2021   K 4.8 09/25/2021   CL 107 09/25/2021   CREATININE 1.13 09/25/2021   BUN 20 09/25/2021   CO2 24 09/25/2021   TSH 1.46 02/28/2021   PSA 2.26 02/28/2021   INR 1.1 05/08/2021   HGBA1C 5.3 09/21/2021   MICROALBUR 0.8 02/28/2021   Hemoglobin A1C 4.0 - 5.6 % 5.3  5.5 R, CM  5.5 R, CM  5.6 R, CM  6.1 R, CM  5.2 R, CM  5.5 R  Assessment/Plan:  [name] is a 65 y.o. White or Caucasian [1] male with  has a past medical history of AICD (automatic cardioverter/defibrillator) present, Anemia, ANXIETY, Arthritis, Asthma, Atrial fibrillation ([place]) (09/06/2015), Cardiomyopathy ([place]), CHF (congestive heart failure) ([place]), Chronic constipation, COPD (chronic obstructive pulmonary disease) ([place]), DEPRESSION, Diverticulitis, DYSKINESIA, ESOPHAGUS, Emphysema of lung ([place]), Essential hypertension, FIBROMYALGIA, GERD (gastroesophageal reflux disease), Glaucoma, HYPERLIPIDEMIA, INSOMNIA, Myocardial infarction [facility]), Myocardial infarction ([place]), O2 dependent, Paroxysmal atrial fibrillation ([place]), Peripheral neuropathy, Pneumonia (12/2016), Rash and other nonspecific skin eruption (04/12/2009), SLEEP APNEA, OBSTRUCTIVE, Syncope, and Type II diabetes mellitus ([place]).  Hyperlipidemia Lab Results  Component Value Date   LDLCALC 162 (H) 10/20/2020  uncontrolled, pt to continue current repatha, declines f/u lipid panel today    Type 2 diabetes mellitus ([place]) Lab Results  Component Value Date   HGBA1C 5.3 09/21/2021   Stable, pt to continue current medical treatment farxiga   History of colonic polyps Ok for referral GI at [facility] for f/u colonscopy  Preop exam for internal medicine Stable, ok for surgury jan 3 for left knee TKR  Followup: Return in about 6 months (around 03/22/2022).  [name], MD 09/26/2021 4:15 AM [facility]

## 2021-09-25 ENCOUNTER — Other Ambulatory Visit: Payer: Self-pay

## 2021-09-25 ENCOUNTER — Ambulatory Visit (HOSPITAL_COMMUNITY)
Admission: RE | Admit: 2021-09-25 | Discharge: 2021-09-25 | Disposition: A | Discharge status: Home / Self Care | Payer: Medicare HMO | Source: Ambulatory Visit | Attending: Cardiology | Admitting: Cardiology

## 2021-09-25 DIAGNOSIS — I5032 Chronic diastolic (congestive) heart failure: Secondary | ICD-10-CM

## 2021-09-25 LAB — BASIC METABOLIC PANEL
Anion gap: 7 (ref 5–15)
BUN: 20 mg/dL (ref 8–23)
CO2: 24 mmol/L (ref 22–32)
Calcium: 9 mg/dL (ref 8.9–10.3)
Chloride: 107 mmol/L (ref 98–111)
Creatinine, Ser: 1.13 mg/dL (ref 0.61–1.24)
GFR, Estimated: >60 mL/min (ref >60)
Glucose, Bld: 98 mg/dL (ref 70–99)
Potassium: 4.8 mmol/L (ref 3.5–5.1)
Sodium: 138 mmol/L (ref 135–145)

## 2021-09-26 ENCOUNTER — Encounter: Payer: Self-pay | Admitting: Internal Medicine

## 2021-09-26 ENCOUNTER — Ambulatory Visit: Payer: Medicare HMO | Admitting: Podiatry

## 2021-09-26 DIAGNOSIS — B351 Tinea unguium: Secondary | ICD-10-CM | POA: Diagnosis not present

## 2021-09-26 DIAGNOSIS — Z860100 Personal history of colon polyps, unspecified: Secondary | ICD-10-CM | POA: Insufficient documentation

## 2021-09-26 DIAGNOSIS — E119 Type 2 diabetes mellitus without complications: Secondary | ICD-10-CM

## 2021-09-26 DIAGNOSIS — M79675 Pain in left toe(s): Secondary | ICD-10-CM

## 2021-09-26 DIAGNOSIS — M79674 Pain in right toe(s): Secondary | ICD-10-CM | POA: Diagnosis not present

## 2021-09-26 NOTE — Assessment & Plan Note (Signed)
Lab Results  Component Value Date   HGBA1C 5.3 09/21/2021   Stable, pt to continue current medical treatment farxiga

## 2021-09-26 NOTE — Assessment & Plan Note (Signed)
Stable, ok for surgury jan 3 for left knee TKR

## 2021-09-26 NOTE — Assessment & Plan Note (Signed)
[place] for referral GI at [facility] for f/u colonscopy

## 2021-09-26 NOTE — Assessment & Plan Note (Signed)
Lab Results  Component Value Date   LDLCALC 162 (H) 10/20/2020  uncontrolled, pt to continue current repatha, declines f/u lipid panel today

## 2021-09-27 NOTE — Progress Notes (Signed)
°  Subjective:  Patient ID: [name], male    DOB: [birth year],  MRN: [id number]  No chief complaint on file.   65 y.o. male returns with the above complaint. History confirmed with patient.  Knee surgery is planned for January.  Nails are thickened elongated and causing pain again.  Objective:  Physical Exam: warm, good capillary refill, no trophic changes or ulcerative lesions, normal sensory exam, +2 DP and PT reduced bilateral and venous stasis dermatitis noted.  Moderate to severe hallux abductovalgus with hammertoe contractures 2 through 5 bilaterally, right worse than left.  Summary:  Right: Resting right ankle-brachial index indicates noncompressible right  lower extremity arteries. The right toe-brachial index is normal. ABIs are  unreliable.   Left: Resting left ankle-brachial index is within normal range. No  evidence of significant left lower extremity arterial disease. The left  toe-brachial index is normal.       *See table(s) above for measurements and observations.         Radiographs: X-ray of the right foot: Moderate hallux abductovalgus with the presence of metatarsus adductus deformity and pes planus Assessment:   1. Pain due to onychomycosis of toenails of both feet   2. Type 2 diabetes mellitus without complication, without long-term current use of insulin ([place])      Plan:  Patient was evaluated and treated and all questions answered.  Patient educated on diabetes. Discussed proper diabetic foot care and discussed risks and complications of disease. Educated patient in depth on reasons to return to the office immediately should he/she discover anything concerning or new on the feet. All questions answered. Discussed proper shoes as well.  Discussed the etiology and treatment options for the condition in detail with the patient. Educated patient on the topical and oral treatment options for mycotic nails. Recommended debridement of the nails today.  Sharp and mechanical debridement performed of all painful and mycotic nails today. Nails debrided in length and thickness using a nail nipper to level of comfort. Discussed treatment options including appropriate shoe gear. Follow up as needed for painful nails.  Ingrowing corners of hallux nails debrided and slant back fashion  Is having his left knee replacement in January.  He would like to plan for surgery on the bunions for next year.  We will plan this after his next visit  Return in about 9 weeks (around 11/28/2021) for at risk diabetic foot care.

## 2021-09-28 ENCOUNTER — Other Ambulatory Visit: Payer: Self-pay | Admitting: Physician Assistant

## 2021-09-28 DIAGNOSIS — M1712 Unilateral primary osteoarthritis, left knee: Secondary | ICD-10-CM

## 2021-10-02 ENCOUNTER — Other Ambulatory Visit (HOSPITAL_COMMUNITY): Payer: Self-pay | Admitting: Cardiology

## 2021-10-04 ENCOUNTER — Other Ambulatory Visit (HOSPITAL_COMMUNITY): Payer: Self-pay | Admitting: *Deleted

## 2021-10-04 NOTE — Progress Notes (Signed)
Surgical Instructions    Your procedure is scheduled on 10/17/21.  Report to [facility] Main Entrance "A" at 8:00 A.M., then check in with the Admitting office.  Call this number if you have problems the morning of surgery:  [phone]   If you have any questions prior to your surgery date call [phone]: Open Monday-Friday 8am-4pm    Remember:  Do not eat after midnight the night before your surgery  You may drink clear liquids until 7:00 the morning of your surgery.   Clear liquids allowed are: Water, Non-Citrus Juices (without pulp), Carbonated Beverages, Clear Tea, Black Coffee ONLY (NO MILK, CREAM OR POWDERED CREAMER of any kind), and Gatorade  Please complete your PRE-SURGERY GATORADE that was provided to you by 8:00 the morning of surgery.  Please, if able, drink it in one setting. DO NOT SIP.     Take these medicines the morning of surgery with A SIP OF WATER:  carvedilol (COREG)  omeprazole (PRILOSEC) tamsulosin (FLOMAX)  venlafaxine XR (EFFEXOR-XR)  AS NEEDED: acetaminophen (TYLENOL) albuterol (VENTOLIN HFA)  carisoprodol (SOMA) fluticasone (FLONASE) hydrOXYzine (ATARAX/VISTARIL)   Follow your surgeon's instructions on when to stop Eliquis.  If no instructions were given by your surgeon then you will need to call the office to get those instructions.      As of today, STOP taking any Aspirin (unless otherwise instructed by your surgeon) Aleve, Naproxen, Ibuprofen, Motrin, Advil, Goody's, BC's, all herbal medications, fish oil, and all vitamins.  WHAT DO I DO ABOUT MY DIABETES MEDICATION?    DO NOT TAKE FARXIGA the day before surgery or the day of surgery.   HOW TO MANAGE YOUR DIABETES BEFORE AND AFTER SURGERY  Why is it important to control my blood sugar before and after surgery? Improving blood sugar levels before and after surgery helps healing and can limit problems. A way of improving blood sugar control is eating a healthy diet by:  Eating less  sugar and carbohydrates  Increasing activity/exercise  Talking with your doctor about reaching your blood sugar goals High blood sugars (greater than 180 mg/dL) can raise your risk of infections and slow your recovery, so you will need to focus on controlling your diabetes during the weeks before surgery. Make sure that the doctor who takes care of your diabetes knows about your planned surgery including the date and location.  How do I manage my blood sugar before surgery? Check your blood sugar at least 4 times a day, starting 2 days before surgery, to make sure that the level is not too high or low.  Check your blood sugar the morning of your surgery when you wake up and every 2 hours until you get to the Short Stay unit.  If your blood sugar is less than 70 mg/dL, you will need to treat for low blood sugar: Do not take insulin. Treat a low blood sugar (less than 70 mg/dL) with  cup of clear juice (cranberry or apple), 4 glucose tablets, OR glucose gel. Recheck blood sugar in 15 minutes after treatment (to make sure it is greater than 70 mg/dL). If your blood sugar is not greater than 70 mg/dL on recheck, call [phone] for further instructions. Report your blood sugar to the short stay nurse when you get to Short Stay.  If you are admitted to the hospital after surgery: Your blood sugar will be checked by the staff and you will probably be given insulin after surgery (instead of oral diabetes medicines) to make sure  you have good blood sugar levels. The goal for blood sugar control after surgery is 80-180 mg/dL.     After your COVID test   You are not required to quarantine however you are required to wear a well-fitting mask when you are out and around people not in your household.  If your mask becomes wet or soiled, replace with a new one.  Wash your hands often with soap and water for 20 seconds or clean your hands with an alcohol-based hand sanitizer that contains at least 60%  alcohol.  Do not share personal items.  Notify your provider: if you are in close contact with someone who has COVID  or if you develop a fever of 100.4 or greater, sneezing, cough, sore throat, shortness of breath or body aches.           Do not wear jewelry  Do not wear lotions, powders, colognes, or deodorant. Do not shave 48 hours prior to surgery.  Men may shave face and neck. Do not bring valuables to the hospital.              [facility] is not responsible for any belongings or valuables.  Do NOT Smoke (Tobacco/Vaping)  24 hours prior to your procedure  If you use a CPAP at night, you may bring your mask for your overnight stay.   Contacts, glasses, hearing aids, dentures or partials may not be worn into surgery, please bring cases for these belongings   For patients admitted to the hospital, discharge time will be determined by your treatment team.   Patients discharged the day of surgery will not be allowed to drive home, and someone needs to stay with them for 24 hours.  NO VISITORS WILL BE ALLOWED IN PRE-OP WHERE PATIENTS ARE PREPPED FOR SURGERY.  ONLY 1 SUPPORT PERSON MAY BE PRESENT IN THE WAITING ROOM WHILE YOU ARE IN SURGERY.  IF YOU ARE TO BE ADMITTED, ONCE YOU ARE IN YOUR ROOM YOU WILL BE ALLOWED TWO (2) VISITORS. 1 (ONE) VISITOR MAY STAY OVERNIGHT BUT MUST ARRIVE TO THE ROOM BY 8pm.  Minor children may have two parents present. Special consideration for safety and communication needs will be reviewed on a case by case basis.  Special instructions:    Oral Hygiene is also important to reduce your risk of infection.  Remember - BRUSH YOUR TEETH THE MORNING OF SURGERY WITH YOUR REGULAR TOOTHPASTE   [place]- Preparing For Surgery  Before surgery, you can play an important role. Because skin is not sterile, your skin needs to be as free of germs as possible. You can reduce the number of germs on your skin by washing with CHG (chlorahexidine gluconate) Soap before  surgery.  CHG is an antiseptic cleaner which kills germs and bonds with the skin to continue killing germs even after washing.     Please do not use if you have an allergy to CHG or antibacterial soaps. If your skin becomes reddened/irritated stop using the CHG.  Do not shave (including legs and underarms) for at least 48 hours prior to first CHG shower. It is OK to shave your face.  Please follow these instructions carefully.     Shower the NIGHT BEFORE SURGERY and the MORNING OF SURGERY with CHG Soap.   If you chose to wash your hair, wash your hair first as usual with your normal shampoo. After you shampoo, rinse your hair and body thoroughly to remove the shampoo.  Then [employer]  and genitals (private parts) with your normal soap and rinse thoroughly to remove soap.  After that Use CHG Soap as you would any other liquid soap. You can apply CHG directly to the skin and wash gently with a scrungie or a clean washcloth.   Apply the CHG Soap to your body ONLY FROM THE NECK DOWN.  Do not use on open wounds or open sores. Avoid contact with your eyes, ears, mouth and genitals (private parts). Wash Face and genitals (private parts)  with your normal soap.   Wash thoroughly, paying special attention to the area where your surgery will be performed.  Thoroughly rinse your body with warm water from the neck down.  DO NOT shower/wash with your normal soap after using and rinsing off the CHG Soap.  Pat yourself dry with a CLEAN TOWEL.  Wear CLEAN PAJAMAS to bed the night before surgery  Place CLEAN SHEETS on your bed the night before your surgery  DO NOT SLEEP WITH PETS.   Day of Surgery:  Take a shower with CHG soap. Wear Clean/Comfortable clothing the morning of surgery Do not apply any deodorants/lotions.   Remember to brush your teeth WITH YOUR REGULAR TOOTHPASTE.   Please read over the following fact sheets that you were given.

## 2021-10-05 ENCOUNTER — Other Ambulatory Visit: Payer: Self-pay

## 2021-10-05 ENCOUNTER — Encounter (HOSPITAL_COMMUNITY): Payer: Self-pay

## 2021-10-05 ENCOUNTER — Encounter (HOSPITAL_COMMUNITY)
Admission: RE | Admit: 2021-10-05 | Discharge: 2021-10-05 | Disposition: A | Discharge status: Home / Self Care | Payer: Medicare HMO | Source: Ambulatory Visit | Attending: Orthopaedic Surgery | Admitting: Orthopaedic Surgery

## 2021-10-05 VITALS — BP 122/80 | HR 73 | Temp 97.8°F | Resp 18 | Ht 70.0 in | Wt 251.9 lb

## 2021-10-05 DIAGNOSIS — G4733 Obstructive sleep apnea (adult) (pediatric): Secondary | ICD-10-CM | POA: Insufficient documentation

## 2021-10-05 DIAGNOSIS — J449 Chronic obstructive pulmonary disease, unspecified: Secondary | ICD-10-CM | POA: Insufficient documentation

## 2021-10-05 DIAGNOSIS — M1712 Unilateral primary osteoarthritis, left knee: Secondary | ICD-10-CM | POA: Insufficient documentation

## 2021-10-05 DIAGNOSIS — Z9981 Dependence on supplemental oxygen: Secondary | ICD-10-CM | POA: Diagnosis not present

## 2021-10-05 DIAGNOSIS — I4821 Permanent atrial fibrillation: Secondary | ICD-10-CM | POA: Insufficient documentation

## 2021-10-05 DIAGNOSIS — Z95810 Presence of automatic (implantable) cardiac defibrillator: Secondary | ICD-10-CM | POA: Insufficient documentation

## 2021-10-05 DIAGNOSIS — E119 Type 2 diabetes mellitus without complications: Secondary | ICD-10-CM | POA: Insufficient documentation

## 2021-10-05 DIAGNOSIS — Z01812 Encounter for preprocedural laboratory examination: Secondary | ICD-10-CM | POA: Insufficient documentation

## 2021-10-05 DIAGNOSIS — I428 Other cardiomyopathies: Secondary | ICD-10-CM | POA: Insufficient documentation

## 2021-10-05 DIAGNOSIS — Z7901 Long term (current) use of anticoagulants: Secondary | ICD-10-CM | POA: Insufficient documentation

## 2021-10-05 DIAGNOSIS — Z01818 Encounter for other preprocedural examination: Secondary | ICD-10-CM

## 2021-10-05 HISTORY — DX: Cardiac arrhythmia, unspecified: I49.9

## 2021-10-05 LAB — CBC
HCT: 39.9 % (ref 39.0–52.0)
Hemoglobin: 12.8 g/dL — ABNORMAL LOW (ref 13.0–17.0)
MCH: 31 pg (ref 26.0–34.0)
MCHC: 32.1 g/dL (ref 30.0–36.0)
MCV: 96.6 fL (ref 80.0–100.0)
Platelets: 192 10*3/uL (ref 150–400)
RBC: 4.13 MIL/uL — ABNORMAL LOW (ref 4.22–5.81)
RDW: 16.8 % — ABNORMAL HIGH (ref 11.5–15.5)
WBC: 6.5 10*3/uL (ref 4.0–10.5)
nRBC: 0 % (ref 0.0–0.2)

## 2021-10-05 LAB — TYPE AND SCREEN
ABO/RH(D): A POS
Antibody Screen: NEGATIVE

## 2021-10-05 LAB — SURGICAL PCR SCREEN
MRSA, PCR: NEGATIVE
Staphylococcus aureus: NEGATIVE

## 2021-10-05 LAB — GLUCOSE, CAPILLARY: Glucose-Capillary: 98 mg/dL (ref 70–99)

## 2021-10-05 NOTE — Progress Notes (Addendum)
PCP - [name], MD Cardiologist - Loralie Champagne, MD  PPM/ICD - yes Device Orders - no Rep Notified - yes  Chest x-ray - 05/05/2021 EKG - 05/17/2021 Stress Test - 07/02/2018 ECHO - 12/20/2020 Cardiac Cath - 01/28/2017  Sleep Study - yes - OSA CPAP - no  Fasting Blood Sugar - patient is not checking CBG at home CBG today - 98 A1C 5.3 - 09/21/2021  Blood Thinner Instructions: Eliquis - last dose - 10/11/2021  Aspirin Instructions: Patient was instructed: As of today, STOP taking any Aspirin (unless otherwise instructed by your surgeon) Aleve, Naproxen, Ibuprofen, Motrin, Advil, Goody's, BC's, all herbal medications, fish oil, and all vitamins.  ERAS Protcol - yes PRE-SURGERY G2- yes  COVID TEST- no, the test will be done on 10/13/2021 @ 13:50; patient verbalized understanding   Anesthesia review: yes - pacemaker; cardiac history  Patient denies shortness of breath, fever, cough and chest pain at PAT appointment   All instructions explained to the patient, with a verbal understanding of the material. Patient agrees to go over the instructions while at home for a better understanding. Patient also instructed to self quarantine after being tested for COVID-19. The opportunity to ask questions was provided.

## 2021-10-06 ENCOUNTER — Ambulatory Visit: Payer: Medicare HMO | Admitting: Student

## 2021-10-06 ENCOUNTER — Encounter: Payer: Self-pay | Admitting: Student

## 2021-10-06 ENCOUNTER — Other Ambulatory Visit: Payer: Self-pay

## 2021-10-06 VITALS — BP 100/70 | HR 70 | Ht 70.0 in | Wt 249.2 lb

## 2021-10-06 DIAGNOSIS — I4821 Permanent atrial fibrillation: Secondary | ICD-10-CM | POA: Diagnosis not present

## 2021-10-06 DIAGNOSIS — J449 Chronic obstructive pulmonary disease, unspecified: Secondary | ICD-10-CM

## 2021-10-06 DIAGNOSIS — I5022 Chronic systolic (congestive) heart failure: Secondary | ICD-10-CM | POA: Diagnosis not present

## 2021-10-06 DIAGNOSIS — Z95810 Presence of automatic (implantable) cardiac defibrillator: Secondary | ICD-10-CM | POA: Diagnosis not present

## 2021-10-06 LAB — CUP PACEART INCLINIC DEVICE CHECK
Battery Remaining Longevity: 29 mo
Battery Voltage: 2.94 V
Brady Statistic AP VP Percent: 0 %
Brady Statistic AP VS Percent: 0 %
Brady Statistic AS VP Percent: 0 %
Brady Statistic AS VS Percent: 0 %
Brady Statistic RA Percent Paced: 0 %
Brady Statistic RV Percent Paced: 99.94 %
Date Time Interrogation Session: 20221223105959
HighPow Impedance: 77 Ohm
Implantable Lead Implant Date: 20181205
Implantable Lead Implant Date: 20181205
Implantable Lead Location: 753858
Implantable Lead Location: 753860
Implantable Lead Model: 4398
Implantable Pulse Generator Implant Date: 20181205
Lead Channel Impedance Value: 1064 Ohm
Lead Channel Impedance Value: 1083 Ohm
Lead Channel Impedance Value: 1121 Ohm
Lead Channel Impedance Value: 1121 Ohm
Lead Channel Impedance Value: 232.653
Lead Channel Impedance Value: 279.484
Lead Channel Impedance Value: 285 Ohm
Lead Channel Impedance Value: 286.508
Lead Channel Impedance Value: 292.308
Lead Channel Impedance Value: 361 Ohm
Lead Channel Impedance Value: 4047 Ohm
Lead Channel Impedance Value: 418 Ohm
Lead Channel Impedance Value: 456 Ohm
Lead Channel Impedance Value: 475 Ohm
Lead Channel Impedance Value: 722 Ohm
Lead Channel Impedance Value: 760 Ohm
Lead Channel Impedance Value: 836 Ohm
Lead Channel Impedance Value: 893 Ohm
Lead Channel Pacing Threshold Amplitude: 0.375 V
Lead Channel Pacing Threshold Pulse Width: 0.4 ms
Lead Channel Sensing Intrinsic Amplitude: 12.875 mV
Lead Channel Sensing Intrinsic Amplitude: 12.875 mV
Lead Channel Setting Pacing Amplitude: 2.25 V
Lead Channel Setting Pacing Amplitude: 2.5 V
Lead Channel Setting Pacing Pulse Width: 0.4 ms
Lead Channel Setting Pacing Pulse Width: 0.6 ms
Lead Channel Setting Sensing Sensitivity: 0.3 mV

## 2021-10-06 NOTE — Progress Notes (Signed)
Electrophysiology Office Note Date: 10/06/2021  ID:  [name], [name] 01-22-1956, MRN [id number]  PCP: [name], MD Primary Cardiologist: None Electrophysiologist: Virl Axe, MD   CC: Routine ICD follow-up  [name] is a 65 y.o. male seen today for Virl Axe, MD for routine electrophysiology followup.  Since last being seen in our clinic the patient reports doing OK from a cardiac perspective. Biggest issue is left knee pain, for which surgery is scheduled. CHF managed by ICM and Dr. [name]. Feels 3-4 weight gain over past week. Thoracic impedence trending down but optivol not going up yet.   Walks with cane. SOB after ~ 100 feet. Denies chest pain or lightheadedness. No bleeding on Eliquis.  Device History: Medtronic BiV ICD implanted 09/2017 for NICM  Past Medical History:  Diagnosis Date   AICD (automatic cardioverter/defibrillator) present    Anemia    supposed to be taking Vit B but doesn't   ANXIETY    takes Xanax nightly   Arthritis    Asthma    Albuterol prn and Advair daily;also takes Prednisone daily   Atrial fibrillation ([place]) 09/06/2015   Cardiomyopathy ([place])    a. EF 25% TEE July 2013; b. EF normalized 2015;  c. 03/2015 Echo: EF 40-45%, difrf HK, PASP 38 mmHg, Mild MR, sev LAE/RAE.   CHF (congestive heart failure) (HCC)    Chronic constipation    takes OTC stool softener   COPD (chronic obstructive pulmonary disease) (HCC)    "one dr says COPD; one dr says emphysema" (09/18/2017)   DEPRESSION    takes Zoloft and Doxepin daily   Diverticulitis    DYSKINESIA, ESOPHAGUS    Dysrhythmia    Atrial Fibrilation   Emphysema of lung [facility])    "one dr says COPD; one dr says emphysema" (09/18/2017)   Essential hypertension        FIBROMYALGIA    GERD (gastroesophageal reflux disease)        Glaucoma    HYPERLIPIDEMIA    a. Intolerant to statins.   INSOMNIA    takes Ambien nightly   Myocardial infarction [facility])    a. 2012 Myoview notable for  prior infarct;  b. 03/2015 Lexiscan CL: EF 37%, diff HK, small area of inferior infarct from apex to base-->Med Rx.   Myocardial infarction ([place])    O2 dependent    "2.5L q hs & prn" (09/18/2017)   Paroxysmal atrial fibrillation ([place])    a. CHA2DS2VASc = 3--> takes Coumadin;  b. 03/15/2015 Successful TEE/DCCV;  c. 03/2015 recurrent afib, Amio d/c'd in setting of hyperthyroidism.   Peripheral neuropathy    Pneumonia 12/2016   Rash and other nonspecific skin eruption 04/12/2009   no cause found saw dermatologists x 2 and allergist   SLEEP APNEA, OBSTRUCTIVE    a. doesn't use CPAP   Syncope    a. 03/2015 s/p MDT LINQ.   Type II diabetes mellitus ([place])        Past Surgical History:  Procedure Laterality Date   ACNE CYST REMOVAL     2 on back    AV NODE ABLATION N/A 10/25/2017   Procedure: AV NODE ABLATION;  Surgeon: Deboraha Sprang, MD;  Location: [place] [facility];  Service: Cardiovascular;  Laterality: N/A;   BIV ICD INSERTION CRT-D N/A 09/18/2017   Procedure: BIV ICD INSERTION CRT-D;  Surgeon: Deboraha Sprang, MD;  Location: [place] [facility];  Service: Cardiovascular;  Laterality: N/A;   CARDIAC CATHETERIZATION  N/A 03/21/2016   Procedure: Right/Left Heart Cath and Coronary Angiography;  Surgeon: Larey Dresser, MD;  Location: [place] [facility];  Service: Cardiovascular;  Laterality: N/A;   CARDIOVERSION  04/18/2012   Procedure: CARDIOVERSION;  Surgeon: Fay Records, MD;  Location: [place];  Service: Cardiovascular;  Laterality: N/A;   CARDIOVERSION  04/25/2012   Procedure: CARDIOVERSION;  Surgeon: Thayer Headings, MD;  Location: [place];  Service: Cardiovascular;  Laterality: N/A;   CARDIOVERSION  04/25/2012   Procedure: CARDIOVERSION;  Surgeon: Fay Records, MD;  Location: [place];  Service: Cardiovascular;  Laterality: N/A;   CARDIOVERSION  05/09/2012   Procedure: CARDIOVERSION;  Surgeon: Sherren Mocha, MD;  Location: [place];  Service: Cardiovascular;  Laterality: N/A;  changed from  crenshaw to cooper by trish/leone-endo   CARDIOVERSION N/A 03/15/2015   Procedure: CARDIOVERSION;  Surgeon: Thayer Headings, MD;  Location: [place];  Service: Cardiovascular;  Laterality: N/A;   COLONOSCOPY     COLONOSCOPY WITH PROPOFOL N/A 10/21/2014   Procedure: COLONOSCOPY WITH PROPOFOL;  Surgeon: Ladene Artist, MD;  Location: [facility];  Service: Endoscopy;  Laterality: N/A;   EP IMPLANTABLE DEVICE N/A 04/06/2015   Procedure: Loop Recorder Insertion;  Surgeon: Evans Lance, MD;  Location: [place] [facility];  Service: Cardiovascular;  Laterality: N/A;   ESOPHAGOGASTRODUODENOSCOPY     JOINT REPLACEMENT     LOOP RECORDER REMOVAL N/A 09/18/2017   Procedure: LOOP RECORDER REMOVAL;  Surgeon: Deboraha Sprang, MD;  Location: [place] [facility];  Service: Cardiovascular;  Laterality: N/A;   RIGHT/LEFT HEART CATH AND CORONARY ANGIOGRAPHY N/A 01/28/2017   Procedure: Right/Left Heart Cath and Coronary Angiography;  Surgeon: Larey Dresser, MD;  Location: [place] [facility];  Service: Cardiovascular;  Laterality: N/A;   TEE WITHOUT CARDIOVERSION  04/25/2012   Procedure: TRANSESOPHAGEAL ECHOCARDIOGRAM (TEE);  Surgeon: Thayer Headings, MD;  Location: [place];  Service: Cardiovascular;  Laterality: N/A;   TEE WITHOUT CARDIOVERSION N/A 03/15/2015   Procedure: TRANSESOPHAGEAL ECHOCARDIOGRAM (TEE);  Surgeon: Thayer Headings, MD;  Location: [place];  Service: Cardiovascular;  Laterality: N/A;   TONSILLECTOMY AND ADENOIDECTOMY     TOTAL KNEE ARTHROPLASTY Right 06/15/2014   Procedure: TOTAL KNEE ARTHROPLASTY;  Surgeon: Renette Butters, MD;  Location: [place];  Service: Orthopedics;  Laterality: Right;    Current Outpatient Medications  Medication Sig Dispense Refill   acetaminophen (TYLENOL) 500 MG tablet Take 500 mg by mouth every 6 (six) hours as needed for moderate pain.     albuterol (VENTOLIN HFA) 108 (90 Base) MCG/ACT inhaler INHALE 2 PUFFS BY MOUTH 4 TIMES A DAY AS NEEDED 54 each 3    ALPRAZolam (XANAX) 0.5 MG tablet TAKE 2 TABLETS BY MOUTH AT BEDTIME 60 tablet 2   AMBULATORY NON FORMULARY MEDICATION Rolling walker with a bench 1 Units 0   apixaban (ELIQUIS) 5 MG TABS tablet Take 1 tablet (5 mg total) by mouth 2 (two) times daily. 60 tablet 11   augmented betamethasone dipropionate (DIPROLENE-AF) 0.05 % cream Apply 1 application topically daily as needed (itching).     carisoprodol (SOMA) 350 MG tablet TAKE 1 TABLET BY MOUTH THREE TIMES A DAY AS NEEDED FOR MUSCLE SPASMS 90 tablet 2   carvedilol (COREG) 6.25 MG tablet Take 1 tablet (6.25 mg total) by mouth 2 (two) times daily. 60 tablet 11   Cholecalciferol 100 MCG (4000 UT) CAPS 1 tab by mouth once daily 30 capsule 99   dapagliflozin propanediol (FARXIGA)  10 MG TABS tablet Take 1 tablet (10 mg total) by mouth daily before breakfast. 30 tablet 11   doxepin (SINEQUAN) 10 MG capsule TAKE 2 CAPSULES (20 MG TOTAL) BY MOUTH AT BEDTIME AS NEEDED. 180 capsule 1   Dupilumab (DUPIXENT) 300 MG/2ML SOPN Inject 300 mg into the skin every 14 (fourteen) days.     eplerenone (INSPRA) 50 MG tablet Take 1 tablet (50 mg total) by mouth daily. 90 tablet 3   Evolocumab (REPATHA SURECLICK) 973 MG/ML SOAJ INJECT 1 PEN INTO THE SKIN EVERY 14 (FOURTEEN) DAYS. 6 mL 3   fluticasone (FLONASE) 50 MCG/ACT nasal spray PLACE 2 SPRAYS INTO BOTH NOSTRILS DAILY AS NEEDED FOR ALLERGIES OR RHINITIS. 48 mL 1   hydrOXYzine (ATARAX/VISTARIL) 10 MG tablet TAKE 1 TABLET BY MOUTH 3 TIMES DAILY AS NEEDED FOR ITCHING. 60 tablet 2   ketoconazole (NIZORAL) 2 % cream APPLY 1 APPLICATION TOPICALLY DAILY (Patient taking differently: Apply 1 application topically daily as needed (dry skin).) 30 g 1   KLOR-CON M20 20 MEQ tablet TAKE 1 TABLET BY MOUTH EVERY DAY (Patient taking differently: Take 40 mEq by mouth daily.) 90 tablet 3   lidocaine (LIDODERM) 5 % APPLY 1 PATCH TO AFFECTED AREA FOR UP TO 12 HOURS . DO NOT USE MORE THAN ONE PATCH IN 24 HOURS. 90 patch 2   losartan (COZAAR)  25 MG tablet Take 0.5 tablets (12.5 mg total) by mouth daily. 45 tablet 3   metolazone (ZAROXOLYN) 2.5 MG tablet Take 1 tablet (2.5 mg total) by mouth as directed. With additional 40 meq of potassium (Patient taking differently: Take 2.5 mg by mouth daily. With additional 40 meq of potassium) 5 tablet 0   omeprazole (PRILOSEC) 20 MG capsule TAKE 1 CAPSULE BY MOUTH TWICE A DAY 180 capsule 1   predniSONE (DELTASONE) 10 MG tablet 3 tabs by mouth per day for 3 days,2tabs per day for 3 days,1tab per day for 3 days (Patient not taking: Reported on 10/04/2021) 18 tablet 0   tamsulosin (FLOMAX) 0.4 MG CAPS capsule Take 0.4 mg by mouth daily.     torsemide (DEMADEX) 20 MG tablet Take 60-80 mg by mouth See admin instructions. Take 80 mg by mouth in the morning and 60 mg in the evening.     traMADol (ULTRAM) 50 MG tablet TAKE 1 TABLET BY MOUTH EVERY DAY AS NEEDED 30 tablet 5   traZODone (DESYREL) 100 MG tablet 2 tab by mouth at bedtime as needed 180 tablet 1   triamcinolone (KENALOG) 0.1 % paste Use as directed in the mouth or throat 2 (two) times daily. 5 g 2   TUMS 500 MG chewable tablet Chew 500-2,000 mg by mouth every 4 (four) hours as needed for indigestion or heartburn.      venlafaxine XR (EFFEXOR-XR) 75 MG 24 hr capsule Take 1 capsule (75 mg total) by mouth daily with breakfast. 90 capsule 1   zolpidem (AMBIEN) 10 MG tablet TAKE 1 TABLET BY MOUTH EVERY DAY AT BEDTIME AS NEEDED 90 tablet 1   No current facility-administered medications for this visit.    Allergies:   Amiodarone, Statins, and Tape   Social History: Social History   Socioeconomic History   Marital status: Divorced    Spouse name: Not on file   Number of children: 2   Years of education: Not on file   Highest education level: Not on file  Occupational History   Occupation: retired/disabled. prev worked in Therapist, sports.    Employer: DISABLED  Tobacco Use   Smoking status: Former    Packs/day: 2.00    Years: 30.00     Pack years: 60.00    Types: Cigarettes    Quit date: 10/16/2007    Years since quitting: 13.9   Smokeless tobacco: Never  Vaping Use   Vaping Use: Never used  Substance and Sexual Activity   Alcohol use: No   Drug use: No   Sexual activity: Not Currently  Other Topics Concern   Not on file  Social History Narrative   Lives alone.   Social Determinants of Health   Financial Resource Strain: Low Risk    Difficulty of Paying Living Expenses: Not hard at all  Food Insecurity: No Food Insecurity   Worried About Charity fundraiser in the Last Year: Never true   [place] in the Last Year: Never true  Transportation Needs: No Transportation Needs   Lack of Transportation (Medical): No   Lack of Transportation (Non-Medical): No  Physical Activity: Inactive   Days of Exercise per Week: 0 days   Minutes of Exercise per Session: 0 min  Stress: No Stress Concern Present   Feeling of Stress : Not at all  Social Connections: Moderately Integrated   Frequency of Communication with Friends and Family: More than three times a week   Frequency of Social Gatherings with Friends and Family: More than three times a week   Attends Religious Services: More than 4 times per year   Active Member of [employer] or Organizations: Yes   Attends Music therapist: More than 4 times per year   Marital Status: Never married  Human resources officer Violence: Not At Risk   Fear of Current or Ex-Partner: No   Emotionally Abused: No   Physically Abused: No   Sexually Abused: No    Family History: Family History  Problem Relation Age of Onset   COPD Mother    Asthma Mother    Colon polyps Mother    Allergies Mother    Hypothyroidism Mother    Asthma Maternal Grandmother    Colon cancer Neg Hx     Review of Systems: All other systems reviewed and are otherwise negative except as noted above.   Physical Exam: Vitals:   10/06/21 1040  BP: 100/70  Pulse: 70  SpO2: 96%  Weight: 249 lb  3.2 oz (113 kg)  Height: 5\' 10"  (1.778 m)     GEN- The patient is well appearing, alert and oriented x 3 today.   HEENT: normocephalic, atraumatic; sclera clear, conjunctiva pink; hearing intact; oropharynx clear; neck supple, no JVP Lymph- no cervical lymphadenopathy Lungs- Clear to ausculation bilaterally, normal work of breathing.  No wheezes, rales, rhonchi Heart- Regular rate and rhythm, no murmurs, rubs or gallops, PMI not laterally displaced GI- soft, non-tender, non-distended, bowel sounds present, no hepatosplenomegaly Extremities- no clubbing or cyanosis. No edema; DP/PT/radial pulses 2+ bilaterally MS- no significant deformity or atrophy Skin- warm and dry, no rash or lesion; ICD pocket well healed Psych- euthymic mood, full affect Neuro- strength and sensation are intact  ICD interrogation- reviewed in detail today,  See PACEART report  EKG:  EKG is not ordered today. Personal review of EKG ordered  05/17/2021  shows V pacing at 77 bpm  Recent Labs: 02/28/2021: TSH 1.46 05/05/2021: ALT 15 05/10/2021: Magnesium 2.0 09/15/2021: B Natriuretic Peptide 47.6 09/25/2021: BUN 20; Creatinine, Ser 1.13; Potassium 4.8; Sodium 138 10/05/2021: Hemoglobin 12.8; Platelets 192   Wt Readings from  Last 3 Encounters:  10/05/21 251 lb 14.4 oz (114.3 kg)  09/21/21 246 lb (111.6 kg)  09/15/21 246 lb (111.6 kg)     Other studies Reviewed: Additional studies/ records that were reviewed today include: Previous EP office notes.   Assessment and Plan:  1.  Chronic systolic dysfunction / CHB s/p Medtronic CRT-D  euvolemic today Stable on an appropriate medical regimen Normal ICD function See Pace Art report No changes today  2. Permanent AF Rates controlled Continue eliquis. Plans to hold for upcoming surgery. I have asked patient to clarify instructions, we typically request it not to be held for >48 hours unless absolutely necessary.  CHA2DS2/VASc is at least 5.  3. CAD Denies s/s  ischemia  4. OA Planned for TKR. Cleared from cardiac perspective by Dr. [name] 07/16/21  Current medicines are reviewed at length with the patient today.     Disposition:   Follow up with Dr. [name] in 6 months    Signed, Shirley Friar, PA-C  10/06/2021 9:20 AM  [facility] [address] [place] [employer] [place] [address] [phone] (office) [phone] (fax)

## 2021-10-06 NOTE — Progress Notes (Signed)
Anesthesia Chart Review:  Follows with cardiology for history of permanent AF on Eliquis (s/p AV nodal ablation to allow effective biventricular pacing), nonischemic cardiomyopathy (EF 40% by echo 12/2020), CHB s/p Medtronic biventricular ICD implanted 09/2017.  Last seen 10/06/2021 and noted to be doing well at that time, euvolemic, stable on appropriate medical regimen.  Is also noted to have been cleared for TKR by Dr. [name].  Per Dr. Claris Gladden note 09/15/2021, "Left knee OA: Needs TKR.  He is stable from a cardiac standpoint.  I think that he could have TKR with moderate but not unacceptable risk of complications. This will take place in 1/23."  Form marked with COPD, on home oxygen.  Reportedly uses nightly and as needed during the day.  OSA, unable to tolerate CPAP.  DM2 well-controlled, A1c 5.3 on 09/21/2021.  Preop labs reviewed, unremarkable.  EKG 05/17/2021: Ventricular paced rhythm.  Rate 77.  TTE 12/20/2020: 1. Left ventricular ejection fraction, by estimation, is 40%. The left  ventricle has mild to moderately decreased function. The left ventricle  demonstrates global hypokinesis. There is mild left ventricular  hypertrophy. Left ventricular diastolic  parameters are indeterminate.   2. Right ventricular systolic function is mildly reduced. The right  ventricular size is mildly enlarged. There is normal pulmonary artery  systolic pressure. The estimated right ventricular systolic pressure is  59.7 mmHg.   3. Left atrial size was mild to moderately dilated.   4. Right atrial size was mildly dilated.   5. The mitral valve is normal in structure. No evidence of mitral valve  regurgitation. No evidence of mitral stenosis.   6. The aortic valve is tricuspid. Aortic valve regurgitation is not  visualized. Mild aortic valve sclerosis is present, with no evidence of  aortic valve stenosis.   7. Aortic dilatation noted. There is mild dilatation of the ascending  aorta, measuring 38 mm.    8. The inferior vena cava is normal in size with greater than 50%  respiratory variability, suggesting right atrial pressure of 3 mmHg.   Cardiopulmonary exercise test 05/01/2018: Conclusion: The interpretation of this test is limited due to submaximal effort during the exercise. Based on available data, exercise testing with gas exchange demonstrates mild functional impairment when compared to matched sedentary norms. At peak exercise, patient is ventilatory limited due to his obstructive/restrictive physiology and his body habitus. There was chronotropic incompetence, most likely in the setting of submaximal exercise.   Right/left cath 01/28/2017: 1. Mildly elevated LV and RV filling pressures though LVEDP is not elevated.  2. Preserved cardiac output.  3. Nonobstructive CAD => nonischemic cardiomyopathy.     Wynonia Musty [facility] Short Stay Center/Anesthesiology Phone [phone] 10/06/2021 1:19 PM

## 2021-10-06 NOTE — Anesthesia Preprocedure Evaluation (Addendum)
Anesthesia Evaluation  Patient identified by MRN, date of birth, ID band Patient awake    Reviewed: Allergy & Precautions, NPO status , Patient's Chart, lab work & pertinent test results  Airway Mallampati: II  TM Distance: >3 FB Neck ROM: Full    Dental no notable dental hx.    Pulmonary asthma , sleep apnea , COPD (2.5L QHS and PRN),  oxygen dependent, former smoker,    Pulmonary exam normal        Cardiovascular hypertension, Pt. on medications and Pt. on home beta blockers + Past MI and +CHF  + dysrhythmias Atrial Fibrillation + Cardiac Defibrillator  Rhythm:Irregular Rate:Normal     Neuro/Psych Anxiety Depression negative neurological ROS     GI/Hepatic Neg liver ROS, GERD  Medicated,  Endo/Other  diabetes  Renal/GU negative Renal ROS  negative genitourinary   Musculoskeletal  (+) Arthritis , Osteoarthritis,    Abdominal Normal abdominal exam  (+)   Peds  Hematology  (+) anemia ,   Anesthesia Other Findings   Reproductive/Obstetrics                            Anesthesia Physical Anesthesia Plan  ASA: 3  Anesthesia Plan: Spinal, MAC and Regional   Post-op Pain Management: Regional block   Induction: Intravenous  PONV Risk Score and Plan: 1 and Ondansetron, Dexamethasone, TIVA, Propofol infusion and Treatment may vary due to age or medical condition  Airway Management Planned: Simple Face Mask, Natural Airway and Nasal Cannula  Additional Equipment: None  Intra-op Plan:   Post-operative Plan:   Informed Consent: I have reviewed the patients History and Physical, chart, labs and discussed the procedure including the risks, benefits and alternatives for the proposed anesthesia with the patient or authorized representative who has indicated his/her understanding and acceptance.     Dental advisory given  Plan Discussed with: CRNA  Anesthesia Plan Comments: (PAT note by  Karoline Caldwell, PA-C: Follows with cardiology for history of permanent AF on Eliquis (s/p AV nodal ablation to allow effective biventricular pacing), nonischemic cardiomyopathy (EF 40% by echo 12/2020), CHB s/p Medtronic biventricular ICD implanted 09/2017.  Last seen 10/06/2021 and noted to be doing well at that time, euvolemic, stable on appropriate medical regimen.  Is also noted to have been cleared for TKR by Dr. [name].  Per Dr. Claris Gladden note 09/15/2021, "Left knee OA: Needs TKR. He is stable from a cardiac standpoint. I think that he could have TKR with moderate but not unacceptable risk of complications. This will take place in 1/23."  Form marked with COPD, on home oxygen.  Reportedly uses nightly and as needed during the day.  OSA, unable to tolerate CPAP.  DM2 well-controlled, A1c 5.3 on 09/21/2021.  Preop labs reviewed, unremarkable.  EKG 05/17/2021: Ventricular paced rhythm.  Rate 77.  TTE 12/20/2020: 1. Left ventricular ejection fraction, by estimation, is 40%. The left  ventricle has mild to moderately decreased function. The left ventricle  demonstrates global hypokinesis. There is mild left ventricular  hypertrophy. Left ventricular diastolic  parameters are indeterminate.  2. Right ventricular systolic function is mildly reduced. The right  ventricular size is mildly enlarged. There is normal pulmonary artery  systolic pressure. The estimated right ventricular systolic pressure is  93.7 mmHg.  3. Left atrial size was mild to moderately dilated.  4. Right atrial size was mildly dilated.  5. The mitral valve is normal in structure. No evidence of mitral valve  regurgitation. No evidence of mitral stenosis.  6. The aortic valve is tricuspid. Aortic valve regurgitation is not  visualized. Mild aortic valve sclerosis is present, with no evidence of  aortic valve stenosis.  7. Aortic dilatation noted. There is mild dilatation of the ascending  aorta, measuring 38 mm.  8.  The inferior vena cava is normal in size with greater than 50%  respiratory variability, suggesting right atrial pressure of 3 mmHg.   Cardiopulmonary exercise test 05/01/2018: Conclusion: The interpretation of this test is limited due to submaximal effort during the exercise. Based on available data, exercise testing with gas exchange demonstrates mild functional impairment when compared to matched sedentary norms. At peak exercise, patient is ventilatory limited due to his obstructive/restrictive physiology and his body habitus. There was chronotropic incompetence, most likely in the setting of submaximal exercise.   Right/left cath 01/28/2017: 1. Mildly elevated LV and RV filling pressures though LVEDP is not elevated.  2. Preserved cardiac output.  3. Nonobstructive CAD =>nonischemic cardiomyopathy.   )       Anesthesia Quick Evaluation

## 2021-10-06 NOTE — Patient Instructions (Signed)
Medication Instructions:  Your physician recommends that you continue on your current medications as directed. Please refer to the Current Medication list given to you today.  *If you need a refill on your cardiac medications before your next appointment, please call your pharmacy*   Lab Work: None  If you have labs (blood work) drawn today and your tests are completely normal, you will receive your results only by: MyChart Message (if you have MyChart) OR A paper copy in the mail If you have any lab test that is abnormal or we need to change your treatment, we will call you to review the results.   Follow-Up: At [facility], you and your health needs are our priority.  As part of our continuing mission to provide you with exceptional heart care, we have created designated Provider Care Teams.  These Care Teams include your primary Cardiologist (physician) and Advanced Practice Providers (APPs -  Physician Assistants and Nurse Practitioners) who all work together to provide you with the care you need, when you need it.   Your next appointment:   6 month(s)  The format for your next appointment:   In Person  Provider:   Steven Klein, MD{ 

## 2021-10-10 ENCOUNTER — Ambulatory Visit: Payer: Self-pay

## 2021-10-10 ENCOUNTER — Other Ambulatory Visit: Payer: Self-pay

## 2021-10-10 ENCOUNTER — Telehealth: Payer: Self-pay

## 2021-10-10 ENCOUNTER — Ambulatory Visit: Payer: Medicare HMO | Admitting: Family Medicine

## 2021-10-10 ENCOUNTER — Encounter: Payer: Self-pay | Admitting: Family Medicine

## 2021-10-10 ENCOUNTER — Ambulatory Visit (INDEPENDENT_AMBULATORY_CARE_PROVIDER_SITE_OTHER): Payer: Medicare HMO

## 2021-10-10 VITALS — BP 92/60 | HR 73 | Ht 70.0 in | Wt 248.0 lb

## 2021-10-10 DIAGNOSIS — R0781 Pleurodynia: Secondary | ICD-10-CM

## 2021-10-10 DIAGNOSIS — M1712 Unilateral primary osteoarthritis, left knee: Secondary | ICD-10-CM | POA: Diagnosis not present

## 2021-10-10 DIAGNOSIS — E1165 Type 2 diabetes mellitus with hyperglycemia: Secondary | ICD-10-CM

## 2021-10-10 DIAGNOSIS — S2020XA Contusion of thorax, unspecified, initial encounter: Secondary | ICD-10-CM | POA: Diagnosis not present

## 2021-10-10 DIAGNOSIS — M47817 Spondylosis without myelopathy or radiculopathy, lumbosacral region: Secondary | ICD-10-CM

## 2021-10-10 DIAGNOSIS — Z7901 Long term (current) use of anticoagulants: Secondary | ICD-10-CM | POA: Diagnosis not present

## 2021-10-10 MED ORDER — TRAMADOL HCL 50 MG PO TABS: 50.0000 mg | ORAL_TABLET | Freq: Two times a day (BID) | ORAL | 0 refills | Status: DC | PRN

## 2021-10-10 MED ORDER — METHYLPREDNISOLONE ACETATE 40 MG/ML IJ SUSP
40.0000 mg | Freq: Once | INTRAMUSCULAR | Status: AC
  Administered 2021-10-10: 11:00:00 40 mg via INTRAMUSCULAR

## 2021-10-10 NOTE — Assessment & Plan Note (Signed)
Patient is on Eliquis at this point.  Patient does have bruising.  Knows if any shortness of breath, abdominal pain out of the ordinary to seek medical attention immediately.

## 2021-10-10 NOTE — Patient Instructions (Addendum)
Xrays today Tramadol twice a day with tylenol for next 5 days I'll keep Dr Ninfa Linden aware

## 2021-10-10 NOTE — Progress Notes (Signed)
[facility] [place] [address] [place] Phone: [phone] Subjective:   IVilma [name], am serving as a scribe for Dr. Hulan Saas. This visit occurred during the SARS-CoV-2 public health emergency.  Safety protocols were in place, including screening questions prior to the visit, additional usage of staff PPE, and extensive cleaning of exam room while observing appropriate contact time as indicated for disinfecting solutions.   I'm seeing this patient by the request  of:  [name], MD  CC: Rib pain after fall  ATF:[id number]  [name] is a 65 y.o. male coming in with complaint of rib pain. Last seen on 08/22/2021 for knee and shoulder pain. Patient states he fell over weekend. Scheduled to have L knee replaced on 10/17/2021. Bruised rib on the right side, left knee and left shoulder pain.  Patient is able to take a deep breath.  Patient is accompanied with daughter.    Past Medical History:  Diagnosis Date   AICD (automatic cardioverter/defibrillator) present    Anemia    supposed to be taking Vit B but doesn't   ANXIETY    takes Xanax nightly   Arthritis    Asthma    Albuterol prn and Advair daily;also takes Prednisone daily   Atrial fibrillation ([place]) 09/06/2015   Cardiomyopathy ([place])    a. EF 25% TEE July 2013; b. EF normalized 2015;  c. 03/2015 Echo: EF 40-45%, difrf HK, PASP 38 mmHg, Mild MR, sev LAE/RAE.   CHF (congestive heart failure) (HCC)    Chronic constipation    takes OTC stool softener   COPD (chronic obstructive pulmonary disease) (HCC)    "one dr says COPD; one dr says emphysema" (09/18/2017)   DEPRESSION    takes Zoloft and Doxepin daily   Diverticulitis    DYSKINESIA, ESOPHAGUS    Dysrhythmia    Atrial Fibrilation   Emphysema of lung [facility])    "one dr says COPD; one dr says emphysema" (09/18/2017)   Essential hypertension        FIBROMYALGIA    GERD (gastroesophageal reflux disease)        Glaucoma     HYPERLIPIDEMIA    a. Intolerant to statins.   INSOMNIA    takes Ambien nightly   Myocardial infarction [facility])    a. 2012 Myoview notable for prior infarct;  b. 03/2015 Lexiscan CL: EF 37%, diff HK, small area of inferior infarct from apex to base-->Med Rx.   Myocardial infarction ([place])    O2 dependent    "2.5L q hs & prn" (09/18/2017)   Paroxysmal atrial fibrillation ([place])    a. CHA2DS2VASc = 3--> takes Coumadin;  b. 03/15/2015 Successful TEE/DCCV;  c. 03/2015 recurrent afib, Amio d/c'd in setting of hyperthyroidism.   Peripheral neuropathy    Pneumonia 12/2016   Rash and other nonspecific skin eruption 04/12/2009   no cause found saw dermatologists x 2 and allergist   SLEEP APNEA, OBSTRUCTIVE    a. doesn't use CPAP   Syncope    a. 03/2015 s/p MDT LINQ.   Type II diabetes mellitus ([place])        Past Surgical History:  Procedure Laterality Date   ACNE CYST REMOVAL     2 on back    AV NODE ABLATION N/A 10/25/2017   Procedure: AV NODE ABLATION;  Surgeon: Deboraha Sprang, MD;  Location: [place] [facility];  Service: Cardiovascular;  Laterality: N/A;   BIV ICD INSERTION CRT-D N/A 09/18/2017  Procedure: BIV ICD INSERTION CRT-D;  Surgeon: Deboraha Sprang, MD;  Location: [place] [facility];  Service: Cardiovascular;  Laterality: N/A;   CARDIAC CATHETERIZATION N/A 03/21/2016   Procedure: Right/Left Heart Cath and Coronary Angiography;  Surgeon: Larey Dresser, MD;  Location: [place] [facility];  Service: Cardiovascular;  Laterality: N/A;   CARDIOVERSION  04/18/2012   Procedure: CARDIOVERSION;  Surgeon: Fay Records, MD;  Location: [place];  Service: Cardiovascular;  Laterality: N/A;   CARDIOVERSION  04/25/2012   Procedure: CARDIOVERSION;  Surgeon: Thayer Headings, MD;  Location: [place];  Service: Cardiovascular;  Laterality: N/A;   CARDIOVERSION  04/25/2012   Procedure: CARDIOVERSION;  Surgeon: Fay Records, MD;  Location: [place];  Service: Cardiovascular;  Laterality: N/A;   CARDIOVERSION   05/09/2012   Procedure: CARDIOVERSION;  Surgeon: Sherren Mocha, MD;  Location: [place];  Service: Cardiovascular;  Laterality: N/A;  changed from crenshaw to cooper by trish/leone-endo   CARDIOVERSION N/A 03/15/2015   Procedure: CARDIOVERSION;  Surgeon: Thayer Headings, MD;  Location: [place];  Service: Cardiovascular;  Laterality: N/A;   COLONOSCOPY     COLONOSCOPY WITH PROPOFOL N/A 10/21/2014   Procedure: COLONOSCOPY WITH PROPOFOL;  Surgeon: Ladene Artist, MD;  Location: [facility];  Service: Endoscopy;  Laterality: N/A;   EP IMPLANTABLE DEVICE N/A 04/06/2015   Procedure: Loop Recorder Insertion;  Surgeon: Evans Lance, MD;  Location: [place] [facility];  Service: Cardiovascular;  Laterality: N/A;   ESOPHAGOGASTRODUODENOSCOPY     JOINT REPLACEMENT     LOOP RECORDER REMOVAL N/A 09/18/2017   Procedure: LOOP RECORDER REMOVAL;  Surgeon: Deboraha Sprang, MD;  Location: [place] [facility];  Service: Cardiovascular;  Laterality: N/A;   RIGHT/LEFT HEART CATH AND CORONARY ANGIOGRAPHY N/A 01/28/2017   Procedure: Right/Left Heart Cath and Coronary Angiography;  Surgeon: Larey Dresser, MD;  Location: [place] [facility];  Service: Cardiovascular;  Laterality: N/A;   TEE WITHOUT CARDIOVERSION  04/25/2012   Procedure: TRANSESOPHAGEAL ECHOCARDIOGRAM (TEE);  Surgeon: Thayer Headings, MD;  Location: [place];  Service: Cardiovascular;  Laterality: N/A;   TEE WITHOUT CARDIOVERSION N/A 03/15/2015   Procedure: TRANSESOPHAGEAL ECHOCARDIOGRAM (TEE);  Surgeon: Thayer Headings, MD;  Location: [place];  Service: Cardiovascular;  Laterality: N/A;   TONSILLECTOMY AND ADENOIDECTOMY     TOTAL KNEE ARTHROPLASTY Right 06/15/2014   Procedure: TOTAL KNEE ARTHROPLASTY;  Surgeon: Renette Butters, MD;  Location: [place];  Service: Orthopedics;  Laterality: Right;   Social History   Socioeconomic History   Marital status: Divorced    Spouse name: Not on file   Number of children: 2   Years of education: Not on  file   Highest education level: Not on file  Occupational History   Occupation: retired/disabled. prev worked in Therapist, sports.    Employer: DISABLED  Tobacco Use   Smoking status: Former    Packs/day: 2.00    Years: 30.00    Pack years: 60.00    Types: Cigarettes    Quit date: 10/16/2007    Years since quitting: 13.9   Smokeless tobacco: Never  Vaping Use   Vaping Use: Never used  Substance and Sexual Activity   Alcohol use: No   Drug use: No   Sexual activity: Not Currently  Other Topics Concern   Not on file  Social History Narrative   Lives alone.   Social Determinants of Health   Financial Resource Strain: Low Risk    Difficulty of  Paying Living Expenses: Not hard at all  Food Insecurity: No Food Insecurity   Worried About [place] in the Last Year: Never true   Ran Out of Food in the Last Year: Never true  Transportation Needs: No Transportation Needs   Lack of Transportation (Medical): No   Lack of Transportation (Non-Medical): No  Physical Activity: Inactive   Days of Exercise per Week: 0 days   Minutes of Exercise per Session: 0 min  Stress: No Stress Concern Present   Feeling of Stress : Not at all  Social Connections: Moderately Integrated   Frequency of Communication with Friends and Family: More than three times a week   Frequency of Social Gatherings with Friends and Family: More than three times a week   Attends Religious Services: More than 4 times per year   Active Member of [employer] or Organizations: Yes   Attends Music therapist: More than 4 times per year   Marital Status: Never married   Allergies  Allergen Reactions   Amiodarone Other (See Comments)    hyperthyroidism   Statins Other (See Comments)    myalgia   Tape Other (See Comments)    Skin Tears Use Paper Tape Only   Family History  Problem Relation Age of Onset   COPD Mother    Asthma Mother    Colon polyps Mother    Allergies Mother    Hypothyroidism  Mother    Asthma Maternal Grandmother    Colon cancer Neg Hx     Current Outpatient Medications (Endocrine & Metabolic):    dapagliflozin propanediol (FARXIGA) 10 MG TABS tablet, Take 1 tablet (10 mg total) by mouth daily before breakfast. (Patient not taking: Reported on 10/06/2021)   predniSONE (DELTASONE) 10 MG tablet, 3 tabs by mouth per day for 3 days,2tabs per day for 3 days,1tab per day for 3 days (Patient not taking: Reported on 10/04/2021)  Current Outpatient Medications (Cardiovascular):    carvedilol (COREG) 6.25 MG tablet, Take 1 tablet (6.25 mg total) by mouth 2 (two) times daily.   eplerenone (INSPRA) 50 MG tablet, Take 1 tablet (50 mg total) by mouth daily.   Evolocumab (REPATHA SURECLICK) 474 MG/ML SOAJ, INJECT 1 PEN INTO THE SKIN EVERY 14 (FOURTEEN) DAYS.   losartan (COZAAR) 25 MG tablet, Take 0.5 tablets (12.5 mg total) by mouth daily.   metolazone (ZAROXOLYN) 2.5 MG tablet, Take 1 tablet (2.5 mg total) by mouth as directed. With additional 40 meq of potassium (Patient taking differently: Take 2.5 mg by mouth daily. With additional 40 meq of potassium)   torsemide (DEMADEX) 20 MG tablet, Take 60-80 mg by mouth See admin instructions. Take 80 mg by mouth in the morning and 60 mg in the evening.  Current Outpatient Medications (Respiratory):    albuterol (VENTOLIN HFA) 108 (90 Base) MCG/ACT inhaler, INHALE 2 PUFFS BY MOUTH 4 TIMES A DAY AS NEEDED   fluticasone (FLONASE) 50 MCG/ACT nasal spray, PLACE 2 SPRAYS INTO BOTH NOSTRILS DAILY AS NEEDED FOR ALLERGIES OR RHINITIS.  Current Outpatient Medications (Analgesics):    acetaminophen (TYLENOL) 500 MG tablet, Take 500 mg by mouth every 6 (six) hours as needed for moderate pain.   traMADol (ULTRAM) 50 MG tablet, Take 1 tablet (50 mg total) by mouth every 12 (twelve) hours as needed.  Current Outpatient Medications (Hematological):    apixaban (ELIQUIS) 5 MG TABS tablet, Take 1 tablet (5 mg total) by mouth 2 (two) times  daily.  Current Outpatient Medications (Other):  ALPRAZolam (XANAX) 0.5 MG tablet, TAKE 2 TABLETS BY MOUTH AT BEDTIME   AMBULATORY NON FORMULARY MEDICATION, Rolling walker with a bench   augmented betamethasone dipropionate (DIPROLENE-AF) 0.05 % cream, Apply 1 application topically daily as needed (itching).   carisoprodol (SOMA) 350 MG tablet, TAKE 1 TABLET BY MOUTH THREE TIMES A DAY AS NEEDED FOR MUSCLE SPASMS   Cholecalciferol 100 MCG (4000 UT) CAPS, 1 tab by mouth once daily   doxepin (SINEQUAN) 10 MG capsule, TAKE 2 CAPSULES (20 MG TOTAL) BY MOUTH AT BEDTIME AS NEEDED.   Dupilumab (DUPIXENT) 300 MG/2ML SOPN, Inject 300 mg into the skin every 14 (fourteen) days.   fluticasone (CUTIVATE) 0.05 % cream, SMARTSIG:Topical 1-2 Times Daily PRN   hydrOXYzine (ATARAX/VISTARIL) 10 MG tablet, TAKE 1 TABLET BY MOUTH 3 TIMES DAILY AS NEEDED FOR ITCHING.   ketoconazole (NIZORAL) 2 % cream, APPLY 1 APPLICATION TOPICALLY DAILY (Patient taking differently: Apply 1 application topically daily as needed (dry skin).)   KLOR-CON M20 20 MEQ tablet, TAKE 1 TABLET BY MOUTH EVERY DAY (Patient taking differently: Take 40 mEq by mouth daily.)   lidocaine (LIDODERM) 5 %, APPLY 1 PATCH TO AFFECTED AREA FOR UP TO 12 HOURS . DO NOT USE MORE THAN ONE PATCH IN 24 HOURS.   omeprazole (PRILOSEC) 20 MG capsule, TAKE 1 CAPSULE BY MOUTH TWICE A DAY   tamsulosin (FLOMAX) 0.4 MG CAPS capsule, Take 0.4 mg by mouth daily.   traZODone (DESYREL) 100 MG tablet, 2 tab by mouth at bedtime as needed   triamcinolone (KENALOG) 0.1 % paste, Use as directed in the mouth or throat 2 (two) times daily.   TUMS 500 MG chewable tablet, Chew 500-2,000 mg by mouth every 4 (four) hours as needed for indigestion or heartburn.    venlafaxine XR (EFFEXOR-XR) 75 MG 24 hr capsule, Take 1 capsule (75 mg total) by mouth daily with breakfast.   zolpidem (AMBIEN) 10 MG tablet, TAKE 1 TABLET BY MOUTH EVERY DAY AT BEDTIME AS NEEDED   Reviewed prior  external information including notes and imaging from  primary care provider As well as notes that were available from care everywhere and other healthcare systems.  Past medical history, social, surgical and family history all reviewed in electronic medical record.  No pertanent information unless stated regarding to the chief complaint.   Review of Systems:  No headache, visual changes, nausea, vomiting, diarrhea, constipation, dizziness, abdominal pain, skin rash, fevers, chills, night sweats, weight loss, swollen lymph nodes, body aches, joint swelling, chest pain, shortness of breath, mood changes. POSITIVE muscle aches, body aches but denies any abdominal pain.  Did have a bowel movement today there was no problem.  Patient states he does remember the fall.  Patient's daughter states that she did pick him up immediately when she heard the sound.  Objective  Blood pressure 92/60, pulse 73, height 5\' 10"  (1.778 m), weight 248 lb (112.5 kg), SpO2 96 %.   General: No apparent distress alert and oriented x3 mood and affect normal, dressed appropriately.  HEENT: Pupils equal, extraocular movements intact  Respiratory: Patient's speak in full sentences and does not appear short of breath  Cardiovascular: Trace lower extremity edema, non tender, no erythema  Patient does have bruising over the knees bilaterally.  Patient does have a knee replacement on the right knee.  Left knee does have the arthritic changes.  Does have swelling over the tibial tuberosity and is tender to palpation.  Extensor mechanism is intact but is tender.  Patient does have good flexion noted. Patient does have bruising over the anterior lateral aspect of the rib cage.  Mild over the abdomen.  Nontender the over the abdomen.  No masses appreciated.  No rebound or involuntary guarding noted. Patient is tender over the rib itself.  Still no crepitus.     Impression and Recommendations:     The above documentation has been  reviewed and is accurate and complete Lyndal Pulley, DO

## 2021-10-10 NOTE — Assessment & Plan Note (Signed)
Patient encouraged to watch her blood sugars.

## 2021-10-10 NOTE — Telephone Encounter (Signed)
Pt called stating that he fell Saturday and he wanted to see if he can come in a be evaluated due to him having surgery next week. He thinks he has messed up his knee and a fractured rib.   Please advise

## 2021-10-10 NOTE — Assessment & Plan Note (Signed)
Patient is having right rib pain.  Patient does have pain over the area where he hit.  Does have bruising.  No abdominal pain.  No enlargement of the liver.  We will hold on the Toradol but given injection of Depo-Medrol.  Patient is scheduled to have a surgery for a left knee replacement in 1 week.  Increase short-term tramadol to twice a day for now.  Patient likely will be on other narcotics after the surgery.  Discussed that patient will likely need home physical therapy in the long-term.  Depending on x-rays if there is a fracture we will then send information to the orthopedic surgeon to discuss if he is able to have the surgery.  Patient does have some bruising of the left knee where patient did fall on it so we will check as well with an x-ray to make sure there is nothing new that would change surgical intervention.

## 2021-10-10 NOTE — Telephone Encounter (Signed)
Left message for patient

## 2021-10-12 ENCOUNTER — Other Ambulatory Visit: Payer: Self-pay | Admitting: Internal Medicine

## 2021-10-12 DIAGNOSIS — I4819 Other persistent atrial fibrillation: Secondary | ICD-10-CM

## 2021-10-12 DIAGNOSIS — I428 Other cardiomyopathies: Secondary | ICD-10-CM

## 2021-10-12 DIAGNOSIS — I255 Ischemic cardiomyopathy: Secondary | ICD-10-CM

## 2021-10-13 ENCOUNTER — Inpatient Hospital Stay (HOSPITAL_COMMUNITY): Admission: RE | Admit: 2021-10-13 | Payer: Medicare HMO | Source: Ambulatory Visit

## 2021-10-13 ENCOUNTER — Telehealth: Payer: Self-pay | Admitting: *Deleted

## 2021-10-13 ENCOUNTER — Other Ambulatory Visit (HOSPITAL_COMMUNITY)
Admission: RE | Admit: 2021-10-13 | Discharge: 2021-10-13 | Disposition: A | Discharge status: Home / Self Care | Payer: Medicare HMO | Source: Ambulatory Visit | Attending: Orthopaedic Surgery | Admitting: Orthopaedic Surgery

## 2021-10-13 DIAGNOSIS — Z01812 Encounter for preprocedural laboratory examination: Secondary | ICD-10-CM | POA: Insufficient documentation

## 2021-10-13 DIAGNOSIS — Z20822 Contact with and (suspected) exposure to covid-19: Secondary | ICD-10-CM | POA: Insufficient documentation

## 2021-10-13 DIAGNOSIS — Z01818 Encounter for other preprocedural examination: Secondary | ICD-10-CM

## 2021-10-13 LAB — SARS CORONAVIRUS 2 (TAT 6-24 HRS): SARS Coronavirus 2: NEGATIVE

## 2021-10-13 NOTE — Care Plan (Signed)
RNCM has been following patient for at least a month prior to his upcoming Left total knee arthroplasty answering questions and preparing him for his surgery with Dr. Ninfa Linden on 10/17/21. He is an Ortho bundle patient through [facility] and is agreeable to case management. He lives alone, but will be going to a friend's home after surgery. New address: [address] [place],  [address]. He does have a daughter as well. He will need a RW and 3in1/BSC after surgery. These will be ordered at the hospital and provided prior to discharge. Anticipate HHPT will be needed after hospital stay. Referral made to [facility] after choice provided. Reviewed all post op care instructions at length and provided copy of instructions via mail prior to surgery.

## 2021-10-13 NOTE — Telephone Encounter (Signed)
Ortho bundle pre-op call to patient. Noted that patient states he fell recently and bumped both knees and ribs. He was seen this week by Dr. Hulan Saas with Sports Medicine and X-rays were negative for fractures. Knees very bruised he states and sore along with chest where he fell.

## 2021-10-17 ENCOUNTER — Observation Stay (HOSPITAL_COMMUNITY): Payer: Medicare HMO

## 2021-10-17 ENCOUNTER — Encounter (HOSPITAL_COMMUNITY)
Admission: RE | Disposition: A | Discharge status: Home Health | Payer: Self-pay | Source: Ambulatory Visit | Attending: Orthopaedic Surgery

## 2021-10-17 ENCOUNTER — Ambulatory Visit (INDEPENDENT_AMBULATORY_CARE_PROVIDER_SITE_OTHER): Payer: Medicare HMO

## 2021-10-17 ENCOUNTER — Ambulatory Visit (HOSPITAL_COMMUNITY): Payer: Medicare HMO | Admitting: Physician Assistant

## 2021-10-17 ENCOUNTER — Encounter (HOSPITAL_COMMUNITY): Payer: Self-pay | Admitting: Orthopaedic Surgery

## 2021-10-17 ENCOUNTER — Other Ambulatory Visit: Payer: Self-pay

## 2021-10-17 ENCOUNTER — Ambulatory Visit (HOSPITAL_COMMUNITY): Payer: Medicare HMO | Admitting: Anesthesiology

## 2021-10-17 ENCOUNTER — Observation Stay (HOSPITAL_COMMUNITY)
Admission: RE | Admit: 2021-10-17 | Discharge: 2021-10-19 | Disposition: A | Discharge status: Home Health | Payer: Medicare HMO | Source: Ambulatory Visit | Attending: Orthopaedic Surgery | Admitting: Orthopaedic Surgery

## 2021-10-17 DIAGNOSIS — Z96652 Presence of left artificial knee joint: Secondary | ICD-10-CM | POA: Diagnosis not present

## 2021-10-17 DIAGNOSIS — J45909 Unspecified asthma, uncomplicated: Secondary | ICD-10-CM | POA: Insufficient documentation

## 2021-10-17 DIAGNOSIS — I5022 Chronic systolic (congestive) heart failure: Secondary | ICD-10-CM

## 2021-10-17 DIAGNOSIS — G8918 Other acute postprocedural pain: Secondary | ICD-10-CM | POA: Diagnosis not present

## 2021-10-17 DIAGNOSIS — J449 Chronic obstructive pulmonary disease, unspecified: Secondary | ICD-10-CM | POA: Insufficient documentation

## 2021-10-17 DIAGNOSIS — J439 Emphysema, unspecified: Secondary | ICD-10-CM | POA: Diagnosis not present

## 2021-10-17 DIAGNOSIS — I5033 Acute on chronic diastolic (congestive) heart failure: Secondary | ICD-10-CM | POA: Diagnosis not present

## 2021-10-17 DIAGNOSIS — Z95810 Presence of automatic (implantable) cardiac defibrillator: Secondary | ICD-10-CM

## 2021-10-17 DIAGNOSIS — I480 Paroxysmal atrial fibrillation: Secondary | ICD-10-CM | POA: Diagnosis not present

## 2021-10-17 DIAGNOSIS — M1712 Unilateral primary osteoarthritis, left knee: Principal | ICD-10-CM

## 2021-10-17 DIAGNOSIS — Z7901 Long term (current) use of anticoagulants: Secondary | ICD-10-CM | POA: Diagnosis not present

## 2021-10-17 DIAGNOSIS — Z8616 Personal history of COVID-19: Secondary | ICD-10-CM | POA: Insufficient documentation

## 2021-10-17 DIAGNOSIS — E119 Type 2 diabetes mellitus without complications: Secondary | ICD-10-CM | POA: Insufficient documentation

## 2021-10-17 DIAGNOSIS — G8929 Other chronic pain: Secondary | ICD-10-CM | POA: Insufficient documentation

## 2021-10-17 DIAGNOSIS — I110 Hypertensive heart disease with heart failure: Secondary | ICD-10-CM | POA: Diagnosis not present

## 2021-10-17 DIAGNOSIS — Z79899 Other long term (current) drug therapy: Secondary | ICD-10-CM | POA: Insufficient documentation

## 2021-10-17 DIAGNOSIS — Z87891 Personal history of nicotine dependence: Secondary | ICD-10-CM | POA: Insufficient documentation

## 2021-10-17 HISTORY — PX: TOTAL KNEE ARTHROPLASTY: SHX125

## 2021-10-17 LAB — GLUCOSE, CAPILLARY
Glucose-Capillary: 90 mg/dL (ref 70–99)
Glucose-Capillary: 83 mg/dL (ref 70–99)
Glucose-Capillary: 112 mg/dL — ABNORMAL HIGH (ref 70–99)
Glucose-Capillary: 153 mg/dL — ABNORMAL HIGH (ref 70–99)

## 2021-10-17 SURGERY — ARTHROPLASTY, KNEE, TOTAL
Anesthesia: Monitor Anesthesia Care | Site: Knee | Laterality: Left

## 2021-10-17 MED ORDER — POLYETHYLENE GLYCOL 3350 17 G PO PACK: 17.0000 g | PACK | Freq: Every day | ORAL | Status: DC | PRN

## 2021-10-17 MED ORDER — CEFAZOLIN SODIUM-DEXTROSE 2-4 GM/100ML-% IV SOLN
2.0000 g | INTRAVENOUS | Status: AC
  Administered 2021-10-17: 2 g via INTRAVENOUS
  Filled 2021-10-17: qty 100

## 2021-10-17 MED ORDER — ACETAMINOPHEN 325 MG PO TABS
325.0000 mg | ORAL_TABLET | Freq: Four times a day (QID) | ORAL | Status: DC | PRN
  Administered 2021-10-18 – 2021-10-19 (×5): 650 mg via ORAL
  Filled 2021-10-17 (×5): qty 2

## 2021-10-17 MED ORDER — LIDOCAINE 2% (20 MG/ML) 5 ML SYRINGE
INTRAMUSCULAR | Status: DC | PRN
Start: 2021-10-17 — End: 2021-10-17
  Administered 2021-10-17: 20 mg via INTRAVENOUS

## 2021-10-17 MED ORDER — ALPRAZOLAM 0.5 MG PO TABS
1.0000 mg | ORAL_TABLET | Freq: Every day | ORAL | Status: DC
  Administered 2021-10-17 – 2021-10-18 (×2): 1 mg via ORAL
  Filled 2021-10-17 (×2): qty 2

## 2021-10-17 MED ORDER — VENLAFAXINE HCL ER 75 MG PO CP24
75.0000 mg | ORAL_CAPSULE | Freq: Every day | ORAL | Status: DC
  Administered 2021-10-18 – 2021-10-19 (×2): 75 mg via ORAL
  Filled 2021-10-17 (×2): qty 1

## 2021-10-17 MED ORDER — PROPOFOL 10 MG/ML IV BOLUS
INTRAVENOUS | Status: AC
  Filled 2021-10-17: qty 20

## 2021-10-17 MED ORDER — ALBUTEROL SULFATE (2.5 MG/3ML) 0.083% IN NEBU: 2.5000 mg | INHALATION_SOLUTION | Freq: Four times a day (QID) | RESPIRATORY_TRACT | Status: DC | PRN

## 2021-10-17 MED ORDER — PHENYLEPHRINE HCL-NACL 20-0.9 MG/250ML-% IV SOLN
INTRAVENOUS | Status: AC
  Filled 2021-10-17: qty 250

## 2021-10-17 MED ORDER — TAMSULOSIN HCL 0.4 MG PO CAPS
0.4000 mg | ORAL_CAPSULE | Freq: Every day | ORAL | Status: DC
  Administered 2021-10-17 – 2021-10-19 (×3): 0.4 mg via ORAL
  Filled 2021-10-17 (×3): qty 1

## 2021-10-17 MED ORDER — PROPOFOL 10 MG/ML IV BOLUS
INTRAVENOUS | Status: DC | PRN
  Administered 2021-10-17: 20 mg via INTRAVENOUS
  Administered 2021-10-17: 30 mg via INTRAVENOUS

## 2021-10-17 MED ORDER — ALBUMIN HUMAN 5 % IV SOLN: INTRAVENOUS | Status: DC | PRN

## 2021-10-17 MED ORDER — PHENOL 1.4 % MT LIQD: 1.0000 | OROMUCOSAL | Status: DC | PRN

## 2021-10-17 MED ORDER — DOCUSATE SODIUM 100 MG PO CAPS
100.0000 mg | ORAL_CAPSULE | Freq: Two times a day (BID) | ORAL | Status: DC
  Administered 2021-10-17 – 2021-10-19 (×4): 100 mg via ORAL
  Filled 2021-10-17 (×4): qty 1

## 2021-10-17 MED ORDER — MIDAZOLAM HCL 2 MG/2ML IJ SOLN
INTRAMUSCULAR | Status: AC
  Filled 2021-10-17: qty 2

## 2021-10-17 MED ORDER — APIXABAN 5 MG PO TABS
5.0000 mg | ORAL_TABLET | Freq: Two times a day (BID) | ORAL | Status: DC
  Administered 2021-10-18 – 2021-10-19 (×3): 5 mg via ORAL
  Filled 2021-10-17 (×4): qty 1

## 2021-10-17 MED ORDER — SPIRONOLACTONE 25 MG PO TABS
25.0000 mg | ORAL_TABLET | Freq: Every day | ORAL | Status: DC
  Administered 2021-10-17 – 2021-10-18 (×2): 25 mg via ORAL
  Filled 2021-10-17 (×2): qty 1

## 2021-10-17 MED ORDER — METOCLOPRAMIDE HCL 5 MG/ML IJ SOLN: 5.0000 mg | Freq: Three times a day (TID) | INTRAMUSCULAR | Status: DC | PRN

## 2021-10-17 MED ORDER — ONDANSETRON HCL 4 MG/2ML IJ SOLN
INTRAMUSCULAR | Status: DC | PRN
Start: 2021-10-17 — End: 2021-10-17
  Administered 2021-10-17: 4 mg via INTRAVENOUS

## 2021-10-17 MED ORDER — CLONIDINE HCL (ANALGESIA) 100 MCG/ML EP SOLN
EPIDURAL | Status: DC | PRN
  Administered 2021-10-17: 100 ug

## 2021-10-17 MED ORDER — ONDANSETRON HCL 4 MG/2ML IJ SOLN
INTRAMUSCULAR | Status: AC
  Filled 2021-10-17: qty 2

## 2021-10-17 MED ORDER — CARVEDILOL 6.25 MG PO TABS
6.2500 mg | ORAL_TABLET | Freq: Two times a day (BID) | ORAL | Status: DC
  Administered 2021-10-17 – 2021-10-19 (×4): 6.25 mg via ORAL
  Filled 2021-10-17 (×4): qty 1

## 2021-10-17 MED ORDER — ACETAMINOPHEN 10 MG/ML IV SOLN
INTRAVENOUS | Status: AC
  Filled 2021-10-17: qty 100

## 2021-10-17 MED ORDER — ALUM & MAG HYDROXIDE-SIMETH 200-200-20 MG/5ML PO SUSP: 30.0000 mL | ORAL | Status: DC | PRN

## 2021-10-17 MED ORDER — CHLORHEXIDINE GLUCONATE 0.12 % MT SOLN
15.0000 mL | Freq: Once | OROMUCOSAL | Status: AC
  Administered 2021-10-17: 15 mL via OROMUCOSAL
  Filled 2021-10-17: qty 15

## 2021-10-17 MED ORDER — ONDANSETRON HCL 4 MG/2ML IJ SOLN
4.0000 mg | Freq: Four times a day (QID) | INTRAMUSCULAR | Status: DC | PRN
  Administered 2021-10-17: 4 mg via INTRAVENOUS
  Filled 2021-10-17: qty 2

## 2021-10-17 MED ORDER — HYDROMORPHONE HCL 1 MG/ML IJ SOLN
0.5000 mg | INTRAMUSCULAR | Status: DC | PRN
  Administered 2021-10-17: 1 mg via INTRAVENOUS
  Administered 2021-10-17: 0.5 mg via INTRAVENOUS
  Filled 2021-10-17 (×2): qty 1

## 2021-10-17 MED ORDER — LIDOCAINE 2% (20 MG/ML) 5 ML SYRINGE
INTRAMUSCULAR | Status: AC
  Filled 2021-10-17: qty 5

## 2021-10-17 MED ORDER — BUPIVACAINE IN DEXTROSE 0.75-8.25 % IT SOLN
INTRATHECAL | Status: DC | PRN
  Administered 2021-10-17: 1.8 mL via INTRATHECAL

## 2021-10-17 MED ORDER — DIPHENHYDRAMINE HCL 12.5 MG/5ML PO ELIX
12.5000 mg | ORAL_SOLUTION | ORAL | Status: DC | PRN
  Filled 2021-10-17: qty 10

## 2021-10-17 MED ORDER — OXYCODONE HCL 5 MG PO TABS
5.0000 mg | ORAL_TABLET | ORAL | Status: DC | PRN
  Administered 2021-10-17 – 2021-10-19 (×10): 10 mg via ORAL
  Filled 2021-10-17 (×10): qty 2

## 2021-10-17 MED ORDER — FENTANYL CITRATE (PF) 100 MCG/2ML IJ SOLN
INTRAMUSCULAR | Status: AC
  Filled 2021-10-17: qty 2

## 2021-10-17 MED ORDER — ALBUMIN HUMAN 5 % IV SOLN
12.5000 g | Freq: Once | INTRAVENOUS | Status: AC
  Administered 2021-10-17: 12.5 g via INTRAVENOUS

## 2021-10-17 MED ORDER — MIDAZOLAM HCL 2 MG/2ML IJ SOLN
INTRAMUSCULAR | Status: AC
  Administered 2021-10-17: 1 mg via INTRAVENOUS
  Filled 2021-10-17: qty 2

## 2021-10-17 MED ORDER — TRANEXAMIC ACID-NACL 1000-0.7 MG/100ML-% IV SOLN
1000.0000 mg | INTRAVENOUS | Status: AC
  Administered 2021-10-17: 1000 mg via INTRAVENOUS
  Filled 2021-10-17: qty 100

## 2021-10-17 MED ORDER — MENTHOL 3 MG MT LOZG: 1.0000 | LOZENGE | OROMUCOSAL | Status: DC | PRN

## 2021-10-17 MED ORDER — 0.9 % SODIUM CHLORIDE (POUR BTL) OPTIME
TOPICAL | Status: DC | PRN
  Administered 2021-10-17: 1000 mL

## 2021-10-17 MED ORDER — ALBUMIN HUMAN 5 % IV SOLN
INTRAVENOUS | Status: AC
  Filled 2021-10-17: qty 250

## 2021-10-17 MED ORDER — SODIUM CHLORIDE 0.9 % IV SOLN: INTRAVENOUS | Status: DC

## 2021-10-17 MED ORDER — CEFAZOLIN SODIUM-DEXTROSE 1-4 GM/50ML-% IV SOLN
1.0000 g | Freq: Four times a day (QID) | INTRAVENOUS | Status: AC
  Administered 2021-10-17 (×2): 1 g via INTRAVENOUS
  Filled 2021-10-17 (×2): qty 50

## 2021-10-17 MED ORDER — OXYCODONE HCL 5 MG PO TABS: 10.0000 mg | ORAL_TABLET | ORAL | Status: DC | PRN

## 2021-10-17 MED ORDER — CARISOPRODOL 350 MG PO TABS
350.0000 mg | ORAL_TABLET | Freq: Three times a day (TID) | ORAL | Status: DC | PRN
  Administered 2021-10-17 – 2021-10-19 (×4): 350 mg via ORAL
  Filled 2021-10-17 (×4): qty 1

## 2021-10-17 MED ORDER — ORAL CARE MOUTH RINSE: 15.0000 mL | Freq: Once | OROMUCOSAL | Status: AC

## 2021-10-17 MED ORDER — SODIUM CHLORIDE 0.9 % IR SOLN
Status: DC | PRN
Start: 2021-10-17 — End: 2021-10-17
  Administered 2021-10-17: 3000 mL

## 2021-10-17 MED ORDER — PANTOPRAZOLE SODIUM 40 MG PO TBEC
40.0000 mg | DELAYED_RELEASE_TABLET | Freq: Every day | ORAL | Status: DC
  Administered 2021-10-18 – 2021-10-19 (×2): 40 mg via ORAL
  Filled 2021-10-17 (×2): qty 1

## 2021-10-17 MED ORDER — ROPIVACAINE HCL 7.5 MG/ML IJ SOLN
INTRAMUSCULAR | Status: DC | PRN
Start: 2021-10-17 — End: 2021-10-17
  Administered 2021-10-17: 20 mL via PERINEURAL

## 2021-10-17 MED ORDER — PHENYLEPHRINE 40 MCG/ML (10ML) SYRINGE FOR IV PUSH (FOR BLOOD PRESSURE SUPPORT)
PREFILLED_SYRINGE | INTRAVENOUS | Status: DC | PRN
  Administered 2021-10-17 (×4): 80 ug via INTRAVENOUS
  Administered 2021-10-17: 40 ug via INTRAVENOUS

## 2021-10-17 MED ORDER — METOCLOPRAMIDE HCL 5 MG PO TABS: 5.0000 mg | ORAL_TABLET | Freq: Three times a day (TID) | ORAL | Status: DC | PRN

## 2021-10-17 MED ORDER — ONDANSETRON HCL 4 MG PO TABS: 4.0000 mg | ORAL_TABLET | Freq: Four times a day (QID) | ORAL | Status: DC | PRN

## 2021-10-17 MED ORDER — FENTANYL CITRATE (PF) 100 MCG/2ML IJ SOLN: 50.0000 ug | Freq: Once | INTRAMUSCULAR | Status: AC

## 2021-10-17 MED ORDER — PHENYLEPHRINE HCL-NACL 20-0.9 MG/250ML-% IV SOLN
INTRAVENOUS | Status: DC | PRN
  Administered 2021-10-17: 100 ug/min via INTRAVENOUS

## 2021-10-17 MED ORDER — ACETAMINOPHEN 10 MG/ML IV SOLN
1000.0000 mg | Freq: Once | INTRAVENOUS | Status: DC | PRN
  Administered 2021-10-17: 1000 mg via INTRAVENOUS

## 2021-10-17 MED ORDER — MIDAZOLAM HCL 2 MG/2ML IJ SOLN: 1.0000 mg | Freq: Once | INTRAMUSCULAR | Status: AC

## 2021-10-17 MED ORDER — POTASSIUM CHLORIDE CRYS ER 20 MEQ PO TBCR
40.0000 meq | EXTENDED_RELEASE_TABLET | Freq: Every day | ORAL | Status: DC
  Administered 2021-10-17 – 2021-10-19 (×3): 40 meq via ORAL
  Filled 2021-10-17 (×3): qty 2

## 2021-10-17 MED ORDER — LOSARTAN POTASSIUM 25 MG PO TABS
12.5000 mg | ORAL_TABLET | Freq: Every day | ORAL | Status: DC
  Administered 2021-10-17 – 2021-10-19 (×3): 12.5 mg via ORAL
  Filled 2021-10-17 (×3): qty 0.5

## 2021-10-17 MED ORDER — FENTANYL CITRATE (PF) 100 MCG/2ML IJ SOLN
25.0000 ug | INTRAMUSCULAR | Status: DC | PRN
  Administered 2021-10-17: 25 ug via INTRAVENOUS

## 2021-10-17 MED ORDER — FENTANYL CITRATE (PF) 250 MCG/5ML IJ SOLN
INTRAMUSCULAR | Status: AC
  Filled 2021-10-17: qty 5

## 2021-10-17 MED ORDER — DEXAMETHASONE SODIUM PHOSPHATE 10 MG/ML IJ SOLN
INTRAMUSCULAR | Status: AC
  Filled 2021-10-17: qty 1

## 2021-10-17 MED ORDER — PROPOFOL 500 MG/50ML IV EMUL
INTRAVENOUS | Status: DC | PRN
  Administered 2021-10-17: 25 ug/kg/min via INTRAVENOUS

## 2021-10-17 MED ORDER — LACTATED RINGERS IV SOLN: INTRAVENOUS | Status: DC

## 2021-10-17 MED ORDER — TORSEMIDE 20 MG PO TABS
60.0000 mg | ORAL_TABLET | Freq: Every day | ORAL | Status: DC
  Administered 2021-10-17 – 2021-10-18 (×2): 60 mg via ORAL
  Filled 2021-10-17 (×3): qty 3

## 2021-10-17 MED ORDER — DAPAGLIFLOZIN PROPANEDIOL 10 MG PO TABS
10.0000 mg | ORAL_TABLET | Freq: Every day | ORAL | Status: DC
  Administered 2021-10-18 – 2021-10-19 (×2): 10 mg via ORAL
  Filled 2021-10-17 (×3): qty 1

## 2021-10-17 MED ORDER — DEXAMETHASONE SODIUM PHOSPHATE 10 MG/ML IJ SOLN
INTRAMUSCULAR | Status: DC | PRN
  Administered 2021-10-17: 5 mg via INTRAVENOUS

## 2021-10-17 MED ORDER — FENTANYL CITRATE (PF) 100 MCG/2ML IJ SOLN
INTRAMUSCULAR | Status: AC
  Administered 2021-10-17: 50 ug via INTRAVENOUS
  Filled 2021-10-17: qty 2

## 2021-10-17 MED ORDER — TORSEMIDE 20 MG PO TABS
80.0000 mg | ORAL_TABLET | ORAL | Status: DC
  Administered 2021-10-18 – 2021-10-19 (×2): 80 mg via ORAL
  Filled 2021-10-17 (×3): qty 4

## 2021-10-17 SURGICAL SUPPLY — 82 items
BAG COUNTER SPONGE SURGICOUNT (BAG) ×2 IMPLANT
BAG SPNG CNTER NS LX DISP (BAG) ×1
BANDAGE ESMARK 6X9 LF (GAUZE/BANDAGES/DRESSINGS) ×1 IMPLANT
BLADE SAG 18X100X1.27 (BLADE) ×3 IMPLANT
BNDG CMPR 9X6 STRL LF SNTH (GAUZE/BANDAGES/DRESSINGS) ×1
BNDG CMPR MED 10X6 ELC LF (GAUZE/BANDAGES/DRESSINGS) ×1
BNDG ELASTIC 6X10 VLCR STRL LF (GAUZE/BANDAGES/DRESSINGS) ×1 IMPLANT
BNDG ELASTIC 6X5.8 VLCR STR LF (GAUZE/BANDAGES/DRESSINGS) ×4 IMPLANT
BNDG ESMARK 6X9 LF (GAUZE/BANDAGES/DRESSINGS) ×2
BOWL SMART MIX CTS (DISPOSABLE) ×2 IMPLANT
BSPLAT TIB 5D F CMNT KN LT (Knees) ×1 IMPLANT
CEMENT BONE R 1X40 (Cement) ×2 IMPLANT
COMP FEM CEMT PERSONA STD SZ7 (Knees) ×2 IMPLANT
COMPONENT FEM CMT PRSN STD SZ7 (Knees) IMPLANT
COVER SURGICAL LIGHT HANDLE (MISCELLANEOUS) ×2 IMPLANT
CUFF TOURN SGL QUICK 34 (TOURNIQUET CUFF) ×2
CUFF TOURN SGL QUICK 42 (TOURNIQUET CUFF) IMPLANT
CUFF TRNQT CYL 34X4.125X (TOURNIQUET CUFF) ×1 IMPLANT
DRAPE EXTREMITY T 121X128X90 (DISPOSABLE) ×2 IMPLANT
DRAPE HALF SHEET 40X57 (DRAPES) ×2 IMPLANT
DRAPE U-SHAPE 47X51 STRL (DRAPES) ×3 IMPLANT
DRESSING AQUACEL AG SP 3.5X10 (GAUZE/BANDAGES/DRESSINGS) IMPLANT
DRSG AQUACEL AG SP 3.5X10 (GAUZE/BANDAGES/DRESSINGS) ×2
DRSG PAD ABDOMINAL 8X10 ST (GAUZE/BANDAGES/DRESSINGS) ×2 IMPLANT
DRSG XEROFORM 1X8 (GAUZE/BANDAGES/DRESSINGS) ×1 IMPLANT
DURAPREP 26ML APPLICATOR (WOUND CARE) ×2 IMPLANT
ELECT CAUTERY BLADE 6.4 (BLADE) ×2 IMPLANT
ELECT REM PT RETURN 9FT ADLT (ELECTROSURGICAL) ×2
ELECTRODE REM PT RTRN 9FT ADLT (ELECTROSURGICAL) ×1 IMPLANT
FACESHIELD WRAPAROUND (MASK) ×4 IMPLANT
FACESHIELD WRAPAROUND OR TEAM (MASK) ×2 IMPLANT
GAUZE SPONGE 4X4 12PLY STRL (GAUZE/BANDAGES/DRESSINGS) ×2 IMPLANT
GAUZE SPONGE 4X4 12PLY STRL LF (GAUZE/BANDAGES/DRESSINGS) ×1 IMPLANT
GAUZE XEROFORM 1X8 LF (GAUZE/BANDAGES/DRESSINGS) ×2 IMPLANT
GLOVE SRG 8 PF TXTR STRL LF DI (GLOVE) ×4 IMPLANT
GLOVE SURG ORTHO LTX SZ7.5 (GLOVE) ×3 IMPLANT
GLOVE SURG ORTHO LTX SZ8 (GLOVE) ×3 IMPLANT
GLOVE SURG UNDER POLY LF SZ8 (GLOVE) ×4
GOWN STRL REUS W/ TWL LRG LVL3 (GOWN DISPOSABLE) IMPLANT
GOWN STRL REUS W/ TWL XL LVL3 (GOWN DISPOSABLE) ×4 IMPLANT
GOWN STRL REUS W/TWL LRG LVL3 (GOWN DISPOSABLE)
GOWN STRL REUS W/TWL XL LVL3 (GOWN DISPOSABLE) ×4
HANDPIECE INTERPULSE COAX TIP (DISPOSABLE) ×2
HDLS TROCR DRIL PIN KNEE 75 (PIN) ×4
IMMOBILIZER KNEE 22 UNIV (SOFTGOODS) ×2 IMPLANT
KIT BASIN OR (CUSTOM PROCEDURE TRAY) ×3 IMPLANT
KIT TURNOVER KIT B (KITS) ×2 IMPLANT
MANIFOLD NEPTUNE II (INSTRUMENTS) ×2 IMPLANT
NDL 18GX1X1/2 (RX/OR ONLY) (NEEDLE) IMPLANT
NEEDLE 18GX1X1/2 (RX/OR ONLY) (NEEDLE) IMPLANT
NS IRRIG 1000ML POUR BTL (IV SOLUTION) ×2 IMPLANT
PACK TOTAL JOINT (CUSTOM PROCEDURE TRAY) ×2 IMPLANT
PAD ABD 7.5X8 STRL (GAUZE/BANDAGES/DRESSINGS) ×1 IMPLANT
PAD ARMBOARD 7.5X6 YLW CONV (MISCELLANEOUS) ×2 IMPLANT
PAD CAST 4YDX4 CTTN HI CHSV (CAST SUPPLIES) IMPLANT
PADDING CAST COTTON 4X4 STRL (CAST SUPPLIES) ×2
PADDING CAST COTTON 6X4 STRL (CAST SUPPLIES) ×2 IMPLANT
PIN DRILL HDLS TROCAR 75 4PK (PIN) IMPLANT
SCREW FEMALE HEX FIX 25X2.5 (ORTHOPEDIC DISPOSABLE SUPPLIES) ×1 IMPLANT
SET HNDPC FAN SPRY TIP SCT (DISPOSABLE) ×1 IMPLANT
SET PAD KNEE POSITIONER (MISCELLANEOUS) ×2 IMPLANT
STAPLER VISISTAT 35W (STAPLE) IMPLANT
STEM POLY PAT PLY 32M KNEE (Knees) ×1 IMPLANT
STEM TIBIA 5 DEG SZ F L KNEE (Knees) IMPLANT
STEM TIBIAL SZ6-7 EF 12 LT (Knees) ×1 IMPLANT
STRIP CLOSURE SKIN 1/2X4 (GAUZE/BANDAGES/DRESSINGS) IMPLANT
SUCTION FRAZIER HANDLE 10FR (MISCELLANEOUS) ×4
SUCTION TUBE FRAZIER 10FR DISP (MISCELLANEOUS) ×2 IMPLANT
SUT MNCRL AB 4-0 PS2 18 (SUTURE) IMPLANT
SUT VIC AB 0 CT1 27 (SUTURE) ×6
SUT VIC AB 0 CT1 27XBRD ANBCTR (SUTURE) ×1 IMPLANT
SUT VIC AB 1 CT1 27 (SUTURE) ×8
SUT VIC AB 1 CT1 27XBRD ANBCTR (SUTURE) ×2 IMPLANT
SUT VIC AB 2-0 CT1 27 (SUTURE) ×4
SUT VIC AB 2-0 CT1 TAPERPNT 27 (SUTURE) ×4 IMPLANT
SYR 50ML LL SCALE MARK (SYRINGE) IMPLANT
TIBIA STEM 5 DEG SZ F L KNEE (Knees) ×2 IMPLANT
TOWEL GREEN STERILE (TOWEL DISPOSABLE) ×3 IMPLANT
TOWEL GREEN STERILE FF (TOWEL DISPOSABLE) ×2 IMPLANT
TRAY CATH 16FR W/PLASTIC CATH (SET/KITS/TRAYS/PACK) IMPLANT
TRAY CATH INTERMITTENT SS 16FR (CATHETERS) ×1 IMPLANT
WRAP KNEE MAXI GEL POST OP (GAUZE/BANDAGES/DRESSINGS) ×2 IMPLANT

## 2021-10-17 NOTE — Evaluation (Signed)
Physical Therapy Evaluation Patient Details Name: [name] MRN: [id number] DOB: Apr 01, 1956 Today's Date: 10/17/2021  History of Present Illness  Pt is a 66 y.o. M who presents s/p left total knee arthroplasty 10/17/2021. Significant PMH: rotator cuff arthroplasty of both shoulders, HFpEF, ICD, COPD, atrial fibrillation, DM2, right TKA.  Clinical Impression  PTA, pt lives with his family in a mobile home with 5 steps to enter and has been recently using a walker for limited ambulation. Pt reports a fall on Christmas day. Pt presents with decreased functional mobility secondary to LLE weakness, decreased ROM, balance deficits, gait abnormalities, and pain. Pt requiring min assist for stand pivot transfer using a walker to the chair. Initiated home exercise program and provided written instruction. Will continue to progress mobility as tolerated.      Recommendations for follow up therapy are one component of a multi-disciplinary discharge planning process, led by the attending physician.  Recommendations may be updated based on patient status, additional functional criteria and insurance authorization.  Follow Up Recommendations Home health PT    Assistance Recommended at Discharge PRN  Patient can return home with the following  A little help with walking and/or transfers;Help with stairs or ramp for entrance;A little help with bathing/dressing/bathroom    Equipment Recommendations Rolling walker (2 wheels);BSC/3in1  Recommendations for Other Services       Functional Status Assessment Patient has had a recent decline in their functional status and demonstrates the ability to make significant improvements in function in a reasonable and predictable amount of time.     Precautions / Restrictions Precautions Precautions: Fall Restrictions Weight Bearing Restrictions: No      Mobility  Bed Mobility Overal bed mobility: Needs Assistance Bed Mobility: Supine to Sit     Supine to  sit: Min assist     General bed mobility comments: MinA for LLE negotiation out of bed, cues for sequencing, increased time/effort    Transfers Overall transfer level: Needs assistance Equipment used: Rolling walker (2 wheels) Transfers: Sit to/from Stand;Bed to chair/wheelchair/BSC Sit to Stand: Min assist   Step pivot transfers: Min assist       General transfer comment: MinA to rise from edge of bed and take pivotal steps towards right to recliner. cues for sequencing/direction    Ambulation/Gait                  Stairs            Wheelchair Mobility    Modified Rankin (Stroke Patients Only)       Balance Overall balance assessment: Needs assistance Sitting-balance support: Feet supported Sitting balance-Leahy Scale: Fair     Standing balance support: Bilateral upper extremity supported;Reliant on assistive device for balance                                 Pertinent Vitals/Pain Pain Assessment: Faces Faces Pain Scale: Hurts even more Pain Location: L knee Pain Descriptors / Indicators: Discomfort;Grimacing;Operative site guarding Pain Intervention(s): Limited activity within patient's tolerance;Monitored during session    [place] expects to be discharged to:: Private residence Living Arrangements: Spouse/significant other;Children Available Help at Discharge: Family;Available PRN/intermittently Type of Home: Mobile home Home Access: Stairs to enter Entrance Stairs-Rails: Right Entrance Stairs-Number of Steps: 5   Home Layout: One level Home Equipment: None (has old RW he has been using)      Prior Function Prior Level of Function :  Needs assist             Mobility Comments: using 2 canes, then fell on Christmas, and since has been using an old walker       Hand Dominance   Dominant Hand: Left    Extremity/Trunk Assessment   Upper Extremity Assessment Upper Extremity Assessment: Defer to OT  evaluation    Lower Extremity Assessment Lower Extremity Assessment: Generalized weakness;LLE deficits/detail LLE Deficits / Details: s/p TKA, unable to perform SLR, ankle WFL       Communication   Communication: No difficulties  Cognition Arousal/Alertness: Awake/alert Behavior During Therapy: WFL for tasks assessed/performed Overall Cognitive Status: Within Functional Limits for tasks assessed                                          General Comments      Exercises Total Joint Exercises Ankle Circles/Pumps: Both;20 reps;Supine Quad Sets: Left;10 reps;Supine Heel Slides: AAROM;Left;5 reps;Supine Straight Leg Raises: AAROM;Left;5 reps;Supine   Assessment/Plan    PT Assessment Patient needs continued PT services  PT Problem List Decreased strength;Decreased range of motion;Decreased activity tolerance;Decreased balance;Decreased mobility;Pain       PT Treatment Interventions DME instruction;Gait training;Stair training;Functional mobility training;Therapeutic activities;Therapeutic exercise;Balance training;Patient/family education    PT Goals (Current goals can be found in the Care Plan section)  Acute Rehab PT Goals Patient Stated Goal: to walk PT Goal Formulation: With patient Time For Goal Achievement: 10/31/21 Potential to Achieve Goals: Good    Frequency 7X/week     Co-evaluation               AM-PAC PT "6 Clicks" Mobility  Outcome Measure Help needed turning from your back to your side while in a flat bed without using bedrails?: A Little Help needed moving from lying on your back to sitting on the side of a flat bed without using bedrails?: A Little Help needed moving to and from a bed to a chair (including a wheelchair)?: A Little Help needed standing up from a chair using your arms (e.g., wheelchair or bedside chair)?: A Little Help needed to walk in hospital room?: A Lot Help needed climbing 3-5 steps with a railing? : Total 6  Click Score: 15    End of Session Equipment Utilized During Treatment: Gait belt;Other (comment) (KI for initial evaluation) Activity Tolerance: Patient tolerated treatment well Patient left: in chair;with call bell/phone within reach Nurse Communication: Mobility status PT Visit Diagnosis: Unsteadiness on feet (R26.81);Other abnormalities of gait and mobility (R26.89);History of falling (Z91.81);Muscle weakness (generalized) (M62.81);Difficulty in walking, not elsewhere classified (R26.2);Pain Pain - Right/Left: Left Pain - part of body: Knee    Time: 1650-1705 PT Time Calculation (min) (ACUTE ONLY): 15 min   Charges:   PT Evaluation $PT Eval Low Complexity: [place], PT, DPT Acute Rehabilitation Services Pager [phone] Office [phone]   Deno Etienne 10/17/2021, 5:38 PM

## 2021-10-17 NOTE — Transfer of Care (Signed)
Immediate Anesthesia Transfer of Care Note  Patient: [name]  Procedure(s) Performed: Left TOTAL KNEE ARTHROPLASTY (Left: Knee)  Patient Location: PACU  Anesthesia Type:MAC combined with regional for post-op pain  Level of Consciousness: awake, patient cooperative and responds to stimulation  Airway & Oxygen Therapy: Patient Spontanous Breathing and Patient connected to nasal cannula oxygen  Post-op Assessment: Report given to RN and Post -op Vital signs reviewed and stable  Post vital signs: Reviewed and stable  Last Vitals:  Vitals Value Taken Time  BP 94/60 10/17/21 1221  Temp    Pulse 71 10/17/21 1223  Resp 18 10/17/21 1223  SpO2 100 % 10/17/21 1223  Vitals shown include unvalidated device data.  Last Pain:  Vitals:   10/17/21 0832  TempSrc:   PainSc: 10-Worst pain ever      Patients Stated Pain Goal: 5 (16/94/50 3888)  Complications: No notable events documented.

## 2021-10-17 NOTE — Anesthesia Procedure Notes (Signed)
Spinal  Patient location during procedure: OR Start time: 10/17/2021 10:13 AM End time: 10/17/2021 10:15 AM Staffing Performed: anesthesiologist  Anesthesiologist: Darral Dash, DO Preanesthetic Checklist Completed: patient identified, IV checked, site marked, risks and benefits discussed, surgical consent, monitors and equipment checked, pre-op evaluation and timeout performed Spinal Block Patient position: sitting Prep: DuraPrep Patient monitoring: heart rate, cardiac monitor, continuous pulse ox and blood pressure Approach: midline Location: L3-4 Injection technique: single-shot Needle Needle type: Pencan  Needle gauge: 24 G Needle length: 10 cm Assessment Events: CSF return Additional Notes Patient identified. Risks/Benefits/Options discussed with patient including but not limited to bleeding, infection, nerve damage, paralysis, failed block, incomplete pain control, headache, blood pressure changes, nausea, vomiting, reactions to medications, itching and postpartum back pain. Confirmed with bedside nurse the patient's most recent platelet count. Confirmed with patient that they are not currently taking any anticoagulation, have any bleeding history or any family history of bleeding disorders. Patient expressed understanding and wished to proceed. All questions were answered. Sterile technique was used throughout the entire procedure. Please see nursing notes for vital signs. Warning signs of high block given to the patient including shortness of breath, tingling/numbness in hands, complete motor block, or any concerning symptoms with instructions to call for help. Patient was given instructions on fall risk and not to get out of bed. All questions and concerns addressed with instructions to call with any issues or inadequate analgesia.

## 2021-10-17 NOTE — Anesthesia Procedure Notes (Signed)
°  Anesthesia Regional Block: Adductor canal block   Pre-Anesthetic Checklist: , timeout performed,  Correct Patient, Correct Site, Correct Laterality,  Correct Procedure, Correct Position, site marked,  Risks and benefits discussed,  Surgical consent,  Pre-op evaluation,  At surgeon's request and post-op pain management  Laterality: Left  Prep: Dura Prep       Needles:  Injection technique: Single-shot  Needle Type: Echogenic Stimulator Needle     Needle Length: 10cm  Needle Gauge: 20     Additional Needles:   Procedures:,,,, ultrasound used (permanent image in chart),,    Narrative:  Start time: 10/17/2021 9:36 AM End time: 10/17/2021 9:38 AM Injection made incrementally with aspirations every 5 mL.  Performed by: Personally  Anesthesiologist: Darral Dash, DO  Additional Notes: Patient identified. Risks/Benefits/Options discussed with patient including but not limited to bleeding, infection, nerve damage, failed block, incomplete pain control. Patient expressed understanding and wished to proceed. All questions were answered. Sterile technique was used throughout the entire procedure. Please see nursing notes for vital signs. Aspirated in 5cc intervals with injection for negative confirmation. Patient was given instructions on fall risk and not to get out of bed. All questions and concerns addressed with instructions to call with any issues or inadequate analgesia.

## 2021-10-17 NOTE — Anesthesia Procedure Notes (Signed)
Date/Time: 10/17/2021 10:02 AM Performed by: Michele Rockers, CRNA Pre-anesthesia Checklist: Patient identified, Emergency Drugs available, Suction available, Timeout performed and Patient being monitored Patient Re-evaluated:Patient Re-evaluated prior to induction Oxygen Delivery Method: Simple face mask

## 2021-10-17 NOTE — Progress Notes (Signed)
No need to call medtronic representative per Dr. Rex Kras.

## 2021-10-17 NOTE — Brief Op Note (Signed)
10/17/2021  12:02 PM  PATIENT:  [name]  66 y.o. male  PRE-OPERATIVE DIAGNOSIS:  Osteoarthritis, Degenerative Joint Diease Left Knee  POST-OPERATIVE DIAGNOSIS:  Osteoarthritis, Degenerative Joint Diease Left Knee  PROCEDURE:  Procedure(s): Left TOTAL KNEE ARTHROPLASTY (Left)  SURGEON:  Surgeon(s) and Role:    Mcarthur Rossetti, MD - Primary  PHYSICIAN ASSISTANT:  Benita Stabile, PA-C  ANESTHESIA:   regional and spinal  COUNTS:  YES  TOURNIQUET:   Total Tourniquet Time Documented: Thigh (Left) - 61 minutes Total: Thigh (Left) - 61 minutes   DICTATION: .Other Dictation: Dictation Number [id number]  PLAN OF CARE: Admit for overnight observation  PATIENT DISPOSITION:  PACU - hemodynamically stable.   Delay start of Pharmacological VTE agent (>24hrs) due to surgical blood loss or risk of bleeding: no

## 2021-10-17 NOTE — Op Note (Signed)
NAME: [name] RECORD NO: [id number] ACCOUNT NO: [id number] DATE OF BIRTH: 05/02/1956 FACILITY: [facility] LOCATION: [facility] PHYSICIAN: Lind Guest. Ninfa Linden, MD  Operative Report   DATE OF PROCEDURE: 10/17/2021  PREOPERATIVE DIAGNOSIS:  Severe end-stage arthritis and degenerative joint disease, left knee.  POSTOPERATIVE DIAGNOSIS:  Severe end-stage arthritis and degenerative joint disease, left knee.  PROCEDURE:  Cemented left total knee arthroplasty.  IMPLANTS:  Biomet/Zimmer Persona knee system with size 7 left CR femur, size F left tibia tray, left 12 mm medial congruent fixed bearing polyethylene insert, size 32 polyethylene patellar button.  SURGEON:  Lind Guest. Ninfa Linden, MD  ASSISTANT:  Benita Stabile, PA-C  ANESTHESIA: 1.  Left lower extremity adductor canal block. 2.  Spinal.  ANTIBIOTICS:  2 g IV Ancef.  TOURNIQUET TIME:  1 hour.  BLOOD LOSS:  100 mL.  COMPLICATIONS:  None.  INDICATIONS:  The patient is a 66 year old gentleman with debilitating arthritis involving his left knee.  He actually has a previous right total knee arthroplasty done by one of my colleagues in town in 2015.  He saw me up for treatment for his left  knee.  He has significant varus malalignment.  He is mildly obese with a BMI of 35-36.  He has been on blood thinning medication.  His knee is so deformed that conservative treatment has failed and he has been falling quite a bit.  At this point, we have  recommended a knee replacement.  We talked in detail about the risk of acute blood loss anemia, nerve or vessel injury, fracture, infection, DVT, implant failure, and skin and soft tissue issues.  We talked about our goals being hopefully decrease pain,  improve mobility, and overall improve quality of life.  DESCRIPTION OF PROCEDURE:  After informed consent was obtained, appropriate left knee was marked, and adductor canal block was obtained of the left lower extremity in the holding  room.  He was then brought to the operating room and sat up on the  operating table.  Spinal anesthesia was obtained. A nonsterile tourniquet was placed around his upper left thigh, and his left thigh, knee, leg, ankle, and foot were prepped and draped with DuraPrep and sterile drapes.  Timeout was called, and he was  identified as correct patient, correct left knee.  We then used an Esmarch to wrap that leg and tourniquet was inflated to 300 mm of pressure.  We then made a direct midline incision over the patella and carried this proximally and distally.  There was  abrasion over the patella and we ellipsed this out and this was from a previous fall.  We then dissected down to the knee joint, carried out a medial parapatellar arthrotomy, finding a large joint effusion and significant deformity within his knee.  With  his knee in a flexed position, we removed remnants of the ACL, the medial and lateral meniscus, as well as osteophytes in all three compartments.  We found complete cartilage wear on the medial compartment and significant to complete cartilage wear in  the lateral compartment. With the knee in a flexed position, we used the extramedullary cutting guide for making our proximal tibia cut.  We tried to take as minimal bone as possible correcting varus and valgus in a 5-degree slope.  We then went to the  femur using intramedullary guide for the femur setting this for left knee at 10 mm distal femoral cut in 5 degrees externally rotated.  We made that cut without difficulty as well  and brought the knee back down to full extension with a 10 mm extension  block and achieved full extension.  We went back to the femur and put our femoral sizing guide based off the epicondylar axis.  Based off of this, we chose a size 7 femur.  We put a 4-in-1 cutting block for a size 7 femur, made our anterior and posterior  cuts, followed by our chamfer cuts.  We then made our femoral box cut.  Attention was then  turned back to the tibia.  We chose a size F left tibial tray for coverage, setting the rotation of the femur and the tibial tubercle.  We made our keel punch and  drill hole off of this.  With size F left trial tibia, we trialed a left 7 femur and went up to a 12 mm medial congruent fixed bearing polyethylene insert and we were pleased with range of motion and stability with that insert.  We then made our  patellar cut and drilled three holes for a size 32 patellar button.  With all trial instrumentation in the knee, I put the knee through several cycles of motion.  We were pleased with range of motion and stability.  We then removed all instrumentation  from the knee and irrigated the knee with normal saline solution using pulsatile lavage.  We mixed our cement and then with the knee in a flexed position cemented our Biomet Zimmer size left F tibial tray followed by our size left 7 femur.  We placed our  12 mm medial congruent fixed bearing polyethylene insert and cemented our size 32 patellar button, and again this was a Chartered certified accountant knee system.  We then held the knee fully extended while the cement hardened completely.  We then let the tourniquet  down.  Hemostasis was obtained with electrocautery.  We closed the arthrotomy with interrupted #1 Vicryl suture followed by 0 Vicryl to close deep tissue, 2-0 Vicryl to close subcutaneous tissue.  The skin was closed with staples.  A well-padded sterile  dressing was applied.  He was taken to recovery room in stable condition with all final counts being correct.  No complications noted.  Of note, Benita Stabile, PA-C, did assist during the entire case, and assistance was crucial for facilitating all aspects of this case.   ROH D: 10/17/2021 12:01:07 pm T: 10/17/2021 3:57:00 pm  JOB: [id number]/ [id number]

## 2021-10-17 NOTE — H&P (Signed)
TOTAL KNEE ADMISSION H&P  Patient is being admitted for left total knee arthroplasty.  Subjective:  Chief Complaint:left knee pain.  HPI: [name], 66 y.o. male, has a history of pain and functional disability in the left knee due to arthritis and has failed non-surgical conservative treatments for greater than 12 weeks to includeNSAID's and/or analgesics, corticosteriod injections, viscosupplementation injections, flexibility and strengthening excercises, use of assistive devices, weight reduction as appropriate, and activity modification.  Onset of symptoms was gradual, starting 4 years ago with gradually worsening course since that time. The patient noted no past surgery on the left knee(s).  Patient currently rates pain in the left knee(s) at 10 out of 10 with activity. Patient has night pain, worsening of pain with activity and weight bearing, pain that interferes with activities of daily living, pain with passive range of motion, crepitus, and joint swelling.  Patient has evidence of severe end-stage arthritis by imaging studies. There is no active infection.  Patient Active Problem List   Diagnosis Date Noted   Unilateral primary osteoarthritis, left knee 10/17/2021   Rib pain on right side 10/10/2021   History of colonic polyps 09/26/2021   Acute cough 07/21/2021   Bursitis of left hip 06/13/2021   Tooth fracture 06/11/2021   Pain and swelling of right lower leg 05/18/2021   Hemorrhagic shock ([place]) 05/05/2021   Gastrointestinal hemorrhage    Diverticulitis of colon    Preop exam for internal medicine 03/04/2021   B12 deficiency 03/04/2021   Vitamin D deficiency 03/01/2021   Myalgia 12/01/2020   Laceration of left leg 09/17/2020   Statin myopathy 08/02/2020   Rotator cuff arthropathy of both shoulders 02/23/2020   (HFpEF) heart failure with preserved ejection fraction ([place]) 02/22/2020   ICD (implantable cardioverter-defibrillator) in place 12/27/2019   COVID-19 virus  infection 12/09/2019   COVID-19 12/09/2019   Hypotension 12/09/2019   Upper respiratory infection 11/27/2019   COPD exacerbation ([place]) 11/27/2019   Elevated PSA 09/23/2019   Lip lesion 12/23/2018   Diabetic ulcer of left lower leg with necrosis of muscle ([place]) 12/05/2018   Chronic bursitis of left shoulder 07/01/2018   Pain due to total knee replacement ([place]) 03/31/2018   Rotator cuff arthropathy, left 02/13/2018   Right rotator cuff tear arthropathy 01/14/2018   Increased prostate specific antigen (PSA) velocity 09/26/2017   Trigger point of thoracic region 07/04/2017   Right lumbar radiculopathy 05/15/2017   Right leg swelling 05/09/2017   Glaucoma 04/18/2017   History of nasal polyposis 02/19/2017   Chronic rhinitis 02/19/2017   Microhematuria 12/04/2016   Hyperlipidemia 08/15/2016   Encounter for well adult exam with abnormal findings 11/29/2015   Persistent atrial fibrillation ([place]) 03/30/2015   Syncope 03/30/2015   Acute on chronic diastolic heart failure ([place]) 03/30/2015   Former tobacco use 02/25/2015   Type 2 diabetes mellitus ([place]) 02/25/2015   Atrial fibrillation ([place]) 02/25/2015   Anemia 02/23/2015   Thyrotoxicosis 02/23/2015   Hx of adenomatous colonic polyps 10/21/2014   De Quervain's tenosynovitis, right 04/30/2014   Osteoarthritis of right knee 02/19/2014   Encounter for therapeutic drug monitoring 11/17/2013   Thoracic degenerative disc disease 06/17/2013   Thoracic spondylosis without myelopathy 02/11/2013   Lumbosacral spondylosis without myelopathy 02/11/2013   Degenerative joint disease of knee, left 02/11/2013   COPD (chronic obstructive pulmonary disease) ([place]) 08/20/2012   Bundle branch block, right and extreme left anterior fascicular 05/05/2012   Nonischemic cardiomyopathy ([place]) 05/02/2012   Other primary cardiomyopathies 05/02/2012   Anticoagulated  on warfarin 03/21/2012   Spongiotic dermatitis 09/26/2011   Past myocardial infarction 04/19/2011    UNSPECIFIED URETHRAL STRICTURE 12/26/2010   Chronic pain syndrome 05/18/2010   Obstructive sleep apnea 10/21/2007   NEPHROLITHIASIS, HX OF 10/21/2007   Anxiety 06/09/2007   GERD (gastroesophageal reflux disease) 06/09/2007   Benign prostatic hyperplasia 06/09/2007   Morbid obesity ([place]) 06/03/2007   Depression 06/03/2007   Essential hypertension 06/03/2007   INSOMNIA 06/03/2007   Past Medical History:  Diagnosis Date   AICD (automatic cardioverter/defibrillator) present    Anemia    supposed to be taking Vit B but doesn't   ANXIETY    takes Xanax nightly   Arthritis    Asthma    Albuterol prn and Advair daily;also takes Prednisone daily   Atrial fibrillation ([place]) 09/06/2015   Cardiomyopathy ([place])    a. EF 25% TEE July 2013; b. EF normalized 2015;  c. 03/2015 Echo: EF 40-45%, difrf HK, PASP 38 mmHg, Mild MR, sev LAE/RAE.   CHF (congestive heart failure) (H[facility])    Chronic constipation    takes OTC stool softener   COPD (chronic obstructive pulmonary disease) ([facility])    "one dr says COPD; one dr says emphysema" (09/18/2017)   DEPRESSION    takes Zoloft and Doxepin daily   Diverticulitis    DYSKINESIA, ESOPHAGUS    Dysrhythmia    Atrial Fibrilation   Emphysema of lung [facility])    "one dr says COPD; one dr says emphysema" (09/18/2017)   Essential hypertension        FIBROMYALGIA    GERD (gastroesophageal reflux disease)        Glaucoma    HYPERLIPIDEMIA    a. Intolerant to statins.   INSOMNIA    takes Ambien nightly   Myocardial infarction [facility])    a. 2012 Myoview notable for prior infarct;  b. 03/2015 Lexiscan CL: EF 37%, diff HK, small area of inferior infarct from apex to base-->Med Rx.   Myocardial infarction ([place])    O2 dependent    "2.5L q hs & prn" (09/18/2017)   Paroxysmal atrial fibrillation ([place])    a. CHA2DS2VASc = 3--> takes Coumadin;  b. 03/15/2015 Successful TEE/DCCV;  c. 03/2015 recurrent afib, Amio d/c'd in setting of hyperthyroidism.   Peripheral neuropathy     Pneumonia 12/2016   Rash and other nonspecific skin eruption 04/12/2009   no cause found saw dermatologists x 2 and allergist   SLEEP APNEA, OBSTRUCTIVE    a. doesn't use CPAP   Syncope    a. 03/2015 s/p MDT LINQ.   Type II diabetes mellitus ([place])         Past Surgical History:  Procedure Laterality Date   ACNE CYST REMOVAL     2 on back    AV NODE ABLATION N/A 10/25/2017   Procedure: AV NODE ABLATION;  Surgeon: Deboraha Sprang, MD;  Location: [place] [facility];  Service: Cardiovascular;  Laterality: N/A;   BIV ICD INSERTION CRT-D N/A 09/18/2017   Procedure: BIV ICD INSERTION CRT-D;  Surgeon: Deboraha Sprang, MD;  Location: [place] [facility];  Service: Cardiovascular;  Laterality: N/A;   CARDIAC CATHETERIZATION N/A 03/21/2016   Procedure: Right/Left Heart Cath and Coronary Angiography;  Surgeon: Larey Dresser, MD;  Location: [place] [facility];  Service: Cardiovascular;  Laterality: N/A;   CARDIOVERSION  04/18/2012   Procedure: CARDIOVERSION;  Surgeon: Fay Records, MD;  Location: [place];  Service: Cardiovascular;  Laterality: N/A;   CARDIOVERSION  04/25/2012  Procedure: CARDIOVERSION;  Surgeon: Thayer Headings, MD;  Location: [place];  Service: Cardiovascular;  Laterality: N/A;   CARDIOVERSION  04/25/2012   Procedure: CARDIOVERSION;  Surgeon: Fay Records, MD;  Location: [place];  Service: Cardiovascular;  Laterality: N/A;   CARDIOVERSION  05/09/2012   Procedure: CARDIOVERSION;  Surgeon: Sherren Mocha, MD;  Location: [place];  Service: Cardiovascular;  Laterality: N/A;  changed from crenshaw to cooper by trish/leone-endo   CARDIOVERSION N/A 03/15/2015   Procedure: CARDIOVERSION;  Surgeon: Thayer Headings, MD;  Location: [place];  Service: Cardiovascular;  Laterality: N/A;   COLONOSCOPY     COLONOSCOPY WITH PROPOFOL N/A 10/21/2014   Procedure: COLONOSCOPY WITH PROPOFOL;  Surgeon: Ladene Artist, MD;  Location: [facility];  Service: Endoscopy;  Laterality: N/A;   EP IMPLANTABLE  DEVICE N/A 04/06/2015   Procedure: Loop Recorder Insertion;  Surgeon: Evans Lance, MD;  Location: [place] [facility];  Service: Cardiovascular;  Laterality: N/A;   ESOPHAGOGASTRODUODENOSCOPY     JOINT REPLACEMENT     LOOP RECORDER REMOVAL N/A 09/18/2017   Procedure: LOOP RECORDER REMOVAL;  Surgeon: Deboraha Sprang, MD;  Location: [place] [facility];  Service: Cardiovascular;  Laterality: N/A;   RIGHT/LEFT HEART CATH AND CORONARY ANGIOGRAPHY N/A 01/28/2017   Procedure: Right/Left Heart Cath and Coronary Angiography;  Surgeon: Larey Dresser, MD;  Location: [place] [facility];  Service: Cardiovascular;  Laterality: N/A;   TEE WITHOUT CARDIOVERSION  04/25/2012   Procedure: TRANSESOPHAGEAL ECHOCARDIOGRAM (TEE);  Surgeon: Thayer Headings, MD;  Location: [place];  Service: Cardiovascular;  Laterality: N/A;   TEE WITHOUT CARDIOVERSION N/A 03/15/2015   Procedure: TRANSESOPHAGEAL ECHOCARDIOGRAM (TEE);  Surgeon: Thayer Headings, MD;  Location: [place];  Service: Cardiovascular;  Laterality: N/A;   TONSILLECTOMY AND ADENOIDECTOMY     TOTAL KNEE ARTHROPLASTY Right 06/15/2014   Procedure: TOTAL KNEE ARTHROPLASTY;  Surgeon: Renette Butters, MD;  Location: [place];  Service: Orthopedics;  Laterality: Right;    Current Facility-Administered Medications  Medication Dose Route Frequency Provider Last Rate Last Admin   ceFAZolin (ANCEF) IVPB 2g/100 mL premix  2 g Intravenous On Call to OR Pete Pelt, PA-C       lactated ringers infusion   Intravenous Continuous Ellender, Karyl Kinnier, MD 10 mL/hr at 10/17/21 0834 New Bag at 10/17/21 0834   tranexamic acid (CYKLOKAPRON) IVPB 1,000 mg  1,000 mg Intravenous To OR Pete Pelt, PA-C       Allergies  Allergen Reactions   Amiodarone Other (See Comments)    hyperthyroidism   Statins Other (See Comments)    myalgia   Tape Other (See Comments)    Skin Tears Use Paper Tape Only    Social History   Tobacco Use   Smoking status: Former    Packs/day:  2.00    Years: 30.00    Pack years: 60.00    Types: Cigarettes    Quit date: 10/16/2007    Years since quitting: 14.0   Smokeless tobacco: Never  Substance Use Topics   Alcohol use: No    Family History  Problem Relation Age of Onset   COPD Mother    Asthma Mother    Colon polyps Mother    Allergies Mother    Hypothyroidism Mother    Asthma Maternal Grandmother    Colon cancer Neg Hx      Review of Systems  Musculoskeletal:  Positive for gait problem and joint swelling.  All other systems reviewed  and are negative.  Objective:  Physical Exam Vitals reviewed.  Constitutional:      Appearance: Normal appearance.  HENT:     Head: Normocephalic and atraumatic.  Eyes:     Extraocular Movements: Extraocular movements intact.     Pupils: Pupils are equal, round, and reactive to light.  Cardiovascular:     Rate and Rhythm: Normal rate.     Pulses: Normal pulses.  Pulmonary:     Effort: Pulmonary effort is normal.  Abdominal:     Palpations: Abdomen is soft.  Musculoskeletal:     Cervical back: Normal range of motion and neck supple.     Left knee: Effusion and bony tenderness present. Decreased range of motion. Tenderness present over the medial joint line, lateral joint line and patellar tendon. Abnormal alignment and abnormal meniscus.  Neurological:     Mental Status: He is alert and oriented to person, place, and time.  Psychiatric:        Behavior: Behavior normal.    Vital signs in last 24 hours: Temp:  [98.7 F (37.1 C)] 98.7 F (37.1 C) (01/03 0816) Pulse Rate:  [71] 71 (01/03 0816) Resp:  [18] 18 (01/03 0816) BP: (101)/(58) 101/58 (01/03 0816) SpO2:  [98 %] 98 % (01/03 0816) Weight:  [112.5 kg] 112.5 kg (01/03 0816)  Labs:   Estimated body mass index is 35.58 kg/m as calculated from the following:   Height as of this encounter: 5\' 10"  (1.778 m).   Weight as of this encounter: 112.5 kg.   Imaging Review Plain radiographs demonstrate severe  degenerative joint disease of the left knee(s). The overall alignment ismild varus. The bone quality appears to be good for age and reported activity level.      Assessment/Plan:  End stage arthritis, left knee   The patient history, physical examination, clinical judgment of the provider and imaging studies are consistent with end stage degenerative joint disease of the left knee(s) and total knee arthroplasty is deemed medically necessary. The treatment options including medical management, injection therapy arthroscopy and arthroplasty were discussed at length. The risks and benefits of total knee arthroplasty were presented and reviewed. The risks due to aseptic loosening, infection, stiffness, patella tracking problems, thromboembolic complications and other imponderables were discussed. The patient acknowledged the explanation, agreed to proceed with the plan and consent was signed. Patient is being admitted for inpatient treatment for surgery, pain control, PT, OT, prophylactic antibiotics, VTE prophylaxis, progressive ambulation and ADL's and discharge planning. The patient is planning to be discharged home with home health services

## 2021-10-18 DIAGNOSIS — E119 Type 2 diabetes mellitus without complications: Secondary | ICD-10-CM | POA: Diagnosis not present

## 2021-10-18 DIAGNOSIS — M1712 Unilateral primary osteoarthritis, left knee: Secondary | ICD-10-CM | POA: Diagnosis not present

## 2021-10-18 DIAGNOSIS — G8929 Other chronic pain: Secondary | ICD-10-CM | POA: Diagnosis not present

## 2021-10-18 DIAGNOSIS — J45909 Unspecified asthma, uncomplicated: Secondary | ICD-10-CM | POA: Diagnosis not present

## 2021-10-18 DIAGNOSIS — I110 Hypertensive heart disease with heart failure: Secondary | ICD-10-CM | POA: Diagnosis not present

## 2021-10-18 DIAGNOSIS — I480 Paroxysmal atrial fibrillation: Secondary | ICD-10-CM | POA: Diagnosis not present

## 2021-10-18 DIAGNOSIS — J449 Chronic obstructive pulmonary disease, unspecified: Secondary | ICD-10-CM | POA: Diagnosis not present

## 2021-10-18 DIAGNOSIS — I5033 Acute on chronic diastolic (congestive) heart failure: Secondary | ICD-10-CM | POA: Diagnosis not present

## 2021-10-18 DIAGNOSIS — Z7901 Long term (current) use of anticoagulants: Secondary | ICD-10-CM | POA: Diagnosis not present

## 2021-10-18 LAB — BASIC METABOLIC PANEL
Anion gap: 7 (ref 5–15)
BUN: 23 mg/dL (ref 8–23)
CO2: 25 mmol/L (ref 22–32)
Calcium: 9.3 mg/dL (ref 8.9–10.3)
Chloride: 104 mmol/L (ref 98–111)
Creatinine, Ser: 1.27 mg/dL — ABNORMAL HIGH (ref 0.61–1.24)
GFR, Estimated: >60 mL/min (ref >60)
Glucose, Bld: 125 mg/dL — ABNORMAL HIGH (ref 70–99)
Potassium: 5 mmol/L (ref 3.5–5.1)
Sodium: 136 mmol/L (ref 135–145)

## 2021-10-18 LAB — CBC
HCT: 38.4 % — ABNORMAL LOW (ref 39.0–52.0)
Hemoglobin: 12 g/dL — ABNORMAL LOW (ref 13.0–17.0)
MCH: 30.8 pg (ref 26.0–34.0)
MCHC: 31.3 g/dL (ref 30.0–36.0)
MCV: 98.5 fL (ref 80.0–100.0)
Platelets: 217 10*3/uL (ref 150–400)
RBC: 3.9 MIL/uL — ABNORMAL LOW (ref 4.22–5.81)
RDW: 15.5 % (ref 11.5–15.5)
WBC: 13.6 10*3/uL — ABNORMAL HIGH (ref 4.0–10.5)
nRBC: 0 % (ref 0.0–0.2)

## 2021-10-18 LAB — GLUCOSE, CAPILLARY
Glucose-Capillary: 94 mg/dL (ref 70–99)
Glucose-Capillary: 87 mg/dL (ref 70–99)
Glucose-Capillary: 82 mg/dL (ref 70–99)
Glucose-Capillary: 103 mg/dL — ABNORMAL HIGH (ref 70–99)

## 2021-10-18 NOTE — TOC Initial Note (Signed)
Transition of Care [facility]) - Initial/Assessment Note    Patient Details  Name: [name] MRN: [id number] Date of Birth: 31-Oct-1955  Transition of Care [facility]) CM/SW Contact:    Carles Collet, RN Phone Number: 10/18/2021, 12:10 PM  Clinical Narrative:             [name] w patient at bedside. He states that he plans to return home at [place], his daughter and son in law live with him. He is pre arranged with [facility], confirmed w liaison. He will need 3/1 and RW for home. Faxed to [facility] faxed 11:45 10/18/21       Expected Discharge Plan: [place] Barriers to Discharge: Continued Medical Work up   Patient Goals and CMS Choice Patient states their goals for this hospitalization and ongoing recovery are:: to go home      Expected Discharge Plan and Services Expected Discharge Plan: [place]   Discharge Planning Services: CM Consult Post Acute Care Choice: Home Health, Durable Medical Equipment                   DME Arranged: 3-N-1, Walker rolling       Representative spoke with at DME Agency: [facility] HH Arranged: PT, OT HH Agency: [place] [facility] Agency Contacted: 10/18/21 Time HH Agency Contacted: 56 Representative spoke with at [place]: [place]  Prior Living Arrangements/Services   Lives with:: Adult Children   Do you feel safe going back to the place where you live?: Yes               Activities of Daily Living      Permission Sought/Granted                  Emotional Assessment              Admission diagnosis:  Status post total knee replacement, left [Z96.652] Patient Active Problem List   Diagnosis Date Noted   Unilateral primary osteoarthritis, left knee 10/17/2021   Status post total knee replacement, left 10/17/2021   Rib pain on right side 10/10/2021   History of colonic polyps 09/26/2021   Acute cough 07/21/2021   Bursitis of left hip 06/13/2021   Tooth fracture  06/11/2021   Pain and swelling of right lower leg 05/18/2021   Hemorrhagic shock ([place]) 05/05/2021   Gastrointestinal hemorrhage    Diverticulitis of colon    Preop exam for internal medicine 03/04/2021   B12 deficiency 03/04/2021   Vitamin D deficiency 03/01/2021   Myalgia 12/01/2020   Laceration of left leg 09/17/2020   Statin myopathy 08/02/2020   Rotator cuff arthropathy of both shoulders 02/23/2020   (HFpEF) heart failure with preserved ejection fraction ([place]) 02/22/2020   ICD (implantable cardioverter-defibrillator) in place 12/27/2019   COVID-19 virus infection 12/09/2019   COVID-19 12/09/2019   Hypotension 12/09/2019   Upper respiratory infection 11/27/2019   COPD exacerbation ([place]) 11/27/2019   Elevated PSA 09/23/2019   Lip lesion 12/23/2018   Diabetic ulcer of left lower leg with necrosis of muscle ([place]) 12/05/2018   Chronic bursitis of left shoulder 07/01/2018   Pain due to total knee replacement ([place]) 03/31/2018   Rotator cuff arthropathy, left 02/13/2018   Right rotator cuff tear arthropathy 01/14/2018   Increased prostate specific antigen (PSA) velocity 09/26/2017   Trigger point of thoracic region 07/04/2017   Right lumbar radiculopathy 05/15/2017   Right leg swelling 05/09/2017   Glaucoma  04/18/2017   History of nasal polyposis 02/19/2017   Chronic rhinitis 02/19/2017   Microhematuria 12/04/2016   Hyperlipidemia 08/15/2016   Encounter for well adult exam with abnormal findings 11/29/2015   Persistent atrial fibrillation ([place]) 03/30/2015   Syncope 03/30/2015   Acute on chronic diastolic heart failure ([place]) 03/30/2015   Former tobacco use 02/25/2015   Type 2 diabetes mellitus ([place]) 02/25/2015   Atrial fibrillation ([place]) 02/25/2015   Anemia 02/23/2015   Thyrotoxicosis 02/23/2015   Hx of adenomatous colonic polyps 10/21/2014   De Quervain's tenosynovitis, right 04/30/2014   Osteoarthritis of right knee 02/19/2014   Encounter for therapeutic drug monitoring  11/17/2013   Thoracic degenerative disc disease 06/17/2013   Thoracic spondylosis without myelopathy 02/11/2013   Lumbosacral spondylosis without myelopathy 02/11/2013   Degenerative joint disease of knee, left 02/11/2013   COPD (chronic obstructive pulmonary disease) ([place]) 08/20/2012   Bundle branch block, right and extreme left anterior fascicular 05/05/2012   Nonischemic cardiomyopathy ([place]) 05/02/2012   Other primary cardiomyopathies 05/02/2012   Anticoagulated on warfarin 03/21/2012   Spongiotic dermatitis 09/26/2011   Past myocardial infarction 04/19/2011   UNSPECIFIED URETHRAL STRICTURE 12/26/2010   Chronic pain syndrome 05/18/2010   Obstructive sleep apnea 10/21/2007   NEPHROLITHIASIS, HX OF 10/21/2007   Anxiety 06/09/2007   GERD (gastroesophageal reflux disease) 06/09/2007   Benign prostatic hyperplasia 06/09/2007   Morbid obesity ([place]) 06/03/2007   Depression 06/03/2007   Essential hypertension 06/03/2007   INSOMNIA 06/03/2007   PCP:  [name], MD Pharmacy:   [id number] - [place], [place] [address] Phone: [phone] Fax: [phone]  [facility] [place], [place] [address] Phone: [phone] Fax: [phone]     Social Determinants of Health (SDOH) Interventions    Readmission Risk Interventions No flowsheet data found.

## 2021-10-18 NOTE — Anesthesia Postprocedure Evaluation (Signed)
Anesthesia Post Note  Patient: [name]  Procedure(s) Performed: Left TOTAL KNEE ARTHROPLASTY (Left: Knee)     Patient location during evaluation: PACU Anesthesia Type: Regional and General Level of consciousness: awake and alert Pain management: pain level controlled Vital Signs Assessment: post-procedure vital signs reviewed and stable Respiratory status: spontaneous breathing, nonlabored ventilation, respiratory function stable and patient connected to nasal cannula oxygen Cardiovascular status: blood pressure returned to baseline and stable Postop Assessment: no apparent nausea or vomiting Anesthetic complications: no   No notable events documented.  Last Vitals:  Vitals:   10/18/21 0411 10/18/21 0853  BP: (!) 130/95 95/60  Pulse: 70 70  Resp: 18 16  Temp: 36.9 C   SpO2: 96% 98%    Last Pain:  Vitals:   10/18/21 0625  TempSrc:   PainSc: 7                  Merilynn Haydu P Jennalynn Rivard

## 2021-10-18 NOTE — Progress Notes (Signed)
Subjective: 1 Day Post-Op Procedure(s) (LRB): Left TOTAL KNEE ARTHROPLASTY (Left) Patient reports pain as moderate.    Objective: Vital signs in last 24 hours: Temp:  [97.7 F (36.5 C)-98.7 F (37.1 C)] 98.5 F (36.9 C) (01/04 0411) Pulse Rate:  [67-75] 70 (01/04 0411) Resp:  [12-28] 18 (01/04 0411) BP: (79-130)/(53-95) 130/95 (01/04 0411) SpO2:  [95 %-100 %] 96 % (01/04 0411) Weight:  [112.5 kg] 112.5 kg (01/03 0816)  Intake/Output from previous day: 01/03 0701 - 01/04 0700 In: 1050 [I.V.:600; IV Piggyback:450] Out: 700 [Urine:500; Blood:200] Intake/Output this shift: No intake/output data recorded.  No results for input(s): HGB in the last 72 hours. No results for input(s): WBC, RBC, HCT, PLT in the last 72 hours. No results for input(s): NA, K, CL, CO2, BUN, CREATININE, GLUCOSE, CALCIUM in the last 72 hours. No results for input(s): LABPT, INR in the last 72 hours.  Sensation intact distally Intact pulses distally Dorsiflexion/Plantar flexion intact Incision: scant drainage No cellulitis present Compartment soft   Assessment/Plan: 1 Day Post-Op Procedure(s) (LRB): Left TOTAL KNEE ARTHROPLASTY (Left) Up with therapy Discharge home with home health likely tomorrow.      Mcarthur Rossetti 10/18/2021, 7:56 AM

## 2021-10-18 NOTE — Discharge Instructions (Signed)

## 2021-10-18 NOTE — Evaluation (Signed)
Occupational Therapy Evaluation Patient Details Name: [name] MRN: [id number] DOB: September 09, 1956 Today's Date: 10/18/2021   History of Present Illness Pt is a 66 y.o. M who presents s/p left total knee arthroplasty 10/17/2021. Significant PMH: rotator cuff arthroplasty of both shoulders, HFpEF, ICD, COPD, atrial fibrillation, DM2, right TKA.   Clinical Impression   Pt admitted for procedure listed above. PTA pt reported that he was fairly independent with all ADL's and IADL's, including driving. At this time, pt requiring max assist for LB ADL's due to pain, as pt is unable to reach down and don pants/underwear/socks. Pt is also limited in functional mobility, requiring increased time and short distances. Recommending HHOT at this time to maximize his independence and safety at home. OT will follow acutely.       Recommendations for follow up therapy are one component of a multi-disciplinary discharge planning process, led by the attending physician.  Recommendations may be updated based on patient status, additional functional criteria and insurance authorization.   Follow Up Recommendations  Home health OT    Assistance Recommended at Discharge Intermittent Supervision/Assistance  Patient can return home with the following A little help with walking and/or transfers;A little help with bathing/dressing/bathroom;Assistance with cooking/housework;Help with stairs or ramp for entrance    Functional Status Assessment  Patient has had a recent decline in their functional status and demonstrates the ability to make significant improvements in function in a reasonable and predictable amount of time.  Equipment Recommendations  BSC/3in1;Other (comment);Tub/shower bench (RW)    Recommendations for Other Services       Precautions / Restrictions Precautions Precautions: Fall Restrictions Weight Bearing Restrictions: Yes LLE Weight Bearing: Weight bearing as tolerated      Mobility Bed  Mobility Overal bed mobility: Needs Assistance             General bed mobility comments: Pt up in recliner    Transfers Overall transfer level: Needs assistance Equipment used: Rolling walker (2 wheels) Transfers: Sit to/from Stand Sit to Stand: Min assist           General transfer comment: Min A to power up and steady      Balance Overall balance assessment: Needs assistance Sitting-balance support: Feet supported Sitting balance-Leahy Scale: Fair     Standing balance support: Bilateral upper extremity supported;Reliant on assistive device for balance Standing balance-Leahy Scale: Poor                             ADL either performed or assessed with clinical judgement   ADL Overall ADL's : Needs assistance/impaired Eating/Feeding: Set up;Sitting   Grooming: Set up;Sitting   Upper Body Bathing: Minimal assistance;Sitting   Lower Body Bathing: Maximal assistance;Sitting/lateral leans;Sit to/from stand   Upper Body Dressing : Min guard;Sitting   Lower Body Dressing: Maximal assistance;Sitting/lateral leans;Sit to/from stand   Toilet Transfer: Minimal assistance;Ambulation   Toileting- Clothing Manipulation and Hygiene: Moderate assistance;Sit to/from stand;Sitting/lateral lean       Functional mobility during ADLs: Minimal assistance;Rolling walker (2 wheels) General ADL Comments: Pt limited by increased pain. Requiring increased assist for all ADL's.     Vision Baseline Vision/History: 1 Wears glasses Ability to See in Adequate Light: 0 Adequate Patient Visual Report: No change from baseline Vision Assessment?: No apparent visual deficits     Perception     Praxis      Pertinent Vitals/Pain Pain Assessment: 0-10 Pain Score: 8  Pain Location: L  knee Pain Descriptors / Indicators: Discomfort;Grimacing;Operative site guarding Pain Intervention(s): Limited activity within patient's tolerance;Monitored during session;Repositioned      Hand Dominance Left   Extremity/Trunk Assessment Upper Extremity Assessment Upper Extremity Assessment: Overall WFL for tasks assessed   Lower Extremity Assessment Lower Extremity Assessment: Defer to PT evaluation   Cervical / Trunk Assessment Cervical / Trunk Assessment: Normal   Communication Communication Communication: No difficulties   Cognition Arousal/Alertness: Awake/alert Behavior During Therapy: WFL for tasks assessed/performed Overall Cognitive Status: Within Functional Limits for tasks assessed                                       General Comments  VSS on RA, Bandage on knee dry and intact at end of session, ice applied    Exercises     Shoulder Instructions      Home Living Family/patient expects to be discharged to:: Private residence Living Arrangements: Children Available Help at Discharge: Family;Available PRN/intermittently Type of Home: Mobile home Home Access: Stairs to enter Entrance Stairs-Number of Steps: 5 Entrance Stairs-Rails: Right Home Layout: One level     Bathroom Shower/Tub: Teacher, early years/pre: Standard Bathroom Accessibility: Yes   Home Equipment: None          Prior Functioning/Environment Prior Level of Function : Needs assist             Mobility Comments: using 2 canes, then fell on Christmas, and since has been using an old walker ADLs Comments: reports difficulty, but remaining independent        OT Problem List: Decreased strength;Decreased range of motion;Decreased activity tolerance;Decreased knowledge of use of DME or AE;Pain      OT Treatment/Interventions: Therapeutic exercise;Self-care/ADL training;Energy conservation;DME and/or AE instruction;Therapeutic activities;Patient/family education;Balance training    OT Goals(Current goals can be found in the care plan section) Acute Rehab OT Goals Patient Stated Goal: To lessen pain OT Goal Formulation: With  patient Time For Goal Achievement: 11/01/21 Potential to Achieve Goals: Good ADL Goals Pt Will Perform Lower Body Bathing: with supervision;sit to/from stand;sitting/lateral leans Pt Will Perform Lower Body Dressing: with supervision;with adaptive equipment;sitting/lateral leans;sit to/from stand Pt Will Transfer to Toilet: with supervision;ambulating Pt Will Perform Toileting - Clothing Manipulation and hygiene: with supervision;sitting/lateral leans;sit to/from stand  OT Frequency: Min 2X/week    Co-evaluation              AM-PAC OT "6 Clicks" Daily Activity     Outcome Measure Help from another person eating meals?: A Little Help from another person taking care of personal grooming?: A Little Help from another person toileting, which includes using toliet, bedpan, or urinal?: A Lot Help from another person bathing (including washing, rinsing, drying)?: A Lot Help from another person to put on and taking off regular upper body clothing?: A Little Help from another person to put on and taking off regular lower body clothing?: A Lot 6 Click Score: 15   End of Session Equipment Utilized During Treatment: Rolling walker (2 wheels);Left knee immobilizer Nurse Communication: Mobility status  Activity Tolerance: Patient limited by pain Patient left: in chair;with call bell/phone within reach  OT Visit Diagnosis: Unsteadiness on feet (R26.81);Other abnormalities of gait and mobility (R26.89);Muscle weakness (generalized) (M62.81);Pain Pain - Right/Left: Left Pain - part of body: Knee                Time: 8341-9622 OT Time  Calculation (min): 20 min Charges:  OT General Charges $OT Visit: 1 Visit OT Evaluation $OT Eval Moderate Complexity: 1 Mod  Chelcee Korpi H., OTR/L Acute Rehabilitation  Myliyah Rebuck Elane Yolanda Bonine 10/18/2021, 10:17 AM

## 2021-10-18 NOTE — Progress Notes (Signed)
Physical Therapy Treatment Patient Details Name: [name] MRN: [id number] DOB: [birth year] Today's Date: 10/18/2021   History of Present Illness Pt is a 66 y.o. M who presents s/p left total knee arthroplasty 10/17/2021. Significant PMH: rotator cuff arthroplasty of both shoulders, HFpEF, ICD, COPD, atrial fibrillation, DM2, right TKA.    PT Comments    Pt progressing incrementally towards his physical therapy goals. RN present to reinforce superior aspect of dressing prior to mobility. Pt requiring min assist for transfers and ambulating 50 feet with a walker at a min guard assist level, utilizing a step to pattern. Pt continues with significant LLE weakness, so still used the knee immobilizer for gait. D/c plan remains appropriate.     Recommendations for follow up therapy are one component of a multi-disciplinary discharge planning process, led by the attending physician.  Recommendations may be updated based on patient status, additional functional criteria and insurance authorization.  Follow Up Recommendations  Home health PT     Assistance Recommended at Discharge PRN  Patient can return home with the following A little help with walking and/or transfers;Help with stairs or ramp for entrance;A little help with bathing/dressing/bathroom   Equipment Recommendations  Rolling walker (2 wheels);BSC/3in1    Recommendations for Other Services       Precautions / Restrictions Precautions Precautions: Fall Required Braces or Orthoses: Knee Immobilizer - Left Knee Immobilizer - Left: On when out of bed or walking Restrictions Weight Bearing Restrictions: No     Mobility  Bed Mobility Overal bed mobility: Needs Assistance             General bed mobility comments: Pt up in recliner    Transfers Overall transfer level: Needs assistance Equipment used: Rolling walker (2 wheels) Transfers: Sit to/from Stand Sit to Stand: Min assist           General transfer  comment: Min A to power up, increased time to rise, pt placing LLE anteriorly    Ambulation/Gait Ambulation/Gait assistance: Min guard Gait Distance (Feet): 50 Feet Assistive device: Rolling walker (2 wheels) Gait Pattern/deviations: Step-to pattern;Decreased stance time - left;Decreased weight shift to left;Antalgic Gait velocity: decreased Gait velocity interpretation: <1.8 ft/sec, indicate of risk for recurrent falls   General Gait Details: Cues for sequencing, technique, segmental turning. Min guard for safety. slow and effortful gait   Stairs             Wheelchair Mobility    Modified Rankin (Stroke Patients Only)       Balance Overall balance assessment: Needs assistance Sitting-balance support: Feet supported Sitting balance-Leahy Scale: Fair     Standing balance support: Bilateral upper extremity supported;Reliant on assistive device for balance Standing balance-Leahy Scale: Poor                              Cognition Arousal/Alertness: Awake/alert Behavior During Therapy: WFL for tasks assessed/performed Overall Cognitive Status: Within Functional Limits for tasks assessed                                          Exercises Total Joint Exercises Quad Sets: Both;10 reps;Seated Long Arc Quad: Left;AAROM;5 reps;Seated Knee Flexion: Left;5 reps;Seated Goniometric ROM: 10-71 degrees    General Comments        Pertinent Vitals/Pain Pain Assessment: Faces Faces Pain Scale: Hurts even more Pain  Location: L knee Pain Descriptors / Indicators: Discomfort;Grimacing;Operative site guarding Pain Intervention(s): Limited activity within patient's tolerance;Monitored during session    Home Living                          Prior Function            PT Goals (current goals can now be found in the care plan section) Acute Rehab PT Goals Patient Stated Goal: to walk Potential to Achieve Goals: Good Progress towards  PT goals: Progressing toward goals    Frequency    7X/week      PT Plan Current plan remains appropriate    Co-evaluation              AM-PAC PT "6 Clicks" Mobility   Outcome Measure  Help needed turning from your back to your side while in a flat bed without using bedrails?: A Little Help needed moving from lying on your back to sitting on the side of a flat bed without using bedrails?: A Little Help needed moving to and from a bed to a chair (including a wheelchair)?: A Little Help needed standing up from a chair using your arms (e.g., wheelchair or bedside chair)?: A Little Help needed to walk in hospital room?: A Little Help needed climbing 3-5 steps with a railing? : Total 6 Click Score: 16    End of Session Equipment Utilized During Treatment: Gait belt;Left knee immobilizer Activity Tolerance: Patient limited by fatigue Patient left: in chair;with call bell/phone within reach Nurse Communication: Mobility status PT Visit Diagnosis: Unsteadiness on feet (R26.81);Other abnormalities of gait and mobility (R26.89);History of falling (Z91.81);Muscle weakness (generalized) (M62.81);Difficulty in walking, not elsewhere classified (R26.2);Pain Pain - Right/Left: Left Pain - part of body: Knee     Time: 0102-7253 PT Time Calculation (min) (ACUTE ONLY): 33 min  Charges:  $Gait Training: 8-22 mins $Therapeutic Activity: 8-22 mins                     Wyona Almas, PT, DPT Acute Rehabilitation Services Pager [phone] Office [phone]    Deno Etienne 10/18/2021, 1:49 PM
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
## 2021-10-18 NOTE — Care Management Obs Status (Signed)
[place] NOTIFICATION   Patient Details  Name: [name] MRN: [id number] Date of Birth: [birth year]   Medicare Observation Status Notification Given:  Yes    Carles Collet, RN 10/18/2021, 3:03 PM

## 2021-10-19 ENCOUNTER — Encounter (HOSPITAL_COMMUNITY): Payer: Self-pay | Admitting: Orthopaedic Surgery

## 2021-10-19 DIAGNOSIS — I480 Paroxysmal atrial fibrillation: Secondary | ICD-10-CM | POA: Diagnosis not present

## 2021-10-19 DIAGNOSIS — E119 Type 2 diabetes mellitus without complications: Secondary | ICD-10-CM | POA: Diagnosis not present

## 2021-10-19 DIAGNOSIS — I5033 Acute on chronic diastolic (congestive) heart failure: Secondary | ICD-10-CM | POA: Diagnosis not present

## 2021-10-19 DIAGNOSIS — G8929 Other chronic pain: Secondary | ICD-10-CM | POA: Diagnosis not present

## 2021-10-19 DIAGNOSIS — J449 Chronic obstructive pulmonary disease, unspecified: Secondary | ICD-10-CM | POA: Diagnosis not present

## 2021-10-19 DIAGNOSIS — J45909 Unspecified asthma, uncomplicated: Secondary | ICD-10-CM | POA: Diagnosis not present

## 2021-10-19 DIAGNOSIS — Z7901 Long term (current) use of anticoagulants: Secondary | ICD-10-CM | POA: Diagnosis not present

## 2021-10-19 DIAGNOSIS — M1712 Unilateral primary osteoarthritis, left knee: Secondary | ICD-10-CM | POA: Diagnosis not present

## 2021-10-19 DIAGNOSIS — I110 Hypertensive heart disease with heart failure: Secondary | ICD-10-CM | POA: Diagnosis not present

## 2021-10-19 LAB — GLUCOSE, CAPILLARY
Glucose-Capillary: 91 mg/dL (ref 70–99)
Glucose-Capillary: 97 mg/dL (ref 70–99)

## 2021-10-19 LAB — CBC
HCT: 33.6 % — ABNORMAL LOW (ref 39.0–52.0)
Hemoglobin: 10.9 g/dL — ABNORMAL LOW (ref 13.0–17.0)
MCH: 31.4 pg (ref 26.0–34.0)
MCHC: 32.4 g/dL (ref 30.0–36.0)
MCV: 96.8 fL (ref 80.0–100.0)
Platelets: 188 10*3/uL (ref 150–400)
RBC: 3.47 MIL/uL — ABNORMAL LOW (ref 4.22–5.81)
RDW: 15.8 % — ABNORMAL HIGH (ref 11.5–15.5)
WBC: 12 10*3/uL — ABNORMAL HIGH (ref 4.0–10.5)
nRBC: 0 % (ref 0.0–0.2)

## 2021-10-19 MED ORDER — OXYCODONE HCL 5 MG PO TABS: 5.0000 mg | ORAL_TABLET | ORAL | 0 refills | Status: DC | PRN

## 2021-10-19 NOTE — TOC Transition Note (Addendum)
Transition of Care [facility]) - CM/SW Discharge Note   Patient Details  Name: [name] MRN: [id number] Date of Birth: [birth year]  Transition of [facility]) CM/SW Contact:  Verdell Carmine, RN Phone Number: 10/19/2021, 4:53 PM   Clinical Narrative:    Discharge summary by PA earlier in day, no face to face [place] orders in Called on call PA- she directed me to Dr. Isadore Miu . Called Dr Ninfa Linden, who stated that his Care manger in the office handles all [facility]   Final next level of care: [place] Barriers to Discharge: No Barriers Identified   Patient Goals and CMS Choice Patient states their goals for this hospitalization and ongoing recovery are:: to go home      Discharge Placement                       Discharge Plan and Services   Discharge Planning Services: CM Consult Post Acute Care Choice: Home Health, Durable Medical Equipment          DME Arranged: 3-N-1, Walker rolling       Representative spoke with at DME Agency: [facility] HH Arranged: PT, OT [place] Agency: [place] Date [place]: 10/18/21 Time [place]: 1159 Representative spoke with at [place]: [place] (SDOH) Interventions     Readmission Risk Interventions No flowsheet data found.

## 2021-10-19 NOTE — Discharge Summary (Signed)
Patient ID: [name] MRN: [id number] DOB/AGE: 67-30-1957 67 y.o.  Admit date: 10/17/2021 Discharge date: 10/19/2021  Admission Diagnoses:  Principal Problem:   Unilateral primary osteoarthritis, left knee Active Problems:   Status post total knee replacement, left   Discharge Diagnoses:  Status post left total knee arthroplasty  Past Medical History:  Diagnosis Date   AICD (automatic cardioverter/defibrillator) present    Anemia    supposed to be taking Vit B but doesn't   ANXIETY    takes Xanax nightly   Arthritis    Asthma    Albuterol prn and Advair daily;also takes Prednisone daily   Atrial fibrillation ([place]) 09/06/2015   Cardiomyopathy ([place])    a. EF 25% TEE July 2013; b. EF normalized 2015;  c. 03/2015 Echo: EF 40-45%, difrf HK, PASP 38 mmHg, Mild MR, sev LAE/RAE.   CHF (congestive heart failure) (HCC)    Chronic constipation    takes OTC stool softener   COPD (chronic obstructive pulmonary disease) (HCC)    "one dr says COPD; one dr says emphysema" (09/18/2017)   DEPRESSION    takes Zoloft and Doxepin daily   Diverticulitis    DYSKINESIA, ESOPHAGUS    Dysrhythmia    Atrial Fibrilation   Emphysema of lung [facility])    "one dr says COPD; one dr says emphysema" (09/18/2017)   Essential hypertension        FIBROMYALGIA    GERD (gastroesophageal reflux disease)        Glaucoma    HYPERLIPIDEMIA    a. Intolerant to statins.   INSOMNIA    takes Ambien nightly   Myocardial infarction [facility])    a. 2012 Myoview notable for prior infarct;  b. 03/2015 Lexiscan CL: EF 37%, diff HK, small area of inferior infarct from apex to base-->Med Rx.   Myocardial infarction ([place])    O2 dependent    "2.5L q hs & prn" (09/18/2017)   Paroxysmal atrial fibrillation ([place])    a. CHA2DS2VASc = 3--> takes Coumadin;  b. 03/15/2015 Successful TEE/DCCV;  c. 03/2015 recurrent afib, Amio d/c'd in setting of hyperthyroidism.   Peripheral neuropathy    Pneumonia 12/2016   Rash and other nonspecific  skin eruption 04/12/2009   no cause found saw dermatologists x 2 and allergist   SLEEP APNEA, OBSTRUCTIVE    a. doesn't use CPAP   Syncope    a. 03/2015 s/p MDT LINQ.   Type II diabetes mellitus (HCC)         Surgeries: Procedure(s): Left TOTAL KNEE ARTHROPLASTY on 10/17/2021   Consultants:   Discharged Condition: Improved  Hospital Course: [name] is an 66 y.o. male who was admitted 10/17/2021 for operative treatment ofUnilateral primary osteoarthritis, left knee. Patient has severe unremitting pain that affects sleep, daily activities, and work/hobbies. After pre-op clearance the patient was taken to the operating room on 10/17/2021 and underwent  Procedure(s): Left TOTAL KNEE ARTHROPLASTY.    Patient was given perioperative antibiotics:  Anti-infectives (From admission, onward)    Start     Dose/Rate Route Frequency Ordered Stop   10/17/21 1730  ceFAZolin (ANCEF) IVPB 1 g/50 mL premix        1 g 100 mL/hr over 30 Minutes Intravenous Every 6 hours 10/17/21 1633 10/17/21 2300   10/17/21 0800  ceFAZolin (ANCEF) IVPB 2g/100 mL premix        2 g 200 mL/hr over 30 Minutes Intravenous On call to O.R. 10/17/21 5597 10/17/21 1035  Patient was given sequential compression devices, early ambulation, and chemoprophylaxis to prevent DVT.  Patient benefited maximally from hospital stay and there were no complications.    Recent vital signs: Patient Vitals for the past 24 hrs:  BP Temp Temp src Pulse Resp SpO2  10/19/21 0755 (!) 134/107 98.2 F (36.8 C) Oral 70 18 95 %  10/19/21 0330 (!) 100/53 98 F (36.7 C) Oral 71 20 98 %  10/18/21 2307 (!) 107/56 98.4 F (36.9 C) Oral 77 20 99 %  10/18/21 1948 (!) 116/53 98.3 F (36.8 C) Oral 70 20 96 %  10/18/21 1658 (!) 97/47 98.3 F (36.8 C) Oral 70 17 98 %  10/18/21 1300 (!) 98/56 97.8 F (36.6 C) Oral 70 16 96 %     Recent laboratory studies:  Recent Labs    10/18/21 0710 10/19/21 0657  WBC 13.6* 12.0*  HGB 12.0* 10.9*   HCT 38.4* 33.6*  PLT 217 188  NA 136  --   K 5.0  --   CL 104  --   CO2 25  --   BUN 23  --   CREATININE 1.27*  --   GLUCOSE 125*  --   CALCIUM 9.3  --      Discharge Medications:   Allergies as of 10/19/2021       Reactions   Amiodarone Other (See Comments)   hyperthyroidism   Statins Other (See Comments)   myalgia   Tape Other (See Comments)   Skin Tears Use Paper Tape Only        Medication List     STOP taking these medications    traMADol 50 MG tablet Commonly known as: ULTRAM       TAKE these medications    acetaminophen 500 MG tablet Commonly known as: TYLENOL Take 500 mg by mouth every 6 (six) hours as needed for moderate pain.   albuterol 108 (90 Base) MCG/ACT inhaler Commonly known as: VENTOLIN HFA INHALE 2 PUFFS BY MOUTH 4 TIMES A DAY AS NEEDED   ALPRAZolam 0.5 MG tablet Commonly known as: XANAX TAKE 2 TABLETS BY MOUTH AT BEDTIME   AMBULATORY NON FORMULARY MEDICATION Rolling walker with a bench   apixaban 5 MG Tabs tablet Commonly known as: ELIQUIS Take 1 tablet (5 mg total) by mouth 2 (two) times daily.   augmented betamethasone dipropionate 0.05 % cream Commonly known as: DIPROLENE-AF Apply 1 application topically daily as needed (itching).   carisoprodol 350 MG tablet Commonly known as: SOMA TAKE 1 TABLET BY MOUTH THREE TIMES A DAY AS NEEDED FOR MUSCLE SPASMS   carvedilol 6.25 MG tablet Commonly known as: Coreg Take 1 tablet (6.25 mg total) by mouth 2 (two) times daily.   Cholecalciferol 100 MCG (4000 UT) Caps 1 tab by mouth once daily   dapagliflozin propanediol 10 MG Tabs tablet Commonly known as: Farxiga Take 1 tablet (10 mg total) by mouth daily before breakfast.   doxepin 10 MG capsule Commonly known as: SINEQUAN TAKE 2 CAPSULES (20 MG TOTAL) BY MOUTH AT BEDTIME AS NEEDED.   Dupixent 300 MG/2ML Sopn Generic drug: Dupilumab Inject 300 mg into the skin every 14 (fourteen) days.   eplerenone 50 MG tablet Commonly  known as: Inspra Take 1 tablet (50 mg total) by mouth daily.   fluticasone 0.05 % cream Commonly known as: CUTIVATE SMARTSIG:Topical 1-2 Times Daily PRN   fluticasone 50 MCG/ACT nasal spray Commonly known as: FLONASE PLACE 2 SPRAYS INTO BOTH NOSTRILS DAILY AS NEEDED FOR  ALLERGIES OR RHINITIS.   hydrOXYzine 10 MG tablet Commonly known as: ATARAX TAKE 1 TABLET BY MOUTH 3 TIMES DAILY AS NEEDED FOR ITCHING.   ketoconazole 2 % cream Commonly known as: NIZORAL APPLY 1 APPLICATION TOPICALLY DAILY What changed:  when to take this reasons to take this   Klor-Con M20 20 MEQ tablet Generic drug: potassium chloride SA TAKE 1 TABLET BY MOUTH EVERY DAY What changed: how much to take   lidocaine 5 % Commonly known as: LIDODERM APPLY 1 PATCH TO AFFECTED AREA FOR UP TO 12 HOURS . DO NOT USE MORE THAN ONE PATCH IN 24 HOURS.   losartan 25 MG tablet Commonly known as: COZAAR Take 0.5 tablets (12.5 mg total) by mouth daily.   metolazone 2.5 MG tablet Commonly known as: ZAROXOLYN Take 1 tablet (2.5 mg total) by mouth as directed. With additional 40 meq of potassium What changed: when to take this   omeprazole 20 MG capsule Commonly known as: PRILOSEC TAKE 1 CAPSULE BY MOUTH TWICE A DAY   oxyCODONE 5 MG immediate release tablet Commonly known as: Oxy IR/ROXICODONE Take 1-2 tablets (5-10 mg total) by mouth every 4 (four) hours as needed for moderate pain (pain score 4-6).   Repatha SureClick 462 MG/ML Soaj Generic drug: Evolocumab INJECT 1 PEN INTO THE SKIN EVERY 14 (FOURTEEN) DAYS.   tamsulosin 0.4 MG Caps capsule Commonly known as: FLOMAX Take 0.4 mg by mouth daily.   torsemide 20 MG tablet Commonly known as: DEMADEX Take 60-80 mg by mouth See admin instructions. Take 80 mg by mouth in the morning and 60 mg in the evening.   traZODone 100 MG tablet Commonly known as: DESYREL 2 tab by mouth at bedtime as needed   triamcinolone 0.1 % paste Commonly known as: KENALOG Use as  directed in the mouth or throat 2 (two) times daily.   Tums 500 MG chewable tablet Generic drug: calcium carbonate Chew 500-2,000 mg by mouth every 4 (four) hours as needed for indigestion or heartburn.   venlafaxine XR 75 MG 24 hr capsule Commonly known as: EFFEXOR-XR Take 1 capsule (75 mg total) by mouth daily with breakfast.   zolpidem 10 MG tablet Commonly known as: AMBIEN TAKE 1 TABLET BY MOUTH EVERY DAY AT BEDTIME AS NEEDED               Durable Medical Equipment  (From admission, onward)           Start     Ordered   10/17/21 1220  DME 3 n 1  Once        10/17/21 1219   10/17/21 1220  DME Walker rolling  Once       Question Answer Comment  Walker: With 5 Inch Wheels   Patient needs a walker to treat with the following condition Status post total left knee replacement      10/17/21 1219            Diagnostic Studies: DG Ribs Unilateral W/Chest Right  Result Date: 10/10/2021 CLINICAL DATA:  Right anterior rib pain and bruising after falling 2 days ago. EXAM: RIGHT RIBS AND CHEST - 3+ VIEW COMPARISON:  Radiographs 05/05/2021.  CT 12/09/2019. FINDINGS: Left subclavian biventricular ICD leads are unchanged. The heart size and mediastinal contours are stable. There is mild atelectasis at both lung bases. No pleural effusion, pneumothorax or displaced acute rib fractures are identified. IMPRESSION: 1. No evidence of right-sided rib fracture, pleural effusion or pneumothorax. 2. Stable cardiomegaly and ICD  lead position. Electronically Signed   By: Richardean Sale M.D.   On: 10/10/2021 14:33   DG Knee Left Port  Result Date: 10/17/2021 CLINICAL DATA:  Post LEFT total knee arthroplasty EXAM: PORTABLE LEFT KNEE - 1-2 VIEW COMPARISON:  Portable exam 9357 hours compared to 10/10/2021 FINDINGS: Osseous demineralization. Components of LEFT knee prosthesis identified in expected positions. No acute fracture, dislocation, or bone destruction. IMPRESSION: LEFT knee prosthesis  without acute complication. Electronically Signed   By: Lavonia Dana M.D.   On: 10/17/2021 12:56   CUP PACEART INCLINIC DEVICE CHECK  Result Date: 10/06/2021 CRT-D device check in office. Thresholds and sensing consistent with previous device measurements. Lead impedance trends stable over time. VVI. No ventricular arrhythmia episodes recorded. Patient bi-ventricularly pacing 99.91% of the time. Device programmed with appropriate safety margins. Heart failure diagnostics reviewed and trends are stable for patient. Audible/vibratory alerts demonstrated for patient. No changes made this session. Estimated longevity 2 yr, 5 mo.  Patient enrolled in remote  follow up. Plan to check device remotely in 3 months and see in office in 6 months.  [place] LIMITED JOINT SPACE STRUCTURES LOW LEFT(NO LINKED CHARGES)  Result Date: 10/16/2021 No images saved   DG Knee AP/LAT W/[place] Left  Result Date: 10/10/2021 CLINICAL DATA:  Left knee pain and bruising 2 days after falling. EXAM: LEFT KNEE 3 VIEWS COMPARISON:  Radiographs 04/24/2021. FINDINGS: The bones are demineralized. There is no evidence of acute fracture or dislocation. There are advanced tricompartmental degenerative changes, most advanced in the medial compartment. In the medial compartment, there is bone-on-bone apposition, and there is a genu varus deformity and small knee joint effusion. IMPRESSION: Tricompartmental osteoarthritis and small joint effusion. No acute osseous findings. Electronically Signed   By: Richardean Sale M.D.   On: 10/10/2021 14:35    Disposition: Discharge disposition: 06-Home-Health Care Svc          Follow-up Information     Mcarthur Rossetti, MD Follow up in 2 week(s).   Specialty: Orthopedic Surgery Contact information: [place] [address] [place], [place] Follow up.   Specialty: [place] Why: For home health services Contact information: [place] [address] [phone]                  Signed: Erskine Emery 10/19/2021, 9:38 AM

## 2021-10-19 NOTE — Progress Notes (Signed)
Patient alert and oriented, voiding adequately, skin clean, dry and intact without evidence of skin break down, or symptoms of complications - no redness or edema noted, only slight tenderness at site. Patient discharge home per order with the fact that daughter and son-in-law will be around to help patient at home. Patient states pain is manageable at time of discharge. Patient has an appointment with MD.

## 2021-10-19 NOTE — Progress Notes (Signed)
Physical Therapy Treatment Patient Details Name: [name] MRN: [id number] DOB: 1955/11/16 Today's Date: 10/19/2021   History of Present Illness Pt is a 66 y.o. M who presents s/p left total knee arthroplasty 10/17/2021. Significant PMH: rotator cuff arthroplasty of both shoulders, HFpEF, ICD, COPD, atrial fibrillation, DM2, right TKA.    PT Comments    Pt progressing slowly towards physical therapy goals. With increased time and effort, pt was able to complete functional mobility with up to min assist for balance support and safety with RW for support. Pt reports concerns with return home however MD recommends home instead of SNF. Agreed focus of second PT session will be stair training to prepare for d/c home. Will continue to follow and progress as able per POC.    Recommendations for follow up therapy are one component of a multi-disciplinary discharge planning process, led by the attending physician.  Recommendations may be updated based on patient status, additional functional criteria and insurance authorization.  Follow Up Recommendations  Home health PT     Assistance Recommended at Discharge PRN  Patient can return home with the following A little help with walking and/or transfers;Help with stairs or ramp for entrance;A little help with bathing/dressing/bathroom;Assist for transportation   Equipment Recommendations  Rolling walker (2 wheels);BSC/3in1    Recommendations for Other Services       Precautions / Restrictions Precautions Precautions: Fall;Knee Precaution Booklet Issued: No Precaution Comments: Pt was educated on NO pillow, roll, ice pack, etc UNDER the knee. Only under heel to promote extension. Required Braces or Orthoses: Knee Immobilizer - Left Knee Immobilizer - Left: On when out of bed or walking Restrictions Weight Bearing Restrictions: No LLE Weight Bearing: Weight bearing as tolerated     Mobility  Bed Mobility               General bed  mobility comments: Pt was received up in the recliner.    Transfers Overall transfer level: Needs assistance Equipment used: Rolling walker (2 wheels) Transfers: Sit to/from Stand Sit to Stand: Min assist           General transfer comment: Min assist for power up to full standing position. Increased time required and pt with effort transition of hands from arm rests of the recliner to the walker.    Ambulation/Gait Ambulation/Gait assistance: Min guard Gait Distance (Feet): 100 Feet Assistive device: Rolling walker (2 wheels) Gait Pattern/deviations: Step-to pattern;Decreased stance time - left;Decreased weight shift to left;Antalgic;Step-through pattern;Decreased dorsiflexion - left Gait velocity: decreased Gait velocity interpretation: <1.31 ft/sec, indicative of household ambulator   General Gait Details: Slow and effortful. Pt complaining of fatigue and pain throughout gait training. Progressed to step-through gait pattern however with poor heel strike and weight bearing.   Stairs             Wheelchair Mobility    Modified Rankin (Stroke Patients Only)       Balance Overall balance assessment: Needs assistance Sitting-balance support: Feet supported Sitting balance-Leahy Scale: Fair     Standing balance support: Bilateral upper extremity supported;Reliant on assistive device for balance Standing balance-Leahy Scale: Poor                              Cognition Arousal/Alertness: Awake/alert Behavior During Therapy: WFL for tasks assessed/performed Overall Cognitive Status: Within Functional Limits for tasks assessed  Exercises Total Joint Exercises Ankle Circles/Pumps: Both;10 reps Heel Slides: Left;15 reps;Seated Straight Leg Raises: AAROM;Left;5 reps;Supine Long Arc Quad: Left;AAROM;5 reps;Seated (Eccentric lower)    General Comments        Pertinent Vitals/Pain Pain  Assessment: Faces Faces Pain Scale: Hurts whole lot Pain Location: L knee Pain Descriptors / Indicators: Discomfort;Grimacing;Operative site guarding;Sore Pain Intervention(s): Limited activity within patient's tolerance;Monitored during session;Repositioned    Home Living                          Prior Function            PT Goals (current goals can now be found in the care plan section) Acute Rehab PT Goals Patient Stated Goal: Return home PT Goal Formulation: With patient Time For Goal Achievement: 10/31/21 Potential to Achieve Goals: Good Progress towards PT goals: Progressing toward goals    Frequency    7X/week      PT Plan Current plan remains appropriate    Co-evaluation              AM-PAC PT "6 Clicks" Mobility   Outcome Measure  Help needed turning from your back to your side while in a flat bed without using bedrails?: A Little Help needed moving from lying on your back to sitting on the side of a flat bed without using bedrails?: A Little Help needed moving to and from a bed to a chair (including a wheelchair)?: A Little Help needed standing up from a chair using your arms (e.g., wheelchair or bedside chair)?: A Little Help needed to walk in hospital room?: A Little Help needed climbing 3-5 steps with a railing? : A Lot 6 Click Score: 17    End of Session Equipment Utilized During Treatment: Gait belt Activity Tolerance: Patient limited by fatigue Patient left: in chair;with call bell/phone within reach Nurse Communication: Mobility status PT Visit Diagnosis: Unsteadiness on feet (R26.81);Other abnormalities of gait and mobility (R26.89);History of falling (Z91.81);Muscle weakness (generalized) (M62.81);Difficulty in walking, not elsewhere classified (R26.2);Pain Pain - Right/Left: Left Pain - part of body: Knee     Time: 6073-7106 PT Time Calculation (min) (ACUTE ONLY): 23 min  Charges:  $Gait Training: 23-37 mins                      [name], PT, DPT Acute Rehabilitation Services Pager: [phone] Office: [phone]    [name] 10/19/2021, 10:42 AM

## 2021-10-19 NOTE — Progress Notes (Signed)
Occupational Therapy Treatment Patient Details Name: [name] MRN: [id number] DOB: 12-29-1955 Today's Date: 10/19/2021   History of present illness Pt is a 66 y.o. M who presents s/p left total knee arthroplasty 10/17/2021. Significant PMH: rotator cuff arthroplasty of both shoulders, HFpEF, ICD, COPD, atrial fibrillation, DM2, right TKA.   OT comments  Pt making progress with OT goals. This session pt was able to tolerate dressing, toileting, and short distances of functional mobility. He requires increased time and min guard to min A for basic ADL's and mobility. Continuing to recommend HHOT to maximize his independence once home. OT will follow up acutely.   Recommendations for follow up therapy are one component of a multi-disciplinary discharge planning process, led by the attending physician.  Recommendations may be updated based on patient status, additional functional criteria and insurance authorization.    Follow Up Recommendations  Home health OT    Assistance Recommended at Discharge Intermittent Supervision/Assistance  Patient can return home with the following  A little help with walking and/or transfers;A little help with bathing/dressing/bathroom;Assistance with cooking/housework;Help with stairs or ramp for entrance   Equipment Recommendations  BSC/3in1;Other (comment);Tub/shower bench (RW)    Recommendations for Other Services      Precautions / Restrictions Precautions Precautions: Fall;Knee Precaution Booklet Issued: No Precaution Comments: Pt was educated on NO pillow, roll, ice pack, etc UNDER the knee. Only under heel to promote extension. Required Braces or Orthoses: Knee Immobilizer - Left Knee Immobilizer - Left: On when out of bed or walking Restrictions Weight Bearing Restrictions: No LLE Weight Bearing: Weight bearing as tolerated       Mobility Bed Mobility               General bed mobility comments: Pt was received up in the recliner.     Transfers Overall transfer level: Needs assistance Equipment used: Rolling walker (2 wheels) Transfers: Sit to/from Stand Sit to Stand: Min assist           General transfer comment: Min assist for power up to full standing position. Increased time required and pt with effort transition of hands from arm rests of the recliner to the walker.     Balance Overall balance assessment: Needs assistance Sitting-balance support: Feet supported Sitting balance-Leahy Scale: Fair     Standing balance support: Bilateral upper extremity supported;Reliant on assistive device for balance Standing balance-Leahy Scale: Poor                             ADL either performed or assessed with clinical judgement   ADL Overall ADL's : Needs assistance/impaired     Grooming: Wash/dry hands;Min guard;Standing Grooming Details (indicate cue type and reason): completed at sink         Upper Body Dressing : Sitting;Independent Upper Body Dressing Details (indicate cue type and reason): Doffed hospital gown, donned Tshirt Lower Body Dressing: Minimal assistance;Sitting/lateral leans;Sit to/from stand Lower Body Dressing Details (indicate cue type and reason): Pt overall able to complete donning underwear, pants, and slip on shoes, needing min A for LLE due to pain in knee Toilet Transfer: Minimal assistance;Ambulation Toilet Transfer Details (indicate cue type and reason): Min A to power up from low toilet Toileting- Clothing Manipulation and Hygiene: Min guard;Sitting/lateral lean;Sit to/from stand Toileting - Clothing Manipulation Details (indicate cue type and reason): completed in bathroom, min guard for safety due to poor balance     Functional mobility during ADLs: Min  guard;Rolling walker (2 wheels) General ADL Comments: Pt limited by pain, takes his time    Extremity/Trunk Assessment              Vision       Perception     Praxis      Cognition  Arousal/Alertness: Awake/alert Behavior During Therapy: WFL for tasks assessed/performed Overall Cognitive Status: Within Functional Limits for tasks assessed                                            Exercises Exercises: Total Joint Total Joint Exercises Ankle Circles/Pumps: Both;10 reps Heel Slides: Left;15 reps;Seated Straight Leg Raises: AAROM;Left;5 reps;Supine Long Arc Quad: Left;AAROM;5 reps;Seated (Eccentric lower)   Shoulder Instructions       General Comments      Pertinent Vitals/ Pain       Pain Assessment: Faces Faces Pain Scale: Hurts whole lot Pain Location: L knee Pain Descriptors / Indicators: Discomfort;Grimacing;Operative site guarding;Sore Pain Intervention(s): Limited activity within patient's tolerance;Monitored during session;Repositioned  Home Living                                          Prior Functioning/Environment              Frequency  Min 2X/week        Progress Toward Goals  OT Goals(current goals can now be found in the care plan section)  Progress towards OT goals: Progressing toward goals  Acute Rehab OT Goals Patient Stated Goal: To be able to move better OT Goal Formulation: With patient Time For Goal Achievement: 11/01/21 Potential to Achieve Goals: Good ADL Goals Pt Will Perform Lower Body Bathing: with supervision;sit to/from stand;sitting/lateral leans Pt Will Perform Lower Body Dressing: with supervision;with adaptive equipment;sitting/lateral leans;sit to/from stand Pt Will Transfer to Toilet: with supervision;ambulating Pt Will Perform Toileting - Clothing Manipulation and hygiene: with supervision;sitting/lateral leans;sit to/from stand  Plan Discharge plan remains appropriate;Frequency remains appropriate    Co-evaluation                 AM-PAC OT "6 Clicks" Daily Activity     Outcome Measure   Help from another person eating meals?: None Help from another  person taking care of personal grooming?: A Little Help from another person toileting, which includes using toliet, bedpan, or urinal?: A Little Help from another person bathing (including washing, rinsing, drying)?: A Lot Help from another person to put on and taking off regular upper body clothing?: None Help from another person to put on and taking off regular lower body clothing?: A Little 6 Click Score: 19    End of Session Equipment Utilized During Treatment: Rolling walker (2 wheels);Gait belt  OT Visit Diagnosis: Unsteadiness on feet (R26.81);Other abnormalities of gait and mobility (R26.89);Muscle weakness (generalized) (M62.81);Pain Pain - Right/Left: Left Pain - part of body: Knee   Activity Tolerance Patient limited by pain   Patient Left in chair;with call bell/phone within reach   Nurse Communication Mobility status        Time: 9935-7017 OT Time Calculation (min): 25 min  Charges: OT General Charges $OT Visit: 1 Visit OT Treatments $Self Care/Home Management : 23-37 mins  Lem Peary H., OTR/L Acute Rehabilitation  Katrese Shell Elane Jessilynn Taft 10/19/2021, 1:05 PM

## 2021-10-19 NOTE — Progress Notes (Signed)
Physical Therapy Treatment Patient Details Name: [name] MRN: [id number] DOB: [birth year] Today's Date: 10/19/2021   History of Present Illness Pt is a 66 y.o. M who presents s/p left total knee arthroplasty 10/17/2021. Significant PMH: rotator cuff arthroplasty of both shoulders, HFpEF, ICD, COPD, atrial fibrillation, DM2, right TKA.    PT Comments    Pt progressing slowly towards physical therapy goals. Focus of session was stair training. Pt has 8 stairs he will need to be able to negotiate to enter his home. He was able to complete 1 stair during session and unable to progress any farther. We discussed several options to enter home. Pt asking about scooting up on his hips, however do not feel he will be able to stand up off the floor once at the top. Pt also reported both rotator cuffs are torn, so would not want anyone pulling on his arms to assist him up to standing. Gait belt provided. Pt not agreeable to ambulance transfer. At end of session pt was on the phone asking if the volunteer fire department would be willing to assist him in the house. Feel pt could benefit from another PT session to focus again on stair training. At this time feel pt is at a significant risk for falls. Will continue to follow.    Recommendations for follow up therapy are one component of a multi-disciplinary discharge planning process, led by the attending physician.  Recommendations may be updated based on patient status, additional functional criteria and insurance authorization.  Follow Up Recommendations  Home health PT     Assistance Recommended at Discharge PRN  Patient can return home with the following A little help with walking and/or transfers;Help with stairs or ramp for entrance;A little help with bathing/dressing/bathroom;Assist for transportation   Equipment Recommendations  Rolling walker (2 wheels);BSC/3in1    Recommendations for Other Services       Precautions / Restrictions  Precautions Precautions: Fall;Knee Precaution Booklet Issued: No Precaution Comments: Pt was educated on NO pillow, roll, ice pack, etc UNDER the knee. Only under heel to promote extension. Required Braces or Orthoses: Knee Immobilizer - Left Knee Immobilizer - Left: On when out of bed or walking Restrictions Weight Bearing Restrictions: No LLE Weight Bearing: Weight bearing as tolerated     Mobility  Bed Mobility               General bed mobility comments: Pt was received up in the recliner.    Transfers Overall transfer level: Needs assistance Equipment used: Rolling walker (2 wheels) Transfers: Sit to/from Stand Sit to Stand: Min guard;Min assist           General transfer comment: Increased time and effort to power up to full stand. Hands on gait belt for safety but physical assist not provided to stand. Assist was provided for controlled descent to chair though.    Ambulation/Gait Ambulation/Gait assistance: Min guard Gait Distance (Feet): 75 Feet Assistive device: Rolling walker (2 wheels) Gait Pattern/deviations: Step-to pattern;Decreased stance time - left;Decreased weight shift to left;Antalgic;Step-through pattern;Decreased dorsiflexion - left Gait velocity: decreased Gait velocity interpretation: <1.31 ft/sec, indicative of household ambulator   General Gait Details: Slow and effortful. Pt complaining of fatigue and pain throughout gait training. Pt ambulated to/from the stair well with seated rest break 1/2 way. VC's for increased heel strike, closer walker proximity, and improved posture.   Stairs Stairs: Yes Stairs assistance: Mod assist Stair Management: One rail Left;Sideways;Step to pattern Number of Stairs: 1 General  stair comments: Pt was able to complete 1 stair with mod assist but unable to progress any farther.   Wheelchair Mobility    Modified Rankin (Stroke Patients Only)       Balance Overall balance assessment: Needs  assistance Sitting-balance support: Feet supported Sitting balance-Leahy Scale: Fair     Standing balance support: Bilateral upper extremity supported;Reliant on assistive device for balance Standing balance-Leahy Scale: Poor                              Cognition Arousal/Alertness: Awake/alert Behavior During Therapy: WFL for tasks assessed/performed Overall Cognitive Status: Within Functional Limits for tasks assessed                                          Exercises Total Joint Exercises Ankle Circles/Pumps: Both;10 reps Heel Slides: Left;15 reps;Seated Hip ABduction/ADduction: 10 reps (isometric pillow squeeze) Straight Leg Raises: AAROM;Left;5 reps;Supine Long Arc Quad: Left;AAROM;5 reps;Seated (Eccentric lower)    General Comments        Pertinent Vitals/Pain Pain Assessment: Faces Faces Pain Scale: Hurts whole lot Pain Location: L knee Pain Descriptors / Indicators: Discomfort;Grimacing;Operative site guarding;Sore Pain Intervention(s): Limited activity within patient's tolerance;Monitored during session;Repositioned    Home Living                          Prior Function            PT Goals (current goals can now be found in the care plan section) Acute Rehab PT Goals Patient Stated Goal: Return home PT Goal Formulation: With patient Time For Goal Achievement: 10/31/21 Potential to Achieve Goals: Good Progress towards PT goals: Progressing toward goals    Frequency    7X/week      PT Plan Current plan remains appropriate    Co-evaluation              AM-PAC PT "6 Clicks" Mobility   Outcome Measure  Help needed turning from your back to your side while in a flat bed without using bedrails?: A Little Help needed moving from lying on your back to sitting on the side of a flat bed without using bedrails?: A Little Help needed moving to and from a bed to a chair (including a wheelchair)?: A Little Help  needed standing up from a chair using your arms (e.g., wheelchair or bedside chair)?: A Little Help needed to walk in hospital room?: A Little Help needed climbing 3-5 steps with a railing? : Total 6 Click Score: 16    End of Session Equipment Utilized During Treatment: Gait belt Activity Tolerance: Patient limited by fatigue Patient left: in chair;with call bell/phone within reach Nurse Communication: Mobility status PT Visit Diagnosis: Unsteadiness on feet (R26.81);Other abnormalities of gait and mobility (R26.89);History of falling (Z91.81);Muscle weakness (generalized) (M62.81);Difficulty in walking, not elsewhere classified (R26.2);Pain Pain - Right/Left: Left Pain - part of body: Knee     Time: 1420-1500 PT Time Calculation (min) (ACUTE ONLY): 40 min  Charges:  $Gait Training: 23-37 mins $Therapeutic Exercise: 8-22 mins                     Rolinda Roan, PT, DPT Acute Rehabilitation Services Pager: [phone] Office: [phone]    Thelma Comp 10/19/2021, 3:49 PM

## 2021-10-19 NOTE — Progress Notes (Signed)
Subjective: 2 Days Post-Op Procedure(s) (LRB): Left TOTAL KNEE ARTHROPLASTY (Left) Patient reports pain as moderate to severe. Slow progress with PT.   Objective: Vital signs in last 24 hours: Temp:  [97.8 F (36.6 C)-98.4 F (36.9 C)] 98.2 F (36.8 C) (01/05 0755) Pulse Rate:  [70-77] 70 (01/05 0755) Resp:  [16-20] 18 (01/05 0755) BP: (97-134)/(47-107) 134/107 (01/05 0755) SpO2:  [95 %-99 %] 95 % (01/05 0755)  Intake/Output from previous day: No intake/output data recorded. Intake/Output this shift: No intake/output data recorded.  Recent Labs    10/18/21 0710 10/19/21 0657  HGB 12.0* 10.9*   Recent Labs    10/18/21 0710 10/19/21 0657  WBC 13.6* 12.0*  RBC 3.90* 3.47*  HCT 38.4* 33.6*  PLT 217 188   Recent Labs    10/18/21 0710  NA 136  K 5.0  CL 104  CO2 25  BUN 23  CREATININE 1.27*  GLUCOSE 125*  CALCIUM 9.3   No results for input(s): LABPT, INR in the last 72 hours.  Dorsiflexion/Plantar flexion intact Incision: scant drainage Compartment soft   Assessment/Plan: 2 Days Post-Op Procedure(s) (LRB): Left TOTAL KNEE ARTHROPLASTY (Left) Up with therapy Discharge home with home health if he does well with PT this afternoon .       [name] 10/19/2021, 9:27 AM

## 2021-10-20 ENCOUNTER — Telehealth: Payer: Self-pay | Admitting: *Deleted

## 2021-10-20 NOTE — Progress Notes (Signed)
EPIC Encounter for ICM Monitoring  Patient Name: [name] is a 66 y.o. male Date: 10/20/2021 Primary Care Physican: [name], MD Primary Cardiologist: [name] Electrophysiologist: Vergie Living Pacing: 99.9%    08/01/2021 Weight: 243 lbs     Spoke with patient and heart failure questions reviewed.  Pt asymptomatic for fluid accumulation.  Pt had Total Knee replacement 10/17/2021 and doing reasonably well with recovery.  Does not feel like he has any fluid symptoms after surgery.   Optivol thoracic impedance suggesting normal fluid levels.     Prescribed:  Torsemide 20 mg to 4 tablets (80 mg total) every morning and 3 tablets (60 mg tota) every afternoon.   Potassium 20 mEq take 2 tablets (40 mEq total) daily   Labs: 10/18/2021 Creatinine 1.27, BUN 23, Potassium 5.0, Sodium 136, GFR >60 09/25/2022 Creatinine 1.13, BUN 20, Potassium 4.8, Sodium 138, GFR >60  09/15/2022 Creatinine 1.39, BUN 31, Potassium 4.3, Sodium 136, GFR 56  06/28/2021 Creatinine 1.14, BUN 22, Potassium 3.7, Sodium 137, GFR >60  A complete set of results can be found in Results Review.   Recommendations:   No changes and encouraged to call if experiencing any fluid symptoms.   Follow-up plan: ICM clinic phone appointment on 11/20/2021.   91 day device clinic remote transmission 10/25/2021.   EP/Cardiology Office Visits:   12/15/2021 with [facility].  Recall 02/22/2021 with Dr [name].    Copy of ICM check sent to Dr. [name].    3 month ICM trend: 10/17/2021.    12-14 Month ICM trend:     [name], RN 10/20/2021 2:03 PM

## 2021-10-20 NOTE — Progress Notes (Signed)
[place] [facility] [place] Phone: [phone] Subjective:   [name], am serving as a scribe for Dr. Hulan Saas. This visit occurred during the SARS-CoV-2 public health emergency.  Safety protocols were in place, including screening questions prior to the visit, additional usage of staff PPE, and extensive cleaning of exam room while observing appropriate contact time as indicated for disinfecting solutions.   I'm seeing this patient by the request  of:  [name], MD  CC: Bilateral shoulder pain  [id number]  08/22/2021 Bilateral injections given again today.  Tolerated the procedure well.  Patient does have end-stage osteoarthritic changes especially of the left shoulder.  Patient has responded well to the injection.  Discussed with patient icing regimen and home exercises.  Increase activity as tolerated.  Follow-up again in 2 months  Repeat injection given again today.  Patient does need a replacement but secondary to patient's cardiovascular risk factors at the moment may need to hold for now.  Patient is also on a blood thinner which does make any other type of treatments difficult.  We will consider the possibility of viscosupplementation but does seem to be responding better to the steroid injections over the course of time.  Discussed which activities to do which wants to avoid.  Increase activity slowly.  Follow-up with me again in 2 months  Updated 10/23/2021 [name] is a 66 y.o. male coming in with complaint of B shoulder pain. Pain increased due to using a walker after L TKR last week.  Known rotator cuff arthropathy.  Patient is having worsening pain at this time.  Patient describes the pain as a dull, throbbing aching sensation.  Patient states that it is affecting daily activities.  Patient is using a walker at the moment secondary to his knee replacement and putting more pressure on his shoulders.        Past Medical History:  Diagnosis Date   AICD (automatic cardioverter/defibrillator) present    Anemia    supposed to be taking Vit B but doesn't   ANXIETY    takes Xanax nightly   Arthritis    Asthma    Albuterol prn and Advair daily;also takes Prednisone daily   Atrial fibrillation ([place]) 09/06/2015   Cardiomyopathy ([place])    a. EF 25% TEE July 2013; b. EF normalized 2015;  c. 03/2015 Echo: EF 40-45%, difrf HK, PASP 38 mmHg, Mild MR, sev LAE/RAE.   CHF (congestive heart failure) (HCC)    Chronic constipation    takes OTC stool softener   COPD (chronic obstructive pulmonary disease) (HCC)    "one dr says COPD; one dr says emphysema" (09/18/2017)   DEPRESSION    takes Zoloft and Doxepin daily   Diverticulitis    DYSKINESIA, ESOPHAGUS    Dysrhythmia    Atrial Fibrilation   Emphysema of lung [facility])    "one dr says COPD; one dr says emphysema" (09/18/2017)   Essential hypertension        FIBROMYALGIA    GERD (gastroesophageal reflux disease)        Glaucoma    HYPERLIPIDEMIA    a. Intolerant to statins.   INSOMNIA    takes Ambien nightly   Myocardial infarction [facility])    a. 2012 Myoview notable for prior infarct;  b. 03/2015 Lexiscan CL: EF 37%, diff HK, small area of inferior infarct from apex to base-->Med Rx.   Myocardial infarction ([place])    O2 dependent    "  2.5L q hs & prn" (09/18/2017)   Paroxysmal atrial fibrillation ([place])    a. CHA2DS2VASc = 3--> takes Coumadin;  b. 03/15/2015 Successful TEE/DCCV;  c. 03/2015 recurrent afib, Amio d/c'd in setting of hyperthyroidism.   Peripheral neuropathy    Pneumonia 12/2016   Rash and other nonspecific skin eruption 04/12/2009   [name] cause found saw dermatologists x 2 and allergist   SLEEP APNEA, OBSTRUCTIVE    a. doesn't use CPAP   Syncope    a. 03/2015 s/p MDT LINQ.   Type II diabetes mellitus ([place])        Past Surgical History:  Procedure Laterality Date   ACNE CYST REMOVAL     2 on back    AV NODE ABLATION N/A 10/25/2017    Procedure: AV NODE ABLATION;  Surgeon: Deboraha Sprang, MD;  Location: [place] [facility];  Service: Cardiovascular;  Laterality: N/A;   BIV ICD INSERTION CRT-D N/A 09/18/2017   Procedure: BIV ICD INSERTION CRT-D;  Surgeon: Deboraha Sprang, MD;  Location: [place] [facility];  Service: Cardiovascular;  Laterality: N/A;   CARDIAC CATHETERIZATION N/A 03/21/2016   Procedure: Right/Left Heart Cath and Coronary Angiography;  Surgeon: Larey Dresser, MD;  Location: [place] [facility];  Service: Cardiovascular;  Laterality: N/A;   CARDIOVERSION  04/18/2012   Procedure: CARDIOVERSION;  Surgeon: Fay Records, MD;  Location: [place];  Service: Cardiovascular;  Laterality: N/A;   CARDIOVERSION  04/25/2012   Procedure: CARDIOVERSION;  Surgeon: Thayer Headings, MD;  Location: [place];  Service: Cardiovascular;  Laterality: N/A;   CARDIOVERSION  04/25/2012   Procedure: CARDIOVERSION;  Surgeon: Fay Records, MD;  Location: [place];  Service: Cardiovascular;  Laterality: N/A;   CARDIOVERSION  05/09/2012   Procedure: CARDIOVERSION;  Surgeon: Sherren Mocha, MD;  Location: [place];  Service: Cardiovascular;  Laterality: N/A;  changed from crenshaw to cooper by trish/leone-endo   CARDIOVERSION N/A 03/15/2015   Procedure: CARDIOVERSION;  Surgeon: Thayer Headings, MD;  Location: [place];  Service: Cardiovascular;  Laterality: N/A;   COLONOSCOPY     COLONOSCOPY WITH PROPOFOL N/A 10/21/2014   Procedure: COLONOSCOPY WITH PROPOFOL;  Surgeon: Ladene Artist, MD;  Location: [facility];  Service: Endoscopy;  Laterality: N/A;   EP IMPLANTABLE DEVICE N/A 04/06/2015   Procedure: Loop Recorder Insertion;  Surgeon: Evans Lance, MD;  Location: [place] [facility];  Service: Cardiovascular;  Laterality: N/A;   ESOPHAGOGASTRODUODENOSCOPY     JOINT REPLACEMENT     LOOP RECORDER REMOVAL N/A 09/18/2017   Procedure: LOOP RECORDER REMOVAL;  Surgeon: Deboraha Sprang, MD;  Location: [place] [facility];  Service: Cardiovascular;   Laterality: N/A;   RIGHT/LEFT HEART CATH AND CORONARY ANGIOGRAPHY N/A 01/28/2017   Procedure: Right/Left Heart Cath and Coronary Angiography;  Surgeon: Larey Dresser, MD;  Location: [place] [facility];  Service: Cardiovascular;  Laterality: N/A;   TEE WITHOUT CARDIOVERSION  04/25/2012   Procedure: TRANSESOPHAGEAL ECHOCARDIOGRAM (TEE);  Surgeon: Thayer Headings, MD;  Location: [place];  Service: Cardiovascular;  Laterality: N/A;   TEE WITHOUT CARDIOVERSION N/A 03/15/2015   Procedure: TRANSESOPHAGEAL ECHOCARDIOGRAM (TEE);  Surgeon: Thayer Headings, MD;  Location: [place];  Service: Cardiovascular;  Laterality: N/A;   TONSILLECTOMY AND ADENOIDECTOMY     TOTAL KNEE ARTHROPLASTY Right 06/15/2014   Procedure: TOTAL KNEE ARTHROPLASTY;  Surgeon: Renette Butters, MD;  Location: [place];  Service: Orthopedics;  Laterality: Right;   TOTAL KNEE ARTHROPLASTY Left 10/17/2021  Procedure: Left TOTAL KNEE ARTHROPLASTY;  Surgeon: Mcarth[place] Rossetti, MD;  Location: [place];  Service: Orthopedics;  Laterality: Left;   Social History   Socioeconomic History   Marital status: Divorced    Spouse name: Not on file   Number of children: 2   Years of education: Not on file   Highest education level: Not on file  Occupational History   Occupation: retired/disabled. prev worked in Therapist, sports.    Employer: DISABLED  Tobacco Use   Smoking status: Former    Packs/day: 2.00    Years: 30.00    Pack years: 60.00    Types: Cigarettes    Quit date: 10/16/2007    Years since quitting: 14.0   Smokeless tobacco: Never  Vaping Use   Vaping Use: Never used  Substance and Sexual Activity   Alcohol use: [name]   Drug use: [name]   Sexual activity: Not Currently  Other Topics Concern   Not on file  Social History Narrative   Lives alone.   Social Determinants of Health   Financial Resource Strain: Low Risk    Difficulty of Paying Living Expenses: Not hard at all  Food Insecurity: [name] Food Insecurity    Worried About Charity fundraiser in the Last Year: Never true   [place] in the Last Year: Never true  Transportation Needs: [name] Transportation Needs   Lack of Transportation (Medical): [name]   Lack of Transportation (Non-Medical): [name]  Physical Activity: Inactive   Days of Exercise per Week: 0 days   Minutes of Exercise per Session: 0 min  Stress: [name] Stress Concern Present   Feeling of Stress : Not at all  Social Connections: Moderately Integrated   Frequency of Communication with Friends and Family: More than three times a week   Frequency of Social Gatherings with Friends and Family: More than three times a week   Attends Religious Services: More than 4 times per year   Active Member of [employer] or Organizations: Yes   Attends Music therapist: More than 4 times per year   Marital Status: Never married   Allergies  Allergen Reactions   Amiodarone Other (See Comments)    hyperthyroidism   Statins Other (See Comments)    myalgia   Tape Other (See Comments)    Skin Tears Use Paper Tape Only   Family History  Problem Relation Age of Onset   COPD Mother    Asthma Mother    Colon polyps Mother    Allergies Mother    Hypothyroidism Mother    Asthma Maternal Grandmother    Colon cancer Neg Hx     Current Outpatient Medications (Endocrine & Metabolic):    dapagliflozin propanediol (FARXIGA) 10 MG TABS tablet, Take 1 tablet (10 mg total) by mouth daily before breakfast.  Current Outpatient Medications (Cardiovascular):    carvedilol (COREG) 6.25 MG tablet, Take 1 tablet (6.25 mg total) by mouth 2 (two) times daily.   eplerenone (INSPRA) 50 MG tablet, Take 1 tablet (50 mg total) by mouth daily.   Evolocumab (REPATHA SURECLICK) 409 MG/ML SOAJ, INJECT 1 PEN INTO THE SKIN EVERY 14 (FOURTEEN) DAYS.   torsemide (DEMADEX) 20 MG tablet, Take 60-80 mg by mouth See admin instructions. Take 80 mg by mouth in the morning and 60 mg in the evening.   losartan (COZAAR) 25 MG  tablet, Take 0.5 tablets (12.5 mg total) by mouth daily.   metolazone (ZAROXOLYN) 2.5 MG tablet, Take 1 tablet (  2.5 mg total) by mouth as directed. With additional 40 meq of potassium (Patient taking differently: Take 2.5 mg by mouth daily. With additional 40 meq of potassium)  Current Outpatient Medications (Respiratory):    albuterol (VENTOLIN HFA) 108 (90 Base) MCG/ACT inhaler, INHALE 2 PUFFS BY MOUTH 4 TIMES A DAY AS NEEDED   fluticasone (FLONASE) 50 MCG/ACT nasal spray, PLACE 2 SPRAYS INTO BOTH NOSTRILS DAILY AS NEEDED FOR ALLERGIES OR RHINITIS.  Current Outpatient Medications (Analgesics):    acetaminophen (TYLENOL) 500 MG tablet, Take 500 mg by mouth every 6 (six) hours as needed for moderate pain.   oxyCODONE (OXY IR/ROXICODONE) 5 MG immediate release tablet, Take 1-2 tablets (5-10 mg total) by mouth every 4 (four) hours as needed for moderate pain (pain score 4-6).  Current Outpatient Medications (Hematological):    apixaban (ELIQUIS) 5 MG TABS tablet, Take 1 tablet (5 mg total) by mouth 2 (two) times daily.  Current Outpatient Medications (Other):    ALPRAZolam (XANAX) 0.5 MG tablet, TAKE 2 TABLETS BY MOUTH AT BEDTIME   AMBULATORY NON FORMULARY MEDICATION, Rolling walker with a bench   augmented betamethasone dipropionate (DIPROLENE-AF) 0.05 % cream, Apply 1 application topically daily as needed (itching).   carisoprodol (SOMA) 350 MG tablet, TAKE 1 TABLET BY MOUTH THREE TIMES A DAY AS NEEDED FOR MUSCLE SPASMS   Cholecalciferol 100 MCG (4000 UT) CAPS, 1 tab by mouth once daily   doxepin (SINEQUAN) 10 MG capsule, TAKE 2 CAPSULES (20 MG TOTAL) BY MOUTH AT BEDTIME AS NEEDED.   Dupilumab (DUPIXENT) 300 MG/2ML SOPN, Inject 300 mg into the skin every 14 (fourteen) days.   fluticasone (CUTIVATE) 0.05 % cream, SMARTSIG:Topical 1-2 Times Daily PRN   hydrOXYzine (ATARAX/VISTARIL) 10 MG tablet, TAKE 1 TABLET BY MOUTH 3 TIMES DAILY AS NEEDED FOR ITCHING.   ketoconazole (NIZORAL) 2 % cream,  APPLY 1 APPLICATION TOPICALLY DAILY (Patient taking differently: Apply 1 application topically daily as needed (dry skin).)   KLOR-CON M20 20 MEQ tablet, TAKE 1 TABLET BY MOUTH EVERY DAY (Patient taking differently: Take 40 mEq by mouth daily.)   lidocaine (LIDODERM) 5 %, APPLY 1 PATCH TO AFFECTED AREA FOR UP TO 12 HOURS . DO NOT USE MORE THAN ONE PATCH IN 24 HOURS.   omeprazole (PRILOSEC) 20 MG capsule, TAKE 1 CAPSULE BY MOUTH TWICE A DAY   tamsulosin (FLOMAX) 0.4 MG CAPS capsule, Take 0.4 mg by mouth daily.   traZODone (DESYREL) 100 MG tablet, 2 tab by mouth at bedtime as needed   triamcinolone (KENALOG) 0.1 % paste, Use as directed in the mouth or throat 2 (two) times daily.   TUMS 500 MG chewable tablet, Chew 500-2,000 mg by mouth every 4 (four) hours as needed for indigestion or heartburn.    venlafaxine XR (EFFEXOR-XR) 75 MG 24 hr capsule, Take 1 capsule (75 mg total) by mouth daily with breakfast.   zolpidem (AMBIEN) 10 MG tablet, TAKE 1 TABLET BY MOUTH EVERY DAY AT BEDTIME AS NEEDED   Reviewed prior external information including notes and imaging from  primary care provider As well as notes that were available from care everywhere and other healthcare systems.  Past medical history, social, surgical and family history all reviewed in electronic medical record.  [name] pertanent information unless stated regarding to the chief complaint.   Review of Systems:  [name] headache, visual changes, nausea, vomiting, diarrhea, constipation, dizziness, abdominal pain, skin rash, fevers, chills, night sweats, weight loss, swollen lymph nodes, joint swelling, chest pain, shortness of breath,  mood changes. POSITIVE muscle aches, body aches  Objective  Blood pressure 102/68, pulse 72, height 5\' 10"  (1.778 m), weight 249 lb (112.9 kg), SpO2 99 %.   General: [name] apparent distress alert and oriented x3 mood and affect normal, dressed appropriately.  HEENT: Pupils equal, extraocular movements intact   Respiratory: Patient's speak in full sentences and does not appear short of breath  Cardiovascular: [name] lower extremity edema, non tender, [name] erythema  Gait normal with good balance and coordination.  MSK: Bilateral shoulder exam shows the patient still has some crepitus noted.  Some limited range of motion noted.  Patient does have 3-5 strength of the rotator cuff bilaterally.  Procedure: Real-time Ultrasound Guided Injection of right glenohumeral joint Device: GE Logiq Q7  Ultrasound guided injection is preferred based studies that show increased duration, increased effect, greater accuracy, decreased procedural pain, increased response rate with ultrasound guided versus blind injection.  Verbal informed consent obtained.  Time-out conducted.  Noted [name] overlying erythema, induration, or other signs of local infection.  Skin prepped in a sterile fashion.  Local anesthesia: Topical Ethyl chloride.  With sterile technique and under real time ultrasound guidance:  Joint visualized.  23g 1  inch needle inserted posterior approach. Pictures taken for needle placement. Patient did have injection of 2 cc of 1% lidocaine, 2 cc of 0.5% Marcaine, and 1.0 cc of Kenalog 40 mg/dL. Completed without difficulty  Pain immediately resolved suggesting accurate placement of the medication.  Advised to call if fevers/chills, erythema, induration, drainage, or persistent bleeding.  Impression: Technically successful ultrasound guided injection.  Procedure: Real-time Ultrasound Guided Injection of left glenohumeral joint Device: GE Logiq E  Ultrasound guided injection is preferred based studies that show increased duration, increased effect, greater accuracy, decreased procedural pain, increased response rate with ultrasound guided versus blind injection.  Verbal informed consent obtained.  Time-out conducted.  Noted [name] overlying erythema, induration, or other signs of local infection.  Skin prepped in a  sterile fashion.  Local anesthesia: Topical Ethyl chloride.  With sterile technique and under real time ultrasound guidance:  Joint visualized.  21g 2 inch needle inserted posterior approach. Pictures taken for needle placement. Patient did have injection of 2 cc of 0.5% Marcaine, and 1cc of Kenalog 40 mg/dL. Completed without difficulty  Pain immediately resolved suggesting accurate placement of the medication.  Advised to call if fevers/chills, erythema, induration, drainage, or persistent bleeding.  Impression: Technically successful ultrasound guided injection.   Impression and Recommendations:     The above documentation has been reviewed and is accurate and complete Lyndal Pulley, DO

## 2021-10-20 NOTE — Telephone Encounter (Signed)
Attempted Ortho bundle D/C call to patient. No answer and left VM requesting call back.

## 2021-10-20 NOTE — Telephone Encounter (Signed)
Ortho bundle D/C call completed. 

## 2021-10-24 ENCOUNTER — Ambulatory Visit: Payer: Self-pay

## 2021-10-24 ENCOUNTER — Other Ambulatory Visit: Payer: Self-pay

## 2021-10-24 ENCOUNTER — Ambulatory Visit: Payer: Medicare HMO | Admitting: Family Medicine

## 2021-10-24 ENCOUNTER — Encounter: Payer: Self-pay | Admitting: Family Medicine

## 2021-10-24 VITALS — BP 102/68 | HR 72 | Ht 70.0 in | Wt 249.0 lb

## 2021-10-24 DIAGNOSIS — M25511 Pain in right shoulder: Secondary | ICD-10-CM

## 2021-10-24 DIAGNOSIS — M12811 Other specific arthropathies, not elsewhere classified, right shoulder: Secondary | ICD-10-CM

## 2021-10-24 DIAGNOSIS — M25512 Pain in left shoulder: Secondary | ICD-10-CM

## 2021-10-24 DIAGNOSIS — M12812 Other specific arthropathies, not elsewhere classified, left shoulder: Secondary | ICD-10-CM

## 2021-10-24 DIAGNOSIS — G8929 Other chronic pain: Secondary | ICD-10-CM

## 2021-10-24 NOTE — Patient Instructions (Signed)
See you again in 2-3 months

## 2021-10-24 NOTE — Assessment & Plan Note (Signed)
Bilateral injections given today.  Tolerated the procedure well, patient does not need to be able to use his arms under direct with patient having the knee replacement recently.  Do not want him to take the prednisone with him being on the blood thinner as well as recent knee replacement.  At this moment patient will increase activity as tolerated.  Patient will follow up with me again in 6 to 8 weeks

## 2021-10-25 ENCOUNTER — Ambulatory Visit (INDEPENDENT_AMBULATORY_CARE_PROVIDER_SITE_OTHER): Payer: Medicare HMO

## 2021-10-25 DIAGNOSIS — I5022 Chronic systolic (congestive) heart failure: Secondary | ICD-10-CM

## 2021-10-25 LAB — CUP PACEART REMOTE DEVICE CHECK
Battery Remaining Longevity: 29 mo
Battery Voltage: 2.95 V
Brady Statistic AP VP Percent: 0 %
Brady Statistic AP VS Percent: 0 %
Brady Statistic AS VP Percent: 0 %
Brady Statistic AS VS Percent: 0 %
Brady Statistic RA Percent Paced: 0 %
Brady Statistic RV Percent Paced: 99.63 %
Date Time Interrogation Session: 20230109043724
HighPow Impedance: 84 Ohm
Implantable Lead Implant Date: 20181205
Implantable Lead Implant Date: 20181205
Implantable Lead Location: 753858
Implantable Lead Location: 753860
Implantable Lead Model: 4398
Implantable Pulse Generator Implant Date: 20181205
Lead Channel Impedance Value: 1064 Ohm
Lead Channel Impedance Value: 1083 Ohm
Lead Channel Impedance Value: 1083 Ohm
Lead Channel Impedance Value: 1121 Ohm
Lead Channel Impedance Value: 241.412
Lead Channel Impedance Value: 279.484
Lead Channel Impedance Value: 279.484
Lead Channel Impedance Value: 299.908
Lead Channel Impedance Value: 299.908
Lead Channel Impedance Value: 304 Ohm
Lead Channel Impedance Value: 361 Ohm
Lead Channel Impedance Value: 4047 Ohm
Lead Channel Impedance Value: 456 Ohm
Lead Channel Impedance Value: 513 Ohm
Lead Channel Impedance Value: 722 Ohm
Lead Channel Impedance Value: 722 Ohm
Lead Channel Impedance Value: 817 Ohm
Lead Channel Impedance Value: 874 Ohm
Lead Channel Pacing Threshold Amplitude: 0.375 V
Lead Channel Pacing Threshold Pulse Width: 0.4 ms
Lead Channel Sensing Intrinsic Amplitude: 12.875 mV
Lead Channel Sensing Intrinsic Amplitude: 12.875 mV
Lead Channel Setting Pacing Amplitude: 2.25 V
Lead Channel Setting Pacing Amplitude: 2.5 V
Lead Channel Setting Pacing Pulse Width: 0.4 ms
Lead Channel Setting Pacing Pulse Width: 0.6 ms
Lead Channel Setting Sensing Sensitivity: 0.3 mV

## 2021-10-26 ENCOUNTER — Other Ambulatory Visit: Payer: Self-pay | Admitting: *Deleted

## 2021-10-26 ENCOUNTER — Telehealth: Payer: Self-pay | Admitting: *Deleted

## 2021-10-26 ENCOUNTER — Other Ambulatory Visit: Payer: Self-pay | Admitting: Orthopaedic Surgery

## 2021-10-26 DIAGNOSIS — Z96652 Presence of left artificial knee joint: Secondary | ICD-10-CM

## 2021-10-26 DIAGNOSIS — M1712 Unilateral primary osteoarthritis, left knee: Secondary | ICD-10-CM

## 2021-10-26 MED ORDER — OXYCODONE HCL 5 MG PO TABS: 5.0000 mg | ORAL_TABLET | ORAL | 0 refills | Status: DC | PRN

## 2021-10-26 NOTE — Telephone Encounter (Signed)
Ortho bundle call to patient; requesting refill of pain medication. Thank you.

## 2021-10-31 ENCOUNTER — Encounter: Payer: Medicare HMO | Admitting: Orthopaedic Surgery

## 2021-10-31 ENCOUNTER — Other Ambulatory Visit: Payer: Self-pay | Admitting: Cardiology

## 2021-11-01 ENCOUNTER — Ambulatory Visit: Payer: Medicare HMO | Admitting: Physical Therapy

## 2021-11-01 ENCOUNTER — Ambulatory Visit (INDEPENDENT_AMBULATORY_CARE_PROVIDER_SITE_OTHER): Payer: Medicare HMO | Admitting: Orthopaedic Surgery

## 2021-11-01 ENCOUNTER — Telehealth: Payer: Self-pay | Admitting: *Deleted

## 2021-11-01 ENCOUNTER — Encounter: Payer: Self-pay | Admitting: Orthopaedic Surgery

## 2021-11-01 DIAGNOSIS — Z96652 Presence of left artificial knee joint: Secondary | ICD-10-CM

## 2021-11-01 NOTE — Telephone Encounter (Signed)
Ortho bundle 14 day call completed. 

## 2021-11-01 NOTE — Progress Notes (Signed)
The patient is now 2 weeks status post a left total knee arthroplasty.  He is scheduled to start outpatient therapy tomorrow he is finished home therapy.  His staples were removed from the left knee and Steri-Strips applied.  He actually has pretty good motion of his knee today with almost full extension and I can flex him to just past 90 degrees.  There is some edema in his leg.  He is on Eliquis and he will continue that because that is something he has been on long-term.  I have recommended compressive socks for him or knee-high's.  We will see him back in 4 weeks to see how he is doing overall.  All questions and concerns were answered and addressed.

## 2021-11-02 ENCOUNTER — Ambulatory Visit (INDEPENDENT_AMBULATORY_CARE_PROVIDER_SITE_OTHER): Payer: Medicare HMO | Admitting: Physical Therapy

## 2021-11-02 ENCOUNTER — Other Ambulatory Visit: Payer: Self-pay

## 2021-11-02 ENCOUNTER — Other Ambulatory Visit: Payer: Self-pay | Admitting: Internal Medicine

## 2021-11-02 ENCOUNTER — Encounter: Payer: Self-pay | Admitting: Physical Therapy

## 2021-11-02 DIAGNOSIS — R600 Localized edema: Secondary | ICD-10-CM

## 2021-11-02 DIAGNOSIS — R2681 Unsteadiness on feet: Secondary | ICD-10-CM

## 2021-11-02 DIAGNOSIS — M25562 Pain in left knee: Secondary | ICD-10-CM

## 2021-11-02 DIAGNOSIS — M6281 Muscle weakness (generalized): Secondary | ICD-10-CM

## 2021-11-02 DIAGNOSIS — M25662 Stiffness of left knee, not elsewhere classified: Secondary | ICD-10-CM | POA: Diagnosis not present

## 2021-11-02 DIAGNOSIS — R2689 Other abnormalities of gait and mobility: Secondary | ICD-10-CM

## 2021-11-02 DIAGNOSIS — I255 Ischemic cardiomyopathy: Secondary | ICD-10-CM

## 2021-11-02 DIAGNOSIS — I4819 Other persistent atrial fibrillation: Secondary | ICD-10-CM

## 2021-11-02 DIAGNOSIS — I428 Other cardiomyopathies: Secondary | ICD-10-CM

## 2021-11-02 NOTE — Patient Instructions (Signed)
Access Code: [email or web address] URL: [email or web address] Date: 11/02/2021 Prepared by: Jamey Reas  Exercises Quad Setting and Stretching - 2-4 x daily - 7 x weekly - 5-10 sets - 10 reps - prop 5-10 minutes & quad set5 seconds hold Ankle Alphabet in Elevation - 2-4 x daily - 7 x weekly - 1 sets - 1 reps Supine Heel Slide with Strap - 2-3 x daily - 7 x weekly - 2-3 sets - 10 reps - 5 seconds hold Supine Straight Leg Raises - 2-3 x daily - 7 x weekly - 2-3 sets - 10 reps - 5 seconds hold Seated Knee Flexion Extension AROM - 2-4 x daily - 7 x weekly - 2-3 sets - 10 reps - 5 seconds hold Seated Hamstring Stretch with Strap - 2-4 x daily - 7 x weekly - 1 sets - 3 reps - 20-30 seconds hold

## 2021-11-02 NOTE — Therapy (Addendum)
[facility] [address] [place], [place], [address] Phone: [phone]   Fax:  [phone]  Physical Therapy Evaluation  Patient Details  Name: [name] MRN: [id number] Date of Birth: [birth year] Referring Provider (PT): Jean Rosenthal, MD   Encounter Date: 11/02/2021   PT End of Session - 11/02/21 1339     Visit Number 1    Number of Visits 17    Date for PT Re-Evaluation 12/29/21    Authorization Type [employer]    Authorization Time Period $10 copay    Progress Note Due on Visit 10    PT Start Time 1300    PT Stop Time 1345    PT Time Calculation (min) 45 min    Activity Tolerance Patient tolerated treatment well;Patient limited by pain    Behavior During Therapy [facility] for tasks assessed/performed             Past Medical History:  Diagnosis Date   AICD (automatic cardioverter/defibrillator) present    Anemia    supposed to be taking Vit B but doesn't   ANXIETY    takes Xanax nightly   Arthritis    Asthma    Albuterol prn and Advair daily;also takes Prednisone daily   Atrial fibrillation ([place]) 09/06/2015   Cardiomyopathy ([place])    a. EF 25% TEE July 2013; b. EF normalized 2015;  c. 03/2015 Echo: EF 40-45%, difrf HK, PASP 38 mmHg, Mild MR, sev LAE/RAE.   CHF (congestive heart failure) (HCC)    Chronic constipation    takes OTC stool softener   COPD (chronic obstructive pulmonary disease) (HCC)    "one dr says COPD; one dr says emphysema" (09/18/2017)   DEPRESSION    takes Zoloft and Doxepin daily   Diverticulitis    DYSKINESIA, ESOPHAGUS    Dysrhythmia    Atrial Fibrilation   Emphysema of lung [facility])    "one dr says COPD; one dr says emphysema" (09/18/2017)   Essential hypertension        FIBROMYALGIA    GERD (gastroesophageal reflux disease)        Glaucoma    HYPERLIPIDEMIA    a. Intolerant to statins.   INSOMNIA    takes Ambien nightly   Myocardial infarction [facility])    a. 2012 Myoview notable for  prior infarct;  b. 03/2015 Lexiscan CL: EF 37%, diff HK, small area of inferior infarct from apex to base-->Med Rx.   Myocardial infarction ([place])    O2 dependent    "2.5L q hs & prn" (09/18/2017)   Paroxysmal atrial fibrillation ([place])    a. CHA2DS2VASc = 3--> takes Coumadin;  b. 03/15/2015 Successful TEE/DCCV;  c. 03/2015 recurrent afib, Amio d/c'd in setting of hyperthyroidism.   Peripheral neuropathy    Pneumonia 12/2016   Rash and other nonspecific skin eruption 04/12/2009   no cause found saw dermatologists x 2 and allergist   SLEEP APNEA, OBSTRUCTIVE    a. doesn't use CPAP   Syncope    a. 03/2015 s/p MDT LINQ.   Type II diabetes mellitus ([place])         Past Surgical History:  Procedure Laterality Date   ACNE CYST REMOVAL     2 on back    AV NODE ABLATION N/A 10/25/2017   Procedure: AV NODE ABLATION;  Surgeon: Deboraha Sprang, MD;  Location: [place] [facility];  Service: Cardiovascular;  Laterality: N/A;   BIV ICD INSERTION CRT-D N/A 09/18/2017   Procedure: BIV ICD INSERTION  CRT-D;  Surgeon: Deboraha Sprang, MD;  Location: [place] [facility];  Service: Cardiovascular;  Laterality: N/A;   CARDIAC CATHETERIZATION N/A 03/21/2016   Procedure: Right/Left Heart Cath and Coronary Angiography;  Surgeon: Larey Dresser, MD;  Location: [place] [facility];  Service: Cardiovascular;  Laterality: N/A;   CARDIOVERSION  04/18/2012   Procedure: CARDIOVERSION;  Surgeon: Fay Records, MD;  Location: [place];  Service: Cardiovascular;  Laterality: N/A;   CARDIOVERSION  04/25/2012   Procedure: CARDIOVERSION;  Surgeon: Thayer Headings, MD;  Location: [place];  Service: Cardiovascular;  Laterality: N/A;   CARDIOVERSION  04/25/2012   Procedure: CARDIOVERSION;  Surgeon: Fay Records, MD;  Location: [place];  Service: Cardiovascular;  Laterality: N/A;   CARDIOVERSION  05/09/2012   Procedure: CARDIOVERSION;  Surgeon: Sherren Mocha, MD;  Location: [place];  Service: Cardiovascular;  Laterality: N/A;  changed from  crenshaw to cooper by trish/leone-endo   CARDIOVERSION N/A 03/15/2015   Procedure: CARDIOVERSION;  Surgeon: Thayer Headings, MD;  Location: [place];  Service: Cardiovascular;  Laterality: N/A;   COLONOSCOPY     COLONOSCOPY WITH PROPOFOL N/A 10/21/2014   Procedure: COLONOSCOPY WITH PROPOFOL;  Surgeon: Ladene Artist, MD;  Location: [facility];  Service: Endoscopy;  Laterality: N/A;   EP IMPLANTABLE DEVICE N/A 04/06/2015   Procedure: Loop Recorder Insertion;  Surgeon: Evans Lance, MD;  Location: [place] [facility];  Service: Cardiovascular;  Laterality: N/A;   ESOPHAGOGASTRODUODENOSCOPY     JOINT REPLACEMENT     LOOP RECORDER REMOVAL N/A 09/18/2017   Procedure: LOOP RECORDER REMOVAL;  Surgeon: Deboraha Sprang, MD;  Location: [place] [facility];  Service: Cardiovascular;  Laterality: N/A;   RIGHT/LEFT HEART CATH AND CORONARY ANGIOGRAPHY N/A 01/28/2017   Procedure: Right/Left Heart Cath and Coronary Angiography;  Surgeon: Larey Dresser, MD;  Location: [place] [facility];  Service: Cardiovascular;  Laterality: N/A;   TEE WITHOUT CARDIOVERSION  04/25/2012   Procedure: TRANSESOPHAGEAL ECHOCARDIOGRAM (TEE);  Surgeon: Thayer Headings, MD;  Location: [place];  Service: Cardiovascular;  Laterality: N/A;   TEE WITHOUT CARDIOVERSION N/A 03/15/2015   Procedure: TRANSESOPHAGEAL ECHOCARDIOGRAM (TEE);  Surgeon: Thayer Headings, MD;  Location: [place];  Service: Cardiovascular;  Laterality: N/A;   TONSILLECTOMY AND ADENOIDECTOMY     TOTAL KNEE ARTHROPLASTY Right 06/15/2014   Procedure: TOTAL KNEE ARTHROPLASTY;  Surgeon: Renette Butters, MD;  Location: [place];  Service: Orthopedics;  Laterality: Right;   TOTAL KNEE ARTHROPLASTY Left 10/17/2021   Procedure: Left TOTAL KNEE ARTHROPLASTY;  Surgeon: Mcarthur Rossetti, MD;  Location: [place];  Service: Orthopedics;  Laterality: Left;    There were no vitals filed for this visit.    Subjective Assessment - 11/02/21 1307     Subjective This 66yo  male was referred to PT by Jean Rosenthal, MD with [phone] (ICD-10-CM) - Unilateral primary osteoarthritis, left knee & O75.643 (ICD-10-CM) - Status post total knee replacement, left performed on 10/17/2021. HHPT thru 1/17    Pertinent History OA, syncope, glaucoma, DM2    Limitations Lifting;Standing;Walking;House hold activities    Patient Stated Goals walking in community, play drum in church    Currently in Pain? Yes    Pain Score 7    In last week, lowest 7/10 and highest 7/10   Pain Location Knee    Pain Orientation Left    Pain Descriptors / Indicators Sharp;Sore;Tightness    Pain Type Surgical pain    Pain Onset 1 to  4 weeks ago    Pain Frequency Constant    Aggravating Factors  bending knee    Pain Relieving Factors medication & rest    Effect of Pain on Daily Activities standing  & gait limitations                [facility] PT Assessment - 11/02/21 1300       Assessment   Medical Diagnosis M17.12 (ICD-10-CM) - Unilateral primary osteoarthritis, left knee & E83.151 (ICD-10-CM) - Status post total knee replacement, left    Referring Provider (PT) Jean Rosenthal, MD    Onset Date/Surgical Date 10/17/21    Hand Dominance Left    Prior Therapy HHPT      Precautions   Precautions ICD/Pacemaker;Fall      Restrictions   Weight Bearing Restrictions Yes    LLE Weight Bearing Weight bearing as tolerated      Balance Screen   Has the patient fallen in the past 6 months Yes    How many times? 1    Has the patient had a decrease in activity level because of a fear of falling?  No    Is the patient reluctant to leave their home because of a fear of falling?  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 below knee 41.2cm    Circumferential - Left  above knee 46.4cm, around knee 43.6cm, below knee 41.9cm      ROM / Strength   AROM / PROM / Strength AROM;PROM;Strength      AROM   AROM Assessment Site Knee    Right/Left Knee Left    Left Knee Extension -18   seated LAQ   Left Knee Flexion 70   standing     PROM   PROM Assessment Site Knee    Right/Left Knee Left    Left Knee Extension -7   supine   Left Knee Flexion 93   supine     Strength   Strength Assessment Site Knee    Right/Left Knee Left    Left Knee Flexion 3-/5    Left Knee Extension 3-/5      Ambulation/Gait   Ambulation/Gait Yes    Ambulation/Gait Assistance 5: Supervision    Ambulation Distance (Feet) 75 Feet    Assistive device Straight cane   2 straight canes   Gait Pattern Step-to pattern;Decreased step length - right;Decreased stance time - left;Decreased hip/knee flexion - left;Decreased weight shift to left;Left hip hike;Left flexed knee in stance;Antalgic;Lateral hip instability;Trunk  flexed    Ambulation Surface Level;Unlevel                        Objective measurements completed on examination: See above findings.       [facility] Adult PT Treatment/Exercise - 11/02/21 1300       Exercises   Exercises Knee/Hip;Other Exercises    Other Exercises  one set ea of HEP Access Code: [facility]                      PT Education - 11/02/21 1334     Education Details Access Code: [email or web address]    Person(s) Educated Patient    Methods Explanation;Demonstration;Tactile cues;Verbal cues;Handout    Comprehension Verbalized understanding;Returned demonstration;Verbal cues required;Tactile cues required;Need further instruction              PT Short Term Goals - 11/02/21 1355       PT SHORT TERM GOAL #1   Title patient demo & verbalizes understanding & compliance with HEP.    Time 4    Period Weeks    Status New    Target Date 12/01/21      PT SHORT TERM GOAL #2   Title left knee PROM ext 0* to 100* flex    Time 4    Period Weeks    Status New    Target Date 12/01/21      PT SHORT TERM GOAL #3   Title Patient reports left knee pain </= 5/10    Time 4    Period Weeks    Status New    Target Date 12/01/21               PT Long Term Goals - 11/02/21 1352       PT LONG TERM GOAL #1   Title FOTO score >/= 60%    Time 8    Period Weeks    Status New    Target Date 12/29/21      PT LONG TERM GOAL #2   Title left knee pain </= 2/10 with standing & gait activities    Time 8    Period Weeks    Status New    Target Date 12/29/21      PT LONG TERM GOAL #3   Title Left knee AROM 0* ext to 105* flexion    Time 8    Period Weeks    Status New    Target Date 12/29/21      PT LONG TERM GOAL #4   Title Patient ambulates with 2 canes modified independent for community mobility including ramps, curbs & stairs.    Time 8    Period Weeks    Target Date 12/29/21                    Plan - 11/02/21 1348     Clinical Impression Statement Pt is a 65y/o male who presents to OPPT s/p Rt TKA on 10/17/2021.  He demonstrates decreased strength, ROM, increased edema and pain with gait abnormalities affecting functional mobility.  Pt will benefit from PT to address deficits listed.    Personal Factors and Comorbidities Comorbidity 3+;Fitness    Comorbidities HTN,  Bundle branch block, pacemaker, anxiety, MI, cardiomyopathy, COPD, DJD, OA, syncope, glaucoma, DM2    Examination-Activity Limitations Carry;Locomotion Level;Sleep;Squat;Stairs;Stand;Transfers    Examination-Participation Restrictions Church;Community Activity;Occupation    Stability/Clinical Decision Making Evolving/Moderate complexity    Clinical Decision  Making Low    Rehab Potential Good    PT Frequency 2x / week    PT Duration 8 weeks    PT Treatment/Interventions ADLs/Self Care Home Management;Cryotherapy;Electrical Stimulation;Moist Heat;Gait training;Stair training;Functional mobility training;Therapeutic activities;Therapeutic exercise;Balance training;Neuromuscular re-education;DME Instruction;Patient/family education;Manual techniques;Scar mobilization;Passive range of motion;Vasopneumatic Device;Joint Manipulations    PT Next Visit Plan check & update HEP,  manual therapy & therapeutic exercise to increase ROM,  vaso to end session    PT Home Exercise Plan Access Code: [email or web address]    Consulted and Agree with Plan of Care Patient             Patient will benefit from skilled therapeutic intervention in order to improve the following deficits and impairments:  Abnormal gait, Decreased activity tolerance, Decreased balance, Decreased endurance, Decreased knowledge of use of DME, Decreased mobility, Decreased range of motion, Decreased skin integrity, Decreased scar mobility, Decreased strength, Difficulty walking, Increased edema, Impaired flexibility, Postural dysfunction, Obesity, Pain  Visit Diagnosis: Acute pain of left knee  Stiffness of left knee, not elsewhere classified  Muscle weakness (generalized)  Localized edema  Other abnormalities of gait and mobility  Unsteadiness on feet  Referring diagnosis? M17.12 (ICD-10-CM) - Unilateral primary osteoarthritis, left knee & Z96.652 (ICD-10-CM) - Status post total knee replacement, left Treatment diagnosis? (if different  than referring diagnosis)   ICD-10-CM   PL                   1.   Acute pain of left knee M25.562  Change Dx     2.   Stiffness of left knee, not elsewhere classified M25.662  Change Dx     3.   Muscle weakness (generalized) M62.81  Change Dx     4.   Localized edema R60.0  Change Dx     5.   Other abnormalities of gait and mobility R26.89  Change Dx     6.   Unsteadiness on feet R26.81       What was this (referring dx) caused by? [x]  Surgery []  Fall []  Ongoing issue []  Arthritis []  Other: ____________  Laterality: []  Rt [x]  Lt []  Both  Check all possible CPT codes:  *CHOOSE 10 OR LESS*    [x]  97110 (Therapeutic Exercise)  []  92507 (SLP Treatment)  [x]  97112 (Neuro Re-ed)   []  92526 (Swallowing Treatment)   [x]  97116 (Gait Training)   []  [id number] (Cognitive Training, 1st 15 minutes) [x]  97140 (Manual Therapy)   []  97130 (Cognitive Training, each add'l 15 minutes)  [x]  97530 (Therapeutic Activities)  []  Other, List CPT Code ____________    [x]  97535 (Self Care)       []  All codes above (97110 - 97535)  []  97012 (Mechanical Traction)  [x]  97014 (E-stim Unattended)  []  97032 (E-stim manual)  []  97033 (Ionto)  []  97035 (Ultrasound)  []  97760 (Orthotic Fit) []  [id number] (Physical Performance Training) []  [id number] (Aquatic Therapy) []  97034 (Contrast Bath) []  [id number] (Paraffin) []  97597 (Wound Care 1st 20 sq cm) []  97598 (Wound Care each add'l 20 sq cm) [x]  97016 (Vasopneumatic Device) []  [phone] Comptroller) []  [phone] (Prosthetic Training)    Problem List Patient Active Problem List   Diagnosis Date Noted   Unilateral primary osteoarthritis, left knee 10/17/2021   Status post total knee replacement, left 10/17/2021   Rib pain on right side 10/10/2021   History of colonic polyps 09/26/2021   Acute cough 07/21/2021   Bursitis of left hip 06/13/2021   Tooth fracture  06/11/2021   Pain and swelling of right lower leg 05/18/2021   Hemorrhagic shock ([place]) 05/05/2021    Gastrointestinal hemorrhage    Diverticulitis of colon    Preop exam for internal medicine 03/04/2021   B12 deficiency 03/04/2021   Vitamin D deficiency 03/01/2021   Myalgia 12/01/2020   Laceration of left leg 09/17/2020   Statin myopathy 08/02/2020   Rotator cuff arthropathy of both shoulders 02/23/2020   (HFpEF) heart failure with preserved ejection fraction ([place]) 02/22/2020   ICD (implantable cardioverter-defibrillator) in place 12/27/2019   COVID-19 virus infection 12/09/2019   COVID-19 12/09/2019   Hypotension 12/09/2019   Upper respiratory infection 11/27/2019   COPD exacerbation ([place]) 11/27/2019   Elevated PSA 09/23/2019   Lip lesion 12/23/2018   Diabetic ulcer of left lower leg with necrosis of muscle ([place]) 12/05/2018   Chronic bursitis of left shoulder 07/01/2018   Pain due to total knee replacement ([place]) 03/31/2018   Rotator cuff arthropathy, left 02/13/2018   Right rotator cuff tear arthropathy 01/14/2018   Increased prostate specific antigen (PSA) velocity 09/26/2017   Trigger point of thoracic region 07/04/2017   Right lumbar radiculopathy 05/15/2017   Right leg swelling 05/09/2017   Glaucoma 04/18/2017   History of nasal polyposis 02/19/2017   Chronic rhinitis 02/19/2017   Microhematuria 12/04/2016   Hyperlipidemia 08/15/2016   Encounter for well adult exam with abnormal findings 11/29/2015   Persistent atrial fibrillation ([place]) 03/30/2015   Syncope 03/30/2015   Acute on chronic diastolic heart failure ([place]) 03/30/2015   Former tobacco use 02/25/2015   Type 2 diabetes mellitus ([place]) 02/25/2015   Atrial fibrillation ([place]) 02/25/2015   Anemia 02/23/2015   Thyrotoxicosis 02/23/2015   Hx of adenomatous colonic polyps 10/21/2014   De Quervain's tenosynovitis, right 04/30/2014   Osteoarthritis of right knee 02/19/2014   Encounter for therapeutic drug monitoring 11/17/2013   Thoracic degenerative disc disease 06/17/2013   Thoracic spondylosis without myelopathy  02/11/2013   Lumbosacral spondylosis without myelopathy 02/11/2013   Degenerative joint disease of knee, left 02/11/2013   COPD (chronic obstructive pulmonary disease) ([place]) 08/20/2012   Bundle branch block, right and extreme left anterior fascicular 05/05/2012   Nonischemic cardiomyopathy ([place]) 05/02/2012   Other primary cardiomyopathies 05/02/2012   Anticoagulated on warfarin 03/21/2012   Spongiotic dermatitis 09/26/2011   Past myocardial infarction 04/19/2011   UNSPECIFIED URETHRAL STRICTURE 12/26/2010   Chronic pain syndrome 05/18/2010   Obstructive sleep apnea 10/21/2007   NEPHROLITHIASIS, HX OF 10/21/2007   Anxiety 06/09/2007   GERD (gastroesophageal reflux disease) 06/09/2007   Benign prostatic hyperplasia 06/09/2007   Morbid obesity ([place]) 06/03/2007   Depression 06/03/2007   Essential hypertension 06/03/2007   INSOMNIA 06/03/2007    Jamey Reas, PT, DPT 11/02/2021, 1:56 PM  [facility] [address] [place], [place], [address] Phone: [phone]   Fax:  [phone]  Name: [name] MRN: [id number] Date of Birth: 12-May-1956

## 2021-11-03 ENCOUNTER — Telehealth: Payer: Self-pay | Admitting: *Deleted

## 2021-11-03 NOTE — Telephone Encounter (Signed)
Call from patient stating he has purchased a vehicle and will no longer need [place] transportation services for appointments. CM canceled future scheduled rides for therapy through App.

## 2021-11-03 NOTE — Progress Notes (Signed)
Remote ICD transmission.   

## 2021-11-06 ENCOUNTER — Telehealth: Payer: Self-pay | Admitting: *Deleted

## 2021-11-06 ENCOUNTER — Other Ambulatory Visit: Payer: Self-pay | Admitting: Orthopaedic Surgery

## 2021-11-06 MED ORDER — OXYCODONE HCL 5 MG PO TABS: 5.0000 mg | ORAL_TABLET | ORAL | 0 refills | Status: DC | PRN

## 2021-11-06 NOTE — Telephone Encounter (Signed)
Patient called requesting refill of pain medication. [facility] [address] Is his pharmacy. Thanks.

## 2021-11-07 ENCOUNTER — Encounter: Payer: Medicare HMO | Admitting: Physical Therapy

## 2021-11-09 ENCOUNTER — Other Ambulatory Visit: Payer: Self-pay

## 2021-11-09 ENCOUNTER — Encounter: Payer: Self-pay | Admitting: Physical Therapy

## 2021-11-09 ENCOUNTER — Ambulatory Visit: Payer: Medicare HMO | Admitting: Physical Therapy

## 2021-11-09 DIAGNOSIS — R600 Localized edema: Secondary | ICD-10-CM

## 2021-11-09 DIAGNOSIS — R2689 Other abnormalities of gait and mobility: Secondary | ICD-10-CM | POA: Diagnosis not present

## 2021-11-09 DIAGNOSIS — M25662 Stiffness of left knee, not elsewhere classified: Secondary | ICD-10-CM

## 2021-11-09 DIAGNOSIS — R2681 Unsteadiness on feet: Secondary | ICD-10-CM

## 2021-11-09 DIAGNOSIS — M25562 Pain in left knee: Secondary | ICD-10-CM | POA: Diagnosis not present

## 2021-11-09 DIAGNOSIS — M6281 Muscle weakness (generalized): Secondary | ICD-10-CM

## 2021-11-09 NOTE — Therapy (Signed)
[facility] [address] [place], [place], [address] Phone: [phone]   Fax:  [phone]  Physical Therapy Treatment  Patient Details  Name: [name] MRN: [id number] Date of Birth: 12/24/1955 Referring Provider (PT): Jean Rosenthal, MD   Encounter Date: 11/09/2021   PT End of Session - 11/09/21 1346     Visit Number 2    Number of Visits 17    Date for PT Re-Evaluation 12/29/21    Authorization Type [employer]    Authorization Time Period $10 copay    Progress Note Due on Visit 10    PT Start Time 1257    PT Stop Time 1338    PT Time Calculation (min) 41 min    Activity Tolerance Patient tolerated treatment well    Behavior During Therapy [facility] for tasks assessed/performed             Past Medical History:  Diagnosis Date   AICD (automatic cardioverter/defibrillator) present    Anemia    supposed to be taking Vit B but doesn't   ANXIETY    takes Xanax nightly   Arthritis    Asthma    Albuterol prn and Advair daily;also takes Prednisone daily   Atrial fibrillation ([place]) 09/06/2015   Cardiomyopathy ([place])    a. EF 25% TEE July 2013; b. EF normalized 2015;  c. 03/2015 Echo: EF 40-45%, difrf HK, PASP 38 mmHg, Mild MR, sev LAE/RAE.   CHF (congestive heart failure) (HCC)    Chronic constipation    takes OTC stool softener   COPD (chronic obstructive pulmonary disease) (HCC)    "one dr says COPD; one dr says emphysema" (09/18/2017)   DEPRESSION    takes Zoloft and Doxepin daily   Diverticulitis    DYSKINESIA, ESOPHAGUS    Dysrhythmia    Atrial Fibrilation   Emphysema of lung [facility])    "one dr says COPD; one dr says emphysema" (09/18/2017)   Essential hypertension        FIBROMYALGIA    GERD (gastroesophageal reflux disease)        Glaucoma    HYPERLIPIDEMIA    a. Intolerant to statins.   INSOMNIA    takes Ambien nightly   Myocardial infarction [facility])    a. 2012 Myoview notable for prior infarct;  b. 03/2015  Lexiscan CL: EF 37%, diff HK, small area of inferior infarct from apex to base-->Med Rx.   Myocardial infarction ([place])    O2 dependent    "2.5L q hs & prn" (09/18/2017)   Paroxysmal atrial fibrillation ([place])    a. CHA2DS2VASc = 3--> takes Coumadin;  b. 03/15/2015 Successful TEE/DCCV;  c. 03/2015 recurrent afib, Amio d/c'd in setting of hyperthyroidism.   Peripheral neuropathy    Pneumonia 12/2016   Rash and other nonspecific skin eruption 04/12/2009   no cause found saw dermatologists x 2 and allergist   SLEEP APNEA, OBSTRUCTIVE    a. doesn't use CPAP   Syncope    a. 03/2015 s/p MDT LINQ.   Type II diabetes mellitus ([place])         Past Surgical History:  Procedure Laterality Date   ACNE CYST REMOVAL     2 on back    AV NODE ABLATION N/A 10/25/2017   Procedure: AV NODE ABLATION;  Surgeon: Deboraha Sprang, MD;  Location: [place] CV [facility];  Service: Cardiovascular;  Laterality: N/A;   BIV ICD INSERTION CRT-D N/A 09/18/2017   Procedure: BIV ICD INSERTION CRT-D;  Surgeon:  Deboraha Sprang, MD;  Location: [place] [facility];  Service: Cardiovascular;  Laterality: N/A;   CARDIAC CATHETERIZATION N/A 03/21/2016   Procedure: Right/Left Heart Cath and Coronary Angiography;  Surgeon: Larey Dresser, MD;  Location: [place] CV [facility];  Service: Cardiovascular;  Laterality: N/A;   CARDIOVERSION  04/18/2012   Procedure: CARDIOVERSION;  Surgeon: Fay Records, MD;  Location: [place];  Service: Cardiovascular;  Laterality: N/A;   CARDIOVERSION  04/25/2012   Procedure: CARDIOVERSION;  Surgeon: Thayer Headings, MD;  Location: [place];  Service: Cardiovascular;  Laterality: N/A;   CARDIOVERSION  04/25/2012   Procedure: CARDIOVERSION;  Surgeon: Fay Records, MD;  Location: [place];  Service: Cardiovascular;  Laterality: N/A;   CARDIOVERSION  05/09/2012   Procedure: CARDIOVERSION;  Surgeon: Sherren Mocha, MD;  Location: [place];  Service: Cardiovascular;  Laterality: N/A;  changed from crenshaw to cooper by  trish/leone-endo   CARDIOVERSION N/A 03/15/2015   Procedure: CARDIOVERSION;  Surgeon: Thayer Headings, MD;  Location: [place];  Service: Cardiovascular;  Laterality: N/A;   COLONOSCOPY     COLONOSCOPY WITH PROPOFOL N/A 10/21/2014   Procedure: COLONOSCOPY WITH PROPOFOL;  Surgeon: Ladene Artist, MD;  Location: [facility];  Service: Endoscopy;  Laterality: N/A;   EP IMPLANTABLE DEVICE N/A 04/06/2015   Procedure: Loop Recorder Insertion;  Surgeon: Evans Lance, MD;  Location: [place] [facility];  Service: Cardiovascular;  Laterality: N/A;   ESOPHAGOGASTRODUODENOSCOPY     JOINT REPLACEMENT     LOOP RECORDER REMOVAL N/A 09/18/2017   Procedure: LOOP RECORDER REMOVAL;  Surgeon: Deboraha Sprang, MD;  Location: [place] CV [facility];  Service: Cardiovascular;  Laterality: N/A;   RIGHT/LEFT HEART CATH AND CORONARY ANGIOGRAPHY N/A 01/28/2017   Procedure: Right/Left Heart Cath and Coronary Angiography;  Surgeon: Larey Dresser, MD;  Location: [place] [facility];  Service: Cardiovascular;  Laterality: N/A;   TEE WITHOUT CARDIOVERSION  04/25/2012   Procedure: TRANSESOPHAGEAL ECHOCARDIOGRAM (TEE);  Surgeon: Thayer Headings, MD;  Location: [place];  Service: Cardiovascular;  Laterality: N/A;   TEE WITHOUT CARDIOVERSION N/A 03/15/2015   Procedure: TRANSESOPHAGEAL ECHOCARDIOGRAM (TEE);  Surgeon: Thayer Headings, MD;  Location: [place];  Service: Cardiovascular;  Laterality: N/A;   TONSILLECTOMY AND ADENOIDECTOMY     TOTAL KNEE ARTHROPLASTY Right 06/15/2014   Procedure: TOTAL KNEE ARTHROPLASTY;  Surgeon: Renette Butters, MD;  Location: [place];  Service: Orthopedics;  Laterality: Right;   TOTAL KNEE ARTHROPLASTY Left 10/17/2021   Procedure: Left TOTAL KNEE ARTHROPLASTY;  Surgeon: Mcarthur Rossetti, MD;  Location: [place];  Service: Orthopedics;  Laterality: Left;    There were no vitals filed for this visit.   Subjective Assessment - 11/09/21 1257     Subjective He has been doing his exercises  without issues.  He went to church last night using only 1 cane.    Pertinent History OA, syncope, glaucoma, DM2    Limitations Lifting;Standing;Walking;House hold activities    Patient Stated Goals walking in community, play drum in church    Currently in Pain? Yes    Pain Score 6    in last PT, lowest 0/10 - highest 8/10   Pain Location Knee    Pain Orientation Left    Pain Descriptors / Indicators Sore;Tightness;Aching    Pain Type Surgical pain    Pain Onset 1 to 4 weeks ago    Pain Frequency Intermittent    Aggravating Factors  stairs, bending knee  Pain Relieving Factors med & rest    Effect of Pain on Daily Activities standing & giat limitations                [facility] PT Assessment - 11/09/21 1300       Assessment   Medical Diagnosis M17.12 (ICD-10-CM) - Unilateral primary osteoarthritis, left knee & A41.660 (ICD-10-CM) - Status post total knee replacement, left    Referring Provider (PT) Jean Rosenthal, MD    Onset Date/Surgical Date 10/17/21      PROM   Left Knee Extension -3   supine after manual therapy   Left Knee Flexion 97   seated after manual therapy                          [facility] Adult PT Treatment/Exercise - 11/09/21 1300       Ambulation/Gait   Ambulation/Gait Yes    Ambulation/Gait Assistance 6: Modified independent (Device/Increase time)    Assistive device Straight cane;Other (Comment)   2 straight canes   Ambulation Surface Level;Indoor      Knee/Hip Exercises: Aerobic   Nustep seat 13 level 7 with BLEs & BUEs for 8 min      Knee/Hip Exercises: Standing   Heel Raises Both;1 set;20 reps;2 seconds   BUE support     Knee/Hip Exercises: Seated   Long Arc Quad Both;2 sets;10 reps;Weights    Long Arc Quad Weight 2 lbs.    Hamstring Curl Both;2 sets;10 reps   green theraband   Hamstring Limitations cues for controlled eccentric      Knee/Hip Exercises: Supine   Heel Slides AAROM;Left;15 reps    Heel Slides Limitations  foot on ball with strap    Bridges Strengthening;Both;1 set;15 reps    Straight Leg Raises Strengthening;Both;2 sets;10 reps      Modalities   Modalities Vasopneumatic      Manual Therapy   Manual Therapy Joint mobilization    Manual therapy comments grade It knee flex seated traction with tibial int rot and ext supine with femoral int rot                       PT Short Term Goals - 11/02/21 1355       PT SHORT TERM GOAL #1   Title patient demo & verbalizes understanding & compliance with HEP.    Time 4    Period Weeks    Status New    Target Date 12/01/21      PT SHORT TERM GOAL #2   Title left knee PROM ext 0* to 100* flex    Time 4    Period Weeks    Status New    Target Date 12/01/21      PT SHORT TERM GOAL #3   Title Patient reports left knee pain </= 5/10    Time 4    Period Weeks    Status New    Target Date 12/01/21               PT Long Term Goals - 11/02/21 1352       PT LONG TERM GOAL #1   Title FOTO score >/= 60%    Time 8    Period Weeks    Status New    Target Date 12/29/21      PT LONG TERM GOAL #2   Title left knee pain </= 2/10 with standing & gait activities  Time 8    Period Weeks    Status New    Target Date 12/29/21      PT LONG TERM GOAL #3   Title Left knee AROM 0* ext to 105* flexion    Time 8    Period Weeks    Status New    Target Date 12/29/21      PT LONG TERM GOAL #4   Title Patient ambulates with 2 canes modified independent for community mobility including ramps, curbs & stairs.    Time 8    Period Weeks    Target Date 12/29/21                   Plan - 11/09/21 1303     Clinical Impression Statement Patient's range improved with manual therapy.  He tolerated progressive stregthening exercises with significant increase in pain.    Personal Factors and Comorbidities Comorbidity 3+;Fitness    Comorbidities HTN, Bundle branch block, pacemaker, anxiety, MI, cardiomyopathy, COPD, DJD, OA,  syncope, glaucoma, DM2    Examination-Activity Limitations Carry;Locomotion Level;Sleep;Squat;Stairs;Stand;Transfers    Examination-Participation Restrictions Church;Community Activity;Occupation    Stability/Clinical Decision Making Evolving/Moderate complexity    Rehab Potential Good    PT Frequency 2x / week    PT Duration 8 weeks    PT Treatment/Interventions ADLs/Self Care Home Management;Cryotherapy;Electrical Stimulation;Moist Heat;Gait training;Stair training;Functional mobility training;Therapeutic activities;Therapeutic exercise;Balance training;Neuromuscular re-education;DME Instruction;Patient/family education;Manual techniques;Scar mobilization;Passive range of motion;Vasopneumatic Device;Joint Manipulations    PT Next Visit Plan update HEP,  manual therapy & therapeutic exercise to increase ROM,  vaso to end session as needed    PT Home Exercise Plan Access Code: [email or web address]    Consulted and Agree with Plan of Care Patient             Patient will benefit from skilled therapeutic intervention in order to improve the following deficits and impairments:  Abnormal gait, Decreased activity tolerance, Decreased balance, Decreased endurance, Decreased knowledge of use of DME, Decreased mobility, Decreased range of motion, Decreased skin integrity, Decreased scar mobility, Decreased strength, Difficulty walking, Increased edema, Impaired flexibility, Postural dysfunction, Obesity, Pain  Visit Diagnosis: Acute pain of left knee  Stiffness of left knee, not elsewhere classified  Muscle weakness (generalized)  Localized edema  Other abnormalities of gait and mobility  Unsteadiness on feet     Problem List Patient Active Problem List   Diagnosis Date Noted   Unilateral primary osteoarthritis, left knee 10/17/2021   Status post total knee replacement, left 10/17/2021   Rib pain on right side 10/10/2021   History of colonic polyps 09/26/2021   Acute cough 07/21/2021    Bursitis of left hip 06/13/2021   Tooth fracture 06/11/2021   Pain and swelling of right lower leg 05/18/2021   Hemorrhagic shock ([place]) 05/05/2021   Gastrointestinal hemorrhage    Diverticulitis of colon    Preop exam for internal medicine 03/04/2021   B12 deficiency 03/04/2021   Vitamin D deficiency 03/01/2021   Myalgia 12/01/2020   Laceration of left leg 09/17/2020   Statin myopathy 08/02/2020   Rotator cuff arthropathy of both shoulders 02/23/2020   (HFpEF) heart failure with preserved ejection fraction ([place]) 02/22/2020   ICD (implantable cardioverter-defibrillator) in place 12/27/2019   COVID-19 virus infection 12/09/2019   COVID-19 12/09/2019   Hypotension 12/09/2019   Upper respiratory infection 11/27/2019   COPD exacerbation ([place]) 11/27/2019   Elevated PSA 09/23/2019   Lip lesion 12/23/2018   Diabetic ulcer of left lower leg with necrosis of muscle (  [place]) 12/05/2018   Chronic bursitis of left shoulder 07/01/2018   Pain due to total knee replacement ([place]) 03/31/2018   Rotator cuff arthropathy, left 02/13/2018   Right rotator cuff tear arthropathy 01/14/2018   Increased prostate specific antigen (PSA) velocity 09/26/2017   Trigger point of thoracic region 07/04/2017   Right lumbar radiculopathy 05/15/2017   Right leg swelling 05/09/2017   Glaucoma 04/18/2017   History of nasal polyposis 02/19/2017   Chronic rhinitis 02/19/2017   Microhematuria 12/04/2016   Hyperlipidemia 08/15/2016   Encounter for well adult exam with abnormal findings 11/29/2015   Persistent atrial fibrillation ([place]) 03/30/2015   Syncope 03/30/2015   Acute on chronic diastolic heart failure ([place]) 03/30/2015   Former tobacco use 02/25/2015   Type 2 diabetes mellitus ([place]) 02/25/2015   Atrial fibrillation ([place]) 02/25/2015   Anemia 02/23/2015   Thyrotoxicosis 02/23/2015   Hx of adenomatous colonic polyps 10/21/2014   De Quervain's tenosynovitis, right 04/30/2014   Osteoarthritis of right knee  02/19/2014   Encounter for therapeutic drug monitoring 11/17/2013   Thoracic degenerative disc disease 06/17/2013   Thoracic spondylosis without myelopathy 02/11/2013   Lumbosacral spondylosis without myelopathy 02/11/2013   Degenerative joint disease of knee, left 02/11/2013   COPD (chronic obstructive pulmonary disease) ([place]) 08/20/2012   Bundle branch block, right and extreme left anterior fascicular 05/05/2012   Nonischemic cardiomyopathy ([place]) 05/02/2012   Other primary cardiomyopathies 05/02/2012   Anticoagulated on warfarin 03/21/2012   Spongiotic dermatitis 09/26/2011   Past myocardial infarction 04/19/2011   UNSPECIFIED URETHRAL STRICTURE 12/26/2010   Chronic pain syndrome 05/18/2010   Obstructive sleep apnea 10/21/2007   NEPHROLITHIASIS, HX OF 10/21/2007   Anxiety 06/09/2007   GERD (gastroesophageal reflux disease) 06/09/2007   Benign prostatic hyperplasia 06/09/2007   Morbid obesity ([place]) 06/03/2007   Depression 06/03/2007   Essential hypertension 06/03/2007   INSOMNIA 06/03/2007    Jamey Reas, PT, DPT 11/09/2021, 1:50 PM  [facility] Physical Therapy [address] [place], [place], [address] Phone: [phone]   Fax:  [phone]  Name: [name] MRN: [id number] Date of Birth: 1956/05/07

## 2021-11-13 ENCOUNTER — Encounter: Payer: Medicare HMO | Admitting: Physical Therapy

## 2021-11-13 DIAGNOSIS — L308 Other specified dermatitis: Secondary | ICD-10-CM | POA: Diagnosis not present

## 2021-11-14 ENCOUNTER — Telehealth: Payer: Self-pay | Admitting: *Deleted

## 2021-11-14 ENCOUNTER — Other Ambulatory Visit: Payer: Self-pay | Admitting: Orthopaedic Surgery

## 2021-11-14 MED ORDER — OXYCODONE HCL 5 MG PO TABS: 5.0000 mg | ORAL_TABLET | ORAL | 0 refills | Status: DC | PRN

## 2021-11-14 NOTE — Telephone Encounter (Signed)
Patient requesting refill of pain medication. Thanks.

## 2021-11-15 NOTE — Telephone Encounter (Signed)
CM updated patient that this has been sent in.

## 2021-11-16 ENCOUNTER — Encounter: Payer: Self-pay | Admitting: Physical Therapy

## 2021-11-16 ENCOUNTER — Other Ambulatory Visit: Payer: Self-pay

## 2021-11-16 ENCOUNTER — Ambulatory Visit: Payer: Medicare HMO | Admitting: Physical Therapy

## 2021-11-16 DIAGNOSIS — M25662 Stiffness of left knee, not elsewhere classified: Secondary | ICD-10-CM

## 2021-11-16 DIAGNOSIS — M25562 Pain in left knee: Secondary | ICD-10-CM

## 2021-11-16 DIAGNOSIS — R2689 Other abnormalities of gait and mobility: Secondary | ICD-10-CM | POA: Diagnosis not present

## 2021-11-16 DIAGNOSIS — R2681 Unsteadiness on feet: Secondary | ICD-10-CM

## 2021-11-16 DIAGNOSIS — M6281 Muscle weakness (generalized): Secondary | ICD-10-CM

## 2021-11-16 DIAGNOSIS — R600 Localized edema: Secondary | ICD-10-CM

## 2021-11-16 NOTE — Therapy (Signed)
[facility] [address] [place], [place], [address] Phone: [phone]   Fax:  [phone]  Physical Therapy Treatment  Patient Details  Name: [name] MRN: [id number] Date of Birth: 23-Apr-1956 Referring Provider (PT): Jean Rosenthal, MD   Encounter Date: 11/16/2021   PT End of Session - 11/16/21 1538     Visit Number 3    Number of Visits 17    Date for PT Re-Evaluation 12/29/21    Authorization Type [employer]    Authorization Time Period $10 copay    Progress Note Due on Visit 10    PT Start Time 0232    PT Stop Time 0316    PT Time Calculation (min) 44 min    Activity Tolerance Patient tolerated treatment well    Behavior During Therapy [facility] for tasks assessed/performed             Past Medical History:  Diagnosis Date   AICD (automatic cardioverter/defibrillator) present    Anemia    supposed to be taking Vit B but doesn't   ANXIETY    takes Xanax nightly   Arthritis    Asthma    Albuterol prn and Advair daily;also takes Prednisone daily   Atrial fibrillation ([place]) 09/06/2015   Cardiomyopathy ([place])    a. EF 25% TEE July 2013; b. EF normalized 2015;  c. 03/2015 Echo: EF 40-45%, difrf HK, PASP 38 mmHg, Mild MR, sev LAE/RAE.   CHF (congestive heart failure) (HCC)    Chronic constipation    takes OTC stool softener   COPD (chronic obstructive pulmonary disease) (HCC)    "one dr says COPD; one dr says emphysema" (09/18/2017)   DEPRESSION    takes Zoloft and Doxepin daily   Diverticulitis    DYSKINESIA, ESOPHAGUS    Dysrhythmia    Atrial Fibrilation   Emphysema of lung [facility])    "one dr says COPD; one dr says emphysema" (09/18/2017)   Essential hypertension        FIBROMYALGIA    GERD (gastroesophageal reflux disease)        Glaucoma    HYPERLIPIDEMIA    a. Intolerant to statins.   INSOMNIA    takes Ambien nightly   Myocardial infarction [facility])    a. 2012 Myoview notable for prior infarct;  b. 03/2015  Lexiscan CL: EF 37%, diff HK, small area of inferior infarct from apex to base-->Med Rx.   Myocardial infarction ([place])    O2 dependent    "2.5L q hs & prn" (09/18/2017)   Paroxysmal atrial fibrillation ([place])    a. CHA2DS2VASc = 3--> takes Coumadin;  b. 03/15/2015 Successful TEE/DCCV;  c. 03/2015 recurrent afib, Amio d/c'd in setting of hyperthyroidism.   Peripheral neuropathy    Pneumonia 12/2016   Rash and other nonspecific skin eruption 04/12/2009   no cause found saw dermatologists x 2 and allergist   SLEEP APNEA, OBSTRUCTIVE    a. doesn't use CPAP   Syncope    a. 03/2015 s/p MDT LINQ.   Type II diabetes mellitus ([name])         Past Surgical History:  Procedure Laterality Date   ACNE CYST REMOVAL     2 on back    AV NODE ABLATION N/A 10/25/2017   Procedure: AV NODE ABLATION;  Surgeon: Deboraha Sprang, MD;  Location: [place] CV LAB;  Service: Cardiovascular;  Laterality: N/A;   BIV ICD INSERTION CRT-D N/A 09/18/2017   Procedure: BIV ICD INSERTION CRT-D;  Surgeon:  Deboraha Sprang, MD;  Location: [place] CV LAB;  Service: Cardiovascular;  Laterality: N/A;   CARDIAC CATHETERIZATION N/A 03/21/2016   Procedure: Right/Left Heart Cath and Coronary Angiography;  Surgeon: Larey Dresser, MD;  Location: [place] CV LAB;  Service: Cardiovascular;  Laterality: N/A;   CARDIOVERSION  04/18/2012   Procedure: CARDIOVERSION;  Surgeon: Fay Records, MD;  Location: [place];  Service: Cardiovascular;  Laterality: N/A;   CARDIOVERSION  04/25/2012   Procedure: CARDIOVERSION;  Surgeon: Thayer Headings, MD;  Location: [place];  Service: Cardiovascular;  Laterality: N/A;   CARDIOVERSION  04/25/2012   Procedure: CARDIOVERSION;  Surgeon: Fay Records, MD;  Location: [place];  Service: Cardiovascular;  Laterality: N/A;   CARDIOVERSION  05/09/2012   Procedure: CARDIOVERSION;  Surgeon: Sherren Mocha, MD;  Location: [place];  Service: Cardiovascular;  Laterality: N/A;  changed from crenshaw to cooper by  trish/leone-endo   CARDIOVERSION N/A 03/15/2015   Procedure: CARDIOVERSION;  Surgeon: Thayer Headings, MD;  Location: [place];  Service: Cardiovascular;  Laterality: N/A;   COLONOSCOPY     COLONOSCOPY WITH PROPOFOL N/A 10/21/2014   Procedure: COLONOSCOPY WITH PROPOFOL;  Surgeon: Ladene Artist, MD;  Location: [facility];  Service: Endoscopy;  Laterality: N/A;   EP IMPLANTABLE DEVICE N/A 04/06/2015   Procedure: Loop Recorder Insertion;  Surgeon: Evans Lance, MD;  Location: [place] CV LAB;  Service: Cardiovascular;  Laterality: N/A;   ESOPHAGOGASTRODUODENOSCOPY     JOINT REPLACEMENT     LOOP RECORDER REMOVAL N/A 09/18/2017   Procedure: LOOP RECORDER REMOVAL;  Surgeon: Deboraha Sprang, MD;  Location: [place] CV LAB;  Service: Cardiovascular;  Laterality: N/A;   RIGHT/LEFT HEART CATH AND CORONARY ANGIOGRAPHY N/A 01/28/2017   Procedure: Right/Left Heart Cath and Coronary Angiography;  Surgeon: Larey Dresser, MD;  Location: [place] CV LAB;  Service: Cardiovascular;  Laterality: N/A;   TEE WITHOUT CARDIOVERSION  04/25/2012   Procedure: TRANSESOPHAGEAL ECHOCARDIOGRAM (TEE);  Surgeon: Thayer Headings, MD;  Location: [place];  Service: Cardiovascular;  Laterality: N/A;   TEE WITHOUT CARDIOVERSION N/A 03/15/2015   Procedure: TRANSESOPHAGEAL ECHOCARDIOGRAM (TEE);  Surgeon: Thayer Headings, MD;  Location: [place];  Service: Cardiovascular;  Laterality: N/A;   TONSILLECTOMY AND ADENOIDECTOMY     TOTAL KNEE ARTHROPLASTY Right 06/15/2014   Procedure: TOTAL KNEE ARTHROPLASTY;  Surgeon: Renette Butters, MD;  Location: [place];  Service: Orthopedics;  Laterality: Right;   TOTAL KNEE ARTHROPLASTY Left 10/17/2021   Procedure: Left TOTAL KNEE ARTHROPLASTY;  Surgeon: Mcarthur Rossetti, MD;  Location: [place];  Service: Orthopedics;  Laterality: Left;    There were no vitals filed for this visit.   Subjective Assessment - 11/16/21 1531     Subjective Pt. states that the L knee is  feeling stiff today,  and the pain is 1-2/10.    Pertinent History OA, syncope, glaucoma, DM2    Limitations Lifting;Standing;Walking;House hold activities    Patient Stated Goals walking in community, play drum in church    Pain Score 2     Pain Onset 1 to 4 weeks ago                               [facility] Adult PT Treatment/Exercise - 11/16/21 0001       Ambulation/Gait   Ambulation/Gait Yes    Ambulation/Gait Assistance 6: Modified independent (Device/Increase time)    Assistive device Straight  cane    Gait Pattern Step-through pattern    Ambulation Surface Level;Indoor      Knee/Hip Exercises: Clinical research associate 3 reps;30 seconds;Both    Gastroc Stretch Limitations Slant board, 2 UE support at treadmill      Knee/Hip Exercises: Aerobic   Recumbent Bike seat 9 lvl 2 8 minutes      Knee/Hip Exercises: Standing   Forward Step Up 10 reps;2 sets;Hand Hold: 1;Step Height: 6"    Forward Step Up Limitations Stepping up w/ R down w/ L   (Progress to stepping up with L down w/ R next visit)     Knee/Hip Exercises: Seated   Long Arc Quad Both;2 sets;10 reps;Weights    Long Arc Quad Weight 3 lbs.    Other Seated Knee/Hip Exercises L Knee ext. with green band tied around both ankles, x 10    Hamstring Curl Left;2 sets;10 reps    Hamstring Limitations Green Theraband      Knee/Hip Exercises: Supine   Heel Slides AAROM;Left;10 reps    Heel Slides Limitations with stretch strap, 5 sec hold                     PT Education - 11/16/21 1538     Education Details Pt. HEP updates    Person(s) Educated Patient    Methods Explanation;Demonstration;Verbal cues;Handout    Comprehension Verbalized understanding;Returned demonstration;Need further instruction              PT Short Term Goals - 11/02/21 1355       PT SHORT TERM GOAL #1   Title patient demo & verbalizes understanding & compliance with HEP.    Time 4    Period Weeks    Status  New    Target Date 12/01/21      PT SHORT TERM GOAL #2   Title left knee PROM ext 0* to 100* flex    Time 4    Period Weeks    Status New    Target Date 12/01/21      PT SHORT TERM GOAL #3   Title Patient reports left knee pain </= 5/10    Time 4    Period Weeks    Status New    Target Date 12/01/21               PT Long Term Goals - 11/02/21 1352       PT LONG TERM GOAL #1   Title FOTO score >/= 60%    Time 8    Period Weeks    Status New    Target Date 12/29/21      PT LONG TERM GOAL #2   Title left knee pain </= 2/10 with standing & gait activities    Time 8    Period Weeks    Status New    Target Date 12/29/21      PT LONG TERM GOAL #3   Title Left knee AROM 0* ext to 105* flexion    Time 8    Period Weeks    Status New    Target Date 12/29/21      PT LONG TERM GOAL #4   Title Patient ambulates with 2 canes modified independent for community mobility including ramps, curbs & stairs.    Time 8    Period Weeks    Target Date 12/29/21  Plan - 11/16/21 1539     Clinical Impression Statement Introduced step up's to 6in step, pt. tolerated this well. He is showing great progress w/ L knee strength and ROM. Pt's HEP was updated to match progress being made.    Personal Factors and Comorbidities Comorbidity 3+;Fitness    Comorbidities HTN, Bundle branch block, pacemaker, anxiety, MI, cardiomyopathy, COPD, DJD, OA, syncope, glaucoma, DM2    Examination-Activity Limitations Carry;Locomotion Level;Sleep;Squat;Stairs;Stand;Transfers    Examination-Participation Restrictions Church;Community Activity;Occupation    Stability/Clinical Decision Making Evolving/Moderate complexity    Rehab Potential Good    PT Frequency 2x / week    PT Duration 8 weeks    PT Treatment/Interventions ADLs/Self Care Home Management;Cryotherapy;Electrical Stimulation;Moist Heat;Gait training;Stair training;Functional mobility training;Therapeutic  activities;Therapeutic exercise;Balance training;Neuromuscular re-education;DME Instruction;Patient/family education;Manual techniques;Scar mobilization;Passive range of motion;Vasopneumatic Device;Joint Manipulations    PT Next Visit Plan Ask pt. how updated HEP is going, manual therapy & therapeutic exercise to increase ROM    PT Home Exercise Plan Access Code: [email or web address]    Consulted and Agree with Plan of Care Patient             Patient will benefit from skilled therapeutic intervention in order to improve the following deficits and impairments:  Abnormal gait, Decreased activity tolerance, Decreased balance, Decreased endurance, Decreased knowledge of use of DME, Decreased mobility, Decreased range of motion, Decreased skin integrity, Decreased scar mobility, Decreased strength, Difficulty walking, Increased edema, Impaired flexibility, Postural dysfunction, Obesity, Pain  Visit Diagnosis: Acute pain of left knee  Stiffness of left knee, not elsewhere classified  Muscle weakness (generalized)  Localized edema  Other abnormalities of gait and mobility  Unsteadiness on feet     Problem List Patient Active Problem List   Diagnosis Date Noted   Unilateral primary osteoarthritis, left knee 10/17/2021   Status post total knee replacement, left 10/17/2021   Rib pain on right side 10/10/2021   History of colonic polyps 09/26/2021   Acute cough 07/21/2021   Bursitis of left hip 06/13/2021   Tooth fracture 06/11/2021   Pain and swelling of right lower leg 05/18/2021   Hemorrhagic shock ([place]) 05/05/2021   Gastrointestinal hemorrhage    Diverticulitis of colon    Preop exam for internal medicine 03/04/2021   B12 deficiency 03/04/2021   Vitamin D deficiency 03/01/2021   Myalgia 12/01/2020   Laceration of left leg 09/17/2020   Statin myopathy 08/02/2020   Rotator cuff arthropathy of both shoulders 02/23/2020   (HFpEF) heart failure with preserved ejection fraction ([place])  02/22/2020   ICD (implantable cardioverter-defibrillator) in place 12/27/2019   COVID-19 virus infection 12/09/2019   COVID-19 12/09/2019   Hypotension 12/09/2019   Upper respiratory infection 11/27/2019   COPD exacerbation ([place]) 11/27/2019   Elevated PSA 09/23/2019   Lip lesion 12/23/2018   Diabetic ulcer of left lower leg with necrosis of muscle ([place]) 12/05/2018   Chronic bursitis of left shoulder 07/01/2018   Pain due to total knee replacement ([place]) 03/31/2018   Rotator cuff arthropathy, left 02/13/2018   Right rotator cuff tear arthropathy 01/14/2018   Increased prostate specific antigen (PSA) velocity 09/26/2017   Trigger point of thoracic region 07/04/2017   Right lumbar radiculopathy 05/15/2017   Right leg swelling 05/09/2017   Glaucoma 04/18/2017   History of nasal polyposis 02/19/2017   Chronic rhinitis 02/19/2017   Microhematuria 12/04/2016   Hyperlipidemia 08/15/2016   Encounter for well adult exam with abnormal findings 11/29/2015   Persistent atrial fibrillation ([place]) 03/30/2015   Syncope 03/30/2015  Acute on chronic diastolic heart failure ([place]) 03/30/2015   Former tobacco use 02/25/2015   Type 2 diabetes mellitus ([place]) 02/25/2015   Atrial fibrillation ([place]) 02/25/2015   Anemia 02/23/2015   Thyrotoxicosis 02/23/2015   Hx of adenomatous colonic polyps 10/21/2014   De Quervain's tenosynovitis, right 04/30/2014   Osteoarthritis of right knee 02/19/2014   Encounter for therapeutic drug monitoring 11/17/2013   Thoracic degenerative disc disease 06/17/2013   Thoracic spondylosis without myelopathy 02/11/2013   Lumbosacral spondylosis without myelopathy 02/11/2013   Degenerative joint disease of knee, left 02/11/2013   COPD (chronic obstructive pulmonary disease) ([place]) 08/20/2012   Bundle branch block, right and extreme left anterior fascicular 05/05/2012   Nonischemic cardiomyopathy ([place]) 05/02/2012   Other primary cardiomyopathies 05/02/2012   Anticoagulated on  warfarin 03/21/2012   Spongiotic dermatitis 09/26/2011   Past myocardial infarction 04/19/2011   UNSPECIFIED URETHRAL STRICTURE 12/26/2010   Chronic pain syndrome 05/18/2010   Obstructive sleep apnea 10/21/2007   NEPHROLITHIASIS, HX OF 10/21/2007   Anxiety 06/09/2007   GERD (gastroesophageal reflux disease) 06/09/2007   Benign prostatic hyperplasia 06/09/2007   Morbid obesity ([place]) 06/03/2007   Depression 06/03/2007   Essential hypertension 06/03/2007   INSOMNIA 96/01/5408    Travious Vanover Singer, Student-PT 11/16/2021, 4:16 PM  [facility] [address] [place], [place], [address] Phone: [phone]   Fax:  [phone]  Name: [name] MRN: [id number] Date of Birth: 07-15-1956

## 2021-11-20 ENCOUNTER — Ambulatory Visit (INDEPENDENT_AMBULATORY_CARE_PROVIDER_SITE_OTHER): Payer: Medicare HMO | Admitting: *Deleted

## 2021-11-20 ENCOUNTER — Ambulatory Visit (INDEPENDENT_AMBULATORY_CARE_PROVIDER_SITE_OTHER): Payer: Medicare HMO

## 2021-11-20 DIAGNOSIS — I5022 Chronic systolic (congestive) heart failure: Secondary | ICD-10-CM

## 2021-11-20 DIAGNOSIS — Z95810 Presence of automatic (implantable) cardiac defibrillator: Secondary | ICD-10-CM | POA: Diagnosis not present

## 2021-11-20 DIAGNOSIS — I255 Ischemic cardiomyopathy: Secondary | ICD-10-CM

## 2021-11-20 DIAGNOSIS — I4819 Other persistent atrial fibrillation: Secondary | ICD-10-CM

## 2021-11-20 NOTE — Chronic Care Management (AMB) (Signed)
Chronic Care Management   CCM RN Visit Note  11/20/2021 Name: [name] MRN: [id number] DOB: [birth year]  Subjective: [name] is a 66 y.o. year old male who is a primary care patient of [name], MD. The care management team was consulted for assistance with disease management and care coordination needs.    Engaged with patient by telephone for follow up visit in response to provider referral for case management and/or care coordination services.   Consent to Services:  The patient was given information about Chronic Care Management services, agreed to services, and gave verbal consent prior to initiation of services.  Please see initial visit note for detailed documentation.  Patient agreed to services and verbal consent obtained.   Assessment: Review of patient past medical history, allergies, medications, health status, including review of consultants reports, laboratory and other test data, was performed as part of comprehensive evaluation and provision of chronic care management services.   SDOH (Social Determinants of Health) assessments and interventions performed:  SDOH Interventions    Flowsheet Row Most Recent Value  SDOH Interventions   Food Insecurity Interventions Intervention Not Indicated  [denies food insecurity]  Transportation Interventions Intervention Not Indicated  [Patient continues driving self]     CCM Care Plan  Allergies  Allergen Reactions   Amiodarone Other (See Comments)    hyperthyroidism   Statins Other (See Comments)    myalgia   Tape Other (See Comments)    Skin Tears Use Paper Tape Only   Outpatient Encounter Medications as of 11/20/2021  Medication Sig   acetaminophen (TYLENOL) 500 MG tablet Take 500 mg by mouth every 6 (six) hours as needed for moderate pain.   albuterol (VENTOLIN HFA) 108 (90 Base) MCG/ACT inhaler INHALE 2 PUFFS BY MOUTH 4 TIMES A DAY AS NEEDED   ALPRAZolam (XANAX) 0.5 MG tablet TAKE 2 TABLETS BY MOUTH AT  BEDTIME   AMBULATORY NON FORMULARY MEDICATION Rolling walker with a bench   apixaban (ELIQUIS) 5 MG TABS tablet Take 1 tablet (5 mg total) by mouth 2 (two) times daily.   augmented betamethasone dipropionate (DIPROLENE-AF) 0.05 % cream Apply 1 application topically daily as needed (itching).   carisoprodol (SOMA) 350 MG tablet TAKE 1 TABLET BY MOUTH THREE TIMES A DAY AS NEEDED FOR MUSCLE SPASMS   carvedilol (COREG) 6.25 MG tablet Take 1 tablet (6.25 mg total) by mouth 2 (two) times daily.   Cholecalciferol 100 MCG (4000 UT) CAPS 1 tab by mouth once daily   dapagliflozin propanediol (FARXIGA) 10 MG TABS tablet Take 1 tablet (10 mg total) by mouth daily before breakfast.   doxepin (SINEQUAN) 10 MG capsule TAKE 2 CAPSULES (20 MG TOTAL) BY MOUTH AT BEDTIME AS NEEDED.   Dupilumab (DUPIXENT) 300 MG/2ML SOPN Inject 300 mg into the skin every 14 (fourteen) days.   eplerenone (INSPRA) 50 MG tablet Take 1 tablet (50 mg total) by mouth daily.   Evolocumab (REPATHA SURECLICK) 924 MG/ML SOAJ INJECT 1 PEN INTO THE SKIN EVERY 14 (FOURTEEN) DAYS.   fluticasone (CUTIVATE) 0.05 % cream SMARTSIG:Topical 1-2 Times Daily PRN   fluticasone (FLONASE) 50 MCG/ACT nasal spray PLACE 2 SPRAYS INTO BOTH NOSTRILS DAILY AS NEEDED FOR ALLERGIES OR RHINITIS.   hydrOXYzine (ATARAX/VISTARIL) 10 MG tablet TAKE 1 TABLET BY MOUTH 3 TIMES DAILY AS NEEDED FOR ITCHING.   ketoconazole (NIZORAL) 2 % cream APPLY 1 APPLICATION TOPICALLY DAILY (Patient taking differently: Apply 1 application topically daily as needed (dry skin).)   KLOR-CON M20 20  MEQ tablet TAKE 1 TABLET BY MOUTH EVERY DAY (Patient taking differently: Take 40 mEq by mouth daily.)   lidocaine (LIDODERM) 5 % APPLY 1 PATCH TO AFFECTED AREA FOR UP TO 12 HOURS . DO NOT USE MORE THAN ONE PATCH IN 24 HOURS.   losartan (COZAAR) 25 MG tablet Take 0.5 tablets (12.5 mg total) by mouth daily.   metolazone (ZAROXOLYN) 2.5 MG tablet Take 1 tablet (2.5 mg total) by mouth as directed. With  additional 40 meq of potassium (Patient taking differently: Take 2.5 mg by mouth daily. With additional 40 meq of potassium)   omeprazole (PRILOSEC) 20 MG capsule TAKE 1 CAPSULE BY MOUTH TWICE A DAY   oxyCODONE (OXY IR/ROXICODONE) 5 MG immediate release tablet Take 1-2 tablets (5-10 mg total) by mouth every 4 (four) hours as needed for moderate pain (pain score 4-6).   tamsulosin (FLOMAX) 0.4 MG CAPS capsule Take 0.4 mg by mouth daily.   torsemide (DEMADEX) 20 MG tablet Take 60-80 mg by mouth See admin instructions. Take 80 mg by mouth in the morning and 60 mg in the evening.   traZODone (DESYREL) 100 MG tablet 2 tab by mouth at bedtime as needed   triamcinolone (KENALOG) 0.1 % paste Use as directed in the mouth or throat 2 (two) times daily.   TUMS 500 MG chewable tablet Chew 500-2,000 mg by mouth every 4 (four) hours as needed for indigestion or heartburn.    venlafaxine XR (EFFEXOR-XR) 75 MG 24 hr capsule Take 1 capsule (75 mg total) by mouth daily with breakfast.   zolpidem (AMBIEN) 10 MG tablet TAKE 1 TABLET BY MOUTH EVERY DAY AT BEDTIME AS NEEDED   No facility-administered encounter medications on file as of 11/20/2021.   Patient Active Problem List   Diagnosis Date Noted   Unilateral primary osteoarthritis, left knee 10/17/2021   Status post total knee replacement, left 10/17/2021   Rib pain on right side 10/10/2021   History of colonic polyps 09/26/2021   Acute cough 07/21/2021   Bursitis of left hip 06/13/2021   Tooth fracture 06/11/2021   Pain and swelling of right lower leg 05/18/2021   Hemorrhagic shock ([place]) 05/05/2021   Gastrointestinal hemorrhage    Diverticulitis of colon    Preop exam for internal medicine 03/04/2021   B12 deficiency 03/04/2021   Vitamin D deficiency 03/01/2021   Myalgia 12/01/2020   Laceration of left leg 09/17/2020   Statin myopathy 08/02/2020   Rotator cuff arthropathy of both shoulders 02/23/2020   (HFpEF) heart failure with preserved ejection  fraction ([place]) 02/22/2020   ICD (implantable cardioverter-defibrillator) in place 12/27/2019   COVID-19 virus infection 12/09/2019   COVID-19 12/09/2019   Hypotension 12/09/2019   Upper respiratory infection 11/27/2019   COPD exacerbation ([place]) 11/27/2019   Elevated PSA 09/23/2019   Lip lesion 12/23/2018   Diabetic ulcer of left lower leg with necrosis of muscle ([place]) 12/05/2018   Chronic bursitis of left shoulder 07/01/2018   Pain due to total knee replacement ([place]) 03/31/2018   Rotator cuff arthropathy, left 02/13/2018   Right rotator cuff tear arthropathy 01/14/2018   Increased prostate specific antigen (PSA) velocity 09/26/2017   Trigger point of thoracic region 07/04/2017   Right lumbar radiculopathy 05/15/2017   Right leg swelling 05/09/2017   Glaucoma 04/18/2017   History of nasal polyposis 02/19/2017   Chronic rhinitis 02/19/2017   Microhematuria 12/04/2016   Hyperlipidemia 08/15/2016   Encounter for well adult exam with abnormal findings 11/29/2015   Persistent atrial fibrillation ([place])  03/30/2015   Syncope 03/30/2015   Acute on chronic diastolic heart failure ([place]) 03/30/2015   Former tobacco use 02/25/2015   Type 2 diabetes mellitus ([place]) 02/25/2015   Atrial fibrillation ([place]) 02/25/2015   Anemia 02/23/2015   Thyrotoxicosis 02/23/2015   Hx of adenomatous colonic polyps 10/21/2014   De Quervain's tenosynovitis, right 04/30/2014   Osteoarthritis of right knee 02/19/2014   Encounter for therapeutic drug monitoring 11/17/2013   Thoracic degenerative disc disease 06/17/2013   Thoracic spondylosis without myelopathy 02/11/2013   Lumbosacral spondylosis without myelopathy 02/11/2013   Degenerative joint disease of knee, left 02/11/2013   COPD (chronic obstructive pulmonary disease) ([place]) 08/20/2012   Bundle branch block, right and extreme left anterior fascicular 05/05/2012   Nonischemic cardiomyopathy ([place]) 05/02/2012   Other primary cardiomyopathies 05/02/2012    Anticoagulated on warfarin 03/21/2012   Spongiotic dermatitis 09/26/2011   Past myocardial infarction 04/19/2011   UNSPECIFIED URETHRAL STRICTURE 12/26/2010   Chronic pain syndrome 05/18/2010   Obstructive sleep apnea 10/21/2007   NEPHROLITHIASIS, HX OF 10/21/2007   Anxiety 06/09/2007   GERD (gastroesophageal reflux disease) 06/09/2007   Benign prostatic hyperplasia 06/09/2007   Morbid obesity ([place]) 06/03/2007   Depression 06/03/2007   Essential hypertension 06/03/2007   INSOMNIA 06/03/2007   Conditions to be addressed/monitored:  Atrial Fibrillation and CHF  Care Plan : Heart Failure (Adult)  Updates made by Knox Royalty, RN since 11/20/2021 12:00 AM     Problem: Symptom Exacerbation (Heart Failure)   Priority: Medium     Long-Range Goal: Symptom Exacerbation of CHF/ A-Fib Prevented or Minimized   Start Date: 04/27/2021  Expected End Date: 04/27/2022  Recent Progress: On track  Priority: Medium  Note:   Current Barriers:  Ongoing need for reinforcement and support in self-health management of heart failure in setting of multiple co-morbidities, including persistent Atrial Fibrillation Fragile state of health, multiple progressing chronic health conditions Hospitalization July 22-27, 2022 for GI bleeding Nurse Case Manager Clinical Goal(s):  04/27/21: Over the next 12 months, patient will verbalize ongoing understanding of Heart Failure and Atrial Fibrillation Action Plan and when to call doctor, as evidenced by patient/ caregiver reporting during CCM RN CM outreach of: Completion of daily weight monitoring/ recording (notifying MD of 3 lb weight gain over night or 5 lb in a week) Adhering to low salt diet Taking prescribed medications as prescribed Following action plan for development of Atrial Fibrillation or other heart rhythm disturbances at home Interventions:  Collaboration with [name], MD regarding development and update of comprehensive plan of care as  evidenced by provider attestation and co-signature Inter-disciplinary care team collaboration (see longitudinal plan of care) Chart reviewed including relevant office notes, upcoming scheduled appointments, and lab results  11/20/21- Goal completed due to duplication of goals, progression of care plan with conversion to new format; goals re-established and extended     Care Plan : [place] of Care  Updates made by Knox Royalty, RN since 11/20/2021 12:00 AM     Problem: Chronic Disease Management Needs   Priority: High     Long-Range Goal: Ongoing adherence to established plan of care for long term chronic disease management   Start Date: 11/20/2021  Expected End Date: 11/20/2022  Priority: High  Note:   Current Barriers:  Chronic Disease Management support and education needs related to Atrial Fibrillation and CHF Fragile state of health, multiple progressing chronic health conditions Hospitalization July 22-27, 2022 for GI bleeding  RNCM Clinical Goal(s):  Patient will demonstrate ongoing health management independence as evidenced by adherence to established plan of care for A-F and CHF        through collaboration with RN Care manager, provider, and care team.   Interventions: 1:1 collaboration with primary care provider regarding development and update of comprehensive plan of care as evidenced by provider attestation and co-signature Inter-disciplinary care team collaboration (see longitudinal plan of care) Evaluation of current treatment plan related to  self management and patient's adherence to plan as established by provider SDOH Updated: no new/ unmet needs identified Pain Assessment updated: denies unmanageable pain post recent (L) TKR- occasionally taking NSAIDS as per orthopedic provider instructions Falls assessment updated: reports one new fall without injury post- last outreach: states tripped at Christmas, fell; daughter and son-in-law assisted him up;  denies serious injury; has since had elective (L) TKR, no falls post- surgery; continues using cane and attending outpatient rehabilitation; previously provided education around fall risks/ prevention reinforced Reviewed post-op course post recent elective (L) TRK: reports doing well, happy to have procedure behind him; attending outpatient rehabilitation and tolerating well, but reports vague back discomfort after last session: he has scheduled PT appointment tomorrow and plans to discuss with care providers prior to beginning session: this was encouraged Confirmed patient attended post-op orthopedic provider office visit 11/01/21- "got a good report;" verbalizes good understanding of plan of care post- office visit Confirmed next appointment 11/29/21- patient aware, plans at attend as scheduled  Heart Failure Interventions:  (Status: 11/20/21: Goal on Track (progressing): YES.)  Long Term Goal  Wt Readings from Last 3 Encounters:  10/24/21 249 lb (112.9 kg)  10/17/21 248 lb (112.5 kg)  10/10/21 248 lb (112.5 kg)  Basic overview and discussion of pathophysiology of Heart Failure reviewed Discussed importance of daily weight and advised patient to weigh and record daily Reviewed role of diuretics in prevention of fluid overload and management of heart failure Discussed the importance of keeping all appointments with provider Provided patient with education about the role of exercise in the management of heart failure Confirmed patient continues to monitor/ record daily weights at home: he reports consistent weight ranges between 243-245 lbs, with a reported weight today of 244 lbs Confirmed no clinical concerns around swelling outside of baseline, shortness of breath; reports "doing better than I have been in awhile;" reports occasional use of rescue inhaler; states "have not used recently, usually use once every week or two" Reinforced previously provided education around signs/ symptoms CHF yellow  zone along with corresponding action plan Confirmed patient has had no recent medication changes; denies medication concerns: continues independently self-managing medications Discussed patient's stated plans to begin independently exercising once rehabilitation sessions are completed: this was encouraged, but I also encouraged patient to start conservatively/ gradually and not over-do; importance of safe/ fall-free exercise stressed- he verbalizes understanding/ agreement Confirmed patient continues following heart healthy low salt diet- positive reinforcement provided with encouragement to continue efforts Reviewed recent PCP office visit 09/21/21: confirmed no questions/ concerns; briefly reviewed lab results with patient Reviewed upcoming provider office visits with patient and confirmed he has plans to attend all as scheduled: 11/28/21- podiatry; 11/29/21- orthopedic surgeon; 12/15/21- cardiology/ CHF provider; 12/19/21- sports medicine provider; 12/20/21- PCP office visit  AFIB Interventions: (Status:  Goal on track:  Yes.) Long Term Goal Counseled on increased risk of stroke due to Afib and benefits of anticoagulation for stroke prevention Reviewed importance of adherence to anticoagulant exactly as prescribed Signs/ symptoms development of A-F and  Afib action plan reviewed: patient denies recent signs/ symptoms A-Fib  Patient Goals/Self-Care Activities: As evidenced by review of EHR, collaboration with care team, and patient reporting during CCM RN CM outreach,  Patient Abass "Truman Hayward" will: Continue attending your rehabilitation sessions as scheduled When you begin to start your own exercise program: please start slow and conservatively; gradually build up your exercise program over time Take precautions to prevent falls- continue using your cane as needed, especially as your are recovering from your recent knee surgery Continue to weigh yourself daily and write your weights at home down on paper  or on the calendar- we will review these each time we have a telephone visit.  Today you reported a weight of 244 lbs Call office if I gain more than 2 pounds in one day or 5 pounds in one week, especially if this is associated with chest tightness, swelling in your legs/ ankles, and new or worsened shortness of breath Take your medications as prescribed; call your doctors if you develop concerns around your medications Watch for swelling in feet, ankles and legs every day: contact your cardiologist if you believe your swelling is increasing Keep legs up while sitting Continue to limit the salt in your diet: decrease your salt intake and use salt only in moderation If you develop signs or symptoms of being in Atrial Fibrillation or having heart rhythm disturbances, call your cardiology (heart doctor) team right away       Plan: Telephone follow up appointment with care management team member scheduled for:  Thursday, December 28, 2021 at 2:15 pm The patient has been provided with contact information for the care management team and has been advised to call with any health related questions or concerns  [name], RN, BSN, [place] [phone]: direct office

## 2021-11-20 NOTE — Patient Instructions (Signed)
Visit Information ° °Lee, thank you for taking time to talk with me today. Please don't hesitate to contact me if I can be of assistance to you before our next scheduled telephone appointment. ° °Below are the goals we discussed today:  °Patient Self-Care Activities: °Patient Zachary "Lee" will: °Continue attending your rehabilitation sessions as scheduled °When you begin to start your own exercise program: please start slow and conservatively; gradually build up your exercise program over time °Take precautions to prevent falls- continue using your cane as needed, especially as your are recovering from your recent knee surgery °Continue to weigh yourself daily and write your weights at home down on paper or on the calendar- we will review these each time we have a telephone visit.  Today you reported a weight of 244 lbs °Call office if I gain more than 2 pounds in one day or 5 pounds in one week, especially if this is associated with chest tightness, swelling in your legs/ ankles, and new or worsened shortness of breath °Take your medications as prescribed; call your doctors if you develop concerns around your medications °Watch for swelling in feet, ankles and legs every day: contact your cardiologist if you believe your swelling is increasing °Keep legs up while sitting °Continue to limit the salt in your diet: decrease your salt intake and use salt only in moderation °If you develop signs or symptoms of being in Atrial Fibrillation or having heart rhythm disturbances, call your cardiology (heart doctor) team right away ° °Our next scheduled telephone follow up visit/ appointment with care management team member is scheduled on:   Thursday, December 28, 2021 at 2:15 pm- This is a PHONE CALL appointment ° °If you need to cancel or re-schedule our visit, please call [phone] and our care guide team will be happy to assist you. °  °I look forward to hearing about your progress. °  °Marielys Trinidad Mckinney Lenzie Montesano, RN, BSN,  CCRN Alumnus °[facility] RN Care Coordination- [facility] °[phone]: direct office ° °If you are experiencing a Mental Health or Behavioral Health Crisis or need someone to talk to, please  °call the Suicide and Crisis Lifeline: 988 °call the [place] National Suicide Prevention Lifeline: 1-800-273-8255 or TTY: 1-800-799-4 TTY (1-800-799-4889) to talk to a trained counselor °call 1-800-273-TALK (toll free, 24 hour hotline) °go to [facility] [address], [place] ([phone]) °call 911  ° °Patient verbalizes understanding of instructions and care plan provided today and agrees to view in MyChart. Active MyChart status confirmed with patient  ° °Physical Activity With Heart Disease °Being active has many benefits, especially if you have heart disease. Physical activity can help you do more and feel healthier. Start slowly, and increase the amount of time you spend being active. You should aim for physical activity that: °Makes you breathe harder and raises your heart rate (aerobic activity). Try to get at least 150 minutes of aerobic activity each week. This is about 30 minutes each day, 5 days a week. °Helps build muscle strength (strengthening activity). Do this at least 2 times a week. °A good rule of thumb is to work hard enough to breathe harder but still be able to carry on a conversation. If you can sing, you may not be working hard enough. °You may also want to monitor your heart rate (pulse) and blood pressure. Ask your health care provider what kind of tools you will need to track these. °Always talk with your health care   provider before starting any new activity program or if you have any changes in your condition. What are the benefits of physical activity? Physical activity can help improve your heart and blood vessel (cardiovascular) health. It can: Lower your blood pressure. Lower your cholesterol. Control your weight. Help control your blood  sugar. Improve the function of your heart and lungs. Reduce your risk of developing blood clots. Physical activity can help improve other aspects of your health. It can: Prevent bone loss. Improve your sleep. Improve your energy level. Reduce stress. What are some types of physical activity I could try? There are many ways to be active. Talk with your health care provider about what types and intensity of activity is right for you. Aerobic activity Aerobic (cardiovascular) activity can be moderate or vigorous intensity, depending on how hard you are working. Moderate-intensity activity includes: Walking. Slow bicycling. Water aerobics. Dancing. Light gardening or house work. Vigorous-intensity activity includes: Jogging or running. Stair climbing. Swimming laps. Hiking uphill. Heavy gardening, such as digging trenches.  Strengthening activity Strengthening activities work your muscles to build strength. Some examples include: Doing push-ups, sit-ups, or pull-ups. Lifting small weights. Using resistance bands. Yoga.  Flexibility Flexibility activities lengthen your muscles to keep them flexible and less tight and improve your balance. Some examples include: Stretching. Yoga. Tai chi. [place] barre.  Follow these instructions at home: How to get started Talk with your health care provider about: What types of activities are safe for you. If you should check your pulse or take other precautions during physical activity. Get a calendar. Write down a schedule and plan for your new routine. At the start of your workout, as well as at the end, remember to warm up and cool down to allow a gradual increase or decrease in heart rate and breathing. If you have not been active, begin with sessions that last 10-15 minutes. Gradually work up to sessions that last 20-30 minutes, 5 times a week. Follow all of your health care provider's recommendations. Take time to find out what works  for you. Consider the following: Join a community program, such as a biking group, yoga class, local gym, or swimming pool membership. Be active on your own by downloading free workout applications on a smartphone or other devices, or by purchasing workout DVDs. Be patient with yourself. It takes time to build up strength and lung capacity. Safety Exercise in an indoor, climate-controlled facility, as told by your health care provider. You may need to do this if: There are extreme outdoor conditions, such as heat, humidity, or cold. There is an air pollution advisory. Your local news, board of health, or hospital can provide information on air quality. Take extra precautions as told by your health care provider. This may include: Monitoring your heart rate. Avoiding heavy lifting. Understanding how your medicines can affect you during physical activity. Certain medicines may cause heat intolerance or changes in blood sugar. Slowing down to rest when you need to. Keeping nitroglycerin spray and tablets with you at all times if you have angina. Use them as told to prevent and treat symptoms. Drink plenty of water before, during, and after physical activity. Know what symptoms may be signs of a problem and stop physical activity right away if you have any of those symptoms. Where to find more information American Heart Association: www.heart.org U.S. Department of Health and Human Services: MissingBag.si Get help right away if you have any of the following during exercise: Chest pain, shortness of  breath, or feel very tired. Pain in the arm, shoulder, neck, or jaw. You feel weak, dizzy, or light-headed. An irregular heart rate, or your heart rate is greater than 100 beats per minute (bpm) before exercise. These symptoms may represent a serious problem that is an emergency. Do not wait to see if the symptoms go away. Get medical help right away. Call your local emergency services (911 in the  U.S.). Do not drive yourself to the hospital.  Summary Physical activity has many benefits, especially if you have heart disease. Before starting an activity program, talk with your health care provider about how often to be active and what type of activity is safe for you. Your physical activity plan may include moderate or vigorous aerobic activity, strengthening activities, and flexibility. Know what symptoms may be signs of a problem. Stop physical activity right away and call emergency services (911 in the U.S[place] if you have any of those symptoms. This information is not intended to replace advice given to you by your health care provider. Make sure you discuss any questions you have with your health care provider. Document Revised: 03/14/2021 Document Reviewed: 03/14/2021 Elsevier Patient Education  [place].

## 2021-11-21 ENCOUNTER — Encounter: Payer: Self-pay | Admitting: Physical Therapy

## 2021-11-21 ENCOUNTER — Other Ambulatory Visit: Payer: Self-pay

## 2021-11-21 ENCOUNTER — Ambulatory Visit: Payer: Medicare HMO | Admitting: Physical Therapy

## 2021-11-21 DIAGNOSIS — R2681 Unsteadiness on feet: Secondary | ICD-10-CM

## 2021-11-21 DIAGNOSIS — R2689 Other abnormalities of gait and mobility: Secondary | ICD-10-CM

## 2021-11-21 DIAGNOSIS — M6281 Muscle weakness (generalized): Secondary | ICD-10-CM | POA: Diagnosis not present

## 2021-11-21 DIAGNOSIS — M25662 Stiffness of left knee, not elsewhere classified: Secondary | ICD-10-CM | POA: Diagnosis not present

## 2021-11-21 DIAGNOSIS — R600 Localized edema: Secondary | ICD-10-CM

## 2021-11-21 DIAGNOSIS — M25562 Pain in left knee: Secondary | ICD-10-CM | POA: Diagnosis not present

## 2021-11-21 NOTE — Therapy (Addendum)
[facility] Physical Therapy [address] [name], [place], [address] Phone: [phone]   Fax:  [phone]  Physical Therapy Treatment/ Discharge Addendum PHYSICAL THERAPY DISCHARGE SUMMARY  Visits from Start of Care: 4  Current functional level related to goals / functional outcomes: See below   Remaining deficits: See below   Education / Equipment: See below  Plan: Patient agrees to discharge.  Patient goals were partially met. Patient is being discharged due to being pleased with functional level and released by MD.        Patient Details  Name: [name] MRN: [id number] Date of Birth: 1956/03/25 Referring Provider (PT): Jean Rosenthal, MD   Encounter Date: 11/21/2021   PT End of Session - 11/21/21 1613     Visit Number 4    Number of Visits 17    Date for PT Re-Evaluation 12/29/21    Authorization Type [employer]    Authorization Time Period $10 copay    Progress Note Due on Visit 10    PT Start Time 0318    PT Stop Time 0400    PT Time Calculation (min) 42 min    Activity Tolerance Patient tolerated treatment well    Behavior During Therapy [facility] for tasks assessed/performed             Past Medical History:  Diagnosis Date   AICD (automatic cardioverter/defibrillator) present    Anemia    supposed to be taking Vit B but doesn't   ANXIETY    takes Xanax nightly   Arthritis    Asthma    Albuterol prn and Advair daily;also takes Prednisone daily   Atrial fibrillation ([place]) 09/06/2015   Cardiomyopathy ([place])    a. EF 25% TEE July 2013; b. EF normalized 2015;  c. 03/2015 Echo: EF 40-45%, difrf HK, PASP 38 mmHg, Mild MR, sev LAE/RAE.   CHF (congestive heart failure) (HCC)    Chronic constipation    takes OTC stool softener   COPD (chronic obstructive pulmonary disease) (HCC)    "one dr says COPD; one dr says emphysema" (09/18/2017)   DEPRESSION    takes Zoloft and Doxepin daily   Diverticulitis    DYSKINESIA,  ESOPHAGUS    Dysrhythmia    Atrial Fibrilation   Emphysema of lung [facility])    "one dr says COPD; one dr says emphysema" (09/18/2017)   Essential hypertension        FIBROMYALGIA    GERD (gastroesophageal reflux disease)        Glaucoma    HYPERLIPIDEMIA    a. Intolerant to statins.   INSOMNIA    takes Ambien nightly   Myocardial infarction [facility])    a. 2012 Myoview notable for prior infarct;  b. 03/2015 Lexiscan CL: EF 37%, diff HK, small area of inferior infarct from apex to base-->Med Rx.   Myocardial infarction ([place])    O2 dependent    "2.5L q hs & prn" (09/18/2017)   Paroxysmal atrial fibrillation ([place])    a. CHA2DS2VASc = 3--> takes Coumadin;  b. 03/15/2015 Successful TEE/DCCV;  c. 03/2015 recurrent afib, Amio d/c'd in setting of hyperthyroidism.   Peripheral neuropathy    Pneumonia 12/2016   Rash and other nonspecific skin eruption 04/12/2009   no cause found saw dermatologists x 2 and allergist   SLEEP APNEA, OBSTRUCTIVE    a. doesn't use CPAP   Syncope    a. 03/2015 s/p MDT LINQ.   Type II diabetes mellitus ([place])  Past Surgical History:  Procedure Laterality Date   ACNE CYST REMOVAL     2 on back    AV NODE ABLATION N/A 10/25/2017   Procedure: AV NODE ABLATION;  Surgeon: Deboraha Sprang, MD;  Location: [place] CV LAB;  Service: Cardiovascular;  Laterality: N/A;   BIV ICD INSERTION CRT-D N/A 09/18/2017   Procedure: BIV ICD INSERTION CRT-D;  Surgeon: Deboraha Sprang, MD;  Location: [place] CV LAB;  Service: Cardiovascular;  Laterality: N/A;   CARDIAC CATHETERIZATION N/A 03/21/2016   Procedure: Right/Left Heart Cath and Coronary Angiography;  Surgeon: Larey Dresser, MD;  Location: [place] CV LAB;  Service: Cardiovascular;  Laterality: N/A;   CARDIOVERSION  04/18/2012   Procedure: CARDIOVERSION;  Surgeon: Fay Records, MD;  Location: [place];  Service: Cardiovascular;  Laterality: N/A;   CARDIOVERSION  04/25/2012   Procedure: CARDIOVERSION;  Surgeon: Thayer Headings, MD;  Location: [place];  Service: Cardiovascular;  Laterality: N/A;   CARDIOVERSION  04/25/2012   Procedure: CARDIOVERSION;  Surgeon: Fay Records, MD;  Location: [place];  Service: Cardiovascular;  Laterality: N/A;   CARDIOVERSION  05/09/2012   Procedure: CARDIOVERSION;  Surgeon: Sherren Mocha, MD;  Location: [place];  Service: Cardiovascular;  Laterality: N/A;  changed from crenshaw to cooper by trish/leone-endo   CARDIOVERSION N/A 03/15/2015   Procedure: CARDIOVERSION;  Surgeon: Thayer Headings, MD;  Location: [place];  Service: Cardiovascular;  Laterality: N/A;   COLONOSCOPY     COLONOSCOPY WITH PROPOFOL N/A 10/21/2014   Procedure: COLONOSCOPY WITH PROPOFOL;  Surgeon: Ladene Artist, MD;  Location: [facility];  Service: Endoscopy;  Laterality: N/A;   EP IMPLANTABLE DEVICE N/A 04/06/2015   Procedure: Loop Recorder Insertion;  Surgeon: Evans Lance, MD;  Location: [place] CV LAB;  Service: Cardiovascular;  Laterality: N/A;   ESOPHAGOGASTRODUODENOSCOPY     JOINT REPLACEMENT     LOOP RECORDER REMOVAL N/A 09/18/2017   Procedure: LOOP RECORDER REMOVAL;  Surgeon: Deboraha Sprang, MD;  Location: [place] CV LAB;  Service: Cardiovascular;  Laterality: N/A;   RIGHT/LEFT HEART CATH AND CORONARY ANGIOGRAPHY N/A 01/28/2017   Procedure: Right/Left Heart Cath and Coronary Angiography;  Surgeon: Larey Dresser, MD;  Location: [place] CV LAB;  Service: Cardiovascular;  Laterality: N/A;   TEE WITHOUT CARDIOVERSION  04/25/2012   Procedure: TRANSESOPHAGEAL ECHOCARDIOGRAM (TEE);  Surgeon: Thayer Headings, MD;  Location: [place];  Service: Cardiovascular;  Laterality: N/A;   TEE WITHOUT CARDIOVERSION N/A 03/15/2015   Procedure: TRANSESOPHAGEAL ECHOCARDIOGRAM (TEE);  Surgeon: Thayer Headings, MD;  Location: [place];  Service: Cardiovascular;  Laterality: N/A;   TONSILLECTOMY AND ADENOIDECTOMY     TOTAL KNEE ARTHROPLASTY Right 06/15/2014   Procedure: TOTAL KNEE ARTHROPLASTY;   Surgeon: Renette Butters, MD;  Location: [place];  Service: Orthopedics;  Laterality: Right;   TOTAL KNEE ARTHROPLASTY Left 10/17/2021   Procedure: Left TOTAL KNEE ARTHROPLASTY;  Surgeon: Mcarthur Rossetti, MD;  Location: [place];  Service: Orthopedics;  Laterality: Left;    There were no vitals filed for this visit.   Subjective Assessment - 11/21/21 1525     Subjective Pt. states that the L knee is feeling sore today. He also states that the back is feeling sore from supine exercises completed at last visit. Pt. rates pain of L knee a 7/10 today.    Pertinent History OA, syncope, glaucoma, DM2    Limitations Lifting;Standing;Walking;House hold activities    Patient Stated Goals walking  in community, play drum in church    Pain Onset 1 to 4 weeks ago                               [facility] Adult PT Treatment/Exercise - 11/21/21 0001       Ambulation/Gait   Ambulation/Gait Yes    Ambulation/Gait Assistance 5: Supervision    Ambulation/Gait Assistance Details w/ Gait belt    Ambulation Distance (Feet) 150 Feet    Assistive device None    Gait Pattern Within Functional Limits    Ambulation Surface Level;Indoor    Ramp 5: Supervision    Curb 5: Supervision    Gait Comments Forward, Side stepping, and Backward ambulation with no difficulty      Knee/Hip Exercises: Stretches   Gastroc Stretch 3 reps;30 seconds;Both    Gastroc Stretch Limitations Slant board, 2 UE support at treadmill      Knee/Hip Exercises: Aerobic   Recumbent Bike Seat 9 lvl 5 8 minutes      Knee/Hip Exercises: Machines for Strengthening   Total Gym Leg Press DL 100#, SL lft. 75#      Knee/Hip Exercises: Standing   Forward Step Up 10 reps;2 sets;Hand Hold: 2;Step Height: 8"    Forward Step Up Limitations Stepping up w/ L down w/ R, in parallel bars    Other Standing Knee Exercises 15 reps;1 set;Step Height: 4";Hand Hold: 1 Lft. UE;   Attempted the 6in step and was unable to perform exercise  due to pain and weakness   Other Standing Knee Exercises --      Knee/Hip Exercises: Seated   Other Seated Knee/Hip Exercises Tailgaters flexion stretch 5 x 10 sec. hold                       PT Short Term Goals - 11/02/21 1355       PT SHORT TERM GOAL #1   Title patient demo & verbalizes understanding & compliance with HEP.    Time 4    Period Weeks    Status New    Target Date 12/01/21      PT SHORT TERM GOAL #2   Title left knee PROM ext 0* to 100* flex    Time 4    Period Weeks    Status New    Target Date 12/01/21      PT SHORT TERM GOAL #3   Title Patient reports left knee pain </= 5/10    Time 4    Period Weeks    Status New    Target Date 12/01/21               PT Long Term Goals - 11/02/21 1352       PT LONG TERM GOAL #1   Title FOTO score >/= 60%    Time 8    Period Weeks    Status New    Target Date 12/29/21      PT LONG TERM GOAL #2   Title left knee pain </= 2/10 with standing & gait activities    Time 8    Period Weeks    Status New    Target Date 12/29/21      PT LONG TERM GOAL #3   Title Left knee AROM 0* ext to 105* flexion    Time 8    Period Weeks    Status New  Target Date 12/29/21      PT LONG TERM GOAL #4   Title Patient ambulates with 2 canes modified independent for community mobility including ramps, curbs & stairs.    Time 8    Period Weeks    Target Date 12/29/21                   Plan - 11/21/21 1613     Clinical Impression Statement Session focused on progressing ROM, and strengthening of the L knee. Pt. is progressing well, but still shows limitations with step navigation using one UE. This deficit will be addressed with continued PT. Pt. was able to ambulate efficiently in all directions with no assistive device, and minimum supervision. PT informed pt. it is appropriate to not use the [facility] while in his home and to continue using [facility] when ambulating in the community as needed.    Personal  Factors and Comorbidities Comorbidity 3+;Fitness    Comorbidities HTN, Bundle branch block, pacemaker, anxiety, MI, cardiomyopathy, COPD, DJD, OA, syncope, glaucoma, DM2    Examination-Activity Limitations Carry;Locomotion Level;Sleep;Squat;Stairs;Stand;Transfers    Examination-Participation Restrictions Church;Community Activity;Occupation    Stability/Clinical Decision Making Evolving/Moderate complexity    Rehab Potential Good    PT Frequency 2x / week    PT Duration 8 weeks    PT Treatment/Interventions ADLs/Self Care Home Management;Cryotherapy;Electrical Stimulation;Moist Heat;Gait training;Stair training;Functional mobility training;Therapeutic activities;Therapeutic exercise;Balance training;Neuromuscular re-education;DME Instruction;Patient/family education;Manual techniques;Scar mobilization;Passive range of motion;Vasopneumatic Device;Joint Manipulations    PT Next Visit Plan Progress ROM and strengthening L knee exercises    PT Home Exercise Plan Access Code: [email or web address]    Consulted and Agree with Plan of Care Patient             Patient will benefit from skilled therapeutic intervention in order to improve the following deficits and impairments:  Abnormal gait, Decreased activity tolerance, Decreased balance, Decreased endurance, Decreased knowledge of use of DME, Decreased mobility, Decreased range of motion, Decreased skin integrity, Decreased scar mobility, Decreased strength, Difficulty walking, Increased edema, Impaired flexibility, Postural dysfunction, Obesity, Pain  Visit Diagnosis: Acute pain of left knee  Stiffness of left knee, not elsewhere classified  Muscle weakness (generalized)  Localized edema  Other abnormalities of gait and mobility  Unsteadiness on feet     Problem List Patient Active Problem List   Diagnosis Date Noted   Unilateral primary osteoarthritis, left knee 10/17/2021   Status post total knee replacement, left 10/17/2021   Rib pain on  right side 10/10/2021   History of colonic polyps 09/26/2021   Acute cough 07/21/2021   Bursitis of left hip 06/13/2021   Tooth fracture 06/11/2021   Pain and swelling of right lower leg 05/18/2021   Hemorrhagic shock ([place]) 05/05/2021   Gastrointestinal hemorrhage    Diverticulitis of colon    Preop exam for internal medicine 03/04/2021   B12 deficiency 03/04/2021   Vitamin D deficiency 03/01/2021   Myalgia 12/01/2020   Laceration of left leg 09/17/2020   Statin myopathy 08/02/2020   Rotator cuff arthropathy of both shoulders 02/23/2020   (HFpEF) heart failure with preserved ejection fraction ([place]) 02/22/2020   ICD (implantable cardioverter-defibrillator) in place 12/27/2019   COVID-19 virus infection 12/09/2019   COVID-19 12/09/2019   Hypotension 12/09/2019   Upper respiratory infection 11/27/2019   COPD exacerbation ([place]) 11/27/2019   Elevated PSA 09/23/2019   Lip lesion 12/23/2018   Diabetic ulcer of left lower leg with necrosis of muscle ([place]) 12/05/2018   Chronic bursitis of left shoulder  07/01/2018   Pain due to total knee replacement (HCC) 03/31/2018   Rotator cuff arthropathy, left 02/13/2018   Right rotator cuff tear arthropathy 01/14/2018   Increased prostate specific antigen (PSA) velocity 09/26/2017   Trigger point of thoracic region 07/04/2017   Right lumbar radiculopathy 05/15/2017   Right leg swelling 05/09/2017   Glaucoma 04/18/2017   History of nasal polyposis 02/19/2017   Chronic rhinitis 02/19/2017   Microhematuria 12/04/2016   Hyperlipidemia 08/15/2016   Encounter for well adult exam with abnormal findings 11/29/2015   Persistent atrial fibrillation ([place]) 03/30/2015   Syncope 03/30/2015   Acute on chronic diastolic heart failure ([place]) 03/30/2015   Former tobacco use 02/25/2015   Type 2 diabetes mellitus ([place]) 02/25/2015   Atrial fibrillation ([place]) 02/25/2015   Anemia 02/23/2015   Thyrotoxicosis 02/23/2015   Hx of adenomatous colonic polyps  10/21/2014   De Quervain's tenosynovitis, right 04/30/2014   Osteoarthritis of right knee 02/19/2014   Encounter for therapeutic drug monitoring 11/17/2013   Thoracic degenerative disc disease 06/17/2013   Thoracic spondylosis without myelopathy 02/11/2013   Lumbosacral spondylosis without myelopathy 02/11/2013   Degenerative joint disease of knee, left 02/11/2013   COPD (chronic obstructive pulmonary disease) ([place]) 08/20/2012   Bundle branch block, right and extreme left anterior fascicular 05/05/2012   Nonischemic cardiomyopathy ([place]) 05/02/2012   Other primary cardiomyopathies 05/02/2012   Anticoagulated on warfarin 03/21/2012   Spongiotic dermatitis 09/26/2011   Past myocardial infarction 04/19/2011   UNSPECIFIED URETHRAL STRICTURE 12/26/2010   Chronic pain syndrome 05/18/2010   Obstructive sleep apnea 10/21/2007   NEPHROLITHIASIS, HX OF 10/21/2007   Anxiety 06/09/2007   GERD (gastroesophageal reflux disease) 06/09/2007   Benign prostatic hyperplasia 06/09/2007   Morbid obesity ([place]) 06/03/2007   Depression 06/03/2007   Essential hypertension 06/03/2007   INSOMNIA 24/81/8590    Huda Petrey Singer, Student-PT 11/21/2021, [facility] [address] [place], [place], [address] Phone: [phone]   Fax:  [phone]  Name: [name] MRN: [id number] Date of Birth: [birth year]

## 2021-11-22 NOTE — Progress Notes (Signed)
EPIC Encounter for ICM Monitoring  Patient Name: [name] is a 66 y.o. male Date: 11/22/2021 Primary Care Physican: [name], MD Primary Cardiologist: [name] Electrophysiologist: Vergie Living Pacing: 99.9%    11/22/2021 Weight: 244.5 lbs     Spoke with patient and heart failure questions reviewed.  Pt asymptomatic for fluid accumulation.  Pt did not taken Torsemide for 2 weeks following 1/3 TKR.   Optivol thoracic impedance suggesting possible fluid accumulation from 1/6-1/23 and 2/1-2/5 and returned to baseline on transmission date 2/6.   Prescribed:  Torsemide 20 mg to 4 tablets (80 mg total) every morning and 3 tablets (60 mg tota) every afternoon.   Potassium 20 mEq take 2 tablets (40 mEq total) daily   Labs: 10/18/2021 Creatinine 1.27, BUN 23, Potassium 5.0, Sodium 136, GFR >60 09/25/2022 Creatinine 1.13, BUN 20, Potassium 4.8, Sodium 138, GFR >60  09/15/2022 Creatinine 1.39, BUN 31, Potassium 4.3, Sodium 136, GFR 56  06/28/2021 Creatinine 1.14, BUN 22, Potassium 3.7, Sodium 137, GFR >60  A complete set of results can be found in Results Review.   Recommendations:   No changes and encouraged to call if experiencing any fluid symptoms.   Follow-up plan: ICM clinic phone appointment on 12/25/2021.   91 day device clinic remote transmission 01/24/2022.   EP/Cardiology Office Visits:   12/15/2021 with [facility].  Advised to call office to schedule overdue appt with Dr [name].  Recall 02/22/2021 with Dr [name].    Copy of ICM check sent to Dr. [name].    3 month ICM trend: 11/20/2021.    12-14 Month ICM trend:     [name], RN 11/22/2021 1:54 PM

## 2021-11-23 ENCOUNTER — Other Ambulatory Visit: Payer: Self-pay | Admitting: Internal Medicine

## 2021-11-23 ENCOUNTER — Encounter: Payer: Self-pay | Admitting: Internal Medicine

## 2021-11-23 ENCOUNTER — Other Ambulatory Visit (HOSPITAL_COMMUNITY): Payer: Self-pay | Admitting: Cardiology

## 2021-11-23 ENCOUNTER — Encounter: Payer: Medicare HMO | Admitting: Physical Therapy

## 2021-11-23 DIAGNOSIS — L308 Other specified dermatitis: Secondary | ICD-10-CM

## 2021-11-23 MED ORDER — VALACYCLOVIR HCL 1 G PO TABS: 1000.0000 mg | ORAL_TABLET | Freq: Three times a day (TID) | ORAL | 5 refills | Status: AC

## 2021-11-24 ENCOUNTER — Telehealth: Payer: Self-pay | Admitting: *Deleted

## 2021-11-24 ENCOUNTER — Other Ambulatory Visit: Payer: Self-pay | Admitting: Orthopaedic Surgery

## 2021-11-24 ENCOUNTER — Other Ambulatory Visit: Payer: Self-pay | Admitting: *Deleted

## 2021-11-24 MED ORDER — OXYCODONE HCL 5 MG PO TABS: 5.0000 mg | ORAL_TABLET | Freq: Four times a day (QID) | ORAL | 0 refills | Status: DC | PRN

## 2021-11-24 NOTE — Telephone Encounter (Signed)
Patient called requesting refill of pain medication. Thanks.

## 2021-11-25 ENCOUNTER — Other Ambulatory Visit: Payer: Self-pay | Admitting: Internal Medicine

## 2021-11-27 ENCOUNTER — Encounter: Payer: Medicare HMO | Admitting: Physical Therapy

## 2021-11-28 ENCOUNTER — Other Ambulatory Visit: Payer: Self-pay

## 2021-11-28 ENCOUNTER — Ambulatory Visit (INDEPENDENT_AMBULATORY_CARE_PROVIDER_SITE_OTHER): Payer: Medicare HMO | Admitting: Podiatry

## 2021-11-28 DIAGNOSIS — M79674 Pain in right toe(s): Secondary | ICD-10-CM | POA: Diagnosis not present

## 2021-11-28 DIAGNOSIS — B351 Tinea unguium: Secondary | ICD-10-CM

## 2021-11-28 DIAGNOSIS — M79675 Pain in left toe(s): Secondary | ICD-10-CM | POA: Diagnosis not present

## 2021-11-28 DIAGNOSIS — E119 Type 2 diabetes mellitus without complications: Secondary | ICD-10-CM

## 2021-11-29 ENCOUNTER — Encounter: Payer: Self-pay | Admitting: Orthopaedic Surgery

## 2021-11-29 ENCOUNTER — Ambulatory Visit (INDEPENDENT_AMBULATORY_CARE_PROVIDER_SITE_OTHER): Payer: Medicare HMO | Admitting: Orthopaedic Surgery

## 2021-11-29 DIAGNOSIS — Z96652 Presence of left artificial knee joint: Secondary | ICD-10-CM

## 2021-11-29 NOTE — Progress Notes (Signed)
The patient is now just over 6 weeks status post a left total knee arthroplasty.  He says he has increased motion and strength of the left knee and is very pleased overall that he is doing well.  He feels like he can transition now away from outpatient therapy and just get back to the [facility].  He does ambulate a cane.  He has a history of a remote right knee replacement.  He is to be significantly morbidly obese and he is lost a lot of weight.  Examination of his more recent left operative knee shows he has good extension and flexion.  The knee feels ligamentously stable to me.  His incision is healed.  There is only slight warmth and swelling.  This point since he is doing so well I do not really need to see him back for 6 months unless there are issues.  At that visit we will have a standing AP and lateral of his left operative knee.

## 2021-11-30 ENCOUNTER — Encounter: Payer: Medicare HMO | Admitting: Physical Therapy

## 2021-12-01 ENCOUNTER — Other Ambulatory Visit: Payer: Self-pay | Admitting: Internal Medicine

## 2021-12-01 ENCOUNTER — Other Ambulatory Visit (HOSPITAL_COMMUNITY): Payer: Self-pay | Admitting: Family Medicine

## 2021-12-03 NOTE — Progress Notes (Signed)
°  Subjective:  Patient ID: [name], male    DOB: 06-03-1956,  MRN: [id number]  Chief Complaint  Patient presents with   Nail Problem     for risk diabetic nail care, 9 week follow up     66 y.o. male returns with the above complaint. History confirmed with patient.  He had a successful knee replacement.  He would like to plan for his bunion correction now in the next few months.  Nails are thickened elongated and causing pain again.  Objective:  Physical Exam: warm, good capillary refill, no trophic changes or ulcerative lesions, normal sensory exam, +2 DP and PT reduced bilateral and venous stasis dermatitis noted.  Moderate to severe hallux abductovalgus with hammertoe contractures 2 through 5 bilaterally, right worse than left.  Summary:  Right: Resting right ankle-brachial index indicates noncompressible right  lower extremity arteries. The right toe-brachial index is normal. ABIs are  unreliable.   Left: Resting left ankle-brachial index is within normal range. No  evidence of significant left lower extremity arterial disease. The left  toe-brachial index is normal.       *See table(s) above for measurements and observations.         Radiographs: X-ray of the right foot: Moderate hallux abductovalgus with the presence of metatarsus adductus deformity and pes planus Assessment:   1. Pain due to onychomycosis of toenails of both feet   2. Type 2 diabetes mellitus without complication, without long-term current use of insulin ([place])       Plan:  Patient was evaluated and treated and all questions answered.  Patient educated on diabetes. Discussed proper diabetic foot care and discussed risks and complications of disease. Educated patient in depth on reasons to return to the office immediately should he/she discover anything concerning or new on the feet. All questions answered. Discussed proper shoes as well.  Discussed the etiology and treatment options for the  condition in detail with the patient. Educated patient on the topical and oral treatment options for mycotic nails. Recommended debridement of the nails today. Sharp and mechanical debridement performed of all painful and mycotic nails today. Nails debrided in length and thickness using a nail nipper to level of comfort. Discussed treatment options including appropriate shoe gear. Follow up as needed for painful nails.  Ingrowing corners of hallux nails debrided and slant back fashion  Plan to return in 1 month for bunion surgery planning visit  Return in about 1 month (around 12/26/2021) for bunion surgery planning visit .

## 2021-12-06 ENCOUNTER — Encounter: Payer: Self-pay | Admitting: Internal Medicine

## 2021-12-12 DIAGNOSIS — I255 Ischemic cardiomyopathy: Secondary | ICD-10-CM

## 2021-12-12 DIAGNOSIS — I4819 Other persistent atrial fibrillation: Secondary | ICD-10-CM | POA: Diagnosis not present

## 2021-12-14 NOTE — Progress Notes (Addendum)
[place] Phone: [phone] Subjective:   [name], am serving as a scribe for Dr. Hulan Saas.  This visit occurred during the SARS-CoV-2 public health emergency.  Safety protocols were in place, including screening questions prior to the visit, additional usage of staff PPE, and extensive cleaning of exam room while observing appropriate contact time as indicated for disinfecting solutions.  I'm seeing this patient by the request  of:  [name], MD  CC: bilateral shoulder pain   [id number]  10/24/2021 Bilateral injections given today.  Tolerated the procedure well, patient does not need to be able to use his arms under direct with patient having the knee replacement recently.  Do not want him to take the prednisone with him being on the blood thinner as well as recent knee replacement.  At this moment patient will increase activity as tolerated.  Patient will follow up with me again in 6 to 8 weeks   [name] is a 67 y.o. male coming in with complaint of bilateral shoulder pain. Patient states that he is having L glute pain for 2 weeks. Feels like he twisted hip due to using cane.   Would like L shoulder injection and L hip injection today.        Past Medical History:  Diagnosis Date   AICD (automatic cardioverter/defibrillator) present    Anemia    supposed to be taking Vit B but doesn't   ANXIETY    takes Xanax nightly   Arthritis    Asthma    Albuterol prn and Advair daily;also takes Prednisone daily   Atrial fibrillation ([place]) 09/06/2015   Cardiomyopathy ([place])    a. EF 25% TEE July 2013; b. EF normalized 2015;  c. 03/2015 Echo: EF 40-45%, difrf HK, PASP 38 mmHg, Mild MR, sev LAE/RAE.   CHF (congestive heart failure) (HCC)    Chronic constipation    takes OTC stool softener   COPD (chronic obstructive pulmonary disease) (HCC)    "one dr says COPD; one dr says emphysema"  (09/18/2017)   DEPRESSION    takes Zoloft and Doxepin daily   Diverticulitis    DYSKINESIA, ESOPHAGUS    Dysrhythmia    Atrial Fibrilation   Emphysema of lung [facility])    "one dr says COPD; one dr says emphysema" (09/18/2017)   Essential hypertension        FIBROMYALGIA    GERD (gastroesophageal reflux disease)        Glaucoma    HYPERLIPIDEMIA    a. Intolerant to statins.   INSOMNIA    takes Ambien nightly   Myocardial infarction [facility])    a. 2012 Myoview notable for prior infarct;  b. 03/2015 Lexiscan CL: EF 37%, diff HK, small area of inferior infarct from apex to base-->Med Rx.   Myocardial infarction ([place])    O2 dependent    "2.5L q hs & prn" (09/18/2017)   Paroxysmal atrial fibrillation ([place])    a. CHA2DS2VASc = 3--> takes Coumadin;  b. 03/15/2015 Successful TEE/DCCV;  c. 03/2015 recurrent afib, Amio d/c'd in setting of hyperthyroidism.   Peripheral neuropathy    Pneumonia 12/2016   Rash and other nonspecific skin eruption 04/12/2009   no cause found saw dermatologists x 2 and allergist   SLEEP APNEA, OBSTRUCTIVE    a. doesn't use CPAP   Syncope    a. 03/2015 s/p MDT LINQ.   Type II diabetes mellitus ([place])  Past Surgical History:  Procedure Laterality Date   ACNE CYST REMOVAL     2 on back    AV NODE ABLATION N/A 10/25/2017   Procedure: AV NODE ABLATION;  Surgeon: Deboraha Sprang, MD;  Location: [place] CV LAB;  Service: Cardiovascular;  Laterality: N/A;   BIV ICD INSERTION CRT-D N/A 09/18/2017   Procedure: BIV ICD INSERTION CRT-D;  Surgeon: Deboraha Sprang, MD;  Location: [place] CV LAB;  Service: Cardiovascular;  Laterality: N/A;   CARDIAC CATHETERIZATION N/A 03/21/2016   Procedure: Right/Left Heart Cath and Coronary Angiography;  Surgeon: Larey Dresser, MD;  Location: [place] CV LAB;  Service: Cardiovascular;  Laterality: N/A;   CARDIOVERSION  04/18/2012   Procedure: CARDIOVERSION;  Surgeon: Fay Records, MD;  Location: [place];  Service: Cardiovascular;   Laterality: N/A;   CARDIOVERSION  04/25/2012   Procedure: CARDIOVERSION;  Surgeon: Thayer Headings, MD;  Location: [place];  Service: Cardiovascular;  Laterality: N/A;   CARDIOVERSION  04/25/2012   Procedure: CARDIOVERSION;  Surgeon: Fay Records, MD;  Location: [place];  Service: Cardiovascular;  Laterality: N/A;   CARDIOVERSION  05/09/2012   Procedure: CARDIOVERSION;  Surgeon: Sherren Mocha, MD;  Location: [place];  Service: Cardiovascular;  Laterality: N/A;  changed from crenshaw to cooper by trish/leone-endo   CARDIOVERSION N/A 03/15/2015   Procedure: CARDIOVERSION;  Surgeon: Thayer Headings, MD;  Location: [place];  Service: Cardiovascular;  Laterality: N/A;   COLONOSCOPY     COLONOSCOPY WITH PROPOFOL N/A 10/21/2014   Procedure: COLONOSCOPY WITH PROPOFOL;  Surgeon: Ladene Artist, MD;  Location: [facility];  Service: Endoscopy;  Laterality: N/A;   EP IMPLANTABLE DEVICE N/A 04/06/2015   Procedure: Loop Recorder Insertion;  Surgeon: Evans Lance, MD;  Location: [place] CV [facility];  Service: Cardiovascular;  Laterality: N/A;   ESOPHAGOGASTRODUODENOSCOPY     JOINT REPLACEMENT     LOOP RECORDER REMOVAL N/A 09/18/2017   Procedure: LOOP RECORDER REMOVAL;  Surgeon: Deboraha Sprang, MD;  Location: [place] CV LAB;  Service: Cardiovascular;  Laterality: N/A;   RIGHT/LEFT HEART CATH AND CORONARY ANGIOGRAPHY N/A 01/28/2017   Procedure: Right/Left Heart Cath and Coronary Angiography;  Surgeon: Larey Dresser, MD;  Location: [place] CV LAB;  Service: Cardiovascular;  Laterality: N/A;   TEE WITHOUT CARDIOVERSION  04/25/2012   Procedure: TRANSESOPHAGEAL ECHOCARDIOGRAM (TEE);  Surgeon: Thayer Headings, MD;  Location: [place];  Service: Cardiovascular;  Laterality: N/A;   TEE WITHOUT CARDIOVERSION N/A 03/15/2015   Procedure: TRANSESOPHAGEAL ECHOCARDIOGRAM (TEE);  Surgeon: Thayer Headings, MD;  Location: [place];  Service: Cardiovascular;  Laterality: N/A;   TONSILLECTOMY AND  ADENOIDECTOMY     TOTAL KNEE ARTHROPLASTY Right 06/15/2014   Procedure: TOTAL KNEE ARTHROPLASTY;  Surgeon: Renette Butters, MD;  Location: [place];  Service: Orthopedics;  Laterality: Right;   TOTAL KNEE ARTHROPLASTY Left 10/17/2021   Procedure: Left TOTAL KNEE ARTHROPLASTY;  Surgeon: Mcarthur Rossetti, MD;  Location: [place];  Service: Orthopedics;  Laterality: Left;   Social History   Socioeconomic History   Marital status: Divorced    Spouse name: Not on file   Number of children: 2   Years of education: Not on file   Highest education level: Not on file  Occupational History   Occupation: retired/disabled. prev worked in Therapist, sports.    Employer: DISABLED  Tobacco Use   Smoking status: Former    Packs/day: 2.00    Years: 30.00  Pack years: 60.00    Types: Cigarettes    Quit date: 10/16/2007    Years since quitting: 14.1   Smokeless tobacco: Never  Vaping Use   Vaping Use: Never used  Substance and Sexual Activity   Alcohol use: No   Drug use: No   Sexual activity: Not Currently  Other Topics Concern   Not on file  Social History Narrative   Lives alone.   Social Determinants of Health   Financial Resource Strain: Low Risk    Difficulty of Paying Living Expenses: Not hard at all  Food Insecurity: No Food Insecurity   Worried About Charity fundraiser in the Last Year: Never true   [place] in the Last Year: Never true  Transportation Needs: No Transportation Needs   Lack of Transportation (Medical): No   Lack of Transportation (Non-Medical): No  Physical Activity: Inactive   Days of Exercise per Week: 0 days   Minutes of Exercise per Session: 0 min  Stress: No Stress Concern Present   Feeling of Stress : Not at all  Social Connections: Moderately Integrated   Frequency of Communication with Friends and Family: More than three times a week   Frequency of Social Gatherings with Friends and Family: More than three times a week   Attends Religious  Services: More than 4 times per year   Active Member of [employer] or Organizations: Yes   Attends Music therapist: More than 4 times per year   Marital Status: Never married   Allergies  Allergen Reactions   Amiodarone Other (See Comments)    hyperthyroidism   Statins Other (See Comments)    myalgia   Tape Other (See Comments)    Skin Tears Use Paper Tape Only   Family History  Problem Relation Age of Onset   COPD Mother    Asthma Mother    Colon polyps Mother    Allergies Mother    Hypothyroidism Mother    Asthma Maternal Grandmother    Colon cancer Neg Hx     Current Outpatient Medications (Endocrine & Metabolic):    dapagliflozin propanediol (FARXIGA) 10 MG TABS tablet, Take 1 tablet (10 mg total) by mouth daily before breakfast.  Current Outpatient Medications (Cardiovascular):    carvedilol (COREG) 6.25 MG tablet, Take 1 tablet (6.25 mg total) by mouth 2 (two) times daily.   eplerenone (INSPRA) 50 MG tablet, Take 1 tablet (50 mg total) by mouth daily.   Evolocumab (REPATHA SURECLICK) 149 MG/ML SOAJ, INJECT 1 PEN INTO THE SKIN EVERY 14 (FOURTEEN) DAYS.   losartan (COZAAR) 25 MG tablet, TAKE 1/2 TABLET BY MOUTH EVERY DAY   sildenafil (VIAGRA) 50 MG tablet, Take 1 tablet (50 mg total) by mouth as needed for erectile dysfunction.   torsemide (DEMADEX) 20 MG tablet, TAKE 3 TABLETS (60 MG TOTAL) BY MOUTH 2 (TWO) TIMES DAILY.   metolazone (ZAROXOLYN) 2.5 MG tablet, Take 1 tablet (2.5 mg total) by mouth as directed. With additional 40 meq of potassium (Patient taking differently: Take 2.5 mg by mouth daily. With additional 40 meq of potassium)  Current Outpatient Medications (Respiratory):    albuterol (VENTOLIN HFA) 108 (90 Base) MCG/ACT inhaler, INHALE 2 PUFFS BY MOUTH 4 TIMES A DAY AS NEEDED   fluticasone (FLONASE) 50 MCG/ACT nasal spray, PLACE 2 SPRAYS INTO BOTH NOSTRILS DAILY AS NEEDED FOR ALLERGIES OR RHINITIS.  Current Outpatient Medications (Analgesics):     acetaminophen (TYLENOL) 500 MG tablet, Take 500 mg by mouth  every 6 (six) hours as needed for moderate pain.  Current Outpatient Medications (Hematological):    apixaban (ELIQUIS) 5 MG TABS tablet, Take 1 tablet (5 mg total) by mouth 2 (two) times daily.  Current Outpatient Medications (Other):    ALPRAZolam (XANAX) 0.5 MG tablet, TAKE 2 TABLETS BY MOUTH AT BEDTIME   AMBULATORY NON FORMULARY MEDICATION, Rolling walker with a bench   augmented betamethasone dipropionate (DIPROLENE-AF) 0.05 % cream, Apply 1 application topically daily as needed (itching).   carisoprodol (SOMA) 350 MG tablet, TAKE 1 TABLET BY MOUTH THREE TIMES A DAY AS NEEDED FOR MUSCLE SPASMS   Cholecalciferol 100 MCG (4000 UT) CAPS, 1 tab by mouth once daily   doxepin (SINEQUAN) 10 MG capsule, TAKE 2 CAPSULES (20 MG TOTAL) BY MOUTH AT BEDTIME AS NEEDED.   Dupilumab (DUPIXENT) 300 MG/2ML SOPN, Inject 300 mg into the skin every 14 (fourteen) days.   fluticasone (CUTIVATE) 0.05 % cream, SMARTSIG:Topical 1-2 Times Daily PRN   hydrOXYzine (ATARAX/VISTARIL) 10 MG tablet, TAKE 1 TABLET BY MOUTH 3 TIMES DAILY AS NEEDED FOR ITCHING.   ketoconazole (NIZORAL) 2 % cream, APPLY 1 APPLICATION TOPICALLY DAILY   lidocaine (LIDODERM) 5 %, APPLY 1 PATCH TO AFFECTED AREA FOR UP TO 12 HOURS . DO NOT USE MORE THAN ONE PATCH IN 24 HOURS.   omeprazole (PRILOSEC) 20 MG capsule, TAKE 1 CAPSULE BY MOUTH TWICE A DAY   Potassium Chloride (KLOR-CON PO), Take 20 mcg by mouth daily.   tamsulosin (FLOMAX) 0.4 MG CAPS capsule, Take 0.4 mg by mouth daily.   traZODone (DESYREL) 100 MG tablet, 2 tab by mouth at bedtime as needed   triamcinolone (KENALOG) 0.1 % paste, USE AS DIRECTED IN THE MOUTH OR THROAT 2 (TWO) TIMES DAILY.   TUMS 500 MG chewable tablet, Chew 500-2,000 mg by mouth every 4 (four) hours as needed for indigestion or heartburn.    venlafaxine XR (EFFEXOR-XR) 75 MG 24 hr capsule, Take 1 capsule (75 mg total) by mouth daily with breakfast.    zolpidem (AMBIEN) 10 MG tablet, TAKE 1 TABLET BY MOUTH EVERY DAY AT BEDTIME AS NEEDED   Reviewed prior external information including notes and imaging from  primary care provider As well as notes that were available from care everywhere and other healthcare systems.  Past medical history, social, surgical and family history all reviewed in electronic medical record.  No pertanent information unless stated regarding to the chief complaint.   Review of Systems:  No headache, visual changes, nausea, vomiting, diarrhea, constipation, dizziness, abdominal pain, skin rash, fevers, chills, night sweats, weight loss, swollen lymph nodes, body aches, joint swelling, chest pain, shortness of breath, mood changes. POSITIVE muscle aches  Objective  Blood pressure 102/70, pulse 72, height 5\' 10"  (1.778 m), weight 256 lb (116.1 kg), SpO2 97 %.   General: No apparent distress alert and oriented x3 mood and affect normal, dressed appropriately.  HEENT: Pupils equal, extraocular movements intact  Respiratory: Patient's speak in full sentences and does not appear short of breath  Cardiovascular: No lower extremity edema, non tender, no erythema  Gait antalgic walking with the aid of a cane. MSK: Bilateral shoulders do have some mild atrophy noted.  Does have some crepitus noted at this time as well.  He still has 3+ out of 5 strength of the shoulders bilaterally. Tenderness to palpation over the left gluteal area as well.  Difficulty with FABER test secondary to pain.  Procedure: Real-time Ultrasound Guided Injection of right glenohumeral joint Device:  GE Logiq Q7  Ultrasound guided injection is preferred based studies that show increased duration, increased effect, greater accuracy, decreased procedural pain, increased response rate with ultrasound guided versus blind injection.  Verbal informed consent obtained.  Time-out conducted.  Noted no overlying erythema, induration, or other signs of local  infection.  Skin prepped in a sterile fashion.  Local anesthesia: Topical Ethyl chloride.  With sterile technique and under real time ultrasound guidance:  Joint visualized.  23g 1  inch needle inserted posterior approach. Pictures taken for needle placement. Patient did have injection of 2 cc of 1% lidocaine, 2 cc of 0.5% Marcaine, and 1.0 cc of Kenalog 40 mg/dL. Completed without difficulty  Pain immediately resolved suggesting accurate placement of the medication.  Advised to call if fevers/chills, erythema, induration, drainage, or persistent bleeding.  Impression: Technically successful ultrasound guided injection.  Procedure: Real-time Ultrasound Guided Injection of left glenohumeral joint Device: GE Logiq E  Ultrasound guided injection is preferred based studies that show increased duration, increased effect, greater accuracy, decreased procedural pain, increased response rate with ultrasound guided versus blind injection.  Verbal informed consent obtained.  Time-out conducted.  Noted no overlying erythema, induration, or other signs of local infection.  Skin prepped in a sterile fashion.  Local anesthesia: Topical Ethyl chloride.  With sterile technique and under real time ultrasound guidance:  Joint visualized.  21g 2 inch needle inserted posterior approach. Pictures taken for needle placement. Patient did have injection of 2 cc of 0.5% Marcaine, and 1cc of Kenalog 40 mg/dL. Completed without difficulty  Pain immediately resolved suggesting accurate placement of the medication.  Advised to call if fevers/chills, erythema, induration, drainage, or persistent bleeding.  Impression: Technically successful ultrasound guided injection.  Procedure: Real-time Ultrasound Guided Injection of left gluteal tendon sheath Device: GE Logiq Q7 Ultrasound guided injection is preferred based studies that show increased duration, increased effect, greater accuracy, decreased procedural pain,  increased response rate, and decreased cost with ultrasound guided versus blind injection.  Verbal informed consent obtained.  Time-out conducted.  Noted no overlying erythema, induration, or other signs of local infection.  Skin prepped in a sterile fashion.  Local anesthesia: Topical Ethyl chloride.  With sterile technique and under real time ultrasound guidance: With a 25-gauge half inch needle injected with 0.5 cc of 0.5 ml of marcaine and 0.5 cc of 40mg /dL of kenalog.  Completed without difficulty  Pain immediately resolved suggesting accurate placement of the medication.  Advised to call if fevers/chills, erythema, induration, drainage, or persistent bleeding.  Impression: Technically successful ultrasound guided injection.   Impression and Recommendations:     The above documentation has been reviewed and is accurate and complete Zachary Pulley, DO

## 2021-12-15 ENCOUNTER — Other Ambulatory Visit: Payer: Self-pay

## 2021-12-15 ENCOUNTER — Encounter (HOSPITAL_COMMUNITY): Payer: Self-pay

## 2021-12-15 ENCOUNTER — Ambulatory Visit (HOSPITAL_COMMUNITY)
Admission: RE | Admit: 2021-12-15 | Discharge: 2021-12-15 | Disposition: A | Discharge status: Home / Self Care | Payer: Medicare HMO | Source: Ambulatory Visit | Attending: Family Medicine | Admitting: Family Medicine

## 2021-12-15 VITALS — BP 120/78 | HR 79 | Wt 245.2 lb

## 2021-12-15 DIAGNOSIS — G4733 Obstructive sleep apnea (adult) (pediatric): Secondary | ICD-10-CM | POA: Diagnosis not present

## 2021-12-15 DIAGNOSIS — I4821 Permanent atrial fibrillation: Secondary | ICD-10-CM | POA: Diagnosis not present

## 2021-12-15 DIAGNOSIS — Z87891 Personal history of nicotine dependence: Secondary | ICD-10-CM | POA: Insufficient documentation

## 2021-12-15 DIAGNOSIS — J449 Chronic obstructive pulmonary disease, unspecified: Secondary | ICD-10-CM

## 2021-12-15 DIAGNOSIS — I110 Hypertensive heart disease with heart failure: Secondary | ICD-10-CM | POA: Diagnosis not present

## 2021-12-15 DIAGNOSIS — R42 Dizziness and giddiness: Secondary | ICD-10-CM | POA: Diagnosis not present

## 2021-12-15 DIAGNOSIS — I5032 Chronic diastolic (congestive) heart failure: Secondary | ICD-10-CM

## 2021-12-15 DIAGNOSIS — Z7984 Long term (current) use of oral hypoglycemic drugs: Secondary | ICD-10-CM | POA: Insufficient documentation

## 2021-12-15 DIAGNOSIS — N529 Male erectile dysfunction, unspecified: Secondary | ICD-10-CM | POA: Diagnosis not present

## 2021-12-15 DIAGNOSIS — Z8679 Personal history of other diseases of the circulatory system: Secondary | ICD-10-CM | POA: Insufficient documentation

## 2021-12-15 DIAGNOSIS — Z79899 Other long term (current) drug therapy: Secondary | ICD-10-CM | POA: Insufficient documentation

## 2021-12-15 DIAGNOSIS — I442 Atrioventricular block, complete: Secondary | ICD-10-CM

## 2021-12-15 DIAGNOSIS — M791 Myalgia, unspecified site: Secondary | ICD-10-CM | POA: Diagnosis not present

## 2021-12-15 DIAGNOSIS — Z7901 Long term (current) use of anticoagulants: Secondary | ICD-10-CM | POA: Diagnosis not present

## 2021-12-15 DIAGNOSIS — I2510 Atherosclerotic heart disease of native coronary artery without angina pectoris: Secondary | ICD-10-CM | POA: Diagnosis not present

## 2021-12-15 DIAGNOSIS — I5022 Chronic systolic (congestive) heart failure: Secondary | ICD-10-CM | POA: Diagnosis not present

## 2021-12-15 DIAGNOSIS — Z8616 Personal history of COVID-19: Secondary | ICD-10-CM | POA: Insufficient documentation

## 2021-12-15 DIAGNOSIS — I428 Other cardiomyopathies: Secondary | ICD-10-CM | POA: Insufficient documentation

## 2021-12-15 DIAGNOSIS — Z09 Encounter for follow-up examination after completed treatment for conditions other than malignant neoplasm: Secondary | ICD-10-CM | POA: Diagnosis not present

## 2021-12-15 DIAGNOSIS — R7989 Other specified abnormal findings of blood chemistry: Secondary | ICD-10-CM

## 2021-12-15 LAB — BASIC METABOLIC PANEL
Anion gap: 7 (ref 5–15)
BUN: 23 mg/dL (ref 8–23)
CO2: 23 mmol/L (ref 22–32)
Calcium: 8.9 mg/dL (ref 8.9–10.3)
Chloride: 107 mmol/L (ref 98–111)
Creatinine, Ser: 1.05 mg/dL (ref 0.61–1.24)
GFR, Estimated: >60 mL/min (ref >60)
Glucose, Bld: 81 mg/dL (ref 70–99)
Potassium: 4.5 mmol/L (ref 3.5–5.1)
Sodium: 137 mmol/L (ref 135–145)

## 2021-12-15 LAB — BRAIN NATRIURETIC PEPTIDE: B Natriuretic Peptide: 72.2 pg/mL (ref 0.0–100.0)

## 2021-12-15 MED ORDER — SILDENAFIL CITRATE 50 MG PO TABS: 50.0000 mg | ORAL_TABLET | ORAL | 0 refills | Status: DC | PRN

## 2021-12-15 NOTE — Progress Notes (Signed)
ID:  [name], DOB 06-May-1956, MRN [id number]  Provider location: [facility] Type of Visit: Established patient  PCP:  [name], MD  Cardiologist:  Dr. [name]   History of Present Illness: [name] is a 66 y.o. male who has history of COPD on home oxygen, permanent atrial fibrillation, and chronic systolic CHF.  Amiodarone and DCCV were tried for atrial fibrillation in the past without success.   Patient has been noted to have a low EF since 2013.  EF improved by to normal by 2015 but dropped to 30-35% by 11/16.  Cath in 6/17 showed some CAD but not enough to cause cardiomyopathy.  He was admitted in 3/18 with PNA, atrial fibrillation/RVR, and acute on chronic systolic CHF.  Echo in 3/18 showed fall in EF to 10-15%.   I took him for right and left heart cath in 4/18, which showed nonobstructive CAD and relatively optimized filling pressures.    Syncope in 11/18. Loop recorder showed 6 second pause and periods of complete heart block. He had Medtronic BiV ICD placed in 12/18.  In 1/19, he had AV nodal ablation to promote BiV pacing.   Echo was done 6/19 with EF 25-30% with moderate RV dilation/mildly decreased systolic function. CPX 7/19 was submaximal, probably mild functional impairment.    Echo in 1/21 showed EF 35-40% with diffuse hypokinesis, moderate RV dilation with mildly decreased systolic function.   He was admitted with COVID-19 PNA in 2/19.  He had AKI/hypotension and Entresto was stopped.   Echo in 3/22 showed EF 40% with diffuse hypokinesis, mild RV enlargement, mildly decreased RV systolic function.   Presented to [facility] 7/22 with weakness and dizziness. Found to be in hemorrhagic shock from acute GIB. Hgb 5.0, INR 8.4. He was on Coumadin for atrial fibrillation. He was resuscitated with PRBCs, IVF, and given Vitamin K. His Entresto, beta blocker and torsemide were held due to hypotension. He initially required ICU monitoring due to  hypotension requiring pressors. GI team was consulted and planned to treat with oral antibiotics for suspected diverticular bleed with plans for outpatient colonoscopy.  Coumadin was transitioned to Eliquis.  He was discharged back on beta blocker, Entresto and SGLT2i. Dig, spiro and torsemide were held at discharge. Weight 259 lbs.  S/p L TKR 1/23.  Today he returns for HF follow up. He had L TKR 1/23 and is doing well walking with one cane now. He has occasional dizziness. No significant dyspnea walking short distances. Denies abnormal bleeding, palpitations, CP, or edema. Chronically sleeps in a recliner.  Appetite ok. No fever or chills. Weight at home 239 pounds. Taking all medications. Plans to get back to walking at the [facility] this week.  ICD interrogation (personally reviewed): No AF, thoracic impedence stable, >99% bi-v pacing, no VT/VF   Labs (4/18): pro-BNP 904, K 3.1, creatinine 1.39 => 1.08 with BUN 66 => 37, hgb 14.1.  Labs (5/18): hemoglobin 13.1 Labs (6/18): K 4, creatinine 1.3, BNP 294 Labs (7/18): K 3.8, creatinine 1.23, LDL 96, HDL 42 Labs (8/18): K 4.5, creatinine 1.22 Labs (10/18): digoxin 0.4 Labs (12/18): LDL 115, HDL 37, TGs 214, K 4.2, creatinine 0.84 Labs (1/19): K 4.4, creatinine 0.75, pro-BNP 433 Labs (2/19): digoxin 0.4 Labs (3/19): LDL 121, hgb 12.8, K 4.5, creatinine 0.98 Labs (6/19): LDL 48, HDL 45, K 5, creatinine 0.94, digoxin 0.3 Labs (9/19): K 3.7, creatinine 1.09 => 1.32, digoxin 0.2 Labs (11/19): LDL 115 Labs (12/20): K 3.9,  creatinine 0.87, LDL 94  Labs (3/21): K 4, creatinine 1.08 Labs (6/21): LDL 109 Labs (7/21): K 3.7, creatinine 1.15 Labs (12/21): LDL 129, TGs 239, hgb 12.9, K 3.9, creatinine 0.98, digoxin 0.8 Labs (1/22): ALT 76, AST 40, LDL 162, K 3.8 =>4.6, creatinine 1.16 => 1.12, digoxin 0.7, repeat LFTs normal Labs (5/22): LDL 72, TGs 335, K 4.2, creatinine 1.29 Labs (7/22): K 4.0, creatinine 0.77 Labs (8/22): K 4.0, creatinine 1.11 Labs  (9/22): K 3.7, creatinine 1.14, hgb 11.7 Labs (1/23): K 5.0, creatinine 1.27, hgb 12  PMH: 1. Asthma 2. COPD: On home oxygen. Prior smoker.  3. Atrial fibrillation: Permanent.  He failed DCCV and amiodarone in the past . 4. HTN 5. GERD 6. Hyperlipidemia: Myalgias with atorvastatin 7. BPH.  8. Chronic systolic CHF: Nonischemic cardiomyopathy.  - EF 25% by TEE in 7/13.  Normalized on 2015 echo. - Echo (11/16): EF 30-35%.  - LHC/RHC (6/17): 80% ostial stenosis small D1, 30-40% pLAD.  Mean RA 4, PA 26/10, mean PCWP 11, CI 2.33.  - Echo (3/18): EF 10-15%, diffuse hypokinesis, severe LV dilation, moderate RV dilation with severely decreased systolic function.  - LHC/RHC (4/18): 40% proximal LAD.  Mean RA 8, PA 37/10 mean 22, mean PCWP 22, LVEDP 9, CI 2.39. - Medtronic BiV ICD placement in 12/18 with AV nodal ablation to promote BiV pacing in 1/19.  - Echo (6/19): EF 25-30% with mild LV dilation, moderately dilated RV with mildly decreased systolic function, IVC not dilated, PASP 23 mmHg.  - CPX (7/19): peak VO2 17.6 (78% predicted), VE/VCO2 slope 31, RER 1.02 => submaximal, probably mild functional impairment.  - Echo (1/21): EF 35-40% with diffuse hypokinesis, moderate RV dilation with mildly decreased systolic function. - Echo (3/22): EF 40% with diffuse hypokinesis, mild RV enlargement, mildly decreased RV systolic function.  9. Morbid obesity 10. ILR in place.  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 Complete heart block: s/p Medtronic CRT-D.  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 OSA: Untreated, unable to tolerate CPAP.  15. COVID-19 PNA 2/21.  16. Hyperlipidemia: Myalgias with multiple statins.  17. PAD: ABIs 10/21 noncompressible on right but TBI normal, normal ABI and TBI on left.  18. GI bleeding: 7/22, diverticulosis.   Social History   Socioeconomic History   Marital status: Divorced    Spouse name: Not on file   Number of children: 2   Years of education: Not on file   Highest  education level: Not on file  Occupational History   Occupation: retired/disabled. prev worked in Therapist, sports.    Employer: DISABLED  Tobacco Use   Smoking status: Former    Packs/day: 2.00    Years: 30.00    Pack years: 60.00    Types: Cigarettes    Quit date: 10/16/2007    Years since quitting: 14.1   Smokeless tobacco: Never  Vaping Use   Vaping Use: Never used  Substance and Sexual Activity   Alcohol use: No   Drug use: No   Sexual activity: Not Currently  Other Topics Concern   Not on file  Social History Narrative   Lives alone.   Social Determinants of Health   Financial Resource Strain: Low Risk    Difficulty of Paying Living Expenses: Not hard at all  Food Insecurity: No Food Insecurity   Worried About Charity fundraiser in the Last Year: Never true   Ran Out of Food in the Last Year: Never true  Transportation Needs: No Transportation Needs   Lack  of Transportation (Medical): No   Lack of Transportation (Non-Medical): No  Physical Activity: Inactive   Days of Exercise per Week: 0 days   Minutes of Exercise per Session: 0 min  Stress: No Stress Concern Present   Feeling of Stress : Not at all  Social Connections: Moderately Integrated   Frequency of Communication with Friends and Family: More than three times a week   Frequency of Social Gatherings with Friends and Family: More than three times a week   Attends Religious Services: More than 4 times per year   Active Member of [employer] or Organizations: Yes   Attends Music therapist: More than 4 times per year   Marital Status: Never married  Human resources officer Violence: Not At Risk   Fear of Current or Ex-Partner: No   Emotionally Abused: No   Physically Abused: No   Sexually Abused: No   Family History  Problem Relation Age of Onset   COPD Mother    Asthma Mother    Colon polyps Mother    Allergies Mother    Hypothyroidism Mother    Asthma Maternal Grandmother    Colon cancer Neg Hx     ROS: All systems reviewed and negative except as per HPI.   Current Outpatient Medications  Medication Sig Dispense Refill   acetaminophen (TYLENOL) 500 MG tablet Take 500 mg by mouth every 6 (six) hours as needed for moderate pain.     albuterol (VENTOLIN HFA) 108 (90 Base) MCG/ACT inhaler INHALE 2 PUFFS BY MOUTH 4 TIMES A DAY AS NEEDED 54 each 3   ALPRAZolam (XANAX) 0.5 MG tablet TAKE 2 TABLETS BY MOUTH AT BEDTIME 60 tablet 2   AMBULATORY NON FORMULARY MEDICATION Rolling walker with a bench 1 Units 0   apixaban (ELIQUIS) 5 MG TABS tablet Take 1 tablet (5 mg total) by mouth 2 (two) times daily. 60 tablet 11   augmented betamethasone dipropionate (DIPROLENE-AF) 0.05 % cream Apply 1 application topically daily as needed (itching).     carisoprodol (SOMA) 350 MG tablet TAKE 1 TABLET BY MOUTH THREE TIMES A DAY AS NEEDED FOR MUSCLE SPASMS 90 tablet 2   carvedilol (COREG) 6.25 MG tablet Take 1 tablet (6.25 mg total) by mouth 2 (two) times daily. 60 tablet 11   Cholecalciferol 100 MCG (4000 UT) CAPS 1 tab by mouth once daily 30 capsule 99   dapagliflozin propanediol (FARXIGA) 10 MG TABS tablet Take 1 tablet (10 mg total) by mouth daily before breakfast. 30 tablet 11   doxepin (SINEQUAN) 10 MG capsule TAKE 2 CAPSULES (20 MG TOTAL) BY MOUTH AT BEDTIME AS NEEDED. 180 capsule 1   Dupilumab (DUPIXENT) 300 MG/2ML SOPN Inject 300 mg into the skin every 14 (fourteen) days.     eplerenone (INSPRA) 50 MG tablet Take 1 tablet (50 mg total) by mouth daily. 90 tablet 3   Evolocumab (REPATHA SURECLICK) 761 MG/ML SOAJ INJECT 1 PEN INTO THE SKIN EVERY 14 (FOURTEEN) DAYS. 6 mL 3   fluticasone (CUTIVATE) 0.05 % cream SMARTSIG:Topical 1-2 Times Daily PRN     fluticasone (FLONASE) 50 MCG/ACT nasal spray PLACE 2 SPRAYS INTO BOTH NOSTRILS DAILY AS NEEDED FOR ALLERGIES OR RHINITIS. 48 mL 1   hydrOXYzine (ATARAX/VISTARIL) 10 MG tablet TAKE 1 TABLET BY MOUTH 3 TIMES DAILY AS NEEDED FOR ITCHING. 60 tablet 2   ketoconazole  (NIZORAL) 2 % cream APPLY 1 APPLICATION TOPICALLY DAILY 30 g 1   lidocaine (LIDODERM) 5 % APPLY 1 PATCH TO AFFECTED AREA  FOR UP TO 12 HOURS . DO NOT USE MORE THAN ONE PATCH IN 24 HOURS. 90 patch 2   losartan (COZAAR) 25 MG tablet TAKE 1/2 TABLET BY MOUTH EVERY DAY 45 tablet 3   omeprazole (PRILOSEC) 20 MG capsule TAKE 1 CAPSULE BY MOUTH TWICE A DAY 180 capsule 1   Potassium Chloride (KLOR-CON PO) Take 20 mcg by mouth daily.     spironolactone (ALDACTONE) 25 MG tablet Take 25 mg by mouth daily.     tamsulosin (FLOMAX) 0.4 MG CAPS capsule Take 0.4 mg by mouth daily.     torsemide (DEMADEX) 20 MG tablet TAKE 3 TABLETS (60 MG TOTAL) BY MOUTH 2 (TWO) TIMES DAILY. 540 tablet 1   traZODone (DESYREL) 100 MG tablet 2 tab by mouth at bedtime as needed 180 tablet 1   triamcinolone (KENALOG) 0.1 % paste Use as directed in the mouth or throat 2 (two) times daily. 5 g 2   TUMS 500 MG chewable tablet Chew 500-2,000 mg by mouth every 4 (four) hours as needed for indigestion or heartburn.      venlafaxine XR (EFFEXOR-XR) 75 MG 24 hr capsule Take 1 capsule (75 mg total) by mouth daily with breakfast. 90 capsule 1   zolpidem (AMBIEN) 10 MG tablet TAKE 1 TABLET BY MOUTH EVERY DAY AT BEDTIME AS NEEDED 90 tablet 1   metolazone (ZAROXOLYN) 2.5 MG tablet Take 1 tablet (2.5 mg total) by mouth as directed. With additional 40 meq of potassium (Patient taking differently: Take 2.5 mg by mouth daily. With additional 40 meq of potassium) 5 tablet 0   No current facility-administered medications for this encounter.   Wt Readings from Last 3 Encounters:  12/15/21 111.2 kg (245 lb 3.2 oz)  10/24/21 112.9 kg (249 lb)  10/17/21 112.5 kg (248 lb)   BP 120/78    Pulse 79    Wt 111.2 kg (245 lb 3.2 oz)    SpO2 96%    BMI 35.18 kg/m  General:  NAD. No resp difficulty, walked into clinic with cane HEENT: Normal Neck: Supple. No JVD. Carotids 2+ bilat; no bruits. No lymphadenopathy or thryomegaly appreciated. Cor: PMI  nondisplaced. Regular rate & rhythm. No rubs, gallops or murmurs. Lungs: Clear Abdomen: Obese, nontender, nondistended. No hepatosplenomegaly. No bruits or masses. Good bowel sounds. Extremities: No cyanosis, clubbing, rash, edema Neuro: Alert & oriented x 3, cranial nerves grossly intact. Moves all 4 extremities w/o difficulty. Affect pleasant.  Assessment/Plan:  1. Atrial fibrillation: Permanent.  Now s/p AV nodal ablation to allow effective BiV pacing.  - Continue Eliquis. No overt bleeding. Recent CBC ok. 2. COPD: No longer smoking.  Not using oxygen.  3. Chronic systolic CHF: Nonischemic cardiomyopathy based on 6/17 cath. However, he had a recent significant fall in EF from 30-35% to 10-15% on echo from 3/18.  LHC/RHC in 4/18 showed nonobstructive CAD and relatively optimized filling pressures.  He now has Medtronic CRT-D device with AV nodal ablation to allow BiV pacing.  Echo 6/19 with EF 25-30% with mildly decreased RV systolic function. CPX 7/19 was submaximal but likely only mild functional limitation from CHF.  Echo in 1/21 showed EF 35-40%, echo in 3/22 showed EF up to 40% with mildly decreased RV systolic function.  NYHA class II, functional class confounded by body habitus and physical deconditioning. He is not overloaded on exam or by Optivol. - Continue Coreg 6.25 mg bid. - Continue torsemide 60 mg bid.  - Continue eplerenone 50 mg daily (off spiro with  gynecomastia).  BMET/BNP today. - Continue losartan 12.5 mg qhs (did not tolerate low dose Entresto with dizziness and low BPs). - Continue Farxiga 10 mg daily. 4. CAD: Nonobstructive on 4/18 cath.  No exertional chest pain.  - No ASA with Eliquis use. - Statin myalgias, continue Repatha. Good LDL in 5/22.  5. Complete heart block: Now has Medtronic CRT-D device and s/p AV nodal ablation.  6. OSA: Unable to tolerate CPAP.   7. Elevated LFTs: Most recent LFTs back to normal.   8. ED: asking for Rx. Will send sildenafil PRN. He is  not on a long-acting nitrate or uses PRN nitro at home. - Further refills will need to come from PCP.  Followup in 3 months with Dr. [name].  Signed, Rafael Bihari, FNP  12/15/2021  [facility] [address] [facility] [place] [address] [phone] (office) [phone] (fax)

## 2021-12-15 NOTE — Progress Notes (Signed)
Medication Samples have been provided to the patient. ? ?Drug name: Eliquis       Strength: 5mg         Qty: 4  LOT: [id number]  Exp.Date: 11/24,03/25 ? ?Dosing instructions: take 1 tab po bid ? ?The patient has been instructed regarding the correct time, dose, and frequency of taking this medication, including desired effects and most common side effects.  ? ?Yolanda Dockendorf R Saori Umholtz ?0:52 PM ?12/15/2021 ? ?

## 2021-12-15 NOTE — Patient Instructions (Addendum)
Labs done today. We will contact you only if your labs are abnormal. ? ?DO NOT TAKE THE SPIRONOLACTONE, ONLY TAKE EPLERENONE ? ?START Sildenafil 50mg  (1 tablet) by mouth only as needed.  ? ?No medication changes were made. Please continue all current medications as prescribed. ? ?Your physician recommends that you schedule a follow-up appointment in: 3-4 months with Dr. [name].  ? ?If you have any questions or concerns before your next appointment please send [place] a message through [place] or call our office at [phone].   ? ?TO LEAVE A MESSAGE FOR THE NURSE SELECT OPTION 2, PLEASE LEAVE A MESSAGE INCLUDING: ?YOUR NAME ?DATE OF BIRTH ?CALL BACK NUMBER ?REASON FOR CALL**this is important as we prioritize the call backs ? ?YOU WILL RECEIVE A CALL BACK THE SAME DAY AS LONG AS YOU CALL BEFORE 4:00 PM ? ? ?Do the following things EVERYDAY: ?Weigh yourself in the morning before breakfast. Write it down and keep it in a log. ?Take your medicines as prescribed ?Eat low salt foods--Limit salt (sodium) to 2000 mg per day.  ?Stay as active as you can everyday ?Limit all fluids for the day to less than 2 liters ? ? ?At the [facility], you and your health needs are our priority. As part of our continuing mission to provide you with exceptional heart care, we have created designated Provider Care Teams. These Care Teams include your primary Cardiologist (physician) and Advanced Practice Providers (APPs- Physician Assistants and Nurse Practitioners) who all work together to provide you with the care you need, when you need it.  ? ?You may see any of the following providers on your designated Care Team at your next follow up: ?Dr Glori Bickers ?Dr Loralie Champagne ?Darrick Grinder, NP ?Lyda Jester, PA ?Audry Riles, PharmD ? ? ?Please be sure to bring in all your medications bottles to every appointment.  ? ?

## 2021-12-17 ENCOUNTER — Other Ambulatory Visit: Payer: Self-pay | Admitting: Internal Medicine

## 2021-12-18 ENCOUNTER — Telehealth (HOSPITAL_COMMUNITY): Payer: Self-pay | Admitting: Pharmacy Technician

## 2021-12-18 ENCOUNTER — Other Ambulatory Visit (HOSPITAL_COMMUNITY): Payer: Self-pay

## 2021-12-18 ENCOUNTER — Other Ambulatory Visit (HOSPITAL_COMMUNITY): Payer: Self-pay | Admitting: *Deleted

## 2021-12-18 MED ORDER — DAPAGLIFLOZIN PROPANEDIOL 10 MG PO TABS: 10.0000 mg | ORAL_TABLET | Freq: Every day | ORAL | 3 refills | Status: DC

## 2021-12-18 NOTE — Telephone Encounter (Addendum)
Advanced [facility] Failure Patient Advocate Encounter ? ?The patient was approved for a [employer] grant that will help cover the cost of Farxiga. He is currently in the donut hole, causing the 90 day copay of Farxiga to be $272 and Eliquis is $90 every 30 days. He is aware that [facility]MS requires 3% OOP to be spent before we can apply for Eliquis assistance. He has samples for now. Based off the income information he provided the OOP requirement would be $600. ? ?Total amount awarded, $10,000. Eligibility dates, 11/18/21-11/17/22. ? ?ID [id number] ? ?BIN [id number] ?  ?PCN [id number] ?  ?GROUP [id number] ? ?Sent 90 day RX request to [employer], corporate) to send to [facility]. Patient was provided copy of information as well.   ? ?Charlann Boxer, CPhT ? ? ?

## 2021-12-19 ENCOUNTER — Other Ambulatory Visit: Payer: Self-pay

## 2021-12-19 ENCOUNTER — Ambulatory Visit: Payer: Self-pay

## 2021-12-19 ENCOUNTER — Ambulatory Visit: Payer: Medicare HMO | Admitting: Family Medicine

## 2021-12-19 VITALS — BP 102/70 | HR 72 | Ht 70.0 in | Wt 256.0 lb

## 2021-12-19 DIAGNOSIS — M12812 Other specific arthropathies, not elsewhere classified, left shoulder: Secondary | ICD-10-CM

## 2021-12-19 DIAGNOSIS — M12811 Other specific arthropathies, not elsewhere classified, right shoulder: Secondary | ICD-10-CM

## 2021-12-19 DIAGNOSIS — M7602 Gluteal tendinitis, left hip: Secondary | ICD-10-CM | POA: Insufficient documentation

## 2021-12-19 DIAGNOSIS — M25511 Pain in right shoulder: Secondary | ICD-10-CM

## 2021-12-19 DIAGNOSIS — G8929 Other chronic pain: Secondary | ICD-10-CM

## 2021-12-19 NOTE — Assessment & Plan Note (Signed)
Rotator cuff arthropathy bilaterally.  Bilateral injections given again today.  This is a chronic problem.  Patient does know at some point may need a possible replacement but wants to hold off at this moment.  Doing better since the knee replacement.  Patient will follow-up with me again 6 to 8 weeks. ?

## 2021-12-19 NOTE — Assessment & Plan Note (Signed)
Patient given injection and tolerated the procedure well, discussed icing regimen and home exercises, increase activity slowly.  Likely more secondary to compensation from patient's knee replacement.  Hopefully patient will respond well to the injection and follow-up with me again in 6 weeks. ?

## 2021-12-19 NOTE — Patient Instructions (Signed)
Injected L hip and L shoulder ?See me again in 8 weeks ?

## 2021-12-20 ENCOUNTER — Ambulatory Visit (INDEPENDENT_AMBULATORY_CARE_PROVIDER_SITE_OTHER): Payer: Medicare HMO | Admitting: Internal Medicine

## 2021-12-20 VITALS — BP 110/68 | HR 73 | Temp 98.6°F | Ht 70.0 in | Wt 246.1 lb

## 2021-12-20 DIAGNOSIS — K130 Diseases of lips: Secondary | ICD-10-CM

## 2021-12-20 DIAGNOSIS — E538 Deficiency of other specified B group vitamins: Secondary | ICD-10-CM

## 2021-12-20 DIAGNOSIS — E559 Vitamin D deficiency, unspecified: Secondary | ICD-10-CM

## 2021-12-20 DIAGNOSIS — E1165 Type 2 diabetes mellitus with hyperglycemia: Secondary | ICD-10-CM

## 2021-12-20 DIAGNOSIS — I10 Essential (primary) hypertension: Secondary | ICD-10-CM

## 2021-12-20 DIAGNOSIS — Z1211 Encounter for screening for malignant neoplasm of colon: Secondary | ICD-10-CM

## 2021-12-20 DIAGNOSIS — E7800 Pure hypercholesterolemia, unspecified: Secondary | ICD-10-CM | POA: Diagnosis not present

## 2021-12-20 DIAGNOSIS — L304 Erythema intertrigo: Secondary | ICD-10-CM

## 2021-12-20 DIAGNOSIS — Z0001 Encounter for general adult medical examination with abnormal findings: Secondary | ICD-10-CM | POA: Diagnosis not present

## 2021-12-20 DIAGNOSIS — H269 Unspecified cataract: Secondary | ICD-10-CM | POA: Diagnosis not present

## 2021-12-20 LAB — CBC WITH DIFFERENTIAL/PLATELET
Basophils Absolute: 0 10*3/uL (ref 0.0–0.1)
Basophils Relative: 0.2 % (ref 0.0–3.0)
Eosinophils Absolute: 0.1 10*3/uL (ref 0.0–0.7)
Eosinophils Relative: 1 % (ref 0.0–5.0)
HCT: 38.1 % — ABNORMAL LOW (ref 39.0–52.0)
Hemoglobin: 12.5 g/dL — ABNORMAL LOW (ref 13.0–17.0)
Lymphocytes Relative: 11 % — ABNORMAL LOW (ref 12.0–46.0)
Lymphs Abs: 1 10*3/uL (ref 0.7–4.0)
MCHC: 32.8 g/dL (ref 30.0–36.0)
MCV: 96.7 fl (ref 78.0–100.0)
Monocytes Absolute: 0.9 10*3/uL (ref 0.1–1.0)
Monocytes Relative: 9.9 % (ref 3.0–12.0)
Neutro Abs: 6.9 10*3/uL (ref 1.4–7.7)
Neutrophils Relative %: 77.9 % — ABNORMAL HIGH (ref 43.0–77.0)
Platelets: 213 10*3/uL (ref 150.0–400.0)
RBC: 3.94 Mil/uL — ABNORMAL LOW (ref 4.22–5.81)
RDW: 13.8 % (ref 11.5–15.5)
WBC: 8.9 10*3/uL (ref 4.0–10.5)

## 2021-12-20 LAB — BASIC METABOLIC PANEL
BUN: 27 mg/dL — ABNORMAL HIGH (ref 6–23)
CO2: 26 mEq/L (ref 19–32)
Calcium: 9.1 mg/dL (ref 8.4–10.5)
Chloride: 106 mEq/L (ref 96–112)
Creatinine, Ser: 1 mg/dL (ref 0.40–1.50)
GFR: 78.15 mL/min (ref >60.00)
Glucose, Bld: 87 mg/dL (ref 70–99)
Potassium: 4.3 mEq/L (ref 3.5–5.1)
Sodium: 140 mEq/L (ref 135–145)

## 2021-12-20 LAB — VITAMIN B12: Vitamin B-12: 706 pg/mL (ref 211–911)

## 2021-12-20 LAB — HEPATIC FUNCTION PANEL
ALT: 15 U/L (ref 0–53)
AST: 17 U/L (ref 0–37)
Albumin: 4.2 g/dL (ref 3.5–5.2)
Alkaline Phosphatase: 63 U/L (ref 39–117)
Bilirubin, Direct: 0.1 mg/dL (ref 0.0–0.3)
Total Bilirubin: 0.3 mg/dL (ref 0.2–1.2)
Total Protein: 6.6 g/dL (ref 6.0–8.3)

## 2021-12-20 LAB — PSA: PSA: 2.62 ng/mL (ref 0.10–4.00)

## 2021-12-20 LAB — LIPID PANEL
Cholesterol: 99 mg/dL (ref 0–200)
HDL: 45.2 mg/dL (ref >39.00)
LDL Cholesterol: 28 mg/dL (ref 0–99)
NonHDL: 54.24
Total CHOL/HDL Ratio: 2
Triglycerides: 132 mg/dL (ref 0.0–149.0)
VLDL: 26.4 mg/dL (ref 0.0–40.0)

## 2021-12-20 LAB — URINALYSIS, ROUTINE W REFLEX MICROSCOPIC
Bilirubin Urine: NEGATIVE
Hgb urine dipstick: NEGATIVE
Ketones, ur: NEGATIVE
Leukocytes,Ua: NEGATIVE
Nitrite: NEGATIVE
RBC / HPF: NONE SEEN (ref 0–?)
Specific Gravity, Urine: 1.01 (ref 1.000–1.030)
Total Protein, Urine: NEGATIVE
Urine Glucose: NEGATIVE
Urobilinogen, UA: 0.2 (ref 0.0–1.0)
pH: 5.5 (ref 5.0–8.0)

## 2021-12-20 LAB — MICROALBUMIN / CREATININE URINE RATIO
Creatinine,U: 33.2 mg/dL
Microalb Creat Ratio: 2.1 mg/g (ref 0.0–30.0)
Microalb, Ur: <0.7 mg/dL (ref 0.0–1.9)

## 2021-12-20 LAB — VITAMIN D 25 HYDROXY (VIT D DEFICIENCY, FRACTURES): VITD: 34.25 ng/mL (ref 30.00–100.00)

## 2021-12-20 LAB — HEMOGLOBIN A1C: Hgb A1c MFr Bld: 5.4 % (ref 4.6–6.5)

## 2021-12-20 LAB — TSH: TSH: 1.09 u[IU]/mL (ref 0.35–5.50)

## 2021-12-20 MED ORDER — TRIAMCINOLONE ACETONIDE 0.1 % MT PSTE
PASTE | Freq: Two times a day (BID) | OROMUCOSAL | 2 refills | Status: DC
Start: 2021-12-20 — End: 2022-03-20

## 2021-12-20 MED ORDER — FLUCONAZOLE 100 MG PO TABS: 100.0000 mg | ORAL_TABLET | Freq: Every day | ORAL | 0 refills | Status: DC

## 2021-12-20 NOTE — Progress Notes (Signed)
Patient ID: [name], male   DOB: 1956-09-12, 66 y.o.   MRN: [id number]         Chief Complaint:: wellness exam and Follow-up and Rash (Rash under stomach area )  And cold sore, dm, hld. htn       HPI:  [name] is a 66 y.o. male here for wellness exam; due for eye exam and colonoscopy and needs referrals, declines covid booster and shingrix, o/w up to date                        Also c/o large erythem rash with mild discomfort itchy to below the pannus at the bilateral groin area, mild, constant, 2 mo worsening, nothing really makes better or worse including a steroid cream he had leftover at home.  Pt denies chest pain, increased sob or doe, wheezing, orthopnea, PND, increased LE swelling, palpitations, dizziness or syncope.   Pt denies polydipsia, polyuria, or new focal neuro s/s.   Pt denies fever, wt loss, night sweats, loss of appetite, or other constitutional symptoms  No other new complaints except for onset incidentally cold sore to the left upper lip  x 3 days with discomfort.     Wt Readings from Last 3 Encounters:  12/20/21 246 lb 2 oz (111.6 kg)  12/19/21 256 lb (116.1 kg)  12/15/21 245 lb 3.2 oz (111.2 kg)   BP Readings from Last 3 Encounters:  12/20/21 110/68  12/19/21 102/70  12/15/21 120/78   Immunization History  Administered Date(s) Administered   Influenza Inj Mdck Quad With Preservative 07/22/2019   Influenza Split 07/09/2011, 07/23/2012   Influenza Whole 09/16/2006, 08/26/2007, 07/27/2008, 07/26/2009, 07/07/2010   Influenza, High Dose Seasonal PF 06/22/2020, 07/07/2021   Influenza,inj,Quad PF,6+ Mos 06/30/2013, 08/19/2014, 06/27/2015, 07/13/2016, 07/09/2017, 07/18/2018   PFIZER(Purple Top)SARS-COV-2 Vaccination 03/21/2020, 04/11/2020, 11/03/2020   Pneumococcal Conjugate-13 04/29/2017   Pneumococcal Polysaccharide-23 03/29/2010, 07/07/2010, 02/23/2015, 03/17/2021   Td 10/16/2003   Tdap 02/23/2015   Health Maintenance Due  Topic Date Due   COLONOSCOPY  (Pts 45-74yr Insurance coverage will need to be confirmed)  03/03/2020   OPHTHALMOLOGY EXAM  02/04/2021      Past Medical History:  Diagnosis Date   AICD (automatic cardioverter/defibrillator) present    Anemia    supposed to be taking Vit B but doesn't   ANXIETY    takes Xanax nightly   Arthritis    Asthma    Albuterol prn and Advair daily;also takes Prednisone daily   Atrial fibrillation ([facility] 09/06/2015   Cardiomyopathy ([facility]    a. EF 25% TEE July 2013; b. EF normalized 2015;  c. 03/2015 Echo: EF 40-45%, difrf HK, PASP 38 mmHg, Mild MR, sev LAE/RAE.   CHF (congestive heart failure) (HCC)    Chronic constipation    takes OTC stool softener   COPD (chronic obstructive pulmonary disease) (HCC)    "one dr says COPD; one dr says emphysema" (09/18/2017)   DEPRESSION    takes Zoloft and Doxepin daily   Diverticulitis    DYSKINESIA, ESOPHAGUS    Dysrhythmia    Atrial Fibrilation   Emphysema of lung ([facility]    "one dr says COPD; one dr says emphysema" (09/18/2017)   Essential hypertension        FIBROMYALGIA    GERD (gastroesophageal reflux disease)        Glaucoma    HYPERLIPIDEMIA    a. Intolerant to statins.   INSOMNIA    takes Ambien nightly  Myocardial infarction [facility])    a. 2012 Myoview notable for prior infarct;  b. 03/2015 Lexiscan CL: EF 37%, diff HK, small area of inferior infarct from apex to base-->Med Rx.   Myocardial infarction ([place])    O2 dependent    "2.5L q hs & prn" (09/18/2017)   Paroxysmal atrial fibrillation ([place])    a. CHA2DS2VASc = 3--> takes Coumadin;  b. 03/15/2015 Successful TEE/DCCV;  c. 03/2015 recurrent afib, Amio d/c'd in setting of hyperthyroidism.   Peripheral neuropathy    Pneumonia 12/2016   Rash and other nonspecific skin eruption 04/12/2009   no cause found saw dermatologists x 2 and allergist   SLEEP APNEA, OBSTRUCTIVE    a. doesn't use CPAP   Syncope    a. 03/2015 s/p MDT LINQ.   Type II diabetes mellitus ([place])        Past Surgical  History:  Procedure Laterality Date   ACNE CYST REMOVAL     2 on back    AV NODE ABLATION N/A 10/25/2017   Procedure: AV NODE ABLATION;  Surgeon: Deboraha Sprang, MD;  Location: [place] [facility];  Service: Cardiovascular;  Laterality: N/A;   BIV ICD INSERTION CRT-D N/A 09/18/2017   Procedure: BIV ICD INSERTION CRT-D;  Surgeon: Deboraha Sprang, MD;  Location: [place] [facility];  Service: Cardiovascular;  Laterality: N/A;   CARDIAC CATHETERIZATION N/A 03/21/2016   Procedure: Right/Left Heart Cath and Coronary Angiography;  Surgeon: Larey Dresser, MD;  Location: [place] [facility];  Service: Cardiovascular;  Laterality: N/A;   CARDIOVERSION  04/18/2012   Procedure: CARDIOVERSION;  Surgeon: Fay Records, MD;  Location: [place];  Service: Cardiovascular;  Laterality: N/A;   CARDIOVERSION  04/25/2012   Procedure: CARDIOVERSION;  Surgeon: Thayer Headings, MD;  Location: [place];  Service: Cardiovascular;  Laterality: N/A;   CARDIOVERSION  04/25/2012   Procedure: CARDIOVERSION;  Surgeon: Fay Records, MD;  Location: [place];  Service: Cardiovascular;  Laterality: N/A;   CARDIOVERSION  05/09/2012   Procedure: CARDIOVERSION;  Surgeon: Sherren Mocha, MD;  Location: [place];  Service: Cardiovascular;  Laterality: N/A;  changed from crenshaw to cooper by trish/leone-endo   CARDIOVERSION N/A 03/15/2015   Procedure: CARDIOVERSION;  Surgeon: Thayer Headings, MD;  Location: [place];  Service: Cardiovascular;  Laterality: N/A;   COLONOSCOPY     COLONOSCOPY WITH PROPOFOL N/A 10/21/2014   Procedure: COLONOSCOPY WITH PROPOFOL;  Surgeon: Ladene Artist, MD;  Location: [facility];  Service: Endoscopy;  Laterality: N/A;   EP IMPLANTABLE DEVICE N/A 04/06/2015   Procedure: Loop Recorder Insertion;  Surgeon: Evans Lance, MD;  Location: [place] [facility];  Service: Cardiovascular;  Laterality: N/A;   ESOPHAGOGASTRODUODENOSCOPY     JOINT REPLACEMENT     LOOP RECORDER REMOVAL N/A 09/18/2017   Procedure: LOOP  RECORDER REMOVAL;  Surgeon: Deboraha Sprang, MD;  Location: [place] [facility];  Service: Cardiovascular;  Laterality: N/A;   RIGHT/LEFT HEART CATH AND CORONARY ANGIOGRAPHY N/A 01/28/2017   Procedure: Right/Left Heart Cath and Coronary Angiography;  Surgeon: Larey Dresser, MD;  Location: [place] [facility];  Service: Cardiovascular;  Laterality: N/A;   TEE WITHOUT CARDIOVERSION  04/25/2012   Procedure: TRANSESOPHAGEAL ECHOCARDIOGRAM (TEE);  Surgeon: Thayer Headings, MD;  Location: [place];  Service: Cardiovascular;  Laterality: N/A;   TEE WITHOUT CARDIOVERSION N/A 03/15/2015   Procedure: TRANSESOPHAGEAL ECHOCARDIOGRAM (TEE);  Surgeon: Thayer Headings, MD;  Location: [place];  Service: Cardiovascular;  Laterality:  N/A;   TONSILLECTOMY AND ADENOIDECTOMY     TOTAL KNEE ARTHROPLASTY Right 06/15/2014   Procedure: TOTAL KNEE ARTHROPLASTY;  Surgeon: Renette Butters, MD;  Location: [place];  Service: Orthopedics;  Laterality: Right;   TOTAL KNEE ARTHROPLASTY Left 10/17/2021   Procedure: Left TOTAL KNEE ARTHROPLASTY;  Surgeon: Mcarthur Rossetti, MD;  Location: [place];  Service: Orthopedics;  Laterality: Left;    reports that he quit smoking about 14 years ago. His smoking use included cigarettes. He has a 60.00 pack-year smoking history. He has never used smokeless tobacco. He reports that he does not drink alcohol and does not use drugs. family history includes Allergies in his mother; Asthma in his maternal grandmother and mother; COPD in his mother; Colon polyps in his mother; Hypothyroidism in his mother. Allergies  Allergen Reactions   Amiodarone Other (See Comments)    hyperthyroidism   Statins Other (See Comments)    myalgia   Tape Other (See Comments)    Skin Tears Use Paper Tape Only   Current Outpatient Medications on File Prior to Visit  Medication Sig Dispense Refill   acetaminophen (TYLENOL) 500 MG tablet Take 500 mg by mouth every 6 (six) hours as needed for moderate pain.      albuterol (VENTOLIN HFA) 108 (90 Base) MCG/ACT inhaler INHALE 2 PUFFS BY MOUTH 4 TIMES A DAY AS NEEDED 54 each 3   ALPRAZolam (XANAX) 0.5 MG tablet TAKE 2 TABLETS BY MOUTH AT BEDTIME 60 tablet 2   AMBULATORY NON FORMULARY MEDICATION Rolling walker with a bench 1 Units 0   apixaban (ELIQUIS) 5 MG TABS tablet Take 1 tablet (5 mg total) by mouth 2 (two) times daily. 60 tablet 11   augmented betamethasone dipropionate (DIPROLENE-AF) 0.05 % cream Apply 1 application topically daily as needed (itching).     carisoprodol (SOMA) 350 MG tablet TAKE 1 TABLET BY MOUTH THREE TIMES A DAY AS NEEDED FOR MUSCLE SPASMS 90 tablet 2   carvedilol (COREG) 6.25 MG tablet Take 1 tablet (6.25 mg total) by mouth 2 (two) times daily. 60 tablet 11   Cholecalciferol 100 MCG (4000 UT) CAPS 1 tab by mouth once daily 30 capsule 99   dapagliflozin propanediol (FARXIGA) 10 MG TABS tablet Take 1 tablet (10 mg total) by mouth daily before breakfast. 90 tablet 3   doxepin (SINEQUAN) 10 MG capsule TAKE 2 CAPSULES (20 MG TOTAL) BY MOUTH AT BEDTIME AS NEEDED. 180 capsule 1   Dupilumab (DUPIXENT) 300 MG/2ML SOPN Inject 300 mg into the skin every 14 (fourteen) days.     eplerenone (INSPRA) 50 MG tablet Take 1 tablet (50 mg total) by mouth daily. 90 tablet 3   Evolocumab (REPATHA SURECLICK) 330 MG/ML SOAJ INJECT 1 PEN INTO THE SKIN EVERY 14 (FOURTEEN) DAYS. 6 mL 3   fluticasone (CUTIVATE) 0.05 % cream SMARTSIG:Topical 1-2 Times Daily PRN     fluticasone (FLONASE) 50 MCG/ACT nasal spray PLACE 2 SPRAYS INTO BOTH NOSTRILS DAILY AS NEEDED FOR ALLERGIES OR RHINITIS. 48 mL 1   hydrOXYzine (ATARAX/VISTARIL) 10 MG tablet TAKE 1 TABLET BY MOUTH 3 TIMES DAILY AS NEEDED FOR ITCHING. 60 tablet 2   ketoconazole (NIZORAL) 2 % cream APPLY 1 APPLICATION TOPICALLY DAILY 30 g 1   lidocaine (LIDODERM) 5 % APPLY 1 PATCH TO AFFECTED AREA FOR UP TO 12 HOURS . DO NOT USE MORE THAN ONE PATCH IN 24 HOURS. 90 patch 2   losartan (COZAAR) 25 MG tablet TAKE 1/2  TABLET BY MOUTH EVERY DAY  45 tablet 3   omeprazole (PRILOSEC) 20 MG capsule TAKE 1 CAPSULE BY MOUTH TWICE A DAY 180 capsule 1   Potassium Chloride (KLOR-CON PO) Take 20 mcg by mouth daily.     sildenafil (VIAGRA) 50 MG tablet Take 1 tablet (50 mg total) by mouth as needed for erectile dysfunction. 20 tablet 0   tamsulosin (FLOMAX) 0.4 MG CAPS capsule Take 0.4 mg by mouth daily.     torsemide (DEMADEX) 20 MG tablet TAKE 3 TABLETS (60 MG TOTAL) BY MOUTH 2 (TWO) TIMES DAILY. 540 tablet 1   traZODone (DESYREL) 100 MG tablet 2 tab by mouth at bedtime as needed 180 tablet 1   TUMS 500 MG chewable tablet Chew 500-2,000 mg by mouth every 4 (four) hours as needed for indigestion or heartburn.      venlafaxine XR (EFFEXOR-XR) 75 MG 24 hr capsule Take 1 capsule (75 mg total) by mouth daily with breakfast. 90 capsule 1   zolpidem (AMBIEN) 10 MG tablet TAKE 1 TABLET BY MOUTH EVERY DAY AT BEDTIME AS NEEDED 90 tablet 1   metolazone (ZAROXOLYN) 2.5 MG tablet Take 1 tablet (2.5 mg total) by mouth as directed. With additional 40 meq of potassium (Patient taking differently: Take 2.5 mg by mouth daily. With additional 40 meq of potassium) 5 tablet 0   No current facility-administered medications on file prior to visit.        ROS:  All others reviewed and negative.  Objective        PE:  BP 110/68    Pulse 73    Temp 98.6 F (37 C) (Oral)    Ht '5\' 10"'$  (1.778 m)    Wt 246 lb 2 oz (111.6 kg)    SpO2 94%    BMI 35.32 kg/m                 Constitutional: Pt appears in NAD               HENT: Head: NCAT.                Right Ear: External ear normal.                 Left Ear: External ear normal.                Eyes: . Pupils are equal, round, and reactive to light. Conjunctivae and EOM are normal               Nose: without d/c or deformity               Neck: Neck supple. Gross normal ROM               Cardiovascular: Normal rate and regular rhythm.                 Pulmonary/Chest: Effort normal and breath  sounds without rales or wheezing.                Abd:  Soft, NT, ND, + BS, no organomegaly               Neurological: Pt is alert. At baseline orientation, motor grossly intact               Skin: Skin is warm, LE edema - chronic trace to 1+ bilateral, has large slightly macerated erythem rash belt like to the bilateral groin areas with slight weepiness but no ulcerations, minimal tender, no swelling;  also  has several herpetic lesions to left upper lip in a group               Psychiatric: Pt behavior is normal without agitation   Micro: none  Cardiac tracings I have personally interpreted today:  none  Pertinent Radiological findings (summarize): none   Lab Results  Component Value Date   WBC 8.9 12/20/2021   HGB 12.5 (L) 12/20/2021   HCT 38.1 (L) 12/20/2021   PLT 213.0 12/20/2021   GLUCOSE 87 12/20/2021   CHOL 99 12/20/2021   TRIG 132.0 12/20/2021   HDL 45.20 12/20/2021   LDLDIRECT 72.0 02/28/2021   LDLCALC 28 12/20/2021   ALT 15 12/20/2021   AST 17 12/20/2021   NA 140 12/20/2021   K 4.3 12/20/2021   CL 106 12/20/2021   CREATININE 1.00 12/20/2021   BUN 27 (H) 12/20/2021   CO2 26 12/20/2021   TSH 1.09 12/20/2021   PSA 2.62 12/20/2021   INR 1.1 05/08/2021   HGBA1C 5.4 12/20/2021   MICROALBUR <0.7 12/20/2021   Assessment/Plan:  [name] is a 66 y.o. White or Caucasian [1] male with  has a past medical history of AICD (automatic cardioverter/defibrillator) present, Anemia, ANXIETY, Arthritis, Asthma, Atrial fibrillation ([place]) (09/06/2015), Cardiomyopathy ([place]), CHF (congestive heart failure) ([place]), Chronic constipation, COPD (chronic obstructive pulmonary disease) ([place]), DEPRESSION, Diverticulitis, DYSKINESIA, ESOPHAGUS, Dysrhythmia, Emphysema of lung ([place]), Essential hypertension, FIBROMYALGIA, GERD (gastroesophageal reflux disease), Glaucoma, HYPERLIPIDEMIA, INSOMNIA, Myocardial infarction [facility]), Myocardial infarction ([place]), O2 dependent, Paroxysmal atrial fibrillation  ([place]), Peripheral neuropathy, Pneumonia (12/2016), Rash and other nonspecific skin eruption (04/12/2009), SLEEP APNEA, OBSTRUCTIVE, Syncope, and Type II diabetes mellitus ([place]).  Encounter for well adult exam with abnormal findings Age and sex appropriate education and counseling updated with regular exercise and diet Referrals for preventative services - for referral to optho and colonoscopy Immunizations addressed - declines covid booster and shingrx Smoking counseling  - none needed Evidence for depression or other mood disorder - none significant Most recent labs reviewed. I have personally reviewed and have noted: 1) the patient's medical and social history 2) The patient's current medications and supplements 3) The patient's height, weight, and BMI have been recorded in the chart   Type 2 diabetes mellitus ([place]) Lab Results  Component Value Date   HGBA1C 5.4 12/20/2021   Stable, pt to continue current medical treatment farxiga   Hyperlipidemia Lab Results  Component Value Date   [place] 28 12/20/2021   Stable, pt to continue current statin repatha   Vitamin D deficiency Last vitamin D Lab Results  Component Value Date   VD25OH 34.25 12/20/2021   Low, to start ral replacement   Essential hypertension BP Readings from Last 3 Encounters:  12/20/21 110/68  12/19/21 102/70  12/15/21 120/78   Stable, pt to continue medical treatment coreg, losartan   B12 deficiency Lab Results  Component Value Date   VITAMINB12 706 12/20/2021   Stable, cont oral replacement - b12 1000 mcg qd   Lip lesion C/w cold sores - for valtrex asd  Intertrigo With rather large candidal type rash to bilateral groin areas - for diflucan course and keoconozole cr prn  Followup: No follow-ups on file.  [name], MD 12/24/2021 7:53 AM [facility]

## 2021-12-20 NOTE — Patient Instructions (Addendum)
You will be contacted regarding the referral for: eye doctor and GI ? ?Please take all new medication as prescribed - diflucan (to [facility]) ? ?Please continue all other medications as before, and refills have been done if requested - triam paste ? ?Please have the pharmacy call with any other refills you may need. ? ?Please continue your efforts at being more active, low cholesterol diet, and weight control. ? ?You are otherwise up to date with prevention measures today. ? ?Please keep your appointments with your specialists as you may have planned ? ?Please go to the LAB at the blood drawing area for the tests to be done ? ?You will be contacted by phone if any changes need to be made immediately.  Otherwise, you will receive a letter about your results with an explanation, but please check with MyChart first. ? ?Please remember to sign up for MyChart if you have not done so, as this will be important to you in the future with finding out test results, communicating by private email, and scheduling acute appointments online when needed. ? ?Please make an Appointment to return in 6 months, or sooner if needed ? ? ? ? ?

## 2021-12-24 DIAGNOSIS — L304 Erythema intertrigo: Secondary | ICD-10-CM | POA: Insufficient documentation

## 2021-12-24 NOTE — Assessment & Plan Note (Signed)
BP Readings from Last 3 Encounters:  ?12/20/21 110/68  ?12/19/21 102/70  ?12/15/21 120/78  ? ?Stable, pt to continue medical treatment coreg, losartan ? ?

## 2021-12-24 NOTE — Assessment & Plan Note (Signed)
With rather large candidal type rash to bilateral groin areas - for diflucan course and keoconozole cr prn ?

## 2021-12-24 NOTE — Assessment & Plan Note (Signed)
Last vitamin D ?Lab Results  ?Component Value Date  ? VD25OH 34.25 12/20/2021  ? ?Low, to start ral replacement ? ?

## 2021-12-24 NOTE — Assessment & Plan Note (Signed)
Age and sex appropriate education and counseling updated with regular exercise and diet ?Referrals for preventative services - for referral to optho and colonoscopy ?Immunizations addressed - declines covid booster and shingrx ?Smoking counseling  - none needed ?Evidence for depression or other mood disorder - none significant ?Most recent labs reviewed. ?I have personally reviewed and have noted: ?1) the patient's medical and social history ?2) The patient's current medications and supplements ?3) The patient's height, weight, and BMI have been recorded in the chart ? ?

## 2021-12-24 NOTE — Assessment & Plan Note (Signed)
Lab Results  ?Component Value Date  ? [id number] 706 12/20/2021  ? ?Stable, cont oral replacement - b12 1000 mcg qd ? ?

## 2021-12-24 NOTE — Assessment & Plan Note (Signed)
Lab Results  ?Component Value Date  ? [place] 28 12/20/2021  ? ?Stable, pt to continue current statin repatha ? ?

## 2021-12-24 NOTE — Assessment & Plan Note (Signed)
C/w cold sores - for valtrex asd ?

## 2021-12-24 NOTE — Assessment & Plan Note (Signed)
Lab Results  ?Component Value Date  ? HGBA1C 5.4 12/20/2021  ? ?Stable, pt to continue current medical treatment farxiga ? ?

## 2021-12-25 ENCOUNTER — Encounter: Payer: Self-pay | Admitting: Internal Medicine

## 2021-12-25 ENCOUNTER — Other Ambulatory Visit: Payer: Self-pay | Admitting: Internal Medicine

## 2021-12-25 ENCOUNTER — Ambulatory Visit (INDEPENDENT_AMBULATORY_CARE_PROVIDER_SITE_OTHER): Payer: Medicare HMO

## 2021-12-25 DIAGNOSIS — Z95810 Presence of automatic (implantable) cardiac defibrillator: Secondary | ICD-10-CM

## 2021-12-25 DIAGNOSIS — I5032 Chronic diastolic (congestive) heart failure: Secondary | ICD-10-CM | POA: Diagnosis not present

## 2021-12-25 DIAGNOSIS — H538 Other visual disturbances: Secondary | ICD-10-CM

## 2021-12-26 NOTE — Progress Notes (Signed)
EPIC Encounter for ICM Monitoring ? ?Patient Name: [name] is a 66 y.o. male ?Date: 12/26/2021 ?Primary Care Physican: [name], MD ?Primary Cardiologist: [name] ?Electrophysiologist: [name] ?Bi-V Pacing: 99.4%    ?11/22/2021 Weight: 244.5 lbs ?12/26/2021 Weight: 245 lbs ?    ?Spoke with patient and heart failure questions reviewed.  Pt asymptomatic for fluid accumulation.   ?  ?Optivol thoracic impedance suggesting possible fluid accumulation starting 3/2. ?  ?Prescribed:  ?Torsemide 20 mg to 3 tablets (60 mg total) by mouth twice a day.   ?Potassium 20 mEq take 1 tablets (20 mEq total) daily ?  ?Labs: ?12/20/2021 Creatinine 1.00, BUN 27, Potassium 4.3, Sodium 140, GFR 78.15 ?12/15/2021 Creatinine 1.05, BUN 23, Potassium 4.5, Sodium 137, GFR >60 ?10/18/2021 Creatinine 1.27, BUN 23, Potassium 5.0, Sodium 136, GFR >60 ?09/25/2021 Creatinine 1.13, BUN 20, Potassium 4.8, Sodium 138, GFR >60  ?09/15/2021 Creatinine 1.39, BUN 31, Potassium 4.3, Sodium 136, GFR 56  ?06/28/2021 Creatinine 1.14, BUN 22, Potassium 3.7, Sodium 137, GFR >60  ?A complete set of results can be found in Results Review. ?  ?Recommendations:  Pt self adjusts Torsemide as discussed with physician and will take extra Torsemide for 2 days ?  ?Follow-up plan: ICM clinic phone appointment on 01/01/2022 to recheck fluid levels.   91 day device clinic remote transmission 01/24/2022. ?  ?EP/Cardiology Office Visits:   03/20/2022 with Dr [name].  04/05/2022 with Dr [name].    ?  ?Copy of ICM check sent to Dr. [name].   ? ?3 month ICM trend: 12/24/2021. ? ? ? ?12-14 Month ICM trend:  ? ? ? ?[name], RN ?12/26/2021 ?10:24 AM ? ?

## 2021-12-28 ENCOUNTER — Other Ambulatory Visit: Payer: Self-pay | Admitting: Internal Medicine

## 2021-12-28 ENCOUNTER — Ambulatory Visit: Payer: Medicare HMO | Admitting: Podiatry

## 2021-12-28 ENCOUNTER — Ambulatory Visit (INDEPENDENT_AMBULATORY_CARE_PROVIDER_SITE_OTHER): Payer: Medicare HMO | Admitting: *Deleted

## 2021-12-28 NOTE — Patient Instructions (Signed)
Visit Information ? ?[name], thank you for taking time to talk with me today. Please don't hesitate to contact me if I can be of assistance to you before our next scheduled telephone appointment. ? ?Below are the goals we discussed today:  ?Patient Self-Care Activities: ?Patient Ketih "[name]" will: ?When you begin to start your own exercise program: please start slow and conservatively; gradually build up your exercise program over time ?Continue to take precautions to prevent falls- use your cane as needed, especially as you continue in your recuperation from knee surgery ?Continue to weigh yourself daily and write your weights at home down on paper or on the calendar- we will review these each time we have a telephone visit.  Today you reported a weight of 246 lbs ?Call office if I gain more than 2 pounds in one day or 5 pounds in one week, especially if this is associated with chest tightness, swelling in your legs/ ankles, and new or worsened shortness of breath ?Take your medications as prescribed; call your doctors if you develop concerns around your medications ?Watch for swelling in feet, ankles and legs every day: contact your cardiologist if you believe your swelling is increasing ?Keep legs up while sitting ?Continue to limit the salt in your diet: decrease your salt intake and use salt only in moderation ?If you develop signs or symptoms of being in Atrial Fibrillation or having heart rhythm disturbances, call your cardiology (heart doctor) team right away ? ?Our next scheduled telephone follow up visit/ appointment is scheduled on:   Thursday, March 22, 2022 at 3:00 pm- This is a PHONE CALL appointment ? ?If you need to cancel or re-schedule our visit, please call [phone] and our care guide team will be happy to assist you. ?  ?I look forward to hearing about your progress. ?  ?[name], RN, BSN, CCRN Alumnus ?[place] ?([phone]: direct  office ? ?If you are experiencing a Mental Health or [place] or need someone to talk to, please  ?call the Suicide and Crisis Lifeline: 988 ?call the [place] National Suicide Prevention Lifeline: 320-880-6731 or TTY: 7051589425 TTY 9185484991) to talk to a trained counselor ?call 1-800-273-TALK (toll free, 24 hour hotline) ?go to [facility] [address], [place] [phone]) ?call 911  ? ?Patient verbalizes understanding of instructions and care plan provided today and agrees to view in [place]. Active MyChart status confirmed with patient ? ?Cataract ?A cataract is a buildup of protein that causes the lens of the eye to become cloudy. The lens is normally clear. It is the part of the eye that is behind the iris and pupil. The lens focuses light on the retina, which lets you see clearly. When a lens becomes cloudy, your vision may become blurry. The clouding can range from a tiny dot of blurriness to complete cloudiness. ?As some cataracts develop, they can make it harder for you to see things that are far away. (You become more nearsighted.) Other cataracts increase glare or cause more problems with reading. Cataracts usually get worse over time, and sometimes the pupil can look white. As cataracts get worse, they cloud more of the lens, making it difficult to see. Cataracts can affect one eye or both eyes. ?What are the causes? ?This condition is usually caused by age-related eye changes. The lens of the eye is mostly made up of water and protein. Normally, this protein is arranged in  a way that keeps the lens clear. ?Cataracts develop when protein begins to clump together over time. This buildup of protein clouds the lens and lets less light pass through to the retina, which causes blurry vision. ?What increases the risk? ?You are more likely to develop this condition if you: ?Are 96 years of age or older. ?Have diabetes. ?Take certain medicines for  long periods of time, such as steroids or hormone replacement therapy. ?Have had a previous eye surgery. ?Have a family history of cataracts. ?Other conditions associated with developing cataracts include: ?Having high blood pressure. ?Having had a previous eye injury. ?Smoking. ?Being obese. ?Drinking alcohol heavily. ?Having been exposed to large amounts of radiation, lead, or other toxic substances. ?Frequently being exposed to sun or very strong light without eye protection. ?What are the signs or symptoms? ?The main symptom of a cataract is blurry vision. Your vision may change or get worse over time. Other symptoms include: ?Increased glare. ?Seeing a bright ring or halo around light. ?Poor night vision. ?Double vision or seeing shadows in one eye or both eyes. ?Having trouble seeing or reading up close. ?Seeing colors that appear faded. ?How is this diagnosed? ?This condition is diagnosed with a medical history and eye exam. ?You should see an eye specialist (optometrist or ophthalmologist). ?Your health care provider may enlarge (dilate) your pupils with eye drops to see the back of your eye more clearly and look for signs of cataracts or other eye problems. ?You may also have tests, including: ?A visual acuity test. This uses a chart to determine the smallest letters that you can see from a specific distance. ?A slit-lamp exam. This uses a microscope to examine small sections of your eye for abnormalities. ?Tonometry. This test measures the pressure of the fluid inside your eye. ?Glare testing. This test shines a light in your eye while you view letters to see whether the bright light affects your vision. ?How is this treated? ?Treatment depends on the stage of your cataract. You may benefit from: ?A new prescription for eyeglasses or contact lenses. You can also use stronger light, especially for reading. These are mainly for early-stage cataracts. ?Having cataract surgery if the condition is severely  affecting your vision. This is needed for late-stage cataracts. ?Follow these instructions at home: ?Lifestyle ?Use stronger or brighter lighting. ?Consider using a magnifying glass for reading or other activities. ?Become familiar with your surroundings. Having poor vision can put you at greater risk for tripping, falling, or bumping into things. ?Wear sunglasses and a hat if you are sensitive to bright light or are having problems with glare. ?Do not use any products that contain nicotine or tobacco. These products include cigarettes, chewing tobacco, and vaping devices, such as e-cigarettes. If you need help quitting, ask your health care provider. ?General instructions ?If you are prescribed new eyeglasses or contacts, wear them as told by your health care provider. ?Take over-the-counter and prescription medicines only as told by your health care provider. ?Do not change your medicines unless told to by your health care provider. ?Do not drive or use machinery if your vision is blurry, especially at night. ?Keep your blood sugar under control if you have diabetes. ?Keep all follow-up visits. This is important. ?Contact a health care provider if: ?Your symptoms get worse. ?Your vision affects your ability to perform daily activities, especially driving. ?You have new symptoms. ?Get help right away if: ?You have sudden vision loss. ?You have redness, swelling, or increasing pain in  your eye. ?You develop a sudden, severe headache and increased sensitivity to light. ?Summary ?A cataract is a buildup of protein that causes the lens of your eye to become cloudy. Cataracts are very common, especially as people age. ?Mild cataracts cause mild visual symptoms, while more severe cataracts can cause a significant decrease in quality of life. ?Mild cataracts can often be treated with a prescription for new glasses or contact lenses, while surgery is often recommended for more severe cataracts. ?Contact a health care  provider if your symptoms get worse or your vision affects your ability to do daily activities. ?Get help right away if you have sudden vision loss, redness, swelling, or increasing pain in the eye, or you develop

## 2021-12-28 NOTE — Chronic Care Management (AMB) (Signed)
?Chronic Care Management  ? ?CCM RN Visit Note ? ?12/28/2021 ?Name: [name] MRN: [id number] DOB: 23-Mar-1956 ? ?Subjective: ?[name] is a 66 y.o. year old male who is a primary care patient of [name], MD. The care management team was consulted for assistance with disease management and care coordination needs.   ? ?Engaged with patient by telephone for follow up visit in response to provider referral for case management and/or care coordination services.  ? ?Consent to Services:  ?The patient was given information about Chronic Care Management services, agreed to services, and gave verbal consent prior to initiation of services.  Please see initial visit note for detailed documentation.  ?Patient agreed to services and verbal consent obtained.  ? ?Assessment: Review of patient past medical history, allergies, medications, health status, including review of consultants reports, laboratory and other test data, was performed as part of comprehensive evaluation and provision of chronic care management services.  ? ?SDOH (Social Determinants of Health) assessments and interventions performed:  ?SDOH Interventions   ? ?Flowsheet Row Most Recent Value  ?SDOH Interventions   ?Food Insecurity Interventions Intervention Not Indicated  [continues to deny food insecurity]  ?Housing Interventions Intervention Not Indicated  [continues to live with daughter and son-in-law]  ?Transportation Interventions Intervention Not Indicated  [Reports continues to drive self]  ? ?  ?CCM Care Plan ? ?Allergies  ?Allergen Reactions  ? Amiodarone Other (See Comments)  ?  hyperthyroidism  ? Statins Other (See Comments)  ?  myalgia  ? Tape Other (See Comments)  ?  Skin Tears Use Paper Tape Only  ? ?Outpatient Encounter Medications as of 12/28/2021  ?Medication Sig  ? acetaminophen (TYLENOL) 500 MG tablet Take 500 mg by mouth every 6 (six) hours as needed for moderate pain.  ? albuterol (VENTOLIN HFA) 108 (90 Base) MCG/ACT inhaler  INHALE 2 PUFFS BY MOUTH 4 TIMES A DAY AS NEEDED  ? ALPRAZolam (XANAX) 0.5 MG tablet TAKE 2 TABLETS BY MOUTH AT BEDTIME  ? AMBULATORY NON FORMULARY MEDICATION Rolling walker with a bench  ? apixaban (ELIQUIS) 5 MG TABS tablet Take 1 tablet (5 mg total) by mouth 2 (two) times daily.  ? augmented betamethasone dipropionate (DIPROLENE-AF) 0.05 % cream Apply 1 application topically daily as needed (itching).  ? carisoprodol (SOMA) 350 MG tablet TAKE 1 TABLET BY MOUTH THREE TIMES A DAY AS NEEDED FOR MUSCLE SPASMS  ? carvedilol (COREG) 6.25 MG tablet Take 1 tablet (6.25 mg total) by mouth 2 (two) times daily.  ? Cholecalciferol 100 MCG (4000 UT) CAPS 1 tab by mouth once daily  ? dapagliflozin propanediol (FARXIGA) 10 MG TABS tablet Take 1 tablet (10 mg total) by mouth daily before breakfast.  ? doxepin (SINEQUAN) 10 MG capsule TAKE 2 CAPSULES (20 MG TOTAL) BY MOUTH AT BEDTIME AS NEEDED.  ? Dupilumab (DUPIXENT) 300 MG/2ML SOPN Inject 300 mg into the skin every 14 (fourteen) days.  ? eplerenone (INSPRA) 50 MG tablet Take 1 tablet (50 mg total) by mouth daily.  ? Evolocumab (REPATHA SURECLICK) 387 MG/ML SOAJ INJECT 1 PEN INTO THE SKIN EVERY 14 (FOURTEEN) DAYS.  ? fluconazole (DIFLUCAN) 100 MG tablet Take 1 tablet (100 mg total) by mouth daily.  ? fluticasone (CUTIVATE) 0.05 % cream SMARTSIG:Topical 1-2 Times Daily PRN  ? fluticasone (FLONASE) 50 MCG/ACT nasal spray PLACE 2 SPRAYS INTO BOTH NOSTRILS DAILY AS NEEDED FOR ALLERGIES OR RHINITIS.  ? hydrOXYzine (ATARAX/VISTARIL) 10 MG tablet TAKE 1 TABLET BY MOUTH 3 TIMES DAILY  AS NEEDED FOR ITCHING.  ? ketoconazole (NIZORAL) 2 % cream APPLY 1 APPLICATION TOPICALLY DAILY  ? lidocaine (LIDODERM) 5 % APPLY 1 PATCH TO AFFECTED AREA FOR UP TO 12 HOURS . DO NOT USE MORE THAN ONE PATCH IN 24 HOURS.  ? losartan (COZAAR) 25 MG tablet TAKE 1/2 TABLET BY MOUTH EVERY DAY  ? metolazone (ZAROXOLYN) 2.5 MG tablet Take 1 tablet (2.5 mg total) by mouth as directed. With additional 40 meq of  potassium (Patient taking differently: Take 2.5 mg by mouth daily. With additional 40 meq of potassium)  ? omeprazole (PRILOSEC) 20 MG capsule TAKE 1 CAPSULE BY MOUTH TWICE A DAY  ? Potassium Chloride (KLOR-CON PO) Take 20 mcg by mouth daily.  ? sildenafil (VIAGRA) 50 MG tablet Take 1 tablet (50 mg total) by mouth as needed for erectile dysfunction.  ? tamsulosin (FLOMAX) 0.4 MG CAPS capsule Take 0.4 mg by mouth daily.  ? torsemide (DEMADEX) 20 MG tablet TAKE 3 TABLETS (60 MG TOTAL) BY MOUTH 2 (TWO) TIMES DAILY.  ? traZODone (DESYREL) 100 MG tablet 2 tab by mouth at bedtime as needed  ? triamcinolone (KENALOG) 0.1 % paste Use as directed in the mouth or throat 2 (two) times daily.  ? TUMS 500 MG chewable tablet Chew 500-2,000 mg by mouth every 4 (four) hours as needed for indigestion or heartburn.   ? venlafaxine XR (EFFEXOR-XR) 75 MG 24 hr capsule TAKE 1 CAPSULE EVERY DAY WITH BREAKFAST  ? zolpidem (AMBIEN) 10 MG tablet TAKE 1 TABLET BY MOUTH EVERY DAY AT BEDTIME AS NEEDED  ? ?No facility-administered encounter medications on file as of 12/28/2021.  ? ?Patient Active Problem List  ? Diagnosis Date Noted  ? Intertrigo 12/24/2021  ? Gluteal tendinitis of left buttock 12/19/2021  ? Unilateral primary osteoarthritis, left knee 10/17/2021  ? Status post total knee replacement, left 10/17/2021  ? Rib pain on right side 10/10/2021  ? History of colonic polyps 09/26/2021  ? Acute cough 07/21/2021  ? Bursitis of left hip 06/13/2021  ? Tooth fracture 06/11/2021  ? Pain and swelling of right lower leg 05/18/2021  ? Hemorrhagic shock ([place]) 05/05/2021  ? Gastrointestinal hemorrhage   ? Diverticulitis of colon   ? Preop exam for internal medicine 03/04/2021  ? B12 deficiency 03/04/2021  ? Vitamin D deficiency 03/01/2021  ? Myalgia 12/01/2020  ? Laceration of left leg 09/17/2020  ? Statin myopathy 08/02/2020  ? Rotator cuff arthropathy of both shoulders 02/23/2020  ? (HFpEF) heart failure with preserved ejection fraction ([place])  02/22/2020  ? ICD (implantable cardioverter-defibrillator) in place 12/27/2019  ? COVID-19 virus infection 12/09/2019  ? COVID-19 12/09/2019  ? Hypotension 12/09/2019  ? Upper respiratory infection 11/27/2019  ? COPD exacerbation ([place]) 11/27/2019  ? Elevated PSA 09/23/2019  ? Lip lesion 12/23/2018  ? Diabetic ulcer of left lower leg with necrosis of muscle ([place]) 12/05/2018  ? Chronic bursitis of left shoulder 07/01/2018  ? Pain due to total knee replacement ([place]) 03/31/2018  ? Rotator cuff arthropathy, left 02/13/2018  ? Right rotator cuff tear arthropathy 01/14/2018  ? Increased prostate specific antigen (PSA) velocity 09/26/2017  ? Trigger point of thoracic region 07/04/2017  ? Right lumbar radiculopathy 05/15/2017  ? Right leg swelling 05/09/2017  ? Glaucoma 04/18/2017  ? History of nasal polyposis 02/19/2017  ? Chronic rhinitis 02/19/2017  ? Microhematuria 12/04/2016  ? Hyperlipidemia 08/15/2016  ? Encounter for well adult exam with abnormal findings 11/29/2015  ? Persistent atrial fibrillation ([place]) 03/30/2015  ? Syncope  03/30/2015  ? Acute on chronic diastolic heart failure ([place]) 03/30/2015  ? Former tobacco use 02/25/2015  ? Type 2 diabetes mellitus ([place]) 02/25/2015  ? Atrial fibrillation ([place]) 02/25/2015  ? Anemia 02/23/2015  ? Thyrotoxicosis 02/23/2015  ? Hx of adenomatous colonic polyps 10/21/2014  ? De Quervain's tenosynovitis, right 04/30/2014  ? Osteoarthritis of right knee 02/19/2014  ? Encounter for therapeutic drug monitoring 11/17/2013  ? Thoracic degenerative disc disease 06/17/2013  ? Thoracic spondylosis without myelopathy 02/11/2013  ? Lumbosacral spondylosis without myelopathy 02/11/2013  ? Degenerative joint disease of knee, left 02/11/2013  ? COPD (chronic obstructive pulmonary disease) ([place]) 08/20/2012  ? Bundle branch block, right and extreme left anterior fascicular 05/05/2012  ? Nonischemic cardiomyopathy ([place]) 05/02/2012  ? Other primary cardiomyopathies 05/02/2012  ? Anticoagulated on  warfarin 03/21/2012  ? Spongiotic dermatitis 09/26/2011  ? Past myocardial infarction 04/19/2011  ? UNSPECIFIED URETHRAL STRICTURE 12/26/2010  ? Chronic pain syndrome 05/18/2010  ? Obstructive sleep apnea 01/06

## 2021-12-29 ENCOUNTER — Telehealth: Payer: Self-pay

## 2021-12-29 ENCOUNTER — Other Ambulatory Visit: Payer: Self-pay | Admitting: Internal Medicine

## 2021-12-29 ENCOUNTER — Ambulatory Visit (INDEPENDENT_AMBULATORY_CARE_PROVIDER_SITE_OTHER): Payer: Medicare HMO

## 2021-12-29 ENCOUNTER — Encounter: Payer: Self-pay | Admitting: Internal Medicine

## 2021-12-29 DIAGNOSIS — I5032 Chronic diastolic (congestive) heart failure: Secondary | ICD-10-CM

## 2021-12-29 DIAGNOSIS — Z95810 Presence of automatic (implantable) cardiac defibrillator: Secondary | ICD-10-CM

## 2021-12-29 NOTE — Telephone Encounter (Signed)
Remote ICM transmission received.  Attempted call to patient regarding ICM remote transmission and left detailed message per DPR.  Advised to return call for any fluid symptoms or questions. Next ICM remote transmission scheduled 01/29/2022.   ? ?

## 2021-12-29 NOTE — Progress Notes (Signed)
EPIC Encounter for ICM Monitoring ? ?Patient Name: [name] is a 66 y.o. male ?Date: 12/29/2021 ?Primary Care Physican: [name], MD ?Primary Cardiologist: [name] ?Electrophysiologist: [name] ?Bi-V Pacing: 99.4%    ?11/22/2021 Weight: 244.5 lbs ?12/26/2021 Weight: 245 lbs ?    ?Attempted call to patient and unable to reach.  Left detailed message per DPR regarding transmission. Transmission reviewed.    ?  ?Optivol thoracic impedance suggesting fluid levels returned to normal after taking extra Torsemide. ?  ?Prescribed:  ?Torsemide 20 mg to 3 tablets (60 mg total) by mouth twice a day.   ?Potassium 20 mEq take 1 tablets (20 mEq total) daily ?  ?Labs: ?12/20/2021 Creatinine 1.00, BUN 27, Potassium 4.3, Sodium 140, GFR 78.15 ?12/15/2021 Creatinine 1.05, BUN 23, Potassium 4.5, Sodium 137, GFR >60 ?10/18/2021 Creatinine 1.27, BUN 23, Potassium 5.0, Sodium 136, GFR >60 ?09/25/2021 Creatinine 1.13, BUN 20, Potassium 4.8, Sodium 138, GFR >60  ?09/15/2021 Creatinine 1.39, BUN 31, Potassium 4.3, Sodium 136, GFR 56  ?06/28/2021 Creatinine 1.14, BUN 22, Potassium 3.7, Sodium 137, GFR >60  ?A complete set of results can be found in Results Review. ?  ?Recommendations:  Left voice mail with ICM number and encouraged to call if experiencing any fluid symptoms. ?  ?Follow-up plan: ICM clinic phone appointment on 01/29/2022.   91 day device clinic remote transmission 01/24/2022. ?  ?EP/Cardiology Office Visits:   03/20/2022 with Dr [name].  04/05/2022 with Dr [name].    ?  ?Copy of ICM check sent to Dr. [name].   ?  ?3 month ICM trend: 12/29/2021. ? ? ? ?12-14 Month ICM trend:  ? ? ? ?[name], RN ?12/29/2021 ?10:37 AM ? ?

## 2022-01-03 ENCOUNTER — Ambulatory Visit (INDEPENDENT_AMBULATORY_CARE_PROVIDER_SITE_OTHER): Payer: Medicare HMO | Admitting: Internal Medicine

## 2022-01-03 ENCOUNTER — Other Ambulatory Visit: Payer: Self-pay

## 2022-01-03 ENCOUNTER — Encounter: Payer: Self-pay | Admitting: Internal Medicine

## 2022-01-03 DIAGNOSIS — N529 Male erectile dysfunction, unspecified: Secondary | ICD-10-CM

## 2022-01-03 MED ORDER — TADALAFIL 20 MG PO TABS: 10.0000 mg | ORAL_TABLET | ORAL | 11 refills | Status: DC | PRN

## 2022-01-03 NOTE — Assessment & Plan Note (Signed)
His cardiologist had prescribed viagra 50 mg which was not effective. He would like to try cialis and we discussed daily versus as needed strategy and he would like to try as needed only. Rx cialis 20 mg daily prn. Discussed risk of side effects. Not to take with nitro.  ?

## 2022-01-03 NOTE — Progress Notes (Signed)
? ?  Subjective:  ? ?Patient ID: [name], male    DOB: 15-Aug-1956, 66 y.o.   MRN: [id number] ? ?HPI ?The patient is a 66 YO man coming in for ED. ? ?Review of Systems  ?Constitutional:  Negative for activity change and appetite change.  ?Respiratory:  Negative for cough, chest tightness and shortness of breath.   ?Cardiovascular:  Negative for chest pain, palpitations and leg swelling.  ?Gastrointestinal:  Negative for abdominal distention, abdominal pain, constipation, diarrhea, nausea and vomiting.  ?Genitourinary:   ?     ED  ?Musculoskeletal: Negative.   ?Skin: Negative.   ?Neurological: Negative.   ? ?Objective:  ?Physical Exam ?Constitutional:   ?   Appearance: He is well-developed. He is obese.  ?HENT:  ?   Head: Normocephalic and atraumatic.  ?Cardiovascular:  ?   Rate and Rhythm: Normal rate and regular rhythm.  ?Pulmonary:  ?   Effort: Pulmonary effort is normal. No respiratory distress.  ?   Breath sounds: Normal breath sounds. No wheezing or rales.  ?Abdominal:  ?   General: There is no distension.  ?   Palpations: Abdomen is soft.  ?   Tenderness: There is no abdominal tenderness.  ?Musculoskeletal:  ?   Cervical back: Normal range of motion.  ?Skin: ?   General: Skin is warm and dry.  ?Neurological:  ?   Mental Status: He is alert and oriented to person, place, and time.  ?   Coordination: Coordination normal.  ?   Comments: Cane, slow gait  ? ? ?Vitals:  ? 01/03/22 0854  ?BP: 122/80  ?Pulse: 73  ?Resp: 18  ?SpO2: 98%  ?Weight: 243 lb 3.2 oz (110.3 kg)  ?Height: '5\' 10"'$  (1.778 m)  ? ? ?This visit occurred during the SARS-CoV-2 public health emergency.  Safety protocols were in place, including screening questions prior to the visit, additional usage of staff PPE, and extensive cleaning of exam room while observing appropriate contact time as indicated for disinfecting solutions.  ? ?Assessment & Plan:  ? ?

## 2022-01-03 NOTE — Patient Instructions (Signed)
We have sent in cialis to take as needed.  ? ? ?

## 2022-01-04 NOTE — Telephone Encounter (Signed)
Patient had appt with Dr. Sharlet Salina 01/03/22 ?

## 2022-01-05 ENCOUNTER — Other Ambulatory Visit: Payer: Self-pay | Admitting: Internal Medicine

## 2022-01-05 DIAGNOSIS — I4819 Other persistent atrial fibrillation: Secondary | ICD-10-CM

## 2022-01-05 DIAGNOSIS — I255 Ischemic cardiomyopathy: Secondary | ICD-10-CM

## 2022-01-05 DIAGNOSIS — I428 Other cardiomyopathies: Secondary | ICD-10-CM

## 2022-01-08 ENCOUNTER — Encounter: Payer: Self-pay | Admitting: Internal Medicine

## 2022-01-08 ENCOUNTER — Other Ambulatory Visit: Payer: Self-pay | Admitting: Internal Medicine

## 2022-01-10 ENCOUNTER — Encounter: Payer: Self-pay | Admitting: Internal Medicine

## 2022-01-10 NOTE — Telephone Encounter (Signed)
Patient states that he has enough pills to last him Monday when the pharmacy will fill his medication ?

## 2022-01-12 DIAGNOSIS — I509 Heart failure, unspecified: Secondary | ICD-10-CM | POA: Diagnosis not present

## 2022-01-12 DIAGNOSIS — I4891 Unspecified atrial fibrillation: Secondary | ICD-10-CM | POA: Diagnosis not present

## 2022-01-18 ENCOUNTER — Other Ambulatory Visit: Payer: Self-pay | Admitting: Internal Medicine

## 2022-01-18 DIAGNOSIS — J3089 Other allergic rhinitis: Secondary | ICD-10-CM

## 2022-01-18 DIAGNOSIS — Z8709 Personal history of other diseases of the respiratory system: Secondary | ICD-10-CM

## 2022-01-22 ENCOUNTER — Ambulatory Visit: Payer: Medicare HMO | Admitting: Podiatry

## 2022-01-24 ENCOUNTER — Ambulatory Visit (INDEPENDENT_AMBULATORY_CARE_PROVIDER_SITE_OTHER): Payer: Medicare HMO

## 2022-01-24 DIAGNOSIS — I4821 Permanent atrial fibrillation: Secondary | ICD-10-CM | POA: Diagnosis not present

## 2022-01-25 LAB — CUP PACEART REMOTE DEVICE CHECK
Battery Remaining Longevity: 27 mo
Battery Voltage: 2.94 V
Brady Statistic AP VP Percent: 0 %
Brady Statistic AP VS Percent: 0 %
Brady Statistic AS VP Percent: 0 %
Brady Statistic AS VS Percent: 0 %
Brady Statistic RA Percent Paced: 0 %
Brady Statistic RV Percent Paced: 99.88 %
Date Time Interrogation Session: 20230412012404
HighPow Impedance: 83 Ohm
Implantable Lead Implant Date: 20181205
Implantable Lead Implant Date: 20181205
Implantable Lead Location: 753858
Implantable Lead Location: 753860
Implantable Lead Model: 4398
Implantable Pulse Generator Implant Date: 20181205
Lead Channel Impedance Value: 1121 Ohm
Lead Channel Impedance Value: 1121 Ohm
Lead Channel Impedance Value: 1197 Ohm
Lead Channel Impedance Value: 1197 Ohm
Lead Channel Impedance Value: 256.5 Ohm
Lead Channel Impedance Value: 315.129
Lead Channel Impedance Value: 315.129
Lead Channel Impedance Value: 315.129
Lead Channel Impedance Value: 315.129
Lead Channel Impedance Value: 361 Ohm
Lead Channel Impedance Value: 4047 Ohm
Lead Channel Impedance Value: 418 Ohm
Lead Channel Impedance Value: 513 Ohm
Lead Channel Impedance Value: 513 Ohm
Lead Channel Impedance Value: 817 Ohm
Lead Channel Impedance Value: 817 Ohm
Lead Channel Impedance Value: 931 Ohm
Lead Channel Impedance Value: 931 Ohm
Lead Channel Pacing Threshold Amplitude: 0.25 V
Lead Channel Pacing Threshold Pulse Width: 0.4 ms
Lead Channel Sensing Intrinsic Amplitude: 8.875 mV
Lead Channel Sensing Intrinsic Amplitude: 8.875 mV
Lead Channel Setting Pacing Amplitude: 2.25 V
Lead Channel Setting Pacing Amplitude: 2.5 V
Lead Channel Setting Pacing Pulse Width: 0.4 ms
Lead Channel Setting Pacing Pulse Width: 0.6 ms
Lead Channel Setting Sensing Sensitivity: 0.3 mV

## 2022-01-29 ENCOUNTER — Ambulatory Visit (INDEPENDENT_AMBULATORY_CARE_PROVIDER_SITE_OTHER): Payer: Medicare HMO

## 2022-01-29 DIAGNOSIS — Z95810 Presence of automatic (implantable) cardiac defibrillator: Secondary | ICD-10-CM

## 2022-01-29 DIAGNOSIS — I5032 Chronic diastolic (congestive) heart failure: Secondary | ICD-10-CM

## 2022-01-30 ENCOUNTER — Other Ambulatory Visit: Payer: Self-pay | Admitting: Internal Medicine

## 2022-01-30 ENCOUNTER — Other Ambulatory Visit (HOSPITAL_COMMUNITY): Payer: Self-pay

## 2022-01-30 ENCOUNTER — Telehealth (HOSPITAL_COMMUNITY): Payer: Self-pay | Admitting: Pharmacy Technician

## 2022-01-30 DIAGNOSIS — I428 Other cardiomyopathies: Secondary | ICD-10-CM

## 2022-01-30 DIAGNOSIS — I4819 Other persistent atrial fibrillation: Secondary | ICD-10-CM

## 2022-01-30 DIAGNOSIS — I255 Ischemic cardiomyopathy: Secondary | ICD-10-CM

## 2022-01-30 NOTE — Telephone Encounter (Signed)
,  Advanced Heart Failure Patient Advocate Encounter ? ?Prior Authorization for Eplerenone has been submitted and approved.   ? ?PA# [id number] ?Effective dates: 10/15/21 through 10/14/22 ? ?Patients co-pay is $4.15 (90 days) ? ?Called and spoke with the patient.  ? ?Charlann Boxer, CPhT ? ?

## 2022-02-02 ENCOUNTER — Telehealth: Payer: Self-pay

## 2022-02-02 NOTE — Progress Notes (Signed)
EPIC Encounter for ICM Monitoring ? ?Patient Name: [name] is a 66 y.o. male ?Date: 02/02/2022 ?Primary Care Physican: [name], MD ?Primary Cardiologist: [name] ?Electrophysiologist: [name] ?Bi-V Pacing: 99.9%    ?11/22/2021 Weight: 244.5 lbs ?12/26/2021 Weight: 245 lbs ?    ?Attempted call to patient and unable to reach.  Left detailed message per DPR regarding transmission. Transmission reviewed.  ?  ?Optivol thoracic impedance suggesting normal fluid levels but was suggesting was possible fluid accumulation from 4/5-4/9. ?  ?Prescribed:  ?Torsemide 20 mg to 3 tablets (60 mg total) by mouth twice a day.   ?Potassium 20 mEq take 1 tablets (20 mEq total) daily ?  ?Labs: ?12/20/2021 Creatinine 1.00, BUN 27, Potassium 4.3, Sodium 140, GFR 78.15 ?12/15/2021 Creatinine 1.05, BUN 23, Potassium 4.5, Sodium 137, GFR >60 ?10/18/2021 Creatinine 1.27, BUN 23, Potassium 5.0, Sodium 136, GFR >60 ?09/25/2021 Creatinine 1.13, BUN 20, Potassium 4.8, Sodium 138, GFR >60  ?09/15/2021 Creatinine 1.39, BUN 31, Potassium 4.3, Sodium 136, GFR 56  ?06/28/2021 Creatinine 1.14, BUN 22, Potassium 3.7, Sodium 137, GFR >60  ?A complete set of results can be found in Results Review. ?  ?Recommendations:  Left voice mail with ICM number and encouraged to call if experiencing any fluid symptoms. ?  ?Follow-up plan: ICM clinic phone appointment on 03/05/2022.   91 day device clinic remote transmission 04/25/2022. ?  ?EP/Cardiology Office Visits:   03/20/2022 with Dr [name].  04/05/2022 with Dr [name].    ?  ?Copy of ICM check sent to Dr. [name].   ? ?3 month ICM trend: 01/29/2022. ? ? ? ?12-14 Month ICM trend:  ? ? ? ?[name], RN ?02/02/2022 ?12:22 PM ? ?

## 2022-02-02 NOTE — Telephone Encounter (Signed)
Remote ICM transmission received.  Attempted call to patient regarding ICM remote transmission and left detailed message per DPR.  Advised to return call for any fluid symptoms or questions. Next ICM remote transmission scheduled 03/05/2022.   ? ?

## 2022-02-03 ENCOUNTER — Other Ambulatory Visit: Payer: Self-pay | Admitting: Internal Medicine

## 2022-02-08 DIAGNOSIS — H40013 Open angle with borderline findings, low risk, bilateral: Secondary | ICD-10-CM | POA: Diagnosis not present

## 2022-02-08 DIAGNOSIS — H2513 Age-related nuclear cataract, bilateral: Secondary | ICD-10-CM | POA: Diagnosis not present

## 2022-02-09 NOTE — Progress Notes (Signed)
Remote ICD transmission.   

## 2022-02-13 DIAGNOSIS — H40013 Open angle with borderline findings, low risk, bilateral: Secondary | ICD-10-CM | POA: Diagnosis not present

## 2022-02-13 DIAGNOSIS — H2511 Age-related nuclear cataract, right eye: Secondary | ICD-10-CM | POA: Diagnosis not present

## 2022-02-15 ENCOUNTER — Ambulatory Visit: Payer: Medicare HMO | Admitting: Podiatry

## 2022-02-15 ENCOUNTER — Ambulatory Visit (INDEPENDENT_AMBULATORY_CARE_PROVIDER_SITE_OTHER): Payer: Medicare HMO

## 2022-02-15 DIAGNOSIS — M79674 Pain in right toe(s): Secondary | ICD-10-CM

## 2022-02-15 DIAGNOSIS — M79675 Pain in left toe(s): Secondary | ICD-10-CM

## 2022-02-15 DIAGNOSIS — E119 Type 2 diabetes mellitus without complications: Secondary | ICD-10-CM

## 2022-02-15 DIAGNOSIS — B351 Tinea unguium: Secondary | ICD-10-CM

## 2022-02-15 DIAGNOSIS — M21611 Bunion of right foot: Secondary | ICD-10-CM

## 2022-02-15 DIAGNOSIS — M2011 Hallux valgus (acquired), right foot: Secondary | ICD-10-CM | POA: Diagnosis not present

## 2022-02-16 NOTE — Progress Notes (Signed)
?Charlann Boxer D.O. ?[facility] ?[place] ?Phone: [phone] ?Subjective:   ?I, [name], am serving as a scribe for Dr. Hulan Saas. ? ?This visit occurred during the SARS-CoV-2 public health emergency.  Safety protocols were in place, including screening questions prior to the visit, additional usage of staff PPE, and extensive cleaning of exam room while observing appropriate contact time as indicated for disinfecting solutions.  ?I'm seeing this patient by the request  of:  [name], MD ? ?CC: Bilateral shoulder pain ? ?[id number]  ?12/19/2021 ?Patient given injection and tolerated the procedure well, discussed icing regimen and home exercises, increase activity slowly.  Likely more secondary to compensation from patient's knee replacement.  Hopefully patient will respond well to the injection and follow-up with me again in 6 weeks. ? ?Rotator cuff arthropathy bilaterally.  Bilateral injections given again today.  This is a chronic problem.  Patient does know at some point may need a possible replacement but wants to hold off at this moment.  Doing better since the knee replacement.  Patient will follow-up with me again 6 to 8 weeks. ? ?Updated 02/20/2022 ?[name] is a 66 y.o. male coming in with complaint of L hip and  B shoulder pain ?Known rotator cuff arthropathy bilaterally.  Patient has been responding relatively well to injections intermittently.  Patient still wants to avoid any type of surgical intervention especially with him recently still having his knee replacement. Pain increased one week ago.  ? ? ? ?  ? ?Past Medical History:  ?Diagnosis Date  ? AICD (automatic cardioverter/defibrillator) present   ? Anemia   ? supposed to be taking Vit B but doesn't  ? ANXIETY   ? takes Xanax nightly  ? Arthritis   ? Asthma   ? Albuterol prn and Advair daily;also takes Prednisone daily  ? Atrial fibrillation ([place]) 09/06/2015  ? Cardiomyopathy ([place])   ?  a. EF 25% TEE July 2013; b. EF normalized 2015;  c. 03/2015 Echo: EF 40-45%, difrf HK, PASP 38 mmHg, Mild MR, sev LAE/RAE.  ? CHF (congestive heart failure) ([place])   ? Chronic constipation   ? takes OTC stool softener  ? COPD (chronic obstructive pulmonary disease) ([place])   ? "one dr says COPD; one dr says emphysema" (09/18/2017)  ? DEPRESSION   ? takes Zoloft and Doxepin daily  ? Diverticulitis   ? DYSKINESIA, ESOPHAGUS   ? Dysrhythmia   ? Atrial Fibrilation  ? Emphysema of lung ([place])   ? "one dr says COPD; one dr says emphysema" (09/18/2017)  ? Essential hypertension   ?    ? FIBROMYALGIA   ? GERD (gastroesophageal reflux disease)   ?    ? Glaucoma   ? HYPERLIPIDEMIA   ? a. Intolerant to statins.  ? INSOMNIA   ? takes Ambien nightly  ? Myocardial infarction [facility])   ? a. 2012 Myoview notable for prior infarct;  b. 03/2015 Lexiscan CL: EF 37%, diff HK, small area of inferior infarct from apex to base-->Med Rx.  ? Myocardial infarction [facility])   ? O2 dependent   ? "2.5L q hs & prn" (09/18/2017)  ? Paroxysmal atrial fibrillation (HCC)   ? a. CHA2DS2VASc = 3--> takes Coumadin;  b. 03/15/2015 Successful TEE/DCCV;  c. 03/2015 recurrent afib, Amio d/c'd in setting of hyperthyroidism.  ? Peripheral neuropathy   ? Pneumonia 12/2016  ? Rash and other nonspecific skin eruption 04/12/2009  ? no cause found saw  dermatologists x 2 and allergist  ? SLEEP APNEA, OBSTRUCTIVE   ? a. doesn't use CPAP  ? Syncope   ? a. 03/2015 s/p MDT LINQ.  ? Type II diabetes mellitus ([place])   ?    ? ?Past Surgical History:  ?Procedure Laterality Date  ? ACNE CYST REMOVAL    ? 2 on back   ? AV NODE ABLATION N/A 10/25/2017  ? Procedure: AV NODE ABLATION;  Surgeon: Deboraha Sprang, MD;  Location: [place] [facility];  Service: Cardiovascular;  Laterality: N/A;  ? BIV ICD INSERTION CRT-D N/A 09/18/2017  ? Procedure: BIV ICD INSERTION CRT-D;  Surgeon: Deboraha Sprang, MD;  Location: [place] [facility];  Service: Cardiovascular;  Laterality: N/A;  ? CARDIAC  CATHETERIZATION N/A 03/21/2016  ? Procedure: Right/Left Heart Cath and Coronary Angiography;  Surgeon: Larey Dresser, MD;  Location: [place] [facility];  Service: Cardiovascular;  Laterality: N/A;  ? CARDIOVERSION  04/18/2012  ? Procedure: CARDIOVERSION;  Surgeon: Fay Records, MD;  Location: [place];  Service: Cardiovascular;  Laterality: N/A;  ? CARDIOVERSION  04/25/2012  ? Procedure: CARDIOVERSION;  Surgeon: Thayer Headings, MD;  Location: [place];  Service: Cardiovascular;  Laterality: N/A;  ? CARDIOVERSION  04/25/2012  ? Procedure: CARDIOVERSION;  Surgeon: Fay Records, MD;  Location: [place];  Service: Cardiovascular;  Laterality: N/A;  ? CARDIOVERSION  05/09/2012  ? Procedure: CARDIOVERSION;  Surgeon: Sherren Mocha, MD;  Location: [facility] OR;  Service: Cardiovascular;  Laterality: N/A;  changed from crenshaw to cooper by trish/leone-endo  ? CARDIOVERSION N/A 03/15/2015  ? Procedure: CARDIOVERSION;  Surgeon: Thayer Headings, MD;  Location: [facility] ENDOSCOPY;  Service: Cardiovascular;  Laterality: N/A;  ? COLONOSCOPY    ? COLONOSCOPY WITH PROPOFOL N/A 10/21/2014  ? Procedure: COLONOSCOPY WITH PROPOFOL;  Surgeon: Ladene Artist, MD;  Location: [facility] ENDOSCOPY;  Service: Endoscopy;  Laterality: N/A;  ? EP IMPLANTABLE DEVICE N/A 04/06/2015  ? Procedure: Loop Recorder Insertion;  Surgeon: Evans Lance, MD;  Location: [place] CV LAB;  Service: Cardiovascular;  Laterality: N/A;  ? ESOPHAGOGASTRODUODENOSCOPY    ? JOINT REPLACEMENT    ? LOOP RECORDER REMOVAL N/A 09/18/2017  ? Procedure: LOOP RECORDER REMOVAL;  Surgeon: Deboraha Sprang, MD;  Location: [place] CV [facility];  Service: Cardiovascular;  Laterality: N/A;  ? RIGHT/LEFT HEART CATH AND CORONARY ANGIOGRAPHY N/A 01/28/2017  ? Procedure: Right/Left Heart Cath and Coronary Angiography;  Surgeon: Larey Dresser, MD;  Location: [place] [facility];  Service: Cardiovascular;  Laterality: N/A;  ? TEE WITHOUT CARDIOVERSION  04/25/2012  ? Procedure: TRANSESOPHAGEAL ECHOCARDIOGRAM (TEE);   Surgeon: Thayer Headings, MD;  Location: [place];  Service: Cardiovascular;  Laterality: N/A;  ? TEE WITHOUT CARDIOVERSION N/A 03/15/2015  ? Procedure: TRANSESOPHAGEAL ECHOCARDIOGRAM (TEE);  Surgeon: Thayer Headings, MD;  Location: [place];  Service: Cardiovascular;  Laterality: N/A;  ? TONSILLECTOMY AND ADENOIDECTOMY    ? TOTAL KNEE ARTHROPLASTY Right 06/15/2014  ? Procedure: TOTAL KNEE ARTHROPLASTY;  Surgeon: Renette Butters, MD;  Location: [place];  Service: Orthopedics;  Laterality: Right;  ? TOTAL KNEE ARTHROPLASTY Left 10/17/2021  ? Procedure: Left TOTAL KNEE ARTHROPLASTY;  Surgeon: Mcarthur Rossetti, MD;  Location: [place];  Service: Orthopedics;  Laterality: Left;  ? ?Social History  ? ?Socioeconomic History  ? Marital status: Divorced  ?  Spouse name: Not on file  ? Number of children: 2  ? Years of education: Not on file  ? Highest education  level: Not on file  ?Occupational History  ? Occupation: retired/disabled. prev worked in Therapist, sports.  ?  Employer: DISABLED  ?Tobacco Use  ? Smoking status: Former  ?  Packs/day: 2.00  ?  Years: 30.00  ?  Pack years: 60.00  ?  Types: Cigarettes  ?  Quit date: 10/16/2007  ?  Years since quitting: 14.3  ? Smokeless tobacco: Never  ?Vaping Use  ? Vaping Use: Never used  ?Substance and Sexual Activity  ? Alcohol use: No  ? Drug use: No  ? Sexual activity: Not Currently  ?Other Topics Concern  ? Not on file  ?Social History Narrative  ? Lives alone.  ? ?Social Determinants of Health  ? ?Financial Resource Strain: Low Risk   ? Difficulty of Paying Living Expenses: Not hard at all  ?Food Insecurity: No Food Insecurity  ? Worried About Charity fundraiser in the Last Year: Never true  ? Ran Out of Food in the Last Year: Never true  ?Transportation Needs: No Transportation Needs  ? Lack of Transportation (Medical): No  ? Lack of Transportation (Non-Medical): No  ?Physical Activity: Inactive  ? Days of Exercise per Week: 0 days  ? Minutes of Exercise per Session: 0  min  ?Stress: No Stress Concern Present  ? Feeling of Stress : Not at all  ?Social Connections: Moderately Integrated  ? Frequency of Communication with Friends and Family: More than three times a week  ? Gardiner Barefoot

## 2022-02-19 ENCOUNTER — Encounter: Payer: Self-pay | Admitting: Internal Medicine

## 2022-02-19 ENCOUNTER — Other Ambulatory Visit: Payer: Self-pay | Admitting: Internal Medicine

## 2022-02-19 NOTE — Telephone Encounter (Signed)
Per chart med had not been refilled by you. please advise.Marland KitchenJohny Chess ?

## 2022-02-19 NOTE — Telephone Encounter (Signed)
[place] for routine refill please ?

## 2022-02-20 ENCOUNTER — Ambulatory Visit: Payer: Self-pay

## 2022-02-20 ENCOUNTER — Ambulatory Visit: Payer: Medicare HMO | Admitting: Family Medicine

## 2022-02-20 ENCOUNTER — Encounter: Payer: Self-pay | Admitting: Family Medicine

## 2022-02-20 VITALS — BP 100/70 | HR 75 | Ht 70.0 in | Wt 242.0 lb

## 2022-02-20 DIAGNOSIS — M7602 Gluteal tendinitis, left hip: Secondary | ICD-10-CM

## 2022-02-20 DIAGNOSIS — M12811 Other specific arthropathies, not elsewhere classified, right shoulder: Secondary | ICD-10-CM | POA: Diagnosis not present

## 2022-02-20 DIAGNOSIS — M25512 Pain in left shoulder: Secondary | ICD-10-CM | POA: Diagnosis not present

## 2022-02-20 DIAGNOSIS — M12812 Other specific arthropathies, not elsewhere classified, left shoulder: Secondary | ICD-10-CM | POA: Diagnosis not present

## 2022-02-20 DIAGNOSIS — M25511 Pain in right shoulder: Secondary | ICD-10-CM

## 2022-02-20 NOTE — Patient Instructions (Signed)
See me in 8-10 weeks ?Check with surgeon about cateracts and injections ?

## 2022-02-20 NOTE — Progress Notes (Signed)
?  Subjective:  ?Patient ID: [name], male    DOB: [birth year],  MRN: [id number] ? ?Chief Complaint  ?Patient presents with  ? Bunions  ?  Right foot, surgery planning visit  ? ? ? ?66 y.o. male returns with the above complaint. History confirmed with patient.  His nails are thickened elongated and causing pain again.  He is here for his surgery planning visit ? ?Objective:  ?Physical Exam: ?warm, good capillary refill, no trophic changes or ulcerative lesions, normal sensory exam, +2 DP and PT reduced bilateral and venous stasis dermatitis noted.  Moderate to severe hallux abductovalgus with hammertoe contractures 2 through 5 bilaterally, right worse than left.  Thickened elongated dystrophic mycotic nails x10 with pincer nail deformity. ? ?Summary:  ?Right: Resting right ankle-brachial index indicates noncompressible right  ?lower extremity arteries. The right toe-brachial index is normal. ABIs are  ?unreliable.  ? ?Left: Resting left ankle-brachial index is within normal range. No  ?evidence of significant left lower extremity arterial disease. The left  ?toe-brachial index is normal.  ? ? ?   ?*See table(s) above for measurements and observations.  ?   ? ? ? ? ?Radiographs: ?X-ray of the right foot: Moderate hallux abductovalgus with the presence of metatarsus adductus deformity and pes planus, no increased change in alignment since last radiographs ?Assessment:  ? ?1. Hallux valgus with bunions, right   ?2. Pain due to onychomycosis of toenails of both feet   ?3. Type 2 diabetes mellitus without complication, without long-term current use of insulin ([place])   ? ? ? ? ?Plan:  ?Patient was evaluated and treated and all questions answered. ? ?Patient educated on diabetes. Discussed proper diabetic foot care and discussed risks and complications of disease. Educated patient in depth on reasons to return to the office immediately should he/she discover anything concerning or new on the feet. All questions answered.  Discussed proper shoes as well. ? ?Discussed the etiology and treatment options for the condition in detail with the patient. Educated patient on the topical and oral treatment options for mycotic nails. Recommended debridement of the nails today. Sharp and mechanical debridement performed of all painful and mycotic nails today. Nails debrided in length and thickness using a nail nipper to level of comfort. Discussed treatment options including appropriate shoe gear. Follow up as needed for painful nails.  Ingrowing corners of hallux nails debrided and slant back fashion ? ?We can discuss correction of his bunions through minimally invasive approach with distal metatarsal and Aiken osteotomies.  Radiographs were taken today and reviewed with him.  We really discussed the risk benefits and potential complications of surgery.  He has upcoming cataract surgery would like to hold off on bunion surgery for now until after cataract surgery.  I will see him back for his regular visit and we will discuss further. ?No follow-ups on file.  ?

## 2022-02-20 NOTE — Assessment & Plan Note (Signed)
Discussed with patient at great length, discussed icing regimen and home exercises, discussed which activities to do and which ones to avoid, increase activity slowly.  Patient knows that if he does have near end-stage arthritic changes noted bilaterally.  Discussed with patient about the possibility need for surgical intervention but still with patient having the knee replacement wants to avoid that completely. ?

## 2022-02-20 NOTE — Assessment & Plan Note (Signed)
The patient responded well to the injection.  Discussed icing regimen and home exercises, which activities to do which ones to avoid, encouraged him to continue to work on weight loss.  Still think some of it is secondary to patient rehabbing for his knee replacement.  Follow-up again in 8-10 weeks. ?

## 2022-02-21 ENCOUNTER — Telehealth: Payer: Self-pay | Admitting: Internal Medicine

## 2022-02-21 MED ORDER — TAMSULOSIN HCL 0.4 MG PO CAPS: 0.4000 mg | ORAL_CAPSULE | Freq: Every day | ORAL | 0 refills | Status: DC

## 2022-02-21 NOTE — Telephone Encounter (Signed)
PT calls in need of a refill on their medication. PT would like a refill for their tamsulosin (FLOMAX) 0.4 MG caps [facility] [address] [facility] possible! ? ?CB: [phone] ?

## 2022-02-21 NOTE — Telephone Encounter (Signed)
Already done.. Pt sent mychart msg as well, and was contacted it was done,.../lmb ?

## 2022-02-21 NOTE — Addendum Note (Signed)
Addended by: Terence Lux A on: 02/21/2022 11:36 AM ? ? Modules accepted: Orders ? ?

## 2022-02-26 DIAGNOSIS — Z950 Presence of cardiac pacemaker: Secondary | ICD-10-CM | POA: Diagnosis not present

## 2022-02-26 DIAGNOSIS — K219 Gastro-esophageal reflux disease without esophagitis: Secondary | ICD-10-CM | POA: Diagnosis not present

## 2022-02-26 DIAGNOSIS — Z6834 Body mass index (BMI) 34.0-34.9, adult: Secondary | ICD-10-CM | POA: Diagnosis not present

## 2022-02-26 DIAGNOSIS — E669 Obesity, unspecified: Secondary | ICD-10-CM | POA: Diagnosis not present

## 2022-02-26 DIAGNOSIS — I4891 Unspecified atrial fibrillation: Secondary | ICD-10-CM | POA: Diagnosis not present

## 2022-02-26 DIAGNOSIS — H2181 Floppy iris syndrome: Secondary | ICD-10-CM | POA: Diagnosis not present

## 2022-02-26 DIAGNOSIS — Z87891 Personal history of nicotine dependence: Secondary | ICD-10-CM | POA: Diagnosis not present

## 2022-02-26 DIAGNOSIS — Z7901 Long term (current) use of anticoagulants: Secondary | ICD-10-CM | POA: Diagnosis not present

## 2022-02-26 DIAGNOSIS — J449 Chronic obstructive pulmonary disease, unspecified: Secondary | ICD-10-CM | POA: Diagnosis not present

## 2022-02-26 DIAGNOSIS — I509 Heart failure, unspecified: Secondary | ICD-10-CM | POA: Diagnosis not present

## 2022-02-26 DIAGNOSIS — I10 Essential (primary) hypertension: Secondary | ICD-10-CM | POA: Diagnosis not present

## 2022-02-26 DIAGNOSIS — H2511 Age-related nuclear cataract, right eye: Secondary | ICD-10-CM | POA: Diagnosis not present

## 2022-02-26 DIAGNOSIS — I2510 Atherosclerotic heart disease of native coronary artery without angina pectoris: Secondary | ICD-10-CM | POA: Diagnosis not present

## 2022-02-26 DIAGNOSIS — G4730 Sleep apnea, unspecified: Secondary | ICD-10-CM | POA: Diagnosis not present

## 2022-03-01 ENCOUNTER — Other Ambulatory Visit: Payer: Self-pay | Admitting: Internal Medicine

## 2022-03-05 ENCOUNTER — Ambulatory Visit (INDEPENDENT_AMBULATORY_CARE_PROVIDER_SITE_OTHER): Payer: Medicare HMO

## 2022-03-05 DIAGNOSIS — Z95810 Presence of automatic (implantable) cardiac defibrillator: Secondary | ICD-10-CM

## 2022-03-05 DIAGNOSIS — I5032 Chronic diastolic (congestive) heart failure: Secondary | ICD-10-CM | POA: Diagnosis not present

## 2022-03-07 DIAGNOSIS — H2512 Age-related nuclear cataract, left eye: Secondary | ICD-10-CM | POA: Insufficient documentation

## 2022-03-08 ENCOUNTER — Telehealth: Payer: Self-pay

## 2022-03-08 ENCOUNTER — Other Ambulatory Visit: Payer: Self-pay | Admitting: Internal Medicine

## 2022-03-08 NOTE — Progress Notes (Signed)
EPIC Encounter for ICM Monitoring  Patient Name: [name] is a 66 y.o. male Date: 03/08/2022 Primary Care Physican: [name], MD Primary Cardiologist: [name] Electrophysiologist: Vergie Living Pacing: 99.8%    11/22/2021 Weight: 244.5 lbs 12/26/2021 Weight: 245 lbs     Attempted call to patient and unable to reach.  Left detailed message per DPR regarding transmission. Transmission reviewed.    Optivol thoracic impedance suggesting intermittent days with possible fluid accumulation in the last month.   Prescribed:  Torsemide 20 mg to 3 tablets (60 mg total) by mouth twice a day.   Potassium 20 mEq take 1 tablets (20 mEq total) daily   Labs: 12/20/2021 Creatinine 1.00, BUN 27, Potassium 4.3, Sodium 140, GFR 78.15 12/15/2021 Creatinine 1.05, BUN 23, Potassium 4.5, Sodium 137, GFR >60 10/18/2021 Creatinine 1.27, BUN 23, Potassium 5.0, Sodium 136, GFR >60 09/25/2021 Creatinine 1.13, BUN 20, Potassium 4.8, Sodium 138, GFR >60  09/15/2021 Creatinine 1.39, BUN 31, Potassium 4.3, Sodium 136, GFR 56  06/28/2021 Creatinine 1.14, BUN 22, Potassium 3.7, Sodium 137, GFR >60  A complete set of results can be found in Results Review.   Recommendations: Left voice mail with ICM number and encouraged to call if experiencing any fluid symptoms.   Follow-up plan: ICM clinic phone appointment on 04/09/2022.   91 day device clinic remote transmission 04/25/2022.   EP/Cardiology Office Visits:   03/20/2022 with Dr [name].  04/05/2022 with Dr [name].      Copy of ICM check sent to Dr. [name].    3 month ICM trend: 03/05/2022.    12-14 Month ICM trend:     [name], RN 03/08/2022 8:59 AM

## 2022-03-08 NOTE — Telephone Encounter (Signed)
Remote ICM transmission received.  Attempted call to patient regarding ICM remote transmission and left detailed message per DPR.  Advised to return call for any fluid symptoms or questions. Next ICM remote transmission scheduled 04/09/2022.

## 2022-03-13 DIAGNOSIS — I509 Heart failure, unspecified: Secondary | ICD-10-CM | POA: Diagnosis not present

## 2022-03-13 DIAGNOSIS — Z950 Presence of cardiac pacemaker: Secondary | ICD-10-CM | POA: Diagnosis not present

## 2022-03-13 DIAGNOSIS — H2512 Age-related nuclear cataract, left eye: Secondary | ICD-10-CM | POA: Diagnosis not present

## 2022-03-13 DIAGNOSIS — H40013 Open angle with borderline findings, low risk, bilateral: Secondary | ICD-10-CM | POA: Diagnosis not present

## 2022-03-13 DIAGNOSIS — Z95810 Presence of automatic (implantable) cardiac defibrillator: Secondary | ICD-10-CM | POA: Diagnosis not present

## 2022-03-13 DIAGNOSIS — G4730 Sleep apnea, unspecified: Secondary | ICD-10-CM | POA: Diagnosis not present

## 2022-03-13 DIAGNOSIS — I4891 Unspecified atrial fibrillation: Secondary | ICD-10-CM | POA: Diagnosis not present

## 2022-03-13 DIAGNOSIS — E669 Obesity, unspecified: Secondary | ICD-10-CM | POA: Diagnosis not present

## 2022-03-13 DIAGNOSIS — I2510 Atherosclerotic heart disease of native coronary artery without angina pectoris: Secondary | ICD-10-CM | POA: Diagnosis not present

## 2022-03-13 DIAGNOSIS — J449 Chronic obstructive pulmonary disease, unspecified: Secondary | ICD-10-CM | POA: Diagnosis not present

## 2022-03-13 DIAGNOSIS — H5703 Miosis: Secondary | ICD-10-CM | POA: Diagnosis not present

## 2022-03-13 DIAGNOSIS — I110 Hypertensive heart disease with heart failure: Secondary | ICD-10-CM | POA: Diagnosis not present

## 2022-03-13 DIAGNOSIS — K219 Gastro-esophageal reflux disease without esophagitis: Secondary | ICD-10-CM | POA: Diagnosis not present

## 2022-03-13 DIAGNOSIS — Z87891 Personal history of nicotine dependence: Secondary | ICD-10-CM | POA: Diagnosis not present

## 2022-03-14 ENCOUNTER — Other Ambulatory Visit: Payer: Self-pay | Admitting: Internal Medicine

## 2022-03-20 ENCOUNTER — Encounter (HOSPITAL_COMMUNITY): Payer: Self-pay | Admitting: Cardiology

## 2022-03-20 ENCOUNTER — Ambulatory Visit (HOSPITAL_COMMUNITY)
Admission: RE | Admit: 2022-03-20 | Discharge: 2022-03-20 | Disposition: A | Discharge status: Home / Self Care | Payer: Medicare HMO | Source: Ambulatory Visit | Attending: Cardiology | Admitting: Cardiology

## 2022-03-20 VITALS — BP 120/78 | HR 70 | Wt 247.4 lb

## 2022-03-20 DIAGNOSIS — I5022 Chronic systolic (congestive) heart failure: Secondary | ICD-10-CM | POA: Insufficient documentation

## 2022-03-20 DIAGNOSIS — I4891 Unspecified atrial fibrillation: Secondary | ICD-10-CM | POA: Diagnosis not present

## 2022-03-20 DIAGNOSIS — G4733 Obstructive sleep apnea (adult) (pediatric): Secondary | ICD-10-CM | POA: Diagnosis not present

## 2022-03-20 DIAGNOSIS — R7989 Other specified abnormal findings of blood chemistry: Secondary | ICD-10-CM | POA: Diagnosis not present

## 2022-03-20 DIAGNOSIS — J449 Chronic obstructive pulmonary disease, unspecified: Secondary | ICD-10-CM | POA: Diagnosis not present

## 2022-03-20 DIAGNOSIS — I2510 Atherosclerotic heart disease of native coronary artery without angina pectoris: Secondary | ICD-10-CM | POA: Diagnosis not present

## 2022-03-20 DIAGNOSIS — Z9981 Dependence on supplemental oxygen: Secondary | ICD-10-CM | POA: Diagnosis not present

## 2022-03-20 DIAGNOSIS — Z7901 Long term (current) use of anticoagulants: Secondary | ICD-10-CM | POA: Insufficient documentation

## 2022-03-20 DIAGNOSIS — I442 Atrioventricular block, complete: Secondary | ICD-10-CM | POA: Insufficient documentation

## 2022-03-20 DIAGNOSIS — I5032 Chronic diastolic (congestive) heart failure: Secondary | ICD-10-CM

## 2022-03-20 DIAGNOSIS — I428 Other cardiomyopathies: Secondary | ICD-10-CM | POA: Diagnosis not present

## 2022-03-20 LAB — BASIC METABOLIC PANEL
Anion gap: 9 (ref 5–15)
BUN: 36 mg/dL — ABNORMAL HIGH (ref 8–23)
CO2: 24 mmol/L (ref 22–32)
Calcium: 8.9 mg/dL (ref 8.9–10.3)
Chloride: 102 mmol/L (ref 98–111)
Creatinine, Ser: 1.51 mg/dL — ABNORMAL HIGH (ref 0.61–1.24)
GFR, Estimated: 50 mL/min — ABNORMAL LOW (ref >60)
Glucose, Bld: 90 mg/dL (ref 70–99)
Potassium: 4.5 mmol/L (ref 3.5–5.1)
Sodium: 135 mmol/L (ref 135–145)

## 2022-03-20 LAB — CBC
HCT: 40.4 % (ref 39.0–52.0)
Hemoglobin: 13.4 g/dL (ref 13.0–17.0)
MCH: 33.2 pg (ref 26.0–34.0)
MCHC: 33.2 g/dL (ref 30.0–36.0)
MCV: 100 fL (ref 80.0–100.0)
Platelets: 188 10*3/uL (ref 150–400)
RBC: 4.04 MIL/uL — ABNORMAL LOW (ref 4.22–5.81)
RDW: 13.9 % (ref 11.5–15.5)
WBC: 7.1 10*3/uL (ref 4.0–10.5)
nRBC: 0 % (ref 0.0–0.2)

## 2022-03-20 MED ORDER — LOSARTAN POTASSIUM 25 MG PO TABS: 25.0000 mg | ORAL_TABLET | Freq: Every day | ORAL | 11 refills | Status: DC

## 2022-03-20 NOTE — Patient Instructions (Signed)
Increase Losartan to '25mg'$  daily.  Labs done today, your results will be available in MyChart, we will contact you for abnormal readings.  Repeat blood work in 10 days.  Your physician has requested that you have an echocardiogram. Echocardiography is a painless test that uses sound waves to create images of your heart. It provides your doctor with information about the size and shape of your heart and how well your heart's chambers and valves are working. This procedure takes approximately one hour. There are no restrictions for this procedure.  Your physician recommends that you schedule a follow-up appointment in: 4 months (October 2023)  ** PLEASE CALL the office in [place] to arrange your follow up appointment **  If you have any questions or concerns before your next appointment please send [place] a message through [place] or call our office at [phone].    TO LEAVE A MESSAGE FOR THE NURSE SELECT OPTION 2, PLEASE LEAVE A MESSAGE INCLUDING: YOUR NAME DATE OF BIRTH CALL BACK NUMBER REASON FOR CALL**this is important as we prioritize the call backs  YOU WILL RECEIVE A CALL BACK THE SAME DAY AS LONG AS YOU CALL BEFORE 4:00 PM  At the [facility], you and your health needs are our priority. As part of our continuing mission to provide you with exceptional heart care, we have created designated Provider Care Teams. These Care Teams include your primary Cardiologist (physician) and Advanced Practice Providers (APPs- Physician Assistants and Nurse Practitioners) who all work together to provide you with the care you need, when you need it.   You may see any of the following providers on your designated Care Team at your next follow up: Dr Glori Bickers Dr Haynes Kerns, NP Lyda Jester, [place] [facility] [place], [place] Audry Riles, PharmD   Please be sure to bring in all your medications bottles to every appointment.

## 2022-03-21 NOTE — Progress Notes (Signed)
ID:  [name], DOB [birth year], MRN [id number]  Provider location: [facility] Type of Visit: Established patient  PCP:  [name], MD  Cardiologist:  Dr. [name]   History of Present Illness: [name] is a 66 y.o. male who has history of COPD on home oxygen, permanent atrial fibrillation, and chronic systolic CHF.  Amiodarone and DCCV were tried for atrial fibrillation in the past without success.   Patient has been noted to have a low EF since 2013.  EF improved by to normal by 2015 but dropped to 30-35% by 11/16.  Cath in 6/17 showed some CAD but not enough to cause cardiomyopathy.  He was admitted in 3/18 with PNA, atrial fibrillation/RVR, and acute on chronic systolic CHF.  Echo in 3/18 showed fall in EF to 10-15%.   I took him for right and left heart cath in 4/18, which showed nonobstructive CAD and relatively optimized filling pressures.    Syncope in 11/18. Loop recorder showed 6 second pause and periods of complete heart block. He had Medtronic BiV ICD placed in 12/18.  In 1/19, he had AV nodal ablation to promote BiV pacing.   Echo was done 6/19 with EF 25-30% with moderate RV dilation/mildly decreased systolic function. CPX 7/19 was submaximal, probably mild functional impairment.    Echo in 1/21 showed EF 35-40% with diffuse hypokinesis, moderate RV dilation with mildly decreased systolic function.   He was admitted with COVID-19 PNA in 2/19.  He had AKI/hypotension and Entresto was stopped.   Echo in 3/22 showed EF 40% with diffuse hypokinesis, mild RV enlargement, mildly decreased RV systolic function.   Presented to [facility] 7/22 with weakness and dizziness. Found to be in hemorrhagic shock from acute GIB. Hgb 5.0, INR 8.4. He was on Coumadin for atrial fibrillation. He was resuscitated with PRBCs, IVF, and given Vitamin K. His Entresto, beta blocker and torsemide were held due to hypotension. He initially required ICU monitoring due to  hypotension requiring pressors. GI team was consulted and planned to treat with oral antibiotics for suspected diverticular bleed with plans for outpatient colonoscopy.  Coumadin was transitioned to Eliquis.  He was discharged back on beta blocker, Entresto and SGLT2i. Dig, spiro and torsemide were held at discharge. Weight 259 lbs.  He returns for followup of CHF.  He had left TKR in 1/23.  Walking better, using cane still.  He has slept in his recliner for years due to his back.  No significant exertional dyspnea currently though not very active. No lightheadedness.  Rare atypical chest pain.  He has started back to going to the [facility]  Weight up 2 lbs.   MDT device interrogation: BiV pacing 99.8%, no VT/AF, stable thoracic impedance.   Labs (4/18): pro-BNP 904, K 3.1, creatinine 1.39 => 1.08 with BUN 66 => 37, hgb 14.1.  Labs (5/18): hemoglobin 13.1 Labs (6/18): K 4, creatinine 1.3, BNP 294 Labs (7/18): K 3.8, creatinine 1.23, LDL 96, HDL 42 Labs (8/18): K 4.5, creatinine 1.22 Labs (10/18): digoxin 0.4 Labs (12/18): LDL 115, HDL 37, TGs 214, K 4.2, creatinine 0.84 Labs (1/19): K 4.4, creatinine 0.75, pro-BNP 433 Labs (2/19): digoxin 0.4 Labs (3/19): LDL 121, hgb 12.8, K 4.5, creatinine 0.98 Labs (6/19): LDL 48, HDL 45, K 5, creatinine 0.94, digoxin 0.3 Labs (9/19): K 3.7, creatinine 1.09 => 1.32, digoxin 0.2 Labs (11/19): LDL 115 Labs (12/20): K 3.9, creatinine 0.87, LDL 94  Labs (3/21): K 4, creatinine 1.08 Labs (  6/21): LDL 109 Labs (7/21): K 3.7, creatinine 1.15 Labs (12/21): LDL 129, TGs 239, hgb 12.9, K 3.9, creatinine 0.98, digoxin 0.8 Labs (1/22): ALT 76, AST 40, LDL 162, K 3.8 =>4.6, creatinine 1.16 => 1.12, digoxin 0.7, repeat LFTs normal Labs (5/22): LDL 72, TGs 335, K 4.2, creatinine 1.29 Labs (7/22): K 4.0, creatinine 0.77 Labs (8/22): K 4.0, creatinine 1.11 Labs (9/22): K 3.7, creatinine 1.14, hgb 11.7 Labs (3/23): LDL 28, TGs 132, LFTs normal, K 4.3, creatinine  1.0  PMH: 1. Asthma 2. COPD: On home oxygen. Prior smoker.  3. Atrial fibrillation: Permanent.  He failed DCCV and amiodarone in the past . 4. HTN 5. GERD 6. Hyperlipidemia: Myalgias with atorvastatin 7. BPH.  8. Chronic systolic CHF: Nonischemic cardiomyopathy.  - EF 25% by TEE in 7/13.  Normalized on 2015 echo. - Echo (11/16): EF 30-35%.  - LHC/RHC (6/17): 80% ostial stenosis small D1, 30-40% pLAD.  Mean RA 4, PA 26/10, mean PCWP 11, CI 2.33.  - Echo (3/18): EF 10-15%, diffuse hypokinesis, severe LV dilation, moderate RV dilation with severely decreased systolic function.  - LHC/RHC (4/18): 40% proximal LAD.  Mean RA 8, PA 37/10 mean 22, mean PCWP 22, LVEDP 9, CI 2.39. - Medtronic BiV ICD placement in 12/18 with AV nodal ablation to promote BiV pacing in 1/19.  - Echo (6/19): EF 25-30% with mild LV dilation, moderately dilated RV with mildly decreased systolic function, IVC not dilated, PASP 23 mmHg.  - CPX (7/19): peak VO2 17.6 (78% predicted), VE/VCO2 slope 31, RER 1.02 => submaximal, probably mild functional impairment.  - Echo (1/21): EF 35-40% with diffuse hypokinesis, moderate RV dilation with mildly decreased systolic function. - Echo (3/22): EF 40% with diffuse hypokinesis, mild RV enlargement, mildly decreased RV systolic function.  9. Morbid obesity 10. ILR in place.  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 Complete heart block: s/p Medtronic CRT-D.  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 OSA: Untreated, unable to tolerate CPAP.  15. COVID-19 PNA 2/21.  16. Hyperlipidemia: Myalgias with multiple statins.  17. PAD: ABIs 10/21 noncompressible on right but TBI normal, normal ABI and TBI on left.  18. GI bleeding: 7/22, diverticulosis.  19. Left TKR (1/23)  Social History   Socioeconomic History   Marital status: Divorced    Spouse name: Not on file   Number of children: 2   Years of education: Not on file   Highest education level: Not on file  Occupational History    Occupation: retired/disabled. prev worked in Therapist, sports.    Employer: DISABLED  Tobacco Use   Smoking status: Former    Packs/day: 2.00    Years: 30.00    Pack years: 60.00    Types: Cigarettes    Quit date: 10/16/2007    Years since quitting: 14.4   Smokeless tobacco: Never  Vaping Use   Vaping Use: Never used  Substance and Sexual Activity   Alcohol use: No   Drug use: No   Sexual activity: Not Currently  Other Topics Concern   Not on file  Social History Narrative   Lives alone.   Social Determinants of Health   Financial Resource Strain: Low Risk    Difficulty of Paying Living Expenses: Not hard at all  Food Insecurity: No Food Insecurity   Worried About Charity fundraiser in the Last Year: Never true   [place] in the Last Year: Never true  Transportation Needs: No Transportation Needs   Lack of Transportation (Medical): No  Lack of Transportation (Non-Medical): No  Physical Activity: Inactive   Days of Exercise per Week: 0 days   Minutes of Exercise per Session: 0 min  Stress: No Stress Concern Present   Feeling of Stress : Not at all  Social Connections: Moderately Integrated   Frequency of Communication with Friends and Family: More than three times a week   Frequency of Social Gatherings with Friends and Family: More than three times a week   Attends Religious Services: More than 4 times per year   Active Member of [employer] or Organizations: Yes   Attends Music therapist: More than 4 times per year   Marital Status: Never married  Human resources officer Violence: Not At Risk   Fear of Current or Ex-Partner: No   Emotionally Abused: No   Physically Abused: No   Sexually Abused: No   Family History  Problem Relation Age of Onset   COPD Mother    Asthma Mother    Colon polyps Mother    Allergies Mother    Hypothyroidism Mother    Asthma Maternal Grandmother    Colon cancer Neg Hx    ROS: All systems reviewed and negative except as  per HPI.   Current Outpatient Medications  Medication Sig Dispense Refill   acetaminophen (TYLENOL) 500 MG tablet Take 500 mg by mouth every 6 (six) hours as needed for moderate pain.     albuterol (VENTOLIN HFA) 108 (90 Base) MCG/ACT inhaler INHALE 2 PUFFS BY MOUTH 4 TIMES A DAY AS NEEDED 54 each 3   ALPRAZolam (XANAX) 0.5 MG tablet TAKE 2 TABLETS BY MOUTH AT BEDTIME 60 tablet 5   apixaban (ELIQUIS) 5 MG TABS tablet Take 1 tablet (5 mg total) by mouth 2 (two) times daily. 60 tablet 11   augmented betamethasone dipropionate (DIPROLENE-AF) 0.05 % cream Apply 1 application topically daily as needed (itching).     carisoprodol (SOMA) 350 MG tablet TAKE 1 TABLET BY MOUTH THREE TIMES A DAY AS NEEDED FOR MUSCLE SPASMS 90 tablet 2   carvedilol (COREG) 6.25 MG tablet Take 1 tablet (6.25 mg total) by mouth 2 (two) times daily. 60 tablet 11   Cholecalciferol 100 MCG (4000 UT) CAPS 1 tab by mouth once daily 30 capsule 99   dapagliflozin propanediol (FARXIGA) 10 MG TABS tablet Take 1 tablet (10 mg total) by mouth daily before breakfast. 90 tablet 3   doxepin (SINEQUAN) 10 MG capsule TAKE 2 CAPSULES (20 MG TOTAL) BY MOUTH AT BEDTIME AS NEEDED. 180 capsule 1   Dupilumab (DUPIXENT) 300 MG/2ML SOPN Inject 300 mg into the skin every 14 (fourteen) days.     eplerenone (INSPRA) 50 MG tablet Take 1 tablet (50 mg total) by mouth daily. 90 tablet 3   Evolocumab (REPATHA SURECLICK) 035 MG/ML SOAJ INJECT 1 PEN INTO THE SKIN EVERY 14 (FOURTEEN) DAYS. 6 mL 3   fluticasone (CUTIVATE) 0.05 % cream SMARTSIG:Topical 1-2 Times Daily PRN     fluticasone (FLONASE) 50 MCG/ACT nasal spray PLACE 2 SPRAYS INTO BOTH NOSTRILS DAILY AS NEEDED FOR ALLERGIES OR RHINITIS. 48 mL 1   hydrOXYzine (ATARAX/VISTARIL) 10 MG tablet TAKE 1 TABLET BY MOUTH 3 TIMES DAILY AS NEEDED FOR ITCHING. 60 tablet 2   ketoconazole (NIZORAL) 2 % cream APPLY 1 APPLICATION TOPICALLY DAILY 30 g 1   lidocaine (LIDODERM) 5 % APPLY 1 PATCH TO AFFECTED AREA FOR UP TO  12 HOURS. DO NOT USE MORE THAN ONE PATCH IN 24 HOURS. 90 patch 2  metolazone (ZAROXOLYN) 2.5 MG tablet Take 1 tablet (2.5 mg total) by mouth as directed. With additional 40 meq of potassium 5 tablet 0   omeprazole (PRILOSEC) 20 MG capsule TAKE 1 CAPSULE BY MOUTH TWICE A DAY 180 capsule 1   Potassium Chloride (KLOR-CON PO) Take 20 mcg by mouth daily.     silver sulfADIAZINE (SILVADENE) 1 % cream APPLY TOPICALLY TO AFFECTED AREA EVERY DAY 50 g 3   tadalafil (CIALIS) 20 MG tablet Take 0.5-1 tablets (10-20 mg total) by mouth every other day as needed for erectile dysfunction. 10 tablet 11   tamsulosin (FLOMAX) 0.4 MG CAPS capsule TAKE 1 CAPSULE BY MOUTH EVERY DAY 90 capsule 3   torsemide (DEMADEX) 20 MG tablet TAKE 3 TABLETS (60 MG TOTAL) BY MOUTH 2 (TWO) TIMES DAILY. 540 tablet 1   traMADol (ULTRAM) 50 MG tablet Take by mouth.     traZODone (DESYREL) 100 MG tablet TAKE 2 TABLETS BY MOUTH AT BEDTIME AS NEEDED 180 tablet 1   TUMS 500 MG chewable tablet Chew 500-2,000 mg by mouth every 4 (four) hours as needed for indigestion or heartburn.      venlafaxine XR (EFFEXOR-XR) 75 MG 24 hr capsule TAKE 1 CAPSULE EVERY DAY WITH BREAKFAST 90 capsule 3   zolpidem (AMBIEN) 10 MG tablet TAKE 1 TABLET BY MOUTH EVERY DAY AT BEDTIME AS NEEDED 90 tablet 1   losartan (COZAAR) 25 MG tablet Take 1 tablet (25 mg total) by mouth daily. 30 tablet 11   No current facility-administered medications for this encounter.   Wt Readings from Last 3 Encounters:  03/20/22 112.2 kg (247 lb 6.4 oz)  02/20/22 109.8 kg (242 lb)  01/03/22 110.3 kg (243 lb 3.2 oz)   BP 120/78   Pulse 70   Wt 112.2 kg (247 lb 6.4 oz)   SpO2 95%   BMI 35.50 kg/m  General: NAD Neck: No JVD, no thyromegaly or thyroid nodule.  Lungs: Clear to auscultation bilaterally with normal respiratory effort. CV: Nondisplaced PMI.  Heart regular S1/S2, no S3/S4, no murmur.  No peripheral edema.  No carotid bruit.  Normal pedal pulses.  Abdomen: Soft,  nontender, no hepatosplenomegaly, no distention.  Skin: Intact without lesions or rashes.  Neurologic: Alert and oriented x 3.  Psych: Normal affect. Extremities: No clubbing or cyanosis.  HEENT: Normal.   Assessment/Plan:  1. Atrial fibrillation: Permanent.  Now s/p AV nodal ablation to allow effective BiV pacing.  - Continue Eliquis. CBC today.   2. COPD: No longer smoking.  Not using oxygen.  3. Chronic systolic CHF: Nonischemic cardiomyopathy based on 6/17 cath. However, he had a recent significant fall in EF from 30-35% to 10-15% on echo from 3/18.  LHC/RHC in 4/18 showed nonobstructive CAD and relatively optimized filling pressures.  He now has Medtronic CRT-D device with AV nodal ablation to allow BiV pacing.  Echo 6/19 with EF 25-30% with mildly decreased RV systolic function. CPX 7/19 was submaximal but likely only mild functional limitation from CHF.  Echo in 1/21 showed EF 35-40%, echo in 3/22 showed EF up to 40% with mildly decreased RV systolic function.  NYHA class II, functional class confounded by body habitus and physical deconditioning. He is not overloaded on exam or by Optivol. - Continue Coreg 6.25 mg bid. - Continue torsemide 60 mg bid.   - Continue eplerenone 50 mg daily (off spiro with gynecomastia).   - Increase losartan to 25 mg daily (did not tolerate low dose Entresto with dizziness and  low BPs).  BMET today and in 10 days. - Continue Farxiga 10 mg daily. - Repeat echo at next appt 4. CAD: Nonobstructive on 4/18 cath.  No exertional chest pain.  - No ASA with Eliquis use. - Statin myalgias, continue Repatha. Good LDL in 3/23.  5. Complete heart block: Now has Medtronic CRT-D device and s/p AV nodal ablation.  6. OSA: Unable to tolerate CPAP.   7. Elevated LFTs: Most recent LFTs back to normal.    Followup in 4 months with echo  Signed, Loralie Champagne, MD  03/21/2022  [facility] [address] [facility] [place] [address] [phone] (office) [phone] (fax)

## 2022-03-22 ENCOUNTER — Ambulatory Visit (INDEPENDENT_AMBULATORY_CARE_PROVIDER_SITE_OTHER): Payer: Medicare HMO | Admitting: *Deleted

## 2022-03-22 DIAGNOSIS — I4819 Other persistent atrial fibrillation: Secondary | ICD-10-CM

## 2022-03-22 DIAGNOSIS — I255 Ischemic cardiomyopathy: Secondary | ICD-10-CM
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
## 2022-03-22 NOTE — Addendum Note (Signed)
Addended by: Knox Royalty on: 03/22/2022 03:57 PM   Modules accepted: Orders

## 2022-03-22 NOTE — Patient Instructions (Signed)
Visit Information  [name], thank you for taking time to talk with me today. Please don't hesitate to contact me if I can be of assistance to you before our next scheduled telephone appointment  Below are the goals we discussed today:  Patient Self-Care Activities: Patient Zachary "[name]" will: As you continue your exercise program: please go slow and conservatively; gradually build up your exercise program over time Continue to take precautions to prevent falls- use your cane as needed Continue to weigh yourself daily and write your weights at home down on paper or on the calendar- we will review these each time we have a telephone visit.  Today you reported a weight of 247.4 lbs Call office if you gain more than 2 pounds in one day or 5 pounds in one week, especially if this is associated with chest tightness, swelling in your legs/ ankles, and new or worsened shortness of breath Take your medications as prescribed; call your doctors if you develop concerns around your medications Watch for swelling in feet, ankles and legs every day: contact your cardiologist if you believe your swelling is increasing Keep legs up while sitting Continue to limit the salt in your diet: decrease your salt intake and use salt only in moderation; eat a heart healthy, low cholesterol, low salt diet If you develop signs or symptoms of being in Atrial Fibrillation or having heart rhythm disturbances, call your cardiology (heart doctor) team right away  Our next scheduled telephone follow up visit/ appointment is scheduled on:   Monday, July 02, 2022 at 2:15 pm- This is a PHONE [place] appointment  If you need to cancel or re-schedule our visit, please call [phone] and our care guide team will be happy to assist you.   I look forward to hearing about your progress.   [name], RN, BSN, [place] [phone]: direct office  If you are  experiencing a Mental Health or [place] or need someone to talk to, please  call the Suicide and Crisis Lifeline: 988 call the [place] National Suicide Prevention Lifeline: 732 681 1844 or TTY: (807) 257-4860 TTY 408-653-2206) to talk to a trained counselor call 1-800-273-TALK (toll free, 24 hour hotline) go to [facility] [address] [place] [phone]) call 911   Patient verbalizes understanding of instructions and care plan provided today and agrees to view in [place]. Active MyChart status and patient understanding of how to access instructions and care plan via MyChart confirmed with patient    Exercise Information for Aging Adults Staying physically active is important as you age. Physical activity and exercise can help in maintaining quality of life, health, physical function, and reducing falls. The four types of exercises that are best for older adults are endurance, strength, balance, and flexibility. Contact your health care provider before you start any exercise routine. Ask your health care provider what activities are safe for you. What are the risks? Risks associated with exercising include: Overdoing it. This may lead to sore muscles or fatigue. Falls. Injuries. Dehydration. How to do these exercises Endurance exercises Endurance (aerobic) exercises raise your breathing rate and heart rate. Increasing your endurance helps you do everyday tasks and stay healthy. By improving the health of your body system that includes your heart, lungs, and blood vessels (circulatory system), you may also delay or prevent diseases such as heart disease, diabetes, and weak bones (osteoporosis). Types of endurance exercises include: Sports. Indoor activities,  such as using gym equipment, doing water aerobics, or dancing. Outdoor activities, such as biking or jogging. Tasks around the house, such as gardening, yard work, and heavy  household chores like cleaning. Walking, such as hiking or walking around your neighborhood. When doing endurance exercises, make sure you: Are aware of your surroundings. Use safety equipment as directed. Dress in layers when exercising outdoors. Drink plenty of water to stay well hydrated. Build up endurance slowly. Start with 10 minutes at a time, and gradually build up to doing 30 minutes at a time. Unless your health care provider gave you different instructions, aim to exercise for a total of 150 minutes a week. Spread out that time so you are working on endurance 3 or more days a week. Strength exercises Lifting, pulling, or pushing weights helps to strengthen muscles. Having stronger muscles makes it easier to do everyday activities, such as getting up from a chair, climbing stairs, carrying groceries, and playing with grandchildren. Strength exercises include arm and leg exercises that may be done: With weights. Without weights (using your own body weight). With a resistance band. When doing strength exercises: Move smoothly and steadily. Do not suddenly thrust or jerk the weights, the resistance band, or your body. Start with no weights or with light weights, and gradually add more weight over time. Eventually, aim to use weights that are hard or very hard for you to lift. This means that you are able to do 8 repetitions with the weight, and the last few repetitions are very challenging. Lift or push weights into position for 3 seconds, hold the position for 1 second, and then take 3 seconds to return to your starting position. Breathe out (exhale) during difficult movements, like lifting or pushing weights. Breathe in (inhale) to relax your muscles before the next repetition. Consider alternating arms or legs, especially when you first start strength exercises. Expect some slight muscle soreness after each session. Do strength exercises on 2 or more days a week, for 30 minutes at a  time. Avoid exercising the same muscle groups two days in a row. For example, if you work on your leg muscles one day, work on your arm muscles the next day. When you can do two sets of 10-15 repetitions with a certain weight, increase the amount of weight. Balance exercises Balance exercises can help to prevent falls. Balance exercises include: Standing on one foot. Heel-to-toe walk. Balance walk. Tai chi. Make sure you have something sturdy to hold onto while doing balance exercises, such as a sturdy chair. As your balance improves, challenge yourself by holding on to the chair with one hand instead of two, and then with no hands. Trying exercises with your eyes closed also challenges your balance, but be sure to have a sturdy surface (like a countertop) close by in case you need it. Do balance exercises as often as you want, or as often as directed by your health care provider. Flexibility exercises  Flexibility exercises improve how far you can bend, straighten, move, or rotate parts of your body (range of motion). These exercises also help you do everyday activities such as getting dressed or reaching for objects. Flexibility exercises include stretching different parts of the body, and they may be done in a standing or seated position or on the floor. When stretching, make sure you: Keep a slight bend in your arms and legs. Avoid completely straightening ("locking") your joints. Do not stretch so far that you feel pain. You should feel  a mild stretching feeling. You may try stretching farther as you become more flexible over time. Relax and breathe between stretches. Hold on to something sturdy for balance as needed. Hold each stretch for 10-30 seconds. Repeat each stretch 3-5 times. General safety tips Exercise in well-lit areas. Do not hold your breath during exercises or stretches. Warm up before exercising, and cool down after exercising. This can help prevent injury. Drink plenty of  water during exercise or any activity that makes you sweat. If you are not sure if an exercise is safe for you, or you are not sure how to do an exercise, talk with your health care provider. This is especially important if you have had surgery on muscles, bones, or joints (orthopedic surgery). Where to find more information You can find more information about exercise for older adults from: Your local health department, fitness center, or community center. These facilities may have programs for aging adults. [employer] on Aging: http://kim-miller.com/ National Council on Aging: www.ncoa.org Summary Staying physically active is important as you age. Doing endurance, strength, balance, and flexibility exercises can help in maintaining quality of life, health, physical function, and reducing falls. Make sure to contact your health care provider before you start any exercise routine. Ask your health care provider what activities are safe for you. This information is not intended to replace advice given to you by your health care provider. Make sure you discuss any questions you have with your health care provider. Document Revised: 02/13/2021 Document Reviewed: 02/13/2021 Elsevier Patient Education  [place].

## 2022-03-23 ENCOUNTER — Telehealth (HOSPITAL_COMMUNITY): Payer: Self-pay

## 2022-03-23 ENCOUNTER — Telehealth: Payer: Self-pay | Admitting: *Deleted

## 2022-03-23 DIAGNOSIS — I5032 Chronic diastolic (congestive) heart failure: Secondary | ICD-10-CM

## 2022-03-23 MED ORDER — TORSEMIDE 20 MG PO TABS: ORAL_TABLET | ORAL | 3 refills | Status: DC

## 2022-03-23 NOTE — Telephone Encounter (Signed)
-----   Message from Larey Dresser, MD sent at 03/22/2022  4:59 PM EDT ----- BUN/creatinine up significantly, hold torsemide for a day, then decrease to 40 mg bid. BMET 1 week.

## 2022-03-23 NOTE — Telephone Encounter (Signed)
   Telephone encounter was:  Successful.  03/23/2022 Name: [name] MRN: [id number] DOB: 1956/02/14  [name] is a 66 y.o. year old male who is a primary care patient of [name], Hunt Oris, MD . The community resource team was consulted for assistance with  Housing  Care guide performed the following interventions: Discussed resources to assist with housing . Will provide housing lists to this patient and also complete a referral to LCSW Follow Up Plan:  Care guide will follow up with patient by phone over the next week  [place], Care Management  [phone] [address] , [place] [address] Email : [email or web address]

## 2022-03-23 NOTE — Telephone Encounter (Signed)
Patient's med list has been up dated to reflect the med change and pt's labs has been ordered as future and his appointment is scheduled. Pt aware, agreeable, and verbalized understanding

## 2022-03-24 ENCOUNTER — Other Ambulatory Visit: Payer: Self-pay | Admitting: Internal Medicine

## 2022-03-27 ENCOUNTER — Telehealth: Payer: Self-pay | Admitting: *Deleted

## 2022-03-27 NOTE — Telephone Encounter (Signed)
Ortho bundle 90 day call attempted; no answer and left VM requesting call back to CM.

## 2022-03-27 NOTE — Telephone Encounter (Signed)
Ortho bundle 90 day call and outcomes survey completed.

## 2022-03-28 ENCOUNTER — Other Ambulatory Visit (HOSPITAL_COMMUNITY): Payer: Self-pay | Admitting: *Deleted

## 2022-03-30 ENCOUNTER — Other Ambulatory Visit (HOSPITAL_COMMUNITY): Payer: Medicare HMO

## 2022-03-30 ENCOUNTER — Ambulatory Visit (HOSPITAL_COMMUNITY)
Admission: RE | Admit: 2022-03-30 | Discharge: 2022-03-30 | Disposition: A | Discharge status: Home / Self Care | Payer: Medicare HMO | Source: Ambulatory Visit | Attending: Internal Medicine | Admitting: Internal Medicine

## 2022-03-30 DIAGNOSIS — I5032 Chronic diastolic (congestive) heart failure: Secondary | ICD-10-CM

## 2022-03-30 LAB — BASIC METABOLIC PANEL
Anion gap: 8 (ref 5–15)
BUN: 29 mg/dL — ABNORMAL HIGH (ref 8–23)
CO2: 25 mmol/L (ref 22–32)
Calcium: 8.8 mg/dL — ABNORMAL LOW (ref 8.9–10.3)
Chloride: 107 mmol/L (ref 98–111)
Creatinine, Ser: 1.37 mg/dL — ABNORMAL HIGH (ref 0.61–1.24)
GFR, Estimated: 57 mL/min — ABNORMAL LOW (ref >60)
Glucose, Bld: 89 mg/dL (ref 70–99)
Potassium: 4.4 mmol/L (ref 3.5–5.1)
Sodium: 140 mmol/L (ref 135–145)

## 2022-04-02 ENCOUNTER — Ambulatory Visit: Payer: Medicare HMO

## 2022-04-04 NOTE — Progress Notes (Deleted)
Electrophysiology Office Note Date: 04/04/2022  ID:  [name], [name] February 03, 1956, MRN [id number]  PCP: [name], MD Primary Cardiologist: None Electrophysiologist: Virl Axe, MD   CC: Routine ICD follow-up  [name] is a 66 y.o. male seen today for Virl Axe, MD for routine electrophysiology followup.  Since last being seen in our clinic the patient reports doing ***.  he denies chest pain, palpitations, dyspnea, PND, orthopnea, nausea, vomiting, dizziness, syncope, edema, weight gain, or early satiety. He has not had ICD shocks.   Device History: Medtronic BiV ICD implanted 09/2017 for NICM  Past Medical History:  Diagnosis Date   AICD (automatic cardioverter/defibrillator) present    Anemia    supposed to be taking Vit B but doesn't   ANXIETY    takes Xanax nightly   Arthritis    Asthma    Albuterol prn and Advair daily;also takes Prednisone daily   Atrial fibrillation ([place]) 09/06/2015   Cardiomyopathy ([place])    a. EF 25% TEE July 2013; b. EF normalized 2015;  c. 03/2015 Echo: EF 40-45%, difrf HK, PASP 38 mmHg, Mild MR, sev LAE/RAE.   CHF (congestive heart failure) (HCC)    Chronic constipation    takes OTC stool softener   COPD (chronic obstructive pulmonary disease) (HCC)    "one dr says COPD; one dr says emphysema" (09/18/2017)   DEPRESSION    takes Zoloft and Doxepin daily   Diverticulitis    DYSKINESIA, ESOPHAGUS    Dysrhythmia    Atrial Fibrilation   Emphysema of lung [facility])    "one dr says COPD; one dr says emphysema" (09/18/2017)   Essential hypertension        FIBROMYALGIA    GERD (gastroesophageal reflux disease)        Glaucoma    HYPERLIPIDEMIA    a. Intolerant to statins.   INSOMNIA    takes Ambien nightly   Myocardial infarction [facility])    a. 2012 Myoview notable for prior infarct;  b. 03/2015 Lexiscan CL: EF 37%, diff HK, small area of inferior infarct from apex to base-->Med Rx.   Myocardial infarction ([place])    O2 dependent     "2.5L q hs & prn" (09/18/2017)   Paroxysmal atrial fibrillation ([place])    a. CHA2DS2VASc = 3--> takes Coumadin;  b. 03/15/2015 Successful TEE/DCCV;  c. 03/2015 recurrent afib, Amio d/c'd in setting of hyperthyroidism.   Peripheral neuropathy    Pneumonia 12/2016   Rash and other nonspecific skin eruption 04/12/2009   no cause found saw dermatologists x 2 and allergist   SLEEP APNEA, OBSTRUCTIVE    a. doesn't use CPAP   Syncope    a. 03/2015 s/p MDT LINQ.   Type II diabetes mellitus ([employer])        Past Surgical History:  Procedure Laterality Date   ACNE CYST REMOVAL     2 on back    AV NODE ABLATION N/A 10/25/2017   Procedure: AV NODE ABLATION;  Surgeon: Deboraha Sprang, MD;  Location: [place] [facility];  Service: Cardiovascular;  Laterality: N/A;   BIV ICD INSERTION CRT-D N/A 09/18/2017   Procedure: BIV ICD INSERTION CRT-D;  Surgeon: Deboraha Sprang, MD;  Location: [place] [facility];  Service: Cardiovascular;  Laterality: N/A;   CARDIAC CATHETERIZATION N/A 03/21/2016   Procedure: Right/Left Heart Cath and Coronary Angiography;  Surgeon: Larey Dresser, MD;  Location: [place] [facility];  Service: Cardiovascular;  Laterality: N/A;   CARDIOVERSION  04/18/2012   Procedure: CARDIOVERSION;  Surgeon: Fay Records, MD;  Location: [place];  Service: Cardiovascular;  Laterality: N/A;   CARDIOVERSION  04/25/2012   Procedure: CARDIOVERSION;  Surgeon: Thayer Headings, MD;  Location: [place];  Service: Cardiovascular;  Laterality: N/A;   CARDIOVERSION  04/25/2012   Procedure: CARDIOVERSION;  Surgeon: Fay Records, MD;  Location: [place];  Service: Cardiovascular;  Laterality: N/A;   CARDIOVERSION  05/09/2012   Procedure: CARDIOVERSION;  Surgeon: Sherren Mocha, MD;  Location: [place];  Service: Cardiovascular;  Laterality: N/A;  changed from crenshaw to cooper by trish/leone-endo   CARDIOVERSION N/A 03/15/2015   Procedure: CARDIOVERSION;  Surgeon: Thayer Headings, MD;  Location: [place];  Service:  Cardiovascular;  Laterality: N/A;   COLONOSCOPY     COLONOSCOPY WITH PROPOFOL N/A 10/21/2014   Procedure: COLONOSCOPY WITH PROPOFOL;  Surgeon: Ladene Artist, MD;  Location: [facility];  Service: Endoscopy;  Laterality: N/A;   EP IMPLANTABLE DEVICE N/A 04/06/2015   Procedure: Loop Recorder Insertion;  Surgeon: Evans Lance, MD;  Location: [place] [facility];  Service: Cardiovascular;  Laterality: N/A;   ESOPHAGOGASTRODUODENOSCOPY     JOINT REPLACEMENT     LOOP RECORDER REMOVAL N/A 09/18/2017   Procedure: LOOP RECORDER REMOVAL;  Surgeon: Deboraha Sprang, MD;  Location: [place] [facility];  Service: Cardiovascular;  Laterality: N/A;   RIGHT/LEFT HEART CATH AND CORONARY ANGIOGRAPHY N/A 01/28/2017   Procedure: Right/Left Heart Cath and Coronary Angiography;  Surgeon: Larey Dresser, MD;  Location: [place] [facility];  Service: Cardiovascular;  Laterality: N/A;   TEE WITHOUT CARDIOVERSION  04/25/2012   Procedure: TRANSESOPHAGEAL ECHOCARDIOGRAM (TEE);  Surgeon: Thayer Headings, MD;  Location: [place];  Service: Cardiovascular;  Laterality: N/A;   TEE WITHOUT CARDIOVERSION N/A 03/15/2015   Procedure: TRANSESOPHAGEAL ECHOCARDIOGRAM (TEE);  Surgeon: Thayer Headings, MD;  Location: [place];  Service: Cardiovascular;  Laterality: N/A;   TONSILLECTOMY AND ADENOIDECTOMY     TOTAL KNEE ARTHROPLASTY Right 06/15/2014   Procedure: TOTAL KNEE ARTHROPLASTY;  Surgeon: Renette Butters, MD;  Location: [place];  Service: Orthopedics;  Laterality: Right;   TOTAL KNEE ARTHROPLASTY Left 10/17/2021   Procedure: Left TOTAL KNEE ARTHROPLASTY;  Surgeon: Mcarthur Rossetti, MD;  Location: [place];  Service: Orthopedics;  Laterality: Left;    Current Outpatient Medications  Medication Sig Dispense Refill   acetaminophen (TYLENOL) 500 MG tablet Take 500 mg by mouth every 6 (six) hours as needed for moderate pain.     albuterol (VENTOLIN HFA) 108 (90 Base) MCG/ACT inhaler INHALE 2 PUFFS BY MOUTH 4 TIMES A DAY AS  NEEDED 54 each 3   ALPRAZolam (XANAX) 0.5 MG tablet TAKE 2 TABLETS BY MOUTH AT BEDTIME 60 tablet 5   apixaban (ELIQUIS) 5 MG TABS tablet Take 1 tablet (5 mg total) by mouth 2 (two) times daily. 60 tablet 11   augmented betamethasone dipropionate (DIPROLENE-AF) 0.05 % cream Apply 1 application topically daily as needed (itching).     carisoprodol (SOMA) 350 MG tablet TAKE 1 TABLET BY MOUTH THREE TIMES A DAY AS NEEDED FOR MUSCLE SPASMS 90 tablet 2   carvedilol (COREG) 6.25 MG tablet Take 1 tablet (6.25 mg total) by mouth 2 (two) times daily. 60 tablet 11   Cholecalciferol 100 MCG (4000 UT) CAPS 1 tab by mouth once daily 30 capsule 99   dapagliflozin propanediol (FARXIGA) 10 MG TABS tablet Take 1 tablet (10 mg total) by mouth daily before breakfast. 90 tablet  3   doxepin (SINEQUAN) 10 MG capsule TAKE 2 CAPSULES (20 MG TOTAL) BY MOUTH AT BEDTIME AS NEEDED. 180 capsule 1   Dupilumab (DUPIXENT) 300 MG/2ML SOPN Inject 300 mg into the skin every 14 (fourteen) days.     eplerenone (INSPRA) 50 MG tablet Take 1 tablet (50 mg total) by mouth daily. 90 tablet 3   Evolocumab (REPATHA SURECLICK) 779 MG/ML SOAJ INJECT 1 PEN INTO THE SKIN EVERY 14 (FOURTEEN) DAYS. 6 mL 3   fluticasone (CUTIVATE) 0.05 % cream SMARTSIG:Topical 1-2 Times Daily PRN     fluticasone (FLONASE) 50 MCG/ACT nasal spray PLACE 2 SPRAYS INTO BOTH NOSTRILS DAILY AS NEEDED FOR ALLERGIES OR RHINITIS. 48 mL 1   hydrOXYzine (ATARAX/VISTARIL) 10 MG tablet TAKE 1 TABLET BY MOUTH 3 TIMES DAILY AS NEEDED FOR ITCHING. 60 tablet 2   ketoconazole (NIZORAL) 2 % cream APPLY 1 APPLICATION TOPICALLY DAILY 30 g 1   lidocaine (LIDODERM) 5 % APPLY 1 PATCH TO AFFECTED AREA FOR UP TO 12 HOURS. DO NOT USE MORE THAN ONE PATCH IN 24 HOURS. 90 patch 2   losartan (COZAAR) 25 MG tablet Take 1 tablet (25 mg total) by mouth daily. 30 tablet 11   metolazone (ZAROXOLYN) 2.5 MG tablet Take 1 tablet (2.5 mg total) by mouth as directed. With additional 40 meq of potassium 5  tablet 0   omeprazole (PRILOSEC) 20 MG capsule TAKE 1 CAPSULE BY MOUTH TWICE A DAY 180 capsule 1   Potassium Chloride (KLOR-CON PO) Take 20 mcg by mouth daily.     silver sulfADIAZINE (SILVADENE) 1 % cream APPLY TOPICALLY TO AFFECTED AREA EVERY DAY 50 g 3   tadalafil (CIALIS) 20 MG tablet Take 0.5-1 tablets (10-20 mg total) by mouth every other day as needed for erectile dysfunction. 10 tablet 11   tamsulosin (FLOMAX) 0.4 MG CAPS capsule TAKE 1 CAPSULE BY MOUTH EVERY DAY 90 capsule 3   torsemide (DEMADEX) 20 MG tablet Take 2 tablets by mouth 2 times daily. 60 tablet 3   traMADol (ULTRAM) 50 MG tablet TAKE 1 TABLET BY MOUTH EVERY DAY AS NEEDED 30 tablet 2   traZODone (DESYREL) 100 MG tablet TAKE 2 TABLETS BY MOUTH AT BEDTIME AS NEEDED 180 tablet 1   TUMS 500 MG chewable tablet Chew 500-2,000 mg by mouth every 4 (four) hours as needed for indigestion or heartburn.      venlafaxine XR (EFFEXOR-XR) 75 MG 24 hr capsule TAKE 1 CAPSULE EVERY DAY WITH BREAKFAST 90 capsule 3   zolpidem (AMBIEN) 10 MG tablet TAKE 1 TABLET BY MOUTH EVERY DAY AT BEDTIME AS NEEDED 90 tablet 1   No current facility-administered medications for this visit.    Allergies:   Amiodarone, Statins, and Tape   Social History: Social History   Socioeconomic History   Marital status: Divorced    Spouse name: Not on file   Number of children: 2   Years of education: Not on file   Highest education level: Not on file  Occupational History   Occupation: retired/disabled. prev worked in Therapist, sports.    Employer: DISABLED  Tobacco Use   Smoking status: Former    Packs/day: 2.00    Years: 30.00    Total pack years: 60.00    Types: Cigarettes    Quit date: 10/16/2007    Years since quitting: 14.4   Smokeless tobacco: Never  Vaping Use   Vaping Use: Never used  Substance and Sexual Activity   Alcohol use: No   Drug  use: No   Sexual activity: Not Currently  Other Topics Concern   Not on file  Social History  Narrative   Lives alone.   Social Determinants of Health   Financial Resource Strain: Low Risk  (03/27/2021)   Overall Financial Resource Strain (CARDIA)    Difficulty of Paying Living Expenses: Not hard at all  Food Insecurity: No Food Insecurity (12/28/2021)   Hunger Vital Sign    Worried About Running Out of Food in the Last Year: Never true    Ran Out of Food in the Last Year: Never true  Transportation Needs: No Transportation Needs (03/22/2022)   PRAPARE - Hydrologist (Medical): No    Lack of Transportation (Non-Medical): No  Physical Activity: Inactive (03/27/2021)   Exercise Vital Sign    Days of Exercise per Week: 0 days    Minutes of Exercise per Session: 0 min  Stress: No Stress Concern Present (03/27/2021)   [place]    Feeling of Stress : Not at all  Social Connections: Moderately Integrated (03/27/2021)   Social Connection and Isolation Panel [NHANES]    Frequency of Communication with Friends and Family: More than three times a week    Frequency of Social Gatherings with Friends and Family: More than three times a week    Attends Religious Services: More than 4 times per year    Active Member of [employer] or Organizations: Yes    Attends Archivist Meetings: More than 4 times per year    Marital Status: Never married  Intimate Partner Violence: Not At Risk (03/27/2021)   Humiliation, Afraid, Rape, and Kick questionnaire    Fear of Current or Ex-Partner: No    Emotionally Abused: No    Physically Abused: No    Sexually Abused: No    Family History: Family History  Problem Relation Age of Onset   COPD Mother    Asthma Mother    Colon polyps Mother    Allergies Mother    Hypothyroidism Mother    Asthma Maternal Grandmother    Colon cancer Neg Hx     Review of Systems: All other systems reviewed and are otherwise negative except as noted above.   Physical  Exam: There were no vitals filed for this visit.   GEN- The patient is well appearing, alert and oriented x 3 today.   HEENT: normocephalic, atraumatic; sclera clear, conjunctiva pink; hearing intact; oropharynx clear; neck supple, no JVP Lymph- no cervical lymphadenopathy Lungs- Clear to ausculation bilaterally, normal work of breathing.  No wheezes, rales, rhonchi Heart- Regular rate and rhythm, no murmurs, rubs or gallops, PMI not laterally displaced GI- soft, non-tender, non-distended, bowel sounds present, no hepatosplenomegaly Extremities- no clubbing or cyanosis. No edema; DP/PT/radial pulses 2+ bilaterally MS- no significant deformity or atrophy Skin- warm and dry, no rash or lesion; ICD pocket well healed Psych- euthymic mood, full affect Neuro- strength and sensation are intact  ICD interrogation- reviewed in detail today,  See PACEART report  EKG:  EKG is ordered today. Personal review of EKG ordered today shows ***  Recent Labs: 05/10/2021: Magnesium 2.0 12/15/2021: B Natriuretic Peptide 72.2 12/20/2021: ALT 15; TSH 1.09 03/20/2022: Hemoglobin 13.4; Platelets 188 03/30/2022: BUN 29; Creatinine, Ser 1.37; Potassium 4.4; Sodium 140   Wt Readings from Last 3 Encounters:  03/20/22 247 lb 6.4 oz (112.2 kg)  02/20/22 242 lb (109.8 kg)  01/03/22 243 lb 3.2 oz (110.3  kg)     Other studies Reviewed: Additional studies/ records that were reviewed today include: Previous EP office notes.   Assessment and Plan:  1.  Chronic systolic dysfunction s/p Medtronic CRT-D  euvolemic today Stable on an appropriate medical regimen Normal ICD function See [facility] Art report No changes today  2. Permanent AF Rate controlled Continue eliquis for CHA2DS2VASc  of at least 5.   3. CAD Denies s/s ischemia   Current medicines are reviewed at length with the patient today.   =  Labs/ tests ordered today include: *** No orders of the defined types were placed in this  encounter.    Disposition:   Follow up with {Blank single:19197::"Dr. Allred","Dr. Arlan Organ. Klein","Dr. Camnitz","Dr. Lambert","EP APP"} in {Blank single:19197::"2 weeks","4 weeks","3 months","6 months","12 months","as usual post gen change"}    Signed, Annamaria Helling  04/04/2022 11:45 AM  [facility] [address] [place] [address] [phone] (office) [phone] (fax)

## 2022-04-05 ENCOUNTER — Encounter: Payer: Medicare HMO | Admitting: Student

## 2022-04-05 ENCOUNTER — Encounter: Payer: Medicare HMO | Admitting: Internal Medicine

## 2022-04-05 DIAGNOSIS — I4821 Permanent atrial fibrillation: Secondary | ICD-10-CM

## 2022-04-05 DIAGNOSIS — Z95810 Presence of automatic (implantable) cardiac defibrillator: Secondary | ICD-10-CM

## 2022-04-05 DIAGNOSIS — I5032 Chronic diastolic (congestive) heart failure: Secondary | ICD-10-CM

## 2022-04-09 ENCOUNTER — Telehealth: Payer: Self-pay | Admitting: *Deleted

## 2022-04-09 ENCOUNTER — Telehealth: Payer: Self-pay

## 2022-04-09 ENCOUNTER — Ambulatory Visit (INDEPENDENT_AMBULATORY_CARE_PROVIDER_SITE_OTHER): Payer: Medicare HMO

## 2022-04-09 DIAGNOSIS — Z95810 Presence of automatic (implantable) cardiac defibrillator: Secondary | ICD-10-CM | POA: Diagnosis not present

## 2022-04-09 DIAGNOSIS — I5032 Chronic diastolic (congestive) heart failure: Secondary | ICD-10-CM

## 2022-04-10 ENCOUNTER — Telehealth: Payer: Self-pay | Admitting: *Deleted

## 2022-04-11 ENCOUNTER — Telehealth: Payer: Self-pay | Admitting: *Deleted

## 2022-04-11 NOTE — Telephone Encounter (Signed)
   Telephone encounter was:  Successful.  04/11/2022 Name: [name] MRN: [id number] DOB: 1955/11/05  [name] is a 66 y.o. year old male who is a primary care patient of [name], Hunt Oris, MD . The community resource team was consulted for assistance with  housing  Care guide performed the following interventions: Follow up call placed to the patient to discuss status of referral.  Follow Up Plan:  No further follow up planned at this time. The patient has been provided with needed resources.  [place], Care Management  [phone] [address] , [place] [address] Email : [email or web address]

## 2022-04-13 ENCOUNTER — Encounter: Payer: Self-pay | Admitting: *Deleted

## 2022-04-13 DIAGNOSIS — I509 Heart failure, unspecified: Secondary | ICD-10-CM

## 2022-04-13 DIAGNOSIS — I4891 Unspecified atrial fibrillation: Secondary | ICD-10-CM | POA: Diagnosis not present

## 2022-04-13 NOTE — Progress Notes (Signed)
[name] [address] [place] [employer] Phone: [phone] Subjective:   [name], am serving as a scribe for Dr. Hulan Saas.  I'm seeing this patient by the request  of:  [name], MD  CC: Bilateral shoulder pain  IRJ:[id number]  02/20/2022 The patient responded well to the injection.  Discussed icing regimen and home exercises, which activities to do which ones to avoid, encouraged him to continue to work on weight loss.  Still think some of it is secondary to patient rehabbing for his knee replacement.  Follow-up again in 8-10 weeks.  Discussed with patient at great length, discussed icing regimen and home exercises, discussed which activities to do and which ones to avoid, increase activity slowly.  Patient knows that if he does have near end-stage arthritic changes noted bilaterally.  Discussed with patient about the possibility need for surgical intervention but still with patient having the knee replacement wants to avoid that completely.  Updated 04/24/2022 [name] is a 66 y.o. male coming in with complaint of bilateral shoulder pain. Both shoulder in pain. Would like injections. No other complaints.  Patient states that they are affecting daily activities.  Given the more discomfort on a daily basis.       Past Medical History:  Diagnosis Date   AICD (automatic cardioverter/defibrillator) present    Anemia    supposed to be taking Vit B but doesn't   ANXIETY    takes Xanax nightly   Arthritis    Asthma    Albuterol prn and Advair daily;also takes Prednisone daily   Atrial fibrillation ([place]) 09/06/2015   Cardiomyopathy ([place])    a. EF 25% TEE July 2013; b. EF normalized 2015;  c. 03/2015 Echo: EF 40-45%, difrf HK, PASP 38 mmHg, Mild MR, sev LAE/RAE.   CHF (congestive heart failure) (HCC)    Chronic constipation    takes OTC stool softener   COPD (chronic obstructive pulmonary disease) (HCC)    "one dr says  COPD; one dr says emphysema" (09/18/2017)   DEPRESSION    takes Zoloft and Doxepin daily   Diverticulitis    DYSKINESIA, ESOPHAGUS    Dysrhythmia    Atrial Fibrilation   Emphysema of lung [facility])    "one dr says COPD; one dr says emphysema" (09/18/2017)   Essential hypertension        FIBROMYALGIA    GERD (gastroesophageal reflux disease)        Glaucoma    HYPERLIPIDEMIA    a. Intolerant to statins.   INSOMNIA    takes Ambien nightly   Myocardial infarction [facility])    a. 2012 Myoview notable for prior infarct;  b. 03/2015 Lexiscan CL: EF 37%, diff HK, small area of inferior infarct from apex to base-->Med Rx.   Myocardial infarction ([place])    O2 dependent    "2.5L q hs & prn" (09/18/2017)   Paroxysmal atrial fibrillation ([place])    a. CHA2DS2VASc = 3--> takes Coumadin;  b. 03/15/2015 Successful TEE/DCCV;  c. 03/2015 recurrent afib, Amio d/c'd in setting of hyperthyroidism.   Peripheral neuropathy    Pneumonia 12/2016   Rash and other nonspecific skin eruption 04/12/2009   no cause found saw dermatologists x 2 and allergist   SLEEP APNEA, OBSTRUCTIVE    a. doesn't use CPAP   Syncope    a. 03/2015 s/p MDT LINQ.   Type II diabetes mellitus [facility])        Past Surgical  History:  Procedure Laterality Date   ACNE CYST REMOVAL     2 on back    AV NODE ABLATION N/A 10/25/2017   Procedure: AV NODE ABLATION;  Surgeon: Deboraha Sprang, MD;  Location: [place] CV LAB;  Service: Cardiovascular;  Laterality: N/A;   BIV ICD INSERTION CRT-D N/A 09/18/2017   Procedure: BIV ICD INSERTION CRT-D;  Surgeon: Deboraha Sprang, MD;  Location: [place] CV LAB;  Service: Cardiovascular;  Laterality: N/A;   CARDIAC CATHETERIZATION N/A 03/21/2016   Procedure: Right/Left Heart Cath and Coronary Angiography;  Surgeon: Larey Dresser, MD;  Location: [place] CV LAB;  Service: Cardiovascular;  Laterality: N/A;   CARDIOVERSION  04/18/2012   Procedure: CARDIOVERSION;  Surgeon: Fay Records, MD;  Location: [place];   Service: Cardiovascular;  Laterality: N/A;   CARDIOVERSION  04/25/2012   Procedure: CARDIOVERSION;  Surgeon: Thayer Headings, MD;  Location: [place];  Service: Cardiovascular;  Laterality: N/A;   CARDIOVERSION  04/25/2012   Procedure: CARDIOVERSION;  Surgeon: Fay Records, MD;  Location: [place];  Service: Cardiovascular;  Laterality: N/A;   CARDIOVERSION  05/09/2012   Procedure: CARDIOVERSION;  Surgeon: Sherren Mocha, MD;  Location: [place];  Service: Cardiovascular;  Laterality: N/A;  changed from [place] to [place] by trish/leone-endo   CARDIOVERSION N/A 03/15/2015   Procedure: CARDIOVERSION;  Surgeon: Thayer Headings, MD;  Location: [place];  Service: Cardiovascular;  Laterality: N/A;   COLONOSCOPY     COLONOSCOPY WITH PROPOFOL N/A 10/21/2014   Procedure: COLONOSCOPY WITH PROPOFOL;  Surgeon: Ladene Artist, MD;  Location: [facility];  Service: Endoscopy;  Laterality: N/A;   EP IMPLANTABLE DEVICE N/A 04/06/2015   Procedure: Loop Recorder Insertion;  Surgeon: Evans Lance, MD;  Location: [place] CV LAB;  Service: Cardiovascular;  Laterality: N/A;   ESOPHAGOGASTRODUODENOSCOPY     JOINT REPLACEMENT     LOOP RECORDER REMOVAL N/A 09/18/2017   Procedure: LOOP RECORDER REMOVAL;  Surgeon: Deboraha Sprang, MD;  Location: [place] CV LAB;  Service: Cardiovascular;  Laterality: N/A;   RIGHT/LEFT HEART CATH AND CORONARY ANGIOGRAPHY N/A 01/28/2017   Procedure: Right/Left Heart Cath and Coronary Angiography;  Surgeon: Larey Dresser, MD;  Location: [place] CV LAB;  Service: Cardiovascular;  Laterality: N/A;   TEE WITHOUT CARDIOVERSION  04/25/2012   Procedure: TRANSESOPHAGEAL ECHOCARDIOGRAM (TEE);  Surgeon: Thayer Headings, MD;  Location: [place];  Service: Cardiovascular;  Laterality: N/A;   TEE WITHOUT CARDIOVERSION N/A 03/15/2015   Procedure: TRANSESOPHAGEAL ECHOCARDIOGRAM (TEE);  Surgeon: Thayer Headings, MD;  Location: [place];  Service: Cardiovascular;  Laterality: N/A;    TONSILLECTOMY AND ADENOIDECTOMY     TOTAL KNEE ARTHROPLASTY Right 06/15/2014   Procedure: TOTAL KNEE ARTHROPLASTY;  Surgeon: Renette Butters, MD;  Location: [place];  Service: Orthopedics;  Laterality: Right;   TOTAL KNEE ARTHROPLASTY Left 10/17/2021   Procedure: Left TOTAL KNEE ARTHROPLASTY;  Surgeon: Mcarthur Rossetti, MD;  Location: [place];  Service: Orthopedics;  Laterality: Left;   Social History   Socioeconomic History   Marital status: Divorced    Spouse name: Not on file   Number of children: 2   Years of education: Not on file   Highest education level: Not on file  Occupational History   Occupation: retired/disabled. prev worked in Therapist, sports.    Employer: DISABLED  Tobacco Use   Smoking status: Former    Packs/day: 2.00    Years: 30.00    Total pack  years: 60.00    Types: Cigarettes    Quit date: 10/16/2007    Years since quitting: 14.5   Smokeless tobacco: Never  Vaping Use   Vaping Use: Never used  Substance and Sexual Activity   Alcohol use: No   Drug use: No   Sexual activity: Not Currently  Other Topics Concern   Not on file  Social History Narrative   Lives alone.   Social Determinants of Health   Financial Resource Strain: Low Risk  (03/27/2021)   Overall Financial Resource Strain (CARDIA)    Difficulty of Paying Living Expenses: Not hard at all  Food Insecurity: No Food Insecurity (12/28/2021)   Hunger Vital Sign    Worried About Running Out of Food in the Last Year: Never true    Ran Out of Food in the Last Year: Never true  Transportation Needs: No Transportation Needs (03/22/2022)   PRAPARE - Hydrologist (Medical): No    Lack of Transportation (Non-Medical): No  Physical Activity: Inactive (03/27/2021)   Exercise Vital Sign    Days of Exercise per Week: 0 days    Minutes of Exercise per Session: 0 min  Stress: No Stress Concern Present (03/27/2021)   [place]    Feeling of Stress : Not at all  Social Connections: Moderately Integrated (03/27/2021)   Social Connection and Isolation Panel [NHANES]    Frequency of Communication with Friends and Family: More than three times a week    Frequency of Social Gatherings with Friends and Family: More than three times a week    Attends Religious Services: More than 4 times per year    Active Member of [employer] or Organizations: Yes    Attends Music therapist: More than 4 times per year    Marital Status: Never married   Allergies  Allergen Reactions   Amiodarone Other (See Comments)    hyperthyroidism   Statins Other (See Comments)    myalgia   Tape Other (See Comments)    Skin Tears Use Paper Tape Only   Family History  Problem Relation Age of Onset   COPD Mother    Asthma Mother    Colon polyps Mother    Allergies Mother    Hypothyroidism Mother    Asthma Maternal Grandmother    Colon cancer Neg Hx     Current Outpatient Medications (Endocrine & Metabolic):    dapagliflozin propanediol (FARXIGA) 10 MG TABS tablet, Take 1 tablet (10 mg total) by mouth daily before breakfast.  Current Outpatient Medications (Cardiovascular):    carvedilol (COREG) 6.25 MG tablet, Take 1 tablet (6.25 mg total) by mouth 2 (two) times daily.   eplerenone (INSPRA) 50 MG tablet, Take 1 tablet (50 mg total) by mouth daily.   Evolocumab (REPATHA SURECLICK) 366 MG/ML SOAJ, INJECT 1 PEN INTO THE SKIN EVERY 14 (FOURTEEN) DAYS.   losartan (COZAAR) 25 MG tablet, Take 1 tablet (25 mg total) by mouth daily.   metolazone (ZAROXOLYN) 2.5 MG tablet, Take 1 tablet (2.5 mg total) by mouth as directed. With additional 40 meq of potassium   tadalafil (CIALIS) 20 MG tablet, Take 0.5-1 tablets (10-20 mg total) by mouth every other day as needed for erectile dysfunction.   torsemide (DEMADEX) 20 MG tablet, Take 2 tablets by mouth 2 times daily.  Current Outpatient Medications (Respiratory):    albuterol  (VENTOLIN HFA) 108 (90 Base) MCG/ACT inhaler, INHALE 2 PUFFS BY  MOUTH 4 TIMES A DAY AS NEEDED   fluticasone (FLONASE) 50 MCG/ACT nasal spray, PLACE 2 SPRAYS INTO BOTH NOSTRILS DAILY AS NEEDED FOR ALLERGIES OR RHINITIS.  Current Outpatient Medications (Analgesics):    acetaminophen (TYLENOL) 500 MG tablet, Take 500 mg by mouth every 6 (six) hours as needed for moderate pain.   traMADol (ULTRAM) 50 MG tablet, TAKE 1 TABLET BY MOUTH EVERY DAY AS NEEDED  Current Outpatient Medications (Hematological):    apixaban (ELIQUIS) 5 MG TABS tablet, Take 1 tablet (5 mg total) by mouth 2 (two) times daily.  Current Outpatient Medications (Other):    ALPRAZolam (XANAX) 0.5 MG tablet, TAKE 2 TABLETS BY MOUTH AT BEDTIME   augmented betamethasone dipropionate (DIPROLENE-AF) 0.05 % cream, Apply 1 application topically daily as needed (itching).   carisoprodol (SOMA) 350 MG tablet, TAKE 1 TABLET BY MOUTH THREE TIMES A DAY AS NEEDED FOR MUSCLE SPASMS   cephALEXin (KEFLEX) 500 MG capsule, Take 1 capsule (500 mg total) by mouth 3 (three) times daily.   Cholecalciferol 100 MCG (4000 UT) CAPS, 1 tab by mouth once daily   doxepin (SINEQUAN) 10 MG capsule, TAKE 2 CAPSULES (20 MG TOTAL) BY MOUTH AT BEDTIME AS NEEDED.   Dupilumab (DUPIXENT) 300 MG/2ML SOPN, Inject 300 mg into the skin every 14 (fourteen) days.   fluticasone (CUTIVATE) 0.05 % cream, SMARTSIG:Topical 1-2 Times Daily PRN   hydrOXYzine (ATARAX/VISTARIL) 10 MG tablet, TAKE 1 TABLET BY MOUTH 3 TIMES DAILY AS NEEDED FOR ITCHING.   ketoconazole (NIZORAL) 2 % cream, APPLY 1 APPLICATION TOPICALLY DAILY   lidocaine (LIDODERM) 5 %, APPLY 1 PATCH TO AFFECTED AREA FOR UP TO 12 HOURS. DO NOT USE MORE THAN ONE PATCH IN 24 HOURS.   omeprazole (PRILOSEC) 20 MG capsule, TAKE 1 CAPSULE BY MOUTH TWICE A DAY   Potassium Chloride (KLOR-CON PO), Take 20 mcg by mouth daily.   silver sulfADIAZINE (SILVADENE) 1 % cream, APPLY TOPICALLY TO AFFECTED AREA EVERY DAY   tamsulosin  (FLOMAX) 0.4 MG CAPS capsule, TAKE 1 CAPSULE BY MOUTH EVERY DAY   traZODone (DESYREL) 100 MG tablet, TAKE 2 TABLETS BY MOUTH AT BEDTIME AS NEEDED   TUMS 500 MG chewable tablet, Chew 500-2,000 mg by mouth every 4 (four) hours as needed for indigestion or heartburn.    venlafaxine XR (EFFEXOR-XR) 75 MG 24 hr capsule, TAKE 1 CAPSULE EVERY DAY WITH BREAKFAST   zolpidem (AMBIEN) 10 MG tablet, TAKE 1 TABLET BY MOUTH EVERY DAY AT BEDTIME AS NEEDED   Reviewed prior external information including notes and imaging from  primary care provider As well as notes that were available from care everywhere and other healthcare systems.  Past medical history, social, surgical and family history all reviewed in electronic medical record.  No pertanent information unless stated regarding to the chief complaint.   Review of Systems:  No headache, visual changes, nausea, vomiting, diarrhea, constipation, dizziness, abdominal pain, skin rash, fevers, chills, night sweats, weight loss, swollen lymph nodes, body aches, joint swelling, chest pain, shortness of breath, mood changes. POSITIVE muscle aches  Objective  Blood pressure 116/88, pulse 79, height '5\' 10"'$  (1.778 m), weight 251 lb (113.9 kg), SpO2 95 %.   General: No apparent distress alert and oriented x3 mood and affect normal, dressed appropriately.  HEENT: Pupils equal, extraocular movements intact  Respiratory: Patient's speak in full sentences and does not appear short of breath  Cardiovascular: Trace lower extremity edema, non tender, no erythema  Ambulating with the aid of a cane Patient  does have tenderness to palpation of the shoulders bilaterally.  No crepitus noted. Patient does have some atrophy of the shoulder bilaterally.  Procedure: Real-time Ultrasound Guided Injection of right glenohumeral joint Device: GE Logiq Q7  Ultrasound guided injection is preferred based studies that show increased duration, increased effect, greater accuracy,  decreased procedural pain, increased response rate with ultrasound guided versus blind injection.  Verbal informed consent obtained.  Time-out conducted.  Noted no overlying erythema, induration, or other signs of local infection.  Skin prepped in a sterile fashion.  Local anesthesia: Topical Ethyl chloride.  With sterile technique and under real time ultrasound guidance:  Joint visualized.  23g 1  inch needle inserted posterior approach. Pictures taken for needle placement. Patient did have injection of  2 cc of 0.5% Marcaine, and 1.0 cc of Kenalog 40 mg/dL. Completed without difficulty  Pain immediately resolved suggesting accurate placement of the medication.  Advised to call if fevers/chills, erythema, induration, drainage, or persistent bleeding.  Impression: Technically successful ultrasound guided injection.  Procedure: Real-time Ultrasound Guided Injection of right acromioclavicular joint Device: GE Logiq Q7 Ultrasound guided injection is preferred based studies that show increased duration, increased effect, greater accuracy, decreased procedural pain, increased response rate, and decreased cost with ultrasound guided versus blind injection.  Verbal informed consent obtained.  Time-out conducted.  Noted no overlying erythema, induration, or other signs of local infection.  Skin prepped in a sterile fashion.  Local anesthesia: Topical Ethyl chloride.  With sterile technique and under real time ultrasound guidance: With a 25-gauge half inch needle injected with 0.5 cc of 0.5% Marcaine and 0.5 cc of Kenalog 40 mg/mL Completed without difficulty  Pain immediately resolved suggesting accurate placement of the medication.  Advised to call if fevers/chills, erythema, induration, drainage, or persistent bleeding.  Impression: Technically successful ultrasound guided injection.  Procedure: Real-time Ultrasound Guided Injection of left glenohumeral joint Device: GE Logiq E  Ultrasound  guided injection is preferred based studies that show increased duration, increased effect, greater accuracy, decreased procedural pain, increased response rate with ultrasound guided versus blind injection.  Verbal informed consent obtained.  Time-out conducted.  Noted no overlying erythema, induration, or other signs of local infection.  Skin prepped in a sterile fashion.  Local anesthesia: Topical Ethyl chloride.  With sterile technique and under real time ultrasound guidance:  Joint visualized.  21g 2 inch needle inserted posterior approach. Pictures taken for needle placement. Patient did have injection of 2 cc of 0.5% Marcaine, and 1cc of Kenalog 40 mg/dL. Completed without difficulty  Pain immediately resolved suggesting accurate placement of the medication.  Advised to call if fevers/chills, erythema, induration, drainage, or persistent bleeding.  Impression: Technically successful ultrasound guided injection.   Procedure: Real-time Ultrasound Guided Injection of left acromioclavicular joint Device: GE Logiq Q7 Ultrasound guided injection is preferred based studies that show increased duration, increased effect, greater accuracy, decreased procedural pain, increased response rate, and decreased cost with ultrasound guided versus blind injection.  Verbal informed consent obtained.  Time-out conducted.  Noted no overlying erythema, induration, or other signs of local infection.  Skin prepped in a sterile fashion.  Local anesthesia: Topical Ethyl chloride.  With sterile technique and under real time ultrasound guidance: With a 25-gauge half inch needle injected with 0.5 cc of 0.5% Marcaine and 0.5 cc of Kenalog 40 mg/mL. Completed without difficulty  Pain immediately resolved suggesting accurate placement of the medication.  Advised to call if fevers/chills, erythema, induration, drainage, or persistent bleeding.  Impression: Technically  successful ultrasound guided injection.    Impression and Recommendations:    The above documentation has been reviewed and is accurate and complete Lyndal Pulley, DO

## 2022-04-13 NOTE — Telephone Encounter (Signed)
This encounter was created in error - please disregard.

## 2022-04-23 NOTE — Progress Notes (Signed)
Electrophysiology Office Note Date: 04/30/2022  ID:  [name], DOB [birth year], MRN [id number]  PCP: [name], MD Primary Cardiologist: None Electrophysiologist: Zachary Axe, MD   CC: Routine ICD follow-up  [name] is a 66 y.o. male seen today for Zachary Axe, MD for routine electrophysiology followup.  Since last being seen in our clinic the patient reports doing well overall. Has a wound on his leg that is slow healing. He is on ABx and may end up in wound clinic. Denies fevers or chills. he denies chest pain, palpitations, dyspnea, PND, orthopnea, nausea, vomiting, dizziness, syncope, edema, weight gain, or early satiety. He has not had ICD shocks.   Device History: Medtronic BiV ICD implanted 09/2017 for NICM  Past Medical History:  Diagnosis Date   AICD (automatic cardioverter/defibrillator) present    Anemia    supposed to be taking Vit B but doesn't   ANXIETY    takes Xanax nightly   Arthritis    Asthma    Albuterol prn and Advair daily;also takes Prednisone daily   Atrial fibrillation ([place]) 09/06/2015   Cardiomyopathy ([place])    a. EF 25% TEE July 2013; b. EF normalized 2015;  c. 03/2015 Echo: EF 40-45%, difrf HK, PASP 38 mmHg, Mild MR, sev LAE/RAE.   CHF (congestive heart failure) (HCC)    Chronic constipation    takes OTC stool softener   COPD (chronic obstructive pulmonary disease) (HCC)    "one dr says COPD; one dr says emphysema" (09/18/2017)   DEPRESSION    takes Zoloft and Doxepin daily   Diverticulitis    DYSKINESIA, ESOPHAGUS    Dysrhythmia    Atrial Fibrilation   Emphysema of lung [facility])    "one dr says COPD; one dr says emphysema" (09/18/2017)   Essential hypertension        FIBROMYALGIA    GERD (gastroesophageal reflux disease)        Glaucoma    HYPERLIPIDEMIA    a. Intolerant to statins.   INSOMNIA    takes Ambien nightly   Myocardial infarction [facility])    a. 2012 Myoview notable for prior infarct;  b. 03/2015 Lexiscan CL: EF 37%,  diff HK, small area of inferior infarct from apex to base-->Med Rx.   Myocardial infarction ([place])    O2 dependent    "2.5L q hs & prn" (09/18/2017)   Paroxysmal atrial fibrillation ([place])    a. CHA2DS2VASc = 3--> takes Coumadin;  b. 03/15/2015 Successful TEE/DCCV;  c. 03/2015 recurrent afib, Amio d/c'd in setting of hyperthyroidism.   Peripheral neuropathy    Pneumonia 12/2016   Rash and other nonspecific skin eruption 04/12/2009   no cause found saw dermatologists x 2 and allergist   SLEEP APNEA, OBSTRUCTIVE    a. doesn't use CPAP   Syncope    a. 03/2015 s/p MDT LINQ.   Type II diabetes mellitus ([place])        Past Surgical History:  Procedure Laterality Date   ACNE CYST REMOVAL     2 on back    AV NODE ABLATION N/A 10/25/2017   Procedure: AV NODE ABLATION;  Surgeon: Deboraha Sprang, MD;  Location: [place] [facility];  Service: Cardiovascular;  Laterality: N/A;   BIV ICD INSERTION CRT-D N/A 09/18/2017   Procedure: BIV ICD INSERTION CRT-D;  Surgeon: Deboraha Sprang, MD;  Location: [place] [facility];  Service: Cardiovascular;  Laterality: N/A;   CARDIAC CATHETERIZATION N/A 03/21/2016   Procedure: Right/Left Heart  Cath and Coronary Angiography;  Surgeon: Larey Dresser, MD;  Location: [place] [facility];  Service: Cardiovascular;  Laterality: N/A;   CARDIOVERSION  04/18/2012   Procedure: CARDIOVERSION;  Surgeon: Fay Records, MD;  Location: [place];  Service: Cardiovascular;  Laterality: N/A;   CARDIOVERSION  04/25/2012   Procedure: CARDIOVERSION;  Surgeon: Thayer Headings, MD;  Location: [place];  Service: Cardiovascular;  Laterality: N/A;   CARDIOVERSION  04/25/2012   Procedure: CARDIOVERSION;  Surgeon: Fay Records, MD;  Location: [place];  Service: Cardiovascular;  Laterality: N/A;   CARDIOVERSION  05/09/2012   Procedure: CARDIOVERSION;  Surgeon: Sherren Mocha, MD;  Location: [place];  Service: Cardiovascular;  Laterality: N/A;  changed from crenshaw to cooper by trish/leone-endo    CARDIOVERSION N/A 03/15/2015   Procedure: CARDIOVERSION;  Surgeon: Thayer Headings, MD;  Location: [place];  Service: Cardiovascular;  Laterality: N/A;   COLONOSCOPY     COLONOSCOPY WITH PROPOFOL N/A 10/21/2014   Procedure: COLONOSCOPY WITH PROPOFOL;  Surgeon: Ladene Artist, MD;  Location: [facility];  Service: Endoscopy;  Laterality: N/A;   EP IMPLANTABLE DEVICE N/A 04/06/2015   Procedure: Loop Recorder Insertion;  Surgeon: Evans Lance, MD;  Location: [place] [facility];  Service: Cardiovascular;  Laterality: N/A;   ESOPHAGOGASTRODUODENOSCOPY     JOINT REPLACEMENT     LOOP RECORDER REMOVAL N/A 09/18/2017   Procedure: LOOP RECORDER REMOVAL;  Surgeon: Deboraha Sprang, MD;  Location: [place] [facility];  Service: Cardiovascular;  Laterality: N/A;   RIGHT/LEFT HEART CATH AND CORONARY ANGIOGRAPHY N/A 01/28/2017   Procedure: Right/Left Heart Cath and Coronary Angiography;  Surgeon: Larey Dresser, MD;  Location: [place] [facility];  Service: Cardiovascular;  Laterality: N/A;   TEE WITHOUT CARDIOVERSION  04/25/2012   Procedure: TRANSESOPHAGEAL ECHOCARDIOGRAM (TEE);  Surgeon: Thayer Headings, MD;  Location: [place];  Service: Cardiovascular;  Laterality: N/A;   TEE WITHOUT CARDIOVERSION N/A 03/15/2015   Procedure: TRANSESOPHAGEAL ECHOCARDIOGRAM (TEE);  Surgeon: Thayer Headings, MD;  Location: [place];  Service: Cardiovascular;  Laterality: N/A;   TONSILLECTOMY AND ADENOIDECTOMY     TOTAL KNEE ARTHROPLASTY Right 06/15/2014   Procedure: TOTAL KNEE ARTHROPLASTY;  Surgeon: Renette Butters, MD;  Location: [place];  Service: Orthopedics;  Laterality: Right;   TOTAL KNEE ARTHROPLASTY Left 10/17/2021   Procedure: Left TOTAL KNEE ARTHROPLASTY;  Surgeon: Mcarthur Rossetti, MD;  Location: [place];  Service: Orthopedics;  Laterality: Left;    Current Outpatient Medications  Medication Sig Dispense Refill   acetaminophen (TYLENOL) 500 MG tablet Take 500 mg by mouth every 6 (six) hours as needed  for moderate pain.     albuterol (VENTOLIN HFA) 108 (90 Base) MCG/ACT inhaler INHALE 2 PUFFS BY MOUTH 4 TIMES A DAY AS NEEDED 54 each 3   ALPRAZolam (XANAX) 0.5 MG tablet TAKE 2 TABLETS BY MOUTH AT BEDTIME 60 tablet 5   apixaban (ELIQUIS) 5 MG TABS tablet Take 1 tablet (5 mg total) by mouth 2 (two) times daily. 60 tablet 11   augmented betamethasone dipropionate (DIPROLENE-AF) 0.05 % cream Apply 1 application topically daily as needed (itching).     carisoprodol (SOMA) 350 MG tablet TAKE 1 TABLET BY MOUTH THREE TIMES A DAY AS NEEDED FOR MUSCLE SPASMS 90 tablet 2   carvedilol (COREG) 6.25 MG tablet Take 1 tablet (6.25 mg total) by mouth 2 (two) times daily. 60 tablet 11   cephALEXin (KEFLEX) 500 MG capsule Take 1 capsule (500 mg total)  by mouth 3 (three) times daily. 30 capsule 0   Cholecalciferol 100 MCG (4000 UT) CAPS 1 tab by mouth once daily 30 capsule 99   dapagliflozin propanediol (FARXIGA) 10 MG TABS tablet Take 1 tablet (10 mg total) by mouth daily before breakfast. 90 tablet 3   doxepin (SINEQUAN) 10 MG capsule TAKE 2 CAPSULES (20 MG TOTAL) BY MOUTH AT BEDTIME AS NEEDED. 180 capsule 1   Dupilumab (DUPIXENT) 300 MG/2ML SOPN Inject 300 mg into the skin every 14 (fourteen) days.     eplerenone (INSPRA) 50 MG tablet Take 1 tablet (50 mg total) by mouth daily. 90 tablet 3   Evolocumab (REPATHA SURECLICK) 147 MG/ML SOAJ INJECT 1 PEN INTO THE SKIN EVERY 14 (FOURTEEN) DAYS. 6 mL 3   fluticasone (CUTIVATE) 0.05 % cream SMARTSIG:Topical 1-2 Times Daily PRN     fluticasone (FLONASE) 50 MCG/ACT nasal spray PLACE 2 SPRAYS INTO BOTH NOSTRILS DAILY AS NEEDED FOR ALLERGIES OR RHINITIS. 48 mL 1   hydrOXYzine (ATARAX/VISTARIL) 10 MG tablet TAKE 1 TABLET BY MOUTH 3 TIMES DAILY AS NEEDED FOR ITCHING. 60 tablet 2   ketoconazole (NIZORAL) 2 % cream APPLY 1 APPLICATION TOPICALLY DAILY 30 g 1   lidocaine (LIDODERM) 5 % APPLY 1 PATCH TO AFFECTED AREA FOR UP TO 12 HOURS. DO NOT USE MORE THAN ONE PATCH IN 24 HOURS.  90 patch 2   losartan (COZAAR) 25 MG tablet Take 1 tablet (25 mg total) by mouth daily. 30 tablet 11   metolazone (ZAROXOLYN) 2.5 MG tablet Take 1 tablet (2.5 mg total) by mouth as directed. With additional 40 meq of potassium 5 tablet 0   omeprazole (PRILOSEC) 20 MG capsule TAKE 1 CAPSULE BY MOUTH TWICE A DAY 180 capsule 1   Potassium Chloride (KLOR-CON PO) Take 20 mcg by mouth daily.     silver sulfADIAZINE (SILVADENE) 1 % cream APPLY TOPICALLY TO AFFECTED AREA EVERY DAY 50 g 3   tadalafil (CIALIS) 20 MG tablet Take 0.5-1 tablets (10-20 mg total) by mouth every other day as needed for erectile dysfunction. 10 tablet 11   tamsulosin (FLOMAX) 0.4 MG CAPS capsule TAKE 1 CAPSULE BY MOUTH EVERY DAY 90 capsule 3   torsemide (DEMADEX) 20 MG tablet Take 2 tablets by mouth 2 times daily. 60 tablet 3   traMADol (ULTRAM) 50 MG tablet TAKE 1 TABLET BY MOUTH EVERY DAY AS NEEDED 30 tablet 2   traZODone (DESYREL) 100 MG tablet TAKE 2 TABLETS BY MOUTH AT BEDTIME AS NEEDED 180 tablet 1   TUMS 500 MG chewable tablet Chew 500-2,000 mg by mouth every 4 (four) hours as needed for indigestion or heartburn.      venlafaxine XR (EFFEXOR-XR) 75 MG 24 hr capsule TAKE 1 CAPSULE EVERY DAY WITH BREAKFAST 90 capsule 3   zolpidem (AMBIEN) 10 MG tablet TAKE 1 TABLET BY MOUTH EVERY DAY AT BEDTIME AS NEEDED 90 tablet 1   No current facility-administered medications for this visit.    Allergies:   Amiodarone, Statins, and Tape   Social History: Social History   Socioeconomic History   Marital status: Divorced    Spouse name: Not on file   Number of children: 2   Years of education: Not on file   Highest education level: Not on file  Occupational History   Occupation: retired/disabled. prev worked in Therapist, sports.    Employer: DISABLED  Tobacco Use   Smoking status: Former    Packs/day: 2.00    Years: 30.00    Total  pack years: 60.00    Types: Cigarettes    Quit date: 10/16/2007    Years since quitting: 14.5    Smokeless tobacco: Never  Vaping Use   Vaping Use: Never used  Substance and Sexual Activity   Alcohol use: No   Drug use: No   Sexual activity: Not Currently  Other Topics Concern   Not on file  Social History Narrative   Lives alone.   Social Determinants of Health   Financial Resource Strain: Low Risk  (03/27/2021)   Overall Financial Resource Strain (CARDIA)    Difficulty of Paying Living Expenses: Not hard at all  Food Insecurity: No Food Insecurity (04/27/2022)   Hunger Vital Sign    Worried About Running Out of Food in the Last Year: Never true    Ran Out of Food in the Last Year: Never true  Transportation Needs: No Transportation Needs (04/27/2022)   PRAPARE - Hydrologist (Medical): No    Lack of Transportation (Non-Medical): No  Physical Activity: Inactive (03/27/2021)   Exercise Vital Sign    Days of Exercise per Week: 0 days    Minutes of Exercise per Session: 0 min  Stress: No Stress Concern Present (03/27/2021)   [place]    Feeling of Stress : Not at all  Social Connections: Moderately Integrated (03/27/2021)   Social Connection and Isolation Panel [NHANES]    Frequency of Communication with Friends and Family: More than three times a week    Frequency of Social Gatherings with Friends and Family: More than three times a week    Attends Religious Services: More than 4 times per year    Active Member of [employer] or Organizations: Yes    Attends Archivist Meetings: More than 4 times per year    Marital Status: Never married  Intimate Partner Violence: Not At Risk (03/27/2021)   Humiliation, Afraid, Rape, and Kick questionnaire    Fear of Current or Ex-Partner: No    Emotionally Abused: No    Physically Abused: No    Sexually Abused: No    Family History: Family History  Problem Relation Age of Onset   COPD Mother    Asthma Mother    Colon polyps Mother     Allergies Mother    Hypothyroidism Mother    Asthma Maternal Grandmother    Colon cancer Neg Hx     Review of Systems: All other systems reviewed and are otherwise negative except as noted above.   Physical Exam: Vitals:   04/30/22 1048  BP: 110/66  Pulse: 75  SpO2: 95%  Weight: 254 lb (115.2 kg)  Height: '5\' 9"'$  (1.753 m)     GEN- The patient is well appearing, alert and oriented x 3 today.   HEENT: normocephalic, atraumatic; sclera clear, conjunctiva pink; hearing intact; oropharynx clear; neck supple, no JVP Lymph- no cervical lymphadenopathy Lungs- Clear to ausculation bilaterally, normal work of breathing.  No wheezes, rales, rhonchi Heart- Regular rate and rhythm, no murmurs, rubs or gallops, PMI not laterally displaced GI- soft, non-tender, non-distended, bowel sounds present, no hepatosplenomegaly Extremities- no clubbing or cyanosis. No edema; DP/PT/radial pulses 2+ bilaterally MS- no significant deformity or atrophy Skin- warm and dry, no rash or lesion; ICD pocket well healed Psych- euthymic mood, full affect Neuro- strength and sensation are intact  ICD interrogation- reviewed in detail today,  See PACEART report  EKG:  EKG is ordered today.  Personal review of EKG ordered today shows AF with VP at 77 bpm  Recent Labs: 05/10/2021: Magnesium 2.0 12/15/2021: B Natriuretic Peptide 72.2 12/20/2021: ALT 15; TSH 1.09 03/20/2022: Hemoglobin 13.4; Platelets 188 03/30/2022: BUN 29; Creatinine, Ser 1.37; Potassium 4.4; Sodium 140   Wt Readings from Last 3 Encounters:  04/30/22 254 lb (115.2 kg)  04/24/22 251 lb (113.9 kg)  03/20/22 247 lb 6.4 oz (112.2 kg)     Other studies Reviewed: Additional studies/ records that were reviewed today include: Previous EP office notes.   Assessment and Plan:  1.  Chronic systolic dysfunction s/p Medtronic CRT-D  euvolemic today Stable on an appropriate medical regimen Normal ICD function See Pace Art report No changes  today  2. Permanent AF Rates controlled  Continue eliquis CHA2DS2VASC is at least 5  3. CAD Denies s/s ischemia  Current medicines are reviewed at length with the patient today.      Disposition:   Follow up with Dr. [name] in 6 months    Signed, Shirley Friar, PA-C  04/30/2022 11:07 AM  [place] [address] [place] [address] [phone] (office) [phone] (fax)

## 2022-04-24 ENCOUNTER — Ambulatory Visit (INDEPENDENT_AMBULATORY_CARE_PROVIDER_SITE_OTHER): Payer: Medicare HMO | Admitting: Family Medicine

## 2022-04-24 ENCOUNTER — Ambulatory Visit: Payer: Medicare HMO | Admitting: Podiatry

## 2022-04-24 ENCOUNTER — Ambulatory Visit: Payer: Self-pay

## 2022-04-24 VITALS — BP 116/88 | HR 79 | Ht 70.0 in | Wt 251.0 lb

## 2022-04-24 DIAGNOSIS — B351 Tinea unguium: Secondary | ICD-10-CM

## 2022-04-24 DIAGNOSIS — M19011 Primary osteoarthritis, right shoulder: Secondary | ICD-10-CM | POA: Diagnosis not present

## 2022-04-24 DIAGNOSIS — M19019 Primary osteoarthritis, unspecified shoulder: Secondary | ICD-10-CM | POA: Insufficient documentation

## 2022-04-24 DIAGNOSIS — L03115 Cellulitis of right lower limb: Secondary | ICD-10-CM | POA: Diagnosis not present

## 2022-04-24 DIAGNOSIS — E119 Type 2 diabetes mellitus without complications: Secondary | ICD-10-CM

## 2022-04-24 DIAGNOSIS — M12812 Other specific arthropathies, not elsewhere classified, left shoulder: Secondary | ICD-10-CM | POA: Diagnosis not present

## 2022-04-24 DIAGNOSIS — M12811 Other specific arthropathies, not elsewhere classified, right shoulder: Secondary | ICD-10-CM | POA: Diagnosis not present

## 2022-04-24 DIAGNOSIS — M19012 Primary osteoarthritis, left shoulder: Secondary | ICD-10-CM | POA: Diagnosis not present

## 2022-04-24 DIAGNOSIS — M25511 Pain in right shoulder: Secondary | ICD-10-CM

## 2022-04-24 DIAGNOSIS — M25512 Pain in left shoulder: Secondary | ICD-10-CM | POA: Diagnosis not present

## 2022-04-24 DIAGNOSIS — M79674 Pain in right toe(s): Secondary | ICD-10-CM

## 2022-04-24 DIAGNOSIS — M79675 Pain in left toe(s): Secondary | ICD-10-CM

## 2022-04-24 MED ORDER — CEPHALEXIN 500 MG PO CAPS
500.0000 mg | ORAL_CAPSULE | Freq: Three times a day (TID) | ORAL | 0 refills | Status: DC
Start: 2022-04-24 — End: 2022-05-07

## 2022-04-24 NOTE — Patient Instructions (Signed)
Good to see you! Call us if any redness or swelling See you again in 8-10 weeks

## 2022-04-24 NOTE — Assessment & Plan Note (Signed)
Bilateral injections given today, tolerated the procedure well, discussed with patient worsening symptoms again.  We will still need to consider the possibility of surgical intervention at some point.  Patient wants to continue with this at the moment.  Discussed the signs and symptoms of any type of infectious etiology.  Follow-up otherwise in 8 to 10 weeks.

## 2022-04-24 NOTE — Assessment & Plan Note (Signed)
Chronic problem with exacerbation.  Has had known arthropathy for quite some time of the shoulders.  Now seems to be having more pain in the decreased range of motion and on ultrasound did have some hypoechoic changes.  Discussed with patient about icing regimen, home exercises.  We will follow-up again in 8 to 10 weeks patient still wants to avoid surgical intervention

## 2022-04-25 ENCOUNTER — Ambulatory Visit (INDEPENDENT_AMBULATORY_CARE_PROVIDER_SITE_OTHER): Payer: Medicare HMO

## 2022-04-25 DIAGNOSIS — I442 Atrioventricular block, complete: Secondary | ICD-10-CM | POA: Diagnosis not present

## 2022-04-25 LAB — CUP PACEART REMOTE DEVICE CHECK
Battery Remaining Longevity: 27 mo
Battery Voltage: 2.94 V
Brady Statistic AP VP Percent: 0 %
Brady Statistic AP VS Percent: 0 %
Brady Statistic AS VP Percent: 0 %
Brady Statistic AS VS Percent: 0 %
Brady Statistic RA Percent Paced: 0 %
Brady Statistic RV Percent Paced: 99.87 %
Date Time Interrogation Session: 20230712061805
HighPow Impedance: 84 Ohm
Implantable Lead Implant Date: 20181205
Implantable Lead Implant Date: 20181205
Implantable Lead Location: 753858
Implantable Lead Location: 753860
Implantable Lead Model: 4398
Implantable Pulse Generator Implant Date: 20181205
Lead Channel Impedance Value: 1121 Ohm
Lead Channel Impedance Value: 1121 Ohm
Lead Channel Impedance Value: 1178 Ohm
Lead Channel Impedance Value: 1178 Ohm
Lead Channel Impedance Value: 256.5 Ohm
Lead Channel Impedance Value: 306.269
Lead Channel Impedance Value: 306.269
Lead Channel Impedance Value: 309.309
Lead Channel Impedance Value: 309.309
Lead Channel Impedance Value: 361 Ohm
Lead Channel Impedance Value: 4047 Ohm
Lead Channel Impedance Value: 418 Ohm
Lead Channel Impedance Value: 513 Ohm
Lead Channel Impedance Value: 513 Ohm
Lead Channel Impedance Value: 760 Ohm
Lead Channel Impedance Value: 779 Ohm
Lead Channel Impedance Value: 836 Ohm
Lead Channel Impedance Value: 931 Ohm
Lead Channel Pacing Threshold Amplitude: 0.375 V
Lead Channel Pacing Threshold Pulse Width: 0.4 ms
Lead Channel Sensing Intrinsic Amplitude: 13 mV
Lead Channel Sensing Intrinsic Amplitude: 13 mV
Lead Channel Setting Pacing Amplitude: 2.25 V
Lead Channel Setting Pacing Amplitude: 2.5 V
Lead Channel Setting Pacing Pulse Width: 0.4 ms
Lead Channel Setting Pacing Pulse Width: 0.6 ms
Lead Channel Setting Sensing Sensitivity: 0.3 mV

## 2022-04-25 NOTE — Progress Notes (Signed)
  Subjective:  Patient ID: [name], male    DOB: Jan 26, 1956,  MRN: [id number]  Chief Complaint  Patient presents with   Nail Problem    Nail trim      66 y.o. male returns with the above complaint. History confirmed with patient.  His nails are thickened elongated and causing pain again.  He has a new issue on the right leg  Objective:  Physical Exam: warm, good capillary refill, no trophic changes or ulcerative lesions, normal sensory exam, +2 DP and PT reduced bilateral and venous stasis dermatitis noted.  Moderate to severe hallux abductovalgus with hammertoe contractures 2 through 5 bilaterally, right worse than left.  Thickened elongated dystrophic mycotic nails x10 with pincer nail deformity.  Small hematoma medial right leg appears to be healing there is mild cellulitis surrounding this  Summary:  Right: Resting right ankle-brachial index indicates noncompressible right  lower extremity arteries. The right toe-brachial index is normal. ABIs are  unreliable.   Left: Resting left ankle-brachial index is within normal range. No  evidence of significant left lower extremity arterial disease. The left  toe-brachial index is normal.       *See table(s) above for measurements and observations.         Radiographs: X-ray of the right foot: Moderate hallux abductovalgus with the presence of metatarsus adductus deformity and pes planus, no increased change in alignment since last radiographs Assessment:   1. Pain due to onychomycosis of toenails of both feet   2. Type 2 diabetes mellitus without complication, without long-term current use of insulin (HCC)   3. Cellulitis of right leg        Plan:  Patient was evaluated and treated and all questions answered.  Patient educated on diabetes. Discussed proper diabetic foot care and discussed risks and complications of disease. Educated patient in depth on reasons to return to the office immediately should he/she discover  anything concerning or new on the feet. All questions answered. Discussed proper shoes as well.  Discussed the etiology and treatment options for the condition in detail with the patient. Educated patient on the topical and oral treatment options for mycotic nails. Recommended debridement of the nails today. Sharp and mechanical debridement performed of all painful and mycotic nails today. Nails debrided in length and thickness using a nail nipper to level of comfort. Discussed treatment options including appropriate shoe gear. Follow up as needed for painful nails.  Ingrowing corners of hallux nails debrided and slant back fashion  Discussed with him the cellulitis on the right leg.  He has ointment he will be placed on the hematoma at home and appears to be healing well and he is used to this being on Eliquis for so long.  Discussed with him if it does not heal within the next 2 to 3 weeks he should let me know and I will refer him to the wound care center.  Rx for Keflex sent.  Advised on signs and symptoms of worsening including spread of the cellulitis, purulent drainage, or systemic symptoms such as nausea fever vomiting chills.  Return in about 10 weeks (around 07/03/2022) for at risk diabetic foot care.

## 2022-04-27 ENCOUNTER — Ambulatory Visit: Payer: Medicare HMO | Admitting: *Deleted

## 2022-04-27 DIAGNOSIS — I4819 Other persistent atrial fibrillation: Secondary | ICD-10-CM

## 2022-04-27 DIAGNOSIS — I255 Ischemic cardiomyopathy: Secondary | ICD-10-CM

## 2022-04-27 NOTE — Chronic Care Management (AMB) (Signed)
Care Management    RN Visit Note  04/27/2022 Name: [name] MRN: [id number] DOB: 04/10/1956  Subjective: [name] is a 66 y.o. year old male who is a primary care patient of [name], MD. The care management team was consulted for assistance with disease management and care coordination needs.    Engaged with patient by telephone for follow up visit/ RN CM case closure in response to provider referral for case management and/or care coordination services.   Consent to Services:   Mr. Haq was given information about Care Management services 03/21/21 including:  Care Management services includes personalized support from designated clinical staff supervised by his physician, including individualized plan of care and coordination with other care providers 24/7 contact phone numbers for assistance for urgent and routine care needs. The patient may stop case management services at any time by phone call to the office staff.  Patient agreed to services and consent obtained.   Assessment: Review of patient past medical history, allergies, medications, health status, including review of consultants reports, laboratory and other test data, was performed as part of comprehensive evaluation and provision of chronic care management services.   SDOH (Social Determinants of Health) assessments and interventions performed:  SDOH Interventions    Flowsheet Row Most Recent Value  SDOH Interventions   Food Insecurity Interventions Intervention Not Indicated  [continues to deny food insecurity]  Housing Interventions Intervention Not Indicated  [confirmed that patient was provided resources for new housing options by [employer],  denies need for additional resources today]  Transportation Interventions Intervention Not Indicated  [continues to drive self]     Care Plan  Allergies  Allergen Reactions   Amiodarone Other (See Comments)    hyperthyroidism   Statins  Other (See Comments)    myalgia   Tape Other (See Comments)    Skin Tears Use Paper Tape Only   Outpatient Encounter Medications as of 04/27/2022  Medication Sig   acetaminophen (TYLENOL) 500 MG tablet Take 500 mg by mouth every 6 (six) hours as needed for moderate pain.   albuterol (VENTOLIN HFA) 108 (90 Base) MCG/ACT inhaler INHALE 2 PUFFS BY MOUTH 4 TIMES A DAY AS NEEDED   ALPRAZolam (XANAX) 0.5 MG tablet TAKE 2 TABLETS BY MOUTH AT BEDTIME   apixaban (ELIQUIS) 5 MG TABS tablet Take 1 tablet (5 mg total) by mouth 2 (two) times daily.   augmented betamethasone dipropionate (DIPROLENE-AF) 0.05 % cream Apply 1 application topically daily as needed (itching).   carisoprodol (SOMA) 350 MG tablet TAKE 1 TABLET BY MOUTH THREE TIMES A DAY AS NEEDED FOR MUSCLE SPASMS   carvedilol (COREG) 6.25 MG tablet Take 1 tablet (6.25 mg total) by mouth 2 (two) times daily.   cephALEXin (KEFLEX) 500 MG capsule Take 1 capsule (500 mg total) by mouth 3 (three) times daily.   Cholecalciferol 100 MCG (4000 UT) CAPS 1 tab by mouth once daily   dapagliflozin propanediol (FARXIGA) 10 MG TABS tablet Take 1 tablet (10 mg total) by mouth daily before breakfast.   doxepin (SINEQUAN) 10 MG capsule TAKE 2 CAPSULES (20 MG TOTAL) BY MOUTH AT BEDTIME AS NEEDED.   Dupilumab (DUPIXENT) 300 MG/2ML SOPN Inject 300 mg into the skin every 14 (fourteen) days.   eplerenone (INSPRA) 50 MG tablet Take 1 tablet (50 mg total) by mouth daily.   Evolocumab (REPATHA SURECLICK) 950 MG/ML SOAJ INJECT 1 PEN INTO THE SKIN EVERY 14 (FOURTEEN) DAYS.   fluticasone (CUTIVATE)  0.05 % cream SMARTSIG:Topical 1-2 Times Daily PRN   fluticasone (FLONASE) 50 MCG/ACT nasal spray PLACE 2 SPRAYS INTO BOTH NOSTRILS DAILY AS NEEDED FOR ALLERGIES OR RHINITIS.   hydrOXYzine (ATARAX/VISTARIL) 10 MG tablet TAKE 1 TABLET BY MOUTH 3 TIMES DAILY AS NEEDED FOR ITCHING.   ketoconazole (NIZORAL) 2 % cream APPLY 1 APPLICATION TOPICALLY DAILY   lidocaine (LIDODERM) 5 %  APPLY 1 PATCH TO AFFECTED AREA FOR UP TO 12 HOURS. DO NOT USE MORE THAN ONE PATCH IN 24 HOURS.   losartan (COZAAR) 25 MG tablet Take 1 tablet (25 mg total) by mouth daily.   metolazone (ZAROXOLYN) 2.5 MG tablet Take 1 tablet (2.5 mg total) by mouth as directed. With additional 40 meq of potassium   omeprazole (PRILOSEC) 20 MG capsule TAKE 1 CAPSULE BY MOUTH TWICE A DAY   Potassium Chloride (KLOR-CON PO) Take 20 mcg by mouth daily.   silver sulfADIAZINE (SILVADENE) 1 % cream APPLY TOPICALLY TO AFFECTED AREA EVERY DAY   tadalafil (CIALIS) 20 MG tablet Take 0.5-1 tablets (10-20 mg total) by mouth every other day as needed for erectile dysfunction.   tamsulosin (FLOMAX) 0.4 MG CAPS capsule TAKE 1 CAPSULE BY MOUTH EVERY DAY   torsemide (DEMADEX) 20 MG tablet Take 2 tablets by mouth 2 times daily.   traMADol (ULTRAM) 50 MG tablet TAKE 1 TABLET BY MOUTH EVERY DAY AS NEEDED   traZODone (DESYREL) 100 MG tablet TAKE 2 TABLETS BY MOUTH AT BEDTIME AS NEEDED   TUMS 500 MG chewable tablet Chew 500-2,000 mg by mouth every 4 (four) hours as needed for indigestion or heartburn.    venlafaxine XR (EFFEXOR-XR) 75 MG 24 hr capsule TAKE 1 CAPSULE EVERY DAY WITH BREAKFAST   zolpidem (AMBIEN) 10 MG tablet TAKE 1 TABLET BY MOUTH EVERY DAY AT BEDTIME AS NEEDED   No facility-administered encounter medications on file as of 04/27/2022.   Patient Active Problem List   Diagnosis Date Noted   AC (acromioclavicular) arthritis 04/24/2022   ED (erectile dysfunction) 01/03/2022   Intertrigo 12/24/2021   Gluteal tendinitis of left buttock 12/19/2021   Unilateral primary osteoarthritis, left knee 10/17/2021   Status post total knee replacement, left 10/17/2021   Rib pain on right side 10/10/2021   History of colonic polyps 09/26/2021   Acute cough 07/21/2021   Bursitis of left hip 06/13/2021   Tooth fracture 06/11/2021   Pain and swelling of right lower leg 05/18/2021   Hemorrhagic shock ([place]) 05/05/2021    Gastrointestinal hemorrhage    Diverticulitis of colon    Preop exam for internal medicine 03/04/2021   B12 deficiency 03/04/2021   Vitamin D deficiency 03/01/2021   Myalgia 12/01/2020   Laceration of left leg 09/17/2020   Statin myopathy 08/02/2020   Rotator cuff arthropathy of both shoulders 02/23/2020   (HFpEF) heart failure with preserved ejection fraction ([place]) 02/22/2020   ICD (implantable cardioverter-defibrillator) in place 12/27/2019   COVID-19 virus infection 12/09/2019   COVID-19 12/09/2019   Hypotension 12/09/2019   Upper respiratory infection 11/27/2019   COPD exacerbation ([place]) 11/27/2019   Elevated PSA 09/23/2019   Lip lesion 12/23/2018   Diabetic ulcer of left lower leg with necrosis of muscle ([place]) 12/05/2018   Chronic bursitis of left shoulder 07/01/2018   Pain due to total knee replacement ([place]) 03/31/2018   Rotator cuff arthropathy, left 02/13/2018   Right rotator cuff tear arthropathy 01/14/2018   Increased prostate specific antigen (PSA) velocity 09/26/2017   Trigger point of thoracic region 07/04/2017  Right lumbar radiculopathy 05/15/2017   Right leg swelling 05/09/2017   Glaucoma 04/18/2017   History of nasal polyposis 02/19/2017   Chronic rhinitis 02/19/2017   Microhematuria 12/04/2016   Hyperlipidemia 08/15/2016   Encounter for well adult exam with abnormal findings 11/29/2015   Persistent atrial fibrillation ([place]) 03/30/2015   Syncope 03/30/2015   Acute on chronic diastolic heart failure ([place]) 03/30/2015   Former tobacco use 02/25/2015   Type 2 diabetes mellitus ([place]) 02/25/2015   Atrial fibrillation ([place]) 02/25/2015   Anemia 02/23/2015   Thyrotoxicosis 02/23/2015   Hx of adenomatous colonic polyps 10/21/2014   De Quervain's tenosynovitis, right 04/30/2014   Osteoarthritis of right knee 02/19/2014   Encounter for therapeutic drug monitoring 11/17/2013   Thoracic degenerative disc disease 06/17/2013   Thoracic spondylosis without myelopathy  02/11/2013   Lumbosacral spondylosis without myelopathy 02/11/2013   Degenerative joint disease of knee, left 02/11/2013   COPD (chronic obstructive pulmonary disease) ([place]) 08/20/2012   Bundle branch block, right and extreme left anterior fascicular 05/05/2012   Nonischemic cardiomyopathy ([place]) 05/02/2012   Other primary cardiomyopathies 05/02/2012   Anticoagulated on warfarin 03/21/2012   Spongiotic dermatitis 09/26/2011   Past myocardial infarction 04/19/2011   UNSPECIFIED URETHRAL STRICTURE 12/26/2010   Chronic pain syndrome 05/18/2010   Obstructive sleep apnea 10/21/2007   NEPHROLITHIASIS, HX OF 10/21/2007   Anxiety 06/09/2007   GERD (gastroesophageal reflux disease) 06/09/2007   Benign prostatic hyperplasia 06/09/2007   Morbid obesity ([place]) 06/03/2007   Depression 06/03/2007   Essential hypertension 06/03/2007   INSOMNIA 06/03/2007   Conditions to be addressed/monitored: Atrial Fibrillation and CHF  Care Plan : RN Care Manager Plan of Care  Updates made by Knox Royalty, RN since 04/27/2022 12:00 AM     Problem: Chronic Disease Management Needs   Priority: High     Long-Range Goal: Ongoing adherence to established plan of care for long term chronic disease management   Start Date: 11/20/2021  Expected End Date: 11/20/2022  Priority: High  Note:   Current Barriers:  Chronic Disease Management support and education needs related to Atrial Fibrillation and CHF Fragile state of health, multiple progressing chronic health conditions Hospitalization July 22-27, 2022 for GI bleeding  RNCM Clinical Goal(s):  Patient will demonstrate ongoing health management independence as evidenced by adherence to established plan of care for A-F and CHF        through collaboration with RN Care manager, provider, and care team  Interventions: 1:1 collaboration with primary care provider regarding development and update of comprehensive plan of care as evidenced by provider attestation and  co-signature Inter-disciplinary care team collaboration (see longitudinal plan of care) Evaluation of current treatment plan related to  self management and patient's adherence to plan as established by provider Review of patient status, including review of consultants reports, relevant laboratory and other test results, and medications completed SDOH updated: no new/ unmet concerns identified: patient confirms he spoke with [employer] and received resources around affordable housing, as he is planning to move soon; states he has been approved for "new Architect section 8 housing" and is on wait list; currently continues residing with his daughter/ son-in-law  Medications discussed: reports continues to independently self-manage and denies current concerns/ issues/ questions around medications; endorses adherence to taking all medications as prescribed Reviewed upcoming scheduled provider appointments:  05/30/22- orthopedic provider; 06/26/22- sports medicine provider; 06/27/22- PCP; patient confirms is aware of all and has plans to attend as scheduled Discussed plans with patient for  ongoing care management follow up- patient denies current care coordination/ care management needs and is agreeable to CCM RN CM case closure today; verbalizes understanding to contact PCP or other care providers for any needs that arise in the future, and confirms he has contact information for all care providers     Heart Failure Interventions:  (Status: 04/27/22: Goal Met.)  Long Term Goal  Wt Readings from Last 3 Encounters:  04/24/22 251 lb (113.9 kg)  03/20/22 247 lb 6.4 oz (112.2 kg)  02/20/22 242 lb (109.8 kg)  Basic overview and discussion of pathophysiology of Heart Failure reviewed Provided education on low sodium diet Discussed importance of daily weight and advised patient to weigh and record daily Reviewed role of diuretics in prevention of fluid overload and management of heart  failure Discussed the importance of keeping all appointments with provider Provided patient with education about the role of exercise in the management of heart failure Confirmed patient continues to monitor/ record daily weights at home: he reports ongoing consistent weight ranges between 245-248 lbs and denies weight gain > 3 lbs overnight/ 5 lbs one week Reinforced previously provided education around signs/ symptoms CHF yellow zone along with corresponding action plan- he verbalizes a good understanding of same and will benefit from ongoing education, support, and reinforcement Confirmed patient continues following heart healthy low salt diet, "as much as possible"- positive reinforcement provided with encouragement to continue efforts  AFIB Interventions: (Status: 04/27/22: Goal Met.) Long Term Goal Reviewed importance of adherence to anticoagulant exactly as prescribed Signs/ symptoms development of A-F and Afib action plan reviewed: patient denies recent signs/ symptoms A-Fib; confirms he continues to be followed closely by remote cardiac device team Reinforced previously provided education around signs/ symptoms and corresponding action plan for AF- he verbalizes very good understanding of same     Plan:  No further follow up required: patient denies care coordination/ care management needs and is agreeable to RN CM case closure today; RN CM case closure accoridngly     [name], RN, BSN, [place] [phone]: direct office

## 2022-04-30 ENCOUNTER — Encounter: Payer: Self-pay | Admitting: Student

## 2022-04-30 ENCOUNTER — Ambulatory Visit: Payer: Medicare HMO | Admitting: Student

## 2022-04-30 VITALS — BP 110/66 | HR 75 | Ht 69.0 in | Wt 254.0 lb

## 2022-04-30 DIAGNOSIS — Z95810 Presence of automatic (implantable) cardiac defibrillator: Secondary | ICD-10-CM

## 2022-04-30 DIAGNOSIS — I5022 Chronic systolic (congestive) heart failure: Secondary | ICD-10-CM | POA: Diagnosis not present

## 2022-04-30 DIAGNOSIS — I4821 Permanent atrial fibrillation: Secondary | ICD-10-CM

## 2022-04-30 LAB — CUP PACEART INCLINIC DEVICE CHECK
Battery Remaining Longevity: 27 mo
Battery Voltage: 2.9 V
Brady Statistic AP VP Percent: 0 %
Brady Statistic AP VS Percent: 0 %
Brady Statistic AS VP Percent: 0 %
Brady Statistic AS VS Percent: 0 %
Brady Statistic RA Percent Paced: 0 %
Brady Statistic RV Percent Paced: 99.71 %
Date Time Interrogation Session: 20230717110655
HighPow Impedance: 81 Ohm
Implantable Lead Implant Date: 20181205
Implantable Lead Implant Date: 20181205
Implantable Lead Location: 753858
Implantable Lead Location: 753860
Implantable Lead Model: 4398
Implantable Pulse Generator Implant Date: 20181205
Lead Channel Impedance Value: 1064 Ohm
Lead Channel Impedance Value: 1064 Ohm
Lead Channel Impedance Value: 1121 Ohm
Lead Channel Impedance Value: 1121 Ohm
Lead Channel Impedance Value: 230.327
Lead Channel Impedance Value: 264.733
Lead Channel Impedance Value: 269.677
Lead Channel Impedance Value: 299.908
Lead Channel Impedance Value: 306.269
Lead Channel Impedance Value: 399 Ohm
Lead Channel Impedance Value: 4047 Ohm
Lead Channel Impedance Value: 418 Ohm
Lead Channel Impedance Value: 456 Ohm
Lead Channel Impedance Value: 513 Ohm
Lead Channel Impedance Value: 722 Ohm
Lead Channel Impedance Value: 760 Ohm
Lead Channel Impedance Value: 817 Ohm
Lead Channel Impedance Value: 836 Ohm
Lead Channel Pacing Threshold Amplitude: 0.375 V
Lead Channel Pacing Threshold Pulse Width: 0.4 ms
Lead Channel Sensing Intrinsic Amplitude: 13 mV
Lead Channel Sensing Intrinsic Amplitude: 13 mV
Lead Channel Setting Pacing Amplitude: 2.25 V
Lead Channel Setting Pacing Amplitude: 2.5 V
Lead Channel Setting Pacing Pulse Width: 0.4 ms
Lead Channel Setting Pacing Pulse Width: 0.6 ms
Lead Channel Setting Sensing Sensitivity: 0.3 mV

## 2022-04-30 NOTE — Patient Instructions (Signed)
Medication Instructions:  Your physician recommends that you continue on your current medications as directed. Please refer to the Current Medication list given to you today.  *If you need a refill on your cardiac medications before your next appointment, please call your pharmacy*   Lab Work: None If you have labs (blood work) drawn today and your tests are completely normal, you will receive your results only by: [place] (if you have MyChart) OR A paper copy in the mail If you have any lab test that is abnormal or we need to change your treatment, we will call you to review the results.  Follow-Up: At [facility], you and your health needs are our priority.  As part of our continuing mission to provide you with exceptional heart care, we have created designated Provider Care Teams.  These Care Teams include your primary Cardiologist (physician) and Advanced Practice Providers (APPs -  Physician Assistants and Nurse Practitioners) who all work together to provide you with the care you need, when you need it.   Your next appointment:   6 month(s)  The format for your next appointment:   In Person  Provider:   Virl Axe, MD

## 2022-05-02 ENCOUNTER — Other Ambulatory Visit: Payer: Self-pay | Admitting: Internal Medicine

## 2022-05-02 DIAGNOSIS — I428 Other cardiomyopathies: Secondary | ICD-10-CM

## 2022-05-02 DIAGNOSIS — I255 Ischemic cardiomyopathy: Secondary | ICD-10-CM

## 2022-05-02 DIAGNOSIS — I4819 Other persistent atrial fibrillation: Secondary | ICD-10-CM

## 2022-05-03 ENCOUNTER — Encounter: Payer: Self-pay | Admitting: Podiatry

## 2022-05-03 DIAGNOSIS — L03115 Cellulitis of right lower limb: Secondary | ICD-10-CM

## 2022-05-07 MED ORDER — CEPHALEXIN 500 MG PO CAPS
500.0000 mg | ORAL_CAPSULE | Freq: Three times a day (TID) | ORAL | 0 refills | Status: DC
Start: 2022-05-07 — End: 2022-09-24

## 2022-05-14 ENCOUNTER — Other Ambulatory Visit: Payer: Self-pay | Admitting: Internal Medicine

## 2022-05-14 ENCOUNTER — Ambulatory Visit (INDEPENDENT_AMBULATORY_CARE_PROVIDER_SITE_OTHER): Payer: Medicare HMO

## 2022-05-14 DIAGNOSIS — I5022 Chronic systolic (congestive) heart failure: Secondary | ICD-10-CM

## 2022-05-14 DIAGNOSIS — L308 Other specified dermatitis: Secondary | ICD-10-CM

## 2022-05-14 DIAGNOSIS — Z95810 Presence of automatic (implantable) cardiac defibrillator: Secondary | ICD-10-CM

## 2022-05-16 NOTE — Progress Notes (Signed)
Remote ICD transmission.   

## 2022-05-17 ENCOUNTER — Ambulatory Visit (INDEPENDENT_AMBULATORY_CARE_PROVIDER_SITE_OTHER): Payer: Medicare HMO | Admitting: Internal Medicine

## 2022-05-17 ENCOUNTER — Encounter: Payer: Self-pay | Admitting: Internal Medicine

## 2022-05-17 VITALS — BP 110/70 | HR 73 | Temp 98.1°F | Ht 69.0 in | Wt 253.0 lb

## 2022-05-17 DIAGNOSIS — F419 Anxiety disorder, unspecified: Secondary | ICD-10-CM | POA: Diagnosis not present

## 2022-05-17 DIAGNOSIS — Z1211 Encounter for screening for malignant neoplasm of colon: Secondary | ICD-10-CM | POA: Diagnosis not present

## 2022-05-17 DIAGNOSIS — E11622 Type 2 diabetes mellitus with other skin ulcer: Secondary | ICD-10-CM

## 2022-05-17 DIAGNOSIS — L97909 Non-pressure chronic ulcer of unspecified part of unspecified lower leg with unspecified severity: Secondary | ICD-10-CM

## 2022-05-17 DIAGNOSIS — F32A Depression, unspecified: Secondary | ICD-10-CM

## 2022-05-17 DIAGNOSIS — E7800 Pure hypercholesterolemia, unspecified: Secondary | ICD-10-CM | POA: Diagnosis not present

## 2022-05-17 DIAGNOSIS — E1165 Type 2 diabetes mellitus with hyperglycemia: Secondary | ICD-10-CM | POA: Diagnosis not present

## 2022-05-17 MED ORDER — DULOXETINE HCL 60 MG PO CPEP: 60.0000 mg | ORAL_CAPSULE | Freq: Every day | ORAL | 3 refills | Status: DC

## 2022-05-17 NOTE — Progress Notes (Signed)
Patient ID: [name], male   DOB: 04/12/1956, 66 y.o.   MRN: [id number]        Chief Complaint: follow up for anxiety depression, dm, hld       HPI:  [name] is a 66 y.o. male here with c/o 1 mo worsening anxiety and depression with several recent increased stressors.  Lower dose effexor just not working well.  Has had mild to mod worsening depressive symptoms, but no suicidal ideation, or panic.  Pt denies chest pain, increased sob or doe, wheezing, orthopnea, PND, increased LE swelling, palpitations, dizziness or syncope.   Pt denies polydipsia, polyuria, or new focal neuro s/s.   Has chronic right medial calf wound with silver sulfa cream, doing well, has f/u with wound clinic next wk.   Wt Readings from Last 3 Encounters:  05/17/22 253 lb (114.8 kg)  04/30/22 254 lb (115.2 kg)  04/24/22 251 lb (113.9 kg)   BP Readings from Last 3 Encounters:  05/17/22 110/70  04/30/22 110/66  04/24/22 116/88         Past Medical History:  Diagnosis Date   AICD (automatic cardioverter/defibrillator) present    Anemia    supposed to be taking Vit B but doesn't   ANXIETY    takes Xanax nightly   Arthritis    Asthma    Albuterol prn and Advair daily;also takes Prednisone daily   Atrial fibrillation ([place]) 09/06/2015   Cardiomyopathy ([place])    a. EF 25% TEE July 2013; b. EF normalized 2015;  c. 03/2015 Echo: EF 40-45%, difrf HK, PASP 38 mmHg, Mild MR, sev LAE/RAE.   CHF (congestive heart failure) (HCC)    Chronic constipation    takes OTC stool softener   COPD (chronic obstructive pulmonary disease) (HCC)    "one dr says COPD; one dr says emphysema" (09/18/2017)   DEPRESSION    takes Zoloft and Doxepin daily   Diverticulitis    DYSKINESIA, ESOPHAGUS    Dysrhythmia    Atrial Fibrilation   Emphysema of lung [facility])    "one dr says COPD; one dr says emphysema" (09/18/2017)   Essential hypertension        FIBROMYALGIA    GERD (gastroesophageal reflux disease)        Glaucoma     HYPERLIPIDEMIA    a. Intolerant to statins.   INSOMNIA    takes Ambien nightly   Myocardial infarction [facility])    a. 2012 Myoview notable for prior infarct;  b. 03/2015 Lexiscan CL: EF 37%, diff HK, small area of inferior infarct from apex to base-->Med Rx.   Myocardial infarction ([place])    O2 dependent    "2.5L q hs & prn" (09/18/2017)   Paroxysmal atrial fibrillation ([place])    a. CHA2DS2VASc = 3--> takes Coumadin;  b. 03/15/2015 Successful TEE/DCCV;  c. 03/2015 recurrent afib, Amio d/c'd in setting of hyperthyroidism.   Peripheral neuropathy    Pneumonia 12/2016   Rash and other nonspecific skin eruption 04/12/2009   no cause found saw dermatologists x 2 and allergist   SLEEP APNEA, OBSTRUCTIVE    a. doesn't use CPAP   Syncope    a. 03/2015 s/p MDT LINQ.   Type II diabetes mellitus ([place])        Past Surgical History:  Procedure Laterality Date   ACNE CYST REMOVAL     2 on back    AV NODE ABLATION N/A 10/25/2017   Procedure: AV NODE ABLATION;  Surgeon: Deboraha Sprang,  MD;  Location: [place] [facility];  Service: Cardiovascular;  Laterality: N/A;   BIV ICD INSERTION CRT-D N/A 09/18/2017   Procedure: BIV ICD INSERTION CRT-D;  Surgeon: Deboraha Sprang, MD;  Location: [place] [facility];  Service: Cardiovascular;  Laterality: N/A;   CARDIAC CATHETERIZATION N/A 03/21/2016   Procedure: Right/Left Heart Cath and Coronary Angiography;  Surgeon: Larey Dresser, MD;  Location: [place] [facility];  Service: Cardiovascular;  Laterality: N/A;   CARDIOVERSION  04/18/2012   Procedure: CARDIOVERSION;  Surgeon: Fay Records, MD;  Location: [place];  Service: Cardiovascular;  Laterality: N/A;   CARDIOVERSION  04/25/2012   Procedure: CARDIOVERSION;  Surgeon: Thayer Headings, MD;  Location: [place];  Service: Cardiovascular;  Laterality: N/A;   CARDIOVERSION  04/25/2012   Procedure: CARDIOVERSION;  Surgeon: Fay Records, MD;  Location: [place];  Service: Cardiovascular;  Laterality: N/A;   CARDIOVERSION   05/09/2012   Procedure: CARDIOVERSION;  Surgeon: Sherren Mocha, MD;  Location: [place];  Service: Cardiovascular;  Laterality: N/A;  changed from [place] to [place] by trish/leone-endo   CARDIOVERSION N/A 03/15/2015   Procedure: CARDIOVERSION;  Surgeon: Thayer Headings, MD;  Location: [place];  Service: Cardiovascular;  Laterality: N/A;   COLONOSCOPY     COLONOSCOPY WITH PROPOFOL N/A 10/21/2014   Procedure: COLONOSCOPY WITH PROPOFOL;  Surgeon: Ladene Artist, MD;  Location: [facility];  Service: Endoscopy;  Laterality: N/A;   EP IMPLANTABLE DEVICE N/A 04/06/2015   Procedure: Loop Recorder Insertion;  Surgeon: Evans Lance, MD;  Location: [place] [facility];  Service: Cardiovascular;  Laterality: N/A;   ESOPHAGOGASTRODUODENOSCOPY     JOINT REPLACEMENT     LOOP RECORDER REMOVAL N/A 09/18/2017   Procedure: LOOP RECORDER REMOVAL;  Surgeon: Deboraha Sprang, MD;  Location: [place] [facility];  Service: Cardiovascular;  Laterality: N/A;   RIGHT/LEFT HEART CATH AND CORONARY ANGIOGRAPHY N/A 01/28/2017   Procedure: Right/Left Heart Cath and Coronary Angiography;  Surgeon: Larey Dresser, MD;  Location: [place] [facility];  Service: Cardiovascular;  Laterality: N/A;   TEE WITHOUT CARDIOVERSION  04/25/2012   Procedure: TRANSESOPHAGEAL ECHOCARDIOGRAM (TEE);  Surgeon: Thayer Headings, MD;  Location: [place];  Service: Cardiovascular;  Laterality: N/A;   TEE WITHOUT CARDIOVERSION N/A 03/15/2015   Procedure: TRANSESOPHAGEAL ECHOCARDIOGRAM (TEE);  Surgeon: Thayer Headings, MD;  Location: [place];  Service: Cardiovascular;  Laterality: N/A;   TONSILLECTOMY AND ADENOIDECTOMY     TOTAL KNEE ARTHROPLASTY Right 06/15/2014   Procedure: TOTAL KNEE ARTHROPLASTY;  Surgeon: Renette Butters, MD;  Location: [place];  Service: Orthopedics;  Laterality: Right;   TOTAL KNEE ARTHROPLASTY Left 10/17/2021   Procedure: Left TOTAL KNEE ARTHROPLASTY;  Surgeon: Mcarthur Rossetti, MD;  Location: [place];  Service:  Orthopedics;  Laterality: Left;    reports that he quit smoking about 14 years ago. His smoking use included cigarettes. He has a 60.00 pack-year smoking history. He has never used smokeless tobacco. He reports that he does not drink alcohol and does not use drugs. family history includes Allergies in his mother; Asthma in his maternal grandmother and mother; COPD in his mother; Colon polyps in his mother; Hypothyroidism in his mother. Allergies  Allergen Reactions   Amiodarone Other (See Comments)    hyperthyroidism   Statins Other (See Comments)    myalgia   Tape Other (See Comments)    Skin Tears Use Paper Tape Only   Current Outpatient Medications on File Prior to Visit  Medication Sig Dispense Refill   acetaminophen (TYLENOL) 500 MG tablet Take 500 mg by mouth every 6 (six) hours as needed for moderate pain.     albuterol (VENTOLIN HFA) 108 (90 Base) MCG/ACT inhaler INHALE 2 PUFFS BY MOUTH 4 TIMES A DAY AS NEEDED 54 each 3   ALPRAZolam (XANAX) 0.5 MG tablet TAKE 2 TABLETS BY MOUTH AT BEDTIME 60 tablet 5   apixaban (ELIQUIS) 5 MG TABS tablet Take 1 tablet (5 mg total) by mouth 2 (two) times daily. 60 tablet 11   augmented betamethasone dipropionate (DIPROLENE-AF) 0.05 % cream Apply 1 application topically daily as needed (itching).     carisoprodol (SOMA) 350 MG tablet TAKE 1 TABLET BY MOUTH THREE TIMES A DAY AS NEEDED FOR MUSCLE SPASMS 90 tablet 2   carvedilol (COREG) 6.25 MG tablet Take 1 tablet (6.25 mg total) by mouth 2 (two) times daily. 60 tablet 11   cephALEXin (KEFLEX) 500 MG capsule Take 1 capsule (500 mg total) by mouth 3 (three) times daily. 30 capsule 0   Cholecalciferol 100 MCG (4000 UT) CAPS 1 tab by mouth once daily 30 capsule 99   dapagliflozin propanediol (FARXIGA) 10 MG TABS tablet Take 1 tablet (10 mg total) by mouth daily before breakfast. 90 tablet 3   doxepin (SINEQUAN) 10 MG capsule TAKE 2 CAPSULES (20 MG TOTAL) BY MOUTH AT BEDTIME AS NEEDED. 180 capsule 1    Dupilumab (DUPIXENT) 300 MG/2ML SOPN Inject 300 mg into the skin every 14 (fourteen) days.     eplerenone (INSPRA) 50 MG tablet Take 1 tablet (50 mg total) by mouth daily. 90 tablet 3   Evolocumab (REPATHA SURECLICK) 409 MG/ML SOAJ INJECT 1 PEN INTO THE SKIN EVERY 14 (FOURTEEN) DAYS. 6 mL 3   fluticasone (CUTIVATE) 0.05 % cream SMARTSIG:Topical 1-2 Times Daily PRN     fluticasone (FLONASE) 50 MCG/ACT nasal spray PLACE 2 SPRAYS INTO BOTH NOSTRILS DAILY AS NEEDED FOR ALLERGIES OR RHINITIS. 48 mL 1   hydrOXYzine (ATARAX/VISTARIL) 10 MG tablet TAKE 1 TABLET BY MOUTH 3 TIMES DAILY AS NEEDED FOR ITCHING. 60 tablet 2   ketoconazole (NIZORAL) 2 % cream APPLY 1 APPLICATION TOPICALLY DAILY 30 g 1   lidocaine (LIDODERM) 5 % APPLY 1 PATCH TO AFFECTED AREA FOR UP TO 12 HOURS. DO NOT USE MORE THAN ONE PATCH IN 24 HOURS. 90 patch 2   losartan (COZAAR) 25 MG tablet Take 1 tablet (25 mg total) by mouth daily. 30 tablet 11   metolazone (ZAROXOLYN) 2.5 MG tablet Take 1 tablet (2.5 mg total) by mouth as directed. With additional 40 meq of potassium 5 tablet 0   omeprazole (PRILOSEC) 20 MG capsule TAKE 1 CAPSULE BY MOUTH TWICE A DAY 180 capsule 1   Potassium Chloride (KLOR-CON PO) Take 20 mcg by mouth daily.     silver sulfADIAZINE (SILVADENE) 1 % cream APPLY TOPICALLY TO AFFECTED AREA EVERY DAY 50 g 3   tamsulosin (FLOMAX) 0.4 MG CAPS capsule TAKE 1 CAPSULE BY MOUTH EVERY DAY 90 capsule 3   torsemide (DEMADEX) 20 MG tablet Take 2 tablets by mouth 2 times daily. 60 tablet 3   traMADol (ULTRAM) 50 MG tablet TAKE 1 TABLET BY MOUTH EVERY DAY AS NEEDED 30 tablet 2   traZODone (DESYREL) 100 MG tablet TAKE 2 TABLETS BY MOUTH AT BEDTIME AS NEEDED 180 tablet 1   TUMS 500 MG chewable tablet Chew 500-2,000 mg by mouth every 4 (four) hours as needed for indigestion or heartburn.  zolpidem (AMBIEN) 10 MG tablet TAKE 1 TABLET BY MOUTH EVERY DAY AT BEDTIME AS NEEDED 90 tablet 1   No current facility-administered medications on  file prior to visit.        ROS:  All others reviewed and negative.  Objective        PE:  BP 110/70 (BP Location: Right Arm, Patient Position: Sitting, Cuff Size: Large)   Pulse 73   Temp 98.1 F (36.7 C) (Oral)   Ht '5\' 9"'$  (1.753 m)   Wt 253 lb (114.8 kg)   SpO2 97%   BMI 37.36 kg/m                 Constitutional: Pt appears in NAD               HENT: Head: NCAT.                Right Ear: External ear normal.                 Left Ear: External ear normal.                Eyes: . Pupils are equal, round, and reactive to light. Conjunctivae and EOM are normal               Nose: without d/c or deformity               Neck: Neck supple. Gross normal ROM               Cardiovascular: Normal rate and regular rhythm.                 Pulmonary/Chest: [place] normal and breath sounds without rales or wheezing.                Abd:  Soft, NT, ND, + BS, no organomegaly               Neurological: Pt is alert. At baseline orientation, motor grossly intact               Skin: Skin is warm. No rashes, no other new lesions, LE edema - chronic trace bilateral;  has mid leg medial calf 1/2 cm oval ulcer without red swelling tender or red streaks               Psychiatric: Pt behavior is normal without agitation   Micro: none  Cardiac tracings I have personally interpreted today:  none  Pertinent Radiological findings (summarize): none   Lab Results  Component Value Date   WBC 7.1 03/20/2022   HGB 13.4 03/20/2022   HCT 40.4 03/20/2022   PLT 188 03/20/2022   GLUCOSE 89 03/30/2022   CHOL 99 12/20/2021   TRIG 132.0 12/20/2021   HDL 45.20 12/20/2021   LDLDIRECT 72.0 02/28/2021   LDLCALC 28 12/20/2021   ALT 15 12/20/2021   AST 17 12/20/2021   NA 140 03/30/2022   K 4.4 03/30/2022   CL 107 03/30/2022   CREATININE 1.37 (H) 03/30/2022   BUN 29 (H) 03/30/2022   CO2 25 03/30/2022   TSH 1.09 12/20/2021   PSA 2.62 12/20/2021   INR 1.1 05/08/2021   HGBA1C 5.4 12/20/2021   MICROALBUR <0.7  12/20/2021   Assessment/Plan:  [name] is a 66 y.o. White or Caucasian [1] male with  has a past medical history of AICD (automatic cardioverter/defibrillator) present, Anemia, ANXIETY, Arthritis, Asthma, Atrial fibrillation ([place]) (09/06/2015), Cardiomyopathy ([place]), CHF (congestive heart  failure) ([place]), Chronic constipation, COPD (chronic obstructive pulmonary disease) ([place]), DEPRESSION, Diverticulitis, DYSKINESIA, ESOPHAGUS, Dysrhythmia, Emphysema of lung ([place]), Essential hypertension, FIBROMYALGIA, GERD (gastroesophageal reflux disease), Glaucoma, HYPERLIPIDEMIA, INSOMNIA, Myocardial infarction [facility]), Myocardial infarction ([place]), O2 dependent, Paroxysmal atrial fibrillation ([place]), Peripheral neuropathy, Pneumonia (12/2016), Rash and other nonspecific skin eruption (04/12/2009), SLEEP APNEA, OBSTRUCTIVE, Syncope, and Type II diabetes mellitus ([place]).  Anxiety and depression With recent increase related to worsening health and personal stressors; declines need to referral for counseling or psychiatry, denies SI or HI; now for change effexor to cymbalta 60 mg qd,  to f/u any worsening symptoms or concerns  Type 2 diabetes mellitus ([place]) Lab Results  Component Value Date   HGBA1C 5.4 12/20/2021   Stable, pt to continue current medical treatment farxiga 10 mg qd   Hyperlipidemia . Lab Results  Component Value Date   LDLCALC 28 12/20/2021   Stable, has been statin intolerant,  pt to continue current repatha asd   Diabetic leg ulcer ([place]) overal improved to stable, to f/u wound clinic soon  Followup: Return in about 6 weeks (around 06/27/2022).  [name], MD 05/20/2022 9:40 AM [facility]

## 2022-05-17 NOTE — Progress Notes (Signed)
EPIC Encounter for ICM Monitoring  Patient Name: [name] is a 66 y.o. male Date: 05/17/2022 Primary Care Physican: [name], MD Primary Cardiologist: [name] Electrophysiologist: Vergie Living Pacing: 99.7%    11/22/2021 Weight: 244.5 lbs 04/10/2022 Weight: 245.8 lbs     Spoke with patient and heart failure questions reviewed.  Pt asymptomatic for fluid accumulation.  Reports feeling well at this time and voices no complaints.    Optivol thoracic impedance normal but was suggesting intermittent days with possible fluid accumulation since 6/26.   Prescribed:  Torsemide 20 mg to 2 tablets (40 mg total) by mouth twice a day.   Potassium 20 mEq take 1 tablets (20 mEq total) daily   Labs: 03/30/2022 Creatinine 1.37, BUN 29, Potassium 44, Sodium 140 03/20/2022 Creatinine 1.51, BUN 36, Potassium 4.5, Sodium 135  12/20/2021 Creatinine 1.00, BUN 27, Potassium 4.3, Sodium 140, GFR 78.15 12/15/2021 Creatinine 1.05, BUN 23, Potassium 4.5, Sodium 137, GFR >60 A complete set of results can be found in Results Review.   Recommendations:  No changes and encouraged to call if experiencing any fluid symptoms.   Follow-up plan: ICM clinic phone appointment on 06/19/2022.   91 day device clinic remote transmission 07/25/2022.   EP/Cardiology Office Visits:   Recall 07/20/2022 with Dr [name].  Recall 10/28/2022 with Dr [name].      Copy of ICM check sent to Dr. [name].     3 month ICM trend: 05/14/2022.    12-14 Month ICM trend:     [name], RN 05/17/2022 1:46 PM

## 2022-05-17 NOTE — Patient Instructions (Signed)
Ok to stop the venlefaxine  Please take all new medication as prescribed - the Cymbalta 60 mg per day  Please continue all other medications as before, and refills have been done if requested.  Please have the pharmacy call with any other refills you may need.  Please continue your efforts at being more active, low cholesterol diet, and weight control.  Please keep your appointments with your specialists as you may have planned

## 2022-05-20 ENCOUNTER — Encounter: Payer: Self-pay | Admitting: Internal Medicine

## 2022-05-20 DIAGNOSIS — E11622 Type 2 diabetes mellitus with other skin ulcer: Secondary | ICD-10-CM | POA: Insufficient documentation

## 2022-05-20 DIAGNOSIS — F32A Depression, unspecified: Secondary | ICD-10-CM | POA: Insufficient documentation

## 2022-05-20 NOTE — Assessment & Plan Note (Signed)
With recent increase related to worsening health and personal stressors; declines need to referral for counseling or psychiatry, denies SI or HI; now for change effexor to cymbalta 60 mg qd,  to f/u any worsening symptoms or concerns

## 2022-05-20 NOTE — Assessment & Plan Note (Signed)
Lab Results  Component Value Date   HGBA1C 5.4 12/20/2021   Stable, pt to continue current medical treatment farxiga 10 mg qd

## 2022-05-20 NOTE — Assessment & Plan Note (Signed)
.   Lab Results  Component Value Date   LDLCALC 28 12/20/2021   Stable, has been statin intolerant,  pt to continue current repatha asd

## 2022-05-20 NOTE — Assessment & Plan Note (Signed)
overal improved to stable, to f/u wound clinic soon

## 2022-05-22 ENCOUNTER — Other Ambulatory Visit (HOSPITAL_COMMUNITY): Payer: Self-pay

## 2022-05-23 ENCOUNTER — Other Ambulatory Visit: Payer: Self-pay | Admitting: Internal Medicine

## 2022-05-23 NOTE — Progress Notes (Addendum)
[name], Zachary ([id number]) Visit Report for 05/24/2022 Allergy List Details Patient Name: Date of Service: [name] RD L. 05/24/2022 1:15 PM Medical Record Number: [id number] Patient Account Number: [id number] Date of Birth/Sex: Treating RN: [birth year] (66 y.o. [name] Primary Care Sheryn Aldaz: [name] Other Clinician: Referring Mayola Mcbain: Treating Demetries Coia/Extender: Larwance Sachs in Treatment: 0 Allergies Active Allergies No Known Allergies Allergy Notes Electronic Signature(s) Signed: 05/24/2022 5:18:00 PM By: [name] Previous Signature: 05/23/2022 8:38:29 AM Version By: [name] Entered By: [name] on 05/24/2022 13:51:43 -------------------------------------------------------------------------------- Arrival Information Details Patient Name: Date of Service: Clemetine [name], Zachary Fenton RD L. 05/24/2022 1:15 PM Medical Record Number: [id number] Patient Account Number: [id number] Date of Birth/Sex: Treating RN: 09-09-1956 (66 y.o. [name] Primary Care Merriam Brandner: [name] Other Clinician: Referring Christean Silvestri: Treating Axle Parfait/Extender: Larwance Sachs in Treatment: 0 Visit Information Patient Arrived: Lyndel Pleasure Time: 13:48 Transfer Assistance: None Patient Identification Verified: Yes Secondary Verification Process Completed: Yes Patient Requires Transmission-Based Precautions: No Patient Has Alerts: Yes Patient Alerts: Patient on Blood Thinner Pacemaker/Defibrillator History Since Last Visit Added or deleted any medications: No Any new allergies or adverse reactions: No Had a fall or experienced change in activities of daily living that may affect risk of falls: No Signs or symptoms of abuse/neglect since last visito No Hospitalized since last visit: No Implantable device outside of the clinic excluding cellular tissue based products placed in the center since last visit: No Has Dressing in Place  as Prescribed: Yes Electronic Signature(s) Signed: 05/24/2022 5:18:00 PM By: [name] Entered By: [name] on 05/24/2022 13:49:31 -------------------------------------------------------------------------------- Clinic Level of Care Assessment Details Patient Name: Date of ServiceLissa [name] RD L. 05/24/2022 1:15 PM Medical Record Number: [id number] Patient Account Number: [id number] Date of Birth/Sex: Treating RN: 03-24-1956 (66 y.o. [name] Primary Care Krisi Azua: [name] Other Clinician: Referring Lidya Mccalister: Treating Samiah Ricklefs/Extender: Larwance Sachs in Treatment: 0 Clinic Level of Care Assessment Items TOOL 1 Quantity Score X- 1 0 Use when EandM and Procedure is performed on INITIAL visit ASSESSMENTS - Nursing Assessment / Reassessment X- 1 20 General Physical Exam (combine w/ comprehensive assessment (listed just below) when performed on new pt. evals) X- 1 25 Comprehensive Assessment (HX, ROS, Risk Assessments, Wounds Hx, etc.) ASSESSMENTS - Wound and Skin Assessment / Reassessment '[]'$  - 0 Dermatologic / Skin Assessment (not related to wound area) ASSESSMENTS - Ostomy and/or Continence Assessment and Care '[]'$  - 0 Incontinence Assessment and Management '[]'$  - 0 Ostomy Care Assessment and Management (repouching, etc.) PROCESS - Coordination of Care '[]'$  - 0 Simple Patient / Family Education for ongoing care X- 1 20 Complex (extensive) Patient / Family Education for ongoing care X- 1 10 Staff obtains Programmer, systems, Records, T Results / Process Orders est X- 1 10 Staff telephones HHA, Nursing Homes / Clarify orders / etc '[]'$  - 0 Routine Transfer to another Facility (non-emergent condition) '[]'$  - 0 Routine Hospital Admission (non-emergent condition) '[]'$  - 0 New Admissions / Biomedical engineer / Ordering NPWT Apligraf, etc. , '[]'$  - 0 Emergency Hospital Admission (emergent condition) PROCESS - Special Needs '[]'$  - 0 Pediatric /  Minor Patient Management '[]'$  - 0 Isolation Patient Management '[]'$  - 0 Hearing / Language / Visual special needs '[]'$  - 0 Assessment of Community assistance (transportation, D/C planning, etc.) '[]'$  - 0 Additional assistance / Altered mentation '[]'$  - 0 Support Surface(s) Assessment (bed, cushion, seat, etc.) INTERVENTIONS - Miscellaneous '[]'$  - 0 External  ear exam '[]'$  - 0 Patient Transfer (multiple staff / Civil Service fast streamer / Similar devices) '[]'$  - 0 Simple Staple / Suture removal (25 or less) '[]'$  - 0 Complex Staple / Suture removal (26 or more) '[]'$  - 0 Hypo/Hyperglycemic Management (do not check if billed separately) X- 1 15 Ankle / Brachial Index (ABI) - do not check if billed separately Has the patient been seen at the hospital within the last three years: Yes Total Score: 100 Level Of Care: New/Established - Level 3 Electronic Signature(s) Signed: 05/24/2022 5:18:00 PM By: [name] Signed: 05/24/2022 5:18:00 PM By: [name] Entered By: [name] on 05/24/2022 14:43:44 -------------------------------------------------------------------------------- Compression Therapy Details Patient Name: Date of Service: [name] RD L. 05/24/2022 1:15 PM Medical Record Number: [id number] Patient Account Number: [id number] Date of Birth/Sex: Treating RN: 1956/02/27 (66 y.o. [name] Primary Care Jatziri Goffredo: [name] Other Clinician: Referring Taleisha Kaczynski: Treating Azaria Bartell/Extender: Larwance Sachs in Treatment: 0 Compression Therapy Performed for Wound Assessment: Wound #3 Right,Medial Lower Leg Performed By: Clinician [name], RN Compression Type: Three Layer Post Procedure Diagnosis Same as Pre-procedure Electronic Signature(s) Signed: 05/24/2022 5:18:00 PM By: [name] Entered By: [name] on 05/24/2022 14:39:40 -------------------------------------------------------------------------------- Encounter Discharge Information  Details Patient Name: Date of Service: Clemetine [name], Zachary Fenton RD L. 05/24/2022 1:15 PM Medical Record Number: [id number] Patient Account Number: [id number] Date of Birth/Sex: Treating RN: Apr 04, 1956 (66 y.o. [name] Primary Care Charlett Merkle: [name] Other Clinician: Referring Dorion Petillo: Treating Cleophas Yoak/Extender: Larwance Sachs in Treatment: 0 Encounter Discharge Information Items Post Procedure Vitals Discharge Condition: Stable Temperature (F): 97.8 Ambulatory Status: Cane Pulse (bpm): 76 Discharge Destination: Home Respiratory Rate (breaths/min): 18 Transportation: Private Auto Blood Pressure (mmHg): 94/64 Schedule Follow-up Appointment: Yes Clinical Summary of Care: Provided on 05/24/2022 Form Type Recipient Paper Patient Patient Electronic Signature(s) Signed: 05/24/2022 5:18:00 PM By: [name] Entered By: [name] on 05/24/2022 14:58:03 -------------------------------------------------------------------------------- Lower Extremity Assessment Details Patient Name: Date of Service: [name] RD L. 05/24/2022 1:15 PM Medical Record Number: [id number] Patient Account Number: [id number] Date of Birth/Sex: Treating RN: Sep 02, 1956 (66 y.o. [name] Primary Care Pattiann Solanki: [name] Other Clinician: Referring Khizar Fiorella: Treating Orville Widmann/Extender: Larwance Sachs in Treatment: 0 Edema Assessment Assessed: [name]: No] [name]: Yes] Edema: [Left: Ye] [Right: s] Calf Left: Right: Point of Measurement: 36 cm From Medial Instep 36.8 cm Ankle Left: Right: Point of Measurement: 12 cm From Medial Instep 22.4 cm Knee To Floor Left: Right: From Medial Instep 43 cm Vascular Assessment Pulses: Dorsalis Pedis Palpable: [Right:Yes] Blood Pressure: Brachial: [Right:94] Ankle: [Right:Dorsalis Pedis: 120 1.28] Electronic Signature(s) Signed: 05/24/2022 5:18:00 PM By: [name] Entered By: [name] on  05/24/2022 14:06:45 -------------------------------------------------------------------------------- Multi Wound Chart Details Patient Name: Date of Service: [name] RD L. 05/24/2022 1:15 PM Medical Record Number: [id number] Patient Account Number: [id number] Date of Birth/Sex: Treating RN: September 10, 1956 (66 y.o. [name] Primary Care Leeum Sankey: [name] Other Clinician: Referring Lashun Ramseyer: Treating Dorrie Cocuzza/Extender: Larwance Sachs in Treatment: 0 Vital Signs Height(in): 70 Pulse(bpm): 31 Weight(lbs): 253 Blood Pressure(mmHg): 94/64 Body Mass Index(BMI): 36.3 Temperature(F): 97.8 Respiratory Rate(breaths/min): 18 Photos: [N/A:N/A] Right, Anterior Lower Leg N/A N/A Wound Location: Trauma N/A N/A Wounding Event: Venous Leg Ulcer N/A N/A Primary Etiology: Cataracts, Glaucoma, Anemia, N/A N/A Comorbid History: Asthma, Chronic Obstructive Pulmonary Disease (COPD), Sleep Apnea, Arrhythmia, Congestive Heart Failure, Deep Vein Thrombosis, Myocardial Infarction, Peripheral Venous Disease, Type II Diabetes, Osteoarthritis 04/15/2022 N/A N/A  Date Acquired: 0 N/A N/A Weeks of Treatment: Open N/A N/A Wound Status: No N/A N/A Wound Recurrence: 2x1.9x0.2 N/A N/A Measurements L x W x D (cm) 2.985 N/A N/A A (cm) : rea 0.597 N/A N/A Volume (cm) : 0.00% N/A N/A % Reduction in A rea: 0.00% N/A N/A % Reduction in Volume: Full Thickness Without Exposed N/A N/A Classification: Support Structures Medium N/A N/A Exudate A mount: Serosanguineous N/A N/A Exudate Type: red, brown N/A N/A Exudate Color: Distinct, outline attached N/A N/A Wound Margin: Medium (34-66%) N/A N/A Granulation A mount: Red N/A N/A Granulation Quality: Medium (34-66%) N/A N/A Necrotic A mount: Fat Layer (Subcutaneous Tissue): Yes N/A N/A Exposed Structures: Fascia: No Tendon: No Muscle: No Joint: No Bone: No None N/A N/A Epithelialization: Debridement  - Excisional N/A N/A Debridement: Pre-procedure Verification/Time Out 14:34 N/A N/A Taken: Lidocaine 4% Topical Solution N/A N/A Pain Control: Subcutaneous, Slough N/A N/A Tissue Debrided: Skin/Subcutaneous Tissue N/A N/A Level: 3.8 N/A N/A Debridement A (sq cm): rea Curette N/A N/A Instrument: Minimum N/A N/A Bleeding: Pressure N/A N/A Hemostasis A chieved: Procedure was tolerated well N/A N/A Debridement Treatment Response: 2x1.9x0.2 N/A N/A Post Debridement Measurements L x W x D (cm) 0.597 N/A N/A Post Debridement Volume: (cm) Periwound erythema N/A N/A Assessment Notes: Compression Therapy N/A N/A Procedures Performed: Debridement Treatment Notes Electronic Signature(s) Signed: 05/24/2022 5:18:00 PM By: [name] Signed: 05/25/2022 1:41:40 PM By: [name] Entered By: Kalman Shan on 05/24/2022 14:55:34 -------------------------------------------------------------------------------- Multi-Disciplinary Care Plan Details Patient Name: Date of Service: [name] RD L. 05/24/2022 1:15 PM Medical Record Number: [id number] Patient Account Number: [id number] Date of Birth/Sex: Treating RN: [birth year] (66 y.o. [name] Primary Care Kamarrion Stfort: [name] Other Clinician: Referring Farris Geiman: Treating Lesleigh Hughson/Extender: Larwance Sachs in Treatment: 0 Active Inactive Wound/Skin Impairment Nursing Diagnoses: Impaired tissue integrity Goals: Patient/caregiver will verbalize understanding of skin care regimen Date Initiated: 05/24/2022 Target Resolution Date: 06/21/2022 Goal Status: Active Ulcer/skin breakdown will have a volume reduction of 30% by week 4 Date Initiated: 05/24/2022 Target Resolution Date: 06/21/2022 Goal Status: Active Interventions: Assess patient/caregiver ability to obtain necessary supplies Assess patient/caregiver ability to perform ulcer/skin care regimen upon admission and as needed Provide  education on ulcer and skin care Treatment Activities: Topical wound management initiated : 05/24/2022 Notes: Electronic Signature(s) Signed: 05/24/2022 5:18:00 PM By: [name] Entered By: [name] on 05/24/2022 14:20:03 -------------------------------------------------------------------------------- Pain Assessment Details Patient Name: Date of Service: [name] RD L. 05/24/2022 1:15 PM Medical Record Number: [id number] Patient Account Number: [id number] Date of Birth/Sex: Treating RN: 1956-09-06 (66 y.o. [name] Primary Care Matt Delpizzo: [name] Other Clinician: Referring Kenyetta Wimbish: Treating Tiahna Cure/Extender: Larwance Sachs in Treatment: 0 Active Problems Location of Pain Severity and Description of Pain Patient Has Paino Yes Site Locations Pain Location: Pain in Ulcers With Dressing Change: Yes Duration of the Pain. Constant / Intermittento Intermittent Rate the pain. Current Pain Level: 3 Character of Pain Describe the Pain: Dull, Tender Pain Management and Medication Current Pain Management: Medication: Yes Cold Application: No Rest: Yes Massage: No Activity: No T.E.N.S.: No Heat Application: No Leg drop or elevation: No Is the Current Pain Management Adequate: Adequate How does your wound impact your activities of daily livingo Sleep: No Bathing: No Appetite: No Relationship With Others: No Bladder Continence: No Emotions: No Bowel Continence: No Work: No Toileting: No Drive: No Dressing: No Hobbies: No Electronic Signature(s) Signed: 05/24/2022 5:18:00 PM By: [name] Entered By:  [name] on 05/24/2022 13:57:35 -------------------------------------------------------------------------------- Patient/Caregiver Education Details Patient Name: Date of Service: [name] RD Carlean Jews 8/10/2023andnbsp1:15 PM Medical Record Number: [id number] Patient Account Number: [id number] Date of  Birth/Gender: Treating RN: 10-06-1956 (66 y.o. [name] Primary Care Physician: [name] Other Clinician: Referring Physician: Treating Physician/Extender: Larwance Sachs in Treatment: 0 Education Assessment Education Provided To: Patient Education Topics Provided Venous: Methods: Explain/Verbal, Printed Responses: State content correctly Wound/Skin Impairment: Methods: Demonstration, Explain/Verbal, Printed Responses: State content correctly Electronic Signature(s) Signed: 05/24/2022 5:18:00 PM By: [name] Entered By: [name] on 05/24/2022 14:20:21 -------------------------------------------------------------------------------- Wound Assessment Details Patient Name: Date of Service: [name] RD L. 05/24/2022 1:15 PM Medical Record Number: [id number] Patient Account Number: [id number] Date of Birth/Sex: Treating RN: [birth year] (66 y.o. [name] Primary Care Kaiden Pech: [name] Other Clinician: Referring Jalaiya Oyster: Treating Abhijay Morriss/Extender: Larwance Sachs in Treatment: 0 Wound Status Wound Number: 3 Primary Venous Leg Ulcer Etiology: Wound Location: Right, Anterior Lower Leg Wound Open Wounding Event: Trauma Status: Date Acquired: 04/15/2022 Date Acquired: 04/15/2022 Comorbid Cataracts, Glaucoma, Anemia, Asthma, Chronic Obstructive Weeks Of Treatment: 0 History: Pulmonary Disease (COPD), Sleep Apnea, Arrhythmia, Congestive Clustered Wound: No Heart Failure, Deep Vein Thrombosis, Myocardial Infarction, Peripheral Venous Disease, Type II Diabetes, Osteoarthritis Photos Wound Measurements Length: (cm) 2 Width: (cm) 1.9 Depth: (cm) 0.2 Area: (cm) 2.985 Volume: (cm) 0.597 % Reduction in Area: 0% % Reduction in Volume: 0% Epithelialization: None Tunneling: No Undermining: No Wound Description Classification: Full Thickness Without Exposed Support Structures Wound Margin: Distinct,  outline attached Exudate Amount: Medium Exudate Type: Serosanguineous Exudate Color: red, brown Foul Odor After Cleansing: No Slough/Fibrino Yes Wound Bed Granulation Amount: Medium (34-66%) Exposed Structure Granulation Quality: Red Fascia Exposed: No Necrotic Amount: Medium (34-66%) Fat Layer (Subcutaneous Tissue) Exposed: Yes Necrotic Quality: Adherent Slough Tendon Exposed: No Muscle Exposed: No Joint Exposed: No Bone Exposed: No Assessment Notes Periwound erythema Electronic Signature(s) Signed: 05/24/2022 5:18:00 PM By: [name] Entered By: [name] on 05/24/2022 14:13:44 -------------------------------------------------------------------------------- Vitals Details Patient Name: Date of Service: [name] RD L. 05/24/2022 1:15 PM Medical Record Number: [id number] Patient Account Number: [id number] Date of Birth/Sex: Treating RN: 07-06-1956 (66 y.o. [name] Primary Care Kypton Eltringham: [name] Other Clinician: Referring Abbeygail Igoe: Treating Allena Pietila/Extender: Larwance Sachs in Treatment: 0 Vital Signs Time Taken: 13:49 Temperature (F): 97.8 Height (in): 70 Pulse (bpm): 76 Source: Stated Respiratory Rate (breaths/min): 18 Weight (lbs): 253 Blood Pressure (mmHg): 94/64 Source: Stated Reference Range: 80 - 120 mg / dl Body Mass Index (BMI): 36.3 Electronic Signature(s) Signed: 05/24/2022 5:18:00 PM By: [name] Entered By: [name] on 05/24/2022 13:51:35

## 2022-05-23 NOTE — Telephone Encounter (Signed)
Please refill as per office routine med refill policy (all routine meds to be refilled for 3 mo or monthly (per pt preference) up to one year from last visit, then month to month grace period for 3 mo, then further med refills will have to be denied) ? ?

## 2022-05-24 ENCOUNTER — Encounter (HOSPITAL_BASED_OUTPATIENT_CLINIC_OR_DEPARTMENT_OTHER): Payer: Medicare HMO | Attending: Internal Medicine | Admitting: Internal Medicine

## 2022-05-24 DIAGNOSIS — T798XXA Other early complications of trauma, initial encounter: Secondary | ICD-10-CM | POA: Diagnosis not present

## 2022-05-24 DIAGNOSIS — I5022 Chronic systolic (congestive) heart failure: Secondary | ICD-10-CM | POA: Insufficient documentation

## 2022-05-24 DIAGNOSIS — I87311 Chronic venous hypertension (idiopathic) with ulcer of right lower extremity: Secondary | ICD-10-CM | POA: Diagnosis not present

## 2022-05-24 DIAGNOSIS — I428 Other cardiomyopathies: Secondary | ICD-10-CM | POA: Diagnosis not present

## 2022-05-24 DIAGNOSIS — L97812 Non-pressure chronic ulcer of other part of right lower leg with fat layer exposed: Secondary | ICD-10-CM | POA: Diagnosis not present

## 2022-05-24 DIAGNOSIS — J449 Chronic obstructive pulmonary disease, unspecified: Secondary | ICD-10-CM | POA: Insufficient documentation

## 2022-05-24 DIAGNOSIS — I110 Hypertensive heart disease with heart failure: Secondary | ICD-10-CM | POA: Diagnosis not present

## 2022-05-24 DIAGNOSIS — E11622 Type 2 diabetes mellitus with other skin ulcer: Secondary | ICD-10-CM | POA: Diagnosis not present

## 2022-05-24 DIAGNOSIS — I4819 Other persistent atrial fibrillation: Secondary | ICD-10-CM | POA: Diagnosis not present

## 2022-05-24 DIAGNOSIS — I872 Venous insufficiency (chronic) (peripheral): Secondary | ICD-10-CM | POA: Diagnosis not present

## 2022-05-24 NOTE — Progress Notes (Signed)
[name], [name] ([id number]) Visit Report for 05/24/2022 Abuse Risk Screen Details Patient Name: Date of Service: [name] RD L. 05/24/2022 1:15 PM Medical Record Number: [id number] Patient Account Number: [id number] Date of Birth/Sex: Treating RN: [birth year] (66 y.o. [name] Primary Care Janesa Dockery: [name] Other Clinician: Referring Aikam Hellickson: Treating Keiston Manley/Extender: Larwance Sachs in Treatment: 0 Abuse Risk Screen Items Answer ABUSE RISK SCREEN: Has anyone close to you tried to hurt or harm you recentlyo No Do you feel uncomfortable with anyone in your familyo No Has anyone forced you do things that you didnt want to doo No Electronic Signature(s) Signed: 05/24/2022 5:18:00 PM By: [name] Entered By: [name] on 05/24/2022 13:53:13 -------------------------------------------------------------------------------- Activities of Daily Living Details Patient Name: Date of ServiceLissa [name] RD L. 05/24/2022 1:15 PM Medical Record Number: [id number] Patient Account Number: [id number] Date of Birth/Sex: Treating RN: 1955-11-01 (66 y.o. [name] Primary Care Adian Jablonowski: [name] Other Clinician: Referring Coltrane Tugwell: Treating Fabrizio Filip/Extender: Larwance Sachs in Treatment: 0 Activities of Daily Living Items Answer Activities of Daily Living (Please select one for each item) Drive Automobile Completely Able T Medications ake Completely Able Use T elephone Completely Able Care for Appearance Completely Able Use T oilet Completely Able Bath / Shower Completely Able Dress Self Completely Able Feed Self Completely Able Walk Need Assistance Get In / Out Bed Completely Able Housework Completely Able Prepare Meals Completely Ismay for Self Completely Able Electronic Signature(s) Signed: 05/24/2022 5:18:00 PM By: [name] Entered By: [name] on 05/24/2022  13:53:42 -------------------------------------------------------------------------------- Education Screening Details Patient Name: Date of Service: [name] RD L. 05/24/2022 1:15 PM Medical Record Number: [id number] Patient Account Number: [id number] Date of Birth/Sex: Treating RN: 1956/06/24 (66 y.o. [name] Primary Care Sie Formisano: [name] Other Clinician: Referring Liyat Faulkenberry: Treating Faheem Ziemann/Extender: Larwance Sachs in Treatment: 0 Primary Learner Assessed: Patient Learning Preferences/Education Level/Primary Language Learning Preference: Explanation, Demonstration, Printed Material Preferred Language: English Cognitive Barrier Language Barrier: No Translator Needed: No Memory Deficit: No Emotional Barrier: No Cultural/Religious Beliefs Affecting Medical Care: No Physical Barrier Impaired Vision: Yes Glasses Impaired Hearing: No Decreased Hand dexterity: No Knowledge/Comprehension Knowledge Level: High Comprehension Level: High Ability to understand written instructions: High Ability to understand verbal instructions: High Motivation Anxiety Level: Calm Cooperation: Cooperative Education Importance: Acknowledges Need Interest in Health Problems: Asks Questions Perception: Coherent Willingness to Engage in Self-Management High Activities: Readiness to Engage in Self-Management High Activities: Electronic Signature(s) Signed: 05/24/2022 5:18:00 PM By: [name] Entered By: [name] on 05/24/2022 13:54:08 -------------------------------------------------------------------------------- Fall Risk Assessment Details Patient Name: Date of Service: [name] RD L. 05/24/2022 1:15 PM Medical Record Number: [id number] Patient Account Number: [id number] Date of Birth/Sex: Treating RN: [birth year] (66 y.o. [name] Primary Care Lakyn Mantione: [name] Other Clinician: Referring Maizy Davanzo: Treating Migdalia Olejniczak/Extender:  Larwance Sachs in Treatment: 0 Fall Risk Assessment Items Have you had 2 or more falls in the last 12 monthso 0 No Have you had any fall that resulted in injury in the last 12 monthso 0 No FALLS RISK SCREEN History of falling - immediate or within 3 months 0 No Secondary diagnosis (Do you have 2 or more medical diagnoseso) 15 Yes Ambulatory aid None/bed rest/wheelchair/nurse 0 No Crutches/cane/walker 15 Yes Furniture 0 No Intravenous therapy Access/Saline/Heparin Lock 0 No Gait/Transferring Normal/ bed rest/ wheelchair 0 Yes Weak (short steps with or without shuffle, stooped but able  to lift head while walking, may seek 0 No support from furniture) Impaired (short steps with shuffle, may have difficulty arising from chair, head down, impaired 0 No balance) Mental Status Oriented to own ability 0 Yes Electronic Signature(s) Signed: 05/24/2022 5:18:00 PM By: [name] Entered By: [name] on 05/24/2022 13:54:21 -------------------------------------------------------------------------------- Foot Assessment Details Patient Name: Date of Service: [name] RD L. 05/24/2022 1:15 PM Medical Record Number: [id number] Patient Account Number: [id number] Date of Birth/Sex: Treating RN: 12/09/1955 (66 y.o. [name] Primary Care Andraya Frigon: [name] Other Clinician: Referring Jim Philemon: Treating Katarzyna Wolven/Extender: Larwance Sachs in Treatment: 0 Foot Assessment Items Site Locations + = Sensation present, - = Sensation absent, C = Callus, U = Ulcer R = Redness, W = Warmth, M = Maceration, PU = Pre-ulcerative lesion F = Fissure, S = Swelling, D = Dryness Assessment Right: Left: Other Deformity: No No Prior Foot Ulcer: No No Prior Amputation: No No Charcot Joint: No No Ambulatory Status: Ambulatory With Help Assistance Device: Cane Gait: Steady Electronic Signature(s) Signed: 05/24/2022 5:18:00 PM By: [name] Entered By: [name] on 05/24/2022 13:57:02 -------------------------------------------------------------------------------- Nutrition Risk Screening Details Patient Name: Date of ServiceLissa [name] RD L. 05/24/2022 1:15 PM Medical Record Number: [id number] Patient Account Number: [id number] Date of Birth/Sex: Treating RN: 20-Nov-1955 (66 y.o. [name] Primary Care Sueanne Maniaci: [name] Other Clinician: Referring Glynda Soliday: Treating Nakayla Rorabaugh/Extender: Larwance Sachs in Treatment: 0 Height (in): 70 Weight (lbs): 253 Body Mass Index (BMI): 36.3 Nutrition Risk Screening Items Score Screening NUTRITION RISK SCREEN: I have an illness or condition that made me change the kind and/or amount of food I eat 0 No I eat fewer than two meals per day 0 No I eat few fruits and vegetables, or milk products 0 No I have three or more drinks of beer, liquor or wine almost every day 0 No I have tooth or mouth problems that make it hard for me to eat 0 No I don't always have enough money to buy the food I need 0 No I eat alone most of the time 0 No I take three or more different prescribed or over-the-counter drugs a day 1 Yes Without wanting to, I have lost or gained 10 pounds in the last six months 0 No I am not always physically able to shop, cook and/or feed myself 0 No Nutrition Protocols Good Risk Protocol 0 No interventions needed Moderate Risk Protocol High Risk Proctocol Risk Level: Good Risk Score: 1 Electronic Signature(s) Signed: 05/24/2022 5:18:00 PM By: [name] Entered By: [name] on 05/24/2022 13:54:28

## 2022-05-25 NOTE — Progress Notes (Signed)
[name], [name] ([id number]) Visit Report for 05/24/2022 Chief Complaint Document Details Patient Name: Date of Service: [name] RD L. 05/24/2022 1:15 PM Medical Record Number: [id number] Patient Account Number: [id number] Date of Birth/Sex: Treating RN: 30-Dec-1955 (66 y.o. [name] Primary Care Provider: [name] Other Clinician: Referring Provider: Treating Provider/Extender: Larwance Sachs in Treatment: 0 Information Obtained from: Patient Chief Complaint 05/24/2022; right lower extremity wound Electronic Signature(s) Signed: 05/25/2022 1:41:40 PM By: [name] Entered By: Kalman Shan on 05/24/2022 14:56:05 -------------------------------------------------------------------------------- Debridement Details Patient Name: Date of Service: [name] RD L. 05/24/2022 1:15 PM Medical Record Number: [id number] Patient Account Number: [id number] Date of Birth/Sex: Treating RN: 1956-08-26 (66 y.o. [name] Primary Care Provider: [name] Other Clinician: Referring Provider: Treating Provider/Extender: Larwance Sachs in Treatment: 0 Debridement Performed for Assessment: Wound #3 Right,Medial Lower Leg Performed By: Physician Kalman Shan, DO Debridement Type: Debridement Severity of Tissue Pre Debridement: Fat layer exposed Level of Consciousness (Pre-procedure): Awake and Alert Pre-procedure Verification/Time Out Yes - 14:34 Taken: Start Time: 14:35 Pain Control: Lidocaine 4% T opical Solution T Area Debrided (L x W): otal 2 (cm) x 1.9 (cm) = 3.8 (cm) Tissue and other material debrided: Non-Viable, Slough, Subcutaneous, Slough Level: Skin/Subcutaneous Tissue Debridement Description: Excisional Instrument: Curette Bleeding: Minimum Hemostasis Achieved: Pressure End Time: 14:39 Response to Treatment: Procedure was tolerated well Level of Consciousness (Post- Awake and  Alert procedure): Post Debridement Measurements of Total Wound Length: (cm) 2 Width: (cm) 1.9 Depth: (cm) 0.2 Volume: (cm) 0.597 Character of Wound/Ulcer Post Debridement: Stable Severity of Tissue Post Debridement: Fat layer exposed Post Procedure Diagnosis Same as Pre-procedure Notes Procedure scribed for Dr. Heber [place] by Janan Halter, RN Electronic Signature(s) Signed: 05/24/2022 5:18:00 PM By: [name] Signed: 05/25/2022 1:41:40 PM By: [name] Entered By: [name] on 05/24/2022 14:39:28 -------------------------------------------------------------------------------- HPI Details Patient Name: Date of Service: [name] RD L. 05/24/2022 1:15 PM Medical Record Number: [id number] Patient Account Number: [id number] Date of Birth/Sex: Treating RN: 1956-02-10 (66 y.o. [name] Primary Care Provider: [name] Other Clinician: Referring Provider: Treating Provider/Extender: Larwance Sachs in Treatment: 0 History of Present Illness HPI Description: Admission 5/2 [name] is a 66 year old male with a past medical history of systolic congestive heart failure, COPD, nonischemic cardiomyopathy, type 2 diabetes, chronic venous insufficiency that presents to the clinic for a 36-monthhistory of nonhealing wound to his left lower extremity. He states that in November 2021 he fell off his porch and Hit his leg against a brick. The wound has healed mostly except for 1 area that is still open. He reports moderate Serosanguineous drainage daily. He has been keeping it covered with Band-Aids. He reports 2 rounds of antibiotics for this issue. He denies pain. He denies current acute signs of infection. 5/9; patient presents for 1 week follow-up. He has been using Hydrofera Blue under 3 layer compression. He has no issues or complaints today. He denies signs of infection. 5/16; patient presents for 1 week follow-up. He has been using Hydrofera  Blue under 3 layer compression. He has no issues or complaints today. He denies signs of infection. He states he overall feels well 5/23; patient presents for 1 week follow-up. He has been using Hydrofera Blue under 3 layer compression. He has no issues or complaints today. He denies signs of infection. 6/6; patient presents for 2-week follow-up. He has been using Hydrofera Blue under 3 layer  compression. He has had no issues with this. He denies any signs of infection and has no complaints today. Readmissiondifferent wound 6/27 patient developed another wound to his right lower extremity. He states that 1 week ago he had a large admission for a different wound Paining that was hung over his couch fell on his leg. He has been using alcohol and keeping the area covered. He denies signs of infection. 7/5; patient presents for 1 week follow-up. He has been tolerating Hydrofera Blue under 3 layer compression. He does report some pain to the wound site. He denies signs of infection. 7/11; patient presents for 1 week follow-up. He has been doing well with Hydrofera Blue under 3 layer compression. He reports improvement in pain since last clinic visit. He has no complaints today. He denies signs of infection. 7/18; patient presents for 1 week follow-up. Has been doing well with Hydrofera Blue under 3 layer compression. He denies signs of infection. Admission 05/24/2022 Mr. Damari [name] is a 66 year old male with a past medical history of venous insufficiency that presents to the clinic for a 1 month history of nonhealing wound to his right lower extremity. He states that a wagon wheel hit it and caused a wound. He has been treated with Keflex for this issue By his podiatrist. He has been using silver alginate to the wound bed with dressing changes. He currently denies signs of infection. He is not wearing compression stockings. Electronic Signature(s) Signed: 05/25/2022 1:41:40 PM By: Kalman Shan  DO Entered By: Kalman Shan on 05/24/2022 15:04:13 -------------------------------------------------------------------------------- Physical Exam Details Patient Name: Date of Service: [name] RD L. 05/24/2022 1:15 PM Medical Record Number: [id number] Patient Account Number: [id number] Date of Birth/Sex: Treating RN: Feb 13, 1956 (66 y.o. [name] Primary Care Provider: [name] Other Clinician: Referring Provider: Treating Provider/Extender: Larwance Sachs in Treatment: 0 Constitutional respirations regular, non-labored and within target range for patient.. Cardiovascular 2+ dorsalis pedis/posterior tibialis pulses. Psychiatric pleasant and cooperative. Notes Right lower extremity: T the Proximal Medial aspect there is an open wound with granulation tissue and nonviable tissue. Venous stasis dermatitis o Electronic Signature(s) Signed: 05/25/2022 1:41:40 PM By: [name] Entered By: Kalman Shan on 05/24/2022 15:14:21 -------------------------------------------------------------------------------- Physician Orders Details Patient Name: Date of Service: [name] RD L. 05/24/2022 1:15 PM Medical Record Number: [id number] Patient Account Number: [id number] Date of Birth/Sex: Treating RN: 08-Jan-1956 (66 y.o. [name] Primary Care Provider: [name] Other Clinician: Referring Provider: Treating Provider/Extender: Larwance Sachs in Treatment: 0 Verbal / Phone Orders: No Diagnosis Coding ICD-10 Coding Code Description I87.311 Chronic venous hypertension (idiopathic) with ulcer of right lower extremity L97.812 Non-pressure chronic ulcer of other part of right lower leg with fat layer exposed V76.16 Chronic systolic (congestive) heart failure I10 Essential (primary) hypertension I42.8 Other cardiomyopathies J44.9 Chronic obstructive pulmonary disease, unspecified I48.19 Other persistent  atrial fibrillation E11.622 Type 2 diabetes mellitus with other skin ulcer T79.8XXA Other early complications of trauma, initial encounter Follow-up Appointments ppointment in 1 week. - 05/29/22 @ 2:45pm Return A Anesthetic (In clinic) Topical Lidocaine 5% applied to wound bed (In clinic) Topical Lidocaine 4% applied to wound bed Bathing/ Shower/ Hygiene May shower with protection but do not get wound dressing(s) wet. - May shower using cast protector bag. Edema Control - Lymphedema / SCD / Other Elevate legs to the level of the heart or above for 30 minutes daily and/or when sitting, a frequency of: - several times a  day Avoid standing for long periods of time. Additional Orders / Instructions Follow Nutritious Diet Wound Treatment Wound #3 - Lower Leg Wound Laterality: Right, Medial Cleanser: Soap and Water 1 x Per Week/30 Days Discharge Instructions: May shower and wash wound with dial antibacterial soap and water prior to dressing change. Cleanser: Wound Cleanser 1 x Per Week/30 Days Discharge Instructions: Cleanse the wound with wound cleanser prior to applying a clean dressing using gauze sponges, not tissue or cotton balls. Peri-Wound Care: Triamcinolone 15 (g) 1 x Per Week/30 Days Discharge Instructions: Use triamcinolone 15 (g) as directed Peri-Wound Care: Sween Lotion (Moisturizing lotion) 1 x Per Week/30 Days Discharge Instructions: Apply moisturizing lotion as directed Topical: Gentamicin 1 x Per Week/30 Days Discharge Instructions: As directed by physician Topical: Mupirocin Ointment 1 x Per Week/30 Days Discharge Instructions: Apply Mupirocin (Bactroban) as instructed Prim Dressing: Hydrofera Blue Ready Foam, 2.5 x2.5 in 1 x Per Week/30 Days ary Discharge Instructions: Apply to wound bed as instructed Secondary Dressing: Zetuvit Plus 4x8 in 1 x Per Week/30 Days Discharge Instructions: Apply over primary dressing as directed. Compression Wrap: ThreePress (3 layer  compression wrap) 1 x Per Week/30 Days Discharge Instructions: Apply three layer compression as directed. Electronic Signature(s) Signed: 05/25/2022 1:41:40 PM By: [name] Entered By: Kalman Shan on 05/24/2022 15:14:32 -------------------------------------------------------------------------------- Problem List Details Patient Name: Date of Service: [name], [name] 05/24/2022 1:15 PM Medical Record Number: [id number] Patient Account Number: [id number] Date of Birth/Sex: Treating RN: 10-11-1956 (66 y.o. [name] Primary Care Provider: [name] Other Clinician: Referring Provider: Treating Provider/Extender: Larwance Sachs in Treatment: 0 Active Problems ICD-10 Encounter Code Description Active Date MDM Diagnosis I87.311 Chronic venous hypertension (idiopathic) with ulcer of right lower extremity 05/24/2022 No Yes L97.812 Non-pressure chronic ulcer of other part of right lower leg with fat layer 05/24/2022 No Yes exposed W09.81 Chronic systolic (congestive) heart failure 05/24/2022 No Yes I10 Essential (primary) hypertension 05/24/2022 No Yes I42.8 Other cardiomyopathies 05/24/2022 No Yes J44.9 Chronic obstructive pulmonary disease, unspecified 05/24/2022 No Yes I48.19 Other persistent atrial fibrillation 05/24/2022 No Yes E11.622 Type 2 diabetes mellitus with other skin ulcer 05/24/2022 No Yes T79.8XXA Other early complications of trauma, initial encounter 05/24/2022 No Yes Inactive Problems Resolved Problems Electronic Signature(s) Signed: 05/25/2022 1:41:40 PM By: [name] Entered By: Kalman Shan on 05/24/2022 14:55:23 -------------------------------------------------------------------------------- Progress Note Details Patient Name: Date of Service: [name] RD L. 05/24/2022 1:15 PM Medical Record Number: [id number] Patient Account Number: [id number] Date of Birth/Sex: Treating RN: 1956/03/14 (66 y.o. [name] Primary Care Provider: [name] Other Clinician: Referring Provider: Treating Provider/Extender: Larwance Sachs in Treatment: 0 Subjective Chief Complaint Information obtained from Patient 05/24/2022; right lower extremity wound History of Present Illness (HPI) Admission 5/2 [name] is a 67 year old male with a past medical history of systolic congestive heart failure, COPD, nonischemic cardiomyopathy, type 2 diabetes, chronic venous insufficiency that presents to the clinic for a 3-monthhistory of nonhealing wound to his left lower extremity. He states that in November 2021 he fell off his porch and Hit his leg against a brick. The wound has healed mostly except for 1 area that is still open. He reports moderate Serosanguineous drainage daily. He has been keeping it covered with Band-Aids. He reports 2 rounds of antibiotics for this issue. He denies pain. He denies current acute signs of infection. 5/9; patient presents for 1 week follow-up. He has been using [employer]  under 3 layer compression. He has no issues or complaints today. He denies signs of infection. 5/16; patient presents for 1 week follow-up. He has been using Hydrofera Blue under 3 layer compression. He has no issues or complaints today. He denies signs of infection. He states he overall feels well 5/23; patient presents for 1 week follow-up. He has been using Hydrofera Blue under 3 layer compression. He has no issues or complaints today. He denies signs of infection. 6/6; patient presents for 2-week follow-up. He has been using Hydrofera Blue under 3 layer compression. He has had no issues with this. He denies any signs of infection and has no complaints today. Readmissionoodifferent wound 6/27 patient developed another wound to his right lower extremity. He states that 1 week ago he had a large admission for a different wound Paining that was hung over his couch fell on his  leg. He has been using alcohol and keeping the area covered. He denies signs of infection. 7/5; patient presents for 1 week follow-up. He has been tolerating Hydrofera Blue under 3 layer compression. He does report some pain to the wound site. He denies signs of infection. 7/11; patient presents for 1 week follow-up. He has been doing well with Hydrofera Blue under 3 layer compression. He reports improvement in pain since last clinic visit. He has no complaints today. He denies signs of infection. 7/18; patient presents for 1 week follow-up. Has been doing well with Hydrofera Blue under 3 layer compression. He denies signs of infection. Admission 05/24/2022 [name] is a 66 year old male with a past medical history of venous insufficiency that presents to the clinic for a 1 month history of nonhealing wound to his right lower extremity. He states that a wagon wheel hit it and caused a wound. He has been treated with Keflex for this issue By his podiatrist. He has been using silver alginate to the wound bed with dressing changes. He currently denies signs of infection. He is not wearing compression stockings. Patient History Information obtained from Patient, Chart. Allergies No Known Allergies Family History Heart Disease - Father, Hypertension - Mother,Father,Siblings, Lung Disease - Mother, Thyroid Problems - Mother, No family history of Cancer, Diabetes, Hereditary Spherocytosis, Kidney Disease, Seizures, Stroke, Tuberculosis. Social History Former smoker - quit 2009, Marital Status - Divorced, Alcohol Use - Never, Drug Use - No History, Caffeine Use - Daily - coffee, diet soda. Medical History Eyes Patient has history of Cataracts, Glaucoma Denies history of Optic Neuritis Hematologic/Lymphatic Patient has history of Anemia Respiratory Patient has history of Asthma, Chronic Obstructive Pulmonary Disease (COPD), Sleep Apnea - sleeps in recliner Denies history of  Pneumothorax Cardiovascular Patient has history of Arrhythmia - afib, Congestive Heart Failure, Deep Vein Thrombosis, Myocardial Infarction, Peripheral Venous Disease Endocrine Patient has history of Type II Diabetes Denies history of Type I Diabetes Genitourinary Denies history of End Stage Renal Disease Integumentary (Skin) Denies history of History of Burn Musculoskeletal Patient has history of Osteoarthritis Oncologic Denies history of Received Chemotherapy, Received Radiation Psychiatric Denies history of Anorexia/bulimia, Confinement Anxiety Medical A Surgical History Notes nd Constitutional Symptoms (General Health) obesity Cardiovascular cardiomyopathy, pacemaker/defibulator Gastrointestinal GERD Endocrine thyrotoxicosis Genitourinary hx kidney stones, BPH Musculoskeletal thoracic and lumbosacral spondylosis Review of Systems (ROS) Eyes Complains or has symptoms of Glasses / Contacts. Ear/Nose/Mouth/Throat Denies complaints or symptoms of Chronic sinus problems or rhinitis. Endocrine Complains or has symptoms of Heat/cold intolerance. Genitourinary Denies complaints or symptoms of Frequent urination. Integumentary (Skin) Complains or has symptoms of  Wounds - Right Calf. Neurologic Denies complaints or symptoms of Numbness/parasthesias. Psychiatric Denies complaints or symptoms of Claustrophobia, Suicidal. Objective Constitutional respirations regular, non-labored and within target range for patient.. Vitals Time Taken: 1:49 PM, Height: 70 in, Source: Stated, Weight: 253 lbs, Source: Stated, BMI: 36.3, Temperature: 97.8 F, Pulse: 76 bpm, Respiratory Rate: 18 breaths/min, Blood Pressure: 94/64 mmHg. Cardiovascular 2+ dorsalis pedis/posterior tibialis pulses. Psychiatric pleasant and cooperative. General Notes: Right lower extremity: T the Proximal Medial aspect there is an open wound with granulation tissue and nonviable tissue. Venous  stasis o dermatitis Integumentary (Hair, Skin) Wound #3 status is Open. Original cause of wound was Trauma. The date acquired was: 04/15/2022. The wound is located on the Right,Medial Lower Leg. The wound measures 2cm length x 1.9cm width x 0.2cm depth; 2.985cm^2 area and 0.597cm^3 volume. There is Fat Layer (Subcutaneous Tissue) exposed. There is no tunneling or undermining noted. There is a medium amount of serosanguineous drainage noted. The wound margin is distinct with the outline attached to the wound base. There is medium (34-66%) red granulation within the wound bed. There is a medium (34-66%) amount of necrotic tissue within the wound bed including Adherent Slough. General Notes: Periwound erythema Assessment Active Problems ICD-10 Chronic venous hypertension (idiopathic) with ulcer of right lower extremity Non-pressure chronic ulcer of other part of right lower leg with fat layer exposed Chronic systolic (congestive) heart failure Essential (primary) hypertension Other cardiomyopathies Chronic obstructive pulmonary disease, unspecified Other persistent atrial fibrillation Type 2 diabetes mellitus with other skin ulcer Other early complications of trauma, initial encounter Patient presents with a 1 month history of nonhealing ulcer to the right lower extremity caused by trauma and delayed in wound healing by venous insufficiency. I debrided nonviable tissue. ABIs in office were 1.28 on the right and he has palpable pedal pulses suggesting adequate blood flow. I recommended at this time mupirocin/gentamicin to address bioburden with Hydrofera Blue under 3 layer compression. Procedures Wound #3 Pre-procedure diagnosis of Wound #3 is a Venous Leg Ulcer located on the Right,Medial Lower Leg .Severity of Tissue Pre Debridement is: Fat layer exposed. There was a Excisional Skin/Subcutaneous Tissue Debridement with a total area of 3.8 sq cm performed by Kalman Shan, DO. With the  following instrument(s): Curette to remove Non-Viable tissue/material. Material removed includes Subcutaneous Tissue and Slough and after achieving pain control using Lidocaine 4% T opical Solution. No specimens were taken. A time out was conducted at 14:34, prior to the start of the procedure. A Minimum amount of bleeding was controlled with Pressure. The procedure was tolerated well. Post Debridement Measurements: 2cm length x 1.9cm width x 0.2cm depth; 0.597cm^3 volume. Character of Wound/Ulcer Post Debridement is stable. Severity of Tissue Post Debridement is: Fat layer exposed. Post procedure Diagnosis Wound #3: Same as Pre-Procedure General Notes: Procedure scribed for Dr. Heber [place] by Janan Halter, RN. Pre-procedure diagnosis of Wound #3 is a Venous Leg Ulcer located on the Right,Medial Lower Leg . There was a Three Layer Compression Therapy Procedure by [name], RN. Post procedure Diagnosis Wound #3: Same as Pre-Procedure Plan Follow-up Appointments: Return Appointment in 1 week. - 05/29/22 @ 2:45pm Anesthetic: (In clinic) Topical Lidocaine 5% applied to wound bed (In clinic) Topical Lidocaine 4% applied to wound bed Bathing/ Shower/ Hygiene: May shower with protection but do not get wound dressing(s) wet. - May shower using cast protector bag. Edema Control - Lymphedema / SCD / Other: Elevate legs to the level of the heart or above for 30 minutes daily  and/or when sitting, a frequency of: - several times a day Avoid standing for long periods of time. Additional Orders / Instructions: Follow Nutritious Diet WOUND #3: - Lower Leg Wound Laterality: Right, Medial Cleanser: Soap and Water 1 x Per Week/30 Days Discharge Instructions: May shower and wash wound with dial antibacterial soap and water prior to dressing change. Cleanser: Wound Cleanser 1 x Per Week/30 Days Discharge Instructions: Cleanse the wound with wound cleanser prior to applying a clean dressing using gauze  sponges, not tissue or cotton balls. Peri-Wound Care: Triamcinolone 15 (g) 1 x Per Week/30 Days Discharge Instructions: Use triamcinolone 15 (g) as directed Peri-Wound Care: Sween Lotion (Moisturizing lotion) 1 x Per Week/30 Days Discharge Instructions: Apply moisturizing lotion as directed Topical: Gentamicin 1 x Per Week/30 Days Discharge Instructions: As directed by physician Topical: Mupirocin Ointment 1 x Per Week/30 Days Discharge Instructions: Apply Mupirocin (Bactroban) as instructed Prim Dressing: Hydrofera Blue Ready Foam, 2.5 x2.5 in 1 x Per Week/30 Days ary Discharge Instructions: Apply to wound bed as instructed Secondary Dressing: Zetuvit Plus 4x8 in 1 x Per Week/30 Days Discharge Instructions: Apply over primary dressing as directed. Com pression Wrap: ThreePress (3 layer compression wrap) 1 x Per Week/30 Days Discharge Instructions: Apply three layer compression as directed. 1. In office sharp debridement 2. Antibiotic ointment with Hydrofera Blue under 3 layer compression 3. Follow-up in 1 week Electronic Signature(s) Signed: 05/25/2022 1:41:40 PM By: [name] Entered By: Kalman Shan on 05/24/2022 15:17:11 -------------------------------------------------------------------------------- HxROS Details Patient Name: Date of Service: [name], [name] 05/24/2022 1:15 PM Medical Record Number: [id number] Patient Account Number: [id number] Date of Birth/Sex: Treating RN: 07-May-1956 (66 y.o. [name] Primary Care Provider: [name] Other Clinician: Referring Provider: Treating Provider/Extender: Larwance Sachs in Treatment: 0 Information Obtained From Patient Chart Eyes Complaints and Symptoms: Positive for: Glasses / Contacts Medical History: Positive for: Cataracts; Glaucoma Negative for: Optic Neuritis Ear/Nose/Mouth/Throat Complaints and Symptoms: Negative for: Chronic sinus problems or  rhinitis Endocrine Complaints and Symptoms: Positive for: Heat/cold intolerance Medical History: Positive for: Type II Diabetes Negative for: Type I Diabetes Past Medical History Notes: thyrotoxicosis Time with diabetes: 10 yrs Treated with: Diet Blood sugar tested every day: No Genitourinary Complaints and Symptoms: Negative for: Frequent urination Medical History: Negative for: End Stage Renal Disease Past Medical History Notes: hx kidney stones, BPH Integumentary (Skin) Complaints and Symptoms: Positive for: Wounds - Right Calf Medical History: Negative for: History of Burn Neurologic Complaints and Symptoms: Negative for: Numbness/parasthesias Psychiatric Complaints and Symptoms: Negative for: Claustrophobia; Suicidal Medical History: Negative for: Anorexia/bulimia; Confinement Anxiety Constitutional Symptoms (General Health) Medical History: Past Medical History Notes: obesity Hematologic/Lymphatic Medical History: Positive for: Anemia Respiratory Medical History: Positive for: Asthma; Chronic Obstructive Pulmonary Disease (COPD); Sleep Apnea - sleeps in recliner Negative for: Pneumothorax Cardiovascular Medical History: Positive for: Arrhythmia - afib; Congestive Heart Failure; Deep Vein Thrombosis; Myocardial Infarction; Peripheral Venous Disease Past Medical History Notes: cardiomyopathy, pacemaker/defibulator Gastrointestinal Medical History: Past Medical History Notes: GERD Immunological Musculoskeletal Medical History: Positive for: Osteoarthritis Past Medical History Notes: thoracic and lumbosacral spondylosis Oncologic Medical History: Negative for: Received Chemotherapy; Received Radiation HBO Extended History Items Eyes: Eyes: Cataracts Glaucoma Immunizations Pneumococcal Vaccine: Received Pneumococcal Vaccination: Yes Received Pneumococcal Vaccination On or After 60th Birthday: Yes Implantable Devices Yes Family and Social  History Cancer: No; Diabetes: No; Heart Disease: Yes - Father; Hereditary Spherocytosis: No; Hypertension: Yes - Mother,Father,Siblings; Kidney Disease: No; Lung Disease: Yes - Mother; Seizures: No;  Stroke: No; Thyroid Problems: Yes - Mother; Tuberculosis: No; Former smoker - quit 2009; Marital Status - Divorced; Alcohol Use: Never; Drug Use: No History; Caffeine Use: Daily - coffee, diet soda; Financial Concerns: No; Food, Clothing or Shelter Needs: No; Support System Lacking: No; Transportation Concerns: No Electronic Signature(s) Signed: 05/24/2022 5:18:00 PM By: [name] Signed: 05/25/2022 1:41:40 PM By: [name] Previous Signature: 05/24/2022 12:56:03 PM Version By: [name] Entered By: [name] on 05/24/2022 13:53:01 -------------------------------------------------------------------------------- SuperBill Details Patient Name: Date of Service: [name] RD L. 05/24/2022 Medical Record Number: [id number] Patient Account Number: [id number] Date of Birth/Sex: Treating RN: Aug 13, 1956 (66 y.o. [name] Primary Care Provider: [name] Other Clinician: Referring Provider: Treating Provider/Extender: Larwance Sachs in Treatment: 0 Diagnosis Coding ICD-10 Codes Code Description [phone] Chronic venous hypertension (idiopathic) with ulcer of right lower extremity L97.812 Non-pressure chronic ulcer of other part of right lower leg with fat layer exposed Q22.97 Chronic systolic (congestive) heart failure I10 Essential (primary) hypertension I42.8 Other cardiomyopathies J44.9 Chronic obstructive pulmonary disease, unspecified I48.19 Other persistent atrial fibrillation E11.622 Type 2 diabetes mellitus with other skin ulcer T79.8XXA Other early complications of trauma, initial encounter Facility Procedures CPT4 Code: [id number] Description: 99213 - WOUND CARE VISIT-LEV 3 EST PT Modifier: 25 Quantity: 1 CPT4 Code:  [id number] Description: 81856 - DEB SUBQ TISSUE 20 SQ CM/< ICD-10 Diagnosis Description D14.970 Non-pressure chronic ulcer of other part of right lower leg with fat layer expos Modifier: ed Quantity: 1 Physician Procedures : CPT4 Code Description Modifier [id number] 99214 - WC PHYS LEVEL 4 - EST PT ICD-10 Diagnosis Description I87.311 Chronic venous hypertension (idiopathic) with ulcer of right lower extremity L97.812 Non-pressure chronic ulcer of other part of right lower  leg with fat layer exposed E11.622 Type 2 diabetes mellitus with other skin ulcer T79.8XXA Other early complications of trauma, initial encounter Quantity: 1 Electronic Signature(s) Signed: 05/25/2022 1:41:40 PM By: [name] Entered By: Kalman Shan on 05/24/2022 15:17:33

## 2022-05-29 ENCOUNTER — Encounter (HOSPITAL_BASED_OUTPATIENT_CLINIC_OR_DEPARTMENT_OTHER): Payer: Medicare HMO | Admitting: Internal Medicine

## 2022-05-29 DIAGNOSIS — J449 Chronic obstructive pulmonary disease, unspecified: Secondary | ICD-10-CM | POA: Diagnosis not present

## 2022-05-29 DIAGNOSIS — I872 Venous insufficiency (chronic) (peripheral): Secondary | ICD-10-CM | POA: Diagnosis not present

## 2022-05-29 DIAGNOSIS — L97812 Non-pressure chronic ulcer of other part of right lower leg with fat layer exposed: Secondary | ICD-10-CM

## 2022-05-29 DIAGNOSIS — I4819 Other persistent atrial fibrillation: Secondary | ICD-10-CM | POA: Diagnosis not present

## 2022-05-29 DIAGNOSIS — I110 Hypertensive heart disease with heart failure: Secondary | ICD-10-CM | POA: Diagnosis not present

## 2022-05-29 DIAGNOSIS — E11622 Type 2 diabetes mellitus with other skin ulcer: Secondary | ICD-10-CM | POA: Diagnosis not present

## 2022-05-29 DIAGNOSIS — I5022 Chronic systolic (congestive) heart failure: Secondary | ICD-10-CM | POA: Diagnosis not present

## 2022-05-29 DIAGNOSIS — I87311 Chronic venous hypertension (idiopathic) with ulcer of right lower extremity: Secondary | ICD-10-CM | POA: Diagnosis not present

## 2022-05-29 DIAGNOSIS — I428 Other cardiomyopathies: Secondary | ICD-10-CM | POA: Diagnosis not present

## 2022-05-30 ENCOUNTER — Ambulatory Visit: Payer: Medicare HMO | Admitting: Orthopaedic Surgery

## 2022-05-30 NOTE — Progress Notes (Signed)
SHRAY, HUNLEY ([id number]) Visit Report for 05/29/2022 Chief Complaint Document Details Patient Name: Date of Service: [name] RD L. 05/29/2022 2:45 PM Medical Record Number: [id number] Patient Account Number: [id number] Date of Birth/Sex: Treating RN: 06/04/1956 (66 y.o. [name] Primary Care Provider: [name] Other Clinician: Referring Provider: Treating Provider/Extender: [name] in Treatment: 0 Information Obtained from: Patient Chief Complaint 05/24/2022; right lower extremity wound Electronic Signature(s) Signed: 05/29/2022 4:20:15 PM By: [name] Entered By: Kalman Shan on 05/29/2022 15:55:32 -------------------------------------------------------------------------------- Debridement Details Patient Name: Date of Service: [name] RD L. 05/29/2022 2:45 PM Medical Record Number: [id number] Patient Account Number: [id number] Date of Birth/Sex: Treating RN: 04/11/1956 (66 y.o. [name] Primary Care Provider: [name] Other Clinician: Referring Provider: Treating Provider/Extender: [name] in Treatment: 0 Debridement Performed for Assessment: Wound #3 Right,Medial Lower Leg Performed By: Physician Kalman Shan, DO Debridement Type: Debridement Severity of Tissue Pre Debridement: Fat layer exposed Level of Consciousness (Pre-procedure): Awake and Alert Pre-procedure Verification/Time Out Yes - 15:22 Taken: Start Time: 15:23 Pain Control: Lidocaine 4% T opical Solution T Area Debrided (L x W): otal 2 (cm) x 1.7 (cm) = 3.4 (cm) Tissue and other material debrided: Non-Viable, Slough, Subcutaneous, Slough Level: Skin/Subcutaneous Tissue Debridement Description: Excisional Instrument: Curette Bleeding: Minimum Hemostasis Achieved: Pressure End Time: 15:28 Response to Treatment: Procedure was tolerated well Level of Consciousness (Post- Awake and Alert procedure): Post  Debridement Measurements of Total Wound Length: (cm) 2 Width: (cm) 1.7 Depth: (cm) 0.2 Volume: (cm) 0.534 Character of Wound/Ulcer Post Debridement: Stable Severity of Tissue Post Debridement: Fat layer exposed Post Procedure Diagnosis Same as Pre-procedure Electronic Signature(s) Signed: 05/29/2022 4:20:15 PM By: [name] Signed: 05/29/2022 5:00:27 PM By: [name] Entered By: [name] on 05/29/2022 15:28:50 -------------------------------------------------------------------------------- HPI Details Patient Name: Date of Service: [name] RD L. 05/29/2022 2:45 PM Medical Record Number: [id number] Patient Account Number: [id number] Date of Birth/Sex: Treating RN: 1956/03/29 (66 y.o. [name] Primary Care Provider: [name] Other Clinician: Referring Provider: Treating Provider/Extender: [name] in Treatment: 0 History of Present Illness HPI Description: Admission 5/2 [name] is a 66 year old male with a past medical history of systolic congestive heart failure, COPD, nonischemic cardiomyopathy, type 2 diabetes, chronic venous insufficiency that presents to the clinic for a 58-monthhistory of nonhealing wound to his left lower extremity. He states that in November 2021 he fell off his porch and Hit his leg against a brick. The wound has healed mostly except for 1 area that is still open. He reports moderate Serosanguineous drainage daily. He has been keeping it covered with Band-Aids. He reports 2 rounds of antibiotics for this issue. He denies pain. He denies current acute signs of infection. 5/9; patient presents for 1 week follow-up. He has been using Hydrofera Blue under 3 layer compression. He has no issues or complaints today. He denies signs of infection. 5/16; patient presents for 1 week follow-up. He has been using Hydrofera Blue under 3 layer compression. He has no issues or complaints today. He denies signs of  infection. He states he overall feels well 5/23; patient presents for 1 week follow-up. He has been using Hydrofera Blue under 3 layer compression. He has no issues or complaints today. He denies signs of infection. 6/6; patient presents for 2-week follow-up. He has been using Hydrofera Blue under 3 layer compression. He has had no issues with this. He denies  any signs of infection and has no complaints today. Readmissiondifferent wound 6/27 patient developed another wound to his right lower extremity. He states that 1 week ago he had a large admission for a different wound Paining that was hung over his couch fell on his leg. He has been using alcohol and keeping the area covered. He denies signs of infection. 7/5; patient presents for 1 week follow-up. He has been tolerating Hydrofera Blue under 3 layer compression. He does report some pain to the wound site. He denies signs of infection. 7/11; patient presents for 1 week follow-up. He has been doing well with Hydrofera Blue under 3 layer compression. He reports improvement in pain since last clinic visit. He has no complaints today. He denies signs of infection. 7/18; patient presents for 1 week follow-up. Has been doing well with Hydrofera Blue under 3 layer compression. He denies signs of infection. Admission 05/24/2022 [name] is a 66 year old male with a past medical history of venous insufficiency that presents to the clinic for a 1 month history of nonhealing wound to his right lower extremity. He states that a wagon wheel hit it and caused a wound. He has been treated with Keflex for this issue By his podiatrist. He has been using silver alginate to the wound bed with dressing changes. He currently denies signs of infection. He is not wearing compression stockings. 8/15; patient presents for follow-up. We have been using antibiotic ointment with Hydrofera Blue under 3 layer compression. He tolerated the compression wrap well. He  has no issues or complaints today. He denies signs of infection. Electronic Signature(s) Signed: 05/29/2022 4:20:15 PM By: [name] Entered By: Kalman Shan on 05/29/2022 15:56:02 -------------------------------------------------------------------------------- Physical Exam Details Patient Name: Date of Service: [name] RD L. 05/29/2022 2:45 PM Medical Record Number: [id number] Patient Account Number: [id number] Date of Birth/Sex: Treating RN: 05/23/1956 (66 y.o. [name] Primary Care Provider: [name] Other Clinician: Referring Provider: Treating Provider/Extender: [name] in Treatment: 0 Constitutional respirations regular, non-labored and within target range for patient.. Cardiovascular 2+ dorsalis pedis/posterior tibialis pulses. Psychiatric pleasant and cooperative. Notes Right lower extremity: T the Proximal Medial aspect there is an open wound with granulation tissue and nonviable tissue. Venous stasis dermatitis o Electronic Signature(s) Signed: 05/29/2022 4:20:15 PM By: [name] Entered By: Kalman Shan on 05/29/2022 15:56:24 -------------------------------------------------------------------------------- Physician Orders Details Patient Name: Date of Service: [name] RD L. 05/29/2022 2:45 PM Medical Record Number: [id number] Patient Account Number: [id number] Date of Birth/Sex: Treating RN: [birth year] (66 y.o. [name] Primary Care Provider: [name] Other Clinician: Referring Provider: Treating Provider/Extender: [name] in Treatment: 0 Verbal / Phone Orders: No Diagnosis Coding ICD-10 Coding Code Description I87.311 Chronic venous hypertension (idiopathic) with ulcer of right lower extremity L97.812 Non-pressure chronic ulcer of other part of right lower leg with fat layer exposed N56.21 Chronic systolic (congestive) heart failure I10 Essential  (primary) hypertension I42.8 Other cardiomyopathies J44.9 Chronic obstructive pulmonary disease, unspecified I48.19 Other persistent atrial fibrillation E11.622 Type 2 diabetes mellitus with other skin ulcer T79.8XXA Other early complications of trauma, initial encounter Follow-up Appointments ppointment in 1 week. - 06/05/22 @ 2:45pm Return A Anesthetic (In clinic) Topical Lidocaine 5% applied to wound bed (In clinic) Topical Lidocaine 4% applied to wound bed Bathing/ Shower/ Hygiene May shower with protection but do not get wound dressing(s) wet. - May shower using cast protector bag. Edema Control - Lymphedema / SCD /  Other Elevate legs to the level of the heart or above for 30 minutes daily and/or when sitting, a frequency of: - several times a day Avoid standing for long periods of time. Additional Orders / Instructions Follow Nutritious Diet Wound Treatment Wound #3 - Lower Leg Wound Laterality: Right, Medial Cleanser: Soap and Water 1 x Per Week/30 Days Discharge Instructions: May shower and wash wound with dial antibacterial soap and water prior to dressing change. Cleanser: Wound Cleanser 1 x Per Week/30 Days Discharge Instructions: Cleanse the wound with wound cleanser prior to applying a clean dressing using gauze sponges, not tissue or cotton balls. Peri-Wound Care: Triamcinolone 15 (g) 1 x Per Week/30 Days Discharge Instructions: Use triamcinolone 15 (g) as directed Peri-Wound Care: Sween Lotion (Moisturizing lotion) 1 x Per Week/30 Days Discharge Instructions: Apply moisturizing lotion as directed Prim Dressing: Hydrofera Blue Ready Foam, 2.5 x2.5 in 1 x Per Week/30 Days ary Discharge Instructions: Apply to wound bed as instructed Prim Dressing: Santyl Ointment 1 x Per Week/30 Days ary Discharge Instructions: Apply nickel thick amount to wound bed as instructed Secondary Dressing: Zetuvit Plus 4x8 in 1 x Per Week/30 Days Discharge Instructions: Apply over primary  dressing as directed. Compression Wrap: ThreePress (3 layer compression wrap) 1 x Per Week/30 Days Discharge Instructions: Apply three layer compression as directed. Electronic Signature(s) Signed: 05/29/2022 4:20:15 PM By: [name] Entered By: Kalman Shan on 05/29/2022 15:56:33 -------------------------------------------------------------------------------- Problem List Details Patient Name: Date of Service: [name], Zachary Fenton RD L. 05/29/2022 2:45 PM Medical Record Number: [id number] Patient Account Number: [id number] Date of Birth/Sex: Treating RN: [birth year] (66 y.o. [name] Primary Care Provider: [name] Other Clinician: Referring Provider: Treating Provider/Extender: [name] in Treatment: 0 Active Problems ICD-10 Encounter Code Description Active Date MDM Diagnosis I87.311 Chronic venous hypertension (idiopathic) with ulcer of right lower extremity 05/24/2022 No Yes L97.812 Non-pressure chronic ulcer of other part of right lower leg with fat layer 05/24/2022 No Yes exposed H85.27 Chronic systolic (congestive) heart failure 05/24/2022 No Yes I10 Essential (primary) hypertension 05/24/2022 No Yes I42.8 Other cardiomyopathies 05/24/2022 No Yes J44.9 Chronic obstructive pulmonary disease, unspecified 05/24/2022 No Yes I48.19 Other persistent atrial fibrillation 05/24/2022 No Yes E11.622 Type 2 diabetes mellitus with other skin ulcer 05/24/2022 No Yes T79.8XXA Other early complications of trauma, initial encounter 05/24/2022 No Yes Inactive Problems Resolved Problems Electronic Signature(s) Signed: 05/29/2022 4:20:15 PM By: [name] Entered By: Kalman Shan on 05/29/2022 15:55:15 -------------------------------------------------------------------------------- Progress Note Details Patient Name: Date of Service: [name] RD L. 05/29/2022 2:45 PM Medical Record Number: [id number] Patient Account Number: [id number] Date  of Birth/Sex: Treating RN: 12-04-1955 (66 y.o. [name] Primary Care Provider: [name] Other Clinician: Referring Provider: Treating Provider/Extender: [name] in Treatment: 0 Subjective Chief Complaint Information obtained from Patient 05/24/2022; right lower extremity wound History of Present Illness (HPI) Admission 5/2 Mr. Schweickert is a 66 year old male with a past medical history of systolic congestive heart failure, COPD, nonischemic cardiomyopathy, type 2 diabetes, chronic venous insufficiency that presents to the clinic for a 5-monthhistory of nonhealing wound to his left lower extremity. He states that in November 2021 he fell off his porch and Hit his leg against a brick. The wound has healed mostly except for 1 area that is still open. He reports moderate Serosanguineous drainage daily. He has been keeping it covered with Band-Aids. He reports 2 rounds of antibiotics for this issue. He denies pain. He denies  current acute signs of infection. 5/9; patient presents for 1 week follow-up. He has been using Hydrofera Blue under 3 layer compression. He has no issues or complaints today. He denies signs of infection. 5/16; patient presents for 1 week follow-up. He has been using Hydrofera Blue under 3 layer compression. He has no issues or complaints today. He denies signs of infection. He states he overall feels well 5/23; patient presents for 1 week follow-up. He has been using Hydrofera Blue under 3 layer compression. He has no issues or complaints today. He denies signs of infection. 6/6; patient presents for 2-week follow-up. He has been using Hydrofera Blue under 3 layer compression. He has had no issues with this. He denies any signs of infection and has no complaints today. Readmissionoodifferent wound 6/27 patient developed another wound to his right lower extremity. He states that 1 week ago he had a large admission for a different  wound Paining that was hung over his couch fell on his leg. He has been using alcohol and keeping the area covered. He denies signs of infection. 7/5; patient presents for 1 week follow-up. He has been tolerating Hydrofera Blue under 3 layer compression. He does report some pain to the wound site. He denies signs of infection. 7/11; patient presents for 1 week follow-up. He has been doing well with Hydrofera Blue under 3 layer compression. He reports improvement in pain since last clinic visit. He has no complaints today. He denies signs of infection. 7/18; patient presents for 1 week follow-up. Has been doing well with Hydrofera Blue under 3 layer compression. He denies signs of infection. Admission 05/24/2022 [name] is a 66 year old male with a past medical history of venous insufficiency that presents to the clinic for a 1 month history of nonhealing wound to his right lower extremity. He states that a wagon wheel hit it and caused a wound. He has been treated with Keflex for this issue By his podiatrist. He has been using silver alginate to the wound bed with dressing changes. He currently denies signs of infection. He is not wearing compression stockings. 8/15; patient presents for follow-up. We have been using antibiotic ointment with Hydrofera Blue under 3 layer compression. He tolerated the compression wrap well. He has no issues or complaints today. He denies signs of infection. Patient History Information obtained from Patient, Chart. Family History Heart Disease - Father, Hypertension - Mother,Father,Siblings, Lung Disease - Mother, Thyroid Problems - Mother, No family history of Cancer, Diabetes, Hereditary Spherocytosis, Kidney Disease, Seizures, Stroke, Tuberculosis. Social History Former smoker - quit 2009, Marital Status - Divorced, Alcohol Use - Never, Drug Use - No History, Caffeine Use - Daily - coffee, diet soda. Medical History Eyes Patient has history of  Cataracts, Glaucoma Denies history of Optic Neuritis Hematologic/Lymphatic Patient has history of Anemia Respiratory Patient has history of Asthma, Chronic Obstructive Pulmonary Disease (COPD), Sleep Apnea - sleeps in recliner Denies history of Pneumothorax Cardiovascular Patient has history of Arrhythmia - afib, Congestive Heart Failure, Deep Vein Thrombosis, Myocardial Infarction, Peripheral Venous Disease Endocrine Patient has history of Type II Diabetes Denies history of Type I Diabetes Genitourinary Denies history of End Stage Renal Disease Integumentary (Skin) Denies history of History of Burn Musculoskeletal Patient has history of Osteoarthritis Oncologic Denies history of Received Chemotherapy, Received Radiation Psychiatric Denies history of Anorexia/bulimia, Confinement Anxiety Medical A Surgical History Notes nd Constitutional Symptoms (General Health) obesity Cardiovascular cardiomyopathy, pacemaker/defibulator Gastrointestinal GERD Endocrine thyrotoxicosis Genitourinary hx kidney stones, BPH Musculoskeletal thoracic  and lumbosacral spondylosis Objective Constitutional respirations regular, non-labored and within target range for patient.. Vitals Time Taken: 2:45 PM, Height: 70 in, Weight: 253 lbs, BMI: 36.3, Temperature: 98.4 F, Pulse: 76 bpm, Respiratory Rate: 18 breaths/min, Blood Pressure: 94/61 mmHg. Cardiovascular 2+ dorsalis pedis/posterior tibialis pulses. Psychiatric pleasant and cooperative. General Notes: Right lower extremity: T the Proximal Medial aspect there is an open wound with granulation tissue and nonviable tissue. Venous stasis o dermatitis Integumentary (Hair, Skin) Wound #3 status is Open. Original cause of wound was Trauma. The date acquired was: 04/15/2022. The wound is located on the Right,Medial Lower Leg. The wound measures 2cm length x 1.7cm width x 0.2cm depth; 2.67cm^2 area and 0.534cm^3 volume. There is Fat Layer  (Subcutaneous Tissue) exposed. There is no tunneling or undermining noted. There is a medium amount of serosanguineous drainage noted. The wound margin is distinct with the outline attached to the wound base. There is medium (34-66%) red granulation within the wound bed. There is a medium (34-66%) amount of necrotic tissue within the wound bed including Adherent Slough. Assessment Active Problems ICD-10 Chronic venous hypertension (idiopathic) with ulcer of right lower extremity Non-pressure chronic ulcer of other part of right lower leg with fat layer exposed Chronic systolic (congestive) heart failure Essential (primary) hypertension Other cardiomyopathies Chronic obstructive pulmonary disease, unspecified Other persistent atrial fibrillation Type 2 diabetes mellitus with other skin ulcer Other early complications of trauma, initial encounter Patient's wound has shown improvement in size and appearance since last clinic visit. I debrided nonviable tissue. At this point I recommended Stopping the antibiotic ointment and adding Santyl with Hydrofera Blue under 3 layer compression. Follow-up in 1 week. Procedures Wound #3 Pre-procedure diagnosis of Wound #3 is a Venous Leg Ulcer located on the Right,Medial Lower Leg .Severity of Tissue Pre Debridement is: Fat layer exposed. There was a Excisional Skin/Subcutaneous Tissue Debridement with a total area of 3.4 sq cm performed by Kalman Shan, DO. With the following instrument(s): Curette to remove Non-Viable tissue/material. Material removed includes Subcutaneous Tissue and Slough and after achieving pain control using Lidocaine 4% T opical Solution. No specimens were taken. A time out was conducted at 15:22, prior to the start of the procedure. A Minimum amount of bleeding was controlled with Pressure. The procedure was tolerated well. Post Debridement Measurements: 2cm length x 1.7cm width x 0.2cm depth; 0.534cm^3 volume. Character of  Wound/Ulcer Post Debridement is stable. Severity of Tissue Post Debridement is: Fat layer exposed. Post procedure Diagnosis Wound #3: Same as Pre-Procedure Pre-procedure diagnosis of Wound #3 is a Venous Leg Ulcer located on the Right,Medial Lower Leg . There was a Three Layer Compression Therapy Procedure by [name], RN. Post procedure Diagnosis Wound #3: Same as Pre-Procedure Plan Follow-up Appointments: Return Appointment in 1 week. - 06/05/22 @ 2:45pm Anesthetic: (In clinic) Topical Lidocaine 5% applied to wound bed (In clinic) Topical Lidocaine 4% applied to wound bed Bathing/ Shower/ Hygiene: May shower with protection but do not get wound dressing(s) wet. - May shower using cast protector bag. Edema Control - Lymphedema / SCD / Other: Elevate legs to the level of the heart or above for 30 minutes daily and/or when sitting, a frequency of: - several times a day Avoid standing for long periods of time. Additional Orders / Instructions: Follow Nutritious Diet WOUND #3: - Lower Leg Wound Laterality: Right, Medial Cleanser: Soap and Water 1 x Per Week/30 Days Discharge Instructions: May shower and wash wound with dial antibacterial soap and water prior to dressing  change. Cleanser: Wound Cleanser 1 x Per Week/30 Days Discharge Instructions: Cleanse the wound with wound cleanser prior to applying a clean dressing using gauze sponges, not tissue or cotton balls. Peri-Wound Care: Triamcinolone 15 (g) 1 x Per Week/30 Days Discharge Instructions: Use triamcinolone 15 (g) as directed Peri-Wound Care: Sween Lotion (Moisturizing lotion) 1 x Per Week/30 Days Discharge Instructions: Apply moisturizing lotion as directed Prim Dressing: Hydrofera Blue Ready Foam, 2.5 x2.5 in 1 x Per Week/30 Days ary Discharge Instructions: Apply to wound bed as instructed Prim Dressing: Santyl Ointment 1 x Per Week/30 Days ary Discharge Instructions: Apply nickel thick amount to wound bed as  instructed Secondary Dressing: Zetuvit Plus 4x8 in 1 x Per Week/30 Days Discharge Instructions: Apply over primary dressing as directed. Com pression Wrap: ThreePress (3 layer compression wrap) 1 x Per Week/30 Days Discharge Instructions: Apply three layer compression as directed. 1. In office sharp debridement 2. Santyl with Hydrofera Blue under 3 layer compression 3. Follow-up in 1 week Electronic Signature(s) Signed: 05/29/2022 4:20:15 PM By: [name] Entered By: Kalman Shan on 05/29/2022 15:57:24 -------------------------------------------------------------------------------- HxROS Details Patient Name: Date of Service: [name], Zachary Fenton RD L. 05/29/2022 2:45 PM Medical Record Number: [id number] Patient Account Number: [id number] Date of Birth/Sex: Treating RN: January 18, 1956 (66 y.o. [name] Primary Care Provider: [name] Other Clinician: Referring Provider: Treating Provider/Extender: [name] in Treatment: 0 Information Obtained From Patient Chart Constitutional Symptoms (General Health) Medical History: Past Medical History Notes: obesity Eyes Medical History: Positive for: Cataracts; Glaucoma Negative for: Optic Neuritis Hematologic/Lymphatic Medical History: Positive for: Anemia Respiratory Medical History: Positive for: Asthma; Chronic Obstructive Pulmonary Disease (COPD); Sleep Apnea - sleeps in recliner Negative for: Pneumothorax Cardiovascular Medical History: Positive for: Arrhythmia - afib; Congestive Heart Failure; Deep Vein Thrombosis; Myocardial Infarction; Peripheral Venous Disease Past Medical History Notes: cardiomyopathy, pacemaker/defibulator Gastrointestinal Medical History: Past Medical History Notes: GERD Endocrine Medical History: Positive for: Type II Diabetes Negative for: Type I Diabetes Past Medical History Notes: thyrotoxicosis Time with diabetes: 10 yrs Treated with: Diet Blood  sugar tested every day: No Genitourinary Medical History: Negative for: End Stage Renal Disease Past Medical History Notes: hx kidney stones, BPH Integumentary (Skin) Medical History: Negative for: History of Burn Musculoskeletal Medical History: Positive for: Osteoarthritis Past Medical History Notes: thoracic and lumbosacral spondylosis Oncologic Medical History: Negative for: Received Chemotherapy; Received Radiation Psychiatric Medical History: Negative for: Anorexia/bulimia; Confinement Anxiety HBO Extended History Items Eyes: Eyes: Cataracts Glaucoma Immunizations Pneumococcal Vaccine: Received Pneumococcal Vaccination: Yes Received Pneumococcal Vaccination On or After 60th Birthday: Yes Implantable Devices Yes Family and Social History Cancer: No; Diabetes: No; Heart Disease: Yes - Father; Hereditary Spherocytosis: No; Hypertension: Yes - Mother,Father,Siblings; Kidney Disease: No; Lung Disease: Yes - Mother; Seizures: No; Stroke: No; Thyroid Problems: Yes - Mother; Tuberculosis: No; Former smoker - quit 2009; Marital Status - Divorced; Alcohol Use: Never; Drug Use: No History; Caffeine Use: Daily - coffee, diet soda; Financial Concerns: No; Food, Clothing or Shelter Needs: No; Support System Lacking: No; Transportation Concerns: No Electronic Signature(s) Signed: 05/29/2022 4:20:15 PM By: [name] Signed: 05/29/2022 5:00:27 PM By: [name] Entered By: Kalman Shan on 05/29/2022 15:56:08 -------------------------------------------------------------------------------- SuperBill Details Patient Name: Date of Service: [name], Zachary Fenton RD L. 05/29/2022 Medical Record Number: [id number] Patient Account Number: [id number] Date of Birth/Sex: Treating RN: [birth year] (66 y.o. [name] Primary Care Provider: [name] Other Clinician: Referring Provider: Treating Provider/Extender: [name] in Treatment: 0 Diagnosis  Coding ICD-10 Codes Code Description I87.311 Chronic venous hypertension (idiopathic) with ulcer of right lower extremity L97.812 Non-pressure chronic ulcer of other part of right lower leg with fat layer exposed I62.70 Chronic systolic (congestive) heart failure I10 Essential (primary) hypertension I42.8 Other cardiomyopathies J44.9 Chronic obstructive pulmonary disease, unspecified I48.19 Other persistent atrial fibrillation E11.622 Type 2 diabetes mellitus with other skin ulcer T79.8XXA Other early complications of trauma, initial encounter Facility Procedures CPT4 Code: 35009381 Description: 82993 - DEB SUBQ TISSUE 20 SQ CM/< ICD-10 Diagnosis Description Z16.967 Non-pressure chronic ulcer of other part of right lower leg with fat layer exp Modifier: osed Quantity: 1 Physician Procedures : CPT4 Code Description Modifier [id number] - WC PHYS SUBQ TISS 20 SQ CM ICD-10 Diagnosis Description H85.277 Non-pressure chronic ulcer of other part of right lower leg with fat layer exposed Quantity: 1 Electronic Signature(s) Signed: 05/29/2022 4:20:15 PM By: [name] Entered By: Kalman Shan on 05/29/2022 15:57:32

## 2022-05-30 NOTE — Progress Notes (Signed)
[name], [name] ([id number]) Visit Report for 05/29/2022 Arrival Information Details Patient Name: Date of Service: [name] RD L. 05/29/2022 2:45 PM Medical Record Number: [id number] Patient Account Number: [id number] Date of Birth/Sex: Treating RN: Feb 18, 1956 (66 y.o. [name] Primary Care Field [name]: [name] Other Clinician: Referring [name]: Treating [name]/Extender: [name] in Treatment: 0 Visit Information History Since Last Visit Added or deleted any medications: No Patient Arrived: [name] Any new allergies or adverse reactions: No Arrival Time: 14:43 Had a fall or experienced change in No Transfer Assistance: None activities of daily living that may affect Patient Identification Verified: Yes risk of falls: Secondary Verification Process Completed: Yes Signs or symptoms of abuse/neglect since last visito No Patient Requires Transmission-Based Precautions: No Hospitalized since last visit: No Patient Has Alerts: Yes Implantable device outside of the clinic excluding No Patient Alerts: Patient on Blood Thinner cellular tissue based products placed in the center Pacemaker/Defibrillator since last visit: Has Dressing in Place as Prescribed: Yes Has Compression in Place as Prescribed: Yes Pain Present Now: No Electronic Signature(s) Signed: 05/29/2022 5:00:27 PM By: [name] Entered By: [name] on 05/29/2022 14:45:41 -------------------------------------------------------------------------------- Compression Therapy Details Patient Name: Date of Service: [name] RD L. 05/29/2022 2:45 PM Medical Record Number: [id number] Patient Account Number: [id number] Date of Birth/Sex: Treating RN: 1956-09-02 (66 y.o. [name] Primary Care Kyleena Scheirer: [name] Other Clinician: Referring Angeliz Settlemyre: Treating Leara Rawl/Extender: [name] in Treatment: 0 Compression Therapy Performed for Wound  Assessment: Wound #3 Right,Medial Lower Leg Performed By: Clinician [name], RN Compression Type: Three Layer Post Procedure Diagnosis Same as Pre-procedure Electronic Signature(s) Signed: 05/29/2022 5:00:27 PM By: [name] Entered By: [name] on 05/29/2022 15:29:06 -------------------------------------------------------------------------------- Encounter Discharge Information Details Patient Name: Date of Service: [name], [name] RD L. 05/29/2022 2:45 PM Medical Record Number: [id number] Patient Account Number: [id number] Date of Birth/Sex: Treating RN: 10-03-1956 (66 y.o. [name] Primary Care Kaelyn Innocent: [name] Other Clinician: Referring Bliss Behnke: Treating Urian Martenson/Extender: [name] in Treatment: 0 Encounter Discharge Information Items Post Procedure Vitals Discharge Condition: Stable Temperature (F): 98.4 Ambulatory Status: Cane Pulse (bpm): 76 Discharge Destination: Home Respiratory Rate (breaths/min): 18 Transportation: Private Auto Blood Pressure (mmHg): 94/61 Schedule Follow-up Appointment: Yes Clinical Summary of Care: Provided on 05/29/2022 Form Type Recipient Paper Patient Patient Electronic Signature(s) Signed: 05/29/2022 4:05:38 PM By: [name] Entered By: [name] on 05/29/2022 16:05:38 -------------------------------------------------------------------------------- Lower Extremity Assessment Details Patient Name: Date of Service: [name] RD L. 05/29/2022 2:45 PM Medical Record Number: [id number] Patient Account Number: [id number] Date of Birth/Sex: Treating RN: 1956/02/13 (66 y.o. [name] Primary Care Betzaida Cremeens: [name] Other Clinician: Referring Tvisha Schwoerer: Treating Garritt Molyneux/Extender: [name] in Treatment: 0 Edema Assessment Assessed: [name]: No] [name]: Yes] Edema: [Left: Ye] [Right: s] Calf Left: Right: Point of Measurement: 36 cm From Medial  Instep 35 cm Ankle Left: Right: Point of Measurement: 12 cm From Medial Instep 20.2 cm Vascular Assessment Pulses: Dorsalis Pedis Palpable: [Right:Yes] Electronic Signature(s) Signed: 05/29/2022 5:00:27 PM By: [name] Entered By: [name] on 05/29/2022 14:52:51 -------------------------------------------------------------------------------- Multi Wound Chart Details Patient Name: Date of Service: [name] RD L. 05/29/2022 2:45 PM Medical Record Number: [id number] Patient Account Number: [id number] Date of Birth/Sex: Treating RN: Sep 13, 1956 (66 y.o. [name] Primary Care Eleora Sutherland: [name] Other Clinician: Referring Tighe Gitto: Treating Kelissa Merlin/Extender: [name] in Treatment: 0 Vital Signs Height(in): 70 Pulse(bpm):  73 Weight(lbs): 253 Blood Pressure(mmHg): 94/61 Body Mass Index(BMI): 36.3 Temperature(F): 98.4 Respiratory Rate(breaths/min): 18 Photos: [[name]:[name]] Right, Medial Lower Leg [name] [name] Wound Location: Trauma [name] [name] Wounding Event: Venous Leg Ulcer [name] [name] Primary Etiology: Cataracts, Glaucoma, Anemia, [name] [name] Comorbid History: Asthma, Chronic Obstructive Pulmonary Disease (COPD), Sleep Apnea, Arrhythmia, Congestive Heart Failure, Deep Vein Thrombosis, Myocardial Infarction, Peripheral Venous Disease, Type II Diabetes, Osteoarthritis 04/15/2022 [name] [name] Date Acquired: 0 [name] [name] Weeks of Treatment: Open [name] [name] Wound Status: No [name] [name] Wound Recurrence: 2x1.7x0.2 [name] [name] Measurements L x W x D (cm) 2.67 [name] [name] A (cm) : rea 0.534 [name] [name] Volume (cm) : 10.60% [name] [name] % Reduction in A rea: 10.60% [name] [name] % Reduction in Volume: Full Thickness Without Exposed [name] [name] Classification: Support Structures Medium [name] [name] Exudate A mount: Serosanguineous [name] [name] Exudate Type: red, brown [name] [name] Exudate Color: Distinct, outline attached [name] [name] Wound Margin: Medium (34-66%) [name]  [name] Granulation A mount: Red [name] [name] Granulation Quality: Medium (34-66%) [name] [name] Necrotic A mount: Fat Layer (Subcutaneous Tissue): Yes [name] [name] Exposed Structures: Fascia: No Tendon: No Muscle: No Joint: No Bone: No Small (1-33%) [name] [name] Epithelialization: Debridement - Excisional [name] [name] Debridement: Pre-procedure Verification/Time Out 15:22 [name] [name] Taken: Lidocaine 4% Topical Solution [name] [name] Pain Control: Subcutaneous, Slough [name] [name] Tissue Debrided: Skin/Subcutaneous Tissue [name] [name] Level: 3.4 [name] [name] Debridement A (sq cm): rea Curette [name] [name] Instrument: Minimum [name] [name] Bleeding: Pressure [name] [name] Hemostasis A chieved: Procedure was tolerated well [name] [name] Debridement Treatment Response: 2x1.7x0.2 [name] [name] Post Debridement Measurements L x W x D (cm) 0.534 [name] [name] Post Debridement Volume: (cm) Compression Therapy [name] [name] Procedures Performed: Debridement Treatment Notes Electronic Signature(s) Signed: 05/29/2022 4:20:15 PM By: [name] Signed: 05/29/2022 5:00:27 PM By: [name] Entered By: Kalman Shan on 05/29/2022 15:55:20 -------------------------------------------------------------------------------- Multi-Disciplinary Care Plan Details Patient Name: Date of Service: [name] RD L. 05/29/2022 2:45 PM Medical Record Number: [id number] Patient Account Number: [id number] Date of Birth/Sex: Treating RN: [birth year] (66 y.o. [name] Primary Care Lajarvis Italiano: [name] Other Clinician: Referring Christianjames Soule: Treating Trung Wenzl/Extender: [name] in Treatment: 0 Active Inactive Wound/Skin Impairment Nursing Diagnoses: Impaired tissue integrity Goals: Patient/caregiver will verbalize understanding of skin care regimen Date Initiated: 05/24/2022 Target Resolution Date: 06/21/2022 Goal Status: Active Ulcer/skin breakdown will have a volume reduction of 30% by week 4 Date Initiated: 05/24/2022 Target  Resolution Date: 06/21/2022 Goal Status: Active Interventions: Assess patient/caregiver ability to obtain necessary supplies Assess patient/caregiver ability to perform ulcer/skin care regimen upon admission and as needed Provide education on ulcer and skin care Treatment Activities: Topical wound management initiated : 05/24/2022 Notes: Electronic Signature(s) Signed: 05/29/2022 5:00:27 PM By: [name] Entered By: [name] on 05/29/2022 14:43:27 -------------------------------------------------------------------------------- Pain Assessment Details Patient Name: Date of Service: [name] RD L. 05/29/2022 2:45 PM Medical Record Number: [id number] Patient Account Number: [id number] Date of Birth/Sex: Treating RN: [birth year] (66 y.o. [name] Primary Care Aryaman Haliburton: [name] Other Clinician: Referring Mesiah Manzo: Treating Demetreus Lothamer/Extender: [name] in Treatment: 0 Active Problems Location of Pain Severity and Description of Pain Patient Has Paino No Site Locations Pain Management and Medication Current Pain Management: Electronic Signature(s) Signed: 05/29/2022 5:00:27 PM By: [name] Entered By: [name] on 05/29/2022 14:47:54 -------------------------------------------------------------------------------- Patient/Caregiver Education Details Patient Name: Date of Service: [name] RD L. 8/15/2023andnbsp2:45 PM Medical Record Number: [id number] Patient Account Number: [id number] Date of Birth/Gender:  Treating RN: 1955/10/21 (66 y.o. [name] Primary Care Physician: [name] Other Clinician: Referring Physician: Treating Physician/Extender: [name] in Treatment: 0 Education Assessment Education Provided To: Patient Education Topics Provided Venous: Methods: Explain/Verbal, Printed Responses: State content correctly Wound/Skin Impairment: Methods: Explain/Verbal,  Printed Responses: State content correctly Electronic Signature(s) Signed: 05/29/2022 5:00:27 PM By: [name] Entered By: [name] on 05/29/2022 14:43:48 -------------------------------------------------------------------------------- Wound Assessment Details Patient Name: Date of Service: [name] RD L. 05/29/2022 2:45 PM Medical Record Number: [id number] Patient Account Number: [id number] Date of Birth/Sex: Treating RN: September 18, 1956 (66 y.o. [name] Primary Care Santa Abdelrahman: [name] Other Clinician: Referring Jaydyn Menon: Treating Aidan Caloca/Extender: [name] in Treatment: 0 Wound Status Wound Number: 3 Primary Venous Leg Ulcer Etiology: Wound Location: Right, Medial Lower Leg Wound Open Wounding Event: Trauma Status: Date Acquired: 04/15/2022 Comorbid Cataracts, Glaucoma, Anemia, Asthma, Chronic Obstructive Weeks Of Treatment: 0 History: Pulmonary Disease (COPD), Sleep Apnea, Arrhythmia, Congestive Clustered Wound: No Heart Failure, Deep Vein Thrombosis, Myocardial Infarction, Peripheral Venous Disease, Type II Diabetes, Osteoarthritis Photos Wound Measurements Length: (cm) 2 Width: (cm) 1.7 Depth: (cm) 0.2 Area: (cm) 2.67 Volume: (cm) 0.534 % Reduction in Area: 10.6% % Reduction in Volume: 10.6% Epithelialization: Small (1-33%) Tunneling: No Undermining: No Wound Description Classification: Full Thickness Without Exposed Support Structures Wound Margin: Distinct, outline attached Exudate Amount: Medium Exudate Type: Serosanguineous Exudate Color: red, brown Foul Odor After Cleansing: No Slough/Fibrino Yes Wound Bed Granulation Amount: Medium (34-66%) Exposed Structure Granulation Quality: Red Fascia Exposed: No Necrotic Amount: Medium (34-66%) Fat Layer (Subcutaneous Tissue) Exposed: Yes Necrotic Quality: Adherent Slough Tendon Exposed: No Muscle Exposed: No Joint Exposed: No Bone Exposed: No Treatment  Notes Wound #3 (Lower Leg) Wound Laterality: Right, Medial Cleanser Soap and Water Discharge Instruction: May shower and wash wound with dial antibacterial soap and water prior to dressing change. Wound Cleanser Discharge Instruction: Cleanse the wound with wound cleanser prior to applying a clean dressing using gauze sponges, not tissue or cotton balls. Peri-Wound Care Triamcinolone 15 (g) Discharge Instruction: Use triamcinolone 15 (g) as directed Sween Lotion (Moisturizing lotion) Discharge Instruction: Apply moisturizing lotion as directed Topical Primary Dressing Hydrofera Blue Ready Foam, 2.5 x2.5 in Discharge Instruction: Apply to wound bed as instructed Santyl Ointment Discharge Instruction: Apply nickel thick amount to wound bed as instructed Secondary Dressing Zetuvit Plus 4x8 in Discharge Instruction: Apply over primary dressing as directed. Secured With Compression Wrap ThreePress (3 layer compression wrap) Discharge Instruction: Apply three layer compression as directed. Compression Stockings Add-Ons Electronic Signature(s) Signed: 05/29/2022 4:22:40 PM By: Deon Pilling RN, BSN Signed: 05/29/2022 5:00:27 PM By: [name] Entered By: Deon Pilling on 05/29/2022 14:59:05 -------------------------------------------------------------------------------- Vitals Details Patient Name: Date of Service: [name], [name] RD L. 05/29/2022 2:45 PM Medical Record Number: [id number] Patient Account Number: [id number] Date of Birth/Sex: Treating RN: 10/21/1955 (66 y.o. [name] Primary Care Taylan Mayhan: [name] Other Clinician: Referring Mohogany Toppins: Treating Berenize Gatlin/Extender: [name] in Treatment: 0 Vital Signs Time Taken: 14:45 Temperature (F): 98.4 Height (in): 70 Pulse (bpm): 76 Weight (lbs): 253 Respiratory Rate (breaths/min): 18 Body Mass Index (BMI): 36.3 Blood Pressure (mmHg): 94/61 Reference Range: 80 - 120 mg /  dl Electronic Signature(s) Signed: 05/29/2022 5:00:27 PM By: [name] Entered By: [name] on 05/29/2022 14:47:43

## 2022-06-05 ENCOUNTER — Encounter (HOSPITAL_BASED_OUTPATIENT_CLINIC_OR_DEPARTMENT_OTHER): Payer: Medicare HMO | Admitting: Internal Medicine

## 2022-06-05 DIAGNOSIS — T798XXA Other early complications of trauma, initial encounter: Secondary | ICD-10-CM

## 2022-06-05 DIAGNOSIS — I872 Venous insufficiency (chronic) (peripheral): Secondary | ICD-10-CM | POA: Diagnosis not present

## 2022-06-05 DIAGNOSIS — I4819 Other persistent atrial fibrillation: Secondary | ICD-10-CM | POA: Diagnosis not present

## 2022-06-05 DIAGNOSIS — E11622 Type 2 diabetes mellitus with other skin ulcer: Secondary | ICD-10-CM | POA: Diagnosis not present

## 2022-06-05 DIAGNOSIS — I428 Other cardiomyopathies: Secondary | ICD-10-CM | POA: Diagnosis not present

## 2022-06-05 DIAGNOSIS — L97812 Non-pressure chronic ulcer of other part of right lower leg with fat layer exposed: Secondary | ICD-10-CM

## 2022-06-05 DIAGNOSIS — I87311 Chronic venous hypertension (idiopathic) with ulcer of right lower extremity: Secondary | ICD-10-CM | POA: Diagnosis not present

## 2022-06-05 DIAGNOSIS — I5022 Chronic systolic (congestive) heart failure: Secondary | ICD-10-CM | POA: Diagnosis not present

## 2022-06-05 DIAGNOSIS — I110 Hypertensive heart disease with heart failure: Secondary | ICD-10-CM | POA: Diagnosis not present

## 2022-06-05 DIAGNOSIS — J449 Chronic obstructive pulmonary disease, unspecified: Secondary | ICD-10-CM | POA: Diagnosis not present

## 2022-06-05 NOTE — Progress Notes (Signed)
Zachary, [name] ([id number]) Visit Report for 06/05/2022 Arrival Information Details Patient Name: Date of Service: [name] RD L. 06/05/2022 2:45 PM Medical Record Number: [id number] Patient Account Number: [id number] Date of Birth/Sex: Treating RN: [birth year] (66 y.o. [name] Primary Care Ryaan Vanwagoner: [name] Other Clinician: Referring Masashi Snowdon: Treating Hadasah Brugger/Extender: [name] in Treatment: 1 Visit Information History Since Last Visit Added or deleted any medications: No Patient Arrived: [name] Any new allergies or adverse reactions: No Arrival Time: 14:41 Had a fall or experienced change in No Accompanied By: self activities of daily living that may affect Transfer Assistance: None risk of falls: Patient Identification Verified: Yes Signs or symptoms of abuse/neglect since last visito No Secondary Verification Process Completed: Yes Hospitalized since last visit: No Patient Requires Transmission-Based Precautions: No Implantable device outside of the clinic excluding No Patient Has Alerts: Yes cellular tissue based products placed in the center Patient Alerts: Patient on Blood Thinner since last visit: Pacemaker/Defibrillator Has Compression in Place as Prescribed: Yes Pain Present Now: No Electronic Signature(s) Signed: 06/05/2022 4:08:49 PM By: Erenest Blank Entered By: Erenest Blank on 06/05/2022 14:41:48 -------------------------------------------------------------------------------- Compression Therapy Details Patient Name: Date of Service: [name] RD L. 06/05/2022 2:45 PM Medical Record Number: [id number] Patient Account Number: [id number] Date of Birth/Sex: Treating RN: 06/05/1956 (66 y.o. [name] Primary Care Pascale Maves: [name] Other Clinician: Referring Rozell Theiler: Treating Amariyah Bazar/Extender: [name] in Treatment: 1 Compression Therapy Performed for Wound Assessment: Wound #3  Right,Medial Lower Leg Performed By: Clinician [name], RN Compression Type: Three Layer Post Procedure Diagnosis Same as Pre-procedure Electronic Signature(s) Signed: 06/05/2022 3:57:26 PM By: [name] Entered By: [name] on 06/05/2022 15:05:24 -------------------------------------------------------------------------------- Encounter Discharge Information Details Patient Name: Date of Service: [name], [name] RD L. 06/05/2022 2:45 PM Medical Record Number: [id number] Patient Account Number: [id number] Date of Birth/Sex: Treating RN: June 13, 1956 (66 y.o. [name] Primary Care Fatimah Sundquist: [name] Other Clinician: Referring Shamir Tuzzolino: Treating Arya Luttrull/Extender: [name] in Treatment: 1 Encounter Discharge Information Items Discharge Condition: Stable Ambulatory Status: [place] Discharge Destination: Home Transportation: Private Auto Schedule Follow-up Appointment: Yes Clinical Summary of Care: Provided on 06/05/2022 Form Type Recipient Paper Patient Patient Electronic Signature(s) Signed: 06/05/2022 3:57:26 PM By: [name] Entered By: [name] on 06/05/2022 15:22:24 -------------------------------------------------------------------------------- Lower Extremity Assessment Details Patient Name: Date of Service: [name] RD L. 06/05/2022 2:45 PM Medical Record Number: [id number] Patient Account Number: [id number] Date of Birth/Sex: Treating RN: [birth year] (66 y.o. [name] Primary Care Daivd Fredericksen: [name] Other Clinician: Referring Judianne Seiple: Treating Virgilia Quigg/Extender: [name] in Treatment: 1 Edema Assessment Assessed: [name]: No] [name]: Yes] Edema: [Left: Ye] [Right: s] Calf Left: Right: Point of Measurement: 36 cm From Medial Instep 35.5 cm Ankle Left: Right: Point of Measurement: 12 cm From Medial Instep 20 cm Vascular Assessment Pulses: Dorsalis Pedis Palpable:  [Right:Yes] Electronic Signature(s) Signed: 06/05/2022 3:57:26 PM By: [name] Entered By: [name] on 06/05/2022 14:51:31 -------------------------------------------------------------------------------- Multi Wound Chart Details Patient Name: Date of Service: [name] RD L. 06/05/2022 2:45 PM Medical Record Number: [id number] Patient Account Number: [id number] Date of Birth/Sex: Treating RN: [birth year] (66 y.o. [name] Primary Care Dody Smartt: [name] Other Clinician: Referring Lashara Urey: Treating Damione Robideau/Extender: [name] in Treatment: 1 Vital Signs Height(in): 70 Pulse(bpm): 52 Weight(lbs): 253 Blood Pressure(mmHg): 115/74 Body Mass Index(BMI): 36.3 Temperature(F): 98. Respiratory Rate(breaths/min): 18 Photos: [N/A:N/A] Right, Medial Lower Leg  N/A N/A Wound Location: Trauma N/A N/A Wounding Event: Venous Leg Ulcer N/A N/A Primary Etiology: Cataracts, Glaucoma, Anemia, N/A N/A Comorbid History: Asthma, Chronic Obstructive Pulmonary Disease (COPD), Sleep Apnea, Arrhythmia, Congestive Heart Failure, Deep Vein Thrombosis, Myocardial Infarction, Peripheral Venous Disease, Type II Diabetes, Osteoarthritis 04/15/2022 N/A N/A Date Acquired: 1 N/A N/A Weeks of Treatment: Open N/A N/A Wound Status: No N/A N/A Wound Recurrence: 1.4x1.2x0.2 N/A N/A Measurements L x W x D (cm) 1.319 N/A N/A A (cm) : rea 0.264 N/A N/A Volume (cm) : 55.80% N/A N/A % Reduction in Area: 55.80% N/A N/A % Reduction in Volume: Full Thickness Without Exposed N/A N/A Classification: Support Structures Medium N/A N/A Exudate Amount: Serosanguineous N/A N/A Exudate Type: red, brown N/A N/A Exudate Color: Distinct, outline attached N/A N/A Wound Margin: Medium (34-66%) N/A N/A Granulation Amount: Red N/A N/A Granulation Quality: Medium (34-66%) N/A N/A Necrotic Amount: Fat Layer (Subcutaneous Tissue): Yes N/A N/A Exposed  Structures: Fascia: No Tendon: No Muscle: No Joint: No Bone: No Small (1-33%) N/A N/A Epithelialization: Compression Therapy N/A N/A Procedures Performed: Treatment Notes Wound #3 (Lower Leg) Wound Laterality: Right, Medial Cleanser Soap and Water Discharge Instruction: May shower and wash wound with dial antibacterial soap and water prior to dressing change. Wound Cleanser Discharge Instruction: Cleanse the wound with wound cleanser prior to applying a clean dressing using gauze sponges, not tissue or cotton balls. Peri-Wound Care Triamcinolone 15 (g) Discharge Instruction: Use triamcinolone 15 (g) as directed Sween Lotion (Moisturizing lotion) Discharge Instruction: Apply moisturizing lotion as directed Topical Primary Dressing Promogran Prisma Matrix, 4.34 (sq in) (silver collagen) Discharge Instruction: Moisten collagen with saline or hydrogel Secondary Dressing ABD Pad, 5x9 Discharge Instruction: Apply over primary dressing as directed. Secured With Compression Wrap ThreePress (3 layer compression wrap) Discharge Instruction: Apply three layer compression as directed. Compression Stockings Add-Ons Electronic Signature(s) Signed: 06/05/2022 3:51:23 PM By: [name] Signed: 06/05/2022 3:57:26 PM By: Fara Chute By: Kalman Shan on 06/05/2022 15:36:55 -------------------------------------------------------------------------------- Multi-Disciplinary Care Plan Details Patient Name: Date of Service: [name], [name] RD L. 06/05/2022 2:45 PM Medical Record Number: [id number] Patient Account Number: [id number] Date of Birth/Sex: Treating RN: [birth year] (66 y.o. [name] Primary Care Alayasia Breeding: [name] Other Clinician: Referring Aylana Hirschfeld: Treating Tahirih Lair/Extender: [name] in Treatment: 1 Active Inactive Wound/Skin Impairment Nursing Diagnoses: Impaired tissue integrity Goals: Patient/caregiver will  verbalize understanding of skin care regimen Date Initiated: 05/24/2022 Target Resolution Date: 06/21/2022 Goal Status: Active Ulcer/skin breakdown will have a volume reduction of 30% by week 4 Date Initiated: 05/24/2022 Target Resolution Date: 06/21/2022 Goal Status: Active Interventions: Assess patient/caregiver ability to obtain necessary supplies Assess patient/caregiver ability to perform ulcer/skin care regimen upon admission and as needed Provide education on ulcer and skin care Treatment Activities: Topical wound management initiated : 05/24/2022 Notes: Electronic Signature(s) Signed: 06/05/2022 3:57:26 PM By: [name] Entered By: [name] on 06/05/2022 14:51:46 -------------------------------------------------------------------------------- Pain Assessment Details Patient Name: Date of Service: [name] RD L. 06/05/2022 2:45 PM Medical Record Number: [id number] Patient Account Number: [id number] Date of Birth/Sex: Treating RN: May 09, 1956 (66 y.o. [name] Primary Care Sheppard Luckenbach: [name] Other Clinician: Referring Maylea Soria: Treating Masaye Gatchalian/Extender: [name] in Treatment: 1 Active Problems Location of Pain Severity and Description of Pain Patient Has Paino No Site Locations Pain Management and Medication Current Pain Management: Electronic Signature(s) Signed: 06/05/2022 3:57:26 PM By: [name] Signed: 06/05/2022 4:08:49 PM By: Erenest Blank Entered By: Erenest Blank on 06/05/2022  14:43:58 -------------------------------------------------------------------------------- Patient/Caregiver Education Details Patient Name: Date of Service: [name] RD Carlean Jews 8/22/2023andnbsp2:45 PM Medical Record Number: [id number] Patient Account Number: [id number] Date of Birth/Gender: Treating RN: [birth year] (67 y.o. [name] Primary Care Physician: [name] Other Clinician: Referring Physician: Treating  Physician/Extender: [name] in Treatment: 1 Education Assessment Education Provided To: Patient Education Topics Provided Venous: Methods: Explain/Verbal, Printed Responses: State content correctly Wound/Skin Impairment: Methods: Demonstration, Explain/Verbal, Printed Responses: State content correctly Electronic Signature(s) Signed: 06/05/2022 3:57:26 PM By: [name] Entered By: [name] on 06/05/2022 14:52:14 -------------------------------------------------------------------------------- Wound Assessment Details Patient Name: Date of Service: [name] RD L. 06/05/2022 2:45 PM Medical Record Number: [id number] Patient Account Number: [id number] Date of Birth/Sex: Treating RN: 1956/06/13 (66 y.o. [name] Primary Care Shonta Phillis: [name] Other Clinician: Referring Burkley Dech: Treating Ayanna Gheen/Extender: [name] in Treatment: 1 Wound Status Wound Number: 3 Primary Venous Leg Ulcer Etiology: Wound Location: Right, Medial Lower Leg Wound Open Wounding Event: Trauma Status: Date Acquired: 04/15/2022 Comorbid Cataracts, Glaucoma, Anemia, Asthma, Chronic Obstructive Weeks Of Treatment: 1 History: Pulmonary Disease (COPD), Sleep Apnea, Arrhythmia, Congestive Clustered Wound: No Heart Failure, Deep Vein Thrombosis, Myocardial Infarction, Peripheral Venous Disease, Type II Diabetes, Osteoarthritis Photos Wound Measurements Length: (cm) 1.4 Width: (cm) 1.2 Depth: (cm) 0.2 Area: (cm) 1.319 Volume: (cm) 0.264 % Reduction in Area: 55.8% % Reduction in Volume: 55.8% Epithelialization: Small (1-33%) Tunneling: No Undermining: No Wound Description Classification: Full Thickness Without Exposed Support Structures Wound Margin: Distinct, outline attached Exudate Amount: Medium Exudate Type: Serosanguineous Exudate Color: red, brown Foul Odor After Cleansing: No Slough/Fibrino Yes Wound  Bed Granulation Amount: Medium (34-66%) Exposed Structure Granulation Quality: Red Fascia Exposed: No Necrotic Amount: Medium (34-66%) Fat Layer (Subcutaneous Tissue) Exposed: Yes Necrotic Quality: Adherent Slough Tendon Exposed: No Muscle Exposed: No Joint Exposed: No Bone Exposed: No Treatment Notes Wound #3 (Lower Leg) Wound Laterality: Right, Medial Cleanser Soap and Water Discharge Instruction: May shower and wash wound with dial antibacterial soap and water prior to dressing change. Wound Cleanser Discharge Instruction: Cleanse the wound with wound cleanser prior to applying a clean dressing using gauze sponges, not tissue or cotton balls. Peri-Wound Care Triamcinolone 15 (g) Discharge Instruction: Use triamcinolone 15 (g) as directed Sween Lotion (Moisturizing lotion) Discharge Instruction: Apply moisturizing lotion as directed Topical Primary Dressing Promogran Prisma Matrix, 4.34 (sq in) (silver collagen) Discharge Instruction: Moisten collagen with saline or hydrogel Secondary Dressing ABD Pad, 5x9 Discharge Instruction: Apply over primary dressing as directed. Secured With Compression Wrap ThreePress (3 layer compression wrap) Discharge Instruction: Apply three layer compression as directed. Compression Stockings Add-Ons Electronic Signature(s) Signed: 06/05/2022 3:57:26 PM By: [name] Entered By: [name] on 06/05/2022 14:55:05 -------------------------------------------------------------------------------- Vitals Details Patient Name: Date of Service: [name], [name] RD L. 06/05/2022 2:45 PM Medical Record Number: [id number] Patient Account Number: [id number] Date of Birth/Sex: Treating RN: 1956/07/06 (67 y.o. [name] Primary Care Kenniel Bergsma: [name] Other Clinician: Referring Olis Viverette: Treating Kaiya Boatman/Extender: [name] in Treatment: 1 Vital Signs Time Taken: 14:41 Temperature (F): 98. Height (in):  70 Pulse (bpm): 77 Weight (lbs): 253 Respiratory Rate (breaths/min): 18 Body Mass Index (BMI): 36.3 Blood Pressure (mmHg): 115/74 Reference Range: 80 - 120 mg / dl Electronic Signature(s) Signed: 06/05/2022 4:08:49 PM By: Erenest Blank Entered By: Erenest Blank on 06/05/2022 14:43:50

## 2022-06-05 NOTE — Progress Notes (Signed)
Zachary, [name] ([id number]) Visit Report for 06/05/2022 Chief Complaint Document Details Patient Name: Date of Service: [name] RD L. 06/05/2022 2:45 PM Medical Record Number: [id number] Patient Account Number: [id number] Date of Birth/Sex: Treating RN: 03-Apr-1956 (66 y.o. [name] Primary Care Provider: [name] Other Clinician: Referring Provider: Treating Provider/Extender: [name] in Treatment: 1 Information Obtained from: Patient Chief Complaint 05/24/2022; right lower extremity wound Electronic Signature(s) Signed: 06/05/2022 3:51:23 PM By: [name] Entered By: Kalman Shan on 06/05/2022 15:37:09 -------------------------------------------------------------------------------- HPI Details Patient Name: Date of Service: [name] RD L. 06/05/2022 2:45 PM Medical Record Number: [id number] Patient Account Number: [id number] Date of Birth/Sex: Treating RN: [birth year] (66 y.o. [name] Primary Care Provider: [name] Other Clinician: Referring Provider: Treating Provider/Extender: [name] in Treatment: 1 History of Present Illness HPI Description: Admission 5/2 [name] is a 66 year old male with a past medical history of systolic congestive heart failure, COPD, nonischemic cardiomyopathy, type 2 diabetes, chronic venous insufficiency that presents to the clinic for a 57-monthhistory of nonhealing wound to his left lower extremity. He states that in November 2021 he fell off his porch and Hit his leg against a brick. The wound has healed mostly except for 1 area that is still open. He reports moderate Serosanguineous drainage daily. He has been keeping it covered with Band-Aids. He reports 2 rounds of antibiotics for this issue. He denies pain. He denies current acute signs of infection. 5/9; patient presents for 1 week follow-up. He has been using Hydrofera Blue under 3 layer compression.  He has no issues or complaints today. He denies signs of infection. 5/16; patient presents for 1 week follow-up. He has been using Hydrofera Blue under 3 layer compression. He has no issues or complaints today. He denies signs of infection. He states he overall feels well 5/23; patient presents for 1 week follow-up. He has been using Hydrofera Blue under 3 layer compression. He has no issues or complaints today. He denies signs of infection. 6/6; patient presents for 2-week follow-up. He has been using Hydrofera Blue under 3 layer compression. He has had no issues with this. He denies any signs of infection and has no complaints today. Readmissiondifferent wound 6/27 patient developed another wound to his right lower extremity. He states that 1 week ago he had a large admission for a different wound Paining that was hung over his couch fell on his leg. He has been using alcohol and keeping the area covered. He denies signs of infection. 7/5; patient presents for 1 week follow-up. He has been tolerating Hydrofera Blue under 3 layer compression. He does report some pain to the wound site. He denies signs of infection. 7/11; patient presents for 1 week follow-up. He has been doing well with Hydrofera Blue under 3 layer compression. He reports improvement in pain since last clinic visit. He has no complaints today. He denies signs of infection. 7/18; patient presents for 1 week follow-up. Has been doing well with Hydrofera Blue under 3 layer compression. He denies signs of infection. Admission 05/24/2022 Mr. Zachary Zhongis a 66year old male with a past medical history of venous insufficiency that presents to the clinic for a 1 month history of nonhealing wound to his right lower extremity. He states that a wagon wheel hit it and caused a wound. He has been treated with Keflex for this issue By his podiatrist. He has been using silver alginate to the wound  bed with dressing changes. He currently  denies signs of infection. He is not wearing compression stockings. 8/15; patient presents for follow-up. We have been using antibiotic ointment with Hydrofera Blue under 3 layer compression. He tolerated the compression wrap well. He has no issues or complaints today. He denies signs of infection. 8/22; patient presents for follow-up. We have been using Santyl and Hydrofera Blue under 3 layer compression. He has no issues or complaints today. Electronic Signature(s) Signed: 06/05/2022 3:51:23 PM By: [name] Entered By: Kalman Shan on 06/05/2022 15:37:36 -------------------------------------------------------------------------------- Physical Exam Details Patient Name: Date of Service: [name] RD L. 06/05/2022 2:45 PM Medical Record Number: [id number] Patient Account Number: [id number] Date of Birth/Sex: Treating RN: [birth year] (66 y.o. [name] Primary Care Provider: [name] Other Clinician: Referring Provider: Treating Provider/Extender: [name] in Treatment: 1 Constitutional respirations regular, non-labored and within target range for patient.. Cardiovascular 2+ dorsalis pedis/posterior tibialis pulses. Psychiatric pleasant and cooperative. Notes Right lower extremity: T the Proximal Medial aspect there is an open wound with granulation tissue. Venous stasis dermatitis o Electronic Signature(s) Signed: 06/05/2022 3:51:23 PM By: [name] Entered By: Kalman Shan on 06/05/2022 15:38:13 -------------------------------------------------------------------------------- Physician Orders Details Patient Name: Date of Service: [name] RD L. 06/05/2022 2:45 PM Medical Record Number: [id number] Patient Account Number: [id number] Date of Birth/Sex: Treating RN: 1956-01-03 (66 y.o. [name] Primary Care Provider: [name] Other Clinician: Referring Provider: Treating Provider/Extender: [name] in Treatment: 1 Verbal / Phone Orders: No Diagnosis Coding Follow-up Appointments ppointment in 1 week. - 06/12/22 @ 2:45pm with Dr. Heber [place] and Zachary Anna, RN (room 7) Return A Anesthetic (In clinic) Topical Lidocaine 5% applied to wound bed (In clinic) Topical Lidocaine 4% applied to wound bed Bathing/ Shower/ Hygiene May shower with protection but do not get wound dressing(s) wet. - May shower using cast protector bag. Edema Control - Lymphedema / SCD / Other Elevate legs to the level of the heart or above for 30 minutes daily and/or when sitting, a frequency of: - several times a day Avoid standing for long periods of time. Additional Orders / Instructions Follow Nutritious Diet Wound Treatment Wound #3 - Lower Leg Wound Laterality: Right, Medial Cleanser: Soap and Water 1 x Per Week/30 Days Discharge Instructions: May shower and wash wound with dial antibacterial soap and water prior to dressing change. Cleanser: Wound Cleanser 1 x Per Week/30 Days Discharge Instructions: Cleanse the wound with wound cleanser prior to applying a clean dressing using gauze sponges, not tissue or cotton balls. Peri-Wound Care: Triamcinolone 15 (g) 1 x Per Week/30 Days Discharge Instructions: Use triamcinolone 15 (g) as directed Peri-Wound Care: Sween Lotion (Moisturizing lotion) 1 x Per Week/30 Days Discharge Instructions: Apply moisturizing lotion as directed Prim Dressing: Promogran Prisma Matrix, 4.34 (sq in) (silver collagen) 1 x Per Week/30 Days ary Discharge Instructions: Moisten collagen with saline or hydrogel Secondary Dressing: ABD Pad, 5x9 1 x Per Week/30 Days Discharge Instructions: Apply over primary dressing as directed. Compression Wrap: ThreePress (3 layer compression wrap) 1 x Per Week/30 Days Discharge Instructions: Apply three layer compression as directed. Electronic Signature(s) Signed: 06/05/2022 3:51:23 PM By: [name] Entered By: Kalman Shan on 06/05/2022 15:38:22 -------------------------------------------------------------------------------- Problem List Details Patient Name: Date of Service: [name] RD L. 06/05/2022 2:45 PM Medical Record Number: [id number] Patient Account Number: [id number] Date of Birth/Sex: Treating RN: [birth year] (66 y.o. [name] Primary Care Provider: [name],  Jeneen Rinks Other Clinician: Referring Provider: Treating Provider/Extender: [name] in Treatment: 1 Active Problems ICD-10 Encounter Code Description Active Date MDM Diagnosis I87.311 Chronic venous hypertension (idiopathic) with ulcer of right lower extremity 05/24/2022 No Yes L97.812 Non-pressure chronic ulcer of other part of right lower leg with fat layer 05/24/2022 No Yes exposed P29.51 Chronic systolic (congestive) heart failure 05/24/2022 No Yes I10 Essential (primary) hypertension 05/24/2022 No Yes I42.8 Other cardiomyopathies 05/24/2022 No Yes J44.9 Chronic obstructive pulmonary disease, unspecified 05/24/2022 No Yes I48.19 Other persistent atrial fibrillation 05/24/2022 No Yes E11.622 Type 2 diabetes mellitus with other skin ulcer 05/24/2022 No Yes T79.8XXA Other early complications of trauma, initial encounter 05/24/2022 No Yes Inactive Problems Resolved Problems Electronic Signature(s) Signed: 06/05/2022 3:51:23 PM By: [name] Entered By: Kalman Shan on 06/05/2022 15:36:45 -------------------------------------------------------------------------------- Progress Note Details Patient Name: Date of Service: [name] RD L. 06/05/2022 2:45 PM Medical Record Number: [id number] Patient Account Number: [id number] Date of Birth/Sex: Treating RN: [birth year] (66 y.o. [name] Primary Care Provider: [name] Other Clinician: Referring Provider: Treating Provider/Extender: [name] in Treatment: 1 Subjective Chief Complaint Information  obtained from Patient 05/24/2022; right lower extremity wound History of Present Illness (HPI) Admission 5/2 Mr. Zegers is a 66 year old male with a past medical history of systolic congestive heart failure, COPD, nonischemic cardiomyopathy, type 2 diabetes, chronic venous insufficiency that presents to the clinic for a 42-monthhistory of nonhealing wound to his left lower extremity. He states that in November 2021 he fell off his porch and Hit his leg against a brick. The wound has healed mostly except for 1 area that is still open. He reports moderate Serosanguineous drainage daily. He has been keeping it covered with Band-Aids. He reports 2 rounds of antibiotics for this issue. He denies pain. He denies current acute signs of infection. 5/9; patient presents for 1 week follow-up. He has been using Hydrofera Blue under 3 layer compression. He has no issues or complaints today. He denies signs of infection. 5/16; patient presents for 1 week follow-up. He has been using Hydrofera Blue under 3 layer compression. He has no issues or complaints today. He denies signs of infection. He states he overall feels well 5/23; patient presents for 1 week follow-up. He has been using Hydrofera Blue under 3 layer compression. He has no issues or complaints today. He denies signs of infection. 6/6; patient presents for 2-week follow-up. He has been using Hydrofera Blue under 3 layer compression. He has had no issues with this. He denies any signs of infection and has no complaints today. Readmissionoodifferent wound 6/27 patient developed another wound to his right lower extremity. He states that 1 week ago he had a large admission for a different wound Paining that was hung over his couch fell on his leg. He has been using alcohol and keeping the area covered. He denies signs of infection. 7/5; patient presents for 1 week follow-up. He has been tolerating Hydrofera Blue under 3 layer compression. He does report  some pain to the wound site. He denies signs of infection. 7/11; patient presents for 1 week follow-up. He has been doing well with Hydrofera Blue under 3 layer compression. He reports improvement in pain since last clinic visit. He has no complaints today. He denies signs of infection. 7/18; patient presents for 1 week follow-up. Has been doing well with Hydrofera Blue under 3 layer compression. He denies signs of infection. Admission 05/24/2022 Mr. RKamerin Grumbineis  a 66 year old male with a past medical history of venous insufficiency that presents to the clinic for a 1 month history of nonhealing wound to his right lower extremity. He states that a wagon wheel hit it and caused a wound. He has been treated with Keflex for this issue By his podiatrist. He has been using silver alginate to the wound bed with dressing changes. He currently denies signs of infection. He is not wearing compression stockings. 8/15; patient presents for follow-up. We have been using antibiotic ointment with Hydrofera Blue under 3 layer compression. He tolerated the compression wrap well. He has no issues or complaints today. He denies signs of infection. 8/22; patient presents for follow-up. We have been using Santyl and Hydrofera Blue under 3 layer compression. He has no issues or complaints today. Patient History Information obtained from Patient, Chart. Family History Heart Disease - Father, Hypertension - Mother,Father,Siblings, Lung Disease - Mother, Thyroid Problems - Mother, No family history of Cancer, Diabetes, Hereditary Spherocytosis, Kidney Disease, Seizures, Stroke, Tuberculosis. Social History Former smoker - quit 2009, Marital Status - Divorced, Alcohol Use - Never, Drug Use - No History, Caffeine Use - Daily - coffee, diet soda. Medical History Eyes Patient has history of Cataracts, Glaucoma Denies history of Optic Neuritis Hematologic/Lymphatic Patient has history of Anemia Respiratory Patient  has history of Asthma, Chronic Obstructive Pulmonary Disease (COPD), Sleep Apnea - sleeps in recliner Denies history of Pneumothorax Cardiovascular Patient has history of Arrhythmia - afib, Congestive Heart Failure, Deep Vein Thrombosis, Myocardial Infarction, Peripheral Venous Disease Endocrine Patient has history of Type II Diabetes Denies history of Type I Diabetes Genitourinary Denies history of End Stage Renal Disease Integumentary (Skin) Denies history of History of Burn Musculoskeletal Patient has history of Osteoarthritis Oncologic Denies history of Received Chemotherapy, Received Radiation Psychiatric Denies history of Anorexia/bulimia, Confinement Anxiety Medical A Surgical History Notes nd Constitutional Symptoms (General Health) obesity Cardiovascular cardiomyopathy, pacemaker/defibulator Gastrointestinal GERD Endocrine thyrotoxicosis Genitourinary hx kidney stones, BPH Musculoskeletal thoracic and lumbosacral spondylosis Objective Constitutional respirations regular, non-labored and within target range for patient.. Vitals Time Taken: 2:41 PM, Height: 70 in, Weight: 253 lbs, BMI: 36.3, Temperature: 98. F, Pulse: 77 bpm, Respiratory Rate: 18 breaths/min, Blood Pressure: 115/74 mmHg. Cardiovascular 2+ dorsalis pedis/posterior tibialis pulses. Psychiatric pleasant and cooperative. General Notes: Right lower extremity: T the Proximal Medial aspect there is an open wound with granulation tissue. Venous stasis dermatitis o Integumentary (Hair, Skin) Wound #3 status is Open. Original cause of wound was Trauma. The date acquired was: 04/15/2022. The wound has been in treatment 1 weeks. The wound is located on the Right,Medial Lower Leg. The wound measures 1.4cm length x 1.2cm width x 0.2cm depth; 1.319cm^2 area and 0.264cm^3 volume. There is Fat Layer (Subcutaneous Tissue) exposed. There is no tunneling or undermining noted. There is a medium amount of  serosanguineous drainage noted. The wound margin is distinct with the outline attached to the wound base. There is medium (34-66%) red granulation within the wound bed. There is a medium (34-66%) amount of necrotic tissue within the wound bed including Adherent Slough. Assessment Active Problems ICD-10 Chronic venous hypertension (idiopathic) with ulcer of right lower extremity Non-pressure chronic ulcer of other part of right lower leg with fat layer exposed Chronic systolic (congestive) heart failure Essential (primary) hypertension Other cardiomyopathies Chronic obstructive pulmonary disease, unspecified Other persistent atrial fibrillation Type 2 diabetes mellitus with other skin ulcer Other early complications of trauma, initial encounter Patient's wound has shown improvement in size and appearance since  last clinic visit. No debridement needed. At this time I recommended switching the dressing to collagen under compression therapy. Follow-up in 1 week. Procedures Wound #3 Pre-procedure diagnosis of Wound #3 is a Venous Leg Ulcer located on the Right,Medial Lower Leg . There was a Three Layer Compression Therapy Procedure by [name], RN. Post procedure Diagnosis Wound #3: Same as Pre-Procedure Plan Follow-up Appointments: Return Appointment in 1 week. - 06/12/22 @ 2:45pm with Dr. Heber  and Zachary Anna, RN (room 7) Anesthetic: (In clinic) Topical Lidocaine 5% applied to wound bed (In clinic) Topical Lidocaine 4% applied to wound bed Bathing/ Shower/ Hygiene: May shower with protection but do not get wound dressing(s) wet. - May shower using cast protector bag. Edema Control - Lymphedema / SCD / Other: Elevate legs to the level of the heart or above for 30 minutes daily and/or when sitting, a frequency of: - several times a day Avoid standing for long periods of time. Additional Orders / Instructions: Follow Nutritious Diet WOUND #3: - Lower Leg Wound Laterality: Right,  Medial Cleanser: Soap and Water 1 x Per Week/30 Days Discharge Instructions: May shower and wash wound with dial antibacterial soap and water prior to dressing change. Cleanser: Wound Cleanser 1 x Per Week/30 Days Discharge Instructions: Cleanse the wound with wound cleanser prior to applying a clean dressing using gauze sponges, not tissue or cotton balls. Peri-Wound Care: Triamcinolone 15 (g) 1 x Per Week/30 Days Discharge Instructions: Use triamcinolone 15 (g) as directed Peri-Wound Care: Sween Lotion (Moisturizing lotion) 1 x Per Week/30 Days Discharge Instructions: Apply moisturizing lotion as directed Prim Dressing: Promogran Prisma Matrix, 4.34 (sq in) (silver collagen) 1 x Per Week/30 Days ary Discharge Instructions: Moisten collagen with saline or hydrogel Secondary Dressing: ABD Pad, 5x9 1 x Per Week/30 Days Discharge Instructions: Apply over primary dressing as directed. Com pression Wrap: ThreePress (3 layer compression wrap) 1 x Per Week/30 Days Discharge Instructions: Apply three layer compression as directed. 1. Collagen under 3 layer compression 2. Follow-up in 1 week Electronic Signature(s) Signed: 06/05/2022 3:51:23 PM By: [name] Entered By: Kalman Shan on 06/05/2022 15:40:01 -------------------------------------------------------------------------------- HxROS Details Patient Name: Date of Service: [name] RD L. 06/05/2022 2:45 PM Medical Record Number: [id number] Patient Account Number: [id number] Date of Birth/Sex: Treating RN: [birth year] (66 y.o. [name] Primary Care Provider: [name] Other Clinician: Referring Provider: Treating Provider/Extender: [name] in Treatment: 1 Information Obtained From Patient Chart Constitutional Symptoms (General Health) Medical History: Past Medical History Notes: obesity Eyes Medical History: Positive for: Cataracts; Glaucoma Negative for: Optic  Neuritis Hematologic/Lymphatic Medical History: Positive for: Anemia Respiratory Medical History: Positive for: Asthma; Chronic Obstructive Pulmonary Disease (COPD); Sleep Apnea - sleeps in recliner Negative for: Pneumothorax Cardiovascular Medical History: Positive for: Arrhythmia - afib; Congestive Heart Failure; Deep Vein Thrombosis; Myocardial Infarction; Peripheral Venous Disease Past Medical History Notes: cardiomyopathy, pacemaker/defibulator Gastrointestinal Medical History: Past Medical History Notes: GERD Endocrine Medical History: Positive for: Type II Diabetes Negative for: Type I Diabetes Past Medical History Notes: thyrotoxicosis Time with diabetes: 10 yrs Treated with: Diet Blood sugar tested every day: No Genitourinary Medical History: Negative for: End Stage Renal Disease Past Medical History Notes: hx kidney stones, BPH Integumentary (Skin) Medical History: Negative for: History of Burn Musculoskeletal Medical History: Positive for: Osteoarthritis Past Medical History Notes: thoracic and lumbosacral spondylosis Oncologic Medical History: Negative for: Received Chemotherapy; Received Radiation Psychiatric Medical History: Negative for: Anorexia/bulimia; Confinement Anxiety HBO Extended History Items Eyes: Eyes: Cataracts  Glaucoma Immunizations Pneumococcal Vaccine: Received Pneumococcal Vaccination: Yes Received Pneumococcal Vaccination On or After 60th Birthday: Yes Implantable Devices Yes Family and Social History Cancer: No; Diabetes: No; Heart Disease: Yes - Father; Hereditary Spherocytosis: No; Hypertension: Yes - Mother,Father,Siblings; Kidney Disease: No; Lung Disease: Yes - Mother; Seizures: No; Stroke: No; Thyroid Problems: Yes - Mother; Tuberculosis: No; Former smoker - quit 2009; Marital Status - Divorced; Alcohol Use: Never; Drug Use: No History; Caffeine Use: Daily - coffee, diet soda; Financial Concerns: No; Food, Clothing or  Shelter Needs: No; Support System Lacking: No; Transportation Concerns: No Electronic Signature(s) Signed: 06/05/2022 3:51:23 PM By: [name] Signed: 06/05/2022 3:57:26 PM By: [name] Entered By: Kalman Shan on 06/05/2022 15:37:43 -------------------------------------------------------------------------------- SuperBill Details Patient Name: Date of Service: [name] RD L. 06/05/2022 Medical Record Number: [id number] Patient Account Number: [id number] Date of Birth/Sex: Treating RN: 23-Jan-1956 (66 y.o. [name] Primary Care Provider: [name] Other Clinician: Referring Provider: Treating Provider/Extender: [name] in Treatment: 1 Diagnosis Coding ICD-10 Codes Code Description [phone] Chronic venous hypertension (idiopathic) with ulcer of right lower extremity L97.812 Non-pressure chronic ulcer of other part of right lower leg with fat layer exposed F02.63 Chronic systolic (congestive) heart failure I10 Essential (primary) hypertension I42.8 Other cardiomyopathies J44.9 Chronic obstructive pulmonary disease, unspecified I48.19 Other persistent atrial fibrillation E11.622 Type 2 diabetes mellitus with other skin ulcer T79.8XXA Other early complications of trauma, initial encounter Facility Procedures CPT4 Code: [id number] Description: (Facility Use Only) [phone] - [place] L[facility] RT LEG ICD-10 Diagnosis Description L97.812 Non-pressure chronic ulcer of other part of right lower leg with fat layer exposed Modifier: Quantity: 1 Physician Procedures : CPT4 Code Description Modifier [id number] - WC PHYS LEVEL 3 - EST PT ICD-10 Diagnosis Description I87.311 Chronic venous hypertension (idiopathic) with ulcer of right lower extremity L97.812 Non-pressure chronic ulcer of other part of right lower  leg with fat layer exposed E11.622 Type 2 diabetes mellitus with other skin ulcer T79.8XXA Other early complications of  trauma, initial encounter Quantity: 1 Electronic Signature(s) Signed: 06/05/2022 3:51:23 PM By: [name] Entered By: Kalman Shan on 06/05/2022 15:42:51

## 2022-06-11 ENCOUNTER — Other Ambulatory Visit (HOSPITAL_COMMUNITY): Payer: Self-pay | Admitting: Internal Medicine

## 2022-06-12 ENCOUNTER — Encounter (HOSPITAL_BASED_OUTPATIENT_CLINIC_OR_DEPARTMENT_OTHER): Payer: Medicare HMO | Admitting: Internal Medicine

## 2022-06-12 DIAGNOSIS — E11622 Type 2 diabetes mellitus with other skin ulcer: Secondary | ICD-10-CM

## 2022-06-12 DIAGNOSIS — I87311 Chronic venous hypertension (idiopathic) with ulcer of right lower extremity: Secondary | ICD-10-CM | POA: Diagnosis not present

## 2022-06-12 DIAGNOSIS — L97812 Non-pressure chronic ulcer of other part of right lower leg with fat layer exposed: Secondary | ICD-10-CM | POA: Diagnosis not present

## 2022-06-12 DIAGNOSIS — T798XXA Other early complications of trauma, initial encounter: Secondary | ICD-10-CM | POA: Diagnosis not present

## 2022-06-12 DIAGNOSIS — I428 Other cardiomyopathies: Secondary | ICD-10-CM | POA: Diagnosis not present

## 2022-06-12 DIAGNOSIS — J449 Chronic obstructive pulmonary disease, unspecified: Secondary | ICD-10-CM | POA: Diagnosis not present

## 2022-06-12 DIAGNOSIS — I110 Hypertensive heart disease with heart failure: Secondary | ICD-10-CM | POA: Diagnosis not present

## 2022-06-12 DIAGNOSIS — I4819 Other persistent atrial fibrillation: Secondary | ICD-10-CM | POA: Diagnosis not present

## 2022-06-12 DIAGNOSIS — I872 Venous insufficiency (chronic) (peripheral): Secondary | ICD-10-CM | POA: Diagnosis not present

## 2022-06-12 DIAGNOSIS — I5022 Chronic systolic (congestive) heart failure: Secondary | ICD-10-CM | POA: Diagnosis not present

## 2022-06-12 NOTE — Progress Notes (Signed)
[name], [name] ([id number]) Visit Report for 06/12/2022 Arrival Information Details Patient Name: Date of Service: [name] RD L. 06/12/2022 2:45 PM Medical Record Number: [id number] Patient Account Number: [id number] Date of Birth/Sex: Treating RN: [birth year] (66 y.o. [name] Primary Care Gennette Shadix: [name] Other Clinician: Referring Evelyn Moch: Treating Alonia Dibuono/Extender: [name] in Treatment: 2 Visit Information History Since Last Visit Added or deleted any medications: No Patient Arrived: [name] Any new allergies or adverse reactions: No Arrival Time: 14:36 Had a fall or experienced change in No Transfer Assistance: None activities of daily living that may affect Patient Identification Verified: Yes risk of falls: Secondary Verification Process Completed: Yes Signs or symptoms of abuse/neglect since last visito No Patient Requires Transmission-Based Precautions: No Hospitalized since last visit: No Patient Has Alerts: Yes Implantable device outside of the clinic excluding No Patient Alerts: Patient on Blood Thinner cellular tissue based products placed in the center Pacemaker/Defibrillator since last visit: Has Dressing in Place as Prescribed: Yes Has Compression in Place as Prescribed: Yes Pain Present Now: No Electronic Signature(s) Signed: 06/12/2022 4:34:50 PM By: [name] Entered By: [name] on 06/12/2022 14:37:08 -------------------------------------------------------------------------------- Compression Therapy Details Patient Name: Date of Service: [name] RD L. 06/12/2022 2:45 PM Medical Record Number: [id number] Patient Account Number: [id number] Date of Birth/Sex: Treating RN: 11/16/1955 (66 y.o. [name] Primary Care Ozell Ferrera: [name] Other Clinician: Referring Talonda Artist: Treating Chapel Silverthorn/Extender: [name] in Treatment: 2 Compression Therapy Performed for Wound  Assessment: Wound #3 Right,Medial Lower Leg Performed By: Clinician [name], RN Compression Type: Three Layer Post Procedure Diagnosis Same as Pre-procedure Electronic Signature(s) Signed: 06/12/2022 4:34:50 PM By: [name] Entered By: [name] on 06/12/2022 15:05:16 -------------------------------------------------------------------------------- Encounter Discharge Information Details Patient Name: Date of Service: [name], [name] RD L. 06/12/2022 2:45 PM Medical Record Number: [id number] Patient Account Number: [id number] Date of Birth/Sex: Treating RN: 1956/05/08 (66 y.o. [name] Primary Care [name]: [name] Other Clinician: Referring Caroline Longie: Treating Kellyann Ordway/Extender: [name] in Treatment: 2 Encounter Discharge Information Items Discharge Condition: Stable Ambulatory Status: [name] Discharge Destination: Home Transportation: Private Auto Schedule Follow-up Appointment: Yes Clinical Summary of Care: Provided on 06/12/2022 Form Type Recipient Paper Patient Patient Electronic Signature(s) Signed: 06/12/2022 4:34:50 PM By: [name] Entered By: [name] on 06/12/2022 15:11:48 -------------------------------------------------------------------------------- Lower Extremity Assessment Details Patient Name: Date of Service: [name] RD L. 06/12/2022 2:45 PM Medical Record Number: [id number] Patient Account Number: [id number] Date of Birth/Sex: Treating RN: 1956/08/25 (66 y.o. [name] Primary Care [name]: [name] Other Clinician: Referring [name]: Treating [name]/Extender: [name] in Treatment: 2 Edema Assessment Assessed: [name]: No] [name]: Yes] Edema: [Left: Ye] [Right: s] Calf Left: Right: Point of Measurement: 36 cm From Medial Instep 34.6 cm Ankle Left: Right: Point of Measurement: 12 cm From Medial Instep 20.4 cm Vascular  Assessment Pulses: Dorsalis Pedis Palpable: [Right:Yes] Electronic Signature(s) Signed: 06/12/2022 4:34:50 PM By: [name] Entered By: [name] on 06/12/2022 14:51:12 -------------------------------------------------------------------------------- Multi Wound Chart Details Patient Name: Date of Service: [name] RD L. 06/12/2022 2:45 PM Medical Record Number: [id number] Patient Account Number: [id number] Date of Birth/Sex: Treating RN: 1956-09-10 (66 y.o. [name] Primary Care [name]: [name] Other Clinician: Referring [name]: Treating [name]/Extender: [name] in Treatment: 2 Vital Signs Height(in): 70 Pulse(bpm): 69 Weight(lbs): 253 Blood Pressure(mmHg): 118/75 Body Mass Index(BMI): 36.3 Temperature(F): 98.6 Respiratory Rate(breaths/min): 18 Photos: [N/A:N/A]  Right, Medial Lower Leg N/A N/A Wound Location: Trauma N/A N/A Wounding Event: Venous Leg Ulcer N/A N/A Primary Etiology: Cataracts, Glaucoma, Anemia, N/A N/A Comorbid History: Asthma, Chronic Obstructive Pulmonary Disease (COPD), Sleep Apnea, Arrhythmia, Congestive Heart Failure, Deep Vein Thrombosis, Myocardial Infarction, Peripheral Venous Disease, Type II Diabetes, Osteoarthritis 04/15/2022 N/A N/A Date Acquired: 2 N/A N/A Weeks of Treatment: Open N/A N/A Wound Status: No N/A N/A Wound Recurrence: 1.2x1x0.2 N/A N/A Measurements L x W x D (cm) 0.942 N/A N/A A (cm) : rea 0.188 N/A N/A Volume (cm) : 68.40% N/A N/A % Reduction in Area: 68.50% N/A N/A % Reduction in Volume: Full Thickness Without Exposed N/A N/A Classification: Support Structures Medium N/A N/A Exudate Amount: Serosanguineous N/A N/A Exudate Type: red, brown N/A N/A Exudate Color: Distinct, outline attached N/A N/A Wound Margin: Large (67-100%) N/A N/A Granulation Amount: Red N/A N/A Granulation Quality: Small (1-33%) N/A N/A Necrotic Amount: Fat Layer  (Subcutaneous Tissue): Yes N/A N/A Exposed Structures: Fascia: No Tendon: No Muscle: No Joint: No Bone: No Medium (34-66%) N/A N/A Epithelialization: Compression Therapy N/A N/A Procedures Performed: Treatment Notes Wound #3 (Lower Leg) Wound Laterality: Right, Medial Cleanser Soap and Water Discharge Instruction: May shower and wash wound with dial antibacterial soap and water prior to dressing change. Wound Cleanser Discharge Instruction: Cleanse the wound with wound cleanser prior to applying a clean dressing using gauze sponges, not tissue or cotton balls. Peri-Wound Care Triamcinolone 15 (g) Discharge Instruction: Use triamcinolone 15 (g) as directed Sween Lotion (Moisturizing lotion) Discharge Instruction: Apply moisturizing lotion as directed Topical Primary Dressing Promogran Prisma Matrix, 4.34 (sq in) (silver collagen) Discharge Instruction: Moisten collagen with saline or hydrogel Secondary Dressing ABD Pad, 5x9 Discharge Instruction: Apply over primary dressing as directed. Secured With Compression Wrap ThreePress (3 layer compression wrap) Discharge Instruction: Apply three layer compression as directed. Compression Stockings Add-Ons Electronic Signature(s) Signed: 06/12/2022 3:46:11 PM By: [name] Signed: 06/12/2022 4:34:50 PM By: Fara Chute By: Kalman Shan on 06/12/2022 15:39:50 -------------------------------------------------------------------------------- Multi-Disciplinary Care Plan Details Patient Name: Date of Service: [name], [name] RD L. 06/12/2022 2:45 PM Medical Record Number: [id number] Patient Account Number: [id number] Date of Birth/Sex: Treating RN: 1956-10-11 (66 y.o. [name] Primary Care Ksenia Kunz: [name] Other Clinician: Referring Christyl Osentoski: Treating Tel Hevia/Extender: [name] in Treatment: 2 Active Inactive Wound/Skin Impairment Nursing Diagnoses: Impaired tissue  integrity Goals: Patient/caregiver will verbalize understanding of skin care regimen Date Initiated: 05/24/2022 Target Resolution Date: 06/21/2022 Goal Status: Active Ulcer/skin breakdown will have a volume reduction of 30% by week 4 Date Initiated: 05/24/2022 Target Resolution Date: 06/21/2022 Goal Status: Active Interventions: Assess patient/caregiver ability to obtain necessary supplies Assess patient/caregiver ability to perform ulcer/skin care regimen upon admission and as needed Provide education on ulcer and skin care Treatment Activities: Topical wound management initiated : 05/24/2022 Notes: Electronic Signature(s) Signed: 06/12/2022 4:34:50 PM By: [name] Entered By: [name] on 06/12/2022 14:36:12 -------------------------------------------------------------------------------- Pain Assessment Details Patient Name: Date of Service: [name] RD L. 06/12/2022 2:45 PM Medical Record Number: [id number] Patient Account Number: [id number] Date of Birth/Sex: Treating RN: 04-13-1956 (66 y.o. [name] Primary Care Tysen Roesler: [name] Other Clinician: Referring Jakyla Reza: Treating Bakari Nikolai/Extender: [name] in Treatment: 2 Active Problems Location of Pain Severity and Description of Pain Patient Has Paino No Site Locations Pain Management and Medication Current Pain Management: Electronic Signature(s) Signed: 06/12/2022 4:34:50 PM By: [name] Entered By: [name] on 06/12/2022 14:42:43 -------------------------------------------------------------------------------- Patient/Caregiver  Education Details Patient Name: Date of Service: [name] RD Carlean Jews 8/29/2023andnbsp2:45 PM Medical Record Number: [id number] Patient Account Number: [id number] Date of Birth/Gender: Treating RN: 1956/01/19 (66 y.o. [name] Primary Care Physician: [name] Other Clinician: Referring Physician: Treating  Physician/Extender: [name] in Treatment: 2 Education Assessment Education Provided To: Patient Education Topics Provided Venous: Methods: Explain/Verbal, Printed Responses: State content correctly Wound/Skin Impairment: Methods: Explain/Verbal, Printed Responses: State content correctly Electronic Signature(s) Signed: 06/12/2022 4:34:50 PM By: [name] Entered By: [name] on 06/12/2022 14:36:34 -------------------------------------------------------------------------------- Wound Assessment Details Patient Name: Date of Service: [name] RD L. 06/12/2022 2:45 PM Medical Record Number: [id number] Patient Account Number: [id number] Date of Birth/Sex: Treating RN: 1955-11-13 (66 y.o. [name] Primary Care Temiloluwa Laredo: [name] Other Clinician: Referring Sebasthian Stailey: Treating Charlissa Petros/Extender: [name] in Treatment: 2 Wound Status Wound Number: 3 Primary Venous Leg Ulcer Etiology: Wound Location: Right, Medial Lower Leg Wound Open Wounding Event: Trauma Status: Date Acquired: 04/15/2022 Comorbid Cataracts, Glaucoma, Anemia, Asthma, Chronic Obstructive Weeks Of Treatment: 2 History: Pulmonary Disease (COPD), Sleep Apnea, Arrhythmia, Congestive Clustered Wound: No Heart Failure, Deep Vein Thrombosis, Myocardial Infarction, Peripheral Venous Disease, Type II Diabetes, Osteoarthritis Photos Wound Measurements Length: (cm) 1.2 Width: (cm) 1 Depth: (cm) 0.2 Area: (cm) 0.942 Volume: (cm) 0.188 % Reduction in Area: 68.4% % Reduction in Volume: 68.5% Epithelialization: Medium (34-66%) Tunneling: No Undermining: No Wound Description Classification: Full Thickness Without Exposed Support Structures Wound Margin: Distinct, outline attached Exudate Amount: Medium Exudate Type: Serosanguineous Exudate Color: red, brown Foul Odor After Cleansing: No Slough/Fibrino Yes Wound Bed Granulation Amount:  Large (67-100%) Exposed Structure Granulation Quality: Red Fascia Exposed: No Necrotic Amount: Small (1-33%) Fat Layer (Subcutaneous Tissue) Exposed: Yes Necrotic Quality: Adherent Slough Tendon Exposed: No Muscle Exposed: No Joint Exposed: No Bone Exposed: No Treatment Notes Wound #3 (Lower Leg) Wound Laterality: Right, Medial Cleanser Soap and Water Discharge Instruction: May shower and wash wound with dial antibacterial soap and water prior to dressing change. Wound Cleanser Discharge Instruction: Cleanse the wound with wound cleanser prior to applying a clean dressing using gauze sponges, not tissue or cotton balls. Peri-Wound Care Triamcinolone 15 (g) Discharge Instruction: Use triamcinolone 15 (g) as directed Sween Lotion (Moisturizing lotion) Discharge Instruction: Apply moisturizing lotion as directed Topical Primary Dressing Promogran Prisma Matrix, 4.34 (sq in) (silver collagen) Discharge Instruction: Moisten collagen with saline or hydrogel Secondary Dressing ABD Pad, 5x9 Discharge Instruction: Apply over primary dressing as directed. Secured With Compression Wrap ThreePress (3 layer compression wrap) Discharge Instruction: Apply three layer compression as directed. Compression Stockings Add-Ons Electronic Signature(s) Signed: 06/12/2022 4:34:50 PM By: [name] Entered By: [name] on 06/12/2022 14:56:24 -------------------------------------------------------------------------------- Vitals Details Patient Name: Date of Service: [name], [name] RD L. 06/12/2022 2:45 PM Medical Record Number: [id number] Patient Account Number: [id number] Date of Birth/Sex: Treating RN: Jul 06, 1956 (66 y.o. [name] Primary Care Elsi Stelzer: [name] Other Clinician: Referring Bassel Gaskill: Treating Yony Roulston/Extender: [name] in Treatment: 2 Vital Signs Time Taken: 14:40 Temperature (F): 98.6 Height (in): 70 Pulse (bpm): 81 Weight  (lbs): 253 Respiratory Rate (breaths/min): 18 Body Mass Index (BMI): 36.3 Blood Pressure (mmHg): 118/75 Reference Range: 80 - 120 mg / dl Electronic Signature(s) Signed: 06/12/2022 4:34:50 PM By: [name] Entered By: [name] on 06/12/2022 14:42:32

## 2022-06-12 NOTE — Progress Notes (Signed)
[name], Zachary ([id number]) Visit Report for 06/12/2022 Chief Complaint Document Details Patient Name: Date of Service: [name] RD L. 06/12/2022 2:45 PM Medical Record Number: [id number] Patient Account Number: [id number] Date of Birth/Sex: Treating RN: 03-07-1956 (66 y.o. [name] Primary Care Provider: [name] Other Clinician: Referring Provider: Treating Provider/Extender: [name] in Treatment: 2 Information Obtained from: Patient Chief Complaint 05/24/2022; right lower extremity wound Electronic Signature(s) Signed: 06/12/2022 3:46:11 PM By: [name] Entered By: Kalman Shan on 06/12/2022 15:40:03 -------------------------------------------------------------------------------- HPI Details Patient Name: Date of Service: [name] RD L. 06/12/2022 2:45 PM Medical Record Number: [id number] Patient Account Number: [id number] Date of Birth/Sex: Treating RN: January 22, 1956 (66 y.o. [name] Primary Care Provider: [name] Other Clinician: Referring Provider: Treating Provider/Extender: [name] in Treatment: 2 History of Present Illness HPI Description: Admission 5/2 [name] is a 66 year old male with a past medical history of systolic congestive heart failure, COPD, nonischemic cardiomyopathy, type 2 diabetes, chronic venous insufficiency that presents to the clinic for a 67-monthhistory of nonhealing wound to his left lower extremity. He states that in November 2021 he fell off his porch and Hit his leg against a brick. The wound has healed mostly except for 1 area that is still open. He reports moderate Serosanguineous drainage daily. He has been keeping it covered with Band-Aids. He reports 2 rounds of antibiotics for this issue. He denies pain. He denies current acute signs of infection. 5/9; patient presents for 1 week follow-up. He has been using Hydrofera Blue under 3 layer compression.  He has no issues or complaints today. He denies signs of infection. 5/16; patient presents for 1 week follow-up. He has been using Hydrofera Blue under 3 layer compression. He has no issues or complaints today. He denies signs of infection. He states he overall feels well 5/23; patient presents for 1 week follow-up. He has been using Hydrofera Blue under 3 layer compression. He has no issues or complaints today. He denies signs of infection. 6/6; patient presents for 2-week follow-up. He has been using Hydrofera Blue under 3 layer compression. He has had no issues with this. He denies any signs of infection and has no complaints today. Readmissiondifferent wound 6/27 patient developed another wound to his right lower extremity. He states that 1 week ago he had a large admission for a different wound Paining that was hung over his couch fell on his leg. He has been using alcohol and keeping the area covered. He denies signs of infection. 7/5; patient presents for 1 week follow-up. He has been tolerating Hydrofera Blue under 3 layer compression. He does report some pain to the wound site. He denies signs of infection. 7/11; patient presents for 1 week follow-up. He has been doing well with Hydrofera Blue under 3 layer compression. He reports improvement in pain since last clinic visit. He has no complaints today. He denies signs of infection. 7/18; patient presents for 1 week follow-up. Has been doing well with Hydrofera Blue under 3 layer compression. He denies signs of infection. Admission 05/24/2022 Mr. RAdryen Cooksonis a [age]year old male with a past medical history of venous insufficiency that presents to the clinic for a 1 month history of nonhealing wound to his right lower extremity. He states that a wagon wheel hit it and caused a wound. He has been treated with Keflex for this issue By his podiatrist. He has been using silver alginate to the wound  bed with dressing changes. He currently  denies signs of infection. He is not wearing compression stockings. 8/15; patient presents for follow-up. We have been using antibiotic ointment with Hydrofera Blue under 3 layer compression. He tolerated the compression wrap well. He has no issues or complaints today. He denies signs of infection. 8/22; patient presents for follow-up. We have been using Santyl and Hydrofera Blue under 3 layer compression. He has no issues or complaints today. 8/29; patient presents for follow-up. We have been using collagen to the wound bed under 3 layer compression. He has no issues or complaints today. Electronic Signature(s) Signed: 06/12/2022 3:46:11 PM By: [name] Entered By: Kalman Shan on 06/12/2022 15:40:35 -------------------------------------------------------------------------------- Physical Exam Details Patient Name: Date of Service: [name] RD L. 06/12/2022 2:45 PM Medical Record Number: [id number] Patient Account Number: [id number] Date of Birth/Sex: Treating RN: 05/22/1956 (66 y.o. [name] Primary Care Provider: [name] Other Clinician: Referring Provider: Treating Provider/Extender: [name] in Treatment: 2 Constitutional respirations regular, non-labored and within target range for patient.. Cardiovascular 2+ dorsalis pedis/posterior tibialis pulses. Psychiatric pleasant and cooperative. Notes Right lower extremity: T the Proximal Medial aspect there is an open wound with granulation tissue. Venous stasis dermatitis o Electronic Signature(s) Signed: 06/12/2022 3:46:11 PM By: [name] Entered By: Kalman Shan on 06/12/2022 15:41:11 -------------------------------------------------------------------------------- Physician Orders Details Patient Name: Date of Service: [name] RD L. 06/12/2022 2:45 PM Medical Record Number: [id number] Patient Account Number: [id number] Date of Birth/Sex: Treating  RN: 01/04/1956 (66 y.o. [name] Primary Care Provider: [name] Other Clinician: Referring Provider: Treating Provider/Extender: [name] in Treatment: 2 Verbal / Phone Orders: No Diagnosis Coding ICD-10 Coding Code Description I87.311 Chronic venous hypertension (idiopathic) with ulcer of right lower extremity L97.812 Non-pressure chronic ulcer of other part of right lower leg with fat layer exposed G86.76 Chronic systolic (congestive) heart failure I10 Essential (primary) hypertension I42.8 Other cardiomyopathies J44.9 Chronic obstructive pulmonary disease, unspecified I48.19 Other persistent atrial fibrillation E11.622 Type 2 diabetes mellitus with other skin ulcer T79.8XXA Other early complications of trauma, initial encounter Follow-up Appointments ppointment in 1 week. - 06/21/22 @ 2:45pm with Dr. Heber [place] and Leveda Anna, RN (room 7) Return A ppointment in 2 weeks. - 06/26/22 @ 2:45pm Return A Anesthetic (In clinic) Topical Lidocaine 5% applied to wound bed (In clinic) Topical Lidocaine 4% applied to wound bed Bathing/ Shower/ Hygiene May shower with protection but do not get wound dressing(s) wet. - May shower using cast protector bag. Edema Control - Lymphedema / SCD / Other Elevate legs to the level of the heart or above for 30 minutes daily and/or when sitting, a frequency of: - several times a day Avoid standing for long periods of time. Additional Orders / Instructions Follow Nutritious Diet Wound Treatment Wound #3 - Lower Leg Wound Laterality: Right, Medial Cleanser: Soap and Water 1 x Per Week/30 Days Discharge Instructions: May shower and wash wound with dial antibacterial soap and water prior to dressing change. Cleanser: Wound Cleanser 1 x Per Week/30 Days Discharge Instructions: Cleanse the wound with wound cleanser prior to applying a clean dressing using gauze sponges, not tissue or cotton balls. Peri-Wound Care: Triamcinolone  15 (g) 1 x Per Week/30 Days Discharge Instructions: Use triamcinolone 15 (g) as directed Peri-Wound Care: Sween Lotion (Moisturizing lotion) 1 x Per Week/30 Days Discharge Instructions: Apply moisturizing lotion as directed Prim Dressing: Promogran Prisma Matrix, 4.34 (sq in) (silver collagen) 1 x Per  Week/30 Days ary Discharge Instructions: Moisten collagen with saline or hydrogel Secondary Dressing: ABD Pad, 5x9 1 x Per Week/30 Days Discharge Instructions: Apply over primary dressing as directed. Compression Wrap: ThreePress (3 layer compression wrap) 1 x Per Week/30 Days Discharge Instructions: Apply three layer compression as directed. Electronic Signature(s) Signed: 06/12/2022 3:46:11 PM By: [name] Entered By: Kalman Shan on 06/12/2022 15:41:19 -------------------------------------------------------------------------------- Problem List Details Patient Name: Date of Service: [name] RD L. 06/12/2022 2:45 PM Medical Record Number: [id number] Patient Account Number: [id number] Date of Birth/Sex: Treating RN: 04-18-1956 (66 y.o. [name] Primary Care Provider: [name] Other Clinician: Referring Provider: Treating Provider/Extender: [name] in Treatment: 2 Active Problems ICD-10 Encounter Code Description Active Date MDM Diagnosis I87.311 Chronic venous hypertension (idiopathic) with ulcer of right lower extremity 05/24/2022 No Yes L97.812 Non-pressure chronic ulcer of other part of right lower leg with fat layer 05/24/2022 No Yes exposed K53.97 Chronic systolic (congestive) heart failure 05/24/2022 No Yes I10 Essential (primary) hypertension 05/24/2022 No Yes I42.8 Other cardiomyopathies 05/24/2022 No Yes J44.9 Chronic obstructive pulmonary disease, unspecified 05/24/2022 No Yes I48.19 Other persistent atrial fibrillation 05/24/2022 No Yes E11.622 Type 2 diabetes mellitus with other skin ulcer 05/24/2022 No Yes T79.8XXA  Other early complications of trauma, initial encounter 05/24/2022 No Yes Inactive Problems Resolved Problems Electronic Signature(s) Signed: 06/12/2022 3:46:11 PM By: [name] Entered By: Kalman Shan on 06/12/2022 15:39:44 -------------------------------------------------------------------------------- Progress Note Details Patient Name: Date of Service: [name] RD L. 06/12/2022 2:45 PM Medical Record Number: [id number] Patient Account Number: [id number] Date of Birth/Sex: Treating RN: 1955/12/26 (66 y.o. [name] Primary Care Provider: [name] Other Clinician: Referring Provider: Treating Provider/Extender: [name] in Treatment: 2 Subjective Chief Complaint Information obtained from Patient 05/24/2022; right lower extremity wound History of Present Illness (HPI) Admission 5/2 Mr. Kozuch is a 66 year old male with a past medical history of systolic congestive heart failure, COPD, nonischemic cardiomyopathy, type 2 diabetes, chronic venous insufficiency that presents to the clinic for a 75-monthhistory of nonhealing wound to his left lower extremity. He states that in November 2021 he fell off his porch and Hit his leg against a brick. The wound has healed mostly except for 1 area that is still open. He reports moderate Serosanguineous drainage daily. He has been keeping it covered with Band-Aids. He reports 2 rounds of antibiotics for this issue. He denies pain. He denies current acute signs of infection. 5/9; patient presents for 1 week follow-up. He has been using Hydrofera Blue under 3 layer compression. He has no issues or complaints today. He denies signs of infection. 5/16; patient presents for 1 week follow-up. He has been using Hydrofera Blue under 3 layer compression. He has no issues or complaints today. He denies signs of infection. He states he overall feels well 5/23; patient presents for 1 week follow-up. He has  been using Hydrofera Blue under 3 layer compression. He has no issues or complaints today. He denies signs of infection. 6/6; patient presents for 2-week follow-up. He has been using Hydrofera Blue under 3 layer compression. He has had no issues with this. He denies any signs of infection and has no complaints today. Readmissionoodifferent wound 6/27 patient developed another wound to his right lower extremity. He states that 1 week ago he had a large admission for a different wound Paining that was hung over his couch fell on his leg. He has been using alcohol and keeping  the area covered. He denies signs of infection. 7/5; patient presents for 1 week follow-up. He has been tolerating Hydrofera Blue under 3 layer compression. He does report some pain to the wound site. He denies signs of infection. 7/11; patient presents for 1 week follow-up. He has been doing well with Hydrofera Blue under 3 layer compression. He reports improvement in pain since last clinic visit. He has no complaints today. He denies signs of infection. 7/18; patient presents for 1 week follow-up. Has been doing well with Hydrofera Blue under 3 layer compression. He denies signs of infection. Admission 05/24/2022 Mr. Randale Carvalho is a 66 year old male with a past medical history of venous insufficiency that presents to the clinic for a 1 month history of nonhealing wound to his right lower extremity. He states that a wagon wheel hit it and caused a wound. He has been treated with Keflex for this issue By his podiatrist. He has been using silver alginate to the wound bed with dressing changes. He currently denies signs of infection. He is not wearing compression stockings. 8/15; patient presents for follow-up. We have been using antibiotic ointment with Hydrofera Blue under 3 layer compression. He tolerated the compression wrap well. He has no issues or complaints today. He denies signs of infection. 8/22; patient presents for  follow-up. We have been using Santyl and Hydrofera Blue under 3 layer compression. He has no issues or complaints today. 8/29; patient presents for follow-up. We have been using collagen to the wound bed under 3 layer compression. He has no issues or complaints today. Patient History Information obtained from Patient, Chart. Family History Heart Disease - Father, Hypertension - Mother,Father,Siblings, Lung Disease - Mother, Thyroid Problems - Mother, No family history of Cancer, Diabetes, Hereditary Spherocytosis, Kidney Disease, Seizures, Stroke, Tuberculosis. Social History Former smoker - quit 2009, Marital Status - Divorced, Alcohol Use - Never, Drug Use - No History, Caffeine Use - Daily - coffee, diet soda. Medical History Eyes Patient has history of Cataracts, Glaucoma Denies history of Optic Neuritis Hematologic/Lymphatic Patient has history of Anemia Respiratory Patient has history of Asthma, Chronic Obstructive Pulmonary Disease (COPD), Sleep Apnea - sleeps in recliner Denies history of Pneumothorax Cardiovascular Patient has history of Arrhythmia - afib, Congestive Heart Failure, Deep Vein Thrombosis, Myocardial Infarction, Peripheral Venous Disease Endocrine Patient has history of Type II Diabetes Denies history of Type I Diabetes Genitourinary Denies history of End Stage Renal Disease Integumentary (Skin) Denies history of History of Burn Musculoskeletal Patient has history of Osteoarthritis Oncologic Denies history of Received Chemotherapy, Received Radiation Psychiatric Denies history of Anorexia/bulimia, Confinement Anxiety Medical A Surgical History Notes nd Constitutional Symptoms (General Health) obesity Cardiovascular cardiomyopathy, pacemaker/defibulator Gastrointestinal GERD Endocrine thyrotoxicosis Genitourinary hx kidney stones, BPH Musculoskeletal thoracic and lumbosacral spondylosis Objective Constitutional respirations regular, non-labored  and within target range for patient.. Vitals Time Taken: 2:40 PM, Height: 70 in, Weight: 253 lbs, BMI: 36.3, Temperature: 98.6 F, Pulse: 81 bpm, Respiratory Rate: 18 breaths/min, Blood Pressure: 118/75 mmHg. Cardiovascular 2+ dorsalis pedis/posterior tibialis pulses. Psychiatric pleasant and cooperative. General Notes: Right lower extremity: T the Proximal Medial aspect there is an open wound with granulation tissue. Venous stasis dermatitis o Integumentary (Hair, Skin) Wound #3 status is Open. Original cause of wound was Trauma. The date acquired was: 04/15/2022. The wound has been in treatment 2 weeks. The wound is located on the Right,Medial Lower Leg. The wound measures 1.2cm length x 1cm width x 0.2cm depth; 0.942cm^2 area and 0.188cm^3 volume. There is Fat  Layer (Subcutaneous Tissue) exposed. There is no tunneling or undermining noted. There is a medium amount of serosanguineous drainage noted. The wound margin is distinct with the outline attached to the wound base. There is large (67-100%) red granulation within the wound bed. There is a small (1-33%) amount of necrotic tissue within the wound bed including Adherent Slough. Assessment Active Problems ICD-10 Chronic venous hypertension (idiopathic) with ulcer of right lower extremity Non-pressure chronic ulcer of other part of right lower leg with fat layer exposed Chronic systolic (congestive) heart failure Essential (primary) hypertension Other cardiomyopathies Chronic obstructive pulmonary disease, unspecified Other persistent atrial fibrillation Type 2 diabetes mellitus with other skin ulcer Other early complications of trauma, initial encounter Patient's wound has shown improvement in size in appearance since last clinic visit. I recommended continuing the course with collagen under 3 layer compression. Follow-up in 1 week Procedures Wound #3 Pre-procedure diagnosis of Wound #3 is a Venous Leg Ulcer located on the  Right,Medial Lower Leg . There was a Three Layer Compression Therapy Procedure by [name], RN. Post procedure Diagnosis Wound #3: Same as Pre-Procedure Plan Follow-up Appointments: Return Appointment in 1 week. - 06/21/22 @ 2:45pm with Dr. Heber [place] and Leveda Anna, RN (room 7) Return Appointment in 2 weeks. - 06/26/22 @ 2:45pm Anesthetic: (In clinic) Topical Lidocaine 5% applied to wound bed (In clinic) Topical Lidocaine 4% applied to wound bed Bathing/ Shower/ Hygiene: May shower with protection but do not get wound dressing(s) wet. - May shower using cast protector bag. Edema Control - Lymphedema / SCD / Other: Elevate legs to the level of the heart or above for 30 minutes daily and/or when sitting, a frequency of: - several times a day Avoid standing for long periods of time. Additional Orders / Instructions: Follow Nutritious Diet WOUND #3: - Lower Leg Wound Laterality: Right, Medial Cleanser: Soap and Water 1 x Per Week/30 Days Discharge Instructions: May shower and wash wound with dial antibacterial soap and water prior to dressing change. Cleanser: Wound Cleanser 1 x Per Week/30 Days Discharge Instructions: Cleanse the wound with wound cleanser prior to applying a clean dressing using gauze sponges, not tissue or cotton balls. Peri-Wound Care: Triamcinolone 15 (g) 1 x Per Week/30 Days Discharge Instructions: Use triamcinolone 15 (g) as directed Peri-Wound Care: Sween Lotion (Moisturizing lotion) 1 x Per Week/30 Days Discharge Instructions: Apply moisturizing lotion as directed Prim Dressing: Promogran Prisma Matrix, 4.34 (sq in) (silver collagen) 1 x Per Week/30 Days ary Discharge Instructions: Moisten collagen with saline or hydrogel Secondary Dressing: ABD Pad, 5x9 1 x Per Week/30 Days Discharge Instructions: Apply over primary dressing as directed. Com pression Wrap: ThreePress (3 layer compression wrap) 1 x Per Week/30 Days Discharge Instructions: Apply three layer compression  as directed. 1. Collagen under 3 layer compression 2. Follow-up in 1 week Electronic Signature(s) Signed: 06/12/2022 3:46:11 PM By: [name] Entered By: Kalman Shan on 06/12/2022 15:41:45 -------------------------------------------------------------------------------- HxROS Details Patient Name: Date of Service: [name] RD L. 06/12/2022 2:45 PM Medical Record Number: [id number] Patient Account Number: [id number] Date of Birth/Sex: Treating RN: October 26, 1955 (66 y.o. [name] Primary Care Provider: [name] Other Clinician: Referring Provider: Treating Provider/Extender: [name] in Treatment: 2 Information Obtained From Patient Chart Constitutional Symptoms (General Health) Medical History: Past Medical History Notes: obesity Eyes Medical History: Positive for: Cataracts; Glaucoma Negative for: Optic Neuritis Hematologic/Lymphatic Medical History: Positive for: Anemia Respiratory Medical History: Positive for: Asthma; Chronic Obstructive Pulmonary Disease (COPD); Sleep Apnea -  sleeps in recliner Negative for: Pneumothorax Cardiovascular Medical History: Positive for: Arrhythmia - afib; Congestive Heart Failure; Deep Vein Thrombosis; Myocardial Infarction; Peripheral Venous Disease Past Medical History Notes: cardiomyopathy, pacemaker/defibulator Gastrointestinal Medical History: Past Medical History Notes: GERD Endocrine Medical History: Positive for: Type II Diabetes Negative for: Type I Diabetes Past Medical History Notes: thyrotoxicosis Time with diabetes: 10 yrs Treated with: Diet Blood sugar tested every day: No Genitourinary Medical History: Negative for: End Stage Renal Disease Past Medical History Notes: hx kidney stones, BPH Integumentary (Skin) Medical History: Negative for: History of Burn Musculoskeletal Medical History: Positive for: Osteoarthritis Past Medical History Notes: thoracic  and lumbosacral spondylosis Oncologic Medical History: Negative for: Received Chemotherapy; Received Radiation Psychiatric Medical History: Negative for: Anorexia/bulimia; Confinement Anxiety HBO Extended History Items Eyes: Eyes: Cataracts Glaucoma Immunizations Pneumococcal Vaccine: Received Pneumococcal Vaccination: Yes Received Pneumococcal Vaccination On or After 60th Birthday: Yes Implantable Devices Yes Family and Social History Cancer: No; Diabetes: No; Heart Disease: Yes - Father; Hereditary Spherocytosis: No; Hypertension: Yes - Mother,Father,Siblings; Kidney Disease: No; Lung Disease: Yes - Mother; Seizures: No; Stroke: No; Thyroid Problems: Yes - Mother; Tuberculosis: No; Former smoker - quit 2009; Marital Status - Divorced; Alcohol Use: Never; Drug Use: No History; Caffeine Use: Daily - coffee, diet soda; Financial Concerns: No; Food, Clothing or Shelter Needs: No; Support System Lacking: No; Transportation Concerns: No Electronic Signature(s) Signed: 06/12/2022 3:46:11 PM By: [name] Signed: 06/12/2022 4:34:50 PM By: [name] Entered By: Kalman Shan on 06/12/2022 15:40:54 -------------------------------------------------------------------------------- [place] Details Patient Name: Date of Service: [name] RD L. 06/12/2022 Medical Record Number: [id number] Patient Account Number: [id number] Date of Birth/Sex: Treating RN: [birth year] (66 y.o. [name] Primary Care Provider: [name] Other Clinician: Referring Provider: Treating Provider/Extender: [name] in Treatment: 2 Diagnosis Coding ICD-10 Codes Code Description [id number] Chronic venous hypertension (idiopathic) with ulcer of right lower extremity L97.812 Non-pressure chronic ulcer of other part of right lower leg with fat layer exposed M54.65 Chronic systolic (congestive) heart failure I10 Essential (primary) hypertension I42.8 Other  cardiomyopathies J44.9 Chronic obstructive pulmonary disease, unspecified I48.19 Other persistent atrial fibrillation E11.622 Type 2 diabetes mellitus with other skin ulcer T79.8XXA Other early complications of trauma, initial encounter Facility Procedures CPT4 Code: [id number] Description: (Facility Use Only) [phone] - [facility] RT LEG ICD-10 Diagnosis Description L97.812 Non-pressure chronic ulcer of other part of right lower leg with fat layer exposed Modifier: Quantity: 1 Physician Procedures : CPT4 Code Description Modifier [id number] - WC PHYS LEVEL 3 - EST PT ICD-10 Diagnosis Description I87.311 Chronic venous hypertension (idiopathic) with ulcer of right lower extremity L97.812 Non-pressure chronic ulcer of other part of right lower  leg with fat layer exposed E11.622 Type 2 diabetes mellitus with other skin ulcer T79.8XXA Other early complications of trauma, initial encounter Quantity: 1 Electronic Signature(s) Signed: 06/12/2022 3:46:11 PM By: [name] Entered By: Kalman Shan on 06/12/2022 15:42:27

## 2022-06-13 ENCOUNTER — Ambulatory Visit (INDEPENDENT_AMBULATORY_CARE_PROVIDER_SITE_OTHER): Payer: Medicare HMO

## 2022-06-13 ENCOUNTER — Ambulatory Visit: Payer: Medicare HMO | Admitting: Orthopaedic Surgery

## 2022-06-13 ENCOUNTER — Encounter: Payer: Self-pay | Admitting: Orthopaedic Surgery

## 2022-06-13 DIAGNOSIS — Z96652 Presence of left artificial knee joint: Secondary | ICD-10-CM

## 2022-06-13 NOTE — Progress Notes (Signed)
The patient is a 66 year old gentleman that we replaced his left knee in January of this year.  He has remote history of a right knee replacement done in 2015 by one of my colleagues in town.  He is diabetic but has excellent blood glucose control with his last hemoglobin A1c being 5.3.  He does have chronic wounds on his legs and is in wound treatment now as it relates to a wound on his right leg.  He does ambulate with a cane.  He does state that both his knees are doing well.  He uses a cane only for balance.  He still has pain he says in other joints especially the shoulders.  His left more recent operative knee has full range of motion and is ligamentously stable.  2 views left knee show well-seated total knee arthroplasty with no complicating features.  From the standpoint of his knees he can follow-up as needed.  If he starts having worsening problems with his shoulders I will get him in with my partner Dr. Marlou Sa.  Follow-up is as needed for now.

## 2022-06-19 ENCOUNTER — Ambulatory Visit (INDEPENDENT_AMBULATORY_CARE_PROVIDER_SITE_OTHER): Payer: Medicare HMO

## 2022-06-19 DIAGNOSIS — I5022 Chronic systolic (congestive) heart failure: Secondary | ICD-10-CM

## 2022-06-19 DIAGNOSIS — Z95810 Presence of automatic (implantable) cardiac defibrillator: Secondary | ICD-10-CM | POA: Diagnosis not present

## 2022-06-21 ENCOUNTER — Encounter (HOSPITAL_BASED_OUTPATIENT_CLINIC_OR_DEPARTMENT_OTHER): Payer: Medicare HMO | Attending: Internal Medicine | Admitting: Internal Medicine

## 2022-06-21 DIAGNOSIS — L97812 Non-pressure chronic ulcer of other part of right lower leg with fat layer exposed: Secondary | ICD-10-CM | POA: Diagnosis not present

## 2022-06-21 DIAGNOSIS — E11622 Type 2 diabetes mellitus with other skin ulcer: Secondary | ICD-10-CM | POA: Insufficient documentation

## 2022-06-21 DIAGNOSIS — I87311 Chronic venous hypertension (idiopathic) with ulcer of right lower extremity: Secondary | ICD-10-CM | POA: Insufficient documentation

## 2022-06-21 DIAGNOSIS — I4819 Other persistent atrial fibrillation: Secondary | ICD-10-CM | POA: Insufficient documentation

## 2022-06-21 DIAGNOSIS — T798XXA Other early complications of trauma, initial encounter: Secondary | ICD-10-CM | POA: Insufficient documentation

## 2022-06-21 DIAGNOSIS — I110 Hypertensive heart disease with heart failure: Secondary | ICD-10-CM | POA: Diagnosis not present

## 2022-06-21 DIAGNOSIS — I428 Other cardiomyopathies: Secondary | ICD-10-CM | POA: Insufficient documentation

## 2022-06-21 DIAGNOSIS — I5022 Chronic systolic (congestive) heart failure: Secondary | ICD-10-CM | POA: Diagnosis not present

## 2022-06-21 DIAGNOSIS — W1789XA Other fall from one level to another, initial encounter: Secondary | ICD-10-CM | POA: Insufficient documentation

## 2022-06-21 DIAGNOSIS — J449 Chronic obstructive pulmonary disease, unspecified: Secondary | ICD-10-CM | POA: Diagnosis not present

## 2022-06-21 NOTE — Progress Notes (Signed)
Zachary, [name] ([id number]) Visit Report for 06/21/2022 Chief Complaint Document Details Patient Name: Date of Service: [name] RD L. 06/21/2022 2:45 PM Medical Record Number: [id number] Patient Account Number: [id number] Date of Birth/Sex: Treating RN: 11/14/1955 (66 y.o. M) Primary Care Provider: [name] Other Clinician: Referring Provider: Treating Provider/Extender: [name] in Treatment: 4 Information Obtained from: Patient Chief Complaint 05/24/2022; right lower extremity wound Electronic Signature(s) Signed: 06/21/2022 3:54:57 PM By: [name] Entered By: Kalman Shan on 06/21/2022 15:52:15 -------------------------------------------------------------------------------- Debridement Details Patient Name: Date of Service: [name] RD L. 06/21/2022 2:45 PM Medical Record Number: [id number] Patient Account Number: [id number] Date of Birth/Sex: Treating RN: 02/10/1956 (66 y.o. [name] Primary Care Provider: [name] Other Clinician: Referring Provider: Treating Provider/Extender: [name] in Treatment: 4 Debridement Performed for Assessment: Wound #3 Right,Medial Lower Leg Performed By: Physician Kalman Shan, DO Debridement Type: Debridement Severity of Tissue Pre Debridement: Fat layer exposed Level of Consciousness (Pre-procedure): Awake and Alert Pre-procedure Verification/Time Out Yes - 15:35 Taken: Start Time: 15:36 Pain Control: Lidocaine 5% topical ointment T Area Debrided (L x W): otal 1.2 (cm) x 1 (cm) = 1.2 (cm) Tissue and other material debrided: Non-Viable, Slough, Subcutaneous, Slough Level: Skin/Subcutaneous Tissue Debridement Description: Excisional Instrument: Curette Bleeding: Minimum Hemostasis Achieved: Pressure End Time: 15:40 Response to Treatment: Procedure was tolerated well Level of Consciousness (Post- Awake and Alert procedure): Post Debridement  Measurements of Total Wound Length: (cm) 1.2 Width: (cm) 1 Depth: (cm) 0.2 Volume: (cm) 0.188 Character of Wound/Ulcer Post Debridement: Stable Severity of Tissue Post Debridement: Fat layer exposed Post Procedure Diagnosis Same as Pre-procedure Electronic Signature(s) Signed: 06/21/2022 3:54:57 PM By: [name] Signed: 06/21/2022 4:31:37 PM By: [name] Entered By: [name] on 06/21/2022 15:49:01 -------------------------------------------------------------------------------- HPI Details Patient Name: Date of Service: [name] RD L. 06/21/2022 2:45 PM Medical Record Number: [id number] Patient Account Number: [id number] Date of Birth/Sex: Treating RN: 1955/10/30 (66 y.o. M) Primary Care Provider: [name] Other Clinician: Referring Provider: Treating Provider/Extender: [name] in Treatment: 4 History of Present Illness HPI Description: Admission 5/2 [name] is a 66 year old male with a past medical history of systolic congestive heart failure, COPD, nonischemic cardiomyopathy, type 2 diabetes, chronic venous insufficiency that presents to the clinic for a 34-monthhistory of nonhealing wound to his left lower extremity. He states that in November 2021 he fell off his porch and Hit his leg against a brick. The wound has healed mostly except for 1 area that is still open. He reports moderate Serosanguineous drainage daily. He has been keeping it covered with Band-Aids. He reports 2 rounds of antibiotics for this issue. He denies pain. He denies current acute signs of infection. 5/9; patient presents for 1 week follow-up. He has been using Hydrofera Blue under 3 layer compression. He has no issues or complaints today. He denies signs of infection. 5/16; patient presents for 1 week follow-up. He has been using Hydrofera Blue under 3 layer compression. He has no issues or complaints today. He denies signs of infection. He states he overall  feels well 5/23; patient presents for 1 week follow-up. He has been using Hydrofera Blue under 3 layer compression. He has no issues or complaints today. He denies signs of infection. 6/6; patient presents for 2-week follow-up. He has been using Hydrofera Blue under 3 layer compression. He has had no issues with this. He denies any signs of infection and  has no complaints today. Readmissiondifferent wound 6/27 patient developed another wound to his right lower extremity. He states that 1 week ago he had a large admission for a different wound Paining that was hung over his couch fell on his leg. He has been using alcohol and keeping the area covered. He denies signs of infection. 7/5; patient presents for 1 week follow-up. He has been tolerating Hydrofera Blue under 3 layer compression. He does report some pain to the wound site. He denies signs of infection. 7/11; patient presents for 1 week follow-up. He has been doing well with Hydrofera Blue under 3 layer compression. He reports improvement in pain since last clinic visit. He has no complaints today. He denies signs of infection. 7/18; patient presents for 1 week follow-up. Has been doing well with Hydrofera Blue under 3 layer compression. He denies signs of infection. Admission 05/24/2022 [name] is a 66 year old male with a past medical history of venous insufficiency that presents to the clinic for a 1 month history of nonhealing wound to his right lower extremity. He states that a wagon wheel hit it and caused a wound. He has been treated with Keflex for this issue By his podiatrist. He has been using silver alginate to the wound bed with dressing changes. He currently denies signs of infection. He is not wearing compression stockings. 8/15; patient presents for follow-up. We have been using antibiotic ointment with Hydrofera Blue under 3 layer compression. He tolerated the compression wrap well. He has no issues or complaints  today. He denies signs of infection. 8/22; patient presents for follow-up. We have been using Santyl and Hydrofera Blue under 3 layer compression. He has no issues or complaints today. 8/29; patient presents for follow-up. We have been using collagen to the wound bed under 3 layer compression. He has no issues or complaints today. 9/7; patient presents for follow-up. We have been using collagen under 3 layer compression. Patient has no issues or complaints today. Electronic Signature(s) Signed: 06/21/2022 3:54:57 PM By: [name] Entered By: Kalman Shan on 06/21/2022 15:52:32 -------------------------------------------------------------------------------- Physical Exam Details Patient Name: Date of Service: [name] RD L. 06/21/2022 2:45 PM Medical Record Number: [id number] Patient Account Number: [id number] Date of Birth/Sex: Treating RN: [birth year] (66 y.o. M) Primary Care Provider: [name] Other Clinician: Referring Provider: Treating Provider/Extender: [name] in Treatment: 4 Constitutional respirations regular, non-labored and within target range for patient.. Cardiovascular 2+ dorsalis pedis/posterior tibialis pulses. Psychiatric pleasant and cooperative. Notes : Right lower extremity: T the Proximal Medial aspect there is an open wound with granulation tissue and non viable tissue. Venous stasis dermatitis o Electronic Signature(s) Signed: 06/21/2022 3:54:57 PM By: [name] Entered By: Kalman Shan on 06/21/2022 15:53:05 -------------------------------------------------------------------------------- Physician Orders Details Patient Name: Date of Service: [name] RD L. 06/21/2022 2:45 PM Medical Record Number: [id number] Patient Account Number: [id number] Date of Birth/Sex: Treating RN: 12-05-1955 (66 y.o. [name] Primary Care Provider: [name] Other Clinician: Referring Provider: Treating  Provider/Extender: [name] in Treatment: 4 Verbal / Phone Orders: No Diagnosis Coding Follow-up Appointments ppointment in 1 week. - 06/26/22 @ 2:45pm with Dr. Heber Peap[place] and [place] and Leveda Anna, RN (Room 7) Return A Anesthetic (In clinic) Topical Lidocaine 5% applied to wound bed (In clinic) Topical Lidocaine 4% applied to wound bed Bathing/ Shower/ Hygiene May shower with protection but do not get wound dressing(s) wet. - May shower using cast protector bag. Edema Control - Lymphedema /  SCD / Other Elevate legs to the level of the heart or above for 30 minutes daily and/or when sitting, a frequency of: - several times a day Avoid standing for long periods of time. Additional Orders / Instructions Follow Nutritious Diet Wound Treatment Wound #3 - Lower Leg Wound Laterality: Right, Medial Cleanser: Soap and Water 1 x Per Week/30 Days Discharge Instructions: May shower and wash wound with dial antibacterial soap and water prior to dressing change. Cleanser: Wound Cleanser 1 x Per Week/30 Days Discharge Instructions: Cleanse the wound with wound cleanser prior to applying a clean dressing using gauze sponges, not tissue or cotton balls. Peri-Wound Care: Triamcinolone 15 (g) 1 x Per Week/30 Days Discharge Instructions: Use triamcinolone 15 (g) as directed Peri-Wound Care: Sween Lotion (Moisturizing lotion) 1 x Per Week/30 Days Discharge Instructions: Apply moisturizing lotion as directed Prim Dressing: Promogran Prisma Matrix, 4.34 (sq in) (silver collagen) 1 x Per Week/30 Days ary Discharge Instructions: Moisten collagen with saline or hydrogel Secondary Dressing: ABD Pad, 5x9 1 x Per Week/30 Days Discharge Instructions: Apply over primary dressing as directed. Compression Wrap: ThreePress (3 layer compression wrap) 1 x Per Week/30 Days Discharge Instructions: Apply three layer compression as directed. Electronic Signature(s) Signed: 06/21/2022 3:54:57 PM By: Kalman Shan  DO Entered By: Kalman Shan on 06/21/2022 15:53:14 -------------------------------------------------------------------------------- Problem List Details Patient Name: Date of Service: [name], [name] 06/21/2022 2:45 PM Medical Record Number: [id number] Patient Account Number: [id number] Date of Birth/Sex: Treating RN: 01-May-1956 (66 y.o. M) Primary Care Provider: [name] Other Clinician: Referring Provider: Treating Provider/Extender: [name] in Treatment: 4 Active Problems ICD-10 Encounter Code Description Active Date MDM Diagnosis I87.311 Chronic venous hypertension (idiopathic) with ulcer of right lower extremity 05/24/2022 No Yes L97.812 Non-pressure chronic ulcer of other part of right lower leg with fat layer 05/24/2022 No Yes exposed L97.67 Chronic systolic (congestive) heart failure 05/24/2022 No Yes I10 Essential (primary) hypertension 05/24/2022 No Yes I42.8 Other cardiomyopathies 05/24/2022 No Yes J44.9 Chronic obstructive pulmonary disease, unspecified 05/24/2022 No Yes I48.19 Other persistent atrial fibrillation 05/24/2022 No Yes E11.622 Type 2 diabetes mellitus with other skin ulcer 05/24/2022 No Yes T79.8XXA Other early complications of trauma, initial encounter 05/24/2022 No Yes Inactive Problems Resolved Problems Electronic Signature(s) Signed: 06/21/2022 3:54:57 PM By: [name] Entered By: Kalman Shan on 06/21/2022 15:52:05 -------------------------------------------------------------------------------- Progress Note Details Patient Name: Date of Service: [name] RD L. 06/21/2022 2:45 PM Medical Record Number: [id number] Patient Account Number: [id number] Date of Birth/Sex: Treating RN: [birth year] (66 y.o. M) Primary Care Provider: [name] Other Clinician: Referring Provider: Treating Provider/Extender: [name] in Treatment: 4 Subjective Chief Complaint Information obtained from  Patient 05/24/2022; right lower extremity wound History of Present Illness (HPI) Admission 5/2 Mr. Kunath is a 66 year old male with a past medical history of systolic congestive heart failure, COPD, nonischemic cardiomyopathy, type 2 diabetes, chronic venous insufficiency that presents to the clinic for a 51-monthhistory of nonhealing wound to his left lower extremity. He states that in November 2021 he fell off his porch and Hit his leg against a brick. The wound has healed mostly except for 1 area that is still open. He reports moderate Serosanguineous drainage daily. He has been keeping it covered with Band-Aids. He reports 2 rounds of antibiotics for this issue. He denies pain. He denies current acute signs of infection. 5/9; patient presents for 1 week follow-up. He has been using Hydrofera Blue under 3 layer compression. He  has no issues or complaints today. He denies signs of infection. 5/16; patient presents for 1 week follow-up. He has been using Hydrofera Blue under 3 layer compression. He has no issues or complaints today. He denies signs of infection. He states he overall feels well 5/23; patient presents for 1 week follow-up. He has been using Hydrofera Blue under 3 layer compression. He has no issues or complaints today. He denies signs of infection. 6/6; patient presents for 2-week follow-up. He has been using Hydrofera Blue under 3 layer compression. He has had no issues with this. He denies any signs of infection and has no complaints today. Readmissionoodifferent wound 6/27 patient developed another wound to his right lower extremity. He states that 1 week ago he had a large admission for a different wound Paining that was hung over his couch fell on his leg. He has been using alcohol and keeping the area covered. He denies signs of infection. 7/5; patient presents for 1 week follow-up. He has been tolerating Hydrofera Blue under 3 layer compression. He does report some pain to  the wound site. He denies signs of infection. 7/11; patient presents for 1 week follow-up. He has been doing well with Hydrofera Blue under 3 layer compression. He reports improvement in pain since last clinic visit. He has no complaints today. He denies signs of infection. 7/18; patient presents for 1 week follow-up. Has been doing well with Hydrofera Blue under 3 layer compression. He denies signs of infection. Admission 05/24/2022 Mr. Bao Bazen is a 66 year old male with a past medical history of venous insufficiency that presents to the clinic for a 1 month history of nonhealing wound to his right lower extremity. He states that a wagon wheel hit it and caused a wound. He has been treated with Keflex for this issue By his podiatrist. He has been using silver alginate to the wound bed with dressing changes. He currently denies signs of infection. He is not wearing compression stockings. 8/15; patient presents for follow-up. We have been using antibiotic ointment with Hydrofera Blue under 3 layer compression. He tolerated the compression wrap well. He has no issues or complaints today. He denies signs of infection. 8/22; patient presents for follow-up. We have been using Santyl and Hydrofera Blue under 3 layer compression. He has no issues or complaints today. 8/29; patient presents for follow-up. We have been using collagen to the wound bed under 3 layer compression. He has no issues or complaints today. 9/7; patient presents for follow-up. We have been using collagen under 3 layer compression. Patient has no issues or complaints today. Patient History Information obtained from Patient, Chart. Family History Heart Disease - Father, Hypertension - Mother,Father,Siblings, Lung Disease - Mother, Thyroid Problems - Mother, No family history of Cancer, Diabetes, Hereditary Spherocytosis, Kidney Disease, Seizures, Stroke, Tuberculosis. Social History Former smoker - quit 2009, Marital Status -  Divorced, Alcohol Use - Never, Drug Use - No History, Caffeine Use - Daily - coffee, diet soda. Medical History Eyes Patient has history of Cataracts, Glaucoma Denies history of Optic Neuritis Hematologic/Lymphatic Patient has history of Anemia Respiratory Patient has history of Asthma, Chronic Obstructive Pulmonary Disease (COPD), Sleep Apnea - sleeps in recliner Denies history of Pneumothorax Cardiovascular Patient has history of Arrhythmia - afib, Congestive Heart Failure, Deep Vein Thrombosis, Myocardial Infarction, Peripheral Venous Disease Endocrine Patient has history of Type II Diabetes Denies history of Type I Diabetes Genitourinary Denies history of End Stage Renal Disease Integumentary (Skin) Denies history of History of  Burn Musculoskeletal Patient has history of Osteoarthritis Oncologic Denies history of Received Chemotherapy, Received Radiation Psychiatric Denies history of Anorexia/bulimia, Confinement Anxiety Medical A Surgical History Notes nd Constitutional Symptoms (General Health) obesity Cardiovascular cardiomyopathy, pacemaker/defibulator Gastrointestinal GERD Endocrine thyrotoxicosis Genitourinary hx kidney stones, BPH Musculoskeletal thoracic and lumbosacral spondylosis Objective Constitutional respirations regular, non-labored and within target range for patient.. Vitals Time Taken: 3:25 PM, Height: 70 in, Weight: 253 lbs, BMI: 36.3, Temperature: 98.2 F, Pulse: 73 bpm, Respiratory Rate: 18 breaths/min, Blood Pressure: 101/68 mmHg. Cardiovascular 2+ dorsalis pedis/posterior tibialis pulses. Psychiatric pleasant and cooperative. General Notes: : Right lower extremity: T the Proximal Medial aspect there is an open wound with granulation tissue and non viable tissue. Venous stasis o dermatitis Integumentary (Hair, Skin) Wound #3 status is Open. Original cause of wound was Trauma. The date acquired was: 04/15/2022. The wound has been in  treatment 4 weeks. The wound is located on the Right,Medial Lower Leg. The wound measures 1.2cm length x 1cm width x 0.2cm depth; 0.942cm^2 area and 0.188cm^3 volume. There is Fat Layer (Subcutaneous Tissue) exposed. There is no tunneling or undermining noted. There is a medium amount of serosanguineous drainage noted. The wound margin is distinct with the outline attached to the wound base. There is large (67-100%) red granulation within the wound bed. There is a small (1-33%) amount of necrotic tissue within the wound bed including Adherent Slough. Assessment Active Problems ICD-10 Chronic venous hypertension (idiopathic) with ulcer of right lower extremity Non-pressure chronic ulcer of other part of right lower leg with fat layer exposed Chronic systolic (congestive) heart failure Essential (primary) hypertension Other cardiomyopathies Chronic obstructive pulmonary disease, unspecified Other persistent atrial fibrillation Type 2 diabetes mellitus with other skin ulcer Other early complications of trauma, initial encounter Patient's wound is stable. There is slightly more granulation tissue present helping fill the wound in. I debrided nonviable tissue. I recommended continuing the course with collagen under 3 layer compression. Follow-up in 1 week. Procedures Wound #3 Pre-procedure diagnosis of Wound #3 is a Venous Leg Ulcer located on the Right,Medial Lower Leg .Severity of Tissue Pre Debridement is: Fat layer exposed. There was a Excisional Skin/Subcutaneous Tissue Debridement with a total area of 1.2 sq cm performed by Kalman Shan, DO. With the following instrument(s): Curette to remove Non-Viable tissue/material. Material removed includes Subcutaneous Tissue and Slough and after achieving pain control using Lidocaine 5% topical ointment. No specimens were taken. A time out was conducted at 15:35, prior to the start of the procedure. A Minimum amount of bleeding was controlled  with Pressure. The procedure was tolerated well. Post Debridement Measurements: 1.2cm length x 1cm width x 0.2cm depth; 0.188cm^3 volume. Character of Wound/Ulcer Post Debridement is stable. Severity of Tissue Post Debridement is: Fat layer exposed. Post procedure Diagnosis Wound #3: Same as Pre-Procedure Pre-procedure diagnosis of Wound #3 is a Venous Leg Ulcer located on the Right,Medial Lower Leg . There was a Three Layer Compression Therapy Procedure by [name], RN. Post procedure Diagnosis Wound #3: Same as Pre-Procedure Plan Follow-up Appointments: Return Appointment in 1 week. - 06/26/22 @ 2:45pm with Dr. Heber Bovey and Leveda Anna, RN (Room 7) Anesthetic: (In clinic) Topical Lidocaine 5% applied to wound bed (In clinic) Topical Lidocaine 4% applied to wound bed Bathing/ Shower/ Hygiene: May shower with protection but do not get wound dressing(s) wet. - May shower using cast protector bag. Edema Control - Lymphedema / SCD / Other: Elevate legs to the level of the heart or above for 30 minutes daily  and/or when sitting, a frequency of: - several times a day Avoid standing for long periods of time. Additional Orders / Instructions: Follow Nutritious Diet WOUND #3: - Lower Leg Wound Laterality: Right, Medial Cleanser: Soap and Water 1 x Per Week/30 Days Discharge Instructions: May shower and wash wound with dial antibacterial soap and water prior to dressing change. Cleanser: Wound Cleanser 1 x Per Week/30 Days Discharge Instructions: Cleanse the wound with wound cleanser prior to applying a clean dressing using gauze sponges, not tissue or cotton balls. Peri-Wound Care: Triamcinolone 15 (g) 1 x Per Week/30 Days Discharge Instructions: Use triamcinolone 15 (g) as directed Peri-Wound Care: Sween Lotion (Moisturizing lotion) 1 x Per Week/30 Days Discharge Instructions: Apply moisturizing lotion as directed Prim Dressing: Promogran Prisma Matrix, 4.34 (sq in) (silver collagen) 1 x Per  Week/30 Days ary Discharge Instructions: Moisten collagen with saline or hydrogel Secondary Dressing: ABD Pad, 5x9 1 x Per Week/30 Days Discharge Instructions: Apply over primary dressing as directed. Com pression Wrap: ThreePress (3 layer compression wrap) 1 x Per Week/30 Days Discharge Instructions: Apply three layer compression as directed. 1. In office sharp debridement 2. Collagen under 3 layer compression 3. Follow-up in 1 week Electronic Signature(s) Signed: 06/21/2022 3:54:57 PM By: [name] Entered By: Kalman Shan on 06/21/2022 15:54:32 -------------------------------------------------------------------------------- HxROS Details Patient Name: Date of Service: [name], [name] 06/21/2022 2:45 PM Medical Record Number: [id number] Patient Account Number: [id number] Date of Birth/Sex: Treating RN: 1955/10/24 (66 y.o. M) Primary Care Provider: [name] Other Clinician: Referring Provider: Treating Provider/Extender: [name] in Treatment: 4 Information Obtained From Patient Chart Constitutional Symptoms (General Health) Medical History: Past Medical History Notes: obesity Eyes Medical History: Positive for: Cataracts; Glaucoma Negative for: Optic Neuritis Hematologic/Lymphatic Medical History: Positive for: Anemia Respiratory Medical History: Positive for: Asthma; Chronic Obstructive Pulmonary Disease (COPD); Sleep Apnea - sleeps in recliner Negative for: Pneumothorax Cardiovascular Medical History: Positive for: Arrhythmia - afib; Congestive Heart Failure; Deep Vein Thrombosis; Myocardial Infarction; Peripheral Venous Disease Past Medical History Notes: cardiomyopathy, pacemaker/defibulator Gastrointestinal Medical History: Past Medical History Notes: GERD Endocrine Medical History: Positive for: Type II Diabetes Negative for: Type I Diabetes Past Medical History Notes: thyrotoxicosis Time with diabetes: 10  yrs Treated with: Diet Blood sugar tested every day: No Genitourinary Medical History: Negative for: End Stage Renal Disease Past Medical History Notes: hx kidney stones, BPH Integumentary (Skin) Medical History: Negative for: History of Burn Musculoskeletal Medical History: Positive for: Osteoarthritis Past Medical History Notes: thoracic and lumbosacral spondylosis Oncologic Medical History: Negative for: Received Chemotherapy; Received Radiation Psychiatric Medical History: Negative for: Anorexia/bulimia; Confinement Anxiety HBO Extended History Items Eyes: Eyes: Cataracts Glaucoma Immunizations Pneumococcal Vaccine: Received Pneumococcal Vaccination: Yes Received Pneumococcal Vaccination On or After 60th Birthday: Yes Implantable Devices Yes Family and Social History Cancer: No; Diabetes: No; Heart Disease: Yes - Father; Hereditary Spherocytosis: No; Hypertension: Yes - Mother,Father,Siblings; Kidney Disease: No; Lung Disease: Yes - Mother; Seizures: No; Stroke: No; Thyroid Problems: Yes - Mother; Tuberculosis: No; Former smoker - quit 2009; Marital Status - Divorced; Alcohol Use: Never; Drug Use: No History; Caffeine Use: Daily - coffee, diet soda; Financial Concerns: No; Food, Clothing or Shelter Needs: No; Support System Lacking: No; Transportation Concerns: No Electronic Signature(s) Signed: 06/21/2022 3:54:57 PM By: [name] Entered By: Kalman Shan on 06/21/2022 15:52:38 -------------------------------------------------------------------------------- SuperBill Details Patient Name: Date of Service: [name] RD L. 06/21/2022 Medical Record Number: [id number] Patient Account Number: [id number] Date of Birth/Sex: Treating RN:  02-17-1956 (66 y.o. [name] Primary Care Provider: [name] Other Clinician: Referring Provider: Treating Provider/Extender: [name] in Treatment: 4 Diagnosis Coding ICD-10  Codes Code Description [phone] Chronic venous hypertension (idiopathic) with ulcer of right lower extremity L97.812 Non-pressure chronic ulcer of other part of right lower leg with fat layer exposed A70.14 Chronic systolic (congestive) heart failure I10 Essential (primary) hypertension I42.8 Other cardiomyopathies J44.9 Chronic obstructive pulmonary disease, unspecified I48.19 Other persistent atrial fibrillation E11.622 Type 2 diabetes mellitus with other skin ulcer T79.8XXA Other early complications of trauma, initial encounter Facility Procedures CPT4 Code: [id number] Description: 38887 - DEB SUBQ TISSUE 20 SQ CM/< ICD-10 Diagnosis Description N79.728 Non-pressure chronic ulcer of other part of right lower leg with fat layer exp Modifier: osed Quantity: 1 Physician Procedures : CPT4 Code Description Modifier [id number] - WC PHYS SUBQ TISS 20 SQ CM ICD-10 Diagnosis Description K32.761 Non-pressure chronic ulcer of other part of right lower leg with fat layer exposed Quantity: 1 Electronic Signature(s) Signed: 06/21/2022 3:54:57 PM By: [name] Entered By: Kalman Shan on 06/21/2022 15:54:40

## 2022-06-22 ENCOUNTER — Telehealth: Payer: Self-pay

## 2022-06-22 NOTE — Telephone Encounter (Signed)
Remote ICM transmission received.  Attempted call to patient regarding ICM remote transmission and left detailed message per DPR.  Advised to return call for any fluid symptoms or questions. Next ICM remote transmission scheduled 07/30/2022.

## 2022-06-22 NOTE — Progress Notes (Signed)
EPIC Encounter for ICM Monitoring  Patient Name: [name] is a 66 y.o. male Date: 06/22/2022 Primary Care Physican: [name], MD Primary Cardiologist: [name] Electrophysiologist: Vergie Living Pacing: 99.7%    11/22/2021 Weight: 244.5 lbs 04/10/2022 Weight: 245.8 lbs     Attempted call to patient and unable to reach.  Left detailed message per DPR regarding transmission. Transmission reviewed.    Optivol thoracic impedance normal but was suggesting intermittent days with possible fluid accumulation since 8/15.   Prescribed:  Torsemide 20 mg to 2 tablets (40 mg total) by mouth twice a day.   Potassium 20 mEq take 1 tablets (20 mEq total) daily   Labs: 03/30/2022 Creatinine 1.37, BUN 29, Potassium 44, Sodium 140 03/20/2022 Creatinine 1.51, BUN 36, Potassium 4.5, Sodium 135  12/20/2021 Creatinine 1.00, BUN 27, Potassium 4.3, Sodium 140, GFR 78.15 12/15/2021 Creatinine 1.05, BUN 23, Potassium 4.5, Sodium 137, GFR >60 A complete set of results can be found in Results Review.   Recommendations:  Left voice mail with ICM number and encouraged to call if experiencing any fluid symptoms.   Follow-up plan: ICM clinic phone appointment on 07/30/2022.   91 day device clinic remote transmission 07/25/2022.   EP/Cardiology Office Visits:   Recall 07/20/2022 with Dr [name].  Recall 10/28/2022 with Dr [name].      Copy of ICM check sent to Dr. [name].     3 month ICM trend: 06/19/2022.    12-14 Month ICM trend:     [name], RN 06/22/2022 10:46 AM

## 2022-06-22 NOTE — Progress Notes (Signed)
HAMP, MORELAND ([id number]) Visit Report for 06/21/2022 Arrival Information Details Patient Name: Date of Service: [name] RD L. 06/21/2022 2:45 PM Medical Record Number: [id number] Patient Account Number: [id number] Date of Birth/Sex: Treating RN: 1956/01/18 (66 y.o. M) Primary Care Tynan Boesel: [name] Other Clinician: Referring Jadeyn Hargett: Treating Edrian Melucci/Extender: [name] in Treatment: 4 Visit Information History Since Last Visit Added or deleted any medications: No Patient Arrived: [name] Any new allergies or adverse reactions: No Arrival Time: 15:23 Had a fall or experienced change in No Accompanied By: self activities of daily living that may affect Transfer Assistance: None risk of falls: Patient Identification Verified: Yes Signs or symptoms of abuse/neglect since last visito No Secondary Verification Process Completed: Yes Hospitalized since last visit: No Patient Requires Transmission-Based Precautions: No Implantable device outside of the clinic excluding No Patient Has Alerts: Yes cellular tissue based products placed in the center Patient Alerts: Patient on Blood Thinner since last visit: Pacemaker/Defibrillator Has Compression in Place as Prescribed: Yes Pain Present Now: No Electronic Signature(s) Signed: 06/22/2022 12:32:37 PM By: Erenest Blank Entered By: Erenest Blank on 06/21/2022 15:23:38 -------------------------------------------------------------------------------- Compression Therapy Details Patient Name: Date of Service: [name] RD L. 06/21/2022 2:45 PM Medical Record Number: [id number] Patient Account Number: [id number] Date of Birth/Sex: Treating RN: 11-16-1955 (66 y.o. [name] Primary Care Rachella Basden: [name] Other Clinician: Referring Gabriele Loveland: Treating Sheriann Newmann/Extender: [name] in Treatment: 4 Compression Therapy Performed for Wound Assessment: Wound #3 Right,Medial Lower  Leg Performed By: Clinician [name], RN Compression Type: Three Layer Post Procedure Diagnosis Same as Pre-procedure Electronic Signature(s) Signed: 06/21/2022 4:31:37 PM By: [name] Entered By: [name] on 06/21/2022 15:48:01 -------------------------------------------------------------------------------- Encounter Discharge Information Details Patient Name: Date of Service: [name], [name] RD L. 06/21/2022 2:45 PM Medical Record Number: [id number] Patient Account Number: [id number] Date of Birth/Sex: Treating RN: 1956/09/11 (66 y.o. [name] Primary Care [name]: [name] Other Clinician: Referring [name]: Treating [name]/Extender: [name] in Treatment: 4 Encounter Discharge Information Items Post Procedure Vitals Discharge Condition: Stable Temperature (F): 98.2 Ambulatory Status: Ambulatory Pulse (bpm): 73 Discharge Destination: Home Respiratory Rate (breaths/min): 18 Transportation: Private Auto Blood Pressure (mmHg): 101/68 Schedule Follow-up Appointment: Yes Clinical Summary of Care: Provided on 06/21/2022 Form Type Recipient Paper Patient Patient Electronic Signature(s) Signed: 06/21/2022 4:31:37 PM By: [name] Entered By: [name] on 06/21/2022 15:52:56 -------------------------------------------------------------------------------- Lower Extremity Assessment Details Patient Name: Date of Service: [name] RD L. 06/21/2022 2:45 PM Medical Record Number: [id number] Patient Account Number: [id number] Date of Birth/Sex: Treating RN: 05/27/1956 (66 y.o. [name] Primary Care Eilis Chestnutt: [name] Other Clinician: Referring Mayline Dragon: Treating Kemi Gell/Extender: [name] in Treatment: 4 Edema Assessment Assessed: [name]: No] [name]: Yes] Edema: [Left: Ye] [Right: s] Calf Left: Right: Point of Measurement: 36 cm From Medial Instep 35.8 cm Ankle Left:  Right: Point of Measurement: 12 cm From Medial Instep 20.7 cm Vascular Assessment Pulses: Dorsalis Pedis Palpable: [Right:Yes] Electronic Signature(s) Signed: 06/21/2022 4:31:37 PM By: [name] Entered By: [name] on 06/21/2022 15:38:26 -------------------------------------------------------------------------------- Multi Wound Chart Details Patient Name: Date of Service: [name] RD L. 06/21/2022 2:45 PM Medical Record Number: [id number] Patient Account Number: [id number] Date of Birth/Sex: Treating RN: 1956-05-01 (66 y.o. M) Primary Care Talana Slatten: [name] Other Clinician: Referring Richardo Popoff: Treating Rosealee Recinos/Extender: [name] in Treatment: 4 Vital Signs Height(in): 70 Pulse(bpm): 57 Weight(lbs): 224 Blood Pressure(mmHg): 101/68 Body Mass  Index(BMI): 36.3 Temperature(F): 98.2 Respiratory Rate(breaths/min): 18 Photos: [N/A:N/A] Right, Medial Lower Leg N/A N/A Wound Location: Trauma N/A N/A Wounding Event: Venous Leg Ulcer N/A N/A Primary Etiology: Cataracts, Glaucoma, Anemia, N/A N/A Comorbid History: Asthma, Chronic Obstructive Pulmonary Disease (COPD), Sleep Apnea, Arrhythmia, Congestive Heart Failure, Deep Vein Thrombosis, Myocardial Infarction, Peripheral Venous Disease, Type II Diabetes, Osteoarthritis 04/15/2022 N/A N/A Date Acquired: 4 N/A N/A Weeks of Treatment: Open N/A N/A Wound Status: No N/A N/A Wound Recurrence: 1.2x1x0.2 N/A N/A Measurements L x W x D (cm) 0.942 N/A N/A A (cm) : rea 0.188 N/A N/A Volume (cm) : 68.40% N/A N/A % Reduction in A rea: 68.50% N/A N/A % Reduction in Volume: Full Thickness Without Exposed N/A N/A Classification: Support Structures Medium N/A N/A Exudate A mount: Serosanguineous N/A N/A Exudate Type: red, brown N/A N/A Exudate Color: Distinct, outline attached N/A N/A Wound Margin: Large (67-100%) N/A N/A Granulation A mount: Red N/A N/A Granulation  Quality: Small (1-33%) N/A N/A Necrotic A mount: Fat Layer (Subcutaneous Tissue): Yes N/A N/A Exposed Structures: Fascia: No Tendon: No Muscle: No Joint: No Bone: No Medium (34-66%) N/A N/A Epithelialization: Debridement - Excisional N/A N/A Debridement: Pre-procedure Verification/Time Out 15:35 N/A N/A Taken: Lidocaine 5% topical ointment N/A N/A Pain Control: Subcutaneous, Slough N/A N/A Tissue Debrided: Skin/Subcutaneous Tissue N/A N/A Level: 1.2 N/A N/A Debridement A (sq cm): rea Curette N/A N/A Instrument: Minimum N/A N/A Bleeding: Pressure N/A N/A Hemostasis A chieved: Procedure was tolerated well N/A N/A Debridement Treatment Response: 1.2x1x0.2 N/A N/A Post Debridement Measurements L x W x D (cm) 0.188 N/A N/A Post Debridement Volume: (cm) Compression Therapy N/A N/A Procedures Performed: Debridement Treatment Notes Electronic Signature(s) Signed: 06/21/2022 3:54:57 PM By: [name] Entered By: Kalman Shan on 06/21/2022 15:52:09 -------------------------------------------------------------------------------- Multi-Disciplinary Care Plan Details Patient Name: Date of Service: [name] RD L. 06/21/2022 2:45 PM Medical Record Number: [id number] Patient Account Number: [id number] Date of Birth/Sex: Treating RN: [birth year] (66 y.o. [name] Primary Care Liese Dizdarevic: [name] Other Clinician: Referring Shamela Haydon: Treating Jaz Laningham/Extender: [name] in Treatment: 4 Active Inactive Wound/Skin Impairment Nursing Diagnoses: Impaired tissue integrity Goals: Patient/caregiver will verbalize understanding of skin care regimen Date Initiated: 05/24/2022 Target Resolution Date: 07/19/2022 Goal Status: Active Ulcer/skin breakdown will have a volume reduction of 30% by week 4 Date Initiated: 05/24/2022 Date Inactivated: 06/21/2022 Target Resolution Date: 06/21/2022 Goal Status: Met Ulcer/skin breakdown will have  a volume reduction of 50% by week 8 Date Initiated: 06/21/2022 Target Resolution Date: 07/19/2022 Goal Status: Active Interventions: Assess patient/caregiver ability to obtain necessary supplies Assess patient/caregiver ability to perform ulcer/skin care regimen upon admission and as needed Provide education on ulcer and skin care Treatment Activities: Topical wound management initiated : 05/24/2022 Notes: 06/21/22: Wound care regimen continues. Electronic Signature(s) Signed: 06/21/2022 4:31:37 PM By: [name] Entered By: [name] on 06/21/2022 15:32:23 -------------------------------------------------------------------------------- Pain Assessment Details Patient Name: Date of Service: [name] RD L. 06/21/2022 2:45 PM Medical Record Number: [id number] Patient Account Number: [id number] Date of Birth/Sex: Treating RN: 1956/07/14 (66 y.o. M) Primary Care Valeri Sula: [name] Other Clinician: Referring Harriett Azar: Treating Marylon Verno/Extender: [name] in Treatment: 4 Active Problems Location of Pain Severity and Description of Pain Patient Has Paino No Site Locations Pain Management and Medication Current Pain Management: Electronic Signature(s) Signed: 06/22/2022 12:32:37 PM By: Erenest Blank Entered By: Erenest Blank on 06/21/2022 15:29:29 -------------------------------------------------------------------------------- Patient/Caregiver Education Details Patient Name: Date of Service: [name], [place] RD L.  9/7/2023andnbsp2:45 PM Medical Record Number: [id number] Patient Account Number: [id number] Date of Birth/Gender: Treating RN: 06/06/1956 (66 y.o. [name] Primary Care Physician: [name] Other Clinician: Referring Physician: Treating Physician/Extender: [name] in Treatment: 4 Education Assessment Education Provided To: Patient Education Topics Provided Venous: Methods: Explain/Verbal,  Printed Responses: State content correctly Wound/Skin Impairment: Methods: Explain/Verbal, Printed Responses: State content correctly Electronic Signature(s) Signed: 06/21/2022 4:31:37 PM By: [name] Entered By: [name] on 06/21/2022 15:32:54 -------------------------------------------------------------------------------- Wound Assessment Details Patient Name: Date of Service: [name] RD L. 06/21/2022 2:45 PM Medical Record Number: [id number] Patient Account Number: [id number] Date of Birth/Sex: Treating RN: December 11, 1955 (66 y.o. [name] Primary Care Hadlyn Amero: [name] Other Clinician: Referring Ariston Grandison: Treating Darren Nodal/Extender: [name] in Treatment: 4 Wound Status Wound Number: 3 Primary Venous Leg Ulcer Etiology: Wound Location: Right, Medial Lower Leg Wound Open Wounding Event: Trauma Status: Date Acquired: 04/15/2022 Comorbid Cataracts, Glaucoma, Anemia, Asthma, Chronic Obstructive Weeks Of Treatment: 4 History: Pulmonary Disease (COPD), Sleep Apnea, Arrhythmia, Congestive Clustered Wound: No Heart Failure, Deep Vein Thrombosis, Myocardial Infarction, Peripheral Venous Disease, Type II Diabetes, Osteoarthritis Photos Wound Measurements Length: (cm) 1.2 Width: (cm) 1 Depth: (cm) 0.2 Area: (cm) 0.942 Volume: (cm) 0.188 % Reduction in Area: 68.4% % Reduction in Volume: 68.5% Epithelialization: Medium (34-66%) Tunneling: No Undermining: No Wound Description Classification: Full Thickness Without Exposed Support Structures Wound Margin: Distinct, outline attached Exudate Amount: Medium Exudate Type: Serosanguineous Exudate Color: red, brown Foul Odor After Cleansing: No Slough/Fibrino Yes Wound Bed Granulation Amount: Large (67-100%) Exposed Structure Granulation Quality: Red Fascia Exposed: No Necrotic Amount: Small (1-33%) Fat Layer (Subcutaneous Tissue) Exposed: Yes Necrotic Quality: Adherent  Slough Tendon Exposed: No Muscle Exposed: No Joint Exposed: No Bone Exposed: No Treatment Notes Wound #3 (Lower Leg) Wound Laterality: Right, Medial Cleanser Soap and Water Discharge Instruction: May shower and wash wound with dial antibacterial soap and water prior to dressing change. Wound Cleanser Discharge Instruction: Cleanse the wound with wound cleanser prior to applying a clean dressing using gauze sponges, not tissue or cotton balls. Peri-Wound Care Triamcinolone 15 (g) Discharge Instruction: Use triamcinolone 15 (g) as directed Sween Lotion (Moisturizing lotion) Discharge Instruction: Apply moisturizing lotion as directed Topical Primary Dressing Promogran Prisma Matrix, 4.34 (sq in) (silver collagen) Discharge Instruction: Moisten collagen with saline or hydrogel Secondary Dressing ABD Pad, 5x9 Discharge Instruction: Apply over primary dressing as directed. Secured With Compression Wrap ThreePress (3 layer compression wrap) Discharge Instruction: Apply three layer compression as directed. Compression Stockings Add-Ons Electronic Signature(s) Signed: 06/21/2022 4:31:37 PM By: [name] Signed: 06/22/2022 12:32:37 PM By: Erenest Blank Entered By: Erenest Blank on 06/21/2022 15:41:24 -------------------------------------------------------------------------------- Vitals Details Patient Name: Date of Service: [name], [name] RD L. 06/21/2022 2:45 PM Medical Record Number: [id number] Patient Account Number: [id number] Date of Birth/Sex: Treating RN: 1956-03-14 (66 y.o. M) Primary Care Jurney Overacker: [name] Other Clinician: Referring Tristyn Demarest: Treating Beckett Maden/Extender: [name] in Treatment: 4 Vital Signs Time Taken: 15:25 Temperature (F): 98.2 Height (in): 70 Pulse (bpm): 73 Weight (lbs): 253 Respiratory Rate (breaths/min): 18 Body Mass Index (BMI): 36.3 Blood Pressure (mmHg): 101/68 Reference Range: 80 - 120 mg / dl Electronic  Signature(s) Signed: 06/22/2022 12:32:37 PM By: Erenest Blank Entered By: Erenest Blank on 06/21/2022 15:29:13

## 2022-06-25 NOTE — Progress Notes (Unsigned)
[place] Phone: [phone] Subjective:   [name], am serving as a scribe for Dr. Hulan Saas.  I'm seeing this patient by the request  of:  [name], MD  CC: Bilateral shoulder pain  JME:[id number]  04/24/2022 Chronic problem with exacerbation.  Has had known arthropathy for quite some time of the shoulders.  Now seems to be having more pain in the decreased range of motion and on ultrasound did have some hypoechoic changes.  Discussed with patient about icing regimen, home exercises.  We will follow-up again in 8 to 10 weeks patient still wants to avoid surgical intervention  Bilateral injections given today, tolerated the procedure well, discussed with patient worsening symptoms again.  We will still need to consider the possibility of surgical intervention at some point.  Patient wants to continue with this at the moment.  Discussed the signs and symptoms of any type of infectious etiology.  Follow-up otherwise in 8 to 10 weeks.  Updated 06/26/2022 [name] is a 66 y.o. male coming in with complaint of bilateral shoulder pain. Pain in shoulders has increased. Having hard time moving arm overhead and picking up glass of water.        Past Medical History:  Diagnosis Date   AICD (automatic cardioverter/defibrillator) present    Anemia    supposed to be taking Vit B but doesn't   ANXIETY    takes Xanax nightly   Arthritis    Asthma    Albuterol prn and Advair daily;also takes Prednisone daily   Atrial fibrillation ([place]) 09/06/2015   Cardiomyopathy ([place])    a. EF 25% TEE July 2013; b. EF normalized 2015;  c. 03/2015 Echo: EF 40-45%, difrf HK, PASP 38 mmHg, Mild MR, sev LAE/RAE.   CHF (congestive heart failure) (HCC)    Chronic constipation    takes OTC stool softener   COPD (chronic obstructive pulmonary disease) (HCC)    "one dr says COPD; one dr says emphysema" (09/18/2017)    DEPRESSION    takes Zoloft and Doxepin daily   Diverticulitis    DYSKINESIA, ESOPHAGUS    Dysrhythmia    Atrial Fibrilation   Emphysema of lung [facility])    "one dr says COPD; one dr says emphysema" (09/18/2017)   Essential hypertension        FIBROMYALGIA    GERD (gastroesophageal reflux disease)        Glaucoma    HYPERLIPIDEMIA    a. Intolerant to statins.   INSOMNIA    takes Ambien nightly   Myocardial infarction [facility])    a. 2012 Myoview notable for prior infarct;  b. 03/2015 Lexiscan CL: EF 37%, diff HK, small area of inferior infarct from apex to base-->Med Rx.   Myocardial infarction ([place])    O2 dependent    "2.5L q hs & prn" (09/18/2017)   Paroxysmal atrial fibrillation ([place])    a. CHA2DS2VASc = 3--> takes Coumadin;  b. 03/15/2015 Successful TEE/DCCV;  c. 03/2015 recurrent afib, Amio d/c'd in setting of hyperthyroidism.   Peripheral neuropathy    Pneumonia 12/2016   Rash and other nonspecific skin eruption 04/12/2009   [name] cause found saw dermatologists x 2 and allergist   SLEEP APNEA, OBSTRUCTIVE    a. doesn't use CPAP   Syncope    a. 03/2015 s/p MDT LINQ.   Type II diabetes mellitus ([place])        Past Surgical History:  Procedure Laterality Date   ACNE CYST REMOVAL     2 on back    AV NODE ABLATION N/A 10/25/2017   Procedure: AV NODE ABLATION;  Surgeon: Deboraha Sprang, MD;  Location: [place] [facility];  Service: Cardiovascular;  Laterality: N/A;   BIV ICD INSERTION CRT-D N/A 09/18/2017   Procedure: BIV ICD INSERTION CRT-D;  Surgeon: Deboraha Sprang, MD;  Location: [place] [facility];  Service: Cardiovascular;  Laterality: N/A;   CARDIAC CATHETERIZATION N/A 03/21/2016   Procedure: Right/Left Heart Cath and Coronary Angiography;  Surgeon: Larey Dresser, MD;  Location: [place] [facility];  Service: Cardiovascular;  Laterality: N/A;   CARDIOVERSION  04/18/2012   Procedure: CARDIOVERSION;  Surgeon: Fay Records, MD;  Location: [place];  Service: Cardiovascular;  Laterality: N/A;    CARDIOVERSION  04/25/2012   Procedure: CARDIOVERSION;  Surgeon: Thayer Headings, MD;  Location: [place];  Service: Cardiovascular;  Laterality: N/A;   CARDIOVERSION  04/25/2012   Procedure: CARDIOVERSION;  Surgeon: Fay Records, MD;  Location: [place];  Service: Cardiovascular;  Laterality: N/A;   CARDIOVERSION  05/09/2012   Procedure: CARDIOVERSION;  Surgeon: Sherren Mocha, MD;  Location: [place];  Service: Cardiovascular;  Laterality: N/A;  changed from crenshaw to cooper by trish/leone-endo   CARDIOVERSION N/A 03/15/2015   Procedure: CARDIOVERSION;  Surgeon: Thayer Headings, MD;  Location: [place];  Service: Cardiovascular;  Laterality: N/A;   COLONOSCOPY     COLONOSCOPY WITH PROPOFOL N/A 10/21/2014   Procedure: COLONOSCOPY WITH PROPOFOL;  Surgeon: Ladene Artist, MD;  Location: [facility];  Service: Endoscopy;  Laterality: N/A;   EP IMPLANTABLE DEVICE N/A 04/06/2015   Procedure: Loop Recorder Insertion;  Surgeon: Evans Lance, MD;  Location: [place] [facility];  Service: Cardiovascular;  Laterality: N/A;   ESOPHAGOGASTRODUODENOSCOPY     JOINT REPLACEMENT     LOOP RECORDER REMOVAL N/A 09/18/2017   Procedure: LOOP RECORDER REMOVAL;  Surgeon: Deboraha Sprang, MD;  Location: [place] [facility];  Service: Cardiovascular;  Laterality: N/A;   RIGHT/LEFT HEART CATH AND CORONARY ANGIOGRAPHY N/A 01/28/2017   Procedure: Right/Left Heart Cath and Coronary Angiography;  Surgeon: Larey Dresser, MD;  Location: [place] [facility];  Service: Cardiovascular;  Laterality: N/A;   TEE WITHOUT CARDIOVERSION  04/25/2012   Procedure: TRANSESOPHAGEAL ECHOCARDIOGRAM (TEE);  Surgeon: Thayer Headings, MD;  Location: [place];  Service: Cardiovascular;  Laterality: N/A;   TEE WITHOUT CARDIOVERSION N/A 03/15/2015   Procedure: TRANSESOPHAGEAL ECHOCARDIOGRAM (TEE);  Surgeon: Thayer Headings, MD;  Location: [place];  Service: Cardiovascular;  Laterality: N/A;   TONSILLECTOMY AND ADENOIDECTOMY     TOTAL  KNEE ARTHROPLASTY Right 06/15/2014   Procedure: TOTAL KNEE ARTHROPLASTY;  Surgeon: Renette Butters, MD;  Location: [place];  Service: Orthopedics;  Laterality: Right;   TOTAL KNEE ARTHROPLASTY Left 10/17/2021   Procedure: Left TOTAL KNEE ARTHROPLASTY;  Surgeon: Mcarthur Rossetti, MD;  Location: [place];  Service: Orthopedics;  Laterality: Left;   Social History   Socioeconomic History   Marital status: Divorced    Spouse name: Not on file   Number of children: 2   Years of education: Not on file   Highest education level: Not on file  Occupational History   Occupation: retired/disabled. prev worked in Therapist, sports.    Employer: DISABLED  Tobacco Use   Smoking status: Former    Packs/day: 2.00    Years: 30.00    Total pack years: 60.00  Types: Cigarettes    Quit date: 10/16/2007    Years since quitting: 14.7   Smokeless tobacco: Never  Vaping Use   Vaping Use: Never used  Substance and Sexual Activity   Alcohol use: [name]   Drug use: [name]   Sexual activity: Not Currently  Other Topics Concern   Not on file  Social History Narrative   Lives alone.   Social Determinants of Health   Financial Resource Strain: Low Risk  (03/27/2021)   Overall Financial Resource Strain (CARDIA)    Difficulty of Paying Living Expenses: Not hard at all  Food Insecurity: [name] Food Insecurity (04/27/2022)   Hunger Vital Sign    Worried About Running Out of Food in the Last Year: Never true    Ran Out of Food in the Last Year: Never true  Transportation Needs: [name] Transportation Needs (04/27/2022)   PRAPARE - Hydrologist (Medical): [name]    Lack of Transportation (Non-Medical): [name]  Physical Activity: Inactive (03/27/2021)   Exercise Vital Sign    Days of Exercise per Week: 0 days    Minutes of Exercise per Session: 0 min  Stress: [name] Stress Concern Present (03/27/2021)   [place]    Feeling of Stress : Not  at all  Social Connections: Moderately Integrated (03/27/2021)   Social Connection and Isolation Panel [NHANES]    Frequency of Communication with Friends and Family: More than three times a week    Frequency of Social Gatherings with Friends and Family: More than three times a week    Attends Religious Services: More than 4 times per year    Active Member of [employer] or Organizations: Yes    Attends Music therapist: More than 4 times per year    Marital Status: Never married   Allergies  Allergen Reactions   Amiodarone Other (See Comments)    hyperthyroidism   Statins Other (See Comments)    myalgia   Tape Other (See Comments)    Skin Tears Use Paper Tape Only   Family History  Problem Relation Age of Onset   COPD Mother    Asthma Mother    Colon polyps Mother    Allergies Mother    Hypothyroidism Mother    Asthma Maternal Grandmother    Colon cancer Neg Hx     Current Outpatient Medications (Endocrine & Metabolic):    dapagliflozin propanediol (FARXIGA) 10 MG TABS tablet, Take 1 tablet (10 mg total) by mouth daily before breakfast.   predniSONE (DELTASONE) 20 MG tablet, Take 1 tablet (20 mg total) by mouth daily with breakfast.  Current Outpatient Medications (Cardiovascular):    carvedilol (COREG) 6.25 MG tablet, Take 1 tablet (6.25 mg total) by mouth 2 (two) times daily.   eplerenone (INSPRA) 50 MG tablet, Take 1 tablet (50 mg total) by mouth daily.   Evolocumab (REPATHA SURECLICK) 829 MG/ML SOAJ, INJECT 1 PEN INTO THE SKIN EVERY 14 (FOURTEEN) DAYS.   losartan (COZAAR) 25 MG tablet, Take 1 tablet (25 mg total) by mouth daily.   metolazone (ZAROXOLYN) 2.5 MG tablet, Take 1 tablet (2.5 mg total) by mouth as directed. With additional 40 meq of potassium   torsemide (DEMADEX) 20 MG tablet, Take 2 tablets by mouth 2 times daily.  Current Outpatient Medications (Respiratory):    albuterol (VENTOLIN HFA) 108 (90 Base) MCG/ACT inhaler, INHALE 2 PUFFS BY MOUTH 4 TIMES  A DAY AS NEEDED   fluticasone (  FLONASE) 50 MCG/ACT nasal spray, PLACE 2 SPRAYS INTO BOTH NOSTRILS DAILY AS NEEDED FOR ALLERGIES OR RHINITIS.  Current Outpatient Medications (Analgesics):    acetaminophen (TYLENOL) 500 MG tablet, Take 500 mg by mouth every 6 (six) hours as needed for moderate pain.   traMADol (ULTRAM) 50 MG tablet, TAKE 1 TABLET BY MOUTH EVERY DAY AS NEEDED  Current Outpatient Medications (Hematological):    apixaban (ELIQUIS) 5 MG TABS tablet, Take 1 tablet (5 mg total) by mouth 2 (two) times daily.  Current Outpatient Medications (Other):    ALPRAZolam (XANAX) 0.5 MG tablet, TAKE 2 TABLETS BY MOUTH AT BEDTIME   augmented betamethasone dipropionate (DIPROLENE-AF) 0.05 % cream, Apply 1 application topically daily as needed (itching).   carisoprodol (SOMA) 350 MG tablet, TAKE 1 TABLET BY MOUTH THREE TIMES A DAY AS NEEDED FOR MUSCLE SPASMS   cephALEXin (KEFLEX) 500 MG capsule, Take 1 capsule (500 mg total) by mouth 3 (three) times daily.   Cholecalciferol 100 MCG (4000 UT) CAPS, 1 tab by mouth once daily   doxepin (SINEQUAN) 10 MG capsule, TAKE 2 CAPSULES (20 MG TOTAL) BY MOUTH AT BEDTIME AS NEEDED.   DULoxetine (CYMBALTA) 60 MG capsule, Take 1 capsule (60 mg total) by mouth daily.   Dupilumab (DUPIXENT) 300 MG/2ML SOPN, Inject 300 mg into the skin every 14 (fourteen) days.   fluticasone (CUTIVATE) 0.05 % cream, SMARTSIG:Topical 1-2 Times Daily PRN   hydrOXYzine (ATARAX/VISTARIL) 10 MG tablet, TAKE 1 TABLET BY MOUTH 3 TIMES DAILY AS NEEDED FOR ITCHING.   ketoconazole (NIZORAL) 2 % cream, APPLY 1 APPLICATION TOPICALLY DAILY   lidocaine (LIDODERM) 5 %, APPLY 1 PATCH TO AFFECTED AREA FOR UP TO 12 HOURS. DO NOT USE MORE THAN ONE PATCH IN 24 HOURS.   omeprazole (PRILOSEC) 20 MG capsule, TAKE 1 CAPSULE BY MOUTH TWICE A DAY   Potassium Chloride (KLOR-CON PO), Take 20 mcg by mouth daily.   silver sulfADIAZINE (SILVADENE) 1 % cream, APPLY TOPICALLY TO AFFECTED AREA EVERY DAY    tamsulosin (FLOMAX) 0.4 MG CAPS capsule, TAKE 1 CAPSULE BY MOUTH EVERY DAY   traZODone (DESYREL) 100 MG tablet, TAKE 2 TABLETS BY MOUTH AT BEDTIME AS NEEDED   TUMS 500 MG chewable tablet, Chew 500-2,000 mg by mouth every 4 (four) hours as needed for indigestion or heartburn.    zolpidem (AMBIEN) 10 MG tablet, TAKE 1 TABLET BY MOUTH EVERY DAY AT BEDTIME AS NEEDED   Reviewed prior external information including notes and imaging from  primary care provider As well as notes that were available from care everywhere and other healthcare systems.  Past medical history, social, surgical and family history all reviewed in electronic medical record.  [name] pertanent information unless stated regarding to the chief complaint.   Review of Systems:  [name] headache, visual changes, nausea, vomiting, diarrhea, constipation, dizziness, abdominal pain, skin rash, fevers, chills, night sweats, weight loss, swollen lymph nodes, joint swelling, chest pain, shortness of breath, mood changes. POSITIVE muscle aches, body aches  Objective  Pulse 73, height '5\' 9"'$  (1.753 m), weight 253 lb (114.8 kg), SpO2 98 %.   General: [name] apparent distress alert and oriented x3 mood and affect normal, dressed appropriately.  HEENT: Pupils equal, extraocular movements intact  Respiratory: Patient's speak in full sentences and does not appear short of breath  Cardiovascular: [name] lower extremity edema, non tender, [name] erythema   Bilateral shoulders do have significant crepitus noted.  3 out of 5 strength of the rotator cuff.  Some limited range  of motion noted as well.  Procedure: Real-time Ultrasound Guided Injection of right glenohumeral joint Device: GE Logiq Q7  Ultrasound guided injection is preferred based studies that show increased duration, increased effect, greater accuracy, decreased procedural pain, increased response rate with ultrasound guided versus blind injection.  Verbal informed consent obtained.  Time-out conducted.   Noted [name] overlying erythema, induration, or other signs of local infection.  Skin prepped in a sterile fashion.  Local anesthesia: Topical Ethyl chloride.  With sterile technique and under real time ultrasound guidance:  Joint visualized.  23g 1  inch needle inserted posterior approach. Pictures taken for needle placement. Patient did have injection of 2 cc of 0.5% Marcaine, and 1.0 cc of Kenalog 40 mg/dL. Completed without difficulty  Pain immediately resolved suggesting accurate placement of the medication.  Advised to call if fevers/chills, erythema, induration, drainage, or persistent bleeding.  Impression: Technically successful ultrasound guided injection.  Procedure: Real-time Ultrasound Guided Injection of left glenohumeral joint Device: GE Logiq E  Ultrasound guided injection is preferred based studies that show increased duration, increased effect, greater accuracy, decreased procedural pain, increased response rate with ultrasound guided versus blind injection.  Verbal informed consent obtained.  Time-out conducted.  Noted [name] overlying erythema, induration, or other signs of local infection.  Skin prepped in a sterile fashion.  Local anesthesia: Topical Ethyl chloride.  With sterile technique and under real time ultrasound guidance:  Joint visualized.  21g 2 inch needle inserted posterior approach. Pictures taken for needle placement. Patient did have injection of 2 cc of 0.5% Marcaine, and 1cc of Kenalog 40 mg/dL. Completed without difficulty  Pain immediately resolved suggesting accurate placement of the medication.  Advised to call if fevers/chills, erythema, induration, drainage, or persistent bleeding.  Impression: Technically successful ultrasound guided injection.   Impression and Recommendations:    The above documentation has been reviewed and is accurate and complete Lyndal Pulley, DO

## 2022-06-26 ENCOUNTER — Encounter: Payer: Self-pay | Admitting: Family Medicine

## 2022-06-26 ENCOUNTER — Ambulatory Visit: Payer: Medicare HMO | Admitting: Family Medicine

## 2022-06-26 ENCOUNTER — Ambulatory Visit: Payer: Self-pay

## 2022-06-26 ENCOUNTER — Encounter (HOSPITAL_BASED_OUTPATIENT_CLINIC_OR_DEPARTMENT_OTHER): Payer: Medicare HMO | Admitting: Internal Medicine

## 2022-06-26 VITALS — HR 73 | Ht 69.0 in | Wt 253.0 lb

## 2022-06-26 DIAGNOSIS — L97812 Non-pressure chronic ulcer of other part of right lower leg with fat layer exposed: Secondary | ICD-10-CM | POA: Diagnosis not present

## 2022-06-26 DIAGNOSIS — I5022 Chronic systolic (congestive) heart failure: Secondary | ICD-10-CM

## 2022-06-26 DIAGNOSIS — M12811 Other specific arthropathies, not elsewhere classified, right shoulder: Secondary | ICD-10-CM

## 2022-06-26 DIAGNOSIS — M25511 Pain in right shoulder: Secondary | ICD-10-CM

## 2022-06-26 DIAGNOSIS — E11622 Type 2 diabetes mellitus with other skin ulcer: Secondary | ICD-10-CM | POA: Diagnosis not present

## 2022-06-26 DIAGNOSIS — M12812 Other specific arthropathies, not elsewhere classified, left shoulder: Secondary | ICD-10-CM

## 2022-06-26 DIAGNOSIS — I4819 Other persistent atrial fibrillation: Secondary | ICD-10-CM | POA: Diagnosis not present

## 2022-06-26 DIAGNOSIS — I87311 Chronic venous hypertension (idiopathic) with ulcer of right lower extremity: Secondary | ICD-10-CM

## 2022-06-26 DIAGNOSIS — G8929 Other chronic pain: Secondary | ICD-10-CM | POA: Diagnosis not present

## 2022-06-26 DIAGNOSIS — I110 Hypertensive heart disease with heart failure: Secondary | ICD-10-CM | POA: Diagnosis not present

## 2022-06-26 DIAGNOSIS — M25512 Pain in left shoulder: Secondary | ICD-10-CM

## 2022-06-26 DIAGNOSIS — I428 Other cardiomyopathies: Secondary | ICD-10-CM | POA: Diagnosis not present

## 2022-06-26 DIAGNOSIS — T798XXA Other early complications of trauma, initial encounter: Secondary | ICD-10-CM | POA: Diagnosis not present

## 2022-06-26 DIAGNOSIS — J449 Chronic obstructive pulmonary disease, unspecified: Secondary | ICD-10-CM | POA: Diagnosis not present

## 2022-06-26 MED ORDER — PREDNISONE 20 MG PO TABS: 20.0000 mg | ORAL_TABLET | Freq: Every day | ORAL | 0 refills | Status: DC

## 2022-06-26 NOTE — Progress Notes (Signed)
TYAIRE, ODEM ([id number]) Visit Report for 06/26/2022 Chief Complaint Document Details Patient Name: Date of Service: [name] RD L. 06/26/2022 2:45 PM Medical Record Number: [id number] Patient Account Number: [id number] Date of Birth/Sex: Treating RN: 06/30/1956 (66 y.o. M) Primary Care Provider: [name] Other Clinician: Referring Provider: Treating Provider/Extender: [name] in Treatment: 4 Information Obtained from: Patient Chief Complaint 05/24/2022; right lower extremity wound Electronic Signature(s) Signed: 06/26/2022 3:59:59 PM By: [name] Entered By: Kalman Shan on 06/26/2022 15:37:36 -------------------------------------------------------------------------------- HPI Details Patient Name: Date of Service: [name] RD L. 06/26/2022 2:45 PM Medical Record Number: [id number] Patient Account Number: [id number] Date of Birth/Sex: Treating RN: [birth year] (66 y.o. M) Primary Care Provider: [name] Other Clinician: Referring Provider: Treating Provider/Extender: [name] in Treatment: 4 History of Present Illness HPI Description: Admission 5/2 [name] is a 66 year old male with a past medical history of systolic congestive heart failure, COPD, nonischemic cardiomyopathy, type 2 diabetes, chronic venous insufficiency that presents to the clinic for a 58-monthhistory of nonhealing wound to his left lower extremity. He states that in November 2021 he fell off his porch and Hit his leg against a brick. The wound has healed mostly except for 1 area that is still open. He reports moderate Serosanguineous drainage daily. He has been keeping it covered with Band-Aids. He reports 2 rounds of antibiotics for this issue. He denies pain. He denies current acute signs of infection. 5/9; patient presents for 1 week follow-up. He has been using Hydrofera Blue under 3 layer compression. He has no issues or  complaints today. He denies signs of infection. 5/16; patient presents for 1 week follow-up. He has been using Hydrofera Blue under 3 layer compression. He has no issues or complaints today. He denies signs of infection. He states he overall feels well 5/23; patient presents for 1 week follow-up. He has been using Hydrofera Blue under 3 layer compression. He has no issues or complaints today. He denies signs of infection. 6/6; patient presents for 2-week follow-up. He has been using Hydrofera Blue under 3 layer compression. He has had no issues with this. He denies any signs of infection and has no complaints today. Readmissiondifferent wound 6/27 patient developed another wound to his right lower extremity. He states that 1 week ago he had a large admission for a different wound Paining that was hung over his couch fell on his leg. He has been using alcohol and keeping the area covered. He denies signs of infection. 7/5; patient presents for 1 week follow-up. He has been tolerating Hydrofera Blue under 3 layer compression. He does report some pain to the wound site. He denies signs of infection. 7/11; patient presents for 1 week follow-up. He has been doing well with Hydrofera Blue under 3 layer compression. He reports improvement in pain since last clinic visit. He has no complaints today. He denies signs of infection. 7/18; patient presents for 1 week follow-up. Has been doing well with Hydrofera Blue under 3 layer compression. He denies signs of infection. Admission 05/24/2022 [name] a 66year old male with a past medical history of venous insufficiency that presents to the clinic for a 1 month history of nonhealing wound to his right lower extremity. He states that a wagon wheel hit it and caused a wound. He has been treated with Keflex for this issue By his podiatrist. He has been using silver alginate to the wound bed with dressing changes.  He currently denies signs of  infection. He is not wearing compression stockings. 8/15; patient presents for follow-up. We have been using antibiotic ointment with Hydrofera Blue under 3 layer compression. He tolerated the compression wrap well. He has no issues or complaints today. He denies signs of infection. 8/22; patient presents for follow-up. We have been using Santyl and Hydrofera Blue under 3 layer compression. He has no issues or complaints today. 8/29; patient presents for follow-up. We have been using collagen to the wound bed under 3 layer compression. He has no issues or complaints today. 9/7; patient presents for follow-up. We have been using collagen under 3 layer compression. Patient has no issues or complaints today. 9/12; patient presents for follow-up. We have been using collagen under 3 layer compression. Patient has no issues or complaints today. Electronic Signature(s) Signed: 06/26/2022 3:59:59 PM By: [name] Entered By: Kalman Shan on 06/26/2022 15:37:55 -------------------------------------------------------------------------------- Physical Exam Details Patient Name: Date of Service: [name] RD L. 06/26/2022 2:45 PM Medical Record Number: [id number] Patient Account Number: [id number] Date of Birth/Sex: Treating RN: May 24, 1956 (66 y.o. M) Primary Care Provider: [name] Other Clinician: Referring Provider: Treating Provider/Extender: [name] in Treatment: 4 Constitutional respirations regular, non-labored and within target range for patient.. Cardiovascular 2+ dorsalis pedis/posterior tibialis pulses. Psychiatric pleasant and cooperative. Notes Right lower extremity: T the proximal medial aspect there is an open wound with granulation tissue present. Venous stasis dermatitis. Good edema control. o Electronic Signature(s) Signed: 06/26/2022 3:59:59 PM By: [name] Entered By: Kalman Shan on 06/26/2022  15:38:26 -------------------------------------------------------------------------------- Physician Orders Details Patient Name: Date of Service: [name] RD L. 06/26/2022 2:45 PM Medical Record Number: [id number] Patient Account Number: [id number] Date of Birth/Sex: Treating RN: 1956/08/25 (67 y.o. [name] Primary Care Provider: [name] Other Clinician: Referring Provider: Treating Provider/Extender: [name] in Treatment: 4 Verbal / Phone Orders: No Diagnosis Coding ICD-10 Coding Code Description I87.311 Chronic venous hypertension (idiopathic) with ulcer of right lower extremity L97.812 Non-pressure chronic ulcer of other part of right lower leg with fat layer exposed C14.48 Chronic systolic (congestive) heart failure I10 Essential (primary) hypertension I42.8 Other cardiomyopathies J44.9 Chronic obstructive pulmonary disease, unspecified I48.19 Other persistent atrial fibrillation E11.622 Type 2 diabetes mellitus with other skin ulcer T79.8XXA Other early complications of trauma, initial encounter Follow-up Appointments ppointment in 1 week. - with Dr. Heber Pl[place] and Leveda Anna, RN (Room 7) Return A Anesthetic (In clinic) Topical Lidocaine 5% applied to wound bed (In clinic) Topical Lidocaine 4% applied to wound bed Cellular or Tissue Based Products Other Cellular or Tissue Based Products Orders/Instructions: - IVR for [employer] May shower with protection but do not get wound dressing(s) wet. - May shower using cast protector bag. Edema Control - Lymphedema / SCD / Other Elevate legs to the level of the heart or above for 30 minutes daily and/or when sitting, a frequency of: - several times a day Avoid standing for long periods of time. Additional Orders / Instructions Follow Nutritious Diet Wound Treatment Wound #3 - Lower Leg Wound Laterality: Right, Medial Cleanser: Soap and Water 1 x Per Week/30 Days Discharge  Instructions: May shower and wash wound with dial antibacterial soap and water prior to dressing change. Cleanser: Wound Cleanser 1 x Per Week/30 Days Discharge Instructions: Cleanse the wound with wound cleanser prior to applying a clean dressing using gauze sponges, not tissue or cotton balls. Peri-Wound Care: Triamcinolone 15 (g) 1  x Per Week/30 Days Discharge Instructions: Use triamcinolone 15 (g) as directed Peri-Wound Care: Sween Lotion (Moisturizing lotion) 1 x Per Week/30 Days Discharge Instructions: Apply moisturizing lotion as directed Prim Dressing: Endoform 2x2 in 1 x Per Week/30 Days ary Discharge Instructions: Moisten with Hydrogel Secondary Dressing: ABD Pad, 5x9 1 x Per Week/30 Days Discharge Instructions: Apply over primary dressing as directed. Compression Wrap: ThreePress (3 layer compression wrap) 1 x Per Week/30 Days Discharge Instructions: Apply three layer compression as directed. Electronic Signature(s) Unsigned Previous Signature: 06/26/2022 3:59:59 PM Version By: [name] Entered By: [name] on 06/26/2022 16:05:58 -------------------------------------------------------------------------------- Problem List Details Patient Name: Date of Service: [name], [name] 06/26/2022 2:45 PM Medical Record Number: [id number] Patient Account Number: [id number] Date of Birth/Sex: Treating RN: 04-04-1956 (65 y.o. [name] Primary Care Provider: [name] Other Clinician: Referring Provider: Treating Provider/Extender: [name] in Treatment: 4 Active Problems ICD-10 Encounter Code Description Active Date MDM Diagnosis I87.311 Chronic venous hypertension (idiopathic) with ulcer of right lower extremity 05/24/2022 No Yes L97.812 Non-pressure chronic ulcer of other part of right lower leg with fat layer 05/24/2022 No Yes exposed H20.94 Chronic systolic (congestive) heart failure 05/24/2022 No Yes I10 Essential (primary)  hypertension 05/24/2022 No Yes I42.8 Other cardiomyopathies 05/24/2022 No Yes J44.9 Chronic obstructive pulmonary disease, unspecified 05/24/2022 No Yes I48.19 Other persistent atrial fibrillation 05/24/2022 No Yes E11.622 Type 2 diabetes mellitus with other skin ulcer 05/24/2022 No Yes T79.8XXA Other early complications of trauma, initial encounter 05/24/2022 No Yes Inactive Problems Resolved Problems Electronic Signature(s) Signed: 06/26/2022 3:59:59 PM By: [name] Entered By: Kalman Shan on 06/26/2022 15:37:27 -------------------------------------------------------------------------------- Progress Note Details Patient Name: Date of Service: [name] RD L. 06/26/2022 2:45 PM Medical Record Number: [id number] Patient Account Number: [id number] Date of Birth/Sex: Treating RN: 1956/09/16 (66 y.o. M) Primary Care Provider: [name] Other Clinician: Referring Provider: Treating Provider/Extender: [name] in Treatment: 4 Subjective Chief Complaint Information obtained from Patient 05/24/2022; right lower extremity wound History of Present Illness (HPI) Admission 5/2 Mr. Howton is a 66 year old male with a past medical history of systolic congestive heart failure, COPD, nonischemic cardiomyopathy, type 2 diabetes, chronic venous insufficiency that presents to the clinic for a 66-monthhistory of nonhealing wound to his left lower extremity. He states that in November 2021 he fell off his porch and Hit his leg against a brick. The wound has healed mostly except for 1 area that is still open. He reports moderate Serosanguineous drainage daily. He has been keeping it covered with Band-Aids. He reports 2 rounds of antibiotics for this issue. He denies pain. He denies current acute signs of infection. 5/9; patient presents for 1 week follow-up. He has been using Hydrofera Blue under 3 layer compression. He has no issues or complaints today. He  denies signs of infection. 5/16; patient presents for 1 week follow-up. He has been using Hydrofera Blue under 3 layer compression. He has no issues or complaints today. He denies signs of infection. He states he overall feels well 5/23; patient presents for 1 week follow-up. He has been using Hydrofera Blue under 3 layer compression. He has no issues or complaints today. He denies signs of infection. 6/6; patient presents for 2-week follow-up. He has been using Hydrofera Blue under 3 layer compression. He has had no issues with this. He denies any signs of infection and has no complaints today. Readmissionoodifferent wound 6/27 patient developed another wound to his  right lower extremity. He states that 1 week ago he had a large admission for a different wound Paining that was hung over his couch fell on his leg. He has been using alcohol and keeping the area covered. He denies signs of infection. 7/5; patient presents for 1 week follow-up. He has been tolerating Hydrofera Blue under 3 layer compression. He does report some pain to the wound site. He denies signs of infection. 7/11; patient presents for 1 week follow-up. He has been doing well with Hydrofera Blue under 3 layer compression. He reports improvement in pain since last clinic visit. He has no complaints today. He denies signs of infection. 7/18; patient presents for 1 week follow-up. Has been doing well with Hydrofera Blue under 3 layer compression. He denies signs of infection. Admission 05/24/2022 [name] is a 66 year old male with a past medical history of venous insufficiency that presents to the clinic for a 1 month history of nonhealing wound to his right lower extremity. He states that a wagon wheel hit it and caused a wound. He has been treated with Keflex for this issue By his podiatrist. He has been using silver alginate to the wound bed with dressing changes. He currently denies signs of infection. He is not  wearing compression stockings. 8/15; patient presents for follow-up. We have been using antibiotic ointment with Hydrofera Blue under 3 layer compression. He tolerated the compression wrap well. He has no issues or complaints today. He denies signs of infection. 8/22; patient presents for follow-up. We have been using Santyl and Hydrofera Blue under 3 layer compression. He has no issues or complaints today. 8/29; patient presents for follow-up. We have been using collagen to the wound bed under 3 layer compression. He has no issues or complaints today. 9/7; patient presents for follow-up. We have been using collagen under 3 layer compression. Patient has no issues or complaints today. 9/12; patient presents for follow-up. We have been using collagen under 3 layer compression. Patient has no issues or complaints today. Patient History Information obtained from Patient, Chart. Family History Heart Disease - Father, Hypertension - Mother,Father,Siblings, Lung Disease - Mother, Thyroid Problems - Mother, No family history of Cancer, Diabetes, Hereditary Spherocytosis, Kidney Disease, Seizures, Stroke, Tuberculosis. Social History Former smoker - quit 2009, Marital Status - Divorced, Alcohol Use - Never, Drug Use - No History, Caffeine Use - Daily - coffee, diet soda. Medical History Eyes Patient has history of Cataracts, Glaucoma Denies history of Optic Neuritis Hematologic/Lymphatic Patient has history of Anemia Respiratory Patient has history of Asthma, Chronic Obstructive Pulmonary Disease (COPD), Sleep Apnea - sleeps in recliner Denies history of Pneumothorax Cardiovascular Patient has history of Arrhythmia - afib, Congestive Heart Failure, Deep Vein Thrombosis, Myocardial Infarction, Peripheral Venous Disease Endocrine Patient has history of Type II Diabetes Denies history of Type I Diabetes Genitourinary Denies history of End Stage Renal Disease Integumentary (Skin) Denies history of  History of Burn Musculoskeletal Patient has history of Osteoarthritis Oncologic Denies history of Received Chemotherapy, Received Radiation Psychiatric Denies history of Anorexia/bulimia, Confinement Anxiety Medical A Surgical History Notes nd Constitutional Symptoms (General Health) obesity Cardiovascular cardiomyopathy, pacemaker/defibulator Gastrointestinal GERD Endocrine thyrotoxicosis Genitourinary hx kidney stones, BPH Musculoskeletal thoracic and lumbosacral spondylosis Objective Constitutional respirations regular, non-labored and within target range for patient.. Vitals Time Taken: 2:55 PM, Height: 70 in, Weight: 253 lbs, BMI: 36.3, Temperature: 98.2 F, Pulse: 76 bpm, Respiratory Rate: 18 breaths/min, Blood Pressure: 108/70 mmHg. Cardiovascular 2+ dorsalis pedis/posterior tibialis pulses. Psychiatric pleasant and  cooperative. General Notes: Right lower extremity: T the proximal medial aspect there is an open wound with granulation tissue present. Venous stasis dermatitis. Good o edema control. Integumentary (Hair, Skin) Wound #3 status is Open. Original cause of wound was Trauma. The date acquired was: 04/15/2022. The wound has been in treatment 4 weeks. The wound is located on the Right,Medial Lower Leg. The wound measures 1cm length x 1cm width x 0.2cm depth; 0.785cm^2 area and 0.157cm^3 volume. There is Fat Layer (Subcutaneous Tissue) exposed. There is no tunneling or undermining noted. There is a medium amount of serosanguineous drainage noted. The wound margin is distinct with the outline attached to the wound base. There is large (67-100%) red granulation within the wound bed. There is a small (1-33%) amount of necrotic tissue within the wound bed including Adherent Slough. Assessment Active Problems ICD-10 Chronic venous hypertension (idiopathic) with ulcer of right lower extremity Non-pressure chronic ulcer of other part of right lower leg with fat layer  exposed Chronic systolic (congestive) heart failure Essential (primary) hypertension Other cardiomyopathies Chronic obstructive pulmonary disease, unspecified Other persistent atrial fibrillation Type 2 diabetes mellitus with other skin ulcer Other early complications of trauma, initial encounter Patient's wound has shown slight improvement in size and appearance since last clinic visit. I recommended switching the dressing to endoform and continuing 3 layer compression. Follow-up in 1 week. Procedures Wound #3 Pre-procedure diagnosis of Wound #3 is a Venous Leg Ulcer located on the Right,Medial Lower Leg . There was a Three Layer Compression Therapy Procedure by [name], RN. Post procedure Diagnosis Wound #3: Same as Pre-Procedure Plan Follow-up Appointments: Return Appointment in 1 week. - with Dr. Heber [place] and Leveda Anna, RN (Room 7) Anesthetic: (In clinic) Topical Lidocaine 5% applied to wound bed (In clinic) Topical Lidocaine 4% applied to wound bed Cellular or Tissue Based Products: Other Cellular or Tissue Based Products Orders/Instructions: - IVR for [employer] Bathing/ Shower/ Hygiene: May shower with protection but do not get wound dressing(s) wet. - May shower using cast protector bag. Edema Control - Lymphedema / SCD / Other: Elevate legs to the level of the heart or above for 30 minutes daily and/or when sitting, a frequency of: - several times a day Avoid standing for long periods of time. Additional Orders / Instructions: Follow Nutritious Diet WOUND #3: - Lower Leg Wound Laterality: Right, Medial Cleanser: Soap and Water 1 x Per Week/30 Days Discharge Instructions: May shower and wash wound with dial antibacterial soap and water prior to dressing change. Cleanser: Wound Cleanser 1 x Per Week/30 Days Discharge Instructions: Cleanse the wound with wound cleanser prior to applying a clean dressing using gauze sponges, not tissue or cotton balls. Peri-Wound Care:  Triamcinolone 15 (g) 1 x Per Week/30 Days Discharge Instructions: Use triamcinolone 15 (g) as directed Peri-Wound Care: Sween Lotion (Moisturizing lotion) 1 x Per Week/30 Days Discharge Instructions: Apply moisturizing lotion as directed Prim Dressing: Endoform 2x2 in 1 x Per Week/30 Days ary Discharge Instructions: Moisten with Hydrogel Secondary Dressing: ABD Pad, 5x9 1 x Per Week/30 Days Discharge Instructions: Apply over primary dressing as directed. Com pression Wrap: ThreePress (3 layer compression wrap) 1 x Per Week/30 Days Discharge Instructions: Apply three layer compression as directed. 1. Endoform under 3 layer compression 2. Follow-up in 1 week Electronic Signature(s) Signed: 06/26/2022 3:59:59 PM By: [name] Entered By: Kalman Shan on 06/26/2022 15:39:24 -------------------------------------------------------------------------------- HxROS Details Patient Name: Date of Service: [name], [name] 06/26/2022 2:45 PM Medical Record Number: [id number] Patient  Account Number: [id number] Date of Birth/Sex: Treating RN: 04-Nov-1955 (66 y.o. M) Primary Care Provider: [name] Other Clinician: Referring Provider: Treating Provider/Extender: [name] in Treatment: 4 Information Obtained From Patient Chart Constitutional Symptoms (General Health) Medical History: Past Medical History Notes: obesity Eyes Medical History: Positive for: Cataracts; Glaucoma Negative for: Optic Neuritis Hematologic/Lymphatic Medical History: Positive for: Anemia Respiratory Medical History: Positive for: Asthma; Chronic Obstructive Pulmonary Disease (COPD); Sleep Apnea - sleeps in recliner Negative for: Pneumothorax Cardiovascular Medical History: Positive for: Arrhythmia - afib; Congestive Heart Failure; Deep Vein Thrombosis; Myocardial Infarction; Peripheral Venous Disease Past Medical History Notes: cardiomyopathy,  pacemaker/defibulator Gastrointestinal Medical History: Past Medical History Notes: GERD Endocrine Medical History: Positive for: Type II Diabetes Negative for: Type I Diabetes Past Medical History Notes: thyrotoxicosis Time with diabetes: 10 yrs Treated with: Diet Blood sugar tested every day: No Genitourinary Medical History: Negative for: End Stage Renal Disease Past Medical History Notes: hx kidney stones, BPH Integumentary (Skin) Medical History: Negative for: History of Burn Musculoskeletal Medical History: Positive for: Osteoarthritis Past Medical History Notes: thoracic and lumbosacral spondylosis Oncologic Medical History: Negative for: Received Chemotherapy; Received Radiation Psychiatric Medical History: Negative for: Anorexia/bulimia; Confinement Anxiety HBO Extended History Items Eyes: Eyes: Cataracts Glaucoma Immunizations Pneumococcal Vaccine: Received Pneumococcal Vaccination: Yes Received Pneumococcal Vaccination On or After 60th Birthday: Yes Implantable Devices Yes Family and Social History Cancer: No; Diabetes: No; Heart Disease: Yes - Father; Hereditary Spherocytosis: No; Hypertension: Yes - Mother,Father,Siblings; Kidney Disease: No; Lung Disease: Yes - Mother; Seizures: No; Stroke: No; Thyroid Problems: Yes - Mother; Tuberculosis: No; Former smoker - quit 2009; Marital Status - Divorced; Alcohol Use: Never; Drug Use: No History; Caffeine Use: Daily - coffee, diet soda; Financial Concerns: No; Food, Clothing or Shelter Needs: No; Support System Lacking: No; Transportation Concerns: No Electronic Signature(s) Signed: 06/26/2022 3:59:59 PM By: [name] Entered By: Kalman Shan on 06/26/2022 15:38:00 -------------------------------------------------------------------------------- SuperBill Details Patient Name: Date of Service: [name], [name] 06/26/2022 Medical Record Number: [id number] Patient Account Number: [id number] Date  of Birth/Sex: Treating RN: 02/13/1956 (66 y.o. [name] Primary Care Provider: [name] Other Clinician: Referring Provider: Treating Provider/Extender: [name] in Treatment: 4 Diagnosis Coding ICD-10 Codes Code Description [phone] Chronic venous hypertension (idiopathic) with ulcer of right lower extremity L97.812 Non-pressure chronic ulcer of other part of right lower leg with fat layer exposed P50.93 Chronic systolic (congestive) heart failure I10 Essential (primary) hypertension I42.8 Other cardiomyopathies J44.9 Chronic obstructive pulmonary disease, unspecified I48.19 Other persistent atrial fibrillation E11.622 Type 2 diabetes mellitus with other skin ulcer T79.8XXA Other early complications of trauma, initial encounter Facility Procedures CPT4 Code: [id number] Description: (Facility Use Only) [phone] - [facility] RT LEG ICD-10 Diagnosis Description S50.539 Non-pressure chronic ulcer of other part of right lower leg with fat layer exposed Modifier: Quantity: 1 Physician Procedures : CPT4 Code Description Modifier [id number] - WC PHYS LEVEL 3 - EST PT ICD-10 Diagnosis Description I87.311 Chronic venous hypertension (idiopathic) with ulcer of right lower extremity L97.812 Non-pressure chronic ulcer of other part of right lower  leg with fat layer exposed I09.73 Chronic systolic (congestive) heart failure E11.622 Type 2 diabetes mellitus with other skin ulcer Quantity: 1 Electronic Signature(s) Signed: 06/26/2022 3:59:59 PM By: [name] Entered By: Kalman Shan on 06/26/2022 15:39:43

## 2022-06-26 NOTE — Patient Instructions (Signed)
See me again in 8 weeks Injected both shoulders today Prednisone for 5 days

## 2022-06-26 NOTE — Assessment & Plan Note (Signed)
Chronic problem with worsening symptoms again.  Responded well to the injections with some mild improvement in range of motion almost immediately.  Discussed with patient about icing regimen and home exercises, which activities to do and which ones to avoid.  Patient will increase activity as tolerated.  Follow-up again in 6 to 8 weeks otherwise.

## 2022-06-26 NOTE — Progress Notes (Signed)
Zachary, [name] ([id number]) Visit Report for 06/26/2022 Arrival Information Details Patient Name: Date of Service: [name] RD L. 06/26/2022 2:45 PM Medical Record Number: [id number] Patient Account Number: [id number] Date of Birth/Sex: Treating RN: 10-Jun-1956 (66 y.o. [name] Primary Care Jaleeya Mcnelly: [name] Other Clinician: Referring Farhan Jean: Treating Dewaine Morocho/Extender: [name] in Treatment: 4 Visit Information History Since Last Visit Added or deleted any medications: No Patient Arrived: [name] Any new allergies or adverse reactions: No Arrival Time: 14:52 Had a fall or experienced change in No Transfer Assistance: None activities of daily living that may affect Patient Identification Verified: Yes risk of falls: Secondary Verification Process Completed: Yes Signs or symptoms of abuse/neglect since last visito No Patient Requires Transmission-Based Precautions: No Hospitalized since last visit: No Patient Has Alerts: Yes Implantable device outside of the clinic excluding No Patient Alerts: Patient on Blood Thinner cellular tissue based products placed in the center Pacemaker/Defibrillator since last visit: Has Dressing in Place as Prescribed: Yes Has Compression in Place as Prescribed: Yes Pain Present Now: No Electronic Signature(s) Signed: 06/26/2022 4:35:55 PM By: [name] Entered By: [name] on 06/26/2022 14:55:42 -------------------------------------------------------------------------------- Compression Therapy Details Patient Name: Date of Service: [name] RD L. 06/26/2022 2:45 PM Medical Record Number: [id number] Patient Account Number: [id number] Date of Birth/Sex: Treating RN: Aug 31, 1956 (66 y.o. [name] Primary Care Janson Lamar: [name] Other Clinician: Referring Nas Wafer: Treating Honey Zakarian/Extender: [name] in Treatment: 4 Compression Therapy Performed for Wound  Assessment: Wound #3 Right,Medial Lower Leg Performed By: Clinician [name], RN Compression Type: Three Layer Post Procedure Diagnosis Same as Pre-procedure Electronic Signature(s) Signed: 06/26/2022 4:35:55 PM By: [name] Entered By: [name] on 06/26/2022 15:21:21 -------------------------------------------------------------------------------- Encounter Discharge Information Details Patient Name: Date of Service: [name], Jearld [name] RD L. 06/26/2022 2:45 PM Medical Record Number: [id number] Patient Account Number: [id number] Date of Birth/Sex: Treating RN: October 17, 1955 (66 y.o. [name] Primary Care [name]: [name] Other Clinician: Referring [name]: Treating [name]/Extender: [name] in Treatment: 4 Encounter Discharge Information Items Discharge Condition: Stable Ambulatory Status: [place] Discharge Destination: Home Transportation: Private Auto Schedule Follow-up Appointment: Yes Clinical Summary of Care: Provided on 06/26/2022 Form Type Recipient Paper Patient Patient Electronic Signature(s) Signed: 06/26/2022 4:35:55 PM By: [name] Entered By: [name] on 06/26/2022 15:37:32 -------------------------------------------------------------------------------- Lower Extremity Assessment Details Patient Name: Date of Service: [name] RD L. 06/26/2022 2:45 PM Medical Record Number: [id number] Patient Account Number: [id number] Date of Birth/Sex: Treating RN: 1956/06/09 (66 y.o. [name] Primary Care Keerthana Vanrossum: [name] Other Clinician: Referring Taylorann Tkach: Treating [name]/Extender: [name] in Treatment: 4 Edema Assessment Assessed: [name]: No] [name]: Yes] Edema: [Left: Ye] [Right: s] Calf Left: Right: Point of Measurement: 36 cm From Medial Instep 31 cm Ankle Left: Right: Point of Measurement: 12 cm From Medial Instep 21 cm Vascular Assessment Pulses: Dorsalis  Pedis Palpable: [Right:Yes] Electronic Signature(s) Signed: 06/26/2022 4:35:55 PM By: [name] Entered By: [name] on 06/26/2022 15:05:12 -------------------------------------------------------------------------------- Multi Wound Chart Details Patient Name: Date of Service: [name] RD L. 06/26/2022 2:45 PM Medical Record Number: [id number] Patient Account Number: [id number] Date of Birth/Sex: Treating RN: [birth year] (66 y.o. M) Primary Care [name]: [name] Other Clinician: Referring [name]: Treating [name]/Extender: [name] in Treatment: 4 Vital Signs Height(in): 70 Pulse(bpm): 70 Weight(lbs): 253 Blood Pressure(mmHg): 108/70 Body Mass Index(BMI): 36.3 Temperature(F): 98.2 Respiratory Rate(breaths/min): 18 Photos: [N/A:N/A] Right, Medial  Lower Leg N/A N/A Wound Location: Trauma N/A N/A Wounding Event: Venous Leg Ulcer N/A N/A Primary Etiology: Cataracts, Glaucoma, Anemia, N/A N/A Comorbid History: Asthma, Chronic Obstructive Pulmonary Disease (COPD), Sleep Apnea, Arrhythmia, Congestive Heart Failure, Deep Vein Thrombosis, Myocardial Infarction, Peripheral Venous Disease, Type II Diabetes, Osteoarthritis 04/15/2022 N/A N/A Date Acquired: 4 N/A N/A Weeks of Treatment: Open N/A N/A Wound Status: No N/A N/A Wound Recurrence: 1x1x0.2 N/A N/A Measurements L x W x D (cm) 0.785 N/A N/A A (cm) : rea 0.157 N/A N/A Volume (cm) : 73.70% N/A N/A % Reduction in Area: 73.70% N/A N/A % Reduction in Volume: Full Thickness Without Exposed N/A N/A Classification: Support Structures Medium N/A N/A Exudate Amount: Serosanguineous N/A N/A Exudate Type: red, brown N/A N/A Exudate Color: Distinct, outline attached N/A N/A Wound Margin: Large (67-100%) N/A N/A Granulation Amount: Red N/A N/A Granulation Quality: Small (1-33%) N/A N/A Necrotic Amount: Fat Layer (Subcutaneous Tissue): Yes N/A N/A Exposed  Structures: Fascia: No Tendon: No Muscle: No Joint: No Bone: No Large (67-100%) N/A N/A Epithelialization: Compression Therapy N/A N/A Procedures Performed: Treatment Notes Wound #3 (Lower Leg) Wound Laterality: Right, Medial Cleanser Soap and Water Discharge Instruction: May shower and wash wound with dial antibacterial soap and water prior to dressing change. Wound Cleanser Discharge Instruction: Cleanse the wound with wound cleanser prior to applying a clean dressing using gauze sponges, not tissue or cotton balls. Peri-Wound Care Triamcinolone 15 (g) Discharge Instruction: Use triamcinolone 15 (g) as directed Sween Lotion (Moisturizing lotion) Discharge Instruction: Apply moisturizing lotion as directed Topical Primary Dressing Endoform 2x2 in Discharge Instruction: Moisten with Hydrogel Secondary Dressing ABD Pad, 5x9 Discharge Instruction: Apply over primary dressing as directed. Secured With Compression Wrap ThreePress (3 layer compression wrap) Discharge Instruction: Apply three layer compression as directed. Compression Stockings Add-Ons Electronic Signature(s) Signed: 06/26/2022 3:59:59 PM By: [name] Entered By: Kalman Shan on 06/26/2022 15:37:31 -------------------------------------------------------------------------------- Multi-Disciplinary Care Plan Details Patient Name: Date of Service: [name], Jearld [name] RD L. 06/26/2022 2:45 PM Medical Record Number: [id number] Patient Account Number: [id number] Date of Birth/Sex: Treating RN: 01-21-1956 (66 y.o. [name] Primary Care Trella Thurmond: [name] Other Clinician: Referring Kerensa Nicklas: Treating Bernadetta Roell/Extender: [name] in Treatment: 4 Active Inactive Wound/Skin Impairment Nursing Diagnoses: Impaired tissue integrity Goals: Patient/caregiver will verbalize understanding of skin care regimen Date Initiated: 05/24/2022 Target Resolution Date: 07/19/2022 Goal  Status: Active Ulcer/skin breakdown will have a volume reduction of 30% by week 4 Date Initiated: 05/24/2022 Date Inactivated: 06/21/2022 Target Resolution Date: 06/21/2022 Goal Status: Met Ulcer/skin breakdown will have a volume reduction of 50% by week 8 Date Initiated: 06/21/2022 Target Resolution Date: 07/19/2022 Goal Status: Active Interventions: Assess patient/caregiver ability to obtain necessary supplies Assess patient/caregiver ability to perform ulcer/skin care regimen upon admission and as needed Provide education on ulcer and skin care Treatment Activities: Topical wound management initiated : 05/24/2022 Notes: 06/21/22: Wound care regimen continues. Electronic Signature(s) Signed: 06/26/2022 4:35:55 PM By: [name] Entered By: [name] on 06/26/2022 14:52:19 -------------------------------------------------------------------------------- Pain Assessment Details Patient Name: Date of Service: [name] RD L. 06/26/2022 2:45 PM Medical Record Number: [id number] Patient Account Number: [id number] Date of Birth/Sex: Treating RN: [birth year] (66 y.o. [name] Primary Care Karsen Nakanishi: [name] Other Clinician: Referring Gwendlyn Hanback: Treating Lawernce Earll/Extender: [name] in Treatment: 4 Active Problems Location of Pain Severity and Description of Pain Patient Has Paino No Site Locations Pain Management and Medication Current Pain Management: Electronic Signature(s) Signed: 06/26/2022 4:35:55  PM By: Fara Chute By: [name] on 06/26/2022 14:58:36 -------------------------------------------------------------------------------- Patient/Caregiver Education Details Patient Name: Date of Service: [name] RD L. 9/12/2023andnbsp2:45 PM Medical Record Number: [id number] Patient Account Number: [id number] Date of Birth/Gender: Treating RN: 1956/07/08 (66 y.o. [name] Primary Care Physician: [name] Other  Clinician: Referring Physician: Treating Physician/Extender: [name] in Treatment: 4 Education Assessment Education Provided To: Patient Education Topics Provided Venous: Methods: Explain/Verbal, Printed Responses: State content correctly Wound/Skin Impairment: Methods: Explain/Verbal, Printed Responses: State content correctly Electronic Signature(s) Signed: 06/26/2022 4:35:55 PM By: [name] Entered By: [name] on 06/26/2022 14:52:37 -------------------------------------------------------------------------------- Wound Assessment Details Patient Name: Date of Service: [name] RD L. 06/26/2022 2:45 PM Medical Record Number: [id number] Patient Account Number: [id number] Date of Birth/Sex: Treating RN: 07/04/1956 (66 y.o. [name] Primary Care Kate Larock: [name] Other Clinician: Referring Yana Schorr: Treating Venezia Sargeant/Extender: [name] in Treatment: 4 Wound Status Wound Number: 3 Primary Venous Leg Ulcer Etiology: Wound Location: Right, Medial Lower Leg Wound Open Wounding Event: Trauma Status: Date Acquired: 04/15/2022 Comorbid Cataracts, Glaucoma, Anemia, Asthma, Chronic Obstructive Weeks Of Treatment: 4 History: Pulmonary Disease (COPD), Sleep Apnea, Arrhythmia, Congestive Clustered Wound: No Heart Failure, Deep Vein Thrombosis, Myocardial Infarction, Peripheral Venous Disease, Type II Diabetes, Osteoarthritis Photos Wound Measurements Length: (cm) 1 Width: (cm) 1 Depth: (cm) 0.2 Area: (cm) 0.785 Volume: (cm) 0.157 % Reduction in Area: 73.7% % Reduction in Volume: 73.7% Epithelialization: Large (67-100%) Tunneling: No Undermining: No Wound Description Classification: Full Thickness Without Exposed Support Struct Wound Margin: Distinct, outline attached Exudate Amount: Medium Exudate Type: Serosanguineous Exudate Color: red, brown Wound Bed Granulation Amount: Large  (67-100%) Granulation Quality: Red Necrotic Amount: Small (1-33%) Necrotic Quality: Adherent Slough ures Foul Odor After Cleansing: No Slough/Fibrino Yes Exposed Structure Fascia Exposed: No Fat Layer (Subcutaneous Tissue) Exposed: Yes Tendon Exposed: No Muscle Exposed: No Joint Exposed: No Bone Exposed: No Treatment Notes Wound #3 (Lower Leg) Wound Laterality: Right, Medial Cleanser Soap and Water Discharge Instruction: May shower and wash wound with dial antibacterial soap and water prior to dressing change. Wound Cleanser Discharge Instruction: Cleanse the wound with wound cleanser prior to applying a clean dressing using gauze sponges, not tissue or cotton balls. Peri-Wound Care Triamcinolone 15 (g) Discharge Instruction: Use triamcinolone 15 (g) as directed Sween Lotion (Moisturizing lotion) Discharge Instruction: Apply moisturizing lotion as directed Topical Primary Dressing Endoform 2x2 in Discharge Instruction: Moisten with Hydrogel Secondary Dressing ABD Pad, 5x9 Discharge Instruction: Apply over primary dressing as directed. Secured With Compression Wrap ThreePress (3 layer compression wrap) Discharge Instruction: Apply three layer compression as directed. Compression Stockings Add-Ons Electronic Signature(s) Signed: 06/26/2022 4:35:55 PM By: [name] Entered By: [name] on 06/26/2022 15:09:02 -------------------------------------------------------------------------------- Vitals Details Patient Name: Date of Service: [name], Jearld [name] RD L. 06/26/2022 2:45 PM Medical Record Number: [id number] Patient Account Number: [id number] Date of Birth/Sex: Treating RN: 1956/02/27 (67 y.o. [name] Primary Care Aleysia Oltmann: [name] Other Clinician: Referring Flynn Gwyn: Treating Aarib Pulido/Extender: [name] in Treatment: 4 Vital Signs Time Taken: 14:55 Temperature (F): 98.2 Height (in): 70 Pulse (bpm): 76 Weight (lbs):  253 Respiratory Rate (breaths/min): 18 Body Mass Index (BMI): 36.3 Blood Pressure (mmHg): 108/70 Reference Range: 80 - 120 mg / dl Electronic Signature(s) Signed: 06/26/2022 4:35:55 PM By: [name] Entered By: [name] on 06/26/2022 14:58:27

## 2022-06-27 ENCOUNTER — Ambulatory Visit: Payer: Medicare HMO | Admitting: Internal Medicine

## 2022-06-29 ENCOUNTER — Ambulatory Visit (INDEPENDENT_AMBULATORY_CARE_PROVIDER_SITE_OTHER): Payer: Medicare HMO

## 2022-06-29 ENCOUNTER — Ambulatory Visit (INDEPENDENT_AMBULATORY_CARE_PROVIDER_SITE_OTHER): Payer: Medicare HMO | Admitting: Internal Medicine

## 2022-06-29 VITALS — BP 120/78 | HR 74 | Temp 97.6°F | Ht 69.0 in | Wt 256.0 lb

## 2022-06-29 VITALS — BP 120/78 | HR 74 | Temp 97.6°F | Ht 69.0 in | Wt 256.4 lb

## 2022-06-29 DIAGNOSIS — F32A Depression, unspecified: Secondary | ICD-10-CM

## 2022-06-29 DIAGNOSIS — E7800 Pure hypercholesterolemia, unspecified: Secondary | ICD-10-CM

## 2022-06-29 DIAGNOSIS — F419 Anxiety disorder, unspecified: Secondary | ICD-10-CM | POA: Diagnosis not present

## 2022-06-29 DIAGNOSIS — Z23 Encounter for immunization: Secondary | ICD-10-CM | POA: Diagnosis not present

## 2022-06-29 DIAGNOSIS — E559 Vitamin D deficiency, unspecified: Secondary | ICD-10-CM

## 2022-06-29 DIAGNOSIS — Z0000 Encounter for general adult medical examination without abnormal findings: Secondary | ICD-10-CM | POA: Diagnosis not present

## 2022-06-29 DIAGNOSIS — E1165 Type 2 diabetes mellitus with hyperglycemia: Secondary | ICD-10-CM

## 2022-06-29 MED ORDER — VENLAFAXINE HCL ER 150 MG PO CP24: 150.0000 mg | ORAL_CAPSULE | Freq: Every day | ORAL | 3 refills | Status: DC

## 2022-06-29 NOTE — Progress Notes (Unsigned)
Patient ID: [name], male   DOB: 1956/03/11, 66 y.o.   MRN: [id number]        Chief Complaint: follow up HTN, HLD and hyperglycemia, worsening depression       HPI:  [name] is a 66 y.o. male here by himself with c/o mild worsening depression where current med does no longer working well, but denies SI or HI.  Pt denies chest pain, increased sob or doe, wheezing, orthopnea, PND, increased LE swelling, palpitations, dizziness or syncope.   Pt denies polydipsia, polyuria, or new focal neuro s/s.    Pt denies fever, wt loss, night sweats, loss of appetite, or other constitutional symptoms.  Due for flu shot       Wt Readings from Last 3 Encounters:  06/29/22 256 lb (116.1 kg)  06/29/22 256 lb 6.4 oz (116.3 kg)  06/26/22 253 lb (114.8 kg)   BP Readings from Last 3 Encounters:  06/29/22 120/78  06/29/22 120/78  05/17/22 110/70         Past Medical History:  Diagnosis Date   AICD (automatic cardioverter/defibrillator) present    Anemia    supposed to be taking Vit B but doesn't   ANXIETY    takes Xanax nightly   Arthritis    Asthma    Albuterol prn and Advair daily;also takes Prednisone daily   Atrial fibrillation ([place]) 09/06/2015   Cardiomyopathy ([place])    a. EF 25% TEE July 2013; b. EF normalized 2015;  c. 03/2015 Echo: EF 40-45%, difrf HK, PASP 38 mmHg, Mild MR, sev LAE/RAE.   CHF (congestive heart failure) (HCC)    Chronic constipation    takes OTC stool softener   COPD (chronic obstructive pulmonary disease) (HCC)    "one dr says COPD; one dr says emphysema" (09/18/2017)   DEPRESSION    takes Zoloft and Doxepin daily   Diverticulitis    DYSKINESIA, ESOPHAGUS    Dysrhythmia    Atrial Fibrilation   Emphysema of lung [facility])    "one dr says COPD; one dr says emphysema" (09/18/2017)   Essential hypertension        FIBROMYALGIA    GERD (gastroesophageal reflux disease)        Glaucoma    HYPERLIPIDEMIA    a. Intolerant to statins.   INSOMNIA    takes Ambien  nightly   Myocardial infarction [facility])    a. 2012 Myoview notable for prior infarct;  b. 03/2015 Lexiscan CL: EF 37%, diff HK, small area of inferior infarct from apex to base-->Med Rx.   Myocardial infarction ([place])    O2 dependent    "2.5L q hs & prn" (09/18/2017)   Paroxysmal atrial fibrillation ([place])    a. CHA2DS2VASc = 3--> takes Coumadin;  b. 03/15/2015 Successful TEE/DCCV;  c. 03/2015 recurrent afib, Amio d/c'd in setting of hyperthyroidism.   Peripheral neuropathy    Pneumonia 12/2016   Rash and other nonspecific skin eruption 04/12/2009   no cause found saw dermatologists x 2 and allergist   SLEEP APNEA, OBSTRUCTIVE    a. doesn't use CPAP   Syncope    a. 03/2015 s/p MDT LINQ.   Type II diabetes mellitus ([place])        Past Surgical History:  Procedure Laterality Date   ACNE CYST REMOVAL     2 on back    AV NODE ABLATION N/A 10/25/2017   Procedure: AV NODE ABLATION;  Surgeon: Deboraha Sprang, MD;  Location: [place] [facility];  Service: Cardiovascular;  Laterality: N/A;   BIV ICD INSERTION CRT-D N/A 09/18/2017   Procedure: BIV ICD INSERTION CRT-D;  Surgeon: Deboraha Sprang, MD;  Location: [place] [facility];  Service: Cardiovascular;  Laterality: N/A;   CARDIAC CATHETERIZATION N/A 03/21/2016   Procedure: Right/Left Heart Cath and Coronary Angiography;  Surgeon: Larey Dresser, MD;  Location: [place] [facility];  Service: Cardiovascular;  Laterality: N/A;   CARDIOVERSION  04/18/2012   Procedure: CARDIOVERSION;  Surgeon: Fay Records, MD;  Location: [place];  Service: Cardiovascular;  Laterality: N/A;   CARDIOVERSION  04/25/2012   Procedure: CARDIOVERSION;  Surgeon: Thayer Headings, MD;  Location: [place];  Service: Cardiovascular;  Laterality: N/A;   CARDIOVERSION  04/25/2012   Procedure: CARDIOVERSION;  Surgeon: Fay Records, MD;  Location: [place];  Service: Cardiovascular;  Laterality: N/A;   CARDIOVERSION  05/09/2012   Procedure: CARDIOVERSION;  Surgeon: Sherren Mocha, MD;  Location:  [place];  Service: Cardiovascular;  Laterality: N/A;  changed from crenshaw to cooper by trish/leone-endo   CARDIOVERSION N/A 03/15/2015   Procedure: CARDIOVERSION;  Surgeon: Thayer Headings, MD;  Location: [place];  Service: Cardiovascular;  Laterality: N/A;   COLONOSCOPY     COLONOSCOPY WITH PROPOFOL N/A 10/21/2014   Procedure: COLONOSCOPY WITH PROPOFOL;  Surgeon: Ladene Artist, MD;  Location: [facility];  Service: Endoscopy;  Laterality: N/A;   EP IMPLANTABLE DEVICE N/A 04/06/2015   Procedure: Loop Recorder Insertion;  Surgeon: Evans Lance, MD;  Location: [place] [facility];  Service: Cardiovascular;  Laterality: N/A;   ESOPHAGOGASTRODUODENOSCOPY     JOINT REPLACEMENT     LOOP RECORDER REMOVAL N/A 09/18/2017   Procedure: LOOP RECORDER REMOVAL;  Surgeon: Deboraha Sprang, MD;  Location: [place] [facility];  Service: Cardiovascular;  Laterality: N/A;   RIGHT/LEFT HEART CATH AND CORONARY ANGIOGRAPHY N/A 01/28/2017   Procedure: Right/Left Heart Cath and Coronary Angiography;  Surgeon: Larey Dresser, MD;  Location: [place] [facility];  Service: Cardiovascular;  Laterality: N/A;   TEE WITHOUT CARDIOVERSION  04/25/2012   Procedure: TRANSESOPHAGEAL ECHOCARDIOGRAM (TEE);  Surgeon: Thayer Headings, MD;  Location: [place];  Service: Cardiovascular;  Laterality: N/A;   TEE WITHOUT CARDIOVERSION N/A 03/15/2015   Procedure: TRANSESOPHAGEAL ECHOCARDIOGRAM (TEE);  Surgeon: Thayer Headings, MD;  Location: [place];  Service: Cardiovascular;  Laterality: N/A;   TONSILLECTOMY AND ADENOIDECTOMY     TOTAL KNEE ARTHROPLASTY Right 06/15/2014   Procedure: TOTAL KNEE ARTHROPLASTY;  Surgeon: Renette Butters, MD;  Location: [place];  Service: Orthopedics;  Laterality: Right;   TOTAL KNEE ARTHROPLASTY Left 10/17/2021   Procedure: Left TOTAL KNEE ARTHROPLASTY;  Surgeon: Mcarthur Rossetti, MD;  Location: [place];  Service: Orthopedics;  Laterality: Left;    reports that he quit smoking about 14 years ago.  His smoking use included cigarettes. He has a 60.00 pack-year smoking history. He has never used smokeless tobacco. He reports that he does not drink alcohol and does not use drugs. family history includes Allergies in his mother; Asthma in his maternal grandmother and mother; COPD in his mother; Colon polyps in his mother; Hypothyroidism in his mother. Allergies  Allergen Reactions   Amiodarone Other (See Comments)    hyperthyroidism   Statins Other (See Comments)    myalgia   Tape Other (See Comments)    Skin Tears Use Paper Tape Only   Current Outpatient Medications on File Prior to Visit  Medication Sig Dispense Refill   acetaminophen (  TYLENOL) 500 MG tablet Take 500 mg by mouth every 6 (six) hours as needed for moderate pain.     albuterol (VENTOLIN HFA) 108 (90 Base) MCG/ACT inhaler INHALE 2 PUFFS BY MOUTH 4 TIMES A DAY AS NEEDED 54 each 3   ALPRAZolam (XANAX) 0.5 MG tablet TAKE 2 TABLETS BY MOUTH AT BEDTIME 60 tablet 5   apixaban (ELIQUIS) 5 MG TABS tablet Take 1 tablet (5 mg total) by mouth 2 (two) times daily. 60 tablet 11   augmented betamethasone dipropionate (DIPROLENE-AF) 0.05 % cream Apply 1 application topically daily as needed (itching).     carisoprodol (SOMA) 350 MG tablet TAKE 1 TABLET BY MOUTH THREE TIMES A DAY AS NEEDED FOR MUSCLE SPASMS 90 tablet 2   carvedilol (COREG) 6.25 MG tablet Take 1 tablet (6.25 mg total) by mouth 2 (two) times daily. 60 tablet 11   cephALEXin (KEFLEX) 500 MG capsule Take 1 capsule (500 mg total) by mouth 3 (three) times daily. 30 capsule 0   Cholecalciferol 100 MCG (4000 UT) CAPS 1 tab by mouth once daily 30 capsule 99   dapagliflozin propanediol (FARXIGA) 10 MG TABS tablet Take 1 tablet (10 mg total) by mouth daily before breakfast. 90 tablet 3   doxepin (SINEQUAN) 10 MG capsule TAKE 2 CAPSULES (20 MG TOTAL) BY MOUTH AT BEDTIME AS NEEDED. 180 capsule 1   Dupilumab (DUPIXENT) 300 MG/2ML SOPN Inject 300 mg into the skin every 14 (fourteen) days.      eplerenone (INSPRA) 50 MG tablet Take 1 tablet (50 mg total) by mouth daily. 90 tablet 3   Evolocumab (REPATHA SURECLICK) 921 MG/ML SOAJ INJECT 1 PEN INTO THE SKIN EVERY 14 (FOURTEEN) DAYS. 6 mL 3   fluticasone (CUTIVATE) 0.05 % cream SMARTSIG:Topical 1-2 Times Daily PRN     fluticasone (FLONASE) 50 MCG/ACT nasal spray PLACE 2 SPRAYS INTO BOTH NOSTRILS DAILY AS NEEDED FOR ALLERGIES OR RHINITIS. 48 mL 1   hydrOXYzine (ATARAX/VISTARIL) 10 MG tablet TAKE 1 TABLET BY MOUTH 3 TIMES DAILY AS NEEDED FOR ITCHING. 60 tablet 2   ketoconazole (NIZORAL) 2 % cream APPLY 1 APPLICATION TOPICALLY DAILY 30 g 1   lidocaine (LIDODERM) 5 % APPLY 1 PATCH TO AFFECTED AREA FOR UP TO 12 HOURS. DO NOT USE MORE THAN ONE PATCH IN 24 HOURS. 90 patch 2   losartan (COZAAR) 25 MG tablet Take 1 tablet (25 mg total) by mouth daily. 30 tablet 11   metolazone (ZAROXOLYN) 2.5 MG tablet Take 1 tablet (2.5 mg total) by mouth as directed. With additional 40 meq of potassium 5 tablet 0   omeprazole (PRILOSEC) 20 MG capsule TAKE 1 CAPSULE BY MOUTH TWICE A DAY 180 capsule 1   Potassium Chloride (KLOR-CON PO) Take 20 mcg by mouth daily.     predniSONE (DELTASONE) 20 MG tablet Take 1 tablet (20 mg total) by mouth daily with breakfast. 5 tablet 0   silver sulfADIAZINE (SILVADENE) 1 % cream APPLY TOPICALLY TO AFFECTED AREA EVERY DAY 50 g 3   tamsulosin (FLOMAX) 0.4 MG CAPS capsule TAKE 1 CAPSULE BY MOUTH EVERY DAY 90 capsule 3   torsemide (DEMADEX) 20 MG tablet Take 2 tablets by mouth 2 times daily. 60 tablet 3   traMADol (ULTRAM) 50 MG tablet TAKE 1 TABLET BY MOUTH EVERY DAY AS NEEDED 30 tablet 2   traZODone (DESYREL) 100 MG tablet TAKE 2 TABLETS BY MOUTH AT BEDTIME AS NEEDED 180 tablet 1   TUMS 500 MG chewable tablet Chew 500-2,000 mg by mouth  every 4 (four) hours as needed for indigestion or heartburn.      zolpidem (AMBIEN) 10 MG tablet TAKE 1 TABLET BY MOUTH EVERY DAY AT BEDTIME AS NEEDED 90 tablet 1   KLOR-CON M20 20 MEQ tablet Take  20 mEq by mouth daily.     No current facility-administered medications on file prior to visit.        ROS:  All others reviewed and negative.  Objective        PE:  BP 120/78 (BP Location: Right Arm, Patient Position: Sitting, Cuff Size: Large)   Pulse 74   Temp 97.6 F (36.4 C) (Oral)   Ht '5\' 9"'$  (1.753 m)   Wt 256 lb (116.1 kg)   SpO2 97%   BMI 37.80 kg/m                 Constitutional: Pt appears in NAD               HENT: Head: NCAT.                Right Ear: External ear normal.                 Left Ear: External ear normal.                Eyes: . Pupils are equal, round, and reactive to light. Conjunctivae and EOM are normal               Nose: without d/c or deformity               Neck: Neck supple. Gross normal ROM               Cardiovascular: Normal rate and regular rhythm.                 Pulmonary/Chest: Effort normal and breath sounds without rales or wheezing.                Abd:  Soft, NT, ND, + BS, no organomegaly               Neurological: Pt is alert. At baseline orientation, motor grossly intact               Skin: Skin is warm. No rashes, no other new lesions, LE edema - none               Psychiatric: Pt behavior is normal without agitation ,  marked depressive affect  Micro: none  Cardiac tracings I have personally interpreted today:  none  Pertinent Radiological findings (summarize): none   Lab Results  Component Value Date   WBC 7.1 03/20/2022   HGB 13.4 03/20/2022   HCT 40.4 03/20/2022   PLT 188 03/20/2022   GLUCOSE 89 03/30/2022   CHOL 99 12/20/2021   TRIG 132.0 12/20/2021   HDL 45.20 12/20/2021   LDLDIRECT 72.0 02/28/2021   LDLCALC 28 12/20/2021   ALT 15 12/20/2021   AST 17 12/20/2021   NA 140 03/30/2022   K 4.4 03/30/2022   CL 107 03/30/2022   CREATININE 1.37 (H) 03/30/2022   BUN 29 (H) 03/30/2022   CO2 25 03/30/2022   TSH 1.09 12/20/2021   PSA 2.62 12/20/2021   INR 1.1 05/08/2021   HGBA1C 5.4 12/20/2021   MICROALBUR <0.7  12/20/2021   Assessment/Plan:  [name] is a 66 y.o. White or Caucasian [1] male with  has a past medical history of AICD (automatic  cardioverter/defibrillator) present, Anemia, ANXIETY, Arthritis, Asthma, Atrial fibrillation ([place]) (09/06/2015), Cardiomyopathy ([place]), CHF (congestive heart failure) ([place]), Chronic constipation, COPD (chronic obstructive pulmonary disease) ([place]), DEPRESSION, Diverticulitis, DYSKINESIA, ESOPHAGUS, Dysrhythmia, Emphysema of lung ([place]), Essential hypertension, FIBROMYALGIA, GERD (gastroesophageal reflux disease), Glaucoma, HYPERLIPIDEMIA, INSOMNIA, Myocardial infarction [facility]), Myocardial infarction ([place]), O2 dependent, Paroxysmal atrial fibrillation ([place]), Peripheral neuropathy, Pneumonia (12/2016), Rash and other nonspecific skin eruption (04/12/2009), SLEEP APNEA, OBSTRUCTIVE, Syncope, and Type II diabetes mellitus ([place]).  Anxiety and depression With moderate to severe worseing depression, no SI or HI, ok for increased effexor 150 mg qd, declines need for referral psychiatry or counseling at this time  Hyperlipidemia Lab Results  Component Value Date   LDLCALC 28 12/20/2021   Stable, pt to continue current low chol diet, delcines statin   Type 2 diabetes mellitus ([place]) Lab Results  Component Value Date   HGBA1C 5.4 12/20/2021   Stable, pt to continue current medical treatment farxiga 10 mg qd, declines a1c f/u today   Vitamin D deficiency Last vitamin D Lab Results  Component Value Date   VD25OH 34.25 12/20/2021   Low, reminded to start oral replacement  Followup: Return in about 6 months (around 12/28/2022).  [name], MD 07/01/2022 8:15 PM [facility]

## 2022-06-29 NOTE — Patient Instructions (Signed)
[name] , Thank you for taking time to come for your Medicare Wellness Visit. I appreciate your ongoing commitment to your health goals. Please review the following plan we discussed and let me know if I can assist you in the future.   Screening recommendations/referrals: Colonoscopy: Last done 03/04/2015; due every 5 years (overdue) Recommended yearly ophthalmology/optometry visit for glaucoma screening and checkup Recommended yearly dental visit for hygiene and checkup  Vaccinations: Influenza vaccine: 06/29/2022 Pneumococcal vaccine: 04/29/2017, 03/17/2021 Tdap vaccine: 02/23/2015; due every 10 years Shingles vaccine: declined   Covid-19: 03/21/2020, 04/11/2020, 11/03/2020  Advanced directives: Yes  Conditions/risks identified: Yes  Next appointment: Follow up in one year for your annual wellness visit.   Preventive Care 67 Years and Older, Male  Preventive care refers to lifestyle choices and visits with your health care provider that can promote health and wellness. What does preventive care include? A yearly physical exam. This is also called an annual well check. Dental exams once or twice a year. Routine eye exams. Ask your health care provider how often you should have your eyes checked. Personal lifestyle choices, including: Daily care of your teeth and gums. Regular physical activity. Eating a healthy diet. Avoiding tobacco and drug use. Limiting alcohol use. Practicing safe sex. Taking low doses of aspirin every day. Taking vitamin and mineral supplements as recommended by your health care provider. What happens during an annual well check? The services and screenings done by your health care provider during your annual well check will depend on your age, overall health, lifestyle risk factors, and family history of disease. Counseling  Your health care provider may ask you questions about your: Alcohol use. Tobacco use. Drug use. Emotional well-being. Home and  relationship well-being. Sexual activity. Eating habits. History of falls. Memory and ability to understand (cognition). Work and work Statistician. Screening  You may have the following tests or measurements: Height, weight, and BMI. Blood pressure. Lipid and cholesterol levels. These may be checked every 5 years, or more frequently if you are over 79 years old. Skin check. Lung cancer screening. You may have this screening every year starting at age 23 if you have a 30-pack-year history of smoking and currently smoke or have quit within the past 15 years. Fecal occult blood test (FOBT) of the stool. You may have this test every year starting at age 37. Flexible sigmoidoscopy or colonoscopy. You may have a sigmoidoscopy every 5 years or a colonoscopy every 10 years starting at age 57. Prostate cancer screening. Recommendations will vary depending on your family history and other risks. Hepatitis C blood test. Hepatitis B blood test. Sexually transmitted disease (STD) testing. Diabetes screening. This is done by checking your blood sugar (glucose) after you have not eaten for a while (fasting). You may have this done every 1-3 years. Abdominal aortic aneurysm (AAA) screening. You may need this if you are a current or former smoker. Osteoporosis. You may be screened starting at age 53 if you are at high risk. Talk with your health care provider about your test results, treatment options, and if necessary, the need for more tests. Vaccines  Your health care provider may recommend certain vaccines, such as: Influenza vaccine. This is recommended every year. Tetanus, diphtheria, and acellular pertussis (Tdap, Td) vaccine. You may need a Td booster every 10 years. Zoster vaccine. You may need this after age 76. Pneumococcal 13-valent conjugate (PCV13) vaccine. One dose is recommended after age 22. Pneumococcal polysaccharide (PPSV23) vaccine. One dose is recommended  after age 32. Talk to your  health care provider about which screenings and vaccines you need and how often you need them. This information is not intended to replace advice given to you by your health care provider. Make sure you discuss any questions you have with your health care provider. Document Released: 10/28/2015 Document Revised: 06/20/2016 Document Reviewed: 08/02/2015 Elsevier Interactive Patient Education  2017 [place] Prevention in the Home Falls can cause injuries. They can happen to people of all ages. There are many things you can do to make your home safe and to help prevent falls. What can I do on the outside of my home? Regularly fix the edges of walkways and driveways and fix any cracks. Remove anything that might make you trip as you walk through a door, such as a raised step or threshold. Trim any bushes or trees on the path to your home. Use bright outdoor lighting. Clear any walking paths of anything that might make someone trip, such as rocks or tools. Regularly check to see if handrails are loose or broken. Make sure that both sides of any steps have handrails. Any raised decks and porches should have guardrails on the edges. Have any leaves, snow, or ice cleared regularly. Use sand or salt on walking paths during winter. Clean up any spills in your garage right away. This includes oil or grease spills. What can I do in the bathroom? Use night lights. Install grab bars by the toilet and in the tub and shower. Do not use towel bars as grab bars. Use non-skid mats or decals in the tub or shower. If you need to sit down in the shower, use a plastic, non-slip stool. Keep the floor dry. Clean up any water that spills on the floor as soon as it happens. Remove soap buildup in the tub or shower regularly. Attach bath mats securely with double-sided non-slip rug tape. Do not have throw rugs and other things on the floor that can make you trip. What can I do in the bedroom? Use night  lights. Make sure that you have a light by your bed that is easy to reach. Do not use any sheets or blankets that are too big for your bed. They should not hang down onto the floor. Have a firm chair that has side arms. You can use this for support while you get dressed. Do not have throw rugs and other things on the floor that can make you trip. What can I do in the kitchen? Clean up any spills right away. Avoid walking on wet floors. Keep items that you use a lot in easy-to-reach places. If you need to reach something above you, use a strong step stool that has a grab bar. Keep electrical cords out of the way. Do not use floor polish or wax that makes floors slippery. If you must use wax, use non-skid floor wax. Do not have throw rugs and other things on the floor that can make you trip. What can I do with my stairs? Do not leave any items on the stairs. Make sure that there are handrails on both sides of the stairs and use them. Fix handrails that are broken or loose. Make sure that handrails are as long as the stairways. Check any carpeting to make sure that it is firmly attached to the stairs. Fix any carpet that is loose or worn. Avoid having throw rugs at the top or bottom of the stairs. If you  do have throw rugs, attach them to the floor with carpet tape. Make sure that you have a light switch at the top of the stairs and the bottom of the stairs. If you do not have them, ask someone to add them for you. What else can I do to help prevent falls? Wear shoes that: Do not have high heels. Have rubber bottoms. Are comfortable and fit you well. Are closed at the toe. Do not wear sandals. If you use a stepladder: Make sure that it is fully opened. Do not climb a closed stepladder. Make sure that both sides of the stepladder are locked into place. Ask someone to hold it for you, if possible. Clearly mark and make sure that you can see: Any grab bars or handrails. First and last  steps. Where the edge of each step is. Use tools that help you move around (mobility aids) if they are needed. These include: Canes. Walkers. Scooters. Crutches. Turn on the lights when you go into a dark area. Replace any light bulbs as soon as they burn out. Set up your furniture so you have a clear path. Avoid moving your furniture around. If any of your floors are uneven, fix them. If there are any pets around you, be aware of where they are. Review your medicines with your doctor. Some medicines can make you feel dizzy. This can increase your chance of falling. Ask your doctor what other things that you can do to help prevent falls. This information is not intended to replace advice given to you by your health care provider. Make sure you discuss any questions you have with your health care provider. Document Released: 07/28/2009 Document Revised: 03/08/2016 Document Reviewed: 11/05/2014 Elsevier Interactive Patient Education  2017 [employer].

## 2022-06-29 NOTE — Patient Instructions (Signed)
Ok to increase the Effexor XR to 150 mg per day  Please continue all other medications as before, and refills have been done if requested.  Please have the pharmacy call with any other refills you may need.  Please continue your efforts at being more active, low cholesterol diet, and weight control  Please keep your appointments with your specialists as you may have planned  Please make an Appointment to return in 6 months, or sooner if needed

## 2022-06-29 NOTE — Progress Notes (Addendum)
Subjective:   KHALE NIGH is a 66 y.o. male who presents for Medicare Annual/Subsequent preventive examination.  Review of Systems     Cardiac Risk Factors include: advanced age (>1mn, >>43women);dyslipidemia;hypertension;male gender;obesity (BMI >30kg/m2);sedentary lifestyle     Objective:    Today's Vitals   06/29/22 1300  BP: 120/78  Pulse: 74  Temp: 97.6 F (36.4 C)  SpO2: 97%  Weight: 256 lb 6.4 oz (116.3 kg)  Height: '5\' 9"'$  (1.753 m)   Body mass index is 37.86 kg/m.     06/29/2022    1:12 PM 11/02/2021    1:03 PM 10/17/2021    8:29 AM 10/05/2021    3:07 PM 05/06/2021    8:06 AM 05/06/2021    8:00 AM 04/27/2021    3:30 PM  Advanced Directives  Does Patient Have a Medical Advance Directive? Yes Yes No No No Yes Yes  Type of [place]     Out of facility DNR (pink MOST or yellow form);Living will  Does patient want to make changes to medical advance directive?     No - Patient declined No - Patient declined No - Patient declined  Copy of [place] Chart? No - copy requested        Would patient like information on creating a medical advance directive?   No - Patient declined No - Patient declined No - Patient declined      Current Medications (verified) Outpatient Encounter Medications as of 06/29/2022  Medication Sig   acetaminophen (TYLENOL) 500 MG tablet Take 500 mg by mouth every 6 (six) hours as needed for moderate pain.   albuterol (VENTOLIN HFA) 108 (90 Base) MCG/ACT inhaler INHALE 2 PUFFS BY MOUTH 4 TIMES A DAY AS NEEDED   ALPRAZolam (XANAX) 0.5 MG tablet TAKE 2 TABLETS BY MOUTH AT BEDTIME   apixaban (ELIQUIS) 5 MG TABS tablet Take 1 tablet (5 mg total) by mouth 2 (two) times daily.   augmented betamethasone dipropionate (DIPROLENE-AF) 0.05 % cream Apply 1 application topically daily as needed (itching).   carisoprodol (SOMA) 350 MG tablet TAKE 1  TABLET BY MOUTH THREE TIMES A DAY AS NEEDED FOR MUSCLE SPASMS   carvedilol (COREG) 6.25 MG tablet Take 1 tablet (6.25 mg total) by mouth 2 (two) times daily.   cephALEXin (KEFLEX) 500 MG capsule Take 1 capsule (500 mg total) by mouth 3 (three) times daily.   Cholecalciferol 100 MCG (4000 UT) CAPS 1 tab by mouth once daily   dapagliflozin propanediol (FARXIGA) 10 MG TABS tablet Take 1 tablet (10 mg total) by mouth daily before breakfast.   doxepin (SINEQUAN) 10 MG capsule TAKE 2 CAPSULES (20 MG TOTAL) BY MOUTH AT BEDTIME AS NEEDED.   DULoxetine (CYMBALTA) 60 MG capsule Take 1 capsule (60 mg total) by mouth daily. (Patient not taking: Reported on 06/29/2022)   Dupilumab (DUPIXENT) 300 MG/2ML SOPN Inject 300 mg into the skin every 14 (fourteen) days.   eplerenone (INSPRA) 50 MG tablet Take 1 tablet (50 mg total) by mouth daily.   Evolocumab (REPATHA SURECLICK) 1976MG/ML SOAJ INJECT 1 PEN INTO THE SKIN EVERY 14 (FOURTEEN) DAYS.   fluticasone (CUTIVATE) 0.05 % cream SMARTSIG:Topical 1-2 Times Daily PRN   fluticasone (FLONASE) 50 MCG/ACT nasal spray PLACE 2 SPRAYS INTO BOTH NOSTRILS DAILY AS NEEDED FOR ALLERGIES OR RHINITIS.   hydrOXYzine (ATARAX/VISTARIL) 10 MG tablet TAKE 1 TABLET BY MOUTH 3 TIMES DAILY AS  NEEDED FOR ITCHING.   ketoconazole (NIZORAL) 2 % cream APPLY 1 APPLICATION TOPICALLY DAILY   lidocaine (LIDODERM) 5 % APPLY 1 PATCH TO AFFECTED AREA FOR UP TO 12 HOURS. DO NOT USE MORE THAN ONE PATCH IN 24 HOURS.   losartan (COZAAR) 25 MG tablet Take 1 tablet (25 mg total) by mouth daily.   metolazone (ZAROXOLYN) 2.5 MG tablet Take 1 tablet (2.5 mg total) by mouth as directed. With additional 40 meq of potassium   omeprazole (PRILOSEC) 20 MG capsule TAKE 1 CAPSULE BY MOUTH TWICE A DAY   Potassium Chloride (KLOR-CON PO) Take 20 mcg by mouth daily.   predniSONE (DELTASONE) 20 MG tablet Take 1 tablet (20 mg total) by mouth daily with breakfast.   silver sulfADIAZINE (SILVADENE) 1 % cream APPLY  TOPICALLY TO AFFECTED AREA EVERY DAY   tamsulosin (FLOMAX) 0.4 MG CAPS capsule TAKE 1 CAPSULE BY MOUTH EVERY DAY   torsemide (DEMADEX) 20 MG tablet Take 2 tablets by mouth 2 times daily.   traMADol (ULTRAM) 50 MG tablet TAKE 1 TABLET BY MOUTH EVERY DAY AS NEEDED   traZODone (DESYREL) 100 MG tablet TAKE 2 TABLETS BY MOUTH AT BEDTIME AS NEEDED   TUMS 500 MG chewable tablet Chew 500-2,000 mg by mouth every 4 (four) hours as needed for indigestion or heartburn.    venlafaxine XR (EFFEXOR-XR) 75 MG 24 hr capsule Take 75 mg by mouth daily.   zolpidem (AMBIEN) 10 MG tablet TAKE 1 TABLET BY MOUTH EVERY DAY AT BEDTIME AS NEEDED   No facility-administered encounter medications on file as of 06/29/2022.    Allergies (verified) Amiodarone, Statins, and Tape   History: Past Medical History:  Diagnosis Date   AICD (automatic cardioverter/defibrillator) present    Anemia    supposed to be taking Vit B but doesn't   ANXIETY    takes Xanax nightly   Arthritis    Asthma    Albuterol prn and Advair daily;also takes Prednisone daily   Atrial fibrillation ([place]) 09/06/2015   Cardiomyopathy ([place])    a. EF 25% TEE July 2013; b. EF normalized 2015;  c. 03/2015 Echo: EF 40-45%, difrf HK, PASP 38 mmHg, Mild MR, sev LAE/RAE.   CHF (congestive heart failure) (HCC)    Chronic constipation    takes OTC stool softener   COPD (chronic obstructive pulmonary disease) (HCC)    "one dr says COPD; one dr says emphysema" (09/18/2017)   DEPRESSION    takes Zoloft and Doxepin daily   Diverticulitis    DYSKINESIA, ESOPHAGUS    Dysrhythmia    Atrial Fibrilation   Emphysema of lung [facility])    "one dr says COPD; one dr says emphysema" (09/18/2017)   Essential hypertension        FIBROMYALGIA    GERD (gastroesophageal reflux disease)        Glaucoma    HYPERLIPIDEMIA    a. Intolerant to statins.   INSOMNIA    takes Ambien nightly   Myocardial infarction [facility])    a. 2012 Myoview notable for prior infarct;  b. 03/2015  Lexiscan CL: EF 37%, diff HK, small area of inferior infarct from apex to base-->Med Rx.   Myocardial infarction ([place])    O2 dependent    "2.5L q hs & prn" (09/18/2017)   Paroxysmal atrial fibrillation ([place])    a. CHA2DS2VASc = 3--> takes Coumadin;  b. 03/15/2015 Successful TEE/DCCV;  c. 03/2015 recurrent afib, Am[place] d/c'd in setting of hyperthyroidism.   Peripheral neuropathy  Pneumonia 12/2016   Rash and other nonspecific skin eruption 04/12/2009   no cause found saw dermatologists x 2 and allergist   SLEEP APNEA, OBSTRUCTIVE    a. doesn't use CPAP   Syncope    a. 03/2015 s/p MDT LINQ.   Type II diabetes mellitus ([place])        Past Surgical History:  Procedure Laterality Date   ACNE CYST REMOVAL     2 on back    AV NODE ABLATION N/A 10/25/2017   Procedure: AV NODE ABLATION;  Surgeon: Deboraha Sprang, MD;  Location: [place] [facility];  Service: Cardiovascular;  Laterality: N/A;   BIV ICD INSERTION CRT-D N/A 09/18/2017   Procedure: BIV ICD INSERTION CRT-D;  Surgeon: Deboraha Sprang, MD;  Location: [place] [facility];  Service: Cardiovascular;  Laterality: N/A;   CARDIAC CATHETERIZATION N/A 03/21/2016   Procedure: Right/Left Heart Cath and Coronary Angiography;  Surgeon: Larey Dresser, MD;  Location: [place] [facility];  Service: Cardiovascular;  Laterality: N/A;   CARDIOVERSION  04/18/2012   Procedure: CARDIOVERSION;  Surgeon: Fay Records, MD;  Location: [place];  Service: Cardiovascular;  Laterality: N/A;   CARDIOVERSION  04/25/2012   Procedure: CARDIOVERSION;  Surgeon: Thayer Headings, MD;  Location: [place];  Service: Cardiovascular;  Laterality: N/A;   CARDIOVERSION  04/25/2012   Procedure: CARDIOVERSION;  Surgeon: Fay Records, MD;  Location: [place];  Service: Cardiovascular;  Laterality: N/A;   CARDIOVERSION  05/09/2012   Procedure: CARDIOVERSION;  Surgeon: Sherren Mocha, MD;  Location: [place];  Service: Cardiovascular;  Laterality: N/A;  changed from crenshaw to cooper by  trish/leone-endo   CARDIOVERSION N/A 03/15/2015   Procedure: CARDIOVERSION;  Surgeon: Thayer Headings, MD;  Location: [place];  Service: Cardiovascular;  Laterality: N/A;   COLONOSCOPY     COLONOSCOPY WITH PROPOFOL N/A 10/21/2014   Procedure: COLONOSCOPY WITH PROPOFOL;  Surgeon: Ladene Artist, MD;  Location: [facility];  Service: Endoscopy;  Laterality: N/A;   EP IMPLANTABLE DEVICE N/A 04/06/2015   Procedure: Loop Recorder Insertion;  Surgeon: Evans Lance, MD;  Location: [place] [facility];  Service: Cardiovascular;  Laterality: N/A;   ESOPHAGOGASTRODUODENOSCOPY     JOINT REPLACEMENT     LOOP RECORDER REMOVAL N/A 09/18/2017   Procedure: LOOP RECORDER REMOVAL;  Surgeon: Deboraha Sprang, MD;  Location: [place] [facility];  Service: Cardiovascular;  Laterality: N/A;   RIGHT/LEFT HEART CATH AND CORONARY ANGIOGRAPHY N/A 01/28/2017   Procedure: Right/Left Heart Cath and Coronary Angiography;  Surgeon: Larey Dresser, MD;  Location: [place] [facility];  Service: Cardiovascular;  Laterality: N/A;   TEE WITHOUT CARDIOVERSION  04/25/2012   Procedure: TRANSESOPHAGEAL ECHOCARDIOGRAM (TEE);  Surgeon: Thayer Headings, MD;  Location: [place];  Service: Cardiovascular;  Laterality: N/A;   TEE WITHOUT CARDIOVERSION N/A 03/15/2015   Procedure: TRANSESOPHAGEAL ECHOCARDIOGRAM (TEE);  Surgeon: Thayer Headings, MD;  Location: [place];  Service: Cardiovascular;  Laterality: N/A;   TONSILLECTOMY AND ADENOIDECTOMY     TOTAL KNEE ARTHROPLASTY Right 06/15/2014   Procedure: TOTAL KNEE ARTHROPLASTY;  Surgeon: Renette Butters, MD;  Location: [place];  Service: Orthopedics;  Laterality: Right;   TOTAL KNEE ARTHROPLASTY Left 10/17/2021   Procedure: Left TOTAL KNEE ARTHROPLASTY;  Surgeon: Mcarthur Rossetti, MD;  Location: [place];  Service: Orthopedics;  Laterality: Left;   Family History  Problem Relation Age of Onset   COPD Mother    Asthma Mother    Colon  polyps Mother    Allergies Mother     Hypothyroidism Mother    Asthma Maternal Grandmother    Colon cancer Neg Hx    Social History   Socioeconomic History   Marital status: Divorced    Spouse name: Not on file   Number of children: 2   Years of education: Not on file   Highest education level: Not on file  Occupational History   Occupation: retired/disabled. prev worked in Therapist, sports.    Employer: DISABLED  Tobacco Use   Smoking status: Former    Packs/day: 2.00    Years: 30.00    Total pack years: 60.00    Types: Cigarettes    Quit date: 10/16/2007    Years since quitting: 14.7   Smokeless tobacco: Never  Vaping Use   Vaping Use: Never used  Substance and Sexual Activity   Alcohol use: No   Drug use: No   Sexual activity: Not Currently  Other Topics Concern   Not on file  Social History Narrative   Lives alone.   Social Determinants of Health   Financial Resource Strain: Low Risk  (06/29/2022)   Overall Financial Resource Strain (CARDIA)    Difficulty of Paying Living Expenses: Not hard at all  Food Insecurity: No Food Insecurity (06/29/2022)   Hunger Vital Sign    Worried About Running Out of Food in the Last Year: Never true    Ran Out of Food in the Last Year: Never true  Transportation Needs: No Transportation Needs (06/29/2022)   PRAPARE - Hydrologist (Medical): No    Lack of Transportation (Non-Medical): No  Physical Activity: Inactive (06/29/2022)   Exercise Vital Sign    Days of Exercise per Week: 0 days    Minutes of Exercise per Session: 0 min  Stress: No Stress Concern Present (06/29/2022)   [place]    Feeling of Stress : Not at all  Social Connections: Moderately Integrated (06/29/2022)   Social Connection and Isolation Panel [NHANES]    Frequency of Communication with Friends and Family: More than three times a week    Frequency of Social Gatherings with Friends and Family: More than  three times a week    Attends Religious Services: More than 4 times per year    Active Member of [employer] or Organizations: Yes    Attends Music therapist: More than 4 times per year    Marital Status: Divorced    Tobacco Counseling Counseling given: Not Answered   Clinical Intake:  Pre-visit preparation completed: Yes  Pain : No/denies pain     BMI - recorded: 37.86 Nutritional Risks: None Diabetes: No  How often do you need to have someone help you when you read instructions, pamphlets, or other written materials from your doctor or pharmacy?: 1 - Never What is the last grade level you completed in school?: HSG  Diabetic? no  Interpreter Needed?: No  Information entered by :: Lisette Abu, LPN.   Activities of Daily Living    06/29/2022    1:32 PM 10/05/2021    3:13 PM  In your present state of health, do you have any difficulty performing the following activities:  Hearing? 0   Vision? 0   Difficulty concentrating or making decisions? 0   Walking or climbing stairs? 1   Comment uses a cane   Dressing or bathing? 0   Doing errands, shopping? 0 0  Preparing Food and eating ? N   Using the Toilet? N   In the past six months, have you accidently leaked urine? N   Do you have problems with loss of bowel control? N   Managing your Medications? N   Managing your Finances? N   Housekeeping or managing your Housekeeping? N     Patient Care Team: [name], MD as PCP - General Larey Dresser, MD as PCP - Advanced Heart Failure (Cardiology) Deboraha Sprang, MD as PCP - Electrophysiology (Cardiology) Himmelrich, Bryson Ha, RD (Inactive) as Dietitian Deboraha Sprang, MD as Consulting Physician (Cardiology) Mcarthur Rossetti, MD as Consulting Physician (Orthopedic Surgery) Ruthy Dick, MD as Referring Physician (Ophthalmology)  Indicate any recent Medical Services you may have received from other than [facility] providers in the past year  (date may be approximate).     Assessment:   This is a routine wellness examination for Justun.  Hearing/Vision screen Hearing Screening - Comments:: Denies hearing difficulties   Vision Screening - Comments:: Wears rx glasses - up to date with routine eye exams with Moses Manners, MD.   Dietary issues and exercise activities discussed: Current Exercise Habits: The patient does not participate in regular exercise at present, Exercise limited by: None identified;orthopedic condition(s);respiratory conditions(s);cardiac condition(s)   Goals Addressed             This Visit's Progress    To be medically well.        Depression Screen    06/29/2022    1:15 PM 05/17/2022    2:50 PM 03/22/2022    3:00 PM 12/28/2021    2:15 PM 12/20/2021    3:46 PM 12/20/2021    3:12 PM 11/20/2021    1:00 PM  PHQ 2/9 Scores  PHQ - 2 Score 0 4 0 0 0 0 0  PHQ- 9 Score  20         Fall Risk    06/29/2022    1:01 PM 05/17/2022    2:50 PM 03/22/2022    3:00 PM 12/28/2021    2:15 PM 12/20/2021    3:46 PM  Fall Risk   Falls in the past year? 0 0 1  0  Comment   reports last fall without injury "in December" continues to use cane Denies new/ recent falls since last outreach 11/20/21; continues using cane post- (L) TKR   Number falls in past yr: 0 0 0  0  Injury with Fall? 0 0 0  0  Risk for fall due to : No Fall Risks  History of fall(s);Medication side effect;Impaired mobility Impaired mobility;Orthopedic patient;History of fall(s);Medication side effect   Follow up Falls prevention discussed  Falls prevention discussed Falls prevention discussed     FALL RISK PREVENTION PERTAINING TO THE HOME:  Any stairs in or around the home? Yes  If so, are there any without handrails? No  Home free of loose throw rugs in walkways, pet beds, electrical cords, etc? Yes  Adequate lighting in your home to reduce risk of falls? Yes   ASSISTIVE DEVICES UTILIZED TO PREVENT FALLS:  Life alert? No  Use of a cane,  walker or w/c? Yes  Grab bars in the bathroom? No  Shower chair or bench in shower? No  Elevated toilet seat or a handicapped toilet? No   TIMED UP AND GO:  Was the test performed? Yes .  Length of time to ambulate 10 feet: 10 sec.   Gait slow  and steady with assistive device  Cognitive Function:        06/29/2022    1:33 PM  6CIT Screen  What Year? 0 points  What month? 0 points  What time? 0 points  Count back from 20 0 points  Months in reverse 0 points  Repeat phrase 0 points  Total Score 0 points    Immunizations Immunization History  Administered Date(s) Administered   Influenza Inj Mdck Quad With Preservative 07/22/2019   Influenza Split 07/09/2011, 07/23/2012   Influenza Whole 09/16/2006, 08/26/2007, 07/27/2008, 07/26/2009, 07/07/2010   Influenza, High Dose Seasonal PF 06/22/2020, 07/07/2021   Influenza,inj,Quad PF,6+ Mos 06/30/2013, 08/19/2014, 06/27/2015, 07/13/2016, 07/09/2017, 07/18/2018   PFIZER(Purple Top)SARS-COV-2 Vaccination 03/21/2020, 04/11/2020, 11/03/2020   Pneumococcal Conjugate-13 04/29/2017   Pneumococcal Polysaccharide-23 03/29/2010, 07/07/2010, 02/23/2015, 03/17/2021   Td 10/16/2003   Tdap 02/23/2015    TDAP status: Up to date  Flu Vaccine status: Completed at today's visit  Pneumococcal vaccine status: Up to date  Covid-19 vaccine status: Completed vaccines  Qualifies for Shingles Vaccine? Yes   Zostavax completed No   Shingrix Completed?: No.    Education has been provided regarding the importance of this vaccine. Patient has been advised to call insurance company to determine out of pocket expense if they have not yet received this vaccine. Advised may also receive vaccine at local pharmacy or Health Dept. Verbalized acceptance and understanding.  Screening Tests Health Maintenance  Topic Date Due   COLONOSCOPY (Pts 45-58yr Insurance coverage will need to be confirmed)  03/03/2020   OPHTHALMOLOGY EXAM  02/04/2021   INFLUENZA  VACCINE  05/15/2022   HEMOGLOBIN A1C  06/22/2022   FOOT EXAM  09/21/2022   Diabetic kidney evaluation - Urine ACR  12/21/2022   Diabetic kidney evaluation - GFR measurement  03/31/2023   TETANUS/TDAP  02/22/2025   Pneumonia Vaccine 67 Years old  Completed   Hepatitis C Screening  Completed   HPV VACCINES  Aged Out   COVID-19 Vaccine  Discontinued   Zoster Vaccines- Shingrix  Discontinued    Health Maintenance  Health Maintenance Due  Topic Date Due   COLONOSCOPY (Pts 45-453yrInsurance coverage will need to be confirmed)  03/03/2020   OPHTHALMOLOGY EXAM  02/04/2021   INFLUENZA VACCINE  05/15/2022   HEMOGLOBIN A1C  06/22/2022    Colorectal cancer screening: Type of screening: Colonoscopy. Completed 03/04/2015. Repeat every 5 years ([facility] Lung [facility] Screening: (Low Dose CT Chest recommended if Age 67-80ears, 30 pack-year currently smoking OR have quit w/in 15years.) does not qualify.   Lung Cancer Screening Referral: no  Additional Screening:  Hepatitis C Screening: does qualify; Completed 11/29/2015  Vision Screening: Recommended annual ophthalmology exams for early detection of glaucoma and other disorders of the eye. Is the patient up to date with their annual eye exam?  Yes  Who is the provider or what is the name of the office in which the patient attends annual eye exams? [facility] If pt is not established with a provider, would they like to be referred to a provider to establish care? No .   Dental Screening: Recommended annual dental exams for proper oral hygiene  Community Resource Referral / Chronic Care Management: CRR required this visit?  No   CCM required this visit?  No      Plan:     I have personally reviewed and noted the following in the patient's chart:   Medical and social history Use of  alcohol, tobacco or illicit drugs  Current medications and supplements including opioid prescriptions. Patient  is not currently taking opioid prescriptions. Functional ability and status Nutritional status Physical activity Advanced directives List of other physicians Hospitalizations, surgeries, and ER visits in previous 12 months Vitals Screenings to include cognitive, depression, and falls Referrals and appointments  In addition, I have reviewed and discussed with patient certain preventive protocols, quality metrics, and best practice recommendations. A written personalized care plan for preventive services as well as general preventive health recommendations were provided to patient.     Sheral Flow, LPN   05/12/2059   Nurse Notes:

## 2022-07-01 ENCOUNTER — Encounter: Payer: Self-pay | Admitting: Internal Medicine

## 2022-07-01 NOTE — Assessment & Plan Note (Signed)
Lab Results  Component Value Date   LDLCALC 28 12/20/2021   Stable, pt to continue current low chol diet, delcines statin

## 2022-07-01 NOTE — Assessment & Plan Note (Signed)
Lab Results  Component Value Date   HGBA1C 5.4 12/20/2021   Stable, pt to continue current medical treatment farxiga 10 mg qd, declines a1c f/u today

## 2022-07-01 NOTE — Assessment & Plan Note (Signed)
Last vitamin D Lab Results  Component Value Date   VD25OH 34.25 12/20/2021   Low, reminded to start oral replacement  

## 2022-07-01 NOTE — Assessment & Plan Note (Signed)
With moderate to severe worseing depression, no SI or HI, ok for increased effexor 150 mg qd, declines need for referral psychiatry or counseling at this time

## 2022-07-02 ENCOUNTER — Telehealth: Payer: Medicare HMO

## 2022-07-03 ENCOUNTER — Ambulatory Visit: Payer: Medicare HMO | Admitting: Podiatry

## 2022-07-05 ENCOUNTER — Encounter (HOSPITAL_BASED_OUTPATIENT_CLINIC_OR_DEPARTMENT_OTHER): Payer: Medicare HMO | Admitting: Internal Medicine

## 2022-07-05 DIAGNOSIS — T798XXA Other early complications of trauma, initial encounter: Secondary | ICD-10-CM | POA: Diagnosis not present

## 2022-07-05 DIAGNOSIS — I4819 Other persistent atrial fibrillation: Secondary | ICD-10-CM | POA: Diagnosis not present

## 2022-07-05 DIAGNOSIS — I428 Other cardiomyopathies: Secondary | ICD-10-CM | POA: Diagnosis not present

## 2022-07-05 DIAGNOSIS — J449 Chronic obstructive pulmonary disease, unspecified: Secondary | ICD-10-CM | POA: Diagnosis not present

## 2022-07-05 DIAGNOSIS — I87311 Chronic venous hypertension (idiopathic) with ulcer of right lower extremity: Secondary | ICD-10-CM | POA: Diagnosis not present

## 2022-07-05 DIAGNOSIS — I110 Hypertensive heart disease with heart failure: Secondary | ICD-10-CM | POA: Diagnosis not present

## 2022-07-05 DIAGNOSIS — I5022 Chronic systolic (congestive) heart failure: Secondary | ICD-10-CM | POA: Diagnosis not present

## 2022-07-05 DIAGNOSIS — E11622 Type 2 diabetes mellitus with other skin ulcer: Secondary | ICD-10-CM | POA: Diagnosis not present

## 2022-07-05 DIAGNOSIS — L97812 Non-pressure chronic ulcer of other part of right lower leg with fat layer exposed: Secondary | ICD-10-CM | POA: Diagnosis not present

## 2022-07-09 ENCOUNTER — Encounter: Payer: Self-pay | Admitting: Internal Medicine

## 2022-07-09 ENCOUNTER — Ambulatory Visit: Payer: Medicare HMO | Admitting: Podiatry

## 2022-07-09 DIAGNOSIS — I739 Peripheral vascular disease, unspecified: Secondary | ICD-10-CM

## 2022-07-09 DIAGNOSIS — L84 Corns and callosities: Secondary | ICD-10-CM

## 2022-07-09 DIAGNOSIS — M79674 Pain in right toe(s): Secondary | ICD-10-CM

## 2022-07-09 DIAGNOSIS — E119 Type 2 diabetes mellitus without complications: Secondary | ICD-10-CM | POA: Diagnosis not present

## 2022-07-09 DIAGNOSIS — M79675 Pain in left toe(s): Secondary | ICD-10-CM

## 2022-07-09 DIAGNOSIS — B351 Tinea unguium: Secondary | ICD-10-CM | POA: Diagnosis not present

## 2022-07-09 NOTE — Progress Notes (Signed)
  Subjective:  Patient ID: [name], male    DOB: [birth year],  MRN: [id number]  Chief Complaint  Patient presents with   Nail Problem    Thick painful toenails, 3 month follow up      66 y.o. male returns with the above complaint. History confirmed with patient.  His nails are thickened elongated and causing pain again.  He has a callus build up on the right foot near the great toe.   Objective:  Physical Exam: warm, good capillary refill, no trophic changes or ulcerative lesions, normal sensory exam, +2 DP and PT reduced bilateral and venous stasis dermatitis noted.  Moderate to severe hallux abductovalgus with hammertoe contractures 2 through 5 bilaterally, right worse than left.  Thickened elongated dystrophic mycotic nails x10 with pincer nail deformity.   Callus medial hallux and MTPJ   Summary:  Right: Resting right ankle-brachial index indicates noncompressible right  lower extremity arteries. The right toe-brachial index is normal. ABIs are  unreliable.   Left: Resting left ankle-brachial index is within normal range. No  evidence of significant left lower extremity arterial disease. The left  toe-brachial index is normal.       *See table(s) above for measurements and observations.         Radiographs: X-ray of the right foot: Moderate hallux abductovalgus with the presence of metatarsus adductus deformity and pes planus, no increased change in alignment since last radiographs Assessment:   1. Pain due to onychomycosis of toenails of both feet   2. Type 2 diabetes mellitus without complication, without long-term current use of insulin (HCC)   3. Callus of foot   4. Peripheral vascular disease ([place])        Plan:  Patient was evaluated and treated and all questions answered.  Patient educated on diabetes. Discussed proper diabetic foot care and discussed risks and complications of disease. Educated patient in depth on reasons to return to the office  immediately should he/she discover anything concerning or new on the feet. All questions answered. Discussed proper shoes as well.  Discussed the etiology and treatment options for the condition in detail with the patient. Educated patient on the topical and oral treatment options for mycotic nails. Recommended debridement of the nails today. Sharp and mechanical debridement performed of all painful and mycotic nails today. Nails debrided in length and thickness using a nail nipper to level of comfort. Discussed treatment options including appropriate shoe gear. Follow up as needed for painful nails.  Ingrowing corners of hallux nails debrided and slant back fashion  All symptomatic hyperkeratoses were safely debrided with a sterile #15 blade to patient's level of comfort without incident. We discussed preventative and palliative care of these lesions including supportive and accommodative shoegear, padding, prefabricated and custom molded accommodative orthoses, use of a pumice stone and lotions/creams daily.   Return in about 12 weeks (around 10/01/2022) for at risk diabetic foot care.

## 2022-07-10 ENCOUNTER — Encounter: Payer: Self-pay | Admitting: Internal Medicine

## 2022-07-10 ENCOUNTER — Other Ambulatory Visit: Payer: Self-pay | Admitting: Internal Medicine

## 2022-07-10 DIAGNOSIS — I428 Other cardiomyopathies: Secondary | ICD-10-CM

## 2022-07-10 DIAGNOSIS — I4819 Other persistent atrial fibrillation: Secondary | ICD-10-CM

## 2022-07-10 DIAGNOSIS — I255 Ischemic cardiomyopathy: Secondary | ICD-10-CM

## 2022-07-10 MED ORDER — ALPRAZOLAM 0.5 MG PO TABS: 1.0000 mg | ORAL_TABLET | Freq: Every day | ORAL | 5 refills | Status: DC

## 2022-07-11 NOTE — Progress Notes (Signed)
[name] ([id number]) Visit Report for 07/05/2022 Chief Complaint Document Details Patient Name: Date of Service: [name] RD L. 07/05/2022 3:30 PM Medical Record Number: [id number] Patient Account Number: [id number] Date of Birth/Sex: Treating RN: [birth year] (66 y.o. [name] Primary Care Provider: [name] Other Clinician: Referring Provider: Treating Provider/Extender: [name] in Treatment: 6 Information Obtained from: Patient Chief Complaint 05/24/2022; right lower extremity wound Electronic Signature(s) Signed: 07/06/2022 9:20:47 AM By: [name] Entered By: Kalman Shan on 07/05/2022 16:50:45 -------------------------------------------------------------------------------- Debridement Details Patient Name: Date of Service: [name] RD L. 07/05/2022 3:30 PM Medical Record Number: [id number] Patient Account Number: [id number] Date of Birth/Sex: Treating RN: 11-06-1955 (66 y.o. [name] Primary Care Provider: [name] Other Clinician: Referring Provider: Treating Provider/Extender: [name] in Treatment: 6 Debridement Performed for Assessment: Wound #3 Right,Medial Lower Leg Performed By: Physician Kalman Shan, DO Debridement Type: Debridement Severity of Tissue Pre Debridement: Fat layer exposed Level of Consciousness (Pre-procedure): Awake and Alert Pre-procedure Verification/Time Out Yes - 16:10 Taken: Start Time: 16:10 Pain Control: Lidocaine 4% T opical Solution T Area Debrided (L x W): otal 0.7 (cm) x 0.7 (cm) = 0.49 (cm) Tissue and other material debrided: Non-Viable, Slough, Subcutaneous, Slough Level: Skin/Subcutaneous Tissue Debridement Description: Excisional Instrument: Curette Bleeding: Minimum Hemostasis Achieved: Pressure Procedural Pain: 0 Post Procedural Pain: 0 Response to Treatment: Procedure was tolerated well Level of Consciousness (Post-  Awake and Alert procedure): Post Debridement Measurements of Total Wound Length: (cm) 0.7 Width: (cm) 0.7 Depth: (cm) 0.1 Volume: (cm) 0.038 Character of Wound/Ulcer Post Debridement: Improved Severity of Tissue Post Debridement: Fat layer exposed Post Procedure Diagnosis Same as Pre-procedure Electronic Signature(s) Signed: 07/05/2022 4:57:37 PM By: Adline Peals Signed: 07/06/2022 9:20:47 AM By: [name] Entered By: Adline Peals on 07/05/2022 16:11:51 -------------------------------------------------------------------------------- HPI Details Patient Name: Date of Service: [name] RD L. 07/05/2022 3:30 PM Medical Record Number: [id number] Patient Account Number: [id number] Date of Birth/Sex: Treating RN: [birth year] (66 y.o. [name] Primary Care Provider: [name] Other Clinician: Referring Provider: Treating Provider/Extender: [name] in Treatment: 6 History of Present Illness HPI Description: Admission 5/2 [name] is a 66 year old male with a past medical history of systolic congestive heart failure, COPD, nonischemic cardiomyopathy, type 2 diabetes, chronic venous insufficiency that presents to the clinic for a 57-monthhistory of nonhealing wound to his left lower extremity. He states that in November 2021 he fell off his porch and Hit his leg against a brick. The wound has healed mostly except for 1 area that is still open. He reports moderate Serosanguineous drainage daily. He has been keeping it covered with Band-Aids. He reports 2 rounds of antibiotics for this issue. He denies pain. He denies current acute signs of infection. 5/9; patient presents for 1 week follow-up. He has been using Hydrofera Blue under 3 layer compression. He has no issues or complaints today. He denies signs of infection. 5/16; patient presents for 1 week follow-up. He has been using Hydrofera Blue under 3 layer compression. He has no  issues or complaints today. He denies signs of infection. He states he overall feels well 5/23; patient presents for 1 week follow-up. He has been using Hydrofera Blue under 3 layer compression. He has no issues or complaints today. He denies signs of infection. 6/6; patient presents for 2-week follow-up. He has been using Hydrofera Blue under 3 layer compression. He has had no issues  with this. He denies any signs of infection and has no complaints today. Readmissiondifferent wound 6/27 patient developed another wound to his right lower extremity. He states that 1 week ago he had a large admission for a different wound Paining that was hung over his couch fell on his leg. He has been using alcohol and keeping the area covered. He denies signs of infection. 7/5; patient presents for 1 week follow-up. He has been tolerating Hydrofera Blue under 3 layer compression. He does report some pain to the wound site. He denies signs of infection. 7/11; patient presents for 1 week follow-up. He has been doing well with Hydrofera Blue under 3 layer compression. He reports improvement in pain since last clinic visit. He has no complaints today. He denies signs of infection. 7/18; patient presents for 1 week follow-up. Has been doing well with Hydrofera Blue under 3 layer compression. He denies signs of infection. Admission 05/24/2022 [name] is a 66 year old male with a past medical history of venous insufficiency that presents to the clinic for a 1 month history of nonhealing wound to his right lower extremity. He states that a wagon wheel hit it and caused a wound. He has been treated with Keflex for this issue By his podiatrist. He has been using silver alginate to the wound bed with dressing changes. He currently denies signs of infection. He is not wearing compression stockings. 8/15; patient presents for follow-up. We have been using antibiotic ointment with Hydrofera Blue under 3 layer  compression. He tolerated the compression wrap well. He has no issues or complaints today. He denies signs of infection. 8/22; patient presents for follow-up. We have been using Santyl and Hydrofera Blue under 3 layer compression. He has no issues or complaints today. 8/29; patient presents for follow-up. We have been using collagen to the wound bed under 3 layer compression. He has no issues or complaints today. 9/7; patient presents for follow-up. We have been using collagen under 3 layer compression. Patient has no issues or complaints today. 9/12; patient presents for follow-up. We have been using collagen under 3 layer compression. Patient has no issues or complaints today. 9/21; patient presents for follow-up. We have been using endoform under 3 layer compression. Patient has no issues or complaints today. He tolerated the wrap well. Electronic Signature(s) Signed: 07/06/2022 9:20:47 AM By: [name] Entered By: Kalman Shan on 07/05/2022 16:51:24 -------------------------------------------------------------------------------- Physical Exam Details Patient Name: Date of Service: [name] RD L. 07/05/2022 3:30 PM Medical Record Number: [id number] Patient Account Number: [id number] Date of Birth/Sex: Treating RN: 1955/11/05 (66 y.o. [name] Primary Care Provider: [name] Other Clinician: Referring Provider: Treating Provider/Extender: [name] in Treatment: 6 Constitutional respirations regular, non-labored and within target range for patient.. Cardiovascular 2+ dorsalis pedis/posterior tibialis pulses. Psychiatric pleasant and cooperative. Notes Right lower extremity: T the proximal medial aspect there is an open wound with granulation tissue present And nonviable tissue. Venous stasis dermatitis. o Good edema control. Electronic Signature(s) Signed: 07/06/2022 9:20:47 AM By: [name] Entered By: Kalman Shan  on 07/05/2022 16:52:00 -------------------------------------------------------------------------------- Physician Orders Details Patient Name: Date of Service: [name] RD L. 07/05/2022 3:30 PM Medical Record Number: [id number] Patient Account Number: [id number] Date of Birth/Sex: Treating RN: 1956/09/19 (66 y.o. [name] Primary Care Provider: [name] Other Clinician: Referring Provider: Treating Provider/Extender: [name] in Treatment: 6 Verbal / Phone Orders: No Diagnosis Coding Follow-up Appointments ppointment in  1 week. - with Dr. Heber [place] and Leveda Anna, RN (Room 7) Return A Anesthetic Wound #3 Right,Medial Lower Leg (In clinic) Topical Lidocaine 4% applied to wound bed Cellular or Tissue Based Products Other Cellular or Tissue Based Products Orders/Instructions: - IVR for [employer] May shower with protection but do not get wound dressing(s) wet. - May shower using cast protector bag. Edema Control - Lymphedema / SCD / Other Elevate legs to the level of the heart or above for 30 minutes daily and/or when sitting, a frequency of: - several times a day Avoid standing for long periods of time. Additional Orders / Instructions Follow Nutritious Diet Wound Treatment Wound #3 - Lower Leg Wound Laterality: Right, Medial Cleanser: Soap and Water 1 x Per Week/30 Days Discharge Instructions: May shower and wash wound with dial antibacterial soap and water prior to dressing change. Cleanser: Wound Cleanser 1 x Per Week/30 Days Discharge Instructions: Cleanse the wound with wound cleanser prior to applying a clean dressing using gauze sponges, not tissue or cotton balls. Peri-Wound Care: Triamcinolone 15 (g) 1 x Per Week/30 Days Discharge Instructions: Use triamcinolone 15 (g) as directed Peri-Wound Care: Sween Lotion (Moisturizing lotion) 1 x Per Week/30 Days Discharge Instructions: Apply moisturizing lotion as  directed Prim Dressing: Xeroform Occlusive Gauze Dressing, 4x4 in 1 x Per Week/30 Days ary Discharge Instructions: Apply to wound bed as instructed Secondary Dressing: ABD Pad, 5x9 1 x Per Week/30 Days Discharge Instructions: Apply over primary dressing as directed. Compression Wrap: ThreePress (3 layer compression wrap) 1 x Per Week/30 Days Discharge Instructions: Apply three layer compression as directed. Patient Medications llergies: No Known Allergies A Notifications Medication Indication Start End 07/05/2022 lidocaine DOSE topical 4 % cream - cream topical Electronic Signature(s) Signed: 07/06/2022 9:20:47 AM By: [name] Entered By: Kalman Shan on 07/05/2022 16:52:09 -------------------------------------------------------------------------------- Problem List Details Patient Name: Date of Service: [name] RD L. 07/05/2022 3:30 PM Medical Record Number: [id number] Patient Account Number: [id number] Date of Birth/Sex: Treating RN: January 14, 1956 (67 y.o. [name] Primary Care Provider: [name] Other Clinician: Referring Provider: Treating Provider/Extender: [name] in Treatment: 6 Active Problems ICD-10 Encounter Code Description Active Date MDM Diagnosis I87.311 Chronic venous hypertension (idiopathic) with ulcer of right lower extremity 05/24/2022 No Yes L97.812 Non-pressure chronic ulcer of other part of right lower leg with fat layer 05/24/2022 No Yes exposed R74.08 Chronic systolic (congestive) heart failure 05/24/2022 No Yes I10 Essential (primary) hypertension 05/24/2022 No Yes I42.8 Other cardiomyopathies 05/24/2022 No Yes J44.9 Chronic obstructive pulmonary disease, unspecified 05/24/2022 No Yes I48.19 Other persistent atrial fibrillation 05/24/2022 No Yes E11.622 Type 2 diabetes mellitus with other skin ulcer 05/24/2022 No Yes T79.8XXA Other early complications of trauma, initial encounter 05/24/2022 No  Yes Inactive Problems Resolved Problems Electronic Signature(s) Signed: 07/06/2022 9:20:47 AM By: [name] Entered By: Kalman Shan on 07/05/2022 16:50:27 -------------------------------------------------------------------------------- Progress Note Details Patient Name: Date of Service: [name] RD L. 07/05/2022 3:30 PM Medical Record Number: [id number] Patient Account Number: [id number] Date of Birth/Sex: Treating RN: 05-29-1956 (66 y.o. [name] Primary Care Provider: [name] Other Clinician: Referring Provider: Treating Provider/Extender: [name] in Treatment: 6 Subjective Chief Complaint Information obtained from Patient 05/24/2022; right lower extremity wound History of Present Illness (HPI) Admission 5/2 Mr. Collister is a 66 year old male with a past medical history of systolic congestive heart failure, COPD, nonischemic cardiomyopathy, type 2 diabetes, chronic venous insufficiency that presents to the clinic  for a 36-monthhistory of nonhealing wound to his left lower extremity. He states that in November 2021 he fell off his porch and Hit his leg against a brick. The wound has healed mostly except for 1 area that is still open. He reports moderate Serosanguineous drainage daily. He has been keeping it covered with Band-Aids. He reports 2 rounds of antibiotics for this issue. He denies pain. He denies current acute signs of infection. 5/9; patient presents for 1 week follow-up. He has been using Hydrofera Blue under 3 layer compression. He has no issues or complaints today. He denies signs of infection. 5/16; patient presents for 1 week follow-up. He has been using Hydrofera Blue under 3 layer compression. He has no issues or complaints today. He denies signs of infection. He states he overall feels well 5/23; patient presents for 1 week follow-up. He has been using Hydrofera Blue under 3 layer compression. He has no issues  or complaints today. He denies signs of infection. 6/6; patient presents for 2-week follow-up. He has been using Hydrofera Blue under 3 layer compression. He has had no issues with this. He denies any signs of infection and has no complaints today. Readmissionoodifferent wound 6/27 patient developed another wound to his right lower extremity. He states that 1 week ago he had a large admission for a different wound Paining that was hung over his couch fell on his leg. He has been using alcohol and keeping the area covered. He denies signs of infection. 7/5; patient presents for 1 week follow-up. He has been tolerating Hydrofera Blue under 3 layer compression. He does report some pain to the wound site. He denies signs of infection. 7/11; patient presents for 1 week follow-up. He has been doing well with Hydrofera Blue under 3 layer compression. He reports improvement in pain since last clinic visit. He has no complaints today. He denies signs of infection. 7/18; patient presents for 1 week follow-up. Has been doing well with Hydrofera Blue under 3 layer compression. He denies signs of infection. Admission 05/24/2022 Mr. RKaynen Minneris a 66year old male with a past medical history of venous insufficiency that presents to the clinic for a 1 month history of nonhealing wound to his right lower extremity. He states that a wagon wheel hit it and caused a wound. He has been treated with Keflex for this issue By his podiatrist. He has been using silver alginate to the wound bed with dressing changes. He currently denies signs of infection. He is not wearing compression stockings. 8/15; patient presents for follow-up. We have been using antibiotic ointment with Hydrofera Blue under 3 layer compression. He tolerated the compression wrap well. He has no issues or complaints today. He denies signs of infection. 8/22; patient presents for follow-up. We have been using Santyl and Hydrofera Blue under 3 layer  compression. He has no issues or complaints today. 8/29; patient presents for follow-up. We have been using collagen to the wound bed under 3 layer compression. He has no issues or complaints today. 9/7; patient presents for follow-up. We have been using collagen under 3 layer compression. Patient has no issues or complaints today. 9/12; patient presents for follow-up. We have been using collagen under 3 layer compression. Patient has no issues or complaints today. 9/21; patient presents for follow-up. We have been using endoform under 3 layer compression. Patient has no issues or complaints today. He tolerated the wrap well. Patient History Information obtained from Patient, Chart. Family History Heart Disease - Father,  Hypertension - Mother,Father,Siblings, Lung Disease - Mother, Thyroid Problems - Mother, No family history of Cancer, Diabetes, Hereditary Spherocytosis, Kidney Disease, Seizures, Stroke, Tuberculosis. Social History Former smoker - quit 2009, Marital Status - Divorced, Alcohol Use - Never, Drug Use - No History, Caffeine Use - Daily - coffee, diet soda. Medical History Eyes Patient has history of Cataracts, Glaucoma Denies history of Optic Neuritis Hematologic/Lymphatic Patient has history of Anemia Respiratory Patient has history of Asthma, Chronic Obstructive Pulmonary Disease (COPD), Sleep Apnea - sleeps in recliner Denies history of Pneumothorax Cardiovascular Patient has history of Arrhythmia - afib, Congestive Heart Failure, Deep Vein Thrombosis, Myocardial Infarction, Peripheral Venous Disease Endocrine Patient has history of Type II Diabetes Denies history of Type I Diabetes Genitourinary Denies history of End Stage Renal Disease Integumentary (Skin) Denies history of History of Burn Musculoskeletal Patient has history of Osteoarthritis Oncologic Denies history of Received Chemotherapy, Received Radiation Psychiatric Denies history of Anorexia/bulimia,  Confinement Anxiety Medical A Surgical History Notes nd Constitutional Symptoms (General Health) obesity Cardiovascular cardiomyopathy, pacemaker/defibulator Gastrointestinal GERD Endocrine thyrotoxicosis Genitourinary hx kidney stones, BPH Musculoskeletal thoracic and lumbosacral spondylosis Objective Constitutional respirations regular, non-labored and within target range for patient.. Vitals Time Taken: 3:33 PM, Height: 70 in, Weight: 253 lbs, BMI: 36.3, Temperature: 98 F, Pulse: 79 bpm, Respiratory Rate: 18 breaths/min, Blood Pressure: 128/77 mmHg. Cardiovascular 2+ dorsalis pedis/posterior tibialis pulses. Psychiatric pleasant and cooperative. General Notes: Right lower extremity: T the proximal medial aspect there is an open wound with granulation tissue present And nonviable tissue. Venous stasis o dermatitis. Good edema control. Integumentary (Hair, Skin) Wound #3 status is Open. Original cause of wound was Trauma. The date acquired was: 04/15/2022. The wound has been in treatment 6 weeks. The wound is located on the Right,Medial Lower Leg. The wound measures 0.7cm length x 0.7cm width x 0.1cm depth; 0.385cm^2 area and 0.038cm^3 volume. There is Fat Layer (Subcutaneous Tissue) exposed. There is no tunneling or undermining noted. There is a medium amount of serosanguineous drainage noted. The wound margin is distinct with the outline attached to the wound base. There is small (1-33%) red granulation within the wound bed. There is a large (67-100%) amount of necrotic tissue within the wound bed including Eschar and Adherent Slough. Assessment Active Problems ICD-10 Chronic venous hypertension (idiopathic) with ulcer of right lower extremity Non-pressure chronic ulcer of other part of right lower leg with fat layer exposed Chronic systolic (congestive) heart failure Essential (primary) hypertension Other cardiomyopathies Chronic obstructive pulmonary disease,  unspecified Other persistent atrial fibrillation Type 2 diabetes mellitus with other skin ulcer Other early complications of trauma, initial encounter Patient's wound has shown improvement in size and appearance since last clinic visit. I debrided nonviable tissue. I recommended using Xeroform as the endoform had stuck on tightly to the wound bed. Continue compression therapy. Procedures Wound #3 Pre-procedure diagnosis of Wound #3 is a Venous Leg Ulcer located on the Right,Medial Lower Leg .Severity of Tissue Pre Debridement is: Fat layer exposed. There was a Excisional Skin/Subcutaneous Tissue Debridement with a total area of 0.49 sq cm performed by Kalman Shan, DO. With the following instrument(s): Curette to remove Non-Viable tissue/material. Material removed includes Subcutaneous Tissue and Slough and after achieving pain control using Lidocaine 4% T opical Solution. No specimens were taken. A time out was conducted at 16:10, prior to the start of the procedure. A Minimum amount of bleeding was controlled with Pressure. The procedure was tolerated well with a pain level of 0 throughout and a pain  level of 0 following the procedure. Post Debridement Measurements: 0.7cm length x 0.7cm width x 0.1cm depth; 0.038cm^3 volume. Character of Wound/Ulcer Post Debridement is improved. Severity of Tissue Post Debridement is: Fat layer exposed. Post procedure Diagnosis Wound #3: Same as Pre-Procedure Plan Follow-up Appointments: Return Appointment in 1 week. - with Dr. Heber [place] and Leveda Anna, RN (Room 7) Anesthetic: Wound #3 Right,Medial Lower Leg: (In clinic) Topical Lidocaine 4% applied to wound bed Cellular or Tissue Based Products: Other Cellular or Tissue Based Products Orders/Instructions: - IVR for [employer] Hygiene: May shower with protection but do not get wound dressing(s) wet. - May shower using cast protector bag. Edema Control - Lymphedema / SCD / Other: Elevate legs to  the level of the heart or above for 30 minutes daily and/or when sitting, a frequency of: - several times a day Avoid standing for long periods of time. Additional Orders / Instructions: Follow Nutritious Diet The following medication(s) was prescribed: lidocaine topical 4 % cream cream topical was prescribed at facility WOUND #3: - Lower Leg Wound Laterality: Right, Medial Cleanser: Soap and Water 1 x Per Week/30 Days Discharge Instructions: May shower and wash wound with dial antibacterial soap and water prior to dressing change. Cleanser: Wound Cleanser 1 x Per Week/30 Days Discharge Instructions: Cleanse the wound with wound cleanser prior to applying a clean dressing using gauze sponges, not tissue or cotton balls. Peri-Wound Care: Triamcinolone 15 (g) 1 x Per Week/30 Days Discharge Instructions: Use triamcinolone 15 (g) as directed Peri-Wound Care: Sween Lotion (Moisturizing lotion) 1 x Per Week/30 Days Discharge Instructions: Apply moisturizing lotion as directed Prim Dressing: Xeroform Occlusive Gauze Dressing, 4x4 in 1 x Per Week/30 Days ary Discharge Instructions: Apply to wound bed as instructed Secondary Dressing: ABD Pad, 5x9 1 x Per Week/30 Days Discharge Instructions: Apply over primary dressing as directed. Com pression Wrap: ThreePress (3 layer compression wrap) 1 x Per Week/30 Days Discharge Instructions: Apply three layer compression as directed. 1. In office sharp debridement 2. Xeroform 3. 3 layer compression 4. Follow-up in 1 week Electronic Signature(s) Signed: 07/06/2022 9:20:47 AM By: [name] Entered By: Kalman Shan on 07/05/2022 16:53:04 -------------------------------------------------------------------------------- HxROS Details Patient Name: Date of Service: [name], [name] 07/05/2022 3:30 PM Medical Record Number: [id number] Patient Account Number: [id number] Date of Birth/Sex: Treating RN: 10/10/1956 (66 y.o. [name] Primary  Care Provider: [name] Other Clinician: Referring Provider: Treating Provider/Extender: [name] in Treatment: 6 Information Obtained From Patient Chart Constitutional Symptoms (General Health) Medical History: Past Medical History Notes: obesity Eyes Medical History: Positive for: Cataracts; Glaucoma Negative for: Optic Neuritis Hematologic/Lymphatic Medical History: Positive for: Anemia Respiratory Medical History: Positive for: Asthma; Chronic Obstructive Pulmonary Disease (COPD); Sleep Apnea - sleeps in recliner Negative for: Pneumothorax Cardiovascular Medical History: Positive for: Arrhythmia - afib; Congestive Heart Failure; Deep Vein Thrombosis; Myocardial Infarction; Peripheral Venous Disease Past Medical History Notes: cardiomyopathy, pacemaker/defibulator Gastrointestinal Medical History: Past Medical History Notes: GERD Endocrine Medical History: Positive for: Type II Diabetes Negative for: Type I Diabetes Past Medical History Notes: thyrotoxicosis Time with diabetes: 10 yrs Treated with: Diet Blood sugar tested every day: No Genitourinary Medical History: Negative for: End Stage Renal Disease Past Medical History Notes: hx kidney stones, BPH Integumentary (Skin) Medical History: Negative for: History of Burn Musculoskeletal Medical History: Positive for: Osteoarthritis Past Medical History Notes: thoracic and lumbosacral spondylosis Oncologic Medical History: Negative for: Received Chemotherapy; Received Radiation Psychiatric Medical History: Negative for: Anorexia/bulimia; Confinement  Anxiety HBO Extended History Items Eyes: Eyes: Cataracts Glaucoma Immunizations Pneumococcal Vaccine: Received Pneumococcal Vaccination: Yes Received Pneumococcal Vaccination On or After 60th Birthday: Yes Implantable Devices Yes Family and Social History Cancer: No; Diabetes: No; Heart Disease: Yes - Father; Hereditary  Spherocytosis: No; Hypertension: Yes - Mother,Father,Siblings; Kidney Disease: No; Lung Disease: Yes - Mother; Seizures: No; Stroke: No; Thyroid Problems: Yes - Mother; Tuberculosis: No; Former smoker - quit 2009; Marital Status - Divorced; Alcohol Use: Never; Drug Use: No History; Caffeine Use: Daily - coffee, diet soda; Financial Concerns: No; Food, Clothing or Shelter Needs: No; Support System Lacking: No; Transportation Concerns: No Electronic Signature(s) Signed: 07/06/2022 9:20:47 AM By: [name] Signed: 07/11/2022 4:59:35 PM By: [name] Entered By: Kalman Shan on 07/05/2022 16:51:30 -------------------------------------------------------------------------------- SuperBill Details Patient Name: Date of Service: [name] RD L. 07/05/2022 Medical Record Number: [id number] Patient Account Number: [id number] Date of Birth/Sex: Treating RN: [birth year] (66 y.o. [name] Primary Care Provider: [name] Other Clinician: Referring Provider: Treating Provider/Extender: [name] in Treatment: 6 Diagnosis Coding ICD-10 Codes Code Description [phone] Chronic venous hypertension (idiopathic) with ulcer of right lower extremity L97.812 Non-pressure chronic ulcer of other part of right lower leg with fat layer exposed D56.86 Chronic systolic (congestive) heart failure I10 Essential (primary) hypertension I42.8 Other cardiomyopathies J44.9 Chronic obstructive pulmonary disease, unspecified I48.19 Other persistent atrial fibrillation E11.622 Type 2 diabetes mellitus with other skin ulcer T79.8XXA Other early complications of trauma, initial encounter Facility Procedures CPT4 Code: 16837290 Description: 21115 - DEB SUBQ TISSUE 20 SQ CM/< ICD-10 Diagnosis Description Z20.802 Non-pressure chronic ulcer of other part of right lower leg with fat layer exp Modifier: osed Quantity: 1 Physician Procedures : CPT4 Code Description  Modifier [id number] - WC PHYS SUBQ TISS 20 SQ CM ICD-10 Diagnosis Description P00.511 Non-pressure chronic ulcer of other part of right lower leg with fat layer exposed Quantity: 1 Electronic Signature(s) Signed: 07/06/2022 9:20:47 AM By: [name] Entered By: Kalman Shan on 07/05/2022 16:53:15

## 2022-07-11 NOTE — Progress Notes (Signed)
[name] ([id number]) Visit Report for 07/05/2022 Arrival Information Details Patient Name: Date of Service: [name] RD L. 07/05/2022 3:30 PM Medical Record Number: [id number] Patient Account Number: [id number] Date of Birth/Sex: Treating RN: 01-30-1956 (66 y.o. [name] Primary Care Jim Lundin: [name] Other Clinician: Referring Zahli Vetsch: Treating Jaivian Battaglini/Extender: [name] in Treatment: 6 Visit Information History Since Last Visit Added or deleted any medications: No Patient Arrived: [name] Any new allergies or adverse reactions: No Arrival Time: 15:32 Had a fall or experienced change in No Accompanied By: self activities of daily living that may affect Transfer Assistance: None risk of falls: Patient Identification Verified: Yes Signs or symptoms of abuse/neglect since last visito No Secondary Verification Process Completed: Yes Hospitalized since last visit: No Patient Requires Transmission-Based Precautions: No Implantable device outside of the clinic excluding No Patient Has Alerts: Yes cellular tissue based products placed in the center Patient Alerts: Patient on Blood Thinner since last visit: Pacemaker/Defibrillator Has Dressing in Place as Prescribed: Yes Has Compression in Place as Prescribed: Yes Pain Present Now: No Electronic Signature(s) Signed: 07/05/2022 4:57:37 PM By: Adline Peals Entered By: Adline Peals on 07/05/2022 15:33:39 -------------------------------------------------------------------------------- Encounter Discharge Information Details Patient Name: Date of Service: [name] RD L. 07/05/2022 3:30 PM Medical Record Number: [id number] Patient Account Number: [id number] Date of Birth/Sex: Treating RN: 1956/09/26 (66 y.o. [name] Primary Care Javier Mamone: [name] Other Clinician: Referring Vivan Agostino: Treating Dama Hedgepeth/Extender: [name] in  Treatment: 6 Encounter Discharge Information Items Post Procedure Vitals Discharge Condition: Stable Temperature (F): 98 Ambulatory Status: Cane Pulse (bpm): 79 Discharge Destination: Home Respiratory Rate (breaths/min): 18 Transportation: Private Auto Blood Pressure (mmHg): 128/77 Accompanied By: self Schedule Follow-up Appointment: Yes Clinical Summary of Care: Patient Declined Electronic Signature(s) Signed: 07/05/2022 4:57:37 PM By: Adline Peals Entered By: Adline Peals on 07/05/2022 16:39:28 -------------------------------------------------------------------------------- Lower Extremity Assessment Details Patient Name: Date of Service: [name] RD L. 07/05/2022 3:30 PM Medical Record Number: [id number] Patient Account Number: [id number] Date of Birth/Sex: Treating RN: [birth year] (66 y.o. [name] Primary Care Enrique Manganaro: [name] Other Clinician: Referring Adelise Buswell: Treating Mysha Peeler/Extender: [name] in Treatment: 6 Edema Assessment Assessed: [name]: No] [Right: No] Edema: [Left: Ye] [Right: s] Calf Left: Right: Point of Measurement: 36 cm From Medial Instep 34.5 cm Ankle Left: Right: Point of Measurement: 12 cm From Medial Instep 20.2 cm Vascular Assessment Pulses: Dorsalis Pedis Palpable: [Right:Yes] Electronic Signature(s) Signed: 07/05/2022 4:57:37 PM By: Adline Peals Entered By: Adline Peals on 07/05/2022 15:39:15 -------------------------------------------------------------------------------- Multi Wound Chart Details Patient Name: Date of Service: [name] RD L. 07/05/2022 3:30 PM Medical Record Number: [id number] Patient Account Number: [id number] Date of Birth/Sex: Treating RN: 03-Nov-1955 (66 y.o. [name] Primary Care Rexford Prevo: [name] Other Clinician: Referring Estill Llerena: Treating Hoy Fallert/Extender: [name] in Treatment: 6 Vital  Signs Height(in): 64 Pulse(bpm): 28 Weight(lbs): 465 Blood Pressure(mmHg): 128/77 Body Mass Index(BMI): 36.3 Temperature(F): 98 Respiratory Rate(breaths/min): 18 Photos: [N/A:N/A] Right, Medial Lower Leg N/A N/A Wound Location: Trauma N/A N/A Wounding Event: Venous Leg Ulcer N/A N/A Primary Etiology: Cataracts, Glaucoma, Anemia, N/A N/A Comorbid History: Asthma, Chronic Obstructive Pulmonary Disease (COPD), Sleep Apnea, Arrhythmia, Congestive Heart Failure, Deep Vein Thrombosis, Myocardial Infarction, Peripheral Venous Disease, Type II Diabetes, Osteoarthritis 04/15/2022 N/A N/A Date Acquired: 6 N/A N/A Weeks of Treatment: Open N/A N/A Wound Status: No N/A N/A Wound Recurrence: 0.7x0.7x0.1 N/A N/A Measurements L x W x D (cm)  0.385 N/A N/A A (cm) : rea 0.038 N/A N/A Volume (cm) : 87.10% N/A N/A % Reduction in A rea: 93.60% N/A N/A % Reduction in Volume: Full Thickness Without Exposed N/A N/A Classification: Support Structures Medium N/A N/A Exudate A mount: Serosanguineous N/A N/A Exudate Type: red, brown N/A N/A Exudate Color: Distinct, outline attached N/A N/A Wound Margin: Small (1-33%) N/A N/A Granulation A mount: Red N/A N/A Granulation Quality: Large (67-100%) N/A N/A Necrotic A mount: Eschar, Adherent Slough N/A N/A Necrotic Tissue: Fat Layer (Subcutaneous Tissue): Yes N/A N/A Exposed Structures: Fascia: No Tendon: No Muscle: No Joint: No Bone: No Large (67-100%) N/A N/A Epithelialization: Debridement - Excisional N/A N/A Debridement: Pre-procedure Verification/Time Out 16:10 N/A N/A Taken: Lidocaine 4% Topical Solution N/A N/A Pain Control: Subcutaneous, Slough N/A N/A Tissue Debrided: Skin/Subcutaneous Tissue N/A N/A Level: 0.49 N/A N/A Debridement A (sq cm): rea Curette N/A N/A Instrument: Minimum N/A N/A Bleeding: Pressure N/A N/A Hemostasis A chieved: 0 N/A N/A Procedural Pain: 0 N/A N/A Post Procedural  Pain: Procedure was tolerated well N/A N/A Debridement Treatment Response: 0.7x0.7x0.1 N/A N/A Post Debridement Measurements L x W x D (cm) 0.038 N/A N/A Post Debridement Volume: (cm) Debridement N/A N/A Procedures Performed: Treatment Notes Wound #3 (Lower Leg) Wound Laterality: Right, Medial Cleanser Soap and Water Discharge Instruction: May shower and wash wound with dial antibacterial soap and water prior to dressing change. Wound Cleanser Discharge Instruction: Cleanse the wound with wound cleanser prior to applying a clean dressing using gauze sponges, not tissue or cotton balls. Peri-Wound Care Triamcinolone 15 (g) Discharge Instruction: Use triamcinolone 15 (g) as directed Sween Lotion (Moisturizing lotion) Discharge Instruction: Apply moisturizing lotion as directed Topical Primary Dressing Xeroform Occlusive Gauze Dressing, 4x4 in Discharge Instruction: Apply to wound bed as instructed Secondary Dressing ABD Pad, 5x9 Discharge Instruction: Apply over primary dressing as directed. Secured With Compression Wrap ThreePress (3 layer compression wrap) Discharge Instruction: Apply three layer compression as directed. Compression Stockings Add-Ons Electronic Signature(s) Signed: 07/06/2022 9:20:47 AM By: [name] Signed: 07/11/2022 4:59:35 PM By: Fara Chute By: Kalman Shan on 07/05/2022 16:50:32 -------------------------------------------------------------------------------- Multi-Disciplinary Care Plan Details Patient Name: Date of Service: [name], [name] RD L. 07/05/2022 3:30 PM Medical Record Number: [id number] Patient Account Number: [id number] Date of Birth/Sex: Treating RN: 17-Sep-1956 (66 y.o. [name] Primary Care Xaiden Fleig: [name] Other Clinician: Referring Karon Cotterill: Treating Justyna Timoney/Extender: [name] in Treatment: 6 Active Inactive Wound/Skin Impairment Nursing Diagnoses: Impaired  tissue integrity Goals: Patient/caregiver will verbalize understanding of skin care regimen Date Initiated: 05/24/2022 Target Resolution Date: 07/19/2022 Goal Status: Active Ulcer/skin breakdown will have a volume reduction of 30% by week 4 Date Initiated: 05/24/2022 Date Inactivated: 06/21/2022 Target Resolution Date: 06/21/2022 Goal Status: Met Ulcer/skin breakdown will have a volume reduction of 50% by week 8 Date Initiated: 06/21/2022 Target Resolution Date: 07/19/2022 Goal Status: Active Interventions: Assess patient/caregiver ability to obtain necessary supplies Assess patient/caregiver ability to perform ulcer/skin care regimen upon admission and as needed Provide education on ulcer and skin care Treatment Activities: Topical wound management initiated : 05/24/2022 Notes: 06/21/22: Wound care regimen continues. Electronic Signature(s) Signed: 07/05/2022 4:57:37 PM By: Sabas Sous By: Adline Peals on 07/05/2022 15:39:54 -------------------------------------------------------------------------------- Pain Assessment Details Patient Name: Date of Service: [name] RD L. 07/05/2022 3:30 PM Medical Record Number: [id number] Patient Account Number: [id number] Date of Birth/Sex: Treating RN: Jun 15, 1956 (66 y.o. [name] Primary Care Juda Toepfer: [name] Other Clinician: Referring Rafferty Postlewait: Treating Corderius Saraceni/Extender:  Dub Mikes Weeks in Treatment: 6 Active Problems Location of Pain Severity and Description of Pain Patient Has Paino No Site Locations Rate the pain. Current Pain Level: 0 Pain Management and Medication Current Pain Management: Electronic Signature(s) Signed: 07/05/2022 4:57:37 PM By: Adline Peals Entered By: Adline Peals on 07/05/2022 15:34:03 -------------------------------------------------------------------------------- Patient/Caregiver Education Details Patient Name: Date of Service: [name] RD L. 9/21/2023andnbsp3:30 PM Medical Record Number: [id number] Patient Account Number: [id number] Date of Birth/Gender: Treating RN: [birth year] (66 y.o. [name] Primary Care Physician: [name] Other Clinician: Referring Physician: Treating Physician/Extender: [name] in Treatment: 6 Education Assessment Education Provided To: Patient Education Topics Provided Wound/Skin Impairment: Methods: Explain/Verbal Responses: Reinforcements needed, State content correctly Electronic Signature(s) Signed: 07/05/2022 4:57:37 PM By: Adline Peals Entered By: Adline Peals on 07/05/2022 15:40:05 -------------------------------------------------------------------------------- Wound Assessment Details Patient Name: Date of Service: [name] RD L. 07/05/2022 3:30 PM Medical Record Number: [id number] Patient Account Number: [id number] Date of Birth/Sex: Treating RN: 1956/01/01 (66 y.o. [name] Primary Care Cabe Lashley: [name] Other Clinician: Referring Tonyetta Berko: Treating Laiyah Exline/Extender: [name] in Treatment: 6 Wound Status Wound Number: 3 Primary Venous Leg Ulcer Etiology: Wound Location: Right, Medial Lower Leg Wound Open Wounding Event: Trauma Status: Date Acquired: 04/15/2022 Comorbid Cataracts, Glaucoma, Anemia, Asthma, Chronic Obstructive Weeks Of Treatment: 6 History: Pulmonary Disease (COPD), Sleep Apnea, Arrhythmia, Congestive Clustered Wound: No Heart Failure, Deep Vein Thrombosis, Myocardial Infarction, Peripheral Venous Disease, Type II Diabetes, Osteoarthritis Photos Wound Measurements Length: (cm) 0.7 Width: (cm) 0.7 Depth: (cm) 0.1 Area: (cm) 0.385 Volume: (cm) 0.038 % Reduction in Area: 87.1% % Reduction in Volume: 93.6% Epithelialization: Large (67-100%) Tunneling: No Undermining: No Wound Description Classification: Full Thickness Without Exposed Support  Structures Wound Margin: Distinct, outline attached Exudate Amount: Medium Exudate Type: Serosanguineous Exudate Color: red, brown Foul Odor After Cleansing: No Slough/Fibrino Yes Wound Bed Granulation Amount: Small (1-33%) Exposed Structure Granulation Quality: Red Fascia Exposed: No Necrotic Amount: Large (67-100%) Fat Layer (Subcutaneous Tissue) Exposed: Yes Necrotic Quality: Eschar, Adherent Slough Tendon Exposed: No Muscle Exposed: No Joint Exposed: No Bone Exposed: No Treatment Notes Wound #3 (Lower Leg) Wound Laterality: Right, Medial Cleanser Soap and Water Discharge Instruction: May shower and wash wound with dial antibacterial soap and water prior to dressing change. Wound Cleanser Discharge Instruction: Cleanse the wound with wound cleanser prior to applying a clean dressing using gauze sponges, not tissue or cotton balls. Peri-Wound Care Triamcinolone 15 (g) Discharge Instruction: Use triamcinolone 15 (g) as directed Sween Lotion (Moisturizing lotion) Discharge Instruction: Apply moisturizing lotion as directed Topical Primary Dressing Xeroform Occlusive Gauze Dressing, 4x4 in Discharge Instruction: Apply to wound bed as instructed Secondary Dressing ABD Pad, 5x9 Discharge Instruction: Apply over primary dressing as directed. Secured With Compression Wrap ThreePress (3 layer compression wrap) Discharge Instruction: Apply three layer compression as directed. Compression Stockings Add-Ons Electronic Signature(s) Signed: 07/05/2022 4:57:37 PM By: Adline Peals Entered By: Adline Peals on 07/05/2022 15:41:03 -------------------------------------------------------------------------------- Vitals Details Patient Name: Date of Service: [name], [name] RD L. 07/05/2022 3:30 PM Medical Record Number: [id number] Patient Account Number: [id number] Date of Birth/Sex: Treating RN: 01-Feb-1956 (66 y.o. [name] Primary Care Athena Baltz: [name] Other Clinician: Referring Mardene Lessig: Treating Veralyn Lopp/Extender: [name] in Treatment: 6 Vital Signs Time Taken: 15:33 Temperature (F): 98 Height (in): 70 Pulse (bpm): 79 Weight (lbs): 253 Respiratory Rate (breaths/min): 18 Body Mass Index (BMI): 36.3 Blood Pressure (mmHg): 128/77 Reference Range:  80 - 120 mg / dl Electronic Signature(s) Signed: 07/05/2022 4:57:37 PM By: Adline Peals Entered By: Adline Peals on 07/05/2022 15:33:55

## 2022-07-16 ENCOUNTER — Encounter (HOSPITAL_BASED_OUTPATIENT_CLINIC_OR_DEPARTMENT_OTHER): Payer: Medicare HMO | Attending: Internal Medicine | Admitting: Internal Medicine

## 2022-07-16 DIAGNOSIS — I110 Hypertensive heart disease with heart failure: Secondary | ICD-10-CM | POA: Diagnosis not present

## 2022-07-16 DIAGNOSIS — I872 Venous insufficiency (chronic) (peripheral): Secondary | ICD-10-CM | POA: Insufficient documentation

## 2022-07-16 DIAGNOSIS — I428 Other cardiomyopathies: Secondary | ICD-10-CM | POA: Insufficient documentation

## 2022-07-16 DIAGNOSIS — I87311 Chronic venous hypertension (idiopathic) with ulcer of right lower extremity: Secondary | ICD-10-CM | POA: Insufficient documentation

## 2022-07-16 DIAGNOSIS — E11622 Type 2 diabetes mellitus with other skin ulcer: Secondary | ICD-10-CM | POA: Diagnosis not present

## 2022-07-16 DIAGNOSIS — J449 Chronic obstructive pulmonary disease, unspecified: Secondary | ICD-10-CM | POA: Diagnosis not present

## 2022-07-16 DIAGNOSIS — I4819 Other persistent atrial fibrillation: Secondary | ICD-10-CM | POA: Insufficient documentation

## 2022-07-16 DIAGNOSIS — W19XXXA Unspecified fall, initial encounter: Secondary | ICD-10-CM | POA: Diagnosis not present

## 2022-07-16 DIAGNOSIS — L97812 Non-pressure chronic ulcer of other part of right lower leg with fat layer exposed: Secondary | ICD-10-CM | POA: Diagnosis not present

## 2022-07-16 DIAGNOSIS — I5022 Chronic systolic (congestive) heart failure: Secondary | ICD-10-CM | POA: Diagnosis not present

## 2022-07-16 DIAGNOSIS — T798XXA Other early complications of trauma, initial encounter: Secondary | ICD-10-CM | POA: Diagnosis not present

## 2022-07-16 DIAGNOSIS — Z87891 Personal history of nicotine dependence: Secondary | ICD-10-CM | POA: Diagnosis not present

## 2022-07-16 NOTE — Progress Notes (Signed)
[name], [name] ([id number]) Visit Report for 07/16/2022 Arrival Information Details Patient Name: Date of Service: [name] RD L. 07/16/2022 3:30 PM Medical Record Number: [id number] Patient Account Number: [id number] Date of Birth/Sex: Treating RN: [birth year] (66 y.o. [name] Primary Care Myeesha Shane: [name] Other Clinician: Referring Arietta Eisenstein: Treating Margerite Impastato/Extender: [name] in Treatment: 7 Visit Information History Since Last Visit Added or deleted any medications: No Patient Arrived: [name] Any new allergies or adverse reactions: No Arrival Time: 15:30 Had a fall or experienced change in No Accompanied By: self activities of daily living that may affect Transfer Assistance: None risk of falls: Patient Identification Verified: Yes Signs or symptoms of abuse/neglect since last visito No Secondary Verification Process Completed: Yes Hospitalized since last visit: No Patient Requires Transmission-Based Precautions: No Implantable device outside of the clinic excluding No Patient Has Alerts: Yes cellular tissue based products placed in the center Patient Alerts: Patient on Blood Thinner since last visit: Pacemaker/Defibrillator Has Dressing in Place as Prescribed: Yes Has Compression in Place as Prescribed: Yes Pain Present Now: No Electronic Signature(s) Signed: 07/16/2022 4:42:08 PM By: Deon Pilling RN, BSN Entered By: Deon Pilling on 07/16/2022 15:31:29 -------------------------------------------------------------------------------- Compression Therapy Details Patient Name: Date of Service: [name] RD L. 07/16/2022 3:30 PM Medical Record Number: [id number] Patient Account Number: [id number] Date of Birth/Sex: Treating RN: [birth year] (66 y.o. [name] Primary Care Lenton Gendreau: [name] Other Clinician: Referring Gladyse Corvin: Treating Tenishia Ekman/Extender: [name] in Treatment: 7 Compression  Therapy Performed for Wound Assessment: Wound #3 Right,Medial Lower Leg Performed By: Clinician [name], RN Compression Type: Three Layer Post Procedure Diagnosis Same as Pre-procedure Electronic Signature(s) Signed: 07/16/2022 4:30:21 PM By: [name] Entered By: [name] on 07/16/2022 16:00:08 -------------------------------------------------------------------------------- Encounter Discharge Information Details Patient Name: Date of Service: [name], [name] RD L. 07/16/2022 3:30 PM Medical Record Number: [id number] Patient Account Number: [id number] Date of Birth/Sex: Treating RN: July 25, 1956 (66 y.o. [name] Primary Care Nimo Verastegui: [name] Other Clinician: Referring Jeraldean Wechter: Treating Angel Hobdy/Extender: [name] in Treatment: 7 Encounter Discharge Information Items Discharge Condition: Stable Ambulatory Status: [place] Discharge Destination: Home Transportation: Private Auto Schedule Follow-up Appointment: Yes Clinical Summary of Care: Provided on 07/16/2022 Form Type Recipient Paper Patient Patient Electronic Signature(s) Signed: 07/16/2022 4:30:21 PM By: [name] Entered By: [name] on 07/16/2022 16:18:33 -------------------------------------------------------------------------------- Lower Extremity Assessment Details Patient Name: Date of Service: [name] RD L. 07/16/2022 3:30 PM Medical Record Number: [id number] Patient Account Number: [id number] Date of Birth/Sex: Treating RN: Jan 09, 1956 (66 y.o. [name] Primary Care Serene Kopf: [name] Other Clinician: Referring Kerrigan Glendening: Treating Kaleiyah Polsky/Extender: [name] in Treatment: 7 Edema Assessment Assessed: [name]: No] [Right: Yes] Edema: [Left: N] [Right: o] Calf Left: Right: Point of Measurement: 36 cm From Medial Instep 30 cm Ankle Left: Right: Point of Measurement: 12 cm From Medial Instep 20 cm Vascular  Assessment Pulses: Dorsalis Pedis Palpable: [Right:Yes] Electronic Signature(s) Signed: 07/16/2022 4:42:08 PM By: Deon Pilling RN, BSN Entered By: Deon Pilling on 07/16/2022 15:34:51 -------------------------------------------------------------------------------- Multi Wound Chart Details Patient Name: Date of Service: [name] RD L. 07/16/2022 3:30 PM Medical Record Number: [id number] Patient Account Number: [id number] Date of Birth/Sex: Treating RN: [birth year] (66 y.o. M) Primary Care Michaeleen Down: [name] Other Clinician: Referring Tressie Ragin: Treating Nusayba Cadenas/Extender: [name] in Treatment: 7 Vital Signs Height(in): 70 Pulse(bpm): 73 Weight(lbs): 195 Blood Pressure(mmHg): 108/68 Body Mass Index(BMI): 36.3 Temperature(F): 98.2  Respiratory Rate(breaths/min): 20 Photos: [N/A:N/A] Right, Medial Lower Leg N/A N/A Wound Location: Trauma N/A N/A Wounding Event: Venous Leg Ulcer N/A N/A Primary Etiology: Cataracts, Glaucoma, Anemia, N/A N/A Comorbid History: Asthma, Chronic Obstructive Pulmonary Disease (COPD), Sleep Apnea, Arrhythmia, Congestive Heart Failure, Deep Vein Thrombosis, Myocardial Infarction, Peripheral Venous Disease, Type II Diabetes, Osteoarthritis 04/15/2022 N/A N/A Date Acquired: 7 N/A N/A Weeks of Treatment: Open N/A N/A Wound Status: No N/A N/A Wound Recurrence: 0.4x0.3x0.1 N/A N/A Measurements L x W x D (cm) 0.094 N/A N/A A (cm) : rea 0.009 N/A N/A Volume (cm) : 96.90% N/A N/A % Reduction in Area: 98.50% N/A N/A % Reduction in Volume: Full Thickness Without Exposed N/A N/A Classification: Support Structures Medium N/A N/A Exudate Amount: Serosanguineous N/A N/A Exudate Type: red, brown N/A N/A Exudate Color: Distinct, outline attached N/A N/A Wound Margin: Small (1-33%) N/A N/A Granulation Amount: Red N/A N/A Granulation Quality: Large (67-100%) N/A N/A Necrotic Amount: Fat Layer  (Subcutaneous Tissue): Yes N/A N/A Exposed Structures: Fascia: No Tendon: No Muscle: No Joint: No Bone: No Large (67-100%) N/A N/A Epithelialization: Excoriation: No N/A N/A Periwound Skin Texture: Induration: No Callus: No Crepitus: No Rash: No Scarring: No Maceration: No N/A N/A Periwound Skin Moisture: Dry/Scaly: No Atrophie Blanche: No N/A N/A Periwound Skin Color: Cyanosis: No Ecchymosis: No Erythema: No Hemosiderin Staining: No Mottled: No Pallor: No Rubor: No No Abnormality N/A N/A Temperature: Compression Therapy N/A N/A Procedures Performed: Treatment Notes Electronic Signature(s) Signed: 07/16/2022 4:32:49 PM By: [name] Entered By: Kalman Shan on 07/16/2022 16:16:16 -------------------------------------------------------------------------------- Multi-Disciplinary Care Plan Details Patient Name: Date of Service: [name] RD L. 07/16/2022 3:30 PM Medical Record Number: [id number] Patient Account Number: [id number] Date of Birth/Sex: Treating RN: 24-Nov-1955 (66 y.o. [name] Primary Care Fielding Mault: [name] Other Clinician: Referring Asucena Galer: Treating Michaelpaul Apo/Extender: [name] in Treatment: 7 Active Inactive Wound/Skin Impairment Nursing Diagnoses: Impaired tissue integrity Goals: Patient/caregiver will verbalize understanding of skin care regimen Date Initiated: 05/24/2022 Target Resolution Date: 07/19/2022 Goal Status: Active Ulcer/skin breakdown will have a volume reduction of 30% by week 4 Date Initiated: 05/24/2022 Date Inactivated: 06/21/2022 Target Resolution Date: 06/21/2022 Goal Status: Met Ulcer/skin breakdown will have a volume reduction of 50% by week 8 Date Initiated: 06/21/2022 Target Resolution Date: 07/19/2022 Goal Status: Active Interventions: Assess patient/caregiver ability to obtain necessary supplies Assess patient/caregiver ability to perform ulcer/skin care regimen upon  admission and as needed Provide education on ulcer and skin care Treatment Activities: Topical wound management initiated : 05/24/2022 Notes: 06/21/22: Wound care regimen continues. Electronic Signature(s) Signed: 07/16/2022 3:45:45 PM By: [name] Entered By: [name] on 07/16/2022 15:45:45 -------------------------------------------------------------------------------- Pain Assessment Details Patient Name: Date of Service: [name] RD L. 07/16/2022 3:30 PM Medical Record Number: [id number] Patient Account Number: [id number] Date of Birth/Sex: Treating RN: 1956/06/10 (66 y.o. [name] Primary Care Jalien Weakland: [name] Other Clinician: Referring [name] Mo: Treating Rutledge Selsor/Extender: [name] in Treatment: 7 Active Problems Location of Pain Severity and Description of Pain Patient Has Paino No Site Locations Rate the pain. Current Pain Level: 0 Pain Management and Medication Current Pain Management: Medication: No Cold Application: No Rest: No Massage: No Activity: No T.E.N.S.: No Heat Application: No Leg drop or elevation: No Is the Current Pain Management Adequate: Adequate How does your wound impact your activities of daily livingo Sleep: No Bathing: No Appetite: No Relationship With Others: No Bladder Continence: No Emotions: No Bowel Continence: No Work: No  Toileting: No Drive: No Dressing: No Hobbies: No Electronic Signature(s) Signed: 07/16/2022 4:42:08 PM By: Deon Pilling RN, BSN Entered By: Deon Pilling on 07/16/2022 15:31:43 -------------------------------------------------------------------------------- Patient/Caregiver Education Details Patient Name: Date of Service: [name] RD L. 10/2/2023andnbsp3:30 PM Medical Record Number: [id number] Patient Account Number: [id number] Date of Birth/Gender: Treating RN: Jun 01, 1956 (66 y.o. [name] Primary Care Physician: [name] Other  Clinician: Referring Physician: Treating Physician/Extender: [name] in Treatment: 7 Education Assessment Education Provided To: Patient Education Topics Provided Venous: Methods: Explain/Verbal, Printed Responses: State content correctly Wound/Skin Impairment: Methods: Explain/Verbal, Printed Responses: State content correctly Electronic Signature(s) Signed: 07/16/2022 4:30:21 PM By: [name] Entered By: [name] on 07/16/2022 15:46:32 -------------------------------------------------------------------------------- Wound Assessment Details Patient Name: Date of Service: [name] RD L. 07/16/2022 3:30 PM Medical Record Number: [id number] Patient Account Number: [id number] Date of Birth/Sex: Treating RN: February 04, 1956 (66 y.o. [name], Meta.Reding Primary Care Bristyl Mclees: [name] Other Clinician: Referring Daaron Dimarco: Treating Curren Mohrmann/Extender: [name] in Treatment: 7 Wound Status Wound Number: 3 Primary Venous Leg Ulcer Etiology: Wound Location: Right, Medial Lower Leg Wound Open Wounding Event: Trauma Status: Date Acquired: 04/15/2022 Comorbid Cataracts, Glaucoma, Anemia, Asthma, Chronic Obstructive Weeks Of Treatment: 7 History: Pulmonary Disease (COPD), Sleep Apnea, Arrhythmia, Congestive Clustered Wound: No Heart Failure, Deep Vein Thrombosis, Myocardial Infarction, Peripheral Venous Disease, Type II Diabetes, Osteoarthritis Photos Wound Measurements Length: (cm) 0.4 Width: (cm) 0.3 Depth: (cm) 0.1 Area: (cm) 0.094 Volume: (cm) 0.009 % Reduction in Area: 96.9% % Reduction in Volume: 98.5% Epithelialization: Large (67-100%) Tunneling: No Undermining: No Wound Description Classification: Full Thickness Without Exposed Support Structures Wound Margin: Distinct, outline attached Exudate Amount: Medium Exudate Type: Serosanguineous Exudate Color: red, brown Foul Odor After Cleansing:  No Slough/Fibrino Yes Wound Bed Granulation Amount: Small (1-33%) Exposed Structure Granulation Quality: Red Fascia Exposed: No Necrotic Amount: Large (67-100%) Fat Layer (Subcutaneous Tissue) Exposed: Yes Necrotic Quality: Adherent Slough Tendon Exposed: No Muscle Exposed: No Joint Exposed: No Bone Exposed: No Periwound Skin Texture Texture Color No Abnormalities Noted: Yes No Abnormalities Noted: Yes Moisture Temperature / Pain No Abnormalities Noted: Yes Temperature: No Abnormality Treatment Notes Wound #3 (Lower Leg) Wound Laterality: Right, Medial Cleanser Soap and Water Discharge Instruction: May shower and wash wound with dial antibacterial soap and water prior to dressing change. Wound Cleanser Discharge Instruction: Cleanse the wound with wound cleanser prior to applying a clean dressing using gauze sponges, not tissue or cotton balls. Peri-Wound Care Triamcinolone 15 (g) Discharge Instruction: Use triamcinolone 15 (g) as directed Sween Lotion (Moisturizing lotion) Discharge Instruction: Apply moisturizing lotion as directed Topical Primary Dressing Hydrofera Blue Ready Foam, 2.5 x2.5 in Discharge Instruction: Apply to wound bed as instructed Secondary Dressing ABD Pad, 5x9 Discharge Instruction: Apply over primary dressing as directed. Secured With Compression Wrap ThreePress (3 layer compression wrap) Discharge Instruction: Apply three layer compression as directed. Compression Stockings Add-Ons Electronic Signature(s) Signed: 07/16/2022 4:42:08 PM By: Deon Pilling RN, BSN Entered By: Deon Pilling on 07/16/2022 15:38:02 -------------------------------------------------------------------------------- Vitals Details Patient Name: Date of Service: [name], [place]. 07/16/2022 3:30 PM Medical Record Number: [id number] Patient Account Number: [id number] Date of Birth/Sex: Treating RN: 1956/02/10 (66 y.o. [name] Primary Care Young Mulvey: [name] Other Clinician: Referring Colten Desroches: Treating K[place]a Phillippi/Extender: [name] in Treatment: 7 Vital Signs Time Taken: 15:35 Temperature (F): 98.2 Height (in): 70 Pulse (bpm): 73 Weight (lbs): 253 Respiratory Rate (breaths/min): 20 Body Mass Index (BMI): 36.3 Blood  Pressure (mmHg): 108/68 Reference Range: 80 - 120 mg / dl Electronic Signature(s) Signed: 07/16/2022 4:42:08 PM By: Deon Pilling RN, BSN Entered By: Deon Pilling on 07/16/2022 15:38:48

## 2022-07-16 NOTE — Progress Notes (Signed)
[name] ([id number]) Visit Report for 07/16/2022 Chief Complaint Document Details Patient Name: Date of Service: [name] RD L. 07/16/2022 3:30 PM Medical Record Number: [id number] Patient Account Number: [id number] Date of Birth/Sex: Treating RN: 01/29/1956 (66 y.o. M) Primary Care Provider: [name] Other Clinician: Referring Provider: Treating Provider/Extender: [name] in Treatment: 7 Information Obtained from: Patient Chief Complaint 05/24/2022; right lower extremity wound Electronic Signature(s) Signed: 07/16/2022 4:32:49 PM By: [name] Entered By: Kalman Shan on 07/16/2022 16:16:27 -------------------------------------------------------------------------------- HPI Details Patient Name: Date of Service: [name] RD L. 07/16/2022 3:30 PM Medical Record Number: [id number] Patient Account Number: [id number] Date of Birth/Sex: Treating RN: [birth year] (66 y.o. M) Primary Care Provider: [name] Other Clinician: Referring Provider: Treating Provider/Extender: [name] in Treatment: 7 History of Present Illness HPI Description: Admission 5/2 [name] is a 65 year old male with a past medical history of systolic congestive heart failure, COPD, nonischemic cardiomyopathy, type 2 diabetes, chronic venous insufficiency that presents to the clinic for a 59-monthhistory of nonhealing wound to his left lower extremity. He states that in November 2021 he fell off his porch and Hit his leg against a brick. The wound has healed mostly except for 1 area that is still open. He reports moderate Serosanguineous drainage daily. He has been keeping it covered with Band-Aids. He reports 2 rounds of antibiotics for this issue. He denies pain. He denies current acute signs of infection. 5/9; patient presents for 1 week follow-up. He has been using Hydrofera Blue under 3 layer compression. He has no issues or  complaints today. He denies signs of infection. 5/16; patient presents for 1 week follow-up. He has been using Hydrofera Blue under 3 layer compression. He has no issues or complaints today. He denies signs of infection. He states he overall feels well 5/23; patient presents for 1 week follow-up. He has been using Hydrofera Blue under 3 layer compression. He has no issues or complaints today. He denies signs of infection. 6/6; patient presents for 2-week follow-up. He has been using Hydrofera Blue under 3 layer compression. He has had no issues with this. He denies any signs of infection and has no complaints today. Readmissiondifferent wound 6/27 patient developed another wound to his right lower extremity. He states that 1 week ago he had a large admission for a different wound Paining that was hung over his couch fell on his leg. He has been using alcohol and keeping the area covered. He denies signs of infection. 7/5; patient presents for 1 week follow-up. He has been tolerating Hydrofera Blue under 3 layer compression. He does report some pain to the wound site. He denies signs of infection. 7/11; patient presents for 1 week follow-up. He has been doing well with Hydrofera Blue under 3 layer compression. He reports improvement in pain since last clinic visit. He has no complaints today. He denies signs of infection. 7/18; patient presents for 1 week follow-up. Has been doing well with Hydrofera Blue under 3 layer compression. He denies signs of infection. Admission 05/24/2022 Mr. RAlexandra Posadasis a 66year old male with a past medical history of venous insufficiency that presents to the clinic for a 1 month history of nonhealing wound to his right lower extremity. He states that a wagon wheel hit it and caused a wound. He has been treated with Keflex for this issue By his podiatrist. He has been using silver alginate to the wound bed with dressing changes.  He currently denies signs of  infection. He is not wearing compression stockings. 8/15; patient presents for follow-up. We have been using antibiotic ointment with Hydrofera Blue under 3 layer compression. He tolerated the compression wrap well. He has no issues or complaints today. He denies signs of infection. 8/22; patient presents for follow-up. We have been using Santyl and Hydrofera Blue under 3 layer compression. He has no issues or complaints today. 8/29; patient presents for follow-up. We have been using collagen to the wound bed under 3 layer compression. He has no issues or complaints today. 9/7; patient presents for follow-up. We have been using collagen under 3 layer compression. Patient has no issues or complaints today. 9/12; patient presents for follow-up. We have been using collagen under 3 layer compression. Patient has no issues or complaints today. 9/21; patient presents for follow-up. We have been using endoform under 3 layer compression. Patient has no issues or complaints today. He tolerated the wrap well. 10/2; patient presents for follow-up. We have been using Xeroform under 3 layer compression. The wound is almost healed. Patient denies signs of infection. Electronic Signature(s) Signed: 07/16/2022 4:32:49 PM By: [name] Entered By: Kalman Shan on 07/16/2022 16:26:08 -------------------------------------------------------------------------------- Physical Exam Details Patient Name: Date of Service: [name] RD L. 07/16/2022 3:30 PM Medical Record Number: [id number] Patient Account Number: [id number] Date of Birth/Sex: Treating RN: 25-Sep-1956 (66 y.o. M) Primary Care Provider: [name] Other Clinician: Referring Provider: Treating Provider/Extender: [name] in Treatment: 7 Constitutional respirations regular, non-labored and within target range for patient.. Cardiovascular 2+ dorsalis pedis/posterior tibialis pulses. Psychiatric pleasant and  cooperative. Notes Right lower extremity: T the proximal medial aspect there is an open wound with granulation tissue present. Venous stasis dermatitis. Good edema control. o Electronic Signature(s) Signed: 07/16/2022 4:32:49 PM By: [name] Entered By: Kalman Shan on 07/16/2022 16:27:01 -------------------------------------------------------------------------------- Physician Orders Details Patient Name: Date of Service: [name] RD L. 07/16/2022 3:30 PM Medical Record Number: [id number] Patient Account Number: [id number] Date of Birth/Sex: Treating RN: 12/22/1955 (66 y.o. [name] Primary Care Provider: [name] Other Clinician: Referring Provider: Treating Provider/Extender: [name] in Treatment: 7 Verbal / Phone Orders: No Diagnosis Coding Follow-up Appointments ppointment in 1 week. - with Dr. Heber [place] and Leveda Anna, RN (Room 7) Return A Anesthetic (In clinic) Topical Lidocaine 5% applied to wound bed (In clinic) Topical Lidocaine 4% applied to wound bed Cellular or Tissue Based Products Other Cellular or Tissue Based Products Orders/Instructions: - IVR for [employer] May shower with protection but do not get wound dressing(s) wet. - May shower using cast protector bag. Edema Control - Lymphedema / SCD / Other Elevate legs to the level of the heart or above for 30 minutes daily and/or when sitting, a frequency of: - several times a day Avoid standing for long periods of time. Additional Orders / Instructions Follow Nutritious Diet Wound Treatment Wound #3 - Lower Leg Wound Laterality: Right, Medial Cleanser: Soap and Water 1 x Per Week/30 Days Discharge Instructions: May shower and wash wound with dial antibacterial soap and water prior to dressing change. Cleanser: Wound Cleanser 1 x Per Week/30 Days Discharge Instructions: Cleanse the wound with wound cleanser prior to applying a clean dressing using  gauze sponges, not tissue or cotton balls. Peri-Wound Care: Triamcinolone 15 (g) 1 x Per Week/30 Days Discharge Instructions: Use triamcinolone 15 (g) as directed Peri-Wound Care: Sween Lotion (Moisturizing lotion) 1 x Per  Week/30 Days Discharge Instructions: Apply moisturizing lotion as directed Prim Dressing: Hydrofera Blue Ready Foam, 2.5 x2.5 in 1 x Per Week/30 Days ary Discharge Instructions: Apply to wound bed as instructed Secondary Dressing: ABD Pad, 5x9 1 x Per Week/30 Days Discharge Instructions: Apply over primary dressing as directed. Compression Wrap: ThreePress (3 layer compression wrap) 1 x Per Week/30 Days Discharge Instructions: Apply three layer compression as directed. Electronic Signature(s) Signed: 07/16/2022 4:32:49 PM By: [name] Entered By: Kalman Shan on 07/16/2022 16:27:08 -------------------------------------------------------------------------------- Problem List Details Patient Name: Date of Service: [name] RD L. 07/16/2022 3:30 PM Medical Record Number: [id number] Patient Account Number: [id number] Date of Birth/Sex: Treating RN: 1956/07/21 (66 y.o. M) Primary Care Provider: [name] Other Clinician: Referring Provider: Treating Provider/Extender: [name] in Treatment: 7 Active Problems ICD-10 Encounter Code Description Active Date MDM Diagnosis I87.311 Chronic venous hypertension (idiopathic) with ulcer of right lower extremity 05/24/2022 No Yes L97.812 Non-pressure chronic ulcer of other part of right lower leg with fat layer 05/24/2022 No Yes exposed K27.06 Chronic systolic (congestive) heart failure 05/24/2022 No Yes I10 Essential (primary) hypertension 05/24/2022 No Yes I42.8 Other cardiomyopathies 05/24/2022 No Yes J44.9 Chronic obstructive pulmonary disease, unspecified 05/24/2022 No Yes I48.19 Other persistent atrial fibrillation 05/24/2022 No Yes E11.622 Type 2 diabetes mellitus with other skin  ulcer 05/24/2022 No Yes T79.8XXA Other early complications of trauma, initial encounter 05/24/2022 No Yes Inactive Problems Resolved Problems Electronic Signature(s) Signed: 07/16/2022 4:32:49 PM By: [name] Entered By: Kalman Shan on 07/16/2022 16:16:10 -------------------------------------------------------------------------------- Progress Note Details Patient Name: Date of Service: [name] RD L. 07/16/2022 3:30 PM Medical Record Number: [id number] Patient Account Number: [id number] Date of Birth/Sex: Treating RN: [birth year] (66 y.o. M) Primary Care Provider: [name] Other Clinician: Referring Provider: Treating Provider/Extender: [name] in Treatment: 7 Subjective Chief Complaint Information obtained from Patient 05/24/2022; right lower extremity wound History of Present Illness (HPI) Admission 5/2 Mr. Meuth is a 66 year old male with a past medical history of systolic congestive heart failure, COPD, nonischemic cardiomyopathy, type 2 diabetes, chronic venous insufficiency that presents to the clinic for a 3-monthhistory of nonhealing wound to his left lower extremity. He states that in November 2021 he fell off his porch and Hit his leg against a brick. The wound has healed mostly except for 1 area that is still open. He reports moderate Serosanguineous drainage daily. He has been keeping it covered with Band-Aids. He reports 2 rounds of antibiotics for this issue. He denies pain. He denies current acute signs of infection. 5/9; patient presents for 1 week follow-up. He has been using Hydrofera Blue under 3 layer compression. He has no issues or complaints today. He denies signs of infection. 5/16; patient presents for 1 week follow-up. He has been using Hydrofera Blue under 3 layer compression. He has no issues or complaints today. He denies signs of infection. He states he overall feels well 5/23; patient presents for 1 week  follow-up. He has been using Hydrofera Blue under 3 layer compression. He has no issues or complaints today. He denies signs of infection. 6/6; patient presents for 2-week follow-up. He has been using Hydrofera Blue under 3 layer compression. He has had no issues with this. He denies any signs of infection and has no complaints today. Readmissionoodifferent wound 6/27 patient developed another wound to his right lower extremity. He states that 1 week ago he had a large admission for a different wound Paining  that was hung over his couch fell on his leg. He has been using alcohol and keeping the area covered. He denies signs of infection. 7/5; patient presents for 1 week follow-up. He has been tolerating Hydrofera Blue under 3 layer compression. He does report some pain to the wound site. He denies signs of infection. 7/11; patient presents for 1 week follow-up. He has been doing well with Hydrofera Blue under 3 layer compression. He reports improvement in pain since last clinic visit. He has no complaints today. He denies signs of infection. 7/18; patient presents for 1 week follow-up. Has been doing well with Hydrofera Blue under 3 layer compression. He denies signs of infection. Admission 05/24/2022 Mr. Domnic Vantol is a 66 year old male with a past medical history of venous insufficiency that presents to the clinic for a 1 month history of nonhealing wound to his right lower extremity. He states that a wagon wheel hit it and caused a wound. He has been treated with Keflex for this issue By his podiatrist. He has been using silver alginate to the wound bed with dressing changes. He currently denies signs of infection. He is not wearing compression stockings. 8/15; patient presents for follow-up. We have been using antibiotic ointment with Hydrofera Blue under 3 layer compression. He tolerated the compression wrap well. He has no issues or complaints today. He denies signs of infection. 8/22;  patient presents for follow-up. We have been using Santyl and Hydrofera Blue under 3 layer compression. He has no issues or complaints today. 8/29; patient presents for follow-up. We have been using collagen to the wound bed under 3 layer compression. He has no issues or complaints today. 9/7; patient presents for follow-up. We have been using collagen under 3 layer compression. Patient has no issues or complaints today. 9/12; patient presents for follow-up. We have been using collagen under 3 layer compression. Patient has no issues or complaints today. 9/21; patient presents for follow-up. We have been using endoform under 3 layer compression. Patient has no issues or complaints today. He tolerated the wrap well. 10/2; patient presents for follow-up. We have been using Xeroform under 3 layer compression. The wound is almost healed. Patient denies signs of infection. Patient History Information obtained from Patient, Chart. Family History Heart Disease - Father, Hypertension - Mother,Father,Siblings, Lung Disease - Mother, Thyroid Problems - Mother, No family history of Cancer, Diabetes, Hereditary Spherocytosis, Kidney Disease, Seizures, Stroke, Tuberculosis. Social History Former smoker - quit 2009, Marital Status - Divorced, Alcohol Use - Never, Drug Use - No History, Caffeine Use - Daily - coffee, diet soda. Medical History Eyes Patient has history of Cataracts, Glaucoma Denies history of Optic Neuritis Hematologic/Lymphatic Patient has history of Anemia Respiratory Patient has history of Asthma, Chronic Obstructive Pulmonary Disease (COPD), Sleep Apnea - sleeps in recliner Denies history of Pneumothorax Cardiovascular Patient has history of Arrhythmia - afib, Congestive Heart Failure, Deep Vein Thrombosis, Myocardial Infarction, Peripheral Venous Disease Endocrine Patient has history of Type II Diabetes Denies history of Type I Diabetes Genitourinary Denies history of End Stage  Renal Disease Integumentary (Skin) Denies history of History of Burn Musculoskeletal Patient has history of Osteoarthritis Oncologic Denies history of Received Chemotherapy, Received Radiation Psychiatric Denies history of Anorexia/bulimia, Confinement Anxiety Medical A Surgical History Notes nd Constitutional Symptoms (General Health) obesity Cardiovascular cardiomyopathy, pacemaker/defibulator Gastrointestinal GERD Endocrine thyrotoxicosis Genitourinary hx kidney stones, BPH Musculoskeletal thoracic and lumbosacral spondylosis Objective Constitutional respirations regular, non-labored and within target range for patient.. Vitals Time Taken: 3:35 PM,  Height: 70 in, Weight: 253 lbs, BMI: 36.3, Temperature: 98.2 F, Pulse: 73 bpm, Respiratory Rate: 20 breaths/min, Blood Pressure: 108/68 mmHg. Cardiovascular 2+ dorsalis pedis/posterior tibialis pulses. Psychiatric pleasant and cooperative. General Notes: Right lower extremity: T the proximal medial aspect there is an open wound with granulation tissue present. Venous stasis dermatitis. Good o edema control. Integumentary (Hair, Skin) Wound #3 status is Open. Original cause of wound was Trauma. The date acquired was: 04/15/2022. The wound has been in treatment 7 weeks. The wound is located on the Right,Medial Lower Leg. The wound measures 0.4cm length x 0.3cm width x 0.1cm depth; 0.094cm^2 area and 0.009cm^3 volume. There is Fat Layer (Subcutaneous Tissue) exposed. There is no tunneling or undermining noted. There is a medium amount of serosanguineous drainage noted. The wound margin is distinct with the outline attached to the wound base. There is small (1-33%) red granulation within the wound bed. There is a large (67-100%) amount of necrotic tissue within the wound bed including Adherent Slough. The periwound skin appearance had no abnormalities noted for texture. The periwound skin appearance had no abnormalities noted for  moisture. The periwound skin appearance had no abnormalities noted for color. Periwound temperature was noted as No Abnormality. Assessment Active Problems ICD-10 Chronic venous hypertension (idiopathic) with ulcer of right lower extremity Non-pressure chronic ulcer of other part of right lower leg with fat layer exposed Chronic systolic (congestive) heart failure Essential (primary) hypertension Other cardiomyopathies Chronic obstructive pulmonary disease, unspecified Other persistent atrial fibrillation Type 2 diabetes mellitus with other skin ulcer Other early complications of trauma, initial encounter Patient's wound appears well-healing. I recommended continuing compression therapy. I will switch the dressing to [facility] to see if this will help expedite the wound healing. Follow-up in 1 week. Procedures Wound #3 Pre-procedure diagnosis of Wound #3 is a Venous Leg Ulcer located on the Right,Medial Lower Leg . There was a Three Layer Compression Therapy Procedure by [name], RN. Post procedure Diagnosis Wound #3: Same as Pre-Procedure Plan Follow-up Appointments: Return Appointment in 1 week. - with Dr. Heber [place] and Leveda Anna, RN (Room 7) Anesthetic: (In clinic) Topical Lidocaine 5% applied to wound bed (In clinic) Topical Lidocaine 4% applied to wound bed Cellular or Tissue Based Products: Other Cellular or Tissue Based Products Orders/Instructions: - IVR for [employer] Hygiene: May shower with protection but do not get wound dressing(s) wet. - May shower using cast protector bag. Edema Control - Lymphedema / SCD / Other: Elevate legs to the level of the heart or above for 30 minutes daily and/or when sitting, a frequency of: - several times a day Avoid standing for long periods of time. Additional Orders / Instructions: Follow Nutritious Diet WOUND #3: - Lower Leg Wound Laterality: Right, Medial Cleanser: Soap and Water 1 x Per Week/30 Days Discharge  Instructions: May shower and wash wound with dial antibacterial soap and water prior to dressing change. Cleanser: Wound Cleanser 1 x Per Week/30 Days Discharge Instructions: Cleanse the wound with wound cleanser prior to applying a clean dressing using gauze sponges, not tissue or cotton balls. Peri-Wound Care: Triamcinolone 15 (g) 1 x Per Week/30 Days Discharge Instructions: Use triamcinolone 15 (g) as directed Peri-Wound Care: Sween Lotion (Moisturizing lotion) 1 x Per Week/30 Days Discharge Instructions: Apply moisturizing lotion as directed Prim Dressing: Hydrofera Blue Ready Foam, 2.5 x2.5 in 1 x Per Week/30 Days ary Discharge Instructions: Apply to wound bed as instructed Secondary Dressing: ABD Pad, 5x9 1 x Per Week/30 Days Discharge Instructions:  Apply over primary dressing as directed. Com pression Wrap: ThreePress (3 layer compression wrap) 1 x Per Week/30 Days Discharge Instructions: Apply three layer compression as directed. 1. Hydrofera Blue under 3 layer compression 2. Follow-up in 1 week Electronic Signature(s) Signed: 07/16/2022 4:32:49 PM By: [name] Entered By: Kalman Shan on 07/16/2022 16:29:03 -------------------------------------------------------------------------------- HxROS Details Patient Name: Date of Service: [name] RD L. 07/16/2022 3:30 PM Medical Record Number: [id number] Patient Account Number: [id number] Date of Birth/Sex: Treating RN: 10-07-1956 (66 y.o. M) Primary Care Provider: [name] Other Clinician: Referring Provider: Treating Provider/Extender: [name] in Treatment: 7 Information Obtained From Patient Chart Constitutional Symptoms (General Health) Medical History: Past Medical History Notes: obesity Eyes Medical History: Positive for: Cataracts; Glaucoma Negative for: Optic Neuritis Hematologic/Lymphatic Medical History: Positive for: Anemia Respiratory Medical History: Positive  for: Asthma; Chronic Obstructive Pulmonary Disease (COPD); Sleep Apnea - sleeps in recliner Negative for: Pneumothorax Cardiovascular Medical History: Positive for: Arrhythmia - afib; Congestive Heart Failure; Deep Vein Thrombosis; Myocardial Infarction; Peripheral Venous Disease Past Medical History Notes: cardiomyopathy, pacemaker/defibulator Gastrointestinal Medical History: Past Medical History Notes: GERD Endocrine Medical History: Positive for: Type II Diabetes Negative for: Type I Diabetes Past Medical History Notes: thyrotoxicosis Time with diabetes: 10 yrs Treated with: Diet Blood sugar tested every day: No Genitourinary Medical History: Negative for: End Stage Renal Disease Past Medical History Notes: hx kidney stones, BPH Integumentary (Skin) Medical History: Negative for: History of Burn Musculoskeletal Medical History: Positive for: Osteoarthritis Past Medical History Notes: thoracic and lumbosacral spondylosis Oncologic Medical History: Negative for: Received Chemotherapy; Received Radiation Psychiatric Medical History: Negative for: Anorexia/bulimia; Confinement Anxiety HBO Extended History Items Eyes: Eyes: Cataracts Glaucoma Immunizations Pneumococcal Vaccine: Received Pneumococcal Vaccination: Yes Received Pneumococcal Vaccination On or After 60th Birthday: Yes Implantable Devices Yes Family and Social History Cancer: No; Diabetes: No; Heart Disease: Yes - Father; Hereditary Spherocytosis: No; Hypertension: Yes - Mother,Father,Siblings; Kidney Disease: No; Lung Disease: Yes - Mother; Seizures: No; Stroke: No; Thyroid Problems: Yes - Mother; Tuberculosis: No; Former smoker - quit 2009; Marital Status - Divorced; Alcohol Use: Never; Drug Use: No History; Caffeine Use: Daily - coffee, diet soda; Financial Concerns: No; Food, Clothing or Shelter Needs: No; Support System Lacking: No; Transportation Concerns: No Electronic Signature(s) Signed:  07/16/2022 4:32:49 PM By: [name] Entered By: Kalman Shan on 07/16/2022 16:26:35 -------------------------------------------------------------------------------- SuperBill Details Patient Name: Date of Service: [name] RD L. 07/16/2022 Medical Record Number: [id number] Patient Account Number: [id number] Date of Birth/Sex: Treating RN: Mar 14, 1956 (66 y.o. [name] Primary Care Provider: [name] Other Clinician: Referring Provider: Treating Provider/Extender: [name] in Treatment: 7 Diagnosis Coding ICD-10 Codes Code Description [phone] Chronic venous hypertension (idiopathic) with ulcer of right lower extremity L97.812 Non-pressure chronic ulcer of other part of right lower leg with fat layer exposed M35.59 Chronic systolic (congestive) heart failure I10 Essential (primary) hypertension I42.8 Other cardiomyopathies J44.9 Chronic obstructive pulmonary disease, unspecified I48.19 Other persistent atrial fibrillation E11.622 Type 2 diabetes mellitus with other skin ulcer T79.8XXA Other early complications of trauma, initial encounter Facility Procedures CPT4 Code: 74163845 Description: (Facility Use Only) [phone] - [place] LWR RT LEG ICD-10 Diagnosis Description L97.812 Non-pressure chronic ulcer of other part of right lower leg with fat layer exposed Modifier: Quantity: 1 Physician Procedures : CPT4 Code Description Modifier [id number] - WC PHYS LEVEL 3 - EST PT ICD-10 Diagnosis Description I87.311 Chronic venous hypertension (idiopathic) with ulcer of right lower  extremity L97.812 Non-pressure chronic ulcer of other part of right lower  leg with fat layer exposed E11.622 Type 2 diabetes mellitus with other skin ulcer T79.8XXA Other early complications of trauma, initial encounter Quantity: 1 Electronic Signature(s) Signed: 07/16/2022 4:32:49 PM By: [name] Previous Signature: 07/16/2022 4:30:21 PM  Version By: [name] Entered By: Kalman Shan on 07/16/2022 16:30:53

## 2022-07-23 ENCOUNTER — Encounter (HOSPITAL_BASED_OUTPATIENT_CLINIC_OR_DEPARTMENT_OTHER): Payer: Medicare HMO | Admitting: Internal Medicine

## 2022-07-23 DIAGNOSIS — I4819 Other persistent atrial fibrillation: Secondary | ICD-10-CM | POA: Diagnosis not present

## 2022-07-23 DIAGNOSIS — E11622 Type 2 diabetes mellitus with other skin ulcer: Secondary | ICD-10-CM | POA: Diagnosis not present

## 2022-07-23 DIAGNOSIS — I87311 Chronic venous hypertension (idiopathic) with ulcer of right lower extremity: Secondary | ICD-10-CM | POA: Diagnosis not present

## 2022-07-23 DIAGNOSIS — J449 Chronic obstructive pulmonary disease, unspecified: Secondary | ICD-10-CM | POA: Diagnosis not present

## 2022-07-23 DIAGNOSIS — T798XXA Other early complications of trauma, initial encounter: Secondary | ICD-10-CM

## 2022-07-23 DIAGNOSIS — I5022 Chronic systolic (congestive) heart failure: Secondary | ICD-10-CM | POA: Diagnosis not present

## 2022-07-23 DIAGNOSIS — I428 Other cardiomyopathies: Secondary | ICD-10-CM | POA: Diagnosis not present

## 2022-07-23 DIAGNOSIS — L97812 Non-pressure chronic ulcer of other part of right lower leg with fat layer exposed: Secondary | ICD-10-CM

## 2022-07-23 DIAGNOSIS — I110 Hypertensive heart disease with heart failure: Secondary | ICD-10-CM | POA: Diagnosis not present

## 2022-07-23 NOTE — Progress Notes (Signed)
[name], Zachary ([id number]) [id number] Page 1 of 8 Visit Report for 07/23/2022 Chief Complaint Document Details Patient Name: Date of Service: [name] RD L. 07/23/2022 2:45 PM Medical Record Number: [id number] Patient Account Number: [id number] Date of Birth/Sex: Treating RN: [birth year] (66 y.o. [name] Primary Care Provider: [name] Other Clinician: Referring Provider: Treating Provider/Extender: [name] in Treatment: 8 Information Obtained from: Patient Chief Complaint 05/24/2022; right lower extremity wound Electronic Signature(s) Signed: 07/23/2022 4:00:22 PM By: [name] Entered By: Kalman Shan on 07/23/2022 15:18:29 -------------------------------------------------------------------------------- HPI Details Patient Name: Date of Service: [name] RD L. 07/23/2022 2:45 PM Medical Record Number: [id number] Patient Account Number: [id number] Date of Birth/Sex: Treating RN: 02/13/1956 (66 y.o. [name] Primary Care Provider: [name] Other Clinician: Referring Provider: Treating Provider/Extender: [name] in Treatment: 8 History of Present Illness HPI Description: Admission 5/2 [name] is a 66 year old male with a past medical history of systolic congestive heart failure, COPD, nonischemic cardiomyopathy, type 2 diabetes, chronic venous insufficiency that presents to the clinic for a 43-monthhistory of nonhealing wound to his left lower extremity. He states that in November 2021 he fell off his porch and Hit his leg against a brick. The wound has healed mostly except for 1 area that is still open. He reports moderate Serosanguineous drainage daily. He has been keeping it covered with Band-Aids. He reports 2 rounds of antibiotics for this issue. He denies pain. He denies current acute signs of infection. 5/9; patient presents for 1 week follow-up. He has  been using Hydrofera Blue under 3 layer compression. He has no issues or complaints today. He denies signs of infection. 5/16; patient presents for 1 week follow-up. He has been using Hydrofera Blue under 3 layer compression. He has no issues or complaints today. He denies signs of infection. He states he overall feels well 5/23; patient presents for 1 week follow-up. He has been using Hydrofera Blue under 3 layer compression. He has no issues or complaints today. He denies signs of infection. 6/6; patient presents for 2-week follow-up. He has been using Hydrofera Blue under 3 layer compression. He has had no issues with this. He denies any signs of infection and has no complaints today. Readmissiondifferent wound 6/27 patient developed another wound to his right lower extremity. He states that 1 week ago he had a large admission for a different wound Paining that was hung over his couch fell on his leg. He has been using alcohol and keeping the area covered. He denies signs of infection. 7/5; patient presents for 1 week follow-up. He has been tolerating Hydrofera Blue under 3 layer compression. He does report some pain to the wound site. He denies signs of infection. 7/11; patient presents for 1 week follow-up. He has been doing well with Hydrofera Blue under 3 layer compression. He reports improvement in pain since last clinic visit. He has no complaints today. He denies signs of infection. 7/18; patient presents for 1 week follow-up. Has been doing well with Hydrofera Blue under 3 layer compression. He denies signs of infection. YTAYLOR, Zachary([id number] Page 2 of 8 Admission 05/24/2022 Mr. RCriss Palloneis a 66year old male with a past medical history of venous insufficiency that presents to the clinic for a 1 month history of nonhealing wound to his right lower extremity. He states that a wagon wheel hit it and caused a wound. He has been treated with  Keflex  for this issue By his podiatrist. He has been using silver alginate to the wound bed with dressing changes. He currently denies signs of infection. He is not wearing compression stockings. 8/15; patient presents for follow-up. We have been using antibiotic ointment with Hydrofera Blue under 3 layer compression. He tolerated the compression wrap well. He has no issues or complaints today. He denies signs of infection. 8/22; patient presents for follow-up. We have been using Santyl and Hydrofera Blue under 3 layer compression. He has no issues or complaints today. 8/29; patient presents for follow-up. We have been using collagen to the wound bed under 3 layer compression. He has no issues or complaints today. 9/7; patient presents for follow-up. We have been using collagen under 3 layer compression. Patient has no issues or complaints today. 9/12; patient presents for follow-up. We have been using collagen under 3 layer compression. Patient has no issues or complaints today. 9/21; patient presents for follow-up. We have been using endoform under 3 layer compression. Patient has no issues or complaints today. He tolerated the wrap well. 10/2; patient presents for follow-up. We have been using Xeroform under 3 layer compression. The wound is almost healed. Patient denies signs of infection. 10/9; patient presents for follow-up. We have been using Hydrofera Blue under 3 layer compression. His wound is healed. He has compression stockings at home. Electronic Signature(s) Signed: 07/23/2022 4:00:22 PM By: [name] Entered By: Kalman Shan on 07/23/2022 15:19:04 -------------------------------------------------------------------------------- Physical Exam Details Patient Name: Date of Service: [name] RD L. 07/23/2022 2:45 PM Medical Record Number: [id number] Patient Account Number: [id number] Date of Birth/Sex: Treating RN: 02/22/1956 (66 y.o. [name] Primary Care  Provider: [name] Other Clinician: Referring Provider: Treating Provider/Extender: [name] in Treatment: 8 Constitutional respirations regular, non-labored and within target range for patient.. Cardiovascular 2+ dorsalis pedis/posterior tibialis pulses. Psychiatric pleasant and cooperative. Notes Right lower extremity: T the proximal medial aspect there is a small scab to the previous wound site. Venous stasis dermatitis. Good edema control. o Electronic Signature(s) Signed: 07/23/2022 4:00:22 PM By: [name] Entered By: Kalman Shan on 07/23/2022 15:20:47 -------------------------------------------------------------------------------- Physician Orders Details Patient Name: Date of Service: [name] RD L. 07/23/2022 2:45 PM Medical Record Number: [id number] Patient Account Number: [id number] Date of Birth/Sex: Treating RN: 08/04/1956 (66 y.o. [name] Primary Care Provider: [name] Other Clinician: Referring Provider: Treating Provider/Extender: [name] in Treatment: [address] [place] ([id number]) [id number] Page 3 of 8 Verbal / Phone Orders: No Diagnosis Coding Discharge From [facility] Services Discharge from [place] - No further follow up needed, call if anything changes. Edema Control - Lymphedema / SCD / Other Avoid standing for long periods of time. Moisturize legs daily. Other Edema Control Orders/Instructions: - Either wear your compression stockings or Tubigrip. Apply first thing in morning and remove at bedtime. Electronic Signature(s) Signed: 07/23/2022 4:00:22 PM By: [name] Entered By: Kalman Shan on 07/23/2022 15:20:53 -------------------------------------------------------------------------------- Problem List Details Patient Name: Date of Service: [name] RD L. 07/23/2022 2:45 PM Medical Record Number: [id number] Patient  Account Number: [id number] Date of Birth/Sex: Treating RN: August 14, 1956 (66 y.o. [name] Primary Care Provider: [name] Other Clinician: Referring Provider: Treating Provider/Extender: [name] in Treatment: 8 Active Problems ICD-10 Encounter Code Description Active Date MDM Diagnosis I87.311 Chronic venous hypertension (idiopathic) with ulcer of right lower extremity 05/24/2022 No Yes L97.812 Non-pressure chronic ulcer of  other part of right lower leg with fat layer 05/24/2022 No Yes exposed W09.81 Chronic systolic (congestive) heart failure 05/24/2022 No Yes I10 Essential (primary) hypertension 05/24/2022 No Yes I42.8 Other cardiomyopathies 05/24/2022 No Yes J44.9 Chronic obstructive pulmonary disease, unspecified 05/24/2022 No Yes I48.19 Other persistent atrial fibrillation 05/24/2022 No Yes E11.622 Type 2 diabetes mellitus with other skin ulcer 05/24/2022 No Yes T79.8XXA Other early complications of trauma, initial encounter 05/24/2022 No Yes RAMONDO, DIETZE ([id number]) [id number] Page 4 of 8 Inactive Problems Resolved Problems Electronic Signature(s) Signed: 07/23/2022 4:00:22 PM By: [name] Entered By: Kalman Shan on 07/23/2022 15:18:15 -------------------------------------------------------------------------------- Progress Note Details Patient Name: Date of Service: [name] RD L. 07/23/2022 2:45 PM Medical Record Number: [id number] Patient Account Number: [id number] Date of Birth/Sex: Treating RN: 1956-08-30 (66 y.o. [name] Primary Care Provider: [name] Other Clinician: Referring Provider: Treating Provider/Extender: [name] in Treatment: 8 Subjective Chief Complaint Information obtained from Patient 05/24/2022; right lower extremity wound History of Present Illness (HPI) Admission 5/2 Mr. Detter is a 66 year old male with a past medical history of  systolic congestive heart failure, COPD, nonischemic cardiomyopathy, type 2 diabetes, chronic venous insufficiency that presents to the clinic for a 62-monthhistory of nonhealing wound to his left lower extremity. He states that in November 2021 he fell off his porch and Hit his leg against a brick. The wound has healed mostly except for 1 area that is still open. He reports moderate Serosanguineous drainage daily. He has been keeping it covered with Band-Aids. He reports 2 rounds of antibiotics for this issue. He denies pain. He denies current acute signs of infection. 5/9; patient presents for 1 week follow-up. He has been using Hydrofera Blue under 3 layer compression. He has no issues or complaints today. He denies signs of infection. 5/16; patient presents for 1 week follow-up. He has been using Hydrofera Blue under 3 layer compression. He has no issues or complaints today. He denies signs of infection. He states he overall feels well 5/23; patient presents for 1 week follow-up. He has been using Hydrofera Blue under 3 layer compression. He has no issues or complaints today. He denies signs of infection. 6/6; patient presents for 2-week follow-up. He has been using Hydrofera Blue under 3 layer compression. He has had no issues with this. He denies any signs of infection and has no complaints today. Readmissionoodifferent wound 6/27 patient developed another wound to his right lower extremity. He states that 1 week ago he had a large admission for a different wound Paining that was hung over his couch fell on his leg. He has been using alcohol and keeping the area covered. He denies signs of infection. 7/5; patient presents for 1 week follow-up. He has been tolerating Hydrofera Blue under 3 layer compression. He does report some pain to the wound site. He denies signs of infection. 7/11; patient presents for 1 week follow-up. He has been doing well with Hydrofera Blue under 3 layer  compression. He reports improvement in pain since last clinic visit. He has no complaints today. He denies signs of infection. 7/18; patient presents for 1 week follow-up. Has been doing well with Hydrofera Blue under 3 layer compression. He denies signs of infection. Admission 05/24/2022 Mr. RMayank Teuscheris a 66year old male with a past medical history of venous insufficiency that presents to the clinic for a 1 month history of nonhealing wound to his right lower extremity. He states that a wagon wheel  hit it and caused a wound. He has been treated with Keflex for this issue By his podiatrist. He has been using silver alginate to the wound bed with dressing changes. He currently denies signs of infection. He is not wearing compression stockings. 8/15; patient presents for follow-up. We have been using antibiotic ointment with Hydrofera Blue under 3 layer compression. He tolerated the compression wrap well. He has no issues or complaints today. He denies signs of infection. 8/22; patient presents for follow-up. We have been using Santyl and Hydrofera Blue under 3 layer compression. He has no issues or complaints today. 8/29; patient presents for follow-up. We have been using collagen to the wound bed under 3 layer compression. He has no issues or complaints today. 9/7; patient presents for follow-up. We have been using collagen under 3 layer compression. Patient has no issues or complaints today. 9/12; patient presents for follow-up. We have been using collagen under 3 layer compression. Patient has no issues or complaints today. 9/21; patient presents for follow-up. We have been using endoform under 3 layer compression. Patient has no issues or complaints today. He tolerated the [place] ([id number]) [id number] Page 5 of 8 wrap well. 10/2; patient presents for follow-up. We have been using Xeroform under 3 layer compression. The wound is almost healed. Patient  denies signs of infection. 10/9; patient presents for follow-up. We have been using Hydrofera Blue under 3 layer compression. His wound is healed. He has compression stockings at home. Patient History Information obtained from Patient, Chart. Family History Heart Disease - Father, Hypertension - Mother,Father,Siblings, Lung Disease - Mother, Thyroid Problems - Mother, No family history of Cancer, Diabetes, Hereditary Spherocytosis, Kidney Disease, Seizures, Stroke, Tuberculosis. Social History Former smoker - quit 2009, Marital Status - Divorced, Alcohol Use - Never, Drug Use - No History, Caffeine Use - Daily - coffee, diet soda. Medical History Eyes Patient has history of Cataracts, Glaucoma Denies history of Optic Neuritis Hematologic/Lymphatic Patient has history of Anemia Respiratory Patient has history of Asthma, Chronic Obstructive Pulmonary Disease (COPD), Sleep Apnea - sleeps in recliner Denies history of Pneumothorax Cardiovascular Patient has history of Arrhythmia - afib, Congestive Heart Failure, Deep Vein Thrombosis, Myocardial Infarction, Peripheral Venous Disease Endocrine Patient has history of Type II Diabetes Denies history of Type I Diabetes Genitourinary Denies history of End Stage Renal Disease Integumentary (Skin) Denies history of History of Burn Musculoskeletal Patient has history of Osteoarthritis Oncologic Denies history of Received Chemotherapy, Received Radiation Psychiatric Denies history of Anorexia/bulimia, Confinement Anxiety Medical A Surgical History Notes nd Constitutional Symptoms (General Health) obesity Cardiovascular cardiomyopathy, pacemaker/defibulator Gastrointestinal GERD Endocrine thyrotoxicosis Genitourinary hx kidney stones, BPH Musculoskeletal thoracic and lumbosacral spondylosis Objective Constitutional respirations regular, non-labored and within target range for patient.. Vitals Time Taken: 2:42 PM, Height: 70 in,  Weight: 253 lbs, BMI: 36.3, Temperature: 97.9 F, Pulse: 73 bpm, Respiratory Rate: 18 breaths/min, Blood Pressure: 111/72 mmHg. Cardiovascular 2+ dorsalis pedis/posterior tibialis pulses. Psychiatric pleasant and cooperative. General Notes: Right lower extremity: T the proximal medial aspect there is a small scab to the previous wound site. Venous stasis dermatitis. Good edema o control. Integumentary (Hair, Skin) Wound #3 status is Healed - Epithelialized. Original cause of wound was Trauma. The date acquired was: 04/15/2022. The wound has been in treatment 8 weeks. The wound is located on the Right,Medial Lower Leg. The wound measures 0cm length x 0cm width x 0cm depth; 0cm^2 area and 0cm^3 volume. AARIAN, GRIFFIE ([id number]) [id number] Page 6 of 8 Assessment Active  Problems ICD-10 Chronic venous hypertension (idiopathic) with ulcer of right lower extremity Non-pressure chronic ulcer of other part of right lower leg with fat layer exposed Chronic systolic (congestive) heart failure Essential (primary) hypertension Other cardiomyopathies Chronic obstructive pulmonary disease, unspecified Other persistent atrial fibrillation Type 2 diabetes mellitus with other skin ulcer Other early complications of trauma, initial encounter Patient has a small scab over the previous wound site. This appears well-healed. I recommended daily compression stockings. We will give him Tubigrip in office. Follow-up as needed. Plan Discharge From [facility] Services: Discharge from [place] - No further follow up needed, call if anything changes. Edema Control - Lymphedema / SCD / Other: Avoid standing for long periods of time. Moisturize legs daily. Other Edema Control Orders/Instructions: - Either wear your compression stockings or Tubigrip. Apply first thing in morning and remove at bedtime. 1. Daily compression stockings 2. Discharge from clinic due to closed wound 3.  Follow-up as needed Electronic Signature(s) Signed: 07/23/2022 4:00:22 PM By: [name] Entered By: Kalman Shan on 07/23/2022 15:21:36 -------------------------------------------------------------------------------- HxROS Details Patient Name: Date of Service: [name] RD L. 07/23/2022 2:45 PM Medical Record Number: [id number] Patient Account Number: [id number] Date of Birth/Sex: Treating RN: 08/29/1956 (66 y.o. [name] Primary Care Provider: [name] Other Clinician: Referring Provider: Treating Provider/Extender: [name] in Treatment: 8 Information Obtained From Patient Chart Constitutional Symptoms (General Health) Medical History: Past Medical History Notes: obesity Eyes Medical History: Positive for: Cataracts; Glaucoma Negative for: Optic Neuritis Hematologic/Lymphatic JASIYAH, PAULDING ([id number]) [id number] Page 7 of 8 Medical History: Positive for: Anemia Respiratory Medical History: Positive for: Asthma; Chronic Obstructive Pulmonary Disease (COPD); Sleep Apnea - sleeps in recliner Negative for: Pneumothorax Cardiovascular Medical History: Positive for: Arrhythmia - afib; Congestive Heart Failure; Deep Vein Thrombosis; Myocardial Infarction; Peripheral Venous Disease Past Medical History Notes: cardiomyopathy, pacemaker/defibulator Gastrointestinal Medical History: Past Medical History Notes: GERD Endocrine Medical History: Positive for: Type II Diabetes Negative for: Type I Diabetes Past Medical History Notes: thyrotoxicosis Time with diabetes: 10 yrs Treated with: Diet Blood sugar tested every day: No Genitourinary Medical History: Negative for: End Stage Renal Disease Past Medical History Notes: hx kidney stones, BPH Integumentary (Skin) Medical History: Negative for: History of Burn Musculoskeletal Medical History: Positive for: Osteoarthritis Past Medical  History Notes: thoracic and lumbosacral spondylosis Oncologic Medical History: Negative for: Received Chemotherapy; Received Radiation Psychiatric Medical History: Negative for: Anorexia/bulimia; Confinement Anxiety HBO Extended History Items Eyes: Eyes: Cataracts Glaucoma Immunizations Pneumococcal Vaccine: Received Pneumococcal Vaccination: Yes Received Pneumococcal Vaccination On or After 60th Birthday: Yes Implantable Devices Yes Family and Social History Cancer: No; Diabetes: No; Heart Disease: Yes - Father; Hereditary Spherocytosis: No; Hypertension: Yes - Mother,Father,Siblings; Kidney Disease: No; Lung Disease: Yes - Mother; Seizures: No; Stroke: No; Thyroid Problems: Yes - Mother; Tuberculosis: No; Former smoker - quit 2009; Marital Status - DEREON, CORKERY ([id number]) [id number] Page 8 of 8 Divorced; Alcohol Use: Never; Drug Use: No History; Caffeine Use: Daily - coffee, diet soda; Financial Concerns: No; Food, Clothing or Shelter Needs: No; Support System Lacking: No; Transportation Concerns: No Electronic Signature(s) Signed: 07/23/2022 4:00:22 PM By: [name] Signed: 07/23/2022 4:35:51 PM By: [name] Entered By: Kalman Shan on 07/23/2022 15:19:09 -------------------------------------------------------------------------------- SuperBill Details Patient Name: Date of Service: [name] RD L. 07/23/2022 Medical Record Number: [id number] Patient Account Number: [id number] Date of Birth/Sex: Treating RN: 1956/03/25 (66 y.o. [name] Primary Care Provider: [name] Other Clinician: Referring Provider:  Treating Provider/Extender: [name] in Treatment: 8 Diagnosis Coding ICD-10 Codes Code Description I87.311 Chronic venous hypertension (idiopathic) with ulcer of right lower extremity L97.812 Non-pressure chronic ulcer of other part of right lower leg with fat layer exposed Q11.94  Chronic systolic (congestive) heart failure I10 Essential (primary) hypertension I42.8 Other cardiomyopathies J44.9 Chronic obstructive pulmonary disease, unspecified I48.19 Other persistent atrial fibrillation E11.622 Type 2 diabetes mellitus with other skin ulcer T79.8XXA Other early complications of trauma, initial encounter Facility Procedures : 7 CPT4 Code: [id number] Description: [phone] - WOUND CARE VISIT-LEV 2 EST PT Modifier: Quantity: 1 Physician Procedures : CPT4 Code Description Modifier [id number] - WC PHYS LEVEL 3 - EST PT ICD-10 Diagnosis Description I87.311 Chronic venous hypertension (idiopathic) with ulcer of right lower extremity L97.812 Non-pressure chronic ulcer of other part of right lower  leg with fat layer exposed E11.622 Type 2 diabetes mellitus with other skin ulcer T79.8XXA Other early complications of trauma, initial encounter Quantity: 1 Electronic Signature(s) Signed: 07/23/2022 4:00:22 PM By: [name] Entered By: Kalman Shan on 07/23/2022 15:21:54

## 2022-07-24 NOTE — Progress Notes (Signed)
SHADRACH, BARTUNEK ([id number]) [id number] Page 1 of 7 Visit Report for 07/23/2022 Arrival Information Details Patient Name: Date of Service: [name] RD L. 07/23/2022 2:45 PM Medical Record Number: [id number] Patient Account Number: [id number] Date of Birth/Sex: Treating RN: 11/28/1955 (66 y.o. [name] Primary Care Dafina Suk: [name] Other Clinician: Referring Katalea Ucci: Treating Jaydn Moscato/Extender: [name] in Treatment: 8 Visit Information History Since Last Visit Added or deleted any medications: No Patient Arrived: [name] Any new allergies or adverse reactions: No Arrival Time: 14:41 Had a fall or experienced change in No Accompanied By: self activities of daily living that may affect Transfer Assistance: None risk of falls: Patient Identification Verified: Yes Signs or symptoms of abuse/neglect since last visito No Secondary Verification Process Completed: Yes Hospitalized since last visit: No Patient Requires Transmission-Based Precautions: No Implantable device outside of the clinic excluding No Patient Has Alerts: Yes cellular tissue based products placed in the center Patient Alerts: Patient on Blood Thinner since last visit: Pacemaker/Defibrillator Has Compression in Place as Prescribed: Yes Pain Present Now: No Electronic Signature(s) Signed: 07/24/2022 8:52:44 AM By: Erenest Blank Entered By: Erenest Blank on 07/23/2022 14:42:47 -------------------------------------------------------------------------------- Clinic Level of Care Assessment Details Patient Name: Date of Service: [name] RD L. 07/23/2022 2:45 PM Medical Record Number: [id number] Patient Account Number: [id number] Date of Birth/Sex: Treating RN: 1955-11-27 (66 y.o. [name] Primary Care Drevin Ortner: [name] Other Clinician: Referring Teasia Zapf: Treating Katricia Prehn/Extender: [name] in Treatment:  8 Clinic Level of Care Assessment Items TOOL 4 Quantity Score X- 1 0 Use when only an EandM is performed on FOLLOW-UP visit ASSESSMENTS - Nursing Assessment / Reassessment '[]'$  - 0 Reassessment of Co-morbidities (includes updates in patient status) '[]'$  - 0 Reassessment of Adherence to Treatment Plan ASSESSMENTS - Wound and Skin A ssessment / Reassessment X - Simple Wound Assessment / Reassessment - one wound 1 5 '[]'$  - 0 Complex Wound Assessment / Reassessment - multiple wounds '[]'$  - 0 Dermatologic / Skin Assessment (not related to wound area) ASSESSMENTS - Focused Assessment '[]'$  - 0 Circumferential Edema Measurements - multi extremities '[]'$  - 0 Nutritional Assessment / Counseling / [place], Chesney L ([id number]) [id number] Page 2 of 7 '[]'$  - 0 Lower Extremity Assessment (monofilament, tuning fork, pulses) '[]'$  - 0 Peripheral Arterial Disease Assessment (using hand held doppler) ASSESSMENTS - Ostomy and/or Continence Assessment and Care '[]'$  - 0 Incontinence Assessment and Management '[]'$  - 0 Ostomy Care Assessment and Management (repouching, etc.) PROCESS - Coordination of Care X - Simple Patient / Family Education for ongoing care 1 15 '[]'$  - 0 Complex (extensive) Patient / Family Education for ongoing care '[]'$  - 0 Staff obtains Programmer, systems, Records, T Results / Process Orders est '[]'$  - 0 Staff telephones HHA, Nursing Homes / Clarify orders / etc '[]'$  - 0 Routine Transfer to another Facility (non-emergent condition) '[]'$  - 0 Routine Hospital Admission (non-emergent condition) '[]'$  - 0 New Admissions / Biomedical engineer / Ordering NPWT Apligraf, etc. , '[]'$  - 0 Emergency Hospital Admission (emergent condition) X- 1 10 Simple Discharge Coordination '[]'$  - 0 Complex (extensive) Discharge Coordination PROCESS - Special Needs '[]'$  - 0 Pediatric / Minor Patient Management '[]'$  - 0 Isolation Patient Management '[]'$  - 0 Hearing / Language / Visual special  needs '[]'$  - 0 Assessment of Community assistance (transportation, D/C planning, etc.) '[]'$  - 0 Additional assistance / Altered mentation '[]'$  - 0 Support Surface(s) Assessment (bed, cushion, seat, etc.) INTERVENTIONS - Wound Cleansing /  Measurement '[]'$  - 0 Simple Wound Cleansing - one wound '[]'$  - 0 Complex Wound Cleansing - multiple wounds X- 1 5 Wound Imaging (photographs - any number of wounds) '[]'$  - 0 Wound Tracing (instead of photographs) '[]'$  - 0 Simple Wound Measurement - one wound '[]'$  - 0 Complex Wound Measurement - multiple wounds INTERVENTIONS - Wound Dressings '[]'$  - 0 Small Wound Dressing one or multiple wounds '[]'$  - 0 Medium Wound Dressing one or multiple wounds '[]'$  - 0 Large Wound Dressing one or multiple wounds '[]'$  - 0 Application of Medications - topical '[]'$  - 0 Application of Medications - injection INTERVENTIONS - Miscellaneous '[]'$  - 0 External ear exam '[]'$  - 0 Specimen Collection (cultures, biopsies, blood, body fluids, etc.) '[]'$  - 0 Specimen(s) / Culture(s) sent or taken to Lab for analysis '[]'$  - 0 Patient Transfer (multiple staff / Civil Service fast streamer / Similar devices) '[]'$  - 0 Simple Staple / Suture removal (25 or less) '[]'$  - 0 Complex Staple / Suture removal (26 or more) '[]'$  - 0 Hypo / Hyperglycemic Management (close monitor of Blood Glucose) WILLARD, FARQUHARSON ([id number]) [phone].pdf Page 3 of 7 '[]'$  - 0 Ankle / Brachial Index (ABI) - do not check if billed separately X- 1 5 Vital Signs Has the patient been seen at the hospital within the last three years: Yes Total Score: 40 Level Of Care: New/Established - Level 2 Electronic Signature(s) Signed: 07/23/2022 4:35:51 PM By: [name] Entered By: [name] on 07/23/2022 15:16:50 -------------------------------------------------------------------------------- Encounter Discharge Information Details Patient Name: Date of Service: [name] RD L. 07/23/2022 2:45 PM Medical Record Number:  [id number] Patient Account Number: [id number] Date of Birth/Sex: Treating RN: 28-Feb-1956 (66 y.o. [name] Primary Care Muaaz Brau: [name] Other Clinician: Referring Kristoff Coonradt: Treating Kamani Lewter/Extender: [name] in Treatment: 8 Encounter Discharge Information Items Discharge Condition: Stable Ambulatory Status: [place] Discharge Destination: Home Transportation: Private Auto Schedule Follow-up Appointment: No Clinical Summary of Care: Provided on 07/23/2022 Form Type Recipient Paper Patient Patient Electronic Signature(s) Signed: 07/23/2022 4:35:51 PM By: [name] Entered By: [name] on 07/23/2022 15:17:53 -------------------------------------------------------------------------------- Lower Extremity Assessment Details Patient Name: Date of Service: [name] RD L. 07/23/2022 2:45 PM Medical Record Number: [id number] Patient Account Number: [id number] Date of Birth/Sex: Treating RN: [birth year] (66 y.o. [name] Primary Care Fletcher Ostermiller: [name] Other Clinician: Referring Loxley Schmale: Treating Esma Kilts/Extender: [name] in Treatment: 8 Edema Assessment Assessed: [Left: No] [Right: No] Edema: [Left: N] [Right: o] Calf Left: Right: Point of Measurement: 36 cm From Medial Instep 33.5 cm Ankle Left: Right: Point of Measurement: 12 cm From Medial Instep 21 cm Vascular Assessment Left: [[id number] Page 4 of 7Right:] Pulses: Dorsalis Pedis Palpable: [[id number] Page 4 of 7Yes] Electronic Signature(s) Signed: 07/23/2022 4:35:51 PM By: [name] Signed: 07/24/2022 8:52:44 AM By: Erenest Blank Entered By: Erenest Blank on 07/23/2022 14:55:56 -------------------------------------------------------------------------------- Multi Wound Chart Details Patient Name: Date of Service: [name] RD L. 07/23/2022 2:45 PM Medical Record Number:  [id number] Patient Account Number: [id number] Date of Birth/Sex: Treating RN: 02-06-1956 (66 y.o. [name] Primary Care Cass Edinger: [name] Other Clinician: Referring Dearion Huot: Treating Zakariyah Freimark/Extender: [name] in Treatment: 8 Vital Signs Height(in): 70 Pulse(bpm): 50 Weight(lbs): 595 Blood Pressure(mmHg): 111/72 Body Mass Index(BMI): 36.3 Temperature(F): 97.9 Respiratory Rate(breaths/min): 18 [3:Photos:] [N/A:N/A] Right, Medial Lower Leg N/A N/A Wound Location: Trauma N/A N/A Wounding Event: Venous Leg Ulcer N/A N/A Primary Etiology: Cataracts, Glaucoma, Anemia, N/A N/A Comorbid  History: Asthma, Chronic Obstructive Pulmonary Disease (COPD), Sleep Apnea, Arrhythmia, Congestive Heart Failure, Deep Vein Thrombosis, Myocardial Infarction, Peripheral Venous Disease, Type II Diabetes, Osteoarthritis 04/15/2022 N/A N/A Date Acquired: 8 N/A N/A Weeks of Treatment: Healed - Epithelialized N/A N/A Wound Status: No N/A N/A Wound Recurrence: 0x0x0 N/A N/A Measurements L x W x D (cm) 0 N/A N/A A (cm) : rea 0 N/A N/A Volume (cm) : 100.00% N/A N/A % Reduction in Area: 100.00% N/A N/A % Reduction in Volume: Full Thickness Without Exposed N/A N/A Classification: Support Structures Treatment Notes Electronic Signature(s) Signed: 07/23/2022 4:00:22 PM By: [name] Signed: 07/23/2022 4:35:51 PM By: [name] Entered By: Kalman Shan on 07/23/2022 15:18:20 [name] ([id number]) [id number] Page 5 of 7 -------------------------------------------------------------------------------- Multi-Disciplinary Care Plan Details Patient Name: Date of Service: [name] RD L. 07/23/2022 2:45 PM Medical Record Number: [id number] Patient Account Number: [id number] Date of Birth/Sex: Treating RN: [birth year] (66 y.o. [name] Primary Care Tyniah Kastens: [name] Other Clinician: Referring  Arham Symmonds: Treating Walden Statz/Extender: [name] in Treatment: 8 Active Inactive Electronic Signature(s) Signed: 07/23/2022 4:35:51 PM By: [name] Entered By: [name] on 07/23/2022 15:15:03 -------------------------------------------------------------------------------- Pain Assessment Details Patient Name: Date of Service: [name] RD L. 07/23/2022 2:45 PM Medical Record Number: [id number] Patient Account Number: [id number] Date of Birth/Sex: Treating RN: 1956-03-05 (66 y.o. [name] Primary Care Zaraya Delauder: [name] Other Clinician: Referring Jonise Weightman: Treating Shaquavia Whisonant/Extender: [name] in Treatment: 8 Active Problems Location of Pain Severity and Description of Pain Patient Has Paino No Site Locations Pain Management and Medication Current Pain Management: Electronic Signature(s) Signed: 07/23/2022 4:35:51 PM By: [name] Signed: 07/24/2022 8:52:44 AM By: Erenest Blank Entered By: Erenest Blank on 07/23/2022 14:43:20 Jasper, Hanf Janine Ores ([id number]) [id number] Page 6 of 7 -------------------------------------------------------------------------------- Patient/Caregiver Education Details Patient Name: Date of Service: [name] RD L. 10/9/2023andnbsp2:45 PM Medical Record Number: [id number] Patient Account Number: [id number] Date of Birth/Gender: Treating RN: 06/04/1956 (66 y.o. [name] Primary Care Physician: [name] Other Clinician: Referring Physician: Treating Physician/Extender: [name] in Treatment: 8 Education Assessment Education Provided To: Patient Education Topics Provided Venous: Methods: Demonstration, Explain/Verbal, Printed Responses: State content correctly Electronic Signature(s) Signed: 07/23/2022 4:35:51 PM By: [name] Entered By: [name] on 07/23/2022  15:15:20 -------------------------------------------------------------------------------- Wound Assessment Details Patient Name: Date of Service: [name] RD L. 07/23/2022 2:45 PM Medical Record Number: [id number] Patient Account Number: [id number] Date of Birth/Sex: Treating RN: 1956/09/21 (66 y.o. [name] Primary Care Jozie Wulf: [name] Other Clinician: Referring Ad Guttman: Treating Laylanie Kruczek/Extender: [name] in Treatment: 8 Wound Status Wound Number: 3 Primary Venous Leg Ulcer Etiology: Wound Location: Right, Medial Lower Leg Wound Healed - Epithelialized Wounding Event: Trauma Status: Date Acquired: 04/15/2022 Comorbid Cataracts, Glaucoma, Anemia, Asthma, Chronic Obstructive Weeks Of Treatment: 8 History: Pulmonary Disease (COPD), Sleep Apnea, Arrhythmia, Congestive Clustered Wound: No Heart Failure, Deep Vein Thrombosis, Myocardial Infarction, Peripheral Venous Disease, Type II Diabetes, Osteoarthritis Photos Wound Measurements SARTAJ, HOSKIN ([id number]) Length: (cm) 0 Width: (cm) 0 Depth: (cm) 0 Area: (cm) 0 Volume: (cm) 0 [id number] Page 7 of 7 % Reduction in Area: 100% % Reduction in Volume: 100% Wound Description Classification: Full Thickness Without Exposed Support Structures Periwound Skin Texture Texture Color No Abnormalities Noted: No No Abnormalities Noted: No Moisture No Abnormalities Noted: No Electronic Signature(s) Signed: 07/23/2022 4:35:51 PM By: [name] Entered By: [name] on 07/23/2022  15:12:53 -------------------------------------------------------------------------------- Vitals Details Patient Name: Date of ServiceLissa [name] RD L. 07/23/2022 2:45 PM Medical Record Number: [id number] Patient Account Number: [id number] Date of Birth/Sex: Treating RN: 1956-09-07 (66 y.o. [name] Primary Care Gilliam Hawkes: [name] Other Clinician: Referring  Soniyah Mcglory: Treating Salomon Ganser/Extender: [name] in Treatment: 8 Vital Signs Time Taken: 14:42 Temperature (F): 97.9 Height (in): 70 Pulse (bpm): 73 Weight (lbs): 253 Respiratory Rate (breaths/min): 18 Body Mass Index (BMI): 36.3 Blood Pressure (mmHg): 111/72 Reference Range: 80 - 120 mg / dl Electronic Signature(s) Signed: 07/24/2022 8:52:44 AM By: Erenest Blank Entered By: Erenest Blank on 07/23/2022 14:43:07

## 2022-07-25 ENCOUNTER — Ambulatory Visit (INDEPENDENT_AMBULATORY_CARE_PROVIDER_SITE_OTHER): Payer: Medicare HMO

## 2022-07-25 DIAGNOSIS — I442 Atrioventricular block, complete: Secondary | ICD-10-CM

## 2022-07-25 LAB — CUP PACEART REMOTE DEVICE CHECK
Battery Remaining Longevity: 24 mo
Battery Voltage: 2.94 V
Brady Statistic AP VP Percent: 0 %
Brady Statistic AP VS Percent: 0 %
Brady Statistic AS VP Percent: 0 %
Brady Statistic AS VS Percent: 0 %
Brady Statistic RA Percent Paced: 0 %
Brady Statistic RV Percent Paced: 99.15 %
Date Time Interrogation Session: 20231011132404
HighPow Impedance: 92 Ohm
Implantable Lead Implant Date: 20181205
Implantable Lead Implant Date: 20181205
Implantable Lead Location: 753858
Implantable Lead Location: 753860
Implantable Lead Model: 4398
Implantable Pulse Generator Implant Date: 20181205
Lead Channel Impedance Value: 1083 Ohm
Lead Channel Impedance Value: 1083 Ohm
Lead Channel Impedance Value: 1197 Ohm
Lead Channel Impedance Value: 1254 Ohm
Lead Channel Impedance Value: 245.538
Lead Channel Impedance Value: 285 Ohm
Lead Channel Impedance Value: 292.657
Lead Channel Impedance Value: 312.941
Lead Channel Impedance Value: 322.197
Lead Channel Impedance Value: 399 Ohm
Lead Channel Impedance Value: 4047 Ohm
Lead Channel Impedance Value: 418 Ohm
Lead Channel Impedance Value: 456 Ohm
Lead Channel Impedance Value: 532 Ohm
Lead Channel Impedance Value: 760 Ohm
Lead Channel Impedance Value: 817 Ohm
Lead Channel Impedance Value: 893 Ohm
Lead Channel Impedance Value: 931 Ohm
Lead Channel Pacing Threshold Amplitude: 0.375 V
Lead Channel Pacing Threshold Pulse Width: 0.4 ms
Lead Channel Sensing Intrinsic Amplitude: 10.125 mV
Lead Channel Sensing Intrinsic Amplitude: 10.125 mV
Lead Channel Setting Pacing Amplitude: 2.25 V
Lead Channel Setting Pacing Amplitude: 2.5 V
Lead Channel Setting Pacing Pulse Width: 0.4 ms
Lead Channel Setting Pacing Pulse Width: 0.6 ms
Lead Channel Setting Sensing Sensitivity: 0.3 mV

## 2022-07-28 ENCOUNTER — Other Ambulatory Visit: Payer: Self-pay | Admitting: Internal Medicine

## 2022-07-30 ENCOUNTER — Other Ambulatory Visit: Payer: Self-pay | Admitting: Internal Medicine

## 2022-07-30 DIAGNOSIS — I255 Ischemic cardiomyopathy: Secondary | ICD-10-CM

## 2022-07-30 DIAGNOSIS — I428 Other cardiomyopathies: Secondary | ICD-10-CM

## 2022-07-30 DIAGNOSIS — I4819 Other persistent atrial fibrillation: Secondary | ICD-10-CM

## 2022-07-31 ENCOUNTER — Telehealth: Payer: Self-pay | Admitting: Internal Medicine

## 2022-07-31 NOTE — Telephone Encounter (Signed)
[facility] is needing an updated oxygen prescription sent electronically through epic for this patient.

## 2022-08-02 NOTE — Telephone Encounter (Signed)
Sorry I dont know how to send oxygen prescription by Epic as an outpatient (to anywhere)

## 2022-08-02 NOTE — Telephone Encounter (Signed)
Request for updated Rx for oxygen,LOV 06/29/22

## 2022-08-07 NOTE — Progress Notes (Signed)
No ICM remote transmission received for 07/30/2022 and next ICM transmission scheduled for 08/27/2022.   

## 2022-08-07 NOTE — Telephone Encounter (Signed)
Left message for a call back from kasmine at [facility].

## 2022-08-08 NOTE — Progress Notes (Signed)
Remote ICD transmission.   

## 2022-08-15 NOTE — Progress Notes (Signed)
[name] [place] Phone: [phone] Subjective:   [name], am serving as a scribe for Dr. Hulan Saas.  I'm seeing this patient by the request  of:  [name], MD  CC: bilateral shoulder pain   GDJ:[id number]  06/26/2022 Chronic problem with worsening symptoms again.  Responded well to the injections with some mild improvement in range of motion almost immediately.  Discussed with patient about icing regimen and home exercises, which activities to do and which ones to avoid.  Patient will increase activity as tolerated.  Follow-up again in 6 to 8 weeks otherwise.  Update 08/21/2022 [name] is a 66 y.o. male coming in with complaint of B shoulder pain. Patient states that he has also been having bilateral lower back pain X2 weeks. Bilateral shoulder pain L>R and is ready for an injection.        Past Medical History:  Diagnosis Date   AICD (automatic cardioverter/defibrillator) present    Anemia    supposed to be taking Vit B but doesn't   ANXIETY    takes Xanax nightly   Arthritis    Asthma    Albuterol prn and Advair daily;also takes Prednisone daily   Atrial fibrillation ([place]) 09/06/2015   Cardiomyopathy ([place])    a. EF 25% TEE July 2013; b. EF normalized 2015;  c. 03/2015 Echo: EF 40-45%, difrf HK, PASP 38 mmHg, Mild MR, sev LAE/RAE.   CHF (congestive heart failure) (HCC)    Chronic constipation    takes OTC stool softener   COPD (chronic obstructive pulmonary disease) (HCC)    "one dr says COPD; one dr says emphysema" (09/18/2017)   DEPRESSION    takes Zoloft and Doxepin daily   Diverticulitis    DYSKINESIA, ESOPHAGUS    Dysrhythmia    Atrial Fibrilation   Emphysema of lung [facility])    "one dr says COPD; one dr says emphysema" (09/18/2017)   Essential hypertension        FIBROMYALGIA    GERD (gastroesophageal reflux disease)        Glaucoma    HYPERLIPIDEMIA    a. Intolerant to  statins.   INSOMNIA    takes Ambien nightly   Myocardial infarction [facility])    a. 2012 Myoview notable for prior infarct;  b. 03/2015 Lexiscan CL: EF 37%, diff HK, small area of inferior infarct from apex to base-->Med Rx.   Myocardial infarction ([place])    O2 dependent    "2.5L q hs & prn" (09/18/2017)   Paroxysmal atrial fibrillation ([place])    a. CHA2DS2VASc = 3--> takes Coumadin;  b. 03/15/2015 Successful TEE/DCCV;  c. 03/2015 recurrent afib, Amio d/c'd in setting of hyperthyroidism.   Peripheral neuropathy    Pneumonia 12/2016   Rash and other nonspecific skin eruption 04/12/2009   no cause found saw dermatologists x 2 and allergist   SLEEP APNEA, OBSTRUCTIVE    a. doesn't use CPAP   Syncope    a. 03/2015 s/p MDT LINQ.   Type II diabetes mellitus ([place])        Past Surgical History:  Procedure Laterality Date   ACNE CYST REMOVAL     2 on back    AV NODE ABLATION N/A 10/25/2017   Procedure: AV NODE ABLATION;  Surgeon: Deboraha Sprang, MD;  Location: [place] [facility];  Service: Cardiovascular;  Laterality: N/A;   BIV ICD INSERTION CRT-D N/A 09/18/2017   Procedure: BIV  ICD INSERTION CRT-D;  Surgeon: Deboraha Sprang, MD;  Location: [place] [facility];  Service: Cardiovascular;  Laterality: N/A;   CARDIAC CATHETERIZATION N/A 03/21/2016   Procedure: Right/Left Heart Cath and Coronary Angiography;  Surgeon: Larey Dresser, MD;  Location: [place] [facility];  Service: Cardiovascular;  Laterality: N/A;   CARDIOVERSION  04/18/2012   Procedure: CARDIOVERSION;  Surgeon: Fay Records, MD;  Location: [place];  Service: Cardiovascular;  Laterality: N/A;   CARDIOVERSION  04/25/2012   Procedure: CARDIOVERSION;  Surgeon: Thayer Headings, MD;  Location: [place];  Service: Cardiovascular;  Laterality: N/A;   CARDIOVERSION  04/25/2012   Procedure: CARDIOVERSION;  Surgeon: Fay Records, MD;  Location: [place];  Service: Cardiovascular;  Laterality: N/A;   CARDIOVERSION  05/09/2012   Procedure: CARDIOVERSION;   Surgeon: Sherren Mocha, MD;  Location: [place];  Service: Cardiovascular;  Laterality: N/A;  changed from crenshaw to cooper by trish/leone-endo   CARDIOVERSION N/A 03/15/2015   Procedure: CARDIOVERSION;  Surgeon: Thayer Headings, MD;  Location: [place];  Service: Cardiovascular;  Laterality: N/A;   COLONOSCOPY     COLONOSCOPY WITH PROPOFOL N/A 10/21/2014   Procedure: COLONOSCOPY WITH PROPOFOL;  Surgeon: Ladene Artist, MD;  [facility];  Service: Endoscopy;  Laterality: N/A;   EP IMPLANTABLE DEVICE N/A 04/06/2015   Procedure: Loop Recorder Insertion;  Surgeon: Evans Lance, MD;  Location: [place] [facility];  Service: Cardiovascular;  Laterality: N/A;   ESOPHAGOGASTRODUODENOSCOPY     JOINT REPLACEMENT     LOOP RECORDER REMOVAL N/A 09/18/2017   Procedure: LOOP RECORDER REMOVAL;  Surgeon: Deboraha Sprang, MD;  Location: [place] [facility];  Service: Cardiovascular;  Laterality: N/A;   RIGHT/LEFT HEART CATH AND CORONARY ANGIOGRAPHY N/A 01/28/2017   Procedure: Right/Left Heart Cath and Coronary Angiography;  Surgeon: Larey Dresser, MD;  Location: [place] [facility];  Service: Cardiovascular;  Laterality: N/A;   TEE WITHOUT CARDIOVERSION  04/25/2012   Procedure: TRANSESOPHAGEAL ECHOCARDIOGRAM (TEE);  Surgeon: Thayer Headings, MD;  Location: [place];  Service: Cardiovascular;  Laterality: N/A;   TEE WITHOUT CARDIOVERSION N/A 03/15/2015   Procedure: TRANSESOPHAGEAL ECHOCARDIOGRAM (TEE);  Surgeon: Thayer Headings, MD;  Location: [place];  Service: Cardiovascular;  Laterality: N/A;   TONSILLECTOMY AND ADENOIDECTOMY     TOTAL KNEE ARTHROPLASTY Right 06/15/2014   Procedure: TOTAL KNEE ARTHROPLASTY;  Surgeon: Renette Butters, MD;  Location: [place];  Service: Orthopedics;  Laterality: Right;   TOTAL KNEE ARTHROPLASTY Left 10/17/2021   Procedure: Left TOTAL KNEE ARTHROPLASTY;  Surgeon: Mcarthur Rossetti, MD;  Location: [place];  Service: Orthopedics;  Laterality: Left;   Social  History   Socioeconomic History   Marital status: Divorced    Spouse name: Not on file   Number of children: 2   Years of education: Not on file   Highest education level: Not on file  Occupational History   Occupation: retired/disabled. prev worked in Therapist, sports.    Employer: DISABLED  Tobacco Use   Smoking status: Former    Packs/day: 2.00    Years: 30.00    Total pack years: 60.00    Types: Cigarettes    Quit date: 10/16/2007    Years since quitting: 14.8   Smokeless tobacco: Never  Vaping Use   Vaping Use: Never used  Substance and Sexual Activity   Alcohol use: No   Drug use: No   Sexual activity: Not Currently  Other Topics Concern  Not on file  Social History Narrative   Lives alone.   Social Determinants of Health   Financial Resource Strain: Low Risk  (06/29/2022)   Overall Financial Resource Strain (CARDIA)    Difficulty of Paying Living Expenses: Not hard at all  Food Insecurity: No Food Insecurity (06/29/2022)   Hunger Vital Sign    Worried About Running Out of Food in the Last Year: Never true    Ran Out of Food in the Last Year: Never true  Transportation Needs: No Transportation Needs (08/21/2022)   PRAPARE - Hydrologist (Medical): No    Lack of Transportation (Non-Medical): No  Physical Activity: Inactive (06/29/2022)   Exercise Vital Sign    Days of Exercise per Week: 0 days    Minutes of Exercise per Session: 0 min  Stress: No Stress Concern Present (06/29/2022)   [place]    Feeling of Stress : Not at all  Social Connections: Moderately Integrated (06/29/2022)   Social Connection and Isolation Panel [NHANES]    Frequency of Communication with Friends and Family: More than three times a week    Frequency of Social Gatherings with Friends and Family: More than three times a week    Attends Religious Services: More than 4 times per year    Active  Member of [employer] or Organizations: Yes    Attends Music therapist: More than 4 times per year    Marital Status: Divorced   Allergies  Allergen Reactions   Amiodarone Other (See Comments)    hyperthyroidism   Statins Other (See Comments)    myalgia   Tape Other (See Comments)    Skin Tears Use Paper Tape Only   Family History  Problem Relation Age of Onset   COPD Mother    Asthma Mother    Colon polyps Mother    Allergies Mother    Hypothyroidism Mother    Asthma Maternal Grandmother    Colon cancer Neg Hx     Current Outpatient Medications (Endocrine & Metabolic):    dapagliflozin propanediol (FARXIGA) 10 MG TABS tablet, Take 1 tablet (10 mg total) by mouth daily before breakfast.   predniSONE (DELTASONE) 20 MG tablet, Take 1 tablet (20 mg total) by mouth daily with breakfast.  Current Outpatient Medications (Cardiovascular):    eplerenone (INSPRA) 50 MG tablet, Take 1 tablet (50 mg total) by mouth daily.   Evolocumab (REPATHA SURECLICK) 500 MG/ML SOAJ, INJECT 1 PEN INTO THE SKIN EVERY 14 (FOURTEEN) DAYS.   losartan (COZAAR) 25 MG tablet, Take 1 tablet (25 mg total) by mouth daily.   metolazone (ZAROXOLYN) 2.5 MG tablet, Take 1 tablet (2.5 mg total) by mouth as directed. With additional 40 meq of potassium   torsemide (DEMADEX) 20 MG tablet, Take 2 tablets by mouth 2 times daily.   carvedilol (COREG) 6.25 MG tablet, Take 1 tablet (6.25 mg total) by mouth 2 (two) times daily.  Current Outpatient Medications (Respiratory):    albuterol (VENTOLIN HFA) 108 (90 Base) MCG/ACT inhaler, INHALE 2 PUFFS BY MOUTH 4 TIMES A DAY AS NEEDED   fluticasone (FLONASE) 50 MCG/ACT nasal spray, PLACE 2 SPRAYS INTO BOTH NOSTRILS DAILY AS NEEDED FOR ALLERGIES OR RHINITIS.  Current Outpatient Medications (Analgesics):    acetaminophen (TYLENOL) 500 MG tablet, Take 500 mg by mouth every 6 (six) hours as needed for moderate pain.   traMADol (ULTRAM) 50 MG tablet, TAKE 1 TABLET BY MOUTH  EVERY DAY AS NEEDED  Current Outpatient Medications (Hematological):    apixaban (ELIQUIS) 5 MG TABS tablet, Take 1 tablet (5 mg total) by mouth 2 (two) times daily.  Current Outpatient Medications (Other):    ALPRAZolam (XANAX) 0.5 MG tablet, Take 2 tablets (1 mg total) by mouth at bedtime.   augmented betamethasone dipropionate (DIPROLENE-AF) 0.05 % cream, Apply 1 application topically daily as needed (itching).   carisoprodol (SOMA) 350 MG tablet, TAKE 1 TABLET BY MOUTH THREE TIMES A DAY AS NEEDED FOR MUSCLE SPASM   cephALEXin (KEFLEX) 500 MG capsule, Take 1 capsule (500 mg total) by mouth 3 (three) times daily.   Cholecalciferol 100 MCG (4000 UT) CAPS, 1 tab by mouth once daily   doxepin (SINEQUAN) 10 MG capsule, TAKE 2 CAPSULES (20 MG TOTAL) BY MOUTH AT BEDTIME AS NEEDED.   Dupilumab (DUPIXENT) 300 MG/2ML SOPN, Inject 300 mg into the skin every 14 (fourteen) days.   fluticasone (CUTIVATE) 0.05 % cream, SMARTSIG:Topical 1-2 Times Daily PRN   hydrOXYzine (ATARAX/VISTARIL) 10 MG tablet, TAKE 1 TABLET BY MOUTH 3 TIMES DAILY AS NEEDED FOR ITCHING.   ketoconazole (NIZORAL) 2 % cream, APPLY 1 APPLICATION TOPICALLY DAILY   KLOR-CON M20 20 MEQ tablet, Take 20 mEq by mouth daily.   lidocaine (LIDODERM) 5 %, APPLY 1 PATCH TO AFFECTED AREA FOR UP TO 12 HOURS. DO NOT USE MORE THAN ONE PATCH IN 24 HOURS.   omeprazole (PRILOSEC) 20 MG capsule, TAKE 1 CAPSULE BY MOUTH TWICE A DAY   Potassium Chloride (KLOR-CON PO), Take 20 mcg by mouth daily.   silver sulfADIAZINE (SILVADENE) 1 % cream, APPLY TOPICALLY TO AFFECTED AREA EVERY DAY   tamsulosin (FLOMAX) 0.4 MG CAPS capsule, TAKE 1 CAPSULE BY MOUTH EVERY DAY   traZODone (DESYREL) 100 MG tablet, TAKE 2 TABLETS BY MOUTH AT BEDTIME AS NEEDED   TUMS 500 MG chewable tablet, Chew 500-2,000 mg by mouth every 4 (four) hours as needed for indigestion or heartburn.    venlafaxine XR (EFFEXOR XR) 150 MG 24 hr capsule, Take 1 capsule (150 mg total) by mouth daily with  breakfast.   zolpidem (AMBIEN) 10 MG tablet, TAKE 1 TABLET BY MOUTH EVERY DAY AT BEDTIME AS NEEDED   Reviewed prior external information including notes and imaging from  primary care provider As well as notes that were available from care everywhere and other healthcare systems.  Past medical history, social, surgical and family history all reviewed in electronic medical record.  No pertanent information unless stated regarding to the chief complaint.   Review of Systems:  No headache, visual changes, nausea, vomiting, diarrhea, constipation, dizziness, abdominal pain, skin rash, fevers, chills, night sweats, weight loss, swollen lymph nodes, body aches, joint swelling, chest pain, shortness of breath, mood changes. POSITIVE muscle aches  Objective  Blood pressure 110/82, pulse 73, height '5\' 9"'$  (1.753 m), SpO2 96 %.   General: No apparent distress alert and oriented x3 mood and affect normal, dressed appropriately.  HEENT: Pupils equal, extraocular movements intact  Respiratory: Patient's speak in full sentences and does not appear short of breath  Cardiovascular: No lower extremity edema, non tender, no erythema  Antalgic gait noted. Bilateral shoulders do have crepitus noted.  Atrophy noted of the shoulders bilaterally. Patient does have weakness of the rotator cuff but is symmetric Low back exam does have loss of lordosis.  Poor core strength noted.  Patient does have tightness with straight leg test.  Voluntary guarding noted.  Procedure: Real-time Ultrasound Guided Injection  of right glenohumeral joint Device: GE Logiq Q7  Ultrasound guided injection is preferred based studies that show increased duration, increased effect, greater accuracy, decreased procedural pain, increased response rate with ultrasound guided versus blind injection.  Verbal informed consent obtained.  Time-out conducted.  Noted no overlying erythema, induration, or other signs of local infection.  Skin  prepped in a sterile fashion.  Local anesthesia: Topical Ethyl chloride.  With sterile technique and under real time ultrasound guidance:  Joint visualized.  23g 1  inch needle inserted posterior approach. Pictures taken for needle placement. Patient did have injection of 2 cc of 0.5% Marcaine, and 1.0 cc of Kenalog 40 mg/dL. Completed without difficulty  Pain immediately improved suggesting accurate placement of the medication.  Advised to call if fevers/chills, erythema, induration, drainage, or persistent bleeding.  Impression: Technically successful ultrasound guided injection.  Procedure: Real-time Ultrasound Guided Injection of left glenohumeral joint Device: GE Logiq E  Ultrasound guided injection is preferred based studies that show increased duration, increased effect, greater accuracy, decreased procedural pain, increased response rate with ultrasound guided versus blind injection.  Verbal informed consent obtained.  Time-out conducted.  Noted no overlying erythema, induration, or other signs of local infection.  Skin prepped in a sterile fashion.  Local anesthesia: Topical Ethyl chloride.  With sterile technique and under real time ultrasound guidance:  Joint visualized.  21g 2 inch needle inserted posterior approach. Pictures taken for needle placement. Patient did have injection of 2 cc of 0.5% Marcaine, and 1cc of Kenalog 40 mg/dL. Completed without difficulty  Pain immediately improved suggesting accurate placement of the medication.  Advised to call if fevers/chills, erythema, induration, drainage, or persistent bleeding.  Images permanently stored and available for review in the ultrasound unit.  Impression: Technically successful ultrasound guided injection.    Impression and Recommendations:    The above documentation has been reviewed and is accurate and complete Lyndal Pulley, DO

## 2022-08-20 ENCOUNTER — Telehealth: Payer: Self-pay | Admitting: Licensed Clinical Social Worker

## 2022-08-20 NOTE — Patient Outreach (Signed)
  Care Coordination  Initial Visit Note   08/20/2022 Name: [name] MRN: [id number] DOB: 01-Jul-1956  [name] is a 66 y.o. year old male who sees Zachary Borg, MD for primary care. I spoke with  [name] by phone today.  What matters to the patients health and wellness today?  Phone appointment scheduled    Goals Addressed             This Visit's Progress    Care Coordination Activities       Care Coordination Interventions: Reviewed Care Coordination Services:phone appointment scheduled            SDOH assessments and interventions completed:  No   Care Coordination Interventions Activated:  Yes  Care Coordination Interventions:  No, not indicated   Follow up plan: Follow up call scheduled for 08/21/22    Encounter Outcome:  Pt. Visit Completed   [name], [place] [phone]

## 2022-08-20 NOTE — Patient Instructions (Signed)
    It was a pleasure speaking with you today. Per your request a Care Coordination phone appointment is scheduled 08/21/2022  [name], [place] [phone]

## 2022-08-21 ENCOUNTER — Ambulatory Visit: Payer: Medicare HMO | Admitting: Family Medicine

## 2022-08-21 ENCOUNTER — Ambulatory Visit: Payer: Self-pay | Admitting: Licensed Clinical Social Worker

## 2022-08-21 ENCOUNTER — Ambulatory Visit: Payer: Self-pay

## 2022-08-21 ENCOUNTER — Encounter: Payer: Self-pay | Admitting: Family Medicine

## 2022-08-21 ENCOUNTER — Ambulatory Visit (INDEPENDENT_AMBULATORY_CARE_PROVIDER_SITE_OTHER): Payer: Medicare HMO

## 2022-08-21 VITALS — BP 110/82 | HR 73 | Ht 69.0 in

## 2022-08-21 DIAGNOSIS — M5136 Other intervertebral disc degeneration, lumbar region: Secondary | ICD-10-CM | POA: Diagnosis not present

## 2022-08-21 DIAGNOSIS — M25512 Pain in left shoulder: Secondary | ICD-10-CM

## 2022-08-21 DIAGNOSIS — M4316 Spondylolisthesis, lumbar region: Secondary | ICD-10-CM | POA: Diagnosis not present

## 2022-08-21 DIAGNOSIS — M5416 Radiculopathy, lumbar region: Secondary | ICD-10-CM | POA: Diagnosis not present

## 2022-08-21 DIAGNOSIS — M12811 Other specific arthropathies, not elsewhere classified, right shoulder: Secondary | ICD-10-CM

## 2022-08-21 DIAGNOSIS — M545 Low back pain, unspecified: Secondary | ICD-10-CM

## 2022-08-21 DIAGNOSIS — M25511 Pain in right shoulder: Secondary | ICD-10-CM | POA: Diagnosis not present

## 2022-08-21 DIAGNOSIS — M12812 Other specific arthropathies, not elsewhere classified, left shoulder: Secondary | ICD-10-CM | POA: Diagnosis not present

## 2022-08-21 DIAGNOSIS — F419 Anxiety disorder, unspecified: Secondary | ICD-10-CM

## 2022-08-21 MED ORDER — PREDNISONE 20 MG PO TABS: 20.0000 mg | ORAL_TABLET | Freq: Two times a day (BID) | ORAL | 0 refills | Status: DC

## 2022-08-21 MED ORDER — PREDNISONE 20 MG PO TABS: 20.0000 mg | ORAL_TABLET | Freq: Every day | ORAL | 0 refills | Status: DC

## 2022-08-21 NOTE — Patient Instructions (Addendum)
Good to see you Get x ray on your way out Bilateral shoulder injections given today Prednisone sent in  Follow up in 2-3 months

## 2022-08-21 NOTE — Assessment & Plan Note (Signed)
Bilateral shoulders injected again today.  Tolerated the procedure well.  Patient does have severe arthritic changes.  Would not want surgical intervention if he can help it.  Chronic problem with worsening symptoms.  Follow-up with me again in 8 to 10 weeks.

## 2022-08-21 NOTE — Patient Instructions (Signed)
Visit Information  Thank you for taking time to visit with me today. Please don't hesitate to contact me if I can be of assistance to you.   Following are the goals we discussed today:   Goals Addressed             This Visit's Progress    Care Coordination Activities       Care Coordination Interventions: Reviewed Care Coordination Services:  Motivational Interviewing employed Solution-Focused Strategies employed:  Active listening / Reflection utilized  Provided general psycho-education for mental health needs  Reviewed mental health medications and discussed importance of compliance: reports no missed dose taking as per PCP Participation in counseling encouraged :   Discussed referral options to connect for ongoing therapy: would like [name] with Dr. Jenny [name] Made referral to [facility] next appointment is by telephone on 09/10/2022 at 2:30  Please call the care guide team at [phone] if you need to cancel or reschedule your appointment.   If you are experiencing a Mental Health or [place] or need someone to talk to, please call the Suicide and Crisis Lifeline: 988 call the [place] National Suicide Prevention Lifeline: 817-056-0726 or TTY: (318) 572-6855 TTY 737-317-2634) to talk to a trained counselor call 1-800-273-TALK (toll free, 24 hour hotline) go to [facility] [address] [place] [phone])   Patient verbalizes understanding of instructions and care plan provided today and agrees to view in [place]. Active MyChart status and patient understanding of how to access instructions and care plan via MyChart confirmed with patient.     ;

## 2022-08-21 NOTE — Patient Outreach (Signed)
  Care Coordination  Initial Visit Note   08/21/2022 Name: [name] MRN: [id number] DOB: 11/30/1955  [name] is a 66 y.o. year old male who sees [name], MD for primary care. I spoke with  [name] by phone today.  What matters to the patients health and wellness today?  Managing his pain  Patient continues to experience difficulty with symptoms of depression, which seems to be exacerbated by not having his own housing and managing chronic health conditions..   Recommendation: Patient may benefit from, and is in agreement to Allow LCSW to make referral for counseling.     Goals Addressed             This Visit's Progress    Care Coordination Activities       Care Coordination Interventions: Reviewed Care Coordination Services:  Motivational Interviewing employed Solution-Focused Strategies employed:  Active listening / Reflection utilized  Provided general psycho-education for mental health needs  Reviewed mental health medications and discussed importance of compliance: reports no missed dose taking as per PCP Participation in counseling encouraged :   Discussed referral options to connect for ongoing therapy: would like [place] with Dr. [name] Made referral to [facility]          SDOH assessments and interventions completed:  Yes  SDOH Interventions Today    Flowsheet Row Most Recent Value  SDOH Interventions   Housing Interventions Intervention Not Indicated  [on the wait list for housing]  Transportation Interventions Intervention Not Indicated       Care Coordination Interventions Activated:  Yes  Care Coordination Interventions:  Yes, provided   Follow up plan: Follow up call scheduled for 09/10/2022    Encounter Outcome:  Pt. Visit Completed   Casimer Lanius, [place] [phone]

## 2022-08-21 NOTE — Assessment & Plan Note (Signed)
Chronic problem with potentially worsening symptoms.  Patient does have significant arthritic changes of the lower back previously with a spondylolisthesis.  We will see if there is any worsening changes at this time.  If patient has any weakness of the lower extremities we do need to consider advanced imaging.  Hopefully patient will improve like he did not multiple years ago.  Follow-up again in 4 to 6 weeks.  Prednisone given 20 mg daily for the next 7 days.

## 2022-08-22 ENCOUNTER — Other Ambulatory Visit: Payer: Self-pay | Admitting: Cardiology

## 2022-08-22 DIAGNOSIS — I2510 Atherosclerotic heart disease of native coronary artery without angina pectoris: Secondary | ICD-10-CM

## 2022-08-22 DIAGNOSIS — E7849 Other hyperlipidemia: Secondary | ICD-10-CM

## 2022-08-26 ENCOUNTER — Other Ambulatory Visit: Payer: Self-pay | Admitting: Internal Medicine

## 2022-08-27 ENCOUNTER — Ambulatory Visit (INDEPENDENT_AMBULATORY_CARE_PROVIDER_SITE_OTHER): Payer: Medicare HMO

## 2022-08-27 ENCOUNTER — Telehealth: Payer: Self-pay | Admitting: Internal Medicine

## 2022-08-27 DIAGNOSIS — J441 Chronic obstructive pulmonary disease with (acute) exacerbation: Secondary | ICD-10-CM

## 2022-08-27 DIAGNOSIS — Z95810 Presence of automatic (implantable) cardiac defibrillator: Secondary | ICD-10-CM | POA: Diagnosis not present

## 2022-08-27 DIAGNOSIS — I5022 Chronic systolic (congestive) heart failure: Secondary | ICD-10-CM | POA: Diagnosis not present

## 2022-08-27 NOTE — Telephone Encounter (Signed)
[employer] is requesting verbal orders for new order for oxygen - he is a [facility] patient - Order can be updated through [employer] can take a look at it.

## 2022-08-28 NOTE — Telephone Encounter (Signed)
Called pt double check dosage on oxygen. Pt states it use to be 2L, but he haven't use oxygen in years, and he does not need. Called Mardene Celeste inform her what patient states and I cancel order for oxygen.Marland KitchenJohny [name]

## 2022-08-28 NOTE — Telephone Encounter (Signed)
Still unable bc I dont know how much o2 he is taking, or is it all the time or just at night....  so many questions

## 2022-08-28 NOTE — Telephone Encounter (Signed)
Called [employer] gave MD response. She states MD would have to just put new order in for oxygen. Pt is no longer with the company that use to provide him oxygen. Place order for oxygen.Marland KitchenJohny Chess

## 2022-08-28 NOTE — Telephone Encounter (Signed)
Unable to do this as I would not how to "update through [employer] to [employer], and I have no idea what the instructions for the oxygen is supposed to be

## 2022-08-29 ENCOUNTER — Other Ambulatory Visit (HOSPITAL_COMMUNITY): Payer: Self-pay | Admitting: Family Medicine

## 2022-08-29 ENCOUNTER — Other Ambulatory Visit: Payer: Self-pay | Admitting: Internal Medicine

## 2022-08-29 ENCOUNTER — Other Ambulatory Visit (HOSPITAL_COMMUNITY): Payer: Self-pay | Admitting: Cardiology

## 2022-08-29 DIAGNOSIS — Z8709 Personal history of other diseases of the respiratory system: Secondary | ICD-10-CM

## 2022-08-29 DIAGNOSIS — J3089 Other allergic rhinitis: Secondary | ICD-10-CM

## 2022-08-29 DIAGNOSIS — I5032 Chronic diastolic (congestive) heart failure: Secondary | ICD-10-CM

## 2022-08-30 ENCOUNTER — Other Ambulatory Visit (HOSPITAL_COMMUNITY): Payer: Self-pay

## 2022-08-30 ENCOUNTER — Telehealth: Payer: Self-pay

## 2022-08-30 MED ORDER — LOSARTAN POTASSIUM 25 MG PO TABS: 25.0000 mg | ORAL_TABLET | Freq: Every day | ORAL | 11 refills | Status: DC

## 2022-08-30 NOTE — Telephone Encounter (Signed)
Mr Mankey agreed to send the transmission in a few minutes.

## 2022-08-31 ENCOUNTER — Telehealth: Payer: Self-pay

## 2022-08-31 NOTE — Progress Notes (Signed)
EPIC Encounter for ICM Monitoring  Patient Name: [name] is a 66 y.o. male Date: 08/31/2022 Primary Care Physican: [name], MD Primary Cardiologist: [name] Electrophysiologist: Vergie Living Pacing: 99.8%    11/22/2021 Weight: 244.5 lbs 04/10/2022 Weight: 245.8 lbs     Attempted call to patient and unable to reach.  Left detailed message per DPR regarding transmission. Transmission reviewed.    Optivol thoracic impedance normal but was suggesting possible fluid accumulation from 10/25-11/9.   Prescribed:  Torsemide 20 mg to 2 tablets (40 mg total) by mouth twice a day.   Potassium 20 mEq take 1 tablets (20 mEq total) daily   Labs: 03/30/2022 Creatinine 1.37, BUN 29, Potassium 44, Sodium 140 03/20/2022 Creatinine 1.51, BUN 36, Potassium 4.5, Sodium 135  12/20/2021 Creatinine 1.00, BUN 27, Potassium 4.3, Sodium 140, GFR 78.15 12/15/2021 Creatinine 1.05, BUN 23, Potassium 4.5, Sodium 137, GFR >60 A complete set of results can be found in Results Review.   Recommendations:  Left voice mail with ICM number and encouraged to call if experiencing any fluid symptoms.   Follow-up plan: ICM clinic phone appointment on 10/01/2022.   91 day device clinic remote transmission 10/24/2022.   EP/Cardiology Office Visits:   Recall 07/20/2022 with Dr [name].  Recall 10/28/2022 with Dr [name].      Copy of ICM check sent to Dr. [name].      3 month ICM trend: 08/30/2022.    12-14 Month ICM trend:     [name], RN 08/31/2022 4:19 PM

## 2022-08-31 NOTE — Telephone Encounter (Signed)
Remote ICM transmission received.  Attempted call to patient regarding ICM remote transmission and left detailed message per DPR.  Advised to return call for any fluid symptoms or questions. Next ICM remote transmission scheduled 10/01/2022.

## 2022-09-10 ENCOUNTER — Ambulatory Visit: Payer: Self-pay | Admitting: Licensed Clinical Social Worker

## 2022-09-10 NOTE — Patient Instructions (Signed)
Visit Information  Thank you for taking time to visit with me today. Please don't hesitate to contact me if I can be of assistance to you.   Following are the goals we discussed today:   Goals Addressed             This Visit's Progress    Care Coordination Activities       Care Coordination Interventions: Assessed Social Determinants of Health Solution-Focused Strategies employed:  Active listening / Reflection utilized  Emotional Support Provided Provided general psycho-education for mental health needs  Reviewed mental health medications and discussed importance of compliance: reports no missed dose taking as per PCP Participation in counseling encouraged : has initial appointment 10/02/22  Housing  (provided resource for [employer] and [facility] [phone]  )  Collaborate with BSW for other housing options          Our next appointment is by telephone on 10/03/22 at 2:00  Please call the care guide team at [phone] if you need to cancel or reschedule your appointment.   If you are experiencing a Mental Health or [place] or need someone to talk to, please call the Suicide and Crisis Lifeline: 988 call the [place] National Suicide Prevention Lifeline: 636-205-9974 or TTY: 8454329517 TTY [phone]) to talk to a trained counselor call 1-800-273-TALK (toll free, 24 hour hotline) go to [facility] [address] [place] [phone])   Patient verbalizes understanding of instructions and care plan provided today and agrees to view in [place]. Active MyChart status and patient understanding of how to access instructions and care plan via MyChart confirmed with patient.     Casimer Lanius, [place] [phone]

## 2022-09-10 NOTE — Patient Outreach (Deleted)
{  CARE COORDINATION NOTES:27886} 

## 2022-09-10 NOTE — Patient Outreach (Signed)
  Care Coordination   Follow Up Visit Note   09/10/2022 Name: [name] MRN: [id number] DOB: Oct 23, 1955  [name] is a 67 y.o. year old male who sees [name], MD for primary care. I spoke with  [name] by phone today.  What matters to the patients health and wellness today?  Housing instability   Patient currently lives with dau. the lease ends in Dec and he will need to find housing .   Recommendation: Patient may benefit from, and is in agreement to To follow up on resources provided.     Goals Addressed             This Visit's Progress    Care Coordination Activities       Care Coordination Interventions: Assessed Social Determinants of Health Solution-Focused Strategies employed:  Active listening / Reflection utilized  Emotional Support Provided Provided general psycho-education for mental health needs  Reviewed mental health medications and discussed importance of compliance: reports no missed dose taking as per PCP Participation in counseling encouraged : has initial appointment 10/02/22  Housing  (provided resource for [employer] and [facility] [phone]  )  Collaborate with BSW for other housing options          SDOH assessments and interventions completed:  Yes  SDOH Interventions Today    Flowsheet Row Most Recent Value  SDOH Interventions   Housing Interventions Other (Comment)  [resources provided]        Care Coordination Interventions:  Yes, provided   Follow up plan:  Follow up call scheduled for 10/03/22 Consultation with BSW for additional housing options    Encounter Outcome:  Pt. Visit Completed   Casimer Lanius, [place] [phone]

## 2022-09-13 ENCOUNTER — Other Ambulatory Visit: Payer: Self-pay | Admitting: Internal Medicine

## 2022-09-19 ENCOUNTER — Telehealth (HOSPITAL_COMMUNITY): Payer: Self-pay | Admitting: *Deleted

## 2022-09-19 NOTE — Telephone Encounter (Signed)
Pt called c/o swelling in his hands and abdomen. Pt asked if he could increase his medications.

## 2022-09-19 NOTE — Telephone Encounter (Signed)
If he is taking torsemide 60 mg bid, increase to 80 mg bid x 2 days then back to 60 bid.  Needs to make appt with APP.

## 2022-09-19 NOTE — Telephone Encounter (Signed)
Increase torsemide to 80 mg daily for now and get followup.

## 2022-09-19 NOTE — Telephone Encounter (Signed)
Pt has only been taking torsemide '60mg'$  daily.

## 2022-09-20 NOTE — Telephone Encounter (Signed)
Spoke with patient and instructed on medication changes. He understood. We set up follow up appointment.

## 2022-09-23 ENCOUNTER — Other Ambulatory Visit (HOSPITAL_COMMUNITY): Payer: Self-pay | Admitting: Family Medicine

## 2022-09-23 ENCOUNTER — Other Ambulatory Visit (HOSPITAL_COMMUNITY): Payer: Self-pay | Admitting: Cardiology

## 2022-09-24 ENCOUNTER — Encounter: Payer: Self-pay | Admitting: Family Medicine

## 2022-09-24 ENCOUNTER — Telehealth: Payer: Self-pay | Admitting: Family Medicine

## 2022-09-24 ENCOUNTER — Ambulatory Visit (INDEPENDENT_AMBULATORY_CARE_PROVIDER_SITE_OTHER): Payer: Medicare HMO | Admitting: Family Medicine

## 2022-09-24 VITALS — BP 136/84 | HR 69 | Temp 97.6°F | Wt 274.8 lb

## 2022-09-24 DIAGNOSIS — R059 Cough, unspecified: Secondary | ICD-10-CM

## 2022-09-24 DIAGNOSIS — J441 Chronic obstructive pulmonary disease with (acute) exacerbation: Secondary | ICD-10-CM | POA: Diagnosis not present

## 2022-09-24 LAB — POCT INFLUENZA A/B
Influenza A, POC: NEGATIVE
Influenza B, POC: NEGATIVE

## 2022-09-24 LAB — POC COVID19 BINAXNOW: SARS Coronavirus 2 Ag: NEGATIVE

## 2022-09-24 MED ORDER — PREDNISONE 50 MG PO TABS: ORAL_TABLET | ORAL | 0 refills | Status: DC

## 2022-09-24 MED ORDER — PROMETHAZINE-DM 6.25-15 MG/5ML PO SYRP: 5.0000 mL | ORAL_SOLUTION | Freq: Four times a day (QID) | ORAL | 0 refills | Status: DC | PRN

## 2022-09-24 MED ORDER — IPRATROPIUM-ALBUTEROL 0.5-2.5 (3) MG/3ML IN SOLN: 3.0000 mL | RESPIRATORY_TRACT | 0 refills | Status: DC | PRN

## 2022-09-24 MED ORDER — DOXYCYCLINE HYCLATE 100 MG PO TABS: 100.0000 mg | ORAL_TABLET | Freq: Two times a day (BID) | ORAL | 0 refills | Status: AC

## 2022-09-24 MED ORDER — METHYLPREDNISOLONE SODIUM SUCC 40 MG IJ SOLR
40.0000 mg | Freq: Once | INTRAMUSCULAR | Status: AC
  Administered 2022-09-24: 40 mg via INTRAMUSCULAR

## 2022-09-24 MED ORDER — FULL KIT NEBULIZER SET MISC: 1.0000 | 0 refills | Status: DC | PRN

## 2022-09-24 MED ORDER — CEFTRIAXONE SODIUM 1 G IJ SOLR
1.0000 g | Freq: Once | INTRAMUSCULAR | Status: AC
  Administered 2022-09-24: 1 g via INTRAMUSCULAR

## 2022-09-24 MED ORDER — AMOXICILLIN-POT CLAVULANATE 875-125 MG PO TABS: 1.0000 | ORAL_TABLET | Freq: Two times a day (BID) | ORAL | 0 refills | Status: AC

## 2022-09-24 NOTE — Telephone Encounter (Signed)
Rx faxed to [facility]. Patient is aware of annotation below and verbalized understanding.

## 2022-09-24 NOTE — Telephone Encounter (Signed)
Caller Name: Erbie Arment Call back phone #: [phone]  Reason for Call: Nebulizer was called into [facility], they do not carry the machine and told pt it needs to be sent to a medical supply. Pt has been to  [facility] on [address], [place], [place] [address]

## 2022-09-24 NOTE — Progress Notes (Unsigned)
Assessment/Plan:   Problem List Items Addressed This Visit   None Visit Diagnoses     Cough, unspecified type    -  Primary   Relevant Orders   POC COVID-19   POCT Influenza A/B          Subjective:  HPI:  ZYGMUNT MCGLINN is a 66 y.o. male who has Morbid obesity ([place]); Anxiety; Depression; Obstructive sleep apnea; Chronic pain syndrome; Essential hypertension; GERD (gastroesophageal reflux disease); Benign prostatic hyperplasia; INSOMNIA; NEPHROLITHIASIS, HX OF; UNSPECIFIED URETHRAL STRICTURE; Past myocardial infarction; Spongiotic dermatitis; Anticoagulated on warfarin; Nonischemic cardiomyopathy ([place]); Bundle branch block, right and extreme left anterior fascicular; COPD (chronic obstructive pulmonary disease) ([place]); Thoracic spondylosis without myelopathy; Lumbosacral spondylosis without myelopathy; Degenerative joint disease of knee, left; Thoracic degenerative disc disease; Encounter for therapeutic drug monitoring; Osteoarthritis of right knee; De Quervain's tenosynovitis, right; Hx of adenomatous colonic polyps; Anemia; Thyrotoxicosis; Persistent atrial fibrillation ([place]); Syncope; Other primary cardiomyopathies; Hyperlipidemia; Encounter for well adult exam with abnormal findings; Microhematuria; History of nasal polyposis; Chronic rhinitis; Glaucoma; Right leg swelling; Right lumbar radiculopathy; Trigger point of thoracic region; Increased prostate specific antigen (PSA) velocity; Right rotator cuff tear arthropathy; Rotator cuff arthropathy, left; Pain due to total knee replacement ([place]); Chronic bursitis of left shoulder; 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); Rotator cuff arthropathy of both shoulders; Statin  myopathy; Laceration of left leg; Myalgia; Vitamin D deficiency; Preop exam for internal medicine; B12 deficiency; Hemorrhagic shock ([place]); Gastrointestinal hemorrhage; Diverticulitis of colon; Former tobacco use; Acute on chronic diastolic heart failure ([place]); Type 2 diabetes mellitus ([place]); Atrial fibrillation ([place]); Pain and swelling of right lower leg; Tooth fracture; Bursitis of left hip; Acute cough; History of colonic polyps; Rib pain on right side; Unilateral primary osteoarthritis, left knee; Status post total knee replacement, left; Gluteal tendinitis of left buttock; Intertrigo; ED (erectile dysfunction); AC (acromioclavicular) arthritis; Cataract, nuclear sclerotic, left eye; Anxiety and depression; and Diabetic leg ulcer ([place]) on their problem list..   He  has a past medical history of AICD (automatic cardioverter/defibrillator) present, Anemia, ANXIETY, Arthritis, Asthma, Atrial fibrillation ([place]) (09/06/2015), Cardiomyopathy ([place]), CHF (congestive heart failure) ([place]), Chronic constipation, COPD (chronic obstructive pulmonary disease) ([place]), DEPRESSION, Diverticulitis, DYSKINESIA, ESOPHAGUS, Dysrhythmia, Emphysema of lung ([place]), Essential hypertension, FIBROMYALGIA, GERD (gastroesophageal reflux disease), Glaucoma, HYPERLIPIDEMIA, INSOMNIA, Myocardial infarction [facility]), Myocardial infarction ([place]), O2 dependent, Paroxysmal atrial fibrillation ([place]), Peripheral neuropathy, Pneumonia (12/2016), Rash and other nonspecific skin eruption (04/12/2009), SLEEP APNEA, OBSTRUCTIVE, Syncope, and Type II diabetes mellitus ([place]).[name]   He presents with chief complaint of Cough (Cough x 1 week ) .   Cough: Patient complains of nasal congestion and productive cough with sputum described as white.  Symptoms began 1 week ago staying constant.  The cough is with wheezing, with shortness of breath and is aggravated by nothing Patient {does/do/not:33181} have new pets. Patient {does/do/not:33181} have a history of  asthma. Patient {does/do/not:33181} have a history of environmental allergens. Patient {has/not:15037} recent travel. Patient {does/do/not:33181} have a history of smoking. Patient  {has/not:15037} previous Chest X-ray. Patient {has/not:15037} had a PPD done.  Has been using albuterol 3-4 times a day  Denies chest pain at the moment, history of COPD, no fevers,   Mild increased work of breathing, wheezing throughout Past Surgical History:  Procedure Laterality Date   ACNE CYST REMOVAL  2 on back    AV NODE ABLATION N/A 10/25/2017   Procedure: AV NODE ABLATION;  Surgeon: Deboraha Sprang, MD;  Location: [facility];  Service: Cardiovascular;  Laterality: N/A;   BIV ICD INSERTION CRT-D N/A 09/18/2017   Procedure: BIV ICD INSERTION CRT-D;  Surgeon: Deboraha Sprang, MD;  Location: [place] [facility];  Service: Cardiovascular;  Laterality: N/A;   CARDIAC CATHETERIZATION N/A 03/21/2016   Procedure: Right/Left Heart Cath and Coronary Angiography;  Surgeon: Larey Dresser, MD;  Location: [facility];  Service: Cardiovascular;  Laterality: N/A;   CARDIOVERSION  04/18/2012   Procedure: CARDIOVERSION;  Surgeon: Fay Records, MD;  Location: [facility];  Service: Cardiovascular;  Laterality: N/A;   CARDIOVERSION  04/25/2012   Procedure: CARDIOVERSION;  Surgeon: Thayer Headings, MD;  Location: [facility];  Service: Cardiovascular;  Laterality: N/A;   CARDIOVERSION  04/25/2012   Procedure: CARDIOVERSION;  Surgeon: Fay Records, MD;  Location: [facility];  Service: Cardiovascular;  Laterality: N/A;   CARDIOVERSION  05/09/2012   Procedure: CARDIOVERSION;  Surgeon: Sherren Mocha, MD;  Location: [facility];  Service: Cardiovascular;  Laterality: N/A;  changed from crenshaw to cooper by trish/leone-endo   CARDIOVERSION N/A 03/15/2015   Procedure: CARDIOVERSION;  Surgeon: Thayer Headings, MD;  Location: [facility];  Service: Cardiovascular;  Laterality: N/A;   COLONOSCOPY     COLONOSCOPY WITH PROPOFOL N/A  10/21/2014   Procedure: COLONOSCOPY WITH PROPOFOL;  Surgeon: Ladene Artist, MD;  Location: [facility];  Service: Endoscopy;  Laterality: N/A;   EP IMPLANTABLE DEVICE N/A 04/06/2015   Procedure: Loop Recorder Insertion;  Surgeon: Evans Lance, MD;  Location: [facility];  Service: Cardiovascular;  Laterality: N/A;   ESOPHAGOGASTRODUODENOSCOPY     JOINT REPLACEMENT     LOOP RECORDER REMOVAL N/A 09/18/2017   Procedure: LOOP RECORDER REMOVAL;  Surgeon: Deboraha Sprang, MD;  Location: [facility];  Service: Cardiovascular;  Laterality: N/A;   RIGHT/LEFT HEART CATH AND CORONARY ANGIOGRAPHY N/A 01/28/2017   Procedure: Right/Left Heart Cath and Coronary Angiography;  Surgeon: Larey Dresser, MD;  Location: [facility];  Service: Cardiovascular;  Laterality: N/A;   TEE WITHOUT CARDIOVERSION  04/25/2012   Procedure: TRANSESOPHAGEAL ECHOCARDIOGRAM (TEE);  Surgeon: Thayer Headings, MD;  Location: [facility];  Service: Cardiovascular;  Laterality: N/A;   TEE WITHOUT CARDIOVERSION N/A 03/15/2015   Procedure: TRANSESOPHAGEAL ECHOCARDIOGRAM (TEE);  Surgeon: Thayer Headings, MD;  Location: [facility];  Service: Cardiovascular;  Laterality: N/A;   TONSILLECTOMY AND ADENOIDECTOMY     TOTAL KNEE ARTHROPLASTY Right 06/15/2014   Procedure: TOTAL KNEE ARTHROPLASTY;  Surgeon: Renette Butters, MD;  Location: [facility];  Service: Orthopedics;  Laterality: Right;   TOTAL KNEE ARTHROPLASTY Left 10/17/2021   Procedure: Left TOTAL KNEE ARTHROPLASTY;  Surgeon: Mcarthur Rossetti, MD;  Location: [facility];  Service: Orthopedics;  Laterality: Left;    Outpatient Medications Prior to Visit  Medication Sig Dispense Refill   acetaminophen (TYLENOL) 500 MG tablet Take 500 mg by mouth every 6 (six) hours as needed for moderate pain.     albuterol (VENTOLIN HFA) 108 (90 Base) MCG/ACT inhaler INHALE 2 PUFFS BY MOUTH 4 TIMES A DAY AS NEEDED 54 each 3   ALPRAZolam (XANAX) 0.5 MG tablet Take 2 tablets (1 mg total)  by mouth at bedtime. 60 tablet 5   apixaban (ELIQUIS) 5 MG TABS tablet Take 1 tablet (5 mg total) by mouth 2 (  two) times daily. 60 tablet 11   augmented betamethasone dipropionate (DIPROLENE-AF) 0.05 % cream Apply 1 application topically daily as needed (itching).     carisoprodol (SOMA) 350 MG tablet TAKE 1 TABLET BY MOUTH THREE TIMES A DAY AS NEEDED FOR MUSCLE SPASM 90 tablet 2   carvedilol (COREG) 6.25 MG tablet TAKE 1 TABLET BY MOUTH TWICE A DAY 180 tablet 3   cephALEXin (KEFLEX) 500 MG capsule Take 1 capsule (500 mg total) by mouth 3 (three) times daily. 30 capsule 0   Cholecalciferol 100 MCG (4000 UT) CAPS 1 tab by mouth once daily 30 capsule 99   dapagliflozin propanediol (FARXIGA) 10 MG TABS tablet Take 1 tablet (10 mg total) by mouth daily before breakfast. 90 tablet 3   doxepin (SINEQUAN) 10 MG capsule TAKE 2 CAPSULES (20 MG TOTAL) BY MOUTH AT BEDTIME AS NEEDED. 180 capsule 1   Dupilumab (DUPIXENT) 300 MG/2ML SOPN Inject 300 mg into the skin every 14 (fourteen) days.     eplerenone (INSPRA) 25 MG tablet TAKE 1 TABLET (25 MG TOTAL) BY MOUTH DAILY. 90 tablet 3   eplerenone (INSPRA) 50 MG tablet Take 1 tablet (50 mg total) by mouth daily. 90 tablet 3   Evolocumab (REPATHA SURECLICK) 903 MG/ML SOAJ INJECT 1 PEN INTO THE SKIN EVERY 14 (FOURTEEN) DAYS. 6 mL 3   fluticasone (CUTIVATE) 0.05 % cream SMARTSIG:Topical 1-2 Times Daily PRN     fluticasone (FLONASE) 50 MCG/ACT nasal spray PLACE 2 SPRAYS INTO BOTH NOSTRILS DAILY AS NEEDED FOR ALLERGIES OR RHINITIS. 48 mL 1   hydrOXYzine (ATARAX/VISTARIL) 10 MG tablet TAKE 1 TABLET BY MOUTH 3 TIMES DAILY AS NEEDED FOR ITCHING. 60 tablet 2   ketoconazole (NIZORAL) 2 % cream APPLY 1 APPLICATION TOPICALLY DAILY 30 g 1   KLOR-CON M20 20 MEQ tablet Take 20 mEq by mouth daily.     lidocaine (LIDODERM) 5 % APPLY 1 PATCH TO AFFECTED AREA FOR UP TO 12 HOURS. DO NOT USE MORE THAN ONE PATCH IN 24 HOURS. 90 patch 2   losartan (COZAAR) 25 MG tablet Take 1 tablet (25  mg total) by mouth daily. 30 tablet 11   metolazone (ZAROXOLYN) 2.5 MG tablet Take 1 tablet (2.5 mg total) by mouth as directed. With additional 40 meq of potassium 5 tablet 0   omeprazole (PRILOSEC) 20 MG capsule TAKE 1 CAPSULE BY MOUTH TWICE A DAY 180 capsule 1   Potassium Chloride (KLOR-CON PO) Take 20 mcg by mouth daily.     predniSONE (DELTASONE) 20 MG tablet Take 1 tablet (20 mg total) by mouth daily with breakfast. 7 tablet 0   silver sulfADIAZINE (SILVADENE) 1 % cream APPLY TOPICALLY TO AFFECTED AREA EVERY DAY 50 g 3   tamsulosin (FLOMAX) 0.4 MG CAPS capsule TAKE 1 CAPSULE BY MOUTH EVERY DAY 90 capsule 3   torsemide (DEMADEX) 20 MG tablet TAKE 3 TABLETS (60 MG TOTAL) BY MOUTH 2 (TWO) TIMES DAILY. 540 tablet 1   traMADol (ULTRAM) 50 MG tablet TAKE 1 TABLET BY MOUTH EVERY DAY AS NEEDED 30 tablet 2   traZODone (DESYREL) 100 MG tablet TAKE 2 TABLETS BY MOUTH AT BEDTIME AS NEEDED 180 tablet 1   TUMS 500 MG chewable tablet Chew 500-2,000 mg by mouth every 4 (four) hours as needed for indigestion or heartburn.      venlafaxine XR (EFFEXOR XR) 150 MG 24 hr capsule Take 1 capsule (150 mg total) by mouth daily with breakfast. 90 capsule 3   zolpidem (AMBIEN) 10 MG  tablet TAKE 1 TABLET BY MOUTH EVERY DAY AT BEDTIME AS NEEDED 90 tablet 1   No facility-administered medications prior to visit.    Family History  Problem Relation Age of Onset   COPD Mother    Asthma Mother    Colon polyps Mother    Allergies Mother    Hypothyroidism Mother    Asthma Maternal Grandmother    Colon cancer Neg Hx     Social History   Socioeconomic History   Marital status: Divorced    Spouse name: Not on file   Number of children: 2   Years of education: Not on file   Highest education level: Not on file  Occupational History   Occupation: retired/disabled. prev worked in Therapist, sports.    Employer: DISABLED  Tobacco Use   Smoking status: Former    Packs/day: 2.00    Years: 30.00    Total pack  years: 60.00    Types: Cigarettes    Quit date: 10/16/2007    Years since quitting: 14.9   Smokeless tobacco: Never  Vaping Use   Vaping Use: Never used  Substance and Sexual Activity   Alcohol use: No   Drug use: No   Sexual activity: Not Currently  Other Topics Concern   Not on file  Social History Narrative   Lives alone.   Social Determinants of Health   Financial Resource Strain: Low Risk  (06/29/2022)   Overall Financial Resource Strain (CARDIA)    Difficulty of Paying Living Expenses: Not hard at all  Food Insecurity: No Food Insecurity (06/29/2022)   Hunger Vital Sign    Worried About Running Out of Food in the Last Year: Never true    Ran Out of Food in the Last Year: Never true  Transportation Needs: No Transportation Needs (08/21/2022)   PRAPARE - Hydrologist (Medical): No    Lack of Transportation (Non-Medical): No  Physical Activity: Inactive (06/29/2022)   Exercise Vital Sign    Days of Exercise per Week: 0 days    Minutes of Exercise per Session: 0 min  Stress: No Stress Concern Present (06/29/2022)   [place]    Feeling of Stress : Not at all  Social Connections: Moderately Integrated (06/29/2022)   Social Connection and Isolation Panel [NHANES]    Frequency of Communication with Friends and Family: More than three times a week    Frequency of Social Gatherings with Friends and Family: More than three times a week    Attends Religious Services: More than 4 times per year    Active Member of [employer] or Organizations: Yes    Attends Archivist Meetings: More than 4 times per year    Marital Status: Divorced  Intimate Partner Violence: Not At Risk (06/29/2022)   Humiliation, Afraid, Rape, and Kick questionnaire    Fear of Current or Ex-Partner: No    Emotionally Abused: No    Physically Abused: No    Sexually Abused: No  Objective:  Physical Exam: BP 136/84 (BP Location: Left Arm, Patient Position: Sitting, Cuff Size: Large)   Pulse 69   Temp 97.6 F (36.4 C) (Temporal)   Wt 274 lb 12.8 oz (124.6 kg)   SpO2 98%   BMI 40.58 kg/m    ***General: No acute distress. Awake and conversant.  Eyes: Normal conjunctiva, anicteric. Round symmetric pupils.  ENT: Hearing grossly intact. No nasal discharge.  Neck: Neck is supple. No masses or thyromegaly.  Respiratory: Respirations are non-labored. No auditory wheezing.  Skin: Warm. No rashes or ulcers.  Psych: Alert and oriented. Cooperative, Appropriate mood and affect, Normal judgment.  CV: No cyanosis or JVD MSK: Normal ambulation. No clubbing  Neuro: Sensation and CN II-XII grossly normal.        Alesia Banda, MD, MS

## 2022-09-24 NOTE — Patient Instructions (Signed)
For COPD exacerbation,  Antibiotics: Augmentin and doxycycline Steroids: Prednisone Wheezing: nebulizer with duonebs as needed Cough: dextromethorphan-phenergan, may make you drowsy,  Referral to pulmonology  If severe chest pain, shortness of breath, go to ED

## 2022-09-25 NOTE — Assessment & Plan Note (Signed)
Symptoms most consistent with COPD exacerbation, seems less likely to be heart failure but cannot rule out heart failure, Will treat broad-spectrum antibiotics, steroids, nebulizer with DuoNebs Patient with multiple comorbidities and increased risk for hospitalization and mortality, referral placed to pulmonology Strict return and ED precautions

## 2022-09-26 ENCOUNTER — Encounter: Payer: Self-pay | Admitting: Internal Medicine

## 2022-09-26 ENCOUNTER — Telehealth (INDEPENDENT_AMBULATORY_CARE_PROVIDER_SITE_OTHER): Payer: Medicare HMO | Admitting: Internal Medicine

## 2022-09-26 DIAGNOSIS — L304 Erythema intertrigo: Secondary | ICD-10-CM | POA: Diagnosis not present

## 2022-09-26 MED ORDER — KETOCONAZOLE 2 % EX CREA: 1.0000 | TOPICAL_CREAM | Freq: Every day | CUTANEOUS | 1 refills | Status: DC

## 2022-09-26 MED ORDER — FLUCONAZOLE 100 MG PO TABS: ORAL_TABLET | ORAL | 1 refills | Status: DC

## 2022-09-26 NOTE — Progress Notes (Signed)
Virtual Visit via Telephone Note  I connected with Hal Hope on 11/08/20 at  9:30 AM EST by telephone and verified that I am speaking with the correct person using two identifiers.  Location:  Persons participating in the virtual visit: patient, provider Patient: home Provider: [place] Office   I discussed the limitations, risks, security and privacy concerns of performing an evaluation and management service by telephone and the availability of in person appointments. I also discussed with the patient that there may be a patient responsible charge related to this service. The patient expressed understanding and agreed to proceed.  No chief complaint on file.    History of Present Illness:  C/o fungal infection relapsed bad under breasts and in the groin - it hurts. Pt had it in the past   Review of Systems  Constitutional:  Negative for fever.     Observations/Objective:   Assessment and Plan:  Problem List Items Addressed This Visit     Intertrigo - Primary    Recurrent - fungal infection relapsed bad under breasts and in the groin Ketoconazole cream and Diflucan po        Meds ordered this encounter  Medications   ketoconazole (NIZORAL) 2 % cream    Sig: Apply 1 Application topically daily.    Dispense:  120 g    Refill:  1   fluconazole (DIFLUCAN) 100 MG tablet    Sig: Take 2 tabs on day#1, then 1 tab daily on Days #2-10    Dispense:  11 tablet    Refill:  1      Follow Up Instructions:    I discussed the assessment and treatment plan with the patient. The patient was provided an opportunity to ask questions and all were answered. The patient agreed with the plan and demonstrated an understanding of the instructions.   The patient was advised to call back or seek an in-person evaluation if the symptoms worsen or if the condition fails to improve as anticipated.  I provided 15 minutes of non-face-to-face time during this encounter.   Walker Kehr,  MD

## 2022-09-26 NOTE — Assessment & Plan Note (Signed)
Recurrent - fungal infection relapsed bad under breasts and in the groin Ketoconazole cream and Diflucan po

## 2022-09-27 ENCOUNTER — Other Ambulatory Visit (HOSPITAL_COMMUNITY): Payer: Self-pay | Admitting: Cardiology

## 2022-09-30 ENCOUNTER — Encounter (HOSPITAL_BASED_OUTPATIENT_CLINIC_OR_DEPARTMENT_OTHER): Payer: Self-pay | Admitting: Pulmonary Disease

## 2022-10-01 ENCOUNTER — Ambulatory Visit (INDEPENDENT_AMBULATORY_CARE_PROVIDER_SITE_OTHER): Payer: Medicare HMO

## 2022-10-01 ENCOUNTER — Ambulatory Visit: Payer: Medicare HMO | Admitting: Podiatry

## 2022-10-01 DIAGNOSIS — I5022 Chronic systolic (congestive) heart failure: Secondary | ICD-10-CM

## 2022-10-01 DIAGNOSIS — M79674 Pain in right toe(s): Secondary | ICD-10-CM | POA: Diagnosis not present

## 2022-10-01 DIAGNOSIS — B351 Tinea unguium: Secondary | ICD-10-CM | POA: Diagnosis not present

## 2022-10-01 DIAGNOSIS — Z95810 Presence of automatic (implantable) cardiac defibrillator: Secondary | ICD-10-CM | POA: Diagnosis not present

## 2022-10-01 DIAGNOSIS — M79675 Pain in left toe(s): Secondary | ICD-10-CM | POA: Diagnosis not present

## 2022-10-02 ENCOUNTER — Encounter: Payer: Self-pay | Admitting: Podiatry

## 2022-10-02 ENCOUNTER — Ambulatory Visit: Payer: Self-pay | Admitting: Licensed Clinical Social Worker

## 2022-10-02 ENCOUNTER — Ambulatory Visit: Payer: Medicare HMO | Admitting: Psychology

## 2022-10-02 NOTE — Patient Outreach (Signed)
  Care Coordination   Follow Up Visit Note   10/02/2022 Name: [name] MRN: [id number] DOB: 1956/01/22  [name] is a 66 y.o. year old male who sees [name], MD for primary care. I spoke with  [name] by phone today.  What matters to the patients health and wellness today?   Patient is making progress with managing symptoms of depression and has decided not to move forward with therapy.  He has also secured housing which was on of his biggest stressors.    Goals Addressed             This Visit's Progress    COMPLETED: Care Coordination Activities       Care Coordination Interventions: Solution-Focused Strategies employed:  Active listening / Reflection utilized   Housing  (Has located housing)  Discussed barriers with connecting for therapy          SDOH assessments and interventions completed:  Yes  SDOH Interventions Today    Flowsheet Row Most Recent Value  SDOH Interventions   Housing Interventions Intervention Not Indicated       Care Coordination Interventions:  Yes, provided   Follow up plan: No further intervention required. Will call if needed  Encounter Outcome:  Pt. Visit Completed   Casimer Lanius, [place] [phone]

## 2022-10-02 NOTE — Progress Notes (Signed)
EPIC Encounter for ICM Monitoring  Patient Name: [name] is a 66 y.o. male Date: 10/02/2022 Primary Care Physican: [name], MD Primary Cardiologist: [name] Electrophysiologist: Vergie Living Pacing: 99.6%    10/02/2022 Weight: 265 lbs     Attempted call to patient and unable to reach.  Left detailed message per DPR regarding transmission. Transmission reviewed.   He's had a COPD flare up for the past month   Optivol thoracic impedance suggesting possible fluid accumulation starting 12/15.   Prescribed:  Torsemide 20 mg to 2 tablets (40 mg total) by mouth twice a day.   Potassium 20 mEq take 1 tablets (20 mEq total) daily   Labs: 03/30/2022 Creatinine 1.37, BUN 29, Potassium 44, Sodium 140 03/20/2022 Creatinine 1.51, BUN 36, Potassium 4.5, Sodium 135  12/20/2021 Creatinine 1.00, BUN 27, Potassium 4.3, Sodium 140, GFR 78.15 12/15/2021 Creatinine 1.05, BUN 23, Potassium 4.5, Sodium 137, GFR >60 A complete set of results can be found in Results Review.   Recommendations:  Pt will be given recommendations at 12/22 HF clinic.     Follow-up plan: ICM clinic phone appointment on 10/19/2022 to recheck fluid levels.   91 day device clinic remote transmission 10/24/2022.   EP/Cardiology Office Visits:   10/05/2022 with HF clinic.  Recall 10/28/2022 with Dr [name].      Copy of ICM check sent to Dr. [name].       3 month ICM trend: 10/02/2022.    12-14 Month ICM trend:     [name], RN 10/02/2022 4:06 PM

## 2022-10-02 NOTE — Patient Instructions (Signed)
Visit Information  Thank you for taking time to visit with me today. Please don't hesitate to contact me if I can be of assistance to you.   Following are the goals we discussed today:   Goals Addressed             This Visit's Progress    COMPLETED: Care Coordination Activities       Care Coordination Interventions: Solution-Focused Strategies employed:  Active listening / Reflection utilized   Housing  (Has located housing)  Discussed barriers with connecting for therapy         Please call the care guide team at [phone] if you need to cancel or reschedule your appointment.   If you are experiencing a Mental Health or [place] or need someone to talk to, please call the Suicide and Crisis Lifeline: 988 call the [place] National Suicide Prevention Lifeline: (640)118-9835 or TTY: 8320462359 TTY 615-520-3760) to talk to a trained counselor call 1-800-273-TALK (toll free, 24 hour hotline)   Patient verbalizes understanding of instructions and care plan provided today and agrees to view in [place]. Active MyChart status and patient understanding of how to access instructions and care plan via MyChart confirmed with patient.     No further follow up required: By [place], [place] [phone]

## 2022-10-02 NOTE — Progress Notes (Signed)
  Subjective:  Patient ID: [name], male    DOB: Aug 10, 1956,  MRN: [id number]  Chief Complaint  Patient presents with   Nail Problem    Thick painful toenails, 9 week follow up     65 y.o. male returns with the above complaint. History confirmed with patient.  His nails are thickened elongated and causing pain again.  Calluses doing better  Objective:  Physical Exam: warm, good capillary refill, no trophic changes or ulcerative lesions, normal sensory exam, +2 DP and PT reduced bilateral and venous stasis dermatitis noted.  Moderate to severe hallux abductovalgus with hammertoe contractures 2 through 5 bilaterally, right worse than left.  Thickened elongated dystrophic mycotic nails x10 with pincer nail deformity.     Summary:  Right: Resting right ankle-brachial index indicates noncompressible right  lower extremity arteries. The right toe-brachial index is normal. ABIs are  unreliable.   Left: Resting left ankle-brachial index is within normal range. No  evidence of significant left lower extremity arterial disease. The left  toe-brachial index is normal.       *See table(s) above for measurements and observations.         Radiographs: X-ray of the right foot: Moderate hallux abductovalgus with the presence of metatarsus adductus deformity and pes planus, no increased change in alignment since last radiographs Assessment:   1. Pain due to onychomycosis of toenails of both feet        Plan:  Patient was evaluated and treated and all questions answered.  Discussed the etiology and treatment options for the condition in detail with the patient. Educated patient on the topical and oral treatment options for mycotic nails. Recommended debridement of the nails today. Sharp and mechanical debridement performed of all painful and mycotic nails today. Nails debrided in length and thickness using a nail nipper to level of comfort. Discussed treatment options including  appropriate shoe gear. Follow up as needed for painful nails.  Ingrowing corners of hallux nails debrided and slant back fashion   Return in about 12 weeks (around 12/24/2022) for at risk diabetic foot care.

## 2022-10-03 ENCOUNTER — Encounter: Payer: Medicare HMO | Admitting: Licensed Clinical Social Worker

## 2022-10-03 NOTE — Progress Notes (Signed)
ID:  [name], DOB 1956/08/09, MRN [id number]  Provider location: [facility] Type of Visit: Established patient  PCP:  [name], MD  Cardiologist:  Dr. [name]   History of Present Illness: [name] is a 66 y.o. male who has history of COPD on home oxygen, permanent atrial fibrillation, and chronic systolic CHF.  Amiodarone and DCCV were tried for atrial fibrillation in the past without success.   Patient has been noted to have a low EF since 2013.  EF improved by to normal by 2015 but dropped to 30-35% by 11/16.  Cath in 6/17 showed some CAD but not enough to cause cardiomyopathy.  He was admitted in 3/18 with PNA, atrial fibrillation/RVR, and acute on chronic systolic CHF.  Echo in 3/18 showed fall in EF to 10-15%.   I took him for right and left heart cath in 4/18, which showed nonobstructive CAD and relatively optimized filling pressures.    Syncope in 11/18. Loop recorder showed 6 second pause and periods of complete heart block. He had Medtronic BiV ICD placed in 12/18.  In 1/19, he had AV nodal ablation to promote BiV pacing.   Echo was done 6/19 with EF 25-30% with moderate RV dilation/mildly decreased systolic function. CPX 7/19 was submaximal, probably mild functional impairment.    Echo in 1/21 showed EF 35-40% with diffuse hypokinesis, moderate RV dilation with mildly decreased systolic function.   He was admitted with COVID-19 PNA in 2/19.  He had AKI/hypotension and Entresto was stopped.   Echo in 3/22 showed EF 40% with diffuse hypokinesis, mild RV enlargement, mildly decreased RV systolic function.   Presented to [facility] 7/22 with weakness and dizziness. Found to be in hemorrhagic shock from acute GIB. Hgb 5.0, INR 8.4. He was on Coumadin for atrial fibrillation. He was resuscitated with PRBCs, IVF, and given Vitamin K. His Entresto, beta blocker and torsemide were held due to hypotension. He initially required ICU monitoring due to  hypotension requiring pressors. GI team was consulted and planned to treat with oral antibiotics for suspected diverticular bleed with plans for outpatient colonoscopy.  Coumadin was transitioned to Eliquis.  He was discharged back on beta blocker, Entresto and SGLT2i. Dig, spiro and torsemide were held at discharge. Weight 259 lbs.  S/p TKR 1/23  Today he returns for HF follow up. Overall feeling fair. Had recent COPD flare and has chest soreness with coughing. He remains short of breath walking short distances on flat ground. Legs are swelling, blister to left lower leg popped. Denies palpitations, ischemic CP, dizziness, or PND/Orthopnea. Chronically sleeps reclined. Appetite ok. No fever or chills. Weight at home 265 pounds. Taking all medications. Will be moving to [place] in the new year.  ECG (personally reviewed): V-paced, 70 bpm  MDT device interrogation: BiV pacing 99.6%, no VT/AF,  OptiVol up, 0.5 hr/day activity.   Labs (4/18): pro-BNP 904, K 3.1, creatinine 1.39 => 1.08 with BUN 66 => 37, hgb 14.1.  Labs (5/18): hemoglobin 13.1 Labs (6/18): K 4, creatinine 1.3, BNP 294 Labs (7/18): K 3.8, creatinine 1.23, LDL 96, HDL 42 Labs (8/18): K 4.5, creatinine 1.22 Labs (10/18): digoxin 0.4 Labs (12/18): LDL 115, HDL 37, TGs 214, K 4.2, creatinine 0.84 Labs (1/19): K 4.4, creatinine 0.75, pro-BNP 433 Labs (2/19): digoxin 0.4 Labs (3/19): LDL 121, hgb 12.8, K 4.5, creatinine 0.98 Labs (6/19): LDL 48, HDL 45, K 5, creatinine 0.94, digoxin 0.3 Labs (9/19): K 3.7, creatinine 1.09 => 1.32,  digoxin 0.2 Labs (11/19): LDL 115 Labs (12/20): K 3.9, creatinine 0.87, LDL 94  Labs (3/21): K 4, creatinine 1.08 Labs (6/21): LDL 109 Labs (7/21): K 3.7, creatinine 1.15 Labs (12/21): LDL 129, TGs 239, hgb 12.9, K 3.9, creatinine 0.98, digoxin 0.8 Labs (1/22): ALT 76, AST 40, LDL 162, K 3.8 =>4.6, creatinine 1.16 => 1.12, digoxin 0.7, repeat LFTs normal Labs (5/22): LDL 72, TGs 335, K 4.2, creatinine  1.29 Labs (7/22): K 4.0, creatinine 0.77 Labs (8/22): K 4.0, creatinine 1.11 Labs (9/22): K 3.7, creatinine 1.14, hgb 11.7 Labs (3/23): LDL 28, TGs 132, LFTs normal, K 4.3, creatinine 1.0 Labs (6/23): K 4.4, creatinine 1.37  PMH: 1. Asthma 2. COPD: On home oxygen. Prior smoker.  3. Atrial fibrillation: Permanent.  He failed DCCV and amiodarone in the past . 4. HTN 5. GERD 6. Hyperlipidemia: Myalgias with atorvastatin 7. BPH.  8. Chronic systolic CHF: Nonischemic cardiomyopathy.  - EF 25% by TEE in 7/13.  Normalized on 2015 echo. - Echo (11/16): EF 30-35%.  - LHC/RHC (6/17): 80% ostial stenosis small D1, 30-40% pLAD.  Mean RA 4, PA 26/10, mean PCWP 11, CI 2.33.  - Echo (3/18): EF 10-15%, diffuse hypokinesis, severe LV dilation, moderate RV dilation with severely decreased systolic function.  - LHC/RHC (4/18): 40% proximal LAD.  Mean RA 8, PA 37/10 mean 22, mean PCWP 22, LVEDP 9, CI 2.39. - Medtronic BiV ICD placement in 12/18 with AV nodal ablation to promote BiV pacing in 1/19.  - Echo (6/19): EF 25-30% with mild LV dilation, moderately dilated RV with mildly decreased systolic function, IVC not dilated, PASP 23 mmHg.  - CPX (7/19): peak VO2 17.6 (78% predicted), VE/VCO2 slope 31, RER 1.02 => submaximal, probably mild functional impairment.  - Echo (1/21): EF 35-40% with diffuse hypokinesis, moderate RV dilation with mildly decreased systolic function. - Echo (3/22): EF 40% with diffuse hypokinesis, mild RV enlargement, mildly decreased RV systolic function.  9. Morbid obesity 10. ILR in place.  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 Complete heart block: s/p Medtronic CRT-D.  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 OSA: Untreated, unable to tolerate CPAP.  15. COVID-19 PNA 2/21.  16. Hyperlipidemia: Myalgias with multiple statins.  17. PAD: ABIs 10/21 noncompressible on right but TBI normal, normal ABI and TBI on left.  18. GI bleeding: 7/22, diverticulosis.  19. Left TKR  (1/23)  Social History   Socioeconomic History   Marital status: Divorced    Spouse name: Not on file   Number of children: 2   Years of education: Not on file   Highest education level: Not on file  Occupational History   Occupation: retired/disabled. prev worked in Therapist, sports.    Employer: DISABLED  Tobacco Use   Smoking status: Former    Packs/day: 2.00    Years: 30.00    Total pack years: 60.00    Types: Cigarettes    Quit date: 10/16/2007    Years since quitting: 14.9   Smokeless tobacco: Never  Vaping Use   Vaping Use: Never used  Substance and Sexual Activity   Alcohol use: No   Drug use: No   Sexual activity: Not Currently  Other Topics Concern   Not on file  Social History Narrative   Lives alone.   Social Determinants of Health   Financial Resource Strain: Low Risk  (06/29/2022)   Overall Financial Resource Strain (CARDIA)    Difficulty of Paying Living Expenses: Not hard at all  Food Insecurity: No Food Insecurity (06/29/2022)  Hunger Vital Sign    Worried About Running Out of Food in the Last Year: Never true    Ran Out of Food in the Last Year: Never true  Transportation Needs: No Transportation Needs (08/21/2022)   PRAPARE - Hydrologist (Medical): No    Lack of Transportation (Non-Medical): No  Physical Activity: Inactive (06/29/2022)   Exercise Vital Sign    Days of Exercise per Week: 0 days    Minutes of Exercise per Session: 0 min  Stress: No Stress Concern Present (06/29/2022)   [place]    Feeling of Stress : Not at all  Social Connections: Moderately Integrated (06/29/2022)   Social Connection and Isolation Panel [NHANES]    Frequency of Communication with Friends and Family: More than three times a week    Frequency of Social Gatherings with Friends and Family: More than three times a week    Attends Religious Services: More than 4 times per  year    Active Member of [employer] or Organizations: Yes    Attends Music therapist: More than 4 times per year    Marital Status: Divorced  Intimate Partner Violence: Not At Risk (06/29/2022)   Humiliation, Afraid, Rape, and Kick questionnaire    Fear of Current or Ex-Partner: No    Emotionally Abused: No    Physically Abused: No    Sexually Abused: No   Family History  Problem Relation Age of Onset   COPD Mother    Asthma Mother    Colon polyps Mother    Allergies Mother    Hypothyroidism Mother    Asthma Maternal Grandmother    Colon cancer Neg Hx    ROS: All systems reviewed and negative except as per HPI.   Current Outpatient Medications  Medication Sig Dispense Refill   acetaminophen (TYLENOL) 500 MG tablet Take 500 mg by mouth every 6 (six) hours as needed for moderate pain.     albuterol (VENTOLIN HFA) 108 (90 Base) MCG/ACT inhaler INHALE 2 PUFFS BY MOUTH 4 TIMES A DAY AS NEEDED 54 each 3   ALPRAZolam (XANAX) 0.5 MG tablet Take 2 tablets (1 mg total) by mouth at bedtime. 60 tablet 5   apixaban (ELIQUIS) 5 MG TABS tablet Take 1 tablet (5 mg total) by mouth 2 (two) times daily. 60 tablet 11   augmented betamethasone dipropionate (DIPROLENE-AF) 0.05 % cream Apply 1 application topically daily as needed (itching).     carisoprodol (SOMA) 350 MG tablet TAKE 1 TABLET BY MOUTH THREE TIMES A DAY AS NEEDED FOR MUSCLE SPASM 90 tablet 2   carvedilol (COREG) 6.25 MG tablet TAKE 1 TABLET BY MOUTH TWICE A DAY 180 tablet 3   Cholecalciferol 100 MCG (4000 UT) CAPS 1 tab by mouth once daily 30 capsule 99   dapagliflozin propanediol (FARXIGA) 10 MG TABS tablet Take 1 tablet (10 mg total) by mouth daily before breakfast. 90 tablet 3   doxepin (SINEQUAN) 10 MG capsule TAKE 2 CAPSULES (20 MG TOTAL) BY MOUTH AT BEDTIME AS NEEDED. 180 capsule 1   Dupilumab (DUPIXENT) 300 MG/2ML SOPN Inject 300 mg into the skin every 14 (fourteen) days.     eplerenone (INSPRA) 25 MG tablet TAKE 1 TABLET  (25 MG TOTAL) BY MOUTH DAILY. 90 tablet 3   eplerenone (INSPRA) 50 MG tablet Take 1 tablet (50 mg total) by mouth daily. 90 tablet 3   Evolocumab (REPATHA SURECLICK) 657 MG/ML SOAJ  INJECT 1 PEN INTO THE SKIN EVERY 14 (FOURTEEN) DAYS. 6 mL 3   fluticasone (FLONASE) 50 MCG/ACT nasal spray PLACE 2 SPRAYS INTO BOTH NOSTRILS DAILY AS NEEDED FOR ALLERGIES OR RHINITIS. 48 mL 1   Fluticasone-Umeclidin-Vilant (TRELEGY ELLIPTA) 100-62.5-25 MCG/ACT AEPB Inhale 1 puff into the lungs daily. 180 each 2   hydrOXYzine (ATARAX/VISTARIL) 10 MG tablet TAKE 1 TABLET BY MOUTH 3 TIMES DAILY AS NEEDED FOR ITCHING. 60 tablet 2   ipratropium-albuterol (DUONEB) 0.5-2.5 (3) MG/3ML SOLN Take 3 mLs by nebulization every 4 (four) hours as needed (wheezing). 360 mL 0   ketoconazole (NIZORAL) 2 % cream Apply 1 Application topically daily. 120 g 1   KLOR-CON M20 20 MEQ tablet TAKE 1 TABLET BY MOUTH EVERY DAY 90 tablet 3   lidocaine (LIDODERM) 5 % APPLY 1 PATCH TO AFFECTED AREA FOR UP TO 12 HOURS. DO NOT USE MORE THAN ONE PATCH IN 24 HOURS. 90 patch 2   losartan (COZAAR) 25 MG tablet Take 1 tablet (25 mg total) by mouth daily. 30 tablet 11   metolazone (ZAROXOLYN) 2.5 MG tablet Take 1 tablet (2.5 mg total) by mouth as directed. With additional 40 meq of potassium 5 tablet 0   omeprazole (PRILOSEC) 20 MG capsule TAKE 1 CAPSULE BY MOUTH TWICE A DAY 180 capsule 1   Potassium Chloride (KLOR-CON PO) Take 20 mcg by mouth daily.     Respiratory Therapy Supplies (FULL KIT NEBULIZER SET) MISC 1 each by Does not apply route as needed. 1 each 0   silver sulfADIAZINE (SILVADENE) 1 % cream APPLY TOPICALLY TO AFFECTED AREA EVERY DAY 50 g 3   tamsulosin (FLOMAX) 0.4 MG CAPS capsule TAKE 1 CAPSULE BY MOUTH EVERY DAY 90 capsule 3   torsemide (DEMADEX) 20 MG tablet TAKE 3 TABLETS (60 MG TOTAL) BY MOUTH 2 (TWO) TIMES DAILY. 540 tablet 1   traMADol (ULTRAM) 50 MG tablet TAKE 1 TABLET BY MOUTH EVERY DAY AS NEEDED 30 tablet 2   traZODone (DESYREL) 100  MG tablet TAKE 2 TABLETS BY MOUTH AT BEDTIME AS NEEDED 180 tablet 1   TUMS 500 MG chewable tablet Chew 500-2,000 mg by mouth every 4 (four) hours as needed for indigestion or heartburn.      venlafaxine XR (EFFEXOR XR) 150 MG 24 hr capsule Take 1 capsule (150 mg total) by mouth daily with breakfast. 90 capsule 3   zolpidem (AMBIEN) 10 MG tablet TAKE 1 TABLET BY MOUTH EVERY DAY AT BEDTIME AS NEEDED 90 tablet 1   No current facility-administered medications for this encounter.   Wt Readings from Last 3 Encounters:  10/05/22 127 kg (280 lb)  10/04/22 126.8 kg (279 lb 6.9 oz)  09/24/22 124.6 kg (274 lb 12.8 oz)   BP 100/60   Pulse 70   Wt 127 kg (280 lb)   SpO2 97%   BMI 41.35 kg/m  Physical Exam General:  NAD. No resp difficulty, walked into clinic with a cane HEENT: Normal Neck: Supple. JVP 8-9. Carotids 2+ bilat; no bruits. No lymphadenopathy or thryomegaly appreciated. Cor: PMI nondisplaced. Regular rate & rhythm. No rubs, gallops or murmurs. Lungs: Clear Abdomen: Obese, soft, nontender, nondistended. No hepatosplenomegaly. No bruits or masses. Good bowel sounds. Extremities: No cyanosis, clubbing, rash, 1-2+ BLE edema to knees, R>L; ruptured serous blister to left lateral lower leg, mild serous drainage with erythema Neuro: Alert & oriented x 3, cranial nerves grossly intact. Moves all 4 extremities w/o difficulty. Affect pleasant.  Assessment/Plan:  1. Atrial fibrillation: Permanent.  Now s/p AV nodal ablation to allow effective BiV pacing.  - Continue Eliquis. No bleeding issues. CBC today.   2. COPD: No longer smoking.  Not using oxygen.  3. Chronic systolic CHF: Nonischemic cardiomyopathy based on 6/17 cath. However, he had a recent significant fall in EF from 30-35% to 10-15% on echo from 3/18.  LHC/RHC in 4/18 showed nonobstructive CAD and relatively optimized filling pressures.  He now has Medtronic CRT-D device with AV nodal ablation to allow BiV pacing.  Echo 6/19 with EF  25-30% with mildly decreased RV systolic function. CPX 7/19 was submaximal but likely only mild functional limitation from CHF.  Echo in 1/21 showed EF 35-40%, echo in 3/22 showed EF up to 40% with mildly decreased RV systolic function.  NYHA class II-early III, functional class confounded by body habitus and physical deconditioning. He is volume overloaded on exam and by OptiVol - Increase torsemide to 80 mg bid + increase KCL to 20 bid. BMET/BNP today, repeat BMET in 10 days. - Continue Coreg 6.25 mg bid. - Continue torsemide 60 mg bid.   - Continue eplerenone 50 mg daily (off spiro with gynecomastia).   - Continue losartan 25 mg daily (did not tolerate low dose Entresto with dizziness and low BPs).   - Continue Farxiga 10 mg daily. - Repeat echo. - I will ask Sharman Cheek to send device interrogation in 1 week to follow fluid response. 4. CAD: Nonobstructive on 4/18 cath.  No exertional chest pain.  - No ASA with Eliquis use. - Statin myalgias, continue Repatha. Good LDL in 3/23.  5. Complete heart block: Now has Medtronic CRT-D device and s/p AV nodal ablation.  6. OSA: Unable to tolerate CPAP.   7. Elevated LFTs: Most recent LFTs back to normal.   8. Lower extremity wound: Suspect venous stasis and volume overload contributory. Had a recent blister that has now popped. He is on doxy for recent COPD flare. I placed a Mepilex to area today to prevent shearing against pants. - Will obtain ABIs and refer to Wound Clinic.   Follow up in 4 months with Dr. [name].  Signed, Rafael Bihari, FNP  10/05/2022  [facility] [address] [facility] [place] [address] [phone] (office) [phone] (fax)

## 2022-10-04 ENCOUNTER — Ambulatory Visit (INDEPENDENT_AMBULATORY_CARE_PROVIDER_SITE_OTHER): Payer: Medicare HMO | Admitting: Pulmonary Disease

## 2022-10-04 ENCOUNTER — Encounter (HOSPITAL_BASED_OUTPATIENT_CLINIC_OR_DEPARTMENT_OTHER): Payer: Self-pay | Admitting: Pulmonary Disease

## 2022-10-04 VITALS — BP 118/78 | HR 76 | Ht 69.0 in | Wt 279.4 lb

## 2022-10-04 DIAGNOSIS — J449 Chronic obstructive pulmonary disease, unspecified: Secondary | ICD-10-CM

## 2022-10-04 DIAGNOSIS — J4489 Other specified chronic obstructive pulmonary disease: Secondary | ICD-10-CM

## 2022-10-04 MED ORDER — TRELEGY ELLIPTA 100-62.5-25 MCG/ACT IN AEPB: 1.0000 | INHALATION_SPRAY | Freq: Every day | RESPIRATORY_TRACT | 2 refills | Status: DC

## 2022-10-04 NOTE — Progress Notes (Signed)
Subjective:   PATIENT ID: [name] GENDER: male DOB: 1956-03-25, MRN: [id number]  Chief Complaint  Patient presents with   Consult    copd    Reason for Visit: New consult for COPD  [name] is a 66 year old male former smoker with COPD, childhood asthma, OSA not on CPAP, DM2, HTN, atrial fib on eliquis, NICM s/p AICD, chronic diastolic heart failure who presents for shortness of breath.  He was recently treated for COPD exacerbation on 09/24/22. Treated with augmentin and doxycycline. On duonebs and albuterol. Referred to Pulmonary. Cough and wheezing has improved. He reports shortness of breath with walking a few steps. Denies chronic cough. Has baseline wheezing. Albuterol usually controls his symptoms. Currently on Dupixent which is managed by his dermatologist. Has been on it for two years and feels this helps his breathing. Before the pandemic [facility] [employer] and [facility].   Social History: Quit in 2009. Former smoker 60 pack -years   I have personally reviewed patient's past medical/family/social history, allergies, current medications.  Past Medical History:  Diagnosis Date   AICD (automatic cardioverter/defibrillator) present    Anemia    supposed to be taking Vit B but doesn't   ANXIETY    takes Xanax nightly   Arthritis    Asthma    Albuterol prn and Advair daily;also takes Prednisone daily   Atrial fibrillation ([place]) 09/06/2015   Cardiomyopathy ([place])    a. EF 25% TEE July 2013; b. EF normalized 2015;  c. 03/2015 Echo: EF 40-45%, difrf HK, PASP 38 mmHg, Mild MR, sev LAE/RAE.   CHF (congestive heart failure) (HCC)    Chronic constipation    takes OTC stool softener   COPD (chronic obstructive pulmonary disease) (HCC)    "one dr says COPD; one dr says emphysema" (09/18/2017)   DEPRESSION    takes Zoloft and Doxepin daily   Diverticulitis    DYSKINESIA, ESOPHAGUS    Dysrhythmia    Atrial Fibrilation   Emphysema of lung  [facility])    "one dr says COPD; one dr says emphysema" (09/18/2017)   Essential hypertension        FIBROMYALGIA    GERD (gastroesophageal reflux disease)        Glaucoma    HYPERLIPIDEMIA    a. Intolerant to statins.   INSOMNIA    takes Ambien nightly   Myocardial infarction [facility])    a. 2012 Myoview notable for prior infarct;  b. 03/2015 Lexiscan CL: EF 37%, diff HK, small area of inferior infarct from apex to base-->Med Rx.   Myocardial infarction ([place])    O2 dependent    "2.5L q hs & prn" (09/18/2017)   Paroxysmal atrial fibrillation ([place])    a. CHA2DS2VASc = 3--> takes Coumadin;  b. 03/15/2015 Successful TEE/DCCV;  c. 03/2015 recurrent afib, Amio d/c'd in setting of hyperthyroidism.   Peripheral neuropathy    Pneumonia 12/2016   Rash and other nonspecific skin eruption 04/12/2009   no cause found saw dermatologists x 2 and allergist   SLEEP APNEA, OBSTRUCTIVE    a. doesn't use CPAP   Syncope    a. 03/2015 s/p MDT LINQ.   Type II diabetes mellitus (HCC)          Family History  Problem Relation Age of Onset   COPD Mother    Asthma Mother    Colon polyps Mother    Allergies Mother    Hypothyroidism Mother  Asthma Maternal Grandmother    Colon cancer Neg Hx      Social History   Occupational History   Occupation: retired/disabled. prev worked in Therapist, sports.    Employer: DISABLED  Tobacco Use   Smoking status: Former    Packs/day: 2.00    Years: 30.00    Total pack years: 60.00    Types: Cigarettes    Quit date: 10/16/2007    Years since quitting: 14.9   Smokeless tobacco: Never  Vaping Use   Vaping Use: Never used  Substance and Sexual Activity   Alcohol use: No   Drug use: No   Sexual activity: Not Currently    Allergies  Allergen Reactions   Amiodarone Other (See Comments)    hyperthyroidism   Statins Other (See Comments)    myalgia   Tape Other (See Comments)    Skin Tears Use Paper Tape Only     Outpatient Medications Prior to Visit   Medication Sig Dispense Refill   acetaminophen (TYLENOL) 500 MG tablet Take 500 mg by mouth every 6 (six) hours as needed for moderate pain.     albuterol (VENTOLIN HFA) 108 (90 Base) MCG/ACT inhaler INHALE 2 PUFFS BY MOUTH 4 TIMES A DAY AS NEEDED 54 each 3   ALPRAZolam (XANAX) 0.5 MG tablet Take 2 tablets (1 mg total) by mouth at bedtime. 60 tablet 5   apixaban (ELIQUIS) 5 MG TABS tablet Take 1 tablet (5 mg total) by mouth 2 (two) times daily. 60 tablet 11   augmented betamethasone dipropionate (DIPROLENE-AF) 0.05 % cream Apply 1 application topically daily as needed (itching).     carisoprodol (SOMA) 350 MG tablet TAKE 1 TABLET BY MOUTH THREE TIMES A DAY AS NEEDED FOR MUSCLE SPASM 90 tablet 2   carvedilol (COREG) 6.25 MG tablet TAKE 1 TABLET BY MOUTH TWICE A DAY 180 tablet 3   Cholecalciferol 100 MCG (4000 UT) CAPS 1 tab by mouth once daily 30 capsule 99   dapagliflozin propanediol (FARXIGA) 10 MG TABS tablet Take 1 tablet (10 mg total) by mouth daily before breakfast. 90 tablet 3   doxepin (SINEQUAN) 10 MG capsule TAKE 2 CAPSULES (20 MG TOTAL) BY MOUTH AT BEDTIME AS NEEDED. 180 capsule 1   Dupilumab (DUPIXENT) 300 MG/2ML SOPN Inject 300 mg into the skin every 14 (fourteen) days.     eplerenone (INSPRA) 25 MG tablet TAKE 1 TABLET (25 MG TOTAL) BY MOUTH DAILY. 90 tablet 3   eplerenone (INSPRA) 50 MG tablet Take 1 tablet (50 mg total) by mouth daily. 90 tablet 3   Evolocumab (REPATHA SURECLICK) 003 MG/ML SOAJ INJECT 1 PEN INTO THE SKIN EVERY 14 (FOURTEEN) DAYS. 6 mL 3   fluticasone (FLONASE) 50 MCG/ACT nasal spray PLACE 2 SPRAYS INTO BOTH NOSTRILS DAILY AS NEEDED FOR ALLERGIES OR RHINITIS. 48 mL 1   hydrOXYzine (ATARAX/VISTARIL) 10 MG tablet TAKE 1 TABLET BY MOUTH 3 TIMES DAILY AS NEEDED FOR ITCHING. 60 tablet 2   ipratropium-albuterol (DUONEB) 0.5-2.5 (3) MG/3ML SOLN Take 3 mLs by nebulization every 4 (four) hours as needed (wheezing). 360 mL 0   ketoconazole (NIZORAL) 2 % cream Apply 1  Application topically daily. 120 g 1   KLOR-CON M20 20 MEQ tablet TAKE 1 TABLET BY MOUTH EVERY DAY 90 tablet 3   lidocaine (LIDODERM) 5 % APPLY 1 PATCH TO AFFECTED AREA FOR UP TO 12 HOURS. DO NOT USE MORE THAN ONE PATCH IN 24 HOURS. 90 patch 2   losartan (COZAAR) 25 MG tablet  Take 1 tablet (25 mg total) by mouth daily. 30 tablet 11   metolazone (ZAROXOLYN) 2.5 MG tablet Take 1 tablet (2.5 mg total) by mouth as directed. With additional 40 meq of potassium 5 tablet 0   omeprazole (PRILOSEC) 20 MG capsule TAKE 1 CAPSULE BY MOUTH TWICE A DAY 180 capsule 1   Potassium Chloride (KLOR-CON PO) Take 20 mcg by mouth daily.     Respiratory Therapy Supplies (FULL KIT NEBULIZER SET) MISC 1 each by Does not apply route as needed. 1 each 0   silver sulfADIAZINE (SILVADENE) 1 % cream APPLY TOPICALLY TO AFFECTED AREA EVERY DAY 50 g 3   tamsulosin (FLOMAX) 0.4 MG CAPS capsule TAKE 1 CAPSULE BY MOUTH EVERY DAY 90 capsule 3   torsemide (DEMADEX) 20 MG tablet TAKE 3 TABLETS (60 MG TOTAL) BY MOUTH 2 (TWO) TIMES DAILY. 540 tablet 1   traMADol (ULTRAM) 50 MG tablet TAKE 1 TABLET BY MOUTH EVERY DAY AS NEEDED 30 tablet 2   traZODone (DESYREL) 100 MG tablet TAKE 2 TABLETS BY MOUTH AT BEDTIME AS NEEDED 180 tablet 1   TUMS 500 MG chewable tablet Chew 500-2,000 mg by mouth every 4 (four) hours as needed for indigestion or heartburn.      venlafaxine XR (EFFEXOR XR) 150 MG 24 hr capsule Take 1 capsule (150 mg total) by mouth daily with breakfast. 90 capsule 3   zolpidem (AMBIEN) 10 MG tablet TAKE 1 TABLET BY MOUTH EVERY DAY AT BEDTIME AS NEEDED 90 tablet 1   fluconazole (DIFLUCAN) 100 MG tablet Take 2 tabs on day#1, then 1 tab daily on Days #2-10 (Patient not taking: Reported on 10/04/2022) 11 tablet 1   fluticasone (CUTIVATE) 0.05 % cream SMARTSIG:Topical 1-2 Times Daily PRN (Patient not taking: Reported on 10/04/2022)     predniSONE (DELTASONE) 20 MG tablet Take 1 tablet (20 mg total) by mouth daily with breakfast. (Patient  not taking: Reported on 10/04/2022) 7 tablet 0   predniSONE (DELTASONE) 50 MG tablet Take 1 tablet daily for 5 days. (Patient not taking: Reported on 10/04/2022) 5 tablet 0   promethazine-dextromethorphan (PROMETHAZINE-DM) 6.25-15 MG/5ML syrup Take 5 mLs by mouth 4 (four) times daily as needed for cough. (Patient not taking: Reported on 10/04/2022) 118 mL 0   No facility-administered medications prior to visit.    Review of Systems  Constitutional:  Negative for chills, diaphoresis, fever, malaise/fatigue and weight loss.  HENT:  Negative for congestion.   Respiratory:  Positive for cough, shortness of breath and wheezing. Negative for hemoptysis and sputum production.   Cardiovascular:  Negative for chest pain, palpitations and leg swelling.  Musculoskeletal:  Positive for joint pain.  Endo/Heme/Allergies:  Positive for environmental allergies.     Objective:   Vitals:   10/04/22 1303  BP: 118/78  Pulse: 76  SpO2: 95%  Weight: 279 lb 6.9 oz (126.8 kg)  Height: _0  (1.753 m)   SpO2: 95 % O2 Device: None (Room air)  Body mass index is 41.27 kg/m.  Physical Exam: General: Well-appearing, no acute distress HENT: [place], AT Eyes: EOMI, no scleral icterus Respiratory: Clear to auscultation bilaterally.  No crackles, wheezing or rales Cardiovascular: RRR, -M/R/G, no JVD Extremities:-Edema,-tenderness Neuro: AAO x4, CNII-XII grossly intact Psych: Normal mood, normal affect  Data Reviewed:  Imaging: CXR 05/05/21 - Cardiomegaly. S/p biventricular ICD. Small bilateral pleural effusion  PFT: 01/27/14 FVC 3.35 (72%) FEV1 2.41 (68%) Ratio 65  TLC 94% DLCO 77% Interpretation: Moderate obstructive defect with mildly reduced DLCO. Borderline  bronchodilator response in FEV1 present.  Labs: CBC    Component Value Date/Time   WBC 7.1 03/20/2022 1526   RBC 4.04 (L) 03/20/2022 1526   HGB 13.4 03/20/2022 1526   HGB 12.8 (L) 12/19/2017 1532   HCT 40.4 03/20/2022 1526   HCT 38.0  12/19/2017 1532   PLT 188 03/20/2022 1526   PLT 208 12/19/2017 1532   MCV 100.0 03/20/2022 1526   MCV 99 (H) 12/19/2017 1532   MCH 33.2 03/20/2022 1526   MCHC 33.2 03/20/2022 1526   RDW 13.9 03/20/2022 1526   RDW 14.0 12/19/2017 1532   LYMPHSABS 1.0 12/20/2021 1608   LYMPHSABS 1.9 12/19/2017 1532   MONOABS 0.9 12/20/2021 1608   EOSABS 0.1 12/20/2021 1608   EOSABS 0.3 12/19/2017 1532   BASOSABS 0.0 12/20/2021 1608   BASOSABS 0.0 12/19/2017 1532   Abs eos 12/20/21 -100     Assessment & Plan:   Discussion: 66 year old male former smoker with COPD, childhood asthma, OSA not on CPAP, DM2, HTN, atrial fib on eliquis, NICM s/p AICD, chronic diastolic heart failure who presents for shortness of breath. Reviewed history and available data as noted above. Findings consistent with COPD-asthma overlap. Discussed clinical course and management of COPD/asthma including bronchodilator regimen and action plan for exacerbation.  COPD-asthma overlap --CONTINUE Dupixent as scheduled --START Trelegy 100 mcg ONE puff ONCE a day --CONTINUE Albuterol AS NEEDED for shortness of breath or wheezing --START regular aerobic exercise 30 minutes five days a week --Discussed health eating including heart healthy diet  Health Maintenance Immunization History  Administered Date(s) Administered   Fluad Quad(high Dose 65+) 06/29/2022   Influenza Inj Mdck Quad With Preservative 07/22/2019   Influenza Split 07/09/2011, 07/23/2012   Influenza Whole 09/16/2006, 08/26/2007, 07/27/2008, 07/26/2009, 07/07/2010   Influenza, High Dose Seasonal PF 06/22/2020, 07/07/2021   Influenza,inj,Quad PF,6+ Mos 06/30/2013, 08/19/2014, 06/27/2015, 07/13/2016, 07/09/2017, 07/18/2018   PFIZER(Purple Top)SARS-COV-2 Vaccination 03/21/2020, 04/11/2020, 11/03/2020   Pneumococcal Conjugate-13 04/29/2017   Pneumococcal Polysaccharide-23 03/29/2010, 07/07/2010, 02/23/2015, 03/17/2021   Td 10/16/2003   Tdap 02/23/2015   CT Lung Screen -  not qualified. Quit > 15 years ago  Orders Placed This Encounter  Procedures   AMB referral to pulmonary rehabilitation    Referral Priority:   Routine    Referral Type:   Consultation    Number of Visits Requested:   1   Meds ordered this encounter  Medications   Fluticasone-Umeclidin-Vilant (TRELEGY ELLIPTA) 100-62.5-25 MCG/ACT AEPB    Sig: Inhale 1 puff into the lungs daily.    Dispense:  180 each    Refill:  2    Return in about 3 months (around 01/03/2023).  I have spent a total time of 45-minutes on the day of the appointment reviewing prior documentation, coordinating care and discussing medical diagnosis and plan with the patient/family. Imaging, labs and tests included in this note have been reviewed and interpreted independently by me.  Blanchard, MD [facility] 10/04/2022 4:54 PM  Office Number [phone]

## 2022-10-04 NOTE — Patient Instructions (Signed)
  COPD-asthma overlap --CONTINUE Dupixent as scheduled --START Trelegy 100 mcg ONE puff ONCE a day --CONTINUE Albuterol AS NEEDED for shortness of breath or wheezing --START regular aerobic exercise 30 minutes five days a week --Discussed health eating including heart healthy diet  Follow-up with me in March

## 2022-10-05 ENCOUNTER — Ambulatory Visit (HOSPITAL_COMMUNITY)
Admission: RE | Admit: 2022-10-05 | Discharge: 2022-10-05 | Disposition: A | Discharge status: Home / Self Care | Payer: Medicare HMO | Source: Ambulatory Visit | Attending: Family Medicine | Admitting: Family Medicine

## 2022-10-05 ENCOUNTER — Encounter (HOSPITAL_COMMUNITY): Payer: Self-pay

## 2022-10-05 VITALS — BP 100/60 | HR 70 | Wt 280.0 lb

## 2022-10-05 DIAGNOSIS — Z79899 Other long term (current) drug therapy: Secondary | ICD-10-CM | POA: Diagnosis not present

## 2022-10-05 DIAGNOSIS — I2510 Atherosclerotic heart disease of native coronary artery without angina pectoris: Secondary | ICD-10-CM | POA: Insufficient documentation

## 2022-10-05 DIAGNOSIS — J4489 Other specified chronic obstructive pulmonary disease: Secondary | ICD-10-CM | POA: Insufficient documentation

## 2022-10-05 DIAGNOSIS — R7989 Other specified abnormal findings of blood chemistry: Secondary | ICD-10-CM

## 2022-10-05 DIAGNOSIS — I110 Hypertensive heart disease with heart failure: Secondary | ICD-10-CM | POA: Insufficient documentation

## 2022-10-05 DIAGNOSIS — X58XXXA Exposure to other specified factors, initial encounter: Secondary | ICD-10-CM | POA: Diagnosis not present

## 2022-10-05 DIAGNOSIS — S81802A Unspecified open wound, left lower leg, initial encounter: Secondary | ICD-10-CM | POA: Diagnosis not present

## 2022-10-05 DIAGNOSIS — Z95810 Presence of automatic (implantable) cardiac defibrillator: Secondary | ICD-10-CM | POA: Insufficient documentation

## 2022-10-05 DIAGNOSIS — I428 Other cardiomyopathies: Secondary | ICD-10-CM | POA: Diagnosis not present

## 2022-10-05 DIAGNOSIS — G4733 Obstructive sleep apnea (adult) (pediatric): Secondary | ICD-10-CM | POA: Insufficient documentation

## 2022-10-05 DIAGNOSIS — I5022 Chronic systolic (congestive) heart failure: Secondary | ICD-10-CM | POA: Diagnosis not present

## 2022-10-05 DIAGNOSIS — Z87891 Personal history of nicotine dependence: Secondary | ICD-10-CM | POA: Diagnosis not present

## 2022-10-05 DIAGNOSIS — Z7901 Long term (current) use of anticoagulants: Secondary | ICD-10-CM | POA: Diagnosis not present

## 2022-10-05 DIAGNOSIS — Z9981 Dependence on supplemental oxygen: Secondary | ICD-10-CM | POA: Diagnosis not present

## 2022-10-05 DIAGNOSIS — Z8616 Personal history of COVID-19: Secondary | ICD-10-CM | POA: Insufficient documentation

## 2022-10-05 DIAGNOSIS — I4821 Permanent atrial fibrillation: Secondary | ICD-10-CM | POA: Diagnosis not present

## 2022-10-05 DIAGNOSIS — M7989 Other specified soft tissue disorders: Secondary | ICD-10-CM | POA: Insufficient documentation

## 2022-10-05 DIAGNOSIS — I442 Atrioventricular block, complete: Secondary | ICD-10-CM | POA: Insufficient documentation

## 2022-10-05 DIAGNOSIS — J449 Chronic obstructive pulmonary disease, unspecified: Secondary | ICD-10-CM

## 2022-10-05 LAB — CBC
HCT: 40.5 % (ref 39.0–52.0)
Hemoglobin: 13.1 g/dL (ref 13.0–17.0)
MCH: 33.5 pg (ref 26.0–34.0)
MCHC: 32.3 g/dL (ref 30.0–36.0)
MCV: 103.6 fL — ABNORMAL HIGH (ref 80.0–100.0)
Platelets: 239 10*3/uL (ref 150–400)
RBC: 3.91 MIL/uL — ABNORMAL LOW (ref 4.22–5.81)
RDW: 13.4 % (ref 11.5–15.5)
WBC: 8.9 10*3/uL (ref 4.0–10.5)
nRBC: 0 % (ref 0.0–0.2)

## 2022-10-05 LAB — BASIC METABOLIC PANEL
Anion gap: 8 (ref 5–15)
BUN: 25 mg/dL — ABNORMAL HIGH (ref 8–23)
CO2: 26 mmol/L (ref 22–32)
Calcium: 8.5 mg/dL — ABNORMAL LOW (ref 8.9–10.3)
Chloride: 104 mmol/L (ref 98–111)
Creatinine, Ser: 1.28 mg/dL — ABNORMAL HIGH (ref 0.61–1.24)
GFR, Estimated: >60 mL/min (ref >60)
Glucose, Bld: 106 mg/dL — ABNORMAL HIGH (ref 70–99)
Potassium: 3.9 mmol/L (ref 3.5–5.1)
Sodium: 138 mmol/L (ref 135–145)

## 2022-10-05 LAB — BRAIN NATRIURETIC PEPTIDE: B Natriuretic Peptide: 36 pg/mL (ref 0.0–100.0)

## 2022-10-05 LAB — MAGNESIUM: Magnesium: 2.4 mg/dL (ref 1.7–2.4)

## 2022-10-05 MED ORDER — POTASSIUM CHLORIDE CRYS ER 20 MEQ PO TBCR: 40.0000 meq | EXTENDED_RELEASE_TABLET | Freq: Every day | ORAL | 3 refills | Status: DC

## 2022-10-05 MED ORDER — TORSEMIDE 20 MG PO TABS: 80.0000 mg | ORAL_TABLET | Freq: Two times a day (BID) | ORAL | 3 refills | Status: DC

## 2022-10-05 NOTE — Patient Instructions (Addendum)
INCREASE Torsemide to 80 mg Twice daily  INCREASE Potassium to 40 MEQ ( 2 Tabs ) daily.  Repeat blood work in 2 weeks   Your physician has requested that you have an echocardiogram. Echocardiography is a painless test that uses sound waves to create images of your heart. It provides your doctor with information about the size and shape of your heart and how well your heart's chambers and valves are working. This procedure takes approximately one hour. There are no restrictions for this procedure. Please do NOT wear cologne, perfume, aftershave, or lotions (deodorant is allowed). Please arrive 15 minutes prior to your appointment time.  You have been referred to the wound clinic. They will call you to arrange your appointment  Your physician recommends that you schedule a follow-up appointment in: 4 months ( April 2024)  ** please call the office in February to arrange your follow up appointment **  If you have any questions or concerns before your next appointment please send [place] a message through [place] or call our office at [phone].    TO LEAVE A MESSAGE FOR THE NURSE SELECT OPTION 2, PLEASE LEAVE A MESSAGE INCLUDING: YOUR NAME DATE OF BIRTH CALL BACK NUMBER REASON FOR CALL**this is important as we prioritize the call backs  YOU WILL RECEIVE A CALL BACK THE SAME DAY AS LONG AS YOU CALL BEFORE 4:00 PM  At the [facility], you and your health needs are our priority. As part of our continuing mission to provide you with exceptional heart care, we have created designated Provider Care Teams. These Care Teams include your primary Cardiologist (physician) and Advanced Practice Providers (APPs- Physician Assistants and Nurse Practitioners) who all work together to provide you with the care you need, when you need it.   You may see any of the following providers on your designated Care Team at your next follow up: Dr Glori Bickers Dr Loralie Champagne Dr. Roxana Hires, NP Lyda Jester, [place] [facility] [place], [place] Forestine Na, NP Audry Riles, PharmD   Please be sure to bring in all your medications bottles to every appointment.

## 2022-10-08 ENCOUNTER — Other Ambulatory Visit: Payer: Self-pay | Admitting: Family Medicine

## 2022-10-08 DIAGNOSIS — J441 Chronic obstructive pulmonary disease with (acute) exacerbation: Secondary | ICD-10-CM

## 2022-10-09 ENCOUNTER — Telehealth (HOSPITAL_COMMUNITY): Payer: Self-pay | Admitting: Cardiology

## 2022-10-09 ENCOUNTER — Telehealth (HOSPITAL_COMMUNITY): Payer: Self-pay | Admitting: *Deleted

## 2022-10-09 ENCOUNTER — Other Ambulatory Visit (HOSPITAL_COMMUNITY): Payer: Self-pay | Admitting: Family Medicine

## 2022-10-09 DIAGNOSIS — S81802A Unspecified open wound, left lower leg, initial encounter: Secondary | ICD-10-CM

## 2022-10-09 NOTE — Addendum Note (Signed)
Addended by: Rodman Pickle on: 10/09/2022 11:19 PM   Modules accepted: Orders

## 2022-10-09 NOTE — Telephone Encounter (Signed)
Pt reports he accidentally threw away one of his heart medications and he needs help identifying the pill to request a refill. Reports local pharmacy was no help, requests to review with hf pharmacist

## 2022-10-10 ENCOUNTER — Telehealth: Payer: Self-pay | Admitting: Pulmonary Disease

## 2022-10-10 ENCOUNTER — Ambulatory Visit (HOSPITAL_COMMUNITY): Admission: RE | Admit: 2022-10-10 | Payer: Medicare HMO | Source: Ambulatory Visit

## 2022-10-10 NOTE — Telephone Encounter (Signed)
Called patient this morning and he states that he is moving this week and he feels like he is exerting himself and the dust is getting to him. He states he was on oxygen in the past but feels like he is needing oxygen again. He states that he feels like it is worse at night.   Sir are you ok with me ordering an ONO to see if he needs oxygen at night. He denied making an appointment to be seen   Please advise sir since Dr Loanne Drilling is out.

## 2022-10-10 NOTE — Telephone Encounter (Signed)
Called and spoke with pt letting him know info per NM and he verbalized understanding. Joellen Jersey had an opening 12/29 so have scheduled pt for that appt time. Due to pt having an upcoming appt, will hold off on ordering ONO until pt's appt so that way Joellen Jersey can evaluate patient and we can get all taken care of for pt at that appt. Nothing further needed.

## 2022-10-10 NOTE — Telephone Encounter (Signed)
OK with ordering ONO on room air. Can we make an appointment sooner with either ellison or APP - worsening nocturnal symptoms could reflect poorer control of asthma, excess fluid or something else entirely.

## 2022-10-11 ENCOUNTER — Other Ambulatory Visit (HOSPITAL_COMMUNITY): Payer: Self-pay | Admitting: Cardiology

## 2022-10-12 ENCOUNTER — Ambulatory Visit: Payer: Medicare HMO | Admitting: Nurse Practitioner

## 2022-10-12 ENCOUNTER — Other Ambulatory Visit (HOSPITAL_COMMUNITY): Payer: Medicare HMO

## 2022-10-13 ENCOUNTER — Other Ambulatory Visit: Payer: Self-pay | Admitting: Internal Medicine

## 2022-10-16 ENCOUNTER — Other Ambulatory Visit: Payer: Self-pay | Admitting: Family Medicine

## 2022-10-16 DIAGNOSIS — J441 Chronic obstructive pulmonary disease with (acute) exacerbation: Secondary | ICD-10-CM

## 2022-10-16 NOTE — Telephone Encounter (Signed)
Please refill as per office routine med refill policy (all routine meds to be refilled for 3 mo or monthly (per pt preference) up to one year from last visit, then month to month grace period for 3 mo, then further med refills will have to be denied) ? ?

## 2022-10-16 NOTE — Progress Notes (Unsigned)
[place] Phone: [phone] Subjective:   [name], am serving as a scribe for Dr. Hulan Saas.  I'm seeing this patient by the request  of:  [name], MD  CC: Bilateral shoulder pain, hip pain  MBT:[id number]  08/21/2022 Chronic problem with potentially worsening symptoms.  Patient does have significant arthritic changes of the lower back previously with a spondylolisthesis.  We will see if there is any worsening changes at this time.  If patient has any weakness of the lower extremities we do need to consider advanced imaging.  Hopefully patient will improve like he did not multiple years ago.  Follow-up again in 4 to 6 weeks.  Prednisone given 20 mg daily for the next 7 days.     Bilateral shoulders injected again today.  Tolerated the procedure well.  Patient does have severe arthritic changes.  Would not want surgical intervention if he can help it.  Chronic problem with worsening symptoms.  Follow-up with me again in 8 to 10 weeks.      Update 10/18/2022 CREWS MCCOLLAM is a 67 y.o. male coming in with complaint of B shoulder pain. Patient states that his L hip pain has increased over past month. Giving out on him. Pain over lateral aspect.   B shoulders are bothering him and he would like injections today.        Past Medical History:  Diagnosis Date   AICD (automatic cardioverter/defibrillator) present    Anemia    supposed to be taking Vit B but doesn't   ANXIETY    takes Xanax nightly   Arthritis    Asthma    Albuterol prn and Advair daily;also takes Prednisone daily   Atrial fibrillation ([place]) 09/06/2015   Cardiomyopathy ([place])    a. EF 25% TEE July 2013; b. EF normalized 2015;  c. 03/2015 Echo: EF 40-45%, difrf HK, PASP 38 mmHg, Mild MR, sev LAE/RAE.   CHF (congestive heart failure) (HCC)    Chronic constipation    takes OTC stool softener   COPD (chronic obstructive pulmonary  disease) (HCC)    "one dr says COPD; one dr says emphysema" (09/18/2017)   DEPRESSION    takes Zoloft and Doxepin daily   Diverticulitis    DYSKINESIA, ESOPHAGUS    Dysrhythmia    Atrial Fibrilation   Emphysema of lung [facility])    "one dr says COPD; one dr says emphysema" (09/18/2017)   Essential hypertension        FIBROMYALGIA    GERD (gastroesophageal reflux disease)        Glaucoma    HYPERLIPIDEMIA    a. Intolerant to statins.   INSOMNIA    takes Ambien nightly   Myocardial infarction [facility])    a. 2012 Myoview notable for prior infarct;  b. 03/2015 Lexiscan CL: EF 37%, diff HK, small area of inferior infarct from apex to base-->Med Rx.   Myocardial infarction ([place])    O2 dependent    "2.5L q hs & prn" (09/18/2017)   Paroxysmal atrial fibrillation ([place])    a. CHA2DS2VASc = 3--> takes Coumadin;  b. 03/15/2015 Successful TEE/DCCV;  c. 03/2015 recurrent afib, Amio d/c'd in setting of hyperthyroidism.   Peripheral neuropathy    Pneumonia 12/2016   Rash and other nonspecific skin eruption 04/12/2009   no cause found saw dermatologists x 2 and allergist   SLEEP APNEA, OBSTRUCTIVE    a. doesn't use CPAP  Syncope    a. 03/2015 s/p MDT LINQ.   Type II diabetes mellitus ([place])        Past Surgical History:  Procedure Laterality Date   ACNE CYST REMOVAL     2 on back    AV NODE ABLATION N/A 10/25/2017   Procedure: AV NODE ABLATION;  Surgeon: Deboraha Sprang, MD;  Location: [place] CV LAB;  Service: Cardiovascular;  Laterality: N/A;   BIV ICD INSERTION CRT-D N/A 09/18/2017   Procedure: BIV ICD INSERTION CRT-D;  Surgeon: Deboraha Sprang, MD;  Location: [place] CV LAB;  Service: Cardiovascular;  Laterality: N/A;   CARDIAC CATHETERIZATION N/A 03/21/2016   Procedure: Right/Left Heart Cath and Coronary Angiography;  Surgeon: Larey Dresser, MD;  Location: [place] CV LAB;  Service: Cardiovascular;  Laterality: N/A;   CARDIOVERSION  04/18/2012   Procedure: CARDIOVERSION;  Surgeon: Fay Records, MD;  Location: [place];  Service: Cardiovascular;  Laterality: N/A;   CARDIOVERSION  04/25/2012   Procedure: CARDIOVERSION;  Surgeon: Thayer Headings, MD;  Location: [place];  Service: Cardiovascular;  Laterality: N/A;   CARDIOVERSION  04/25/2012   Procedure: CARDIOVERSION;  Surgeon: Fay Records, MD;  Location: [place];  Service: Cardiovascular;  Laterality: N/A;   CARDIOVERSION  05/09/2012   Procedure: CARDIOVERSION;  Surgeon: Sherren Mocha, MD;  Location: [place];  Service: Cardiovascular;  Laterality: N/A;  changed from crenshaw to cooper by trish/leone-endo   CARDIOVERSION N/A 03/15/2015   Procedure: CARDIOVERSION;  Surgeon: Thayer Headings, MD;  Location: [place];  Service: Cardiovascular;  Laterality: N/A;   COLONOSCOPY     COLONOSCOPY WITH PROPOFOL N/A 10/21/2014   Procedure: COLONOSCOPY WITH PROPOFOL;  Surgeon: Ladene Artist, MD;  Location: [facility];  Service: Endoscopy;  Laterality: N/A;   EP IMPLANTABLE DEVICE N/A 04/06/2015   Procedure: Loop Recorder Insertion;  Surgeon: Evans Lance, MD;  Location: [place] CV LAB;  Service: Cardiovascular;  Laterality: N/A;   ESOPHAGOGASTRODUODENOSCOPY     JOINT REPLACEMENT     LOOP RECORDER REMOVAL N/A 09/18/2017   Procedure: LOOP RECORDER REMOVAL;  Surgeon: Deboraha Sprang, MD;  Location: [place] CV LAB;  Service: Cardiovascular;  Laterality: N/A;   RIGHT/LEFT HEART CATH AND CORONARY ANGIOGRAPHY N/A 01/28/2017   Procedure: Right/Left Heart Cath and Coronary Angiography;  Surgeon: Larey Dresser, MD;  Location: [place] CV LAB;  Service: Cardiovascular;  Laterality: N/A;   TEE WITHOUT CARDIOVERSION  04/25/2012   Procedure: TRANSESOPHAGEAL ECHOCARDIOGRAM (TEE);  Surgeon: Thayer Headings, MD;  Location: [place];  Service: Cardiovascular;  Laterality: N/A;   TEE WITHOUT CARDIOVERSION N/A 03/15/2015   Procedure: TRANSESOPHAGEAL ECHOCARDIOGRAM (TEE);  Surgeon: Thayer Headings, MD;  Location: [place];  Service:  Cardiovascular;  Laterality: N/A;   TONSILLECTOMY AND ADENOIDECTOMY     TOTAL KNEE ARTHROPLASTY Right 06/15/2014   Procedure: TOTAL KNEE ARTHROPLASTY;  Surgeon: Renette Butters, MD;  Location: [place];  Service: Orthopedics;  Laterality: Right;   TOTAL KNEE ARTHROPLASTY Left 10/17/2021   Procedure: Left TOTAL KNEE ARTHROPLASTY;  Surgeon: Mcarthur Rossetti, MD;  Location: [place];  Service: Orthopedics;  Laterality: Left;   Social History   Socioeconomic History   Marital status: Divorced    Spouse name: Not on file   Number of children: 2   Years of education: Not on file   Highest education level: Not on file  Occupational History   Occupation: retired/disabled. prev worked in Therapist, sports.  Employer: DISABLED  Tobacco Use   Smoking status: Former    Packs/day: 2.00    Years: 30.00    Total pack years: 60.00    Types: Cigarettes    Quit date: 10/16/2007    Years since quitting: 15.0   Smokeless tobacco: Never  Vaping Use   Vaping Use: Never used  Substance and Sexual Activity   Alcohol use: No   Drug use: No   Sexual activity: Not Currently  Other Topics Concern   Not on file  Social History Narrative   Lives alone.   Social Determinants of Health   Financial Resource Strain: Low Risk  (06/29/2022)   Overall Financial Resource Strain (CARDIA)    Difficulty of Paying Living Expenses: Not hard at all  Food Insecurity: No Food Insecurity (06/29/2022)   Hunger Vital Sign    Worried About Running Out of Food in the Last Year: Never true    Ran Out of Food in the Last Year: Never true  Transportation Needs: No Transportation Needs (08/21/2022)   PRAPARE - Hydrologist (Medical): No    Lack of Transportation (Non-Medical): No  Physical Activity: Inactive (06/29/2022)   Exercise Vital Sign    Days of Exercise per Week: 0 days    Minutes of Exercise per Session: 0 min  Stress: No Stress Concern Present (06/29/2022)   [place]    Feeling of Stress : Not at all  Social Connections: Moderately Integrated (06/29/2022)   Social Connection and Isolation Panel [NHANES]    Frequency of Communication with Friends and Family: More than three times a week    Frequency of Social Gatherings with Friends and Family: More than three times a week    Attends Religious Services: More than 4 times per year    Active Member of [employer] or Organizations: Yes    Attends Music therapist: More than 4 times per year    Marital Status: Divorced   Allergies  Allergen Reactions   Amiodarone Other (See Comments)    hyperthyroidism   Statins Other (See Comments)    myalgia   Tape Other (See Comments)    Skin Tears Use Paper Tape Only   Family History  Problem Relation Age of Onset   COPD Mother    Asthma Mother    Colon polyps Mother    Allergies Mother    Hypothyroidism Mother    Asthma Maternal Grandmother    Colon cancer Neg Hx     Current Outpatient Medications (Endocrine & Metabolic):    dapagliflozin propanediol (FARXIGA) 10 MG TABS tablet, Take 1 tablet (10 mg total) by mouth daily before breakfast.   predniSONE (DELTASONE) 20 MG tablet, Take 1 tablet (20 mg total) by mouth 2 (two) times daily with a meal.  Current Outpatient Medications (Cardiovascular):    carvedilol (COREG) 6.25 MG tablet, TAKE 1 TABLET BY MOUTH TWICE A DAY   eplerenone (INSPRA) 50 MG tablet, Take 1 tablet (50 mg total) by mouth daily.   Evolocumab (REPATHA SURECLICK) 782 MG/ML SOAJ, INJECT 1 PEN INTO THE SKIN EVERY 14 (FOURTEEN) DAYS.   losartan (COZAAR) 25 MG tablet, Take 1 tablet (25 mg total) by mouth daily.   metolazone (ZAROXOLYN) 2.5 MG tablet, Take 1 tablet (2.5 mg total) by mouth as directed. With additional 40 meq of potassium   torsemide (DEMADEX) 20 MG tablet, Take 4 tablets (80 mg total) by mouth 2 (two)  times daily.  Current Outpatient Medications (Respiratory):     albuterol (VENTOLIN HFA) 108 (90 Base) MCG/ACT inhaler, INHALE 2 PUFFS BY MOUTH 4 TIMES A DAY AS NEEDED   fluticasone (FLONASE) 50 MCG/ACT nasal spray, PLACE 2 SPRAYS INTO BOTH NOSTRILS DAILY AS NEEDED FOR ALLERGIES OR RHINITIS.   Fluticasone-Umeclidin-Vilant (TRELEGY ELLIPTA) 100-62.5-25 MCG/ACT AEPB, Inhale 1 puff into the lungs daily.   ipratropium-albuterol (DUONEB) 0.5-2.5 (3) MG/3ML SOLN, TAKE 3 ML BY NEBULIZATION EVERY 4 HOURS AS NEEDED (WHEEZING).  Current Outpatient Medications (Analgesics):    acetaminophen (TYLENOL) 500 MG tablet, Take 500 mg by mouth every 6 (six) hours as needed for moderate pain.   traMADol (ULTRAM) 50 MG tablet, TAKE 1 TABLET BY MOUTH EVERY DAY AS NEEDED  Current Outpatient Medications (Hematological):    ELIQUIS 5 MG TABS tablet, TAKE 1 TABLET BY MOUTH TWICE A DAY  Current Outpatient Medications (Other):    ALPRAZolam (XANAX) 0.5 MG tablet, Take 2 tablets (1 mg total) by mouth at bedtime.   augmented betamethasone dipropionate (DIPROLENE-AF) 0.05 % cream, Apply 1 application topically daily as needed (itching).   carisoprodol (SOMA) 350 MG tablet, TAKE 1 TABLET BY MOUTH THREE TIMES A DAY AS NEEDED FOR MUSCLE SPASM   Cholecalciferol 100 MCG (4000 UT) CAPS, 1 tab by mouth once daily   doxepin (SINEQUAN) 10 MG capsule, TAKE 2 CAPSULES (20 MG TOTAL) BY MOUTH AT BEDTIME AS NEEDED.   Dupilumab (DUPIXENT) 300 MG/2ML SOPN, Inject 300 mg into the skin every 14 (fourteen) days.   hydrOXYzine (ATARAX/VISTARIL) 10 MG tablet, TAKE 1 TABLET BY MOUTH 3 TIMES DAILY AS NEEDED FOR ITCHING.   ketoconazole (NIZORAL) 2 % cream, Apply 1 Application topically daily.   KLOR-CON M20 20 MEQ tablet, TAKE 1 TABLET BY MOUTH EVERY DAY   lidocaine (LIDODERM) 5 %, APPLY 1 PATCH TO AFFECTED AREA FOR UP TO 12 HOURS. DO NOT USE MORE THAN ONE PATCH IN 24 HOURS.   omeprazole (PRILOSEC) 20 MG capsule, TAKE 1 CAPSULE BY MOUTH TWICE A DAY   potassium chloride SA (KLOR-CON M) 20 MEQ tablet, Take 2  tablets (40 mEq total) by mouth daily.   Respiratory Therapy Supplies (FULL KIT NEBULIZER SET) MISC, 1 each by Does not apply route as needed.   silver sulfADIAZINE (SILVADENE) 1 % cream, APPLY TOPICALLY TO AFFECTED AREA EVERY DAY   tamsulosin (FLOMAX) 0.4 MG CAPS capsule, TAKE 1 CAPSULE BY MOUTH EVERY DAY   traZODone (DESYREL) 100 MG tablet, TAKE 2 TABLETS BY MOUTH AT BEDTIME AS NEEDED   TUMS 500 MG chewable tablet, Chew 500-2,000 mg by mouth every 4 (four) hours as needed for indigestion or heartburn.    venlafaxine XR (EFFEXOR XR) 150 MG 24 hr capsule, Take 1 capsule (150 mg total) by mouth daily with breakfast.   zolpidem (AMBIEN) 10 MG tablet, TAKE 1 TABLET BY MOUTH EVERY DAY AT BEDTIME AS NEEDED   Reviewed prior external information including notes and imaging from  primary care provider As well as notes that were available from care everywhere and other healthcare systems.  Past medical history, social, surgical and family history all reviewed in electronic medical record.  No pertanent information unless stated regarding to the chief complaint.   Review of Systems:  No headache, visual changes, nausea, vomiting, diarrhea, constipation, dizziness, abdominal pain, skin rash, fevers, chills, night sweats, weight loss, swollen lymph nodes, body aches, joint swelling, chest pain, shortness of breath, mood changes. POSITIVE muscle aches  Objective  Blood pressure 112/70, pulse [name])  57, height 5' 9" (1.753 m), weight 280 lb (127 kg), SpO2 98 %.   General: No apparent distress alert and oriented x3 mood and affect normal, dressed appropriately.  HEENT: Pupils equal, extraocular movements intact  Respiratory: Patient's speak in full sentences and does not appear short of breath  2+ pitting edema of the lower extremities.  Patient is tender to palpation over the greater trochanteric area on the left side.  Positive FABER test noted.  Negative straight leg test.  Bilateral shoulders do have  some crepitus noted.  Patient does have some atrophy of the shoulders bilaterally.  Seems to be more pain over the glenohumeral joint.   Procedure: Real-time Ultrasound Guided Injection of right glenohumeral joint Device: GE Logiq Q7  Ultrasound guided injection is preferred based studies that show increased duration, increased effect, greater accuracy, decreased procedural pain, increased response rate with ultrasound guided versus blind injection.  Verbal informed consent obtained.  Time-out conducted.  Noted no overlying erythema, induration, or other signs of local infection.  Skin prepped in a sterile fashion.  Local anesthesia: Topical Ethyl chloride.  With sterile technique and under real time ultrasound guidance:  Joint visualized.  23g 1  inch needle inserted posterior approach. Pictures taken for needle placement. Patient did have injection of 2 cc of 0.5% Marcaine, and 1.0 cc of Kenalog 40 mg/dL. Completed without difficulty  Pain immediately resolved suggesting accurate placement of the medication.  Advised to call if fevers/chills, erythema, induration, drainage, or persistent bleeding.  Impression: Technically successful ultrasound guided injection.  Procedure: Real-time Ultrasound Guided Injection of left glenohumeral joint Device: GE Logiq E  Ultrasound guided injection is preferred based studies that show increased duration, increased effect, greater accuracy, decreased procedural pain, increased response rate with ultrasound guided versus blind injection.  Verbal informed consent obtained.  Time-out conducted.  Noted no overlying erythema, induration, or other signs of local infection.  Skin prepped in a sterile fashion.  Local anesthesia: Topical Ethyl chloride.  With sterile technique and under real time ultrasound guidance:  Joint visualized.  21g 2 inch needle inserted posterior approach. Pictures taken for needle placement. Patient did have injection of 2 cc of 0.5%  Marcaine, and 1cc of Kenalog 40 mg/dL. Completed without difficulty  Pain immediately resolved suggesting accurate placement of the medication.  Advised to call if fevers/chills, erythema, induration, drainage, or persistent bleeding.  Impression: Technically successful ultrasound guided injection.   Procedure: Real-time Ultrasound Guided Injection of left  greater trochanteric bursitis secondary to patient's body habitus Device: GE Logiq Q7  Ultrasound guided injection is preferred based studies that show increased duration, increased effect, greater accuracy, decreased procedural pain, increased response rate, and decreased cost with ultrasound guided versus blind injection.  Verbal informed consent obtained.  Time-out conducted.  Noted no overlying erythema, induration, or other signs of local infection.  Skin prepped in a sterile fashion.  Local anesthesia: Topical Ethyl chloride.  With sterile technique and under real time ultrasound guidance:  Greater trochanteric area was visualized and patient's bursa was noted. A 22-gauge 3 inch needle was inserted and 4 cc of 0.5% Marcaine and 1 cc of Kenalog 40 mg/dL was injected. Pictures taken Completed without difficulty  Pain immediately resolved suggesting accurate placement of the medication.  Advised to call if fevers/chills, erythema, induration, drainage, or persistent bleeding.  Impression: Technically successful ultrasound guided injection.   Impression and Recommendations:     The above documentation has been reviewed and is accurate and complete Olevia Bowens  Tamala Julian, DO

## 2022-10-17 ENCOUNTER — Telehealth (HOSPITAL_COMMUNITY): Payer: Self-pay

## 2022-10-17 NOTE — Telephone Encounter (Signed)
Called patient to see if he was interested in participating in the Pulmonary Rehab Program. Patient stated yes. Patient will come in for orientation on 11/12/22 @ 1PM and will attend the 1:15PM exercise class.   Tourist information centre manager.

## 2022-10-17 NOTE — Telephone Encounter (Signed)
Pt insurance is active and benefits verified through [facility].Co-pay $20.00, DED $0.00/$0.00 met, out of pocket $3,600.00/$0.00 met, co-insurance 0%. No pre-authorization required. [place] B./[employer], 10/17/22 @ 2:36PM, WSF#[id number]

## 2022-10-18 ENCOUNTER — Ambulatory Visit: Payer: Medicare HMO | Admitting: Family Medicine

## 2022-10-18 ENCOUNTER — Ambulatory Visit: Payer: Self-pay

## 2022-10-18 VITALS — BP 112/70 | HR 57 | Ht 69.0 in | Wt 280.0 lb

## 2022-10-18 DIAGNOSIS — M7062 Trochanteric bursitis, left hip: Secondary | ICD-10-CM

## 2022-10-18 DIAGNOSIS — M12811 Other specific arthropathies, not elsewhere classified, right shoulder: Secondary | ICD-10-CM | POA: Diagnosis not present

## 2022-10-18 DIAGNOSIS — G8929 Other chronic pain: Secondary | ICD-10-CM | POA: Diagnosis not present

## 2022-10-18 DIAGNOSIS — M12812 Other specific arthropathies, not elsewhere classified, left shoulder: Secondary | ICD-10-CM | POA: Diagnosis not present

## 2022-10-18 MED ORDER — PREDNISONE 20 MG PO TABS: 20.0000 mg | ORAL_TABLET | Freq: Two times a day (BID) | ORAL | 0 refills | Status: DC

## 2022-10-18 NOTE — Assessment & Plan Note (Signed)
Chronic problem with worsening symptoms.  Given injection today, was able to ambulate better.  Do feel there is some underlying arthritis will monitor.  Discussed icing regimen and home exercises, which activities to do and which ones to avoid.  Patient will continue to ambulate with the cane.  Follow-up again in 6 to 8 weeks.  Social determinant health as patient is highly inactive secondary to his chronic comorbidities.

## 2022-10-18 NOTE — Assessment & Plan Note (Signed)
Chronic problem with worsening symptoms still.  Still wants to avoid any surgical intervention.  Understands that these are only temporary but does give him improvement for the next 8 to 10 weeks.  Patient will follow-up again in that amount of time for further evaluation and treatment.

## 2022-10-18 NOTE — Patient Instructions (Signed)
Good to see you  Injection in hip today  Bilateral shoulder injection today  Prednisone refilled  Follow up in 2-3 months

## 2022-10-19 ENCOUNTER — Ambulatory Visit (INDEPENDENT_AMBULATORY_CARE_PROVIDER_SITE_OTHER): Payer: Medicare HMO

## 2022-10-19 DIAGNOSIS — Z95810 Presence of automatic (implantable) cardiac defibrillator: Secondary | ICD-10-CM

## 2022-10-19 DIAGNOSIS — I5022 Chronic systolic (congestive) heart failure: Secondary | ICD-10-CM

## 2022-10-19 NOTE — Progress Notes (Signed)
EPIC Encounter for ICM Monitoring  Patient Name: [name] is a 67 y.o. male Date: 10/19/2022 Primary Care Physican: [name], MD Primary Cardiologist: [name] Electrophysiologist: Vergie Living Pacing: 99.4%    10/02/2022 Weight: 265 lbs     Spoke with patient and heart failure questions reviewed.  Transmission results reviewed.  Pt reports legs are swollen from knee to ankle and will have test will be done 10/22/2022 and 10/24/2022.   Optivol thoracic impedance suggesting fluid levels returned to normal after Torsemide increase.   Prescribed:  Torsemide 20 mg 4 tablet(s) (80 mg total) by mouth twice a day.   Potassium 20 mEq take 1 tablets (20 mEq total) daily Metolazone 2.5 mg take 1 tablet by mouth as directed   Labs: 10/24/2022 BMET scheduled 10/05/2022 Creatinine 1.28, BUN 25, Potassium 3.9, Sodium 138, GFR >60 03/30/2022 Creatinine 1.37, BUN 29, Potassium 44, Sodium 140 03/20/2022 Creatinine 1.51, BUN 36, Potassium 4.5, Sodium 135  12/20/2021 Creatinine 1.00, BUN 27, Potassium 4.3, Sodium 140, GFR 78.15 12/15/2021 Creatinine 1.05, BUN 23, Potassium 4.5, Sodium 137, GFR >60 A complete set of results can be found in Results Review.   Recommendations:  No changes and encouraged to call if experiencing any fluid symptoms.   Follow-up plan: ICM clinic phone appointment on 11/12/2022.   91 day device clinic remote transmission 10/24/2022.   EP/Cardiology Office Visits:    Recall 10/28/2022 with Dr [name].   4 month HF clinic appt due 01/2023 (no recall)   Copy of ICM check sent to Dr. [name].    3 month ICM trend: 10/19/2022.    12-14 Month ICM trend:     [name], RN 10/19/2022 1:54 PM

## 2022-10-22 ENCOUNTER — Ambulatory Visit (HOSPITAL_COMMUNITY)
Admission: RE | Admit: 2022-10-22 | Discharge: 2022-10-22 | Disposition: A | Discharge status: Home / Self Care | Payer: Medicare HMO | Source: Ambulatory Visit | Attending: Cardiology | Admitting: Cardiology

## 2022-10-22 DIAGNOSIS — I70201 Unspecified atherosclerosis of native arteries of extremities, right leg: Secondary | ICD-10-CM | POA: Diagnosis not present

## 2022-10-22 DIAGNOSIS — S81802A Unspecified open wound, left lower leg, initial encounter: Secondary | ICD-10-CM | POA: Diagnosis not present

## 2022-10-22 DIAGNOSIS — I70202 Unspecified atherosclerosis of native arteries of extremities, left leg: Secondary | ICD-10-CM | POA: Diagnosis not present

## 2022-10-23 LAB — VAS US ABI WITH/WO TBI

## 2022-10-24 ENCOUNTER — Ambulatory Visit (HOSPITAL_COMMUNITY)
Admission: RE | Admit: 2022-10-24 | Discharge: 2022-10-24 | Disposition: A | Discharge status: Home / Self Care | Payer: Medicare HMO | Source: Ambulatory Visit | Attending: Cardiology | Admitting: Cardiology

## 2022-10-24 ENCOUNTER — Ambulatory Visit (INDEPENDENT_AMBULATORY_CARE_PROVIDER_SITE_OTHER): Payer: Medicare HMO

## 2022-10-24 ENCOUNTER — Telehealth (HOSPITAL_COMMUNITY): Payer: Self-pay | Admitting: Pharmacist

## 2022-10-24 ENCOUNTER — Ambulatory Visit (HOSPITAL_COMMUNITY)
Admission: RE | Admit: 2022-10-24 | Discharge: 2022-10-24 | Disposition: A | Discharge status: Home / Self Care | Payer: Medicare HMO | Source: Ambulatory Visit

## 2022-10-24 DIAGNOSIS — I509 Heart failure, unspecified: Secondary | ICD-10-CM | POA: Diagnosis not present

## 2022-10-24 DIAGNOSIS — I110 Hypertensive heart disease with heart failure: Secondary | ICD-10-CM | POA: Insufficient documentation

## 2022-10-24 DIAGNOSIS — E119 Type 2 diabetes mellitus without complications: Secondary | ICD-10-CM | POA: Insufficient documentation

## 2022-10-24 DIAGNOSIS — I252 Old myocardial infarction: Secondary | ICD-10-CM | POA: Diagnosis not present

## 2022-10-24 DIAGNOSIS — I4891 Unspecified atrial fibrillation: Secondary | ICD-10-CM | POA: Insufficient documentation

## 2022-10-24 DIAGNOSIS — E785 Hyperlipidemia, unspecified: Secondary | ICD-10-CM | POA: Insufficient documentation

## 2022-10-24 DIAGNOSIS — I5022 Chronic systolic (congestive) heart failure: Secondary | ICD-10-CM

## 2022-10-24 DIAGNOSIS — I442 Atrioventricular block, complete: Secondary | ICD-10-CM | POA: Diagnosis not present

## 2022-10-24 DIAGNOSIS — K219 Gastro-esophageal reflux disease without esophagitis: Secondary | ICD-10-CM | POA: Diagnosis not present

## 2022-10-24 DIAGNOSIS — J449 Chronic obstructive pulmonary disease, unspecified: Secondary | ICD-10-CM | POA: Diagnosis not present

## 2022-10-24 DIAGNOSIS — G4730 Sleep apnea, unspecified: Secondary | ICD-10-CM | POA: Diagnosis not present

## 2022-10-24 DIAGNOSIS — I5032 Chronic diastolic (congestive) heart failure: Secondary | ICD-10-CM | POA: Diagnosis not present

## 2022-10-24 DIAGNOSIS — I429 Cardiomyopathy, unspecified: Secondary | ICD-10-CM | POA: Diagnosis not present

## 2022-10-24 LAB — ECHOCARDIOGRAM COMPLETE
Calc EF: 50.4 %
Est EF: 40
S' Lateral: 3.3 cm
Single Plane A2C EF: 52.4 %
Single Plane A4C EF: 49 %

## 2022-10-24 LAB — BASIC METABOLIC PANEL
Anion gap: 10 (ref 5–15)
BUN: 26 mg/dL — ABNORMAL HIGH (ref 8–23)
CO2: 29 mmol/L (ref 22–32)
Calcium: 8.7 mg/dL — ABNORMAL LOW (ref 8.9–10.3)
Chloride: 101 mmol/L (ref 98–111)
Creatinine, Ser: 1.22 mg/dL (ref 0.61–1.24)
GFR, Estimated: >60 mL/min (ref >60)
Glucose, Bld: 96 mg/dL (ref 70–99)
Potassium: 3.7 mmol/L (ref 3.5–5.1)
Sodium: 140 mmol/L (ref 135–145)

## 2022-10-24 MED ORDER — PERFLUTREN LIPID MICROSPHERE
1.0000 mL | INTRAVENOUS | Status: AC | PRN
  Administered 2022-10-24: 4 mL via INTRAVENOUS

## 2022-10-24 NOTE — Telephone Encounter (Signed)
Received message from patient that he lost one of his pill bottles and needed help identifying the medication. Called patient back. Pill is a small circle with the imprint [id number]. Identified pill as losartan 25 mg. Informed patient and he will call his pharmacy for a refill.   Audry Riles, PharmD, BCPS, BCCP, CPP Heart Failure Clinic Pharmacist [phone]

## 2022-10-24 NOTE — Progress Notes (Signed)
Echocardiogram 2D Echocardiogram has been performed.  Fidel Levy 10/24/2022, 3:58 PM

## 2022-10-25 ENCOUNTER — Other Ambulatory Visit: Payer: Self-pay | Admitting: Internal Medicine

## 2022-10-25 LAB — CUP PACEART REMOTE DEVICE CHECK
Battery Remaining Longevity: 23 mo
Battery Voltage: 2.92 V
Brady Statistic AP VP Percent: 0 %
Brady Statistic AP VS Percent: 0 %
Brady Statistic AS VP Percent: 0 %
Brady Statistic AS VS Percent: 0 %
Brady Statistic RA Percent Paced: 0 %
Brady Statistic RV Percent Paced: 99.86 %
Date Time Interrogation Session: 20240110222310
HighPow Impedance: 85 Ohm
Implantable Lead Connection Status: 753985
Implantable Lead Connection Status: 753985
Implantable Lead Implant Date: 20181205
Implantable Lead Implant Date: 20181205
Implantable Lead Location: 753858
Implantable Lead Location: 753860
Implantable Lead Model: 4398
Implantable Pulse Generator Implant Date: 20181205
Lead Channel Impedance Value: 1064 Ohm
Lead Channel Impedance Value: 1121 Ohm
Lead Channel Impedance Value: 1140 Ohm
Lead Channel Impedance Value: 1178 Ohm
Lead Channel Impedance Value: 256.5 Ohm
Lead Channel Impedance Value: 299.908
Lead Channel Impedance Value: 299.908
Lead Channel Impedance Value: 309.309
Lead Channel Impedance Value: 309.309
Lead Channel Impedance Value: 361 Ohm
Lead Channel Impedance Value: 399 Ohm
Lead Channel Impedance Value: 4047 Ohm
Lead Channel Impedance Value: 513 Ohm
Lead Channel Impedance Value: 513 Ohm
Lead Channel Impedance Value: 722 Ohm
Lead Channel Impedance Value: 779 Ohm
Lead Channel Impedance Value: 817 Ohm
Lead Channel Impedance Value: 931 Ohm
Lead Channel Pacing Threshold Amplitude: 0.5 V
Lead Channel Pacing Threshold Pulse Width: 0.4 ms
Lead Channel Sensing Intrinsic Amplitude: 11.125 mV
Lead Channel Sensing Intrinsic Amplitude: 11.125 mV
Lead Channel Setting Pacing Amplitude: 2.25 V
Lead Channel Setting Pacing Amplitude: 2.5 V
Lead Channel Setting Pacing Pulse Width: 0.4 ms
Lead Channel Setting Pacing Pulse Width: 0.6 ms
Lead Channel Setting Sensing Sensitivity: 0.3 mV
Zone Setting Status: 755011

## 2022-10-26 ENCOUNTER — Other Ambulatory Visit (HOSPITAL_COMMUNITY): Payer: Self-pay

## 2022-10-31 ENCOUNTER — Encounter: Payer: Self-pay | Admitting: Internal Medicine

## 2022-10-31 ENCOUNTER — Other Ambulatory Visit: Payer: Self-pay

## 2022-10-31 ENCOUNTER — Encounter: Payer: Self-pay | Admitting: Family Medicine

## 2022-10-31 ENCOUNTER — Other Ambulatory Visit: Payer: Self-pay | Admitting: Family Medicine

## 2022-10-31 ENCOUNTER — Other Ambulatory Visit: Payer: Self-pay | Admitting: Internal Medicine

## 2022-10-31 DIAGNOSIS — I4819 Other persistent atrial fibrillation: Secondary | ICD-10-CM

## 2022-10-31 DIAGNOSIS — I255 Ischemic cardiomyopathy: Secondary | ICD-10-CM

## 2022-10-31 DIAGNOSIS — I428 Other cardiomyopathies: Secondary | ICD-10-CM

## 2022-10-31 MED ORDER — PREDNISONE 20 MG PO TABS: 20.0000 mg | ORAL_TABLET | Freq: Every day | ORAL | 0 refills | Status: DC

## 2022-11-01 ENCOUNTER — Encounter (HOSPITAL_BASED_OUTPATIENT_CLINIC_OR_DEPARTMENT_OTHER): Payer: Medicare HMO | Admitting: Internal Medicine

## 2022-11-05 ENCOUNTER — Encounter: Payer: Medicare HMO | Admitting: Internal Medicine

## 2022-11-08 ENCOUNTER — Other Ambulatory Visit: Payer: Self-pay | Admitting: Family Medicine

## 2022-11-11 ENCOUNTER — Encounter: Payer: Self-pay | Admitting: Family Medicine

## 2022-11-12 ENCOUNTER — Ambulatory Visit: Payer: Medicare HMO | Attending: Internal Medicine

## 2022-11-12 ENCOUNTER — Other Ambulatory Visit: Payer: Self-pay

## 2022-11-12 ENCOUNTER — Encounter (HOSPITAL_COMMUNITY): Payer: Self-pay | Admitting: Cardiology

## 2022-11-12 ENCOUNTER — Ambulatory Visit (HOSPITAL_COMMUNITY): Payer: Medicare HMO

## 2022-11-12 DIAGNOSIS — Z95810 Presence of automatic (implantable) cardiac defibrillator: Secondary | ICD-10-CM | POA: Diagnosis not present

## 2022-11-12 DIAGNOSIS — M79604 Pain in right leg: Secondary | ICD-10-CM

## 2022-11-12 DIAGNOSIS — I5022 Chronic systolic (congestive) heart failure: Secondary | ICD-10-CM | POA: Diagnosis not present

## 2022-11-12 MED ORDER — PREDNISONE 20 MG PO TABS: 20.0000 mg | ORAL_TABLET | Freq: Every day | ORAL | 0 refills | Status: DC

## 2022-11-13 MED ORDER — DOXYCYCLINE HYCLATE 50 MG PO CAPS: 100.0000 mg | ORAL_CAPSULE | Freq: Two times a day (BID) | ORAL | 0 refills | Status: DC

## 2022-11-14 ENCOUNTER — Telehealth (INDEPENDENT_AMBULATORY_CARE_PROVIDER_SITE_OTHER): Payer: Medicare HMO | Admitting: Internal Medicine

## 2022-11-14 ENCOUNTER — Encounter: Payer: Self-pay | Admitting: Internal Medicine

## 2022-11-14 DIAGNOSIS — L97909 Non-pressure chronic ulcer of unspecified part of unspecified lower leg with unspecified severity: Secondary | ICD-10-CM

## 2022-11-14 DIAGNOSIS — E11622 Type 2 diabetes mellitus with other skin ulcer: Secondary | ICD-10-CM

## 2022-11-14 DIAGNOSIS — E1165 Type 2 diabetes mellitus with hyperglycemia: Secondary | ICD-10-CM | POA: Diagnosis not present

## 2022-11-14 DIAGNOSIS — E559 Vitamin D deficiency, unspecified: Secondary | ICD-10-CM

## 2022-11-14 DIAGNOSIS — L03115 Cellulitis of right lower limb: Secondary | ICD-10-CM | POA: Diagnosis not present

## 2022-11-14 MED ORDER — CLINDAMYCIN HCL 300 MG PO CAPS: 300.0000 mg | ORAL_CAPSULE | Freq: Three times a day (TID) | ORAL | 0 refills | Status: DC

## 2022-11-14 NOTE — Patient Instructions (Signed)
Please take all new medication as prescribed 

## 2022-11-14 NOTE — Assessment & Plan Note (Signed)
Last vitamin D Lab Results  Component Value Date   VD25OH 34.25 12/20/2021   Low, reminded to start oral replacement

## 2022-11-14 NOTE — Assessment & Plan Note (Signed)
Lab Results  Component Value Date   HGBA1C 5.4 12/20/2021   Stable, pt to continue current medical treatment farxiga 10

## 2022-11-14 NOTE — Assessment & Plan Note (Signed)
Mild to mod, for antibx course cleocin 300 tid x 10 days, f/u r/o DVT with venoous doppler as scheduled,  to f/u any worsening symptoms or concerns

## 2022-11-14 NOTE — Assessment & Plan Note (Signed)
Continue wound management

## 2022-11-14 NOTE — Progress Notes (Signed)
Patient ID: [name], male   DOB: [birth year], 67 y.o.   MRN: [id number]  Virtual Visit via Video Note  I connected with [name] on 11/14/22 at  3:20 PM EST by a video enabled telemedicine application and verified that I am speaking with the correct person using two identifiers.  Location of all participants today Patient: at home Provider: at office   I discussed the limitations of evaluation and management by telemedicine and the availability of in person appointments. The patient expressed understanding and agreed to proceed.  History of Present Illness: Here with c/o 2-3 days onset red, tender, swelling near a very small distal RLE wound extending from the ankle to below the knee, but no fever, chills, red streaks or drainage.  Pt denies chest pain, increased sob or doe, wheezing, orthopnea, PND, increased LE swelling, palpitations, dizziness or syncope.    Pt denies fever, wt loss, night sweats, loss of appetite, or other constitutional symptoms  Has already been scheduled for venous doppler r/o dvt in am.   Past Medical History:  Diagnosis Date   AICD (automatic cardioverter/defibrillator) present    Anemia    supposed to be taking Vit B but doesn't   ANXIETY    takes Xanax nightly   Arthritis    Asthma    Albuterol prn and Advair daily;also takes Prednisone daily   Atrial fibrillation ([place]) 09/06/2015   Cardiomyopathy ([place])    a. EF 25% TEE July 2013; b. EF normalized 2015;  c. 03/2015 Echo: EF 40-45%, difrf HK, PASP 38 mmHg, Mild MR, sev LAE/RAE.   CHF (congestive heart failure) (HCC)    Chronic constipation    takes OTC stool softener   COPD (chronic obstructive pulmonary disease) (HCC)    "one dr says COPD; one dr says emphysema" (09/18/2017)   DEPRESSION    takes Zoloft and Doxepin daily   Diverticulitis    DYSKINESIA, ESOPHAGUS    Dysrhythmia    Atrial Fibrilation   Emphysema of lung [facility])    "one dr says COPD; one dr says emphysema" (09/18/2017)   Essential  hypertension        FIBROMYALGIA    GERD (gastroesophageal reflux disease)        Glaucoma    HYPERLIPIDEMIA    a. Intolerant to statins.   INSOMNIA    takes Ambien nightly   Myocardial infarction [facility])    a. 2012 Myoview notable for prior infarct;  b. 03/2015 Lexiscan CL: EF 37%, diff HK, small area of inferior infarct from apex to base-->Med Rx.   Myocardial infarction ([place])    O2 dependent    "2.5L q hs & prn" (09/18/2017)   Paroxysmal atrial fibrillation ([place])    a. CHA2DS2VASc = 3--> takes Coumadin;  b. 03/15/2015 Successful TEE/DCCV;  c. 03/2015 recurrent afib, Amio d/c'd in setting of hyperthyroidism.   Peripheral neuropathy    Pneumonia 12/2016   Rash and other nonspecific skin eruption 04/12/2009   no cause found saw dermatologists x 2 and allergist   SLEEP APNEA, OBSTRUCTIVE    a. doesn't use CPAP   Syncope    a. 03/2015 s/p MDT LINQ.   Type II diabetes mellitus ([place])        Past Surgical History:  Procedure Laterality Date   ACNE CYST REMOVAL     2 on back    AV NODE ABLATION N/A 10/25/2017   Procedure: AV NODE ABLATION;  Surgeon: Deboraha Sprang, MD;  Location: [place] [facility];  Service: Cardiovascular;  Laterality: N/A;   BIV ICD INSERTION CRT-D N/A 09/18/2017   Procedure: BIV ICD INSERTION CRT-D;  Surgeon: Deboraha Sprang, MD;  Location: [place] [facility];  Service: Cardiovascular;  Laterality: N/A;   CARDIAC CATHETERIZATION N/A 03/21/2016   Procedure: Right/Left Heart Cath and Coronary Angiography;  Surgeon: Larey Dresser, MD;  Location: [place] [facility];  Service: Cardiovascular;  Laterality: N/A;   CARDIOVERSION  04/18/2012   Procedure: CARDIOVERSION;  Surgeon: Fay Records, MD;  Location: [place];  Service: Cardiovascular;  Laterality: N/A;   CARDIOVERSION  04/25/2012   Procedure: CARDIOVERSION;  Surgeon: Thayer Headings, MD;  Location: [place];  Service: Cardiovascular;  Laterality: N/A;   CARDIOVERSION  04/25/2012   Procedure: CARDIOVERSION;  Surgeon:  Fay Records, MD;  Location: [place];  Service: Cardiovascular;  Laterality: N/A;   CARDIOVERSION  05/09/2012   Procedure: CARDIOVERSION;  Surgeon: Sherren Mocha, MD;  Location: [place];  Service: Cardiovascular;  Laterality: N/A;  changed from crenshaw to cooper by trish/leone-endo   CARDIOVERSION N/A 03/15/2015   Procedure: CARDIOVERSION;  Surgeon: Thayer Headings, MD;  Location: [place];  Service: Cardiovascular;  Laterality: N/A;   COLONOSCOPY     COLONOSCOPY WITH PROPOFOL N/A 10/21/2014   Procedure: COLONOSCOPY WITH PROPOFOL;  Surgeon: Ladene Artist, MD;  Location: [facility];  Service: Endoscopy;  Laterality: N/A;   EP IMPLANTABLE DEVICE N/A 04/06/2015   Procedure: Loop Recorder Insertion;  Surgeon: Evans Lance, MD;  Location: [place] [facility];  Service: Cardiovascular;  Laterality: N/A;   ESOPHAGOGASTRODUODENOSCOPY     JOINT REPLACEMENT     LOOP RECORDER REMOVAL N/A 09/18/2017   Procedure: LOOP RECORDER REMOVAL;  Surgeon: Deboraha Sprang, MD;  Location: [place] [facility];  Service: Cardiovascular;  Laterality: N/A;   RIGHT/LEFT HEART CATH AND CORONARY ANGIOGRAPHY N/A 01/28/2017   Procedure: Right/Left Heart Cath and Coronary Angiography;  Surgeon: Larey Dresser, MD;  Location: [place] [facility];  Service: Cardiovascular;  Laterality: N/A;   TEE WITHOUT CARDIOVERSION  04/25/2012   Procedure: TRANSESOPHAGEAL ECHOCARDIOGRAM (TEE);  Surgeon: Thayer Headings, MD;  Location: [place];  Service: Cardiovascular;  Laterality: N/A;   TEE WITHOUT CARDIOVERSION N/A 03/15/2015   Procedure: TRANSESOPHAGEAL ECHOCARDIOGRAM (TEE);  Surgeon: Thayer Headings, MD;  Location: [place];  Service: Cardiovascular;  Laterality: N/A;   TONSILLECTOMY AND ADENOIDECTOMY     TOTAL KNEE ARTHROPLASTY Right 06/15/2014   Procedure: TOTAL KNEE ARTHROPLASTY;  Surgeon: Renette Butters, MD;  Location: [place];  Service: Orthopedics;  Laterality: Right;   TOTAL KNEE ARTHROPLASTY Left 10/17/2021   Procedure: Left  TOTAL KNEE ARTHROPLASTY;  Surgeon: Mcarthur Rossetti, MD;  Location: [place];  Service: Orthopedics;  Laterality: Left;    reports that he quit smoking about 15 years ago. His smoking use included cigarettes. He has a 60.00 pack-year smoking history. He has never used smokeless tobacco. He reports that he does not drink alcohol and does not use drugs. family history includes Allergies in his mother; Asthma in his maternal grandmother and mother; COPD in his mother; Colon polyps in his mother; Hypothyroidism in his mother. Allergies  Allergen Reactions   Amiodarone Other (See Comments)    hyperthyroidism   Statins Other (See Comments)    myalgia   Tape Other (See Comments)    Skin Tears Use Paper Tape Only   Current Outpatient Medications on File Prior to Visit  Medication Sig Dispense Refill  acetaminophen (TYLENOL) 500 MG tablet Take 500 mg by mouth every 6 (six) hours as needed for moderate pain.     albuterol (VENTOLIN HFA) 108 (90 Base) MCG/ACT inhaler INHALE 2 PUFFS BY MOUTH 4 TIMES A DAY AS NEEDED 54 each 3   ALPRAZolam (XANAX) 0.5 MG tablet Take 2 tablets (1 mg total) by mouth at bedtime. 60 tablet 5   augmented betamethasone dipropionate (DIPROLENE-AF) 0.05 % cream Apply 1 application topically daily as needed (itching).     carisoprodol (SOMA) 350 MG tablet TAKE 1 TABLET BY MOUTH THREE TIMES A DAY AS NEEDED FOR MUSCLE SPASM 90 tablet 2   carvedilol (COREG) 6.25 MG tablet TAKE 1 TABLET BY MOUTH TWICE A DAY 180 tablet 3   Cholecalciferol 100 MCG (4000 UT) CAPS 1 tab by mouth once daily 30 capsule 99   dapagliflozin propanediol (FARXIGA) 10 MG TABS tablet Take 1 tablet (10 mg total) by mouth daily before breakfast. 90 tablet 3   doxepin (SINEQUAN) 10 MG capsule TAKE 2 CAPSULES (20 MG TOTAL) BY MOUTH AT BEDTIME AS NEEDED. 180 capsule 1   doxycycline (VIBRAMYCIN) 50 MG capsule Take 2 capsules (100 mg total) by mouth 2 (two) times daily for 7 days. 28 capsule 0   Dupilumab (DUPIXENT)  300 MG/2ML SOPN Inject 300 mg into the skin every 14 (fourteen) days.     ELIQUIS 5 MG TABS tablet TAKE 1 TABLET BY MOUTH TWICE A DAY 60 tablet 11   eplerenone (INSPRA) 50 MG tablet Take 1 tablet (50 mg total) by mouth daily. 90 tablet 3   Evolocumab (REPATHA SURECLICK) 038 MG/ML SOAJ INJECT 1 PEN INTO THE SKIN EVERY 14 (FOURTEEN) DAYS. 6 mL 3   fluticasone (FLONASE) 50 MCG/ACT nasal spray PLACE 2 SPRAYS INTO BOTH NOSTRILS DAILY AS NEEDED FOR ALLERGIES OR RHINITIS. 48 mL 1   Fluticasone-Umeclidin-Vilant (TRELEGY ELLIPTA) 100-62.5-25 MCG/ACT AEPB Inhale 1 puff into the lungs daily. 180 each 2   hydrOXYzine (ATARAX/VISTARIL) 10 MG tablet TAKE 1 TABLET BY MOUTH 3 TIMES DAILY AS NEEDED FOR ITCHING. 60 tablet 2   ipratropium-albuterol (DUONEB) 0.5-2.5 (3) MG/3ML SOLN TAKE 3 ML BY NEBULIZATION EVERY 4 HOURS AS NEEDED (WHEEZING). 360 mL 0   ketoconazole (NIZORAL) 2 % cream Apply 1 Application topically daily. 120 g 1   KLOR-CON M20 20 MEQ tablet TAKE 1 TABLET BY MOUTH EVERY DAY 90 tablet 3   lidocaine (LIDODERM) 5 % APPLY 1 PATCH TO AFFECTED AREA FOR UP TO 12 HOURS. DO NOT USE MORE THAN ONE PATCH IN 24 HOURS. 90 patch 2   losartan (COZAAR) 25 MG tablet Take 1 tablet (25 mg total) by mouth daily. 30 tablet 11   metolazone (ZAROXOLYN) 2.5 MG tablet Take 1 tablet (2.5 mg total) by mouth as directed. With additional 40 meq of potassium 5 tablet 0   omeprazole (PRILOSEC) 20 MG capsule TAKE 1 CAPSULE BY MOUTH TWICE A DAY 180 capsule 1   potassium chloride SA (KLOR-CON M) 20 MEQ tablet Take 2 tablets (40 mEq total) by mouth daily. 180 tablet 3   predniSONE (DELTASONE) 20 MG tablet Take 1 tablet (20 mg total) by mouth daily with breakfast. 10 tablet 0   Respiratory Therapy Supplies (FULL KIT NEBULIZER SET) MISC 1 each by Does not apply route as needed. 1 each 0   silver sulfADIAZINE (SILVADENE) 1 % cream APPLY TOPICALLY TO AFFECTED AREA EVERY DAY 50 g 3   tamsulosin (FLOMAX) 0.4 MG CAPS capsule TAKE 1 CAPSULE BY  MOUTH EVERY  DAY 90 capsule 3   torsemide (DEMADEX) 20 MG tablet Take 4 tablets (80 mg total) by mouth 2 (two) times daily. 540 tablet 3   traMADol (ULTRAM) 50 MG tablet TAKE 1 TABLET BY MOUTH EVERY DAY AS NEEDED 30 tablet 2   traZODone (DESYREL) 100 MG tablet TAKE 2 TABLETS BY MOUTH AT BEDTIME AS NEEDED 180 tablet 1   TUMS 500 MG chewable tablet Chew 500-2,000 mg by mouth every 4 (four) hours as needed for indigestion or heartburn.      venlafaxine XR (EFFEXOR XR) 150 MG 24 hr capsule Take 1 capsule (150 mg total) by mouth daily with breakfast. 90 capsule 3   zolpidem (AMBIEN) 10 MG tablet TAKE 1 TABLET BY MOUTH EVERY DAY AT BEDTIME AS NEEDED 90 tablet 1   No current facility-administered medications on file prior to visit.    Observations/Objective: Alert, NAD, appropriate mood and affect, resps normal, cn 2-12 intact, moves all 4s, no visible rash or swelling Lab Results  Component Value Date   WBC 8.9 10/05/2022   HGB 13.1 10/05/2022   HCT 40.5 10/05/2022   PLT 239 10/05/2022   GLUCOSE 96 10/24/2022   CHOL 99 12/20/2021   TRIG 132.0 12/20/2021   HDL 45.20 12/20/2021   LDLDIRECT 72.0 02/28/2021   LDLCALC 28 12/20/2021   ALT 15 12/20/2021   AST 17 12/20/2021   NA 140 10/24/2022   K 3.7 10/24/2022   CL 101 10/24/2022   CREATININE 1.22 10/24/2022   BUN 26 (H) 10/24/2022   CO2 29 10/24/2022   TSH 1.09 12/20/2021   PSA 2.62 12/20/2021   INR 1.1 05/08/2021   HGBA1C 5.4 12/20/2021   MICROALBUR <0.7 12/20/2021   Assessment and Plan: See notes  Follow Up Instructions: See notes   I discussed the assessment and treatment plan with the patient. The patient was provided an opportunity to ask questions and all were answered. The patient agreed with the plan and demonstrated an understanding of the instructions.   The patient was advised to call back or seek an in-person evaluation if the symptoms worsen or if the condition fails to improve as anticipated.  [name], MD

## 2022-11-15 NOTE — Progress Notes (Signed)
Remote ICD transmission.   

## 2022-11-16 ENCOUNTER — Ambulatory Visit (HOSPITAL_COMMUNITY)
Admission: RE | Admit: 2022-11-16 | Discharge: 2022-11-16 | Disposition: A | Discharge status: Home / Self Care | Payer: Medicare HMO | Source: Ambulatory Visit | Attending: Family Medicine | Admitting: Family Medicine

## 2022-11-16 ENCOUNTER — Telehealth: Payer: Self-pay

## 2022-11-16 DIAGNOSIS — M79604 Pain in right leg: Secondary | ICD-10-CM

## 2022-11-16 NOTE — Telephone Encounter (Signed)
Remote ICM transmission received.  Attempted call to patient regarding ICM remote transmission and left detailed message per DPR.  Advised to return call for any fluid symptoms or questions. Next ICM remote transmission scheduled 12/17/2022.    

## 2022-11-16 NOTE — Progress Notes (Signed)
EPIC Encounter for ICM Monitoring  Patient Name: [name] is a 67 y.o. male Date: 11/16/2022 Primary Care Physican: Troy Sine, MD Primary Cardiologist: [name] Electrophysiologist: Vergie Living Pacing: 99.7%    10/02/2022 Weight: 265 lbs     Attempted call to patient and unable to reach.  Left detailed message per DPR regarding transmission. Transmission reviewed.    Optivol thoracic impedance suggesting    Prescribed:  Torsemide 20 mg 4 tablet(s) (80 mg total) by mouth twice a day.   Potassium 20 mEq take 1 tablets (20 mEq total) daily Metolazone 2.5 mg take 1 tablet by mouth as directed   Labs: 10/24/2022 Creatinine 1.22, BUN 26, Potassium 3.7, Sodium 140, GFR >60 10/05/2022 Creatinine 1.28, BUN 25, Potassium 3.9, Sodium 138, GFR >60 03/30/2022 Creatinine 1.37, BUN 29, Potassium 44, Sodium 140 03/20/2022 Creatinine 1.51, BUN 36, Potassium 4.5, Sodium 135  12/20/2021 Creatinine 1.00, BUN 27, Potassium 4.3, Sodium 140, GFR 78.15 12/15/2021 Creatinine 1.05, BUN 23, Potassium 4.5, Sodium 137, GFR >60 A complete set of results can be found in Results Review.   Recommendations:  Left voice mail with ICM number and encouraged to call if experiencing any fluid symptoms.   Follow-up plan: ICM clinic phone appointment on 12/17/2022.   91 day device clinic remote transmission 01/23/2023.   EP/Cardiology Office Visits:    12/03/2022 with Dr [name].   Last HF clinic appointment was 03/20/2022, overdue to schedule appointment.    Copy of ICM check sent to Dr. [name].     3 month ICM trend: 11/13/2022.    12-14 Month ICM trend:     [name], RN 11/16/2022 9:54 AM

## 2022-11-20 ENCOUNTER — Emergency Department (HOSPITAL_BASED_OUTPATIENT_CLINIC_OR_DEPARTMENT_OTHER): Payer: Medicare HMO

## 2022-11-20 ENCOUNTER — Encounter (HOSPITAL_COMMUNITY): Payer: Self-pay

## 2022-11-20 ENCOUNTER — Encounter (HOSPITAL_BASED_OUTPATIENT_CLINIC_OR_DEPARTMENT_OTHER): Payer: Self-pay

## 2022-11-20 ENCOUNTER — Ambulatory Visit (HOSPITAL_COMMUNITY): Payer: Medicare HMO

## 2022-11-20 ENCOUNTER — Inpatient Hospital Stay (HOSPITAL_BASED_OUTPATIENT_CLINIC_OR_DEPARTMENT_OTHER)
Admission: EM | Admit: 2022-11-20 | Discharge: 2022-11-24 | DRG: 603 | Disposition: A | Discharge status: Home / Self Care | Payer: Medicare HMO | Attending: Internal Medicine | Admitting: Internal Medicine

## 2022-11-20 ENCOUNTER — Telehealth (HOSPITAL_COMMUNITY): Payer: Self-pay

## 2022-11-20 ENCOUNTER — Other Ambulatory Visit: Payer: Self-pay

## 2022-11-20 DIAGNOSIS — I5022 Chronic systolic (congestive) heart failure: Secondary | ICD-10-CM | POA: Diagnosis not present

## 2022-11-20 DIAGNOSIS — F419 Anxiety disorder, unspecified: Secondary | ICD-10-CM | POA: Diagnosis present

## 2022-11-20 DIAGNOSIS — E662 Morbid (severe) obesity with alveolar hypoventilation: Secondary | ICD-10-CM | POA: Diagnosis present

## 2022-11-20 DIAGNOSIS — I5020 Unspecified systolic (congestive) heart failure: Secondary | ICD-10-CM | POA: Diagnosis present

## 2022-11-20 DIAGNOSIS — N182 Chronic kidney disease, stage 2 (mild): Secondary | ICD-10-CM | POA: Diagnosis present

## 2022-11-20 DIAGNOSIS — I4821 Permanent atrial fibrillation: Secondary | ICD-10-CM | POA: Diagnosis present

## 2022-11-20 DIAGNOSIS — F32A Depression, unspecified: Secondary | ICD-10-CM | POA: Diagnosis not present

## 2022-11-20 DIAGNOSIS — E1122 Type 2 diabetes mellitus with diabetic chronic kidney disease: Secondary | ICD-10-CM | POA: Diagnosis present

## 2022-11-20 DIAGNOSIS — E8809 Other disorders of plasma-protein metabolism, not elsewhere classified: Secondary | ICD-10-CM | POA: Diagnosis not present

## 2022-11-20 DIAGNOSIS — I130 Hypertensive heart and chronic kidney disease with heart failure and stage 1 through stage 4 chronic kidney disease, or unspecified chronic kidney disease: Secondary | ICD-10-CM | POA: Diagnosis not present

## 2022-11-20 DIAGNOSIS — Z6839 Body mass index (BMI) 39.0-39.9, adult: Secondary | ICD-10-CM

## 2022-11-20 DIAGNOSIS — N400 Enlarged prostate without lower urinary tract symptoms: Secondary | ICD-10-CM | POA: Diagnosis present

## 2022-11-20 DIAGNOSIS — Z79899 Other long term (current) drug therapy: Secondary | ICD-10-CM | POA: Diagnosis not present

## 2022-11-20 DIAGNOSIS — Z95810 Presence of automatic (implantable) cardiac defibrillator: Secondary | ICD-10-CM | POA: Diagnosis not present

## 2022-11-20 DIAGNOSIS — Z825 Family history of asthma and other chronic lower respiratory diseases: Secondary | ICD-10-CM

## 2022-11-20 DIAGNOSIS — N179 Acute kidney failure, unspecified: Secondary | ICD-10-CM | POA: Diagnosis not present

## 2022-11-20 DIAGNOSIS — K219 Gastro-esophageal reflux disease without esophagitis: Secondary | ICD-10-CM | POA: Diagnosis present

## 2022-11-20 DIAGNOSIS — I442 Atrioventricular block, complete: Secondary | ICD-10-CM | POA: Diagnosis not present

## 2022-11-20 DIAGNOSIS — M797 Fibromyalgia: Secondary | ICD-10-CM | POA: Diagnosis not present

## 2022-11-20 DIAGNOSIS — E1142 Type 2 diabetes mellitus with diabetic polyneuropathy: Secondary | ICD-10-CM | POA: Diagnosis present

## 2022-11-20 DIAGNOSIS — N189 Chronic kidney disease, unspecified: Secondary | ICD-10-CM | POA: Diagnosis not present

## 2022-11-20 DIAGNOSIS — Z7951 Long term (current) use of inhaled steroids: Secondary | ICD-10-CM

## 2022-11-20 DIAGNOSIS — L03115 Cellulitis of right lower limb: Secondary | ICD-10-CM | POA: Diagnosis present

## 2022-11-20 DIAGNOSIS — Z96653 Presence of artificial knee joint, bilateral: Secondary | ICD-10-CM | POA: Diagnosis present

## 2022-11-20 DIAGNOSIS — L03116 Cellulitis of left lower limb: Secondary | ICD-10-CM | POA: Diagnosis present

## 2022-11-20 DIAGNOSIS — I5084 End stage heart failure: Secondary | ICD-10-CM | POA: Diagnosis present

## 2022-11-20 DIAGNOSIS — L02419 Cutaneous abscess of limb, unspecified: Secondary | ICD-10-CM | POA: Diagnosis present

## 2022-11-20 DIAGNOSIS — Z86718 Personal history of other venous thrombosis and embolism: Secondary | ICD-10-CM

## 2022-11-20 DIAGNOSIS — R0602 Shortness of breath: Secondary | ICD-10-CM | POA: Diagnosis not present

## 2022-11-20 DIAGNOSIS — G4733 Obstructive sleep apnea (adult) (pediatric): Secondary | ICD-10-CM | POA: Diagnosis present

## 2022-11-20 DIAGNOSIS — E876 Hypokalemia: Secondary | ICD-10-CM | POA: Diagnosis not present

## 2022-11-20 DIAGNOSIS — Z87891 Personal history of nicotine dependence: Secondary | ICD-10-CM | POA: Diagnosis not present

## 2022-11-20 DIAGNOSIS — M8448XG Pathological fracture, other site, subsequent encounter for fracture with delayed healing: Secondary | ICD-10-CM | POA: Diagnosis present

## 2022-11-20 DIAGNOSIS — I429 Cardiomyopathy, unspecified: Secondary | ICD-10-CM | POA: Diagnosis present

## 2022-11-20 DIAGNOSIS — Z9981 Dependence on supplemental oxygen: Secondary | ICD-10-CM

## 2022-11-20 DIAGNOSIS — H409 Unspecified glaucoma: Secondary | ICD-10-CM | POA: Diagnosis present

## 2022-11-20 DIAGNOSIS — Z7901 Long term (current) use of anticoagulants: Secondary | ICD-10-CM

## 2022-11-20 DIAGNOSIS — Z83719 Family history of colon polyps, unspecified: Secondary | ICD-10-CM

## 2022-11-20 DIAGNOSIS — E785 Hyperlipidemia, unspecified: Secondary | ICD-10-CM | POA: Diagnosis present

## 2022-11-20 DIAGNOSIS — Z888 Allergy status to other drugs, medicaments and biological substances status: Secondary | ICD-10-CM

## 2022-11-20 DIAGNOSIS — I10 Essential (primary) hypertension: Secondary | ICD-10-CM | POA: Diagnosis not present

## 2022-11-20 DIAGNOSIS — S82001A Unspecified fracture of right patella, initial encounter for closed fracture: Secondary | ICD-10-CM | POA: Diagnosis not present

## 2022-11-20 DIAGNOSIS — J438 Other emphysema: Secondary | ICD-10-CM | POA: Diagnosis not present

## 2022-11-20 DIAGNOSIS — M1990 Unspecified osteoarthritis, unspecified site: Secondary | ICD-10-CM | POA: Diagnosis present

## 2022-11-20 DIAGNOSIS — I252 Old myocardial infarction: Secondary | ICD-10-CM

## 2022-11-20 DIAGNOSIS — J449 Chronic obstructive pulmonary disease, unspecified: Secondary | ICD-10-CM | POA: Diagnosis present

## 2022-11-20 DIAGNOSIS — F5105 Insomnia due to other mental disorder: Secondary | ICD-10-CM | POA: Diagnosis present

## 2022-11-20 DIAGNOSIS — J4489 Other specified chronic obstructive pulmonary disease: Secondary | ICD-10-CM | POA: Diagnosis present

## 2022-11-20 LAB — CBC WITH DIFFERENTIAL/PLATELET
Abs Immature Granulocytes: 0.11 10*3/uL — ABNORMAL HIGH (ref 0.00–0.07)
Basophils Absolute: 0 10*3/uL (ref 0.0–0.1)
Basophils Relative: 0 %
Eosinophils Absolute: 0.1 10*3/uL (ref 0.0–0.5)
Eosinophils Relative: 1 %
HCT: 41.2 % (ref 39.0–52.0)
Hemoglobin: 13.1 g/dL (ref 13.0–17.0)
Immature Granulocytes: 1 %
Lymphocytes Relative: 13 %
Lymphs Abs: 1.2 10*3/uL (ref 0.7–4.0)
MCH: 31.8 pg (ref 26.0–34.0)
MCHC: 31.8 g/dL (ref 30.0–36.0)
MCV: 100 fL (ref 80.0–100.0)
Monocytes Absolute: 0.8 10*3/uL (ref 0.1–1.0)
Monocytes Relative: 9 %
Neutro Abs: 6.9 10*3/uL (ref 1.7–7.7)
Neutrophils Relative %: 76 %
Platelets: 236 10*3/uL (ref 150–400)
RBC: 4.12 MIL/uL — ABNORMAL LOW (ref 4.22–5.81)
RDW: 12.5 % (ref 11.5–15.5)
WBC: 9.1 10*3/uL (ref 4.0–10.5)
nRBC: 0 % (ref 0.0–0.2)

## 2022-11-20 LAB — COMPREHENSIVE METABOLIC PANEL
ALT: 48 U/L — ABNORMAL HIGH (ref 0–44)
AST: 26 U/L (ref 15–41)
Albumin: 3.2 g/dL — ABNORMAL LOW (ref 3.5–5.0)
Alkaline Phosphatase: 50 U/L (ref 38–126)
Anion gap: 6 (ref 5–15)
BUN: 26 mg/dL — ABNORMAL HIGH (ref 8–23)
CO2: 22 mmol/L (ref 22–32)
Calcium: 8.5 mg/dL — ABNORMAL LOW (ref 8.9–10.3)
Chloride: 106 mmol/L (ref 98–111)
Creatinine, Ser: 1.07 mg/dL (ref 0.61–1.24)
GFR, Estimated: >60 mL/min (ref >60)
Glucose, Bld: 97 mg/dL (ref 70–99)
Potassium: 4.1 mmol/L (ref 3.5–5.1)
Sodium: 134 mmol/L — ABNORMAL LOW (ref 135–145)
Total Bilirubin: 0.6 mg/dL (ref 0.3–1.2)
Total Protein: 6.7 g/dL (ref 6.5–8.1)

## 2022-11-20 LAB — LACTIC ACID, PLASMA: Lactic Acid, Venous: 1.2 mmol/L (ref 0.5–1.9)

## 2022-11-20 MED ORDER — ZOLPIDEM TARTRATE 10 MG PO TABS
10.0000 mg | ORAL_TABLET | Freq: Every day | ORAL | Status: DC
  Administered 2022-11-20 – 2022-11-23 (×4): 10 mg via ORAL
  Filled 2022-11-20 (×4): qty 1

## 2022-11-20 MED ORDER — LOSARTAN POTASSIUM 50 MG PO TABS: 25.0000 mg | ORAL_TABLET | Freq: Every day | ORAL | Status: DC

## 2022-11-20 MED ORDER — UMECLIDINIUM BROMIDE 62.5 MCG/ACT IN AEPB
1.0000 | INHALATION_SPRAY | Freq: Every day | RESPIRATORY_TRACT | Status: DC
  Administered 2022-11-21 – 2022-11-24 (×4): 1 via RESPIRATORY_TRACT
  Filled 2022-11-20: qty 7

## 2022-11-20 MED ORDER — TRAZODONE HCL 100 MG PO TABS
100.0000 mg | ORAL_TABLET | Freq: Every evening | ORAL | Status: DC | PRN
  Administered 2022-11-21: 100 mg via ORAL
  Filled 2022-11-20: qty 1

## 2022-11-20 MED ORDER — TORSEMIDE 20 MG PO TABS
80.0000 mg | ORAL_TABLET | Freq: Two times a day (BID) | ORAL | Status: DC
  Administered 2022-11-20 – 2022-11-24 (×8): 80 mg via ORAL
  Filled 2022-11-20 (×9): qty 4

## 2022-11-20 MED ORDER — IPRATROPIUM-ALBUTEROL 0.5-2.5 (3) MG/3ML IN SOLN: 3.0000 mL | RESPIRATORY_TRACT | Status: DC | PRN

## 2022-11-20 MED ORDER — LOSARTAN POTASSIUM 25 MG PO TABS
12.5000 mg | ORAL_TABLET | Freq: Every day | ORAL | Status: DC
  Administered 2022-11-20 – 2022-11-23 (×4): 12.5 mg via ORAL
  Filled 2022-11-20 (×4): qty 0.5

## 2022-11-20 MED ORDER — ALBUTEROL SULFATE HFA 108 (90 BASE) MCG/ACT IN AERS: 1.0000 | INHALATION_SPRAY | Freq: Four times a day (QID) | RESPIRATORY_TRACT | Status: DC | PRN

## 2022-11-20 MED ORDER — ALBUTEROL SULFATE (2.5 MG/3ML) 0.083% IN NEBU: 2.5000 mg | INHALATION_SOLUTION | Freq: Four times a day (QID) | RESPIRATORY_TRACT | Status: DC | PRN

## 2022-11-20 MED ORDER — SPIRONOLACTONE 25 MG PO TABS
25.0000 mg | ORAL_TABLET | Freq: Every day | ORAL | Status: DC
  Administered 2022-11-21 – 2022-11-24 (×4): 25 mg via ORAL
  Filled 2022-11-20 (×4): qty 1

## 2022-11-20 MED ORDER — SODIUM CHLORIDE 0.9 % IV SOLN
2.0000 g | INTRAVENOUS | Status: DC
  Administered 2022-11-20 – 2022-11-21 (×2): 2 g via INTRAVENOUS
  Filled 2022-11-20 (×3): qty 20

## 2022-11-20 MED ORDER — DAPAGLIFLOZIN PROPANEDIOL 10 MG PO TABS
10.0000 mg | ORAL_TABLET | Freq: Every day | ORAL | Status: DC
  Administered 2022-11-21 – 2022-11-24 (×4): 10 mg via ORAL
  Filled 2022-11-20 (×4): qty 1

## 2022-11-20 MED ORDER — FLUTICASONE FUROATE-VILANTEROL 100-25 MCG/ACT IN AEPB
1.0000 | INHALATION_SPRAY | Freq: Every day | RESPIRATORY_TRACT | Status: DC
  Administered 2022-11-21 – 2022-11-24 (×4): 1 via RESPIRATORY_TRACT
  Filled 2022-11-20: qty 28

## 2022-11-20 MED ORDER — SENNOSIDES-DOCUSATE SODIUM 8.6-50 MG PO TABS
1.0000 | ORAL_TABLET | Freq: Every evening | ORAL | Status: DC | PRN
  Administered 2022-11-20: 1 via ORAL
  Filled 2022-11-20: qty 1

## 2022-11-20 MED ORDER — ONDANSETRON HCL 4 MG/2ML IJ SOLN: 4.0000 mg | Freq: Four times a day (QID) | INTRAMUSCULAR | Status: DC | PRN

## 2022-11-20 MED ORDER — ONDANSETRON HCL 4 MG PO TABS: 4.0000 mg | ORAL_TABLET | Freq: Four times a day (QID) | ORAL | Status: DC | PRN

## 2022-11-20 MED ORDER — ACETAMINOPHEN 650 MG RE SUPP: 650.0000 mg | Freq: Four times a day (QID) | RECTAL | Status: DC | PRN

## 2022-11-20 MED ORDER — TAMSULOSIN HCL 0.4 MG PO CAPS
0.4000 mg | ORAL_CAPSULE | Freq: Every day | ORAL | Status: DC
  Administered 2022-11-21 – 2022-11-24 (×4): 0.4 mg via ORAL
  Filled 2022-11-20 (×4): qty 1

## 2022-11-20 MED ORDER — ALPRAZOLAM 0.5 MG PO TABS
1.0000 mg | ORAL_TABLET | Freq: Every day | ORAL | Status: DC
  Administered 2022-11-20 – 2022-11-23 (×4): 1 mg via ORAL
  Filled 2022-11-20 (×4): qty 2

## 2022-11-20 MED ORDER — ACETAMINOPHEN 325 MG PO TABS
650.0000 mg | ORAL_TABLET | Freq: Four times a day (QID) | ORAL | Status: DC | PRN
  Administered 2022-11-20 – 2022-11-23 (×4): 650 mg via ORAL
  Filled 2022-11-20 (×4): qty 2

## 2022-11-20 MED ORDER — IPRATROPIUM-ALBUTEROL 0.5-2.5 (3) MG/3ML IN SOLN
3.0000 mL | Freq: Once | RESPIRATORY_TRACT | Status: AC
  Administered 2022-11-20: 3 mL via RESPIRATORY_TRACT
  Filled 2022-11-20: qty 3

## 2022-11-20 MED ORDER — POTASSIUM CHLORIDE CRYS ER 20 MEQ PO TBCR
40.0000 meq | EXTENDED_RELEASE_TABLET | Freq: Every day | ORAL | Status: DC
  Administered 2022-11-21: 40 meq via ORAL
  Filled 2022-11-20 (×2): qty 2

## 2022-11-20 MED ORDER — TRAMADOL HCL 50 MG PO TABS
25.0000 mg | ORAL_TABLET | Freq: Every day | ORAL | Status: DC | PRN
  Administered 2022-11-21 – 2022-11-24 (×4): 25 mg via ORAL
  Filled 2022-11-20 (×5): qty 1

## 2022-11-20 MED ORDER — VENLAFAXINE HCL ER 150 MG PO CP24
150.0000 mg | ORAL_CAPSULE | Freq: Every day | ORAL | Status: DC
  Administered 2022-11-21 – 2022-11-24 (×4): 150 mg via ORAL
  Filled 2022-11-20 (×4): qty 1

## 2022-11-20 MED ORDER — METRONIDAZOLE 500 MG/100ML IV SOLN
500.0000 mg | Freq: Two times a day (BID) | INTRAVENOUS | Status: DC
  Administered 2022-11-20: 500 mg via INTRAVENOUS
  Filled 2022-11-20: qty 100

## 2022-11-20 MED ORDER — CARVEDILOL 6.25 MG PO TABS
6.2500 mg | ORAL_TABLET | Freq: Two times a day (BID) | ORAL | Status: DC
  Administered 2022-11-20 – 2022-11-24 (×8): 6.25 mg via ORAL
  Filled 2022-11-20 (×8): qty 1

## 2022-11-20 MED ORDER — APIXABAN 5 MG PO TABS
5.0000 mg | ORAL_TABLET | Freq: Two times a day (BID) | ORAL | Status: DC
  Administered 2022-11-20 – 2022-11-24 (×8): 5 mg via ORAL
  Filled 2022-11-20 (×8): qty 1

## 2022-11-20 MED ORDER — DOXEPIN HCL 10 MG PO CAPS
20.0000 mg | ORAL_CAPSULE | Freq: Every day | ORAL | Status: DC
  Administered 2022-11-20 – 2022-11-23 (×4): 20 mg via ORAL
  Filled 2022-11-20 (×4): qty 2

## 2022-11-20 NOTE — Telephone Encounter (Signed)
I spoke to patient and he agreed to go the ED today.

## 2022-11-20 NOTE — Assessment & Plan Note (Addendum)
S/p CRT-D.  Overall appears compensated aside from unilateral RLE edema in setting of cellulitis.  Last EF 40% 10/24/2022. -Continue torsemide 80 mg twice daily -Continue home Coreg, eplerenone, losartan, Farxiga -Monitor I/O's and daily weights

## 2022-11-20 NOTE — Assessment & Plan Note (Signed)
Stable without wheezing on admission.  Continue Trelegy Ellipta and albuterol/DuoNeb as needed.

## 2022-11-20 NOTE — ED Notes (Signed)
Pt was able to ambulate to restroom w/ standby assist.

## 2022-11-20 NOTE — ED Notes (Signed)
Report given to Stevan Born, RN for CL.

## 2022-11-20 NOTE — Telephone Encounter (Signed)
Patient called in stated his leg is more swollen and he is having significant pain.  He states very red. It is hurting up above his knee now and goes down into his ankle.  He states  his ankle is hurting worse.  He is having some weeping from the leg as well.  His primary put him on Cleocin so he did not take Doxycycline. Any suggestions at this point for him?

## 2022-11-20 NOTE — H&P (Signed)
History and Physical    MONTERRIO GERST QHU:[id number] DOB: 06-14-1956 DOA: 11/20/2022  PCP: Troy Sine, MD  Patient coming from: Home  I have personally briefly reviewed patient's old medical records in [place]  Chief Complaint: Right leg redness and swelling  HPI: [name] is a 67 y.o. male with medical history significant for permanent A-fib s/p AV nodal ablation on Eliquis, HFrEF (EF 40%) and CHB s/p CRT-D, COPD, HTN, HLD, anxiety, OSA intolerant to CPAP who presented to the ED for evaluation of right leg redness and swelling.  Patient reports about 10 days of increased redness and swelling of his right lower leg from his ankle almost up to his knee.  About 1 week ago he was started on doxycycline by his cardiologist and also had a video visit with his PCP the same day and was started on clindamycin.  He has been taking the clindamycin alone.  He has not seen any significant improvement and therefore went to the ED for further evaluation.  He has noticed some weeping drainage from his right leg but no open wound.  He denies fevers, chills, diaphoresis.  Recent bilateral lower extremity venous Dopplers on 12/15/2022 were negative for evidence of DVT.  [facility] [place] [facility] ED Course  Labs/Imaging on admission: I have personally reviewed following labs and imaging studies.  Initial vitals showed BP 124/80, pulse 70, RR 21, temp 97.7 F, SpO2 97% on room air.  Labs showed WBC 9.1, hemoglobin 13.1, platelets 236,000, sodium 134, potassium 4.1, bicarb 22, BUN 26, creatinine 1.07, serum glucose 97, lactic acid 1.2.  Blood cultures in process.  2 view chest x-ray showed enlarged cardiac silhouette with bibasilar volume loss.  Right tibia/fibula x-ray negative for acute findings.  Chronic patellar fracture with further distraction since previous noted.  Patient was given IV ceftriaxone and Flagyl, DuoNeb treatment.  The hospitalist service was consulted to admit for further  evaluation and management.  Review of Systems: All systems reviewed and are negative except as documented in history of present illness above.   Past Medical History:  Diagnosis Date   AICD (automatic cardioverter/defibrillator) present    Anemia    supposed to be taking Vit B but doesn't   ANXIETY    takes Xanax nightly   Arthritis    Asthma    Albuterol prn and Advair daily;also takes Prednisone daily   Cardiomyopathy ([place])    a. EF 25% TEE July 2013; b. EF normalized 2015;  c. 03/2015 Echo: EF 40-45%, difrf HK, PASP 38 mmHg, Mild MR, sev LAE/RAE.   Chronic constipation    takes OTC stool softener   COPD (chronic obstructive pulmonary disease) (HCC)    "one dr says COPD; one dr says emphysema" (09/18/2017)   DEPRESSION    takes Zoloft and Doxepin daily   Diverticulitis    DYSKINESIA, ESOPHAGUS    Essential hypertension        FIBROMYALGIA    GERD (gastroesophageal reflux disease)        Glaucoma    HYPERLIPIDEMIA    a. Intolerant to statins.   INSOMNIA    takes Ambien nightly   Myocardial infarction [facility])    a. 2012 Myoview notable for prior infarct;  b. 03/2015 Lexiscan CL: EF 37%, diff HK, small area of inferior infarct from apex to base-->Med Rx.   O2 dependent    "2.5L q hs & prn" (09/18/2017)   Paroxysmal atrial fibrillation (HCC)    a. CHA2DS2VASc = 3-->  takes Coumadin;  b. 03/15/2015 Successful TEE/DCCV;  c. 03/2015 recurrent afib, Amio d/c'd in setting of hyperthyroidism.   Peripheral neuropathy    Pneumonia 12/2016   Rash and other nonspecific skin eruption 04/12/2009   no cause found saw dermatologists x 2 and allergist   SLEEP APNEA, OBSTRUCTIVE    a. doesn't use CPAP   Syncope    a. 03/2015 s/p MDT LINQ.   Type II diabetes mellitus ([place])         Past Surgical History:  Procedure Laterality Date   ACNE CYST REMOVAL     2 on back    AV NODE ABLATION N/A 10/25/2017   Procedure: AV NODE ABLATION;  Surgeon: Deboraha Sprang, MD;  Location: [place] [facility];   Service: Cardiovascular;  Laterality: N/A;   BIV ICD INSERTION CRT-D N/A 09/18/2017   Procedure: BIV ICD INSERTION CRT-D;  Surgeon: Deboraha Sprang, MD;  Location: [place] [facility];  Service: Cardiovascular;  Laterality: N/A;   CARDIAC CATHETERIZATION N/A 03/21/2016   Procedure: Right/Left Heart Cath and Coronary Angiography;  Surgeon: Larey Dresser, MD;  Location: [place] [facility];  Service: Cardiovascular;  Laterality: N/A;   CARDIOVERSION  04/18/2012   Procedure: CARDIOVERSION;  Surgeon: Fay Records, MD;  Location: [place];  Service: Cardiovascular;  Laterality: N/A;   CARDIOVERSION  04/25/2012   Procedure: CARDIOVERSION;  Surgeon: Thayer Headings, MD;  Location: [name];  Service: Cardiovascular;  Laterality: N/A;   CARDIOVERSION  04/25/2012   Procedure: CARDIOVERSION;  Surgeon: Fay Records, MD;  Location: [place];  Service: Cardiovascular;  Laterality: N/A;   CARDIOVERSION  05/09/2012   Procedure: CARDIOVERSION;  Surgeon: Sherren Mocha, MD;  Location: [place];  Service: Cardiovascular;  Laterality: N/A;  changed from crenshaw to cooper by trish/leone-endo   CARDIOVERSION N/A 03/15/2015   Procedure: CARDIOVERSION;  Surgeon: Thayer Headings, MD;  Location: [place];  Service: Cardiovascular;  Laterality: N/A;   COLONOSCOPY     COLONOSCOPY WITH PROPOFOL N/A 10/21/2014   Procedure: COLONOSCOPY WITH PROPOFOL;  Surgeon: Ladene Artist, MD;  Location: [facility];  Service: Endoscopy;  Laterality: N/A;   EP IMPLANTABLE DEVICE N/A 04/06/2015   Procedure: Loop Recorder Insertion;  Surgeon: Evans Lance, MD;  Location: [place] [facility];  Service: Cardiovascular;  Laterality: N/A;   ESOPHAGOGASTRODUODENOSCOPY     JOINT REPLACEMENT     LOOP RECORDER REMOVAL N/A 09/18/2017   Procedure: LOOP RECORDER REMOVAL;  Surgeon: Deboraha Sprang, MD;  Location: [place] [facility];  Service: Cardiovascular;  Laterality: N/A;   RIGHT/LEFT HEART CATH AND CORONARY ANGIOGRAPHY N/A 01/28/2017   Procedure:  Right/Left Heart Cath and Coronary Angiography;  Surgeon: Larey Dresser, MD;  Location: [place] [facility];  Service: Cardiovascular;  Laterality: N/A;   TEE WITHOUT CARDIOVERSION  04/25/2012   Procedure: TRANSESOPHAGEAL ECHOCARDIOGRAM (TEE);  Surgeon: Thayer Headings, MD;  Location: [place];  Service: Cardiovascular;  Laterality: N/A;   TEE WITHOUT CARDIOVERSION N/A 03/15/2015   Procedure: TRANSESOPHAGEAL ECHOCARDIOGRAM (TEE);  Surgeon: Thayer Headings, MD;  Location: [place];  Service: Cardiovascular;  Laterality: N/A;   TONSILLECTOMY AND ADENOIDECTOMY     TOTAL KNEE ARTHROPLASTY Right 06/15/2014   Procedure: TOTAL KNEE ARTHROPLASTY;  Surgeon: Renette Butters, MD;  Location: [place];  Service: Orthopedics;  Laterality: Right;   TOTAL KNEE ARTHROPLASTY Left 10/17/2021   Procedure: Left TOTAL KNEE ARTHROPLASTY;  Surgeon: Mcarthur Rossetti, MD;  Location: [place];  Service:  Orthopedics;  Laterality: Left;    Social History:  reports that he quit smoking about 15 years ago. His smoking use included cigarettes. He has a 60.00 pack-year smoking history. He has never used smokeless tobacco. He reports that he does not drink alcohol and does not use drugs.  Allergies  Allergen Reactions   Amiodarone Other (See Comments)    hyperthyroidism   Statins Other (See Comments)    myalgia   Tape Other (See Comments)    Skin Tears Use Paper Tape Only    Family History  Problem Relation Age of Onset   COPD Mother    Asthma Mother    Colon polyps Mother    Allergies Mother    Hypothyroidism Mother    Asthma Maternal Grandmother    Colon cancer Neg Hx      Prior to Admission medications   Medication Sig Start Date End Date Taking? Authorizing Provider  acetaminophen (TYLENOL) 500 MG tablet Take 500 mg by mouth every 6 (six) hours as needed for moderate pain.    [provider]  albuterol (VENTOLIN HFA) 108 (90 Base) MCG/ACT inhaler INHALE 2 PUFFS BY MOUTH 4 TIMES A DAY AS  NEEDED 07/28/22   [name], MD  ALPRAZolam Duanne Moron) 0.5 MG tablet Take 2 tablets (1 mg total) by mouth at bedtime. 07/10/22   [name], MD  augmented betamethasone dipropionate (DIPROLENE-AF) 0.05 % cream Apply 1 application topically daily as needed (itching). 10/11/20   [provider]  carisoprodol (SOMA) 350 MG tablet TAKE 1 TABLET BY MOUTH THREE TIMES A DAY AS NEEDED FOR MUSCLE SPASM 10/31/22   [name], MD  carvedilol (COREG) 6.25 MG tablet TAKE 1 TABLET BY MOUTH TWICE A DAY 09/24/22   Larey Dresser, MD  Cholecalciferol 100 MCG (4000 UT) CAPS 1 tab by mouth once daily 03/18/21   [name], MD  clindamycin (CLEOCIN) 300 MG capsule Take 1 capsule (300 mg total) by mouth 3 (three) times daily. 11/14/22   [name], MD  dapagliflozin propanediol (FARXIGA) 10 MG TABS tablet Take 1 tablet (10 mg total) by mouth daily before breakfast. 12/18/21   Larey Dresser, MD  doxepin (SINEQUAN) 10 MG capsule TAKE 2 CAPSULES (20 MG TOTAL) BY MOUTH AT BEDTIME AS NEEDED. 05/14/22   [name], MD  doxycycline (VIBRAMYCIN) 50 MG capsule Take 2 capsules (100 mg total) by mouth 2 (two) times daily for 7 days. 11/13/22 11/20/22  Rafael Bihari, FNP  Dupilumab (DUPIXENT) 300 MG/2ML SOPN Inject 300 mg into the skin every 14 (fourteen) days.    [provider]  ELIQUIS 5 MG TABS tablet TAKE 1 TABLET BY MOUTH TWICE A DAY 10/11/22   Larey Dresser, MD  eplerenone (INSPRA) 50 MG tablet Take 1 tablet (50 mg total) by mouth daily. 09/15/21   Larey Dresser, MD  Evolocumab (REPATHA SURECLICK) 253 MG/ML SOAJ INJECT 1 PEN INTO THE SKIN EVERY 14 (FOURTEEN) DAYS. 08/22/22   Larey Dresser, MD  fluticasone (FLONASE) 50 MCG/ACT nasal spray PLACE 2 SPRAYS INTO BOTH NOSTRILS DAILY AS NEEDED FOR ALLERGIES OR RHINITIS. 08/30/22   [name], MD  Fluticasone-Umeclidin-Vilant (TRELEGY ELLIPTA) 100-62.5-25 MCG/ACT AEPB Inhale 1 puff into the lungs daily. 10/04/22   Margaretha Seeds, MD   hydrOXYzine (ATARAX/VISTARIL) 10 MG tablet TAKE 1 TABLET BY MOUTH 3 TIMES DAILY AS NEEDED FOR ITCHING. 04/25/21   [name], MD  ipratropium-albuterol (DUONEB) 0.5-2.5 (3) MG/3ML SOLN  TAKE 3 ML BY NEBULIZATION EVERY 4 HOURS AS NEEDED (WHEEZING). 10/16/22   [name], MD  ketoconazole (NIZORAL) 2 % cream Apply 1 Application topically daily. 09/26/22   Plotnikov, Evie Lacks, MD  KLOR-CON M20 20 MEQ tablet TAKE 1 TABLET BY MOUTH EVERY DAY 09/27/22   Larey Dresser, MD  lidocaine (LIDODERM) 5 % APPLY 1 PATCH TO AFFECTED AREA FOR UP TO 12 HOURS. DO NOT USE MORE THAN ONE PATCH IN 24 HOURS. 03/08/22   [name], MD  losartan (COZAAR) 25 MG tablet Take 1 tablet (25 mg total) by mouth daily. 08/30/22   Larey Dresser, MD  metolazone (ZAROXOLYN) 2.5 MG tablet Take 1 tablet (2.5 mg total) by mouth as directed. With additional 40 meq of potassium 05/17/21 03/21/23  [place]ford, Maricela Bo, FNP  omeprazole (PRILOSEC) 20 MG capsule TAKE 1 CAPSULE BY MOUTH TWICE A DAY 05/24/22   [name], MD  potassium chloride SA (KLOR-CON M) 20 MEQ tablet Take 2 tablets (40 mEq total) by mouth daily. 10/05/22   Rafael Bihari, FNP  predniSONE (DELTASONE) 20 MG tablet Take 1 tablet (20 mg total) by mouth daily with breakfast. 11/12/22   Lyndal Pulley, DO  Respiratory Therapy Supplies (FULL KIT NEBULIZER SET) MISC 1 each by Does not apply route as needed. 09/24/22   Bonnita Hollow, MD  silver sulfADIAZINE (SILVADENE) 1 % cream APPLY TOPICALLY TO AFFECTED AREA EVERY DAY 02/19/22   [name], MD  tamsulosin (FLOMAX) 0.4 MG CAPS capsule TAKE 1 CAPSULE BY MOUTH EVERY DAY 03/02/22   [name], MD  torsemide (DEMADEX) 20 MG tablet Take 4 tablets (80 mg total) by mouth 2 (two) times daily. 10/05/22   Rafael Bihari, FNP  traMADol (ULTRAM) 50 MG tablet TAKE 1 TABLET BY MOUTH EVERY DAY AS NEEDED 08/26/22   [name], MD  traZODone (DESYREL) 100 MG tablet TAKE 2 TABLETS BY MOUTH AT BEDTIME AS NEEDED 10/14/22    [name], MD  TUMS 500 MG chewable tablet Chew 500-2,000 mg by mouth every 4 (four) hours as needed for indigestion or heartburn.  10/19/16   [provider]  venlafaxine XR (EFFEXOR XR) 150 MG 24 hr capsule Take 1 capsule (150 mg total) by mouth daily with breakfast. 06/29/22   [name], MD  zolpidem (AMBIEN) 10 MG tablet TAKE 1 TABLET BY MOUTH EVERY DAY AT BEDTIME AS NEEDED 09/13/22   [name], MD    Physical Exam: Vitals:   11/20/22 1415 11/20/22 1500 11/20/22 1530 11/20/22 1802  BP: (!) 128/92 134/82 122/81 (!) 151/84  Pulse: (!) 109 70 70 71  Resp: '15 14  18  '$ Temp:    98.2 F (36.8 C)  TempSrc:    Oral  SpO2: (!) 87% 95% 95% 98%  Weight:      Height:       Constitutional: Sitting up in chair, NAD, calm, comfortable Eyes: EOMI, lids and conjunctivae normal ENMT: Mucous membranes are moist. Posterior pharynx clear of any exudate or lesions.Normal dentition.  Neck: normal, supple, no masses. Respiratory: clear to auscultation bilaterally, no wheezing, no crackles. Normal respiratory effort. No accessory muscle use.  Cardiovascular: Regular rate and rhythm, no murmurs / rubs / gallops.  +1 right lower extremity edema, trace left lower extremity edema.  Abdomen: no tenderness, no masses palpated.  Musculoskeletal: no clubbing / cyanosis. No joint deformity upper and lower extremities. Good ROM, no contractures. Normal muscle tone.  Skin: Increased erythema and swelling of right lower leg as pictured below, no open wound or discharge Neurologic:  Sensation intact. Strength 5/5 in all 4.  Psychiatric: Normal judgment and insight. Alert and oriented x 3. Normal mood.    EKG: Personally reviewed. V paced rhythm.  Previous EKG showed V paced rhythm with A-fib.  Assessment/Plan Principal Problem:   Cellulitis of right lower extremity without foot Active Problems:   HFrEF (heart failure with reduced ejection fraction) (HCC)   COPD (chronic obstructive pulmonary  disease) (HCC)   Permanent atrial fibrillation (HCC)   Essential hypertension   Obstructive sleep apnea   Benign prostatic hyperplasia   Anxiety and depression   ELON EOFF is a 67 y.o. male with medical history significant for permanent A-fib s/p AV nodal ablation on Eliquis, HFrEF (EF 40%) and CHB s/p CRT-D, COPD, HTN, HLD, anxiety, OSA intolerant to CPAP who is admitted with cellulitis of the right lower extremity failing outpatient antibiotics.  Assessment and Plan: * Cellulitis of right lower extremity without foot Failed outpatient antibiotics.  Hemodynamically stable without fever or leukocytosis.  Blood cultures in process.  Lower extremity Dopplers on 2/2 negative for evidence of DVT. -Continue IV ceftriaxone  HFrEF (heart failure with reduced ejection fraction) (HCC) S/p CRT-D.  Overall appears compensated aside from unilateral RLE edema in setting of cellulitis.  Last EF 40% 10/24/2022. -Continue torsemide 80 mg twice daily -Continue home Coreg, eplerenone, losartan, Farxiga -Monitor I/O's and daily weights  Permanent atrial fibrillation (HCC) S/p AV nodal ablation and CRT-D with V-paced rhythm on admit. -Continue Eliquis  COPD (chronic obstructive pulmonary disease) ([place]) Stable without wheezing on admission.  Continue Trelegy Ellipta and albuterol/DuoNeb as needed.  Essential hypertension Continue Coreg, eplerenone, losartan.  Anxiety and depression Continue Effexor and home Xanax.  Benign prostatic hyperplasia Continue Flomax.  Obstructive sleep apnea Intolerant of CPAP.  Use supplemental O2 as needed.  DVT prophylaxis:  apixaban (ELIQUIS) tablet 5 mg   Code Status: Full code, confirmed with patient on admission Family Communication: Discussed with patient, he has discussed with family Disposition Plan: From home and likely discharge to home pending clinical progress Consults called: None Severity of Illness: The appropriate patient status for this  patient is OBSERVATION. Observation status is judged to be reasonable and necessary in order to provide the required intensity of service to ensure the patient's safety. The patient's presenting symptoms, physical exam findings, and initial radiographic and laboratory data in the context of their medical condition is felt to place them at decreased risk for further clinical deterioration. Furthermore, it is anticipated that the patient will be medically stable for discharge from the hospital within 2 midnights of admission.   Zada Finders MD [facility]  If 7PM-7AM, please contact night-coverage [email or web address]  11/20/2022, 7:46 PM

## 2022-11-20 NOTE — Assessment & Plan Note (Signed)
S/p AV nodal ablation and CRT-D with V-paced rhythm on admit. -Continue Eliquis

## 2022-11-20 NOTE — Hospital Course (Signed)
[name] is a 67 y.o. male with medical history significant for permanent A-fib s/p AV nodal ablation on Eliquis, HFrEF (EF 40%) and CHB s/p CRT-D, COPD, HTN, HLD, anxiety, OSA intolerant to CPAP who is admitted with cellulitis of the right lower extremity failing outpatient antibiotics.

## 2022-11-20 NOTE — ED Triage Notes (Signed)
Pt c/o swelling to R leg x2wks, seen for same & prescribed oral abx; called & advised to come to ED for IV abx. Also c/o increased SHOB  Hx COPD, CHF

## 2022-11-20 NOTE — Assessment & Plan Note (Signed)
-   Continue Flomax 

## 2022-11-20 NOTE — Assessment & Plan Note (Signed)
Intolerant of CPAP.  Use supplemental O2 as needed.

## 2022-11-20 NOTE — Assessment & Plan Note (Signed)
Continue Effexor and home Xanax.

## 2022-11-20 NOTE — Progress Notes (Signed)
Plan of Care Note for accepted transfer   Patient: [name] MRN: [id number]   DOA: 11/20/2022  Facility requesting transfer: [facility] Requesting Provider: Tamera Punt Reason for transfer: Cellulitis  Facility course: Patient with h/o chronic systolic CHF with AICD; afib on Coumadin; depression; COPD; HTN; HLD; DM; and OSA not on CPAP presenting with LE edema.  RLE cellulitis, h/o chronic wounds in the past.  More swelling 1 month ago, more red and painful.  Started on Clindamycin a week ago without improvement.  Redness to knee and streaking to medial thigh.  Not systemically ill.  Cultures drawn, started on Rocephin and Flagyl.    Plan of care: The patient is accepted for observation to [place]  unit, at [facility].  Author: Karmen Bongo, MD 11/20/2022  Check [email or web address] for on-call coverage.  Nursing staff, Please call [place] number on Amion as soon as patient's arrival, so appropriate admitting provider can evaluate the pt.

## 2022-11-20 NOTE — Assessment & Plan Note (Signed)
Continue Coreg, eplerenone, losartan.

## 2022-11-20 NOTE — Assessment & Plan Note (Signed)
Failed outpatient antibiotics.  Hemodynamically stable without fever or leukocytosis.  Blood cultures in process.  Lower extremity Dopplers on 2/2 negative for evidence of DVT. -Continue IV ceftriaxone

## 2022-11-20 NOTE — ED Provider Notes (Signed)
[facility] [place] Provider Note   CSN: [id number] Arrival date & time: 11/20/22  1150     History  Chief Complaint  Patient presents with   Leg Swelling    R leg   Shortness of Breath    [name] is a 67 y.o. male.  Patient is a 67 year old male with a history of paroxysmal atrial fibrillation, hyperlipidemia, pacemaker placement, diabetes, hypertension who presents with redness and swelling with pain of his right leg.  He said it started becoming little painful about a month ago.  He has progressively had some increased swelling and redness of the leg.  Over the last week its gotten worse.  He was started on antibiotics about a week ago.  Initially he was prescribed doxycycline by his cardiologist but he at the same time had a video visit with his primary care doctor who prescribed him clindamycin so he only chose to take the clindamycin.  Today his cardiology office advised him to take both the medications that we took both of them today.  They recommended that he come here and possibly be admitted for IV antibiotics given that has not had improvement with oral antibiotics.  He denies any fevers.  He said the redness has progressed up past his knee and into his thigh.  He has previously had some poor healing wounds to the leg.  He has had to go to the wound clinic in the past.  So he has scars on his lower leg.  One of the areas has been weeping.       Home Medications Prior to Admission medications   Medication Sig Start Date End Date Taking? Authorizing Provider  acetaminophen (TYLENOL) 500 MG tablet Take 500 mg by mouth every 6 (six) hours as needed for moderate pain.    [provider]  albuterol (VENTOLIN HFA) 108 (90 Base) MCG/ACT inhaler INHALE 2 PUFFS BY MOUTH 4 TIMES A DAY AS NEEDED 07/28/22   [name], MD  ALPRAZolam Duanne Moron) 0.5 MG tablet Take 2 tablets (1 mg total) by mouth at bedtime. 07/10/22   [name], MD   augmented betamethasone dipropionate (DIPROLENE-AF) 0.05 % cream Apply 1 application topically daily as needed (itching). 10/11/20   [provider]  carisoprodol (SOMA) 350 MG tablet TAKE 1 TABLET BY MOUTH THREE TIMES A DAY AS NEEDED FOR MUSCLE SPASM 10/31/22   [name], MD  carvedilol (COREG) 6.25 MG tablet TAKE 1 TABLET BY MOUTH TWICE A DAY 09/24/22   Larey Dresser, MD  Cholecalciferol 100 MCG (4000 UT) CAPS 1 tab by mouth once daily 03/18/21   [name], MD  clindamycin (CLEOCIN) 300 MG capsule Take 1 capsule (300 mg total) by mouth 3 (three) times daily. 11/14/22   [name], MD  dapagliflozin propanediol (FARXIGA) 10 MG TABS tablet Take 1 tablet (10 mg total) by mouth daily before breakfast. 12/18/21   Larey Dresser, MD  doxepin (SINEQUAN) 10 MG capsule TAKE 2 CAPSULES (20 MG TOTAL) BY MOUTH AT BEDTIME AS NEEDED. 05/14/22   [name], MD  doxycycline (VIBRAMYCIN) 50 MG capsule Take 2 capsules (100 mg total) by mouth 2 (two) times daily for 7 days. 11/13/22 11/20/22  Rafael Bihari, FNP  Dupilumab (DUPIXENT) 300 MG/2ML SOPN Inject 300 mg into the skin every 14 (fourteen) days.    [provider]  ELIQUIS 5 MG TABS tablet TAKE 1 TABLET BY MOUTH TWICE A  DAY 10/11/22   Larey Dresser, MD  eplerenone (INSPRA) 50 MG tablet Take 1 tablet (50 mg total) by mouth daily. 09/15/21   Larey Dresser, MD  Evolocumab (REPATHA SURECLICK) 544 MG/ML SOAJ INJECT 1 PEN INTO THE SKIN EVERY 14 (FOURTEEN) DAYS. 08/22/22   Larey Dresser, MD  fluticasone (FLONASE) 50 MCG/ACT nasal spray PLACE 2 SPRAYS INTO BOTH NOSTRILS DAILY AS NEEDED FOR ALLERGIES OR RHINITIS. 08/30/22   [name], MD  Fluticasone-Umeclidin-Vilant (TRELEGY ELLIPTA) 100-62.5-25 MCG/ACT AEPB Inhale 1 puff into the lungs daily. 10/04/22   Margaretha Seeds, MD  hydrOXYzine (ATARAX/VISTARIL) 10 MG tablet TAKE 1 TABLET BY MOUTH 3 TIMES DAILY AS NEEDED FOR ITCHING. 04/25/21   [name], MD   ipratropium-albuterol (DUONEB) 0.5-2.5 (3) MG/3ML SOLN TAKE 3 ML BY NEBULIZATION EVERY 4 HOURS AS NEEDED (WHEEZING). 10/16/22   [name], MD  ketoconazole (NIZORAL) 2 % cream Apply 1 Application topically daily. 09/26/22   Plotnikov, Evie Lacks, MD  KLOR-CON M20 20 MEQ tablet TAKE 1 TABLET BY MOUTH EVERY DAY 09/27/22   Larey Dresser, MD  lidocaine (LIDODERM) 5 % APPLY 1 PATCH TO AFFECTED AREA FOR UP TO 12 HOURS. DO NOT USE MORE THAN ONE PATCH IN 24 HOURS. 03/08/22   [name], MD  losartan (COZAAR) 25 MG tablet Take 1 tablet (25 mg total) by mouth daily. 08/30/22   Larey Dresser, MD  metolazone (ZAROXOLYN) 2.5 MG tablet Take 1 tablet (2.5 mg total) by mouth as directed. With additional 40 meq of potassium 05/17/21 03/21/23  Milford, Maricela Bo, FNP  omeprazole (PRILOSEC) 20 MG capsule TAKE 1 CAPSULE BY MOUTH TWICE A DAY 05/24/22   [name], MD  potassium chloride SA (KLOR-CON M) 20 MEQ tablet Take 2 tablets (40 mEq total) by mouth daily. 10/05/22   Rafael Bihari, FNP  predniSONE (DELTASONE) 20 MG tablet Take 1 tablet (20 mg total) by mouth daily with breakfast. 11/12/22   Lyndal Pulley, DO  Respiratory Therapy Supplies (FULL KIT NEBULIZER SET) MISC 1 each by Does not apply route as needed. 09/24/22   Bonnita Hollow, MD  silver sulfADIAZINE (SILVADENE) 1 % cream APPLY TOPICALLY TO AFFECTED AREA EVERY DAY 02/19/22   [name], MD  tamsulosin (FLOMAX) 0.4 MG CAPS capsule TAKE 1 CAPSULE BY MOUTH EVERY DAY 03/02/22   [name], MD  torsemide (DEMADEX) 20 MG tablet Take 4 tablets (80 mg total) by mouth 2 (two) times daily. 10/05/22   Rafael Bihari, FNP  traMADol (ULTRAM) 50 MG tablet TAKE 1 TABLET BY MOUTH EVERY DAY AS NEEDED 08/26/22   [name], MD  traZODone (DESYREL) 100 MG tablet TAKE 2 TABLETS BY MOUTH AT BEDTIME AS NEEDED 10/14/22   [name], MD  TUMS 500 MG chewable tablet Chew 500-2,000 mg by mouth every 4 (four) hours as needed for indigestion or heartburn.   10/19/16   [provider]  venlafaxine XR (EFFEXOR XR) 150 MG 24 hr capsule Take 1 capsule (150 mg total) by mouth daily with breakfast. 06/29/22   [name], MD  zolpidem (AMBIEN) 10 MG tablet TAKE 1 TABLET BY MOUTH EVERY DAY AT BEDTIME AS NEEDED 09/13/22   [name], MD      Allergies    Amiodarone, Statins, and Tape    Review of Systems   Review of Systems  Constitutional:  Negative for chills, diaphoresis, fatigue and fever.  HENT:  Negative  for congestion, rhinorrhea and sneezing.   Eyes: Negative.   Respiratory:  Negative for cough, chest tightness and shortness of breath.   Cardiovascular:  Positive for leg swelling. Negative for chest pain.  Gastrointestinal:  Negative for abdominal pain, blood in stool, diarrhea, nausea and vomiting.  Genitourinary:  Negative for difficulty urinating, flank pain, frequency and hematuria.  Musculoskeletal:  Negative for arthralgias and back pain.  Skin:  Positive for color change. Negative for rash and wound.  Neurological:  Negative for dizziness, speech difficulty, weakness, numbness and headaches.    Physical Exam Updated Vital Signs BP 133/86   Pulse 77   Temp 97.7 F (36.5 C) (Oral)   Resp 18   Ht '5\' 10"'$  (1.778 m)   Wt 126.1 kg   SpO2 94%   BMI 39.89 kg/m  Physical Exam Constitutional:      Appearance: He is well-developed.  HENT:     Head: Normocephalic and atraumatic.  Eyes:     Pupils: Pupils are equal, round, and reactive to light.  Cardiovascular:     Rate and Rhythm: Normal rate and regular rhythm.     Heart sounds: Normal heart sounds.  Pulmonary:     Effort: Pulmonary effort is normal. No respiratory distress.     Breath sounds: Normal breath sounds. No wheezing or rales.  Chest:     Chest wall: No tenderness.  Abdominal:     General: Bowel sounds are normal.     Palpations: Abdomen is soft.     Tenderness: There is no abdominal tenderness. There is no guarding or rebound.  Musculoskeletal:         General: Normal range of motion.     Cervical back: Normal range of motion and neck supple.     Comments: +2 to 3+ edema of the right leg as compared to the left.  He has some scars from prior wounds.  He has warmth and erythema to the lower leg from about the knee to the ankle.  Does not involve the foot.  He has some redness tracking into his medial thigh up toward his groin.  I am able to faintly palpate a pedal pulse,  Lymphadenopathy:     Cervical: No cervical adenopathy.  Skin:    General: Skin is warm and dry.     Findings: No rash.  Neurological:     Mental Status: He is alert and oriented to person, place, and time.     ED Results / Procedures / Treatments   Labs (all labs ordered are listed, but only abnormal results are displayed) Labs Reviewed  CBC WITH DIFFERENTIAL/PLATELET - Abnormal; Notable for the following components:      Result Value   RBC 4.12 (*)    Abs Immature Granulocytes 0.11 (*)    All other components within normal limits  COMPREHENSIVE METABOLIC PANEL - Abnormal; Notable for the following components:   Sodium 134 (*)    BUN 26 (*)    Calcium 8.5 (*)    Albumin 3.2 (*)    ALT 48 (*)    All other components within normal limits  CULTURE, BLOOD (ROUTINE X 2)  CULTURE, BLOOD (ROUTINE X 2)  LACTIC ACID, PLASMA    EKG EKG Interpretation  Date/Time:  Tuesday November 20 2022 11:59:03 EST Ventricular Rate:  74 PR Interval:    QRS Duration: 185 QT Interval:  431 QTC Calculation: 479 R Axis:   124 Text Interpretation: VENTRICULAR PACED RHYTHM Confirmed by Malvin Johns [phone])  on 11/20/2022 12:28:50 PM  Radiology DG Tibia/Fibula Right  Result Date: 11/20/2022 CLINICAL DATA:  Cellulitis, rule out osteomyelitis EXAM: RIGHT TIBIA AND FIBULA - 2 VIEW COMPARISON:  12/21/2019 FINDINGS: Knee arthroplasty components project in expected location. Chronic patellar fracture with further distraction of dominant fracture fragments since previous. No acute  fracture or dislocation. No effusion. Arterial calcifications in the calf. Calcaneal spur. IMPRESSION: 1. No acute findings. 2. Chronic patellar fracture with further distraction since previous. Electronically Signed   By: Lucrezia Europe M.D.   On: 11/20/2022 13:41   DG Chest 2 View  Result Date: 11/20/2022 CLINICAL DATA:  SOB EXAM: CHEST - 2 VIEW COMPARISON:  10/10/2021 FINDINGS: Enlarged cardiac silhouette. Consolidation or volume loss of the bases. No pneumothorax. Normal pulmonary vasculature. Left-sided pacemaker. IMPRESSION: Enlarged cardiac silhouette. Bibasilar volume loss or consolidation. Electronically Signed   By: Sammie Bench M.D.   On: 11/20/2022 12:35    Procedures Procedures    Medications Ordered in ED Medications  cefTRIAXone (ROCEPHIN) 2 g in sodium chloride 0.9 % 100 mL IVPB (0 g Intravenous Stopped 11/20/22 1423)    And  metroNIDAZOLE (FLAGYL) IVPB 500 mg (500 mg Intravenous New Bag/Given 11/20/22 1428)  ipratropium-albuterol (DUONEB) 0.5-2.5 (3) MG/3ML nebulizer solution 3 mL (3 mLs Nebulization Given 11/20/22 1210)    ED Course/ Medical Decision Making/ A&P                             Medical Decision Making Amount and/or Complexity of Data Reviewed Labs: ordered. Radiology: ordered.  Risk Prescription drug management. Decision regarding hospitalization.   Patient is a 67 year old who presents with some worsening swelling and redness to his right leg.  He is afebrile.  There is no open wounds.  He had a recent Doppler study which showed no evidence of DVT.  X-rays were obtained here in the ED which were interpreted by me and confirmed by the radiologist to show no evidence of osteo on the current imaging studies.  His labs are nonconcerning.  His lactate is normal.  He was started on IV antibiotics.  Given his failure of outpatient treatment, will plan admission.  I spoke with Dr. Inda Merlin who is accepted the patient for admission.  Final Clinical Impression(s) / ED  Diagnoses Final diagnoses:  Cellulitis of right lower extremity    Rx / DC Orders ED Discharge Orders     None         Malvin Johns, MD 11/20/22 1506

## 2022-11-21 DIAGNOSIS — I10 Essential (primary) hypertension: Secondary | ICD-10-CM | POA: Diagnosis not present

## 2022-11-21 DIAGNOSIS — I5084 End stage heart failure: Secondary | ICD-10-CM | POA: Diagnosis present

## 2022-11-21 DIAGNOSIS — Z79899 Other long term (current) drug therapy: Secondary | ICD-10-CM | POA: Diagnosis not present

## 2022-11-21 DIAGNOSIS — Z87891 Personal history of nicotine dependence: Secondary | ICD-10-CM | POA: Diagnosis not present

## 2022-11-21 DIAGNOSIS — R0602 Shortness of breath: Secondary | ICD-10-CM | POA: Diagnosis present

## 2022-11-21 DIAGNOSIS — G4733 Obstructive sleep apnea (adult) (pediatric): Secondary | ICD-10-CM | POA: Diagnosis not present

## 2022-11-21 DIAGNOSIS — E662 Morbid (severe) obesity with alveolar hypoventilation: Secondary | ICD-10-CM | POA: Diagnosis present

## 2022-11-21 DIAGNOSIS — M797 Fibromyalgia: Secondary | ICD-10-CM | POA: Diagnosis present

## 2022-11-21 DIAGNOSIS — Z6839 Body mass index (BMI) 39.0-39.9, adult: Secondary | ICD-10-CM | POA: Diagnosis not present

## 2022-11-21 DIAGNOSIS — F419 Anxiety disorder, unspecified: Secondary | ICD-10-CM

## 2022-11-21 DIAGNOSIS — I442 Atrioventricular block, complete: Secondary | ICD-10-CM | POA: Diagnosis present

## 2022-11-21 DIAGNOSIS — F32A Depression, unspecified: Secondary | ICD-10-CM

## 2022-11-21 DIAGNOSIS — E876 Hypokalemia: Secondary | ICD-10-CM | POA: Diagnosis not present

## 2022-11-21 DIAGNOSIS — I429 Cardiomyopathy, unspecified: Secondary | ICD-10-CM | POA: Diagnosis present

## 2022-11-21 DIAGNOSIS — I5020 Unspecified systolic (congestive) heart failure: Secondary | ICD-10-CM | POA: Diagnosis not present

## 2022-11-21 DIAGNOSIS — I4821 Permanent atrial fibrillation: Secondary | ICD-10-CM | POA: Diagnosis present

## 2022-11-21 DIAGNOSIS — L03116 Cellulitis of left lower limb: Secondary | ICD-10-CM | POA: Diagnosis present

## 2022-11-21 DIAGNOSIS — E8809 Other disorders of plasma-protein metabolism, not elsewhere classified: Secondary | ICD-10-CM | POA: Diagnosis not present

## 2022-11-21 DIAGNOSIS — Z95810 Presence of automatic (implantable) cardiac defibrillator: Secondary | ICD-10-CM | POA: Diagnosis not present

## 2022-11-21 DIAGNOSIS — I5022 Chronic systolic (congestive) heart failure: Secondary | ICD-10-CM | POA: Diagnosis present

## 2022-11-21 DIAGNOSIS — L03115 Cellulitis of right lower limb: Secondary | ICD-10-CM | POA: Diagnosis present

## 2022-11-21 DIAGNOSIS — Z9981 Dependence on supplemental oxygen: Secondary | ICD-10-CM | POA: Diagnosis not present

## 2022-11-21 DIAGNOSIS — E1142 Type 2 diabetes mellitus with diabetic polyneuropathy: Secondary | ICD-10-CM | POA: Diagnosis present

## 2022-11-21 DIAGNOSIS — N179 Acute kidney failure, unspecified: Secondary | ICD-10-CM | POA: Diagnosis not present

## 2022-11-21 DIAGNOSIS — J438 Other emphysema: Secondary | ICD-10-CM

## 2022-11-21 DIAGNOSIS — I130 Hypertensive heart and chronic kidney disease with heart failure and stage 1 through stage 4 chronic kidney disease, or unspecified chronic kidney disease: Secondary | ICD-10-CM | POA: Diagnosis present

## 2022-11-21 DIAGNOSIS — E1122 Type 2 diabetes mellitus with diabetic chronic kidney disease: Secondary | ICD-10-CM | POA: Diagnosis present

## 2022-11-21 DIAGNOSIS — E785 Hyperlipidemia, unspecified: Secondary | ICD-10-CM | POA: Diagnosis present

## 2022-11-21 DIAGNOSIS — N400 Enlarged prostate without lower urinary tract symptoms: Secondary | ICD-10-CM | POA: Diagnosis present

## 2022-11-21 DIAGNOSIS — L02419 Cutaneous abscess of limb, unspecified: Secondary | ICD-10-CM | POA: Diagnosis present

## 2022-11-21 LAB — BASIC METABOLIC PANEL
Anion gap: 9 (ref 5–15)
BUN: 24 mg/dL — ABNORMAL HIGH (ref 8–23)
CO2: 28 mmol/L (ref 22–32)
Calcium: 9 mg/dL (ref 8.9–10.3)
Chloride: 99 mmol/L (ref 98–111)
Creatinine, Ser: 0.95 mg/dL (ref 0.61–1.24)
GFR, Estimated: >60 mL/min (ref >60)
Glucose, Bld: 85 mg/dL (ref 70–99)
Potassium: 4.2 mmol/L (ref 3.5–5.1)
Sodium: 136 mmol/L (ref 135–145)

## 2022-11-21 LAB — CBC
HCT: 46.6 % (ref 39.0–52.0)
Hemoglobin: 14.8 g/dL (ref 13.0–17.0)
MCH: 32.7 pg (ref 26.0–34.0)
MCHC: 31.8 g/dL (ref 30.0–36.0)
MCV: 102.9 fL — ABNORMAL HIGH (ref 80.0–100.0)
Platelets: 260 10*3/uL (ref 150–400)
RBC: 4.53 MIL/uL (ref 4.22–5.81)
RDW: 12.7 % (ref 11.5–15.5)
WBC: 11 10*3/uL — ABNORMAL HIGH (ref 4.0–10.5)
nRBC: 0 % (ref 0.0–0.2)

## 2022-11-21 LAB — MRSA NEXT GEN BY PCR, NASAL: MRSA by PCR Next Gen: DETECTED — AB

## 2022-11-21 LAB — HIV ANTIBODY (ROUTINE TESTING W REFLEX): HIV Screen 4th Generation wRfx: NONREACTIVE

## 2022-11-21 MED ORDER — PANTOPRAZOLE SODIUM 40 MG PO TBEC
40.0000 mg | DELAYED_RELEASE_TABLET | Freq: Every day | ORAL | Status: DC
  Administered 2022-11-21 – 2022-11-24 (×4): 40 mg via ORAL
  Filled 2022-11-21 (×4): qty 1

## 2022-11-21 MED ORDER — ALUM & MAG HYDROXIDE-SIMETH 200-200-20 MG/5ML PO SUSP
30.0000 mL | ORAL | Status: DC | PRN
  Administered 2022-11-21: 30 mL via ORAL
  Filled 2022-11-21: qty 30

## 2022-11-21 MED ORDER — POTASSIUM CHLORIDE CRYS ER 20 MEQ PO TBCR: 40.0000 meq | EXTENDED_RELEASE_TABLET | Freq: Every day | ORAL | Status: DC

## 2022-11-21 MED ORDER — CARISOPRODOL 350 MG PO TABS
350.0000 mg | ORAL_TABLET | Freq: Three times a day (TID) | ORAL | Status: DC | PRN
  Administered 2022-11-21 – 2022-11-23 (×6): 350 mg via ORAL
  Filled 2022-11-21 (×7): qty 1

## 2022-11-21 MED ORDER — CEFAZOLIN SODIUM-DEXTROSE 2-4 GM/100ML-% IV SOLN
2.0000 g | Freq: Three times a day (TID) | INTRAVENOUS | Status: DC
  Administered 2022-11-21 – 2022-11-23 (×6): 2 g via INTRAVENOUS
  Filled 2022-11-21 (×8): qty 100

## 2022-11-21 MED ORDER — HYDROXYZINE HCL 10 MG PO TABS: 5.0000 mg | ORAL_TABLET | Freq: Three times a day (TID) | ORAL | Status: DC | PRN

## 2022-11-21 MED ORDER — PREDNISONE 10 MG PO TABS
10.0000 mg | ORAL_TABLET | Freq: Every day | ORAL | Status: DC
  Administered 2022-11-22 – 2022-11-24 (×3): 10 mg via ORAL
  Filled 2022-11-21 (×3): qty 1

## 2022-11-21 MED ORDER — DULOXETINE HCL 30 MG PO CPEP
60.0000 mg | ORAL_CAPSULE | Freq: Every day | ORAL | Status: DC
  Administered 2022-11-21 – 2022-11-24 (×4): 60 mg via ORAL
  Filled 2022-11-21 (×4): qty 2

## 2022-11-21 NOTE — Progress Notes (Signed)
OT Cancellation Note  Patient Details Name: FORD PEDDIE MRN: [id number] DOB: June 01, 1956   Cancelled Treatment:    Reason Eval/Treat Not Completed: OT screened, no needs identified, will sign off. Patient ambulating independently in room and independent with ADLs per RN. No apparent OT needs.  Lenward Chancellor 11/21/2022, 4:23 PM

## 2022-11-21 NOTE — Progress Notes (Signed)
PROGRESS NOTE    [name]  WUX:[id number] DOB: 22-Jul-1956 DOA: 11/20/2022 PCP: Troy Sine, MD   Brief Narrative:  [name] is a 67 y.o. male with medical history significant for permanent A-fib s/p AV nodal ablation on Eliquis, HFrEF (EF 40%) and CHB s/p CRT-D, COPD, HTN, HLD, anxiety, OSA intolerant to CPAP who is admitted with cellulitis of the right lower extremity failing outpatient antibiotics.  Right leg remains significantly swollen compared to left and recent DVT scan done and was negative for DVT.  Given failed outpatient antibiotics we have consulted infectious diseases for further evaluation.  Currently he remains on IV Ceftriaxone.  Assessment and Plan: * Cellulitis of right lower extremity without foot -Failed outpatient antibiotics with p.o. Doxycycline and Clindamycin  hemodynamically stable without fever or leukocytosis.   -Blood cultures in process.  -Recent outpatient Lower extremity Dopplers on 2/2 negative for evidence of DVT. -DG Tib/Fib X-Ray done and showed "No acute findings. Chronic patellar fracture with further distraction since previous." -Continue IV ceftriaxone for now  -Consult ID for further evaluation and recommendations  HFrEF (heart failure with reduced ejection fraction) (HCC) -S/p CRT-D.   -Overall appears compensated aside from unilateral RLE edema in setting of cellulitis.  -Last EF 40% 10/24/2022 and also showed that the left ventricular mildly decreased function with global hypokinesis and the left ventricular diastolic parameters were indeterminate.  Right ventricular systolic function was normal and the right ventricular size is mildly enlarged and there was normal pulmonary artery systolic pressure -Continue Torsemide 80 mg twice daily -Continue Carvedilol 6.25 mg p.o. twice daily, Losartan 12.5 mg p.o. nightly, Spironolactone 25 mg p.o. nightly, and Torsemide 80 mg p.o. twice daily -Strict I's and O's and daily weights -Patient is  down 8 pounds since admission and is -2.461 L -Continue to monitor for signs and symptoms of volume overload and repeat chest x-ray in a.m.  Permanent Atrial Fibrillation (HCC) -S/p AV nodal ablation and CRT-D with V-paced rhythm on admit. -Continue 6.25 mg p.o. twice daily and anticoagulation with apixaban 5 mg p.o. twice daily -If necessary transfer him will place him on telemetry  COPD (Chronic Obstructive Pulmonary disease) ([facility]) -Stable without wheezing on admission.  -Continue Trelegy Ellipta substitution of Breo Ellipta and Incruse Ellipta given together 1 puff IH daily and albuterol 2.5 mg every 6 as needed for wheezing/breath as well as DuoNeb 3 mL's every 4 as needed wheezing or shortness of breath -SpO2: 97 % -C/w Prednisone 10 mg po Daily  -Continue to monitor respiratory status carefully and if necessary provide supplemental oxygen -DG Chest X-Ray on admission showed "Enlarged cardiac silhouette. Consolidation or volume loss of the bases. No pneumothorax. Normal pulmonary vasculature. Left-sided pacemaker." -Continue with Dupixent in the outpatient setting  Essential Hypertension -Continue Carvedilol 6.25 mg p.o. twice daily, Losartan 12.5 mg p.o. nightly, Spironolactone 25 mg p.o. nightly, and Torsemide 80 mg p.o. twice daily -Continue to Monitor BP per Protocol  -Last BP reading was   Anxiety and Depression Insomnia -Continue Venlafaxine XR 150 mg po Daily, Zolpidem 10 mg po qHS, Doxepin 10 mg po qH  and home Alprazolam 1 mg po qHS. -Resume home Duloxetine  Benign Prostatic Hyperplasia -Continue Tamsulosin 0.4 mg po Daily .  Obstructive Sleep Apnea/OHS -Intolerant of CPAP.  Use supplemental O2 as needed.  Leukocytosis -WBC Trend: Recent Labs  Lab 11/20/22 1205 11/21/22 0538  WBC 9.1 11.0*  -Likely in the setting of RLE Cellulitis -C/w Antibiotics as above but will have  ID evaluate  -LA was 1.2 -Continue to Monitor and Trend WBC Count and repeat CBC in the AM    Hypoalbuminemia -Patient's Albumin Trend: Recent Labs  Lab 11/20/22 1205  ALBUMIN 3.2*  -Continue to Monitor and Trend and repeat CMP in the AM  Obesity -Complicates overall prognosis and care -Estimated body mass index is 38.86 kg/m as calculated from the following:   Height as of this encounter: '5\' 10"'$  (1.778 m).   Weight as of this encounter: 122.8 kg.  -Weight Loss and Dietary Counseling given  DVT prophylaxis:  apixaban (ELIQUIS) tablet 5 mg    Code Status: Full Code Family Communication: No family currently at bedside  Disposition Plan:  Level of care: Med-Surg Status is: Observation The patient will require care spanning > 2 midnights and should be moved to inpatient because: Needs further clinical improvement and evaluation by ID.  Remains on IV antibiotic coverage   Consultants:  Infectious diseases Dr. Baxter Flattery  Procedures:  As delineated as above  Antimicrobials:  Anti-infectives (From admission, onward)    Start     Dose/Rate Route Frequency Ordered Stop   11/20/22 1330  cefTRIAXone (ROCEPHIN) 2 g in sodium chloride 0.9 % 100 mL IVPB       See Hyperspace for full Linked Orders Report.   2 g 200 mL/hr over 30 Minutes Intravenous Every 24 hours 11/20/22 1323 11/27/22 1329   11/20/22 1330  metroNIDAZOLE (FLAGYL) IVPB 500 mg  Status:  Discontinued       See Hyperspace for full Linked Orders Report.   500 mg 100 mL/hr over 60 Minutes Intravenous Every 12 hours 11/20/22 1323 11/20/22 1941       Subjective: Seen and examined at bedside was complaining of some right leg and calf pain.  States that he recently had a DVT scan which I have evaluated and showed no evidence of DVT 5 days ago.  No nausea or vomiting.  States his right leg is much more swollen than his left and its gotten worse given that he has failed his outpatient antibiotics with doxycycline and clindamycin.  No other concerns or complaints at this time.  Objective: Vitals:   11/20/22 2152  11/21/22 0202 11/21/22 0614 11/21/22 0751  BP: (!) 140/84 116/74 134/76   Pulse: 71 72 69   Resp: '16 16 20   '$ Temp: 97.6 F (36.4 C) 97.7 F (36.5 C) 98 F (36.7 C)   TempSrc:  Oral Oral   SpO2: 98% 97% 97% 97%  Weight:   122.8 kg   Height:        Intake/Output Summary (Last 24 hours) at 11/21/2022 0845 Last data filed at 11/21/2022 0500 Gross per 24 hour  Intake 663.6 ml  Output 3125 ml  Net -2461.4 ml   Filed Weights   11/20/22 1351 11/21/22 0614  Weight: 126.1 kg 122.8 kg   Examination: Physical Exam:  Constitutional: WN/WD obese Caucasian male currently no acute distress appears calm but does appear slightly uncomfortable Respiratory: Diminished to auscultation bilaterally with coarse breath sounds, no wheezing, rales, rhonchi or crackles. Normal respiratory effort and patient is not tachypenic. No accessory muscle use.  Not wearing any supplemental oxygen via nasal cannula Cardiovascular: RRR, no murmurs / rubs / gallops. S1 and S2 auscultated.  Of lower extremity edema worse on the right compared to left Abdomen: Soft, non-tender, distended secondary to body habitus. Bowel sounds positive.  GU: Deferred. Musculoskeletal: No clubbing / cyanosis of digits/nails. No joint deformity upper and lower extremities.  Skin: Right leg is warm and erythematous and discolored compared to the left and the right leg is at least twice the size of the left leg Neurologic: CN 2-12 grossly intact with no focal deficits. Romberg sign and cerebellar reflexes not assessed.  Psychiatric: Normal judgment and insight. Alert and oriented x 3. Normal mood and appropriate affect.   Data Reviewed: I have personally reviewed following labs and imaging studies  CBC: Recent Labs  Lab 11/20/22 1205 11/21/22 0538  WBC 9.1 11.0*  NEUTROABS 6.9  --   HGB 13.1 14.8  HCT 41.2 46.6  MCV 100.0 102.9*  PLT 236 024   Basic Metabolic Panel: Recent Labs  Lab 11/20/22 1205 11/21/22 0538  NA 134* 136  K  4.1 4.2  CL 106 99  CO2 22 28  GLUCOSE 97 85  BUN 26* 24*  CREATININE 1.07 0.95  CALCIUM 8.5* 9.0   GFR: Estimated Creatinine Clearance: 99.1 mL/min (by C-G formula based on SCr of 0.95 mg/dL). Liver Function Tests: Recent Labs  Lab 11/20/22 1205  AST 26  ALT 48*  ALKPHOS 50  BILITOT 0.6  PROT 6.7  ALBUMIN 3.2*   No results for input(s): "LIPASE", "AMYLASE" in the last 168 hours. No results for input(s): "AMMONIA" in the last 168 hours. Coagulation Profile: No results for input(s): "INR", "PROTIME" in the last 168 hours. Cardiac Enzymes: No results for input(s): "CKTOTAL", "CKMB", "CKMBINDEX", "TROPONINI" in the last 168 hours. BNP (last 3 results) No results for input(s): "PROBNP" in the last 8760 hours. HbA1C: No results for input(s): "HGBA1C" in the last 72 hours. CBG: No results for input(s): "GLUCAP" in the last 168 hours. Lipid Profile: No results for input(s): "CHOL", "HDL", "LDLCALC", "TRIG", "CHOLHDL", "LDLDIRECT" in the last 72 hours. Thyroid Function Tests: No results for input(s): "TSH", "T4TOTAL", "FREET4", "T3FREE", "THYROIDAB" in the last 72 hours. Anemia Panel: No results for input(s): "VITAMINB12", "FOLATE", "FERRITIN", "TIBC", "IRON", "RETICCTPCT" in the last 72 hours. Sepsis Labs: Recent Labs  Lab 11/20/22 1334  LATICACIDVEN 1.2    Recent Results (from the past 240 hour(s))  Blood Cultures x 2 sites     Status: None (Preliminary result)   Collection Time: 11/20/22  1:34 PM   Specimen: BLOOD LEFT FOREARM  Result Value Ref Range Status   Specimen Description   Final    BLOOD LEFT FOREARM Performed at [facility], [place] [address], [place],  [address]    Special Requests   Final    BOTTLES DRAWN AEROBIC AND ANAEROBIC Blood Culture adequate volume Performed at [facility], [address] [place], [place] [address]    Culture PENDING  Incomplete   Report Status PENDING  Incomplete     Radiology Studies: DG  Tibia/Fibula Right  Result Date: 11/20/2022 CLINICAL DATA:  Cellulitis, rule out osteomyelitis EXAM: RIGHT TIBIA AND FIBULA - 2 VIEW COMPARISON:  12/21/2019 FINDINGS: Knee arthroplasty components project in expected location. Chronic patellar fracture with further distraction of dominant fracture fragments since previous. No acute fracture or dislocation. No effusion. Arterial calcifications in the calf. Calcaneal spur. IMPRESSION: 1. No acute findings. 2. Chronic patellar fracture with further distraction since previous. Electronically Signed   By: Lucrezia Europe M.D.   On: 11/20/2022 13:41   DG Chest 2 View  Result Date: 11/20/2022 CLINICAL DATA:  SOB EXAM: CHEST - 2 VIEW COMPARISON:  10/10/2021 FINDINGS: Enlarged cardiac silhouette. Consolidation or volume loss of the bases. No pneumothorax. Normal pulmonary vasculature. Left-sided pacemaker. IMPRESSION:  Enlarged cardiac silhouette. Bibasilar volume loss or consolidation. Electronically Signed   By: Sammie Bench M.D.   On: 11/20/2022 12:35    Scheduled Meds:  ALPRAZolam  1 mg Oral QHS   apixaban  5 mg Oral BID   carvedilol  6.25 mg Oral BID   dapagliflozin propanediol  10 mg Oral QAC breakfast   doxepin  20 mg Oral QHS   fluticasone furoate-vilanterol  1 puff Inhalation Daily   And   umeclidinium bromide  1 puff Inhalation Daily   losartan  12.5 mg Oral QHS   potassium chloride SA  40 mEq Oral Daily   spironolactone  25 mg Oral Daily   tamsulosin  0.4 mg Oral Daily   torsemide  80 mg Oral BID   venlafaxine XR  150 mg Oral Q breakfast   zolpidem  10 mg Oral QHS   Continuous Infusions:  cefTRIAXone (ROCEPHIN)  IV Stopped (11/20/22 1423)    LOS: 0 days   Raiford Noble, DO [facility] Available via Epic secure chat 7am-7pm After these hours, please refer to coverage provider listed on [email or web address] 11/21/2022, 8:45 AM

## 2022-11-21 NOTE — Consult Note (Signed)
[facility]  Total days of antibiotics 2 CTX         Reason for Consult: cellulitis   Referring Physician: sheikh  Principal Problem:   Cellulitis of right lower extremity without foot Active Problems:   Obstructive sleep apnea   Essential hypertension   Benign prostatic hyperplasia   COPD (chronic obstructive pulmonary disease) (HCC)   HFrEF (heart failure with reduced ejection fraction) (HCC)   Permanent atrial fibrillation (HCC)   Anxiety and depression   Cellulitis and abscess of leg, except foot    HPI: [name] is a 67 y.o. male with hx of afib, COPD, HTN, hx of wounds to BLE and recurrent cellulitis, the patient reports started to have left lower leg cellulitis roughly 7-10 days ago and given a course of doxycycline per his cardiologist, then also noted that it started to occur on his right leg due to no signifcant improvement, he was prescribed clindamycin but swelling/streaking persisted for which he came to the ED for evaluation. He was admitted on 2/6, with noteable cellulitis/edema to right leg, also somewhat on left lower leg but to a lesser degree. He denies fever/chills/nightsweats. Right start weeping.  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 Has scarring to lower extremities from prior open wounds.  Xray of right leg was negative for osteomyelitis changes. He was started on ceftriaxone and metronidazole.  Past Medical History:  Diagnosis Date   AICD (automatic cardioverter/defibrillator) present    Anemia    supposed to be taking Vit B but doesn't   ANXIETY    takes Xanax nightly   Arthritis    Asthma    Albuterol prn and Advair daily;also takes Prednisone daily   Cardiomyopathy ([place])    a. EF 25% TEE July 2013; b. EF normalized 2015;  c. 03/2015 Echo: EF 40-45%, difrf HK, PASP 38 mmHg, Mild MR, sev LAE/RAE.   Chronic constipation    takes OTC stool softener   COPD (chronic  obstructive pulmonary disease) (HCC)    "one dr says COPD; one dr says emphysema" (09/18/2017)   DEPRESSION    takes Zoloft and Doxepin daily   Diverticulitis    DYSKINESIA, ESOPHAGUS    Essential hypertension        FIBROMYALGIA    GERD (gastroesophageal reflux disease)        Glaucoma    HYPERLIPIDEMIA    a. Intolerant to statins.   INSOMNIA    takes Ambien nightly   Myocardial infarction [facility])    a. 2012 Myoview notable for prior infarct;  b. 03/2015 Lexiscan CL: EF 37%, diff HK, small area of inferior infarct from apex to base-->Med Rx.   O2 dependent    "2.5L q hs & prn" (09/18/2017)   Paroxysmal atrial fibrillation ([place])    a. CHA2DS2VASc = 3--> takes Coumadin;  b. 03/15/2015 Successful TEE/DCCV;  c. 03/2015 recurrent afib, Amio d/c'd in setting of hyperthyroidism.   Peripheral neuropathy    Pneumonia 12/2016   Rash and other nonspecific skin eruption 04/12/2009   no cause found saw dermatologists x 2 and allergist   SLEEP APNEA, OBSTRUCTIVE    a. doesn't use CPAP   Syncope    a. 03/2015 s/p MDT LINQ.   Type II diabetes mellitus (HCC)         Allergies:  Allergies  Allergen Reactions   Tape Other (See Comments)    SKIN TEARS  VERY, VERY EASILY!!!! Use Paper Tape Only, PLEASE!!!   Amiodarone Other (See Comments)    Caused hyperthyroidism    Statins Other (See Comments)    Myalgias     Current antibiotics:   MEDICATIONS:  ALPRAZolam  1 mg Oral QHS   apixaban  5 mg Oral BID   carvedilol  6.25 mg Oral BID   dapagliflozin propanediol  10 mg Oral QAC breakfast   doxepin  20 mg Oral QHS   DULoxetine  60 mg Oral Daily   fluticasone furoate-vilanterol  1 puff Inhalation Daily   And   umeclidinium bromide  1 puff Inhalation Daily   losartan  12.5 mg Oral QHS   pantoprazole  40 mg Oral Daily   potassium chloride SA  40 mEq Oral Daily   [START ON 11/22/2022] predniSONE  10 mg Oral Q breakfast   spironolactone  25 mg Oral Daily   tamsulosin  0.4 mg Oral Daily    torsemide  80 mg Oral BID   venlafaxine XR  150 mg Oral Q breakfast   zolpidem  10 mg Oral QHS    Social History   Tobacco Use   Smoking status: Former    Packs/day: 2.00    Years: 30.00    Total pack years: 60.00    Types: Cigarettes    Quit date: 10/16/2007    Years since quitting: 15.1   Smokeless tobacco: Never  Vaping Use   Vaping Use: Never used  Substance Use Topics   Alcohol use: No   Drug use: No    Family History  Problem Relation Age of Onset   COPD Mother    Asthma Mother    Colon polyps Mother    Allergies Mother    Hypothyroidism Mother    Asthma Maternal Grandmother    Colon cancer Neg Hx     Review of Systems -  Positive pertinents per hpi. Otherwise negative- for all other systems.  OBJECTIVE: Temp:  [97.6 F (36.4 C)-98.2 F (36.8 C)] 97.7 F (36.5 C) (02/07 1441) Pulse Rate:  [69-72] 69 (02/07 1441) Resp:  [16-20] 18 (02/07 1441) BP: (104-151)/(64-84) 104/64 (02/07 1441) SpO2:  [97 %-99 %] 99 % (02/07 1441) Weight:  [122.8 kg] 122.8 kg (02/07 2841) Physical Exam  Constitutional: He is oriented to person, place, and time. He appears well-developed and well-nourished. No distress.  HENT:  Mouth/Throat: Oropharynx is clear and moist. No oropharyngeal exudate.  Cardiovascular: Normal rate, regular rhythm and normal heart sounds. Exam reveals no gallop and no friction rub.  No murmur heard.  Pulmonary/Chest: Effort normal and breath sounds normal. No respiratory distress. He has no wheezes.  Abdominal: Soft. Bowel sounds are normal. He exhibits no distension. There is no tenderness.  Ext: right leg erythema but also edematous mostly lower leg. Some streaking to upper thigh. No open wounds. Left leg no swelling but limited erythema about ankle Neurological: He is alert and oriented to person, place, and time.  Skin: Skin is warm and dry. No rash noted. No erythema.  Psychiatric: He has a normal mood and affect. His behavior is normal.    LABS: Results for orders placed or performed during the hospital encounter of 11/20/22 (from the past 48 hour(s))  CBC with Differential     Status: Abnormal   Collection Time: 11/20/22 12:05 PM  Result Value Ref Range   WBC 9.1 4.0 - 10.5 K/uL   RBC 4.12 (L) 4.22 - 5.81 MIL/uL   Hemoglobin 13.1 13.0 -  17.0 g/dL   HCT 41.2 39.0 - 52.0 %   MCV 100.0 80.0 - 100.0 fL   MCH 31.8 26.0 - 34.0 pg   MCHC 31.8 30.0 - 36.0 g/dL   RDW 12.5 11.5 - 15.5 %   Platelets 236 150 - 400 K/uL   nRBC 0.0 0.0 - 0.2 %   Neutrophils Relative % 76 %   Neutro Abs 6.9 1.7 - 7.7 K/uL   Lymphocytes Relative 13 %   Lymphs Abs 1.2 0.7 - 4.0 K/uL   Monocytes Relative 9 %   Monocytes Absolute 0.8 0.1 - 1.0 K/uL   Eosinophils Relative 1 %   Eosinophils Absolute 0.1 0.0 - 0.5 K/uL   Basophils Relative 0 %   Basophils Absolute 0.0 0.0 - 0.1 K/uL   Immature Granulocytes 1 %   Abs Immature Granulocytes 0.11 (H) 0.00 - 0.07 K/uL    Comment: Performed at [facility], [address] [place], [place] [address]  Comprehensive metabolic panel     Status: Abnormal   Collection Time: 11/20/22 12:05 PM  Result Value Ref Range   Sodium 134 (L) 135 - 145 mmol/L   Potassium 4.1 3.5 - 5.1 mmol/L   Chloride 106 98 - 111 mmol/L   CO2 22 22 - 32 mmol/L   Glucose, Bld 97 70 - 99 mg/dL    Comment: Glucose reference range applies only to samples taken after fasting for at least 8 hours.   BUN 26 (H) 8 - 23 mg/dL   Creatinine, Ser 1.07 0.61 - 1.24 mg/dL   Calcium 8.5 (L) 8.9 - 10.3 mg/dL   Total Protein 6.7 6.5 - 8.1 g/dL   Albumin 3.2 (L) 3.5 - 5.0 g/dL   AST 26 15 - 41 U/L   ALT 48 (H) 0 - 44 U/L   Alkaline Phosphatase 50 38 - 126 U/L   Total Bilirubin 0.6 0.3 - 1.2 mg/dL   GFR, Estimated >60 >60 mL/min    Comment: (NOTE) Calculated using the CKD-EPI Creatinine Equation (2021)    Anion gap 6 5 - 15    Comment: Performed at [facility], [address] [place], [place] [address]  Lactic acid      Status: None   Collection Time: 11/20/22  1:34 PM  Result Value Ref Range   Lactic Acid, Venous 1.2 0.5 - 1.9 mmol/L    Comment: Performed at [facility], [address] [place], [place] [address]  Blood Cultures x 2 sites     Status: None (Preliminary result)   Collection Time: 11/20/22  1:34 PM   Specimen: BLOOD LEFT FOREARM  Result Value Ref Range   Specimen Description      BLOOD LEFT FOREARM Performed at [facility], [address], [place], [place] [address]    Special Requests      BOTTLES DRAWN AEROBIC AND ANAEROBIC Blood Culture adequate volume Performed at [facility], [address] [place], [place] [address]    Culture      NO GROWTH < 24 HOURS Performed at [facility], [place] [address], [place], [place] [address]    Report Status PENDING   Blood Cultures x 2 sites     Status: None (Preliminary result)   Collection Time: 11/20/22  1:47 PM   Specimen: BLOOD  Result Value Ref Range   Specimen Description      BLOOD RIGHT ANTECUBITAL Performed at [facility], [address] [place], [place] [address]    Special Requests      BOTTLES DRAWN AEROBIC AND ANAEROBIC Blood Culture adequate volume Performed at [facility], [address] [place], [place] [address]    Culture      NO GROWTH < 24 HOURS Performed at [facility], [place] [address] [place], [place] [address]    Report Status PENDING   HIV Antibody (routine testing w rflx)     Status: None   Collection Time: 11/21/22  5:38 AM  Result Value Ref Range   HIV Screen 4th Generation wRfx Non Reactive Non Reactive    Comment: Performed at [facility], [place] [address], [place], [place] [address]  CBC     Status: Abnormal   Collection Time: 11/21/22  5:38 AM  Result Value Ref Range   WBC 11.0 (H) 4.0 - 10.5 K/uL   RBC 4.53 4.22 - 5.81 MIL/uL   Hemoglobin 14.8 13.0 - 17.0 g/dL   HCT 46.6 39.0 - 52.0 %   MCV 102.9 (H) 80.0 - 100.0 fL    MCH 32.7 26.0 - 34.0 pg   MCHC 31.8 30.0 - 36.0 g/dL   RDW 12.7 11.5 - 15.5 %   Platelets 260 150 - 400 K/uL   nRBC 0.0 0.0 - 0.2 %    Comment: Performed at [facility], [place] [address] [place], [place] [address]  Basic metabolic panel     Status: Abnormal   Collection Time: 11/21/22  5:38 AM  Result Value Ref Range   Sodium 136 135 - 145 mmol/L   Potassium 4.2 3.5 - 5.1 mmol/L   Chloride 99 98 - 111 mmol/L   CO2 28 22 - 32 mmol/L   Glucose, Bld 85 70 - 99 mg/dL    Comment: Glucose reference range applies only to samples taken after fasting for at least 8 hours.   BUN 24 (H) 8 - 23 mg/dL   Creatinine, Ser 0.95 0.61 - 1.24 mg/dL   Calcium 9.0 8.9 - 10.3 mg/dL   GFR, Estimated >60 >60 mL/min    Comment: (NOTE) Calculated using the CKD-EPI Creatinine Equation (2021)    Anion gap 9 5 - 15    Comment: Performed at [facility], [place] [address] [place], [place] [address]   *Note: Due to a large number of results and/or encounters for the requested time period, some results have not been displayed. A complete set of results can be found in Results Review.    MICRO: reviewed IMAGING: DG Tibia/Fibula Right  Result Date: 11/20/2022 CLINICAL DATA:  Cellulitis, rule out osteomyelitis EXAM: RIGHT TIBIA AND FIBULA - 2 VIEW COMPARISON:  12/21/2019 FINDINGS: Knee arthroplasty components project in expected location. Chronic patellar fracture with further distraction of dominant fracture fragments since previous. No acute fracture or dislocation. No effusion. Arterial calcifications in the calf. Calcaneal spur. IMPRESSION: 1. No acute findings. 2. Chronic patellar fracture with further distraction since previous. Electronically Signed   By: Lucrezia Europe M.D.   On: 11/20/2022 13:41   DG Chest 2 View  Result Date: 11/20/2022 CLINICAL DATA:  SOB EXAM: CHEST - 2 VIEW COMPARISON:  10/10/2021 FINDINGS: Enlarged cardiac silhouette. Consolidation or volume loss of the  bases. No pneumothorax. Normal pulmonary vasculature. Left-sided pacemaker. IMPRESSION: Enlarged cardiac silhouette. Bibasilar volume loss or consolidation. Electronically Signed   By: Sammie Bench M.D.   On: 11/20/2022 12:35  Assessment/Plan:  67yo M with COPd, atrial fibrillation, hx of AICD, with past hx of recurrent wounds/cellulitis, admitted for outpatient abtx failure for treatment of cellulitis  - will switch about to cefazolin 2gm IV Q8hr - will check MRSA colonization, for now hold off on vancomycin - keep legs elevated.  Elzie Rings [facility] [phone]

## 2022-11-22 ENCOUNTER — Ambulatory Visit (HOSPITAL_COMMUNITY): Payer: Medicare HMO

## 2022-11-22 ENCOUNTER — Inpatient Hospital Stay (HOSPITAL_COMMUNITY): Payer: Medicare HMO

## 2022-11-22 DIAGNOSIS — G4733 Obstructive sleep apnea (adult) (pediatric): Secondary | ICD-10-CM

## 2022-11-22 DIAGNOSIS — I5020 Unspecified systolic (congestive) heart failure: Secondary | ICD-10-CM | POA: Diagnosis not present

## 2022-11-22 DIAGNOSIS — F419 Anxiety disorder, unspecified: Secondary | ICD-10-CM | POA: Diagnosis not present

## 2022-11-22 DIAGNOSIS — J438 Other emphysema: Secondary | ICD-10-CM | POA: Diagnosis not present

## 2022-11-22 DIAGNOSIS — L03115 Cellulitis of right lower limb: Secondary | ICD-10-CM | POA: Diagnosis not present

## 2022-11-22 LAB — CBC WITH DIFFERENTIAL/PLATELET
Abs Immature Granulocytes: 0.12 10*3/uL — ABNORMAL HIGH (ref 0.00–0.07)
Basophils Absolute: 0 10*3/uL (ref 0.0–0.1)
Basophils Relative: 0 %
Eosinophils Absolute: 0.1 10*3/uL (ref 0.0–0.5)
Eosinophils Relative: 1 %
HCT: 43.1 % (ref 39.0–52.0)
Hemoglobin: 13.6 g/dL (ref 13.0–17.0)
Immature Granulocytes: 1 %
Lymphocytes Relative: 17 %
Lymphs Abs: 1.7 10*3/uL (ref 0.7–4.0)
MCH: 32 pg (ref 26.0–34.0)
MCHC: 31.6 g/dL (ref 30.0–36.0)
MCV: 101.4 fL — ABNORMAL HIGH (ref 80.0–100.0)
Monocytes Absolute: 1 10*3/uL (ref 0.1–1.0)
Monocytes Relative: 10 %
Neutro Abs: 7.2 10*3/uL (ref 1.7–7.7)
Neutrophils Relative %: 71 %
Platelets: 239 10*3/uL (ref 150–400)
RBC: 4.25 MIL/uL (ref 4.22–5.81)
RDW: 12.8 % (ref 11.5–15.5)
WBC: 10.2 10*3/uL (ref 4.0–10.5)
nRBC: 0 % (ref 0.0–0.2)

## 2022-11-22 LAB — COMPREHENSIVE METABOLIC PANEL
ALT: 32 U/L (ref 0–44)
AST: 19 U/L (ref 15–41)
Albumin: 3.4 g/dL — ABNORMAL LOW (ref 3.5–5.0)
Alkaline Phosphatase: 47 U/L (ref 38–126)
Anion gap: 13 (ref 5–15)
BUN: 37 mg/dL — ABNORMAL HIGH (ref 8–23)
CO2: 28 mmol/L (ref 22–32)
Calcium: 9 mg/dL (ref 8.9–10.3)
Chloride: 95 mmol/L — ABNORMAL LOW (ref 98–111)
Creatinine, Ser: 1.21 mg/dL (ref 0.61–1.24)
GFR, Estimated: >60 mL/min (ref >60)
Glucose, Bld: 98 mg/dL (ref 70–99)
Potassium: 3.4 mmol/L — ABNORMAL LOW (ref 3.5–5.1)
Sodium: 136 mmol/L (ref 135–145)
Total Bilirubin: 0.7 mg/dL (ref 0.3–1.2)
Total Protein: 6.6 g/dL (ref 6.5–8.1)

## 2022-11-22 LAB — MAGNESIUM: Magnesium: 2.3 mg/dL (ref 1.7–2.4)

## 2022-11-22 LAB — PHOSPHORUS: Phosphorus: 5.1 mg/dL — ABNORMAL HIGH (ref 2.5–4.6)

## 2022-11-22 MED ORDER — POTASSIUM CHLORIDE CRYS ER 20 MEQ PO TBCR
40.0000 meq | EXTENDED_RELEASE_TABLET | Freq: Two times a day (BID) | ORAL | Status: AC
  Administered 2022-11-22 (×2): 40 meq via ORAL
  Filled 2022-11-22 (×2): qty 2

## 2022-11-22 NOTE — Progress Notes (Signed)
PROGRESS NOTE    [name]  XTK:[id number] DOB: [birth year] DOA: 11/20/2022 PCP: Troy Sine, MD   Brief Narrative:  RANIER COACH is a 67 y.o. male with medical history significant for permanent A-fib s/p AV nodal ablation on Eliquis, HFrEF (EF 40%) and CHB s/p CRT-D, COPD, HTN, HLD, anxiety, OSA intolerant to CPAP who is admitted with cellulitis of the right lower extremity failing outpatient antibiotics.  Right leg remains significantly swollen compared to left and recent DVT scan done and was negative for DVT.  Given failed outpatient antibiotics we have consulted infectious diseases for further evaluation.  Currently he remains on IV Ceftriaxone but ID has now changed Abx to IV Cefazolin.  Assessment and Plan: * Cellulitis of right lower extremity without foot -Failed outpatient antibiotics with p.o. Doxycycline and Clindamycin  hemodynamically stable without fever or leukocytosis.   -Blood cultures in process.  -Recent outpatient Lower extremity Dopplers on 2/2 negative for evidence of DVT. -DG Tib/Fib X-Ray done and showed "No acute findings. Chronic patellar fracture with further distraction since previous." -He was initiated on IV ceftriaxone -Consult ID for further evaluation and recommendations and appreciate their evaluation and they are recommending checking a MRSA colonization and recommending to hold off of vancomycin for now and elevating the legs and switching him to cefazolin 2 g IV every 8 hours -Continue Current Treatment Regimin as he seems to be slowly improving    HFrEF (heart failure with reduced ejection fraction) ([place]) -S/p CRT-D.   -Overall appears compensated aside from unilateral RLE edema in setting of cellulitis.   -Last EF 40% 10/24/2022 and also showed that the left ventricular mildly decreased function with global hypokinesis and the left ventricular diastolic parameters were indeterminate.  Right ventricular systolic function was normal and the right  ventricular size is mildly enlarged and there was normal pulmonary artery systolic pressure -Continue Torsemide 80 mg twice daily -Continue Carvedilol 6.25 mg p.o. twice daily, Losartan 12.5 mg p.o. nightly, Spironolactone 25 mg p.o. nightly, and Torsemide 80 mg p.o. twice daily -Strict I's and O's and daily weights -Patient is down 8 pounds since admission and is -2.401 L -Continue to monitor for signs and symptoms of volume overload and repeat chest x-ray in a.m.   Permanent Atrial Fibrillation (HCC) -S/p AV nodal ablation and CRT-D with V-paced rhythm on admit. -Continue 6.25 mg p.o. twice daily and anticoagulation with apixaban 5 mg p.o. twice daily -If necessary transfer him will place him on telemetry   COPD (Chronic Obstructive Pulmonary disease) ([place]) -Stable without wheezing on admission.   -Continue Trelegy Ellipta substitution of Breo Ellipta and Incruse Ellipta given together 1 puff IH daily and albuterol 2.5 mg every 6 as needed for wheezing/breath as well as DuoNeb 3 mL's every 4 as needed wheezing or shortness of breath -SpO2: 95 % O2 Flow Rate (L/min): 0 L/min -C/w Prednisone 10 mg po Daily  -Continue to monitor respiratory status carefully and if necessary provide supplemental oxygen -DG Chest X-Ray on admission showed "Enlarged cardiac silhouette. Consolidation or volume loss of the bases. No pneumothorax. Normal pulmonary vasculature. Left-sided pacemaker." -Continue with Dupixent in the outpatient setting   Essential Hypertension -Continue Carvedilol 6.25 mg p.o. twice daily, Losartan 12.5 mg p.o. nightly, Spironolactone 25 mg p.o. nightly, and Torsemide 80 mg p.o. twice daily -Continue to Monitor BP per Protocol  -Last BP reading was 112/62   Anxiety and Depression Insomnia -Continue Venlafaxine XR 150 mg po Daily, Zolpidem 10 mg po qHS, Doxepin  10 mg po qH  and home Alprazolam 1 mg po qHS. -Resume home Duloxetine   Benign Prostatic Hyperplasia -Continue  Tamsulosin 0.4 mg po Daily .   Obstructive Sleep Apnea/OHS -Intolerant of CPAP.  Use supplemental O2 as needed.   Leukocytosis, improved  -WBC Trend: Recent Labs  Lab 11/20/22 1205 11/21/22 0538 11/22/22 0454  WBC 9.1 11.0* 10.2  -Likely in the setting of RLE Cellulitis -C/w Antibiotics as above but will have ID evaluate  -LA was 1.2 -Continue to Monitor and Trend WBC Count and repeat CBC in the AM    Hypoalbuminemia -Patient's Albumin Trend: Recent Labs  Lab 11/20/22 1205 11/22/22 0454  ALBUMIN 3.2* 3.4*  -Continue to Monitor and Trend and repeat CMP in the AM  Obesity -Complicates overall prognosis and care -Estimated body mass index is 35.9 kg/m as calculated from the following:   Height as of this encounter: '5\' 10"'$  (1.778 m).   Weight as of this encounter: 113.5 kg.  -Weight Loss and Dietary Counseling given  DVT prophylaxis:  apixaban (ELIQUIS) tablet 5 mg    Code Status: Full Code Family Communication: No family currently at bedside  Disposition Plan:  Level of care: Med-Surg Status is: Inpatient Remains inpatient appropriate because: Continues to be on IV antibiotics is improving slowly.   Consultants:  Infectious diseases  Procedures:  As delineated as above  Antimicrobials:  Anti-infectives (From admission, onward)    Start     Dose/Rate Route Frequency Ordered Stop   11/21/22 1515  ceFAZolin (ANCEF) IVPB 2g/100 mL premix        2 g 200 mL/hr over 30 Minutes Intravenous Every 8 hours 11/21/22 1427     11/20/22 1330  cefTRIAXone (ROCEPHIN) 2 g in sodium chloride 0.9 % 100 mL IVPB  Status:  Discontinued       See Hyperspace for full Linked Orders Report.   2 g 200 mL/hr over 30 Minutes Intravenous Every 24 hours 11/20/22 1323 11/21/22 1427   11/20/22 1330  metroNIDAZOLE (FLAGYL) IVPB 500 mg  Status:  Discontinued       See Hyperspace for full Linked Orders Report.   500 mg 100 mL/hr over 60 Minutes Intravenous Every 12 hours 11/20/22 1323  11/20/22 1941        Subjective: Seen and examined at bedside he is doing better he thinks in his leg is not as swollen today compared to yesterday.  Thinks he is improving but continues to have some pain.  No nausea or vomiting.  No other concerns or complaints this time and states he had a bowel movement earlier.  Objective: Vitals:   11/21/22 1441 11/21/22 2243 11/22/22 0437 11/22/22 0500  BP: 104/64 110/73 112/62   Pulse: 69 72 70   Resp: '18 18 19   '$ Temp: 97.7 F (36.5 C) 98 F (36.7 C) 97.7 F (36.5 C)   TempSrc: Oral  Oral   SpO2: 99% 98% 95%   Weight:    113.5 kg  Height:        Intake/Output Summary (Last 24 hours) at 11/22/2022 1237 Last data filed at 11/22/2022 0900 Gross per 24 hour  Intake 360 ml  Output 300 ml  Net 60 ml   Filed Weights   11/20/22 1351 11/21/22 0614 11/22/22 0500  Weight: 126.1 kg 122.8 kg 113.5 kg   Examination: Physical Exam:  Constitutional: WN/WD obese Caucasian male currently no acute distress Respiratory: Diminished to auscultation bilaterally with coarse breath sounds, no wheezing, rales, rhonchi or crackles.  Normal respiratory effort and patient is not tachypenic. No accessory muscle use.  Unlabored breathing Cardiovascular: RRR, no murmurs / rubs / gallops. S1 and S2 auscultated.  1+ lower extremity edema worse on the right compared to left Abdomen: Soft, non-tender, distended secondary to body habitus. Bowel sounds positive.  GU: Deferred. Musculoskeletal: No clubbing / cyanosis of digits/nails. No joint deformity upper and lower extremities.  Skin: No rashes, lesions, ulcers limited skin evaluation. No induration; Warm and dry.  Neurologic: CN 2-12 grossly intact with no focal deficits. Romberg sign and cerebellar reflexes not assessed.  Psychiatric: Normal judgment and insight. Alert and oriented x 3. Normal mood and appropriate affect.   Data Reviewed: I have personally reviewed following labs and imaging studies  CBC: Recent  Labs  Lab 11/20/22 1205 11/21/22 0538 11/22/22 0454  WBC 9.1 11.0* 10.2  NEUTROABS 6.9  --  7.2  HGB 13.1 14.8 13.6  HCT 41.2 46.6 43.1  MCV 100.0 102.9* 101.4*  PLT 236 260 664   Basic Metabolic Panel: Recent Labs  Lab 11/20/22 1205 11/21/22 0538 11/22/22 0454  NA 134* 136 136  K 4.1 4.2 3.4*  CL 106 99 95*  CO2 '22 28 28  '$ GLUCOSE 97 85 98  BUN 26* 24* 37*  CREATININE 1.07 0.95 1.21  CALCIUM 8.5* 9.0 9.0  MG  --   --  2.3  PHOS  --   --  5.1*   GFR: Estimated Creatinine Clearance: 74.7 mL/min (by C-G formula based on SCr of 1.21 mg/dL). Liver Function Tests: Recent Labs  Lab 11/20/22 1205 11/22/22 0454  AST 26 19  ALT 48* 32  ALKPHOS 50 47  BILITOT 0.6 0.7  PROT 6.7 6.6  ALBUMIN 3.2* 3.4*   No results for input(s): "LIPASE", "AMYLASE" in the last 168 hours. No results for input(s): "AMMONIA" in the last 168 hours. Coagulation Profile: No results for input(s): "INR", "PROTIME" in the last 168 hours. Cardiac Enzymes: No results for input(s): "CKTOTAL", "CKMB", "CKMBINDEX", "TROPONINI" in the last 168 hours. BNP (last 3 results) No results for input(s): "PROBNP" in the last 8760 hours. HbA1C: No results for input(s): "HGBA1C" in the last 72 hours. CBG: No results for input(s): "GLUCAP" in the last 168 hours. Lipid Profile: No results for input(s): "CHOL", "HDL", "LDLCALC", "TRIG", "CHOLHDL", "LDLDIRECT" in the last 72 hours. Thyroid Function Tests: No results for input(s): "TSH", "T4TOTAL", "FREET4", "T3FREE", "THYROIDAB" in the last 72 hours. Anemia Panel: No results for input(s): "VITAMINB12", "FOLATE", "FERRITIN", "TIBC", "IRON", "RETICCTPCT" in the last 72 hours. Sepsis Labs: Recent Labs  Lab 11/20/22 1334  LATICACIDVEN 1.2    Recent Results (from the past 240 hour(s))  Blood Cultures x 2 sites     Status: None (Preliminary result)   Collection Time: 11/20/22  1:34 PM   Specimen: BLOOD LEFT FOREARM  Result Value Ref Range Status   Specimen  Description   Final    BLOOD LEFT FOREARM Performed at [facility], [place] [address], [place], [place] [address]    Special Requests   Final    BOTTLES DRAWN AEROBIC AND ANAEROBIC Blood Culture adequate volume Performed at [facility], [address] [place], [place] [address]    Culture   Final    NO GROWTH 2 DAYS Performed at [facility], [place] [address] [place], [place] [address]    Report Status PENDING  Incomplete  Blood Cultures x 2 sites     Status: None (Preliminary result)  Collection Time: 11/20/22  1:47 PM   Specimen: BLOOD  Result Value Ref Range Status   Specimen Description   Final    BLOOD RIGHT ANTECUBITAL Performed at [facility], [address] [place], [place] [address]    Special Requests   Final    BOTTLES DRAWN AEROBIC AND ANAEROBIC Blood Culture adequate volume Performed at [facility], [address] [place], [place] [address]    Culture   Final    NO GROWTH 2 DAYS Performed at [facility], [place] [address] [place], [place] [address]    Report Status PENDING  Incomplete  MRSA Next Gen by PCR, Nasal     Status: Abnormal   Collection Time: 11/21/22  3:46 PM   Specimen: Nasal Mucosa; Nasal Swab  Result Value Ref Range Status   MRSA by PCR Next Gen DETECTED (A) NOT DETECTED Final    Comment: (NOTE) The GeneXpert MRSA Assay (FDA approved for NASAL specimens only), is one component of a comprehensive MRSA colonization surveillance program. It is not intended to diagnose MRSA infection nor to guide or monitor treatment for MRSA infections. Test performance is not FDA approved in patients less than 35 years old. Performed at [facility], [place] [address], [place], [place] [address]     Radiology Studies: DG CHEST PORT 1 VIEW  Result Date: 11/22/2022 CLINICAL DATA:  [id number] with shortness of breath. EXAM: PORTABLE CHEST 1 VIEW COMPARISON:  PA Lat 11/20/2022 FINDINGS: 4:34 a.m. Left  chest dual lead pacing system with AID wiring is unchanged. Stable moderate cardiomegaly. Central vessels are normal caliber. The lower lung fields are obscured by expiratory lung volumes. A basilar infiltrate or small effusion could be hidden. The mid to upper lung fields are clear. There is a stable mediastinal configuration accounting for exploration. No new osseous findings. Overall study is recommended in full inspiration. In all other respects there are no further changes. IMPRESSION: Limited study due to expiratory technique. The lower lung fields are obscured. A basilar infiltrate or effusion could be hidden. Recommend full inspiration PA and lateral views. The visualized lungs are clear. Electronically Signed   By: Telford Nab M.D.   On: 11/22/2022 07:11   DG Tibia/Fibula Right  Result Date: 11/20/2022 CLINICAL DATA:  Cellulitis, rule out osteomyelitis EXAM: RIGHT TIBIA AND FIBULA - 2 VIEW COMPARISON:  12/21/2019 FINDINGS: Knee arthroplasty components project in expected location. Chronic patellar fracture with further distraction of dominant fracture fragments since previous. No acute fracture or dislocation. No effusion. Arterial calcifications in the calf. Calcaneal spur. IMPRESSION: 1. No acute findings. 2. Chronic patellar fracture with further distraction since previous. Electronically Signed   By: Lucrezia Europe M.D.   On: 11/20/2022 13:41    Scheduled Meds:  ALPRAZolam  1 mg Oral QHS   apixaban  5 mg Oral BID   carvedilol  6.25 mg Oral BID   dapagliflozin propanediol  10 mg Oral QAC breakfast   doxepin  20 mg Oral QHS   DULoxetine  60 mg Oral Daily   fluticasone furoate-vilanterol  1 puff Inhalation Daily   And   umeclidinium bromide  1 puff Inhalation Daily   losartan  12.5 mg Oral QHS   pantoprazole  40 mg Oral Daily   potassium chloride  40 mEq Oral BID   predniSONE  10 mg Oral Q breakfast   spironolactone  25 mg Oral Daily   tamsulosin  0.4 mg Oral Daily  torsemide  80 mg  Oral BID   venlafaxine XR  150 mg Oral Q breakfast   zolpidem  10 mg Oral QHS   Continuous Infusions:   ceFAZolin (ANCEF) IV 2 g (11/22/22 0554)    LOS: 1 day   Raiford Noble, DO [facility] Available via Epic secure chat 7am-7pm After these hours, please refer to coverage provider listed on [email or web address] 11/22/2022, 12:37 PM

## 2022-11-22 NOTE — Progress Notes (Signed)
PT Cancellation Note  Patient Details Name: [name] MRN: [id number] DOB: 1956/06/21   Cancelled Treatment:    Reason Eval/Treat Not Completed: PT screened, no needs identified, will sign off  [place] Office [phone] Weekend pager-[phone]  Claretha Cooper 11/22/2022, 10:53 AM

## 2022-11-22 NOTE — Progress Notes (Signed)
  Transition of Care [facility]) Screening Note   Patient Details  Name: HASSEL UPHOFF Date of Birth: 1956/01/11   Transition of Care [facility]) CM/SW Contact:    Vassie Moselle, LCSW Phone Number: 11/22/2022, 1:34 PM    Transition of Care Department [facility]) has reviewed patient and no TOC needs have been identified at this time. We will continue to monitor patient advancement through interdisciplinary progression rounds. If new patient transition needs arise, please place a TOC consult.

## 2022-11-23 ENCOUNTER — Encounter: Payer: Self-pay | Admitting: Cardiology

## 2022-11-23 DIAGNOSIS — L03115 Cellulitis of right lower limb: Secondary | ICD-10-CM | POA: Diagnosis not present

## 2022-11-23 LAB — COMPREHENSIVE METABOLIC PANEL
ALT: 17 U/L (ref 0–44)
AST: 20 U/L (ref 15–41)
Albumin: 3.3 g/dL — ABNORMAL LOW (ref 3.5–5.0)
Alkaline Phosphatase: 51 U/L (ref 38–126)
Anion gap: 8 (ref 5–15)
BUN: 46 mg/dL — ABNORMAL HIGH (ref 8–23)
CO2: 30 mmol/L (ref 22–32)
Calcium: 9 mg/dL (ref 8.9–10.3)
Chloride: 98 mmol/L (ref 98–111)
Creatinine, Ser: 1.27 mg/dL — ABNORMAL HIGH (ref 0.61–1.24)
GFR, Estimated: >60 mL/min (ref >60)
Glucose, Bld: 120 mg/dL — ABNORMAL HIGH (ref 70–99)
Potassium: 3.8 mmol/L (ref 3.5–5.1)
Sodium: 136 mmol/L (ref 135–145)
Total Bilirubin: 0.5 mg/dL (ref 0.3–1.2)
Total Protein: 7.1 g/dL (ref 6.5–8.1)

## 2022-11-23 LAB — CBC WITH DIFFERENTIAL/PLATELET
Abs Immature Granulocytes: 0.1 10*3/uL — ABNORMAL HIGH (ref 0.00–0.07)
Basophils Absolute: 0 10*3/uL (ref 0.0–0.1)
Basophils Relative: 0 %
Eosinophils Absolute: 0.1 10*3/uL (ref 0.0–0.5)
Eosinophils Relative: 1 %
HCT: 43.9 % (ref 39.0–52.0)
Hemoglobin: 13.8 g/dL (ref 13.0–17.0)
Immature Granulocytes: 1 %
Lymphocytes Relative: 12 %
Lymphs Abs: 1.2 10*3/uL (ref 0.7–4.0)
MCH: 32.2 pg (ref 26.0–34.0)
MCHC: 31.4 g/dL (ref 30.0–36.0)
MCV: 102.6 fL — ABNORMAL HIGH (ref 80.0–100.0)
Monocytes Absolute: 0.7 10*3/uL (ref 0.1–1.0)
Monocytes Relative: 7 %
Neutro Abs: 7.7 10*3/uL (ref 1.7–7.7)
Neutrophils Relative %: 79 %
Platelets: 275 10*3/uL (ref 150–400)
RBC: 4.28 MIL/uL (ref 4.22–5.81)
RDW: 12.7 % (ref 11.5–15.5)
WBC: 9.7 10*3/uL (ref 4.0–10.5)
nRBC: 0 % (ref 0.0–0.2)

## 2022-11-23 LAB — MAGNESIUM: Magnesium: 2.6 mg/dL — ABNORMAL HIGH (ref 1.7–2.4)

## 2022-11-23 LAB — PHOSPHORUS: Phosphorus: 4.5 mg/dL (ref 2.5–4.6)

## 2022-11-23 MED ORDER — MUPIROCIN 2 % EX OINT
1.0000 | TOPICAL_OINTMENT | Freq: Two times a day (BID) | CUTANEOUS | Status: DC
  Administered 2022-11-23 – 2022-11-24 (×3): 1 via NASAL
  Filled 2022-11-23 (×3): qty 22

## 2022-11-23 MED ORDER — CHLORHEXIDINE GLUCONATE CLOTH 2 % EX PADS
6.0000 | MEDICATED_PAD | Freq: Every day | CUTANEOUS | Status: DC
  Administered 2022-11-23 – 2022-11-24 (×2): 6 via TOPICAL

## 2022-11-23 MED ORDER — CEFADROXIL 500 MG PO CAPS
1000.0000 mg | ORAL_CAPSULE | Freq: Two times a day (BID) | ORAL | Status: DC
  Administered 2022-11-23 – 2022-11-24 (×3): 1000 mg via ORAL
  Filled 2022-11-23 (×4): qty 2

## 2022-11-23 NOTE — Progress Notes (Signed)
[facility] for Infectious Disease    Date of Admission:  11/20/2022   Total days of antibiotics 4         ID: [name] is a 67 y.o. male with  cellulitis of right leg Principal Problem:   Cellulitis of right lower extremity without foot Active Problems:   Obstructive sleep apnea   Essential hypertension   Benign prostatic hyperplasia   COPD (chronic obstructive pulmonary disease) (HCC)   HFrEF (heart failure with reduced ejection fraction) (HCC)   Permanent atrial fibrillation (HCC)   Anxiety and depression   Cellulitis and abscess of leg, except foot    Subjective: Afebrile, less tenderness and swelling to right leg and less redness  Medications:   ALPRAZolam  1 mg Oral QHS   apixaban  5 mg Oral BID   carvedilol  6.25 mg Oral BID   cefadroxil  1,000 mg Oral BID   Chlorhexidine Gluconate Cloth  6 each Topical Q0600   dapagliflozin propanediol  10 mg Oral QAC breakfast   doxepin  20 mg Oral QHS   DULoxetine  60 mg Oral Daily   fluticasone furoate-vilanterol  1 puff Inhalation Daily   And   umeclidinium bromide  1 puff Inhalation Daily   losartan  12.5 mg Oral QHS   mupirocin ointment  1 Application Nasal BID   pantoprazole  40 mg Oral Daily   predniSONE  10 mg Oral Q breakfast   spironolactone  25 mg Oral Daily   tamsulosin  0.4 mg Oral Daily   torsemide  80 mg Oral BID   venlafaxine XR  150 mg Oral Q breakfast   zolpidem  10 mg Oral QHS    Objective: Vital signs in last 24 hours: Temp:  [97.7 F (36.5 C)-98.1 F (36.7 C)] 98 F (36.7 C) (02/09 1327) Pulse Rate:  [68-70] 69 (02/09 1327) Resp:  [16-19] 18 (02/09 1327) BP: (90-116)/(69-76) 116/73 (02/09 1327) SpO2:  [95 %-99 %] 97 % (02/09 1327) Weight:  [122.3 kg] 122.3 kg (02/08 2128)  Physical Exam  Constitutional: He is oriented to person, place, and time. He appears well-developed and well-nourished. No distress.  HENT:  Mouth/Throat: Oropharynx is clear and moist. No oropharyngeal exudate.   Cardiovascular: Normal rate, regular rhythm and normal heart sounds. Exam reveals no gallop and no friction rub.  No murmur heard.  Pulmonary/Chest: Effort normal and breath sounds normal. No respiratory distress. He has no wheezes.  Abdominal: Soft. Bowel sounds are normal. He exhibits no distension. There is no tenderness.  Lymphadenopathy:  He has no cervical adenopathy.  Neurological: He is alert and oriented to person, place, and time.  Skin: Skin is warm and dry. No rash noted. No erythema. Less erythema, and edema is decreased but still + 1 pitting edema Psychiatric: He has a normal mood and affect. His behavior is normal.    Lab Results Recent Labs    11/22/22 0454 11/23/22 0945  WBC 10.2 9.7  HGB 13.6 13.8  HCT 43.1 43.9  NA 136 136  K 3.4* 3.8  CL 95* 98  CO2 28 30  BUN 37* 46*  CREATININE 1.21 1.27*   Liver Panel Recent Labs    11/22/22 0454 11/23/22 0945  PROT 6.6 7.1  ALBUMIN 3.4* 3.3*  AST 19 20  ALT 32 17  ALKPHOS 47 51  BILITOT 0.7 0.5   Sedimentation Rate No results for input(s): "ESRSEDRATE" in the last 72 hours. C-Reactive Protein No results for input(s): "  CRP" in the last 72 hours.  Microbiology:  Studies/Results: DG CHEST PORT 1 VIEW  Result Date: 11/22/2022 CLINICAL DATA:  [id number] with shortness of breath. EXAM: PORTABLE CHEST 1 VIEW COMPARISON:  PA Lat 11/20/2022 FINDINGS: 4:34 a.m. Left chest dual lead pacing system with AID wiring is unchanged. Stable moderate cardiomegaly. Central vessels are normal caliber. The lower lung fields are obscured by expiratory lung volumes. A basilar infiltrate or small effusion could be hidden. The mid to upper lung fields are clear. There is a stable mediastinal configuration accounting for exploration. No new osseous findings. Overall study is recommended in full inspiration. In all other respects there are no further changes. IMPRESSION: Limited study due to expiratory technique. The lower lung fields are  obscured. A basilar infiltrate or effusion could be hidden. Recommend full inspiration PA and lateral views. The visualized lungs are clear. Electronically Signed   By: Telford Nab M.D.   On: 11/22/2022 07:11     Assessment/Plan: Cellulitis = finish out course of with cefadroxil 1068m po bid x 8 days. Will discontinue cefazolin  Leukocytosis = now back to baseline  Lower extremity edema = improved with increase to diuretics  Will sign off. From ID standpoint can discharge home.  [facility] Pager: [phone]  11/23/2022, 3:56 PM

## 2022-11-23 NOTE — Plan of Care (Signed)
  Problem: Education: Goal: Knowledge of General Education information will improve Description: Including pain rating scale, medication(s)/side effects and non-pharmacologic comfort measures Outcome: Progressing   Problem: Health Behavior/Discharge Planning: Goal: Ability to manage health-related needs will improve Outcome: Progressing   Problem: Clinical Measurements: Goal: Ability to maintain clinical measurements within normal limits will improve Outcome: Progressing Goal: Diagnostic test results will improve Outcome: Progressing   Problem: Activity: Goal: Risk for activity intolerance will decrease Outcome: Progressing   Problem: Nutrition: Goal: Adequate nutrition will be maintained Outcome: Progressing   Problem: Pain Managment: Goal: General experience of comfort will improve Outcome: Progressing   Problem: Skin Integrity: Goal: Risk for impaired skin integrity will decrease Outcome: Progressing   Problem: Clinical Measurements: Goal: Ability to avoid or minimize complications of infection will improve Outcome: Progressing   Problem: Skin Integrity: Goal: Skin integrity will improve Outcome: Progressing

## 2022-11-23 NOTE — Progress Notes (Signed)
PROGRESS NOTE    [name]  [id number] DOB: [birth year] DOA: 11/20/2022 PCP: Troy Sine, MD   Brief Narrative:  [name] is a 67 y.o. male with medical history significant for permanent A-fib s/p AV nodal ablation on Eliquis, HFrEF (EF 40%) and CHB s/p CRT-D, COPD, HTN, HLD, anxiety, OSA intolerant to CPAP who is admitted with cellulitis of the right lower extremity failing outpatient antibiotics.  Right leg remains significantly swollen compared to left and recent DVT scan done and was negative for DVT.  Given failed outpatient antibiotics we have consulted infectious diseases for further evaluation.  Currently he remains on IV Ceftriaxone but ID has now changed Abx to IV Cefazolin further changing to p.o. cefadroxil.  Will observe him overnight on new antibiotics and discharge in the a.m. if well-tolerated given that he did fail outpatient antibiotics.   Assessment and Plan: * Cellulitis of right lower extremity without foot -Failed outpatient antibiotics with p.o. Doxycycline and Clindamycin  hemodynamically stable without fever or leukocytosis.   -Blood cultures in process.  -Recent outpatient Lower extremity Dopplers on 2/2 negative for evidence of DVT. -DG Tib/Fib X-Ray done and showed "No acute findings. Chronic patellar fracture with further distraction since previous." -He was initiated on IV ceftriaxone -Consult ID for further evaluation and recommendations and appreciate their evaluation and they are recommending checking a MRSA colonization and recommending to hold off of vancomycin for now and elevating the legs and switching him to cefazolin 2 g IV every 8 hours now meropenem switch him to p.o. cefadroxil 1000 mg twice daily for 8 more days; will give the first dose tonight and observe and anticipate discharging home in next 24 to 48 hours   HFrEF (heart failure with reduced ejection fraction) ([place]) -S/p CRT-D.   -Overall appears compensated aside from unilateral  RLE edema in setting of cellulitis.   -Last EF 40% 10/24/2022 and also showed that the left ventricular mildly decreased function with global hypokinesis and the left ventricular diastolic parameters were indeterminate.  Right ventricular systolic function was normal and the right ventricular size is mildly enlarged and there was normal pulmonary artery systolic pressure -Continue Torsemide 80 mg twice daily -Continue Carvedilol 6.25 mg p.o. twice daily, Losartan 12.5 mg p.o. nightly, Spironolactone 25 mg p.o. nightly, and Torsemide 80 mg p.o. twice daily -Strict I's and O's and daily weights -Patient is down 8 pounds since admission and is -1.2814 L -Continue to monitor for signs and symptoms of volume overload and repeat chest x-ray in a.m.   Permanent Atrial Fibrillation (HCC) -S/p AV nodal ablation and CRT-D with V-paced rhythm on admit. -Continue 6.25 mg p.o. twice daily and anticoagulation with apixaban 5 mg p.o. twice daily -If necessary transfer him will place him on telemetry   COPD (Chronic Obstructive Pulmonary disease) ([place]) -Stable without wheezing on admission.   -Continue Trelegy Ellipta substitution of Breo Ellipta and Incruse Ellipta given together 1 puff IH daily and albuterol 2.5 mg every 6 as needed for wheezing/breath as well as DuoNeb 3 mL's every 4 as needed wheezing or shortness of breath -SpO2: 97 % O2 Flow Rate (L/min): 0 L/min -C/w Prednisone 10 mg po Daily  -Continue to monitor respiratory status carefully and if necessary provide supplemental oxygen -DG Chest X-Ray on admission showed "Enlarged cardiac silhouette. Consolidation or volume loss of the bases. No pneumothorax. Normal pulmonary vasculature. Left-sided pacemaker." -Continue with Dupixent in the outpatient setting   Essential Hypertension -Continue Carvedilol 6.25 mg p.o. twice  daily, Losartan 12.5 mg p.o. nightly, Spironolactone 25 mg p.o. nightly, and Torsemide 80 mg p.o. twice daily -Continue to  Monitor BP per Protocol  -Last BP reading was 116/73   Anxiety and Depression Insomnia -Continue Venlafaxine XR 150 mg po Daily, Zolpidem 10 mg po qHS, Doxepin 10 mg po qH  and home Alprazolam 1 mg po qHS. -Resume home Duloxetine   Benign Prostatic Hyperplasia -Continue Tamsulosin 0.4 mg po Daily .   Obstructive Sleep Apnea/OHS -Intolerant of CPAP.  Use supplemental O2 as needed.  Renal Insuffiencey -BUN/Cr Trend: Recent Labs  Lab 11/20/22 1205 11/21/22 0538 11/22/22 0454 11/23/22 0945  BUN 26* 24* 37* 46*  CREATININE 1.07 0.95 1.21 1.27*  -Avoid Nephrotoxic Medications, Contrast Dyes, Hypotension and Dehydration to Ensure Adequate Renal Perfusion and will need to Renally Adjust Meds -Continue to Monitor and Trend Renal Function carefully and repeat CMP in the AM    Leukocytosis, improved  -WBC Trend: Recent Labs  Lab 11/20/22 1205 11/21/22 0538 11/22/22 0454 11/23/22 0945  WBC 9.1 11.0* 10.2 9.7  -Likely in the setting of RLE Cellulitis and improved  -C/w Antibiotics as above but will have ID evaluate  -LA was 1.2 -Continue to Monitor and Trend WBC Count and repeat CBC in the AM    Hypoalbuminemia -Patient's Albumin Trend: Recent Labs  Lab 11/20/22 1205 11/22/22 0454 11/23/22 0945  ALBUMIN 3.2* 3.4* 3.3*  -Continue to Monitor and Trend and repeat CMP in the AM  Obesity -Complicates overall prognosis and care -Estimated body mass index is 38.7 kg/m as calculated from the following:   Height as of this encounter: 5' 10"$  (1.778 m).   Weight as of this encounter: 122.3 kg.  -Weight Loss and Dietary Counseling given  DVT prophylaxis:  apixaban (ELIQUIS) tablet 5 mg    Code Status: Full Code Family Communication: No family present at bedside  Disposition Plan:  Level of care: Med-Surg Status is: Inpatient Remains inpatient appropriate because: Will observe him tonight on oral antibiotics which and anticipate discharging home in next 24 to 48 hours given  that he did fail outpatient antibiotics previously   Consultants:  Infectious Diseases  Procedures:  As delineated as above  Antimicrobials:  Anti-infectives (From admission, onward)    Start     Dose/Rate Route Frequency Ordered Stop   11/23/22 1245  cefadroxil (DURICEF) capsule 1,000 mg        1,000 mg Oral 2 times daily 11/23/22 1147     11/21/22 1515  ceFAZolin (ANCEF) IVPB 2g/100 mL premix  Status:  Discontinued        2 g 200 mL/hr over 30 Minutes Intravenous Every 8 hours 11/21/22 1427 11/23/22 1147   11/20/22 1330  cefTRIAXone (ROCEPHIN) 2 g in sodium chloride 0.9 % 100 mL IVPB  Status:  Discontinued       See Hyperspace for full Linked Orders Report.   2 g 200 mL/hr over 30 Minutes Intravenous Every 24 hours 11/20/22 1323 11/21/22 1427   11/20/22 1330  metroNIDAZOLE (FLAGYL) IVPB 500 mg  Status:  Discontinued       See Hyperspace for full Linked Orders Report.   500 mg 100 mL/hr over 60 Minutes Intravenous Every 12 hours 11/20/22 1323 11/20/22 1941       Subjective: Seen and examined at bedside and thinks he is doing better.  Leg is not as swollen today and still little painful but not as bad.  He states the weeping is stopped now.  No other concerns  or complaints at this time.  Objective: Vitals:   11/23/22 0423 11/23/22 0750 11/23/22 1021 11/23/22 1327  BP: 90/73  103/69 116/73  Pulse: 70  68 69  Resp: 16  19 18  $ Temp: 97.9 F (36.6 C)  97.7 F (36.5 C) 98 F (36.7 C)  TempSrc: Oral  Oral Oral  SpO2: 98% 98% 95% 97%  Weight:      Height:        Intake/Output Summary (Last 24 hours) at 11/23/2022 1734 Last data filed at 11/23/2022 1606 Gross per 24 hour  Intake 1120 ml  Output --  Net 1120 ml   Filed Weights   11/21/22 0614 11/22/22 0500 11/22/22 2128  Weight: 122.8 kg 113.5 kg 122.3 kg   Examination: Physical Exam:  Constitutional: WN/WD obese Caucasian male in NAD appears calm Respiratory: Diminished to auscultation bilaterally, no wheezing,  rales, rhonchi or crackles. Normal respiratory effort and patient is not tachypenic. No accessory muscle use. Unlabored breathing  Cardiovascular: RRR, no murmurs / rubs / gallops. S1 and S2 auscultated. Has 1+ LE edema  Abdomen: Soft, non-tender, Distended 2/2 body habitus. Bowel sounds positive.  GU: Deferred. Musculoskeletal: No clubbing / cyanosis of digits/nails. No joint deformity upper and lower extremities. Skin: Right leg is erythematous but is warm and is improving in coloration and no longer weeping. Neurologic: CN 2-12 grossly intact with no focal deficits. Romberg sign and cerebellar reflexes not assessed.  Psychiatric: Normal judgment and insight. Alert and oriented x 3. Normal mood and appropriate affect.   Data Reviewed: I have personally reviewed following labs and imaging studies  CBC: Recent Labs  Lab 11/20/22 1205 11/21/22 0538 11/22/22 0454 11/23/22 0945  WBC 9.1 11.0* 10.2 9.7  NEUTROABS 6.9  --  7.2 7.7  HGB 13.1 14.8 13.6 13.8  HCT 41.2 46.6 43.1 43.9  MCV 100.0 102.9* 101.4* 102.6*  PLT 236 260 239 [id number]   Basic Metabolic Panel: Recent Labs  Lab 11/20/22 1205 11/21/22 0538 11/22/22 0454 11/23/22 0945  NA 134* 136 136 136  K 4.1 4.2 3.4* 3.8  CL 106 99 95* 98  CO2 22 28 28 30  $ GLUCOSE 97 85 98 120*  BUN 26* 24* 37* 46*  CREATININE 1.07 0.95 1.21 1.27*  CALCIUM 8.5* 9.0 9.0 9.0  MG  --   --  2.3 2.6*  PHOS  --   --  5.1* 4.5   GFR: Estimated Creatinine Clearance: 74 mL/min (A) (by C-G formula based on SCr of 1.27 mg/dL (H)). Liver Function Tests: Recent Labs  Lab 11/20/22 1205 11/22/22 0454 11/23/22 0945  AST 26 19 20  $ ALT 48* 32 17  ALKPHOS 50 47 51  BILITOT 0.6 0.7 0.5  PROT 6.7 6.6 7.1  ALBUMIN 3.2* 3.4* 3.3*   No results for input(s): "LIPASE", "AMYLASE" in the last 168 hours. No results for input(s): "AMMONIA" in the last 168 hours. Coagulation Profile: No results for input(s): "INR", "PROTIME" in the last 168 hours. Cardiac  Enzymes: No results for input(s): "CKTOTAL", "CKMB", "CKMBINDEX", "TROPONINI" in the last 168 hours. BNP (last 3 results) No results for input(s): "PROBNP" in the last 8760 hours. HbA1C: No results for input(s): "HGBA1C" in the last 72 hours. CBG: No results for input(s): "GLUCAP" in the last 168 hours. Lipid Profile: No results for input(s): "CHOL", "HDL", "LDLCALC", "TRIG", "CHOLHDL", "LDLDIRECT" in the last 72 hours. Thyroid Function Tests: No results for input(s): "TSH", "T4TOTAL", "FREET4", "T3FREE", "THYROIDAB" in the last 72 hours. Anemia Panel: No results  for input(s): "VITAMINB12", "FOLATE", "FERRITIN", "TIBC", "IRON", "RETICCTPCT" in the last 72 hours. Sepsis Labs: Recent Labs  Lab 11/20/22 1334  LATICACIDVEN 1.2    Recent Results (from the past 240 hour(s))  Blood Cultures x 2 sites     Status: None (Preliminary result)   Collection Time: 11/20/22  1:34 PM   Specimen: BLOOD LEFT FOREARM  Result Value Ref Range Status   Specimen Description   Final    BLOOD LEFT FOREARM Performed at [facility], [place] [address], [place], [place] [address]    Special Requests   Final    BOTTLES DRAWN AEROBIC AND ANAEROBIC Blood Culture adequate volume Performed at [facility], [address] [place], [place] [address]    Culture   Final    NO GROWTH 3 DAYS Performed at [facility], [place] [address] [place], [place] [address]    Report Status PENDING  Incomplete  Blood Cultures x 2 sites     Status: None (Preliminary result)   Collection Time: 11/20/22  1:47 PM   Specimen: BLOOD  Result Value Ref Range Status   Specimen Description   Final    BLOOD RIGHT ANTECUBITAL Performed at [facility], [address] [place], [place] [address]    Special Requests   Final    BOTTLES DRAWN AEROBIC AND ANAEROBIC Blood Culture adequate volume Performed at [facility], [address] [place], [place] [address]    Culture   Final    NO  GROWTH 3 DAYS Performed at [facility], [place] [address] [place], [place] [address]    Report Status PENDING  Incomplete  MRSA Next Gen by PCR, Nasal     Status: Abnormal   Collection Time: 11/21/22  3:46 PM   Specimen: Nasal Mucosa; Nasal Swab  Result Value Ref Range Status   MRSA by PCR Next Gen DETECTED (A) NOT DETECTED Final    Comment: (NOTE) The GeneXpert MRSA Assay (FDA approved for NASAL specimens only), is one component of a comprehensive MRSA colonization surveillance program. It is not intended to diagnose MRSA infection nor to guide or monitor treatment for MRSA infections. Test performance is not FDA approved in patients less than 32 years old. Performed at [facility], [place] [address] [place], [place] [address]     Radiology Studies: DG CHEST PORT 1 VIEW  Result Date: 11/22/2022 CLINICAL DATA:  [id number] with shortness of breath. EXAM: PORTABLE CHEST 1 VIEW COMPARISON:  PA Lat 11/20/2022 FINDINGS: 4:34 a.m. Left chest dual lead pacing system with AID wiring is unchanged. Stable moderate cardiomegaly. Central vessels are normal caliber. The lower lung fields are obscured by expiratory lung volumes. A basilar infiltrate or small effusion could be hidden. The mid to upper lung fields are clear. There is a stable mediastinal configuration accounting for exploration. No new osseous findings. Overall study is recommended in full inspiration. In all other respects there are no further changes. IMPRESSION: Limited study due to expiratory technique. The lower lung fields are obscured. A basilar infiltrate or effusion could be hidden. Recommend full inspiration PA and lateral views. The visualized lungs are clear. Electronically Signed   By: Telford Nab M.D.   On: 11/22/2022 07:11    Scheduled Meds:  ALPRAZolam  1 mg Oral QHS   apixaban  5 mg Oral BID   carvedilol  6.25 mg Oral BID   cefadroxil  1,000 mg Oral BID  Chlorhexidine Gluconate Cloth  6 each  Topical Q0600   dapagliflozin propanediol  10 mg Oral QAC breakfast   doxepin  20 mg Oral QHS   DULoxetine  60 mg Oral Daily   fluticasone furoate-vilanterol  1 puff Inhalation Daily   And   umeclidinium bromide  1 puff Inhalation Daily   losartan  12.5 mg Oral QHS   mupirocin ointment  1 Application Nasal BID   pantoprazole  40 mg Oral Daily   predniSONE  10 mg Oral Q breakfast   spironolactone  25 mg Oral Daily   tamsulosin  0.4 mg Oral Daily   torsemide  80 mg Oral BID   venlafaxine XR  150 mg Oral Q breakfast   zolpidem  10 mg Oral QHS   Continuous Infusions:   LOS: 2 days   Raiford Noble, DO [facility] Available via Epic secure chat 7am-7pm After these hours, please refer to coverage provider listed on [email or web address] 11/23/2022, 5:34 PM

## 2022-11-23 NOTE — Plan of Care (Signed)
  Problem: Education: Goal: Knowledge of General Education information will improve Description Including pain rating scale, medication(s)/side effects and non-pharmacologic comfort measures Outcome: Progressing   Problem: Health Behavior/Discharge Planning: Goal: Ability to manage health-related needs will improve Outcome: Progressing   Problem: Clinical Measurements: Goal: Ability to maintain clinical measurements within normal limits will improve Outcome: Progressing Goal: Will remain free from infection Outcome: Progressing Goal: Diagnostic test results will improve Outcome: Progressing Goal: Respiratory complications will improve Outcome: Progressing Goal: Cardiovascular complication will be avoided Outcome: Progressing   Problem: Activity: Goal: Risk for activity intolerance will decrease Outcome: Progressing   Problem: Nutrition: Goal: Adequate nutrition will be maintained Outcome: Progressing   Problem: Coping: Goal: Level of anxiety will decrease Outcome: Progressing   Problem: Elimination: Goal: Will not experience complications related to bowel motility Outcome: Progressing Goal: Will not experience complications related to urinary retention Outcome: Progressing   Problem: Pain Managment: Goal: General experience of comfort will improve Outcome: Progressing   Problem: Safety: Goal: Ability to remain free from injury will improve Outcome: Progressing   Problem: Skin Integrity: Goal: Risk for impaired skin integrity will decrease Outcome: Progressing   Problem: Clinical Measurements: Goal: Ability to avoid or minimize complications of infection will improve Outcome: Progressing   Problem: Skin Integrity: Goal: Skin integrity will improve Outcome: Progressing   

## 2022-11-24 DIAGNOSIS — J438 Other emphysema: Secondary | ICD-10-CM | POA: Diagnosis not present

## 2022-11-24 DIAGNOSIS — I10 Essential (primary) hypertension: Secondary | ICD-10-CM | POA: Diagnosis not present

## 2022-11-24 DIAGNOSIS — N400 Enlarged prostate without lower urinary tract symptoms: Secondary | ICD-10-CM

## 2022-11-24 DIAGNOSIS — L03115 Cellulitis of right lower limb: Secondary | ICD-10-CM | POA: Diagnosis not present

## 2022-11-24 DIAGNOSIS — N189 Chronic kidney disease, unspecified: Secondary | ICD-10-CM

## 2022-11-24 DIAGNOSIS — N179 Acute kidney failure, unspecified: Secondary | ICD-10-CM

## 2022-11-24 DIAGNOSIS — I5020 Unspecified systolic (congestive) heart failure: Secondary | ICD-10-CM | POA: Diagnosis not present

## 2022-11-24 LAB — CBC WITH DIFFERENTIAL/PLATELET
Abs Immature Granulocytes: 0.13 K/uL — ABNORMAL HIGH (ref 0.00–0.07)
Basophils Absolute: 0 K/uL (ref 0.0–0.1)
Basophils Relative: 0 %
Eosinophils Absolute: 0.1 K/uL (ref 0.0–0.5)
Eosinophils Relative: 1 %
HCT: 41.7 % (ref 39.0–52.0)
Hemoglobin: 13.2 g/dL (ref 13.0–17.0)
Immature Granulocytes: 1 %
Lymphocytes Relative: 16 %
Lymphs Abs: 1.6 K/uL (ref 0.7–4.0)
MCH: 32.2 pg (ref 26.0–34.0)
MCHC: 31.7 g/dL (ref 30.0–36.0)
MCV: 101.7 fL — ABNORMAL HIGH (ref 80.0–100.0)
Monocytes Absolute: 1 K/uL (ref 0.1–1.0)
Monocytes Relative: 10 %
Neutro Abs: 7.2 K/uL (ref 1.7–7.7)
Neutrophils Relative %: 72 %
Platelets: 285 K/uL (ref 150–400)
RBC: 4.1 MIL/uL — ABNORMAL LOW (ref 4.22–5.81)
RDW: 12.6 % (ref 11.5–15.5)
WBC: 10.1 K/uL (ref 4.0–10.5)
nRBC: 0 % (ref 0.0–0.2)

## 2022-11-24 LAB — BASIC METABOLIC PANEL WITH GFR
Anion gap: 13 (ref 5–15)
BUN: 45 mg/dL — ABNORMAL HIGH (ref 8–23)
CO2: 29 mmol/L (ref 22–32)
Calcium: 8.6 mg/dL — ABNORMAL LOW (ref 8.9–10.3)
Chloride: 93 mmol/L — ABNORMAL LOW (ref 98–111)
Creatinine, Ser: 1.45 mg/dL — ABNORMAL HIGH (ref 0.61–1.24)
GFR, Estimated: 53 mL/min — ABNORMAL LOW (ref >60)
Glucose, Bld: 112 mg/dL — ABNORMAL HIGH (ref 70–99)
Potassium: 3.2 mmol/L — ABNORMAL LOW (ref 3.5–5.1)
Sodium: 135 mmol/L (ref 135–145)

## 2022-11-24 LAB — COMPREHENSIVE METABOLIC PANEL WITH GFR
ALT: 14 U/L (ref 0–44)
AST: 21 U/L (ref 15–41)
Albumin: 3.2 g/dL — ABNORMAL LOW (ref 3.5–5.0)
Alkaline Phosphatase: 48 U/L (ref 38–126)
Anion gap: 11 (ref 5–15)
BUN: 51 mg/dL — ABNORMAL HIGH (ref 8–23)
CO2: 32 mmol/L (ref 22–32)
Calcium: 9 mg/dL (ref 8.9–10.3)
Chloride: 95 mmol/L — ABNORMAL LOW (ref 98–111)
Creatinine, Ser: 1.59 mg/dL — ABNORMAL HIGH (ref 0.61–1.24)
GFR, Estimated: 47 mL/min — ABNORMAL LOW (ref >60)
Glucose, Bld: 105 mg/dL — ABNORMAL HIGH (ref 70–99)
Potassium: 4 mmol/L (ref 3.5–5.1)
Sodium: 138 mmol/L (ref 135–145)
Total Bilirubin: 0.6 mg/dL (ref 0.3–1.2)
Total Protein: 7 g/dL (ref 6.5–8.1)

## 2022-11-24 LAB — MAGNESIUM: Magnesium: 2.8 mg/dL — ABNORMAL HIGH (ref 1.7–2.4)

## 2022-11-24 LAB — PHOSPHORUS: Phosphorus: 5.2 mg/dL — ABNORMAL HIGH (ref 2.5–4.6)

## 2022-11-24 MED ORDER — MUPIROCIN 2 % EX OINT: 1.0000 | TOPICAL_OINTMENT | Freq: Two times a day (BID) | CUTANEOUS | 0 refills | Status: DC

## 2022-11-24 MED ORDER — ONDANSETRON HCL 4 MG PO TABS: 4.0000 mg | ORAL_TABLET | Freq: Four times a day (QID) | ORAL | 0 refills | Status: DC | PRN

## 2022-11-24 MED ORDER — POTASSIUM CHLORIDE CRYS ER 20 MEQ PO TBCR
40.0000 meq | EXTENDED_RELEASE_TABLET | Freq: Two times a day (BID) | ORAL | Status: DC
  Administered 2022-11-24: 40 meq via ORAL
  Filled 2022-11-24: qty 2

## 2022-11-24 MED ORDER — TORSEMIDE 20 MG PO TABS: 80.0000 mg | ORAL_TABLET | ORAL | Status: DC

## 2022-11-24 MED ORDER — CEFADROXIL 500 MG PO CAPS: 1000.0000 mg | ORAL_CAPSULE | Freq: Two times a day (BID) | ORAL | 0 refills | Status: AC

## 2022-11-24 MED ORDER — SENNOSIDES-DOCUSATE SODIUM 8.6-50 MG PO TABS: 1.0000 | ORAL_TABLET | Freq: Every evening | ORAL | 0 refills | Status: DC | PRN

## 2022-11-24 NOTE — Discharge Summary (Signed)
Physician Discharge Summary   Patient: [name] MRN: [id number] DOB: 08/06/1956  Admit date:     11/20/2022  Discharge date: 11/24/22  Discharge Physician: Raiford Noble, DO   PCP: Troy Sine, MD   Recommendations at discharge:   Follow up with PCP within 1-2 weeks and repeat CBC, CMP, Mag, Phos within 1 week Follow up Cardiology within 1-2 weeks and have cardiology adjust diuretic dose but will hold initially for least a few days Follow-up with infectious disease in outpatient setting if necessary  Discharge Diagnoses: Principal Problem:   Cellulitis of right lower extremity without foot Active Problems:   HFrEF (heart failure with reduced ejection fraction) (HCC)   COPD (chronic obstructive pulmonary disease) (HCC)   Permanent atrial fibrillation (HCC)   Essential hypertension   Obstructive sleep apnea   Benign prostatic hyperplasia   Anxiety and depression   Cellulitis and abscess of leg, except foot  Resolved Problems:   * No resolved hospital problems. *  Hospital Course: [name] is a 67 y.o. male with medical history significant for permanent A-fib s/p AV nodal ablation on Eliquis, HFrEF (EF 40%) and CHB s/p CRT-D, COPD, HTN, HLD, anxiety, OSA intolerant to CPAP who is admitted with cellulitis of the right lower extremity failing outpatient antibiotics.  Right leg remains significantly swollen compared to left and recent DVT scan done and was negative for DVT.  Given failed outpatient antibiotics we have consulted infectious diseases for further evaluation.  Currently he remains on IV Ceftriaxone but ID has now changed Abx to IV Cefazolin further changing to p.o. cefadroxil.  Will observe him overnight on new antibiotics and discharge in the a.m. if well-tolerated given that he did fail outpatient antibiotics.  He was placed on IV antibiotics and ID was consulted and they changed his antibiotics to IV cefazolin and he steadily improved.  Patient's leg swelling and  erythema improved and he was deemed medically stable and ID recommended p.o. cefadroxil to complete a 10-day course.  Prior to discharge his creatinine slightly bumped and patient states that this happens quite frequently and so he was advised to hold his diuretics and follow-up with cardiology within 1 to 2 weeks.  Patient has an appointment on Tuesday with his primary care provider.  He is medically stable for discharge at this time will need to follow-up with PCP, cardiology and ID in outpatient setting given that he is medically improved  Assessment and Plan: * Cellulitis of right lower extremity without foot -Failed outpatient antibiotics with p.o. Doxycycline and Clindamycin  hemodynamically stable without fever or leukocytosis.   -Blood cultures in process.  -Recent outpatient Lower extremity Dopplers on 2/2 negative for evidence of DVT. -DG Tib/Fib X-Ray done and showed "No acute findings. Chronic patellar fracture with further distraction since previous." -He was initiated on IV ceftriaxone -Consult ID for further evaluation and recommendations and appreciate their evaluation and they are recommending checking a MRSA colonization and recommending to hold off of vancomycin for now and elevating the legs and switching him to cefazolin 2 g IV every 8 hours now meropenem switch him to p.o. cefadroxil 1000 mg twice daily for 8 more days; he was given cefadroxil and tolerated this well and he is stable for discharge   HFrEF (heart failure with reduced ejection fraction) ([facility]) -S/p CRT-D.   -Overall appears compensated aside from unilateral RLE edema in setting of cellulitis.   -Last EF 40% 10/24/2022 and also showed that the left ventricular mildly decreased  function with global hypokinesis and the left ventricular diastolic parameters were indeterminate.  Right ventricular systolic function was normal and the right ventricular size is mildly enlarged and there was normal pulmonary artery systolic  pressure -Continue Torsemide 80 mg twice daily however will advise to hold temporarily given his slight renal functioning and AKI -Continue Carvedilol 6.25 mg p.o. twice daily, Losartan 12.5 mg p.o. nightly, Spironolactone 25 mg p.o. nightly, and Torsemide 80 mg p.o. twice daily -Strict I's and O's and daily weights -Patient is down 8 pounds since admission and is -1. 0414 L -Continue to monitor for signs and symptoms of volume overload and repeat chest x-ray in a.m.   Permanent Atrial Fibrillation (HCC) -S/p AV nodal ablation and CRT-D with V-paced rhythm on admit. -Continue 6.25 mg p.o. twice daily and anticoagulation with apixaban 5 mg p.o. twice daily -If necessary transfer him will place him on telemetry   COPD (Chronic Obstructive Pulmonary disease) ([place]) -Stable without wheezing on admission.   -Continue Trelegy Ellipta substitution of Breo Ellipta and Incruse Ellipta given together 1 puff IH daily and albuterol 2.5 mg every 6 as needed for wheezing/breath as well as DuoNeb 3 mL's every 4 as needed wheezing or shortness of breath SpO2: 99 % O2 Flow Rate (L/min): 0 L/min -C/w Prednisone 10 mg po Daily  -Continue to monitor respiratory status carefully and if necessary provide supplemental oxygen -DG Chest X-Ray on admission showed "Enlarged cardiac silhouette. Consolidation or volume loss of the bases. No pneumothorax. Normal pulmonary vasculature. Left-sided pacemaker." -Continue with Dupixent in the outpatient setting   Essential Hypertension -Continue Carvedilol 6.25 mg p.o. twice daily, Losartan 12.5 mg p.o. nightly, Spironolactone 25 mg p.o. nightly, and Torsemide 80 mg p.o. twice daily -Continue to Monitor BP per Protocol  -Last BP reading was 116/73   Anxiety and Depression Insomnia -Continue Venlafaxine XR 150 mg po Daily, Zolpidem 10 mg po qHS, Doxepin 10 mg po qH  and home Alprazolam 1 mg po qHS. -Resume home Duloxetine   Benign Prostatic Hyperplasia -Continue  Tamsulosin 0.4 mg po Daily .   Obstructive Sleep Apnea/OHS -Intolerant of CPAP.  Use supplemental O2 as needed.  Hypokalemia -Patient's K+ Level Trend: Recent Labs  Lab 11/20/22 1205 11/21/22 0538 11/22/22 0454 11/23/22 0945 11/24/22 0601 11/24/22 1209  K 4.1 4.2 3.4* 3.8 4.0 3.2*  -Replete with po Kcl 40 mEQ BID x2 -Continue to Monitor and Replete as Necessary -Repeat CMP in the AM   AKI on CKD stage II -BUN/Cr Trend: Recent Labs  Lab 11/20/22 1205 11/21/22 0538 11/22/22 0454 11/23/22 0945 11/24/22 0601 11/24/22 1209  BUN 26* 24* 37* 46* 51* 45*  CREATININE 1.07 0.95 1.21 1.27* 1.59* 1.45*  -Avoid Nephrotoxic Medications, Contrast Dyes, Hypotension and Dehydration to Ensure Adequate Renal Perfusion and will need to Renally Adjust Meds; will hold his Lasix temporarily given his slight bump in creatinine-patient states that he has never had this before and follows up with cardiology and they usually hold his diuretics when this happens -Continue to Monitor and Trend Renal Function carefully and repeat CMP in the AM    Leukocytosis, improved  -WBC Trend: Recent Labs  Lab 11/20/22 1205 11/21/22 0538 11/22/22 0454 11/23/22 0945 11/24/22 0601  WBC 9.1 11.0* 10.2 9.7 10.1  -Likely in the setting of RLE Cellulitis and improved  -C/w Antibiotics as above but will have ID evaluate  -LA was 1.2 -Continue to Monitor and Trend WBC Count and repeat CBC in the AM    Hypoalbuminemia -  Patient's Albumin Trend: Recent Labs  Lab 11/20/22 1205 11/22/22 0454 11/23/22 0945 11/24/22 0601  ALBUMIN 3.2* 3.4* 3.3* 3.2*  -Continue to Monitor and Trend and repeat CMP in the AM  Obesity -Complicates overall prognosis and care -Estimated body mass index is 38.45 kg/m as calculated from the following:   Height as of this encounter: 5' 10"$  (1.778 m).   Weight as of this encounter: 121.6 kg.  -Weight Loss and Dietary Counseling given  Consultants: Infectious Diseases  Procedures  performed: As above  Disposition: Home Diet recommendation:  Discharge Diet Orders (From admission, onward)     Start     Ordered   11/24/22 0000  Diet - low sodium heart healthy        11/24/22 1250           Cardiac diet DISCHARGE MEDICATION: Allergies as of 11/24/2022       Reactions   Tape Other (See Comments)   SKIN TEARS VERY, VERY EASILY!!!! Use Paper Tape Only, PLEASE!!!   Amiodarone Other (See Comments)   Caused hyperthyroidism   Statins Other (See Comments)   Myalgias        Medication List     STOP taking these medications    clindamycin 300 MG capsule Commonly known as: Cleocin   doxycycline 50 MG capsule Commonly known as: VIBRAMYCIN   ibuprofen 200 MG tablet Commonly known as: ADVIL   metolazone 2.5 MG tablet Commonly known as: ZAROXOLYN       TAKE these medications    acetaminophen 500 MG tablet Commonly known as: TYLENOL Take 500-1,000 mg by mouth every 6 (six) hours as needed for mild pain or headache.   albuterol 108 (90 Base) MCG/ACT inhaler Commonly known as: VENTOLIN HFA INHALE 2 PUFFS BY MOUTH 4 TIMES A DAY AS NEEDED What changed: See the new instructions.   ALPRAZolam 0.5 MG tablet Commonly known as: XANAX Take 2 tablets (1 mg total) by mouth at bedtime.   augmented betamethasone dipropionate 0.05 % cream Commonly known as: DIPROLENE-AF Apply 1 application  topically daily as needed (to affected areas- for itching).   carisoprodol 350 MG tablet Commonly known as: SOMA TAKE 1 TABLET BY MOUTH THREE TIMES A DAY AS NEEDED FOR MUSCLE SPASM What changed: See the new instructions.   carvedilol 6.25 MG tablet Commonly known as: COREG TAKE 1 TABLET BY MOUTH TWICE A DAY   cefadroxil 500 MG capsule Commonly known as: DURICEF Take 2 capsules (1,000 mg total) by mouth 2 (two) times daily for 8 days.   Vitamin D3 1000 units Caps Take 1,000 Units by mouth daily.   Cholecalciferol 100 MCG (4000 UT) Caps 1 tab by mouth once  daily   dapagliflozin propanediol 10 MG Tabs tablet Commonly known as: Farxiga Take 1 tablet (10 mg total) by mouth daily before breakfast.   doxepin 10 MG capsule Commonly known as: SINEQUAN TAKE 2 CAPSULES (20 MG TOTAL) BY MOUTH AT BEDTIME AS NEEDED. What changed: when to take this   DULoxetine 60 MG capsule Commonly known as: CYMBALTA Take 60 mg by mouth daily.   Dupixent 300 MG/2ML Sopn Generic drug: Dupilumab Inject 300 mg into the skin every 14 (fourteen) days.   Eliquis 5 MG Tabs tablet Generic drug: apixaban TAKE 1 TABLET BY MOUTH TWICE A DAY What changed: when to take this   eplerenone 50 MG tablet Commonly known as: Inspra Take 1 tablet (50 mg total) by mouth daily.   fluticasone 50 MCG/ACT nasal spray Commonly  known as: FLONASE PLACE 2 SPRAYS INTO BOTH NOSTRILS DAILY AS NEEDED FOR ALLERGIES OR RHINITIS.   Full Kit Nebulizer Set Misc 1 each by Does not apply route as needed.   GOODY HEADACHE PO Take 1 packet by mouth every 6 (six) hours as needed (for pain).   hydrOXYzine 10 MG tablet Commonly known as: ATARAX TAKE 1 TABLET BY MOUTH 3 TIMES DAILY AS NEEDED FOR ITCHING. What changed: See the new instructions.   ipratropium-albuterol 0.5-2.5 (3) MG/3ML Soln Commonly known as: DUONEB TAKE 3 ML BY NEBULIZATION EVERY 4 HOURS AS NEEDED (WHEEZING). What changed: See the new instructions.   ketoconazole 2 % cream Commonly known as: NIZORAL Apply 1 Application topically daily. What changed:  when to take this reasons to take this   Klor-Con M20 20 MEQ tablet Generic drug: potassium chloride SA TAKE 1 TABLET BY MOUTH EVERY DAY What changed:  how much to take Another medication with the same name was removed. Continue taking this medication, and follow the directions you see here.   losartan 25 MG tablet Commonly known as: COZAAR Take 1 tablet (25 mg total) by mouth daily. What changed:  how much to take when to take this   mupirocin ointment 2  % Commonly known as: BACTROBAN Place 1 Application into the nose 2 (two) times daily.   omeprazole 20 MG capsule Commonly known as: PRILOSEC TAKE 1 CAPSULE BY MOUTH TWICE A DAY What changed:  when to take this additional instructions   ondansetron 4 MG tablet Commonly known as: ZOFRAN Take 1 tablet (4 mg total) by mouth every 6 (six) hours as needed for nausea.   predniSONE 20 MG tablet Commonly known as: DELTASONE Take 1 tablet (20 mg total) by mouth daily with breakfast. What changed: how much to take   Repatha SureClick [id number] MG/ML Soaj Generic drug: Evolocumab INJECT 1 PEN INTO THE SKIN EVERY 14 (FOURTEEN) DAYS. What changed: how much to take   senna-docusate 8.6-50 MG tablet Commonly known as: Senokot-S Take 1 tablet by mouth at bedtime as needed for mild constipation.   silver sulfADIAZINE 1 % cream Commonly known as: SILVADENE APPLY TOPICALLY TO AFFECTED AREA EVERY DAY What changed: See the new instructions.   tamsulosin 0.4 MG Caps capsule Commonly known as: FLOMAX TAKE 1 CAPSULE BY MOUTH EVERY DAY What changed: when to take this   torsemide 20 MG tablet Commonly known as: DEMADEX Take 4 tablets (80 mg total) by mouth See admin instructions. Take 80 mg by mouth in the morning and evening; HOLD For a few days and resume next week with close follow up with Dr. [name] What changed:  when to take this additional instructions   traMADol 50 MG tablet Commonly known as: ULTRAM TAKE 1 TABLET BY MOUTH EVERY DAY AS NEEDED What changed:  how much to take reasons to take this   traZODone 100 MG tablet Commonly known as: DESYREL TAKE 2 TABLETS BY MOUTH AT BEDTIME AS NEEDED What changed: See the new instructions.   Trelegy Ellipta 100-62.5-25 MCG/ACT Aepb Generic drug: Fluticasone-Umeclidin-Vilant Inhale 1 puff into the lungs daily.   Tums 500 MG chewable tablet Generic drug: calcium carbonate Chew 500-2,000 mg by mouth every 4 (four) hours as needed for  indigestion or heartburn.   venlafaxine XR 150 MG 24 hr capsule Commonly known as: Effexor XR Take 1 capsule (150 mg total) by mouth daily with breakfast. What changed: when to take this   zolpidem 10 MG tablet Commonly known as: AMBIEN  TAKE 1 TABLET BY MOUTH EVERY DAY AT BEDTIME AS NEEDED What changed: See the new instructions.        Discharge Exam: Filed Weights   11/22/22 0500 11/22/22 2128 11/24/22 0434  Weight: 113.5 kg 122.3 kg 121.6 kg   Vitals:   11/24/22 0434 11/24/22 0850  BP: 95/63 109/64  Pulse: 71   Resp: 18 18  Temp: (!) 97.1 F (36.2 C) (!) 97.5 F (36.4 C)  SpO2: 96% 99%   Examination: Physical Exam:  Constitutional: WN/WD obese Caucasian male currently no acute distress appears calm Respiratory: Diminished to auscultation bilaterally, no wheezing, rales, rhonchi or crackles. Normal respiratory effort and patient is not tachypenic. No accessory muscle use.  Unlabored breathing Cardiovascular: RRR, no murmurs / rubs / gallops. S1 and S2 auscultated.  Has 1+ lower extremity edema worse on the right compared to left and his legs are wrapped in Ace bandages as well Abdomen: Soft, non-tender, distended secondary to body habitus. Bowel sounds positive.  GU: Deferred. Musculoskeletal: No clubbing / cyanosis of digits/nails. No joint deformity upper and lower extremities.  Skin: He has erythema on his legs worse on the right compared to left and warmth but this is improving and swelling is also there but this is also improved.  Legs are wrapped in Ace bandage Neurologic: CN 2-12 grossly intact with no focal deficits. Romberg sign and cerebellar reflexes not assessed.  Psychiatric: Normal judgment and insight. Alert and oriented x 3. Normal mood and appropriate affect.    Condition at discharge: stable  The results of significant diagnostics from this hospitalization (including imaging, microbiology, ancillary and laboratory) are listed below for reference.    Imaging Studies: DG CHEST PORT 1 VIEW  Result Date: 11/22/2022 CLINICAL DATA:  [id number] with shortness of breath. EXAM: PORTABLE CHEST 1 VIEW COMPARISON:  PA Lat 11/20/2022 FINDINGS: 4:34 a.m. Left chest dual lead pacing system with AID wiring is unchanged. Stable moderate cardiomegaly. Central vessels are normal caliber. The lower lung fields are obscured by expiratory lung volumes. A basilar infiltrate or small effusion could be hidden. The mid to upper lung fields are clear. There is a stable mediastinal configuration accounting for exploration. No new osseous findings. Overall study is recommended in full inspiration. In all other respects there are no further changes. IMPRESSION: Limited study due to expiratory technique. The lower lung fields are obscured. A basilar infiltrate or effusion could be hidden. Recommend full inspiration PA and lateral views. The visualized lungs are clear. Electronically Signed   By: Telford Nab M.D.   On: 11/22/2022 07:11   DG Tibia/Fibula Right  Result Date: 11/20/2022 CLINICAL DATA:  Cellulitis, rule out osteomyelitis EXAM: RIGHT TIBIA AND FIBULA - 2 VIEW COMPARISON:  12/21/2019 FINDINGS: Knee arthroplasty components project in expected location. Chronic patellar fracture with further distraction of dominant fracture fragments since previous. No acute fracture or dislocation. No effusion. Arterial calcifications in the calf. Calcaneal spur. IMPRESSION: 1. No acute findings. 2. Chronic patellar fracture with further distraction since previous. Electronically Signed   By: Lucrezia Europe M.D.   On: 11/20/2022 13:41   DG Chest 2 View  Result Date: 11/20/2022 CLINICAL DATA:  SOB EXAM: CHEST - 2 VIEW COMPARISON:  10/10/2021 FINDINGS: Enlarged cardiac silhouette. Consolidation or volume loss of the bases. No pneumothorax. Normal pulmonary vasculature. Left-sided pacemaker. IMPRESSION: Enlarged cardiac silhouette. Bibasilar volume loss or consolidation. Electronically Signed    By: Sammie Bench M.D.   On: 11/20/2022 12:35   VAS [place] LOWER EXTREMITY  VENOUS (DVT)  Result Date: 11/19/2022  Lower Venous DVT Study Patient Name:  [name]  Date of Exam:   11/16/2022 Medical Rec #: [id number]       Accession #:    [id number] Date of Birth: [birth year]        Patient Gender: M Patient Age:   68 years Exam Location:  [facility] Procedure:      VAS [place] LOWER EXTREMITY VENOUS (DVT) Referring Phys: JESSICA MILFORD --------------------------------------------------------------------------------  Other Indications: Patient with right lower extremity pain, redness, warmth and                    cellulits which started about 2 weeks ago as a small right                    lower extremity wound. Evaluate for DVT. Limitations: Body habitus, pain and edema. Performing Technologist: Mariane Masters RVT  Examination Guidelines: A complete evaluation includes B-mode imaging, spectral Doppler, color Doppler, and power Doppler as needed of all accessible portions of each vessel. Bilateral testing is considered an integral part of a complete examination. Limited examinations for reoccurring indications may be performed as noted. The reflux portion of the exam is performed with the patient in reverse Trendelenburg.  +---------+---------------+---------+-----------+----------+-------------------+ RIGHT    CompressibilityPhasicitySpontaneityPropertiesThrombus Aging      +---------+---------------+---------+-----------+----------+-------------------+ CFV      Full           Yes      Yes                                      +---------+---------------+---------+-----------+----------+-------------------+ SFJ      Full           Yes      Yes                                      +---------+---------------+---------+-----------+----------+-------------------+ FV Prox  Full           Yes      Yes                                       +---------+---------------+---------+-----------+----------+-------------------+ FV Mid   Full           Yes      Yes                                      +---------+---------------+---------+-----------+----------+-------------------+ FV DistalFull           Yes      Yes                                      +---------+---------------+---------+-----------+----------+-------------------+ PFV      Full                                                             +---------+---------------+---------+-----------+----------+-------------------+  POP      Full           Yes      Yes                                      +---------+---------------+---------+-----------+----------+-------------------+ PTV      Full           Yes      Yes                                      +---------+---------------+---------+-----------+----------+-------------------+ PERO                                                  Not well visualized +---------+---------------+---------+-----------+----------+-------------------+ Gastroc  Full                                                             +---------+---------------+---------+-----------+----------+-------------------+ GSV      Full           Yes      Yes                                      +---------+---------------+---------+-----------+----------+-------------------+ Difficult visualization of the calf veins due to pitting edema and pain  +---------+---------------+---------+-----------+----------+--------------+ LEFT     CompressibilityPhasicitySpontaneityPropertiesThrombus Aging +---------+---------------+---------+-----------+----------+--------------+ CFV      Full           Yes      Yes                                 +---------+---------------+---------+-----------+----------+--------------+ SFJ      Full           Yes      Yes                                  +---------+---------------+---------+-----------+----------+--------------+ FV Prox  Full           Yes      Yes                                 +---------+---------------+---------+-----------+----------+--------------+ FV Mid   Full           Yes      Yes                                 +---------+---------------+---------+-----------+----------+--------------+ FV DistalFull           Yes      Yes                                 +---------+---------------+---------+-----------+----------+--------------+  PFV      Full                                                        +---------+---------------+---------+-----------+----------+--------------+ POP      Full           Yes      Yes                                 +---------+---------------+---------+-----------+----------+--------------+ PTV      Full           Yes      Yes                                 +---------+---------------+---------+-----------+----------+--------------+ PERO     Full           Yes      Yes                                 +---------+---------------+---------+-----------+----------+--------------+ Gastroc  Full                                                        +---------+---------------+---------+-----------+----------+--------------+ GSV      Full           Yes      Yes                                 +---------+---------------+---------+-----------+----------+--------------+     Summary: RIGHT: - No evidence of deep vein thrombosis in the lower extremity. No indirect evidence of obstruction proximal to the inguinal ligament. - No cystic structure found in the popliteal fossa.  LEFT: - No evidence of deep vein thrombosis in the lower extremity. No indirect evidence of obstruction proximal to the inguinal ligament. - No cystic structure found in the popliteal fossa.  *See table(s) above for measurements and observations. Electronically signed by Servando Snare MD on 11/19/2022  at 2:50:40 PM.    Final     Microbiology: Results for orders placed or performed during the hospital encounter of 11/20/22  Blood Cultures x 2 sites     Status: None (Preliminary result)   Collection Time: 11/20/22  1:34 PM   Specimen: BLOOD LEFT FOREARM  Result Value Ref Range Status   Specimen Description   Final    BLOOD LEFT FOREARM Performed at [facility], [address], [place], [place] [address]    Special Requests   Final    BOTTLES DRAWN AEROBIC AND ANAEROBIC Blood Culture adequate volume Performed at [facility], [address] [place], [place] [address]    Culture   Final    NO GROWTH 4 DAYS Performed at [facility], [place] [address] [place], [place] [address]    Report Status PENDING  Incomplete  Blood Cultures x 2 sites     Status: None (Preliminary  result)   Collection Time: 11/20/22  1:47 PM   Specimen: BLOOD  Result Value Ref Range Status   Specimen Description   Final    BLOOD RIGHT ANTECUBITAL Performed at [facility], [address] [place], [place] [address]    Special Requests   Final    BOTTLES DRAWN AEROBIC AND ANAEROBIC Blood Culture adequate volume Performed at [facility], [address] [place], [place] [id number]    Culture   Final    NO GROWTH 4 DAYS Performed at [facility], [place] [address] [place], [place] [address]    Report Status PENDING  Incomplete  MRSA Next Gen by PCR, Nasal     Status: Abnormal   Collection Time: 11/21/22  3:46 PM   Specimen: Nasal Mucosa; Nasal Swab  Result Value Ref Range Status   MRSA by PCR Next Gen DETECTED (A) NOT DETECTED Final    Comment: (NOTE) The GeneXpert MRSA Assay (FDA approved for NASAL specimens only), is one component of a comprehensive MRSA colonization surveillance program. It is not intended to diagnose MRSA infection nor to guide or monitor treatment for MRSA infections. Test performance is not FDA approved in patients less than 51  years old. Performed at [facility], [place] [address], [place], [place] [address]    *Note: Due to a large number of results and/or encounters for the requested time period, some results have not been displayed. A complete set of results can be found in Results Review.   Labs: CBC: Recent Labs  Lab 11/20/22 1205 11/21/22 0538 11/22/22 0454 11/23/22 0945 11/24/22 0601  WBC 9.1 11.0* 10.2 9.7 10.1  NEUTROABS 6.9  --  7.2 7.7 7.2  HGB 13.1 14.8 13.6 13.8 13.2  HCT 41.2 46.6 43.1 43.9 41.7  MCV 100.0 102.9* 101.4* 102.6* 101.7*  PLT 236 260 239 275 [id number]   Basic Metabolic Panel: Recent Labs  Lab 11/21/22 0538 11/22/22 0454 11/23/22 0945 11/24/22 0601 11/24/22 1209  NA 136 136 136 138 135  K 4.2 3.4* 3.8 4.0 3.2*  CL 99 95* 98 95* 93*  CO2 28 28 30 $ 32 29  GLUCOSE 85 98 120* 105* 112*  BUN 24* 37* 46* 51* 45*  CREATININE 0.95 1.21 1.27* 1.59* 1.45*  CALCIUM 9.0 9.0 9.0 9.0 8.6*  MG  --  2.3 2.6* 2.8*  --   PHOS  --  5.1* 4.5 5.2*  --    Liver Function Tests: Recent Labs  Lab 11/20/22 1205 11/22/22 0454 11/23/22 0945 11/24/22 0601  AST 26 19 20 21  $ ALT 48* 32 17 14  ALKPHOS 50 47 51 48  BILITOT 0.6 0.7 0.5 0.6  PROT 6.7 6.6 7.1 7.0  ALBUMIN 3.2* 3.4* 3.3* 3.2*   CBG: No results for input(s): "GLUCAP" in the last 168 hours.  Discharge time spent: greater than 30 minutes.  Signed: Raiford Noble, DO [facility] 11/24/2022

## 2022-11-24 NOTE — Progress Notes (Signed)
I attest to student documentation.  Ammie Ferrier, MSN-RN [place]

## 2022-11-24 NOTE — Plan of Care (Signed)
  Problem: Education: Goal: Knowledge of General Education information will improve Description Including pain rating scale, medication(s)/side effects and non-pharmacologic comfort measures Outcome: Progressing   Problem: Health Behavior/Discharge Planning: Goal: Ability to manage health-related needs will improve Outcome: Progressing   Problem: Clinical Measurements: Goal: Ability to maintain clinical measurements within normal limits will improve Outcome: Progressing Goal: Will remain free from infection Outcome: Progressing Goal: Diagnostic test results will improve Outcome: Progressing Goal: Respiratory complications will improve Outcome: Progressing Goal: Cardiovascular complication will be avoided Outcome: Progressing   Problem: Activity: Goal: Risk for activity intolerance will decrease Outcome: Progressing   Problem: Nutrition: Goal: Adequate nutrition will be maintained Outcome: Progressing   Problem: Coping: Goal: Level of anxiety will decrease Outcome: Progressing   Problem: Elimination: Goal: Will not experience complications related to bowel motility Outcome: Progressing Goal: Will not experience complications related to urinary retention Outcome: Progressing   Problem: Pain Managment: Goal: General experience of comfort will improve Outcome: Progressing   Problem: Safety: Goal: Ability to remain free from injury will improve Outcome: Progressing   Problem: Skin Integrity: Goal: Risk for impaired skin integrity will decrease Outcome: Progressing   Problem: Clinical Measurements: Goal: Ability to avoid or minimize complications of infection will improve Outcome: Progressing   Problem: Skin Integrity: Goal: Skin integrity will improve Outcome: Progressing   

## 2022-11-25 ENCOUNTER — Other Ambulatory Visit: Payer: Self-pay | Admitting: Family Medicine

## 2022-11-25 ENCOUNTER — Encounter (HOSPITAL_COMMUNITY): Payer: Self-pay | Admitting: Cardiology

## 2022-11-25 LAB — CULTURE, BLOOD (ROUTINE X 2)
Culture: NO GROWTH
Culture: NO GROWTH
Special Requests: ADEQUATE
Special Requests: ADEQUATE

## 2022-11-26 ENCOUNTER — Telehealth: Payer: Self-pay

## 2022-11-26 NOTE — Transitions of Care (Post Inpatient/ED Visit) (Signed)
   11/26/2022  Name: [name] MRN: [id number] DOB: 06/22/1956  Today's TOC FU Call Status: Today's TOC FU Call Status:: Successful TOC FU Call Competed TOC FU Call Complete Date: 11/26/22  Transition Care Management Follow-up Telephone Call Date of Discharge: 11/24/22 Discharge Facility: [facility] Type of Discharge: Inpatient Admission Primary Inpatient Discharge Diagnosis:: Cellulitis RLE How have you been since you were released from the hospital?: Better Any questions or concerns?: No  Items Reviewed: Did you receive and understand the discharge instructions provided?: Yes Medications obtained and verified?: Yes (Medications Reviewed) Any new allergies since your discharge?: No Dietary orders reviewed?: No Do you have support at home?: Yes People in Home: child(ren), adult Name of Support/Comfort Primary Source: [place] and Equipment/Supplies: [place] Ordered?: No Any new equipment or medical supplies ordered?: No  Functional Questionnaire: Do you need assistance with bathing/showering or dressing?: No Do you need assistance with meal preparation?: No Do you need assistance with eating?: No Do you have difficulty maintaining continence: No Do you need assistance with getting out of bed/getting out of a chair/moving?: No Do you have difficulty managing or taking your medications?: No  Folllow up appointments reviewed: PCP Follow-up appointment confirmed?: Yes Date of PCP follow-up appointment?: 11/29/22 Follow-up Provider: Dr. Tamala Julian Specialist [facility] Follow-up appointment confirmed?: No Reason Specialist Follow-Up Not Confirmed: [facility] Calling Clinician Notified Provider Practice of Needed Appointment Do you need transportation to your follow-up appointment?: No Do you understand care options if your condition(s) worsen?: Yes-patient verbalized understanding  SDOH Interventions Today    Flowsheet Row Most Recent Value  SDOH Interventions    Food Insecurity Interventions Intervention Not Indicated  Transportation Interventions Intervention Not Indicated       Johnney Killian, RN, BSN, CCM Care Management Coordinator [facility] Phone: [phone]: [phone]

## 2022-11-27 ENCOUNTER — Ambulatory Visit (HOSPITAL_COMMUNITY): Payer: Medicare HMO

## 2022-11-28 NOTE — Progress Notes (Unsigned)
[name] [place] [address] [place] Phone: [phone] Subjective:   [name], am serving as a scribe for Dr. Hulan Saas.  I'm seeing this patient by the request  of:  Troy Sine, MD  CC: Bilateral shoulder pain  [id number]  10/18/2022 Chronic problem with worsening symptoms.  Given injection today, was able to ambulate better.  Do feel there is some underlying arthritis will monitor.  Discussed icing regimen and home exercises, which activities to do and which ones to avoid.  Patient will continue to ambulate with the cane.  Follow-up again in 6 to 8 weeks.  Social determinant health as patient is highly inactive secondary to his chronic comorbidities.     Chronic problem with worsening symptoms still.  Still wants to avoid any surgical intervention.  Understands that these are only temporary but does give him improvement for the next 8 to 10 weeks.  Patient will follow-up again in that amount of time for further evaluation and treatment.     Update 11/29/2022 [name] is a 67 y.o. male coming in with complaint of L hip & B shoulder pain. Patient states having some left side abdominal pain. Would like a refill of prednisone. Shoulders and hip still hurts. Was recently in the hospital for cellulitis.  Reviewed patient's records and also with concern for still congestive heart failure.      Past Medical History:  Diagnosis Date   AICD (automatic cardioverter/defibrillator) present    Anemia    supposed to be taking Vit B but doesn't   ANXIETY    takes Xanax nightly   Arthritis    Asthma    Albuterol prn and Advair daily;also takes Prednisone daily   Cardiomyopathy ([place])    a. EF 25% TEE July 2013; b. EF normalized 2015;  c. 03/2015 Echo: EF 40-45%, difrf HK, PASP 38 mmHg, Mild MR, sev LAE/RAE.   Chronic constipation    takes OTC stool softener   COPD (chronic obstructive pulmonary disease) (HCC)    "one dr says  COPD; one dr says emphysema" (09/18/2017)   DEPRESSION    takes Zoloft and Doxepin daily   Diverticulitis    DYSKINESIA, ESOPHAGUS    Essential hypertension        FIBROMYALGIA    GERD (gastroesophageal reflux disease)        Glaucoma    HYPERLIPIDEMIA    a. Intolerant to statins.   INSOMNIA    takes Ambien nightly   Myocardial infarction [facility])    a. 2012 Myoview notable for prior infarct;  b. 03/2015 Lexiscan CL: EF 37%, diff HK, small area of inferior infarct from apex to base-->Med Rx.   O2 dependent    "2.5L q hs & prn" (09/18/2017)   Paroxysmal atrial fibrillation ([place])    a. CHA2DS2VASc = 3--> takes Coumadin;  b. 03/15/2015 Successful TEE/DCCV;  c. 03/2015 recurrent afib, Amio d/c'd in setting of hyperthyroidism.   Peripheral neuropathy    Pneumonia 12/2016   Rash and other nonspecific skin eruption 04/12/2009   no cause found saw dermatologists x 2 and allergist   SLEEP APNEA, OBSTRUCTIVE    a. doesn't use CPAP   Syncope    a. 03/2015 s/p MDT LINQ.   Type II diabetes mellitus ([place])        Past Surgical History:  Procedure Laterality Date   ACNE CYST REMOVAL     2 on back    AV NODE ABLATION  N/A 10/25/2017   Procedure: AV NODE ABLATION;  Surgeon: Deboraha Sprang, MD;  Location: [place] [facility];  Service: Cardiovascular;  Laterality: N/A;   BIV ICD INSERTION CRT-D N/A 09/18/2017   Procedure: BIV ICD INSERTION CRT-D;  Surgeon: Deboraha Sprang, MD;  Location: [place] [facility];  Service: Cardiovascular;  Laterality: N/A;   CARDIAC CATHETERIZATION N/A 03/21/2016   Procedure: Right/Left Heart Cath and Coronary Angiography;  Surgeon: Larey Dresser, MD;  Location: [place] [facility];  Service: Cardiovascular;  Laterality: N/A;   CARDIOVERSION  04/18/2012   Procedure: CARDIOVERSION;  Surgeon: Fay Records, MD;  Location: [place];  Service: Cardiovascular;  Laterality: N/A;   CARDIOVERSION  04/25/2012   Procedure: CARDIOVERSION;  Surgeon: Thayer Headings, MD;  Location: [place];   Service: Cardiovascular;  Laterality: N/A;   CARDIOVERSION  04/25/2012   Procedure: CARDIOVERSION;  Surgeon: Fay Records, MD;  Location: [place];  Service: Cardiovascular;  Laterality: N/A;   CARDIOVERSION  05/09/2012   Procedure: CARDIOVERSION;  Surgeon: Sherren Mocha, MD;  Location: [place];  Service: Cardiovascular;  Laterality: N/A;  changed from crenshaw to cooper by trish/leone-endo   CARDIOVERSION N/A 03/15/2015   Procedure: CARDIOVERSION;  Surgeon: Thayer Headings, MD;  Location: [place];  Service: Cardiovascular;  Laterality: N/A;   COLONOSCOPY     COLONOSCOPY WITH PROPOFOL N/A 10/21/2014   Procedure: COLONOSCOPY WITH PROPOFOL;  Surgeon: Ladene Artist, MD;  Location: [facility];  Service: Endoscopy;  Laterality: N/A;   EP IMPLANTABLE DEVICE N/A 04/06/2015   Procedure: Loop Recorder Insertion;  Surgeon: Evans Lance, MD;  Location: [place] [facility];  Service: Cardiovascular;  Laterality: N/A;   ESOPHAGOGASTRODUODENOSCOPY     JOINT REPLACEMENT     LOOP RECORDER REMOVAL N/A 09/18/2017   Procedure: LOOP RECORDER REMOVAL;  Surgeon: Deboraha Sprang, MD;  Location: [place] [facility];  Service: Cardiovascular;  Laterality: N/A;   RIGHT/LEFT HEART CATH AND CORONARY ANGIOGRAPHY N/A 01/28/2017   Procedure: Right/Left Heart Cath and Coronary Angiography;  Surgeon: Larey Dresser, MD;  Location: [place] [facility];  Service: Cardiovascular;  Laterality: N/A;   TEE WITHOUT CARDIOVERSION  04/25/2012   Procedure: TRANSESOPHAGEAL ECHOCARDIOGRAM (TEE);  Surgeon: Thayer Headings, MD;  Location: [place];  Service: Cardiovascular;  Laterality: N/A;   TEE WITHOUT CARDIOVERSION N/A 03/15/2015   Procedure: TRANSESOPHAGEAL ECHOCARDIOGRAM (TEE);  Surgeon: Thayer Headings, MD;  Location: [place];  Service: Cardiovascular;  Laterality: N/A;   TONSILLECTOMY AND ADENOIDECTOMY     TOTAL KNEE ARTHROPLASTY Right 06/15/2014   Procedure: TOTAL KNEE ARTHROPLASTY;  Surgeon: Renette Butters, MD;  Location:  [place];  Service: Orthopedics;  Laterality: Right;   TOTAL KNEE ARTHROPLASTY Left 10/17/2021   Procedure: Left TOTAL KNEE ARTHROPLASTY;  Surgeon: Mcarthur Rossetti, MD;  Location: [place];  Service: Orthopedics;  Laterality: Left;   Social History   Socioeconomic History   Marital status: Divorced    Spouse name: Not on file   Number of children: 2   Years of education: Not on file   Highest education level: Not on file  Occupational History   Occupation: retired/disabled. prev worked in Therapist, sports.    Employer: DISABLED  Tobacco Use   Smoking status: Former    Packs/day: 2.00    Years: 30.00    Total pack years: 60.00    Types: Cigarettes    Quit date: 10/16/2007    Years since quitting: 15.1   Smokeless  tobacco: Never  Vaping Use   Vaping Use: Never used  Substance and Sexual Activity   Alcohol use: No   Drug use: No   Sexual activity: Not Currently  Other Topics Concern   Not on file  Social History Narrative   Lives alone.   Social Determinants of Health   Financial Resource Strain: Low Risk  (06/29/2022)   Overall Financial Resource Strain (CARDIA)    Difficulty of Paying Living Expenses: Not hard at all  Food Insecurity: No Food Insecurity (11/26/2022)   Hunger Vital Sign    Worried About Running Out of Food in the Last Year: Never true    Ran Out of Food in the Last Year: Never true  Transportation Needs: No Transportation Needs (11/26/2022)   PRAPARE - Hydrologist (Medical): No    Lack of Transportation (Non-Medical): No  Physical Activity: Inactive (06/29/2022)   Exercise Vital Sign    Days of Exercise per Week: 0 days    Minutes of Exercise per Session: 0 min  Stress: No Stress Concern Present (06/29/2022)   [place]    Feeling of Stress : Not at all  Social Connections: Moderately Integrated (06/29/2022)   Social Connection and Isolation Panel [NHANES]     Frequency of Communication with Friends and Family: More than three times a week    Frequency of Social Gatherings with Friends and Family: More than three times a week    Attends Religious Services: More than 4 times per year    Active Member of [employer] or Organizations: Yes    Attends Music therapist: More than 4 times per year    Marital Status: Divorced   Allergies  Allergen Reactions   Tape Other (See Comments)    SKIN TEARS VERY, VERY EASILY!!!! Use Paper Tape Only, PLEASE!!!   Amiodarone Other (See Comments)    Caused hyperthyroidism    Statins Other (See Comments)    Myalgias    Family History  Problem Relation Age of Onset   COPD Mother    Asthma Mother    Colon polyps Mother    Allergies Mother    Hypothyroidism Mother    Asthma Maternal Grandmother    Colon cancer Neg Hx     Current Outpatient Medications (Endocrine & Metabolic):    dapagliflozin propanediol (FARXIGA) 10 MG TABS tablet, Take 1 tablet (10 mg total) by mouth daily before breakfast.   predniSONE (DELTASONE) 10 MG tablet, Take 1 tablet (10 mg total) by mouth daily with breakfast.  Current Outpatient Medications (Cardiovascular):    carvedilol (COREG) 6.25 MG tablet, TAKE 1 TABLET BY MOUTH TWICE A DAY   eplerenone (INSPRA) 50 MG tablet, Take 1 tablet (50 mg total) by mouth daily.   Evolocumab (REPATHA SURECLICK) [id number] MG/ML SOAJ, INJECT 1 PEN INTO THE SKIN EVERY 14 (FOURTEEN) DAYS. (Patient taking differently: Inject 140 mg into the skin every 14 (fourteen) days.)   losartan (COZAAR) 25 MG tablet, Take 1 tablet (25 mg total) by mouth daily. (Patient taking differently: Take 12.5 mg by mouth every evening.)   torsemide (DEMADEX) 20 MG tablet, Take 4 tablets (80 mg total) by mouth See admin instructions. Take 80 mg by mouth in the morning and evening; HOLD For a few days and resume next week with close follow up with Dr. [name]  Current Outpatient Medications (Respiratory):    albuterol  (VENTOLIN HFA) 108 (90 Base) MCG/ACT inhaler, INHALE  2 PUFFS BY MOUTH 4 TIMES A DAY AS NEEDED (Patient taking differently: Inhale 2 puffs into the lungs 4 (four) times daily as needed for wheezing or shortness of breath (or COPD exacerbation).)   fluticasone (FLONASE) 50 MCG/ACT nasal spray, PLACE 2 SPRAYS INTO BOTH NOSTRILS DAILY AS NEEDED FOR ALLERGIES OR RHINITIS. (Patient not taking: Reported on 11/20/2022)   Fluticasone-Umeclidin-Vilant (TRELEGY ELLIPTA) 100-62.5-25 MCG/ACT AEPB, Inhale 1 puff into the lungs daily.   ipratropium-albuterol (DUONEB) 0.5-2.5 (3) MG/3ML SOLN, TAKE 3 ML BY NEBULIZATION EVERY 4 HOURS AS NEEDED (WHEEZING). (Patient taking differently: Take 3 mLs by nebulization every 4 (four) hours as needed (for wheezing or COPD symptoms).)  Current Outpatient Medications (Analgesics):    acetaminophen (TYLENOL) 500 MG tablet, Take 500-1,000 mg by mouth every 6 (six) hours as needed for mild pain or headache.   Aspirin-Acetaminophen-Caffeine (GOODY HEADACHE PO), Take 1 packet by mouth every 6 (six) hours as needed (for pain).   traMADol (ULTRAM) 50 MG tablet, TAKE 1 TABLET BY MOUTH EVERY DAY AS NEEDED (Patient taking differently: Take 25 mg by mouth daily as needed for moderate pain.)  Current Outpatient Medications (Hematological):    ELIQUIS 5 MG TABS tablet, TAKE 1 TABLET BY MOUTH TWICE A DAY (Patient taking differently: Take 5 mg by mouth in the morning.)  Current Outpatient Medications (Other):    ALPRAZolam (XANAX) 0.5 MG tablet, Take 2 tablets (1 mg total) by mouth at bedtime.   augmented betamethasone dipropionate (DIPROLENE-AF) 0.05 % cream, Apply 1 application  topically daily as needed (to affected areas- for itching).   carisoprodol (SOMA) 350 MG tablet, TAKE 1 TABLET BY MOUTH THREE TIMES A DAY AS NEEDED FOR MUSCLE SPASM (Patient taking differently: Take 350 mg by mouth in the morning, at noon, and at bedtime.)   cefadroxil (DURICEF) 500 MG capsule, Take 2 capsules (1,000  mg total) by mouth 2 (two) times daily for 8 days.   Cholecalciferol (VITAMIN D3) 1000 units CAPS, Take 1,000 Units by mouth daily.   Cholecalciferol 100 MCG (4000 UT) CAPS, 1 tab by mouth once daily (Patient not taking: Reported on 11/20/2022)   doxepin (SINEQUAN) 10 MG capsule, TAKE 2 CAPSULES (20 MG TOTAL) BY MOUTH AT BEDTIME AS NEEDED. (Patient taking differently: Take 20 mg by mouth at bedtime.)   DULoxetine (CYMBALTA) 60 MG capsule, Take 60 mg by mouth daily.   Dupilumab (DUPIXENT) 300 MG/2ML SOPN, Inject 300 mg into the skin every 14 (fourteen) days.   hydrOXYzine (ATARAX/VISTARIL) 10 MG tablet, TAKE 1 TABLET BY MOUTH 3 TIMES DAILY AS NEEDED FOR ITCHING. (Patient taking differently: Take 5 mg by mouth 3 (three) times daily as needed for itching.)   ketoconazole (NIZORAL) 2 % cream, Apply 1 Application topically daily. (Patient taking differently: Apply 1 Application topically daily as needed for irritation.)   KLOR-CON M20 20 MEQ tablet, TAKE 1 TABLET BY MOUTH EVERY DAY (Patient taking differently: Take 40 mEq by mouth daily.)   mupirocin ointment (BACTROBAN) 2 %, Place 1 Application into the nose 2 (two) times daily.   omeprazole (PRILOSEC) 20 MG capsule, TAKE 1 CAPSULE BY MOUTH TWICE A DAY (Patient taking differently: Take 20 mg by mouth See admin instructions. Take 20 mg by mouth in the morning before breakfast and an additional 20 mg later in the day, if no relief from reflux)   ondansetron (ZOFRAN) 4 MG tablet, Take 1 tablet (4 mg total) by mouth every 6 (six) hours as needed for nausea.   Respiratory Therapy Supplies (FULL  KIT NEBULIZER SET) MISC, 1 each by Does not apply route as needed.   senna-docusate (SENOKOT-S) 8.6-50 MG tablet, Take 1 tablet by mouth at bedtime as needed for mild constipation.   silver sulfADIAZINE (SILVADENE) 1 % cream, APPLY TOPICALLY TO AFFECTED AREA EVERY DAY (Patient taking differently: Apply 1 Application topically daily as needed (to wound sites that won't  heal).)   tamsulosin (FLOMAX) 0.4 MG CAPS capsule, TAKE 1 CAPSULE BY MOUTH EVERY DAY (Patient taking differently: Take 0.4 mg by mouth every evening.)   traZODone (DESYREL) 100 MG tablet, TAKE 2 TABLETS BY MOUTH AT BEDTIME AS NEEDED (Patient taking differently: Take 200 mg by mouth at bedtime.)   TUMS 500 MG chewable tablet, Chew 500-2,000 mg by mouth every 4 (four) hours as needed for indigestion or heartburn.  (Patient not taking: Reported on 11/26/2022)   venlafaxine XR (EFFEXOR XR) 150 MG 24 hr capsule, Take 1 capsule (150 mg total) by mouth daily with breakfast. (Patient taking differently: Take 150 mg by mouth daily.)   zolpidem (AMBIEN) 10 MG tablet, TAKE 1 TABLET BY MOUTH EVERY DAY AT BEDTIME AS NEEDED (Patient taking differently: Take 10 mg by mouth at bedtime.)   Reviewed prior external information including notes and imaging from  primary care provider As well as notes that were available from care everywhere and other healthcare systems.  Past medical history, social, surgical and family history all reviewed in electronic medical record.  No pertanent information unless stated regarding to the chief complaint.   Review of Systems:  No headache, visual changes, nausea, vomiting, diarrhea, constipation, dizziness, abdominal pain, skin rash, fevers, chills, night sweats, weight loss, swollen lymph nodes,  joint swelling, chest pain, shortness of breath, mood changes. POSITIVE muscle aches, body aches  Objective  Blood pressure 118/78, pulse 78, height 5' 10"$  (1.778 m), weight 277 lb (125.6 kg), SpO2 93 %.   General: No apparent distress alert and oriented x3 mood and affect normal, dressed appropriately.  HEENT: Pupils equal, extraocular movements intact  Respiratory: Patient's speak in full sentences and does not appear short of breath  1+ pitting edema but compression socks noted. Patient shoulders do have atrophy noted.  Severe crepitus noted.  Tender to palpation noted  diffusely.  Procedure: Real-time Ultrasound Guided Injection of right glenohumeral joint Device: GE Logiq Q7  Ultrasound guided injection is preferred based studies that show increased duration, increased effect, greater accuracy, decreased procedural pain, increased response rate with ultrasound guided versus blind injection.  Verbal informed consent obtained.  Time-out conducted.  Noted no overlying erythema, induration, or other signs of local infection.  Skin prepped in a sterile fashion.  Local anesthesia: Topical Ethyl chloride.  With sterile technique and under real time ultrasound guidance:  Joint visualized.  23g 1  inch needle inserted posterior approach. Pictures taken for needle placement. Patient did have injection of 2 cc of 0.5% Marcaine, and 1.0 cc of Kenalog 40 mg/dL. Completed without difficulty  Pain immediately resolved suggesting accurate placement of the medication.  Advised to call if fevers/chills, erythema, induration, drainage, or persistent bleeding.  Impression: Technically successful ultrasound guided injection.  Procedure: Real-time Ultrasound Guided Injection of left glenohumeral joint Device: GE Logiq E  Ultrasound guided injection is preferred based studies that show increased duration, increased effect, greater accuracy, decreased procedural pain, increased response rate with ultrasound guided versus blind injection.  Verbal informed consent obtained.  Time-out conducted.  Noted no overlying erythema, induration, or other signs of local infection.  Skin prepped  in a sterile fashion.  Local anesthesia: Topical Ethyl chloride.  With sterile technique and under real time ultrasound guidance:  Joint visualized.  21g 2 inch needle inserted posterior approach. Pictures taken for needle placement. Patient did have injection of 2 cc of 0.5% Marcaine, and 1cc of Kenalog 40 mg/dL. Completed without difficulty  Pain immediately resolved suggesting accurate placement  of the medication.  Advised to call if fevers/chills, erythema, induration, drainage, or persistent bleeding.  Impression: Technically successful ultrasound guided injection.    Impression and Recommendations:    The above documentation has been reviewed and is accurate and complete Lyndal Pulley, DO

## 2022-11-29 ENCOUNTER — Ambulatory Visit (HOSPITAL_COMMUNITY): Payer: Medicare HMO

## 2022-11-29 ENCOUNTER — Ambulatory Visit (INDEPENDENT_AMBULATORY_CARE_PROVIDER_SITE_OTHER): Payer: Medicare HMO

## 2022-11-29 ENCOUNTER — Ambulatory Visit: Payer: Self-pay

## 2022-11-29 ENCOUNTER — Ambulatory Visit: Payer: Medicare HMO | Admitting: Family Medicine

## 2022-11-29 VITALS — BP 118/78 | HR 78 | Ht 70.0 in | Wt 277.0 lb

## 2022-11-29 DIAGNOSIS — R079 Chest pain, unspecified: Secondary | ICD-10-CM

## 2022-11-29 DIAGNOSIS — G8929 Other chronic pain: Secondary | ICD-10-CM | POA: Diagnosis not present

## 2022-11-29 DIAGNOSIS — M12812 Other specific arthropathies, not elsewhere classified, left shoulder: Secondary | ICD-10-CM

## 2022-11-29 DIAGNOSIS — M25512 Pain in left shoulder: Secondary | ICD-10-CM | POA: Diagnosis not present

## 2022-11-29 DIAGNOSIS — M25511 Pain in right shoulder: Secondary | ICD-10-CM | POA: Diagnosis not present

## 2022-11-29 DIAGNOSIS — M12811 Other specific arthropathies, not elsewhere classified, right shoulder: Secondary | ICD-10-CM

## 2022-11-29 DIAGNOSIS — J811 Chronic pulmonary edema: Secondary | ICD-10-CM | POA: Diagnosis not present

## 2022-11-29 MED ORDER — PREDNISONE 10 MG PO TABS: 10.0000 mg | ORAL_TABLET | Freq: Every day | ORAL | 0 refills | Status: DC

## 2022-11-29 NOTE — Assessment & Plan Note (Signed)
Chronic problem with worsening symptoms.  Does have end-stage osteoarthritic changes of the shoulders bilaterally.  Due to patient's other chronic illnesses difficult to be treated.  Patient wants to hold on any type of surgical intervention.  Can repeat every 10 weeks.

## 2022-11-29 NOTE — Patient Instructions (Addendum)
Injection in shoulders Xray today See you again in 2 months

## 2022-12-03 ENCOUNTER — Encounter: Payer: Self-pay | Admitting: Internal Medicine

## 2022-12-03 ENCOUNTER — Ambulatory Visit: Payer: Medicare HMO | Attending: Internal Medicine | Admitting: Internal Medicine

## 2022-12-03 VITALS — BP 114/68 | HR 73 | Ht 70.0 in | Wt 280.8 lb

## 2022-12-03 DIAGNOSIS — I5022 Chronic systolic (congestive) heart failure: Secondary | ICD-10-CM | POA: Diagnosis not present

## 2022-12-03 DIAGNOSIS — R0789 Other chest pain: Secondary | ICD-10-CM | POA: Diagnosis not present

## 2022-12-03 DIAGNOSIS — Z95810 Presence of automatic (implantable) cardiac defibrillator: Secondary | ICD-10-CM

## 2022-12-03 DIAGNOSIS — I4821 Permanent atrial fibrillation: Secondary | ICD-10-CM | POA: Diagnosis not present

## 2022-12-03 NOTE — Progress Notes (Signed)
He was      Patient Care Team: [name], MD as PCP - General (Internal Medicine) Larey Dresser, MD as PCP - Advanced Heart Failure (Cardiology) Deboraha Sprang, MD as PCP - Electrophysiology (Cardiology) Himmelrich, Bryson Ha, RD (Inactive) as Dietitian Deboraha Sprang, MD as Consulting Physician (Cardiology) Mcarthur Rossetti, MD as Consulting Physician (Orthopedic Surgery) Ruthy Dick, MD as Referring Physician (Ophthalmology)   HPI  [name] is a 67 y.o. male seen in followup for CRT Medtronic  Implantation 2018     Had had syncope.  ILR had documented pauses greater than 6 seconds.  Not withstanding his right bundle branch block, it was very wide and so discussions with Dr. DM we elected CRT-D implantation.    He also has atrial fibrillation for which he was treated with amiodarone.  Having had recurrent and persistence of atrial fibrillation he underwent AV junction ablation following CRT implantation  He uses oxygen but not CPAP for his sleep apnea   Interval hospitalization with lower extremity cellulitis treated with cephalosporins  following discharge he has had recurrence of weeping from his right leg.  And has bilateral lower extremity erythema  Also asked pain underneath his left breast, aggravated by coughing tender to touch.  Saw the sports medicine doctor who did an ultrasound and said there is dried blood underneath.  No recollection of trauma or coughing  DATE TEST EF    /2012 myoview   44 Prior IMI perfsion defect  /2015 myoview    57% Perfusion defect--MI vs bowel  2016 myoview 35-40 NO perfusion defect  6/16 Echo 40-45 Severe BAE  6/17 Cath 30 no CAD   3/18 Echo 20-25%   4/18 LHC/RHC  No CAD  6/19  Echo  20-25%   1/21 Echo  35-40   1/24 Echo  40%    Antiarrhythmics Date  dofetilide 2013  amidoarone 2016   Date Cr K Dig Hgb  8/18 1.22 4.5  12.2  2/19  1.09 4.1 0.4   2/20 1.32 4.5    3/21 1.08 4.0  13.1  2/24 1.45 3.2  13.2        Past Medical History:  Diagnosis Date   AICD (automatic cardioverter/defibrillator) present    Anemia    supposed to be taking Vit B but doesn't   ANXIETY    takes Xanax nightly   Arthritis    Asthma    Albuterol prn and Advair daily;also takes Prednisone daily   Cardiomyopathy ([place])    a. EF 25% TEE July 2013; b. EF normalized 2015;  c. 03/2015 Echo: EF 40-45%, difrf HK, PASP 38 mmHg, Mild MR, sev LAE/RAE.   Chronic constipation    takes OTC stool softener   COPD (chronic obstructive pulmonary disease) (HCC)    "one dr says COPD; one dr says emphysema" (09/18/2017)   DEPRESSION    takes Zoloft and Doxepin daily   Diverticulitis    DYSKINESIA, ESOPHAGUS    Essential hypertension        FIBROMYALGIA    GERD (gastroesophageal reflux disease)        Glaucoma    HYPERLIPIDEMIA    a. Intolerant to statins.   INSOMNIA    takes Ambien nightly   Myocardial infarction [facility])    a. 2012 Myoview notable for prior infarct;  b. 03/2015 Lexiscan CL: EF 37%, diff HK, small area of inferior infarct from apex to base-->Med Rx.   O2 dependent    "  2.5L q hs & prn" (09/18/2017)   Paroxysmal atrial fibrillation ([place])    a. CHA2DS2VASc = 3--> takes Coumadin;  b. 03/15/2015 Successful TEE/DCCV;  c. 03/2015 recurrent afib, Amio d/c'd in setting of hyperthyroidism.   Peripheral neuropathy    Pneumonia 12/2016   Rash and other nonspecific skin eruption 04/12/2009   no cause found saw dermatologists x 2 and allergist   SLEEP APNEA, OBSTRUCTIVE    a. doesn't use CPAP   Syncope    a. 03/2015 s/p MDT LINQ.   Type II diabetes mellitus ([place])         Past Surgical History:  Procedure Laterality Date   ACNE CYST REMOVAL     2 on back    AV NODE ABLATION N/A 10/25/2017   Procedure: AV NODE ABLATION;  Surgeon: Deboraha Sprang, MD;  Location: [place] [facility];  Service: Cardiovascular;  Laterality: N/A;   BIV ICD INSERTION CRT-D N/A 09/18/2017   Procedure: BIV ICD INSERTION CRT-D;  Surgeon:  Deboraha Sprang, MD;  Location: [place] [facility];  Service: Cardiovascular;  Laterality: N/A;   CARDIAC CATHETERIZATION N/A 03/21/2016   Procedure: Right/Left Heart Cath and Coronary Angiography;  Surgeon: Larey Dresser, MD;  Location: [place] [facility];  Service: Cardiovascular;  Laterality: N/A;   CARDIOVERSION  04/18/2012   Procedure: CARDIOVERSION;  Surgeon: Fay Records, MD;  Location: [place];  Service: Cardiovascular;  Laterality: N/A;   CARDIOVERSION  04/25/2012   Procedure: CARDIOVERSION;  Surgeon: Thayer Headings, MD;  Location: [place];  Service: Cardiovascular;  Laterality: N/A;   CARDIOVERSION  04/25/2012   Procedure: CARDIOVERSION;  Surgeon: Fay Records, MD;  Location: [place];  Service: Cardiovascular;  Laterality: N/A;   CARDIOVERSION  05/09/2012   Procedure: CARDIOVERSION;  Surgeon: Sherren Mocha, MD;  Location: [place];  Service: Cardiovascular;  Laterality: N/A;  changed from crenshaw to cooper by trish/leone-endo   CARDIOVERSION N/A 03/15/2015   Procedure: CARDIOVERSION;  Surgeon: Thayer Headings, MD;  Location: [place];  Service: Cardiovascular;  Laterality: N/A;   COLONOSCOPY     COLONOSCOPY WITH PROPOFOL N/A 10/21/2014   Procedure: COLONOSCOPY WITH PROPOFOL;  Surgeon: Ladene Artist, MD;  Location: [facility];  Service: Endoscopy;  Laterality: N/A;   EP IMPLANTABLE DEVICE N/A 04/06/2015   Procedure: Loop Recorder Insertion;  Surgeon: Evans Lance, MD;  Location: [place] [facility];  Service: Cardiovascular;  Laterality: N/A;   ESOPHAGOGASTRODUODENOSCOPY     JOINT REPLACEMENT     LOOP RECORDER REMOVAL N/A 09/18/2017   Procedure: LOOP RECORDER REMOVAL;  Surgeon: Deboraha Sprang, MD;  Location: [place] [facility];  Service: Cardiovascular;  Laterality: N/A;   RIGHT/LEFT HEART CATH AND CORONARY ANGIOGRAPHY N/A 01/28/2017   Procedure: Right/Left Heart Cath and Coronary Angiography;  Surgeon: Larey Dresser, MD;  Location: [place] [facility];  Service: Cardiovascular;   Laterality: N/A;   TEE WITHOUT CARDIOVERSION  04/25/2012   Procedure: TRANSESOPHAGEAL ECHOCARDIOGRAM (TEE);  Surgeon: Thayer Headings, MD;  Location: [place];  Service: Cardiovascular;  Laterality: N/A;   TEE WITHOUT CARDIOVERSION N/A 03/15/2015   Procedure: TRANSESOPHAGEAL ECHOCARDIOGRAM (TEE);  Surgeon: Thayer Headings, MD;  Location: [place];  Service: Cardiovascular;  Laterality: N/A;   TONSILLECTOMY AND ADENOIDECTOMY     TOTAL KNEE ARTHROPLASTY Right 06/15/2014   Procedure: TOTAL KNEE ARTHROPLASTY;  Surgeon: Renette Butters, MD;  Location: [place];  Service: Orthopedics;  Laterality: Right;   TOTAL KNEE ARTHROPLASTY Left 10/17/2021  Procedure: Left TOTAL KNEE ARTHROPLASTY;  Surgeon: Mcarthur Rossetti, MD;  Location: [place];  Service: Orthopedics;  Laterality: Left;    Current Outpatient Medications  Medication Sig Dispense Refill   acetaminophen (TYLENOL) 500 MG tablet Take 500-1,000 mg by mouth every 6 (six) hours as needed for mild pain or headache.     albuterol (VENTOLIN HFA) 108 (90 Base) MCG/ACT inhaler INHALE 2 PUFFS BY MOUTH 4 TIMES A DAY AS NEEDED (Patient taking differently: Inhale 2 puffs into the lungs 4 (four) times daily as needed for wheezing or shortness of breath (or COPD exacerbation).) 54 each 3   ALPRAZolam (XANAX) 0.5 MG tablet Take 2 tablets (1 mg total) by mouth at bedtime. 60 tablet 5   Aspirin-Acetaminophen-Caffeine (GOODY HEADACHE PO) Take 1 packet by mouth every 6 (six) hours as needed (for pain).     augmented betamethasone dipropionate (DIPROLENE-AF) 0.05 % cream Apply 1 application  topically daily as needed (to affected areas- for itching).     carisoprodol (SOMA) 350 MG tablet TAKE 1 TABLET BY MOUTH THREE TIMES A DAY AS NEEDED FOR MUSCLE SPASM (Patient taking differently: Take 350 mg by mouth in the morning, at noon, and at bedtime.) 90 tablet 2   carvedilol (COREG) 6.25 MG tablet TAKE 1 TABLET BY MOUTH TWICE A DAY 180 tablet 3   Cholecalciferol 100  MCG (4000 UT) CAPS 1 tab by mouth once daily 30 capsule 99   dapagliflozin propanediol (FARXIGA) 10 MG TABS tablet Take 1 tablet (10 mg total) by mouth daily before breakfast. 90 tablet 3   doxepin (SINEQUAN) 10 MG capsule TAKE 2 CAPSULES (20 MG TOTAL) BY MOUTH AT BEDTIME AS NEEDED. (Patient taking differently: Take 20 mg by mouth at bedtime.) 180 capsule 1   DULoxetine (CYMBALTA) 60 MG capsule Take 60 mg by mouth daily.     Dupilumab (DUPIXENT) 300 MG/2ML SOPN Inject 300 mg into the skin every 14 (fourteen) days.     ELIQUIS 5 MG TABS tablet TAKE 1 TABLET BY MOUTH TWICE A DAY (Patient taking differently: Take 5 mg by mouth in the morning.) 60 tablet 11   eplerenone (INSPRA) 50 MG tablet Take 1 tablet (50 mg total) by mouth daily. 90 tablet 3   Evolocumab (REPATHA SURECLICK) [id number] MG/ML SOAJ INJECT 1 PEN INTO THE SKIN EVERY 14 (FOURTEEN) DAYS. (Patient taking differently: Inject 140 mg into the skin every 14 (fourteen) days.) 6 mL 3   fluticasone (FLONASE) 50 MCG/ACT nasal spray PLACE 2 SPRAYS INTO BOTH NOSTRILS DAILY AS NEEDED FOR ALLERGIES OR RHINITIS. 48 mL 1   Fluticasone-Umeclidin-Vilant (TRELEGY ELLIPTA) 100-62.5-25 MCG/ACT AEPB Inhale 1 puff into the lungs daily. 180 each 2   hydrOXYzine (ATARAX/VISTARIL) 10 MG tablet TAKE 1 TABLET BY MOUTH 3 TIMES DAILY AS NEEDED FOR ITCHING. (Patient taking differently: Take 5 mg by mouth 3 (three) times daily as needed for itching.) 60 tablet 2   ipratropium-albuterol (DUONEB) 0.5-2.5 (3) MG/3ML SOLN TAKE 3 ML BY NEBULIZATION EVERY 4 HOURS AS NEEDED (WHEEZING). (Patient taking differently: Take 3 mLs by nebulization every 4 (four) hours as needed (for wheezing or COPD symptoms).) 360 mL 0   ketoconazole (NIZORAL) 2 % cream Apply 1 Application topically daily. (Patient taking differently: Apply 1 Application topically daily as needed for irritation.) 120 g 1   KLOR-CON M20 20 MEQ tablet TAKE 1 TABLET BY MOUTH EVERY DAY (Patient taking differently: Take 40 mEq by  mouth daily.) 90 tablet 3   losartan (COZAAR) 25 MG tablet Take  1 tablet (25 mg total) by mouth daily. (Patient taking differently: Take 12.5 mg by mouth every evening.) 30 tablet 11   mupirocin ointment (BACTROBAN) 2 % Place 1 Application into the nose 2 (two) times daily. 22 g 0   omeprazole (PRILOSEC) 20 MG capsule TAKE 1 CAPSULE BY MOUTH TWICE A DAY (Patient taking differently: Take 20 mg by mouth See admin instructions. Take 20 mg by mouth in the morning before breakfast and an additional 20 mg later in the day, if no relief from reflux) 180 capsule 1   ondansetron (ZOFRAN) 4 MG tablet Take 1 tablet (4 mg total) by mouth every 6 (six) hours as needed for nausea. 20 tablet 0   predniSONE (DELTASONE) 10 MG tablet Take 1 tablet (10 mg total) by mouth daily with breakfast. 30 tablet 0   Respiratory Therapy Supplies (FULL KIT NEBULIZER SET) MISC 1 each by Does not apply route as needed. 1 each 0   senna-docusate (SENOKOT-S) 8.6-50 MG tablet Take 1 tablet by mouth at bedtime as needed for mild constipation. 30 tablet 0   silver sulfADIAZINE (SILVADENE) 1 % cream APPLY TOPICALLY TO AFFECTED AREA EVERY DAY (Patient taking differently: Apply 1 Application topically daily as needed (to wound sites that won't heal).) 50 g 3   tamsulosin (FLOMAX) 0.4 MG CAPS capsule TAKE 1 CAPSULE BY MOUTH EVERY DAY (Patient taking differently: Take 0.4 mg by mouth every evening.) 90 capsule 3   torsemide (DEMADEX) 20 MG tablet Take 4 tablets (80 mg total) by mouth See admin instructions. Take 80 mg by mouth in the morning and evening; HOLD For a few days and resume next week with close follow up with Dr. [name]     traMADol (ULTRAM) 50 MG tablet TAKE 1 TABLET BY MOUTH EVERY DAY AS NEEDED (Patient taking differently: Take 25 mg by mouth daily as needed for moderate pain.) 30 tablet 2   traZODone (DESYREL) 100 MG tablet TAKE 2 TABLETS BY MOUTH AT BEDTIME AS NEEDED (Patient taking differently: Take 200 mg by mouth at bedtime.)  180 tablet 1   TUMS 500 MG chewable tablet Chew 500-2,000 mg by mouth every 4 (four) hours as needed for indigestion or heartburn.     venlafaxine XR (EFFEXOR XR) 150 MG 24 hr capsule Take 1 capsule (150 mg total) by mouth daily with breakfast. (Patient taking differently: Take 150 mg by mouth daily.) 90 capsule 3   zolpidem (AMBIEN) 10 MG tablet TAKE 1 TABLET BY MOUTH EVERY DAY AT BEDTIME AS NEEDED (Patient taking differently: Take 10 mg by mouth at bedtime.) 90 tablet 1   Cholecalciferol (VITAMIN D3) 1000 units CAPS Take 1,000 Units by mouth daily.     No current facility-administered medications for this visit.    Allergies  Allergen Reactions   Tape Other (See Comments)    SKIN TEARS VERY, VERY EASILY!!!! Use Paper Tape Only, PLEASE!!!   Amiodarone Other (See Comments)    Caused hyperthyroidism    Statins Other (See Comments)    Myalgias     Review of Systems negative except from HPI and PMH  Physical Exam BP 114/68   Pulse 73   Ht 5' 10"$  (1.778 m)   Wt 280 lb 12.8 oz (127.4 kg)   SpO2 96%   BMI 40.29 kg/m  Well developed and Morbidly obese in no acute distress HENT normal Neck supple   Clear Tenderness on his ribs underneath his left breast to touch and aggravated by coughing Device pocket well healed;  without hematoma or erythema.  There is no tethering  Regular rate and rhythm, no  gallop No  murmur Abd-soft with active BS No Clubbing cyanosis trace edema weeping lesion on the posteromedial calf erythema Skin-warm and dry A & Oriented  Grossly normal sensory and motor function  ECG atrial fibrillation with ventricular pacing at 73 Negative QRS in lead I and RS lead V1  Device function is normal. Programming changes   See Paceart for details    Assessment and  Plan  Ischemic heart disease without significant obstructive disease by catheterization 6/17  HFrEF    CRT-D-Medtronic   Cardiomyopathy-ischemic/nonischemic  Atrial fibrillation  Permanent    Complete heart block status post AV junction ablation  Syncope   Morbidly obese  Cellulitis  Chest wall pain    Device function is normal.  Volume status is stable.  OptiVol is improving.  Continue Demadex and Inspra.  Blood pressure well-controlled, continue Inspra, carvedilol.  No bleeding continue Eliquis.  I am worried about the cellulitis, I have reached out to Dr. Baxter Flattery who saw him as an inpatient, as to next steps.  Will hold off on antibiotics until we get her recommendation  With rib pain, will get an x-ray.  Will be concerned as to the cause in the absence of trauma.  He is to see his PCP in about 10 days.  Will defer further evaluation to him  .

## 2022-12-03 NOTE — Patient Instructions (Addendum)
Medication Instructions:  Your physician recommends that you continue on your current medications as directed. Please refer to the Current Medication list given to you today.  *If you need a refill on your cardiac medications before your next appointment, please call your pharmacy*   Lab Work: None ordered.  If you have labs (blood work) drawn today and your tests are completely normal, you will receive your results only by: [place] (if you have MyChart) OR A paper copy in the mail If you have any lab test that is abnormal or we need to change your treatment, we will call you to review the results.   Testing/Procedures: Dr [name] would like for you to have an xray of your ribs on the left side.  You may have this completed at [facility] Radiology at [facility]   Follow-Up: At [facility], you and your health needs are our priority.  As part of our continuing mission to provide you with exceptional heart care, we have created designated Provider Care Teams.  These Care Teams include your primary Cardiologist (physician) and Advanced Practice Providers (APPs -  Physician Assistants and Nurse Practitioners) who all work together to provide you with the care you need, when you need it.  We recommend signing up for the patient portal called "MyChart".  Sign up information is provided on this After Visit Summary.  MyChart is used to connect with patients for Virtual Visits (Telemedicine).  Patients are able to view lab/test results, encounter notes, upcoming appointments, etc.  Non-urgent messages can be sent to your provider as well.   To learn more about what you can do with MyChart, go to [email or web address].    Your next appointment:   12 months with Dr [name]

## 2022-12-04 ENCOUNTER — Other Ambulatory Visit: Payer: Self-pay | Admitting: Internal Medicine

## 2022-12-04 ENCOUNTER — Ambulatory Visit (HOSPITAL_COMMUNITY): Payer: Medicare HMO

## 2022-12-04 MED ORDER — DOXYCYCLINE HYCLATE 100 MG PO TABS: 100.0000 mg | ORAL_TABLET | Freq: Two times a day (BID) | ORAL | 0 refills | Status: DC

## 2022-12-04 NOTE — Progress Notes (Signed)
Currently on cefadroxil, will add doxy for mrsa coverage for cellulitis

## 2022-12-05 ENCOUNTER — Telehealth: Payer: Self-pay | Admitting: Internal Medicine

## 2022-12-05 NOTE — Telephone Encounter (Signed)
Moji with [employer] states on 2/15 a medication recommendations report was faxed to the office. She would like to confirm whether this has been received and reviewed with the patient. Please advise.  [phone] (ext: [phone])

## 2022-12-06 ENCOUNTER — Ambulatory Visit (HOSPITAL_COMMUNITY): Payer: Medicare HMO

## 2022-12-10 ENCOUNTER — Telehealth (HOSPITAL_COMMUNITY): Payer: Self-pay | Admitting: Pharmacist

## 2022-12-10 NOTE — Telephone Encounter (Signed)
Patient called and left voicemail asking for help identifying the prescription bottle that he has misplaced. Returned a call with no reply. Left voicemail with instructions to call back.

## 2022-12-10 NOTE — Telephone Encounter (Signed)
Called patient again. He reports that he has misplaced one one of his medications again, but does not know which medicine. He is only able to describe the pill color. Will call patient tomorrow and review his medications when he has them nearby so that the misplaced medication can be determined. He confirmed that the medication is not his Eliquis.

## 2022-12-11 ENCOUNTER — Ambulatory Visit (HOSPITAL_COMMUNITY): Payer: Medicare HMO

## 2022-12-11 DIAGNOSIS — L2089 Other atopic dermatitis: Secondary | ICD-10-CM | POA: Diagnosis not present

## 2022-12-12 ENCOUNTER — Other Ambulatory Visit: Payer: Self-pay | Admitting: Internal Medicine

## 2022-12-12 ENCOUNTER — Ambulatory Visit
Admission: RE | Admit: 2022-12-12 | Discharge: 2022-12-12 | Disposition: A | Discharge status: Home / Self Care | Payer: Medicare HMO | Source: Ambulatory Visit | Attending: Internal Medicine | Admitting: Internal Medicine

## 2022-12-12 ENCOUNTER — Telehealth (HOSPITAL_COMMUNITY): Payer: Self-pay | Admitting: Licensed Clinical Social Worker

## 2022-12-12 DIAGNOSIS — S2242XA Multiple fractures of ribs, left side, initial encounter for closed fracture: Secondary | ICD-10-CM | POA: Diagnosis not present

## 2022-12-12 DIAGNOSIS — Z950 Presence of cardiac pacemaker: Secondary | ICD-10-CM | POA: Diagnosis not present

## 2022-12-12 DIAGNOSIS — R0789 Other chest pain: Secondary | ICD-10-CM

## 2022-12-12 DIAGNOSIS — I4821 Permanent atrial fibrillation: Secondary | ICD-10-CM

## 2022-12-12 DIAGNOSIS — I5022 Chronic systolic (congestive) heart failure: Secondary | ICD-10-CM

## 2022-12-12 DIAGNOSIS — I517 Cardiomegaly: Secondary | ICD-10-CM | POA: Diagnosis not present

## 2022-12-12 DIAGNOSIS — Z95810 Presence of automatic (implantable) cardiac defibrillator: Secondary | ICD-10-CM

## 2022-12-12 MED ORDER — EPLERENONE 50 MG PO TABS: 50.0000 mg | ORAL_TABLET | Freq: Every day | ORAL | 3 refills | Status: DC

## 2022-12-12 NOTE — Telephone Encounter (Signed)
H&V Care Navigation CSW Progress Note  Clinical Social Worker consulted to speak with pt regarding paramedicine program  Pt has been on previously and understands what is involved in the program.  Pt admits to struggling with organizing and understanding his meds and is agreeable to enrollment at this time.  SDOH Screenings   Food Insecurity: No Food Insecurity (11/26/2022)  Housing: Low Risk  (11/22/2022)  Recent Concern: Housing - Medium Risk (09/10/2022)  Transportation Needs: No Transportation Needs (11/26/2022)  Utilities: Not At Risk (11/22/2022)  Alcohol Screen: Low Risk  (06/29/2022)  Depression (PHQ2-9): Low Risk  (06/29/2022)  Recent Concern: Depression (PHQ2-9) - High Risk (05/17/2022)  Financial Resource Strain: Low Risk  (06/29/2022)  Physical Activity: Inactive (06/29/2022)  Social Connections: Moderately Integrated (06/29/2022)  Stress: No Stress Concern Present (06/29/2022)  Tobacco Use: Medium Risk (12/03/2022)    Paramedicine Initial Assessment:  Housing:  In what kind of housing do you live? House/apt/trailer/shelter? Apartment- has not been staying there after DC from the hospital however- has been staying with friends in [facility] or [place]  Do you rent/pay a mortgage/own? rent  Do you live with anyone? self  Are you currently worried about losing your housing? No- has voucher with [employer] housing   Social:  What is your current marital status? unmarried  Do you have any children?a son and a dtr. Dtr was listed as emergency contact but pt stated we should take her off that they haven't spoken in months.  Do you have family or friends who live locally? "Significant other"   Food:  Doing ok getting food at this time.  Income:  What is your current source of income? $1740/month  How hard is it for you to pay for the basics like food housing, medical care, and utilities? No concerns at this time- states bills have been high but that he has managed to cover  everything.  Insurance:  Are you currently insured? [employer] Medicare- had special help from the government but lost   Do you have prescription coverage? yes  Transportation:  Do you have transportation to your medical appointments? Drives himself and has his own car   Daily Health Needs: Do you have a working scale at home? yes  How do you manage your medications at home? Tries to organize meds in bags but it has gotten confusing  Do you ever take your medications differently than prescribed? Not intentionally  Do you have issues affording your medications? Not at this time  If yes, has this ever prevented you from obtaining medications? no  Do you have any concerns with mobility at home? Cane and walker  Do you have a PCP? Dr. [name]  Do you have any trouble reading or writing? yes  Do you currently use tobacco products or have recently quit? Years ago  Do you see any mental health providers? no  Are there any additional barriers you see to getting the care you need? no  CSW will continue to follow through paramedicine program and assist as needed.    Jorge Ny, LCSW Clinical Social Worker Advanced Heart Failure Clinic Desk#: [phone] Cell#: [phone]

## 2022-12-12 NOTE — Telephone Encounter (Signed)
Attempted to call patient 2/27 and again on 2/28 with no reply. Left voicemail asking to return the call.

## 2022-12-12 NOTE — Addendum Note (Signed)
Addended by: Ursula Beath on: 12/12/2022 01:03 PM   Modules accepted: Orders

## 2022-12-12 NOTE — Telephone Encounter (Signed)
Spoke with patient about his missing medication bottle. He read off every medication bottle he had at home and found that he is missing eplerenone. Will send in a new prescription.

## 2022-12-13 ENCOUNTER — Ambulatory Visit (HOSPITAL_COMMUNITY): Payer: Medicare HMO

## 2022-12-17 ENCOUNTER — Ambulatory Visit: Payer: Medicare HMO | Attending: Internal Medicine

## 2022-12-17 DIAGNOSIS — I5022 Chronic systolic (congestive) heart failure: Secondary | ICD-10-CM

## 2022-12-17 DIAGNOSIS — Z95810 Presence of automatic (implantable) cardiac defibrillator: Secondary | ICD-10-CM

## 2022-12-18 ENCOUNTER — Encounter: Payer: Self-pay | Admitting: Internal Medicine

## 2022-12-18 ENCOUNTER — Other Ambulatory Visit: Payer: Self-pay

## 2022-12-18 ENCOUNTER — Other Ambulatory Visit (HOSPITAL_COMMUNITY): Payer: Self-pay

## 2022-12-18 ENCOUNTER — Ambulatory Visit: Payer: Medicare HMO | Admitting: Internal Medicine

## 2022-12-18 ENCOUNTER — Ambulatory Visit (HOSPITAL_COMMUNITY)
Admission: RE | Admit: 2022-12-18 | Discharge: 2022-12-18 | Disposition: A | Discharge status: Home / Self Care | Payer: Medicare HMO | Source: Ambulatory Visit | Attending: Family Medicine | Admitting: Family Medicine

## 2022-12-18 ENCOUNTER — Encounter (HOSPITAL_COMMUNITY): Payer: Self-pay

## 2022-12-18 ENCOUNTER — Ambulatory Visit (HOSPITAL_COMMUNITY): Payer: Medicare HMO

## 2022-12-18 VITALS — BP 101/65 | HR 74 | Temp 97.2°F | Resp 16 | Wt 280.2 lb

## 2022-12-18 VITALS — BP 104/64 | HR 71 | Wt 283.0 lb

## 2022-12-18 DIAGNOSIS — I110 Hypertensive heart disease with heart failure: Secondary | ICD-10-CM | POA: Diagnosis not present

## 2022-12-18 DIAGNOSIS — I442 Atrioventricular block, complete: Secondary | ICD-10-CM

## 2022-12-18 DIAGNOSIS — G4733 Obstructive sleep apnea (adult) (pediatric): Secondary | ICD-10-CM

## 2022-12-18 DIAGNOSIS — J449 Chronic obstructive pulmonary disease, unspecified: Secondary | ICD-10-CM | POA: Diagnosis not present

## 2022-12-18 DIAGNOSIS — Z79899 Other long term (current) drug therapy: Secondary | ICD-10-CM

## 2022-12-18 DIAGNOSIS — I2510 Atherosclerotic heart disease of native coronary artery without angina pectoris: Secondary | ICD-10-CM | POA: Diagnosis not present

## 2022-12-18 DIAGNOSIS — Z95810 Presence of automatic (implantable) cardiac defibrillator: Secondary | ICD-10-CM | POA: Diagnosis not present

## 2022-12-18 DIAGNOSIS — Z7901 Long term (current) use of anticoagulants: Secondary | ICD-10-CM | POA: Diagnosis not present

## 2022-12-18 DIAGNOSIS — L03115 Cellulitis of right lower limb: Secondary | ICD-10-CM

## 2022-12-18 DIAGNOSIS — J4489 Other specified chronic obstructive pulmonary disease: Secondary | ICD-10-CM | POA: Insufficient documentation

## 2022-12-18 DIAGNOSIS — I4821 Permanent atrial fibrillation: Secondary | ICD-10-CM

## 2022-12-18 DIAGNOSIS — I5022 Chronic systolic (congestive) heart failure: Secondary | ICD-10-CM

## 2022-12-18 DIAGNOSIS — R7989 Other specified abnormal findings of blood chemistry: Secondary | ICD-10-CM | POA: Diagnosis not present

## 2022-12-18 DIAGNOSIS — L03119 Cellulitis of unspecified part of limb: Secondary | ICD-10-CM | POA: Diagnosis not present

## 2022-12-18 DIAGNOSIS — I428 Other cardiomyopathies: Secondary | ICD-10-CM | POA: Diagnosis not present

## 2022-12-18 LAB — BASIC METABOLIC PANEL
Anion gap: 6 (ref 5–15)
BUN: 36 mg/dL — ABNORMAL HIGH (ref 8–23)
CO2: 27 mmol/L (ref 22–32)
Calcium: 9 mg/dL (ref 8.9–10.3)
Chloride: 102 mmol/L (ref 98–111)
Creatinine, Ser: 1.13 mg/dL (ref 0.61–1.24)
GFR, Estimated: >60 mL/min (ref >60)
Glucose, Bld: 85 mg/dL (ref 70–99)
Potassium: 4.5 mmol/L (ref 3.5–5.1)
Sodium: 135 mmol/L (ref 135–145)

## 2022-12-18 LAB — BRAIN NATRIURETIC PEPTIDE: B Natriuretic Peptide: 33.2 pg/mL (ref 0.0–100.0)

## 2022-12-18 MED ORDER — EPLERENONE 25 MG PO TABS: 25.0000 mg | ORAL_TABLET | Freq: Every day | ORAL | 8 refills | Status: DC

## 2022-12-18 MED ORDER — DAPAGLIFLOZIN PROPANEDIOL 10 MG PO TABS: 10.0000 mg | ORAL_TABLET | Freq: Every day | ORAL | 8 refills | Status: DC

## 2022-12-18 NOTE — Progress Notes (Signed)
Paramedicine Encounter    Patient ID: [name], male    DOB: 1956/01/13, 67 y.o.   MRN: [id number]  Met with Mr. Gunkel for a first time clinic visit today where I introduced myself as his assigned community paramedic. He is agreeable to visits. He reports he stays between [facility] areas because he doesn't like to be alone but his permanent address is in [place]. He did not bring his medications today and is having trouble remembering what all he takes. He agreed to a home visit this week on Thursday so we can get them sorted out.   Allena Katz NP saw him today in clinic and made the following changes: Hold Coreg and Losartan if SBP is <110  Restart Farxiga Inspra '25mg'$   Torsemide '80mg'$  in the morning   Follow up in two weeks with Janett Billow and 3 months with [name].   I provided him with samples of Farxiga today. He reports his copay is >$400- I will see if he can be approved for grant.   I will follow up in the home later this week- he agreed with plan.   EKG and Labs obtained. No changes to plan per NP following lab results.   Visit complete.     ACTION: Home visit completed

## 2022-12-18 NOTE — Progress Notes (Signed)
ID:  [name], DOB [birth year], MRN [id number]  Provider location: [place] [facility] Type of Visit: Established patient  PCP:  [name], MD  Cardiologist:  Dr. [name]   History of Present Illness: [name] is a 67 y.o. male who has history of COPD on home oxygen, permanent atrial fibrillation, and chronic systolic CHF.  Amiodarone and DCCV were tried for atrial fibrillation in the past without success.   Patient has been noted to have a low EF since 2013.  EF improved by to normal by 2015 but dropped to 30-35% by 11/16.  Cath in 6/17 showed some CAD but not enough to cause cardiomyopathy.  He was admitted in 3/18 with PNA, atrial fibrillation/RVR, and acute on chronic systolic CHF.  Echo in 3/18 showed fall in EF to 10-15%.   I took him for right and left heart cath in 4/18, which showed nonobstructive CAD and relatively optimized filling pressures.    Syncope in 11/18. Loop recorder showed 6 second pause and periods of complete heart block. He had Medtronic BiV ICD placed in 12/18.  In 1/19, he had AV nodal ablation to promote BiV pacing.   Echo was done 6/19 with EF 25-30% with moderate RV dilation/mildly decreased systolic function. CPX 7/19 was submaximal, probably mild functional impairment.    Echo in 1/21 showed EF 35-40% with diffuse hypokinesis, moderate RV dilation with mildly decreased systolic function.   He was admitted with COVID-19 PNA in 2/19.  He had AKI/hypotension and Entresto was stopped.   Echo in 3/22 showed EF 40% with diffuse hypokinesis, mild RV enlargement, mildly decreased RV systolic function.   Presented to [facility] 7/22 with weakness and dizziness. Found to be in hemorrhagic shock from acute GIB. Hgb 5.0, INR 8.4. He was on Coumadin for atrial fibrillation. He was resuscitated with PRBCs, IVF, and given Vitamin K. His Entresto, beta blocker and torsemide were held due to hypotension. He initially required ICU monitoring due to  hypotension requiring pressors. GI team was consulted and planned to treat with oral antibiotics for suspected diverticular bleed with plans for outpatient colonoscopy.  Coumadin was transitioned to Eliquis.  He was discharged back on beta blocker, Entresto and SGLT2i. Dig, spiro and torsemide were held at discharge. Weight 259 lbs.  S/p TKR 1/23  Follow up 12/23, NYHA II-early III, volume overloaded and torsemide increased to 80 bid.   Echo 1/24 EF 40%, no significant change from prior.  Admitted 2/24 w/ RLE cellulitis, failed outpatient abx. Received IV eventually transitioned to oral cefadroxil. Followed by ID, saw Dr. Baxter Flattery 12/18/22 and arranged for CT of RLE to rule out deep tissue abscess. Finished abx course.  Today he returns for post hospital HF follow up. Overall feeling fine. He is not SOB walking on flat ground. Has RLE swelling and wears compression hose, swelling improved since hospitalization but feels it is starting to come back. Denies palpitations, CP, dizziness, abnormal bleeding or PND/Orthopnea. Chronically sleeps in recliner. Appetite ok. No fever or chills. Weight at home 280 pounds. Unclear what medications he is taking, he did not bring his medications or a list. Paramedicine here for first visit.  ECG (personally reviewed): AF, V-paced, 70 bpm  MDT device interrogation: OptiVol up on interrogation from yesterday (12/17/22).  Labs (1/19): K 4.4, creatinine 0.75, pro-BNP 433 Labs (2/19): digoxin 0.4 Labs (3/19): LDL 121, hgb 12.8, K 4.5, creatinine 0.98 Labs (6/19): LDL 48, HDL 45, K 5, creatinine 0.94, digoxin 0.3 Labs (  9/19): K 3.7, creatinine 1.09 => 1.32, digoxin 0.2 Labs (11/19): LDL 115 Labs (12/20): K 3.9, creatinine 0.87, LDL 94  Labs (3/21): K 4, creatinine 1.08 Labs (6/21): LDL 109 Labs (7/21): K 3.7, creatinine 1.15 Labs (12/21): LDL 129, TGs 239, hgb 12.9, K 3.9, creatinine 0.98, digoxin 0.8 Labs (1/22): ALT 76, AST 40, LDL 162, K 3.8 =>4.6, creatinine 1.16  => 1.12, digoxin 0.7, repeat LFTs normal Labs (5/22): LDL 72, TGs 335, K 4.2, creatinine 1.29 Labs (7/22): K 4.0, creatinine 0.77 Labs (8/22): K 4.0, creatinine 1.11 Labs (9/22): K 3.7, creatinine 1.14, hgb 11.7 Labs (3/23): LDL 28, TGs 132, LFTs normal, K 4.3, creatinine 1.0 Labs (6/23): K 4.4, creatinine 1.37 Labs (2/24): K 3.2, creatinine 1.45  PMH: 1. Asthma 2. COPD: On home oxygen. Prior smoker.  3. Atrial fibrillation: Permanent.  He failed DCCV and amiodarone in the past . 4. HTN 5. GERD 6. Hyperlipidemia: Myalgias with atorvastatin 7. BPH.  8. Chronic systolic CHF: Nonischemic cardiomyopathy.  - EF 25% by TEE in 7/13.  Normalized on 2015 echo. - Echo (11/16): EF 30-35%.  - LHC/RHC (6/17): 80% ostial stenosis small D1, 30-40% pLAD.  Mean RA 4, PA 26/10, mean PCWP 11, CI 2.33.  - Echo (3/18): EF 10-15%, diffuse hypokinesis, severe LV dilation, moderate RV dilation with severely decreased systolic function.  - LHC/RHC (4/18): 40% proximal LAD.  Mean RA 8, PA 37/10 mean 22, mean PCWP 22, LVEDP 9, CI 2.39. - Medtronic BiV ICD placement in 12/18 with AV nodal ablation to promote BiV pacing in 1/19.  - Echo (6/19): EF 25-30% with mild LV dilation, moderately dilated RV with mildly decreased systolic function, IVC not dilated, PASP 23 mmHg.  - CPX (7/19): peak VO2 17.6 (78% predicted), VE/VCO2 slope 31, RER 1.02 => submaximal, probably mild functional impairment.  - Echo (1/21): EF 35-40% with diffuse hypokinesis, moderate RV dilation with mildly decreased systolic function. - Echo (3/22): EF 40% with diffuse hypokinesis, mild RV enlargement, mildly decreased RV systolic function.  - Echo (1/24): EF 40%, RV normal, AAA 40 mm 9. Morbid obesity 10. ILR in place.  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 Complete heart block: s/p Medtronic CRT-D.  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 OSA: Untreated, unable to tolerate CPAP.  15. COVID-19 PNA 2/21.  16. Hyperlipidemia: Myalgias with multiple  statins.  17. PAD: ABIs 10/21 noncompressible on right but TBI normal, normal ABI and TBI on left.  18. GI bleeding: 7/22, diverticulosis.  19. Left TKR (1/23)  Social History   Socioeconomic History   Marital status: Divorced    Spouse name: Not on file   Number of children: 2   Years of education: Not on file   Highest education level: Not on file  Occupational History   Occupation: retired/disabled. prev worked in Therapist, sports.    Employer: DISABLED  Tobacco Use   Smoking status: Former    Packs/day: 2.00    Years: 30.00    Total pack years: 60.00    Types: Cigarettes    Quit date: 10/16/2007    Years since quitting: 15.1   Smokeless tobacco: Never  Vaping Use   Vaping Use: Never used  Substance and Sexual Activity   Alcohol use: No   Drug use: No   Sexual activity: Not Currently  Other Topics Concern   Not on file  Social History Narrative   Lives alone.   Social Determinants of Health   Financial Resource Strain: Low Risk  (06/29/2022)   Overall  Financial Resource Strain (CARDIA)    Difficulty of Paying Living Expenses: Not hard at all  Food Insecurity: No Food Insecurity (11/26/2022)   Hunger Vital Sign    Worried About Running Out of Food in the Last Year: Never true    Ran Out of Food in the Last Year: Never true  Transportation Needs: No Transportation Needs (11/26/2022)   PRAPARE - Hydrologist (Medical): No    Lack of Transportation (Non-Medical): No  Physical Activity: Inactive (06/29/2022)   Exercise Vital Sign    Days of Exercise per Week: 0 days    Minutes of Exercise per Session: 0 min  Stress: No Stress Concern Present (06/29/2022)   [place]    Feeling of Stress : Not at all  Social Connections: Moderately Integrated (06/29/2022)   Social Connection and Isolation Panel [NHANES]    Frequency of Communication with Friends and Family: More than three  times a week    Frequency of Social Gatherings with Friends and Family: More than three times a week    Attends Religious Services: More than 4 times per year    Active Member of [employer] or Organizations: Yes    Attends Music therapist: More than 4 times per year    Marital Status: Divorced  Intimate Partner Violence: Not At Risk (11/22/2022)   Humiliation, Afraid, Rape, and Kick questionnaire    Fear of Current or Ex-Partner: No    Emotionally Abused: No    Physically Abused: No    Sexually Abused: No   Family History  Problem Relation Age of Onset   COPD Mother    Asthma Mother    Colon polyps Mother    Allergies Mother    Hypothyroidism Mother    Asthma Maternal Grandmother    Colon cancer Neg Hx    ROS: All systems reviewed and negative except as per HPI.   Current Outpatient Medications  Medication Sig Dispense Refill   acetaminophen (TYLENOL) 500 MG tablet Take 500-1,000 mg by mouth every 6 (six) hours as needed for mild pain or headache.     albuterol (VENTOLIN HFA) 108 (90 Base) MCG/ACT inhaler INHALE 2 PUFFS BY MOUTH 4 TIMES A DAY AS NEEDED 54 each 3   ALPRAZolam (XANAX) 0.5 MG tablet Take 2 tablets (1 mg total) by mouth at bedtime. 60 tablet 5   Aspirin-Acetaminophen-Caffeine (GOODY HEADACHE PO) Take 1 packet by mouth every 6 (six) hours as needed (for pain).     augmented betamethasone dipropionate (DIPROLENE-AF) 0.05 % cream Apply 1 application  topically daily as needed (to affected areas- for itching).     carisoprodol (SOMA) 350 MG tablet TAKE 1 TABLET BY MOUTH THREE TIMES A DAY AS NEEDED FOR MUSCLE SPASM 90 tablet 2   carvedilol (COREG) 6.25 MG tablet TAKE 1 TABLET BY MOUTH TWICE A DAY (Patient taking differently: Take 6.25 mg by mouth 2 (two) times daily. Per patient taking 4 tabs once time a day.) 180 tablet 3   Cholecalciferol (VITAMIN D3) 1000 units CAPS Take 1,000 Units by mouth daily.     Cholecalciferol 100 MCG (4000 UT) CAPS 1 tab by mouth once  daily 30 capsule 99   doxepin (SINEQUAN) 10 MG capsule TAKE 2 CAPSULES (20 MG TOTAL) BY MOUTH AT BEDTIME AS NEEDED. (Patient taking differently: Take 20 mg by mouth at bedtime.) 180 capsule 1   DULoxetine (CYMBALTA) 60 MG capsule Take 60 mg by  mouth daily.     Dupilumab (DUPIXENT) 300 MG/2ML SOPN Inject 300 mg into the skin every 14 (fourteen) days.     ELIQUIS 5 MG TABS tablet TAKE 1 TABLET BY MOUTH TWICE A DAY (Patient taking differently: Take 5 mg by mouth in the morning.) 60 tablet 11   eplerenone (INSPRA) 50 MG tablet Take 1 tablet (50 mg total) by mouth daily. 90 tablet 3   Evolocumab (REPATHA SURECLICK) [id number] MG/ML SOAJ INJECT 1 PEN INTO THE SKIN EVERY 14 (FOURTEEN) DAYS. (Patient taking differently: Inject 140 mg into the skin every 14 (fourteen) days.) 6 mL 3   fluticasone (FLONASE) 50 MCG/ACT nasal spray PLACE 2 SPRAYS INTO BOTH NOSTRILS DAILY AS NEEDED FOR ALLERGIES OR RHINITIS. 48 mL 1   Fluticasone-Umeclidin-Vilant (TRELEGY ELLIPTA) 100-62.5-25 MCG/ACT AEPB Inhale 1 puff into the lungs daily. 180 each 2   hydrOXYzine (ATARAX/VISTARIL) 10 MG tablet TAKE 1 TABLET BY MOUTH 3 TIMES DAILY AS NEEDED FOR ITCHING. (Patient taking differently: Take 5 mg by mouth 3 (three) times daily as needed for itching.) 60 tablet 2   ipratropium-albuterol (DUONEB) 0.5-2.5 (3) MG/3ML SOLN TAKE 3 ML BY NEBULIZATION EVERY 4 HOURS AS NEEDED (WHEEZING). (Patient taking differently: Take 3 mLs by nebulization every 4 (four) hours as needed (for wheezing or COPD symptoms).) 360 mL 0   ketoconazole (NIZORAL) 2 % cream Apply 1 Application topically daily. (Patient taking differently: Apply 1 Application topically daily as needed for irritation.) 120 g 1   KLOR-CON M20 20 MEQ tablet TAKE 1 TABLET BY MOUTH EVERY DAY (Patient taking differently: Take 40 mEq by mouth daily.) 90 tablet 3   losartan (COZAAR) 25 MG tablet Take 1 tablet (25 mg total) by mouth daily. 30 tablet 11   mupirocin ointment (BACTROBAN) 2 % Place 1  Application into the nose 2 (two) times daily. 22 g 0   omeprazole (PRILOSEC) 20 MG capsule TAKE 1 CAPSULE BY MOUTH TWICE A DAY (Patient taking differently: Take 20 mg by mouth See admin instructions. Take 20 mg by mouth in the morning before breakfast and an additional 20 mg later in the day, if no relief from reflux) 180 capsule 1   ondansetron (ZOFRAN) 4 MG tablet Take 1 tablet (4 mg total) by mouth every 6 (six) hours as needed for nausea. 20 tablet 0   predniSONE (DELTASONE) 10 MG tablet Take 1 tablet (10 mg total) by mouth daily with breakfast. 30 tablet 0   Respiratory Therapy Supplies (FULL KIT NEBULIZER SET) MISC 1 each by Does not apply route as needed. 1 each 0   senna-docusate (SENOKOT-S) 8.6-50 MG tablet Take 1 tablet by mouth at bedtime as needed for mild constipation. 30 tablet 0   silver sulfADIAZINE (SILVADENE) 1 % cream APPLY TOPICALLY TO AFFECTED AREA EVERY DAY (Patient taking differently: Apply 1 Application topically daily as needed (to wound sites that won't heal).) 50 g 3   tamsulosin (FLOMAX) 0.4 MG CAPS capsule TAKE 1 CAPSULE BY MOUTH EVERY DAY (Patient taking differently: Take 0.4 mg by mouth every evening.) 90 capsule 3   torsemide (DEMADEX) 20 MG tablet Take 4 tablets (80 mg total) by mouth See admin instructions. Take 80 mg by mouth in the morning and evening; HOLD For a few days and resume next week with close follow up with Dr. [name] (Patient taking differently: Take 80 mg by mouth See admin instructions. Take 80 mg by mouth in the morning and evening; HOLD For a few days and resume next week with  close follow up with Dr. [name]..No results found for: ....... 12/18/22: pt only taking '40mg'$  BID per statement.)     traMADol (ULTRAM) 50 MG tablet TAKE 1 TABLET BY MOUTH EVERY DAY AS NEEDED (Patient taking differently: Take 25 mg by mouth daily as needed for moderate pain.) 30 tablet 2   traZODone (DESYREL) 100 MG tablet TAKE 2 TABLETS BY MOUTH AT BEDTIME AS NEEDED (Patient taking  differently: Take 200 mg by mouth at bedtime.) 180 tablet 1   TUMS 500 MG chewable tablet Chew 500-2,000 mg by mouth every 4 (four) hours as needed for indigestion or heartburn.     venlafaxine XR (EFFEXOR XR) 150 MG 24 hr capsule Take 1 capsule (150 mg total) by mouth daily with breakfast. 90 capsule 3   zolpidem (AMBIEN) 10 MG tablet TAKE 1 TABLET BY MOUTH EVERY DAY AT BEDTIME AS NEEDED 90 tablet 1   dapagliflozin propanediol (FARXIGA) 10 MG TABS tablet Take 1 tablet (10 mg total) by mouth daily before breakfast. (Patient not taking: Reported on 12/18/2022) 90 tablet 3   No current facility-administered medications for this encounter.   Wt Readings from Last 3 Encounters:  12/18/22 128.4 kg (283 lb)  12/18/22 127.1 kg (280 lb 3.2 oz)  12/03/22 127.4 kg (280 lb 12.8 oz)   BP 104/64   Pulse 71   Wt 128.4 kg (283 lb)   SpO2 94%   BMI 40.61 kg/m  Physical Exam General:  NAD. No resp difficulty, walked into clinic with cane HEENT: Normal Neck: Supple. No JVD, thick neck. Carotids 2+ bilat; no bruits. No lymphadenopathy or thryomegaly appreciated. Cor: PMI nondisplaced. Regular rate & rhythm. No rubs, gallops or murmurs. Lungs: Clear Abdomen: Soft, nontender, nondistended. No hepatosplenomegaly. No bruits or masses. Good bowel sounds. Extremities: No cyanosis, clubbing, rash,1+ BLE pre-tibial edema, zip compression hose on Neuro: Alert & oriented x 3, cranial nerves grossly intact. Moves all 4 extremities w/o difficulty. Affect pleasant.  Assessment/Plan:  1. Atrial fibrillation: Permanent.  Now s/p AV nodal ablation to allow effective BiV pacing.  - Continue Eliquis. No bleeding issues. CBC today.   2. COPD: No longer smoking.  Not using oxygen.  3. Chronic systolic CHF: Nonischemic cardiomyopathy based on 6/17 cath. However, he had a recent significant fall in EF from 30-35% to 10-15% on echo from 3/18.  LHC/RHC in 4/18 showed nonobstructive CAD and relatively optimized filling  pressures.  He now has Medtronic CRT-D device with AV nodal ablation to allow BiV pacing.  Echo 6/19 with EF 25-30% with mildly decreased RV systolic function. CPX 7/19 was submaximal but likely only mild functional limitation from CHF.  Echo in 1/21 showed EF 35-40%, echo in 3/22 showed EF up to 40% with mildly decreased RV systolic function.  NYHA class II-early III, functional class confounded by body habitus and physical deconditioning. He is volume overloaded on exam and by OptiVol, low BP limits GDMT. - Hold Coreg and losartan today. - Restart Farxiga 10 mg daily. Continue 20 KCL daily. - Restart torsemide 80 mg daily. BMET and BNP today. - Restart inspra 25 mg daily (lower dose). - Tomorrow, if SBP > 110, ok to restart Coreg 6.25 bid and losartan 25 mg daily. 4. CAD: Nonobstructive on 4/18 cath.  No exertional chest pain.  - No ASA with Eliquis use. - Statin myalgias, continue Repatha. Good LDL in 3/23.  5. Complete heart block: Now has Medtronic CRT-D device and s/p AV nodal ablation.  6. OSA: Unable to tolerate CPAP.  7. Elevated LFTs: Most recent LFTs back to normal.   8. Lower extremity wound/recent cellulitis: Suspect venous stasis and volume overload contributory. He had recent admission for cellulitis and completed IV abx. Followed by ID, Dr. Baxter Flattery - ID now following 9. Medication management: Nira Conn with paramedicine now following.   Follow up in 2 weeks with APP for med and fluid check.  Signed, Rafael Bihari, FNP  12/18/2022  [facility] [address] [facility] [place] [address] [phone] (office) [phone] (fax)

## 2022-12-18 NOTE — Patient Instructions (Signed)
Thank you for coming in today  Labs were done today, if any labs are abnormal the clinic will call you No news is good news  Medications: Restart Farxiga 10 mg 1 tablet daily   Follow up appointments:  Your physician recommends that you schedule a follow-up appointment in:  2 weeks in clinic  3 months with Dr. [name]    Do the following things EVERYDAY: Weigh yourself in the morning before breakfast. Write it down and keep it in a log. Take your medicines as prescribed Eat low salt foods--Limit salt (sodium) to 2000 mg per day.  Stay as active as you can everyday Limit all fluids for the day to less than 2 liters   At the [facility], you and your health needs are our priority. As part of our continuing mission to provide you with exceptional heart care, we have created designated Provider Care Teams. These Care Teams include your primary Cardiologist (physician) and Advanced Practice Providers (APPs- Physician Assistants and Nurse Practitioners) who all work together to provide you with the care you need, when you need it.   You may see any of the following providers on your designated Care Team at your next follow up: Dr Glori Bickers Dr Loralie Champagne Dr. Roxana Hires, NP Lyda Jester, [place] [facility] [place], [place] Forestine Na, NP Audry Riles, PharmD   Please be sure to bring in all your medications bottles to every appointment.    Thank you for choosing [facility]  If you have any questions or concerns before your next appointment please send [place] a message through [place] or call our office at [phone].    TO LEAVE A MESSAGE FOR THE NURSE SELECT OPTION 2, PLEASE LEAVE A MESSAGE INCLUDING: YOUR NAME DATE OF BIRTH CALL BACK NUMBER REASON FOR CALL**this is important as we prioritize the call backs  YOU WILL RECEIVE A CALL BACK THE SAME DAY AS LONG AS YOU CALL BEFORE 4:00  PM

## 2022-12-18 NOTE — Progress Notes (Signed)
RFV: follow up for cellulitis hospitalization Patient ID: [name], male   DOB: 1956-06-27, 67 y.o.   MRN: [id number]  HPI Finished with oral abtx but concern for discoloration, flaking skin and unilateral right leg lower, swelling  Outpatient Encounter Medications as of 12/18/2022  Medication Sig   acetaminophen (TYLENOL) 500 MG tablet Take 500-1,000 mg by mouth every 6 (six) hours as needed for mild pain or headache.   albuterol (VENTOLIN HFA) 108 (90 Base) MCG/ACT inhaler INHALE 2 PUFFS BY MOUTH 4 TIMES A DAY AS NEEDED (Patient taking differently: Inhale 2 puffs into the lungs 4 (four) times daily as needed for wheezing or shortness of breath (or COPD exacerbation).)   ALPRAZolam (XANAX) 0.5 MG tablet Take 2 tablets (1 mg total) by mouth at bedtime.   Aspirin-Acetaminophen-Caffeine (GOODY HEADACHE PO) Take 1 packet by mouth every 6 (six) hours as needed (for pain).   augmented betamethasone dipropionate (DIPROLENE-AF) 0.05 % cream Apply 1 application  topically daily as needed (to affected areas- for itching).   carisoprodol (SOMA) 350 MG tablet TAKE 1 TABLET BY MOUTH THREE TIMES A DAY AS NEEDED FOR MUSCLE SPASM (Patient taking differently: Take 350 mg by mouth in the morning, at noon, and at bedtime.)   carvedilol (COREG) 6.25 MG tablet TAKE 1 TABLET BY MOUTH TWICE A DAY   Cholecalciferol (VITAMIN D3) 1000 units CAPS Take 1,000 Units by mouth daily.   Cholecalciferol 100 MCG (4000 UT) CAPS 1 tab by mouth once daily   dapagliflozin propanediol (FARXIGA) 10 MG TABS tablet Take 1 tablet (10 mg total) by mouth daily before breakfast.   doxepin (SINEQUAN) 10 MG capsule TAKE 2 CAPSULES (20 MG TOTAL) BY MOUTH AT BEDTIME AS NEEDED. (Patient taking differently: Take 20 mg by mouth at bedtime.)   doxycycline (VIBRA-TABS) 100 MG tablet Take 1 tablet (100 mg total) by mouth 2 (two) times daily.   DULoxetine (CYMBALTA) 60 MG capsule Take 60 mg by mouth daily.   Dupilumab (DUPIXENT) 300 MG/2ML  SOPN Inject 300 mg into the skin every 14 (fourteen) days.   ELIQUIS 5 MG TABS tablet TAKE 1 TABLET BY MOUTH TWICE A DAY (Patient taking differently: Take 5 mg by mouth in the morning.)   eplerenone (INSPRA) 50 MG tablet Take 1 tablet (50 mg total) by mouth daily.   Evolocumab (REPATHA SURECLICK) 140 MG/ML SOAJ INJECT 1 PEN INTO THE SKIN EVERY 14 (FOURTEEN) DAYS. (Patient taking differently: Inject 140 mg into the skin every 14 (fourteen) days.)   fluticasone (FLONASE) 50 MCG/ACT nasal spray PLACE 2 SPRAYS INTO BOTH NOSTRILS DAILY AS NEEDED FOR ALLERGIES OR RHINITIS.   Fluticasone-Umeclidin-Vilant (TRELEGY ELLIPTA) 100-62.5-25 MCG/ACT AEPB Inhale 1 puff into the lungs daily.   hydrOXYzine (ATARAX/VISTARIL) 10 MG tablet TAKE 1 TABLET BY MOUTH 3 TIMES DAILY AS NEEDED FOR ITCHING. (Patient taking differently: Take 5 mg by mouth 3 (three) times daily as needed for itching.)   ipratropium-albuterol (DUONEB) 0.5-2.5 (3) MG/3ML SOLN TAKE 3 ML BY NEBULIZATION EVERY 4 HOURS AS NEEDED (WHEEZING). (Patient taking differently: Take 3 mLs by nebulization every 4 (four) hours as needed (for wheezing or COPD symptoms).)   ketoconazole (NIZORAL) 2 % cream Apply 1 Application topically daily. (Patient taking differently: Apply 1 Application topically daily as needed for irritation.)   KLOR-CON M20 20 MEQ tablet TAKE 1 TABLET BY MOUTH EVERY DAY (Patient taking differently: Take 40 mEq by mouth daily.)   losartan (COZAAR) 25 MG tablet Take 1 tablet (25 mg total) by mouth  daily. (Patient taking differently: Take 12.5 mg by mouth every evening.)   mupirocin ointment (BACTROBAN) 2 % Place 1 Application into the nose 2 (two) times daily.   omeprazole (PRILOSEC) 20 MG capsule TAKE 1 CAPSULE BY MOUTH TWICE A DAY (Patient taking differently: Take 20 mg by mouth See admin instructions. Take 20 mg by mouth in the morning before breakfast and an additional 20 mg later in the day, if no relief from reflux)   ondansetron (ZOFRAN) 4  MG tablet Take 1 tablet (4 mg total) by mouth every 6 (six) hours as needed for nausea.   predniSONE (DELTASONE) 10 MG tablet Take 1 tablet (10 mg total) by mouth daily with breakfast.   Respiratory Therapy Supplies (FULL KIT NEBULIZER SET) MISC 1 each by Does not apply route as needed.   senna-docusate (SENOKOT-S) 8.6-50 MG tablet Take 1 tablet by mouth at bedtime as needed for mild constipation.   silver sulfADIAZINE (SILVADENE) 1 % cream APPLY TOPICALLY TO AFFECTED AREA EVERY DAY (Patient taking differently: Apply 1 Application topically daily as needed (to wound sites that won't heal).)   tamsulosin (FLOMAX) 0.4 MG CAPS capsule TAKE 1 CAPSULE BY MOUTH EVERY DAY (Patient taking differently: Take 0.4 mg by mouth every evening.)   torsemide (DEMADEX) 20 MG tablet Take 4 tablets (80 mg total) by mouth See admin instructions. Take 80 mg by mouth in the morning and evening; HOLD For a few days and resume next week with close follow up with Dr. Shirlee Latch   traMADol (ULTRAM) 50 MG tablet TAKE 1 TABLET BY MOUTH EVERY DAY AS NEEDED (Patient taking differently: Take 25 mg by mouth daily as needed for moderate pain.)   traZODone (DESYREL) 100 MG tablet TAKE 2 TABLETS BY MOUTH AT BEDTIME AS NEEDED (Patient taking differently: Take 200 mg by mouth at bedtime.)   TUMS 500 MG chewable tablet Chew 500-2,000 mg by mouth every 4 (four) hours as needed for indigestion or heartburn.   venlafaxine XR (EFFEXOR XR) 150 MG 24 hr capsule Take 1 capsule (150 mg total) by mouth daily with breakfast. (Patient taking differently: Take 150 mg by mouth daily.)   zolpidem (AMBIEN) 10 MG tablet TAKE 1 TABLET BY MOUTH EVERY DAY AT BEDTIME AS NEEDED (Patient taking differently: Take 10 mg by mouth at bedtime.)   No facility-administered encounter medications on file as of 12/18/2022.     Patient Active Problem List   Diagnosis Date Noted   Cellulitis and abscess of leg, except foot 11/21/2022   Cellulitis of right lower extremity  without foot 11/20/2022   Cellulitis of right leg 11/14/2022   Anxiety and depression 05/20/2022   Diabetic leg ulcer (HCC) 05/20/2022   AC (acromioclavicular) arthritis 04/24/2022   Cataract, nuclear sclerotic, left eye 03/07/2022   ED (erectile dysfunction) 01/03/2022   Intertrigo 12/24/2021   Gluteal tendinitis of left buttock 12/19/2021   Unilateral primary osteoarthritis, left knee 10/17/2021   Status post total knee replacement, left 10/17/2021   Rib pain on right side 10/10/2021   History of colonic polyps 09/26/2021   Acute cough 07/21/2021   Bursitis of left hip 06/13/2021   Tooth fracture 06/11/2021   Pain and swelling of right lower leg 05/18/2021   Hemorrhagic shock (HCC) 05/05/2021   Gastrointestinal hemorrhage    Diverticulitis of colon    Preop exam for internal medicine 03/04/2021   B12 deficiency 03/04/2021   Vitamin D deficiency 03/01/2021   Myalgia 12/01/2020   Laceration of left leg 09/17/2020  Statin myopathy 08/02/2020   Rotator cuff arthropathy of both shoulders 02/23/2020   (HFpEF) heart failure with preserved ejection fraction (HCC) 02/22/2020   ICD (implantable cardioverter-defibrillator) in place 12/27/2019   COVID-19 virus infection 12/09/2019   COVID-19 12/09/2019   Hypotension 12/09/2019   Upper respiratory infection 11/27/2019   COPD exacerbation (HCC) 11/27/2019   Elevated PSA 09/23/2019   Lip lesion 12/23/2018   Diabetic ulcer of left lower leg with necrosis of muscle (HCC) 12/05/2018   Chronic bursitis of left shoulder 07/01/2018   Pain due to total knee replacement (HCC) 03/31/2018   Rotator cuff arthropathy, left 02/13/2018   Right rotator cuff tear arthropathy 01/14/2018   Increased prostate specific antigen (PSA) velocity 09/26/2017   HFrEF (heart failure with reduced ejection fraction) (HCC) 09/18/2017   Trigger point of thoracic region 07/04/2017   Right lumbar radiculopathy 05/15/2017   Right leg swelling 05/09/2017   Glaucoma  04/18/2017   History of nasal polyposis 02/19/2017   Chronic rhinitis 02/19/2017   Microhematuria 12/04/2016   Hyperlipidemia 08/15/2016   Encounter for well adult exam with abnormal findings 11/29/2015   Persistent atrial fibrillation (HCC) 03/30/2015   Syncope 03/30/2015   Acute on chronic diastolic heart failure (HCC) 03/30/2015   Former tobacco use 02/25/2015   Type 2 diabetes mellitus (HCC) 02/25/2015   Permanent atrial fibrillation (HCC) 02/25/2015   Anemia 02/23/2015   Thyrotoxicosis 02/23/2015   Hx of adenomatous colonic polyps 10/21/2014   De Quervain's tenosynovitis, right 04/30/2014   Osteoarthritis of right knee 02/19/2014   Encounter for therapeutic drug monitoring 11/17/2013   Thoracic degenerative disc disease 06/17/2013   Thoracic spondylosis without myelopathy 02/11/2013   Lumbosacral spondylosis without myelopathy 02/11/2013   Degenerative joint disease of knee, left 02/11/2013   COPD (chronic obstructive pulmonary disease) (HCC) 08/20/2012   Bundle branch block, right and extreme left anterior fascicular 05/05/2012   Nonischemic cardiomyopathy (HCC) 05/02/2012   Other primary cardiomyopathies 05/02/2012   Anticoagulated on warfarin 03/21/2012   Spongiotic dermatitis 09/26/2011   Past myocardial infarction 04/19/2011   UNSPECIFIED URETHRAL STRICTURE 12/26/2010   Chronic pain syndrome 05/18/2010   Obstructive sleep apnea 10/21/2007   NEPHROLITHIASIS, HX OF 10/21/2007   Anxiety 06/09/2007   GERD (gastroesophageal reflux disease) 06/09/2007   Benign prostatic hyperplasia 06/09/2007   Morbid obesity (HCC) 06/03/2007   Depression 06/03/2007   Essential hypertension 06/03/2007   INSOMNIA 06/03/2007     Health Maintenance Due  Topic Date Due   COLONOSCOPY (Pts 45-73yrs Insurance coverage will need to be confirmed)  03/03/2020   OPHTHALMOLOGY EXAM  02/04/2021   HEMOGLOBIN A1C  06/22/2022   FOOT EXAM  09/21/2022   Diabetic kidney evaluation - Urine ACR   12/21/2022     Review of Systems Review of Systems  Constitutional: Negative for fever, chills, diaphoresis, activity change, appetite change, fatigue and unexpected weight change.  HENT: Negative for congestion, sore throat, rhinorrhea, sneezing, trouble swallowing and sinus pressure.  Eyes: Negative for photophobia and visual disturbance.  Respiratory: Negative for cough, chest tightness, shortness of breath, wheezing and stridor.  Cardiovascular: Negative for chest pain, palpitations and leg swelling.  Gastrointestinal: Negative for nausea, vomiting, abdominal pain, diarrhea, constipation, blood in stool, abdominal distention and anal bleeding.  Genitourinary: Negative for dysuria, hematuria, flank pain and difficulty urinating.  Musculoskeletal: Negative for myalgias, back pain, joint swelling, arthralgias and gait problem.  Skin: Negative for color change, pallor, rash and wound.  Neurological: Negative for dizziness, tremors, weakness and light-headedness.  Hematological: Negative for  adenopathy. Does not bruise/bleed easily.  Psychiatric/Behavioral: Negative for behavioral problems, confusion, sleep disturbance, dysphoric mood, decreased concentration and agitation.   Physical Exam   BP 101/65   Pulse 74   Temp (!) 97.2 F (36.2 C) (Oral)   Resp 16   Wt 280 lb 3.2 oz (127.1 kg)   SpO2 96%   BMI 40.20 kg/m    See attached pic  CBC Lab Results  Component Value Date   WBC 10.1 11/24/2022   RBC 4.10 (L) 11/24/2022   HGB 13.2 11/24/2022   HCT 41.7 11/24/2022   PLT 285 11/24/2022   MCV 101.7 (H) 11/24/2022   MCH 32.2 11/24/2022   MCHC 31.7 11/24/2022   RDW 12.6 11/24/2022   LYMPHSABS 1.6 11/24/2022   MONOABS 1.0 11/24/2022   EOSABS 0.1 11/24/2022    BMET Lab Results  Component Value Date   NA 135 11/24/2022   K 3.2 (L) 11/24/2022   CL 93 (L) 11/24/2022   CO2 29 11/24/2022   GLUCOSE 112 (H) 11/24/2022   BUN 45 (H) 11/24/2022   CREATININE 1.45 (H) 11/24/2022    CALCIUM 8.6 (L) 11/24/2022   GFRNONAA 53 (L) 11/24/2022   GFRAA >60 07/14/2020      Assessment and Plan  Will get ct of right leg to see if any signs of deep tissue abscess that may not have been identified during hospitalization. DVT has been ruled out. Suspect discoloration is sequelae from cellulitis

## 2022-12-18 NOTE — Patient Instructions (Signed)
We have ordered a CT scan to be done.  Plan for follow up pending CT results as needed.   Have a great day!

## 2022-12-18 NOTE — Progress Notes (Signed)
Medication Samples have been provided to the patient.  Drug name: farxiga       Strength: '10mg'$         Qty: 28  [id number]  Exp.Date: 06/14/2025  Dosing instructions: ONE TAB DAILY   The patient has been instructed regarding the correct time, dose, and frequency of taking this medication, including desired effects and most common side effects.   Kerry Dory 4:22 PM 12/18/2022

## 2022-12-18 NOTE — Progress Notes (Signed)
EPIC Encounter for ICM Monitoring  Patient Name: [name] is a 67 y.o. male Date: 12/18/2022 Primary Care Physican: [name], MD Primary Cardiologist: [name] Electrophysiologist: Vergie Living Pacing: 99.1%    10/02/2022 Weight: 265 lbs     Patient being seen at HF clinic today, 3/5.   Hospitalized 2/6-2/10 for cellulitis.  Metolazone stopped at 2/10 discharge   Optivol thoracic impedance suggesting possible fluid accumulation starting 2/23.   Prescribed:  Torsemide 20 mg 4 tablet(s) (80 mg total) by mouth twice a day.   Potassium 20 mEq take 1 tablet (20 mEq total) daily   Labs: 11/24/2022 Creatinine 1.45, BUN 45, Potassium 3.2, Sodium 135, GFR 53 (12:09 PM) 11/24/2022 Creatinine 1.59, BUN 51, Potassium 4.0, Sodium 138, GFR 47 (6:01 AM) 11/23/2022 Creatinine 1.27, BUN 46, Potassium 3.8, Sodium 136, GFR >60  11/22/2022 Creatinine 1.21, BUN 37, Potassium 3.4, Sodium 136, GFR >60 11/21/2022 Creatinine 0.95, BUN 24, Potassium 4.2, Sodium 136, GFR >60  11/20/2022 Creatinine 1.07, BUN 26, Potassium 4.1, Sodium 134, GFR >60  10/24/2022 Creatinine 1.22, BUN 26, Potassium 3.7, Sodium 140, GFR >60 10/05/2022 Creatinine 1.28, BUN 25, Potassium 3.9, Sodium 138, GFR >60 03/30/2022 Creatinine 1.37, BUN 29, Potassium 44, Sodium 140 03/20/2022 Creatinine 1.51, BUN 36, Potassium 4.5, Sodium 135  12/20/2021 Creatinine 1.00, BUN 27, Potassium 4.3, Sodium 140, GFR 78.15 12/15/2021 Creatinine 1.05, BUN 23, Potassium 4.5, Sodium 137, GFR >60 A complete set of results can be found in Results Review.   Recommendations:  Any recommendations will given at HF clinic visit 3/5.     Follow-up plan: ICM clinic phone appointment on 12/24/2022 to recheck fluid levels.   91 day device clinic remote transmission 01/23/2023.   EP/Cardiology Office Visits:    Recall 11/28/2023 with Dr [name].   12/17/2021 with HF clinic APP.   Copy of ICM check sent to Dr. [name].    3 month ICM trend: 12/17/2022.    12-14  Month ICM trend:     [name], RN 12/18/2022 10:10 AM

## 2022-12-19 ENCOUNTER — Telehealth: Payer: Self-pay

## 2022-12-19 NOTE — Telephone Encounter (Signed)
Received form from [facility] for clinical questions related to CT order. Completed form and faxed back.  Beryle Flock, RN

## 2022-12-20 ENCOUNTER — Ambulatory Visit (HOSPITAL_COMMUNITY): Payer: Medicare HMO

## 2022-12-20 ENCOUNTER — Telehealth (HOSPITAL_COMMUNITY): Payer: Self-pay

## 2022-12-20 NOTE — Telephone Encounter (Signed)
Mr. Walton left me a message this morning reporting he needed to cancel our home visit today because he had a fall this morning and had EMS come check him out and he elected not to go to the hospital but did not want any visits today and he would reach out to reschedule. I attempted to call and text Mr. Shirah without an answer or call back today. I will continue to attempt to reach out for home paramedicine visit.   [name], [place] 12/20/2022

## 2022-12-24 ENCOUNTER — Ambulatory Visit: Payer: Medicare HMO | Attending: Internal Medicine

## 2022-12-24 ENCOUNTER — Telehealth: Payer: Self-pay

## 2022-12-24 DIAGNOSIS — I5022 Chronic systolic (congestive) heart failure: Secondary | ICD-10-CM

## 2022-12-24 DIAGNOSIS — Z95810 Presence of automatic (implantable) cardiac defibrillator: Secondary | ICD-10-CM

## 2022-12-24 NOTE — Progress Notes (Signed)
EPIC Encounter for ICM Monitoring  Patient Name: [name] is a 67 y.o. male Date: 12/24/2022 Primary Care Physican: [name], MD Primary Cardiologist: [name] Electrophysiologist: Vergie Living Pacing: 99.1%    10/02/2022 Weight: 265 lbs     Spoke with patient and heart failure questions reviewed.  Transmission results reviewed.  Pt asymptomatic for fluid accumulation.  Reports feeling well at this time and voices no complaints.     Optivol thoracic impedance suggesting fluid levels returned to normal.   Prescribed:  Torsemide 20 mg 4 tablet(s) (80 mg total) by mouth twice a day.   Potassium 20 mEq take 1 tablet (20 mEq total) daily   Labs: 11/24/2022 Creatinine 1.45, BUN 45, Potassium 3.2, Sodium 135, GFR 53 (12:09 PM) 11/24/2022 Creatinine 1.59, BUN 51, Potassium 4.0, Sodium 138, GFR 47 (6:01 AM) 11/23/2022 Creatinine 1.27, BUN 46, Potassium 3.8, Sodium 136, GFR >60  11/22/2022 Creatinine 1.21, BUN 37, Potassium 3.4, Sodium 136, GFR >60 11/21/2022 Creatinine 0.95, BUN 24, Potassium 4.2, Sodium 136, GFR >60  11/20/2022 Creatinine 1.07, BUN 26, Potassium 4.1, Sodium 134, GFR >60  A complete set of results can be found in Results Review.   Recommendations: No changes and encouraged to call if experiencing any fluid symptoms.    Follow-up plan: ICM clinic phone appointment on 01/21/2023.   91 day device clinic remote transmission 01/23/2023.   EP/Cardiology Office Visits:    Recall 11/28/2023 with Dr [name].   01/07/2022 with HF clinic APP.   Copy of ICM check sent to Dr. [name].    3 month ICM trend: 12/24/2022.    12-14 Month ICM trend:     [name], RN 12/24/2022 2:18 PM

## 2022-12-24 NOTE — Telephone Encounter (Signed)
Attempted ICM Call to patient.  Left message requesting to send manual remote transmission from home monitor.  Left message if assistance is needed to call back with ICM phone number.

## 2022-12-25 ENCOUNTER — Ambulatory Visit: Payer: Medicare HMO | Admitting: Podiatry

## 2022-12-25 ENCOUNTER — Telehealth (HOSPITAL_COMMUNITY): Payer: Self-pay

## 2022-12-25 DIAGNOSIS — M79674 Pain in right toe(s): Secondary | ICD-10-CM | POA: Diagnosis not present

## 2022-12-25 DIAGNOSIS — E119 Type 2 diabetes mellitus without complications: Secondary | ICD-10-CM

## 2022-12-25 DIAGNOSIS — M79675 Pain in left toe(s): Secondary | ICD-10-CM | POA: Diagnosis not present

## 2022-12-25 DIAGNOSIS — B351 Tinea unguium: Secondary | ICD-10-CM | POA: Diagnosis not present

## 2022-12-25 DIAGNOSIS — L84 Corns and callosities: Secondary | ICD-10-CM

## 2022-12-25 MED ORDER — AMMONIUM LACTATE 12 % EX CREA: 1.0000 | TOPICAL_CREAM | CUTANEOUS | 3 refills | Status: DC | PRN

## 2022-12-25 NOTE — Progress Notes (Signed)
  Subjective:  Patient ID: [name], male    DOB: 05-22-1956,  MRN: [id number]  Chief Complaint  Patient presents with   Nail Problem    Thick painful toenails, 3 month follow up     67 y.o. male returns with the above complaint. History confirmed with patient.  His nails are thickened elongated and causing pain again.  Calluses thickened some. The skin on both legs has been peeling greatly due to his recent bout of cellulitis  Objective:  Physical Exam: warm, good capillary refill, no trophic changes or ulcerative lesions, normal sensory exam, +2 DP and PT reduced bilateral and venous stasis dermatitis noted.  Moderate to severe hallux abductovalgus with hammertoe contractures 2 through 5 bilaterally, right worse than left.  Thickened elongated dystrophic mycotic nails x10 with pincer nail deformity.    Callus medial 1st MTPJ bilaterally. Xerosis cutis noted  Summary:  Right: Resting right ankle-brachial index indicates noncompressible right  lower extremity arteries. The right toe-brachial index is normal. ABIs are  unreliable.   Left: Resting left ankle-brachial index is within normal range. No  evidence of significant left lower extremity arterial disease. The left  toe-brachial index is normal.       *See table(s) above for measurements and observations.         Radiographs: X-ray of the right foot: Moderate hallux abductovalgus with the presence of metatarsus adductus deformity and pes planus, no increased change in alignment since last radiographs Assessment:   1. Pain due to onychomycosis of toenails of both feet   2. Callus of foot   3. Type 2 diabetes mellitus without complication, without long-term current use of insulin ([place])        Plan:  Patient was evaluated and treated and all questions answered.  Discussed the etiology and treatment options for the condition in detail with the patient. Educated patient on the topical and oral treatment options for  mycotic nails. Recommended debridement of the nails today. Sharp and mechanical debridement performed of all painful and mycotic nails today. Nails debrided in length and thickness using a nail nipper to level of comfort. Discussed treatment options including appropriate shoe gear. Follow up as needed for painful nails.  Ingrowing corners of hallux nails debrided and slant back fashion  All symptomatic hyperkeratoses were safely debrided with a sterile #15 blade to patient's level of comfort without incident. We discussed preventative and palliative care of these lesions including supportive and accommodative shoegear, padding, prefabricated and custom molded accommodative orthoses, use of a pumice stone and lotions/creams daily.  Ammonium lactate cream rx sent to pharmacy  for dry skin   Return in about 10 weeks (around 03/05/2023) for at risk diabetic foot care.

## 2022-12-25 NOTE — Telephone Encounter (Signed)
Attempted to reach [name] for paramedicine home visit- no answer on call, message left with no return call by 1700. I will attempt again later this week.  [name], [place] 12/25/2022

## 2022-12-26 ENCOUNTER — Other Ambulatory Visit: Payer: Self-pay | Admitting: Family Medicine

## 2022-12-26 ENCOUNTER — Telehealth (HOSPITAL_COMMUNITY): Payer: Self-pay

## 2022-12-26 ENCOUNTER — Telehealth (HOSPITAL_COMMUNITY): Payer: Self-pay | Admitting: Emergency Medicine

## 2022-12-26 NOTE — Telephone Encounter (Signed)
Made initial phone call to Mr. Einbinder and scheduled home visit for tomorrow 11/14 @ 1:00.    Renee Ramus, [place] 12/26/2022

## 2022-12-26 NOTE — Telephone Encounter (Signed)
[name] returned my call today in reference to Paramedicine Visit. He advised he is living at the [facility] address more often and agreed to have paramedic Dede Tamala Julian to come out due to this area being in her [place]. Dede is aware and will be setting up home paramedicine visit with him directly. Call complete.   [name], [place] 12/26/2022

## 2022-12-27 ENCOUNTER — Other Ambulatory Visit (HOSPITAL_COMMUNITY): Payer: Self-pay | Admitting: Emergency Medicine

## 2022-12-27 ENCOUNTER — Ambulatory Visit (HOSPITAL_COMMUNITY): Payer: Medicare HMO

## 2022-12-27 NOTE — Progress Notes (Signed)
Paramedicine Encounter    Patient ID: [name], male    DOB: 12-26-1955, 67 y.o.   MRN: [id number]   Complaints NONE  Assessment No chest pain or SOB.  Lung sounds clear and equal bilat   Compliance with meds Pt. States he is compliant  Pill box filled takes meds out of bottles.    Refills needed none  Meds changes since last visit none     Social changes Living in another location for a while   BP 108/80 (BP Location: Left Arm, Patient Position: Sitting, Cuff Size: Normal)   Pulse 72   Resp 16   SpO2 97%  Weight yesterday-not taken Last visit weight-283lb   Today was my first paramedicine visit w/ [name].  He was very pleasant and reports to be feeling well except for dealing with his seasonal allergies.  [name] has been in the paramedicine program for quite some time.  He appears to self sufficient.  He takes his medications from bottles and says he doesn't like the pill box as he has tried it before.  He keeps his morning and evening meds in separate bags.  He says he likes to be really involved in all the aspects of his health.  He is familiar with all his meds and hx.   All medications were reviewed with doses. Reviewed upcoming appointments and he is familiar and prepared for same.   Next home visit scheduled 3/21 @ 2:00.  ACTION: Home visit completed  Skipper Cliche [id number] 12/27/22  Patient Care Team: [name], MD as PCP - General (Internal Medicine) Larey Dresser, MD as PCP - Advanced Heart Failure (Cardiology) Deboraha Sprang, MD as PCP - Electrophysiology (Cardiology) Himmelrich, Bryson Ha, RD (Inactive) as Dietitian Deboraha Sprang, MD as Consulting Physician (Cardiology) Mcarthur Rossetti, MD as Consulting Physician (Orthopedic Surgery) Ruthy Dick, MD as Referring Physician (Ophthalmology)  Patient Active Problem List   Diagnosis Date Noted   Cellulitis and abscess of leg, except foot 11/21/2022   Cellulitis  of right lower extremity without foot 11/20/2022   Cellulitis of right leg 11/14/2022   Anxiety and depression 05/20/2022   Diabetic leg ulcer ([place]) 05/20/2022   AC (acromioclavicular) arthritis 04/24/2022   Cataract, nuclear sclerotic, left eye 03/07/2022   ED (erectile dysfunction) 01/03/2022   Intertrigo 12/24/2021   Gluteal tendinitis of left buttock 12/19/2021   Unilateral primary osteoarthritis, left knee 10/17/2021   Status post total knee replacement, left 10/17/2021   Rib pain on right side 10/10/2021   History of colonic polyps 09/26/2021   Acute cough 07/21/2021   Bursitis of left hip 06/13/2021   Tooth fracture 06/11/2021   Pain and swelling of right lower leg 05/18/2021   Hemorrhagic shock ([place]) 05/05/2021   Gastrointestinal hemorrhage    Diverticulitis of colon    Preop exam for internal medicine 03/04/2021   B12 deficiency 03/04/2021   Vitamin D deficiency 03/01/2021   Myalgia 12/01/2020   Laceration of left leg 09/17/2020   Statin myopathy 08/02/2020   Rotator cuff arthropathy of both shoulders 02/23/2020   (HFpEF) heart failure with preserved ejection fraction ([place]) 02/22/2020   ICD (implantable cardioverter-defibrillator) in place 12/27/2019   COVID-19 virus infection 12/09/2019   COVID-19 12/09/2019   Hypotension 12/09/2019   Upper respiratory infection 11/27/2019   COPD exacerbation ([place]) 11/27/2019   Elevated PSA 09/23/2019   Lip lesion 12/23/2018   Diabetic ulcer of left lower leg with necrosis of muscle ([place])  12/05/2018   Chronic bursitis of left shoulder 07/01/2018   Pain due to total knee replacement ([place]) 03/31/2018   Rotator cuff arthropathy, left 02/13/2018   Right rotator cuff tear arthropathy 01/14/2018   Increased prostate specific antigen (PSA) velocity 09/26/2017   HFrEF (heart failure with reduced ejection fraction) ([place]) 09/18/2017   Trigger point of thoracic region 07/04/2017   Right lumbar radiculopathy 05/15/2017   Right leg swelling  05/09/2017   Glaucoma 04/18/2017   History of nasal polyposis 02/19/2017   Chronic rhinitis 02/19/2017   Microhematuria 12/04/2016   Hyperlipidemia 08/15/2016   Encounter for well adult exam with abnormal findings 11/29/2015   Persistent atrial fibrillation ([place]) 03/30/2015   Syncope 03/30/2015   Acute on chronic diastolic heart failure ([place]) 03/30/2015   Former tobacco use 02/25/2015   Type 2 diabetes mellitus ([place]) 02/25/2015   Permanent atrial fibrillation ([place]) 02/25/2015   Anemia 02/23/2015   Thyrotoxicosis 02/23/2015   Hx of adenomatous colonic polyps 10/21/2014   De Quervain's tenosynovitis, right 04/30/2014   Osteoarthritis of right knee 02/19/2014   Encounter for therapeutic drug monitoring 11/17/2013   Thoracic degenerative disc disease 06/17/2013   Thoracic spondylosis without myelopathy 02/11/2013   Lumbosacral spondylosis without myelopathy 02/11/2013   Degenerative joint disease of knee, left 02/11/2013   COPD (chronic obstructive pulmonary disease) ([place]) 08/20/2012   Bundle branch block, right and extreme left anterior fascicular 05/05/2012   Nonischemic cardiomyopathy ([place]) 05/02/2012   Other primary cardiomyopathies 05/02/2012   Anticoagulated on warfarin 03/21/2012   Spongiotic dermatitis 09/26/2011   Past myocardial infarction 04/19/2011   UNSPECIFIED URETHRAL STRICTURE 12/26/2010   Chronic pain syndrome 05/18/2010   Obstructive sleep apnea 10/21/2007   NEPHROLITHIASIS, HX OF 10/21/2007   Anxiety 06/09/2007   GERD (gastroesophageal reflux disease) 06/09/2007   Benign prostatic hyperplasia 06/09/2007   Morbid obesity ([place]) 06/03/2007   Depression 06/03/2007   Essential hypertension 06/03/2007   INSOMNIA 06/03/2007    Current Outpatient Medications:    acetaminophen (TYLENOL) 500 MG tablet, Take 500-1,000 mg by mouth every 6 (six) hours as needed for mild pain or headache., Disp: , Rfl:    albuterol (VENTOLIN HFA) 108 (90 Base) MCG/ACT inhaler, INHALE 2  PUFFS BY MOUTH 4 TIMES A DAY AS NEEDED, Disp: 54 each, Rfl: 3   ALPRAZolam (XANAX) 0.5 MG tablet, Take 2 tablets (1 mg total) by mouth at bedtime., Disp: 60 tablet, Rfl: 5   ammonium lactate (AMLACTIN) 12 % cream, Apply 1 Application topically as needed for dry skin., Disp: 385 g, Rfl: 3   Aspirin-Acetaminophen-Caffeine (GOODY HEADACHE PO), Take 1 packet by mouth every 6 (six) hours as needed (for pain)., Disp: , Rfl:    augmented betamethasone dipropionate (DIPROLENE-AF) 0.05 % cream, Apply 1 application  topically daily as needed (to affected areas- for itching)., Disp: , Rfl:    carisoprodol (SOMA) 350 MG tablet, TAKE 1 TABLET BY MOUTH THREE TIMES A DAY AS NEEDED FOR MUSCLE SPASM, Disp: 90 tablet, Rfl: 2   carvedilol (COREG) 6.25 MG tablet, TAKE 1 TABLET BY MOUTH TWICE A DAY, Disp: 180 tablet, Rfl: 3   dapagliflozin propanediol (FARXIGA) 10 MG TABS tablet, Take 1 tablet (10 mg total) by mouth daily before breakfast., Disp: 30 tablet, Rfl: 8   doxepin (SINEQUAN) 10 MG capsule, TAKE 2 CAPSULES (20 MG TOTAL) BY MOUTH AT BEDTIME AS NEEDED. (Patient taking differently: Take 20 mg by mouth at bedtime.), Disp: 180 capsule, Rfl: 1   DULoxetine (CYMBALTA) 60 MG capsule, Take 60 mg by  mouth daily., Disp: , Rfl:    Dupilumab (DUPIXENT) 300 MG/2ML SOPN, Inject 300 mg into the skin every 14 (fourteen) days., Disp: , Rfl:    ELIQUIS 5 MG TABS tablet, TAKE 1 TABLET BY MOUTH TWICE A DAY (Patient taking differently: Take 5 mg by mouth in the morning.), Disp: 60 tablet, Rfl: 11   eplerenone (INSPRA) 25 MG tablet, Take 1 tablet (25 mg total) by mouth daily., Disp: 30 tablet, Rfl: 8   Evolocumab (REPATHA SURECLICK) [id number] MG/ML SOAJ, INJECT 1 PEN INTO THE SKIN EVERY 14 (FOURTEEN) DAYS. (Patient taking differently: Inject 140 mg into the skin every 14 (fourteen) days.), Disp: 6 mL, Rfl: 3   fluticasone (FLONASE) 50 MCG/ACT nasal spray, PLACE 2 SPRAYS INTO BOTH NOSTRILS DAILY AS NEEDED FOR ALLERGIES OR RHINITIS., Disp: 48  mL, Rfl: 1   Fluticasone-Umeclidin-Vilant (TRELEGY ELLIPTA) 100-62.5-25 MCG/ACT AEPB, Inhale 1 puff into the lungs daily., Disp: 180 each, Rfl: 2   hydrOXYzine (ATARAX/VISTARIL) 10 MG tablet, TAKE 1 TABLET BY MOUTH 3 TIMES DAILY AS NEEDED FOR ITCHING. (Patient taking differently: Take 5 mg by mouth 3 (three) times daily as needed for itching.), Disp: 60 tablet, Rfl: 2   ipratropium-albuterol (DUONEB) 0.5-2.5 (3) MG/3ML SOLN, TAKE 3 ML BY NEBULIZATION EVERY 4 HOURS AS NEEDED (WHEEZING). (Patient taking differently: Take 3 mLs by nebulization every 4 (four) hours as needed (for wheezing or COPD symptoms).), Disp: 360 mL, Rfl: 0   ketoconazole (NIZORAL) 2 % cream, Apply 1 Application topically daily. (Patient taking differently: Apply 1 Application topically daily as needed for irritation.), Disp: 120 g, Rfl: 1   KLOR-CON M20 20 MEQ tablet, TAKE 1 TABLET BY MOUTH EVERY DAY (Patient taking differently: Take 40 mEq by mouth daily.), Disp: 90 tablet, Rfl: 3   losartan (COZAAR) 25 MG tablet, Take 1 tablet (25 mg total) by mouth daily., Disp: 30 tablet, Rfl: 11   mupirocin ointment (BACTROBAN) 2 %, Place 1 Application into the nose 2 (two) times daily., Disp: 22 g, Rfl: 0   omeprazole (PRILOSEC) 20 MG capsule, TAKE 1 CAPSULE BY MOUTH TWICE A DAY, Disp: 180 capsule, Rfl: 1   predniSONE (DELTASONE) 10 MG tablet, TAKE 1 TABLET (10 MG TOTAL) BY MOUTH DAILY WITH BREAKFAST., Disp: 30 tablet, Rfl: 0   Respiratory Therapy Supplies (FULL KIT NEBULIZER SET) MISC, 1 each by Does not apply route as needed., Disp: 1 each, Rfl: 0   silver sulfADIAZINE (SILVADENE) 1 % cream, APPLY TOPICALLY TO AFFECTED AREA EVERY DAY (Patient taking differently: Apply 1 Application topically daily as needed (to wound sites that won't heal).), Disp: 50 g, Rfl: 3   tamsulosin (FLOMAX) 0.4 MG CAPS capsule, TAKE 1 CAPSULE BY MOUTH EVERY DAY (Patient taking differently: Take 0.4 mg by mouth every evening.), Disp: 90 capsule, Rfl: 3   torsemide  (DEMADEX) 20 MG tablet, Take 4 tablets (80 mg total) by mouth See admin instructions. Take 80 mg by mouth in the morning and evening; HOLD For a few days and resume next week with close follow up with Dr. [name] (Patient taking differently: Take 80 mg by mouth See admin instructions. Take 80 mg by mouth in the morning and evening; HOLD For a few days and resume next week with close follow up with Dr. [name]..No results found for: ....... 12/18/22: pt only taking '40mg'$  BID per statement.), Disp: , Rfl:    traMADol (ULTRAM) 50 MG tablet, TAKE 1 TABLET BY MOUTH EVERY DAY AS NEEDED (Patient taking differently: Take 25 mg by mouth  daily as needed for moderate pain.), Disp: 30 tablet, Rfl: 2   traZODone (DESYREL) 100 MG tablet, TAKE 2 TABLETS BY MOUTH AT BEDTIME AS NEEDED (Patient taking differently: Take 200 mg by mouth at bedtime.), Disp: 180 tablet, Rfl: 1   TUMS 500 MG chewable tablet, Chew 500-2,000 mg by mouth every 4 (four) hours as needed for indigestion or heartburn., Disp: , Rfl:    venlafaxine XR (EFFEXOR XR) 150 MG 24 hr capsule, Take 1 capsule (150 mg total) by mouth daily with breakfast., Disp: 90 capsule, Rfl: 3   zolpidem (AMBIEN) 10 MG tablet, TAKE 1 TABLET BY MOUTH EVERY DAY AT BEDTIME AS NEEDED, Disp: 90 tablet, Rfl: 1   Cholecalciferol (VITAMIN D3) 1000 units CAPS, Take 1,000 Units by mouth daily. (Patient not taking: Reported on 12/27/2022), Disp: , Rfl:    Cholecalciferol 100 MCG (4000 UT) CAPS, 1 tab by mouth once daily (Patient not taking: Reported on 12/27/2022), Disp: 30 capsule, Rfl: 99   ondansetron (ZOFRAN) 4 MG tablet, Take 1 tablet (4 mg total) by mouth every 6 (six) hours as needed for nausea., Disp: 20 tablet, Rfl: 0   senna-docusate (SENOKOT-S) 8.6-50 MG tablet, Take 1 tablet by mouth at bedtime as needed for mild constipation. (Patient not taking: Reported on 12/27/2022), Disp: 30 tablet, Rfl: 0 Allergies  Allergen Reactions   Tape Other (See Comments)    SKIN TEARS VERY, VERY  EASILY!!!! Use Paper Tape Only, PLEASE!!!   Amiodarone Other (See Comments)    Caused hyperthyroidism    Statins Other (See Comments)    Myalgias      Social History   Socioeconomic History   Marital status: Divorced    Spouse name: Not on file   Number of children: 2   Years of education: Not on file   Highest education level: Not on file  Occupational History   Occupation: retired/disabled. prev worked in Therapist, sports.    Employer: DISABLED  Tobacco Use   Smoking status: Former    Packs/day: 2.00    Years: 30.00    Additional pack years: 0.00    Total pack years: 60.00    Types: Cigarettes    Quit date: 10/16/2007    Years since quitting: 15.2   Smokeless tobacco: Never  Vaping Use   Vaping Use: Never used  Substance and Sexual Activity   Alcohol use: No   Drug use: No   Sexual activity: Not Currently  Other Topics Concern   Not on file  Social History Narrative   Lives alone.   Social Determinants of Health   Financial Resource Strain: Low Risk  (06/29/2022)   Overall Financial Resource Strain (CARDIA)    Difficulty of Paying Living Expenses: Not hard at all  Food Insecurity: No Food Insecurity (11/26/2022)   Hunger Vital Sign    Worried About Running Out of Food in the Last Year: Never true    Ran Out of Food in the Last Year: Never true  Transportation Needs: No Transportation Needs (11/26/2022)   PRAPARE - Hydrologist (Medical): No    Lack of Transportation (Non-Medical): No  Physical Activity: Inactive (06/29/2022)   Exercise Vital Sign    Days of Exercise per Week: 0 days    Minutes of Exercise per Session: 0 min  Stress: No Stress Concern Present (06/29/2022)   [place]    Feeling of Stress : Not at all  Social Connections: Moderately  Integrated (06/29/2022)   Social Connection and Isolation Panel [NHANES]    Frequency of Communication with Friends and  Family: More than three times a week    Frequency of Social Gatherings with Friends and Family: More than three times a week    Attends Religious Services: More than 4 times per year    Active Member of Clubs or Organizations: Yes    Attends Music therapist: More than 4 times per year    Marital Status: Divorced  Intimate Partner Violence: Not At Risk (11/22/2022)   Humiliation, Afraid, Rape, and Kick questionnaire    Fear of Current or Ex-Partner: No    Emotionally Abused: No    Physically Abused: No    Sexually Abused: No    Physical Exam      Future Appointments  Date Time Provider [place]  12/28/2022  1:20 PM [name], MD [facility] None  12/31/2022  1:30 PM Margaretha Seeds, MD [facility]  01/02/2023 12:00 PM [facility] 1 [facility]  01/08/2023 12:00 PM [facility] PA/NP [facility] None  01/18/2023  1:15 PM Lyndal Pulley, DO [facility] None  01/21/2023  7:20 AM [facility] DEVICE REMOTES [facility]  01/23/2023  9:05 AM [facility] DEVICE REMOTES [facility]  03/05/2023  1:45 PM Criselda Peaches, DPM [facility]  03/12/2023  1:40 PM Larey Dresser, MD [facility] None  04/24/2023  9:05 AM [facility] DEVICE REMOTES [facility]  07/24/2023  9:05 AM [facility] DEVICE REMOTES [facility]  10/23/2023  9:05 AM [facility] DEVICE REMOTES [facility]  01/22/2024  9:05 AM [facility] DEVICE REMOTES [facility]  04/22/2024  9:05 AM [facility] DEVICE REMOTES [facility]

## 2022-12-28 ENCOUNTER — Ambulatory Visit (INDEPENDENT_AMBULATORY_CARE_PROVIDER_SITE_OTHER): Payer: Medicare HMO | Admitting: Internal Medicine

## 2022-12-28 VITALS — BP 124/70 | HR 72 | Temp 97.9°F | Ht 70.0 in | Wt 276.0 lb

## 2022-12-28 DIAGNOSIS — Z0001 Encounter for general adult medical examination with abnormal findings: Secondary | ICD-10-CM

## 2022-12-28 DIAGNOSIS — E7800 Pure hypercholesterolemia, unspecified: Secondary | ICD-10-CM | POA: Diagnosis not present

## 2022-12-28 DIAGNOSIS — E1165 Type 2 diabetes mellitus with hyperglycemia: Secondary | ICD-10-CM

## 2022-12-28 DIAGNOSIS — E559 Vitamin D deficiency, unspecified: Secondary | ICD-10-CM | POA: Diagnosis not present

## 2022-12-28 DIAGNOSIS — Z125 Encounter for screening for malignant neoplasm of prostate: Secondary | ICD-10-CM | POA: Diagnosis not present

## 2022-12-28 DIAGNOSIS — E538 Deficiency of other specified B group vitamins: Secondary | ICD-10-CM | POA: Diagnosis not present

## 2022-12-28 MED ORDER — ONDANSETRON HCL 4 MG PO TABS: 4.0000 mg | ORAL_TABLET | Freq: Four times a day (QID) | ORAL | 0 refills | Status: DC | PRN

## 2022-12-28 MED ORDER — HYDROXYZINE HCL 10 MG PO TABS: ORAL_TABLET | ORAL | 2 refills | Status: DC

## 2022-12-28 NOTE — Patient Instructions (Signed)
Please continue all other medications as before, and refills have been done if requested.  Please have the pharmacy call with any other refills you may need.  Please continue your efforts at being more active, low cholesterol diet, and weight control.  You are otherwise up to date with prevention measures today.  Please keep your appointments with your specialists as you may have planned  Please go to the LAB at the blood drawing area for the tests to be done  You will be contacted by phone if any changes need to be made immediately.  Otherwise, you will receive a letter about your results with an explanation, but please check with MyChart first.  Please remember to sign up for MyChart if you have not done so, as this will be important to you in the future with finding out test results, communicating by private email, and scheduling acute appointments online when needed.  Please make an Appointment to return in 6 months, or sooner if needed 

## 2022-12-28 NOTE — Progress Notes (Unsigned)
Patient ID: [name], male   DOB: 1956-06-27, 67 y.o.   MRN: [id number]         Chief Complaint:: wellness exam and Medical Management of Chronic Issues (Possible hospital follow up, recheck cellulitis of both legs)  , htn, hld, dm, low B12, low Vit D       HPI:  [name] is a 67 y.o. male here for wellness exam; decliens colonoscopy for now, o/w up to date                        Also Pt denies chest pain, increased sob or doe, wheezing, orthopnea, PND, increased LE swelling, palpitations, dizziness or syncope.   Pt denies polydipsia, polyuria, or new focal neuro s/s.    Pt denies fever, wt loss, night sweats, loss of appetite, or other constitutional symptoms  Did have episode leg cellulitis now resolved redness, swelling, tender.     Wt Readings from Last 3 Encounters:  12/28/22 276 lb (125.2 kg)  12/18/22 283 lb (128.4 kg)  12/18/22 280 lb 3.2 oz (127.1 kg)   BP Readings from Last 3 Encounters:  12/28/22 124/70  12/27/22 108/80  12/18/22 104/64   Immunization History  Administered Date(s) Administered   Fluad Quad(high Dose 65+) 06/29/2022   Influenza Inj Mdck Quad With Preservative 07/22/2019   Influenza Split 07/09/2011, 07/23/2012   Influenza Whole 09/16/2006, 08/26/2007, 07/27/2008, 07/26/2009, 07/07/2010   Influenza, High Dose Seasonal PF 06/22/2020, 07/07/2021   Influenza,inj,Quad PF,6+ Mos 06/30/2013, 08/19/2014, 06/27/2015, 07/13/2016, 07/09/2017, 07/18/2018   PFIZER(Purple Top)SARS-COV-2 Vaccination 03/21/2020, 04/11/2020, 11/03/2020   Pneumococcal Conjugate-13 04/29/2017   Pneumococcal Polysaccharide-23 03/29/2010, 07/07/2010, 02/23/2015, 03/17/2021   Td 10/16/2003   Tdap 02/23/2015   Health Maintenance Due  Topic Date Due   HEMOGLOBIN A1C  06/22/2022   Diabetic kidney evaluation - Urine ACR  12/21/2022      Past Medical History:  Diagnosis Date   AICD (automatic cardioverter/defibrillator) present    Anemia    supposed to be taking Vit B but  doesn't   ANXIETY    takes Xanax nightly   Arthritis    Asthma    Albuterol prn and Advair daily;also takes Prednisone daily   Cardiomyopathy ([place])    a. EF 25% TEE July 2013; b. EF normalized 2015;  c. 03/2015 Echo: EF 40-45%, difrf HK, PASP 38 mmHg, Mild MR, sev LAE/RAE.   Chronic constipation    takes OTC stool softener   COPD (chronic obstructive pulmonary disease) (HCC)    "one dr says COPD; one dr says emphysema" (09/18/2017)   DEPRESSION    takes Zoloft and Doxepin daily   Diverticulitis    DYSKINESIA, ESOPHAGUS    Essential hypertension        FIBROMYALGIA    GERD (gastroesophageal reflux disease)        Glaucoma    HYPERLIPIDEMIA    a. Intolerant to statins.   INSOMNIA    takes Ambien nightly   Myocardial infarction [facility])    a. 2012 Myoview notable for prior infarct;  b. 03/2015 Lexiscan CL: EF 37%, diff HK, small area of inferior infarct from apex to base-->Med Rx.   O2 dependent    "2.5L q hs & prn" (09/18/2017)   Paroxysmal atrial fibrillation ([place])    a. CHA2DS2VASc = 3--> takes Coumadin;  b. 03/15/2015 Successful TEE/DCCV;  c. 03/2015 recurrent afib, Amio d/c'd in setting of hyperthyroidism.   Peripheral neuropathy    Pneumonia 12/2016  Rash and other nonspecific skin eruption 04/12/2009   no cause found saw dermatologists x 2 and allergist   SLEEP APNEA, OBSTRUCTIVE    a. doesn't use CPAP   Syncope    a. 03/2015 s/p MDT LINQ.   Type II diabetes mellitus ([place])        Past Surgical History:  Procedure Laterality Date   ACNE CYST REMOVAL     2 on back    AV NODE ABLATION N/A 10/25/2017   Procedure: AV NODE ABLATION;  Surgeon: Deboraha Sprang, MD;  Location: [place] [facility];  Service: Cardiovascular;  Laterality: N/A;   BIV ICD INSERTION CRT-D N/A 09/18/2017   Procedure: BIV ICD INSERTION CRT-D;  Surgeon: Deboraha Sprang, MD;  Location: [place] [facility];  Service: Cardiovascular;  Laterality: N/A;   CARDIAC CATHETERIZATION N/A 03/21/2016   Procedure:  Right/Left Heart Cath and Coronary Angiography;  Surgeon: Larey Dresser, MD;  Location: [place] [facility];  Service: Cardiovascular;  Laterality: N/A;   CARDIOVERSION  04/18/2012   Procedure: CARDIOVERSION;  Surgeon: Fay Records, MD;  Location: [place];  Service: Cardiovascular;  Laterality: N/A;   CARDIOVERSION  04/25/2012   Procedure: CARDIOVERSION;  Surgeon: Thayer Headings, MD;  Location: [place];  Service: Cardiovascular;  Laterality: N/A;   CARDIOVERSION  04/25/2012   Procedure: CARDIOVERSION;  Surgeon: Fay Records, MD;  Location: [place];  Service: Cardiovascular;  Laterality: N/A;   CARDIOVERSION  05/09/2012   Procedure: CARDIOVERSION;  Surgeon: Sherren Mocha, MD;  Location: [place];  Service: Cardiovascular;  Laterality: N/A;  changed from crenshaw to cooper by trish/leone-endo   CARDIOVERSION N/A 03/15/2015   Procedure: CARDIOVERSION;  Surgeon: Thayer Headings, MD;  Location: [place];  Service: Cardiovascular;  Laterality: N/A;   COLONOSCOPY     COLONOSCOPY WITH PROPOFOL N/A 10/21/2014   Procedure: COLONOSCOPY WITH PROPOFOL;  Surgeon: Ladene Artist, MD;  Location: [facility];  Service: Endoscopy;  Laterality: N/A;   EP IMPLANTABLE DEVICE N/A 04/06/2015   Procedure: Loop Recorder Insertion;  Surgeon: Evans Lance, MD;  Location: [place] [facility];  Service: Cardiovascular;  Laterality: N/A;   ESOPHAGOGASTRODUODENOSCOPY     JOINT REPLACEMENT     LOOP RECORDER REMOVAL N/A 09/18/2017   Procedure: LOOP RECORDER REMOVAL;  Surgeon: Deboraha Sprang, MD;  Location: [place] [facility];  Service: Cardiovascular;  Laterality: N/A;   RIGHT/LEFT HEART CATH AND CORONARY ANGIOGRAPHY N/A 01/28/2017   Procedure: Right/Left Heart Cath and Coronary Angiography;  Surgeon: Larey Dresser, MD;  Location: [place] [facility];  Service: Cardiovascular;  Laterality: N/A;   TEE WITHOUT CARDIOVERSION  04/25/2012   Procedure: TRANSESOPHAGEAL ECHOCARDIOGRAM (TEE);  Surgeon: Thayer Headings, MD;  Location: [place];  Service: Cardiovascular;  Laterality: N/A;   TEE WITHOUT CARDIOVERSION N/A 03/15/2015   Procedure: TRANSESOPHAGEAL ECHOCARDIOGRAM (TEE);  Surgeon: Thayer Headings, MD;  Location: [place];  Service: Cardiovascular;  Laterality: N/A;   TONSILLECTOMY AND ADENOIDECTOMY     TOTAL KNEE ARTHROPLASTY Right 06/15/2014   Procedure: TOTAL KNEE ARTHROPLASTY;  Surgeon: Renette Butters, MD;  Location: [place];  Service: Orthopedics;  Laterality: Right;   TOTAL KNEE ARTHROPLASTY Left 10/17/2021   Procedure: Left TOTAL KNEE ARTHROPLASTY;  Surgeon: Mcarthur Rossetti, MD;  Location: [place];  Service: Orthopedics;  Laterality: Left;    reports that he quit smoking about 15 years ago. His smoking use included cigarettes. He has a 60.00 pack-year smoking history. He has  never used smokeless tobacco. He reports that he does not drink alcohol and does not use drugs. family history includes Allergies in his mother; Asthma in his maternal grandmother and mother; COPD in his mother; Colon polyps in his mother; Hypothyroidism in his mother. Allergies  Allergen Reactions   Tape Other (See Comments)    SKIN TEARS VERY, VERY EASILY!!!! Use Paper Tape Only, PLEASE!!!   Amiodarone Other (See Comments)    Caused hyperthyroidism    Statins Other (See Comments)    Myalgias    Wound Dressing Adhesive Other (See Comments)    Skin Tears Use Paper Tape Only   Current Outpatient Medications on File Prior to Visit  Medication Sig Dispense Refill   acetaminophen (TYLENOL) 500 MG tablet Take 500-1,000 mg by mouth every 6 (six) hours as needed for mild pain or headache.     albuterol (VENTOLIN HFA) 108 (90 Base) MCG/ACT inhaler INHALE 2 PUFFS BY MOUTH 4 TIMES A DAY AS NEEDED 54 each 3   ALPRAZolam (XANAX) 0.5 MG tablet Take 2 tablets (1 mg total) by mouth at bedtime. 60 tablet 5   ammonium lactate (AMLACTIN) 12 % cream Apply 1 Application topically as needed for dry skin. 385 g 3   Aspirin-Acetaminophen-Caffeine  (GOODY HEADACHE PO) Take 1 packet by mouth every 6 (six) hours as needed (for pain).     augmented betamethasone dipropionate (DIPROLENE-AF) 0.05 % cream Apply 1 application  topically daily as needed (to affected areas- for itching).     carisoprodol (SOMA) 350 MG tablet TAKE 1 TABLET BY MOUTH THREE TIMES A DAY AS NEEDED FOR MUSCLE SPASM 90 tablet 2   carvedilol (COREG) 6.25 MG tablet TAKE 1 TABLET BY MOUTH TWICE A DAY 180 tablet 3   clobetasol cream (TEMOVATE) 0.05 % Apply topically.     dapagliflozin propanediol (FARXIGA) 10 MG TABS tablet Take 1 tablet (10 mg total) by mouth daily before breakfast. 30 tablet 8   digoxin (LANOXIN) 0.125 MG tablet Take by mouth.     diltiazem (CARDIZEM CD) 120 MG 24 hr capsule Take by mouth.     doxepin (SINEQUAN) 10 MG capsule TAKE 2 CAPSULES (20 MG TOTAL) BY MOUTH AT BEDTIME AS NEEDED. (Patient taking differently: Take 20 mg by mouth at bedtime.) 180 capsule 1   DULoxetine (CYMBALTA) 60 MG capsule Take 60 mg by mouth daily.     Dupilumab (DUPIXENT) 300 MG/2ML SOPN Inject 300 mg into the skin every 14 (fourteen) days.     ELIQUIS 5 MG TABS tablet TAKE 1 TABLET BY MOUTH TWICE A DAY (Patient taking differently: Take 5 mg by mouth in the morning.) 60 tablet 11   eplerenone (INSPRA) 25 MG tablet Take 1 tablet (25 mg total) by mouth daily. 30 tablet 8   Evolocumab (REPATHA SURECLICK) [id number] MG/ML SOAJ INJECT 1 PEN INTO THE SKIN EVERY 14 (FOURTEEN) DAYS. (Patient taking differently: Inject 140 mg into the skin every 14 (fourteen) days.) 6 mL 3   fluocinonide cream (LIDEX) 0.05 % Apply topically.     fluticasone (FLONASE) 50 MCG/ACT nasal spray PLACE 2 SPRAYS INTO BOTH NOSTRILS DAILY AS NEEDED FOR ALLERGIES OR RHINITIS. 48 mL 1   fluticasone-salmeterol (ADVAIR DISKUS) 250-50 MCG/ACT AEPB Inhale into the lungs.     Fluticasone-Umeclidin-Vilant (TRELEGY ELLIPTA) 100-62.5-25 MCG/ACT AEPB Inhale 1 puff into the lungs daily. 180 each 2   furosemide (LASIX) 40 MG tablet Take by  mouth.     ipratropium-albuterol (DUONEB) 0.5-2.5 (3) MG/3ML SOLN TAKE 3 ML BY  NEBULIZATION EVERY 4 HOURS AS NEEDED (WHEEZING). (Patient taking differently: Take 3 mLs by nebulization every 4 (four) hours as needed (for wheezing or COPD symptoms).) 360 mL 0   ketoconazole (NIZORAL) 2 % cream Apply 1 Application topically daily. (Patient taking differently: Apply 1 Application topically daily as needed for irritation.) 120 g 1   KLOR-CON M20 20 MEQ tablet TAKE 1 TABLET BY MOUTH EVERY DAY (Patient taking differently: Take 40 mEq by mouth daily.) 90 tablet 3   lisinopril (ZESTRIL) 5 MG tablet Take by mouth.     losartan (COZAAR) 25 MG tablet Take 1 tablet (25 mg total) by mouth daily. 30 tablet 11   mupirocin ointment (BACTROBAN) 2 % Place 1 Application into the nose 2 (two) times daily. 22 g 0   omeprazole (PRILOSEC) 20 MG capsule TAKE 1 CAPSULE BY MOUTH TWICE A DAY 180 capsule 1   Omeprazole 20 MG TBDD Take by mouth.     polyethylene glycol (MIRALAX / GLYCOLAX) 17 g packet Take by mouth.     potassium chloride (KLOR-CON) 20 MEQ packet Take by mouth.     predniSONE (DELTASONE) 10 MG tablet TAKE 1 TABLET (10 MG TOTAL) BY MOUTH DAILY WITH BREAKFAST. 30 tablet 0   Respiratory Therapy Supplies (FULL KIT NEBULIZER SET) MISC 1 each by Does not apply route as needed. 1 each 0   sertraline (ZOLOFT) 100 MG tablet Take by mouth.     silver sulfADIAZINE (SILVADENE) 1 % cream APPLY TOPICALLY TO AFFECTED AREA EVERY DAY (Patient taking differently: Apply 1 Application topically daily as needed (to wound sites that won't heal).) 50 g 3   tamsulosin (FLOMAX) 0.4 MG CAPS capsule TAKE 1 CAPSULE BY MOUTH EVERY DAY (Patient taking differently: Take 0.4 mg by mouth every evening.) 90 capsule 3   torsemide (DEMADEX) 20 MG tablet Take 4 tablets (80 mg total) by mouth See admin instructions. Take 80 mg by mouth in the morning and evening; HOLD For a few days and resume next week with close follow up with Dr. [name] (Patient  taking differently: Take 80 mg by mouth See admin instructions. Take 80 mg by mouth in the morning and evening; HOLD For a few days and resume next week with close follow up with Dr. [name]..No results found for: ....... 12/18/22: pt only taking 40mg  BID per statement.)     traMADol (ULTRAM) 50 MG tablet TAKE 1 TABLET BY MOUTH EVERY DAY AS NEEDED (Patient taking differently: Take 25 mg by mouth daily as needed for moderate pain.) 30 tablet 2   traZODone (DESYREL) 100 MG tablet TAKE 2 TABLETS BY MOUTH AT BEDTIME AS NEEDED (Patient taking differently: Take 200 mg by mouth at bedtime.) 180 tablet 1   TUMS 500 MG chewable tablet Chew 500-2,000 mg by mouth every 4 (four) hours as needed for indigestion or heartburn.     venlafaxine XR (EFFEXOR XR) 150 MG 24 hr capsule Take 1 capsule (150 mg total) by mouth daily with breakfast. 90 capsule 3   zolpidem (AMBIEN) 10 MG tablet TAKE 1 TABLET BY MOUTH EVERY DAY AT BEDTIME AS NEEDED 90 tablet 1   Cholecalciferol (VITAMIN D3) 1000 units CAPS Take 1,000 Units by mouth daily. (Patient not taking: Reported on 12/27/2022)     Cholecalciferol 100 MCG (4000 UT) CAPS 1 tab by mouth once daily (Patient not taking: Reported on 12/27/2022) 30 capsule 99   senna-docusate (SENOKOT-S) 8.6-50 MG tablet Take 1 tablet by mouth at bedtime as needed for mild constipation. (Patient  not taking: Reported on 12/27/2022) 30 tablet 0   No current facility-administered medications on file prior to visit.        ROS:  All others reviewed and negative.  Objective        PE:  BP 124/70   Pulse 72   Temp 97.9 F (36.6 C) (Oral)   Ht 5\' 10"  (1.778 m)   Wt 276 lb (125.2 kg)   SpO2 94%   BMI 39.60 kg/m                 Constitutional: Pt appears in NAD               HENT: Head: NCAT.                Right Ear: External ear normal.                 Left Ear: External ear normal.                Eyes: . Pupils are equal, round, and reactive to light. Conjunctivae and EOM are normal                Nose: without d/c or deformity               Neck: Neck supple. Gross normal ROM               Cardiovascular: Normal rate and regular rhythm.                 Pulmonary/Chest: Effort normal and breath sounds without rales or wheezing.                Abd:  Soft, NT, ND, + BS, no organomegaly               Neurological: Pt is alert. At baseline orientation, motor grossly intact               Skin: Skin is warm. No rashes, no other new lesions, LE edema - chronic trace bilateral               Psychiatric: Pt behavior is normal without agitation   Micro: none  Cardiac tracings I have personally interpreted today:  none  Pertinent Radiological findings (summarize): none   Lab Results  Component Value Date   WBC 10.1 11/24/2022   HGB 13.2 11/24/2022   HCT 41.7 11/24/2022   PLT 285 11/24/2022   GLUCOSE 85 12/18/2022   CHOL 99 12/20/2021   TRIG 132.0 12/20/2021   HDL 45.20 12/20/2021   LDLDIRECT 72.0 02/28/2021   LDLCALC 28 12/20/2021   ALT 14 11/24/2022   AST 21 11/24/2022   NA 135 12/18/2022   K 4.5 12/18/2022   CL 102 12/18/2022   CREATININE 1.13 12/18/2022   BUN 36 (H) 12/18/2022   CO2 27 12/18/2022   TSH 1.09 12/20/2021   PSA 2.62 12/20/2021   INR 1.1 05/08/2021   HGBA1C 5.4 12/20/2021   MICROALBUR <0.7 12/20/2021   Assessment/Plan:  CHUNG SUAREZ is a 67 y.o. White or Caucasian [1] male with  has a past medical history of AICD (automatic cardioverter/defibrillator) present, Anemia, ANXIETY, Arthritis, Asthma, Cardiomyopathy ([place]), Chronic constipation, COPD (chronic obstructive pulmonary disease) ([place]), DEPRESSION, Diverticulitis, DYSKINESIA, ESOPHAGUS, Essential hypertension, FIBROMYALGIA, GERD (gastroesophageal reflux disease), Glaucoma, HYPERLIPIDEMIA, INSOMNIA, Myocardial infarction ([place]), O2 dependent, Paroxysmal atrial fibrillation ([place]), Peripheral neuropathy, Pneumonia (12/2016), Rash and other nonspecific skin eruption (04/12/2009), SLEEP APNEA, OBSTRUCTIVE,  Syncope,  and Type II diabetes mellitus ([place]).  Encounter for well adult exam with abnormal findings Age and sex appropriate education and counseling updated with regular exercise and diet Referrals for preventative services - declines colonoscopy Immunizations addressed - none needed Smoking counseling  - none needed Evidence for depression or other mood disorder - none significant Most recent labs reviewed. I have personally reviewed and have noted: 1) the patient's medical and social history 2) The patient's current medications and supplements 3) The patient's height, weight, and BMI have been recorded in the chart   Hyperlipidemia Lab Results  Component Value Date   LDLCALC 28 12/20/2021   Stable, pt to continue current statin repatha 140 mg every 2 wks   Type 2 diabetes mellitus ([place]) Lab Results  Component Value Date   HGBA1C 5.4 12/20/2021   Stable, pt to continue current medical treatment farxiga 10 mg qd   B12 deficiency Lab Results  Component Value Date   VITAMINB12 706 12/20/2021   Stable, cont oral replacement - b12 1000 mcg qd   Vitamin D deficiency Last vitamin D Lab Results  Component Value Date   VD25OH 34.25 12/20/2021   Low, to start oral replacement  Followup: Return in about 6 months (around 06/30/2023).  [name], MD 12/30/2022 4:33 PM [facility]

## 2022-12-30 ENCOUNTER — Encounter: Payer: Self-pay | Admitting: Internal Medicine

## 2022-12-30 NOTE — Assessment & Plan Note (Signed)
Age and sex appropriate education and counseling updated with regular exercise and diet Referrals for preventative services - declines colonoscopy Immunizations addressed - none needed Smoking counseling  - none needed Evidence for depression or other mood disorder - none significant Most recent labs reviewed. I have personally reviewed and have noted: 1) the patient's medical and social history 2) The patient's current medications and supplements 3) The patient's height, weight, and BMI have been recorded in the chart  

## 2022-12-30 NOTE — Assessment & Plan Note (Signed)
Lab Results  Component Value Date   LDLCALC 28 12/20/2021   Stable, pt to continue current statin repatha 140 mg every 2 wks

## 2022-12-30 NOTE — Assessment & Plan Note (Signed)
Lab Results  ?Component Value Date  ? VITAMINB12 706 12/20/2021  ? ?Stable, cont oral replacement - b12 1000 mcg qd ? ?

## 2022-12-30 NOTE — Assessment & Plan Note (Signed)
Last vitamin D Lab Results  Component Value Date   VD25OH 34.25 12/20/2021   Low, to start oral replacement

## 2022-12-30 NOTE — Assessment & Plan Note (Signed)
Lab Results  Component Value Date   HGBA1C 5.4 12/20/2021   Stable, pt to continue current medical treatment farxiga 10 mg qd  

## 2022-12-31 ENCOUNTER — Ambulatory Visit (HOSPITAL_BASED_OUTPATIENT_CLINIC_OR_DEPARTMENT_OTHER): Payer: Medicare HMO | Admitting: Pulmonary Disease

## 2022-12-31 ENCOUNTER — Encounter (HOSPITAL_BASED_OUTPATIENT_CLINIC_OR_DEPARTMENT_OTHER): Payer: Self-pay | Admitting: Pulmonary Disease

## 2022-12-31 ENCOUNTER — Other Ambulatory Visit: Payer: Self-pay | Admitting: Internal Medicine

## 2022-12-31 VITALS — BP 118/70 | HR 75 | Ht 70.0 in | Wt 285.0 lb

## 2022-12-31 DIAGNOSIS — I428 Other cardiomyopathies: Secondary | ICD-10-CM

## 2022-12-31 DIAGNOSIS — G4733 Obstructive sleep apnea (adult) (pediatric): Secondary | ICD-10-CM | POA: Diagnosis not present

## 2022-12-31 DIAGNOSIS — I4819 Other persistent atrial fibrillation: Secondary | ICD-10-CM

## 2022-12-31 DIAGNOSIS — I255 Ischemic cardiomyopathy: Secondary | ICD-10-CM

## 2022-12-31 LAB — HEPATIC FUNCTION PANEL
ALT: 17 U/L (ref 0–53)
AST: 15 U/L (ref 0–37)
Albumin: 3.7 g/dL (ref 3.5–5.2)
Alkaline Phosphatase: 64 U/L (ref 39–117)
Bilirubin, Direct: 0.1 mg/dL (ref 0.0–0.3)
Total Bilirubin: 0.3 mg/dL (ref 0.2–1.2)
Total Protein: 6.1 g/dL (ref 6.0–8.3)

## 2022-12-31 LAB — PSA: PSA: 3.02 ng/mL (ref 0.10–4.00)

## 2022-12-31 LAB — URINALYSIS, ROUTINE W REFLEX MICROSCOPIC
Hgb urine dipstick: NEGATIVE
Leukocytes,Ua: NEGATIVE
Nitrite: NEGATIVE
Specific Gravity, Urine: 1.025 (ref 1.000–1.030)
Total Protein, Urine: NEGATIVE
Urine Glucose: 100 — AB
Urobilinogen, UA: 0.2 (ref 0.0–1.0)
pH: 6 (ref 5.0–8.0)

## 2022-12-31 LAB — MICROALBUMIN / CREATININE URINE RATIO
Creatinine,U: 155.8 mg/dL
Microalb Creat Ratio: 0.9 mg/g (ref 0.0–30.0)
Microalb, Ur: 1.5 mg/dL (ref 0.0–1.9)

## 2022-12-31 LAB — TSH: TSH: 0.99 u[IU]/mL (ref 0.35–5.50)

## 2022-12-31 LAB — LIPID PANEL
Cholesterol: 164 mg/dL (ref 0–200)
HDL: 65.8 mg/dL (ref >39.00)
LDL Cholesterol: 79 mg/dL (ref 0–99)
NonHDL: 98
Total CHOL/HDL Ratio: 2
Triglycerides: 96 mg/dL (ref 0.0–149.0)
VLDL: 19.2 mg/dL (ref 0.0–40.0)

## 2022-12-31 LAB — VITAMIN D 25 HYDROXY (VIT D DEFICIENCY, FRACTURES): VITD: 20.52 ng/mL — ABNORMAL LOW (ref 30.00–100.00)

## 2022-12-31 LAB — BASIC METABOLIC PANEL
BUN: 36 mg/dL — ABNORMAL HIGH (ref 6–23)
CO2: 28 mEq/L (ref 19–32)
Calcium: 8.8 mg/dL (ref 8.4–10.5)
Chloride: 100 mEq/L (ref 96–112)
Creatinine, Ser: 1.27 mg/dL (ref 0.40–1.50)
GFR: 58.24 mL/min — ABNORMAL LOW (ref >60.00)
Glucose, Bld: 90 mg/dL (ref 70–99)
Potassium: 5.2 mEq/L — ABNORMAL HIGH (ref 3.5–5.1)
Sodium: 136 mEq/L (ref 135–145)

## 2022-12-31 LAB — CBC WITH DIFFERENTIAL/PLATELET
Basophils Absolute: 0 10*3/uL (ref 0.0–0.1)
Basophils Relative: 0.2 % (ref 0.0–3.0)
Eosinophils Absolute: 0.1 10*3/uL (ref 0.0–0.7)
Eosinophils Relative: 1.2 % (ref 0.0–5.0)
HCT: 40.1 % (ref 39.0–52.0)
Hemoglobin: 13 g/dL (ref 13.0–17.0)
Lymphocytes Relative: 11 % — ABNORMAL LOW (ref 12.0–46.0)
Lymphs Abs: 1.1 10*3/uL (ref 0.7–4.0)
MCHC: 32.4 g/dL (ref 30.0–36.0)
MCV: 98.7 fl (ref 78.0–100.0)
Monocytes Absolute: 0.8 10*3/uL (ref 0.1–1.0)
Monocytes Relative: 7.9 % (ref 3.0–12.0)
Neutro Abs: 7.7 10*3/uL (ref 1.4–7.7)
Neutrophils Relative %: 79.7 % — ABNORMAL HIGH (ref 43.0–77.0)
Platelets: 270 10*3/uL (ref 150.0–400.0)
RBC: 4.06 Mil/uL — ABNORMAL LOW (ref 4.22–5.81)
RDW: 14.1 % (ref 11.5–15.5)
WBC: 9.7 10*3/uL (ref 4.0–10.5)

## 2022-12-31 LAB — VITAMIN B12: Vitamin B-12: 835 pg/mL (ref 211–911)

## 2022-12-31 LAB — HEMOGLOBIN A1C: Hgb A1c MFr Bld: 5.8 % (ref 4.6–6.5)

## 2022-12-31 MED ORDER — PREDNISONE 10 MG PO TABS: 40.0000 mg | ORAL_TABLET | Freq: Every day | ORAL | 0 refills | Status: AC

## 2022-12-31 NOTE — Progress Notes (Signed)
Subjective:   PATIENT ID: [name] GENDER: male DOB: 1956/01/10, MRN: [id number]  Chief Complaint  Patient presents with   Follow-up    Hasn't been doing well overall, states breathing has been fine    Reason for Visit: Follow-up  [name] is a 67 year old male former smoker with COPD, childhood asthma, OSA not on CPAP, DM2, HTN, atrial fib on eliquis, NICM s/p AICD, chronic diastolic heart failure who presents for shortness of breath.  He was recently treated for COPD exacerbation on 09/24/22. Treated with augmentin and doxycycline. On duonebs and albuterol. Referred to Pulmonary. Cough and wheezing has improved. He reports shortness of breath with walking a few steps. Denies chronic cough. Has baseline wheezing. Albuterol usually controls his symptoms. Currently on Dupixent which is managed by his dermatologist. Has been on it for two years and feels this helps his breathing. Before the pandemic [facility] [place] [facility] [place] [facility].   12/31/22 Since our last visit he has had worsening shortness of breath associated with exertional wheezing. Uses nebulizer twice a day. Compliant with Trelegy daily. Congestion has worsened with the pollen. Reports improved cough and wheezing overall but still persistent. Denies recent fevers, chills. He was treated for RLE cellulitis last month but also treated for heart failure while inpatient.   Social History: Quit in 2009. Former smoker 60 pack -years   Past Medical History:  Diagnosis Date   AICD (automatic cardioverter/defibrillator) present    Anemia    supposed to be taking Vit B but doesn't   ANXIETY    takes Xanax nightly   Arthritis    Asthma    Albuterol prn and Advair daily;also takes Prednisone daily   Cardiomyopathy ([place])    a. EF 25% TEE July 2013; b. EF normalized 2015;  c. 03/2015 Echo: EF 40-45%, difrf HK, PASP 38 mmHg, Mild MR, sev LAE/RAE.   Chronic constipation    takes OTC stool softener    COPD (chronic obstructive pulmonary disease) (HCC)    "one dr says COPD; one dr says emphysema" (09/18/2017)   COVID-19 12/09/2019   DEPRESSION    takes Zoloft and Doxepin daily   Diverticulitis    DYSKINESIA, ESOPHAGUS    Essential hypertension        FIBROMYALGIA    GERD (gastroesophageal reflux disease)        Glaucoma    HYPERLIPIDEMIA    a. Intolerant to statins.   INSOMNIA    takes Ambien nightly   Myocardial infarction [facility])    a. 2012 Myoview notable for prior infarct;  b. 03/2015 Lexiscan CL: EF 37%, diff HK, small area of inferior infarct from apex to base-->Med Rx.   O2 dependent    "2.5L q hs & prn" (09/18/2017)   Paroxysmal atrial fibrillation ([place])    a. CHA2DS2VASc = 3--> takes Coumadin;  b. 03/15/2015 Successful TEE/DCCV;  c. 03/2015 recurrent afib, Amio d/c'd in setting of hyperthyroidism.   Peripheral neuropathy    Pneumonia 12/2016   Rash and other nonspecific skin eruption 04/12/2009   no cause found saw dermatologists x 2 and allergist   SLEEP APNEA, OBSTRUCTIVE    a. doesn't use CPAP   Syncope    a. 03/2015 s/p MDT LINQ.   Type II diabetes mellitus (HCC)          Family History  Problem Relation Age of Onset   COPD Mother    Asthma Mother  Colon polyps Mother    Allergies Mother    Hypothyroidism Mother    Asthma Maternal Grandmother    Colon cancer Neg Hx      Social History   Occupational History   Occupation: retired/disabled. prev worked in Therapist, sports.    Employer: DISABLED  Tobacco Use   Smoking status: Former    Packs/day: 2.00    Years: 30.00    Additional pack years: 0.00    Total pack years: 60.00    Types: Cigarettes    Quit date: 10/16/2007    Years since quitting: 15.2   Smokeless tobacco: Never  Vaping Use   Vaping Use: Never used  Substance and Sexual Activity   Alcohol use: No   Drug use: No   Sexual activity: Not Currently    Allergies  Allergen Reactions   Tape Other (See Comments)    SKIN TEARS VERY, VERY  EASILY!!!! Use Paper Tape Only, PLEASE!!!   Amiodarone Other (See Comments)    Caused hyperthyroidism    Statins Other (See Comments)    Myalgias    Wound Dressing Adhesive Other (See Comments)    Skin Tears Use Paper Tape Only     Outpatient Medications Prior to Visit  Medication Sig Dispense Refill   acetaminophen (TYLENOL) 500 MG tablet Take 500-1,000 mg by mouth every 6 (six) hours as needed for mild pain or headache.     albuterol (VENTOLIN HFA) 108 (90 Base) MCG/ACT inhaler INHALE 2 PUFFS BY MOUTH 4 TIMES A DAY AS NEEDED 54 each 3   ALPRAZolam (XANAX) 0.5 MG tablet Take 2 tablets (1 mg total) by mouth at bedtime. 60 tablet 5   ammonium lactate (AMLACTIN) 12 % cream Apply 1 Application topically as needed for dry skin. 385 g 3   Aspirin-Acetaminophen-Caffeine (GOODY HEADACHE PO) Take 1 packet by mouth every 6 (six) hours as needed (for pain).     augmented betamethasone dipropionate (DIPROLENE-AF) 0.05 % cream Apply 1 application  topically daily as needed (to affected areas- for itching).     carisoprodol (SOMA) 350 MG tablet TAKE 1 TABLET BY MOUTH THREE TIMES A DAY AS NEEDED FOR MUSCLE SPASM 90 tablet 2   carvedilol (COREG) 6.25 MG tablet TAKE 1 TABLET BY MOUTH TWICE A DAY 180 tablet 3   Cholecalciferol (VITAMIN D3) 1000 units CAPS Take 1,000 Units by mouth daily.     Cholecalciferol 100 MCG (4000 UT) CAPS 1 tab by mouth once daily 30 capsule 99   clobetasol cream (TEMOVATE) 0.05 % Apply topically.     dapagliflozin propanediol (FARXIGA) 10 MG TABS tablet Take 1 tablet (10 mg total) by mouth daily before breakfast. 30 tablet 8   digoxin (LANOXIN) 0.125 MG tablet Take by mouth.     diltiazem (CARDIZEM CD) 120 MG 24 hr capsule Take by mouth.     doxepin (SINEQUAN) 10 MG capsule TAKE 2 CAPSULES (20 MG TOTAL) BY MOUTH AT BEDTIME AS NEEDED. (Patient taking differently: Take 20 mg by mouth at bedtime.) 180 capsule 1   DULoxetine (CYMBALTA) 60 MG capsule Take 60 mg by mouth daily.      Dupilumab (DUPIXENT) 300 MG/2ML SOPN Inject 300 mg into the skin every 14 (fourteen) days.     ELIQUIS 5 MG TABS tablet TAKE 1 TABLET BY MOUTH TWICE A DAY (Patient taking differently: Take 5 mg by mouth in the morning.) 60 tablet 11   eplerenone (INSPRA) 25 MG tablet Take 1 tablet (25 mg total) by mouth daily. [place]  tablet 8   Evolocumab (REPATHA SURECLICK) [id number] MG/ML SOAJ INJECT 1 PEN INTO THE SKIN EVERY 14 (FOURTEEN) DAYS. (Patient taking differently: Inject 140 mg into the skin every 14 (fourteen) days.) 6 mL 3   fluocinonide cream (LIDEX) 0.05 % Apply topically.     fluticasone (FLONASE) 50 MCG/ACT nasal spray PLACE 2 SPRAYS INTO BOTH NOSTRILS DAILY AS NEEDED FOR ALLERGIES OR RHINITIS. 48 mL 1   fluticasone-salmeterol (ADVAIR DISKUS) 250-50 MCG/ACT AEPB Inhale into the lungs.     Fluticasone-Umeclidin-Vilant (TRELEGY ELLIPTA) 100-62.5-25 MCG/ACT AEPB Inhale 1 puff into the lungs daily. 180 each 2   furosemide (LASIX) 40 MG tablet Take by mouth.     hydrOXYzine (ATARAX) 10 MG tablet TAKE 1 TABLET BY MOUTH 3 TIMES DAILY AS NEEDED FOR ITCHING. Strength: 10 mg 60 tablet 2   ipratropium-albuterol (DUONEB) 0.5-2.5 (3) MG/3ML SOLN TAKE 3 ML BY NEBULIZATION EVERY 4 HOURS AS NEEDED (WHEEZING). (Patient taking differently: Take 3 mLs by nebulization every 4 (four) hours as needed (for wheezing or COPD symptoms).) 360 mL 0   ketoconazole (NIZORAL) 2 % cream Apply 1 Application topically daily. (Patient taking differently: Apply 1 Application topically daily as needed for irritation.) 120 g 1   KLOR-CON M20 20 MEQ tablet TAKE 1 TABLET BY MOUTH EVERY DAY (Patient taking differently: Take 40 mEq by mouth daily.) 90 tablet 3   lisinopril (ZESTRIL) 5 MG tablet Take by mouth.     losartan (COZAAR) 25 MG tablet Take 1 tablet (25 mg total) by mouth daily. 30 tablet 11   mupirocin ointment (BACTROBAN) 2 % Place 1 Application into the nose 2 (two) times daily. 22 g 0   omeprazole (PRILOSEC) 20 MG capsule TAKE 1 CAPSULE  BY MOUTH TWICE A DAY 180 capsule 1   Omeprazole 20 MG TBDD Take by mouth.     ondansetron (ZOFRAN) 4 MG tablet Take 1 tablet (4 mg total) by mouth every 6 (six) hours as needed for nausea. 40 tablet 0   polyethylene glycol (MIRALAX / GLYCOLAX) 17 g packet Take by mouth.     potassium chloride (KLOR-CON) 20 MEQ packet Take by mouth.     Respiratory Therapy Supplies (FULL KIT NEBULIZER SET) MISC 1 each by Does not apply route as needed. 1 each 0   senna-docusate (SENOKOT-S) 8.6-50 MG tablet Take 1 tablet by mouth at bedtime as needed for mild constipation. 30 tablet 0   sertraline (ZOLOFT) 100 MG tablet Take by mouth.     silver sulfADIAZINE (SILVADENE) 1 % cream APPLY TOPICALLY TO AFFECTED AREA EVERY DAY (Patient taking differently: Apply 1 Application topically daily as needed (to wound sites that won't heal).) 50 g 3   tamsulosin (FLOMAX) 0.4 MG CAPS capsule TAKE 1 CAPSULE BY MOUTH EVERY DAY (Patient taking differently: Take 0.4 mg by mouth every evening.) 90 capsule 3   torsemide (DEMADEX) 20 MG tablet Take 4 tablets (80 mg total) by mouth See admin instructions. Take 80 mg by mouth in the morning and evening; HOLD For a few days and resume next week with close follow up with Dr. [name] (Patient taking differently: Take 80 mg by mouth See admin instructions. Take 80 mg by mouth in the morning and evening; HOLD For a few days and resume next week with close follow up with Dr. [name]..No results found for: ....... 12/18/22: pt only taking 40mg  BID per statement.)     traMADol (ULTRAM) 50 MG tablet TAKE 1 TABLET BY MOUTH EVERY DAY AS NEEDED (Patient taking  differently: Take 25 mg by mouth daily as needed for moderate pain.) 30 tablet 2   traZODone (DESYREL) 100 MG tablet TAKE 2 TABLETS BY MOUTH AT BEDTIME AS NEEDED (Patient taking differently: Take 200 mg by mouth at bedtime.) 180 tablet 1   TUMS 500 MG chewable tablet Chew 500-2,000 mg by mouth every 4 (four) hours as needed for indigestion or heartburn.      venlafaxine XR (EFFEXOR XR) 150 MG 24 hr capsule Take 1 capsule (150 mg total) by mouth daily with breakfast. 90 capsule 3   zolpidem (AMBIEN) 10 MG tablet TAKE 1 TABLET BY MOUTH EVERY DAY AT BEDTIME AS NEEDED 90 tablet 1   predniSONE (DELTASONE) 10 MG tablet TAKE 1 TABLET (10 MG TOTAL) BY MOUTH DAILY WITH BREAKFAST. 30 tablet 0   No facility-administered medications prior to visit.    Review of Systems  Constitutional:  Negative for chills, diaphoresis, fever, malaise/fatigue and weight loss.  HENT:  Positive for congestion.   Respiratory:  Positive for shortness of breath and wheezing. Negative for cough, hemoptysis and sputum production.   Cardiovascular:  Negative for chest pain, palpitations and leg swelling.     Objective:   Vitals:   12/31/22 1323  BP: 118/70  Pulse: 75  SpO2: 93%  Weight: 285 lb (129.3 kg)  Height: 5\' 10"  (1.778 m)   SpO2: 93 % O2 Device: None (Room air)  Body mass index is 40.89 kg/m.  Physical Exam: General: Well-appearing, no acute distress HENT: [place], AT Eyes: EOMI, no scleral icterus Respiratory:Diminished bibasilar bases.  No crackles, wheezing or rales Cardiovascular: RRR, -M/R/G, no JVD Extremities:-Edema,-tenderness Neuro: AAO x4, CNII-XII grossly intact Psych: Normal mood, normal affect  Data Reviewed:  Imaging: CXR 05/05/21 - Cardiomegaly. S/p biventricular ICD. Small bilateral pleural effusion CXR 11/29/22 - Cardiomegaly. S/p biventricular ICD. Resolved effusions  PFT: 01/27/14 FVC 3.35 (72%) FEV1 2.41 (68%) Ratio 65  TLC 94% DLCO 77% Interpretation: Moderate obstructive defect with mildly reduced DLCO. Borderline bronchodilator response in FEV1 present.  Labs: CBC    Component Value Date/Time   WBC 10.1 11/24/2022 0601   RBC 4.10 (L) 11/24/2022 0601   HGB 13.2 11/24/2022 0601   HGB 12.8 (L) 12/19/2017 1532   HCT 41.7 11/24/2022 0601   HCT 38.0 12/19/2017 1532   PLT 285 11/24/2022 0601   PLT 208 12/19/2017 1532   MCV  101.7 (H) 11/24/2022 0601   MCV 99 (H) 12/19/2017 1532   MCH 32.2 11/24/2022 0601   MCHC 31.7 11/24/2022 0601   RDW 12.6 11/24/2022 0601   RDW 14.0 12/19/2017 1532   LYMPHSABS 1.6 11/24/2022 0601   LYMPHSABS 1.9 12/19/2017 1532   MONOABS 1.0 11/24/2022 0601   EOSABS 0.1 11/24/2022 0601   EOSABS 0.3 12/19/2017 1532   BASOSABS 0.0 11/24/2022 0601   BASOSABS 0.0 12/19/2017 1532   Abs eos 12/20/21 -100     Assessment & Plan:   Discussion: 67 year old male former smoker with COPD, childhood asthma, OSA not on CPAP, DM2, HTN, atrial fib on eliquis, NICM s/p AICD, chronic diastolic heart failure who presents for follow-up. Ambulatory O2 with no desaturations. Euvolemic on my exam. Suspect his DOE is related to deconditioning, uncontrolled OSA and COPD exacerbation. Counseled on increased physical activity. Discussed clinical course and management of COPD/asthma including bronchodilator regimen and action plan for exacerbation.  COPD-asthma overlap with exacerbation --Prednisone 40 mg x 5 days --CONTINUE Dupixent as scheduled. Managed by Dermatology for eczema --CONTINUE Trelegy 100 mcg ONE puff ONCE  a day --CONTINUE Albuterol AS NEEDED for shortness of breath or wheezing --START aerobic exercise 30 minutes five days a week --Discussed health eating including heart healthy diet  History of OSA Hypoxemia Last sleep test before 2013 --ORDER home sleep study --ORDER overnight oximetry  Health Maintenance Immunization History  Administered Date(s) Administered   Fluad Quad(high Dose 65+) 06/29/2022   Influenza Inj Mdck Quad With Preservative 07/22/2019   Influenza Split 07/09/2011, 07/23/2012   Influenza Whole 09/16/2006, 08/26/2007, 07/27/2008, 07/26/2009, 07/07/2010   Influenza, High Dose Seasonal PF 06/22/2020, 07/07/2021   Influenza,inj,Quad PF,6+ Mos 06/30/2013, 08/19/2014, 06/27/2015, 07/13/2016, 07/09/2017, 07/18/2018   PFIZER(Purple Top)SARS-COV-2 Vaccination 03/21/2020,  04/11/2020, 11/03/2020   Pneumococcal Conjugate-13 04/29/2017   Pneumococcal Polysaccharide-23 03/29/2010, 07/07/2010, 02/23/2015, 03/17/2021   Td 10/16/2003   Tdap 02/23/2015   CT Lung Screen - not qualified. Quit > 15 years ago  Orders Placed This Encounter  Procedures   Pulse oximetry, overnight    On room air    Standing Status:   Future    Standing Expiration Date:   12/31/2023    Scheduling Instructions:     On room air   Home sleep test    Standing Status:   Future    Standing Expiration Date:   12/31/2023    Order Specific Question:   Where should this test be performed:    Answer:   [facility] - Pulmonary   Meds ordered this encounter  Medications   predniSONE (DELTASONE) 10 MG tablet    Sig: Take 4 tablets (40 mg total) by mouth daily with breakfast for 5 days.    Dispense:  20 tablet    Refill:  0    Return in about 6 weeks (around 02/11/2023) for routine follow-up, with Dr. Loanne Drilling. Will need ambulatory O2 with POC at next visit.  I have spent a total time of 35-minutes on the day of the appointment including chart review, data review, collecting history, coordinating care and discussing medical diagnosis and plan with the patient/family. Past medical history, allergies, medications were reviewed. Pertinent imaging, labs and tests included in this note have been reviewed and interpreted independently by me.  Coleman, MD [facility] 12/31/2022 2:11 PM  Office Number [phone]

## 2022-12-31 NOTE — Patient Instructions (Addendum)
COPD-asthma overlap --Prednisone 40 mg x 5 days --CONTINUE Dupixent as scheduled. Managed by Dermatology for eczema --CONTINUE Trelegy 100 mcg ONE puff ONCE a day --CONTINUE Albuterol AS NEEDED for shortness of breath or wheezing --START aerobic exercise 30 minutes five days a week --Discussed health eating including heart healthy diet  History of OSA Hypoxemia Last sleep test before 2013 --ORDER home sleep study --ORDER overnight oximetry

## 2023-01-01 ENCOUNTER — Ambulatory Visit (HOSPITAL_COMMUNITY): Payer: Medicare HMO

## 2023-01-02 ENCOUNTER — Ambulatory Visit (HOSPITAL_COMMUNITY): Admission: RE | Admit: 2023-01-02 | Payer: Medicare HMO | Source: Ambulatory Visit

## 2023-01-03 ENCOUNTER — Telehealth: Payer: Self-pay

## 2023-01-03 ENCOUNTER — Telehealth (HOSPITAL_COMMUNITY): Payer: Self-pay

## 2023-01-03 ENCOUNTER — Ambulatory Visit (HOSPITAL_COMMUNITY): Payer: Medicare HMO

## 2023-01-03 ENCOUNTER — Telehealth (HOSPITAL_COMMUNITY): Payer: Self-pay | Admitting: Emergency Medicine

## 2023-01-03 ENCOUNTER — Other Ambulatory Visit (HOSPITAL_COMMUNITY): Payer: Self-pay | Admitting: Emergency Medicine

## 2023-01-03 NOTE — Telephone Encounter (Signed)
Spoke with pt and advised of xray report per Dr [name].  Pt verbalizes understanding and states he will follow up with his PCP.  Pt thanked Therapist, sports for the call.

## 2023-01-03 NOTE — Telephone Encounter (Signed)
Patient is up 6 lbs in weight in one day, 140/100 BP, Pulse is 70, O2 is 95%. He cannot walk very far without getting very short of breath. No chest pain. Has no energy. Dede just got to his house. Any changes to be made? Please advise?

## 2023-01-03 NOTE — Telephone Encounter (Signed)
Increase lasix to 40 mg bid x 2 days then back to 40 mg daily. If not improved w/ diuretic increase can call back and arrange f/u w/ APP.

## 2023-01-03 NOTE — Progress Notes (Signed)
Paramedicine Encounter    Patient ID: [name], male    DOB: October 07, 1956, 67 y.o.   MRN: [id number]   Complaints Exertional SOB, lethargic  Assessment Obvious SOB especially w/ exertion.  Lung sounds clear, pitting edema to lower extremities, +JVD  Compliance with meds 100%  Pill box filled takes bottles   Refills needed   Meds changes since last visit Prednisone 10mg . 4 tablets daily x 5 days.  Has two days left of same to complete regimen    Social changes NONE   BP (!) 140/100 (BP Location: Left Arm, Patient Position: Sitting, Cuff Size: Normal)   Pulse 70   Resp 17   Wt 277 lb (125.6 kg)   SpO2 95%   BMI 39.75 kg/m  Weight yesterday-277lb Last visit weight-283.6lb  ATF Mr. Klemann A&O x 4, skin W&D w/ good color.  Pt. Obviously SOB and states he more SOB especially with exertion than usual.  Weight up 6 lbs since yesterday.  Pitting edema to lower extremities.+JVD.  He says he has a full sensation which he says is normal when he's retaining fluid.   No wheezing noted.  He speaks in slightly broken sentences after exertion but after he sits for a while his respiratory effort returns to a more normal level.   Since I've been with him this visit (25 minutes) he has had to void 3 times.   I immediately contacted HF triage for possible med changes and will wait for a return call.   ACTION: Home visit completed  Skipper Cliche [phone] 01/04/23  Patient Care Team: [name], MD as PCP - General (Internal Medicine) Larey Dresser, MD as PCP - Advanced Heart Failure (Cardiology) Deboraha Sprang, MD as PCP - Electrophysiology (Cardiology) Himmelrich, Bryson Ha, RD (Inactive) as Dietitian Deboraha Sprang, MD as Consulting Physician (Cardiology) Mcarthur Rossetti, MD as Consulting Physician (Orthopedic Surgery) Ruthy Dick, MD as Referring Physician (Ophthalmology)  Patient Active Problem List   Diagnosis Date Noted   Cellulitis and  abscess of leg, except foot 11/21/2022   Cellulitis of right lower extremity without foot 11/20/2022   Anxiety and depression 05/20/2022   AC (acromioclavicular) arthritis 04/24/2022   Cataract, nuclear sclerotic, left eye 03/07/2022   ED (erectile dysfunction) 01/03/2022   Intertrigo 12/24/2021   Gluteal tendinitis of left buttock 12/19/2021   Unilateral primary osteoarthritis, left knee 10/17/2021   Status post total knee replacement, left 10/17/2021   Rib pain on right side 10/10/2021   History of colonic polyps 09/26/2021   Bursitis of left hip 06/13/2021   Tooth fracture 06/11/2021   Pain and swelling of right lower leg 05/18/2021   Gastrointestinal hemorrhage    Diverticulitis of colon    Preop exam for internal medicine 03/04/2021   B12 deficiency 03/04/2021   Vitamin D deficiency 03/01/2021   Myalgia 12/01/2020   Laceration of left leg 09/17/2020   Statin myopathy 08/02/2020   Rotator cuff arthropathy of both shoulders 02/23/2020   (HFpEF) heart failure with preserved ejection fraction ([place]) 02/22/2020   ICD (implantable cardioverter-defibrillator) in place 12/27/2019   Elevated PSA 09/23/2019   Lip lesion 12/23/2018   Diabetic ulcer of left lower leg with necrosis of muscle ([place]) 12/05/2018   Chronic bursitis of left shoulder 07/01/2018   Pain due to total knee replacement ([place]) 03/31/2018   Rotator cuff arthropathy, left 02/13/2018   Right rotator cuff tear arthropathy 01/14/2018   Increased prostate specific antigen (PSA) velocity 09/26/2017  HFrEF (heart failure with reduced ejection fraction) ([place]) 09/18/2017   Trigger point of thoracic region 07/04/2017   Right lumbar radiculopathy 05/15/2017   Right leg swelling 05/09/2017   Glaucoma 04/18/2017   History of nasal polyposis 02/19/2017   Chronic rhinitis 02/19/2017   Microhematuria 12/04/2016   Hyperlipidemia 08/15/2016   Encounter for well adult exam with abnormal findings 11/29/2015   Syncope 03/30/2015    Former tobacco use 02/25/2015   Type 2 diabetes mellitus ([place]) 02/25/2015   Permanent atrial fibrillation ([place]) 02/25/2015   Anemia 02/23/2015   Thyrotoxicosis 02/23/2015   Hx of adenomatous colonic polyps 10/21/2014   De Quervain's tenosynovitis, right 04/30/2014   Osteoarthritis of right knee 02/19/2014   Encounter for therapeutic drug monitoring 11/17/2013   Thoracic degenerative disc disease 06/17/2013   Thoracic spondylosis without myelopathy 02/11/2013   Lumbosacral spondylosis without myelopathy 02/11/2013   Degenerative joint disease of knee, left 02/11/2013   COPD (chronic obstructive pulmonary disease) ([place]) 08/20/2012   Bundle branch block, right and extreme left anterior fascicular 05/05/2012   Nonischemic cardiomyopathy ([place]) 05/02/2012   Anticoagulated on warfarin 03/21/2012   Spongiotic dermatitis 09/26/2011   Past myocardial infarction 04/19/2011   UNSPECIFIED URETHRAL STRICTURE 12/26/2010   Chronic pain syndrome 05/18/2010   OSA (obstructive sleep apnea) 10/21/2007   NEPHROLITHIASIS, HX OF 10/21/2007   GERD (gastroesophageal reflux disease) 06/09/2007   Benign prostatic hyperplasia 06/09/2007   Morbid obesity ([place]) 06/03/2007   Depression 06/03/2007   Essential hypertension 06/03/2007   INSOMNIA 06/03/2007    Current Outpatient Medications:    acetaminophen (TYLENOL) 500 MG tablet, Take 500-1,000 mg by mouth every 6 (six) hours as needed for mild pain or headache., Disp: , Rfl:    albuterol (VENTOLIN HFA) 108 (90 Base) MCG/ACT inhaler, INHALE 2 PUFFS BY MOUTH 4 TIMES A DAY AS NEEDED, Disp: 54 each, Rfl: 3   ALPRAZolam (XANAX) 0.5 MG tablet, TAKE 2 TABLETS (1 MG TOTAL) BY MOUTH EVERY DAY AT BEDTIME, Disp: 60 tablet, Rfl: 5   ammonium lactate (AMLACTIN) 12 % cream, Apply 1 Application topically as needed for dry skin., Disp: 385 g, Rfl: 3   Aspirin-Acetaminophen-Caffeine (GOODY HEADACHE PO), Take 1 packet by mouth every 6 (six) hours as needed (for pain)., Disp: ,  Rfl:    augmented betamethasone dipropionate (DIPROLENE-AF) 0.05 % cream, Apply 1 application  topically daily as needed (to affected areas- for itching)., Disp: , Rfl:    carisoprodol (SOMA) 350 MG tablet, TAKE 1 TABLET BY MOUTH THREE TIMES A DAY AS NEEDED FOR MUSCLE SPASM, Disp: 90 tablet, Rfl: 2   carvedilol (COREG) 6.25 MG tablet, TAKE 1 TABLET BY MOUTH TWICE A DAY, Disp: 180 tablet, Rfl: 3   Cholecalciferol (VITAMIN D3) 1000 units CAPS, Take 1,000 Units by mouth daily., Disp: , Rfl:    Cholecalciferol 100 MCG (4000 UT) CAPS, 1 tab by mouth once daily, Disp: 30 capsule, Rfl: 99   clobetasol cream (TEMOVATE) 0.05 %, Apply topically., Disp: , Rfl:    dapagliflozin propanediol (FARXIGA) 10 MG TABS tablet, Take 1 tablet (10 mg total) by mouth daily before breakfast., Disp: 30 tablet, Rfl: 8   digoxin (LANOXIN) 0.125 MG tablet, Take by mouth., Disp: , Rfl:    diltiazem (CARDIZEM CD) 120 MG 24 hr capsule, Take by mouth., Disp: , Rfl:    doxepin (SINEQUAN) 10 MG capsule, TAKE 2 CAPSULES (20 MG TOTAL) BY MOUTH AT BEDTIME AS NEEDED. (Patient taking differently: Take 20 mg by mouth at bedtime.), Disp: 180 capsule, Rfl:  1   DULoxetine (CYMBALTA) 60 MG capsule, Take 60 mg by mouth daily., Disp: , Rfl:    ELIQUIS 5 MG TABS tablet, TAKE 1 TABLET BY MOUTH TWICE A DAY (Patient taking differently: Take 5 mg by mouth in the morning.), Disp: 60 tablet, Rfl: 11   eplerenone (INSPRA) 25 MG tablet, Take 1 tablet (25 mg total) by mouth daily., Disp: 30 tablet, Rfl: 8   Evolocumab (REPATHA SURECLICK) [id number] MG/ML SOAJ, INJECT 1 PEN INTO THE SKIN EVERY 14 (FOURTEEN) DAYS. (Patient taking differently: Inject 140 mg into the skin every 14 (fourteen) days.), Disp: 6 mL, Rfl: 3   fluocinonide cream (LIDEX) 0.05 %, Apply topically., Disp: , Rfl:    fluticasone (FLONASE) 50 MCG/ACT nasal spray, PLACE 2 SPRAYS INTO BOTH NOSTRILS DAILY AS NEEDED FOR ALLERGIES OR RHINITIS., Disp: 48 mL, Rfl: 1   Fluticasone-Umeclidin-Vilant  (TRELEGY ELLIPTA) 100-62.5-25 MCG/ACT AEPB, Inhale 1 puff into the lungs daily., Disp: 180 each, Rfl: 2   furosemide (LASIX) 40 MG tablet, Take by mouth., Disp: , Rfl:    hydrOXYzine (ATARAX) 10 MG tablet, TAKE 1 TABLET BY MOUTH 3 TIMES DAILY AS NEEDED FOR ITCHING. Strength: 10 mg, Disp: 60 tablet, Rfl: 2   ipratropium-albuterol (DUONEB) 0.5-2.5 (3) MG/3ML SOLN, TAKE 3 ML BY NEBULIZATION EVERY 4 HOURS AS NEEDED (WHEEZING). (Patient taking differently: Take 3 mLs by nebulization every 4 (four) hours as needed (for wheezing or COPD symptoms).), Disp: 360 mL, Rfl: 0   ketoconazole (NIZORAL) 2 % cream, Apply 1 Application topically daily. (Patient taking differently: Apply 1 Application topically daily as needed for irritation.), Disp: 120 g, Rfl: 1   lisinopril (ZESTRIL) 5 MG tablet, Take by mouth., Disp: , Rfl:    losartan (COZAAR) 25 MG tablet, Take 1 tablet (25 mg total) by mouth daily., Disp: 30 tablet, Rfl: 11   omeprazole (PRILOSEC) 20 MG capsule, TAKE 1 CAPSULE BY MOUTH TWICE A DAY, Disp: 180 capsule, Rfl: 1   polyethylene glycol (MIRALAX / GLYCOLAX) 17 g packet, Take by mouth., Disp: , Rfl:    torsemide (DEMADEX) 20 MG tablet, Take 4 tablets (80 mg total) by mouth See admin instructions. Take 80 mg by mouth in the morning and evening; HOLD For a few days and resume next week with close follow up with Dr. [name], Disp: , Rfl:    traMADol (ULTRAM) 50 MG tablet, TAKE 1 TABLET BY MOUTH EVERY DAY AS NEEDED (Patient taking differently: Take 25 mg by mouth daily as needed for moderate pain.), Disp: 30 tablet, Rfl: 2   traZODone (DESYREL) 100 MG tablet, TAKE 2 TABLETS BY MOUTH AT BEDTIME AS NEEDED (Patient taking differently: Take 200 mg by mouth at bedtime.), Disp: 180 tablet, Rfl: 1   TUMS 500 MG chewable tablet, Chew 500-2,000 mg by mouth every 4 (four) hours as needed for indigestion or heartburn., Disp: , Rfl:    venlafaxine XR (EFFEXOR XR) 150 MG 24 hr capsule, Take 1 capsule (150 mg total) by mouth  daily with breakfast., Disp: 90 capsule, Rfl: 3   zolpidem (AMBIEN) 10 MG tablet, TAKE 1 TABLET BY MOUTH EVERY DAY AT BEDTIME AS NEEDED, Disp: 90 tablet, Rfl: 1   Dupilumab (DUPIXENT) 300 MG/2ML SOPN, Inject 300 mg into the skin every 14 (fourteen) days. (Patient not taking: Reported on 01/03/2023), Disp: , Rfl:    fluticasone-salmeterol (ADVAIR DISKUS) 250-50 MCG/ACT AEPB, Inhale into the lungs. (Patient not taking: Reported on 01/03/2023), Disp: , Rfl:    KLOR-CON M20 20 MEQ tablet, TAKE 1  TABLET BY MOUTH EVERY DAY (Patient taking differently: Take 40 mEq by mouth daily.), Disp: 90 tablet, Rfl: 3   mupirocin ointment (BACTROBAN) 2 %, Place 1 Application into the nose 2 (two) times daily. (Patient not taking: Reported on 01/03/2023), Disp: 22 g, Rfl: 0   Omeprazole 20 MG TBDD, Take by mouth., Disp: , Rfl:    ondansetron (ZOFRAN) 4 MG tablet, Take 1 tablet (4 mg total) by mouth every 6 (six) hours as needed for nausea., Disp: 40 tablet, Rfl: 0   potassium chloride (KLOR-CON) 20 MEQ packet, Take by mouth., Disp: , Rfl:    predniSONE (DELTASONE) 10 MG tablet, Take 4 tablets (40 mg total) by mouth daily with breakfast for 5 days., Disp: 20 tablet, Rfl: 0   Respiratory Therapy Supplies (FULL KIT NEBULIZER SET) MISC, 1 each by Does not apply route as needed., Disp: 1 each, Rfl: 0   senna-docusate (SENOKOT-S) 8.6-50 MG tablet, Take 1 tablet by mouth at bedtime as needed for mild constipation., Disp: 30 tablet, Rfl: 0   sertraline (ZOLOFT) 100 MG tablet, Take by mouth., Disp: , Rfl:    silver sulfADIAZINE (SILVADENE) 1 % cream, APPLY TOPICALLY TO AFFECTED AREA EVERY DAY (Patient taking differently: Apply 1 Application topically daily as needed (to wound sites that won't heal).), Disp: 50 g, Rfl: 3   tamsulosin (FLOMAX) 0.4 MG CAPS capsule, TAKE 1 CAPSULE BY MOUTH EVERY DAY (Patient taking differently: Take 0.4 mg by mouth every evening.), Disp: 90 capsule, Rfl: 3 Allergies  Allergen Reactions   Tape Other  (See Comments)    SKIN TEARS VERY, VERY EASILY!!!! Use Paper Tape Only, PLEASE!!!   Amiodarone Other (See Comments)    Caused hyperthyroidism    Statins Other (See Comments)    Myalgias    Wound Dressing Adhesive Other (See Comments)    Skin Tears Use Paper Tape Only     Social History   Socioeconomic History   Marital status: Divorced    Spouse name: Not on file   Number of children: 2   Years of education: Not on file   Highest education level: Not on file  Occupational History   Occupation: retired/disabled. prev worked in Therapist, sports.    Employer: DISABLED  Tobacco Use   Smoking status: Former    Packs/day: 2.00    Years: 30.00    Additional pack years: 0.00    Total pack years: 60.00    Types: Cigarettes    Quit date: 10/16/2007    Years since quitting: 15.2   Smokeless tobacco: Never  Vaping Use   Vaping Use: Never used  Substance and Sexual Activity   Alcohol use: No   Drug use: No   Sexual activity: Not Currently  Other Topics Concern   Not on file  Social History Narrative   Lives alone.   Social Determinants of Health   Financial Resource Strain: Low Risk  (06/29/2022)   Overall Financial Resource Strain (CARDIA)    Difficulty of Paying Living Expenses: Not hard at all  Food Insecurity: No Food Insecurity (11/26/2022)   Hunger Vital Sign    Worried About Running Out of Food in the Last Year: Never true    Ran Out of Food in the Last Year: Never true  Transportation Needs: No Transportation Needs (11/26/2022)   PRAPARE - Hydrologist (Medical): No    Lack of Transportation (Non-Medical): No  Physical Activity: Inactive (06/29/2022)   Exercise Vital Sign  Days of Exercise per Week: 0 days    Minutes of Exercise per Session: 0 min  Stress: No Stress Concern Present (06/29/2022)   [place]    Feeling of Stress : Not at all  Social Connections:  Moderately Integrated (06/29/2022)   Social Connection and Isolation Panel [NHANES]    Frequency of Communication with Friends and Family: More than three times a week    Frequency of Social Gatherings with Friends and Family: More than three times a week    Attends Religious Services: More than 4 times per year    Active Member of Clubs or Organizations: Yes    Attends Music therapist: More than 4 times per year    Marital Status: Divorced  Intimate Partner Violence: Not At Risk (11/22/2022)   Humiliation, Afraid, Rape, and Kick questionnaire    Fear of Current or Ex-Partner: No    Emotionally Abused: No    Physically Abused: No    Sexually Abused: No    Physical Exam      Future Appointments  Date Time Provider [place]  01/04/2023  2:00 PM [facility] [place] [facility]  01/17/2023 12:00 PM [facility] PA/NP [facility] None  01/18/2023  1:15 PM Lyndal Pulley, DO [facility] None  01/21/2023  7:20 AM [facility] DEVICE REMOTES [facility]  01/23/2023  9:05 AM [facility] DEVICE REMOTES [facility]  02/13/2023  1:45 PM Margaretha Seeds, MD [facility]  03/05/2023  1:45 PM Criselda Peaches, DPM [facility]  03/12/2023  1:40 PM Larey Dresser, MD [facility] None  04/24/2023  9:05 AM [facility] DEVICE REMOTES [facility]  07/01/2023  1:20 PM [name], MD [facility] None  07/24/2023  9:05 AM [facility] DEVICE REMOTES [facility]  10/23/2023  9:05 AM [facility] DEVICE REMOTES [facility]  01/22/2024  9:05 AM [facility] DEVICE REMOTES [facility]  04/22/2024  9:05 AM [facility] DEVICE REMOTES [facility]

## 2023-01-03 NOTE — Telephone Encounter (Signed)
Mr. Levee called to confirm home visit today- confirmed for 2:00.    Renee Ramus, [place] 01/03/2023

## 2023-01-03 NOTE — Telephone Encounter (Signed)
I spoke to DeDe and updated her on the Torsemide to go back to 80mg  twice daily, per [facility].

## 2023-01-03 NOTE — Telephone Encounter (Signed)
-----   Message from Emily Filbert, RN sent at 01/01/2023  8:46 AM EDT -----  ----- Message ----- From: Deboraha Sprang, MD Sent: 12/29/2022  10:41 AM EDT To: Emily Filbert, RN  Please Inform Patient that -[name]   is abnormal demonstrating a "minimally displaced fracture in the left lower rib Thanks

## 2023-01-04 ENCOUNTER — Telehealth (HOSPITAL_COMMUNITY): Payer: Self-pay | Admitting: Emergency Medicine

## 2023-01-04 ENCOUNTER — Encounter (HOSPITAL_BASED_OUTPATIENT_CLINIC_OR_DEPARTMENT_OTHER): Payer: Medicare HMO

## 2023-01-04 NOTE — Telephone Encounter (Signed)
LVM to confirm with pt to increase Potassium to 40 mEq daily.    Renee Ramus, [place] 01/04/2023

## 2023-01-04 NOTE — Telephone Encounter (Signed)
Called Mr. Lessman to advise med change.  He needs to take Torsemide 80mg . BID.  He verbalizes that he understands same.    Renee Ramus, [place] 01/04/2023

## 2023-01-04 NOTE — Telephone Encounter (Signed)
Called and spoke directly with [name].  Confirmed with him he is to take 74mEq Potassium daily.  He verbalizes that understands same.     Renee Ramus, [place] 01/04/2023

## 2023-01-08 ENCOUNTER — Telehealth (HOSPITAL_COMMUNITY): Payer: Self-pay

## 2023-01-08 ENCOUNTER — Telehealth (HOSPITAL_COMMUNITY): Payer: Self-pay | Admitting: Licensed Clinical Social Worker

## 2023-01-08 ENCOUNTER — Ambulatory Visit (HOSPITAL_COMMUNITY): Payer: Medicare HMO

## 2023-01-08 ENCOUNTER — Encounter (HOSPITAL_COMMUNITY): Payer: Medicare HMO

## 2023-01-08 ENCOUNTER — Other Ambulatory Visit (HOSPITAL_COMMUNITY): Payer: Self-pay | Admitting: Pharmacist

## 2023-01-08 ENCOUNTER — Telehealth (HOSPITAL_COMMUNITY): Payer: Self-pay | Admitting: Pharmacy Technician

## 2023-01-08 NOTE — Telephone Encounter (Signed)
Left message for Mr. Kitchell to return my call to follow up on med changes from last week and notifying him of Dede's absence today and if he had any needs to reach back out. Call complete.   [name], [place] 01/08/2023

## 2023-01-08 NOTE — Telephone Encounter (Signed)
CSW consulted by patient advocate to assist with applying for Extra Help program.  CSW called pt to discuss- unable to reach- left VM requesting return call  Jorge Ny, [facility] Desk#: [phone] Cell#: [phone]

## 2023-01-08 NOTE — Telephone Encounter (Signed)
Message to pharmacy team to assist

## 2023-01-08 NOTE — Telephone Encounter (Signed)
Advanced Heart Failure Patient Advocate Encounter  The patient was approved for a [employer] grant that will help cover the cost of Farxiga, Coreg, Digoxin, Inspra, Losartan. Total amount awarded, $10,000. Eligibility, 12/09/22 - 12/09/23.  ID [id number]  BIN [id number]  PCN [id number]  Group [id number]  Emailed patient copy of information.

## 2023-01-08 NOTE — Telephone Encounter (Signed)
H&V Care Navigation CSW Progress Note  Clinical Social Worker received call back from pt regarding Extra Help program application.  CSW assisted pt in applying for program should receive notice in 3-4 weeks regarding eligibility.   SDOH Screenings   Food Insecurity: No Food Insecurity (11/26/2022)  Housing: Low Risk  (11/22/2022)  Recent Concern: Housing - Medium Risk (09/10/2022)  Transportation Needs: No Transportation Needs (11/26/2022)  Utilities: Not At Risk (11/22/2022)  Alcohol Screen: Low Risk  (06/29/2022)  Depression (PHQ2-9): Low Risk  (12/28/2022)  Financial Resource Strain: Low Risk  (06/29/2022)  Physical Activity: Inactive (06/29/2022)  Social Connections: Moderately Integrated (06/29/2022)  Stress: No Stress Concern Present (06/29/2022)  Tobacco Use: Medium Risk (12/31/2022)   Jorge Ny, LCSW Clinical Social Worker Advanced [facility] Failure Clinic Desk#: [phone] Cell#: [phone]

## 2023-01-08 NOTE — Telephone Encounter (Signed)
Mr. Tureaud returned my call and states he is feeling much better- his weight is down 6lbs and he says he is no longer short of breath. He does report having issues with med cost for his Trelegy and [place]. I was able to send HF team a message for [facility] patient assistance and will continue to follow up. I recommended he reach out to [id number] about trelegy samples and having them assist with patient assistance for trelegy. He agreed with plan and sees them next week. Call complete.   [name], [place] 01/08/2023

## 2023-01-08 NOTE — Telephone Encounter (Signed)
Talked to [name] today and he reports that his [place] is very expensive and he is needing some patient assistance for this. He was given samples in clinic a few weeks ago but reports his pharmacy is saying his copay is over $200. Fowarding this to HF team for assistance.   [name], [place] 01/08/2023

## 2023-01-10 ENCOUNTER — Ambulatory Visit (HOSPITAL_COMMUNITY): Payer: Medicare HMO

## 2023-01-10 NOTE — Progress Notes (Signed)
[name] [address] [place] [address] Phone: [phone] Subjective:   [name], am serving as a scribe for Dr. Antoine Primas.  I'm seeing this patient by the request  of:  Zachary Levins, MD  CC: Bilateral shoulder pain  YIF:[id number]  11/29/2022 Chronic problem with worsening symptoms. Does have end-stage osteoarthritic changes of the shoulders bilaterally. Due to patient's other chronic illnesses difficult to be treated. Patient wants to hold on any type of surgical intervention. Can repeat every 10 weeks   Updated 01/18/2023 [name] is a 67 y.o. male coming in with complaint of B shoulder pain. Patient states that he is in need of injections for the shoulders. Feels weak.   Xray IMPRESSION: Cardiomegaly without acute disease.     Past Medical History:  Diagnosis Date   AICD (automatic cardioverter/defibrillator) present    Anemia    supposed to be taking Vit B but doesn't   ANXIETY    takes Xanax nightly   Arthritis    Asthma    Albuterol prn and Advair daily;also takes Prednisone daily   Cardiomyopathy    a. EF 25% TEE July 2013; b. EF normalized 2015;  c. 03/2015 Echo: EF 40-45%, difrf HK, PASP 38 mmHg, Mild MR, sev LAE/RAE.   Chronic constipation    takes OTC stool softener   COPD (chronic obstructive pulmonary disease)    "one dr says COPD; one dr says emphysema" (09/18/2017)   COVID-19 12/09/2019   DEPRESSION    takes Zoloft and Doxepin daily   Diverticulitis    DYSKINESIA, ESOPHAGUS    Essential hypertension        FIBROMYALGIA    GERD (gastroesophageal reflux disease)        Glaucoma    HYPERLIPIDEMIA    a. Intolerant to statins.   INSOMNIA    takes Ambien nightly   Myocardial infarction    a. 2012 Myoview notable for prior infarct;  b. 03/2015 Lexiscan CL: EF 37%, diff HK, small area of inferior infarct from apex to base-->Med Rx.   O2 dependent    "2.5L q hs & prn" (09/18/2017)   Paroxysmal  atrial fibrillation    a. CHA2DS2VASc = 3--> takes Coumadin;  b. 03/15/2015 Successful TEE/DCCV;  c. 03/2015 recurrent afib, Amio d/c'd in setting of hyperthyroidism.   Peripheral neuropathy    Pneumonia 12/2016   Rash and other nonspecific skin eruption 04/12/2009   no cause found saw dermatologists x 2 and allergist   SLEEP APNEA, OBSTRUCTIVE    a. doesn't use CPAP   Syncope    a. 03/2015 s/p MDT LINQ.   Type II diabetes mellitus        Past Surgical History:  Procedure Laterality Date   ACNE CYST REMOVAL     2 on back    AV NODE ABLATION N/A 10/25/2017   Procedure: AV NODE ABLATION;  Surgeon: Duke Salvia, MD;  Location: [facility] INVASIVE CV LAB;  Service: Cardiovascular;  Laterality: N/A;   BIV ICD INSERTION CRT-D N/A 09/18/2017   Procedure: BIV ICD INSERTION CRT-D;  Surgeon: Duke Salvia, MD;  Location: [facility] INVASIVE CV LAB;  Service: Cardiovascular;  Laterality: N/A;   CARDIAC CATHETERIZATION N/A 03/21/2016   Procedure: Right/Left Heart Cath and Coronary Angiography;  Surgeon: Laurey Morale, MD;  Location: [facility] INVASIVE CV LAB;  Service: Cardiovascular;  Laterality: N/A;   CARDIOVERSION  04/18/2012   Procedure: CARDIOVERSION;  Surgeon: Pricilla Riffle, MD;  Location: [facility] OR;  Service: Cardiovascular;  Laterality: N/A;   CARDIOVERSION  04/25/2012   Procedure: CARDIOVERSION;  Surgeon: Vesta Mixer, MD;  Location: [facility] ENDOSCOPY;  Service: Cardiovascular;  Laterality: N/A;   CARDIOVERSION  04/25/2012   Procedure: CARDIOVERSION;  Surgeon: Pricilla Riffle, MD;  Location: [facility] OR;  Service: Cardiovascular;  Laterality: N/A;   CARDIOVERSION  05/09/2012   Procedure: CARDIOVERSION;  Surgeon: Tonny Bollman, MD;  Location: [facility] OR;  Service: Cardiovascular;  Laterality: N/A;  changed from crenshaw to cooper by trish/leone-endo   CARDIOVERSION N/A 03/15/2015   Procedure: CARDIOVERSION;  Surgeon: Vesta Mixer, MD;  Location: [facility] ENDOSCOPY;  Service: Cardiovascular;  Laterality: N/A;   COLONOSCOPY      COLONOSCOPY WITH PROPOFOL N/A 10/21/2014   Procedure: COLONOSCOPY WITH PROPOFOL;  Surgeon: Meryl Dare, MD;  Location: [facility] ENDOSCOPY;  Service: Endoscopy;  Laterality: N/A;   EP IMPLANTABLE DEVICE N/A 04/06/2015   Procedure: Loop Recorder Insertion;  Surgeon: Marinus Maw, MD;  Location: [facility] INVASIVE CV LAB;  Service: Cardiovascular;  Laterality: N/A;   ESOPHAGOGASTRODUODENOSCOPY     JOINT REPLACEMENT     LOOP RECORDER REMOVAL N/A 09/18/2017   Procedure: LOOP RECORDER REMOVAL;  Surgeon: Duke Salvia, MD;  Location: [facility] INVASIVE CV LAB;  Service: Cardiovascular;  Laterality: N/A;   RIGHT/LEFT HEART CATH AND CORONARY ANGIOGRAPHY N/A 01/28/2017   Procedure: Right/Left Heart Cath and Coronary Angiography;  Surgeon: Laurey Morale, MD;  Location: [facility] INVASIVE CV LAB;  Service: Cardiovascular;  Laterality: N/A;   TEE WITHOUT CARDIOVERSION  04/25/2012   Procedure: TRANSESOPHAGEAL ECHOCARDIOGRAM (TEE);  Surgeon: Vesta Mixer, MD;  Location: [facility] ENDOSCOPY;  Service: Cardiovascular;  Laterality: N/A;   TEE WITHOUT CARDIOVERSION N/A 03/15/2015   Procedure: TRANSESOPHAGEAL ECHOCARDIOGRAM (TEE);  Surgeon: Vesta Mixer, MD;  Location: [facility] ENDOSCOPY;  Service: Cardiovascular;  Laterality: N/A;   TONSILLECTOMY AND ADENOIDECTOMY     TOTAL KNEE ARTHROPLASTY Right 06/15/2014   Procedure: TOTAL KNEE ARTHROPLASTY;  Surgeon: Sheral Apley, MD;  Location: [facility] OR;  Service: Orthopedics;  Laterality: Right;   TOTAL KNEE ARTHROPLASTY Left 10/17/2021   Procedure: Left TOTAL KNEE ARTHROPLASTY;  Surgeon: Kathryne Hitch, MD;  Location: [facility] OR;  Service: Orthopedics;  Laterality: Left;   Social History   Socioeconomic History   Marital status: Divorced    Spouse name: Not on file   Number of children: 2   Years of education: Not on file   Highest education level: Not on file  Occupational History   Occupation: retired/disabled. prev worked in Teacher, English as a foreign language.    Employer: DISABLED  Tobacco Use   Smoking  status: Former    Packs/day: 2.00    Years: 30.00    Additional pack years: 0.00    Total pack years: 60.00    Types: Cigarettes    Quit date: 10/16/2007    Years since quitting: 15.2   Smokeless tobacco: Never  Vaping Use   Vaping Use: Never used  Substance and Sexual Activity   Alcohol use: No   Drug use: No   Sexual activity: Not Currently  Other Topics Concern   Not on file  Social History Narrative   Lives alone.   Social Determinants of Health   Financial Resource Strain: Low Risk  (06/29/2022)   Overall Financial Resource Strain (CARDIA)    Difficulty of Paying Living Expenses: Not hard at all  Food Insecurity: No Food Insecurity (11/26/2022)   Hunger Vital Sign    Worried About Running  Out of Food in the Last Year: Never true    Ran Out of Food in the Last Year: Never true  Transportation Needs: No Transportation Needs (11/26/2022)   PRAPARE - Administrator, Civil Service (Medical): No    Lack of Transportation (Non-Medical): No  Physical Activity: Inactive (06/29/2022)   Exercise Vital Sign    Days of Exercise per Week: 0 days    Minutes of Exercise per Session: 0 min  Stress: No Stress Concern Present (06/29/2022)   [employer] of Occupational Health - Occupational Stress Questionnaire    Feeling of Stress : Not at all  Social Connections: Moderately Integrated (06/29/2022)   Social Connection and Isolation Panel [NHANES]    Frequency of Communication with Friends and Family: More than three times a week    Frequency of Social Gatherings with Friends and Family: More than three times a week    Attends Religious Services: More than 4 times per year    Active Member of [employer] or Organizations: Yes    Attends Engineer, structural: More than 4 times per year    Marital Status: Divorced   Allergies  Allergen Reactions   Tape Other (See Comments)    SKIN TEARS VERY, VERY EASILY!!!! Use Paper Tape Only, PLEASE!!!   Amiodarone Other (See  Comments)    Caused hyperthyroidism    Statins Other (See Comments)    Myalgias    Wound Dressing Adhesive Other (See Comments)    Skin Tears Use Paper Tape Only   Family History  Problem Relation Age of Onset   COPD Mother    Asthma Mother    Colon polyps Mother    Allergies Mother    Hypothyroidism Mother    Asthma Maternal Grandmother    Colon cancer Neg Hx     Current Outpatient Medications (Endocrine & Metabolic):    predniSONE (DELTASONE) 10 MG tablet, Take 1 tablet (10 mg total) by mouth daily with breakfast.  Current Outpatient Medications (Cardiovascular):    carvedilol (COREG) 6.25 MG tablet, TAKE 1 TABLET BY MOUTH TWICE A DAY   digoxin (LANOXIN) 0.125 MG tablet, Take 1 tablet by mouth daily.   eplerenone (INSPRA) 25 MG tablet, Take 1 tablet (25 mg total) by mouth daily.   Evolocumab (REPATHA SURECLICK) 140 MG/ML SOAJ, INJECT 1 PEN INTO THE SKIN EVERY 14 (FOURTEEN) DAYS.   losartan (COZAAR) 25 MG tablet, Take 1 tablet (25 mg total) by mouth daily.   torsemide (DEMADEX) 20 MG tablet, Take 5 tablets (100 mg total) by mouth every morning AND 4 tablets (80 mg total) every evening.  Current Outpatient Medications (Respiratory):    albuterol (VENTOLIN HFA) 108 (90 Base) MCG/ACT inhaler, INHALE 2 PUFFS BY MOUTH 4 TIMES A DAY AS NEEDED   fluticasone (FLONASE) 50 MCG/ACT nasal spray, PLACE 2 SPRAYS INTO BOTH NOSTRILS DAILY AS NEEDED FOR ALLERGIES OR RHINITIS.   Fluticasone-Umeclidin-Vilant (TRELEGY ELLIPTA) 100-62.5-25 MCG/ACT AEPB, Inhale 1 puff into the lungs daily. (Patient taking differently: Inhale 1 puff into the lungs daily. As needed)   ipratropium-albuterol (DUONEB) 0.5-2.5 (3) MG/3ML SOLN, TAKE 3 ML BY NEBULIZATION EVERY 4 HOURS AS NEEDED (WHEEZING). (Patient taking differently: Take 3 mLs by nebulization every 4 (four) hours as needed (for wheezing or COPD symptoms).)   promethazine (PHENERGAN) 25 MG tablet, Take 1 tablet (25 mg total) by mouth every 8 (eight) hours as  needed for nausea or vomiting.  Current Outpatient Medications (Analgesics):    acetaminophen (TYLENOL) 500 MG  tablet, Take 500-1,000 mg by mouth every 6 (six) hours as needed for mild pain or headache.   Aspirin-Acetaminophen-Caffeine (GOODY HEADACHE PO), Take 1 packet by mouth every 6 (six) hours as needed (for pain).   traMADol (ULTRAM) 50 MG tablet, TAKE 1 TABLET BY MOUTH EVERY DAY AS NEEDED (Patient taking differently: Take 25 mg by mouth daily as needed for moderate pain.)  Current Outpatient Medications (Hematological):    apixaban (ELIQUIS) 5 MG TABS tablet, Take 5 mg by mouth daily.  Current Outpatient Medications (Other):    ALPRAZolam (XANAX) 0.5 MG tablet, TAKE 2 TABLETS (1 MG TOTAL) BY MOUTH EVERY DAY AT BEDTIME   ammonium lactate (AMLACTIN) 12 % cream, Apply 1 Application topically as needed for dry skin.   augmented betamethasone dipropionate (DIPROLENE-AF) 0.05 % cream, Apply 1 application  topically daily as needed (to affected areas- for itching).   carisoprodol (SOMA) 350 MG tablet, TAKE 1 TABLET BY MOUTH THREE TIMES A DAY AS NEEDED FOR MUSCLE SPASM   Cholecalciferol (VITAMIN D3) 1000 units CAPS, Take 1,000 Units by mouth daily.   Cholecalciferol 100 MCG (4000 UT) CAPS, 1 tab by mouth once daily   clobetasol cream (TEMOVATE) 0.05 %, Apply topically as needed.   doxepin (SINEQUAN) 10 MG capsule, TAKE 2 CAPSULES (20 MG TOTAL) BY MOUTH AT BEDTIME AS NEEDED.   DULoxetine (CYMBALTA) 60 MG capsule, Take 60 mg by mouth daily.   Dupilumab (DUPIXENT) 300 MG/2ML SOPN, Inject 300 mg into the skin every 14 (fourteen) days.   fluocinonide cream (LIDEX) 0.05 %, Apply topically as needed.   hydrOXYzine (ATARAX) 10 MG tablet, TAKE 1 TABLET BY MOUTH 3 TIMES DAILY AS NEEDED FOR ITCHING. Strength: 10 mg   ketoconazole (NIZORAL) 2 % cream, Apply 1 Application topically daily. (Patient taking differently: Apply 1 Application topically daily as needed for irritation.)   KLOR-CON M20 20 MEQ  tablet, TAKE 1 TABLET BY MOUTH EVERY DAY (Patient taking differently: Take 40 mEq by mouth daily.)   omeprazole (PRILOSEC) 20 MG capsule, TAKE 1 CAPSULE BY MOUTH TWICE A DAY   ondansetron (ZOFRAN) 4 MG tablet, Take 1 tablet (4 mg total) by mouth every 6 (six) hours as needed for nausea.   Respiratory Therapy Supplies (FULL KIT NEBULIZER SET) MISC, 1 each by Does not apply route as needed.   senna-docusate (SENOKOT-S) 8.6-50 MG tablet, Take 1 tablet by mouth at bedtime as needed for mild constipation.   sertraline (ZOLOFT) 100 MG tablet, Take 100 mg by mouth daily.   silver sulfADIAZINE (SILVADENE) 1 % cream, APPLY TOPICALLY TO AFFECTED AREA EVERY DAY (Patient taking differently: Apply 1 Application topically daily as needed (to wound sites that won't heal).)   tamsulosin (FLOMAX) 0.4 MG CAPS capsule, TAKE 1 CAPSULE BY MOUTH EVERY DAY (Patient taking differently: Take 0.4 mg by mouth every evening.)   traZODone (DESYREL) 100 MG tablet, TAKE 2 TABLETS BY MOUTH AT BEDTIME AS NEEDED   TUMS 500 MG chewable tablet, Chew 500-2,000 mg by mouth every 4 (four) hours as needed for indigestion or heartburn.   venlafaxine XR (EFFEXOR XR) 150 MG 24 hr capsule, Take 1 capsule (150 mg total) by mouth daily with breakfast.   zolpidem (AMBIEN) 10 MG tablet, TAKE 1 TABLET BY MOUTH EVERY DAY AT BEDTIME AS NEEDED   Reviewed prior external information including notes and imaging from  primary care provider As well as notes that were available from care everywhere and other healthcare systems.  Past medical history, social, surgical and family  history all reviewed in electronic medical record.  No pertanent information unless stated regarding to the chief complaint.   Review of Systems:  No headache, visual changes, nausea, vomiting, diarrhea, constipation, dizziness, abdominal pain, skin rash, fevers, chills, night sweats, weight loss, swollen lymph nodes, joint swelling, chest pain, shortness of breath, mood  changes. POSITIVE muscle aches, body aches  Objective  Blood pressure 102/72, pulse 67, height 5\' 10"  (1.778 m), weight 273 lb (123.8 kg), SpO2 98 %.   General: No apparent distress alert and oriented x3 mood and affect normal, dressed appropriately.  HEENT: Pupils equal, extraocular movements intact  Respiratory: Patient's speak in full sentences and does not appear short of breath  Bilateral shoulders do have crepitus noted.  Some limited range of motion.  3-5 strength of the shoulders bilaterally. Antalgic gait noted  Procedure: Real-time Ultrasound Guided Injection of right glenohumeral joint Device: GE Logiq Q7  Ultrasound guided injection is preferred based studies that show increased duration, increased effect, greater accuracy, decreased procedural pain, increased response rate with ultrasound guided versus blind injection.  Verbal informed consent obtained.  Time-out conducted.  Noted no overlying erythema, induration, or other signs of local infection.  Skin prepped in a sterile fashion.  Local anesthesia: Topical Ethyl chloride.  With sterile technique and under real time ultrasound guidance:  Joint visualized.  23g 1  inch needle inserted posterior approach. Pictures taken for needle placement. Patient did have injection of 2 cc of 1% lidocaine, 2 cc of 0.5% Marcaine, and 1.0 cc of Kenalog 40 mg/dL. Completed without difficulty  Pain immediately improved suggesting accurate placement of the medication.  Advised to call if fevers/chills, erythema, induration, drainage, or persistent bleeding.  Impression: Technically successful ultrasound guided injection.  Procedure: Real-time Ultrasound Guided Injection of left glenohumeral joint Device: GE Logiq E  Ultrasound guided injection is preferred based studies that show increased duration, increased effect, greater accuracy, decreased procedural pain, increased response rate with ultrasound guided versus blind injection.  Verbal  informed consent obtained.  Time-out conducted.  Noted no overlying erythema, induration, or other signs of local infection.  Skin prepped in a sterile fashion.  Local anesthesia: Topical Ethyl chloride.  With sterile technique and under real time ultrasound guidance:  Joint visualized.  21g 2 inch needle inserted posterior approach. Pictures taken for needle placement. Patient did have injection of 2 cc of 0.5% Marcaine, and 1cc of Kenalog 40 mg/dL. Completed without difficulty  Pain immediately improved  suggesting accurate placement of the medication.  Advised to call if fevers/chills, erythema, induration, drainage, or persistent bleeding.   Impression: Technically successful ultrasound guided injection.    Impression and Recommendations:    The above documentation has been reviewed and is accurate and complete Judi Saa, DO

## 2023-01-14 ENCOUNTER — Other Ambulatory Visit (HOSPITAL_COMMUNITY): Payer: Self-pay | Admitting: Family Medicine

## 2023-01-14 ENCOUNTER — Encounter: Payer: Self-pay | Admitting: Internal Medicine

## 2023-01-14 NOTE — Telephone Encounter (Signed)
Needs OV, or if the dizziness is more than a little, he should go to UC or ED for possible IVF's

## 2023-01-15 ENCOUNTER — Ambulatory Visit (HOSPITAL_COMMUNITY): Payer: Medicare HMO

## 2023-01-15 ENCOUNTER — Ambulatory Visit (INDEPENDENT_AMBULATORY_CARE_PROVIDER_SITE_OTHER): Payer: Medicare HMO | Admitting: Internal Medicine

## 2023-01-15 ENCOUNTER — Ambulatory Visit (INDEPENDENT_AMBULATORY_CARE_PROVIDER_SITE_OTHER): Payer: Medicare HMO

## 2023-01-15 VITALS — BP 130/76 | HR 71 | Temp 98.0°F | Ht 70.0 in | Wt 269.0 lb

## 2023-01-15 DIAGNOSIS — E538 Deficiency of other specified B group vitamins: Secondary | ICD-10-CM

## 2023-01-15 DIAGNOSIS — R1013 Epigastric pain: Secondary | ICD-10-CM

## 2023-01-15 DIAGNOSIS — R112 Nausea with vomiting, unspecified: Secondary | ICD-10-CM | POA: Diagnosis not present

## 2023-01-15 DIAGNOSIS — I499 Cardiac arrhythmia, unspecified: Secondary | ICD-10-CM

## 2023-01-15 LAB — CBC WITH DIFFERENTIAL/PLATELET
Basophils Absolute: 0.1 10*3/uL (ref 0.0–0.1)
Basophils Relative: 0.6 % (ref 0.0–3.0)
Eosinophils Absolute: 0.2 10*3/uL (ref 0.0–0.7)
Eosinophils Relative: 2.3 % (ref 0.0–5.0)
HCT: 43.2 % (ref 39.0–52.0)
Hemoglobin: 14.3 g/dL (ref 13.0–17.0)
Lymphocytes Relative: 18.8 % (ref 12.0–46.0)
Lymphs Abs: 2 10*3/uL (ref 0.7–4.0)
MCHC: 33 g/dL (ref 30.0–36.0)
MCV: 96.6 fl (ref 78.0–100.0)
Monocytes Absolute: 1 10*3/uL (ref 0.1–1.0)
Monocytes Relative: 9.7 % (ref 3.0–12.0)
Neutro Abs: 7.3 10*3/uL (ref 1.4–7.7)
Neutrophils Relative %: 68.6 % (ref 43.0–77.0)
Platelets: 253 10*3/uL (ref 150.0–400.0)
RBC: 4.47 Mil/uL (ref 4.22–5.81)
RDW: 13.9 % (ref 11.5–15.5)
WBC: 10.7 10*3/uL — ABNORMAL HIGH (ref 4.0–10.5)

## 2023-01-15 MED ORDER — PROMETHAZINE HCL 25 MG PO TABS: 25.0000 mg | ORAL_TABLET | Freq: Three times a day (TID) | ORAL | 0 refills | Status: DC | PRN

## 2023-01-15 NOTE — Progress Notes (Unsigned)
Patient ID: [name], male   DOB: 1956/05/06, 67 y.o.   MRN: [id number]        Chief Complaint: follow up 1 wk onset persistent nausea, vomit x 1 and intermittent epigastric pain, low b12, hx of arrythmia       HPI:  [name] is a 67 y.o. male here with c/o above, denies fever, chills, HA, ST cough or dysuria; Pt denies chest pain, increased sob or doe, wheezing, orthopnea, PND, increased LE swelling, palpitations, dizziness or syncope.   Pt denies polydipsia, polyuria, or new focal neuro s/s.    Pt denies fever, wt loss, night sweats, loss of appetite, or other constitutional symptoms  Denies worsening reflux, dysphagia, bowel change or blood.       Wt Readings from Last 3 Encounters:  01/15/23 269 lb (122 kg)  01/03/23 277 lb (125.6 kg)  12/31/22 285 lb (129.3 kg)   BP Readings from Last 3 Encounters:  01/15/23 130/76  01/03/23 (!) 140/100  12/31/22 118/70         Past Medical History:  Diagnosis Date   AICD (automatic cardioverter/defibrillator) present    Anemia    supposed to be taking Vit B but doesn't   ANXIETY    takes Xanax nightly   Arthritis    Asthma    Albuterol prn and Advair daily;also takes Prednisone daily   Cardiomyopathy    a. EF 25% TEE July 2013; b. EF normalized 2015;  c. 03/2015 Echo: EF 40-45%, difrf HK, PASP 38 mmHg, Mild MR, sev LAE/RAE.   Chronic constipation    takes OTC stool softener   COPD (chronic obstructive pulmonary disease)    "one dr says COPD; one dr says emphysema" (09/18/2017)   COVID-19 12/09/2019   DEPRESSION    takes Zoloft and Doxepin daily   Diverticulitis    DYSKINESIA, ESOPHAGUS    Essential hypertension        FIBROMYALGIA    GERD (gastroesophageal reflux disease)        Glaucoma    HYPERLIPIDEMIA    a. Intolerant to statins.   INSOMNIA    takes Ambien nightly   Myocardial infarction    a. 2012 Myoview notable for prior infarct;  b. 03/2015 Lexiscan CL: EF 37%, diff HK, small area of inferior infarct from apex to  base-->Med Rx.   O2 dependent    "2.5L q hs & prn" (09/18/2017)   Paroxysmal atrial fibrillation    a. CHA2DS2VASc = 3--> takes Coumadin;  b. 03/15/2015 Successful TEE/DCCV;  c. 03/2015 recurrent afib, Amio d/c'd in setting of hyperthyroidism.   Peripheral neuropathy    Pneumonia 12/2016   Rash and other nonspecific skin eruption 04/12/2009   no cause found saw dermatologists x 2 and allergist   SLEEP APNEA, OBSTRUCTIVE    a. doesn't use CPAP   Syncope    a. 03/2015 s/p MDT LINQ.   Type II diabetes mellitus        Past Surgical History:  Procedure Laterality Date   ACNE CYST REMOVAL     2 on back    AV NODE ABLATION N/A 10/25/2017   Procedure: AV NODE ABLATION;  Surgeon: Deboraha Sprang, MD;  Location: [facility];  Service: Cardiovascular;  Laterality: N/A;   BIV ICD INSERTION CRT-D N/A 09/18/2017   Procedure: BIV ICD INSERTION CRT-D;  Surgeon: Deboraha Sprang, MD;  Location: [facility];  Service: Cardiovascular;  Laterality: N/A;   CARDIAC CATHETERIZATION N/A  03/21/2016   Procedure: Right/Left Heart Cath and Coronary Angiography;  Surgeon: Larey Dresser, MD;  Location: [facility];  Service: Cardiovascular;  Laterality: N/A;   CARDIOVERSION  04/18/2012   Procedure: CARDIOVERSION;  Surgeon: Fay Records, MD;  Location: [facility];  Service: Cardiovascular;  Laterality: N/A;   CARDIOVERSION  04/25/2012   Procedure: CARDIOVERSION;  Surgeon: Thayer Headings, MD;  Location: [facility];  Service: Cardiovascular;  Laterality: N/A;   CARDIOVERSION  04/25/2012   Procedure: CARDIOVERSION;  Surgeon: Fay Records, MD;  Location: [facility];  Service: Cardiovascular;  Laterality: N/A;   CARDIOVERSION  05/09/2012   Procedure: CARDIOVERSION;  Surgeon: Sherren Mocha, MD;  Location: [facility];  Service: Cardiovascular;  Laterality: N/A;  changed from crenshaw to cooper by trish/leone-endo   CARDIOVERSION N/A 03/15/2015   Procedure: CARDIOVERSION;  Surgeon: Thayer Headings, MD;  Location: [facility];  Service: Cardiovascular;  Laterality: N/A;   COLONOSCOPY     COLONOSCOPY WITH PROPOFOL N/A 10/21/2014   Procedure: COLONOSCOPY WITH PROPOFOL;  Surgeon: Ladene Artist, MD;  Location: [facility];  Service: Endoscopy;  Laterality: N/A;   EP IMPLANTABLE DEVICE N/A 04/06/2015   Procedure: Loop Recorder Insertion;  Surgeon: Evans Lance, MD;  Location: [facility];  Service: Cardiovascular;  Laterality: N/A;   ESOPHAGOGASTRODUODENOSCOPY     JOINT REPLACEMENT     LOOP RECORDER REMOVAL N/A 09/18/2017   Procedure: LOOP RECORDER REMOVAL;  Surgeon: Deboraha Sprang, MD;  Location: [facility];  Service: Cardiovascular;  Laterality: N/A;   RIGHT/LEFT HEART CATH AND CORONARY ANGIOGRAPHY N/A 01/28/2017   Procedure: Right/Left Heart Cath and Coronary Angiography;  Surgeon: Larey Dresser, MD;  Location: [facility];  Service: Cardiovascular;  Laterality: N/A;   TEE WITHOUT CARDIOVERSION  04/25/2012   Procedure: TRANSESOPHAGEAL ECHOCARDIOGRAM (TEE);  Surgeon: Thayer Headings, MD;  Location: [facility];  Service: Cardiovascular;  Laterality: N/A;   TEE WITHOUT CARDIOVERSION N/A 03/15/2015   Procedure: TRANSESOPHAGEAL ECHOCARDIOGRAM (TEE);  Surgeon: Thayer Headings, MD;  Location: [facility];  Service: Cardiovascular;  Laterality: N/A;   TONSILLECTOMY AND ADENOIDECTOMY     TOTAL KNEE ARTHROPLASTY Right 06/15/2014   Procedure: TOTAL KNEE ARTHROPLASTY;  Surgeon: Renette Butters, MD;  Location: [facility];  Service: Orthopedics;  Laterality: Right;   TOTAL KNEE ARTHROPLASTY Left 10/17/2021   Procedure: Left TOTAL KNEE ARTHROPLASTY;  Surgeon: Mcarthur Rossetti, MD;  Location: [facility];  Service: Orthopedics;  Laterality: Left;    reports that he quit smoking about 15 years ago. His smoking use included cigarettes. He has a 60.00 pack-year smoking history. He has never used smokeless tobacco. He reports that he does not drink alcohol and does not use drugs. family history includes  Allergies in his mother; Asthma in his maternal grandmother and mother; COPD in his mother; Colon polyps in his mother; Hypothyroidism in his mother. Allergies  Allergen Reactions   Tape Other (See Comments)    SKIN TEARS VERY, VERY EASILY!!!! Use Paper Tape Only, PLEASE!!!   Amiodarone Other (See Comments)    Caused hyperthyroidism    Statins Other (See Comments)    Myalgias    Wound Dressing Adhesive Other (See Comments)    Skin Tears Use Paper Tape Only   Current Outpatient Medications on File Prior to Visit  Medication Sig Dispense Refill   acetaminophen (TYLENOL) 500 MG tablet Take 500-1,000 mg by mouth every 6 (six) hours as needed for mild pain or  headache.     albuterol (VENTOLIN HFA) 108 (90 Base) MCG/ACT inhaler INHALE 2 PUFFS BY MOUTH 4 TIMES A DAY AS NEEDED 54 each 3   ALPRAZolam (XANAX) 0.5 MG tablet TAKE 2 TABLETS (1 MG TOTAL) BY MOUTH EVERY DAY AT BEDTIME 60 tablet 5   ammonium lactate (AMLACTIN) 12 % cream Apply 1 Application topically as needed for dry skin. 385 g 3   Aspirin-Acetaminophen-Caffeine (GOODY HEADACHE PO) Take 1 packet by mouth every 6 (six) hours as needed (for pain).     augmented betamethasone dipropionate (DIPROLENE-AF) 0.05 % cream Apply 1 application  topically daily as needed (to affected areas- for itching).     carisoprodol (SOMA) 350 MG tablet TAKE 1 TABLET BY MOUTH THREE TIMES A DAY AS NEEDED FOR MUSCLE SPASM 90 tablet 2   carvedilol (COREG) 6.25 MG tablet TAKE 1 TABLET BY MOUTH TWICE A DAY 180 tablet 3   Cholecalciferol (VITAMIN D3) 1000 units CAPS Take 1,000 Units by mouth daily.     Cholecalciferol 100 MCG (4000 UT) CAPS 1 tab by mouth once daily 30 capsule 99   clobetasol cream (TEMOVATE) 0.05 % Apply topically.     dapagliflozin propanediol (FARXIGA) 10 MG TABS tablet Take 1 tablet (10 mg total) by mouth daily before breakfast. 30 tablet 8   digoxin (LANOXIN) 0.125 MG tablet Take by mouth.     doxepin (SINEQUAN) 10 MG capsule TAKE 2 CAPSULES  (20 MG TOTAL) BY MOUTH AT BEDTIME AS NEEDED. (Patient taking differently: Take 20 mg by mouth at bedtime.) 180 capsule 1   DULoxetine (CYMBALTA) 60 MG capsule Take 60 mg by mouth daily.     Dupilumab (DUPIXENT) 300 MG/2ML SOPN Inject 300 mg into the skin every 14 (fourteen) days.     ELIQUIS 5 MG TABS tablet TAKE 1 TABLET BY MOUTH TWICE A DAY (Patient taking differently: Take 5 mg by mouth in the morning.) 60 tablet 11   eplerenone (INSPRA) 25 MG tablet Take 1 tablet (25 mg total) by mouth daily. 30 tablet 8   Evolocumab (REPATHA SURECLICK) [id number] MG/ML SOAJ INJECT 1 PEN INTO THE SKIN EVERY 14 (FOURTEEN) DAYS. (Patient taking differently: Inject 140 mg into the skin every 14 (fourteen) days.) 6 mL 3   fluocinonide cream (LIDEX) 0.05 % Apply topically.     fluticasone (FLONASE) 50 MCG/ACT nasal spray PLACE 2 SPRAYS INTO BOTH NOSTRILS DAILY AS NEEDED FOR ALLERGIES OR RHINITIS. 48 mL 1   Fluticasone-Umeclidin-Vilant (TRELEGY ELLIPTA) 100-62.5-25 MCG/ACT AEPB Inhale 1 puff into the lungs daily. 180 each 2   hydrOXYzine (ATARAX) 10 MG tablet TAKE 1 TABLET BY MOUTH 3 TIMES DAILY AS NEEDED FOR ITCHING. Strength: 10 mg 60 tablet 2   ipratropium-albuterol (DUONEB) 0.5-2.5 (3) MG/3ML SOLN TAKE 3 ML BY NEBULIZATION EVERY 4 HOURS AS NEEDED (WHEEZING). (Patient taking differently: Take 3 mLs by nebulization every 4 (four) hours as needed (for wheezing or COPD symptoms).) 360 mL 0   ketoconazole (NIZORAL) 2 % cream Apply 1 Application topically daily. (Patient taking differently: Apply 1 Application topically daily as needed for irritation.) 120 g 1   KLOR-CON M20 20 MEQ tablet TAKE 1 TABLET BY MOUTH EVERY DAY (Patient taking differently: Take 40 mEq by mouth daily.) 90 tablet 3   losartan (COZAAR) 25 MG tablet TAKE 1/2 TABLET BY MOUTH EVERY DAY 45 tablet 3   omeprazole (PRILOSEC) 20 MG capsule TAKE 1 CAPSULE BY MOUTH TWICE A DAY 180 capsule 1   Omeprazole 20 MG TBDD Take by mouth.  ondansetron (ZOFRAN) 4 MG  tablet Take 1 tablet (4 mg total) by mouth every 6 (six) hours as needed for nausea. 40 tablet 0   polyethylene glycol (MIRALAX / GLYCOLAX) 17 g packet Take by mouth.     potassium chloride (KLOR-CON) 20 MEQ packet Take by mouth.     Respiratory Therapy Supplies (FULL KIT NEBULIZER SET) MISC 1 each by Does not apply route as needed. 1 each 0   senna-docusate (SENOKOT-S) 8.6-50 MG tablet Take 1 tablet by mouth at bedtime as needed for mild constipation. 30 tablet 0   sertraline (ZOLOFT) 100 MG tablet Take by mouth.     silver sulfADIAZINE (SILVADENE) 1 % cream APPLY TOPICALLY TO AFFECTED AREA EVERY DAY (Patient taking differently: Apply 1 Application topically daily as needed (to wound sites that won't heal).) 50 g 3   tamsulosin (FLOMAX) 0.4 MG CAPS capsule TAKE 1 CAPSULE BY MOUTH EVERY DAY (Patient taking differently: Take 0.4 mg by mouth every evening.) 90 capsule 3   torsemide (DEMADEX) 20 MG tablet Take 4 tablets (80 mg total) by mouth See admin instructions. Take 80 mg by mouth in the morning and evening; HOLD For a few days and resume next week with close follow up with Dr. [name]     traMADol (ULTRAM) 50 MG tablet TAKE 1 TABLET BY MOUTH EVERY DAY AS NEEDED (Patient taking differently: Take 25 mg by mouth daily as needed for moderate pain.) 30 tablet 2   traZODone (DESYREL) 100 MG tablet TAKE 2 TABLETS BY MOUTH AT BEDTIME AS NEEDED (Patient taking differently: Take 200 mg by mouth at bedtime.) 180 tablet 1   TUMS 500 MG chewable tablet Chew 500-2,000 mg by mouth every 4 (four) hours as needed for indigestion or heartburn.     venlafaxine XR (EFFEXOR XR) 150 MG 24 hr capsule Take 1 capsule (150 mg total) by mouth daily with breakfast. 90 capsule 3   zolpidem (AMBIEN) 10 MG tablet TAKE 1 TABLET BY MOUTH EVERY DAY AT BEDTIME AS NEEDED 90 tablet 1   No current facility-administered medications on file prior to visit.        ROS:  All others reviewed and negative.  Objective        PE:  BP  130/76   Pulse 71   Temp 98 F (36.7 C) (Oral)   Ht 5\' 10"  (1.778 m)   Wt 269 lb (122 kg)   SpO2 93%   BMI 38.60 kg/m                 Constitutional: Pt appears in NAD               HENT: Head: NCAT.                Right Ear: External ear normal.                 Left Ear: External ear normal.                Eyes: . Pupils are equal, round, and reactive to light. Conjunctivae and EOM are normal               Nose: without d/c or deformity               Neck: Neck supple. Gross normal ROM               Cardiovascular: Normal rate and regular rhythm.  Pulmonary/Chest: Effort normal and breath sounds without rales or wheezing.                Abd:  Soft, NT, ND, + BS, no organomegaly - benign               Neurological: Pt is alert. At baseline orientation, motor grossly intact               Skin: Skin is warm. No rashes, no other new lesions, LE edema - none               Psychiatric: Pt behavior is normal without agitation   Micro: none  Cardiac tracings I have personally interpreted today:  ECG - ventricular paced 71  Pertinent Radiological findings (summarize): none   Lab Results  Component Value Date   WBC 10.7 (H) 01/15/2023   HGB 14.3 01/15/2023   HCT 43.2 01/15/2023   PLT 253.0 01/15/2023   GLUCOSE 87 01/15/2023   CHOL 164 12/31/2022   TRIG 96.0 12/31/2022   HDL 65.80 12/31/2022   LDLDIRECT 72.0 02/28/2021   LDLCALC 79 12/31/2022   ALT 32 01/15/2023   AST 19 01/15/2023   NA 134 (L) 01/15/2023   K 4.6 01/15/2023   CL 97 01/15/2023   CREATININE 1.38 01/15/2023   BUN 22 01/15/2023   CO2 32 01/15/2023   TSH 0.99 12/31/2022   PSA 3.02 12/31/2022   INR 1.1 05/08/2021   HGBA1C 5.8 12/31/2022   MICROALBUR 1.5 12/31/2022   Assessment/Plan:  [name] is a 67 y.o. White or Caucasian [1] male with  has a past medical history of AICD (automatic cardioverter/defibrillator) present, Anemia, ANXIETY, Arthritis, Asthma, Cardiomyopathy, Chronic  constipation, COPD (chronic obstructive pulmonary disease), COVID-19 (12/09/2019), DEPRESSION, Diverticulitis, DYSKINESIA, ESOPHAGUS, Essential hypertension, FIBROMYALGIA, GERD (gastroesophageal reflux disease), Glaucoma, HYPERLIPIDEMIA, INSOMNIA, Myocardial infarction, O2 dependent, Paroxysmal atrial fibrillation, Peripheral neuropathy, Pneumonia (12/2016), Rash and other nonspecific skin eruption (04/12/2009), SLEEP APNEA, OBSTRUCTIVE, Syncope, and Type II diabetes mellitus.  Cardiac arrhythmia ECG reviewed,  to f/u any worsening symptoms or concerns   Epigastric pain Etiology unclear, for labs including cbc, cont PPI,  to f/u any worsening symptoms or concerns  Nausea and vomiting Etiology unclear, for UA with labs, phenergan prn as zofran not helping, acute abd series r/o even partial obstruction though exam underwheliming  B12 deficiency Lab Results  Component Value Date   VITAMINB12 835 12/31/2022   Stable, cont oral replacement - b12 1000 mcg qd  Followup: Return if symptoms worsen or fail to improve.  [name], MD 01/16/2023 8:07 PM [facility]

## 2023-01-15 NOTE — Patient Instructions (Signed)
Please take all new medication as prescribed  - the phenergan 25 mg as needed  Please continue all other medications as before, and refills have been done if requested.  Please have the pharmacy call with any other refills you may need.  Please continue your efforts at being more active, low cholesterol diet, and weight control.  Please keep your appointments with your specialists as you may have planned - Cardiology apr 4 as planned  Please go to the XRAY Department in the first floor for the x-ray testing  Please go to the LAB at the blood drawing area for the tests to be done  You will be contacted by phone if any changes need to be made immediately.  Otherwise, you will receive a letter about your results with an explanation, but please check with MyChart first.  Please remember to sign up for MyChart if you have not done so, as this will be important to you in the future with finding out test results, communicating by private email, and scheduling acute appointments online when needed.

## 2023-01-16 ENCOUNTER — Telehealth: Payer: Self-pay | Admitting: Internal Medicine

## 2023-01-16 ENCOUNTER — Encounter: Payer: Self-pay | Admitting: Internal Medicine

## 2023-01-16 ENCOUNTER — Other Ambulatory Visit: Payer: Self-pay | Admitting: Internal Medicine

## 2023-01-16 DIAGNOSIS — R1013 Epigastric pain: Secondary | ICD-10-CM | POA: Insufficient documentation

## 2023-01-16 DIAGNOSIS — R112 Nausea with vomiting, unspecified: Secondary | ICD-10-CM | POA: Insufficient documentation

## 2023-01-16 DIAGNOSIS — I499 Cardiac arrhythmia, unspecified: Secondary | ICD-10-CM | POA: Insufficient documentation

## 2023-01-16 LAB — HEPATIC FUNCTION PANEL
ALT: 32 U/L (ref 0–53)
AST: 19 U/L (ref 0–37)
Albumin: 4.1 g/dL (ref 3.5–5.2)
Alkaline Phosphatase: 55 U/L (ref 39–117)
Bilirubin, Direct: 0.1 mg/dL (ref 0.0–0.3)
Total Bilirubin: 0.4 mg/dL (ref 0.2–1.2)
Total Protein: 6.9 g/dL (ref 6.0–8.3)

## 2023-01-16 LAB — URINALYSIS, ROUTINE W REFLEX MICROSCOPIC
Bilirubin Urine: NEGATIVE
Hgb urine dipstick: NEGATIVE
Ketones, ur: NEGATIVE
Nitrite: NEGATIVE
RBC / HPF: NONE SEEN (ref 0–?)
Specific Gravity, Urine: 1.02 (ref 1.000–1.030)
Total Protein, Urine: NEGATIVE
Urine Glucose: 250 — AB
Urobilinogen, UA: 0.2 (ref 0.0–1.0)
pH: 6 (ref 5.0–8.0)

## 2023-01-16 LAB — LIPASE: Lipase: 50 U/L (ref 11.0–59.0)

## 2023-01-16 LAB — BASIC METABOLIC PANEL
BUN: 22 mg/dL (ref 6–23)
CO2: 32 mEq/L (ref 19–32)
Calcium: 9.4 mg/dL (ref 8.4–10.5)
Chloride: 97 mEq/L (ref 96–112)
Creatinine, Ser: 1.38 mg/dL (ref 0.40–1.50)
GFR: 52.7 mL/min — ABNORMAL LOW (ref >60.00)
Glucose, Bld: 87 mg/dL (ref 70–99)
Potassium: 4.6 mEq/L (ref 3.5–5.1)
Sodium: 134 mEq/L — ABNORMAL LOW (ref 135–145)

## 2023-01-16 MED ORDER — CIPROFLOXACIN HCL 500 MG PO TABS: 500.0000 mg | ORAL_TABLET | Freq: Two times a day (BID) | ORAL | 0 refills | Status: DC

## 2023-01-16 NOTE — Assessment & Plan Note (Signed)
Lab Results  Component Value Date   [id number] 12/31/2022   Stable, cont oral replacement - b12 1000 mcg qd

## 2023-01-16 NOTE — Assessment & Plan Note (Signed)
Etiology unclear, for UA with labs, phenergan prn as zofran not helping, acute abd series r/o even partial obstruction though exam underwheliming

## 2023-01-16 NOTE — Assessment & Plan Note (Signed)
ECG reviewed,  to f/u any worsening symptoms or concerns

## 2023-01-16 NOTE — Telephone Encounter (Signed)
Spoke with patient regarding results, xray results still pending

## 2023-01-16 NOTE — Telephone Encounter (Signed)
Patient returned [name]'s call about his results. He would like a call back at [phone].

## 2023-01-16 NOTE — Assessment & Plan Note (Signed)
Etiology unclear, for labs including cbc, cont PPI,  to f/u any worsening symptoms or concerns

## 2023-01-17 ENCOUNTER — Ambulatory Visit (HOSPITAL_COMMUNITY)
Admission: RE | Admit: 2023-01-17 | Discharge: 2023-01-17 | Disposition: A | Discharge status: Home / Self Care | Payer: Medicare HMO | Source: Ambulatory Visit | Attending: Cardiology | Admitting: Cardiology

## 2023-01-17 ENCOUNTER — Other Ambulatory Visit (HOSPITAL_COMMUNITY): Payer: Self-pay | Admitting: Emergency Medicine

## 2023-01-17 ENCOUNTER — Encounter (HOSPITAL_COMMUNITY): Payer: Self-pay

## 2023-01-17 ENCOUNTER — Ambulatory Visit (HOSPITAL_COMMUNITY): Payer: Medicare HMO

## 2023-01-17 DIAGNOSIS — I4821 Permanent atrial fibrillation: Secondary | ICD-10-CM | POA: Diagnosis not present

## 2023-01-17 DIAGNOSIS — Z95810 Presence of automatic (implantable) cardiac defibrillator: Secondary | ICD-10-CM | POA: Diagnosis not present

## 2023-01-17 DIAGNOSIS — G4733 Obstructive sleep apnea (adult) (pediatric): Secondary | ICD-10-CM | POA: Diagnosis not present

## 2023-01-17 DIAGNOSIS — J4489 Other specified chronic obstructive pulmonary disease: Secondary | ICD-10-CM | POA: Diagnosis not present

## 2023-01-17 DIAGNOSIS — Z8679 Personal history of other diseases of the circulatory system: Secondary | ICD-10-CM | POA: Diagnosis not present

## 2023-01-17 DIAGNOSIS — I5022 Chronic systolic (congestive) heart failure: Secondary | ICD-10-CM | POA: Diagnosis not present

## 2023-01-17 DIAGNOSIS — Z8616 Personal history of COVID-19: Secondary | ICD-10-CM | POA: Diagnosis not present

## 2023-01-17 DIAGNOSIS — Z87891 Personal history of nicotine dependence: Secondary | ICD-10-CM | POA: Insufficient documentation

## 2023-01-17 DIAGNOSIS — Z7901 Long term (current) use of anticoagulants: Secondary | ICD-10-CM | POA: Diagnosis not present

## 2023-01-17 DIAGNOSIS — Z9981 Dependence on supplemental oxygen: Secondary | ICD-10-CM | POA: Insufficient documentation

## 2023-01-17 DIAGNOSIS — I428 Other cardiomyopathies: Secondary | ICD-10-CM | POA: Insufficient documentation

## 2023-01-17 DIAGNOSIS — Z79899 Other long term (current) drug therapy: Secondary | ICD-10-CM | POA: Insufficient documentation

## 2023-01-17 DIAGNOSIS — I110 Hypertensive heart disease with heart failure: Secondary | ICD-10-CM | POA: Diagnosis not present

## 2023-01-17 DIAGNOSIS — I2510 Atherosclerotic heart disease of native coronary artery without angina pectoris: Secondary | ICD-10-CM | POA: Insufficient documentation

## 2023-01-17 MED ORDER — LOSARTAN POTASSIUM 25 MG PO TABS: 25.0000 mg | ORAL_TABLET | Freq: Every day | ORAL | 3 refills | Status: DC

## 2023-01-17 MED ORDER — TORSEMIDE 20 MG PO TABS
ORAL_TABLET | ORAL | 3 refills | Status: DC
Start: 2023-01-17 — End: 2023-03-12

## 2023-01-17 NOTE — Progress Notes (Signed)
ID:  [name], DOB Jun 20, 1956, MRN [id number]  Provider location: [facility] Type of Visit: Established patient  PCP:  [name], MD  Cardiologist:  Dr. [name]   History of Present Illness: [name] is a 67 y.o. male who has history of COPD on home oxygen, permanent atrial fibrillation, and chronic systolic CHF.  Amiodarone and DCCV were tried for atrial fibrillation in the past without success.   Patient has been noted to have a low EF since 2013.  EF improved to normal by 2015 but dropped to 30-35% by 11/16.  Cath in 6/17 showed some CAD but not enough to cause cardiomyopathy.  He was admitted in 3/18 with PNA, atrial fibrillation/RVR, and acute on chronic systolic CHF.  Echo in 3/18 showed fall in EF to 10-15%.   Underwent right and left heart cath in 4/18, which showed nonobstructive CAD and relatively optimized filling pressures.    Syncope in 11/18. Loop recorder showed 6 second pause and periods of complete heart block. He had Medtronic BiV ICD placed in 12/18.  In 1/19, he had AV nodal ablation to promote BiV pacing.   Echo was done 6/19 with EF 25-30% with moderate RV dilation/mildly decreased systolic function. CPX 7/19 was submaximal, probably mild functional impairment.    Echo in 1/21 showed EF 35-40% with diffuse hypokinesis, moderate RV dilation with mildly decreased systolic function.   He was admitted with COVID-19 PNA in 2/19.  He had AKI/hypotension and Entresto was stopped.   Echo in 3/22 showed EF 40% with diffuse hypokinesis, mild RV enlargement, mildly decreased RV systolic function.   Presented to [facility] 7/22 with weakness and dizziness. Found to be in hemorrhagic shock from acute GIB. Hgb 5.0, INR 8.4. He was on Coumadin for atrial fibrillation. He was resuscitated with PRBCs, IVF, and given Vitamin K. His Entresto, beta blocker and torsemide were held due to hypotension. He initially required ICU monitoring due to hypotension  requiring pressors. GI team was consulted and planned to treat with oral antibiotics for suspected diverticular bleed with plans for outpatient colonoscopy.  Coumadin was transitioned to Eliquis.  He was discharged back on beta blocker, Entresto and SGLT2i. Dig, spiro and torsemide were held at discharge. Weight 259 lbs.  S/p TKR 1/23  Follow up 12/23, NYHA II-early III, volume overloaded and torsemide increased to 80 bid.   Echo 1/24 EF 40%, RV normal, no significant change from prior.  Admitted 2/24 w/ RLE cellulitis, failed outpatient abx. Received IV eventually transitioned to oral cefadroxil. Followed by ID, saw Dr. Baxter Flattery 12/18/22 and arranged for CT of RLE to rule out deep tissue abscess. Finished abx course.  Recently seen in clinic on 3/5 and was noted to be volume overloaded by Optivol. Low BP limited GDMT titration. He had been off torsemide, [place] and Inspra for unknown reason. Instructed to hold Coreg. Torsemide, Farxiga and Inspra resumed.   He returns back today for 4 wk f/u. Here w/ paramedicine. Volume improved. Optivol down below threshold. Impedence up. BP 106/76. Denies resting dyspnea. C/w NYHA Class II-early III symptoms (stable). He is not very active a baseline. Pretty sedentary. Main issue currently is UTI. Was seen at PCP office yesterday for dysuria. UA +. WBC 10K. Started on course of abx. Other labs reviewed,  K was 4.6, Scr 1.38.  This is his first UTI   ECG (personally reviewed): Not performed   MDT device interrogation: OptiVol fluid index is down, impedence up. No  VT/ VF   Labs (1/19): K 4.4, creatinine 0.75, pro-BNP 433 Labs (2/19): digoxin 0.4 Labs (3/19): LDL 121, hgb 12.8, K 4.5, creatinine 0.98 Labs (6/19): LDL 48, HDL 45, K 5, creatinine 0.94, digoxin 0.3 Labs (9/19): K 3.7, creatinine 1.09 => 1.32, digoxin 0.2 Labs (11/19): LDL 115 Labs (12/20): K 3.9, creatinine 0.87, LDL 94  Labs (3/21): K 4, creatinine 1.08 Labs (6/21): LDL 109 Labs (7/21): K 3.7,  creatinine 1.15 Labs (12/21): LDL 129, TGs 239, hgb 12.9, K 3.9, creatinine 0.98, digoxin 0.8 Labs (1/22): ALT 76, AST 40, LDL 162, K 3.8 =>4.6, creatinine 1.16 => 1.12, digoxin 0.7, repeat LFTs normal Labs (5/22): LDL 72, TGs 335, K 4.2, creatinine 1.29 Labs (7/22): K 4.0, creatinine 0.77 Labs (8/22): K 4.0, creatinine 1.11 Labs (9/22): K 3.7, creatinine 1.14, hgb 11.7 Labs (3/23): LDL 28, TGs 132, LFTs normal, K 4.3, creatinine 1.0 Labs (6/23): K 4.4, creatinine 1.37 Labs (2/24): K 3.2, creatinine 1.45 Labs (3/24): K 4.4, creatinine 1.13, LDL 79 Labs (4/24): K 4.6, creatinine 1.38   PMH: 1. Asthma 2. COPD: On home oxygen. Prior smoker.  3. Atrial fibrillation: Permanent.  He failed DCCV and amiodarone in the past . 4. HTN 5. GERD 6. Hyperlipidemia: Myalgias with atorvastatin 7. BPH.  8. Chronic systolic CHF: Nonischemic cardiomyopathy.  - EF 25% by TEE in 7/13.  Normalized on 2015 echo. - Echo (11/16): EF 30-35%.  - LHC/RHC (6/17): 80% ostial stenosis small D1, 30-40% pLAD.  Mean RA 4, PA 26/10, mean PCWP 11, CI 2.33.  - Echo (3/18): EF 10-15%, diffuse hypokinesis, severe LV dilation, moderate RV dilation with severely decreased systolic function.  - LHC/RHC (4/18): 40% proximal LAD.  Mean RA 8, PA 37/10 mean 22, mean PCWP 22, LVEDP 9, CI 2.39. - Medtronic BiV ICD placement in 12/18 with AV nodal ablation to promote BiV pacing in 1/19.  - Echo (6/19): EF 25-30% with mild LV dilation, moderately dilated RV with mildly decreased systolic function, IVC not dilated, PASP 23 mmHg.  - CPX (7/19): peak VO2 17.6 (78% predicted), VE/VCO2 slope 31, RER 1.02 => submaximal, probably mild functional impairment.  - Echo (1/21): EF 35-40% with diffuse hypokinesis, moderate RV dilation with mildly decreased systolic function. - Echo (3/22): EF 40% with diffuse hypokinesis, mild RV enlargement, mildly decreased RV systolic function.  - Echo (1/24): EF 40%, RV normal, AAA 40 mm 9. Morbid  obesity 10. ILR in place.  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 Complete heart block: s/p Medtronic CRT-D.  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 OSA: Untreated, unable to tolerate CPAP.  15. COVID-19 PNA 2/21.  16. Hyperlipidemia: Myalgias with multiple statins.  17. PAD: ABIs 10/21 noncompressible on right but TBI normal, normal ABI and TBI on left.  18. GI bleeding: 7/22, diverticulosis.  19. Left TKR (1/23)  Social History   Socioeconomic History   Marital status: Divorced    Spouse name: Not on file   Number of children: 2   Years of education: Not on file   Highest education level: Not on file  Occupational History   Occupation: retired/disabled. prev worked in Therapist, sports.    Employer: DISABLED  Tobacco Use   Smoking status: Former    Packs/day: 2.00    Years: 30.00    Additional pack years: 0.00    Total pack years: 60.00    Types: Cigarettes    Quit date: 10/16/2007    Years since quitting: 15.2   Smokeless tobacco: Never  Vaping Use  Vaping Use: Never used  Substance and Sexual Activity   Alcohol use: No   Drug use: No   Sexual activity: Not Currently  Other Topics Concern   Not on file  Social History Narrative   Lives alone.   Social Determinants of Health   Financial Resource Strain: Low Risk  (06/29/2022)   Overall Financial Resource Strain (CARDIA)    Difficulty of Paying Living Expenses: Not hard at all  Food Insecurity: No Food Insecurity (11/26/2022)   Hunger Vital Sign    Worried About Running Out of Food in the Last Year: Never true    Ran Out of Food in the Last Year: Never true  Transportation Needs: No Transportation Needs (11/26/2022)   PRAPARE - Hydrologist (Medical): No    Lack of Transportation (Non-Medical): No  Physical Activity: Inactive (06/29/2022)   Exercise Vital Sign    Days of Exercise per Week: 0 days    Minutes of Exercise per Session: 0 min  Stress: No Stress Concern Present (06/29/2022)    [place]    Feeling of Stress : Not at all  Social Connections: Moderately Integrated (06/29/2022)   Social Connection and Isolation Panel [NHANES]    Frequency of Communication with Friends and Family: More than three times a week    Frequency of Social Gatherings with Friends and Family: More than three times a week    Attends Religious Services: More than 4 times per year    Active Member of [employer] or Organizations: Yes    Attends Music therapist: More than 4 times per year    Marital Status: Divorced  Intimate Partner Violence: Not At Risk (11/22/2022)   Humiliation, Afraid, Rape, and Kick questionnaire    Fear of Current or Ex-Partner: No    Emotionally Abused: No    Physically Abused: No    Sexually Abused: No   Family History  Problem Relation Age of Onset   COPD Mother    Asthma Mother    Colon polyps Mother    Allergies Mother    Hypothyroidism Mother    Asthma Maternal Grandmother    Colon cancer Neg Hx    ROS: All systems reviewed and negative except as per HPI.   Current Outpatient Medications  Medication Sig Dispense Refill   acetaminophen (TYLENOL) 500 MG tablet Take 500-1,000 mg by mouth every 6 (six) hours as needed for mild pain or headache.     albuterol (VENTOLIN HFA) 108 (90 Base) MCG/ACT inhaler INHALE 2 PUFFS BY MOUTH 4 TIMES A DAY AS NEEDED 54 each 3   ALPRAZolam (XANAX) 0.5 MG tablet TAKE 2 TABLETS (1 MG TOTAL) BY MOUTH EVERY DAY AT BEDTIME 60 tablet 5   ammonium lactate (AMLACTIN) 12 % cream Apply 1 Application topically as needed for dry skin. 385 g 3   apixaban (ELIQUIS) 5 MG TABS tablet Take 5 mg by mouth daily.     Aspirin-Acetaminophen-Caffeine (GOODY HEADACHE PO) Take 1 packet by mouth every 6 (six) hours as needed (for pain).     augmented betamethasone dipropionate (DIPROLENE-AF) 0.05 % cream Apply 1 application  topically daily as needed (to affected areas- for  itching).     carisoprodol (SOMA) 350 MG tablet TAKE 1 TABLET BY MOUTH THREE TIMES A DAY AS NEEDED FOR MUSCLE SPASM 90 tablet 2   carvedilol (COREG) 6.25 MG tablet TAKE 1 TABLET BY MOUTH TWICE A DAY 180 tablet  3   clobetasol cream (TEMOVATE) 0.05 % Apply topically as needed.     digoxin (LANOXIN) 0.125 MG tablet Take 1 tablet by mouth daily.     doxepin (SINEQUAN) 10 MG capsule TAKE 2 CAPSULES (20 MG TOTAL) BY MOUTH AT BEDTIME AS NEEDED. 180 capsule 1   DULoxetine (CYMBALTA) 60 MG capsule Take 60 mg by mouth daily.     eplerenone (INSPRA) 25 MG tablet Take 1 tablet (25 mg total) by mouth daily. 30 tablet 8   fluocinonide cream (LIDEX) 0.05 % Apply topically as needed.     fluticasone (FLONASE) 50 MCG/ACT nasal spray PLACE 2 SPRAYS INTO BOTH NOSTRILS DAILY AS NEEDED FOR ALLERGIES OR RHINITIS. 48 mL 1   Fluticasone-Umeclidin-Vilant (TRELEGY ELLIPTA) 100-62.5-25 MCG/ACT AEPB Inhale 1 puff into the lungs daily. (Patient taking differently: Inhale 1 puff into the lungs daily. As needed) 180 each 2   hydrOXYzine (ATARAX) 10 MG tablet TAKE 1 TABLET BY MOUTH 3 TIMES DAILY AS NEEDED FOR ITCHING. Strength: 10 mg 60 tablet 2   ipratropium-albuterol (DUONEB) 0.5-2.5 (3) MG/3ML SOLN TAKE 3 ML BY NEBULIZATION EVERY 4 HOURS AS NEEDED (WHEEZING). (Patient taking differently: Take 3 mLs by nebulization every 4 (four) hours as needed (for wheezing or COPD symptoms).) 360 mL 0   ketoconazole (NIZORAL) 2 % cream Apply 1 Application topically daily. (Patient taking differently: Apply 1 Application topically daily as needed for irritation.) 120 g 1   KLOR-CON M20 20 MEQ tablet TAKE 1 TABLET BY MOUTH EVERY DAY (Patient taking differently: Take 40 mEq by mouth daily.) 90 tablet 3   omeprazole (PRILOSEC) 20 MG capsule TAKE 1 CAPSULE BY MOUTH TWICE A DAY 180 capsule 1   ondansetron (ZOFRAN) 4 MG tablet Take 1 tablet (4 mg total) by mouth every 6 (six) hours as needed for nausea. 40 tablet 0   promethazine (PHENERGAN) 25 MG  tablet Take 1 tablet (25 mg total) by mouth every 8 (eight) hours as needed for nausea or vomiting. 30 tablet 0   Respiratory Therapy Supplies (FULL KIT NEBULIZER SET) MISC 1 each by Does not apply route as needed. 1 each 0   senna-docusate (SENOKOT-S) 8.6-50 MG tablet Take 1 tablet by mouth at bedtime as needed for mild constipation. 30 tablet 0   sertraline (ZOLOFT) 100 MG tablet Take 100 mg by mouth daily.     silver sulfADIAZINE (SILVADENE) 1 % cream APPLY TOPICALLY TO AFFECTED AREA EVERY DAY (Patient taking differently: Apply 1 Application topically daily as needed (to wound sites that won't heal).) 50 g 3   tamsulosin (FLOMAX) 0.4 MG CAPS capsule TAKE 1 CAPSULE BY MOUTH EVERY DAY (Patient taking differently: Take 0.4 mg by mouth every evening.) 90 capsule 3   traMADol (ULTRAM) 50 MG tablet TAKE 1 TABLET BY MOUTH EVERY DAY AS NEEDED (Patient taking differently: Take 25 mg by mouth daily as needed for moderate pain.) 30 tablet 2   traZODone (DESYREL) 100 MG tablet TAKE 2 TABLETS BY MOUTH AT BEDTIME AS NEEDED 180 tablet 1   TUMS 500 MG chewable tablet Chew 500-2,000 mg by mouth every 4 (four) hours as needed for indigestion or heartburn.     venlafaxine XR (EFFEXOR XR) 150 MG 24 hr capsule Take 1 capsule (150 mg total) by mouth daily with breakfast. 90 capsule 3   zolpidem (AMBIEN) 10 MG tablet TAKE 1 TABLET BY MOUTH EVERY DAY AT BEDTIME AS NEEDED 90 tablet 1   Cholecalciferol (VITAMIN D3) 1000 units CAPS Take 1,000 Units by mouth  daily. (Patient not taking: Reported on 01/17/2023)     Cholecalciferol 100 MCG (4000 UT) CAPS 1 tab by mouth once daily (Patient not taking: Reported on 01/17/2023) 30 capsule 99   Dupilumab (DUPIXENT) 300 MG/2ML SOPN Inject 300 mg into the skin every 14 (fourteen) days. (Patient not taking: Reported on 01/17/2023)     Evolocumab (REPATHA SURECLICK) [id number] MG/ML SOAJ INJECT 1 PEN INTO THE SKIN EVERY 14 (FOURTEEN) DAYS. (Patient not taking: Reported on 01/17/2023) 6 mL 3   losartan  (COZAAR) 25 MG tablet Take 1 tablet (25 mg total) by mouth daily. 90 tablet 3   torsemide (DEMADEX) 20 MG tablet Take 5 tablets (100 mg total) by mouth every morning AND 4 tablets (80 mg total) every evening. 810 tablet 3   No current facility-administered medications for this encounter.   Wt Readings from Last 3 Encounters:  01/17/23 124.1 kg (273 lb 9.6 oz)  01/15/23 122 kg (269 lb)  01/03/23 125.6 kg (277 lb)   BP 106/76   Pulse 73   Wt 124.1 kg (273 lb 9.6 oz)   SpO2 95%   BMI 39.26 kg/m   PHYSICAL EXAM: General:  chronically ill appearing, obese in WC. No respiratory difficulty HEENT: normal Neck: supple. no JVD. Carotids 2+ bilat; no bruits. No lymphadenopathy or thyromegaly appreciated. Cor: PMI nondisplaced. Regular rate & rhythm. No rubs, gallops or murmurs. Lungs: clear Abdomen: obese, soft, nontender, nondistended. No hepatosplenomegaly. No bruits or masses. Good bowel sounds. Extremities: no cyanosis, clubbing, rash, trace b/l LEE R>L chronic venous stasis dermatitis  Neuro: alert & oriented x 3, cranial nerves grossly intact. moves all 4 extremities w/o difficulty. Affect pleasant.   Assessment/Plan:  1. Atrial fibrillation: Permanent.  Now s/p AV nodal ablation to allow effective BiV pacing.  - Continue Eliquis 5 mg bid. Denies abnormal bleeding. Check CBC today  2. COPD: No longer smoking.  Not using oxygen.  - followed by Dr. Arnoldo Lenis  3. Chronic systolic CHF: Nonischemic cardiomyopathy based on 6/17 cath. However, he had a recent significant fall in EF from 30-35% to 10-15% on echo from 3/18.  LHC/RHC in 4/18 showed nonobstructive CAD and relatively optimized filling pressures.  He now has Medtronic CRT-D device with AV nodal ablation to allow BiV pacing.  Echo 6/19 with EF 25-30% with mildly decreased RV systolic function. CPX 7/19 was submaximal but likely only mild functional limitation from CHF.  Echo in 1/21 showed EF 35-40%, echo in 3/22 showed EF up to 40% with  mildly decreased RV systolic function.  NYHA class II-early III (chronic)  confounded by body habitus and physical deconditioning. Volume much improved. Optivol fluid index/impedance back to normal.  - Unfortunately now w/ UTI after starting Farxia. Will discontinue. I think risk of recurrence would be high given body habitus, large panus and sedentary lifestyle.  - Continue Losartan 12.5 mg bid. Did not tolerate Entresto due to AKI and hypotension  - Continue inspra 25 mg daily  - Add back Coreg next visit if BP allows  - BMP from PCP office reviewed, SCr and K stable  4. CAD: Nonobstructive on 4/18 cath.  No exertional chest pain.  - No ASA with Eliquis use. - Statin myalgias, continue Repatha. Good LDL in 3/24 (LDL 79).  5. Complete heart block: Now has Medtronic CRT-D device and s/p AV nodal ablation.  6. OSA: Unable to tolerate CPAP.   7. Elevated LFTs: Most recent LFTs back to normal.   8. Lower extremity wound/recent cellulitis: Suspect  venous stasis and volume overload contributory. He had recent admission for cellulitis and completed IV abx. Followed by ID, Dr. Baxter Flattery - ID now following 9. Medication management: [facility] with paramedicine now following.   F/u w/ APP in 4 wks   Signed, Lyda Jester, PA-C  01/17/2023  [facility] [address] [facility] [place] [address] [phone] (office) [phone] (fax)

## 2023-01-17 NOTE — Patient Instructions (Signed)
No Labs done today.   RESTART Losartan 25mg  (1 tablet) by mouth daily.   STOP taking Farxiga  INCREASE Torsemide to 100mg  (5 tablets) by mouth every morning and 80mg  (4 tablets) by mouth every evening.   No other medication changes were made. Please continue all current medications as prescribed.  Your physician recommends that you schedule a follow-up appointment in: 4 weeks with our NP/PA Clinic here in our office.   If you have any questions or concerns before your next appointment please send [place] a message through [place] or call our office at [phone].    TO LEAVE A MESSAGE FOR THE NURSE SELECT OPTION 2, PLEASE LEAVE A MESSAGE INCLUDING: YOUR NAME DATE OF BIRTH CALL BACK NUMBER REASON FOR CALL**this is important as we prioritize the call backs  YOU WILL RECEIVE A CALL BACK THE SAME DAY AS LONG AS YOU CALL BEFORE 4:00 PM   Do the following things EVERYDAY: Weigh yourself in the morning before breakfast. Write it down and keep it in a log. Take your medicines as prescribed Eat low salt foods--Limit salt (sodium) to 2000 mg per day.  Stay as active as you can everyday Limit all fluids for the day to less than 2 liters   At the [facility], you and your health needs are our priority. As part of our continuing mission to provide you with exceptional heart care, we have created designated Provider Care Teams. These Care Teams include your primary Cardiologist (physician) and Advanced Practice Providers (APPs- Physician Assistants and Nurse Practitioners) who all work together to provide you with the care you need, when you need it.   You may see any of the following providers on your designated Care Team at your next follow up: Dr Glori Bickers Dr Haynes Kerns, NP Lyda Jester, [place] Audry Riles, PharmD   Please be sure to bring in all your medications bottles to every appointment.

## 2023-01-18 ENCOUNTER — Ambulatory Visit: Payer: Medicare HMO | Admitting: Family Medicine

## 2023-01-18 ENCOUNTER — Encounter: Payer: Self-pay | Admitting: Family Medicine

## 2023-01-18 ENCOUNTER — Ambulatory Visit: Payer: Self-pay

## 2023-01-18 ENCOUNTER — Telehealth (HOSPITAL_COMMUNITY): Payer: Self-pay | Admitting: Emergency Medicine

## 2023-01-18 VITALS — BP 102/72 | HR 67 | Ht 70.0 in | Wt 273.0 lb

## 2023-01-18 DIAGNOSIS — M12811 Other specific arthropathies, not elsewhere classified, right shoulder: Secondary | ICD-10-CM

## 2023-01-18 DIAGNOSIS — M25512 Pain in left shoulder: Secondary | ICD-10-CM | POA: Diagnosis not present

## 2023-01-18 DIAGNOSIS — M12812 Other specific arthropathies, not elsewhere classified, left shoulder: Secondary | ICD-10-CM | POA: Diagnosis not present

## 2023-01-18 DIAGNOSIS — G8929 Other chronic pain: Secondary | ICD-10-CM

## 2023-01-18 DIAGNOSIS — M25511 Pain in right shoulder: Secondary | ICD-10-CM | POA: Diagnosis not present

## 2023-01-18 MED ORDER — PREDNISONE 10 MG PO TABS
10.0000 mg | ORAL_TABLET | Freq: Every day | ORAL | 0 refills | Status: DC
Start: 2023-01-18 — End: 2023-01-30

## 2023-01-18 NOTE — Telephone Encounter (Signed)
[name] text me today to advise he was going to see his brother/family out of town.  He had a home visit scheduled for today @ 4:00.  He asked if we could do a home visit next week instead. He also says he just needs to get away for a bit. I advised him we could re-schedule for Tuesday next week.  I also stressed the importance of taking his meds and to listen to his body and try not to over do it.  He advises he understands same.

## 2023-01-18 NOTE — Assessment & Plan Note (Addendum)
Chronic problem with worsening symptoms.  Secondary to chronic comorbidities and patient's social determinants of health including patient having difficulty with physical activity due to his comorbidities the patient has difficulty with the shoulders and likely is not a surgical candidate with the congestive heart failure at the moment.  Still think that we do want to consider him getting healthier to allow potential placement.  Injections given again today and hopefully this will make significant improvement.  Does have acromioclavicular arthritis that may need to be further assessed in 6 to 8 weeks.  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

## 2023-01-18 NOTE — Progress Notes (Signed)
Paramedicine Encounter   Patient ID: [name] , male,   DOB: 28-Oct-1955,67 y.o.,  MRN: [id number]   Met patient in clinic today with provider.  Time spent with patient 45 minutes  [name] reports to not feeling his best today and was diagnosed with a UTI yesterday at his PCP visit.  He has just started Cipro for same. Pt. Advised to stop taking [place] and Torsemide dose was changed to 100mg  in the a.m. and 80mg  in the p.m.  Also he will be taking 25mg . Losartan daily.   He finally agreed at this visit that he will let me do a pill box for him.  I expressed that this is the best way to measure his medication compliance and he agreed to same.  I will do a home visit tomorrow afternoon @ 4:00 to set up pill box.  Visit complete.  Beatrix Shipper, EMT-Paramedic  [phone] 01/18/2023

## 2023-01-18 NOTE — Patient Instructions (Signed)
Injected both shoulders today Refilled Prednisone-try to take half when you can See me again in 10 weeks

## 2023-01-21 ENCOUNTER — Ambulatory Visit: Payer: Medicare HMO | Attending: Internal Medicine

## 2023-01-21 DIAGNOSIS — Z95810 Presence of automatic (implantable) cardiac defibrillator: Secondary | ICD-10-CM | POA: Diagnosis not present

## 2023-01-21 DIAGNOSIS — I5022 Chronic systolic (congestive) heart failure: Secondary | ICD-10-CM | POA: Diagnosis not present

## 2023-01-23 ENCOUNTER — Other Ambulatory Visit (HOSPITAL_COMMUNITY): Payer: Self-pay | Admitting: Emergency Medicine

## 2023-01-23 ENCOUNTER — Other Ambulatory Visit: Payer: Self-pay | Admitting: Internal Medicine

## 2023-01-23 ENCOUNTER — Ambulatory Visit (INDEPENDENT_AMBULATORY_CARE_PROVIDER_SITE_OTHER): Payer: Medicare HMO

## 2023-01-23 DIAGNOSIS — I255 Ischemic cardiomyopathy: Secondary | ICD-10-CM

## 2023-01-23 DIAGNOSIS — I442 Atrioventricular block, complete: Secondary | ICD-10-CM | POA: Diagnosis not present

## 2023-01-23 DIAGNOSIS — I428 Other cardiomyopathies: Secondary | ICD-10-CM

## 2023-01-23 DIAGNOSIS — I4819 Other persistent atrial fibrillation: Secondary | ICD-10-CM

## 2023-01-23 LAB — CUP PACEART REMOTE DEVICE CHECK
Battery Remaining Longevity: 19 mo
Battery Voltage: 2.91 V
Brady Statistic AP VP Percent: 0 %
Brady Statistic AP VS Percent: 0 %
Brady Statistic AS VP Percent: 0 %
Brady Statistic AS VS Percent: 0 %
Brady Statistic RA Percent Paced: 0 %
Brady Statistic RV Percent Paced: 99.85 %
Date Time Interrogation Session: 20240409162509
HighPow Impedance: 96 Ohm
Implantable Lead Connection Status: 753985
Implantable Lead Connection Status: 753985
Implantable Lead Implant Date: 20181205
Implantable Lead Implant Date: 20181205
Implantable Lead Location: 753858
Implantable Lead Location: 753860
Implantable Lead Model: 4398
Implantable Pulse Generator Implant Date: 20181205
Lead Channel Impedance Value: 1121 Ohm
Lead Channel Impedance Value: 1140 Ohm
Lead Channel Impedance Value: 1197 Ohm
Lead Channel Impedance Value: 1254 Ohm
Lead Channel Impedance Value: 261.164
Lead Channel Impedance Value: 309.309
Lead Channel Impedance Value: 316.116
Lead Channel Impedance Value: 317.915
Lead Channel Impedance Value: 325.111
Lead Channel Impedance Value: 361 Ohm
Lead Channel Impedance Value: 4047 Ohm
Lead Channel Impedance Value: 418 Ohm
Lead Channel Impedance Value: 513 Ohm
Lead Channel Impedance Value: 532 Ohm
Lead Channel Impedance Value: 779 Ohm
Lead Channel Impedance Value: 836 Ohm
Lead Channel Impedance Value: 836 Ohm
Lead Channel Impedance Value: 931 Ohm
Lead Channel Pacing Threshold Amplitude: 0.375 V
Lead Channel Pacing Threshold Pulse Width: 0.4 ms
Lead Channel Sensing Intrinsic Amplitude: 11.125 mV
Lead Channel Sensing Intrinsic Amplitude: 11.125 mV
Lead Channel Setting Pacing Amplitude: 2.25 V
Lead Channel Setting Pacing Amplitude: 2.5 V
Lead Channel Setting Pacing Pulse Width: 0.4 ms
Lead Channel Setting Pacing Pulse Width: 0.6 ms
Lead Channel Setting Sensing Sensitivity: 0.3 mV
Zone Setting Status: 755011

## 2023-01-23 NOTE — Progress Notes (Signed)
Paramedicine Encounter    Patient ID: [name], male    DOB: 06/02/1956, 67 y.o.   MRN: [id number]   Complaints***  Assessment***  Compliance with meds***  Pill box filled***  Refills needed***  Meds changes since last visit***    Social changes***   There were no vitals taken for this visit. Weight yesterday-*** Last visit weight-***  ACTION: {Paramed Action:[phone]}  Beatrix ShipperDede Cherae Marton, EMT-Paramedic [phone] 01/23/23  Patient Care Team: Corwin LevinsJohn, James W, MD as PCP - General (Internal Medicine) Laurey MoraleMcLean, Dalton S, MD as PCP - Advanced Heart Failure (Cardiology) Duke SalviaKlein, Steven C, MD as PCP - Electrophysiology (Cardiology) Himmelrich, Loree FeeSara S, RD (Inactive) as Dietitian Duke SalviaKlein, Steven C, MD as Consulting Physician (Cardiology) Kathryne HitchBlackman, Christopher Y, MD as Consulting Physician (Orthopedic Surgery) Levy PupaGriessel, Chanda A, MD as Referring Physician (Ophthalmology)  Patient Active Problem List   Diagnosis Date Noted  . Epigastric pain 01/16/2023  . Nausea and vomiting 01/16/2023  . Cardiac arrhythmia 01/16/2023  . Cellulitis and abscess of leg, except foot 11/21/2022  . Cellulitis of right lower extremity without foot 11/20/2022  . Anxiety and depression 05/20/2022  . AC (acromioclavicular) arthritis 04/24/2022  . Cataract, nuclear sclerotic, left eye 03/07/2022  . ED (erectile dysfunction) 01/03/2022  . Intertrigo 12/24/2021  . Gluteal tendinitis of left buttock 12/19/2021  . Unilateral primary osteoarthritis, left knee 10/17/2021  . Status post total knee replacement, left 10/17/2021  . Rib pain on right side 10/10/2021  . History of colonic polyps 09/26/2021  . Bursitis of left hip 06/13/2021  . Tooth fracture 06/11/2021  . Pain and swelling of right lower leg 05/18/2021  . Gastrointestinal hemorrhage   . Diverticulitis of colon   . Preop exam for internal medicine 03/04/2021  . B12 deficiency 03/04/2021  . Vitamin D deficiency 03/01/2021  . Myalgia  12/01/2020  . Laceration of left leg 09/17/2020  . Statin myopathy 08/02/2020  . Rotator cuff arthropathy of both shoulders 02/23/2020  . (HFpEF) heart failure with preserved ejection fraction 02/22/2020  . ICD (implantable cardioverter-defibrillator) in place 12/27/2019  . Elevated PSA 09/23/2019  . Lip lesion 12/23/2018  . Diabetic ulcer of left lower leg with necrosis of muscle 12/05/2018  . Chronic bursitis of left shoulder 07/01/2018  . Pain due to total knee replacement 03/31/2018  . Rotator cuff arthropathy, left 02/13/2018  . Right rotator cuff tear arthropathy 01/14/2018  . Increased prostate specific antigen (PSA) velocity 09/26/2017  . HFrEF (heart failure with reduced ejection fraction) 09/18/2017  . Trigger point of thoracic region 07/04/2017  . Right lumbar radiculopathy 05/15/2017  . Right leg swelling 05/09/2017  . Glaucoma 04/18/2017  . History of nasal polyposis 02/19/2017  . Chronic rhinitis 02/19/2017  . Microhematuria 12/04/2016  . Hyperlipidemia 08/15/2016  . Encounter for well adult exam with abnormal findings 11/29/2015  . Syncope 03/30/2015  . Former tobacco use 02/25/2015  . Type 2 diabetes mellitus 02/25/2015  . Permanent atrial fibrillation 02/25/2015  . Anemia 02/23/2015  . Thyrotoxicosis 02/23/2015  . Hx of adenomatous colonic polyps 10/21/2014  . De Quervain's tenosynovitis, right 04/30/2014  . Osteoarthritis of right knee 02/19/2014  . Encounter for therapeutic drug monitoring 11/17/2013  . Thoracic degenerative disc disease 06/17/2013  . Thoracic spondylosis without myelopathy 02/11/2013  . Lumbosacral spondylosis without myelopathy 02/11/2013  . Degenerative joint disease of knee, left 02/11/2013  . COPD (chronic obstructive pulmonary disease) 08/20/2012  . Bundle branch block, right and extreme left anterior fascicular 05/05/2012  . Nonischemic cardiomyopathy 05/02/2012  . Anticoagulated  on warfarin 03/21/2012  . Spongiotic dermatitis  09/26/2011  . Past myocardial infarction 04/19/2011  . UNSPECIFIED URETHRAL STRICTURE 12/26/2010  . Chronic pain syndrome 05/18/2010  . OSA (obstructive sleep apnea) 10/21/2007  . NEPHROLITHIASIS, HX OF 10/21/2007  . GERD (gastroesophageal reflux disease) 06/09/2007  . Benign prostatic hyperplasia 06/09/2007  . Morbid obesity 06/03/2007  . Depression 06/03/2007  . Essential hypertension 06/03/2007  . INSOMNIA 06/03/2007    Current Outpatient Medications:  .  acetaminophen (TYLENOL) 500 MG tablet, Take 500-1,000 mg by mouth every 6 (six) hours as needed for mild pain or headache., Disp: , Rfl:  .  albuterol (VENTOLIN HFA) 108 (90 Base) MCG/ACT inhaler, INHALE 2 PUFFS BY MOUTH 4 TIMES A DAY AS NEEDED, Disp: 54 each, Rfl: 3 .  ALPRAZolam (XANAX) 0.5 MG tablet, TAKE 2 TABLETS (1 MG TOTAL) BY MOUTH EVERY DAY AT BEDTIME, Disp: 60 tablet, Rfl: 5 .  ammonium lactate (AMLACTIN) 12 % cream, Apply 1 Application topically as needed for dry skin., Disp: 385 g, Rfl: 3 .  apixaban (ELIQUIS) 5 MG TABS tablet, Take 5 mg by mouth daily., Disp: , Rfl:  .  Aspirin-Acetaminophen-Caffeine (GOODY HEADACHE PO), Take 1 packet by mouth every 6 (six) hours as needed (for pain)., Disp: , Rfl:  .  augmented betamethasone dipropionate (DIPROLENE-AF) 0.05 % cream, Apply 1 application  topically daily as needed (to affected areas- for itching)., Disp: , Rfl:  .  carisoprodol (SOMA) 350 MG tablet, TAKE 1 TABLET BY MOUTH THREE TIMES A DAY AS NEEDED FOR MUSCLE SPASM, Disp: 90 tablet, Rfl: 2 .  carvedilol (COREG) 6.25 MG tablet, TAKE 1 TABLET BY MOUTH TWICE A DAY, Disp: 180 tablet, Rfl: 3 .  Cholecalciferol 100 MCG (4000 UT) CAPS, 1 tab by mouth once daily, Disp: 30 capsule, Rfl: 99 .  clobetasol cream (TEMOVATE) 0.05 %, Apply topically as needed., Disp: , Rfl:  .  doxepin (SINEQUAN) 10 MG capsule, TAKE 2 CAPSULES (20 MG TOTAL) BY MOUTH AT BEDTIME AS NEEDED., Disp: 180 capsule, Rfl: 1 .  DULoxetine (CYMBALTA) 60 MG capsule,  Take 60 mg by mouth daily., Disp: , Rfl:  .  eplerenone (INSPRA) 25 MG tablet, Take 1 tablet (25 mg total) by mouth daily., Disp: 30 tablet, Rfl: 8 .  fluocinonide cream (LIDEX) 0.05 %, Apply topically as needed., Disp: , Rfl:  .  fluticasone (FLONASE) 50 MCG/ACT nasal spray, PLACE 2 SPRAYS INTO BOTH NOSTRILS DAILY AS NEEDED FOR ALLERGIES OR RHINITIS., Disp: 48 mL, Rfl: 1 .  hydrOXYzine (ATARAX) 10 MG tablet, TAKE 1 TABLET BY MOUTH 3 TIMES DAILY AS NEEDED FOR ITCHING. Strength: 10 mg, Disp: 60 tablet, Rfl: 2 .  ipratropium-albuterol (DUONEB) 0.5-2.5 (3) MG/3ML SOLN, TAKE 3 ML BY NEBULIZATION EVERY 4 HOURS AS NEEDED (WHEEZING). (Patient taking differently: Take 3 mLs by nebulization every 4 (four) hours as needed (for wheezing or COPD symptoms).), Disp: 360 mL, Rfl: 0 .  ketoconazole (NIZORAL) 2 % cream, Apply 1 Application topically daily. (Patient taking differently: Apply 1 Application topically daily as needed for irritation.), Disp: 120 g, Rfl: 1 .  KLOR-CON M20 20 MEQ tablet, TAKE 1 TABLET BY MOUTH EVERY DAY (Patient taking differently: Take 40 mEq by mouth daily.), Disp: 90 tablet, Rfl: 3 .  losartan (COZAAR) 25 MG tablet, Take 1 tablet (25 mg total) by mouth daily., Disp: 90 tablet, Rfl: 3 .  omeprazole (PRILOSEC) 20 MG capsule, TAKE 1 CAPSULE BY MOUTH TWICE A DAY, Disp: 180 capsule, Rfl: 1 .  ondansetron (ZOFRAN)  4 MG tablet, Take 1 tablet (4 mg total) by mouth every 6 (six) hours as needed for nausea., Disp: 40 tablet, Rfl: 0 .  predniSONE (DELTASONE) 10 MG tablet, Take 1 tablet (10 mg total) by mouth daily with breakfast., Disp: 30 tablet, Rfl: 0 .  promethazine (PHENERGAN) 25 MG tablet, Take 1 tablet (25 mg total) by mouth every 8 (eight) hours as needed for nausea or vomiting., Disp: 30 tablet, Rfl: 0 .  Respiratory Therapy Supplies (FULL KIT NEBULIZER SET) MISC, 1 each by Does not apply route as needed., Disp: 1 each, Rfl: 0 .  silver sulfADIAZINE (SILVADENE) 1 % cream, APPLY TOPICALLY TO  AFFECTED AREA EVERY DAY (Patient taking differently: Apply 1 Application topically daily as needed (to wound sites that won't heal).), Disp: 50 g, Rfl: 3 .  tamsulosin (FLOMAX) 0.4 MG CAPS capsule, TAKE 1 CAPSULE BY MOUTH EVERY DAY (Patient taking differently: Take 0.4 mg by mouth every evening.), Disp: 90 capsule, Rfl: 3 .  torsemide (DEMADEX) 20 MG tablet, Take 5 tablets (100 mg total) by mouth every morning AND 4 tablets (80 mg total) every evening., Disp: 810 tablet, Rfl: 3 .  traMADol (ULTRAM) 50 MG tablet, TAKE 1 TABLET BY MOUTH EVERY DAY AS NEEDED (Patient taking differently: Take 25 mg by mouth daily as needed for moderate pain.), Disp: 30 tablet, Rfl: 2 .  traZODone (DESYREL) 100 MG tablet, TAKE 2 TABLETS BY MOUTH AT BEDTIME AS NEEDED, Disp: 180 tablet, Rfl: 1 .  TUMS 500 MG chewable tablet, Chew 500-2,000 mg by mouth every 4 (four) hours as needed for indigestion or heartburn., Disp: , Rfl:  .  venlafaxine XR (EFFEXOR XR) 150 MG 24 hr capsule, Take 1 capsule (150 mg total) by mouth daily with breakfast., Disp: 90 capsule, Rfl: 3 .  zolpidem (AMBIEN) 10 MG tablet, TAKE 1 TABLET BY MOUTH EVERY DAY AT BEDTIME AS NEEDED, Disp: 90 tablet, Rfl: 1 .  Cholecalciferol (VITAMIN D3) 1000 units CAPS, Take 1,000 Units by mouth daily. (Patient not taking: Reported on 01/23/2023), Disp: , Rfl:  .  digoxin (LANOXIN) 0.125 MG tablet, Take 1 tablet by mouth daily., Disp: , Rfl:  .  Dupilumab (DUPIXENT) 300 MG/2ML SOPN, Inject 300 mg into the skin every 14 (fourteen) days. (Patient not taking: Reported on 01/23/2023), Disp: , Rfl:  .  Evolocumab (REPATHA SURECLICK) 140 MG/ML SOAJ, INJECT 1 PEN INTO THE SKIN EVERY 14 (FOURTEEN) DAYS. (Patient not taking: Reported on 01/23/2023), Disp: 6 mL, Rfl: 3 .  Fluticasone-Umeclidin-Vilant (TRELEGY ELLIPTA) 100-62.5-25 MCG/ACT AEPB, Inhale 1 puff into the lungs daily. (Patient not taking: Reported on 01/23/2023), Disp: 180 each, Rfl: 2 .  senna-docusate (SENOKOT-S) 8.6-50 MG  tablet, Take 1 tablet by mouth at bedtime as needed for mild constipation. (Patient not taking: Reported on 01/23/2023), Disp: 30 tablet, Rfl: 0 .  sertraline (ZOLOFT) 100 MG tablet, Take 100 mg by mouth daily. (Patient not taking: Reported on 01/23/2023), Disp: , Rfl:  Allergies  Allergen Reactions  . Tape Other (See Comments)    SKIN TEARS VERY, VERY EASILY!!!! Use Paper Tape Only, PLEASE!!!  . Amiodarone Other (See Comments)    Caused hyperthyroidism   . Statins Other (See Comments)    Myalgias   . Wound Dressing Adhesive Other (See Comments)    Skin Tears Use Paper Tape Only     Social History   Socioeconomic History  . Marital status: Divorced    Spouse name: Not on file  . Number of children: 2  .  Years of education: Not on file  . Highest education level: Not on file  Occupational History  . Occupation: retired/disabled. prev worked in Teacher, English as a foreign language.    Employer: DISABLED  Tobacco Use  . Smoking status: Former    Packs/day: 2.00    Years: 30.00    Additional pack years: 0.00    Total pack years: 60.00    Types: Cigarettes    Quit date: 10/16/2007    Years since quitting: 15.2  . Smokeless tobacco: Never  Vaping Use  . Vaping Use: Never used  Substance and Sexual Activity  . Alcohol use: No  . Drug use: No  . Sexual activity: Not Currently  Other Topics Concern  . Not on file  Social History Narrative   Lives alone.   Social Determinants of Health   Financial Resource Strain: Low Risk  (06/29/2022)   Overall Financial Resource Strain (CARDIA)   . Difficulty of Paying Living Expenses: Not hard at all  Food Insecurity: No Food Insecurity (11/26/2022)   Hunger Vital Sign   . Worried About Programme researcher, broadcasting/film/video in the Last Year: Never true   . Ran Out of Food in the Last Year: Never true  Transportation Needs: No Transportation Needs (11/26/2022)   PRAPARE - Transportation   . Lack of Transportation (Medical): No   . Lack of Transportation (Non-Medical): No   Physical Activity: Inactive (06/29/2022)   Exercise Vital Sign   . Days of Exercise per Week: 0 days   . Minutes of Exercise per Session: 0 min  Stress: No Stress Concern Present (06/29/2022)   [employer] of Occupational Health - Occupational Stress Questionnaire   . Feeling of Stress : Not at all  Social Connections: Moderately Integrated (06/29/2022)   Social Connection and Isolation Panel [NHANES]   . Frequency of Communication with Friends and Family: More than three times a week   . Frequency of Social Gatherings with Friends and Family: More than three times a week   . Attends Religious Services: More than 4 times per year   . Active Member of Clubs or Organizations: Yes   . Attends Banker Meetings: More than 4 times per year   . Marital Status: Divorced  Catering manager Violence: Not At Risk (11/22/2022)   Humiliation, Afraid, Rape, and Kick questionnaire   . Fear of Current or Ex-Partner: No   . Emotionally Abused: No   . Physically Abused: No   . Sexually Abused: No    Physical Exam      Future Appointments  Date Time Provider Department Center  02/13/2023  1:45 PM Luciano Cutter, MD [facility]  02/18/2023  1:30 PM [facility] PA/NP [facility] None  03/05/2023  1:45 PM Edwin Cap, DPM [facility]  03/12/2023  1:40 PM Laurey Morale, MD [facility] None  03/29/2023  1:00 PM Judi Saa, DO [facility] None  04/24/2023  9:05 AM [facility] DEVICE REMOTES [facility]  07/01/2023  1:20 PM [name], MD [facility] None  07/24/2023  9:05 AM [facility] DEVICE REMOTES [facility]  10/23/2023  9:05 AM [facility] DEVICE REMOTES [facility]  01/22/2024  9:05 AM [facility] DEVICE REMOTES [facility]  04/22/2024  9:05 AM [facility] DEVICE REMOTES [facility]

## 2023-01-24 ENCOUNTER — Other Ambulatory Visit (HOSPITAL_COMMUNITY): Payer: Self-pay

## 2023-01-24 ENCOUNTER — Telehealth (HOSPITAL_COMMUNITY): Payer: Self-pay | Admitting: Cardiology

## 2023-01-24 ENCOUNTER — Telehealth (HOSPITAL_COMMUNITY): Payer: Self-pay | Admitting: Licensed Clinical Social Worker

## 2023-01-24 ENCOUNTER — Telehealth: Payer: Self-pay

## 2023-01-24 ENCOUNTER — Encounter: Payer: Self-pay | Admitting: Pharmacist

## 2023-01-24 ENCOUNTER — Telehealth: Payer: Self-pay | Admitting: Pharmacist

## 2023-01-24 DIAGNOSIS — E7849 Other hyperlipidemia: Secondary | ICD-10-CM

## 2023-01-24 DIAGNOSIS — I2510 Atherosclerotic heart disease of native coronary artery without angina pectoris: Secondary | ICD-10-CM

## 2023-01-24 DIAGNOSIS — I5022 Chronic systolic (congestive) heart failure: Secondary | ICD-10-CM

## 2023-01-24 MED ORDER — REPATHA SURECLICK 140 MG/ML ~~LOC~~ SOAJ
1.0000 | SUBCUTANEOUS | 3 refills | Status: DC
Start: 2023-01-24 — End: 2024-02-10

## 2023-01-24 MED ORDER — APIXABAN 5 MG PO TABS: 5.0000 mg | ORAL_TABLET | Freq: Two times a day (BID) | ORAL | 11 refills | Status: DC

## 2023-01-24 MED ORDER — DIGOXIN 125 MCG PO TABS: 0.1250 mg | ORAL_TABLET | Freq: Every day | ORAL | 11 refills | Status: DC

## 2023-01-24 NOTE — Telephone Encounter (Signed)
Spoke with Pt.  He is unable to come to clinic today, but he can come tomorrow.  Scheduled for device check 01/25/2023 at 2:00 pm.

## 2023-01-24 NOTE — Telephone Encounter (Signed)
Alert received from [employer]:  Scheduled remote reviewed. Normal device function.   BiV Pacing 99.9%.  Effective Pacing 43.2%.  Effective Pacing was previously 83.8% on 10/24/22, 83.6% on 01/13/23.  Effective CRT episodes c/w intermittent LV LOC (see page 28 of PDF).  LV Capture Threshold is programmed off.  Triaged to clinic for review.    Per review of transmission-appears Pt may be intermittently not capturing on his LV lead.  Will have Pt come in for device testing.  Attempted to contact Pt.  Left message requesting call back.

## 2023-01-24 NOTE — Telephone Encounter (Signed)
[facility] with para medicine came by office to request refill of digoxin, eliquis updated to twice daily on med list and samples with Patient assistance application is completed   Medication Samples have been provided to the patient.  Drug name: eliquis       Strength: 5 mg        Qty: 56  LOT: [id number]  Exp.Date: 03/2024  Dosing instructions: ONE TAB TWICE A DAY  The patient has been instructed regarding the correct time, dose, and frequency of taking this medication, including desired effects and most common side effects.   Magda Bernheim M 10:01 AM 01/24/2023

## 2023-01-24 NOTE — Progress Notes (Signed)
EPIC Encounter for ICM Monitoring  Patient Name: [name] is a 67 y.o. male Date: 01/24/2023 Primary Care Physican: [name], MD Primary Cardiologist: Shirlee Latch Electrophysiologist: Joycelyn Schmid Pacing: 99.9%    10/02/2022 Weight: 265 lbs 01/24/2023 Weight: 260 lbs     Spoke with patient and heart failure questions reviewed.  Transmission results reviewed.  Pt asymptomatic for fluid accumulation.  Reports decrease in urine output since increasing Torsemide and is drinking fluids to stay hydrated.     Optivol thoracic impedance suggesting possible dryness starting 3/20 but trending back toward baseline normal.   Prescribed:  Torsemide 20 mg 5 tablet(s) (100 mg total) by mouth and 4 tablets (80 mg) every evening (increased 4/4).   Potassium 20 mEq take 1 tablet (20 mEq total) daily   Labs: 01/15/2023 Creatinine 1.38, BUN 22, Potassium 4.6, Sodium 134, GFR 52.70  12/31/2022 Creatinine 1.27, BUN 36, Potassium 5.2, Sodium 136, GFR 58.24  12/18/2022 Creatinine 1.13, BUN 36, Potassium 4.5, Sodium 135  11/24/2022 Creatinine 1.45, BUN 45, Potassium 3.2, Sodium 135, GFR 53 (12:09 PM) 11/24/2022 Creatinine 1.59, BUN 51, Potassium 4.0, Sodium 138, GFR 47 (6:01 AM) A complete set of results can be found in Results Review.   Recommendations: No changes and encouraged to call if experiencing any fluid symptoms.    Follow-up plan: ICM clinic phone appointment on 02/04/2023 to recheck fluid levels.   91 day device clinic remote transmission 04/24/2023.   EP/Cardiology Office Visits:    Recall 11/28/2023 with Dr Graciela Husbands.  02/18/2023 with HF clinic.   Copy of ICM check sent to Dr. Graciela Husbands and Robbie Lis, PA at HF clinic regarding slight possible dryness.     3 month ICM trend: 01/23/2023.    12-14 Month ICM trend:     Karie Soda, RN 01/24/2023 11:32 AM

## 2023-01-24 NOTE — Telephone Encounter (Signed)
Received message from [place], Child psychotherapist that pt needs financial assistance for his Repatha. I have enrolled him in the [employer]. I left a message for pt about this and have sent him grant info via [employer].

## 2023-01-24 NOTE — Telephone Encounter (Signed)
HF Paramedicine Team Based Care Meeting  One Month Post Enrollment Assessment  HF MD- NA  HF NP - Amy Clegg NP-C   [facility] HF Paramedicine  Katie Vicente Males  [facility] admit within the last 30 days for heart failure? no  Medications concerns? Do not believe that he is being compliant despite him reporting so.  Has been out of meds due to cost- paramedic getting applications from pharmacy team to help enroll in program.  Transportation issues? No- patient drives  Education needs? Patient seems to know a lot about HF and about his medications despite non compliance  SDOH concerns? no  [facility] Referrals Placed: pharmacy consult for polypharmacy - patient prescribed tramadol, xanax, soma, doxepin, hydroxyzine, ambien, and trazodone all at night  Barriers to discharge? Still helping to assess current level of compliance with meds and to help eliminate cost barrier to obtaining several of his medications.     Burna Sis, LCSW Clinical Social Worker [facility] Failure [facility] Desk#: [phone] Cell#: [phone]

## 2023-01-25 ENCOUNTER — Emergency Department (HOSPITAL_COMMUNITY): Payer: Medicare HMO

## 2023-01-25 ENCOUNTER — Other Ambulatory Visit (HOSPITAL_COMMUNITY): Payer: Self-pay | Admitting: Emergency Medicine

## 2023-01-25 ENCOUNTER — Emergency Department (HOSPITAL_COMMUNITY)
Admission: EM | Admit: 2023-01-25 | Discharge: 2023-01-26 | Disposition: A | Discharge status: Home / Self Care | Payer: Medicare HMO | Attending: Emergency Medicine | Admitting: Emergency Medicine

## 2023-01-25 ENCOUNTER — Telehealth: Payer: Self-pay | Admitting: Pharmacist

## 2023-01-25 ENCOUNTER — Ambulatory Visit: Payer: Medicare HMO

## 2023-01-25 ENCOUNTER — Encounter (HOSPITAL_COMMUNITY): Payer: Self-pay

## 2023-01-25 ENCOUNTER — Ambulatory Visit: Payer: Medicare HMO | Attending: Internal Medicine | Admitting: Internal Medicine

## 2023-01-25 ENCOUNTER — Other Ambulatory Visit: Payer: Self-pay

## 2023-01-25 VITALS — BP 94/68 | HR 73 | Temp 98.1°F | Resp 16

## 2023-01-25 DIAGNOSIS — R0600 Dyspnea, unspecified: Secondary | ICD-10-CM | POA: Diagnosis not present

## 2023-01-25 DIAGNOSIS — R079 Chest pain, unspecified: Secondary | ICD-10-CM | POA: Diagnosis not present

## 2023-01-25 DIAGNOSIS — I951 Orthostatic hypotension: Secondary | ICD-10-CM | POA: Diagnosis not present

## 2023-01-25 DIAGNOSIS — Z95810 Presence of automatic (implantable) cardiac defibrillator: Secondary | ICD-10-CM | POA: Diagnosis not present

## 2023-01-25 DIAGNOSIS — I5022 Chronic systolic (congestive) heart failure: Secondary | ICD-10-CM

## 2023-01-25 DIAGNOSIS — I5020 Unspecified systolic (congestive) heart failure: Secondary | ICD-10-CM | POA: Diagnosis not present

## 2023-01-25 DIAGNOSIS — Z5321 Procedure and treatment not carried out due to patient leaving prior to being seen by health care provider: Secondary | ICD-10-CM | POA: Insufficient documentation

## 2023-01-25 DIAGNOSIS — R0789 Other chest pain: Secondary | ICD-10-CM | POA: Diagnosis not present

## 2023-01-25 DIAGNOSIS — R0602 Shortness of breath: Secondary | ICD-10-CM | POA: Diagnosis not present

## 2023-01-25 LAB — CBC
HCT: 41.7 % (ref 39.0–52.0)
Hemoglobin: 13.7 g/dL (ref 13.0–17.0)
MCH: 31.6 pg (ref 26.0–34.0)
MCHC: 32.9 g/dL (ref 30.0–36.0)
MCV: 96.3 fL (ref 80.0–100.0)
Platelets: 237 10*3/uL (ref 150–400)
RBC: 4.33 MIL/uL (ref 4.22–5.81)
RDW: 13.6 % (ref 11.5–15.5)
WBC: 9 10*3/uL (ref 4.0–10.5)
nRBC: 0 % (ref 0.0–0.2)

## 2023-01-25 LAB — BASIC METABOLIC PANEL
Anion gap: 14 (ref 5–15)
BUN: 37 mg/dL — ABNORMAL HIGH (ref 8–23)
CO2: 26 mmol/L (ref 22–32)
Calcium: 9.4 mg/dL (ref 8.9–10.3)
Chloride: 98 mmol/L (ref 98–111)
Creatinine, Ser: 1.22 mg/dL (ref 0.61–1.24)
GFR, Estimated: >60 mL/min (ref >60)
Glucose, Bld: 131 mg/dL — ABNORMAL HIGH (ref 70–99)
Potassium: 3.6 mmol/L (ref 3.5–5.1)
Sodium: 138 mmol/L (ref 135–145)

## 2023-01-25 LAB — TROPONIN I (HIGH SENSITIVITY)
Troponin I (High Sensitivity): 10 ng/L (ref <18)
Troponin I (High Sensitivity): 10 ng/L (ref <18)

## 2023-01-25 LAB — BRAIN NATRIURETIC PEPTIDE: B Natriuretic Peptide: 28.5 pg/mL (ref 0.0–100.0)

## 2023-01-25 NOTE — ED Triage Notes (Signed)
Pt came in via POV d/t central CP that has been going on a few days, denies radiation. A/Ox4, has been taking acid reflux at home to help with no relief. Rates pain 3/10 while in triage, does endorse some SOB.

## 2023-01-25 NOTE — ED Notes (Signed)
Called pt x2 with no response, moved OTF

## 2023-01-25 NOTE — Progress Notes (Signed)
He was      Patient Care Team: [name], MD as PCP - General (Internal Medicine) Laurey Morale, MD as PCP - Advanced Heart Failure (Cardiology) Duke Salvia, MD as PCP - Electrophysiology (Cardiology) Himmelrich, Loree Fee, RD (Inactive) as Dietitian Duke Salvia, MD as Consulting Physician (Cardiology) Kathryne Hitch, MD as Consulting Physician (Orthopedic Surgery) Levy Pupa, MD as Referring Physician (Ophthalmology)   HPI  [name] is a 67 y.o. male seen in followup for CRT Medtronic  Implantation 2018     Had had syncope.  ILR had documented pauses greater than 6 seconds.  Not withstanding his right bundle branch block, it was very wide and so discussions with Dr. DM we elected CRT-D implantation.    He also has atrial fibrillation for which he was treated with amiodarone.  Having had recurrent and persistence of atrial fibrillation he underwent AV junction ablation following CRT implantation  He uses oxygen but not CPAP for his sleep apnea   Interval hospitalization with lower extremity cellulitis treated with cephalosporins  following discharge he has had recurrence of weeping from his right leg.  And has bilateral lower extremity erythema  Also asked pain underneath his left breast, aggravated by coughing tender to touch.  Saw the sports medicine doctor who did an ultrasound and said there is dried blood underneath.  No recollection of trauma or coughing  DATE TEST EF    /2012 myoview   44 Prior IMI perfsion defect  /2015 myoview    57% Perfusion defect--MI vs bowel  2016 myoview 35-40 NO perfusion defect  6/16 Echo 40-45 Severe BAE  6/17 Cath 30 no CAD   3/18 Echo 20-25%   4/18 LHC/RHC  No CAD  6/19  Echo  20-25%   1/21 Echo  35-40   1/24 Echo  40%    Antiarrhythmics Date  dofetilide 2013  amidoarone 2016   Date Cr K Dig Hgb  8/18 1.22 4.5  12.2  2/19  1.09 4.1 0.4   2/20 1.32 4.5    3/21 1.08 4.0  13.1  2/24 1.45 3.2  13.2        Past Medical History:  Diagnosis Date   AICD (automatic cardioverter/defibrillator) present    Anemia    supposed to be taking Vit B but doesn't   ANXIETY    takes Xanax nightly   Arthritis    Asthma    Albuterol prn and Advair daily;also takes Prednisone daily   Cardiomyopathy    a. EF 25% TEE July 2013; b. EF normalized 2015;  c. 03/2015 Echo: EF 40-45%, difrf HK, PASP 38 mmHg, Mild MR, sev LAE/RAE.   Chronic constipation    takes OTC stool softener   COPD (chronic obstructive pulmonary disease)    "one dr says COPD; one dr says emphysema" (09/18/2017)   COVID-19 12/09/2019   DEPRESSION    takes Zoloft and Doxepin daily   Diverticulitis    DYSKINESIA, ESOPHAGUS    Essential hypertension        FIBROMYALGIA    GERD (gastroesophageal reflux disease)        Glaucoma    HYPERLIPIDEMIA    a. Intolerant to statins.   INSOMNIA    takes Ambien nightly   Myocardial infarction    a. 2012 Myoview notable for prior infarct;  b. 03/2015 Lexiscan CL: EF 37%, diff HK, small area of inferior infarct from apex to base-->Med Rx.   O2 dependent    "  2.5L q hs & prn" (09/18/2017)   Paroxysmal atrial fibrillation    a. CHA2DS2VASc = 3--> takes Coumadin;  b. 03/15/2015 Successful TEE/DCCV;  c. 03/2015 recurrent afib, Amio d/c'd in setting of hyperthyroidism.   Peripheral neuropathy    Pneumonia 12/2016   Rash and other nonspecific skin eruption 04/12/2009   no cause found saw dermatologists x 2 and allergist   SLEEP APNEA, OBSTRUCTIVE    a. doesn't use CPAP   Syncope    a. 03/2015 s/p MDT LINQ.   Type II diabetes mellitus         Past Surgical History:  Procedure Laterality Date   ACNE CYST REMOVAL     2 on back    AV NODE ABLATION N/A 10/25/2017   Procedure: AV NODE ABLATION;  Surgeon: Duke Salvia, MD;  Location: [facility] INVASIVE CV LAB;  Service: Cardiovascular;  Laterality: N/A;   BIV ICD INSERTION CRT-D N/A 09/18/2017   Procedure: BIV ICD INSERTION CRT-D;  Surgeon: Duke Salvia, MD;  Location: [facility] INVASIVE CV LAB;  Service: Cardiovascular;  Laterality: N/A;   CARDIAC CATHETERIZATION N/A 03/21/2016   Procedure: Right/Left Heart Cath and Coronary Angiography;  Surgeon: Laurey Morale, MD;  Location: [facility] INVASIVE CV LAB;  Service: Cardiovascular;  Laterality: N/A;   CARDIOVERSION  04/18/2012   Procedure: CARDIOVERSION;  Surgeon: Pricilla Riffle, MD;  Location: [facility] OR;  Service: Cardiovascular;  Laterality: N/A;   CARDIOVERSION  04/25/2012   Procedure: CARDIOVERSION;  Surgeon: Vesta Mixer, MD;  Location: [facility] ENDOSCOPY;  Service: Cardiovascular;  Laterality: N/A;   CARDIOVERSION  04/25/2012   Procedure: CARDIOVERSION;  Surgeon: Pricilla Riffle, MD;  Location: [facility] OR;  Service: Cardiovascular;  Laterality: N/A;   CARDIOVERSION  05/09/2012   Procedure: CARDIOVERSION;  Surgeon: Tonny Bollman, MD;  Location: [facility] OR;  Service: Cardiovascular;  Laterality: N/A;  changed from crenshaw to cooper by trish/leone-endo   CARDIOVERSION N/A 03/15/2015   Procedure: CARDIOVERSION;  Surgeon: Vesta Mixer, MD;  Location: [facility] ENDOSCOPY;  Service: Cardiovascular;  Laterality: N/A;   COLONOSCOPY     COLONOSCOPY WITH PROPOFOL N/A 10/21/2014   Procedure: COLONOSCOPY WITH PROPOFOL;  Surgeon: Meryl Dare, MD;  Location: [facility] ENDOSCOPY;  Service: Endoscopy;  Laterality: N/A;   EP IMPLANTABLE DEVICE N/A 04/06/2015   Procedure: Loop Recorder Insertion;  Surgeon: Marinus Maw, MD;  Location: [facility] INVASIVE CV LAB;  Service: Cardiovascular;  Laterality: N/A;   ESOPHAGOGASTRODUODENOSCOPY     JOINT REPLACEMENT     LOOP RECORDER REMOVAL N/A 09/18/2017   Procedure: LOOP RECORDER REMOVAL;  Surgeon: Duke Salvia, MD;  Location: [facility] INVASIVE CV LAB;  Service: Cardiovascular;  Laterality: N/A;   RIGHT/LEFT HEART CATH AND CORONARY ANGIOGRAPHY N/A 01/28/2017   Procedure: Right/Left Heart Cath and Coronary Angiography;  Surgeon: Laurey Morale, MD;  Location: [facility] INVASIVE CV LAB;  Service: Cardiovascular;   Laterality: N/A;   TEE WITHOUT CARDIOVERSION  04/25/2012   Procedure: TRANSESOPHAGEAL ECHOCARDIOGRAM (TEE);  Surgeon: Vesta Mixer, MD;  Location: [facility] ENDOSCOPY;  Service: Cardiovascular;  Laterality: N/A;   TEE WITHOUT CARDIOVERSION N/A 03/15/2015   Procedure: TRANSESOPHAGEAL ECHOCARDIOGRAM (TEE);  Surgeon: Vesta Mixer, MD;  Location: [facility] ENDOSCOPY;  Service: Cardiovascular;  Laterality: N/A;   TONSILLECTOMY AND ADENOIDECTOMY     TOTAL KNEE ARTHROPLASTY Right 06/15/2014   Procedure: TOTAL KNEE ARTHROPLASTY;  Surgeon: Sheral Apley, MD;  Location: [facility] OR;  Service: Orthopedics;  Laterality: Right;   TOTAL KNEE ARTHROPLASTY Left 10/17/2021  Procedure: Left TOTAL KNEE ARTHROPLASTY;  Surgeon: Kathryne Hitch, MD;  Location: [facility] OR;  Service: Orthopedics;  Laterality: Left;    Current Outpatient Medications  Medication Sig Dispense Refill   acetaminophen (TYLENOL) 500 MG tablet Take 500-1,000 mg by mouth every 6 (six) hours as needed for mild pain or headache.     albuterol (VENTOLIN HFA) 108 (90 Base) MCG/ACT inhaler INHALE 2 PUFFS BY MOUTH 4 TIMES A DAY AS NEEDED 54 each 3   ALPRAZolam (XANAX) 0.5 MG tablet TAKE 2 TABLETS (1 MG TOTAL) BY MOUTH EVERY DAY AT BEDTIME 60 tablet 5   ammonium lactate (AMLACTIN) 12 % cream Apply 1 Application topically as needed for dry skin. 385 g 3   apixaban (ELIQUIS) 5 MG TABS tablet Take 1 tablet (5 mg total) by mouth 2 (two) times daily. 60 tablet 11   Aspirin-Acetaminophen-Caffeine (GOODY HEADACHE PO) Take 1 packet by mouth every 6 (six) hours as needed (for pain).     augmented betamethasone dipropionate (DIPROLENE-AF) 0.05 % cream Apply 1 application  topically daily as needed (to affected areas- for itching).     carisoprodol (SOMA) 350 MG tablet TAKE 1 TABLET BY MOUTH THREE TIMES A DAY AS NEEDED FOR MUSCLE SPASM 90 tablet 2   carvedilol (COREG) 6.25 MG tablet TAKE 1 TABLET BY MOUTH TWICE A DAY 180 tablet 3   Cholecalciferol (VITAMIN D3) 1000 units  CAPS Take 1,000 Units by mouth daily. (Patient not taking: Reported on 01/23/2023)     Cholecalciferol 100 MCG (4000 UT) CAPS 1 tab by mouth once daily 30 capsule 99   clobetasol cream (TEMOVATE) 0.05 % Apply topically as needed.     digoxin (LANOXIN) 0.125 MG tablet Take 1 tablet (0.125 mg total) by mouth daily. 30 tablet 11   doxepin (SINEQUAN) 10 MG capsule TAKE 2 CAPSULES (20 MG TOTAL) BY MOUTH AT BEDTIME AS NEEDED. 180 capsule 1   DULoxetine (CYMBALTA) 60 MG capsule Take 60 mg by mouth daily.     Dupilumab (DUPIXENT) 300 MG/2ML SOPN Inject 300 mg into the skin every 14 (fourteen) days. (Patient not taking: Reported on 01/23/2023)     eplerenone (INSPRA) 25 MG tablet Take 1 tablet (25 mg total) by mouth daily. 30 tablet 8   Evolocumab (REPATHA SURECLICK) 140 MG/ML SOAJ Inject 140 mg into the skin every 14 (fourteen) days. 6 mL 3   fluocinonide cream (LIDEX) 0.05 % Apply topically as needed.     fluticasone (FLONASE) 50 MCG/ACT nasal spray PLACE 2 SPRAYS INTO BOTH NOSTRILS DAILY AS NEEDED FOR ALLERGIES OR RHINITIS. 48 mL 1   Fluticasone-Umeclidin-Vilant (TRELEGY ELLIPTA) 100-62.5-25 MCG/ACT AEPB Inhale 1 puff into the lungs daily. (Patient not taking: Reported on 01/23/2023) 180 each 2   hydrOXYzine (ATARAX) 10 MG tablet TAKE 1 TABLET BY MOUTH 3 TIMES DAILY AS NEEDED FOR ITCHING. Strength: 10 mg 60 tablet 2   ipratropium-albuterol (DUONEB) 0.5-2.5 (3) MG/3ML SOLN TAKE 3 ML BY NEBULIZATION EVERY 4 HOURS AS NEEDED (WHEEZING). (Patient taking differently: Take 3 mLs by nebulization every 4 (four) hours as needed (for wheezing or COPD symptoms).) 360 mL 0   ketoconazole (NIZORAL) 2 % cream Apply 1 Application topically daily. (Patient taking differently: Apply 1 Application topically daily as needed for irritation.) 120 g 1   KLOR-CON M20 20 MEQ tablet TAKE 1 TABLET BY MOUTH EVERY DAY (Patient taking differently: Take 40 mEq by mouth daily.) 90 tablet 3   losartan (COZAAR) 25 MG tablet Take 1 tablet (25  mg  total) by mouth daily. 90 tablet 3   omeprazole (PRILOSEC) 20 MG capsule TAKE 1 CAPSULE BY MOUTH TWICE A DAY 180 capsule 1   ondansetron (ZOFRAN) 4 MG tablet Take 1 tablet (4 mg total) by mouth every 6 (six) hours as needed for nausea. 40 tablet 0   predniSONE (DELTASONE) 10 MG tablet Take 1 tablet (10 mg total) by mouth daily with breakfast. 30 tablet 0   promethazine (PHENERGAN) 25 MG tablet Take 1 tablet (25 mg total) by mouth every 8 (eight) hours as needed for nausea or vomiting. 30 tablet 0   Respiratory Therapy Supplies (FULL KIT NEBULIZER SET) MISC 1 each by Does not apply route as needed. 1 each 0   senna-docusate (SENOKOT-S) 8.6-50 MG tablet Take 1 tablet by mouth at bedtime as needed for mild constipation. (Patient not taking: Reported on 01/23/2023) 30 tablet 0   sertraline (ZOLOFT) 100 MG tablet Take 100 mg by mouth daily. (Patient not taking: Reported on 01/23/2023)     silver sulfADIAZINE (SILVADENE) 1 % cream APPLY TOPICALLY TO AFFECTED AREA EVERY DAY (Patient taking differently: Apply 1 Application topically daily as needed (to wound sites that won't heal).) 50 g 3   tamsulosin (FLOMAX) 0.4 MG CAPS capsule TAKE 1 CAPSULE BY MOUTH EVERY DAY (Patient taking differently: Take 0.4 mg by mouth every evening.) 90 capsule 3   torsemide (DEMADEX) 20 MG tablet Take 5 tablets (100 mg total) by mouth every morning AND 4 tablets (80 mg total) every evening. 810 tablet 3   traMADol (ULTRAM) 50 MG tablet TAKE 1 TABLET BY MOUTH EVERY DAY AS NEEDED (Patient taking differently: Take 25 mg by mouth daily as needed for moderate pain.) 30 tablet 2   traZODone (DESYREL) 100 MG tablet TAKE 2 TABLETS BY MOUTH AT BEDTIME AS NEEDED 180 tablet 1   TUMS 500 MG chewable tablet Chew 500-2,000 mg by mouth every 4 (four) hours as needed for indigestion or heartburn.     venlafaxine XR (EFFEXOR XR) 150 MG 24 hr capsule Take 1 capsule (150 mg total) by mouth daily with breakfast. 90 capsule 3   zolpidem (AMBIEN) 10  MG tablet TAKE 1 TABLET BY MOUTH EVERY DAY AT BEDTIME AS NEEDED 90 tablet 1   No current facility-administered medications for this visit.    Allergies  Allergen Reactions   Tape Other (See Comments)    SKIN TEARS VERY, VERY EASILY!!!! Use Paper Tape Only, PLEASE!!!   Amiodarone Other (See Comments)    Caused hyperthyroidism    Statins Other (See Comments)    Myalgias    Wound Dressing Adhesive Other (See Comments)    Skin Tears Use Paper Tape Only    Review of Systems negative except from HPI and PMH  Physical Exam BP 94/68 (BP Location: Left Arm, Patient Position: Sitting, Cuff Size: Large)   Pulse 73   Temp 98.1 F (36.7 C) (Oral)   Resp 16   SpO2 93%    Well developed and Morbidly obese in no acute distress HENT normal Neck supple Clear Device pocket well healed; without hematoma or erythema.  There is no tethering  Regular rate and rhythm, no  gallop /2/6 murmur Abd-soft with active BS No Clubbing cyanosis 2+_ edema Skin-warm and dry A & Oriented  Grossly normal sensory and motor function  ECG atrial fibrillation with ventricular pacing with a negative QRS lead I and RS in lead V1   Device function is normal. Programming changes   See Paceart for details  Assessment and  Plan  Ischemic heart disease without significant obstructive disease by catheterization 6/17  HFrEF    CRT-D-Medtronic   Cardiomyopathy-ischemic/nonischemic  Atrial fibrillation  Permanent   Complete heart block status post AV junction ablation  Syncope   Morbidly obese  Cellulitis  Chest wall pain

## 2023-01-25 NOTE — Progress Notes (Signed)
Spoke with patient and advised Robbie Lis, PA ordered lab work to check kidney function and potassium levels.  Pt will be at the [address] office today for device check. Advised to have BMET drawn at that office since he will already been there.   Appointment made for 4:15 today.

## 2023-01-25 NOTE — Progress Notes (Signed)
Per secure chat  

## 2023-01-25 NOTE — Telephone Encounter (Signed)
Diagnosis verification form for [employer] for [employer] has been faxed to [phone].

## 2023-01-25 NOTE — ED Provider Triage Note (Signed)
Emergency Medicine Provider Triage Evaluation Note  [name] , a 67 y.o. male  was evaluated in triage.  Complaining of chest pain for the past couple of days.  Worst in the middle, does not radiate.  Some dyspnea on exertion.  Reports a history of fluid overload, is on diuretics that have been helping him.   Review of Systems  Positive:  Negative:  Physical Exam  BP 96/65 (BP Location: Right Arm)   Pulse 70   Temp 98 F (36.7 C) (Oral)   Resp 16   Ht 5\' 10"  (1.778 m)   Wt 122 kg   SpO2 96%   BMI 38.60 kg/m  Gen:   Awake, no distress   Resp:  Normal effort  MSK:   Moves extremities without difficulty  Other:  RRR, CTAB  Medical Decision Making  Medically screening exam initiated at 5:22 PM.  Appropriate orders placed.  [name] was informed that the remainder of the evaluation will be completed by another provider, this initial triage assessment does not replace that evaluation, and the importance of remaining in the ED until their evaluation is complete.     Saddie Benders, PA-C 01/25/23 1723

## 2023-01-25 NOTE — Progress Notes (Signed)
Dropped off samples of Eliquis to Mr. Craddick girl friend at Dr. Odessa Fleming office along with [employer] for financial assistance with Eliquis.  Also supplied applications for Trelegy financial assistance for Mr. Dalia to fill out and sign.  Girl friend advised me that Dr. Odessa Fleming office was sending him to the hospital.  I will reach out next week to follow up on forms.    Beatrix Shipper, EMT-Paramedic [phone] 01/25/2023

## 2023-01-26 LAB — BASIC METABOLIC PANEL
BUN/Creatinine Ratio: 30 — ABNORMAL HIGH (ref 10–24)
BUN: 41 mg/dL — ABNORMAL HIGH (ref 8–27)
CO2: 26 mmol/L (ref 20–29)
Calcium: 9.5 mg/dL (ref 8.6–10.2)
Chloride: 98 mmol/L (ref 96–106)
Creatinine, Ser: 1.38 mg/dL — ABNORMAL HIGH (ref 0.76–1.27)
Glucose: 101 mg/dL — ABNORMAL HIGH (ref 70–99)
Potassium: 4.2 mmol/L (ref 3.5–5.2)
Sodium: 141 mmol/L (ref 134–144)
eGFR: 56 mL/min/{1.73_m2} — ABNORMAL LOW (ref >59)

## 2023-01-27 ENCOUNTER — Other Ambulatory Visit: Payer: Self-pay | Admitting: Internal Medicine

## 2023-01-27 DIAGNOSIS — J441 Chronic obstructive pulmonary disease with (acute) exacerbation: Secondary | ICD-10-CM

## 2023-01-28 NOTE — Addendum Note (Signed)
Addended by: Mariam Dollar on: 01/28/2023 08:42 AM   Modules accepted: Orders

## 2023-01-29 ENCOUNTER — Telehealth: Payer: Self-pay

## 2023-01-29 NOTE — Telephone Encounter (Signed)
     Patient  visit on 4/13  at [facility]   Have you been able to follow up with your primary care physician? Yes   The patient was or was not able to obtain any needed medicine or equipment. Yes   Are there diet recommendations that you are having difficulty following? Yes   Patient expresses understanding of discharge instructions and education provided has no other needs at this time.  Yes     Lenard Forth [facility] Guide, [facility] [phone] [address] [place], [place], [place] [address] Phone: [phone] Email: [email or web address]

## 2023-01-30 ENCOUNTER — Other Ambulatory Visit: Payer: Self-pay | Admitting: Internal Medicine

## 2023-01-30 ENCOUNTER — Telehealth: Payer: Self-pay

## 2023-01-30 ENCOUNTER — Encounter: Payer: Self-pay | Admitting: Family Medicine

## 2023-01-30 ENCOUNTER — Other Ambulatory Visit: Payer: Self-pay

## 2023-01-30 ENCOUNTER — Other Ambulatory Visit (HOSPITAL_COMMUNITY): Payer: Self-pay | Admitting: Emergency Medicine

## 2023-01-30 DIAGNOSIS — L308 Other specified dermatitis: Secondary | ICD-10-CM

## 2023-01-30 MED ORDER — PREDNISONE 10 MG PO TABS: 10.0000 mg | ORAL_TABLET | Freq: Every day | ORAL | 0 refills | Status: DC

## 2023-01-30 NOTE — Progress Notes (Signed)
   Care Guide Note  01/30/2023 Name: MARQUI DEHAVEN MRN: [id number] DOB: 05-31-1956  Referred by: [name], MD Reason for referral : Care Coordination (Outreach to schedule referral with pharm d )   [name] is a 67 y.o. year old male who is a primary care patient of [name], MD. [name] was referred to the pharmacist for assistance related to HTN.    Successful contact was made with the patient to discuss pharmacy services including being ready for the pharmacist to call at least 5 minutes before the scheduled appointment time, to have medication bottles and any blood sugar or blood pressure readings ready for review. The patient agreed to meet with the pharmacist via with the pharmacist via telephone visit on (date/time).  02/01/2023  Penne Lash, RMA Care Guide [facility]  [place], [place] [address] Direct Dial: [phone] [email or web address]

## 2023-01-30 NOTE — Progress Notes (Signed)
Paramedicine Encounter    Patient ID: [name], male    DOB: 26-Sep-1956, 67 y.o.   MRN: [id number]   Complaints NONE  Assessment A&O x 4, skin W&D w/ good color.  No peripheral edema noted and lung sounds are clear throughout.  Weight is up 13lbs  Compliance with meds missed 1 am and 1 pm dose out of a weeks regiment  Pill box filled does not wish to continue with pill box.  Refills needed Notes in narrative  Meds changes since last visit Notes in narrative    Social changes NONE   BP (!) 140/100 (BP Location: Left Arm, Patient Position: Sitting, Cuff Size: Normal)   Pulse 75   Resp 16   Wt 270 lb (122.5 kg)   SpO2 97%   BMI 38.74 kg/m  Weight yesterday- not taken Last visit weight-260lb  On arrival to home visit today [name] advised me that he appreciated all my hard work but he does not wish to continue taking his meds from pill box.  He states, "I've been taking them out of bottles for many years and I am know what I'm doing."  Discussed with him the benefits of of a pill box being that it give a visual reference to confirm med compliance and also gives a way to more accurately measure if the combination of meds and doses are working for him as they should.  He still advises he wants to take his meds HIS way.   Today was a 2 hour visit.  We reviewed each medicine individually and he was able to tell me how he was taking each medicine.  Discovered he has taken it upon himself to change his Torsemide from 100mg  in the morning and 80mg  in the evening to 80mg  in the morning and 80mg  in the evening.  He is up 13lbs from his last visit.  He agrees to go back to his prescribed doses of 100mg  & 80mg .  I have also discovered that it appears he has not been taking his Digoxin.  A refill was called in and pt states he will pick up same tomorrow. Assisted pt w/ financial application for assistance with his Trelegy and this will be sent to his pulmonologist.  I called [facility] to request a  print out of [name] out of pocket expense since Jan. 1st  till now in order to find out if he has spent at least 3% of his income on prescriptions as he needs financial assistance with his Eliquis.  He will pick up this print out tomorrow as well.  ACTION: Home visit completed  Bethanie Dicker [phone] 01/30/23  Patient Care Team: [name], MD as PCP - General (Internal Medicine) Laurey Morale, MD as PCP - Advanced Heart Failure (Cardiology) Duke Salvia, MD as PCP - Electrophysiology (Cardiology) Himmelrich, Loree Fee, RD (Inactive) as Dietitian Duke Salvia, MD as Consulting Physician (Cardiology) Kathryne Hitch, MD as Consulting Physician (Orthopedic Surgery) Levy Pupa, MD as Referring Physician (Ophthalmology)  Patient Active Problem List   Diagnosis Date Noted   Epigastric pain 01/16/2023   Nausea and vomiting 01/16/2023   Cardiac arrhythmia 01/16/2023   Cellulitis and abscess of leg, except foot 11/21/2022   Cellulitis of right lower extremity without foot 11/20/2022   Anxiety and depression 05/20/2022   AC (acromioclavicular) arthritis 04/24/2022   Cataract, nuclear sclerotic, left eye 03/07/2022   ED (erectile dysfunction) 01/03/2022   Intertrigo 12/24/2021  Gluteal tendinitis of left buttock 12/19/2021   Unilateral primary osteoarthritis, left knee 10/17/2021   Status post total knee replacement, left 10/17/2021   Rib pain on right side 10/10/2021   History of colonic polyps 09/26/2021   Bursitis of left hip 06/13/2021   Tooth fracture 06/11/2021   Pain and swelling of right lower leg 05/18/2021   Gastrointestinal hemorrhage    Diverticulitis of colon    Preop exam for internal medicine 03/04/2021   B12 deficiency 03/04/2021   Vitamin D deficiency 03/01/2021   Myalgia 12/01/2020   Laceration of left leg 09/17/2020   Statin myopathy 08/02/2020   Rotator cuff arthropathy of both shoulders 02/23/2020   (HFpEF) heart  failure with preserved ejection fraction 02/22/2020   ICD (implantable cardioverter-defibrillator) in place 12/27/2019   Elevated PSA 09/23/2019   Lip lesion 12/23/2018   Diabetic ulcer of left lower leg with necrosis of muscle 12/05/2018   Chronic bursitis of left shoulder 07/01/2018   Pain due to total knee replacement 03/31/2018   Rotator cuff arthropathy, left 02/13/2018   Right rotator cuff tear arthropathy 01/14/2018   Increased prostate specific antigen (PSA) velocity 09/26/2017   HFrEF (heart failure with reduced ejection fraction) 09/18/2017   Trigger point of thoracic region 07/04/2017   Right lumbar radiculopathy 05/15/2017   Right leg swelling 05/09/2017   Glaucoma 04/18/2017   History of nasal polyposis 02/19/2017   Chronic rhinitis 02/19/2017   Microhematuria 12/04/2016   Hyperlipidemia 08/15/2016   Encounter for well adult exam with abnormal findings 11/29/2015   Syncope 03/30/2015   Former tobacco use 02/25/2015   Type 2 diabetes mellitus 02/25/2015   Permanent atrial fibrillation 02/25/2015   Anemia 02/23/2015   Thyrotoxicosis 02/23/2015   Hx of adenomatous colonic polyps 10/21/2014   De Quervain's tenosynovitis, right 04/30/2014   Osteoarthritis of right knee 02/19/2014   Encounter for therapeutic drug monitoring 11/17/2013   Thoracic degenerative disc disease 06/17/2013   Thoracic spondylosis without myelopathy 02/11/2013   Lumbosacral spondylosis without myelopathy 02/11/2013   Degenerative joint disease of knee, left 02/11/2013   COPD (chronic obstructive pulmonary disease) 08/20/2012   Bundle branch block, right and extreme left anterior fascicular 05/05/2012   Nonischemic cardiomyopathy 05/02/2012   Anticoagulated on warfarin 03/21/2012   Spongiotic dermatitis 09/26/2011   Past myocardial infarction 04/19/2011   UNSPECIFIED URETHRAL STRICTURE 12/26/2010   Chronic pain syndrome 05/18/2010   OSA (obstructive sleep apnea) 10/21/2007   NEPHROLITHIASIS, HX  OF 10/21/2007   GERD (gastroesophageal reflux disease) 06/09/2007   Benign prostatic hyperplasia 06/09/2007   Morbid obesity 06/03/2007   Depression 06/03/2007   Essential hypertension 06/03/2007   INSOMNIA 06/03/2007    Current Outpatient Medications:    acetaminophen (TYLENOL) 500 MG tablet, Take 500-1,000 mg by mouth every 6 (six) hours as needed for mild pain or headache., Disp: , Rfl:    albuterol (VENTOLIN HFA) 108 (90 Base) MCG/ACT inhaler, INHALE 2 PUFFS BY MOUTH 4 TIMES A DAY AS NEEDED, Disp: 54 each, Rfl: 3   ALPRAZolam (XANAX) 0.5 MG tablet, TAKE 2 TABLETS (1 MG TOTAL) BY MOUTH EVERY DAY AT BEDTIME, Disp: 60 tablet, Rfl: 5   ammonium lactate (AMLACTIN) 12 % cream, Apply 1 Application topically as needed for dry skin., Disp: 385 g, Rfl: 3   apixaban (ELIQUIS) 5 MG TABS tablet, Take 1 tablet (5 mg total) by mouth 2 (two) times daily., Disp: 60 tablet, Rfl: 11   augmented betamethasone dipropionate (DIPROLENE-AF) 0.05 % cream, Apply 1 application  topically daily as  needed (to affected areas- for itching)., Disp: , Rfl:    carisoprodol (SOMA) 350 MG tablet, TAKE 1 TABLET BY MOUTH THREE TIMES A DAY AS NEEDED FOR MUSCLE SPASM, Disp: 90 tablet, Rfl: 2   carvedilol (COREG) 6.25 MG tablet, TAKE 1 TABLET BY MOUTH TWICE A DAY, Disp: 180 tablet, Rfl: 3   Cholecalciferol 100 MCG (4000 UT) CAPS, 1 tab by mouth once daily, Disp: 30 capsule, Rfl: 99   clobetasol cream (TEMOVATE) 0.05 %, Apply topically as needed., Disp: , Rfl:    doxepin (SINEQUAN) 10 MG capsule, TAKE 2 CAPSULES (20 MG TOTAL) BY MOUTH AT BEDTIME AS NEEDED., Disp: 180 capsule, Rfl: 1   DULoxetine (CYMBALTA) 60 MG capsule, Take 60 mg by mouth daily., Disp: , Rfl:    eplerenone (INSPRA) 25 MG tablet, Take 1 tablet (25 mg total) by mouth daily., Disp: 30 tablet, Rfl: 8   fluocinonide cream (LIDEX) 0.05 %, Apply topically as needed., Disp: , Rfl:    fluticasone (FLONASE) 50 MCG/ACT nasal spray, PLACE 2 SPRAYS INTO BOTH NOSTRILS DAILY  AS NEEDED FOR ALLERGIES OR RHINITIS., Disp: 48 mL, Rfl: 1   hydrOXYzine (ATARAX) 10 MG tablet, TAKE 1 TABLET BY MOUTH 3 TIMES DAILY AS NEEDED FOR ITCHING. Strength: 10 mg, Disp: 60 tablet, Rfl: 2   ipratropium-albuterol (DUONEB) 0.5-2.5 (3) MG/3ML SOLN, TAKE 3 ML BY NEBULIZATION EVERY 4 HOURS AS NEEDED (WHEEZING)., Disp: 360 mL, Rfl: 0   ketoconazole (NIZORAL) 2 % cream, Apply 1 Application topically daily. (Patient taking differently: Apply 1 Application topically daily as needed for irritation.), Disp: 120 g, Rfl: 1   KLOR-CON M20 20 MEQ tablet, TAKE 1 TABLET BY MOUTH EVERY DAY (Patient taking differently: Take 40 mEq by mouth daily.), Disp: 90 tablet, Rfl: 3   losartan (COZAAR) 25 MG tablet, Take 1 tablet (25 mg total) by mouth daily., Disp: 90 tablet, Rfl: 3   omeprazole (PRILOSEC) 20 MG capsule, TAKE 1 CAPSULE BY MOUTH TWICE A DAY, Disp: 180 capsule, Rfl: 1   ondansetron (ZOFRAN) 4 MG tablet, Take 1 tablet (4 mg total) by mouth every 6 (six) hours as needed for nausea., Disp: 40 tablet, Rfl: 0   promethazine (PHENERGAN) 25 MG tablet, Take 1 tablet (25 mg total) by mouth every 8 (eight) hours as needed for nausea or vomiting., Disp: 30 tablet, Rfl: 0   Respiratory Therapy Supplies (FULL KIT NEBULIZER SET) MISC, 1 each by Does not apply route as needed., Disp: 1 each, Rfl: 0   silver sulfADIAZINE (SILVADENE) 1 % cream, APPLY TOPICALLY TO AFFECTED AREA EVERY DAY (Patient taking differently: Apply 1 Application topically daily as needed (to wound sites that won't heal).), Disp: 50 g, Rfl: 3   tamsulosin (FLOMAX) 0.4 MG CAPS capsule, TAKE 1 CAPSULE BY MOUTH EVERY DAY (Patient taking differently: Take 0.4 mg by mouth every evening.), Disp: 90 capsule, Rfl: 3   torsemide (DEMADEX) 20 MG tablet, Take 5 tablets (100 mg total) by mouth every morning AND 4 tablets (80 mg total) every evening., Disp: 810 tablet, Rfl: 3   traMADol (ULTRAM) 50 MG tablet, TAKE 1 TABLET BY MOUTH EVERY DAY AS NEEDED, Disp: 30  tablet, Rfl: 2   traZODone (DESYREL) 100 MG tablet, TAKE 2 TABLETS BY MOUTH AT BEDTIME AS NEEDED, Disp: 180 tablet, Rfl: 1   TUMS 500 MG chewable tablet, Chew 500-2,000 mg by mouth every 4 (four) hours as needed for indigestion or heartburn., Disp: , Rfl:    venlafaxine XR (EFFEXOR XR) 150 MG 24 hr  capsule, Take 1 capsule (150 mg total) by mouth daily with breakfast., Disp: 90 capsule, Rfl: 3   zolpidem (AMBIEN) 10 MG tablet, TAKE 1 TABLET BY MOUTH EVERY DAY AT BEDTIME AS NEEDED, Disp: 90 tablet, Rfl: 1   Aspirin-Acetaminophen-Caffeine (GOODY HEADACHE PO), Take 1 packet by mouth every 6 (six) hours as needed (for pain). (Patient not taking: Reported on 01/30/2023), Disp: , Rfl:    Cholecalciferol (VITAMIN D3) 1000 units CAPS, Take 1,000 Units by mouth daily. (Patient not taking: Reported on 01/23/2023), Disp: , Rfl:    digoxin (LANOXIN) 0.125 MG tablet, Take 1 tablet (0.125 mg total) by mouth daily. (Patient not taking: Reported on 01/30/2023), Disp: 30 tablet, Rfl: 11   Dupilumab (DUPIXENT) 300 MG/2ML SOPN, Inject 300 mg into the skin every 14 (fourteen) days. (Patient not taking: Reported on 01/23/2023), Disp: , Rfl:    Evolocumab (REPATHA SURECLICK) 140 MG/ML SOAJ, Inject 140 mg into the skin every 14 (fourteen) days., Disp: 6 mL, Rfl: 3   Fluticasone-Umeclidin-Vilant (TRELEGY ELLIPTA) 100-62.5-25 MCG/ACT AEPB, Inhale 1 puff into the lungs daily. (Patient not taking: Reported on 01/23/2023), Disp: 180 each, Rfl: 2   predniSONE (DELTASONE) 10 MG tablet, Take 1 tablet (10 mg total) by mouth daily with breakfast., Disp: 30 tablet, Rfl: 0   senna-docusate (SENOKOT-S) 8.6-50 MG tablet, Take 1 tablet by mouth at bedtime as needed for mild constipation. (Patient not taking: Reported on 01/23/2023), Disp: 30 tablet, Rfl: 0   sertraline (ZOLOFT) 100 MG tablet, Take 100 mg by mouth daily. (Patient not taking: Reported on 01/23/2023), Disp: , Rfl:  Allergies  Allergen Reactions   Tape Other (See Comments)    SKIN  TEARS VERY, VERY EASILY!!!! Use Paper Tape Only, PLEASE!!!   Amiodarone Other (See Comments)    Caused hyperthyroidism    Statins Other (See Comments)    Myalgias    Wound Dressing Adhesive Other (See Comments)    Skin Tears Use Paper Tape Only     Social History   Socioeconomic History   Marital status: Divorced    Spouse name: Not on file   Number of children: 2   Years of education: Not on file   Highest education level: Not on file  Occupational History   Occupation: retired/disabled. prev worked in Teacher, English as a foreign language.    Employer: DISABLED  Tobacco Use   Smoking status: Former    Packs/day: 2.00    Years: 30.00    Additional pack years: 0.00    Total pack years: 60.00    Types: Cigarettes    Quit date: 10/16/2007    Years since quitting: 15.3   Smokeless tobacco: Never  Vaping Use   Vaping Use: Never used  Substance and Sexual Activity   Alcohol use: No   Drug use: No   Sexual activity: Not Currently  Other Topics Concern   Not on file  Social History Narrative   Lives alone.   Social Determinants of Health   Financial Resource Strain: Low Risk  (06/29/2022)   Overall Financial Resource Strain (CARDIA)    Difficulty of Paying Living Expenses: Not hard at all  Food Insecurity: No Food Insecurity (11/26/2022)   Hunger Vital Sign    Worried About Running Out of Food in the Last Year: Never true    Ran Out of Food in the Last Year: Never true  Transportation Needs: No Transportation Needs (11/26/2022)   PRAPARE - Administrator, Civil Service (Medical): No    Lack of Transportation (  Non-Medical): No  Physical Activity: Inactive (06/29/2022)   Exercise Vital Sign    Days of Exercise per Week: 0 days    Minutes of Exercise per Session: 0 min  Stress: No Stress Concern Present (06/29/2022)   [employer] of Occupational Health - Occupational Stress Questionnaire    Feeling of Stress : Not at all  Social Connections: Moderately Integrated  (06/29/2022)   Social Connection and Isolation Panel [NHANES]    Frequency of Communication with Friends and Family: More than three times a week    Frequency of Social Gatherings with Friends and Family: More than three times a week    Attends Religious Services: More than 4 times per year    Active Member of Clubs or Organizations: Yes    Attends Banker Meetings: More than 4 times per year    Marital Status: Divorced  Intimate Partner Violence: Not At Risk (11/22/2022)   Humiliation, Afraid, Rape, and Kick questionnaire    Fear of Current or Ex-Partner: No    Emotionally Abused: No    Physically Abused: No    Sexually Abused: No    Physical Exam      Future Appointments  Date Time Provider Department Center  02/01/2023  3:00 PM Lenna Gilford, RPH [facility] None  02/04/2023  7:05 AM [facility] DEVICE REMOTES [facility]  02/13/2023  1:45 PM Luciano Cutter, MD [facility]  02/18/2023  1:30 PM [facility] PA/NP [facility] None  03/05/2023  1:45 PM Edwin Cap, DPM [facility]  03/12/2023  1:40 PM Laurey Morale, MD [facility] None  03/29/2023  1:00 PM Judi Saa, DO [facility] None  04/24/2023  9:05 AM [facility] DEVICE REMOTES [facility]  07/01/2023  1:20 PM [name], MD [facility] None  07/24/2023  9:05 AM [facility] DEVICE REMOTES [facility]  10/23/2023  9:05 AM [facility] DEVICE REMOTES [facility]  01/22/2024  9:05 AM [facility] DEVICE REMOTES [facility]  04/22/2024  9:05 AM [facility] DEVICE REMOTES [facility]

## 2023-02-01 ENCOUNTER — Encounter: Payer: Self-pay | Admitting: Cardiology

## 2023-02-01 ENCOUNTER — Other Ambulatory Visit: Payer: Medicare HMO

## 2023-02-04 ENCOUNTER — Ambulatory Visit (INDEPENDENT_AMBULATORY_CARE_PROVIDER_SITE_OTHER): Payer: Medicare HMO

## 2023-02-04 DIAGNOSIS — I5020 Unspecified systolic (congestive) heart failure: Secondary | ICD-10-CM

## 2023-02-04 DIAGNOSIS — Z95810 Presence of automatic (implantable) cardiac defibrillator: Secondary | ICD-10-CM

## 2023-02-04 NOTE — Progress Notes (Signed)
02/04/2023 Name: [name] MRN: [id number] DOB: [birth year]  Chief Complaint  Patient presents with   Medication Management   [name] is a 67 y.o. year old male who presented for a telephone visit.   They were referred to the pharmacist by their Case Management Team  for assistance in managing complex medication management.   Patient is participating in a Managed Medicaid Plan:  No  Subjective: Telephone visit for medication management after referral received from social work in regard to polypharmacy.  Care Team: Primary Care Provider: [name], MD ; Next Scheduled Visit: 06/21/23  Medication Access/Adherence  Current Pharmacy:  [id number] [place], [place] - [address] AT [facility] [address] Sheila Oats [place] [address] Phone: [phone] Fax: [phone]  Patient reports affordability concerns with their medications: Yes  Patient reports access/transportation concerns to their pharmacy: No  Patient reports adherence concerns with their medications:  No     Medication Management: Current adherence strategy: [facility] visits regularly and provided patient with weekly pill organizer, but patient prefers previous method of taking prescribed medications from bottles -Patient reports Good adherence to medications -Complete review of medication list, specifically addressing any barriers to adherence as well as toleration of current regimen -Patient endorses no daytime sedation, grogginess, or challenges with memory in regard to current regimen -States he has history of significant insomnia with previous sleep study, and current regimen is working; he does not wish to make any changes -Patient reports the following barriers to adherence: inability to afford Dupixent and Repatha -Previously applied for Medicare Extra Help and was denied, but he does have PAP for Eliquis and Trelegy  Objective: Lab Results  Component Value Date   HGBA1C  5.8 12/31/2022   Lab Results  Component Value Date   CREATININE 1.22 01/25/2023   BUN 37 (H) 01/25/2023   NA 138 01/25/2023   K 3.6 01/25/2023   CL 98 01/25/2023   CO2 26 01/25/2023   Lab Results  Component Value Date   CHOL 164 12/31/2022   HDL 65.80 12/31/2022   LDLCALC 79 12/31/2022   LDLDIRECT 72.0 02/28/2021   TRIG 96.0 12/31/2022   CHOLHDL 2 12/31/2022   Medications Reviewed Today     Reviewed by Lenna Gilford, RPH (Pharmacist) on 02/01/23 at 1526  Med List Status: <None>   Medication Order Taking? Sig Documenting Provider Last Dose Status Informant  acetaminophen (TYLENOL) 500 MG tablet [id number] Yes Take 500-1,000 mg by mouth every 6 (six) hours as needed for mild pain or headache. [provider] Taking Active Self  albuterol (VENTOLIN HFA) 108 (90 Base) MCG/ACT inhaler [id number] Yes INHALE 2 PUFFS BY MOUTH 4 TIMES A DAY AS NEEDED [name], MD Taking Active Self           Med Note Katrinka Blazing, DEDE   Wed Jan 30, 2023  3:45 PM) Taking when needed  ALPRAZolam Prudy Feeler) 0.5 MG tablet [id number] Yes TAKE 2 TABLETS (1 MG TOTAL) BY MOUTH EVERY DAY AT BEDTIME [name], MD Taking Active   ammonium lactate (AMLACTIN) 12 % cream [id number] Yes Apply 1 Application topically as needed for dry skin. Edwin Cap, DPM Taking Active   apixaban (ELIQUIS) 5 MG TABS tablet [id number] Yes Take 1 tablet (5 mg total) by mouth 2 (two) times daily. Laurey Morale, MD Taking Active   augmented betamethasone dipropionate (DIPROLENE-AF) 0.05 % cream [id number] Yes Apply 1 application  topically daily as  needed (to affected areas- for itching). [provider] Taking Active Self  carisoprodol (SOMA) 350 MG tablet [id number] Yes TAKE 1 TABLET BY MOUTH THREE TIMES A DAY AS NEEDED FOR MUSCLE SPASM [name], MD Taking Active   carvedilol (COREG) 6.25 MG tablet [id number] Yes TAKE 1 TABLET BY MOUTH TWICE A DAY Laurey Morale, MD Taking Active Self  Cholecalciferol (VITAMIN  D3) 1000 units CAPS [id number] Yes Take 1,000 Units by mouth daily. [provider] Taking Active Self  digoxin (LANOXIN) 0.125 MG tablet [id number] Yes Take 1 tablet (0.125 mg total) by mouth daily. Laurey Morale, MD Taking Active            Med Note Littie Deeds, Gitty Osterlund A   Fri Feb 01, 2023  3:08 PM) Just restarted  doxepin (SINEQUAN) 10 MG capsule [id number] Yes TAKE 2 CAPSULES (20 MG TOTAL) BY MOUTH AT BEDTIME AS NEEDED. [name], MD Taking Active Self  DULoxetine (CYMBALTA) 60 MG capsule [id number] Yes Take 60 mg by mouth daily. [provider] Taking Active Self  Dupilumab (DUPIXENT) 300 MG/2ML SOPN [id number] No Inject 300 mg into the skin every 14 (fourteen) days.  Patient not taking: Reported on 01/23/2023   [provider] Not Taking Active Self           Med Note Katrinka Blazing, DEDE   Wed Jan 23, 2023  4:24 PM) Needs refill/cost prohibitive  eplerenone (INSPRA) 25 MG tablet [id number] Yes Take 1 tablet (25 mg total) by mouth daily. [place], Anderson Malta, FNP Taking Active   Evolocumab [facility] SURECLICK) 140 MG/ML Ivory Broad [id number] No Inject 140 mg into the skin every 14 (fourteen) days.  Patient not taking: Reported on 02/01/2023   Laurey Morale, MD Not Taking Active   fluticasone [facility]) 50 MCG/ACT nasal spray [id number] Yes PLACE 2 SPRAYS INTO BOTH NOSTRILS DAILY AS NEEDED FOR ALLERGIES OR RHINITIS. [name], MD Taking Active Self           Med Note Katrinka Blazing, DEDE   Wed Jan 23, 2023  4:25 PM) prn  Fluticasone-Umeclidin-Vilant (TRELEGY ELLIPTA) 100-62.5-25 MCG/ACT AEPB [id number] No Inhale 1 puff into the lungs daily.  Patient not taking: Reported on 01/23/2023   Luciano Cutter, MD Not Taking Active Self           Med Note Katrinka Blazing, DEDE   Wed Jan 30, 2023  7:11 PM) Needs refill and financial assistance  hydrOXYzine (ATARAX) 10 MG tablet [id number] Yes TAKE 1 TABLET BY MOUTH 3 TIMES DAILY AS NEEDED FOR ITCHING. Strength: 10 mg [name], MD Taking Active             Med Note Katrinka Blazing, DEDE   Wed Jan 23, 2023  4:28 PM) Takes 1 at bedtime only  ipratropium-albuterol (DUONEB) 0.5-2.5 (3) MG/3ML SOLN [id number] Yes TAKE 3 ML BY NEBULIZATION EVERY 4 HOURS AS NEEDED (WHEEZING). [name], MD Taking Active   ketoconazole (NIZORAL) 2 % cream [id number] No Apply 1 Application topically daily.  Patient not taking: Reported on 02/01/2023   Plotnikov, Georgina Quint, MD Not Taking Active Self  KLOR-CON M20 20 MEQ tablet [id number] Yes TAKE 1 TABLET BY MOUTH EVERY DAY  Patient taking differently: Take 40 mEq by mouth daily.   Laurey Morale, MD Taking Active Self  losartan (COZAAR) 25 MG tablet [id number] Yes Take 1 tablet (25 mg total) by mouth daily. Robbie Lis M, PA-C Taking Active   omeprazole (PRILOSEC) 20 MG capsule [id number] Yes TAKE  1 CAPSULE BY MOUTH TWICE A DAY [name], MD Taking Active Self           Med Note Katrinka Blazing, DEDE   Thu Jan 03, 2023  3:01 PM) Only takes 1 daily.  ondansetron (ZOFRAN) 4 MG tablet [id number] Yes Take 1 tablet (4 mg total) by mouth every 6 (six) hours as needed for nausea. [name], MD Taking Active            Med Note Katrinka Blazing, DEDE   Wed Jan 23, 2023  4:32 PM) PRN  predniSONE (DELTASONE) 10 MG tablet [id number] Yes Take 1 tablet (10 mg total) by mouth daily with breakfast. Judi Saa, DO Taking Active   promethazine (PHENERGAN) 25 MG tablet [id number] Yes Take 1 tablet (25 mg total) by mouth every 8 (eight) hours as needed for nausea or vomiting. [name], MD Taking Active            Med Note Katrinka Blazing, DEDE   Wed Jan 23, 2023  4:37 PM) PRN Nausea  Respiratory Therapy Supplies (FULL KIT NEBULIZER SET) MISC [id number] Yes 1 each by Does not apply route as needed. Garnette Gunner, MD Taking Active Self  silver sulfADIAZINE (SILVADENE) 1 % cream [id number] Yes APPLY TOPICALLY TO AFFECTED AREA EVERY DAY  Patient taking differently: Apply 1 Application topically daily as needed (to wound sites that won't heal).   [name], MD Taking Active Self  tamsulosin [facility]) 0.4 MG CAPS capsule [id number] Yes TAKE 1 CAPSULE BY MOUTH EVERY DAY  Patient taking differently: Take 0.4 mg by mouth every evening.   [name], MD Taking Active Self  torsemide (DEMADEX) 20 MG tablet [id number] Yes Take 5 tablets (100 mg total) by mouth every morning AND 4 tablets (80 mg total) every evening. Allayne Butcher, PA-C Taking Active            Med Note Katrinka Blazing, DEDE   Wed Jan 30, 2023  7:11 PM)    traMADol Janean Sark) 50 MG tablet [id number] Yes TAKE 1 TABLET BY MOUTH EVERY DAY AS NEEDED [name], MD Taking Active Self           Med Note Littie Deeds, [place] A   Fri Feb 01, 2023  3:23 PM) Rarely-maybe once a month  traZODone (DESYREL) 100 MG tablet [id number] Yes TAKE 2 TABLETS BY MOUTH AT BEDTIME AS NEEDED [name], MD Taking Active Self           Med Note Katrinka Blazing, DEDE   Wed Jan 23, 2023  4:45 PM) Leanora Ivanoff nightly  TUMS 500 MG chewable tablet [id number] Yes Chew 500-2,000 mg by mouth every 4 (four) hours as needed for indigestion or heartburn. [provider] Taking Active Self           Med Note Katrinka Blazing, DEDE   Wed Jan 23, 2023  4:45 PM) PRN  venlafaxine XR (EFFEXOR XR) 150 MG 24 hr capsule [id number] Yes Take 1 capsule (150 mg total) by mouth daily with breakfast. [name], MD Taking Active Self           Med Note Katrinka Blazing, DEDE   Wed Jan 23, 2023  4:49 PM) Taking at bedtime  zolpidem (AMBIEN) 10 MG tablet [id number] Yes TAKE 1 TABLET BY MOUTH EVERY DAY AT BEDTIME AS NEEDED [name], MD Taking Active Self           Med Note Katrinka Blazing, DEDE   Wed Jan 30, 2023  3:57 PM) Says he takes every night           Assessment/Plan:   Medication Management: - Currently strategy sufficient to maintain appropriate adherence to prescribed medication regimen - Hypercholesterolemia fund is currently open through [employer], and Repatha is a covered treatment.  Messaging patient information on applying for  grant. - Dupixent My Way program has support staff to help patients navigate coverage and financial assistance for Dupixent; providing program contact number to patient - Advised patient to continue using as needed medications only if symptomatic and to contact Dr. Jonny Ruiz if somnolence, memory loss, sedation begin to present  Follow Up Plan: Providing my direct number for patient to contact if there are questions/needs in regard to applying for Repatha grant  Lenna Gilford, PharmD, DPLA

## 2023-02-05 ENCOUNTER — Telehealth (HOSPITAL_COMMUNITY): Payer: Self-pay | Admitting: Emergency Medicine

## 2023-02-05 NOTE — Telephone Encounter (Signed)
[name] text me to advise that he was going out of town again on [employer] of this week to visit his brother again.  He advised he was feeling well and we could reschedule when he gets back into town.    Beatrix Shipper, EMT-Paramedic [phone] 02/05/2023

## 2023-02-07 ENCOUNTER — Telehealth: Payer: Self-pay

## 2023-02-07 NOTE — Progress Notes (Signed)
   02/07/2023  Patient ID: [name], male   DOB: 1956-09-25, 67 y.o.   MRN: [id number]  Mr. Shaffer called to verify he had the correct information for contacting Repatha grant program and Dupixent representative.  Verified the website for [facility] and the phone number for Dupixent.  Patient plans to look into both of these today with internet assistance from his pastor.  He will contact me with any additional questions or needs.  Lenna Gilford, PharmD, DPLA

## 2023-02-08 ENCOUNTER — Telehealth: Payer: Self-pay

## 2023-02-08 NOTE — Telephone Encounter (Signed)
Remote ICM transmission received.  Attempted call to patient regarding ICM remote transmission and  recording stated number is not in service.

## 2023-02-08 NOTE — Progress Notes (Signed)
EPIC Encounter for ICM Monitoring  Patient Name: [name] is a 67 y.o. male Date: 02/08/2023 Primary Care Physican: [name], MD Primary Cardiologist: Shirlee Latch Electrophysiologist: Joycelyn Schmid Pacing: 99.8%    10/02/2022 Weight: 265 lbs 01/24/2023 Weight: 260 lbs     Attempted call to patient and unable to reach.   Transmission reviewed.    Optivol thoracic impedance suggesting possible dryness starting 3/20 but trending back toward baseline normal.   Prescribed:  Torsemide 20 mg 5 tablet(s) (100 mg total) by mouth and 4 tablets (80 mg) every evening (increased 4/4).   Potassium 20 mEq take 1 tablet (20 mEq total) daily   Labs: 01/25/2023 Creatinine 1.22, BUN 37, Potassium 3.6, Sodium 138, GFR >60 01/15/2023 Creatinine 1.38, BUN 22, Potassium 4.6, Sodium 134, GFR 52.70  12/31/2022 Creatinine 1.27, BUN 36, Potassium 5.2, Sodium 136, GFR 58.24  12/18/2022 Creatinine 1.13, BUN 36, Potassium 4.5, Sodium 135  11/24/2022 Creatinine 1.45, BUN 45, Potassium 3.2, Sodium 135, GFR 53 (12:09 PM) 11/24/2022 Creatinine 1.59, BUN 51, Potassium 4.0, Sodium 138, GFR 47 (6:01 AM) A complete set of results can be found in Results Review.   Recommendations:   Unable to reach.     Follow-up plan: ICM clinic phone appointment on 03/04/2023.   91 day device clinic remote transmission 04/24/2023.   EP/Cardiology Office Visits:    Recall 11/28/2023 with Dr Graciela Husbands.  02/18/2023 with HF clinic.  03/12/2023 with Dr Shirlee Latch.   Copy of ICM check sent to Dr. Graciela Husbands.    3 month ICM trend: 02/05/2023.    12-14 Month ICM trend:     Karie Soda, RN 02/08/2023 4:26 PM

## 2023-02-10 ENCOUNTER — Other Ambulatory Visit: Payer: Self-pay | Admitting: Internal Medicine

## 2023-02-13 ENCOUNTER — Other Ambulatory Visit (HOSPITAL_COMMUNITY): Payer: Self-pay

## 2023-02-13 ENCOUNTER — Ambulatory Visit (HOSPITAL_BASED_OUTPATIENT_CLINIC_OR_DEPARTMENT_OTHER): Payer: Medicare HMO | Admitting: Pulmonary Disease

## 2023-02-13 ENCOUNTER — Encounter (HOSPITAL_BASED_OUTPATIENT_CLINIC_OR_DEPARTMENT_OTHER): Payer: Self-pay | Admitting: Pulmonary Disease

## 2023-02-13 VITALS — BP 108/72 | HR 75 | Temp 98.3°F | Ht 70.0 in | Wt 276.0 lb

## 2023-02-13 DIAGNOSIS — G4733 Obstructive sleep apnea (adult) (pediatric): Secondary | ICD-10-CM | POA: Diagnosis not present

## 2023-02-13 DIAGNOSIS — J4489 Other specified chronic obstructive pulmonary disease: Secondary | ICD-10-CM | POA: Diagnosis not present

## 2023-02-13 MED ORDER — ALBUTEROL SULFATE HFA 108 (90 BASE) MCG/ACT IN AERS: 1.0000 | INHALATION_SPRAY | Freq: Four times a day (QID) | RESPIRATORY_TRACT | 5 refills | Status: DC | PRN

## 2023-02-13 MED ORDER — FLUTICASONE-SALMETEROL 250-50 MCG/ACT IN AEPB: 1.0000 | INHALATION_SPRAY | Freq: Two times a day (BID) | RESPIRATORY_TRACT | 11 refills | Status: DC

## 2023-02-13 NOTE — Patient Instructions (Signed)
COPD-asthma overlap - not in exacerbation. Uncontrolled symptoms --CONTINUE Dupixent as scheduled. Managed by Dermatology for eczema --START Wixela 250-50 mcg ONE puff in the morning and evening --CONTINUE Albuterol AS NEEDED for shortness of breath or wheezing --START aerobic exercise 30 minutes five days a week  History of OSA Hypoxemia Last sleep test before 2013 Ambulatory O2 on last visit with no desaturations. Patient does not wish to be evaluated --CANCEL home sleep study --CANCEL overnight oximetry

## 2023-02-13 NOTE — Progress Notes (Signed)
Subjective:   PATIENT ID: [name] GENDER: male DOB: 09-04-1956, MRN: [id number]  Chief Complaint  Patient presents with   Follow-up    Follow up. Patient wants to talk about not doing cpap and oxygen.     Reason for Visit: Follow-up  Mr. Sisto Granillo is a 67 year old male former smoker with COPD, childhood asthma, OSA not on CPAP, DM2, HTN, atrial fib on eliquis, NICM s/p AICD, chronic diastolic heart failure who presents for follow-up.  He was recently treated for COPD exacerbation on 09/24/22. Treated with augmentin and doxycycline. On duonebs and albuterol. Referred to Pulmonary. Cough and wheezing has improved. He reports shortness of breath with walking a few steps. Denies chronic cough. Has baseline wheezing. Albuterol usually controls his symptoms. Currently on Dupixent which is managed by his dermatologist. Has been on it for two years and feels this helps his breathing. Before the pandemic [facility] [place] [facility] [place] [facility].   12/31/22 Since our last visit he has had worsening shortness of breath associated with exertional wheezing. Uses nebulizer twice a day. Compliant with Trelegy daily. Congestion has worsened with the pollen. Reports improved cough and wheezing overall but still persistent. Denies recent fevers, chills. He was treated for RLE cellulitis last month but also treated for heart failure while inpatient.   02/13/23 Since our last visit he was treated for exacerbation and feeling better after steroids. Continues to have shortness of breath, cough and occasional wheezing. He is not on Trelegy and using Duonebs twice a day. He uses albuterol twice a day on average. He initially did not want to walk or be evaluated for oxygen needs due to concern for price and not wanting to wear oxygen. Unable to afford Dupixent and does not qualify for assistance per patient with Allergy.  Social History: Quit in 2009. Former smoker 60 pack -years   Past  Medical History:  Diagnosis Date   AICD (automatic cardioverter/defibrillator) present    Anemia    supposed to be taking Vit B but doesn't   ANXIETY    takes Xanax nightly   Arthritis    Asthma    Albuterol prn and Advair daily;also takes Prednisone daily   Cardiomyopathy (HCC)    a. EF 25% TEE July 2013; b. EF normalized 2015;  c. 03/2015 Echo: EF 40-45%, difrf HK, PASP 38 mmHg, Mild MR, sev LAE/RAE.   Chronic constipation    takes OTC stool softener   COPD (chronic obstructive pulmonary disease) (HCC)    "one dr says COPD; one dr says emphysema" (09/18/2017)   COVID-19 12/09/2019   DEPRESSION    takes Zoloft and Doxepin daily   Diverticulitis    DYSKINESIA, ESOPHAGUS    Essential hypertension        FIBROMYALGIA    GERD (gastroesophageal reflux disease)        Glaucoma    HYPERLIPIDEMIA    a. Intolerant to statins.   INSOMNIA    takes Ambien nightly   Myocardial infarction [facility])    a. 2012 Myoview notable for prior infarct;  b. 03/2015 Lexiscan CL: EF 37%, diff HK, small area of inferior infarct from apex to base-->Med Rx.   O2 dependent    "2.5L q hs & prn" (09/18/2017)   Paroxysmal atrial fibrillation (HCC)    a. CHA2DS2VASc = 3--> takes Coumadin;  b. 03/15/2015 Successful TEE/DCCV;  c. 03/2015 recurrent afib, Amio d/c'd in setting of hyperthyroidism.   Peripheral neuropathy  Pneumonia 12/2016   Rash and other nonspecific skin eruption 04/12/2009   no cause found saw dermatologists x 2 and allergist   SLEEP APNEA, OBSTRUCTIVE    a. doesn't use CPAP   Syncope    a. 03/2015 s/p MDT LINQ.   Type II diabetes mellitus (HCC)          Family History  Problem Relation Age of Onset   COPD Mother    Asthma Mother    Colon polyps Mother    Allergies Mother    Hypothyroidism Mother    Asthma Maternal Grandmother    Colon cancer Neg Hx      Social History   Occupational History   Occupation: retired/disabled. prev worked in Teacher, English as a foreign language.    Employer: DISABLED   Tobacco Use   Smoking status: Former    Packs/day: 2.00    Years: 30.00    Additional pack years: 0.00    Total pack years: 60.00    Types: Cigarettes    Quit date: 10/16/2007    Years since quitting: 15.3   Smokeless tobacco: Never  Vaping Use   Vaping Use: Never used  Substance and Sexual Activity   Alcohol use: No   Drug use: No   Sexual activity: Not Currently    Allergies  Allergen Reactions   Tape Other (See Comments)    SKIN TEARS VERY, VERY EASILY!!!! Use Paper Tape Only, PLEASE!!!   Amiodarone Other (See Comments)    Caused hyperthyroidism    Statins Other (See Comments)    Myalgias    Wound Dressing Adhesive Other (See Comments)    Skin Tears Use Paper Tape Only     Outpatient Medications Prior to Visit  Medication Sig Dispense Refill   acetaminophen (TYLENOL) 500 MG tablet Take 500-1,000 mg by mouth every 6 (six) hours as needed for mild pain or headache.     albuterol (VENTOLIN HFA) 108 (90 Base) MCG/ACT inhaler INHALE 2 PUFFS BY MOUTH 4 TIMES A DAY AS NEEDED 54 each 3   ALPRAZolam (XANAX) 0.5 MG tablet TAKE 2 TABLETS (1 MG TOTAL) BY MOUTH EVERY DAY AT BEDTIME 60 tablet 5   ammonium lactate (AMLACTIN) 12 % cream Apply 1 Application topically as needed for dry skin. 385 g 3   apixaban (ELIQUIS) 5 MG TABS tablet Take 1 tablet (5 mg total) by mouth 2 (two) times daily. 60 tablet 11   augmented betamethasone dipropionate (DIPROLENE-AF) 0.05 % cream Apply 1 application  topically daily as needed (to affected areas- for itching).     carisoprodol (SOMA) 350 MG tablet TAKE 1 TABLET BY MOUTH THREE TIMES A DAY AS NEEDED FOR MUSCLE SPASM 90 tablet 2   carvedilol (COREG) 6.25 MG tablet TAKE 1 TABLET BY MOUTH TWICE A DAY 180 tablet 3   Cholecalciferol (VITAMIN D3) 1000 units CAPS Take 1,000 Units by mouth daily.     digoxin (LANOXIN) 0.125 MG tablet Take 1 tablet (0.125 mg total) by mouth daily. 30 tablet 11   doxepin (SINEQUAN) 10 MG capsule TAKE 2 CAPSULES (20 MG TOTAL)  BY MOUTH AT BEDTIME AS NEEDED. 180 capsule 1   DULoxetine (CYMBALTA) 60 MG capsule Take 60 mg by mouth daily.     eplerenone (INSPRA) 25 MG tablet Take 1 tablet (25 mg total) by mouth daily. 30 tablet 8   fluticasone (FLONASE) 50 MCG/ACT nasal spray PLACE 2 SPRAYS INTO BOTH NOSTRILS DAILY AS NEEDED FOR ALLERGIES OR RHINITIS. 48 mL 1   hydrOXYzine (ATARAX) 10 MG  tablet TAKE 1 TABLET BY MOUTH 3 TIMES DAILY AS NEEDED FOR ITCHING. Strength: 10 mg 60 tablet 2   ipratropium-albuterol (DUONEB) 0.5-2.5 (3) MG/3ML SOLN TAKE 3 ML BY NEBULIZATION EVERY 4 HOURS AS NEEDED (WHEEZING). 360 mL 0   KLOR-CON M20 20 MEQ tablet TAKE 1 TABLET BY MOUTH EVERY DAY (Patient taking differently: Take 40 mEq by mouth daily.) 90 tablet 3   losartan (COZAAR) 25 MG tablet Take 1 tablet (25 mg total) by mouth daily. 90 tablet 3   omeprazole (PRILOSEC) 20 MG capsule TAKE 1 CAPSULE BY MOUTH TWICE A DAY 180 capsule 1   ondansetron (ZOFRAN) 4 MG tablet Take 1 tablet (4 mg total) by mouth every 6 (six) hours as needed for nausea. 40 tablet 0   predniSONE (DELTASONE) 10 MG tablet Take 1 tablet (10 mg total) by mouth daily with breakfast. 30 tablet 0   promethazine (PHENERGAN) 25 MG tablet Take 1 tablet (25 mg total) by mouth every 8 (eight) hours as needed for nausea or vomiting. 30 tablet 0   Respiratory Therapy Supplies (FULL KIT NEBULIZER SET) MISC 1 each by Does not apply route as needed. 1 each 0   silver sulfADIAZINE (SILVADENE) 1 % cream APPLY TOPICALLY TO AFFECTED AREA EVERY DAY (Patient taking differently: Apply 1 Application topically daily as needed (to wound sites that won't heal).) 50 g 3   tamsulosin (FLOMAX) 0.4 MG CAPS capsule TAKE 1 CAPSULE BY MOUTH EVERY DAY (Patient taking differently: Take 0.4 mg by mouth every evening.) 90 capsule 3   torsemide (DEMADEX) 20 MG tablet Take 5 tablets (100 mg total) by mouth every morning AND 4 tablets (80 mg total) every evening. 810 tablet 3   traMADol (ULTRAM) 50 MG tablet TAKE 1  TABLET BY MOUTH EVERY DAY AS NEEDED 30 tablet 2   traZODone (DESYREL) 100 MG tablet TAKE 2 TABLETS BY MOUTH AT BEDTIME AS NEEDED 180 tablet 1   TUMS 500 MG chewable tablet Chew 500-2,000 mg by mouth every 4 (four) hours as needed for indigestion or heartburn.     venlafaxine XR (EFFEXOR XR) 150 MG 24 hr capsule Take 1 capsule (150 mg total) by mouth daily with breakfast. 90 capsule 3   zolpidem (AMBIEN) 10 MG tablet TAKE 1 TABLET BY MOUTH EVERY DAY AT BEDTIME AS NEEDED 90 tablet 1   Dupilumab (DUPIXENT) 300 MG/2ML SOPN Inject 300 mg into the skin every 14 (fourteen) days. (Patient not taking: Reported on 01/23/2023)     Evolocumab (REPATHA SURECLICK) 140 MG/ML SOAJ Inject 140 mg into the skin every 14 (fourteen) days. (Patient not taking: Reported on 02/01/2023) 6 mL 3   Fluticasone-Umeclidin-Vilant (TRELEGY ELLIPTA) 100-62.5-25 MCG/ACT AEPB Inhale 1 puff into the lungs daily. (Patient not taking: Reported on 01/23/2023) 180 each 2   ketoconazole (NIZORAL) 2 % cream Apply 1 Application topically daily. (Patient not taking: Reported on 02/01/2023) 120 g 1   No facility-administered medications prior to visit.    Review of Systems  Constitutional:  Negative for chills, diaphoresis, fever, malaise/fatigue and weight loss.  HENT:  Negative for congestion.   Respiratory:  Positive for cough, shortness of breath and wheezing. Negative for hemoptysis and sputum production.   Cardiovascular:  Negative for chest pain, palpitations and leg swelling.     Objective:   Vitals:   02/13/23 1351  BP: 108/72  Pulse: 75  Temp: 98.3 F (36.8 C)  TempSrc: Oral  SpO2: 93%  Weight: 276 lb (125.2 kg)  Height: 5\' 10"  (1.778  m)   SpO2: 93 % O2 Device: None (Room air)  Body mass index is 39.6 kg/m.  Physical Exam: General: Well-appearing, no acute distress HENT: [place], AT Eyes: EOMI, no scleral icterus Respiratory: Clear to auscultation bilaterally.  No crackles, wheezing or rales Cardiovascular: RRR,  -M/R/G, no JVD Extremities:-Edema,-tenderness Neuro: AAO x4, CNII-XII grossly intact Psych: Normal mood, normal affect  Data Reviewed:  Imaging: CXR 05/05/21 - Cardiomegaly. S/p biventricular ICD. Small bilateral pleural effusion CXR 11/29/22 - Cardiomegaly. S/p biventricular ICD. Resolved effusions CXR 01/25/23 - Cardiomegaly. S/p biventricular ICD. Bibasilar atelectasis  PFT: 01/27/14 FVC 3.35 (72%) FEV1 2.41 (68%) Ratio 65  TLC 94% DLCO 77% Interpretation: Moderate obstructive defect with mildly reduced DLCO. Borderline bronchodilator response in FEV1 present.  Labs: CBC    Component Value Date/Time   WBC 9.0 01/25/2023 1727   RBC 4.33 01/25/2023 1727   HGB 13.7 01/25/2023 1727   HGB 12.8 (L) 12/19/2017 1532   HCT 41.7 01/25/2023 1727   HCT 38.0 12/19/2017 1532   PLT 237 01/25/2023 1727   PLT 208 12/19/2017 1532   MCV 96.3 01/25/2023 1727   MCV 99 (H) 12/19/2017 1532   MCH 31.6 01/25/2023 1727   MCHC 32.9 01/25/2023 1727   RDW 13.6 01/25/2023 1727   RDW 14.0 12/19/2017 1532   LYMPHSABS 2.0 01/15/2023 1532   LYMPHSABS 1.9 12/19/2017 1532   MONOABS 1.0 01/15/2023 1532   EOSABS 0.2 01/15/2023 1532   EOSABS 0.3 12/19/2017 1532   BASOSABS 0.1 01/15/2023 1532   BASOSABS 0.0 12/19/2017 1532   Abs eos 12/20/21 -100     Assessment & Plan:   Discussion: 67 year old male former smoker with COPD, childhood asthma, OSA not on CPAP, DM2, HTN, atrial fib on eliquis, NICM s/p AICD, chronic diastolic heart failure who presents for follow-up. DOE is related to deconditioning, uncontrolled OSA/nocturnal hypoxemia and inadequately treated COPD. Discussed clinical course and management of COPD/asthma including optimizing bronchodilator regimen and action plan for exacerbation. Short acting PRN nebulizers will not control his symptoms. Pharmacy team contacted and Wixela priced at $26 which patient agreed was acceptable.   COPD-asthma overlap - not in exacerbation. Uncontrolled  symptoms --START Wixela 250-50 mcg ONE puff in the morning and evening --CONTINUE Albuterol AS NEEDED for shortness of breath or wheezing --START aerobic exercise 30 minutes five days a week  History of OSA Hypoxemia Last sleep test before 2013 Ambulatory O2 on last visit with no desaturations. Patient does not wish to be evaluated --CANCEL home sleep study --CANCEL overnight oximetry  Health Maintenance Immunization History  Administered Date(s) Administered   Fluad Quad(high Dose 65+) 06/29/2022   Influenza Inj Mdck Quad With Preservative 07/22/2019   Influenza Split 07/09/2011, 07/23/2012   Influenza Whole 09/16/2006, 08/26/2007, 07/27/2008, 07/26/2009, 07/07/2010   Influenza, High Dose Seasonal PF 06/22/2020, 07/07/2021   Influenza,inj,Quad PF,6+ Mos 06/30/2013, 08/19/2014, 06/27/2015, 07/13/2016, 07/09/2017, 07/18/2018   PFIZER(Purple Top)SARS-COV-2 Vaccination 03/21/2020, 04/11/2020, 11/03/2020   Pneumococcal Conjugate-13 04/29/2017   Pneumococcal Polysaccharide-23 03/29/2010, 07/07/2010, 02/23/2015, 03/17/2021   Td 10/16/2003   Tdap 02/23/2015   CT Lung Screen - not qualified. Quit > 15 years ago  No orders of the defined types were placed in this encounter.  Meds ordered this encounter  Medications   fluticasone-salmeterol (WIXELA INHUB) 250-50 MCG/ACT AEPB    Sig: Inhale 1 puff into the lungs in the morning and at bedtime.    Dispense:  60 each    Refill:  11   albuterol (VENTOLIN HFA) 108 (90 Base)  MCG/ACT inhaler    Sig: Inhale 1-2 puffs into the lungs every 6 (six) hours as needed for wheezing or shortness of breath.    Dispense:  18 g    Refill:  5    Ok to distribute brand insurance preferred    Return in about 3 months (around 05/16/2023).  I have spent a total time of 36-minutes on the day of the appointment including chart review, data review, collecting history, coordinating care and discussing medical diagnosis and plan with the patient/family. Past  medical history, allergies, medications were reviewed. Pertinent imaging, labs and tests included in this note have been reviewed and interpreted independently by me.   Jiovany Scheffel Mechele Collin, MD [facility] 02/13/2023 2:00 PM  Office Number [phone]

## 2023-02-15 NOTE — Progress Notes (Signed)
ID:  [name], DOB Jun 20, 1956, MRN [id number]  Provider location: [facility] Type of Visit: Established patient  PCP:  [name], MD  Cardiologist:  Dr. [name]   History of Present Illness: [name] is a 67 y.o. male who has history of COPD on home oxygen, permanent atrial fibrillation, and chronic systolic CHF.  Amiodarone and DCCV were tried for atrial fibrillation in the past without success.   Patient has been noted to have a low EF since 2013.  EF improved to normal by 2015 but dropped to 30-35% by 11/16.  Cath in 6/17 showed some CAD but not enough to cause cardiomyopathy.  He was admitted in 3/18 with PNA, atrial fibrillation/RVR, and acute on chronic systolic CHF.  Echo in 3/18 showed fall in EF to 10-15%.   Underwent right and left heart cath in 4/18, which showed nonobstructive CAD and relatively optimized filling pressures.    Syncope in 11/18. Loop recorder showed 6 second pause and periods of complete heart block. He had Medtronic BiV ICD placed in 12/18.  In 1/19, he had AV nodal ablation to promote BiV pacing.   Echo was done 6/19 with EF 25-30% with moderate RV dilation/mildly decreased systolic function. CPX 7/19 was submaximal, probably mild functional impairment.    Echo in 1/21 showed EF 35-40% with diffuse hypokinesis, moderate RV dilation with mildly decreased systolic function.   He was admitted with COVID-19 PNA in 2/19.  He had AKI/hypotension and Entresto was stopped.   Echo in 3/22 showed EF 40% with diffuse hypokinesis, mild RV enlargement, mildly decreased RV systolic function.   Presented to [facility] 7/22 with weakness and dizziness. Found to be in hemorrhagic shock from acute GIB. Hgb 5.0, INR 8.4. He was on Coumadin for atrial fibrillation. He was resuscitated with PRBCs, IVF, and given Vitamin K. His Entresto, beta blocker and torsemide were held due to hypotension. He initially required ICU monitoring due to hypotension  requiring pressors. GI team was consulted and planned to treat with oral antibiotics for suspected diverticular bleed with plans for outpatient colonoscopy.  Coumadin was transitioned to Eliquis.  He was discharged back on beta blocker, Entresto and SGLT2i. Dig, spiro and torsemide were held at discharge. Weight 259 lbs.  S/p TKR 1/23  Follow up 12/23, NYHA II-early III, volume overloaded and torsemide increased to 80 bid.   Echo 1/24 EF 40%, RV normal, no significant change from prior.  Admitted 2/24 w/ RLE cellulitis, failed outpatient abx. Received IV eventually transitioned to oral cefadroxil. Followed by ID, saw Dr. Baxter Flattery 12/18/22 and arranged for CT of RLE to rule out deep tissue abscess. Finished abx course.  Recently seen in clinic on 3/5 and was noted to be volume overloaded by Optivol. Low BP limited GDMT titration. He had been off torsemide, [place] and Inspra for unknown reason. Instructed to hold Coreg. Torsemide, Farxiga and Inspra resumed.   He returns back today for 4 wk f/u. Here w/ paramedicine. Volume improved. Optivol down below threshold. Impedence up. BP 106/76. Denies resting dyspnea. C/w NYHA Class II-early III symptoms (stable). He is not very active a baseline. Pretty sedentary. Main issue currently is UTI. Was seen at PCP office yesterday for dysuria. UA +. WBC 10K. Started on course of abx. Other labs reviewed,  K was 4.6, Scr 1.38.  This is his first UTI   ECG (personally reviewed): Not performed   MDT device interrogation: OptiVol fluid index is down, impedence up. No  VT/ VF   Labs (1/19): K 4.4, creatinine 0.75, pro-BNP 433 Labs (2/19): digoxin 0.4 Labs (3/19): LDL 121, hgb 12.8, K 4.5, creatinine 0.98 Labs (6/19): LDL 48, HDL 45, K 5, creatinine 0.94, digoxin 0.3 Labs (9/19): K 3.7, creatinine 1.09 => 1.32, digoxin 0.2 Labs (11/19): LDL 115 Labs (12/20): K 3.9, creatinine 0.87, LDL 94  Labs (3/21): K 4, creatinine 1.08 Labs (6/21): LDL 109 Labs (7/21): K 3.7,  creatinine 1.15 Labs (12/21): LDL 129, TGs 239, hgb 12.9, K 3.9, creatinine 0.98, digoxin 0.8 Labs (1/22): ALT 76, AST 40, LDL 162, K 3.8 =>4.6, creatinine 1.16 => 1.12, digoxin 0.7, repeat LFTs normal Labs (5/22): LDL 72, TGs 335, K 4.2, creatinine 1.29 Labs (7/22): K 4.0, creatinine 0.77 Labs (8/22): K 4.0, creatinine 1.11 Labs (9/22): K 3.7, creatinine 1.14, hgb 11.7 Labs (3/23): LDL 28, TGs 132, LFTs normal, K 4.3, creatinine 1.0 Labs (6/23): K 4.4, creatinine 1.37 Labs (2/24): K 3.2, creatinine 1.45 Labs (3/24): K 4.4, creatinine 1.13, LDL 79 Labs (4/24): K 4.6, creatinine 1.38   PMH: 1. Asthma 2. COPD: On home oxygen. Prior smoker.  3. Atrial fibrillation: Permanent.  He failed DCCV and amiodarone in the past . 4. HTN 5. GERD 6. Hyperlipidemia: Myalgias with atorvastatin 7. BPH.  8. Chronic systolic CHF: Nonischemic cardiomyopathy.  - EF 25% by TEE in 7/13.  Normalized on 2015 echo. - Echo (11/16): EF 30-35%.  - LHC/RHC (6/17): 80% ostial stenosis small D1, 30-40% pLAD.  Mean RA 4, PA 26/10, mean PCWP 11, CI 2.33.  - Echo (3/18): EF 10-15%, diffuse hypokinesis, severe LV dilation, moderate RV dilation with severely decreased systolic function.  - LHC/RHC (4/18): 40% proximal LAD.  Mean RA 8, PA 37/10 mean 22, mean PCWP 22, LVEDP 9, CI 2.39. - Medtronic BiV ICD placement in 12/18 with AV nodal ablation to promote BiV pacing in 1/19.  - Echo (6/19): EF 25-30% with mild LV dilation, moderately dilated RV with mildly decreased systolic function, IVC not dilated, PASP 23 mmHg.  - CPX (7/19): peak VO2 17.6 (78% predicted), VE/VCO2 slope 31, RER 1.02 => submaximal, probably mild functional impairment.  - Echo (1/21): EF 35-40% with diffuse hypokinesis, moderate RV dilation with mildly decreased systolic function. - Echo (3/22): EF 40% with diffuse hypokinesis, mild RV enlargement, mildly decreased RV systolic function.  - Echo (1/24): EF 40%, RV normal, AAA 40 mm 9. Morbid  obesity 10. ILR in place.  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 Complete heart block: s/p Medtronic CRT-D.  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 OSA: Untreated, unable to tolerate CPAP.  15. COVID-19 PNA 2/21.  16. Hyperlipidemia: Myalgias with multiple statins.  17. PAD: ABIs 10/21 noncompressible on right but TBI normal, normal ABI and TBI on left.  18. GI bleeding: 7/22, diverticulosis.  19. Left TKR (1/23)  Social History   Socioeconomic History   Marital status: Divorced    Spouse name: Not on file   Number of children: 2   Years of education: Not on file   Highest education level: Not on file  Occupational History   Occupation: retired/disabled. prev worked in Teacher, English as a foreign language.    Employer: DISABLED  Tobacco Use   Smoking status: Former    Packs/day: 2.00    Years: 30.00    Additional pack years: 0.00    Total pack years: 60.00    Types: Cigarettes    Quit date: 10/16/2007    Years since quitting: 15.3   Smokeless tobacco: Never  Vaping Use  Vaping Use: Never used  Substance and Sexual Activity   Alcohol use: No   Drug use: No   Sexual activity: Not Currently  Other Topics Concern   Not on file  Social History Narrative   Lives alone.   Social Determinants of Health   Financial Resource Strain: Low Risk  (06/29/2022)   Overall Financial Resource Strain (CARDIA)    Difficulty of Paying Living Expenses: Not hard at all  Food Insecurity: No Food Insecurity (11/26/2022)   Hunger Vital Sign    Worried About Running Out of Food in the Last Year: Never true    Ran Out of Food in the Last Year: Never true  Transportation Needs: No Transportation Needs (11/26/2022)   PRAPARE - Administrator, Civil Service (Medical): No    Lack of Transportation (Non-Medical): No  Physical Activity: Inactive (06/29/2022)   Exercise Vital Sign    Days of Exercise per Week: 0 days    Minutes of Exercise per Session: 0 min  Stress: No Stress Concern Present (06/29/2022)    [employer] of Occupational Health - Occupational Stress Questionnaire    Feeling of Stress : Not at all  Social Connections: Moderately Integrated (06/29/2022)   Social Connection and Isolation Panel [NHANES]    Frequency of Communication with Friends and Family: More than three times a week    Frequency of Social Gatherings with Friends and Family: More than three times a week    Attends Religious Services: More than 4 times per year    Active Member of [employer] or Organizations: Yes    Attends Engineer, structural: More than 4 times per year    Marital Status: Divorced  Intimate Partner Violence: Not At Risk (11/22/2022)   Humiliation, Afraid, Rape, and Kick questionnaire    Fear of Current or Ex-Partner: No    Emotionally Abused: No    Physically Abused: No    Sexually Abused: No   Family History  Problem Relation Age of Onset   COPD Mother    Asthma Mother    Colon polyps Mother    Allergies Mother    Hypothyroidism Mother    Asthma Maternal Grandmother    Colon cancer Neg Hx    ROS: All systems reviewed and negative except as per HPI.   Current Outpatient Medications  Medication Sig Dispense Refill   acetaminophen (TYLENOL) 500 MG tablet Take 500-1,000 mg by mouth every 6 (six) hours as needed for mild pain or headache.     albuterol (VENTOLIN HFA) 108 (90 Base) MCG/ACT inhaler Inhale 1-2 puffs into the lungs every 6 (six) hours as needed for wheezing or shortness of breath. 18 g 5   ALPRAZolam (XANAX) 0.5 MG tablet TAKE 2 TABLETS (1 MG TOTAL) BY MOUTH EVERY DAY AT BEDTIME 60 tablet 5   ammonium lactate (AMLACTIN) 12 % cream Apply 1 Application topically as needed for dry skin. 385 g 3   apixaban (ELIQUIS) 5 MG TABS tablet Take 1 tablet (5 mg total) by mouth 2 (two) times daily. 60 tablet 11   augmented betamethasone dipropionate (DIPROLENE-AF) 0.05 % cream Apply 1 application  topically daily as needed (to affected areas- for itching).     carisoprodol (SOMA) 350  MG tablet TAKE 1 TABLET BY MOUTH THREE TIMES A DAY AS NEEDED FOR MUSCLE SPASM 90 tablet 2   carvedilol (COREG) 6.25 MG tablet TAKE 1 TABLET BY MOUTH TWICE A DAY 180 tablet 3   Cholecalciferol (VITAMIN D3) 1000  units CAPS Take 1,000 Units by mouth daily.     digoxin (LANOXIN) 0.125 MG tablet Take 1 tablet (0.125 mg total) by mouth daily. 30 tablet 11   doxepin (SINEQUAN) 10 MG capsule TAKE 2 CAPSULES (20 MG TOTAL) BY MOUTH AT BEDTIME AS NEEDED. 180 capsule 1   DULoxetine (CYMBALTA) 60 MG capsule Take 60 mg by mouth daily.     Dupilumab (DUPIXENT) 300 MG/2ML SOPN Inject 300 mg into the skin every 14 (fourteen) days. (Patient not taking: Reported on 01/23/2023)     eplerenone (INSPRA) 25 MG tablet Take 1 tablet (25 mg total) by mouth daily. 30 tablet 8   Evolocumab (REPATHA SURECLICK) 140 MG/ML SOAJ Inject 140 mg into the skin every 14 (fourteen) days. (Patient not taking: Reported on 02/01/2023) 6 mL 3   fluticasone (FLONASE) 50 MCG/ACT nasal spray PLACE 2 SPRAYS INTO BOTH NOSTRILS DAILY AS NEEDED FOR ALLERGIES OR RHINITIS. 48 mL 1   fluticasone-salmeterol (WIXELA INHUB) 250-50 MCG/ACT AEPB Inhale 1 puff into the lungs in the morning and at bedtime. 60 each 11   hydrOXYzine (ATARAX) 10 MG tablet TAKE 1 TABLET BY MOUTH 3 TIMES DAILY AS NEEDED FOR ITCHING. Strength: 10 mg 60 tablet 2   ipratropium-albuterol (DUONEB) 0.5-2.5 (3) MG/3ML SOLN TAKE 3 ML BY NEBULIZATION EVERY 4 HOURS AS NEEDED (WHEEZING). 360 mL 0   ketoconazole (NIZORAL) 2 % cream Apply 1 Application topically daily. (Patient not taking: Reported on 02/01/2023) 120 g 1   KLOR-CON M20 20 MEQ tablet TAKE 1 TABLET BY MOUTH EVERY DAY (Patient taking differently: Take 40 mEq by mouth daily.) 90 tablet 3   losartan (COZAAR) 25 MG tablet Take 1 tablet (25 mg total) by mouth daily. 90 tablet 3   omeprazole (PRILOSEC) 20 MG capsule TAKE 1 CAPSULE BY MOUTH TWICE A DAY 180 capsule 1   ondansetron (ZOFRAN) 4 MG tablet Take 1 tablet (4 mg total) by mouth  every 6 (six) hours as needed for nausea. 40 tablet 0   predniSONE (DELTASONE) 10 MG tablet Take 1 tablet (10 mg total) by mouth daily with breakfast. 30 tablet 0   promethazine (PHENERGAN) 25 MG tablet Take 1 tablet (25 mg total) by mouth every 8 (eight) hours as needed for nausea or vomiting. 30 tablet 0   Respiratory Therapy Supplies (FULL KIT NEBULIZER SET) MISC 1 each by Does not apply route as needed. 1 each 0   silver sulfADIAZINE (SILVADENE) 1 % cream APPLY TOPICALLY TO AFFECTED AREA EVERY DAY (Patient taking differently: Apply 1 Application topically daily as needed (to wound sites that won't heal).) 50 g 3   tamsulosin (FLOMAX) 0.4 MG CAPS capsule TAKE 1 CAPSULE BY MOUTH EVERY DAY (Patient taking differently: Take 0.4 mg by mouth every evening.) 90 capsule 3   torsemide (DEMADEX) 20 MG tablet Take 5 tablets (100 mg total) by mouth every morning AND 4 tablets (80 mg total) every evening. 810 tablet 3   traMADol (ULTRAM) 50 MG tablet TAKE 1 TABLET BY MOUTH EVERY DAY AS NEEDED 30 tablet 2   traZODone (DESYREL) 100 MG tablet TAKE 2 TABLETS BY MOUTH AT BEDTIME AS NEEDED 180 tablet 1   TUMS 500 MG chewable tablet Chew 500-2,000 mg by mouth every 4 (four) hours as needed for indigestion or heartburn.     venlafaxine XR (EFFEXOR XR) 150 MG 24 hr capsule Take 1 capsule (150 mg total) by mouth daily with breakfast. 90 capsule 3   zolpidem (AMBIEN) 10 MG tablet TAKE 1 TABLET  BY MOUTH EVERY DAY AT BEDTIME AS NEEDED 90 tablet 1   No current facility-administered medications for this visit.   Wt Readings from Last 3 Encounters:  02/13/23 125.2 kg (276 lb)  01/30/23 123.8 kg (273 lb)  01/25/23 122 kg (269 lb)   There were no vitals taken for this visit.  PHYSICAL EXAM: General:  chronically ill appearing, obese in WC. No respiratory difficulty HEENT: normal Neck: supple. no JVD. Carotids 2+ bilat; no bruits. No lymphadenopathy or thyromegaly appreciated. Cor: PMI nondisplaced. Regular rate &  rhythm. No rubs, gallops or murmurs. Lungs: clear Abdomen: obese, soft, nontender, nondistended. No hepatosplenomegaly. No bruits or masses. Good bowel sounds. Extremities: no cyanosis, clubbing, rash, trace b/l LEE R>L chronic venous stasis dermatitis  Neuro: alert & oriented x 3, cranial nerves grossly intact. moves all 4 extremities w/o difficulty. Affect pleasant.   Assessment/Plan:  1. Atrial fibrillation: Permanent.  Now s/p AV nodal ablation to allow effective BiV pacing.  - Continue Eliquis 5 mg bid. Denies abnormal bleeding. Check CBC today  2. COPD: No longer smoking.  Not using oxygen.  - followed by Dr. Lanna Poche  3. Chronic systolic CHF: Nonischemic cardiomyopathy based on 6/17 cath. However, he had a recent significant fall in EF from 30-35% to 10-15% on echo from 3/18.  LHC/RHC in 4/18 showed nonobstructive CAD and relatively optimized filling pressures.  He now has Medtronic CRT-D device with AV nodal ablation to allow BiV pacing.  Echo 6/19 with EF 25-30% with mildly decreased RV systolic function. CPX 7/19 was submaximal but likely only mild functional limitation from CHF.  Echo in 1/21 showed EF 35-40%, echo in 3/22 showed EF up to 40% with mildly decreased RV systolic function.  NYHA class II-early III (chronic)  confounded by body habitus and physical deconditioning. Volume much improved. Optivol fluid index/impedance back to normal.  - Unfortunately now w/ UTI after starting Farxia. Will discontinue. I think risk of recurrence would be high given body habitus, large panus and sedentary lifestyle.  - Continue Losartan 12.5 mg bid. Did not tolerate Entresto due to AKI and hypotension  - Continue inspra 25 mg daily  - Add back Coreg next visit if BP allows  - BMP from PCP office reviewed, SCr and K stable  4. CAD: Nonobstructive on 4/18 cath.  No exertional chest pain.  - No ASA with Eliquis use. - Statin myalgias, continue Repatha. Good LDL in 3/24 (LDL 79).  5. Complete heart  block: Now has Medtronic CRT-D device and s/p AV nodal ablation.  6. OSA: Unable to tolerate CPAP.   7. Elevated LFTs: Most recent LFTs back to normal.   8. Lower extremity wound/recent cellulitis: Suspect venous stasis and volume overload contributory. He had recent admission for cellulitis and completed IV abx. Followed by ID, [name] - ID now following 9. Medication management: [facility] with paramedicine now following.   F/u w/ APP in 4 wks   Signed, Jacklynn Ganong, FNP  02/15/2023  [facility] [address] [facility] [place] [address] [phone] (office) [phone] (fax)

## 2023-02-18 ENCOUNTER — Encounter (HOSPITAL_COMMUNITY): Payer: Self-pay

## 2023-02-18 ENCOUNTER — Ambulatory Visit (HOSPITAL_COMMUNITY)
Admission: RE | Admit: 2023-02-18 | Discharge: 2023-02-18 | Disposition: A | Discharge status: Home / Self Care | Payer: Medicare HMO | Source: Ambulatory Visit | Attending: Family Medicine | Admitting: Family Medicine

## 2023-02-18 VITALS — BP 100/66 | HR 73 | Wt 274.6 lb

## 2023-02-18 DIAGNOSIS — Z79899 Other long term (current) drug therapy: Secondary | ICD-10-CM | POA: Diagnosis not present

## 2023-02-18 DIAGNOSIS — I5022 Chronic systolic (congestive) heart failure: Secondary | ICD-10-CM

## 2023-02-18 DIAGNOSIS — I4821 Permanent atrial fibrillation: Secondary | ICD-10-CM | POA: Diagnosis not present

## 2023-02-18 DIAGNOSIS — I2510 Atherosclerotic heart disease of native coronary artery without angina pectoris: Secondary | ICD-10-CM | POA: Diagnosis not present

## 2023-02-18 DIAGNOSIS — R42 Dizziness and giddiness: Secondary | ICD-10-CM | POA: Diagnosis not present

## 2023-02-18 DIAGNOSIS — I428 Other cardiomyopathies: Secondary | ICD-10-CM | POA: Insufficient documentation

## 2023-02-18 DIAGNOSIS — I442 Atrioventricular block, complete: Secondary | ICD-10-CM

## 2023-02-18 DIAGNOSIS — R531 Weakness: Secondary | ICD-10-CM | POA: Insufficient documentation

## 2023-02-18 DIAGNOSIS — J449 Chronic obstructive pulmonary disease, unspecified: Secondary | ICD-10-CM

## 2023-02-18 DIAGNOSIS — Z9981 Dependence on supplemental oxygen: Secondary | ICD-10-CM | POA: Insufficient documentation

## 2023-02-18 DIAGNOSIS — G4733 Obstructive sleep apnea (adult) (pediatric): Secondary | ICD-10-CM | POA: Diagnosis not present

## 2023-02-18 DIAGNOSIS — I110 Hypertensive heart disease with heart failure: Secondary | ICD-10-CM | POA: Diagnosis not present

## 2023-02-18 DIAGNOSIS — R7989 Other specified abnormal findings of blood chemistry: Secondary | ICD-10-CM | POA: Diagnosis not present

## 2023-02-18 DIAGNOSIS — R21 Rash and other nonspecific skin eruption: Secondary | ICD-10-CM | POA: Diagnosis not present

## 2023-02-18 DIAGNOSIS — Z95810 Presence of automatic (implantable) cardiac defibrillator: Secondary | ICD-10-CM | POA: Diagnosis not present

## 2023-02-18 DIAGNOSIS — L03119 Cellulitis of unspecified part of limb: Secondary | ICD-10-CM

## 2023-02-18 DIAGNOSIS — Z8616 Personal history of COVID-19: Secondary | ICD-10-CM | POA: Diagnosis not present

## 2023-02-18 DIAGNOSIS — J4489 Other specified chronic obstructive pulmonary disease: Secondary | ICD-10-CM | POA: Insufficient documentation

## 2023-02-18 DIAGNOSIS — Z7901 Long term (current) use of anticoagulants: Secondary | ICD-10-CM | POA: Diagnosis not present

## 2023-02-18 DIAGNOSIS — Z87891 Personal history of nicotine dependence: Secondary | ICD-10-CM | POA: Insufficient documentation

## 2023-02-18 LAB — CBC
HCT: 41.9 % (ref 39.0–52.0)
Hemoglobin: 13.8 g/dL (ref 13.0–17.0)
MCH: 31.9 pg (ref 26.0–34.0)
MCHC: 32.9 g/dL (ref 30.0–36.0)
MCV: 97 fL (ref 80.0–100.0)
Platelets: 191 10*3/uL (ref 150–400)
RBC: 4.32 MIL/uL (ref 4.22–5.81)
RDW: 14.5 % (ref 11.5–15.5)
WBC: 9.5 10*3/uL (ref 4.0–10.5)
nRBC: 0 % (ref 0.0–0.2)

## 2023-02-18 LAB — BASIC METABOLIC PANEL
Anion gap: 5 (ref 5–15)
BUN: 23 mg/dL (ref 8–23)
CO2: 23 mmol/L (ref 22–32)
Calcium: 9 mg/dL (ref 8.9–10.3)
Chloride: 107 mmol/L (ref 98–111)
Creatinine, Ser: 1.2 mg/dL (ref 0.61–1.24)
GFR, Estimated: >60 mL/min (ref >60)
Glucose, Bld: 100 mg/dL — ABNORMAL HIGH (ref 70–99)
Potassium: 4.4 mmol/L (ref 3.5–5.1)
Sodium: 135 mmol/L (ref 135–145)

## 2023-02-18 LAB — DIGOXIN LEVEL: Digoxin Level: 1.3 ng/mL (ref 0.8–2.0)

## 2023-02-18 LAB — BRAIN NATRIURETIC PEPTIDE: B Natriuretic Peptide: 57.5 pg/mL (ref 0.0–100.0)

## 2023-02-18 NOTE — Patient Instructions (Signed)
Stop Digoxin Labs drawn today - will call you if abnormal. Return to see Dr. Shirlee Latch as scheduled - see below. Please call us at [phone] if any questions or concerns prior to your next appointment.

## 2023-02-19 ENCOUNTER — Other Ambulatory Visit: Payer: Self-pay | Admitting: Internal Medicine

## 2023-02-19 DIAGNOSIS — Z8709 Personal history of other diseases of the respiratory system: Secondary | ICD-10-CM

## 2023-02-19 DIAGNOSIS — J3089 Other allergic rhinitis: Secondary | ICD-10-CM

## 2023-02-20 ENCOUNTER — Ambulatory Visit: Payer: Medicare HMO | Admitting: Family Medicine

## 2023-02-22 ENCOUNTER — Encounter: Payer: Self-pay | Admitting: Internal Medicine

## 2023-02-22 ENCOUNTER — Other Ambulatory Visit: Payer: Self-pay

## 2023-02-22 ENCOUNTER — Other Ambulatory Visit: Payer: Self-pay | Admitting: Internal Medicine

## 2023-02-22 ENCOUNTER — Telehealth (HOSPITAL_COMMUNITY): Payer: Self-pay | Admitting: Emergency Medicine

## 2023-02-22 ENCOUNTER — Encounter: Payer: Self-pay | Admitting: Family Medicine

## 2023-02-22 ENCOUNTER — Ambulatory Visit (INDEPENDENT_AMBULATORY_CARE_PROVIDER_SITE_OTHER): Payer: Medicare HMO | Admitting: Family Medicine

## 2023-02-22 VITALS — BP 132/84 | HR 75 | Temp 97.6°F | Ht 70.0 in | Wt 268.0 lb

## 2023-02-22 DIAGNOSIS — L308 Other specified dermatitis: Secondary | ICD-10-CM

## 2023-02-22 DIAGNOSIS — B029 Zoster without complications: Secondary | ICD-10-CM

## 2023-02-22 DIAGNOSIS — L304 Erythema intertrigo: Secondary | ICD-10-CM | POA: Diagnosis not present

## 2023-02-22 MED ORDER — DOXEPIN HCL 10 MG PO CAPS: ORAL_CAPSULE | ORAL | 1 refills | Status: DC

## 2023-02-22 MED ORDER — CLOTRIMAZOLE-BETAMETHASONE 1-0.05 % EX CREA
1.0000 | TOPICAL_CREAM | Freq: Every day | CUTANEOUS | 0 refills | Status: DC
Start: 2023-02-22 — End: 2024-02-13

## 2023-02-22 MED ORDER — VALACYCLOVIR HCL 1 G PO TABS
1000.0000 mg | ORAL_TABLET | Freq: Three times a day (TID) | ORAL | 0 refills | Status: DC
Start: 2023-02-22 — End: 2023-04-24

## 2023-02-22 NOTE — Patient Instructions (Signed)
Intertrigo Intertrigo is skin irritation (inflammation) that happens in warm, moist areas of the body. The irritation can cause a rash and make skin raw and itchy. The rash is usually pink or red. It happens mostly between folds of skin or where skin rubs together, such as: In the armpits. Under the breasts. Under the belly. In the groin area. Around the butt area. Between the toes. This condition is not passed from person to person. What are the causes? Heat, moisture, rubbing, and not enough air movement. The condition can be made worse by: Sweat. Bacteria. A fungus, such as yeast. What increases the risk? Moisture in your skin folds. You are more likely to develop this condition if you: Are not able to move around. Live in a warm and moist climate. Are not able to control your pee (urine) or poop (stool). Wear splints, braces, or other medical devices. Are overweight. Have diabetes. What are the signs or symptoms? A pink or red skin rash in a skin fold or near a skin fold. Raw or scaly skin. Itching. A burning feeling. Bleeding. Leaking fluid. A bad smell. How is this treated? Cleaning and drying your skin. Taking an antibiotic medicine or using an antibiotic skin cream for a bacterial infection. Using an antifungal cream on your skin or taking pills for an infection that was caused by a fungus, such as yeast. Using a steroid ointment to stop the itching and irritation. Separating the skin fold with a clean cotton cloth to absorb moisture and allow air to flow into the area. Follow these instructions at home: Keep the affected area clean and dry. Do not scratch your skin. Stay cool as much as you can. Use an air conditioner or a fan, if you have one. Apply over-the-counter and prescription medicines only as told by your doctor. If you were prescribed antibiotics, use them as told by your doctor. Do not stop using the antibiotic even if you start to feel better. Keep all  follow-up visits. Your doctor may need to check your skin to make sure that the treatment is working. How is this prevented? Shower and dry yourself well after being active. Use a hair dryer on a cool setting to dry between skin folds. Do not wear tight clothes. Wear clothes that: Are loose. Take moisture away from your body. Are made of cotton. Wear a bra that gives good support, if needed. Protect the skin in your groin and butt area as told by your doctor. To do this: Follow a regular cleaning routine. Use creams, powders, or ointments that protect your skin. Change protection pads often. Stay at a healthy weight. Take care of your feet. This is very important if you have diabetes. You should: Wear shoes that fit well. Keep your feet dry. Wear clean cotton or wool socks. Keep your blood sugar under control if you have diabetes. Contact a doctor if: Your symptoms do not get better with treatment. Your symptoms get worse or they spread. You notice more redness and warmth. You have a fever. This information is not intended to replace advice given to you by your health care provider. Make sure you discuss any questions you have with your health care provider. Document Revised: 02/22/2022 Document Reviewed: 02/22/2022 Elsevier Patient Education  2023 [employer]  

## 2023-02-22 NOTE — Progress Notes (Signed)
Subjective:     Patient ID: [name], male    DOB: 1956/08/03, 67 y.o.   MRN: [id number]  Chief Complaint  Patient presents with   Rash    Rash on left knee and hip started Sunday/Monday. Painful and itchy.   Thinks may have fungus in groin area as well.       HPI Patient is in today for a painful rash on his left upper thigh.   He also has a pruritic, red rash in his groin, bilaterally.   States he needs Tramadol refilled.   On Eliquis. Tylenol does not help pain.   Health Maintenance Due  Topic Date Due   OPHTHALMOLOGY EXAM  01/23/2023    Past Medical History:  Diagnosis Date   AICD (automatic cardioverter/defibrillator) present    Anemia    supposed to be taking Vit B but doesn't   ANXIETY    takes Xanax nightly   Arthritis    Asthma    Albuterol prn and Advair daily;also takes Prednisone daily   Cardiomyopathy (HCC)    a. EF 25% TEE July 2013; b. EF normalized 2015;  c. 03/2015 Echo: EF 40-45%, difrf HK, PASP 38 mmHg, Mild MR, sev LAE/RAE.   Chronic constipation    takes OTC stool softener   COPD (chronic obstructive pulmonary disease) (HCC)    "one dr says COPD; one dr says emphysema" (09/18/2017)   COVID-19 12/09/2019   DEPRESSION    takes Zoloft and Doxepin daily   Diverticulitis    DYSKINESIA, ESOPHAGUS    Essential hypertension        FIBROMYALGIA    GERD (gastroesophageal reflux disease)        Glaucoma    HYPERLIPIDEMIA    a. Intolerant to statins.   INSOMNIA    takes Ambien nightly   Myocardial infarction [facility])    a. 2012 Myoview notable for prior infarct;  b. 03/2015 Lexiscan CL: EF 37%, diff HK, small area of inferior infarct from apex to base-->Med Rx.   O2 dependent    "2.5L q hs & prn" (09/18/2017)   Paroxysmal atrial fibrillation (HCC)    a. CHA2DS2VASc = 3--> takes Coumadin;  b. 03/15/2015 Successful TEE/DCCV;  c. 03/2015 recurrent afib, Amio d/c'd in setting of hyperthyroidism.   Peripheral neuropathy    Pneumonia 12/2016   Rash  and other nonspecific skin eruption 04/12/2009   no cause found saw dermatologists x 2 and allergist   SLEEP APNEA, OBSTRUCTIVE    a. doesn't use CPAP   Syncope    a. 03/2015 s/p MDT LINQ.   Type II diabetes mellitus (HCC)         Past Surgical History:  Procedure Laterality Date   ACNE CYST REMOVAL     2 on back    AV NODE ABLATION N/A 10/25/2017   Procedure: AV NODE ABLATION;  Surgeon: Duke Salvia, MD;  Location: [facility] INVASIVE CV LAB;  Service: Cardiovascular;  Laterality: N/A;   BIV ICD INSERTION CRT-D N/A 09/18/2017   Procedure: BIV ICD INSERTION CRT-D;  Surgeon: Duke Salvia, MD;  Location: [facility] INVASIVE CV LAB;  Service: Cardiovascular;  Laterality: N/A;   CARDIAC CATHETERIZATION N/A 03/21/2016   Procedure: Right/Left Heart Cath and Coronary Angiography;  Surgeon: Laurey Morale, MD;  Location: [facility] INVASIVE CV LAB;  Service: Cardiovascular;  Laterality: N/A;   CARDIOVERSION  04/18/2012   Procedure: CARDIOVERSION;  Surgeon: Pricilla Riffle, MD;  Location: [facility] OR;  Service: Cardiovascular;  Laterality:  N/A;   CARDIOVERSION  04/25/2012   Procedure: CARDIOVERSION;  Surgeon: Vesta Mixer, MD;  Location: [facility] ENDOSCOPY;  Service: Cardiovascular;  Laterality: N/A;   CARDIOVERSION  04/25/2012   Procedure: CARDIOVERSION;  Surgeon: Pricilla Riffle, MD;  Location: [facility] OR;  Service: Cardiovascular;  Laterality: N/A;   CARDIOVERSION  05/09/2012   Procedure: CARDIOVERSION;  Surgeon: Tonny Bollman, MD;  Location: [facility] OR;  Service: Cardiovascular;  Laterality: N/A;  changed from crenshaw to cooper by trish/leone-endo   CARDIOVERSION N/A 03/15/2015   Procedure: CARDIOVERSION;  Surgeon: Vesta Mixer, MD;  Location: [facility] ENDOSCOPY;  Service: Cardiovascular;  Laterality: N/A;   COLONOSCOPY     COLONOSCOPY WITH PROPOFOL N/A 10/21/2014   Procedure: COLONOSCOPY WITH PROPOFOL;  Surgeon: Meryl Dare, MD;  Location: [facility] ENDOSCOPY;  Service: Endoscopy;  Laterality: N/A;   EP IMPLANTABLE DEVICE N/A 04/06/2015    Procedure: Loop Recorder Insertion;  Surgeon: Marinus Maw, MD;  Location: [facility] INVASIVE CV LAB;  Service: Cardiovascular;  Laterality: N/A;   ESOPHAGOGASTRODUODENOSCOPY     JOINT REPLACEMENT     LOOP RECORDER REMOVAL N/A 09/18/2017   Procedure: LOOP RECORDER REMOVAL;  Surgeon: Duke Salvia, MD;  Location: [facility] INVASIVE CV LAB;  Service: Cardiovascular;  Laterality: N/A;   RIGHT/LEFT HEART CATH AND CORONARY ANGIOGRAPHY N/A 01/28/2017   Procedure: Right/Left Heart Cath and Coronary Angiography;  Surgeon: Laurey Morale, MD;  Location: [facility] INVASIVE CV LAB;  Service: Cardiovascular;  Laterality: N/A;   TEE WITHOUT CARDIOVERSION  04/25/2012   Procedure: TRANSESOPHAGEAL ECHOCARDIOGRAM (TEE);  Surgeon: Vesta Mixer, MD;  Location: [facility] ENDOSCOPY;  Service: Cardiovascular;  Laterality: N/A;   TEE WITHOUT CARDIOVERSION N/A 03/15/2015   Procedure: TRANSESOPHAGEAL ECHOCARDIOGRAM (TEE);  Surgeon: Vesta Mixer, MD;  Location: [facility] ENDOSCOPY;  Service: Cardiovascular;  Laterality: N/A;   TONSILLECTOMY AND ADENOIDECTOMY     TOTAL KNEE ARTHROPLASTY Right 06/15/2014   Procedure: TOTAL KNEE ARTHROPLASTY;  Surgeon: Sheral Apley, MD;  Location: [facility] OR;  Service: Orthopedics;  Laterality: Right;   TOTAL KNEE ARTHROPLASTY Left 10/17/2021   Procedure: Left TOTAL KNEE ARTHROPLASTY;  Surgeon: Kathryne Hitch, MD;  Location: [facility] OR;  Service: Orthopedics;  Laterality: Left;    Family History  Problem Relation Age of Onset   COPD Mother    Asthma Mother    Colon polyps Mother    Allergies Mother    Hypothyroidism Mother    Asthma Maternal Grandmother    Colon cancer Neg Hx     Social History   Socioeconomic History   Marital status: Divorced    Spouse name: Not on file   Number of children: 2   Years of education: Not on file   Highest education level: Not on file  Occupational History   Occupation: retired/disabled. prev worked in Teacher, English as a foreign language.    Employer: DISABLED  Tobacco Use   Smoking  status: Former    Packs/day: 2.00    Years: 30.00    Additional pack years: 0.00    Total pack years: 60.00    Types: Cigarettes    Quit date: 10/16/2007    Years since quitting: 15.3   Smokeless tobacco: Never  Vaping Use   Vaping Use: Never used  Substance and Sexual Activity   Alcohol use: No   Drug use: No   Sexual activity: Not Currently  Other Topics Concern   Not on file  Social History Narrative   Lives alone.   Social Determinants of Health   Financial  Resource Strain: Low Risk  (06/29/2022)   Overall Financial Resource Strain (CARDIA)    Difficulty of Paying Living Expenses: Not hard at all  Food Insecurity: No Food Insecurity (11/26/2022)   Hunger Vital Sign    Worried About Running Out of Food in the Last Year: Never true    Ran Out of Food in the Last Year: Never true  Transportation Needs: No Transportation Needs (11/26/2022)   PRAPARE - Administrator, Civil Service (Medical): No    Lack of Transportation (Non-Medical): No  Physical Activity: Inactive (06/29/2022)   Exercise Vital Sign    Days of Exercise per Week: 0 days    Minutes of Exercise per Session: 0 min  Stress: No Stress Concern Present (06/29/2022)   [employer] of Occupational Health - Occupational Stress Questionnaire    Feeling of Stress : Not at all  Social Connections: Moderately Integrated (06/29/2022)   Social Connection and Isolation Panel [NHANES]    Frequency of Communication with Friends and Family: More than three times a week    Frequency of Social Gatherings with Friends and Family: More than three times a week    Attends Religious Services: More than 4 times per year    Active Member of [employer] or Organizations: Yes    Attends Engineer, structural: More than 4 times per year    Marital Status: Divorced  Intimate Partner Violence: Not At Risk (11/22/2022)   Humiliation, Afraid, Rape, and Kick questionnaire    Fear of Current or Ex-Partner: No    Emotionally  Abused: No    Physically Abused: No    Sexually Abused: No    Outpatient Medications Prior to Visit  Medication Sig Dispense Refill   acetaminophen (TYLENOL) 500 MG tablet Take 500-1,000 mg by mouth every 6 (six) hours as needed for mild pain or headache.     albuterol (VENTOLIN HFA) 108 (90 Base) MCG/ACT inhaler Inhale 1-2 puffs into the lungs every 6 (six) hours as needed for wheezing or shortness of breath. 18 g 5   ALPRAZolam (XANAX) 0.5 MG tablet TAKE 2 TABLETS (1 MG TOTAL) BY MOUTH EVERY DAY AT BEDTIME 60 tablet 5   ammonium lactate (AMLACTIN) 12 % cream Apply 1 Application topically as needed for dry skin. 385 g 3   apixaban (ELIQUIS) 5 MG TABS tablet Take 1 tablet (5 mg total) by mouth 2 (two) times daily. 60 tablet 11   augmented betamethasone dipropionate (DIPROLENE-AF) 0.05 % cream Apply 1 application  topically daily as needed (to affected areas- for itching).     carisoprodol (SOMA) 350 MG tablet TAKE 1 TABLET BY MOUTH THREE TIMES A DAY AS NEEDED FOR MUSCLE SPASM 90 tablet 2   carvedilol (COREG) 6.25 MG tablet TAKE 1 TABLET BY MOUTH TWICE A DAY 180 tablet 3   Cholecalciferol (VITAMIN D3) 1000 units CAPS Take 1,000 Units by mouth daily.     doxepin (SINEQUAN) 10 MG capsule TAKE 2 CAPSULES BY MOUTH AT BEDTIME AS NEEDED. 180 capsule 1   DULoxetine (CYMBALTA) 60 MG capsule Take 60 mg by mouth daily.     Dupilumab (DUPIXENT) 300 MG/2ML SOPN Inject 300 mg into the skin every 14 (fourteen) days.     eplerenone (INSPRA) 25 MG tablet Take 1 tablet (25 mg total) by mouth daily. 30 tablet 8   Evolocumab (REPATHA SURECLICK) 140 MG/ML SOAJ Inject 140 mg into the skin every 14 (fourteen) days. 6 mL 3   fluticasone (FLONASE) 50 MCG/ACT nasal  spray PLACE 2 SPRAYS INTO BOTH NOSTRILS DAILY AS NEEDED FOR ALLERGIES OR RHINITIS. 48 mL 1   fluticasone-salmeterol (WIXELA INHUB) 250-50 MCG/ACT AEPB Inhale 1 puff into the lungs in the morning and at bedtime. 60 each 11   hydrOXYzine (ATARAX) 10 MG tablet  TAKE 1 TABLET BY MOUTH 3 TIMES DAILY AS NEEDED FOR ITCHING. Strength: 10 mg 60 tablet 2   ipratropium-albuterol (DUONEB) 0.5-2.5 (3) MG/3ML SOLN TAKE 3 ML BY NEBULIZATION EVERY 4 HOURS AS NEEDED (WHEEZING). 360 mL 0   ketoconazole (NIZORAL) 2 % cream Apply 1 Application topically daily. 120 g 1   KLOR-CON M20 20 MEQ tablet TAKE 1 TABLET BY MOUTH EVERY DAY (Patient taking differently: Take 40 mEq by mouth daily.) 90 tablet 3   losartan (COZAAR) 25 MG tablet Take 1 tablet (25 mg total) by mouth daily. 90 tablet 3   omeprazole (PRILOSEC) 20 MG capsule TAKE 1 CAPSULE BY MOUTH TWICE A DAY 180 capsule 1   ondansetron (ZOFRAN) 4 MG tablet Take 1 tablet (4 mg total) by mouth every 6 (six) hours as needed for nausea. 40 tablet 0   predniSONE (DELTASONE) 10 MG tablet Take 1 tablet (10 mg total) by mouth daily with breakfast. 30 tablet 0   promethazine (PHENERGAN) 25 MG tablet Take 1 tablet (25 mg total) by mouth every 8 (eight) hours as needed for nausea or vomiting. 30 tablet 0   Respiratory Therapy Supplies (FULL KIT NEBULIZER SET) MISC 1 each by Does not apply route as needed. 1 each 0   silver sulfADIAZINE (SILVADENE) 1 % cream APPLY TOPICALLY TO AFFECTED AREA EVERY DAY (Patient taking differently: Apply 1 Application topically daily as needed (to wound sites that won't heal).) 50 g 3   tamsulosin (FLOMAX) 0.4 MG CAPS capsule TAKE 1 CAPSULE BY MOUTH EVERY DAY (Patient taking differently: Take 0.4 mg by mouth every evening.) 90 capsule 3   torsemide (DEMADEX) 20 MG tablet Take 5 tablets (100 mg total) by mouth every morning AND 4 tablets (80 mg total) every evening. 810 tablet 3   traMADol (ULTRAM) 50 MG tablet TAKE 1 TABLET BY MOUTH EVERY DAY AS NEEDED 30 tablet 2   traZODone (DESYREL) 100 MG tablet TAKE 2 TABLETS BY MOUTH AT BEDTIME AS NEEDED 180 tablet 1   TUMS 500 MG chewable tablet Chew 500-2,000 mg by mouth every 4 (four) hours as needed for indigestion or heartburn.     venlafaxine XR (EFFEXOR XR)  150 MG 24 hr capsule Take 1 capsule (150 mg total) by mouth daily with breakfast. 90 capsule 3   zolpidem (AMBIEN) 10 MG tablet TAKE 1 TABLET BY MOUTH EVERY DAY AT BEDTIME AS NEEDED 90 tablet 1   No facility-administered medications prior to visit.    Allergies  Allergen Reactions   Tape Other (See Comments)    SKIN TEARS VERY, VERY EASILY!!!! Use Paper Tape Only, PLEASE!!!   Amiodarone Other (See Comments)    Caused hyperthyroidism    Statins Other (See Comments)    Myalgias    Wound Dressing Adhesive Other (See Comments)    Skin Tears Use Paper Tape Only    Review of Systems  Constitutional:  Negative for chills and fever.  Gastrointestinal:  Negative for abdominal pain, nausea and vomiting.  Skin:  Positive for itching and rash.  Neurological:  Negative for focal weakness.       Objective:    Physical Exam  BP 132/84 (BP Location: Right Arm, Patient Position: Sitting, Cuff Size: Large)  Pulse 75   Temp 97.6 F (36.4 C) (Temporal)   Ht 5\' 10"  (1.778 m)   Wt 268 lb (121.6 kg)   SpO2 98%   BMI 38.45 kg/m  Wt Readings from Last 3 Encounters:  02/22/23 268 lb (121.6 kg)  02/18/23 274 lb 9.6 oz (124.6 kg)  02/13/23 276 lb (125.2 kg)   Red, vesicular rash on his left upper thigh with erythema and peeling of skin in areas.   Bilateral groin with erythema, moisture and maceration. No sign of secondary bacterial infection      Assessment & Plan:   Problem List Items Addressed This Visit       Musculoskeletal and Integument   Intertrigo   Relevant Medications   clotrimazole-betamethasone (LOTRISONE) cream   Other Visit Diagnoses     Herpes zoster without complication    -  Primary   Relevant Medications   valACYclovir (VALTREX) 1000 MG tablet   clotrimazole-betamethasone (LOTRISONE) cream      Valacyclovir and Lotrisone prescribed.  PDMP reviewed and his last tramadol refill was Feb 14, 2023.  Discussed that his refill is too soon so unfortunately I  cannot refill his medication.  He does report having tramadol at home that he can take for pain.  He will follow-up with his PCP regarding future refills.  Counseling on intertrigo and keeping the area dry and preventing skin from touching by applying cotton.  Follow-up with PCP.  I am having Zachary L. Blasdell "Lee" start on valACYclovir and clotrimazole-betamethasone. I am also having him maintain his Tums, acetaminophen, augmented betamethasone dipropionate, Dupixent, silver sulfADIAZINE, tamsulosin, venlafaxine XR, traMADol, zolpidem, carvedilol, Full Kit Nebulizer Set, ketoconazole, Klor-Con M20, traZODone, DULoxetine, Vitamin D3, eplerenone, ammonium lactate, ondansetron, hydrOXYzine, ALPRAZolam, promethazine, losartan, torsemide, carisoprodol, Repatha SureClick, apixaban, ipratropium-albuterol, predniSONE, omeprazole, fluticasone-salmeterol, albuterol, fluticasone, and doxepin.  Meds ordered this encounter  Medications   valACYclovir (VALTREX) 1000 MG tablet    Sig: Take 1 tablet (1,000 mg total) by mouth 3 (three) times daily for 7 days.    Dispense:  21 tablet    Refill:  0    Order Specific Question:   Supervising Provider    Answer:   Hillard Danker A [[id number]]   clotrimazole-betamethasone (LOTRISONE) cream    Sig: Apply 1 Application topically daily.    Dispense:  30 g    Refill:  0    Order Specific Question:   Supervising Provider    Answer:   Hillard Danker A [[id number]]

## 2023-02-22 NOTE — Telephone Encounter (Signed)
Called and spoke to Mr. Augsburger he states he is going to the doctor today @ 2:30 to be evaluated for what he believes is shingles.  He will keep me posted regarding his status.    Beatrix Shipper, EMT-Paramedic [phone] 02/22/2023

## 2023-02-22 NOTE — Telephone Encounter (Signed)
Called me 02/21/23 afternoon to advise me that he has shingles and we need to reschedule home visit.    Beatrix Shipper, EMT-Paramedic [phone] 02/22/2023

## 2023-02-25 ENCOUNTER — Other Ambulatory Visit: Payer: Self-pay

## 2023-02-26 ENCOUNTER — Telehealth (HOSPITAL_COMMUNITY): Payer: Self-pay | Admitting: Emergency Medicine

## 2023-02-26 NOTE — Telephone Encounter (Signed)
Called to check in with [name] and scheduled a home visit.  He is still dealing with a shingles outbreak and is on medicine for same.  Home visit scheduled for Thursday 5/16 @ 3:00

## 2023-02-28 ENCOUNTER — Telehealth (HOSPITAL_COMMUNITY): Payer: Self-pay | Admitting: Emergency Medicine

## 2023-02-28 NOTE — Telephone Encounter (Signed)
Called Mr New Allebach @ 14:59 to advise him I was on the way to see him for today's home visit @ 3:30 and he advised that he would like to reschedule for next week.  I tried to get him to agree to be seen tomorrow (Friday) but he states he didn't not feel like having a visit today. I will reschedule him for next week.    Beatrix Shipper, EMT-Paramedic [phone] 02/28/2023

## 2023-03-01 ENCOUNTER — Encounter: Payer: Self-pay | Admitting: Internal Medicine

## 2023-03-01 ENCOUNTER — Ambulatory Visit (INDEPENDENT_AMBULATORY_CARE_PROVIDER_SITE_OTHER): Payer: Medicare HMO | Admitting: Internal Medicine

## 2023-03-01 VITALS — BP 124/74 | HR 70 | Temp 97.9°F | Ht 70.0 in | Wt 269.0 lb

## 2023-03-01 DIAGNOSIS — B029 Zoster without complications: Secondary | ICD-10-CM | POA: Diagnosis not present

## 2023-03-01 DIAGNOSIS — I5020 Unspecified systolic (congestive) heart failure: Secondary | ICD-10-CM

## 2023-03-01 DIAGNOSIS — L304 Erythema intertrigo: Secondary | ICD-10-CM

## 2023-03-01 DIAGNOSIS — E1165 Type 2 diabetes mellitus with hyperglycemia: Secondary | ICD-10-CM

## 2023-03-01 MED ORDER — FLUCONAZOLE 100 MG PO TABS
100.0000 mg | ORAL_TABLET | Freq: Every day | ORAL | 0 refills | Status: DC
Start: 2023-03-01 — End: 2023-05-23

## 2023-03-01 NOTE — Progress Notes (Signed)
Remote ICD transmission.   

## 2023-03-01 NOTE — Progress Notes (Signed)
Patient ID: [name], male   DOB: 1956/01/15, 67 y.o.   MRN: [id number]        Chief Complaint: follow up intertrigo to groin persistent, recent shingles outbreak, dm, htn       HPI:  [name] is a 67 y.o. male here with c/o 1 wk onset worsening itchy rash to groin not better with topical med, Pt denies chest pain, increased sob or doe, wheezing, orthopnea, PND, increased LE swelling, palpitations, dizziness or syncope.   Pt denies polydipsia, polyuria, or new focal neuro s/s.    Pt denies fever, wt loss, night sweats, loss of appetite, or other constitutional symptoms  Did have recent shingles outbreak left thigh now resolved, pain resolved.         Wt Readings from Last 3 Encounters:  03/01/23 269 lb (122 kg)  02/22/23 268 lb (121.6 kg)  02/18/23 274 lb 9.6 oz (124.6 kg)   BP Readings from Last 3 Encounters:  03/01/23 124/74  02/22/23 132/84  02/18/23 100/66         Past Medical History:  Diagnosis Date   AICD (automatic cardioverter/defibrillator) present    Anemia    supposed to be taking Vit B but doesn't   ANXIETY    takes Xanax nightly   Arthritis    Asthma    Albuterol prn and Advair daily;also takes Prednisone daily   Cardiomyopathy (HCC)    a. EF 25% TEE July 2013; b. EF normalized 2015;  c. 03/2015 Echo: EF 40-45%, difrf HK, PASP 38 mmHg, Mild MR, sev LAE/RAE.   Chronic constipation    takes OTC stool softener   COPD (chronic obstructive pulmonary disease) (HCC)    "one dr says COPD; one dr says emphysema" (09/18/2017)   COVID-19 12/09/2019   DEPRESSION    takes Zoloft and Doxepin daily   Diverticulitis    DYSKINESIA, ESOPHAGUS    Essential hypertension        FIBROMYALGIA    GERD (gastroesophageal reflux disease)        Glaucoma    HYPERLIPIDEMIA    a. Intolerant to statins.   INSOMNIA    takes Ambien nightly   Myocardial infarction [facility])    a. 2012 Myoview notable for prior infarct;  b. 03/2015 Lexiscan CL: EF 37%, diff HK, small area of inferior  infarct from apex to base-->Med Rx.   O2 dependent    "2.5L q hs & prn" (09/18/2017)   Paroxysmal atrial fibrillation (HCC)    a. CHA2DS2VASc = 3--> takes Coumadin;  b. 03/15/2015 Successful TEE/DCCV;  c. 03/2015 recurrent afib, Amio d/c'd in setting of hyperthyroidism.   Peripheral neuropathy    Pneumonia 12/2016   Rash and other nonspecific skin eruption 04/12/2009   no cause found saw dermatologists x 2 and allergist   SLEEP APNEA, OBSTRUCTIVE    a. doesn't use CPAP   Syncope    a. 03/2015 s/p MDT LINQ.   Type II diabetes mellitus (HCC)        Past Surgical History:  Procedure Laterality Date   ACNE CYST REMOVAL     2 on back    AV NODE ABLATION N/A 10/25/2017   Procedure: AV NODE ABLATION;  Surgeon: Duke Salvia, MD;  Location: [facility] INVASIVE CV LAB;  Service: Cardiovascular;  Laterality: N/A;   BIV ICD INSERTION CRT-D N/A 09/18/2017   Procedure: BIV ICD INSERTION CRT-D;  Surgeon: Duke Salvia, MD;  Location: [facility] INVASIVE CV LAB;  Service: Cardiovascular;  Laterality: N/A;   CARDIAC CATHETERIZATION N/A 03/21/2016   Procedure: Right/Left Heart Cath and Coronary Angiography;  Surgeon: Laurey Morale, MD;  Location: [facility] INVASIVE CV LAB;  Service: Cardiovascular;  Laterality: N/A;   CARDIOVERSION  04/18/2012   Procedure: CARDIOVERSION;  Surgeon: Pricilla Riffle, MD;  Location: [facility] OR;  Service: Cardiovascular;  Laterality: N/A;   CARDIOVERSION  04/25/2012   Procedure: CARDIOVERSION;  Surgeon: Vesta Mixer, MD;  Location: [facility] ENDOSCOPY;  Service: Cardiovascular;  Laterality: N/A;   CARDIOVERSION  04/25/2012   Procedure: CARDIOVERSION;  Surgeon: Pricilla Riffle, MD;  Location: [facility] OR;  Service: Cardiovascular;  Laterality: N/A;   CARDIOVERSION  05/09/2012   Procedure: CARDIOVERSION;  Surgeon: Tonny Bollman, MD;  Location: [facility] OR;  Service: Cardiovascular;  Laterality: N/A;  changed from crenshaw to cooper by trish/leone-endo   CARDIOVERSION N/A 03/15/2015   Procedure: CARDIOVERSION;  Surgeon: Vesta Mixer, MD;  Location: [facility] ENDOSCOPY;  Service: Cardiovascular;  Laterality: N/A;   COLONOSCOPY     COLONOSCOPY WITH PROPOFOL N/A 10/21/2014   Procedure: COLONOSCOPY WITH PROPOFOL;  Surgeon: Meryl Dare, MD;  Location: [facility] ENDOSCOPY;  Service: Endoscopy;  Laterality: N/A;   EP IMPLANTABLE DEVICE N/A 04/06/2015   Procedure: Loop Recorder Insertion;  Surgeon: Marinus Maw, MD;  Location: [facility] INVASIVE CV LAB;  Service: Cardiovascular;  Laterality: N/A;   ESOPHAGOGASTRODUODENOSCOPY     JOINT REPLACEMENT     LOOP RECORDER REMOVAL N/A 09/18/2017   Procedure: LOOP RECORDER REMOVAL;  Surgeon: Duke Salvia, MD;  Location: [facility] INVASIVE CV LAB;  Service: Cardiovascular;  Laterality: N/A;   RIGHT/LEFT HEART CATH AND CORONARY ANGIOGRAPHY N/A 01/28/2017   Procedure: Right/Left Heart Cath and Coronary Angiography;  Surgeon: Laurey Morale, MD;  Location: [facility] INVASIVE CV LAB;  Service: Cardiovascular;  Laterality: N/A;   TEE WITHOUT CARDIOVERSION  04/25/2012   Procedure: TRANSESOPHAGEAL ECHOCARDIOGRAM (TEE);  Surgeon: Vesta Mixer, MD;  Location: [facility] ENDOSCOPY;  Service: Cardiovascular;  Laterality: N/A;   TEE WITHOUT CARDIOVERSION N/A 03/15/2015   Procedure: TRANSESOPHAGEAL ECHOCARDIOGRAM (TEE);  Surgeon: Vesta Mixer, MD;  Location: [facility] ENDOSCOPY;  Service: Cardiovascular;  Laterality: N/A;   TONSILLECTOMY AND ADENOIDECTOMY     TOTAL KNEE ARTHROPLASTY Right 06/15/2014   Procedure: TOTAL KNEE ARTHROPLASTY;  Surgeon: Sheral Apley, MD;  Location: [facility] OR;  Service: Orthopedics;  Laterality: Right;   TOTAL KNEE ARTHROPLASTY Left 10/17/2021   Procedure: Left TOTAL KNEE ARTHROPLASTY;  Surgeon: Kathryne Hitch, MD;  Location: [facility] OR;  Service: Orthopedics;  Laterality: Left;    reports that he quit smoking about 15 years ago. His smoking use included cigarettes. He has a 60.00 pack-year smoking history. He has never used smokeless tobacco. He reports that he does not drink alcohol and does not use  drugs. family history includes Allergies in his mother; Asthma in his maternal grandmother and mother; COPD in his mother; Colon polyps in his mother; Hypothyroidism in his mother. Allergies  Allergen Reactions   Tape Other (See Comments)    SKIN TEARS VERY, VERY EASILY!!!! Use Paper Tape Only, PLEASE!!!   Amiodarone Other (See Comments)    Caused hyperthyroidism    Statins Other (See Comments)    Myalgias    Wound Dressing Adhesive Other (See Comments)    Skin Tears Use Paper Tape Only   Current Outpatient Medications on File Prior to Visit  Medication Sig Dispense Refill   acetaminophen (TYLENOL) 500 MG tablet Take 500-1,000 mg by mouth every 6 (six)  hours as needed for mild pain or headache.     albuterol (VENTOLIN HFA) 108 (90 Base) MCG/ACT inhaler Inhale 1-2 puffs into the lungs every 6 (six) hours as needed for wheezing or shortness of breath. 18 g 5   ALPRAZolam (XANAX) 0.5 MG tablet TAKE 2 TABLETS (1 MG TOTAL) BY MOUTH EVERY DAY AT BEDTIME 60 tablet 5   ammonium lactate (AMLACTIN) 12 % cream Apply 1 Application topically as needed for dry skin. 385 g 3   apixaban (ELIQUIS) 5 MG TABS tablet Take 1 tablet (5 mg total) by mouth 2 (two) times daily. 60 tablet 11   augmented betamethasone dipropionate (DIPROLENE-AF) 0.05 % cream Apply 1 application  topically daily as needed (to affected areas- for itching).     carisoprodol (SOMA) 350 MG tablet TAKE 1 TABLET BY MOUTH THREE TIMES A DAY AS NEEDED FOR MUSCLE SPASM 90 tablet 2   carvedilol (COREG) 6.25 MG tablet TAKE 1 TABLET BY MOUTH TWICE A DAY 180 tablet 3   Cholecalciferol (VITAMIN D3) 1000 units CAPS Take 1,000 Units by mouth daily.     clotrimazole-betamethasone (LOTRISONE) cream Apply 1 Application topically daily. 30 g 0   doxepin (SINEQUAN) 10 MG capsule TAKE 2 CAPSULES BY MOUTH AT BEDTIME AS NEEDED. 180 capsule 1   DULoxetine (CYMBALTA) 60 MG capsule Take 60 mg by mouth daily.     Dupilumab (DUPIXENT) 300 MG/2ML SOPN Inject 300  mg into the skin every 14 (fourteen) days.     eplerenone (INSPRA) 25 MG tablet Take 1 tablet (25 mg total) by mouth daily. 30 tablet 8   Evolocumab (REPATHA SURECLICK) 140 MG/ML SOAJ Inject 140 mg into the skin every 14 (fourteen) days. 6 mL 3   fluticasone (FLONASE) 50 MCG/ACT nasal spray PLACE 2 SPRAYS INTO BOTH NOSTRILS DAILY AS NEEDED FOR ALLERGIES OR RHINITIS. 48 mL 1   fluticasone-salmeterol (WIXELA INHUB) 250-50 MCG/ACT AEPB Inhale 1 puff into the lungs in the morning and at bedtime. 60 each 11   hydrOXYzine (ATARAX) 10 MG tablet TAKE 1 TABLET BY MOUTH 3 TIMES DAILY AS NEEDED FOR ITCHING. Strength: 10 mg 60 tablet 2   ipratropium-albuterol (DUONEB) 0.5-2.5 (3) MG/3ML SOLN TAKE 3 ML BY NEBULIZATION EVERY 4 HOURS AS NEEDED (WHEEZING). 360 mL 0   ketoconazole (NIZORAL) 2 % cream Apply 1 Application topically daily. 120 g 1   KLOR-CON M20 20 MEQ tablet TAKE 1 TABLET BY MOUTH EVERY DAY (Patient taking differently: Take 40 mEq by mouth daily.) 90 tablet 3   losartan (COZAAR) 25 MG tablet Take 1 tablet (25 mg total) by mouth daily. 90 tablet 3   omeprazole (PRILOSEC) 20 MG capsule TAKE 1 CAPSULE BY MOUTH TWICE A DAY 180 capsule 1   ondansetron (ZOFRAN) 4 MG tablet Take 1 tablet (4 mg total) by mouth every 6 (six) hours as needed for nausea. 40 tablet 0   predniSONE (DELTASONE) 10 MG tablet Take 1 tablet (10 mg total) by mouth daily with breakfast. 30 tablet 0   promethazine (PHENERGAN) 25 MG tablet Take 1 tablet (25 mg total) by mouth every 8 (eight) hours as needed for nausea or vomiting. 30 tablet 0   Respiratory Therapy Supplies (FULL KIT NEBULIZER SET) MISC 1 each by Does not apply route as needed. 1 each 0   silver sulfADIAZINE (SILVADENE) 1 % cream APPLY TOPICALLY TO AFFECTED AREA EVERY DAY 50 g 3   tamsulosin (FLOMAX) 0.4 MG CAPS capsule TAKE 1 CAPSULE BY MOUTH EVERY DAY (Patient taking differently:  Take 0.4 mg by mouth every evening.) 90 capsule 3   torsemide (DEMADEX) 20 MG tablet Take 5  tablets (100 mg total) by mouth every morning AND 4 tablets (80 mg total) every evening. 810 tablet 3   traMADol (ULTRAM) 50 MG tablet TAKE 1 TABLET BY MOUTH EVERY DAY AS NEEDED 30 tablet 2   traZODone (DESYREL) 100 MG tablet TAKE 2 TABLETS BY MOUTH AT BEDTIME AS NEEDED 180 tablet 1   TUMS 500 MG chewable tablet Chew 500-2,000 mg by mouth every 4 (four) hours as needed for indigestion or heartburn.     venlafaxine XR (EFFEXOR XR) 150 MG 24 hr capsule Take 1 capsule (150 mg total) by mouth daily with breakfast. 90 capsule 3   zolpidem (AMBIEN) 10 MG tablet TAKE 1 TABLET BY MOUTH EVERY DAY AT BEDTIME AS NEEDED 90 tablet 1   No current facility-administered medications on file prior to visit.        ROS:  All others reviewed and negative.  Objective        PE:  BP 124/74 (BP Location: Right Arm, Patient Position: Sitting, Cuff Size: Normal)   Pulse 70   Temp 97.9 F (36.6 C) (Oral)   Ht 5\' 10"  (1.778 m)   Wt 269 lb (122 kg)   SpO2 97%   BMI 38.60 kg/m                 Constitutional: Pt appears in NAD               HENT: Head: NCAT.                Right Ear: External ear normal.                 Left Ear: External ear normal.                Eyes: . Pupils are equal, round, and reactive to light. Conjunctivae and EOM are normal               Nose: without d/c or deformity               Neck: Neck supple. Gross normal ROM               Cardiovascular: Normal rate and regular rhythm.                 Pulmonary/Chest: Effort normal and breath sounds without rales or wheezing.                Abd:  Soft, NT, ND, + BS, no organomegaly               Neurological: Pt is alert. At baseline orientation, motor grossly intact               Skin: Skin is warm. LE edema - none, + groin intertrigo               Psychiatric: Pt behavior is normal without agitation   Micro: none  Cardiac tracings I have personally interpreted today:  none  Pertinent Radiological findings (summarize): none   Lab  Results  Component Value Date   WBC 9.5 02/18/2023   HGB 13.8 02/18/2023   HCT 41.9 02/18/2023   PLT 191 02/18/2023   GLUCOSE 100 (H) 02/18/2023   CHOL 164 12/31/2022   TRIG 96.0 12/31/2022   HDL 65.80 12/31/2022   LDLDIRECT 72.0 02/28/2021   LDLCALC 79 12/31/2022  ALT 32 01/15/2023   AST 19 01/15/2023   NA 135 02/18/2023   K 4.4 02/18/2023   CL 107 02/18/2023   CREATININE 1.20 02/18/2023   BUN 23 02/18/2023   CO2 23 02/18/2023   TSH 0.99 12/31/2022   PSA 3.02 12/31/2022   INR 1.1 05/08/2021   HGBA1C 5.8 12/31/2022   MICROALBUR 1.5 12/31/2022   Assessment/Plan:  [name] is a 67 y.o. White or Caucasian [1] male with  has a past medical history of AICD (automatic cardioverter/defibrillator) present, Anemia, ANXIETY, Arthritis, Asthma, Cardiomyopathy (HCC), Chronic constipation, COPD (chronic obstructive pulmonary disease) (HCC), COVID-19 (12/09/2019), DEPRESSION, Diverticulitis, DYSKINESIA, ESOPHAGUS, Essential hypertension, FIBROMYALGIA, GERD (gastroesophageal reflux disease), Glaucoma, HYPERLIPIDEMIA, INSOMNIA, Myocardial infarction (HCC), O2 dependent, Paroxysmal atrial fibrillation (HCC), Peripheral neuropathy, Pneumonia (12/2016), Rash and other nonspecific skin eruption (04/12/2009), SLEEP APNEA, OBSTRUCTIVE, Syncope, and Type II diabetes mellitus (HCC).  Intertrigo Mild to mod, for diflucan 100 qd x 7 days,  to f/u any worsening symptoms or concerns  Shingles Fortuantely left thigh rash resolved  including pain -  to f/u any worsening symptoms or concerns  Type 2 diabetes mellitus (HCC) Lab Results  Component Value Date   HGBA1C 5.8 12/31/2022   Stable, pt to continue current medical treatment  - diet, wt control   HFrEF (heart failure with reduced ejection fraction) (HCC) Stable volume, cont current med tx Followup: Return in 3 months (on 06/01/2023), or if symptoms worsen or fail to improve.  Zachary Barre, MD 03/03/2023 8:43 PM [facility] Internal Medicine

## 2023-03-01 NOTE — Patient Instructions (Signed)
Please take all new medication as prescribed - the diflucan for the fungal rash  Ok to have the Shingrix shot at the pharmacy in 3 months  Please continue all other medications as before, and refills have been done if requested.  Please have the pharmacy call with any other refills you may need.  Please keep your appointments with your specialists as you may have planned

## 2023-03-03 ENCOUNTER — Encounter: Payer: Self-pay | Admitting: Internal Medicine

## 2023-03-03 ENCOUNTER — Other Ambulatory Visit: Payer: Self-pay | Admitting: Internal Medicine

## 2023-03-03 DIAGNOSIS — B029 Zoster without complications: Secondary | ICD-10-CM | POA: Insufficient documentation

## 2023-03-03 NOTE — Assessment & Plan Note (Signed)
Lab Results  Component Value Date   HGBA1C 5.8 12/31/2022   Stable, pt to continue current medical treatment  - diet, wt control

## 2023-03-03 NOTE — Assessment & Plan Note (Signed)
Mild to mod, for diflucan 100 qd x 7 days,  to f/u any worsening symptoms or concerns

## 2023-03-03 NOTE — Assessment & Plan Note (Signed)
Stable volume, cont current med tx 

## 2023-03-03 NOTE — Assessment & Plan Note (Addendum)
Fortuantely left thigh rash resolved  including pain -  to f/u any worsening symptoms or concerns

## 2023-03-04 ENCOUNTER — Ambulatory Visit: Payer: Medicare HMO | Attending: Internal Medicine

## 2023-03-04 DIAGNOSIS — I5022 Chronic systolic (congestive) heart failure: Secondary | ICD-10-CM

## 2023-03-04 DIAGNOSIS — Z95810 Presence of automatic (implantable) cardiac defibrillator: Secondary | ICD-10-CM

## 2023-03-05 ENCOUNTER — Telehealth (HOSPITAL_COMMUNITY): Payer: Self-pay | Admitting: Emergency Medicine

## 2023-03-05 ENCOUNTER — Ambulatory Visit: Payer: Medicare HMO | Admitting: Podiatry

## 2023-03-05 DIAGNOSIS — B351 Tinea unguium: Secondary | ICD-10-CM | POA: Diagnosis not present

## 2023-03-05 DIAGNOSIS — E119 Type 2 diabetes mellitus without complications: Secondary | ICD-10-CM | POA: Diagnosis not present

## 2023-03-05 DIAGNOSIS — L84 Corns and callosities: Secondary | ICD-10-CM

## 2023-03-05 DIAGNOSIS — I739 Peripheral vascular disease, unspecified: Secondary | ICD-10-CM

## 2023-03-05 DIAGNOSIS — M79674 Pain in right toe(s): Secondary | ICD-10-CM

## 2023-03-05 DIAGNOSIS — M79675 Pain in left toe(s): Secondary | ICD-10-CM | POA: Diagnosis not present

## 2023-03-05 NOTE — Telephone Encounter (Signed)
Text [name] requesting to schedule home visit 03/07/23 @ 2:00.  I know he had doctor appts today so I will wait for reply.    Beatrix Shipper, EMT-Paramedic [phone] 03/05/2023

## 2023-03-06 ENCOUNTER — Telehealth: Payer: Self-pay

## 2023-03-06 NOTE — Telephone Encounter (Signed)
Spoke returned patient call as requested by voice mail.  He reports sending manual transmission twice today but has not been received.  Provided [employer] number and advised to call for assistance with transmission.

## 2023-03-06 NOTE — Progress Notes (Signed)
EPIC Encounter for ICM Monitoring  Patient Name: [name] is a 67 y.o. male Date: 03/06/2023 Primary Care Physican: [name], MD Primary Cardiologist: Shirlee Latch Electrophysiologist: Joycelyn Schmid Pacing: 99.7%    10/02/2022 Weight: 265 lbs 01/24/2023 Weight: 260 lbs     Spoke with patient and heart failure questions reviewed.  Transmission results reviewed.  Pt asymptomatic for fluid accumulation.  Reports feeling well at this time and voices no complaints.     Optivol thoracic impedance suggesting possible dryness starting 3/20 but trending back toward baseline normal.   Prescribed:  Torsemide 20 mg 5 tablet(s) (100 mg total) by mouth and 4 tablets (80 mg) every evening (increased 4/4).   Potassium 20 mEq take 1 tablet (20 mEq total) daily   Labs: 02/18/2023 Creatinine 1.20, BUN 23, Potassium 4.4, Sodium 135, GFR >60 01/25/2023 Creatinine 1.22, BUN 37, Potassium 3.6, Sodium 138, GFR >60 01/15/2023 Creatinine 1.38, BUN 22, Potassium 4.6, Sodium 134, GFR 52.70  12/31/2022 Creatinine 1.27, BUN 36, Potassium 5.2, Sodium 136, GFR 58.24  12/18/2022 Creatinine 1.13, BUN 36, Potassium 4.5, Sodium 135  11/24/2022 Creatinine 1.45, BUN 45, Potassium 3.2, Sodium 135, GFR 53 (12:09 PM) 11/24/2022 Creatinine 1.59, BUN 51, Potassium 4.0, Sodium 138, GFR 47 (6:01 AM) A complete set of results can be found in Results Review.   Recommendations:   No changes and encouraged to call if experiencing any fluid symptoms.   Follow-up plan: ICM clinic phone appointment on 04/08/2023.   91 day device clinic remote transmission 04/24/2023.   EP/Cardiology Office Visits:    Recall 11/28/2023 with Dr Graciela Husbands.  03/12/2023 with Dr Shirlee Latch.   Copy of ICM check sent to Dr. Graciela Husbands.   3 month ICM trend: 03/06/2023.    12-14 Month ICM trend:     Karie Soda, RN 03/06/2023 12:57 PM

## 2023-03-07 ENCOUNTER — Telehealth (HOSPITAL_COMMUNITY): Payer: Self-pay | Admitting: Emergency Medicine

## 2023-03-07 ENCOUNTER — Other Ambulatory Visit (HOSPITAL_COMMUNITY): Payer: Self-pay | Admitting: Emergency Medicine

## 2023-03-07 NOTE — Telephone Encounter (Signed)
Called and spoke to [name] to advise him that he should indeed be taking Torsemide 100mg  in the morning and 80mg  in the evening.  He acknowledges he understands same and will change to this regimen.    Beatrix Shipper, EMT-Paramedic [phone] 03/07/2023

## 2023-03-07 NOTE — Telephone Encounter (Signed)
Called HF triage and spoke to Chantel to clarify correct dosage of Torsemide for [name].  His med list says to take 100mg  a.m. and 80mg  p.m.   He states he has been taking 40mg . Torsemide BID.  Advised by Chantel the correct dose should be  100mg  in the morning and 80mg  in the evening  Will call pt to advise same.    Beatrix Shipper, EMT-Paramedic [phone] 03/07/2023

## 2023-03-07 NOTE — Progress Notes (Signed)
Paramedicine Encounter    Patient ID: [name], male    DOB: 1956-09-05, 66 y.o.   MRN: [id number]   Complaints NONE  Assessment A&O x 4, skin W&D w/ good color.  Lung sounds with ronchi in the upper lobes clear in the bases.  Compliance with meds YES  Pill box filled Does not use pill box prefers to take from bottles  Refills needed NONE  Meds changes since last visit NONE    Social changes NONE   BP 104/70 (BP Location: Left Arm, Patient Position: Sitting, Cuff Size: Normal)   Pulse 72   Resp 16   Wt 269 lb (122 kg)   SpO2 95%   BMI 38.60 kg/m  Weight yesterday-not taken Last visit weight-268lb  This is my first visit in a month with [name].  He reports to be feeling better than he has been as he has been recovering from shingles and he looks well.  Medications reviewed today- only discrepancy noted was that patient states he has been taking 40mg . Torsemide BID instead of the prescribed 100mg  in the morning and 80mg  in the evening.  I reconfirmed with HF Triage that he is to be taking the 100mg  & 80mg  a.m and p.m.  and advised [name] to make this change to his regimen.   Pt has no edema noted.  No complaints of chest pain.  SOB is baseline for him.  [facility] w/ Dr. Shirlee Latch and I advised I would see him there.  ACTION: Home visit completed  Bethanie Dicker [phone] 03/07/23  Patient Care Team: [name], MD as PCP - General (Internal Medicine) Laurey Morale, MD as PCP - Advanced Heart Failure (Cardiology) Duke Salvia, MD as PCP - Electrophysiology (Cardiology) Himmelrich, Loree Fee, RD (Inactive) as Dietitian Duke Salvia, MD as Consulting Physician (Cardiology) Kathryne Hitch, MD as Consulting Physician (Orthopedic Surgery) Levy Pupa, MD as Referring Physician (Ophthalmology)  Patient Active Problem List   Diagnosis Date Noted   Shingles 03/03/2023   Epigastric pain  01/16/2023   Nausea and vomiting 01/16/2023   Cardiac arrhythmia 01/16/2023   Cellulitis and abscess of leg, except foot 11/21/2022   Cellulitis of right lower extremity without foot 11/20/2022   Anxiety and depression 05/20/2022   AC (acromioclavicular) arthritis 04/24/2022   Cataract, nuclear sclerotic, left eye 03/07/2022   ED (erectile dysfunction) 01/03/2022   Intertrigo 12/24/2021   Gluteal tendinitis of left buttock 12/19/2021   Unilateral primary osteoarthritis, left knee 10/17/2021   Status post total knee replacement, left 10/17/2021   Rib pain on right side 10/10/2021   History of colonic polyps 09/26/2021   Bursitis of left hip 06/13/2021   Tooth fracture 06/11/2021   Pain and swelling of right lower leg 05/18/2021   Gastrointestinal hemorrhage    Diverticulitis of colon    Preop exam for internal medicine 03/04/2021   B12 deficiency 03/04/2021   Vitamin D deficiency 03/01/2021   Myalgia 12/01/2020   Laceration of left leg 09/17/2020   Statin myopathy 08/02/2020   Rotator cuff arthropathy of both shoulders 02/23/2020   (HFpEF) heart failure with preserved ejection fraction (HCC) 02/22/2020   ICD (implantable cardioverter-defibrillator) in place 12/27/2019   Elevated PSA 09/23/2019   Lip lesion 12/23/2018   Diabetic ulcer of left lower leg with necrosis of muscle (HCC) 12/05/2018   Chronic bursitis of left shoulder 07/01/2018   Pain due to total knee  replacement (HCC) 03/31/2018   Rotator cuff arthropathy, left 02/13/2018   Right rotator cuff tear arthropathy 01/14/2018   Increased prostate specific antigen (PSA) velocity 09/26/2017   HFrEF (heart failure with reduced ejection fraction) (HCC) 09/18/2017   Trigger point of thoracic region 07/04/2017   Right lumbar radiculopathy 05/15/2017   Right leg swelling 05/09/2017   Glaucoma 04/18/2017   History of nasal polyposis 02/19/2017   Chronic rhinitis 02/19/2017   Microhematuria 12/04/2016   Hyperlipidemia  08/15/2016   Encounter for well adult exam with abnormal findings 11/29/2015   Syncope 03/30/2015   Former tobacco use 02/25/2015   Type 2 diabetes mellitus (HCC) 02/25/2015   Permanent atrial fibrillation (HCC) 02/25/2015   Anemia 02/23/2015   Thyrotoxicosis 02/23/2015   Hx of adenomatous colonic polyps 10/21/2014   De Quervain's tenosynovitis, right 04/30/2014   Osteoarthritis of right knee 02/19/2014   Encounter for therapeutic drug monitoring 11/17/2013   Thoracic degenerative disc disease 06/17/2013   Thoracic spondylosis without myelopathy 02/11/2013   Lumbosacral spondylosis without myelopathy 02/11/2013   Degenerative joint disease of knee, left 02/11/2013   COPD (chronic obstructive pulmonary disease) (HCC) 08/20/2012   Bundle branch block, right and extreme left anterior fascicular 05/05/2012   Nonischemic cardiomyopathy (HCC) 05/02/2012   Anticoagulated on warfarin 03/21/2012   Spongiotic dermatitis 09/26/2011   Past myocardial infarction 04/19/2011   UNSPECIFIED URETHRAL STRICTURE 12/26/2010   Chronic pain syndrome 05/18/2010   OSA (obstructive sleep apnea) 10/21/2007   NEPHROLITHIASIS, HX OF 10/21/2007   GERD (gastroesophageal reflux disease) 06/09/2007   Benign prostatic hyperplasia 06/09/2007   Morbid obesity (HCC) 06/03/2007   Depression 06/03/2007   Essential hypertension 06/03/2007   INSOMNIA 06/03/2007    Current Outpatient Medications:    acetaminophen (TYLENOL) 500 MG tablet, Take 500-1,000 mg by mouth every 6 (six) hours as needed for mild pain or headache., Disp: , Rfl:    albuterol (VENTOLIN HFA) 108 (90 Base) MCG/ACT inhaler, Inhale 1-2 puffs into the lungs every 6 (six) hours as needed for wheezing or shortness of breath., Disp: 18 g, Rfl: 5   ALPRAZolam (XANAX) 0.5 MG tablet, TAKE 2 TABLETS (1 MG TOTAL) BY MOUTH EVERY DAY AT BEDTIME, Disp: 60 tablet, Rfl: 5   ammonium lactate (AMLACTIN) 12 % cream, Apply 1 Application topically as needed for dry skin.,  Disp: 385 g, Rfl: 3   apixaban (ELIQUIS) 5 MG TABS tablet, Take 1 tablet (5 mg total) by mouth 2 (two) times daily., Disp: 60 tablet, Rfl: 11   augmented betamethasone dipropionate (DIPROLENE-AF) 0.05 % cream, Apply 1 application  topically daily as needed (to affected areas- for itching)., Disp: , Rfl:    carisoprodol (SOMA) 350 MG tablet, TAKE 1 TABLET BY MOUTH THREE TIMES A DAY AS NEEDED FOR MUSCLE SPASM, Disp: 90 tablet, Rfl: 2   carvedilol (COREG) 6.25 MG tablet, TAKE 1 TABLET BY MOUTH TWICE A DAY, Disp: 180 tablet, Rfl: 3   Cholecalciferol (VITAMIN D3) 1000 units CAPS, Take 1,000 Units by mouth daily., Disp: , Rfl:    doxepin (SINEQUAN) 10 MG capsule, TAKE 2 CAPSULES BY MOUTH AT BEDTIME AS NEEDED., Disp: 180 capsule, Rfl: 1   DULoxetine (CYMBALTA) 60 MG capsule, Take 60 mg by mouth daily., Disp: , Rfl:    Dupilumab (DUPIXENT) 300 MG/2ML SOPN, Inject 300 mg into the skin every 14 (fourteen) days., Disp: , Rfl:    eplerenone (INSPRA) 25 MG tablet, Take 1 tablet (25 mg total) by mouth daily., Disp: 30 tablet, Rfl: 8   Evolocumab (  REPATHA SURECLICK) 140 MG/ML SOAJ, Inject 140 mg into the skin every 14 (fourteen) days., Disp: 6 mL, Rfl: 3   fluticasone (FLONASE) 50 MCG/ACT nasal spray, PLACE 2 SPRAYS INTO BOTH NOSTRILS DAILY AS NEEDED FOR ALLERGIES OR RHINITIS., Disp: 48 mL, Rfl: 1   fluticasone-salmeterol (WIXELA INHUB) 250-50 MCG/ACT AEPB, Inhale 1 puff into the lungs in the morning and at bedtime., Disp: 60 each, Rfl: 11   hydrOXYzine (ATARAX) 10 MG tablet, TAKE 1 TABLET BY MOUTH 3 TIMES DAILY AS NEEDED FOR ITCHING. Strength: 10 mg, Disp: 60 tablet, Rfl: 2   ipratropium-albuterol (DUONEB) 0.5-2.5 (3) MG/3ML SOLN, TAKE 3 ML BY NEBULIZATION EVERY 4 HOURS AS NEEDED (WHEEZING)., Disp: 360 mL, Rfl: 0   ketoconazole (NIZORAL) 2 % cream, Apply 1 Application topically daily., Disp: 120 g, Rfl: 1   KLOR-CON M20 20 MEQ tablet, TAKE 1 TABLET BY MOUTH EVERY DAY (Patient taking differently: Take 40 mEq by  mouth daily.), Disp: 90 tablet, Rfl: 3   losartan (COZAAR) 25 MG tablet, Take 1 tablet (25 mg total) by mouth daily., Disp: 90 tablet, Rfl: 3   omeprazole (PRILOSEC) 20 MG capsule, TAKE 1 CAPSULE BY MOUTH TWICE A DAY, Disp: 180 capsule, Rfl: 1   predniSONE (DELTASONE) 10 MG tablet, Take 1 tablet (10 mg total) by mouth daily with breakfast., Disp: 30 tablet, Rfl: 0   promethazine (PHENERGAN) 25 MG tablet, Take 1 tablet (25 mg total) by mouth every 8 (eight) hours as needed for nausea or vomiting., Disp: 30 tablet, Rfl: 0   Respiratory Therapy Supplies (FULL KIT NEBULIZER SET) MISC, 1 each by Does not apply route as needed., Disp: 1 each, Rfl: 0   silver sulfADIAZINE (SILVADENE) 1 % cream, APPLY TOPICALLY TO AFFECTED AREA EVERY DAY, Disp: 50 g, Rfl: 3   tamsulosin (FLOMAX) 0.4 MG CAPS capsule, TAKE 1 CAPSULE BY MOUTH EVERY DAY (Patient taking differently: Take 0.4 mg by mouth every evening.), Disp: 90 capsule, Rfl: 3   traMADol (ULTRAM) 50 MG tablet, TAKE 1 TABLET BY MOUTH EVERY DAY AS NEEDED, Disp: 30 tablet, Rfl: 2   traZODone (DESYREL) 100 MG tablet, TAKE 2 TABLETS BY MOUTH AT BEDTIME AS NEEDED, Disp: 180 tablet, Rfl: 1   TUMS 500 MG chewable tablet, Chew 500-2,000 mg by mouth every 4 (four) hours as needed for indigestion or heartburn., Disp: , Rfl:    venlafaxine XR (EFFEXOR XR) 150 MG 24 hr capsule, Take 1 capsule (150 mg total) by mouth daily with breakfast., Disp: 90 capsule, Rfl: 3   zolpidem (AMBIEN) 10 MG tablet, TAKE 1 TABLET BY MOUTH EVERY DAY AT BEDTIME AS NEEDED, Disp: 90 tablet, Rfl: 1   clotrimazole-betamethasone (LOTRISONE) cream, Apply 1 Application topically daily., Disp: 30 g, Rfl: 0   fluconazole (DIFLUCAN) 100 MG tablet, Take 1 tablet (100 mg total) by mouth daily., Disp: 7 tablet, Rfl: 0   ondansetron (ZOFRAN) 4 MG tablet, Take 1 tablet (4 mg total) by mouth every 6 (six) hours as needed for nausea. (Patient not taking: Reported on 03/07/2023), Disp: 40 tablet, Rfl: 0   torsemide  (DEMADEX) 20 MG tablet, Take 5 tablets (100 mg total) by mouth every morning AND 4 tablets (80 mg total) every evening., Disp: 810 tablet, Rfl: 3 Allergies  Allergen Reactions   Tape Other (See Comments)    SKIN TEARS VERY, VERY EASILY!!!! Use Paper Tape Only, PLEASE!!!   Amiodarone Other (See Comments)    Caused hyperthyroidism    Statins Other (See Comments)    Myalgias  Wound Dressing Adhesive Other (See Comments)    Skin Tears Use Paper Tape Only     Social History   Socioeconomic History   Marital status: Divorced    Spouse name: Not on file   Number of children: 2   Years of education: Not on file   Highest education level: Not on file  Occupational History   Occupation: retired/disabled. prev worked in Teacher, English as a foreign language.    Employer: DISABLED  Tobacco Use   Smoking status: Former    Packs/day: 2.00    Years: 30.00    Additional pack years: 0.00    Total pack years: 60.00    Types: Cigarettes    Quit date: 10/16/2007    Years since quitting: 15.4   Smokeless tobacco: Never  Vaping Use   Vaping Use: Never used  Substance and Sexual Activity   Alcohol use: No   Drug use: No   Sexual activity: Not Currently  Other Topics Concern   Not on file  Social History Narrative   Lives alone.   Social Determinants of Health   Financial Resource Strain: Low Risk  (06/29/2022)   Overall Financial Resource Strain (CARDIA)    Difficulty of Paying Living Expenses: Not hard at all  Food Insecurity: No Food Insecurity (11/26/2022)   Hunger Vital Sign    Worried About Running Out of Food in the Last Year: Never true    Ran Out of Food in the Last Year: Never true  Transportation Needs: No Transportation Needs (11/26/2022)   PRAPARE - Administrator, Civil Service (Medical): No    Lack of Transportation (Non-Medical): No  Physical Activity: Inactive (06/29/2022)   Exercise Vital Sign    Days of Exercise per Week: 0 days    Minutes of Exercise per Session: 0 min   Stress: No Stress Concern Present (06/29/2022)   [employer] of Occupational Health - Occupational Stress Questionnaire    Feeling of Stress : Not at all  Social Connections: Moderately Integrated (06/29/2022)   Social Connection and Isolation Panel [NHANES]    Frequency of Communication with Friends and Family: More than three times a week    Frequency of Social Gatherings with Friends and Family: More than three times a week    Attends Religious Services: More than 4 times per year    Active Member of Clubs or Organizations: Yes    Attends Banker Meetings: More than 4 times per year    Marital Status: Divorced  Intimate Partner Violence: Not At Risk (11/22/2022)   Humiliation, Afraid, Rape, and Kick questionnaire    Fear of Current or Ex-Partner: No    Emotionally Abused: No    Physically Abused: No    Sexually Abused: No    Physical Exam      Future Appointments  Date Time Provider Department Center  03/12/2023  1:40 PM Laurey Morale, MD [facility] None  03/29/2023  1:00 PM Judi Saa, DO [facility] None  04/08/2023  7:15 AM [facility] DEVICE REMOTES [facility]  04/24/2023  9:05 AM [facility] DEVICE REMOTES [facility]  05/02/2023  1:30 PM Luciano Cutter, MD [facility]  05/21/2023  1:45 PM Edwin Cap, DPM [facility]  07/01/2023  1:20 PM [name], MD [facility] None  07/24/2023  9:05 AM [facility] DEVICE REMOTES [facility]  10/23/2023  9:05 AM [facility] DEVICE REMOTES [facility]  01/22/2024  9:05 AM [facility] DEVICE REMOTES [facility]  04/22/2024  9:05 AM  [facility] DEVICE REMOTES [facility]

## 2023-03-07 NOTE — Progress Notes (Signed)
  Subjective:  Patient ID: [name], male    DOB: 02/19/1956,  MRN: [id number]  Chief Complaint  Patient presents with   Nail Problem    Thick painful toenails, 10 week follow up for at risk diabetic foot care     67 y.o. male returns with the above complaint. History confirmed with patient.  His nails are thickened elongated and causing pain again.  Calluses thickened some on both feet.  Prior wound has healed but he has a new wound on the right shin as well that is healing.   Objective:  Physical Exam: warm, good capillary refill, no trophic changes or ulcerative lesions, normal sensory exam, +2 DP and PT reduced bilateral and venous stasis dermatitis noted.  Moderate to severe hallux abductovalgus with hammertoe contractures 2 through 5 bilaterally, right worse than left.  Thickened elongated dystrophic mycotic nails x10 with pincer nail deformity.    Callus medial 1st MTPJ bilaterally. Xerosis cutis noted  Summary:  Right: Resting right ankle-brachial index indicates noncompressible right  lower extremity arteries. The right toe-brachial index is normal. ABIs are  unreliable.   Left: Resting left ankle-brachial index is within normal range. No  evidence of significant left lower extremity arterial disease. The left  toe-brachial index is normal.       *See table(s) above for measurements and observations.         Radiographs: X-ray of the right foot: Moderate hallux abductovalgus with the presence of metatarsus adductus deformity and pes planus, no increased change in alignment since last radiographs Assessment:   1. Pain due to onychomycosis of toenails of both feet   2. Callus of foot   3. Peripheral vascular disease (HCC)   4. Type 2 diabetes mellitus without complication, without long-term current use of insulin (HCC)        Plan:  Patient was evaluated and treated and all questions answered.  Discussed the etiology and treatment options for the condition  in detail with the patient. Educated patient on the topical and oral treatment options for mycotic nails. Recommended debridement of the nails today. Sharp and mechanical debridement performed of all painful and mycotic nails today. Nails debrided in length and thickness using a nail nipper to level of comfort. Discussed treatment options including appropriate shoe gear. Follow up as needed for painful nails.  Ingrowing corners of hallux nails debrided and slant back fashion with lidocaine cream  All symptomatic hyperkeratoses were safely debrided with a sterile #15 blade to patient's level of comfort without incident. We discussed preventative and palliative care of these lesions including supportive and accommodative shoegear, padding, prefabricated and custom molded accommodative orthoses, use of a pumice stone and lotions/creams daily.  Ammonium lactate cream rx sent to pharmacy  for dry skin   No follow-ups on file.

## 2023-03-10 ENCOUNTER — Other Ambulatory Visit: Payer: Self-pay | Admitting: Internal Medicine

## 2023-03-12 ENCOUNTER — Encounter (HOSPITAL_COMMUNITY): Payer: Self-pay | Admitting: Cardiology

## 2023-03-12 ENCOUNTER — Ambulatory Visit (HOSPITAL_COMMUNITY)
Admission: RE | Admit: 2023-03-12 | Discharge: 2023-03-12 | Disposition: A | Discharge status: Home / Self Care | Payer: Medicare HMO | Source: Ambulatory Visit | Attending: Cardiology | Admitting: Cardiology

## 2023-03-12 ENCOUNTER — Other Ambulatory Visit (HOSPITAL_COMMUNITY): Payer: Self-pay | Admitting: Emergency Medicine

## 2023-03-12 VITALS — BP 110/70 | HR 74 | Wt 273.2 lb

## 2023-03-12 DIAGNOSIS — I428 Other cardiomyopathies: Secondary | ICD-10-CM | POA: Insufficient documentation

## 2023-03-12 DIAGNOSIS — I110 Hypertensive heart disease with heart failure: Secondary | ICD-10-CM | POA: Diagnosis not present

## 2023-03-12 DIAGNOSIS — Z7901 Long term (current) use of anticoagulants: Secondary | ICD-10-CM | POA: Diagnosis not present

## 2023-03-12 DIAGNOSIS — R55 Syncope and collapse: Secondary | ICD-10-CM | POA: Diagnosis not present

## 2023-03-12 DIAGNOSIS — I442 Atrioventricular block, complete: Secondary | ICD-10-CM | POA: Diagnosis not present

## 2023-03-12 DIAGNOSIS — Z79899 Other long term (current) drug therapy: Secondary | ICD-10-CM | POA: Diagnosis not present

## 2023-03-12 DIAGNOSIS — W19XXXA Unspecified fall, initial encounter: Secondary | ICD-10-CM | POA: Insufficient documentation

## 2023-03-12 DIAGNOSIS — I959 Hypotension, unspecified: Secondary | ICD-10-CM | POA: Insufficient documentation

## 2023-03-12 DIAGNOSIS — I2510 Atherosclerotic heart disease of native coronary artery without angina pectoris: Secondary | ICD-10-CM | POA: Insufficient documentation

## 2023-03-12 DIAGNOSIS — J4489 Other specified chronic obstructive pulmonary disease: Secondary | ICD-10-CM | POA: Insufficient documentation

## 2023-03-12 DIAGNOSIS — N179 Acute kidney failure, unspecified: Secondary | ICD-10-CM | POA: Diagnosis not present

## 2023-03-12 DIAGNOSIS — G4733 Obstructive sleep apnea (adult) (pediatric): Secondary | ICD-10-CM | POA: Insufficient documentation

## 2023-03-12 DIAGNOSIS — I5022 Chronic systolic (congestive) heart failure: Secondary | ICD-10-CM | POA: Insufficient documentation

## 2023-03-12 DIAGNOSIS — Z9981 Dependence on supplemental oxygen: Secondary | ICD-10-CM | POA: Insufficient documentation

## 2023-03-12 DIAGNOSIS — Z87891 Personal history of nicotine dependence: Secondary | ICD-10-CM | POA: Diagnosis not present

## 2023-03-12 DIAGNOSIS — I4821 Permanent atrial fibrillation: Secondary | ICD-10-CM | POA: Diagnosis not present

## 2023-03-12 NOTE — Progress Notes (Unsigned)
Paramedicine Encounter   Patient ID: [name] , male,   DOB: Oct 27, 1955,67 y.o.,  MRN: [id number]   Met patient in clinic today with provider.  Time spent with patient 35 minutes  Met w/ pt and provider - Dr. Zannie Kehr, EMT-Paramedic  [phone] 03/12/2023

## 2023-03-12 NOTE — Progress Notes (Signed)
ID:  [name], DOB Nov 20, 1955, MRN [id number]  Provider location: [facility] Type of Visit: Established patient  PCP:  [name], MD  Cardiologist:  Dr. Shirlee Latch   History of Present Illness: [name] is a 67 y.o. male who has history of COPD on home oxygen, permanent atrial fibrillation, and chronic systolic CHF.  Amiodarone and DCCV were tried for atrial fibrillation in the past without success.   Patient has been noted to have a low EF since 2013.  EF improved to normal by 2015 but dropped to 30-35% by 11/16.  Cath in 6/17 showed some CAD but not enough to cause cardiomyopathy.  He was admitted in 3/18 with PNA, atrial fibrillation/RVR, and acute on chronic systolic CHF.  Echo in 3/18 showed fall in EF to 10-15%.   Underwent right and left heart cath in 4/18, which showed nonobstructive CAD and relatively optimized filling pressures.    Syncope in 11/18. Loop recorder showed 6 second pause and periods of complete heart block. He had Medtronic BiV ICD placed in 12/18.  In 1/19, he had AV nodal ablation to promote BiV pacing.   Echo was done 6/19 with EF 25-30% with moderate RV dilation/mildly decreased systolic function. CPX 7/19 was submaximal, probably mild functional impairment.    Echo in 1/21 showed EF 35-40% with diffuse hypokinesis, moderate RV dilation with mildly decreased systolic function.   He was admitted with COVID-19 PNA in 2/19.  He had AKI/hypotension and Entresto was stopped.   Echo in 3/22 showed EF 40% with diffuse hypokinesis, mild RV enlargement, mildly decreased RV systolic function.   Presented to [facility] 7/22 with weakness and dizziness. Found to be in hemorrhagic shock from acute GIB. Hgb 5.0, INR 8.4. He was on Coumadin for atrial fibrillation. He was resuscitated with PRBCs, IVF, and given Vitamin K. His Entresto, beta blocker and torsemide were held due to hypotension. He initially required ICU monitoring due to hypotension  requiring pressors. GI team was consulted and planned to treat with oral antibiotics for suspected diverticular bleed with plans for outpatient colonoscopy.  Coumadin was transitioned to Eliquis.  He was discharged back on beta blocker, Entresto and SGLT2i. Dig, spiro and torsemide were held at discharge. Weight 259 lbs.  S/p TKR 1/23  Follow up 12/23, NYHA II-early III, volume overloaded and torsemide increased to 80 bid.   Echo 1/24 EF 40%, RV normal, no significant change from prior.  Admitted 2/24 w/ RLE cellulitis, failed outpatient abx. Received IV eventually transitioned to oral cefadroxil. Followed by ID, saw [name] 12/18/22 and arranged for CT of RLE to rule out deep tissue abscess. Finished abx course.  Today he returns for HF follow up. He is stable symptomatically.  Weight down 1 lb.  He walks with a cane, chronic dyspnea after walking 100-200 feet.  Main complaint is arthritis with right shoulder and bilateral hip pain. He chronically sleeps in a recliner.  No chest pain.  No lightheadedness.  He has been unable to use CPAP.  He is no longer using home oxygen. He is currently on prednisone for arthritis.   MDT device interrogation (personally reviewed): 99.8% BiV pacing, stable thoracic impedance, no VT.   ECG (personally reviewed): Atrial fibrillation with BiV pacing  Labs (1/19): K 4.4, creatinine 0.75, pro-BNP 433 Labs (2/19): digoxin 0.4 Labs (3/19): LDL 121, hgb 12.8, K 4.5, creatinine 0.98 Labs (6/19): LDL 48, HDL 45, K 5, creatinine 0.94, digoxin 0.3 Labs (9/19): K  3.7, creatinine 1.09 => 1.32, digoxin 0.2 Labs (11/19): LDL 115 Labs (12/20): K 3.9, creatinine 0.87, LDL 94  Labs (3/21): K 4, creatinine 1.08 Labs (6/21): LDL 109 Labs (7/21): K 3.7, creatinine 1.15 Labs (12/21): LDL 129, TGs 239, hgb 12.9, K 3.9, creatinine 0.98, digoxin 0.8 Labs (1/22): ALT 76, AST 40, LDL 162, K 3.8 =>4.6, creatinine 1.16 => 1.12, digoxin 0.7, repeat LFTs normal Labs (5/22): LDL 72,  TGs 335, K 4.2, creatinine 1.29 Labs (7/22): K 4.0, creatinine 0.77 Labs (8/22): K 4.0, creatinine 1.11 Labs (9/22): K 3.7, creatinine 1.14, hgb 11.7 Labs (3/23): LDL 28, TGs 132, LFTs normal, K 4.3, creatinine 1.0 Labs (6/23): K 4.4, creatinine 1.37 Labs (2/24): K 3.2, creatinine 1.45 Labs (3/24): K 4.4, creatinine 1.13, LDL 79 Labs (4/24): K 4.6, creatinine 1.38  Labs (5/24): K 4.4, creatinine 1.2  PMH: 1. Asthma 2. COPD: On home oxygen. Prior smoker.  3. Atrial fibrillation: Permanent.  He failed DCCV and amiodarone in the past . 4. HTN 5. GERD 6. Hyperlipidemia: Myalgias with atorvastatin 7. BPH.  8. Chronic systolic CHF: Nonischemic cardiomyopathy.  - EF 25% by TEE in 7/13.  Normalized on 2015 echo. - Echo (11/16): EF 30-35%.  - LHC/RHC (6/17): 80% ostial stenosis small D1, 30-40% pLAD.  Mean RA 4, PA 26/10, mean PCWP 11, CI 2.33.  - Echo (3/18): EF 10-15%, diffuse hypokinesis, severe LV dilation, moderate RV dilation with severely decreased systolic function.  - LHC/RHC (4/18): 40% proximal LAD.  Mean RA 8, PA 37/10 mean 22, mean PCWP 22, LVEDP 9, CI 2.39. - Medtronic BiV ICD placement in 12/18 with AV nodal ablation to promote BiV pacing in 1/19.  - Echo (6/19): EF 25-30% with mild LV dilation, moderately dilated RV with mildly decreased systolic function, IVC not dilated, PASP 23 mmHg.  - CPX (7/19): peak VO2 17.6 (78% predicted), VE/VCO2 slope 31, RER 1.02 => submaximal, probably mild functional impairment.  - Echo (1/21): EF 35-40% with diffuse hypokinesis, moderate RV dilation with mildly decreased systolic function. - Echo (3/22): EF 40% with diffuse hypokinesis, mild RV enlargement, mildly decreased RV systolic function.  - Echo (1/24): EF 40%, RV normal 9. Morbid obesity 10. ILR in place.  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 Complete heart block: s/p Medtronic CRT-D.  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 OSA: Untreated, unable to tolerate CPAP.  15. COVID-19 PNA 2/21.   16. Hyperlipidemia: Myalgias with multiple statins.  17. PAD: ABIs 10/21 noncompressible on right but TBI normal, normal ABI and TBI on left.  18. GI bleeding: 7/22, diverticulosis.  19. Left TKR (1/23)  Social History   Socioeconomic History   Marital status: Divorced    Spouse name: Not on file   Number of children: 2   Years of education: Not on file   Highest education level: Not on file  Occupational History   Occupation: retired/disabled. prev worked in Teacher, English as a foreign language.    Employer: DISABLED  Tobacco Use   Smoking status: Former    Packs/day: 2.00    Years: 30.00    Additional pack years: 0.00    Total pack years: 60.00    Types: Cigarettes    Quit date: 10/16/2007    Years since quitting: 15.4   Smokeless tobacco: Never  Vaping Use   Vaping Use: Never used  Substance and Sexual Activity   Alcohol use: No   Drug use: No   Sexual activity: Not Currently  Other Topics Concern   Not on file  Social History  Narrative   Lives alone.   Social Determinants of Health   Financial Resource Strain: Low Risk  (06/29/2022)   Overall Financial Resource Strain (CARDIA)    Difficulty of Paying Living Expenses: Not hard at all  Food Insecurity: No Food Insecurity (11/26/2022)   Hunger Vital Sign    Worried About Running Out of Food in the Last Year: Never true    Ran Out of Food in the Last Year: Never true  Transportation Needs: No Transportation Needs (11/26/2022)   PRAPARE - Administrator, Civil Service (Medical): No    Lack of Transportation (Non-Medical): No  Physical Activity: Inactive (06/29/2022)   Exercise Vital Sign    Days of Exercise per Week: 0 days    Minutes of Exercise per Session: 0 min  Stress: No Stress Concern Present (06/29/2022)   [employer] of Occupational Health - Occupational Stress Questionnaire    Feeling of Stress : Not at all  Social Connections: Moderately Integrated (06/29/2022)   Social Connection and Isolation Panel  [NHANES]    Frequency of Communication with Friends and Family: More than three times a week    Frequency of Social Gatherings with Friends and Family: More than three times a week    Attends Religious Services: More than 4 times per year    Active Member of [employer] or Organizations: Yes    Attends Engineer, structural: More than 4 times per year    Marital Status: Divorced  Intimate Partner Violence: Not At Risk (11/22/2022)   Humiliation, Afraid, Rape, and Kick questionnaire    Fear of Current or Ex-Partner: No    Emotionally Abused: No    Physically Abused: No    Sexually Abused: No   Family History  Problem Relation Age of Onset   COPD Mother    Asthma Mother    Colon polyps Mother    Allergies Mother    Hypothyroidism Mother    Asthma Maternal Grandmother    Colon cancer Neg Hx    ROS: All systems reviewed and negative except as per HPI.   Current Outpatient Medications  Medication Sig Dispense Refill   acetaminophen (TYLENOL) 500 MG tablet Take 500-1,000 mg by mouth every 6 (six) hours as needed for mild pain or headache.     albuterol (VENTOLIN HFA) 108 (90 Base) MCG/ACT inhaler Inhale 1-2 puffs into the lungs every 6 (six) hours as needed for wheezing or shortness of breath. 18 g 5   ALPRAZolam (XANAX) 0.5 MG tablet TAKE 2 TABLETS (1 MG TOTAL) BY MOUTH EVERY DAY AT BEDTIME 60 tablet 5   ammonium lactate (AMLACTIN) 12 % cream Apply 1 Application topically as needed for dry skin. 385 g 3   apixaban (ELIQUIS) 5 MG TABS tablet Take 1 tablet (5 mg total) by mouth 2 (two) times daily. 60 tablet 11   augmented betamethasone dipropionate (DIPROLENE-AF) 0.05 % cream Apply 1 application  topically daily as needed (to affected areas- for itching).     carisoprodol (SOMA) 350 MG tablet TAKE 1 TABLET BY MOUTH THREE TIMES A DAY AS NEEDED FOR MUSCLE SPASM 90 tablet 2   carvedilol (COREG) 6.25 MG tablet TAKE 1 TABLET BY MOUTH TWICE A DAY 180 tablet 3   Cholecalciferol (VITAMIN D3)  1000 units CAPS Take 1,000 Units by mouth daily.     clotrimazole-betamethasone (LOTRISONE) cream Apply 1 Application topically daily. 30 g 0   doxepin (SINEQUAN) 10 MG capsule TAKE 2 CAPSULES BY MOUTH AT BEDTIME  AS NEEDED. 180 capsule 1   DULoxetine (CYMBALTA) 60 MG capsule Take 60 mg by mouth daily.     Dupilumab (DUPIXENT) 300 MG/2ML SOPN Inject 300 mg into the skin every 14 (fourteen) days.     eplerenone (INSPRA) 25 MG tablet Take 1 tablet (25 mg total) by mouth daily. 30 tablet 8   Evolocumab (REPATHA SURECLICK) 140 MG/ML SOAJ Inject 140 mg into the skin every 14 (fourteen) days. 6 mL 3   fluticasone (FLONASE) 50 MCG/ACT nasal spray PLACE 2 SPRAYS INTO BOTH NOSTRILS DAILY AS NEEDED FOR ALLERGIES OR RHINITIS. 48 mL 1   hydrOXYzine (ATARAX) 10 MG tablet TAKE 1 TABLET BY MOUTH 3 TIMES DAILY AS NEEDED FOR ITCHING. Strength: 10 mg 60 tablet 2   ipratropium-albuterol (DUONEB) 0.5-2.5 (3) MG/3ML SOLN TAKE 3 ML BY NEBULIZATION EVERY 4 HOURS AS NEEDED (WHEEZING). 360 mL 0   ketoconazole (NIZORAL) 2 % cream Apply 1 Application topically daily. 120 g 1   KLOR-CON M20 20 MEQ tablet TAKE 1 TABLET BY MOUTH EVERY DAY 90 tablet 3   losartan (COZAAR) 25 MG tablet Take 1 tablet (25 mg total) by mouth daily. 90 tablet 3   omeprazole (PRILOSEC) 20 MG capsule TAKE 1 CAPSULE BY MOUTH TWICE A DAY 180 capsule 1   ondansetron (ZOFRAN) 4 MG tablet Take 1 tablet (4 mg total) by mouth every 6 (six) hours as needed for nausea. 40 tablet 0   predniSONE (DELTASONE) 10 MG tablet Take 1 tablet (10 mg total) by mouth daily with breakfast. 30 tablet 0   promethazine (PHENERGAN) 25 MG tablet Take 1 tablet (25 mg total) by mouth every 8 (eight) hours as needed for nausea or vomiting. 30 tablet 0   Respiratory Therapy Supplies (FULL KIT NEBULIZER SET) MISC 1 each by Does not apply route as needed. 1 each 0   silver sulfADIAZINE (SILVADENE) 1 % cream APPLY TOPICALLY TO AFFECTED AREA EVERY DAY 50 g 3   tamsulosin (FLOMAX) 0.4 MG  CAPS capsule TAKE 1 CAPSULE BY MOUTH EVERY DAY 90 capsule 3   torsemide (DEMADEX) 20 MG tablet Take 60 mg by mouth 2 (two) times daily.     traMADol (ULTRAM) 50 MG tablet TAKE 1 TABLET BY MOUTH EVERY DAY AS NEEDED 30 tablet 2   traZODone (DESYREL) 100 MG tablet TAKE 2 TABLETS BY MOUTH AT BEDTIME AS NEEDED 180 tablet 1   TUMS 500 MG chewable tablet Chew 500-2,000 mg by mouth every 4 (four) hours as needed for indigestion or heartburn.     venlafaxine XR (EFFEXOR XR) 150 MG 24 hr capsule Take 1 capsule (150 mg total) by mouth daily with breakfast. 90 capsule 3   zolpidem (AMBIEN) 10 MG tablet TAKE 1 TABLET BY MOUTH EVERY DAY AT BEDTIME AS NEEDED 90 tablet 1   fluconazole (DIFLUCAN) 100 MG tablet Take 1 tablet (100 mg total) by mouth daily. 7 tablet 0   fluticasone-salmeterol (WIXELA INHUB) 250-50 MCG/ACT AEPB Inhale 1 puff into the lungs in the morning and at bedtime. 60 each 11   No current facility-administered medications for this encounter.   Wt Readings from Last 3 Encounters:  03/12/23 121.7 kg (268 lb 3.2 oz)  03/12/23 123.9 kg (273 lb 3.2 oz)  03/07/23 122 kg (269 lb)   BP 110/70   Pulse 74   Wt 123.9 kg (273 lb 3.2 oz)   SpO2 95%   BMI 39.20 kg/m   PHYSICAL EXAM: General: NAD Neck: No JVD, no thyromegaly or thyroid nodule.  Lungs: Clear to auscultation bilaterally with normal respiratory effort. CV: Nondisplaced PMI.  Heart regular S1/S2, no S3/S4, no murmur.  1+ ankle edema.  No carotid bruit.  Normal pedal pulses.  Abdomen: Soft, nontender, no hepatosplenomegaly, no distention.  Skin: Intact without lesions or rashes.  Neurologic: Alert and oriented x 3.  Psych: Normal affect. Extremities: No clubbing or cyanosis.  HEENT: Normal.   Assessment/Plan:  1. Atrial fibrillation: Permanent.  Now s/p AV nodal ablation to allow effective BiV pacing.  - Continue Eliquis 5 mg bid.  2. COPD: No longer smoking.  Not using oxygen.  - followed by Dr. Everardo All  3. Chronic systolic  CHF: Nonischemic cardiomyopathy based on 6/17 cath. However, he had a recent significant fall in EF from 30-35% to 10-15% on echo from 3/18.  LHC/RHC in 4/18 showed nonobstructive CAD and relatively optimized filling pressures.  He now has Medtronic CRT-D device with AV nodal ablation to allow BiV pacing.  Echo 6/19 with EF 25-30% with mildly decreased RV systolic function. CPX 7/19 was submaximal but likely only mild functional limitation from CHF.  Echo in 1/21 showed EF 35-40%, echo in 3/22 and again in 1/24 showed EF up to 40% with mildly decreased RV systolic function.  Probably NYHA class II, confounded by body habitus and physical deconditioning. He is not volume overloaded by Optivol or exam.  - Continue torsemide 60 mg bid.  - Continue losartan 25 mg daily. Did not tolerate Entresto due to AKI and hypotension  - Continue Inspra 25 mg daily  - Continue Coreg 6.25 mg bid - Will restart Farxiga 10 mg daily with BMET in 10 days.  4. CAD: Nonobstructive on 4/18 cath.  No exertional chest pain.  - No ASA with Eliquis use. - Statin myalgias, continue Repatha. Good LDL in 3/24 (LDL 79).  5. Complete heart block: Now has Medtronic CRT-D device and s/p AV nodal ablation.  6. OSA: Unable to tolerate CPAP.    Followup with APP in 3 months.   Signed, Marca Ancona, MD  03/12/2023  [facility] [address] [facility] [place] [address] [phone] (office) [phone] (fax)

## 2023-03-12 NOTE — Patient Instructions (Signed)
RESTART Farxiga 10 mg daily.  Blood work in 10 days.  You have been referred to the [facility] for Semaglutide. They will call you to arrange appointment and let you know if you are eligible for it.  Your physician recommends that you schedule a follow-up appointment in: 3 months  If you have any questions or concerns before your next appointment please send [place] a message through [place] or call our office at [phone].    TO LEAVE A MESSAGE FOR THE NURSE SELECT OPTION 2, PLEASE LEAVE A MESSAGE INCLUDING: YOUR NAME DATE OF BIRTH CALL BACK NUMBER REASON FOR CALL**this is important as we prioritize the call backs  YOU WILL RECEIVE A CALL BACK THE SAME DAY AS LONG AS YOU CALL BEFORE 4:00 PM  At the [facility], you and your health needs are our priority. As part of our continuing mission to provide you with exceptional heart care, we have created designated Provider Care Teams. These Care Teams include your primary Cardiologist (physician) and Advanced Practice Providers (APPs- Physician Assistants and Nurse Practitioners) who all work together to provide you with the care you need, when you need it.   You may see any of the following providers on your designated Care Team at your next follow up: Dr Arvilla Meres Dr Marca Ancona Dr. Marcos Eke, NP Robbie Lis, [place] [facility] [place], [place] Brynda Peon, NP Karle Plumber, PharmD   Please be sure to bring in all your medications bottles to every appointment.    Thank you for choosing [facility]

## 2023-03-13 ENCOUNTER — Other Ambulatory Visit: Payer: Self-pay | Admitting: Internal Medicine

## 2023-03-16 ENCOUNTER — Other Ambulatory Visit: Payer: Self-pay | Admitting: Internal Medicine

## 2023-03-18 NOTE — Progress Notes (Signed)
Office Visit    Patient Name: [name] Date of Encounter: 03/21/2023  Primary Care Provider:  [name], MD Primary Cardiologist:  Marca Ancona  Chief Complaint    Weight management  Significant Past Medical History   CAD Non-obstructive by cath 2018  CHF Chronic systolic CHF EF at 40%, previously as low as 10-15%; on losartan, carvedilol, torsemide (unable for full GDMT 2/2 hypotension)  AF CHADS2-VASc = 4  HTN Controlled with CHF medications  DM2 A1c 2013 at 6.6, high of 8 that year, now improved with lifestyle modifications    Allergies  Allergen Reactions   Tape Other (See Comments)    SKIN TEARS VERY, VERY EASILY!!!! Use Paper Tape Only, PLEASE!!!   Amiodarone Other (See Comments)    Caused hyperthyroidism    Statins Other (See Comments)    Myalgias    Wound Dressing Adhesive Other (See Comments)    Skin Tears Use Paper Tape Only    History of Present Illness    [name] is a 67 y.o. male patient of Dr Shirlee Latch, in the office today to discuss options for weight loss management.    He was diagnosed with DM2 back in 2013.  Over time he noted that he was able to lose 93 pounds, but has gained mor than 50 pounds back.  Unfortunately he is now unable to get around like he used to    Current meds that may affect weight: prednisone 10 mg daily  Baseline weight/BMI:  120.9 kg // 38.25  Insurance payor:  [employer]   Diet: mix - occasional fast food; at home a variety of meats; admits to being emotional eater, often at night;  vegetables usually fresh at home; does snack at night regularly  Exercise: unable to move around   Family History: no cardiac histoyr on mother family, does not know father's  several 1/2 siblings on mom - no heart disease 2 kids healthy  Confirmed patient has no personal or family history of medullary thyroid carcinoma (MTC) or Multiple Endocrine Neoplasia syndrome type 2 (MEN 2).   Social History:   Tobacco:  no  Alcohol: no  Caffeine: not much, causes shakes  Adherence Assessment  Do you ever forget to take your medication? [] Yes [x] No  Do you ever skip doses due to side effects? [] Yes [x] No  Do you have trouble affording your medicines? [x] Yes had LIS previously,  now has income slightly above cut-off [] No  Are you ever unable to pick up your medication due to transportation difficulties? [] Yes [x] No  Do you ever stop taking your medications because you don't believe they are helping? [] Yes [] No   Adherence strategy: med bottles on desk   Accessory Clinical Findings    Lab Results  Component Value Date   CREATININE 1.20 02/18/2023   BUN 23 02/18/2023   NA 135 02/18/2023   K 4.4 02/18/2023   CL 107 02/18/2023   CO2 23 02/18/2023   Lab Results  Component Value Date   ALT 32 01/15/2023   AST 19 01/15/2023   ALKPHOS 55 01/15/2023   BILITOT 0.4 01/15/2023   Lab Results  Component Value Date   HGBA1C 5.8 12/31/2022    Home Medications/Allergies    Current Outpatient Medications  Medication Sig Dispense Refill   acetaminophen (TYLENOL) 500 MG tablet Take 500-1,000 mg by mouth every 6 (six) hours as needed for mild pain or headache.     albuterol (VENTOLIN HFA) 108 (90  Base) MCG/ACT inhaler Inhale 1-2 puffs into the lungs every 6 (six) hours as needed for wheezing or shortness of breath. 18 g 5   ALPRAZolam (XANAX) 0.5 MG tablet TAKE 2 TABLETS (1 MG TOTAL) BY MOUTH EVERY DAY AT BEDTIME 60 tablet 5   ammonium lactate (AMLACTIN) 12 % cream Apply 1 Application topically as needed for dry skin. 385 g 3   apixaban (ELIQUIS) 5 MG TABS tablet Take 1 tablet (5 mg total) by mouth 2 (two) times daily. 60 tablet 11   augmented betamethasone dipropionate (DIPROLENE-AF) 0.05 % cream Apply 1 application  topically daily as needed (to affected areas- for itching).     carisoprodol (SOMA) 350 MG tablet TAKE 1 TABLET BY MOUTH THREE TIMES A DAY AS NEEDED FOR MUSCLE SPASM 90 tablet 2    carvedilol (COREG) 6.25 MG tablet TAKE 1 TABLET BY MOUTH TWICE A DAY 180 tablet 3   Cholecalciferol (VITAMIN D3) 1000 units CAPS Take 1,000 Units by mouth daily.     clotrimazole-betamethasone (LOTRISONE) cream Apply 1 Application topically daily. 30 g 0   doxepin (SINEQUAN) 10 MG capsule TAKE 2 CAPSULES BY MOUTH AT BEDTIME AS NEEDED. 180 capsule 1   DULoxetine (CYMBALTA) 60 MG capsule Take 60 mg by mouth daily.     Dupilumab (DUPIXENT) 300 MG/2ML SOPN Inject 300 mg into the skin every 14 (fourteen) days.     eplerenone (INSPRA) 25 MG tablet Take 1 tablet (25 mg total) by mouth daily. 30 tablet 8   Evolocumab (REPATHA SURECLICK) 140 MG/ML SOAJ Inject 140 mg into the skin every 14 (fourteen) days. 6 mL 3   fluconazole (DIFLUCAN) 100 MG tablet Take 1 tablet (100 mg total) by mouth daily. 7 tablet 0   fluticasone (FLONASE) 50 MCG/ACT nasal spray PLACE 2 SPRAYS INTO BOTH NOSTRILS DAILY AS NEEDED FOR ALLERGIES OR RHINITIS. 48 mL 1   fluticasone-salmeterol (WIXELA INHUB) 250-50 MCG/ACT AEPB Inhale 1 puff into the lungs in the morning and at bedtime. 60 each 11   hydrOXYzine (ATARAX) 10 MG tablet TAKE 1 TABLET BY MOUTH 3 TIMES DAILY AS NEEDED FOR ITCHING. Strength: 10 mg 60 tablet 2   ipratropium-albuterol (DUONEB) 0.5-2.5 (3) MG/3ML SOLN TAKE 3 ML BY NEBULIZATION EVERY 4 HOURS AS NEEDED (WHEEZING). 360 mL 0   ketoconazole (NIZORAL) 2 % cream Apply 1 Application topically daily. 120 g 1   KLOR-CON M20 20 MEQ tablet TAKE 1 TABLET BY MOUTH EVERY DAY 90 tablet 3   losartan (COZAAR) 25 MG tablet Take 1 tablet (25 mg total) by mouth daily. 90 tablet 3   omeprazole (PRILOSEC) 20 MG capsule TAKE 1 CAPSULE BY MOUTH TWICE A DAY 180 capsule 1   ondansetron (ZOFRAN) 4 MG tablet Take 1 tablet (4 mg total) by mouth every 6 (six) hours as needed for nausea. 40 tablet 0   predniSONE (DELTASONE) 10 MG tablet Take 1 tablet (10 mg total) by mouth daily with breakfast. 30 tablet 0   promethazine (PHENERGAN) 25 MG tablet  Take 1 tablet (25 mg total) by mouth every 8 (eight) hours as needed for nausea or vomiting. 30 tablet 0   Respiratory Therapy Supplies (FULL KIT NEBULIZER SET) MISC 1 each by Does not apply route as needed. 1 each 0   silver sulfADIAZINE (SILVADENE) 1 % cream APPLY TOPICALLY TO AFFECTED AREA EVERY DAY 50 g 3   tamsulosin (FLOMAX) 0.4 MG CAPS capsule TAKE 1 CAPSULE BY MOUTH EVERY DAY 90 capsule 3   torsemide (DEMADEX) 20 MG  tablet Take 60 mg by mouth 2 (two) times daily.     traMADol (ULTRAM) 50 MG tablet TAKE 1 TABLET BY MOUTH EVERY DAY AS NEEDED 30 tablet 5   traZODone (DESYREL) 100 MG tablet TAKE 2 TABLETS BY MOUTH AT BEDTIME AS NEEDED 180 tablet 1   TUMS 500 MG chewable tablet Chew 500-2,000 mg by mouth every 4 (four) hours as needed for indigestion or heartburn.     venlafaxine XR (EFFEXOR XR) 150 MG 24 hr capsule Take 1 capsule (150 mg total) by mouth daily with breakfast. 90 capsule 3   zolpidem (AMBIEN) 10 MG tablet TAKE 1 TABLET BY MOUTH EVERY DAY AT BEDTIME AS NEEDED 90 tablet 1   No current facility-administered medications for this visit.     Allergies  Allergen Reactions   Tape Other (See Comments)    SKIN TEARS VERY, VERY EASILY!!!! Use Paper Tape Only, PLEASE!!!   Amiodarone Other (See Comments)    Caused hyperthyroidism    Statins Other (See Comments)    Myalgias    Wound Dressing Adhesive Other (See Comments)    Skin Tears Use Paper Tape Only    Assessment & Plan    Type 2 diabetes mellitus (HCC)  Patient has not met goal of at least 5% of body weight loss with comprehensive lifestyle modifications alone in the past 3-6 months. Pharmacotherapy is appropriate to pursue as augmentation. Will start Ozempic.   Confirmed patient has no personal or family history of medullary thyroid carcinoma (MTC) or Multiple Endocrine Neoplasia syndrome type 2 (MEN 2).   Advised patient on common side effects including nausea, diarrhea, dyspepsia, decreased appetite, and fatigue.  Counseled patient on reducing meal size and how to titrate medication to minimize side effects. Patient aware to call if intolerable side effects or if experiencing dehydration, abdominal pain, or dizziness. Patient will adhere to dietary modifications and will target at least 150 minutes of moderate intensity exercise weekly.   Injection technique reviewed at today's visit.    Titration Plan:  Will plan to follow the titration plan as below, pending patient is tolerating each dose before increasing to the next. Can slow titration if needed for tolerability.    -Month 1: Inject 0.25 mg SQ once weekly x 4 weeks -Month 2: Inject 0.5 mg SQ once weekly x 6 weeks -Month 3: Inject 1 mg SQ once weekly x 4 weeks -Month 4+: Inject 2 mg SQ once weekly   Follow up with patient in 3 months.   Phillips Hay PharmD CPP [facility]  [address] [place], [place] [address] [phone]

## 2023-03-19 ENCOUNTER — Ambulatory Visit: Payer: Medicare HMO | Attending: Cardiology | Admitting: Pharmacist Clinician (PhC)/ Clinical Pharmacy Specialist

## 2023-03-19 VITALS — Ht 70.0 in | Wt 266.6 lb

## 2023-03-19 DIAGNOSIS — Z7984 Long term (current) use of oral hypoglycemic drugs: Secondary | ICD-10-CM

## 2023-03-19 DIAGNOSIS — E1165 Type 2 diabetes mellitus with hyperglycemia: Secondary | ICD-10-CM

## 2023-03-19 NOTE — Patient Instructions (Signed)
  Patient has not met goal of at least 5% of body weight loss with comprehensive lifestyle modifications alone in the past 3-6 months. Pharmacotherapy is appropriate to pursue as augmentation. Will start Ozempic.   Confirmed patient has no personal or family history of medullary thyroid carcinoma (MTC) or Multiple Endocrine Neoplasia syndrome type 2 (MEN 2).   Advised patient on common side effects including nausea, diarrhea, dyspepsia, decreased appetite, and fatigue. Counseled patient on reducing meal size and how to titrate medication to minimize side effects. Patient aware to call if intolerable side effects or if experiencing dehydration, abdominal pain, or dizziness. Patient will adhere to dietary modifications and will target at least 150 minutes of moderate intensity exercise weekly.   Injection technique reviewed at today's visit.    Titration Plan:  Will plan to follow the titration plan as below, pending patient is tolerating each dose before increasing to the next. Can slow titration if needed for tolerability.    -Month 1: Inject 0.25 mg SQ once weekly x 4 weeks -Month 2: Inject 0.5 mg SQ once weekly x 6 weeks -Month 3: Inject 1 mg SQ once weekly x 4 weeks -Month 4+: Inject 2 mg SQ once weekly   Reach out with any questions or concerns - Phillips Hay PharmD

## 2023-03-21 ENCOUNTER — Telehealth (HOSPITAL_COMMUNITY): Payer: Self-pay

## 2023-03-21 ENCOUNTER — Encounter: Payer: Self-pay | Admitting: Pharmacist Clinician (PhC)/ Clinical Pharmacy Specialist

## 2023-03-21 ENCOUNTER — Telehealth (HOSPITAL_COMMUNITY): Payer: Self-pay | Admitting: Emergency Medicine

## 2023-03-21 MED ORDER — SEMAGLUTIDE(0.25 OR 0.5MG/DOS) 2 MG/3ML ~~LOC~~ SOPN: PEN_INJECTOR | SUBCUTANEOUS | 1 refills | Status: DC

## 2023-03-21 NOTE — Telephone Encounter (Signed)
Advanced Heart Failure Patient Advocate Encounter  Patient has BMS assistance application in process. Pt will bring in OOP Expense report.  Medication Samples have been left at registration desk for patient pick up. Drug name: Eliquis 5 MG Qty: 4x 14 ct packages LOT: [id number] Exp.: 07/2024 SIG: Take 1 tablet by mouth twice daily   The patient has been instructed regarding the correct time, dose, and frequency of taking this medication, including desired effects and most common side effects.   Burnell Blanks, CPhT Rx Patient Advocate Phone: [phone]

## 2023-03-21 NOTE — Assessment & Plan Note (Signed)
  Patient has not met goal of at least 5% of body weight loss with comprehensive lifestyle modifications alone in the past 3-6 months. Pharmacotherapy is appropriate to pursue as augmentation. Will start Ozempic.   Confirmed patient has no personal or family history of medullary thyroid carcinoma (MTC) or Multiple Endocrine Neoplasia syndrome type 2 (MEN 2).   Advised patient on common side effects including nausea, diarrhea, dyspepsia, decreased appetite, and fatigue. Counseled patient on reducing meal size and how to titrate medication to minimize side effects. Patient aware to call if intolerable side effects or if experiencing dehydration, abdominal pain, or dizziness. Patient will adhere to dietary modifications and will target at least 150 minutes of moderate intensity exercise weekly.   Injection technique reviewed at today's visit.    Titration Plan:  Will plan to follow the titration plan as below, pending patient is tolerating each dose before increasing to the next. Can slow titration if needed for tolerability.    -Month 1: Inject 0.25 mg SQ once weekly x 4 weeks -Month 2: Inject 0.5 mg SQ once weekly x 6 weeks -Month 3: Inject 1 mg SQ once weekly x 4 weeks -Month 4+: Inject 2 mg SQ once weekly   Follow up with patient in 3 months.

## 2023-03-21 NOTE — Telephone Encounter (Signed)
Mr. Savard text me to confirm his paramedicine home visit.  I replied visit scheduled 03/21/23 @ 1:00.  He responded that he would need to reschedule his visit.   Next home visit rescheduled 6/13 @ 1:00.    Beatrix Shipper, EMT-Paramedic [phone] 03/21/2023

## 2023-03-22 ENCOUNTER — Ambulatory Visit (HOSPITAL_COMMUNITY)
Admission: RE | Admit: 2023-03-22 | Discharge: 2023-03-22 | Disposition: A | Discharge status: Home / Self Care | Payer: Medicare HMO | Source: Ambulatory Visit | Attending: Cardiology | Admitting: Cardiology

## 2023-03-22 DIAGNOSIS — I5022 Chronic systolic (congestive) heart failure: Secondary | ICD-10-CM | POA: Diagnosis not present

## 2023-03-22 LAB — BASIC METABOLIC PANEL
Anion gap: 8 (ref 5–15)
BUN: 20 mg/dL (ref 8–23)
CO2: 28 mmol/L (ref 22–32)
Calcium: 9.3 mg/dL (ref 8.9–10.3)
Chloride: 102 mmol/L (ref 98–111)
Creatinine, Ser: 1.16 mg/dL (ref 0.61–1.24)
GFR, Estimated: >60 mL/min (ref >60)
Glucose, Bld: 121 mg/dL — ABNORMAL HIGH (ref 70–99)
Potassium: 4 mmol/L (ref 3.5–5.1)
Sodium: 138 mmol/L (ref 135–145)

## 2023-03-27 NOTE — Telephone Encounter (Signed)
Created in error

## 2023-03-28 NOTE — Progress Notes (Signed)
[name] [address] [place] [address] Phone: [phone] Subjective:   [name], am serving as a scribe for Dr. Antoine Primas.  I'm seeing this patient by the request  of:  [name], MD  CC: Bilateral shoulder pain  [id number]  01/18/2023 Chronic problem with worsening symptoms.  Secondary to chronic comorbidities and patient's social determinants of health including patient having difficulty with physical activity due to his comorbidities the patient has difficulty with the shoulders and likely is not a surgical candidate with the congestive heart failure at the moment.  Still think that we do want to consider him getting healthier to allow potential placement.  Injections given again today and hopefully this will make significant improvement.  Does have acromioclavicular arthritis that may need to be further assessed in 6 to 8 weeks.  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 male coming in with complaint of B shoulder pain. Pain has become constant.  Patient continues to have pain that does affect daily activities.  Sometimes the shoulder pain also gives him trouble with daily activities such as dressing.  Does wake him up at night.       Past Medical History:  Diagnosis Date   AICD (automatic cardioverter/defibrillator) present    Anemia    supposed to be taking Vit B but doesn't   ANXIETY    takes Xanax nightly   Arthritis    Asthma    Albuterol prn and Advair daily;also takes Prednisone daily   Cardiomyopathy (HCC)    a. EF 25% TEE July 2013; b. EF normalized 2015;  c. 03/2015 Echo: EF 40-45%, difrf HK, PASP 38 mmHg, Mild MR, sev LAE/RAE.   Chronic constipation    takes OTC stool softener   COPD (chronic obstructive pulmonary  disease) (HCC)    "one dr says COPD; one dr says emphysema" (09/18/2017)   COVID-19 12/09/2019   DEPRESSION    takes Zoloft and Doxepin daily   Diverticulitis    DYSKINESIA, ESOPHAGUS    Essential hypertension        FIBROMYALGIA    GERD (gastroesophageal reflux disease)        Glaucoma    HYPERLIPIDEMIA    a. Intolerant to statins.   INSOMNIA    takes Ambien nightly   Myocardial infarction [facility])    a. 2012 Myoview notable for prior infarct;  b. 03/2015 Lexiscan CL: EF 37%, diff HK, small area of inferior infarct from apex to base-->Med Rx.   O2 dependent    "2.5L q hs & prn" (09/18/2017)   Paroxysmal atrial fibrillation (HCC)    a. CHA2DS2VASc = 3--> takes Coumadin;  b. 03/15/2015 Successful TEE/DCCV;  c. 03/2015 recurrent afib, Amio d/c'd in setting of hyperthyroidism.   Peripheral neuropathy    Pneumonia 12/2016   Rash and other nonspecific skin eruption 04/12/2009   no cause found saw dermatologists x 2 and allergist   SLEEP APNEA, OBSTRUCTIVE    a. doesn't use CPAP   Syncope    a. 03/2015 s/p MDT LINQ.   Type II diabetes mellitus (HCC)        Past Surgical History:  Procedure Laterality Date   ACNE CYST REMOVAL     2 on back    AV NODE ABLATION N/A 10/25/2017  Procedure: AV NODE ABLATION;  Surgeon: Duke Salvia, MD;  Location: [facility] INVASIVE CV LAB;  Service: Cardiovascular;  Laterality: N/A;   BIV ICD INSERTION CRT-D N/A 09/18/2017   Procedure: BIV ICD INSERTION CRT-D;  Surgeon: Duke Salvia, MD;  Location: [facility] INVASIVE CV LAB;  Service: Cardiovascular;  Laterality: N/A;   CARDIAC CATHETERIZATION N/A 03/21/2016   Procedure: Right/Left Heart Cath and Coronary Angiography;  Surgeon: Laurey Morale, MD;  Location: [facility] INVASIVE CV LAB;  Service: Cardiovascular;  Laterality: N/A;   CARDIOVERSION  04/18/2012   Procedure: CARDIOVERSION;  Surgeon: Pricilla Riffle, MD;  Location: [facility] OR;  Service: Cardiovascular;  Laterality: N/A;   CARDIOVERSION  04/25/2012   Procedure: CARDIOVERSION;   Surgeon: Vesta Mixer, MD;  Location: [facility] ENDOSCOPY;  Service: Cardiovascular;  Laterality: N/A;   CARDIOVERSION  04/25/2012   Procedure: CARDIOVERSION;  Surgeon: Pricilla Riffle, MD;  Location: [facility] OR;  Service: Cardiovascular;  Laterality: N/A;   CARDIOVERSION  05/09/2012   Procedure: CARDIOVERSION;  Surgeon: Tonny Bollman, MD;  Location: [facility] OR;  Service: Cardiovascular;  Laterality: N/A;  changed from crenshaw to cooper by trish/leone-endo   CARDIOVERSION N/A 03/15/2015   Procedure: CARDIOVERSION;  Surgeon: Vesta Mixer, MD;  Location: [facility] ENDOSCOPY;  Service: Cardiovascular;  Laterality: N/A;   COLONOSCOPY     COLONOSCOPY WITH PROPOFOL N/A 10/21/2014   Procedure: COLONOSCOPY WITH PROPOFOL;  Surgeon: Meryl Dare, MD;  Location: [facility] ENDOSCOPY;  Service: Endoscopy;  Laterality: N/A;   EP IMPLANTABLE DEVICE N/A 04/06/2015   Procedure: Loop Recorder Insertion;  Surgeon: Marinus Maw, MD;  Location: [facility] INVASIVE CV LAB;  Service: Cardiovascular;  Laterality: N/A;   ESOPHAGOGASTRODUODENOSCOPY     JOINT REPLACEMENT     LOOP RECORDER REMOVAL N/A 09/18/2017   Procedure: LOOP RECORDER REMOVAL;  Surgeon: Duke Salvia, MD;  Location: [facility] INVASIVE CV LAB;  Service: Cardiovascular;  Laterality: N/A;   RIGHT/LEFT HEART CATH AND CORONARY ANGIOGRAPHY N/A 01/28/2017   Procedure: Right/Left Heart Cath and Coronary Angiography;  Surgeon: Laurey Morale, MD;  Location: [facility] INVASIVE CV LAB;  Service: Cardiovascular;  Laterality: N/A;   TEE WITHOUT CARDIOVERSION  04/25/2012   Procedure: TRANSESOPHAGEAL ECHOCARDIOGRAM (TEE);  Surgeon: Vesta Mixer, MD;  Location: [facility] ENDOSCOPY;  Service: Cardiovascular;  Laterality: N/A;   TEE WITHOUT CARDIOVERSION N/A 03/15/2015   Procedure: TRANSESOPHAGEAL ECHOCARDIOGRAM (TEE);  Surgeon: Vesta Mixer, MD;  Location: [facility] ENDOSCOPY;  Service: Cardiovascular;  Laterality: N/A;   TONSILLECTOMY AND ADENOIDECTOMY     TOTAL KNEE ARTHROPLASTY Right 06/15/2014   Procedure: TOTAL KNEE  ARTHROPLASTY;  Surgeon: Sheral Apley, MD;  Location: [facility] OR;  Service: Orthopedics;  Laterality: Right;   TOTAL KNEE ARTHROPLASTY Left 10/17/2021   Procedure: Left TOTAL KNEE ARTHROPLASTY;  Surgeon: Kathryne Hitch, MD;  Location: [facility] OR;  Service: Orthopedics;  Laterality: Left;   Social History   Socioeconomic History   Marital status: Divorced    Spouse name: Not on file   Number of children: 2   Years of education: Not on file   Highest education level: Not on file  Occupational History   Occupation: retired/disabled. prev worked in Teacher, English as a foreign language.    Employer: DISABLED  Tobacco Use   Smoking status: Former    Packs/day: 2.00    Years: 30.00    Additional pack years: 0.00    Total pack years: 60.00    Types: Cigarettes    Quit date: 10/16/2007    Years since quitting: 15.4  Smokeless tobacco: Never  Vaping Use   Vaping Use: Never used  Substance and Sexual Activity   Alcohol use: No   Drug use: No   Sexual activity: Not Currently  Other Topics Concern   Not on file  Social History Narrative   Lives alone.   Social Determinants of Health   Financial Resource Strain: Low Risk  (06/29/2022)   Overall Financial Resource Strain (CARDIA)    Difficulty of Paying Living Expenses: Not hard at all  Food Insecurity: No Food Insecurity (11/26/2022)   Hunger Vital Sign    Worried About Running Out of Food in the Last Year: Never true    Ran Out of Food in the Last Year: Never true  Transportation Needs: No Transportation Needs (11/26/2022)   PRAPARE - Administrator, Civil Service (Medical): No    Lack of Transportation (Non-Medical): No  Physical Activity: Inactive (06/29/2022)   Exercise Vital Sign    Days of Exercise per Week: 0 days    Minutes of Exercise per Session: 0 min  Stress: No Stress Concern Present (06/29/2022)   [employer] of Occupational Health - Occupational Stress Questionnaire    Feeling of Stress : Not at all  Social  Connections: Moderately Integrated (06/29/2022)   Social Connection and Isolation Panel [NHANES]    Frequency of Communication with Friends and Family: More than three times a week    Frequency of Social Gatherings with Friends and Family: More than three times a week    Attends Religious Services: More than 4 times per year    Active Member of [employer] or Organizations: Yes    Attends Engineer, structural: More than 4 times per year    Marital Status: Divorced   Allergies  Allergen Reactions   Tape Other (See Comments)    SKIN TEARS VERY, VERY EASILY!!!! Use Paper Tape Only, PLEASE!!!   Amiodarone Other (See Comments)    Caused hyperthyroidism    Statins Other (See Comments)    Myalgias    Wound Dressing Adhesive Other (See Comments)    Skin Tears Use Paper Tape Only   Family History  Problem Relation Age of Onset   COPD Mother    Asthma Mother    Colon polyps Mother    Allergies Mother    Hypothyroidism Mother    Asthma Maternal Grandmother    Colon cancer Neg Hx     Current Outpatient Medications (Endocrine & Metabolic):    predniSONE (DELTASONE) 10 MG tablet, Take 1 tablet (10 mg total) by mouth daily with breakfast.   Semaglutide,0.25 or 0.5MG /DOS, 2 MG/3ML SOPN, Inject 0.25 mg once weekly for 4 weeks then increase to 0.5 mg weekly for 6 weeks  Current Outpatient Medications (Cardiovascular):    carvedilol (COREG) 6.25 MG tablet, TAKE 1 TABLET BY MOUTH TWICE A DAY   eplerenone (INSPRA) 25 MG tablet, Take 1 tablet (25 mg total) by mouth daily.   Evolocumab (REPATHA SURECLICK) 140 MG/ML SOAJ, Inject 140 mg into the skin every 14 (fourteen) days.   losartan (COZAAR) 25 MG tablet, Take 1 tablet (25 mg total) by mouth daily.   torsemide (DEMADEX) 20 MG tablet, Take 60 mg by mouth 2 (two) times daily.  Current Outpatient Medications (Respiratory):    albuterol (VENTOLIN HFA) 108 (90 Base) MCG/ACT inhaler, Inhale 1-2 puffs into the lungs every 6 (six) hours as needed  for wheezing or shortness of breath.   fluticasone (FLONASE) 50 MCG/ACT nasal spray, PLACE 2 SPRAYS  INTO BOTH NOSTRILS DAILY AS NEEDED FOR ALLERGIES OR RHINITIS.   fluticasone-salmeterol (WIXELA INHUB) 250-50 MCG/ACT AEPB, Inhale 1 puff into the lungs in the morning and at bedtime.   ipratropium-albuterol (DUONEB) 0.5-2.5 (3) MG/3ML SOLN, TAKE 3 ML BY NEBULIZATION EVERY 4 HOURS AS NEEDED (WHEEZING).   promethazine (PHENERGAN) 25 MG tablet, Take 1 tablet (25 mg total) by mouth every 8 (eight) hours as needed for nausea or vomiting.  Current Outpatient Medications (Analgesics):    acetaminophen (TYLENOL) 500 MG tablet, Take 500-1,000 mg by mouth every 6 (six) hours as needed for mild pain or headache.   traMADol (ULTRAM) 50 MG tablet, TAKE 1 TABLET BY MOUTH EVERY DAY AS NEEDED  Current Outpatient Medications (Hematological):    apixaban (ELIQUIS) 5 MG TABS tablet, Take 1 tablet (5 mg total) by mouth 2 (two) times daily.  Current Outpatient Medications (Other):    ALPRAZolam (XANAX) 0.5 MG tablet, TAKE 2 TABLETS (1 MG TOTAL) BY MOUTH EVERY DAY AT BEDTIME   ammonium lactate (AMLACTIN) 12 % cream, Apply 1 Application topically as needed for dry skin.   augmented betamethasone dipropionate (DIPROLENE-AF) 0.05 % cream, Apply 1 application  topically daily as needed (to affected areas- for itching).   carisoprodol (SOMA) 350 MG tablet, TAKE 1 TABLET BY MOUTH THREE TIMES A DAY AS NEEDED FOR MUSCLE SPASM   Cholecalciferol (VITAMIN D3) 1000 units CAPS, Take 1,000 Units by mouth daily.   clotrimazole-betamethasone (LOTRISONE) cream, Apply 1 Application topically daily.   doxepin (SINEQUAN) 10 MG capsule, TAKE 2 CAPSULES BY MOUTH AT BEDTIME AS NEEDED.   DULoxetine (CYMBALTA) 60 MG capsule, Take 60 mg by mouth daily.   Dupilumab (DUPIXENT) 300 MG/2ML SOPN, Inject 300 mg into the skin every 14 (fourteen) days.   fluconazole (DIFLUCAN) 100 MG tablet, Take 1 tablet (100 mg total) by mouth daily.   hydrOXYzine  (ATARAX) 10 MG tablet, TAKE 1 TABLET BY MOUTH 3 TIMES DAILY AS NEEDED FOR ITCHING. Strength: 10 mg   ketoconazole (NIZORAL) 2 % cream, Apply 1 Application topically daily.   KLOR-CON M20 20 MEQ tablet, TAKE 1 TABLET BY MOUTH EVERY DAY   omeprazole (PRILOSEC) 20 MG capsule, TAKE 1 CAPSULE BY MOUTH TWICE A DAY   ondansetron (ZOFRAN) 4 MG tablet, Take 1 tablet (4 mg total) by mouth every 6 (six) hours as needed for nausea.   Respiratory Therapy Supplies (FULL KIT NEBULIZER SET) MISC, 1 each by Does not apply route as needed.   silver sulfADIAZINE (SILVADENE) 1 % cream, APPLY TOPICALLY TO AFFECTED AREA EVERY DAY   tamsulosin (FLOMAX) 0.4 MG CAPS capsule, TAKE 1 CAPSULE BY MOUTH EVERY DAY   traZODone (DESYREL) 100 MG tablet, TAKE 2 TABLETS BY MOUTH AT BEDTIME AS NEEDED   TUMS 500 MG chewable tablet, Chew 500-2,000 mg by mouth every 4 (four) hours as needed for indigestion or heartburn.   venlafaxine XR (EFFEXOR XR) 150 MG 24 hr capsule, Take 1 capsule (150 mg total) by mouth daily with breakfast.   zolpidem (AMBIEN) 10 MG tablet, TAKE 1 TABLET BY MOUTH EVERY DAY AT BEDTIME AS NEEDED   Reviewed prior external information including notes and imaging from  primary care provider As well as notes that were available from care everywhere and other healthcare systems.  Past medical history, social, surgical and family history all reviewed in electronic medical record.  No pertanent information unless stated regarding to the chief complaint.   Review of Systems:  No headache, visual changes, nausea, vomiting, diarrhea, constipation, dizziness,  abdominal pain, skin rash, fevers, chills, night sweats, weight loss, swollen lymph nodes,  joint swelling, chest pain, shortness of breath, mood changes. POSITIVE muscle aches, body aches  Objective  Blood pressure 122/76, pulse 78, height 5\' 10"  (1.778 m), weight 275 lb (124.7 kg), SpO2 93 %.   General: No apparent distress alert and oriented x3 mood and affect  normal, dressed appropriately.  HEENT: Pupils equal, extraocular movements intact  Respiratory: Patient's speak in full sentences and does not appear short of breath  Cardiovascular: 1+ lower extremity edema, non tender, no erythema  Bilateral shoulders do have crepitus noted.  Patient does have some even lack of musculature in the left shoulder girdle. Antalgic gait noted   Procedure: Real-time Ultrasound Guided Injection of right glenohumeral joint Device: GE Logiq Q7  Ultrasound guided injection is preferred based studies that show increased duration, increased effect, greater accuracy, decreased procedural pain, increased response rate with ultrasound guided versus blind injection.  Verbal informed consent obtained.  Time-out conducted.  Noted no overlying erythema, induration, or other signs of local infection.  Skin prepped in a sterile fashion.  Local anesthesia: Topical Ethyl chloride.  With sterile technique and under real time ultrasound guidance:  Joint visualized.  23g 1  inch needle inserted posterior approach. Pictures taken for needle placement. Patient did have injection of , 2 cc of 0.5% Marcaine, and 1.0 cc of Kenalog 40 mg/dL. Completed without difficulty  Pain immediately resolved suggesting accurate placement of the medication.  Advised to call if fevers/chills, erythema, induration, drainage, or persistent bleeding.  Impression: Technically successful ultrasound guided injection.  Procedure: Real-time Ultrasound Guided Injection of left glenohumeral joint Device: GE Logiq E  Ultrasound guided injection is preferred based studies that show increased duration, increased effect, greater accuracy, decreased procedural pain, increased response rate with ultrasound guided versus blind injection.  Verbal informed consent obtained.  Time-out conducted.  Noted no overlying erythema, induration, or other signs of local infection.  Skin prepped in a sterile fashion.  Local  anesthesia: Topical Ethyl chloride.  With sterile technique and under real time ultrasound guidance:  Joint visualized.  21g 2 inch needle inserted posterior approach. Pictures taken for needle placement. Patient did have injection of 2 cc of 0.5% Marcaine, and 1cc of Kenalog 40 mg/dL. Completed without difficulty  Pain immediately resolved suggesting accurate placement of the medication.  Advised to call if fevers/chills, erythema, induration, drainage, or persistent bleeding.   Impression: Technically successful ultrasound guided injection.   Impression and Recommendations:     The above documentation has been reviewed and is accurate and complete Judi Saa, DO

## 2023-03-29 ENCOUNTER — Other Ambulatory Visit (HOSPITAL_COMMUNITY): Payer: Self-pay | Admitting: Emergency Medicine

## 2023-03-29 ENCOUNTER — Other Ambulatory Visit: Payer: Self-pay

## 2023-03-29 ENCOUNTER — Ambulatory Visit: Payer: Medicare HMO | Admitting: Family Medicine

## 2023-03-29 ENCOUNTER — Encounter: Payer: Self-pay | Admitting: Family Medicine

## 2023-03-29 VITALS — BP 122/76 | HR 78 | Ht 70.0 in | Wt 275.0 lb

## 2023-03-29 DIAGNOSIS — M12812 Other specific arthropathies, not elsewhere classified, left shoulder: Secondary | ICD-10-CM

## 2023-03-29 DIAGNOSIS — G8929 Other chronic pain: Secondary | ICD-10-CM | POA: Diagnosis not present

## 2023-03-29 DIAGNOSIS — M12811 Other specific arthropathies, not elsewhere classified, right shoulder: Secondary | ICD-10-CM

## 2023-03-29 DIAGNOSIS — M25511 Pain in right shoulder: Secondary | ICD-10-CM

## 2023-03-29 DIAGNOSIS — M25512 Pain in left shoulder: Secondary | ICD-10-CM | POA: Diagnosis not present

## 2023-03-29 NOTE — Patient Instructions (Signed)
Injected both shoulders today See me again in 2 months 

## 2023-03-29 NOTE — Progress Notes (Signed)
Paramedicine Encounter    Patient ID: [name], male    DOB: [birth year], 67 y.o.   MRN: [id number]   Complaints NONE  Assessment A&O x 4, skin W&D w/ good color.  No peripheral edema noted.  Compliance with meds Advises he is compliant w/ same.  Pill box filled n/a  Refills needed none  Meds changes since last visit NONE    Social changes NONE   BP 108/73 (BP Location: Right Arm, Patient Position: Sitting, Cuff Size: Normal)   Pulse 74   Resp 16   SpO2 96%  Weight yesterday-not taken Last visit weight-268lb  [name] asked that I meet him for a visit @ Dr. Michaelle Copas office today as a convenience to [place] both.  He was A&O x 4, skin W&D w/ good color.  Pt denies chest pain or SOB.  Lung sounds clear and equal bilat.  No peripheral edema noted.   Advised [name] that I will be out of the office next week.  Katie and Herbert Seta will have my phone so he can still reach someone at my number should he have needs. Also advised him the can reach out to HF Triage and he is aware of same.  In case of an emergency encouraged him to call 911.  ACTION: Home visit completed  Bethanie Dicker [phone] 03/29/23  Patient Care Team: [name], MD as PCP - General (Internal Medicine) Laurey Morale, MD as PCP - Advanced Heart Failure (Cardiology) Duke Salvia, MD as PCP - Electrophysiology (Cardiology) Himmelrich, Loree Fee, RD (Inactive) as Dietitian Duke Salvia, MD as Consulting Physician (Cardiology) Kathryne Hitch, MD as Consulting Physician (Orthopedic Surgery) Levy Pupa, MD as Referring Physician (Ophthalmology)  Patient Active Problem List   Diagnosis Date Noted   Shingles 03/03/2023   Epigastric pain 01/16/2023   Nausea and vomiting 01/16/2023   Cardiac arrhythmia 01/16/2023   Cellulitis and abscess of leg, except foot 11/21/2022   Cellulitis of right lower extremity without foot 11/20/2022   Anxiety and depression 05/20/2022   AC  (acromioclavicular) arthritis 04/24/2022   Cataract, nuclear sclerotic, left eye 03/07/2022   ED (erectile dysfunction) 01/03/2022   Intertrigo 12/24/2021   Gluteal tendinitis of left buttock 12/19/2021   Unilateral primary osteoarthritis, left knee 10/17/2021   Status post total knee replacement, left 10/17/2021   Rib pain on right side 10/10/2021   History of colonic polyps 09/26/2021   Bursitis of left hip 06/13/2021   Tooth fracture 06/11/2021   Pain and swelling of right lower leg 05/18/2021   Gastrointestinal hemorrhage    Diverticulitis of colon    Preop exam for internal medicine 03/04/2021   B12 deficiency 03/04/2021   Vitamin D deficiency 03/01/2021   Myalgia 12/01/2020   Laceration of left leg 09/17/2020   Statin myopathy 08/02/2020   Rotator cuff arthropathy of both shoulders 02/23/2020   (HFpEF) heart failure with preserved ejection fraction (HCC) 02/22/2020   ICD (implantable cardioverter-defibrillator) in place 12/27/2019   Elevated PSA 09/23/2019   Lip lesion 12/23/2018   Diabetic ulcer of left lower leg with necrosis of muscle (HCC) 12/05/2018   Chronic bursitis of left shoulder 07/01/2018   Pain due to total knee replacement (HCC) 03/31/2018   Rotator cuff arthropathy, left 02/13/2018   Right rotator cuff tear arthropathy 01/14/2018   Increased prostate specific antigen (PSA) velocity 09/26/2017   HFrEF (heart failure with reduced ejection fraction) (HCC) 09/18/2017   Trigger point of thoracic region 07/04/2017  Right lumbar radiculopathy 05/15/2017   Right leg swelling 05/09/2017   Glaucoma 04/18/2017   History of nasal polyposis 02/19/2017   Chronic rhinitis 02/19/2017   Microhematuria 12/04/2016   Hyperlipidemia 08/15/2016   Encounter for well adult exam with abnormal findings 11/29/2015   Syncope 03/30/2015   Former tobacco use 02/25/2015   Type 2 diabetes mellitus (HCC) 02/25/2015   Permanent atrial fibrillation (HCC) 02/25/2015   Anemia 02/23/2015    Thyrotoxicosis 02/23/2015   Hx of adenomatous colonic polyps 10/21/2014   De Quervain's tenosynovitis, right 04/30/2014   Osteoarthritis of right knee 02/19/2014   Encounter for therapeutic drug monitoring 11/17/2013   Thoracic degenerative disc disease 06/17/2013   Thoracic spondylosis without myelopathy 02/11/2013   Lumbosacral spondylosis without myelopathy 02/11/2013   Degenerative joint disease of knee, left 02/11/2013   COPD (chronic obstructive pulmonary disease) (HCC) 08/20/2012   Bundle branch block, right and extreme left anterior fascicular 05/05/2012   Nonischemic cardiomyopathy (HCC) 05/02/2012   Anticoagulated on warfarin 03/21/2012   Spongiotic dermatitis 09/26/2011   Past myocardial infarction 04/19/2011   UNSPECIFIED URETHRAL STRICTURE 12/26/2010   Chronic pain syndrome 05/18/2010   OSA (obstructive sleep apnea) 10/21/2007   NEPHROLITHIASIS, HX OF 10/21/2007   GERD (gastroesophageal reflux disease) 06/09/2007   Benign prostatic hyperplasia 06/09/2007   Morbid obesity (HCC) 06/03/2007   Depression 06/03/2007   Essential hypertension 06/03/2007   INSOMNIA 06/03/2007    Current Outpatient Medications:    acetaminophen (TYLENOL) 500 MG tablet, Take 500-1,000 mg by mouth every 6 (six) hours as needed for mild pain or headache., Disp: , Rfl:    albuterol (VENTOLIN HFA) 108 (90 Base) MCG/ACT inhaler, Inhale 1-2 puffs into the lungs every 6 (six) hours as needed for wheezing or shortness of breath., Disp: 18 g, Rfl: 5   ALPRAZolam (XANAX) 0.5 MG tablet, TAKE 2 TABLETS (1 MG TOTAL) BY MOUTH EVERY DAY AT BEDTIME, Disp: 60 tablet, Rfl: 5   ammonium lactate (AMLACTIN) 12 % cream, Apply 1 Application topically as needed for dry skin., Disp: 385 g, Rfl: 3   apixaban (ELIQUIS) 5 MG TABS tablet, Take 1 tablet (5 mg total) by mouth 2 (two) times daily., Disp: 60 tablet, Rfl: 11   augmented betamethasone dipropionate (DIPROLENE-AF) 0.05 % cream, Apply 1 application  topically daily  as needed (to affected areas- for itching)., Disp: , Rfl:    carisoprodol (SOMA) 350 MG tablet, TAKE 1 TABLET BY MOUTH THREE TIMES A DAY AS NEEDED FOR MUSCLE SPASM, Disp: 90 tablet, Rfl: 2   carvedilol (COREG) 6.25 MG tablet, TAKE 1 TABLET BY MOUTH TWICE A DAY, Disp: 180 tablet, Rfl: 3   Cholecalciferol (VITAMIN D3) 1000 units CAPS, Take 1,000 Units by mouth daily., Disp: , Rfl:    clotrimazole-betamethasone (LOTRISONE) cream, Apply 1 Application topically daily., Disp: 30 g, Rfl: 0   doxepin (SINEQUAN) 10 MG capsule, TAKE 2 CAPSULES BY MOUTH AT BEDTIME AS NEEDED., Disp: 180 capsule, Rfl: 1   DULoxetine (CYMBALTA) 60 MG capsule, Take 60 mg by mouth daily., Disp: , Rfl:    Dupilumab (DUPIXENT) 300 MG/2ML SOPN, Inject 300 mg into the skin every 14 (fourteen) days., Disp: , Rfl:    eplerenone (INSPRA) 25 MG tablet, Take 1 tablet (25 mg total) by mouth daily., Disp: 30 tablet, Rfl: 8   Evolocumab (REPATHA SURECLICK) 140 MG/ML SOAJ, Inject 140 mg into the skin every 14 (fourteen) days., Disp: 6 mL, Rfl: 3   fluticasone (FLONASE) 50 MCG/ACT nasal spray, PLACE 2 SPRAYS INTO BOTH  NOSTRILS DAILY AS NEEDED FOR ALLERGIES OR RHINITIS., Disp: 48 mL, Rfl: 1   fluticasone-salmeterol (WIXELA INHUB) 250-50 MCG/ACT AEPB, Inhale 1 puff into the lungs in the morning and at bedtime., Disp: 60 each, Rfl: 11   hydrOXYzine (ATARAX) 10 MG tablet, TAKE 1 TABLET BY MOUTH 3 TIMES DAILY AS NEEDED FOR ITCHING. Strength: 10 mg, Disp: 60 tablet, Rfl: 2   ipratropium-albuterol (DUONEB) 0.5-2.5 (3) MG/3ML SOLN, TAKE 3 ML BY NEBULIZATION EVERY 4 HOURS AS NEEDED (WHEEZING)., Disp: 360 mL, Rfl: 0   ketoconazole (NIZORAL) 2 % cream, Apply 1 Application topically daily., Disp: 120 g, Rfl: 1   KLOR-CON M20 20 MEQ tablet, TAKE 1 TABLET BY MOUTH EVERY DAY, Disp: 90 tablet, Rfl: 3   losartan (COZAAR) 25 MG tablet, Take 1 tablet (25 mg total) by mouth daily., Disp: 90 tablet, Rfl: 3   omeprazole (PRILOSEC) 20 MG capsule, TAKE 1 CAPSULE BY  MOUTH TWICE A DAY, Disp: 180 capsule, Rfl: 1   ondansetron (ZOFRAN) 4 MG tablet, Take 1 tablet (4 mg total) by mouth every 6 (six) hours as needed for nausea., Disp: 40 tablet, Rfl: 0   promethazine (PHENERGAN) 25 MG tablet, Take 1 tablet (25 mg total) by mouth every 8 (eight) hours as needed for nausea or vomiting., Disp: 30 tablet, Rfl: 0   Respiratory Therapy Supplies (FULL KIT NEBULIZER SET) MISC, 1 each by Does not apply route as needed., Disp: 1 each, Rfl: 0   Semaglutide,0.25 or 0.5MG /DOS, 2 MG/3ML SOPN, Inject 0.25 mg once weekly for 4 weeks then increase to 0.5 mg weekly for 6 weeks, Disp: 3 mL, Rfl: 1   silver sulfADIAZINE (SILVADENE) 1 % cream, APPLY TOPICALLY TO AFFECTED AREA EVERY DAY, Disp: 50 g, Rfl: 3   tamsulosin (FLOMAX) 0.4 MG CAPS capsule, TAKE 1 CAPSULE BY MOUTH EVERY DAY, Disp: 90 capsule, Rfl: 3   torsemide (DEMADEX) 20 MG tablet, Take 60 mg by mouth 2 (two) times daily., Disp: , Rfl:    traMADol (ULTRAM) 50 MG tablet, TAKE 1 TABLET BY MOUTH EVERY DAY AS NEEDED, Disp: 30 tablet, Rfl: 5   traZODone (DESYREL) 100 MG tablet, TAKE 2 TABLETS BY MOUTH AT BEDTIME AS NEEDED, Disp: 180 tablet, Rfl: 1   TUMS 500 MG chewable tablet, Chew 500-2,000 mg by mouth every 4 (four) hours as needed for indigestion or heartburn., Disp: , Rfl:    venlafaxine XR (EFFEXOR XR) 150 MG 24 hr capsule, Take 1 capsule (150 mg total) by mouth daily with breakfast., Disp: 90 capsule, Rfl: 3   zolpidem (AMBIEN) 10 MG tablet, TAKE 1 TABLET BY MOUTH EVERY DAY AT BEDTIME AS NEEDED, Disp: 90 tablet, Rfl: 1   fluconazole (DIFLUCAN) 100 MG tablet, Take 1 tablet (100 mg total) by mouth daily. (Patient not taking: Reported on 03/29/2023), Disp: 7 tablet, Rfl: 0   predniSONE (DELTASONE) 10 MG tablet, Take 1 tablet (10 mg total) by mouth daily with breakfast., Disp: 30 tablet, Rfl: 0 Allergies  Allergen Reactions   Tape Other (See Comments)    SKIN TEARS VERY, VERY EASILY!!!! Use Paper Tape Only, PLEASE!!!    Amiodarone Other (See Comments)    Caused hyperthyroidism    Statins Other (See Comments)    Myalgias    Wound Dressing Adhesive Other (See Comments)    Skin Tears Use Paper Tape Only     Social History   Socioeconomic History   Marital status: Divorced    Spouse name: Not on file   Number of children: 2  Years of education: Not on file   Highest education level: Not on file  Occupational History   Occupation: retired/disabled. prev worked in Teacher, English as a foreign language.    Employer: DISABLED  Tobacco Use   Smoking status: Former    Packs/day: 2.00    Years: 30.00    Additional pack years: 0.00    Total pack years: 60.00    Types: Cigarettes    Quit date: 10/16/2007    Years since quitting: 15.4   Smokeless tobacco: Never  Vaping Use   Vaping Use: Never used  Substance and Sexual Activity   Alcohol use: No   Drug use: No   Sexual activity: Not Currently  Other Topics Concern   Not on file  Social History Narrative   Lives alone.   Social Determinants of Health   Financial Resource Strain: Low Risk  (06/29/2022)   Overall Financial Resource Strain (CARDIA)    Difficulty of Paying Living Expenses: Not hard at all  Food Insecurity: No Food Insecurity (11/26/2022)   Hunger Vital Sign    Worried About Running Out of Food in the Last Year: Never true    Ran Out of Food in the Last Year: Never true  Transportation Needs: No Transportation Needs (11/26/2022)   PRAPARE - Administrator, Civil Service (Medical): No    Lack of Transportation (Non-Medical): No  Physical Activity: Inactive (06/29/2022)   Exercise Vital Sign    Days of Exercise per Week: 0 days    Minutes of Exercise per Session: 0 min  Stress: No Stress Concern Present (06/29/2022)   [employer] of Occupational Health - Occupational Stress Questionnaire    Feeling of Stress : Not at all  Social Connections: Moderately Integrated (06/29/2022)   Social Connection and Isolation Panel [NHANES]     Frequency of Communication with Friends and Family: More than three times a week    Frequency of Social Gatherings with Friends and Family: More than three times a week    Attends Religious Services: More than 4 times per year    Active Member of Clubs or Organizations: Yes    Attends Banker Meetings: More than 4 times per year    Marital Status: Divorced  Intimate Partner Violence: Not At Risk (11/22/2022)   Humiliation, Afraid, Rape, and Kick questionnaire    Fear of Current or Ex-Partner: No    Emotionally Abused: No    Physically Abused: No    Sexually Abused: No    Physical Exam      Future Appointments  Date Time Provider Department Center  04/08/2023  7:15 AM [facility] DEVICE REMOTES [facility]  04/24/2023  9:05 AM [facility] DEVICE REMOTES [facility]  05/02/2023  1:30 PM Luciano Cutter, MD [facility]  05/21/2023  1:45 PM Edwin Cap, DPM [facility]  05/24/2023  3:00 PM Judi Saa, DO [facility] None  06/12/2023  2:00 PM [facility] PA/NP [facility] None  07/01/2023  1:20 PM [name], MD [facility] None  07/24/2023  9:05 AM [facility] DEVICE REMOTES [facility]  10/23/2023  9:05 AM [facility] DEVICE REMOTES [facility]  01/22/2024  9:05 AM [facility] DEVICE REMOTES [facility]  04/22/2024  9:05 AM [facility] DEVICE REMOTES [facility]

## 2023-03-29 NOTE — Assessment & Plan Note (Signed)
Chronic problem with worsening symptoms again.  Bilateral injections given again today.  Discussed again about the possibility of doing this 10 to 12 weeks if necessary but would like to try to space them out when possible.  Increase activity slowly.  Follow-up with me again in 10 to 12 weeks for further evaluation and treatment.

## 2023-04-08 ENCOUNTER — Ambulatory Visit: Payer: Medicare HMO | Attending: Internal Medicine

## 2023-04-08 DIAGNOSIS — Z95810 Presence of automatic (implantable) cardiac defibrillator: Secondary | ICD-10-CM

## 2023-04-08 DIAGNOSIS — I5022 Chronic systolic (congestive) heart failure: Secondary | ICD-10-CM | POA: Diagnosis not present

## 2023-04-10 NOTE — Progress Notes (Signed)
EPIC Encounter for ICM Monitoring  Patient Name: [name] is a 67 y.o. male Date: 04/10/2023 Primary Care Physican: [name], MD Primary Cardiologist: Shirlee Latch Electrophysiologist: Joycelyn Schmid Pacing: 99.7%    10/02/2022 Weight: 265 lbs 01/24/2023 Weight: 260 lbs     Spoke with patient and heart failure questions reviewed.  Transmission results reviewed.  Pt asymptomatic for fluid accumulation.  He has not been able to take his prescribed dosage in the last month and taking less than prescribed.      Optivol thoracic impedance suggesting possible fluid accumulation starting 6/4 and trending back toward baseline.   Prescribed:  Torsemide 20 mg 3 tablet(s) (60 mg total) by mouth twice day.  He asked Dr Shirlee Latch if could take 4 tablets in the morning and 2 tablets in the evening and was approved per patient.   Potassium 20 mEq take 1 tablet (20 mEq total) daily   Labs: 03/22/2023 Creatinine 1.16, BUN 20, Potassium 4.0, Sodium 138, GFR >60 02/18/2023 Creatinine 1.20, BUN 23, Potassium 4.4, Sodium 135, GFR >60 01/25/2023 Creatinine 1.22, BUN 37, Potassium 3.6, Sodium 138, GFR >60 01/15/2023 Creatinine 1.38, BUN 22, Potassium 4.6, Sodium 134, GFR 52.70  12/31/2022 Creatinine 1.27, BUN 36, Potassium 5.2, Sodium 136, GFR 58.24  12/18/2022 Creatinine 1.13, BUN 36, Potassium 4.5, Sodium 135  11/24/2022 Creatinine 1.45, BUN 45, Potassium 3.2, Sodium 135, GFR 53 (12:09 PM) 11/24/2022 Creatinine 1.59, BUN 51, Potassium 4.0, Sodium 138, GFR 47 (6:01 AM) A complete set of results can be found in Results Review.   Recommendations:   Advised to take the dosage prescribed to help with fluid accumulation.     Follow-up plan: ICM clinic phone appointment on 04/16/2023 to recheck fluid levels.   91 day device clinic remote transmission 04/24/2023.   EP/Cardiology Office Visits:    Recall 11/28/2023 with Dr Graciela Husbands.  06/12/2023 with HF clinic.   Copy of ICM check sent to Dr. Graciela Husbands.   3 month ICM trend:  04/10/2023.    12-14 Month ICM trend:     Karie Soda, RN 04/10/2023 12:07 PM

## 2023-04-11 ENCOUNTER — Other Ambulatory Visit: Payer: Self-pay | Admitting: Internal Medicine

## 2023-04-16 ENCOUNTER — Ambulatory Visit: Payer: Medicare HMO | Attending: Internal Medicine

## 2023-04-16 ENCOUNTER — Other Ambulatory Visit: Payer: Self-pay | Admitting: Internal Medicine

## 2023-04-16 DIAGNOSIS — Z95810 Presence of automatic (implantable) cardiac defibrillator: Secondary | ICD-10-CM

## 2023-04-16 DIAGNOSIS — I5022 Chronic systolic (congestive) heart failure: Secondary | ICD-10-CM

## 2023-04-17 NOTE — Progress Notes (Signed)
EPIC Encounter for ICM Monitoring  Patient Name: [name] is a 67 y.o. male Date: 04/17/2023 Primary Care Physican: [name], MD Primary Cardiologist: Shirlee Latch Electrophysiologist: Joycelyn Schmid Pacing: 99.9%    10/02/2022 Weight: 265 lbs 04/17/2023 Weight: 266 lbs     Spoke with patient and heart failure questions reviewed.  Transmission results reviewed.  Pt asymptomatic for fluid accumulation.  He has not been able to take his prescribed dosage in the last month and taking less than prescribed.      Optivol thoracic impedance suggesting fluid levels returned to normal.   Prescribed:  Torsemide 20 mg 3 tablet(s) (60 mg total) by mouth twice day.  He asked Dr Shirlee Latch if could take 4 tablets in the morning and 2 tablets in the evening and was approved per patient.   Potassium 20 mEq take 1 tablet (20 mEq total) daily   Labs: 03/22/2023 Creatinine 1.16, BUN 20, Potassium 4.0, Sodium 138, GFR >60 02/18/2023 Creatinine 1.20, BUN 23, Potassium 4.4, Sodium 135, GFR >60 01/25/2023 Creatinine 1.22, BUN 37, Potassium 3.6, Sodium 138, GFR >60 01/15/2023 Creatinine 1.38, BUN 22, Potassium 4.6, Sodium 134, GFR 52.70  12/31/2022 Creatinine 1.27, BUN 36, Potassium 5.2, Sodium 136, GFR 58.24  12/18/2022 Creatinine 1.13, BUN 36, Potassium 4.5, Sodium 135  11/24/2022 Creatinine 1.45, BUN 45, Potassium 3.2, Sodium 135, GFR 53 (12:09 PM) 11/24/2022 Creatinine 1.59, BUN 51, Potassium 4.0, Sodium 138, GFR 47 (6:01 AM) A complete set of results can be found in Results Review.   Recommendations:   No changes and encouraged to call if experiencing any fluid symptoms.    Follow-up plan: ICM clinic phone appointment on 05/13/2023.   91 day device clinic remote transmission 04/24/2023.   EP/Cardiology Office Visits:    Recall 11/28/2023 with Dr Graciela Husbands.  06/12/2023 with HF clinic.   Copy of ICM check sent to Dr. Graciela Husbands.   3 month ICM trend: 04/16/2023.    12-14 Month ICM trend:     Karie Soda,  RN 04/17/2023 1:16 PM

## 2023-04-19 ENCOUNTER — Encounter (HOSPITAL_COMMUNITY): Payer: Self-pay

## 2023-04-19 ENCOUNTER — Other Ambulatory Visit (HOSPITAL_COMMUNITY): Payer: Self-pay | Admitting: Emergency Medicine

## 2023-04-19 ENCOUNTER — Other Ambulatory Visit (HOSPITAL_COMMUNITY): Payer: Self-pay

## 2023-04-19 NOTE — Telephone Encounter (Signed)
Medication Samples have been provided to the patient.  Drug name: Eliquis       Strength: 5 mg         Qty: 4 boxes  LOT: [id number]  Exp.Date: 10/25  Dosing instructions: take 1 tablet Twice daily   The patient has been instructed regarding the correct time, dose, and frequency of taking this medication, including desired effects and most common side effects.   Jaiveon Suppes Judie Petit Shauntel Prest 12:47 PM 04/19/2023

## 2023-04-21 NOTE — Progress Notes (Signed)
Paramedicine Encounter    Patient ID: [name], male    DOB: [birth year], 67 y.o.   MRN: [id number]   Complaints NONE  Assessment A&O x 4, skin W&D w/ good color.  Lung sounds clear throughout.  No edema noted to extremities  Compliance with meds per pt. 100%  Pill box filled pt prefers to take meds from bottles   Refills needed NONE  Meds changes since last visit NONE    Social changes NONE   BP 100/70 (BP Location: Left Arm, Patient Position: Sitting, Cuff Size: Normal)   Pulse 70   Wt 270 lb (122.5 kg)   SpO2 96%   BMI 38.74 kg/m  Weight yesterday-not taken Last visit weight-268lb   Provided [name] with 1 months work of samples from [facility] for Eliquis 5mg .  Discussed with him once again that he needs to get a print out from his pharmacy of out of pocket expense in order to see if is still in the donut hole.  He advised he would get this at his next pharmacy visit and will drop it off at the [facility] and give me a call when he has done so.  ACTION: Home visit completed  [name] [phone] 04/21/23  Patient Care Team: [name], MD as PCP - General (Internal Medicine) Laurey Morale, MD as PCP - Advanced Heart Failure (Cardiology) Duke Salvia, MD as PCP - Electrophysiology (Cardiology) Himmelrich, Loree Fee, RD (Inactive) as Dietitian Duke Salvia, MD as Consulting Physician (Cardiology) Kathryne Hitch, MD as Consulting Physician (Orthopedic Surgery) Levy Pupa, MD as Referring Physician (Ophthalmology)  Patient Active Problem List   Diagnosis Date Noted   Shingles 03/03/2023   Epigastric pain 01/16/2023   Nausea and vomiting 01/16/2023   Cardiac arrhythmia 01/16/2023   Cellulitis and abscess of leg, except foot 11/21/2022   Cellulitis of right lower extremity without foot 11/20/2022   Anxiety and depression 05/20/2022   AC (acromioclavicular) arthritis 04/24/2022   Cataract, nuclear sclerotic, left  eye 03/07/2022   ED (erectile dysfunction) 01/03/2022   Intertrigo 12/24/2021   Gluteal tendinitis of left buttock 12/19/2021   Unilateral primary osteoarthritis, left knee 10/17/2021   Status post total knee replacement, left 10/17/2021   Rib pain on right side 10/10/2021   History of colonic polyps 09/26/2021   Bursitis of left hip 06/13/2021   Tooth fracture 06/11/2021   Pain and swelling of right lower leg 05/18/2021   Gastrointestinal hemorrhage    Diverticulitis of colon    Preop exam for internal medicine 03/04/2021   B12 deficiency 03/04/2021   Vitamin D deficiency 03/01/2021   Myalgia 12/01/2020   Laceration of left leg 09/17/2020   Statin myopathy 08/02/2020   Rotator cuff arthropathy of both shoulders 02/23/2020   (HFpEF) heart failure with preserved ejection fraction (HCC) 02/22/2020   ICD (implantable cardioverter-defibrillator) in place 12/27/2019   Elevated PSA 09/23/2019   Lip lesion 12/23/2018   Diabetic ulcer of left lower leg with necrosis of muscle (HCC) 12/05/2018   Chronic bursitis of left shoulder 07/01/2018   Pain due to total knee replacement (HCC) 03/31/2018   Rotator cuff arthropathy, left 02/13/2018   Right rotator cuff tear arthropathy 01/14/2018   Increased prostate specific antigen (PSA) velocity 09/26/2017   HFrEF (heart failure with reduced ejection fraction) (HCC) 09/18/2017   Trigger point of thoracic region 07/04/2017   Right lumbar radiculopathy 05/15/2017   Right leg swelling 05/09/2017   Glaucoma 04/18/2017  History of nasal polyposis 02/19/2017   Chronic rhinitis 02/19/2017   Microhematuria 12/04/2016   Hyperlipidemia 08/15/2016   Encounter for well adult exam with abnormal findings 11/29/2015   Syncope 03/30/2015   Former tobacco use 02/25/2015   Type 2 diabetes mellitus (HCC) 02/25/2015   Permanent atrial fibrillation (HCC) 02/25/2015   Anemia 02/23/2015   Thyrotoxicosis 02/23/2015   Hx of adenomatous colonic polyps 10/21/2014    De Quervain's tenosynovitis, right 04/30/2014   Osteoarthritis of right knee 02/19/2014   Encounter for therapeutic drug monitoring 11/17/2013   Thoracic degenerative disc disease 06/17/2013   Thoracic spondylosis without myelopathy 02/11/2013   Lumbosacral spondylosis without myelopathy 02/11/2013   Degenerative joint disease of knee, left 02/11/2013   COPD (chronic obstructive pulmonary disease) (HCC) 08/20/2012   Bundle branch block, right and extreme left anterior fascicular 05/05/2012   Nonischemic cardiomyopathy (HCC) 05/02/2012   Anticoagulated on warfarin 03/21/2012   Spongiotic dermatitis 09/26/2011   Past myocardial infarction 04/19/2011   UNSPECIFIED URETHRAL STRICTURE 12/26/2010   Chronic pain syndrome 05/18/2010   OSA (obstructive sleep apnea) 10/21/2007   NEPHROLITHIASIS, HX OF 10/21/2007   GERD (gastroesophageal reflux disease) 06/09/2007   Benign prostatic hyperplasia 06/09/2007   Morbid obesity (HCC) 06/03/2007   Depression 06/03/2007   Essential hypertension 06/03/2007   INSOMNIA 06/03/2007    Current Outpatient Medications:    acetaminophen (TYLENOL) 500 MG tablet, Take 500-1,000 mg by mouth every 6 (six) hours as needed for mild pain or headache., Disp: , Rfl:    albuterol (VENTOLIN HFA) 108 (90 Base) MCG/ACT inhaler, Inhale 1-2 puffs into the lungs every 6 (six) hours as needed for wheezing or shortness of breath., Disp: 18 g, Rfl: 5   ALPRAZolam (XANAX) 0.5 MG tablet, TAKE 2 TABLETS (1 MG TOTAL) BY MOUTH EVERY DAY AT BEDTIME, Disp: 60 tablet, Rfl: 5   ammonium lactate (AMLACTIN) 12 % cream, Apply 1 Application topically as needed for dry skin., Disp: 385 g, Rfl: 3   apixaban (ELIQUIS) 5 MG TABS tablet, Take 1 tablet (5 mg total) by mouth 2 (two) times daily., Disp: 60 tablet, Rfl: 11   augmented betamethasone dipropionate (DIPROLENE-AF) 0.05 % cream, Apply 1 application  topically daily as needed (to affected areas- for itching)., Disp: , Rfl:    carisoprodol  (SOMA) 350 MG tablet, TAKE 1 TABLET BY MOUTH THREE TIMES A DAY AS NEEDED FOR MUSCLE SPASM, Disp: 90 tablet, Rfl: 2   carvedilol (COREG) 6.25 MG tablet, TAKE 1 TABLET BY MOUTH TWICE A DAY, Disp: 180 tablet, Rfl: 3   Cholecalciferol (VITAMIN D3) 1000 units CAPS, Take 1,000 Units by mouth daily., Disp: , Rfl:    clotrimazole-betamethasone (LOTRISONE) cream, Apply 1 Application topically daily., Disp: 30 g, Rfl: 0   doxepin (SINEQUAN) 10 MG capsule, TAKE 2 CAPSULES BY MOUTH AT BEDTIME AS NEEDED., Disp: 180 capsule, Rfl: 1   DULoxetine (CYMBALTA) 60 MG capsule, Take 60 mg by mouth daily., Disp: , Rfl:    Dupilumab (DUPIXENT) 300 MG/2ML SOPN, Inject 300 mg into the skin every 14 (fourteen) days., Disp: , Rfl:    eplerenone (INSPRA) 25 MG tablet, Take 1 tablet (25 mg total) by mouth daily., Disp: 30 tablet, Rfl: 8   Evolocumab (REPATHA SURECLICK) 140 MG/ML SOAJ, Inject 140 mg into the skin every 14 (fourteen) days., Disp: 6 mL, Rfl: 3   hydrOXYzine (ATARAX) 10 MG tablet, TAKE 1 TABLET BY MOUTH 3 TIMES DAILY AS NEEDED FOR ITCHING. Strength: 10 mg, Disp: 60 tablet, Rfl: 2  ipratropium-albuterol (DUONEB) 0.5-2.5 (3) MG/3ML SOLN, TAKE 3 ML BY NEBULIZATION EVERY 4 HOURS AS NEEDED (WHEEZING)., Disp: 360 mL, Rfl: 0   ketoconazole (NIZORAL) 2 % cream, Apply 1 Application topically daily., Disp: 120 g, Rfl: 1   KLOR-CON M20 20 MEQ tablet, TAKE 1 TABLET BY MOUTH EVERY DAY, Disp: 90 tablet, Rfl: 3   losartan (COZAAR) 25 MG tablet, Take 1 tablet (25 mg total) by mouth daily., Disp: 90 tablet, Rfl: 3   omeprazole (PRILOSEC) 20 MG capsule, TAKE 1 CAPSULE BY MOUTH TWICE A DAY, Disp: 180 capsule, Rfl: 1   ondansetron (ZOFRAN) 4 MG tablet, Take 1 tablet (4 mg total) by mouth every 6 (six) hours as needed for nausea., Disp: 40 tablet, Rfl: 0   promethazine (PHENERGAN) 25 MG tablet, TAKE 1 TABLET BY MOUTH EVERY 8 HOURS AS NEEDED FOR NAUSEA OR VOMITING., Disp: 30 tablet, Rfl: 0   Respiratory Therapy Supplies (FULL KIT  NEBULIZER SET) MISC, 1 each by Does not apply route as needed., Disp: 1 each, Rfl: 0   Semaglutide,0.25 or 0.5MG /DOS, 2 MG/3ML SOPN, Inject 0.25 mg once weekly for 4 weeks then increase to 0.5 mg weekly for 6 weeks, Disp: 3 mL, Rfl: 1   silver sulfADIAZINE (SILVADENE) 1 % cream, APPLY TOPICALLY TO AFFECTED AREA EVERY DAY, Disp: 50 g, Rfl: 3   tamsulosin (FLOMAX) 0.4 MG CAPS capsule, TAKE 1 CAPSULE BY MOUTH EVERY DAY, Disp: 90 capsule, Rfl: 3   torsemide (DEMADEX) 20 MG tablet, Take 60 mg by mouth 2 (two) times daily., Disp: , Rfl:    traMADol (ULTRAM) 50 MG tablet, TAKE 1 TABLET BY MOUTH EVERY DAY AS NEEDED, Disp: 30 tablet, Rfl: 5   traZODone (DESYREL) 100 MG tablet, TAKE 2 TABLETS BY MOUTH AT BEDTIME AS NEEDED, Disp: 180 tablet, Rfl: 1   TUMS 500 MG chewable tablet, Chew 500-2,000 mg by mouth every 4 (four) hours as needed for indigestion or heartburn., Disp: , Rfl:    venlafaxine XR (EFFEXOR XR) 150 MG 24 hr capsule, Take 1 capsule (150 mg total) by mouth daily with breakfast., Disp: 90 capsule, Rfl: 3   zolpidem (AMBIEN) 10 MG tablet, TAKE 1 TABLET BY MOUTH EVERY DAY AT BEDTIME AS NEEDED, Disp: 90 tablet, Rfl: 1   fluconazole (DIFLUCAN) 100 MG tablet, Take 1 tablet (100 mg total) by mouth daily. (Patient not taking: Reported on 03/29/2023), Disp: 7 tablet, Rfl: 0   fluticasone (FLONASE) 50 MCG/ACT nasal spray, PLACE 2 SPRAYS INTO BOTH NOSTRILS DAILY AS NEEDED FOR ALLERGIES OR RHINITIS., Disp: 48 mL, Rfl: 1   fluticasone-salmeterol (WIXELA INHUB) 250-50 MCG/ACT AEPB, Inhale 1 puff into the lungs in the morning and at bedtime., Disp: 60 each, Rfl: 11   predniSONE (DELTASONE) 10 MG tablet, Take 1 tablet (10 mg total) by mouth daily with breakfast. (Patient not taking: Reported on 04/19/2023), Disp: 30 tablet, Rfl: 0 Allergies  Allergen Reactions   Tape Other (See Comments)    SKIN TEARS VERY, VERY EASILY!!!! Use Paper Tape Only, PLEASE!!!   Amiodarone Other (See Comments)    Caused hyperthyroidism     Statins Other (See Comments)    Myalgias    Wound Dressing Adhesive Other (See Comments)    Skin Tears Use Paper Tape Only     Social History   Socioeconomic History   Marital status: Divorced    Spouse name: Not on file   Number of children: 2   Years of education: Not on file   Highest education level: Not  on file  Occupational History   Occupation: retired/disabled. prev worked in Teacher, English as a foreign language.    Employer: DISABLED  Tobacco Use   Smoking status: Former    Packs/day: 2.00    Years: 30.00    Additional pack years: 0.00    Total pack years: 60.00    Types: Cigarettes    Quit date: 10/16/2007    Years since quitting: 15.5   Smokeless tobacco: Never  Vaping Use   Vaping Use: Never used  Substance and Sexual Activity   Alcohol use: No   Drug use: No   Sexual activity: Not Currently  Other Topics Concern   Not on file  Social History Narrative   Lives alone.   Social Determinants of Health   Financial Resource Strain: Low Risk  (06/29/2022)   Overall Financial Resource Strain (CARDIA)    Difficulty of Paying Living Expenses: Not hard at all  Food Insecurity: No Food Insecurity (11/26/2022)   Hunger Vital Sign    Worried About Running Out of Food in the Last Year: Never true    Ran Out of Food in the Last Year: Never true  Transportation Needs: No Transportation Needs (11/26/2022)   PRAPARE - Administrator, Civil Service (Medical): No    Lack of Transportation (Non-Medical): No  Physical Activity: Inactive (06/29/2022)   Exercise Vital Sign    Days of Exercise per Week: 0 days    Minutes of Exercise per Session: 0 min  Stress: No Stress Concern Present (06/29/2022)   [employer] of Occupational Health - Occupational Stress Questionnaire    Feeling of Stress : Not at all  Social Connections: Moderately Integrated (06/29/2022)   Social Connection and Isolation Panel [NHANES]    Frequency of Communication with Friends and Family: More than  three times a week    Frequency of Social Gatherings with Friends and Family: More than three times a week    Attends Religious Services: More than 4 times per year    Active Member of Clubs or Organizations: Yes    Attends Banker Meetings: More than 4 times per year    Marital Status: Divorced  Intimate Partner Violence: Not At Risk (11/22/2022)   Humiliation, Afraid, Rape, and Kick questionnaire    Fear of Current or Ex-Partner: No    Emotionally Abused: No    Physically Abused: No    Sexually Abused: No    Physical Exam      Future Appointments  Date Time Provider Department Center  04/24/2023  9:05 AM [facility] DEVICE REMOTES [facility]  05/02/2023  1:30 PM Luciano Cutter, MD [facility]  05/13/2023  7:30 AM [facility] DEVICE REMOTES [facility]  05/21/2023  1:45 PM Edwin Cap, DPM [facility]  05/24/2023  3:00 PM Judi Saa, DO [facility] None  06/12/2023  2:00 PM [facility] PA/NP [facility] None  07/01/2023  1:20 PM [name], MD [facility] None  07/24/2023  9:05 AM [facility] DEVICE REMOTES [facility]  10/23/2023  9:05 AM [facility] DEVICE REMOTES [facility]  01/22/2024  9:05 AM [facility] DEVICE REMOTES [facility]  04/22/2024  9:05 AM [facility] DEVICE REMOTES [facility]

## 2023-04-24 ENCOUNTER — Encounter (HOSPITAL_BASED_OUTPATIENT_CLINIC_OR_DEPARTMENT_OTHER): Payer: Self-pay | Admitting: Pulmonary Disease

## 2023-04-24 ENCOUNTER — Other Ambulatory Visit: Payer: Self-pay | Admitting: Family Medicine

## 2023-04-24 ENCOUNTER — Ambulatory Visit: Payer: Medicare HMO

## 2023-04-24 ENCOUNTER — Encounter: Payer: Self-pay | Admitting: Internal Medicine

## 2023-04-24 DIAGNOSIS — I442 Atrioventricular block, complete: Secondary | ICD-10-CM

## 2023-04-24 DIAGNOSIS — B029 Zoster without complications: Secondary | ICD-10-CM

## 2023-04-24 MED ORDER — ERYTHROMYCIN 5 MG/GM OP OINT: 1.0000 | TOPICAL_OINTMENT | Freq: Three times a day (TID) | OPHTHALMIC | 0 refills | Status: DC

## 2023-04-25 LAB — CUP PACEART REMOTE DEVICE CHECK
Battery Remaining Longevity: 18 mo
Battery Voltage: 2.9 V
Brady Statistic AP VP Percent: 0 %
Brady Statistic AP VS Percent: 0 %
Brady Statistic AS VP Percent: 0 %
Brady Statistic AS VS Percent: 0 %
Brady Statistic RA Percent Paced: 0 %
Brady Statistic RV Percent Paced: 99.96 %
Date Time Interrogation Session: 20240710134150
HighPow Impedance: 83 Ohm
Implantable Lead Connection Status: 753985
Implantable Lead Connection Status: 753985
Implantable Lead Implant Date: 20181205
Implantable Lead Implant Date: 20181205
Implantable Lead Location: 753858
Implantable Lead Location: 753860
Implantable Lead Model: 4398
Implantable Pulse Generator Implant Date: 20181205
Lead Channel Impedance Value: 1083 Ohm
Lead Channel Impedance Value: 1121 Ohm
Lead Channel Impedance Value: 1178 Ohm
Lead Channel Impedance Value: 1197 Ohm
Lead Channel Impedance Value: 250.943
Lead Channel Impedance Value: 292.308
Lead Channel Impedance Value: 300.368
Lead Channel Impedance Value: 312.941
Lead Channel Impedance Value: 322.197
Lead Channel Impedance Value: 361 Ohm
Lead Channel Impedance Value: 361 Ohm
Lead Channel Impedance Value: 4047 Ohm
Lead Channel Impedance Value: 475 Ohm
Lead Channel Impedance Value: 532 Ohm
Lead Channel Impedance Value: 760 Ohm
Lead Channel Impedance Value: 817 Ohm
Lead Channel Impedance Value: 836 Ohm
Lead Channel Impedance Value: 931 Ohm
Lead Channel Pacing Threshold Amplitude: 0.375 V
Lead Channel Pacing Threshold Pulse Width: 0.4 ms
Lead Channel Sensing Intrinsic Amplitude: 17.125 mV
Lead Channel Sensing Intrinsic Amplitude: 17.125 mV
Lead Channel Setting Pacing Amplitude: 2.5 V
Lead Channel Setting Pacing Amplitude: 2.5 V
Lead Channel Setting Pacing Pulse Width: 0.4 ms
Lead Channel Setting Pacing Pulse Width: 1 ms
Lead Channel Setting Sensing Sensitivity: 0.3 mV
Zone Setting Status: 755011

## 2023-04-29 ENCOUNTER — Other Ambulatory Visit: Payer: Self-pay | Admitting: Family Medicine

## 2023-04-29 DIAGNOSIS — L304 Erythema intertrigo: Secondary | ICD-10-CM

## 2023-05-01 ENCOUNTER — Telehealth (HOSPITAL_COMMUNITY): Payer: Self-pay | Admitting: Licensed Clinical Social Worker

## 2023-05-01 NOTE — Telephone Encounter (Signed)
HF Paramedicine Team Based Care Meeting  Three Months Post Enrollment Assessment  HF MD- NA  HF NP - Amy Clegg NP-C   [facility] HF Paramedicine  [name]  [facility] admit within the last 30 days for heart failure?   Medications concerns? Reports he is taking medications as prescibed but not open to using pillbox so hard to know level of compliance.  SDOH concerns? Financial concerns with affording meds but not doing what he needs to to complete PAP  [facility] Referrals Placed: no  Barriers to discharge? Believe patient is appropriate for DC at this time- don't think he can benefit further from the program    Burna Sis, LCSW Clinical Social Worker [facility] Desk#: [phone] Cell#: [phone]

## 2023-05-02 ENCOUNTER — Ambulatory Visit (HOSPITAL_BASED_OUTPATIENT_CLINIC_OR_DEPARTMENT_OTHER): Payer: Medicare HMO | Admitting: Pulmonary Disease

## 2023-05-02 ENCOUNTER — Other Ambulatory Visit: Payer: Self-pay | Admitting: Family Medicine

## 2023-05-04 ENCOUNTER — Other Ambulatory Visit: Payer: Self-pay | Admitting: Internal Medicine

## 2023-05-04 DIAGNOSIS — I4819 Other persistent atrial fibrillation: Secondary | ICD-10-CM

## 2023-05-04 DIAGNOSIS — I255 Ischemic cardiomyopathy: Secondary | ICD-10-CM

## 2023-05-04 DIAGNOSIS — I428 Other cardiomyopathies: Secondary | ICD-10-CM

## 2023-05-14 ENCOUNTER — Emergency Department (HOSPITAL_COMMUNITY): Payer: Medicare HMO

## 2023-05-14 ENCOUNTER — Observation Stay (HOSPITAL_COMMUNITY)
Admission: EM | Admit: 2023-05-14 | Discharge: 2023-05-15 | Disposition: A | Discharge status: Home / Self Care | Payer: Medicare HMO | Attending: Student | Admitting: Student

## 2023-05-14 ENCOUNTER — Other Ambulatory Visit: Payer: Self-pay

## 2023-05-14 ENCOUNTER — Encounter (HOSPITAL_COMMUNITY): Payer: Self-pay

## 2023-05-14 DIAGNOSIS — S81011A Laceration without foreign body, right knee, initial encounter: Secondary | ICD-10-CM | POA: Insufficient documentation

## 2023-05-14 DIAGNOSIS — J45909 Unspecified asthma, uncomplicated: Secondary | ICD-10-CM | POA: Diagnosis not present

## 2023-05-14 DIAGNOSIS — I959 Hypotension, unspecified: Secondary | ICD-10-CM | POA: Diagnosis not present

## 2023-05-14 DIAGNOSIS — Z96651 Presence of right artificial knee joint: Secondary | ICD-10-CM | POA: Diagnosis not present

## 2023-05-14 DIAGNOSIS — J90 Pleural effusion, not elsewhere classified: Secondary | ICD-10-CM | POA: Diagnosis not present

## 2023-05-14 DIAGNOSIS — Z8616 Personal history of COVID-19: Secondary | ICD-10-CM | POA: Insufficient documentation

## 2023-05-14 DIAGNOSIS — S12601A Unspecified nondisplaced fracture of seventh cervical vertebra, initial encounter for closed fracture: Secondary | ICD-10-CM | POA: Diagnosis not present

## 2023-05-14 DIAGNOSIS — Z96653 Presence of artificial knee joint, bilateral: Secondary | ICD-10-CM | POA: Diagnosis not present

## 2023-05-14 DIAGNOSIS — R0689 Other abnormalities of breathing: Secondary | ICD-10-CM | POA: Diagnosis not present

## 2023-05-14 DIAGNOSIS — S12690A Other displaced fracture of seventh cervical vertebra, initial encounter for closed fracture: Secondary | ICD-10-CM | POA: Diagnosis not present

## 2023-05-14 DIAGNOSIS — I10 Essential (primary) hypertension: Secondary | ICD-10-CM | POA: Diagnosis present

## 2023-05-14 DIAGNOSIS — I5020 Unspecified systolic (congestive) heart failure: Secondary | ICD-10-CM | POA: Diagnosis not present

## 2023-05-14 DIAGNOSIS — G894 Chronic pain syndrome: Secondary | ICD-10-CM | POA: Diagnosis not present

## 2023-05-14 DIAGNOSIS — S299XXA Unspecified injury of thorax, initial encounter: Secondary | ICD-10-CM | POA: Diagnosis not present

## 2023-05-14 DIAGNOSIS — I480 Paroxysmal atrial fibrillation: Secondary | ICD-10-CM | POA: Insufficient documentation

## 2023-05-14 DIAGNOSIS — M25511 Pain in right shoulder: Secondary | ICD-10-CM | POA: Diagnosis present

## 2023-05-14 DIAGNOSIS — M12811 Other specific arthropathies, not elsewhere classified, right shoulder: Secondary | ICD-10-CM | POA: Diagnosis present

## 2023-05-14 DIAGNOSIS — Z7901 Long term (current) use of anticoagulants: Secondary | ICD-10-CM | POA: Insufficient documentation

## 2023-05-14 DIAGNOSIS — M79604 Pain in right leg: Secondary | ICD-10-CM | POA: Diagnosis not present

## 2023-05-14 DIAGNOSIS — W19XXXA Unspecified fall, initial encounter: Secondary | ICD-10-CM | POA: Insufficient documentation

## 2023-05-14 DIAGNOSIS — Z95810 Presence of automatic (implantable) cardiac defibrillator: Secondary | ICD-10-CM | POA: Diagnosis not present

## 2023-05-14 DIAGNOSIS — S4981XA Other specified injuries of right shoulder and upper arm, initial encounter: Secondary | ICD-10-CM | POA: Diagnosis not present

## 2023-05-14 DIAGNOSIS — Z87891 Personal history of nicotine dependence: Secondary | ICD-10-CM | POA: Insufficient documentation

## 2023-05-14 DIAGNOSIS — E119 Type 2 diabetes mellitus without complications: Secondary | ICD-10-CM | POA: Diagnosis not present

## 2023-05-14 DIAGNOSIS — M79605 Pain in left leg: Secondary | ICD-10-CM | POA: Diagnosis not present

## 2023-05-14 DIAGNOSIS — S81012A Laceration without foreign body, left knee, initial encounter: Secondary | ICD-10-CM | POA: Diagnosis not present

## 2023-05-14 DIAGNOSIS — J449 Chronic obstructive pulmonary disease, unspecified: Secondary | ICD-10-CM | POA: Diagnosis not present

## 2023-05-14 DIAGNOSIS — M12812 Other specific arthropathies, not elsewhere classified, left shoulder: Secondary | ICD-10-CM | POA: Diagnosis not present

## 2023-05-14 DIAGNOSIS — I110 Hypertensive heart disease with heart failure: Secondary | ICD-10-CM | POA: Diagnosis not present

## 2023-05-14 DIAGNOSIS — Z96652 Presence of left artificial knee joint: Secondary | ICD-10-CM | POA: Diagnosis not present

## 2023-05-14 DIAGNOSIS — F32A Depression, unspecified: Secondary | ICD-10-CM | POA: Diagnosis present

## 2023-05-14 DIAGNOSIS — I5022 Chronic systolic (congestive) heart failure: Secondary | ICD-10-CM | POA: Insufficient documentation

## 2023-05-14 DIAGNOSIS — M25519 Pain in unspecified shoulder: Secondary | ICD-10-CM | POA: Diagnosis not present

## 2023-05-14 DIAGNOSIS — Z79899 Other long term (current) drug therapy: Secondary | ICD-10-CM | POA: Insufficient documentation

## 2023-05-14 DIAGNOSIS — G4733 Obstructive sleep apnea (adult) (pediatric): Secondary | ICD-10-CM | POA: Diagnosis not present

## 2023-05-14 DIAGNOSIS — Z043 Encounter for examination and observation following other accident: Secondary | ICD-10-CM | POA: Diagnosis not present

## 2023-05-14 DIAGNOSIS — T07XXXA Unspecified multiple injuries, initial encounter: Secondary | ICD-10-CM | POA: Diagnosis not present

## 2023-05-14 DIAGNOSIS — J4489 Other specified chronic obstructive pulmonary disease: Secondary | ICD-10-CM | POA: Diagnosis present

## 2023-05-14 DIAGNOSIS — S0990XA Unspecified injury of head, initial encounter: Secondary | ICD-10-CM | POA: Diagnosis not present

## 2023-05-14 DIAGNOSIS — N400 Enlarged prostate without lower urinary tract symptoms: Secondary | ICD-10-CM | POA: Diagnosis present

## 2023-05-14 DIAGNOSIS — J438 Other emphysema: Secondary | ICD-10-CM

## 2023-05-14 DIAGNOSIS — I5030 Unspecified diastolic (congestive) heart failure: Secondary | ICD-10-CM | POA: Diagnosis present

## 2023-05-14 DIAGNOSIS — W1830XA Fall on same level, unspecified, initial encounter: Secondary | ICD-10-CM | POA: Diagnosis not present

## 2023-05-14 LAB — CBC WITH DIFFERENTIAL/PLATELET
Abs Immature Granulocytes: 0.18 10*3/uL — ABNORMAL HIGH (ref 0.00–0.07)
Basophils Absolute: 0 10*3/uL (ref 0.0–0.1)
Basophils Relative: 0 %
Eosinophils Absolute: 0 10*3/uL (ref 0.0–0.5)
Eosinophils Relative: 0 %
HCT: 38.5 % — ABNORMAL LOW (ref 39.0–52.0)
Hemoglobin: 12.1 g/dL — ABNORMAL LOW (ref 13.0–17.0)
Immature Granulocytes: 2 %
Lymphocytes Relative: 14 %
Lymphs Abs: 1.6 10*3/uL (ref 0.7–4.0)
MCH: 32.6 pg (ref 26.0–34.0)
MCHC: 31.4 g/dL (ref 30.0–36.0)
MCV: 103.8 fL — ABNORMAL HIGH (ref 80.0–100.0)
Monocytes Absolute: 1.1 10*3/uL — ABNORMAL HIGH (ref 0.1–1.0)
Monocytes Relative: 10 %
Neutro Abs: 8.7 10*3/uL — ABNORMAL HIGH (ref 1.7–7.7)
Neutrophils Relative %: 74 %
Platelets: 241 10*3/uL (ref 150–400)
RBC: 3.71 MIL/uL — ABNORMAL LOW (ref 4.22–5.81)
RDW: 13.4 % (ref 11.5–15.5)
WBC: 11.7 10*3/uL — ABNORMAL HIGH (ref 4.0–10.5)
nRBC: 0 % (ref 0.0–0.2)

## 2023-05-14 LAB — COMPREHENSIVE METABOLIC PANEL
ALT: 29 U/L (ref 0–44)
AST: 20 U/L (ref 15–41)
Albumin: 3.1 g/dL — ABNORMAL LOW (ref 3.5–5.0)
Alkaline Phosphatase: 43 U/L (ref 38–126)
Anion gap: 10 (ref 5–15)
BUN: 37 mg/dL — ABNORMAL HIGH (ref 8–23)
CO2: 26 mmol/L (ref 22–32)
Calcium: 8.5 mg/dL — ABNORMAL LOW (ref 8.9–10.3)
Chloride: 102 mmol/L (ref 98–111)
Creatinine, Ser: 1.23 mg/dL (ref 0.61–1.24)
GFR, Estimated: >60 mL/min (ref >60)
Glucose, Bld: 113 mg/dL — ABNORMAL HIGH (ref 70–99)
Potassium: 4.2 mmol/L (ref 3.5–5.1)
Sodium: 138 mmol/L (ref 135–145)
Total Bilirubin: 0.2 mg/dL — ABNORMAL LOW (ref 0.3–1.2)
Total Protein: 5.6 g/dL — ABNORMAL LOW (ref 6.5–8.1)

## 2023-05-14 LAB — PROTIME-INR
INR: 1 (ref 0.8–1.2)
Prothrombin Time: 13.2 seconds (ref 11.4–15.2)

## 2023-05-14 MED ORDER — ALPRAZOLAM 0.5 MG PO TABS
0.5000 mg | ORAL_TABLET | Freq: Once | ORAL | Status: AC
  Administered 2023-05-14: 0.5 mg via ORAL
  Filled 2023-05-14: qty 1

## 2023-05-14 MED ORDER — ACETAMINOPHEN 325 MG PO TABS
650.0000 mg | ORAL_TABLET | Freq: Four times a day (QID) | ORAL | Status: DC
  Administered 2023-05-14 – 2023-05-15 (×5): 650 mg via ORAL
  Filled 2023-05-14 (×5): qty 2

## 2023-05-14 MED ORDER — HYDROMORPHONE HCL 1 MG/ML IJ SOLN
0.5000 mg | INTRAMUSCULAR | Status: DC | PRN
  Administered 2023-05-14 (×2): 0.5 mg via INTRAVENOUS
  Filled 2023-05-14 (×2): qty 0.5

## 2023-05-14 MED ORDER — ONDANSETRON HCL 4 MG PO TABS: 4.0000 mg | ORAL_TABLET | Freq: Four times a day (QID) | ORAL | Status: DC | PRN

## 2023-05-14 MED ORDER — LIDOCAINE-EPINEPHRINE (PF) 2 %-1:200000 IJ SOLN
INTRAMUSCULAR | Status: AC
  Filled 2023-05-14: qty 20

## 2023-05-14 MED ORDER — FENTANYL CITRATE PF 50 MCG/ML IJ SOSY
25.0000 ug | PREFILLED_SYRINGE | INTRAMUSCULAR | Status: DC | PRN
  Administered 2023-05-14: 50 ug via INTRAVENOUS
  Filled 2023-05-14: qty 1

## 2023-05-14 MED ORDER — APIXABAN 5 MG PO TABS
5.0000 mg | ORAL_TABLET | Freq: Two times a day (BID) | ORAL | Status: DC
  Administered 2023-05-14 – 2023-05-15 (×3): 5 mg via ORAL
  Filled 2023-05-14 (×3): qty 1

## 2023-05-14 MED ORDER — ONDANSETRON HCL 4 MG/2ML IJ SOLN: 4.0000 mg | Freq: Four times a day (QID) | INTRAMUSCULAR | Status: DC | PRN

## 2023-05-14 MED ORDER — OXYCODONE HCL 5 MG PO TABS
5.0000 mg | ORAL_TABLET | ORAL | Status: DC | PRN
  Administered 2023-05-14 – 2023-05-15 (×3): 5 mg via ORAL
  Filled 2023-05-14 (×3): qty 1

## 2023-05-14 MED ORDER — LIDOCAINE-EPINEPHRINE (PF) 2 %-1:200000 IJ SOLN
10.0000 mL | Freq: Once | INTRAMUSCULAR | Status: AC
  Administered 2023-05-14: 10 mL

## 2023-05-14 MED ORDER — OXYCODONE-ACETAMINOPHEN 5-325 MG PO TABS
2.0000 | ORAL_TABLET | Freq: Once | ORAL | Status: AC
  Administered 2023-05-14: 2 via ORAL
  Filled 2023-05-14: qty 2

## 2023-05-14 MED ORDER — UMECLIDINIUM-VILANTEROL 62.5-25 MCG/ACT IN AEPB
1.0000 | INHALATION_SPRAY | Freq: Every day | RESPIRATORY_TRACT | Status: DC
  Administered 2023-05-15: 1 via RESPIRATORY_TRACT
  Filled 2023-05-14: qty 14

## 2023-05-14 MED ORDER — TAMSULOSIN HCL 0.4 MG PO CAPS
0.4000 mg | ORAL_CAPSULE | Freq: Every day | ORAL | Status: DC
  Administered 2023-05-14: 0.4 mg via ORAL
  Filled 2023-05-14: qty 1

## 2023-05-14 MED ORDER — ACETAMINOPHEN 325 MG PO TABS: 650.0000 mg | ORAL_TABLET | Freq: Four times a day (QID) | ORAL | Status: DC | PRN

## 2023-05-14 MED ORDER — IPRATROPIUM-ALBUTEROL 0.5-2.5 (3) MG/3ML IN SOLN
3.0000 mL | Freq: Four times a day (QID) | RESPIRATORY_TRACT | Status: DC | PRN
  Administered 2023-05-15: 3 mL via RESPIRATORY_TRACT
  Filled 2023-05-14: qty 3

## 2023-05-14 MED ORDER — CARISOPRODOL 350 MG PO TABS
350.0000 mg | ORAL_TABLET | Freq: Once | ORAL | Status: AC | PRN
  Administered 2023-05-14: 350 mg via ORAL
  Filled 2023-05-14: qty 1

## 2023-05-14 NOTE — Evaluation (Addendum)
Physical Therapy Evaluation Patient Details Name: [name] MRN: [id number] DOB: 02-10-1956 Today's Date: 05/14/2023  History of Present Illness  Pt is a 67 y.o. M who presents 05/14/2023 after a fall. Bilateral knee laceration repaired in ED. CT cervical spine showed acute nondisplaced fracture of the superior articular facet of C7.  Neurosurgery consulted and recommended neck collar and outpatient follow-up. X-ray right shoulder, bilateral knees, pelvis negative for acute fracture. Significant PMH:  systolic CHF/ICD, A-fib on Eliquis, COPD, OSA, anxiety, depression, morbid obesity, recurrent falls and chronic pain.  Clinical Impression  PTA, pt lives at home with his girlfriend, uses a cane for ambulation and is independent with ADL's. Pt reports recurrent falls. On PT evaluation, pt reports neck and R shoulder pain. RUE ROM WFL. Displays increased R lateral cervical flexion at rest. Pt requiring moderate assist for bed mobility/transfers and taking pivotal steps from bed to chair using cane. Pt displays functional weakness, impaired standing balance and decreased activity tolerance. Currently recommending continued inpatient follow up therapy, <3 hours/day. Pt has potential to progress home if pain control/management and mobility improve during acute stay. Will continue to assess.       If plan is discharge home, recommend the following: A lot of help with walking and/or transfers;A lot of help with bathing/dressing/bathroom   Can travel by private vehicle   No    Equipment Recommendations BSC/3in1 (wide)  Recommendations for Other Services       Functional Status Assessment Patient has had a recent decline in their functional status and demonstrates the ability to make significant improvements in function in a reasonable and predictable amount of time.     Precautions / Restrictions Precautions Precautions: Fall;Cervical Precaution Booklet Issued: No Required Braces or Orthoses:  Cervical Brace Cervical Brace: Hard collar;At all times Restrictions Weight Bearing Restrictions: No      Mobility  Bed Mobility Overal bed mobility: Needs Assistance Bed Mobility: Supine to Sit     Supine to sit: Mod assist     General bed mobility comments: Typically sleeps in a recliner. + use of bed rail, modA to elevate trunk to sitting position and scoot R hip out to edge of bed using bed pad    Transfers Overall transfer level: Needs assistance Equipment used: Straight cane Transfers: Sit to/from Stand Sit to Stand: Mod assist           General transfer comment: ModA to rise to stand, use of momentum, several attempts to successfully transition    Ambulation/Gait Ambulation/Gait assistance: Min guard Gait Distance (Feet): 3 Feet Assistive device: Straight cane Gait Pattern/deviations: Step-through pattern, Decreased stride length, Wide base of support Gait velocity: decreased     General Gait Details: Utilizing wide BOS, dynamic instability, requiring min guard assist  Stairs            Wheelchair Mobility     Tilt Bed    Modified Rankin (Stroke Patients Only)       Balance Overall balance assessment: Needs assistance Sitting-balance support: Feet supported Sitting balance-Leahy Scale: Fair     Standing balance support: Single extremity supported, During functional activity Standing balance-Leahy Scale: Poor                               Pertinent Vitals/Pain Pain Assessment Pain Assessment: Faces Faces Pain Scale: Hurts whole lot Pain Location: R shoulder, neck Pain Descriptors / Indicators: Discomfort, Guarding, Grimacing, Other (Comment) ("deep") Pain Intervention(s):  Limited activity within patient's tolerance, Monitored during session, Premedicated before session, Patient requesting pain meds-RN notified    Home Living Family/patient expects to be discharged to:: Private residence Living Arrangements: Other  (Comment) (girlfriend) Available Help at Discharge: Family Type of Home: House Home Access: Stairs to enter   [employer] of Steps: 1 (4 inch step)   Home Layout: One level Home Equipment: Pharmacist, hospital (2 wheels);Cane - single point      Prior Function Prior Level of Function : Independent/Modified Independent;Driving             Mobility Comments: uses cane ADLs Comments: girlfriend assists with IADL's     Hand Dominance        Extremity/Trunk Assessment   Upper Extremity Assessment Upper Extremity Assessment: RUE deficits/detail;LUE deficits/detail RUE Deficits / Details: ROM WFL LUE Deficits / Details: ROM WFL    Lower Extremity Assessment Lower Extremity Assessment: RLE deficits/detail;LLE deficits/detail RLE Deficits / Details: Grossly 4+/5 LLE Deficits / Details: Grossly 4+/5    Cervical / Trunk Assessment Cervical / Trunk Assessment: Other exceptions Cervical / Trunk Exceptions: C7 fx. Increased right lateral flexion  Communication   Communication: No difficulties  Cognition Arousal/Alertness: Awake/alert Behavior During Therapy: WFL for tasks assessed/performed Overall Cognitive Status: Within Functional Limits for tasks assessed                                          General Comments      Exercises     Assessment/Plan    PT Assessment Patient needs continued PT services  PT Problem List Decreased strength;Decreased activity tolerance;Decreased balance;Decreased mobility;Pain       PT Treatment Interventions DME instruction;Gait training;Functional mobility training;Therapeutic activities;Therapeutic exercise;Balance training;Patient/family education    PT Goals (Current goals can be found in the Care Plan section)  Acute Rehab PT Goals Patient Stated Goal: less pain PT Goal Formulation: With patient Time For Goal Achievement: 05/28/23 Potential to Achieve Goals: Good    Frequency Min  1X/week     Co-evaluation               AM-PAC PT "6 Clicks" Mobility  Outcome Measure Help needed turning from your back to your side while in a flat bed without using bedrails?: A Lot Help needed moving from lying on your back to sitting on the side of a flat bed without using bedrails?: A Lot Help needed moving to and from a bed to a chair (including a wheelchair)?: A Lot Help needed standing up from a chair using your arms (e.g., wheelchair or bedside chair)?: A Lot Help needed to walk in hospital room?: A Little Help needed climbing 3-5 steps with a railing? : Total 6 Click Score: 12    End of Session Equipment Utilized During Treatment: Gait belt;Other (comment) (cervical brace) Activity Tolerance: Patient limited by pain Patient left: in chair;with call bell/phone within reach;with chair alarm set Nurse Communication: Mobility status PT Visit Diagnosis: Unsteadiness on feet (R26.81);History of falling (Z91.81);Pain Pain - Right/Left: Right Pain - part of body: Shoulder (neck)    Time: 7829-5621 PT Time Calculation (min) (ACUTE ONLY): 29 min   Charges:   PT Evaluation $PT Eval Moderate Complexity: 1 Mod PT Treatments $Therapeutic Activity: 8-22 mins PT General Charges $$ ACUTE PT VISIT: 1 Visit         Lillia Pauls, PT, DPT Acute Rehabilitation Services Office  [phone]   Norval Morton 05/14/2023, 3:03 PM

## 2023-05-14 NOTE — ED Triage Notes (Addendum)
Pt to ED by EMS from home following a mechanical fall (on thinners), no LOC. Pt arrives with lacerations to both lower extremities, pain to the right shoulder, bruising and bleeding noted to the left side of face. Pt received fentanyl by EMS. Arrives A+O, VSS.

## 2023-05-14 NOTE — ED Provider Notes (Signed)
[place] EMERGENCY DEPARTMENT AT [facility] Provider Note   CSN: [id number] Arrival date & time: 05/14/23  0225     History  Chief Complaint  Patient presents with   [name] is a 67 y.o. male.  Patient presents to the emergency department from home by ambulance after a fall.  Patient had a ground-level fall after losing his balance.  He fell forward, landing on his knees and did hit his head.  No loss of consciousness.  Patient with multiple skin tears and abrasions.  He is complaining of significant pain in the right shoulder.       Home Medications Prior to Admission medications   Medication Sig Start Date End Date Taking? Authorizing Provider  acetaminophen (TYLENOL) 500 MG tablet Take 500-1,000 mg by mouth every 6 (six) hours as needed for mild pain or headache.    [provider]  albuterol (VENTOLIN HFA) 108 (90 Base) MCG/ACT inhaler Inhale 1-2 puffs into the lungs every 6 (six) hours as needed for wheezing or shortness of breath. 02/13/23   Luciano Cutter, MD  ALPRAZolam Prudy Feeler) 0.5 MG tablet TAKE 2 TABLETS (1 MG TOTAL) BY MOUTH EVERY DAY AT BEDTIME 01/01/23   [name], MD  ammonium lactate (AMLACTIN) 12 % cream Apply 1 Application topically as needed for dry skin. 12/25/22   McDonald, Rachelle Hora, DPM  apixaban (ELIQUIS) 5 MG TABS tablet Take 1 tablet (5 mg total) by mouth 2 (two) times daily. 01/24/23   Laurey Morale, MD  augmented betamethasone dipropionate (DIPROLENE-AF) 0.05 % cream Apply 1 application  topically daily as needed (to affected areas- for itching). 10/11/20   [provider]  carisoprodol (SOMA) 350 MG tablet TAKE 1 TABLET BY MOUTH THREE TIMES A DAY AS NEEDED FOR MUSCLE SPASM 05/06/23   [name], MD  carvedilol (COREG) 6.25 MG tablet TAKE 1 TABLET BY MOUTH TWICE A DAY 09/24/22   Laurey Morale, MD  Cholecalciferol (VITAMIN D3) 1000 units CAPS Take 1,000 Units by mouth daily.    [provider]   clotrimazole-betamethasone (LOTRISONE) cream Apply 1 Application topically daily. 02/22/23   Henson, Vickie L, NP-C  doxepin (SINEQUAN) 10 MG capsule TAKE 2 CAPSULES BY MOUTH AT BEDTIME AS NEEDED. 02/22/23   [name], MD  DULoxetine (CYMBALTA) 60 MG capsule Take 60 mg by mouth daily.    [provider]  Dupilumab (DUPIXENT) 300 MG/2ML SOPN Inject 300 mg into the skin every 14 (fourteen) days.    [provider]  eplerenone (INSPRA) 25 MG tablet Take 1 tablet (25 mg total) by mouth daily. 12/18/22   Jacklynn Ganong, FNP  erythromycin ophthalmic ointment Place 1 Application into the right eye 3 (three) times daily. 04/24/23   [name], MD  Evolocumab (REPATHA SURECLICK) 140 MG/ML SOAJ Inject 140 mg into the skin every 14 (fourteen) days. 01/24/23   Laurey Morale, MD  fluconazole (DIFLUCAN) 100 MG tablet Take 1 tablet (100 mg total) by mouth daily. Patient not taking: Reported on 03/29/2023 03/01/23   [name], MD  fluticasone [facility]) 50 MCG/ACT nasal spray PLACE 2 SPRAYS INTO BOTH NOSTRILS DAILY AS NEEDED FOR ALLERGIES OR RHINITIS. 02/22/23   [name], MD  fluticasone-salmeterol [facility]) 250-50 MCG/ACT AEPB Inhale 1 puff into the lungs in the morning and at bedtime. 02/13/23   Luciano Cutter, MD  hydrOXYzine (ATARAX) 10 MG tablet TAKE 1 TABLET BY MOUTH  3 TIMES DAILY AS NEEDED FOR ITCHING. Strength: 10 mg 12/28/22   [name], MD  ipratropium-albuterol (DUONEB) 0.5-2.5 (3) MG/3ML SOLN TAKE 3 ML BY NEBULIZATION EVERY 4 HOURS AS NEEDED (WHEEZING). 01/29/23   [name], MD  ketoconazole (NIZORAL) 2 % cream Apply 1 Application topically daily. 09/26/22   Plotnikov, Georgina Quint, MD  KLOR-CON M20 20 MEQ tablet TAKE 1 TABLET BY MOUTH EVERY DAY 09/27/22   Laurey Morale, MD  losartan (COZAAR) 25 MG tablet Take 1 tablet (25 mg total) by mouth daily. 01/17/23   Robbie Lis M, PA-C  omeprazole (PRILOSEC) 20 MG capsule TAKE 1 CAPSULE BY MOUTH TWICE A DAY  02/22/23   [name], MD  ondansetron (ZOFRAN) 4 MG tablet Take 1 tablet (4 mg total) by mouth every 6 (six) hours as needed for nausea. 12/28/22   [name], MD  predniSONE (DELTASONE) 10 MG tablet TAKE 1 TABLET (10 MG TOTAL) BY MOUTH DAILY WITH BREAKFAST. 05/02/23   Judi Saa, DO  promethazine (PHENERGAN) 25 MG tablet TAKE 1 TABLET BY MOUTH EVERY 8 HOURS AS NEEDED FOR NAUSEA OR VOMITING. 04/11/23   [name], MD  Respiratory Therapy Supplies (FULL KIT NEBULIZER SET) MISC 1 each by Does not apply route as needed. 09/24/22   Garnette Gunner, MD  Semaglutide,0.25 or 0.5MG /DOS, 2 MG/3ML SOPN Inject 0.25 mg once weekly for 4 weeks then increase to 0.5 mg weekly for 6 weeks 03/21/23   Laurey Morale, MD  silver sulfADIAZINE (SILVADENE) 1 % cream APPLY TOPICALLY TO AFFECTED AREA EVERY DAY 02/25/23   [name], MD  tamsulosin (FLOMAX) 0.4 MG CAPS capsule TAKE 1 CAPSULE BY MOUTH EVERY DAY 03/12/23   [name], MD  torsemide (DEMADEX) 20 MG tablet Take 60 mg by mouth 2 (two) times daily.    [provider]  traMADol (ULTRAM) 50 MG tablet TAKE 1 TABLET BY MOUTH EVERY DAY AS NEEDED 03/18/23   [name], MD  traZODone (DESYREL) 100 MG tablet TAKE 2 TABLETS BY MOUTH AT BEDTIME AS NEEDED 04/16/23   [name], MD  TUMS 500 MG chewable tablet Chew 500-2,000 mg by mouth every 4 (four) hours as needed for indigestion or heartburn. 10/19/16   [provider]  valACYclovir (VALTREX) 1000 MG tablet TAKE 1 TABLET BY MOUTH THREE TIMES A DAY FOR 7 DAYS 04/24/23   [name], MD  venlafaxine XR (EFFEXOR XR) 150 MG 24 hr capsule Take 1 capsule (150 mg total) by mouth daily with breakfast. 06/29/22   [name], MD  zolpidem (AMBIEN) 10 MG tablet TAKE 1 TABLET BY MOUTH EVERY DAY AT BEDTIME AS NEEDED 03/13/23   [name], MD      Allergies    Tape, Amiodarone, Statins, and Wound dressing adhesive    Review of Systems   Review of Systems  Physical Exam Updated Vital  Signs BP 105/79   Pulse 72   Resp 18   SpO2 96%  Physical Exam Vitals and nursing note reviewed.  Constitutional:      General: He is not in acute distress.    Appearance: He is well-developed.  HENT:     Head: Normocephalic. Contusion present.      Mouth/Throat:     Mouth: Mucous membranes are moist.  Eyes:     General: Vision grossly intact. Gaze aligned appropriately.     Extraocular Movements: Extraocular movements intact.     Conjunctiva/sclera:  Conjunctivae normal.  Cardiovascular:     Rate and Rhythm: Normal rate and regular rhythm.     Pulses: Normal pulses.     Heart sounds: Normal heart sounds, S1 normal and S2 normal. No murmur heard.    No friction rub. No gallop.  Pulmonary:     Effort: Pulmonary effort is normal. No respiratory distress.     Breath sounds: Normal breath sounds.  Abdominal:     Palpations: Abdomen is soft.     Tenderness: There is no abdominal tenderness. There is no guarding or rebound.     Hernia: No hernia is present.  Musculoskeletal:        General: No swelling.     Right shoulder: Tenderness present. No swelling or deformity. Decreased range of motion.     Cervical back: Full passive range of motion without pain, normal range of motion and neck supple. No pain with movement, spinous process tenderness or muscular tenderness. Normal range of motion.     Right knee: Laceration present. No deformity.     Left knee: Laceration present. No deformity.     Right lower leg: No edema.     Left lower leg: Laceration present. No edema.  Skin:    General: Skin is warm and dry.     Capillary Refill: Capillary refill takes less than 2 seconds.     Findings: No ecchymosis, erythema, lesion or wound.  Neurological:     Mental Status: He is alert and oriented to person, place, and time.     GCS: GCS eye subscore is 4. GCS verbal subscore is 5. GCS motor subscore is 6.     Cranial Nerves: Cranial nerves 2-12 are intact.     Sensory: Sensation is  intact.     Motor: Motor function is intact. No weakness or abnormal muscle tone.     Coordination: Coordination is intact.  Psychiatric:        Mood and Affect: Mood normal.        Speech: Speech normal.        Behavior: Behavior normal.     ED Results / Procedures / Treatments   Labs (all labs ordered are listed, but only abnormal results are displayed) Labs Reviewed  CBC WITH DIFFERENTIAL/PLATELET - Abnormal; Notable for the following components:      Result Value   WBC 11.7 (*)    RBC 3.71 (*)    Hemoglobin 12.1 (*)    HCT 38.5 (*)    MCV 103.8 (*)    Neutro Abs 8.7 (*)    Monocytes Absolute 1.1 (*)    Abs Immature Granulocytes 0.18 (*)    All other components within normal limits  COMPREHENSIVE METABOLIC PANEL - Abnormal; Notable for the following components:   Glucose, Bld 113 (*)    BUN 37 (*)    Calcium 8.5 (*)    Total Protein 5.6 (*)    Albumin 3.1 (*)    Total Bilirubin 0.2 (*)    All other components within normal limits  PROTIME-INR    EKG None  Radiology CT CERVICAL SPINE WO CONTRAST  Result Date: 05/14/2023 CLINICAL DATA:  Fall EXAM: CT CERVICAL SPINE WITHOUT CONTRAST TECHNIQUE: Multidetector CT imaging of the cervical spine was performed without intravenous contrast. Multiplanar CT image reconstructions were also generated. RADIATION DOSE REDUCTION: This exam was performed according to the departmental dose-optimization program which includes automated exposure control, adjustment of the mA and/or kV according to patient size and/or use of iterative  reconstruction technique. COMPARISON:  None Available. FINDINGS: Alignment: No static subluxation. Facets are aligned. Occipital condyles and the lateral masses of C1 and C2 are normally approximated. Skull base and vertebrae: Nondisplaced fracture of the superior articular facet of C7 (series 8, image 35). Soft tissues and spinal canal: No prevertebral fluid or swelling. No visible canal hematoma. Disc levels: No  advanced spinal canal or neural foraminal stenosis. Upper chest: No pneumothorax, pulmonary nodule or pleural effusion. Other: Normal visualized paraspinal cervical soft tissues. IMPRESSION: Acute nondisplaced fracture of the superior articular facet of C7. (AO Spine C7 F1) Electronically Signed   By: Deatra Robinson M.D.   On: 05/14/2023 03:35   CT HEAD WO CONTRAST ( )  Result Date: 05/14/2023 CLINICAL DATA:  Head trauma, minor (Age >= 65y) EXAM: CT HEAD WITHOUT CONTRAST TECHNIQUE: Contiguous axial images were obtained from the base of the skull through the vertex without intravenous contrast. RADIATION DOSE REDUCTION: This exam was performed according to the departmental dose-optimization program which includes automated exposure control, adjustment of the mA and/or kV according to patient size and/or use of iterative reconstruction technique. COMPARISON:  MRI head 09/25/2012 FINDINGS: Brain: No evidence of large-territorial acute infarction. No parenchymal hemorrhage. No mass lesion. No extra-axial collection. No mass effect or midline shift. No hydrocephalus. Basilar cisterns are patent. Vascular: No hyperdense vessel. Skull: No acute fracture or focal lesion. Sinuses/Orbits: Left sphenoid and right maxillary sinus mucosal thickening. Paranasal sinuses and mastoid air cells are clear. Bilateral lens replacement. Otherwise the orbits are unremarkable. Other: None. IMPRESSION: No acute intracranial abnormality. Electronically Signed   By: Tish Frederickson M.D.   On: 05/14/2023 03:26   DG Shoulder Right  Result Date: 05/14/2023 CLINICAL DATA:  fall EXAM: RIGHT SHOULDER - 2+ VIEW COMPARISON:  X-ray right shoulder 08/23/2020 FINDINGS: There is no evidence of fracture or dislocation. There is no evidence of arthropathy or other focal bone abnormality. Soft tissues are unremarkable. IMPRESSION: Negative. Electronically Signed   By: Tish Frederickson M.D.   On: 05/14/2023 03:23   DG Knee Left Port  Result Date:  05/14/2023 CLINICAL DATA:  fall EXAM: PORTABLE LEFT KNEE - 1-2 VIEW COMPARISON:  X-ray left knee 06/13/2022 FINDINGS: Total knee arthroplasty. No no evidence of fracture, dislocation, or joint effusion. No evidence of arthropathy or other focal bone abnormality. Soft tissues are unremarkable. IMPRESSION: 1. Total knee arthroplasty. 2. No acute displaced fracture or dislocation. Electronically Signed   By: Tish Frederickson M.D.   On: 05/14/2023 03:22   DG Pelvis Portable  Result Date: 05/14/2023 CLINICAL DATA:  fall EXAM: PORTABLE PELVIS 1-2 VIEWS COMPARISON:  None Available. FINDINGS: There is no evidence of pelvic fracture or diastasis. No acute displaced fracture or dislocation of either hip spine. No pelvic bone lesions are seen. Vascular calcifications. IMPRESSION: Negative for acute traumatic injury. Electronically Signed   By: Tish Frederickson M.D.   On: 05/14/2023 03:20   DG Chest Port 1 View  Result Date: 05/14/2023 CLINICAL DATA:  Trauma level 2 Fall on thinners lacerations to both lower extremities, pain to the right shoulder EXAM: PORTABLE CHEST 1 VIEW COMPARISON:  Chest x-ray 01/25/2023 FINDINGS: Left chest wall dual lead pacemaker defibrillator. The heart and mediastinal contours are unchanged. No focal consolidation. No pulmonary edema. At least trace left pleural effusion. No pneumothorax. No acute osseous abnormality. IMPRESSION: At least trace left pleural effusion. Electronically Signed   By: Tish Frederickson M.D.   On: 05/14/2023 03:19   DG Knee Right Port  Result Date:  05/14/2023 CLINICAL DATA:  fall EXAM: PORTABLE RIGHT KNEE - 1-2 VIEW COMPARISON:  X-ray right knee 11/20/2022 FINDINGS: Total knee arthroplasty. No evidence of fracture, dislocation, or joint effusion. No evidence of arthropathy or other focal bone abnormality. Soft tissues are unremarkable. Vascular calcifications. IMPRESSION: 1. Total knee arthroplasty. 2.  No acute displaced fracture or dislocation. Electronically  Signed   By: Tish Frederickson M.D.   On: 05/14/2023 03:17    Procedures .Marland KitchenLaceration Repair  Date/Time: 05/14/2023 5:42 AM  Performed by: Gilda Crease, MD Authorized by: Gilda Crease, MD   Consent:    Consent obtained:  Verbal   Consent given by:  Patient   Risks discussed:  Infection, pain and poor wound healing Universal protocol:    Procedure explained and questions answered to patient or proxy's satisfaction: yes     Relevant documents present and verified: yes     Test results available: yes     Imaging studies available: yes     Required blood products, implants, devices, and special equipment available: yes     Site/side marked: yes     Immediately prior to procedure, a time out was called: yes     Patient identity confirmed:  Verbally with patient Anesthesia:    Anesthesia method:  Local infiltration   Local anesthetic:  Lidocaine 2% WITH epi Laceration details:    Location:  Leg   Leg location:  R knee   Length (cm):  8 Pre-procedure details:    Preparation:  Patient was prepped and draped in usual sterile fashion and imaging obtained to evaluate for foreign bodies Exploration:    Hemostasis achieved with:  Epinephrine   Contaminated: no   Treatment:    Area cleansed with:  Povidone-iodine   Amount of cleaning:  Standard   Irrigation solution:  Sterile saline   Irrigation volume:  1000   Irrigation method:  Syringe   Debridement:  Minimal Skin repair:    Repair method:  Sutures   Suture size:  3-0   Suture material:  Prolene   Number of sutures:  11 Approximation:    Approximation:  Close .Marland KitchenLaceration Repair  Date/Time: 05/14/2023 5:52 AM  Performed by: Gilda Crease, MD Authorized by: Gilda Crease, MD   Consent:    Consent obtained:  Verbal   Consent given by:  Patient   Risks, benefits, and alternatives were discussed: yes     Risks discussed:  Infection, pain and poor wound healing Universal protocol:     Procedure explained and questions answered to patient or proxy's satisfaction: yes     Relevant documents present and verified: yes     Test results available: yes     Imaging studies available: yes     Required blood products, implants, devices, and special equipment available: yes     Site/side marked: yes     Immediately prior to procedure, a time out was called: yes     Patient identity confirmed:  Verbally with patient Anesthesia:    Anesthesia method:  Local infiltration   Local anesthetic:  Lidocaine 2% WITH epi Laceration details:    Location:  Leg   Leg location:  L knee   Length (cm):  4 Pre-procedure details:    Preparation:  Patient was prepped and draped in usual sterile fashion and imaging obtained to evaluate for foreign bodies Exploration:    Hemostasis achieved with:  Epinephrine   Contaminated: no   Treatment:    Area cleansed with:  Povidone-iodine   Amount of cleaning:  Standard   Irrigation solution:  Sterile saline   Irrigation volume:  1000   Irrigation method:  Syringe   Debridement:  Minimal Skin repair:    Repair method:  Sutures   Suture size:  3-0   Suture material:  Prolene   Number of sutures:  4 Approximation:    Approximation:  Close Post-procedure details:    Procedure completion:  Tolerated well, no immediate complications     Medications Ordered in ED Medications  lidocaine-EPINEPHrine (XYLOCAINE W/EPI) 2 %-1:200000 (PF) injection 10 mL (10 mLs Infiltration Given 05/14/23 0429)  oxyCODONE-acetaminophen (PERCOCET/ROXICET) 5-325 MG per tablet 2 tablet (2 tablets Oral Given 05/14/23 8119)    ED Course/ Medical Decision Making/ A&P                             Medical Decision Making Amount and/or Complexity of Data Reviewed Labs: ordered. Radiology: ordered.  Risk Prescription drug management.   Presents for evaluation after a fall.  Patient with ground-level fall, lost his balance.  He also fell a couple of days ago as well.   Patient is on Eliquis (P A-fib).  No loss of consciousness.  Patient primarily complaining of pain in the right shoulder.  He also has bilateral knee pain.  X-rays performed at arrival showed normal chest, normal shoulder, normal knees bilaterally.  Patient with large skin tears over the right patellar region, laceration over the left patella.  These were repaired as aligned above.  Suture removal in 10 to 12 days.  Recommend antibiotic coverage as there is a large amount of contused tissue.  Patient with multiple other minor skin tears, local wound care provided.  CT head and cervical spine performed.  No intracranial injury.  Patient with slight ecchymosis of the lower eyelid on the left.  Normal extraocular motion, no pain with eye movement, no blurred or double vision.  No concern for orbital injury.  CT cervical spine shows C7 fracture.  No numbness, tingling, weakness of extremities, no focal deficit.  Discussed with neurosurgery, continuous hard collar.  Patient was anxious to go home but he is having significant pain in the neck with movement.  Will require hospitalization for pain management, physical therapy.  Neurosurgery will follow.        Final Clinical Impression(s) / ED Diagnoses Final diagnoses:  Closed nondisplaced fracture of seventh cervical vertebra, unspecified fracture morphology, initial encounter [facility])    Rx / DC Orders ED Discharge Orders     None         Gilda Crease, MD 05/14/23 [phone]

## 2023-05-14 NOTE — Progress Notes (Signed)
   05/14/23 2044  BiPAP/CPAP/SIPAP  Reason BIPAP/CPAP not in use Non-compliant   Pt. Doesn't wear cpap at home

## 2023-05-14 NOTE — Progress Notes (Addendum)
Cardiology asked to perform device interrogation for episodes of fall thought to be polypharmacy related.  Device interrogation ([employer]) was performed today and I did not show any significant abnormalities, pauses, arrhythmias.  It did however show some metrics that might be suggestive of volume overload.  Cardiology has not been asked to formally evaluate, if there are questions or concerns please reach out to our team.  Results of this device interrogation will be faxed into the chart and also put in the patient's physical chart.

## 2023-05-14 NOTE — ED Notes (Signed)
Pt refused to use to use a urinal insisted on getting up from bed. Pt not steady on his feet. Refused to get back into bed. Pt educated on safety and severity of his injuries.

## 2023-05-14 NOTE — Progress Notes (Signed)
Orthopedic Tech Progress Note Patient Details:  [name] December 10, 1955 [id number]  Ortho Devices Type of Ortho Device: Arm sling Ortho Device/Splint Location: rue Ortho Device/Splint Interventions: Ordered, Application, Adjustment  I applied arm sling Post Interventions Patient Tolerated: Well Instructions Provided: Care of device, Adjustment of device  [name] Post 05/14/2023, 3:18 AM

## 2023-05-14 NOTE — Consult Note (Signed)
Neurosurgery Consultation  Reason for Consult: Cervical fracture Referring Physician: Opyd  CC: right shoulder pain   HPI: This is a 67 y.o. male that presents the ED via EMS after a ground-level fall after losing his balance. He fell forward, landing on his knees and hitting his head / face. His main complaint is right shoulder pain, he denies any cervical pain at this time. No new weakness, numbness, or parasthesias, no recent change in bowel or bladder function. He is maintained on Eliquis for his A. Fib.   ROS: A 14 point ROS was performed and is negative except as noted in the HPI.   PMHx:  Past Medical History:  Diagnosis Date   AICD (automatic cardioverter/defibrillator) present    Anemia    supposed to be taking Vit B but doesn't   ANXIETY    takes Xanax nightly   Arthritis    Asthma    Albuterol prn and Advair daily;also takes Prednisone daily   Cardiomyopathy (HCC)    a. EF 25% TEE July 2013; b. EF normalized 2015;  c. 03/2015 Echo: EF 40-45%, difrf HK, PASP 38 mmHg, Mild MR, sev LAE/RAE.   Chronic constipation    takes OTC stool softener   COPD (chronic obstructive pulmonary disease) (HCC)    "one dr says COPD; one dr says emphysema" (09/18/2017)   COVID-19 12/09/2019   DEPRESSION    takes Zoloft and Doxepin daily   Diverticulitis    DYSKINESIA, ESOPHAGUS    Essential hypertension        FIBROMYALGIA    GERD (gastroesophageal reflux disease)        Glaucoma    HYPERLIPIDEMIA    a. Intolerant to statins.   INSOMNIA    takes Ambien nightly   Myocardial infarction [facility])    a. 2012 Myoview notable for prior infarct;  b. 03/2015 Lexiscan CL: EF 37%, diff HK, small area of inferior infarct from apex to base-->Med Rx.   O2 dependent    "2.5L q hs & prn" (09/18/2017)   Paroxysmal atrial fibrillation (HCC)    a. CHA2DS2VASc = 3--> takes Coumadin;  b. 03/15/2015 Successful TEE/DCCV;  c. 03/2015 recurrent afib, Amio d/c'd in setting of hyperthyroidism.   Peripheral  neuropathy    Pneumonia 12/2016   Rash and other nonspecific skin eruption 04/12/2009   no cause found saw dermatologists x 2 and allergist   SLEEP APNEA, OBSTRUCTIVE    a. doesn't use CPAP   Syncope    a. 03/2015 s/p MDT LINQ.   Type II diabetes mellitus (HCC)        FamHx:  Family History  Problem Relation Age of Onset   COPD Mother    Asthma Mother    Colon polyps Mother    Allergies Mother    Hypothyroidism Mother    Asthma Maternal Grandmother    Colon cancer Neg Hx    SocHx:  reports that he quit smoking about 15 years ago. His smoking use included cigarettes. He started smoking about 45 years ago. He has a 60 pack-year smoking history. He has never used smokeless tobacco. He reports that he does not drink alcohol and does not use drugs.  Exam: Vital signs in last 24 hours: Pulse Rate:  [69-72] 69 (07/30 0531) Resp:  [18-20] 20 (07/30 0531) BP: (97-111)/(59-79) 97/59 (07/30 0531) SpO2:  [92 %-96 %] 94 % (07/30 0531) General: Awake, alert, cooperative, lying in bed in NAD Head: Normocephalic with several lacerations on his face  HEENT: [employer]  collar in place  Pulmonary: breathing room air comfortably, no evidence of increased work of breathing Cardiac: RRR Abdomen: S NT ND Extremities: Warm and well perfused x4, lacerations on the bilateral knees with sutures in place, no discharge or bleeding  Neuro: AOx3, PERRL, EOMI, FS Strength 5/5 x4, SILTx4   Assessment and Plan: 67 y.o. male presented to the ED after a fall. Cervical CT personally reviewed, which shows an acute nondisplaced fracture of the superior articular facet of C7. There is no gapping of the facets or disruption of the anterior anatomy. CTH shows no acute findings, SDH, or SAH.   -no acute neurosurgical intervention indicated at this time -recommend Aspen collar at all times -cleared to work with PT/OT as tolerated -he is stable for discharge from a NSGY standpoint once medically ready  -follow-up as an  outpatient in 4 weeks  -please call with any concerns or questions  Iran Sizer, PA-C 05/14/23 11:39 AM [facility]

## 2023-05-14 NOTE — H&P (Signed)
History and Physical    [name] VHQ:[id number] DOB: 1956-04-08 DOA: 05/14/2023  PCP: [name], MD Patient coming from: Home.  Lives with his girlfriend.  Uses cane at baseline.  Chief Complaint: Fall  HPI: [name] is a 67 year old M with PMH of systolic CHF/ICD, A-fib on Eliquis, COPD, OSA, anxiety, depression, morbid obesity, recurrent falls and chronic pain presenting with right shoulder pain after fall at home.  Patient reports getting up about 1 AM to put some water in the fridge when he suddenly fell on his knees and and hit his head on the left side.  He denies any prodrome leading to this.  Denies feeling lightheaded, chest pain or shortness of breath.  Denies loss of consciousness.  He fell right shoulder pain after fall.  He has chronic shoulder pain for which she gets steroid injection.  He also bruised his knees bilaterally.  He also noted bruising around left eye.  He denies vision change.  He denies headache or focal weakness.  Feels some numbness in his fingers on the right side.  He reports another fall 2 days ago when he sustained a left shin laceration.  He reports recurrent fall.  He denies fever, chills, nausea, vomiting, abdominal pain, dysuria, frequency or urgency.  However, he reports difficulty voiding when he is lying in bed.  He says he has to get up and get on commode to be able to pee.  He has a history of BPH.  He takes Flomax.  Patient lives with his girlfriend.  Denies smoking cigarette, drinking alcohol or recreational drug use.  Uses cane for ambulation.  Likes to have cardiopulmonary resuscitation in an event of sudden cardiopulmonary arrest but does not want life prolonging measures.  In ED, stable vitals.  CMP without significant finding.  WBC 11.7.  Hgb 12.1.  Left and right knee x-ray without acute finding.  Bilateral knee laceration repaired in ED.  Chest x-ray with trace left pleural effusion.  Pelvic and right shoulder x-ray without acute  finding.  CT head without acute finding.  CT cervical spine showed acute nondisplaced fracture of the superior articular facet of C7.  Neurosurgery consulted and recommended neck collar and outpatient follow-up.  However, patient has significant pain, and admitted for pain control.   ROS All review of system negative except for pertinent positives and negatives as history of present illness above.  PMH Past Medical History:  Diagnosis Date   AICD (automatic cardioverter/defibrillator) present    Anemia    supposed to be taking Vit B but doesn't   ANXIETY    takes Xanax nightly   Arthritis    Asthma    Albuterol prn and Advair daily;also takes Prednisone daily   Cardiomyopathy (HCC)    a. EF 25% TEE July 2013; b. EF normalized 2015;  c. 03/2015 Echo: EF 40-45%, difrf HK, PASP 38 mmHg, Mild MR, sev LAE/RAE.   Chronic constipation    takes OTC stool softener   COPD (chronic obstructive pulmonary disease) (HCC)    "one dr says COPD; one dr says emphysema" (09/18/2017)   COVID-19 12/09/2019   DEPRESSION    takes Zoloft and Doxepin daily   Diverticulitis    DYSKINESIA, ESOPHAGUS    Essential hypertension        FIBROMYALGIA    GERD (gastroesophageal reflux disease)        Glaucoma    HYPERLIPIDEMIA    a. Intolerant to statins.   INSOMNIA  takes Ambien nightly   Myocardial infarction [facility])    a. 2012 Myoview notable for prior infarct;  b. 03/2015 Lexiscan CL: EF 37%, diff HK, small area of inferior infarct from apex to base-->Med Rx.   O2 dependent    "2.5L q hs & prn" (09/18/2017)   Paroxysmal atrial fibrillation (HCC)    a. CHA2DS2VASc = 3--> takes Coumadin;  b. 03/15/2015 Successful TEE/DCCV;  c. 03/2015 recurrent afib, Amio d/c'd in setting of hyperthyroidism.   Peripheral neuropathy    Pneumonia 12/2016   Rash and other nonspecific skin eruption 04/12/2009   no cause found saw dermatologists x 2 and allergist   SLEEP APNEA, OBSTRUCTIVE    a. doesn't use CPAP   Syncope     a. 03/2015 s/p MDT LINQ.   Type II diabetes mellitus (HCC)        PSH Past Surgical History:  Procedure Laterality Date   ACNE CYST REMOVAL     2 on back    AV NODE ABLATION N/A 10/25/2017   Procedure: AV NODE ABLATION;  Surgeon: Duke Salvia, MD;  Location: [facility] INVASIVE CV LAB;  Service: Cardiovascular;  Laterality: N/A;   BIV ICD INSERTION CRT-D N/A 09/18/2017   Procedure: BIV ICD INSERTION CRT-D;  Surgeon: Duke Salvia, MD;  Location: [facility] INVASIVE CV LAB;  Service: Cardiovascular;  Laterality: N/A;   CARDIAC CATHETERIZATION N/A 03/21/2016   Procedure: Right/Left Heart Cath and Coronary Angiography;  Surgeon: Laurey Morale, MD;  Location: [facility] INVASIVE CV LAB;  Service: Cardiovascular;  Laterality: N/A;   CARDIOVERSION  04/18/2012   Procedure: CARDIOVERSION;  Surgeon: Pricilla Riffle, MD;  Location: [facility] OR;  Service: Cardiovascular;  Laterality: N/A;   CARDIOVERSION  04/25/2012   Procedure: CARDIOVERSION;  Surgeon: Vesta Mixer, MD;  Location: [facility] ENDOSCOPY;  Service: Cardiovascular;  Laterality: N/A;   CARDIOVERSION  04/25/2012   Procedure: CARDIOVERSION;  Surgeon: Pricilla Riffle, MD;  Location: [facility] OR;  Service: Cardiovascular;  Laterality: N/A;   CARDIOVERSION  05/09/2012   Procedure: CARDIOVERSION;  Surgeon: Tonny Bollman, MD;  Location: [facility] OR;  Service: Cardiovascular;  Laterality: N/A;  changed from crenshaw to cooper by trish/leone-endo   CARDIOVERSION N/A 03/15/2015   Procedure: CARDIOVERSION;  Surgeon: Vesta Mixer, MD;  Location: [facility] ENDOSCOPY;  Service: Cardiovascular;  Laterality: N/A;   COLONOSCOPY     COLONOSCOPY WITH PROPOFOL N/A 10/21/2014   Procedure: COLONOSCOPY WITH PROPOFOL;  Surgeon: Meryl Dare, MD;  Location: [facility] ENDOSCOPY;  Service: Endoscopy;  Laterality: N/A;   EP IMPLANTABLE DEVICE N/A 04/06/2015   Procedure: Loop Recorder Insertion;  Surgeon: Marinus Maw, MD;  Location: [facility] INVASIVE CV LAB;  Service: Cardiovascular;  Laterality: N/A;   ESOPHAGOGASTRODUODENOSCOPY      JOINT REPLACEMENT     LOOP RECORDER REMOVAL N/A 09/18/2017   Procedure: LOOP RECORDER REMOVAL;  Surgeon: Duke Salvia, MD;  Location: [facility] INVASIVE CV LAB;  Service: Cardiovascular;  Laterality: N/A;   RIGHT/LEFT HEART CATH AND CORONARY ANGIOGRAPHY N/A 01/28/2017   Procedure: Right/Left Heart Cath and Coronary Angiography;  Surgeon: Laurey Morale, MD;  Location: [facility] INVASIVE CV LAB;  Service: Cardiovascular;  Laterality: N/A;   TEE WITHOUT CARDIOVERSION  04/25/2012   Procedure: TRANSESOPHAGEAL ECHOCARDIOGRAM (TEE);  Surgeon: Vesta Mixer, MD;  Location: [facility] ENDOSCOPY;  Service: Cardiovascular;  Laterality: N/A;   TEE WITHOUT CARDIOVERSION N/A 03/15/2015   Procedure: TRANSESOPHAGEAL ECHOCARDIOGRAM (TEE);  Surgeon: Vesta Mixer, MD;  Location: [facility] ENDOSCOPY;  Service: Cardiovascular;  Laterality:  N/A;   TONSILLECTOMY AND ADENOIDECTOMY     TOTAL KNEE ARTHROPLASTY Right 06/15/2014   Procedure: TOTAL KNEE ARTHROPLASTY;  Surgeon: Sheral Apley, MD;  Location: [facility] OR;  Service: Orthopedics;  Laterality: Right;   TOTAL KNEE ARTHROPLASTY Left 10/17/2021   Procedure: Left TOTAL KNEE ARTHROPLASTY;  Surgeon: Kathryne Hitch, MD;  Location: [facility] OR;  Service: Orthopedics;  Laterality: Left;   Fam HX Family History  Problem Relation Age of Onset   COPD Mother    Asthma Mother    Colon polyps Mother    Allergies Mother    Hypothyroidism Mother    Asthma Maternal Grandmother    Colon cancer Neg Hx     Social Hx  reports that he quit smoking about 15 years ago. His smoking use included cigarettes. He started smoking about 45 years ago. He has a 60 pack-year smoking history. He has never used smokeless tobacco. He reports that he does not drink alcohol and does not use drugs.  Allergy Allergies  Allergen Reactions   Tape Other (See Comments)    SKIN TEARS VERY, VERY EASILY!!!! Use Paper Tape Only, PLEASE!!!   Amiodarone Other (See Comments)    Caused hyperthyroidism    Statins Other (See  Comments)    Myalgias    Wound Dressing Adhesive Other (See Comments)    Skin Tears Use Paper Tape Only   Home Meds Prior to Admission medications   Medication Sig Start Date End Date Taking? Authorizing Provider  acetaminophen (TYLENOL) 500 MG tablet Take 500-1,000 mg by mouth every 6 (six) hours as needed for mild pain or headache.    [provider]  albuterol (VENTOLIN HFA) 108 (90 Base) MCG/ACT inhaler Inhale 1-2 puffs into the lungs every 6 (six) hours as needed for wheezing or shortness of breath. 02/13/23   Luciano Cutter, MD  ALPRAZolam Prudy Feeler) 0.5 MG tablet TAKE 2 TABLETS (1 MG TOTAL) BY MOUTH EVERY DAY AT BEDTIME 01/01/23   [name], MD  ammonium lactate (AMLACTIN) 12 % cream Apply 1 Application topically as needed for dry skin. 12/25/22   McDonald, Rachelle Hora, DPM  apixaban (ELIQUIS) 5 MG TABS tablet Take 1 tablet (5 mg total) by mouth 2 (two) times daily. 01/24/23   Laurey Morale, MD  augmented betamethasone dipropionate (DIPROLENE-AF) 0.05 % cream Apply 1 application  topically daily as needed (to affected areas- for itching). 10/11/20   [provider]  carisoprodol (SOMA) 350 MG tablet TAKE 1 TABLET BY MOUTH THREE TIMES A DAY AS NEEDED FOR MUSCLE SPASM 05/06/23   [name], MD  carvedilol (COREG) 6.25 MG tablet TAKE 1 TABLET BY MOUTH TWICE A DAY 09/24/22   Laurey Morale, MD  Cholecalciferol (VITAMIN D3) 1000 units CAPS Take 1,000 Units by mouth daily.    [provider]  clotrimazole-betamethasone (LOTRISONE) cream Apply 1 Application topically daily. 02/22/23   Henson, Vickie L, NP-C  doxepin (SINEQUAN) 10 MG capsule TAKE 2 CAPSULES BY MOUTH AT BEDTIME AS NEEDED. 02/22/23   [name], MD  DULoxetine (CYMBALTA) 60 MG capsule Take 60 mg by mouth daily.    [provider]  Dupilumab (DUPIXENT) 300 MG/2ML SOPN Inject 300 mg into the skin every 14 (fourteen) days.    [provider]  eplerenone (INSPRA) 25 MG tablet Take 1  tablet (25 mg total) by mouth daily. 12/18/22   Jacklynn Ganong, FNP  erythromycin ophthalmic ointment Place 1 Application into the right eye 3 (three)  times daily. 04/24/23   [name], MD  Evolocumab (REPATHA SURECLICK) 140 MG/ML SOAJ Inject 140 mg into the skin every 14 (fourteen) days. 01/24/23   Laurey Morale, MD  FARXIGA 10 MG TABS tablet Take 10 mg by mouth every morning. 05/05/23   [provider]  fluconazole (DIFLUCAN) 100 MG tablet Take 1 tablet (100 mg total) by mouth daily. Patient not taking: Reported on 03/29/2023 03/01/23   [name], MD  fluticasone [facility]) 50 MCG/ACT nasal spray PLACE 2 SPRAYS INTO BOTH NOSTRILS DAILY AS NEEDED FOR ALLERGIES OR RHINITIS. 02/22/23   [name], MD  fluticasone-salmeterol [facility] INHUB) 250-50 MCG/ACT AEPB Inhale 1 puff into the lungs in the morning and at bedtime. 02/13/23   Luciano Cutter, MD  hydrOXYzine (ATARAX) 10 MG tablet TAKE 1 TABLET BY MOUTH 3 TIMES DAILY AS NEEDED FOR ITCHING. Strength: 10 mg 12/28/22   [name], MD  ipratropium-albuterol (DUONEB) 0.5-2.5 (3) MG/3ML SOLN TAKE 3 ML BY NEBULIZATION EVERY 4 HOURS AS NEEDED (WHEEZING). 01/29/23   [name], MD  ketoconazole (NIZORAL) 2 % cream Apply 1 Application topically daily. 09/26/22   Plotnikov, Georgina Quint, MD  KLOR-CON M20 20 MEQ tablet TAKE 1 TABLET BY MOUTH EVERY DAY 09/27/22   Laurey Morale, MD  losartan (COZAAR) 25 MG tablet Take 1 tablet (25 mg total) by mouth daily. 01/17/23   Robbie Lis M, PA-C  omeprazole (PRILOSEC) 20 MG capsule TAKE 1 CAPSULE BY MOUTH TWICE A DAY 02/22/23   [name], MD  ondansetron (ZOFRAN) 4 MG tablet Take 1 tablet (4 mg total) by mouth every 6 (six) hours as needed for nausea. 12/28/22   [name], MD  predniSONE (DELTASONE) 10 MG tablet TAKE 1 TABLET (10 MG TOTAL) BY MOUTH DAILY WITH BREAKFAST. 05/02/23   Judi Saa, DO  promethazine (PHENERGAN) 25 MG tablet TAKE 1 TABLET BY MOUTH EVERY 8 HOURS AS NEEDED FOR  NAUSEA OR VOMITING. 04/11/23   [name], MD  Respiratory Therapy Supplies (FULL KIT NEBULIZER SET) MISC 1 each by Does not apply route as needed. 09/24/22   Garnette Gunner, MD  Semaglutide,0.25 or 0.5MG /DOS, 2 MG/3ML SOPN Inject 0.25 mg once weekly for 4 weeks then increase to 0.5 mg weekly for 6 weeks 03/21/23   Laurey Morale, MD  silver sulfADIAZINE (SILVADENE) 1 % cream APPLY TOPICALLY TO AFFECTED AREA EVERY DAY 02/25/23   [name], MD  tamsulosin (FLOMAX) 0.4 MG CAPS capsule TAKE 1 CAPSULE BY MOUTH EVERY DAY 03/12/23   [name], MD  torsemide (DEMADEX) 20 MG tablet Take 60 mg by mouth 2 (two) times daily.    [provider]  traMADol (ULTRAM) 50 MG tablet TAKE 1 TABLET BY MOUTH EVERY DAY AS NEEDED 03/18/23   [name], MD  traZODone (DESYREL) 100 MG tablet TAKE 2 TABLETS BY MOUTH AT BEDTIME AS NEEDED 04/16/23   [name], MD  TUMS 500 MG chewable tablet Chew 500-2,000 mg by mouth every 4 (four) hours as needed for indigestion or heartburn. 10/19/16   [provider]  valACYclovir (VALTREX) 1000 MG tablet TAKE 1 TABLET BY MOUTH THREE TIMES A DAY FOR 7 DAYS 04/24/23   [name], MD  venlafaxine XR (EFFEXOR XR) 150 MG 24 hr capsule Take 1 capsule (150 mg total) by mouth daily with breakfast. 06/29/22   [name], MD  zolpidem (AMBIEN) 10 MG tablet TAKE 1 TABLET  BY MOUTH EVERY DAY AT BEDTIME AS NEEDED 03/13/23   [name], MD    Physical Exam: Vitals:   05/14/23 0315 05/14/23 0430 05/14/23 0515 05/14/23 0531  BP: 106/66 109/64 105/79 (!) 97/59  Pulse: 70 69 72 69  Resp: 18 18 18 20   SpO2: 92% 94% 96% 94%    GENERAL: No acute distress.  Appears well.  HEENT: MMM.  Vision and hearing grossly intact.  Bruise around left eye. NECK: Supple.  No apparent JVD.  RESP:  No IWOB. Good air movement bilaterally. CVS:  RRR. Heart sounds normal.  ABD/GI/GU: Bowel sounds present. Soft. Non tender.  MSK/EXT:  Moves extremities.  Bilateral knee laceration s/p  repair.  Fair range of motion. SKIN: Bilateral knee laceration.  Left shin laceration. NEURO: Awake, alert and oriented appropriately.  No gross deficit.  PSYCH: Calm. Normal affect.   Personally Reviewed Radiological Exams See HPI   Personally Reviewed Labs: CBC: Recent Labs  Lab 05/14/23 0240  WBC 11.7*  NEUTROABS 8.7*  HGB 12.1*  HCT 38.5*  MCV 103.8*  PLT 241   Basic Metabolic Panel: Recent Labs  Lab 05/14/23 0240  NA 138  K 4.2  CL 102  CO2 26  GLUCOSE 113*  BUN 37*  CREATININE 1.23  CALCIUM 8.5*   GFR: CrCl cannot be calculated (Unknown ideal weight.). Liver Function Tests: Recent Labs  Lab 05/14/23 0240  AST 20  ALT 29  ALKPHOS 43  BILITOT 0.2*  PROT 5.6*  ALBUMIN 3.1*   No results for input(s): "LIPASE", "AMYLASE" in the last 168 hours. No results for input(s): "AMMONIA" in the last 168 hours. Coagulation Profile: Recent Labs  Lab 05/14/23 0240  INR 1.0   Cardiac Enzymes: No results for input(s): "CKTOTAL", "CKMB", "CKMBINDEX", "TROPONINI" in the last 168 hours. BNP (last 3 results) No results for input(s): "PROBNP" in the last 8760 hours. HbA1C: No results for input(s): "HGBA1C" in the last 72 hours. CBG: No results for input(s): "GLUCAP" in the last 168 hours. Lipid Profile: No results for input(s): "CHOL", "HDL", "LDLCALC", "TRIG", "CHOLHDL", "LDLDIRECT" in the last 72 hours. Thyroid Function Tests: No results for input(s): "TSH", "T4TOTAL", "FREET4", "T3FREE", "THYROIDAB" in the last 72 hours. Anemia Panel: No results for input(s): "VITAMINB12", "FOLATE", "FERRITIN", "TIBC", "IRON", "RETICCTPCT" in the last 72 hours. Urine analysis:    Component Value Date/Time   COLORURINE YELLOW 01/15/2023 1532   APPEARANCEUR Sl Cloudy (A) 01/15/2023 1532   LABSPEC 1.020 01/15/2023 1532   PHURINE 6.0 01/15/2023 1532   GLUCOSEU 250 (A) 01/15/2023 1532   HGBUR NEGATIVE 01/15/2023 1532   BILIRUBINUR NEGATIVE 01/15/2023 1532   KETONESUR NEGATIVE  01/15/2023 1532   PROTEINUR NEGATIVE 12/09/2019 1251   UROBILINOGEN 0.2 01/15/2023 1532   NITRITE NEGATIVE 01/15/2023 1532   LEUKOCYTESUR TRACE (A) 01/15/2023 1532     Assessment and plan:  Principal Problem:   Closed C7 fracture without spinal cord injury (HCC) Active Problems:   HFrEF (heart failure with reduced ejection fraction) (HCC)   COPD (chronic obstructive pulmonary disease) (HCC)   Essential hypertension   Depression   OSA (obstructive sleep apnea)   Chronic pain syndrome   Benign prostatic hyperplasia   ICD (implantable cardioverter-defibrillator) in place   (HFpEF) heart failure with preserved ejection fraction (HCC)   Rotator cuff arthropathy of both shoulders   Accidental fall at home: No prodrome.  No LOC.  At risk for polypharmacy on multiple sedating medications. Bilateral knee laceration: Repaired in ED.  X-ray without fracture.  Acute nondisplaced fracture of superior articular facet of C7 -Appreciate input by neurosurgery-Aspen collar and outpatient follow-up -Cardiology consult for ICD interrogation -Patient with significant pain.  Multimodal pain control with scheduled Tylenol with as needed oxycodone and IV Dilaudid -PT/OT eval  Chronic systolic CHF/AICD: TTE in 10/2022 with LVEF of 40%, global hypokinesis, indeterminate DD, severe LAE and RAE.  On torsemide and eplerenone.  Difficult to assess fluid status due to habitus but appears euvolemic.  No cardiopulmonary symptoms. -Hold home diuretics and GDMT given soft blood pressure -Strict intake and output, daily weights -Cardiology consult for ICD interrogation  Chronic COPD: Stable. -Continue home inhalers  Paroxysmal A-fib: Rate controlled.  On Coreg and Eliquis at home. -Resume home Coreg if BP allows. -Continue home Eliquis  OSA -CPAP at night  Essential hypertension: Currently soft blood pressure. -Will resume home meds as blood pressure allows  Anxiety/depression: Stable -Resume home meds  after med rec  Chronic pain: -Pain meds as above -Review home meds before discharge given risk for polypharmacy  At risk for polypharmacy -Will review home meds after med rec   BPH -Resume home Flomax -Monitor urine output   DVT prophylaxis: On full dose anticoagulation  Code Status: Full code Family Communication: Updated patient's daughter and son at bedside  Consults called: Neurosurgery, cardiology Admission status: Observation   Almon Hercules MD [facility]  If 7PM-7AM, please contact night-coverage [email or web address]  05/14/2023, 1:27 PM

## 2023-05-14 NOTE — ED Notes (Addendum)
Trauma Response Nurse Documentation   [name] is a 67 y.o. male arriving to [facility] ED via EMS  On Eliquis (apixaban) daily. Trauma was activated as a Level 2 by ED charge RN based on the following trauma criteria Elderly patients > 65 with head trauma on anti-coagulation (excluding ASA).  Patient cleared for CT by Dr. Blinda Leatherwood EDP. Pt transported to CT with trauma response nurse present to monitor. RN remained with the patient throughout their absence from the department for clinical observation.   GCS 15.  History   Past Medical History:  Diagnosis Date   AICD (automatic cardioverter/defibrillator) present    Anemia    supposed to be taking Vit B but doesn't   ANXIETY    takes Xanax nightly   Arthritis    Asthma    Albuterol prn and Advair daily;also takes Prednisone daily   Cardiomyopathy (HCC)    a. EF 25% TEE July 2013; b. EF normalized 2015;  c. 03/2015 Echo: EF 40-45%, difrf HK, PASP 38 mmHg, Mild MR, sev LAE/RAE.   Chronic constipation    takes OTC stool softener   COPD (chronic obstructive pulmonary disease) (HCC)    "one dr says COPD; one dr says emphysema" (09/18/2017)   COVID-19 12/09/2019   DEPRESSION    takes Zoloft and Doxepin daily   Diverticulitis    DYSKINESIA, ESOPHAGUS    Essential hypertension        FIBROMYALGIA    GERD (gastroesophageal reflux disease)        Glaucoma    HYPERLIPIDEMIA    a. Intolerant to statins.   INSOMNIA    takes Ambien nightly   Myocardial infarction [facility])    a. 2012 Myoview notable for prior infarct;  b. 03/2015 Lexiscan CL: EF 37%, diff HK, small area of inferior infarct from apex to base-->Med Rx.   O2 dependent    "2.5L q hs & prn" (09/18/2017)   Paroxysmal atrial fibrillation (HCC)    a. CHA2DS2VASc = 3--> takes Coumadin;  b. 03/15/2015 Successful TEE/DCCV;  c. 03/2015 recurrent afib, Amio d/c'd in setting of hyperthyroidism.   Peripheral neuropathy    Pneumonia 12/2016   Rash and other nonspecific skin eruption  04/12/2009   no cause found saw dermatologists x 2 and allergist   SLEEP APNEA, OBSTRUCTIVE    a. doesn't use CPAP   Syncope    a. 03/2015 s/p MDT LINQ.   Type II diabetes mellitus (HCC)          Past Surgical History:  Procedure Laterality Date   ACNE CYST REMOVAL     2 on back    AV NODE ABLATION N/A 10/25/2017   Procedure: AV NODE ABLATION;  Surgeon: Duke Salvia, MD;  Location: [facility] INVASIVE CV LAB;  Service: Cardiovascular;  Laterality: N/A;   BIV ICD INSERTION CRT-D N/A 09/18/2017   Procedure: BIV ICD INSERTION CRT-D;  Surgeon: Duke Salvia, MD;  Location: [facility] INVASIVE CV LAB;  Service: Cardiovascular;  Laterality: N/A;   CARDIAC CATHETERIZATION N/A 03/21/2016   Procedure: Right/Left Heart Cath and Coronary Angiography;  Surgeon: Laurey Morale, MD;  Location: [facility] INVASIVE CV LAB;  Service: Cardiovascular;  Laterality: N/A;   CARDIOVERSION  04/18/2012   Procedure: CARDIOVERSION;  Surgeon: Pricilla Riffle, MD;  Location: [facility] OR;  Service: Cardiovascular;  Laterality: N/A;   CARDIOVERSION  04/25/2012   Procedure: CARDIOVERSION;  Surgeon: Vesta Mixer, MD;  Location: [facility] ENDOSCOPY;  Service: Cardiovascular;  Laterality: N/A;  CARDIOVERSION  04/25/2012   Procedure: CARDIOVERSION;  Surgeon: Pricilla Riffle, MD;  Location: [facility] OR;  Service: Cardiovascular;  Laterality: N/A;   CARDIOVERSION  05/09/2012   Procedure: CARDIOVERSION;  Surgeon: Tonny Bollman, MD;  Location: [facility] OR;  Service: Cardiovascular;  Laterality: N/A;  changed from crenshaw to cooper by trish/leone-endo   CARDIOVERSION N/A 03/15/2015   Procedure: CARDIOVERSION;  Surgeon: Vesta Mixer, MD;  Location: [facility] ENDOSCOPY;  Service: Cardiovascular;  Laterality: N/A;   COLONOSCOPY     COLONOSCOPY WITH PROPOFOL N/A 10/21/2014   Procedure: COLONOSCOPY WITH PROPOFOL;  Surgeon: Meryl Dare, MD;  Location: [facility] ENDOSCOPY;  Service: Endoscopy;  Laterality: N/A;   EP IMPLANTABLE DEVICE N/A 04/06/2015   Procedure: Loop Recorder Insertion;   Surgeon: Marinus Maw, MD;  Location: [facility] INVASIVE CV LAB;  Service: Cardiovascular;  Laterality: N/A;   ESOPHAGOGASTRODUODENOSCOPY     JOINT REPLACEMENT     LOOP RECORDER REMOVAL N/A 09/18/2017   Procedure: LOOP RECORDER REMOVAL;  Surgeon: Duke Salvia, MD;  Location: [facility] INVASIVE CV LAB;  Service: Cardiovascular;  Laterality: N/A;   RIGHT/LEFT HEART CATH AND CORONARY ANGIOGRAPHY N/A 01/28/2017   Procedure: Right/Left Heart Cath and Coronary Angiography;  Surgeon: Laurey Morale, MD;  Location: [facility] INVASIVE CV LAB;  Service: Cardiovascular;  Laterality: N/A;   TEE WITHOUT CARDIOVERSION  04/25/2012   Procedure: TRANSESOPHAGEAL ECHOCARDIOGRAM (TEE);  Surgeon: Vesta Mixer, MD;  Location: [facility] ENDOSCOPY;  Service: Cardiovascular;  Laterality: N/A;   TEE WITHOUT CARDIOVERSION N/A 03/15/2015   Procedure: TRANSESOPHAGEAL ECHOCARDIOGRAM (TEE);  Surgeon: Vesta Mixer, MD;  Location: [facility] ENDOSCOPY;  Service: Cardiovascular;  Laterality: N/A;   TONSILLECTOMY AND ADENOIDECTOMY     TOTAL KNEE ARTHROPLASTY Right 06/15/2014   Procedure: TOTAL KNEE ARTHROPLASTY;  Surgeon: Sheral Apley, MD;  Location: [facility] OR;  Service: Orthopedics;  Laterality: Right;   TOTAL KNEE ARTHROPLASTY Left 10/17/2021   Procedure: Left TOTAL KNEE ARTHROPLASTY;  Surgeon: Kathryne Hitch, MD;  Location: [facility] OR;  Service: Orthopedics;  Laterality: Left;       Initial Focused Assessment (If applicable, or please see trauma documentation): Airway patent/unobstructed, BS clear No obvious external hemorrhage, BP 106/82, lacerations to bilateral knees with bleeding controlled GCS 15   Alert/oriented male presents via EMS from home after a ground level fall with head injury, takes eliquis. Bilateral knee lacerations with bleeding controlled, right shoulder pain arrives in sling. Old skin tear to lower left leg.  CT's Completed:   CT Head and CT C-Spine   Interventions:  Trauma lab draw Wound care/lac repair  CT head and  c-spine Portable chest, pelvis, right shoulder, bilateral knees XRAYs TDAP contraindicated, UTD  Plan for disposition:  Admit  Consults completed:  Neurosurgery  Hospitalist  Event Summary: Presents via EMS from home after a ground level fall resulting in right shoulder pain and bilateral knee lacerations. Reports hitting his head, bruising to left side of face. Takes eliquis. Portable XRAYS completed, escorted to CT by TRN. TDAP up to date.   MTP Summary (If applicable): NA  Bedside handoff with ED RN Milagros Loll.    Tal Neer O Nirav Sweda  Trauma Response RN  Please call TRN at [phone] for further assistance.

## 2023-05-14 NOTE — ED Notes (Signed)
ED TO INPATIENT HANDOFF REPORT  ED Nurse Name and Phone #: [phone]  S Name/Age/Gender [name] 67 y.o. male Room/Bed: 021C/021C  Code Status   Code Status: Prior  Home/SNF/Other Home Patient oriented to: self, place, time, and situation Is this baseline? Yes   Triage Complete: Triage complete  Chief Complaint Closed C7 fracture without spinal cord injury [facility]) [S12.690A]  Triage Note Pt to ED by EMS from home following a mechanical fall (on thinners), no LOC. Pt arrives with lacerations to both lower extremities, pain to the right shoulder, bruising and bleeding noted to the left side of face. Pt received fentanyl by EMS. Arrives A+O, VSS.   Allergies Allergies  Allergen Reactions   Tape Other (See Comments)    SKIN TEARS VERY, VERY EASILY!!!! Use Paper Tape Only, PLEASE!!!   Amiodarone Other (See Comments)    Caused hyperthyroidism    Statins Other (See Comments)    Myalgias    Wound Dressing Adhesive Other (See Comments)    Skin Tears Use Paper Tape Only    Level of Care/Admitting Diagnosis ED Disposition     ED Disposition  Admit   Condition  --   Comment  Hospital Area: [facility] [[id number]]  Level of Care: Med-Surg [16]  May place patient in observation at [facility] or [facility] if equivalent level of care is available:: No  Covid Evaluation: Asymptomatic - no recent exposure (last 10 days) testing not required  Diagnosis: Closed C7 fracture without spinal cord injury [facility]) [[id number]]  Admitting Physician: Briscoe Deutscher [[id number]]  Attending Physician: Briscoe Deutscher [[id number]]          B Medical/Surgery History Past Medical History:  Diagnosis Date   AICD (automatic cardioverter/defibrillator) present    Anemia    supposed to be taking Vit B but doesn't   ANXIETY    takes Xanax nightly   Arthritis    Asthma    Albuterol prn and Advair daily;also takes Prednisone daily   Cardiomyopathy (HCC)    a. EF 25% TEE  July 2013; b. EF normalized 2015;  c. 03/2015 Echo: EF 40-45%, difrf HK, PASP 38 mmHg, Mild MR, sev LAE/RAE.   Chronic constipation    takes OTC stool softener   COPD (chronic obstructive pulmonary disease) (HCC)    "one dr says COPD; one dr says emphysema" (09/18/2017)   COVID-19 12/09/2019   DEPRESSION    takes Zoloft and Doxepin daily   Diverticulitis    DYSKINESIA, ESOPHAGUS    Essential hypertension        FIBROMYALGIA    GERD (gastroesophageal reflux disease)        Glaucoma    HYPERLIPIDEMIA    a. Intolerant to statins.   INSOMNIA    takes Ambien nightly   Myocardial infarction [facility])    a. 2012 Myoview notable for prior infarct;  b. 03/2015 Lexiscan CL: EF 37%, diff HK, small area of inferior infarct from apex to base-->Med Rx.   O2 dependent    "2.5L q hs & prn" (09/18/2017)   Paroxysmal atrial fibrillation (HCC)    a. CHA2DS2VASc = 3--> takes Coumadin;  b. 03/15/2015 Successful TEE/DCCV;  c. 03/2015 recurrent afib, Amio d/c'd in setting of hyperthyroidism.   Peripheral neuropathy    Pneumonia 12/2016   Rash and other nonspecific skin eruption 04/12/2009   no cause found saw dermatologists x 2 and allergist   SLEEP APNEA, OBSTRUCTIVE    a. doesn't use CPAP  Syncope    a. 03/2015 s/p MDT LINQ.   Type II diabetes mellitus (HCC)        Past Surgical History:  Procedure Laterality Date   ACNE CYST REMOVAL     2 on back    AV NODE ABLATION N/A 10/25/2017   Procedure: AV NODE ABLATION;  Surgeon: Duke Salvia, MD;  Location: [facility] INVASIVE CV LAB;  Service: Cardiovascular;  Laterality: N/A;   BIV ICD INSERTION CRT-D N/A 09/18/2017   Procedure: BIV ICD INSERTION CRT-D;  Surgeon: Duke Salvia, MD;  Location: [facility] INVASIVE CV LAB;  Service: Cardiovascular;  Laterality: N/A;   CARDIAC CATHETERIZATION N/A 03/21/2016   Procedure: Right/Left Heart Cath and Coronary Angiography;  Surgeon: Laurey Morale, MD;  Location: [facility] INVASIVE CV LAB;  Service: Cardiovascular;  Laterality: N/A;    CARDIOVERSION  04/18/2012   Procedure: CARDIOVERSION;  Surgeon: Pricilla Riffle, MD;  Location: [facility] OR;  Service: Cardiovascular;  Laterality: N/A;   CARDIOVERSION  04/25/2012   Procedure: CARDIOVERSION;  Surgeon: Vesta Mixer, MD;  Location: [facility] ENDOSCOPY;  Service: Cardiovascular;  Laterality: N/A;   CARDIOVERSION  04/25/2012   Procedure: CARDIOVERSION;  Surgeon: Pricilla Riffle, MD;  Location: [facility] OR;  Service: Cardiovascular;  Laterality: N/A;   CARDIOVERSION  05/09/2012   Procedure: CARDIOVERSION;  Surgeon: Tonny Bollman, MD;  Location: [facility] OR;  Service: Cardiovascular;  Laterality: N/A;  changed from crenshaw to cooper by trish/leone-endo   CARDIOVERSION N/A 03/15/2015   Procedure: CARDIOVERSION;  Surgeon: Vesta Mixer, MD;  Location: [facility] ENDOSCOPY;  Service: Cardiovascular;  Laterality: N/A;   COLONOSCOPY     COLONOSCOPY WITH PROPOFOL N/A 10/21/2014   Procedure: COLONOSCOPY WITH PROPOFOL;  Surgeon: Meryl Dare, MD;  Location: [facility] ENDOSCOPY;  Service: Endoscopy;  Laterality: N/A;   EP IMPLANTABLE DEVICE N/A 04/06/2015   Procedure: Loop Recorder Insertion;  Surgeon: Marinus Maw, MD;  Location: [facility] INVASIVE CV LAB;  Service: Cardiovascular;  Laterality: N/A;   ESOPHAGOGASTRODUODENOSCOPY     JOINT REPLACEMENT     LOOP RECORDER REMOVAL N/A 09/18/2017   Procedure: LOOP RECORDER REMOVAL;  Surgeon: Duke Salvia, MD;  Location: [facility] INVASIVE CV LAB;  Service: Cardiovascular;  Laterality: N/A;   RIGHT/LEFT HEART CATH AND CORONARY ANGIOGRAPHY N/A 01/28/2017   Procedure: Right/Left Heart Cath and Coronary Angiography;  Surgeon: Laurey Morale, MD;  Location: [facility] INVASIVE CV LAB;  Service: Cardiovascular;  Laterality: N/A;   TEE WITHOUT CARDIOVERSION  04/25/2012   Procedure: TRANSESOPHAGEAL ECHOCARDIOGRAM (TEE);  Surgeon: Vesta Mixer, MD;  Location: [facility] ENDOSCOPY;  Service: Cardiovascular;  Laterality: N/A;   TEE WITHOUT CARDIOVERSION N/A 03/15/2015   Procedure: TRANSESOPHAGEAL ECHOCARDIOGRAM (TEE);   Surgeon: Vesta Mixer, MD;  Location: [facility] ENDOSCOPY;  Service: Cardiovascular;  Laterality: N/A;   TONSILLECTOMY AND ADENOIDECTOMY     TOTAL KNEE ARTHROPLASTY Right 06/15/2014   Procedure: TOTAL KNEE ARTHROPLASTY;  Surgeon: Sheral Apley, MD;  Location: [facility] OR;  Service: Orthopedics;  Laterality: Right;   TOTAL KNEE ARTHROPLASTY Left 10/17/2021   Procedure: Left TOTAL KNEE ARTHROPLASTY;  Surgeon: Kathryne Hitch, MD;  Location: [facility] OR;  Service: Orthopedics;  Laterality: Left;     A IV Location/Drains/Wounds Patient Lines/Drains/Airways Status     Active Line/Drains/Airways     Name Placement date Placement time Site Days   Peripheral IV 05/14/23 20 G Left Forearm 05/14/23  0230  Forearm  less than 1   Wound / Incision (Open or Dehisced) Non-pressure wound Pretibial Right;Lower --  --  Pretibial  --            Intake/Output Last 24 hours No intake or output data in the 24 hours ending 05/14/23 0737  Labs/Imaging Results for orders placed or performed during the hospital encounter of 05/14/23 (from the past 48 hour(s))  CBC with Differential/Platelet     Status: Abnormal   Collection Time: 05/14/23  2:40 AM  Result Value Ref Range   WBC 11.7 (H) 4.0 - 10.5 K/uL   RBC 3.71 (L) 4.22 - 5.81 MIL/uL   Hemoglobin 12.1 (L) 13.0 - 17.0 g/dL   HCT 44.0 (L) 10.2 - 72.5 %   MCV 103.8 (H) 80.0 - 100.0 fL   MCH 32.6 26.0 - 34.0 pg   MCHC 31.4 30.0 - 36.0 g/dL   RDW 36.6 44.0 - 34.7 %   Platelets 241 150 - 400 K/uL   nRBC 0.0 0.0 - 0.2 %   Neutrophils Relative % 74 %   Neutro Abs 8.7 (H) 1.7 - 7.7 K/uL   Lymphocytes Relative 14 %   Lymphs Abs 1.6 0.7 - 4.0 K/uL   Monocytes Relative 10 %   Monocytes Absolute 1.1 (H) 0.1 - 1.0 K/uL   Eosinophils Relative 0 %   Eosinophils Absolute 0.0 0.0 - 0.5 K/uL   Basophils Relative 0 %   Basophils Absolute 0.0 0.0 - 0.1 K/uL   Immature Granulocytes 2 %   Abs Immature Granulocytes 0.18 (H) 0.00 - 0.07 K/uL    Comment: Performed at  [facility] Lab, [address] [place], [place] [address]  Comprehensive metabolic panel     Status: Abnormal   Collection Time: 05/14/23  2:40 AM  Result Value Ref Range   Sodium 138 135 - 145 mmol/L   Potassium 4.2 3.5 - 5.1 mmol/L   Chloride 102 98 - 111 mmol/L   CO2 26 22 - 32 mmol/L   Glucose, Bld 113 (H) 70 - 99 mg/dL    Comment: Glucose reference range applies only to samples taken after fasting for at least 8 hours.   BUN 37 (H) 8 - 23 mg/dL   Creatinine, Ser 6.38 0.61 - 1.24 mg/dL   Calcium 8.5 (L) 8.9 - 10.3 mg/dL   Total Protein 5.6 (L) 6.5 - 8.1 g/dL   Albumin 3.1 (L) 3.5 - 5.0 g/dL   AST 20 15 - 41 U/L   ALT 29 0 - 44 U/L   Alkaline Phosphatase 43 38 - 126 U/L   Total Bilirubin 0.2 (L) 0.3 - 1.2 mg/dL   GFR, Estimated >75 >64 mL/min    Comment: (NOTE) Calculated using the CKD-EPI Creatinine Equation (2021)    Anion gap 10 5 - 15    Comment: Performed at [facility] Lab, [address] [place], [place] [address]  Protime-INR     Status: None   Collection Time: 05/14/23  2:40 AM  Result Value Ref Range   Prothrombin Time 13.2 11.4 - 15.2 seconds   INR 1.0 0.8 - 1.2    Comment: (NOTE) INR goal varies based on device and disease states. Performed at [facility] Lab, [address] [place], [place] [address]    *Note: Due to a large number of results and/or encounters for the requested time period, some results have not been displayed. A complete set of results can be found in Results Review.   CT CERVICAL SPINE WO CONTRAST  Result Date: 05/14/2023 CLINICAL DATA:  Fall EXAM: CT CERVICAL SPINE WITHOUT CONTRAST  TECHNIQUE: Multidetector CT imaging of the cervical spine was performed without intravenous contrast. Multiplanar CT image reconstructions were also generated. RADIATION DOSE REDUCTION: This exam was performed according to the departmental dose-optimization program which includes automated exposure control, adjustment of the mA and/or kV according to  patient size and/or use of iterative reconstruction technique. COMPARISON:  None Available. FINDINGS: Alignment: No static subluxation. Facets are aligned. Occipital condyles and the lateral masses of C1 and C2 are normally approximated. Skull base and vertebrae: Nondisplaced fracture of the superior articular facet of C7 (series 8, image 35). Soft tissues and spinal canal: No prevertebral fluid or swelling. No visible canal hematoma. Disc levels: No advanced spinal canal or neural foraminal stenosis. Upper chest: No pneumothorax, pulmonary nodule or pleural effusion. Other: Normal visualized paraspinal cervical soft tissues. IMPRESSION: Acute nondisplaced fracture of the superior articular facet of C7. (AO Spine C7 F1) Electronically Signed   By: Deatra Robinson M.D.   On: 05/14/2023 03:35   CT HEAD WO CONTRAST ( )  Result Date: 05/14/2023 CLINICAL DATA:  Head trauma, minor (Age >= 65y) EXAM: CT HEAD WITHOUT CONTRAST TECHNIQUE: Contiguous axial images were obtained from the base of the skull through the vertex without intravenous contrast. RADIATION DOSE REDUCTION: This exam was performed according to the departmental dose-optimization program which includes automated exposure control, adjustment of the mA and/or kV according to patient size and/or use of iterative reconstruction technique. COMPARISON:  MRI head 09/25/2012 FINDINGS: Brain: No evidence of large-territorial acute infarction. No parenchymal hemorrhage. No mass lesion. No extra-axial collection. No mass effect or midline shift. No hydrocephalus. Basilar cisterns are patent. Vascular: No hyperdense vessel. Skull: No acute fracture or focal lesion. Sinuses/Orbits: Left sphenoid and right maxillary sinus mucosal thickening. Paranasal sinuses and mastoid air cells are clear. Bilateral lens replacement. Otherwise the orbits are unremarkable. Other: None. IMPRESSION: No acute intracranial abnormality. Electronically Signed   By: Tish Frederickson M.D.    On: 05/14/2023 03:26   DG Shoulder Right  Result Date: 05/14/2023 CLINICAL DATA:  fall EXAM: RIGHT SHOULDER - 2+ VIEW COMPARISON:  X-ray right shoulder 08/23/2020 FINDINGS: There is no evidence of fracture or dislocation. There is no evidence of arthropathy or other focal bone abnormality. Soft tissues are unremarkable. IMPRESSION: Negative. Electronically Signed   By: Tish Frederickson M.D.   On: 05/14/2023 03:23   DG Knee Left Port  Result Date: 05/14/2023 CLINICAL DATA:  fall EXAM: PORTABLE LEFT KNEE - 1-2 VIEW COMPARISON:  X-ray left knee 06/13/2022 FINDINGS: Total knee arthroplasty. No no evidence of fracture, dislocation, or joint effusion. No evidence of arthropathy or other focal bone abnormality. Soft tissues are unremarkable. IMPRESSION: 1. Total knee arthroplasty. 2. No acute displaced fracture or dislocation. Electronically Signed   By: Tish Frederickson M.D.   On: 05/14/2023 03:22   DG Pelvis Portable  Result Date: 05/14/2023 CLINICAL DATA:  fall EXAM: PORTABLE PELVIS 1-2 VIEWS COMPARISON:  None Available. FINDINGS: There is no evidence of pelvic fracture or diastasis. No acute displaced fracture or dislocation of either hip spine. No pelvic bone lesions are seen. Vascular calcifications. IMPRESSION: Negative for acute traumatic injury. Electronically Signed   By: Tish Frederickson M.D.   On: 05/14/2023 03:20   DG Chest Port 1 View  Result Date: 05/14/2023 CLINICAL DATA:  Trauma level 2 Fall on thinners lacerations to both lower extremities, pain to the right shoulder EXAM: PORTABLE CHEST 1 VIEW COMPARISON:  Chest x-ray 01/25/2023 FINDINGS: Left chest wall dual lead pacemaker defibrillator. The heart and  mediastinal contours are unchanged. No focal consolidation. No pulmonary edema. At least trace left pleural effusion. No pneumothorax. No acute osseous abnormality. IMPRESSION: At least trace left pleural effusion. Electronically Signed   By: Tish Frederickson M.D.   On: 05/14/2023 03:19   DG  Knee Right Port  Result Date: 05/14/2023 CLINICAL DATA:  fall EXAM: PORTABLE RIGHT KNEE - 1-2 VIEW COMPARISON:  X-ray right knee 11/20/2022 FINDINGS: Total knee arthroplasty. No evidence of fracture, dislocation, or joint effusion. No evidence of arthropathy or other focal bone abnormality. Soft tissues are unremarkable. Vascular calcifications. IMPRESSION: 1. Total knee arthroplasty. 2.  No acute displaced fracture or dislocation. Electronically Signed   By: Tish Frederickson M.D.   On: 05/14/2023 03:17    Pending Labs Unresulted Labs (From admission, onward)    None       Vitals/Pain Today's Vitals   05/14/23 0315 05/14/23 0430 05/14/23 0515 05/14/23 0531  BP: 106/66 109/64 105/79 (!) 97/59  Pulse: 70 69 72 69  Resp: 18 18 18 20   SpO2: 92% 94% 96% 94%  PainSc:        Isolation Precautions No active isolations  Medications Medications  acetaminophen (TYLENOL) tablet 650 mg (has no administration in time range)  oxyCODONE (Oxy IR/ROXICODONE) immediate release tablet 5 mg (has no administration in time range)  fentaNYL (SUBLIMAZE) injection 25-50 mcg (has no administration in time range)  lidocaine-EPINEPHrine (XYLOCAINE W/EPI) 2 %-1:200000 (PF) injection 10 mL (10 mLs Infiltration Given 05/14/23 0429)  oxyCODONE-acetaminophen (PERCOCET/ROXICET) 5-325 MG per tablet 2 tablet (2 tablets Oral Given 05/14/23 0519)    Mobility walks     Focused Assessments Cardiac Assessment Handoff:    Lab Results  Component Value Date   TROPONINI <0.03 12/31/2016   Lab Results  Component Value Date   DDIMER 3.48 (H) 12/11/2019   Does the Patient currently have chest pain? No    R Recommendations: See Admitting Provider Note  Report given to:   Additional Notes:

## 2023-05-14 NOTE — Consult Note (Addendum)
WOC Nurse Consult Note: Reason for Consult: Consult requested for bilat legs. Pt had full thickness wounds to bilat knees which have been sutured; left knee 3X5cm and right knee 3X7cm.  Left anterior lower calf with full thickness skin tear in a "C' shape, 6X5X.1cm, skin approximated over 70% of the wound, 30% red and moist. Small amt bloody drainage from all 3 sites.  Dressing procedure/placement/frequency: Topical treatment orders provided for bedside nurses to perform as follows: Apply Xeroform gauze to left calf and bilat knees Q day, then cover with foam dressings.  Change foam dressings Q 3 days or PRN soiling.  If they need to be held in place, apply ace wraps.  Pt will need sutures removed in 7-10 days. Please re-consult if further assistance is needed.  Thank-you,  Cammie Mcgee MSN, RN, CWOCN, [place], CNS [phone]

## 2023-05-14 NOTE — Progress Notes (Signed)
Remote ICD transmission.   

## 2023-05-15 DIAGNOSIS — I5020 Unspecified systolic (congestive) heart failure: Secondary | ICD-10-CM | POA: Diagnosis not present

## 2023-05-15 DIAGNOSIS — S12690A Other displaced fracture of seventh cervical vertebra, initial encounter for closed fracture: Secondary | ICD-10-CM | POA: Diagnosis not present

## 2023-05-15 DIAGNOSIS — I5030 Unspecified diastolic (congestive) heart failure: Secondary | ICD-10-CM

## 2023-05-15 DIAGNOSIS — G894 Chronic pain syndrome: Secondary | ICD-10-CM | POA: Diagnosis not present

## 2023-05-15 DIAGNOSIS — N400 Enlarged prostate without lower urinary tract symptoms: Secondary | ICD-10-CM | POA: Diagnosis not present

## 2023-05-15 DIAGNOSIS — I10 Essential (primary) hypertension: Secondary | ICD-10-CM | POA: Diagnosis not present

## 2023-05-15 DIAGNOSIS — Z95810 Presence of automatic (implantable) cardiac defibrillator: Secondary | ICD-10-CM | POA: Diagnosis not present

## 2023-05-15 DIAGNOSIS — J438 Other emphysema: Secondary | ICD-10-CM | POA: Diagnosis not present

## 2023-05-15 DIAGNOSIS — G4733 Obstructive sleep apnea (adult) (pediatric): Secondary | ICD-10-CM | POA: Diagnosis not present

## 2023-05-15 MED ORDER — TORSEMIDE 20 MG PO TABS
60.0000 mg | ORAL_TABLET | Freq: Two times a day (BID) | ORAL | Status: DC
  Administered 2023-05-15: 60 mg via ORAL
  Filled 2023-05-15: qty 3

## 2023-05-15 MED ORDER — CARVEDILOL 3.125 MG PO TABS: 3.1250 mg | ORAL_TABLET | Freq: Two times a day (BID) | ORAL | 1 refills | Status: DC

## 2023-05-15 NOTE — Progress Notes (Signed)
PRN Duoneb administered, lung sounds prior to tx clear & diminished in front lobes, posterior lower lobes w/ expiratory wheezes, pt tolerated tx well, lungs sounds following tx were clear in all lobes.

## 2023-05-15 NOTE — Evaluation (Signed)
Occupational Therapy Evaluation Patient Details Name: [name] MRN: [id number] DOB: Apr 01, 1956 Today's Date: 05/15/2023   History of Present Illness Pt is a 67 y.o. M who presents 05/14/2023 after a fall. Bilateral knee laceration repaired in ED. CT cervical spine showed acute nondisplaced fracture of the superior articular facet of C7.  Neurosurgery consulted and recommended neck collar and outpatient follow-up. X-ray right shoulder, bilateral knees, pelvis negative for acute fracture. Significant PMH:  systolic CHF/ICD, A-fib on Eliquis, COPD, OSA, anxiety, depression, morbid obesity, recurrent falls and chronic pain.   Clinical Impression   Pt admitted for above, PTA he lived with girlfriend and had assist for iADLs and LB ADLs. Pt currently presenting with strong lateral cervical flexion and impaired cervical ROM. Pt ambulating with min guard + SPC and needs assist with LB ADLs but that is baseline. Reinforced cervical precautions, handout provided, he recalls all cervical precautions without cueing. Educated pt on compensatory strategies to complete ADLs without bending of neck, also educated pt to complete scanning with eyes during mobility to reduce risk of falls and trip hazards. Pt would benefit from continued acute skilled OT services to address deficits and help transition to next level of care. No follow-up OT recommended at this time.       Recommendations for follow up therapy are one component of a multi-disciplinary discharge planning process, led by the attending physician.  Recommendations may be updated based on patient status, additional functional criteria and insurance authorization.   Assistance Recommended at Discharge Intermittent Supervision/Assistance  Patient can return home with the following Two people to help with bathing/dressing/bathroom;Assistance with cooking/housework;Assist for transportation    Functional Status Assessment  Patient has had a recent decline  in their functional status and demonstrates the ability to make significant improvements in function in a reasonable and predictable amount of time.  Equipment Recommendations  None recommended by OT    Recommendations for Other Services       Precautions / Restrictions Precautions Precautions: Fall;Cervical Precaution Booklet Issued: Yes (comment) Required Braces or Orthoses: Cervical Brace Cervical Brace: Hard collar;At all times Restrictions Weight Bearing Restrictions: No      Mobility Bed Mobility               General bed mobility comments: Pt rec'd from toilet from NT,    Transfers Overall transfer level: Needs assistance Equipment used: Straight cane Transfers: Sit to/from Stand Sit to Stand: Min guard                  Balance Overall balance assessment: Needs assistance Sitting-balance support: Feet supported Sitting balance-Leahy Scale: Fair     Standing balance support: Single extremity supported, During functional activity Standing balance-Leahy Scale: Poor                             ADL either performed or assessed with clinical judgement   ADL Overall ADL's : Needs assistance/impaired Eating/Feeding: Sitting;Independent   Grooming: Standing;Min guard Grooming Details (indicate cue type and reason): Educated pt on compensatory strategies to maintain cervical precautions such as keeping neck neutral and avoiding bending by bringing objects to face for grooming. Upper Body Bathing: Sitting;Supervision/ safety;Cueing for compensatory techniques   Lower Body Bathing: Maximal assistance;Sitting/lateral leans   Upper Body Dressing : Cueing for compensatory techniques;Supervision/safety;Set up   Lower Body Dressing: Maximal assistance;Sitting/lateral leans   Toilet Transfer: Min guard   Toileting- Clothing Manipulation and Hygiene: Sit to/from stand;Min  guard Toileting - Clothing Manipulation Details (indicate cue type and  reason): no challenges with wiping     Functional mobility during ADLs: Cane;Min guard General ADL Comments: Pt complete hall level ambulation, able to traverse 1 step with use of L rail and SPC, min guard for safety. Pt needing UE support to get up step but reports his half step at home is much smaller than the steps     Vision         Perception     Praxis      Pertinent Vitals/Pain Pain Assessment Pain Assessment: Faces Faces Pain Scale: Hurts little more Pain Location: R shoulder, neck Pain Descriptors / Indicators: Discomfort, Guarding, Grimacing Pain Intervention(s): Repositioned, Monitored during session     Hand Dominance     Extremity/Trunk Assessment Upper Extremity Assessment Upper Extremity Assessment: Overall WFL for tasks assessed   Lower Extremity Assessment RLE Deficits / Details: Grossly 4+/5 LLE Deficits / Details: Grossly 4+/5   Cervical / Trunk Assessment Cervical / Trunk Assessment: Other exceptions Cervical / Trunk Exceptions: C7 fx. Increased right lateral flexion   Communication Communication Communication: No difficulties   Cognition Arousal/Alertness: Awake/alert Behavior During Therapy: WFL for tasks assessed/performed Overall Cognitive Status: Within Functional Limits for tasks assessed                                       General Comments  VSS on RA, Hard collar adjusted for better fit    Exercises     Shoulder Instructions      Home Living Family/patient expects to be discharged to:: Private residence Living Arrangements: Other (Comment) (girlfriend) Available Help at Discharge: Family Type of Home: House Home Access: Stairs to enter [employer] of Steps: 1 (4 inch step)   Home Layout: One level     Bathroom Shower/Tub: Producer, television/film/video: Standard     Home Equipment: Pharmacist, hospital (2 wheels);Cane - single point;BSC/3in1          Prior  Functioning/Environment Prior Level of Function : Independent/Modified Independent;Driving             Mobility Comments: uses cane ADLs Comments: girlfriend assists with IADL's and LB ADLs        OT Problem List: Impaired balance (sitting and/or standing);Pain      OT Treatment/Interventions: Therapeutic exercise;Therapeutic activities;Self-care/ADL training;DME and/or AE instruction;Patient/family education;Balance training    OT Goals(Current goals can be found in the care plan section) Acute Rehab OT Goals Patient Stated Goal: To go home OT Goal Formulation: With patient Time For Goal Achievement: 05/29/23 Potential to Achieve Goals: Good ADL Goals Pt Will Perform Grooming: standing;with supervision (using compensatory strategies) Pt Will Perform Upper Body Dressing: sitting;with supervision;with set-up Pt Will Perform Lower Body Dressing: with adaptive equipment;with supervision;sitting/lateral leans Pt Will Transfer to Toilet: with supervision;ambulating  OT Frequency: Min 1X/week    Co-evaluation              AM-PAC OT "6 Clicks" Daily Activity     Outcome Measure Help from another person eating meals?: None Help from another person taking care of personal grooming?: A Little Help from another person toileting, which includes using toliet, bedpan, or urinal?: A Little Help from another person bathing (including washing, rinsing, drying)?: A Lot Help from another person to put on and taking off regular upper body clothing?: A Little Help from another person  to put on and taking off regular lower body clothing?: A Lot 6 Click Score: 17   End of Session Equipment Utilized During Treatment: Gait belt;Cervical collar Nurse Communication: Mobility status  Activity Tolerance: Patient tolerated treatment well Patient left: in chair;with call bell/phone within reach;with chair alarm set  OT Visit Diagnosis: Unsteadiness on feet (R26.81);Pain                Time:  1610-9604 OT Time Calculation (min): 19 min Charges:  OT General Charges $OT Visit: 1 Visit OT Evaluation $OT Eval Moderate Complexity: 1 Mod  05/15/2023  AB, OTR/L  Acute Rehabilitation Services  Office: [phone]   Tristan Schroeder 05/15/2023, 12:38 PM

## 2023-05-15 NOTE — Progress Notes (Signed)
No ICM remote transmission received for 05/13/2023 due to currently hospitalized and next ICM transmission scheduled for 06/03/2023.

## 2023-05-15 NOTE — Progress Notes (Signed)
Discharge instructions given. Patient verbalized understanding and all questions were answered.  ?

## 2023-05-15 NOTE — Discharge Summary (Signed)
Physician Discharge Summary  [name] HQI:[id number] DOB: Dec 02, 1955 DOA: 05/14/2023  PCP: Zachary Levins, MD  Admit date: 05/14/2023 Discharge date: 05/15/2023 Admitted From: Home Disposition: Home Recommendations for Outpatient Follow-up:  Follow up with PCP and neurosurgery as below Check CMP and CBC at follow-up Patient is at risk for polypharmacy on multiple sedating medication.  Discussed risk with patient.  Encouraged to discuss with prescriber. Stitch removal from both knees in 7 days from 7/30. Please follow up on the following pending results: None  Home Health: PT/OT/RN Equipment/Devices: Patient has walker and cane.  Discharge Condition: Stable CODE STATUS: Full code  Follow-up Information     Zachary Pierini, MD. Schedule an appointment as soon as possible for a visit in 4 week(s).   Specialty: Neurosurgery Contact information: [name] [place] [address] [phone]         Zachary Levins, MD. Schedule an appointment as soon as possible for a visit in 1 week(s).   Specialties: Internal Medicine, Radiology Contact information: [address] [place] [address] [phone]         [facility] Follow up.   Specialty: Home Health Services Why: home health PT,OT and RN services will be provided by [facility], start of care within 48 hours post discharge Contact information: [address] [place] [address] [phone]                 Hospital course 67 year old M with PMH of systolic CHF/ICD, A-fib on Eliquis, COPD, OSA, anxiety, depression, insomnia, morbid obesity, recurrent falls and chronic pain presenting with right shoulder pain and bilateral knee laceration after fall at home.   In ED, stable vitals.  CMP without significant finding.  WBC 11.7.  Hgb 12.1.  Left and right knee x-ray without acute finding.  Bilateral knee laceration repaired in ED.  Chest x-ray with  trace left pleural effusion.  Pelvic and right shoulder x-ray without acute finding.  CT head without acute finding.  CT cervical spine showed acute nondisplaced fracture of the superior articular facet of C7.  Neurosurgery consulted and evaluated patient, and recommended neck collar and outpatient follow-up.  Patient was admitted for pain control.  On the day of discharge, remained stable.  Cardiology interrogated ICD and no events identified.  Pain improved.  Requested discharge to go home.  He declined going to SNF/rehab.  He is discharged with [facility] PT/OT/RN.  He has walker and cane.  Outpatient follow-up as above.  Patient is at risk for polypharmacy on multiple sedating medication including Xanax, Ambien, tramadol, Soma, hydroxyzine, trazodone and doxepin.  He is also on Eliquis.  Had extensive discussion about risk of polypharmacy with patient.  I have encouraged him to cut down on this medication and discuss with his prescriber.   See individual problem list below for more.   Problems addressed during this hospitalization Principal Problem:   Closed C7 fracture without spinal cord injury (HCC) Active Problems:   HFrEF (heart failure with reduced ejection fraction) (HCC)   COPD (chronic obstructive pulmonary disease) (HCC)   Essential hypertension   Depression   OSA (obstructive sleep apnea)   Chronic pain syndrome   Benign prostatic hyperplasia   ICD (implantable cardioverter-defibrillator) in place   Rotator cuff arthropathy of both shoulders   Accidental fall at home: No prodrome.  No LOC.  At risk for polypharmacy on multiple sedating medications. Bilateral knee laceration: Repaired in ED.  X-ray without fracture. Acute nondisplaced fracture of superior articular facet of C7 -Appreciate input by neurosurgery-Aspen collar and outpatient follow-up -Pain improved.  Therapy recommended SNF but patient prefers to go home with home health.   Chronic systolic CHF/AICD: TTE in 10/2022 with  LVEF of 40%, global hypokinesis, indeterminate DD, severe LAE and RAE.  On torsemide and eplerenone.  Difficult to assess fluid status due to habitus but appears euvolemic.  No cardiopulmonary symptoms. -Continue home meds.  Decreased Coreg from 6.25 mg twice daily to 3.125 mg twice daily due to soft BP.   Chronic COPD: Stable. -Continue home inhalers   Paroxysmal A-fib: Rate controlled.  On Coreg and Eliquis at home. -Decrease Coreg from 6.25 mg twice daily to 3.125 mg due to soft blood pressure -Continue Eliquis for anticoagulation   OSA not on CPAP   Essential hypertension: Normotensive but soft BP. -Decreased home Coreg as above   Anxiety/depression/insomnia/chronic pain: Stable -On multiple sedating medication.  Discussed risk and benefit.  Encouraged to discuss with prescribers.   At risk for polypharmacy -As above.     BPH -Continue home Flomax.           Time spent 35 minutes  Vital signs Vitals:   05/15/23 0005 05/15/23 0342 05/15/23 0434 05/15/23 0727  BP: 120/63 135/79  107/65  Pulse: 60 70  69  Temp: 98.2 F (36.8 C) 98.2 F (36.8 C)  97.6 F (36.4 C)  Resp: 17 17  18   Weight:   127.7 kg   SpO2: 96% 96%  96%  TempSrc: Oral   Oral     Discharge exam  GENERAL: Sitting on bedside chair. HEENT: MMM.  Vision and hearing grossly intact.  Bruise around left eye. NECK: Supple.  No apparent JVD.  RESP:  No IWOB. Good air movement bilaterally. CVS:  RRR. Heart sounds normal.  ABD/GI/GU: Bowel sounds present. Soft. Non tender.  MSK/EXT:  Moves extremities.  Bilateral knee laceration s/p repair.  Fair range of motion. SKIN: Bilateral knee laceration.  Left shin laceration. NEURO: Awake, alert and oriented appropriately.  No gross deficit.  PSYCH: Calm. Normal affect.   Discharge Instructions Discharge Instructions     Diet - low sodium heart healthy   Complete by: As directed    Discharge instructions   Complete by: As directed    It has been a  pleasure taking care of you!  You were hospitalized due to fall, neck bone fracture, knee laceration and bruise.  Continue neck collar until you follow-up with neurosurgery.  Follow-up with your primary care doctor in 7 days for stitch removal.  We strongly recommend using your walker.  Note that combination of tramadol, carisoprodol, alprazolam, trazodone, promethazine, hydroxyzine and zolpidem can increase your risk of drowsiness, sedation, impaired judgment, impaired balance that could increase your risk of fall and respiratory depression that could potentially lead to death. We strongly recommend talking to your doctors who prescribe them. We do not recommend driving, operating machinery or other activity that requires similar mental and physical engagement.     Take care,   Discharge wound care:   Complete by: As directed    Wound care  Daily      Comments: Apply Xeroform gauze to left calf and bilat knees Q day, then cover with foam dressings.  Change foam dressings Q 3 days or PRN soiling.  If they need to be held in place, apply ace wraps.   Increase activity slowly   Complete by: As directed  Allergies as of 05/15/2023       Reactions   Tape Other (See Comments)   SKIN TEARS VERY, VERY EASILY!!!! Use Paper Tape Only, PLEASE!!!   Amiodarone Other (See Comments)   Caused hyperthyroidism   Statins Other (See Comments)   Myalgias   Wound Dressing Adhesive Other (See Comments)   Skin Tears Use Paper Tape Only        Medication List     TAKE these medications    acetaminophen 500 MG tablet Commonly known as: TYLENOL Take 500-1,000 mg by mouth every 6 (six) hours as needed for mild pain or headache.   albuterol 108 (90 Base) MCG/ACT inhaler Commonly known as: VENTOLIN HFA Inhale 1-2 puffs into the lungs every 6 (six) hours as needed for wheezing or shortness of breath.   ALPRAZolam 0.5 MG tablet Commonly known as: XANAX TAKE 2 TABLETS (1 MG TOTAL) BY MOUTH EVERY  DAY AT BEDTIME   ammonium lactate 12 % cream Commonly known as: AMLACTIN Apply 1 Application topically as needed for dry skin.   apixaban 5 MG Tabs tablet Commonly known as: Eliquis Take 1 tablet (5 mg total) by mouth 2 (two) times daily.   augmented betamethasone dipropionate 0.05 % cream Commonly known as: DIPROLENE-AF Apply 1 application  topically daily as needed (to affected areas- for itching).   carisoprodol 350 MG tablet Commonly known as: SOMA TAKE 1 TABLET BY MOUTH THREE TIMES A DAY AS NEEDED FOR MUSCLE SPASM   carvedilol 3.125 MG tablet Commonly known as: COREG Take 1 tablet (3.125 mg total) by mouth 2 (two) times daily. What changed:  medication strength how much to take   clotrimazole-betamethasone cream Commonly known as: LOTRISONE Apply 1 Application topically daily.   cyanocobalamin 1000 MCG tablet Commonly known as: VITAMIN B12 Take 1,000 mcg by mouth daily.   doxepin 10 MG capsule Commonly known as: SINEQUAN TAKE 2 CAPSULES BY MOUTH AT BEDTIME AS NEEDED.   DULoxetine 60 MG capsule Commonly known as: CYMBALTA Take 60 mg by mouth daily.   Dupixent 300 MG/2ML Sopn Generic drug: Dupilumab Inject 300 mg into the skin every 14 (fourteen) days.   eplerenone 25 MG tablet Commonly known as: INSPRA Take 1 tablet (25 mg total) by mouth daily.   erythromycin ophthalmic ointment Place 1 Application into the right eye 3 (three) times daily.   Farxiga 10 MG Tabs tablet Generic drug: dapagliflozin propanediol Take 10 mg by mouth every morning.   fluconazole 100 MG tablet Commonly known as: Diflucan Take 1 tablet (100 mg total) by mouth daily.   fluticasone 50 MCG/ACT nasal spray Commonly known as: FLONASE PLACE 2 SPRAYS INTO BOTH NOSTRILS DAILY AS NEEDED FOR ALLERGIES OR RHINITIS.   fluticasone-salmeterol 250-50 MCG/ACT Aepb Commonly known as: Wixela Inhub Inhale 1 puff into the lungs in the morning and at bedtime.   Full Kit Nebulizer Set Misc 1  each by Does not apply route as needed.   hydrOXYzine 10 MG tablet Commonly known as: ATARAX TAKE 1 TABLET BY MOUTH 3 TIMES DAILY AS NEEDED FOR ITCHING. Strength: 10 mg   ipratropium-albuterol 0.5-2.5 (3) MG/3ML Soln Commonly known as: DUONEB TAKE 3 ML BY NEBULIZATION EVERY 4 HOURS AS NEEDED (WHEEZING).   ketoconazole 2 % cream Commonly known as: NIZORAL Apply 1 Application topically daily.   Klor-Con M20 20 MEQ tablet Generic drug: potassium chloride SA TAKE 1 TABLET BY MOUTH EVERY DAY   losartan 25 MG tablet Commonly known as: COZAAR Take 1 tablet (25 mg  total) by mouth daily.   omeprazole 20 MG capsule Commonly known as: PRILOSEC TAKE 1 CAPSULE BY MOUTH TWICE A DAY What changed: when to take this   ondansetron 4 MG tablet Commonly known as: ZOFRAN Take 1 tablet (4 mg total) by mouth every 6 (six) hours as needed for nausea.   predniSONE 10 MG tablet Commonly known as: DELTASONE TAKE 1 TABLET (10 MG TOTAL) BY MOUTH DAILY WITH BREAKFAST.   promethazine 25 MG tablet Commonly known as: PHENERGAN TAKE 1 TABLET BY MOUTH EVERY 8 HOURS AS NEEDED FOR NAUSEA OR VOMITING.   Repatha SureClick 140 MG/ML Soaj Generic drug: Evolocumab Inject 140 mg into the skin every 14 (fourteen) days.   Semaglutide(0.25 or 0.5MG /DOS) 2 MG/3ML Sopn Inject 0.25 mg once weekly for 4 weeks then increase to 0.5 mg weekly for 6 weeks   silver sulfADIAZINE 1 % cream Commonly known as: SILVADENE APPLY TOPICALLY TO AFFECTED AREA EVERY DAY   tamsulosin 0.4 MG Caps capsule Commonly known as: FLOMAX TAKE 1 CAPSULE BY MOUTH EVERY DAY   torsemide 20 MG tablet Commonly known as: DEMADEX Take 60 mg by mouth 2 (two) times daily.   traMADol 50 MG tablet Commonly known as: ULTRAM TAKE 1 TABLET BY MOUTH EVERY DAY AS NEEDED   traZODone 100 MG tablet Commonly known as: DESYREL TAKE 2 TABLETS BY MOUTH AT BEDTIME AS NEEDED   Tums 500 MG chewable tablet Generic drug: calcium carbonate Chew  500-2,000 mg by mouth every 4 (four) hours as needed for indigestion or heartburn.   valACYclovir 1000 MG tablet Commonly known as: VALTREX TAKE 1 TABLET BY MOUTH THREE TIMES A DAY FOR 7 DAYS   venlafaxine XR 150 MG 24 hr capsule Commonly known as: Effexor XR Take 1 capsule (150 mg total) by mouth daily with breakfast.   Vitamin D3 1000 units Caps Take 1,000 Units by mouth daily.   zolpidem 10 MG tablet Commonly known as: AMBIEN TAKE 1 TABLET BY MOUTH EVERY DAY AT BEDTIME AS NEEDED               Discharge Care Instructions  (From admission, onward)           Start     Ordered   05/15/23 0000  Discharge wound care:       Comments: Wound care  Daily      Comments: Apply Xeroform gauze to left calf and bilat knees Q day, then cover with foam dressings.  Change foam dressings Q 3 days or PRN soiling.  If they need to be held in place, apply ace wraps.   05/15/23 0958            Consultations: Neurosurgery  Procedures/Studies:   CT CERVICAL SPINE WO CONTRAST  Result Date: 05/14/2023 CLINICAL DATA:  Fall EXAM: CT CERVICAL SPINE WITHOUT CONTRAST TECHNIQUE: Multidetector CT imaging of the cervical spine was performed without intravenous contrast. Multiplanar CT image reconstructions were also generated. RADIATION DOSE REDUCTION: This exam was performed according to the departmental dose-optimization program which includes automated exposure control, adjustment of the mA and/or kV according to patient size and/or use of iterative reconstruction technique. COMPARISON:  None Available. FINDINGS: Alignment: No static subluxation. Facets are aligned. Occipital condyles and the lateral masses of C1 and C2 are normally approximated. Skull base and vertebrae: Nondisplaced fracture of the superior articular facet of C7 (series 8, image 35). Soft tissues and spinal canal: No prevertebral fluid or swelling. No visible canal hematoma. Disc levels: No advanced spinal  canal or neural  foraminal stenosis. Upper chest: No pneumothorax, pulmonary nodule or pleural effusion. Other: Normal visualized paraspinal cervical soft tissues. IMPRESSION: Acute nondisplaced fracture of the superior articular facet of C7. (AO Spine C7 F1) Electronically Signed   By: Deatra Robinson M.D.   On: 05/14/2023 03:35   CT HEAD WO CONTRAST ( )  Result Date: 05/14/2023 CLINICAL DATA:  Head trauma, minor (Age >= 65y) EXAM: CT HEAD WITHOUT CONTRAST TECHNIQUE: Contiguous axial images were obtained from the base of the skull through the vertex without intravenous contrast. RADIATION DOSE REDUCTION: This exam was performed according to the departmental dose-optimization program which includes automated exposure control, adjustment of the mA and/or kV according to patient size and/or use of iterative reconstruction technique. COMPARISON:  MRI head 09/25/2012 FINDINGS: Brain: No evidence of large-territorial acute infarction. No parenchymal hemorrhage. No mass lesion. No extra-axial collection. No mass effect or midline shift. No hydrocephalus. Basilar cisterns are patent. Vascular: No hyperdense vessel. Skull: No acute fracture or focal lesion. Sinuses/Orbits: Left sphenoid and right maxillary sinus mucosal thickening. Paranasal sinuses and mastoid air cells are clear. Bilateral lens replacement. Otherwise the orbits are unremarkable. Other: None. IMPRESSION: No acute intracranial abnormality. Electronically Signed   By: Tish Frederickson M.D.   On: 05/14/2023 03:26   DG Shoulder Right  Result Date: 05/14/2023 CLINICAL DATA:  fall EXAM: RIGHT SHOULDER - 2+ VIEW COMPARISON:  X-ray right shoulder 08/23/2020 FINDINGS: There is no evidence of fracture or dislocation. There is no evidence of arthropathy or other focal bone abnormality. Soft tissues are unremarkable. IMPRESSION: Negative. Electronically Signed   By: Tish Frederickson M.D.   On: 05/14/2023 03:23   DG Knee Left Port  Result Date: 05/14/2023 CLINICAL DATA:  fall  EXAM: PORTABLE LEFT KNEE - 1-2 VIEW COMPARISON:  X-ray left knee 06/13/2022 FINDINGS: Total knee arthroplasty. No no evidence of fracture, dislocation, or joint effusion. No evidence of arthropathy or other focal bone abnormality. Soft tissues are unremarkable. IMPRESSION: 1. Total knee arthroplasty. 2. No acute displaced fracture or dislocation. Electronically Signed   By: Tish Frederickson M.D.   On: 05/14/2023 03:22   DG Pelvis Portable  Result Date: 05/14/2023 CLINICAL DATA:  fall EXAM: PORTABLE PELVIS 1-2 VIEWS COMPARISON:  None Available. FINDINGS: There is no evidence of pelvic fracture or diastasis. No acute displaced fracture or dislocation of either hip spine. No pelvic bone lesions are seen. Vascular calcifications. IMPRESSION: Negative for acute traumatic injury. Electronically Signed   By: Tish Frederickson M.D.   On: 05/14/2023 03:20   DG Chest Port 1 View  Result Date: 05/14/2023 CLINICAL DATA:  Trauma level 2 Fall on thinners lacerations to both lower extremities, pain to the right shoulder EXAM: PORTABLE CHEST 1 VIEW COMPARISON:  Chest x-ray 01/25/2023 FINDINGS: Left chest wall dual lead pacemaker defibrillator. The heart and mediastinal contours are unchanged. No focal consolidation. No pulmonary edema. At least trace left pleural effusion. No pneumothorax. No acute osseous abnormality. IMPRESSION: At least trace left pleural effusion. Electronically Signed   By: Tish Frederickson M.D.   On: 05/14/2023 03:19   DG Knee Right Port  Result Date: 05/14/2023 CLINICAL DATA:  fall EXAM: PORTABLE RIGHT KNEE - 1-2 VIEW COMPARISON:  X-ray right knee 11/20/2022 FINDINGS: Total knee arthroplasty. No evidence of fracture, dislocation, or joint effusion. No evidence of arthropathy or other focal bone abnormality. Soft tissues are unremarkable. Vascular calcifications. IMPRESSION: 1. Total knee arthroplasty. 2.  No acute displaced fracture or dislocation. Electronically Signed   By:  Tish Frederickson M.D.    On: 05/14/2023 03:17   CUP PACEART REMOTE DEVICE CHECK  Result Date: 04/25/2023 Scheduled remote reviewed. Normal device function.  Next remote 91 days. Hassell Halim, RN, CCDS, [employer]      The results of significant diagnostics from this hospitalization (including imaging, microbiology, ancillary and laboratory) are listed below for reference.     Microbiology: No results found for this or any previous visit (from the past 240 hour(s)).   Labs:  CBC: Recent Labs  Lab 05/14/23 0240 05/15/23 0149  WBC 11.7* 10.5  NEUTROABS 8.7*  --   HGB 12.1* 12.3*  HCT 38.5* 38.6*  MCV 103.8* 101.3*  PLT 241 226   BMP &GFR Recent Labs  Lab 05/14/23 0240 05/15/23 0149  NA 138 138  K 4.2 3.8  CL 102 99  CO2 26 27  GLUCOSE 113* 97  BUN 37* 29*  CREATININE 1.23 0.99  CALCIUM 8.5* 8.7*  MG  --  2.6*   Estimated Creatinine Clearance: 95.9 mL/min (by C-G formula based on SCr of 0.99 mg/dL). Liver & Pancreas: Recent Labs  Lab 05/14/23 0240  AST 20  ALT 29  ALKPHOS 43  BILITOT 0.2*  PROT 5.6*  ALBUMIN 3.1*   No results for input(s): "LIPASE", "AMYLASE" in the last 168 hours. No results for input(s): "AMMONIA" in the last 168 hours. Diabetic: No results for input(s): "HGBA1C" in the last 72 hours. No results for input(s): "GLUCAP" in the last 168 hours. Cardiac Enzymes: No results for input(s): "CKTOTAL", "CKMB", "CKMBINDEX", "TROPONINI" in the last 168 hours. No results for input(s): "PROBNP" in the last 8760 hours. Coagulation Profile: Recent Labs  Lab 05/14/23 0240  INR 1.0   Thyroid Function Tests: No results for input(s): "TSH", "T4TOTAL", "FREET4", "T3FREE", "THYROIDAB" in the last 72 hours. Lipid Profile: No results for input(s): "CHOL", "HDL", "LDLCALC", "TRIG", "CHOLHDL", "LDLDIRECT" in the last 72 hours. Anemia Panel: No results for input(s): "VITAMINB12", "FOLATE", "FERRITIN", "TIBC", "IRON", "RETICCTPCT" in the last 72 hours. Urine  analysis:    Component Value Date/Time   COLORURINE YELLOW 01/15/2023 1532   APPEARANCEUR Sl Cloudy (A) 01/15/2023 1532   LABSPEC 1.020 01/15/2023 1532   PHURINE 6.0 01/15/2023 1532   GLUCOSEU 250 (A) 01/15/2023 1532   HGBUR NEGATIVE 01/15/2023 1532   BILIRUBINUR NEGATIVE 01/15/2023 1532   KETONESUR NEGATIVE 01/15/2023 1532   PROTEINUR NEGATIVE 12/09/2019 1251   UROBILINOGEN 0.2 01/15/2023 1532   NITRITE NEGATIVE 01/15/2023 1532   LEUKOCYTESUR TRACE (A) 01/15/2023 1532   Sepsis Labs: Invalid input(s): "PROCALCITONIN", "LACTICIDVEN"   SIGNED:  Almon Hercules, MD  [facility] 05/15/2023, 2:26 PM

## 2023-05-16 ENCOUNTER — Other Ambulatory Visit (HOSPITAL_COMMUNITY): Payer: Self-pay | Admitting: Emergency Medicine

## 2023-05-16 NOTE — Progress Notes (Signed)
Paramedicine Encounter    Patient ID: [name], male    DOB: [birth year], 67 y.o.   MRN: [id number]   Complaints Generalized pain to multiple areas from recent fall  Assessment A&O x 4, bruising and skin tear to both lower legs.  Stitches in both knees, left eye black and bruised.  Compliance with meds Yes  Pill box filled N/A  Refills needed NONE  Meds changes since last visit NONE    Social changes NONE   There were no vitals taken for this visit. Weight yesterday-not taken Last visit weight-270lb  Met with [name] today for home visit.  He was scheduled last 7/18 and called to rescheduled as he had a conflict.  Today, pt was found sitting in his recliner at home wearing a c-collar.  He suffered a fall 7/30 which required transport to the hospital.  He states, " I lost my balance and fell".  Pt. States he suffered a C7 Fx, suturable lacerations to both knee cap area. 11 stitches to the rt knee and 7 stitches to the lft knee.  He also has bandaged skin tears to both lower legs.  He was transported by ambulance for this event. Reviewed meds today with pt verbally.  He is able to recite meds w/ dosages and times w/o difficulty.   Today we discussed discharge from Paramedicine program.  He has demonstrated that he is able to handle his own medications, call for refills, has PCP and transportation needed to get to his appointments and pick up his meds. [name] that he understands how to reach out to the [facility] Clinic w/ questions and/or needs.  Also advised him he can call me anytime with questions or concerns and I will assist him if I can or point him to the proper resource.  He agrees he is comfortable with graduating at this time. ACTION: Home visit completed Discharged from Paramedicine.  Beatrix Shipper, EMT-Paramedic [phone] 05/16/23  Patient Care Team: [name], MD as PCP - General (Internal Medicine) Laurey Morale, MD as PCP - Advanced Heart Failure  (Cardiology) Duke Salvia, MD as PCP - Electrophysiology (Cardiology) Himmelrich, Loree Fee, RD (Inactive) as Dietitian Duke Salvia, MD as Consulting Physician (Cardiology) Kathryne Hitch, MD as Consulting Physician (Orthopedic Surgery) Levy Pupa, MD as Referring Physician (Ophthalmology)  Patient Active Problem List   Diagnosis Date Noted   Closed C7 fracture without spinal cord injury (HCC) 05/14/2023   Shingles 03/03/2023   Epigastric pain 01/16/2023   Nausea and vomiting 01/16/2023   Cardiac arrhythmia 01/16/2023   Cellulitis and abscess of leg, except foot 11/21/2022   Cellulitis of right lower extremity without foot 11/20/2022   Anxiety and depression 05/20/2022   AC (acromioclavicular) arthritis 04/24/2022   Cataract, nuclear sclerotic, left eye 03/07/2022   ED (erectile dysfunction) 01/03/2022   Intertrigo 12/24/2021   Gluteal tendinitis of left buttock 12/19/2021   Unilateral primary osteoarthritis, left knee 10/17/2021   Status post total knee replacement, left 10/17/2021   Rib pain on right side 10/10/2021   History of colonic polyps 09/26/2021   Bursitis of left hip 06/13/2021   Tooth fracture 06/11/2021   Pain and swelling of right lower leg 05/18/2021   Gastrointestinal hemorrhage    Diverticulitis of colon    Preop exam for internal medicine 03/04/2021   B12 deficiency 03/04/2021   Vitamin D deficiency 03/01/2021   Myalgia 12/01/2020   Laceration of left leg 09/17/2020   Statin  myopathy 08/02/2020   Rotator cuff arthropathy of both shoulders 02/23/2020   ICD (implantable cardioverter-defibrillator) in place 12/27/2019   Elevated PSA 09/23/2019   Lip lesion 12/23/2018   Diabetic ulcer of left lower leg with necrosis of muscle (HCC) 12/05/2018   Chronic bursitis of left shoulder 07/01/2018   Pain due to total knee replacement (HCC) 03/31/2018   Rotator cuff arthropathy, left 02/13/2018   Right rotator cuff tear arthropathy 01/14/2018    Increased prostate specific antigen (PSA) velocity 09/26/2017   HFrEF (heart failure with reduced ejection fraction) (HCC) 09/18/2017   Trigger point of thoracic region 07/04/2017   Right lumbar radiculopathy 05/15/2017   Right leg swelling 05/09/2017   Glaucoma 04/18/2017   History of nasal polyposis 02/19/2017   Chronic rhinitis 02/19/2017   Microhematuria 12/04/2016   Hyperlipidemia 08/15/2016   Encounter for well adult exam with abnormal findings 11/29/2015   Syncope 03/30/2015   Former tobacco use 02/25/2015   Type 2 diabetes mellitus (HCC) 02/25/2015   Permanent atrial fibrillation (HCC) 02/25/2015   Anemia 02/23/2015   Thyrotoxicosis 02/23/2015   Hx of adenomatous colonic polyps 10/21/2014   De Quervain's tenosynovitis, right 04/30/2014   Osteoarthritis of right knee 02/19/2014   Encounter for therapeutic drug monitoring 11/17/2013   Thoracic degenerative disc disease 06/17/2013   Thoracic spondylosis without myelopathy 02/11/2013   Lumbosacral spondylosis without myelopathy 02/11/2013   Degenerative joint disease of knee, left 02/11/2013   COPD (chronic obstructive pulmonary disease) (HCC) 08/20/2012   Bundle branch block, right and extreme left anterior fascicular 05/05/2012   Nonischemic cardiomyopathy (HCC) 05/02/2012   Anticoagulated on warfarin 03/21/2012   Spongiotic dermatitis 09/26/2011   Past myocardial infarction 04/19/2011   UNSPECIFIED URETHRAL STRICTURE 12/26/2010   Chronic pain syndrome 05/18/2010   OSA (obstructive sleep apnea) 10/21/2007   NEPHROLITHIASIS, HX OF 10/21/2007   GERD (gastroesophageal reflux disease) 06/09/2007   Benign prostatic hyperplasia 06/09/2007   Morbid obesity (HCC) 06/03/2007   Depression 06/03/2007   Essential hypertension 06/03/2007   INSOMNIA 06/03/2007    Current Outpatient Medications:    acetaminophen (TYLENOL) 500 MG tablet, Take 500-1,000 mg by mouth every 6 (six) hours as needed for mild pain or headache., Disp: ,  Rfl:    albuterol (VENTOLIN HFA) 108 (90 Base) MCG/ACT inhaler, Inhale 1-2 puffs into the lungs every 6 (six) hours as needed for wheezing or shortness of breath., Disp: 18 g, Rfl: 5   ALPRAZolam (XANAX) 0.5 MG tablet, TAKE 2 TABLETS (1 MG TOTAL) BY MOUTH EVERY DAY AT BEDTIME, Disp: 60 tablet, Rfl: 5   ammonium lactate (AMLACTIN) 12 % cream, Apply 1 Application topically as needed for dry skin., Disp: 385 g, Rfl: 3   apixaban (ELIQUIS) 5 MG TABS tablet, Take 1 tablet (5 mg total) by mouth 2 (two) times daily., Disp: 60 tablet, Rfl: 11   augmented betamethasone dipropionate (DIPROLENE-AF) 0.05 % cream, Apply 1 application  topically daily as needed (to affected areas- for itching)., Disp: , Rfl:    carisoprodol (SOMA) 350 MG tablet, TAKE 1 TABLET BY MOUTH THREE TIMES A DAY AS NEEDED FOR MUSCLE SPASM, Disp: 90 tablet, Rfl: 2   carvedilol (COREG) 3.125 MG tablet, Take 1 tablet (3.125 mg total) by mouth 2 (two) times daily., Disp: 180 tablet, Rfl: 1   Cholecalciferol (VITAMIN D3) 1000 units CAPS, Take 1,000 Units by mouth daily., Disp: , Rfl:    clotrimazole-betamethasone (LOTRISONE) cream, Apply 1 Application topically daily., Disp: 30 g, Rfl: 0   cyanocobalamin (VITAMIN B12) 1000  MCG tablet, Take 1,000 mcg by mouth daily., Disp: , Rfl:    doxepin (SINEQUAN) 10 MG capsule, TAKE 2 CAPSULES BY MOUTH AT BEDTIME AS NEEDED., Disp: 180 capsule, Rfl: 1   DULoxetine (CYMBALTA) 60 MG capsule, Take 60 mg by mouth daily., Disp: , Rfl:    Dupilumab (DUPIXENT) 300 MG/2ML SOPN, Inject 300 mg into the skin every 14 (fourteen) days., Disp: , Rfl:    eplerenone (INSPRA) 25 MG tablet, Take 1 tablet (25 mg total) by mouth daily., Disp: 30 tablet, Rfl: 8   erythromycin ophthalmic ointment, Place 1 Application into the right eye 3 (three) times daily., Disp: 3.5 g, Rfl: 0   Evolocumab (REPATHA SURECLICK) 140 MG/ML SOAJ, Inject 140 mg into the skin every 14 (fourteen) days., Disp: 6 mL, Rfl: 3   FARXIGA 10 MG TABS tablet,  Take 10 mg by mouth every morning., Disp: , Rfl:    fluconazole (DIFLUCAN) 100 MG tablet, Take 1 tablet (100 mg total) by mouth daily., Disp: 7 tablet, Rfl: 0   fluticasone (FLONASE) 50 MCG/ACT nasal spray, PLACE 2 SPRAYS INTO BOTH NOSTRILS DAILY AS NEEDED FOR ALLERGIES OR RHINITIS., Disp: 48 mL, Rfl: 1   fluticasone-salmeterol (WIXELA INHUB) 250-50 MCG/ACT AEPB, Inhale 1 puff into the lungs in the morning and at bedtime., Disp: 60 each, Rfl: 11   hydrOXYzine (ATARAX) 10 MG tablet, TAKE 1 TABLET BY MOUTH 3 TIMES DAILY AS NEEDED FOR ITCHING. Strength: 10 mg, Disp: 60 tablet, Rfl: 2   ipratropium-albuterol (DUONEB) 0.5-2.5 (3) MG/3ML SOLN, TAKE 3 ML BY NEBULIZATION EVERY 4 HOURS AS NEEDED (WHEEZING)., Disp: 360 mL, Rfl: 0   ketoconazole (NIZORAL) 2 % cream, Apply 1 Application topically daily. (Patient not taking: Reported on 05/14/2023), Disp: 120 g, Rfl: 1   KLOR-CON M20 20 MEQ tablet, TAKE 1 TABLET BY MOUTH EVERY DAY, Disp: 90 tablet, Rfl: 3   losartan (COZAAR) 25 MG tablet, Take 1 tablet (25 mg total) by mouth daily., Disp: 90 tablet, Rfl: 3   omeprazole (PRILOSEC) 20 MG capsule, TAKE 1 CAPSULE BY MOUTH TWICE A DAY (Patient taking differently: Take 20 mg by mouth daily.), Disp: 180 capsule, Rfl: 1   ondansetron (ZOFRAN) 4 MG tablet, Take 1 tablet (4 mg total) by mouth every 6 (six) hours as needed for nausea., Disp: 40 tablet, Rfl: 0   predniSONE (DELTASONE) 10 MG tablet, TAKE 1 TABLET (10 MG TOTAL) BY MOUTH DAILY WITH BREAKFAST., Disp: 30 tablet, Rfl: 0   promethazine (PHENERGAN) 25 MG tablet, TAKE 1 TABLET BY MOUTH EVERY 8 HOURS AS NEEDED FOR NAUSEA OR VOMITING., Disp: 30 tablet, Rfl: 0   Respiratory Therapy Supplies (FULL KIT NEBULIZER SET) MISC, 1 each by Does not apply route as needed., Disp: 1 each, Rfl: 0   Semaglutide,0.25 or 0.5MG /DOS, 2 MG/3ML SOPN, Inject 0.25 mg once weekly for 4 weeks then increase to 0.5 mg weekly for 6 weeks (Patient not taking: Reported on 05/14/2023), Disp: 3 mL, Rfl:  1   silver sulfADIAZINE (SILVADENE) 1 % cream, APPLY TOPICALLY TO AFFECTED AREA EVERY DAY, Disp: 50 g, Rfl: 3   tamsulosin (FLOMAX) 0.4 MG CAPS capsule, TAKE 1 CAPSULE BY MOUTH EVERY DAY, Disp: 90 capsule, Rfl: 3   torsemide (DEMADEX) 20 MG tablet, Take 60 mg by mouth 2 (two) times daily., Disp: , Rfl:    traMADol (ULTRAM) 50 MG tablet, TAKE 1 TABLET BY MOUTH EVERY DAY AS NEEDED, Disp: 30 tablet, Rfl: 5   traZODone (DESYREL) 100 MG tablet, TAKE 2 TABLETS BY  MOUTH AT BEDTIME AS NEEDED, Disp: 180 tablet, Rfl: 1   TUMS 500 MG chewable tablet, Chew 500-2,000 mg by mouth every 4 (four) hours as needed for indigestion or heartburn., Disp: , Rfl:    valACYclovir (VALTREX) 1000 MG tablet, TAKE 1 TABLET BY MOUTH THREE TIMES A DAY FOR 7 DAYS, Disp: 21 tablet, Rfl: 0   venlafaxine XR (EFFEXOR XR) 150 MG 24 hr capsule, Take 1 capsule (150 mg total) by mouth daily with breakfast., Disp: 90 capsule, Rfl: 3   zolpidem (AMBIEN) 10 MG tablet, TAKE 1 TABLET BY MOUTH EVERY DAY AT BEDTIME AS NEEDED, Disp: 90 tablet, Rfl: 1 Allergies  Allergen Reactions   Tape Other (See Comments)    SKIN TEARS VERY, VERY EASILY!!!! Use Paper Tape Only, PLEASE!!!   Amiodarone Other (See Comments)    Caused hyperthyroidism    Statins Other (See Comments)    Myalgias    Wound Dressing Adhesive Other (See Comments)    Skin Tears Use Paper Tape Only     Social History   Socioeconomic History   Marital status: Divorced    Spouse name: Not on file   Number of children: 2   Years of education: Not on file   Highest education level: Not on file  Occupational History   Occupation: retired/disabled. prev worked in Teacher, English as a foreign language.    Employer: DISABLED  Tobacco Use   Smoking status: Former    Current packs/day: 0.00    Average packs/day: 2.0 packs/day for 30.0 years (60.0 ttl pk-yrs)    Types: Cigarettes    Start date: 10/15/1977    Quit date: 10/16/2007    Years since quitting: 15.5   Smokeless tobacco: Never  Vaping  Use   Vaping status: Never Used  Substance and Sexual Activity   Alcohol use: No   Drug use: No   Sexual activity: Not Currently  Other Topics Concern   Not on file  Social History Narrative   Lives alone.   Social Determinants of Health   Financial Resource Strain: Low Risk  (06/29/2022)   Overall Financial Resource Strain (CARDIA)    Difficulty of Paying Living Expenses: Not hard at all  Food Insecurity: No Food Insecurity (11/26/2022)   Hunger Vital Sign    Worried About Running Out of Food in the Last Year: Never true    Ran Out of Food in the Last Year: Never true  Transportation Needs: No Transportation Needs (11/26/2022)   PRAPARE - Administrator, Civil Service (Medical): No    Lack of Transportation (Non-Medical): No  Physical Activity: Inactive (06/29/2022)   Exercise Vital Sign    Days of Exercise per Week: 0 days    Minutes of Exercise per Session: 0 min  Stress: No Stress Concern Present (06/29/2022)   [employer] of Occupational Health - Occupational Stress Questionnaire    Feeling of Stress : Not at all  Social Connections: Moderately Integrated (06/29/2022)   Social Connection and Isolation Panel [NHANES]    Frequency of Communication with Friends and Family: More than three times a week    Frequency of Social Gatherings with Friends and Family: More than three times a week    Attends Religious Services: More than 4 times per year    Active Member of [employer] or Organizations: Yes    Attends Banker Meetings: More than 4 times per year    Marital Status: Divorced  Intimate Partner Violence: Not At Risk (11/22/2022)   Humiliation, Afraid,  Rape, and Kick questionnaire    Fear of Current or Ex-Partner: No    Emotionally Abused: No    Physically Abused: No    Sexually Abused: No    Physical Exam      Future Appointments  Date Time Provider Department Center  05/21/2023  1:45 PM Edwin Cap, DPM [facility]  05/24/2023   3:00 PM Judi Saa, DO [facility] None  06/03/2023  7:30 AM [facility] DEVICE REMOTES [facility]  06/12/2023  2:00 PM [facility] PA/NP [facility] None  06/13/2023  3:15 PM Luciano Cutter, MD [facility]  07/01/2023  1:20 PM [name], MD [facility] None  07/24/2023  9:05 AM [facility] DEVICE REMOTES [facility]  10/23/2023  9:05 AM [facility] DEVICE REMOTES [facility]  01/22/2024  9:05 AM [facility] DEVICE REMOTES [facility]  04/22/2024  9:05 AM [facility] DEVICE REMOTES [facility]      Patient is now discharged from [facility].  Patient has/has not met the following goals:  Yes :Patient expresses basic understanding of medications and what they are for Yes :Patient able to verbalize heart failure specific dietary/fluid restrictions Yes :Patient is aware of who to call if they have medical concerns or if they need to schedule or change appts Yes :Patient has a scale for daily weights and weighs regularly Yes :Patient able to verbalize concerning symptoms when they should call the HF clinic (weight gain ranges, etc) Yes :Patient has a PCP and has seen within the past year or has upcoming appt Yes :Patient has reliable access to getting their medications Yes :Patient has shown they are able to reorder medications reliably No :Patient has had admission in past 30 days- if yes how many? No :Patient has had admission in past 90 days- if yes how many?  Discharge Comments: Pt. Agrees he is ready for graduation from the Paramedicine program at this time.

## 2023-05-19 ENCOUNTER — Other Ambulatory Visit: Payer: Self-pay | Admitting: Family Medicine

## 2023-05-19 ENCOUNTER — Other Ambulatory Visit: Payer: Self-pay | Admitting: Internal Medicine

## 2023-05-19 DIAGNOSIS — L304 Erythema intertrigo: Secondary | ICD-10-CM

## 2023-05-21 ENCOUNTER — Encounter: Payer: Self-pay | Admitting: Internal Medicine

## 2023-05-21 ENCOUNTER — Ambulatory Visit: Payer: Medicare HMO | Admitting: Podiatry

## 2023-05-21 NOTE — Telephone Encounter (Signed)
Ok to help pt make appt to have this done here at 7-10 days after they were placed   thanks

## 2023-05-23 ENCOUNTER — Ambulatory Visit: Payer: Medicare HMO | Admitting: Internal Medicine

## 2023-05-23 VITALS — BP 132/76 | HR 82 | Temp 97.8°F | Ht 70.0 in | Wt 270.0 lb

## 2023-05-23 DIAGNOSIS — S81802D Unspecified open wound, left lower leg, subsequent encounter: Secondary | ICD-10-CM | POA: Diagnosis not present

## 2023-05-23 DIAGNOSIS — S12690A Other displaced fracture of seventh cervical vertebra, initial encounter for closed fracture: Secondary | ICD-10-CM

## 2023-05-23 DIAGNOSIS — L304 Erythema intertrigo: Secondary | ICD-10-CM

## 2023-05-23 DIAGNOSIS — E1165 Type 2 diabetes mellitus with hyperglycemia: Secondary | ICD-10-CM

## 2023-05-23 DIAGNOSIS — L03115 Cellulitis of right lower limb: Secondary | ICD-10-CM | POA: Diagnosis not present

## 2023-05-23 MED ORDER — TRAMADOL HCL 50 MG PO TABS: 50.0000 mg | ORAL_TABLET | Freq: Every day | ORAL | 5 refills | Status: DC | PRN

## 2023-05-23 MED ORDER — NYSTATIN 100000 UNIT/GM EX POWD: CUTANEOUS | 2 refills | Status: DC

## 2023-05-23 MED ORDER — FLUCONAZOLE 100 MG PO TABS: 100.0000 mg | ORAL_TABLET | Freq: Every day | ORAL | 0 refills | Status: DC

## 2023-05-23 MED ORDER — CLINDAMYCIN HCL 300 MG PO CAPS: 300.0000 mg | ORAL_CAPSULE | Freq: Three times a day (TID) | ORAL | 0 refills | Status: DC

## 2023-05-23 NOTE — Patient Instructions (Addendum)
Please take all new medication as prescribed - the antibiotic for the knee, and the diflucan and nystatin for the groin rash, and the pain medication  Please continue all other medications as before, and refills have been done if requested.  Please have the pharmacy call with any other refills you may need.  Please continue your efforts at being more active, low cholesterol diet, and weight control  Please keep your appointments with your specialists as you may have planned - [facility] aug 28  You will be contacted regarding the referral for: Wound clinic for the left leg wound  We can hold on lab testing today  Please make an Appointment to return in sept 16, or sooner if needed, ok for nurse visit next wk for stitches out
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
## 2023-05-23 NOTE — Progress Notes (Signed)
Patient ID: [name], male   DOB: 02-Dec-1955, 67 y.o.   MRN: [id number]        Chief Complaint: follow up recent hospn with c spine fx after fall, , right knee cellulitis, groin rash, dm       HPI:  [name] is a 67 y.o. male here after fall and hospd July 30-31 with non displaced cervical spine fx, as well as laceration to right knee, skin tear to mid anterior left leg.  Overall diong ok, has uncontrolled pain asking for pain med refill. Also with worsening redness swelling tender of right anterior knee with wound stitched and remains intact.  Has skin tear wound left mid anterior leg with flap seated welll, bleeding stopped, now will need wound clinic f/u.  Pt denies chest pain, increased sob or doe, wheezing, orthopnea, PND, increased LE swelling, palpitations, dizziness or syncope.  Pt denies polydipsia, polyuria, or new focal neuro s/s.   Also has much worsening bilateral groin rash with smell  and itchi bad today.  Has planned NS f/u aug 28.          Wt Readings from Last 3 Encounters:  05/24/23 280 lb (127 kg)  05/23/23 270 lb (122.5 kg)  05/15/23 281 lb 8.4 oz (127.7 kg)   BP Readings from Last 3 Encounters:  05/24/23 110/68  05/23/23 132/76  05/16/23 130/80         Past Medical History:  Diagnosis Date   AICD (automatic cardioverter/defibrillator) present    Anemia    supposed to be taking Vit B but doesn't   ANXIETY    takes Xanax nightly   Arthritis    Asthma    Albuterol prn and Advair daily;also takes Prednisone daily   Cardiomyopathy (HCC)    a. EF 25% TEE July 2013; b. EF normalized 2015;  c. 03/2015 Echo: EF 40-45%, difrf HK, PASP 38 mmHg, Mild MR, sev LAE/RAE.   Chronic constipation    takes OTC stool softener   COPD (chronic obstructive pulmonary disease) (HCC)    "one dr says COPD; one dr says emphysema" (09/18/2017)   COVID-19 12/09/2019   DEPRESSION    takes Zoloft and Doxepin daily   Diverticulitis    DYSKINESIA, ESOPHAGUS    Essential hypertension         FIBROMYALGIA    GERD (gastroesophageal reflux disease)        Glaucoma    HYPERLIPIDEMIA    a. Intolerant to statins.   INSOMNIA    takes Ambien nightly   Myocardial infarction [facility])    a. 2012 Myoview notable for prior infarct;  b. 03/2015 Lexiscan CL: EF 37%, diff HK, small area of inferior infarct from apex to base-->Med Rx.   O2 dependent    "2.5L q hs & prn" (09/18/2017)   Paroxysmal atrial fibrillation (HCC)    a. CHA2DS2VASc = 3--> takes Coumadin;  b. 03/15/2015 Successful TEE/DCCV;  c. 03/2015 recurrent afib, Amio d/c'd in setting of hyperthyroidism.   Peripheral neuropathy    Pneumonia 12/2016   Rash and other nonspecific skin eruption 04/12/2009   no cause found saw dermatologists x 2 and allergist   SLEEP APNEA, OBSTRUCTIVE    a. doesn't use CPAP   Syncope    a. 03/2015 s/p MDT LINQ.   Type II diabetes mellitus (HCC)        Past Surgical History:  Procedure Laterality Date   ACNE CYST REMOVAL     2 on back  AV NODE ABLATION N/A 10/25/2017   Procedure: AV NODE ABLATION;  Surgeon: Duke Salvia, MD;  Location: [facility] INVASIVE CV LAB;  Service: Cardiovascular;  Laterality: N/A;   BIV ICD INSERTION CRT-D N/A 09/18/2017   Procedure: BIV ICD INSERTION CRT-D;  Surgeon: Duke Salvia, MD;  Location: [facility] INVASIVE CV LAB;  Service: Cardiovascular;  Laterality: N/A;   CARDIAC CATHETERIZATION N/A 03/21/2016   Procedure: Right/Left Heart Cath and Coronary Angiography;  Surgeon: Laurey Morale, MD;  Location: [facility] INVASIVE CV LAB;  Service: Cardiovascular;  Laterality: N/A;   CARDIOVERSION  04/18/2012   Procedure: CARDIOVERSION;  Surgeon: Pricilla Riffle, MD;  Location: [facility] OR;  Service: Cardiovascular;  Laterality: N/A;   CARDIOVERSION  04/25/2012   Procedure: CARDIOVERSION;  Surgeon: Vesta Mixer, MD;  Location: [facility] ENDOSCOPY;  Service: Cardiovascular;  Laterality: N/A;   CARDIOVERSION  04/25/2012   Procedure: CARDIOVERSION;  Surgeon: Pricilla Riffle, MD;  Location: [facility] OR;  Service:  Cardiovascular;  Laterality: N/A;   CARDIOVERSION  05/09/2012   Procedure: CARDIOVERSION;  Surgeon: Tonny Bollman, MD;  Location:  [facility] OR;  Service: Cardiovascular;  Laterality: N/A;  changed from crenshaw to cooper by trish/leone-endo   CARDIOVERSION N/A 03/15/2015   Procedure: CARDIOVERSION;  Surgeon: Vesta Mixer, MD;  Location: [facility] ENDOSCOPY;  Service: Cardiovascular;  Laterality: N/A;   COLONOSCOPY     COLONOSCOPY WITH PROPOFOL N/A 10/21/2014   Procedure: COLONOSCOPY WITH PROPOFOL;  Surgeon: Meryl Dare, MD;  Location: [facility] ENDOSCOPY;  Service: Endoscopy;  Laterality: N/A;   EP IMPLANTABLE DEVICE N/A 04/06/2015   Procedure: Loop Recorder Insertion;  Surgeon: Marinus Maw, MD;  Location: [facility] INVASIVE CV LAB;  Service: Cardiovascular;  Laterality: N/A;   ESOPHAGOGASTRODUODENOSCOPY     JOINT REPLACEMENT     LOOP RECORDER REMOVAL N/A 09/18/2017   Procedure: LOOP RECORDER REMOVAL;  Surgeon: Duke Salvia, MD;  Location: [facility] INVASIVE CV LAB;  Service: Cardiovascular;  Laterality: N/A;   RIGHT/LEFT HEART CATH AND CORONARY ANGIOGRAPHY N/A 01/28/2017   Procedure: Right/Left Heart Cath and Coronary Angiography;  Surgeon: Laurey Morale, MD;  Location: [facility] INVASIVE CV LAB;  Service: Cardiovascular;  Laterality: N/A;   TEE WITHOUT CARDIOVERSION  04/25/2012   Procedure: TRANSESOPHAGEAL ECHOCARDIOGRAM (TEE);  Surgeon: Vesta Mixer, MD;  Location: [facility] ENDOSCOPY;  Service: Cardiovascular;  Laterality: N/A;   TEE WITHOUT CARDIOVERSION N/A 03/15/2015   Procedure: TRANSESOPHAGEAL ECHOCARDIOGRAM (TEE);  Surgeon: Vesta Mixer, MD;  Location: [facility] ENDOSCOPY;  Service: Cardiovascular;  Laterality: N/A;   TONSILLECTOMY AND ADENOIDECTOMY     TOTAL KNEE ARTHROPLASTY Right 06/15/2014   Procedure: TOTAL KNEE ARTHROPLASTY;  Surgeon: Sheral Apley, MD;  Location: [facility] OR;  Service: Orthopedics;  Laterality: Right;   TOTAL KNEE ARTHROPLASTY Left 10/17/2021   Procedure: Left TOTAL KNEE ARTHROPLASTY;  Surgeon: Kathryne Hitch, MD;  Location: [facility] OR;  Service: Orthopedics;  Laterality: Left;    reports that he quit smoking about 15 years ago. His smoking use included cigarettes. He started smoking about 45 years ago. He has a 60 pack-year smoking history. He has never used smokeless tobacco. He reports that he does not drink alcohol and does not use drugs. family history includes Allergies in his mother; Asthma in his maternal grandmother and mother; COPD in his mother; Colon polyps in his mother; Hypothyroidism in his mother. Allergies  Allergen Reactions   Tape Other (See Comments)    SKIN TEARS VERY, VERY EASILY!!!! Use Paper Tape Only, PLEASE!!!   Amiodarone  Other (See Comments)    Caused hyperthyroidism    Statins Other (See Comments)    Myalgias    Sulfa Antibiotics Other (See Comments)    Mouth ulcers   Wound Dressing Adhesive Other (See Comments)    Skin Tears Use Paper Tape Only   Current Outpatient Medications on File Prior to Visit  Medication Sig Dispense Refill   acetaminophen (TYLENOL) 500 MG tablet Take 500-1,000 mg by mouth every 6 (six) hours as needed for mild pain or headache.     albuterol (VENTOLIN HFA) 108 (90 Base) MCG/ACT inhaler Inhale 1-2 puffs into the lungs every 6 (six) hours as needed for wheezing or shortness of breath. 18 g 5   ALPRAZolam (XANAX) 0.5 MG tablet TAKE 2 TABLETS (1 MG TOTAL) BY MOUTH EVERY DAY AT BEDTIME 60 tablet 5   ammonium lactate (AMLACTIN) 12 % cream Apply 1 Application topically as needed for dry skin. 385 g 3   apixaban (ELIQUIS) 5 MG TABS tablet Take 1 tablet (5 mg total) by mouth 2 (two) times daily. 60 tablet 11   augmented betamethasone dipropionate (DIPROLENE-AF) 0.05 % cream Apply 1 application  topically daily as needed (to affected areas- for itching).     carisoprodol (SOMA) 350 MG tablet TAKE 1 TABLET BY MOUTH THREE TIMES A DAY AS NEEDED FOR MUSCLE SPASM 90 tablet 2   carvedilol (COREG) 3.125 MG tablet Take 1 tablet (3.125 mg total) by  mouth 2 (two) times daily. 180 tablet 1   Cholecalciferol (VITAMIN D3) 1000 units CAPS Take 1,000 Units by mouth daily.     clotrimazole-betamethasone (LOTRISONE) cream Apply 1 Application topically daily. 30 g 0   cyanocobalamin (VITAMIN B12) 1000 MCG tablet Take 1,000 mcg by mouth daily.     doxepin (SINEQUAN) 10 MG capsule TAKE 2 CAPSULES BY MOUTH AT BEDTIME AS NEEDED. 180 capsule 1   DULoxetine (CYMBALTA) 60 MG capsule TAKE 1 CAPSULE BY MOUTH EVERY DAY 90 capsule 2   Dupilumab (DUPIXENT) 300 MG/2ML SOPN Inject 300 mg into the skin every 14 (fourteen) days.     eplerenone (INSPRA) 25 MG tablet Take 1 tablet (25 mg total) by mouth daily. 30 tablet 8   erythromycin ophthalmic ointment Place 1 Application into the right eye 3 (three) times daily. 3.5 g 0   Evolocumab (REPATHA SURECLICK) 140 MG/ML SOAJ Inject 140 mg into the skin every 14 (fourteen) days. 6 mL 3   FARXIGA 10 MG TABS tablet Take 10 mg by mouth every morning.     fluticasone (FLONASE) 50 MCG/ACT nasal spray PLACE 2 SPRAYS INTO BOTH NOSTRILS DAILY AS NEEDED FOR ALLERGIES OR RHINITIS. 48 mL 1   fluticasone-salmeterol (WIXELA INHUB) 250-50 MCG/ACT AEPB Inhale 1 puff into the lungs in the morning and at bedtime. 60 each 11   hydrOXYzine (ATARAX) 10 MG tablet TAKE 1 TABLET BY MOUTH 3 TIMES DAILY AS NEEDED FOR ITCHING. Strength: 10 mg 60 tablet 2   ipratropium-albuterol (DUONEB) 0.5-2.5 (3) MG/3ML SOLN TAKE 3 ML BY NEBULIZATION EVERY 4 HOURS AS NEEDED (WHEEZING). 360 mL 0   KLOR-CON M20 20 MEQ tablet TAKE 1 TABLET BY MOUTH EVERY DAY 90 tablet 3   losartan (COZAAR) 25 MG tablet Take 1 tablet (25 mg total) by mouth daily. 90 tablet 3   omeprazole (PRILOSEC) 20 MG capsule TAKE 1 CAPSULE BY MOUTH TWICE A DAY (Patient taking differently: Take 20 mg by mouth daily.) 180 capsule 1   ondansetron (ZOFRAN) 4 MG tablet Take 1 tablet (4 mg  total) by mouth every 6 (six) hours as needed for nausea. 40 tablet 0   predniSONE (DELTASONE) 10 MG tablet TAKE  1 TABLET (10 MG TOTAL) BY MOUTH DAILY WITH BREAKFAST. 30 tablet 0   promethazine (PHENERGAN) 25 MG tablet TAKE 1 TABLET BY MOUTH EVERY 8 HOURS AS NEEDED FOR NAUSEA OR VOMITING. 30 tablet 0   Respiratory Therapy Supplies (FULL KIT NEBULIZER SET) MISC 1 each by Does not apply route as needed. 1 each 0   Semaglutide,0.25 or 0.5MG /DOS, 2 MG/3ML SOPN Inject 0.25 mg once weekly for 4 weeks then increase to 0.5 mg weekly for 6 weeks 3 mL 1   silver sulfADIAZINE (SILVADENE) 1 % cream APPLY TOPICALLY TO AFFECTED AREA EVERY DAY 50 g 3   tamsulosin (FLOMAX) 0.4 MG CAPS capsule TAKE 1 CAPSULE BY MOUTH EVERY DAY 90 capsule 3   torsemide (DEMADEX) 20 MG tablet Take 60 mg by mouth 2 (two) times daily.     traZODone (DESYREL) 100 MG tablet TAKE 2 TABLETS BY MOUTH AT BEDTIME AS NEEDED 180 tablet 1   TUMS 500 MG chewable tablet Chew 500-2,000 mg by mouth every 4 (four) hours as needed for indigestion or heartburn.     valACYclovir (VALTREX) 1000 MG tablet TAKE 1 TABLET BY MOUTH THREE TIMES A DAY FOR 7 DAYS 21 tablet 0   venlafaxine XR (EFFEXOR XR) 150 MG 24 hr capsule Take 1 capsule (150 mg total) by mouth daily with breakfast. 90 capsule 3   zolpidem (AMBIEN) 10 MG tablet TAKE 1 TABLET BY MOUTH EVERY DAY AT BEDTIME AS NEEDED 90 tablet 1   No current facility-administered medications on file prior to visit.        ROS:  All others reviewed and negative.  Objective        PE:  BP 132/76 (BP Location: Left Arm, Patient Position: Sitting, Cuff Size: Large)   Pulse 82   Temp 97.8 F (36.6 C) (Temporal)   Ht 5\' 10"  (1.778 m)   Wt 270 lb (122.5 kg)   SpO2 96%   BMI 38.74 kg/m                 Constitutional: Pt appears in NAD               HENT: Head: NCAT.                Right Ear: External ear normal.                 Left Ear: External ear normal.                Eyes: . Pupils are equal, round, and reactive to light. Conjunctivae and EOM are normal               Nose: without d/c or deformity                Neck: Neck supple. Gross normal ROM               Cardiovascular: Normal rate and regular rhythm.                 Pulmonary/Chest: Effort normal and breath sounds without rales or wheezing.                Abd:  Soft, NT, ND, + BS, no organomegaly               Neurological: Pt is alert. At baseline orientation, motor grossly  intact               Skin: Skin is warm. LE edema - chornic trace bilateral, large somwhat wet bad odor candidal rash to bilateral groin and scrotum.  Right anterior knee with 2 cm area red, tender, swelling surrounding the laceration with stitches, wound intact               Psychiatric: Pt behavior is normal without agitation   Micro: none  Cardiac tracings I have personally interpreted today:  none  Pertinent Radiological findings (summarize): none   Lab Results  Component Value Date   WBC 10.5 05/15/2023   HGB 12.3 (L) 05/15/2023   HCT 38.6 (L) 05/15/2023   PLT 226 05/15/2023   GLUCOSE 97 05/15/2023   CHOL 164 12/31/2022   TRIG 96.0 12/31/2022   HDL 65.80 12/31/2022   LDLDIRECT 72.0 02/28/2021   LDLCALC 79 12/31/2022   ALT 29 05/14/2023   AST 20 05/14/2023   NA 138 05/15/2023   K 3.8 05/15/2023   CL 99 05/15/2023   CREATININE 0.99 05/15/2023   BUN 29 (H) 05/15/2023   CO2 27 05/15/2023   TSH 0.99 12/31/2022   PSA 3.02 12/31/2022   INR 1.0 05/14/2023   HGBA1C 5.8 12/31/2022   MICROALBUR 1.5 12/31/2022   Assessment/Plan:  [name] is a 67 y.o. White or Caucasian [1] male with  has a past medical history of AICD (automatic cardioverter/defibrillator) present, Anemia, ANXIETY, Arthritis, Asthma, Cardiomyopathy (HCC), Chronic constipation, COPD (chronic obstructive pulmonary disease) (HCC), COVID-19 (12/09/2019), DEPRESSION, Diverticulitis, DYSKINESIA, ESOPHAGUS, Essential hypertension, FIBROMYALGIA, GERD (gastroesophageal reflux disease), Glaucoma, HYPERLIPIDEMIA, INSOMNIA, Myocardial infarction (HCC), O2 dependent, Paroxysmal atrial fibrillation  (HCC), Peripheral neuropathy, Pneumonia (12/2016), Rash and other nonspecific skin eruption (04/12/2009), SLEEP APNEA, OBSTRUCTIVE, Syncope, and Type II diabetes mellitus (HCC).  Closed C7 fracture without spinal cord injury [facility]) Ok for refill hydrocodone prn pain, f/u NS aug 28 as planned  Intertrigo Now worsening itchy red rash to large area bilateral groin and scotal  - for difucan 100 every day x 7, and nystatin topical asd  Type 2 diabetes mellitus (HCC) Lab Results  Component Value Date   HGBA1C 5.8 12/31/2022   Stable, pt to continue current medical treatment farxiga 10 mg every day, ozempic 0.25 mg weekly   Cellulitis of right lower extremity without foot Right anterior knee only, stitches to remain intact to avoid dehiscence, for cleocin 300 tid, f/u next wk  Leg wound, left No evidence for cellulitis but has quite large mid left leg wound from skin tearing with good flap seating , no bleeding but may need debridement - for wound clinic referral  Followup: Return in about 6 weeks (around 07/01/2023), or if symptoms worsen or fail to improve.  [name], MD 05/25/2023 3:58 PM [facility] Internal Medicine

## 2023-05-24 ENCOUNTER — Telehealth: Payer: Self-pay | Admitting: Internal Medicine

## 2023-05-24 ENCOUNTER — Encounter: Payer: Self-pay | Admitting: Family Medicine

## 2023-05-24 ENCOUNTER — Ambulatory Visit (INDEPENDENT_AMBULATORY_CARE_PROVIDER_SITE_OTHER): Payer: Medicare HMO | Admitting: Family Medicine

## 2023-05-24 ENCOUNTER — Other Ambulatory Visit: Payer: Self-pay

## 2023-05-24 VITALS — BP 110/68 | HR 80 | Ht 70.0 in | Wt 280.0 lb

## 2023-05-24 DIAGNOSIS — S12690A Other displaced fracture of seventh cervical vertebra, initial encounter for closed fracture: Secondary | ICD-10-CM | POA: Diagnosis not present

## 2023-05-24 DIAGNOSIS — G8929 Other chronic pain: Secondary | ICD-10-CM

## 2023-05-24 DIAGNOSIS — M19012 Primary osteoarthritis, left shoulder: Secondary | ICD-10-CM

## 2023-05-24 DIAGNOSIS — M25512 Pain in left shoulder: Secondary | ICD-10-CM

## 2023-05-24 DIAGNOSIS — M19011 Primary osteoarthritis, right shoulder: Secondary | ICD-10-CM | POA: Diagnosis not present

## 2023-05-24 DIAGNOSIS — M25511 Pain in right shoulder: Secondary | ICD-10-CM

## 2023-05-24 MED ORDER — AMBULATORY NON FORMULARY MEDICATION: 1.0000 [IU] | Freq: Once | 0 refills | Status: AC

## 2023-05-24 MED ORDER — KETOCONAZOLE 2 % EX CREA: 1.0000 | TOPICAL_CREAM | Freq: Every day | CUTANEOUS | 0 refills | Status: DC

## 2023-05-24 NOTE — Assessment & Plan Note (Signed)
Discussed with patient at great length.  Not wearing the brace like he is supposed to.  Did write him for a soft brace.  Encouraged him to follow-up as well with neurosurgery which she states he has 1 next week.  Patient knows if worsening symptoms to seek medical attention immediately.

## 2023-05-24 NOTE — Telephone Encounter (Signed)
Patient called and his insurance will not pay for the nystatin powder that was prescribed yesterday.  Please send something else in.

## 2023-05-24 NOTE — Patient Instructions (Addendum)
AC joint injections Soft cervical collar prescription See you again in 2 months  [facility] [address] [place], [place] [address] [phone]  [facility]-Does not file insurance [address] [place], [place] [address] [phone]

## 2023-05-24 NOTE — Telephone Encounter (Signed)
Ok this is changed, [employer]

## 2023-05-24 NOTE — Assessment & Plan Note (Addendum)
Bilateral injections May 24, 2023.  Worsening pain, chronic condition.  Discussed with him that with the neck as well I am concerned that this is some radicular symptoms.  Patient's neck is in more of a flexed state with a right-sided sidebending.  Discussed with patient about icing regimen and home exercises.  Discussed with patient to monitor closely.  Do not feel that other injections would be as beneficial right now and if worsening symptoms need to consider looking closer to the neck.  Patient has good grip strength at the moment but if this seems to start giving him difficulty to seek medical attention immediately.

## 2023-05-25 ENCOUNTER — Encounter: Payer: Self-pay | Admitting: Internal Medicine

## 2023-05-25 DIAGNOSIS — S81802A Unspecified open wound, left lower leg, initial encounter: Secondary | ICD-10-CM | POA: Insufficient documentation

## 2023-05-25 NOTE — Assessment & Plan Note (Signed)
Right anterior knee only, stitches to remain intact to avoid dehiscence, for cleocin 300 tid, f/u next wk

## 2023-05-25 NOTE — Assessment & Plan Note (Signed)
Now worsening itchy red rash to large area bilateral groin and scotal  - for difucan 100 every day x 7, and nystatin topical asd

## 2023-05-25 NOTE — Assessment & Plan Note (Signed)
Lab Results  Component Value Date   HGBA1C 5.8 12/31/2022   Stable, pt to continue current medical treatment farxiga 10 mg every day, ozempic 0.25 mg weekly

## 2023-05-25 NOTE — Assessment & Plan Note (Signed)
No evidence for cellulitis but has quite large mid left leg wound from skin tearing with good flap seating , no bleeding but may need debridement - for wound clinic referral

## 2023-05-25 NOTE — Assessment & Plan Note (Signed)
Ok for refill hydrocodone prn pain, f/u NS aug 28 as planned

## 2023-05-27 ENCOUNTER — Ambulatory Visit: Payer: Medicare HMO | Admitting: Podiatry

## 2023-05-30 ENCOUNTER — Encounter (INDEPENDENT_AMBULATORY_CARE_PROVIDER_SITE_OTHER): Payer: Self-pay

## 2023-06-03 ENCOUNTER — Ambulatory Visit: Payer: Medicare HMO | Attending: Internal Medicine

## 2023-06-03 DIAGNOSIS — I5022 Chronic systolic (congestive) heart failure: Secondary | ICD-10-CM

## 2023-06-03 DIAGNOSIS — Z95810 Presence of automatic (implantable) cardiac defibrillator: Secondary | ICD-10-CM | POA: Diagnosis not present

## 2023-06-05 ENCOUNTER — Other Ambulatory Visit: Payer: Self-pay | Admitting: Internal Medicine

## 2023-06-06 NOTE — Telephone Encounter (Signed)
Patient called back and would like to know why this was denied.  Please call patient.  He is still in pain.

## 2023-06-06 NOTE — Progress Notes (Signed)
EPIC Encounter for ICM Monitoring  Patient Name: [name] is a 67 y.o. male Date: 06/06/2023 Primary Care Physican: [name], MD Primary Cardiologist: Shirlee Latch Electrophysiologist: Joycelyn Schmid Pacing: 99.7%    10/02/2022 Weight: 265 lbs 04/17/2023 Weight: 266 lbs     Spoke with patient and heart failure questions reviewed.  Transmission results reviewed.  Pt asymptomatic for fluid accumulation.  Reports feeling well at this time and voices no complaints.     Optivol thoracic impedance suggesting normal fluid levels since 8/13 with the exception of possible fluid accumulation from 7/28-8/12.   Prescribed:  Torsemide 20 mg 3 tablet(s) (60 mg total) by mouth twice day.  He asked Dr Shirlee Latch if could take 4 tablets in the morning and 2 tablets in the evening and was approved per patient.   Potassium 20 mEq take 1 tablet (20 mEq total) daily   Labs: 05/15/2023 Creatinine 0.99, BUN 29, Potassium 3.8, Sodium 138, GFR >60 05/14/2023 Creatinine 1.23, BUN 37, Potassium 4.2, Sodium 138, GFR >60 03/22/2023 Creatinine 1.16, BUN 20, Potassium 4.0, Sodium 138, GFR >60 02/18/2023 Creatinine 1.20, BUN 23, Potassium 4.4, Sodium 135, GFR >60 01/25/2023 Creatinine 1.22, BUN 37, Potassium 3.6, Sodium 138, GFR >60 01/15/2023 Creatinine 1.38, BUN 22, Potassium 4.6, Sodium 134, GFR 52.70  12/31/2022 Creatinine 1.27, BUN 36, Potassium 5.2, Sodium 136, GFR 58.24  12/18/2022 Creatinine 1.13, BUN 36, Potassium 4.5, Sodium 135  11/24/2022 Creatinine 1.45, BUN 45, Potassium 3.2, Sodium 135, GFR 53 (12:09 PM) 11/24/2022 Creatinine 1.59, BUN 51, Potassium 4.0, Sodium 138, GFR 47 (6:01 AM) A complete set of results can be found in Results Review.   Recommendations:   Recommendation to limit salt intake to 2000 mg daily and fluid intake to 64 oz daily.  Encouraged to call if experiencing any fluid symptoms.    Follow-up plan: ICM clinic phone appointment on 07/08/2023.   91 day device clinic remote transmission  07/24/2023.   EP/Cardiology Office Visits:    Recall 11/28/2023 with Dr Graciela Husbands.  06/12/2023 with HF clinic.   Copy of ICM check sent to Dr. Graciela Husbands.    3 month ICM trend: 06/03/2023.    12-14 Month ICM trend:     Karie Soda, RN 06/06/2023 10:35 AM

## 2023-06-11 ENCOUNTER — Ambulatory Visit: Payer: Medicare HMO | Admitting: Podiatry

## 2023-06-12 ENCOUNTER — Encounter (HOSPITAL_COMMUNITY): Payer: Self-pay | Admitting: Cardiology

## 2023-06-12 ENCOUNTER — Encounter: Payer: Self-pay | Admitting: Internal Medicine

## 2023-06-12 ENCOUNTER — Ambulatory Visit (HOSPITAL_COMMUNITY)
Admission: RE | Admit: 2023-06-12 | Discharge: 2023-06-12 | Disposition: A | Discharge status: Home / Self Care | Payer: Medicare HMO | Source: Ambulatory Visit | Attending: Family Medicine | Admitting: Family Medicine

## 2023-06-12 ENCOUNTER — Encounter (HOSPITAL_COMMUNITY): Payer: Self-pay

## 2023-06-12 VITALS — BP 112/84 | HR 76 | Wt 276.8 lb

## 2023-06-12 DIAGNOSIS — Z9981 Dependence on supplemental oxygen: Secondary | ICD-10-CM | POA: Diagnosis present

## 2023-06-12 DIAGNOSIS — G894 Chronic pain syndrome: Secondary | ICD-10-CM

## 2023-06-12 DIAGNOSIS — I5022 Chronic systolic (congestive) heart failure: Secondary | ICD-10-CM | POA: Diagnosis not present

## 2023-06-12 DIAGNOSIS — L988 Other specified disorders of the skin and subcutaneous tissue: Secondary | ICD-10-CM | POA: Insufficient documentation

## 2023-06-12 DIAGNOSIS — G4733 Obstructive sleep apnea (adult) (pediatric): Secondary | ICD-10-CM

## 2023-06-12 DIAGNOSIS — S81802S Unspecified open wound, left lower leg, sequela: Secondary | ICD-10-CM

## 2023-06-12 DIAGNOSIS — R9431 Abnormal electrocardiogram [ECG] [EKG]: Secondary | ICD-10-CM | POA: Insufficient documentation

## 2023-06-12 DIAGNOSIS — I2510 Atherosclerotic heart disease of native coronary artery without angina pectoris: Secondary | ICD-10-CM | POA: Diagnosis not present

## 2023-06-12 DIAGNOSIS — I442 Atrioventricular block, complete: Secondary | ICD-10-CM | POA: Diagnosis not present

## 2023-06-12 DIAGNOSIS — Z87891 Personal history of nicotine dependence: Secondary | ICD-10-CM | POA: Insufficient documentation

## 2023-06-12 DIAGNOSIS — I428 Other cardiomyopathies: Secondary | ICD-10-CM | POA: Diagnosis not present

## 2023-06-12 DIAGNOSIS — Z9181 History of falling: Secondary | ICD-10-CM | POA: Diagnosis not present

## 2023-06-12 DIAGNOSIS — J449 Chronic obstructive pulmonary disease, unspecified: Secondary | ICD-10-CM | POA: Diagnosis not present

## 2023-06-12 DIAGNOSIS — I4821 Permanent atrial fibrillation: Secondary | ICD-10-CM | POA: Diagnosis not present

## 2023-06-12 MED ORDER — TORSEMIDE 20 MG PO TABS: 80.0000 mg | ORAL_TABLET | Freq: Two times a day (BID) | ORAL | 2 refills | Status: DC

## 2023-06-12 MED ORDER — POTASSIUM CHLORIDE CRYS ER 20 MEQ PO TBCR: EXTENDED_RELEASE_TABLET | ORAL | 3 refills | Status: DC

## 2023-06-12 NOTE — Patient Instructions (Signed)
Medication Changes:  STOP FARXIGA   INCREASE TORSEMIDE TO 80MG  TWICE DAILY   INCREASE POTASSIUM TO (2 TABS) IN THE MORNING AND (1 TAB) IN THE EVENING  Lab Work:  RETURN FOR LABS IN 7-10 DAYS AS SCHEDULED   Follow-Up in: 3.-4 MONTHS AS SCHEDULED WITH DR. Shirlee Latch   At the [facility], you and your health needs are our priority. We have a designated team specialized in the treatment of Heart Failure. This Care Team includes your primary Heart Failure Specialized Cardiologist (physician), Advanced Practice Providers (APPs- Physician Assistants and Nurse Practitioners), and Pharmacist who all work together to provide you with the care you need, when you need it.   You may see any of the following providers on your designated Care Team at your next follow up:  Dr. Arvilla Meres Dr. Marca Ancona Dr. Marcos Eke, NP Robbie Lis, [place] [facility] [place], [place] Brynda Peon, NP Karle Plumber, PharmD   Please be sure to bring in all your medications bottles to every appointment.   Need to Contact us:  If you have any questions or concerns before your next appointment please send [place] a message through [place] or call our office at [phone].    TO LEAVE A MESSAGE FOR THE NURSE SELECT OPTION 2, PLEASE LEAVE A MESSAGE INCLUDING: YOUR NAME DATE OF BIRTH CALL BACK NUMBER REASON FOR CALL**this is important as we prioritize the call backs  YOU WILL RECEIVE A CALL BACK THE SAME DAY AS LONG AS YOU CALL BEFORE 4:00 PM

## 2023-06-12 NOTE — Progress Notes (Signed)
ID:  ANTOHNY [name], DOB 1955/11/07, MRN [id number]  Provider location: [facility] Type of Visit: Established patient  PCP:  [name], MD  Cardiologist:  Dr. Shirlee Latch   History of Present Illness: [name] is a 67 y.o. male who has history of COPD on home oxygen, permanent atrial fibrillation, and chronic systolic CHF.  Amiodarone and DCCV were tried for atrial fibrillation in the past without success.   Patient has been noted to have a low EF since 2013.  EF improved to normal by 2015 but dropped to 30-35% by 11/16.  Cath in 6/17 showed some CAD but not enough to cause cardiomyopathy.  He was admitted in 3/18 with PNA, atrial fibrillation/RVR, and acute on chronic systolic CHF.  Echo in 3/18 showed fall in EF to 10-15%.   Underwent right and left heart cath in 4/18, which showed nonobstructive CAD and relatively optimized filling pressures.    Syncope in 11/18. Loop recorder showed 6 second pause and periods of complete heart block. He had Medtronic BiV ICD placed in 12/18.  In 1/19, he had AV nodal ablation to promote BiV pacing.   Echo was done 6/19 with EF 25-30% with moderate RV dilation/mildly decreased systolic function. CPX 7/19 was submaximal, probably mild functional impairment.    Echo in 1/21 showed EF 35-40% with diffuse hypokinesis, moderate RV dilation with mildly decreased systolic function.   He was admitted with COVID-19 PNA in 2/19.  He had AKI/hypotension and Entresto was stopped.   Echo in 3/22 showed EF 40% with diffuse hypokinesis, mild RV enlargement, mildly decreased RV systolic function.   Presented to [facility] 7/22 with weakness and dizziness. Found to be in hemorrhagic shock from acute GIB. Hgb 5.0, INR 8.4. He was on Coumadin for atrial fibrillation. He was resuscitated with PRBCs, IVF, and given Vitamin K. His Entresto, beta blocker and torsemide were held due to hypotension. He initially required ICU monitoring due to hypotension  requiring pressors. GI team was consulted and planned to treat with oral antibiotics for suspected diverticular bleed with plans for outpatient colonoscopy.  Coumadin was transitioned to Eliquis.  He was discharged back on beta blocker, Entresto and SGLT2i. Dig, spiro and torsemide were held at discharge. Weight 259 lbs.  S/p TKR 1/23  Follow up 12/23, NYHA II-early III, volume overloaded and torsemide increased to 80 bid.   Echo 1/24 EF 40%, RV normal, no significant change from prior.  Admitted 2/24 w/ RLE cellulitis, failed outpatient abx. Received IV eventually transitioned to oral cefadroxil. Followed by ID, saw [name] 12/18/22 and arranged for CT of RLE to rule out deep tissue abscess. Finished abx course.  Admitted 7/24 with C7 fracture after fall at home. NS consulted and recommended C collar and outpatient follow up.   Today he returns for HF follow up with his daughter. Overall feeling more SOB, he is unsure if this is COPD or fluid related. He is SOB walking on flat ground, this is his baseline. Continues with chronic pain, more so since his fall. Denies palpitations, CP, dizziness, edema, or PND/Orthopnea. Chronically sleeps in recliner. Appetite ok. No fever or chills. Weight at home 277 pounds. Taking all medications. Unable to tolerate CPAP. [facility]. He is being treated for bilateral groin rash.  MDT device interrogation (personally reviewed): 99.7% BiV pacing, stable thoracic impedance, no VT, 0.1 hr/day activity.   ECG (personally reviewed): AF with BiV pacing  Labs (1/19):  K 4.4, creatinine 0.75, pro-BNP 433 Labs (2/19): digoxin 0.4 Labs (3/19): LDL 121, hgb 12.8, K 4.5, creatinine 0.98 Labs (6/19): LDL 48, HDL 45, K 5, creatinine 0.94, digoxin 0.3 Labs (9/19): K 3.7, creatinine 1.09 => 1.32, digoxin 0.2 Labs (11/19): LDL 115 Labs (12/20): K 3.9, creatinine 0.87, LDL 94  Labs (3/21): K 4, creatinine 1.08 Labs (6/21): LDL 109 Labs  (7/21): K 3.7, creatinine 1.15 Labs (12/21): LDL 129, TGs 239, hgb 12.9, K 3.9, creatinine 0.98, digoxin 0.8 Labs (1/22): ALT 76, AST 40, LDL 162, K 3.8 =>4.6, creatinine 1.16 => 1.12, digoxin 0.7, repeat LFTs normal Labs (5/22): LDL 72, TGs 335, K 4.2, creatinine 1.29 Labs (7/22): K 4.0, creatinine 0.77 Labs (8/22): K 4.0, creatinine 1.11 Labs (9/22): K 3.7, creatinine 1.14, hgb 11.7 Labs (3/23): LDL 28, TGs 132, LFTs normal, K 4.3, creatinine 1.0 Labs (6/23): K 4.4, creatinine 1.37 Labs (2/24): K 3.2, creatinine 1.45 Labs (3/24): K 4.4, creatinine 1.13, LDL 79 Labs (4/24): K 4.6, creatinine 1.38  Labs (5/24): K 4.4, creatinine 1.2 Labs (7/24): K 3.8, creatinine 0.99  PMH: 1. Asthma 2. COPD: On home oxygen. Prior smoker.  3. Atrial fibrillation: Permanent.  He failed DCCV and amiodarone in the past . 4. HTN 5. GERD 6. Hyperlipidemia: Myalgias with atorvastatin 7. BPH.  8. Chronic systolic CHF: Nonischemic cardiomyopathy.  - EF 25% by TEE in 7/13.  Normalized on 2015 echo. - Echo (11/16): EF 30-35%.  - LHC/RHC (6/17): 80% ostial stenosis small D1, 30-40% pLAD.  Mean RA 4, PA 26/10, mean PCWP 11, CI 2.33.  - Echo (3/18): EF 10-15%, diffuse hypokinesis, severe LV dilation, moderate RV dilation with severely decreased systolic function.  - LHC/RHC (4/18): 40% proximal LAD.  Mean RA 8, PA 37/10 mean 22, mean PCWP 22, LVEDP 9, CI 2.39. - Medtronic BiV ICD placement in 12/18 with AV nodal ablation to promote BiV pacing in 1/19.  - Echo (6/19): EF 25-30% with mild LV dilation, moderately dilated RV with mildly decreased systolic function, IVC not dilated, PASP 23 mmHg.  - CPX (7/19): peak VO2 17.6 (78% predicted), VE/VCO2 slope 31, RER 1.02 => submaximal, probably mild functional impairment.  - Echo (1/21): EF 35-40% with diffuse hypokinesis, moderate RV dilation with mildly decreased systolic function. - Echo (3/22): EF 40% with diffuse hypokinesis, mild RV enlargement, mildly decreased RV  systolic function.  - Echo (1/24): EF 40%, RV normal 9. Morbid obesity 10. ILR in place.  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 Complete heart block: s/p Medtronic CRT-D.  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 OSA: Untreated, unable to tolerate CPAP.  15. COVID-19 PNA 2/21.  16. Hyperlipidemia: Myalgias with multiple statins.  17. PAD: ABIs 10/21 noncompressible on right but TBI normal, normal ABI and TBI on left.  18. GI bleeding: 7/22, diverticulosis.  19. Left TKR (1/23)  Social History   Socioeconomic History   Marital status: Divorced    Spouse name: Not on file   Number of children: 2   Years of education: Not on file   Highest education level: Not on file  Occupational History   Occupation: retired/disabled. prev worked in Teacher, English as a foreign language.    Employer: DISABLED  Tobacco Use   Smoking status: Former    Current packs/day: 0.00    Average packs/day: 2.0 packs/day for 30.0 years (60.0 ttl pk-yrs)    Types: Cigarettes    Start date: 10/15/1977    Quit date: 10/16/2007    Years since quitting: 15.6   Smokeless tobacco: Never  Vaping Use   Vaping status: Never Used  Substance and Sexual Activity   Alcohol use: No   Drug use: No   Sexual activity: Not Currently  Other Topics Concern   Not on file  Social History Narrative   Lives alone.   Social Determinants of Health   Financial Resource Strain: Low Risk  (06/29/2022)   Overall Financial Resource Strain (CARDIA)    Difficulty of Paying Living Expenses: Not hard at all  Food Insecurity: No Food Insecurity (11/26/2022)   Hunger Vital Sign    Worried About Running Out of Food in the Last Year: Never true    Ran Out of Food in the Last Year: Never true  Transportation Needs: No Transportation Needs (11/26/2022)   PRAPARE - Administrator, Civil Service (Medical): No    Lack of Transportation (Non-Medical): No  Physical Activity: Inactive (06/29/2022)   Exercise Vital Sign    Days of Exercise per Week: 0 days     Minutes of Exercise per Session: 0 min  Stress: No Stress Concern Present (06/29/2022)   [employer] of Occupational Health - Occupational Stress Questionnaire    Feeling of Stress : Not at all  Social Connections: Moderately Integrated (06/29/2022)   Social Connection and Isolation Panel [NHANES]    Frequency of Communication with Friends and Family: More than three times a week    Frequency of Social Gatherings with Friends and Family: More than three times a week    Attends Religious Services: More than 4 times per year    Active Member of [employer] or Organizations: Yes    Attends Engineer, structural: More than 4 times per year    Marital Status: Divorced  Intimate Partner Violence: Not At Risk (11/22/2022)   Humiliation, Afraid, Rape, and Kick questionnaire    Fear of Current or Ex-Partner: No    Emotionally Abused: No    Physically Abused: No    Sexually Abused: No   Family History  Problem Relation Age of Onset   COPD Mother    Asthma Mother    Colon polyps Mother    Allergies Mother    Hypothyroidism Mother    Asthma Maternal Grandmother    Colon cancer Neg Hx    ROS: All systems reviewed and negative except as per HPI.   Current Outpatient Medications  Medication Sig Dispense Refill   acetaminophen (TYLENOL) 500 MG tablet Take 500-1,000 mg by mouth every 6 (six) hours as needed for mild pain or headache.     albuterol (VENTOLIN HFA) 108 (90 Base) MCG/ACT inhaler Inhale 1-2 puffs into the lungs every 6 (six) hours as needed for wheezing or shortness of breath. 18 g 5   ALPRAZolam (XANAX) 0.5 MG tablet TAKE 2 TABLETS (1 MG TOTAL) BY MOUTH EVERY DAY AT BEDTIME 60 tablet 5   ammonium lactate (AMLACTIN) 12 % cream Apply 1 Application topically as needed for dry skin. 385 g 3   apixaban (ELIQUIS) 5 MG TABS tablet Take 1 tablet (5 mg total) by mouth 2 (two) times daily. 60 tablet 11   augmented betamethasone dipropionate (DIPROLENE-AF) 0.05 % cream Apply 1 application   topically daily as needed (to affected areas- for itching).     carisoprodol (SOMA) 350 MG tablet TAKE 1 TABLET BY MOUTH THREE TIMES A DAY AS NEEDED FOR MUSCLE SPASM 90 tablet 2   carvedilol (COREG) 3.125 MG tablet Take 1 tablet (3.125 mg total) by mouth 2 (two) times daily. 180  tablet 1   Cholecalciferol (VITAMIN D3) 1000 units CAPS Take 1,000 Units by mouth daily.     clindamycin (CLEOCIN) 300 MG capsule Take 1 capsule (300 mg total) by mouth 3 (three) times daily. 30 capsule 0   clotrimazole-betamethasone (LOTRISONE) cream Apply 1 Application topically daily. 30 g 0   cyanocobalamin (VITAMIN B12) 1000 MCG tablet Take 1,000 mcg by mouth daily.     doxepin (SINEQUAN) 10 MG capsule TAKE 2 CAPSULES BY MOUTH AT BEDTIME AS NEEDED. 180 capsule 1   DULoxetine (CYMBALTA) 60 MG capsule TAKE 1 CAPSULE BY MOUTH EVERY DAY 90 capsule 2   Dupilumab (DUPIXENT) 300 MG/2ML SOPN Inject 300 mg into the skin every 14 (fourteen) days.     eplerenone (INSPRA) 25 MG tablet Take 1 tablet (25 mg total) by mouth daily. 30 tablet 8   erythromycin ophthalmic ointment Place 1 Application into the right eye 3 (three) times daily. 3.5 g 0   Evolocumab (REPATHA SURECLICK) 140 MG/ML SOAJ Inject 140 mg into the skin every 14 (fourteen) days. 6 mL 3   FARXIGA 10 MG TABS tablet Take 10 mg by mouth every morning.     fluconazole (DIFLUCAN) 100 MG tablet Take 1 tablet (100 mg total) by mouth daily. 7 tablet 0   fluticasone (FLONASE) 50 MCG/ACT nasal spray PLACE 2 SPRAYS INTO BOTH NOSTRILS DAILY AS NEEDED FOR ALLERGIES OR RHINITIS. 48 mL 1   fluticasone-salmeterol (WIXELA INHUB) 250-50 MCG/ACT AEPB Inhale 1 puff into the lungs in the morning and at bedtime. 60 each 11   hydrOXYzine (ATARAX) 10 MG tablet TAKE 1 TABLET BY MOUTH 3 TIMES DAILY AS NEEDED FOR ITCHING. Strength: 10 mg 60 tablet 2   ipratropium-albuterol (DUONEB) 0.5-2.5 (3) MG/3ML SOLN TAKE 3 ML BY NEBULIZATION EVERY 4 HOURS AS NEEDED (WHEEZING). 360 mL 0   ketoconazole  (NIZORAL) 2 % cream Apply 1 Application topically daily. 60 g 0   KLOR-CON M20 20 MEQ tablet TAKE 1 TABLET BY MOUTH EVERY DAY 90 tablet 3   losartan (COZAAR) 25 MG tablet Take 1 tablet (25 mg total) by mouth daily. 90 tablet 3   nystatin (MYCOSTATIN/NYSTOP) powder Use as directed twice per day as needed to the affected area 60 g 2   omeprazole (PRILOSEC) 20 MG capsule TAKE 1 CAPSULE BY MOUTH TWICE A DAY (Patient taking differently: Take 20 mg by mouth daily.) 180 capsule 1   ondansetron (ZOFRAN) 4 MG tablet Take 1 tablet (4 mg total) by mouth every 6 (six) hours as needed for nausea. 40 tablet 0   promethazine (PHENERGAN) 25 MG tablet TAKE 1 TABLET BY MOUTH EVERY 8 HOURS AS NEEDED FOR NAUSEA OR VOMITING. 30 tablet 0   Respiratory Therapy Supplies (FULL KIT NEBULIZER SET) MISC 1 each by Does not apply route as needed. 1 each 0   silver sulfADIAZINE (SILVADENE) 1 % cream APPLY TOPICALLY TO AFFECTED AREA EVERY DAY 50 g 3   tamsulosin (FLOMAX) 0.4 MG CAPS capsule TAKE 1 CAPSULE BY MOUTH EVERY DAY 90 capsule 3   torsemide (DEMADEX) 20 MG tablet Take 60 mg by mouth 2 (two) times daily.     traMADol (ULTRAM) 50 MG tablet Take 1 tablet (50 mg total) by mouth daily as needed. 30 tablet 5   traZODone (DESYREL) 100 MG tablet TAKE 2 TABLETS BY MOUTH AT BEDTIME AS NEEDED 180 tablet 1   TUMS 500 MG chewable tablet Chew 500-2,000 mg by mouth every 4 (four) hours as needed for indigestion or heartburn.  valACYclovir (VALTREX) 1000 MG tablet TAKE 1 TABLET BY MOUTH THREE TIMES A DAY FOR 7 DAYS (Patient taking differently: As needed) 21 tablet 0   venlafaxine XR (EFFEXOR XR) 150 MG 24 hr capsule Take 1 capsule (150 mg total) by mouth daily with breakfast. 90 capsule 3   zolpidem (AMBIEN) 10 MG tablet TAKE 1 TABLET BY MOUTH EVERY DAY AT BEDTIME AS NEEDED 90 tablet 1   No current facility-administered medications for this encounter.   Wt Readings from Last 3 Encounters:  06/12/23 125.6 kg (276 lb 12.8 oz)   05/24/23 127 kg (280 lb)  05/23/23 122.5 kg (270 lb)   BP 112/84   Pulse 76   Wt 125.6 kg (276 lb 12.8 oz)   SpO2 93%   BMI 39.72 kg/m   PHYSICAL EXAM: General:  NAD. No resp difficulty, walked into clinic with cane, kyphotic HEENT: Normal Neck: Supple. No JVD. Carotids 2+ bilat; no bruits. No lymphadenopathy or thryomegaly appreciated. Cor: PMI nondisplaced. Regular rate & rhythm. No rubs, gallops or murmurs. Lungs: Diminished throughout Abdomen: Soft, obese, nontender, nondistended. No hepatosplenomegaly. No bruits or masses. Good bowel sounds. Extremities: No cyanosis, clubbing, rash, trace BLE edema w/ venous stasis. Bandage to R knee and left shin Neuro: Alert & oriented x 3, cranial nerves grossly intact. Moves all 4 extremities w/o difficulty. Affect pleasant.  Assessment/Plan:  1. Atrial fibrillation: Permanent.  Now s/p AV nodal ablation to allow effective BiV pacing.  - Continue Eliquis 5 mg bid. No bleeding issues. 2. COPD: No longer smoking.  Not using oxygen.  - followed by Dr. Everardo All  3. Chronic systolic CHF: Nonischemic cardiomyopathy based on 6/17 cath. However, he had a recent significant fall in EF from 30-35% to 10-15% on echo from 3/18.  LHC/RHC in 4/18 showed nonobstructive CAD and relatively optimized filling pressures.  He now has Medtronic CRT-D device with AV nodal ablation to allow BiV pacing.  Echo 6/19 with EF 25-30% with mildly decreased RV systolic function. CPX 7/19 was submaximal but likely only mild functional limitation from CHF.  Echo in 1/21 showed EF 35-40%, echo in 3/22 and again in 1/24 showed EF up to 40% with mildly decreased RV systolic function.  Probably NYHA class IIb, confounded by body habitus and physical deconditioning. He is not volume overloaded by Optivol or exam.  - With ongoing intertrigo, stop Farxiga. - Increase torsemide to 80 mg bid, increase KCL to 40/20. Recent labs stable, K 3.8, SCr 0.99. BMET in 7-10 days. - Continue  losartan 25 mg daily. Did not tolerate Entresto due to AKI and hypotension  - Continue Inspra 25 mg daily.  - Continue Coreg 3.125 mg bid. 4. CAD: Nonobstructive on 4/18 cath. No exertional chest pain.  - No ASA with Eliquis use. - Statin myalgias, continue Repatha. Good LDL in 3/24 (LDL 79).  5. Complete heart block: Now has Medtronic CRT-D device and s/p AV nodal ablation.  6. OSA: Unable to tolerate CPAP.   7. Chronic LE wounds: [facility] follow [facility].  Follow up in 3-4 months with Dr. Shirlee Latch  Signed, Jacklynn Ganong, FNP  06/12/2023  [facility] [address] [facility] [place] [address] [phone] (office) [phone] (fax)

## 2023-06-12 NOTE — Telephone Encounter (Signed)
@   pharmacy team Please review eliquis for PA

## 2023-06-13 ENCOUNTER — Other Ambulatory Visit (HOSPITAL_COMMUNITY): Payer: Self-pay

## 2023-06-13 ENCOUNTER — Encounter (HOSPITAL_BASED_OUTPATIENT_CLINIC_OR_DEPARTMENT_OTHER): Payer: Self-pay | Admitting: Pulmonary Disease

## 2023-06-13 ENCOUNTER — Ambulatory Visit (HOSPITAL_BASED_OUTPATIENT_CLINIC_OR_DEPARTMENT_OTHER): Payer: Medicare HMO | Admitting: Pulmonary Disease

## 2023-06-13 VITALS — BP 110/72 | HR 71 | Resp 16 | Ht 70.0 in | Wt 276.1 lb

## 2023-06-13 DIAGNOSIS — G4733 Obstructive sleep apnea (adult) (pediatric): Secondary | ICD-10-CM | POA: Diagnosis not present

## 2023-06-13 DIAGNOSIS — G8929 Other chronic pain: Secondary | ICD-10-CM | POA: Insufficient documentation

## 2023-06-13 DIAGNOSIS — M542 Cervicalgia: Secondary | ICD-10-CM

## 2023-06-13 MED ORDER — SPIRIVA RESPIMAT 2.5 MCG/ACT IN AERS: 2.0000 | INHALATION_SPRAY | Freq: Two times a day (BID) | RESPIRATORY_TRACT | 11 refills | Status: DC

## 2023-06-13 MED ORDER — APIXABAN 5 MG PO TABS: 5.0000 mg | ORAL_TABLET | Freq: Two times a day (BID) | ORAL | 11 refills | Status: DC

## 2023-06-13 MED ORDER — SPIRIVA RESPIMAT 2.5 MCG/ACT IN AERS: 2.0000 | INHALATION_SPRAY | Freq: Every day | RESPIRATORY_TRACT | Status: DC

## 2023-06-13 NOTE — Progress Notes (Signed)
Subjective:   PATIENT ID: [name] GENDER: male DOB: [birth year], MRN: [id number]  Chief Complaint  Patient presents with   Follow-up    3 month fu, OSA- doesn't use CPAP. Breathing seems to be getting worse. Using dupixent and advair as prescribed and duoneb nebulizer 2 times a day    Reason for Visit: Follow-up  [name] is a 67 year old male former smoker with COPD, childhood asthma, OSA not on CPAP, DM2, HTN, atrial fib on eliquis, NICM s/p AICD, chronic diastolic heart failure who presents for follow-up.  He was recently treated for COPD exacerbation on 09/24/22. Treated with augmentin and doxycycline. On duonebs and albuterol. Referred to Pulmonary. Cough and wheezing has improved. He reports shortness of breath with walking a few steps. Denies chronic cough. Has baseline wheezing. Albuterol usually controls his symptoms. Currently on Dupixent which is managed by his dermatologist. Has been on it for two years and feels this helps his breathing. Before the pandemic [facility] [place] [facility] [place] [facility]   12/31/22 Since our last visit he has had worsening shortness of breath associated with exertional wheezing. Uses nebulizer twice a day. Compliant with Trelegy daily. Congestion has worsened with the pollen. Reports improved cough and wheezing overall but still persistent. Denies recent fevers, chills. He was treated for RLE cellulitis last month but also treated for heart failure while inpatient.   02/13/23 Since our last visit he was treated for exacerbation and feeling better after steroids. Continues to have shortness of breath, cough and occasional wheezing. He is not on Trelegy and using Duonebs twice a day. He uses albuterol twice a day on average. He initially did not want to walk or be evaluated for oxygen needs due to concern for price and not wanting to wear oxygen. Unable to afford Dupixent and does not qualify for assistance per patient with  Allergy.   06/13/23 Since our last visit he reports worsening shortness of breath. Stays at home and minimizes activity. Limited due to knee issues and LLE wound. When he exerts himself he feels weak, shaky and nauseated. Baseline cough and occasional wheezing unchanged. Complains of right sided neck swelling for >1 week and feels like it is affecting his breathing. Denies fevers or chills. Denies problems swallowing. Does have a preference to lean head to the right side  Social History: Quit in 2009. Former smoker 60 pack -years   Past Medical History:  Diagnosis Date   AICD (automatic cardioverter/defibrillator) present    Anemia    supposed to be taking Vit B but doesn't   ANXIETY    takes Xanax nightly   Arthritis    Asthma    Albuterol prn and Advair daily;also takes Prednisone daily   Cardiomyopathy (HCC)    a. EF 25% TEE July 2013; b. EF normalized 2015;  c. 03/2015 Echo: EF 40-45%, difrf HK, PASP 38 mmHg, Mild MR, sev LAE/RAE.   Chronic constipation    takes OTC stool softener   COPD (chronic obstructive pulmonary disease) (HCC)    "one dr says COPD; one dr says emphysema" (09/18/2017)   COVID-19 12/09/2019   DEPRESSION    takes Zoloft and Doxepin daily   Diverticulitis    DYSKINESIA, ESOPHAGUS    Essential hypertension        FIBROMYALGIA    GERD (gastroesophageal reflux disease)        Glaucoma    HYPERLIPIDEMIA    a. Intolerant to statins.  INSOMNIA    takes Ambien nightly   Myocardial infarction [facility])    a. 2012 Myoview notable for prior infarct;  b. 03/2015 Lexiscan CL: EF 37%, diff HK, small area of inferior infarct from apex to base-->Med Rx.   O2 dependent    "2.5L q hs & prn" (09/18/2017)   Paroxysmal atrial fibrillation (HCC)    a. CHA2DS2VASc = 3--> takes Coumadin;  b. 03/15/2015 Successful TEE/DCCV;  c. 03/2015 recurrent afib, Amio d/c'd in setting of hyperthyroidism.   Peripheral neuropathy    Pneumonia 12/2016   Rash and other nonspecific skin eruption  04/12/2009   no cause found saw dermatologists x 2 and allergist   SLEEP APNEA, OBSTRUCTIVE    a. doesn't use CPAP   Syncope    a. 03/2015 s/p MDT LINQ.   Type II diabetes mellitus (HCC)          Family History  Problem Relation Age of Onset   COPD Mother    Asthma Mother    Colon polyps Mother    Allergies Mother    Hypothyroidism Mother    Asthma Maternal Grandmother    Colon cancer Neg Hx      Social History   Occupational History   Occupation: retired/disabled. prev worked in Teacher, English as a foreign language.    Employer: DISABLED  Tobacco Use   Smoking status: Former    Current packs/day: 0.00    Average packs/day: 2.0 packs/day for 30.0 years (60.0 ttl pk-yrs)    Types: Cigarettes    Start date: 10/15/1977    Quit date: 10/16/2007    Years since quitting: 15.6   Smokeless tobacco: Never  Vaping Use   Vaping status: Never Used  Substance and Sexual Activity   Alcohol use: No   Drug use: No   Sexual activity: Not Currently    Allergies  Allergen Reactions   Tape Other (See Comments)    SKIN TEARS VERY, VERY EASILY!!!! Use Paper Tape Only, PLEASE!!!   Amiodarone Other (See Comments)    Caused hyperthyroidism    Statins Other (See Comments)    Myalgias    Sulfa Antibiotics Other (See Comments)    Mouth ulcers   Wound Dressing Adhesive Other (See Comments)    Skin Tears Use Paper Tape Only     Outpatient Medications Prior to Visit  Medication Sig Dispense Refill   acetaminophen (TYLENOL) 500 MG tablet Take 500-1,000 mg by mouth every 6 (six) hours as needed for mild pain or headache.     albuterol (VENTOLIN HFA) 108 (90 Base) MCG/ACT inhaler Inhale 1-2 puffs into the lungs every 6 (six) hours as needed for wheezing or shortness of breath. 18 g 5   ALPRAZolam (XANAX) 0.5 MG tablet TAKE 2 TABLETS (1 MG TOTAL) BY MOUTH EVERY DAY AT BEDTIME 60 tablet 5   ammonium lactate (AMLACTIN) 12 % cream Apply 1 Application topically as needed for dry skin. 385 g 3   apixaban (ELIQUIS)  5 MG TABS tablet Take 1 tablet (5 mg total) by mouth 2 (two) times daily. 60 tablet 11   augmented betamethasone dipropionate (DIPROLENE-AF) 0.05 % cream Apply 1 application  topically daily as needed (to affected areas- for itching).     carisoprodol (SOMA) 350 MG tablet TAKE 1 TABLET BY MOUTH THREE TIMES A DAY AS NEEDED FOR MUSCLE SPASM 90 tablet 2   carvedilol (COREG) 3.125 MG tablet Take 1 tablet (3.125 mg total) by mouth 2 (two) times daily. 180 tablet 1   Cholecalciferol (VITAMIN D3)  1000 units CAPS Take 1,000 Units by mouth daily.     clindamycin (CLEOCIN) 300 MG capsule Take 1 capsule (300 mg total) by mouth 3 (three) times daily. 30 capsule 0   clotrimazole-betamethasone (LOTRISONE) cream Apply 1 Application topically daily. 30 g 0   cyanocobalamin (VITAMIN B12) 1000 MCG tablet Take 1,000 mcg by mouth daily.     doxepin (SINEQUAN) 10 MG capsule TAKE 2 CAPSULES BY MOUTH AT BEDTIME AS NEEDED. 180 capsule 1   DULoxetine (CYMBALTA) 60 MG capsule TAKE 1 CAPSULE BY MOUTH EVERY DAY 90 capsule 2   Dupilumab (DUPIXENT) 300 MG/2ML SOPN Inject 300 mg into the skin every 14 (fourteen) days.     eplerenone (INSPRA) 25 MG tablet Take 1 tablet (25 mg total) by mouth daily. 30 tablet 8   erythromycin ophthalmic ointment Place 1 Application into the right eye 3 (three) times daily. 3.5 g 0   Evolocumab (REPATHA SURECLICK) 140 MG/ML SOAJ Inject 140 mg into the skin every 14 (fourteen) days. 6 mL 3   fluconazole (DIFLUCAN) 100 MG tablet Take 1 tablet (100 mg total) by mouth daily. 7 tablet 0   fluticasone (FLONASE) 50 MCG/ACT nasal spray PLACE 2 SPRAYS INTO BOTH NOSTRILS DAILY AS NEEDED FOR ALLERGIES OR RHINITIS. 48 mL 1   fluticasone-salmeterol (WIXELA INHUB) 250-50 MCG/ACT AEPB Inhale 1 puff into the lungs in the morning and at bedtime. 60 each 11   hydrOXYzine (ATARAX) 10 MG tablet TAKE 1 TABLET BY MOUTH 3 TIMES DAILY AS NEEDED FOR ITCHING. Strength: 10 mg 60 tablet 2   ipratropium-albuterol (DUONEB)  0.5-2.5 (3) MG/3ML SOLN TAKE 3 ML BY NEBULIZATION EVERY 4 HOURS AS NEEDED (WHEEZING). 360 mL 0   ketoconazole (NIZORAL) 2 % cream Apply 1 Application topically daily. 60 g 0   losartan (COZAAR) 25 MG tablet Take 1 tablet (25 mg total) by mouth daily. 90 tablet 3   nystatin (MYCOSTATIN/NYSTOP) powder Use as directed twice per day as needed to the affected area 60 g 2   omeprazole (PRILOSEC) 20 MG capsule TAKE 1 CAPSULE BY MOUTH TWICE A DAY (Patient taking differently: Take 20 mg by mouth daily.) 180 capsule 1   ondansetron (ZOFRAN) 4 MG tablet Take 1 tablet (4 mg total) by mouth every 6 (six) hours as needed for nausea. 40 tablet 0   potassium chloride SA (KLOR-CON M20) 20 MEQ tablet TAKE (2 TABLETS) IN THE MORNING, AND (1 TABLET) IN THE EVENING 90 tablet 3   promethazine (PHENERGAN) 25 MG tablet TAKE 1 TABLET BY MOUTH EVERY 8 HOURS AS NEEDED FOR NAUSEA OR VOMITING. 30 tablet 0   Respiratory Therapy Supplies (FULL KIT NEBULIZER SET) MISC 1 each by Does not apply route as needed. 1 each 0   silver sulfADIAZINE (SILVADENE) 1 % cream APPLY TOPICALLY TO AFFECTED AREA EVERY DAY 50 g 3   tamsulosin (FLOMAX) 0.4 MG CAPS capsule TAKE 1 CAPSULE BY MOUTH EVERY DAY 90 capsule 3   torsemide (DEMADEX) 20 MG tablet Take 4 tablets (80 mg total) by mouth 2 (two) times daily. 240 tablet 2   traMADol (ULTRAM) 50 MG tablet Take 1 tablet (50 mg total) by mouth daily as needed. 30 tablet 5   traZODone (DESYREL) 100 MG tablet TAKE 2 TABLETS BY MOUTH AT BEDTIME AS NEEDED 180 tablet 1   TUMS 500 MG chewable tablet Chew 500-2,000 mg by mouth every 4 (four) hours as needed for indigestion or heartburn.     valACYclovir (VALTREX) 1000 MG tablet TAKE  1 TABLET BY MOUTH THREE TIMES A DAY FOR 7 DAYS (Patient taking differently: As needed) 21 tablet 0   venlafaxine XR (EFFEXOR XR) 150 MG 24 hr capsule Take 1 capsule (150 mg total) by mouth daily with breakfast. 90 capsule 3   zolpidem (AMBIEN) 10 MG tablet TAKE 1 TABLET  BY MOUTH EVERY DAY AT BEDTIME AS NEEDED 90 tablet 1   No facility-administered medications prior to visit.    Review of Systems  Constitutional:  Positive for malaise/fatigue. Negative for chills, diaphoresis, fever and weight loss.  HENT:  Negative for congestion.   Respiratory:  Positive for shortness of breath. Negative for cough, hemoptysis, sputum production and wheezing.   Cardiovascular:  Negative for chest pain, palpitations and leg swelling.     Objective:   Vitals:   06/13/23 1456  BP: 110/72  Pulse: 71  Resp: 16  SpO2: 96%  Weight: 276 lb 2 oz (125.3 kg)  Height: 5\' 10"  (1.778 m)   SpO2: 96 %  Body mass index is 39.62 kg/m.  Physical Exam: General: Chronically ill-appearing, no acute distress HENT: [place], AT Neck: Right sided enlargement with mild tenderness to palpation but no palpable lymphadenopathy. Non-erythematous. No fluctuation. Neck with full ROM on command but leans head to right at rest. Eyes: EOMI, no scleral icterus Respiratory: Diminished to auscultation bilaterally.  No crackles, wheezing or rales Cardiovascular: RRR, -M/R/G, no JVD Extremities:-Edema,-tenderness Neuro: AAO x4, CNII-XII grossly intact Psych: Normal mood, normal affect  Data Reviewed:  Imaging: CXR 05/05/21 - Cardiomegaly. S/p biventricular ICD. Small bilateral pleural effusion CXR 11/29/22 - Cardiomegaly. S/p biventricular ICD. Resolved effusions CXR 01/25/23 - Cardiomegaly. S/p biventricular ICD. Bibasilar atelectasis  PFT: 01/27/14 FVC 3.35 (72%) FEV1 2.41 (68%) Ratio 65  TLC 94% DLCO 77% Interpretation: Moderate obstructive defect with mildly reduced DLCO. Borderline bronchodilator response in FEV1 present.  Labs: CBC    Component Value Date/Time   WBC 10.5 05/15/2023 0149   RBC 3.81 (L) 05/15/2023 0149   HGB 12.3 (L) 05/15/2023 0149   HGB 12.8 (L) 12/19/2017 1532   HCT 38.6 (L) 05/15/2023 0149   HCT 38.0 12/19/2017 1532   PLT 226 05/15/2023 0149   PLT 208  12/19/2017 1532   MCV 101.3 (H) 05/15/2023 0149   MCV 99 (H) 12/19/2017 1532   MCH 32.3 05/15/2023 0149   MCHC 31.9 05/15/2023 0149   RDW 13.3 05/15/2023 0149   RDW 14.0 12/19/2017 1532   LYMPHSABS 1.6 05/14/2023 0240   LYMPHSABS 1.9 12/19/2017 1532   MONOABS 1.1 (H) 05/14/2023 0240   EOSABS 0.0 05/14/2023 0240   EOSABS 0.3 12/19/2017 1532   BASOSABS 0.0 05/14/2023 0240   BASOSABS 0.0 12/19/2017 1532   BMET    Component Value Date/Time   NA 138 05/15/2023 0149   NA 141 01/25/2023 1358   K 3.8 05/15/2023 0149   CL 99 05/15/2023 0149   CO2 27 05/15/2023 0149   GLUCOSE 97 05/15/2023 0149   BUN 29 (H) 05/15/2023 0149   BUN 41 (H) 01/25/2023 1358   CREATININE 0.99 05/15/2023 0149   CREATININE 0.92 03/19/2016 1420   CALCIUM 8.7 (L) 05/15/2023 0149   EGFR 56 (L) 01/25/2023 1358   GFRNONAA >60 05/15/2023 0149     Abs eos 12/20/21 -100     Assessment & Plan:   Discussion: 67 year old male former smoker with COPD, childhood asthma, OSA not on CPAP, DM2, HTN, atrial fib on eliquis, NICM s/p AICD, chronic diastolic heart failure who presents for follow-up.  DOE is likely related to related to deconditioning, uncontrolled OSA/nocturnal hypoxemia and inadequately treated COPD. Discussed clinical course and management of COPD/asthma including optimizing bronchodilator regimen and action plan for exacerbation. Add LAMA to his regimen.  Counseled on benefit of oxygen support and CPAP if he qualifies. ONO and sleep test ordered   COPD-asthma overlap - not in exacerbation. Uncontrolled symptoms --CONTINUE Wixela 250-50 mcg ONE puff in the morning and evening --START Spiriva 2.5 mcg TWO puffs in the morning --CONTINUE Albuterol AS NEEDED for shortness of breath or wheezing --Encouraged aerobic exercise 30 minutes five days a week  History of OSA Hypoxemia Last sleep test before 2013 Ambulatory O2 on last visit with no desaturations. Patient does not wish to be evaluated --ORDER home  sleep study --ORDER overnight oximetry  Right neck swelling/tenderness Denies dysphagia. No s/sx of infection. Unclear timeline but seems to have worsened in the last week --ORDER CT Soft Tissue Neck  Health Maintenance Immunization History  Administered Date(s) Administered   Fluad Quad(high Dose 65+) 06/29/2022   Influenza Inj Mdck Quad With Preservative 07/22/2019   Influenza Split 07/09/2011, 07/23/2012   Influenza Whole 09/16/2006, 08/26/2007, 07/27/2008, 07/26/2009, 07/07/2010   Influenza, High Dose Seasonal PF 06/22/2020, 07/07/2021   Influenza,inj,Quad PF,6+ Mos 06/30/2013, 08/19/2014, 06/27/2015, 07/13/2016, 07/09/2017, 07/18/2018   PFIZER(Purple Top)SARS-COV-2 Vaccination 03/21/2020, 04/11/2020, 11/03/2020   Pneumococcal Conjugate-13 04/29/2017   Pneumococcal Polysaccharide-23 03/29/2010, 07/07/2010, 02/23/2015, 03/17/2021   Td 10/16/2003   Tdap 02/23/2015   CT Lung Screen - not qualified. Quit > 15 years ago  Orders Placed This Encounter  Procedures   CT SOFT TISSUE NECK W WO CONTRAST    Standing Status:   Future    Standing Expiration Date:   06/12/2024    Order Specific Question:   If indicated for the ordered procedure, I authorize the administration of contrast media per Radiology protocol    Answer:   Yes    Order Specific Question:   Does the patient have a contrast media/X-ray dye allergy?    Answer:   Yes    Order Specific Question:   Preferred imaging location?    Answer:   [facility]   Pulse oximetry, overnight    On room air    Standing Status:   Future    Standing Expiration Date:   06/12/2024   Home sleep test    Standing Status:   Future    Standing Expiration Date:   06/12/2024    Order Specific Question:   Where should this test be performed:    Answer:   [facility]   Meds ordered this encounter  Medications   DISCONTD: Tiotropium Bromide Monohydrate (SPIRIVA RESPIMAT) 2.5 MCG/ACT AERS    Sig: Inhale 2 puffs into the  lungs in the morning and at bedtime.    Dispense:  4 g    Refill:  11   Tiotropium Bromide Monohydrate (SPIRIVA RESPIMAT) 2.5 MCG/ACT AERS    Sig: Inhale 2 puffs into the lungs daily.    Order Specific Question:   Lot Number?    Answer:   [id number]    Order Specific Question:   Expiration Date?    Answer:   11/14/2024    Order Specific Question:   NDC    Answer:   9147-8295-62 [[id number]]    Order Specific Question:   Quantity    Answer:   2   Tiotropium Bromide Monohydrate (SPIRIVA RESPIMAT) 2.5 MCG/ACT AERS    Sig: Inhale 2 puffs into the  lungs in the morning and at bedtime.    Dispense:  4 g    Refill:  11    Return in about 3 months (around 09/13/2023).  I have spent a total time of 30-minutes on the day of the appointment including chart review, data review, collecting history, coordinating care and discussing medical diagnosis and plan with the patient/family. Past medical history, allergies, medications were reviewed. Pertinent imaging, labs and tests included in this note have been reviewed and interpreted independently by me   Cage Gupton Mechele Collin, MD [facility] 06/13/2023 3:06 PM  Office Number [phone]

## 2023-06-13 NOTE — Patient Instructions (Signed)
COPD-asthma overlap - not in exacerbation. Uncontrolled symptoms --CONTINUE Wixela 250-50 mcg ONE puff in the morning and evening --START Spiriva 2.5 mcg TWO puffs in the morning --CONTINUE Albuterol AS NEEDED for shortness of breath or wheezing --Encouraged aerobic exercise 30 minutes five days a week  History of OSA Hypoxemia Last sleep test before 2013 Ambulatory O2 on last visit with no desaturations. Patient does not wish to be evaluated --ORDER home sleep study --ORDER overnight oximetry  Right neck swelling/tenderness --ORDER CT Soft Tissue Neck

## 2023-06-19 ENCOUNTER — Ambulatory Visit (HOSPITAL_COMMUNITY)
Admission: RE | Admit: 2023-06-19 | Discharge: 2023-06-19 | Disposition: A | Discharge status: Home / Self Care | Payer: Medicare HMO | Source: Ambulatory Visit | Attending: Cardiology | Admitting: Cardiology

## 2023-06-19 DIAGNOSIS — G4730 Sleep apnea, unspecified: Secondary | ICD-10-CM | POA: Diagnosis not present

## 2023-06-19 DIAGNOSIS — R0902 Hypoxemia: Secondary | ICD-10-CM | POA: Diagnosis not present

## 2023-06-19 DIAGNOSIS — I5022 Chronic systolic (congestive) heart failure: Secondary | ICD-10-CM | POA: Insufficient documentation

## 2023-06-19 LAB — BASIC METABOLIC PANEL
Anion gap: 10 (ref 5–15)
BUN: 21 mg/dL (ref 8–23)
CO2: 26 mmol/L (ref 22–32)
Calcium: 8.8 mg/dL — ABNORMAL LOW (ref 8.9–10.3)
Chloride: 98 mmol/L (ref 98–111)
Creatinine, Ser: 1.38 mg/dL — ABNORMAL HIGH (ref 0.61–1.24)
GFR, Estimated: 56 mL/min — ABNORMAL LOW (ref >60)
Glucose, Bld: 72 mg/dL (ref 70–99)
Potassium: 5 mmol/L (ref 3.5–5.1)
Sodium: 134 mmol/L — ABNORMAL LOW (ref 135–145)

## 2023-06-19 LAB — BRAIN NATRIURETIC PEPTIDE: B Natriuretic Peptide: 29.1 pg/mL (ref 0.0–100.0)

## 2023-06-20 DIAGNOSIS — S129XXA Fracture of neck, unspecified, initial encounter: Secondary | ICD-10-CM | POA: Diagnosis not present

## 2023-06-20 DIAGNOSIS — Z6841 Body Mass Index (BMI) 40.0 and over, adult: Secondary | ICD-10-CM | POA: Diagnosis not present

## 2023-06-22 ENCOUNTER — Ambulatory Visit (HOSPITAL_BASED_OUTPATIENT_CLINIC_OR_DEPARTMENT_OTHER)
Admission: RE | Admit: 2023-06-22 | Discharge: 2023-06-22 | Disposition: A | Discharge status: Home / Self Care | Payer: Medicare HMO | Source: Ambulatory Visit | Attending: Pulmonary Disease | Admitting: Pulmonary Disease

## 2023-06-22 ENCOUNTER — Telehealth (HOSPITAL_BASED_OUTPATIENT_CLINIC_OR_DEPARTMENT_OTHER): Payer: Self-pay | Admitting: Pulmonary Disease

## 2023-06-22 DIAGNOSIS — M47819 Spondylosis without myelopathy or radiculopathy, site unspecified: Secondary | ICD-10-CM | POA: Diagnosis not present

## 2023-06-22 DIAGNOSIS — M542 Cervicalgia: Secondary | ICD-10-CM | POA: Insufficient documentation

## 2023-06-22 DIAGNOSIS — R937 Abnormal findings on diagnostic imaging of other parts of musculoskeletal system: Secondary | ICD-10-CM | POA: Diagnosis not present

## 2023-06-22 MED ORDER — IOHEXOL 300 MG/ML  SOLN
100.0000 mL | Freq: Once | INTRAMUSCULAR | Status: AC | PRN
  Administered 2023-06-22: 75 mL via INTRAVENOUS

## 2023-06-22 NOTE — Telephone Encounter (Signed)
[facility] After Hours Telephone Encounter  In the same telephone conversation regarding his CT neck, I also updated him on his ONO results from 06/19/23. No desaturations of <88% for greater than 5 min seen. No oxygen required.

## 2023-06-22 NOTE — Telephone Encounter (Signed)
[facility] After Hours Telephone Encounter  I contacted patient regarding CT Neck demonstrated subacute fracture through the anterior/inferior C3 vertebral body with extension to the C3-C4 joint space. In addition to his prior C7 fracture that he is already aware of. I let him know that I briefly reviewed the case with Neurosurgeon on call and that based on the imaging and my clinical exam on 06/13/23 of his neck, he would not require urgent evaluation for these findings. But do recommend a hard collar [employer]). Patient reports he received a collar in July 2024 for his prior C7 fracture so can restart wearing it tonight.   He reports that he would like to see a different Neurosurgeon than who he saw previously for his care. I will contact their clinic and see if I can arrange an appointment with Neurosurgery in the upcoming week for evaluation.

## 2023-06-23 ENCOUNTER — Other Ambulatory Visit (HOSPITAL_COMMUNITY): Payer: Self-pay | Admitting: Family Medicine

## 2023-06-24 ENCOUNTER — Other Ambulatory Visit: Payer: Self-pay | Admitting: Internal Medicine

## 2023-06-24 ENCOUNTER — Other Ambulatory Visit: Payer: Self-pay | Admitting: Family Medicine

## 2023-06-24 ENCOUNTER — Ambulatory Visit: Payer: Medicare HMO | Admitting: Podiatry

## 2023-06-24 DIAGNOSIS — E119 Type 2 diabetes mellitus without complications: Secondary | ICD-10-CM

## 2023-06-24 DIAGNOSIS — M79675 Pain in left toe(s): Secondary | ICD-10-CM

## 2023-06-24 DIAGNOSIS — I255 Ischemic cardiomyopathy: Secondary | ICD-10-CM

## 2023-06-24 DIAGNOSIS — L304 Erythema intertrigo: Secondary | ICD-10-CM

## 2023-06-24 DIAGNOSIS — B351 Tinea unguium: Secondary | ICD-10-CM | POA: Diagnosis not present

## 2023-06-24 DIAGNOSIS — I428 Other cardiomyopathies: Secondary | ICD-10-CM

## 2023-06-24 DIAGNOSIS — M79674 Pain in right toe(s): Secondary | ICD-10-CM

## 2023-06-24 DIAGNOSIS — I4819 Other persistent atrial fibrillation: Secondary | ICD-10-CM

## 2023-06-24 NOTE — Progress Notes (Signed)
  Subjective:  Patient ID: [name], male    DOB: 03/14/1956,  MRN: [id number]  No chief complaint on file.    67 y.o. male returns with the above complaint. History confirmed with patient.  His nails are thickened elongated and causing pain again.  He had a recent fall that has injured his neck and right thigh and left lower leg had to have sutures placed still healing  Objective:  Physical Exam: warm, good capillary refill, no trophic changes or ulcerative lesions, normal sensory exam, +2 DP and PT reduced bilateral and venous stasis dermatitis noted.  Moderate to severe hallux abductovalgus with hammertoe contractures 2 through 5 bilaterally, right worse than left.  Thickened elongated dystrophic mycotic nails x10 with pincer nail deformity.      Summary:  Right: Resting right ankle-brachial index indicates noncompressible right  lower extremity arteries. The right toe-brachial index is normal. ABIs are  unreliable.   Left: Resting left ankle-brachial index is within normal range. No  evidence of significant left lower extremity arterial disease. The left  toe-brachial index is normal.       *See table(s) above for measurements and observations.         Radiographs: X-ray of the right foot: Moderate hallux abductovalgus with the presence of metatarsus adductus deformity and pes planus, no increased change in alignment since last radiographs Assessment:   1. Pain due to onychomycosis of toenails of both feet   2. Type 2 diabetes mellitus without complication, without long-term current use of insulin (HCC)        Plan:  Patient was evaluated and treated and all questions answered.  Discussed the etiology and treatment options for the condition in detail with the patient. Educated patient on the topical and oral treatment options for mycotic nails. Recommended debridement of the nails today. Sharp and mechanical debridement performed of all painful and mycotic nails  today. Nails debrided in length and thickness using a nail nipper to level of comfort. Discussed treatment options including appropriate shoe gear. Follow up as needed for painful nails.  Ingrowing corners of hallux nails debrided and slant back fashion with lidocaine cream    Return in about 3 months (around 09/23/2023) for at risk diabetic foot care.

## 2023-06-25 DIAGNOSIS — G4730 Sleep apnea, unspecified: Secondary | ICD-10-CM | POA: Diagnosis not present

## 2023-06-27 ENCOUNTER — Encounter: Payer: Self-pay | Admitting: Cardiology

## 2023-07-01 ENCOUNTER — Encounter: Payer: Self-pay | Admitting: Internal Medicine

## 2023-07-01 ENCOUNTER — Other Ambulatory Visit: Payer: Self-pay | Admitting: Internal Medicine

## 2023-07-01 ENCOUNTER — Telehealth: Payer: Self-pay | Admitting: Internal Medicine

## 2023-07-01 ENCOUNTER — Ambulatory Visit (INDEPENDENT_AMBULATORY_CARE_PROVIDER_SITE_OTHER): Payer: Medicare HMO | Admitting: Internal Medicine

## 2023-07-01 VITALS — BP 128/76 | HR 75 | Temp 97.7°F | Ht 70.0 in | Wt 285.0 lb

## 2023-07-01 DIAGNOSIS — E1165 Type 2 diabetes mellitus with hyperglycemia: Secondary | ICD-10-CM

## 2023-07-01 DIAGNOSIS — D649 Anemia, unspecified: Secondary | ICD-10-CM

## 2023-07-01 DIAGNOSIS — Z125 Encounter for screening for malignant neoplasm of prostate: Secondary | ICD-10-CM

## 2023-07-01 DIAGNOSIS — Z23 Encounter for immunization: Secondary | ICD-10-CM | POA: Diagnosis not present

## 2023-07-01 DIAGNOSIS — E875 Hyperkalemia: Secondary | ICD-10-CM

## 2023-07-01 DIAGNOSIS — N1831 Chronic kidney disease, stage 3a: Secondary | ICD-10-CM | POA: Diagnosis not present

## 2023-07-01 DIAGNOSIS — D509 Iron deficiency anemia, unspecified: Secondary | ICD-10-CM | POA: Diagnosis not present

## 2023-07-01 DIAGNOSIS — E538 Deficiency of other specified B group vitamins: Secondary | ICD-10-CM

## 2023-07-01 DIAGNOSIS — S12201D Unspecified nondisplaced fracture of third cervical vertebra, subsequent encounter for fracture with routine healing: Secondary | ICD-10-CM

## 2023-07-01 DIAGNOSIS — E559 Vitamin D deficiency, unspecified: Secondary | ICD-10-CM

## 2023-07-01 DIAGNOSIS — S12200A Unspecified displaced fracture of third cervical vertebra, initial encounter for closed fracture: Secondary | ICD-10-CM | POA: Insufficient documentation

## 2023-07-01 LAB — HEPATIC FUNCTION PANEL
ALT: 15 U/L (ref 0–53)
AST: 20 U/L (ref 0–37)
Albumin: 3.7 g/dL (ref 3.5–5.2)
Alkaline Phosphatase: 74 U/L (ref 39–117)
Bilirubin, Direct: 0.1 mg/dL (ref 0.0–0.3)
Total Bilirubin: 0.4 mg/dL (ref 0.2–1.2)
Total Protein: 6.4 g/dL (ref 6.0–8.3)

## 2023-07-01 LAB — CBC WITH DIFFERENTIAL/PLATELET
Basophils Absolute: 0.1 10*3/uL (ref 0.0–0.1)
Basophils Relative: 0.9 % (ref 0.0–3.0)
Eosinophils Absolute: 0.6 10*3/uL (ref 0.0–0.7)
Eosinophils Relative: 5.6 % — ABNORMAL HIGH (ref 0.0–5.0)
HCT: 40.9 % (ref 39.0–52.0)
Hemoglobin: 12.9 g/dL — ABNORMAL LOW (ref 13.0–17.0)
Lymphocytes Relative: 22.5 % (ref 12.0–46.0)
Lymphs Abs: 2.4 10*3/uL (ref 0.7–4.0)
MCHC: 31.5 g/dL (ref 30.0–36.0)
MCV: 100.4 fl — ABNORMAL HIGH (ref 78.0–100.0)
Monocytes Absolute: 1 10*3/uL (ref 0.1–1.0)
Monocytes Relative: 9.6 % (ref 3.0–12.0)
Neutro Abs: 6.6 10*3/uL (ref 1.4–7.7)
Neutrophils Relative %: 61.4 % (ref 43.0–77.0)
Platelets: 271 10*3/uL (ref 150.0–400.0)
RBC: 4.07 Mil/uL — ABNORMAL LOW (ref 4.22–5.81)
RDW: 13.9 % (ref 11.5–15.5)
WBC: 10.7 10*3/uL — ABNORMAL HIGH (ref 4.0–10.5)

## 2023-07-01 LAB — URINALYSIS, ROUTINE W REFLEX MICROSCOPIC
Bilirubin Urine: NEGATIVE
Hgb urine dipstick: NEGATIVE
Ketones, ur: NEGATIVE
Leukocytes,Ua: NEGATIVE
Nitrite: NEGATIVE
RBC / HPF: NONE SEEN (ref 0–?)
Specific Gravity, Urine: 1.02 (ref 1.000–1.030)
Total Protein, Urine: NEGATIVE
Urine Glucose: NEGATIVE
Urobilinogen, UA: 0.2 (ref 0.0–1.0)
pH: 5.5 (ref 5.0–8.0)

## 2023-07-01 LAB — BASIC METABOLIC PANEL
BUN: 20 mg/dL (ref 6–23)
CO2: 29 mEq/L (ref 19–32)
Calcium: 9 mg/dL (ref 8.4–10.5)
Chloride: 106 mEq/L (ref 96–112)
Creatinine, Ser: 1.09 mg/dL (ref 0.40–1.50)
GFR: 69.72 mL/min (ref >60.00)
Glucose, Bld: 77 mg/dL (ref 70–99)
Potassium: 5.7 meq/L — ABNORMAL HIGH (ref 3.5–5.1)
Sodium: 142 meq/L (ref 135–145)

## 2023-07-01 LAB — LIPID PANEL
Cholesterol: 127 mg/dL (ref 0–200)
HDL: 55.7 mg/dL (ref >39.00)
LDL Cholesterol: 38 mg/dL (ref 0–99)
NonHDL: 70.83
Total CHOL/HDL Ratio: 2
Triglycerides: 163 mg/dL — ABNORMAL HIGH (ref 0.0–149.0)
VLDL: 32.6 mg/dL (ref 0.0–40.0)

## 2023-07-01 LAB — FERRITIN: Ferritin: 16.1 ng/mL — ABNORMAL LOW (ref 22.0–322.0)

## 2023-07-01 LAB — IBC PANEL
Iron: 64 ug/dL (ref 42–165)
Saturation Ratios: 15.4 % — ABNORMAL LOW (ref 20.0–50.0)
TIBC: 414.4 ug/dL (ref 250.0–450.0)
Transferrin: 296 mg/dL (ref 212.0–360.0)

## 2023-07-01 LAB — PSA: PSA: 3.45 ng/mL (ref 0.10–4.00)

## 2023-07-01 LAB — VITAMIN D 25 HYDROXY (VIT D DEFICIENCY, FRACTURES): VITD: 22.51 ng/mL — ABNORMAL LOW (ref 30.00–100.00)

## 2023-07-01 LAB — VITAMIN B12: Vitamin B-12: 752 pg/mL (ref 211–911)

## 2023-07-01 LAB — MICROALBUMIN / CREATININE URINE RATIO
Creatinine,U: 135.7 mg/dL
Microalb Creat Ratio: 1.1 mg/g (ref 0.0–30.0)
Microalb, Ur: 1.5 mg/dL (ref 0.0–1.9)

## 2023-07-01 LAB — TSH: TSH: 4.79 u[IU]/mL (ref 0.35–5.50)

## 2023-07-01 LAB — HEMOGLOBIN A1C: Hgb A1c MFr Bld: 5.9 % (ref 4.6–6.5)

## 2023-07-01 MED ORDER — HYDROCODONE-ACETAMINOPHEN 7.5-325 MG PO TABS: 1.0000 | ORAL_TABLET | Freq: Four times a day (QID) | ORAL | 0 refills | Status: DC | PRN

## 2023-07-01 MED ORDER — POTASSIUM CHLORIDE CRYS ER 20 MEQ PO TBCR: EXTENDED_RELEASE_TABLET | ORAL | 3 refills | Status: DC

## 2023-07-01 NOTE — Telephone Encounter (Signed)
Patient states called [facility] having issues changing doctors within the office and may need Dr.Ellisons help. Please advise and call patient back if needed.

## 2023-07-01 NOTE — Patient Instructions (Signed)
You had the flu shot today  Please take all new medication as prescribed  - the pain medicine  Please stop the goody powders and ibuprofen due to high risk of ulcers and bleeding  Please call for refills until you are able to follow up with rehab   Please continue all other medications as before, and refills have been done if requested.  Please have the pharmacy call with any other refills you may need.  Please continue your efforts at being more active, low cholesterol diet, and weight control.  Please keep your appointments with your specialists as you may have planned - Dr Wynetta Emery oct 16, dr Mabeline Caras late October  You will be contacted regarding the referral for: MRI for the neck  Please go to the LAB at the blood drawing area for the tests to be done  You will be contacted by phone if any changes need to be made immediately.  Otherwise, you will receive a letter about your results with an explanation, but please check with MyChart first.  Please make an Appointment to return in 6 months, or sooner if needed

## 2023-07-01 NOTE — Progress Notes (Unsigned)
Patient ID: [name], male   DOB: 1956/01/22, 67 y.o.   MRN: [id number]        Chief Complaint: follow up post traumatic fall with right knee cap wound healing, left distal leg wound healed, RLE cellulitis resolved, but new c3-4 compression fx with acute on chronic neck pain, iron def anemia       HPI:  [name] is a 67 y.o. male here better and worse - had fall with right knee wound now scaabbed and near resolved, left distal leg wound healed and resolved, RLE cellulitis resolved without further rednes pain ,swelling, but also s/p recent fall with c3-4 compression fx with persistent pain now taking multiple doses goody powders and ibuprofen as this is all he has, Denies worsening reflux, abd pain, dysphagia, n/v, bowel change or blood.  Pt denies chest pain, increased sob or doe, wheezing, orthopnea, PND, increased LE swelling, palpitations, dizziness or syncope.   Pt denies polydipsia, polyuria, or new focal neuro s/s.           Wt Readings from Last 3 Encounters:  07/01/23 285 lb (129.3 kg)  06/13/23 276 lb 2 oz (125.3 kg)  06/12/23 276 lb 12.8 oz (125.6 kg)   BP Readings from Last 3 Encounters:  07/01/23 128/76  06/13/23 110/72  06/12/23 112/84         Past Medical History:  Diagnosis Date   AICD (automatic cardioverter/defibrillator) present    Anemia    supposed to be taking Vit B but doesn't   ANXIETY    takes Xanax nightly   Arthritis    Asthma    Albuterol prn and Advair daily;also takes Prednisone daily   Cardiomyopathy (HCC)    a. EF 25% TEE July 2013; b. EF normalized 2015;  c. 03/2015 Echo: EF 40-45%, difrf HK, PASP 38 mmHg, Mild MR, sev LAE/RAE.   Chronic constipation    takes OTC stool softener   COPD (chronic obstructive pulmonary disease) (HCC)    "one dr says COPD; one dr says emphysema" (09/18/2017)   COVID-19 12/09/2019   DEPRESSION    takes Zoloft and Doxepin daily   Diverticulitis    DYSKINESIA, ESOPHAGUS    Essential hypertension         FIBROMYALGIA    GERD (gastroesophageal reflux disease)        Glaucoma    HYPERLIPIDEMIA    a. Intolerant to statins.   INSOMNIA    takes Ambien nightly   Myocardial infarction [facility])    a. 2012 Myoview notable for prior infarct;  b. 03/2015 Lexiscan CL: EF 37%, diff HK, small area of inferior infarct from apex to base-->Med Rx.   O2 dependent    "2.5L q hs & prn" (09/18/2017)   Paroxysmal atrial fibrillation (HCC)    a. CHA2DS2VASc = 3--> takes Coumadin;  b. 03/15/2015 Successful TEE/DCCV;  c. 03/2015 recurrent afib, Amio d/c'd in setting of hyperthyroidism.   Peripheral neuropathy    Pneumonia 12/2016   Rash and other nonspecific skin eruption 04/12/2009   no cause found saw dermatologists x 2 and allergist   SLEEP APNEA, OBSTRUCTIVE    a. doesn't use CPAP   Syncope    a. 03/2015 s/p MDT LINQ.   Type II diabetes mellitus (HCC)        Past Surgical History:  Procedure Laterality Date   ACNE CYST REMOVAL     2 on back    AV NODE ABLATION N/A 10/25/2017   Procedure: AV NODE  ABLATION;  Surgeon: Duke Salvia, MD;  Location: [facility] INVASIVE CV LAB;  Service: Cardiovascular;  Laterality: N/A;   BIV ICD INSERTION CRT-D N/A 09/18/2017   Procedure: BIV ICD INSERTION CRT-D;  Surgeon: Duke Salvia, MD;  Location: [facility] INVASIVE CV LAB;  Service: Cardiovascular;  Laterality: N/A;   CARDIAC CATHETERIZATION N/A 03/21/2016   Procedure: Right/Left Heart Cath and Coronary Angiography;  Surgeon: Laurey Morale, MD;  Location: [facility] INVASIVE CV LAB;  Service: Cardiovascular;  Laterality: N/A;   CARDIOVERSION  04/18/2012   Procedure: CARDIOVERSION;  Surgeon: Pricilla Riffle, MD;  Location: [facility] OR;  Service: Cardiovascular;  Laterality: N/A;   CARDIOVERSION  04/25/2012   Procedure: CARDIOVERSION;  Surgeon: Vesta Mixer, MD;  Location: [facility] ENDOSCOPY;  Service: Cardiovascular;  Laterality: N/A;   CARDIOVERSION  04/25/2012   Procedure: CARDIOVERSION;  Surgeon: Pricilla Riffle, MD;  Location: [facility] OR;  Service:  Cardiovascular;  Laterality: N/A;   CARDIOVERSION  05/09/2012   Procedure: CARDIOVERSION;  Surgeon: Tonny Bollman, MD;  Location: [facility] OR;  Service: Cardiovascular;  Laterality: N/A;  changed from crenshaw to cooper by trish/leone-endo   CARDIOVERSION N/A 03/15/2015   Procedure: CARDIOVERSION;  Surgeon: Vesta Mixer, MD;  Location: [facility] ENDOSCOPY;  Service: Cardiovascular;  Laterality: N/A;   COLONOSCOPY     COLONOSCOPY WITH PROPOFOL N/A 10/21/2014   Procedure: COLONOSCOPY WITH PROPOFOL;  Surgeon: Meryl Dare, MD;  Location: [facility] ENDOSCOPY;  Service: Endoscopy;  Laterality: N/A;   EP IMPLANTABLE DEVICE N/A 04/06/2015   Procedure: Loop Recorder Insertion;  Surgeon: Marinus Maw, MD;  Location: [facility] INVASIVE CV LAB;  Service: Cardiovascular;  Laterality: N/A;   ESOPHAGOGASTRODUODENOSCOPY     JOINT REPLACEMENT     LOOP RECORDER REMOVAL N/A 09/18/2017   Procedure: LOOP RECORDER REMOVAL;  Surgeon: Duke Salvia, MD;  Location: [facility] INVASIVE CV LAB;  Service: Cardiovascular;  Laterality: N/A;   RIGHT/LEFT HEART CATH AND CORONARY ANGIOGRAPHY N/A 01/28/2017   Procedure: Right/Left Heart Cath and Coronary Angiography;  Surgeon: Laurey Morale, MD;  Location: [facility] INVASIVE CV LAB;  Service: Cardiovascular;  Laterality: N/A;   TEE WITHOUT CARDIOVERSION  04/25/2012   Procedure: TRANSESOPHAGEAL ECHOCARDIOGRAM (TEE);  Surgeon: Vesta Mixer, MD;  Location: [facility] ENDOSCOPY;  Service: Cardiovascular;  Laterality: N/A;   TEE WITHOUT CARDIOVERSION N/A 03/15/2015   Procedure: TRANSESOPHAGEAL ECHOCARDIOGRAM (TEE);  Surgeon: Vesta Mixer, MD;  [facility] ENDOSCOPY;  Service: Cardiovascular;  Laterality: N/A;   TONSILLECTOMY AND ADENOIDECTOMY     TOTAL KNEE ARTHROPLASTY Right 06/15/2014   Procedure: TOTAL KNEE ARTHROPLASTY;  Surgeon: Sheral Apley, MD;  Location: [facility] OR;  Service: Orthopedics;  Laterality: Right;   TOTAL KNEE ARTHROPLASTY Left 10/17/2021   Procedure: Left TOTAL KNEE ARTHROPLASTY;  Surgeon: Kathryne Hitch, MD;  Location: [facility] OR;  Service: Orthopedics;  Laterality: Left;    reports that he quit smoking about 15 years ago. His smoking use included cigarettes. He started smoking about 45 years ago. He has a 60 pack-year smoking history. He has never used smokeless tobacco. He reports that he does not drink alcohol and does not use drugs. family history includes Allergies in his mother; Asthma in his maternal grandmother and mother; COPD in his mother; Colon polyps in his mother; Hypothyroidism in his mother. Allergies  Allergen Reactions   Tape Other (See Comments)    SKIN TEARS VERY, VERY EASILY!!!! Use Paper Tape Only, PLEASE!!!   Amiodarone Other (See Comments)    Caused hyperthyroidism  Statins Other (See Comments)    Myalgias    Sulfa Antibiotics Other (See Comments)    Mouth ulcers   Wound Dressing Adhesive Other (See Comments)    Skin Tears Use Paper Tape Only   Current Outpatient Medications on File Prior to Visit  Medication Sig Dispense Refill   acetaminophen (TYLENOL) 500 MG tablet Take 500-1,000 mg by mouth every 6 (six) hours as needed for mild pain or headache.     albuterol (VENTOLIN HFA) 108 (90 Base) MCG/ACT inhaler Inhale 1-2 puffs into the lungs every 6 (six) hours as needed for wheezing or shortness of breath. 18 g 5   ALPRAZolam (XANAX) 0.5 MG tablet TAKE 2 TABLETS (1 MG TOTAL) BY MOUTH EVERY DAY AT BEDTIME 60 tablet 5   ammonium lactate (AMLACTIN) 12 % cream Apply 1 Application topically as needed for dry skin. 385 g 3   apixaban (ELIQUIS) 5 MG TABS tablet Take 1 tablet (5 mg total) by mouth 2 (two) times daily. 60 tablet 11   augmented betamethasone dipropionate (DIPROLENE-AF) 0.05 % cream Apply 1 application  topically daily as needed (to affected areas- for itching).     carisoprodol (SOMA) 350 MG tablet TAKE 1 TABLET BY MOUTH THREE TIMES A DAY AS NEEDED FOR MUSCLE SPASM 90 tablet 2   carvedilol (COREG) 3.125 MG tablet Take 1 tablet (3.125 mg total) by  mouth 2 (two) times daily. 180 tablet 1   Cholecalciferol (VITAMIN D3) 1000 units CAPS Take 1,000 Units by mouth daily.     clindamycin (CLEOCIN) 300 MG capsule Take 1 capsule (300 mg total) by mouth 3 (three) times daily. 30 capsule 0   clotrimazole-betamethasone (LOTRISONE) cream Apply 1 Application topically daily. 30 g 0   cyanocobalamin (VITAMIN B12) 1000 MCG tablet Take 1,000 mcg by mouth daily.     doxepin (SINEQUAN) 10 MG capsule TAKE 2 CAPSULES BY MOUTH AT BEDTIME AS NEEDED. 180 capsule 1   DULoxetine (CYMBALTA) 60 MG capsule TAKE 1 CAPSULE BY MOUTH EVERY DAY 90 capsule 2   Dupilumab (DUPIXENT) 300 MG/2ML SOPN Inject 300 mg into the skin every 14 (fourteen) days.     eplerenone (INSPRA) 25 MG tablet Take 1 tablet (25 mg total) by mouth daily. 30 tablet 8   erythromycin ophthalmic ointment Place 1 Application into the right eye 3 (three) times daily. 3.5 g 0   Evolocumab (REPATHA SURECLICK) 140 MG/ML SOAJ Inject 140 mg into the skin every 14 (fourteen) days. 6 mL 3   fluconazole (DIFLUCAN) 100 MG tablet Take 1 tablet (100 mg total) by mouth daily. 7 tablet 0   fluticasone (FLONASE) 50 MCG/ACT nasal spray PLACE 2 SPRAYS INTO BOTH NOSTRILS DAILY AS NEEDED FOR ALLERGIES OR RHINITIS. 48 mL 1   fluticasone-salmeterol (WIXELA INHUB) 250-50 MCG/ACT AEPB Inhale 1 puff into the lungs in the morning and at bedtime. 60 each 11   hydrOXYzine (ATARAX) 10 MG tablet TAKE 1 TABLET BY MOUTH 3 TIMES DAILY AS NEEDED FOR ITCHING. Strength: 10 mg 60 tablet 2   ipratropium-albuterol (DUONEB) 0.5-2.5 (3) MG/3ML SOLN TAKE 3 ML BY NEBULIZATION EVERY 4 HOURS AS NEEDED (WHEEZING). 360 mL 0   ketoconazole (NIZORAL) 2 % cream Apply 1 Application topically daily. 60 g 0   losartan (COZAAR) 25 MG tablet Take 1 tablet (25 mg total) by mouth daily. 90 tablet 3   nystatin (MYCOSTATIN/NYSTOP) powder Use as directed twice per day as needed to the affected area 60 g 2   omeprazole (PRILOSEC) 20 MG capsule  TAKE 1 CAPSULE BY  MOUTH TWICE A DAY (Patient taking differently: Take 20 mg by mouth daily.) 180 capsule 1   ondansetron (ZOFRAN) 4 MG tablet Take 1 tablet (4 mg total) by mouth every 6 (six) hours as needed for nausea. 40 tablet 0   ondansetron (ZOFRAN-ODT) 4 MG disintegrating tablet Take by mouth.     promethazine (PHENERGAN) 25 MG tablet TAKE 1 TABLET BY MOUTH EVERY 8 HOURS AS NEEDED FOR NAUSEA OR VOMITING. 30 tablet 0   Respiratory Therapy Supplies (FULL KIT NEBULIZER SET) MISC 1 each by Does not apply route as needed. 1 each 0   silver sulfADIAZINE (SILVADENE) 1 % cream APPLY TOPICALLY TO AFFECTED AREA EVERY DAY 50 g 3   tamsulosin (FLOMAX) 0.4 MG CAPS capsule TAKE 1 CAPSULE BY MOUTH EVERY DAY 90 capsule 3   Tiotropium Bromide Monohydrate (SPIRIVA RESPIMAT) 2.5 MCG/ACT AERS Inhale 2 puffs into the lungs daily.     Tiotropium Bromide Monohydrate (SPIRIVA RESPIMAT) 2.5 MCG/ACT AERS Inhale 2 puffs into the lungs in the morning and at bedtime. 4 g 11   torsemide (DEMADEX) 20 MG tablet Take 4 tablets (80 mg total) by mouth 2 (two) times daily. 240 tablet 2   traMADol (ULTRAM) 50 MG tablet Take 1 tablet (50 mg total) by mouth daily as needed. 30 tablet 5   traZODone (DESYREL) 100 MG tablet TAKE 2 TABLETS BY MOUTH AT BEDTIME AS NEEDED 180 tablet 1   TUMS 500 MG chewable tablet Chew 500-2,000 mg by mouth every 4 (four) hours as needed for indigestion or heartburn.     valACYclovir (VALTREX) 1000 MG tablet TAKE 1 TABLET BY MOUTH THREE TIMES A DAY FOR 7 DAYS (Patient taking differently: As needed) 21 tablet 0   venlafaxine XR (EFFEXOR XR) 150 MG 24 hr capsule Take 1 capsule (150 mg total) by mouth daily with breakfast. 90 capsule 3   zolpidem (AMBIEN) 10 MG tablet TAKE 1 TABLET BY MOUTH EVERY DAY AT BEDTIME AS NEEDED 90 tablet 1   No current facility-administered medications on file prior to visit.        ROS:  All others reviewed and negative.  Objective        PE:  BP 128/76 (BP Location: Right Arm, Patient  Position: Sitting, Cuff Size: Normal)   Pulse 75   Temp 97.7 F (36.5 C) (Oral)   Ht 5\' 10"  (1.778 m)   Wt 285 lb (129.3 kg)   SpO2 96%   BMI 40.89 kg/m                 Constitutional: Pt appears in NAD               HENT: Head: NCAT.                Right Ear: External ear normal.                 Left Ear: External ear normal.                Eyes: . Pupils are equal, round, and reactive to light. Conjunctivae and EOM are normal               Nose: without d/c or deformity               Neck: Neck supple. But with torticollis like right head tilt               Cardiovascular: Normal rate and regular  rhythm.                 Pulmonary/Chest: Effort normal and breath sounds without rales or wheezing.                Abd:  Soft, NT, ND, + BS, no organomegaly               Neurological: Pt is alert. At baseline orientation, motor grossly intact               Skin: Skin is warm. No rashes, no other new lesions, LE edema - trace to 1+ bilateral               Psychiatric: Pt behavior is normal without agitation   Micro: none  Cardiac tracings I have personally interpreted today:  none  Pertinent Radiological findings (summarize): none   Lab Results  Component Value Date   WBC 10.7 (H) 07/01/2023   HGB 12.9 (L) 07/01/2023   HCT 40.9 07/01/2023   PLT 271.0 07/01/2023   GLUCOSE 77 07/01/2023   CHOL 127 07/01/2023   TRIG 163.0 (H) 07/01/2023   HDL 55.70 07/01/2023   LDLDIRECT 72.0 02/28/2021   LDLCALC 38 07/01/2023   ALT 15 07/01/2023   AST 20 07/01/2023   NA 142 07/01/2023   K 5.7 (H) 07/01/2023   CL 106 07/01/2023   CREATININE 1.09 07/01/2023   BUN 20 07/01/2023   CO2 29 07/01/2023   TSH 4.79 07/01/2023   PSA 3.45 07/01/2023   INR 1.0 05/14/2023   HGBA1C 5.9 07/01/2023   MICROALBUR 1.5 07/01/2023   Assessment/Plan:  [name] is a 67 y.o. White or Caucasian [1] male with  has a past medical history of AICD (automatic cardioverter/defibrillator) present, Anemia,  ANXIETY, Arthritis, Asthma, Cardiomyopathy (HCC), Chronic constipation, COPD (chronic obstructive pulmonary disease) (HCC), COVID-19 (12/09/2019), DEPRESSION, Diverticulitis, DYSKINESIA, ESOPHAGUS, Essential hypertension, FIBROMYALGIA, GERD (gastroesophageal reflux disease), Glaucoma, HYPERLIPIDEMIA, INSOMNIA, Myocardial infarction (HCC), O2 dependent, Paroxysmal atrial fibrillation (HCC), Peripheral neuropathy, Pneumonia (12/2016), Rash and other nonspecific skin eruption (04/12/2009), SLEEP APNEA, OBSTRUCTIVE, Syncope, and Type II diabetes mellitus (HCC).  Anemia With iron deficiency - for f/u lab today  Type 2 diabetes mellitus (HCC) Lab Results  Component Value Date   HGBA1C 5.9 07/01/2023   Stable, pt to continue current medical treatment  - diet, wt control   B12 deficiency Lab Results  Component Value Date   VITAMINB12 752 07/01/2023   Stable, cont oral replacement - b12 1000 mcg qd   Vitamin D deficiency Last vitamin D Lab Results  Component Value Date   VD25OH 22.51 (L) 07/01/2023   Low, to start oral replacement   C3 cervical fracture (HCC) With persistent pain and impairment, for MRI c spine, pain control, f/u NS oct 16 and rehab late oct, to d/c goody powder and ibuprofen due to GI risk  Followup: Return in about 6 months (around 12/29/2023).  [name], MD 07/04/2023 4:57 AM [facility]0

## 2023-07-01 NOTE — Telephone Encounter (Signed)
Pt is wondering if you could resend the Hydrocodone to the pharmacy again because the pharmacy is stating that they don't have it and it has been sent to another pharmacy.

## 2023-07-02 ENCOUNTER — Encounter (HOSPITAL_BASED_OUTPATIENT_CLINIC_OR_DEPARTMENT_OTHER): Payer: Medicare HMO | Attending: General Surgery | Admitting: General Surgery

## 2023-07-02 DIAGNOSIS — E11622 Type 2 diabetes mellitus with other skin ulcer: Secondary | ICD-10-CM | POA: Insufficient documentation

## 2023-07-02 DIAGNOSIS — L97822 Non-pressure chronic ulcer of other part of left lower leg with fat layer exposed: Secondary | ICD-10-CM | POA: Insufficient documentation

## 2023-07-02 DIAGNOSIS — E669 Obesity, unspecified: Secondary | ICD-10-CM | POA: Insufficient documentation

## 2023-07-02 DIAGNOSIS — Z8249 Family history of ischemic heart disease and other diseases of the circulatory system: Secondary | ICD-10-CM | POA: Insufficient documentation

## 2023-07-02 DIAGNOSIS — I252 Old myocardial infarction: Secondary | ICD-10-CM | POA: Insufficient documentation

## 2023-07-02 DIAGNOSIS — I428 Other cardiomyopathies: Secondary | ICD-10-CM | POA: Insufficient documentation

## 2023-07-02 DIAGNOSIS — Z950 Presence of cardiac pacemaker: Secondary | ICD-10-CM | POA: Insufficient documentation

## 2023-07-02 DIAGNOSIS — Z86718 Personal history of other venous thrombosis and embolism: Secondary | ICD-10-CM | POA: Insufficient documentation

## 2023-07-02 DIAGNOSIS — L97812 Non-pressure chronic ulcer of other part of right lower leg with fat layer exposed: Secondary | ICD-10-CM | POA: Diagnosis not present

## 2023-07-02 DIAGNOSIS — I4891 Unspecified atrial fibrillation: Secondary | ICD-10-CM | POA: Insufficient documentation

## 2023-07-02 DIAGNOSIS — Z9981 Dependence on supplemental oxygen: Secondary | ICD-10-CM | POA: Diagnosis not present

## 2023-07-02 DIAGNOSIS — Z87891 Personal history of nicotine dependence: Secondary | ICD-10-CM | POA: Diagnosis not present

## 2023-07-02 DIAGNOSIS — E1140 Type 2 diabetes mellitus with diabetic neuropathy, unspecified: Secondary | ICD-10-CM | POA: Insufficient documentation

## 2023-07-02 DIAGNOSIS — I5022 Chronic systolic (congestive) heart failure: Secondary | ICD-10-CM | POA: Insufficient documentation

## 2023-07-02 DIAGNOSIS — J4489 Other specified chronic obstructive pulmonary disease: Secondary | ICD-10-CM | POA: Diagnosis not present

## 2023-07-02 DIAGNOSIS — I872 Venous insufficiency (chronic) (peripheral): Secondary | ICD-10-CM | POA: Insufficient documentation

## 2023-07-02 LAB — PTH, INTACT AND CALCIUM
Calcium: 9.2 mg/dL (ref 8.6–10.3)
PTH: 59 pg/mL (ref 16–77)

## 2023-07-02 MED ORDER — HYDROCODONE-ACETAMINOPHEN 7.5-325 MG PO TABS: 1.0000 | ORAL_TABLET | Freq: Four times a day (QID) | ORAL | 0 refills | Status: DC | PRN

## 2023-07-02 NOTE — Progress Notes (Signed)
ARYA, HICKEN ([id number]) [id number] Page 1 of 4 Visit Report for 07/02/2023 Abuse Risk Screen Details Patient Name: Date of Service: [name] RD L. 07/02/2023 12:45 PM Medical Record Number: [id number] Patient Account Number: [id number] Date of Birth/Sex: Treating RN: 1956-03-17 (67 y.o. [name] Turon Kilmer: [name] Other Clinician: Referring Kiyoko Mcguirt: Treating Chalsea Darko/Extender: Joeseph Amor in Treatment: 0 Abuse Risk Screen Items Answer ABUSE RISK SCREEN: Has anyone close to you tried to hurt or harm you recentlyo No Do you feel uncomfortable with anyone in your familyo No Has anyone forced you do things that you didnt want to doo No Electronic Signature(s) Signed: 07/02/2023 3:33:31 PM By: Samuella Bruin Entered By: Samuella Bruin on 07/02/2023 09:48:12 -------------------------------------------------------------------------------- Activities of Daily Living Details Patient Name: Date of Service: [name] RD L. 07/02/2023 12:45 PM Medical Record Number: [id number] Patient Account Number: [id number] Date of Birth/Sex: Treating RN: 1956/06/07 (67 y.o. [name] Deysy Schabel: [name] Other Clinician: Referring Alyxander Kollmann: Treating Mia Milan/Extender: Joeseph Amor in Treatment: 0 Activities of Daily Living Items Answer Activities of Daily Living (Please select one for each item) Drive Automobile Not Able T Medications ake Completely Able Use T elephone Completely Able Care for Appearance Completely Able Use T oilet Completely Able Bath / Shower Completely Able Dress Self Need Assistance Feed Self Completely Able Walk Completely Able Get In / Out Bed Completely Able Housework Not Able Prepare Meals Need Assistance Handle Money Need Assistance Shop for Self Need Assistance Electronic Signature(s) Signed: 07/02/2023 3:33:31 PM By:  Samuella Bruin Entered By: Samuella Bruin on 07/02/2023 09:48:34 Demorian, [place] [name] ([id number]) [id number] Page 2 of 4 -------------------------------------------------------------------------------- Education Screening Details Patient Name: Date of Service: [name] RD L. 07/02/2023 12:45 PM Medical Record Number: [id number] Patient Account Number: [id number] Date of Birth/Sex: Treating RN: [birth year] (67 y.o. [name] Latrish Mogel: [name] Other Clinician: Referring Anaka Beazer: Treating Tyshana Nishida/Extender: Joeseph Amor in Treatment: 0 Primary Learner Assessed: Patient Learning Preferences/Education Level/Primary Language Learning Preference: Explanation, Demonstration, Video, Printed Material Highest Education Level: High School Preferred Language: English Cognitive Barrier Language Barrier: No Translator Needed: No Memory Deficit: No Emotional Barrier: No Cultural/Religious Beliefs Affecting Medical Care: No Physical Barrier Impaired Vision: Yes Glasses Impaired Hearing: No Decreased Hand dexterity: No Knowledge/Comprehension Knowledge Level: Medium Comprehension Level: Medium Ability to understand written instructions: Medium Ability to understand verbal instructions: Medium Motivation Anxiety Level: Calm Cooperation: Cooperative Education Importance: Acknowledges Need Interest in Health Problems: Asks Questions Perception: Coherent Willingness to Engage in Self-Management Medium Activities: Readiness to Engage in Self-Management Medium Activities: Electronic Signature(s) Signed: 07/02/2023 3:33:31 PM By: Samuella Bruin Entered By: Samuella Bruin on 07/02/2023 09:49:11 -------------------------------------------------------------------------------- Fall Risk Assessment Details Patient Name: Date of Service: [name] RD L. 07/02/2023 12:45 PM Medical Record Number:  [id number] Patient Account Number: [id number] Date of Birth/Sex: Treating RN: [birth year] (67 y.o. [name] Cherlyn Syring: [name] Other Clinician: Referring Othel Dicostanzo: Treating Shameek Nyquist/Extender: Joeseph Amor in Treatment: 0 Fall Risk Assessment Items Have you had 2 or more falls in the last [address] CLEMONS, STEPLER ([id number]) [phone] [id number] Page 3 of 4 Have you had any fall that resulted in injury in the last 12 monthso 0 Yes FALLS RISK SCREEN History of falling - immediate or within 3 months 25 Yes Secondary diagnosis (Do you have 2 or more medical diagnoseso) 15 Yes Ambulatory aid None/bed  rest/wheelchair/nurse 0 No Crutches/cane/walker 15 Yes Furniture 0 No Intravenous therapy Access/Saline/Heparin Lock 0 No Gait/Transferring Normal/ bed rest/ wheelchair 0 Yes Weak (short steps with or without shuffle, stooped but able to lift head while walking, may seek 10 Yes support from furniture) Impaired (short steps with shuffle, may have difficulty arising from chair, head down, impaired 0 No balance) Mental Status Oriented to own ability 0 Yes Electronic Signature(s) Signed: 07/02/2023 3:33:31 PM By: Samuella Bruin Entered By: Samuella Bruin on 07/02/2023 09:49:31 -------------------------------------------------------------------------------- Foot Assessment Details Patient Name: Date of Service: [name] RD L. 07/02/2023 12:45 PM Medical Record Number: [id number] Patient Account Number: [id number] Date of Birth/Sex: Treating RN: 01-05-1956 (67 y.o. [name] Susana Gripp: [name] Other Clinician: Referring Daisi Kentner: Treating Aracelly Tencza/Extender: Joeseph Amor in Treatment: 0 Foot Assessment Items Site Locations + = Sensation present, - = Sensation absent, C = Callus, U = Ulcer R = Redness, W = Warmth, M = Maceration, PU = Pre-ulcerative  lesion F = Fissure, S = Swelling, D = Dryness Assessment Right: Left: Other Deformity: No No Prior Foot Ulcer: No No Prior Amputation: No No Charcot Joint: No No Ambulatory Status: Ambulatory With Help Assistance Device: JOASH, FANK ([id number]) [phone] [id number] Page 4 of 4 Gait: Steady Electronic Signature(s) Signed: 07/02/2023 3:33:31 PM By: Samuella Bruin Entered By: Samuella Bruin on 07/02/2023 09:51:57 -------------------------------------------------------------------------------- Nutrition Risk Screening Details Patient Name: Date of Service: [name] RD L. 07/02/2023 12:45 PM Medical Record Number: [id number] Patient Account Number: [id number] Date of Birth/Sex: Treating RN: [birth year] (67 y.o. [name] Antoinett Dorman: [name] Other Clinician: Referring Kalayna Noy: Treating Haivyn Oravec/Extender: Joeseph Amor in Treatment: 0 Height (in): Weight (lbs): Body Mass Index (BMI): Nutrition Risk Screening Items Score Screening NUTRITION RISK SCREEN: I have an illness or condition that made me change the kind and/or amount of food I eat 0 No I eat fewer than two meals per day 0 No I eat few fruits and vegetables, or milk products 0 No I have three or more drinks of beer, liquor or wine almost every day 0 No I have tooth or mouth problems that make it hard for me to eat 0 No I don't always have enough money to buy the food I need 0 No I eat alone most of the time 0 No I take three or more different prescribed or over-the-counter drugs a day 1 Yes Without wanting to, I have lost or gained 10 pounds in the last six months 0 No I am not always physically able to shop, cook and/or feed myself 2 Yes Nutrition Protocols Good Risk Protocol Moderate Risk Protocol 0 Provide education on nutrition High Risk Proctocol Risk Level: Moderate Risk Score: 3 Electronic Signature(s) Signed: 07/02/2023  3:33:31 PM By: Samuella Bruin Entered By: Samuella Bruin on 07/02/2023 09:49:57

## 2023-07-02 NOTE — Progress Notes (Signed)
[name], [name] ([id number]) [id number] Page 1 of 11 Visit Report for 07/02/2023 Allergy List Details Patient Name: Date of Service: Loreen [name] RD L. 07/02/2023 12:45 PM Medical Record Number: [id number] Patient Account Number: [id number] Date of Birth/Sex: Treating RN: 05-23-1956 (67 y.o. [name] Primary Care Matt Delpizzo: [name] Other Clinician: Referring Andreka Stucki: Treating Charron Coultas/Extender: Joeseph Amor in Treatment: 0 Allergies Active Allergies adhesive tape amiodarone Statins-HMG-CoA Reductase Inhibitors Sulfa (Sulfonamide Antibiotics) Allergy Notes Electronic Signature(s) Signed: 07/02/2023 3:33:31 PM By: Samuella Bruin Entered By: Samuella Bruin on 07/01/2023 05:04:10 -------------------------------------------------------------------------------- Arrival Information Details Patient Name: Date of Service: Loreen [name] RD L. 07/02/2023 12:45 PM Medical Record Number: [id number] Patient Account Number: [id number] Date of Birth/Sex: Treating RN: 10/05/1956 (67 y.o. [name] Primary Care [name]: [name] Other Clinician: Referring Danicia Terhaar: Treating Jimmy Plessinger/Extender: Joeseph Amor in Treatment: 0 Visit Information Patient Arrived: [name] Time: 12:46 Accompanied By: self Transfer Assistance: None Patient Identification Verified: Yes Patient Has Alerts: Yes Patient Alerts: ABIs: both [place] 10/22/2022 TBIs: R: 0.92 L:1.16 1/24 History Since Last Visit Had a fall or experienced change in activities of daily living that may affect risk of falls: Yes Pain Present Now: No Electronic Signature(s) Signed: 07/02/2023 3:33:31 PM By: Samuella Bruin Entered By: Samuella Bruin on 07/02/2023 09:47:30 Kabat, [name] ([id number]) [id number] Page 2 of 11 -------------------------------------------------------------------------------- Clinic  Level of Care Assessment Details Patient Name: Date of Service: Loreen [name] RD L. 07/02/2023 12:45 PM Medical Record Number: [id number] Patient Account Number: [id number] Date of Birth/Sex: Treating RN: January 10, 1956 (67 y.o. [name] Primary Care [name]: [name] Other Clinician: Referring [name]: Treating [name]/Extender: Joeseph Amor in Treatment: 0 Clinic Level of Care Assessment Items TOOL 1 Quantity Score X- 1 0 Use when EandM and Procedure is performed on INITIAL visit ASSESSMENTS - Nursing Assessment / Reassessment X- 1 20 General Physical Exam (combine w/ comprehensive assessment (listed just below) when performed on new pt. evals) X- 1 25 Comprehensive Assessment (HX, ROS, Risk Assessments, Wounds Hx, etc.) ASSESSMENTS - Wound and Skin Assessment / Reassessment []  - 0 Dermatologic / Skin Assessment (not related to wound area) ASSESSMENTS - Ostomy and/or Continence Assessment and Care []  - 0 Incontinence Assessment and Management []  - 0 Ostomy Care Assessment and Management (repouching, etc.) PROCESS - Coordination of Care X - Simple Patient / Family Education for ongoing care 1 15 []  - 0 Complex (extensive) Patient / Family Education for ongoing care X- 1 10 Staff obtains Chiropractor, Records, T Results / Process Orders est []  - 0 Staff telephones HHA, Nursing Homes / Clarify orders / etc []  - 0 Routine Transfer to another Facility (non-emergent condition) []  - 0 Routine Hospital Admission (non-emergent condition) X- 1 15 New Admissions / Manufacturing engineer / Ordering NPWT Apligraf, etc. , []  - 0 Emergency Hospital Admission (emergent condition) PROCESS - Special Needs []  - 0 Pediatric / Minor Patient Management []  - 0 Isolation Patient Management []  - 0 Hearing / Language / Visual special needs []  - 0 Assessment of Community assistance (transportation, D/C planning, etc.) []  - 0 Additional assistance / Altered  mentation []  - 0 Support Surface(s) Assessment (bed, cushion, seat, etc.) INTERVENTIONS - Miscellaneous []  - 0 External ear exam []  - 0 Patient Transfer (multiple staff / Nurse, adult / Similar devices) []  - 0 Simple Staple / Suture removal (25 or less) []  - 0 Complex Staple / Suture removal (26 or more) []  - 0  not tissue or cotton balls. Peri-Wound Care ALMON, OGAZ ([id number]) [id number] Page 9 of 11 Topical Primary Dressing Hydrofera Blue Classic Foam, 2x2 in Discharge Instruction: Moisten with saline prior to applying to wound bed Secondary Dressing Bordered Gauze, 4x4 in Discharge Instruction: Apply over primary dressing as directed. Secured With Compression Wrap Compression Stockings Facilities manager) Signed: 07/02/2023 3:33:31 PM By: Samuella Bruin Entered By: Samuella Bruin on 07/02/2023  10:02:43 -------------------------------------------------------------------------------- Wound Assessment Details Patient Name: Date of Service: Loreen [name] RD L. 07/02/2023 12:45 PM Medical Record Number: [id number] Patient Account Number: [id number] Date of Birth/Sex: Treating RN: [birth year] (67 y.o. [name] Primary Care [name]: [name] Other Clinician: Referring [name]: Treating Stark Aguinaga/Extender: Joeseph Amor in Treatment: 0 Wound Status Wound Number: 5 Primary Venous Leg Ulcer Etiology: Wound Location: Left, Distal, Medial Lower Leg Wound Open Wounding Event: Trauma Status: Date Acquired: 05/14/2023 Comorbid Cataracts, Glaucoma, Anemia, Asthma, Chronic Obstructive Weeks Of Treatment: 0 History: Pulmonary Disease (COPD), Sleep Apnea, Arrhythmia, Congestive Clustered Wound: No Heart Failure, Deep Vein Thrombosis, Myocardial Infarction, Peripheral Venous Disease, Type II Diabetes, Osteoarthritis, Neuropathy Photos Wound Measurements Length: (cm) 5.5 Width: (cm) 2.6 Depth: (cm) 0.1 Area: (cm) 11.231 Volume: (cm) 1.123 % Reduction in Area: % Reduction in Volume: Epithelialization: Small (1-33%) Tunneling: No Undermining: No Wound Description Classification: Full Thickness Without Exposed Support Structures Wound Margin: Distinct, outline attached Exudate Amount: Medium Exudate Type: Serosanguineous Ysaguirre, Johsua L ([id number]) Exudate Color: red, brown Foul Odor After Cleansing: No Slough/Fibrino Yes [id number].pdf Page 10 of 11 Wound Bed Granulation Amount: Small (1-33%) Exposed Structure Granulation Quality: Red, Pink Fascia Exposed: No Necrotic Amount: Large (67-100%) Fat Layer (Subcutaneous Tissue) Exposed: Yes Tendon Exposed: No Muscle Exposed: No Joint Exposed: No Bone Exposed: No Periwound Skin Texture Texture Color No Abnormalities Noted: Yes No Abnormalities Noted: No Rubor:  Yes Moisture No Abnormalities Noted: Yes Temperature / Pain Temperature: No Abnormality Electronic Signature(s) Signed: 07/02/2023 3:33:31 PM By: Samuella Bruin Entered By: Samuella Bruin on 07/02/2023 10:03:04 -------------------------------------------------------------------------------- Wound Assessment Details Patient Name: Date of Service: Loreen [name] RD L. 07/02/2023 12:45 PM Medical Record Number: [id number] Patient Account Number: [id number] Date of Birth/Sex: Treating RN: 1956-03-18 (67 y.o. [name] Primary Care Dietrick Barris: [name] Other Clinician: Referring Celie Desrochers: Treating Chantille Navarrete/Extender: Joeseph Amor in Treatment: 0 Wound Status Wound Number: 6 Primary Venous Leg Ulcer Etiology: Wound Location: Left, Proximal, Medial Lower Leg Wound Open Wounding Event: Trauma Status: Date Acquired: 05/14/2023 Comorbid Cataracts, Glaucoma, Anemia, Asthma, Chronic Obstructive Weeks Of Treatment: 0 History: Pulmonary Disease (COPD), Sleep Apnea, Arrhythmia, Congestive Clustered Wound: No Heart Failure, Deep Vein Thrombosis, Myocardial Infarction, Peripheral Venous Disease, Type II Diabetes, Osteoarthritis, Neuropathy Photos Wound Measurements Length: (cm) 0.5 Width: (cm) 0.4 Depth: (cm) 0.1 Area: (cm) 0.157 Volume: (cm) 0.016 % Reduction in Area: % Reduction in Volume: Epithelialization: Medium (34-66%) Tunneling: No Undermining: No Wound Description Classification: Full Thickness Without Exposed Support Structures Wound Margin: Distinct, outline attached JEWELL, DESTA ([id number]) Exudate Amount: Medium Exudate Type: Serosanguineous Exudate Color: red, brown Foul Odor After Cleansing: No Slough/Fibrino Yes [id number].pdf Page 11 of 11 Wound Bed Granulation Amount: Medium (34-66%) Exposed Structure Granulation Quality: Red, Pink Fascia Exposed: No Necrotic Amount: Medium (34-66%) Fat Layer  (Subcutaneous Tissue) Exposed: Yes Necrotic Quality: Adherent Slough Tendon Exposed: No Muscle Exposed: No Joint Exposed: No Bone Exposed: No Periwound Skin Texture Texture Color No Abnormalities Noted: Yes No Abnormalities Noted: Yes Moisture Temperature / Pain No Abnormalities Noted: Yes Temperature:  not tissue or cotton balls. Peri-Wound Care ALMON, OGAZ ([id number]) [id number] Page 9 of 11 Topical Primary Dressing Hydrofera Blue Classic Foam, 2x2 in Discharge Instruction: Moisten with saline prior to applying to wound bed Secondary Dressing Bordered Gauze, 4x4 in Discharge Instruction: Apply over primary dressing as directed. Secured With Compression Wrap Compression Stockings Facilities manager) Signed: 07/02/2023 3:33:31 PM By: Samuella Bruin Entered By: Samuella Bruin on 07/02/2023  10:02:43 -------------------------------------------------------------------------------- Wound Assessment Details Patient Name: Date of Service: Loreen [name] RD L. 07/02/2023 12:45 PM Medical Record Number: [id number] Patient Account Number: [id number] Date of Birth/Sex: Treating RN: [birth year] (67 y.o. [name] Primary Care [name]: [name] Other Clinician: Referring [name]: Treating Stark Aguinaga/Extender: Joeseph Amor in Treatment: 0 Wound Status Wound Number: 5 Primary Venous Leg Ulcer Etiology: Wound Location: Left, Distal, Medial Lower Leg Wound Open Wounding Event: Trauma Status: Date Acquired: 05/14/2023 Comorbid Cataracts, Glaucoma, Anemia, Asthma, Chronic Obstructive Weeks Of Treatment: 0 History: Pulmonary Disease (COPD), Sleep Apnea, Arrhythmia, Congestive Clustered Wound: No Heart Failure, Deep Vein Thrombosis, Myocardial Infarction, Peripheral Venous Disease, Type II Diabetes, Osteoarthritis, Neuropathy Photos Wound Measurements Length: (cm) 5.5 Width: (cm) 2.6 Depth: (cm) 0.1 Area: (cm) 11.231 Volume: (cm) 1.123 % Reduction in Area: % Reduction in Volume: Epithelialization: Small (1-33%) Tunneling: No Undermining: No Wound Description Classification: Full Thickness Without Exposed Support Structures Wound Margin: Distinct, outline attached Exudate Amount: Medium Exudate Type: Serosanguineous Ysaguirre, Johsua L ([id number]) Exudate Color: red, brown Foul Odor After Cleansing: No Slough/Fibrino Yes [id number].pdf Page 10 of 11 Wound Bed Granulation Amount: Small (1-33%) Exposed Structure Granulation Quality: Red, Pink Fascia Exposed: No Necrotic Amount: Large (67-100%) Fat Layer (Subcutaneous Tissue) Exposed: Yes Tendon Exposed: No Muscle Exposed: No Joint Exposed: No Bone Exposed: No Periwound Skin Texture Texture Color No Abnormalities Noted: Yes No Abnormalities Noted: No Rubor:  Yes Moisture No Abnormalities Noted: Yes Temperature / Pain Temperature: No Abnormality Electronic Signature(s) Signed: 07/02/2023 3:33:31 PM By: Samuella Bruin Entered By: Samuella Bruin on 07/02/2023 10:03:04 -------------------------------------------------------------------------------- Wound Assessment Details Patient Name: Date of Service: Loreen [name] RD L. 07/02/2023 12:45 PM Medical Record Number: [id number] Patient Account Number: [id number] Date of Birth/Sex: Treating RN: 1956-03-18 (67 y.o. [name] Primary Care Dietrick Barris: [name] Other Clinician: Referring Celie Desrochers: Treating Chantille Navarrete/Extender: Joeseph Amor in Treatment: 0 Wound Status Wound Number: 6 Primary Venous Leg Ulcer Etiology: Wound Location: Left, Proximal, Medial Lower Leg Wound Open Wounding Event: Trauma Status: Date Acquired: 05/14/2023 Comorbid Cataracts, Glaucoma, Anemia, Asthma, Chronic Obstructive Weeks Of Treatment: 0 History: Pulmonary Disease (COPD), Sleep Apnea, Arrhythmia, Congestive Clustered Wound: No Heart Failure, Deep Vein Thrombosis, Myocardial Infarction, Peripheral Venous Disease, Type II Diabetes, Osteoarthritis, Neuropathy Photos Wound Measurements Length: (cm) 0.5 Width: (cm) 0.4 Depth: (cm) 0.1 Area: (cm) 0.157 Volume: (cm) 0.016 % Reduction in Area: % Reduction in Volume: Epithelialization: Medium (34-66%) Tunneling: No Undermining: No Wound Description Classification: Full Thickness Without Exposed Support Structures Wound Margin: Distinct, outline attached JEWELL, DESTA ([id number]) Exudate Amount: Medium Exudate Type: Serosanguineous Exudate Color: red, brown Foul Odor After Cleansing: No Slough/Fibrino Yes [id number].pdf Page 11 of 11 Wound Bed Granulation Amount: Medium (34-66%) Exposed Structure Granulation Quality: Red, Pink Fascia Exposed: No Necrotic Amount: Medium (34-66%) Fat Layer  (Subcutaneous Tissue) Exposed: Yes Necrotic Quality: Adherent Slough Tendon Exposed: No Muscle Exposed: No Joint Exposed: No Bone Exposed: No Periwound Skin Texture Texture Color No Abnormalities Noted: Yes No Abnormalities Noted: Yes Moisture Temperature / Pain No Abnormalities Noted: Yes Temperature:  not tissue or cotton balls. Peri-Wound Care ALMON, OGAZ ([id number]) [id number] Page 9 of 11 Topical Primary Dressing Hydrofera Blue Classic Foam, 2x2 in Discharge Instruction: Moisten with saline prior to applying to wound bed Secondary Dressing Bordered Gauze, 4x4 in Discharge Instruction: Apply over primary dressing as directed. Secured With Compression Wrap Compression Stockings Facilities manager) Signed: 07/02/2023 3:33:31 PM By: Samuella Bruin Entered By: Samuella Bruin on 07/02/2023  10:02:43 -------------------------------------------------------------------------------- Wound Assessment Details Patient Name: Date of Service: Loreen [name] RD L. 07/02/2023 12:45 PM Medical Record Number: [id number] Patient Account Number: [id number] Date of Birth/Sex: Treating RN: [birth year] (67 y.o. [name] Primary Care [name]: [name] Other Clinician: Referring [name]: Treating Stark Aguinaga/Extender: Joeseph Amor in Treatment: 0 Wound Status Wound Number: 5 Primary Venous Leg Ulcer Etiology: Wound Location: Left, Distal, Medial Lower Leg Wound Open Wounding Event: Trauma Status: Date Acquired: 05/14/2023 Comorbid Cataracts, Glaucoma, Anemia, Asthma, Chronic Obstructive Weeks Of Treatment: 0 History: Pulmonary Disease (COPD), Sleep Apnea, Arrhythmia, Congestive Clustered Wound: No Heart Failure, Deep Vein Thrombosis, Myocardial Infarction, Peripheral Venous Disease, Type II Diabetes, Osteoarthritis, Neuropathy Photos Wound Measurements Length: (cm) 5.5 Width: (cm) 2.6 Depth: (cm) 0.1 Area: (cm) 11.231 Volume: (cm) 1.123 % Reduction in Area: % Reduction in Volume: Epithelialization: Small (1-33%) Tunneling: No Undermining: No Wound Description Classification: Full Thickness Without Exposed Support Structures Wound Margin: Distinct, outline attached Exudate Amount: Medium Exudate Type: Serosanguineous Ysaguirre, Johsua L ([id number]) Exudate Color: red, brown Foul Odor After Cleansing: No Slough/Fibrino Yes [id number].pdf Page 10 of 11 Wound Bed Granulation Amount: Small (1-33%) Exposed Structure Granulation Quality: Red, Pink Fascia Exposed: No Necrotic Amount: Large (67-100%) Fat Layer (Subcutaneous Tissue) Exposed: Yes Tendon Exposed: No Muscle Exposed: No Joint Exposed: No Bone Exposed: No Periwound Skin Texture Texture Color No Abnormalities Noted: Yes No Abnormalities Noted: No Rubor:  Yes Moisture No Abnormalities Noted: Yes Temperature / Pain Temperature: No Abnormality Electronic Signature(s) Signed: 07/02/2023 3:33:31 PM By: Samuella Bruin Entered By: Samuella Bruin on 07/02/2023 10:03:04 -------------------------------------------------------------------------------- Wound Assessment Details Patient Name: Date of Service: Loreen [name] RD L. 07/02/2023 12:45 PM Medical Record Number: [id number] Patient Account Number: [id number] Date of Birth/Sex: Treating RN: 1956-03-18 (67 y.o. [name] Primary Care Dietrick Barris: [name] Other Clinician: Referring Celie Desrochers: Treating Chantille Navarrete/Extender: Joeseph Amor in Treatment: 0 Wound Status Wound Number: 6 Primary Venous Leg Ulcer Etiology: Wound Location: Left, Proximal, Medial Lower Leg Wound Open Wounding Event: Trauma Status: Date Acquired: 05/14/2023 Comorbid Cataracts, Glaucoma, Anemia, Asthma, Chronic Obstructive Weeks Of Treatment: 0 History: Pulmonary Disease (COPD), Sleep Apnea, Arrhythmia, Congestive Clustered Wound: No Heart Failure, Deep Vein Thrombosis, Myocardial Infarction, Peripheral Venous Disease, Type II Diabetes, Osteoarthritis, Neuropathy Photos Wound Measurements Length: (cm) 0.5 Width: (cm) 0.4 Depth: (cm) 0.1 Area: (cm) 0.157 Volume: (cm) 0.016 % Reduction in Area: % Reduction in Volume: Epithelialization: Medium (34-66%) Tunneling: No Undermining: No Wound Description Classification: Full Thickness Without Exposed Support Structures Wound Margin: Distinct, outline attached JEWELL, DESTA ([id number]) Exudate Amount: Medium Exudate Type: Serosanguineous Exudate Color: red, brown Foul Odor After Cleansing: No Slough/Fibrino Yes [id number].pdf Page 11 of 11 Wound Bed Granulation Amount: Medium (34-66%) Exposed Structure Granulation Quality: Red, Pink Fascia Exposed: No Necrotic Amount: Medium (34-66%) Fat Layer  (Subcutaneous Tissue) Exposed: Yes Necrotic Quality: Adherent Slough Tendon Exposed: No Muscle Exposed: No Joint Exposed: No Bone Exposed: No Periwound Skin Texture Texture Color No Abnormalities Noted: Yes No Abnormalities Noted: Yes Moisture Temperature / Pain No Abnormalities Noted: Yes Temperature:  not tissue or cotton balls. Peri-Wound Care ALMON, OGAZ ([id number]) [id number] Page 9 of 11 Topical Primary Dressing Hydrofera Blue Classic Foam, 2x2 in Discharge Instruction: Moisten with saline prior to applying to wound bed Secondary Dressing Bordered Gauze, 4x4 in Discharge Instruction: Apply over primary dressing as directed. Secured With Compression Wrap Compression Stockings Facilities manager) Signed: 07/02/2023 3:33:31 PM By: Samuella Bruin Entered By: Samuella Bruin on 07/02/2023  10:02:43 -------------------------------------------------------------------------------- Wound Assessment Details Patient Name: Date of Service: Loreen [name] RD L. 07/02/2023 12:45 PM Medical Record Number: [id number] Patient Account Number: [id number] Date of Birth/Sex: Treating RN: [birth year] (67 y.o. [name] Primary Care [name]: [name] Other Clinician: Referring [name]: Treating Stark Aguinaga/Extender: Joeseph Amor in Treatment: 0 Wound Status Wound Number: 5 Primary Venous Leg Ulcer Etiology: Wound Location: Left, Distal, Medial Lower Leg Wound Open Wounding Event: Trauma Status: Date Acquired: 05/14/2023 Comorbid Cataracts, Glaucoma, Anemia, Asthma, Chronic Obstructive Weeks Of Treatment: 0 History: Pulmonary Disease (COPD), Sleep Apnea, Arrhythmia, Congestive Clustered Wound: No Heart Failure, Deep Vein Thrombosis, Myocardial Infarction, Peripheral Venous Disease, Type II Diabetes, Osteoarthritis, Neuropathy Photos Wound Measurements Length: (cm) 5.5 Width: (cm) 2.6 Depth: (cm) 0.1 Area: (cm) 11.231 Volume: (cm) 1.123 % Reduction in Area: % Reduction in Volume: Epithelialization: Small (1-33%) Tunneling: No Undermining: No Wound Description Classification: Full Thickness Without Exposed Support Structures Wound Margin: Distinct, outline attached Exudate Amount: Medium Exudate Type: Serosanguineous Ysaguirre, Johsua L ([id number]) Exudate Color: red, brown Foul Odor After Cleansing: No Slough/Fibrino Yes [id number].pdf Page 10 of 11 Wound Bed Granulation Amount: Small (1-33%) Exposed Structure Granulation Quality: Red, Pink Fascia Exposed: No Necrotic Amount: Large (67-100%) Fat Layer (Subcutaneous Tissue) Exposed: Yes Tendon Exposed: No Muscle Exposed: No Joint Exposed: No Bone Exposed: No Periwound Skin Texture Texture Color No Abnormalities Noted: Yes No Abnormalities Noted: No Rubor:  Yes Moisture No Abnormalities Noted: Yes Temperature / Pain Temperature: No Abnormality Electronic Signature(s) Signed: 07/02/2023 3:33:31 PM By: Samuella Bruin Entered By: Samuella Bruin on 07/02/2023 10:03:04 -------------------------------------------------------------------------------- Wound Assessment Details Patient Name: Date of Service: Loreen [name] RD L. 07/02/2023 12:45 PM Medical Record Number: [id number] Patient Account Number: [id number] Date of Birth/Sex: Treating RN: 1956-03-18 (67 y.o. [name] Primary Care Dietrick Barris: [name] Other Clinician: Referring Celie Desrochers: Treating Chantille Navarrete/Extender: Joeseph Amor in Treatment: 0 Wound Status Wound Number: 6 Primary Venous Leg Ulcer Etiology: Wound Location: Left, Proximal, Medial Lower Leg Wound Open Wounding Event: Trauma Status: Date Acquired: 05/14/2023 Comorbid Cataracts, Glaucoma, Anemia, Asthma, Chronic Obstructive Weeks Of Treatment: 0 History: Pulmonary Disease (COPD), Sleep Apnea, Arrhythmia, Congestive Clustered Wound: No Heart Failure, Deep Vein Thrombosis, Myocardial Infarction, Peripheral Venous Disease, Type II Diabetes, Osteoarthritis, Neuropathy Photos Wound Measurements Length: (cm) 0.5 Width: (cm) 0.4 Depth: (cm) 0.1 Area: (cm) 0.157 Volume: (cm) 0.016 % Reduction in Area: % Reduction in Volume: Epithelialization: Medium (34-66%) Tunneling: No Undermining: No Wound Description Classification: Full Thickness Without Exposed Support Structures Wound Margin: Distinct, outline attached JEWELL, DESTA ([id number]) Exudate Amount: Medium Exudate Type: Serosanguineous Exudate Color: red, brown Foul Odor After Cleansing: No Slough/Fibrino Yes [id number].pdf Page 11 of 11 Wound Bed Granulation Amount: Medium (34-66%) Exposed Structure Granulation Quality: Red, Pink Fascia Exposed: No Necrotic Amount: Medium (34-66%) Fat Layer  (Subcutaneous Tissue) Exposed: Yes Necrotic Quality: Adherent Slough Tendon Exposed: No Muscle Exposed: No Joint Exposed: No Bone Exposed: No Periwound Skin Texture Texture Color No Abnormalities Noted: Yes No Abnormalities Noted: Yes Moisture Temperature / Pain No Abnormalities Noted: Yes Temperature:  [name], [name] ([id number]) [id number].pdf Page 1 of 11 Visit Report for 07/02/2023 Allergy List Details Patient Name: Date of Service: Loreen [name] RD L. 07/02/2023 12:45 PM Medical Record Number: [id number] Patient Account Number: [id number] Date of Birth/Sex: Treating RN: 05-23-1956 (67 y.o. [name] Primary Care Matt Delpizzo: [name] Other Clinician: Referring Andreka Stucki: Treating Charron Coultas/Extender: Joeseph Amor in Treatment: 0 Allergies Active Allergies adhesive tape amiodarone Statins-HMG-CoA Reductase Inhibitors Sulfa (Sulfonamide Antibiotics) Allergy Notes Electronic Signature(s) Signed: 07/02/2023 3:33:31 PM By: Samuella Bruin Entered By: Samuella Bruin on 07/01/2023 05:04:10 -------------------------------------------------------------------------------- Arrival Information Details Patient Name: Date of Service: Loreen [name] RD L. 07/02/2023 12:45 PM Medical Record Number: [id number] Patient Account Number: [id number] Date of Birth/Sex: Treating RN: 10/05/1956 (67 y.o. [name] Primary Care [name]: [name] Other Clinician: Referring Danicia Terhaar: Treating Jimmy Plessinger/Extender: Joeseph Amor in Treatment: 0 Visit Information Patient Arrived: [name] Time: 12:46 Accompanied By: self Transfer Assistance: None Patient Identification Verified: Yes Patient Has Alerts: Yes Patient Alerts: ABIs: both [place] 10/22/2022 TBIs: R: 0.92 L:1.16 1/24 History Since Last Visit Had a fall or experienced change in activities of daily living that may affect risk of falls: Yes Pain Present Now: No Electronic Signature(s) Signed: 07/02/2023 3:33:31 PM By: Samuella Bruin Entered By: Samuella Bruin on 07/02/2023 09:47:30 Kabat, [name] ([id number]) [id number] Page 2 of 11 -------------------------------------------------------------------------------- Clinic  Level of Care Assessment Details Patient Name: Date of Service: Loreen [name] RD L. 07/02/2023 12:45 PM Medical Record Number: [id number] Patient Account Number: [id number] Date of Birth/Sex: Treating RN: January 10, 1956 (67 y.o. [name] Primary Care [name]: [name] Other Clinician: Referring [name]: Treating [name]/Extender: Joeseph Amor in Treatment: 0 Clinic Level of Care Assessment Items TOOL 1 Quantity Score X- 1 0 Use when EandM and Procedure is performed on INITIAL visit ASSESSMENTS - Nursing Assessment / Reassessment X- 1 20 General Physical Exam (combine w/ comprehensive assessment (listed just below) when performed on new pt. evals) X- 1 25 Comprehensive Assessment (HX, ROS, Risk Assessments, Wounds Hx, etc.) ASSESSMENTS - Wound and Skin Assessment / Reassessment []  - 0 Dermatologic / Skin Assessment (not related to wound area) ASSESSMENTS - Ostomy and/or Continence Assessment and Care []  - 0 Incontinence Assessment and Management []  - 0 Ostomy Care Assessment and Management (repouching, etc.) PROCESS - Coordination of Care X - Simple Patient / Family Education for ongoing care 1 15 []  - 0 Complex (extensive) Patient / Family Education for ongoing care X- 1 10 Staff obtains Chiropractor, Records, T Results / Process Orders est []  - 0 Staff telephones HHA, Nursing Homes / Clarify orders / etc []  - 0 Routine Transfer to another Facility (non-emergent condition) []  - 0 Routine Hospital Admission (non-emergent condition) X- 1 15 New Admissions / Manufacturing engineer / Ordering NPWT Apligraf, etc. , []  - 0 Emergency Hospital Admission (emergent condition) PROCESS - Special Needs []  - 0 Pediatric / Minor Patient Management []  - 0 Isolation Patient Management []  - 0 Hearing / Language / Visual special needs []  - 0 Assessment of Community assistance (transportation, D/C planning, etc.) []  - 0 Additional assistance / Altered  mentation []  - 0 Support Surface(s) Assessment (bed, cushion, seat, etc.) INTERVENTIONS - Miscellaneous []  - 0 External ear exam []  - 0 Patient Transfer (multiple staff / Nurse, adult / Similar devices) []  - 0 Simple Staple / Suture removal (25 or less) []  - 0 Complex Staple / Suture removal (26 or more) []  - 0

## 2023-07-02 NOTE — Progress Notes (Signed)
Discharge Instructions: Apply Urgo K2 Lite as directed (alternative to 3 layer compression). Wound #6 - Lower Leg Wound Laterality: Left, Medial, Proximal Cleanser: Soap and Water 1 x Per Day/30 Days Discharge Instructions: May shower and wash wound with dial antibacterial soap and water prior to dressing change. Cleanser: Vashe 5.8 (oz) 1 x Per Day/30 Days Discharge Instructions: Cleanse the wound with Vashe prior to applying a clean dressing using gauze sponges,  not tissue or cotton balls. Peri-Wound Care: Sween Lotion (Moisturizing lotion) 1 x Per Day/30 Days Discharge Instructions: Apply moisturizing lotion as directed Prim Dressing: Hydrofera Blue Classic Foam, 4x4 in 1 x Per Day/30 Days ary Discharge Instructions: Moisten with saline prior to applying to wound bed Secondary Dressing: ABD Pad, 5x9 1 x Per Day/30 Days Discharge Instructions: Apply over primary dressing as directed. Secondary Dressing: Woven Gauze Sponge, Non-Sterile 4x4 in 1 x Per Day/30 Days Discharge Instructions: Apply over primary dressing as directed. Compression Wrap: Urgo K2 Lite, (equivalent to a 3 layer) two layer compression system, regular 1 x Per Day/30 Days Discharge Instructions: Apply Urgo K2 Lite as directed (alternative to 3 layer compression). Patient Medications llergies: adhesive tape, amiodarone, Statins-HMG-CoA Reductase Inhibitors, Sulfa (Sulfonamide Antibiotics) A Notifications Medication Indication Start End 07/02/2023 lidocaine DOSE topical 4 % cream - cream topical Electronic Signature(s) Signed: 07/02/2023 2:51:21 PM By: Duanne Guess MD FACS Signed: 07/02/2023 3:33:31 PM By: Samuella Bruin Previous Signature: 07/02/2023 1:30:34 PM Version By: Duanne Guess MD FACS Entered By: Samuella Bruin on 07/02/2023 10:35:24 -------------------------------------------------------------------------------- Problem List Details Patient Name: Date of Service: [name] RD L. 07/02/2023 12:45 PM Medical Record Number: [id number] Patient Account Number: [id number] Date of Birth/Sex: Treating RN: [birth year] (68 y.o. M) Primary Care Provider: [name] Other Clinician: Referring Provider: Treating Provider/Extender: Joeseph Amor in Treatment: 0 [place], [place] ([id number]) [id number] Page 7 of 14 Active Problems ICD-10 Encounter Code Description Active Date MDM Diagnosis L97.822 Non-pressure  chronic ulcer of other part of left lower leg with fat layer exposed9/17/2024 No Yes L97.812 Non-pressure chronic ulcer of other part of right lower leg with fat layer 07/02/2023 No Yes exposed E11.622 Type 2 diabetes mellitus with other skin ulcer 07/02/2023 No Yes I50.20 Unspecified systolic (congestive) heart failure 07/02/2023 No Yes Inactive Problems Resolved Problems Electronic Signature(s) Signed: 07/02/2023 1:01:28 PM By: Duanne Guess MD FACS Entered By: Duanne Guess on 07/02/2023 10:01:28 -------------------------------------------------------------------------------- Progress Note Details Patient Name: Date of Service: [name] RD L. 07/02/2023 12:45 PM Medical Record Number: [id number] Patient Account Number: [id number] Date of Birth/Sex: Treating RN: 09/10/1956 (67 y.o. M) Primary Care Provider: [name] Other Clinician: Referring Provider: Treating Provider/Extender: Joeseph Amor in Treatment: 0 Subjective Chief Complaint Information obtained from Patient 05/24/2022; right lower extremity wound 07/02/2023: returns with BLE wounds History of Present Illness (HPI) Admission 5/2 [name] is a 67 year old male with a past medical history of systolic congestive heart failure, COPD, nonischemic cardiomyopathy, type 2 diabetes, chronic venous insufficiency that presents to the clinic for a 28-month history of nonhealing wound to his left lower extremity. He states that in November 2021 he fell off his porch and Hit his leg against a brick. The wound has healed mostly except for 1 area that is still open. He reports moderate Serosanguineous drainage daily. He has been keeping it covered with Band-Aids. He reports 2 rounds of antibiotics for this issue. He denies pain. He denies current acute signs of infection. 5/9; patient presents for 1 week follow-up.  [name], [name] ([id number]) [id number] Page 1 of 14 Visit Report for 07/02/2023 Chief Complaint Document Details Patient Name: Date of Service: [name] RD L. 07/02/2023 12:45 PM Medical Record Number: [id number] Patient Account Number: [id number] Date of Birth/Sex: Treating RN: May 13, 1956 (67 y.o. M) Primary Care Provider: [name] Other Clinician: Referring Provider: Treating Provider/Extender: Joeseph Amor in Treatment: 0 Information Obtained from: Patient Chief Complaint 05/24/2022; right lower extremity wound 07/02/2023: returns with BLE wounds Electronic Signature(s) Signed: 07/02/2023 1:02:04 PM By: Duanne Guess MD FACS Entered By: Duanne Guess on 07/02/2023 10:02:04 -------------------------------------------------------------------------------- Debridement Details Patient Name: Date of Service: [name] RD L. 07/02/2023 12:45 PM Medical Record Number: [id number] Patient Account Number: [id number] Date of Birth/Sex: Treating RN: 1956/10/08 (67 y.o. [name] Primary Care Provider: [name] Other Clinician: Referring Provider: Treating Provider/Extender: Joeseph Amor in Treatment: 0 Debridement Performed for Assessment: Wound #5 Left,Distal,Medial Lower Leg Performed By: Physician Duanne Guess, MD The following information was scribed by: Samuella Bruin The information was scribed for: Duanne Guess Debridement Type: Debridement Severity of Tissue Pre Debridement: Fat layer exposed Level of Consciousness (Pre-procedure): Awake and Alert Pre-procedure Verification/Time Out Yes - 13:10 Taken: Start Time: 13:10 Pain Control: Lidocaine 4% Topical Solution Percent of Wound Bed Debrided: 100% T Area Debrided (cm): otal 11.23 Tissue and other material debrided: Non-Viable, Eschar, Slough, Subcutaneous, Slough Level: Skin/Subcutaneous Tissue Debridement Description:  Excisional Instrument: Curette Bleeding: Minimum Hemostasis Achieved: Pressure Response to Treatment: Procedure was tolerated well Level of Consciousness (Post- Awake and Alert procedure): Post Debridement Measurements of Total Wound Length: (cm) 5.5 Width: (cm) 2.6 Depth: (cm) 0.1 Volume: (cm) 1.123 Character of Wound/Ulcer Post Debridement: Improved [name], [name] ([id number]) [id number] Page 2 of 14 Severity of Tissue Post Debridement: Fat layer exposed Post Procedure Diagnosis Same as Pre-procedure Electronic Signature(s) Signed: 07/02/2023 1:30:34 PM By: Duanne Guess MD FACS Signed: 07/02/2023 3:33:31 PM By: Samuella Bruin Entered By: Samuella Bruin on 07/02/2023 10:11:57 -------------------------------------------------------------------------------- Debridement Details Patient Name: Date of Service: [name] RD L. 07/02/2023 12:45 PM Medical Record Number: [id number] Patient Account Number: [id number] Date of Birth/Sex: Treating RN: 12-Nov-1955 (67 y.o. [name] Primary Care Provider: [name] Other Clinician: Referring Provider: Treating Provider/Extender: Joeseph Amor in Treatment: 0 Debridement Performed for Assessment: Wound #6 Left,Proximal,Medial Lower Leg Performed By: Physician Duanne Guess, MD The following information was scribed by: Samuella Bruin The information was scribed for: Duanne Guess Debridement Type: Debridement Severity of Tissue Pre Debridement: Fat layer exposed Level of Consciousness (Pre-procedure): Awake and Alert Pre-procedure Verification/Time Out Yes - 13:10 Taken: Start Time: 13:10 Pain Control: Lidocaine 4% Topical Solution Percent of Wound Bed Debrided: 100% T Area Debrided (cm): otal 0.16 Tissue and other material debrided: Non-Viable, Eschar, Slough, Slough Level: Non-Viable Tissue Debridement Description: Selective/Open Wound Instrument:  Curette Bleeding: Minimum Hemostasis Achieved: Pressure Response to Treatment: Procedure was tolerated well Level of Consciousness (Post- Awake and Alert procedure): Post Debridement Measurements of Total Wound Length: (cm) 0.5 Width: (cm) 0.4 Depth: (cm) 0.1 Volume: (cm) 0.016 Character of Wound/Ulcer Post Debridement: Improved Severity of Tissue Post Debridement: Fat layer exposed Post Procedure Diagnosis Same as Pre-procedure Electronic Signature(s) Signed: 07/02/2023 1:30:34 PM By: Duanne Guess MD FACS Signed: 07/02/2023 3:33:31 PM By: Samuella Bruin Entered By: Samuella Bruin on 07/02/2023 10:13:45 [name], [name] ([id number]) [id number] Page 3 of 14 -------------------------------------------------------------------------------- Debridement Details Patient Name: Date of Service: [name] RD L. 07/02/2023 12:45 PM Medical Record  1 x Per Day/30 Days Discharge Instructions: Apply over primary dressing as directed. Com pression Wrap: Urgo K2 Lite, (equivalent to a 3 layer) two layer compression system, regular 1 x Per Day/30 Days Discharge Instructions: Apply Urgo K2 Lite as directed (alternative to 3 layer compression). WOUND #6: - Lower Leg Wound Laterality: Left, Medial, Proximal Cleanser: Soap and Water 1 x Per Day/30 Days Discharge Instructions: May shower and wash wound with dial antibacterial soap and water prior to dressing change. Cleanser: Vashe 5.8 (oz) 1 x Per Day/30 Days Discharge Instructions: Cleanse the wound with Vashe prior to applying a clean dressing using gauze sponges, not tissue or cotton balls. Peri-Wound Care: Sween Lotion (Moisturizing lotion) 1 x Per Day/30 Days Discharge Instructions: Apply moisturizing lotion as directed [name], [name] ([id number]) [id number] Page 11 of 14 Prim Dressing: Hydrofera Blue Classic Foam, 4x4 in 1 x Per Day/30 Days ary Discharge Instructions: Moisten with saline prior to applying to wound bed Secondary Dressing: ABD Pad, 5x9 1 x Per Day/30 Days Discharge Instructions: Apply over primary dressing as directed. Secondary Dressing: Woven Gauze Sponge, Non-Sterile 4x4 in 1 x Per Day/30 Days Discharge Instructions: Apply over primary dressing as directed. Com pression Wrap: Urgo K2 Lite, (equivalent to a 3 layer) two layer compression system, regular 1 x Per Day/30 Days Discharge Instructions: Apply Urgo K2 Lite as directed (alternative to 3 layer  compression). 07/02/2023: The right knee has an open area in the center with hypertrophic granulation tissue and slough. The left anterior tibial laceration is covered with a thick layer of rubbery slough. There is also a smaller proximal wound that has some slough and eschar present. Edema control is poor. I used a curette to debride slough and subcutaneous tissue from the right knee and the larger of the left anterior tibial wounds. I debrided slough and eschar from the small proximal wound. I going to use Hydrofera Blue classic to all of the sites. We will apply an Urgo lite wrap on the left leg. He was instructed to wear his compression stocking on the right leg. He will change the wound dressing on his right knee daily. Follow-up in 1 week. Electronic Signature(s) Signed: 07/02/2023 2:51:21 PM By: Duanne Guess MD FACS Signed: 07/02/2023 3:33:31 PM By: Samuella Bruin Previous Signature: 07/02/2023 1:24:52 PM Version By: Duanne Guess MD FACS Entered By: Samuella Bruin on 07/02/2023 10:37:38 -------------------------------------------------------------------------------- HxROS Details Patient Name: Date of Service: [name] RD L. 07/02/2023 12:45 PM Medical Record Number: [id number] Patient Account Number: [id number] Date of Birth/Sex: Treating RN: 1956-01-26 (67 y.o. [name] Primary Care Provider: [name] Other Clinician: Referring Provider: Treating Provider/Extender: Joeseph Amor in Treatment: 0 Information Obtained From Patient Chart Constitutional Symptoms (General Health) Complaints and Symptoms: Negative for: Fatigue; Fever; Chills; Marked Weight Change Medical History: Past Medical History Notes: obesity Ear/Nose/Mouth/Throat Complaints and Symptoms: Negative for: Chronic sinus problems or rhinitis Integumentary (Skin) Complaints and Symptoms: Positive for: Wounds Medical History: Negative for: History of  Burn Eyes Medical History: Positive for: Cataracts; Glaucoma Negative for: Optic Neuritis Hematologic/Lymphatic Medical History: Positive for: Anemia [name], [name] ([id number]) [id number] Page 12 of 14 Respiratory Medical History: Positive for: Asthma; Chronic Obstructive Pulmonary Disease (COPD); Sleep Apnea - sleeps in recliner Negative for: Pneumothorax Past Medical History Notes: O2 dependent Cardiovascular Medical History: Positive for: Arrhythmia - afib; Congestive Heart Failure; Deep Vein Thrombosis; Myocardial Infarction; Peripheral Venous Disease Past Medical History Notes: cardiomyopathy, pacemaker/defibulator Gastrointestinal Medical History: Past Medical History Notes: GERD  Discharge Instructions: Apply Urgo K2 Lite as directed (alternative to 3 layer compression). Wound #6 - Lower Leg Wound Laterality: Left, Medial, Proximal Cleanser: Soap and Water 1 x Per Day/30 Days Discharge Instructions: May shower and wash wound with dial antibacterial soap and water prior to dressing change. Cleanser: Vashe 5.8 (oz) 1 x Per Day/30 Days Discharge Instructions: Cleanse the wound with Vashe prior to applying a clean dressing using gauze sponges,  not tissue or cotton balls. Peri-Wound Care: Sween Lotion (Moisturizing lotion) 1 x Per Day/30 Days Discharge Instructions: Apply moisturizing lotion as directed Prim Dressing: Hydrofera Blue Classic Foam, 4x4 in 1 x Per Day/30 Days ary Discharge Instructions: Moisten with saline prior to applying to wound bed Secondary Dressing: ABD Pad, 5x9 1 x Per Day/30 Days Discharge Instructions: Apply over primary dressing as directed. Secondary Dressing: Woven Gauze Sponge, Non-Sterile 4x4 in 1 x Per Day/30 Days Discharge Instructions: Apply over primary dressing as directed. Compression Wrap: Urgo K2 Lite, (equivalent to a 3 layer) two layer compression system, regular 1 x Per Day/30 Days Discharge Instructions: Apply Urgo K2 Lite as directed (alternative to 3 layer compression). Patient Medications llergies: adhesive tape, amiodarone, Statins-HMG-CoA Reductase Inhibitors, Sulfa (Sulfonamide Antibiotics) A Notifications Medication Indication Start End 07/02/2023 lidocaine DOSE topical 4 % cream - cream topical Electronic Signature(s) Signed: 07/02/2023 2:51:21 PM By: Duanne Guess MD FACS Signed: 07/02/2023 3:33:31 PM By: Samuella Bruin Previous Signature: 07/02/2023 1:30:34 PM Version By: Duanne Guess MD FACS Entered By: Samuella Bruin on 07/02/2023 10:35:24 -------------------------------------------------------------------------------- Problem List Details Patient Name: Date of Service: [name] RD L. 07/02/2023 12:45 PM Medical Record Number: [id number] Patient Account Number: [id number] Date of Birth/Sex: Treating RN: [birth year] (68 y.o. M) Primary Care Provider: [name] Other Clinician: Referring Provider: Treating Provider/Extender: Joeseph Amor in Treatment: 0 [place], [place] ([id number]) [id number] Page 7 of 14 Active Problems ICD-10 Encounter Code Description Active Date MDM Diagnosis L97.822 Non-pressure  chronic ulcer of other part of left lower leg with fat layer exposed9/17/2024 No Yes L97.812 Non-pressure chronic ulcer of other part of right lower leg with fat layer 07/02/2023 No Yes exposed E11.622 Type 2 diabetes mellitus with other skin ulcer 07/02/2023 No Yes I50.20 Unspecified systolic (congestive) heart failure 07/02/2023 No Yes Inactive Problems Resolved Problems Electronic Signature(s) Signed: 07/02/2023 1:01:28 PM By: Duanne Guess MD FACS Entered By: Duanne Guess on 07/02/2023 10:01:28 -------------------------------------------------------------------------------- Progress Note Details Patient Name: Date of Service: [name] RD L. 07/02/2023 12:45 PM Medical Record Number: [id number] Patient Account Number: [id number] Date of Birth/Sex: Treating RN: 09/10/1956 (67 y.o. M) Primary Care Provider: [name] Other Clinician: Referring Provider: Treating Provider/Extender: Joeseph Amor in Treatment: 0 Subjective Chief Complaint Information obtained from Patient 05/24/2022; right lower extremity wound 07/02/2023: returns with BLE wounds History of Present Illness (HPI) Admission 5/2 [name] is a 67 year old male with a past medical history of systolic congestive heart failure, COPD, nonischemic cardiomyopathy, type 2 diabetes, chronic venous insufficiency that presents to the clinic for a 28-month history of nonhealing wound to his left lower extremity. He states that in November 2021 he fell off his porch and Hit his leg against a brick. The wound has healed mostly except for 1 area that is still open. He reports moderate Serosanguineous drainage daily. He has been keeping it covered with Band-Aids. He reports 2 rounds of antibiotics for this issue. He denies pain. He denies current acute signs of infection. 5/9; patient presents for 1 week follow-up.  7/11; patient presents for 1 week follow-up. He has been doing well with Hydrofera Blue under 3 layer compression. He reports improvement in pain since last clinic visit. He has no complaints today. He denies signs of infection. 7/18; patient presents for 1 week follow-up. Has been doing well with Hydrofera Blue under 3 layer compression. He denies signs of infection. Admission 05/24/2022 [name] is a 67 year old male with a past medical history of venous insufficiency that presents to the clinic for a 1 month history of nonhealing wound to his right lower extremity. He states that a wagon wheel hit it and caused a wound. He has been treated with Keflex for this issue By his podiatrist. He has been using silver alginate to the wound bed with dressing changes.  He currently denies signs of infection. He is not wearing compression stockings. 8/15; patient presents for follow-up. We have been using antibiotic ointment with Hydrofera Blue under 3 layer compression. He tolerated the compression wrap well. He has no issues or complaints today. He denies signs of infection. 8/22; patient presents for follow-up. We have been using Santyl and Hydrofera Blue under 3 layer compression. He has no issues or complaints today. 8/29; patient presents for follow-up. We have been using collagen to the wound bed under 3 layer compression. He has no issues or complaints today. 9/7; patient presents for follow-up. We have been using collagen under 3 layer compression. Patient has no issues or complaints today. 9/12; patient presents for follow-up. We have been using collagen under 3 layer compression. Patient has no issues or complaints today. 9/21; patient presents for follow-up. We have been using endoform under 3 layer compression. Patient has no issues or complaints today. He tolerated the wrap well. 10/2; patient presents for follow-up. We have been using Xeroform under 3 layer compression. The wound is almost healed. Patient denies signs of infection. 10/9; patient presents for follow-up. We have been using Hydrofera Blue under 3 layer compression. His wound is healed. He has compression stockings at home. READMISSION 07/02/2023 ***FORMAL ABI STUDIES 10/22/2022*** +-------+-----------+-----------+------------+------------+ ABI/TBIT oday's ABIT oday's TBIPrevious ABIPrevious TBI +-------+-----------+-----------+------------+------------+ Right [place] 0.92 1.33 0.77  +-------+-----------+-----------+------------+------------+ Left [place] 0.83 1.16 0.82  +-------+-----------+-----------+------------+------------+ He returns to clinic today with new wounds on his bilateral lower extremities. He suffered a fall at the end of July which resulted in an C7  nondisplaced fracture of the superior articular facet. He had bilateral knee lacerations and a left anterior tibial laceration. A recent CT scan also identified a subacute fracture through the C3 vertebral body. Neurosurgery evaluation is pending. The left knee laceration is closed. The right has an open area in the center with hypertrophic granulation tissue and slough. The left anterior tibial laceration is covered with a thick layer of rubbery slough. There is also a smaller proximal wound that has some slough and eschar present. Edema control is poor; he admits to not wearing his compression garments. He is also supposed to be wearing a hard cervical collar and does not have this on today either. Electronic Signature(s) Signed: 07/02/2023 1:21:16 PM By: Duanne Guess MD FACS Entered By: Duanne Guess on 07/02/2023 10:21:16 -------------------------------------------------------------------------------- Physical Exam Details Patient Name: Date of Service: [name] RD L. 07/02/2023 12:45 PM Medical Record Number: [id number] Patient Account Number: [id number] Date of Birth/Sex: Treating RN: 12/27/1955 (67 y.o. M) Primary Care Provider: [name] Other Clinician: Referring Provider: Treating Provider/Extender: Joeseph Amor in Treatment: 0 [place], [place] ([id number]) [id number] Page 5 of  Discharge Instructions: Apply Urgo K2 Lite as directed (alternative to 3 layer compression). Wound #6 - Lower Leg Wound Laterality: Left, Medial, Proximal Cleanser: Soap and Water 1 x Per Day/30 Days Discharge Instructions: May shower and wash wound with dial antibacterial soap and water prior to dressing change. Cleanser: Vashe 5.8 (oz) 1 x Per Day/30 Days Discharge Instructions: Cleanse the wound with Vashe prior to applying a clean dressing using gauze sponges,  not tissue or cotton balls. Peri-Wound Care: Sween Lotion (Moisturizing lotion) 1 x Per Day/30 Days Discharge Instructions: Apply moisturizing lotion as directed Prim Dressing: Hydrofera Blue Classic Foam, 4x4 in 1 x Per Day/30 Days ary Discharge Instructions: Moisten with saline prior to applying to wound bed Secondary Dressing: ABD Pad, 5x9 1 x Per Day/30 Days Discharge Instructions: Apply over primary dressing as directed. Secondary Dressing: Woven Gauze Sponge, Non-Sterile 4x4 in 1 x Per Day/30 Days Discharge Instructions: Apply over primary dressing as directed. Compression Wrap: Urgo K2 Lite, (equivalent to a 3 layer) two layer compression system, regular 1 x Per Day/30 Days Discharge Instructions: Apply Urgo K2 Lite as directed (alternative to 3 layer compression). Patient Medications llergies: adhesive tape, amiodarone, Statins-HMG-CoA Reductase Inhibitors, Sulfa (Sulfonamide Antibiotics) A Notifications Medication Indication Start End 07/02/2023 lidocaine DOSE topical 4 % cream - cream topical Electronic Signature(s) Signed: 07/02/2023 2:51:21 PM By: Duanne Guess MD FACS Signed: 07/02/2023 3:33:31 PM By: Samuella Bruin Previous Signature: 07/02/2023 1:30:34 PM Version By: Duanne Guess MD FACS Entered By: Samuella Bruin on 07/02/2023 10:35:24 -------------------------------------------------------------------------------- Problem List Details Patient Name: Date of Service: [name] RD L. 07/02/2023 12:45 PM Medical Record Number: [id number] Patient Account Number: [id number] Date of Birth/Sex: Treating RN: [birth year] (68 y.o. M) Primary Care Provider: [name] Other Clinician: Referring Provider: Treating Provider/Extender: Joeseph Amor in Treatment: 0 [place], [place] ([id number]) [id number] Page 7 of 14 Active Problems ICD-10 Encounter Code Description Active Date MDM Diagnosis L97.822 Non-pressure  chronic ulcer of other part of left lower leg with fat layer exposed9/17/2024 No Yes L97.812 Non-pressure chronic ulcer of other part of right lower leg with fat layer 07/02/2023 No Yes exposed E11.622 Type 2 diabetes mellitus with other skin ulcer 07/02/2023 No Yes I50.20 Unspecified systolic (congestive) heart failure 07/02/2023 No Yes Inactive Problems Resolved Problems Electronic Signature(s) Signed: 07/02/2023 1:01:28 PM By: Duanne Guess MD FACS Entered By: Duanne Guess on 07/02/2023 10:01:28 -------------------------------------------------------------------------------- Progress Note Details Patient Name: Date of Service: [name] RD L. 07/02/2023 12:45 PM Medical Record Number: [id number] Patient Account Number: [id number] Date of Birth/Sex: Treating RN: 09/10/1956 (67 y.o. M) Primary Care Provider: [name] Other Clinician: Referring Provider: Treating Provider/Extender: Joeseph Amor in Treatment: 0 Subjective Chief Complaint Information obtained from Patient 05/24/2022; right lower extremity wound 07/02/2023: returns with BLE wounds History of Present Illness (HPI) Admission 5/2 [name] is a 67 year old male with a past medical history of systolic congestive heart failure, COPD, nonischemic cardiomyopathy, type 2 diabetes, chronic venous insufficiency that presents to the clinic for a 28-month history of nonhealing wound to his left lower extremity. He states that in November 2021 he fell off his porch and Hit his leg against a brick. The wound has healed mostly except for 1 area that is still open. He reports moderate Serosanguineous drainage daily. He has been keeping it covered with Band-Aids. He reports 2 rounds of antibiotics for this issue. He denies pain. He denies current acute signs of infection. 5/9; patient presents for 1 week follow-up.  [name], [name] ([id number]) [id number] Page 1 of 14 Visit Report for 07/02/2023 Chief Complaint Document Details Patient Name: Date of Service: [name] RD L. 07/02/2023 12:45 PM Medical Record Number: [id number] Patient Account Number: [id number] Date of Birth/Sex: Treating RN: May 13, 1956 (67 y.o. M) Primary Care Provider: [name] Other Clinician: Referring Provider: Treating Provider/Extender: Joeseph Amor in Treatment: 0 Information Obtained from: Patient Chief Complaint 05/24/2022; right lower extremity wound 07/02/2023: returns with BLE wounds Electronic Signature(s) Signed: 07/02/2023 1:02:04 PM By: Duanne Guess MD FACS Entered By: Duanne Guess on 07/02/2023 10:02:04 -------------------------------------------------------------------------------- Debridement Details Patient Name: Date of Service: [name] RD L. 07/02/2023 12:45 PM Medical Record Number: [id number] Patient Account Number: [id number] Date of Birth/Sex: Treating RN: 1956/10/08 (67 y.o. [name] Primary Care Provider: [name] Other Clinician: Referring Provider: Treating Provider/Extender: Joeseph Amor in Treatment: 0 Debridement Performed for Assessment: Wound #5 Left,Distal,Medial Lower Leg Performed By: Physician Duanne Guess, MD The following information was scribed by: Samuella Bruin The information was scribed for: Duanne Guess Debridement Type: Debridement Severity of Tissue Pre Debridement: Fat layer exposed Level of Consciousness (Pre-procedure): Awake and Alert Pre-procedure Verification/Time Out Yes - 13:10 Taken: Start Time: 13:10 Pain Control: Lidocaine 4% Topical Solution Percent of Wound Bed Debrided: 100% T Area Debrided (cm): otal 11.23 Tissue and other material debrided: Non-Viable, Eschar, Slough, Subcutaneous, Slough Level: Skin/Subcutaneous Tissue Debridement Description:  Excisional Instrument: Curette Bleeding: Minimum Hemostasis Achieved: Pressure Response to Treatment: Procedure was tolerated well Level of Consciousness (Post- Awake and Alert procedure): Post Debridement Measurements of Total Wound Length: (cm) 5.5 Width: (cm) 2.6 Depth: (cm) 0.1 Volume: (cm) 1.123 Character of Wound/Ulcer Post Debridement: Improved [name], [name] ([id number]) [id number] Page 2 of 14 Severity of Tissue Post Debridement: Fat layer exposed Post Procedure Diagnosis Same as Pre-procedure Electronic Signature(s) Signed: 07/02/2023 1:30:34 PM By: Duanne Guess MD FACS Signed: 07/02/2023 3:33:31 PM By: Samuella Bruin Entered By: Samuella Bruin on 07/02/2023 10:11:57 -------------------------------------------------------------------------------- Debridement Details Patient Name: Date of Service: [name] RD L. 07/02/2023 12:45 PM Medical Record Number: [id number] Patient Account Number: [id number] Date of Birth/Sex: Treating RN: 12-Nov-1955 (67 y.o. [name] Primary Care Provider: [name] Other Clinician: Referring Provider: Treating Provider/Extender: Joeseph Amor in Treatment: 0 Debridement Performed for Assessment: Wound #6 Left,Proximal,Medial Lower Leg Performed By: Physician Duanne Guess, MD The following information was scribed by: Samuella Bruin The information was scribed for: Duanne Guess Debridement Type: Debridement Severity of Tissue Pre Debridement: Fat layer exposed Level of Consciousness (Pre-procedure): Awake and Alert Pre-procedure Verification/Time Out Yes - 13:10 Taken: Start Time: 13:10 Pain Control: Lidocaine 4% Topical Solution Percent of Wound Bed Debrided: 100% T Area Debrided (cm): otal 0.16 Tissue and other material debrided: Non-Viable, Eschar, Slough, Slough Level: Non-Viable Tissue Debridement Description: Selective/Open Wound Instrument:  Curette Bleeding: Minimum Hemostasis Achieved: Pressure Response to Treatment: Procedure was tolerated well Level of Consciousness (Post- Awake and Alert procedure): Post Debridement Measurements of Total Wound Length: (cm) 0.5 Width: (cm) 0.4 Depth: (cm) 0.1 Volume: (cm) 0.016 Character of Wound/Ulcer Post Debridement: Improved Severity of Tissue Post Debridement: Fat layer exposed Post Procedure Diagnosis Same as Pre-procedure Electronic Signature(s) Signed: 07/02/2023 1:30:34 PM By: Duanne Guess MD FACS Signed: 07/02/2023 3:33:31 PM By: Samuella Bruin Entered By: Samuella Bruin on 07/02/2023 10:13:45 [name], [name] ([id number]) [id number] Page 3 of 14 -------------------------------------------------------------------------------- Debridement Details Patient Name: Date of Service: [name] RD L. 07/02/2023 12:45 PM Medical Record  7/11; patient presents for 1 week follow-up. He has been doing well with Hydrofera Blue under 3 layer compression. He reports improvement in pain since last clinic visit. He has no complaints today. He denies signs of infection. 7/18; patient presents for 1 week follow-up. Has been doing well with Hydrofera Blue under 3 layer compression. He denies signs of infection. Admission 05/24/2022 [name] is a 67 year old male with a past medical history of venous insufficiency that presents to the clinic for a 1 month history of nonhealing wound to his right lower extremity. He states that a wagon wheel hit it and caused a wound. He has been treated with Keflex for this issue By his podiatrist. He has been using silver alginate to the wound bed with dressing changes.  He currently denies signs of infection. He is not wearing compression stockings. 8/15; patient presents for follow-up. We have been using antibiotic ointment with Hydrofera Blue under 3 layer compression. He tolerated the compression wrap well. He has no issues or complaints today. He denies signs of infection. 8/22; patient presents for follow-up. We have been using Santyl and Hydrofera Blue under 3 layer compression. He has no issues or complaints today. 8/29; patient presents for follow-up. We have been using collagen to the wound bed under 3 layer compression. He has no issues or complaints today. 9/7; patient presents for follow-up. We have been using collagen under 3 layer compression. Patient has no issues or complaints today. 9/12; patient presents for follow-up. We have been using collagen under 3 layer compression. Patient has no issues or complaints today. 9/21; patient presents for follow-up. We have been using endoform under 3 layer compression. Patient has no issues or complaints today. He tolerated the wrap well. 10/2; patient presents for follow-up. We have been using Xeroform under 3 layer compression. The wound is almost healed. Patient denies signs of infection. 10/9; patient presents for follow-up. We have been using Hydrofera Blue under 3 layer compression. His wound is healed. He has compression stockings at home. READMISSION 07/02/2023 ***FORMAL ABI STUDIES 10/22/2022*** +-------+-----------+-----------+------------+------------+ ABI/TBIT oday's ABIT oday's TBIPrevious ABIPrevious TBI +-------+-----------+-----------+------------+------------+ Right [place] 0.92 1.33 0.77  +-------+-----------+-----------+------------+------------+ Left [place] 0.83 1.16 0.82  +-------+-----------+-----------+------------+------------+ He returns to clinic today with new wounds on his bilateral lower extremities. He suffered a fall at the end of July which resulted in an C7  nondisplaced fracture of the superior articular facet. He had bilateral knee lacerations and a left anterior tibial laceration. A recent CT scan also identified a subacute fracture through the C3 vertebral body. Neurosurgery evaluation is pending. The left knee laceration is closed. The right has an open area in the center with hypertrophic granulation tissue and slough. The left anterior tibial laceration is covered with a thick layer of rubbery slough. There is also a smaller proximal wound that has some slough and eschar present. Edema control is poor; he admits to not wearing his compression garments. He is also supposed to be wearing a hard cervical collar and does not have this on today either. Electronic Signature(s) Signed: 07/02/2023 1:21:16 PM By: Duanne Guess MD FACS Entered By: Duanne Guess on 07/02/2023 10:21:16 -------------------------------------------------------------------------------- Physical Exam Details Patient Name: Date of Service: [name] RD L. 07/02/2023 12:45 PM Medical Record Number: [id number] Patient Account Number: [id number] Date of Birth/Sex: Treating RN: 12/27/1955 (67 y.o. M) Primary Care Provider: [name] Other Clinician: Referring Provider: Treating Provider/Extender: Joeseph Amor in Treatment: 0 [place], [place] ([id number]) [id number] Page 5 of  [name], [name] ([id number]) [id number] Page 1 of 14 Visit Report for 07/02/2023 Chief Complaint Document Details Patient Name: Date of Service: [name] RD L. 07/02/2023 12:45 PM Medical Record Number: [id number] Patient Account Number: [id number] Date of Birth/Sex: Treating RN: May 13, 1956 (67 y.o. M) Primary Care Provider: [name] Other Clinician: Referring Provider: Treating Provider/Extender: Joeseph Amor in Treatment: 0 Information Obtained from: Patient Chief Complaint 05/24/2022; right lower extremity wound 07/02/2023: returns with BLE wounds Electronic Signature(s) Signed: 07/02/2023 1:02:04 PM By: Duanne Guess MD FACS Entered By: Duanne Guess on 07/02/2023 10:02:04 -------------------------------------------------------------------------------- Debridement Details Patient Name: Date of Service: [name] RD L. 07/02/2023 12:45 PM Medical Record Number: [id number] Patient Account Number: [id number] Date of Birth/Sex: Treating RN: 1956/10/08 (67 y.o. [name] Primary Care Provider: [name] Other Clinician: Referring Provider: Treating Provider/Extender: Joeseph Amor in Treatment: 0 Debridement Performed for Assessment: Wound #5 Left,Distal,Medial Lower Leg Performed By: Physician Duanne Guess, MD The following information was scribed by: Samuella Bruin The information was scribed for: Duanne Guess Debridement Type: Debridement Severity of Tissue Pre Debridement: Fat layer exposed Level of Consciousness (Pre-procedure): Awake and Alert Pre-procedure Verification/Time Out Yes - 13:10 Taken: Start Time: 13:10 Pain Control: Lidocaine 4% Topical Solution Percent of Wound Bed Debrided: 100% T Area Debrided (cm): otal 11.23 Tissue and other material debrided: Non-Viable, Eschar, Slough, Subcutaneous, Slough Level: Skin/Subcutaneous Tissue Debridement Description:  Excisional Instrument: Curette Bleeding: Minimum Hemostasis Achieved: Pressure Response to Treatment: Procedure was tolerated well Level of Consciousness (Post- Awake and Alert procedure): Post Debridement Measurements of Total Wound Length: (cm) 5.5 Width: (cm) 2.6 Depth: (cm) 0.1 Volume: (cm) 1.123 Character of Wound/Ulcer Post Debridement: Improved [name], [name] ([id number]) [id number] Page 2 of 14 Severity of Tissue Post Debridement: Fat layer exposed Post Procedure Diagnosis Same as Pre-procedure Electronic Signature(s) Signed: 07/02/2023 1:30:34 PM By: Duanne Guess MD FACS Signed: 07/02/2023 3:33:31 PM By: Samuella Bruin Entered By: Samuella Bruin on 07/02/2023 10:11:57 -------------------------------------------------------------------------------- Debridement Details Patient Name: Date of Service: [name] RD L. 07/02/2023 12:45 PM Medical Record Number: [id number] Patient Account Number: [id number] Date of Birth/Sex: Treating RN: 12-Nov-1955 (67 y.o. [name] Primary Care Provider: [name] Other Clinician: Referring Provider: Treating Provider/Extender: Joeseph Amor in Treatment: 0 Debridement Performed for Assessment: Wound #6 Left,Proximal,Medial Lower Leg Performed By: Physician Duanne Guess, MD The following information was scribed by: Samuella Bruin The information was scribed for: Duanne Guess Debridement Type: Debridement Severity of Tissue Pre Debridement: Fat layer exposed Level of Consciousness (Pre-procedure): Awake and Alert Pre-procedure Verification/Time Out Yes - 13:10 Taken: Start Time: 13:10 Pain Control: Lidocaine 4% Topical Solution Percent of Wound Bed Debrided: 100% T Area Debrided (cm): otal 0.16 Tissue and other material debrided: Non-Viable, Eschar, Slough, Slough Level: Non-Viable Tissue Debridement Description: Selective/Open Wound Instrument:  Curette Bleeding: Minimum Hemostasis Achieved: Pressure Response to Treatment: Procedure was tolerated well Level of Consciousness (Post- Awake and Alert procedure): Post Debridement Measurements of Total Wound Length: (cm) 0.5 Width: (cm) 0.4 Depth: (cm) 0.1 Volume: (cm) 0.016 Character of Wound/Ulcer Post Debridement: Improved Severity of Tissue Post Debridement: Fat layer exposed Post Procedure Diagnosis Same as Pre-procedure Electronic Signature(s) Signed: 07/02/2023 1:30:34 PM By: Duanne Guess MD FACS Signed: 07/02/2023 3:33:31 PM By: Samuella Bruin Entered By: Samuella Bruin on 07/02/2023 10:13:45 [name], [name] ([id number]) [id number] Page 3 of 14 -------------------------------------------------------------------------------- Debridement Details Patient Name: Date of Service: [name] RD L. 07/02/2023 12:45 PM Medical Record  1 x Per Day/30 Days Discharge Instructions: Apply over primary dressing as directed. Com pression Wrap: Urgo K2 Lite, (equivalent to a 3 layer) two layer compression system, regular 1 x Per Day/30 Days Discharge Instructions: Apply Urgo K2 Lite as directed (alternative to 3 layer compression). WOUND #6: - Lower Leg Wound Laterality: Left, Medial, Proximal Cleanser: Soap and Water 1 x Per Day/30 Days Discharge Instructions: May shower and wash wound with dial antibacterial soap and water prior to dressing change. Cleanser: Vashe 5.8 (oz) 1 x Per Day/30 Days Discharge Instructions: Cleanse the wound with Vashe prior to applying a clean dressing using gauze sponges, not tissue or cotton balls. Peri-Wound Care: Sween Lotion (Moisturizing lotion) 1 x Per Day/30 Days Discharge Instructions: Apply moisturizing lotion as directed [name], [name] ([id number]) [id number] Page 11 of 14 Prim Dressing: Hydrofera Blue Classic Foam, 4x4 in 1 x Per Day/30 Days ary Discharge Instructions: Moisten with saline prior to applying to wound bed Secondary Dressing: ABD Pad, 5x9 1 x Per Day/30 Days Discharge Instructions: Apply over primary dressing as directed. Secondary Dressing: Woven Gauze Sponge, Non-Sterile 4x4 in 1 x Per Day/30 Days Discharge Instructions: Apply over primary dressing as directed. Com pression Wrap: Urgo K2 Lite, (equivalent to a 3 layer) two layer compression system, regular 1 x Per Day/30 Days Discharge Instructions: Apply Urgo K2 Lite as directed (alternative to 3 layer  compression). 07/02/2023: The right knee has an open area in the center with hypertrophic granulation tissue and slough. The left anterior tibial laceration is covered with a thick layer of rubbery slough. There is also a smaller proximal wound that has some slough and eschar present. Edema control is poor. I used a curette to debride slough and subcutaneous tissue from the right knee and the larger of the left anterior tibial wounds. I debrided slough and eschar from the small proximal wound. I going to use Hydrofera Blue classic to all of the sites. We will apply an Urgo lite wrap on the left leg. He was instructed to wear his compression stocking on the right leg. He will change the wound dressing on his right knee daily. Follow-up in 1 week. Electronic Signature(s) Signed: 07/02/2023 2:51:21 PM By: Duanne Guess MD FACS Signed: 07/02/2023 3:33:31 PM By: Samuella Bruin Previous Signature: 07/02/2023 1:24:52 PM Version By: Duanne Guess MD FACS Entered By: Samuella Bruin on 07/02/2023 10:37:38 -------------------------------------------------------------------------------- HxROS Details Patient Name: Date of Service: [name] RD L. 07/02/2023 12:45 PM Medical Record Number: [id number] Patient Account Number: [id number] Date of Birth/Sex: Treating RN: 1956-01-26 (67 y.o. [name] Primary Care Provider: [name] Other Clinician: Referring Provider: Treating Provider/Extender: Joeseph Amor in Treatment: 0 Information Obtained From Patient Chart Constitutional Symptoms (General Health) Complaints and Symptoms: Negative for: Fatigue; Fever; Chills; Marked Weight Change Medical History: Past Medical History Notes: obesity Ear/Nose/Mouth/Throat Complaints and Symptoms: Negative for: Chronic sinus problems or rhinitis Integumentary (Skin) Complaints and Symptoms: Positive for: Wounds Medical History: Negative for: History of  Burn Eyes Medical History: Positive for: Cataracts; Glaucoma Negative for: Optic Neuritis Hematologic/Lymphatic Medical History: Positive for: Anemia [name], [name] ([id number]) [id number] Page 12 of 14 Respiratory Medical History: Positive for: Asthma; Chronic Obstructive Pulmonary Disease (COPD); Sleep Apnea - sleeps in recliner Negative for: Pneumothorax Past Medical History Notes: O2 dependent Cardiovascular Medical History: Positive for: Arrhythmia - afib; Congestive Heart Failure; Deep Vein Thrombosis; Myocardial Infarction; Peripheral Venous Disease Past Medical History Notes: cardiomyopathy, pacemaker/defibulator Gastrointestinal Medical History: Past Medical History Notes: GERD  [name], [name] ([id number]) [id number] Page 1 of 14 Visit Report for 07/02/2023 Chief Complaint Document Details Patient Name: Date of Service: [name] RD L. 07/02/2023 12:45 PM Medical Record Number: [id number] Patient Account Number: [id number] Date of Birth/Sex: Treating RN: May 13, 1956 (67 y.o. M) Primary Care Provider: [name] Other Clinician: Referring Provider: Treating Provider/Extender: Joeseph Amor in Treatment: 0 Information Obtained from: Patient Chief Complaint 05/24/2022; right lower extremity wound 07/02/2023: returns with BLE wounds Electronic Signature(s) Signed: 07/02/2023 1:02:04 PM By: Duanne Guess MD FACS Entered By: Duanne Guess on 07/02/2023 10:02:04 -------------------------------------------------------------------------------- Debridement Details Patient Name: Date of Service: [name] RD L. 07/02/2023 12:45 PM Medical Record Number: [id number] Patient Account Number: [id number] Date of Birth/Sex: Treating RN: 1956/10/08 (67 y.o. [name] Primary Care Provider: [name] Other Clinician: Referring Provider: Treating Provider/Extender: Joeseph Amor in Treatment: 0 Debridement Performed for Assessment: Wound #5 Left,Distal,Medial Lower Leg Performed By: Physician Duanne Guess, MD The following information was scribed by: Samuella Bruin The information was scribed for: Duanne Guess Debridement Type: Debridement Severity of Tissue Pre Debridement: Fat layer exposed Level of Consciousness (Pre-procedure): Awake and Alert Pre-procedure Verification/Time Out Yes - 13:10 Taken: Start Time: 13:10 Pain Control: Lidocaine 4% Topical Solution Percent of Wound Bed Debrided: 100% T Area Debrided (cm): otal 11.23 Tissue and other material debrided: Non-Viable, Eschar, Slough, Subcutaneous, Slough Level: Skin/Subcutaneous Tissue Debridement Description:  Excisional Instrument: Curette Bleeding: Minimum Hemostasis Achieved: Pressure Response to Treatment: Procedure was tolerated well Level of Consciousness (Post- Awake and Alert procedure): Post Debridement Measurements of Total Wound Length: (cm) 5.5 Width: (cm) 2.6 Depth: (cm) 0.1 Volume: (cm) 1.123 Character of Wound/Ulcer Post Debridement: Improved [name], [name] ([id number]) [id number] Page 2 of 14 Severity of Tissue Post Debridement: Fat layer exposed Post Procedure Diagnosis Same as Pre-procedure Electronic Signature(s) Signed: 07/02/2023 1:30:34 PM By: Duanne Guess MD FACS Signed: 07/02/2023 3:33:31 PM By: Samuella Bruin Entered By: Samuella Bruin on 07/02/2023 10:11:57 -------------------------------------------------------------------------------- Debridement Details Patient Name: Date of Service: [name] RD L. 07/02/2023 12:45 PM Medical Record Number: [id number] Patient Account Number: [id number] Date of Birth/Sex: Treating RN: 12-Nov-1955 (67 y.o. [name] Primary Care Provider: [name] Other Clinician: Referring Provider: Treating Provider/Extender: Joeseph Amor in Treatment: 0 Debridement Performed for Assessment: Wound #6 Left,Proximal,Medial Lower Leg Performed By: Physician Duanne Guess, MD The following information was scribed by: Samuella Bruin The information was scribed for: Duanne Guess Debridement Type: Debridement Severity of Tissue Pre Debridement: Fat layer exposed Level of Consciousness (Pre-procedure): Awake and Alert Pre-procedure Verification/Time Out Yes - 13:10 Taken: Start Time: 13:10 Pain Control: Lidocaine 4% Topical Solution Percent of Wound Bed Debrided: 100% T Area Debrided (cm): otal 0.16 Tissue and other material debrided: Non-Viable, Eschar, Slough, Slough Level: Non-Viable Tissue Debridement Description: Selective/Open Wound Instrument:  Curette Bleeding: Minimum Hemostasis Achieved: Pressure Response to Treatment: Procedure was tolerated well Level of Consciousness (Post- Awake and Alert procedure): Post Debridement Measurements of Total Wound Length: (cm) 0.5 Width: (cm) 0.4 Depth: (cm) 0.1 Volume: (cm) 0.016 Character of Wound/Ulcer Post Debridement: Improved Severity of Tissue Post Debridement: Fat layer exposed Post Procedure Diagnosis Same as Pre-procedure Electronic Signature(s) Signed: 07/02/2023 1:30:34 PM By: Duanne Guess MD FACS Signed: 07/02/2023 3:33:31 PM By: Samuella Bruin Entered By: Samuella Bruin on 07/02/2023 10:13:45 [name], [name] ([id number]) [id number] Page 3 of 14 -------------------------------------------------------------------------------- Debridement Details Patient Name: Date of Service: [name] RD L. 07/02/2023 12:45 PM Medical Record  7/11; patient presents for 1 week follow-up. He has been doing well with Hydrofera Blue under 3 layer compression. He reports improvement in pain since last clinic visit. He has no complaints today. He denies signs of infection. 7/18; patient presents for 1 week follow-up. Has been doing well with Hydrofera Blue under 3 layer compression. He denies signs of infection. Admission 05/24/2022 [name] is a 67 year old male with a past medical history of venous insufficiency that presents to the clinic for a 1 month history of nonhealing wound to his right lower extremity. He states that a wagon wheel hit it and caused a wound. He has been treated with Keflex for this issue By his podiatrist. He has been using silver alginate to the wound bed with dressing changes.  He currently denies signs of infection. He is not wearing compression stockings. 8/15; patient presents for follow-up. We have been using antibiotic ointment with Hydrofera Blue under 3 layer compression. He tolerated the compression wrap well. He has no issues or complaints today. He denies signs of infection. 8/22; patient presents for follow-up. We have been using Santyl and Hydrofera Blue under 3 layer compression. He has no issues or complaints today. 8/29; patient presents for follow-up. We have been using collagen to the wound bed under 3 layer compression. He has no issues or complaints today. 9/7; patient presents for follow-up. We have been using collagen under 3 layer compression. Patient has no issues or complaints today. 9/12; patient presents for follow-up. We have been using collagen under 3 layer compression. Patient has no issues or complaints today. 9/21; patient presents for follow-up. We have been using endoform under 3 layer compression. Patient has no issues or complaints today. He tolerated the wrap well. 10/2; patient presents for follow-up. We have been using Xeroform under 3 layer compression. The wound is almost healed. Patient denies signs of infection. 10/9; patient presents for follow-up. We have been using Hydrofera Blue under 3 layer compression. His wound is healed. He has compression stockings at home. READMISSION 07/02/2023 ***FORMAL ABI STUDIES 10/22/2022*** +-------+-----------+-----------+------------+------------+ ABI/TBIT oday's ABIT oday's TBIPrevious ABIPrevious TBI +-------+-----------+-----------+------------+------------+ Right [place] 0.92 1.33 0.77  +-------+-----------+-----------+------------+------------+ Left [place] 0.83 1.16 0.82  +-------+-----------+-----------+------------+------------+ He returns to clinic today with new wounds on his bilateral lower extremities. He suffered a fall at the end of July which resulted in an C7  nondisplaced fracture of the superior articular facet. He had bilateral knee lacerations and a left anterior tibial laceration. A recent CT scan also identified a subacute fracture through the C3 vertebral body. Neurosurgery evaluation is pending. The left knee laceration is closed. The right has an open area in the center with hypertrophic granulation tissue and slough. The left anterior tibial laceration is covered with a thick layer of rubbery slough. There is also a smaller proximal wound that has some slough and eschar present. Edema control is poor; he admits to not wearing his compression garments. He is also supposed to be wearing a hard cervical collar and does not have this on today either. Electronic Signature(s) Signed: 07/02/2023 1:21:16 PM By: Duanne Guess MD FACS Entered By: Duanne Guess on 07/02/2023 10:21:16 -------------------------------------------------------------------------------- Physical Exam Details Patient Name: Date of Service: [name] RD L. 07/02/2023 12:45 PM Medical Record Number: [id number] Patient Account Number: [id number] Date of Birth/Sex: Treating RN: 12/27/1955 (67 y.o. M) Primary Care Provider: [name] Other Clinician: Referring Provider: Treating Provider/Extender: Joeseph Amor in Treatment: [address] [place], [place] ([id number]) [id number] Page 5 of

## 2023-07-03 ENCOUNTER — Telehealth: Payer: Self-pay | Admitting: Internal Medicine

## 2023-07-03 NOTE — Telephone Encounter (Signed)
Pharmacy was already updated at Pt appt.

## 2023-07-03 NOTE — Telephone Encounter (Signed)
Pt pharmacy is [facility] on [address].[name] He received his last medication request but the pharmacy I just mentioned will need to be the current pharmacy he will be moving fwd with.

## 2023-07-03 NOTE — Telephone Encounter (Signed)
Pt would to have all his medications going to  [email or web address] [address], [place], [place] [address]  5.2 mi [phone]

## 2023-07-04 ENCOUNTER — Telehealth (HOSPITAL_BASED_OUTPATIENT_CLINIC_OR_DEPARTMENT_OTHER): Payer: Self-pay | Admitting: Pulmonary Disease

## 2023-07-04 ENCOUNTER — Encounter: Payer: Self-pay | Admitting: Internal Medicine

## 2023-07-04 NOTE — Telephone Encounter (Signed)
His pharmacy was updated at his appt.

## 2023-07-04 NOTE — Assessment & Plan Note (Signed)
Last vitamin D Lab Results  Component Value Date   VD25OH 22.51 (L) 07/01/2023   Low, to start oral replacement

## 2023-07-04 NOTE — Assessment & Plan Note (Signed)
With iron deficiency - for f/u lab today

## 2023-07-04 NOTE — Assessment & Plan Note (Signed)
With persistent pain and impairment, for MRI c spine, pain control, f/u NS oct 16 and rehab late oct, to d/c goody powder and ibuprofen due to GI risk

## 2023-07-04 NOTE — Telephone Encounter (Signed)
Patient states he believes he may have thrush. Patient states his throat is in a lot of pain and is now hurting to swallow. Patient has been taking Spiriva since last OV, but does not feel it has caused his throat pain and washes his mouth out after every use. Please advise.   Pharmacy: [facility] [address]

## 2023-07-04 NOTE — Assessment & Plan Note (Signed)
Lab Results  Component Value Date   HGBA1C 5.9 07/01/2023   Stable, pt to continue current medical treatment  - diet, wt control

## 2023-07-04 NOTE — Assessment & Plan Note (Signed)
Lab Results  Component Value Date   VITAMINB12 752 07/01/2023   Stable, cont oral replacement - b12 1000 mcg qd

## 2023-07-05 ENCOUNTER — Telehealth: Payer: Self-pay | Admitting: Internal Medicine

## 2023-07-05 ENCOUNTER — Encounter (HOSPITAL_BASED_OUTPATIENT_CLINIC_OR_DEPARTMENT_OTHER): Payer: Self-pay

## 2023-07-05 NOTE — Telephone Encounter (Signed)
Patient returned [name]'s call about his lab results and would like a call back at [phone].

## 2023-07-05 NOTE — Telephone Encounter (Signed)
Called and went over Pt lab results.

## 2023-07-05 NOTE — Telephone Encounter (Signed)
Called Pt back and went over results.

## 2023-07-08 ENCOUNTER — Ambulatory Visit: Payer: Medicare HMO | Attending: Internal Medicine

## 2023-07-08 DIAGNOSIS — I5022 Chronic systolic (congestive) heart failure: Secondary | ICD-10-CM

## 2023-07-08 DIAGNOSIS — Z95810 Presence of automatic (implantable) cardiac defibrillator: Secondary | ICD-10-CM

## 2023-07-08 NOTE — Addendum Note (Signed)
Addended by: [name] on: 07/08/2023 07:03 PM   Modules accepted: Orders

## 2023-07-09 ENCOUNTER — Other Ambulatory Visit: Payer: Self-pay | Admitting: Internal Medicine

## 2023-07-09 ENCOUNTER — Other Ambulatory Visit: Payer: Self-pay

## 2023-07-09 ENCOUNTER — Encounter (HOSPITAL_BASED_OUTPATIENT_CLINIC_OR_DEPARTMENT_OTHER): Payer: Medicare HMO | Admitting: General Surgery

## 2023-07-09 DIAGNOSIS — I428 Other cardiomyopathies: Secondary | ICD-10-CM | POA: Diagnosis not present

## 2023-07-09 DIAGNOSIS — L97812 Non-pressure chronic ulcer of other part of right lower leg with fat layer exposed: Secondary | ICD-10-CM | POA: Diagnosis not present

## 2023-07-09 DIAGNOSIS — I5022 Chronic systolic (congestive) heart failure: Secondary | ICD-10-CM | POA: Diagnosis not present

## 2023-07-09 DIAGNOSIS — L97822 Non-pressure chronic ulcer of other part of left lower leg with fat layer exposed: Secondary | ICD-10-CM | POA: Diagnosis not present

## 2023-07-09 DIAGNOSIS — J4489 Other specified chronic obstructive pulmonary disease: Secondary | ICD-10-CM | POA: Diagnosis not present

## 2023-07-09 DIAGNOSIS — Z87891 Personal history of nicotine dependence: Secondary | ICD-10-CM | POA: Diagnosis not present

## 2023-07-09 DIAGNOSIS — E11622 Type 2 diabetes mellitus with other skin ulcer: Secondary | ICD-10-CM | POA: Diagnosis not present

## 2023-07-09 DIAGNOSIS — E669 Obesity, unspecified: Secondary | ICD-10-CM | POA: Diagnosis not present

## 2023-07-09 DIAGNOSIS — E1140 Type 2 diabetes mellitus with diabetic neuropathy, unspecified: Secondary | ICD-10-CM | POA: Diagnosis not present

## 2023-07-09 NOTE — Progress Notes (Signed)
14:39:04 -------------------------------------------------------------------------------- Multi Wound Chart Details Patient Name: Date of Service: [name] [name]. 07/09/2023 2:45 PM Medical Record Number: [id number] Patient Account Number: [id number] Date of Birth/Sex: Treating RN: 1956-07-30 (67 y.o. M) Primary Care [name]: [name] Other Clinician: Referring [name]: Treating [name]/Extender: [name] in Treatment: 1 Vital Signs Height(in): Pulse(bpm): 78 Weight(lbs): Blood Pressure(mmHg): 112/73 Body Mass Index(BMI): Temperature(F): 97.8 Respiratory Rate(breaths/min): 20 [4:Photos:] Right Knee Left, Distal, Medial Lower Leg Left, Proximal, Medial Lower Leg Wound Location: Trauma Trauma Trauma Wounding Event: Diabetic Wound/Ulcer of the Lower Diabetic Wound/Ulcer of the Lower Diabetic Wound/Ulcer of the Lower Primary Etiology: Extremity Extremity Extremity Cataracts, Glaucoma, Anemia, Cataracts, Glaucoma, Anemia, Cataracts, Glaucoma, Anemia, Comorbid History: Asthma, Chronic Obstructive Asthma, Chronic Obstructive Asthma, Chronic  Obstructive Pulmonary Disease (COPD), Sleep Pulmonary Disease (COPD), Sleep Pulmonary Disease (COPD), Sleep Apnea, Arrhythmia, Congestive Heart Apnea, Arrhythmia, Congestive Heart Apnea, Arrhythmia, Congestive Heart Failure, Deep Vein Thrombosis, Failure, Deep Vein Thrombosis, Failure, Deep Vein Thrombosis, Myocardial Infarction, Peripheral Myocardial Infarction, Peripheral Myocardial Infarction, Peripheral Venous Disease, Type II Diabetes, Venous Disease, Type II Diabetes, Venous Disease, Type II Diabetes, Osteoarthritis, Neuropathy Osteoarthritis, Neuropathy Osteoarthritis, Neuropathy 05/14/2023 05/14/2023 05/14/2023 Date Acquired: 1 1 1  Weeks of Treatment: Open Open Open Wound Status: No No No Wound Recurrence: 0.5x1.2x0.1 4.7x2x0.1 0x0x0 Measurements [name] x W x D (cm) 0.471 7.383 0 A (cm) : rea 0.047 0.738 0 Volume (cm) : 36.90% 34.30% 100.00% % Reduction in A rea: 37.30% 34.30% 100.00% % Reduction in Volume: Grade 1 Grade 1 Grade 1 Classification: Medium Medium None Present Exudate A mount: Serosanguineous Serosanguineous N/A Exudate Type: red, brown red, brown N/A Exudate Color: Distinct, outline attached Distinct, outline attached Distinct, outline attached Wound Margin: Large (67-100%) Large (67-100%) None Present (0%) Granulation A mount: Red Red, Pink N/A Granulation Quality: Small (1-33%) Small (1-33%) None Present (0%) Necrotic A mount: Fat Layer (Subcutaneous Tissue): Yes Fat Layer (Subcutaneous Tissue): Yes Fascia: No Exposed Structures: Fascia: No Fascia: No Fat Layer (Subcutaneous Tissue): No Tendon: No Tendon: No Tendon: No Muscle: No Muscle: No Muscle: No Joint: No Joint: No Joint: No Bone: No Bone: No Bone: No Small (1-33%) Small (1-33%) Large (67-100%) Epithelialization: Debridement - Selective/Open Wound Debridement - Selective/Open Wound N/A Debridement: Pre-procedure Verification/Time Out 14:50 14:50 N/A Taken: Lidocaine 4% Topical  Solution Lidocaine 4% Topical Solution N/A Pain Control: [facility] N/A Tissue Debrided: Non-Viable Tissue Non-Viable Tissue N/A Level: [name], [name] ([id number]) [phone].pdf Page 4 of 10 0.47 7.38 N/A Debridement A (sq cm): rea Curette Curette N/A Instrument: Minimum Minimum N/A Bleeding: Silver Nitrate Pressure N/A Hemostasis Achieved: 0 0 N/A Procedural Pain: 0 0 N/A Post Procedural Pain: Procedure was tolerated well Procedure was tolerated well N/A Debridement Treatment Response: 0.5x1.2x0.1 4.7x2x0.1 N/A Post Debridement Measurements [name] x W x D (cm) 0.047 0.738 N/A Post Debridement Volume: (cm) No Abnormalities Noted No Abnormalities Noted No Abnormalities Noted Periwound Skin Texture: Dry/Scaly: Yes No Abnormalities Noted No Abnormalities Noted Periwound Skin Moisture: No Abnormalities Noted Hemosiderin Staining: Yes No Abnormalities Noted Periwound Skin Color: Rubor: Yes No Abnormality No Abnormality No Abnormality Temperature: Debridement Compression Therapy N/A Procedures Performed: Debridement Treatment Notes Electronic Signature(s) Signed: 07/09/2023 2:56:47 PM By: Zachary Guess MD FACS Entered By: [name] on 07/09/2023 14:56:47 -------------------------------------------------------------------------------- Multi-Disciplinary Care Plan Details Patient Name: Date of Service: [name] [name]. 07/09/2023 2:45 PM Medical Record Number: [id number] Patient Account Number: [id number] Date of Birth/Sex: Treating RN: Apr 06, 1956 (67 y.o. [name] Primary Care [name]: [name],  Fayrene Fearing Other Clinician: Referring [name]: Treating [name]/Extender: [name] in Treatment: 1 Multidisciplinary Care Plan reviewed with physician Active Inactive Abuse / Safety / Falls / Self Care Management Nursing Diagnoses: Impaired physical mobility Knowledge deficit related to: safety; personal, health  (wound), emergency Potential for falls Goals: Patient/caregiver will verbalize/demonstrate measures taken to improve the patient's personal safety Date Initiated: 07/02/2023 Target Resolution Date: 08/23/2023 Goal Status: Active Patient/caregiver will verbalize/demonstrate measures taken to prevent injury and/or falls Date Initiated: 07/02/2023 Target Resolution Date: 08/23/2023 Goal Status: Active Interventions: Assess fall risk on admission and as needed Assess self care needs on admission and as needed Provide education on fall prevention Notes: Wound/Skin Impairment Nursing Diagnoses: Impaired tissue integrity Knowledge deficit related to ulceration/compromised skin integrity Goals: Patient/caregiver will verbalize understanding of skin care regimen [name], [name] ([id number]) [phone].pdf Page 5 of 10 Date Initiated: 07/02/2023 Target Resolution Date: 08/23/2023 Goal Status: Active Interventions: Assess patient/caregiver ability to obtain necessary supplies Assess patient/caregiver ability to perform ulcer/skin care regimen upon admission and as needed Assess ulceration(s) every visit Treatment Activities: Referred to DME Julieana Eshleman for dressing supplies : 07/02/2023 Skin care regimen initiated : 07/02/2023 Topical wound management initiated : 07/02/2023 Notes: Electronic Signature(s) Signed: 07/09/2023 3:46:08 PM By: Zenaida Deed RN, BSN Entered By: Zenaida Deed on 07/09/2023 14:51:22 -------------------------------------------------------------------------------- Pain Assessment Details Patient Name: Date of Service: [name] [name]. 07/09/2023 2:45 PM Medical Record Number: [id number] Patient Account Number: [id number] Date of Birth/Sex: Treating RN: [birth year] (67 y.o. [name] Primary Care Joliene Salvador: [name] Other Clinician: Referring Greta Yung: Treating Zailen Albarran/Extender: [name] in Treatment:  1 Active Problems Location of Pain Severity and Description of Pain Patient Has Paino No Site Locations Rate the pain. Current Pain Level: 0 Pain Management and Medication Current Pain Management: Electronic Signature(s) Signed: 07/09/2023 2:53:10 PM By: Samuella Bruin Entered By: Samuella Bruin on 07/09/2023 14:31:54 [name], Zachary Lango ([id number]) [id number].pdf Page 6 of 10 -------------------------------------------------------------------------------- Patient/Caregiver Education Details Patient Name: Date of Service: [name] [name]. 9/24/2024andnbsp2:45 PM Medical Record Number: [id number] Patient Account Number: [id number] Date of Birth/Gender: Treating RN: Mar 28, 1956 (67 y.o. [name] Primary Care Physician: [name] Other Clinician: Referring Physician: Treating Physician/Extender: [name] in Treatment: 1 Education Assessment Education Provided To: Patient Education Topics Provided Venous: Methods: Explain/Verbal Responses: Reinforcements needed, State content correctly Wound/Skin Impairment: Methods: Explain/Verbal Responses: Reinforcements needed, State content correctly Electronic Signature(s) Signed: 07/09/2023 3:46:08 PM By: Zenaida Deed RN, BSN Entered By: Zenaida Deed on 07/09/2023 14:51:45 -------------------------------------------------------------------------------- Wound Assessment Details Patient Name: Date of Service: [name] [name]. 07/09/2023 2:45 PM Medical Record Number: [id number] Patient Account Number: [id number] Date of Birth/Sex: Treating RN: 07-22-1956 (67 y.o. [name] Primary Care Twinkle Sockwell: [name] Other Clinician: Referring Willliam Pettet: Treating Sharmayne Jablon/Extender: [name] in Treatment: 1 Wound Status Wound Number: 4 Primary Diabetic Wound/Ulcer of the Lower Extremity Etiology: Wound Location: Right Knee Wound  Open Wounding Event: Trauma Status: Date Acquired: 05/14/2023 Comorbid Cataracts, Glaucoma, Anemia, Asthma, Chronic Obstructive Weeks Of Treatment: 1 History: Pulmonary Disease (COPD), Sleep Apnea, Arrhythmia, Congestive Clustered Wound: No Heart Failure, Deep Vein Thrombosis, Myocardial Infarction, Peripheral Venous Disease, Type II Diabetes, Osteoarthritis, Neuropathy Photos Wound Measurements Length: (cm) 0.5 Width: (cm) 1.2 [name], [name] ([id number]) Depth: (cm) Area: (cm) Volume: (cm) % Reduction in Area: 36.9% % Reduction in Volume: 37.3% [id number] Page 7 of 10 0.1 Epithelialization: Small (1-33%) 0.471 Tunneling: No 0.047 Undermining: No Wound  14:39:04 -------------------------------------------------------------------------------- Multi Wound Chart Details Patient Name: Date of Service: [name] [name]. 07/09/2023 2:45 PM Medical Record Number: [id number] Patient Account Number: [id number] Date of Birth/Sex: Treating RN: 1956-07-30 (67 y.o. M) Primary Care [name]: [name] Other Clinician: Referring [name]: Treating [name]/Extender: [name] in Treatment: 1 Vital Signs Height(in): Pulse(bpm): 78 Weight(lbs): Blood Pressure(mmHg): 112/73 Body Mass Index(BMI): Temperature(F): 97.8 Respiratory Rate(breaths/min): 20 [4:Photos:] Right Knee Left, Distal, Medial Lower Leg Left, Proximal, Medial Lower Leg Wound Location: Trauma Trauma Trauma Wounding Event: Diabetic Wound/Ulcer of the Lower Diabetic Wound/Ulcer of the Lower Diabetic Wound/Ulcer of the Lower Primary Etiology: Extremity Extremity Extremity Cataracts, Glaucoma, Anemia, Cataracts, Glaucoma, Anemia, Cataracts, Glaucoma, Anemia, Comorbid History: Asthma, Chronic Obstructive Asthma, Chronic Obstructive Asthma, Chronic  Obstructive Pulmonary Disease (COPD), Sleep Pulmonary Disease (COPD), Sleep Pulmonary Disease (COPD), Sleep Apnea, Arrhythmia, Congestive Heart Apnea, Arrhythmia, Congestive Heart Apnea, Arrhythmia, Congestive Heart Failure, Deep Vein Thrombosis, Failure, Deep Vein Thrombosis, Failure, Deep Vein Thrombosis, Myocardial Infarction, Peripheral Myocardial Infarction, Peripheral Myocardial Infarction, Peripheral Venous Disease, Type II Diabetes, Venous Disease, Type II Diabetes, Venous Disease, Type II Diabetes, Osteoarthritis, Neuropathy Osteoarthritis, Neuropathy Osteoarthritis, Neuropathy 05/14/2023 05/14/2023 05/14/2023 Date Acquired: 1 1 1  Weeks of Treatment: Open Open Open Wound Status: No No No Wound Recurrence: 0.5x1.2x0.1 4.7x2x0.1 0x0x0 Measurements [name] x W x D (cm) 0.471 7.383 0 A (cm) : rea 0.047 0.738 0 Volume (cm) : 36.90% 34.30% 100.00% % Reduction in A rea: 37.30% 34.30% 100.00% % Reduction in Volume: Grade 1 Grade 1 Grade 1 Classification: Medium Medium None Present Exudate A mount: Serosanguineous Serosanguineous N/A Exudate Type: red, brown red, brown N/A Exudate Color: Distinct, outline attached Distinct, outline attached Distinct, outline attached Wound Margin: Large (67-100%) Large (67-100%) None Present (0%) Granulation A mount: Red Red, Pink N/A Granulation Quality: Small (1-33%) Small (1-33%) None Present (0%) Necrotic A mount: Fat Layer (Subcutaneous Tissue): Yes Fat Layer (Subcutaneous Tissue): Yes Fascia: No Exposed Structures: Fascia: No Fascia: No Fat Layer (Subcutaneous Tissue): No Tendon: No Tendon: No Tendon: No Muscle: No Muscle: No Muscle: No Joint: No Joint: No Joint: No Bone: No Bone: No Bone: No Small (1-33%) Small (1-33%) Large (67-100%) Epithelialization: Debridement - Selective/Open Wound Debridement - Selective/Open Wound N/A Debridement: Pre-procedure Verification/Time Out 14:50 14:50 N/A Taken: Lidocaine 4% Topical  Solution Lidocaine 4% Topical Solution N/A Pain Control: [facility] N/A Tissue Debrided: Non-Viable Tissue Non-Viable Tissue N/A Level: [name], [name] ([id number]) [phone].pdf Page 4 of 10 0.47 7.38 N/A Debridement A (sq cm): rea Curette Curette N/A Instrument: Minimum Minimum N/A Bleeding: Silver Nitrate Pressure N/A Hemostasis Achieved: 0 0 N/A Procedural Pain: 0 0 N/A Post Procedural Pain: Procedure was tolerated well Procedure was tolerated well N/A Debridement Treatment Response: 0.5x1.2x0.1 4.7x2x0.1 N/A Post Debridement Measurements [name] x W x D (cm) 0.047 0.738 N/A Post Debridement Volume: (cm) No Abnormalities Noted No Abnormalities Noted No Abnormalities Noted Periwound Skin Texture: Dry/Scaly: Yes No Abnormalities Noted No Abnormalities Noted Periwound Skin Moisture: No Abnormalities Noted Hemosiderin Staining: Yes No Abnormalities Noted Periwound Skin Color: Rubor: Yes No Abnormality No Abnormality No Abnormality Temperature: Debridement Compression Therapy N/A Procedures Performed: Debridement Treatment Notes Electronic Signature(s) Signed: 07/09/2023 2:56:47 PM By: Zachary Guess MD FACS Entered By: [name] on 07/09/2023 14:56:47 -------------------------------------------------------------------------------- Multi-Disciplinary Care Plan Details Patient Name: Date of Service: [name] [name]. 07/09/2023 2:45 PM Medical Record Number: [id number] Patient Account Number: [id number] Date of Birth/Sex: Treating RN: Apr 06, 1956 (67 y.o. [name] Primary Care [name]: [name],  14:39:04 -------------------------------------------------------------------------------- Multi Wound Chart Details Patient Name: Date of Service: [name] [name]. 07/09/2023 2:45 PM Medical Record Number: [id number] Patient Account Number: [id number] Date of Birth/Sex: Treating RN: 1956-07-30 (67 y.o. M) Primary Care [name]: [name] Other Clinician: Referring [name]: Treating [name]/Extender: [name] in Treatment: 1 Vital Signs Height(in): Pulse(bpm): 78 Weight(lbs): Blood Pressure(mmHg): 112/73 Body Mass Index(BMI): Temperature(F): 97.8 Respiratory Rate(breaths/min): 20 [4:Photos:] Right Knee Left, Distal, Medial Lower Leg Left, Proximal, Medial Lower Leg Wound Location: Trauma Trauma Trauma Wounding Event: Diabetic Wound/Ulcer of the Lower Diabetic Wound/Ulcer of the Lower Diabetic Wound/Ulcer of the Lower Primary Etiology: Extremity Extremity Extremity Cataracts, Glaucoma, Anemia, Cataracts, Glaucoma, Anemia, Cataracts, Glaucoma, Anemia, Comorbid History: Asthma, Chronic Obstructive Asthma, Chronic Obstructive Asthma, Chronic  Obstructive Pulmonary Disease (COPD), Sleep Pulmonary Disease (COPD), Sleep Pulmonary Disease (COPD), Sleep Apnea, Arrhythmia, Congestive Heart Apnea, Arrhythmia, Congestive Heart Apnea, Arrhythmia, Congestive Heart Failure, Deep Vein Thrombosis, Failure, Deep Vein Thrombosis, Failure, Deep Vein Thrombosis, Myocardial Infarction, Peripheral Myocardial Infarction, Peripheral Myocardial Infarction, Peripheral Venous Disease, Type II Diabetes, Venous Disease, Type II Diabetes, Venous Disease, Type II Diabetes, Osteoarthritis, Neuropathy Osteoarthritis, Neuropathy Osteoarthritis, Neuropathy 05/14/2023 05/14/2023 05/14/2023 Date Acquired: 1 1 1  Weeks of Treatment: Open Open Open Wound Status: No No No Wound Recurrence: 0.5x1.2x0.1 4.7x2x0.1 0x0x0 Measurements [name] x W x D (cm) 0.471 7.383 0 A (cm) : rea 0.047 0.738 0 Volume (cm) : 36.90% 34.30% 100.00% % Reduction in A rea: 37.30% 34.30% 100.00% % Reduction in Volume: Grade 1 Grade 1 Grade 1 Classification: Medium Medium None Present Exudate A mount: Serosanguineous Serosanguineous N/A Exudate Type: red, brown red, brown N/A Exudate Color: Distinct, outline attached Distinct, outline attached Distinct, outline attached Wound Margin: Large (67-100%) Large (67-100%) None Present (0%) Granulation A mount: Red Red, Pink N/A Granulation Quality: Small (1-33%) Small (1-33%) None Present (0%) Necrotic A mount: Fat Layer (Subcutaneous Tissue): Yes Fat Layer (Subcutaneous Tissue): Yes Fascia: No Exposed Structures: Fascia: No Fascia: No Fat Layer (Subcutaneous Tissue): No Tendon: No Tendon: No Tendon: No Muscle: No Muscle: No Muscle: No Joint: No Joint: No Joint: No Bone: No Bone: No Bone: No Small (1-33%) Small (1-33%) Large (67-100%) Epithelialization: Debridement - Selective/Open Wound Debridement - Selective/Open Wound N/A Debridement: Pre-procedure Verification/Time Out 14:50 14:50 N/A Taken: Lidocaine 4% Topical  Solution Lidocaine 4% Topical Solution N/A Pain Control: [facility] N/A Tissue Debrided: Non-Viable Tissue Non-Viable Tissue N/A Level: [name], [name] ([id number]) [phone].pdf Page 4 of 10 0.47 7.38 N/A Debridement A (sq cm): rea Curette Curette N/A Instrument: Minimum Minimum N/A Bleeding: Silver Nitrate Pressure N/A Hemostasis Achieved: 0 0 N/A Procedural Pain: 0 0 N/A Post Procedural Pain: Procedure was tolerated well Procedure was tolerated well N/A Debridement Treatment Response: 0.5x1.2x0.1 4.7x2x0.1 N/A Post Debridement Measurements [name] x W x D (cm) 0.047 0.738 N/A Post Debridement Volume: (cm) No Abnormalities Noted No Abnormalities Noted No Abnormalities Noted Periwound Skin Texture: Dry/Scaly: Yes No Abnormalities Noted No Abnormalities Noted Periwound Skin Moisture: No Abnormalities Noted Hemosiderin Staining: Yes No Abnormalities Noted Periwound Skin Color: Rubor: Yes No Abnormality No Abnormality No Abnormality Temperature: Debridement Compression Therapy N/A Procedures Performed: Debridement Treatment Notes Electronic Signature(s) Signed: 07/09/2023 2:56:47 PM By: Zachary Guess MD FACS Entered By: [name] on 07/09/2023 14:56:47 -------------------------------------------------------------------------------- Multi-Disciplinary Care Plan Details Patient Name: Date of Service: [name] [name]. 07/09/2023 2:45 PM Medical Record Number: [id number] Patient Account Number: [id number] Date of Birth/Sex: Treating RN: Apr 06, 1956 (67 y.o. [name] Primary Care [name]: [name],  14:39:04 -------------------------------------------------------------------------------- Multi Wound Chart Details Patient Name: Date of Service: [name] [name]. 07/09/2023 2:45 PM Medical Record Number: [id number] Patient Account Number: [id number] Date of Birth/Sex: Treating RN: 1956-07-30 (67 y.o. M) Primary Care [name]: [name] Other Clinician: Referring [name]: Treating [name]/Extender: [name] in Treatment: 1 Vital Signs Height(in): Pulse(bpm): 78 Weight(lbs): Blood Pressure(mmHg): 112/73 Body Mass Index(BMI): Temperature(F): 97.8 Respiratory Rate(breaths/min): 20 [4:Photos:] Right Knee Left, Distal, Medial Lower Leg Left, Proximal, Medial Lower Leg Wound Location: Trauma Trauma Trauma Wounding Event: Diabetic Wound/Ulcer of the Lower Diabetic Wound/Ulcer of the Lower Diabetic Wound/Ulcer of the Lower Primary Etiology: Extremity Extremity Extremity Cataracts, Glaucoma, Anemia, Cataracts, Glaucoma, Anemia, Cataracts, Glaucoma, Anemia, Comorbid History: Asthma, Chronic Obstructive Asthma, Chronic Obstructive Asthma, Chronic  Obstructive Pulmonary Disease (COPD), Sleep Pulmonary Disease (COPD), Sleep Pulmonary Disease (COPD), Sleep Apnea, Arrhythmia, Congestive Heart Apnea, Arrhythmia, Congestive Heart Apnea, Arrhythmia, Congestive Heart Failure, Deep Vein Thrombosis, Failure, Deep Vein Thrombosis, Failure, Deep Vein Thrombosis, Myocardial Infarction, Peripheral Myocardial Infarction, Peripheral Myocardial Infarction, Peripheral Venous Disease, Type II Diabetes, Venous Disease, Type II Diabetes, Venous Disease, Type II Diabetes, Osteoarthritis, Neuropathy Osteoarthritis, Neuropathy Osteoarthritis, Neuropathy 05/14/2023 05/14/2023 05/14/2023 Date Acquired: 1 1 1  Weeks of Treatment: Open Open Open Wound Status: No No No Wound Recurrence: 0.5x1.2x0.1 4.7x2x0.1 0x0x0 Measurements [name] x W x D (cm) 0.471 7.383 0 A (cm) : rea 0.047 0.738 0 Volume (cm) : 36.90% 34.30% 100.00% % Reduction in A rea: 37.30% 34.30% 100.00% % Reduction in Volume: Grade 1 Grade 1 Grade 1 Classification: Medium Medium None Present Exudate A mount: Serosanguineous Serosanguineous N/A Exudate Type: red, brown red, brown N/A Exudate Color: Distinct, outline attached Distinct, outline attached Distinct, outline attached Wound Margin: Large (67-100%) Large (67-100%) None Present (0%) Granulation A mount: Red Red, Pink N/A Granulation Quality: Small (1-33%) Small (1-33%) None Present (0%) Necrotic A mount: Fat Layer (Subcutaneous Tissue): Yes Fat Layer (Subcutaneous Tissue): Yes Fascia: No Exposed Structures: Fascia: No Fascia: No Fat Layer (Subcutaneous Tissue): No Tendon: No Tendon: No Tendon: No Muscle: No Muscle: No Muscle: No Joint: No Joint: No Joint: No Bone: No Bone: No Bone: No Small (1-33%) Small (1-33%) Large (67-100%) Epithelialization: Debridement - Selective/Open Wound Debridement - Selective/Open Wound N/A Debridement: Pre-procedure Verification/Time Out 14:50 14:50 N/A Taken: Lidocaine 4% Topical  Solution Lidocaine 4% Topical Solution N/A Pain Control: [facility] N/A Tissue Debrided: Non-Viable Tissue Non-Viable Tissue N/A Level: [name], [name] ([id number]) [phone].pdf Page 4 of 10 0.47 7.38 N/A Debridement A (sq cm): rea Curette Curette N/A Instrument: Minimum Minimum N/A Bleeding: Silver Nitrate Pressure N/A Hemostasis Achieved: 0 0 N/A Procedural Pain: 0 0 N/A Post Procedural Pain: Procedure was tolerated well Procedure was tolerated well N/A Debridement Treatment Response: 0.5x1.2x0.1 4.7x2x0.1 N/A Post Debridement Measurements [name] x W x D (cm) 0.047 0.738 N/A Post Debridement Volume: (cm) No Abnormalities Noted No Abnormalities Noted No Abnormalities Noted Periwound Skin Texture: Dry/Scaly: Yes No Abnormalities Noted No Abnormalities Noted Periwound Skin Moisture: No Abnormalities Noted Hemosiderin Staining: Yes No Abnormalities Noted Periwound Skin Color: Rubor: Yes No Abnormality No Abnormality No Abnormality Temperature: Debridement Compression Therapy N/A Procedures Performed: Debridement Treatment Notes Electronic Signature(s) Signed: 07/09/2023 2:56:47 PM By: Zachary Guess MD FACS Entered By: [name] on 07/09/2023 14:56:47 -------------------------------------------------------------------------------- Multi-Disciplinary Care Plan Details Patient Name: Date of Service: [name] [name]. 07/09/2023 2:45 PM Medical Record Number: [id number] Patient Account Number: [id number] Date of Birth/Sex: Treating RN: Apr 06, 1956 (67 y.o. [name] Primary Care [name]: [name],

## 2023-07-09 NOTE — Progress Notes (Unsigned)
EPIC Encounter for ICM Monitoring  Patient Name: [name] is a 67 y.o. male Date: 07/09/2023 Primary Care Physican: [name], MD Primary Cardiologist: Shirlee Latch Electrophysiologist: Joycelyn Schmid Pacing: 99.8%    07/09/2023 Weight: 280 lbs     Spoke with patient and heart failure questions reviewed.  Transmission results reviewed.  Pt asymptomatic for fluid accumulation.  Reports feeling well at this time and voices no complaints.  He fell a couple of weeks ago and has fracture of C3, C4 and C7.  Being followed by neurologist.    Oleta Mouse thoracic impedance suggesting possible fluid accumulation from 8/27.   Prescribed:  Torsemide 20 mg 4 tablet(s) (80 mg total) by mouth twice day.    Confirmed 9/24 that he is taking as prescribed Potassium 20 mEq take 2 tablets (40 mEq total) daily   Labs: 07/07/2023 BMET scheduled with Dr Raphael Gibney office to recheck potassium  07/01/2023 Creatinine 1.09, BUN 20, Potassium 5.7, Sodium 142, GFR 69.72 (Potassium addressed by PCP, held K+ x 5 days and restarted at 40 mEq daily) 06/19/2023 Creatinine 1.38, BUN 21, Potassium 5.0, Sodium 134 05/15/2023 Creatinine 0.99, BUN 29, Potassium 3.8, Sodium 138, GFR >60 05/14/2023 Creatinine 1.23, BUN 37, Potassium 4.2, Sodium 138, GFR >60 03/22/2023 Creatinine 1.16, BUN 20, Potassium 4.0, Sodium 138, GFR >60 02/18/2023 Creatinine 1.20, BUN 23, Potassium 4.4, Sodium 135, GFR >60 01/25/2023 Creatinine 1.22, BUN 37, Potassium 3.6, Sodium 138, GFR >60 01/15/2023 Creatinine 1.38, BUN 22, Potassium 4.6, Sodium 134, GFR 52.70  12/31/2022 Creatinine 1.27, BUN 36, Potassium 5.2, Sodium 136, GFR 58.24  12/18/2022 Creatinine 1.13, BUN 36, Potassium 4.5, Sodium 135  11/24/2022 Creatinine 1.45, BUN 45, Potassium 3.2, Sodium 135, GFR 53 (12:09 PM) 11/24/2022 Creatinine 1.59, BUN 51, Potassium 4.0, Sodium 138, GFR 47 (6:01 AM) A complete set of results can be found in Results Review.   Recommendations:   Copy sent to Dr Shirlee Latch  for review and recommendations if needed.     Follow-up plan: ICM clinic phone appointment on 07/24/2023 to recheck fluid levels.   91 day device clinic remote transmission 07/24/2023.   EP/Cardiology Office Visits:    Recall 11/28/2023 with Dr Graciela Husbands.  09/04/2023 with Dr Shirlee Latch.   Copy of ICM check sent to Dr. Graciela Husbands.    3 month ICM trend: 07/09/2023.    12-14 Month ICM trend:     Karie Soda, RN 07/09/2023 4:01 PM

## 2023-07-09 NOTE — Progress Notes (Signed)
[name] ([id number]) [id number] Page 1 of 12 Visit Report for 07/09/2023 Chief Complaint Document Details Patient Name: Date of Service: [name] RD L. 07/09/2023 2:45 PM Medical Record Number: [id number] Patient Account Number: [id number] Date of Birth/Sex: Treating RN: 1955/11/06 (67 y.o. M) Primary Care Provider: [name] Other Clinician: Referring Provider: Treating Provider/Extender: Joeseph Amor in Treatment: 1 Information Obtained from: Patient Chief Complaint 05/24/2022; right lower extremity wound 07/02/2023: returns with BLE wounds Electronic Signature(s) Signed: 07/09/2023 2:57:23 PM By: [name] FACS Entered By: Zachary Guess on 07/09/2023 14:57:23 -------------------------------------------------------------------------------- Debridement Details Patient Name: Date of Service: [name] RD L. 07/09/2023 2:45 PM Medical Record Number: [id number] Patient Account Number: [id number] Date of Birth/Sex: Treating RN: 03-Jan-1956 (67 y.o. [name] Primary Care Provider: [name] Other Clinician: Referring Provider: Treating Provider/Extender: Joeseph Amor in Treatment: 1 Debridement Performed for Assessment: Wound #5 Left,Distal,Medial Lower Leg Performed By: Physician Zachary Guess, [name] The following information was scribed by: [name] The information was scribed for: Zachary Guess Debridement Type: Debridement Severity of Tissue Pre Debridement: Fat layer exposed Level of Consciousness (Pre-procedure): Awake and Alert Pre-procedure Verification/Time Out Yes - 14:50 Taken: Start Time: 14:50 Pain Control: Lidocaine 4% T opical Solution Percent of Wound Bed Debrided: 100% T Area Debrided (cm): otal 7.38 Tissue and other material debrided: Non-Viable, Slough, Slough Level: Non-Viable Tissue Debridement Description: Selective/Open Wound Instrument:  Curette Bleeding: Minimum Hemostasis Achieved: Pressure Procedural Pain: 0 Post Procedural Pain: 0 Response to Treatment: Procedure was tolerated well Level of Consciousness (Post- Awake and Alert procedure): Post Debridement Measurements of Total Wound Length: (cm) 4.7 Width: (cm) 2 Depth: (cm) 0.1 AOI, FREHNER ([id number]) [phone].pdf Page 2 of 12 Volume: (cm) 0.738 Character of Wound/Ulcer Post Debridement: Improved Severity of Tissue Post Debridement: Fat layer exposed Post Procedure Diagnosis Same as Pre-procedure Electronic Signature(s) Signed: 07/09/2023 3:03:44 PM By: [name] FACS Signed: 07/09/2023 3:46:08 PM By: Zachary Deed RN, BSN Entered By: [name] on 07/09/2023 14:53:30 -------------------------------------------------------------------------------- Debridement Details Patient Name: Date of Service: [name] RD L. 07/09/2023 2:45 PM Medical Record Number: [id number] Patient Account Number: [id number] Date of Birth/Sex: Treating RN: 1956-04-25 (67 y.o. [name] Primary Care Provider: [name] Other Clinician: Referring Provider: Treating Provider/Extender: Joeseph Amor in Treatment: 1 Debridement Performed for Assessment: Wound #4 Right Knee Performed By: Physician Zachary Guess, [name] The following information was scribed by: [name] The information was scribed for: Zachary Guess Debridement Type: Debridement Severity of Tissue Pre Debridement: Fat layer exposed Level of Consciousness (Pre-procedure): Awake and Alert Pre-procedure Verification/Time Out Yes - 14:50 Taken: Start Time: 14:50 Pain Control: Lidocaine 4% T opical Solution Percent of Wound Bed Debrided: 100% T Area Debrided (cm): otal 0.47 Tissue and other material debrided: Non-Viable, Slough, Slough Level: Non-Viable Tissue Debridement Description: Selective/Open Wound Instrument:  Curette Bleeding: Minimum Hemostasis Achieved: Silver Nitrate Procedural Pain: 0 Post Procedural Pain: 0 Response to Treatment: Procedure was tolerated well Level of Consciousness (Post- Awake and Alert procedure): Post Debridement Measurements of Total Wound Length: (cm) 0.5 Width: (cm) 1.2 Depth: (cm) 0.1 Volume: (cm) 0.047 Character of Wound/Ulcer Post Debridement: Improved Severity of Tissue Post Debridement: Fat layer exposed Post Procedure Diagnosis Same as Pre-procedure Electronic Signature(s) Signed: 07/09/2023 3:03:44 PM By: [name] FACS Signed: 07/09/2023 3:46:08 PM By: Zachary Deed RN, BSN Entered By: [name] on 07/09/2023 14:54:45 [place], [name] ([id number]) [id number] Page 3 of 12 --------------------------------------------------------------------------------  RN: 1956/04/03 (67 y.o. M) Primary Care Provider: [name] Other Clinician: Referring Provider: Treating Provider/Extender: Joeseph Amor in Treatment: 1 Diagnosis Coding ICD-10 Codes Code Description [phone] Non-pressure chronic ulcer of other part of left lower leg with fat layer exposed L97.812 Non-pressure chronic ulcer of other part of right lower leg with fat layer exposed E11.622 Type 2 diabetes mellitus with other skin ulcer I50.20 Unspecified systolic (congestive) heart failure Facility Procedures : 7 CPT4 Code: 8119147 Description: 97597 - DEBRIDE WOUND 1ST 20 SQ CM OR < ICD-10 Diagnosis Description L97.822 Non-pressure chronic ulcer of other part of left lower leg with fat layer expose L97.812 Non-pressure chronic ulcer of other part of right lower leg with fat layer  expos Modifier: d ed Quantity: 1 Physician Procedures : CPT4 Code Description Modifier [id number] - WC PHYS LEVEL 3 - EST PT [address] ([id number]) [phone] ICD-10 Diagnosis Description L97.822 Non-pressure chronic ulcer of other part of left lower leg with fat layer  exposed L97.812 Non-pressure chronic ulcer of other part of right lower leg with fat layer exposed E11.622 Type 2 diabetes mellitus with other skin ulcer I50.20 Unspecified systolic (congestive) heart failure Quantity: 1 .pdf Page 12 of 12 : [id number] 97597 - WC PHYS DEBR WO ANESTH 20 SQ CM 1 ICD-10 Diagnosis Description L97.822 Non-pressure chronic ulcer of other part of left lower leg with fat layer exposed L97.812 Non-pressure chronic ulcer of other part of right lower  leg with fat layer  exposed Quantity: Electronic Signature(s) Signed: 07/09/2023 3:03:29 PM By: [name] FACS Entered By: Zachary Guess on 07/09/2023 15:03:29  RN: 1956/04/03 (67 y.o. M) Primary Care Provider: [name] Other Clinician: Referring Provider: Treating Provider/Extender: Joeseph Amor in Treatment: 1 Diagnosis Coding ICD-10 Codes Code Description [phone] Non-pressure chronic ulcer of other part of left lower leg with fat layer exposed L97.812 Non-pressure chronic ulcer of other part of right lower leg with fat layer exposed E11.622 Type 2 diabetes mellitus with other skin ulcer I50.20 Unspecified systolic (congestive) heart failure Facility Procedures : 7 CPT4 Code: 8119147 Description: 97597 - DEBRIDE WOUND 1ST 20 SQ CM OR < ICD-10 Diagnosis Description L97.822 Non-pressure chronic ulcer of other part of left lower leg with fat layer expose L97.812 Non-pressure chronic ulcer of other part of right lower leg with fat layer  expos Modifier: d ed Quantity: 1 Physician Procedures : CPT4 Code Description Modifier [id number] - WC PHYS LEVEL 3 - EST PT [address] ([id number]) [phone] ICD-10 Diagnosis Description L97.822 Non-pressure chronic ulcer of other part of left lower leg with fat layer  exposed L97.812 Non-pressure chronic ulcer of other part of right lower leg with fat layer exposed E11.622 Type 2 diabetes mellitus with other skin ulcer I50.20 Unspecified systolic (congestive) heart failure Quantity: 1 .pdf Page 12 of 12 : [id number] 97597 - WC PHYS DEBR WO ANESTH 20 SQ CM 1 ICD-10 Diagnosis Description L97.822 Non-pressure chronic ulcer of other part of left lower leg with fat layer exposed L97.812 Non-pressure chronic ulcer of other part of right lower  leg with fat layer  exposed Quantity: Electronic Signature(s) Signed: 07/09/2023 3:03:29 PM By: [name] FACS Entered By: Zachary Guess on 07/09/2023 15:03:29  RN: 1956/04/03 (67 y.o. M) Primary Care Provider: [name] Other Clinician: Referring Provider: Treating Provider/Extender: Joeseph Amor in Treatment: 1 Diagnosis Coding ICD-10 Codes Code Description [phone] Non-pressure chronic ulcer of other part of left lower leg with fat layer exposed L97.812 Non-pressure chronic ulcer of other part of right lower leg with fat layer exposed E11.622 Type 2 diabetes mellitus with other skin ulcer I50.20 Unspecified systolic (congestive) heart failure Facility Procedures : 7 CPT4 Code: 8119147 Description: 97597 - DEBRIDE WOUND 1ST 20 SQ CM OR < ICD-10 Diagnosis Description L97.822 Non-pressure chronic ulcer of other part of left lower leg with fat layer expose L97.812 Non-pressure chronic ulcer of other part of right lower leg with fat layer  expos Modifier: d ed Quantity: 1 Physician Procedures : CPT4 Code Description Modifier [id number] - WC PHYS LEVEL 3 - EST PT [address] ([id number]) [phone] ICD-10 Diagnosis Description L97.822 Non-pressure chronic ulcer of other part of left lower leg with fat layer  exposed L97.812 Non-pressure chronic ulcer of other part of right lower leg with fat layer exposed E11.622 Type 2 diabetes mellitus with other skin ulcer I50.20 Unspecified systolic (congestive) heart failure Quantity: 1 .pdf Page 12 of 12 : [id number] 97597 - WC PHYS DEBR WO ANESTH 20 SQ CM 1 ICD-10 Diagnosis Description L97.822 Non-pressure chronic ulcer of other part of left lower leg with fat layer exposed L97.812 Non-pressure chronic ulcer of other part of right lower  leg with fat layer  exposed Quantity: Electronic Signature(s) Signed: 07/09/2023 3:03:29 PM By: [name] FACS Entered By: Zachary Guess on 07/09/2023 15:03:29  [name] ([id number]) [id number] Page 1 of 12 Visit Report for 07/09/2023 Chief Complaint Document Details Patient Name: Date of Service: [name] RD L. 07/09/2023 2:45 PM Medical Record Number: [id number] Patient Account Number: [id number] Date of Birth/Sex: Treating RN: 1955/11/06 (67 y.o. M) Primary Care Provider: [name] Other Clinician: Referring Provider: Treating Provider/Extender: Joeseph Amor in Treatment: 1 Information Obtained from: Patient Chief Complaint 05/24/2022; right lower extremity wound 07/02/2023: returns with BLE wounds Electronic Signature(s) Signed: 07/09/2023 2:57:23 PM By: [name] FACS Entered By: Zachary Guess on 07/09/2023 14:57:23 -------------------------------------------------------------------------------- Debridement Details Patient Name: Date of Service: [name] RD L. 07/09/2023 2:45 PM Medical Record Number: [id number] Patient Account Number: [id number] Date of Birth/Sex: Treating RN: 03-Jan-1956 (67 y.o. [name] Primary Care Provider: [name] Other Clinician: Referring Provider: Treating Provider/Extender: Joeseph Amor in Treatment: 1 Debridement Performed for Assessment: Wound #5 Left,Distal,Medial Lower Leg Performed By: Physician Zachary Guess, [name] The following information was scribed by: [name] The information was scribed for: Zachary Guess Debridement Type: Debridement Severity of Tissue Pre Debridement: Fat layer exposed Level of Consciousness (Pre-procedure): Awake and Alert Pre-procedure Verification/Time Out Yes - 14:50 Taken: Start Time: 14:50 Pain Control: Lidocaine 4% T opical Solution Percent of Wound Bed Debrided: 100% T Area Debrided (cm): otal 7.38 Tissue and other material debrided: Non-Viable, Slough, Slough Level: Non-Viable Tissue Debridement Description: Selective/Open Wound Instrument:  Curette Bleeding: Minimum Hemostasis Achieved: Pressure Procedural Pain: 0 Post Procedural Pain: 0 Response to Treatment: Procedure was tolerated well Level of Consciousness (Post- Awake and Alert procedure): Post Debridement Measurements of Total Wound Length: (cm) 4.7 Width: (cm) 2 Depth: (cm) 0.1 AOI, FREHNER ([id number]) [phone].pdf Page 2 of 12 Volume: (cm) 0.738 Character of Wound/Ulcer Post Debridement: Improved Severity of Tissue Post Debridement: Fat layer exposed Post Procedure Diagnosis Same as Pre-procedure Electronic Signature(s) Signed: 07/09/2023 3:03:44 PM By: [name] FACS Signed: 07/09/2023 3:46:08 PM By: Zachary Deed RN, BSN Entered By: [name] on 07/09/2023 14:53:30 -------------------------------------------------------------------------------- Debridement Details Patient Name: Date of Service: [name] RD L. 07/09/2023 2:45 PM Medical Record Number: [id number] Patient Account Number: [id number] Date of Birth/Sex: Treating RN: 1956-04-25 (67 y.o. [name] Primary Care Provider: [name] Other Clinician: Referring Provider: Treating Provider/Extender: Joeseph Amor in Treatment: 1 Debridement Performed for Assessment: Wound #4 Right Knee Performed By: Physician Zachary Guess, [name] The following information was scribed by: [name] The information was scribed for: Zachary Guess Debridement Type: Debridement Severity of Tissue Pre Debridement: Fat layer exposed Level of Consciousness (Pre-procedure): Awake and Alert Pre-procedure Verification/Time Out Yes - 14:50 Taken: Start Time: 14:50 Pain Control: Lidocaine 4% T opical Solution Percent of Wound Bed Debrided: 100% T Area Debrided (cm): otal 0.47 Tissue and other material debrided: Non-Viable, Slough, Slough Level: Non-Viable Tissue Debridement Description: Selective/Open Wound Instrument:  Curette Bleeding: Minimum Hemostasis Achieved: Silver Nitrate Procedural Pain: 0 Post Procedural Pain: 0 Response to Treatment: Procedure was tolerated well Level of Consciousness (Post- Awake and Alert procedure): Post Debridement Measurements of Total Wound Length: (cm) 0.5 Width: (cm) 1.2 Depth: (cm) 0.1 Volume: (cm) 0.047 Character of Wound/Ulcer Post Debridement: Improved Severity of Tissue Post Debridement: Fat layer exposed Post Procedure Diagnosis Same as Pre-procedure Electronic Signature(s) Signed: 07/09/2023 3:03:44 PM By: [name] FACS Signed: 07/09/2023 3:46:08 PM By: Zachary Deed RN, BSN Entered By: [name] on 07/09/2023 14:54:45 [place], [name] ([id number]) [id number] Page 3 of 12 --------------------------------------------------------------------------------  RN: 1956/04/03 (67 y.o. M) Primary Care Provider: [name] Other Clinician: Referring Provider: Treating Provider/Extender: Joeseph Amor in Treatment: 1 Diagnosis Coding ICD-10 Codes Code Description [phone] Non-pressure chronic ulcer of other part of left lower leg with fat layer exposed L97.812 Non-pressure chronic ulcer of other part of right lower leg with fat layer exposed E11.622 Type 2 diabetes mellitus with other skin ulcer I50.20 Unspecified systolic (congestive) heart failure Facility Procedures : 7 CPT4 Code: 8119147 Description: 97597 - DEBRIDE WOUND 1ST 20 SQ CM OR < ICD-10 Diagnosis Description L97.822 Non-pressure chronic ulcer of other part of left lower leg with fat layer expose L97.812 Non-pressure chronic ulcer of other part of right lower leg with fat layer  expos Modifier: d ed Quantity: 1 Physician Procedures : CPT4 Code Description Modifier [id number] - WC PHYS LEVEL 3 - EST PT [address] ([id number]) [phone] ICD-10 Diagnosis Description L97.822 Non-pressure chronic ulcer of other part of left lower leg with fat layer  exposed L97.812 Non-pressure chronic ulcer of other part of right lower leg with fat layer exposed E11.622 Type 2 diabetes mellitus with other skin ulcer I50.20 Unspecified systolic (congestive) heart failure Quantity: 1 .pdf Page 12 of 12 : [id number] 97597 - WC PHYS DEBR WO ANESTH 20 SQ CM 1 ICD-10 Diagnosis Description L97.822 Non-pressure chronic ulcer of other part of left lower leg with fat layer exposed L97.812 Non-pressure chronic ulcer of other part of right lower  leg with fat layer  exposed Quantity: Electronic Signature(s) Signed: 07/09/2023 3:03:29 PM By: [name] FACS Entered By: Zachary Guess on 07/09/2023 15:03:29  [name] ([id number]) [id number] Page 1 of 12 Visit Report for 07/09/2023 Chief Complaint Document Details Patient Name: Date of Service: [name] RD L. 07/09/2023 2:45 PM Medical Record Number: [id number] Patient Account Number: [id number] Date of Birth/Sex: Treating RN: 1955/11/06 (67 y.o. M) Primary Care Provider: [name] Other Clinician: Referring Provider: Treating Provider/Extender: Joeseph Amor in Treatment: 1 Information Obtained from: Patient Chief Complaint 05/24/2022; right lower extremity wound 07/02/2023: returns with BLE wounds Electronic Signature(s) Signed: 07/09/2023 2:57:23 PM By: [name] FACS Entered By: Zachary Guess on 07/09/2023 14:57:23 -------------------------------------------------------------------------------- Debridement Details Patient Name: Date of Service: [name] RD L. 07/09/2023 2:45 PM Medical Record Number: [id number] Patient Account Number: [id number] Date of Birth/Sex: Treating RN: 03-Jan-1956 (67 y.o. [name] Primary Care Provider: [name] Other Clinician: Referring Provider: Treating Provider/Extender: Joeseph Amor in Treatment: 1 Debridement Performed for Assessment: Wound #5 Left,Distal,Medial Lower Leg Performed By: Physician Zachary Guess, [name] The following information was scribed by: [name] The information was scribed for: Zachary Guess Debridement Type: Debridement Severity of Tissue Pre Debridement: Fat layer exposed Level of Consciousness (Pre-procedure): Awake and Alert Pre-procedure Verification/Time Out Yes - 14:50 Taken: Start Time: 14:50 Pain Control: Lidocaine 4% T opical Solution Percent of Wound Bed Debrided: 100% T Area Debrided (cm): otal 7.38 Tissue and other material debrided: Non-Viable, Slough, Slough Level: Non-Viable Tissue Debridement Description: Selective/Open Wound Instrument:  Curette Bleeding: Minimum Hemostasis Achieved: Pressure Procedural Pain: 0 Post Procedural Pain: 0 Response to Treatment: Procedure was tolerated well Level of Consciousness (Post- Awake and Alert procedure): Post Debridement Measurements of Total Wound Length: (cm) 4.7 Width: (cm) 2 Depth: (cm) 0.1 AOI, FREHNER ([id number]) [phone].pdf Page 2 of 12 Volume: (cm) 0.738 Character of Wound/Ulcer Post Debridement: Improved Severity of Tissue Post Debridement: Fat layer exposed Post Procedure Diagnosis Same as Pre-procedure Electronic Signature(s) Signed: 07/09/2023 3:03:44 PM By: [name] FACS Signed: 07/09/2023 3:46:08 PM By: Zachary Deed RN, BSN Entered By: [name] on 07/09/2023 14:53:30 -------------------------------------------------------------------------------- Debridement Details Patient Name: Date of Service: [name] RD L. 07/09/2023 2:45 PM Medical Record Number: [id number] Patient Account Number: [id number] Date of Birth/Sex: Treating RN: 1956-04-25 (67 y.o. [name] Primary Care Provider: [name] Other Clinician: Referring Provider: Treating Provider/Extender: Joeseph Amor in Treatment: 1 Debridement Performed for Assessment: Wound #4 Right Knee Performed By: Physician Zachary Guess, [name] The following information was scribed by: [name] The information was scribed for: Zachary Guess Debridement Type: Debridement Severity of Tissue Pre Debridement: Fat layer exposed Level of Consciousness (Pre-procedure): Awake and Alert Pre-procedure Verification/Time Out Yes - 14:50 Taken: Start Time: 14:50 Pain Control: Lidocaine 4% T opical Solution Percent of Wound Bed Debrided: 100% T Area Debrided (cm): otal 0.47 Tissue and other material debrided: Non-Viable, Slough, Slough Level: Non-Viable Tissue Debridement Description: Selective/Open Wound Instrument:  Curette Bleeding: Minimum Hemostasis Achieved: Silver Nitrate Procedural Pain: 0 Post Procedural Pain: 0 Response to Treatment: Procedure was tolerated well Level of Consciousness (Post- Awake and Alert procedure): Post Debridement Measurements of Total Wound Length: (cm) 0.5 Width: (cm) 1.2 Depth: (cm) 0.1 Volume: (cm) 0.047 Character of Wound/Ulcer Post Debridement: Improved Severity of Tissue Post Debridement: Fat layer exposed Post Procedure Diagnosis Same as Pre-procedure Electronic Signature(s) Signed: 07/09/2023 3:03:44 PM By: [name] FACS Signed: 07/09/2023 3:46:08 PM By: Zachary Deed RN, BSN Entered By: [name] on 07/09/2023 14:54:45 [place], [name] ([id number]) [id number] Page 3 of 12 --------------------------------------------------------------------------------  RN: 1956/04/03 (67 y.o. M) Primary Care Provider: [name] Other Clinician: Referring Provider: Treating Provider/Extender: Joeseph Amor in Treatment: 1 Diagnosis Coding ICD-10 Codes Code Description [phone] Non-pressure chronic ulcer of other part of left lower leg with fat layer exposed L97.812 Non-pressure chronic ulcer of other part of right lower leg with fat layer exposed E11.622 Type 2 diabetes mellitus with other skin ulcer I50.20 Unspecified systolic (congestive) heart failure Facility Procedures : 7 CPT4 Code: 8119147 Description: 97597 - DEBRIDE WOUND 1ST 20 SQ CM OR < ICD-10 Diagnosis Description L97.822 Non-pressure chronic ulcer of other part of left lower leg with fat layer expose L97.812 Non-pressure chronic ulcer of other part of right lower leg with fat layer  expos Modifier: d ed Quantity: 1 Physician Procedures : CPT4 Code Description Modifier [id number] - WC PHYS LEVEL 3 - EST PT [address] ([id number]) [phone] ICD-10 Diagnosis Description L97.822 Non-pressure chronic ulcer of other part of left lower leg with fat layer  exposed L97.812 Non-pressure chronic ulcer of other part of right lower leg with fat layer exposed E11.622 Type 2 diabetes mellitus with other skin ulcer I50.20 Unspecified systolic (congestive) heart failure Quantity: 1 .pdf Page 12 of 12 : [id number] 97597 - WC PHYS DEBR WO ANESTH 20 SQ CM 1 ICD-10 Diagnosis Description L97.822 Non-pressure chronic ulcer of other part of left lower leg with fat layer exposed L97.812 Non-pressure chronic ulcer of other part of right lower  leg with fat layer  exposed Quantity: Electronic Signature(s) Signed: 07/09/2023 3:03:29 PM By: [name] FACS Entered By: Zachary Guess on 07/09/2023 15:03:29  RN: 1956/04/03 (67 y.o. M) Primary Care Provider: [name] Other Clinician: Referring Provider: Treating Provider/Extender: Joeseph Amor in Treatment: 1 Diagnosis Coding ICD-10 Codes Code Description [phone] Non-pressure chronic ulcer of other part of left lower leg with fat layer exposed L97.812 Non-pressure chronic ulcer of other part of right lower leg with fat layer exposed E11.622 Type 2 diabetes mellitus with other skin ulcer I50.20 Unspecified systolic (congestive) heart failure Facility Procedures : 7 CPT4 Code: 8119147 Description: 97597 - DEBRIDE WOUND 1ST 20 SQ CM OR < ICD-10 Diagnosis Description L97.822 Non-pressure chronic ulcer of other part of left lower leg with fat layer expose L97.812 Non-pressure chronic ulcer of other part of right lower leg with fat layer  expos Modifier: d ed Quantity: 1 Physician Procedures : CPT4 Code Description Modifier [id number] - WC PHYS LEVEL 3 - EST PT [address] ([id number]) [phone] ICD-10 Diagnosis Description L97.822 Non-pressure chronic ulcer of other part of left lower leg with fat layer  exposed L97.812 Non-pressure chronic ulcer of other part of right lower leg with fat layer exposed E11.622 Type 2 diabetes mellitus with other skin ulcer I50.20 Unspecified systolic (congestive) heart failure Quantity: 1 .pdf Page 12 of 12 : [id number] 97597 - WC PHYS DEBR WO ANESTH 20 SQ CM 1 ICD-10 Diagnosis Description L97.822 Non-pressure chronic ulcer of other part of left lower leg with fat layer exposed L97.812 Non-pressure chronic ulcer of other part of right lower  leg with fat layer  exposed Quantity: Electronic Signature(s) Signed: 07/09/2023 3:03:29 PM By: [name] FACS Entered By: Zachary Guess on 07/09/2023 15:03:29  RN: 1956/04/03 (67 y.o. M) Primary Care Provider: [name] Other Clinician: Referring Provider: Treating Provider/Extender: Joeseph Amor in Treatment: 1 Diagnosis Coding ICD-10 Codes Code Description [phone] Non-pressure chronic ulcer of other part of left lower leg with fat layer exposed L97.812 Non-pressure chronic ulcer of other part of right lower leg with fat layer exposed E11.622 Type 2 diabetes mellitus with other skin ulcer I50.20 Unspecified systolic (congestive) heart failure Facility Procedures : 7 CPT4 Code: [id number] Description: 97597 - DEBRIDE WOUND 1ST 20 SQ CM OR < ICD-10 Diagnosis Description L97.822 Non-pressure chronic ulcer of other part of left lower leg with fat layer expose L97.812 Non-pressure chronic ulcer of other part of right lower leg with fat layer  expos Modifier: d ed Quantity: 1 Physician Procedures : CPT4 Code Description Modifier [id number] - WC PHYS LEVEL 3 - EST PT [address] ([id number]) [phone] ICD-10 Diagnosis Description L97.822 Non-pressure chronic ulcer of other part of left lower leg with fat layer  exposed L97.812 Non-pressure chronic ulcer of other part of right lower leg with fat layer exposed E11.622 Type 2 diabetes mellitus with other skin ulcer I50.20 Unspecified systolic (congestive) heart failure Quantity: 1 .pdf Page 12 of 12 : [id number] 97597 - WC PHYS DEBR WO ANESTH 20 SQ CM 1 ICD-10 Diagnosis Description L97.822 Non-pressure chronic ulcer of other part of left lower leg with fat layer exposed L97.812 Non-pressure chronic ulcer of other part of right lower  leg with fat layer  exposed Quantity: Electronic Signature(s) Signed: 07/09/2023 3:03:29 PM By: [name] FACS Entered By: Zachary Guess on 07/09/2023 15:03:29

## 2023-07-10 NOTE — Progress Notes (Unsigned)
Attempted call to patient and unable to reach.  Left message to return call.  

## 2023-07-10 NOTE — Progress Notes (Signed)
Take torsemide 100 mg bid x 3 days then back to 80 mg bid.

## 2023-07-11 NOTE — Progress Notes (Signed)
Spoke with patient and advised Dr Shirlee Latch recommended he take Torsemide 100 mg twice a day x 3 days only.  After 3rd day return to 80 mg twice a day.  Repeated instructions several times and he verbalized understanding. Advised will be out of the office next week and call HF clinic directly if he needs assistance with fluid symptoms or changes in condition.

## 2023-07-11 NOTE — Progress Notes (Signed)
Attempted call to patient and unable to reach.  Left message to return call.  

## 2023-07-11 NOTE — Progress Notes (Signed)
Attempted call to patient and unable to reach.  Left message to return call.

## 2023-07-15 ENCOUNTER — Telehealth (INDEPENDENT_AMBULATORY_CARE_PROVIDER_SITE_OTHER): Payer: Medicare HMO | Admitting: Pulmonary Disease

## 2023-07-15 DIAGNOSIS — G4733 Obstructive sleep apnea (adult) (pediatric): Secondary | ICD-10-CM

## 2023-07-15 NOTE — Telephone Encounter (Signed)
HST showed mild  OSA with AHI 11/ hr with low saturation of 66% Pattern of desaturation suggest underlying cardiopulmonary disease with sustained desaturation and Nadir of 66% suggesting effect of REM sleep

## 2023-07-17 ENCOUNTER — Other Ambulatory Visit: Payer: Self-pay

## 2023-07-17 DIAGNOSIS — Z8709 Personal history of other diseases of the respiratory system: Secondary | ICD-10-CM

## 2023-07-17 DIAGNOSIS — J3089 Other allergic rhinitis: Secondary | ICD-10-CM

## 2023-07-17 MED ORDER — FLUTICASONE PROPIONATE 50 MCG/ACT NA SUSP: 2.0000 | Freq: Every day | NASAL | 1 refills | Status: AC | PRN

## 2023-07-17 MED ORDER — KETOCONAZOLE 2 % EX CREA: 1.0000 | TOPICAL_CREAM | Freq: Every day | CUTANEOUS | 0 refills | Status: DC

## 2023-07-17 MED ORDER — PROMETHAZINE HCL 25 MG PO TABS: 25.0000 mg | ORAL_TABLET | Freq: Three times a day (TID) | ORAL | 0 refills | Status: DC | PRN

## 2023-07-17 MED ORDER — FLUCONAZOLE 100 MG PO TABS: 100.0000 mg | ORAL_TABLET | Freq: Every day | ORAL | 0 refills | Status: DC

## 2023-07-17 NOTE — Telephone Encounter (Signed)
Please contact patient regarding sleep test study results as noted by Dr. Vassie Loll on 07/15/23.  Please offer patient CPAP and if he agrees, place orders for auto CPAP 5-20 cm H20 with supplies. He would be a new start.

## 2023-07-18 ENCOUNTER — Encounter (HOSPITAL_BASED_OUTPATIENT_CLINIC_OR_DEPARTMENT_OTHER): Payer: Medicare HMO | Attending: General Surgery | Admitting: General Surgery

## 2023-07-18 DIAGNOSIS — E11622 Type 2 diabetes mellitus with other skin ulcer: Secondary | ICD-10-CM | POA: Diagnosis not present

## 2023-07-18 DIAGNOSIS — L97812 Non-pressure chronic ulcer of other part of right lower leg with fat layer exposed: Secondary | ICD-10-CM | POA: Diagnosis not present

## 2023-07-18 DIAGNOSIS — E1151 Type 2 diabetes mellitus with diabetic peripheral angiopathy without gangrene: Secondary | ICD-10-CM | POA: Diagnosis not present

## 2023-07-18 DIAGNOSIS — E1140 Type 2 diabetes mellitus with diabetic neuropathy, unspecified: Secondary | ICD-10-CM | POA: Diagnosis not present

## 2023-07-18 DIAGNOSIS — Z86718 Personal history of other venous thrombosis and embolism: Secondary | ICD-10-CM | POA: Diagnosis not present

## 2023-07-18 DIAGNOSIS — I4891 Unspecified atrial fibrillation: Secondary | ICD-10-CM | POA: Insufficient documentation

## 2023-07-18 DIAGNOSIS — I890 Lymphedema, not elsewhere classified: Secondary | ICD-10-CM | POA: Insufficient documentation

## 2023-07-18 DIAGNOSIS — I872 Venous insufficiency (chronic) (peripheral): Secondary | ICD-10-CM | POA: Diagnosis not present

## 2023-07-18 DIAGNOSIS — L97822 Non-pressure chronic ulcer of other part of left lower leg with fat layer exposed: Secondary | ICD-10-CM | POA: Insufficient documentation

## 2023-07-18 DIAGNOSIS — M797 Fibromyalgia: Secondary | ICD-10-CM | POA: Insufficient documentation

## 2023-07-18 DIAGNOSIS — G4730 Sleep apnea, unspecified: Secondary | ICD-10-CM | POA: Diagnosis not present

## 2023-07-18 DIAGNOSIS — I5022 Chronic systolic (congestive) heart failure: Secondary | ICD-10-CM | POA: Diagnosis not present

## 2023-07-18 DIAGNOSIS — I428 Other cardiomyopathies: Secondary | ICD-10-CM | POA: Diagnosis not present

## 2023-07-18 DIAGNOSIS — D649 Anemia, unspecified: Secondary | ICD-10-CM | POA: Insufficient documentation

## 2023-07-18 DIAGNOSIS — H409 Unspecified glaucoma: Secondary | ICD-10-CM | POA: Insufficient documentation

## 2023-07-18 NOTE — Progress Notes (Addendum)
JOHNTHOMAS, LADER ([id number]) [id number] Page 1 of 9 Visit Report for 07/18/2023 Arrival Information Details Patient Name: Date of Service: [name] 07/18/2023 2:00 PM Medical Record Number: [id number] Patient Account Number: [id number] Date of Birth/Sex: Treating RN: Apr 11, 1956 (67 y.o. M) Primary Care Arma Reining: [name] Other Clinician: Referring Anijah Spohr: Treating Ora Mcnatt/Extender: Joeseph Amor in Treatment: 2 Visit Information History Since Last Visit Added or deleted any medications: No Patient Arrived: Gilmer Mor Any new allergies or adverse reactions: No Arrival Time: 14:13 Had a fall or experienced change in No Accompanied By: wife activities of daily living that may affect Transfer Assistance: None risk of falls: Patient Identification Verified: Yes Signs or symptoms of abuse/neglect since last visito No Secondary Verification Process Completed: Yes Hospitalized since last visit: No Patient Has Alerts: Yes Implantable device outside of the clinic excluding No Patient Alerts: ABIs: both Ralls 10/22/2022 cellular tissue based products placed in the center TBIs: R: 0.92 L:1.16 1/24 since last visit: Has Dressing in Place as Prescribed: Yes Has Compression in Place as Prescribed: Yes Pain Present Now: No Electronic Signature(s) Signed: 07/18/2023 4:38:08 PM By: Zenaida Deed RN, BSN Previous Signature: 07/18/2023 2:19:12 PM Version By: Dayton Scrape Entered By: Zenaida Deed on 07/18/2023 11:37:04 -------------------------------------------------------------------------------- Compression Therapy Details Patient Name: Date of Service: [name] 07/18/2023 2:00 PM Medical Record Number: [id number] Patient Account Number: [id number] Date of Birth/Sex: Treating RN: [birth year] (67 y.o. [name] Primary Care Rori Goar: [name] Other Clinician: Referring Saylor Murry: Treating Jaqua Ching/Extender: Joeseph Amor in Treatment: 2 Compression Therapy Performed for Wound Assessment: Wound #5 Left,Distal,Medial Lower Leg Performed By: Clinician Zenaida Deed, RN Compression Type: Double Layer Post Procedure Diagnosis Same as Pre-procedure Notes urgo lite Electronic Signature(s) Signed: 07/18/2023 4:38:08 PM By: Zenaida Deed RN, BSN Entered By: Zenaida Deed on 07/18/2023 11:53:31 Daguao, [name] ([id number]) [id number] Page 2 of 9 -------------------------------------------------------------------------------- Encounter Discharge Information Details Patient Name: Date of Service: [name] 07/18/2023 2:00 PM Medical Record Number: [id number] Patient Account Number: [id number] Date of Birth/Sex: Treating RN: 1956/06/24 (67 y.o. [name] Primary Care Rhianne Soman: [name] Other Clinician: Referring Rajendra Spiller: Treating Monna Crean/Extender: Joeseph Amor in Treatment: 2 Encounter Discharge Information Items Post Procedure Vitals Discharge Condition: Stable Temperature (F): 97.8 Ambulatory Status: Cane Pulse (bpm): 70 Discharge Destination: Home Respiratory Rate (breaths/min): 18 Transportation: Private Auto Blood Pressure (mmHg): 95/67 Accompanied By: self Schedule Follow-up Appointment: Yes Clinical Summary of Care: Patient Declined Electronic Signature(s) Signed: 07/18/2023 4:38:08 PM By: Zenaida Deed RN, BSN Entered By: Zenaida Deed on 07/18/2023 12:11:43 -------------------------------------------------------------------------------- Lower Extremity Assessment Details Patient Name: Date of Service: [name] 07/18/2023 2:00 PM Medical Record Number: [id number] Patient Account Number: [id number] Date of Birth/Sex: Treating RN: [birth year] (67 y.o. [name] Primary Care Reeanna Acri: [name] Other Clinician: Referring Arion Morgan: Treating Lukisha Procida/Extender: Joeseph Amor in Treatment: 2 Edema Assessment Assessed: [Left: No] [Right: No] [Left: Edema] [Right: :] Calf Left: Right: Point of Measurement: From Medial Instep 34 cm Ankle Left: Right: Point of Measurement: From Medial Instep 22 cm Vascular Assessment Pulses: Dorsalis Pedis Palpable: [Right:Yes] Extremity colors, hair growth, and conditions: Extremity Color: [Right:Hyperpigmented] Hair Growth on Extremity: [Right:No] Temperature of Extremity: [Right:Warm] Capillary Refill: [Right:< 3 seconds] Dependent Rubor: [Right:No No] Electronic Signature(s) Signed: 07/18/2023 4:38:08 PM By: Zenaida Deed RN, BSN Entered By: Zenaida Deed on 07/18/2023 11:38:49 [name] ([id number]) [id number] Page 3 of 9 --------------------------------------------------------------------------------  Yes Necrotic Quality: Eschar Tendon Exposed: No Muscle Exposed: No Joint Exposed: No Bone Exposed: No Periwound Skin Texture Texture Color No Abnormalities Noted: Yes No Abnormalities Noted: Yes Moisture Temperature / Pain No Abnormalities Noted: Yes Temperature: No Abnormality Treatment Notes Wound #4 (Knee) Wound Laterality: Right Cleanser Soap and Water Discharge Instruction: May shower and wash wound with dial antibacterial soap and water prior to dressing change. Vashe 5.8 (oz) Discharge Instruction: Cleanse the wound with Vashe prior to applying a clean dressing using gauze sponges, not tissue or cotton balls. Peri-Wound Care Topical Primary Dressing Hydrofera Blue Classic Foam, 2x2 in Discharge Instruction: Moisten with saline prior to applying to wound bed Secondary Dressing Bordered Gauze, 4x4 in Discharge Instruction: Apply over primary dressing as directed. Secured With Compression Wrap Compression Stockings Facilities manager) Signed: 07/18/2023 4:38:08 PM By: Zenaida Deed RN, BSN Previous Signature: 07/18/2023 2:19:12 PM Version By: Dayton Scrape Entered By: Zenaida Deed on 07/18/2023 11:48:01 -------------------------------------------------------------------------------- Wound  Assessment Details Patient Name: Date of Service: Richardo Priest, [name] RD L. 07/18/2023 2:00 PM Medical Record Number: [id number] Patient Account Number: [id number] Date of Birth/Sex: Treating RN: 20-Oct-1955 (67 y.o. [name] Primary Care Cammi Consalvo: [name] Other Clinician: GARLEN, REINIG ([id number]) [id number] Page 8 of 9 Referring Sukaina Toothaker: Treating Nevin Grizzle/Extender: Joeseph Amor in Treatment: 2 Wound Status Wound Number: 5 Primary Diabetic Wound/Ulcer of the Lower Extremity Etiology: Wound Location: Left, Distal, Medial Lower Leg Wound Open Wounding Event: Trauma Status: Date Acquired: 05/14/2023 Comorbid Cataracts, Glaucoma, Anemia, Asthma, Chronic Obstructive Weeks Of Treatment: 2 History: Pulmonary Disease (COPD), Sleep Apnea, Arrhythmia, Congestive Clustered Wound: No Heart Failure, Deep Vein Thrombosis, Myocardial Infarction, Peripheral Venous Disease, Type II Diabetes, Osteoarthritis, Neuropathy Photos Wound Measurements Length: (cm) 2.2 Width: (cm) 0.9 Depth: (cm) 0.1 Area: (cm) 1.555 Volume: (cm) 0.156 % Reduction in Area: 86.2% % Reduction in Volume: 86.1% Epithelialization: Medium (34-66%) Tunneling: No Undermining: No Wound Description Classification: Grade 1 Wound Margin: Flat and Intact Exudate Amount: Medium Exudate Type: Serosanguineous Exudate Color: red, brown Foul Odor After Cleansing: No Slough/Fibrino Yes Wound Bed Granulation Amount: Large (67-100%) Exposed Structure Granulation Quality: Red, Pink Fascia Exposed: No Necrotic Amount: Small (1-33%) Fat Layer (Subcutaneous Tissue) Exposed: Yes Necrotic Quality: Adherent Slough Tendon Exposed: No Muscle Exposed: No Joint Exposed: No Bone Exposed: No Periwound Skin Texture Texture Color No Abnormalities Noted: Yes No Abnormalities Noted: No Hemosiderin Staining: Yes Moisture Rubor: No No Abnormalities Noted: Yes Temperature /  Pain Temperature: No Abnormality Treatment Notes Wound #5 (Lower Leg) Wound Laterality: Left, Medial, Distal Cleanser Soap and Water Discharge Instruction: May shower and wash wound with dial antibacterial soap and water prior to dressing change. Vashe 5.8 (oz) Discharge Instruction: Cleanse the wound with Vashe prior to applying a clean dressing using gauze sponges, not tissue or cotton balls. Peri-Wound Care Sween Lotion (Moisturizing lotion) Discharge Instruction: Apply moisturizing lotion as directed SHUBHAM, THACKSTON ([id number]) [phone].pdf Page 9 of 9 Topical Primary Dressing Hydrofera Blue Classic Foam, 4x4 in Discharge Instruction: Moisten with saline prior to applying to wound bed Secondary Dressing Woven Gauze Sponge, Non-Sterile 4x4 in Discharge Instruction: Apply over primary dressing as directed. Secured With Compression Wrap Urgo K2 Lite, (equivalent to a 3 layer) two layer compression system, regular Discharge Instruction: Apply Urgo K2 Lite as directed (alternative to 3 layer compression). Compression Stockings Add-Ons Electronic Signature(s) Signed: 07/18/2023 4:38:08 PM By: Zenaida Deed RN, BSN Previous Signature: 07/18/2023 2:19:12 PM Version By: Dayton Scrape Entered By: Zenaida Deed on 07/18/2023  11:44:21 -------------------------------------------------------------------------------- Vitals Details Patient Name: Date of Service: [name] 07/18/2023 2:00 PM Medical Record Number: [id number] Patient Account Number: [id number] Date of Birth/Sex: Treating RN: [birth year] (67 y.o. M) Primary Care Tiegan Terpstra: [name] Other Clinician: Referring Kelcey Wickstrom: Treating Tarez Bowns/Extender: Joeseph Amor in Treatment: 2 Vital Signs Time Taken: 02:14 Temperature (F): 97.8 Height (in): 70 Pulse (bpm): 70 Weight (lbs): 280 Respiratory Rate (breaths/min): 20 Body Mass Index (BMI): 40.2 Blood Pressure (mmHg):  95/67 Reference Range: 80 - 120 mg / dl Electronic Signature(s) Signed: 07/18/2023 4:38:08 PM By: Zenaida Deed RN, BSN Previous Signature: 07/18/2023 2:19:12 PM Version By: Dayton Scrape Entered By: Zenaida Deed on 07/18/2023 11:37:16  Yes Necrotic Quality: Eschar Tendon Exposed: No Muscle Exposed: No Joint Exposed: No Bone Exposed: No Periwound Skin Texture Texture Color No Abnormalities Noted: Yes No Abnormalities Noted: Yes Moisture Temperature / Pain No Abnormalities Noted: Yes Temperature: No Abnormality Treatment Notes Wound #4 (Knee) Wound Laterality: Right Cleanser Soap and Water Discharge Instruction: May shower and wash wound with dial antibacterial soap and water prior to dressing change. Vashe 5.8 (oz) Discharge Instruction: Cleanse the wound with Vashe prior to applying a clean dressing using gauze sponges, not tissue or cotton balls. Peri-Wound Care Topical Primary Dressing Hydrofera Blue Classic Foam, 2x2 in Discharge Instruction: Moisten with saline prior to applying to wound bed Secondary Dressing Bordered Gauze, 4x4 in Discharge Instruction: Apply over primary dressing as directed. Secured With Compression Wrap Compression Stockings Facilities manager) Signed: 07/18/2023 4:38:08 PM By: Zenaida Deed RN, BSN Previous Signature: 07/18/2023 2:19:12 PM Version By: Dayton Scrape Entered By: Zenaida Deed on 07/18/2023 11:48:01 -------------------------------------------------------------------------------- Wound  Assessment Details Patient Name: Date of Service: Richardo Priest, [name] RD L. 07/18/2023 2:00 PM Medical Record Number: [id number] Patient Account Number: [id number] Date of Birth/Sex: Treating RN: 20-Oct-1955 (67 y.o. [name] Primary Care Cammi Consalvo: [name] Other Clinician: GARLEN, REINIG ([id number]) [id number] Page 8 of 9 Referring Sukaina Toothaker: Treating Nevin Grizzle/Extender: Joeseph Amor in Treatment: 2 Wound Status Wound Number: 5 Primary Diabetic Wound/Ulcer of the Lower Extremity Etiology: Wound Location: Left, Distal, Medial Lower Leg Wound Open Wounding Event: Trauma Status: Date Acquired: 05/14/2023 Comorbid Cataracts, Glaucoma, Anemia, Asthma, Chronic Obstructive Weeks Of Treatment: 2 History: Pulmonary Disease (COPD), Sleep Apnea, Arrhythmia, Congestive Clustered Wound: No Heart Failure, Deep Vein Thrombosis, Myocardial Infarction, Peripheral Venous Disease, Type II Diabetes, Osteoarthritis, Neuropathy Photos Wound Measurements Length: (cm) 2.2 Width: (cm) 0.9 Depth: (cm) 0.1 Area: (cm) 1.555 Volume: (cm) 0.156 % Reduction in Area: 86.2% % Reduction in Volume: 86.1% Epithelialization: Medium (34-66%) Tunneling: No Undermining: No Wound Description Classification: Grade 1 Wound Margin: Flat and Intact Exudate Amount: Medium Exudate Type: Serosanguineous Exudate Color: red, brown Foul Odor After Cleansing: No Slough/Fibrino Yes Wound Bed Granulation Amount: Large (67-100%) Exposed Structure Granulation Quality: Red, Pink Fascia Exposed: No Necrotic Amount: Small (1-33%) Fat Layer (Subcutaneous Tissue) Exposed: Yes Necrotic Quality: Adherent Slough Tendon Exposed: No Muscle Exposed: No Joint Exposed: No Bone Exposed: No Periwound Skin Texture Texture Color No Abnormalities Noted: Yes No Abnormalities Noted: No Hemosiderin Staining: Yes Moisture Rubor: No No Abnormalities Noted: Yes Temperature /  Pain Temperature: No Abnormality Treatment Notes Wound #5 (Lower Leg) Wound Laterality: Left, Medial, Distal Cleanser Soap and Water Discharge Instruction: May shower and wash wound with dial antibacterial soap and water prior to dressing change. Vashe 5.8 (oz) Discharge Instruction: Cleanse the wound with Vashe prior to applying a clean dressing using gauze sponges, not tissue or cotton balls. Peri-Wound Care Sween Lotion (Moisturizing lotion) Discharge Instruction: Apply moisturizing lotion as directed SHUBHAM, THACKSTON ([id number]) [phone].pdf Page 9 of 9 Topical Primary Dressing Hydrofera Blue Classic Foam, 4x4 in Discharge Instruction: Moisten with saline prior to applying to wound bed Secondary Dressing Woven Gauze Sponge, Non-Sterile 4x4 in Discharge Instruction: Apply over primary dressing as directed. Secured With Compression Wrap Urgo K2 Lite, (equivalent to a 3 layer) two layer compression system, regular Discharge Instruction: Apply Urgo K2 Lite as directed (alternative to 3 layer compression). Compression Stockings Add-Ons Electronic Signature(s) Signed: 07/18/2023 4:38:08 PM By: Zenaida Deed RN, BSN Previous Signature: 07/18/2023 2:19:12 PM Version By: Dayton Scrape Entered By: Zenaida Deed on 07/18/2023  Yes Necrotic Quality: Eschar Tendon Exposed: No Muscle Exposed: No Joint Exposed: No Bone Exposed: No Periwound Skin Texture Texture Color No Abnormalities Noted: Yes No Abnormalities Noted: Yes Moisture Temperature / Pain No Abnormalities Noted: Yes Temperature: No Abnormality Treatment Notes Wound #4 (Knee) Wound Laterality: Right Cleanser Soap and Water Discharge Instruction: May shower and wash wound with dial antibacterial soap and water prior to dressing change. Vashe 5.8 (oz) Discharge Instruction: Cleanse the wound with Vashe prior to applying a clean dressing using gauze sponges, not tissue or cotton balls. Peri-Wound Care Topical Primary Dressing Hydrofera Blue Classic Foam, 2x2 in Discharge Instruction: Moisten with saline prior to applying to wound bed Secondary Dressing Bordered Gauze, 4x4 in Discharge Instruction: Apply over primary dressing as directed. Secured With Compression Wrap Compression Stockings Facilities manager) Signed: 07/18/2023 4:38:08 PM By: Zenaida Deed RN, BSN Previous Signature: 07/18/2023 2:19:12 PM Version By: Dayton Scrape Entered By: Zenaida Deed on 07/18/2023 11:48:01 -------------------------------------------------------------------------------- Wound  Assessment Details Patient Name: Date of Service: Richardo Priest, [name] RD L. 07/18/2023 2:00 PM Medical Record Number: [id number] Patient Account Number: [id number] Date of Birth/Sex: Treating RN: 20-Oct-1955 (67 y.o. [name] Primary Care Cammi Consalvo: [name] Other Clinician: GARLEN, REINIG ([id number]) [id number] Page 8 of 9 Referring Sukaina Toothaker: Treating Nevin Grizzle/Extender: Joeseph Amor in Treatment: 2 Wound Status Wound Number: 5 Primary Diabetic Wound/Ulcer of the Lower Extremity Etiology: Wound Location: Left, Distal, Medial Lower Leg Wound Open Wounding Event: Trauma Status: Date Acquired: 05/14/2023 Comorbid Cataracts, Glaucoma, Anemia, Asthma, Chronic Obstructive Weeks Of Treatment: 2 History: Pulmonary Disease (COPD), Sleep Apnea, Arrhythmia, Congestive Clustered Wound: No Heart Failure, Deep Vein Thrombosis, Myocardial Infarction, Peripheral Venous Disease, Type II Diabetes, Osteoarthritis, Neuropathy Photos Wound Measurements Length: (cm) 2.2 Width: (cm) 0.9 Depth: (cm) 0.1 Area: (cm) 1.555 Volume: (cm) 0.156 % Reduction in Area: 86.2% % Reduction in Volume: 86.1% Epithelialization: Medium (34-66%) Tunneling: No Undermining: No Wound Description Classification: Grade 1 Wound Margin: Flat and Intact Exudate Amount: Medium Exudate Type: Serosanguineous Exudate Color: red, brown Foul Odor After Cleansing: No Slough/Fibrino Yes Wound Bed Granulation Amount: Large (67-100%) Exposed Structure Granulation Quality: Red, Pink Fascia Exposed: No Necrotic Amount: Small (1-33%) Fat Layer (Subcutaneous Tissue) Exposed: Yes Necrotic Quality: Adherent Slough Tendon Exposed: No Muscle Exposed: No Joint Exposed: No Bone Exposed: No Periwound Skin Texture Texture Color No Abnormalities Noted: Yes No Abnormalities Noted: No Hemosiderin Staining: Yes Moisture Rubor: No No Abnormalities Noted: Yes Temperature /  Pain Temperature: No Abnormality Treatment Notes Wound #5 (Lower Leg) Wound Laterality: Left, Medial, Distal Cleanser Soap and Water Discharge Instruction: May shower and wash wound with dial antibacterial soap and water prior to dressing change. Vashe 5.8 (oz) Discharge Instruction: Cleanse the wound with Vashe prior to applying a clean dressing using gauze sponges, not tissue or cotton balls. Peri-Wound Care Sween Lotion (Moisturizing lotion) Discharge Instruction: Apply moisturizing lotion as directed SHUBHAM, THACKSTON ([id number]) [phone].pdf Page 9 of 9 Topical Primary Dressing Hydrofera Blue Classic Foam, 4x4 in Discharge Instruction: Moisten with saline prior to applying to wound bed Secondary Dressing Woven Gauze Sponge, Non-Sterile 4x4 in Discharge Instruction: Apply over primary dressing as directed. Secured With Compression Wrap Urgo K2 Lite, (equivalent to a 3 layer) two layer compression system, regular Discharge Instruction: Apply Urgo K2 Lite as directed (alternative to 3 layer compression). Compression Stockings Add-Ons Electronic Signature(s) Signed: 07/18/2023 4:38:08 PM By: Zenaida Deed RN, BSN Previous Signature: 07/18/2023 2:19:12 PM Version By: Dayton Scrape Entered By: Zenaida Deed on 07/18/2023

## 2023-07-18 NOTE — Progress Notes (Signed)
failure 07/02/2023 No Yes Inactive Problems Resolved Problems Electronic Signature(s) Signed: 07/18/2023 3:04:11 PM By: Duanne Guess MD FACS Entered By: Duanne Guess on 07/18/2023 12:04:11 -------------------------------------------------------------------------------- Progress Note Details Patient Name: Date of Service: [name] RD L. 07/18/2023 2:00 PM Medical Record Number: [id number] Patient Account Number: [id number] Date of Birth/Sex: Treating RN: April 12, 1956 (67 y.o. M) Primary Care Provider: [name] Other Clinician: Referring Provider: Treating Provider/Extender: Joeseph Amor in Treatment: 2 Subjective Chief Complaint Information obtained from Patient 05/24/2022;  right lower extremity wound 07/02/2023: returns with BLE wounds History of Present Illness (HPI) Admission 5/2 [name] is a 67 year old male with a past medical history of systolic congestive heart failure, COPD, nonischemic cardiomyopathy, type 2 diabetes, chronic venous insufficiency that presents to the clinic for a 10-month history of nonhealing wound to his left lower extremity. He states that in November 2021 he fell off his porch and Hit his leg against a brick. The wound has healed mostly except for 1 area that is still open. He reports moderate Serosanguineous drainage daily. He has been keeping it covered with Band-Aids. He reports 2 rounds of antibiotics for this issue. He denies pain. He denies current acute signs of infection. 5/9; patient presents for 1 week follow-up. He has been using Hydrofera Blue under 3 layer compression. He has no issues or complaints today. He denies signs of infection. 5/16; patient presents for 1 week follow-up. He has been using Hydrofera Blue under 3 layer compression. He has no issues or complaints today. He denies signs of infection. He states he overall feels well 5/23; patient presents for 1 week follow-up. He has been using Hydrofera Blue under 3 layer compression. He has no issues or complaints today. He denies signs of infection. 6/6; patient presents for 2-week follow-up. He has been using Hydrofera Blue under 3 layer compression. He has had no issues with this. He denies any signs of infection and has no complaints today. Readmissiondifferent wound 6/27 patient developed another wound to his right lower extremity. He states that 1 week ago he had a large admission for a different wound Paining that was hung over his couch fell on his leg. He has been using alcohol and keeping the area covered. He denies signs of infection. 7/5; patient presents for 1 week follow-up. He has been tolerating Hydrofera Blue under 3 layer compression. He does report  some pain to the wound site. He denies signs of infection. 7/11; patient presents for 1 week follow-up. He has been doing well with Hydrofera Blue under 3 layer compression. He reports improvement in pain since last clinic visit. He has no complaints today. He denies signs of infection. [name], [name] ([id number]) [id number] Page 7 of 12 7/18; patient presents for 1 week follow-up. Has been doing well with Hydrofera Blue under 3 layer compression. He denies signs of infection. Admission 05/24/2022 [name] is a 67 year old male with a past medical history of venous insufficiency that presents to the clinic for a 1 month history of nonhealing wound to his right lower extremity. He states that a wagon wheel hit it and caused a wound. He has been treated with Keflex for this issue By his podiatrist. He has been using silver alginate to the wound bed with dressing changes. He currently denies signs of infection. He is not wearing compression stockings. 8/15; patient presents for follow-up. We have been using antibiotic ointment with Hydrofera Blue under 3 layer compression. He tolerated the compression wrap well.  compression. He has no issues or complaints today. 9/7; patient presents for follow-up. We have been using collagen under 3 layer compression. Patient has no issues or complaints today. 9/12; patient presents for follow-up. We have been using collagen under 3 layer compression. Patient has no issues or complaints today. 9/21; patient presents for follow-up. We have been using endoform under 3 layer compression. Patient has no issues or complaints today. He tolerated the wrap well. 10/2; patient presents for follow-up. We have been using Xeroform under 3 layer compression. The wound is almost healed. Patient denies signs of infection. 10/9; patient presents for follow-up. We have been using Hydrofera Blue under 3 layer compression. His wound is healed. He has compression stockings at home. READMISSION 07/02/2023 ***FORMAL ABI STUDIES 10/22/2022*** +-------+-----------+-----------+------------+------------+ ABI/TBIT oday's ABIT oday's TBIPrevious ABIPrevious TBI +-------+-----------+-----------+------------+------------+ Right [place] 0.92 1.33 0.77  +-------+-----------+-----------+------------+------------+ Left [place] 0.83 1.16 0.82  +-------+-----------+-----------+------------+------------+ He returns to clinic today with new wounds on his bilateral lower  extremities. He suffered a fall at the end of July which resulted in an C7 nondisplaced fracture of the superior articular facet. He had bilateral knee lacerations and a left anterior tibial laceration. A recent CT scan also identified a subacute fracture through the C3 vertebral body. Neurosurgery evaluation is pending. The left knee laceration is closed. The right has an open area in the center with hypertrophic granulation tissue and slough. The left anterior tibial laceration is covered with a thick layer of rubbery slough. There is also a smaller proximal wound that has [name], [name] ([id number]) [id number] Page 4 of 12 some slough and eschar present. Edema control is poor; he admits to not wearing his compression garments. He is also supposed to be wearing a hard cervical collar and does not have this on today either. 07/09/2023: The proximal lower leg wound is closed. The distal is smaller, as is the right knee wound. There is hypertrophic granulation tissue on the right knee. Slough accumulation on both sites. 07/18/2023: The hypertrophic granulation tissue has not reaccumulated on the knee. The distal lower leg wound has contracted further and has healthy-looking granulation tissue present. Both wounds have a little bit of slough accumulation. Edema control is much better and he is actually wearing a compression stocking on his right leg. Electronic Signature(s) Signed: 07/18/2023 3:06:58 PM By: Duanne Guess MD FACS Entered By: Duanne Guess on 07/18/2023 12:06:58 -------------------------------------------------------------------------------- Physical Exam Details Patient Name: Date of Service: [name] RD L. 07/18/2023 2:00 PM Medical Record Number: [id number] Patient Account Number: [id number] Date of Birth/Sex: Treating RN: 1955/10/28 (67 y.o. M) Primary Care Provider: [name] Other Clinician: Referring Provider: Treating Provider/Extender:  Joeseph Amor in Treatment: 2 Constitutional Hypotensive, but normal for this patient. . . . no acute distress. Respiratory Normal work of breathing on room air. Notes 07/18/2023: The hypertrophic granulation tissue has not reaccumulated on the knee. The distal lower leg wound has contracted further and has healthy-looking granulation tissue present. Both wounds have a little bit of slough accumulation. Edema control is much better. Electronic Signature(s) Signed: 07/18/2023 3:07:36 PM By: Duanne Guess MD FACS Entered By: Duanne Guess on 07/18/2023 12:07:36 -------------------------------------------------------------------------------- Physician Orders Details Patient Name: Date of Service: Richardo Priest, [name] RD L. 07/18/2023 2:00 PM Medical Record Number: [id number] Patient Account Number: [id number] Date of Birth/Sex: Treating RN: 25-Nov-1955 (67 y.o. 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 - Dr. [name] - room 1 Return A [place] ([id number]) [id number] Page 5 of 12 Thursday 10/10 @ 2:45 pm Anesthetic (In clinic) Topical Lidocaine 4% applied to wound bed Bathing/ Shower/ Hygiene May shower with protection but do not get wound dressing(s) wet. Protect dressing(s) with water repellant cover (for example, large plastic bag) or a cast cover and may then take shower. Edema Control - Lymphedema /  SCD / Other Elevate legs to the level of the heart or above for 30 minutes daily and/or when sitting for 3-4 times a day throughout the day. Avoid standing for long periods of time. Patient to wear own compression stockings every day. - right leg daily Exercise regularly Moisturize legs daily. Wound Treatment Wound #4 - Knee Wound Laterality: Right Cleanser: Soap and Water 1 x Per Day/30 Days Discharge Instructions: May shower and wash wound with dial antibacterial soap and water prior to dressing change. Cleanser: Vashe 5.8 (oz) 1 x Per Day/30 Days Discharge Instructions: Cleanse the wound with Vashe prior to applying a clean dressing using gauze sponges, not tissue or cotton balls. Prim Dressing: Hydrofera Blue Classic Foam, 2x2 in 1 x Per Day/30 Days ary Discharge Instructions: Moisten with saline prior to applying to wound bed Secondary Dressing: Bordered Gauze, 4x4 in 1 x Per Day/30 Days Discharge Instructions: Apply over primary dressing as directed. Wound #5 - Lower Leg Wound Laterality: Left, Medial, Distal Cleanser: Soap and Water 1 x Per Week/30 Days Discharge Instructions: May shower and wash wound with dial antibacterial soap and water prior to dressing change. Cleanser: Vashe 5.8 (oz) 1 x Per Week/30 Days Discharge Instructions: Cleanse the wound with Vashe prior to applying a clean dressing using gauze sponges, not tissue or cotton balls. Peri-Wound Care: Sween Lotion (Moisturizing lotion) 1 x Per Week/30 Days Discharge Instructions: Apply moisturizing lotion as directed Prim Dressing: Hydrofera Blue Classic Foam, 4x4 in 1 x Per Week/30 Days ary Discharge Instructions: Moisten with saline prior to applying to wound bed Secondary Dressing: Woven Gauze Sponge, Non-Sterile 4x4 in 1 x Per Week/30 Days Discharge Instructions: Apply over primary dressing as directed. Compression Wrap: Urgo K2 Lite, (equivalent to a 3 layer) two layer compression system, regular 1 x Per Week/30  Days Discharge Instructions: Apply Urgo K2 Lite as directed (alternative to 3 layer compression). Electronic Signature(s) Signed: 07/18/2023 3:18:17 PM By: Duanne Guess MD FACS Entered By: Duanne Guess on 07/18/2023 12:11:24 -------------------------------------------------------------------------------- Problem List Details Patient Name: Date of Service: Richardo Priest, [name] RD L. 07/18/2023 2:00 PM Medical Record Number: [id number] Patient Account Number: [id number] Date of Birth/Sex: Treating RN: 04/06/1956 (67 y.o. [name] Primary Care Provider: [name] Other Clinician: Referring Provider: Treating Provider/Extender: Joeseph Amor in Treatment: 2 Active Problems ICD-10 Encounter [name], [name] ([id number]) [id number] Page 6 of 12 Encounter Code Description Active Date MDM Diagnosis [phone] Non-pressure chronic ulcer of other part of left lower leg with fat layer exposed9/17/2024 No Yes L97.812 Non-pressure chronic ulcer of other part of right lower leg with fat layer 07/02/2023 No Yes exposed E11.622 Type 2 diabetes mellitus with other skin ulcer 07/02/2023 No Yes I50.20 Unspecified systolic (congestive) heart  failure 07/02/2023 No Yes Inactive Problems Resolved Problems Electronic Signature(s) Signed: 07/18/2023 3:04:11 PM By: Duanne Guess MD FACS Entered By: Duanne Guess on 07/18/2023 12:04:11 -------------------------------------------------------------------------------- Progress Note Details Patient Name: Date of Service: [name] RD L. 07/18/2023 2:00 PM Medical Record Number: [id number] Patient Account Number: [id number] Date of Birth/Sex: Treating RN: April 12, 1956 (67 y.o. M) Primary Care Provider: [name] Other Clinician: Referring Provider: Treating Provider/Extender: Joeseph Amor in Treatment: 2 Subjective Chief Complaint Information obtained from Patient 05/24/2022;  right lower extremity wound 07/02/2023: returns with BLE wounds History of Present Illness (HPI) Admission 5/2 [name] is a 67 year old male with a past medical history of systolic congestive heart failure, COPD, nonischemic cardiomyopathy, type 2 diabetes, chronic venous insufficiency that presents to the clinic for a 10-month history of nonhealing wound to his left lower extremity. He states that in November 2021 he fell off his porch and Hit his leg against a brick. The wound has healed mostly except for 1 area that is still open. He reports moderate Serosanguineous drainage daily. He has been keeping it covered with Band-Aids. He reports 2 rounds of antibiotics for this issue. He denies pain. He denies current acute signs of infection. 5/9; patient presents for 1 week follow-up. He has been using Hydrofera Blue under 3 layer compression. He has no issues or complaints today. He denies signs of infection. 5/16; patient presents for 1 week follow-up. He has been using Hydrofera Blue under 3 layer compression. He has no issues or complaints today. He denies signs of infection. He states he overall feels well 5/23; patient presents for 1 week follow-up. He has been using Hydrofera Blue under 3 layer compression. He has no issues or complaints today. He denies signs of infection. 6/6; patient presents for 2-week follow-up. He has been using Hydrofera Blue under 3 layer compression. He has had no issues with this. He denies any signs of infection and has no complaints today. Readmissiondifferent wound 6/27 patient developed another wound to his right lower extremity. He states that 1 week ago he had a large admission for a different wound Paining that was hung over his couch fell on his leg. He has been using alcohol and keeping the area covered. He denies signs of infection. 7/5; patient presents for 1 week follow-up. He has been tolerating Hydrofera Blue under 3 layer compression. He does report  some pain to the wound site. He denies signs of infection. 7/11; patient presents for 1 week follow-up. He has been doing well with Hydrofera Blue under 3 layer compression. He reports improvement in pain since last clinic visit. He has no complaints today. He denies signs of infection. [name], [name] ([id number]) [id number] Page 7 of 12 7/18; patient presents for 1 week follow-up. Has been doing well with Hydrofera Blue under 3 layer compression. He denies signs of infection. Admission 05/24/2022 [name] is a 67 year old male with a past medical history of venous insufficiency that presents to the clinic for a 1 month history of nonhealing wound to his right lower extremity. He states that a wagon wheel hit it and caused a wound. He has been treated with Keflex for this issue By his podiatrist. He has been using silver alginate to the wound bed with dressing changes. He currently denies signs of infection. He is not wearing compression stockings. 8/15; patient presents for follow-up. We have been using antibiotic ointment with Hydrofera Blue under 3 layer compression. He tolerated the compression wrap well.  failure 07/02/2023 No Yes Inactive Problems Resolved Problems Electronic Signature(s) Signed: 07/18/2023 3:04:11 PM By: Duanne Guess MD FACS Entered By: Duanne Guess on 07/18/2023 12:04:11 -------------------------------------------------------------------------------- Progress Note Details Patient Name: Date of Service: [name] RD L. 07/18/2023 2:00 PM Medical Record Number: [id number] Patient Account Number: [id number] Date of Birth/Sex: Treating RN: April 12, 1956 (67 y.o. M) Primary Care Provider: [name] Other Clinician: Referring Provider: Treating Provider/Extender: Joeseph Amor in Treatment: 2 Subjective Chief Complaint Information obtained from Patient 05/24/2022;  right lower extremity wound 07/02/2023: returns with BLE wounds History of Present Illness (HPI) Admission 5/2 [name] is a 67 year old male with a past medical history of systolic congestive heart failure, COPD, nonischemic cardiomyopathy, type 2 diabetes, chronic venous insufficiency that presents to the clinic for a 10-month history of nonhealing wound to his left lower extremity. He states that in November 2021 he fell off his porch and Hit his leg against a brick. The wound has healed mostly except for 1 area that is still open. He reports moderate Serosanguineous drainage daily. He has been keeping it covered with Band-Aids. He reports 2 rounds of antibiotics for this issue. He denies pain. He denies current acute signs of infection. 5/9; patient presents for 1 week follow-up. He has been using Hydrofera Blue under 3 layer compression. He has no issues or complaints today. He denies signs of infection. 5/16; patient presents for 1 week follow-up. He has been using Hydrofera Blue under 3 layer compression. He has no issues or complaints today. He denies signs of infection. He states he overall feels well 5/23; patient presents for 1 week follow-up. He has been using Hydrofera Blue under 3 layer compression. He has no issues or complaints today. He denies signs of infection. 6/6; patient presents for 2-week follow-up. He has been using Hydrofera Blue under 3 layer compression. He has had no issues with this. He denies any signs of infection and has no complaints today. Readmissiondifferent wound 6/27 patient developed another wound to his right lower extremity. He states that 1 week ago he had a large admission for a different wound Paining that was hung over his couch fell on his leg. He has been using alcohol and keeping the area covered. He denies signs of infection. 7/5; patient presents for 1 week follow-up. He has been tolerating Hydrofera Blue under 3 layer compression. He does report  some pain to the wound site. He denies signs of infection. 7/11; patient presents for 1 week follow-up. He has been doing well with Hydrofera Blue under 3 layer compression. He reports improvement in pain since last clinic visit. He has no complaints today. He denies signs of infection. [name], [name] ([id number]) [id number] Page 7 of 12 7/18; patient presents for 1 week follow-up. Has been doing well with Hydrofera Blue under 3 layer compression. He denies signs of infection. Admission 05/24/2022 [name] is a 67 year old male with a past medical history of venous insufficiency that presents to the clinic for a 1 month history of nonhealing wound to his right lower extremity. He states that a wagon wheel hit it and caused a wound. He has been treated with Keflex for this issue By his podiatrist. He has been using silver alginate to the wound bed with dressing changes. He currently denies signs of infection. He is not wearing compression stockings. 8/15; patient presents for follow-up. We have been using antibiotic ointment with Hydrofera Blue under 3 layer compression. He tolerated the compression wrap well.  Discharge Instructions: Moisten with saline prior to applying to wound bed Secondary Dressing: Woven Gauze Sponge, Non-Sterile 4x4 in 1 x Per Week/30 Days Discharge Instructions: Apply over primary dressing as directed. Com pression Wrap: Urgo K2 Lite, (equivalent to a 3 layer) two layer compression  system, regular 1 x Per Week/30 Days Discharge Instructions: Apply Urgo K2 Lite as directed (alternative to 3 layer compression). 07/18/2023: The hypertrophic granulation tissue has not reaccumulated on the knee. The distal lower leg wound has contracted further and has healthy-looking granulation tissue present. Both wounds have a little bit of slough accumulation. Edema control is much better and he is actually wearing a compression stocking on his right leg. I used a curette to debride slough and eschar from the knee and slough from the left leg. We will continue Hydrofera Blue classic to both sites with Urgo light compression on the left leg. I encouraged him to continue to wear the compression on his right leg, as well. Follow-up in 1 week. Electronic Signature(s) Signed: 07/18/2023 3:12:07 PM By: Duanne Guess MD FACS Entered By: Duanne Guess on 07/18/2023 12:12:07 HxROS Details -------------------------------------------------------------------------------- [name] ([id number]) [id number] Page 10 of 12 Patient Name: Date of Service: [name] RD L. 07/18/2023 2:00 PM Medical Record Number: [id number] Patient Account Number: [id number] Date of Birth/Sex: Treating RN: 12-Apr-1956 (67 y.o. M) Primary Care Provider: [name] Other Clinician: Referring Provider: Treating Provider/Extender: Joeseph Amor in Treatment: 2 Information Obtained From Patient Chart Constitutional Symptoms (General Health) Medical History: Past Medical History Notes: obesity Eyes Medical History: Positive for: Cataracts; Glaucoma Negative for: Optic Neuritis Hematologic/Lymphatic Medical History: Positive for: Anemia Respiratory Medical History: Positive for: Asthma; Chronic Obstructive Pulmonary Disease (COPD); Sleep Apnea - sleeps in recliner Negative for: Pneumothorax Past Medical History Notes: O2 dependent Cardiovascular Medical  History: Positive for: Arrhythmia - afib; Congestive Heart Failure; Deep Vein Thrombosis; Myocardial Infarction; Peripheral Venous Disease Past Medical History Notes: cardiomyopathy, pacemaker/defibulator Gastrointestinal Medical History: Past Medical History Notes: GERD Endocrine Medical History: Positive for: Type II Diabetes Negative for: Type I Diabetes Past Medical History Notes: thyrotoxicosis Time with diabetes: 10 yrs Treated with: Diet Blood sugar tested every day: No Genitourinary Medical History: Negative for: End Stage Renal Disease Past Medical History Notes: hx kidney stones, BPH Integumentary (Skin) Medical History: Negative for: History of Burn Musculoskeletal Medical History: Positive for: Osteoarthritis Past Medical History Notes: thoracic and lumbosacral spondylosis, fibromyalgia, Right lumbar radiculopathy, De Quervain's tenosynovitis - right Neurologic [name], [name] ([id number]) [id number] Page 11 of 12 Medical History: Positive for: Neuropathy Oncologic Medical History: Negative for: Received Chemotherapy; Received Radiation Psychiatric Medical History: Negative for: Anorexia/bulimia; Confinement Anxiety Past Medical History Notes: depression, anxiety HBO Extended History Items Eyes: Eyes: Cataracts Glaucoma Immunizations Pneumococcal Vaccine: Received Pneumococcal Vaccination: Yes Received Pneumococcal Vaccination On or After 60th Birthday: Yes Implantable Devices Yes Family and Social History Cancer: No; Diabetes: No; Heart Disease: Yes - Father; Hereditary Spherocytosis: No; Hypertension: Yes - Mother,Father,Siblings; Kidney Disease: No; Lung Disease: Yes - Mother; Seizures: No; Stroke: No; Thyroid Problems: Yes - Mother; Tuberculosis: No; Former smoker - quit 2009; Marital Status - Divorced; Alcohol Use: Never; Drug Use: No History; Caffeine Use: Daily - coffee, diet soda; Financial Concerns: No; Food,  Clothing or Shelter Needs: No; Support System Lacking: No; Transportation Concerns: No Psychologist, prison and probation services) Signed: 07/18/2023 3:18:17 PM By: Duanne Guess MD FACS Entered By: Duanne Guess on 07/18/2023 12:07:06 -------------------------------------------------------------------------------- SuperBill Details Patient Name: Date of Service: [name], Zachary RD L.  compression. He has no issues or complaints today. 9/7; patient presents for follow-up. We have been using collagen under 3 layer compression. Patient has no issues or complaints today. 9/12; patient presents for follow-up. We have been using collagen under 3 layer compression. Patient has no issues or complaints today. 9/21; patient presents for follow-up. We have been using endoform under 3 layer compression. Patient has no issues or complaints today. He tolerated the wrap well. 10/2; patient presents for follow-up. We have been using Xeroform under 3 layer compression. The wound is almost healed. Patient denies signs of infection. 10/9; patient presents for follow-up. We have been using Hydrofera Blue under 3 layer compression. His wound is healed. He has compression stockings at home. READMISSION 07/02/2023 ***FORMAL ABI STUDIES 10/22/2022*** +-------+-----------+-----------+------------+------------+ ABI/TBIT oday's ABIT oday's TBIPrevious ABIPrevious TBI +-------+-----------+-----------+------------+------------+ Right [place] 0.92 1.33 0.77  +-------+-----------+-----------+------------+------------+ Left [place] 0.83 1.16 0.82  +-------+-----------+-----------+------------+------------+ He returns to clinic today with new wounds on his bilateral lower  extremities. He suffered a fall at the end of July which resulted in an C7 nondisplaced fracture of the superior articular facet. He had bilateral knee lacerations and a left anterior tibial laceration. A recent CT scan also identified a subacute fracture through the C3 vertebral body. Neurosurgery evaluation is pending. The left knee laceration is closed. The right has an open area in the center with hypertrophic granulation tissue and slough. The left anterior tibial laceration is covered with a thick layer of rubbery slough. There is also a smaller proximal wound that has [name], [name] ([id number]) [id number] Page 4 of 12 some slough and eschar present. Edema control is poor; he admits to not wearing his compression garments. He is also supposed to be wearing a hard cervical collar and does not have this on today either. 07/09/2023: The proximal lower leg wound is closed. The distal is smaller, as is the right knee wound. There is hypertrophic granulation tissue on the right knee. Slough accumulation on both sites. 07/18/2023: The hypertrophic granulation tissue has not reaccumulated on the knee. The distal lower leg wound has contracted further and has healthy-looking granulation tissue present. Both wounds have a little bit of slough accumulation. Edema control is much better and he is actually wearing a compression stocking on his right leg. Electronic Signature(s) Signed: 07/18/2023 3:06:58 PM By: Duanne Guess MD FACS Entered By: Duanne Guess on 07/18/2023 12:06:58 -------------------------------------------------------------------------------- Physical Exam Details Patient Name: Date of Service: [name] RD L. 07/18/2023 2:00 PM Medical Record Number: [id number] Patient Account Number: [id number] Date of Birth/Sex: Treating RN: 1955/10/28 (67 y.o. M) Primary Care Provider: [name] Other Clinician: Referring Provider: Treating Provider/Extender:  Joeseph Amor in Treatment: 2 Constitutional Hypotensive, but normal for this patient. . . . no acute distress. Respiratory Normal work of breathing on room air. Notes 07/18/2023: The hypertrophic granulation tissue has not reaccumulated on the knee. The distal lower leg wound has contracted further and has healthy-looking granulation tissue present. Both wounds have a little bit of slough accumulation. Edema control is much better. Electronic Signature(s) Signed: 07/18/2023 3:07:36 PM By: Duanne Guess MD FACS Entered By: Duanne Guess on 07/18/2023 12:07:36 -------------------------------------------------------------------------------- Physician Orders Details Patient Name: Date of Service: Richardo Priest, [name] RD L. 07/18/2023 2:00 PM Medical Record Number: [id number] Patient Account Number: [id number] Date of Birth/Sex: Treating RN: 25-Nov-1955 (67 y.o. [name] Primary Care Provider: [name] Other Clinician: Referring Provider: Treating Provider/Extender: Joeseph Amor in Treatment: 2 Verbal / Phone Orders:  07/18/2023 Medical Record Number: [id number] Patient Account Number: [id number] Date of Birth/Sex: Treating RN: 1955/11/23 (67 y.o. M) Primary Care Provider: [name] Other Clinician: Referring Provider: Treating Provider/Extender: Joeseph Amor in Treatment: 2 Diagnosis Coding ICD-10 Codes Code Description [phone] Non-pressure chronic ulcer of other part of left lower leg with fat layer exposed L97.812 Non-pressure chronic ulcer of other part of right lower leg with fat layer exposed E11.622 Type 2 diabetes mellitus with other skin ulcer I50.20 Unspecified systolic (congestive) heart failure Facility Procedures Physician Procedures : CPT4 Code Description Modifier [id number] - WC PHYS LEVEL 3 - EST PT 25 ICD-10 Diagnosis Description L97.822 Non-pressure chronic ulcer of other part of left lower leg with fat layer exposed L97.812 Non-pressure chronic ulcer of other part of right  lower leg with fat layer exposed E11.622 Type 2 diabetes mellitus with other skin ulcer I50.20 Unspecified systolic (congestive) heart failure Quantity: 1 : [id number] - WC PHYS DEBR WO ANESTH 20 SQ CM ICD-10 Diagnosis Description L97.822 Non-pressure chronic ulcer of other part of left lower leg with fat layer exposed L97.812 Non-pressure chronic ulcer of other part of right lower leg with fat layer  exposed Quantity: 1 Electronic Signature(s) Signed: 07/18/2023 3:13:37 PM By: Duanne Guess MD FACS Entered By: Duanne Guess on 07/18/2023 12:13:37  07/18/2023 Medical Record Number: [id number] Patient Account Number: [id number] Date of Birth/Sex: Treating RN: 1955/11/23 (67 y.o. M) Primary Care Provider: [name] Other Clinician: Referring Provider: Treating Provider/Extender: Joeseph Amor in Treatment: 2 Diagnosis Coding ICD-10 Codes Code Description [phone] Non-pressure chronic ulcer of other part of left lower leg with fat layer exposed L97.812 Non-pressure chronic ulcer of other part of right lower leg with fat layer exposed E11.622 Type 2 diabetes mellitus with other skin ulcer I50.20 Unspecified systolic (congestive) heart failure Facility Procedures Physician Procedures : CPT4 Code Description Modifier [id number] - WC PHYS LEVEL 3 - EST PT 25 ICD-10 Diagnosis Description L97.822 Non-pressure chronic ulcer of other part of left lower leg with fat layer exposed L97.812 Non-pressure chronic ulcer of other part of right  lower leg with fat layer exposed E11.622 Type 2 diabetes mellitus with other skin ulcer I50.20 Unspecified systolic (congestive) heart failure Quantity: 1 : [id number] - WC PHYS DEBR WO ANESTH 20 SQ CM ICD-10 Diagnosis Description L97.822 Non-pressure chronic ulcer of other part of left lower leg with fat layer exposed L97.812 Non-pressure chronic ulcer of other part of right lower leg with fat layer  exposed Quantity: 1 Electronic Signature(s) Signed: 07/18/2023 3:13:37 PM By: Duanne Guess MD FACS Entered By: Duanne Guess on 07/18/2023 12:13:37  RACER, QUAM ([id number]) [id number] Page 1 of 12 Visit Report for 07/18/2023 Chief Complaint Document Details Patient Name: Date of Service: [name] RD L. 07/18/2023 2:00 PM Medical Record Number: [id number] Patient Account Number: [id number] Date of Birth/Sex: Treating RN: 20-Jul-1956 (67 y.o. M) Primary Care Provider: [name] Other Clinician: Referring Provider: Treating Provider/Extender: Joeseph Amor in Treatment: 2 Information Obtained from: Patient Chief Complaint 05/24/2022; right lower extremity wound 07/02/2023: returns with BLE wounds Electronic Signature(s) Signed: 07/18/2023 3:05:56 PM By: Duanne Guess MD FACS Entered By: Duanne Guess on 07/18/2023 12:05:56 -------------------------------------------------------------------------------- Debridement Details Patient Name: Date of Service: [name] RD L. 07/18/2023 2:00 PM Medical Record Number: [id number] Patient Account Number: [id number] Date of Birth/Sex: Treating RN: Jul 17, 1956 (67 y.o. [name] Primary Care Provider: [name] Other Clinician: Referring Provider: Treating Provider/Extender: Joeseph Amor in Treatment: 2 Debridement Performed for Assessment: Wound #4 Right Knee Performed By: Physician Duanne Guess, MD The following information was scribed by: Zenaida Deed The information was scribed for: Duanne Guess Debridement Type: Debridement Severity of Tissue Pre Debridement: Fat layer exposed Level of Consciousness (Pre-procedure): Awake and Alert Pre-procedure Verification/Time Out Yes - 14:50 Taken: Start Time: 14:53 Pain Control: Lidocaine 4% Topical Solution Percent of Wound Bed Debrided: 100% T Area Debrided (cm): otal 0.55 Tissue and other material debrided: Non-Viable, Eschar, Slough, Slough Level: Non-Viable Tissue Debridement Description: Selective/Open Wound Instrument:  Curette Bleeding: Minimum Hemostasis Achieved: Pressure Procedural Pain: 0 Post Procedural Pain: 0 Response to Treatment: Procedure was tolerated well Level of Consciousness (Post- Awake and Alert procedure): Post Debridement Measurements of Total Wound Length: (cm) 0.7 Width: (cm) 1 Depth: (cm) 0.1 KHANH, TANORI ([id number]) [id number] Page 2 of 12 Volume: (cm) 0.055 Character of Wound/Ulcer Post Debridement: Improved Severity of Tissue Post Debridement: Fat layer exposed Post Procedure Diagnosis Same as Pre-procedure Electronic Signature(s) Signed: 07/18/2023 3:18:17 PM By: Duanne Guess MD FACS Signed: 07/18/2023 4:38:08 PM By: Zenaida Deed RN, BSN Entered By: Zenaida Deed on 07/18/2023 11:55:26 -------------------------------------------------------------------------------- Debridement Details Patient Name: Date of Service: [name] RD L. 07/18/2023 2:00 PM Medical Record Number: [id number] Patient Account Number: [id number] Date of Birth/Sex: Treating RN: May 05, 1956 (67 y.o. [name] Primary Care Provider: [name] Other Clinician: Referring Provider: Treating Provider/Extender: Joeseph Amor in Treatment: 2 Debridement Performed for Assessment: Wound #5 Left,Distal,Medial Lower Leg Performed By: Physician Duanne Guess, MD The following information was scribed by: Zenaida Deed The information was scribed for: Duanne Guess Debridement Type: Debridement Severity of Tissue Pre Debridement: Fat layer exposed Level of Consciousness (Pre-procedure): Awake and Alert Pre-procedure Verification/Time Out Yes - 14:50 Taken: Start Time: 14:53 Pain Control: Lidocaine 4% T opical Solution Percent of Wound Bed Debrided: 100% T Area Debrided (cm): otal 1.55 Tissue and other material debrided: Non-Viable, Slough, Slough Level: Non-Viable Tissue Debridement Description: Selective/Open  Wound Instrument: Curette Bleeding: Minimum Hemostasis Achieved: Pressure Procedural Pain: 0 Post Procedural Pain: 0 Response to Treatment: Procedure was tolerated well Level of Consciousness (Post- Awake and Alert procedure): Post Debridement Measurements of Total Wound Length: (cm) 2.2 Width: (cm) 0.9 Depth: (cm) 0.1 Volume: (cm) 0.156 Character of Wound/Ulcer Post Debridement: Improved Severity of Tissue Post Debridement: Fat layer exposed Post Procedure Diagnosis Same as Pre-procedure Electronic Signature(s) Signed: 07/18/2023 3:18:17 PM By: Duanne Guess MD FACS Signed: 07/18/2023 4:38:08 PM By: Zenaida Deed RN, BSN Entered By: Zenaida Deed on 07/18/2023 11:56:04 [name] ([id number]) [id number] Page 3 of 12 --------------------------------------------------------------------------------

## 2023-07-18 NOTE — Telephone Encounter (Signed)
Left message for patient to return call to office at [phone].

## 2023-07-19 NOTE — Telephone Encounter (Signed)
Called and spoke with the pt and made aware of results and recommendations  He would like to wait and discuss with JE at upcoming visit  He recently had a fall where he hurt his neck and is not wanting to start CPAP therapy yet due to this  Will route to JE as [place]

## 2023-07-22 NOTE — Telephone Encounter (Signed)
Patient scheduled with NSG on 08/01/23

## 2023-07-23 ENCOUNTER — Encounter: Payer: Self-pay | Admitting: Pulmonary Disease

## 2023-07-24 ENCOUNTER — Ambulatory Visit (INDEPENDENT_AMBULATORY_CARE_PROVIDER_SITE_OTHER): Payer: Medicare HMO

## 2023-07-24 ENCOUNTER — Encounter: Payer: Medicare HMO | Admitting: Physical Medicine & Rehabilitation

## 2023-07-24 DIAGNOSIS — Z95810 Presence of automatic (implantable) cardiac defibrillator: Secondary | ICD-10-CM

## 2023-07-24 DIAGNOSIS — I5022 Chronic systolic (congestive) heart failure: Secondary | ICD-10-CM

## 2023-07-24 DIAGNOSIS — I442 Atrioventricular block, complete: Secondary | ICD-10-CM | POA: Diagnosis not present

## 2023-07-25 ENCOUNTER — Ambulatory Visit (HOSPITAL_BASED_OUTPATIENT_CLINIC_OR_DEPARTMENT_OTHER): Payer: Medicare HMO | Admitting: General Surgery

## 2023-07-25 LAB — CUP PACEART REMOTE DEVICE CHECK
Battery Remaining Longevity: 15 mo
Battery Voltage: 2.89 V
Brady Statistic AP VP Percent: 0 %
Brady Statistic AP VS Percent: 0 %
Brady Statistic AS VP Percent: 0 %
Brady Statistic AS VS Percent: 0 %
Brady Statistic RA Percent Paced: 0 %
Brady Statistic RV Percent Paced: 99.81 %
Date Time Interrogation Session: 20241009164744
HighPow Impedance: 87 Ohm
Implantable Lead Connection Status: 753985
Implantable Lead Connection Status: 753985
Implantable Lead Implant Date: 20181205
Implantable Lead Implant Date: 20181205
Implantable Lead Location: 753858
Implantable Lead Location: 753860
Implantable Lead Model: 4398
Implantable Pulse Generator Implant Date: 20181205
Lead Channel Impedance Value: 1083 Ohm
Lead Channel Impedance Value: 1178 Ohm
Lead Channel Impedance Value: 1197 Ohm
Lead Channel Impedance Value: 1311 Ohm
Lead Channel Impedance Value: 1311 Ohm
Lead Channel Impedance Value: 256.5 Ohm
Lead Channel Impedance Value: 325.824
Lead Channel Impedance Value: 325.824
Lead Channel Impedance Value: 325.824
Lead Channel Impedance Value: 325.824
Lead Channel Impedance Value: 361 Ohm
Lead Channel Impedance Value: 4047 Ohm
Lead Channel Impedance Value: 418 Ohm
Lead Channel Impedance Value: 513 Ohm
Lead Channel Impedance Value: 513 Ohm
Lead Channel Impedance Value: 893 Ohm
Lead Channel Impedance Value: 893 Ohm
Lead Channel Impedance Value: 950 Ohm
Lead Channel Pacing Threshold Amplitude: 0.375 V
Lead Channel Pacing Threshold Pulse Width: 0.4 ms
Lead Channel Sensing Intrinsic Amplitude: 17.125 mV
Lead Channel Sensing Intrinsic Amplitude: 17.125 mV
Lead Channel Setting Pacing Amplitude: 2.5 V
Lead Channel Setting Pacing Amplitude: 2.5 V
Lead Channel Setting Pacing Pulse Width: 0.4 ms
Lead Channel Setting Pacing Pulse Width: 1 ms
Lead Channel Setting Sensing Sensitivity: 0.3 mV
Zone Setting Status: 755011

## 2023-07-25 NOTE — Progress Notes (Signed)
[name] [address] [place] [address] Phone: [phone] Subjective:   [name], am serving as a scribe for Dr. Antoine Primas.  I'm seeing this patient by the request  of:  [name], MD  CC: Shoulder, neck pain, allover pain follow-up  [id number]  05/24/2023 Bilateral injections May 24, 2023.  Worsening pain, chronic condition.  Discussed with him that with the neck as well I am concerned that this is some radicular symptoms.  Patient's neck is in more of a flexed state with a right-sided sidebending.  Discussed with patient about icing regimen and home exercises.  Discussed with patient to monitor closely.  Do not feel that other injections would be as beneficial right now and if worsening symptoms need to consider looking closer to the neck.  Patient has good grip strength at the moment but if this seems to start giving him difficulty to seek medical attention immediately.    Discussed with patient at great length. Not wearing the brace like he is supposed to. Did write him for a soft brace. Encouraged him to follow-up as well with neurosurgery which she states he has 1 next week. Patient knows if worsening symptoms to seek medical attention immediately.   Updated 07/30/2023 [name] is a 67 y.o. male coming in with complaint of shoulder and neck pain. Injections in shoulders wore off about 2-3 weeks ago. Patient is having neck pain as well.  Did find that there is a subacute fracture at the C3-C4.  Patient is awaiting an MRI at this time      Past Medical History:  Diagnosis Date   AICD (automatic cardioverter/defibrillator) present    Anemia    supposed to be taking Vit B but doesn't   ANXIETY    takes Xanax nightly   Arthritis    Asthma    Albuterol prn and Advair daily;also takes Prednisone daily   Cardiomyopathy (HCC)    a. EF 25% TEE July 2013; b. EF normalized 2015;  c. 03/2015 Echo: EF 40-45%, difrf HK, PASP  38 mmHg, Mild MR, sev LAE/RAE.   Chronic constipation    takes OTC stool softener   COPD (chronic obstructive pulmonary disease) (HCC)    "one dr says COPD; one dr says emphysema" (09/18/2017)   COVID-19 12/09/2019   DEPRESSION    takes Zoloft and Doxepin daily   Diverticulitis    DYSKINESIA, ESOPHAGUS    Essential hypertension        FIBROMYALGIA    GERD (gastroesophageal reflux disease)        Glaucoma    HYPERLIPIDEMIA    a. Intolerant to statins.   INSOMNIA    takes Ambien nightly   Myocardial infarction [facility])    a. 2012 Myoview notable for prior infarct;  b. 03/2015 Lexiscan CL: EF 37%, diff HK, small area of inferior infarct from apex to base-->Med Rx.   O2 dependent    "2.5L q hs & prn" (09/18/2017)   Paroxysmal atrial fibrillation (HCC)    a. CHA2DS2VASc = 3--> takes Coumadin;  b. 03/15/2015 Successful TEE/DCCV;  c. 03/2015 recurrent afib, Amio d/c'd in setting of hyperthyroidism.   Peripheral neuropathy    Pneumonia 12/2016   Rash and other nonspecific skin eruption 04/12/2009   no cause found saw dermatologists x 2 and allergist   SLEEP APNEA, OBSTRUCTIVE    a. doesn't use CPAP   Syncope    a. 03/2015 s/p MDT LINQ.  Type II diabetes mellitus (HCC)        Past Surgical History:  Procedure Laterality Date   ACNE CYST REMOVAL     2 on back    AV NODE ABLATION N/A 10/25/2017   Procedure: AV NODE ABLATION;  Surgeon: Duke Salvia, MD;  Location: [facility] INVASIVE CV LAB;  Service: Cardiovascular;  Laterality: N/A;   BIV ICD INSERTION CRT-D N/A 09/18/2017   Procedure: BIV ICD INSERTION CRT-D;  Surgeon: Duke Salvia, MD;  Location: [facility] INVASIVE CV LAB;  Service: Cardiovascular;  Laterality: N/A;   CARDIAC CATHETERIZATION N/A 03/21/2016   Procedure: Right/Left Heart Cath and Coronary Angiography;  Surgeon: Laurey Morale, MD;  Location: [facility] INVASIVE CV LAB;  Service: Cardiovascular;  Laterality: N/A;   CARDIOVERSION  04/18/2012   Procedure: CARDIOVERSION;  Surgeon: Pricilla Riffle,  MD;  Location: [facility] OR;  Service: Cardiovascular;  Laterality: N/A;   CARDIOVERSION  04/25/2012   Procedure: CARDIOVERSION;  Surgeon: Vesta Mixer, MD;  Location: [facility] ENDOSCOPY;  Service: Cardiovascular;  Laterality: N/A;   CARDIOVERSION  04/25/2012   Procedure: CARDIOVERSION;  Surgeon: Pricilla Riffle, MD;  Location: [facility] OR;  Service: Cardiovascular;  Laterality: N/A;   CARDIOVERSION  05/09/2012   Procedure: CARDIOVERSION;  Surgeon: Tonny Bollman, MD;  Location: [facility] OR;  Service: Cardiovascular;  Laterality: N/A;  changed from crenshaw to cooper by trish/leone-endo   CARDIOVERSION N/A 03/15/2015   Procedure: CARDIOVERSION;  Surgeon: Vesta Mixer, MD;  Location: [facility] ENDOSCOPY;  Service: Cardiovascular;  Laterality: N/A;   COLONOSCOPY     COLONOSCOPY WITH PROPOFOL N/A 10/21/2014   Procedure: COLONOSCOPY WITH PROPOFOL;  Surgeon: Meryl Dare, MD;  Location: [facility] ENDOSCOPY;  Service: Endoscopy;  Laterality: N/A;   EP IMPLANTABLE DEVICE N/A 04/06/2015   Procedure: Loop Recorder Insertion;  Surgeon: Marinus Maw, MD;  Location: [facility] INVASIVE CV LAB;  Service: Cardiovascular;  Laterality: N/A;   ESOPHAGOGASTRODUODENOSCOPY     JOINT REPLACEMENT     LOOP RECORDER REMOVAL N/A 09/18/2017   Procedure: LOOP RECORDER REMOVAL;  Surgeon: Duke Salvia, MD;  Location[facility] INVASIVE CV LAB;  Service: Cardiovascular;  Laterality: N/A;   RIGHT/LEFT HEART CATH AND CORONARY ANGIOGRAPHY N/A 01/28/2017   Procedure: Right/Left Heart Cath and Coronary Angiography;  Surgeon: Laurey Morale, MD;  Location: [facility] INVASIVE CV LAB;  Service: Cardiovascular;  Laterality: N/A;   TEE WITHOUT CARDIOVERSION  04/25/2012   Procedure: TRANSESOPHAGEAL ECHOCARDIOGRAM (TEE);  Surgeon: Vesta Mixer, MD;  Location: [facility] ENDOSCOPY;  Service: Cardiovascular;  Laterality: N/A;   TEE WITHOUT CARDIOVERSION N/A 03/15/2015   Procedure: TRANSESOPHAGEAL ECHOCARDIOGRAM (TEE);  Surgeon: Vesta Mixer, MD;  Location: [facility] ENDOSCOPY;  Service: Cardiovascular;   Laterality: N/A;   TONSILLECTOMY AND ADENOIDECTOMY     TOTAL KNEE ARTHROPLASTY Right 06/15/2014   Procedure: TOTAL KNEE ARTHROPLASTY;  Surgeon: Sheral Apley, MD;  Location: [facility] OR;  Service: Orthopedics;  Laterality: Right;   TOTAL KNEE ARTHROPLASTY Left 10/17/2021   Procedure: Left TOTAL KNEE ARTHROPLASTY;  Surgeon: Kathryne Hitch, MD;  Location: [facility] OR;  Service: Orthopedics;  Laterality: Left;   Social History   Socioeconomic History   Marital status: Divorced    Spouse name: Not on file   Number of children: 2   Years of education: Not on file   Highest education level: Not on file  Occupational History   Occupation: retired/disabled. prev worked in Teacher, English as a foreign language.    Employer: DISABLED  Tobacco Use   Smoking status: Former  Current packs/day: 0.00    Average packs/day: 2.0 packs/day for 30.0 years (60.0 ttl pk-yrs)    Types: Cigarettes    Start date: 10/15/1977    Quit date: 10/16/2007    Years since quitting: 15.7   Smokeless tobacco: Never  Vaping Use   Vaping status: Never Used  Substance and Sexual Activity   Alcohol use: No   Drug use: No   Sexual activity: Not Currently  Other Topics Concern   Not on file  Social History Narrative   Lives alone.   Social Determinants of Health   Financial Resource Strain: Low Risk  (06/29/2022)   Overall Financial Resource Strain (CARDIA)    Difficulty of Paying Living Expenses: Not hard at all  Food Insecurity: No Food Insecurity (11/26/2022)   Hunger Vital Sign    Worried About Running Out of Food in the Last Year: Never true    Ran Out of Food in the Last Year: Never true  Transportation Needs: No Transportation Needs (11/26/2022)   PRAPARE - Administrator, Civil Service (Medical): No    Lack of Transportation (Non-Medical): No  Physical Activity: Inactive (06/29/2022)   Exercise Vital Sign    Days of Exercise per Week: 0 days    Minutes of Exercise per Session: 0 min  Stress: No Stress Concern  Present (06/29/2022)   [employer] of Occupational Health - Occupational Stress Questionnaire    Feeling of Stress : Not at all  Social Connections: Moderately Integrated (06/29/2022)   Social Connection and Isolation Panel [NHANES]    Frequency of Communication with Friends and Family: More than three times a week    Frequency of Social Gatherings with Friends and Family: More than three times a week    Attends Religious Services: More than 4 times per year    Active Member of [employer] or Organizations: Yes    Attends Engineer, structural: More than 4 times per year    Marital Status: Divorced   Allergies  Allergen Reactions   Tape Other (See Comments)    SKIN TEARS VERY, VERY EASILY!!!! Use Paper Tape Only, PLEASE!!!   Amiodarone Other (See Comments)    Caused hyperthyroidism    Statins Other (See Comments)    Myalgias    Sulfa Antibiotics Other (See Comments)    Mouth ulcers   Wound Dressing Adhesive Other (See Comments)    Skin Tears Use Paper Tape Only   Family History  Problem Relation Age of Onset   COPD Mother    Asthma Mother    Colon polyps Mother    Allergies Mother    Hypothyroidism Mother    Asthma Maternal Grandmother    Colon cancer Neg Hx     Current Outpatient Medications (Endocrine & Metabolic):    predniSONE (DELTASONE) 10 MG tablet, Take 1 tablet (10 mg total) by mouth daily with breakfast.  Current Outpatient Medications (Cardiovascular):    carvedilol (COREG) 3.125 MG tablet, Take 1 tablet (3.125 mg total) by mouth 2 (two) times daily.   eplerenone (INSPRA) 25 MG tablet, Take 1 tablet (25 mg total) by mouth daily.   Evolocumab (REPATHA SURECLICK) 140 MG/ML SOAJ, Inject 140 mg into the skin every 14 (fourteen) days.   losartan (COZAAR) 25 MG tablet, Take 1 tablet (25 mg total) by mouth daily.   torsemide (DEMADEX) 20 MG tablet, Take 4 tablets (80 mg total) by mouth 2 (two) times daily.  Current Outpatient Medications (Respiratory):     albuterol (  VENTOLIN HFA) 108 (90 Base) MCG/ACT inhaler, Inhale 1-2 puffs into the lungs every 6 (six) hours as needed for wheezing or shortness of breath.   fluticasone (FLONASE) 50 MCG/ACT nasal spray, Place 2 sprays into both nostrils daily as needed for allergies or rhinitis.   fluticasone-salmeterol (WIXELA INHUB) 250-50 MCG/ACT AEPB, Inhale 1 puff into the lungs in the morning and at bedtime.   ipratropium-albuterol (DUONEB) 0.5-2.5 (3) MG/3ML SOLN, TAKE 3 ML BY NEBULIZATION EVERY 4 HOURS AS NEEDED (WHEEZING).   promethazine (PHENERGAN) 25 MG tablet, Take 1 tablet (25 mg total) by mouth every 8 (eight) hours as needed.   Tiotropium Bromide Monohydrate (SPIRIVA RESPIMAT) 2.5 MCG/ACT AERS, Inhale 2 puffs into the lungs daily.   Tiotropium Bromide Monohydrate (SPIRIVA RESPIMAT) 2.5 MCG/ACT AERS, Inhale 2 puffs into the lungs in the morning and at bedtime.  Current Outpatient Medications (Analgesics):    acetaminophen (TYLENOL) 500 MG tablet, Take 500-1,000 mg by mouth every 6 (six) hours as needed for mild pain or headache.   HYDROcodone-acetaminophen (NORCO) 7.5-325 MG tablet, Take 1 tablet by mouth every 6 (six) hours as needed for moderate pain.   traMADol (ULTRAM) 50 MG tablet, Take 1 tablet (50 mg total) by mouth daily as needed.  Current Outpatient Medications (Hematological):    apixaban (ELIQUIS) 5 MG TABS tablet, Take 1 tablet (5 mg total) by mouth 2 (two) times daily.   cyanocobalamin (VITAMIN B12) 1000 MCG tablet, Take 1,000 mcg by mouth daily.  Current Outpatient Medications (Other):    ALPRAZolam (XANAX) 0.5 MG tablet, TAKE 2 TABLETS (1 MG TOTAL) BY MOUTH EVERY DAY AT BEDTIME   ammonium lactate (AMLACTIN) 12 % cream, Apply 1 Application topically as needed for dry skin.   augmented betamethasone dipropionate (DIPROLENE-AF) 0.05 % cream, Apply 1 application  topically daily as needed (to affected areas- for itching).   carisoprodol (SOMA) 350 MG tablet, TAKE 1 TABLET BY MOUTH THREE  TIMES A DAY AS NEEDED FOR MUSCLE SPASM   Cholecalciferol (VITAMIN D3) 1000 units CAPS, Take 1,000 Units by mouth daily.   clindamycin (CLEOCIN) 300 MG capsule, Take 1 capsule (300 mg total) by mouth 3 (three) times daily.   clotrimazole-betamethasone (LOTRISONE) cream, Apply 1 Application topically daily.   doxepin (SINEQUAN) 10 MG capsule, TAKE 2 CAPSULES BY MOUTH AT BEDTIME AS NEEDED.   DULoxetine (CYMBALTA) 60 MG capsule, TAKE 1 CAPSULE BY MOUTH EVERY DAY   Dupilumab (DUPIXENT) 300 MG/2ML SOPN, Inject 300 mg into the skin every 14 (fourteen) days.   erythromycin ophthalmic ointment, Place 1 Application into the right eye 3 (three) times daily.   fluconazole (DIFLUCAN) 100 MG tablet, Take 1 tablet (100 mg total) by mouth daily.   hydrOXYzine (ATARAX) 10 MG tablet, TAKE 1 TABLET BY MOUTH 3 TIMES DAILY AS NEEDED FOR ITCHING. Strength: 10 mg   ketoconazole (NIZORAL) 2 % cream, Apply 1 Application topically daily.   nystatin (MYCOSTATIN/NYSTOP) powder, Use as directed twice per day as needed to the affected area   omeprazole (PRILOSEC) 20 MG capsule, TAKE 1 CAPSULE BY MOUTH TWICE A DAY (Patient taking differently: Take 20 mg by mouth daily.)   ondansetron (ZOFRAN) 4 MG tablet, Take 1 tablet (4 mg total) by mouth every 6 (six) hours as needed for nausea.   ondansetron (ZOFRAN-ODT) 4 MG disintegrating tablet, Take by mouth.   potassium chloride SA (KLOR-CON M20) 20 MEQ tablet, TAKE (2 TABLETS) IN THE MORNING   Respiratory Therapy Supplies (FULL KIT NEBULIZER SET) MISC, 1 each by  Does not apply route as needed.   silver sulfADIAZINE (SILVADENE) 1 % cream, APPLY TOPICALLY TO AFFECTED AREA EVERY DAY   tamsulosin (FLOMAX) 0.4 MG CAPS capsule, TAKE 1 CAPSULE BY MOUTH EVERY DAY   traZODone (DESYREL) 100 MG tablet, TAKE 2 TABLETS BY MOUTH AT BEDTIME AS NEEDED   TUMS 500 MG chewable tablet, Chew 500-2,000 mg by mouth every 4 (four) hours as needed for indigestion or heartburn.   valACYclovir (VALTREX)  1000 MG tablet, TAKE 1 TABLET BY MOUTH THREE TIMES A DAY FOR 7 DAYS (Patient taking differently: As needed)   venlafaxine XR (EFFEXOR-XR) 150 MG 24 hr capsule, TAKE 1 CAPSULE BY MOUTH DAILY WITH BREAKFAST.   zolpidem (AMBIEN) 10 MG tablet, TAKE 1 TABLET BY MOUTH EVERY DAY AT BEDTIME AS NEEDED   Reviewed prior external information including notes and imaging from  primary care provider As well as notes that were available from care everywhere and other healthcare systems.  Past medical history, social, surgical and family history all reviewed in electronic medical record.  No pertanent information unless stated regarding to the chief complaint.   Review of Systems:  No headache, visual changes, nausea, vomiting, diarrhea, constipation, dizziness, abdominal pain, skin rash, fevers, chills, night sweats, weight loss, swollen lymph nodes, body aches, joint swelling, chest pain, shortness of breath, mood changes. POSITIVE muscle aches  Objective  Blood pressure 110/82, pulse 71, height 5\' 10"  (1.778 m), SpO2 96%.   General: No apparent distress alert and oriented x3 mood and affect normal, dressed appropriately.  HEENT: Pupils equal, extraocular movements intact  Respiratory: Patient's speak in full sentences and does not appear short of breath  Cardiovascular: No lower extremity edema, non tender, no erythema  Neck exam shows patient does have some tenderness to palpation noted bilaterally.  Keeps his head with a right-sided sidebending and left-sided rotation.  Neurovascularly intact though.  Shoulders bilaterally do have some crepitus noted.  Some tenderness to palpation noted.   Procedure: Real-time Ultrasound Guided Injection of right glenohumeral joint Device: GE Logiq Q7  Ultrasound guided injection is preferred based studies that show increased duration, increased effect, greater accuracy, decreased procedural pain, increased response rate with ultrasound guided versus blind injection.   Verbal informed consent obtained.  Time-out conducted.  Noted no overlying erythema, induration, or other signs of local infection.  Skin prepped in a sterile fashion.  Local anesthesia: Topical Ethyl chloride.  With sterile technique and under real time ultrasound guidance:  Joint visualized.  23g 1  inch needle inserted posterior approach. Pictures taken for needle placement. Patient did have injection of 2 cc of 1% lidocaine, 2 cc of 0.5% Marcaine, and 1.0 cc of Kenalog 40 mg/dL. Completed without difficulty  Pain immediately resolved suggesting accurate placement of the medication.  Advised to call if fevers/chills, erythema, induration, drainage, or persistent bleeding.  Impression: Technically successful ultrasound guided injection.  Procedure: Real-time Ultrasound Guided Injection of left glenohumeral joint Device: GE Logiq E  Ultrasound guided injection is preferred based studies that show increased duration, increased effect, greater accuracy, decreased procedural pain, increased response rate with ultrasound guided versus blind injection.  Verbal informed consent obtained.  Time-out conducted.  Noted no overlying erythema, induration, or other signs of local infection.  Skin prepped in a sterile fashion.  Local anesthesia: Topical Ethyl chloride.  With sterile technique and under real time ultrasound guidance:  Joint visualized.  21g 2 inch needle inserted posterior approach. Pictures taken for needle placement. Patient did have injection of  2 cc of 0.5% Marcaine, and 1cc of Kenalog 40 mg/dL. Completed without difficulty  Pain immediately resolved suggesting accurate placement of the medication.  Advised to call if fevers/chills, erythema, induration, drainage, or persistent bleeding.  Impression: Technically successful ultrasound guided injection.    Impression and Recommendations:     The above documentation has been reviewed and is accurate and complete Judi Saa,  DO

## 2023-07-26 ENCOUNTER — Telehealth: Payer: Self-pay

## 2023-07-26 NOTE — Telephone Encounter (Signed)
Remote ICM transmission received.  Attempted call to patient regarding ICM remote transmission and left detailed message per DPR.  Left ICM phone number and advised to return call for any fluid symptoms or questions. Next ICM remote transmission scheduled 08/12/2023.

## 2023-07-26 NOTE — Progress Notes (Signed)
EPIC Encounter for ICM Monitoring  Patient Name: [name] is a 67 y.o. male Date: 07/26/2023 Primary Care Physican: [name], MD Primary Cardiologist: Shirlee Latch Electrophysiologist: Joycelyn Schmid Pacing: 99.8%    07/09/2023 Weight: 280 lbs     Attempted call to patient and unable to reach.  Left detailed message per DPR regarding transmission. Transmission reviewed.    Optivol thoracic impedance suggesting fluid levels returned to baseline after taking Torsemide 100 mg bid x 3 days (9/26).  Fluid returned on 10/3 and trending back close to baseline.     Prescribed:  Torsemide 20 mg 4 tablet(s) (80 mg total) by mouth twice day.    Confirmed 9/24 that he is taking as prescribed Potassium 20 mEq take 2 tablets (40 mEq total) daily   Labs: 07/07/2023 BMET scheduled with Dr Raphael Gibney office to recheck potassium  07/01/2023 Creatinine 1.09, BUN 20, Potassium 5.7, Sodium 142, GFR 69.72 (Potassium addressed by PCP, held K+ x 5 days and restarted at 40 mEq daily) 06/19/2023 Creatinine 1.38, BUN 21, Potassium 5.0, Sodium 134 05/15/2023 Creatinine 0.99, BUN 29, Potassium 3.8, Sodium 138, GFR >60 05/14/2023 Creatinine 1.23, BUN 37, Potassium 4.2, Sodium 138, GFR >60 03/22/2023 Creatinine 1.16, BUN 20, Potassium 4.0, Sodium 138, GFR >60 02/18/2023 Creatinine 1.20, BUN 23, Potassium 4.4, Sodium 135, GFR >60 01/25/2023 Creatinine 1.22, BUN 37, Potassium 3.6, Sodium 138, GFR >60 01/15/2023 Creatinine 1.38, BUN 22, Potassium 4.6, Sodium 134, GFR 52.70  12/31/2022 Creatinine 1.27, BUN 36, Potassium 5.2, Sodium 136, GFR 58.24  12/18/2022 Creatinine 1.13, BUN 36, Potassium 4.5, Sodium 135  11/24/2022 Creatinine 1.45, BUN 45, Potassium 3.2, Sodium 135, GFR 53 (12:09 PM) 11/24/2022 Creatinine 1.59, BUN 51, Potassium 4.0, Sodium 138, GFR 47 (6:01 AM) A complete set of results can be found in Results Review.   Recommendations:   Left voice mail with ICM number and encouraged to call if experiencing any  fluid symptoms.   Follow-up plan: ICM clinic phone appointment on 08/12/2023.   91 day device clinic remote transmission 10/23/2023.   EP/Cardiology Office Visits:    Recall 11/28/2023 with Dr Graciela Husbands.  09/04/2023 with Dr Shirlee Latch.   Copy of ICM check sent to Dr. Graciela Husbands.    3 month ICM trend: 07/24/2023.    12-14 Month ICM trend:     Karie Soda, RN 07/26/2023 10:14 AM

## 2023-07-30 ENCOUNTER — Ambulatory Visit (INDEPENDENT_AMBULATORY_CARE_PROVIDER_SITE_OTHER): Payer: Medicare HMO | Admitting: Family Medicine

## 2023-07-30 ENCOUNTER — Encounter: Payer: Self-pay | Admitting: Family Medicine

## 2023-07-30 ENCOUNTER — Other Ambulatory Visit: Payer: Self-pay

## 2023-07-30 VITALS — BP 110/82 | HR 71 | Ht 70.0 in

## 2023-07-30 DIAGNOSIS — G8929 Other chronic pain: Secondary | ICD-10-CM

## 2023-07-30 DIAGNOSIS — M25512 Pain in left shoulder: Secondary | ICD-10-CM | POA: Diagnosis not present

## 2023-07-30 DIAGNOSIS — M12811 Other specific arthropathies, not elsewhere classified, right shoulder: Secondary | ICD-10-CM | POA: Diagnosis not present

## 2023-07-30 DIAGNOSIS — M25511 Pain in right shoulder: Secondary | ICD-10-CM

## 2023-07-30 DIAGNOSIS — M12812 Other specific arthropathies, not elsewhere classified, left shoulder: Secondary | ICD-10-CM | POA: Diagnosis not present

## 2023-07-30 MED ORDER — PREDNISONE 10 MG PO TABS: 10.0000 mg | ORAL_TABLET | Freq: Every day | ORAL | 0 refills | Status: DC

## 2023-07-30 NOTE — Assessment & Plan Note (Signed)
Bilateral shoulders given injections today.  Can consider the possibility of acromioclavicular injections otherwise.  Discussed with patient about his neck and the possibility need for surgical intervention.  Awaiting an MRI.  Patient would like to wait for the MRI before he would follow-up with neurosurgery.  States that as long as he keeps his head in a bent position he seems to do relatively well.  Has not noticed any significant increase in weakness.  Discussed icing regimen and home exercises otherwise.  No change in medications.  Follow-up again in 3 months

## 2023-07-31 ENCOUNTER — Encounter (HOSPITAL_BASED_OUTPATIENT_CLINIC_OR_DEPARTMENT_OTHER): Payer: Medicare HMO | Admitting: General Surgery

## 2023-07-31 ENCOUNTER — Ambulatory Visit: Payer: Medicare HMO | Admitting: Physical Medicine & Rehabilitation

## 2023-07-31 DIAGNOSIS — L97812 Non-pressure chronic ulcer of other part of right lower leg with fat layer exposed: Secondary | ICD-10-CM | POA: Diagnosis not present

## 2023-07-31 DIAGNOSIS — I428 Other cardiomyopathies: Secondary | ICD-10-CM | POA: Diagnosis not present

## 2023-07-31 DIAGNOSIS — E1151 Type 2 diabetes mellitus with diabetic peripheral angiopathy without gangrene: Secondary | ICD-10-CM | POA: Diagnosis not present

## 2023-07-31 DIAGNOSIS — L97822 Non-pressure chronic ulcer of other part of left lower leg with fat layer exposed: Secondary | ICD-10-CM | POA: Diagnosis not present

## 2023-07-31 DIAGNOSIS — E11622 Type 2 diabetes mellitus with other skin ulcer: Secondary | ICD-10-CM | POA: Diagnosis not present

## 2023-07-31 DIAGNOSIS — D649 Anemia, unspecified: Secondary | ICD-10-CM | POA: Diagnosis not present

## 2023-07-31 DIAGNOSIS — E1140 Type 2 diabetes mellitus with diabetic neuropathy, unspecified: Secondary | ICD-10-CM | POA: Diagnosis not present

## 2023-07-31 DIAGNOSIS — E119 Type 2 diabetes mellitus without complications: Secondary | ICD-10-CM | POA: Diagnosis not present

## 2023-07-31 DIAGNOSIS — I5022 Chronic systolic (congestive) heart failure: Secondary | ICD-10-CM | POA: Diagnosis not present

## 2023-07-31 DIAGNOSIS — I890 Lymphedema, not elsewhere classified: Secondary | ICD-10-CM | POA: Diagnosis not present

## 2023-07-31 NOTE — Progress Notes (Signed)
Referring [name]: Treating [name]/Extender: [name] [place], [place] ([id number]) [id number] Page 7 of 9 Weeks in Treatment: 4 Wound Status Wound Number: 4 Primary Diabetic Wound/Ulcer of the Lower Extremity Etiology: Wound Location: Right Knee Wound Healed - Epithelialized Wounding Event: Trauma Status: Date  Acquired: 05/14/2023 Comorbid Cataracts, Glaucoma, Anemia, Asthma, Chronic Obstructive Weeks Of Treatment: 4 History: Pulmonary Disease (COPD), Sleep Apnea, Arrhythmia, Congestive Clustered Wound: No Heart Failure, Deep Vein Thrombosis, Myocardial Infarction, Peripheral Venous Disease, Type II Diabetes, Osteoarthritis, Neuropathy Photos Wound Measurements Length: (cm) Width: (cm) Depth: (cm) Area: (cm) Volume: (cm) 0 % Reduction in Area: 100% 0 % Reduction in Volume: 100% 0 Epithelialization: Medium (34-66%) 0 0 Wound Description Classification: Grade 1 Wound Margin: Distinct, outline attached Exudate Amount: Medium Exudate Type: Serosanguineous Exudate Color: red, brown Foul Odor After Cleansing: No Slough/Fibrino No Wound Bed Granulation Amount: Large (67-100%) Exposed Structure Granulation Quality: Red Fascia Exposed: No Necrotic Amount: Small (1-33%) Fat Layer (Subcutaneous Tissue) Exposed: Yes Necrotic Quality: Eschar Tendon Exposed: No Muscle Exposed: No Joint Exposed: No Bone Exposed: No Periwound Skin Texture Texture Color No Abnormalities Noted: Yes No Abnormalities Noted: Yes Moisture Temperature / Pain No Abnormalities Noted: Yes Temperature: No Abnormality Treatment Notes Wound #4 (Knee) Wound Laterality: Right Cleanser Peri-Wound Care Topical Primary Dressing Secondary Dressing Secured With Compression Wrap Compression Stockings Add-Ons [name], [name] ([id number]) [id number] Page 8 of 9 Electronic Signature(s) Signed: 07/31/2023 4:07:14 PM By: Brenton Grills Entered By: Brenton Grills on 07/31/2023 14:01:58 -------------------------------------------------------------------------------- Wound Assessment Details Patient Name: Date of Service: [name] RD L. 07/31/2023 1:45 PM Medical Record Number: [id number] Patient Account Number: [id number] Date of Birth/Sex: Treating RN: [birth year] (67 y.o. [name] Primary Care [name]: [name] Other Clinician: Referring [name]: Treating [name]/Extender: [name] in Treatment: 4 Wound Status Wound Number: 5 Primary Diabetic Wound/Ulcer of the Lower Extremity Etiology: Wound Location: Left, Distal, Medial Lower Leg Wound Healed - Epithelialized Wounding Event: Trauma Status: Date Acquired: 05/14/2023 Comorbid Cataracts, Glaucoma, Anemia, Asthma, Chronic Obstructive Weeks Of Treatment: 4 History: Pulmonary Disease (COPD), Sleep Apnea, Arrhythmia, Congestive Clustered Wound: No Heart Failure, Deep Vein Thrombosis, Myocardial Infarction, Peripheral Venous Disease, Type II Diabetes, Osteoarthritis, Neuropathy Photos Wound Measurements Length: (cm) Width: (cm) Depth: (cm) Area: (cm) Volume: (cm) 0 % Reduction in Area: 100% 0 % Reduction in Volume: 100% 0 Epithelialization: Medium (34-66%) 0 Tunneling: No 0 Undermining: No Wound Description Classification: Grade 1 Wound Margin: Flat and Intact Exudate Amount: Medium Exudate Type: Serosanguineous Exudate Color: red, brown Foul Odor After Cleansing: No Slough/Fibrino Yes Wound Bed Granulation Amount: Large (67-100%) Exposed Structure Granulation Quality: Red, Pink Fascia Exposed: No Necrotic Amount: Small (1-33%) Fat Layer (Subcutaneous Tissue) Exposed: Yes Necrotic Quality: Adherent Slough Tendon Exposed: No Muscle Exposed: No Joint Exposed: No Bone Exposed: No Periwound Skin Texture Texture Color No Abnormalities Noted: Yes No Abnormalities Noted: No Hemosiderin Staining: Yes Moisture Rubor: No [name], [name] ([id number]) [id number] Page 9 of 9 Rubor: No No Abnormalities Noted: Yes Temperature / Pain Temperature: No Abnormality Treatment Notes Wound #5 (Lower Leg) Wound Laterality: Left, Medial, Distal Cleanser Peri-Wound Care Topical Primary Dressing Secondary Dressing Secured With Compression  Wrap Compression Stockings Add-Ons Electronic Signature(s) Signed: 07/31/2023 4:07:14 PM By: Brenton Grills Entered By: Brenton Grills on 07/31/2023 14:02:45 -------------------------------------------------------------------------------- Vitals Details Patient Name: Date of Service: [name] RD L. 07/31/2023 1:45 PM Medical Record Number: [id number] Patient Account Number: [id number] Date of Birth/Sex: Treating RN: [birth year] (67 y.o. M) Primary  0 Complex Wound Cleansing - multiple wounds X- 1 5 Wound Imaging (photographs - any number of wounds) []  - 0 Wound Tracing (instead of photographs) X- 1 5 Simple Wound Measurement - one wound []  - 0 Complex Wound Measurement - multiple wounds INTERVENTIONS - Wound Dressings []  - 0 Small Wound Dressing one or multiple wounds []  - 0 Medium Wound Dressing one or multiple wounds []  - 0 Large Wound Dressing one or multiple wounds X- 1 5 Application of Medications - topical []  - 0 Application of Medications - injection INTERVENTIONS - Miscellaneous []  - 0 External ear exam []  - 0 Specimen Collection (cultures, biopsies, blood, body fluids, etc.) []  - 0 Specimen(s) / Culture(s) sent or taken to Lab for analysis []  - 0 Patient Transfer (multiple staff / Nurse, adult / Similar devices) []  - 0 Simple Staple / Suture removal (25 or less) []  - 0 Complex Staple / Suture removal (26 or more) []  - 0 Hypo / Hyperglycemic Management (close monitor of Blood Glucose) []  - 0 Ankle / Brachial Index (ABI) - do not check if billed separately [name], [name] ([id number]) [id number] Page 3 of 9 X- 1 5 Vital Signs Has the patient been seen at the hospital within the last three years: Yes Total Score: 75 Level Of Care: New/Established - Level 2 Electronic Signature(s) Signed: 07/31/2023 4:07:14 PM By: Brenton Grills Entered By: Brenton Grills on 07/31/2023 14:05:31 -------------------------------------------------------------------------------- Encounter Discharge Information Details Patient Name: Date of Service: [name] RD L. 07/31/2023 1:45 PM Medical Record Number: [id number] Patient Account Number: [id number] Date of Birth/Sex: Treating RN: Nov 28, 1955 (67 y.o. [name] Primary Care Kimm Ungaro: [name] Other Clinician: Referring Tephanie Escorcia: Treating Ardythe Klute/Extender: [name] in Treatment: 4 Encounter Discharge Information Items Discharge Condition: Stable Ambulatory Status: Cane Discharge Destination: Home Transportation: Private Auto Accompanied By: self Schedule Follow-up Appointment: Yes Clinical Summary of Care: Patient Declined Electronic Signature(s) Signed: 07/31/2023 4:07:14 PM By: Brenton Grills Entered By: Brenton Grills on 07/31/2023 14:08:29 -------------------------------------------------------------------------------- Lower Extremity Assessment Details Patient Name: Date of Service: [name] RD L. 07/31/2023 1:45 PM Medical Record Number: [id number] Patient Account Number: [id number] Date of Birth/Sex: Treating RN: [birth year] (67 y.o. [name] Primary Care Cynethia Schindler: [name] Other Clinician: Referring Lacrystal Barbe: Treating Nykia Turko/Extender: [name] in Treatment: 4 Edema Assessment Assessed: [name]: No] [name]: No] [Left: Edema] [Right: :] Calf Left: Right: Point of Measurement: From Medial Instep 34 cm Ankle Left: Right: Point of Measurement: From Medial Instep 22 cm Vascular Assessment Pulses: [name], [name] ([id number]) [Right:[id number] Page 4 of 9] Dorsalis Pedis Palpable: [Right:Yes] Extremity colors, hair growth, and conditions: Extremity Color: [Right:Hyperpigmented] Hair Growth on Extremity: [Right:No] Temperature of Extremity: [Right:Warm] Capillary Refill: [Right:< 3 seconds] Dependent Rubor: [Right:No No] Electronic Signature(s) Signed: 07/31/2023 4:07:14 PM By: Brenton Grills Entered By: Brenton Grills on 07/31/2023 13:50:44 -------------------------------------------------------------------------------- Multi Wound Chart Details Patient Name: Date of Service: [name] RD L. 07/31/2023 1:45  PM Medical Record Number: [id number] Patient Account Number: [id number] Date of Birth/Sex: Treating RN: [birth year] (67 y.o. M) Primary Care Thimothy Barretta: [name] Other Clinician: Referring Donyea Beverlin: Treating Kaynen Minner/Extender: [name] in Treatment: 4 Vital Signs Height(in): 70 Pulse(bpm): 70 Weight(lbs): 280 Blood Pressure(mmHg): 102/70 Body Mass Index(BMI): 40.2 Temperature(F): 98.0 Respiratory Rate(breaths/min): 20 [4:Photos:] [N/A:N/A] Right Knee Left, Distal, Medial Lower Leg N/A Wound Location: Trauma Trauma N/A Wounding Event: Diabetic Wound/Ulcer of the Lower Diabetic Wound/Ulcer of the Lower N/A Primary Etiology: Extremity Extremity  Referring [name]: Treating [name]/Extender: [name] [place], [place] ([id number]) [id number] Page 7 of 9 Weeks in Treatment: 4 Wound Status Wound Number: 4 Primary Diabetic Wound/Ulcer of the Lower Extremity Etiology: Wound Location: Right Knee Wound Healed - Epithelialized Wounding Event: Trauma Status: Date  Acquired: 05/14/2023 Comorbid Cataracts, Glaucoma, Anemia, Asthma, Chronic Obstructive Weeks Of Treatment: 4 History: Pulmonary Disease (COPD), Sleep Apnea, Arrhythmia, Congestive Clustered Wound: No Heart Failure, Deep Vein Thrombosis, Myocardial Infarction, Peripheral Venous Disease, Type II Diabetes, Osteoarthritis, Neuropathy Photos Wound Measurements Length: (cm) Width: (cm) Depth: (cm) Area: (cm) Volume: (cm) 0 % Reduction in Area: 100% 0 % Reduction in Volume: 100% 0 Epithelialization: Medium (34-66%) 0 0 Wound Description Classification: Grade 1 Wound Margin: Distinct, outline attached Exudate Amount: Medium Exudate Type: Serosanguineous Exudate Color: red, brown Foul Odor After Cleansing: No Slough/Fibrino No Wound Bed Granulation Amount: Large (67-100%) Exposed Structure Granulation Quality: Red Fascia Exposed: No Necrotic Amount: Small (1-33%) Fat Layer (Subcutaneous Tissue) Exposed: Yes Necrotic Quality: Eschar Tendon Exposed: No Muscle Exposed: No Joint Exposed: No Bone Exposed: No Periwound Skin Texture Texture Color No Abnormalities Noted: Yes No Abnormalities Noted: Yes Moisture Temperature / Pain No Abnormalities Noted: Yes Temperature: No Abnormality Treatment Notes Wound #4 (Knee) Wound Laterality: Right Cleanser Peri-Wound Care Topical Primary Dressing Secondary Dressing Secured With Compression Wrap Compression Stockings Add-Ons [name], [name] ([id number]) [id number] Page 8 of 9 Electronic Signature(s) Signed: 07/31/2023 4:07:14 PM By: Brenton Grills Entered By: Brenton Grills on 07/31/2023 14:01:58 -------------------------------------------------------------------------------- Wound Assessment Details Patient Name: Date of Service: [name] RD L. 07/31/2023 1:45 PM Medical Record Number: [id number] Patient Account Number: [id number] Date of Birth/Sex: Treating RN: [birth year] (67 y.o. [name] Primary Care [name]: [name] Other Clinician: Referring [name]: Treating [name]/Extender: [name] in Treatment: 4 Wound Status Wound Number: 5 Primary Diabetic Wound/Ulcer of the Lower Extremity Etiology: Wound Location: Left, Distal, Medial Lower Leg Wound Healed - Epithelialized Wounding Event: Trauma Status: Date Acquired: 05/14/2023 Comorbid Cataracts, Glaucoma, Anemia, Asthma, Chronic Obstructive Weeks Of Treatment: 4 History: Pulmonary Disease (COPD), Sleep Apnea, Arrhythmia, Congestive Clustered Wound: No Heart Failure, Deep Vein Thrombosis, Myocardial Infarction, Peripheral Venous Disease, Type II Diabetes, Osteoarthritis, Neuropathy Photos Wound Measurements Length: (cm) Width: (cm) Depth: (cm) Area: (cm) Volume: (cm) 0 % Reduction in Area: 100% 0 % Reduction in Volume: 100% 0 Epithelialization: Medium (34-66%) 0 Tunneling: No 0 Undermining: No Wound Description Classification: Grade 1 Wound Margin: Flat and Intact Exudate Amount: Medium Exudate Type: Serosanguineous Exudate Color: red, brown Foul Odor After Cleansing: No Slough/Fibrino Yes Wound Bed Granulation Amount: Large (67-100%) Exposed Structure Granulation Quality: Red, Pink Fascia Exposed: No Necrotic Amount: Small (1-33%) Fat Layer (Subcutaneous Tissue) Exposed: Yes Necrotic Quality: Adherent Slough Tendon Exposed: No Muscle Exposed: No Joint Exposed: No Bone Exposed: No Periwound Skin Texture Texture Color No Abnormalities Noted: Yes No Abnormalities Noted: No Hemosiderin Staining: Yes Moisture Rubor: No [name], [name] ([id number]) [id number] Page 9 of 9 Rubor: No No Abnormalities Noted: Yes Temperature / Pain Temperature: No Abnormality Treatment Notes Wound #5 (Lower Leg) Wound Laterality: Left, Medial, Distal Cleanser Peri-Wound Care Topical Primary Dressing Secondary Dressing Secured With Compression  Wrap Compression Stockings Add-Ons Electronic Signature(s) Signed: 07/31/2023 4:07:14 PM By: Brenton Grills Entered By: Brenton Grills on 07/31/2023 14:02:45 -------------------------------------------------------------------------------- Vitals Details Patient Name: Date of Service: [name] RD L. 07/31/2023 1:45 PM Medical Record Number: [id number] Patient Account Number: [id number] Date of Birth/Sex: Treating RN: [birth year] (67 y.o. M) Primary  0 Complex Wound Cleansing - multiple wounds X- 1 5 Wound Imaging (photographs - any number of wounds) []  - 0 Wound Tracing (instead of photographs) X- 1 5 Simple Wound Measurement - one wound []  - 0 Complex Wound Measurement - multiple wounds INTERVENTIONS - Wound Dressings []  - 0 Small Wound Dressing one or multiple wounds []  - 0 Medium Wound Dressing one or multiple wounds []  - 0 Large Wound Dressing one or multiple wounds X- 1 5 Application of Medications - topical []  - 0 Application of Medications - injection INTERVENTIONS - Miscellaneous []  - 0 External ear exam []  - 0 Specimen Collection (cultures, biopsies, blood, body fluids, etc.) []  - 0 Specimen(s) / Culture(s) sent or taken to Lab for analysis []  - 0 Patient Transfer (multiple staff / Nurse, adult / Similar devices) []  - 0 Simple Staple / Suture removal (25 or less) []  - 0 Complex Staple / Suture removal (26 or more) []  - 0 Hypo / Hyperglycemic Management (close monitor of Blood Glucose) []  - 0 Ankle / Brachial Index (ABI) - do not check if billed separately [name], [name] ([id number]) [id number] Page 3 of 9 X- 1 5 Vital Signs Has the patient been seen at the hospital within the last three years: Yes Total Score: 75 Level Of Care: New/Established - Level 2 Electronic Signature(s) Signed: 07/31/2023 4:07:14 PM By: Brenton Grills Entered By: Brenton Grills on 07/31/2023 14:05:31 -------------------------------------------------------------------------------- Encounter Discharge Information Details Patient Name: Date of Service: [name] RD L. 07/31/2023 1:45 PM Medical Record Number: [id number] Patient Account Number: [id number] Date of Birth/Sex: Treating RN: Nov 28, 1955 (67 y.o. [name] Primary Care Kimm Ungaro: [name] Other Clinician: Referring Tephanie Escorcia: Treating Ardythe Klute/Extender: [name] in Treatment: 4 Encounter Discharge Information Items Discharge Condition: Stable Ambulatory Status: Cane Discharge Destination: Home Transportation: Private Auto Accompanied By: self Schedule Follow-up Appointment: Yes Clinical Summary of Care: Patient Declined Electronic Signature(s) Signed: 07/31/2023 4:07:14 PM By: Brenton Grills Entered By: Brenton Grills on 07/31/2023 14:08:29 -------------------------------------------------------------------------------- Lower Extremity Assessment Details Patient Name: Date of Service: [name] RD L. 07/31/2023 1:45 PM Medical Record Number: [id number] Patient Account Number: [id number] Date of Birth/Sex: Treating RN: [birth year] (67 y.o. [name] Primary Care Cynethia Schindler: [name] Other Clinician: Referring Lacrystal Barbe: Treating Nykia Turko/Extender: [name] in Treatment: 4 Edema Assessment Assessed: [name]: No] [name]: No] [Left: Edema] [Right: :] Calf Left: Right: Point of Measurement: From Medial Instep 34 cm Ankle Left: Right: Point of Measurement: From Medial Instep 22 cm Vascular Assessment Pulses: [name], [name] ([id number]) [Right:[id number] Page 4 of 9] Dorsalis Pedis Palpable: [Right:Yes] Extremity colors, hair growth, and conditions: Extremity Color: [Right:Hyperpigmented] Hair Growth on Extremity: [Right:No] Temperature of Extremity: [Right:Warm] Capillary Refill: [Right:< 3 seconds] Dependent Rubor: [Right:No No] Electronic Signature(s) Signed: 07/31/2023 4:07:14 PM By: Brenton Grills Entered By: Brenton Grills on 07/31/2023 13:50:44 -------------------------------------------------------------------------------- Multi Wound Chart Details Patient Name: Date of Service: [name] RD L. 07/31/2023 1:45  PM Medical Record Number: [id number] Patient Account Number: [id number] Date of Birth/Sex: Treating RN: [birth year] (67 y.o. M) Primary Care Thimothy Barretta: [name] Other Clinician: Referring Donyea Beverlin: Treating Kaynen Minner/Extender: [name] in Treatment: 4 Vital Signs Height(in): 70 Pulse(bpm): 70 Weight(lbs): 280 Blood Pressure(mmHg): 102/70 Body Mass Index(BMI): 40.2 Temperature(F): 98.0 Respiratory Rate(breaths/min): 20 [4:Photos:] [N/A:N/A] Right Knee Left, Distal, Medial Lower Leg N/A Wound Location: Trauma Trauma N/A Wounding Event: Diabetic Wound/Ulcer of the Lower Diabetic Wound/Ulcer of the Lower N/A Primary Etiology: Extremity Extremity  [name], [name] ([id number]) [id number] Page 1 of 9 Visit Report for 07/31/2023 Arrival Information Details Patient Name: Date of Service: [name] RD L. 07/31/2023 1:45 PM Medical Record Number: [id number] Patient Account Number: [id number] Date of Birth/Sex: Treating RN: 30-Jun-1956 (67 y.o. M) Primary Care Kameran Mcneese: [name] Other Clinician: Referring Toshua Honsinger: Treating Jaiyana Canale/Extender: [name] in Treatment: 4 Visit Information History Since Last Visit Added or deleted any medications: No Patient Arrived: [place] Any new allergies or adverse reactions: No Arrival Time: 13:42 Had a fall or experienced change in No Accompanied By: wife activities of daily living that may affect Transfer Assistance: None risk of falls: Patient Identification Verified: Yes Signs or symptoms of abuse/neglect since last visito No Secondary Verification Process Completed: Yes Hospitalized since last visit: No Patient Has Alerts: Yes Implantable device outside of the clinic excluding No Patient Alerts: ABIs: both [place] 10/22/2022 cellular tissue based products placed in the center TBIs: R: 0.92 L:1.16 1/24 since last visit: Pain Present Now: No Electronic Signature(s) Signed: 07/31/2023 2:31:52 PM By: Dayton Scrape Entered By: Dayton Scrape on 07/31/2023 13:42:44 -------------------------------------------------------------------------------- Clinic Level of Care Assessment Details Patient Name: Date of Service: [name] RD L. 07/31/2023 1:45 PM Medical Record Number: [id number] Patient Account Number: [id number] Date of Birth/Sex: Treating RN: 09/26/1956 (67 y.o. [name] Primary Care Lakesha Levinson: [name] Other Clinician: Referring Lillybeth Tal: Treating Nanna Ertle/Extender: [name] in Treatment: 4 Clinic Level of Care Assessment Items TOOL 4 Quantity Score X- 1 0 Use when only an EandM is performed on  FOLLOW-UP visit ASSESSMENTS - Nursing Assessment / Reassessment X- 1 10 Reassessment of Co-morbidities (includes updates in patient status) X- 1 5 Reassessment of Adherence to Treatment Plan ASSESSMENTS - Wound and Skin A ssessment / Reassessment X - Simple Wound Assessment / Reassessment - one wound 1 5 X- 1 5 Complex Wound Assessment / Reassessment - multiple wounds X- 1 10 Dermatologic / Skin Assessment (not related to wound area) ASSESSMENTS - Focused Assessment []  - 0 Circumferential Edema Measurements - multi extremities []  - 0 Nutritional Assessment / Counseling / Intervention []  - 0 Lower Extremity Assessment (monofilament, tuning fork, pulses) [name], [name] ([id number]) [id number] Page 2 of 9 []  - 0 Peripheral Arterial Disease Assessment (using hand held doppler) ASSESSMENTS - Ostomy and/or Continence Assessment and Care []  - 0 Incontinence Assessment and Management []  - 0 Ostomy Care Assessment and Management (repouching, etc.) PROCESS - Coordination of Care []  - 0 Simple Patient / Family Education for ongoing care []  - 0 Complex (extensive) Patient / Family Education for ongoing care X- 1 10 Staff obtains Chiropractor, Records, T Results / Process Orders est []  - 0 Staff telephones HHA, Nursing Homes / Clarify orders / etc []  - 0 Routine Transfer to another Facility (non-emergent condition) []  - 0 Routine Hospital Admission (non-emergent condition) []  - 0 New Admissions / Manufacturing engineer / Ordering NPWT Apligraf, etc. , []  - 0 Emergency Hospital Admission (emergent condition) X- 1 10 Simple Discharge Coordination []  - 0 Complex (extensive) Discharge Coordination PROCESS - Special Needs []  - 0 Pediatric / Minor Patient Management []  - 0 Isolation Patient Management []  - 0 Hearing / Language / Visual special needs []  - 0 Assessment of Community assistance (transportation, D/C planning, etc.) []  - 0 Additional  assistance / Altered mentation []  - 0 Support Surface(s) Assessment (bed, cushion, seat, etc.) INTERVENTIONS - Wound Cleansing / Measurement []  - 0 Simple Wound Cleansing - one wound []  -

## 2023-07-31 NOTE — Progress Notes (Signed)
periwound skin appearance had no abnormalities noted for texture. The periwound skin appearance had no abnormalities noted for moisture. The periwound skin appearance had no abnormalities noted for color. Periwound temperature was noted as No Abnormality. Wound #5 status is Healed - Epithelialized. Original cause of wound was Trauma. The date acquired was: 05/14/2023. The wound has been in treatment 4 weeks. The wound is located on the Left,Distal,Medial Lower Leg. The wound measures 0cm length x 0cm width x 0cm depth; 0cm^2 area and 0cm^3 volume. There is Fat Layer (Subcutaneous Tissue) exposed. There is no tunneling or undermining noted. There is a medium amount of serosanguineous drainage noted. The wound margin is flat and intact. There is large (67-100%) red, pink granulation within the wound bed. There is a small (1-33%) amount of necrotic tissue within the wound bed including Adherent Slough. The periwound  skin appearance had no abnormalities noted for texture. The periwound skin appearance had no abnormalities noted for moisture. The periwound skin appearance exhibited: Hemosiderin Staining. The periwound skin appearance did not exhibit: Rubor. Periwound temperature was noted as No Abnormality. Assessment Active Problems ICD-10 Non-pressure chronic ulcer of other part of left lower leg with fat layer exposed Non-pressure chronic ulcer of other part of right lower leg with fat layer exposed Type 2 diabetes mellitus with other skin ulcer Unspecified systolic (congestive) heart failure Plan Discharge From [facility] Services: Discharge from Wound Care Center - Congratulations on healing!!!! Bathing/ Shower/ Hygiene: May shower and wash wound with soap and water. Edema Control - Lymphedema / SCD / Other: Elevate legs to the level of the heart or above for 30 minutes daily and/or when sitting for 3-4 times a day throughout the day. Avoid standing for long periods of time. Patient to wear own compression stockings every day. - right leg daily Exercise regularly Moisturize legs daily. [name], [name] ([id number]) [id number] Page 7 of 9 07/31/2023: His wounds are healed. I have recommended that he wear compression stockings on a daily basis and try to keep his legs elevated is much as throughout the day and at night while he is sleeping. We will discharge him from the wound care center. He may follow-up in the future should the need arise. Electronic Signature(s) Signed: 07/31/2023 2:07:33 PM By: Duanne Guess MD FACS Entered By: Duanne Guess on 07/31/2023 14:07:33 -------------------------------------------------------------------------------- HxROS Details Patient Name: Date of Service: [name], [name] RD L. 07/31/2023 1:45 PM Medical Record Number: [id number] Patient Account Number: [id number] Date of Birth/Sex: Treating RN: [birth year] (67 y.o. M) Primary Care  Provider: [name] Other Clinician: Referring Provider: Treating Provider/Extender: Joeseph Amor in Treatment: 4 Information Obtained From Patient Chart Constitutional Symptoms (General Health) Medical History: Past Medical History Notes: obesity Eyes Medical History: Positive for: Cataracts; Glaucoma Negative for: Optic Neuritis Hematologic/Lymphatic Medical History: Positive for: Anemia Respiratory Medical History: Positive for: Asthma; Chronic Obstructive Pulmonary Disease (COPD); Sleep Apnea - sleeps in recliner Negative for: Pneumothorax Past Medical History Notes: O2 dependent Cardiovascular Medical History: Positive for: Arrhythmia - afib; Congestive Heart Failure; Deep Vein Thrombosis; Myocardial Infarction; Peripheral Venous Disease Past Medical History Notes: cardiomyopathy, pacemaker/defibulator Gastrointestinal Medical History: Past Medical History Notes: GERD Endocrine Medical History: Positive for: Type II Diabetes Negative for: Type I Diabetes Past Medical History Notes: thyrotoxicosis Time with diabetes: 10 yrs Treated with: Diet Blood sugar tested every day: No [name], [name] ([id number]) [id number] Page 8 of 9 Genitourinary Medical History: Negative for: End Stage Renal Disease Past Medical History Notes: hx kidney stones,  BPH Integumentary (Skin) Medical History: Negative for: History of Burn Musculoskeletal Medical History: Positive for: Osteoarthritis Past Medical History Notes: thoracic and lumbosacral spondylosis, fibromyalgia, Right lumbar radiculopathy, De Quervain's tenosynovitis - right Neurologic Medical History: Positive for: Neuropathy Oncologic Medical History: Negative for: Received Chemotherapy; Received Radiation Psychiatric Medical History: Negative for: Anorexia/bulimia; Confinement Anxiety Past Medical History Notes: depression, anxiety HBO Extended History  Items Eyes: Eyes: Cataracts Glaucoma Immunizations Pneumococcal Vaccine: Received Pneumococcal Vaccination: Yes Received Pneumococcal Vaccination On or After 60th Birthday: Yes Implantable Devices Yes Family and Social History Cancer: No; Diabetes: No; Heart Disease: Yes - Father; Hereditary Spherocytosis: No; Hypertension: Yes - Mother,Father,Siblings; Kidney Disease: No; Lung Disease: Yes - Mother; Seizures: No; Stroke: No; Thyroid Problems: Yes - Mother; Tuberculosis: No; Former smoker - quit 2009; Marital Status - Divorced; Alcohol Use: Never; Drug Use: No History; Caffeine Use: Daily - coffee, diet soda; Financial Concerns: No; Food, Clothing or Shelter Needs: No; Support System Lacking: No; Transportation Concerns: No Electronic Signature(s) Signed: 07/31/2023 2:09:47 PM By: Duanne Guess MD FACS Entered By: Duanne Guess on 07/31/2023 14:06:03 -------------------------------------------------------------------------------- SuperBill Details Patient Name: Date of Service: [name] RD L. 07/31/2023 Medical Record Number: [id number] Patient Account Number: [id number] Date of Birth/Sex: Treating RN: 1955-10-18 (67 y.o. Yates Decamp Primary Care Provider: [name] Other Clinician: Referring Provider: Treating Provider/Extender: Joeseph Amor in Treatment: [address] [place] ([id number]) [id number] Page 9 of 9 Diagnosis Coding ICD-10 Codes Code Description [phone] Non-pressure chronic ulcer of other part of left lower leg with fat layer exposed L97.812 Non-pressure chronic ulcer of other part of right lower leg with fat layer exposed E11.622 Type 2 diabetes mellitus with other skin ulcer I50.20 Unspecified systolic (congestive) heart failure Facility Procedures : 7 CPT4 Code: [id number] Description: 78469 - WOUND CARE VISIT-LEV 2 EST PT Modifier: 25 Quantity: 1 Physician Procedures : CPT4 Code Description  Modifier [id number] - WC PHYS LEVEL 3 - EST PT ICD-10 Diagnosis Description L97.822 Non-pressure chronic ulcer of other part of left lower leg with fat layer exposed L97.812 Non-pressure chronic ulcer of other part of right  lower leg with fat layer exposed E11.622 Type 2 diabetes mellitus with other skin ulcer I50.20 Unspecified systolic (congestive) heart failure Quantity: 1 Electronic Signature(s) Signed: 07/31/2023 2:07:49 PM By: Duanne Guess MD FACS Entered By: Duanne Guess on 07/31/2023 14:07:49  [name], [name] ([id number]) [id number] Page 1 of 9 Visit Report for 07/31/2023 Chief Complaint Document Details Patient Name: Date of Service: [name] RD L. 07/31/2023 1:45 PM Medical Record Number: [id number] Patient Account Number: [id number] Date of Birth/Sex: Treating RN: [birth year] (67 y.o. M) Primary Care Provider: [name] Other Clinician: Referring Provider: Treating Provider/Extender: Joeseph Amor in Treatment: 4 Information Obtained from: Patient Chief Complaint 05/24/2022; right lower extremity wound 07/02/2023: returns with BLE wounds Electronic Signature(s) Signed: 07/31/2023 2:05:22 PM By: Duanne Guess MD FACS Entered By: Duanne Guess on 07/31/2023 14:05:22 -------------------------------------------------------------------------------- HPI Details Patient Name: Date of Service: [name] RD L. 07/31/2023 1:45 PM Medical Record Number: [id number] Patient Account Number: [id number] Date of Birth/Sex: Treating RN: [birth year] (67 y.o. M) Primary Care Provider: [name] Other Clinician: Referring Provider: Treating Provider/Extender: Joeseph Amor in Treatment: 4 History of Present Illness HPI Description: Admission 5/2 [name] is a 67 year old male with a past medical history of systolic congestive heart failure, COPD, nonischemic cardiomyopathy, type 2 diabetes, chronic venous insufficiency that presents to the clinic for a 47-month history of nonhealing wound to his left lower extremity. He states that in November 2021 he fell off his porch and Hit his leg against a brick. The wound has healed mostly except for 1 area that is still open. He reports moderate Serosanguineous drainage daily. He has been keeping it covered with Band-Aids. He reports 2 rounds of antibiotics for this issue. He denies pain. He denies current acute signs of infection. 5/9; patient presents for 1 week  follow-up. He has been using Hydrofera [name] under 3 layer compression. He has no issues or complaints today. He denies signs of infection. 5/16; patient presents for 1 week follow-up. He has been using Hydrofera [name] under 3 layer compression. He has no issues or complaints today. He denies signs of infection. He states he overall feels well 5/23; patient presents for 1 week follow-up. He has been using Hydrofera [name] under 3 layer compression. He has no issues or complaints today. He denies signs of infection. 6/6; patient presents for 2-week follow-up. He has been using Hydrofera [name] under 3 layer compression. He has had no issues with this. He denies any signs of infection and has no complaints today. Readmissiondifferent wound 6/27 patient developed another wound to his right lower extremity. He states that 1 week ago he had a large admission for a different wound Paining that was hung over his couch fell on his leg. He has been using alcohol and keeping the area covered. He denies signs of infection. 7/5; patient presents for 1 week follow-up. He has been tolerating Hydrofera [name] under 3 layer compression. He does report some pain to the wound site. He denies signs of infection. 7/11; patient presents for 1 week follow-up. He has been doing well with Hydrofera [name] under 3 layer compression. He reports improvement in pain since last clinic visit. He has no complaints today. He denies signs of infection. 7/18; patient presents for 1 week follow-up. Has been doing well with Hydrofera [name] under 3 layer compression. He denies signs of infection. [name], [name] ([id number]) [id number] Page 2 of 9 Admission 05/24/2022 [name] is a 67 year old male with a past medical history of venous insufficiency that presents to the clinic for a 1 month history of nonhealing wound to his right lower extremity. He states that a wagon wheel hit it and caused a wound. He has  been treated  9 Signed: 07/31/2023 2:06:31 PM By: Duanne Guess MD FACS Entered By: Duanne Guess on 07/31/2023 14:06:31 -------------------------------------------------------------------------------- Physician Orders Details Patient Name: Date of Service: [name] RD L. 07/31/2023 1:45 PM Medical Record Number: [id number] Patient Account Number: [id number] Date of Birth/Sex: Treating RN: 1955/12/31 (67 y.o. Yates Decamp Primary Care Provider: [name] Other Clinician: Referring Provider: Treating Provider/Extender: Joeseph Amor in Treatment: 4 The following information was scribed by: Brenton Grills The information was scribed for: Duanne Guess Verbal / Phone Orders: No Diagnosis Coding ICD-10 Coding Code Description [phone] Non-pressure chronic ulcer of other part of left lower leg with fat layer exposed L97.812 Non-pressure chronic ulcer of other part of right lower leg with fat layer exposed E11.622 Type 2 diabetes mellitus with other skin ulcer I50.20 Unspecified systolic (congestive) heart failure Discharge From [facility] Services Discharge from Wound Care Center - Congratulations on healing!!!! Bathing/ Shower/ Hygiene May shower and wash wound with soap and water. Edema Control - Lymphedema / SCD / Other Elevate legs to the level of the heart or above for 30 minutes daily and/or when sitting for 3-4 times a day throughout the day. Avoid standing for long periods of time. Patient to wear own compression stockings every day. - right leg daily Exercise regularly Moisturize legs daily. Electronic Signature(s) Signed: 07/31/2023 2:06:46 PM By:  Duanne Guess MD FACS Entered By: Duanne Guess on 07/31/2023 14:06:45 -------------------------------------------------------------------------------- Problem List Details Patient Name: Date of Service: [name] RD L. 07/31/2023 1:45 PM Medical Record Number: [id number] Patient Account Number: [id number] Date of Birth/Sex: Treating RN: 1956-07-28 (67 y.o. Yates Decamp Primary Care Provider: [name] Other Clinician: Referring Provider: Treating Provider/Extender: Joeseph Amor in Treatment: 4 Active Problems ICD-10 Encounter Code Description Active Date MDM Diagnosis [phone] Non-pressure chronic ulcer of other part of left lower leg with fat layer exposed9/17/2024 No Yes [name], [name] ([id number]) [id number] Page 4 of 9 [phone] Non-pressure chronic ulcer of other part of right lower leg with fat layer 07/02/2023 No Yes exposed E11.622 Type 2 diabetes mellitus with other skin ulcer 07/02/2023 No Yes I50.20 Unspecified systolic (congestive) heart failure 07/02/2023 No Yes Inactive Problems Resolved Problems Electronic Signature(s) Signed: 07/31/2023 2:00:46 PM By: Duanne Guess MD FACS Entered By: Duanne Guess on 07/31/2023 14:00:45 -------------------------------------------------------------------------------- Progress Note Details Patient Name: Date of Service: [name] RD L. 07/31/2023 1:45 PM Medical Record Number: [id number] Patient Account Number: [id number] Date of Birth/Sex: Treating RN: 1956-07-19 (67 y.o. M) Primary Care Provider: [name] Other Clinician: Referring Provider: Treating Provider/Extender: Joeseph Amor in Treatment: 4 Subjective Chief Complaint Information obtained from Patient 05/24/2022; right lower extremity wound 07/02/2023: returns with BLE wounds History of Present Illness (HPI) Admission 5/2 [name] is a 66 year old male with a past medical  history of systolic congestive heart failure, COPD, nonischemic cardiomyopathy, type 2 diabetes, chronic venous insufficiency that presents to the clinic for a 6-month history of nonhealing wound to his left lower extremity. He states that in November 2021 he fell off his porch and Hit his leg against a brick. The wound has healed mostly except for 1 area that is still open. He reports moderate Serosanguineous drainage daily. He has been keeping it covered with Band-Aids. He reports 2 rounds of antibiotics for this issue. He denies pain. He denies current acute signs of infection. 5/9; patient presents for 1 week follow-up. He has been using Hydrofera [name] under 3 layer compression. He has no  [name], [name] ([id number]) [id number] Page 1 of 9 Visit Report for 07/31/2023 Chief Complaint Document Details Patient Name: Date of Service: [name] RD L. 07/31/2023 1:45 PM Medical Record Number: [id number] Patient Account Number: [id number] Date of Birth/Sex: Treating RN: [birth year] (67 y.o. M) Primary Care Provider: [name] Other Clinician: Referring Provider: Treating Provider/Extender: Joeseph Amor in Treatment: 4 Information Obtained from: Patient Chief Complaint 05/24/2022; right lower extremity wound 07/02/2023: returns with BLE wounds Electronic Signature(s) Signed: 07/31/2023 2:05:22 PM By: Duanne Guess MD FACS Entered By: Duanne Guess on 07/31/2023 14:05:22 -------------------------------------------------------------------------------- HPI Details Patient Name: Date of Service: [name] RD L. 07/31/2023 1:45 PM Medical Record Number: [id number] Patient Account Number: [id number] Date of Birth/Sex: Treating RN: [birth year] (67 y.o. M) Primary Care Provider: [name] Other Clinician: Referring Provider: Treating Provider/Extender: Joeseph Amor in Treatment: 4 History of Present Illness HPI Description: Admission 5/2 [name] is a 67 year old male with a past medical history of systolic congestive heart failure, COPD, nonischemic cardiomyopathy, type 2 diabetes, chronic venous insufficiency that presents to the clinic for a 47-month history of nonhealing wound to his left lower extremity. He states that in November 2021 he fell off his porch and Hit his leg against a brick. The wound has healed mostly except for 1 area that is still open. He reports moderate Serosanguineous drainage daily. He has been keeping it covered with Band-Aids. He reports 2 rounds of antibiotics for this issue. He denies pain. He denies current acute signs of infection. 5/9; patient presents for 1 week  follow-up. He has been using Hydrofera [name] under 3 layer compression. He has no issues or complaints today. He denies signs of infection. 5/16; patient presents for 1 week follow-up. He has been using Hydrofera [name] under 3 layer compression. He has no issues or complaints today. He denies signs of infection. He states he overall feels well 5/23; patient presents for 1 week follow-up. He has been using Hydrofera [name] under 3 layer compression. He has no issues or complaints today. He denies signs of infection. 6/6; patient presents for 2-week follow-up. He has been using Hydrofera [name] under 3 layer compression. He has had no issues with this. He denies any signs of infection and has no complaints today. Readmissiondifferent wound 6/27 patient developed another wound to his right lower extremity. He states that 1 week ago he had a large admission for a different wound Paining that was hung over his couch fell on his leg. He has been using alcohol and keeping the area covered. He denies signs of infection. 7/5; patient presents for 1 week follow-up. He has been tolerating Hydrofera [name] under 3 layer compression. He does report some pain to the wound site. He denies signs of infection. 7/11; patient presents for 1 week follow-up. He has been doing well with Hydrofera [name] under 3 layer compression. He reports improvement in pain since last clinic visit. He has no complaints today. He denies signs of infection. 7/18; patient presents for 1 week follow-up. Has been doing well with Hydrofera [name] under 3 layer compression. He denies signs of infection. [name], [name] ([id number]) [id number] Page 2 of 9 Admission 05/24/2022 [name] is a 67 year old male with a past medical history of venous insufficiency that presents to the clinic for a 1 month history of nonhealing wound to his right lower extremity. He states that a wagon wheel hit it and caused a wound. He has  been treated  9 Signed: 07/31/2023 2:06:31 PM By: Duanne Guess MD FACS Entered By: Duanne Guess on 07/31/2023 14:06:31 -------------------------------------------------------------------------------- Physician Orders Details Patient Name: Date of Service: [name] RD L. 07/31/2023 1:45 PM Medical Record Number: [id number] Patient Account Number: [id number] Date of Birth/Sex: Treating RN: 1955/12/31 (67 y.o. Yates Decamp Primary Care Provider: [name] Other Clinician: Referring Provider: Treating Provider/Extender: Joeseph Amor in Treatment: 4 The following information was scribed by: Brenton Grills The information was scribed for: Duanne Guess Verbal / Phone Orders: No Diagnosis Coding ICD-10 Coding Code Description [phone] Non-pressure chronic ulcer of other part of left lower leg with fat layer exposed L97.812 Non-pressure chronic ulcer of other part of right lower leg with fat layer exposed E11.622 Type 2 diabetes mellitus with other skin ulcer I50.20 Unspecified systolic (congestive) heart failure Discharge From [facility] Services Discharge from Wound Care Center - Congratulations on healing!!!! Bathing/ Shower/ Hygiene May shower and wash wound with soap and water. Edema Control - Lymphedema / SCD / Other Elevate legs to the level of the heart or above for 30 minutes daily and/or when sitting for 3-4 times a day throughout the day. Avoid standing for long periods of time. Patient to wear own compression stockings every day. - right leg daily Exercise regularly Moisturize legs daily. Electronic Signature(s) Signed: 07/31/2023 2:06:46 PM By:  Duanne Guess MD FACS Entered By: Duanne Guess on 07/31/2023 14:06:45 -------------------------------------------------------------------------------- Problem List Details Patient Name: Date of Service: [name] RD L. 07/31/2023 1:45 PM Medical Record Number: [id number] Patient Account Number: [id number] Date of Birth/Sex: Treating RN: 1956-07-28 (67 y.o. Yates Decamp Primary Care Provider: [name] Other Clinician: Referring Provider: Treating Provider/Extender: Joeseph Amor in Treatment: 4 Active Problems ICD-10 Encounter Code Description Active Date MDM Diagnosis [phone] Non-pressure chronic ulcer of other part of left lower leg with fat layer exposed9/17/2024 No Yes [name], [name] ([id number]) [id number] Page 4 of 9 [phone] Non-pressure chronic ulcer of other part of right lower leg with fat layer 07/02/2023 No Yes exposed E11.622 Type 2 diabetes mellitus with other skin ulcer 07/02/2023 No Yes I50.20 Unspecified systolic (congestive) heart failure 07/02/2023 No Yes Inactive Problems Resolved Problems Electronic Signature(s) Signed: 07/31/2023 2:00:46 PM By: Duanne Guess MD FACS Entered By: Duanne Guess on 07/31/2023 14:00:45 -------------------------------------------------------------------------------- Progress Note Details Patient Name: Date of Service: [name] RD L. 07/31/2023 1:45 PM Medical Record Number: [id number] Patient Account Number: [id number] Date of Birth/Sex: Treating RN: 1956-07-19 (67 y.o. M) Primary Care Provider: [name] Other Clinician: Referring Provider: Treating Provider/Extender: Joeseph Amor in Treatment: 4 Subjective Chief Complaint Information obtained from Patient 05/24/2022; right lower extremity wound 07/02/2023: returns with BLE wounds History of Present Illness (HPI) Admission 5/2 [name] is a 66 year old male with a past medical  history of systolic congestive heart failure, COPD, nonischemic cardiomyopathy, type 2 diabetes, chronic venous insufficiency that presents to the clinic for a 6-month history of nonhealing wound to his left lower extremity. He states that in November 2021 he fell off his porch and Hit his leg against a brick. The wound has healed mostly except for 1 area that is still open. He reports moderate Serosanguineous drainage daily. He has been keeping it covered with Band-Aids. He reports 2 rounds of antibiotics for this issue. He denies pain. He denies current acute signs of infection. 5/9; patient presents for 1 week follow-up. He has been using Hydrofera [name] under 3 layer compression. He has no  [name], [name] ([id number]) [id number] Page 1 of 9 Visit Report for 07/31/2023 Chief Complaint Document Details Patient Name: Date of Service: [name] RD L. 07/31/2023 1:45 PM Medical Record Number: [id number] Patient Account Number: [id number] Date of Birth/Sex: Treating RN: [birth year] (67 y.o. M) Primary Care Provider: [name] Other Clinician: Referring Provider: Treating Provider/Extender: Joeseph Amor in Treatment: 4 Information Obtained from: Patient Chief Complaint 05/24/2022; right lower extremity wound 07/02/2023: returns with BLE wounds Electronic Signature(s) Signed: 07/31/2023 2:05:22 PM By: Duanne Guess MD FACS Entered By: Duanne Guess on 07/31/2023 14:05:22 -------------------------------------------------------------------------------- HPI Details Patient Name: Date of Service: [name] RD L. 07/31/2023 1:45 PM Medical Record Number: [id number] Patient Account Number: [id number] Date of Birth/Sex: Treating RN: [birth year] (67 y.o. M) Primary Care Provider: [name] Other Clinician: Referring Provider: Treating Provider/Extender: Joeseph Amor in Treatment: 4 History of Present Illness HPI Description: Admission 5/2 [name] is a 67 year old male with a past medical history of systolic congestive heart failure, COPD, nonischemic cardiomyopathy, type 2 diabetes, chronic venous insufficiency that presents to the clinic for a 47-month history of nonhealing wound to his left lower extremity. He states that in November 2021 he fell off his porch and Hit his leg against a brick. The wound has healed mostly except for 1 area that is still open. He reports moderate Serosanguineous drainage daily. He has been keeping it covered with Band-Aids. He reports 2 rounds of antibiotics for this issue. He denies pain. He denies current acute signs of infection. 5/9; patient presents for 1 week  follow-up. He has been using Hydrofera [name] under 3 layer compression. He has no issues or complaints today. He denies signs of infection. 5/16; patient presents for 1 week follow-up. He has been using Hydrofera [name] under 3 layer compression. He has no issues or complaints today. He denies signs of infection. He states he overall feels well 5/23; patient presents for 1 week follow-up. He has been using Hydrofera [name] under 3 layer compression. He has no issues or complaints today. He denies signs of infection. 6/6; patient presents for 2-week follow-up. He has been using Hydrofera [name] under 3 layer compression. He has had no issues with this. He denies any signs of infection and has no complaints today. Readmissiondifferent wound 6/27 patient developed another wound to his right lower extremity. He states that 1 week ago he had a large admission for a different wound Paining that was hung over his couch fell on his leg. He has been using alcohol and keeping the area covered. He denies signs of infection. 7/5; patient presents for 1 week follow-up. He has been tolerating Hydrofera [name] under 3 layer compression. He does report some pain to the wound site. He denies signs of infection. 7/11; patient presents for 1 week follow-up. He has been doing well with Hydrofera [name] under 3 layer compression. He reports improvement in pain since last clinic visit. He has no complaints today. He denies signs of infection. 7/18; patient presents for 1 week follow-up. Has been doing well with Hydrofera [name] under 3 layer compression. He denies signs of infection. [name], [name] ([id number]) [id number] Page 2 of 9 Admission 05/24/2022 [name] is a 67 year old male with a past medical history of venous insufficiency that presents to the clinic for a 1 month history of nonhealing wound to his right lower extremity. He states that a wagon wheel hit it and caused a wound. He has  been treated

## 2023-08-12 ENCOUNTER — Ambulatory Visit: Payer: Medicare HMO | Attending: Internal Medicine

## 2023-08-12 DIAGNOSIS — Z95810 Presence of automatic (implantable) cardiac defibrillator: Secondary | ICD-10-CM | POA: Diagnosis not present

## 2023-08-12 DIAGNOSIS — I5022 Chronic systolic (congestive) heart failure: Secondary | ICD-10-CM

## 2023-08-14 ENCOUNTER — Encounter: Payer: Medicare HMO | Attending: Physical Medicine & Rehabilitation | Admitting: Physical Medicine & Rehabilitation

## 2023-08-14 ENCOUNTER — Encounter: Payer: Self-pay | Admitting: Physical Medicine & Rehabilitation

## 2023-08-14 VITALS — BP 118/73 | HR 73 | Ht 70.0 in | Wt 280.0 lb

## 2023-08-14 DIAGNOSIS — G243 Spasmodic torticollis: Secondary | ICD-10-CM | POA: Insufficient documentation

## 2023-08-14 DIAGNOSIS — S12201D Unspecified nondisplaced fracture of third cervical vertebra, subsequent encounter for fracture with routine healing: Secondary | ICD-10-CM | POA: Diagnosis not present

## 2023-08-14 MED ORDER — TIZANIDINE HCL 2 MG PO TABS
2.0000 mg | ORAL_TABLET | Freq: Three times a day (TID) | ORAL | 2 refills | Status: DC
Start: 2023-08-14 — End: 2023-10-02

## 2023-08-14 NOTE — Progress Notes (Signed)
Remote ICD transmission.   

## 2023-08-14 NOTE — Progress Notes (Signed)
Subjective:    Patient ID: [name], male    DOB: 1956-07-19, 67 y.o.   MRN: [id number]  HPI  This is a "follow up" for Mr. Macewan who was last seen in 2016 in his one and only visit by me.  He was seen for low back pain. He fell over the summer at home onto some porcelain tile and struck his head and neck. He suffered multiple other injuries. A CT was performed initially which showed a C7 facet fx and then another in September which revealed the following:  Probably subacute fracture through the anterior/inferior C3 vertebral body with extension to the C3-C4 joint space in slight destruction. In retrospect, suspected subtle fracture on the prior CT from 05/14/2023 at this site. No substantial sagittal subluxation. Prior fracture of the superior articular facet of C7 better characterized on prior CT.  He has been left with a right-leaning neck which is painful at rest and with any attempts at ROM. His pain is constant and only made better with his soft collar and when he sleeps.   He has seen NS who recommended an MRI which is supposed to be done next month.   Sleep is a problem due to wearing the collar which makes him hot.   His PCP put him on tramadol and hydrocodone for pain control. He takes one tramadol before he goes to sleep and one to two hydrocodone during the day for when his pain worsens. He uses soma three x a day for spasms. He does use a biofreeze which gives some temporary results.    Pain Inventory Average Pain 8 Pain Right Now 8 My pain is sharp, burning, stabbing, and aching  In the last 24 hours, has pain interfered with the following? General activity 1 Relation with others 1 Enjoyment of life 1 What TIME of day is your pain at its worst? morning , daytime, evening, and night Sleep (in general) Poor  Pain is worse with: walking, bending, sitting, standing, and some activites Pain improves with: rest, medication, and injections Relief from Meds:  1  Family History  Problem Relation Age of Onset   COPD Mother    Asthma Mother    Colon polyps Mother    Allergies Mother    Hypothyroidism Mother    Asthma Maternal Grandmother    Colon cancer Neg Hx    Social History   Socioeconomic History   Marital status: Divorced    Spouse name: Not on file   Number of children: 2   Years of education: Not on file   Highest education level: Not on file  Occupational History   Occupation: retired/disabled. prev worked in Teacher, English as a foreign language.    Employer: DISABLED  Tobacco Use   Smoking status: Former    Current packs/day: 0.00    Average packs/day: 2.0 packs/day for 30.0 years (60.0 ttl pk-yrs)    Types: Cigarettes    Start date: 10/15/1977    Quit date: 10/16/2007    Years since quitting: 15.8   Smokeless tobacco: Never  Vaping Use   Vaping status: Never Used  Substance and Sexual Activity   Alcohol use: No   Drug use: No   Sexual activity: Not Currently  Other Topics Concern   Not on file  Social History Narrative   Lives alone.   Social Determinants of Health   Financial Resource Strain: Low Risk  (06/29/2022)   Overall Financial Resource Strain (CARDIA)    Difficulty of Paying Living Expenses:  Not hard at all  Food Insecurity: No Food Insecurity (11/26/2022)   Hunger Vital Sign    Worried About Running Out of Food in the Last Year: Never true    Ran Out of Food in the Last Year: Never true  Transportation Needs: No Transportation Needs (11/26/2022)   PRAPARE - Administrator, Civil Service (Medical): No    Lack of Transportation (Non-Medical): No  Physical Activity: Inactive (06/29/2022)   Exercise Vital Sign    Days of Exercise per Week: 0 days    Minutes of Exercise per Session: 0 min  Stress: No Stress Concern Present (06/29/2022)   [employer] of Occupational Health - Occupational Stress Questionnaire    Feeling of Stress : Not at all  Social Connections: Moderately Integrated (06/29/2022)   Social  Connection and Isolation Panel [NHANES]    Frequency of Communication with Friends and Family: More than three times a week    Frequency of Social Gatherings with Friends and Family: More than three times a week    Attends Religious Services: More than 4 times per year    Active Member of Clubs or Organizations: Yes    Attends Engineer, structural: More than 4 times per year    Marital Status: Divorced   Past Surgical History:  Procedure Laterality Date   ACNE CYST REMOVAL     2 on back    AV NODE ABLATION N/A 10/25/2017   Procedure: AV NODE ABLATION;  Surgeon: Duke Salvia, MD;  [facility]: [facility] INVASIVE CV LAB;  Service: Cardiovascular;  Laterality: N/A;   BIV ICD INSERTION CRT-D N/A 09/18/2017   Procedure: BIV ICD INSERTION CRT-D;  Surgeon: Duke Salvia, MD;  Location: [facility] INVASIVE CV LAB;  Service: Cardiovascular;  Laterality: N/A;   CARDIAC CATHETERIZATION N/A 03/21/2016   Procedure: Right/Left Heart Cath and Coronary Angiography;  Surgeon: Laurey Morale, MD;  Location: [facility] INVASIVE CV LAB;  Service: Cardiovascular;  Laterality: N/A;   CARDIOVERSION  04/18/2012   Procedure: CARDIOVERSION;  Surgeon: Pricilla Riffle, MD;  Location: [facility] OR;  Service: Cardiovascular;  Laterality: N/A;   CARDIOVERSION  04/25/2012   Procedure: CARDIOVERSION;  Surgeon: Vesta Mixer, MD;  Location: [facility] ENDOSCOPY;  Service: Cardiovascular;  Laterality: N/A;   CARDIOVERSION  04/25/2012   Procedure: CARDIOVERSION;  Surgeon: Pricilla Riffle, MD;  Location: [facility] OR;  Service: Cardiovascular;  Laterality: N/A;   CARDIOVERSION  05/09/2012   Procedure: CARDIOVERSION;  Surgeon: Tonny Bollman, MD;  Location: [facility] OR;  Service: Cardiovascular;  Laterality: N/A;  changed from crenshaw to cooper by trish/leone-endo   CARDIOVERSION N/A 03/15/2015   Procedure: CARDIOVERSION;  Surgeon: Vesta Mixer, MD;  Location: [facility] ENDOSCOPY;  Service: Cardiovascular;  Laterality: N/A;   COLONOSCOPY     COLONOSCOPY WITH PROPOFOL N/A 10/21/2014    Procedure: COLONOSCOPY WITH PROPOFOL;  Surgeon: Meryl Dare, MD;  Location: [facility] ENDOSCOPY;  Service: Endoscopy;  Laterality: N/A;   EP IMPLANTABLE DEVICE N/A 04/06/2015   Procedure: Loop Recorder Insertion;  Surgeon: Marinus Maw, MD;  Location: [facility] INVASIVE CV LAB;  Service: Cardiovascular;  Laterality: N/A;   ESOPHAGOGASTRODUODENOSCOPY     JOINT REPLACEMENT     LOOP RECORDER REMOVAL N/A 09/18/2017   Procedure: LOOP RECORDER REMOVAL;  Surgeon: Duke Salvia, MD;  Location: [facility] INVASIVE CV LAB;  Service: Cardiovascular;  Laterality: N/A;   RIGHT/LEFT HEART CATH AND CORONARY ANGIOGRAPHY N/A 01/28/2017   Procedure: Right/Left Heart Cath and Coronary Angiography;  Surgeon:  Laurey Morale, MD;  Location: [facility] INVASIVE CV LAB;  Service: Cardiovascular;  Laterality: N/A;   TEE WITHOUT CARDIOVERSION  04/25/2012   Procedure: TRANSESOPHAGEAL ECHOCARDIOGRAM (TEE);  Surgeon: Vesta Mixer, MD;  Location: [facility] ENDOSCOPY;  Service: Cardiovascular;  Laterality: N/A;   TEE WITHOUT CARDIOVERSION N/A 03/15/2015   Procedure: TRANSESOPHAGEAL ECHOCARDIOGRAM (TEE);  Surgeon: Vesta Mixer, MD;  Location: [facility] ENDOSCOPY;  Service: Cardiovascular;  Laterality: N/A;   TONSILLECTOMY AND ADENOIDECTOMY     TOTAL KNEE ARTHROPLASTY Right 06/15/2014   Procedure: TOTAL KNEE ARTHROPLASTY;  Surgeon: Sheral Apley, MD;  Location: [facility] OR;  Service: Orthopedics;  Laterality: Right;   TOTAL KNEE ARTHROPLASTY Left 10/17/2021   Procedure: Left TOTAL KNEE ARTHROPLASTY;  Surgeon: Kathryne Hitch, MD;  Location: [facility] OR;  Service: Orthopedics;  Laterality: Left;   Past Surgical History:  Procedure Laterality Date   ACNE CYST REMOVAL     2 on back    AV NODE ABLATION N/A 10/25/2017   Procedure: AV NODE ABLATION;  Surgeon: Duke Salvia, MD;  Location: [facility] INVASIVE CV LAB;  Service: Cardiovascular;  Laterality: N/A;   BIV ICD INSERTION CRT-D N/A 09/18/2017   Procedure: BIV ICD INSERTION CRT-D;  Surgeon: Duke Salvia, MD;   Location: [facility] INVASIVE CV LAB;  Service: Cardiovascular;  Laterality: N/A;   CARDIAC CATHETERIZATION N/A 03/21/2016   Procedure: Right/Left Heart Cath and Coronary Angiography;  Surgeon: Laurey Morale, MD;  Location: [facility] INVASIVE CV LAB;  Service: Cardiovascular;  Laterality: N/A;   CARDIOVERSION  04/18/2012   Procedure: CARDIOVERSION;  Surgeon: Pricilla Riffle, MD;  Location: [facility] OR;  Service: Cardiovascular;  Laterality: N/A;   CARDIOVERSION  04/25/2012   Procedure: CARDIOVERSION;  Surgeon: Vesta Mixer, MD;  Location: [facility] ENDOSCOPY;  Service: Cardiovascular;  Laterality: N/A;   CARDIOVERSION  04/25/2012   Procedure: CARDIOVERSION;  Surgeon: Pricilla Riffle, MD;  Location: [facility] OR;  Service: Cardiovascular;  Laterality: N/A;   CARDIOVERSION  05/09/2012   Procedure: CARDIOVERSION;  Surgeon: Tonny Bollman, MD;  Location: [facility] OR;  Service: Cardiovascular;  Laterality: N/A;  changed from crenshaw to cooper by trish/leone-endo   CARDIOVERSION N/A 03/15/2015   Procedure: CARDIOVERSION;  Surgeon: Vesta Mixer, MD;  Location: [facility] ENDOSCOPY;  Service: Cardiovascular;  Laterality: N/A;   COLONOSCOPY     COLONOSCOPY WITH PROPOFOL N/A 10/21/2014   Procedure: COLONOSCOPY WITH PROPOFOL;  Surgeon: Meryl Dare, MD;  Location: [facility] ENDOSCOPY;  Service: Endoscopy;  Laterality: N/A;   EP IMPLANTABLE DEVICE N/A 04/06/2015   Procedure: Loop Recorder Insertion;  Surgeon: Marinus Maw, MD;  Location: [facility] INVASIVE CV LAB;  Service: Cardiovascular;  Laterality: N/A;   ESOPHAGOGASTRODUODENOSCOPY     JOINT REPLACEMENT     LOOP RECORDER REMOVAL N/A 09/18/2017   Procedure: LOOP RECORDER REMOVAL;  Surgeon: Duke Salvia, MD;  Location: [facility] INVASIVE CV LAB;  Service: Cardiovascular;  Laterality: N/A;   RIGHT/LEFT HEART CATH AND CORONARY ANGIOGRAPHY N/A 01/28/2017   Procedure: Right/Left Heart Cath and Coronary Angiography;  Surgeon: Laurey Morale, MD;  Location: [facility] INVASIVE CV LAB;  Service: Cardiovascular;  Laterality: N/A;   TEE  WITHOUT CARDIOVERSION  04/25/2012   Procedure: TRANSESOPHAGEAL ECHOCARDIOGRAM (TEE);  Surgeon: Vesta Mixer, MD;  Location: [facility] ENDOSCOPY;  Service: Cardiovascular;  Laterality: N/A;   TEE WITHOUT CARDIOVERSION N/A 03/15/2015   Procedure: TRANSESOPHAGEAL ECHOCARDIOGRAM (TEE);  Surgeon: Vesta Mixer, MD;  Location: [facility] ENDOSCOPY;  Service: Cardiovascular;  Laterality: N/A;   TONSILLECTOMY AND ADENOIDECTOMY  TOTAL KNEE ARTHROPLASTY Right 06/15/2014   Procedure: TOTAL KNEE ARTHROPLASTY;  Surgeon: Sheral Apley, MD;  Location: [facility] OR;  Service: Orthopedics;  Laterality: Right;   TOTAL KNEE ARTHROPLASTY Left 10/17/2021   Procedure: Left TOTAL KNEE ARTHROPLASTY;  Surgeon: Kathryne Hitch, MD;  Location: [facility] OR;  Service: Orthopedics;  Laterality: Left;   Past Medical History:  Diagnosis Date   AICD (automatic cardioverter/defibrillator) present    Anemia    supposed to be taking Vit B but doesn't   ANXIETY    takes Xanax nightly   Arthritis    Asthma    Albuterol prn and Advair daily;also takes Prednisone daily   Cardiomyopathy (HCC)    a. EF 25% TEE July 2013; b. EF normalized 2015;  c. 03/2015 Echo: EF 40-45%, difrf HK, PASP 38 mmHg, Mild MR, sev LAE/RAE.   Chronic constipation    takes OTC stool softener   COPD (chronic obstructive pulmonary disease) (HCC)    "one dr says COPD; one dr says emphysema" (09/18/2017)   COVID-19 12/09/2019   DEPRESSION    takes Zoloft and Doxepin daily   Diverticulitis    DYSKINESIA, ESOPHAGUS    Essential hypertension        FIBROMYALGIA    GERD (gastroesophageal reflux disease)        Glaucoma    HYPERLIPIDEMIA    a. Intolerant to statins.   INSOMNIA    takes Ambien nightly   Myocardial infarction [facility])    a. 2012 Myoview notable for prior infarct;  b. 03/2015 Lexiscan CL: EF 37%, diff HK, small area of inferior infarct from apex to base-->Med Rx.   O2 dependent    "2.5L q hs & prn" (09/18/2017)   Paroxysmal atrial fibrillation (HCC)     a. CHA2DS2VASc = 3--> takes Coumadin;  b. 03/15/2015 Successful TEE/DCCV;  c. 03/2015 recurrent afib, Amio d/c'd in setting of hyperthyroidism.   Peripheral neuropathy    Pneumonia 12/2016   Rash and other nonspecific skin eruption 04/12/2009   no cause found saw dermatologists x 2 and allergist   SLEEP APNEA, OBSTRUCTIVE    a. doesn't use CPAP   Syncope    a. 03/2015 s/p MDT LINQ.   Type II diabetes mellitus (HCC)        Ht 5\' 10"  (1.778 m)   Wt 280 lb (127 kg)   BMI 40.18 kg/m   Opioid Risk Score:   Fall Risk Score:  `1  Depression screen PHQ 2/9     08/14/2023    2:32 PM 07/01/2023    1:27 PM 03/01/2023    2:28 PM 12/28/2022    1:33 PM 12/18/2022   10:06 AM 06/29/2022    1:15 PM 05/17/2022    2:50 PM  Depression screen PHQ 2/9  Decreased Interest 0 0 0 0 0 0 2  Down, Depressed, Hopeless 0 0 0 0 0 0 2  PHQ - 2 Score 0 0 0 0 0 0 4  Altered sleeping    0   2  Tired, decreased energy    0   3  Change in appetite    0   3  Feeling bad or failure about yourself     0   3  Trouble concentrating    0   2  Moving slowly or fidgety/restless    0   2  Suicidal thoughts    0   1  PHQ-9 Score    0   20  Difficult  doing work/chores    Not difficult at all   Somewhat difficult      Review of Systems  Musculoskeletal:  Positive for back pain and neck pain.       Right shoulder pain   All other systems reviewed and are negative.     Objective:   Physical Exam  Gen: no distress, normal appearing HEENT: oral mucosa pink and moist, NCAT Cardio: Reg rate Chest: normal effort, normal rate of breathing Abd: soft, non-distended Ext: no edema Psych: pleasant, normal affect Skin: intact Neuro: Alert and oriented x 3. Normal insight and awareness. Intact Memory. Normal language and speech. Cranial nerve exam unremarkable. MMT: motor 5/5 all 4's.   Musculoskeletal: Patient with significant head forward posture.  Head is rotated to the left and flexed to the right.  He is significant  tightness and discomfort with palpation over the right sternocleidomastoid muscle as well as the levator scapula.  Trapezius muscles also tight to a certain extent.  When he stands he tends to continue with the same posture.  Patient was able to self-correct to a certain extent probably improving posture by about 25%.  When I forcibly tried to rotate and move his head I could improved by maybe 30 to 40% with some discomfort.      Assessment & Plan:  1. Chronic thoracic and lumbar spine pain. He has DDD of the thoracic spine on plain film which we reviewed today. The most affected levels appear to be T3-6. His MRI shows no acute disease, and the most affected levels remain below the area where is pain is presenting.   2. Myofascial pain in thoracic paraspinals, maladaptive posture syndrome   3. Lumbar-thoracic scoliosis   4. ?RA- dxed by rheumatologist.   5. Obesity   6. OA of the bilateral knees, left more than right.   7. Major depression 8.  Patient with fall over the summer with cervical spine fractures.  Fractures appeared somewhat stable on most recent CT from September.  As noted above this is someone who already had a maladaptive posture issue.  Now he is has significant cervical dystonia involving his right sternocleidomastoid and levator scapula amongst other muscles.      Plan:   1.  Made a referral to physical therapy for range of motion and strengthening and modalities as indicated..  2.  DC Soma begin trial of tizanidine 2 mg at night initially then every 8 hours thereafter as tolerated.   3.  Follow-up with neurosurgery once he has his MRI done next month. 4.  Probably will need botulinum toxin injections.  e    5.  Tramadol and hydrocodone per primary 6. Follow up in follow-up with me in 2 months  Thirty minutes of face to face patient care time were spent during this visit. All questions were encouraged and answered.

## 2023-08-14 NOTE — Patient Instructions (Signed)
TRY USING A WEDGE OF SOME SORT NEXT TO THE RIGHT SIDE OF YOUR HEAD WHEN YOU SLEEP.   TRY TIZANIDINE ONLY AT NIGHT FOR 4 DAYS THEN INCREASE TO THREE X DAILY AS TOLERATED.

## 2023-08-16 ENCOUNTER — Telehealth: Payer: Self-pay | Admitting: Physical Medicine & Rehabilitation

## 2023-08-16 ENCOUNTER — Telehealth: Payer: Self-pay

## 2023-08-16 ENCOUNTER — Encounter: Payer: Self-pay | Admitting: *Deleted

## 2023-08-16 NOTE — Telephone Encounter (Signed)
Dr Riley Kill is out of the office for the next week. I will let the pt know by [facility] message and send to Dr Riley Kill inbox.

## 2023-08-16 NOTE — Telephone Encounter (Signed)
Remote ICM transmission received.  Attempted call to patient regarding ICM remote transmission and left detailed message per DPR.  Left ICM phone number and advised to return call for any fluid symptoms or questions. Next ICM remote transmission scheduled 09/16/2023.

## 2023-08-16 NOTE — Progress Notes (Signed)
EPIC Encounter for ICM Monitoring  Patient Name: [name] is a 67 y.o. male Date: 08/16/2023 Primary Care Physican: [name], MD Primary Cardiologist: Shirlee Latch Electrophysiologist: Joycelyn Schmid Pacing: 99.7%    07/09/2023 Weight: 280 lbs     Attempted call to patient and unable to reach.  Left detailed message per DPR regarding transmission. Transmission reviewed.    Optivol thoracic impedance suggesting normal fluid levels with the exception of possible fluid accumulation from 10/4-10/14 and 10/18-10/21.     Prescribed:  Torsemide 20 mg 4 tablet(s) (80 mg total) by mouth twice day.    Confirmed 9/24 that he is taking as prescribed Potassium 20 mEq take 2 tablets (40 mEq total) daily   Labs: 07/07/2023 BMET scheduled with Dr Raphael Gibney office to recheck potassium  07/01/2023 Creatinine 1.09, BUN 20, Potassium 5.7, Sodium 142, GFR 69.72 (Potassium addressed by PCP, held K+ x 5 days and restarted at 40 mEq daily) 06/19/2023 Creatinine 1.38, BUN 21, Potassium 5.0, Sodium 134 05/15/2023 Creatinine 0.99, BUN 29, Potassium 3.8, Sodium 138, GFR >60 05/14/2023 Creatinine 1.23, BUN 37, Potassium 4.2, Sodium 138, GFR >60 03/22/2023 Creatinine 1.16, BUN 20, Potassium 4.0, Sodium 138, GFR >60 02/18/2023 Creatinine 1.20, BUN 23, Potassium 4.4, Sodium 135, GFR >60 01/25/2023 Creatinine 1.22, BUN 37, Potassium 3.6, Sodium 138, GFR >60 01/15/2023 Creatinine 1.38, BUN 22, Potassium 4.6, Sodium 134, GFR 52.70  12/31/2022 Creatinine 1.27, BUN 36, Potassium 5.2, Sodium 136, GFR 58.24  12/18/2022 Creatinine 1.13, BUN 36, Potassium 4.5, Sodium 135  11/24/2022 Creatinine 1.45, BUN 45, Potassium 3.2, Sodium 135, GFR 53 (12:09 PM) 11/24/2022 Creatinine 1.59, BUN 51, Potassium 4.0, Sodium 138, GFR 47 (6:01 AM) A complete set of results can be found in Results Review.   Recommendations:   Left voice mail with ICM number and encouraged to call if experiencing any fluid symptoms.   Follow-up plan: ICM clinic  phone appointment on 09/16/2023.   91 day device clinic remote transmission 10/23/2023.   EP/Cardiology Office Visits:    Recall 11/28/2023 with Dr Graciela Husbands.  09/04/2023 with Dr Shirlee Latch.  3 month ICM trend: 08/14/2023.    12-14 Month ICM trend:     Karie Soda, RN 08/16/2023 12:02 PM

## 2023-08-16 NOTE — Telephone Encounter (Signed)
Patient called in and forgot to mention during his visit that he has lost feeling on his R index finger as well. He also wanted to request if PT could be ordered for in home instead of outpatient because he has difficulty finding transportation .

## 2023-08-21 ENCOUNTER — Encounter: Payer: Self-pay | Admitting: Internal Medicine

## 2023-08-22 MED ORDER — CLINDAMYCIN HCL 300 MG PO CAPS: 300.0000 mg | ORAL_CAPSULE | Freq: Three times a day (TID) | ORAL | 0 refills | Status: DC

## 2023-08-23 ENCOUNTER — Telehealth: Payer: Self-pay | Admitting: Internal Medicine

## 2023-08-23 ENCOUNTER — Other Ambulatory Visit: Payer: Self-pay | Admitting: Family Medicine

## 2023-08-23 NOTE — Telephone Encounter (Signed)
Patient called and said that the rx - antibiotic that was sent in yesterday [facility] - [facility] to [place] and he no longer uses this one - [facility] [address],

## 2023-08-26 ENCOUNTER — Telehealth: Payer: Self-pay | Admitting: Internal Medicine

## 2023-08-26 MED ORDER — CLINDAMYCIN HCL 300 MG PO CAPS: 300.0000 mg | ORAL_CAPSULE | Freq: Three times a day (TID) | ORAL | 0 refills | Status: DC

## 2023-08-26 NOTE — Telephone Encounter (Signed)
Ok done erx 

## 2023-08-26 NOTE — Telephone Encounter (Signed)
This med was recently discontinued by another provider.  Will hold on restart at this time , thanks

## 2023-08-26 NOTE — Telephone Encounter (Signed)
Prescription Request  08/26/2023  LOV: 07/01/2023  What is the name of the medication or equipment? carisoprodol (SOMA) tablet 350 mg   Have you contacted your pharmacy to request a refill? No   Which pharmacy would you like this sent to?     [id number] [place], [place] - [address] [place] [address] Phone: [phone] Fax: [phone] Patient notified that their request is being sent to the clinical staff for review and that they should receive a response within 2 business days.   Please advise at Mobile [phone] (mobile)

## 2023-09-02 ENCOUNTER — Telehealth: Payer: Self-pay | Admitting: Family Medicine

## 2023-09-02 NOTE — Telephone Encounter (Signed)
Pt recently saw pain mgmt, Dr. Harold Hedge. He is interested in injecting botox into his neck.  Pt is hesitant about this procedure and wanted to get Dr. Michaelle Copas thoughts. Ok to send via [employer].

## 2023-09-04 ENCOUNTER — Encounter (HOSPITAL_COMMUNITY): Payer: Self-pay | Admitting: Cardiology

## 2023-09-04 ENCOUNTER — Ambulatory Visit (HOSPITAL_COMMUNITY)
Admission: RE | Admit: 2023-09-04 | Discharge: 2023-09-04 | Disposition: A | Discharge status: Home / Self Care | Payer: Medicare HMO | Source: Ambulatory Visit | Attending: Cardiology | Admitting: Cardiology

## 2023-09-04 VITALS — BP 108/70 | HR 70 | Wt 285.0 lb

## 2023-09-04 DIAGNOSIS — I5022 Chronic systolic (congestive) heart failure: Secondary | ICD-10-CM | POA: Insufficient documentation

## 2023-09-04 LAB — BASIC METABOLIC PANEL
Anion gap: 10 (ref 5–15)
BUN: 26 mg/dL — ABNORMAL HIGH (ref 8–23)
CO2: 29 mmol/L (ref 22–32)
Calcium: 9.4 mg/dL (ref 8.9–10.3)
Chloride: 101 mmol/L (ref 98–111)
Creatinine, Ser: 1.09 mg/dL (ref 0.61–1.24)
GFR, Estimated: >60 mL/min (ref >60)
Glucose, Bld: 83 mg/dL (ref 70–99)
Potassium: 4 mmol/L (ref 3.5–5.1)
Sodium: 140 mmol/L (ref 135–145)

## 2023-09-04 LAB — BRAIN NATRIURETIC PEPTIDE: B Natriuretic Peptide: 38.9 pg/mL (ref 0.0–100.0)

## 2023-09-04 MED ORDER — EPLERENONE 25 MG PO TABS: 50.0000 mg | ORAL_TABLET | Freq: Every day | ORAL | 8 refills | Status: DC

## 2023-09-04 MED ORDER — POTASSIUM CHLORIDE CRYS ER 20 MEQ PO TBCR: 20.0000 meq | EXTENDED_RELEASE_TABLET | Freq: Every day | ORAL | 3 refills | Status: DC

## 2023-09-04 NOTE — Patient Instructions (Addendum)
INCREASE Eplerenone to 50 mg daily.  DECREASE Potassium to 1 tab daily.  Labs done today, your results will be available in MyChart, we will contact you for abnormal readings.  Your physician has requested that you have an echocardiogram. Echocardiography is a painless test that uses sound waves to create images of your heart. It provides your doctor with information about the size and shape of your heart and how well your heart's chambers and valves are working. This procedure takes approximately one hour. There are no restrictions for this procedure. Please do NOT wear cologne, perfume, aftershave, or lotions (deodorant is allowed). Please arrive 15 minutes prior to your appointment time.  Please note: We ask at that you not bring children with you during ultrasound (echo/ vascular) testing. Due to room size and safety concerns, children are not allowed in the ultrasound rooms during exams. Our front office staff cannot provide observation of children in our lobby area while testing is being conducted. An adult accompanying a patient to their appointment will only be allowed in the ultrasound room at the discretion of the ultrasound technician under special circumstances. We apologize for any inconvenience.  Your physician recommends that you schedule a follow-up appointment in: 2 months  If you have any questions or concerns before your next appointment please send [place] a message through [place] or call our office at [phone].    TO LEAVE A MESSAGE FOR THE NURSE SELECT OPTION 2, PLEASE LEAVE A MESSAGE INCLUDING: YOUR NAME DATE OF BIRTH CALL BACK NUMBER REASON FOR CALL**this is important as we prioritize the call backs  YOU WILL RECEIVE A CALL BACK THE SAME DAY AS LONG AS YOU CALL BEFORE 4:00 PM  At the [facility], you and your health needs are our priority. As part of our continuing mission to provide you with exceptional heart care, we have created designated Provider  Care Teams. These Care Teams include your primary Cardiologist (physician) and Advanced Practice Providers (APPs- Physician Assistants and Nurse Practitioners) who all work together to provide you with the care you need, when you need it.   You may see any of the following providers on your designated Care Team at your next follow up: Dr Arvilla Meres Dr Marca Ancona Dr. Dorthula Nettles Dr. Clearnce Hasten Amy Filbert Schilder, NP Robbie Lis, [place] [facility] [place], [place] Brynda Peon, NP [place] Lee, NP Karle Plumber, PharmD   Please be sure to bring in all your medications bottles to every appointment.    Thank you for choosing [facility]

## 2023-09-04 NOTE — Telephone Encounter (Signed)
Sent patient MyChart message.

## 2023-09-05 NOTE — Progress Notes (Signed)
ID:  [name], DOB 1956/04/17, MRN [id number]  Provider location:  [facility] Type of Visit: Established patient  PCP:  [name], MD  Cardiologist:  Dr. Shirlee Latch   History of Present Illness: [name] is a 67 y.o. male who has history of COPD on home oxygen, permanent atrial fibrillation, and chronic systolic CHF.  Amiodarone and DCCV were tried for atrial fibrillation in the past without success.   Patient has been noted to have a low EF since 2013.  EF improved to normal by 2015 but dropped to 30-35% by 11/16.  Cath in 6/17 showed some CAD but not enough to cause cardiomyopathy.  He was admitted in 3/18 with PNA, atrial fibrillation/RVR, and acute on chronic systolic CHF.  Echo in 3/18 showed fall in EF to 10-15%.   Underwent right and left heart cath in 4/18, which showed nonobstructive CAD and relatively optimized filling pressures.    Syncope in 11/18. Loop recorder showed 6 second pause and periods of complete heart block. He had Medtronic BiV ICD placed in 12/18.  In 1/19, he had AV nodal ablation to promote BiV pacing.   Echo was done 6/19 with EF 25-30% with moderate RV dilation/mildly decreased systolic function. CPX 7/19 was submaximal, probably mild functional impairment.    Echo in 1/21 showed EF 35-40% with diffuse hypokinesis, moderate RV dilation with mildly decreased systolic function.   He was admitted with COVID-19 PNA in 2/19.  He had AKI/hypotension and Entresto was stopped.   Echo in 3/22 showed EF 40% with diffuse hypokinesis, mild RV enlargement, mildly decreased RV systolic function.   [facility]ed [facility]22 with weakness and dizziness. Found to be in hemorrhagic shock from acute GIB. Hgb 5.0, INR 8.4. He was on Coumadin for atrial fibrillation. He was resuscitated with PRBCs, IVF, and given Vitamin K. His Entresto, beta blocker and torsemide were held due to hypotension. He initially required ICU monitoring due to hypotension  requiring pressors. GI team was consulted and planned to treat with oral antibiotics for suspected diverticular bleed with plans for outpatient colonoscopy.  Coumadin was transitioned to Eliquis.  He was discharged back on beta blocker, Entresto and SGLT2i. Dig, spiro and torsemide were held at discharge. Weight 259 lbs.  S/p TKR 1/23  Follow up 12/23, NYHA II-early III, volume overloaded and torsemide increased to 80 bid.   Echo 1/24 EF 40%, RV normal, no significant change from prior.  Admitted 2/24 w/ RLE cellulitis, failed outpatient abx. Received IV eventually transitioned to oral cefadroxil. Followed by ID, saw [name] 12/18/22 and arranged for CT of RLE to rule out deep tissue abscess. Finished abx course.  Today he returns for HF follow up. He fell recently and fractured 3 cervical vertebrae.  He has significant neck pain.  He may need an operation but not sure yet. Stable dyspnea, gets short of breath walking about 50 feet.  He has rare atypical chest pain.  Weight up 9 lbs, has not been exercising much at all since his fall. He is off [place] due to a yeast infection.    MDT device interrogation (personally reviewed): 96% BiV pacing, stable thoracic impedance, no VT, minimal activity.   ECG (personally reviewed): Atrial fibrillation with BiV pacing  Labs (1/19): K 4.4, creatinine 0.75, pro-BNP 433 Labs (2/19): digoxin 0.4 Labs (3/19): LDL 121, hgb 12.8, K 4.5, creatinine 0.98 Labs (6/19): LDL 48, HDL 45, K 5, creatinine 0.94, digoxin 0.3 Labs (9/19): K 3.7,  creatinine 1.09 => 1.32, digoxin 0.2 Labs (11/19): LDL 115 Labs (12/20): K 3.9, creatinine 0.87, LDL 94  Labs (3/21): K 4, creatinine 1.08 Labs (6/21): LDL 109 Labs (7/21): K 3.7, creatinine 1.15 Labs (12/21): LDL 129, TGs 239, hgb 12.9, K 3.9, creatinine 0.98, digoxin 0.8 Labs (1/22): ALT 76, AST 40, LDL 162, K 3.8 =>4.6, creatinine 1.16 => 1.12, digoxin 0.7, repeat LFTs normal Labs (5/22): LDL 72, TGs 335, K 4.2,  creatinine 1.29 Labs (7/22): K 4.0, creatinine 0.77 Labs (8/22): K 4.0, creatinine 1.11 Labs (9/22): K 3.7, creatinine 1.14, hgb 11.7 Labs (3/23): LDL 28, TGs 132, LFTs normal, K 4.3, creatinine 1.0 Labs (6/23): K 4.4, creatinine 1.37 Labs (2/24): K 3.2, creatinine 1.45 Labs (3/24): K 4.4, creatinine 1.13, LDL 79 Labs (4/24): K 4.6, creatinine 1.38  Labs (5/24): K 4.4, creatinine 1.2 Labs (9/24): K 5.7, creatinine 1.09, LDL 38  PMH: 1. Asthma 2. COPD: On home oxygen. Prior smoker.  3. Atrial fibrillation: Permanent.  He failed DCCV and amiodarone in the past . 4. HTN 5. GERD 6. Hyperlipidemia: Myalgias with atorvastatin 7. BPH.  8. Chronic systolic CHF: Nonischemic cardiomyopathy.  - EF 25% by TEE in 7/13.  Normalized on 2015 echo. - Echo (11/16): EF 30-35%.  - LHC/RHC (6/17): 80% ostial stenosis small D1, 30-40% pLAD.  Mean RA 4, PA 26/10, mean PCWP 11, CI 2.33.  - Echo (3/18): EF 10-15%, diffuse hypokinesis, severe LV dilation, moderate RV dilation with severely decreased systolic function.  - LHC/RHC (4/18): 40% proximal LAD.  Mean RA 8, PA 37/10 mean 22, mean PCWP 22, LVEDP 9, CI 2.39. - Medtronic BiV ICD placement in 12/18 with AV nodal ablation to promote BiV pacing in 1/19.  - Echo (6/19): EF 25-30% with mild LV dilation, moderately dilated RV with mildly decreased systolic function, IVC not dilated, PASP 23 mmHg.  - CPX (7/19): peak VO2 17.6 (78% predicted), VE/VCO2 slope 31, RER 1.02 => submaximal, probably mild functional impairment.  - Echo (1/21): EF 35-40% with diffuse hypokinesis, moderate RV dilation with mildly decreased systolic function. - Echo (3/22): EF 40% with diffuse hypokinesis, mild RV enlargement, mildly decreased RV systolic function.  - Echo (1/24): EF 40%, RV normal 9. Morbid obesity 10. ILR in place.  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 Complete heart block: s/p Medtronic CRT-D.  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 OSA: Untreated, unable to tolerate  CPAP.  15. COVID-19 PNA 2/21.  16. Hyperlipidemia: Myalgias with multiple statins.  17. PAD: ABIs 10/21 noncompressible on right but TBI normal, normal ABI and TBI on left.  18. GI bleeding: 7/22, diverticulosis.  19. Left TKR (1/23)  Social History   Socioeconomic History   Marital status: Divorced    Spouse name: Not on file   Number of children: 2   Years of education: Not on file   Highest education level: Not on file  Occupational History   Occupation: retired/disabled. prev worked in Teacher, English as a foreign language.    Employer: DISABLED  Tobacco Use   Smoking status: Former    Current packs/day: 0.00    Average packs/day: 2.0 packs/day for 30.0 years (60.0 ttl pk-yrs)    Types: Cigarettes    Start date: 10/15/1977    Quit date: 10/16/2007    Years since quitting: 15.8   Smokeless tobacco: Never  Vaping Use   Vaping status: Never Used  Substance and Sexual Activity   Alcohol use: No   Drug use: No   Sexual activity: Not Currently  Other Topics Concern  Not on file  Social History Narrative   Lives alone.   Social Determinants of Health   Financial Resource Strain: Low Risk  (06/29/2022)   Overall Financial Resource Strain (CARDIA)    Difficulty of Paying Living Expenses: Not hard at all  Food Insecurity: No Food Insecurity (11/26/2022)   Hunger Vital Sign    Worried About Running Out of Food in the Last Year: Never true    Ran Out of Food in the Last Year: Never true  Transportation Needs: No Transportation Needs (11/26/2022)   PRAPARE - Administrator, Civil Service (Medical): No    Lack of Transportation (Non-Medical): No  Physical Activity: Inactive (06/29/2022)   Exercise Vital Sign    Days of Exercise per Week: 0 days    Minutes of Exercise per Session: 0 min  Stress: No Stress Concern Present (06/29/2022)   [employer] of Occupational Health - Occupational Stress Questionnaire    Feeling of Stress : Not at all  Social Connections: Moderately Integrated  (06/29/2022)   Social Connection and Isolation Panel [NHANES]    Frequency of Communication with Friends and Family: More than three times a week    Frequency of Social Gatherings with Friends and Family: More than three times a week    Attends Religious Services: More than 4 times per year    Active Member of [employer] or Organizations: Yes    Attends Engineer, structural: More than 4 times per year    Marital Status: Divorced  Intimate Partner Violence: Not At Risk (11/22/2022)   Humiliation, Afraid, Rape, and Kick questionnaire    Fear of Current or Ex-Partner: No    Emotionally Abused: No    Physically Abused: No    Sexually Abused: No   Family History  Problem Relation Age of Onset   COPD Mother    Asthma Mother    Colon polyps Mother    Allergies Mother    Hypothyroidism Mother    Asthma Maternal Grandmother    Colon cancer Neg Hx    ROS: All systems reviewed and negative except as per HPI.   Current Outpatient Medications  Medication Sig Dispense Refill   acetaminophen (TYLENOL) 500 MG tablet Take 500-1,000 mg by mouth every 6 (six) hours as needed for mild pain or headache.     albuterol (VENTOLIN HFA) 108 (90 Base) MCG/ACT inhaler Inhale 1-2 puffs into the lungs every 6 (six) hours as needed for wheezing or shortness of breath. 18 g 5   ALPRAZolam (XANAX) 0.5 MG tablet TAKE 2 TABLETS (1 MG TOTAL) BY MOUTH EVERY DAY AT BEDTIME 60 tablet 5   ammonium lactate (AMLACTIN) 12 % cream Apply 1 Application topically as needed for dry skin. 385 g 3   apixaban (ELIQUIS) 5 MG TABS tablet Take 1 tablet (5 mg total) by mouth 2 (two) times daily. 60 tablet 11   ascorbic acid (VITAMIN C) 500 MG tablet Take 500 mg by mouth daily.     augmented betamethasone dipropionate (DIPROLENE-AF) 0.05 % cream Apply 1 application  topically daily as needed (to affected areas- for itching).     carvedilol (COREG) 3.125 MG tablet Take 1 tablet (3.125 mg total) by mouth 2 (two) times daily. 180 tablet  1   Cholecalciferol (VITAMIN D3) 1000 units CAPS Take 1,000 Units by mouth daily.     clindamycin (CLEOCIN) 300 MG capsule Take 1 capsule (300 mg total) by mouth 3 (three) times daily. 30 capsule 0  clotrimazole-betamethasone (LOTRISONE) cream Apply 1 Application topically daily. 30 g 0   doxepin (SINEQUAN) 10 MG capsule TAKE 2 CAPSULES BY MOUTH AT BEDTIME AS NEEDED. 180 capsule 1   DULoxetine (CYMBALTA) 60 MG capsule TAKE 1 CAPSULE BY MOUTH EVERY DAY 90 capsule 2   Dupilumab (DUPIXENT) 300 MG/2ML SOPN Inject 300 mg into the skin every 14 (fourteen) days.     Evolocumab (REPATHA SURECLICK) 140 MG/ML SOAJ Inject 140 mg into the skin every 14 (fourteen) days. 6 mL 3   fluconazole (DIFLUCAN) 100 MG tablet Take 1 tablet (100 mg total) by mouth daily. 7 tablet 0   fluticasone (FLONASE) 50 MCG/ACT nasal spray Place 2 sprays into both nostrils daily as needed for allergies or rhinitis. 48 mL 1   fluticasone-salmeterol (WIXELA INHUB) 250-50 MCG/ACT AEPB Inhale 1 puff into the lungs in the morning and at bedtime. 60 each 11   hydrOXYzine (ATARAX) 10 MG tablet TAKE 1 TABLET BY MOUTH 3 TIMES DAILY AS NEEDED FOR ITCHING. Strength: 10 mg 60 tablet 2   ipratropium-albuterol (DUONEB) 0.5-2.5 (3) MG/3ML SOLN TAKE 3 ML BY NEBULIZATION EVERY 4 HOURS AS NEEDED (WHEEZING). 360 mL 0   ketoconazole (NIZORAL) 2 % cream Apply 1 Application topically daily. 60 g 0   losartan (COZAAR) 25 MG tablet Take 1 tablet (25 mg total) by mouth daily. 90 tablet 3   multivitamin (ONE-A-DAY MEN'S) TABS tablet Take 1 tablet by mouth daily.     nystatin (MYCOSTATIN/NYSTOP) powder Use as directed twice per day as needed to the affected area 60 g 2   omeprazole (PRILOSEC) 20 MG capsule TAKE 1 CAPSULE BY MOUTH TWICE A DAY 180 capsule 1   Respiratory Therapy Supplies (FULL KIT NEBULIZER SET) MISC 1 each by Does not apply route as needed. 1 each 0   silver sulfADIAZINE (SILVADENE) 1 % cream APPLY TOPICALLY TO AFFECTED AREA EVERY DAY 50 g 3    tamsulosin (FLOMAX) 0.4 MG CAPS capsule TAKE 1 CAPSULE BY MOUTH EVERY DAY 90 capsule 3   Tiotropium Bromide Monohydrate (SPIRIVA RESPIMAT) 2.5 MCG/ACT AERS Inhale 2 puffs into the lungs daily.     tiZANidine (ZANAFLEX) 2 MG tablet Take 1 tablet (2 mg total) by mouth 3 (three) times daily. 90 tablet 2   torsemide (DEMADEX) 20 MG tablet Take 4 tablets (80 mg total) by mouth 2 (two) times daily. 240 tablet 2   traMADol (ULTRAM) 50 MG tablet Take 1 tablet (50 mg total) by mouth daily as needed. 30 tablet 5   traZODone (DESYREL) 100 MG tablet TAKE 2 TABLETS BY MOUTH AT BEDTIME AS NEEDED 180 tablet 1   TUMS 500 MG chewable tablet Chew 500-2,000 mg by mouth every 4 (four) hours as needed for indigestion or heartburn.     venlafaxine XR (EFFEXOR-XR) 150 MG 24 hr capsule TAKE 1 CAPSULE BY MOUTH DAILY WITH BREAKFAST. 90 capsule 3   zolpidem (AMBIEN) 10 MG tablet Take 10 mg by mouth at bedtime.     eplerenone (INSPRA) 25 MG tablet Take 2 tablets (50 mg total) by mouth daily. 90 tablet 8   potassium chloride SA (KLOR-CON M20) 20 MEQ tablet Take 1 tablet (20 mEq total) by mouth daily. TAKE (2 TABLETS) IN THE MORNING 60 tablet 3   predniSONE (DELTASONE) 10 MG tablet TAKE 1 TABLET (10 MG TOTAL) BY MOUTH DAILY WITH BREAKFAST. (Patient not taking: Reported on 09/04/2023) 30 tablet 0   No current facility-administered medications for this encounter.   Wt Readings from Last  3 Encounters:  09/04/23 129.3 kg (285 lb)  08/14/23 127 kg (280 lb)  07/01/23 129.3 kg (285 lb)   BP 108/70   Pulse 70   Wt 129.3 kg (285 lb)   SpO2 95%   BMI 40.89 kg/m   PHYSICAL EXAM: General: NAD Neck: No JVD, no thyromegaly or thyroid nodule.  Lungs: Distant BS.  CV: Nondisplaced PMI.  Heart regular S1/S2, no S3/S4, no murmur.  No peripheral edema.  No carotid bruit.  Normal pedal pulses.  Abdomen: Soft, nontender, no hepatosplenomegaly, no distention.  Skin: Intact without lesions or rashes.  Neurologic: Alert and  oriented x 3.  Psych: Normal affect. Extremities: No clubbing or cyanosis.  HEENT: Normal.   Assessment/Plan:  1. Atrial fibrillation: Permanent.  Now s/p AV nodal ablation to allow effective BiV pacing.  - Continue Eliquis 5 mg bid.  2. COPD: No longer smoking.  Not using oxygen. I think that COPD plays a major role in his dyspnea.  - followed by Dr. Everardo All  3. Chronic systolic CHF: Nonischemic cardiomyopathy based on 6/17 cath. However, he had a recent significant fall in EF from 30-35% to 10-15% on echo from 3/18.  LHC/RHC in 4/18 showed nonobstructive CAD and relatively optimized filling pressures.  He now has Medtronic CRT-D device with AV nodal ablation to allow BiV pacing.  Echo 6/19 with EF 25-30% with mildly decreased RV systolic function. CPX 7/19 was submaximal but likely only mild functional limitation from CHF.  Echo in 1/21 showed EF 35-40%, echo in 3/22 and again in 1/24 showed EF up to 40% with mildly decreased RV systolic function.  NYHA class III, confounded by body habitus, COPD, and physical deconditioning. He is not volume overloaded by exam or Optivol.  - Continue torsemide 80 mg bid.  - Continue losartan 25 mg daily. Did not tolerate Entresto due to AKI and hypotension  - Increase eplerenone to 50 mg daily and decrease KCl to 20 daily. BMET/BNP today and in 10 days.  - Continue Coreg 6.25 mg bid - Off Farxiga due to severe yeast infection.  - Echo in 1/25.  4. CAD: Nonobstructive on 4/18 cath.  No exertional chest pain, rare atypical chest pain.  - No ASA with Eliquis use. - Statin myalgias, continue Repatha. Good LDL in 9/24.  5. Complete heart block: Now has Medtronic CRT-D device and s/p AV nodal ablation.  6. OSA: Unable to tolerate CPAP.    Followup with APP in 2 months.    Signed, Marca Ancona, MD  09/05/2023  [facility] [address] [facility] [place] [address] [phone] (office) [phone]  (fax)

## 2023-09-09 ENCOUNTER — Other Ambulatory Visit (HOSPITAL_COMMUNITY): Payer: Self-pay

## 2023-09-10 ENCOUNTER — Encounter: Payer: Self-pay | Admitting: Internal Medicine

## 2023-09-10 ENCOUNTER — Ambulatory Visit (HOSPITAL_BASED_OUTPATIENT_CLINIC_OR_DEPARTMENT_OTHER): Payer: Medicare HMO | Admitting: Pulmonary Disease

## 2023-09-10 ENCOUNTER — Encounter (HOSPITAL_BASED_OUTPATIENT_CLINIC_OR_DEPARTMENT_OTHER): Payer: Self-pay | Admitting: Pulmonary Disease

## 2023-09-10 VITALS — BP 92/58 | HR 75 | Resp 20 | Ht 70.0 in | Wt 280.0 lb

## 2023-09-10 DIAGNOSIS — J4489 Other specified chronic obstructive pulmonary disease: Secondary | ICD-10-CM

## 2023-09-10 DIAGNOSIS — J449 Chronic obstructive pulmonary disease, unspecified: Secondary | ICD-10-CM

## 2023-09-10 DIAGNOSIS — G4733 Obstructive sleep apnea (adult) (pediatric): Secondary | ICD-10-CM

## 2023-09-10 MED ORDER — SPIRIVA RESPIMAT 2.5 MCG/ACT IN AERS: 2.0000 | INHALATION_SPRAY | Freq: Every day | RESPIRATORY_TRACT | 11 refills | Status: DC

## 2023-09-10 NOTE — Progress Notes (Signed)
Subjective:   PATIENT ID: [name] GENDER: male DOB: August 11, 1956, MRN: [id number]  Chief Complaint  Patient presents with   Follow-up    Not doing good.     Reason for Visit: Follow-up  [name] is a 66 year old male former smoker with COPD, childhood asthma, OSA not on CPAP, DM2, HTN, atrial fib on eliquis, NICM s/p AICD, chronic diastolic heart failure who presents for follow-up.  He was recently treated for COPD exacerbation on 09/24/22. Treated with augmentin and doxycycline. On duonebs and albuterol. Referred to Pulmonary. Cough and wheezing has improved. He reports shortness of breath with walking a few steps. Denies chronic cough. Has baseline wheezing. Albuterol usually controls his symptoms. Currently on Dupixent which is managed by his dermatologist. Has been on it for two years and feels this helps his breathing. Before the pandemic [facility] [place] [facility] [place] [facility].   12/31/22 Since our last visit he has had worsening shortness of breath associated with exertional wheezing. Uses nebulizer twice a day. Compliant with Trelegy daily. Congestion has worsened with the pollen. Reports improved cough and wheezing overall but still persistent. Denies recent fevers, chills. He was treated for RLE cellulitis last month but also treated for heart failure while inpatient.   02/13/23 Since our last visit he was treated for exacerbation and feeling better after steroids. Continues to have shortness of breath, cough and occasional wheezing. He is not on Trelegy and using Duonebs twice a day. He uses albuterol twice a day on average. He initially did not want to walk or be evaluated for oxygen needs due to concern for price and not wanting to wear oxygen. Unable to afford Dupixent and does not qualify for assistance per patient with Allergy.   06/13/23 Since our last visit he reports worsening shortness of breath. Stays at home and minimizes activity. Limited due  to knee issues and LLE wound. When he exerts himself he feels weak, shaky and nauseated. Baseline cough and occasional wheezing unchanged. Complains of right sided neck swelling for >1 week and feels like it is affecting his breathing. Denies fevers or chills. Denies problems swallowing. Does have a preference to lean head to the right side  09/10/23 Since our last visit he has unchanged shortness of breath but has had chronic neck pain for months that causes his neck to be positioned poorly while sleeping. Has cough or wheezing with activity. Compliant with wixela and spiriva. No respiratory infections since our last visit.   Social History: Quit in 2009. Former smoker 60 pack -years   Past Medical History:  Diagnosis Date   AICD (automatic cardioverter/defibrillator) present    Anemia    supposed to be taking Vit B but doesn't   ANXIETY    takes Xanax nightly   Arthritis    Asthma    Albuterol prn and Advair daily;also takes Prednisone daily   Cardiomyopathy (HCC)    a. EF 25% TEE July 2013; b. EF normalized 2015;  c. 03/2015 Echo: EF 40-45%, difrf HK, PASP 38 mmHg, Mild MR, sev LAE/RAE.   Chronic constipation    takes OTC stool softener   COPD (chronic obstructive pulmonary disease) (HCC)    "one dr says COPD; one dr says emphysema" (09/18/2017)   COVID-19 12/09/2019   DEPRESSION    takes Zoloft and Doxepin daily   Diverticulitis    DYSKINESIA, ESOPHAGUS    Essential hypertension  FIBROMYALGIA    GERD (gastroesophageal reflux disease)        Glaucoma    HYPERLIPIDEMIA    a. Intolerant to statins.   INSOMNIA    takes Ambien nightly   Myocardial infarction [facility])    a. 2012 Myoview notable for prior infarct;  b. 03/2015 Lexiscan CL: EF 37%, diff HK, small area of inferior infarct from apex to base-->Med Rx.   O2 dependent    "2.5L q hs & prn" (09/18/2017)   Paroxysmal atrial fibrillation (HCC)    a. CHA2DS2VASc = 3--> takes Coumadin;  b. 03/15/2015 Successful TEE/DCCV;   c. 03/2015 recurrent afib, Amio d/c'd in setting of hyperthyroidism.   Peripheral neuropathy    Pneumonia 12/2016   Rash and other nonspecific skin eruption 04/12/2009   no cause found saw dermatologists x 2 and allergist   SLEEP APNEA, OBSTRUCTIVE    a. doesn't use CPAP   Syncope    a. 03/2015 s/p MDT LINQ.   Type II diabetes mellitus (HCC)          Family History  Problem Relation Age of Onset   COPD Mother    Asthma Mother    Colon polyps Mother    Allergies Mother    Hypothyroidism Mother    Asthma Maternal Grandmother    Colon cancer Neg Hx      Social History   Occupational History   Occupation: retired/disabled. prev worked in Teacher, English as a foreign language.    Employer: DISABLED  Tobacco Use   Smoking status: Former    Current packs/day: 0.00    Average packs/day: 2.0 packs/day for 30.0 years (60.0 ttl pk-yrs)    Types: Cigarettes    Start date: 10/15/1977    Quit date: 10/16/2007    Years since quitting: 15.9   Smokeless tobacco: Never  Vaping Use   Vaping status: Never Used  Substance and Sexual Activity   Alcohol use: No   Drug use: No   Sexual activity: Not Currently    Allergies  Allergen Reactions   Tape Other (See Comments)    SKIN TEARS VERY, VERY EASILY!!!! Use Paper Tape Only, PLEASE!!!   Amiodarone Other (See Comments)    Caused hyperthyroidism    Statins Other (See Comments)    Myalgias    Sulfa Antibiotics Other (See Comments)    Mouth ulcers   Wound Dressing Adhesive Other (See Comments)    Skin Tears Use Paper Tape Only     Outpatient Medications Prior to Visit  Medication Sig Dispense Refill   acetaminophen (TYLENOL) 500 MG tablet Take 500-1,000 mg by mouth every 6 (six) hours as needed for mild pain or headache.     albuterol (VENTOLIN HFA) 108 (90 Base) MCG/ACT inhaler Inhale 1-2 puffs into the lungs every 6 (six) hours as needed for wheezing or shortness of breath. 18 g 5   ALPRAZolam (XANAX) 0.5 MG tablet TAKE 2 TABLETS (1 MG TOTAL) BY MOUTH  EVERY DAY AT BEDTIME 60 tablet 5   ammonium lactate (AMLACTIN) 12 % cream Apply 1 Application topically as needed for dry skin. 385 g 3   apixaban (ELIQUIS) 5 MG TABS tablet Take 1 tablet (5 mg total) by mouth 2 (two) times daily. 60 tablet 11   ascorbic acid (VITAMIN C) 500 MG tablet Take 500 mg by mouth daily.     augmented betamethasone dipropionate (DIPROLENE-AF) 0.05 % cream Apply 1 application  topically daily as needed (to affected areas- for itching).     carvedilol (COREG) 3.125  MG tablet Take 1 tablet (3.125 mg total) by mouth 2 (two) times daily. 180 tablet 1   Cholecalciferol (VITAMIN D3) 1000 units CAPS Take 1,000 Units by mouth daily.     clindamycin (CLEOCIN) 300 MG capsule Take 1 capsule (300 mg total) by mouth 3 (three) times daily. 30 capsule 0   clotrimazole-betamethasone (LOTRISONE) cream Apply 1 Application topically daily. 30 g 0   doxepin (SINEQUAN) 10 MG capsule TAKE 2 CAPSULES BY MOUTH AT BEDTIME AS NEEDED. 180 capsule 1   DULoxetine (CYMBALTA) 60 MG capsule TAKE 1 CAPSULE BY MOUTH EVERY DAY 90 capsule 2   Dupilumab (DUPIXENT) 300 MG/2ML SOPN Inject 300 mg into the skin every 14 (fourteen) days.     eplerenone (INSPRA) 25 MG tablet Take 2 tablets (50 mg total) by mouth daily. 90 tablet 8   Evolocumab (REPATHA SURECLICK) 140 MG/ML SOAJ Inject 140 mg into the skin every 14 (fourteen) days. 6 mL 3   fluconazole (DIFLUCAN) 100 MG tablet Take 1 tablet (100 mg total) by mouth daily. 7 tablet 0   fluticasone (FLONASE) 50 MCG/ACT nasal spray Place 2 sprays into both nostrils daily as needed for allergies or rhinitis. 48 mL 1   fluticasone-salmeterol (WIXELA INHUB) 250-50 MCG/ACT AEPB Inhale 1 puff into the lungs in the morning and at bedtime. 60 each 11   hydrOXYzine (ATARAX) 10 MG tablet TAKE 1 TABLET BY MOUTH 3 TIMES DAILY AS NEEDED FOR ITCHING. Strength: 10 mg 60 tablet 2   ipratropium-albuterol (DUONEB) 0.5-2.5 (3) MG/3ML SOLN TAKE 3 ML BY NEBULIZATION EVERY 4 HOURS AS NEEDED  (WHEEZING). 360 mL 0   ketoconazole (NIZORAL) 2 % cream Apply 1 Application topically daily. 60 g 0   losartan (COZAAR) 25 MG tablet Take 1 tablet (25 mg total) by mouth daily. 90 tablet 3   multivitamin (ONE-A-DAY MEN'S) TABS tablet Take 1 tablet by mouth daily.     nystatin (MYCOSTATIN/NYSTOP) powder Use as directed twice per day as needed to the affected area 60 g 2   omeprazole (PRILOSEC) 20 MG capsule TAKE 1 CAPSULE BY MOUTH TWICE A DAY 180 capsule 1   potassium chloride SA (KLOR-CON M20) 20 MEQ tablet Take 1 tablet (20 mEq total) by mouth daily. TAKE (2 TABLETS) IN THE MORNING 60 tablet 3   predniSONE (DELTASONE) 10 MG tablet TAKE 1 TABLET (10 MG TOTAL) BY MOUTH DAILY WITH BREAKFAST. 30 tablet 0   Respiratory Therapy Supplies (FULL KIT NEBULIZER SET) MISC 1 each by Does not apply route as needed. 1 each 0   silver sulfADIAZINE (SILVADENE) 1 % cream APPLY TOPICALLY TO AFFECTED AREA EVERY DAY 50 g 3   tamsulosin (FLOMAX) 0.4 MG CAPS capsule TAKE 1 CAPSULE BY MOUTH EVERY DAY 90 capsule 3   Tiotropium Bromide Monohydrate (SPIRIVA RESPIMAT) 2.5 MCG/ACT AERS Inhale 2 puffs into the lungs daily.     tiZANidine (ZANAFLEX) 2 MG tablet Take 1 tablet (2 mg total) by mouth 3 (three) times daily. 90 tablet 2   torsemide (DEMADEX) 20 MG tablet Take 4 tablets (80 mg total) by mouth 2 (two) times daily. 240 tablet 2   traMADol (ULTRAM) 50 MG tablet Take 1 tablet (50 mg total) by mouth daily as needed. 30 tablet 5   traZODone (DESYREL) 100 MG tablet TAKE 2 TABLETS BY MOUTH AT BEDTIME AS NEEDED 180 tablet 1   TUMS 500 MG chewable tablet Chew 500-2,000 mg by mouth every 4 (four) hours as needed for indigestion or heartburn.  venlafaxine XR (EFFEXOR-XR) 150 MG 24 hr capsule TAKE 1 CAPSULE BY MOUTH DAILY WITH BREAKFAST. 90 capsule 3   zolpidem (AMBIEN) 10 MG tablet Take 10 mg by mouth at bedtime.     No facility-administered medications prior to visit.    Review of Systems  Constitutional:   Negative for chills, diaphoresis, fever, malaise/fatigue and weight loss.  HENT:  Negative for congestion.   Respiratory:  Positive for shortness of breath. Negative for cough, hemoptysis, sputum production and wheezing.   Cardiovascular:  Negative for chest pain, palpitations and leg swelling.  Musculoskeletal:  Positive for neck pain.     Objective:   Vitals:   09/10/23 1459  BP: (!) 92/58  Pulse: 75  Resp: 20  SpO2: 93%  Weight: 280 lb (127 kg)  Height: 5\' 10"  (1.778 m)   SpO2: 93 %  Body mass index is 40.18 kg/m.  Physical Exam: General: Well-appearing, no acute distress HENT: [place], AT Eyes: EOMI, no scleral icterus Respiratory: Diminished but clear to auscultation bilaterally.  No crackles, wheezing or rales Cardiovascular: RRR, -M/R/G, no JVD Extremities:-Edema,-tenderness Neuro: AAO x4, CNII-XII grossly intact Psych: Normal mood, normal affect   Data Reviewed:  Imaging: CXR 05/05/21 - Cardiomegaly. S/p biventricular ICD. Small bilateral pleural effusion CXR 11/29/22 - Cardiomegaly. S/p biventricular ICD. Resolved effusions CXR 01/25/23 - Cardiomegaly. S/p biventricular ICD. Bibasilar atelectasis  PFT: 01/27/14 FVC 3.35 (72%) FEV1 2.41 (68%) Ratio 65  TLC 94% DLCO 77% Interpretation: Moderate obstructive defect with mildly reduced DLCO. Borderline bronchodilator response in FEV1 present.  Labs: CBC    Component Value Date/Time   WBC 10.7 (H) 07/01/2023 1439   RBC 4.07 (L) 07/01/2023 1439   HGB 12.9 (L) 07/01/2023 1439   HGB 12.8 (L) 12/19/2017 1532   HCT 40.9 07/01/2023 1439   HCT 38.0 12/19/2017 1532   PLT 271.0 07/01/2023 1439   PLT 208 12/19/2017 1532   MCV 100.4 (H) 07/01/2023 1439   MCV 99 (H) 12/19/2017 1532   MCH 32.3 05/15/2023 0149   MCHC 31.5 07/01/2023 1439   RDW 13.9 07/01/2023 1439   RDW 14.0 12/19/2017 1532   LYMPHSABS 2.4 07/01/2023 1439   LYMPHSABS 1.9 12/19/2017 1532   MONOABS 1.0 07/01/2023 1439   EOSABS 0.6 07/01/2023 1439    EOSABS 0.3 12/19/2017 1532   BASOSABS 0.1 07/01/2023 1439   BASOSABS 0.0 12/19/2017 1532   BMET    Component Value Date/Time   NA 140 09/04/2023 1541   NA 141 01/25/2023 1358   K 4.0 09/04/2023 1541   CL 101 09/04/2023 1541   CO2 29 09/04/2023 1541   GLUCOSE 83 09/04/2023 1541   BUN 26 (H) 09/04/2023 1541   BUN 41 (H) 01/25/2023 1358   CREATININE 1.09 09/04/2023 1541   CREATININE 0.92 03/19/2016 1420   CALCIUM 9.4 09/04/2023 1541   EGFR 56 (L) 01/25/2023 1358   GFRNONAA >60 09/04/2023 1541     Abs eos 12/20/21 -100  HST showed mild  OSA with AHI 11/ hr with low saturation of 66%     Assessment & Plan:   Discussion: 67 year old male former smoker with COPD, childhood asthma, OSA not on CPAP, DM2, HTN, atrial fib on eliquis, NICM s/p AICD, chronic diastolic heart failure who presents for follow-up. DOE is likely related to related to deconditioning, uncontrolled OSA/nocturnal hypoxemia and inadequately treated COPD. Discussed clinical course and management of COPD/asthma including optimizing bronchodilator regimen and action plan for exacerbation. Add LAMA to his regimen.  Counseled on benefit  of oxygen support and CPAP if he qualifies. ONO and sleep test ordered   COPD-asthma overlap - not in exacerbation. Uncontrolled symptoms --CONTINUE Wixela 250-50 mcg ONE puff in the morning and evening --CONTINUE Spiriva 2.5 mcg TWO puffs in the morning --CONTINUE Albuterol AS NEEDED for shortness of breath or wheezing --Encouraged aerobic exercise 30 minutes five days a week  History of OSA Hypoxemia Last sleep test before 2013 Prior ambulatory O2 on last visit with no desaturations --06/25/23 home sleep study with mild OSA --06/20/23 overnight oximetry - Does not qualify for oxygen. Will reconsider retesting at next visit --Patient declined CPAP --Discussed weight loss   Health Maintenance Immunization History  Administered Date(s) Administered   Fluad Quad(high Dose 65+)  06/29/2022   Fluad Trivalent(High Dose 65+) 07/01/2023   Influenza Inj Mdck Quad With Preservative 07/22/2019   Influenza Split 07/09/2011, 07/23/2012   Influenza Whole 09/16/2006, 08/26/2007, 07/27/2008, 07/26/2009, 07/07/2010   Influenza, High Dose Seasonal PF 06/22/2020, 07/07/2021   Influenza,inj,Quad PF,6+ Mos 06/30/2013, 08/19/2014, 06/27/2015, 07/13/2016, 07/09/2017, 07/18/2018   PFIZER(Purple Top)SARS-COV-2 Vaccination 03/21/2020, 04/11/2020, 11/03/2020   Pneumococcal Conjugate-13 04/29/2017   Pneumococcal Polysaccharide-23 03/29/2010, 07/07/2010, 02/23/2015, 03/17/2021   Td 10/16/2003   Tdap 02/23/2015   CT Lung Screen - not qualified. Quit > 15 years ago  No orders of the defined types were placed in this encounter.  Meds ordered this encounter  Medications   Tiotropium Bromide Monohydrate (SPIRIVA RESPIMAT) 2.5 MCG/ACT AERS    Sig: Inhale 2 puffs into the lungs daily.    Dispense:  4 g    Refill:  11    Order Specific Question:   Lot Number?    Answer:   [id number]    Order Specific Question:   Expiration Date?    Answer:   11/14/2024    Order Specific Question:   NDC    Answer:   0454-0981-19 [[id number]]    Order Specific Question:   Quantity    Answer:   2    Return in about 3 months (around 12/11/2023).  I have spent a total time of 30-minutes on the day of the appointment including chart review, data review, collecting history, coordinating care and discussing medical diagnosis and plan with the patient/family. Past medical history, allergies, medications were reviewed. Pertinent imaging, labs and tests included in this note have been reviewed and interpreted independently by me.  Jakevion Arney Mechele Collin, MD [facility] 09/10/2023 3:14 PM  Office Number [phone]

## 2023-09-10 NOTE — Patient Instructions (Signed)
  COPD-asthma overlap - not in exacerbation. Uncontrolled symptoms --CONTINUE Wixela 250-50 mcg ONE puff in the morning and evening --CONTINUE Spiriva 2.5 mcg TWO puffs in the morning --CONTINUE Albuterol AS NEEDED for shortness of breath or wheezing --Encouraged aerobic exercise 30 minutes five days a week  History of OSA Hypoxemia Last sleep test before 2013 Prior ambulatory O2 on last visit with no desaturations --06/25/23 home sleep study with mild OSA --06/20/23 overnight oximetry - Does not qualify for oxygen. Will reconsider retesting at next visit --Patient declined CPAP --Discussed weight loss

## 2023-09-11 MED ORDER — TRAMADOL HCL 50 MG PO TABS: 50.0000 mg | ORAL_TABLET | Freq: Three times a day (TID) | ORAL | 2 refills | Status: DC | PRN

## 2023-09-16 ENCOUNTER — Telehealth: Payer: Self-pay

## 2023-09-16 ENCOUNTER — Ambulatory Visit: Payer: Medicare HMO | Attending: Internal Medicine

## 2023-09-16 ENCOUNTER — Ambulatory Visit (INDEPENDENT_AMBULATORY_CARE_PROVIDER_SITE_OTHER): Payer: Medicare HMO

## 2023-09-16 VITALS — Ht 70.0 in | Wt 280.0 lb

## 2023-09-16 DIAGNOSIS — Z0000 Encounter for general adult medical examination without abnormal findings: Secondary | ICD-10-CM

## 2023-09-16 DIAGNOSIS — I5022 Chronic systolic (congestive) heart failure: Secondary | ICD-10-CM | POA: Diagnosis not present

## 2023-09-16 DIAGNOSIS — Z95810 Presence of automatic (implantable) cardiac defibrillator: Secondary | ICD-10-CM

## 2023-09-16 MED ORDER — VALACYCLOVIR HCL 1 G PO TABS: 1000.0000 mg | ORAL_TABLET | Freq: Three times a day (TID) | ORAL | 3 refills | Status: DC

## 2023-09-16 NOTE — Progress Notes (Signed)
Subjective:   [name] is a 67 y.o. male who presents for Medicare Annual/Subsequent preventive examination.  Visit Complete: Virtual I connected with  [name] on 09/16/23 by a audio enabled telemedicine application and verified that I am speaking with the correct person using two identifiers.  Patient Location: Home    Provider Location: Office/Clinic  I discussed the limitations of evaluation and management by telemedicine. The patient expressed understanding and agreed to proceed.  Vital Signs: Because this visit was a virtual/telehealth visit, some criteria may be missing or patient reported. Any vitals not documented were not able to be obtained and vitals that have been documented are patient reported.   Cardiac Risk Factors include: advanced age (>36men, >31 women);male gender;Other (see comment);diabetes mellitus;obesity (BMI >30kg/m2), Risk factor comments: HFrEF, A-Fib, OSA, COPD     Objective:    Today's Vitals   09/16/23 1521 09/16/23 1529  Weight: 280 lb (127 kg)   Height: 5\' 10"  (1.778 m)   PainSc:  8    Body mass index is 40.18 kg/m.     09/16/2023    3:38 PM 05/14/2023    2:34 AM 01/25/2023    5:20 PM 11/20/2022    6:00 PM 06/29/2022    1:12 PM 11/02/2021    1:03 PM 10/17/2021    8:29 AM  Advanced Directives  Does Patient Have a Medical Advance Directive? No No  No Yes Yes No  Type of Advance Directive   Living will  Healthcare Power of [place];Living will Healthcare Power of [place];Living will   Copy of Healthcare Power of Attorney in Chart?     No - copy requested    Would patient like information on creating a medical advance directive?  No - Patient declined  No - Patient declined   No - Patient declined    Current Medications (verified) Outpatient Encounter Medications as of 09/16/2023  Medication Sig   acetaminophen (TYLENOL) 500 MG tablet Take 500-1,000 mg by mouth every 6 (six) hours as needed for mild pain or headache.   albuterol  (VENTOLIN HFA) 108 (90 Base) MCG/ACT inhaler Inhale 1-2 puffs into the lungs every 6 (six) hours as needed for wheezing or shortness of breath.   ALPRAZolam (XANAX) 0.5 MG tablet TAKE 2 TABLETS (1 MG TOTAL) BY MOUTH EVERY DAY AT BEDTIME   ammonium lactate (AMLACTIN) 12 % cream Apply 1 Application topically as needed for dry skin.   apixaban (ELIQUIS) 5 MG TABS tablet Take 1 tablet (5 mg total) by mouth 2 (two) times daily.   ascorbic acid (VITAMIN C) 500 MG tablet Take 500 mg by mouth daily.   augmented betamethasone dipropionate (DIPROLENE-AF) 0.05 % cream Apply 1 application  topically daily as needed (to affected areas- for itching).   carvedilol (COREG) 3.125 MG tablet Take 1 tablet (3.125 mg total) by mouth 2 (two) times daily.   Cholecalciferol (VITAMIN D3) 1000 units CAPS Take 1,000 Units by mouth daily.   clindamycin (CLEOCIN) 300 MG capsule Take 1 capsule (300 mg total) by mouth 3 (three) times daily.   clotrimazole-betamethasone (LOTRISONE) cream Apply 1 Application topically daily.   doxepin (SINEQUAN) 10 MG capsule TAKE 2 CAPSULES BY MOUTH AT BEDTIME AS NEEDED.   DULoxetine (CYMBALTA) 60 MG capsule TAKE 1 CAPSULE BY MOUTH EVERY DAY   Dupilumab (DUPIXENT) 300 MG/2ML SOPN Inject 300 mg into the skin every 14 (fourteen) days.   eplerenone (INSPRA) 25 MG tablet Take 2 tablets (50 mg total) by mouth daily.  Evolocumab (REPATHA SURECLICK) 140 MG/ML SOAJ Inject 140 mg into the skin every 14 (fourteen) days.   fluconazole (DIFLUCAN) 100 MG tablet Take 1 tablet (100 mg total) by mouth daily.   fluticasone (FLONASE) 50 MCG/ACT nasal spray Place 2 sprays into both nostrils daily as needed for allergies or rhinitis.   fluticasone-salmeterol (WIXELA INHUB) 250-50 MCG/ACT AEPB Inhale 1 puff into the lungs in the morning and at bedtime.   hydrOXYzine (ATARAX) 10 MG tablet TAKE 1 TABLET BY MOUTH 3 TIMES DAILY AS NEEDED FOR ITCHING. Strength: 10 mg   ipratropium-albuterol (DUONEB) 0.5-2.5 (3) MG/3ML  SOLN TAKE 3 ML BY NEBULIZATION EVERY 4 HOURS AS NEEDED (WHEEZING).   ketoconazole (NIZORAL) 2 % cream Apply 1 Application topically daily.   losartan (COZAAR) 25 MG tablet Take 1 tablet (25 mg total) by mouth daily.   multivitamin (ONE-A-DAY MEN'S) TABS tablet Take 1 tablet by mouth daily.   nystatin (MYCOSTATIN/NYSTOP) powder Use as directed twice per day as needed to the affected area   omeprazole (PRILOSEC) 20 MG capsule TAKE 1 CAPSULE BY MOUTH TWICE A DAY   potassium chloride SA (KLOR-CON M20) 20 MEQ tablet Take 1 tablet (20 mEq total) by mouth daily. TAKE (2 TABLETS) IN THE MORNING   predniSONE (DELTASONE) 10 MG tablet TAKE 1 TABLET (10 MG TOTAL) BY MOUTH DAILY WITH BREAKFAST.   Respiratory Therapy Supplies (FULL KIT NEBULIZER SET) MISC 1 each by Does not apply route as needed.   silver sulfADIAZINE (SILVADENE) 1 % cream APPLY TOPICALLY TO AFFECTED AREA EVERY DAY   tamsulosin (FLOMAX) 0.4 MG CAPS capsule TAKE 1 CAPSULE BY MOUTH EVERY DAY   Tiotropium Bromide Monohydrate (SPIRIVA RESPIMAT) 2.5 MCG/ACT AERS Inhale 2 puffs into the lungs daily.   tiZANidine (ZANAFLEX) 2 MG tablet Take 1 tablet (2 mg total) by mouth 3 (three) times daily.   torsemide (DEMADEX) 20 MG tablet Take 4 tablets (80 mg total) by mouth 2 (two) times daily.   traMADol (ULTRAM) 50 MG tablet Take 1 tablet (50 mg total) by mouth every 8 (eight) hours as needed.   traZODone (DESYREL) 100 MG tablet TAKE 2 TABLETS BY MOUTH AT BEDTIME AS NEEDED   TUMS 500 MG chewable tablet Chew 500-2,000 mg by mouth every 4 (four) hours as needed for indigestion or heartburn.   venlafaxine XR (EFFEXOR-XR) 150 MG 24 hr capsule TAKE 1 CAPSULE BY MOUTH DAILY WITH BREAKFAST.   zolpidem (AMBIEN) 10 MG tablet Take 10 mg by mouth at bedtime.   No facility-administered encounter medications on file as of 09/16/2023.    Allergies (verified) Tape, Amiodarone, Statins, Sulfa antibiotics, and Wound dressing adhesive   History: Past Medical  History:  Diagnosis Date   AICD (automatic cardioverter/defibrillator) present    Anemia    supposed to be taking Vit B but doesn't   ANXIETY    takes Xanax nightly   Arthritis    Asthma    Albuterol prn and Advair daily;also takes Prednisone daily   Cardiomyopathy (HCC)    a. EF 25% TEE July 2013; b. EF normalized 2015;  c. 03/2015 Echo: EF 40-45%, difrf HK, PASP 38 mmHg, Mild MR, sev LAE/RAE.   Chronic constipation    takes OTC stool softener   COPD (chronic obstructive pulmonary disease) (HCC)    "one dr says COPD; one dr says emphysema" (09/18/2017)   COVID-19 12/09/2019   DEPRESSION    takes Zoloft and Doxepin daily   Diverticulitis    DYSKINESIA, ESOPHAGUS    Essential  hypertension        FIBROMYALGIA    GERD (gastroesophageal reflux disease)        Glaucoma    HYPERLIPIDEMIA    a. Intolerant to statins.   INSOMNIA    takes Ambien nightly   Myocardial infarction [facility])    a. 2012 Myoview notable for prior infarct;  b. 03/2015 Lexiscan CL: EF 37%, diff HK, small area of inferior infarct from apex to base-->Med Rx.   O2 dependent    "2.5L q hs & prn" (09/18/2017)   Paroxysmal atrial fibrillation (HCC)    a. CHA2DS2VASc = 3--> takes Coumadin;  b. 03/15/2015 Successful TEE/DCCV;  c. 03/2015 recurrent afib, Amio d/c'd in setting of hyperthyroidism.   Peripheral neuropathy    Pneumonia 12/2016   Rash and other nonspecific skin eruption 04/12/2009   no cause found saw dermatologists x 2 and allergist   SLEEP APNEA, OBSTRUCTIVE    a. doesn't use CPAP   Syncope    a. 03/2015 s/p MDT LINQ.   Type II diabetes mellitus (HCC)        Past Surgical History:  Procedure Laterality Date   ACNE CYST REMOVAL     2 on back    AV NODE ABLATION N/A 10/25/2017   Procedure: AV NODE ABLATION;  Surgeon: Duke Salvia, MD;  Location: [facility] INVASIVE CV LAB;  Service: Cardiovascular;  Laterality: N/A;   BIV ICD INSERTION CRT-D N/A 09/18/2017   Procedure: BIV ICD INSERTION CRT-D;  Surgeon: Duke Salvia, MD;  Location: [facility] INVASIVE CV LAB;  Service: Cardiovascular;  Laterality: N/A;   CARDIAC CATHETERIZATION N/A 03/21/2016   Procedure: Right/Left Heart Cath and Coronary Angiography;  Surgeon: Laurey Morale, MD;  Location: [facility] INVASIVE CV LAB;  Service: Cardiovascular;  Laterality: N/A;   CARDIOVERSION  04/18/2012   Procedure: CARDIOVERSION;  Surgeon: Pricilla Riffle, MD;  Location: [facility] OR;  Service: Cardiovascular;  Laterality: N/A;   CARDIOVERSION  04/25/2012   Procedure: CARDIOVERSION;  Surgeon: Vesta Mixer, MD;  Location: [facility] ENDOSCOPY;  Service: Cardiovascular;  Laterality: N/A;   CARDIOVERSION  04/25/2012   Procedure: CARDIOVERSION;  Surgeon: Pricilla Riffle, MD;  Location: [facility] OR;  Service: Cardiovascular;  Laterality: N/A;   CARDIOVERSION  05/09/2012   Procedure: CARDIOVERSION;  Surgeon: Tonny Bollman, MD;  Location: [facility] OR;  Service: Cardiovascular;  Laterality: N/A;  changed from crenshaw to cooper by trish/leone-endo   CARDIOVERSION N/A 03/15/2015   Procedure: CARDIOVERSION;  Surgeon: Vesta Mixer, MD;  Location: [facility] ENDOSCOPY;  Service: Cardiovascular;  Laterality: N/A;   COLONOSCOPY     COLONOSCOPY WITH PROPOFOL N/A 10/21/2014   Procedure: COLONOSCOPY WITH PROPOFOL;  Surgeon: Meryl Dare, MD;  Location: [facility] ENDOSCOPY;  Service: Endoscopy;  Laterality: N/A;   EP IMPLANTABLE DEVICE N/A 04/06/2015   Procedure: Loop Recorder Insertion;  Surgeon: Marinus Maw, MD;  Location: [facility] INVASIVE CV LAB;  Service: Cardiovascular;  Laterality: N/A;   ESOPHAGOGASTRODUODENOSCOPY     JOINT REPLACEMENT     LOOP RECORDER REMOVAL N/A 09/18/2017   Procedure: LOOP RECORDER REMOVAL;  Surgeon: Duke Salvia, MD;  Location: [facility] INVASIVE CV LAB;  Service: Cardiovascular;  Laterality: N/A;   RIGHT/LEFT HEART CATH AND CORONARY ANGIOGRAPHY N/A 01/28/2017   Procedure: Right/Left Heart Cath and Coronary Angiography;  Surgeon: Laurey Morale, MD;  Location: [facility] INVASIVE CV LAB;  Service: Cardiovascular;   Laterality: N/A;   TEE WITHOUT CARDIOVERSION  04/25/2012   Procedure: TRANSESOPHAGEAL ECHOCARDIOGRAM (TEE);  Surgeon: Deloris Ping  Nahser, MD;  Location: [facility] ENDOSCOPY;  Service: Cardiovascular;  Laterality: N/A;   TEE WITHOUT CARDIOVERSION N/A 03/15/2015   Procedure: TRANSESOPHAGEAL ECHOCARDIOGRAM (TEE);  Surgeon: Vesta Mixer, MD;  Location: [facility] ENDOSCOPY;  Service: Cardiovascular;  Laterality: N/A;   TONSILLECTOMY AND ADENOIDECTOMY     TOTAL KNEE ARTHROPLASTY Right 06/15/2014   Procedure: TOTAL KNEE ARTHROPLASTY;  Surgeon: Sheral Apley, MD;  Location: [facility] OR;  Service: Orthopedics;  Laterality: Right;   TOTAL KNEE ARTHROPLASTY Left 10/17/2021   Procedure: Left TOTAL KNEE ARTHROPLASTY;  Surgeon: Kathryne Hitch, MD;  Location: [facility] OR;  Service: Orthopedics;  Laterality: Left;   Family History  Problem Relation Age of Onset   COPD Mother    Asthma Mother    Colon polyps Mother    Allergies Mother    Hypothyroidism Mother    Asthma Maternal Grandmother    Colon cancer Neg Hx    Social History   Socioeconomic History   Marital status: Significant Other    Spouse name: Not on file   Number of children: 2   Years of education: Not on file   Highest education level: Not on file  Occupational History   Occupation: retired/disabled. prev worked in Teacher, English as a foreign language.    Employer: DISABLED  Tobacco Use   Smoking status: Former    Current packs/day: 0.00    Average packs/day: 2.0 packs/day for 30.0 years (60.0 ttl pk-yrs)    Types: Cigarettes    Start date: 10/15/1977    Quit date: 10/16/2007    Years since quitting: 15.9   Smokeless tobacco: Never  Vaping Use   Vaping status: Never Used  Substance and Sexual Activity   Alcohol use: No   Drug use: No   Sexual activity: Not Currently  Other Topics Concern   Not on file  Social History Narrative   Lives with girlfriend   Social Determinants of Health   Financial Resource Strain: Low Risk  (09/16/2023)   Overall Financial  Resource Strain (CARDIA)    Difficulty of Paying Living Expenses: Not hard at all  Food Insecurity: No Food Insecurity (09/16/2023)   Hunger Vital Sign    Worried About Running Out of Food in the Last Year: Never true    Ran Out of Food in the Last Year: Never true  Transportation Needs: No Transportation Needs (09/16/2023)   PRAPARE - Administrator, Civil Service (Medical): No    Lack of Transportation (Non-Medical): No  Physical Activity: Inactive (09/16/2023)   Exercise Vital Sign    Days of Exercise per Week: 0 days    Minutes of Exercise per Session: 0 min  Stress: No Stress Concern Present (09/16/2023)   [employer] of Occupational Health - Occupational Stress Questionnaire    Feeling of Stress : Not at all  Social Connections: Moderately Isolated (09/16/2023)   Social Connection and Isolation Panel [NHANES]    Frequency of Communication with Friends and Family: Once a week    Frequency of Social Gatherings with Friends and Family: Never    Attends Religious Services: 1 to 4 times per year    Active Member of [employer] or Organizations: No    Attends Engineer, structural: Never    Marital Status: Living with partner    Tobacco Counseling Counseling given: Not Answered   Clinical Intake:  Pre-visit preparation completed: Yes  Pain : 0-10 Pain Score: 8  Pain Location: Neck Pain Radiating Towards: Down back Pain Descriptors / Indicators: Aching, Discomfort,  Throbbing, Sharp, Shooting Pain Relieving Factors: Tramadol, ibuprofen, Tylenol  Pain Relieving Factors: Tramadol, ibuprofen, Tylenol  BMI - recorded: 40.18 Nutritional Status: BMI > 30  Obese Nutritional Risks: None Diabetes: Yes CBG done?: No Did pt. bring in CBG monitor from home?: No  How often do you need to have someone help you when you read instructions, pamphlets, or other written materials from your doctor or pharmacy?: 1 - Never  Interpreter Needed?: No  Information entered by  :: Ebin Palazzi, RMA   Activities of Daily Living    09/16/2023    3:35 PM 11/20/2022    6:00 PM  In your present state of health, do you have any difficulty performing the following activities:  Hearing? 0 0  Vision? 0 1  Difficulty concentrating or making decisions? 0 0  Walking or climbing stairs? 0 0  Dressing or bathing? 0 0  Doing errands, shopping? 0 0  Preparing Food and eating ? N   Using the Toilet? N   In the past six months, have you accidently leaked urine? N   Do you have problems with loss of bowel control? N   Managing your Medications? N   Managing your Finances? N   Housekeeping or managing your Housekeeping? N     Patient Care Team: [name], MD as PCP - General (Internal Medicine) Laurey Morale, MD as PCP - Advanced Heart Failure (Cardiology) Duke Salvia, MD as PCP - Electrophysiology (Cardiology) Himmelrich, Loree Fee, RD (Inactive) as Dietitian Duke Salvia, MD as Consulting Physician (Cardiology) Kathryne Hitch, MD as Consulting Physician (Orthopedic Surgery) Levy Pupa, MD as Referring Physician (Ophthalmology)  Indicate any recent Medical Services you may have received from other than [facility] providers in the past year (date may be approximate).     Assessment:   This is a routine wellness examination for [place].  Hearing/Vision screen Hearing Screening - Comments:: Denies hearing difficulties   Vision Screening - Comments:: Wears eyeglasses   Goals Addressed   None   Depression Screen    09/16/2023    3:45 PM 08/14/2023    2:32 PM 07/01/2023    1:27 PM 03/01/2023    2:28 PM 12/28/2022    1:33 PM 12/18/2022   10:06 AM 06/29/2022    1:15 PM  PHQ 2/9 Scores  PHQ - 2 Score 2 0 0 0 0 0 0  PHQ- 9 Score 6    0      Fall Risk    09/16/2023    3:39 PM 08/14/2023    2:32 PM 07/01/2023    1:27 PM 03/01/2023    2:27 PM 12/28/2022    1:45 PM  Fall Risk   Falls in the past year? 1 0 0 0 0  Number falls in past yr: 1  0 0  0  Injury with Fall? 1  0 0 0  Comment broke his neck/ C7      Risk for fall due to :   No Fall Risks No Fall Risks   Follow up Falls evaluation completed;Falls prevention discussed  Falls evaluation completed Falls evaluation completed     MEDICARE RISK AT HOME: Medicare Risk at Home Any stairs in or around the home?: No Home free of loose throw rugs in walkways, pet beds, electrical cords, etc?: Yes Adequate lighting in your home to reduce risk of falls?: Yes Life alert?: No Use of a cane, walker or w/c?: Yes Grab bars in the bathroom?: Yes  Shower chair or bench in shower?: Yes Elevated toilet seat or a handicapped toilet?: Yes  TIMED UP AND GO:  Was the test performed?  No    Cognitive Function:        09/16/2023    3:41 PM 06/29/2022    1:33 PM  6CIT Screen  What Year? 0 points 0 points  What month? 0 points 0 points  What time? 0 points 0 points  Count back from 20 0 points 0 points  Months in reverse 2 points 0 points  Repeat phrase 0 points 0 points  Total Score 2 points 0 points    Immunizations Immunization History  Administered Date(s) Administered   Fluad Quad(high Dose 65+) 06/29/2022   Fluad Trivalent(High Dose 65+) 07/01/2023   Influenza Inj Mdck Quad With Preservative 07/22/2019   Influenza Split 07/09/2011, 07/23/2012   Influenza Whole 09/16/2006, 08/26/2007, 07/27/2008, 07/26/2009, 07/07/2010   Influenza, High Dose Seasonal PF 06/22/2020, 07/07/2021   Influenza,inj,Quad PF,6+ Mos 06/30/2013, 08/19/2014, 06/27/2015, 07/13/2016, 07/09/2017, 07/18/2018   PFIZER(Purple Top)SARS-COV-2 Vaccination 03/21/2020, 04/11/2020, 11/03/2020   Pneumococcal Conjugate-13 04/29/2017   Pneumococcal Polysaccharide-23 03/29/2010, 07/07/2010, 02/23/2015, 03/17/2021   Td 10/16/2003   Tdap 02/23/2015    TDAP status: Up to date  Flu Vaccine status: Up to date  Pneumococcal vaccine status: Up to date  Covid-19 vaccine status: Completed vaccines  Qualifies for  Shingles Vaccine? Yes   Zostavax completed No   Shingrix Completed?: No.    Education has been provided regarding the importance of this vaccine. Patient has been advised to call insurance company to determine out of pocket expense if they have not yet received this vaccine. Advised may also receive vaccine at local pharmacy or Health Dept. Verbalized acceptance and understanding.  Screening Tests Health Maintenance  Topic Date Due   OPHTHALMOLOGY EXAM  10/15/2023 (Originally 01/23/2023)   Colonoscopy  12/28/2023 (Originally 03/06/2020)   FOOT EXAM  12/28/2023   HEMOGLOBIN A1C  12/29/2023   Diabetic kidney evaluation - Urine ACR  06/30/2024   Diabetic kidney evaluation - eGFR measurement  09/03/2024   Medicare Annual Wellness (AWV)  09/15/2024   DTaP/Tdap/Td (3 - Td or Tdap) 02/22/2025   Pneumonia Vaccine 66+ Years old  Completed   INFLUENZA VACCINE  Completed   Hepatitis C Screening  Completed   HPV VACCINES  Aged Out   Lung Cancer Screening  Discontinued   COVID-19 Vaccine  Discontinued   Zoster Vaccines- Shingrix  Discontinued    Health Maintenance  There are no preventive care reminders to display for this patient.   Colorectal cancer screening: Type of screening: Colonoscopy. Completed 03/04/2015. Repeat every 5 years  Lung Cancer Screening: (Low Dose CT Chest recommended if Age 104-80 years, 20 pack-year currently smoking OR have quit w/in 15years.) does not qualify.   Lung Cancer Screening Referral: N/A  Additional Screening:  Hepatitis C Screening: does qualify; Completed 11/29/2015  Vision Screening: Recommended annual ophthalmology exams for early detection of glaucoma and other disorders of the eye. Is the patient up to date with their annual eye exam?  No  Who is the provider or what is the name of the office in which the patient attends annual eye exams? [facility] If pt is not established with a provider, would they like to be referred to a provider to establish  care? No .   Dental Screening: Recommended annual dental exams for proper oral hygiene  Diabetic Foot Exam: Diabetic Foot Exam: Completed 03/158/2024  Community Resource Referral / Chronic  Care Management: CRR required this visit?  No   CCM required this visit?  No     Plan:     I have personally reviewed and noted the following in the patient's chart:   Medical and social history Use of alcohol, tobacco or illicit drugs  Current medications and supplements including opioid prescriptions. Patient is not currently taking opioid prescriptions. Functional ability and status Nutritional status Physical activity Advanced directives List of other physicians Hospitalizations, surgeries, and ER visits in previous 12 months Vitals Screenings to include cognitive, depression, and falls Referrals and appointments  In addition, I have reviewed and discussed with patient certain preventive protocols, quality metrics, and best practice recommendations. A written personalized care plan for preventive services as well as general preventive health recommendations were provided to patient.     Nica Friske L Marsha Hillman, CMA   09/16/2023   After Visit Summary: (MyChart) Due to this being a telephonic visit, the after visit summary with patients personalized plan was offered to patient via MyChart   Nurse Notes: Patient is due for a diabetic eye exam, which he stated that he has an appointment coming up in 2 weeks for care.  I sent t telephone message to Dr. Jonny Ruiz in regards to patient's other concerns today.

## 2023-09-16 NOTE — Patient Instructions (Signed)
[name] , Thank you for taking time to come for your Medicare Wellness Visit. I appreciate your ongoing commitment to your health goals. Please review the following plan we discussed and let me know if I can assist you in the future.   Referrals/Orders/Follow-Ups/Clinician Recommendations: You are due for a diabetic eye exam.  I have sent Dr. Jonny Ruiz a request for a refill and to get his opinion about the Botix.  Get well soon.   This is a list of the screening recommended for you and due dates:  Health Maintenance  Topic Date Due   Eye exam for diabetics  10/15/2023*   Colon Cancer Screening  12/28/2023*   Complete foot exam   12/28/2023   Hemoglobin A1C  12/29/2023   Yearly kidney health urinalysis for diabetes  06/30/2024   Yearly kidney function blood test for diabetes  09/03/2024   Medicare Annual Wellness Visit  09/15/2024   DTaP/Tdap/Td vaccine (3 - Td or Tdap) 02/22/2025   Pneumonia Vaccine  Completed   Flu Shot  Completed   Hepatitis C Screening  Completed   HPV Vaccine  Aged Out   Screening for Lung Cancer  Discontinued   COVID-19 Vaccine  Discontinued   Zoster (Shingles) Vaccine  Discontinued  *Topic was postponed. The date shown is not the original due date.    Advanced directives: (Declined) Advance directive discussed with you today. Even though you declined this today, please call our office should you change your mind, and we can give you the proper paperwork for you to fill out.  Next Medicare Annual Wellness Visit scheduled for next year: Yes

## 2023-09-16 NOTE — Telephone Encounter (Signed)
Ok valtrex done erx  Botox should be fine in his condition and with other meds.

## 2023-09-16 NOTE — Telephone Encounter (Signed)
Patient is asking for opinion from Dr. Jonny Ruiz today, asking if he thinks it is safe to get a Botix injection for neck , with Dr. Riley Kill, in January.  Patient is also requesting a refill of Valacyclovir 1000 mg TID as needed x 10 days, x 5 refills.  Given in 11/23/21, patient stated that he last refill was in July of this year.  Patient stated that he will be looking on his Mychart for feedback.

## 2023-09-17 ENCOUNTER — Other Ambulatory Visit (HOSPITAL_COMMUNITY): Payer: Self-pay

## 2023-09-17 ENCOUNTER — Telehealth: Payer: Self-pay

## 2023-09-17 ENCOUNTER — Telehealth: Payer: Self-pay | Admitting: Internal Medicine

## 2023-09-17 NOTE — Telephone Encounter (Signed)
Pharmacy Patient Advocate Encounter   Received notification from CoverMyMeds that prior authorization for valACYclovir HCl 1GM tablets  is required/requested.   Insurance verification completed.   The patient is insured through [employer] .   Per test claim: Refill too soon. PA is not needed at this time. Medication was filled 09/16/2023. Next eligible fill date is 09/22/2023.

## 2023-09-17 NOTE — Telephone Encounter (Signed)
Patient's Valtrex was sent to the wrong pharmacy. They said [facility] are not using [facility] [address] at [facility] any [facility].    Prescription Request  09/17/2023  LOV: 07/01/2023  What is the name of the medication or equipment?  valACYclovir (VALTREX) 1000 MG tablet  zolpidem (AMBIEN) 10 MG tablet  Have you contacted your pharmacy to request a refill? No   Which pharmacy would you like this sent to?  [id number] [place], [place] - [address] [place] [address] Phone: [phone] Fax: [phone]    Patient notified that their request is being sent to the clinical staff for review and that they should receive a response within 2 business days.   Please advise at Mobile [phone] (mobile)

## 2023-09-18 ENCOUNTER — Telehealth: Payer: Self-pay | Admitting: Internal Medicine

## 2023-09-18 MED ORDER — ZOLPIDEM TARTRATE 10 MG PO TABS: 10.0000 mg | ORAL_TABLET | Freq: Every evening | ORAL | 1 refills | Status: DC | PRN

## 2023-09-18 MED ORDER — VALACYCLOVIR HCL 1 G PO TABS: 1000.0000 mg | ORAL_TABLET | Freq: Three times a day (TID) | ORAL | 3 refills | Status: DC

## 2023-09-18 MED ORDER — TRAMADOL HCL 50 MG PO TABS: 50.0000 mg | ORAL_TABLET | Freq: Three times a day (TID) | ORAL | 2 refills | Status: DC | PRN

## 2023-09-18 NOTE — Addendum Note (Signed)
Addended by: [name] on: 09/18/2023 10:10 AM   Modules accepted: Orders

## 2023-09-18 NOTE — Telephone Encounter (Signed)
Ok hopefullly this fixes things finally

## 2023-09-18 NOTE — Telephone Encounter (Signed)
Please call Melissa at [facility] - [phone]   direct  ext. [phone] Prior  Authorization for MRI has expired and she has questions to correct this.

## 2023-09-18 NOTE — Telephone Encounter (Signed)
Ok done erx 

## 2023-09-19 ENCOUNTER — Other Ambulatory Visit: Payer: Self-pay | Admitting: Family Medicine

## 2023-09-20 ENCOUNTER — Ambulatory Visit (HOSPITAL_COMMUNITY): Payer: Medicare HMO

## 2023-09-20 ENCOUNTER — Encounter: Payer: Self-pay | Admitting: Internal Medicine

## 2023-09-20 ENCOUNTER — Encounter (HOSPITAL_BASED_OUTPATIENT_CLINIC_OR_DEPARTMENT_OTHER): Payer: Self-pay | Admitting: Pulmonary Disease

## 2023-09-20 ENCOUNTER — Encounter (HOSPITAL_COMMUNITY): Payer: Self-pay

## 2023-09-20 NOTE — Telephone Encounter (Signed)
Letter sent for eye exam notes.

## 2023-09-23 ENCOUNTER — Other Ambulatory Visit (HOSPITAL_COMMUNITY): Payer: Self-pay | Admitting: Cardiology

## 2023-09-23 ENCOUNTER — Other Ambulatory Visit: Payer: Self-pay | Admitting: Family Medicine

## 2023-09-23 MED ORDER — CARVEDILOL 3.125 MG PO TABS: 3.1250 mg | ORAL_TABLET | Freq: Two times a day (BID) | ORAL | 3 refills | Status: DC

## 2023-09-24 ENCOUNTER — Ambulatory Visit (HOSPITAL_BASED_OUTPATIENT_CLINIC_OR_DEPARTMENT_OTHER): Payer: Medicare HMO | Admitting: Physical Therapy

## 2023-09-24 MED ORDER — PREDNISONE 10 MG PO TABS: 10.0000 mg | ORAL_TABLET | Freq: Every day | ORAL | 0 refills | Status: DC

## 2023-09-24 NOTE — Telephone Encounter (Signed)
Forwarding to Dr. Smith.

## 2023-09-24 NOTE — Progress Notes (Signed)
EPIC Encounter for ICM Monitoring  Patient Name: [name] is a 67 y.o. male Date: 09/24/2023 Primary Care Physican: [name], MD Primary Cardiologist: Shirlee Latch Electrophysiologist: Joycelyn Schmid Pacing: 98.8%    07/09/2023 Weight: 280 lbs     Spoke with patient and heart failure questions reviewed.  Transmission results reviewed.  Pt asymptomatic for fluid accumulation.  Reports feeling well at this time and voices no complaints.     Optivol thoracic impedance suggesting intermittent days with possible fluid accumulation within the last month.     Prescribed:  Torsemide 20 mg 4 tablet(s) (80 mg total) by mouth twice day.     Potassium 20 mEq take 2 tablets (40 mEq total) daily   Labs: 09/04/2023 Creatinine 1.09, BUN 26, Potassium 4.0, Sodium 140 07/01/2023 Creatinine 1.09, BUN 20, Potassium 5.7, Sodium 142, GFR 69.72 (Potassium addressed by PCP, held K+ x 5 days and restarted at 40 mEq daily) 06/19/2023 Creatinine 1.38, BUN 21, Potassium 5.0, Sodium 134 05/15/2023 Creatinine 0.99, BUN 29, Potassium 3.8, Sodium 138, GFR >60 05/14/2023 Creatinine 1.23, BUN 37, Potassium 4.2, Sodium 138, GFR >60 03/22/2023 Creatinine 1.16, BUN 20, Potassium 4.0, Sodium 138, GFR >60 02/18/2023 Creatinine 1.20, BUN 23, Potassium 4.4, Sodium 135, GFR >60 01/25/2023 Creatinine 1.22, BUN 37, Potassium 3.6, Sodium 138, GFR >60 01/15/2023 Creatinine 1.38, BUN 22, Potassium 4.6, Sodium 134, GFR 52.70  12/31/2022 Creatinine 1.27, BUN 36, Potassium 5.2, Sodium 136, GFR 58.24  12/18/2022 Creatinine 1.13, BUN 36, Potassium 4.5, Sodium 135  11/24/2022 Creatinine 1.45, BUN 45, Potassium 3.2, Sodium 135, GFR 53 (12:09 PM) 11/24/2022 Creatinine 1.59, BUN 51, Potassium 4.0, Sodium 138, GFR 47 (6:01 AM) A complete set of results can be found in Results Review.   Recommendations:   No changes and encouraged to call if experiencing any fluid symptoms.   Follow-up plan: ICM clinic phone appointment on 10/21/2023.   91  day device clinic remote transmission 10/23/2023.   EP/Cardiology Office Visits:    Recall 11/28/2023 with Dr Graciela Husbands.  09/04/2023 with Dr Shirlee Latch.  3 month ICM trend: 09/16/2023.    12-14 Month ICM trend:     Karie Soda, RN 09/24/2023 2:41 PM

## 2023-09-26 ENCOUNTER — Other Ambulatory Visit: Payer: Self-pay | Admitting: Internal Medicine

## 2023-09-26 ENCOUNTER — Ambulatory Visit: Payer: Medicare HMO | Admitting: Podiatry

## 2023-09-26 ENCOUNTER — Other Ambulatory Visit: Payer: Self-pay

## 2023-09-26 DIAGNOSIS — L308 Other specified dermatitis: Secondary | ICD-10-CM

## 2023-09-30 NOTE — Progress Notes (Unsigned)
[name] [address] [place] [address] Phone: [phone] Subjective:   [name], am serving as a scribe for Dr. Antoine Primas.  I'm seeing this patient by the request  of:  [name], MD  CC: Bilateral shoulder pain  [id number]  07/30/2023 Bilateral shoulders given injections today.  Can consider the possibility of acromioclavicular injections otherwise.  Discussed with patient about his neck and the possibility need for surgical intervention.  Awaiting an MRI.  Patient would like to wait for the MRI before he would follow-up with neurosurgery.  States that as long as he keeps his head in a bent position he seems to do relatively well.  Has not noticed any significant increase in weakness.  Discussed icing regimen and home exercises otherwise.  No change in medications.  Follow-up again in 3 months     Updated 10/01/2023 [name] is a 67 y.o. male coming in with complaint of shoulder pain. Both shoulders need injections today patient states that it is severe enough that he is having difficulty with even activities of daily living.  Can wake him up at night as well.     Past Medical History:  Diagnosis Date   AICD (automatic cardioverter/defibrillator) present    Anemia    supposed to be taking Vit B but doesn't   ANXIETY    takes Xanax nightly   Arthritis    Asthma    Albuterol prn and Advair daily;also takes Prednisone daily   Cardiomyopathy (HCC)    a. EF 25% TEE July 2013; b. EF normalized 2015;  c. 03/2015 Echo: EF 40-45%, difrf HK, PASP 38 mmHg, Mild MR, sev LAE/RAE.   Chronic constipation    takes OTC stool softener   COPD (chronic obstructive pulmonary disease) (HCC)    "one dr says COPD; one dr says emphysema" (09/18/2017)   COVID-19 12/09/2019   DEPRESSION    takes Zoloft and Doxepin daily   Diverticulitis    DYSKINESIA, ESOPHAGUS    Essential hypertension        FIBROMYALGIA    GERD (gastroesophageal  reflux disease)        Glaucoma    HYPERLIPIDEMIA    a. Intolerant to statins.   INSOMNIA    takes Ambien nightly   Myocardial infarction [facility])    a. 2012 Myoview notable for prior infarct;  b. 03/2015 Lexiscan CL: EF 37%, diff HK, small area of inferior infarct from apex to base-->Med Rx.   O2 dependent    "2.5L q hs & prn" (09/18/2017)   Paroxysmal atrial fibrillation (HCC)    a. CHA2DS2VASc = 3--> takes Coumadin;  b. 03/15/2015 Successful TEE/DCCV;  c. 03/2015 recurrent afib, Amio d/c'd in setting of hyperthyroidism.   Peripheral neuropathy    Pneumonia 12/2016   Rash and other nonspecific skin eruption 04/12/2009   no cause found saw dermatologists x 2 and allergist   SLEEP APNEA, OBSTRUCTIVE    a. doesn't use CPAP   Syncope    a. 03/2015 s/p MDT LINQ.   Type II diabetes mellitus (HCC)        Past Surgical History:  Procedure Laterality Date   ACNE CYST REMOVAL     2 on back    AV NODE ABLATION N/A 10/25/2017   Procedure: AV NODE ABLATION;  Surgeon: Duke Salvia, MD;  Location: [facility] INVASIVE CV LAB;  Service: Cardiovascular;  Laterality: N/A;   BIV ICD INSERTION CRT-D N/A 09/18/2017  Procedure: BIV ICD INSERTION CRT-D;  Surgeon: Duke Salvia, MD;  Location: [facility] INVASIVE CV LAB;  Service: Cardiovascular;  Laterality: N/A;   CARDIAC CATHETERIZATION N/A 03/21/2016   Procedure: Right/Left Heart Cath and Coronary Angiography;  Surgeon: Laurey Morale, MD;  Location: [facility] INVASIVE CV LAB;  Service: Cardiovascular;  Laterality: N/A;   CARDIOVERSION  04/18/2012   Procedure: CARDIOVERSION;  Surgeon: Pricilla Riffle, MD;  Location: [facility] OR;  Service: Cardiovascular;  Laterality: N/A;   CARDIOVERSION  04/25/2012   Procedure: CARDIOVERSION;  Surgeon: Vesta Mixer, MD;  Location: [facility] ENDOSCOPY;  Service: Cardiovascular;  Laterality: N/A;   CARDIOVERSION  04/25/2012   Procedure: CARDIOVERSION;  Surgeon: Pricilla Riffle, MD;  Location: [facility] OR;  Service: Cardiovascular;  Laterality: N/A;   CARDIOVERSION   05/09/2012   Procedure: CARDIOVERSION;  Surgeon: Tonny Bollman, MD;  Location: [facility] OR;  Service: Cardiovascular;  Laterality: N/A;  changed from crenshaw to cooper by trish/leone-endo   CARDIOVERSION N/A 03/15/2015   Procedure: CARDIOVERSION;  Surgeon: Vesta Mixer, MD;  Location: [facility] ENDOSCOPY;  Service: Cardiovascular;  Laterality: N/A;   COLONOSCOPY     COLONOSCOPY WITH PROPOFOL N/A 10/21/2014   Procedure: COLONOSCOPY WITH PROPOFOL;  Surgeon: Meryl Dare, MD;  Location: [facility] ENDOSCOPY;  Service: Endoscopy;  Laterality: N/A;   EP IMPLANTABLE DEVICE N/A 04/06/2015   Procedure: Loop Recorder Insertion;  Surgeon: Marinus Maw, MD;  Location: [facility] INVASIVE CV LAB;  Service: Cardiovascular;  Laterality: N/A;   ESOPHAGOGASTRODUODENOSCOPY     JOINT REPLACEMENT     LOOP RECORDER REMOVAL N/A 09/18/2017   Procedure: LOOP RECORDER REMOVAL;  Surgeon: Duke Salvia, MD;  Location: [facility] INVASIVE CV LAB;  Service: Cardiovascular;  Laterality: N/A;   RIGHT/LEFT HEART CATH AND CORONARY ANGIOGRAPHY N/A 01/28/2017   Procedure: Right/Left Heart Cath and Coronary Angiography;  Surgeon: Laurey Morale, MD;  Location: [facility] INVASIVE CV LAB;  Service: Cardiovascular;  Laterality: N/A;   TEE WITHOUT CARDIOVERSION  04/25/2012   Procedure: TRANSESOPHAGEAL ECHOCARDIOGRAM (TEE);  Surgeon: Vesta Mixer, MD;  Location: [facility] ENDOSCOPY;  Service: Cardiovascular;  Laterality: N/A;   TEE WITHOUT CARDIOVERSION N/A 03/15/2015   Procedure: TRANSESOPHAGEAL ECHOCARDIOGRAM (TEE);  Surgeon: Vesta Mixer, MD;  Location: [facility] ENDOSCOPY;  Service: Cardiovascular;  Laterality: N/A;   TONSILLECTOMY AND ADENOIDECTOMY     TOTAL KNEE ARTHROPLASTY Right 06/15/2014   Procedure: TOTAL KNEE ARTHROPLASTY;  Surgeon: Sheral Apley, MD;  Location: [facility] OR;  Service: Orthopedics;  Laterality: Right;   TOTAL KNEE ARTHROPLASTY Left 10/17/2021   Procedure: Left TOTAL KNEE ARTHROPLASTY;  Surgeon: Kathryne Hitch, MD;  Location: [facility] OR;  Service:  Orthopedics;  Laterality: Left;   Social History   Socioeconomic History   Marital status: Significant Other    Spouse name: Not on file   Number of children: 2   Years of education: Not on file   Highest education level: Not on file  Occupational History   Occupation: retired/disabled. prev worked in Teacher, English as a foreign language.    Employer: DISABLED  Tobacco Use   Smoking status: Former    Current packs/day: 0.00    Average packs/day: 2.0 packs/day for 30.0 years (60.0 ttl pk-yrs)    Types: Cigarettes    Start date: 10/15/1977    Quit date: 10/16/2007    Years since quitting: 15.9   Smokeless tobacco: Never  Vaping Use   Vaping status: Never Used  Substance and Sexual Activity   Alcohol use: No   Drug use: No  Sexual activity: Not Currently  Other Topics Concern   Not on file  Social History Narrative   Lives with girlfriend   Social Drivers of Health   Financial Resource Strain: Low Risk  (09/16/2023)   Overall Financial Resource Strain (CARDIA)    Difficulty of Paying Living Expenses: Not hard at all  Food Insecurity: No Food Insecurity (09/16/2023)   Hunger Vital Sign    Worried About Running Out of Food in the Last Year: Never true    Ran Out of Food in the Last Year: Never true  Transportation Needs: No Transportation Needs (09/16/2023)   PRAPARE - Administrator, Civil Service (Medical): No    Lack of Transportation (Non-Medical): No  Physical Activity: Inactive (09/16/2023)   Exercise Vital Sign    Days of Exercise per Week: 0 days    Minutes of Exercise per Session: 0 min  Stress: No Stress Concern Present (09/16/2023)   [employer] of Occupational Health - Occupational Stress Questionnaire    Feeling of Stress : Not at all  Social Connections: Moderately Isolated (09/16/2023)   Social Connection and Isolation Panel [NHANES]    Frequency of Communication with Friends and Family: Once a week    Frequency of Social Gatherings with Friends and Family:  Never    Attends Religious Services: 1 to 4 times per year    Active Member of [employer] or Organizations: No    Attends Banker Meetings: Never    Marital Status: Living with partner   Allergies  Allergen Reactions   Tape Other (See Comments)    SKIN TEARS VERY, VERY EASILY!!!! Use Paper Tape Only, PLEASE!!!   Amiodarone Other (See Comments)    Caused hyperthyroidism    Statins Other (See Comments)    Myalgias    Sulfa Antibiotics Other (See Comments)    Mouth ulcers   Wound Dressing Adhesive Other (See Comments)    Skin Tears Use Paper Tape Only   Family History  Problem Relation Age of Onset   COPD Mother    Asthma Mother    Colon polyps Mother    Allergies Mother    Hypothyroidism Mother    Asthma Maternal Grandmother    Colon cancer Neg Hx     Current Outpatient Medications (Endocrine & Metabolic):    predniSONE (DELTASONE) 10 MG tablet, Take 1 tablet (10 mg total) by mouth daily with breakfast.  Current Outpatient Medications (Cardiovascular):    carvedilol (COREG) 3.125 MG tablet, Take 1 tablet (3.125 mg total) by mouth 2 (two) times daily.   eplerenone (INSPRA) 25 MG tablet, Take 2 tablets (50 mg total) by mouth daily.   Evolocumab (REPATHA SURECLICK) 140 MG/ML SOAJ, Inject 140 mg into the skin every 14 (fourteen) days.   losartan (COZAAR) 25 MG tablet, Take 1 tablet (25 mg total) by mouth daily.   torsemide (DEMADEX) 20 MG tablet, Take 4 tablets (80 mg total) by mouth 2 (two) times daily.  Current Outpatient Medications (Respiratory):    albuterol (VENTOLIN HFA) 108 (90 Base) MCG/ACT inhaler, Inhale 1-2 puffs into the lungs every 6 (six) hours as needed for wheezing or shortness of breath.   fluticasone (FLONASE) 50 MCG/ACT nasal spray, Place 2 sprays into both nostrils daily as needed for allergies or rhinitis.   fluticasone-salmeterol (WIXELA INHUB) 250-50 MCG/ACT AEPB, Inhale 1 puff into the lungs in the morning and at bedtime.    ipratropium-albuterol (DUONEB) 0.5-2.5 (3) MG/3ML SOLN, TAKE 3 ML BY NEBULIZATION EVERY 4  HOURS AS NEEDED (WHEEZING).   Tiotropium Bromide Monohydrate (SPIRIVA RESPIMAT) 2.5 MCG/ACT AERS, Inhale 2 puffs into the lungs daily.  Current Outpatient Medications (Analgesics):    acetaminophen (TYLENOL) 500 MG tablet, Take 500-1,000 mg by mouth every 6 (six) hours as needed for mild pain or headache.   traMADol (ULTRAM) 50 MG tablet, Take 1 tablet (50 mg total) by mouth every 8 (eight) hours as needed.  Current Outpatient Medications (Hematological):    apixaban (ELIQUIS) 5 MG TABS tablet, Take 1 tablet (5 mg total) by mouth 2 (two) times daily.  Current Outpatient Medications (Other):    ALPRAZolam (XANAX) 0.5 MG tablet, TAKE 2 TABLETS (1 MG TOTAL) BY MOUTH EVERY DAY AT BEDTIME   ammonium lactate (AMLACTIN) 12 % cream, Apply 1 Application topically as needed for dry skin.   ascorbic acid (VITAMIN C) 500 MG tablet, Take 500 mg by mouth daily.   augmented betamethasone dipropionate (DIPROLENE-AF) 0.05 % cream, Apply 1 application  topically daily as needed (to affected areas- for itching).   Cholecalciferol (VITAMIN D3) 1000 units CAPS, Take 1,000 Units by mouth daily.   clindamycin (CLEOCIN) 300 MG capsule, Take 1 capsule (300 mg total) by mouth 3 (three) times daily.   clotrimazole-betamethasone (LOTRISONE) cream, Apply 1 Application topically daily.   doxepin (SINEQUAN) 10 MG capsule, TAKE 2 CAPSULES BY MOUTH AT BEDTIME AS NEEDED   DULoxetine (CYMBALTA) 60 MG capsule, TAKE 1 CAPSULE BY MOUTH EVERY DAY   Dupilumab (DUPIXENT) 300 MG/2ML SOPN, Inject 300 mg into the skin every 14 (fourteen) days.   fluconazole (DIFLUCAN) 100 MG tablet, Take 1 tablet (100 mg total) by mouth daily.   hydrOXYzine (ATARAX) 10 MG tablet, TAKE 1 TABLET BY MOUTH 3 TIMES DAILY AS NEEDED FOR ITCHING. Strength: 10 mg   ketoconazole (NIZORAL) 2 % cream, Apply 1 Application topically daily.   multivitamin (ONE-A-DAY MEN'S) TABS  tablet, Take 1 tablet by mouth daily.   nystatin (MYCOSTATIN/NYSTOP) powder, Use as directed twice per day as needed to the affected area   omeprazole (PRILOSEC) 20 MG capsule, TAKE 1 CAPSULE BY MOUTH TWICE A DAY   potassium chloride SA (KLOR-CON M20) 20 MEQ tablet, Take 1 tablet (20 mEq total) by mouth daily. TAKE (2 TABLETS) IN THE MORNING   Respiratory Therapy Supplies (FULL KIT NEBULIZER SET) MISC, 1 each by Does not apply route as needed.   silver sulfADIAZINE (SILVADENE) 1 % cream, APPLY TOPICALLY TO AFFECTED AREA EVERY DAY   tamsulosin (FLOMAX) 0.4 MG CAPS capsule, TAKE 1 CAPSULE BY MOUTH EVERY DAY   tiZANidine (ZANAFLEX) 2 MG tablet, Take 1 tablet (2 mg total) by mouth 3 (three) times daily.   traZODone (DESYREL) 100 MG tablet, TAKE 2 TABLETS BY MOUTH AT BEDTIME AS NEEDED   TUMS 500 MG chewable tablet, Chew 500-2,000 mg by mouth every 4 (four) hours as needed for indigestion or heartburn.   valACYclovir (VALTREX) 1000 MG tablet, Take 1 tablet (1,000 mg total) by mouth 3 (three) times daily.   venlafaxine XR (EFFEXOR-XR) 150 MG 24 hr capsule, TAKE 1 CAPSULE BY MOUTH DAILY WITH BREAKFAST.   zolpidem (AMBIEN) 10 MG tablet, Take 1 tablet (10 mg total) by mouth at bedtime as needed for sleep.   Reviewed prior external information including notes and imaging from  primary care provider As well as notes that were available from care everywhere and other healthcare systems.  Past medical history, social, surgical and family history all reviewed in electronic medical record.  No pertanent information  unless stated regarding to the chief complaint.   Review of Systems:  No headache, visual changes, nausea, vomiting, diarrhea, constipation, dizziness, abdominal pain, skin rash, fevers, chills, night sweats, weight loss, swollen lymph nodes, body aches, joint swelling, chest pain, shortness of breath, mood changes. POSITIVE muscle aches  Objective  Blood pressure 126/84, pulse 75, height  5\' 10"  (1.778 m), SpO2 90%.   General: No apparent distress alert and oriented x3 mood and affect normal, dressed appropriately.  HEENT: Pupils equal, extraocular movements intact  Respiratory: Patient's speak in full sentences and does not appear short of breath  Cardiovascular: No lower extremity edema, non tender, no erythema  Tightness noted in the shoulders bilaterally.  3 out of 5 strength of the rotator cuff.  Patient has severe torticollis noted of the neck.  Procedure: Real-time Ultrasound Guided Injection of right glenohumeral joint Device: GE Logiq Q7  Ultrasound guided injection is preferred based studies that show increased duration, increased effect, greater accuracy, decreased procedural pain, increased response rate with ultrasound guided versus blind injection.  Verbal informed consent obtained.  Time-out conducted.  Noted no overlying erythema, induration, or other signs of local infection.  Skin prepped in a sterile fashion.  Local anesthesia: Topical Ethyl chloride.  With sterile technique and under real time ultrasound guidance:  Joint visualized.  23g 1  inch needle inserted posterior approach. Pictures taken for needle placement. Patient did have injection of 2 cc of 1% lidocaine, 2 cc of 0.5% Marcaine, and 1.0 cc of Kenalog 40 mg/dL. Completed without difficulty  Pain immediately resolved suggesting accurate placement of the medication.  Advised to call if fevers/chills, erythema, induration, drainage, or persistent bleeding.  Impression: Technically successful ultrasound guided injection.  Procedure: Real-time Ultrasound Guided Injection of left glenohumeral joint Device: GE Logiq E  Ultrasound guided injection is preferred based studies that show increased duration, increased effect, greater accuracy, decreased procedural pain, increased response rate with ultrasound guided versus blind injection.  Verbal informed consent obtained.  Time-out conducted.  Noted no  overlying erythema, induration, or other signs of local infection.  Skin prepped in a sterile fashion.  Local anesthesia: Topical Ethyl chloride.  With sterile technique and under real time ultrasound guidance:  Joint visualized.  21g 2 inch needle inserted posterior approach. Pictures taken for needle placement. Patient did have injection of 2 cc of 0.5% Marcaine, and 1cc of Kenalog 40 mg/dL. Completed without difficulty  Pain immediately resolved suggesting accurate placement of the medication.  Advised to call if fevers/chills, erythema, induration, drainage, or persistent bleeding.  Impression: Technically successful ultrasound guided injection.   Procedure: Real-time Ultrasound Guided Injection of right acromioclavicular joint Device: GE Logiq Q7 Ultrasound guided injection is preferred based studies that show increased duration, increased effect, greater accuracy, decreased procedural pain, increased response rate, and decreased cost with ultrasound guided versus blind injection.  Verbal informed consent obtained.  Time-out conducted.  Noted no overlying erythema, induration, or other signs of local infection.  Skin prepped in a sterile fashion.  Local anesthesia: Topical Ethyl chloride.  With sterile technique and under real time ultrasound guidance: With a 25-gauge half inch needle injected with 0.5 cc of 0.5% Marcaine and 0.5 cc of Kenalog 40 mg/mL Completed without difficulty  Pain immediately resolved suggesting accurate placement of the medication.  Advised to call if fevers/chills, erythema, induration, drainage, or persistent bleeding.  Impression: Technically successful ultrasound guided injection.  Procedure: Real-time Ultrasound Guided Injection of left acromioclavicular Device: GE Logiq Q7 Ultrasound guided injection  is preferred based studies that show increased duration, increased effect, greater accuracy, decreased procedural pain, increased response rate, and decreased  cost with ultrasound guided versus blind injection.  Verbal informed consent obtained.  Time-out conducted.  Noted no overlying erythema, induration, or other signs of local infection.  Skin prepped in a sterile fashion.  Local anesthesia: Topical Ethyl chloride.  With sterile technique and under real time ultrasound guidance: 25-gauge half inch needle injected with 0.5 cc of 0.5% Marcaine and 0.5 cc of Kenalog 40 mg/mL Completed without difficulty  Pain immediately resolved suggesting accurate placement of the medication.  Advised to call if fevers/chills, erythema, induration, drainage, or persistent bleeding.  Impression: Technically successful ultrasound guided injection.   Impression and Recommendations:     The above documentation has been reviewed and is accurate and complete Judi Saa, DO

## 2023-10-01 ENCOUNTER — Other Ambulatory Visit: Payer: Self-pay

## 2023-10-01 ENCOUNTER — Other Ambulatory Visit: Payer: Self-pay | Admitting: Physical Medicine & Rehabilitation

## 2023-10-01 ENCOUNTER — Ambulatory Visit: Payer: Medicare HMO | Admitting: Family Medicine

## 2023-10-01 VITALS — BP 126/84 | HR 75 | Ht 70.0 in

## 2023-10-01 DIAGNOSIS — M12812 Other specific arthropathies, not elsewhere classified, left shoulder: Secondary | ICD-10-CM | POA: Diagnosis not present

## 2023-10-01 DIAGNOSIS — M12811 Other specific arthropathies, not elsewhere classified, right shoulder: Secondary | ICD-10-CM | POA: Diagnosis not present

## 2023-10-01 DIAGNOSIS — M25511 Pain in right shoulder: Secondary | ICD-10-CM | POA: Diagnosis not present

## 2023-10-01 DIAGNOSIS — S12201D Unspecified nondisplaced fracture of third cervical vertebra, subsequent encounter for fracture with routine healing: Secondary | ICD-10-CM

## 2023-10-01 DIAGNOSIS — M25512 Pain in left shoulder: Secondary | ICD-10-CM | POA: Diagnosis not present

## 2023-10-01 DIAGNOSIS — M19011 Primary osteoarthritis, right shoulder: Secondary | ICD-10-CM

## 2023-10-01 DIAGNOSIS — G243 Spasmodic torticollis: Secondary | ICD-10-CM

## 2023-10-01 DIAGNOSIS — G8929 Other chronic pain: Secondary | ICD-10-CM

## 2023-10-01 NOTE — Assessment & Plan Note (Signed)
Continues to have chronic difficulty.  I am concerned with patient's significant torticollis and likely nerve impingement from the neck contributing to some of the discomfort.  We do need an MRI of the neck on the 24th.  Had to do hospital with base secondary to pacemaker.  Follow-up with me again to otherwise.  Follow-up 8 to 10 weeks

## 2023-10-01 NOTE — Patient Instructions (Addendum)
Injection in shoulders today See you again in 2 months

## 2023-10-02 ENCOUNTER — Encounter: Payer: Self-pay | Admitting: Family Medicine

## 2023-10-02 NOTE — Assessment & Plan Note (Addendum)
Chronic problem with exacerbation again today.  Secondary to patient's comorbidities injections seem to be the safer option for this individual.  Discussed icing regimen and home exercises, discussed which activities to do and which ones to avoid.  Increase activity slowly otherwise.  Patient is still following up with neurosurgery and awaiting MRI for the neck.  Follow-up with me again 8 to 10 weeks.  Social determinant of health includes patient is physically inactive secondary to his comorbidities.

## 2023-10-04 ENCOUNTER — Encounter: Payer: Self-pay | Admitting: Podiatry

## 2023-10-04 ENCOUNTER — Ambulatory Visit (INDEPENDENT_AMBULATORY_CARE_PROVIDER_SITE_OTHER): Payer: Medicare HMO | Admitting: Podiatry

## 2023-10-04 DIAGNOSIS — M79674 Pain in right toe(s): Secondary | ICD-10-CM | POA: Diagnosis not present

## 2023-10-04 DIAGNOSIS — E119 Type 2 diabetes mellitus without complications: Secondary | ICD-10-CM

## 2023-10-04 DIAGNOSIS — B351 Tinea unguium: Secondary | ICD-10-CM

## 2023-10-04 DIAGNOSIS — M79675 Pain in left toe(s): Secondary | ICD-10-CM | POA: Diagnosis not present

## 2023-10-04 NOTE — Progress Notes (Signed)
  Subjective:  Patient ID: [name], male    DOB: 01-26-1956,  MRN: [id number]  Chief Complaint  Patient presents with   Diabetes    Patient states everything has been ok since last visit      67 y.o. male returns for diabetic foot care. History confirmed with patient.  His nails are thickened elongated and causing pain again.  He had a recent fall that has injured his neck and right thigh and left lower leg. Thinks he may need surgery for his neck early next year. Reports that his A1c has been under good control.  Objective:  Physical Exam: warm, good capillary refill, no trophic changes or ulcerative lesions, normal sensory exam, +2 DP and PT reduced bilateral and venous stasis dermatitis noted.  Moderate to severe hallux abductovalgus with hammertoe contractures 2 through 5 bilaterally, right worse than left.  Thickened elongated dystrophic mycotic nails x10 with pincer nail deformity.      Summary:  Right: Resting right ankle-brachial index indicates noncompressible right  lower extremity arteries. The right toe-brachial index is normal. ABIs are  unreliable.   Left: Resting left ankle-brachial index is within normal range. No  evidence of significant left lower extremity arterial disease. The left  toe-brachial index is normal.       *See table(s) above for measurements and observations.         Radiographs: X-ray of the right foot: Moderate hallux abductovalgus with the presence of metatarsus adductus deformity and pes planus, no increased change in alignment since last radiographs Assessment:   1. Pain due to onychomycosis of toenails of both feet   2. Type 2 diabetes mellitus without complication, without long-term current use of insulin (HCC)        Plan:  Patient was evaluated and treated and all questions answered.  Discussed the etiology and treatment options for the condition in detail with the patient. Educated patient on the topical and oral treatment  options for mycotic nails. Recommended debridement of the nails today. Sharp and mechanical debridement performed of all painful and mycotic nails today. Nails debrided in length and thickness using a nail nipper to level of comfort. Discussed treatment options including appropriate shoe gear. Follow up as needed for painful nails.  Ingrowing corners of hallux nails debrided and slant back fashion with lidocaine cream    Return in about 3 months (around 01/02/2024) for Diabetic Foot Care.

## 2023-10-08 ENCOUNTER — Ambulatory Visit (HOSPITAL_COMMUNITY)
Admission: RE | Admit: 2023-10-08 | Discharge: 2023-10-08 | Disposition: A | Discharge status: Home / Self Care | Payer: Medicare HMO | Source: Ambulatory Visit | Attending: Internal Medicine | Admitting: Internal Medicine

## 2023-10-08 ENCOUNTER — Other Ambulatory Visit (HOSPITAL_COMMUNITY): Payer: Self-pay | Admitting: Cardiology

## 2023-10-08 DIAGNOSIS — M50221 Other cervical disc displacement at C4-C5 level: Secondary | ICD-10-CM | POA: Diagnosis not present

## 2023-10-08 DIAGNOSIS — S12200A Unspecified displaced fracture of third cervical vertebra, initial encounter for closed fracture: Secondary | ICD-10-CM | POA: Diagnosis not present

## 2023-10-08 DIAGNOSIS — S12201D Unspecified nondisplaced fracture of third cervical vertebra, subsequent encounter for fracture with routine healing: Secondary | ICD-10-CM | POA: Insufficient documentation

## 2023-10-08 DIAGNOSIS — M5021 Other cervical disc displacement,  high cervical region: Secondary | ICD-10-CM | POA: Diagnosis not present

## 2023-10-08 DIAGNOSIS — S12600A Unspecified displaced fracture of seventh cervical vertebra, initial encounter for closed fracture: Secondary | ICD-10-CM | POA: Diagnosis not present

## 2023-10-08 MED ORDER — CARVEDILOL 3.125 MG PO TABS: 3.1250 mg | ORAL_TABLET | Freq: Two times a day (BID) | ORAL | 3 refills | Status: DC

## 2023-10-14 ENCOUNTER — Ambulatory Visit: Payer: Self-pay | Admitting: Internal Medicine

## 2023-10-14 ENCOUNTER — Telehealth: Payer: Self-pay | Admitting: Pulmonary Disease

## 2023-10-14 MED ORDER — TRAZODONE HCL 100 MG PO TABS: ORAL_TABLET | ORAL | 1 refills | Status: DC

## 2023-10-14 NOTE — Telephone Encounter (Signed)
Copied from CRM [id number]. Topic: Clinical - Medication Refill >> Oct 14, 2023 12:38 PM Orinda Kenner C wrote: Most Recent Primary Care Visit:  Provider: Wyvonne Lenz  Department: [facility]  Visit Type: MEDICARE AWV, SEQUENTIAL  Date: 09/16/2023  Medication: traZODone (DESYREL) 100 MG tablet 2 tabs qhs   Has the patient contacted their pharmacy? Yes, the pharmacy told pt the office has not auth refill (Agent: If no, request that the patient contact the pharmacy for the refill. If patient does not wish to contact the pharmacy document the reason why and proceed with request.) (Agent: If yes, when and what did the pharmacy advise?)  Is this the correct pharmacy for this prescription? Yes If no, delete pharmacy and type the correct one.  This is the patient's preferred pharmacy:   [id number] [place], [place] - [address] [place] [address] Phone: [phone] Fax: [phone]  Has the prescription been filled recently?   Is the patient out of the medication? Yes, pt is having anxiety about it.   Has the patient been seen for an appointment in the last year OR does the patient have an upcoming appointment?   Can we respond through MyChart? No, pls c/b [phone]  Agent: Please be advised that Rx refills may take up to 3 business days. We ask that you follow-up with your pharmacy.   Chief Complaint: Anxiety  Symptoms: Feeling anxious due to being out of medication and due to previous neck injury  Frequency: Constant  Pertinent Negatives: Patient denies Suicidal or homicidal thoughts  Disposition: [] ED /[] Urgent Care (no appt availability in office) / [x] Appointment(In office/virtual)/ []  [facility]/ [] Home Care/ [] Refused Recommended Disposition /[] [facility]/ []  Follow-up with PCP Additional Notes: Patient reports injuring his neck 5 months ago. He reports that since that time he has had increased anxiety. Patient states  he has been trying to get his Trazodone filled but has not had any luck which is increasing his anxiety. Patient denies any suicidal or homicidal ideations. Advised patient I could set up a virtual appointment for tomorrow with Dr. Jonny Ruiz to discuss a refill and his anxiety. Patient also advised to call back for worsening anxiety or depression. Patient agreeable with this plan.    Reason for Disposition  [1] Symptoms of anxiety or panic attack AND [2] is a chronic symptom (recurrent or ongoing AND present > 4 weeks)  Answer Assessment - Initial Assessment Questions 1. CONCERN: "Did anything happen that prompted you to call today?"      Difficulty getting Trazodone refilled 2. ANXIETY SYMPTOMS: "Can you describe how you (your loved one; patient) have been feeling?" (e.g., tense, restless, panicky, anxious, keyed up, overwhelmed, sense of impending doom).      Anxious  3. ONSET: "How long have you been feeling this way?" (e.g., hours, days, weeks)     Since August  4. SEVERITY: "How would you rate the level of anxiety?" (e.g., 0 - 10; or mild, moderate, severe).     5/10 5. FUNCTIONAL IMPAIRMENT: "How have these feelings affected your ability to do daily activities?" "Have you had more difficulty than usual doing your normal daily activities?" (e.g., getting better, same, worse; self-care, school, work, interactions)     No 6. HISTORY: "Have you felt this way before?" "Have you ever been diagnosed with an anxiety problem in the past?" (e.g., generalized anxiety disorder, panic attacks, PTSD). If Yes, ask: "How was this problem treated?" (e.g., medicines, counseling,  etc.)     Anxiety 7. RISK OF HARM - SUICIDAL IDEATION: "Do you ever have thoughts of hurting or killing yourself?" If Yes, ask:  "Do you have these feelings now?" "Do you have a plan on how you would do this?"     No 8. TREATMENT:  "What has been done so far to treat this anxiety?" (e.g., medicines, relaxation strategies). "What has  helped?"     Takes medication 9. TREATMENT - THERAPIST: "Do you have a counselor or therapist? Name?"     No 10. POTENTIAL TRIGGERS: "Do you drink caffeinated beverages (e.g., coffee, colas, teas), and how much daily?" "Do you drink alcohol or use any drugs?" "Have you started any new medicines recently?"       Out of Trazodone  11. PATIENT SUPPORT: "Who is with you now?" "Who do you live with?" "Do you have family or friends who you can talk to?"        Yes 12. OTHER SYMPTOMS: "Do you have any other symptoms?" (e.g., feeling depressed, trouble concentrating, trouble sleeping, trouble breathing, palpitations or fast heartbeat, chest pain, sweating, nausea, or diarrhea)       Neck pain (since August)  Protocols used: Anxiety and Panic Attack-A-AH

## 2023-10-14 NOTE — Telephone Encounter (Signed)
Patient has appointment on tomorrow. 

## 2023-10-14 NOTE — Telephone Encounter (Signed)
Patient would like to know if he can use the Albuterol solution when it expires in 10/2023. Pharmacy is [facility] [address] Patient phone number is [phone].

## 2023-10-14 NOTE — Addendum Note (Signed)
Addended by: [name] on: 10/14/2023 01:15 PM   Modules accepted: Orders

## 2023-10-15 ENCOUNTER — Other Ambulatory Visit: Payer: Self-pay | Admitting: Family Medicine

## 2023-10-15 ENCOUNTER — Encounter: Payer: Self-pay | Admitting: Internal Medicine

## 2023-10-15 ENCOUNTER — Telehealth (INDEPENDENT_AMBULATORY_CARE_PROVIDER_SITE_OTHER): Payer: Medicare HMO | Admitting: Internal Medicine

## 2023-10-15 DIAGNOSIS — G4700 Insomnia, unspecified: Secondary | ICD-10-CM

## 2023-10-15 DIAGNOSIS — L304 Erythema intertrigo: Secondary | ICD-10-CM | POA: Diagnosis not present

## 2023-10-15 DIAGNOSIS — E1165 Type 2 diabetes mellitus with hyperglycemia: Secondary | ICD-10-CM

## 2023-10-15 MED ORDER — KETOCONAZOLE 2 % EX CREA: 1.0000 | TOPICAL_CREAM | Freq: Every day | CUTANEOUS | 2 refills | Status: DC

## 2023-10-15 MED ORDER — FLUCONAZOLE 100 MG PO TABS: 100.0000 mg | ORAL_TABLET | Freq: Every day | ORAL | 0 refills | Status: DC

## 2023-10-15 NOTE — Progress Notes (Signed)
 Patient ID: [name], male   DOB: 02/25/1956, 67 y.o.   MRN: [id number]  Virtual Visit via Video Note  I connected with [name] on 10/15/23 at  1:00 PM EST by a video enabled telemedicine application and verified that I am speaking with the correct person using two identifiers.  Location of all participants today Patient: at home Provider: at office   I discussed the limitations of evaluation and management by telemedicine and the availability of in person appointments. The patient expressed understanding and agreed to proceed.  History of Present Illness: Pt denies chest pain, increased sob or doe, wheezing, orthopnea, PND, increased LE swelling, palpitations, dizziness or syncope.   Pt denies polydipsia, polyuria, or new focal neuro s/s.    Pt denies fever, wt loss, night sweats, loss of appetite, or other constitutional symptoms  Has worsening difficulty sleepig, needs refill trazodone  but  has new pharmacy as prior is now closed.  Also intertrigo rash has recurred and did well prior with diflucan  oral, but nystatin  powder has not seemed to work well in controlling the recurrence.  Past Medical History:  Diagnosis Date   AICD (automatic cardioverter/defibrillator) present    Anemia    supposed to be taking Vit B but doesn't   ANXIETY    takes Xanax  nightly   Arthritis    Asthma    Albuterol  prn and Advair  daily;also takes Prednisone  daily   Cardiomyopathy [facility])    a. EF 25% TEE July 2013; b. EF normalized 2015;  c. 03/2015 Echo: EF 40-45%, difrf HK, PASP 38 mmHg, Mild MR, sev LAE/RAE.   Chronic constipation    takes OTC stool softener   COPD (chronic obstructive pulmonary disease) (HCC)    one dr says COPD; one dr says emphysema (09/18/2017)   COVID-19 12/09/2019   DEPRESSION    takes Zoloft  and Doxepin  daily   Diverticulitis    DYSKINESIA, ESOPHAGUS    Essential hypertension        FIBROMYALGIA    GERD (gastroesophageal reflux disease)        Glaucoma     HYPERLIPIDEMIA    a. Intolerant to statins.   INSOMNIA    takes Ambien  nightly   Myocardial infarction [facility])    a. 2012 Myoview  notable for prior infarct;  b. 03/2015 Lexiscan  CL: EF 37%, diff HK, small area of inferior infarct from apex to base-->Med Rx.   O2 dependent    2.5L q hs & prn (09/18/2017)   Paroxysmal atrial fibrillation (HCC)    a. CHA2DS2VASc = 3--> takes Coumadin ;  b. 03/15/2015 Successful TEE/DCCV;  c. 03/2015 recurrent afib, Amio d/c'd in setting of hyperthyroidism.   Peripheral neuropathy    Pneumonia 12/2016   Rash and other nonspecific skin eruption 04/12/2009   no cause found saw dermatologists x 2 and allergist   SLEEP APNEA, OBSTRUCTIVE    a. doesn't use CPAP   Syncope    a. 03/2015 s/p MDT LINQ.   Type II diabetes mellitus (HCC)        Past Surgical History:  Procedure Laterality Date   ACNE CYST REMOVAL     2 on back    AV NODE ABLATION N/A 10/25/2017   Procedure: AV NODE ABLATION;  Surgeon: Fernande Elspeth BROCKS, MD;  Location: [facility] INVASIVE CV LAB;  Service: Cardiovascular;  Laterality: N/A;   BIV ICD INSERTION CRT-D N/A 09/18/2017   Procedure: BIV ICD INSERTION CRT-D;  Surgeon: Fernande Elspeth BROCKS, MD;  Location: [facility] INVASIVE CV  LAB;  Service: Cardiovascular;  Laterality: N/A;   CARDIAC CATHETERIZATION N/A 03/21/2016   Procedure: Right/Left Heart Cath and Coronary Angiography;  Surgeon: Ezra GORMAN Shuck, MD;  Location: [facility] INVASIVE CV LAB;  Service: Cardiovascular;  Laterality: N/A;   CARDIOVERSION  04/18/2012   Procedure: CARDIOVERSION;  Surgeon: Vina LULLA Gull, MD;  Location: [facility] OR;  Service: Cardiovascular;  Laterality: N/A;   CARDIOVERSION  04/25/2012   Procedure: CARDIOVERSION;  Surgeon: Aleene JINNY Passe, MD;  Location: [facility] ENDOSCOPY;  Service: Cardiovascular;  Laterality: N/A;   CARDIOVERSION  04/25/2012   Procedure: CARDIOVERSION;  Surgeon: Vina LULLA Gull, MD;  Location: [facility] OR;  Service: Cardiovascular;  Laterality: N/A;   CARDIOVERSION  05/09/2012   Procedure: CARDIOVERSION;   Surgeon: Ozell Fell, MD;  Location: [facility] OR;  Service: Cardiovascular;  Laterality: N/A;  changed from crenshaw to cooper by trish/leone-endo   CARDIOVERSION N/A 03/15/2015   Procedure: CARDIOVERSION;  Surgeon: Aleene JINNY Passe, MD;  Location: [facility] ENDOSCOPY;  Service: Cardiovascular;  Laterality: N/A;   COLONOSCOPY     COLONOSCOPY WITH PROPOFOL  N/A 10/21/2014   Procedure: COLONOSCOPY WITH PROPOFOL ;  Surgeon: Gwendlyn ONEIDA Buddy, MD;  Location: [facility] ENDOSCOPY;  Service: Endoscopy;  Laterality: N/A;   EP IMPLANTABLE DEVICE N/A 04/06/2015   Procedure: Loop Recorder Insertion;  Surgeon: Danelle LELON Birmingham, MD;  Location: [facility] INVASIVE CV LAB;  Service: Cardiovascular;  Laterality: N/A;   ESOPHAGOGASTRODUODENOSCOPY     JOINT REPLACEMENT     LOOP RECORDER REMOVAL N/A 09/18/2017   Procedure: LOOP RECORDER REMOVAL;  Surgeon: Fernande Elspeth BROCKS, MD;  Location: [facility] INVASIVE CV LAB;  Service: Cardiovascular;  Laterality: N/A;   RIGHT/LEFT HEART CATH AND CORONARY ANGIOGRAPHY N/A 01/28/2017   Procedure: Right/Left Heart Cath and Coronary Angiography;  Surgeon: Ezra GORMAN Shuck, MD;  Location: [facility] INVASIVE CV LAB;  Service: Cardiovascular;  Laterality: N/A;   TEE WITHOUT CARDIOVERSION  04/25/2012   Procedure: TRANSESOPHAGEAL ECHOCARDIOGRAM (TEE);  Surgeon: Aleene JINNY Passe, MD;  Location: [facility] ENDOSCOPY;  Service: Cardiovascular;  Laterality: N/A;   TEE WITHOUT CARDIOVERSION N/A 03/15/2015   Procedure: TRANSESOPHAGEAL ECHOCARDIOGRAM (TEE);  Surgeon: Aleene JINNY Passe, MD;  Location: [facility] ENDOSCOPY;  Service: Cardiovascular;  Laterality: N/A;   TONSILLECTOMY AND ADENOIDECTOMY     TOTAL KNEE ARTHROPLASTY Right 06/15/2014   Procedure: TOTAL KNEE ARTHROPLASTY;  Surgeon: Evalene JONETTA Chancy, MD;  Location: [facility] OR;  Service: Orthopedics;  Laterality: Right;   TOTAL KNEE ARTHROPLASTY Left 10/17/2021   Procedure: Left TOTAL KNEE ARTHROPLASTY;  Surgeon: Vernetta Lonni GRADE, MD;  Location: [facility] OR;  Service: Orthopedics;  Laterality: Left;    reports  that he quit smoking about 16 years ago. His smoking use included cigarettes. He started smoking about 46 years ago. He has a 60 pack-year smoking history. He has never used smokeless tobacco. He reports that he does not drink alcohol and does not use drugs. family history includes Allergies in his mother; Asthma in his maternal grandmother and mother; COPD in his mother; Colon polyps in his mother; Hypothyroidism in his mother. Allergies  Allergen Reactions   Tape Other (See Comments)    SKIN TEARS VERY, VERY EASILY!!!! Use Paper Tape Only, PLEASE!!!   Amiodarone  Other (See Comments)    Caused hyperthyroidism    Statins Other (See Comments)    Myalgias    Sulfa  Antibiotics Other (See Comments)    Mouth ulcers   Wound Dressing Adhesive Other (See Comments)    Skin Tears Use Paper Tape Only   Current Outpatient Medications on  File Prior to Visit  Medication Sig Dispense Refill   acetaminophen  (TYLENOL ) 500 MG tablet Take 500-1,000 mg by mouth every 6 (six) hours as needed for mild pain or headache.     albuterol  (VENTOLIN  HFA) 108 (90 Base) MCG/ACT inhaler Inhale 1-2 puffs into the lungs every 6 (six) hours as needed for wheezing or shortness of breath. 18 g 5   ALPRAZolam  (XANAX ) 0.5 MG tablet TAKE 2 TABLETS (1 MG TOTAL) BY MOUTH EVERY DAY AT BEDTIME 60 tablet 5   ammonium lactate  (AMLACTIN) 12 % cream Apply 1 Application topically as needed for dry skin. 385 g 3   apixaban  (ELIQUIS ) 5 MG TABS tablet Take 1 tablet (5 mg total) by mouth 2 (two) times daily. 60 tablet 11   ascorbic acid (VITAMIN C) 500 MG tablet Take 500 mg by mouth daily.     augmented betamethasone  dipropionate (DIPROLENE -AF) 0.05 % cream Apply 1 application  topically daily as needed (to affected areas- for itching).     carvedilol  (COREG ) 3.125 MG tablet Take 1 tablet (3.125 mg total) by mouth 2 (two) times daily. 180 tablet 3   Cholecalciferol  (VITAMIN D3) 1000 units CAPS Take 1,000 Units by mouth daily.      clindamycin  (CLEOCIN ) 300 MG capsule Take 1 capsule (300 mg total) by mouth 3 (three) times daily. 30 capsule 0   clotrimazole -betamethasone  (LOTRISONE ) cream Apply 1 Application topically daily. 30 g 0   doxepin  (SINEQUAN ) 10 MG capsule TAKE 2 CAPSULES BY MOUTH AT BEDTIME AS NEEDED 180 capsule 1   DULoxetine  (CYMBALTA ) 60 MG capsule TAKE 1 CAPSULE BY MOUTH EVERY DAY 90 capsule 2   Dupilumab (DUPIXENT) 300 MG/2ML SOPN Inject 300 mg into the skin every 14 (fourteen) days.     eplerenone  (INSPRA ) 25 MG tablet Take 2 tablets (50 mg total) by mouth daily. 90 tablet 8   Evolocumab  (REPATHA  SURECLICK) 140 MG/ML SOAJ Inject 140 mg into the skin every 14 (fourteen) days. 6 mL 3   fluticasone  (FLONASE ) 50 MCG/ACT nasal spray Place 2 sprays into both nostrils daily as needed for allergies or rhinitis. 48 mL 1   fluticasone -salmeterol (WIXELA INHUB) 250-50 MCG/ACT AEPB Inhale 1 puff into the lungs in the morning and at bedtime. 60 each 11   hydrOXYzine  (ATARAX ) 10 MG tablet TAKE 1 TABLET BY MOUTH 3 TIMES DAILY AS NEEDED FOR ITCHING. Strength: 10 mg 60 tablet 2   ipratropium-albuterol  (DUONEB) 0.5-2.5 (3) MG/3ML SOLN TAKE 3 ML BY NEBULIZATION EVERY 4 HOURS AS NEEDED (WHEEZING). 360 mL 0   losartan  (COZAAR ) 25 MG tablet Take 1 tablet (25 mg total) by mouth daily. 90 tablet 3   multivitamin (ONE-A-DAY MEN'S) TABS tablet Take 1 tablet by mouth daily.     nystatin  (MYCOSTATIN /NYSTOP ) powder Use as directed twice per day as needed to the affected area 60 g 2   omeprazole  (PRILOSEC) 20 MG capsule TAKE 1 CAPSULE BY MOUTH TWICE A DAY 180 capsule 1   potassium chloride  SA (KLOR-CON  M20) 20 MEQ tablet Take 1 tablet (20 mEq total) by mouth daily. TAKE (2 TABLETS) IN THE MORNING 60 tablet 3   predniSONE  (DELTASONE ) 10 MG tablet Take 1 tablet (10 mg total) by mouth daily with breakfast. 30 tablet 0   Respiratory Therapy Supplies (FULL KIT NEBULIZER SET) MISC 1 each by Does not apply route as needed. 1 each 0    silver  sulfADIAZINE  (SILVADENE ) 1 % cream APPLY TOPICALLY TO AFFECTED AREA EVERY DAY 50 g 3   tamsulosin  (  FLOMAX ) 0.4 MG CAPS capsule TAKE 1 CAPSULE BY MOUTH EVERY DAY 90 capsule 3   Tiotropium Bromide  Monohydrate (SPIRIVA  RESPIMAT) 2.5 MCG/ACT AERS Inhale 2 puffs into the lungs daily. 4 g 11   tiZANidine  (ZANAFLEX ) 4 MG tablet Take 1 tablet (4 mg total) by mouth 3 (three) times daily. 270 tablet 2   torsemide  (DEMADEX ) 20 MG tablet Take 4 tablets (80 mg total) by mouth 2 (two) times daily. 240 tablet 2   traMADol  (ULTRAM ) 50 MG tablet Take 1 tablet (50 mg total) by mouth every 8 (eight) hours as needed. 90 tablet 2   traZODone  (DESYREL ) 100 MG tablet TAKE 2 TABLETS BY MOUTH AT BEDTIME AS NEEDED 180 tablet 1   TUMS 500 MG chewable tablet Chew 500-2,000 mg by mouth every 4 (four) hours as needed for indigestion or heartburn.     valACYclovir  (VALTREX ) 1000 MG tablet Take 1 tablet (1,000 mg total) by mouth 3 (three) times daily. 21 tablet 3   venlafaxine  XR (EFFEXOR -XR) 150 MG 24 hr capsule TAKE 1 CAPSULE BY MOUTH DAILY WITH BREAKFAST. 90 capsule 3   zolpidem  (AMBIEN ) 10 MG tablet Take 1 tablet (10 mg total) by mouth at bedtime as needed for sleep. 90 tablet 1   No current facility-administered medications on file prior to visit.    Observations/Objective: Alert, NAD, appropriate mood and affect, resps normal, cn 2-12 intact, moves all 4s, no visible rash or swelling Lab Results  Component Value Date   WBC 10.7 (H) 07/01/2023   HGB 12.9 (L) 07/01/2023   HCT 40.9 07/01/2023   PLT 271.0 07/01/2023   GLUCOSE 83 09/04/2023   CHOL 127 07/01/2023   TRIG 163.0 (H) 07/01/2023   HDL 55.70 07/01/2023   LDLDIRECT 72.0 02/28/2021   LDLCALC 38 07/01/2023   ALT 15 07/01/2023   AST 20 07/01/2023   NA 140 09/04/2023   K 4.0 09/04/2023   CL 101 09/04/2023   CREATININE 1.09 09/04/2023   BUN 26 (H) 09/04/2023   CO2 29 09/04/2023   TSH 4.79 07/01/2023   PSA 3.45 07/01/2023   INR 1.0 05/14/2023    HGBA1C 5.9 07/01/2023   MICROALBUR 1.5 07/01/2023   Assessment and Plan: See notes  Follow Up Instructions: See notes   I discussed the assessment and treatment plan with the patient. The patient was provided an opportunity to ask questions and all were answered. The patient agreed with the plan and demonstrated an understanding of the instructions.   The patient was advised to call back or seek an in-person evaluation if the symptoms worsen or if the condition fails to improve as anticipated.   Lynwood Rush, MD

## 2023-10-15 NOTE — Assessment & Plan Note (Signed)
Worsening again, for repeat diflucan, and change powder to ketocon cr prn asd

## 2023-10-15 NOTE — Patient Instructions (Signed)
 Please take all new medication as prescribed

## 2023-10-15 NOTE — Assessment & Plan Note (Signed)
Lab Results  Component Value Date   HGBA1C 5.9 07/01/2023   Stable, pt to continue current medical treatment  - diet, wt control

## 2023-10-15 NOTE — Assessment & Plan Note (Signed)
Chronic persistent , ok to continue trazodone at bedtime prn, in addtion to [place] at bedtime prn

## 2023-10-17 MED ORDER — ALBUTEROL SULFATE (2.5 MG/3ML) 0.083% IN NEBU: 2.5000 mg | INHALATION_SOLUTION | Freq: Four times a day (QID) | RESPIRATORY_TRACT | 5 refills | Status: DC | PRN

## 2023-10-17 NOTE — Telephone Encounter (Signed)
 Advised against using nebulizer solution after expiration date. Albuterol neb refilled. Closing encounter.

## 2023-10-23 ENCOUNTER — Encounter: Payer: Self-pay | Admitting: Physical Medicine & Rehabilitation

## 2023-10-23 ENCOUNTER — Ambulatory Visit: Payer: Medicare HMO

## 2023-10-23 ENCOUNTER — Telehealth: Payer: Self-pay | Admitting: Pulmonary Disease

## 2023-10-23 ENCOUNTER — Encounter: Payer: Medicare HMO | Attending: Physical Medicine & Rehabilitation | Admitting: Physical Medicine & Rehabilitation

## 2023-10-23 VITALS — BP 118/68 | HR 76 | Wt 285.0 lb

## 2023-10-23 DIAGNOSIS — G243 Spasmodic torticollis: Secondary | ICD-10-CM | POA: Insufficient documentation

## 2023-10-23 DIAGNOSIS — I442 Atrioventricular block, complete: Secondary | ICD-10-CM

## 2023-10-23 MED ORDER — CARISOPRODOL 350 MG PO TABS: 350.0000 mg | ORAL_TABLET | Freq: Four times a day (QID) | ORAL | 1 refills | Status: DC

## 2023-10-23 NOTE — Progress Notes (Signed)
 No ICM remote transmission received for 10/21/2023 and next ICM transmission scheduled for 11/05/2023.

## 2023-10-23 NOTE — Telephone Encounter (Signed)
 [name] is now at $85, pt can not afford it, any substitutions avail ?

## 2023-10-23 NOTE — Progress Notes (Signed)
 Subjective:    Patient ID: [name], male    DOB: 07-May-1956, 68 y.o.   MRN: [id number]  HPI   [name] is here in follow up of his cervical dystonia. He says tizanidine  isn't helping and he refuses to go to outpt therapies. He wants to go back on his soma . He is interested in seeing neurology for management of his neuropathy. A MRI of his cervical spine was ordered by primary. He has NS f/u scheduled soon.   Pain Inventory Average Pain 8 Pain Right Now 7 My pain is constant, dull, and aching  In the last 24 hours, has pain interfered with the following? General activity 7 Relation with others 0 Enjoyment of life 6 What TIME of day is your pain at its worst? morning , daytime, evening, night, and varies Sleep (in general) Fair  Pain is worse with: some activites Pain improves with:  hot water Relief from Meds: 6  Family History  Problem Relation Age of Onset   COPD Mother    Asthma Mother    Colon polyps Mother    Allergies Mother    Hypothyroidism Mother    Asthma Maternal Grandmother    Colon cancer Neg Hx    Social History   Socioeconomic History   Marital status: Significant Other    Spouse name: Not on file   Number of children: 2   Years of education: Not on file   Highest education level: Not on file  Occupational History   Occupation: retired/disabled. prev worked in teacher, english as a foreign language.    Employer: DISABLED  Tobacco Use   Smoking status: Former    Current packs/day: 0.00    Average packs/day: 2.0 packs/day for 30.0 years (60.0 ttl pk-yrs)    Types: Cigarettes    Start date: 10/15/1977    Quit date: 10/16/2007    Years since quitting: 16.0   Smokeless tobacco: Never  Vaping Use   Vaping status: Never Used  Substance and Sexual Activity   Alcohol use: No   Drug use: No   Sexual activity: Not Currently  Other Topics Concern   Not on file  Social History Narrative   Lives with girlfriend   Social Drivers of Health   Financial Resource Strain:  Low Risk  (09/16/2023)   Overall Financial Resource Strain (CARDIA)    Difficulty of Paying Living Expenses: Not hard at all  Food Insecurity: No Food Insecurity (09/16/2023)   Hunger Vital Sign    Worried About Running Out of Food in the Last Year: Never true    Ran Out of Food in the Last Year: Never true  Transportation Needs: No Transportation Needs (09/16/2023)   PRAPARE - Administrator, Civil Service (Medical): No    Lack of Transportation (Non-Medical): No  Physical Activity: Inactive (09/16/2023)   Exercise Vital Sign    Days of Exercise per Week: 0 days    Minutes of Exercise per Session: 0 min  Stress: No Stress Concern Present (09/16/2023)   [employer] of Occupational Health - Occupational Stress Questionnaire    Feeling of Stress : Not at all  Social Connections: Moderately Isolated (09/16/2023)   Social Connection and Isolation Panel [NHANES]    Frequency of Communication with Friends and Family: Once a week    Frequency of Social Gatherings with Friends and Family: Never    Attends Religious Services: 1 to 4 times per year    Active Member of Clubs or Organizations: No  Attends Banker Meetings: Never    Marital Status: Living with partner   Past Surgical History:  Procedure Laterality Date   ACNE CYST REMOVAL     2 on back    AV NODE ABLATION N/A 10/25/2017   Procedure: AV NODE ABLATION;  Surgeon: Fernande Elspeth BROCKS, MD;  Location: [facility] INVASIVE CV LAB;  Service: Cardiovascular;  Laterality: N/A;   BIV ICD INSERTION CRT-D N/A 09/18/2017   Procedure: BIV ICD INSERTION CRT-D;  Surgeon: Fernande Elspeth BROCKS, MD;  Location: [facility] INVASIVE CV LAB;  Service: Cardiovascular;  Laterality: N/A;   CARDIAC CATHETERIZATION N/A 03/21/2016   Procedure: Right/Left Heart Cath and Coronary Angiography;  Surgeon: Ezra GORMAN Shuck, MD;  Location: [facility] INVASIVE CV LAB;  Service: Cardiovascular;  Laterality: N/A;   CARDIOVERSION  04/18/2012   Procedure: CARDIOVERSION;  Surgeon:  Vina LULLA Gull, MD;  Location: [facility] OR;  Service: Cardiovascular;  Laterality: N/A;   CARDIOVERSION  04/25/2012   Procedure: CARDIOVERSION;  Surgeon: Aleene JINNY Passe, MD;  Location: [facility] ENDOSCOPY;  Service: Cardiovascular;  Laterality: N/A;   CARDIOVERSION  04/25/2012   Procedure: CARDIOVERSION;  Surgeon: Vina LULLA Gull, MD;  Location: [facility] OR;  Service: Cardiovascular;  Laterality: N/A;   CARDIOVERSION  05/09/2012   Procedure: CARDIOVERSION;  Surgeon: Ozell Fell, MD;  Location: [facility] OR;  Service: Cardiovascular;  Laterality: N/A;  changed from crenshaw to cooper by trish/leone-endo   CARDIOVERSION N/A 03/15/2015   Procedure: CARDIOVERSION;  Surgeon: Aleene JINNY Passe, MD;  Location: [facility] ENDOSCOPY;  Service: Cardiovascular;  Laterality: N/A;   COLONOSCOPY     COLONOSCOPY WITH PROPOFOL  N/A 10/21/2014   Procedure: COLONOSCOPY WITH PROPOFOL ;  Surgeon: Gwendlyn ONEIDA Buddy, MD;  Location: [facility] ENDOSCOPY;  Service: Endoscopy;  Laterality: N/A;   EP IMPLANTABLE DEVICE N/A 04/06/2015   Procedure: Loop Recorder Insertion;  Surgeon: Danelle LELON Birmingham, MD;  Location: [facility] INVASIVE CV LAB;  Service: Cardiovascular;  Laterality: N/A;   ESOPHAGOGASTRODUODENOSCOPY     JOINT REPLACEMENT     LOOP RECORDER REMOVAL N/A 09/18/2017   Procedure: LOOP RECORDER REMOVAL;  Surgeon: Fernande Elspeth BROCKS, MD;  Location: [facility] INVASIVE CV LAB;  Service: Cardiovascular;  Laterality: N/A;   RIGHT/LEFT HEART CATH AND CORONARY ANGIOGRAPHY N/A 01/28/2017   Procedure: Right/Left Heart Cath and Coronary Angiography;  Surgeon: Ezra GORMAN Shuck, MD;  Location: [facility] INVASIVE CV LAB;  Service: Cardiovascular;  Laterality: N/A;   TEE WITHOUT CARDIOVERSION  04/25/2012   Procedure: TRANSESOPHAGEAL ECHOCARDIOGRAM (TEE);  Surgeon: Aleene JINNY Passe, MD;  Location: [facility] ENDOSCOPY;  Service: Cardiovascular;  Laterality: N/A;   TEE WITHOUT CARDIOVERSION N/A 03/15/2015   Procedure: TRANSESOPHAGEAL ECHOCARDIOGRAM (TEE);  Surgeon: Aleene JINNY Passe, MD;  Location: [facility] ENDOSCOPY;  Service:  Cardiovascular;  Laterality: N/A;   TONSILLECTOMY AND ADENOIDECTOMY     TOTAL KNEE ARTHROPLASTY Right 06/15/2014   Procedure: TOTAL KNEE ARTHROPLASTY;  Surgeon: Evalene JONETTA Chancy, MD;  Location: [facility] OR;  Service: Orthopedics;  Laterality: Right;   TOTAL KNEE ARTHROPLASTY Left 10/17/2021   Procedure: Left TOTAL KNEE ARTHROPLASTY;  Surgeon: Vernetta Lonni GRADE, MD;  Location: [facility] OR;  Service: Orthopedics;  Laterality: Left;   Past Surgical History:  Procedure Laterality Date   ACNE CYST REMOVAL     2 on back    AV NODE ABLATION N/A 10/25/2017   Procedure: AV NODE ABLATION;  Surgeon: Fernande Elspeth BROCKS, MD;  Location: [facility] INVASIVE CV LAB;  Service: Cardiovascular;  Laterality: N/A;   BIV ICD INSERTION CRT-D N/A 09/18/2017   Procedure: BIV  ICD INSERTION CRT-D;  Surgeon: Fernande Elspeth BROCKS, MD;  Location: [facility] INVASIVE CV LAB;  Service: Cardiovascular;  Laterality: N/A;   CARDIAC CATHETERIZATION N/A 03/21/2016   Procedure: Right/Left Heart Cath and Coronary Angiography;  Surgeon: Ezra GORMAN Shuck, MD;  Location: [facility] INVASIVE CV LAB;  Service: Cardiovascular;  Laterality: N/A;   CARDIOVERSION  04/18/2012   Procedure: CARDIOVERSION;  Surgeon: Vina LULLA Gull, MD;  Location: [facility] OR;  Service: Cardiovascular;  Laterality: N/A;   CARDIOVERSION  04/25/2012   Procedure: CARDIOVERSION;  Surgeon: Aleene JINNY Passe, MD;  Location: [facility] ENDOSCOPY;  Service: Cardiovascular;  Laterality: N/A;   CARDIOVERSION  04/25/2012   Procedure: CARDIOVERSION;  Surgeon: Vina LULLA Gull, MD;  Location: [facility] OR;  Service: Cardiovascular;  Laterality: N/A;   CARDIOVERSION  05/09/2012   Procedure: CARDIOVERSION;  Surgeon: Ozell Fell, MD;  Location: [facility] OR;  Service: Cardiovascular;  Laterality: N/A;  changed from crenshaw to cooper by trish/leone-endo   CARDIOVERSION N/A 03/15/2015   Procedure: CARDIOVERSION;  Surgeon: Aleene JINNY Passe, MD;  Location: [facility] ENDOSCOPY;  Service: Cardiovascular;  Laterality: N/A;   COLONOSCOPY     COLONOSCOPY WITH PROPOFOL  N/A  10/21/2014   Procedure: COLONOSCOPY WITH PROPOFOL ;  Surgeon: Gwendlyn ONEIDA Buddy, MD;  Location: [facility] ENDOSCOPY;  Service: Endoscopy;  Laterality: N/A;   EP IMPLANTABLE DEVICE N/A 04/06/2015   Procedure: Loop Recorder Insertion;  Surgeon: Danelle LELON Birmingham, MD;  Location: [facility] INVASIVE CV LAB;  Service: Cardiovascular;  Laterality: N/A;   ESOPHAGOGASTRODUODENOSCOPY     JOINT REPLACEMENT     LOOP RECORDER REMOVAL N/A 09/18/2017   Procedure: LOOP RECORDER REMOVAL;  Surgeon: Fernande Elspeth BROCKS, MD;  Location: [facility] INVASIVE CV LAB;  Service: Cardiovascular;  Laterality: N/A;   RIGHT/LEFT HEART CATH AND CORONARY ANGIOGRAPHY N/A 01/28/2017   Procedure: Right/Left Heart Cath and Coronary Angiography;  Surgeon: Ezra GORMAN Shuck, MD;  Location: [facility] INVASIVE CV LAB;  Service: Cardiovascular;  Laterality: N/A;   TEE WITHOUT CARDIOVERSION  04/25/2012   Procedure: TRANSESOPHAGEAL ECHOCARDIOGRAM (TEE);  Surgeon: Aleene JINNY Passe, MD;  Location: [facility] ENDOSCOPY;  Service: Cardiovascular;  Laterality: N/A;   TEE WITHOUT CARDIOVERSION N/A 03/15/2015   Procedure: TRANSESOPHAGEAL ECHOCARDIOGRAM (TEE);  Surgeon: Aleene JINNY Passe, MD;  Location: [facility] ENDOSCOPY;  Service: Cardiovascular;  Laterality: N/A;   TONSILLECTOMY AND ADENOIDECTOMY     TOTAL KNEE ARTHROPLASTY Right 06/15/2014   Procedure: TOTAL KNEE ARTHROPLASTY;  Surgeon: Evalene JONETTA Chancy, MD;  Location: [facility] OR;  Service: Orthopedics;  Laterality: Right;   TOTAL KNEE ARTHROPLASTY Left 10/17/2021   Procedure: Left TOTAL KNEE ARTHROPLASTY;  Surgeon: Vernetta Lonni GRADE, MD;  Location: [facility] OR;  Service: Orthopedics;  Laterality: Left;   Past Medical History:  Diagnosis Date   AICD (automatic cardioverter/defibrillator) present    Anemia    supposed to be taking Vit B but doesn't   ANXIETY    takes Xanax  nightly   Arthritis    Asthma    Albuterol  prn and Advair  daily;also takes Prednisone  daily   Cardiomyopathy [facility])    a. EF 25% TEE July 2013; b. EF normalized 2015;  c. 03/2015 Echo: EF  40-45%, difrf HK, PASP 38 mmHg, Mild MR, sev LAE/RAE.   Chronic constipation    takes OTC stool softener   COPD (chronic obstructive pulmonary disease) (HCC)    one dr says COPD; one dr says emphysema (09/18/2017)   COVID-19 12/09/2019   DEPRESSION    takes Zoloft  and Doxepin  daily   Diverticulitis    DYSKINESIA, ESOPHAGUS  Essential hypertension        FIBROMYALGIA    GERD (gastroesophageal reflux disease)        Glaucoma    HYPERLIPIDEMIA    a. Intolerant to statins.   INSOMNIA    takes Ambien  nightly   Myocardial infarction [facility])    a. 2012 Myoview  notable for prior infarct;  b. 03/2015 Lexiscan  CL: EF 37%, diff HK, small area of inferior infarct from apex to base-->Med Rx.   O2 dependent    2.5L q hs & prn (09/18/2017)   Paroxysmal atrial fibrillation (HCC)    a. CHA2DS2VASc = 3--> takes Coumadin ;  b. 03/15/2015 Successful TEE/DCCV;  c. 03/2015 recurrent afib, Amio d/c'd in setting of hyperthyroidism.   Peripheral neuropathy    Pneumonia 12/2016   Rash and other nonspecific skin eruption 04/12/2009   no cause found saw dermatologists x 2 and allergist   SLEEP APNEA, OBSTRUCTIVE    a. doesn't use CPAP   Syncope    a. 03/2015 s/p MDT LINQ.   Type II diabetes mellitus (HCC)        BP 118/68   Pulse 76   Wt 285 lb (129.3 kg)   SpO2 94%   BMI 40.89 kg/m   Opioid Risk Score:   Fall Risk Score:  `1  Depression screen [facility] 2/9     10/23/2023    2:43 PM 09/16/2023    3:45 PM 08/14/2023    2:32 PM 07/01/2023    1:27 PM 03/01/2023    2:28 PM 12/28/2022    1:33 PM 12/18/2022   10:06 AM  Depression screen PHQ 2/9  Decreased Interest 0 0 0 0 0 0 0  Down, Depressed, Hopeless  2 0 0 0 0 0  PHQ - 2 Score 0 2 0 0 0 0 0  Altered sleeping  1    0   Tired, decreased energy  0    0   Change in appetite  3    0   Feeling bad or failure about yourself   0    0   Trouble concentrating  0    0   Moving slowly or fidgety/restless  0    0   Suicidal thoughts  0    0   PHQ-9 Score   6    0   Difficult doing work/chores  Not difficult at all    Not difficult at all     Review of Systems  Musculoskeletal:  Positive for back pain and gait problem.       Neck & spine pain  All other systems reviewed and are negative.      Objective:   Physical Exam General: No acute distress HEENT: NCAT, EOMI, oral membranes moist Cards: reg rate  Chest: normal effort Abdomen: Soft, NT, ND Skin: dry, intact Extremities: no edema Psych: cooperative Musc: right sided head lean, SCM tightness. Uses cane for balance       Assessment & Plan:  1. Chronic thoracic and lumbar spine pain. He has DDD of the thoracic spine on plain film which we reviewed today. The most affected levels appear to be T3-6. His MRI shows no acute disease, and the most affected levels remain below the area where is pain is presenting.   2. Myofascial pain in thoracic paraspinals, maladaptive posture syndrome   3. Lumbar-thoracic scoliosis   4. ?RA- dxed by rheumatologist.   5. Obesity   6. OA of the bilateral knees, left more than right.  7. Major depression 8.  Patient with fall over the summer with cervical spine fractures.  Fractures appeared somewhat stable on most recent CT from September.  As noted above this is someone who already had a maladaptive posture issue.  Now he is has significant cervical dystonia involving his right sternocleidomastoid and levator scapula amongst other muscles.      Plan:   1.  Patient feels that he is spinning his wheels and prefers to go back on his Soma  which has worked somewhat for his neck pain.  He does not want to participate in physical therapy as he is doing his own stretching at home.  He reports that I kicked him out last time from this practice and feels that I am not taking him seriously once again.  I reviewed our last note with him in February 2016 which indicates nothing of the sort.  I will let him follow-up with another provider.  I did give him a  prescription for Soma  with another month of refills.  No follow-up.

## 2023-10-24 LAB — CUP PACEART REMOTE DEVICE CHECK
Battery Remaining Longevity: 11 mo
Battery Voltage: 2.87 V
Brady Statistic AP VP Percent: 0 %
Brady Statistic AP VS Percent: 0 %
Brady Statistic AS VP Percent: 0 %
Brady Statistic AS VS Percent: 0 %
Brady Statistic RA Percent Paced: 0 %
Brady Statistic RV Percent Paced: 99.04 %
Date Time Interrogation Session: 20250108121706
HighPow Impedance: 89 Ohm
Implantable Lead Connection Status: 753985
Implantable Lead Connection Status: 753985
Implantable Lead Implant Date: 20181205
Implantable Lead Implant Date: 20181205
Implantable Lead Location: 753858
Implantable Lead Location: 753860
Implantable Lead Model: 4398
Implantable Pulse Generator Implant Date: 20181205
Lead Channel Impedance Value: 1140 Ohm
Lead Channel Impedance Value: 1140 Ohm
Lead Channel Impedance Value: 1197 Ohm
Lead Channel Impedance Value: 1235 Ohm
Lead Channel Impedance Value: 261.164
Lead Channel Impedance Value: 309.309
Lead Channel Impedance Value: 315.129
Lead Channel Impedance Value: 316.116
Lead Channel Impedance Value: 322.197
Lead Channel Impedance Value: 342 Ohm
Lead Channel Impedance Value: 4047 Ohm
Lead Channel Impedance Value: 418 Ohm
Lead Channel Impedance Value: 513 Ohm
Lead Channel Impedance Value: 532 Ohm
Lead Channel Impedance Value: 779 Ohm
Lead Channel Impedance Value: 817 Ohm
Lead Channel Impedance Value: 874 Ohm
Lead Channel Impedance Value: 931 Ohm
Lead Channel Pacing Threshold Amplitude: 0.375 V
Lead Channel Pacing Threshold Pulse Width: 0.4 ms
Lead Channel Sensing Intrinsic Amplitude: 17.125 mV
Lead Channel Sensing Intrinsic Amplitude: 17.125 mV
Lead Channel Setting Pacing Amplitude: 2.5 V
Lead Channel Setting Pacing Amplitude: 2.5 V
Lead Channel Setting Pacing Pulse Width: 0.4 ms
Lead Channel Setting Pacing Pulse Width: 1 ms
Lead Channel Setting Sensing Sensitivity: 0.3 mV
Zone Setting Status: 755011

## 2023-10-25 ENCOUNTER — Telehealth: Payer: Self-pay

## 2023-10-25 ENCOUNTER — Ambulatory Visit: Payer: Medicare HMO | Attending: Internal Medicine

## 2023-10-25 ENCOUNTER — Telehealth (HOSPITAL_COMMUNITY): Payer: Self-pay

## 2023-10-25 ENCOUNTER — Other Ambulatory Visit (HOSPITAL_COMMUNITY): Payer: Self-pay

## 2023-10-25 DIAGNOSIS — Z95810 Presence of automatic (implantable) cardiac defibrillator: Secondary | ICD-10-CM | POA: Diagnosis not present

## 2023-10-25 DIAGNOSIS — I5022 Chronic systolic (congestive) heart failure: Secondary | ICD-10-CM

## 2023-10-25 NOTE — Telephone Encounter (Signed)
 Pharmacy Patient Advocate Encounter  Insurance verification completed.    The patient is insured through [employer]. Patient has Medicare and is not eligible for a copay card, but may be able to apply for patient assistance or Medicare RX Payment Plan (Patient Must reach out to their plan, if eligible for payment plan), if available.    Ran test claim for Generic Advair  Diskus (brand requires PA) and the current 30 day co-pay is $84.64.  Ran test claim for Brand name Symbicort and the current 30 day co-pay is $184.10.  Ran test claim for Brand name Breo Ellipta  and the current 30 day co-pay is $184.10.  This test claim was processed through [facility]- copay amounts may vary at other pharmacies due to pharmacy/plan contracts, or as the patient moves through the different stages of their insurance plan.

## 2023-10-25 NOTE — Telephone Encounter (Signed)
 Remote ICM transmission received.  Attempted call to patient regarding ICM remote transmission and left detailed message per DPR.  Left ICM phone number and advised to return call for any fluid symptoms or questions. Next ICM remote transmission scheduled 11/25/2023.

## 2023-10-25 NOTE — Progress Notes (Signed)
 EPIC Encounter for ICM Monitoring  Patient Name: [name] is a 68 y.o. male Date: 10/25/2023 Primary Care Physican: Norleen Lynwood ORN, MD Primary Cardiologist: Rolan Electrophysiologist: Fernande Pore Pacing: 98.8%    07/09/2023 Weight: 280 lbs     Attempted call to patient and unable to reach.  Left detailed message per DPR regarding transmission.  Transmission results reviewed.    Optivol thoracic impedance suggesting intermittent days with possible fluid accumulation within the last month.     Prescribed:  Torsemide  20 mg 4 tablet(s) (80 mg total) by mouth twice day.     Potassium 20 mEq take 2 tablets (40 mEq total) daily   Labs: 09/04/2023 Creatinine 1.09, BUN 26, Potassium 4.0, Sodium 140 07/01/2023 Creatinine 1.09, BUN 20, Potassium 5.7, Sodium 142, GFR 69.72 (Potassium addressed by PCP, held K+ x 5 days and restarted at 40 mEq daily) 06/19/2023 Creatinine 1.38, BUN 21, Potassium 5.0, Sodium 134 05/15/2023 Creatinine 0.99, BUN 29, Potassium 3.8, Sodium 138, GFR >60 05/14/2023 Creatinine 1.23, BUN 37, Potassium 4.2, Sodium 138, GFR >60 03/22/2023 Creatinine 1.16, BUN 20, Potassium 4.0, Sodium 138, GFR >60 02/18/2023 Creatinine 1.20, BUN 23, Potassium 4.4, Sodium 135, GFR >60 01/25/2023 Creatinine 1.22, BUN 37, Potassium 3.6, Sodium 138, GFR >60 01/15/2023 Creatinine 1.38, BUN 22, Potassium 4.6, Sodium 134, GFR 52.70  12/31/2022 Creatinine 1.27, BUN 36, Potassium 5.2, Sodium 136, GFR 58.24  12/18/2022 Creatinine 1.13, BUN 36, Potassium 4.5, Sodium 135  11/24/2022 Creatinine 1.45, BUN 45, Potassium 3.2, Sodium 135, GFR 53 (12:09 PM) 11/24/2022 Creatinine 1.59, BUN 51, Potassium 4.0, Sodium 138, GFR 47 (6:01 AM) A complete set of results can be found in Results Review.   Recommendations:   Left voice mail with ICM number and encouraged to call if experiencing any fluid symptoms.   Follow-up plan: ICM clinic phone appointment on 11/25/2023.   91 day device clinic remote  transmission 01/22/2024.   EP/Cardiology Office Visits:    12/13/2023 with Dr Fernande. 11/04/2023 with HF clinic.  Copy of ICM check sent to Dr. Fernande.   3 month ICM trend: 10/23/2023.    12-14 Month ICM trend:     Mitzie GORMAN Garner, RN 10/25/2023 12:05 PM

## 2023-10-28 DIAGNOSIS — E119 Type 2 diabetes mellitus without complications: Secondary | ICD-10-CM | POA: Diagnosis not present

## 2023-10-28 NOTE — Progress Notes (Signed)
ADVANCED HF CLINIC NOTE   ID:  [name], DOB 05-08-1956, MRN [id number]  Provider location: [facility] Type of Visit: Established patient  PCP:  [name], MD  Cardiologist:  Dr. Shirlee Latch  Chief complaint: Heart failure follow up    History of Present Illness: [name] is a 68 y.o. male who has history of COPD on home oxygen, permanent atrial fibrillation, and chronic systolic CHF.  Amiodarone and DCCV were tried for atrial fibrillation in the past without success.   Patient has been noted to have a low EF since 2013.  EF improved to normal by 2015 but dropped to 30-35% by 11/16.  Cath in 6/17 showed some CAD but not enough to cause cardiomyopathy.  He was admitted in 3/18 with PNA, atrial fibrillation/RVR, and acute on chronic systolic CHF.  Echo in 3/18 showed fall in EF to 10-15%.   Underwent right and left heart cath in 4/18, which showed nonobstructive CAD and relatively optimized filling pressures.    Syncope in 11/18. Loop recorder showed 6 second pause and periods of complete heart block. He had Medtronic BiV ICD placed in 12/18.  In 1/19, he had AV nodal ablation to promote BiV pacing.   Echo was done 6/19 with EF 25-30% with moderate RV dilation/mildly decreased systolic function. CPX 7/19 was submaximal, probably mild functional impairment.    Echo in 1/21 showed EF 35-40% with diffuse hypokinesis, moderate RV dilation with mildly decreased systolic function.   Admitted with COVID-19 PNA in 2/19.  He had AKI/hypotension and Entresto was stopped.   Echo in 3/22 showed EF 40% with diffuse hypokinesis, mild RV enlargement, mildly decreased RV systolic function.   Presented to [facility] 7/22 with weakness and dizziness. Found to be in hemorrhagic shock from acute GIB. Hgb 5.0, INR 8.4. He was on Coumadin for atrial fibrillation. Resuscitated with PRBCs, IVF, and given Vitamin K. GDMT held d/t hypotension. Initially required ICU due to hypotension  requiring pressors. GI consulted and planned to treat with oral antibiotics for suspected diverticular bleed with plans for outpatient colonoscopy.  Coumadin was transitioned to Eliquis.  Discharged back on beta blocker, Entresto and SGLT2i. Dig, spiro and torsemide were held at discharge. Weight 259 lbs.  S/p TKR 1/23  Follow up 12/23, NYHA II-early III, volume overloaded and torsemide increased to 80 bid.   Echo 1/24 EF 40%, RV normal, no significant change from prior.  Admitted 2/24 w/ RLE cellulitis, failed outpatient abx. Received IV eventually transitioned to oral cefadroxil. Followed by ID, saw [name] 12/18/22 and arranged for CT of RLE to rule out deep tissue abscess. Finished abx course.  Today he returns for AHF follow up with his wife. Overall feeling ok. Denies palpitations, CP, or PND/Orthopnea. Occasional edema. Occasional dizziness at home, resolves with rest. SOB at baseline, worsens with exertion. Appetite good. No fever or chills. Weight at home 285 pounds. Taking all medications sometimes missed PM doses. Denies ETOH, tobacco or drug use. Echo today, official read pending. [place] but he's still unable to walk too far without SOB.   MDT device interrogation (personally reviewed): 98.9% BiV pacing, stable thoracic impedance and optivol, no VT, minimal activity, 0.2 hr/day.  ECG (personally reviewed from 11/24): Atrial fibrillation with BiV pacing  PMH: 1. Asthma 2. COPD: On home oxygen. Prior smoker.  3. Atrial fibrillation: Permanent.  He failed DCCV and amiodarone in the past . 4. HTN 5. GERD  6. Hyperlipidemia: Myalgias with atorvastatin 7. BPH.  8. Chronic systolic CHF: Nonischemic cardiomyopathy.  - EF 25% by TEE in 7/13.  Normalized on 2015 echo. - Echo (11/16): EF 30-35%.  - LHC/RHC (6/17): 80% ostial stenosis small D1, 30-40% pLAD.  Mean RA 4, PA 26/10, mean PCWP 11, CI 2.33.  - Echo (3/18): EF 10-15%, diffuse hypokinesis, severe LV dilation,  moderate RV dilation with severely decreased systolic function.  - LHC/RHC (4/18): 40% proximal LAD.  Mean RA 8, PA 37/10 mean 22, mean PCWP 22, LVEDP 9, CI 2.39. - Medtronic BiV ICD placement in 12/18 with AV nodal ablation to promote BiV pacing in 1/19.  - Echo (6/19): EF 25-30% with mild LV dilation, moderately dilated RV with mildly decreased systolic function, IVC not dilated, PASP 23 mmHg.  - CPX (7/19): peak VO2 17.6 (78% predicted), VE/VCO2 slope 31, RER 1.02 => submaximal, probably mild functional impairment.  - Echo (1/21): EF 35-40% with diffuse hypokinesis, moderate RV dilation with mildly decreased systolic function. - Echo (3/22): EF 40% with diffuse hypokinesis, mild RV enlargement, mildly decreased RV systolic function.  - Echo (1/24): EF 40%, RV normal 9. Morbid obesity 10. ILR in place.  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 Complete heart block: s/p Medtronic CRT-D.  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 OSA: Untreated, unable to tolerate CPAP.  15. COVID-19 PNA 2/21.  16. Hyperlipidemia: Myalgias with multiple statins.  17. PAD: ABIs 10/21 noncompressible on right but TBI normal, normal ABI and TBI on left.  18. GI bleeding: 7/22, diverticulosis.  19. Left TKR (1/23)  Social History   Socioeconomic History   Marital status: Significant Other    Spouse name: Not on file   Number of children: 2   Years of education: Not on file   Highest education level: Not on file  Occupational History   Occupation: retired/disabled. prev worked in Teacher, English as a foreign language.    Employer: DISABLED  Tobacco Use   Smoking status: Former    Current packs/day: 0.00    Average packs/day: 2.0 packs/day for 30.0 years (60.0 ttl pk-yrs)    Types: Cigarettes    Start date: 10/15/1977    Quit date: 10/16/2007    Years since quitting: 16.0   Smokeless tobacco: Never  Vaping Use   Vaping status: Never Used  Substance and Sexual Activity   Alcohol use: No   Drug use: No   Sexual activity: Not  Currently  Other Topics Concern   Not on file  Social History Narrative   Lives with girlfriend   Social Drivers of Health   Financial Resource Strain: Low Risk  (09/16/2023)   Overall Financial Resource Strain (CARDIA)    Difficulty of Paying Living Expenses: Not hard at all  Food Insecurity: No Food Insecurity (09/16/2023)   Hunger Vital Sign    Worried About Running Out of Food in the Last Year: Never true    Ran Out of Food in the Last Year: Never true  Transportation Needs: No Transportation Needs (09/16/2023)   PRAPARE - Administrator, Civil Service (Medical): No    Lack of Transportation (Non-Medical): No  Physical Activity: Inactive (09/16/2023)   Exercise Vital Sign    Days of Exercise per Week: 0 days    Minutes of Exercise per Session: 0 min  Stress: No Stress Concern Present (09/16/2023)   [employer] of Occupational Health - Occupational Stress Questionnaire    Feeling of Stress : Not at all  Social Connections: Moderately Isolated (09/16/2023)  Social Advertising account executive [NHANES]    Frequency of Communication with Friends and Family: Once a week    Frequency of Social Gatherings with Friends and Family: Never    Attends Religious Services: 1 to 4 times per year    Active Member of [employer] or Organizations: No    Attends Banker Meetings: Never    Marital Status: Living with partner  Intimate Partner Violence: Not At Risk (09/16/2023)   Humiliation, Afraid, Rape, and Kick questionnaire    Fear of Current or Ex-Partner: No    Emotionally Abused: No    Physically Abused: No    Sexually Abused: No   Family History  Problem Relation Age of Onset   COPD Mother    Asthma Mother    Colon polyps Mother    Allergies Mother    Hypothyroidism Mother    Asthma Maternal Grandmother    Colon cancer Neg Hx    ROS: All systems reviewed and negative except as per HPI.   Current Outpatient Medications  Medication Sig Dispense Refill    acetaminophen (TYLENOL) 500 MG tablet Take 500-1,000 mg by mouth every 6 (six) hours as needed for mild pain or headache.     albuterol (PROVENTIL) (2.5 MG/3ML) 0.083% nebulizer solution Take 3 mLs (2.5 mg total) by nebulization every 6 (six) hours as needed for wheezing or shortness of breath. 360 mL 5   albuterol (VENTOLIN HFA) 108 (90 Base) MCG/ACT inhaler Inhale 1-2 puffs into the lungs every 6 (six) hours as needed for wheezing or shortness of breath. 18 g 5   ALPRAZolam (XANAX) 0.5 MG tablet TAKE 2 TABLETS (1 MG TOTAL) BY MOUTH EVERY DAY AT BEDTIME 60 tablet 5   ammonium lactate (AMLACTIN) 12 % cream Apply 1 Application topically as needed for dry skin. 385 g 3   apixaban (ELIQUIS) 5 MG TABS tablet Take 1 tablet (5 mg total) by mouth 2 (two) times daily. 60 tablet 11   ascorbic acid (VITAMIN C) 500 MG tablet Take 500 mg by mouth daily.     augmented betamethasone dipropionate (DIPROLENE-AF) 0.05 % cream Apply 1 application  topically daily as needed (to affected areas- for itching).     carisoprodol (SOMA) 350 MG tablet Take 1 tablet (350 mg total) by mouth 4 (four) times daily. 120 tablet 1   carvedilol (COREG) 3.125 MG tablet Take 1 tablet (3.125 mg total) by mouth 2 (two) times daily. 180 tablet 3   Cholecalciferol (VITAMIN D3) 1000 units CAPS Take 1,000 Units by mouth daily.     clotrimazole-betamethasone (LOTRISONE) cream Apply 1 Application topically daily. 30 g 0   doxepin (SINEQUAN) 10 MG capsule TAKE 2 CAPSULES BY MOUTH AT BEDTIME AS NEEDED 180 capsule 1   DULoxetine (CYMBALTA) 60 MG capsule TAKE 1 CAPSULE BY MOUTH EVERY DAY 90 capsule 2   Dupilumab (DUPIXENT) 300 MG/2ML SOPN Inject 300 mg into the skin every 14 (fourteen) days.     eplerenone (INSPRA) 25 MG tablet Take 2 tablets (50 mg total) by mouth daily. 90 tablet 8   Evolocumab (REPATHA SURECLICK) 140 MG/ML SOAJ Inject 140 mg into the skin every 14 (fourteen) days. 6 mL 3   fluconazole (DIFLUCAN) 100 MG tablet Take 1 tablet  (100 mg total) by mouth daily. 7 tablet 0   fluticasone (FLONASE) 50 MCG/ACT nasal spray Place 2 sprays into both nostrils daily as needed for allergies or rhinitis. 48 mL 1   fluticasone-salmeterol (WIXELA INHUB) 250-50 MCG/ACT AEPB Inhale  1 puff into the lungs in the morning and at bedtime. 60 each 11   hydrOXYzine (ATARAX) 10 MG tablet TAKE 1 TABLET BY MOUTH 3 TIMES DAILY AS NEEDED FOR ITCHING. Strength: 10 mg 60 tablet 2   ipratropium-albuterol (DUONEB) 0.5-2.5 (3) MG/3ML SOLN TAKE 3 ML BY NEBULIZATION EVERY 4 HOURS AS NEEDED (WHEEZING). 360 mL 0   ketoconazole (NIZORAL) 2 % cream Apply 1 Application topically daily. 60 g 2   losartan (COZAAR) 25 MG tablet Take 1 tablet (25 mg total) by mouth daily. 90 tablet 3   multivitamin (ONE-A-DAY MEN'S) TABS tablet Take 1 tablet by mouth daily.     nystatin (MYCOSTATIN/NYSTOP) powder Use as directed twice per day as needed to the affected area 60 g 2   omeprazole (PRILOSEC) 20 MG capsule TAKE 1 CAPSULE BY MOUTH TWICE A DAY 180 capsule 1   potassium chloride SA (KLOR-CON M20) 20 MEQ tablet Take 1 tablet (20 mEq total) by mouth daily. TAKE (2 TABLETS) IN THE MORNING (Patient taking differently: Take 20 mEq by mouth daily. TAKE 20 MEQ (1 TABLETS) IN THE MORNING) 60 tablet 3   predniSONE (DELTASONE) 10 MG tablet TAKE 1 TABLET (10 MG TOTAL) BY MOUTH DAILY WITH BREAKFAST. 30 tablet 0   Respiratory Therapy Supplies (FULL KIT NEBULIZER SET) MISC 1 each by Does not apply route as needed. 1 each 0   silver sulfADIAZINE (SILVADENE) 1 % cream APPLY TOPICALLY TO AFFECTED AREA EVERY DAY 50 g 3   tamsulosin (FLOMAX) 0.4 MG CAPS capsule TAKE 1 CAPSULE BY MOUTH EVERY DAY 90 capsule 3   Tiotropium Bromide Monohydrate (SPIRIVA RESPIMAT) 2.5 MCG/ACT AERS Inhale 2 puffs into the lungs daily. 4 g 11   torsemide (DEMADEX) 20 MG tablet Take 4 tablets (80 mg total) by mouth 2 (two) times daily. 240 tablet 2   traMADol (ULTRAM) 50 MG tablet Take 1 tablet (50 mg total) by  mouth every 8 (eight) hours as needed. 90 tablet 2   TUMS 500 MG chewable tablet Chew 500-2,000 mg by mouth every 4 (four) hours as needed for indigestion or heartburn.     valACYclovir (VALTREX) 1000 MG tablet Take 1 tablet (1,000 mg total) by mouth 3 (three) times daily. 21 tablet 3   venlafaxine XR (EFFEXOR-XR) 150 MG 24 hr capsule TAKE 1 CAPSULE BY MOUTH DAILY WITH BREAKFAST. 90 capsule 3   zolpidem (AMBIEN) 10 MG tablet Take 1 tablet (10 mg total) by mouth at bedtime as needed for sleep. 90 tablet 1   No current facility-administered medications for this encounter.   Facility-Administered Medications Ordered in Other Encounters  Medication Dose Route Frequency Provider Last Rate Last Admin   perflutren lipid microspheres (DEFINITY) IV suspension  1-10 mL Intravenous PRN Linda Hedges, RN   3 mL at 11/04/23 1451   Wt Readings from Last 3 Encounters:  11/04/23 129.3 kg (285 lb)  10/23/23 129.3 kg (285 lb)  09/16/23 127 kg (280 lb)   BP 106/74   Pulse 77   Wt 129.3 kg (285 lb) Comment: Patient's morning home weight.  SpO2 94%   BMI 40.89 kg/m   PHYSICAL EXAM: General:  frail appearing.  No respiratory difficulty HEENT: normal Neck: supple. JVD ~8 cm. Carotids 2+ bilat; no bruits. No lymphadenopathy or thyromegaly appreciated. Cor: PMI nondisplaced. Regular rate & irregular rhythm. No rubs, gallops or murmurs. Lungs: clear Abdomen: obese, soft, nontender, nondistended. No hepatosplenomegaly. No bruits or masses. Good bowel sounds. Extremities: no cyanosis, clubbing, rash, trace  BLE edema. + BLE wounds Neuro: alert & oriented x 3, cranial nerves grossly intact. moves all 4 extremities w/o difficulty. Affect pleasant.   Assessment/Plan:  1. Atrial fibrillation: Permanent.  Now s/p AV nodal ablation to allow effective BiV pacing.  - Continue Eliquis 5 mg bid.  2. COPD: No longer smoking.  Not using oxygen. I think that COPD plays a major role in his dyspnea.  - followed by Dr.  Everardo All  3. Chronic systolic CHF: Nonischemic cardiomyopathy based on 6/17 cath. However, he had a recent significant fall in EF from 30-35% to 10-15% on echo from 3/18.  LHC/RHC in 4/18 showed nonobstructive CAD and relatively optimized filling pressures.  He now has Medtronic CRT-D device with AV nodal ablation to allow BiV pacing.  Echo 6/19 with EF 25-30% with mildly decreased RV systolic function. CPX 7/19 was submaximal but likely only mild functional limitation from CHF.  Echo in 1/21 showed EF 35-40%, echo in 3/22 and again in 1/24 showed EF up to 40% with mildly decreased RV systolic function.   - NYHA class III, confounded by body habitus, COPD, and physical deconditioning. He is not volume overloaded by exam or Optivol.  - Continue torsemide 80 mg bid.  - Continue losartan 25 mg daily. Did not tolerate Entresto due to AKI and hypotension  - Continue eplerenone 50 mg daily and KCl to 20 daily.  - Continue Coreg 6.25 mg bid - Off Farxiga due to severe yeast infection.  - Echo today 11/04/23.  official read pending  - BMET/BNP today 4. CAD: Nonobstructive on 4/18 cath.  No exertional chest pain, rare atypical chest pain.  - No ASA with Eliquis use. - Statin myalgias, continue Repatha. Good LDL in 9/24.  5. Complete heart block: Now has Medtronic CRT-D device and s/p AV nodal ablation.  6. OSA: Unable to tolerate CPAP.    Follow-up with APP in 3 months.    Signed, Alen Bleacher, NP  11/04/2023  [facility] [address] [facility] [place] [address] [phone] (office) [phone] (fax)

## 2023-10-30 NOTE — Telephone Encounter (Signed)
 Unfortunately, this is the cheapest the insurance will cover is Wixela for $85. Pharmacy check for [facility] and brand name symbicort is $184. This is likely due to him not meeting his deductible yet since its the beginning of the year.  If he signs up for [email or web address] we may be able to get the inhaler for $55 with a coupon at [facility] but this would not apply towards his insurance deductible and would be an out of pocket cost.  If he wishes to discuss this further, please offer a virtual visit at 8:15 AM on any day I'm working in clinic.

## 2023-10-31 ENCOUNTER — Other Ambulatory Visit: Payer: Self-pay

## 2023-10-31 ENCOUNTER — Other Ambulatory Visit: Payer: Self-pay | Admitting: Internal Medicine

## 2023-10-31 ENCOUNTER — Encounter (HOSPITAL_BASED_OUTPATIENT_CLINIC_OR_DEPARTMENT_OTHER): Payer: Self-pay

## 2023-11-04 ENCOUNTER — Encounter (HOSPITAL_COMMUNITY): Payer: Self-pay

## 2023-11-04 ENCOUNTER — Ambulatory Visit (HOSPITAL_COMMUNITY)
Admission: RE | Admit: 2023-11-04 | Discharge: 2023-11-04 | Disposition: A | Discharge status: Home / Self Care | Payer: Medicare HMO | Source: Ambulatory Visit | Attending: Cardiology | Admitting: Cardiology

## 2023-11-04 ENCOUNTER — Ambulatory Visit (HOSPITAL_BASED_OUTPATIENT_CLINIC_OR_DEPARTMENT_OTHER)
Admission: RE | Admit: 2023-11-04 | Discharge: 2023-11-04 | Disposition: A | Discharge status: Home / Self Care | Payer: Medicare HMO | Source: Ambulatory Visit | Attending: Cardiology

## 2023-11-04 VITALS — BP 106/74 | HR 77 | Wt 285.0 lb

## 2023-11-04 DIAGNOSIS — I110 Hypertensive heart disease with heart failure: Secondary | ICD-10-CM | POA: Insufficient documentation

## 2023-11-04 DIAGNOSIS — I5022 Chronic systolic (congestive) heart failure: Secondary | ICD-10-CM | POA: Insufficient documentation

## 2023-11-04 DIAGNOSIS — J440 Chronic obstructive pulmonary disease with acute lower respiratory infection: Secondary | ICD-10-CM | POA: Insufficient documentation

## 2023-11-04 DIAGNOSIS — J1282 Pneumonia due to coronavirus disease 2019: Secondary | ICD-10-CM | POA: Insufficient documentation

## 2023-11-04 DIAGNOSIS — Z7901 Long term (current) use of anticoagulants: Secondary | ICD-10-CM | POA: Diagnosis not present

## 2023-11-04 DIAGNOSIS — E785 Hyperlipidemia, unspecified: Secondary | ICD-10-CM | POA: Insufficient documentation

## 2023-11-04 DIAGNOSIS — G4733 Obstructive sleep apnea (adult) (pediatric): Secondary | ICD-10-CM | POA: Diagnosis not present

## 2023-11-04 DIAGNOSIS — Z79899 Other long term (current) drug therapy: Secondary | ICD-10-CM | POA: Insufficient documentation

## 2023-11-04 DIAGNOSIS — Z87891 Personal history of nicotine dependence: Secondary | ICD-10-CM | POA: Insufficient documentation

## 2023-11-04 DIAGNOSIS — I4821 Permanent atrial fibrillation: Secondary | ICD-10-CM

## 2023-11-04 DIAGNOSIS — J449 Chronic obstructive pulmonary disease, unspecified: Secondary | ICD-10-CM | POA: Diagnosis not present

## 2023-11-04 DIAGNOSIS — I252 Old myocardial infarction: Secondary | ICD-10-CM | POA: Insufficient documentation

## 2023-11-04 DIAGNOSIS — I442 Atrioventricular block, complete: Secondary | ICD-10-CM | POA: Diagnosis not present

## 2023-11-04 DIAGNOSIS — E119 Type 2 diabetes mellitus without complications: Secondary | ICD-10-CM | POA: Insufficient documentation

## 2023-11-04 DIAGNOSIS — N179 Acute kidney failure, unspecified: Secondary | ICD-10-CM | POA: Insufficient documentation

## 2023-11-04 DIAGNOSIS — I2510 Atherosclerotic heart disease of native coronary artery without angina pectoris: Secondary | ICD-10-CM | POA: Insufficient documentation

## 2023-11-04 DIAGNOSIS — I428 Other cardiomyopathies: Secondary | ICD-10-CM | POA: Insufficient documentation

## 2023-11-04 DIAGNOSIS — I959 Hypotension, unspecified: Secondary | ICD-10-CM | POA: Insufficient documentation

## 2023-11-04 DIAGNOSIS — W19XXXA Unspecified fall, initial encounter: Secondary | ICD-10-CM | POA: Insufficient documentation

## 2023-11-04 DIAGNOSIS — I5020 Unspecified systolic (congestive) heart failure: Secondary | ICD-10-CM | POA: Diagnosis not present

## 2023-11-04 LAB — BASIC METABOLIC PANEL
Anion gap: 10 (ref 5–15)
BUN: 22 mg/dL (ref 8–23)
CO2: 26 mmol/L (ref 22–32)
Calcium: 9.4 mg/dL (ref 8.9–10.3)
Chloride: 100 mmol/L (ref 98–111)
Creatinine, Ser: 1.28 mg/dL — ABNORMAL HIGH (ref 0.61–1.24)
GFR, Estimated: >60 mL/min (ref >60)
Glucose, Bld: 88 mg/dL (ref 70–99)
Potassium: 5.1 mmol/L (ref 3.5–5.1)
Sodium: 136 mmol/L (ref 135–145)

## 2023-11-04 LAB — ECHOCARDIOGRAM COMPLETE
AR max vel: 1.97 cm2
AV Area VTI: 1.88 cm2
AV Area mean vel: 1.81 cm2
AV Mean grad: 8 mm[Hg]
AV Peak grad: 12 mm[Hg]
Ao pk vel: 1.73 m/s
Area-P 1/2: 3.5 cm2
MV VTI: 2.96 cm2
S' Lateral: 4.3 cm
Single Plane A4C EF: 47.4 %

## 2023-11-04 LAB — BRAIN NATRIURETIC PEPTIDE: B Natriuretic Peptide: 41.4 pg/mL (ref 0.0–100.0)

## 2023-11-04 MED ORDER — PERFLUTREN LIPID MICROSPHERE
1.0000 mL | INTRAVENOUS | Status: AC | PRN
  Administered 2023-11-04: 3 mL via INTRAVENOUS

## 2023-11-04 NOTE — Patient Instructions (Signed)
Medication Changes:  None  Lab Work: Labs today We will only contact you if something comes back abnormal or we need to make some changes. Otherwise no news is good news!    Testing/Procedures:  None  Referrals:  None  Special Instructions // Education:  none  Follow-Up in: 3 months with a Nurse Practitioner/Physicians Assistant  At the [facility], you and your health needs are our priority. We have a designated team specialized in the treatment of Heart Failure. This Care Team includes your primary Heart Failure Specialized Cardiologist (physician), Advanced Practice Providers (APPs- Physician Assistants and Nurse Practitioners), and Pharmacist who all work together to provide you with the care you need, when you need it.   You may see any of the following providers on your designated Care Team at your next follow up:  Dr. Arvilla Meres Dr. Marca Ancona Dr. Dorthula Nettles Dr. Theresia Bough Tonye Becket, NP Robbie Lis, [place] [facility] [place], [place] Brynda Peon, NP [place] Lee, NP Karle Plumber, PharmD   Please be sure to bring in all your medications bottles to every appointment.   Need to Contact us:  If you have any questions or concerns before your next appointment please send [place] a message through [place] or call our office at [phone].    TO LEAVE A MESSAGE FOR THE NURSE SELECT OPTION 2, PLEASE LEAVE A MESSAGE INCLUDING: YOUR NAME DATE OF BIRTH CALL BACK NUMBER REASON FOR CALL**this is important as we prioritize the call backs  YOU WILL RECEIVE A CALL BACK THE SAME DAY AS LONG AS YOU CALL BEFORE 4:00 PM

## 2023-11-05 ENCOUNTER — Encounter (HOSPITAL_COMMUNITY): Payer: Self-pay

## 2023-11-15 ENCOUNTER — Telehealth: Payer: Self-pay | Admitting: Internal Medicine

## 2023-11-15 NOTE — Telephone Encounter (Signed)
Patient dropped off handicap placard form - form placed in Dr. Raphael Gibney box up front.  Please call patient when completed - [phone]

## 2023-11-18 NOTE — Telephone Encounter (Signed)
 Placed on provider desk

## 2023-11-19 ENCOUNTER — Other Ambulatory Visit (HOSPITAL_BASED_OUTPATIENT_CLINIC_OR_DEPARTMENT_OTHER): Payer: Self-pay | Admitting: Pulmonary Disease

## 2023-11-21 ENCOUNTER — Other Ambulatory Visit: Payer: Self-pay | Admitting: Family Medicine

## 2023-11-21 DIAGNOSIS — M5412 Radiculopathy, cervical region: Secondary | ICD-10-CM | POA: Diagnosis not present

## 2023-11-21 DIAGNOSIS — Z6841 Body Mass Index (BMI) 40.0 and over, adult: Secondary | ICD-10-CM | POA: Diagnosis not present

## 2023-11-21 NOTE — Telephone Encounter (Signed)
 Called and left voicemail letting Pt know the form is ready for pickup and will be placed up front.

## 2023-11-27 NOTE — Progress Notes (Unsigned)
[name] [address] [place] [address] Phone: [phone] Subjective:    I'm seeing this patient by the request  of:  [name], MD  CC: Shoulder pain and multiple joint complaints  YQI:[id number]  10/01/2023 Chronic problem with exacerbation again today.  Secondary to patient's comorbidities injections seem to be the safer option for this individual.  Discussed icing regimen and home exercises, discussed which activities to do and which ones to avoid.  Increase activity slowly otherwise.  Patient is still following up with neurosurgery and awaiting MRI for the neck.  Follow-up with me again 8 to 10 weeks.  Social determinant of health includes patient is physically inactive secondary to his comorbidities.     Continues to have chronic difficulty.  I am concerned with patient's significant torticollis and likely nerve impingement from the neck contributing to some of the discomfort.  We do need an MRI of the neck on the 24th.  Had to do hospital with [facility] secondary to pacemaker.  Follow-up with me again to otherwise.  Follow-up 8 to 10 weeks     Updated 12/03/2023 [name] is a 68 y.o. male coming in with complaint of shoulder pain. Injections help last time, but pain is back.       Past Medical History:  Diagnosis Date   AICD (automatic cardioverter/defibrillator) present    Anemia    supposed to be taking Vit B but doesn't   ANXIETY    takes Xanax nightly   Arthritis    Asthma    Albuterol prn and Advair daily;also takes Prednisone daily   Cardiomyopathy (HCC)    a. EF 25% TEE July 2013; b. EF normalized 2015;  c. 03/2015 Echo: EF 40-45%, difrf HK, PASP 38 mmHg, Mild MR, sev LAE/RAE.   Chronic constipation    takes OTC stool softener   COPD (chronic obstructive pulmonary disease) (HCC)    "one dr says COPD; one dr says emphysema" (09/18/2017)   COVID-19 12/09/2019   DEPRESSION    takes Zoloft and Doxepin daily    Diverticulitis    DYSKINESIA, ESOPHAGUS    Essential hypertension        FIBROMYALGIA    GERD (gastroesophageal reflux disease)        Glaucoma    HYPERLIPIDEMIA    a. Intolerant to statins.   INSOMNIA    takes Ambien nightly   Myocardial infarction [facility])    a. 2012 Myoview notable for prior infarct;  b. 03/2015 Lexiscan CL: EF 37%, diff HK, small area of inferior infarct from apex to base-->Med Rx.   O2 dependent    "2.5L q hs & prn" (09/18/2017)   Paroxysmal atrial fibrillation (HCC)    a. CHA2DS2VASc = 3--> takes Coumadin;  b. 03/15/2015 Successful TEE/DCCV;  c. 03/2015 recurrent afib, Amio d/c'd in setting of hyperthyroidism.   Peripheral neuropathy    Pneumonia 12/2016   Rash and other nonspecific skin eruption 04/12/2009   no cause found saw dermatologists x 2 and allergist   SLEEP APNEA, OBSTRUCTIVE    a. doesn't use CPAP   Syncope    a. 03/2015 s/p MDT LINQ.   Type II diabetes mellitus (HCC)        Past Surgical History:  Procedure Laterality Date   ACNE CYST REMOVAL     2 on back    AV NODE ABLATION N/A 10/25/2017   Procedure: AV NODE ABLATION;  Surgeon: Duke Salvia, MD;  Location: [facility]  INVASIVE CV LAB;  Service: Cardiovascular;  Laterality: N/A;   BIV ICD INSERTION CRT-D N/A 09/18/2017   Procedure: BIV ICD INSERTION CRT-D;  Surgeon: Duke Salvia, MD;  Location: [facility] INVASIVE CV LAB;  Service: Cardiovascular;  Laterality: N/A;   CARDIAC CATHETERIZATION N/A 03/21/2016   Procedure: Right/Left Heart Cath and Coronary Angiography;  Surgeon: Laurey Morale, MD;  Location: [facility] INVASIVE CV LAB;  Service: Cardiovascular;  Laterality: N/A;   CARDIOVERSION  04/18/2012   Procedure: CARDIOVERSION;  Surgeon: Pricilla Riffle, MD;  Location: [facility] OR;  Service: Cardiovascular;  Laterality: N/A;   CARDIOVERSION  04/25/2012   Procedure: CARDIOVERSION;  Surgeon: Vesta Mixer, MD;  Location: [facility] ENDOSCOPY;  Service: Cardiovascular;  Laterality: N/A;   CARDIOVERSION  04/25/2012   Procedure:  CARDIOVERSION;  Surgeon: Pricilla Riffle, MD;  Location: [facility] OR;  Service: Cardiovascular;  Laterality: N/A;   CARDIOVERSION  05/09/2012   Procedure: CARDIOVERSION;  Surgeon: Tonny Bollman, MD;  Location: [facility] OR;  Service: Cardiovascular;  Laterality: N/A;  changed from crenshaw to cooper by trish/leone-endo   CARDIOVERSION N/A 03/15/2015   Procedure: CARDIOVERSION;  Surgeon: Vesta Mixer, MD;  Location: [facility] ENDOSCOPY;  Service: Cardiovascular;  Laterality: N/A;   COLONOSCOPY     COLONOSCOPY WITH PROPOFOL N/A 10/21/2014   Procedure: COLONOSCOPY WITH PROPOFOL;  Surgeon: Meryl Dare, MD;  Location: [facility] ENDOSCOPY;  Service: Endoscopy;  Laterality: N/A;   EP IMPLANTABLE DEVICE N/A 04/06/2015   Procedure: Loop Recorder Insertion;  Surgeon: Marinus Maw, MD;  Location: [facility] INVASIVE CV LAB;  Service: Cardiovascular;  Laterality: N/A;   ESOPHAGOGASTRODUODENOSCOPY     JOINT REPLACEMENT     LOOP RECORDER REMOVAL N/A 09/18/2017   Procedure: LOOP RECORDER REMOVAL;  Surgeon: Duke Salvia, MD;  Location: [facility] INVASIVE CV LAB;  Service: Cardiovascular;  Laterality: N/A;   RIGHT/LEFT HEART CATH AND CORONARY ANGIOGRAPHY N/A 01/28/2017   Procedure: Right/Left Heart Cath and Coronary Angiography;  Surgeon: Laurey Morale, MD;  Location: [facility] INVASIVE CV LAB;  Service: Cardiovascular;  Laterality: N/A;   TEE WITHOUT CARDIOVERSION  04/25/2012   Procedure: TRANSESOPHAGEAL ECHOCARDIOGRAM (TEE);  Surgeon: Vesta Mixer, MD;  Location: [facility] ENDOSCOPY;  Service: Cardiovascular;  Laterality: N/A;   TEE WITHOUT CARDIOVERSION N/A 03/15/2015   Procedure: TRANSESOPHAGEAL ECHOCARDIOGRAM (TEE);  Surgeon: Vesta Mixer, MD;  Location: [facility] ENDOSCOPY;  Service: Cardiovascular;  Laterality: N/A;   TONSILLECTOMY AND ADENOIDECTOMY     TOTAL KNEE ARTHROPLASTY Right 06/15/2014   Procedure: TOTAL KNEE ARTHROPLASTY;  Surgeon: Sheral Apley, MD;  Location: [facility] OR;  Service: Orthopedics;  Laterality: Right;   TOTAL KNEE ARTHROPLASTY Left  10/17/2021   Procedure: Left TOTAL KNEE ARTHROPLASTY;  Surgeon: Kathryne Hitch, MD;  Location: [facility] OR;  Service: Orthopedics;  Laterality: Left;   Social History   Socioeconomic History   Marital status: Significant Other    Spouse name: Not on file   Number of children: 2   Years of education: Not on file   Highest education level: Not on file  Occupational History   Occupation: retired/disabled. prev worked in Teacher, English as a foreign language.    Employer: DISABLED  Tobacco Use   Smoking status: Former    Current packs/day: 0.00    Average packs/day: 2.0 packs/day for 30.0 years (60.0 ttl pk-yrs)    Types: Cigarettes    Start date: 10/15/1977    Quit date: 10/16/2007    Years since quitting: 16.1   Smokeless tobacco: Never  Vaping Use   Vaping  status: Never Used  Substance and Sexual Activity   Alcohol use: No   Drug use: No   Sexual activity: Not Currently  Other Topics Concern   Not on file  Social History Narrative   Lives with girlfriend   Social Drivers of Health   Financial Resource Strain: Low Risk  (09/16/2023)   Overall Financial Resource Strain (CARDIA)    Difficulty of Paying Living Expenses: Not hard at all  Food Insecurity: No Food Insecurity (09/16/2023)   Hunger Vital Sign    Worried About Running Out of Food in the Last Year: Never true    Ran Out of Food in the Last Year: Never true  Transportation Needs: No Transportation Needs (09/16/2023)   PRAPARE - Administrator, Civil Service (Medical): No    Lack of Transportation (Non-Medical): No  Physical Activity: Inactive (09/16/2023)   Exercise Vital Sign    Days of Exercise per Week: 0 days    Minutes of Exercise per Session: 0 min  Stress: No Stress Concern Present (09/16/2023)   [employer] of Occupational Health - Occupational Stress Questionnaire    Feeling of Stress : Not at all  Social Connections: Moderately Isolated (09/16/2023)   Social Connection and Isolation Panel [NHANES]     Frequency of Communication with Friends and Family: Once a week    Frequency of Social Gatherings with Friends and Family: Never    Attends Religious Services: 1 to 4 times per year    Active Member of [employer] or Organizations: No    Attends Banker Meetings: Never    Marital Status: Living with partner   Allergies  Allergen Reactions   Tape Other (See Comments)    SKIN TEARS VERY, VERY EASILY!!!! Use Paper Tape Only, PLEASE!!!   Amiodarone Other (See Comments)    Caused hyperthyroidism    Statins Other (See Comments)    Myalgias    Sulfa Antibiotics Other (See Comments)    Mouth ulcers   Wound Dressing Adhesive Other (See Comments)    Skin Tears Use Paper Tape Only   Family History  Problem Relation Age of Onset   COPD Mother    Asthma Mother    Colon polyps Mother    Allergies Mother    Hypothyroidism Mother    Asthma Maternal Grandmother    Colon cancer Neg Hx     Current Outpatient Medications (Endocrine & Metabolic):    predniSONE (DELTASONE) 10 MG tablet, TAKE 1 TABLET (10 MG TOTAL) BY MOUTH DAILY WITH BREAKFAST.  Current Outpatient Medications (Cardiovascular):    carvedilol (COREG) 3.125 MG tablet, Take 1 tablet (3.125 mg total) by mouth 2 (two) times daily.   eplerenone (INSPRA) 25 MG tablet, Take 2 tablets (50 mg total) by mouth daily.   Evolocumab (REPATHA SURECLICK) 140 MG/ML SOAJ, Inject 140 mg into the skin every 14 (fourteen) days.   losartan (COZAAR) 25 MG tablet, Take 1 tablet (25 mg total) by mouth daily.   torsemide (DEMADEX) 20 MG tablet, Take 4 tablets (80 mg total) by mouth 2 (two) times daily.  Current Outpatient Medications (Respiratory):    albuterol (PROVENTIL) (2.5 MG/3ML) 0.083% nebulizer solution, Take 3 mLs (2.5 mg total) by nebulization every 6 (six) hours as needed for wheezing or shortness of breath.   albuterol (VENTOLIN HFA) 108 (90 Base) MCG/ACT inhaler, INHALE 1-2 PUFFS BY MOUTH EVERY 6 HOURS AS NEEDED FOR WHEEZE OR  SHORTNESS OF BREATH   fluticasone (FLONASE) 50 MCG/ACT nasal spray, Place  2 sprays into both nostrils daily as needed for allergies or rhinitis.   fluticasone-salmeterol (WIXELA INHUB) 250-50 MCG/ACT AEPB, Inhale 1 puff into the lungs in the morning and at bedtime.   ipratropium-albuterol (DUONEB) 0.5-2.5 (3) MG/3ML SOLN, TAKE 3 ML BY NEBULIZATION EVERY 4 HOURS AS NEEDED (WHEEZING).   Tiotropium Bromide Monohydrate (SPIRIVA RESPIMAT) 2.5 MCG/ACT AERS, Inhale 2 puffs into the lungs daily.  Current Outpatient Medications (Analgesics):    acetaminophen (TYLENOL) 500 MG tablet, Take 500-1,000 mg by mouth every 6 (six) hours as needed for mild pain or headache.   traMADol (ULTRAM) 50 MG tablet, Take 1 tablet (50 mg total) by mouth every 8 (eight) hours as needed.  Current Outpatient Medications (Hematological):    apixaban (ELIQUIS) 5 MG TABS tablet, Take 1 tablet (5 mg total) by mouth 2 (two) times daily.  Current Outpatient Medications (Other):    ALPRAZolam (XANAX) 0.5 MG tablet, TAKE 2 TABLETS (1 MG TOTAL) BY MOUTH EVERY DAY AT BEDTIME   ammonium lactate (AMLACTIN) 12 % cream, Apply 1 Application topically as needed for dry skin.   ascorbic acid (VITAMIN C) 500 MG tablet, Take 500 mg by mouth daily.   augmented betamethasone dipropionate (DIPROLENE-AF) 0.05 % cream, Apply 1 application  topically daily as needed (to affected areas- for itching).   carisoprodol (SOMA) 350 MG tablet, Take 1 tablet (350 mg total) by mouth 4 (four) times daily.   Cholecalciferol (VITAMIN D3) 1000 units CAPS, Take 1,000 Units by mouth daily.   clotrimazole-betamethasone (LOTRISONE) cream, Apply 1 Application topically daily.   doxepin (SINEQUAN) 10 MG capsule, TAKE 2 CAPSULES BY MOUTH AT BEDTIME AS NEEDED   DULoxetine (CYMBALTA) 60 MG capsule, TAKE 1 CAPSULE BY MOUTH EVERY DAY   Dupilumab (DUPIXENT) 300 MG/2ML SOPN, Inject 300 mg into the skin every 14 (fourteen) days.   fluconazole (DIFLUCAN) 100 MG tablet, Take 1  tablet (100 mg total) by mouth daily.   hydrOXYzine (ATARAX) 10 MG tablet, TAKE 1 TABLET BY MOUTH 3 TIMES DAILY AS NEEDED FOR ITCHING. Strength: 10 mg   ketoconazole (NIZORAL) 2 % cream, Apply 1 Application topically daily.   multivitamin (ONE-A-DAY MEN'S) TABS tablet, Take 1 tablet by mouth daily.   nystatin (MYCOSTATIN/NYSTOP) powder, Use as directed twice per day as needed to the affected area   omeprazole (PRILOSEC) 20 MG capsule, TAKE 1 CAPSULE BY MOUTH TWICE A DAY   potassium chloride SA (KLOR-CON M20) 20 MEQ tablet, Take 1 tablet (20 mEq total) by mouth daily. TAKE (2 TABLETS) IN THE MORNING (Patient taking differently: Take 20 mEq by mouth daily. TAKE 20 MEQ (1 TABLETS) IN THE MORNING)   Respiratory Therapy Supplies (FULL KIT NEBULIZER SET) MISC, 1 each by Does not apply route as needed.   silver sulfADIAZINE (SILVADENE) 1 % cream, APPLY TOPICALLY TO AFFECTED AREA EVERY DAY   tamsulosin (FLOMAX) 0.4 MG CAPS capsule, TAKE 1 CAPSULE BY MOUTH EVERY DAY   TUMS 500 MG chewable tablet, Chew 500-2,000 mg by mouth every 4 (four) hours as needed for indigestion or heartburn.   valACYclovir (VALTREX) 1000 MG tablet, Take 1 tablet (1,000 mg total) by mouth 3 (three) times daily.   venlafaxine XR (EFFEXOR-XR) 150 MG 24 hr capsule, TAKE 1 CAPSULE BY MOUTH DAILY WITH BREAKFAST.   zolpidem (AMBIEN) 10 MG tablet, Take 1 tablet (10 mg total) by mouth at bedtime as needed for sleep.   Reviewed prior external information including notes and imaging from  primary care provider As well as notes  that were available from care everywhere and other healthcare systems.  Past medical history, social, surgical and family history all reviewed in electronic medical record.  No pertanent information unless stated regarding to the chief complaint.   Review of Systems:  No headache, visual changes, nausea, vomiting, diarrhea, constipation, dizziness, abdominal pain, skin rash, fevers, chills, night sweats,  weight loss, swollen lymph nodes, body aches, joint swelling, chest pain, shortness of breath, mood changes. POSITIVE muscle aches  Objective  There were no vitals taken for this visit.   General: No apparent distress alert and oriented x3 mood and affect normal, dressed appropriately.  HEENT: Pupils equal, extraocular movements intact  Respiratory: Patient's speak in full sentences and does not appear short of breath  Cardiovascular: No lower extremity edema, non tender, no erythema  Significant torticollis noted.  Ambulating with the aid of a cane.  The patient does have of the shoulders bilateral atrophy of the shoulder girdle bilaterally.  Crepitus noted.  Procedure: Real-time Ultrasound Guided Injection of right glenohumeral joint Device: GE Logiq Q7  Ultrasound guided injection is preferred based studies that show increased duration, increased effect, greater accuracy, decreased procedural pain, increased response rate with ultrasound guided versus blind injection.  Verbal informed consent obtained.  Time-out conducted.  Noted no overlying erythema, induration, or other signs of local infection.  Skin prepped in a sterile fashion.  Local anesthesia: Topical Ethyl chloride.  With sterile technique and under real time ultrasound guidance:  Joint visualized.  23g 1  inch needle inserted posterior approach. Pictures taken for needle placement. Patient did have injection of 2 cc of 1% lidocaine, 2 cc of 0.5% Marcaine, and 1.0 cc of Kenalog 40 mg/dL. Completed without difficulty  Pain immediately resolved suggesting accurate placement of the medication.  Advised to call if fevers/chills, erythema, induration, drainage, or persistent bleeding.  Impression: Technically successful ultrasound guided injection.  Procedure: Real-time Ultrasound Guided Injection of left glenohumeral joint Device: GE Logiq E  Ultrasound guided injection is preferred based studies that show increased duration,  increased effect, greater accuracy, decreased procedural pain, increased response rate with ultrasound guided versus blind injection.  Verbal informed consent obtained.  Time-out conducted.  Noted no overlying erythema, induration, or other signs of local infection.  Skin prepped in a sterile fashion.  Local anesthesia: Topical Ethyl chloride.  With sterile technique and under real time ultrasound guidance:  Joint visualized.  21g 2 inch needle inserted posterior approach. Pictures taken for needle placement. Patient did have injection of 2 cc of 0.5% Marcaine, and 1cc of Kenalog 40 mg/dL. Completed without difficulty  Pain immediately resolved suggesting accurate placement of the medication.  Advised to call if fevers/chills, erythema, induration, drainage, or persistent bleeding.  Images permanently stored  Impression: Technically successful ultrasound guided injection.  Procedure: Real-time Ultrasound Guided Injection of right acromioclavicular joint Device: GE Logiq Q7 Ultrasound guided injection is preferred based studies that show increased duration, increased effect, greater accuracy, decreased procedural pain, increased response rate, and decreased cost with ultrasound guided versus blind injection.  Verbal informed consent obtained.  Time-out conducted.  Noted no overlying erythema, induration, or other signs of local infection.  Skin prepped in a sterile fashion.  Local anesthesia: Topical Ethyl chloride.  With sterile technique and under real time ultrasound guidance: With a 25-gauge half inch needle injected with 0.5 cc of 0.5% Marcaine and 0.5 cc of Kenalog 40 mg/mL Completed without difficulty  Pain immediately resolved suggesting accurate placement of the medication.  Advised to  call if fevers/chills, erythema, induration, drainage, or persistent bleeding.  Images saved Impression: Technically successful ultrasound guided injection.  Procedure: Real-time Ultrasound Guided  Injection of left acromioclavicular joint Device: GE Logiq Q7 Ultrasound guided injection is preferred based studies that show increased duration, increased effect, greater accuracy, decreased procedural pain, increased response rate, and decreased cost with ultrasound guided versus blind injection.  Verbal informed consent obtained.  Time-out conducted.  Noted no overlying erythema, induration, or other signs of local infection.  Skin prepped in a sterile fashion.  Local anesthesia: Topical Ethyl chloride.  With sterile technique and under real time ultrasound guidance: With a 25-gauge half inch needle injected with 0.5 cc of 0.5% Marcaine and 0.5 cc of Kenalog 40 mg/mL. Completed without difficulty  Pain immediately resolved suggesting accurate placement of the medication.  Advised to call if fevers/chills, erythema, induration, drainage, or persistent bleeding.  Images saved Impression: Technically successful ultrasound guided injection.    Impression and Recommendations:     The above documentation has been reviewed and is accurate and complete Judi Saa, DO

## 2023-11-29 ENCOUNTER — Telehealth (HOSPITAL_COMMUNITY): Payer: Self-pay

## 2023-11-29 NOTE — Telephone Encounter (Signed)
  [facility]   Pre-operative Risk Assessment   HEARTCARE STAFF-IMPORTANT INSTRUCTIONS 1 Red and Blue Text will auto delete once note is signed or closed. 2 Press F2 to navigate through template.   3 On drop down lists, L click to select >> R click to activate next field 4 Reason for Visit format is IMPORTANT!!  See Directions on No. 2 below. 5 Please review chart to determine if there is already a clearance note open for this procedure!!  DO NOT duplicate if a note already exists!!    :1}      Request for Surgical Clearance    Procedure:   Cervical Spine Interlaminar Left C6-7  Date of Surgery:  Clearance TBD                                 Surgeon:  Aileen Fass  Surgeon's Group or Practice Name:  [facility] Phone number:  [phone] Fax number:  [phone]   Type of Clearance Requested:   - Pharmacy:  Hold Apixaban (Eliquis) 3 days Prior   Type of Anesthesia:  None    Additional requests/questions:  Please advise surgeon/provider what medications should be held.  Signed, Linda Hedges   11/29/2023, 2:49 PM   [facility] [address] [facility] [place] [address] [phone] (office) [phone] (fax)

## 2023-11-29 NOTE — Telephone Encounter (Signed)
Stable symptoms and recent echo. Ok to hold Eliquis and have procedure.

## 2023-11-29 NOTE — Telephone Encounter (Signed)
Medical clearance form faxed to [facility] via Epic

## 2023-12-02 NOTE — Progress Notes (Signed)
 No ICM remote transmission received for 11/25/2023 and next ICM transmission scheduled for 12/09/2023.

## 2023-12-03 ENCOUNTER — Other Ambulatory Visit: Payer: Self-pay

## 2023-12-03 ENCOUNTER — Encounter: Payer: Self-pay | Admitting: Family Medicine

## 2023-12-03 ENCOUNTER — Ambulatory Visit: Payer: Medicare HMO | Admitting: Family Medicine

## 2023-12-03 VITALS — BP 120/76 | HR 74 | Ht 70.0 in

## 2023-12-03 DIAGNOSIS — G8929 Other chronic pain: Secondary | ICD-10-CM

## 2023-12-03 DIAGNOSIS — M25512 Pain in left shoulder: Secondary | ICD-10-CM | POA: Diagnosis not present

## 2023-12-03 DIAGNOSIS — M19012 Primary osteoarthritis, left shoulder: Secondary | ICD-10-CM | POA: Diagnosis not present

## 2023-12-03 DIAGNOSIS — M12811 Other specific arthropathies, not elsewhere classified, right shoulder: Secondary | ICD-10-CM

## 2023-12-03 DIAGNOSIS — M2550 Pain in unspecified joint: Secondary | ICD-10-CM | POA: Diagnosis not present

## 2023-12-03 DIAGNOSIS — M25511 Pain in right shoulder: Secondary | ICD-10-CM

## 2023-12-03 DIAGNOSIS — M19011 Primary osteoarthritis, right shoulder: Secondary | ICD-10-CM

## 2023-12-03 LAB — SYNOVIAL FLUID ANALYSIS, COMPLETE
Basophils, %: 0 %
Eosinophils-Synovial: 0 % (ref 0–2)
Lymphocytes-Synovial Fld: 50 % (ref 0–74)
Monocyte/Macrophage: 36 % (ref 0–69)
Neutrophil, Synovial: 8 % (ref 0–24)
Synoviocytes, %: 6 % (ref 0–15)
WBC, Synovial: 1172 {cells}/uL — ABNORMAL HIGH (ref <150)

## 2023-12-03 NOTE — Patient Instructions (Signed)
Good to see you! Injections in shoulders today See you again in 8-10 weeks

## 2023-12-03 NOTE — Assessment & Plan Note (Signed)
Bilateral injections given today.  Tolerated the procedure well, discussed icing regimen and home exercises, discussed avoiding certain activities.  Discussed icing regimen.  Will follow-up again in 6 to 8 weeks.  Would like to repeat the injections every 8 to 10 weeks if possible

## 2023-12-03 NOTE — Assessment & Plan Note (Signed)
Significant difficult situation.  Can repeat injections every 8 to 10 weeks.  Patient has been seen weeks ago.  We discussed with patient to continue to stay active otherwise.  Discussed icing regimen and home exercises.  Follow-up again in 6 to 8 weeks otherwise.

## 2023-12-04 ENCOUNTER — Encounter: Payer: Self-pay | Admitting: Family Medicine

## 2023-12-05 MED ORDER — DOXYCYCLINE HYCLATE 100 MG PO TABS: 100.0000 mg | ORAL_TABLET | Freq: Two times a day (BID) | ORAL | 0 refills | Status: AC

## 2023-12-06 NOTE — Addendum Note (Signed)
Addended by: Elease Etienne A on: 12/06/2023 08:41 AM   Modules accepted: Orders

## 2023-12-06 NOTE — Progress Notes (Signed)
 Remote ICD transmission.

## 2023-12-09 ENCOUNTER — Ambulatory Visit: Payer: Medicare HMO | Attending: Internal Medicine

## 2023-12-09 DIAGNOSIS — I5022 Chronic systolic (congestive) heart failure: Secondary | ICD-10-CM | POA: Diagnosis not present

## 2023-12-09 DIAGNOSIS — Z95810 Presence of automatic (implantable) cardiac defibrillator: Secondary | ICD-10-CM

## 2023-12-10 ENCOUNTER — Telehealth: Payer: Self-pay | Admitting: Podiatry

## 2023-12-10 NOTE — Telephone Encounter (Signed)
 Pt left message yesterday at 444pm stating he has seen Dr Jamse Arn last time and has seen Dr Carlota Raspberry before that. He stated he is having loss of feeling in feet causing dificulty standing.  I returned call and left message for pt to call to schedule an appt.

## 2023-12-11 ENCOUNTER — Encounter: Payer: Self-pay | Admitting: Internal Medicine

## 2023-12-11 ENCOUNTER — Telehealth: Payer: Self-pay

## 2023-12-11 ENCOUNTER — Ambulatory Visit (HOSPITAL_BASED_OUTPATIENT_CLINIC_OR_DEPARTMENT_OTHER): Payer: Medicare HMO | Admitting: Pulmonary Disease

## 2023-12-11 NOTE — Telephone Encounter (Signed)
 Remote ICM transmission received.  Attempted call to patient regarding ICM remote transmission and left detailed message per DPR.  Left ICM phone number and advised to return call for any fluid symptoms or questions. Next ICM remote transmission scheduled 01/13/2024.

## 2023-12-11 NOTE — Progress Notes (Signed)
 EPIC Encounter for ICM Monitoring  Patient Name: [name] is a 68 y.o. male Date: 12/11/2023 Primary Care Physican: [name], MD Primary Cardiologist: Shirlee Latch Electrophysiologist: Joycelyn Schmid Pacing: 97.8%    11/04/2023 Office Weight: 285 lbs     Attempted call to patient and unable to reach.  Left detailed message per DPR regarding transmission.  Transmission results reviewed.    Optivol thoracic impedance suggesting normal fluid levels with the exception of possible fluid accumulation from 1/21-1/30 and 2/6-2/16.   Prescribed:  Torsemide 20 mg 4 tablet(s) (80 mg total) by mouth twice day.     Potassium 20 mEq take 2 tablets (40 mEq total) daily   Labs: 11/04/2023 Creatinine 1.28, BUN 22, Potassium 5.1, Sodium 136, GFR >60 09/04/2023 Creatinine 1.09, BUN 26, Potassium 4.0, Sodium 140 07/01/2023 Creatinine 1.09, BUN 20, Potassium 5.7, Sodium 142, GFR 69.72  A complete set of results can be found in Results Review.   Recommendations:   Left voice mail with ICM number and encouraged to call if experiencing any fluid symptoms.   Follow-up plan: ICM clinic phone appointment on 01/13/2024.   91 day device clinic remote transmission 01/22/2024.   EP/Cardiology Office Visits:    12/13/2023 with Dr Graciela Husbands. 02/03/2024 with HF clinic.   Copy of ICM check sent to Dr. Graciela Husbands.   3 month ICM trend: 12/06/2023.    12-14 Month ICM trend:     Karie Soda, RN 12/11/2023 3:45 PM

## 2023-12-13 ENCOUNTER — Ambulatory Visit: Payer: Medicare HMO | Attending: Internal Medicine | Admitting: Internal Medicine

## 2023-12-13 ENCOUNTER — Encounter: Payer: Self-pay | Admitting: Internal Medicine

## 2023-12-13 ENCOUNTER — Other Ambulatory Visit: Payer: Self-pay | Admitting: Internal Medicine

## 2023-12-13 VITALS — BP 104/68 | HR 70 | Ht 70.0 in | Wt 278.0 lb

## 2023-12-13 DIAGNOSIS — I5020 Unspecified systolic (congestive) heart failure: Secondary | ICD-10-CM | POA: Diagnosis not present

## 2023-12-13 DIAGNOSIS — I4821 Permanent atrial fibrillation: Secondary | ICD-10-CM

## 2023-12-13 DIAGNOSIS — I4819 Other persistent atrial fibrillation: Secondary | ICD-10-CM

## 2023-12-13 DIAGNOSIS — I255 Ischemic cardiomyopathy: Secondary | ICD-10-CM

## 2023-12-13 DIAGNOSIS — Z95810 Presence of automatic (implantable) cardiac defibrillator: Secondary | ICD-10-CM

## 2023-12-13 DIAGNOSIS — I428 Other cardiomyopathies: Secondary | ICD-10-CM

## 2023-12-13 LAB — CUP PACEART INCLINIC DEVICE CHECK
Date Time Interrogation Session: 20250228170719
Implantable Lead Connection Status: 753985
Implantable Lead Connection Status: 753985
Implantable Lead Implant Date: 20181205
Implantable Lead Implant Date: 20181205
Implantable Lead Location: 753858
Implantable Lead Location: 753860
Implantable Lead Model: 4398
Implantable Pulse Generator Implant Date: 20181205

## 2023-12-13 NOTE — Patient Instructions (Signed)
 Medication Instructions:  Your physician recommends that you continue on your current medications as directed. Please refer to the Current Medication list given to you today.  *If you need a refill on your cardiac medications before your next appointment, please call your pharmacy*   Lab Work: None ordered.  If you have labs (blood work) drawn today and your tests are completely normal, you will receive your results only by: MyChart Message (if you have MyChart) OR A paper copy in the mail If you have any lab test that is abnormal or we need to change your treatment, we will call you to review the results.   Testing/Procedures: None ordered.    Follow-Up: At [facility], you and your health needs are our priority.  As part of our continuing mission to provide you with exceptional heart care, we have created designated Provider Care Teams.  These Care Teams include your primary Cardiologist (physician) and Advanced Practice Providers (APPs -  Physician Assistants and Nurse Practitioners) who all work together to provide you with the care you need, when you need it.  We recommend signing up for the patient portal called "MyChart".  Sign up information is provided on this After Visit Summary.  MyChart is used to connect with patients for Virtual Visits (Telemedicine).  Patients are able to view lab/test results, encounter notes, upcoming appointments, etc.  Non-urgent messages can be sent to your provider as well.   To learn more about what you can do with MyChart, go to [email or web address].    Your next appointment:   12 months with Dr Cherly Beach
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
## 2023-12-17 DIAGNOSIS — M5412 Radiculopathy, cervical region: Secondary | ICD-10-CM | POA: Diagnosis not present

## 2023-12-18 ENCOUNTER — Other Ambulatory Visit: Payer: Self-pay | Admitting: Family Medicine

## 2023-12-19 ENCOUNTER — Other Ambulatory Visit: Payer: Self-pay | Admitting: Internal Medicine

## 2023-12-21 ENCOUNTER — Encounter: Payer: Self-pay | Admitting: Internal Medicine

## 2023-12-24 ENCOUNTER — Telehealth: Payer: Self-pay

## 2023-12-24 MED ORDER — CARISOPRODOL 350 MG PO TABS: 350.0000 mg | ORAL_TABLET | Freq: Four times a day (QID) | ORAL | 2 refills | Status: DC

## 2023-12-24 NOTE — Telephone Encounter (Signed)
 Returned call to patient and advised [facility] device report and appears to be functioning properly. No alerts noted. He thinks laying on his stomach put pressure on the device area and made it sore.  No swelling or redness in the area.  Advised he can set up device clinic appt with the nurse and have it checked if it continues to bother him.  He was appreciative of the call back.

## 2023-12-24 NOTE — Telephone Encounter (Signed)
 Received call from patient.  He reports he had a scan of his neck done and had to lay on his stomach which as caused the area around the device to be very sore.  He would like to have device report reviewed to make sure it is working properly. Advised to send report for review.

## 2023-12-26 ENCOUNTER — Telehealth: Payer: Self-pay

## 2023-12-26 NOTE — Telephone Encounter (Signed)
 No because we normally dont send these to [facility] due to the large number of pills of controled substances involved  The [name] [facility] mo on [facility]  The xanax is also too soon and [facility]

## 2023-12-26 NOTE — Telephone Encounter (Signed)
 Copied from CRM [id number]. Topic: Clinical - Prescription Issue >> Dec 26, 2023  3:58 PM Kathryne Eriksson wrote: Reason for CRM: ALPRAZolam Prudy Feeler) 0.5 MG tablet , zolpidem (AMBIEN) 10 MG tablet  >> Dec 26, 2023  4:00 PM Kathryne Eriksson wrote: [facility] [phone]  Called in regards to patient, stays they have NOT received a response in regards to the medication order request for both medications.

## 2024-01-03 ENCOUNTER — Ambulatory Visit: Payer: Medicare HMO | Admitting: Internal Medicine

## 2024-01-03 ENCOUNTER — Encounter: Payer: Self-pay | Admitting: Internal Medicine

## 2024-01-03 VITALS — BP 130/82 | HR 75 | Temp 98.3°F | Ht 70.0 in | Wt 274.0 lb

## 2024-01-03 DIAGNOSIS — E1165 Type 2 diabetes mellitus with hyperglycemia: Secondary | ICD-10-CM

## 2024-01-03 DIAGNOSIS — R159 Full incontinence of feces: Secondary | ICD-10-CM

## 2024-01-03 DIAGNOSIS — R1310 Dysphagia, unspecified: Secondary | ICD-10-CM

## 2024-01-03 DIAGNOSIS — Z7985 Long-term (current) use of injectable non-insulin antidiabetic drugs: Secondary | ICD-10-CM

## 2024-01-03 DIAGNOSIS — E538 Deficiency of other specified B group vitamins: Secondary | ICD-10-CM

## 2024-01-03 DIAGNOSIS — Z0000 Encounter for general adult medical examination without abnormal findings: Secondary | ICD-10-CM

## 2024-01-03 DIAGNOSIS — E7800 Pure hypercholesterolemia, unspecified: Secondary | ICD-10-CM | POA: Diagnosis not present

## 2024-01-03 DIAGNOSIS — F419 Anxiety disorder, unspecified: Secondary | ICD-10-CM

## 2024-01-03 DIAGNOSIS — Z125 Encounter for screening for malignant neoplasm of prostate: Secondary | ICD-10-CM | POA: Diagnosis not present

## 2024-01-03 DIAGNOSIS — E559 Vitamin D deficiency, unspecified: Secondary | ICD-10-CM | POA: Diagnosis not present

## 2024-01-03 DIAGNOSIS — F32A Depression, unspecified: Secondary | ICD-10-CM

## 2024-01-03 DIAGNOSIS — Z0001 Encounter for general adult medical examination with abnormal findings: Secondary | ICD-10-CM

## 2024-01-03 MED ORDER — TIRZEPATIDE 2.5 MG/0.5ML ~~LOC~~ SOAJ: 2.5000 mg | SUBCUTANEOUS | 11 refills | Status: DC

## 2024-01-03 NOTE — Assessment & Plan Note (Signed)
Last vitamin D Lab Results  Component Value Date   VD25OH 22.51 (L) 07/01/2023   Low, to start oral replacement

## 2024-01-03 NOTE — Assessment & Plan Note (Signed)
Stable overall, cont current med tx 

## 2024-01-03 NOTE — Assessment & Plan Note (Signed)
 Also for GI referral, due for colonoscopy

## 2024-01-03 NOTE — Assessment & Plan Note (Signed)
 Lab Results  Component Value Date   LDLCALC 38 07/01/2023   Stable, pt to continue DM diet low cholesterol

## 2024-01-03 NOTE — Progress Notes (Signed)
 Patient ID: [name], male   DOB: 01-01-1956, 68 y.o.   MRN: [id number]         Chief Complaint:: wellness exam and c spine pain, dysphagia, bowel incontinence leakage, dm, hld       HPI:  [name] is a 68 y.o. male here for wellness exam; due or colonoscopy, o/w up to date                        Also Pt denies chest pain, increased sob or doe, wheezing, orthopnea, PND, increased LE swelling, palpitations, dizziness or syncope.   Pt denies polydipsia, polyuria, or new focal neuro s/s.    Pt denies fever, wt loss, night sweats, loss of appetite, or other constitutional symptoms  Has chronic neck pain after vertebral fx, s/p recent ESI and plans to return for next 2 shots.  Needs to lose wt, willing to start mounjaro 2.5  Denies worsening reflux, abd pain, n/v, or blood but has worsening dysphagia to solids and also bowel leakage ongoing, can't keep clean. Denies worsening depressive symptoms, suicidal ideation, or panic; has ongoing anxiety,   Wt Readings from Last 3 Encounters:  01/03/24 274 lb (124.3 kg)  12/13/23 278 lb (126.1 kg)  11/04/23 285 lb (129.3 kg)   BP Readings from Last 3 Encounters:  01/03/24 130/82  12/13/23 104/68  12/03/23 120/76   Immunization History  Administered Date(s) Administered   Fluad Quad(high Dose 65+) 06/29/2022   Fluad Trivalent(High Dose 65+) 07/01/2023   Influenza Inj Mdck Quad With Preservative 07/22/2019   Influenza Split 07/09/2011, 07/23/2012   Influenza Whole 09/16/2006, 08/26/2007, 07/27/2008, 07/26/2009, 07/07/2010   Influenza, High Dose Seasonal PF 06/22/2020, 07/07/2021   Influenza,inj,Quad PF,6+ Mos 06/30/2013, 08/19/2014, 06/27/2015, 07/13/2016, 07/09/2017, 07/18/2018   PFIZER(Purple Top)SARS-COV-2 Vaccination 03/21/2020, 04/11/2020, 11/03/2020   Pneumococcal Conjugate-13 04/29/2017   Pneumococcal Polysaccharide-23 03/29/2010, 07/07/2010, 02/23/2015, 03/17/2021   Td 10/16/2003   Tdap 02/23/2015   Health Maintenance Due  Topic  Date Due   Colonoscopy  03/06/2020   OPHTHALMOLOGY EXAM  01/23/2023   HEMOGLOBIN A1C  12/29/2023      Past Medical History:  Diagnosis Date   AICD (automatic cardioverter/defibrillator) present    Anemia    supposed to be taking Vit B but doesn't   ANXIETY    takes Xanax nightly   Arthritis    Asthma    Albuterol prn and Advair daily;also takes Prednisone daily   Cardiomyopathy (HCC)    a. EF 25% TEE July 2013; b. EF normalized 2015;  c. 03/2015 Echo: EF 40-45%, difrf HK, PASP 38 mmHg, Mild MR, sev LAE/RAE.   Chronic constipation    takes OTC stool softener   COPD (chronic obstructive pulmonary disease) (HCC)    "one dr says COPD; one dr says emphysema" (09/18/2017)   COVID-19 12/09/2019   DEPRESSION    takes Zoloft and Doxepin daily   Diverticulitis    DYSKINESIA, ESOPHAGUS    Essential hypertension        FIBROMYALGIA    GERD (gastroesophageal reflux disease)        Glaucoma    HYPERLIPIDEMIA    a. Intolerant to statins.   INSOMNIA    takes Ambien nightly   Myocardial infarction [facility])    a. 2012 Myoview notable for prior infarct;  b. 03/2015 Lexiscan CL: EF 37%, diff HK, small area of inferior infarct from apex to base-->Med Rx.   O2 dependent    "2.5L q hs &  prn" (09/18/2017)   Paroxysmal atrial fibrillation (HCC)    a. CHA2DS2VASc = 3--> takes Coumadin;  b. 03/15/2015 Successful TEE/DCCV;  c. 03/2015 recurrent afib, Amio d/c'd in setting of hyperthyroidism.   Peripheral neuropathy    Pneumonia 12/2016   Rash and other nonspecific skin eruption 04/12/2009   no cause found saw dermatologists x 2 and allergist   SLEEP APNEA, OBSTRUCTIVE    a. doesn't use CPAP   Syncope    a. 03/2015 s/p MDT LINQ.   Type II diabetes mellitus (HCC)        Past Surgical History:  Procedure Laterality Date   ACNE CYST REMOVAL     2 on back    AV NODE ABLATION N/A 10/25/2017   Procedure: AV NODE ABLATION;  Surgeon: Duke Salvia, MD;  Location: [facility] INVASIVE CV LAB;  Service:  Cardiovascular;  Laterality: N/A;   BIV ICD INSERTION CRT-D N/A 09/18/2017   Procedure: BIV ICD INSERTION CRT-D;  Surgeon: Duke Salvia, MD;  Location: [facility] INVASIVE CV LAB;  Service: Cardiovascular;  Laterality: N/A;   CARDIAC CATHETERIZATION N/A 03/21/2016   Procedure: Right/Left Heart Cath and Coronary Angiography;  Surgeon: Laurey Morale, MD;  Location: [facility] INVASIVE CV LAB;  Service: Cardiovascular;  Laterality: N/A;   CARDIOVERSION  04/18/2012   Procedure: CARDIOVERSION;  Surgeon: Pricilla Riffle, MD;  Location: [facility] OR;  Service: Cardiovascular;  Laterality: N/A;   CARDIOVERSION  04/25/2012   Procedure: CARDIOVERSION;  Surgeon: Vesta Mixer, MD;  Location: [facility] ENDOSCOPY;  Service: Cardiovascular;  Laterality: N/A;   CARDIOVERSION  04/25/2012   Procedure: CARDIOVERSION;  Surgeon: Pricilla Riffle, MD;  Location: [facility] OR;  Service: Cardiovascular;  Laterality: N/A;   CARDIOVERSION  05/09/2012   Procedure: CARDIOVERSION;  Surgeon: Tonny Bollman, MD;  Location: [facility] OR;  Service: Cardiovascular;  Laterality: N/A;  changed from crenshaw to cooper by trish/leone-endo   CARDIOVERSION N/A 03/15/2015   Procedure: CARDIOVERSION;  Surgeon: Vesta Mixer, MD;  Location: [facility] ENDOSCOPY;  Service: Cardiovascular;  Laterality: N/A;   COLONOSCOPY     COLONOSCOPY WITH PROPOFOL N/A 10/21/2014   Procedure: COLONOSCOPY WITH PROPOFOL;  Surgeon: Meryl Dare, MD;  Location: [facility] ENDOSCOPY;  Service: Endoscopy;  Laterality: N/A;   EP IMPLANTABLE DEVICE N/A 04/06/2015   Procedure: Loop Recorder Insertion;  Surgeon: Marinus Maw, MD;  Location: [facility] INVASIVE CV LAB;  Service: Cardiovascular;  Laterality: N/A;   ESOPHAGOGASTRODUODENOSCOPY     JOINT REPLACEMENT     LOOP RECORDER REMOVAL N/A 09/18/2017   Procedure: LOOP RECORDER REMOVAL;  Surgeon: Duke Salvia, MD;  Location: [facility] INVASIVE CV LAB;  Service: Cardiovascular;  Laterality: N/A;   RIGHT/LEFT HEART CATH AND CORONARY ANGIOGRAPHY N/A 01/28/2017   Procedure: Right/Left Heart  Cath and Coronary Angiography;  Surgeon: Laurey Morale, MD;  Location: [facility] INVASIVE CV LAB;  Service: Cardiovascular;  Laterality: N/A;   TEE WITHOUT CARDIOVERSION  04/25/2012   Procedure: TRANSESOPHAGEAL ECHOCARDIOGRAM (TEE);  Surgeon: Vesta Mixer, MD;  Location: [facility] ENDOSCOPY;  Service: Cardiovascular;  Laterality: N/A;   TEE WITHOUT CARDIOVERSION N/A 03/15/2015   Procedure: TRANSESOPHAGEAL ECHOCARDIOGRAM (TEE);  Surgeon: Vesta Mixer, MD;  Location: [facility] ENDOSCOPY;  Service: Cardiovascular;  Laterality: N/A;   TONSILLECTOMY AND ADENOIDECTOMY     TOTAL KNEE ARTHROPLASTY Right 06/15/2014   Procedure: TOTAL KNEE ARTHROPLASTY;  Surgeon: Sheral Apley, MD;  Location: [facility] OR;  Service: Orthopedics;  Laterality: Right;   TOTAL KNEE ARTHROPLASTY Left 10/17/2021   Procedure: Left TOTAL  KNEE ARTHROPLASTY;  Surgeon: Kathryne Hitch, MD;  Location: [facility] OR;  Service: Orthopedics;  Laterality: Left;    reports that he quit smoking about 16 years ago. His smoking use included cigarettes. He started smoking about 46 years ago. He has a 60 pack-year smoking history. He has never used smokeless tobacco. He reports that he does not drink alcohol and does not use drugs. family history includes Allergies in his mother; Asthma in his maternal grandmother and mother; COPD in his mother; Colon polyps in his mother; Hypothyroidism in his mother. Allergies  Allergen Reactions   Tape Other (See Comments)    SKIN TEARS VERY, VERY EASILY!!!! Use Paper Tape Only, PLEASE!!!   Amiodarone Other (See Comments)    Caused hyperthyroidism    Statins Other (See Comments)    Myalgias    Sulfa Antibiotics Other (See Comments)    Mouth ulcers   Wound Dressing Adhesive Other (See Comments)    Skin Tears Use Paper Tape Only   Current Outpatient Medications on File Prior to Visit  Medication Sig Dispense Refill   acetaminophen (TYLENOL) 500 MG tablet Take 500-1,000 mg by mouth every 6 (six) hours as needed for mild  pain or headache.     albuterol (PROVENTIL) (2.5 MG/3ML) 0.083% nebulizer solution Take 3 mLs (2.5 mg total) by nebulization every 6 (six) hours as needed for wheezing or shortness of breath. 360 mL 5   albuterol (VENTOLIN HFA) 108 (90 Base) MCG/ACT inhaler INHALE 1-2 PUFFS BY MOUTH EVERY 6 HOURS AS NEEDED FOR WHEEZE OR SHORTNESS OF BREATH 8.5 each 5   ALPRAZolam (XANAX) 0.5 MG tablet TAKE 2 TABLETS (1 MG TOTAL) BY MOUTH EVERY DAY AT BEDTIME 60 tablet 5   ammonium lactate (AMLACTIN) 12 % cream Apply 1 Application topically as needed for dry skin. 385 g 3   apixaban (ELIQUIS) 5 MG TABS tablet Take 1 tablet (5 mg total) by mouth 2 (two) times daily. 60 tablet 11   ascorbic acid (VITAMIN C) 500 MG tablet Take 500 mg by mouth daily.     augmented betamethasone dipropionate (DIPROLENE-AF) 0.05 % cream Apply 1 application  topically daily as needed (to affected areas- for itching).     carisoprodol (SOMA) 350 MG tablet Take 1 tablet (350 mg total) by mouth 4 (four) times daily. 120 tablet 2   Cholecalciferol (VITAMIN D3) 1000 units CAPS Take 1,000 Units by mouth daily.     clotrimazole-betamethasone (LOTRISONE) cream Apply 1 Application topically daily. 30 g 0   doxepin (SINEQUAN) 10 MG capsule TAKE 2 CAPSULES BY MOUTH AT BEDTIME AS NEEDED 180 capsule 1   DULoxetine (CYMBALTA) 60 MG capsule TAKE 1 CAPSULE BY MOUTH EVERY DAY 90 capsule 2   Dupilumab (DUPIXENT) 300 MG/2ML SOPN Inject 300 mg into the skin every 14 (fourteen) days.     Evolocumab (REPATHA SURECLICK) 140 MG/ML SOAJ Inject 140 mg into the skin every 14 (fourteen) days. 6 mL 3   fluconazole (DIFLUCAN) 100 MG tablet Take 1 tablet (100 mg total) by mouth daily. 7 tablet 0   fluticasone (FLONASE) 50 MCG/ACT nasal spray Place 2 sprays into both nostrils daily as needed for allergies or rhinitis. 48 mL 1   fluticasone-salmeterol (WIXELA INHUB) 250-50 MCG/ACT AEPB Inhale 1 puff into the lungs in the morning and at bedtime. 60 each 11   hydrOXYzine  (ATARAX) 10 MG tablet TAKE 1 TABLET BY MOUTH 3 TIMES DAILY AS NEEDED FOR ITCHING. Strength: 10 mg 60 tablet 2   ipratropium-albuterol (  DUONEB) 0.5-2.5 (3) MG/3ML SOLN TAKE 3 ML BY NEBULIZATION EVERY 4 HOURS AS NEEDED (WHEEZING). 360 mL 0   ketoconazole (NIZORAL) 2 % cream Apply 1 Application topically daily. 60 g 2   losartan (COZAAR) 25 MG tablet Take 1 tablet (25 mg total) by mouth daily. 90 tablet 3   nystatin (MYCOSTATIN/NYSTOP) powder Use as directed twice per day as needed to the affected area 60 g 2   omeprazole (PRILOSEC) 20 MG capsule TAKE 1 CAPSULE BY MOUTH TWICE A DAY 180 capsule 1   potassium chloride SA (KLOR-CON M20) 20 MEQ tablet Take 1 tablet (20 mEq total) by mouth daily. TAKE (2 TABLETS) IN THE MORNING (Patient taking differently: Take 20 mEq by mouth daily. TAKE 20 MEQ (1 TABLETS) IN THE MORNING) 60 tablet 3   predniSONE (DELTASONE) 10 MG tablet TAKE 1 TABLET (10 MG TOTAL) BY MOUTH DAILY WITH BREAKFAST. 30 tablet 0   Respiratory Therapy Supplies (FULL KIT NEBULIZER SET) MISC 1 each by Does not apply route as needed. 1 each 0   silver sulfADIAZINE (SILVADENE) 1 % cream APPLY TOPICALLY TO AFFECTED AREA EVERY DAY 50 g 3   tamsulosin (FLOMAX) 0.4 MG CAPS capsule TAKE 1 CAPSULE BY MOUTH EVERY DAY 90 capsule 3   Tiotropium Bromide Monohydrate (SPIRIVA RESPIMAT) 2.5 MCG/ACT AERS Inhale 2 puffs into the lungs daily. 4 g 11   tiZANidine (ZANAFLEX) 4 MG tablet Take 4 mg by mouth 3 (three) times daily.     torsemide (DEMADEX) 20 MG tablet Take 4 tablets (80 mg total) by mouth 2 (two) times daily. 240 tablet 2   traMADol (ULTRAM) 50 MG tablet TAKE 1 TABLET BY MOUTH EVERY 8 HOURS AS NEEDED. 90 tablet 2   TUMS 500 MG chewable tablet Chew 500-2,000 mg by mouth every 4 (four) hours as needed for indigestion or heartburn.     valACYclovir (VALTREX) 1000 MG tablet Take 1 tablet (1,000 mg total) by mouth 3 (three) times daily. 21 tablet 3   venlafaxine XR (EFFEXOR-XR) 150 MG 24 hr capsule  TAKE 1 CAPSULE BY MOUTH DAILY WITH BREAKFAST. 90 capsule 3   zolpidem (AMBIEN) 10 MG tablet Take 1 tablet (10 mg total) by mouth at bedtime as needed for sleep. 90 tablet 1   No current facility-administered medications on file prior to visit.        ROS:  All others reviewed and negative.  Objective        PE:  BP 130/82 (BP Location: Right Arm, Patient Position: Sitting, Cuff Size: Normal)   Pulse 75   Temp 98.3 F (36.8 C) (Oral)   Ht 5\' 10"  (1.778 m)   Wt 274 lb (124.3 kg)   SpO2 97%   BMI 39.31 kg/m                 Constitutional: Pt appears in NAD               HENT: Head: NCAT.                Right Ear: External ear normal.                 Left Ear: External ear normal.                Eyes: . Pupils are equal, round, and reactive to light. Conjunctivae and EOM are normal               Nose: without d/c or deformity  Neck: Neck supple. Gross normal ROM               Cardiovascular: Normal rate and regular rhythm.                 Pulmonary/Chest: Effort normal and breath sounds without rales or wheezing.                Abd:  Soft, NT, ND, + BS, no organomegaly               Neurological: Pt is alert. At baseline orientation, motor grossly intact               Skin: Skin is warm. No rashes, no other new lesions, LE edema - trace bilateral               Psychiatric: Pt behavior is normal without agitation   Micro: none  Cardiac tracings I have personally interpreted today:  none  Pertinent Radiological findings (summarize): none   Lab Results  Component Value Date   WBC 10.7 (H) 07/01/2023   HGB 12.9 (L) 07/01/2023   HCT 40.9 07/01/2023   PLT 271.0 07/01/2023   GLUCOSE 88 11/04/2023   CHOL 127 07/01/2023   TRIG 163.0 (H) 07/01/2023   HDL 55.70 07/01/2023   LDLDIRECT 72.0 02/28/2021   LDLCALC 38 07/01/2023   ALT 15 07/01/2023   AST 20 07/01/2023   NA 136 11/04/2023   K 5.1 11/04/2023   CL 100 11/04/2023   CREATININE 1.28 (H) 11/04/2023   BUN 22  11/04/2023   CO2 26 11/04/2023   TSH 4.79 07/01/2023   PSA 3.45 07/01/2023   INR 1.0 05/14/2023   HGBA1C 5.9 07/01/2023   MICROALBUR 1.5 07/01/2023   Assessment/Plan:  BOEN STERBENZ is a 68 y.o. White or Caucasian [1] male with  has a past medical history of AICD (automatic cardioverter/defibrillator) present, Anemia, ANXIETY, Arthritis, Asthma, Cardiomyopathy (HCC), Chronic constipation, COPD (chronic obstructive pulmonary disease) (HCC), COVID-19 (12/09/2019), DEPRESSION, Diverticulitis, DYSKINESIA, ESOPHAGUS, Essential hypertension, FIBROMYALGIA, GERD (gastroesophageal reflux disease), Glaucoma, HYPERLIPIDEMIA, INSOMNIA, Myocardial infarction (HCC), O2 dependent, Paroxysmal atrial fibrillation (HCC), Peripheral neuropathy, Pneumonia (12/2016), Rash and other nonspecific skin eruption (04/12/2009), SLEEP APNEA, OBSTRUCTIVE, Syncope, and Type II diabetes mellitus (HCC).  Encounter for well adult exam with abnormal findings Age and sex appropriate education and counseling updated with regular exercise and diet Referrals for preventative services - due for colonoscopy with GI referral Immunizations addressed - none needed Smoking counseling  - none needed Evidence for depression or other mood disorder - none significant Most recent labs reviewed. I have personally reviewed and have noted: 1) the patient's medical and social history 2) The patient's current medications and supplements 3) The patient's height, weight, and BMI have been recorded in the chart   Type 2 diabetes mellitus (HCC) Lab Results  Component Value Date   HGBA1C 5.9 07/01/2023   With uncontrolled obesity, pt to start mounjaro 2.5 mg weekly   Hyperlipidemia Lab Results  Component Value Date   LDLCALC 38 07/01/2023   Stable, pt to continue DM diet low cholesterol  Anxiety and depression Stable overall, cont current med tx  Vitamin D deficiency Last vitamin D Lab Results  Component Value Date   VD25OH  22.51 (L) 07/01/2023   Low, to start oral replacement   B12 deficiency Lab Results  Component Value Date   VITAMINB12 752 07/01/2023   Stable, cont oral replacement - b12 1000 mcg qd   Dysphagia For GI  referral, may need EGD  Incontinence of feces Also for GI referral, due for colonoscopy  Followup: No follow-ups on file.  [name], MD 01/03/2024 7:38 PM [facility]

## 2024-01-03 NOTE — Assessment & Plan Note (Signed)
Lab Results  Component Value Date   VITAMINB12 752 07/01/2023   Stable, cont oral replacement - b12 1000 mcg qd

## 2024-01-03 NOTE — Patient Instructions (Signed)
 Please take all new medication as prescribed - the mounjaro 2.5 mg weekly - sent to [facility]  Please call or mychart message in 1 month if you are taking this well, for the increase in the dose  Please continue all other medications as before, and refills have been done if requested.  Please have the pharmacy call with any other refills you may need.  Please continue your efforts at being more active, low cholesterol diet, and weight control.  You are otherwise up to date with prevention measures today.  Please keep your appointments with your specialists as you may have planned  You will be contacted regarding the referral for: GI  Please go to the LAB at the blood drawing area for the tests to be done  You will be contacted by phone if any changes need to be made immediately.  Otherwise, you will receive a letter about your results with an explanation, but please check with MyChart first.  Please make an Appointment to return in 6 months, or sooner if needed

## 2024-01-03 NOTE — Assessment & Plan Note (Signed)
 For GI referral, may need EGD

## 2024-01-03 NOTE — Assessment & Plan Note (Signed)
 Age and sex appropriate education and counseling updated with regular exercise and diet Referrals for preventative services - due for colonoscopy with GI referral Immunizations addressed - none needed Smoking counseling  - none needed Evidence for depression or other mood disorder - none significant Most recent labs reviewed. I have personally reviewed and have noted: 1) the patient's medical and social history 2) The patient's current medications and supplements 3) The patient's height, weight, and BMI have been recorded in the chart

## 2024-01-03 NOTE — Assessment & Plan Note (Signed)
 Lab Results  Component Value Date   HGBA1C 5.9 07/01/2023   With uncontrolled obesity, pt to start mounjaro 2.5 mg weekly

## 2024-01-07 ENCOUNTER — Other Ambulatory Visit (INDEPENDENT_AMBULATORY_CARE_PROVIDER_SITE_OTHER)

## 2024-01-07 DIAGNOSIS — Z125 Encounter for screening for malignant neoplasm of prostate: Secondary | ICD-10-CM | POA: Diagnosis not present

## 2024-01-07 DIAGNOSIS — E538 Deficiency of other specified B group vitamins: Secondary | ICD-10-CM | POA: Diagnosis not present

## 2024-01-07 DIAGNOSIS — M5412 Radiculopathy, cervical region: Secondary | ICD-10-CM | POA: Diagnosis not present

## 2024-01-07 DIAGNOSIS — E559 Vitamin D deficiency, unspecified: Secondary | ICD-10-CM | POA: Diagnosis not present

## 2024-01-07 DIAGNOSIS — E1165 Type 2 diabetes mellitus with hyperglycemia: Secondary | ICD-10-CM | POA: Diagnosis not present

## 2024-01-07 DIAGNOSIS — G243 Spasmodic torticollis: Secondary | ICD-10-CM | POA: Diagnosis not present

## 2024-01-07 LAB — HEPATIC FUNCTION PANEL
ALT: 31 U/L (ref 0–53)
AST: 23 U/L (ref 0–37)
Albumin: 3.7 g/dL (ref 3.5–5.2)
Alkaline Phosphatase: 50 U/L (ref 39–117)
Bilirubin, Direct: 0.1 mg/dL (ref 0.0–0.3)
Total Bilirubin: 0.3 mg/dL (ref 0.2–1.2)
Total Protein: 5.8 g/dL — ABNORMAL LOW (ref 6.0–8.3)

## 2024-01-07 LAB — LIPID PANEL
Cholesterol: 130 mg/dL (ref 0–200)
HDL: 52.4 mg/dL (ref >39.00)
LDL Cholesterol: 49 mg/dL (ref 0–99)
NonHDL: 77.12
Total CHOL/HDL Ratio: 2
Triglycerides: 139 mg/dL (ref 0.0–149.0)
VLDL: 27.8 mg/dL (ref 0.0–40.0)

## 2024-01-07 LAB — BASIC METABOLIC PANEL
BUN: 44 mg/dL — ABNORMAL HIGH (ref 6–23)
CO2: 29 meq/L (ref 19–32)
Calcium: 8.8 mg/dL (ref 8.4–10.5)
Chloride: 101 meq/L (ref 96–112)
Creatinine, Ser: 1.18 mg/dL (ref 0.40–1.50)
GFR: 63.16 mL/min (ref >60.00)
Glucose, Bld: 93 mg/dL (ref 70–99)
Potassium: 5 meq/L (ref 3.5–5.1)
Sodium: 136 meq/L (ref 135–145)

## 2024-01-07 LAB — CBC WITH DIFFERENTIAL/PLATELET
Basophils Absolute: 0 10*3/uL (ref 0.0–0.1)
Basophils Relative: 0.2 % (ref 0.0–3.0)
Eosinophils Absolute: 0 10*3/uL (ref 0.0–0.7)
Eosinophils Relative: 0.5 % (ref 0.0–5.0)
HCT: 36.1 % — ABNORMAL LOW (ref 39.0–52.0)
Hemoglobin: 11.6 g/dL — ABNORMAL LOW (ref 13.0–17.0)
Lymphocytes Relative: 9.5 % — ABNORMAL LOW (ref 12.0–46.0)
Lymphs Abs: 0.7 10*3/uL (ref 0.7–4.0)
MCHC: 32.2 g/dL (ref 30.0–36.0)
MCV: 96.4 fl (ref 78.0–100.0)
Monocytes Absolute: 0.6 10*3/uL (ref 0.1–1.0)
Monocytes Relative: 7.2 % (ref 3.0–12.0)
Neutro Abs: 6.4 10*3/uL (ref 1.4–7.7)
Neutrophils Relative %: 82.6 % — ABNORMAL HIGH (ref 43.0–77.0)
Platelets: 253 10*3/uL (ref 150.0–400.0)
RBC: 3.74 Mil/uL — ABNORMAL LOW (ref 4.22–5.81)
RDW: 14.9 % (ref 11.5–15.5)
WBC: 7.8 10*3/uL (ref 4.0–10.5)

## 2024-01-07 LAB — VITAMIN B12: Vitamin B-12: 570 pg/mL (ref 211–911)

## 2024-01-07 LAB — TSH: TSH: 2.02 u[IU]/mL (ref 0.35–5.50)

## 2024-01-07 LAB — PSA: PSA: 3.46 ng/mL (ref 0.10–4.00)

## 2024-01-07 LAB — VITAMIN D 25 HYDROXY (VIT D DEFICIENCY, FRACTURES): VITD: 29.38 ng/mL — ABNORMAL LOW (ref 30.00–100.00)

## 2024-01-07 LAB — MICROALBUMIN / CREATININE URINE RATIO
Creatinine,U: 87.3 mg/dL
Microalb Creat Ratio: UNDETERMINED mg/g (ref 0.0–30.0)
Microalb, Ur: <0.7 mg/dL

## 2024-01-07 LAB — HEMOGLOBIN A1C: Hgb A1c MFr Bld: 6 % (ref 4.6–6.5)

## 2024-01-08 ENCOUNTER — Encounter: Payer: Self-pay | Admitting: Internal Medicine

## 2024-01-08 LAB — URINALYSIS, ROUTINE W REFLEX MICROSCOPIC
Bilirubin Urine: NEGATIVE
Hgb urine dipstick: NEGATIVE
Ketones, ur: NEGATIVE
Leukocytes,Ua: NEGATIVE
Nitrite: NEGATIVE
RBC / HPF: NONE SEEN (ref 0–?)
Specific Gravity, Urine: 1.015 (ref 1.000–1.030)
Total Protein, Urine: NEGATIVE
Urine Glucose: NEGATIVE
Urobilinogen, UA: 0.2 (ref 0.0–1.0)
pH: 5.5 (ref 5.0–8.0)

## 2024-01-08 NOTE — Progress Notes (Signed)
 The test results show that your current treatment is OK, as the tests are stable.  Please continue the same plan.  There is no other need for change of treatment or further evaluation based on these results, at this time.  thanks

## 2024-01-09 ENCOUNTER — Ambulatory Visit: Payer: Medicare HMO | Admitting: Podiatry

## 2024-01-09 ENCOUNTER — Other Ambulatory Visit: Payer: Self-pay | Admitting: Internal Medicine

## 2024-01-09 MED ORDER — TRAMADOL HCL 50 MG PO TABS: 50.0000 mg | ORAL_TABLET | Freq: Four times a day (QID) | ORAL | Status: DC

## 2024-01-13 ENCOUNTER — Ambulatory Visit: Payer: Medicare HMO | Attending: Internal Medicine

## 2024-01-13 DIAGNOSIS — Z95810 Presence of automatic (implantable) cardiac defibrillator: Secondary | ICD-10-CM

## 2024-01-13 DIAGNOSIS — I5022 Chronic systolic (congestive) heart failure: Secondary | ICD-10-CM

## 2024-01-14 ENCOUNTER — Telehealth: Payer: Self-pay

## 2024-01-14 NOTE — Telephone Encounter (Signed)
Remote ICM transmission received.  Attempted call to patient regarding ICM remote transmission and left detailed message per DPR.  Left ICM phone number and advised to return call for any fluid symptoms or questions.

## 2024-01-14 NOTE — Progress Notes (Signed)
 EPIC Encounter for ICM Monitoring  Patient Name: [name] is a 68 y.o. male Date: 01/14/2024 Primary Care Physican: [name], MD Primary Cardiologist: Shirlee Latch Electrophysiologist: Joycelyn Schmid Pacing: 95.5%    11/04/2023 Office Weight: 285 lbs     Attempted call to patient and unable to reach.  Left detailed message per DPR regarding transmission.  Transmission results reviewed.    Optivol thoracic impedance suggesting possible fluid accumulation starting 3/20 and returned close to baseline 3/31.   Prescribed:  Torsemide 20 mg 4 tablet(s) (80 mg total) by mouth twice day.     Potassium 20 mEq take 2 tablets (40 mEq total) daily   Labs: 11/04/2023 Creatinine 1.28, BUN 22, Potassium 5.1, Sodium 136, GFR >60 09/04/2023 Creatinine 1.09, BUN 26, Potassium 4.0, Sodium 140 07/01/2023 Creatinine 1.09, BUN 20, Potassium 5.7, Sodium 142, GFR 69.72  A complete set of results can be found in Results Review.   Recommendations:   Left voice mail with ICM number and encouraged to call if experiencing any fluid symptoms.   Follow-up plan: ICM clinic phone appointment on 02/17/2024.   91 day device clinic remote transmission 01/22/2024.   EP/Cardiology Office Visits:    Recall 12/07/2024 with Dr Graciela Husbands. 02/03/2024 with HF clinic.   Copy of ICM check sent to Dr. Graciela Husbands.   3 month ICM trend: 01/13/2024.    12-14 Month ICM trend:     Karie Soda, RN 01/14/2024 12:34 PM

## 2024-01-16 ENCOUNTER — Ambulatory Visit (INDEPENDENT_AMBULATORY_CARE_PROVIDER_SITE_OTHER)

## 2024-01-16 ENCOUNTER — Ambulatory Visit: Admitting: Podiatry

## 2024-01-16 ENCOUNTER — Encounter: Payer: Self-pay | Admitting: Podiatry

## 2024-01-16 VITALS — Ht 70.0 in | Wt 274.0 lb

## 2024-01-16 DIAGNOSIS — E1140 Type 2 diabetes mellitus with diabetic neuropathy, unspecified: Secondary | ICD-10-CM

## 2024-01-16 DIAGNOSIS — L97511 Non-pressure chronic ulcer of other part of right foot limited to breakdown of skin: Secondary | ICD-10-CM

## 2024-01-16 DIAGNOSIS — M79671 Pain in right foot: Secondary | ICD-10-CM

## 2024-01-16 DIAGNOSIS — B351 Tinea unguium: Secondary | ICD-10-CM | POA: Diagnosis not present

## 2024-01-16 DIAGNOSIS — I739 Peripheral vascular disease, unspecified: Secondary | ICD-10-CM | POA: Diagnosis not present

## 2024-01-16 DIAGNOSIS — M79674 Pain in right toe(s): Secondary | ICD-10-CM | POA: Diagnosis not present

## 2024-01-16 DIAGNOSIS — M79675 Pain in left toe(s): Secondary | ICD-10-CM

## 2024-01-16 DIAGNOSIS — R234 Changes in skin texture: Secondary | ICD-10-CM | POA: Diagnosis not present

## 2024-01-16 MED ORDER — GABAPENTIN 100 MG PO CAPS: 100.0000 mg | ORAL_CAPSULE | Freq: Three times a day (TID) | ORAL | 3 refills | Status: DC

## 2024-01-16 MED ORDER — MUPIROCIN 2 % EX OINT: 1.0000 | TOPICAL_OINTMENT | Freq: Two times a day (BID) | CUTANEOUS | 0 refills | Status: DC

## 2024-01-16 NOTE — Progress Notes (Unsigned)
 [name] [address] [place] [address] Phone: [phone] Subjective:   [name], am serving as a scribe for Dr. Antoine Primas.  I'm seeing this patient by the request  of:  [name], MD  CC: bilateral shoulder   ONG:[id number]  12/03/2023 Bilateral injections given today.  Tolerated the procedure well, discussed icing regimen and home exercises, discussed avoiding certain activities.  Discussed icing regimen.  Will follow-up again in 6 to 8 weeks.  Would like to repeat the injections every 8 to 10 weeks if possible     Significant difficult situation.  Can repeat injections every 8 to 10 weeks.  Patient has been seen weeks ago.  We discussed with patient to continue to stay active otherwise.  Discussed icing regimen and home exercises.  Follow-up again in 6 to 8 weeks otherwise.     Updated 01/22/2024 [name] is a 68 y.o. male coming in with complaint of B shoulder pain. Patient states that he is the same as last visit. Does not feel like L shoulder is as swollen.        Past Medical History:  Diagnosis Date   AICD (automatic cardioverter/defibrillator) present    Anemia    supposed to be taking Vit B but doesn't   ANXIETY    takes Xanax nightly   Arthritis    Asthma    Albuterol prn and Advair daily;also takes Prednisone daily   Cardiomyopathy (HCC)    a. EF 25% TEE July 2013; b. EF normalized 2015;  c. 03/2015 Echo: EF 40-45%, difrf HK, PASP 38 mmHg, Mild MR, sev LAE/RAE.   Chronic constipation    takes OTC stool softener   COPD (chronic obstructive pulmonary disease) (HCC)    "one dr says COPD; one dr says emphysema" (09/18/2017)   COVID-19 12/09/2019   DEPRESSION    takes Zoloft and Doxepin daily   Diverticulitis    DYSKINESIA, ESOPHAGUS    Essential hypertension        FIBROMYALGIA    GERD (gastroesophageal reflux disease)        Glaucoma    HYPERLIPIDEMIA    a. Intolerant to statins.   INSOMNIA     takes Ambien nightly   Myocardial infarction [facility])    a. 2012 Myoview notable for prior infarct;  b. 03/2015 Lexiscan CL: EF 37%, diff HK, small area of inferior infarct from apex to base-->Med Rx.   O2 dependent    "2.5L q hs & prn" (09/18/2017)   Paroxysmal atrial fibrillation (HCC)    a. CHA2DS2VASc = 3--> takes Coumadin;  b. 03/15/2015 Successful TEE/DCCV;  c. 03/2015 recurrent afib, Amio d/c'd in setting of hyperthyroidism.   Peripheral neuropathy    Pneumonia 12/2016   Rash and other nonspecific skin eruption 04/12/2009   no cause found saw dermatologists x 2 and allergist   SLEEP APNEA, OBSTRUCTIVE    a. doesn't use CPAP   Syncope    a. 03/2015 s/p MDT LINQ.   Type II diabetes mellitus (HCC)        Past Surgical History:  Procedure Laterality Date   ACNE CYST REMOVAL     2 on back    AV NODE ABLATION N/A 10/25/2017   Procedure: AV NODE ABLATION;  Surgeon: Duke Salvia, MD;  Location: [facility] INVASIVE CV LAB;  Service: Cardiovascular;  Laterality: N/A;   BIV ICD INSERTION CRT-D N/A 09/18/2017   Procedure: BIV ICD INSERTION CRT-D;  Surgeon: Graciela Husbands,  Salvatore Decent, MD;  Location: [facility] INVASIVE CV LAB;  Service: Cardiovascular;  Laterality: N/A;   CARDIAC CATHETERIZATION N/A 03/21/2016   Procedure: Right/Left Heart Cath and Coronary Angiography;  Surgeon: Laurey Morale, MD;  Location: [place] [facility] INVASIVE CV LAB;  Service: Cardiovascular;  Laterality: N/A;   CARDIOVERSION  04/18/2012   Procedure: CARDIOVERSION;  Surgeon: Pricilla Riffle, MD;  Location: [facility] OR;  Service: Cardiovascular;  Laterality: N/A;   CARDIOVERSION  04/25/2012   Procedure: CARDIOVERSION;  Surgeon: Vesta Mixer, MD;  Location: [facility] ENDOSCOPY;  Service: Cardiovascular;  Laterality: N/A;   CARDIOVERSION  04/25/2012   Procedure: CARDIOVERSION;  Surgeon: Pricilla Riffle, MD;  Location: [facility] OR;  Service: Cardiovascular;  Laterality: N/A;   CARDIOVERSION  05/09/2012   Procedure: CARDIOVERSION;  Surgeon: Tonny Bollman, MD;  Location: [facility] OR;  Service:  Cardiovascular;  Laterality: N/A;  changed from crenshaw to cooper by trish/leone-endo   CARDIOVERSION N/A 03/15/2015   Procedure: CARDIOVERSION;  Surgeon: Vesta Mixer, MD;  Location: [facility] ENDOSCOPY;  Service: Cardiovascular;  Laterality: N/A;   COLONOSCOPY     COLONOSCOPY WITH PROPOFOL N/A 10/21/2014   Procedure: COLONOSCOPY WITH PROPOFOL;  Surgeon: Meryl Dare, MD;  Location: [facility] ENDOSCOPY;  Service: Endoscopy;  Laterality: N/A;   EP IMPLANTABLE DEVICE N/A 04/06/2015   Procedure: Loop Recorder Insertion;  Surgeon: Marinus Maw, MD;  Location: [facility] INVASIVE CV LAB;  Service: Cardiovascular;  Laterality: N/A;   ESOPHAGOGASTRODUODENOSCOPY     JOINT REPLACEMENT     LOOP RECORDER REMOVAL N/A 09/18/2017   Procedure: LOOP RECORDER REMOVAL;  Surgeon: Duke Salvia, MD;  Location: [facility] INVASIVE CV LAB;  Service: Cardiovascular;  Laterality: N/A;   RIGHT/LEFT HEART CATH AND CORONARY ANGIOGRAPHY N/A 01/28/2017   Procedure: Right/Left Heart Cath and Coronary Angiography;  Surgeon: Laurey Morale, MD;  Location: [facility] INVASIVE CV LAB;  Service: Cardiovascular;  Laterality: N/A;   TEE WITHOUT CARDIOVERSION  04/25/2012   Procedure: TRANSESOPHAGEAL ECHOCARDIOGRAM (TEE);  Surgeon: Vesta Mixer, MD;  Location: [facility] ENDOSCOPY;  Service: Cardiovascular;  Laterality: N/A;   TEE WITHOUT CARDIOVERSION N/A 03/15/2015   Procedure: TRANSESOPHAGEAL ECHOCARDIOGRAM (TEE);  Surgeon: Vesta Mixer, MD;  Location: [facility] ENDOSCOPY;  Service: Cardiovascular;  Laterality: N/A;   TONSILLECTOMY AND ADENOIDECTOMY     TOTAL KNEE ARTHROPLASTY Right 06/15/2014   Procedure: TOTAL KNEE ARTHROPLASTY;  Surgeon: Sheral Apley, MD;  Location: [facility] OR;  Service: Orthopedics;  Laterality: Right;   TOTAL KNEE ARTHROPLASTY Left 10/17/2021   Procedure: Left TOTAL KNEE ARTHROPLASTY;  Surgeon: Kathryne Hitch, MD;  Location: [facility] OR;  Service: Orthopedics;  Laterality: Left;   Social History   Socioeconomic History   Marital status:  Significant Other    Spouse name: Not on file   Number of children: 2   Years of education: Not on file   Highest education level: GED or equivalent  Occupational History   Occupation: retired/disabled. prev worked in Teacher, English as a foreign language.    Employer: DISABLED  Tobacco Use   Smoking status: Former    Current packs/day: 0.00    Average packs/day: 2.0 packs/day for 30.0 years (60.0 ttl pk-yrs)    Types: Cigarettes    Start date: 10/15/1977    Quit date: 10/16/2007    Years since quitting: 16.2   Smokeless tobacco: Never  Vaping Use   Vaping status: Never Used  Substance and Sexual Activity   Alcohol use: No   Drug use: No   Sexual activity: Not Currently  Other Topics  Concern   Not on file  Social History Narrative   Lives with girlfriend   Social Drivers of Health   Financial Resource Strain: Medium Risk (01/01/2024)   Overall Financial Resource Strain (CARDIA)    Difficulty of Paying Living Expenses: Somewhat hard  Food Insecurity: No Food Insecurity (01/01/2024)   Hunger Vital Sign    Worried About Running Out of Food in the Last Year: Never true    Ran Out of Food in the Last Year: Never true  Transportation Needs: No Transportation Needs (01/01/2024)   PRAPARE - Administrator, Civil Service (Medical): No    Lack of Transportation (Non-Medical): No  Physical Activity: Inactive (01/01/2024)   Exercise Vital Sign    Days of Exercise per Week: 0 days    Minutes of Exercise per Session: 0 min  Stress: No Stress Concern Present (01/01/2024)   [employer] of Occupational Health - Occupational Stress Questionnaire    Feeling of Stress : Only a little  Social Connections: Socially Isolated (01/01/2024)   Social Connection and Isolation Panel [NHANES]    Frequency of Communication with Friends and Family: Once a week    Frequency of Social Gatherings with Friends and Family: Never    Attends Religious Services: Never    Database administrator or Organizations: No     Attends Engineer, structural: Never    Marital Status: Divorced   Allergies  Allergen Reactions   Tape Other (See Comments)    SKIN TEARS VERY, VERY EASILY!!!! Use Paper Tape Only, PLEASE!!!   Amiodarone Other (See Comments)    Caused hyperthyroidism    Statins Other (See Comments)    Myalgias    Sulfa Antibiotics Other (See Comments)    Mouth ulcers   Wound Dressing Adhesive Other (See Comments)    Skin Tears Use Paper Tape Only   Family History  Problem Relation Age of Onset   COPD Mother    Asthma Mother    Colon polyps Mother    Allergies Mother    Hypothyroidism Mother    Asthma Maternal Grandmother    Colon cancer Neg Hx     Current Outpatient Medications (Endocrine & Metabolic):    predniSONE (DELTASONE) 10 MG tablet, TAKE 1 TABLET (10 MG TOTAL) BY MOUTH DAILY WITH BREAKFAST.   tirzepatide (MOUNJARO) 2.5 MG/0.5ML Pen, Inject 2.5 mg into the skin once a week.  Current Outpatient Medications (Cardiovascular):    carvedilol (COREG) 3.125 MG tablet, Take 3.125 mg by mouth 2 (two) times daily.   Evolocumab (REPATHA SURECLICK) 140 MG/ML SOAJ, Inject 140 mg into the skin every 14 (fourteen) days.   losartan (COZAAR) 25 MG tablet, Take 1 tablet (25 mg total) by mouth daily.   torsemide (DEMADEX) 20 MG tablet, TAKE 5 TABLETS (100 MG TOTAL) BY MOUTH EVERY MORNING AND 4 TABLETS (80 MG TOTAL) EVERY EVENING.  Current Outpatient Medications (Respiratory):    albuterol (PROVENTIL) (2.5 MG/3ML) 0.083% nebulizer solution, Take 3 mLs (2.5 mg total) by nebulization every 6 (six) hours as needed for wheezing or shortness of breath.   albuterol (VENTOLIN HFA) 108 (90 Base) MCG/ACT inhaler, INHALE 1-2 PUFFS BY MOUTH EVERY 6 HOURS AS NEEDED FOR WHEEZE OR SHORTNESS OF BREATH   fluticasone (FLONASE) 50 MCG/ACT nasal spray, Place 2 sprays into both nostrils daily as needed for allergies or rhinitis.   fluticasone-salmeterol (WIXELA INHUB) 250-50 MCG/ACT AEPB, Inhale 1 puff into  the lungs in the morning and at bedtime.  ipratropium-albuterol (DUONEB) 0.5-2.5 (3) MG/3ML SOLN, TAKE 3 ML BY NEBULIZATION EVERY 4 HOURS AS NEEDED (WHEEZING).   Tiotropium Bromide Monohydrate (SPIRIVA RESPIMAT) 2.5 MCG/ACT AERS, Inhale 2 puffs into the lungs daily.  Current Outpatient Medications (Analgesics):    acetaminophen (TYLENOL) 500 MG tablet, Take 500-1,000 mg by mouth every 6 (six) hours as needed for mild pain or headache.   traMADol (ULTRAM) 50 MG tablet, Take 1 tablet (50 mg total) by mouth 4 (four) times daily.  Current Outpatient Medications (Hematological):    apixaban (ELIQUIS) 5 MG TABS tablet, Take 1 tablet (5 mg total) by mouth 2 (two) times daily.  Current Outpatient Medications (Other):    ALPRAZolam (XANAX) 0.5 MG tablet, TAKE 2 TABLETS (1 MG TOTAL) BY MOUTH EVERY DAY AT BEDTIME   ammonium lactate (AMLACTIN) 12 % cream, Apply 1 Application topically as needed for dry skin.   ascorbic acid (VITAMIN C) 500 MG tablet, Take 500 mg by mouth daily.   augmented betamethasone dipropionate (DIPROLENE-AF) 0.05 % cream, Apply 1 application  topically daily as needed (to affected areas- for itching).   carisoprodol (SOMA) 350 MG tablet, Take 1 tablet (350 mg total) by mouth 4 (four) times daily.   Cholecalciferol (VITAMIN D3) 1000 units CAPS, Take 1,000 Units by mouth daily.   clotrimazole-betamethasone (LOTRISONE) cream, Apply 1 Application topically daily.   doxepin (SINEQUAN) 10 MG capsule, TAKE 2 CAPSULES BY MOUTH AT BEDTIME AS NEEDED   DULoxetine (CYMBALTA) 60 MG capsule, TAKE 1 CAPSULE BY MOUTH EVERY DAY   Dupilumab (DUPIXENT) 300 MG/2ML SOPN, Inject 300 mg into the skin every 14 (fourteen) days.   fluconazole (DIFLUCAN) 100 MG tablet, Take 1 tablet (100 mg total) by mouth daily.   gabapentin (NEURONTIN) 100 MG capsule, Take 1 capsule (100 mg total) by mouth 3 (three) times daily.   hydrOXYzine (ATARAX) 10 MG tablet, TAKE 1 TABLET BY MOUTH 3 TIMES DAILY AS NEEDED FOR  ITCHING. Strength: 10 mg   ketoconazole (NIZORAL) 2 % cream, Apply 1 Application topically daily.   mupirocin ointment (BACTROBAN) 2 %, Apply 1 Application topically 2 (two) times daily for 14 days. Apply to right 2nd toe and to back of left heel   nystatin (MYCOSTATIN/NYSTOP) powder, Use as directed twice per day as needed to the affected area   omeprazole (PRILOSEC) 20 MG capsule, TAKE 1 CAPSULE BY MOUTH TWICE A DAY   potassium chloride SA (KLOR-CON M20) 20 MEQ tablet, Take 1 tablet (20 mEq total) by mouth daily. TAKE (2 TABLETS) IN THE MORNING (Patient taking differently: Take 20 mEq by mouth daily. TAKE 20 MEQ (1 TABLETS) IN THE MORNING)   Respiratory Therapy Supplies (FULL KIT NEBULIZER SET) MISC, 1 each by Does not apply route as needed.   silver sulfADIAZINE (SILVADENE) 1 % cream, APPLY TOPICALLY TO AFFECTED AREA EVERY DAY   tamsulosin (FLOMAX) 0.4 MG CAPS capsule, TAKE 1 CAPSULE BY MOUTH EVERY DAY   tiZANidine (ZANAFLEX) 4 MG tablet, Take 4 mg by mouth 3 (three) times daily.   traZODone (DESYREL) 100 MG tablet, Take 200 mg by mouth at bedtime as needed.   TUMS 500 MG chewable tablet, Chew 500-2,000 mg by mouth every 4 (four) hours as needed for indigestion or heartburn.   valACYclovir (VALTREX) 1000 MG tablet, Take 1 tablet (1,000 mg total) by mouth 3 (three) times daily.   venlafaxine XR (EFFEXOR-XR) 150 MG 24 hr capsule, TAKE 1 CAPSULE BY MOUTH DAILY WITH BREAKFAST.   zolpidem (AMBIEN) 10 MG tablet, Take 1  tablet (10 mg total) by mouth at bedtime as needed for sleep.   Reviewed prior external information including notes and imaging from  primary care provider As well as notes that were available from care everywhere and other healthcare systems.  Past medical history, social, surgical and family history all reviewed in electronic medical record.  No pertanent information unless stated regarding to the chief complaint.   Review of Systems:  No headache, visual changes, nausea,  vomiting, diarrhea, constipation, dizziness, abdominal pain, skin rash, fevers, chills, night sweats, weight loss, swollen lymph nodes, body aches, joint swelling, chest pain, shortness of breath, mood changes. POSITIVE muscle aches  Objective  There were no vitals taken for this visit.   General: No apparent distress alert and oriented x3 mood and affect normal, dressed appropriately.  HEENT: Pupils equal, extraocular movements intact  Respiratory: Patient's speak in full sentences and does not appear short of breath  Cardiovascular: No lower extremity edema, non tender, no erythema  Shoulder exam shows   Procedure: Real-time Ultrasound Guided Injection of right glenohumeral joint Device: GE Logiq Q7  Ultrasound guided injection is preferred based studies that show increased duration, increased effect, greater accuracy, decreased procedural pain, increased response rate with ultrasound guided versus blind injection.  Verbal informed consent obtained.  Time-out conducted.  Noted no overlying erythema, induration, or other signs of local infection.  Skin prepped in a sterile fashion.  Local anesthesia: Topical Ethyl chloride.  With sterile technique and under real time ultrasound guidance:  Joint visualized.  23g 1  inch needle inserted posterior approach. Pictures taken for needle placement. Patient did have injection of 2 cc of 1% lidocaine, 2 cc of 0.5% Marcaine, and 1.0 cc of Kenalog 40 mg/dL. Completed without difficulty  Pain immediately resolved suggesting accurate placement of the medication.  Advised to call if fevers/chills, erythema, induration, drainage, or persistent bleeding.  Images saved Impression: Technically successful ultrasound guided injection.  Procedure: Real-time Ultrasound Guided Injection of left glenohumeral joint Device: GE Logiq E  Ultrasound guided injection is preferred based studies that show increased duration, increased effect, greater accuracy,  decreased procedural pain, increased response rate with ultrasound guided versus blind injection.  Verbal informed consent obtained.  Time-out conducted.  Noted no overlying erythema, induration, or other signs of local infection.  Skin prepped in a sterile fashion.  Local anesthesia: Topical Ethyl chloride.  With sterile technique and under real time ultrasound guidance:  Joint visualized.  21g 2 inch needle inserted posterior approach. Pictures taken for needle placement. Patient did have injection of 2 cc of 0.5% Marcaine, and 1cc of Kenalog 40 mg/dL. Completed without difficulty  Pain immediately resolved suggesting accurate placement of the medication.  Advised to call if fevers/chills, erythema, induration, drainage, or persistent bleeding.  Images permanently stored Impression: Technically successful ultrasound guided injection.   Impression and Recommendations:     The above documentation has been reviewed and is accurate and complete Judi Saa, DO

## 2024-01-16 NOTE — Progress Notes (Signed)
 Chief Complaint  Patient presents with   Foot Pain    ' I am constantly in pain with my feet, My last A1C level was 5.7, I have a sore on my second toe on right foot been wearing a bandaid to cover it"    HPI: 68 y.o. male presents today for diabetic footcare.  He reports worsening constant pain with his feet.  He comes in with new ulceration to right dorsal second toe, states that he has had it for couple weeks at this point.  He has been covering with a Band-Aid.  He also points out a painful fissure to back of left heel.  Last A1c 5.7.  Past Medical History:  Diagnosis Date   AICD (automatic cardioverter/defibrillator) present    Anemia    supposed to be taking Vit B but doesn't   ANXIETY    takes Xanax nightly   Arthritis    Asthma    Albuterol prn and Advair daily;also takes Prednisone daily   Cardiomyopathy (HCC)    a. EF 25% TEE July 2013; b. EF normalized 2015;  c. 03/2015 Echo: EF 40-45%, difrf HK, PASP 38 mmHg, Mild MR, sev LAE/RAE.   Chronic constipation    takes OTC stool softener   COPD (chronic obstructive pulmonary disease) (HCC)    "one dr says COPD; one dr says emphysema" (09/18/2017)   COVID-19 12/09/2019   DEPRESSION    takes Zoloft and Doxepin daily   Diverticulitis    DYSKINESIA, ESOPHAGUS    Essential hypertension        FIBROMYALGIA    GERD (gastroesophageal reflux disease)        Glaucoma    HYPERLIPIDEMIA    a. Intolerant to statins.   INSOMNIA    takes Ambien nightly   Myocardial infarction [facility])    a. 2012 Myoview notable for prior infarct;  b. 03/2015 Lexiscan CL: EF 37%, diff HK, small area of inferior infarct from apex to base-->Med Rx.   O2 dependent    "2.5L q hs & prn" (09/18/2017)   Paroxysmal atrial fibrillation (HCC)    a. CHA2DS2VASc = 3--> takes Coumadin;  b. 03/15/2015 Successful TEE/DCCV;  c. 03/2015 recurrent afib, Amio d/c'd in setting of hyperthyroidism.   Peripheral neuropathy    Pneumonia 12/2016   Rash and other  nonspecific skin eruption 04/12/2009   no cause found saw dermatologists x 2 and allergist   SLEEP APNEA, OBSTRUCTIVE    a. doesn't use CPAP   Syncope    a. 03/2015 s/p MDT LINQ.   Type II diabetes mellitus (HCC)         Past Surgical History:  Procedure Laterality Date   ACNE CYST REMOVAL     2 on back    AV NODE ABLATION N/A 10/25/2017   Procedure: AV NODE ABLATION;  Surgeon: Duke Salvia, MD;  Location: [facility] INVASIVE CV LAB;  Service: Cardiovascular;  Laterality: N/A;   BIV ICD INSERTION CRT-D N/A 09/18/2017   Procedure: BIV ICD INSERTION CRT-D;  Surgeon: Duke Salvia, MD;  Location: [facility] INVASIVE CV LAB;  Service: Cardiovascular;  Laterality: N/A;   CARDIAC CATHETERIZATION N/A 03/21/2016   Procedure: Right/Left Heart Cath and Coronary Angiography;  Surgeon: Laurey Morale, MD;  Location: [facility] INVASIVE CV LAB;  Service: Cardiovascular;  Laterality: N/A;   CARDIOVERSION  04/18/2012   Procedure: CARDIOVERSION;  Surgeon: Pricilla Riffle, MD;  Location: [facility] OR;  Service: Cardiovascular;  Laterality: N/A;  CARDIOVERSION  04/25/2012   Procedure: CARDIOVERSION;  Surgeon: Vesta Mixer, MD;  Location: [facility] ENDOSCOPY;  Service: Cardiovascular;  Laterality: N/A;   CARDIOVERSION  04/25/2012   Procedure: CARDIOVERSION;  Surgeon: Pricilla Riffle, MD;  Location: [facility] OR;  Service: Cardiovascular;  Laterality: N/A;   CARDIOVERSION  05/09/2012   Procedure: CARDIOVERSION;  Surgeon: Tonny Bollman, MD;  Location[facility] OR;  Service: Cardiovascular;  Laterality: N/A;  changed from crenshaw to cooper by trish/leone-endo   CARDIOVERSION N/A 03/15/2015   Procedure: CARDIOVERSION;  Surgeon: Vesta Mixer, MD;  Location: [facility] ENDOSCOPY;  Service: Cardiovascular;  Laterality: N/A;   COLONOSCOPY     COLONOSCOPY WITH PROPOFOL N/A 10/21/2014   Procedure: COLONOSCOPY WITH PROPOFOL;  Surgeon: Meryl Dare, MD;  Location: [facility] ENDOSCOPY;  Service: Endoscopy;  Laterality: N/A;   EP IMPLANTABLE DEVICE N/A 04/06/2015   Procedure: Loop  Recorder Insertion;  Surgeon: Marinus Maw, MD;  Location: [facility] INVASIVE CV LAB;  Service: Cardiovascular;  Laterality: N/A;   ESOPHAGOGASTRODUODENOSCOPY     JOINT REPLACEMENT     LOOP RECORDER REMOVAL N/A 09/18/2017   Procedure: LOOP RECORDER REMOVAL;  Surgeon: Duke Salvia, MD;  Location: [facility] INVASIVE CV LAB;  Service: Cardiovascular;  Laterality: N/A;   RIGHT/LEFT HEART CATH AND CORONARY ANGIOGRAPHY N/A 01/28/2017   Procedure: Right/Left Heart Cath and Coronary Angiography;  Surgeon: Laurey Morale, MD;  Location: [facility] INVASIVE CV LAB;  Service: Cardiovascular;  Laterality: N/A;   TEE WITHOUT CARDIOVERSION  04/25/2012   Procedure: TRANSESOPHAGEAL ECHOCARDIOGRAM (TEE);  Surgeon: Vesta Mixer, MD;  Location: [facility] ENDOSCOPY;  Service: Cardiovascular;  Laterality: N/A;   TEE WITHOUT CARDIOVERSION N/A 03/15/2015   Procedure: TRANSESOPHAGEAL ECHOCARDIOGRAM (TEE);  Surgeon: Vesta Mixer, MD;  Location: [facility] ENDOSCOPY;  Service: Cardiovascular;  Laterality: N/A;   TONSILLECTOMY AND ADENOIDECTOMY     TOTAL KNEE ARTHROPLASTY Right 06/15/2014   Procedure: TOTAL KNEE ARTHROPLASTY;  Surgeon: Sheral Apley, MD;  Location: [facility] OR;  Service: Orthopedics;  Laterality: Right;   TOTAL KNEE ARTHROPLASTY Left 10/17/2021   Procedure: Left TOTAL KNEE ARTHROPLASTY;  Surgeon: Kathryne Hitch, MD;  Location: [facility] OR;  Service: Orthopedics;  Laterality: Left;    Allergies  Allergen Reactions   Tape Other (See Comments)    SKIN TEARS VERY, VERY EASILY!!!! Use Paper Tape Only, PLEASE!!!   Amiodarone Other (See Comments)    Caused hyperthyroidism    Statins Other (See Comments)    Myalgias    Sulfa Antibiotics Other (See Comments)    Mouth ulcers   Wound Dressing Adhesive Other (See Comments)    Skin Tears Use Paper Tape Only    ROS   Physical Exam: There were no vitals filed for this visit.  General: The patient is alert and oriented x3 in no acute distress.  Dermatology: Nails x 10 are thickened,  elongated, dystrophic with subungual debris are painful on direct dorsal palpation.  Right second dorsal PIPJ ulceration limited to breakdown of skin with localized erythema, slightly macerated, no significant drainage.  Chronic skin changes to both legs consistent with venous stasis dermatitis.  Longitudinal fissure left posterior heel approximately 0.5 cm in length that is scabbed over.  Preulcerative callus present right first interphalangeal joint at head of proximal phalanx plantar medial aspect.  Vascular: Palpable pedal pulses DP pulses bilaterally, PT pulses diminished.. Capillary refill within normal limits.  Pedal hair growth absent.  Neurological: Light touch sensation grossly intact bilateral feet.  Subjective neuropathy symptoms, protective sensation decreased.  Musculoskeletal Exam: Bunion and hammertoe deformities bilaterally.  Radiographic Exam: Right foot/12/2023 Moderate bunion deformity noted with hammertoes to lesser digits.  Midfoot arthritis with pes planus noted.  No osteolysis or cortical erosions noted to the second PIPJ at site of dorsal ulceration.  Assessment/Plan of Care: 1. Ulcer of toe of right foot, limited to breakdown of skin (HCC)   2. Fissure of skin   3. Pain due to onychomycosis of toenails of both feet   4. Peripheral vascular disease (HCC)   5. Type 2 diabetes, controlled, with neuropathy (HCC)      Meds ordered this encounter  Medications   mupirocin ointment (BACTROBAN) 2 %    Sig: Apply 1 Application topically 2 (two) times daily for 14 days. Apply to right 2nd toe and to back of left heel    Dispense:  28 g    Refill:  0   gabapentin (NEURONTIN) 100 MG capsule    Sig: Take 1 capsule (100 mg total) by mouth 3 (three) times daily.    Dispense:  90 capsule    Refill:  3   US ARTERIAL LOWER EXTREMITY DUPLEX BILATERAL (NON-ABI)  Discussed clinical findings with patient today.  Arterial ultrasound ordered due to evidence of noncompressible  vessels on prior ABI.  Second toe ulceration dressed with mupirocin and Band-Aid.  Mupirocin ordered to patient's pharmacy.  Reviewed home dressing changes. Can also apply to the heel fissure as well.  All symptomatic hyperkeratoses x1 at level of right first toe plantar medial head of proximal phalanx were safely debrided with a sterile #15 blade to patient's level of comfort without incident. We discussed preventative and palliative care of these lesions including supportive and accommodative shoegear, padding, prefabricated and custom molded accommodative orthoses, use of a pumice stone and lotions/creams daily.  Painful mycotic toenails x 10 were debrided in thickness and length using sterile nail first without incident.  Mechanical bur was used to file the nail down.  Starting gabapentin 100 mg 3 times daily for neuropathic pain.  Did discuss with patient that his neck and back injuries are likely contributing to this as well.  Follow-up in 2 weeks to monitor ulceration. Discussed signs and symptoms of developing infection.    Latonga Ponder L. Marchia Bond, AACFAS [facility]     [address] [place], [place] [address]                Office [phone]  Fax [phone]

## 2024-01-16 NOTE — Patient Instructions (Signed)
 I have ordered arterial ultrasound noninvasive vascular studies. If you do not hear for them about scheduling within the next 1 week, or you have any questions please give [place] a call at [phone].

## 2024-01-17 ENCOUNTER — Other Ambulatory Visit: Payer: Self-pay | Admitting: Family Medicine

## 2024-01-20 ENCOUNTER — Telehealth: Payer: Self-pay | Admitting: Urology

## 2024-01-20 ENCOUNTER — Other Ambulatory Visit: Payer: Self-pay | Admitting: Cardiology

## 2024-01-20 NOTE — Telephone Encounter (Signed)
 This is a CHF pt

## 2024-01-20 NOTE — Telephone Encounter (Signed)
 FYI - Pt called wanting to reschedule his appt coming up on 4/17 to the end of June to July, pt stated that he will be seeing someone else for his wound care that he wants to come back and see [place] for the nails in June.

## 2024-01-21 ENCOUNTER — Ambulatory Visit (HOSPITAL_BASED_OUTPATIENT_CLINIC_OR_DEPARTMENT_OTHER): Payer: Medicare HMO | Admitting: Pulmonary Disease

## 2024-01-21 ENCOUNTER — Encounter (HOSPITAL_BASED_OUTPATIENT_CLINIC_OR_DEPARTMENT_OTHER): Payer: Self-pay | Admitting: Pulmonary Disease

## 2024-01-21 VITALS — BP 126/72 | HR 73 | Ht 70.0 in | Wt 269.6 lb

## 2024-01-21 DIAGNOSIS — J4489 Other specified chronic obstructive pulmonary disease: Secondary | ICD-10-CM | POA: Diagnosis not present

## 2024-01-21 MED ORDER — FLUTICASONE-SALMETEROL 250-50 MCG/ACT IN AEPB: 1.0000 | INHALATION_SPRAY | Freq: Two times a day (BID) | RESPIRATORY_TRACT | 11 refills | Status: DC

## 2024-01-21 NOTE — Progress Notes (Signed)
 Subjective:   PATIENT ID: [name] GENDER: male DOB: [birth year], MRN: [id number]  Chief Complaint  Patient presents with   Follow-up    COPD    Reason for Visit: Follow-up  Mr. Colbey Wirtanen is a 68 year old male former smoker with COPD, childhood asthma, OSA not on CPAP, DM2, HTN, atrial fib on eliquis, NICM s/p AICD, chronic diastolic heart failure who presents for follow-up.  He was recently treated for COPD exacerbation on 09/24/22. Treated with augmentin and doxycycline. On duonebs and albuterol. Referred to Pulmonary. Cough and wheezing has improved. He reports shortness of breath with walking a few steps. Denies chronic cough. Has baseline wheezing. Albuterol usually controls his symptoms. Currently on Dupixent which is managed by his dermatologist. Has been on it for two years and feels this helps his breathing. Before the pandemic [facility] [place] [facility].   12/31/22 Since our last visit he has had worsening shortness of breath associated with exertional wheezing. Uses nebulizer twice a day. Compliant with Trelegy daily. Congestion has worsened with the pollen. Reports improved cough and wheezing overall but still persistent. Denies recent fevers, chills. He was treated for RLE cellulitis last month but also treated for heart failure while inpatient.   02/13/23 Since our last visit he was treated for exacerbation and feeling better after steroids. Continues to have shortness of breath, cough and occasional wheezing. He is not on Trelegy and using Duonebs twice a day. He uses albuterol twice a day on average. He initially did not want to walk or be evaluated for oxygen needs due to concern for price and not wanting to wear oxygen. Unable to afford Dupixent and does not qualify for assistance per patient with Allergy.   06/13/23 Since our last visit he reports worsening shortness of breath. Stays at home and minimizes activity. Limited due to knee  issues and LLE wound. When he exerts himself he feels weak, shaky and nauseated. Baseline cough and occasional wheezing unchanged. Complains of right sided neck swelling for >1 week and feels like it is affecting his breathing. Denies fevers or chills. Denies problems swallowing. Does have a preference to lean head to the right side  09/10/23 Since our last visit he has unchanged shortness of breath but has had chronic neck pain for months that causes his neck to be positioned poorly while sleeping. Has cough or wheezing with activity. Compliant with wixela and spiriva. No respiratory infections since our last visit.   01/21/24 Since our last visit he reports his respiratory symptoms have improved since the colder weather as resolved. He is compliant with Wixela, Spiriva and Albuterol up to twice a day. Minimal wheezing. Limited activity with cane but able to mobilize in the home. Denies exacerbations since our last visit.  Social History: Quit in 2009. Former smoker 60 pack -years   Past Medical History:  Diagnosis Date   AICD (automatic cardioverter/defibrillator) present    Anemia    supposed to be taking Vit B but doesn't   ANXIETY    takes Xanax nightly   Arthritis    Asthma    Albuterol prn and Advair daily;also takes Prednisone daily   Cardiomyopathy (HCC)    a. EF 25% TEE July 2013; b. EF normalized 2015;  c. 03/2015 Echo: EF 40-45%, difrf HK, PASP 38 mmHg, Mild MR, sev LAE/RAE.   Chronic constipation    takes OTC stool softener   COPD (chronic obstructive pulmonary disease) (  [facility])    "one dr says COPD; one dr says emphysema" (09/18/2017)   COVID-19 12/09/2019   DEPRESSION    takes Zoloft and Doxepin daily   Diverticulitis    DYSKINESIA, ESOPHAGUS    Essential hypertension        FIBROMYALGIA    GERD (gastroesophageal reflux disease)        Glaucoma    HYPERLIPIDEMIA    a. Intolerant to statins.   INSOMNIA    takes Ambien nightly   Myocardial infarction [facility])    a. 2012  Myoview notable for prior infarct;  b. 03/2015 Lexiscan CL: EF 37%, diff HK, small area of inferior infarct from apex to base-->Med Rx.   O2 dependent    "2.5L q hs & prn" (09/18/2017)   Paroxysmal atrial fibrillation (HCC)    a. CHA2DS2VASc = 3--> takes Coumadin;  b. 03/15/2015 Successful TEE/DCCV;  c. 03/2015 recurrent afib, Amio d/c'd in setting of hyperthyroidism.   Peripheral neuropathy    Pneumonia 12/2016   Rash and other nonspecific skin eruption 04/12/2009   no cause found saw dermatologists x 2 and allergist   SLEEP APNEA, OBSTRUCTIVE    a. doesn't use CPAP   Syncope    a. 03/2015 s/p MDT LINQ.   Type II diabetes mellitus (HCC)          Family History  Problem Relation Age of Onset   COPD Mother    Asthma Mother    Colon polyps Mother    Allergies Mother    Hypothyroidism Mother    Asthma Maternal Grandmother    Colon cancer Neg Hx      Social History   Occupational History   Occupation: retired/disabled. prev worked in Teacher, English as a foreign language.    Employer: DISABLED  Tobacco Use   Smoking status: Former    Current packs/day: 0.00    Average packs/day: 2.0 packs/day for 30.0 years (60.0 ttl pk-yrs)    Types: Cigarettes    Start date: 10/15/1977    Quit date: 10/16/2007    Years since quitting: 16.2   Smokeless tobacco: Never  Vaping Use   Vaping status: Never Used  Substance and Sexual Activity   Alcohol use: No   Drug use: No   Sexual activity: Not Currently    Allergies  Allergen Reactions   Tape Other (See Comments)    SKIN TEARS VERY, VERY EASILY!!!! Use Paper Tape Only, PLEASE!!!   Amiodarone Other (See Comments)    Caused hyperthyroidism    Statins Other (See Comments)    Myalgias    Sulfa Antibiotics Other (See Comments)    Mouth ulcers   Wound Dressing Adhesive Other (See Comments)    Skin Tears Use Paper Tape Only     Outpatient Medications Prior to Visit  Medication Sig Dispense Refill   acetaminophen (TYLENOL) 500 MG tablet Take 500-1,000 mg by  mouth every 6 (six) hours as needed for mild pain or headache.     albuterol (PROVENTIL) (2.5 MG/3ML) 0.083% nebulizer solution Take 3 mLs (2.5 mg total) by nebulization every 6 (six) hours as needed for wheezing or shortness of breath. 360 mL 5   albuterol (VENTOLIN HFA) 108 (90 Base) MCG/ACT inhaler INHALE 1-2 PUFFS BY MOUTH EVERY 6 HOURS AS NEEDED FOR WHEEZE OR SHORTNESS OF BREATH 8.5 each 5   ALPRAZolam (XANAX) 0.5 MG tablet TAKE 2 TABLETS (1 MG TOTAL) BY MOUTH EVERY DAY AT BEDTIME 60 tablet 5   ammonium lactate (AMLACTIN) 12 % cream Apply 1 Application  topically as needed for dry skin. 385 g 3   apixaban (ELIQUIS) 5 MG TABS tablet Take 1 tablet (5 mg total) by mouth 2 (two) times daily. 60 tablet 11   ascorbic acid (VITAMIN C) 500 MG tablet Take 500 mg by mouth daily.     augmented betamethasone dipropionate (DIPROLENE-AF) 0.05 % cream Apply 1 application  topically daily as needed (to affected areas- for itching).     carisoprodol (SOMA) 350 MG tablet Take 1 tablet (350 mg total) by mouth 4 (four) times daily. 120 tablet 2   carvedilol (COREG) 3.125 MG tablet Take 3.125 mg by mouth 2 (two) times daily.     Cholecalciferol (VITAMIN D3) 1000 units CAPS Take 1,000 Units by mouth daily.     clotrimazole-betamethasone (LOTRISONE) cream Apply 1 Application topically daily. 30 g 0   doxepin (SINEQUAN) 10 MG capsule TAKE 2 CAPSULES BY MOUTH AT BEDTIME AS NEEDED 180 capsule 1   DULoxetine (CYMBALTA) 60 MG capsule TAKE 1 CAPSULE BY MOUTH EVERY DAY 90 capsule 2   Dupilumab (DUPIXENT) 300 MG/2ML SOPN Inject 300 mg into the skin every 14 (fourteen) days.     Evolocumab (REPATHA SURECLICK) 140 MG/ML SOAJ Inject 140 mg into the skin every 14 (fourteen) days. 6 mL 3   fluconazole (DIFLUCAN) 100 MG tablet Take 1 tablet (100 mg total) by mouth daily. 7 tablet 0   fluticasone (FLONASE) 50 MCG/ACT nasal spray Place 2 sprays into both nostrils daily as needed for allergies or rhinitis. 48 mL 1   gabapentin  (NEURONTIN) 100 MG capsule Take 1 capsule (100 mg total) by mouth 3 (three) times daily. 90 capsule 3   hydrOXYzine (ATARAX) 10 MG tablet TAKE 1 TABLET BY MOUTH 3 TIMES DAILY AS NEEDED FOR ITCHING. Strength: 10 mg 60 tablet 2   ipratropium-albuterol (DUONEB) 0.5-2.5 (3) MG/3ML SOLN TAKE 3 ML BY NEBULIZATION EVERY 4 HOURS AS NEEDED (WHEEZING). 360 mL 0   ketoconazole (NIZORAL) 2 % cream Apply 1 Application topically daily. 60 g 2   losartan (COZAAR) 25 MG tablet Take 1 tablet (25 mg total) by mouth daily. 90 tablet 3   mupirocin ointment (BACTROBAN) 2 % Apply 1 Application topically 2 (two) times daily for 14 days. Apply to right 2nd toe and to back of left heel 28 g 0   nystatin (MYCOSTATIN/NYSTOP) powder Use as directed twice per day as needed to the affected area 60 g 2   omeprazole (PRILOSEC) 20 MG capsule TAKE 1 CAPSULE BY MOUTH TWICE A DAY 180 capsule 1   potassium chloride SA (KLOR-CON M20) 20 MEQ tablet Take 1 tablet (20 mEq total) by mouth daily. TAKE (2 TABLETS) IN THE MORNING (Patient taking differently: Take 20 mEq by mouth daily. TAKE 20 MEQ (1 TABLETS) IN THE MORNING) 60 tablet 3   predniSONE (DELTASONE) 10 MG tablet TAKE 1 TABLET (10 MG TOTAL) BY MOUTH DAILY WITH BREAKFAST. 30 tablet 0   Respiratory Therapy Supplies (FULL KIT NEBULIZER SET) MISC 1 each by Does not apply route as needed. 1 each 0   silver sulfADIAZINE (SILVADENE) 1 % cream APPLY TOPICALLY TO AFFECTED AREA EVERY DAY 50 g 3   tamsulosin (FLOMAX) 0.4 MG CAPS capsule TAKE 1 CAPSULE BY MOUTH EVERY DAY 90 capsule 3   Tiotropium Bromide Monohydrate (SPIRIVA RESPIMAT) 2.5 MCG/ACT AERS Inhale 2 puffs into the lungs daily. 4 g 11   tirzepatide (MOUNJARO) 2.5 MG/0.5ML Pen Inject 2.5 mg into the skin once a week. 2 mL 11  tiZANidine (ZANAFLEX) 4 MG tablet Take 4 mg by mouth 3 (three) times daily.     torsemide (DEMADEX) 20 MG tablet TAKE 5 TABLETS (100 MG TOTAL) BY MOUTH EVERY MORNING AND 4 TABLETS (80 MG TOTAL) EVERY  EVENING. 810 tablet 2   traMADol (ULTRAM) 50 MG tablet Take 1 tablet (50 mg total) by mouth 4 (four) times daily.     traZODone (DESYREL) 100 MG tablet Take 200 mg by mouth at bedtime as needed.     TUMS 500 MG chewable tablet Chew 500-2,000 mg by mouth every 4 (four) hours as needed for indigestion or heartburn.     valACYclovir (VALTREX) 1000 MG tablet Take 1 tablet (1,000 mg total) by mouth 3 (three) times daily. 21 tablet 3   venlafaxine XR (EFFEXOR-XR) 150 MG 24 hr capsule TAKE 1 CAPSULE BY MOUTH DAILY WITH BREAKFAST. 90 capsule 3   zolpidem (AMBIEN) 10 MG tablet Take 1 tablet (10 mg total) by mouth at bedtime as needed for sleep. 90 tablet 1   fluticasone-salmeterol (WIXELA INHUB) 250-50 MCG/ACT AEPB Inhale 1 puff into the lungs in the morning and at bedtime. 60 each 11   No facility-administered medications prior to visit.    Review of Systems  Constitutional:  Negative for chills, diaphoresis, fever, malaise/fatigue and weight loss.  HENT:  Negative for congestion.   Respiratory:  Positive for shortness of breath and wheezing. Negative for cough, hemoptysis and sputum production.   Cardiovascular:  Negative for chest pain, palpitations and leg swelling.     Objective:   Vitals:   01/21/24 1401  BP: 126/72  Pulse: 73  SpO2: 98%  Weight: 269 lb 9.6 oz (122.3 kg)  Height: 5\' 10"  (1.778 m)   SpO2: 98 %  Body mass index is 38.68 kg/m.  Physical Exam: General: Well-appearing, no acute distress HENT: [place], AT Eyes: EOMI, no scleral icterus Respiratory: Diminished but clear to auscultation bilaterally.  No crackles, wheezing or rales Cardiovascular: RRR, -M/R/G, no JVD Extremities:-Edema,-tenderness Neuro: AAO x4, CNII-XII grossly intact Psych: Normal mood, normal affect   Data Reviewed:  Imaging: CXR 05/05/21 - Cardiomegaly. S/p biventricular ICD. Small bilateral pleural effusion CXR 11/29/22 - Cardiomegaly. S/p biventricular ICD. Resolved effusions CXR 01/25/23 -  Cardiomegaly. S/p biventricular ICD. Bibasilar atelectasis  PFT: 01/27/14 FVC 3.35 (72%) FEV1 2.41 (68%) Ratio 65  TLC 94% DLCO 77% Interpretation: Moderate obstructive defect with mildly reduced DLCO. Borderline bronchodilator response in FEV1 present.  Labs: CBC    Component Value Date/Time   WBC 7.8 01/07/2024 1546   RBC 3.74 (L) 01/07/2024 1546   HGB 11.6 (L) 01/07/2024 1546   HGB 12.8 (L) 12/19/2017 1532   HCT 36.1 (L) 01/07/2024 1546   HCT 38.0 12/19/2017 1532   PLT 253.0 01/07/2024 1546   PLT 208 12/19/2017 1532   MCV 96.4 01/07/2024 1546   MCV 99 (H) 12/19/2017 1532   MCH 32.3 05/15/2023 0149   MCHC 32.2 01/07/2024 1546   RDW 14.9 01/07/2024 1546   RDW 14.0 12/19/2017 1532   LYMPHSABS 0.7 01/07/2024 1546   LYMPHSABS 1.9 12/19/2017 1532   MONOABS 0.6 01/07/2024 1546   EOSABS 0.0 01/07/2024 1546   EOSABS 0.3 12/19/2017 1532   BASOSABS 0.0 01/07/2024 1546   BASOSABS 0.0 12/19/2017 1532   BMET    Component Value Date/Time   NA 136 01/07/2024 1546   NA 141 01/25/2023 1358   K 5.0 01/07/2024 1546   CL 101 01/07/2024 1546   CO2 29 01/07/2024 1546  GLUCOSE 93 01/07/2024 1546   BUN 44 (H) 01/07/2024 1546   BUN 41 (H) 01/25/2023 1358   CREATININE 1.18 01/07/2024 1546   CREATININE 0.92 03/19/2016 1420   CALCIUM 8.8 01/07/2024 1546   EGFR 56 (L) 01/25/2023 1358   GFRNONAA >60 11/04/2023 1528     Abs eos 12/20/21 -100  HST showed mild  OSA with AHI 11/ hr with low saturation of 66%     Assessment & Plan:   Discussion: 68 year old male former smoker with COPD, childhood asthma, OSA not on CPAP, DM2, HTN, atrial fib on eliquis, NICM s/p AICD, chronic diastolic heart failure who presents for follow-up. DOE is likely related to related to deconditioning, uncontrolled OSA/nocturnal hypoxemia and COPD. Discussed clinical course and management of COPD/asthma including optimizing bronchodilator regimen and action plan for exacerbation.    COPD-asthma overlap - not in  exacerbation. Persistent symptoms but improved --CONTINUE Wixela 250-50 mcg ONE puff in the morning and evening. Refilled --CONTINUE Spiriva 2.5 mcg TWO puffs in the morning --CONTINUE Albuterol AS NEEDED for shortness of breath or wheezing --Encouraged aerobic exercise 30 minutes five days a week  History of OSA Hypoxemia Last sleep test before 2013 Prior ambulatory O2 on last visit with no desaturations --06/25/23 home sleep study with mild OSA --06/20/23 overnight oximetry - Does not qualify for oxygen. Will reconsider retesting at next visit --Patient declined CPAP --Discussed weight loss   Health Maintenance Immunization History  Administered Date(s) Administered   Fluad Quad(high Dose 65+) 06/29/2022   Fluad Trivalent(High Dose 65+) 07/01/2023   Influenza Inj Mdck Quad With Preservative 07/22/2019   Influenza Split 07/09/2011, 07/23/2012   Influenza Whole 09/16/2006, 08/26/2007, 07/27/2008, 07/26/2009, 07/07/2010   Influenza, High Dose Seasonal PF 06/22/2020, 07/07/2021   Influenza,inj,Quad PF,6+ Mos 06/30/2013, 08/19/2014, 06/27/2015, 07/13/2016, 07/09/2017, 07/18/2018   PFIZER(Purple Top)SARS-COV-2 Vaccination 03/21/2020, 04/11/2020, 11/03/2020   Pneumococcal Conjugate-13 04/29/2017   Pneumococcal Polysaccharide-23 03/29/2010, 07/07/2010, 02/23/2015, 03/17/2021   Td 10/16/2003   Tdap 02/23/2015   CT Lung Screen - not qualified. Quit > 15 years ago  No orders of the defined types were placed in this encounter.  Meds ordered this encounter  Medications   fluticasone-salmeterol (WIXELA INHUB) 250-50 MCG/ACT AEPB    Sig: Inhale 1 puff into the lungs in the morning and at bedtime.    Dispense:  60 each    Refill:  11    Return in about 6 months (around 07/22/2024).  I have spent a total time of 30-minutes on the day of the appointment including chart review, data review, collecting history, coordinating care and discussing medical diagnosis and plan with the  patient/family. Past medical history, allergies, medications were reviewed. Pertinent imaging, labs and tests included in this note have been reviewed and interpreted independently by me.  Livy Ross Mechele Collin, MD [facility] 01/21/2024 2:24 PM

## 2024-01-21 NOTE — Patient Instructions (Signed)
 COPD-asthma overlap - not in exacerbation. Persistent symptoms but improved --CONTINUE Wixela 250-50 mcg ONE puff in the morning and evening. Refilled --CONTINUE Spiriva 2.5 mcg TWO puffs in the morning --CONTINUE Albuterol AS NEEDED for shortness of breath or wheezing --Encouraged aerobic exercise 30 minutes five days a week

## 2024-01-22 ENCOUNTER — Ambulatory Visit: Payer: Medicare HMO | Admitting: Family Medicine

## 2024-01-22 ENCOUNTER — Ambulatory Visit (INDEPENDENT_AMBULATORY_CARE_PROVIDER_SITE_OTHER): Payer: Medicare HMO

## 2024-01-22 ENCOUNTER — Other Ambulatory Visit: Payer: Self-pay

## 2024-01-22 VITALS — BP 112/68 | HR 74 | Ht 70.0 in | Wt 269.0 lb

## 2024-01-22 DIAGNOSIS — I442 Atrioventricular block, complete: Secondary | ICD-10-CM

## 2024-01-22 DIAGNOSIS — M25511 Pain in right shoulder: Secondary | ICD-10-CM | POA: Diagnosis not present

## 2024-01-22 DIAGNOSIS — M25512 Pain in left shoulder: Secondary | ICD-10-CM

## 2024-01-22 DIAGNOSIS — M12812 Other specific arthropathies, not elsewhere classified, left shoulder: Secondary | ICD-10-CM | POA: Diagnosis not present

## 2024-01-22 DIAGNOSIS — M12811 Other specific arthropathies, not elsewhere classified, right shoulder: Secondary | ICD-10-CM

## 2024-01-23 ENCOUNTER — Encounter: Payer: Self-pay | Admitting: Family Medicine

## 2024-01-23 NOTE — Assessment & Plan Note (Signed)
 Bilateral injections given again today.  Due to comorbidities there is not many other potential treatment options for this individual.  Social determinants of health including patient has some housing instability and physically inactive secondary to other orthopedic issues as well as patient's torticollis.  We will continue to monitor patient.  Can repeat injections every 8 to 10 weeks for palliative treatment of the shoulders.

## 2024-01-24 ENCOUNTER — Emergency Department (HOSPITAL_COMMUNITY)

## 2024-01-24 ENCOUNTER — Encounter (HOSPITAL_COMMUNITY): Payer: Self-pay

## 2024-01-24 ENCOUNTER — Inpatient Hospital Stay (HOSPITAL_COMMUNITY)

## 2024-01-24 ENCOUNTER — Other Ambulatory Visit: Payer: Self-pay

## 2024-01-24 ENCOUNTER — Inpatient Hospital Stay (HOSPITAL_COMMUNITY)
Admission: EM | Admit: 2024-01-24 | Discharge: 2024-01-27 | DRG: 871 | Disposition: A | Discharge status: Home Health | Attending: Pulmonary Disease | Admitting: Pulmonary Disease

## 2024-01-24 DIAGNOSIS — A4189 Other specified sepsis: Secondary | ICD-10-CM | POA: Diagnosis not present

## 2024-01-24 DIAGNOSIS — A419 Sepsis, unspecified organism: Secondary | ICD-10-CM | POA: Diagnosis not present

## 2024-01-24 DIAGNOSIS — E872 Acidosis, unspecified: Secondary | ICD-10-CM | POA: Diagnosis present

## 2024-01-24 DIAGNOSIS — I517 Cardiomegaly: Secondary | ICD-10-CM | POA: Diagnosis not present

## 2024-01-24 DIAGNOSIS — B9789 Other viral agents as the cause of diseases classified elsewhere: Secondary | ICD-10-CM | POA: Diagnosis present

## 2024-01-24 DIAGNOSIS — R571 Hypovolemic shock: Secondary | ICD-10-CM | POA: Diagnosis not present

## 2024-01-24 DIAGNOSIS — A084 Viral intestinal infection, unspecified: Secondary | ICD-10-CM | POA: Diagnosis present

## 2024-01-24 DIAGNOSIS — X58XXXA Exposure to other specified factors, initial encounter: Secondary | ICD-10-CM | POA: Diagnosis present

## 2024-01-24 DIAGNOSIS — I5022 Chronic systolic (congestive) heart failure: Secondary | ICD-10-CM | POA: Diagnosis present

## 2024-01-24 DIAGNOSIS — I480 Paroxysmal atrial fibrillation: Secondary | ICD-10-CM | POA: Diagnosis present

## 2024-01-24 DIAGNOSIS — R6521 Severe sepsis with septic shock: Secondary | ICD-10-CM | POA: Diagnosis present

## 2024-01-24 DIAGNOSIS — I110 Hypertensive heart disease with heart failure: Secondary | ICD-10-CM | POA: Diagnosis present

## 2024-01-24 DIAGNOSIS — N179 Acute kidney failure, unspecified: Secondary | ICD-10-CM | POA: Diagnosis present

## 2024-01-24 DIAGNOSIS — S81812A Laceration without foreign body, left lower leg, initial encounter: Secondary | ICD-10-CM | POA: Diagnosis present

## 2024-01-24 DIAGNOSIS — A09 Infectious gastroenteritis and colitis, unspecified: Secondary | ICD-10-CM | POA: Diagnosis not present

## 2024-01-24 DIAGNOSIS — G4733 Obstructive sleep apnea (adult) (pediatric): Secondary | ICD-10-CM | POA: Diagnosis present

## 2024-01-24 DIAGNOSIS — E669 Obesity, unspecified: Secondary | ICD-10-CM | POA: Diagnosis present

## 2024-01-24 DIAGNOSIS — Z9981 Dependence on supplemental oxygen: Secondary | ICD-10-CM

## 2024-01-24 DIAGNOSIS — J9611 Chronic respiratory failure with hypoxia: Secondary | ICD-10-CM | POA: Diagnosis present

## 2024-01-24 DIAGNOSIS — J4489 Other specified chronic obstructive pulmonary disease: Secondary | ICD-10-CM | POA: Diagnosis present

## 2024-01-24 DIAGNOSIS — I4891 Unspecified atrial fibrillation: Secondary | ICD-10-CM | POA: Diagnosis not present

## 2024-01-24 DIAGNOSIS — Z452 Encounter for adjustment and management of vascular access device: Secondary | ICD-10-CM | POA: Diagnosis not present

## 2024-01-24 DIAGNOSIS — I428 Other cardiomyopathies: Secondary | ICD-10-CM | POA: Diagnosis present

## 2024-01-24 DIAGNOSIS — J90 Pleural effusion, not elsewhere classified: Secondary | ICD-10-CM | POA: Diagnosis not present

## 2024-01-24 DIAGNOSIS — Z8616 Personal history of COVID-19: Secondary | ICD-10-CM

## 2024-01-24 DIAGNOSIS — E860 Dehydration: Secondary | ICD-10-CM | POA: Diagnosis present

## 2024-01-24 DIAGNOSIS — E119 Type 2 diabetes mellitus without complications: Secondary | ICD-10-CM | POA: Diagnosis present

## 2024-01-24 DIAGNOSIS — I429 Cardiomyopathy, unspecified: Secondary | ICD-10-CM | POA: Diagnosis not present

## 2024-01-24 DIAGNOSIS — R918 Other nonspecific abnormal finding of lung field: Secondary | ICD-10-CM | POA: Diagnosis not present

## 2024-01-24 DIAGNOSIS — J449 Chronic obstructive pulmonary disease, unspecified: Secondary | ICD-10-CM | POA: Diagnosis not present

## 2024-01-24 DIAGNOSIS — Z7952 Long term (current) use of systemic steroids: Secondary | ICD-10-CM

## 2024-01-24 DIAGNOSIS — R55 Syncope and collapse: Secondary | ICD-10-CM

## 2024-01-24 DIAGNOSIS — R197 Diarrhea, unspecified: Secondary | ICD-10-CM | POA: Diagnosis not present

## 2024-01-24 DIAGNOSIS — N200 Calculus of kidney: Secondary | ICD-10-CM | POA: Diagnosis not present

## 2024-01-24 DIAGNOSIS — Z95810 Presence of automatic (implantable) cardiac defibrillator: Secondary | ICD-10-CM | POA: Diagnosis not present

## 2024-01-24 DIAGNOSIS — E785 Hyperlipidemia, unspecified: Secondary | ICD-10-CM | POA: Diagnosis present

## 2024-01-24 DIAGNOSIS — Z7901 Long term (current) use of anticoagulants: Secondary | ICD-10-CM

## 2024-01-24 DIAGNOSIS — Z7985 Long-term (current) use of injectable non-insulin antidiabetic drugs: Secondary | ICD-10-CM

## 2024-01-24 DIAGNOSIS — R0989 Other specified symptoms and signs involving the circulatory and respiratory systems: Secondary | ICD-10-CM | POA: Diagnosis not present

## 2024-01-24 DIAGNOSIS — G8929 Other chronic pain: Secondary | ICD-10-CM | POA: Diagnosis present

## 2024-01-24 DIAGNOSIS — I5020 Unspecified systolic (congestive) heart failure: Secondary | ICD-10-CM | POA: Diagnosis not present

## 2024-01-24 DIAGNOSIS — D84821 Immunodeficiency due to drugs: Secondary | ICD-10-CM | POA: Diagnosis not present

## 2024-01-24 DIAGNOSIS — M542 Cervicalgia: Secondary | ICD-10-CM | POA: Diagnosis present

## 2024-01-24 DIAGNOSIS — Z79620 Long term (current) use of immunosuppressive biologic: Secondary | ICD-10-CM

## 2024-01-24 DIAGNOSIS — Z6839 Body mass index (BMI) 39.0-39.9, adult: Secondary | ICD-10-CM

## 2024-01-24 DIAGNOSIS — R0689 Other abnormalities of breathing: Secondary | ICD-10-CM | POA: Diagnosis not present

## 2024-01-24 DIAGNOSIS — E2740 Unspecified adrenocortical insufficiency: Secondary | ICD-10-CM | POA: Diagnosis not present

## 2024-01-24 DIAGNOSIS — I2510 Atherosclerotic heart disease of native coronary artery without angina pectoris: Secondary | ICD-10-CM | POA: Diagnosis present

## 2024-01-24 DIAGNOSIS — M797 Fibromyalgia: Secondary | ICD-10-CM | POA: Diagnosis present

## 2024-01-24 DIAGNOSIS — N281 Cyst of kidney, acquired: Secondary | ICD-10-CM | POA: Diagnosis not present

## 2024-01-24 DIAGNOSIS — Z87891 Personal history of nicotine dependence: Secondary | ICD-10-CM

## 2024-01-24 DIAGNOSIS — I6782 Cerebral ischemia: Secondary | ICD-10-CM | POA: Diagnosis not present

## 2024-01-24 DIAGNOSIS — R6520 Severe sepsis without septic shock: Secondary | ICD-10-CM | POA: Diagnosis not present

## 2024-01-24 DIAGNOSIS — R001 Bradycardia, unspecified: Secondary | ICD-10-CM | POA: Diagnosis not present

## 2024-01-24 DIAGNOSIS — K5730 Diverticulosis of large intestine without perforation or abscess without bleeding: Secondary | ICD-10-CM | POA: Diagnosis not present

## 2024-01-24 DIAGNOSIS — W19XXXA Unspecified fall, initial encounter: Secondary | ICD-10-CM | POA: Diagnosis not present

## 2024-01-24 LAB — CUP PACEART REMOTE DEVICE CHECK
Battery Remaining Longevity: 9 mo
Battery Voltage: 2.85 V
Brady Statistic AP VP Percent: 0 %
Brady Statistic AP VS Percent: 0 %
Brady Statistic AS VP Percent: 0 %
Brady Statistic AS VS Percent: 0 %
Brady Statistic RA Percent Paced: 0 %
Brady Statistic RV Percent Paced: 95.62 %
Date Time Interrogation Session: 20250410125049
HighPow Impedance: 91 Ohm
Implantable Lead Connection Status: 753985
Implantable Lead Connection Status: 753985
Implantable Lead Implant Date: 20181205
Implantable Lead Implant Date: 20181205
Implantable Lead Location: 753858
Implantable Lead Location: 753860
Implantable Lead Model: 4398
Implantable Pulse Generator Implant Date: 20181205
Lead Channel Impedance Value: 1121 Ohm
Lead Channel Impedance Value: 1140 Ohm
Lead Channel Impedance Value: 1178 Ohm
Lead Channel Impedance Value: 1197 Ohm
Lead Channel Impedance Value: 261.164
Lead Channel Impedance Value: 306.269
Lead Channel Impedance Value: 309.309
Lead Channel Impedance Value: 312.941
Lead Channel Impedance Value: 316.116
Lead Channel Impedance Value: 342 Ohm
Lead Channel Impedance Value: 399 Ohm
Lead Channel Impedance Value: 4047 Ohm
Lead Channel Impedance Value: 513 Ohm
Lead Channel Impedance Value: 532 Ohm
Lead Channel Impedance Value: 760 Ohm
Lead Channel Impedance Value: 779 Ohm
Lead Channel Impedance Value: 836 Ohm
Lead Channel Impedance Value: 988 Ohm
Lead Channel Pacing Threshold Amplitude: 0.375 V
Lead Channel Pacing Threshold Pulse Width: 0.4 ms
Lead Channel Sensing Intrinsic Amplitude: 3.5 mV
Lead Channel Sensing Intrinsic Amplitude: 4 mV
Lead Channel Setting Pacing Amplitude: 2.5 V
Lead Channel Setting Pacing Amplitude: 2.5 V
Lead Channel Setting Pacing Pulse Width: 0.4 ms
Lead Channel Setting Pacing Pulse Width: 1 ms
Lead Channel Setting Sensing Sensitivity: 0.3 mV
Zone Setting Status: 755011

## 2024-01-24 LAB — CBC WITH DIFFERENTIAL/PLATELET
Abs Immature Granulocytes: 0.36 10*3/uL — ABNORMAL HIGH (ref 0.00–0.07)
Basophils Absolute: 0 10*3/uL (ref 0.0–0.1)
Basophils Relative: 0 %
Eosinophils Absolute: 0 10*3/uL (ref 0.0–0.5)
Eosinophils Relative: 0 %
HCT: 44.5 % (ref 39.0–52.0)
Hemoglobin: 13.9 g/dL (ref 13.0–17.0)
Immature Granulocytes: 2 %
Lymphocytes Relative: 6 %
Lymphs Abs: 1.3 10*3/uL (ref 0.7–4.0)
MCH: 30 pg (ref 26.0–34.0)
MCHC: 31.2 g/dL (ref 30.0–36.0)
MCV: 96.1 fL (ref 80.0–100.0)
Monocytes Absolute: 1.7 10*3/uL — ABNORMAL HIGH (ref 0.1–1.0)
Monocytes Relative: 8 %
Neutro Abs: 17.3 10*3/uL — ABNORMAL HIGH (ref 1.7–7.7)
Neutrophils Relative %: 84 %
Platelets: 371 10*3/uL (ref 150–400)
RBC: 4.63 MIL/uL (ref 4.22–5.81)
RDW: 14.2 % (ref 11.5–15.5)
WBC: 20.6 10*3/uL — ABNORMAL HIGH (ref 4.0–10.5)
nRBC: 0 % (ref 0.0–0.2)

## 2024-01-24 LAB — BASIC METABOLIC PANEL WITH GFR
Anion gap: 16 — ABNORMAL HIGH (ref 5–15)
BUN: 58 mg/dL — ABNORMAL HIGH (ref 8–23)
CO2: 19 mmol/L — ABNORMAL LOW (ref 22–32)
Calcium: 9.3 mg/dL (ref 8.9–10.3)
Chloride: 99 mmol/L (ref 98–111)
Creatinine, Ser: 2.48 mg/dL — ABNORMAL HIGH (ref 0.61–1.24)
GFR, Estimated: 27 mL/min — ABNORMAL LOW (ref >60)
Glucose, Bld: 83 mg/dL (ref 70–99)
Potassium: 4.4 mmol/L (ref 3.5–5.1)
Sodium: 134 mmol/L — ABNORMAL LOW (ref 135–145)

## 2024-01-24 LAB — COMPREHENSIVE METABOLIC PANEL WITH GFR
ALT: 57 U/L — ABNORMAL HIGH (ref 0–44)
AST: 29 U/L (ref 15–41)
Albumin: 3.2 g/dL — ABNORMAL LOW (ref 3.5–5.0)
Alkaline Phosphatase: 122 U/L (ref 38–126)
Anion gap: 14 (ref 5–15)
BUN: 55 mg/dL — ABNORMAL HIGH (ref 8–23)
CO2: 18 mmol/L — ABNORMAL LOW (ref 22–32)
Calcium: 8.5 mg/dL — ABNORMAL LOW (ref 8.9–10.3)
Chloride: 99 mmol/L (ref 98–111)
Creatinine, Ser: 2.28 mg/dL — ABNORMAL HIGH (ref 0.61–1.24)
GFR, Estimated: 30 mL/min — ABNORMAL LOW (ref >60)
Glucose, Bld: 143 mg/dL — ABNORMAL HIGH (ref 70–99)
Potassium: 3.8 mmol/L (ref 3.5–5.1)
Sodium: 131 mmol/L — ABNORMAL LOW (ref 135–145)
Total Bilirubin: 0.8 mg/dL (ref 0.0–1.2)
Total Protein: 5.7 g/dL — ABNORMAL LOW (ref 6.5–8.1)

## 2024-01-24 LAB — PROTIME-INR
INR: 1.2 (ref 0.8–1.2)
Prothrombin Time: 15.1 s (ref 11.4–15.2)

## 2024-01-24 LAB — GLUCOSE, CAPILLARY: Glucose-Capillary: 112 mg/dL — ABNORMAL HIGH (ref 70–99)

## 2024-01-24 LAB — BRAIN NATRIURETIC PEPTIDE: B Natriuretic Peptide: 33.7 pg/mL (ref 0.0–100.0)

## 2024-01-24 LAB — I-STAT CG4 LACTIC ACID, ED: Lactic Acid, Venous: 3.5 mmol/L (ref 0.5–1.9)

## 2024-01-24 LAB — HIV ANTIBODY (ROUTINE TESTING W REFLEX): HIV Screen 4th Generation wRfx: NONREACTIVE

## 2024-01-24 LAB — LIPASE, BLOOD: Lipase: 30 U/L (ref 11–51)

## 2024-01-24 LAB — MRSA NEXT GEN BY PCR, NASAL: MRSA by PCR Next Gen: NOT DETECTED

## 2024-01-24 LAB — LACTIC ACID, PLASMA: Lactic Acid, Venous: 1.7 mmol/L (ref 0.5–1.9)

## 2024-01-24 LAB — CBG MONITORING, ED: Glucose-Capillary: 105 mg/dL — ABNORMAL HIGH (ref 70–99)

## 2024-01-24 MED ORDER — VANCOMYCIN HCL IN DEXTROSE 1-5 GM/200ML-% IV SOLN: 1000.0000 mg | Freq: Once | INTRAVENOUS | Status: DC

## 2024-01-24 MED ORDER — METRONIDAZOLE 500 MG/100ML IV SOLN
500.0000 mg | Freq: Once | INTRAVENOUS | Status: AC
  Administered 2024-01-24: 500 mg via INTRAVENOUS
  Filled 2024-01-24: qty 100

## 2024-01-24 MED ORDER — ACETAMINOPHEN 10 MG/ML IV SOLN
1000.0000 mg | Freq: Once | INTRAVENOUS | Status: AC
  Administered 2024-01-24: 1000 mg via INTRAVENOUS
  Filled 2024-01-24: qty 100

## 2024-01-24 MED ORDER — VENLAFAXINE HCL ER 75 MG PO CP24
150.0000 mg | ORAL_CAPSULE | Freq: Every day | ORAL | Status: DC
  Administered 2024-01-25 – 2024-01-27 (×3): 150 mg via ORAL
  Filled 2024-01-24 (×4): qty 2

## 2024-01-24 MED ORDER — LACTATED RINGERS IV BOLUS
1000.0000 mL | Freq: Once | INTRAVENOUS | Status: AC
  Administered 2024-01-24: 1000 mL via INTRAVENOUS

## 2024-01-24 MED ORDER — LACTATED RINGERS IV BOLUS
750.0000 mL | Freq: Once | INTRAVENOUS | Status: AC
  Administered 2024-01-24: 750 mL via INTRAVENOUS

## 2024-01-24 MED ORDER — VASOPRESSIN 20 UNITS/100 ML INFUSION FOR SHOCK
0.0000 [IU]/min | INTRAVENOUS | Status: DC
Start: 2024-01-24 — End: 2024-01-25

## 2024-01-24 MED ORDER — LACTATED RINGERS IV SOLN: INTRAVENOUS | Status: DC

## 2024-01-24 MED ORDER — LACTATED RINGERS IV BOLUS (SEPSIS): 1000.0000 mL | Freq: Once | INTRAVENOUS | Status: DC

## 2024-01-24 MED ORDER — BUDESONIDE 0.5 MG/2ML IN SUSP
0.5000 mg | Freq: Two times a day (BID) | RESPIRATORY_TRACT | Status: DC
  Administered 2024-01-24 – 2024-01-26 (×5): 0.5 mg via RESPIRATORY_TRACT
  Filled 2024-01-24 (×6): qty 2

## 2024-01-24 MED ORDER — METRONIDAZOLE 500 MG/100ML IV SOLN
500.0000 mg | Freq: Two times a day (BID) | INTRAVENOUS | Status: DC
  Administered 2024-01-24 – 2024-01-25 (×2): 500 mg via INTRAVENOUS
  Filled 2024-01-24 (×2): qty 100

## 2024-01-24 MED ORDER — VANCOMYCIN HCL 2000 MG/400ML IV SOLN
2000.0000 mg | Freq: Once | INTRAVENOUS | Status: AC
  Administered 2024-01-24: 2000 mg via INTRAVENOUS
  Filled 2024-01-24: qty 400

## 2024-01-24 MED ORDER — DULOXETINE HCL 60 MG PO CPEP
60.0000 mg | ORAL_CAPSULE | Freq: Every day | ORAL | Status: DC
  Administered 2024-01-24 – 2024-01-27 (×4): 60 mg via ORAL
  Filled 2024-01-24: qty 2
  Filled 2024-01-24 (×2): qty 1
  Filled 2024-01-24: qty 2

## 2024-01-24 MED ORDER — NOREPINEPHRINE 4 MG/250ML-% IV SOLN
0.0000 ug/min | INTRAVENOUS | Status: DC
  Administered 2024-01-24: 16 ug/min via INTRAVENOUS
  Administered 2024-01-24: 2 ug/min via INTRAVENOUS
  Administered 2024-01-24: 10 ug/min via INTRAVENOUS
  Filled 2024-01-24 (×3): qty 250

## 2024-01-24 MED ORDER — SODIUM CHLORIDE 0.9 % IV SOLN
2.0000 g | Freq: Two times a day (BID) | INTRAVENOUS | Status: DC
  Administered 2024-01-24: 2 g via INTRAVENOUS
  Filled 2024-01-24: qty 12.5

## 2024-01-24 MED ORDER — LACTATED RINGERS IV BOLUS (SEPSIS)
400.0000 mL | Freq: Once | INTRAVENOUS | Status: AC
  Administered 2024-01-24: 400 mL via INTRAVENOUS

## 2024-01-24 MED ORDER — ALPRAZOLAM 0.5 MG PO TABS
0.2500 mg | ORAL_TABLET | Freq: Every day | ORAL | Status: DC
  Administered 2024-01-24 – 2024-01-26 (×3): 0.25 mg via ORAL
  Filled 2024-01-24 (×3): qty 1

## 2024-01-24 MED ORDER — PANTOPRAZOLE SODIUM 40 MG IV SOLR
40.0000 mg | INTRAVENOUS | Status: DC
  Administered 2024-01-24: 40 mg via INTRAVENOUS
  Filled 2024-01-24: qty 10

## 2024-01-24 MED ORDER — REVEFENACIN 175 MCG/3ML IN SOLN
175.0000 ug | Freq: Every day | RESPIRATORY_TRACT | Status: DC
  Administered 2024-01-25 – 2024-01-26 (×2): 175 ug via RESPIRATORY_TRACT
  Filled 2024-01-24 (×3): qty 3

## 2024-01-24 MED ORDER — TAMSULOSIN HCL 0.4 MG PO CAPS
0.4000 mg | ORAL_CAPSULE | Freq: Every day | ORAL | Status: DC
  Administered 2024-01-25 – 2024-01-27 (×3): 0.4 mg via ORAL
  Filled 2024-01-24 (×3): qty 1

## 2024-01-24 MED ORDER — SODIUM CHLORIDE 0.9 % IV SOLN
2.0000 g | Freq: Once | INTRAVENOUS | Status: AC
  Administered 2024-01-24: 2 g via INTRAVENOUS
  Filled 2024-01-24: qty 12.5

## 2024-01-24 MED ORDER — GABAPENTIN 100 MG PO CAPS
100.0000 mg | ORAL_CAPSULE | Freq: Three times a day (TID) | ORAL | Status: DC
  Administered 2024-01-24 – 2024-01-27 (×9): 100 mg via ORAL
  Filled 2024-01-24 (×9): qty 1

## 2024-01-24 MED ORDER — ONDANSETRON HCL 4 MG/2ML IJ SOLN: 4.0000 mg | Freq: Four times a day (QID) | INTRAMUSCULAR | Status: DC | PRN

## 2024-01-24 MED ORDER — VITAMIN D 25 MCG (1000 UNIT) PO TABS
1000.0000 [IU] | ORAL_TABLET | Freq: Every day | ORAL | Status: DC
  Administered 2024-01-25 – 2024-01-27 (×3): 1000 [IU] via ORAL
  Filled 2024-01-24 (×3): qty 1

## 2024-01-24 MED ORDER — APIXABAN 5 MG PO TABS
5.0000 mg | ORAL_TABLET | Freq: Two times a day (BID) | ORAL | Status: DC
  Administered 2024-01-24 – 2024-01-27 (×6): 5 mg via ORAL
  Filled 2024-01-24 (×6): qty 1

## 2024-01-24 MED ORDER — ACETAMINOPHEN 10 MG/ML IV SOLN: 1000.0000 mg | Freq: Four times a day (QID) | INTRAVENOUS | Status: DC

## 2024-01-24 MED ORDER — TRAMADOL HCL 50 MG PO TABS
50.0000 mg | ORAL_TABLET | Freq: Four times a day (QID) | ORAL | Status: DC | PRN
  Administered 2024-01-24 – 2024-01-26 (×5): 50 mg via ORAL
  Filled 2024-01-24 (×6): qty 1

## 2024-01-24 MED ORDER — ALBUTEROL SULFATE (2.5 MG/3ML) 0.083% IN NEBU: 2.5000 mg | INHALATION_SOLUTION | RESPIRATORY_TRACT | Status: DC | PRN

## 2024-01-24 MED ORDER — CARISOPRODOL 350 MG PO TABS
350.0000 mg | ORAL_TABLET | Freq: Four times a day (QID) | ORAL | Status: DC
  Administered 2024-01-24 – 2024-01-27 (×10): 350 mg via ORAL
  Filled 2024-01-24 (×11): qty 1

## 2024-01-24 MED ORDER — HYDROCORTISONE SOD SUC (PF) 100 MG IJ SOLR
100.0000 mg | Freq: Two times a day (BID) | INTRAMUSCULAR | Status: DC
  Administered 2024-01-24 – 2024-01-25 (×2): 100 mg via INTRAVENOUS
  Filled 2024-01-24 (×2): qty 2

## 2024-01-24 MED ORDER — ARFORMOTEROL TARTRATE 15 MCG/2ML IN NEBU
15.0000 ug | INHALATION_SOLUTION | Freq: Two times a day (BID) | RESPIRATORY_TRACT | Status: DC
  Administered 2024-01-24 – 2024-01-26 (×5): 15 ug via RESPIRATORY_TRACT
  Filled 2024-01-24 (×6): qty 2

## 2024-01-24 NOTE — H&P (Signed)
 NAME:  [name], MRN:  [id number], DOB:  07-30-1956, LOS: 0 ADMISSION DATE:  01/24/2024, CONSULTATION DATE: 4/11 REFERRING MD: Maple Hudson, CHIEF COMPLAINT:  septic shock    History of Present Illness:  68 year old male patient With extensive cardiac and medical history as outlined below.  Ambulates with a cane for last 15 years, actually last seen in our clinic 3 days prior to admission for routine follow-up noting baseline exertional dyspnea felt to be deconditioning and untreated OSA with nocturnal hypoxia superimposed on his underlying lung disease.  Was is in his typical state of health up until this morning on 4/11 when he woke up, and had large-volume liquid diarrhea.  He had trouble getting up from the commode so called EMS because of weakness he subsequently had a witnessed syncopal event by EMS systolic blood pressure in the 50s to 60s.  Has had no nausea or vomiting, no change in stool color, no blood, no fevers, no recent food intake concerning for food poisoning, his abdomen is tender to palpation only.  Denies shortness of breath beyond baseline.  Weakness is noted above.  Was seen initially by emergency room staff and treated with IV fluid resuscitation, he was also started on empiric antibiotics, CT of head was obtained given his syncopal event this was negative for acute injury.  After receiving 2 L of crystalloid resuscitation effort he remained hypotensive with systolic blood pressure in the 70s and because of this he was started on a norepinephrine infusion and pulmonary critical care was asked to evaluate  Pertinent  Medical History   Former smoker, childhood asthma, COPD, obstructive sleep apnea not on CPAP, type 2 diabetes, hypertension, atrial fibrillation on Eliquis, nonischemic cardiomyopathy status post AICD, with EF 40% and global hypokinesis, chronic diastolic heart failure, GERD Significant Hospital Events: Including procedures, antibiotic start and stop dates in addition to  other pertinent events   68 year old male patient with extensive medical history admitted with diarrhea, dehydration, syncopal event and hypovolemic versus septic shock.  He is on chronic steroids and receives intermittent steroid injections.  Started on broad-spectrum antibiotics, norepinephrine, stress dose steroids, and vasopressin  Interim History / Subjective:  Reports chronic neck pain, also has some discomfort to palpation of his abdomen  Objective   Blood pressure (!) 73/33, pulse 70, temperature (!) 95.4 F (35.2 C), temperature source Rectal, resp. rate (!) 24, height 5\' 10"  (1.778 m), weight 120.7 kg, SpO2 97%.        Intake/Output Summary (Last 24 hours) at 01/24/2024 1509 Last data filed at 01/24/2024 1507 Gross per 24 hour  Intake 14.92 ml  Output --  Net 14.92 ml   Filed Weights   01/24/24 1251  Weight: 120.7 kg    Examination: General: This is a chronically ill-appearing 68 year old male he is laying in bed and currently in no acute distress but does endorse chronic neck discomfort HENT: Normocephalic atraumatic his neck is contracted towards the right with his ear almost touching the right shoulder no JVD mucous membranes moist Lungs: Clear and currently on 4 L/min no accessory use Cardiovascular: Regular rhythm without murmur rub or gallop Abdomen: Soft, tender to palpation, bowel sounds are present Extremities: Warm, no significant edema.  Chronic venous discoloration, has a small skin tear on the left lower extremity Neuro: Awake oriented no focal deficits has chronic upper extremity weakness ambulates with a cane at baseline GU: Due to void  Resolved Hospital Problem list     Assessment & Plan:  Hypovolemic and  septic shock with lactic acidosis secondary to what is likely a viral gastro enteritis, complicated by diarrhea, volume depletion, and relative adrenal insufficiency.  Note he is also on chronic Dupilumab managed by derm so increased risk of  immunosuppression  Plan Continuing crystalloid resuscitation IV Flagyl and cefepime Send GI PCR and stool culture  Ensure cultures are sent Stress dose steroids given chronic pred at 10 mg a day Holding his Cozaar, Coreg CT abd  HFrEF, nonischemic cardiomyopathy with EF 40% complicated further by diastolic heart failure Plan Continuing telemetry Holding his beta-blocker and calcium channel blocker If shock state continues might need to consider follow-up echocardiogram  AKI Plan Holding his diuretic Volume resuscitation Renal dose medications Strict intake output Serial chemistries  Atrial fibrillation Plan Continue with DOAC Telemetry Rate control Holding his Coreg  COPD, with chronic dyspnea Plan Scheduled bronchodilators and inhaled corticosteroids Supplemental oxygen Routine chest x-ray  Untreated sleep apnea Plan O2 as indicated Pulse oximetry at at bedtime  Remote cervical spine fracture, involving C3-C4 with contraction and chronic pain of the neck Plan As needed analgesia    Best Practice (right click and "Reselect all SmartList Selections" daily)   Diet/type: NPO DVT prophylaxis DOAC Pressure ulcer(s): present on admission  left LE skin tear  GI prophylaxis: PPI Lines: N/A Foley:  N/A Code Status:  full code Last date of multidisciplinary goals of care discussion [pending]  Labs   CBC: Recent Labs  Lab 01/24/24 1307  WBC 20.6*  NEUTROABS 17.3*  HGB 13.9  HCT 44.5  MCV 96.1  PLT 371    Basic Metabolic Panel: Recent Labs  Lab 01/24/24 1307  NA 134*  K 4.4  CL 99  CO2 19*  GLUCOSE 83  BUN 58*  CREATININE 2.48*  CALCIUM 9.3   GFR: Estimated Creatinine Clearance: 36.6 mL/min (A) (by C-G formula based on SCr of 2.48 mg/dL (H)). Recent Labs  Lab 01/24/24 1307 01/24/24 1315  WBC 20.6*  --   LATICACIDVEN  --  3.5*    Liver Function Tests: No results for input(s): "AST", "ALT", "ALKPHOS", "BILITOT", "PROT", "ALBUMIN" in  the last 168 hours. No results for input(s): "LIPASE", "AMYLASE" in the last 168 hours. No results for input(s): "AMMONIA" in the last 168 hours.  ABG    Component Value Date/Time   HCO3 27.9 01/28/2017 1222   HCO3 29.1 (H) 01/28/2017 1222   TCO2 29 01/28/2017 1222   TCO2 31 01/28/2017 1222   ACIDBASEDEF 1.0 03/21/2016 1101   O2SAT 70.0 01/28/2017 1222   O2SAT 71.0 01/28/2017 1222     Coagulation Profile: Recent Labs  Lab 01/24/24 1307  INR 1.2    Cardiac Enzymes: No results for input(s): "CKTOTAL", "CKMB", "CKMBINDEX", "TROPONINI" in the last 168 hours.  HbA1C: Hgb A1c MFr Bld  Date/Time Value Ref Range Status  01/07/2024 03:46 PM 6.0 4.6 - 6.5 % Final    Comment:    Glycemic Control Guidelines for People with Diabetes:Non Diabetic:  <6%Goal of Therapy: <7%Additional Action Suggested:  >8%   07/01/2023 02:39 PM 5.9 4.6 - 6.5 % Final    Comment:    Glycemic Control Guidelines for People with Diabetes:Non Diabetic:  <6%Goal of Therapy: <7%Additional Action Suggested:  >8%     CBG: Recent Labs  Lab 01/24/24 1447  GLUCAP 105*    Review of Systems:   Review of Systems  Constitutional:  Positive for malaise/fatigue. Negative for fever.  HENT: Negative.    Eyes: Negative.   Respiratory: Negative.  Cardiovascular:  Positive for chest pain and leg swelling.  Gastrointestinal:  Positive for diarrhea.  Genitourinary: Negative.   Musculoskeletal: Negative.   Skin: Negative.   Neurological:  Positive for dizziness and weakness.  Endo/Heme/Allergies:  Bruises/bleeds easily.     Past Medical History:  He,  has a past medical history of AICD (automatic cardioverter/defibrillator) present, Anemia, ANXIETY, Arthritis, Asthma, Cardiomyopathy (HCC), Chronic constipation, COPD (chronic obstructive pulmonary disease) (HCC), COVID-19 (12/09/2019), DEPRESSION, Diverticulitis, DYSKINESIA, ESOPHAGUS, Essential hypertension, FIBROMYALGIA, GERD (gastroesophageal reflux disease),  Glaucoma, HYPERLIPIDEMIA, INSOMNIA, Myocardial infarction (HCC), O2 dependent, Paroxysmal atrial fibrillation (HCC), Peripheral neuropathy, Pneumonia (12/2016), Rash and other nonspecific skin eruption (04/12/2009), SLEEP APNEA, OBSTRUCTIVE, Syncope, and Type II diabetes mellitus (HCC).   Surgical History:   Past Surgical History:  Procedure Laterality Date   ACNE CYST REMOVAL     2 on back    AV NODE ABLATION N/A 10/25/2017   Procedure: AV NODE ABLATION;  Surgeon: Duke Salvia, MD;  Location: [facility] INVASIVE CV LAB;  Service: Cardiovascular;  Laterality: N/A;   BIV ICD INSERTION CRT-D N/A 09/18/2017   Procedure: BIV ICD INSERTION CRT-D;  Surgeon: Duke Salvia, MD;  Location: [facility] INVASIVE CV LAB;  Service: Cardiovascular;  Laterality: N/A;   CARDIAC CATHETERIZATION N/A 03/21/2016   Procedure: Right/Left Heart Cath and Coronary Angiography;  Surgeon: Laurey Morale, MD;  Location: [facility] INVASIVE CV LAB;  Service: Cardiovascular;  Laterality: N/A;   CARDIOVERSION  04/18/2012   Procedure: CARDIOVERSION;  Surgeon: Pricilla Riffle, MD;  Location: [facility] OR;  Service: Cardiovascular;  Laterality: N/A;   CARDIOVERSION  04/25/2012   Procedure: CARDIOVERSION;  Surgeon: Vesta Mixer, MD;  Location: [facility] ENDOSCOPY;  Service: Cardiovascular;  Laterality: N/A;   CARDIOVERSION  04/25/2012   Procedure: CARDIOVERSION;  Surgeon: Pricilla Riffle, MD;  Location: [facility] OR;  Service: Cardiovascular;  Laterality: N/A;   CARDIOVERSION  05/09/2012   Procedure: CARDIOVERSION;  Surgeon: Tonny Bollman, MD;  Location: [facility] OR;  Service: Cardiovascular;  Laterality: N/A;  changed from crenshaw to cooper by trish/leone-endo   CARDIOVERSION N/A 03/15/2015   Procedure: CARDIOVERSION;  Surgeon: Vesta Mixer, MD;  Location: [facility] ENDOSCOPY;  Service: Cardiovascular;  Laterality: N/A;   COLONOSCOPY     COLONOSCOPY WITH PROPOFOL N/A 10/21/2014   Procedure: COLONOSCOPY WITH PROPOFOL;  Surgeon: Meryl Dare, MD;  Location: [facility] ENDOSCOPY;  Service:  Endoscopy;  Laterality: N/A;   EP IMPLANTABLE DEVICE N/A 04/06/2015   Procedure: Loop Recorder Insertion;  Surgeon: Marinus Maw, MD;  Location: [facility] INVASIVE CV LAB;  Service: Cardiovascular;  Laterality: N/A;   ESOPHAGOGASTRODUODENOSCOPY     JOINT REPLACEMENT     LOOP RECORDER REMOVAL N/A 09/18/2017   Procedure: LOOP RECORDER REMOVAL;  Surgeon: Duke Salvia, MD;  Location: [facility] INVASIVE CV LAB;  Service: Cardiovascular;  Laterality: N/A;   RIGHT/LEFT HEART CATH AND CORONARY ANGIOGRAPHY N/A 01/28/2017   Procedure: Right/Left Heart Cath and Coronary Angiography;  Surgeon: Laurey Morale, MD;  Location: [facility] INVASIVE CV LAB;  Service: Cardiovascular;  Laterality: N/A;   TEE WITHOUT CARDIOVERSION  04/25/2012   Procedure: TRANSESOPHAGEAL ECHOCARDIOGRAM (TEE);  Surgeon: Vesta Mixer, MD;  Location: [facility] ENDOSCOPY;  Service: Cardiovascular;  Laterality: N/A;   TEE WITHOUT CARDIOVERSION N/A 03/15/2015   Procedure: TRANSESOPHAGEAL ECHOCARDIOGRAM (TEE);  Surgeon: Vesta Mixer, MD;  Location: [facility] ENDOSCOPY;  Service: Cardiovascular;  Laterality: N/A;   TONSILLECTOMY AND ADENOIDECTOMY     TOTAL KNEE ARTHROPLASTY Right 06/15/2014   Procedure: TOTAL KNEE ARTHROPLASTY;  Surgeon: Sheral Apley, MD;  Location: [facility] OR;  Service: Orthopedics;  Laterality: Right;   TOTAL KNEE ARTHROPLASTY Left 10/17/2021   Procedure: Left TOTAL KNEE ARTHROPLASTY;  Surgeon: Kathryne Hitch, MD;  Location: [facility] OR;  Service: Orthopedics;  Laterality: Left;     Social History:   reports that he quit smoking about 16 years ago. His smoking use included cigarettes. He started smoking about 46 years ago. He has a 60 pack-year smoking history. He has never used smokeless tobacco. He reports that he does not drink alcohol and does not use drugs.   Family History:  His family history includes Allergies in his mother; Asthma in his maternal grandmother and mother; COPD in his mother; Colon polyps in his mother; Hypothyroidism in his  mother. There is no history of Colon cancer.   Allergies Allergies  Allergen Reactions   Tape Other (See Comments)    SKIN TEARS VERY, VERY EASILY!!!! Use Paper Tape Only, PLEASE!!!   Amiodarone Other (See Comments)    Caused hyperthyroidism    Statins Other (See Comments)    Myalgias    Sulfa Antibiotics Other (See Comments)    Mouth ulcers   Wound Dressing Adhesive Other (See Comments)    Skin Tears Use Paper Tape Only     Home Medications  Prior to Admission medications   Medication Sig Start Date End Date Taking? Authorizing Provider  acetaminophen (TYLENOL) 500 MG tablet Take 500-1,000 mg by mouth every 6 (six) hours as needed for mild pain or headache.    [provider]  albuterol (PROVENTIL) (2.5 MG/3ML) 0.083% nebulizer solution Take 3 mLs (2.5 mg total) by nebulization every 6 (six) hours as needed for wheezing or shortness of breath. 10/17/23   Luciano Cutter, MD  albuterol (VENTOLIN HFA) 108 (90 Base) MCG/ACT inhaler INHALE 1-2 PUFFS BY MOUTH EVERY 6 HOURS AS NEEDED FOR WHEEZE OR SHORTNESS OF BREATH 11/22/23   Luciano Cutter, MD  ALPRAZolam Prudy Feeler) 0.5 MG tablet TAKE 2 TABLETS (1 MG TOTAL) BY MOUTH EVERY DAY AT BEDTIME 12/13/23   [name], MD  ammonium lactate (AMLACTIN) 12 % cream Apply 1 Application topically as needed for dry skin. 12/25/22   McDonald, Rachelle Hora, DPM  apixaban (ELIQUIS) 5 MG TABS tablet Take 1 tablet (5 mg total) by mouth 2 (two) times daily. 06/13/23   Laurey Morale, MD  ascorbic acid (VITAMIN C) 500 MG tablet Take 500 mg by mouth daily.    [provider]  augmented betamethasone dipropionate (DIPROLENE-AF) 0.05 % cream Apply 1 application  topically daily as needed (to affected areas- for itching). 10/11/20   [provider]  carisoprodol (SOMA) 350 MG tablet Take 1 tablet (350 mg total) by mouth 4 (four) times daily. 12/24/23   [name], MD  carvedilol (COREG) 3.125 MG tablet Take 3.125 mg by mouth 2 (two) times  daily. 01/10/24   [provider]  Cholecalciferol (VITAMIN D3) 1000 units CAPS Take 1,000 Units by mouth daily.    [provider]  clotrimazole-betamethasone (LOTRISONE) cream Apply 1 Application topically daily. 02/22/23   Henson, Vickie L, NP-C  doxepin (SINEQUAN) 10 MG capsule TAKE 2 CAPSULES BY MOUTH AT BEDTIME AS NEEDED 09/26/23   [name], MD  DULoxetine (CYMBALTA) 60 MG capsule TAKE 1 CAPSULE BY MOUTH EVERY DAY 05/20/23   [name], MD  Dupilumab (DUPIXENT) 300 MG/2ML SOPN Inject 300 mg into the skin every 14 (fourteen) days.  [provider]  Evolocumab (REPATHA SURECLICK) 140 MG/ML SOAJ Inject 140 mg into the skin every 14 (fourteen) days. 01/24/23   Laurey Morale, MD  fluconazole (DIFLUCAN) 100 MG tablet Take 1 tablet (100 mg total) by mouth daily. 10/15/23   [name], MD  fluticasone [facility]) 50 MCG/ACT nasal spray Place 2 sprays into both nostrils daily as needed for allergies or rhinitis. 07/17/23   [name], MD  fluticasone-salmeterol [facility] INHUB) 250-50 MCG/ACT AEPB Inhale 1 puff into the lungs in the morning and at bedtime. 01/21/24   Luciano Cutter, MD  gabapentin (NEURONTIN) 100 MG capsule Take 1 capsule (100 mg total) by mouth 3 (three) times daily. 01/16/24   Barbaraann Share, DPM  hydrOXYzine (ATARAX) 10 MG tablet TAKE 1 TABLET BY MOUTH 3 TIMES DAILY AS NEEDED FOR ITCHING. Strength: 10 mg 12/28/22   [name], MD  ipratropium-albuterol (DUONEB) 0.5-2.5 (3) MG/3ML SOLN TAKE 3 ML BY NEBULIZATION EVERY 4 HOURS AS NEEDED (WHEEZING). 01/29/23   [name], MD  ketoconazole (NIZORAL) 2 % cream Apply 1 Application topically daily. 10/15/23   [name], MD  losartan (COZAAR) 25 MG tablet Take 1 tablet (25 mg total) by mouth daily. 01/17/23   Robbie Lis M, PA-C  mupirocin ointment (BACTROBAN) 2 % Apply 1 Application topically 2 (two) times daily for 14 days. Apply to right 2nd toe and to back of left heel 01/16/24 01/30/24  Bronwen Betters L, DPM  nystatin (MYCOSTATIN/NYSTOP) powder Use as directed twice per day as needed to the affected area 05/23/23   [name], MD  omeprazole (PRILOSEC) 20 MG capsule TAKE 1 CAPSULE BY MOUTH TWICE A DAY 10/31/23   [name], MD  potassium chloride SA (KLOR-CON M20) 20 MEQ tablet Take 1 tablet (20 mEq total) by mouth daily. TAKE (2 TABLETS) IN THE MORNING Patient taking differently: Take 20 mEq by mouth daily. TAKE 20 MEQ (1 TABLETS) IN THE MORNING 09/04/23   Laurey Morale, MD  predniSONE (DELTASONE) 10 MG tablet TAKE 1 TABLET (10 MG TOTAL) BY MOUTH DAILY WITH BREAKFAST. 01/17/24   Judi Saa, DO  Respiratory Therapy Supplies (FULL KIT NEBULIZER SET) MISC 1 each by Does not apply route as needed. 09/24/22   Garnette Gunner, MD  silver sulfADIAZINE (SILVADENE) 1 % cream APPLY TOPICALLY TO AFFECTED AREA EVERY DAY 02/25/23   [name], MD  tamsulosin (FLOMAX) 0.4 MG CAPS capsule TAKE 1 CAPSULE BY MOUTH EVERY DAY 03/12/23   [name], MD  Tiotropium Bromide Monohydrate (SPIRIVA RESPIMAT) 2.5 MCG/ACT AERS Inhale 2 puffs into the lungs daily. 09/10/23   Luciano Cutter, MD  tirzepatide [facility]) 2.5 MG/0.5ML Pen Inject 2.5 mg into the skin once a week. 01/03/24   [name], MD  tiZANidine (ZANAFLEX) 4 MG tablet Take 4 mg by mouth 3 (three) times daily. 12/28/23   [provider]  torsemide (DEMADEX) 20 MG tablet TAKE 5 TABLETS (100 MG TOTAL) BY MOUTH EVERY MORNING AND 4 TABLETS (80 MG TOTAL) EVERY EVENING. 01/21/24   Laurey Morale, MD  traMADol (ULTRAM) 50 MG tablet Take 1 tablet (50 mg total) by mouth 4 (four) times daily. 01/09/24   [name], MD  traZODone (DESYREL) 100 MG tablet Take 200 mg by mouth at bedtime as needed. 01/08/24   [provider]  TUMS 500 MG chewable tablet Chew 500-2,000 mg by mouth every 4 (four) hours as needed  for indigestion or heartburn. 10/19/16   [provider]  valACYclovir (VALTREX) 1000 MG tablet Take 1 tablet  (1,000 mg total) by mouth 3 (three) times daily. 09/18/23   [name], MD  venlafaxine XR (EFFEXOR-XR) 150 MG 24 hr capsule TAKE 1 CAPSULE BY MOUTH DAILY WITH BREAKFAST. 07/09/23   [name], MD  zolpidem (AMBIEN) 10 MG tablet Take 1 tablet (10 mg total) by mouth at bedtime as needed for sleep. 09/18/23   [name], MD     Critical care time: 35 min

## 2024-01-24 NOTE — ED Provider Notes (Signed)
 [place] EMERGENCY DEPARTMENT AT [facility] Provider Note   CSN: [id number] Arrival date & time: 01/24/24  1237     History  No chief complaint on file.   GADDIEL CULLENS is a 68 y.o. male.  The history is provided by the patient, the EMS personnel and medical records. No language interpreter was used.     68 year old male history of diabetes, paroxysmal atrial fibrillation, fibromyalgia, cardiac disease, COPD, has an AICD, obstructive sleep apnea brought here via EMS for evaluation of syncope.  Patient states this morning he went to use the bathroom and had diarrhea.  Afterward he was having trouble get out of the commode and had to call EMS to get lift assist.  When they arrived, he felt lightheadedness and had a syncopal episode that was witnessed by EMS.  Patient brought here for further evaluation.  He was noted to be hypotensive.  Patient states he is having some lightheadedness and dizziness.  He endorsed feeling weak.  He does not endorse any significant headache or chest pain no trouble breathing.  He denies any abdominal pain.  He is up-to-date with tetanus.  He did report starting a new medication for his pain,? Gabapentin.  He is also on diuretic and have been compliant with it.  Denies any other medication changes or denies any recent sickness.  Home Medications Prior to Admission medications   Medication Sig Start Date End Date Taking? Authorizing Provider  acetaminophen (TYLENOL) 500 MG tablet Take 500-1,000 mg by mouth every 6 (six) hours as needed for mild pain or headache.    [provider]  albuterol (PROVENTIL) (2.5 MG/3ML) 0.083% nebulizer solution Take 3 mLs (2.5 mg total) by nebulization every 6 (six) hours as needed for wheezing or shortness of breath. 10/17/23   Luciano Cutter, MD  albuterol (VENTOLIN HFA) 108 (90 Base) MCG/ACT inhaler INHALE 1-2 PUFFS BY MOUTH EVERY 6 HOURS AS NEEDED FOR WHEEZE OR SHORTNESS OF BREATH 11/22/23   Luciano Cutter, MD  ALPRAZolam Prudy Feeler) 0.5 MG tablet TAKE 2 TABLETS (1 MG TOTAL) BY MOUTH EVERY DAY AT BEDTIME 12/13/23   [name], MD  ammonium lactate (AMLACTIN) 12 % cream Apply 1 Application topically as needed for dry skin. 12/25/22   McDonald, Rachelle Hora, DPM  apixaban (ELIQUIS) 5 MG TABS tablet Take 1 tablet (5 mg total) by mouth 2 (two) times daily. 06/13/23   Laurey Morale, MD  ascorbic acid (VITAMIN C) 500 MG tablet Take 500 mg by mouth daily.    [provider]  augmented betamethasone dipropionate (DIPROLENE-AF) 0.05 % cream Apply 1 application  topically daily as needed (to affected areas- for itching). 10/11/20   [provider]  carisoprodol (SOMA) 350 MG tablet Take 1 tablet (350 mg total) by mouth 4 (four) times daily. 12/24/23   [name], MD  carvedilol (COREG) 3.125 MG tablet Take 3.125 mg by mouth 2 (two) times daily. 01/10/24   [provider]  Cholecalciferol (VITAMIN D3) 1000 units CAPS Take 1,000 Units by mouth daily.    [provider]  clotrimazole-betamethasone (LOTRISONE) cream Apply 1 Application topically daily. 02/22/23   Henson, Vickie L, NP-C  doxepin (SINEQUAN) 10 MG capsule TAKE 2 CAPSULES BY MOUTH AT BEDTIME AS NEEDED 09/26/23   [name], MD  DULoxetine (CYMBALTA) 60 MG capsule TAKE 1 CAPSULE BY MOUTH EVERY DAY 05/20/23   [name], MD  Dupilumab (DUPIXENT) 300 MG/2ML SOPN Inject 300 mg into  the skin every 14 (fourteen) days.    [provider]  Evolocumab (REPATHA SURECLICK) 140 MG/ML SOAJ Inject 140 mg into the skin every 14 (fourteen) days. 01/24/23   Laurey Morale, MD  fluconazole (DIFLUCAN) 100 MG tablet Take 1 tablet (100 mg total) by mouth daily. 10/15/23   [name], MD  fluticasone [facility]) 50 MCG/ACT nasal spray Place 2 sprays into both nostrils daily as needed for allergies or rhinitis. 07/17/23   [name], MD  fluticasone-salmeterol [facility] INHUB) 250-50 MCG/ACT AEPB Inhale 1 puff into the lungs in  the morning and at bedtime. 01/21/24   Luciano Cutter, MD  gabapentin (NEURONTIN) 100 MG capsule Take 1 capsule (100 mg total) by mouth 3 (three) times daily. 01/16/24   Barbaraann Share, DPM  hydrOXYzine (ATARAX) 10 MG tablet TAKE 1 TABLET BY MOUTH 3 TIMES DAILY AS NEEDED FOR ITCHING. Strength: 10 mg 12/28/22   [name], MD  ipratropium-albuterol (DUONEB) 0.5-2.5 (3) MG/3ML SOLN TAKE 3 ML BY NEBULIZATION EVERY 4 HOURS AS NEEDED (WHEEZING). 01/29/23   [name], MD  ketoconazole (NIZORAL) 2 % cream Apply 1 Application topically daily. 10/15/23   [name], MD  losartan (COZAAR) 25 MG tablet Take 1 tablet (25 mg total) by mouth daily. 01/17/23   Robbie Lis M, PA-C  mupirocin ointment (BACTROBAN) 2 % Apply 1 Application topically 2 (two) times daily for 14 days. Apply to right 2nd toe and to back of left heel 01/16/24 01/30/24  Bronwen Betters L, DPM  nystatin (MYCOSTATIN/NYSTOP) powder Use as directed twice per day as needed to the affected area 05/23/23   [name], MD  omeprazole (PRILOSEC) 20 MG capsule TAKE 1 CAPSULE BY MOUTH TWICE A DAY 10/31/23   [name], MD  potassium chloride SA (KLOR-CON M20) 20 MEQ tablet Take 1 tablet (20 mEq total) by mouth daily. TAKE (2 TABLETS) IN THE MORNING Patient taking differently: Take 20 mEq by mouth daily. TAKE 20 MEQ (1 TABLETS) IN THE MORNING 09/04/23   Laurey Morale, MD  predniSONE (DELTASONE) 10 MG tablet TAKE 1 TABLET (10 MG TOTAL) BY MOUTH DAILY WITH BREAKFAST. 01/17/24   Judi Saa, DO  Respiratory Therapy Supplies (FULL KIT NEBULIZER SET) MISC 1 each by Does not apply route as needed. 09/24/22   Garnette Gunner, MD  silver sulfADIAZINE (SILVADENE) 1 % cream APPLY TOPICALLY TO AFFECTED AREA EVERY DAY 02/25/23   [name], MD  tamsulosin (FLOMAX) 0.4 MG CAPS capsule TAKE 1 CAPSULE BY MOUTH EVERY DAY 03/12/23   [name], MD  Tiotropium Bromide Monohydrate (SPIRIVA RESPIMAT) 2.5 MCG/ACT AERS Inhale 2 puffs into the lungs  daily. 09/10/23   Luciano Cutter, MD  tirzepatide [facility]) 2.5 MG/0.5ML Pen Inject 2.5 mg into the skin once a week. 01/03/24   [name], MD  tiZANidine (ZANAFLEX) 4 MG tablet Take 4 mg by mouth 3 (three) times daily. 12/28/23   [provider]  torsemide (DEMADEX) 20 MG tablet TAKE 5 TABLETS (100 MG TOTAL) BY MOUTH EVERY MORNING AND 4 TABLETS (80 MG TOTAL) EVERY EVENING. 01/21/24   Laurey Morale, MD  traMADol (ULTRAM) 50 MG tablet Take 1 tablet (50 mg total) by mouth 4 (four) times daily. 01/09/24   [name], MD  traZODone (DESYREL) 100 MG tablet Take 200 mg by mouth at bedtime as needed. 01/08/24   [provider]  TUMS 500 MG chewable tablet Chew  500-2,000 mg by mouth every 4 (four) hours as needed for indigestion or heartburn. 10/19/16   [provider]  valACYclovir (VALTREX) 1000 MG tablet Take 1 tablet (1,000 mg total) by mouth 3 (three) times daily. 09/18/23   [name], MD  venlafaxine XR (EFFEXOR-XR) 150 MG 24 hr capsule TAKE 1 CAPSULE BY MOUTH DAILY WITH BREAKFAST. 07/09/23   [name], MD  zolpidem (AMBIEN) 10 MG tablet Take 1 tablet (10 mg total) by mouth at bedtime as needed for sleep. 09/18/23   [name], MD      Allergies    Tape, Amiodarone, Statins, Sulfa antibiotics, and Wound dressing adhesive    Review of Systems   Review of Systems  All other systems reviewed and are negative.   Physical Exam Updated Vital Signs BP (!) 65/22 (BP Location: Left Arm)   Pulse 70   Temp (!) 95.4 F (35.2 C) (Rectal)   Resp 20   Ht 5\' 10"  (1.778 m)   Wt 120.7 kg   SpO2 94%   BMI 38.17 kg/m  Physical Exam Constitutional:      General: He is not in acute distress.    Appearance: He is well-developed. He is obese.     Comments: Chronically ill and obese male laying in bed, head is tilted to the right, does not appear to be in any acute distress and able to communicate.  HENT:     Head: Atraumatic.  Eyes:     Conjunctiva/sclera:  Conjunctivae normal.  Cardiovascular:     Rate and Rhythm: Normal rate and regular rhythm.     Pulses: Normal pulses.     Heart sounds: Normal heart sounds.  Abdominal:     Palpations: Abdomen is soft.     Tenderness: There is no abdominal tenderness.  Musculoskeletal:     Cervical back: Normal range of motion and neck supple.  Skin:    Findings: No rash.     Comments: 4 cm skin tear noted to left lower anterior tibial region with surrounding ecchymosis but no deformity noted.  Neurological:     Mental Status: He is alert and oriented to person, place, and time.     ED Results / Procedures / Treatments   Labs (all labs ordered are listed, but only abnormal results are displayed) Labs Reviewed  I-STAT CG4 LACTIC ACID, ED - Abnormal; Notable for the following components:      Result Value   Lactic Acid, Venous 3.5 (*)    All other components within normal limits  CULTURE, BLOOD (ROUTINE X 2)  CULTURE, BLOOD (ROUTINE X 2)  BASIC METABOLIC PANEL WITH GFR  CBC WITH DIFFERENTIAL/PLATELET  PROTIME-INR  URINALYSIS, ROUTINE W REFLEX MICROSCOPIC  BRAIN NATRIURETIC PEPTIDE  CBG MONITORING, ED    EKG EKG Interpretation Date/Time:  Friday January 24 2024 13:08:17 EDT Ventricular Rate:  70 PR Interval:    QRS Duration:  174 QT Interval:  477 QTC Calculation: 515 R Axis:   83  Text Interpretation: Accelerated junctional rhythm Right bundle branch block Anterolateral infarct, age indeterminate Confirmed by Estanislado Pandy [phone]) on 01/24/2024 3:11:10 PM  Radiology CUP PACEART REMOTE DEVICE CHECK Result Date: 01/24/2024 Scheduled remote reviewed. Normal device function.  Presenting rhythm: BiV pace with PVC's Next remote 91 days. LA, CVRS  DG Foot Complete Right Result Date: 01/23/2024 Please see detailed radiograph report in office note.   Procedures Procedures    Medications Ordered in ED Medications  lactated ringers infusion (has no  administration in time range)   vancomycin (VANCOREADY) IVPB 2000 mg/400 mL (2,000 mg Intravenous New Bag/Given 01/24/24 1349)  norepinephrine (LEVOPHED) 4mg  in (0.016 mg/mL) premix infusion (7 mcg/min Intravenous Infusion Verify 01/24/24 1449)  lactated ringers bolus 1,000 mL (has no administration in time range)  lactated ringers bolus 1,000 mL (1,000 mLs Intravenous New Bag/Given 01/24/24 1303)  lactated ringers bolus 400 mL (400 mLs Intravenous New Bag/Given 01/24/24 1427)  ceFEPIme (MAXIPIME) 2 g in sodium chloride 0.9 % 100 mL IVPB (0 g Intravenous Stopped 01/24/24 1425)  metroNIDAZOLE (FLAGYL) IVPB 500 mg (0 mg Intravenous Stopped 01/24/24 1451)    ED Course/ Medical Decision Making/ A&P                                 Medical Decision Making Amount and/or Complexity of Data Reviewed Labs: ordered. Radiology: ordered.  Risk Prescription drug management.   BP 98/62   Pulse 70   Temp (!) 95.4 F (35.2 C) (Rectal)   Resp (!) 25   Ht 5\' 10"  (1.778 m)   Wt 120.7 kg   SpO2 99%   BMI 38.17 kg/m   12:4 PM  68 year old male history of diabetes, paroxysmal atrial fibrillation, fibromyalgia, cardiac disease, COPD, has an AICD, obstructive sleep apnea brought here via EMS for evaluation of syncope.  Patient states this morning he went to use the bathroom and had diarrhea.  Afterward he was having trouble get out of the commode and had to call EMS to get lift assist.  When they arrived, he felt lightheadedness and had a syncopal episode that was witnessed by EMS.  Patient brought here for further evaluation.  He was noted to be hypotensive.  Patient states he is having some lightheadedness and dizziness.  He endorsed feeling weak.  He does not endorse any significant headache or chest pain no trouble breathing.  He denies any abdominal pain.  He is up-to-date with tetanus.  He did report starting a new medication for his pain,? Gabapentin.  He is also on diuretic and have been compliant with it.  Denies any other  medication changes or denies any recent sickness.  Exam notable for an obese ill-appearing male who is able to [facility] and answering question appropriately.  He has a skin tear noted to his left lower leg.  He does not have any other signs of injury but he does have various bruising to his skin is likely from being on a blood thinner medication.  Vitals are notable for hypotension with a blood pressure of 65/22, but he is not tachycardic and he is not febrile.  Bedside vascular ultrasound was performed by Dr. Maple Hudson, it appears patient has a reasonable valvular movement and good heart squeeze however his IVC appears collapsed.  Patient appears dehydrated, will benefit from IV fluid.  Workup initiated.  -Labs ordered, independently viewed and interpreted by me.  Labs remarkable for lactic acid of 3.5 white count of 20.6 these suggestive of an underlying infection causing sepsis.  Code sepsis have been initiated IV fluid as well as broad-spectrum antibiotics have been started.  Creatinine is 2.48 which is significantly worse compared to 1.18 that was done 2 weeks ago.  This could be in the setting of increasing of his diuresis.  He is receiving IV fluid. -The patient was maintained on a cardiac monitor.  I personally viewed and interpreted the cardiac monitored which showed an underlying rhythm of:  NSR -Imaging including head CT scan and chest x-ray with result pending -This patient presents to the ED for concern of syncope, this involves an extensive number of treatment options, and is a complaint that carries with it a high risk of complications and morbidity.  The differential diagnosis includes sepsis, hypovolemia, anemia, stroke, pe, mi, infection, cardiac arrhythmia -Co morbidities that complicate the patient evaluation includes DM, HLD, CAD, afib -Treatment includes sepsis protocol -Reevaluation of the patient after these medicines showed that the patient improved -PCP office notes or outside  notes reviewed -Discussion with attending Dr. Maple Hudson. I have also consulted PCCM critical care intensivist who will see and admit pt as pt is experience septic shock -Escalation to admission/observation considered: patient is agreeable with admission  Blood pressure is improving with vasopressor.  Will monitor patient closely.        Final Clinical Impression(s) / ED Diagnoses Final diagnoses:  Sepsis with acute renal failure and septic shock, due to unspecified organism, unspecified acute renal failure type (HCC)  Syncope, unspecified syncope type    Rx / DC Orders ED Discharge Orders     None         Fayrene Helper, PA-C 01/24/24 1513    Coral Spikes, DO 01/24/24 1520

## 2024-01-24 NOTE — Sepsis Progress Note (Signed)
 Code sepsis protocol being monitored by eLink.

## 2024-01-24 NOTE — Consult Note (Signed)
 WOC Nurse Consult Note: Reason for Consult: skin tear L shin  Wound type: full thickness r/t trauma  Pressure Injury POA: NA  Measurement: see nursing flowsheet  Wound bed: dark hemorrhagic tissue  Drainage (amount, consistency, odor) see nursing flowsheet  Periwound: significant ecchymosis  Dressing procedure/placement/frequency: Cleanse L lower leg skin tear with Vashe wound cleanser [employer] [place] [phone]), do not rinse and allow to air dry. Apply Xeroform gauze [employer] [place] [phone]) to wound bed daily and cover with ABD pad and Kerlix roll gauze or silicone foam whichever works better.    Please reconsult WOC team if further needs arise.  May need chemical debridement such as Medihoney if develops necrotic tissue.    POC discussed with bedside nurse. WOC team will not follow. Re-consult as above.   Thank you,    Priscella Mann MSN, RN-BC, [employer] [phone]

## 2024-01-24 NOTE — Sepsis Progress Note (Signed)
 Bedside RN notified of need for a repeat Lactic acid. They are placing a CVC line and will draw as soon as able.

## 2024-01-24 NOTE — Procedures (Signed)
 Central Venous Catheter Insertion Procedure Note  [name]  [id number]  Mar 05, 1956  Date:01/24/24  Time:5:33 PM   Provider Performing:[name]   Procedure: Insertion of Non-tunneled Central Venous [phone]) with US guidance ([id number])   Indication(s) Medication administration and Difficult access  Consent Risks of the procedure as well as the alternatives and risks of each were explained to the patient and/or caregiver.  Consent for the procedure was obtained verbally at bedside  w/ procedure progressing w/out signature due to urgency of situation  Anesthesia Topical only with 1% lidocaine   Timeout Verified patient identification, verified procedure, site/side was marked, verified correct patient position, special equipment/implants available, medications/allergies/relevant history reviewed, required imaging and test results available.  Sterile Technique Maximal sterile technique including full sterile barrier drape, hand hygiene, sterile gown, sterile gloves, mask, hair covering, sterile ultrasound probe cover (if used).  Procedure Description Area of catheter insertion was cleaned with chlorhexidine and draped in sterile fashion.  With real-time ultrasound guidance a central venous catheter was placed into the left internal jugular vein. Nonpulsatile blood flow and easy flushing noted in all ports.  The catheter was sutured in place and sterile dressing applied.  Complications/Tolerance None; patient tolerated the procedure well. Chest X-ray is ordered to verify placement for internal jugular or subclavian cannulation.   Chest x-ray is not ordered for femoral cannulation.  EBL Minimal  Specimen(s) None

## 2024-01-25 ENCOUNTER — Inpatient Hospital Stay (HOSPITAL_COMMUNITY)

## 2024-01-25 DIAGNOSIS — J449 Chronic obstructive pulmonary disease, unspecified: Secondary | ICD-10-CM | POA: Diagnosis not present

## 2024-01-25 DIAGNOSIS — I429 Cardiomyopathy, unspecified: Secondary | ICD-10-CM

## 2024-01-25 DIAGNOSIS — A419 Sepsis, unspecified organism: Secondary | ICD-10-CM | POA: Diagnosis not present

## 2024-01-25 DIAGNOSIS — R6521 Severe sepsis with septic shock: Secondary | ICD-10-CM

## 2024-01-25 LAB — COMPREHENSIVE METABOLIC PANEL WITH GFR
ALT: 51 U/L — ABNORMAL HIGH (ref 0–44)
ALT: 46 U/L — ABNORMAL HIGH (ref 0–44)
AST: 28 U/L (ref 15–41)
AST: 30 U/L (ref 15–41)
Albumin: 3 g/dL — ABNORMAL LOW (ref 3.5–5.0)
Albumin: 3.1 g/dL — ABNORMAL LOW (ref 3.5–5.0)
Alkaline Phosphatase: 104 U/L (ref 38–126)
Alkaline Phosphatase: 86 U/L (ref 38–126)
Anion gap: 11 (ref 5–15)
Anion gap: 10 (ref 5–15)
BUN: 49 mg/dL — ABNORMAL HIGH (ref 8–23)
BUN: 44 mg/dL — ABNORMAL HIGH (ref 8–23)
CO2: 21 mmol/L — ABNORMAL LOW (ref 22–32)
CO2: 21 mmol/L — ABNORMAL LOW (ref 22–32)
Calcium: 8.6 mg/dL — ABNORMAL LOW (ref 8.9–10.3)
Calcium: 9.1 mg/dL (ref 8.9–10.3)
Chloride: 102 mmol/L (ref 98–111)
Chloride: 104 mmol/L (ref 98–111)
Creatinine, Ser: 1.99 mg/dL — ABNORMAL HIGH (ref 0.61–1.24)
Creatinine, Ser: 1.68 mg/dL — ABNORMAL HIGH (ref 0.61–1.24)
GFR, Estimated: 36 mL/min — ABNORMAL LOW (ref >60)
GFR, Estimated: 44 mL/min — ABNORMAL LOW (ref >60)
Glucose, Bld: 136 mg/dL — ABNORMAL HIGH (ref 70–99)
Glucose, Bld: 131 mg/dL — ABNORMAL HIGH (ref 70–99)
Potassium: 4.2 mmol/L (ref 3.5–5.1)
Potassium: 4.4 mmol/L (ref 3.5–5.1)
Sodium: 134 mmol/L — ABNORMAL LOW (ref 135–145)
Sodium: 135 mmol/L (ref 135–145)
Total Bilirubin: 0.5 mg/dL (ref 0.0–1.2)
Total Bilirubin: 0.7 mg/dL (ref 0.0–1.2)
Total Protein: 5.7 g/dL — ABNORMAL LOW (ref 6.5–8.1)
Total Protein: 6.1 g/dL — ABNORMAL LOW (ref 6.5–8.1)

## 2024-01-25 LAB — BLOOD CULTURE ID PANEL (REFLEXED) - BCID2

## 2024-01-25 LAB — CBC
HCT: 37.2 % — ABNORMAL LOW (ref 39.0–52.0)
Hemoglobin: 11.8 g/dL — ABNORMAL LOW (ref 13.0–17.0)
MCH: 29.7 pg (ref 26.0–34.0)
MCHC: 31.7 g/dL (ref 30.0–36.0)
MCV: 93.7 fL (ref 80.0–100.0)
Platelets: 267 10*3/uL (ref 150–400)
RBC: 3.97 MIL/uL — ABNORMAL LOW (ref 4.22–5.81)
RDW: 14.2 % (ref 11.5–15.5)
WBC: 21.7 10*3/uL — ABNORMAL HIGH (ref 4.0–10.5)
nRBC: 0 % (ref 0.0–0.2)

## 2024-01-25 LAB — LACTIC ACID, PLASMA: Lactic Acid, Venous: 1.4 mmol/L (ref 0.5–1.9)

## 2024-01-25 LAB — PHOSPHORUS: Phosphorus: 5.2 mg/dL — ABNORMAL HIGH (ref 2.5–4.6)

## 2024-01-25 LAB — MAGNESIUM: Magnesium: 2.4 mg/dL (ref 1.7–2.4)

## 2024-01-25 MED ORDER — AMOXICILLIN-POT CLAVULANATE 875-125 MG PO TABS
1.0000 | ORAL_TABLET | Freq: Two times a day (BID) | ORAL | Status: DC
  Administered 2024-01-25 – 2024-01-27 (×5): 1 via ORAL
  Filled 2024-01-25 (×6): qty 1

## 2024-01-25 MED ORDER — PANTOPRAZOLE SODIUM 40 MG PO TBEC
40.0000 mg | DELAYED_RELEASE_TABLET | Freq: Every day | ORAL | Status: DC
  Administered 2024-01-25 – 2024-01-27 (×3): 40 mg via ORAL
  Filled 2024-01-25 (×3): qty 1

## 2024-01-25 MED ORDER — PREDNISONE 10 MG PO TABS
10.0000 mg | ORAL_TABLET | Freq: Every day | ORAL | Status: DC
Start: 2024-01-26 — End: 2024-01-27
  Administered 2024-01-26 – 2024-01-27 (×2): 10 mg via ORAL
  Filled 2024-01-25 (×2): qty 1

## 2024-01-25 MED ORDER — OXYCODONE HCL 5 MG PO TABS
5.0000 mg | ORAL_TABLET | ORAL | Status: DC | PRN
  Administered 2024-01-25 (×2): 5 mg via ORAL
  Filled 2024-01-25 (×3): qty 1

## 2024-01-25 MED ORDER — CHLORHEXIDINE GLUCONATE CLOTH 2 % EX PADS
6.0000 | MEDICATED_PAD | Freq: Every day | CUTANEOUS | Status: DC
  Administered 2024-01-25 – 2024-01-27 (×3): 6 via TOPICAL

## 2024-01-25 NOTE — Progress Notes (Signed)
 NAME:  [name], MRN:  [id number], DOB:  Nov 27, 1955, LOS: 1 ADMISSION DATE:  01/24/2024, CONSULTATION DATE: 4/11 REFERRING MD: Maple Hudson, CHIEF COMPLAINT:  septic shock    History of Present Illness:  68 year old male patient with extensive cardiac and medical history as outlined below.  Ambulates with a cane for last 15 years, actually last seen in our clinic 3 days prior to admission for routine follow-up noting baseline exertional dyspnea felt to be deconditioning and untreated OSA with nocturnal hypoxia superimposed on his underlying lung disease.  Was is in his typical state of health up until this morning on 4/11 when he woke up, and had large-volume liquid diarrhea.  He had trouble getting up from the commode so called EMS because of weakness he subsequently had a witnessed syncopal event by EMS systolic blood pressure in the 50s to 60s.  Has had no nausea or vomiting, no change in stool color, no blood, no fevers, no recent food intake concerning for food poisoning, his abdomen is tender to palpation only.  Denies shortness of breath beyond baseline.  Weakness is noted above.  Was seen initially by emergency room staff and treated with IV fluid resuscitation, he was also started on empiric antibiotics, CT of head was obtained given his syncopal event this was negative for acute injury.  After receiving 2 L of crystalloid resuscitation effort he remained hypotensive with systolic blood pressure in the 70s and because of this he was started on a norepinephrine infusion and pulmonary critical care was asked to evaluate  Pertinent  Medical History  Former smoker, childhood asthma, COPD, obstructive sleep apnea not on CPAP, type 2 diabetes, hypertension, atrial fibrillation on Eliquis, nonischemic cardiomyopathy status post AICD, with EF 40% and global hypokinesis, chronic diastolic heart failure, GERD  Significant Hospital Events: Including procedures, antibiotic start and stop dates in addition to  other pertinent events   4/11 admitted with diarrhea, dehydration, syncopal event and hypovolemic versus septic shock.  He is on chronic steroids and receives intermittent steroid injections.  Started on broad-spectrum antibiotics, norepinephrine, stress dose steroids, and vasopressin 4/12 vasopressors weaned off overnight  Interim History / Subjective:  Seen lying in bed with complaints of chronic neck and back pain  Objective   Blood pressure 94/61, pulse 79, temperature 98.4 F (36.9 C), temperature source Oral, resp. rate (!) 22, height 5\' 10"  (1.778 m), weight 122.5 kg, SpO2 97%. CVP:  [7 mmHg-13 mmHg] 12 mmHg      Intake/Output Summary (Last 24 hours) at 01/25/2024 4098 Last data filed at 01/25/2024 0600 Gross per 24 hour  Intake 2227.89 ml  Output 1100 ml  Net 1127.89 ml   Filed Weights   01/24/24 1251 01/24/24 1650 01/25/24 0500  Weight: 120.7 kg 122.7 kg 122.5 kg    Examination: General: Acute on chronic deconditioned middle-age male lying in bed in no acute distress HEENT: [place]/AT, MM pink/moist, PERRL,  Neuro: Alert and oriented x 3, nonfocal, weak globally CV: s1s2 regular rate and rhythm, no murmur, rubs, or gallops,  PULM: Clear to auscultation bilaterally, no increased work of breathing, no added breath sounds GI: soft, bowel sounds active in all 4 quadrants, non-tender, non-distended, tolerating oral diet Extremities: warm/dry, no edema  Skin: Scattered bruises in multiple stages of healing  Resolved Hospital Problem list     Assessment & Plan:  Hypovolemic and septic shock with lactic acidosis secondary to what is likely a viral gastro enteritis, complicated by diarrhea, volume depletion, and relative adrenal insufficiency.  -  Note he is also on chronic Dupilumab managed by derm so increased risk of immunosuppression  P: Remains on broad-spectrum antibiotics including cefepime and Flagyl Follow cultures Stress dose steroids given underlying adrenal  insufficiency Home antihypertensives remain on hold  HFrEF Nonischemic cardiomyopathy  -Echocardiogram 10/2023 with EF of 40 to 45%  P: Continuous telemetry  Resume home antihypertensives as able Continue home anticoagulation as below Heart health diet with sodium restriction Strict intake and output  Daily weight to assess volume status Daily assessment for need to diurese  Closely monitor renal function and electrolytes   Atrial fibrillation anticoagulated on baseline with Eliquis P: Continuous telemetry Continue home antihypertensives/GDMT as able  AKI -Creatinine on admission 2.48 with GFR 27 compared to creatinine 1.18 with GFR 63 March 2025 P: Follow renal function  Monitor urine output Trend Bmet Avoid nephrotoxins,  Ensure adequate renal perfusion  IV hydration Home diuretics remain on hold  COPD, with chronic dyspnea Untreated sleep apnea P: Continue with oxygen as needed for sat goal greater than 90 Triple neb therapy Aspiration precautions Mobilize as able yesterday  Remote cervical spine fracture, involving C3-C4 with contraction and chronic pain of the neck P: Multimodal pain control  Stable for transfer out of ICU and to hospitalist service starting 4/13  Best Practice (right click and "Reselect all SmartList Selections" daily)   Diet/type: NPO DVT prophylaxis DOAC Pressure ulcer(s): present on admission  left LE skin tear  GI prophylaxis: PPI Lines: N/A Foley:  N/A Code Status:  full code Last date of multidisciplinary goals of care discussion [pending]  Critical care time: NA   Jackalyn Haith D. Harris, NP-C [facility] Personal contact information can be found on Amion  If no contact or response made please call [phone] 01/25/2024, 7:33 AM

## 2024-01-25 NOTE — Progress Notes (Addendum)
 Inpatient Rehab Admissions Coordinator:   Per therapy recommendations, patient was screened for CIR candidacy by Wandalee Gust, MS, CCC-SLP. At this time, Pt. does not appear to demonstrate diagnoses sufficient to obtain insurance authorization to rehabilitation/CIR. I will not pursue a rehab consult for this Pt.   Recommend other rehab venues to be pursued.  Please contact me with any questions.  Wandalee Gust, MS, CCC-SLP Rehab Admissions Coordinator  [phone] (celll) [phone] (office)

## 2024-01-25 NOTE — Progress Notes (Signed)
 Lab just called that the patient's Potassium drawn at 6 pm jumped from 4.2 to 6.0. Will redraw it again to check for  accuracy.

## 2024-01-25 NOTE — Evaluation (Signed)
 Physical Therapy Evaluation Patient Details Name: [name] MRN: [id number] DOB: 06-Oct-1956 Today's Date: 01/25/2024  History of Present Illness  68 year old male presenting 4/11 with new onset diarrhea, weakness, syncope, sepsis. PMHx: COPD, asthma overlap syndrome, OSA, diabetes, hypertension, A-fib, cardiomyopathy.  Clinical Impression  Pt admitted with above diagnosis. PTA pt mod I with mobility using a cane. Girlfriend/SO does the cooking at home and drives pt to appointments but otherwise able to care for himself. Torticollis following cervical fracture last year, has received treatments on OP basis. He required up to mod assist for transfers today.  Able to ambulate short distance to restroom with great effort and min assist for walker control. Tremulous, not very confident. Demonstrates gross weakness and is easily fatigued. Unable to obtain an accurate/consistent SpO2 reading during session but suspect in mid 80s on RA, and 90s on 2L. BP 106/63. Patient will benefit from intensive inpatient follow-up therapy, >3 hours/day. Pt currently with functional limitations due to the deficits listed below (see PT Problem List). Pt will benefit from acute skilled PT to increase their independence and safety with mobility to allow discharge.           If plan is discharge home, recommend the following: A lot of help with walking and/or transfers;A lot of help with bathing/dressing/bathroom;Assistance with cooking/housework;Assist for transportation;Help with stairs or ramp for entrance   Can travel by private vehicle        Equipment Recommendations Other (comment) (TBD)  Recommendations for Other Services       Functional Status Assessment Patient has had a recent decline in their functional status and demonstrates the ability to make significant improvements in function in a reasonable and predictable amount of time.     Precautions / Restrictions Precautions Precautions: Fall Recall  of Precautions/Restrictions: Intact Precaution/Restrictions Comments: monitor BP Restrictions Weight Bearing Restrictions Per Provider Order: No      Mobility  Bed Mobility Overal bed mobility: Needs Assistance Bed Mobility: Supine to Sit     Supine to sit: Min assist     General bed mobility comments: Min assist for trunk support, cues for technique, effortful and slow    Transfers Overall transfer level: Needs assistance Equipment used: Rolling walker (2 wheels) Transfers: Sit to/from Stand Sit to Stand: Mod assist, From elevated surface           General transfer comment: Mod assist for boost to stand from bed, and from toilet with cues for hand placement (rail use in bathroom) and techniques to rise. Tremulous upon standing but able to stabilize with RW for support.    Ambulation/Gait Ambulation/Gait assistance: Min assist Gait Distance (Feet): 10 Feet (x2) Assistive device: Rolling walker (2 wheels) Gait Pattern/deviations: Step-through pattern, Decreased stride length, Trunk flexed, Wide base of support, Shuffle Gait velocity: dec Gait velocity interpretation: <1.31 ft/sec, indicative of household ambulator   General Gait Details: Small shuffled steps with cues for upright posture and step length with improved foot clearance. Tremulous. Min assist for RW control. Fatigues rapidly.  Stairs            Wheelchair Mobility     Tilt Bed    Modified Rankin (Stroke Patients Only)       Balance Overall balance assessment: Needs assistance Sitting-balance support: No upper extremity supported, Feet supported Sitting balance-Leahy Scale: Fair     Standing balance support: Bilateral upper extremity supported Standing balance-Leahy Scale: Poor Standing balance comment: CGA - Min assist for balance  Pertinent Vitals/Pain Pain Assessment Pain Assessment: Faces Faces Pain Scale: Hurts little more Pain Location:  back -chronic Pain Descriptors / Indicators: Aching    Home Living Family/patient expects to be discharged to:: Private residence Living Arrangements: Spouse/significant other (Girlfriend) Available Help at Discharge: Family;Available 24 hours/day Type of Home: House Home Access: Stairs to enter Entrance Stairs-Rails: Right Entrance Stairs-Number of Steps: 1   Home Layout: One level Home Equipment: Pharmacist, hospital (2 wheels);Cane - single point      Prior Function Prior Level of Function : Independent/Modified Independent;History of Falls (last six months)             Mobility Comments: Uses SPC ADLs Comments: States girlfriend does the cooking at home. No longer drives, she takes him places.     Extremity/Trunk Assessment   Upper Extremity Assessment Upper Extremity Assessment: Defer to OT evaluation    Lower Extremity Assessment Lower Extremity Assessment: Generalized weakness (Erythematous RLE)       Communication   Communication Communication: No apparent difficulties    Cognition Arousal: Alert Behavior During Therapy: WFL for tasks assessed/performed   PT - Cognitive impairments: No apparent impairments                         Following commands: Intact       Cueing Cueing Techniques: Verbal cues     General Comments General comments (skin integrity, edema, etc.): Poor waveform with SpO2 check; shows mid 90s on 2L, mid 80s on RA with 2/3 dyspnea after walking to bathroom but quickly rebounded to mid 90s after sitting. BP 106/63. Noted skin tear Rt arm while ambulating back from bathroom, possibly brushed against wall in restroom. RN notified - applied dressing.  Pt appreciative for steps taken to cover.    Exercises     Assessment/Plan    PT Assessment Patient needs continued PT services  PT Problem List Decreased strength;Decreased range of motion;Decreased activity tolerance;Decreased balance;Decreased mobility;Decreased  knowledge of use of DME;Cardiopulmonary status limiting activity;Decreased skin integrity;Obesity;Pain       PT Treatment Interventions DME instruction;Gait training;Stair training;Functional mobility training;Therapeutic activities;Therapeutic exercise;Balance training;Neuromuscular re-education;Patient/family education;Modalities    PT Goals (Current goals can be found in the Care Plan section)  Acute Rehab PT Goals Patient Stated Goal: Get stronger PT Goal Formulation: With patient Time For Goal Achievement: 02/08/24 Potential to Achieve Goals: Good    Frequency Min 2X/week     Co-evaluation               AM-PAC PT "6 Clicks" Mobility  Outcome Measure Help needed turning from your back to your side while in a flat bed without using bedrails?: A Little Help needed moving from lying on your back to sitting on the side of a flat bed without using bedrails?: A Little Help needed moving to and from a bed to a chair (including a wheelchair)?: A Lot Help needed standing up from a chair using your arms (e.g., wheelchair or bedside chair)?: A Lot Help needed to walk in hospital room?: A Little Help needed climbing 3-5 steps with a railing? : Total 6 Click Score: 14    End of Session Equipment Utilized During Treatment: Gait belt;Oxygen Activity Tolerance: Patient tolerated treatment well (Weakness) Patient left: in chair;with call bell/phone within reach;with nursing/sitter in room;with family/visitor present Nurse Communication: Mobility status;Other (comment) (Rt forarm skin tear) PT Visit Diagnosis: Unsteadiness on feet (R26.81);Other abnormalities of gait and mobility (R26.89);Muscle weakness (generalized) (M62.81);History of  falling (Z91.81);Difficulty in walking, not elsewhere classified (R26.2)    Time: 1610-9604 PT Time Calculation (min) (ACUTE ONLY): 45 min   Charges:   PT Evaluation $PT Eval Moderate Complexity: 1 Mod PT Treatments $Gait Training: 8-22  mins $Therapeutic Activity: 8-22 mins PT General Charges $$ ACUTE PT VISIT: 1 Visit         Jory Ng, PT, DPT [facility]  Rehabilitation Services Physical Therapist Office: [phone] Website: [email or web address]   Alinda Irani 01/25/2024, 11:11 AM

## 2024-01-25 NOTE — Progress Notes (Signed)
 PHARMACY - PHYSICIAN COMMUNICATION CRITICAL VALUE ALERT - BLOOD CULTURE IDENTIFICATION (BCID)  [name] is an 68 y.o. male who presented to [facility] on 01/24/2024 with a chief complaint of SHOB  Assessment:  1/4 BCX with staph epi +mecA  Name of physician (or Provider) Contacted: Deneise Finlay  Current antibiotics: augmentin  Changes to prescribed antibiotics recommended: n/a - likely contaminant   No results found. However, due to the size of the patient record, not all encounters were searched. Please check Results Review for a complete set of results.  Oralee Billow, PharmD, BCCCP Clinical Pharmacist 01/25/2024 10:57 AM

## 2024-01-26 DIAGNOSIS — A419 Sepsis, unspecified organism: Secondary | ICD-10-CM | POA: Diagnosis not present

## 2024-01-26 DIAGNOSIS — I4891 Unspecified atrial fibrillation: Secondary | ICD-10-CM | POA: Diagnosis not present

## 2024-01-26 DIAGNOSIS — A09 Infectious gastroenteritis and colitis, unspecified: Secondary | ICD-10-CM

## 2024-01-26 DIAGNOSIS — R6521 Severe sepsis with septic shock: Secondary | ICD-10-CM | POA: Diagnosis not present

## 2024-01-26 LAB — COMPREHENSIVE METABOLIC PANEL WITH GFR
ALT: 45 U/L — ABNORMAL HIGH (ref 0–44)
AST: 34 U/L (ref 15–41)
Albumin: 3.1 g/dL — ABNORMAL LOW (ref 3.5–5.0)
Alkaline Phosphatase: 79 U/L (ref 38–126)
Anion gap: 12 (ref 5–15)
BUN: 37 mg/dL — ABNORMAL HIGH (ref 8–23)
CO2: 20 mmol/L — ABNORMAL LOW (ref 22–32)
Calcium: 9.3 mg/dL (ref 8.9–10.3)
Chloride: 102 mmol/L (ref 98–111)
Creatinine, Ser: 1.4 mg/dL — ABNORMAL HIGH (ref 0.61–1.24)
GFR, Estimated: 54 mL/min — ABNORMAL LOW (ref >60)
Glucose, Bld: 123 mg/dL — ABNORMAL HIGH (ref 70–99)
Potassium: 3.8 mmol/L (ref 3.5–5.1)
Sodium: 134 mmol/L — ABNORMAL LOW (ref 135–145)
Total Bilirubin: 0.8 mg/dL (ref 0.0–1.2)
Total Protein: 6.2 g/dL — ABNORMAL LOW (ref 6.5–8.1)

## 2024-01-26 MED ORDER — HYDROXYZINE HCL 25 MG PO TABS
25.0000 mg | ORAL_TABLET | Freq: Three times a day (TID) | ORAL | Status: DC | PRN
  Administered 2024-01-26 (×2): 25 mg via ORAL
  Filled 2024-01-26 (×3): qty 1

## 2024-01-26 MED ORDER — ACETAMINOPHEN 325 MG PO TABS
650.0000 mg | ORAL_TABLET | Freq: Four times a day (QID) | ORAL | Status: DC | PRN
  Administered 2024-01-27 (×3): 650 mg via ORAL
  Filled 2024-01-26 (×3): qty 2

## 2024-01-26 MED ORDER — OXYCODONE HCL 5 MG PO TABS
5.0000 mg | ORAL_TABLET | Freq: Once | ORAL | Status: AC | PRN
  Administered 2024-01-26: 5 mg via ORAL
  Filled 2024-01-26: qty 1

## 2024-01-26 NOTE — Progress Notes (Signed)
 NAME:  [name], MRN:  [id number], DOB:  Mar 31, 1956, LOS: 2 ADMISSION DATE:  01/24/2024, CONSULTATION DATE: 4/11 REFERRING MD: Linder Revere, CHIEF COMPLAINT:  septic shock    History of Present Illness:  68 year old male patient with extensive cardiac and medical history as outlined below.  Ambulates with a cane for last 15 years, actually last seen in our clinic 3 days prior to admission for routine follow-up noting baseline exertional dyspnea felt to be deconditioning and untreated OSA with nocturnal hypoxia superimposed on his underlying lung disease.  Was is in his typical state of health up until this morning on 4/11 when he woke up, and had large-volume liquid diarrhea.  He had trouble getting up from the commode so called EMS because of weakness he subsequently had a witnessed syncopal event by EMS systolic blood pressure in the 50s to 60s.  Has had no nausea or vomiting, no change in stool color, no blood, no fevers, no recent food intake concerning for food poisoning, his abdomen is tender to palpation only.  Denies shortness of breath beyond baseline.  Weakness is noted above.  Was seen initially by emergency room staff and treated with IV fluid resuscitation, he was also started on empiric antibiotics, CT of head was obtained given his syncopal event this was negative for acute injury.  After receiving 2 L of crystalloid resuscitation effort he remained hypotensive with systolic blood pressure in the 70s and because of this he was started on a norepinephrine infusion and pulmonary critical care was asked to evaluate  Pertinent  Medical History  Former smoker, childhood asthma, COPD, obstructive sleep apnea not on CPAP, type 2 diabetes, hypertension, atrial fibrillation on Eliquis, nonischemic cardiomyopathy status post AICD, with EF 40% and global hypokinesis, chronic diastolic heart failure, GERD  Significant Hospital Events: Including procedures, antibiotic start and stop dates in addition to  other pertinent events   4/11 admitted with diarrhea, dehydration, syncopal event and hypovolemic versus septic shock.  He is on chronic steroids and receives intermittent steroid injections.  Started on broad-spectrum antibiotics, norepinephrine, stress dose steroids, and vasopressin 4/12 vasopressors weaned off overnight 4/13 AKI continues to improve   Interim History / Subjective:  Seen sitting up in bedside recliner with reports of generalized pain   Objective   Blood pressure 109/65, pulse 70, temperature 98.5 F (36.9 C), temperature source Oral, resp. rate (!) 24, height 5\' 10"  (1.778 m), weight 124.4 kg, SpO2 100%.        Intake/Output Summary (Last 24 hours) at 01/26/2024 1207 Last data filed at 01/26/2024 1000 Gross per 24 hour  Intake 540 ml  Output 2285 ml  Net -1745 ml   Filed Weights   01/25/24 0500 01/25/24 1641 01/26/24 0500  Weight: 122.5 kg 124.3 kg 124.4 kg    Examination: General: Acute on chronically ill appearing elderly male lying in bed, in NAD HEENT: [place]/AT, MM pink/moist, PERRL,  Neuro: Alert and oriented x2, non-focal  CV: s1s2 regular rate and rhythm, no murmur, rubs, or gallops,  PULM:  Clear to auscultation, no increased work of breathing, no added breath sounds,  GI: soft, bowel sounds active in all 4 quadrants, non-tender, non-distended, tolerating oral diet Extremities: warm/dry, no edema  Skin: no rashes or lesions  Resolved Hospital Problem list   Hypovolemic and septic shock with lactic acidosis secondary to what is likely a viral gastro enteritis, complicated by diarrhea, volume depletion, and relative adrenal insufficiency.  -Note he is also on chronic Dupilumab managed by  derm so increased risk of immunosuppression   Assessment & Plan:  Viral gastroenteritis P: Continue p.o. Augmentin x 5 days Continue home prednisone  HFrEF Nonischemic cardiomyopathy  -Echocardiogram 10/2023 with EF of 40 to 45%  P: Continuous telemetry Continue  all home antihypertensives Remains on home Eliquis strict intake and output Daily weight Optimize electrolytes  Atrial fibrillation anticoagulated on baseline with Eliquis P: Continuous telemetry Eliquis as above  AKI -improving -Creatinine on admission 2.48 with GFR 27 compared to creatinine 1.18 with GFR 63 March 2025 P: Continue to follow renal function Monitor output Trend Bement Avoid nephrotoxins Home diuretics remain on hold  COPD, with chronic dyspnea Untreated sleep apnea P: Supplemental oxygen as needed for sat greater than 90 Triple neb therapy Aspiration precautions Mobilize as able  Remote cervical spine fracture, involving C3-C4 with contraction and chronic pain of the neck P: Multimodal pain control  Stable for transfer out of ICU and to hospitalist service starting 4/14  Best Practice (right click and "Reselect all SmartList Selections" daily)   Diet/type: NPO DVT prophylaxis DOAC Pressure ulcer(s): present on admission  left LE skin tear  GI prophylaxis: PPI Lines: N/A Foley:  N/A Code Status:  full code Last date of multidisciplinary goals of care discussion [pending]  Critical care time: NA   Aman Batley D. Harris, NP-C [facility] Personal contact information can be found on Am[employer]  If no contact or response made please call [phone] 01/26/2024, 12:07 PM

## 2024-01-26 NOTE — Plan of Care (Signed)
  Problem: Education: Goal: Knowledge of General Education information will improve Description: Including pain rating scale, medication(s)/side effects and non-pharmacologic comfort measures Outcome: Progressing   Problem: Elimination: Goal: Will not experience complications related to bowel motility Outcome: Progressing Goal: Will not experience complications related to urinary retention Outcome: Progressing   Problem: Pain Managment: Goal: General experience of comfort will improve and/or be controlled Outcome: Not Progressing   Problem: Skin Integrity: Goal: Risk for impaired skin integrity will decrease Outcome: Not Progressing

## 2024-01-26 NOTE — Progress Notes (Signed)
 eLink Physician-Brief Progress Note Patient Name: [name] DOB: 08/23/1956 MRN: [id number]   Date of Service  01/26/2024  HPI/Events of Note  Patient after management of septic shock no complains of itching  eICU Interventions  Add hydroxyzine as needed     Intervention Category Minor Interventions: Routine modifications to care plan (e.g. PRN medications for pain, fever)  Jaquesha Boroff 01/26/2024, 2:30 AM

## 2024-01-26 NOTE — Evaluation (Signed)
 Occupational Therapy Evaluation Patient Details Name: [name] MRN: [id number] DOB: 1956-09-01 Today's Date: 01/26/2024   History of Present Illness   68 year old male presenting 4/11 with new onset diarrhea, weakness, syncope, sepsis. PMHx: COPD, asthma overlap syndrome, OSA, diabetes, hypertension, A-fib, cardiomyopathy, torticollis, cervical fx in August 2024     Clinical Impressions At baseline, pt is Independent to Mod with ADLs and performs functional mobility Mod I with a SPC. At baseline, pt receives assistance from his girlfriend for meal prep, transportation, and other home management tasks as needed. Pt now presents with decreased activity tolerance, impaired cardiopulmonary status, decreased B shoulder strength, decreased L UE gross motor coordination, decreased balance, and decreased safety and independence with functional tasks. Pt currently demonstrates ability to complete ADLs with Set up to Mod assist and functional transfers/mobility in room with a RW with Contact guard to Min assist. Pt HR stable throughout session. Pt on 2L continuous O2 through [place] upon arrival. O2 removed during session with pt O2 stable at >/93% on RA with activity in sitting. However, with standing/stepping pt with quick O2 desat to 80% recovering to 86% with pursed lip breathing on RA. Pt placed back on 2L continuous O2 through [place] with O2 sat recovering to >/96% throughout remainder of session. Pt participated well in session, is motivated to return to PLOF, and reports good support from girlfriend.  Pt will benefit from acute skilled OT services to address deficits outlined below and increase safety and independence with functional tasks. Pt expected to make good progress toward OT goals. Post acute discharge, pt will benefit from [facility] OT to maximize rehab potential.      If plan is discharge home, recommend the following:   A little help with walking and/or transfers;A lot of help with  bathing/dressing/bathroom;Assistance with cooking/housework;Assist for transportation;Help with stairs or ramp for entrance     Functional Status Assessment   Patient has had a recent decline in their functional status and demonstrates the ability to make significant improvements in function in a reasonable and predictable amount of time.     Equipment Recommendations   Other (comment) (OT recommends BSC/3in1. However, pt declines at this time due to limited space in his home.)     Recommendations for Other Services         Precautions/Restrictions   Precautions Precautions: Fall Recall of Precautions/Restrictions: Intact Precaution/Restrictions Comments: monitor BP and O2 Restrictions Weight Bearing Restrictions Per Provider Order: No     Mobility Bed Mobility Overal bed mobility: Needs Assistance             General bed mobility comments: Pt sitting in recliner at beginning and end of session    Transfers Overall transfer level: Needs assistance Equipment used: Rolling walker (2 wheels) Transfers: Sit to/from Stand, Bed to chair/wheelchair/BSC Sit to Stand: Min assist     Step pivot transfers: Contact guard assist, Min assist     General transfer comment: Min assist to power up then CGA once in standing      Balance Overall balance assessment: Needs assistance Sitting-balance support: No upper extremity supported, Feet supported Sitting balance-Leahy Scale: Fair     Standing balance support: Single extremity supported, Bilateral upper extremity supported, During functional activity, Reliant on assistive device for balance Standing balance-Leahy Scale: Poor Standing balance comment: Requires CGA and unilateral UE support to maintain balance in dynamic standing during functional tasks  ADL either performed or assessed with clinical judgement   ADL Overall ADL's : Needs assistance/impaired Eating/Feeding: Set  up;Sitting   Grooming: Set up;Sitting   Upper Body Bathing: Minimal assistance;Sitting;Cueing for compensatory techniques   Lower Body Bathing: Minimal assistance;Moderate assistance;Sit to/from stand;Cueing for compensatory techniques   Upper Body Dressing : Minimal assistance;Sitting   Lower Body Dressing: Minimal assistance;Sitting/lateral leans;Sit to/from stand   Toilet Transfer: Contact guard assist;Minimal assistance;Rolling walker (2 wheels);BSC/3in1 (step-pivot transfer) Toilet Transfer Details (indicate cue type and reason): Min assist to power up then CGA once in standing Toileting- Clothing Manipulation and Hygiene: Minimal assistance;Sitting/lateral lean       Functional mobility during ADLs: Contact guard assist;Minimal assistance;Rolling walker (2 wheels) General ADL Comments: Pt with decreased activity tolerance, fatiguing quickly during tasks. Pt requires increased time and effort with ADLs.     Vision Baseline Vision/History: 1 Wears glasses Ability to See in Adequate Light: 0 Adequate (with glasses) Patient Visual Report: No change from baseline       Perception         Praxis         Pertinent Vitals/Pain Pain Assessment Pain Assessment: Faces Faces Pain Scale: Hurts little more Pain Location: back -chronic; L shoulder with AROM Pain Descriptors / Indicators: Aching, Sore, Grimacing Pain Intervention(s): Limited activity within patient's tolerance, Monitored during session, Repositioned     Extremity/Trunk Assessment Upper Extremity Assessment Upper Extremity Assessment: Left hand dominant;RUE deficits/detail;LUE deficits/detail RUE Deficits / Details: gross shoulder strength 4-/5; gross strength otherwise 4+ to 5/5; ROM, coordinaiton, and sensation WFL RUE Coordination: WNL LUE Deficits / Details: hand strength 5/5, otherwise gross strength 3 to 4-/5; decreased shoulder AROM/PROM to approx 90 degrees at baseline, AROM otherwise WFL; pt reporting  increased pain in shoulder with AROM this day; decreased gross motor coordinaiton; fine motor coordination and sensation WFL LUE Coordination: decreased gross motor   Lower Extremity Assessment Lower Extremity Assessment: Defer to PT evaluation   Cervical / Trunk Assessment Cervical / Trunk Assessment: Other exceptions Cervical / Trunk Exceptions: cervical fx in August 2024; torticollis since sustaining cervical fx   Communication Communication Communication: No apparent difficulties   Cognition Arousal: Alert Behavior During Therapy: WFL for tasks assessed/performed Cognition: No apparent impairments             OT - Cognition Comments: PT AAOx4 and pleasant throughout session. Pt cognition WFL for tasks assessed. Cogntiion not formally screened or assessed.                 Following commands: Intact       Cueing  General Comments   Cueing Techniques: Verbal cues  Pt HR in the 70s to 80s throughout session. Pt on 2L O2 through [place] upon OT arrival. O2 removed during session with pt O2 stable at >/93% on RA with activity in sitting. However, with standing/stepping pt with quick O2 desat to 80% recovering to 86% with pursed lip breathing on RA. Pt placed back on 2L continuous O2 through [place] with O2 sat recovering to >/96% throughuot remainder of session. NT entering room at end of session.   Exercises     Shoulder Instructions      Home Living Family/patient expects to be discharged to:: Private residence Living Arrangements: Spouse/significant other (girlfriend) Available Help at Discharge: Family;Available 24 hours/day Type of Home: House Home Access: Stairs to enter [employer] of Steps: 1 Entrance Stairs-Rails: Right Home Layout: One level     Bathroom Shower/Tub: Walk-in shower  Bathroom Toilet: Standard Bathroom Accessibility: Yes How Accessible: Accessible via walker Home Equipment: Pharmacist, hospital (2 wheels);Cane - single  point;Grab bars - tub/shower;Hand held shower head   Additional Comments: Pt reports he typically sleeps in his recliner.      Prior Functioning/Environment Prior Level of Function : Independent/Modified Independent;History of Falls (last six months);Needs assist             Mobility Comments: Uses SPC ADLs Comments: Independent to Mod I with ADLs. Girlfriend assists with cooking, other home management tasks as needed, and transportation. Pt is a Surveyor, minerals and retired Education administrator.    OT Problem List: Decreased strength;Decreased activity tolerance;Impaired balance (sitting and/or standing);Cardiopulmonary status limiting activity   OT Treatment/Interventions: Self-care/ADL training;Therapeutic exercise;Energy conservation;DME and/or AE instruction;Therapeutic activities;Patient/family education;Balance training      OT Goals(Current goals can be found in the care plan section)   Acute Rehab OT Goals Patient Stated Goal: to return home and  feel better OT Goal Formulation: With patient Time For Goal Achievement: 02/09/24 Potential to Achieve Goals: Good ADL Goals Pt Will Perform Lower Body Bathing: with supervision;sitting/lateral leans;sit to/from stand (with adaptive equipment as needed) Pt Will Perform Upper Body Dressing: with modified independence;sitting Pt Will Perform Lower Body Dressing: with supervision;sit to/from stand;sitting/lateral leans Pt Will Transfer to Toilet: with supervision;ambulating;regular height toilet;grab bars (with least restrictive AD) Pt Will Perform Toileting - Clothing Manipulation and hygiene: with supervision;sitting/lateral leans;sit to/from stand Additional ADL Goal #1: Patient will demonstrate ability to Independently state 3 energy conservation strategies to increase safety and independence with functional tasks with handout provided. Additional ADL Goal #2: Patient will demonstrate understanding through teach back of education in fall prevention  strategies, including monitoring O2 sat and BP in the home, to increase safety and independence with functional tasks and improve management of chronic health conditions.   OT Frequency:  Min 2X/week    Co-evaluation              AM-PAC OT "6 Clicks" Daily Activity     Outcome Measure Help from another person eating meals?: A Little Help from another person taking care of personal grooming?: A Little Help from another person toileting, which includes using toliet, bedpan, or urinal?: A Little Help from another person bathing (including washing, rinsing, drying)?: A Lot Help from another person to put on and taking off regular upper body clothing?: A Little Help from another person to put on and taking off regular lower body clothing?: A Little 6 Click Score: 17   End of Session Equipment Utilized During Treatment: Oxygen;Rolling walker (2 wheels);Gait belt Nurse Communication: Mobility status  Activity Tolerance: Patient tolerated treatment well Patient left: in chair;with call bell/phone within reach;with chair alarm set;with nursing/sitter in room  OT Visit Diagnosis: Unsteadiness on feet (R26.81);Other abnormalities of gait and mobility (R26.89);Muscle weakness (generalized) (M62.81);Other (comment) (decreased activity tolerance)                Time: 4098-1191 OT Time Calculation (min): 23 min Charges:  OT General Charges $OT Visit: 1 Visit OT Evaluation $OT Eval Low Complexity: 1 Low OT Treatments $Self Care/Home Management : 8-22 mins  Lexi Conaty "Darral Ellis., OTR/L, MA Acute Rehab [phone]   Walt Gunner 01/26/2024, 5:01 PM

## 2024-01-26 NOTE — Plan of Care (Signed)

## 2024-01-26 NOTE — Progress Notes (Signed)
 The patient is complaining of itching in his upper torso and he's requesting for something for it. Notified the PCCM RN on call and she said sh would convey the message to the provider.

## 2024-01-27 ENCOUNTER — Other Ambulatory Visit (HOSPITAL_COMMUNITY): Payer: Self-pay

## 2024-01-27 ENCOUNTER — Other Ambulatory Visit (HOSPITAL_BASED_OUTPATIENT_CLINIC_OR_DEPARTMENT_OTHER): Payer: Self-pay

## 2024-01-27 ENCOUNTER — Telehealth: Payer: Self-pay

## 2024-01-27 DIAGNOSIS — I5020 Unspecified systolic (congestive) heart failure: Secondary | ICD-10-CM | POA: Diagnosis not present

## 2024-01-27 DIAGNOSIS — R6521 Severe sepsis with septic shock: Secondary | ICD-10-CM | POA: Diagnosis not present

## 2024-01-27 DIAGNOSIS — A419 Sepsis, unspecified organism: Secondary | ICD-10-CM | POA: Diagnosis not present

## 2024-01-27 DIAGNOSIS — A09 Infectious gastroenteritis and colitis, unspecified: Secondary | ICD-10-CM | POA: Diagnosis not present

## 2024-01-27 LAB — CBC WITH DIFFERENTIAL/PLATELET
Abs Immature Granulocytes: 0.25 10*3/uL — ABNORMAL HIGH (ref 0.00–0.07)
Basophils Absolute: 0 10*3/uL (ref 0.0–0.1)
Basophils Relative: 0 %
Eosinophils Absolute: 0.1 10*3/uL (ref 0.0–0.5)
Eosinophils Relative: 1 %
HCT: 32.7 % — ABNORMAL LOW (ref 39.0–52.0)
Hemoglobin: 10.3 g/dL — ABNORMAL LOW (ref 13.0–17.0)
Immature Granulocytes: 2 %
Lymphocytes Relative: 7 %
Lymphs Abs: 1 10*3/uL (ref 0.7–4.0)
MCH: 29.8 pg (ref 26.0–34.0)
MCHC: 31.5 g/dL (ref 30.0–36.0)
MCV: 94.5 fL (ref 80.0–100.0)
Monocytes Absolute: 1.3 10*3/uL — ABNORMAL HIGH (ref 0.1–1.0)
Monocytes Relative: 10 %
Neutro Abs: 10.4 10*3/uL — ABNORMAL HIGH (ref 1.7–7.7)
Neutrophils Relative %: 80 %
Platelets: 232 10*3/uL (ref 150–400)
RBC: 3.46 MIL/uL — ABNORMAL LOW (ref 4.22–5.81)
RDW: 14.3 % (ref 11.5–15.5)
WBC: 13 10*3/uL — ABNORMAL HIGH (ref 4.0–10.5)
nRBC: 0 % (ref 0.0–0.2)

## 2024-01-27 LAB — COMPREHENSIVE METABOLIC PANEL WITH GFR
ALT: 43 U/L (ref 0–44)
AST: 30 U/L (ref 15–41)
Albumin: 3 g/dL — ABNORMAL LOW (ref 3.5–5.0)
Alkaline Phosphatase: 67 U/L (ref 38–126)
Anion gap: 7 (ref 5–15)
BUN: 26 mg/dL — ABNORMAL HIGH (ref 8–23)
CO2: 25 mmol/L (ref 22–32)
Calcium: 9.4 mg/dL (ref 8.9–10.3)
Chloride: 104 mmol/L (ref 98–111)
Creatinine, Ser: 0.86 mg/dL (ref 0.61–1.24)
GFR, Estimated: >60 mL/min (ref >60)
Glucose, Bld: 102 mg/dL — ABNORMAL HIGH (ref 70–99)
Potassium: 4.4 mmol/L (ref 3.5–5.1)
Sodium: 136 mmol/L (ref 135–145)
Total Bilirubin: 0.7 mg/dL (ref 0.0–1.2)
Total Protein: 6.2 g/dL — ABNORMAL LOW (ref 6.5–8.1)

## 2024-01-27 LAB — MAGNESIUM: Magnesium: 2.2 mg/dL (ref 1.7–2.4)

## 2024-01-27 LAB — CULTURE, BLOOD (ROUTINE X 2): Special Requests: ADEQUATE

## 2024-01-27 MED ORDER — AMOXICILLIN-POT CLAVULANATE 875-125 MG PO TABS
1.0000 | ORAL_TABLET | Freq: Two times a day (BID) | ORAL | 0 refills | Status: AC
Start: 2024-01-27 — End: 2024-01-29
  Filled 2024-01-27: qty 4, 2d supply, fill #0

## 2024-01-27 MED ORDER — ARFORMOTEROL TARTRATE 15 MCG/2ML IN NEBU
15.0000 ug | INHALATION_SOLUTION | Freq: Two times a day (BID) | RESPIRATORY_TRACT | 3 refills | Status: DC
  Filled 2024-01-27: qty 120, 30d supply, fill #0

## 2024-01-27 MED ORDER — SPIRIVA RESPIMAT 2.5 MCG/ACT IN AERS
2.0000 | INHALATION_SPRAY | Freq: Every day | RESPIRATORY_TRACT | 3 refills | Status: DC
  Filled 2024-01-27: qty 4, 30d supply, fill #0

## 2024-01-27 NOTE — Progress Notes (Signed)
 2 attempts made to give pt morning breathing treatments.

## 2024-01-27 NOTE — Progress Notes (Signed)
 Nurse requested Mobility Specialist to perform oxygen saturation test with pt which includes removing pt from oxygen both at rest and while ambulating.  Below are the results from that testing.     Patient Saturations on Room Air at Rest = spO2 94%  Patient Saturations on Room Air while Ambulating = sp02 88% .  Patient Saturations on 2 Liters of oxygen while Ambulating = sp02 95%  At end of testing pt left in room on 2  Liters of oxygen.  Reported results to nurse.   Oneda Big Mobility Specialist Please contact via SecureChat or  Rehab office at [phone]

## 2024-01-27 NOTE — Telephone Encounter (Signed)
 If patient qualifies for oxygen as an inpatient then hospital staffing will arrange for this.  For now please schedule for outpatient follow-up in 4-6 weeks for hospital follow-up at [facility] with NP

## 2024-01-27 NOTE — TOC Transition Note (Addendum)
 Transition of Care [facility]) - Discharge Note   Patient Details  Name: [name] MRN: [id number] Date of Birth: 1956/01/01  Transition of Care [facility]) CM/SW Contact:  Ronni Colace, RN Phone Number: 01/27/2024, 1:48 PM   Clinical Narrative:     Spoke to patient at bedside. Sarah NP at bedside as well discussed home health and probable oxygen need, Nursing will need to do ambulation /walking saturations and DC central line She will place [facility] orders Patient would like a company who is highly rated and works well with his insurance. [employer] called and accepted, they have zero copay for [employer]. Patient inquired about pain medication, he will have to get that from his PCP or whomever normally orders it. He has not been wearing CPAP at home, most likely will need a sleep study. He has called in to pulmonary and they are making an appointment for him for hospital follow up. No DME needed, he has a walker, new renovated shower with hand grips seat. Awaiting walking test to order oxygen.  1440 oxygen ordered, went through adapt as patient has [facility].  Final next level of care: Home w Home Health Services Barriers to Discharge: No Barriers Identified   Patient Goals and CMS Choice Patient states their goals for this hospitalization and ongoing recovery are:: To go home          Discharge Placement                       Discharge Plan and Services Additional resources added to the After Visit Summary for   In-house Referral: Clinical Social Work                        HH Arranged: PT, OT HH Agency: [facility] Date [facility] Agency Contacted: 01/27/24 Time HH Agency Contacted: 1348 Representative spoke with at [facility] Agency: Randel Buss  Social Drivers of Health (SDOH) Interventions SDOH Screenings   Food Insecurity: No Food Insecurity (01/01/2024)  Housing: High Risk (01/01/2024)  Transportation Needs: No Transportation Needs (01/01/2024)  Utilities: Not At Risk  (09/16/2023)  Alcohol Screen: Low Risk  (09/16/2023)  Depression (PHQ2-9): Low Risk  (01/03/2024)  Financial Resource Strain: Medium Risk (01/01/2024)  Physical Activity: Inactive (01/01/2024)  Social Connections: Socially Isolated (01/01/2024)  Stress: No Stress Concern Present (01/01/2024)  Tobacco Use: Medium Risk (01/24/2024)     Readmission Risk Interventions    11/22/2022    1:34 PM  Readmission Risk Prevention Plan  Post Dischage Appt Complete  Medication Screening Complete  Transportation Screening Complete

## 2024-01-27 NOTE — TOC Initial Note (Signed)
 Transition of Care [facility]) - Initial/Assessment Note    Patient Details  Name: [name] MRN: [id number] Date of Birth: 1955-10-24  [facility]) CM/SW Contact:    Arron Big, LCSWA Phone Number: 01/27/2024, 11:05 AM  Clinical Narrative:   CSW introduced self/role to patient. CSW discussed PT recs for CIR and asked if patient would be interested in SNF as a backup. Patient stated he would like to go home with [facility] as he has upcoming appointments. Patient stated he lives with his girlfriend and will need home O2 at discharge. CSW notified RNCM.                TOC will continue to follow.    Expected Discharge Plan: Home w Home Health Services Barriers to Discharge: Continued Medical Work up   Patient Goals and CMS Choice Patient states their goals for this hospitalization and ongoing recovery are:: To go home          Expected Discharge Plan and Services In-house Referral: Clinical Social Work     Living arrangements for the past 2 months: Single Family Home                                      Prior Living Arrangements/Services Living arrangements for the past 2 months: Single Family Home Lives with:: Significant Other Patient language and need for interpreter reviewed:: Yes Do you feel safe going back to the place where you live?: Yes      Need for Family Participation in Patient Care: No (Comment) Care giver support system in place?: Yes (comment)   Criminal Activity/Legal Involvement Pertinent to Current Situation/Hospitalization: No - Comment as needed  Activities of Daily Living      Permission Sought/Granted   Permission granted to share information with : No              Emotional Assessment Appearance:: Appears stated age Attitude/Demeanor/Rapport: Engaged Affect (typically observed): Pleasant Orientation: : Oriented to Self, Oriented to Place, Oriented to  Time, Oriented to Situation Alcohol / Substance Use: Not  Applicable Psych Involvement: No (comment)  Admission diagnosis:  Septic shock (HCC) [A41.9, R65.21] Syncope, unspecified syncope type [R55] Sepsis with acute renal failure and septic shock, due to unspecified organism, unspecified acute renal failure type (HCC) [A41.9, R65.21, N17.9] Patient Active Problem List   Diagnosis Date Noted   Septic shock (HCC) 01/24/2024   Dysphagia 01/03/2024   Incontinence of feces 01/03/2024   Cervical dystonia 08/14/2023   C3 cervical fracture (HCC) 07/01/2023   Neck pain 06/13/2023   Leg wound, left 05/25/2023   Closed C7 fracture without spinal cord injury (HCC) 05/14/2023   Shingles 03/03/2023   Epigastric pain 01/16/2023   Nausea and vomiting 01/16/2023   Cardiac arrhythmia 01/16/2023   Cellulitis and abscess of leg, except foot 11/21/2022   Cellulitis of right lower extremity without foot 11/20/2022   Anxiety and depression 05/20/2022   AC (acromioclavicular) arthritis 04/24/2022   Cataract, nuclear sclerotic, left eye 03/07/2022   ED (erectile dysfunction) 01/03/2022   Intertrigo 12/24/2021   Gluteal tendinitis of left buttock 12/19/2021   Unilateral primary osteoarthritis, left knee 10/17/2021   Status post total knee replacement, left 10/17/2021   Rib pain on right side 10/10/2021   History of colonic polyps 09/26/2021   Bursitis of left hip 06/13/2021   Tooth fracture 06/11/2021   Pain and swelling of  right lower leg 05/18/2021   Gastrointestinal hemorrhage    Diverticulitis of colon    Preop exam for internal medicine 03/04/2021   B12 deficiency 03/04/2021   Vitamin D deficiency 03/01/2021   Myalgia 12/01/2020   Laceration of left leg 09/17/2020   Statin myopathy 08/02/2020   Rotator cuff arthropathy of both shoulders 02/23/2020   ICD (implantable cardioverter-defibrillator) in place 12/27/2019   Elevated PSA 09/23/2019   Lip lesion 12/23/2018   Diabetic ulcer of left lower leg with necrosis of muscle (HCC) 12/05/2018    Chronic bursitis of left shoulder 07/01/2018   Pain due to total knee replacement (HCC) 03/31/2018   Rotator cuff arthropathy, left 02/13/2018   Right rotator cuff tear arthropathy 01/14/2018   Increased prostate specific antigen (PSA) velocity 09/26/2017   HFrEF (heart failure with reduced ejection fraction) (HCC) 09/18/2017   Trigger point of thoracic region 07/04/2017   Right lumbar radiculopathy 05/15/2017   Right leg swelling 05/09/2017   Glaucoma 04/18/2017   History of nasal polyposis 02/19/2017   Chronic rhinitis 02/19/2017   Microhematuria 12/04/2016   Hyperlipidemia 08/15/2016   Encounter for well adult exam with abnormal findings 11/29/2015   Syncope 03/30/2015   Former tobacco use 02/25/2015   Type 2 diabetes mellitus (HCC) 02/25/2015   Permanent atrial fibrillation (HCC) 02/25/2015   Anemia 02/23/2015   Thyrotoxicosis 02/23/2015   Hx of adenomatous colonic polyps 10/21/2014   De Quervain's tenosynovitis, right 04/30/2014   Osteoarthritis of right knee 02/19/2014   Encounter for therapeutic drug monitoring 11/17/2013   Thoracic degenerative disc disease 06/17/2013   Thoracic spondylosis without myelopathy 02/11/2013   COPD (chronic obstructive pulmonary disease) (HCC) 08/20/2012   Bundle branch block, right and extreme left anterior fascicular 05/05/2012   Nonischemic cardiomyopathy (HCC) 05/02/2012   Cardiomyopathy (HCC)    Spongiotic dermatitis 09/26/2011   Urethral stricture 12/26/2010   Chronic pain syndrome 05/18/2010   OSA (obstructive sleep apnea) 10/21/2007   NEPHROLITHIASIS, HX OF 10/21/2007   GERD (gastroesophageal reflux disease) 06/09/2007   Benign prostatic hyperplasia 06/09/2007   Morbid obesity (HCC) 06/03/2007   Depression 06/03/2007   INSOMNIA 06/03/2007   PCP:  Roslyn Coombe, MD Pharmacy:   [facility] - [place], [place] - [address] [place] [address] Phone: [phone] Fax:  [phone]  [id number] - [place], [place] - [address] [place] [address] Phone: [phone] Fax: [phone]     Social Drivers of Health (SDOH) Social History: SDOH Screenings   Food Insecurity: No Food Insecurity (01/01/2024)  Housing: High Risk (01/01/2024)  Transportation Needs: No Transportation Needs (01/01/2024)  Utilities: Not At Risk (09/16/2023)  Alcohol Screen: Low Risk  (09/16/2023)  Depression (PHQ2-9): Low Risk  (01/03/2024)  Financial Resource Strain: Medium Risk (01/01/2024)  Physical Activity: Inactive (01/01/2024)  Social Connections: Socially Isolated (01/01/2024)  Stress: No Stress Concern Present (01/01/2024)  Tobacco Use: Medium Risk (01/24/2024)   SDOH Interventions:     Readmission Risk Interventions    11/22/2022    1:34 PM  Readmission Risk Prevention Plan  Post Dischage Appt Complete  Medication Screening Complete  Transportation Screening Complete

## 2024-01-27 NOTE — Discharge Summary (Signed)
 Physician Discharge Summary         Patient ID: [name] MRN: [id number] DOB/AGE: 1955/11/08 68 y.o.  Admit date: 01/24/2024 Discharge date: 01/27/2024  Discharge Diagnoses:    Active Hospital Problems   Diagnosis Date Noted   Septic shock (HCC) 01/24/2024    Resolved Hospital Problems  No resolved problems to display.      Discharge summary   Pt. Was admitted 01/24/2024 with hypovolemic/ septic shock likely a viral gastro enteritis, complicated by diarrhea, volume depletion, and relative adrenal insufficiency in setting of Dupilmab by dermatology, so immunocompromised patient. He was started on Augmentin, fluid resuscitated, diuretics were held 2/2 AKI.He has  required supplemental oxygen , and was treated with triple neb therapy. He has improved and is ready for discharge home. He remains on oxygen at 2 L, so he will have an ambulatory oxygen saturation walk to document need so the oxygen can be ordered through home health. He has a nebulizer device at home for nebs..   Discharge Plan by Active Problems   Viral gastroenteritis P: Continue p.o. Augmentin for additional 2 days for total x 5 days Continue  prednisone 10 mg daily in am per orthopedics   HFrEF Nonischemic cardiomyopathy  -Echocardiogram 10/2023 with EF of 40 to 45%  P: Continue all home antihypertensives Continue  home Eliquis strict  Daily weight Hospital follow up with cardiology Continue Torsemide per cardiology Will need follow up with PCP within a week of discharge with labs  Will need pulmonary Hospital follow up within 4-6 weeks of discharge   Atrial fibrillation anticoagulated on baseline with Eliquis P: Continue Eliquis  Follow up with Cardiology   AKI -improving -Creatinine on admission 2.48 with GFR 27 compared to creatinine 1.18 with GFR 63 March 2025 Creatinine at discharge is 0.86 P: Follow up BMET per PCP within 1 week of discharge Avoid nephrotoxins    COPD, with chronic  dyspnea Untreated sleep apnea P: Supplemental oxygen as needed for sat greater than 90 Ambulatory saturation walk to qualify for oxygen Triple neb therapy Aspiration precautions Mobilize as able, home PT/OT ordered   Remote cervical spine fracture, involving C3-C4 with contraction and chronic pain of the neck P: Multimodal pain control Per PCP to order for home       Significant Hospital tests/ studies  CXR 01/25/2024 Worsening retrocardiac aeration. Chronic cardiomegaly with biventricular pacer. Left IJ line with tip at the SVC. Blunting of the costophrenic sulci but no pleural fluid on CT yesterday.   IMPRESSION: Worsening aeration at the left base where there was atelectasis by abdominal CT yesterday. Procedures   None Culture data/antimicrobials   Blood Cx positive for Staph epidermidis, suspected contaminant. Not treated   Consults  Pulmonary Critical Care    Discharge Exam: BP 133/74 (BP Location: Left Arm)   Pulse 67   Temp 98.4 F (36.9 C) (Oral)   Resp 18   Ht 5\' 10"  (1.778 m)   Wt 124.5 kg   SpO2 99%   BMI 39.39 kg/m   General:Pleasant affect, NAD, wearing oxygen at 2 L Skin:Warm and dry, brisk capillary refill HEENT:normocephalic, sclera clear, mucus membranes moist Neck:supple, no JVD, no bruits , LIJ CVC Heart:S1S2 RRR without murmur, gallup, rub or click Lungs:Bilateral chest excursion, clear upper lobes, diminished per bases, few wheezes WUJ:WJXB, non tender, + BS, do not palpate liver spleen or masses, Body mass index is 39.39 kg/m.  Ext:1+  lower ext edema, 2+ pedal pulses, 2+ radial pulses Neuro:alert and  oriented X 3, MAE, follows commands, + facial symmetry  Labs at discharge   Lab Results  Component Value Date   CREATININE 0.86 01/27/2024   BUN 26 (H) 01/27/2024   NA 136 01/27/2024   K 4.4 01/27/2024   CL 104 01/27/2024   CO2 25 01/27/2024   Lab Results  Component Value Date   WBC 13.0 (H) 01/27/2024   HGB 10.3 (L) 01/27/2024    HCT 32.7 (L) 01/27/2024   MCV 94.5 01/27/2024   PLT 232 01/27/2024   Lab Results  Component Value Date   ALT 43 01/27/2024   AST 30 01/27/2024   ALKPHOS 67 01/27/2024   BILITOT 0.7 01/27/2024   Lab Results  Component Value Date   INR 1.2 01/24/2024   INR 1.0 05/14/2023   INR 1.1 05/08/2021    Current radiological studies    No results found.  Disposition:    Home with oxygen     Allergies as of 01/27/2024       Reactions   Tape Other (See Comments)   SKIN TEARS VERY, VERY EASILY!!!! Use Paper Tape Only, PLEASE!!!   Amiodarone Other (See Comments)   Caused hyperthyroidism   Statins Other (See Comments)   Myalgias   Sulfa Antibiotics Other (See Comments)   Mouth ulcers   Wound Dressing Adhesive Other (See Comments)   Skin Tears Use Paper Tape Only        Medication List     STOP taking these medications    acetaminophen 500 MG tablet Commonly known as: TYLENOL   Full Kit Nebulizer Set Misc       TAKE these medications    albuterol (2.5 MG/3ML) 0.083% nebulizer solution Commonly known as: PROVENTIL Take 3 mLs (2.5 mg total) by nebulization every 6 (six) hours as needed for wheezing or shortness of breath.   albuterol 108 (90 Base) MCG/ACT inhaler Commonly known as: VENTOLIN HFA INHALE 1-2 PUFFS BY MOUTH EVERY 6 HOURS AS NEEDED FOR WHEEZE OR SHORTNESS OF BREATH   ALPRAZolam 0.5 MG tablet Commonly known as: XANAX TAKE 2 TABLETS (1 MG TOTAL) BY MOUTH EVERY DAY AT BEDTIME   ammonium lactate 12 % cream Commonly known as: AMLACTIN Apply 1 Application topically as needed for dry skin.   amoxicillin-clavulanate 875-125 MG tablet Commonly known as: AUGMENTIN Take 1 tablet by mouth every 12 (twelve) hours for 2 days.   apixaban 5 MG Tabs tablet Commonly known as: Eliquis Take 1 tablet (5 mg total) by mouth 2 (two) times daily.   arformoterol 15 MCG/2ML Nebu Commonly known as: BROVANA Take 2 mLs (15 mcg total) by nebulization every 12 (twelve)  hours.   ascorbic acid 500 MG tablet Commonly known as: VITAMIN C Take 500 mg by mouth daily.   augmented betamethasone dipropionate 0.05 % cream Commonly known as: DIPROLENE-AF Apply 1 application  topically daily as needed (to affected areas- for itching).   carisoprodol 350 MG tablet Commonly known as: Soma Take 1 tablet (350 mg total) by mouth 4 (four) times daily.   carvedilol 3.125 MG tablet Commonly known as: COREG Take 3.125 mg by mouth 2 (two) times daily.   clotrimazole-betamethasone cream Commonly known as: LOTRISONE Apply 1 Application topically daily.   doxepin 10 MG capsule Commonly known as: SINEQUAN TAKE 2 CAPSULES BY MOUTH AT BEDTIME AS NEEDED   DULoxetine 60 MG capsule Commonly known as: CYMBALTA TAKE 1 CAPSULE BY MOUTH EVERY DAY   Dupixent 300 MG/2ML Soaj Generic drug: Dupilumab Inject 300 mg  into the skin every 14 (fourteen) days.   fluconazole 100 MG tablet Commonly known as: Diflucan Take 1 tablet (100 mg total) by mouth daily.   fluticasone 50 MCG/ACT nasal spray Commonly known as: FLONASE Place 2 sprays into both nostrils daily as needed for allergies or rhinitis.   fluticasone-salmeterol 250-50 MCG/ACT Aepb Commonly known as: Wixela Inhub Inhale 1 puff into the lungs in the morning and at bedtime.   gabapentin 100 MG capsule Commonly known as: NEURONTIN Take 1 capsule (100 mg total) by mouth 3 (three) times daily.   hydrOXYzine 10 MG tablet Commonly known as: ATARAX TAKE 1 TABLET BY MOUTH 3 TIMES DAILY AS NEEDED FOR ITCHING. Strength: 10 mg   ipratropium-albuterol 0.5-2.5 (3) MG/3ML Soln Commonly known as: DUONEB TAKE 3 ML BY NEBULIZATION EVERY 4 HOURS AS NEEDED (WHEEZING).   ketoconazole 2 % cream Commonly known as: NIZORAL Apply 1 Application topically daily.   losartan 25 MG tablet Commonly known as: COZAAR Take 1 tablet (25 mg total) by mouth daily.   mupirocin ointment 2 % Commonly known as: BACTROBAN Apply 1  Application topically 2 (two) times daily for 14 days. Apply to right 2nd toe and to back of left heel   nystatin powder Commonly known as: MYCOSTATIN/NYSTOP Use as directed twice per day as needed to the affected area   omeprazole 20 MG capsule Commonly known as: PRILOSEC TAKE 1 CAPSULE BY MOUTH TWICE A DAY   potassium chloride SA 20 MEQ tablet Commonly known as: Klor-Con M20 Take 1 tablet (20 mEq total) by mouth daily. TAKE (2 TABLETS) IN THE MORNING What changed: additional instructions   predniSONE 10 MG tablet Commonly known as: DELTASONE TAKE 1 TABLET (10 MG TOTAL) BY MOUTH DAILY WITH BREAKFAST.   Repatha SureClick 140 MG/ML Soaj Generic drug: Evolocumab Inject 140 mg into the skin every 14 (fourteen) days.   revefenacin 175 MCG/3ML nebulizer solution Commonly known as: YUPELRI Take 3 mLs (175 mcg total) by nebulization daily. Start taking on: January 28, 2024   silver sulfADIAZINE 1 % cream Commonly known as: SILVADENE APPLY TOPICALLY TO AFFECTED AREA EVERY DAY   Spiriva Respimat 2.5 MCG/ACT Aers Generic drug: Tiotropium Bromide Monohydrate Inhale 2 puffs into the lungs daily.   tamsulosin 0.4 MG Caps capsule Commonly known as: FLOMAX TAKE 1 CAPSULE BY MOUTH EVERY DAY   tirzepatide 2.5 MG/0.5ML Pen Commonly known as: MOUNJARO Inject 2.5 mg into the skin once a week.   tiZANidine 4 MG tablet Commonly known as: ZANAFLEX Take 4 mg by mouth 3 (three) times daily.   torsemide 20 MG tablet Commonly known as: DEMADEX TAKE 5 TABLETS (100 MG TOTAL) BY MOUTH EVERY MORNING AND 4 TABLETS (80 MG TOTAL) EVERY EVENING.   traMADol 50 MG tablet Commonly known as: ULTRAM Take 1 tablet (50 mg total) by mouth 4 (four) times daily.   traZODone 100 MG tablet Commonly known as: DESYREL Take 200 mg by mouth at bedtime as needed.   Tums 500 MG chewable tablet Generic drug: calcium carbonate Chew 500-2,000 mg by mouth every 4 (four) hours as needed for indigestion or  heartburn.   valACYclovir 1000 MG tablet Commonly known as: Valtrex Take 1 tablet (1,000 mg total) by mouth 3 (three) times daily.   venlafaxine XR 150 MG 24 hr capsule Commonly known as: EFFEXOR-XR TAKE 1 CAPSULE BY MOUTH DAILY WITH BREAKFAST.   Vitamin D3 1000 units Caps Take 1,000 Units by mouth daily.   zolpidem 10 MG tablet Commonly known  as: AMBIEN Take 1 tablet (10 mg total) by mouth at bedtime as needed for sleep.               Durable Medical Equipment  (From admission, onward)           Start     Ordered   01/27/24 1320  For home use only DME oxygen  Once       Question Answer Comment  Length of Need Lifetime   Mode or (Route) Nasal cannula   Liters per Minute 3   Frequency Continuous (stationary and portable oxygen unit needed)   Oxygen conserving device Yes   Oxygen delivery system Gas      01/27/24 1319             Follow-up appointment   1 week from discharge with PCP 4-6 weeks from with Pulmonary Discharge Condition:   Good  Physician Statement:   The Patient was personally examined, the discharge assessment and plan has been personally reviewed and I agree with ACNP Hazelyn Kallen's  assessment and plan. 45  minutes of time have been dedicated to discharge assessment, planning and discharge instructions.   Signed: Raejean Bullock 01/27/2024, 2:34 PM

## 2024-01-27 NOTE — Telephone Encounter (Signed)
 Copied from CRM [id number]. Topic: Clinical - Medical Advice >> Jan 27, 2024  8:07 AM Juliana Ocean wrote: Reason for CRM: pt is in the hospital and will need to go home w/ O2 when he does leave.   Lm for pt.

## 2024-01-27 NOTE — Progress Notes (Addendum)
 Mobility Specialist Progress Note:   01/27/24 1400  Mobility  Activity Ambulated with assistance in hallway  Level of Assistance Contact guard assist, steadying assist  Assistive Device Front wheel walker  Distance Ambulated (ft) 100 ft  Activity Response Tolerated fair  Mobility Referral Yes  Mobility visit 1 Mobility  Mobility Specialist Start Time (ACUTE ONLY) 1400  Mobility Specialist Stop Time (ACUTE ONLY) 1415  Mobility Specialist Time Calculation (min) (ACUTE ONLY) 15 min   Pt agreeable to mobility session. Required only minG assist to ambulate with RW. Desat on on RA with longer distances, requiring supplemental O2. Required x1 seated rest break d/t fatigue and SOB. Pt in BR, Student-RN to assist back.  [name] Mobility Specialist Please contact via SecureChat or  Rehab office at [phone]

## 2024-01-27 NOTE — Telephone Encounter (Signed)
 Can you please schedule?

## 2024-01-27 NOTE — Progress Notes (Signed)
 Nurse requested Mobility Specialist to perform oxygen saturation test with pt which includes removing pt from oxygen both at rest and while ambulating.  Below are the results from that testing.      Patient Saturations on Room Air at Rest = spO2 94%   Patient Saturations on Room Air while Ambulating = sp02 88% .   Patient Saturations on 2 Liters of oxygen while Ambulating = sp02 95%   At end of testing pt left in room on 2  Liters of oxygen.   Reported results to nurse.    Oneda Big Mobility Specialist Please contact via SecureChat or  Rehab office at [phone]    Agree with need for oxygen at 2 L [place] to maintain oxygen saturations > 88% at all times. As documented above, patient meets need for oxygen.   Raejean Bullock, MSN, AGACNP-BC [place] [facility] See Amion for personal pager PCCM on call pager [phone]  01/27/2024 3:09 PM

## 2024-01-28 ENCOUNTER — Telehealth: Payer: Self-pay | Admitting: *Deleted

## 2024-01-28 NOTE — Transitions of Care (Post Inpatient/ED Visit) (Signed)
   01/28/2024  Name: [name] MRN: [id number] DOB: 01/03/1956  Today's TOC FU Call Status: Today's TOC FU Call Status:: Unsuccessful Call (1st Attempt) Unsuccessful Call (1st Attempt) Date: 01/28/24  Attempted to reach the patient regarding the most recent Inpatient/ED visit.  Follow Up Plan: Additional outreach attempts will be made to reach the patient to complete the Transitions of Care (Post Inpatient/ED visit) call.   Una Ganser BSN RN [facility] Health Care Management Coordinator [email or web address] Direct Dial: [phone]  Fax: [phone] Website: [email or web address]

## 2024-01-28 NOTE — Telephone Encounter (Signed)
 Patient has been scheduled. Reminder mailed

## 2024-01-28 NOTE — Transitions of Care (Post Inpatient/ED Visit) (Signed)
 01/28/2024  Name: [name] MRN: [id number] DOB: Jan 03, 1956  Today's TOC FU Call Status: Today's TOC FU Call Status:: Successful TOC FU Call Completed TOC FU Call Complete Date: 01/28/24 Patient's Name and Date of Birth confirmed.  Transition Care Management Follow-up Telephone Call Date of Discharge: 01/27/24 Discharge Facility: [facility]) Type of Discharge: Inpatient Admission Primary Inpatient Discharge Diagnosis:: Septic Shock How have you been since you were released from the hospital?: Better Any questions or concerns?: No  Items Reviewed: Did you receive and understand the discharge instructions provided?: Yes Medications obtained,verified, and reconciled?: Yes (Medications Reviewed) Any new allergies since your discharge?: No Dietary orders reviewed?: No Do you have support at home?: Yes People in Home [RPT]: significant other Name of Support/Comfort Primary Source: Significant other  Medications Reviewed Today: Medications Reviewed Today     Reviewed by Luella Cook, RN (Case Manager) on 01/28/24 at 1543  Med List Status: <None>   Medication Order Taking? Sig Documenting Provider Last Dose Status Informant  albuterol (PROVENTIL) (2.5 MG/3ML) 0.083% nebulizer solution [id number] Yes Take 3 mLs (2.5 mg total) by nebulization every 6 (six) hours as needed for wheezing or shortness of breath. Luciano Cutter, MD Taking Active Pharmacy Records, Other  albuterol [facility]) 108 [id number] Base) MCG/ACT inhaler [id number] Yes INHALE 1-2 PUFFS BY MOUTH EVERY 6 HOURS AS NEEDED FOR WHEEZE OR SHORTNESS OF Kandis Ban, MD Taking Active Pharmacy Records, Other  ALPRAZolam Prudy Feeler) 0.5 MG tablet [id number] Yes TAKE 2 TABLETS (1 MG TOTAL) BY MOUTH EVERY DAY AT BEDTIME [name], MD Taking Active Pharmacy Records, Other  ammonium lactate (AMLACTIN) 12 % cream [id number] Yes Apply 1 Application topically as needed for dry skin. Edwin Cap, DPM Taking Active  Self, Pharmacy Records, Other  amoxicillin-clavulanate (AUGMENTIN) 875-125 MG tablet [id number] Yes Take 1 tablet by mouth every 12 (twelve) hours for 2 days. Bevelyn Ngo, NP Taking Active   apixaban (ELIQUIS) 5 MG TABS tablet [id number] Yes Take 1 tablet (5 mg total) by mouth 2 (two) times daily. Laurey Morale, MD Taking Active Pharmacy Records, Other           Med Note [facility], JULIA M   Fri Jan 24, 2024  4:09 PM) Pt stated that he took his last dose this morning. Could not understand the time he stated.  arformoterol (BROVANA) 15 MCG/2ML NEBU [id number] Yes Take 2 mLs (15 mcg total) by nebulization every 12 (twelve) hours. Bevelyn Ngo, NP Taking Active   ascorbic acid (VITAMIN C) 500 MG tablet [id number] Yes Take 500 mg by mouth daily. [provider] Taking Active Pharmacy Records, Other  augmented betamethasone dipropionate (DIPROLENE-AF) 0.05 % cream [id number] Yes Apply 1 application  topically daily as needed (to affected areas- for itching). [provider] Taking Active Self, Pharmacy Records, Other  carisoprodol (SOMA) 350 MG tablet [id number] Yes Take 1 tablet (350 mg total) by mouth 4 (four) times daily. [name], MD Taking Active Pharmacy Records, Other  carvedilol (COREG) 3.125 MG tablet [id number] Yes Take 3.125 mg by mouth 2 (two) times daily. [provider] Taking Active Pharmacy Records, Other  Cholecalciferol (VITAMIN D3) 1000 units CAPS [id number] Yes Take 1,000 Units by mouth daily. [provider] Taking Active Self, Pharmacy Records, Other  clotrimazole-betamethasone (LOTRISONE) cream [id number] Yes Apply 1 Application topically daily. Avanell Shackleton, NP-C Taking Active Self, Pharmacy Records, Other  doxepin (SINEQUAN) 10 MG capsule [id number] Yes TAKE 2 CAPSULES BY  MOUTH AT BEDTIME AS NEEDED [name], MD Taking Active Pharmacy Records, Other  DULoxetine (CYMBALTA) 60 MG capsule [id number] Yes TAKE 1 CAPSULE BY MOUTH EVERY DAY [name], MD Taking Active Pharmacy Records, Other  Dupilumab (DUPIXENT) 300 MG/2ML SOPN [id number] Yes Inject 300 mg into the skin every 14 (fourteen) days. [provider] Taking Active Self, Pharmacy Records, Other           Med Note Suezanne Cheshire   Tue Mar 12, 2023  1:41 PM)    Evolocumab [facility]) 140 MG/ML Ivory Broad [id number] Yes Inject 140 mg into the skin every 14 (fourteen) days. Laurey Morale, MD Taking Active Self, Pharmacy Records, Other  fluconazole (DIFLUCAN) 100 MG tablet [id number] Yes Take 1 tablet (100 mg total) by mouth daily. [name], MD Taking Active Pharmacy Records, Other  fluticasone [facility]) 50 MCG/ACT nasal spray [id number] Yes Place 2 sprays into both nostrils daily as needed for allergies or rhinitis. [name], MD Taking Active Pharmacy Records, Other  fluticasone-salmeterol [facility]) 250-50 MCG/ACT AEPB [id number] Yes Inhale 1 puff into the lungs in the morning and at bedtime. Luciano Cutter, MD Taking Active Pharmacy Records, Other  gabapentin (NEURONTIN) 100 MG capsule [id number] Yes Take 1 capsule (100 mg total) by mouth 3 (three) times daily. Barbaraann Share, DPM Taking Active Pharmacy Records, Other  hydrOXYzine (ATARAX) 10 MG tablet [id number] Yes TAKE 1 TABLET BY MOUTH 3 TIMES DAILY AS NEEDED FOR ITCHING. Strength: 10 mg [name], MD Taking Active Self, Pharmacy Records, Other           Med Note Kittie Plater, Marlis Edelson Sep 04, 2023  2:56 PM)    ipratropium-albuterol (DUONEB) 0.5-2.5 (3) MG/3ML Criss Rosales [id number] Yes TAKE 3 ML BY NEBULIZATION EVERY 4 HOURS AS NEEDED (WHEEZING). [name], MD Taking Active Self, Pharmacy Records, Other  ketoconazole (NIZORAL) 2 % cream [id number] Yes Apply 1 Application topically daily. [name], MD Taking Active Pharmacy Records, Other  losartan (COZAAR) 25 MG tablet [id number] Yes Take 1 tablet (25 mg total) by mouth daily. Allayne Butcher, PA-C Taking Active Self, Pharmacy Records,  Other  mupirocin ointment (BACTROBAN) 2 % [id number] Yes Apply 1 Application topically 2 (two) times daily for 14 days. Apply to right 2nd toe and to back of left heel Barbaraann Share, DPM Taking Active Pharmacy Records, Other  nystatin (MYCOSTATIN/NYSTOP) powder [id number] Yes Use as directed twice per day as needed to the affected area [name], MD Taking Active Pharmacy Records, Other  omeprazole (PRILOSEC) 20 MG capsule [id number]  TAKE 1 CAPSULE BY MOUTH TWICE A DAY [name], MD  Active Pharmacy Records, Other  potassium chloride SA (KLOR-CON M20) 20 MEQ tablet [id number]  Take 1 tablet (20 mEq total) by mouth daily. TAKE (2 TABLETS) IN THE MORNING  Patient taking differently: Take 20 mEq by mouth daily. TAKE 20 MEQ (1 TABLETS) IN THE MORNING   Laurey Morale, MD  Active Pharmacy Records, Other  predniSONE (DELTASONE) 10 MG tablet [id number] Yes TAKE 1 TABLET (10 MG TOTAL) BY MOUTH DAILY WITH BREAKFAST. Judi Saa, DO Taking Active Pharmacy Records, Other  silver sulfADIAZINE (SILVADENE) 1 % cream [id number] No APPLY TOPICALLY TO AFFECTED AREA EVERY DAY  Patient not taking: Reported on 01/28/2024   [name], MD Not Taking Active Self, Pharmacy Records, Other           Med Note [facility],  Eustacio Highman   Wed Sep 04, 2023  2:58 PM)    tamsulosin (FLOMAX) 0.4 MG CAPS capsule [id number] Yes TAKE 1 CAPSULE BY MOUTH EVERY DAY Roslyn Coombe, MD Taking Active Self, Pharmacy Records, Other  Tiotropium Bromide Monohydrate (SPIRIVA RESPIMAT) 2.5 MCG/ACT AERS [id number] Yes Inhale 2 puffs into the lungs daily. Quillian Brunt, MD Taking Active Pharmacy Records, Other  Tiotropium Bromide Monohydrate (SPIRIVA RESPIMAT) 2.5 MCG/ACT AERS [id number] Yes Take 2 puffs by nebulization daily. Raejean Bullock, NP Taking Active   tirzepatide [facility]) 2.5 MG/0.5ML Pen [id number] Yes Inject 2.5 mg into the skin once a week. Roslyn Coombe, MD Taking Active Pharmacy Records, Other  tiZANidine (ZANAFLEX) 4  MG tablet [id number] Yes Take 4 mg by mouth 3 (three) times daily. [provider] Taking Active Pharmacy Records, Other  torsemide (DEMADEX) 20 MG tablet [id number] Yes TAKE 5 TABLETS (100 MG TOTAL) BY MOUTH EVERY MORNING AND 4 TABLETS (80 MG TOTAL) EVERY EVENING. Darlis Eisenmenger, MD Taking Active Pharmacy Records, Other  traMADol [facility]) 50 MG tablet [id number] Yes Take 1 tablet (50 mg total) by mouth 4 (four) times daily. Roslyn Coombe, MD Taking Active Pharmacy Records, Other  traZODone (DESYREL) 100 MG tablet [id number] Yes Take 200 mg by mouth at bedtime as needed. [provider] Taking Active Pharmacy Records, Other  TUMS 500 MG chewable tablet [id number] Yes Chew 500-2,000 mg by mouth every 4 (four) hours as needed for indigestion or heartburn. [provider] Taking Active Self, Pharmacy Records, Other           Med Note Ivor Mars, Annabell Baron   Mon Feb 18, 2023  1:29 PM)    valACYclovir (VALTREX) 1000 MG tablet [id number] Yes Take 1 tablet (1,000 mg total) by mouth 3 (three) times daily. Roslyn Coombe, MD Taking Active Pharmacy Records, Other  venlafaxine XR (EFFEXOR-XR) 150 MG 24 hr capsule [id number] Yes TAKE 1 CAPSULE BY MOUTH DAILY WITH BREAKFAST. Roslyn Coombe, MD Taking Active Pharmacy Records, Other  zolpidem [facility]) 10 MG tablet [id number] Yes Take 1 tablet (10 mg total) by mouth at bedtime as needed for sleep. Roslyn Coombe, MD Taking Active Pharmacy Records, Other            Home Care and Equipment/Supplies: Were Home Health Services Ordered?: Yes Name of Home Health Agency:: [name] Has Agency set up a time to come to your home?: No EMR reviewed for Home Health Orders: Orders present/patient has not received call (refer to CM for follow-up) (Phone number given to patient to check on time they will be coming) Any new equipment or medical supplies ordered?: No  Functional Questionnaire: Do you need assistance with bathing/showering or dressing?: Yes Do  you need assistance with meal preparation?: Yes Do you need assistance with eating?: No Do you have difficulty maintaining continence: No Do you need assistance with getting out of bed/getting out of a chair/moving?: Yes Do you have difficulty managing or taking your medications?: No  Follow up appointments reviewed: PCP Follow-up appointment confirmed?: Yes Date of PCP follow-up appointment?: 02/06/24 Follow-up Provider: Dr Rosalia Colonel Specialist [facility] Follow-up appointment confirmed?: Yes Date of Specialist follow-up appointment?: 03/04/24 Follow-Up Specialty Provider:: CHF clinic Do you need transportation to your follow-up appointment?: No Do you understand care options if your condition(s) worsen?: Yes-patient verbalized understanding  SDOH Interventions Today    Flowsheet Row Most Recent Value  SDOH Interventions   Food Insecurity Interventions Intervention Not Indicated  Housing Interventions Intervention Not  Indicated  Utilities Interventions Intervention Not Indicated       Goals Addressed             This Visit's Progress    VBCI Transitions of Care (TOC) Care Plan       Problems:  Recent Hospitalization for treatment of  Sepsis Equipment/DME barrier Patient will ask PCP for order for 3 in one for elevation of the toliet  Goal:  Over the next 30 days, the patient will not experience hospital readmission  Interventions:  Transitions of Care: Durable Medical Equipment (DME) needs assessed with patient/caregiver Doctor Visits  - discussed the importance of doctor visits Arranged PCP follow-up within 12-14 days (Care Guide Scheduled) Contacted Health RN/OT/PT - Patient given number to contact the number to contact [facility]   Post-op wound/incision care reviewed with patient/caregiver  Patient Self Care Activities:  Attend all scheduled provider appointments Call pharmacy for medication refills 3-7 days in advance of running out of medications Call provider  office for new concerns or questions  Participate in Transition of Care Program/Attend TOC scheduled calls Take medications as prescribed    Plan:  An initial telephone outreach has been scheduled for:  Next PCP appointment scheduled for: [id number]      Patient has consent to Transition of Care out reach Laine Tousey scheduled for telephone outreach 04//22/2025  Una Ganser BSN RN [facility] Population Health Care Management Coordinator [email or web address] Direct Dial: [phone]  Fax: [phone] Website: [email or web address]

## 2024-01-29 LAB — CULTURE, BLOOD (ROUTINE X 2): Culture: NO GROWTH

## 2024-01-30 ENCOUNTER — Ambulatory Visit: Admitting: Podiatry

## 2024-01-30 MED ORDER — TIRZEPATIDE 5 MG/0.5ML ~~LOC~~ SOAJ: 5.0000 mg | SUBCUTANEOUS | 3 refills | Status: DC

## 2024-01-31 ENCOUNTER — Telehealth (HOSPITAL_COMMUNITY): Payer: Self-pay

## 2024-01-31 NOTE — Telephone Encounter (Signed)
 Called to confirm/remind patient of their appointment at the [facility] on 02/03/24.   Appointment:   [x] Confirmed  [] Left mess   [] No answer/No voice mail  [] VM Full/unable to leave message  [] Phone not in service  Patient reminded to bring all medications and/or complete list.  Confirmed patient has transportation. Gave directions, instructed to utilize valet parking.

## 2024-02-01 ENCOUNTER — Encounter: Payer: Self-pay | Admitting: Internal Medicine

## 2024-02-03 ENCOUNTER — Ambulatory Visit (HOSPITAL_COMMUNITY)
Admission: RE | Admit: 2024-02-03 | Discharge: 2024-02-03 | Disposition: A | Discharge status: Home / Self Care | Payer: Medicare HMO | Source: Ambulatory Visit | Attending: Family Medicine

## 2024-02-03 ENCOUNTER — Telehealth: Payer: Self-pay

## 2024-02-03 VITALS — BP 96/64 | HR 71

## 2024-02-03 DIAGNOSIS — R6521 Severe sepsis with septic shock: Secondary | ICD-10-CM | POA: Diagnosis present

## 2024-02-03 DIAGNOSIS — Z6841 Body Mass Index (BMI) 40.0 and over, adult: Secondary | ICD-10-CM | POA: Diagnosis not present

## 2024-02-03 DIAGNOSIS — I959 Hypotension, unspecified: Secondary | ICD-10-CM | POA: Diagnosis not present

## 2024-02-03 DIAGNOSIS — G243 Spasmodic torticollis: Secondary | ICD-10-CM | POA: Diagnosis not present

## 2024-02-03 DIAGNOSIS — M7989 Other specified soft tissue disorders: Secondary | ICD-10-CM | POA: Diagnosis not present

## 2024-02-03 DIAGNOSIS — Z9981 Dependence on supplemental oxygen: Secondary | ICD-10-CM | POA: Insufficient documentation

## 2024-02-03 DIAGNOSIS — I442 Atrioventricular block, complete: Secondary | ICD-10-CM | POA: Diagnosis not present

## 2024-02-03 DIAGNOSIS — Z87891 Personal history of nicotine dependence: Secondary | ICD-10-CM | POA: Insufficient documentation

## 2024-02-03 DIAGNOSIS — W19XXXA Unspecified fall, initial encounter: Secondary | ICD-10-CM | POA: Insufficient documentation

## 2024-02-03 DIAGNOSIS — I428 Other cardiomyopathies: Secondary | ICD-10-CM | POA: Insufficient documentation

## 2024-02-03 DIAGNOSIS — R1314 Dysphagia, pharyngoesophageal phase: Secondary | ICD-10-CM | POA: Diagnosis not present

## 2024-02-03 DIAGNOSIS — Z79899 Other long term (current) drug therapy: Secondary | ICD-10-CM | POA: Insufficient documentation

## 2024-02-03 DIAGNOSIS — I493 Ventricular premature depolarization: Secondary | ICD-10-CM | POA: Insufficient documentation

## 2024-02-03 DIAGNOSIS — Z5986 Financial insecurity: Secondary | ICD-10-CM | POA: Insufficient documentation

## 2024-02-03 DIAGNOSIS — J4489 Other specified chronic obstructive pulmonary disease: Secondary | ICD-10-CM | POA: Diagnosis present

## 2024-02-03 DIAGNOSIS — R0902 Hypoxemia: Secondary | ICD-10-CM | POA: Diagnosis not present

## 2024-02-03 DIAGNOSIS — S81812A Laceration without foreign body, left lower leg, initial encounter: Secondary | ICD-10-CM | POA: Insufficient documentation

## 2024-02-03 DIAGNOSIS — L02416 Cutaneous abscess of left lower limb: Secondary | ICD-10-CM | POA: Diagnosis not present

## 2024-02-03 DIAGNOSIS — M6259 Muscle wasting and atrophy, not elsewhere classified, multiple sites: Secondary | ICD-10-CM | POA: Diagnosis not present

## 2024-02-03 DIAGNOSIS — J438 Other emphysema: Secondary | ICD-10-CM | POA: Diagnosis not present

## 2024-02-03 DIAGNOSIS — N170 Acute kidney failure with tubular necrosis: Secondary | ICD-10-CM | POA: Diagnosis present

## 2024-02-03 DIAGNOSIS — I2510 Atherosclerotic heart disease of native coronary artery without angina pectoris: Secondary | ICD-10-CM | POA: Insufficient documentation

## 2024-02-03 DIAGNOSIS — E11649 Type 2 diabetes mellitus with hypoglycemia without coma: Secondary | ICD-10-CM | POA: Diagnosis present

## 2024-02-03 DIAGNOSIS — I5023 Acute on chronic systolic (congestive) heart failure: Secondary | ICD-10-CM | POA: Insufficient documentation

## 2024-02-03 DIAGNOSIS — I4821 Permanent atrial fibrillation: Secondary | ICD-10-CM | POA: Insufficient documentation

## 2024-02-03 DIAGNOSIS — K922 Gastrointestinal hemorrhage, unspecified: Secondary | ICD-10-CM | POA: Diagnosis present

## 2024-02-03 DIAGNOSIS — L03115 Cellulitis of right lower limb: Secondary | ICD-10-CM | POA: Diagnosis present

## 2024-02-03 DIAGNOSIS — R0600 Dyspnea, unspecified: Secondary | ICD-10-CM | POA: Diagnosis not present

## 2024-02-03 DIAGNOSIS — R443 Hallucinations, unspecified: Secondary | ICD-10-CM | POA: Diagnosis present

## 2024-02-03 DIAGNOSIS — G4733 Obstructive sleep apnea (adult) (pediatric): Secondary | ICD-10-CM | POA: Diagnosis not present

## 2024-02-03 DIAGNOSIS — I4891 Unspecified atrial fibrillation: Secondary | ICD-10-CM | POA: Diagnosis not present

## 2024-02-03 DIAGNOSIS — R41841 Cognitive communication deficit: Secondary | ICD-10-CM | POA: Diagnosis not present

## 2024-02-03 DIAGNOSIS — A419 Sepsis, unspecified organism: Secondary | ICD-10-CM | POA: Diagnosis present

## 2024-02-03 DIAGNOSIS — M129 Arthropathy, unspecified: Secondary | ICD-10-CM | POA: Diagnosis not present

## 2024-02-03 DIAGNOSIS — G894 Chronic pain syndrome: Secondary | ICD-10-CM | POA: Diagnosis not present

## 2024-02-03 DIAGNOSIS — R2681 Unsteadiness on feet: Secondary | ICD-10-CM | POA: Diagnosis not present

## 2024-02-03 DIAGNOSIS — D849 Immunodeficiency, unspecified: Secondary | ICD-10-CM | POA: Diagnosis present

## 2024-02-03 DIAGNOSIS — I5022 Chronic systolic (congestive) heart failure: Secondary | ICD-10-CM | POA: Diagnosis present

## 2024-02-03 DIAGNOSIS — N179 Acute kidney failure, unspecified: Secondary | ICD-10-CM | POA: Diagnosis not present

## 2024-02-03 DIAGNOSIS — J449 Chronic obstructive pulmonary disease, unspecified: Secondary | ICD-10-CM | POA: Insufficient documentation

## 2024-02-03 DIAGNOSIS — Z8616 Personal history of COVID-19: Secondary | ICD-10-CM | POA: Insufficient documentation

## 2024-02-03 DIAGNOSIS — Z96652 Presence of left artificial knee joint: Secondary | ICD-10-CM | POA: Diagnosis not present

## 2024-02-03 DIAGNOSIS — E8729 Other acidosis: Secondary | ICD-10-CM | POA: Diagnosis present

## 2024-02-03 DIAGNOSIS — I5020 Unspecified systolic (congestive) heart failure: Secondary | ICD-10-CM | POA: Diagnosis not present

## 2024-02-03 DIAGNOSIS — R1311 Dysphagia, oral phase: Secondary | ICD-10-CM | POA: Diagnosis not present

## 2024-02-03 DIAGNOSIS — F32A Depression, unspecified: Secondary | ICD-10-CM | POA: Diagnosis present

## 2024-02-03 DIAGNOSIS — G8929 Other chronic pain: Secondary | ICD-10-CM | POA: Insufficient documentation

## 2024-02-03 DIAGNOSIS — Z7901 Long term (current) use of anticoagulants: Secondary | ICD-10-CM | POA: Insufficient documentation

## 2024-02-03 DIAGNOSIS — Z95810 Presence of automatic (implantable) cardiac defibrillator: Secondary | ICD-10-CM | POA: Insufficient documentation

## 2024-02-03 DIAGNOSIS — M25519 Pain in unspecified shoulder: Secondary | ICD-10-CM | POA: Insufficient documentation

## 2024-02-03 DIAGNOSIS — J9611 Chronic respiratory failure with hypoxia: Secondary | ICD-10-CM | POA: Diagnosis present

## 2024-02-03 DIAGNOSIS — Z7401 Bed confinement status: Secondary | ICD-10-CM | POA: Diagnosis not present

## 2024-02-03 DIAGNOSIS — I110 Hypertensive heart disease with heart failure: Secondary | ICD-10-CM | POA: Diagnosis present

## 2024-02-03 DIAGNOSIS — I509 Heart failure, unspecified: Secondary | ICD-10-CM | POA: Diagnosis not present

## 2024-02-03 DIAGNOSIS — R571 Hypovolemic shock: Secondary | ICD-10-CM | POA: Diagnosis present

## 2024-02-03 DIAGNOSIS — R34 Anuria and oliguria: Secondary | ICD-10-CM | POA: Diagnosis present

## 2024-02-03 DIAGNOSIS — M869 Osteomyelitis, unspecified: Secondary | ICD-10-CM | POA: Diagnosis not present

## 2024-02-03 DIAGNOSIS — R531 Weakness: Secondary | ICD-10-CM | POA: Diagnosis present

## 2024-02-03 DIAGNOSIS — R001 Bradycardia, unspecified: Secondary | ICD-10-CM | POA: Diagnosis not present

## 2024-02-03 DIAGNOSIS — I10 Essential (primary) hypertension: Secondary | ICD-10-CM | POA: Diagnosis not present

## 2024-02-03 DIAGNOSIS — R5383 Other fatigue: Secondary | ICD-10-CM | POA: Diagnosis not present

## 2024-02-03 DIAGNOSIS — L03116 Cellulitis of left lower limb: Secondary | ICD-10-CM | POA: Diagnosis present

## 2024-02-03 DIAGNOSIS — R600 Localized edema: Secondary | ICD-10-CM | POA: Diagnosis not present

## 2024-02-03 DIAGNOSIS — M6281 Muscle weakness (generalized): Secondary | ICD-10-CM | POA: Diagnosis not present

## 2024-02-03 DIAGNOSIS — E2740 Unspecified adrenocortical insufficiency: Secondary | ICD-10-CM | POA: Diagnosis present

## 2024-02-03 DIAGNOSIS — R0689 Other abnormalities of breathing: Secondary | ICD-10-CM | POA: Diagnosis not present

## 2024-02-03 DIAGNOSIS — I429 Cardiomyopathy, unspecified: Secondary | ICD-10-CM | POA: Diagnosis not present

## 2024-02-03 LAB — COMPREHENSIVE METABOLIC PANEL WITH GFR
ALT: 31 U/L (ref 0–44)
AST: 21 U/L (ref 15–41)
Albumin: 3.4 g/dL — ABNORMAL LOW (ref 3.5–5.0)
Alkaline Phosphatase: 58 U/L (ref 38–126)
Anion gap: 8 (ref 5–15)
BUN: 26 mg/dL — ABNORMAL HIGH (ref 8–23)
CO2: 28 mmol/L (ref 22–32)
Calcium: 9.2 mg/dL (ref 8.9–10.3)
Chloride: 103 mmol/L (ref 98–111)
Creatinine, Ser: 0.99 mg/dL (ref 0.61–1.24)
GFR, Estimated: >60 mL/min (ref >60)
Glucose, Bld: 105 mg/dL — ABNORMAL HIGH (ref 70–99)
Potassium: 4.9 mmol/L (ref 3.5–5.1)
Sodium: 139 mmol/L (ref 135–145)
Total Bilirubin: 0.6 mg/dL (ref 0.0–1.2)
Total Protein: 6.1 g/dL — ABNORMAL LOW (ref 6.5–8.1)

## 2024-02-03 LAB — CBC
HCT: 38.5 % — ABNORMAL LOW (ref 39.0–52.0)
Hemoglobin: 11.4 g/dL — ABNORMAL LOW (ref 13.0–17.0)
MCH: 29.5 pg (ref 26.0–34.0)
MCHC: 29.6 g/dL — ABNORMAL LOW (ref 30.0–36.0)
MCV: 99.7 fL (ref 80.0–100.0)
Platelets: 340 10*3/uL (ref 150–400)
RBC: 3.86 MIL/uL — ABNORMAL LOW (ref 4.22–5.81)
RDW: 14.8 % (ref 11.5–15.5)
WBC: 13.2 10*3/uL — ABNORMAL HIGH (ref 4.0–10.5)
nRBC: 0 % (ref 0.0–0.2)

## 2024-02-03 LAB — BRAIN NATRIURETIC PEPTIDE: B Natriuretic Peptide: 56.2 pg/mL (ref 0.0–100.0)

## 2024-02-03 LAB — MAGNESIUM: Magnesium: 2.5 mg/dL — ABNORMAL HIGH (ref 1.7–2.4)

## 2024-02-03 MED ORDER — TORSEMIDE 20 MG PO TABS: 100.0000 mg | ORAL_TABLET | Freq: Two times a day (BID) | ORAL | 8 refills | Status: DC

## 2024-02-03 NOTE — Patient Instructions (Addendum)
 Thank you for coming in today  If you had labs drawn today, any labs that are abnormal the clinic will call you No news is good news  Medications: STOP Coreg   Furoscix  daily for 3 days 02/03/2024, 02/04/2024, 02/05/2024  02/06/2024 increase Torsemide  to 100 mg 2 times daily  with Potassium 20 meq daily  Your provider has order Furoscix  for you. This is an on-body infuser that gives you a dose of Furosemide .   It will be shipped to your home   [employer]uro[employer]  Direct will call you to discuss before shipping so, PLEASE answer unknown calls  For questions regarding the device call [employer]  [employer] at [phone]  Ensure you write down the time you start your infusion so that if there is a problem you will know how long the infusion lasted  Use Furoscix  only AS DIRECTED by our office  Dosing Directions:   Day 1=02/03/2024 hold torsemide    Day 2=02/04/2024 hold torsemide    Day 3=02/05/2024 hold torsemide     Follow up appointments:  Your physician recommends that you schedule a follow-up appointment in:  1 week in clinic   Do the following things EVERYDAY: Weigh yourself in the morning before breakfast. Write it down and keep it in a log. Take your medicines as prescribed Eat low salt foods--Limit salt (sodium) to 2000 mg per day.  Stay as active as you can everyday Limit all fluids for the day to less than 2 liters   At the [facility], you and your health needs are our priority. As part of our continuing mission to provide you with exceptional heart care, we have created designated Provider Care Teams. These Care Teams include your primary Cardiologist (physician) and Advanced Practice Providers (APPs- Physician Assistants and Nurse Practitioners) who all work together to provide you with the care you need, when you need it.   You may see any of the following providers on your designated Care Team at your next follow up: Dr Jules Oar Dr Peder Bourdon Dr. Mimi Alt, NP Ruddy Corral, [place] [facility], [place] Dennise Fitz, NP Luster Salters, PharmD   Please be sure to bring in all your medications bottles to every appointment.    Thank you for choosing [facility]  If you have any questions or concerns before your next appointment please send us  a message through [place] or call our office at [phone].    TO LEAVE A MESSAGE FOR THE NURSE SELECT OPTION 2, PLEASE LEAVE A MESSAGE INCLUDING: YOUR NAME DATE OF BIRTH CALL BACK NUMBER REASON FOR CALL**this is important as we prioritize the call backs  YOU WILL RECEIVE A CALL BACK THE SAME DAY AS LONG AS YOU CALL BEFORE 4:00 PM

## 2024-02-03 NOTE — Progress Notes (Signed)
 ADVANCED HF CLINIC NOTE   ID:  [name], DOB [birth year], MRN [id number]  Provider location: [facility] Type of Visit: Established patient  PCP:  Roslyn Coombe, MD  Cardiologist:  Dr. Mitzie Anda  Chief complaint: Heart failure follow up    History of Present Illness: [name] is a 68 y.o. male who has history of COPD on home oxygen , permanent atrial fibrillation, and chronic systolic CHF.  Amiodarone  and DCCV were tried for atrial fibrillation in the past without success.   Patient has been noted to have a low EF since 2013.  EF improved to normal by 2015 but dropped to 30-35% by 11/16.  Cath in 6/17 showed some CAD but not enough to cause cardiomyopathy.  He was admitted in 3/18 with PNA, atrial fibrillation/RVR, and acute on chronic systolic CHF.  Echo in 3/18 showed fall in EF to 10-15%.   Underwent right and left heart cath in 4/18, which showed nonobstructive CAD and relatively optimized filling pressures.    Syncope in 11/18. Loop recorder showed 6 second pause and periods of complete heart block. He had Medtronic BiV ICD placed in 12/18.  In 1/19, he had AV nodal ablation to promote BiV pacing.   Echo was done 6/19 with EF 25-30% with moderate RV dilation/mildly decreased systolic function. CPX 7/19 was submaximal, probably mild functional impairment.    Echo in 1/21 showed EF 35-40% with diffuse hypokinesis, moderate RV dilation with mildly decreased systolic function.   Admitted with COVID-19 PNA in 2/19.  He had AKI/hypotension and Entresto  was stopped.   Echo in 3/22 showed EF 40% with diffuse hypokinesis, mild RV enlargement, mildly decreased RV systolic function.   Presented to [facility] 7/22 with weakness and dizziness. Found to be in hemorrhagic shock from acute GIB. Hgb 5.0, INR 8.4. He was on Coumadin  for atrial fibrillation. Resuscitated with PRBCs, IVF, and given Vitamin K. GDMT held d/t hypotension. Initially required ICU due to hypotension  requiring pressors. GI consulted and planned to treat with oral antibiotics for suspected diverticular bleed with plans for outpatient colonoscopy.  Coumadin  was transitioned to Eliquis .  Discharged back on beta blocker, Entresto  and SGLT2i. Dig, spiro and torsemide  were held at discharge. Weight 259 lbs.  S/p TKR 1/23  Follow up 12/23, NYHA II-early III, volume overloaded and torsemide  increased to 80 bid.   Echo 1/24 EF 40%, RV normal, no significant change from prior.  Admitted 2/24 w/ RLE cellulitis, failed outpatient abx. Received IV eventually transitioned to oral cefadroxil . Followed by ID, saw Dr. Levern Reader 12/18/22 and arranged for CT of RLE to rule out deep tissue abscess. Finished abx course.  Admitted 4/35 with hypovolemic shock 2/2 diarrhea. Required ICU care and vasopressors. Qualified for O2 at discharge, weight 274 lbs.  Today he returns for HF follow up with his friend. Overall feeling poorly. Feels more SOB, he has chronic shoulder pain. He feels dizzy & feels he has fluid on board. Denies palpitations, abnormal bleeding, CP, or PND/Orthopnea. He has early satiety. Weight at home 280 pounds. Taking all medications. Wearing 2L oxygen  now. Has skin tear on left shin from fall.  MDT device interrogation (personally reviewed): OptiVol up, thoracic impedence down, 0.2 hr/day activity, 95% VP  ECG (personally reviewed): V-paced with PVCs  PMH: 1. Asthma 2. COPD: On home oxygen . Prior smoker.  3. Atrial fibrillation: Permanent.  He failed DCCV and amiodarone  in the past . 4. HTN 5. GERD 6. Hyperlipidemia: Myalgias with atorvastatin   7. BPH.  8. Chronic systolic CHF: Nonischemic cardiomyopathy.  - EF 25% by TEE in 7/13.  Normalized on 2015 echo. - Echo (11/16): EF 30-35%.  - LHC/RHC (6/17): 80% ostial stenosis small D1, 30-40% pLAD.  Mean RA 4, PA 26/10, mean PCWP 11, CI 2.33.  - Echo (3/18): EF 10-15%, diffuse hypokinesis, severe LV dilation, moderate RV dilation with severely  decreased systolic function.  - LHC/RHC (4/18): 40% proximal LAD.  Mean RA 8, PA 37/10 mean 22, mean PCWP 22, LVEDP 9, CI 2.39. - Medtronic BiV ICD placement in 12/18 with AV nodal ablation to promote BiV pacing in 1/19.  - Echo (6/19): EF 25-30% with mild LV dilation, moderately dilated RV with mildly decreased systolic function, IVC not dilated, PASP 23 mmHg.  - CPX (7/19): peak VO2 17.6 (78% predicted), VE/VCO2 slope 31, RER 1.02 => submaximal, probably mild functional impairment.  - Echo (1/21): EF 35-40% with diffuse hypokinesis, moderate RV dilation with mildly decreased systolic function. - Echo (3/22): EF 40% with diffuse hypokinesis, mild RV enlargement, mildly decreased RV systolic function.  - Echo (1/24): EF 40%, RV normal - Echo (1/25): EF 40-45%, mild aortic stenosis 9. Morbid obesity 10. ILR in place.  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 Complete heart block: s/p Medtronic CRT-D.  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 OSA: Untreated, unable to tolerate CPAP.  15. COVID-19 PNA 2/21.  16. Hyperlipidemia: Myalgias with multiple statins.  17. PAD: ABIs 10/21 noncompressible on right but TBI normal, normal ABI and TBI on left.  18. GI bleeding: 7/22, diverticulosis.  19. Left TKR (1/23)  Social History   Socioeconomic History   Marital status: Significant Other    Spouse name: Not on file   Number of children: 2   Years of education: Not on file   Highest education level: GED or equivalent  Occupational History   Occupation: retired/disabled. prev worked in Teacher, English as a foreign language.    Employer: DISABLED  Tobacco Use   Smoking status: Former    Current packs/day: 0.00    Average packs/day: 2.0 packs/day for 30.0 years (60.0 ttl pk-yrs)    Types: Cigarettes    Start date: 10/15/1977    Quit date: 10/16/2007    Years since quitting: 16.3   Smokeless tobacco: Never  Vaping Use   Vaping status: Never Used  Substance and Sexual Activity   Alcohol use: No   Drug use: No   Sexual  activity: Not Currently  Other Topics Concern   Not on file  Social History Narrative   Lives with girlfriend   Social Drivers of Health   Financial Resource Strain: Medium Risk (01/01/2024)   Overall Financial Resource Strain (CARDIA)    Difficulty of Paying Living Expenses: Somewhat hard  Food Insecurity: No Food Insecurity (01/28/2024)   Hunger Vital Sign    Worried About Running Out of Food in the Last Year: Never true    Ran Out of Food in the Last Year: Never true  Transportation Needs: No Transportation Needs (01/01/2024)   PRAPARE - Administrator, Civil Service (Medical): No    Lack of Transportation (Non-Medical): No  Physical Activity: Inactive (01/01/2024)   Exercise Vital Sign    Days of Exercise per Week: 0 days    Minutes of Exercise per Session: 0 min  Stress: No Stress Concern Present (01/01/2024)   [employer] of Occupational Health - Occupational Stress Questionnaire    Feeling of Stress : Only a little  Social Connections: Socially Isolated (01/01/2024)  Social Advertising account executive [NHANES]    Frequency of Communication with Friends and Family: Once a week    Frequency of Social Gatherings with Friends and Family: Never    Attends Religious Services: Never    Database administrator or Organizations: No    Attends Banker Meetings: Never    Marital Status: Divorced  Catering manager Violence: Not At Risk (01/28/2024)   Humiliation, Afraid, Rape, and Kick questionnaire    Fear of Current or Ex-Partner: No    Emotionally Abused: No    Physically Abused: No    Sexually Abused: No   Family History  Problem Relation Age of Onset   COPD Mother    Asthma Mother    Colon polyps Mother    Allergies Mother    Hypothyroidism Mother    Asthma Maternal Grandmother    Colon cancer Neg Hx    ROS: All systems reviewed and negative except as per HPI.   Current Outpatient Medications  Medication Sig Dispense Refill   albuterol   (PROVENTIL ) (2.5 MG/3ML) 0.083% nebulizer solution Take 3 mLs (2.5 mg total) by nebulization every 6 (six) hours as needed for wheezing or shortness of breath. 360 mL 5   albuterol  (VENTOLIN  HFA) 108 (90 Base) MCG/ACT inhaler INHALE 1-2 PUFFS BY MOUTH EVERY 6 HOURS AS NEEDED FOR WHEEZE OR SHORTNESS OF BREATH 8.5 each 5   ALPRAZolam  (XANAX ) 0.5 MG tablet TAKE 2 TABLETS (1 MG TOTAL) BY MOUTH EVERY DAY AT BEDTIME 60 tablet 5   ammonium lactate  (AMLACTIN) 12 % cream Apply 1 Application topically as needed for dry skin. 385 g 3   apixaban  (ELIQUIS ) 5 MG TABS tablet Take 1 tablet (5 mg total) by mouth 2 (two) times daily. 60 tablet 11   arformoterol  (BROVANA ) 15 MCG/2ML NEBU Take 2 mLs (15 mcg total) by nebulization every 12 (twelve) hours. 120 mL 3   ascorbic acid (VITAMIN C) 500 MG tablet Take 500 mg by mouth daily.     augmented betamethasone  dipropionate (DIPROLENE -AF) 0.05 % cream Apply 1 application  topically daily as needed (to affected areas- for itching).     carisoprodol  (SOMA ) 350 MG tablet Take 1 tablet (350 mg total) by mouth 4 (four) times daily. 120 tablet 2   carvedilol  (COREG ) 3.125 MG tablet Take 3.125 mg by mouth 2 (two) times daily.     Cholecalciferol  (VITAMIN D3) 1000 units CAPS Take 1,000 Units by mouth daily.     clotrimazole -betamethasone  (LOTRISONE ) cream Apply 1 Application topically daily. 30 g 0   doxepin  (SINEQUAN ) 10 MG capsule TAKE 2 CAPSULES BY MOUTH AT BEDTIME AS NEEDED 180 capsule 1   DULoxetine  (CYMBALTA ) 60 MG capsule TAKE 1 CAPSULE BY MOUTH EVERY DAY 90 capsule 2   Dupilumab (DUPIXENT) 300 MG/2ML SOPN Inject 300 mg into the skin every 14 (fourteen) days.     Evolocumab  (REPATHA  SURECLICK) 140 MG/ML SOAJ Inject 140 mg into the skin every 14 (fourteen) days. 6 mL 3   fluconazole  (DIFLUCAN ) 100 MG tablet Take 1 tablet (100 mg total) by mouth daily. 7 tablet 0   fluticasone  (FLONASE ) 50 MCG/ACT nasal spray Place 2 sprays into both nostrils daily as needed for allergies  or rhinitis. 48 mL 1   fluticasone -salmeterol (WIXELA INHUB) 250-50 MCG/ACT AEPB Inhale 1 puff into the lungs in the morning and at bedtime. 60 each 11   gabapentin  (NEURONTIN ) 100 MG capsule Take 1 capsule (100 mg total) by mouth 3 (three) times daily. 90 capsule  3   hydrOXYzine  (ATARAX ) 10 MG tablet TAKE 1 TABLET BY MOUTH 3 TIMES DAILY AS NEEDED FOR ITCHING. Strength: 10 mg 60 tablet 2   ipratropium-albuterol  (DUONEB) 0.5-2.5 (3) MG/3ML SOLN TAKE 3 ML BY NEBULIZATION EVERY 4 HOURS AS NEEDED (WHEEZING). 360 mL 0   ketoconazole  (NIZORAL ) 2 % cream Apply 1 Application topically daily. 60 g 2   losartan  (COZAAR ) 25 MG tablet Take 1 tablet (25 mg total) by mouth daily. 90 tablet 3   nystatin  (MYCOSTATIN /NYSTOP ) powder Use as directed twice per day as needed to the affected area 60 g 2   omeprazole  (PRILOSEC) 20 MG capsule TAKE 1 CAPSULE BY MOUTH TWICE A DAY 180 capsule 1   potassium chloride  SA (KLOR-CON  M20) 20 MEQ tablet Take 1 tablet (20 mEq total) by mouth daily. TAKE (2 TABLETS) IN THE MORNING (Patient taking differently: Take 20 mEq by mouth daily. TAKE 20 MEQ (1 TABLETS) IN THE MORNING) 60 tablet 3   predniSONE  (DELTASONE ) 10 MG tablet TAKE 1 TABLET (10 MG TOTAL) BY MOUTH DAILY WITH BREAKFAST. 30 tablet 0   silver  sulfADIAZINE  (SILVADENE ) 1 % cream APPLY TOPICALLY TO AFFECTED AREA EVERY DAY 50 g 3   tamsulosin  (FLOMAX ) 0.4 MG CAPS capsule TAKE 1 CAPSULE BY MOUTH EVERY DAY 90 capsule 3   Tiotropium Bromide  Monohydrate (SPIRIVA  RESPIMAT) 2.5 MCG/ACT AERS Take 2 puffs by nebulization daily. 4 g 3   tirzepatide (MOUNJARO) 5 MG/0.5ML Pen Inject 5 mg into the skin once a week. 6 mL 3   tiZANidine  (ZANAFLEX ) 4 MG tablet Take 4 mg by mouth 3 (three) times daily.     torsemide  (DEMADEX ) 20 MG tablet TAKE 5 TABLETS (100 MG TOTAL) BY MOUTH EVERY MORNING AND 4 TABLETS (80 MG TOTAL) EVERY EVENING. 810 tablet 2   traMADol  (ULTRAM ) 50 MG tablet Take 1 tablet (50 mg total) by mouth 4 (four) times daily.      traZODone  (DESYREL ) 100 MG tablet Take 200 mg by mouth at bedtime as needed.     TUMS 500 MG chewable tablet Chew 500-2,000 mg by mouth every 4 (four) hours as needed for indigestion or heartburn.     valACYclovir  (VALTREX ) 1000 MG tablet Take 1 tablet (1,000 mg total) by mouth 3 (three) times daily. 21 tablet 3   venlafaxine  XR (EFFEXOR -XR) 150 MG 24 hr capsule TAKE 1 CAPSULE BY MOUTH DAILY WITH BREAKFAST. 90 capsule 3   zolpidem  (AMBIEN ) 10 MG tablet Take 1 tablet (10 mg total) by mouth at bedtime as needed for sleep. 90 tablet 1   No current facility-administered medications for this encounter.   Wt Readings from Last 3 Encounters:  01/27/24 124.5 kg (274 lb 8 oz)  01/22/24 122 kg (269 lb)  01/21/24 122.3 kg (269 lb 9.6 oz)   BP 96/64   Pulse 71   SpO2 99% Comment: 2-liters  PHYSICAL EXAM: General:  NAD. No resp difficulty, walked into clinic with RW on oxygen , chronically-ill appearing. HEENT: Normal Neck: Supple. Thick neck, JVP 10-12 Cor: Regular rate & rhythm. No rubs, gallops or murmurs. Lungs: Clear, diminished in bases. Abdomen: Soft, obese, nontender, + distended.  Extremities: No cyanosis, clubbing, rash, 2 + BLE edema; bandage to L shin Neuro: Alert & oriented x 3, moves all 4 extremities w/o difficulty. Affect pleasant.  Assessment/Plan:  1. Atrial fibrillation: Permanent.  Now s/p AV nodal ablation to allow effective BiV pacing.  - Continue Eliquis  5 mg bid. CBC today. 2. COPD: No longer smoking.  Not  using oxygen . I think that COPD plays a major role in his dyspnea.  - Followed by Dr. Washington Hacker  3. Acute on Chronic systolic CHF: Nonischemic cardiomyopathy based on 6/17 cath. However, he had a recent significant fall in EF from 30-35% to 10-15% on echo from 3/18.  LHC/RHC in 4/18 showed nonobstructive CAD and relatively optimized filling pressures.  He now has Medtronic CRT-D device with AV nodal ablation to allow BiV pacing.  Echo 6/19 with EF 25-30% with mildly  decreased RV systolic function. CPX 7/19 was submaximal but likely only mild functional limitation from CHF.  Echo in 1/21 showed EF 35-40%, echo in 3/22 and again in 1/24 showed EF up to 40% with mildly decreased RV systolic function.  Echo 1/25 showed EF 40-45%, mild aortic stenosis. Worse NYHA class IIIb-early IV, confounded by body habitus, COPD, and physical deconditioning. He is volume overloaded by exam and OptiVol. He was unable to stand for weight today in clinic, but home weight is up. - With soft BP and decompensation, stop Coreg . - Use Furoscix  80 mg + 40 KCL daily x 3 days (hold torsemide  while using Furoscix ). BMET/BNP today - After 3 days, resume higher dose of torsemide  100 mg bid, with 20 KCL daily.  - Continue losartan  25 mg daily. Did not tolerate Entresto  due to AKI and hypotension  - He is on eplerenone , unclear when this was stopped. Will keep off for now with plans to add back as BP allows. 4. CAD: Nonobstructive on 4/18 cath.  No exertional chest pain, rare atypical chest pain.  - No ASA with Eliquis  use. - Statin myalgias, continue Repatha . Good LDL in 9/24.  5. Complete heart block: Now has Medtronic CRT-D device and s/p AV nodal ablation.  - Device interrogation as above. 6. OSA: Unable to tolerate CPAP.  - No change.   Follow up in 1 week with APP for fluid check. He is high risk of re-admission.  Signed, Elmarie Hacking, FNP  02/03/2024  [facility] [address] [facility] [place] [address] [phone] (office) [phone] (fax)

## 2024-02-04 ENCOUNTER — Encounter (HOSPITAL_COMMUNITY): Payer: Self-pay

## 2024-02-04 ENCOUNTER — Other Ambulatory Visit: Payer: Self-pay | Admitting: *Deleted

## 2024-02-04 MED ORDER — TRAMADOL HCL 50 MG PO TABS: 50.0000 mg | ORAL_TABLET | Freq: Four times a day (QID) | ORAL | 0 refills | Status: DC | PRN

## 2024-02-04 NOTE — Transitions of Care (Post Inpatient/ED Visit) (Signed)
 Transition of Care week 2/ day # 7  Visit Note  02/04/2024  Name: [name] MRN: [id number]          DOB: 30-Jan-1956  Situation: Patient enrolled in [facility] 30-day program. Visit completed with patient by telephone.   HIPAA identifiers x 2 verified  Background:  Recent hospital admission April 11-14, 2025 for sepsis/ acute renal failure, syncope, viral gastroenteritis; (L) leg skin tear 2 unplanned hospital admissions in last 12 months  Initial Transition Care Management Follow-up Telephone Call    Past Medical History:  Diagnosis Date   AICD (automatic cardioverter/defibrillator) present    Anemia    supposed to be taking Vit B but doesn't   ANXIETY    takes Xanax  nightly   Arthritis    Asthma    Albuterol  prn and Advair  daily;also takes Prednisone  daily   Cardiomyopathy (HCC)    a. EF 25% TEE July 2013; b. EF normalized 2015;  c. 03/2015 Echo: EF 40-45%, difrf HK, PASP 38 mmHg, Mild MR, sev LAE/RAE.   Chronic constipation    takes OTC stool softener   COPD (chronic obstructive pulmonary disease) (HCC)    "one dr says COPD; one dr says emphysema" (09/18/2017)   COVID-19 12/09/2019   DEPRESSION    takes Zoloft  and Doxepin  daily   Diverticulitis    DYSKINESIA, ESOPHAGUS    Essential hypertension        FIBROMYALGIA    GERD (gastroesophageal reflux disease)        Glaucoma    HYPERLIPIDEMIA    a. Intolerant to statins.   INSOMNIA    takes Ambien  nightly   Myocardial infarction [facility]    a. 2012 Myoview  notable for prior infarct;  b. 03/2015 Lexiscan  CL: EF 37%, diff HK, small area of inferior infarct from apex to base-->Med Rx.   O2 dependent    "2.5L q hs & prn" (09/18/2017)   Paroxysmal atrial fibrillation (HCC)    a. CHA2DS2VASc = 3--> takes Coumadin ;  b. 03/15/2015 Successful TEE/DCCV;  c. 03/2015 recurrent afib, Amio d/c'd in setting of hyperthyroidism.   Peripheral neuropathy    Pneumonia 12/2016   Rash and other nonspecific skin eruption 04/12/2009   no cause  found saw dermatologists x 2 and allergist   SLEEP APNEA, OBSTRUCTIVE    a. doesn't use CPAP   Syncope    a. 03/2015 s/p MDT LINQ.   Type II diabetes mellitus (HCC)        Assessment:  "I think I am getting stronger; using the home O2 and the walker all the time.   I am thinking now I should have gone to the rehab center like they recommended at the hospital- I am going to ask Dr. Autry Legions what he thinks when I see him on Thursday; I am also going to ask him if he thinks I need a referral to the wound clinic for this (L) leg wound- it's not looking much better than it did when I was released from the hospital.  The home health PT called me but I didn't feel good that day, so they are supposed to call me back this week to come out to the house this week.  I am mostly just resting a lot, not being very active because I feel so tired and have this chronic pain;"    Denies clinical concerns and sounds to be in no distress throughout [facility] 30-day program outreach call today  Patient Reported Symptoms: Cognitive Cognitive Status: Alert and  oriented to person, place, and time, Insightful and able to interpret abstract concepts, Normal speech and language skills Cognitive/Intellectual Conditions Management [RPT]: None reported or documented in medical history or problem list   Health Maintenance Behaviors: Annual physical exam, Healthy diet Health Facilitated by: Rest  Neurological Neurological Review of Symptoms: No symptoms reported    HEENT HEENT Symptoms Reported: No symptoms reported      Cardiovascular Cardiovascular Symptoms Reported: Swelling in legs or feet, Fatigue, Other: (sounds to be in no respiratory distress throughout TOC call today; denies shortness of breath; reports wearing home O2 "all the time") Other Cardiovascular Symptoms: "My legs still look bad, like raw meat on my (L) leg, and both are draining; I am going to ask Dr. Autry Legions for a referral to the wound clinic when I see him on  Thursday and I am also going to ask him if he thinks I should go ahead and go to the rehab facility- but I don't want to go if I don't have to" Does patient have uncontrolled Hypertension?: No Cardiovascular Conditions: Hypertension, Heart failure Cardiovascular Management Strategies: Medication therapy, Routine screening, Coping strategies, Adequate rest, Fluid modification, Weight management (home O2) Do You Have a Working Readable Scale?: Yes Weight: 277 lb (125.6 kg) (self-reported weight from 02/03/24: reports "has not taken weight today, have been too tired") Cardiovascular Self-Management Outcome: 3 (uncertain)  Respiratory Respiratory Symptoms Reported: Shortness of breath (sounds to be in no respiratory distress throughout TOC call today; denies shortness of breath at rest/ time of TOC call; reports wearing home O2 "all the time") Other Respiratory Symptoms: confirms using home O2 "all the time" Respiratory Conditions: COPD, Shortness of breath, Sleep disordered breathing  Endocrine Patient reports the following symptoms related to hypoglycemia or hyperglycemia : No symptoms reported Is patient diabetic?: Yes Is patient checking blood sugars at home?: No Endocrine Conditions: Diabetes Endocrine Management Strategies: Medication therapy, Diet modification, Routine screening, Fluid modification, Adequate rest  Gastrointestinal Gastrointestinal Symptoms Reported: No symptoms reported Additional Gastrointestinal Details: Reports "going to the bathroom normally"      Genitourinary Genitourinary Symptoms Reported: No symptoms reported Additional Genitourinary Details: Reports "going to the bathroom normally"    Integumentary Integumentary Symptoms Reported: Wound Additional Integumentary Details: Reports "my (L) leg looks like raw meat, and there is some drainage from both of my legs; I am going to ask Dr. Autry Legions if he thinks I need to get a referral to the wound clinic; right now we are just  using the medicine the way they told us  to at the heart failure clinic" Skin Conditions: Wound Skin Management Strategies: Medication therapy, Weight management, Fluid modification, Routine screening, Adequate rest, Dressing changes  Musculoskeletal Musculoskelatal Symptoms Reviewed: Difficulty walking, Unsteady gait, Weakness Additional Musculoskeletal Details: Reportsusing walker "all the time" Musculoskeletal Conditions: Mobility limited, Unsteady gait, Other Other Musculoskeletal Conditions: Chronic pain Musculoskeletal Management Strategies: Adequate rest, Diet modification, Routine screening, Medication therapy, Medical device Falls in the past year?: Yes Number of falls in past year: 2 or more Was there an injury with Fall?: Yes Fall Risk Category Calculator: 3 Patient Fall Risk Level: High Fall Risk Patient at Risk for Falls Due to: Impaired balance/gait, History of fall(s), Medication side effect, Impaired mobility, Other (Comment) (chronic pain) Fall risk Follow up: Falls prevention discussed, Education provided  Psychosocial Psychosocial Symptoms Reported: No symptoms reported         There were no vitals filed for this visit.  Medications Reviewed Today     Reviewed by  Pennelope Basque M, RN (Registered Nurse) on 02/04/24 at 1336  Med List Status: <None>   Medication Order Taking? Sig Documenting Provider Last Dose Status Informant  albuterol  (PROVENTIL ) (2.5 MG/3ML) 0.083% nebulizer solution [id number] No Take 3 mLs (2.5 mg total) by nebulization every 6 (six) hours as needed for wheezing or shortness of breath. Quillian Brunt, MD Taking Active Pharmacy Records, Other  albuterol  (VENTOLIN  HFA) 108 [id number] Base) MCG/ACT inhaler [id number] No INHALE 1-2 PUFFS BY MOUTH EVERY 6 HOURS AS NEEDED FOR WHEEZE OR SHORTNESS OF Jeppie Moles, MD Taking Active Pharmacy Records, Other  ALPRAZolam  (XANAX ) 0.5 MG tablet [id number] No TAKE 2 TABLETS (1 MG TOTAL) BY MOUTH EVERY DAY AT  BEDTIME Roslyn Coombe, MD Taking Active Pharmacy Records, Other  ammonium lactate  (AMLACTIN) 12 % cream [id number] No Apply 1 Application topically as needed for dry skin. Floyce Hutching, DPM Taking Active Self, Pharmacy Records, Other  apixaban  (ELIQUIS ) 5 MG TABS tablet [id number] No Take 1 tablet (5 mg total) by mouth 2 (two) times daily. Darlis Eisenmenger, MD Taking Active Pharmacy Records, Other           Med Note (ADSIDE, Mardene Shake M   Mon Feb 03, 2024  3:32 PM)    arformoterol  (BROVANA ) 15 MCG/2ML NEBU [id number] No Take 2 mLs (15 mcg total) by nebulization every 12 (twelve) hours. Raejean Bullock, NP Taking Active   ascorbic acid (VITAMIN C) 500 MG tablet [id number] No Take 500 mg by mouth daily. [provider] Taking Active Pharmacy Records, Other  augmented betamethasone  dipropionate (DIPROLENE -AF) 0.05 % cream [id number] No Apply 1 application  topically daily as needed (to affected areas- for itching). [provider] Taking Active Self, Pharmacy Records, Other  carisoprodol  (SOMA ) 350 MG tablet [id number] No Take 1 tablet (350 mg total) by mouth 4 (four) times daily. Roslyn Coombe, MD Taking Active Pharmacy Records, Other  Cholecalciferol  (VITAMIN D3) 1000 units CAPS [id number] No Take 1,000 Units by mouth daily. [provider] Taking Active Self, Pharmacy Records, Other  clotrimazole -betamethasone  (LOTRISONE ) cream [id number] No Apply 1 Application topically daily. Abram Abraham, NP-C Taking Active Self, Pharmacy Records, Other  doxepin  (SINEQUAN ) 10 MG capsule [id number] No TAKE 2 CAPSULES BY MOUTH AT BEDTIME AS NEEDED Roslyn Coombe, MD Taking Active Pharmacy Records, Other  DULoxetine  (CYMBALTA ) 60 MG capsule [id number] No TAKE 1 CAPSULE BY MOUTH EVERY DAY Roslyn Coombe, MD Taking Active Pharmacy Records, Other  Dupilumab (DUPIXENT) 300 MG/2ML SOPN [id number] No Inject 300 mg into the skin every 14 (fourteen) days. [provider] Taking Active Self, Pharmacy  Records, Other           Med Note Alfredo Inch   Tue Mar 12, 2023  1:41 PM)    Evolocumab  (REPATHA  SURECLICK) 140 MG/ML SOAJ [id number] No Inject 140 mg into the skin every 14 (fourteen) days. Darlis Eisenmenger, MD Taking Active Self, Pharmacy Records, Other  fluconazole  (DIFLUCAN ) 100 MG tablet [id number] No Take 1 tablet (100 mg total) by mouth daily. Roslyn Coombe, MD Taking Active Pharmacy Records, Other  fluticasone  (FLONASE ) 50 MCG/ACT nasal spray [id number] No Place 2 sprays into both nostrils daily as needed for allergies or rhinitis. Roslyn Coombe, MD Taking Active Pharmacy Records, Other  fluticasone -salmeterol [facility] INHUB) 250-50 MCG/ACT AEPB [id number] No Inhale 1 puff into the lungs in the morning and at bedtime. Quillian Brunt, MD Taking Active Pharmacy Records, Other  gabapentin  (NEURONTIN ) 100 MG capsule [id number]  No Take 1 capsule (100 mg total) by mouth 3 (three) times daily. Reina Cara, DPM Taking Active Pharmacy Records, Other  hydrOXYzine  (ATARAX ) 10 MG tablet [id number] No TAKE 1 TABLET BY MOUTH 3 TIMES DAILY AS NEEDED FOR ITCHING. Strength: 10 mg John, James W, MD Taking Active Self, Pharmacy Records, Other           Med Note Loistine Rinne, Leita Purdue Sep 04, 2023  2:56 PM)    ipratropium-albuterol  (DUONEB) 0.5-2.5 (3) MG/3ML SOLN [id number] No TAKE 3 ML BY NEBULIZATION EVERY 4 HOURS AS NEEDED (WHEEZING). Roslyn Coombe, MD Taking Active Self, Pharmacy Records, Other  ketoconazole  (NIZORAL ) 2 % cream [id number] No Apply 1 Application topically daily. Roslyn Coombe, MD Taking Active Pharmacy Records, Other  losartan  (COZAAR ) 25 MG tablet [id number] No Take 1 tablet (25 mg total) by mouth daily. Horace Lye, PA-C Taking Active Self, Pharmacy Records, Other  nystatin  (MYCOSTATIN /NYSTOP ) powder [id number] No Use as directed twice per day as needed to the affected area Roslyn Coombe, MD Taking Active Pharmacy Records, Other  omeprazole  (PRILOSEC) 20 MG capsule [id number]  No TAKE 1 CAPSULE BY MOUTH TWICE A DAY Roslyn Coombe, MD Taking Active Pharmacy Records, Other  potassium chloride  SA (KLOR-CON  M20) 20 MEQ tablet [id number] No Take 1 tablet (20 mEq total) by mouth daily. TAKE (2 TABLETS) IN THE MORNING  Patient taking differently: Take 20 mEq by mouth daily. TAKE 20 MEQ (1 TABLETS) IN THE MORNING   Darlis Eisenmenger, MD Taking Active Pharmacy Records, Other  predniSONE  (DELTASONE ) 10 MG tablet [id number] No TAKE 1 TABLET (10 MG TOTAL) BY MOUTH DAILY WITH BREAKFAST. Isidro Margo, DO Taking Active Pharmacy Records, Other  silver  sulfADIAZINE  (SILVADENE ) 1 % cream [id number] No APPLY TOPICALLY TO AFFECTED AREA EVERY DAY Roslyn Coombe, MD Taking Active Self, Pharmacy Records, Other           Med Note Rayetta Cain Sep 04, 2023  2:58 PM)    tamsulosin  (FLOMAX ) 0.4 MG CAPS capsule [id number] No TAKE 1 CAPSULE BY MOUTH EVERY DAY Roslyn Coombe, MD Taking Active Self, Pharmacy Records, Other  Tiotropium Bromide  Monohydrate (SPIRIVA  RESPIMAT) 2.5 MCG/ACT AERS [id number] No Take 2 puffs by nebulization daily. Raejean Bullock, NP Taking Active   tirzepatide [facility]) 5 MG/0.5ML Pen [id number] No Inject 5 mg into the skin once a week. Roslyn Coombe, MD Taking Active   tiZANidine  (ZANAFLEX ) 4 MG tablet [id number] No Take 4 mg by mouth 3 (three) times daily. [provider] Taking Active Pharmacy Records, Other  torsemide  (DEMADEX ) 20 MG tablet [id number]  Take 5 tablets (100 mg total) by mouth 2 (two) times daily. [place], Arlice Bene, [place]  Active   traMADol  (ULTRAM ) 50 MG tablet [id number]  Take 1 tablet (50 mg total) by mouth every 6 (six) hours as needed. Roslyn Coombe, MD  Active   traZODone  (DESYREL ) 100 MG tablet [id number] No Take 200 mg by mouth at bedtime as needed. [provider] Taking Active Pharmacy Records, Other  TUMS 500 MG chewable tablet [id number] No Chew 500-2,000 mg by mouth every 4 (four) hours as needed for indigestion or  heartburn. [provider] Taking Active Self, Pharmacy Records, Other           Med Note Ivor Mars, Annabell Baron   Mon Feb 18, 2023  1:29 PM)    valACYclovir  (VALTREX ) 1000 MG tablet [id number] No Take 1 tablet (1,000  mg total) by mouth 3 (three) times daily. Roslyn Coombe, MD Taking Active Pharmacy Records, Other  venlafaxine  XR (EFFEXOR -XR) 150 MG 24 hr capsule [id number] No TAKE 1 CAPSULE BY MOUTH DAILY WITH BREAKFAST. Roslyn Coombe, MD Taking Active Pharmacy Records, Other  zolpidem  (AMBIEN ) 10 MG tablet [id number] No Take 1 tablet (10 mg total) by mouth at bedtime as needed for sleep. Roslyn Coombe, MD Taking Active Pharmacy Records, Other           Recommendation:   PCP Follow-up- as scheduled 02/06/24 CHF clinic provider office visit - as scheduled 02/10/24 Hand-off report for RN CM coverage for week # 3 call- completed  Follow Up Plan:   Telephone follow-up in 1 week  Plan for next week's call: Review daily weights since Tuesday 02/04/24 Review home health PT visits- did patient call agency to re-schedule his PT visits? Review PCP provider office visit 02/06/24 Reinforce education re: self health management of CHF/ daily weights Review medication adherence/ any medication changes post- PCP office visit on 02/06/24 and CHF provider office visit on 02/10/24 Find out if patient discussed need for short-term rehabilitation admission and wound clinic referral for (L) leg skin tear   Please see TOC- 30 day program plan of care for additional information/ interventions  Total time spent from review to signing of note/ including any care coordination interventions:  86 minutes  Pls call/ message for questions,  Tequita Marrs Mckinney Maryse Brierley, RN, BSN, CCRN Alumnus RN Care Manager  Transitions of Care  [facility] [phone]: direct office

## 2024-02-04 NOTE — Patient Instructions (Signed)
 Visit Information  Thank you for taking time to visit with me today. Please don't hesitate to contact me if I can be of assistance to you before our next scheduled telephone appointment.  Your next appointment is by telephone on Tuesday 02/11/24 at 1:30 pm with nurse Blanca Bunch Nurse Tawny Fate will call for telephone appointment on Tuesday 02/18/24 at 1:30 pm  Please call the care guide team at [phone] if you need to cancel or reschedule your appointment.   Following are the goals we discussed today:  Patient Self Care Activities:  Attend all scheduled provider appointments Call pharmacy for medication refills 3-7 days in advance of running out of medications Call provider office for new concerns or questions  Participate in Transition of Care Program/Attend TOC scheduled calls Take medications as prescribed   Use assistive devices as needed to prevent falls- walker Continue using home oxygen  as prescribed Please call the home health agency to schedule your home visits with the PT that called you last week: Gasper Karst [phone] Weigh yourself every day to stay on top of early fluid retention: write down your weights every day so you remember what it is from day to day: follow the weight-gain guidelines and action plan to call your doctor if you gain more than 3 lbs overnight, or 5 lbs in one week Take all recorded weights from home monitoring to upcoming cardiology CHF provider office visit on 02/10/24 for your doctor to review If you believe your condition is getting worse- contact your care providers (doctors) promptly- reaching out to your doctor early when you have concerns can prevent you from having to go to the hospital  If you are experiencing a Mental Health or Behavioral Health Crisis or need someone to talk to, please  call the Suicide and Crisis Lifeline: 988 call the [place]  National Suicide Prevention Lifeline: 248-362-3603 or TTY: (408) 152-7629 TTY 803-151-5888) to talk to a trained  counselor call 1-800-273-TALK (toll free, 24 hour hotline) go to [facility] [address], [place] [phone]) call the [facility] Crisis Line: 762-383-8121 call 911   Patient verbalizes understanding of instructions and care plan provided today and agrees to view in MyChart. Active MyChart status and patient understanding of how to access instructions and care plan via MyChart confirmed with patient.     Rhyatt Muska Mckinney Jalea Bronaugh, RN, BSN, Media planner  Transitions of Care  [facility] [phone]: direct office

## 2024-02-04 NOTE — Addendum Note (Signed)
 Addended by: Roslyn Coombe on: 02/04/2024 09:41 AM   Modules accepted: Orders

## 2024-02-05 ENCOUNTER — Telehealth: Payer: Self-pay | Admitting: *Deleted

## 2024-02-05 NOTE — Progress Notes (Signed)
 Complex Care Management Note  Care Guide Note 02/05/2024 Name: [name] MRN: [id number] DOB: Dec 10, 1955  [name] is a 68 y.o. year old male who sees Roslyn Coombe, MD for primary care. I reached out to [name] by phone today to offer complex care management services.  Mr. Dunlevy was given information about Complex Care Management services today including:   The Complex Care Management services include support from the care team which includes your Nurse Care Manager, Clinical Social Worker, or Pharmacist.  The Complex Care Management team is here to help remove barriers to the health concerns and goals most important to you. Complex Care Management services are voluntary, and the patient may decline or stop services at any time by request to their care team member.   Complex Care Management Consent Status: Patient agreed to services and verbal consent obtained.   Follow up plan:  Telephone appointment with complex care management team member scheduled for:  02/14/2024  Encounter Outcome:  Patient Scheduled  Kandis Ormond, CMA [place]  [facility], [facility] Guide Direct Dial: [phone]  Fax: [phone] Website: [email or web address]

## 2024-02-06 ENCOUNTER — Inpatient Hospital Stay: Admitting: Internal Medicine

## 2024-02-06 ENCOUNTER — Emergency Department (HOSPITAL_COMMUNITY)

## 2024-02-06 ENCOUNTER — Inpatient Hospital Stay (HOSPITAL_COMMUNITY)
Admission: EM | Admit: 2024-02-06 | Discharge: 2024-02-13 | DRG: 871 | Disposition: A | Discharge status: Home / Self Care | Attending: Student | Admitting: Student

## 2024-02-06 ENCOUNTER — Other Ambulatory Visit: Payer: Self-pay

## 2024-02-06 ENCOUNTER — Ambulatory Visit: Payer: Self-pay

## 2024-02-06 ENCOUNTER — Encounter (HOSPITAL_COMMUNITY): Payer: Self-pay | Admitting: Emergency Medicine

## 2024-02-06 DIAGNOSIS — I110 Hypertensive heart disease with heart failure: Secondary | ICD-10-CM | POA: Diagnosis present

## 2024-02-06 DIAGNOSIS — R5383 Other fatigue: Secondary | ICD-10-CM

## 2024-02-06 DIAGNOSIS — G4700 Insomnia, unspecified: Secondary | ICD-10-CM | POA: Diagnosis present

## 2024-02-06 DIAGNOSIS — Z8701 Personal history of pneumonia (recurrent): Secondary | ICD-10-CM

## 2024-02-06 DIAGNOSIS — I493 Ventricular premature depolarization: Secondary | ICD-10-CM | POA: Diagnosis present

## 2024-02-06 DIAGNOSIS — E11649 Type 2 diabetes mellitus with hypoglycemia without coma: Secondary | ICD-10-CM | POA: Diagnosis present

## 2024-02-06 DIAGNOSIS — I4819 Other persistent atrial fibrillation: Secondary | ICD-10-CM

## 2024-02-06 DIAGNOSIS — G4733 Obstructive sleep apnea (adult) (pediatric): Secondary | ICD-10-CM | POA: Diagnosis not present

## 2024-02-06 DIAGNOSIS — R6521 Severe sepsis with septic shock: Secondary | ICD-10-CM | POA: Diagnosis not present

## 2024-02-06 DIAGNOSIS — I429 Cardiomyopathy, unspecified: Secondary | ICD-10-CM | POA: Diagnosis not present

## 2024-02-06 DIAGNOSIS — Z888 Allergy status to other drugs, medicaments and biological substances status: Secondary | ICD-10-CM

## 2024-02-06 DIAGNOSIS — Z79899 Other long term (current) drug therapy: Secondary | ICD-10-CM

## 2024-02-06 DIAGNOSIS — M6281 Muscle weakness (generalized): Secondary | ICD-10-CM | POA: Diagnosis not present

## 2024-02-06 DIAGNOSIS — R6881 Early satiety: Secondary | ICD-10-CM | POA: Diagnosis present

## 2024-02-06 DIAGNOSIS — D849 Immunodeficiency, unspecified: Secondary | ICD-10-CM | POA: Diagnosis present

## 2024-02-06 DIAGNOSIS — J4489 Other specified chronic obstructive pulmonary disease: Secondary | ICD-10-CM | POA: Diagnosis present

## 2024-02-06 DIAGNOSIS — S81812A Laceration without foreign body, left lower leg, initial encounter: Secondary | ICD-10-CM | POA: Diagnosis present

## 2024-02-06 DIAGNOSIS — K922 Gastrointestinal hemorrhage, unspecified: Secondary | ICD-10-CM | POA: Diagnosis present

## 2024-02-06 DIAGNOSIS — S81802D Unspecified open wound, left lower leg, subsequent encounter: Secondary | ICD-10-CM

## 2024-02-06 DIAGNOSIS — L03115 Cellulitis of right lower limb: Secondary | ICD-10-CM | POA: Diagnosis present

## 2024-02-06 DIAGNOSIS — E2740 Unspecified adrenocortical insufficiency: Secondary | ICD-10-CM | POA: Diagnosis present

## 2024-02-06 DIAGNOSIS — I255 Ischemic cardiomyopathy: Secondary | ICD-10-CM

## 2024-02-06 DIAGNOSIS — G243 Spasmodic torticollis: Secondary | ICD-10-CM | POA: Diagnosis present

## 2024-02-06 DIAGNOSIS — G894 Chronic pain syndrome: Secondary | ICD-10-CM | POA: Diagnosis present

## 2024-02-06 DIAGNOSIS — G8929 Other chronic pain: Secondary | ICD-10-CM | POA: Diagnosis present

## 2024-02-06 DIAGNOSIS — R41841 Cognitive communication deficit: Secondary | ICD-10-CM | POA: Diagnosis not present

## 2024-02-06 DIAGNOSIS — I509 Heart failure, unspecified: Secondary | ICD-10-CM | POA: Diagnosis not present

## 2024-02-06 DIAGNOSIS — I959 Hypotension, unspecified: Secondary | ICD-10-CM | POA: Diagnosis not present

## 2024-02-06 DIAGNOSIS — E8729 Other acidosis: Secondary | ICD-10-CM | POA: Diagnosis present

## 2024-02-06 DIAGNOSIS — I5020 Unspecified systolic (congestive) heart failure: Secondary | ICD-10-CM | POA: Diagnosis not present

## 2024-02-06 DIAGNOSIS — A419 Sepsis, unspecified organism: Secondary | ICD-10-CM | POA: Diagnosis not present

## 2024-02-06 DIAGNOSIS — Z96653 Presence of artificial knee joint, bilateral: Secondary | ICD-10-CM | POA: Diagnosis present

## 2024-02-06 DIAGNOSIS — Z6841 Body Mass Index (BMI) 40.0 and over, adult: Secondary | ICD-10-CM

## 2024-02-06 DIAGNOSIS — M869 Osteomyelitis, unspecified: Secondary | ICD-10-CM | POA: Diagnosis not present

## 2024-02-06 DIAGNOSIS — M129 Arthropathy, unspecified: Secondary | ICD-10-CM | POA: Diagnosis not present

## 2024-02-06 DIAGNOSIS — I4821 Permanent atrial fibrillation: Secondary | ICD-10-CM | POA: Diagnosis present

## 2024-02-06 DIAGNOSIS — I10 Essential (primary) hypertension: Secondary | ICD-10-CM | POA: Diagnosis not present

## 2024-02-06 DIAGNOSIS — N400 Enlarged prostate without lower urinary tract symptoms: Secondary | ICD-10-CM | POA: Diagnosis present

## 2024-02-06 DIAGNOSIS — Z7901 Long term (current) use of anticoagulants: Secondary | ICD-10-CM

## 2024-02-06 DIAGNOSIS — Z825 Family history of asthma and other chronic lower respiratory diseases: Secondary | ICD-10-CM

## 2024-02-06 DIAGNOSIS — L02416 Cutaneous abscess of left lower limb: Secondary | ICD-10-CM | POA: Diagnosis not present

## 2024-02-06 DIAGNOSIS — R0689 Other abnormalities of breathing: Secondary | ICD-10-CM | POA: Diagnosis not present

## 2024-02-06 DIAGNOSIS — Z96652 Presence of left artificial knee joint: Secondary | ICD-10-CM | POA: Diagnosis not present

## 2024-02-06 DIAGNOSIS — R0902 Hypoxemia: Secondary | ICD-10-CM | POA: Diagnosis not present

## 2024-02-06 DIAGNOSIS — Z8616 Personal history of COVID-19: Secondary | ICD-10-CM | POA: Diagnosis not present

## 2024-02-06 DIAGNOSIS — L03116 Cellulitis of left lower limb: Secondary | ICD-10-CM | POA: Diagnosis present

## 2024-02-06 DIAGNOSIS — Z7401 Bed confinement status: Secondary | ICD-10-CM | POA: Diagnosis not present

## 2024-02-06 DIAGNOSIS — R34 Anuria and oliguria: Secondary | ICD-10-CM | POA: Diagnosis not present

## 2024-02-06 DIAGNOSIS — I4891 Unspecified atrial fibrillation: Secondary | ICD-10-CM | POA: Diagnosis not present

## 2024-02-06 DIAGNOSIS — R571 Hypovolemic shock: Secondary | ICD-10-CM | POA: Diagnosis not present

## 2024-02-06 DIAGNOSIS — Z87891 Personal history of nicotine dependence: Secondary | ICD-10-CM

## 2024-02-06 DIAGNOSIS — Z91048 Other nonmedicinal substance allergy status: Secondary | ICD-10-CM

## 2024-02-06 DIAGNOSIS — R531 Weakness: Secondary | ICD-10-CM

## 2024-02-06 DIAGNOSIS — I2510 Atherosclerotic heart disease of native coronary artery without angina pectoris: Secondary | ICD-10-CM | POA: Diagnosis present

## 2024-02-06 DIAGNOSIS — R443 Hallucinations, unspecified: Secondary | ICD-10-CM | POA: Diagnosis present

## 2024-02-06 DIAGNOSIS — E785 Hyperlipidemia, unspecified: Secondary | ICD-10-CM | POA: Diagnosis present

## 2024-02-06 DIAGNOSIS — Z7952 Long term (current) use of systemic steroids: Secondary | ICD-10-CM

## 2024-02-06 DIAGNOSIS — I428 Other cardiomyopathies: Secondary | ICD-10-CM | POA: Diagnosis present

## 2024-02-06 DIAGNOSIS — R1314 Dysphagia, pharyngoesophageal phase: Secondary | ICD-10-CM | POA: Diagnosis not present

## 2024-02-06 DIAGNOSIS — I5022 Chronic systolic (congestive) heart failure: Secondary | ICD-10-CM | POA: Diagnosis present

## 2024-02-06 DIAGNOSIS — M6259 Muscle wasting and atrophy, not elsewhere classified, multiple sites: Secondary | ICD-10-CM | POA: Diagnosis not present

## 2024-02-06 DIAGNOSIS — I252 Old myocardial infarction: Secondary | ICD-10-CM

## 2024-02-06 DIAGNOSIS — R5381 Other malaise: Secondary | ICD-10-CM | POA: Diagnosis present

## 2024-02-06 DIAGNOSIS — Z9981 Dependence on supplemental oxygen: Secondary | ICD-10-CM

## 2024-02-06 DIAGNOSIS — F419 Anxiety disorder, unspecified: Secondary | ICD-10-CM | POA: Diagnosis present

## 2024-02-06 DIAGNOSIS — F32A Depression, unspecified: Secondary | ICD-10-CM | POA: Diagnosis present

## 2024-02-06 DIAGNOSIS — Z886 Allergy status to analgesic agent status: Secondary | ICD-10-CM

## 2024-02-06 DIAGNOSIS — R1311 Dysphagia, oral phase: Secondary | ICD-10-CM | POA: Diagnosis not present

## 2024-02-06 DIAGNOSIS — J9611 Chronic respiratory failure with hypoxia: Secondary | ICD-10-CM | POA: Diagnosis present

## 2024-02-06 DIAGNOSIS — R001 Bradycardia, unspecified: Secondary | ICD-10-CM | POA: Diagnosis not present

## 2024-02-06 DIAGNOSIS — M7989 Other specified soft tissue disorders: Secondary | ICD-10-CM | POA: Diagnosis not present

## 2024-02-06 DIAGNOSIS — M797 Fibromyalgia: Secondary | ICD-10-CM | POA: Diagnosis present

## 2024-02-06 DIAGNOSIS — N170 Acute kidney failure with tubular necrosis: Secondary | ICD-10-CM | POA: Diagnosis present

## 2024-02-06 DIAGNOSIS — Z7985 Long-term (current) use of injectable non-insulin antidiabetic drugs: Secondary | ICD-10-CM

## 2024-02-06 DIAGNOSIS — J438 Other emphysema: Secondary | ICD-10-CM | POA: Diagnosis not present

## 2024-02-06 DIAGNOSIS — Z882 Allergy status to sulfonamides status: Secondary | ICD-10-CM

## 2024-02-06 DIAGNOSIS — J449 Chronic obstructive pulmonary disease, unspecified: Secondary | ICD-10-CM | POA: Diagnosis present

## 2024-02-06 DIAGNOSIS — R2681 Unsteadiness on feet: Secondary | ICD-10-CM | POA: Diagnosis not present

## 2024-02-06 DIAGNOSIS — R0600 Dyspnea, unspecified: Secondary | ICD-10-CM | POA: Diagnosis not present

## 2024-02-06 DIAGNOSIS — Z83719 Family history of colon polyps, unspecified: Secondary | ICD-10-CM

## 2024-02-06 DIAGNOSIS — Z95810 Presence of automatic (implantable) cardiac defibrillator: Secondary | ICD-10-CM | POA: Diagnosis not present

## 2024-02-06 DIAGNOSIS — R600 Localized edema: Secondary | ICD-10-CM | POA: Diagnosis not present

## 2024-02-06 DIAGNOSIS — N179 Acute kidney failure, unspecified: Secondary | ICD-10-CM | POA: Diagnosis not present

## 2024-02-06 LAB — CBC WITH DIFFERENTIAL/PLATELET
Abs Immature Granulocytes: 0 10*3/uL (ref 0.00–0.07)
Basophils Absolute: 0 10*3/uL (ref 0.0–0.1)
Basophils Relative: 0 %
Eosinophils Absolute: 0 10*3/uL (ref 0.0–0.5)
Eosinophils Relative: 0 %
HCT: 36.3 % — ABNORMAL LOW (ref 39.0–52.0)
Hemoglobin: 10.2 g/dL — ABNORMAL LOW (ref 13.0–17.0)
Lymphocytes Relative: 3 %
Lymphs Abs: 0.6 10*3/uL — ABNORMAL LOW (ref 0.7–4.0)
MCH: 30.3 pg (ref 26.0–34.0)
MCHC: 28.1 g/dL — ABNORMAL LOW (ref 30.0–36.0)
MCV: 107.7 fL — ABNORMAL HIGH (ref 80.0–100.0)
Monocytes Absolute: 0.2 10*3/uL (ref 0.1–1.0)
Monocytes Relative: 1 %
Neutro Abs: 20 10*3/uL — ABNORMAL HIGH (ref 1.7–7.7)
Neutrophils Relative %: 96 %
Platelets: 233 10*3/uL (ref 150–400)
RBC: 3.37 MIL/uL — ABNORMAL LOW (ref 4.22–5.81)
RDW: 15.5 % (ref 11.5–15.5)
WBC: 20.8 10*3/uL — ABNORMAL HIGH (ref 4.0–10.5)
nRBC: 0 % (ref 0.0–0.2)
nRBC: 0 /100{WBCs}

## 2024-02-06 LAB — COMPREHENSIVE METABOLIC PANEL WITH GFR
ALT: 28 U/L (ref 0–44)
AST: 25 U/L (ref 15–41)
Albumin: 2.8 g/dL — ABNORMAL LOW (ref 3.5–5.0)
Alkaline Phosphatase: 67 U/L (ref 38–126)
Anion gap: 12 (ref 5–15)
BUN: 31 mg/dL — ABNORMAL HIGH (ref 8–23)
CO2: 23 mmol/L (ref 22–32)
Calcium: 8.4 mg/dL — ABNORMAL LOW (ref 8.9–10.3)
Chloride: 101 mmol/L (ref 98–111)
Creatinine, Ser: 1.89 mg/dL — ABNORMAL HIGH (ref 0.61–1.24)
GFR, Estimated: 38 mL/min — ABNORMAL LOW (ref >60)
Glucose, Bld: 71 mg/dL (ref 70–99)
Potassium: 3.8 mmol/L (ref 3.5–5.1)
Sodium: 136 mmol/L (ref 135–145)
Total Bilirubin: 0.8 mg/dL (ref 0.0–1.2)
Total Protein: 5.1 g/dL — ABNORMAL LOW (ref 6.5–8.1)

## 2024-02-06 LAB — BLOOD GAS, VENOUS
Acid-Base Excess: 0.1 mmol/L (ref 0.0–2.0)
Bicarbonate: 27.6 mmol/L (ref 20.0–28.0)
Drawn by: 67333
O2 Saturation: 69.3 %
Patient temperature: 36.8
pCO2, Ven: 55 mmHg (ref 44–60)
pH, Ven: 7.3 (ref 7.25–7.43)
pO2, Ven: 38 mmHg (ref 32–45)

## 2024-02-06 LAB — CBG MONITORING, ED: Glucose-Capillary: 70 mg/dL (ref 70–99)

## 2024-02-06 LAB — TROPONIN I (HIGH SENSITIVITY)
Troponin I (High Sensitivity): 21 ng/L — ABNORMAL HIGH (ref <18)
Troponin I (High Sensitivity): 23 ng/L — ABNORMAL HIGH (ref <18)

## 2024-02-06 LAB — GLUCOSE, CAPILLARY: Glucose-Capillary: 70 mg/dL (ref 70–99)

## 2024-02-06 LAB — I-STAT CG4 LACTIC ACID, ED: Lactic Acid, Venous: 2.5 mmol/L (ref 0.5–1.9)

## 2024-02-06 MED ORDER — SODIUM CHLORIDE 0.9 % IV SOLN
2.0000 g | Freq: Once | INTRAVENOUS | Status: AC
  Administered 2024-02-06: 2 g via INTRAVENOUS
  Filled 2024-02-06: qty 12.5

## 2024-02-06 MED ORDER — LACTATED RINGERS IV BOLUS: 500.0000 mL | Freq: Once | INTRAVENOUS | Status: DC

## 2024-02-06 MED ORDER — ONDANSETRON HCL 4 MG/2ML IJ SOLN
4.0000 mg | Freq: Four times a day (QID) | INTRAMUSCULAR | Status: DC | PRN
  Administered 2024-02-07: 4 mg via INTRAVENOUS
  Filled 2024-02-06: qty 2

## 2024-02-06 MED ORDER — CHLORHEXIDINE GLUCONATE CLOTH 2 % EX PADS
6.0000 | MEDICATED_PAD | Freq: Every day | CUTANEOUS | Status: DC
  Administered 2024-02-07 – 2024-02-13 (×7): 6 via TOPICAL

## 2024-02-06 MED ORDER — INSULIN ASPART 100 UNIT/ML IJ SOLN: 0.0000 [IU] | INTRAMUSCULAR | Status: DC

## 2024-02-06 MED ORDER — LACTATED RINGERS IV BOLUS
500.0000 mL | Freq: Once | INTRAVENOUS | Status: AC
  Administered 2024-02-06: 500 mL via INTRAVENOUS

## 2024-02-06 MED ORDER — ACETAMINOPHEN 325 MG PO TABS
650.0000 mg | ORAL_TABLET | ORAL | Status: AC | PRN
  Administered 2024-02-08: 650 mg via ORAL
  Filled 2024-02-06: qty 2

## 2024-02-06 MED ORDER — ORAL CARE MOUTH RINSE: 15.0000 mL | OROMUCOSAL | Status: DC | PRN

## 2024-02-06 MED ORDER — SODIUM CHLORIDE 0.9 % IV SOLN: 250.0000 mL | INTRAVENOUS | Status: AC

## 2024-02-06 MED ORDER — OXYCODONE-ACETAMINOPHEN 5-325 MG PO TABS
1.0000 | ORAL_TABLET | Freq: Once | ORAL | Status: AC
  Administered 2024-02-06: 1 via ORAL
  Filled 2024-02-06: qty 1

## 2024-02-06 MED ORDER — NOREPINEPHRINE 4 MG/250ML-% IV SOLN
0.0000 ug/min | INTRAVENOUS | Status: DC
  Administered 2024-02-06: 2 ug/min via INTRAVENOUS
  Filled 2024-02-06: qty 250

## 2024-02-06 MED ORDER — VANCOMYCIN HCL 2000 MG/400ML IV SOLN
2000.0000 mg | Freq: Once | INTRAVENOUS | Status: AC
  Administered 2024-02-06: 2000 mg via INTRAVENOUS
  Filled 2024-02-06: qty 400

## 2024-02-06 MED ORDER — OXYCODONE HCL 5 MG PO TABS
5.0000 mg | ORAL_TABLET | ORAL | Status: AC | PRN
  Administered 2024-02-06 – 2024-02-09 (×10): 5 mg via ORAL
  Filled 2024-02-06 (×10): qty 1

## 2024-02-06 MED ORDER — SODIUM CHLORIDE 0.9 % IV SOLN
2.0000 g | Freq: Two times a day (BID) | INTRAVENOUS | Status: DC
  Administered 2024-02-07 – 2024-02-09 (×6): 2 g via INTRAVENOUS
  Filled 2024-02-06 (×6): qty 12.5

## 2024-02-06 MED ORDER — LACTATED RINGERS IV BOLUS
1000.0000 mL | Freq: Once | INTRAVENOUS | Status: AC
  Administered 2024-02-06: 1000 mL via INTRAVENOUS

## 2024-02-06 MED ORDER — NOREPINEPHRINE 4 MG/250ML-% IV SOLN
2.0000 ug/min | INTRAVENOUS | Status: DC
  Administered 2024-02-06: 6 ug/min via INTRAVENOUS
  Administered 2024-02-07: 8 ug/min via INTRAVENOUS
  Filled 2024-02-06: qty 250

## 2024-02-06 NOTE — H&P (Signed)
 NAME:  [name], MRN:  [id number], DOB:  1956/05/11, LOS: 0 ADMISSION DATE:  02/06/2024, CONSULTATION DATE:  02/06/2024 REFERRING MD:  Paris Bolds, MD, CHIEF COMPLAINT:  Weakness   History of Present Illness:  68 y/o male with PMH for COPD now on 2 liters home O2 (started from last admission 2 weeks ago), OSA-not tolerant to PAP, Cervical spine fracture, Cardiomyopathy EF 40%,  s/p BiV ICD, Atrial Fibrillation on Eliquis , Peripheral Neuropathy and multiple other medical issues who was just d/c from [facility] ICU about 2 weeks ago and he was admitted at that time for Septic shock for viral gastro-enteritis with diarrhea, Syncopal episode and had AKI, requiring vasopressors.  He was d/c home and says he was able to walk with a walker and his girlfriend was helping to take care of him. He now returns with generalized weakness starting this morning.  He was not able to get up from a chair.   During his ED stay he became hypotensive requiring IV Vasopressors.  He was noted to have an AKI with Cr 1.89 from 0.99 3 days before.  His WBC was 20.8, Trop 21, (BNP 56 on 4/21). LLE noted to be warm/red and painful. Pertinent  Medical History  AICD (automatic cardioverter/defibrillator) present, Anemia, ANXIETY, Arthritis, Asthma, Cardiomyopathy (HCC), Chronic constipation, COPD (chronic obstructive pulmonary disease) (HCC), COVID-19 (12/09/2019), DEPRESSION, Diverticulitis, DYSKINESIA, ESOPHAGUS, Essential hypertension, FIBROMYALGIA, GERD (gastroesophageal reflux disease), Glaucoma, HYPERLIPIDEMIA, INSOMNIA, Myocardial infarction (HCC), O2 dependent, Paroxysmal atrial fibrillation (HCC), Peripheral neuropathy, Pneumonia (12/2016), Rash and other nonspecific skin eruption (04/12/2009), SLEEP APNEA, OBSTRUCTIVE, Syncope, and Type II diabetes mellitus (HCC).   Significant Hospital Events: Including procedures, antibiotic start and stop dates in addition to other pertinent events   4/24: Admit to ICU for Septic  Shock, LLE cellulitis  Interim History / Subjective:  N/a  Objective   Blood pressure (!) 62/48, pulse 71, temperature 98.7 F (37.1 C), resp. rate (!) 21, height 5\' 11"  (1.803 m), SpO2 100%.       No intake or output data in the 24 hours ending 02/06/24 2034 There were no vitals filed for this visit.  Examination: General: alert NAD HENT: PERRLA no icterus, EOMI Lungs: CTA no wheezes no rales Cardiovascular: irreg/irreg, no gallops or rubs Abdomen: soft obese, nt nd bs pos Extremities: no cyanosis, clubbing, RLE cool to touch and weak pedal pulses, LLE warm/reddish/painful with anterior lesion/open wound on shin that seems to have opened up during transport to ED per patient Neuro: AAOx3 CN II to XII grossly intact   Resolved Hospital Problem list   N/a  Assessment & Plan:  Sepsis  -Likely from the cellulitis LLE  -Broad spectrum antibiotics  -check Lactic Acid and will consider CT abdomen, getting LLE xray for any gas suggtive  Of Nec/fasciitis Hypotension  -from sepsis  -IV fluids with monitoring given his cardiac issues LLE cellulitis  -antibiotics for skin infections  -wound care for open wound anterior shin AKI  -from sepsis and hypotension  -had this a couple weeks ago and it had resolved  -monitor I's/O's and serial serum Cr CHF/Cardiomyopathy-EF 40%, S/p BiV ICD/pacemaker  -Negative trops and BNP  -not currently in pulmonary edema or CHF exacerbation/decompensation Atrial fibrillation on Eliquis   -continue with Eliquis  COPD  -Not in exacerbation, nebs, O2 to continue   Best Practice (right click and "Reselect all SmartList Selections" daily)   Diet/type: NPO w/ oral meds DVT prophylaxis DOAC Pressure ulcer(s): N/A GI prophylaxis: N/A Lines: N/A Foley:  N/A  Code Status:  full code   Labs   CBC: Recent Labs  Lab 02/03/24 1615 02/06/24 1320  WBC 13.2* 20.8*  NEUTROABS  --  20.0*  HGB 11.4* 10.2*  HCT 38.5* 36.3*  MCV 99.7 107.7*  PLT 340  233    Basic Metabolic Panel: Recent Labs  Lab 02/03/24 1615 02/06/24 1320  NA 139 136  K 4.9 3.8  CL 103 101  CO2 28 23  GLUCOSE 105* 71  BUN 26* 31*  CREATININE 0.99 1.89*  CALCIUM  9.2 8.4*  MG 2.5*  --    GFR: Estimated Creatinine Clearance: 49.8 mL/min (A) (by C-G formula based on SCr of 1.89 mg/dL (H)). Recent Labs  Lab 02/03/24 1615 02/06/24 1320  WBC 13.2* 20.8*    Liver Function Tests: Recent Labs  Lab 02/03/24 1615 02/06/24 1320  AST 21 25  ALT 31 28  ALKPHOS 58 67  BILITOT 0.6 0.8  PROT 6.1* 5.1*  ALBUMIN  3.4* 2.8*   No results for input(s): "LIPASE", "AMYLASE" in the last 168 hours. No results for input(s): "AMMONIA" in the last 168 hours.  ABG    Component Value Date/Time   HCO3 27.6 02/06/2024 1427   TCO2 29 01/28/2017 1222   TCO2 31 01/28/2017 1222   ACIDBASEDEF 1.0 03/21/2016 1101   O2SAT 69.3 02/06/2024 1427     Coagulation Profile: No results for input(s): "INR", "PROTIME" in the last 168 hours.  Cardiac Enzymes: No results for input(s): "CKTOTAL", "CKMB", "CKMBINDEX", "TROPONINI" in the last 168 hours.  HbA1C: Hgb A1c MFr Bld  Date/Time Value Ref Range Status  01/07/2024 03:46 PM 6.0 4.6 - 6.5 % Final    Comment:    Glycemic Control Guidelines for People with Diabetes:Non Diabetic:  <6%Goal of Therapy: <7%Additional Action Suggested:  >8%   07/01/2023 02:39 PM 5.9 4.6 - 6.5 % Final    Comment:    Glycemic Control Guidelines for People with Diabetes:Non Diabetic:  <6%Goal of Therapy: <7%Additional Action Suggested:  >8%     CBG: No results for input(s): "GLUCAP" in the last 168 hours.  Review of Systems:   Nausea, no Vomiting or diarrhea, positive chest pain and abdominal pain-both mild, no fever, chills sweats, positive weakness  Past Medical History:  He,  has a past medical history of AICD (automatic cardioverter/defibrillator) present, Anemia, ANXIETY, Arthritis, Asthma, Cardiomyopathy (HCC), Chronic constipation, COPD  (chronic obstructive pulmonary disease) (HCC), COVID-19 (12/09/2019), DEPRESSION, Diverticulitis, DYSKINESIA, ESOPHAGUS, Essential hypertension, FIBROMYALGIA, GERD (gastroesophageal reflux disease), Glaucoma, HYPERLIPIDEMIA, INSOMNIA, Myocardial infarction (HCC), O2 dependent, Paroxysmal atrial fibrillation (HCC), Peripheral neuropathy, Pneumonia (12/2016), Rash and other nonspecific skin eruption (04/12/2009), SLEEP APNEA, OBSTRUCTIVE, Syncope, and Type II diabetes mellitus (HCC).   Surgical History:   Past Surgical History:  Procedure Laterality Date   ACNE CYST REMOVAL     2 on back    AV NODE ABLATION N/A 10/25/2017   Procedure: AV NODE ABLATION;  Surgeon: Verona Goodwill, MD;  Location: [facility] INVASIVE CV LAB;  Service: Cardiovascular;  Laterality: N/A;   BIV ICD INSERTION CRT-D N/A 09/18/2017   Procedure: BIV ICD INSERTION CRT-D;  Surgeon: Verona Goodwill, MD;  Location: [facility] INVASIVE CV LAB;  Service: Cardiovascular;  Laterality: N/A;   CARDIAC CATHETERIZATION N/A 03/21/2016   Procedure: Right/Left Heart Cath and Coronary Angiography;  Surgeon: Darlis Eisenmenger, MD;  Location: [facility] INVASIVE CV LAB;  Service: Cardiovascular;  Laterality: N/A;   CARDIOVERSION  04/18/2012   Procedure: CARDIOVERSION;  Surgeon: Elmyra Haggard, MD;  Location[facility]  OR;  Service: Cardiovascular;  Laterality: N/A;   CARDIOVERSION  04/25/2012   Procedure: CARDIOVERSION;  Surgeon: Lake Pilgrim, MD;  Location: [facility] ENDOSCOPY;  Service: Cardiovascular;  Laterality: N/A;   CARDIOVERSION  04/25/2012   Procedure: CARDIOVERSION;  Surgeon: Elmyra Haggard, MD;  Location: [facility] OR;  Service: Cardiovascular;  Laterality: N/A;   CARDIOVERSION  05/09/2012   Procedure: CARDIOVERSION;  Surgeon: Arnoldo Lapping, MD;  Location: [facility] OR;  Service: Cardiovascular;  Laterality: N/A;  changed from crenshaw to cooper by trish/leone-endo   CARDIOVERSION N/A 03/15/2015   Procedure: CARDIOVERSION;  Surgeon: Lake Pilgrim, MD;  Location: [facility] ENDOSCOPY;  Service:  Cardiovascular;  Laterality: N/A;   COLONOSCOPY     COLONOSCOPY WITH PROPOFOL  N/A 10/21/2014   Procedure: COLONOSCOPY WITH PROPOFOL ;  Surgeon: Asencion Blacksmith, MD;  Location: [facility] ENDOSCOPY;  Service: Endoscopy;  Laterality: N/A;   EP IMPLANTABLE DEVICE N/A 04/06/2015   Procedure: Loop Recorder Insertion;  Surgeon: Tammie Fall, MD;  Location: [facility] INVASIVE CV LAB;  Service: Cardiovascular;  Laterality: N/A;   ESOPHAGOGASTRODUODENOSCOPY     JOINT REPLACEMENT     LOOP RECORDER REMOVAL N/A 09/18/2017   Procedure: LOOP RECORDER REMOVAL;  Surgeon: Verona Goodwill, MD;  Location: [facility] INVASIVE CV LAB;  Service: Cardiovascular;  Laterality: N/A;   RIGHT/LEFT HEART CATH AND CORONARY ANGIOGRAPHY N/A 01/28/2017   Procedure: Right/Left Heart Cath and Coronary Angiography;  Surgeon: Darlis Eisenmenger, MD;  Location: [facility] INVASIVE CV LAB;  Service: Cardiovascular;  Laterality: N/A;   TEE WITHOUT CARDIOVERSION  04/25/2012   Procedure: TRANSESOPHAGEAL ECHOCARDIOGRAM (TEE);  Surgeon: Lake Pilgrim, MD;  Location: [facility] ENDOSCOPY;  Service: Cardiovascular;  Laterality: N/A;   TEE WITHOUT CARDIOVERSION N/A 03/15/2015   Procedure: TRANSESOPHAGEAL ECHOCARDIOGRAM (TEE);  Surgeon: Lake Pilgrim, MD;  Location: [facility] ENDOSCOPY;  Service: Cardiovascular;  Laterality: N/A;   TONSILLECTOMY AND ADENOIDECTOMY     TOTAL KNEE ARTHROPLASTY Right 06/15/2014   Procedure: TOTAL KNEE ARTHROPLASTY;  Surgeon: Saundra Curl, MD;  Location: [facility] OR;  Service: Orthopedics;  Laterality: Right;   TOTAL KNEE ARTHROPLASTY Left 10/17/2021   Procedure: Left TOTAL KNEE ARTHROPLASTY;  Surgeon: Arnie Lao, MD;  Location: [facility] OR;  Service: Orthopedics;  Laterality: Left;     Social History:   reports that he quit smoking about 16 years ago. His smoking use included cigarettes. He started smoking about 46 years ago. He has a 60 pack-year smoking history. He has never used smokeless tobacco. He reports that he does not drink alcohol and does not use  drugs.   Family History:  His family history includes Allergies in his mother; Asthma in his maternal grandmother and mother; COPD in his mother; Colon polyps in his mother; Hypothyroidism in his mother. There is no history of Colon cancer.   Allergies Allergies  Allergen Reactions   Tape Other (See Comments)    SKIN TEARS VERY, VERY EASILY!!!! Use Paper Tape Only, PLEASE!!!   Amiodarone  Other (See Comments)    Caused hyperthyroidism    Statins Other (See Comments)    Myalgias    Sulfa  Antibiotics Other (See Comments)    Mouth ulcers   Wound Dressing Adhesive Other (See Comments)    Skin Tears Use Paper Tape Only     Home Medications  Prior to Admission medications   Medication Sig Start Date End Date Taking? Authorizing Provider  albuterol  (PROVENTIL ) (2.5 MG/3ML) 0.083% nebulizer solution Take 3 mLs (2.5 mg total) by nebulization every 6 (six) hours as  needed for wheezing or shortness of breath. 10/17/23   Quillian Brunt, MD  albuterol  (VENTOLIN  HFA) 108 [phone] Base) MCG/ACT inhaler INHALE 1-2 PUFFS BY MOUTH EVERY 6 HOURS AS NEEDED FOR WHEEZE OR SHORTNESS OF BREATH 11/22/23   Quillian Brunt, MD  ALPRAZolam  (XANAX ) 0.5 MG tablet TAKE 2 TABLETS (1 MG TOTAL) BY MOUTH EVERY DAY AT BEDTIME 12/13/23   Roslyn Coombe, MD  ammonium lactate  (AMLACTIN) 12 % cream Apply 1 Application topically as needed for dry skin. 12/25/22   McDonald, Olive Better, DPM  apixaban  (ELIQUIS ) 5 MG TABS tablet Take 1 tablet (5 mg total) by mouth 2 (two) times daily. 06/13/23   Darlis Eisenmenger, MD  arformoterol  (BROVANA ) 15 MCG/2ML NEBU Take 2 mLs (15 mcg total) by nebulization every 12 (twelve) hours. 01/27/24   Raejean Bullock, NP  ascorbic acid (VITAMIN C) 500 MG tablet Take 500 mg by mouth daily.    [provider]  augmented betamethasone  dipropionate (DIPROLENE -AF) 0.05 % cream Apply 1 application  topically daily as needed (to affected areas- for itching). 10/11/20   [provider]  carisoprodol   (SOMA ) 350 MG tablet Take 1 tablet (350 mg total) by mouth 4 (four) times daily. 12/24/23   Roslyn Coombe, MD  Cholecalciferol  (VITAMIN D3) 1000 units CAPS Take 1,000 Units by mouth daily.    [provider]  clotrimazole -betamethasone  (LOTRISONE ) cream Apply 1 Application topically daily. 02/22/23   Henson, Vickie L, NP-C  doxepin  (SINEQUAN ) 10 MG capsule TAKE 2 CAPSULES BY MOUTH AT BEDTIME AS NEEDED 09/26/23   Roslyn Coombe, MD  DULoxetine  (CYMBALTA ) 60 MG capsule TAKE 1 CAPSULE BY MOUTH EVERY DAY 05/20/23   Roslyn Coombe, MD  Dupilumab (DUPIXENT) 300 MG/2ML SOPN Inject 300 mg into the skin every 14 (fourteen) days.    [provider]  Evolocumab  (REPATHA  SURECLICK) 140 MG/ML SOAJ Inject 140 mg into the skin every 14 (fourteen) days. 01/24/23   Darlis Eisenmenger, MD  fluconazole  (DIFLUCAN ) 100 MG tablet Take 1 tablet (100 mg total) by mouth daily. 10/15/23   Roslyn Coombe, MD  fluticasone  (FLONASE ) 50 MCG/ACT nasal spray Place 2 sprays into both nostrils daily as needed for allergies or rhinitis. 07/17/23   Roslyn Coombe, MD  fluticasone -salmeterol (WIXELA INHUB) 250-50 MCG/ACT AEPB Inhale 1 puff into the lungs in the morning and at bedtime. 01/21/24   Quillian Brunt, MD  gabapentin  (NEURONTIN ) 100 MG capsule Take 1 capsule (100 mg total) by mouth 3 (three) times daily. 01/16/24   Semon, Vann Gent, DPM  hydrOXYzine  (ATARAX ) 10 MG tablet TAKE 1 TABLET BY MOUTH 3 TIMES DAILY AS NEEDED FOR ITCHING. Strength: 10 mg 12/28/22   Roslyn Coombe, MD  ipratropium-albuterol  (DUONEB) 0.5-2.5 (3) MG/3ML SOLN TAKE 3 ML BY NEBULIZATION EVERY 4 HOURS AS NEEDED (WHEEZING). 01/29/23   Roslyn Coombe, MD  ketoconazole  (NIZORAL ) 2 % cream Apply 1 Application topically daily. 10/15/23   Roslyn Coombe, MD  losartan  (COZAAR ) 25 MG tablet Take 1 tablet (25 mg total) by mouth daily. 01/17/23   Ruddy Corral M, PA-C  nystatin  (MYCOSTATIN /NYSTOP ) powder Use as directed twice per day as needed to the affected area  05/23/23   Roslyn Coombe, MD  omeprazole  (PRILOSEC) 20 MG capsule TAKE 1 CAPSULE BY MOUTH TWICE A DAY 10/31/23   Roslyn Coombe, MD  potassium chloride  SA (KLOR-CON  M20) 20 MEQ tablet Take 1 tablet (20 mEq total) by mouth daily. TAKE (2  TABLETS) IN THE MORNING Patient taking differently: Take 20 mEq by mouth daily. TAKE 20 MEQ (1 TABLETS) IN THE MORNING 09/04/23   Darlis Eisenmenger, MD  predniSONE  (DELTASONE ) 10 MG tablet TAKE 1 TABLET (10 MG TOTAL) BY MOUTH DAILY WITH BREAKFAST. 01/17/24   Isidro Margo, DO  silver  sulfADIAZINE  (SILVADENE ) 1 % cream APPLY TOPICALLY TO AFFECTED AREA EVERY DAY 02/25/23   Roslyn Coombe, MD  tamsulosin  (FLOMAX ) 0.4 MG CAPS capsule TAKE 1 CAPSULE BY MOUTH EVERY DAY 03/12/23   Roslyn Coombe, MD  Tiotropium Bromide  Monohydrate (SPIRIVA  RESPIMAT) 2.5 MCG/ACT AERS Take 2 puffs by nebulization daily. 01/28/24   Raejean Bullock, NP  tirzepatide [facility]) 5 MG/0.5ML Pen Inject 5 mg into the skin once a week. 01/30/24   Roslyn Coombe, MD  tiZANidine  (ZANAFLEX ) 4 MG tablet Take 4 mg by mouth 3 (three) times daily. 12/28/23   [provider]  torsemide  (DEMADEX ) 20 MG tablet Take 5 tablets (100 mg total) by mouth 2 (two) times daily. 02/03/24   Milford, Arlice Bene, FNP  traMADol  (ULTRAM ) 50 MG tablet Take 1 tablet (50 mg total) by mouth every 6 (six) hours as needed. 02/04/24   Roslyn Coombe, MD  traZODone  (DESYREL ) 100 MG tablet Take 200 mg by mouth at bedtime as needed. 01/08/24   [provider]  TUMS 500 MG chewable tablet Chew 500-2,000 mg by mouth every 4 (four) hours as needed for indigestion or heartburn. 10/19/16   [provider]  valACYclovir  (VALTREX ) 1000 MG tablet Take 1 tablet (1,000 mg total) by mouth 3 (three) times daily. 09/18/23   Roslyn Coombe, MD  venlafaxine  XR (EFFEXOR -XR) 150 MG 24 hr capsule TAKE 1 CAPSULE BY MOUTH DAILY WITH BREAKFAST. 07/09/23   Roslyn Coombe, MD  zolpidem  (AMBIEN ) 10 MG tablet Take 1 tablet (10 mg total) by mouth at  bedtime as needed for sleep. 09/18/23   Roslyn Coombe, MD     Critical care time: 32   The patient is critically ill with multiple organ system failure and requires high complexity decision making for assessment and support, frequent evaluation and titration of therapies, advanced monitoring, review of radiographic studies and interpretation of complex data.   Critical Care Time devoted to patient care services, exclusive of separately billable procedures, described in this note is 35 minutes.   Claven Cumming, MD [facility] See Amion for pager  If no response to pager , please call [phone] until 7pm After 7:00 pm call Elink  [phone] 02/06/2024, 8:35 PM

## 2024-02-06 NOTE — Progress Notes (Addendum)
 eLink Physician-Brief Progress Note Patient Name: [name] DOB: 1955/12/14 MRN: [id number]   Date of Service  02/06/2024  HPI/Events of Note  Patient admitted with septic shock secondary to left lower extremity cellulitis. Patient complaining of 10/10 musculoskeletal pain.  eICU Interventions  New Patient Evaluation. PRN Oxycodone  5 mg ordered.        [name] U Cammy Sanjurjo 02/06/2024, 10:57 PM

## 2024-02-06 NOTE — ED Provider Notes (Signed)
 3:32 PM Care assumed from Dr. Synetta Eves.  At time of transfer care, patient awaiting for results of diagnostic laboratory workup to look for etiologies of his new profound generalized fatigue and weakness that made so he cannot safely ambulate today.  Given his recent discharge within the last 2 weeks and these worsening symptoms today, anticipate he will likely need admission to the hospital.  Anticipate reassessment after workup.  Given his lack of new falls or trauma, we will hold on repeat CT imaging at this time.  7:37 PM While waiting for his labs to return, I was informed that patient's blood pressure has deteriorated.  It appears that it has gone down to the 80s then 60s and then when I went to assess him it was in the 50s.  He is feeling very fatigued and tired.  He received some fluid earlier, will give more fluids.  Given his heart failure, I will start some pressors given how low it is.  His white blood cell count was elevated.  Will add on blood cultures and lactic acid.  As he has not we will urinate, may be urinary source.  Will give broad-spectrum antibiotics per pharmacy and will call critical care given the initiation of pressors.  Anticipate readmission by critical care.   [name], Zachary Sia, MD 02/06/24 [phone]

## 2024-02-06 NOTE — ED Provider Notes (Signed)
 [place] EMERGENCY DEPARTMENT AT [facility] Provider Note   CSN: [id number] Arrival date & time: 02/06/24  1245     History  Chief Complaint  Patient presents with   Weakness    [name] is a 68 y.o. male.  HPI 68 year old male history of COPD, nonischemic cardiomyopathy, status post AICD, A-fib, on chronic anticoagulation, history of cervical spine fracture who was recently discharged (4/14) after admission for sepsis.  During that admission he was on IV  pressors and stress dose steroids.  He is reported to be chronically immunosuppressed and during last hospitalization qualified for home oxygen .  He was discharged to home 10 days ago.  He states he has been weak but has been able to help himself get up with walker.  Today he was so weak that he was unable to get out of bed at all.  He reports left chest pain that radiates back to his back with pain that has been present since fall last summer, endorses some ongoing dyspnea, denies any new nausea vomiting or diarrhea.  Reports a leg wound on his left lower leg that has been somewhat worse than baseline.     Home Medications Prior to Admission medications   Medication Sig Start Date End Date Taking? Authorizing Provider  albuterol  (VENTOLIN  HFA) 108 (90 Base) MCG/ACT inhaler INHALE 1-2 PUFFS BY MOUTH EVERY 6 HOURS AS NEEDED FOR WHEEZE OR SHORTNESS OF BREATH 11/22/23  Yes Quillian Brunt, MD  ammonium lactate  (AMLACTIN) 12 % cream Apply 1 Application topically as needed for dry skin. 12/25/22  Yes McDonald, Olive Better, DPM  apixaban  (ELIQUIS ) 5 MG TABS tablet Take 1 tablet (5 mg total) by mouth 2 (two) times daily. 06/13/23  Yes Darlis Eisenmenger, MD  ascorbic acid (VITAMIN C) 500 MG tablet Take 500 mg by mouth daily.   Yes [provider]  augmented betamethasone  dipropionate (DIPROLENE -AF) 0.05 % cream Apply 1 application  topically daily as needed (to affected areas- for itching). 10/11/20  Yes [provider]  Cholecalciferol  (VITAMIN D3) 50 MCG (2000 UT) TABS Take 2,000 Units by mouth daily.   Yes [provider]  Cyanocobalamin  (VITAMIN B-12) 2500 MCG SUBL Place 2,500 mcg under the tongue daily.   Yes [provider]  DULoxetine  (CYMBALTA ) 60 MG capsule TAKE 1 CAPSULE BY MOUTH EVERY DAY 05/20/23  Yes Roslyn Coombe, MD  Dupilumab (DUPIXENT) 300 MG/2ML SOPN Inject 300 mg into the skin every 14 (fourteen) days.   Yes [provider]  fluticasone  (FLONASE ) 50 MCG/ACT nasal spray Place 2 sprays into both nostrils daily as needed for allergies or rhinitis. 07/17/23  Yes Roslyn Coombe, MD  fluticasone -salmeterol (WIXELA INHUB) 250-50 MCG/ACT AEPB Inhale 1 puff into the lungs in the morning and at bedtime. 01/21/24  Yes Quillian Brunt, MD  gabapentin  (NEURONTIN ) 100 MG capsule Take 1 capsule (100 mg total) by mouth 3 (three) times daily. 01/16/24  Yes Semon, Jake L, DPM  ipratropium-albuterol  (DUONEB) 0.5-2.5 (3) MG/3ML SOLN TAKE 3 ML BY NEBULIZATION EVERY 4 HOURS AS NEEDED (WHEEZING). 01/29/23  Yes Roslyn Coombe, MD  Multiple Vitamins-Minerals (SENIOR VITES PO) Take 1 tablet by mouth daily with breakfast.   Yes [provider]  omeprazole  (PRILOSEC) 20 MG capsule TAKE 1 CAPSULE BY MOUTH TWICE A DAY 10/31/23  Yes Roslyn Coombe, MD  predniSONE  (DELTASONE ) 10 MG tablet TAKE 1 TABLET (10 MG TOTAL) BY MOUTH DAILY WITH BREAKFAST. 01/17/24  Yes Smith, Zachary  M, DO  tamsulosin  (FLOMAX ) 0.4 MG CAPS capsule TAKE 1 CAPSULE BY MOUTH EVERY DAY 03/12/23  Yes Roslyn Coombe, MD  Tiotropium Bromide  Monohydrate (SPIRIVA  RESPIMAT) 2.5 MCG/ACT AERS Take 2 puffs by nebulization daily. Patient taking differently: Inhale 2 puffs into the lungs daily. 01/28/24  Yes Raejean Bullock, NP  tirzepatide [facility]) 5 MG/0.5ML Pen Inject 5 mg into the skin once a week. Patient taking differently: Inject 5 mg into the skin every Wednesday. 01/30/24  Yes Roslyn Coombe, MD  TUMS 500 MG chewable tablet  Chew 1 tablet by mouth 2 (two) times daily as needed for indigestion or heartburn. 10/19/16  Yes [provider]  TYLENOL  500 MG tablet Take 2,000 mg by mouth in the morning and at bedtime.   Yes [provider]  venlafaxine  XR (EFFEXOR -XR) 150 MG 24 hr capsule TAKE 1 CAPSULE BY MOUTH DAILY WITH BREAKFAST. 07/09/23  Yes Roslyn Coombe, MD  ALPRAZolam  (XANAX ) 0.5 MG tablet Take 1 tablet (0.5 mg total) by mouth at bedtime. 02/13/24   Gonfa, Taye T, MD  carisoprodol  (SOMA ) 350 MG tablet Take 1 tablet (350 mg total) by mouth 4 (four) times daily as needed for muscle spasms. 02/13/24   Gonfa, Taye T, MD  cefadroxil  (DURICEF) 500 MG capsule Take 2 capsules (1,000 mg total) by mouth 2 (two) times daily for 5 days. 02/13/24 02/18/24  Gonfa, Taye T, MD  Evolocumab  (REPATHA  SURECLICK) 140 MG/ML SOAJ INJECT 140 MG INTO THE SKIN EVERY 14 DAYS 02/10/24   McLean, Dalton S, MD  leptospermum manuka honey (MEDIHONEY) PSTE paste Apply 1 Application topically daily. 02/12/24   Gonfa, Taye T, MD  metoprolol  tartrate (LOPRESSOR ) 25 MG tablet Take 0.5 tablets (12.5 mg total) by mouth 2 (two) times daily. 02/13/24   Gonfa, Taye T, MD  torsemide  (DEMADEX ) 20 MG tablet Take 2 tablets (40 mg total) by mouth daily. 02/13/24   Gonfa, Taye T, MD  traMADol  (ULTRAM ) 50 MG tablet Take 1 tablet (50 mg total) by mouth every 8 (eight) hours as needed (for pain). 02/13/24 03/14/24  Gonfa, Taye T, MD      Allergies    Tape, Amiodarone , Statins, Sulfa  antibiotics, and Wound dressing adhesive    Review of Systems   Review of Systems  Physical Exam Updated Vital Signs BP 93/62 (BP Location: Right Arm)   Pulse 69   Temp 98.4 F (36.9 C) (Oral)   Resp 19   Ht 1.778 m (5\' 10" )   Wt 122.5 kg   SpO2 95%   BMI 38.75 kg/m  Physical Exam Vitals reviewed.  Constitutional:      General: He is not in acute distress.    Appearance: He is obese.     Comments: Chronically ill-appearing man  HENT:     Head: Normocephalic and  atraumatic.     Right Ear: External ear normal.     Left Ear: External ear normal.     Nose: Nose normal.     Mouth/Throat:     Mouth: Mucous membranes are dry.  Eyes:     Pupils: Pupils are equal, round, and reactive to light.  Cardiovascular:     Rate and Rhythm: Normal rate.     Pulses: Normal pulses.  Pulmonary:     Effort: Pulmonary effort is normal.  Abdominal:     Palpations: Abdomen is soft.  Musculoskeletal:     Cervical back: Normal range of motion.  Skin:    General: Skin is warm and  dry.     Capillary Refill: Capillary refill takes less than 2 seconds.     Comments: Wound on lle improving per patient  Neurological:     General: No focal deficit present.     Mental Status: He is alert.  Psychiatric:        Mood and Affect: Mood normal.     ED Results / Procedures / Treatments   Labs (all labs ordered are listed, but only abnormal results are displayed) Labs Reviewed  CBC WITH DIFFERENTIAL/PLATELET - Abnormal; Notable for the following components:      Result Value   WBC 20.8 (*)    RBC 3.37 (*)    Hemoglobin 10.2 (*)    HCT 36.3 (*)    MCV 107.7 (*)    MCHC 28.1 (*)    Neutro Abs 20.0 (*)    Lymphs Abs 0.6 (*)    All other components within normal limits  COMPREHENSIVE METABOLIC PANEL WITH GFR - Abnormal; Notable for the following components:   BUN 31 (*)    Creatinine, Ser 1.89 (*)    Calcium  8.4 (*)    Total Protein 5.1 (*)    Albumin  2.8 (*)    GFR, Estimated 38 (*)    All other components within normal limits  URINALYSIS, ROUTINE W REFLEX MICROSCOPIC - Abnormal; Notable for the following components:   Color, Urine AMBER (*)    APPearance HAZY (*)    Hgb urine dipstick SMALL (*)    Ketones, ur 5 (*)    Protein, ur 30 (*)    Leukocytes,Ua TRACE (*)    Bacteria, UA RARE (*)    All other components within normal limits  CBC - Abnormal; Notable for the following components:   WBC 28.6 (*)    RBC 3.62 (*)    Hemoglobin 11.0 (*)    HCT 36.5 (*)     MCV 100.8 (*)    All other components within normal limits  BASIC METABOLIC PANEL WITH GFR - Abnormal; Notable for the following components:   Chloride 97 (*)    Glucose, Bld 66 (*)    BUN 41 (*)    Creatinine, Ser 2.77 (*)    Calcium  8.3 (*)    GFR, Estimated 24 (*)    All other components within normal limits  MAGNESIUM  - Abnormal; Notable for the following components:   Magnesium  1.5 (*)    All other components within normal limits  PHOSPHORUS - Abnormal; Notable for the following components:   Phosphorus 6.2 (*)    All other components within normal limits  CBC - Abnormal; Notable for the following components:   WBC 22.7 (*)    RBC 3.18 (*)    Hemoglobin 9.6 (*)    HCT 30.3 (*)    All other components within normal limits  BASIC METABOLIC PANEL WITH GFR - Abnormal; Notable for the following components:   Sodium 129 (*)    Chloride 93 (*)    Glucose, Bld 119 (*)    BUN 60 (*)    Creatinine, Ser 3.19 (*)    Calcium  7.7 (*)    GFR, Estimated 20 (*)    All other components within normal limits  GLUCOSE, CAPILLARY - Abnormal; Notable for the following components:   Glucose-Capillary 126 (*)    All other components within normal limits  GLUCOSE, CAPILLARY - Abnormal; Notable for the following components:   Glucose-Capillary 140 (*)    All other components within normal limits  GLUCOSE, CAPILLARY - Abnormal; Notable for the following components:   Glucose-Capillary 133 (*)    All other components within normal limits  GLUCOSE, CAPILLARY - Abnormal; Notable for the following components:   Glucose-Capillary 102 (*)    All other components within normal limits  GLUCOSE, CAPILLARY - Abnormal; Notable for the following components:   Glucose-Capillary 103 (*)    All other components within normal limits  BASIC METABOLIC PANEL WITH GFR - Abnormal; Notable for the following components:   Sodium 129 (*)    Chloride 92 (*)    Glucose, Bld 114 (*)    BUN 66 (*)    Creatinine,  Ser 2.93 (*)    Calcium  8.1 (*)    GFR, Estimated 22 (*)    All other components within normal limits  GLUCOSE, CAPILLARY - Abnormal; Notable for the following components:   Glucose-Capillary 103 (*)    All other components within normal limits  CBC - Abnormal; Notable for the following components:   WBC 15.3 (*)    RBC 3.04 (*)    Hemoglobin 9.1 (*)    HCT 28.7 (*)    All other components within normal limits  BASIC METABOLIC PANEL WITH GFR - Abnormal; Notable for the following components:   Sodium 131 (*)    Chloride 94 (*)    BUN 64 (*)    Creatinine, Ser 2.27 (*)    Calcium  8.4 (*)    GFR, Estimated 30 (*)    All other components within normal limits  GLUCOSE, CAPILLARY - Abnormal; Notable for the following components:   Glucose-Capillary 103 (*)    All other components within normal limits  GLUCOSE, CAPILLARY - Abnormal; Notable for the following components:   Glucose-Capillary 141 (*)    All other components within normal limits  GLUCOSE, CAPILLARY - Abnormal; Notable for the following components:   Glucose-Capillary 135 (*)    All other components within normal limits  CBC - Abnormal; Notable for the following components:   WBC 11.7 (*)    RBC 3.12 (*)    Hemoglobin 9.3 (*)    HCT 29.7 (*)    nRBC 0.3 (*)    All other components within normal limits  BASIC METABOLIC PANEL WITH GFR - Abnormal; Notable for the following components:   BUN 56 (*)    Creatinine, Ser 1.60 (*)    GFR, Estimated 46 (*)    All other components within normal limits  GLUCOSE, CAPILLARY - Abnormal; Notable for the following components:   Glucose-Capillary 111 (*)    All other components within normal limits  GLUCOSE, CAPILLARY - Abnormal; Notable for the following components:   Glucose-Capillary 111 (*)    All other components within normal limits  GLUCOSE, CAPILLARY - Abnormal; Notable for the following components:   Glucose-Capillary 122 (*)    All other components within normal limits   GLUCOSE, CAPILLARY - Abnormal; Notable for the following components:   Glucose-Capillary 100 (*)    All other components within normal limits  RENAL FUNCTION PANEL - Abnormal; Notable for the following components:   Glucose, Bld 109 (*)    BUN 39 (*)    Calcium  8.8 (*)    Albumin  2.1 (*)    All other components within normal limits  CBC - Abnormal; Notable for the following components:   RBC 3.25 (*)    Hemoglobin 9.6 (*)    HCT 31.1 (*)    All other components within normal limits  GLUCOSE,  CAPILLARY - Abnormal; Notable for the following components:   Glucose-Capillary 108 (*)    All other components within normal limits  GLUCOSE, CAPILLARY - Abnormal; Notable for the following components:   Glucose-Capillary 116 (*)    All other components within normal limits  GLUCOSE, CAPILLARY - Abnormal; Notable for the following components:   Glucose-Capillary 114 (*)    All other components within normal limits  GLUCOSE, CAPILLARY - Abnormal; Notable for the following components:   Glucose-Capillary 109 (*)    All other components within normal limits  GLUCOSE, CAPILLARY - Abnormal; Notable for the following components:   Glucose-Capillary 125 (*)    All other components within normal limits  RENAL FUNCTION PANEL - Abnormal; Notable for the following components:   BUN 31 (*)    Calcium  8.7 (*)    Phosphorus 2.2 (*)    Albumin  2.3 (*)    All other components within normal limits  CBC - Abnormal; Notable for the following components:   WBC 12.1 (*)    RBC 3.38 (*)    Hemoglobin 10.0 (*)    HCT 32.6 (*)    All other components within normal limits  GLUCOSE, CAPILLARY - Abnormal; Notable for the following components:   Glucose-Capillary 102 (*)    All other components within normal limits  GLUCOSE, CAPILLARY - Abnormal; Notable for the following components:   Glucose-Capillary 66 (*)    All other components within normal limits  GLUCOSE, CAPILLARY - Abnormal; Notable for the  following components:   Glucose-Capillary 103 (*)    All other components within normal limits  RENAL FUNCTION PANEL - Abnormal; Notable for the following components:   Calcium  8.6 (*)    Albumin  2.2 (*)    All other components within normal limits  MAGNESIUM  - Abnormal; Notable for the following components:   Magnesium  1.6 (*)    All other components within normal limits  CBC - Abnormal; Notable for the following components:   WBC 11.5 (*)    RBC 3.38 (*)    Hemoglobin 9.9 (*)    HCT 32.3 (*)    All other components within normal limits  GLUCOSE, CAPILLARY - Abnormal; Notable for the following components:   Glucose-Capillary 121 (*)    All other components within normal limits  GLUCOSE, CAPILLARY - Abnormal; Notable for the following components:   Glucose-Capillary 136 (*)    All other components within normal limits  I-STAT CG4 LACTIC ACID, ED - Abnormal; Notable for the following components:   Lactic Acid, Venous 2.5 (*)    All other components within normal limits  POCT I-STAT 7, (LYTES, BLD GAS, ICA,H+H) - Abnormal; Notable for the following components:   pH, Arterial 7.280 (*)    pCO2 arterial 51.6 (*)    pO2, Arterial 68 (*)    Acid-base deficit 3.0 (*)    Sodium 127 (*)    Calcium , Ion 1.11 (*)    HCT 30.0 (*)    Hemoglobin 10.2 (*)    All other components within normal limits  POCT I-STAT 7, (LYTES, BLD GAS, ICA,H+H) - Abnormal; Notable for the following components:   pH, Arterial 7.342 (*)    pO2, Arterial 66 (*)    Sodium 130 (*)    HCT 30.0 (*)    Hemoglobin 10.2 (*)    All other components within normal limits  TROPONIN I (HIGH SENSITIVITY) - Abnormal; Notable for the following components:   Troponin I (High Sensitivity) 21 (*)  All other components within normal limits  TROPONIN I (HIGH SENSITIVITY) - Abnormal; Notable for the following components:   Troponin I (High Sensitivity) 23 (*)    All other components within normal limits  CULTURE, BLOOD  (ROUTINE X 2)  CULTURE, BLOOD (ROUTINE X 2)  MRSA NEXT GEN BY PCR, NASAL  BLOOD GAS, VENOUS  GLUCOSE, CAPILLARY  LACTIC ACID, PLASMA  GLUCOSE, CAPILLARY  GLUCOSE, CAPILLARY  SODIUM, URINE, RANDOM  CREATININE, URINE, RANDOM  GLUCOSE, CAPILLARY  GLUCOSE, CAPILLARY  MAGNESIUM   VANCOMYCIN , RANDOM  GLUCOSE, CAPILLARY  GLUCOSE, CAPILLARY  GLUCOSE, CAPILLARY  VANCOMYCIN , RANDOM  GLUCOSE, CAPILLARY  MAGNESIUM   MAGNESIUM   GLUCOSE, CAPILLARY  GLUCOSE, CAPILLARY  GLUCOSE, CAPILLARY  GLUCOSE, CAPILLARY  GLUCOSE, CAPILLARY  CBG MONITORING, ED    EKG EKG Interpretation Date/Time:  Thursday February 06 2024 12:55:22 EDT Ventricular Rate:  71 PR Interval:  200 QRS Duration:  177 QT Interval:  452 QTC Calculation: 492 R Axis:   49  Text Interpretation: VENTRICULAR PACED RHYTHM Right bundle branch block Probable anteroseptal infarct, old Confirmed by Auston Blush [phone]) on 02/06/2024 3:05:55 PM  Radiology No results found.   Procedures Procedures    Medications Ordered in ED Medications  0.9 %  sodium chloride  infusion (has no administration in time range)  oxyCODONE  (Oxy IR/ROXICODONE ) immediate release tablet 5 mg (5 mg Oral Given 02/09/24 2130)  acetaminophen  (TYLENOL ) tablet 650 mg (650 mg Oral Given 02/08/24 0013)  lactated ringers  bolus 500 mL (0 mLs Intravenous Stopped 02/06/24 2111)  oxyCODONE -acetaminophen  (PERCOCET/ROXICET) 5-325 MG per tablet 1 tablet (1 tablet Oral Given 02/06/24 1622)  lactated ringers  bolus 1,000 mL (0 mLs Intravenous Stopped 02/06/24 2111)  ceFEPIme  (MAXIPIME ) 2 g in sodium chloride  0.9 % 100 mL IVPB (0 g Intravenous Stopped 02/06/24 2040)  vancomycin  (VANCOREADY) IVPB 2000 mg/400 mL (0 mg Intravenous Stopped 02/06/24 2257)  magnesium  sulfate IVPB 2 g 50 mL (0 g Intravenous Paused 02/07/24 0710)  hydrocortisone  sodium succinate  (SOLU-CORTEF ) 100 MG injection 100 mg (100 mg Intravenous Given 02/07/24 1017)  gadobutrol  (GADAVIST ) 1 MMOL/ML injection 10 mL  (10 mLs Intravenous Contrast Given 02/07/24 1333)  alum & mag hydroxide-simeth (MAALOX/MYLANTA) 200-200-20 MG/5ML suspension 30 mL (30 mLs Oral Given 02/07/24 1442)  lactated ringers  bolus 500 mL ( Intravenous Paused 02/07/24 1727)  albumin  human 5 % solution 25 g (25 g Intravenous New Bag/Given 02/08/24 1009)  vancomycin  (VANCOCIN ) IVPB 1000 mg/200 mL premix (1,000 mg Intravenous New Bag/Given 02/09/24 0911)  ceFAZolin  (ANCEF ) IVPB 2g/100 mL premix (2 g Intravenous New Bag/Given 02/12/24 2135)  magnesium  sulfate IVPB 2 g 50 mL (2 g Intravenous New Bag/Given 02/13/24 9562)    ED Course/ Medical Decision Making/ A&P Clinical Course as of 02/14/24 1058  Thu Feb 06, 2024  1503 X-Jehan Bonano reviewed interpreted no evidence of acute abnormality noted [DR]    Clinical Course User Index [DR] Auston Blush, MD                                 Medical Decision Making Amount and/or Complexity of Data Reviewed Labs: ordered. Radiology: ordered.  Risk Prescription drug management. Decision regarding hospitalization.   68 year old man with morbid obesity, nonischemic cardiomyopathy, COPD, diabetes with lower extremity diabetic ulcer with recent admission for sepsis, discharged 3 days ago presents today with increasing weakness inability to stand and walk.  He states that he was weak when he left but has not increasing weakness since he went  home.  He denies any change in chest pain or dyspnea, although chronically dyspneic. He denies fever or chills.  He has a wound on his left lower leg that he states had the dressing change yesterday and was at baseline.  He denies any headache or head injury. 1- increased weakness and inability to get up in chronically ill, obese and deconditioned patient with recent hospitalization for sepsis- cbc, cmet urinalysis 2- chest pain that is chronic by history- EKG with ventricular paced rhythm, troponin and cxr ordered 3- chronic wound lle- per patient improving.  No streaking,  fever noted 4- chronic afib with ventricular pacemaker on EKG   Care discussed and signed out to Dr. Manus Sellers       Final Clinical Impression(s) / ED Diagnoses Final diagnoses:  Hypotension, unspecified hypotension type  Fatigue, unspecified type  Generalized weakness  Wound of left lower extremity, subsequent encounter    Rx / DC Orders ED Discharge Orders          Ordered    torsemide  (DEMADEX ) 20 MG tablet  Daily       Note to Pharmacy: Please cancel all previous orders for current medication. Change in dosage or pill size.   02/13/24 0648    ALPRAZolam  (XANAX ) 0.5 MG tablet  Daily at bedtime       Note to Pharmacy: Not to exceed 1 additional fills before 12/21/2023   02/13/24 0648    carisoprodol  (SOMA ) 350 MG tablet  4 times daily PRN        02/13/24 0648    traMADol  (ULTRAM ) 50 MG tablet  Every 8 hours PRN        02/13/24 0648    cefadroxil  (DURICEF) 500 MG capsule  2 times daily        02/13/24 0648    metoprolol  tartrate (LOPRESSOR ) 25 MG tablet  2 times daily        02/13/24 0648    torsemide  (DEMADEX ) 20 MG tablet  Daily,   Status:  Discontinued       Note to Pharmacy: Please cancel all previous orders for current medication. Change in dosage or pill size.   02/12/24 0755    metoprolol  tartrate (LOPRESSOR ) 25 MG tablet  2 times daily,   Status:  Discontinued        02/12/24 0755    cefadroxil  (DURICEF) 500 MG capsule  2 times daily,   Status:  Discontinued        02/12/24 0755    leptospermum manuka honey (MEDIHONEY) PSTE paste  Daily        02/12/24 0755    Increase activity slowly        02/12/24 0755    Diet - low sodium heart healthy        02/12/24 0755    Discharge instructions  Status:  Canceled       Comments: It has been a pleasure taking care of you!  You were hospitalized due to leg infection and kidney injury for which you have been treated.  We are discharging you on more antibiotics to complete treatment course for infection.  Your kidney  function has recovered.  We made adjustment to your home medication during this hospitalization.  Please review your new medication list and the directions on your medications before you take them.  Follow-up with your primary care doctor in 1 to 2 weeks or sooner if needed.   Take care,   02/12/24 0755    Discharge wound care:  Comments: Wound care  Daily      Comments: Cleanse L leg wounds with Vashe wound cleanser Timm Foot [phone]) do not rinse and allow to air dry.  Apply Xeroform gauze ([place] [phone]) to serum filled blister (distal to knee).  Apply Medihoney to necrotic wound bed lower leg daily, cover with dry gauze and ABD pad.  Secure with Kerlix roll gauze anchored around foot and ending right below knee.   02/12/24 0755    Discharge instructions       Comments: It has been a pleasure taking care of you!  You were hospitalized due to leg infection and kidney injury for which you have been treated.  We are discharging you on more antibiotics to complete treatment course for infection.  Your kidney function has recovered.  We made adjustment to your home medication during this hospitalization.  Please review your new medication list and the directions on your medications before you take them.  Follow-up with your primary care doctor in 1 to 2 weeks or sooner if needed.  Note that combination of Xanax , gabapentin , Soma , trazodone , Tylenol  PM, Zanaflex , tramadol  and Ambien  can increase your risk of drowsiness, sedation,  impaired judgment, respiratory depression that could potentially lead to death and impaired balance that could increase risk of fall.  We have stopped some of these medications.  We strongly recommend talking to your doctors about the rest of this medications. We do not recommend driving, operating machinery or other activity that requires similar mental and physical engagement.     Take care,   02/12/24 0757              Auston Blush, MD 02/14/24 1058

## 2024-02-06 NOTE — Telephone Encounter (Signed)
 Chief Complaint: severe back pain Symptoms: severe lower back pain, difficulty walking Frequency: several months/chronic, worsening x 2 weeks Pertinent Negatives: Patient denies weakness/numbness/tingling in legs, loss of bowel or bladder control, fever, abdominal pain, urinary frequency, blood in urine, burning with urination. Disposition: [x] ED /[] Urgent Care (no appt availability in office) / [] Appointment(In office/virtual)/ []  [facility]/ [] Home Care/ [] Refused Recommended Disposition /[] [facility]/ []  Follow-up with PCP Additional Notes: Caretaker, Amalia Badder, on the phone states patient had a fall 2 weeks ago and was admitted to the hospital. She states he was discharged on 4/14 with swelling and worsened back pain. She states he is on a diuretic which has helped the swelling come down about 60%. She states the back pain is so severe she has to help the patient walk and with ADL's including toileting. During triage she is assisting patient with toileting and he is not strong enough to stand or sit on his own. Advised she call 911 to get rescue to safely bring him to the ED. Patient refusing. Called [facility] and notified staff of patient cancelling his HFU appt today with Dr Autry Legions due to too weak/too much pain to come in and patient's refusal to go to ED.  Copied from CRM [id number]. Topic: Clinical - Red Word Triage >> Feb 06, 2024  9:50 AM Star [place] wrote: Red Word that prompted transfer to Nurse Triage: Amalia Badder Apple calling for patient- patient in so much pain in back that he can barely move Reason for Disposition  [1] SEVERE back pain (e.g., excruciating) AND [2] sudden onset AND [3] age > 60 years  Answer Assessment - Initial Assessment Questions 1. ONSET: "When did the pain begin?"      Several months, worsened x 2 weeks.  2. LOCATION: "Where does it hurt?" (upper, mid or lower back)     Lower back pain.  3. SEVERITY: "How bad is the pain?"  (e.g., Scale 1-10; mild,  moderate, or severe)   - MILD (1-3): Doesn't interfere with normal activities.    - MODERATE (4-7): Interferes with normal activities or awakens from sleep.    - SEVERE (8-10): Excruciating pain, unable to do any normal activities.      Severe, patient unable to do normal activities, family member has been helping him walk.  4. PATTERN: "Is the pain constant?" (e.g., yes, no; constant, intermittent)      Constant, improves when sitting down and relaxed. Worse with movement.  5. RADIATION: "Does the pain shoot into your legs or somewhere else?"     Yes radiates to buttocks and down his legs.  6. CAUSE:  "What do you think is causing the back pain?"      Chronic back pain.  7. BACK OVERUSE:  "Any recent lifting of heavy objects, strenuous work or exercise?"     Patient was hospitalized for a fall 2 weeks ago and hospitalized for a few days.  8. MEDICINES: "What have you taken so far for the pain?" (e.g., nothing, acetaminophen , NSAIDS)     Tramadol  (not helping), Tylenol  and Ibuprofen , Biofreeze, lidocaine  patches.  9. NEUROLOGIC SYMPTOMS: "Do you have any weakness, numbness, or problems with bowel/bladder control?"     Denies.  10. OTHER SYMPTOMS: "Do you have any other symptoms?" (e.g., fever, abdomen pain, burning with urination, blood in urine)       Left leg wound (from his fall 2 weeks ago), unable to stand or walk on his own.  11. PREGNANCY: "Is there any  chance you are pregnant?" "When was your last menstrual period?"       N/A.  Protocols used: Back Pain-A-AH

## 2024-02-06 NOTE — ED Triage Notes (Signed)
 Gen weakness. Per ems 2L via [facility] at [facility] CBG 108 HR60 paced

## 2024-02-06 NOTE — ED Notes (Signed)
 Not able to obtain UA provider notified

## 2024-02-07 ENCOUNTER — Other Ambulatory Visit (HOSPITAL_COMMUNITY): Payer: Self-pay | Admitting: Cardiology

## 2024-02-07 ENCOUNTER — Inpatient Hospital Stay (HOSPITAL_COMMUNITY)

## 2024-02-07 ENCOUNTER — Telehealth (HOSPITAL_COMMUNITY): Payer: Self-pay

## 2024-02-07 DIAGNOSIS — N179 Acute kidney failure, unspecified: Secondary | ICD-10-CM

## 2024-02-07 DIAGNOSIS — R6521 Severe sepsis with septic shock: Secondary | ICD-10-CM | POA: Diagnosis not present

## 2024-02-07 DIAGNOSIS — I2510 Atherosclerotic heart disease of native coronary artery without angina pectoris: Secondary | ICD-10-CM

## 2024-02-07 DIAGNOSIS — I959 Hypotension, unspecified: Secondary | ICD-10-CM | POA: Diagnosis not present

## 2024-02-07 DIAGNOSIS — A419 Sepsis, unspecified organism: Secondary | ICD-10-CM | POA: Diagnosis not present

## 2024-02-07 DIAGNOSIS — I509 Heart failure, unspecified: Secondary | ICD-10-CM

## 2024-02-07 DIAGNOSIS — J449 Chronic obstructive pulmonary disease, unspecified: Secondary | ICD-10-CM

## 2024-02-07 DIAGNOSIS — E7849 Other hyperlipidemia: Secondary | ICD-10-CM

## 2024-02-07 LAB — CBC
HCT: 36.5 % — ABNORMAL LOW (ref 39.0–52.0)
Hemoglobin: 11 g/dL — ABNORMAL LOW (ref 13.0–17.0)
MCH: 30.4 pg (ref 26.0–34.0)
MCHC: 30.1 g/dL (ref 30.0–36.0)
MCV: 100.8 fL — ABNORMAL HIGH (ref 80.0–100.0)
Platelets: 291 10*3/uL (ref 150–400)
RBC: 3.62 MIL/uL — ABNORMAL LOW (ref 4.22–5.81)
RDW: 15.4 % (ref 11.5–15.5)
WBC: 28.6 10*3/uL — ABNORMAL HIGH (ref 4.0–10.5)
nRBC: 0 % (ref 0.0–0.2)

## 2024-02-07 LAB — GLUCOSE, CAPILLARY
Glucose-Capillary: 126 mg/dL — ABNORMAL HIGH (ref 70–99)
Glucose-Capillary: 140 mg/dL — ABNORMAL HIGH (ref 70–99)
Glucose-Capillary: 71 mg/dL (ref 70–99)
Glucose-Capillary: 82 mg/dL (ref 70–99)
Glucose-Capillary: 83 mg/dL (ref 70–99)
Glucose-Capillary: 95 mg/dL (ref 70–99)

## 2024-02-07 LAB — BASIC METABOLIC PANEL WITH GFR
Anion gap: 14 (ref 5–15)
BUN: 41 mg/dL — ABNORMAL HIGH (ref 8–23)
CO2: 24 mmol/L (ref 22–32)
Calcium: 8.3 mg/dL — ABNORMAL LOW (ref 8.9–10.3)
Chloride: 97 mmol/L — ABNORMAL LOW (ref 98–111)
Creatinine, Ser: 2.77 mg/dL — ABNORMAL HIGH (ref 0.61–1.24)
GFR, Estimated: 24 mL/min — ABNORMAL LOW (ref >60)
Glucose, Bld: 66 mg/dL — ABNORMAL LOW (ref 70–99)
Potassium: 4.8 mmol/L (ref 3.5–5.1)
Sodium: 135 mmol/L (ref 135–145)

## 2024-02-07 LAB — MRSA NEXT GEN BY PCR, NASAL: MRSA by PCR Next Gen: NOT DETECTED

## 2024-02-07 LAB — LACTIC ACID, PLASMA: Lactic Acid, Venous: 1.5 mmol/L (ref 0.5–1.9)

## 2024-02-07 LAB — MAGNESIUM: Magnesium: 1.5 mg/dL — ABNORMAL LOW (ref 1.7–2.4)

## 2024-02-07 LAB — PHOSPHORUS: Phosphorus: 6.2 mg/dL — ABNORMAL HIGH (ref 2.5–4.6)

## 2024-02-07 MED ORDER — APIXABAN 5 MG PO TABS
5.0000 mg | ORAL_TABLET | Freq: Two times a day (BID) | ORAL | Status: DC
  Administered 2024-02-07 – 2024-02-13 (×13): 5 mg via ORAL
  Filled 2024-02-07 (×13): qty 1

## 2024-02-07 MED ORDER — NOREPINEPHRINE 4 MG/250ML-% IV SOLN
0.0000 ug/min | INTRAVENOUS | Status: DC
  Administered 2024-02-07: 9 ug/min via INTRAVENOUS
  Filled 2024-02-07: qty 250

## 2024-02-07 MED ORDER — REVEFENACIN 175 MCG/3ML IN SOLN
175.0000 ug | Freq: Every day | RESPIRATORY_TRACT | Status: DC
  Administered 2024-02-07 – 2024-02-13 (×6): 175 ug via RESPIRATORY_TRACT
  Filled 2024-02-07 (×8): qty 3

## 2024-02-07 MED ORDER — PREDNISONE 10 MG PO TABS
10.0000 mg | ORAL_TABLET | Freq: Every day | ORAL | Status: DC
Start: 2024-02-08 — End: 2024-02-07

## 2024-02-07 MED ORDER — ARFORMOTEROL TARTRATE 15 MCG/2ML IN NEBU
15.0000 ug | INHALATION_SOLUTION | Freq: Two times a day (BID) | RESPIRATORY_TRACT | Status: DC
Start: 2024-02-07 — End: 2024-02-13
  Administered 2024-02-07 – 2024-02-13 (×12): 15 ug via RESPIRATORY_TRACT
  Filled 2024-02-07 (×13): qty 2

## 2024-02-07 MED ORDER — VASOPRESSIN 20 UNITS/100 ML INFUSION FOR SHOCK
0.0000 [IU]/min | INTRAVENOUS | Status: DC
  Administered 2024-02-07: 0.03 [IU]/min via INTRAVENOUS
  Filled 2024-02-07: qty 100

## 2024-02-07 MED ORDER — TIZANIDINE HCL 2 MG PO TABS
2.0000 mg | ORAL_TABLET | Freq: Three times a day (TID) | ORAL | Status: DC
  Filled 2024-02-07: qty 1

## 2024-02-07 MED ORDER — ALPRAZOLAM 0.5 MG PO TABS
1.0000 mg | ORAL_TABLET | Freq: Every evening | ORAL | Status: DC | PRN
  Administered 2024-02-08 – 2024-02-12 (×3): 1 mg via ORAL
  Filled 2024-02-07 (×3): qty 2

## 2024-02-07 MED ORDER — VANCOMYCIN HCL IN DEXTROSE 1-5 GM/200ML-% IV SOLN
1000.0000 mg | INTRAVENOUS | Status: DC
  Administered 2024-02-07: 1000 mg via INTRAVENOUS
  Filled 2024-02-07: qty 200

## 2024-02-07 MED ORDER — HYDROCORTISONE SOD SUC (PF) 100 MG IJ SOLR
100.0000 mg | Freq: Once | INTRAMUSCULAR | Status: AC
  Administered 2024-02-07: 100 mg via INTRAVENOUS
  Filled 2024-02-07: qty 2

## 2024-02-07 MED ORDER — PANTOPRAZOLE SODIUM 40 MG PO TBEC
40.0000 mg | DELAYED_RELEASE_TABLET | Freq: Every day | ORAL | Status: DC
  Administered 2024-02-07 – 2024-02-13 (×7): 40 mg via ORAL
  Filled 2024-02-07 (×7): qty 1

## 2024-02-07 MED ORDER — ALBUTEROL SULFATE (2.5 MG/3ML) 0.083% IN NEBU: 2.5000 mg | INHALATION_SOLUTION | RESPIRATORY_TRACT | Status: DC | PRN

## 2024-02-07 MED ORDER — DULOXETINE HCL 60 MG PO CPEP
60.0000 mg | ORAL_CAPSULE | Freq: Every day | ORAL | Status: DC
  Administered 2024-02-07 – 2024-02-13 (×7): 60 mg via ORAL
  Filled 2024-02-07 (×7): qty 1

## 2024-02-07 MED ORDER — MEDIHONEY WOUND/BURN DRESSING EX PSTE
1.0000 | PASTE | Freq: Every day | CUTANEOUS | Status: DC
  Administered 2024-02-07 – 2024-02-13 (×7): 1 via TOPICAL
  Filled 2024-02-07: qty 44

## 2024-02-07 MED ORDER — LACTATED RINGERS IV BOLUS
500.0000 mL | Freq: Once | INTRAVENOUS | Status: AC
Start: 2024-02-07 — End: 2024-02-07
  Administered 2024-02-07: 500 mL via INTRAVENOUS

## 2024-02-07 MED ORDER — VENLAFAXINE HCL ER 75 MG PO CP24
150.0000 mg | ORAL_CAPSULE | Freq: Every day | ORAL | Status: DC
  Administered 2024-02-08 – 2024-02-13 (×6): 150 mg via ORAL
  Filled 2024-02-07: qty 2
  Filled 2024-02-07: qty 1
  Filled 2024-02-07: qty 2
  Filled 2024-02-07: qty 1
  Filled 2024-02-07 (×2): qty 2

## 2024-02-07 MED ORDER — HYDROMORPHONE HCL 1 MG/ML IJ SOLN
0.5000 mg | INTRAMUSCULAR | Status: DC | PRN
  Administered 2024-02-07: 0.5 mg via INTRAVENOUS
  Filled 2024-02-07: qty 0.5

## 2024-02-07 MED ORDER — TAMSULOSIN HCL 0.4 MG PO CAPS
0.4000 mg | ORAL_CAPSULE | Freq: Every day | ORAL | Status: DC
  Administered 2024-02-07 – 2024-02-13 (×7): 0.4 mg via ORAL
  Filled 2024-02-07 (×7): qty 1

## 2024-02-07 MED ORDER — ALUM & MAG HYDROXIDE-SIMETH 200-200-20 MG/5ML PO SUSP
30.0000 mL | Freq: Once | ORAL | Status: AC
  Administered 2024-02-07: 30 mL via ORAL
  Filled 2024-02-07: qty 30

## 2024-02-07 MED ORDER — TIZANIDINE HCL 4 MG PO TABS
4.0000 mg | ORAL_TABLET | Freq: Three times a day (TID) | ORAL | Status: DC
  Administered 2024-02-07: 4 mg via ORAL
  Filled 2024-02-07 (×3): qty 1

## 2024-02-07 MED ORDER — MAGNESIUM SULFATE 2 GM/50ML IV SOLN
2.0000 g | Freq: Once | INTRAVENOUS | Status: AC
  Administered 2024-02-07: 2 g via INTRAVENOUS
  Filled 2024-02-07: qty 50

## 2024-02-07 MED ORDER — BUDESONIDE 0.5 MG/2ML IN SUSP
0.5000 mg | Freq: Two times a day (BID) | RESPIRATORY_TRACT | Status: DC
  Administered 2024-02-07 – 2024-02-13 (×12): 0.5 mg via RESPIRATORY_TRACT
  Filled 2024-02-07 (×12): qty 2

## 2024-02-07 MED ORDER — HYDROCORTISONE SOD SUC (PF) 100 MG IJ SOLR
100.0000 mg | Freq: Two times a day (BID) | INTRAMUSCULAR | Status: DC
  Administered 2024-02-07 – 2024-02-09 (×4): 100 mg via INTRAVENOUS
  Filled 2024-02-07 (×4): qty 2

## 2024-02-07 MED ORDER — GADOBUTROL 1 MMOL/ML IV SOLN
10.0000 mL | Freq: Once | INTRAVENOUS | Status: AC | PRN
  Administered 2024-02-07: 10 mL via INTRAVENOUS

## 2024-02-07 NOTE — Progress Notes (Signed)
 BP's have been liable throughout the day, previously off pressors but increased needs this afternoon despite pt being mostly appropriate, confused at times, maintaining alertness.    No UOP, bladder scan at 3pm < .   LR fluid bolus given and since aline and CVL placed for accurate BP measurement.  Cuff not correlating with aline on RUE  Blood pressure (!) 81/59, pulse 74, temperature 98.4 F (36.9 C), temperature source Oral, resp. rate (!) 21, height 5\' 11"  (1.803 m), weight (!) 144.6 kg, SpO2 96%.   P:  - s/p LR 500ml bolus - cont NE via CVL for MAP goal > 65, add vasopressin  - cont stress dose steroids and broad empiric abx - MRI completed, pending read        Early Glisson, MSN, AG-ACNP-BC [facility] 02/07/2024, 6:42 PM  See Amion for pager If no response to pager , please call [phone] until 7pm After 7:00 pm call Elink  [phone]

## 2024-02-07 NOTE — Progress Notes (Signed)
 Pharmacy Antibiotic Note  [name] is a 68 y.o. male admitted on 02/06/2024 with cellulitis.  Pharmacy has been consulted for vancomycin , cefepime  dosing.  Plan: Cefepime  2g IV q12h Vancomycin  2g IV x1 given in ED, then vancomycin  1g IV q24h (eAUC 519) -levels PRN per protocol -goal AUC 400-600  F/u renal function and culture data as able  F/u LOT  Height: 5\' 11"  (180.3 cm) Weight: (!) 144.6 kg (318 lb 12.6 oz) IBW/kg (Calculated) : 75.3  Temp (24hrs), Avg:98 F (36.7 C), Min:97.4 F (36.3 C), Max:98.7 F (37.1 C)  Recent Labs  Lab 02/03/24 1615 02/06/24 1320 02/06/24 2043 02/07/24 0235  WBC 13.2* 20.8*  --  28.6*  CREATININE 0.99 1.89*  --  2.77*  LATICACIDVEN  --   --  2.5* 1.5    Estimated Creatinine Clearance: 36.7 mL/min (A) (by C-G formula based on SCr of 2.77 mg/dL (H)).    Allergies  Allergen Reactions   Tape Other (See Comments)    SKIN TEARS VERY, VERY EASILY!!!! Use Paper Tape Only, PLEASE!!!   Amiodarone  Other (See Comments)    Caused hyperthyroidism    Statins Other (See Comments)    Myalgias    Sulfa  Antibiotics Other (See Comments)    Mouth ulcers   Wound Dressing Adhesive Other (See Comments)    Skin Tears Use Paper Tape Only    Antimicrobials this admission: Vanc 4/24> Cefepime  4/24>  Dose adjustments this admission:   Microbiology results: 4/24 MRSA Neg  4/24 BCX    Thank you for allowing pharmacy to be a part of this patient's care.  Oralee Billow, PharmD, BCCCP Clinical Pharmacist 02/07/2024 9:17 AM

## 2024-02-07 NOTE — Plan of Care (Signed)
  Problem: Education: Goal: Ability to describe self-care measures that may prevent or decrease complications (Diabetes Survival Skills Education) will improve Outcome: Progressing Goal: Individualized Educational Video(s) Outcome: Progressing   Problem: Coping: Goal: Ability to adjust to condition or change in health will improve Outcome: Progressing   Problem: Fluid Volume: Goal: Ability to maintain a balanced intake and output will improve Outcome: Progressing   Problem: Health Behavior/Discharge Planning: Goal: Ability to identify and utilize available resources and services will improve Outcome: Progressing Goal: Ability to manage health-related needs will improve Outcome: Progressing   Problem: Metabolic: Goal: Ability to maintain appropriate glucose levels will improve Outcome: Progressing   Problem: Nutritional: Goal: Maintenance of adequate nutrition will improve Outcome: Progressing Goal: Progress toward achieving an optimal weight will improve Outcome: Progressing   Problem: Skin Integrity: Goal: Risk for impaired skin integrity will decrease Outcome: Progressing   Problem: Tissue Perfusion: Goal: Adequacy of tissue perfusion will improve Outcome: Progressing   Problem: Health Behavior/Discharge Planning: Goal: Ability to manage health-related needs will improve Outcome: Progressing

## 2024-02-07 NOTE — Telephone Encounter (Signed)
 Called to confirm/remind patient of [facility] appointment [facility] on.   Appointment: PATIENT IS CURRENTLY ADMITTED.  [] Confirmed  [] Left mess   [] No answer/No voice mail  [] VM Full/unable to leave message  [] Phone not in service  Patient reminded to bring all medications and/or complete list.  Confirmed patient has transportation. Gave directions, instructed to utilize valet parking.

## 2024-02-07 NOTE — Consult Note (Addendum)
 WOC Nurse Consult Note: Consult requested for bilat leg wounds. This has already been performed by a WOC team member earlier this morning; refer to previous consult note at 0530, and topical treatment orders have been provided for bedside nurses to perform.  Please re-consult if further assistance is needed.  Thank-you,  Wiliam Harder MSN, RN, CWOCN, [place], CNS [phone]

## 2024-02-07 NOTE — Progress Notes (Signed)
 Pt has multiple BP readings appearing to be severely hypertensive.  Extremely difficult to get any vitals whatsoever while pt is having strong tremors in all 4 extremities.  Pt had been complaining of 10/10  pain.  Pt treated with po medication and then IV pain medication.  IV medication finally effective.  Pt becomes tremulous when care givers enter room.  Otherwise pt appears to be calm and without tremors when not stimulated.  Difficulty in titrating norepinephrine  due to inability to obtain accurate BP measurements.  Pt now with accurate readings and BP has normalized after restarting and up-titration of norepinephrine .  See flowsheet for VS.

## 2024-02-07 NOTE — TOC CM/SW Note (Signed)
 Transition of Care [facility]) - Inpatient Brief Assessment   Patient Details  Name: [name] MRN: [id number] Date of Birth: 12/27/1955  Transition of Care [facility]) CM/SW Contact:    Tom-Johnson, Angelique Ken, RN Phone Number: 02/07/2024, 2:22 PM   Clinical Narrative:  Patient presented to the ED with progressive Generalized Weakness. Patient was recently admitted to the hospital with Septic Shock 2/2 Gastroenteritis, Syncope and AKI requiring vasopressors.  Currently on 4L O2, uses 2L O2 at home baseline from [employer]. On IV abx. Patient noted to have Cellulitis to BLE with blisters, WOC following.   CM spoke with patient and daughter, Melodie at bedside about discharge disposition. CM got patient's permission to speak with Melodie in the room.  From home with his girlfriend, Amalia Badder whom is patient's caregiver. Patient has two children. Has a handicapped bathroom, cane, walker and shower seat at home.   PCP is Roslyn Coombe, MD and uses [facility] on [address].    Awaiting PT/OT eval for disposition.  Patient not Medically ready for discharge.  CM will continue to follow as patient progresses with care towards discharge.             Transition of Care Asessment: Insurance and Status: Insurance coverage has been reviewed Patient has primary care physician: Yes Home environment has been reviewed: Yes Prior level of function:: Modified Independent Prior/Current Home Services: No current home services (Caregiver is girlfriend.) Social Drivers of Health Review: SDOH reviewed no interventions necessary Readmission risk has been reviewed: Yes Transition of care needs: transition of care needs identified, TOC will continue to follow

## 2024-02-07 NOTE — Plan of Care (Signed)
  Problem: Education: Goal: Ability to describe self-care measures that may prevent or decrease complications (Diabetes Survival Skills Education) will improve Outcome: Progressing   Problem: Coping: Goal: Ability to adjust to condition or change in health will improve Outcome: Progressing   Problem: Health Behavior/Discharge Planning: Goal: Ability to identify and utilize available resources and services will improve Outcome: Progressing Goal: Ability to manage health-related needs will improve Outcome: Progressing   Problem: Metabolic: Goal: Ability to maintain appropriate glucose levels will improve Outcome: Progressing   Problem: Nutritional: Goal: Maintenance of adequate nutrition will improve Outcome: Progressing   Problem: Skin Integrity: Goal: Risk for impaired skin integrity will decrease Outcome: Progressing   Problem: Tissue Perfusion: Goal: Adequacy of tissue perfusion will improve Outcome: Progressing

## 2024-02-07 NOTE — Progress Notes (Signed)
 Pt b/p low restarted Levophed  see MAR

## 2024-02-07 NOTE — Progress Notes (Signed)
 Trevor Fudge NP at bedside attempting a line insertion.

## 2024-02-07 NOTE — Procedures (Signed)
 Arterial Catheter Insertion Procedure Note  TWAIN STENSETH  [id number]  1956/07/20  Date:02/07/24  Time:6:37 PM    Provider Performing: Armstead Bertrand    Procedure: Insertion of Arterial Line (13086) with US  guidance (57846)   Indication(s) Blood pressure monitoring and/or need for frequent ABGs  Consent Risks of the procedure as well as the alternatives and risks of each were explained to the patient and/or caregiver.  Consent for the procedure was obtained and is signed in the bedside chart  Anesthesia None   Time Out Verified patient identification, verified procedure, site/side was marked, verified correct patient position, special equipment/implants available, medications/allergies/relevant history reviewed, required imaging and test results available.   Sterile Technique Maximal sterile technique including full sterile barrier drape, hand hygiene, sterile gown, sterile gloves, mask, hair covering, sterile ultrasound probe cover (if used).   Procedure Description Area of catheter insertion was cleaned with chlorhexidine  and draped in sterile fashion. With real-time ultrasound guidance an arterial catheter was placed into the left femoral artery.  Appropriate arterial tracings confirmed on monitor.     Complications/Tolerance None; patient tolerated the procedure well.   EBL Minimal   Specimen(s) None

## 2024-02-07 NOTE — Procedures (Signed)
 Central Venous Catheter Insertion Procedure Note  MIKELL KAZLAUSKAS  [id number]  May 07, 1956  Date:02/07/24  Time:6:36 PM   Provider Performing:Pete Zachery Hermes Burna Carrier   Procedure: Insertion of Non-tunneled Central Venous [phone]) with US  guidance ([id number])   Indication(s) Difficult access  Consent Risks of the procedure as well as the alternatives and risks of each were explained to the patient and/or caregiver.  Consent for the procedure was obtained and is signed in the bedside chart  Anesthesia Topical only with 1% lidocaine    Timeout Verified patient identification, verified procedure, site/side was marked, verified correct patient position, special equipment/implants available, medications/allergies/relevant history reviewed, required imaging and test results available.  Sterile Technique Maximal sterile technique including full sterile barrier drape, hand hygiene, sterile gown, sterile gloves, mask, hair covering, sterile ultrasound probe cover (if used).  Procedure Description Area of catheter insertion was cleaned with chlorhexidine  and draped in sterile fashion.  With real-time ultrasound guidance a central venous catheter was placed into the left femoral vein. Nonpulsatile blood flow and easy flushing noted in all ports.  The catheter was sutured in place and sterile dressing applied.  Complications/Tolerance None; patient tolerated the procedure well. Chest X-ray is ordered to verify placement for internal jugular or subclavian cannulation.   Chest x-ray is not ordered for femoral cannulation.  EBL Minimal  Specimen(s) None

## 2024-02-07 NOTE — H&P (Signed)
 NAME:  [name], MRN:  [id number], DOB:  12/06/1955, LOS: 1 ADMISSION DATE:  02/06/2024, CONSULTATION DATE:  02/06/2024 REFERRING MD:  Paris Bolds, MD, CHIEF COMPLAINT:  Weakness   History of Present Illness:  68 y/o male with PMH for COPD now on 2 liters home O2 (started from last admission 2 weeks ago), OSA-not tolerant to PAP, Cervical spine fracture, Cardiomyopathy EF 40%,  s/p BiV ICD, Atrial Fibrillation on Eliquis , Peripheral Neuropathy and multiple other medical issues who was just d/c from [facility] ICU about 2 weeks ago and he was admitted at that time for Septic shock for viral gastro-enteritis with diarrhea, Syncopal episode and had AKI, requiring vasopressors.  He was d/c home and says he was able to walk with a walker and his girlfriend was helping to take care of him. He now returns with generalized weakness starting this morning.  He was not able to get up from a chair.   During his ED stay he became hypotensive requiring IV Vasopressors.  He was noted to have an AKI with Cr 1.89 from 0.99 3 days before.  His WBC was 20.8, Trop 21, (BNP 56 on 4/21). LLE noted to be warm/red and painful.  Pertinent  Medical History  AICD (automatic cardioverter/defibrillator) present, Anemia, ANXIETY, Arthritis, Asthma, Cardiomyopathy (HCC), Chronic constipation, COPD (chronic obstructive pulmonary disease) (HCC), COVID-19 (12/09/2019), DEPRESSION, Diverticulitis, DYSKINESIA, ESOPHAGUS, Essential hypertension, FIBROMYALGIA, GERD (gastroesophageal reflux disease), Glaucoma, HYPERLIPIDEMIA, INSOMNIA, Myocardial infarction (HCC), O2 dependent, Paroxysmal atrial fibrillation (HCC), Peripheral neuropathy, Pneumonia (12/2016), Rash and other nonspecific skin eruption (04/12/2009), SLEEP APNEA, OBSTRUCTIVE, Syncope, and Type II diabetes mellitus (HCC).   Significant Hospital Events: Including procedures, antibiotic start and stop dates in addition to other pertinent events   4/24: Admit to ICU for  Septic Shock, LLE cellulitis  Interim History / Subjective:  Currently off NE.  BP's difficult at times to obtain due to tremors  Objective   Blood pressure 109/86, pulse 77, temperature 97.7 F (36.5 C), temperature source Oral, resp. rate 11, height 5\' 11"  (1.803 m), weight (!) 144.6 kg, SpO2 95%.        Intake/Output Summary (Last 24 hours) at 02/07/2024 0931 Last data filed at 02/07/2024 0500 Gross per 24 hour  Intake 113.44 ml  Output --  Net 113.44 ml   Filed Weights   02/07/24 0500  Weight: (!) 144.6 kg    Examination: General:  chronically ill, obese, appearing male sitting in bed, no acute distress, appears uncomfortable HEENT: MM pink/moist, contracture of head to right, glasses Neuro: Awake, appropriate, MAE, slight bilateral hand tremor CV: vpaced PULM:  intermittently tachypneic at times, clear anteriorly, diminished in bases  GI: soft, bs+, protuberant, ND Extremities: warm/dry, LLE with continued erythema, warmth up to knee, blister to anterior shin as below    Afebrile Labs> BUN/sCr 31/ 1.89> 41/ 2.77, Mag 1.5, K 4.8, lacic 2.5> 1.5, WBC 20.8> 28.6  Resolved Hospital Problem list   N/a  Assessment & Plan:   Septic shock 2/2 to LLE cellulitis Hx relative AI (chronic prednisone  2/2 shoulder arthritis per pt) P:  - no subcutaneous air on xry.  MRI w/wo LLE for further evaluation  - cont vanc/ cefepime , follow cultures - WOC consult - hydrocortisone  x 1 today, resume oral home prednisone  4/26 - cont NE for MAP > 65, currently off    AKI - 2/2 hypotension/ sepsis P:  - pt reports still having difficulty voiding.  Bladder scan to r/o retention, I/O as needed.  Send  UA and check urine lytes - resume home flomax  - trend renal indices - strict I/Os, daily wts - avoid nephrotoxins, renal dose meds, hemodynamic support as above   Hx NICM, -EF 40%, S/p BiV ICD/pacemaker HTN - Negative trops and BNP 56, no s/s of exacerbation - cont to monitor volume  status closely.  Hold home diurectics/ GDMT for now (coreg ,    Atrial fibrillation on Eliquis  - currently paced.  Continue eliquis .  Optimize electrolytes    Hx COPD/ chronic hypoxic respiratory failure  OSA intolerant to CPAP - cont supplemental O2 for sat goal > 88% - triple therapy nebs   Chronic pain  Anxiety/ depression - prior cervical spine fx 05/2023/ contraction of neck - multimodal pain management w/ bowel regimen - restart home xanax , duloxetine , tizanidine , effexor     Hypoglycemia - d/c SSI prn given hypoglycemia overnight.  A1c 6 in March.  Start diet  Chronic Debility - PT/ OT   Best Practice (right click and "Reselect all SmartList Selections" daily)   Diet/type: Regular consistency (see orders) DVT prophylaxis DOAC Pressure ulcer(s): N/A GI prophylaxis: n/a Lines: N/A Foley:  N/A Code Status:  full code  Pt updated on plan of care 4/25   Labs   CBC: Recent Labs  Lab 02/03/24 1615 02/06/24 1320 02/07/24 0235  WBC 13.2* 20.8* 28.6*  NEUTROABS  --  20.0*  --   HGB 11.4* 10.2* 11.0*  HCT 38.5* 36.3* 36.5*  MCV 99.7 107.7* 100.8*  PLT 340 233 291    Basic Metabolic Panel: Recent Labs  Lab 02/03/24 1615 02/06/24 1320 02/07/24 0235  NA 139 136 135  K 4.9 3.8 4.8  CL 103 101 97*  CO2 28 23 24   GLUCOSE 105* 71 66*  BUN 26* 31* 41*  CREATININE 0.99 1.89* 2.77*  CALCIUM  9.2 8.4* 8.3*  MG 2.5*  --  1.5*  PHOS  --   --  6.2*   GFR: Estimated Creatinine Clearance: 36.7 mL/min (A) (by C-G formula based on SCr of 2.77 mg/dL (H)). Recent Labs  Lab 02/03/24 1615 02/06/24 1320 02/06/24 2043 02/07/24 0235  WBC 13.2* 20.8*  --  28.6*  LATICACIDVEN  --   --  2.5* 1.5    Liver Function Tests: Recent Labs  Lab 02/03/24 1615 02/06/24 1320  AST 21 25  ALT 31 28  ALKPHOS 58 67  BILITOT 0.6 0.8  PROT 6.1* 5.1*  ALBUMIN  3.4* 2.8*   No results for input(s): "LIPASE", "AMYLASE" in the last 168 hours. No results for input(s): "AMMONIA"  in the last 168 hours.  ABG    Component Value Date/Time   HCO3 27.6 02/06/2024 1427   TCO2 29 01/28/2017 1222   TCO2 31 01/28/2017 1222   ACIDBASEDEF 1.0 03/21/2016 1101   O2SAT 69.3 02/06/2024 1427     Coagulation Profile: No results for input(s): "INR", "PROTIME" in the last 168 hours.  Cardiac Enzymes: No results for input(s): "CKTOTAL", "CKMB", "CKMBINDEX", "TROPONINI" in the last 168 hours.  HbA1C: Hgb A1c MFr Bld  Date/Time Value Ref Range Status  01/07/2024 03:46 PM 6.0 4.6 - 6.5 % Final    Comment:    Glycemic Control Guidelines for People with Diabetes:Non Diabetic:  <6%Goal of Therapy: <7%Additional Action Suggested:  >8%   07/01/2023 02:39 PM 5.9 4.6 - 6.5 % Final    Comment:    Glycemic Control Guidelines for People with Diabetes:Non Diabetic:  <6%Goal of Therapy: <7%Additional Action Suggested:  >8%     CBG: Recent Labs  Lab 02/06/24 2127 02/06/24 2236 02/07/24 0308 02/07/24 0733  GLUCAP 70 70 83 82   Allergies Allergies  Allergen Reactions   Tape Other (See Comments)    SKIN TEARS VERY, VERY EASILY!!!! Use Paper Tape Only, PLEASE!!!   Amiodarone  Other (See Comments)    Caused hyperthyroidism    Statins Other (See Comments)    Myalgias    Sulfa  Antibiotics Other (See Comments)    Mouth ulcers   Wound Dressing Adhesive Other (See Comments)    Skin Tears Use Paper Tape Only     Home Medications  Prior to Admission medications   Medication Sig Start Date End Date Taking? Authorizing Provider  albuterol  (PROVENTIL ) (2.5 MG/3ML) 0.083% nebulizer solution Take 3 mLs (2.5 mg total) by nebulization every 6 (six) hours as needed for wheezing or shortness of breath. 10/17/23   Quillian Brunt, MD  albuterol  (VENTOLIN  HFA) [phone] Base) MCG/ACT inhaler INHALE 1-2 PUFFS BY MOUTH EVERY 6 HOURS AS NEEDED FOR WHEEZE OR SHORTNESS OF BREATH 11/22/23   Quillian Brunt, MD  ALPRAZolam  (XANAX ) 0.5 MG tablet TAKE 2 TABLETS (1 MG TOTAL) BY MOUTH EVERY DAY AT  BEDTIME 12/13/23   Roslyn Coombe, MD  ammonium lactate  (AMLACTIN) 12 % cream Apply 1 Application topically as needed for dry skin. 12/25/22   McDonald, Olive Better, DPM  apixaban  (ELIQUIS ) 5 MG TABS tablet Take 1 tablet (5 mg total) by mouth 2 (two) times daily. 06/13/23   Darlis Eisenmenger, MD  arformoterol  (BROVANA ) 15 MCG/2ML NEBU Take 2 mLs (15 mcg total) by nebulization every 12 (twelve) hours. 01/27/24   Raejean Bullock, NP  ascorbic acid (VITAMIN C) 500 MG tablet Take 500 mg by mouth daily.    [provider]  augmented betamethasone  dipropionate (DIPROLENE -AF) 0.05 % cream Apply 1 application  topically daily as needed (to affected areas- for itching). 10/11/20   [provider]  carisoprodol  (SOMA ) 350 MG tablet Take 1 tablet (350 mg total) by mouth 4 (four) times daily. 12/24/23   Roslyn Coombe, MD  Cholecalciferol  (VITAMIN D3) 1000 units CAPS Take 1,000 Units by mouth daily.    [provider]  clotrimazole -betamethasone  (LOTRISONE ) cream Apply 1 Application topically daily. 02/22/23   Henson, Vickie L, NP-C  doxepin  (SINEQUAN ) 10 MG capsule TAKE 2 CAPSULES BY MOUTH AT BEDTIME AS NEEDED 09/26/23   Roslyn Coombe, MD  DULoxetine  (CYMBALTA ) 60 MG capsule TAKE 1 CAPSULE BY MOUTH EVERY DAY 05/20/23   Roslyn Coombe, MD  Dupilumab (DUPIXENT) 300 MG/2ML SOPN Inject 300 mg into the skin every 14 (fourteen) days.    [provider]  Evolocumab  (REPATHA  SURECLICK) 140 MG/ML SOAJ Inject 140 mg into the skin every 14 (fourteen) days. 01/24/23   Darlis Eisenmenger, MD  fluconazole  (DIFLUCAN ) 100 MG tablet Take 1 tablet (100 mg total) by mouth daily. 10/15/23   Roslyn Coombe, MD  fluticasone  (FLONASE ) 50 MCG/ACT nasal spray Place 2 sprays into both nostrils daily as needed for allergies or rhinitis. 07/17/23   Roslyn Coombe, MD  fluticasone -salmeterol (WIXELA INHUB) 250-50 MCG/ACT AEPB Inhale 1 puff into the lungs in the morning and at bedtime. 01/21/24   Quillian Brunt, MD  gabapentin   (NEURONTIN ) 100 MG capsule Take 1 capsule (100 mg total) by mouth 3 (three) times daily. 01/16/24   Semon, Vann Gent, DPM  hydrOXYzine  (ATARAX ) 10 MG tablet TAKE 1 TABLET BY MOUTH 3 TIMES DAILY AS NEEDED FOR ITCHING. Strength: 10 mg 12/28/22  Roslyn Coombe, MD  ipratropium-albuterol  (DUONEB) 0.5-2.5 (3) MG/3ML SOLN TAKE 3 ML BY NEBULIZATION EVERY 4 HOURS AS NEEDED (WHEEZING). 01/29/23   Roslyn Coombe, MD  ketoconazole  (NIZORAL ) 2 % cream Apply 1 Application topically daily. 10/15/23   Roslyn Coombe, MD  losartan  (COZAAR ) 25 MG tablet Take 1 tablet (25 mg total) by mouth daily. 01/17/23   Ruddy Corral M, PA-C  nystatin  (MYCOSTATIN /NYSTOP ) powder Use as directed twice per day as needed to the affected area 05/23/23   Roslyn Coombe, MD  omeprazole  (PRILOSEC) 20 MG capsule TAKE 1 CAPSULE BY MOUTH TWICE A DAY 10/31/23   Roslyn Coombe, MD  potassium chloride  SA (KLOR-CON  M20) 20 MEQ tablet Take 1 tablet (20 mEq total) by mouth daily. TAKE (2 TABLETS) IN THE MORNING Patient taking differently: Take 20 mEq by mouth daily. TAKE 20 MEQ (1 TABLETS) IN THE MORNING 09/04/23   Darlis Eisenmenger, MD  predniSONE  (DELTASONE ) 10 MG tablet TAKE 1 TABLET (10 MG TOTAL) BY MOUTH DAILY WITH BREAKFAST. 01/17/24   Isidro Margo, DO  silver  sulfADIAZINE  (SILVADENE ) 1 % cream APPLY TOPICALLY TO AFFECTED AREA EVERY DAY 02/25/23   Roslyn Coombe, MD  tamsulosin  (FLOMAX ) 0.4 MG CAPS capsule TAKE 1 CAPSULE BY MOUTH EVERY DAY 03/12/23   Roslyn Coombe, MD  Tiotropium Bromide  Monohydrate (SPIRIVA  RESPIMAT) 2.5 MCG/ACT AERS Take 2 puffs by nebulization daily. 01/28/24   Raejean Bullock, NP  tirzepatide [facility]) 5 MG/0.5ML Pen Inject 5 mg into the skin once a week. 01/30/24   Roslyn Coombe, MD  tiZANidine  (ZANAFLEX ) 4 MG tablet Take 4 mg by mouth 3 (three) times daily. 12/28/23   [provider]  torsemide  (DEMADEX ) 20 MG tablet Take 5 tablets (100 mg total) by mouth 2 (two) times daily. 02/03/24   Milford, Arlice Bene, FNP   traMADol  (ULTRAM ) 50 MG tablet Take 1 tablet (50 mg total) by mouth every 6 (six) hours as needed. 02/04/24   Roslyn Coombe, MD  traZODone  (DESYREL ) 100 MG tablet Take 200 mg by mouth at bedtime as needed. 01/08/24   [provider]  TUMS 500 MG chewable tablet Chew 500-2,000 mg by mouth every 4 (four) hours as needed for indigestion or heartburn. 10/19/16   [provider]  valACYclovir  (VALTREX ) 1000 MG tablet Take 1 tablet (1,000 mg total) by mouth 3 (three) times daily. 09/18/23   Roslyn Coombe, MD  venlafaxine  XR (EFFEXOR -XR) 150 MG 24 hr capsule TAKE 1 CAPSULE BY MOUTH DAILY WITH BREAKFAST. 07/09/23   Roslyn Coombe, MD  zolpidem  (AMBIEN ) 10 MG tablet Take 1 tablet (10 mg total) by mouth at bedtime as needed for sleep. 09/18/23   Roslyn Coombe, MD     Critical care time: 56        Early Glisson, MSN, AG-ACNP-BC [facility] 02/07/2024, 9:32 AM  See Amion for pager If no response to pager , please call [phone] until 7pm After 7:00 pm call Elink  [phone]

## 2024-02-07 NOTE — Progress Notes (Signed)
 Dr Villa Greaser aware of inconsistent b/p see MAR

## 2024-02-07 NOTE — Consult Note (Signed)
 WOC Nurse Consult Note: this patient is known to WOC team from previous admission when patient had sustained a skin tear to L leg post fall; readmitted with L leg cellulitis  Reason for Consult: pre-existing wound  Wound type: full thickness post trauma now infectious  Pressure Injury POA: NA  Measurement: see nursing flowsheet  Wound WGN:FAOZ distal to L knee appears an intact serum filled blister, L anterior lower leg 75% yellow necrotic 25% black eschar  Drainage (amount, consistency, odor) see nursing flowsheet  Periwound: edema and erythema  Dressing procedure/placement/frequency: Cleanse L leg wounds with Vashe wound cleanser Timm Foot [phone]) do not rinse and allow to air dry.  Apply Xeroform gauze ([place] [phone]) to serum filled blister (distal to knee).  Apply Medihoney to necrotic wound bed daily, cover with dry gauze and ABD pad.  Secure with Kerlix roll gauze anchored around foot and ending right below knee.    Patient is in ICU setting and receiving IV antibiotics at this time.  Patient would benefit from referral to wound care center for ongoing management of this wound as outpatient.    POC discussed with bedside nurse. WOC team will not follow. Re-consult if further needs arise.   Thank you,    Ronni Colace MSN, RN-BC, [employer] [phone]

## 2024-02-08 DIAGNOSIS — N179 Acute kidney failure, unspecified: Secondary | ICD-10-CM | POA: Diagnosis not present

## 2024-02-08 DIAGNOSIS — I10 Essential (primary) hypertension: Secondary | ICD-10-CM | POA: Diagnosis not present

## 2024-02-08 DIAGNOSIS — R6521 Severe sepsis with septic shock: Secondary | ICD-10-CM | POA: Diagnosis not present

## 2024-02-08 DIAGNOSIS — A419 Sepsis, unspecified organism: Secondary | ICD-10-CM | POA: Diagnosis not present

## 2024-02-08 DIAGNOSIS — I4891 Unspecified atrial fibrillation: Secondary | ICD-10-CM

## 2024-02-08 LAB — URINALYSIS, ROUTINE W REFLEX MICROSCOPIC
Bilirubin Urine: NEGATIVE
Glucose, UA: NEGATIVE mg/dL
Ketones, ur: 5 mg/dL — AB
Nitrite: NEGATIVE
Protein, ur: 30 mg/dL — AB
Specific Gravity, Urine: 1.024 (ref 1.005–1.030)
pH: 5 (ref 5.0–8.0)

## 2024-02-08 LAB — POCT I-STAT 7, (LYTES, BLD GAS, ICA,H+H)
Acid-base deficit: 3 mmol/L — ABNORMAL HIGH (ref 0.0–2.0)
Bicarbonate: 24.3 mmol/L (ref 20.0–28.0)
Calcium, Ion: 1.11 mmol/L — ABNORMAL LOW (ref 1.15–1.40)
HCT: 30 % — ABNORMAL LOW (ref 39.0–52.0)
Hemoglobin: 10.2 g/dL — ABNORMAL LOW (ref 13.0–17.0)
O2 Saturation: 91 %
Patient temperature: 98.1
Potassium: 4.9 mmol/L (ref 3.5–5.1)
Sodium: 127 mmol/L — ABNORMAL LOW (ref 135–145)
TCO2: 26 mmol/L (ref 22–32)
pCO2 arterial: 51.6 mmHg — ABNORMAL HIGH (ref 32–48)
pH, Arterial: 7.28 — ABNORMAL LOW (ref 7.35–7.45)
pO2, Arterial: 68 mmHg — ABNORMAL LOW (ref 83–108)

## 2024-02-08 LAB — CBC
HCT: 30.3 % — ABNORMAL LOW (ref 39.0–52.0)
Hemoglobin: 9.6 g/dL — ABNORMAL LOW (ref 13.0–17.0)
MCH: 30.2 pg (ref 26.0–34.0)
MCHC: 31.7 g/dL (ref 30.0–36.0)
MCV: 95.3 fL (ref 80.0–100.0)
Platelets: 286 10*3/uL (ref 150–400)
RBC: 3.18 MIL/uL — ABNORMAL LOW (ref 4.22–5.81)
RDW: 14.8 % (ref 11.5–15.5)
WBC: 22.7 10*3/uL — ABNORMAL HIGH (ref 4.0–10.5)
nRBC: 0 % (ref 0.0–0.2)

## 2024-02-08 LAB — BASIC METABOLIC PANEL WITH GFR
Anion gap: 14 (ref 5–15)
Anion gap: 14 (ref 5–15)
BUN: 60 mg/dL — ABNORMAL HIGH (ref 8–23)
BUN: 66 mg/dL — ABNORMAL HIGH (ref 8–23)
CO2: 22 mmol/L (ref 22–32)
CO2: 23 mmol/L (ref 22–32)
Calcium: 7.7 mg/dL — ABNORMAL LOW (ref 8.9–10.3)
Calcium: 8.1 mg/dL — ABNORMAL LOW (ref 8.9–10.3)
Chloride: 93 mmol/L — ABNORMAL LOW (ref 98–111)
Chloride: 92 mmol/L — ABNORMAL LOW (ref 98–111)
Creatinine, Ser: 3.19 mg/dL — ABNORMAL HIGH (ref 0.61–1.24)
Creatinine, Ser: 2.93 mg/dL — ABNORMAL HIGH (ref 0.61–1.24)
GFR, Estimated: 20 mL/min — ABNORMAL LOW (ref >60)
GFR, Estimated: 22 mL/min — ABNORMAL LOW (ref >60)
Glucose, Bld: 119 mg/dL — ABNORMAL HIGH (ref 70–99)
Glucose, Bld: 114 mg/dL — ABNORMAL HIGH (ref 70–99)
Potassium: 4.6 mmol/L (ref 3.5–5.1)
Potassium: 4.4 mmol/L (ref 3.5–5.1)
Sodium: 129 mmol/L — ABNORMAL LOW (ref 135–145)
Sodium: 129 mmol/L — ABNORMAL LOW (ref 135–145)

## 2024-02-08 LAB — MAGNESIUM: Magnesium: 2.2 mg/dL (ref 1.7–2.4)

## 2024-02-08 LAB — SODIUM, URINE, RANDOM: Sodium, Ur: 10 mmol/L

## 2024-02-08 LAB — GLUCOSE, CAPILLARY
Glucose-Capillary: 133 mg/dL — ABNORMAL HIGH (ref 70–99)
Glucose-Capillary: 102 mg/dL — ABNORMAL HIGH (ref 70–99)
Glucose-Capillary: 103 mg/dL — ABNORMAL HIGH (ref 70–99)
Glucose-Capillary: 103 mg/dL — ABNORMAL HIGH (ref 70–99)
Glucose-Capillary: 98 mg/dL (ref 70–99)
Glucose-Capillary: 103 mg/dL — ABNORMAL HIGH (ref 70–99)

## 2024-02-08 LAB — CREATININE, URINE, RANDOM: Creatinine, Urine: 130 mg/dL

## 2024-02-08 MED ORDER — QUETIAPINE FUMARATE 25 MG PO TABS
25.0000 mg | ORAL_TABLET | Freq: Every day | ORAL | Status: DC
Start: 2024-02-08 — End: 2024-02-13
  Administered 2024-02-08 – 2024-02-12 (×6): 25 mg via ORAL
  Filled 2024-02-08 (×6): qty 1

## 2024-02-08 MED ORDER — ENSURE ENLIVE PO LIQD
237.0000 mL | Freq: Two times a day (BID) | ORAL | Status: DC
  Administered 2024-02-08 – 2024-02-13 (×8): 237 mL via ORAL

## 2024-02-08 MED ORDER — ALBUMIN HUMAN 5 % IV SOLN
25.0000 g | Freq: Once | INTRAVENOUS | Status: AC
  Administered 2024-02-08: 25 g via INTRAVENOUS
  Filled 2024-02-08: qty 500

## 2024-02-08 MED ORDER — VANCOMYCIN VARIABLE DOSE PER UNSTABLE RENAL FUNCTION (PHARMACIST DOSING): Status: DC

## 2024-02-08 NOTE — Progress Notes (Addendum)
 eLink Physician-Brief Progress Note Patient Name: [name] DOB: 1956/02/02 MRN: [id number]   Date of Service  02/08/2024  HPI/Events of Note  Patient with mild confusion and hallucinating with bugs on the wall. Of note, recently hospitalized and discharged 4/14 and readmitted on 4/24 for septic shock 2/2 cellulitis. Camera check seems consistent with hypoactive delirium. May be exacerbated by acute illness, steroid use  eICU Interventions  Check ABG Low dose seroquel ordered nightly Re-orient as needed   3:12 AM ABG mild respiratory acidosis. Can trial BiPAP for improvement if patient can tolerate.  Intervention Category Minor Interventions: Agitation / anxiety - evaluation and management  [name] [name] 02/08/2024, 12:30 AM

## 2024-02-08 NOTE — Progress Notes (Signed)
 Pharmacy Antibiotic Note  [name] is a 68 y.o. male admitted on 02/06/2024 with cellulitis.  Pharmacy has been consulted for vancomycin , cefepime  dosing.  Scr up to 3.19 (from 0.99 on 4/21). Received 2 doses of vancomycin  during this admission (last 1000 mg ~ 2100 on 4/26).   Plan: Cefepime  2g IV q12h Vancomycin  variable dose per unstable renal function   Random level tomorrow AM  F/u renal function and culture data as able  F/u LOT  Height: 5\' 11"  (180.3 cm) Weight: (!) 144.6 kg (318 lb 12.6 oz) IBW/kg (Calculated) : 75.3  Temp (24hrs), Avg:98.3 F (36.8 C), Min:98.1 F (36.7 C), Max:98.4 F (36.9 C)  Recent Labs  Lab 02/03/24 1615 02/06/24 1320 02/06/24 2043 02/07/24 0235 02/08/24 0430  WBC 13.2* 20.8*  --  28.6* 22.7*  CREATININE 0.99 1.89*  --  2.77* 3.19*  LATICACIDVEN  --   --  2.5* 1.5  --     Estimated Creatinine Clearance: 31.8 mL/min (A) (by C-G formula based on SCr of 3.19 mg/dL (H)).    Allergies  Allergen Reactions   Tape Other (See Comments)    SKIN TEARS VERY, VERY EASILY!!!! Use Paper Tape Only, PLEASE!!!   Amiodarone  Other (See Comments)    Caused hyperthyroidism    Statins Other (See Comments)    Myalgias    Sulfa  Antibiotics Other (See Comments)    Mouth ulcers   Wound Dressing Adhesive Other (See Comments)    Skin Tears Use Paper Tape Only    Antimicrobials this admission: Vanc 4/24> Cefepime  4/24>  Microbiology results: 4/24 MRSA Neg  4/24 BCX    Thank you for allowing pharmacy to be a part of this patient's care.  Patience Bonito, PharmD, BCPS, BCCCP Clinical Pharmacist

## 2024-02-08 NOTE — Evaluation (Signed)
 Physical Therapy Evaluation Patient Details Name: [name] MRN: [id number] DOB: [birth year] Today's Date: 02/08/2024  History of Present Illness  68 year old male admitted 02/07/24 with weakness, AKI, hypotension, LLE cellulitis. PMHx recent admit for sepsis 4/11-4/14 due to viral gastroenteritis. COPD on 2L O2, asthma overlap syndrome, OSA, T2DM, HTN, A-fib, cardiomyopathy EF 40%, s/p BiV ICD, torticollis, cervical fx, neuropathy  Clinical Impression  Pt awake and alert but confused and searching for Tums in hospital gown. Pt with decreased awareness, memory and command following currently requiring max +2 assist to attempt any mobility and could not fully stand. Pt was living with girlfriend and walking PTA per last P.T. notes on prior admission and pt statement. Pt with significant decline in functiong and cognition. Pt will benefit from acute therapy to maximize mobility, safety and function to decrease burden of care. Patient will benefit from continued inpatient follow up therapy, <3 hours/day  Pt with O2 off on arrival with SPO2 88%, returned 2L and SPO2 >90% during session HR 71         If plan is discharge home, recommend the following: Assistance with cooking/housework;Assist for transportation;Help with stairs or ramp for entrance;Two people to help with walking and/or transfers;Two people to help with bathing/dressing/bathroom;Direct supervision/assist for medications management;Direct supervision/assist for financial management;Supervision due to cognitive status   Can travel by private vehicle   No    Equipment Recommendations Wheelchair (measurements PT);Hoyer lift;Hospital bed;Wheelchair cushion (measurements PT)  Recommendations for Other Services  OT consult    Functional Status Assessment Patient has had a recent decline in their functional status and/or demonstrates limited ability to make significant improvements in function in a reasonable and predictable amount of  time     Precautions / Restrictions Precautions Precautions: Fall;Other (comment) Recall of Precautions/Restrictions: Impaired Precaution/Restrictions Comments: monitor BP and O2      Mobility  Bed Mobility Overal bed mobility: Needs Assistance Bed Mobility: Supine to Sit, Sit to Supine     Supine to sit: Mod assist, +2 for physical assistance Sit to supine: +2 for physical assistance, Max assist   General bed mobility comments: utilized foot egress positioning to achieve full sitting with mod +2 assist to scoot hips to EOB with max multimodal cues for sequene. repositioned in supine with max +2 to slide toward [facility] then ultimately return to chair position with pt denying need for cushion under either hip    Transfers Overall transfer level: Needs assistance   Transfers: Sit to/from Stand Sit to Stand: Max assist, +2 physical assistance           General transfer comment: max +2 to rise from foot egress chair position with use of pad and assist at axilla. Pt able to get air under sacrum but could not fully rise x 2 trials    Ambulation/Gait               General Gait Details: unable  Stairs            Wheelchair Mobility     Tilt Bed    Modified Rankin (Stroke Patients Only)       Balance Overall balance assessment: Needs assistance Sitting-balance support: Feet supported, Bilateral upper extremity supported Sitting balance-Leahy Scale: Fair     Standing balance support: Bilateral upper extremity supported, During functional activity, Reliant on assistive device for balance Standing balance-Leahy Scale: Zero Standing balance comment: trunk flexed, bil UB support, unable to achieve upright  Pertinent Vitals/Pain Pain Assessment Pain Assessment: 0-10 Pain Score: 4  Pain Location: back, sacrum, indigestion Pain Descriptors / Indicators: Aching, Sore, Grimacing Pain Intervention(s): Limited activity  within patient's tolerance, Monitored during session, Repositioned, Patient requesting pain meds-RN notified    Home Living Family/patient expects to be discharged to:: Private residence Living Arrangements: Spouse/significant other Available Help at Discharge: Family;Available 24 hours/day Type of Home: House Home Access: Stairs to enter Entrance Stairs-Rails: Right Entrance Stairs-Number of Steps: 1   Home Layout: One level Home Equipment: Pharmacist, hospital (2 wheels);Cane - single point;Grab bars - tub/shower;Hand held shower head Additional Comments: Pt reports he typically sleeps in his recliner.    Prior Function Prior Level of Function : Independent/Modified Independent;History of Falls (last six months);Needs assist             Mobility Comments: was using RW PTA ADLs Comments: Independent to Mod I with ADLs. Girlfriend assists with cooking, other home management tasks as needed, and transportation. Pt is a Surveyor, minerals and retired Education administrator.     Extremity/Trunk Assessment   Upper Extremity Assessment Upper Extremity Assessment: Generalized weakness    Lower Extremity Assessment Lower Extremity Assessment: Generalized weakness    Cervical / Trunk Assessment Cervical / Trunk Assessment: Kyphotic;Other exceptions Cervical / Trunk Exceptions: increased body habitus, torticollis  Communication   Communication Communication: No apparent difficulties    Cognition Arousal: Alert Behavior During Therapy: Flat affect   PT - Cognitive impairments: Orientation, Attention, Initiation, Problem solving, Safety/Judgement, Memory   Orientation impairments: Time                   PT - Cognition Comments: pt not oriented to day of the week, consistently searching gown for tums, delayed response and awareness with no apparent concern for current lack of ability to get OOB Following commands: Impaired Following commands impaired: Follows one step commands with  increased time, Follows one step commands inconsistently     Cueing Cueing Techniques: Verbal cues, Gestural cues, Tactile cues     General Comments      Exercises     Assessment/Plan    PT Assessment Patient needs continued PT services  PT Problem List Decreased strength;Decreased range of motion;Decreased activity tolerance;Decreased balance;Decreased mobility;Decreased knowledge of use of DME;Cardiopulmonary status limiting activity;Decreased skin integrity;Obesity;Pain;Decreased cognition;Decreased coordination;Decreased safety awareness       PT Treatment Interventions DME instruction;Gait training;Stair training;Functional mobility training;Therapeutic activities;Therapeutic exercise;Balance training;Neuromuscular re-education;Patient/family education;Modalities;Cognitive remediation    PT Goals (Current goals can be found in the Care Plan section)  Acute Rehab PT Goals Patient Stated Goal: be able to walk PT Goal Formulation: With patient Time For Goal Achievement: 02/22/24 Potential to Achieve Goals: Fair    Frequency Min 2X/week     Co-evaluation               AM-PAC PT "6 Clicks" Mobility  Outcome Measure Help needed turning from your back to your side while in a flat bed without using bedrails?: A Lot Help needed moving from lying on your back to sitting on the side of a flat bed without using bedrails?: A Lot Help needed moving to and from a bed to a chair (including a wheelchair)?: Total Help needed standing up from a chair using your arms (e.g., wheelchair or bedside chair)?: Total Help needed to walk in hospital room?: Total Help needed climbing 3-5 steps with a railing? : Total 6 Click Score: 8    End of Session Equipment Utilized During Treatment: Oxygen   Activity Tolerance: Patient limited by fatigue Patient left: in bed;with call bell/phone within reach;with bed alarm set Nurse Communication: Mobility status;Need for lift equipment PT Visit  Diagnosis: Unsteadiness on feet (R26.81);Other abnormalities of gait and mobility (R26.89);Muscle weakness (generalized) (M62.81);History of falling (Z91.81);Difficulty in walking, not elsewhere classified (R26.2)    Time: 1610-9604 PT Time Calculation (min) (ACUTE ONLY): 21 min   Charges:   PT Evaluation $PT Eval Moderate Complexity: 1 Mod   PT General Charges $$ ACUTE PT VISIT: 1 Visit         Annis Baseman, PT Acute Rehabilitation Services Office: [phone]   [name] 02/08/2024, 12:04 PM

## 2024-02-08 NOTE — Progress Notes (Signed)
 NAME:  [name], MRN:  [id number], DOB:  08-13-1956, LOS: 2 ADMISSION DATE:  02/06/2024, CONSULTATION DATE:  02/06/2024 REFERRING MD:  Paris Bolds, MD, CHIEF COMPLAINT:  Weakness   History of Present Illness:  68 y/o male with PMH for COPD now on 2 liters home O2 (started from last admission 2 weeks ago), OSA-not tolerant to PAP, Cervical spine fracture, Cardiomyopathy EF 40%,  s/p BiV ICD, Atrial Fibrillation on Eliquis , Peripheral Neuropathy and multiple other medical issues who was just d/c from [facility] ICU about 2 weeks ago and he was admitted at that time for Septic shock for viral gastro-enteritis with diarrhea, Syncopal episode and had AKI, requiring vasopressors.  He was d/c home and says he was able to walk with a walker and his girlfriend was helping to take care of him. He now returns with generalized weakness starting this morning.  He was not able to get up from a chair.   During his ED stay he became hypotensive requiring IV Vasopressors.  He was noted to have an AKI with Cr 1.89 from 0.99 3 days before.  His WBC was 20.8, Trop 21, (BNP 56 on 4/21). LLE noted to be warm/red and painful.  Pertinent  Medical History  AICD (automatic cardioverter/defibrillator) present, Anemia, ANXIETY, Arthritis, Asthma, Cardiomyopathy (HCC), Chronic constipation, COPD (chronic obstructive pulmonary disease) (HCC), COVID-19 (12/09/2019), DEPRESSION, Diverticulitis, DYSKINESIA, ESOPHAGUS, Essential hypertension, FIBROMYALGIA, GERD (gastroesophageal reflux disease), Glaucoma, HYPERLIPIDEMIA, INSOMNIA, Myocardial infarction (HCC), O2 dependent, Paroxysmal atrial fibrillation (HCC), Peripheral neuropathy, Pneumonia (12/2016), Rash and other nonspecific skin eruption (04/12/2009), SLEEP APNEA, OBSTRUCTIVE, Syncope, and Type II diabetes mellitus (HCC).   Significant Hospital Events: Including procedures, antibiotic start and stop dates in addition to other pertinent events   4/24: Admit to ICU for  Septic Shock, LLE cellulitis 4/25: Off vasopressors.  Interim History / Subjective:  Remains off vasopressors.  No voiced complaints.  Creatinine has increased.  Objective   Blood pressure (!) 81/59, pulse 73, temperature 98.1 F (36.7 C), temperature source Oral, resp. rate 16, height 5\' 11"  (1.803 m), weight (!) 144.6 kg, SpO2 97%.        Intake/Output Summary (Last 24 hours) at 02/08/2024 1315 Last data filed at 02/08/2024 1000 Gross per 24 hour  Intake 1163.27 ml  Output 500 ml  Net 663.27 ml   Filed Weights   02/07/24 0500  Weight: (!) 144.6 kg    Examination: General:  chronically ill, obese, appearing male sitting in bed, no acute distress, HEENT: MM pink/moist, contracture of head to right, glasses Neuro: Awake, appropriate, no focal deficits. CV: Heart sounds are unremarkable.  Hands are cool. PULM: Chest clear. GI: Soft and nontender. Extremities: Bilateral venous stasis changes but left leg is warmer with streaking and more tender to palpation.  Ancillary test personally reviewed  Leukocytosis improving to 22.7. Blood cultures remain negative. Mild hyponatremia 129.  Creatinine is risen to 3.  1 9  Assessment & Plan:   Septic shock 2/2 to LLE cellulitis Hx relative adrenal insufficiency (chronic prednisone  2/2 shoulder arthritis per pt) AKI Hx NICM, -EF 40%, S/p BiV ICD/pacemaker HTN Atrial fibrillation on Eliquis  Hx COPD/ chronic hypoxic respiratory failure  OSA intolerant to CPAP Chronic pain prior cervical spine fx 05/2023/ contraction of neck Anxiety/ depression Hypoglycemia Chronic Debility  Plan:  - Sepsis appears to have resolved but creatinine continues to worsen.  This may be due to ATN lagging behind sepsis resolution versus persistent volume contraction. - Fluid challenge.  Repeat electrolytes later this  afternoon. - Continue to hold GDMT for now.  If blood pressure remains good can probably restart tomorrow - Continue current antibiotic  therapy. - Continue current bronchodilators. - Can resume Eliquis  at this time. - Plan to stop stress steroids tomorrow  Best Practice (right click and "Reselect all SmartList Selections" daily)   Diet/type: Regular consistency (see orders) DVT prophylaxis DOAC Pressure ulcer(s): N/A GI prophylaxis: n/a Lines: N/A Foley:  N/A Code Status:  full code  Pt updated on plan of care 4/25  CRITICAL CARE Performed by: Arlina Lair   Total critical care time: 35 minutes  Critical care time was exclusive of separately billable procedures and treating other patients.  Critical care was necessary to treat or prevent imminent or life-threatening deterioration.  Critical care was time spent personally by me on the following activities: development of treatment plan with patient and/or surrogate as well as nursing, discussions with consultants, evaluation of patient's response to treatment, examination of patient, obtaining history from patient or surrogate, ordering and performing treatments and interventions, ordering and review of laboratory studies, ordering and review of radiographic studies, pulse oximetry, re-evaluation of patient's condition and participation in multidisciplinary rounds.  Arlina Lair, MD [facility] ICU Physician [facility] [place]  Pager: [phone] Mobile: [phone] After hours: [phone].

## 2024-02-09 DIAGNOSIS — N179 Acute kidney failure, unspecified: Secondary | ICD-10-CM | POA: Diagnosis not present

## 2024-02-09 DIAGNOSIS — A419 Sepsis, unspecified organism: Secondary | ICD-10-CM | POA: Diagnosis not present

## 2024-02-09 DIAGNOSIS — I10 Essential (primary) hypertension: Secondary | ICD-10-CM | POA: Diagnosis not present

## 2024-02-09 DIAGNOSIS — R6521 Severe sepsis with septic shock: Secondary | ICD-10-CM | POA: Diagnosis not present

## 2024-02-09 LAB — CBC
HCT: 28.7 % — ABNORMAL LOW (ref 39.0–52.0)
Hemoglobin: 9.1 g/dL — ABNORMAL LOW (ref 13.0–17.0)
MCH: 29.9 pg (ref 26.0–34.0)
MCHC: 31.7 g/dL (ref 30.0–36.0)
MCV: 94.4 fL (ref 80.0–100.0)
Platelets: 260 10*3/uL (ref 150–400)
RBC: 3.04 MIL/uL — ABNORMAL LOW (ref 4.22–5.81)
RDW: 14.9 % (ref 11.5–15.5)
WBC: 15.3 10*3/uL — ABNORMAL HIGH (ref 4.0–10.5)
nRBC: 0 % (ref 0.0–0.2)

## 2024-02-09 LAB — BASIC METABOLIC PANEL WITH GFR
Anion gap: 14 (ref 5–15)
BUN: 64 mg/dL — ABNORMAL HIGH (ref 8–23)
CO2: 23 mmol/L (ref 22–32)
Calcium: 8.4 mg/dL — ABNORMAL LOW (ref 8.9–10.3)
Chloride: 94 mmol/L — ABNORMAL LOW (ref 98–111)
Creatinine, Ser: 2.27 mg/dL — ABNORMAL HIGH (ref 0.61–1.24)
GFR, Estimated: 30 mL/min — ABNORMAL LOW (ref >60)
Glucose, Bld: 97 mg/dL (ref 70–99)
Potassium: 4.1 mmol/L (ref 3.5–5.1)
Sodium: 131 mmol/L — ABNORMAL LOW (ref 135–145)

## 2024-02-09 LAB — POCT I-STAT 7, (LYTES, BLD GAS, ICA,H+H)
Acid-Base Excess: 0 mmol/L (ref 0.0–2.0)
Bicarbonate: 25.6 mmol/L (ref 20.0–28.0)
Calcium, Ion: 1.21 mmol/L (ref 1.15–1.40)
HCT: 30 % — ABNORMAL LOW (ref 39.0–52.0)
Hemoglobin: 10.2 g/dL — ABNORMAL LOW (ref 13.0–17.0)
O2 Saturation: 91 %
Patient temperature: 98.6
Potassium: 4.4 mmol/L (ref 3.5–5.1)
Sodium: 130 mmol/L — ABNORMAL LOW (ref 135–145)
TCO2: 27 mmol/L (ref 22–32)
pCO2 arterial: 47.3 mmHg (ref 32–48)
pH, Arterial: 7.342 — ABNORMAL LOW (ref 7.35–7.45)
pO2, Arterial: 66 mmHg — ABNORMAL LOW (ref 83–108)

## 2024-02-09 LAB — GLUCOSE, CAPILLARY
Glucose-Capillary: 88 mg/dL (ref 70–99)
Glucose-Capillary: 88 mg/dL (ref 70–99)
Glucose-Capillary: 141 mg/dL — ABNORMAL HIGH (ref 70–99)
Glucose-Capillary: 135 mg/dL — ABNORMAL HIGH (ref 70–99)
Glucose-Capillary: 111 mg/dL — ABNORMAL HIGH (ref 70–99)

## 2024-02-09 LAB — VANCOMYCIN, RANDOM: Vancomycin Rm: 20 ug/mL

## 2024-02-09 MED ORDER — VANCOMYCIN HCL IN DEXTROSE 1-5 GM/200ML-% IV SOLN
1000.0000 mg | Freq: Once | INTRAVENOUS | Status: AC
  Administered 2024-02-09: 1000 mg via INTRAVENOUS
  Filled 2024-02-09: qty 200

## 2024-02-09 MED ORDER — PREDNISONE 10 MG PO TABS
10.0000 mg | ORAL_TABLET | Freq: Every day | ORAL | Status: DC
  Administered 2024-02-10 – 2024-02-13 (×4): 10 mg via ORAL
  Filled 2024-02-09 (×4): qty 1

## 2024-02-09 MED ORDER — CALCIUM CARBONATE ANTACID 500 MG PO CHEW
1.0000 | CHEWABLE_TABLET | ORAL | Status: DC | PRN
  Administered 2024-02-10: 200 mg via ORAL
  Filled 2024-02-09: qty 1

## 2024-02-09 MED ORDER — CARVEDILOL 3.125 MG PO TABS
3.1250 mg | ORAL_TABLET | Freq: Two times a day (BID) | ORAL | Status: DC
Start: 2024-02-09 — End: 2024-02-13
  Administered 2024-02-09 – 2024-02-13 (×8): 3.125 mg via ORAL
  Filled 2024-02-09 (×8): qty 1

## 2024-02-09 NOTE — Progress Notes (Signed)
 NAME:  [name], MRN:  [id number], DOB:  [birth year], LOS: 3 ADMISSION DATE:  02/06/2024, CONSULTATION DATE:  02/06/2024 REFERRING MD:  Paris Bolds, MD, CHIEF COMPLAINT:  Weakness   History of Present Illness:  68 y/o male with PMH for COPD now on 2 liters home O2 (started from last admission 2 weeks ago), OSA-not tolerant to PAP, Cervical spine fracture, Cardiomyopathy EF 40%,  s/p BiV ICD, Atrial Fibrillation on Eliquis , Peripheral Neuropathy and multiple other medical issues who was just d/c from [facility] ICU about 2 weeks ago and he was admitted at that time for Septic shock for viral gastro-enteritis with diarrhea, Syncopal episode and had AKI, requiring vasopressors.  He was d/c home and says he was able to walk with a walker and his girlfriend was helping to take care of him. He now returns with generalized weakness starting this morning.  He was not able to get up from a chair.   During his ED stay he became hypotensive requiring IV Vasopressors.  He was noted to have an AKI with Cr 1.89 from 0.99 3 days before.  His WBC was 20.8, Trop 21, (BNP 56 on 4/21). LLE noted to be warm/red and painful.  Pertinent  Medical History  AICD (automatic cardioverter/defibrillator) present, Anemia, ANXIETY, Arthritis, Asthma, Cardiomyopathy (HCC), Chronic constipation, COPD (chronic obstructive pulmonary disease) (HCC), COVID-19 (12/09/2019), DEPRESSION, Diverticulitis, DYSKINESIA, ESOPHAGUS, Essential hypertension, FIBROMYALGIA, GERD (gastroesophageal reflux disease), Glaucoma, HYPERLIPIDEMIA, INSOMNIA, Myocardial infarction (HCC), O2 dependent, Paroxysmal atrial fibrillation (HCC), Peripheral neuropathy, Pneumonia (12/2016), Rash and other nonspecific skin eruption (04/12/2009), SLEEP APNEA, OBSTRUCTIVE, Syncope, and Type II diabetes mellitus (HCC).   Significant Hospital Events: Including procedures, antibiotic start and stop dates in addition to other pertinent events   4/24: Admit to ICU for  Septic Shock, LLE cellulitis 4/25: Off vasopressors. 4/27: creatinine improving.   Interim History / Subjective:  Remains off vasopressors.  Appetite is improving. Less leg pain.   Objective   Blood pressure (!) 81/59, pulse 69, temperature 98.6 F (37 C), temperature source Oral, resp. rate (!) 24, height 5\' 11"  (1.803 m), weight (!) 144.6 kg, SpO2 97%.        Intake/Output Summary (Last 24 hours) at 02/09/2024 1254 Last data filed at 02/09/2024 0900 Gross per 24 hour  Intake 874.44 ml  Output 1500 ml  Net -625.56 ml   Filed Weights   02/07/24 0500  Weight: (!) 144.6 kg    Examination: General:  chronically ill, obese, appearing male sitting in bed, no acute distress, HEENT: MM pink/moist, contracture of head to right, glasses Neuro: Awake, appropriate, no focal deficits. CV: Heart sounds are unremarkable.   PULM: Chest clear. GI: Soft and nontender. Extremities: Bilateral venous stasis changes. Differential redness and warmth of left leg less noticeable. Bilateral weeping lower legs.   Ancillary test personally reviewed  Leukocytosis improving to 15.3 Blood cultures remain negative. Improving hyponatremia 131.  Creatinine improving to 2.27  Assessment & Plan:   Septic shock 2/2 to LLE cellulitis - resolved.  Hx relative adrenal insufficiency (chronic prednisone  2/2 shoulder arthritis per pt) AKI - improving Hx NICM, -EF 40%, S/p BiV ICD/pacemaker HTN Atrial fibrillation on Eliquis  Hx COPD/ chronic hypoxic respiratory failure  OSA intolerant to CPAP Chronic pain prior cervical spine fx 05/2023/ contraction of neck Anxiety/ depression Hypoglycemia Chronic Debility  Plan:  - Off pressors and creatinine improving.  - Restart GDMT with carvedilol . Hold losartan  and diuretics for now as creatinine starting to recover and sodium coming up. May  be ready to restart torsemide  tomorrow if sodium has normalized and creatinine continues to improve. - Unna boot application  may promote healing.  - Continue current antibiotic therapy for 7 days/ - Continue current bronchodilators. - Resumed Eliquis  at this time. - Ready for transfer  Best Practice (right click and "Reselect all SmartList Selections" daily)   Diet/type: Regular consistency (see orders) DVT prophylaxis DOAC Pressure ulcer(s): N/A GI prophylaxis: n/a Lines: N/A Foley:  N/A Code Status:  full code  Pt updated on plan of care 4/25  Arlina Lair, MD [facility] ICU Physician [facility] [place] Critical [facility]  Pager: [phone] Mobile: [phone] After hours: [phone].

## 2024-02-09 NOTE — Progress Notes (Signed)
 Pharmacy Antibiotic Note  KAININ HOSP is a 68 y.o. male admitted on 02/06/2024 with cellulitis.  Pharmacy has been consulted for vancomycin , cefepime  dosing.  Scr up to 3.19 and now down to 2.27 (0.99 on 4/21).  Urine output has picked up (0.1 mL/kg/hr 4/25 >> 0.5 mL/kg/hr 4/26) without diuresis. Vasopressor therapy is now off.   Received 2 doses of vancomycin  during this admission (last 1000 mg ~ 2100 on 4/26).  Vancomycin  random level = 20 (~ 33 hr level).  Ke = 0.016, t 1/2 = 43.3 hr. However, this patient's renal function is dynamic and these numbers are changing (for the better currently).   Plan: Cefepime  2g IV q12h Vancomycin  1 g x1  -Continue vancomycin  variable dose per unstable renal function   -Random level tomorrow AM given improving renal function, otherwise I would time the random level based on Ke / t 1/2 vs order a scheduled dosing regimen  F/u renal function and culture data as able  F/u LOT  Height: 5\' 11"  (180.3 cm) Weight: (!) 144.6 kg (318 lb 12.6 oz) IBW/kg (Calculated) : 75.3  Temp (24hrs), Avg:98 F (36.7 C), Min:97.6 F (36.4 C), Max:98.4 F (36.9 C)  Recent Labs  Lab 02/03/24 1615 02/06/24 1320 02/06/24 2043 02/07/24 0235 02/08/24 0430 02/08/24 1723 02/09/24 0500  WBC 13.2* 20.8*  --  28.6* 22.7*  --  15.3*  CREATININE 0.99 1.89*  --  2.77* 3.19* 2.93* 2.27*  LATICACIDVEN  --   --  2.5* 1.5  --   --   --   VANCORANDOM  --   --   --   --   --   --  20    Estimated Creatinine Clearance: 44.7 mL/min (A) (by C-G formula based on SCr of 2.27 mg/dL (H)).    Allergies  Allergen Reactions   Tape Other (See Comments)    SKIN TEARS VERY, VERY EASILY!!!! Use Paper Tape Only, PLEASE!!!   Amiodarone  Other (See Comments)    Caused hyperthyroidism    Statins Other (See Comments)    Myalgias    Sulfa  Antibiotics Other (See Comments)    Mouth ulcers   Wound Dressing Adhesive Other (See Comments)    Skin Tears Use Paper Tape Only    Antimicrobials  this admission: Vanc 4/24> Cefepime  4/24>  Microbiology results: 4/24 MRSA Neg  4/24 BCX   Thank you for allowing pharmacy to be a part of this patient's care.  Patience Bonito, PharmD, BCPS, BCCCP Clinical Pharmacist

## 2024-02-09 NOTE — Plan of Care (Signed)

## 2024-02-09 NOTE — Evaluation (Signed)
 Occupational Therapy Evaluation Patient Details Name: [name] MRN: [id number] DOB: 12-31-1955 Today's Date: 02/09/2024   History of Present Illness   68 year old male admitted 02/07/24 with weakness, AKI, hypotension, LLE cellulitis. PMHx recent admit for sepsis 4/11-4/14 due to viral gastroenteritis. COPD on 2L O2, asthma overlap syndrome, OSA, T2DM, HTN, A-fib, cardiomyopathy EF 40%, s/p BiV ICD, torticollis, cervical fx, neuropathy     Clinical Impressions Pt reports using RW for mobility, ind with ADL but reports his significant other assists with IADLs. Pt currently needing min-max A for ADLs, and max +2 for bed mobility to sit EOB. Pt sits EOB x10 min for bed linen change and UB bathing task. Pt presenting with impairments listed below, will follow acutely. Patient will benefit from continued inpatient follow up therapy, <3 hours/day to maximize safety/ind with ADL/functional mobility.      If plan is discharge home, recommend the following:   A lot of help with walking and/or transfers;Two people to help with bathing/dressing/bathroom;Assistance with cooking/housework;Direct supervision/assist for medications management;Direct supervision/assist for financial management;Assist for transportation;Help with stairs or ramp for entrance     Functional Status Assessment         Equipment Recommendations   Wheelchair (measurements OT);Wheelchair cushion (measurements OT);Hospital bed     Recommendations for Other Services   PT consult     Precautions/Restrictions   Precautions Precautions: Fall;Other (comment) Recall of Precautions/Restrictions: Impaired Precaution/Restrictions Comments: monitor BP and O2, L fem line Restrictions Weight Bearing Restrictions Per Provider Order: No     Mobility Bed Mobility Overal bed mobility: Needs Assistance Bed Mobility: Supine to Sit, Sit to Supine     Supine to sit: Max assist, +2 for physical assistance           Transfers                   General transfer comment: unable from EOB      Balance Overall balance assessment: Needs assistance Sitting-balance support: Feet supported, Bilateral upper extremity supported Sitting balance-Leahy Scale: Poor Sitting balance - Comments: unilateral UE support at EOB                                   ADL either performed or assessed with clinical judgement   ADL Overall ADL's : Needs assistance/impaired Eating/Feeding: Minimal assistance;Bed level   Grooming: Minimal assistance;Bed level   Upper Body Bathing: Moderate assistance;Sitting   Lower Body Bathing: Maximal assistance;Bed level   Upper Body Dressing : Moderate assistance   Lower Body Dressing: Maximal assistance   Toilet Transfer: Maximal assistance;+2 for physical assistance           Functional mobility during ADLs: Maximal assistance;+2 for physical assistance       Vision Baseline Vision/History: 1 Wears glasses Vision Assessment?: No apparent visual deficits     Perception Perception: Not tested       Praxis Praxis: Not tested       Pertinent Vitals/Pain Pain Assessment Pain Assessment: Faces Pain Score: 4  Faces Pain Scale: Hurts little more Pain Location: BLE Pain Descriptors / Indicators: Aching, Sore, Grimacing Pain Intervention(s): Limited activity within patient's tolerance, Monitored during session, Repositioned     Extremity/Trunk Assessment Upper Extremity Assessment Upper Extremity Assessment: Generalized weakness   Lower Extremity Assessment Lower Extremity Assessment: Defer to PT evaluation   Cervical / Trunk Assessment Cervical / Trunk Assessment: Kyphotic;Other exceptions (R lateral neck flexion) Cervical /  Trunk Exceptions: increased body habitus, torticollis   Communication Communication Communication: No apparent difficulties   Cognition Arousal: Alert Behavior During Therapy: Flat affect Cognition: Cognition  impaired             OT - Cognition Comments: at times needing min cues and redirection to thoroughly answer questions during sessino                 Following commands: Impaired Following commands impaired: Follows one step commands with increased time, Follows one step commands inconsistently     Cueing  General Comments   Cueing Techniques: Verbal cues;Gestural cues;Tactile cues  VSS, RN present   Exercises     Shoulder Instructions      Home Living Family/patient expects to be discharged to:: Private residence Living Arrangements: Spouse/significant other Available Help at Discharge: Family;Available 24 hours/day Type of Home: House Home Access: Stairs to enter [employer] of Steps: 1 Entrance Stairs-Rails: Right Home Layout: One level     Bathroom Shower/Tub: Producer, television/film/video: Standard     Home Equipment: Pharmacist, hospital (2 wheels);Cane - single point;Grab bars - tub/shower;Hand held shower head   Additional Comments: Pt reports he typically sleeps in his recliner.      Prior Functioning/Environment Prior Level of Function : Independent/Modified Independent;History of Falls (last six months);Needs assist             Mobility Comments: was using RW PTA, reports falls PTA ADLs Comments: Independent to Mod I with ADLs. Girlfriend assists with cooking, other home management tasks as needed, and transportation. Pt is a Surveyor, minerals and retired Education administrator.    OT Problem List:     OT Treatment/Interventions:        OT Goals(Current goals can be found in the care plan section)   Acute Rehab OT Goals Patient Stated Goal: none stated OT Goal Formulation: With patient Time For Goal Achievement: 02/23/24 Potential to Achieve Goals: Good ADL Goals Pt Will Perform Upper Body Dressing: with min assist;sitting Pt Will Perform Lower Body Dressing: with mod assist;sitting/lateral leans;sit to/from stand Pt Will Transfer  to Toilet: with min assist;squat pivot transfer;stand pivot transfer;bedside commode   OT Frequency:  Min 2X/week    Co-evaluation              AM-PAC OT "6 Clicks" Daily Activity     Outcome Measure Help from another person eating meals?: A Little Help from another person taking care of personal grooming?: A Little Help from another person toileting, which includes using toliet, bedpan, or urinal?: A Lot Help from another person bathing (including washing, rinsing, drying)?: A Lot Help from another person to put on and taking off regular upper body clothing?: A Lot Help from another person to put on and taking off regular lower body clothing?: A Lot 6 Click Score: 14   End of Session Equipment Utilized During Treatment: Oxygen  (2L) Nurse Communication: Mobility status  Activity Tolerance: Patient tolerated treatment well Patient left: in bed;with call bell/phone within reach;with bed alarm set;with nursing/sitter in room  OT Visit Diagnosis: Unsteadiness on feet (R26.81);Other abnormalities of gait and mobility (R26.89);Muscle weakness (generalized) (M62.81);Other (comment)                Time: 1610-9604 OT Time Calculation (min): 33 min Charges:  OT General Charges $OT Visit: 1 Visit OT Evaluation $OT Eval Moderate Complexity: 1 Mod OT Treatments $Self Care/Home Management : 8-22 mins  Key Cen K, OTD, OTR/L SecureChat Preferred Acute Rehab (  [phone]   Antionette Kirks 02/09/2024, 3:13 PM

## 2024-02-10 ENCOUNTER — Encounter (HOSPITAL_COMMUNITY)

## 2024-02-10 DIAGNOSIS — A419 Sepsis, unspecified organism: Secondary | ICD-10-CM | POA: Diagnosis not present

## 2024-02-10 DIAGNOSIS — L02416 Cutaneous abscess of left lower limb: Secondary | ICD-10-CM | POA: Diagnosis not present

## 2024-02-10 DIAGNOSIS — L03116 Cellulitis of left lower limb: Secondary | ICD-10-CM | POA: Diagnosis not present

## 2024-02-10 DIAGNOSIS — R6521 Severe sepsis with septic shock: Secondary | ICD-10-CM | POA: Diagnosis not present

## 2024-02-10 LAB — GLUCOSE, CAPILLARY
Glucose-Capillary: 75 mg/dL (ref 70–99)
Glucose-Capillary: 111 mg/dL — ABNORMAL HIGH (ref 70–99)
Glucose-Capillary: 122 mg/dL — ABNORMAL HIGH (ref 70–99)
Glucose-Capillary: 100 mg/dL — ABNORMAL HIGH (ref 70–99)
Glucose-Capillary: 108 mg/dL — ABNORMAL HIGH (ref 70–99)

## 2024-02-10 LAB — CBC
HCT: 29.7 % — ABNORMAL LOW (ref 39.0–52.0)
Hemoglobin: 9.3 g/dL — ABNORMAL LOW (ref 13.0–17.0)
MCH: 29.8 pg (ref 26.0–34.0)
MCHC: 31.3 g/dL (ref 30.0–36.0)
MCV: 95.2 fL (ref 80.0–100.0)
Platelets: 274 10*3/uL (ref 150–400)
RBC: 3.12 MIL/uL — ABNORMAL LOW (ref 4.22–5.81)
RDW: 15 % (ref 11.5–15.5)
WBC: 11.7 10*3/uL — ABNORMAL HIGH (ref 4.0–10.5)
nRBC: 0.3 % — ABNORMAL HIGH (ref 0.0–0.2)

## 2024-02-10 LAB — BASIC METABOLIC PANEL WITH GFR
Anion gap: 9 (ref 5–15)
BUN: 56 mg/dL — ABNORMAL HIGH (ref 8–23)
CO2: 24 mmol/L (ref 22–32)
Calcium: 9 mg/dL (ref 8.9–10.3)
Chloride: 102 mmol/L (ref 98–111)
Creatinine, Ser: 1.6 mg/dL — ABNORMAL HIGH (ref 0.61–1.24)
GFR, Estimated: 46 mL/min — ABNORMAL LOW (ref >60)
Glucose, Bld: 91 mg/dL (ref 70–99)
Potassium: 4.7 mmol/L (ref 3.5–5.1)
Sodium: 135 mmol/L (ref 135–145)

## 2024-02-10 LAB — VANCOMYCIN, RANDOM: Vancomycin Rm: 20 ug/mL

## 2024-02-10 MED ORDER — CEFAZOLIN SODIUM-DEXTROSE 2-4 GM/100ML-% IV SOLN
2.0000 g | Freq: Three times a day (TID) | INTRAVENOUS | Status: AC
  Administered 2024-02-10 – 2024-02-12 (×9): 2 g via INTRAVENOUS
  Filled 2024-02-10 (×9): qty 100

## 2024-02-10 MED ORDER — ACETAMINOPHEN 500 MG PO TABS
1000.0000 mg | ORAL_TABLET | Freq: Four times a day (QID) | ORAL | Status: DC
  Administered 2024-02-10 – 2024-02-13 (×12): 1000 mg via ORAL
  Filled 2024-02-10 (×12): qty 2

## 2024-02-10 MED ORDER — TRAMADOL HCL 50 MG PO TABS
50.0000 mg | ORAL_TABLET | Freq: Two times a day (BID) | ORAL | Status: DC | PRN
  Administered 2024-02-10: 50 mg via ORAL
  Filled 2024-02-10: qty 1

## 2024-02-10 MED ORDER — NAPHAZOLINE-GLYCERIN 0.012-0.25 % OP SOLN
1.0000 [drp] | Freq: Four times a day (QID) | OPHTHALMIC | Status: DC | PRN
  Administered 2024-02-10 – 2024-02-13 (×4): 1 [drp] via OPHTHALMIC
  Filled 2024-02-10 (×2): qty 15

## 2024-02-10 MED ORDER — GABAPENTIN 100 MG PO CAPS
100.0000 mg | ORAL_CAPSULE | Freq: Three times a day (TID) | ORAL | Status: DC
  Administered 2024-02-10 – 2024-02-13 (×10): 100 mg via ORAL
  Filled 2024-02-10 (×10): qty 1

## 2024-02-10 MED ORDER — CARISOPRODOL 350 MG PO TABS
350.0000 mg | ORAL_TABLET | Freq: Three times a day (TID) | ORAL | Status: DC
Start: 2024-02-10 — End: 2024-02-13
  Administered 2024-02-10 – 2024-02-13 (×10): 350 mg via ORAL
  Filled 2024-02-10 (×10): qty 1

## 2024-02-10 MED ORDER — TRAMADOL HCL 50 MG PO TABS
50.0000 mg | ORAL_TABLET | Freq: Three times a day (TID) | ORAL | Status: DC | PRN
  Administered 2024-02-10 – 2024-02-13 (×6): 50 mg via ORAL
  Filled 2024-02-10 (×6): qty 1

## 2024-02-10 NOTE — Plan of Care (Signed)
  Problem: Skin Integrity: Goal: Skin integrity will improve Outcome: Progressing   Problem: Pain Managment: Goal: General experience of comfort will improve and/or be controlled Outcome: Progressing   Problem: Coping: Goal: Level of anxiety will decrease Outcome: Progressing   Problem: Nutrition: Goal: Adequate nutrition will be maintained Outcome: Progressing   Problem: Clinical Measurements: Goal: Diagnostic test results will improve Outcome: Progressing   Problem: Clinical Measurements: Goal: Will remain free from infection Outcome: Progressing Goal: Diagnostic test results will improve Outcome: Progressing   Problem: Education: Goal: Knowledge of General Education information will improve Description: Including pain rating scale, medication(s)/side effects and non-pharmacologic comfort measures Outcome: Progressing

## 2024-02-10 NOTE — NC FL2 (Signed)
 [place]  MEDICAID FL2 LEVEL OF CARE FORM     IDENTIFICATION  Patient Name: [name] Birthdate: [birth year] Sex: male Admission Date (Current Location): 02/06/2024  [facility] and [place] Number:  Producer, television/film/video and Address:  [facility], [address] [place], [place] [address]      Provider Number: [id number]  Attending Physician Name and Address:  Theadore Finger, MD  Relative Name and Phone Number:       Current Level of Care: Hospital Recommended Level of Care: Skilled Nursing Facility Prior Approval Number:    Date Approved/Denied:   PASRR Number: [id number]  Discharge Plan: SNF    Current Diagnoses: Patient Active Problem List   Diagnosis Date Noted   Hypotension 02/07/2024   Sepsis (HCC) 02/06/2024   Septic shock (HCC) 01/24/2024   Dysphagia 01/03/2024   Incontinence of feces 01/03/2024   Cervical dystonia 08/14/2023   C3 cervical fracture (HCC) 07/01/2023   Neck pain 06/13/2023   Leg wound, left 05/25/2023   Closed C7 fracture without spinal cord injury (HCC) 05/14/2023   Shingles 03/03/2023   Epigastric pain 01/16/2023   Nausea and vomiting 01/16/2023   Cardiac arrhythmia 01/16/2023   Cellulitis and abscess of leg, except foot 11/21/2022   Cellulitis of right lower extremity without foot 11/20/2022   Anxiety and depression 05/20/2022   AC (acromioclavicular) arthritis 04/24/2022   Cataract, nuclear sclerotic, left eye 03/07/2022   ED (erectile dysfunction) 01/03/2022   Intertrigo 12/24/2021   Gluteal tendinitis of left buttock 12/19/2021   Unilateral primary osteoarthritis, left knee 10/17/2021   Status post total knee replacement, left 10/17/2021   Rib pain on right side 10/10/2021   History of colonic polyps 09/26/2021   Bursitis of left hip 06/13/2021   Tooth fracture 06/11/2021   Pain and swelling of right lower leg 05/18/2021   Gastrointestinal hemorrhage    Diverticulitis of colon    Preop exam for  internal medicine 03/04/2021   B12 deficiency 03/04/2021   Vitamin D  deficiency 03/01/2021   Myalgia 12/01/2020   Laceration of left leg 09/17/2020   Statin myopathy 08/02/2020   Rotator cuff arthropathy of both shoulders 02/23/2020   ICD (implantable cardioverter-defibrillator) in place 12/27/2019   Elevated PSA 09/23/2019   Lip lesion 12/23/2018   Diabetic ulcer of left lower leg with necrosis of muscle (HCC) 12/05/2018   Chronic bursitis of left shoulder 07/01/2018   Pain due to total knee replacement (HCC) 03/31/2018   Rotator cuff arthropathy, left 02/13/2018   Right rotator cuff tear arthropathy 01/14/2018   Increased prostate specific antigen (PSA) velocity 09/26/2017   HFrEF (heart failure with reduced ejection fraction) (HCC) 09/18/2017   Trigger point of thoracic region 07/04/2017   Right lumbar radiculopathy 05/15/2017   Right leg swelling 05/09/2017   Glaucoma 04/18/2017   History of nasal polyposis 02/19/2017   Chronic rhinitis 02/19/2017   Microhematuria 12/04/2016   Hyperlipidemia 08/15/2016   Encounter for well adult exam with abnormal findings 11/29/2015   Syncope 03/30/2015   Former tobacco use 02/25/2015   Type 2 diabetes mellitus (HCC) 02/25/2015   Permanent atrial fibrillation (HCC) 02/25/2015   Anemia 02/23/2015   Thyrotoxicosis 02/23/2015   Hx of adenomatous colonic polyps 10/21/2014   De Quervain's tenosynovitis, right 04/30/2014   Osteoarthritis of right knee 02/19/2014   Encounter for therapeutic drug monitoring 11/17/2013   Thoracic degenerative disc disease 06/17/2013   Thoracic spondylosis without myelopathy 02/11/2013   COPD (chronic obstructive pulmonary disease) (HCC)  08/20/2012   Bundle branch block, right and extreme left anterior fascicular 05/05/2012   Nonischemic cardiomyopathy (HCC) 05/02/2012   Cardiomyopathy (HCC)    Spongiotic dermatitis 09/26/2011   Urethral stricture 12/26/2010   Chronic pain syndrome 05/18/2010   OSA  (obstructive sleep apnea) 10/21/2007   NEPHROLITHIASIS, HX OF 10/21/2007   GERD (gastroesophageal reflux disease) 06/09/2007   Benign prostatic hyperplasia 06/09/2007   Morbid obesity (HCC) 06/03/2007   Depression 06/03/2007   INSOMNIA 06/03/2007    Orientation RESPIRATION BLADDER Height & Weight     Self, Time, Situation, Place  O2 (2L O2 [place]) External catheter, Continent Weight: 284 lb 6.3 oz (129 kg) Height:  5\' 10"  (177.8 cm)  BEHAVIORAL SYMPTOMS/MOOD NEUROLOGICAL BOWEL NUTRITION STATUS      Continent Diet (see dc summary)  AMBULATORY STATUS COMMUNICATION OF NEEDS Skin   Extensive Assist Verbally Other (Comment) (Wound / Incision - Skin tear Arm Lower;Posterior;Right distal wound, Skin tear Arm Left;Posterior;Upper bleeding, Non-pressure wound Heel Left dry, scabbing, Irritant Dermatitis Pelvis Anterior split skin, redness)                       Personal Care Assistance Level of Assistance  Bathing, Feeding, Dressing Bathing Assistance: Maximum assistance Feeding assistance: Limited assistance Dressing Assistance: Maximum assistance     Functional Limitations Info  Sight, Hearing, Speech Sight Info: Impaired Financial trader) Hearing Info: Adequate Speech Info: Adequate    SPECIAL CARE FACTORS FREQUENCY  PT (By licensed PT), OT (By licensed OT)     PT Frequency: 5x week OT Frequency: 5x week            Contractures Contractures Info: Not present    Additional Factors Info  Code Status, Allergies, Isolation Precautions, Psychotropic Code Status Info: Full Allergies Info: Tape, Amiodarone , Statins, Sulfa  Antibiotics, Wound Dressing Adhesive Psychotropic Info: Seroquel   Isolation Precautions Info: MRSA     Current Medications (02/10/2024):  This is the current hospital active medication list Current Facility-Administered Medications  Medication Dose Route Frequency Provider Last Rate Last Admin   albuterol  (PROVENTIL ) (2.5 MG/3ML) 0.083% nebulizer solution  2.5 mg  2.5 mg Nebulization Q4H PRN Simpson, Paula B, NP       ALPRAZolam  (XANAX ) tablet 1 mg  1 mg Oral QHS PRN Alva, Rakesh V, MD   1 mg at 02/09/24 2130   apixaban  (ELIQUIS ) tablet 5 mg  5 mg Oral BID Alva, Rakesh V, MD   5 mg at 02/10/24 1019   arformoterol  (BROVANA ) nebulizer solution 15 mcg  15 mcg Nebulization BID Simpson, Paula B, NP   15 mcg at 02/10/24 1610   budesonide  (PULMICORT ) nebulizer solution 0.5 mg  0.5 mg Nebulization BID Simpson, Paula B, NP   0.5 mg at 02/10/24 9604   calcium  carbonate (TUMS - dosed in mg elemental calcium ) chewable tablet 200 mg of elemental calcium   1 tablet Oral Q4H PRN Agarwala, Ravi, MD       carisoprodol  (SOMA ) tablet 350 mg  350 mg Oral TID Gonfa, Taye T, MD   350 mg at 02/10/24 1021   carvedilol  (COREG ) tablet 3.125 mg  3.125 mg Oral BID WC Agarwala, Ravi, MD   3.125 mg at 02/10/24 1019   ceFAZolin  (ANCEF ) IVPB 2g/100 mL premix  2 g Intravenous Q8H Gonfa, Taye T, MD 200 mL/hr at 02/10/24 1024 2 g at 02/10/24 1024   Chlorhexidine  Gluconate Cloth 2 % PADS 6 each  6 each Topical Daily Claven Cumming, MD   6 each at  02/10/24 1022   DULoxetine  (CYMBALTA ) DR capsule 60 mg  60 mg Oral Daily Alva, Rakesh V, MD   60 mg at 02/10/24 1019   feeding supplement (ENSURE ENLIVE / ENSURE PLUS) liquid 237 mL  237 mL Oral BID BM Agarwala, Ravi, MD   237 mL at 02/10/24 1028   gabapentin  (NEURONTIN ) capsule 100 mg  100 mg Oral TID Gonfa, Taye T, MD   100 mg at 02/10/24 1020   leptospermum manuka honey (MEDIHONEY) paste 1 Application  1 Application Topical Daily Claven Cumming, MD   1 Application at 02/10/24 1028   ondansetron  (ZOFRAN ) injection 4 mg  4 mg Intravenous Q6H PRN Claven Cumming, MD   4 mg at 02/07/24 1452   Oral care mouth rinse  15 mL Mouth Rinse PRN Claven Cumming, MD       pantoprazole  (PROTONIX ) EC tablet 40 mg  40 mg Oral Daily Alva, Rakesh V, MD   40 mg at 02/10/24 1021   predniSONE  (DELTASONE ) tablet 10 mg  10 mg Oral Q breakfast Agarwala, Martha Slack, MD   10 mg at  02/10/24 1020   QUEtiapine (SEROQUEL) tablet 25 mg  25 mg Oral QHS Quillian Brunt, MD   25 mg at 02/09/24 2131   revefenacin  (YUPELRI ) nebulizer solution 175 mcg  175 mcg Nebulization Daily Simpson, Paula B, NP   175 mcg at 02/10/24 1610   tamsulosin  (FLOMAX ) capsule 0.4 mg  0.4 mg Oral Daily Alva, Rakesh V, MD   0.4 mg at 02/10/24 1021   traMADol  (ULTRAM ) tablet 50 mg  50 mg Oral Q12H PRN Gonfa, Taye T, MD   50 mg at 02/10/24 1021   venlafaxine  XR (EFFEXOR -XR) 24 hr capsule 150 mg  150 mg Oral Q breakfast Alva, Rakesh V, MD   150 mg at 02/10/24 1020     Discharge Medications: Please see discharge summary for a list of discharge medications.  Relevant Imaging Results:  Relevant Lab Results:   Additional Information SSN [id number]  Arron Big, LCSWA

## 2024-02-10 NOTE — Progress Notes (Signed)
 PROGRESS NOTE  [name] ZOX:[id number] DOB: 07-14-1956   PCP: Roslyn Coombe, MD  Patient is from: Home.  Lives with girlfriend.  DOA: 02/06/2024 LOS: 4  Chief complaints Chief Complaint  Patient presents with   Weakness     Brief Narrative / Interim history: 68 year old M with PMH of COPD, OSA not on CPAP, chronic hypoxic RF on 2 L, systolic CHF s/p BiV ICD, A-fib on Eliquis , morbid obesity, anxiety, depression, adrenal insufficiency due to chronic steroid and recent hospitalization due to septic shock in the setting of viral gastroenteritis, diarrhea, syncopal episode and AKI, returning with generalized weakness, and admitted to ICU due to septic shock in the setting of LLE cellulitis and AKI.  In ED, hypotensive.  Cr 1.9 (from 1.03 days prior).  WBC 20.8.  Troponin 21.  LLE noted to be warm, red and painful.  Cultures obtained.  Patient was started on broad-spectrum antibiotics and vasopressor and admitted to ICU.  Patient came off vasopressor on 4/25.  AKI improved.  Transferred out of ICU on 4/28. Antibiotics de-escalated to IV Ancef .  Therapy recommended SNF but patient prefers to go home when medically stable  Subjective: Seen and examined earlier this morning.  No major events overnight of this morning.  No complaints.  Had some shoulder pain earlier that has improved after pain medication.  Currently no complaints.  Denies chest pain or shortness of breath.  Denies leg pain.  Objective: Vitals:   02/10/24 0415 02/10/24 0416 02/10/24 0733 02/10/24 1019  BP:   134/67 134/67  Pulse:   72 72  Resp: (!) 24 (!) 22 18   Temp:   97.7 F (36.5 C)   TempSrc:   Oral   SpO2:   93%   Weight:      Height:        Examination:  GENERAL: No apparent distress.  Nontoxic. HEENT: MMM.  Vision and hearing grossly intact.  NECK: Supple.  No apparent JVD.  RESP:  No IWOB.  Fair aeration bilaterally but limited exam due to body habitus. CVS:  RRR. Heart sounds normal.  ABD/GI/GU:  BS+. Abd soft, NTND.  MSK/EXT:  Moves extremities. No apparent deformity. No edema.  SKIN: New Ace wrap over BLE. NEURO: Awake, alert and oriented appropriately.  No apparent focal neuro deficit. PSYCH: Calm. Normal affect.   Consultants:  Pulmonology admitted patient  Procedures: None  Microbiology summarized: MRSA PCR screen negative Blood cultures NGTD  Assessment and plan: Septic shock 2/2 to LLE cellulitis: Present on admission.  Sepsis physiology resolved.  Culture data as above.  MRI with severe soft tissue edema concerning for cellulitis and/or venous insufficiency but no osteomyelitis.  Blood cultures NGTD. - Cefepime  and Vanco 4/24>> IV Ancef  4/28>>> - Continue wound care  Hx relative adrenal insufficiency (chronic prednisone  2/2 shoulder arthritis per pt):  -Now stable on home prednisone .  AKI: Likely ATN in the setting of septic shock.  Also on ibuprofen , losartan  and torsemide  at home.  Improving. Recent Labs    01/25/24 2128 01/26/24 0627 01/27/24 0922 02/03/24 1615 02/06/24 1320 02/07/24 0235 02/08/24 0430 02/08/24 1723 02/09/24 0500 02/10/24 0241  BUN 44* 37* 26* 26* 31* 41* 60* 66* 64* 56*  CREATININE 1.68* 1.40* 0.86 0.99 1.89* 2.77* 3.19* 2.93* 2.27* 1.60*  - Continue monitoring - Continue holding nephrotoxins - Advised to avoid NSAID.  Chronic HFrEF/NICM S/p BiV ICD/pacemaker: TTE in 10/2023 with LVEF of 40 to 45%, indeterminate DD, normal PASP.  On torsemide  and losartan   at home.  Appears euvolemic on exam but difficult due to body habitus. - Continue holding torsemide   Paroxysmal atrial fibrillation on Eliquis : Rate controlled - Continue Coreg .  Started here. - On Eliquis  for anticoagulation - Optimize electrolytes  Chronic COPD/ chronic hypoxic respiratory failure: On 2 L - Continue home breathing treatments  OSA intolerant to CPAP - Continue home oxygen   Essential hypertension: Normotensive - Continue low-dose Coreg  - Continue holding  losartan  and torsemide  in the setting of AKI  Chronic pain prior cervical spine fx 05/2023/ contraction of neck - Resumed home tramadol , Soma  and gabapentin  at reduced dose - Discussed risk of polypharmacy  Anxiety/ depression/insomnia - Continue home meds.  Discussed risk of polypharmacy.  Chronic Debility: Limited mobility.  - Therapy recommended SNF but patient prefers to go home.  HH and DME ordered.  Polypharmacy: Takes about 6 tablets of Tylenol  PM at night in addition to Soma , gabapentin , tramadol , Ambien  and Xanax .  Extensive discussion about polypharmacy.  - Will continue home tramadol , Soma  and Ambien  at reduced dose. - Continue home gabapentin    Morbid obesity Body mass index is 40.81 kg/m.          DVT prophylaxis:   apixaban  (ELIQUIS ) tablet 5 mg  Code Status: Full code Family Communication: None at bedside Level of care: Telemetry Cardiac Status is: Inpatient Remains inpatient appropriate because: Septic shock due to LLE cellulitis, AKI   Final disposition: Home with home health and DME in the next 24 to 48 hours   55 minutes with more than 50% spent in reviewing records, counseling patient/family and coordinating care.   Sch Meds:  Scheduled Meds:  apixaban   5 mg Oral BID   arformoterol   15 mcg Nebulization BID   budesonide  (PULMICORT ) nebulizer solution  0.5 mg Nebulization BID   carisoprodol   350 mg Oral TID   carvedilol   3.125 mg Oral BID WC   Chlorhexidine  Gluconate Cloth  6 each Topical Daily   DULoxetine   60 mg Oral Daily   feeding supplement  237 mL Oral BID BM   gabapentin   100 mg Oral TID   leptospermum manuka honey  1 Application Topical Daily   pantoprazole   40 mg Oral Daily   predniSONE   10 mg Oral Q breakfast   QUEtiapine  25 mg Oral QHS   revefenacin   175 mcg Nebulization Daily   tamsulosin   0.4 mg Oral Daily   venlafaxine  XR  150 mg Oral Q breakfast   Continuous Infusions:   ceFAZolin  (ANCEF ) IV 2 g (02/10/24 1024)   PRN  Meds:.albuterol , ALPRAZolam , calcium  carbonate, ondansetron  (ZOFRAN ) IV, mouth rinse, traMADol   Antimicrobials: Anti-infectives (From admission, onward)    Start     Dose/Rate Route Frequency Ordered Stop   02/10/24 0845  ceFAZolin  (ANCEF ) IVPB 2g/100 mL premix        2 g 200 mL/hr over 30 Minutes Intravenous Every 8 hours 02/10/24 0753 02/13/24 0559   02/09/24 0815  vancomycin  (VANCOCIN ) IVPB 1000 mg/200 mL premix        1,000 mg 200 mL/hr over 60 Minutes Intravenous  Once 02/09/24 0721 02/09/24 1011   02/08/24 1228  vancomycin  variable dose per unstable renal function (pharmacist dosing)  Status:  Discontinued         Does not apply See admin instructions 02/08/24 1228 02/10/24 0738   02/07/24 2000  vancomycin  (VANCOCIN ) IVPB 1000 mg/200 mL premix  Status:  Discontinued        1,000 mg 200 mL/hr over 60 Minutes Intravenous  Every 24 hours 02/07/24 0915 02/08/24 1227   02/07/24 0800  ceFEPIme  (MAXIPIME ) 2 g in sodium chloride  0.9 % 100 mL IVPB  Status:  Discontinued        2 g 200 mL/hr over 30 Minutes Intravenous Every 12 hours 02/06/24 2116 02/10/24 0738   02/06/24 1945  ceFEPIme  (MAXIPIME ) 2 g in sodium chloride  0.9 % 100 mL IVPB        2 g 200 mL/hr over 30 Minutes Intravenous  Once 02/06/24 1936 02/06/24 2040   02/06/24 1945  vancomycin  (VANCOREADY) IVPB 2000 mg/400 mL        2,000 mg 200 mL/hr over 120 Minutes Intravenous  Once 02/06/24 1936 02/06/24 2257        I have personally reviewed the following labs and images: CBC: Recent Labs  Lab 02/06/24 1320 02/07/24 0235 02/08/24 0219 02/08/24 0430 02/09/24 0500 02/09/24 1423 02/10/24 0241  WBC 20.8* 28.6*  --  22.7* 15.3*  --  11.7*  NEUTROABS 20.0*  --   --   --   --   --   --   HGB 10.2* 11.0* 10.2* 9.6* 9.1* 10.2* 9.3*  HCT 36.3* 36.5* 30.0* 30.3* 28.7* 30.0* 29.7*  MCV 107.7* 100.8*  --  95.3 94.4  --  95.2  PLT 233 291  --  286 260  --  274   BMP &GFR Recent Labs  Lab 02/03/24 1615 02/06/24 1320  02/07/24 0235 02/08/24 0219 02/08/24 0430 02/08/24 1723 02/09/24 0500 02/09/24 1423 02/10/24 0241  NA 139   < > 135   < > 129* 129* 131* 130* 135  K 4.9   < > 4.8   < > 4.6 4.4 4.1 4.4 4.7  CL 103   < > 97*  --  93* 92* 94*  --  102  CO2 28   < > 24  --  22 23 23   --  24  GLUCOSE 105*   < > 66*  --  119* 114* 97  --  91  BUN 26*   < > 41*  --  60* 66* 64*  --  56*  CREATININE 0.99   < > 2.77*  --  3.19* 2.93* 2.27*  --  1.60*  CALCIUM  9.2   < > 8.3*  --  7.7* 8.1* 8.4*  --  9.0  MG 2.5*  --  1.5*  --  2.2  --   --   --   --   PHOS  --   --  6.2*  --   --   --   --   --   --    < > = values in this interval not displayed.   Estimated Creatinine Clearance: 58.8 mL/min (A) (by C-G formula based on SCr of 1.6 mg/dL (H)). Liver & Pancreas: Recent Labs  Lab 02/03/24 1615 02/06/24 1320  AST 21 25  ALT 31 28  ALKPHOS 58 67  BILITOT 0.6 0.8  PROT 6.1* 5.1*  ALBUMIN  3.4* 2.8*   No results for input(s): "LIPASE", "AMYLASE" in the last 168 hours. No results for input(s): "AMMONIA" in the last 168 hours. Diabetic: No results for input(s): "HGBA1C" in the last 72 hours. Recent Labs  Lab 02/09/24 1514 02/09/24 2104 02/10/24 0612 02/10/24 0915 02/10/24 1126  GLUCAP 135* 111* 75 111* 122*   Cardiac Enzymes: No results for input(s): "CKTOTAL", "CKMB", "CKMBINDEX", "TROPONINI" in the last 168 hours. No results for input(s): "PROBNP" in the last 8760 hours. Coagulation Profile:  No results for input(s): "INR", "PROTIME" in the last 168 hours. Thyroid  Function Tests: No results for input(s): "TSH", "T4TOTAL", "FREET4", "T3FREE", "THYROIDAB" in the last 72 hours. Lipid Profile: No results for input(s): "CHOL", "HDL", "LDLCALC", "TRIG", "CHOLHDL", "LDLDIRECT" in the last 72 hours. Anemia Panel: No results for input(s): "VITAMINB12", "FOLATE", "FERRITIN", "TIBC", "IRON", "RETICCTPCT" in the last 72 hours. Urine analysis:    Component Value Date/Time   COLORURINE AMBER (A)  02/08/2024 0430   APPEARANCEUR HAZY (A) 02/08/2024 0430   LABSPEC 1.024 02/08/2024 0430   PHURINE 5.0 02/08/2024 0430   GLUCOSEU NEGATIVE 02/08/2024 0430   GLUCOSEU NEGATIVE 01/07/2024 1546   HGBUR SMALL (A) 02/08/2024 0430   BILIRUBINUR NEGATIVE 02/08/2024 0430   KETONESUR 5 (A) 02/08/2024 0430   PROTEINUR 30 (A) 02/08/2024 0430   UROBILINOGEN 0.2 01/07/2024 1546   NITRITE NEGATIVE 02/08/2024 0430   LEUKOCYTESUR TRACE (A) 02/08/2024 0430   Sepsis Labs: Invalid input(s): "PROCALCITONIN", "LACTICIDVEN"  Microbiology: Recent Results (from the past 240 hours)  Blood culture (routine x 2)     Status: None (Preliminary result)   Collection Time: 02/06/24  8:05 PM   Specimen: BLOOD RIGHT FOREARM  Result Value Ref Range Status   Specimen Description BLOOD RIGHT FOREARM  Final   Special Requests   Final    BOTTLES DRAWN AEROBIC AND ANAEROBIC Blood Culture results may not be optimal due to an inadequate volume of blood received in culture bottles   Culture   Final    NO GROWTH 4 DAYS Performed at [facility], [address], [place], [place] [address]    Report Status PENDING  Incomplete  Blood culture (routine x 2)     Status: None (Preliminary result)   Collection Time: 02/06/24  8:15 PM   Specimen: BLOOD LEFT ARM  Result Value Ref Range Status   Specimen Description BLOOD LEFT ARM  Final   Special Requests   Final    BOTTLES DRAWN AEROBIC AND ANAEROBIC Blood Culture adequate volume   Culture   Final    NO GROWTH 4 DAYS Performed at [facility], [address], [place], [place] [address]    Report Status PENDING  Incomplete  MRSA Next Gen by PCR, Nasal     Status: None   Collection Time: 02/06/24 10:25 PM   Specimen: Nasal Mucosa; Nasal Swab  Result Value Ref Range Status   MRSA by PCR Next Gen NOT DETECTED NOT DETECTED Final    Comment: (NOTE) The GeneXpert MRSA Assay (FDA approved for NASAL specimens only), is one component of a comprehensive MRSA  colonization surveillance program. It is not intended to diagnose MRSA infection nor to guide or monitor treatment for MRSA infections. Test performance is not FDA approved in patients less than 78 years old. Performed at [facility], [address], [place], [place] [address]     Radiology Studies: No results found.    Makayah Pauli T. Kathalene Sporer [facility]  If 7PM-7AM, please contact night-coverage [email or web address] 02/10/2024, 11:35 AM

## 2024-02-10 NOTE — TOC Initial Note (Addendum)
 Transition of Care [facility]) - Initial/Assessment Note    Patient Details  Name: [name] MRN: [id number] Date of Birth: 1956-02-04  Transition of Care [facility]) CM/SW Contact:    Arron Big, LCSWA Phone Number: 02/10/2024, 11:32 AM  Clinical Narrative:   CSW met patient at bedside and introduced self/role. CSW discussed PT recs for SNF. At this time, patient is agreeable to SNF. CSW to complete SNF workup and provide bed offers.   11:44 AM SNF workup complete.   3:15 PM CSW provided patient with medicare.gov rated bed offers for SNF. Patient asked CSW to inform Amalia Badder, listed as caregiver in patients chart. CSW informed Amalia Badder of PT rec, she stated "Just make sure it is somewhere nice and close to home."   TOC will continue to follow.           Expected Discharge Plan: Skilled Nursing Facility Barriers to Discharge: Continued Medical Work up, SNF Pending bed offer   Patient Goals and CMS Choice Patient states their goals for this hospitalization and ongoing recovery are:: To get better          Expected Discharge Plan and Services In-house Referral: Clinical Social Work     Living arrangements for the past 2 months: Single Family Home                                      Prior Living Arrangements/Services Living arrangements for the past 2 months: Single Family Home Lives with:: Significant Other Patient language and need for interpreter reviewed:: Yes Do you feel safe going back to the place where you live?: Yes      Need for Family Participation in Patient Care: No (Comment) Care giver support system in place?: Yes (comment)   Criminal Activity/Legal Involvement Pertinent to Current Situation/Hospitalization: No - Comment as needed  Activities of Daily Living   ADL Screening (condition at time of admission) Independently performs ADLs?: No Does the patient have a NEW difficulty with bathing/dressing/toileting/self-feeding that is expected to last >3  days?: Yes (Initiates electronic notice to provider for possible OT consult) Does the patient have a NEW difficulty with getting in/out of bed, walking, or climbing stairs that is expected to last >3 days?: Yes (Initiates electronic notice to provider for possible PT consult) Does the patient have a NEW difficulty with communication that is expected to last >3 days?: No Is the patient deaf or have difficulty hearing?: No Does the patient have difficulty seeing, even when wearing glasses/contacts?: No Does the patient have difficulty concentrating, remembering, or making decisions?: No  Permission Sought/Granted Permission sought to share information with : Family Supports, Oceanographer granted to share information with : Yes, Verbal Permission Granted  Share Information with NAME: Earnesteen Gobble  Permission granted to share info w AGENCY: SNFs  Permission granted to share info w Relationship: Caregiver/girlfriend, son  Permission granted to share info w Contact Information: [phone], [phone]  Emotional Assessment Appearance:: Appears stated age Attitude/Demeanor/Rapport: Engaged Affect (typically observed): Pleasant Orientation: : Oriented to Self, Oriented to Place, Oriented to  Time, Oriented to Situation Alcohol / Substance Use: Not Applicable Psych Involvement: No (comment)  Admission diagnosis:  Generalized weakness [R53.1] Sepsis (HCC) [A41.9] Wound of left lower extremity, subsequent encounter [S81.802D] Hypotension, unspecified hypotension type [I95.9] Fatigue, unspecified type [R53.83] Patient Active Problem List   Diagnosis Date Noted   Hypotension 02/07/2024   Sepsis (HCC)  02/06/2024   Septic shock (HCC) 01/24/2024   Dysphagia 01/03/2024   Incontinence of feces 01/03/2024   Cervical dystonia 08/14/2023   C3 cervical fracture (HCC) 07/01/2023   Neck pain 06/13/2023   Leg wound, left 05/25/2023   Closed C7 fracture without spinal  cord injury (HCC) 05/14/2023   Shingles 03/03/2023   Epigastric pain 01/16/2023   Nausea and vomiting 01/16/2023   Cardiac arrhythmia 01/16/2023   Cellulitis and abscess of leg, except foot 11/21/2022   Cellulitis of right lower extremity without foot 11/20/2022   Anxiety and depression 05/20/2022   AC (acromioclavicular) arthritis 04/24/2022   Cataract, nuclear sclerotic, left eye 03/07/2022   ED (erectile dysfunction) 01/03/2022   Intertrigo 12/24/2021   Gluteal tendinitis of left buttock 12/19/2021   Unilateral primary osteoarthritis, left knee 10/17/2021   Status post total knee replacement, left 10/17/2021   Rib pain on right side 10/10/2021   History of colonic polyps 09/26/2021   Bursitis of left hip 06/13/2021   Tooth fracture 06/11/2021   Pain and swelling of right lower leg 05/18/2021   Gastrointestinal hemorrhage    Diverticulitis of colon    Preop exam for internal medicine 03/04/2021   B12 deficiency 03/04/2021   Vitamin D  deficiency 03/01/2021   Myalgia 12/01/2020   Laceration of left leg 09/17/2020   Statin myopathy 08/02/2020   Rotator cuff arthropathy of both shoulders 02/23/2020   ICD (implantable cardioverter-defibrillator) in place 12/27/2019   Elevated PSA 09/23/2019   Lip lesion 12/23/2018   Diabetic ulcer of left lower leg with necrosis of muscle (HCC) 12/05/2018   Chronic bursitis of left shoulder 07/01/2018   Pain due to total knee replacement (HCC) 03/31/2018   Rotator cuff arthropathy, left 02/13/2018   Right rotator cuff tear arthropathy 01/14/2018   Increased prostate specific antigen (PSA) velocity 09/26/2017   HFrEF (heart failure with reduced ejection fraction) (HCC) 09/18/2017   Trigger point of thoracic region 07/04/2017   Right lumbar radiculopathy 05/15/2017   Right leg swelling 05/09/2017   Glaucoma 04/18/2017   History of nasal polyposis 02/19/2017   Chronic rhinitis 02/19/2017   Microhematuria 12/04/2016   Hyperlipidemia 08/15/2016    Encounter for well adult exam with abnormal findings 11/29/2015   Syncope 03/30/2015   Former tobacco use 02/25/2015   Type 2 diabetes mellitus (HCC) 02/25/2015   Permanent atrial fibrillation (HCC) 02/25/2015   Anemia 02/23/2015   Thyrotoxicosis 02/23/2015   Hx of adenomatous colonic polyps 10/21/2014   De Quervain's tenosynovitis, right 04/30/2014   Osteoarthritis of right knee 02/19/2014   Encounter for therapeutic drug monitoring 11/17/2013   Thoracic degenerative disc disease 06/17/2013   Thoracic spondylosis without myelopathy 02/11/2013   COPD (chronic obstructive pulmonary disease) (HCC) 08/20/2012   Bundle branch block, right and extreme left anterior fascicular 05/05/2012   Nonischemic cardiomyopathy (HCC) 05/02/2012   Cardiomyopathy (HCC)    Spongiotic dermatitis 09/26/2011   Urethral stricture 12/26/2010   Chronic pain syndrome 05/18/2010   OSA (obstructive sleep apnea) 10/21/2007   NEPHROLITHIASIS, HX OF 10/21/2007   GERD (gastroesophageal reflux disease) 06/09/2007   Benign prostatic hyperplasia 06/09/2007   Morbid obesity (HCC) 06/03/2007   Depression 06/03/2007   INSOMNIA 06/03/2007   PCP:  Roslyn Coombe, MD Pharmacy:   [id number] - [place], [place] - [address] [place] [address] Phone: [phone] Fax: [phone]     Social Drivers of Health (SDOH) Social History: SDOH Screenings   Food Insecurity: No Food Insecurity (02/09/2024)  Housing: High Risk (  02/09/2024)  Transportation Needs: No Transportation Needs (02/09/2024)  Utilities: Not At Risk (02/09/2024)  Alcohol Screen: Low Risk  (09/16/2023)  Depression (PHQ2-9): Low Risk  (01/28/2024)  Financial Resource Strain: Medium Risk (01/01/2024)  Physical Activity: Inactive (01/01/2024)  Social Connections: Socially Isolated (02/09/2024)  Stress: No Stress Concern Present (01/01/2024)  Tobacco Use: Medium Risk (02/06/2024)   SDOH Interventions: Transportation Interventions:  Intervention Not Indicated   Readmission Risk Interventions    02/07/2024    2:21 PM 11/22/2022    1:34 PM  Readmission Risk Prevention Plan  Post Dischage Appt  Complete  Medication Screening  Complete  Transportation Screening Complete Complete  PCP or Specialist Appt within 3-5 Days Complete   HRI or Home Care Consult Complete   Social Work Consult for Recovery Care Planning/Counseling Complete   Palliative Care Screening Not Applicable   Medication Review Oceanographer) Referral to Pharmacy

## 2024-02-10 NOTE — Plan of Care (Signed)
  Problem: Education: Goal: Ability to describe self-care measures that may prevent or decrease complications (Diabetes Survival Skills Education) will improve Outcome: Progressing   Problem: Education: Goal: Individualized Educational Video(s) Outcome: Progressing   Problem: Coping: Goal: Ability to adjust to condition or change in health will improve Outcome: Progressing   Problem: Fluid Volume: Goal: Ability to maintain a balanced intake and output will improve Outcome: Progressing   Problem: Health Behavior/Discharge Planning: Goal: Ability to identify and utilize available resources and services will improve Outcome: Progressing

## 2024-02-11 ENCOUNTER — Telehealth: Payer: Self-pay | Admitting: *Deleted

## 2024-02-11 DIAGNOSIS — L02416 Cutaneous abscess of left lower limb: Secondary | ICD-10-CM | POA: Diagnosis not present

## 2024-02-11 DIAGNOSIS — R6521 Severe sepsis with septic shock: Secondary | ICD-10-CM | POA: Diagnosis not present

## 2024-02-11 DIAGNOSIS — L03116 Cellulitis of left lower limb: Secondary | ICD-10-CM | POA: Diagnosis not present

## 2024-02-11 DIAGNOSIS — A419 Sepsis, unspecified organism: Secondary | ICD-10-CM | POA: Diagnosis not present

## 2024-02-11 LAB — CBC
HCT: 31.1 % — ABNORMAL LOW (ref 39.0–52.0)
Hemoglobin: 9.6 g/dL — ABNORMAL LOW (ref 13.0–17.0)
MCH: 29.5 pg (ref 26.0–34.0)
MCHC: 30.9 g/dL (ref 30.0–36.0)
MCV: 95.7 fL (ref 80.0–100.0)
Platelets: 274 10*3/uL (ref 150–400)
RBC: 3.25 MIL/uL — ABNORMAL LOW (ref 4.22–5.81)
RDW: 14.9 % (ref 11.5–15.5)
WBC: 9.5 10*3/uL (ref 4.0–10.5)
nRBC: 0 % (ref 0.0–0.2)

## 2024-02-11 LAB — RENAL FUNCTION PANEL
Albumin: 2.1 g/dL — ABNORMAL LOW (ref 3.5–5.0)
Anion gap: 8 (ref 5–15)
BUN: 39 mg/dL — ABNORMAL HIGH (ref 8–23)
CO2: 25 mmol/L (ref 22–32)
Calcium: 8.8 mg/dL — ABNORMAL LOW (ref 8.9–10.3)
Chloride: 104 mmol/L (ref 98–111)
Creatinine, Ser: 1.04 mg/dL (ref 0.61–1.24)
GFR, Estimated: >60 mL/min (ref >60)
Glucose, Bld: 109 mg/dL — ABNORMAL HIGH (ref 70–99)
Phosphorus: 2.7 mg/dL (ref 2.5–4.6)
Potassium: 4.4 mmol/L (ref 3.5–5.1)
Sodium: 137 mmol/L (ref 135–145)

## 2024-02-11 LAB — GLUCOSE, CAPILLARY
Glucose-Capillary: 102 mg/dL — ABNORMAL HIGH (ref 70–99)
Glucose-Capillary: 109 mg/dL — ABNORMAL HIGH (ref 70–99)
Glucose-Capillary: 114 mg/dL — ABNORMAL HIGH (ref 70–99)
Glucose-Capillary: 116 mg/dL — ABNORMAL HIGH (ref 70–99)
Glucose-Capillary: 125 mg/dL — ABNORMAL HIGH (ref 70–99)

## 2024-02-11 LAB — CULTURE, BLOOD (ROUTINE X 2)
Culture: NO GROWTH
Culture: NO GROWTH
Special Requests: ADEQUATE

## 2024-02-11 LAB — MAGNESIUM: Magnesium: 2.4 mg/dL (ref 1.7–2.4)

## 2024-02-11 MED ORDER — TORSEMIDE 20 MG PO TABS
20.0000 mg | ORAL_TABLET | Freq: Every day | ORAL | Status: DC
  Administered 2024-02-11 – 2024-02-13 (×3): 20 mg via ORAL
  Filled 2024-02-11 (×3): qty 1

## 2024-02-11 NOTE — Progress Notes (Signed)
  Inpatient Rehabilitation Admissions Coordinator   Asked to assess for Cir per pt/family request. Patient not at a level to tolerate the intensity required to pursue a CIR admit at this time. Other venues should be pursued.  Jeannetta Millman, RN, MSN Rehab Admissions Coordinator [phone] 02/11/2024 3:38 PM

## 2024-02-11 NOTE — Plan of Care (Signed)
  Problem: Coping: Goal: Ability to adjust to condition or change in health will improve Outcome: Progressing   Problem: Fluid Volume: Goal: Ability to maintain a balanced intake and output will improve Outcome: Progressing   Problem: Education: Goal: Ability to describe self-care measures that may prevent or decrease complications (Diabetes Survival Skills Education) will improve Outcome: Progressing   Problem: Health Behavior/Discharge Planning: Goal: Ability to identify and utilize available resources and services will improve Outcome: Progressing

## 2024-02-11 NOTE — Plan of Care (Signed)
   Problem: Coping: Goal: Ability to adjust to condition or change in health will improve Outcome: Progressing   Problem: Skin Integrity: Goal: Risk for impaired skin integrity will decrease Outcome: Progressing   Problem: Education: Goal: Knowledge of General Education information will improve Description: Including pain rating scale, medication(s)/side effects and non-pharmacologic comfort measures Outcome: Progressing

## 2024-02-11 NOTE — Progress Notes (Signed)
 Orthopedic Tech Progress Note Patient Details:  [name] May 05, 1956 [id number]  Patient ID: [name], male   DOB: May 28, 1956, 68 y.o.   MRN: [id number] Per day shift ortho tech, unna boots are currently being held off due to patients excessive weeping. [name] 02/11/2024, 8:29 PM

## 2024-02-11 NOTE — Transitions of Care (Post Inpatient/ED Visit) (Signed)
  Transition of Care week 3  Visit Note  02/11/2024  Name: [name] MRN: [id number]          DOB: 10/07/1956  Situation: Patient enrolled in [facility] 30-day program. Visit completed with patient by telephone. RN spoke with patient and realize he was inpatient at [facility].   Background:   Initial Transition Care Management Follow-up Telephone Call    Past Medical History:  Diagnosis Date   AICD (automatic cardioverter/defibrillator) present    Anemia    supposed to be taking Vit B but doesn't   ANXIETY    takes Xanax  nightly   Arthritis    Asthma    Albuterol  prn and Advair  daily;also takes Prednisone  daily   Cardiomyopathy [facility])    a. EF 25% TEE July 2013; b. EF normalized 2015;  c. 03/2015 Echo: EF 40-45%, difrf HK, PASP 38 mmHg, Mild MR, sev LAE/RAE.   Chronic constipation    takes OTC stool softener   COPD (chronic obstructive pulmonary disease) (HCC)    "one dr says COPD; one dr says emphysema" (09/18/2017)   COVID-19 12/09/2019   DEPRESSION    takes Zoloft  and Doxepin  daily   Diverticulitis    DYSKINESIA, ESOPHAGUS    Essential hypertension        FIBROMYALGIA    GERD (gastroesophageal reflux disease)        Glaucoma    HYPERLIPIDEMIA    a. Intolerant to statins.   INSOMNIA    takes Ambien  nightly   Myocardial infarction [facility])    a. 2012 Myoview  notable for prior infarct;  b. 03/2015 Lexiscan  CL: EF 37%, diff HK, small area of inferior infarct from apex to base-->Med Rx.   O2 dependent    "2.5L q hs & prn" (09/18/2017)   Paroxysmal atrial fibrillation (HCC)    a. CHA2DS2VASc = 3--> takes Coumadin ;  b. 03/15/2015 Successful TEE/DCCV;  c. 03/2015 recurrent afib, Amio d/c'd in setting of hyperthyroidism.   Peripheral neuropathy    Pneumonia 12/2016   Rash and other nonspecific skin eruption 04/12/2009   no cause found saw dermatologists x 2 and allergist   SLEEP APNEA, OBSTRUCTIVE    a. doesn't use CPAP   Syncope    a. 03/2015 s/p MDT LINQ.   Type II diabetes  mellitus (HCC)         Assessment: Not assessed patient he has been readmitted. Patient Reported Symptoms: Cognitive        Neurological      HEENT        Cardiovascular      Respiratory      Endocrine      Gastrointestinal        Genitourinary      Integumentary      Musculoskeletal          Psychosocial           There were no vitals filed for this visit.  Medications Reviewed Today   Medications were not reviewed in this encounter     Recommendation:   none  Follow Up Plan:   Patient will be reassess once discharged from hospital.   Una Ganser BSN RN [facility] Health Care Management Coordinator [email or web address] Direct Dial: [phone]  Fax: [phone] Website: [email or web address]

## 2024-02-11 NOTE — TOC Progression Note (Addendum)
 Transition of Care [facility]) - Progression Note    Patient Details  Name: [name] MRN: [id number] Date of Birth: [birth year]  Transition of Care [facility] CM/SW Contact  Arron Big, [place] Phone Number: 02/11/2024, 3:38 PM  Clinical Narrative:   CSW met patient and son a bedside. Son inquired about CIR vs SNF. CSW asked IP Rehab coordinators if CIR is an option for patient. Per PT and IP Rehab, CIR is not an option fo rthis patient. CSW notified patients son, [name].   [name] and his father chose tentatively chose [facility] for STR. Per [name], he is going to speak with patients case worker to determine the best choice of facility for his father.   TOC will continue to follow.    Expected Discharge Plan: Skilled Nursing Facility Barriers to Discharge: Continued Medical Work up, English as a second language teacher  Expected Discharge Plan and Services In-house Referral: Clinical Social Work     Living arrangements for the past 2 months: Single Family Home                                       Social Determinants of Health (SDOH) Interventions SDOH Screenings   Food Insecurity: No Food Insecurity (02/09/2024)  Housing: High Risk (02/09/2024)  Transportation Needs: No Transportation Needs (02/09/2024)  Utilities: Not At Risk (02/09/2024)  Alcohol Screen: Low Risk  (09/16/2023)  Depression (PHQ2-9): Low Risk  (01/28/2024)  Financial Resource Strain: Medium Risk (01/01/2024)  Physical Activity: Inactive (01/01/2024)  Social Connections: Socially Isolated (02/09/2024)  Stress: No Stress Concern Present (01/01/2024)  Tobacco Use: Medium Risk (02/06/2024)    Readmission Risk Interventions    02/07/2024    2:21 PM 11/22/2022    1:34 PM  Readmission Risk Prevention Plan  Post Dischage Appt  Complete  Medication Screening  Complete  Transportation Screening Complete Complete  PCP or Specialist Appt within 3-5 Days Complete   HRI or Home Care Consult Complete   Social Work Consult for  Recovery Care Planning/Counseling Complete   Palliative Care Screening Not Applicable   Medication Review Oceanographer) Referral to Pharmacy

## 2024-02-11 NOTE — Progress Notes (Signed)
 Physical Therapy Treatment Patient Details Name: [name] MRN: [id number] DOB: 1956-08-28 Today's Date: 02/11/2024   History of Present Illness 68 year old male admitted 02/07/24 with weakness, AKI, hypotension, LLE cellulitis. PMHx recent admit for sepsis 4/11-4/14 due to viral gastroenteritis. COPD on 2L O2, asthma overlap syndrome, OSA, T2DM, HTN, A-fib, cardiomyopathy EF 40%, s/p BiV ICD, torticollis, cervical fx, neuropathy    PT Comments  Pt resting in bed on arrival and agreeable to session. Pt continues to be limited in safe mobility by weakness, decreased activity tolerance, poor balance/postural reactions and decreased insight into current deficits. Pt continues to require max A +2 to complete bed mobility, with pt reporting that he sleeps in a recliner at home. Pt able to come to full standing this session with mod-max A +2 to boost from elevated EOB wit RW for support. Pt able to take a few steps along EOB to [facility], however unable to progress gait away from EOB due to fatigue. Patient will benefit from continued inpatient follow up therapy, <3 hours/day, will continue to follow acutely.    If plan is discharge home, recommend the following: Assistance with cooking/housework;Assist for transportation;Help with stairs or ramp for entrance;Two people to help with walking and/or transfers;Two people to help with bathing/dressing/bathroom;Direct supervision/assist for medications management;Direct supervision/assist for financial management;Supervision due to cognitive status   Can travel by private vehicle     No  Equipment Recommendations  Wheelchair (measurements PT);Hoyer lift;Hospital bed;Wheelchair cushion (measurements PT)    Recommendations for Other Services       Precautions / Restrictions Precautions Precautions: Fall;Other (comment) Recall of Precautions/Restrictions: Impaired Precaution/Restrictions Comments: monitor BP and O2, Restrictions Weight Bearing Restrictions  Per Provider Order: No     Mobility  Bed Mobility Overal bed mobility: Needs Assistance Bed Mobility: Supine to Sit, Sit to Supine     Supine to sit: Max assist, +2 for physical assistance Sit to supine: +2 for physical assistance, Max assist   General bed mobility comments: max A +2 for all aspects, pt reporting that he normally sleeps in his recliner    Transfers Overall transfer level: Needs assistance Equipment used: Rolling walker (2 wheels) Transfers: Sit to/from Stand Sit to Stand: Max assist, +2 physical assistance, Mod assist, From elevated surface           General transfer comment: max A +2 on first attempt, fading to mod A  +2 on second attempt, able to take a few steps along EOB to [facility]    Ambulation/Gait             Pre-gait activities: standing marching x5 General Gait Details: unable to progress gait away from EOB   Stairs             Wheelchair Mobility     Tilt Bed    Modified Rankin (Stroke Patients Only)       Balance Overall balance assessment: Needs assistance Sitting-balance support: Feet supported, Bilateral upper extremity supported Sitting balance-Leahy Scale: Poor Sitting balance - Comments: unilateral UE support at EOB   Standing balance support: Bilateral upper extremity supported, During functional activity, Reliant on assistive device for balance Standing balance-Leahy Scale: Poor Standing balance comment: heavy reliance on UE support of RW                            Communication Communication Communication: No apparent difficulties  Cognition Arousal: Alert Behavior During Therapy: Flat affect   PT - Cognitive  impairments: Attention, Initiation, Problem solving, Safety/Judgement, Memory                       PT - Cognition Comments: poor awareeness of deficits and impact on current mobility level Following commands: Impaired Following commands impaired: Follows one step commands with  increased time    Cueing Cueing Techniques: Verbal cues, Gestural cues, Tactile cues  Exercises      General Comments General comments (skin integrity, edema, etc.): VSS      Pertinent Vitals/Pain Pain Assessment Pain Assessment: Faces Faces Pain Scale: Hurts little more Pain Location: BLE Pain Descriptors / Indicators: Aching, Sore, Grimacing Pain Intervention(s): Monitored during session, Limited activity within patient's tolerance    Home Living                          Prior Function            PT Goals (current goals can now be found in the care plan section) Acute Rehab PT Goals Patient Stated Goal: be able to walk PT Goal Formulation: With patient Time For Goal Achievement: 02/22/24 Progress towards PT goals: Progressing toward goals    Frequency    Min 2X/week      PT Plan      Co-evaluation              AM-PAC PT "6 Clicks" Mobility   Outcome Measure  Help needed turning from your back to your side while in a flat bed without using bedrails?: A Lot Help needed moving from lying on your back to sitting on the side of a flat bed without using bedrails?: A Lot Help needed moving to and from a bed to a chair (including a wheelchair)?: Total Help needed standing up from a chair using your arms (e.g., wheelchair or bedside chair)?: Total Help needed to walk in hospital room?: Total Help needed climbing 3-5 steps with a railing? : Total 6 Click Score: 8    End of Session Equipment Utilized During Treatment: Oxygen  Activity Tolerance: Patient limited by fatigue Patient left: in bed;with call bell/phone within reach;with bed alarm set Nurse Communication: Mobility status PT Visit Diagnosis: Unsteadiness on feet (R26.81);Other abnormalities of gait and mobility (R26.89);Muscle weakness (generalized) (M62.81);History of falling (Z91.81);Difficulty in walking, not elsewhere classified (R26.2)     Time: 1478-2956 PT Time Calculation (min)  (ACUTE ONLY): 27 min  Charges:    $Gait Training: 8-22 mins $Therapeutic Activity: 8-22 mins PT General Charges $$ ACUTE PT VISIT: 1 Visit                     Latessa Tillis R. PTA Acute Rehabilitation Services Office: [phone]   Agapito Horseman 02/11/2024, 12:38 PM

## 2024-02-11 NOTE — Progress Notes (Signed)
 PROGRESS NOTE  [name] WUJ:[id number] DOB: March 26, 1956   PCP: Roslyn Coombe, MD  Patient is from: Home.  Lives with girlfriend.  DOA: 02/06/2024 LOS: 5  Chief complaints Chief Complaint  Patient presents with   Weakness     Brief Narrative / Interim history: 68 year old M with PMH of COPD, OSA not on CPAP, chronic hypoxic RF on 2 L, systolic CHF s/p BiV ICD, A-fib on Eliquis , morbid obesity, anxiety, depression, adrenal insufficiency due to chronic steroid and recent hospitalization due to septic shock in the setting of viral gastroenteritis, diarrhea, syncopal episode and AKI, returning with generalized weakness, and admitted to ICU due to septic shock in the setting of LLE cellulitis and AKI.  In ED, hypotensive.  Cr 1.9 (from 1.03 days prior).  WBC 20.8.  Troponin 21.  LLE noted to be warm, red and painful.  Cultures obtained.  Patient was started on broad-spectrum antibiotics and vasopressor and admitted to ICU.  Patient came off vasopressor on 4/25.  AKI improved.  Transferred out of ICU on 4/28. Antibiotics de-escalated to IV Ancef .  Therapy recommended SNF but patient prefers to go home when medically stable.   Subjective: Seen and examined earlier this morning.  No major events overnight of this morning.  No complaints.  Eager to go home  Objective: Vitals:   02/11/24 0427 02/11/24 0755 02/11/24 1008 02/11/24 1013  BP:  124/82 107/61 107/61  Pulse: 71 69 70 70  Resp: 18 19 (!) 23   Temp:  97.7 F (36.5 C) 98.1 F (36.7 C)   TempSrc:  Oral Oral   SpO2: 96% 100% 98%   Weight:      Height:        Examination:  GENERAL: No apparent distress.  Nontoxic. HEENT: MMM.  Vision and hearing grossly intact.  NECK: No apparent JVD.  Chronic cervical contracture. RESP:  No IWOB.  Fair aeration bilaterally but limited exam due to body habitus. CVS:  RRR. Heart sounds normal.  ABD/GI/GU: BS+. Abd soft, NTND.  MSK/EXT:  Moves extremities. No apparent deformity. No edema.   SKIN: New Ace wrap over BLE. NEURO: Awake, alert and oriented appropriately.  No apparent focal neuro deficit. PSYCH: Calm. Normal affect.   Consultants:  Pulmonology admitted patient  Procedures: None  Microbiology summarized: MRSA PCR screen negative Blood cultures NGTD  Assessment and plan: Septic shock 2/2 to LLE cellulitis: Present on admission.  Sepsis physiology resolved.  Culture data as above.  MRI with severe soft tissue edema concerning for cellulitis and/or venous insufficiency but no osteomyelitis.  Blood cultures NGTD.  Leukocytosis resolved. - Cefepime  and Vanco 4/24>> IV Ancef  4/28>>> - Continue wound care  Hx relative adrenal insufficiency (chronic prednisone  2/2 shoulder arthritis per pt):  -Now stable on home prednisone .  AKI: Likely ATN in the setting of septic shock.  Also on ibuprofen , losartan  and torsemide  at home.  Baseline 0.9-1.0.  AKI resolved.  Recent Labs    01/26/24 0627 01/27/24 0922 02/03/24 1615 02/06/24 1320 02/07/24 0235 02/08/24 0430 02/08/24 1723 02/09/24 0500 02/10/24 0241 02/11/24 0240  BUN 37* 26* 26* 31* 41* 60* 66* 64* 56* 39*  CREATININE 1.40* 0.86 0.99 1.89* 2.77* 3.19* 2.93* 2.27* 1.60* 1.04  -Resume home torsemide  at low-dose -Continue monitoring -Continue holding losartan  and eplerenone . -Advised to avoid NSAID.  Chronic HFrEF/NICM S/p BiV ICD/pacemaker: TTE in 10/2023 with LVEF of 40 to 45%, indeterminate DD, normal PASP.  On torsemide  and losartan  at home.  Appears euvolemic on exam  but difficult due to body habitus. -Resume home torsemide  at reduced dose -Continue low-dose Coreg -started here. -Continue holding losartan  and eplerenone   Paroxysmal atrial fibrillation on Eliquis : Rate controlled - Continue Coreg .   - On Eliquis  for anticoagulation - Optimize electrolytes  Chronic COPD/ chronic hypoxic respiratory failure: On 2 L - Continue home breathing treatments  OSA intolerant to CPAP - Continue home  oxygen   Essential hypertension: Normotensive - Continue low-dose Coreg  - Continue holding losartan    Chronic pain prior cervical spine fx 05/2023/ contraction of neck - Resumed home tramadol , Soma  and gabapentin  at reduced dose - Discussed risk of polypharmacy  Anxiety/ depression/insomnia - Continue home meds.  Discussed risk of polypharmacy.  Chronic Debility: Limited mobility.  - Therapy recommended SNF but patient prefers to go home.  HH and DME ordered.  Polypharmacy: Takes about 6 tablets of Tylenol  PM at night in addition to Soma , gabapentin , tramadol , Ambien  and Xanax .  Extensive discussion about polypharmacy.  - Will continue home tramadol , Soma  and Ambien  at reduced dose. - Continue home gabapentin    Morbid obesity Body mass index is 40.11 kg/m.          DVT prophylaxis:   apixaban  (ELIQUIS ) tablet 5 mg  Code Status: Full code Family Communication: None at bedside Level of care: Telemetry Cardiac Status is: Inpatient Remains inpatient appropriate because: Septic shock due to LLE cellulitis, AKI   Final disposition: Home with home health and DME on 4/30 if creatinine stable after resuming diuretics   55 minutes with more than 50% spent in reviewing records, counseling patient/family and coordinating care.   Sch Meds:  Scheduled Meds:  acetaminophen   1,000 mg Oral Q6H WA   apixaban   5 mg Oral BID   arformoterol   15 mcg Nebulization BID   budesonide  (PULMICORT ) nebulizer solution  0.5 mg Nebulization BID   carisoprodol   350 mg Oral TID   carvedilol   3.125 mg Oral BID WC   Chlorhexidine  Gluconate Cloth  6 each Topical Daily   DULoxetine   60 mg Oral Daily   feeding supplement  237 mL Oral BID BM   gabapentin   100 mg Oral TID   leptospermum manuka honey  1 Application Topical Daily   pantoprazole   40 mg Oral Daily   predniSONE   10 mg Oral Q breakfast   QUEtiapine  25 mg Oral QHS   revefenacin   175 mcg Nebulization Daily   tamsulosin   0.4 mg Oral Daily    torsemide   20 mg Oral Daily   venlafaxine  XR  150 mg Oral Q breakfast   Continuous Infusions:   ceFAZolin  (ANCEF ) IV 2 g (02/11/24 0558)   PRN Meds:.albuterol , ALPRAZolam , calcium  carbonate, naphazoline-glycerin , ondansetron  (ZOFRAN ) IV, mouth rinse, traMADol   Antimicrobials: Anti-infectives (From admission, onward)    Start     Dose/Rate Route Frequency Ordered Stop   02/10/24 0845  ceFAZolin  (ANCEF ) IVPB 2g/100 mL premix        2 g 200 mL/hr over 30 Minutes Intravenous Every 8 hours 02/10/24 0753 02/13/24 0559   02/09/24 0815  vancomycin  (VANCOCIN ) IVPB 1000 mg/200 mL premix        1,000 mg 200 mL/hr over 60 Minutes Intravenous  Once 02/09/24 0721 02/09/24 1011   02/08/24 1228  vancomycin  variable dose per unstable renal function (pharmacist dosing)  Status:  Discontinued         Does not apply See admin instructions 02/08/24 1228 02/10/24 0738   02/07/24 2000  vancomycin  (VANCOCIN ) IVPB 1000 mg/200 mL premix  Status:  Discontinued        1,000 mg 200 mL/hr over 60 Minutes Intravenous Every 24 hours 02/07/24 0915 02/08/24 1227   02/07/24 0800  ceFEPIme  (MAXIPIME ) 2 g in sodium chloride  0.9 % 100 mL IVPB  Status:  Discontinued        2 g 200 mL/hr over 30 Minutes Intravenous Every 12 hours 02/06/24 2116 02/10/24 0738   02/06/24 1945  ceFEPIme  (MAXIPIME ) 2 g in sodium chloride  0.9 % 100 mL IVPB        2 g 200 mL/hr over 30 Minutes Intravenous  Once 02/06/24 1936 02/06/24 2040   02/06/24 1945  vancomycin  (VANCOREADY) IVPB 2000 mg/400 mL        2,000 mg 200 mL/hr over 120 Minutes Intravenous  Once 02/06/24 1936 02/06/24 2257        I have personally reviewed the following labs and images: CBC: Recent Labs  Lab 02/06/24 1320 02/07/24 0235 02/08/24 0219 02/08/24 0430 02/09/24 0500 02/09/24 1423 02/10/24 0241 02/11/24 0240  WBC 20.8* 28.6*  --  22.7* 15.3*  --  11.7* 9.5  NEUTROABS 20.0*  --   --   --   --   --   --   --   HGB 10.2* 11.0*   < > 9.6* 9.1* 10.2* 9.3*  9.6*  HCT 36.3* 36.5*   < > 30.3* 28.7* 30.0* 29.7* 31.1*  MCV 107.7* 100.8*  --  95.3 94.4  --  95.2 95.7  PLT 233 291  --  286 260  --  274 274   < > = values in this interval not displayed.   BMP &GFR Recent Labs  Lab 02/07/24 0235 02/08/24 0219 02/08/24 0430 02/08/24 1723 02/09/24 0500 02/09/24 1423 02/10/24 0241 02/11/24 0240  NA 135   < > 129* 129* 131* 130* 135 137  K 4.8   < > 4.6 4.4 4.1 4.4 4.7 4.4  CL 97*  --  93* 92* 94*  --  102 104  CO2 24  --  22 23 23   --  24 25  GLUCOSE 66*  --  119* 114* 97  --  91 109*  BUN 41*  --  60* 66* 64*  --  56* 39*  CREATININE 2.77*  --  3.19* 2.93* 2.27*  --  1.60* 1.04  CALCIUM  8.3*  --  7.7* 8.1* 8.4*  --  9.0 8.8*  MG 1.5*  --  2.2  --   --   --   --  2.4  PHOS 6.2*  --   --   --   --   --   --  2.7   < > = values in this interval not displayed.   Estimated Creatinine Clearance: 89.6 mL/min (by C-G formula based on SCr of 1.04 mg/dL). Liver & Pancreas: Recent Labs  Lab 02/06/24 1320 02/11/24 0240  AST 25  --   ALT 28  --   ALKPHOS 67  --   BILITOT 0.8  --   PROT 5.1*  --   ALBUMIN  2.8* 2.1*   No results for input(s): "LIPASE", "AMYLASE" in the last 168 hours. No results for input(s): "AMMONIA" in the last 168 hours. Diabetic: No results for input(s): "HGBA1C" in the last 72 hours. Recent Labs  Lab 02/10/24 1544 02/10/24 2029 02/11/24 0011 02/11/24 0423 02/11/24 1109  GLUCAP 100* 108* 116* 114* 109*   Cardiac Enzymes: No results for input(s): "CKTOTAL", "CKMB", "CKMBINDEX", "TROPONINI" in the last 168 hours. No results for  input(s): "PROBNP" in the last 8760 hours. Coagulation Profile: No results for input(s): "INR", "PROTIME" in the last 168 hours. Thyroid  Function Tests: No results for input(s): "TSH", "T4TOTAL", "FREET4", "T3FREE", "THYROIDAB" in the last 72 hours. Lipid Profile: No results for input(s): "CHOL", "HDL", "LDLCALC", "TRIG", "CHOLHDL", "LDLDIRECT" in the last 72 hours. Anemia Panel: No  results for input(s): "VITAMINB12", "FOLATE", "FERRITIN", "TIBC", "IRON", "RETICCTPCT" in the last 72 hours. Urine analysis:    Component Value Date/Time   COLORURINE AMBER (A) 02/08/2024 0430   APPEARANCEUR HAZY (A) 02/08/2024 0430   LABSPEC 1.024 02/08/2024 0430   PHURINE 5.0 02/08/2024 0430   GLUCOSEU NEGATIVE 02/08/2024 0430   GLUCOSEU NEGATIVE 01/07/2024 1546   HGBUR SMALL (A) 02/08/2024 0430   BILIRUBINUR NEGATIVE 02/08/2024 0430   KETONESUR 5 (A) 02/08/2024 0430   PROTEINUR 30 (A) 02/08/2024 0430   UROBILINOGEN 0.2 01/07/2024 1546   NITRITE NEGATIVE 02/08/2024 0430   LEUKOCYTESUR TRACE (A) 02/08/2024 0430   Sepsis Labs: Invalid input(s): "PROCALCITONIN", "LACTICIDVEN"  Microbiology: Recent Results (from the past 240 hours)  Blood culture (routine x 2)     Status: None   Collection Time: 02/06/24  8:05 PM   Specimen: BLOOD RIGHT FOREARM  Result Value Ref Range Status   Specimen Description BLOOD RIGHT FOREARM  Final   Special Requests   Final    BOTTLES DRAWN AEROBIC AND ANAEROBIC Blood Culture results may not be optimal due to an inadequate volume of blood received in culture bottles   Culture   Final    NO GROWTH 5 DAYS Performed at [facility], [address] [place], [place] [address]    Report Status 02/11/2024 FINAL  Final  Blood culture (routine x 2)     Status: None   Collection Time: 02/06/24  8:15 PM   Specimen: BLOOD LEFT ARM  Result Value Ref Range Status   Specimen Description BLOOD LEFT ARM  Final   Special Requests   Final    BOTTLES DRAWN AEROBIC AND ANAEROBIC Blood Culture adequate volume   Culture   Final    NO GROWTH 5 DAYS Performed at [facility], [address], [place], [place] [address]    Report Status 02/11/2024 FINAL  Final  MRSA Next Gen by PCR, Nasal     Status: None   Collection Time: 02/06/24 10:25 PM   Specimen: Nasal Mucosa; Nasal Swab  Result Value Ref Range Status   MRSA by PCR Next Gen NOT DETECTED NOT  DETECTED Final    Comment: (NOTE) The GeneXpert MRSA Assay (FDA approved for NASAL specimens only), is one component of a comprehensive MRSA colonization surveillance program. It is not intended to diagnose MRSA infection nor to guide or monitor treatment for MRSA infections. Test performance is not FDA approved in patients less than 34 years old. Performed at [facility], [address], [place], [place] [address]     Radiology Studies: No results found.    Aurianna Earlywine T. Chester Romero [facility]  If 7PM-7AM, please contact night-coverage [email or web address] 02/11/2024, 11:17 AM

## 2024-02-12 ENCOUNTER — Telehealth (HOSPITAL_COMMUNITY): Payer: Self-pay | Admitting: *Deleted

## 2024-02-12 ENCOUNTER — Other Ambulatory Visit (HOSPITAL_COMMUNITY): Payer: Self-pay

## 2024-02-12 DIAGNOSIS — R6521 Severe sepsis with septic shock: Secondary | ICD-10-CM | POA: Diagnosis not present

## 2024-02-12 DIAGNOSIS — L02416 Cutaneous abscess of left lower limb: Secondary | ICD-10-CM | POA: Diagnosis not present

## 2024-02-12 DIAGNOSIS — L03116 Cellulitis of left lower limb: Secondary | ICD-10-CM | POA: Diagnosis not present

## 2024-02-12 DIAGNOSIS — A419 Sepsis, unspecified organism: Secondary | ICD-10-CM | POA: Diagnosis not present

## 2024-02-12 LAB — RENAL FUNCTION PANEL
Albumin: 2.3 g/dL — ABNORMAL LOW (ref 3.5–5.0)
Anion gap: 8 (ref 5–15)
BUN: 31 mg/dL — ABNORMAL HIGH (ref 8–23)
CO2: 27 mmol/L (ref 22–32)
Calcium: 8.7 mg/dL — ABNORMAL LOW (ref 8.9–10.3)
Chloride: 102 mmol/L (ref 98–111)
Creatinine, Ser: 1 mg/dL (ref 0.61–1.24)
GFR, Estimated: >60 mL/min (ref >60)
Glucose, Bld: 78 mg/dL (ref 70–99)
Phosphorus: 2.2 mg/dL — ABNORMAL LOW (ref 2.5–4.6)
Potassium: 4.9 mmol/L (ref 3.5–5.1)
Sodium: 137 mmol/L (ref 135–145)

## 2024-02-12 LAB — CBC
HCT: 32.6 % — ABNORMAL LOW (ref 39.0–52.0)
Hemoglobin: 10 g/dL — ABNORMAL LOW (ref 13.0–17.0)
MCH: 29.6 pg (ref 26.0–34.0)
MCHC: 30.7 g/dL (ref 30.0–36.0)
MCV: 96.4 fL (ref 80.0–100.0)
Platelets: 297 10*3/uL (ref 150–400)
RBC: 3.38 MIL/uL — ABNORMAL LOW (ref 4.22–5.81)
RDW: 14.8 % (ref 11.5–15.5)
WBC: 12.1 10*3/uL — ABNORMAL HIGH (ref 4.0–10.5)
nRBC: 0 % (ref 0.0–0.2)

## 2024-02-12 LAB — GLUCOSE, CAPILLARY
Glucose-Capillary: 103 mg/dL — ABNORMAL HIGH (ref 70–99)
Glucose-Capillary: 121 mg/dL — ABNORMAL HIGH (ref 70–99)
Glucose-Capillary: 66 mg/dL — ABNORMAL LOW (ref 70–99)
Glucose-Capillary: 79 mg/dL (ref 70–99)
Glucose-Capillary: 89 mg/dL (ref 70–99)
Glucose-Capillary: 97 mg/dL (ref 70–99)

## 2024-02-12 LAB — MAGNESIUM: Magnesium: 1.9 mg/dL (ref 1.7–2.4)

## 2024-02-12 MED ORDER — METOPROLOL TARTRATE 25 MG PO TABS
12.5000 mg | ORAL_TABLET | Freq: Two times a day (BID) | ORAL | 1 refills | Status: DC
  Filled 2024-02-12: qty 90, 90d supply, fill #0

## 2024-02-12 MED ORDER — CEFADROXIL 500 MG PO CAPS
1000.0000 mg | ORAL_CAPSULE | Freq: Two times a day (BID) | ORAL | 0 refills | Status: DC
  Filled 2024-02-12: qty 20, 5d supply, fill #0

## 2024-02-12 MED ORDER — TORSEMIDE 20 MG PO TABS
20.0000 mg | ORAL_TABLET | Freq: Every day | ORAL | 8 refills | Status: DC
  Filled 2024-02-12: qty 30, 30d supply, fill #0
  Filled 2024-02-12: qty 300, 300d supply, fill #0

## 2024-02-12 MED ORDER — MEDIHONEY WOUND/BURN DRESSING EX PSTE
1.0000 | PASTE | Freq: Every day | CUTANEOUS | 2 refills | Status: DC
  Filled 2024-02-12: qty 44, 44d supply, fill #0

## 2024-02-12 NOTE — Progress Notes (Signed)
 CCMD called and claimed patient had wide QRS. RN went into the room to check the patient and he said he was fine. MD notified.

## 2024-02-12 NOTE — TOC Progression Note (Addendum)
 Transition of Care [facility]) - Progression Note    Patient Details  Name: [name] MRN: [id number] Date of Birth: [birth year]  Transition of Care [facility]) CM/SW Contact  Arron Big, [place] Phone Number: 02/12/2024, 10:39 AM  Clinical Narrative:   CSW spoke with patient and son at bedside. They have chosen [facility] for SNF at this time. CSW notified facility. CSW will start auth today.   10:51 AM Auth id [id number]. Auth approved 02/12/2024-02/14/2024 for [facility].   11:01 AM CSW reached out to [facility] to inquire about patient potentially DC today if medically ready. Awaiting a response.   11:22 AM Facility will reach out to family to fill out admission paperwork.   TOC will continue to follow.    Expected Discharge Plan: Skilled Nursing Facility Barriers to Discharge: Continued Medical Work up, English as a second language teacher  Expected Discharge Plan and Services In-house Referral: Clinical Social Work     Living arrangements for the past 2 months: Single Family Home Expected Discharge Date: 02/12/24                                     Social Determinants of Health (SDOH) Interventions SDOH Screenings   Food Insecurity: No Food Insecurity (02/09/2024)  Housing: High Risk (02/09/2024)  Transportation Needs: No Transportation Needs (02/09/2024)  Utilities: Not At Risk (02/09/2024)  Alcohol Screen: Low Risk  (09/16/2023)  Depression (PHQ2-9): Low Risk  (01/28/2024)  Financial Resource Strain: Medium Risk (01/01/2024)  Physical Activity: Inactive (01/01/2024)  Social Connections: Socially Isolated (02/09/2024)  Stress: No Stress Concern Present (01/01/2024)  Tobacco Use: Medium Risk (02/06/2024)    Readmission Risk Interventions    02/07/2024    2:21 PM 11/22/2022    1:34 PM  Readmission Risk Prevention Plan  Post Dischage Appt  Complete  Medication Screening  Complete  Transportation Screening Complete Complete  PCP or Specialist Appt within 3-5 Days Complete   HRI or  Home Care Consult Complete   Social Work Consult for Recovery Care Planning/Counseling Complete   Palliative Care Screening Not Applicable   Medication Review Oceanographer) Referral to Pharmacy

## 2024-02-12 NOTE — Progress Notes (Signed)
 PROGRESS NOTE  [name] ZOX:[id number] DOB: [birth year]   PCP: Roslyn Coombe, MD  Patient is from: Home.  Lives with girlfriend.  DOA: 02/06/2024 LOS: 6  Chief complaints Chief Complaint  Patient presents with   Weakness     Brief Narrative / Interim history: 68 year old M with PMH of COPD, OSA not on CPAP, chronic hypoxic RF on 2 L, systolic CHF s/p BiV ICD, A-fib on Eliquis , morbid obesity, anxiety, depression, adrenal insufficiency due to chronic steroid and recent hospitalization due to septic shock in the setting of viral gastroenteritis, diarrhea, syncopal episode and AKI, returning with generalized weakness, and admitted to ICU due to septic shock in the setting of LLE cellulitis and AKI.  In ED, hypotensive.  Cr 1.9 (from 1.03 days prior).  WBC 20.8.  Troponin 21.  LLE noted to be warm, red and painful.  Cultures obtained.  Patient was started on broad-spectrum antibiotics and vasopressor and admitted to ICU.  Patient came off vasopressor on 4/25.  AKI improved.  Transferred out of ICU on 4/28. Antibiotics de-escalated to IV Ancef .  Medically stable for discharge but waiting on SNF bed.  Subjective: Seen and examined earlier this morning.  No major events overnight of this morning.  No complaints.  Patient's son at bedside.  Objective: Vitals:   02/12/24 1100 02/12/24 1106 02/12/24 1110 02/12/24 1111  BP: (!) 187/159 (!) 252/216 (!) 224/194 (!) 236/171  Pulse:      Resp:  (!) 23    Temp:      TempSrc:      SpO2:      Weight:      Height:        Examination:  GENERAL: No apparent distress.  Nontoxic. HEENT: MMM.  Vision and hearing grossly intact.  NECK: No apparent JVD.  Chronic cervical contracture. RESP:  No IWOB.  Fair aeration bilaterally but limited exam due to body habitus. CVS:  RRR. Heart sounds normal.  ABD/GI/GU: BS+. Abd soft, NTND.  MSK/EXT:  Moves extremities. No apparent deformity. No edema.  SKIN: New Ace wrap over BLE. NEURO: Awake, alert and  oriented appropriately.  No apparent focal neuro deficit. PSYCH: Calm. Normal affect.   Consultants:  Pulmonology admitted patient  Procedures: None  Microbiology summarized: MRSA PCR screen negative Blood cultures NGTD  Assessment and plan: Septic shock 2/2 to LLE cellulitis: Present on admission.  Sepsis physiology resolved.  Culture data as above.  MRI with severe soft tissue edema concerning for cellulitis and/or venous insufficiency but no osteomyelitis.  Blood cultures NGTD.  Leukocytosis resolved. - Cefepime  and Vanco 4/24>> IV Ancef  4/28>>> - Continue wound care  Hx relative adrenal insufficiency (chronic prednisone  2/2 shoulder arthritis per pt):  -Now stable on home prednisone .  AKI: Likely ATN in the setting of septic shock.  Also on ibuprofen , losartan  and torsemide  at home.  Baseline 0.9-1.0.  AKI resolved.  Recent Labs    01/27/24 0922 02/03/24 1615 02/06/24 1320 02/07/24 0235 02/08/24 0430 02/08/24 1723 02/09/24 0500 02/10/24 0241 02/11/24 0240 02/12/24 0238  BUN 26* 26* 31* 41* 60* 66* 64* 56* 39* 31*  CREATININE 0.86 0.99 1.89* 2.77* 3.19* 2.93* 2.27* 1.60* 1.04 1.00  -Resume home torsemide  at low-dose -Continue monitoring -Continue holding losartan  and eplerenone . -Advised to avoid NSAID.  Chronic HFrEF/NICM S/p BiV ICD/pacemaker: TTE in 10/2023 with LVEF of 40 to 45%, indeterminate DD, normal PASP.  On torsemide  and losartan  at home.  Appears euvolemic on exam but difficult due to body habitus. -  Resume home torsemide  at reduced dose -Continue low-dose Coreg -started here. -Continue holding losartan  and eplerenone   Paroxysmal atrial fibrillation on Eliquis : Rate controlled - Continue Coreg .   - On Eliquis  for anticoagulation - Optimize electrolytes  Chronic COPD/ chronic hypoxic respiratory failure: On 2 L - Continue home breathing treatments  OSA intolerant to CPAP - Continue home oxygen   Essential hypertension: Recorded blood pressure as high  as 252/260.  Manual BP 110/60 on my check.  Patient is not symptomatic - Continue low-dose Coreg  - Continue holding losartan   - Notified RN and charge RN to check BP manually  Chronic pain prior cervical spine fx 05/2023/ contraction of neck - Resumed home tramadol , Soma  and gabapentin  at reduced dose - Discussed risk of polypharmacy  Anxiety/ depression/insomnia - Continue home meds.  Discussed risk of polypharmacy.  Chronic Debility: Limited mobility.  - Therapy recommended SNF but patient prefers to go home.  HH and DME ordered.  Polypharmacy: Takes about 6 tablets of Tylenol  PM at night in addition to Soma , gabapentin , tramadol , Ambien  and Xanax .  Extensive discussion about polypharmacy.  - Will continue home tramadol , Soma  and Ambien  at reduced dose. - Continue home gabapentin    Morbid obesity Body mass index is 39.29 kg/m.          DVT prophylaxis:   apixaban  (ELIQUIS ) tablet 5 mg  Code Status: Full code Family Communication: None at bedside Level of care: Telemetry Cardiac Status is: Inpatient Remains inpatient appropriate because: SNF placement   Final disposition: SNF   55 minutes with more than 50% spent in reviewing records, counseling patient/family and coordinating care.   Sch Meds:  Scheduled Meds:  acetaminophen   1,000 mg Oral Q6H WA   apixaban   5 mg Oral BID   arformoterol   15 mcg Nebulization BID   budesonide  (PULMICORT ) nebulizer solution  0.5 mg Nebulization BID   carisoprodol   350 mg Oral TID   carvedilol   3.125 mg Oral BID WC   Chlorhexidine  Gluconate Cloth  6 each Topical Daily   DULoxetine   60 mg Oral Daily   feeding supplement  237 mL Oral BID BM   gabapentin   100 mg Oral TID   leptospermum manuka honey  1 Application Topical Daily   pantoprazole   40 mg Oral Daily   predniSONE   10 mg Oral Q breakfast   QUEtiapine  25 mg Oral QHS   revefenacin   175 mcg Nebulization Daily   tamsulosin   0.4 mg Oral Daily   torsemide   20 mg Oral Daily    venlafaxine  XR  150 mg Oral Q breakfast   Continuous Infusions:   ceFAZolin  (ANCEF ) IV 2 g (02/12/24 0635)   PRN Meds:.albuterol , ALPRAZolam , calcium  carbonate, naphazoline-glycerin , ondansetron  (ZOFRAN ) IV, mouth rinse, traMADol   Antimicrobials: Anti-infectives (From admission, onward)    Start     Dose/Rate Route Frequency Ordered Stop   02/12/24 0000  cefadroxil  (DURICEF) 500 MG capsule        1,000 mg Oral 2 times daily 02/12/24 0755 02/17/24 2359   02/10/24 0845  ceFAZolin  (ANCEF ) IVPB 2g/100 mL premix        2 g 200 mL/hr over 30 Minutes Intravenous Every 8 hours 02/10/24 0753 02/13/24 0559   02/09/24 0815  vancomycin  (VANCOCIN ) IVPB 1000 mg/200 mL premix        1,000 mg 200 mL/hr over 60 Minutes Intravenous  Once 02/09/24 0721 02/09/24 1011   02/08/24 1228  vancomycin  variable dose per unstable renal function (pharmacist dosing)  Status:  Discontinued  Does not apply See admin instructions 02/08/24 1228 02/10/24 0738   02/07/24 2000  vancomycin  (VANCOCIN ) IVPB 1000 mg/200 mL premix  Status:  Discontinued        1,000 mg 200 mL/hr over 60 Minutes Intravenous Every 24 hours 02/07/24 0915 02/08/24 1227   02/07/24 0800  ceFEPIme  (MAXIPIME ) 2 g in sodium chloride  0.9 % 100 mL IVPB  Status:  Discontinued        2 g 200 mL/hr over 30 Minutes Intravenous Every 12 hours 02/06/24 2116 02/10/24 0738   02/06/24 1945  ceFEPIme  (MAXIPIME ) 2 g in sodium chloride  0.9 % 100 mL IVPB        2 g 200 mL/hr over 30 Minutes Intravenous  Once 02/06/24 1936 02/06/24 2040   02/06/24 1945  vancomycin  (VANCOREADY) IVPB 2000 mg/400 mL        2,000 mg 200 mL/hr over 120 Minutes Intravenous  Once 02/06/24 1936 02/06/24 2257        I have personally reviewed the following labs and images: CBC: Recent Labs  Lab 02/06/24 1320 02/07/24 0235 02/08/24 0430 02/09/24 0500 02/09/24 1423 02/10/24 0241 02/11/24 0240 02/12/24 0238  WBC 20.8*   < > 22.7* 15.3*  --  11.7* 9.5 12.1*  NEUTROABS  20.0*  --   --   --   --   --   --   --   HGB 10.2*   < > 9.6* 9.1* 10.2* 9.3* 9.6* 10.0*  HCT 36.3*   < > 30.3* 28.7* 30.0* 29.7* 31.1* 32.6*  MCV 107.7*   < > 95.3 94.4  --  95.2 95.7 96.4  PLT 233   < > 286 260  --  274 274 297   < > = values in this interval not displayed.   BMP &GFR Recent Labs  Lab 02/07/24 0235 02/08/24 0219 02/08/24 0430 02/08/24 1723 02/09/24 0500 02/09/24 1423 02/10/24 0241 02/11/24 0240 02/12/24 0238  NA 135   < > 129* 129* 131* 130* 135 137 137  K 4.8   < > 4.6 4.4 4.1 4.4 4.7 4.4 4.9  CL 97*  --  93* 92* 94*  --  102 104 102  CO2 24  --  22 23 23   --  24 25 27   GLUCOSE 66*  --  119* 114* 97  --  91 109* 78  BUN 41*  --  60* 66* 64*  --  56* 39* 31*  CREATININE 2.77*  --  3.19* 2.93* 2.27*  --  1.60* 1.04 1.00  CALCIUM  8.3*  --  7.7* 8.1* 8.4*  --  9.0 8.8* 8.7*  MG 1.5*  --  2.2  --   --   --   --  2.4 1.9  PHOS 6.2*  --   --   --   --   --   --  2.7 2.2*   < > = values in this interval not displayed.   Estimated Creatinine Clearance: 92.2 mL/min (by C-G formula based on SCr of 1 mg/dL). Liver & Pancreas: Recent Labs  Lab 02/06/24 1320 02/11/24 0240 02/12/24 0238  AST 25  --   --   ALT 28  --   --   ALKPHOS 67  --   --   BILITOT 0.8  --   --   PROT 5.1*  --   --   ALBUMIN  2.8* 2.1* 2.3*   No results for input(s): "LIPASE", "AMYLASE" in the last 168 hours. No results for input(s): "  AMMONIA" in the last 168 hours. Diabetic: No results for input(s): "HGBA1C" in the last 72 hours. Recent Labs  Lab 02/11/24 1612 02/11/24 2006 02/12/24 0005 02/12/24 0359 02/12/24 0524  GLUCAP 125* 102* 79 66* 89   Cardiac Enzymes: No results for input(s): "CKTOTAL", "CKMB", "CKMBINDEX", "TROPONINI" in the last 168 hours. No results for input(s): "PROBNP" in the last 8760 hours. Coagulation Profile: No results for input(s): "INR", "PROTIME" in the last 168 hours. Thyroid  Function Tests: No results for input(s): "TSH", "T4TOTAL", "FREET4",  "T3FREE", "THYROIDAB" in the last 72 hours. Lipid Profile: No results for input(s): "CHOL", "HDL", "LDLCALC", "TRIG", "CHOLHDL", "LDLDIRECT" in the last 72 hours. Anemia Panel: No results for input(s): "VITAMINB12", "FOLATE", "FERRITIN", "TIBC", "IRON", "RETICCTPCT" in the last 72 hours. Urine analysis:    Component Value Date/Time   COLORURINE AMBER (A) 02/08/2024 0430   APPEARANCEUR HAZY (A) 02/08/2024 0430   LABSPEC 1.024 02/08/2024 0430   PHURINE 5.0 02/08/2024 0430   GLUCOSEU NEGATIVE 02/08/2024 0430   GLUCOSEU NEGATIVE 01/07/2024 1546   HGBUR SMALL (A) 02/08/2024 0430   BILIRUBINUR NEGATIVE 02/08/2024 0430   KETONESUR 5 (A) 02/08/2024 0430   PROTEINUR 30 (A) 02/08/2024 0430   UROBILINOGEN 0.2 01/07/2024 1546   NITRITE NEGATIVE 02/08/2024 0430   LEUKOCYTESUR TRACE (A) 02/08/2024 0430   Sepsis Labs: Invalid input(s): "PROCALCITONIN", "LACTICIDVEN"  Microbiology: Recent Results (from the past 240 hours)  Blood culture (routine x 2)     Status: None   Collection Time: 02/06/24  8:05 PM   Specimen: BLOOD RIGHT FOREARM  Result Value Ref Range Status   Specimen Description BLOOD RIGHT FOREARM  Final   Special Requests   Final    BOTTLES DRAWN AEROBIC AND ANAEROBIC Blood Culture results may not be optimal due to an inadequate volume of blood received in culture bottles   Culture   Final    NO GROWTH 5 DAYS Performed at [facility], [address] [place], [place] [address]    Report Status 02/11/2024 FINAL  Final  Blood culture (routine x 2)     Status: None   Collection Time: 02/06/24  8:15 PM   Specimen: BLOOD LEFT ARM  Result Value Ref Range Status   Specimen Description BLOOD LEFT ARM  Final   Special Requests   Final    BOTTLES DRAWN AEROBIC AND ANAEROBIC Blood Culture adequate volume   Culture   Final    NO GROWTH 5 DAYS Performed at [facility], [address] [place], [place] [address]    Report Status 02/11/2024 FINAL  Final  MRSA Next Gen by  PCR, Nasal     Status: None   Collection Time: 02/06/24 10:25 PM   Specimen: Nasal Mucosa; Nasal Swab  Result Value Ref Range Status   MRSA by PCR Next Gen NOT DETECTED NOT DETECTED Final    Comment: (NOTE) The GeneXpert MRSA Assay (FDA approved for NASAL specimens only), is one component of a comprehensive MRSA colonization surveillance program. It is not intended to diagnose MRSA infection nor to guide or monitor treatment for MRSA infections. Test performance is not FDA approved in patients less than 12 years old. Performed at [facility], [address], [place], [place] [address]     Radiology Studies: No results found.    Tyshan Enderle T. Leona Pressly [facility]  If 7PM-7AM, please contact night-coverage [email or web address] 02/12/2024, 12:45 PM

## 2024-02-12 NOTE — Telephone Encounter (Signed)
 Furoscix  was denied by [facility] due to failure to try all generic meds (furosemide , torsemide , bumex)  [facility] [phone]) and started standard appeal (7 day turn around), pt has tried and failed furosemide  and torsemide , case ID: [id number]

## 2024-02-12 NOTE — Plan of Care (Signed)
  Problem: Health Behavior/Discharge Planning: Goal: Ability to manage health-related needs will improve Outcome: Progressing   Problem: Nutritional: Goal: Maintenance of adequate nutrition will improve Outcome: Progressing   Problem: Education: Goal: Knowledge of General Education information will improve Description: Including pain rating scale, medication(s)/side effects and non-pharmacologic comfort measures Outcome: Progressing   Problem: Coping: Goal: Level of anxiety will decrease Outcome: Progressing

## 2024-02-12 NOTE — Care Management Important Message (Signed)
 Important Message  Patient Details  Name: [name] MRN: [id number] Date of Birth: [birth year]   Important Message Given:  Yes - Medicare IM     Janith Melnick 02/12/2024, 10:30 AM

## 2024-02-12 NOTE — Plan of Care (Signed)
  Problem: Education: Goal: Ability to describe self-care measures that may prevent or decrease complications (Diabetes Survival Skills Education) will improve Outcome: Progressing   Problem: Education: Goal: Individualized Educational Video(s) Outcome: Progressing   Problem: Coping: Goal: Ability to adjust to condition or change in health will improve Outcome: Progressing   Problem: Fluid Volume: Goal: Ability to maintain a balanced intake and output will improve Outcome: Progressing   Problem: Health Behavior/Discharge Planning: Goal: Ability to identify and utilize available resources and services will improve Outcome: Progressing

## 2024-02-12 NOTE — Progress Notes (Signed)
 Patient's CBG at 4AM was 66. Hypoglycemia for Adults Standing Orders started. MD notified. Patient had 2 packets of graham crackers and 4oz of regular soda. Post CBG is 89.

## 2024-02-12 NOTE — Progress Notes (Signed)
 Occupational Therapy Treatment Patient Details Name: [name] MRN: [id number] DOB: November 28, 1955 Today's Date: 02/12/2024   History of present illness 68 year old male admitted 02/07/24 with weakness, AKI, hypotension, LLE cellulitis. PMHx recent admit for sepsis 4/11-4/14 due to viral gastroenteritis. COPD on 2L O2, asthma overlap syndrome, OSA, T2DM, HTN, A-fib, cardiomyopathy EF 40%, s/p BiV ICD, torticollis, cervical fx, neuropathy   OT comments  Pt motivated to perform OOB activities, mobility specialist assisted with session. Assisted with bed level toileting hygiene, total A. Pt able to roll L/R with mod-max A using rails. Pt able to get in/out of bed with mod-max A for bed mobility using rails. Once sitting EOB, fair sitting balance. Pt used Stedy to complete multiple stands with min-mod A, up to 20 seconds each stand, able to slowly control sitting each time. Pt would benefit greatly from continued acute OT to maximize strength and activity tolerance to improve functional independence, DC to postacute rehab <3hrs/day still appropriate.       If plan is discharge home, recommend the following:  A lot of help with walking and/or transfers;Two people to help with bathing/dressing/bathroom;Assistance with cooking/housework;Direct supervision/assist for medications management;Direct supervision/assist for financial management;Assist for transportation;Help with stairs or ramp for entrance   Equipment Recommendations  Wheelchair (measurements OT);Wheelchair cushion (measurements OT);Hospital bed    Recommendations for Other Services      Precautions / Restrictions Precautions Precautions: Fall;Other (comment) Recall of Precautions/Restrictions: Impaired Precaution/Restrictions Comments: monitor BP and O2, Restrictions Weight Bearing Restrictions Per Provider Order: No       Mobility Bed Mobility Overal bed mobility: Needs Assistance Bed Mobility: Supine to Sit, Sit to Supine      Supine to sit: Max assist, +2 for physical assistance Sit to supine: +2 for physical assistance, Max assist   General bed mobility comments: max A +2 for all aspects, pt reporting that he normally sleeps in his recliner    Transfers Overall transfer level: Needs assistance Equipment used: Ambulation equipment used Transfers: Sit to/from Stand Sit to Stand: Via lift equipment           General transfer comment: able to stand with Stedy min-mod A, stands for 20 seconds before needing to sit. Able to control sitting well. Transfer via Lift Equipment: Stedy   Balance Overall balance assessment: Needs assistance Sitting-balance support: Feet supported, Bilateral upper extremity supported Sitting balance-Leahy Scale: Fair Sitting balance - Comments: sits without support at EOB   Standing balance support: Bilateral upper extremity supported, During functional activity, Reliant on assistive device for balance Standing balance-Leahy Scale: Poor Standing balance comment: reliant on Stedy/RW for support                           ADL either performed or assessed with clinical judgement   ADL Overall ADL's : Needs assistance/impaired Eating/Feeding: Set up;Sitting                           Toileting- Clothing Manipulation and Hygiene: Total assistance;Bed level         General ADL Comments: Pt requires bed level rolling L/R for toileting hygiene total A. Pt stands with steady with min-mod A    Extremity/Trunk Assessment Upper Extremity Assessment Upper Extremity Assessment: Generalized weakness;RUE deficits/detail;LUE deficits/detail RUE Deficits / Details: gross shoulder strength 4-/5; gross strength otherwise 4+ to 5/5; ROM, coordinaiton, and sensation WFL RUE Coordination: WNL LUE Deficits / Details: hand strength 5/5,  otherwise gross strength 3 to 4-/5; decreased shoulder AROM/PROM to approx 90 degrees at baseline, AROM otherwise WFL; pt reporting  increased pain in shoulder with AROM this day; decreased gross motor coordinaiton; fine motor coordination and sensation WFL LUE Coordination: decreased gross motor            Vision       Perception     Praxis     Communication Communication Communication: No apparent difficulties   Cognition Arousal: Alert Behavior During Therapy: WFL for tasks assessed/performed Cognition: No apparent impairments             OT - Cognition Comments: Pt able to fully participate, good safety awareness, understands situation and deficits, responds appropriately.                 Following commands: Intact        Cueing   Cueing Techniques: Verbal cues  Exercises      Shoulder Instructions       General Comments      Pertinent Vitals/ Pain       Pain Assessment Pain Assessment: Faces Faces Pain Scale: Hurts even more Pain Location: BLE, back Pain Descriptors / Indicators: Aching, Sore, Grimacing Pain Intervention(s): Monitored during session  Home Living                                          Prior Functioning/Environment              Frequency  Min 2X/week        Progress Toward Goals  OT Goals(current goals can now be found in the care plan section)  Progress towards OT goals: Progressing toward goals  Acute Rehab OT Goals Patient Stated Goal: to improve strength OT Goal Formulation: With patient Time For Goal Achievement: 02/23/24 Potential to Achieve Goals: Good ADL Goals Pt Will Perform Lower Body Bathing: with supervision;sitting/lateral leans;sit to/from stand Pt Will Perform Upper Body Dressing: with min assist;sitting Pt Will Perform Lower Body Dressing: with mod assist;sitting/lateral leans;sit to/from stand Pt Will Transfer to Toilet: with min assist;squat pivot transfer;stand pivot transfer;bedside commode Pt Will Perform Toileting - Clothing Manipulation and hygiene: with supervision;sitting/lateral leans;sit  to/from stand Additional ADL Goal #1: Patient will demonstrate ability to Independently state 3 energy conservation strategies to increase safety and independence with functional tasks with handout provided. Additional ADL Goal #2: Patient will demonstrate understanding through teach back of education in fall prevention strategies, including monitoring O2 sat and BP in the home, to increase safety and independence with functional tasks and improve management of chronic health conditions.  Plan      Co-evaluation                 AM-PAC OT "6 Clicks" Daily Activity     Outcome Measure   Help from another person eating meals?: A Little Help from another person taking care of personal grooming?: A Little Help from another person toileting, which includes using toliet, bedpan, or urinal?: Total Help from another person bathing (including washing, rinsing, drying)?: A Lot Help from another person to put on and taking off regular upper body clothing?: A Lot Help from another person to put on and taking off regular lower body clothing?: A Lot 6 Click Score: 13    End of Session Equipment Utilized During Treatment: Oxygen   OT Visit Diagnosis: Unsteadiness on feet (R26.81);Other  abnormalities of gait and mobility (R26.89);Muscle weakness (generalized) (M62.81);Other (comment)   Activity Tolerance Patient tolerated treatment well   Patient Left in bed;with call bell/phone within reach   Nurse Communication Mobility status        Time: 1610-9604 OT Time Calculation (min): 48 min  Charges: OT General Charges $OT Visit: 1 Visit OT Treatments $Self Care/Home Management : 23-37 mins $Therapeutic Activity: 8-22 mins  Lamarr Feenstra, OTR/L   Scherry Curtis 02/12/2024, 5:01 PM

## 2024-02-13 ENCOUNTER — Other Ambulatory Visit (HOSPITAL_COMMUNITY): Payer: Self-pay

## 2024-02-13 DIAGNOSIS — F419 Anxiety disorder, unspecified: Secondary | ICD-10-CM | POA: Diagnosis not present

## 2024-02-13 DIAGNOSIS — R531 Weakness: Secondary | ICD-10-CM | POA: Diagnosis not present

## 2024-02-13 DIAGNOSIS — L03116 Cellulitis of left lower limb: Secondary | ICD-10-CM | POA: Diagnosis not present

## 2024-02-13 DIAGNOSIS — G243 Spasmodic torticollis: Secondary | ICD-10-CM | POA: Diagnosis not present

## 2024-02-13 DIAGNOSIS — K219 Gastro-esophageal reflux disease without esophagitis: Secondary | ICD-10-CM | POA: Diagnosis not present

## 2024-02-13 DIAGNOSIS — J449 Chronic obstructive pulmonary disease, unspecified: Secondary | ICD-10-CM | POA: Diagnosis not present

## 2024-02-13 DIAGNOSIS — A419 Sepsis, unspecified organism: Secondary | ICD-10-CM | POA: Diagnosis not present

## 2024-02-13 DIAGNOSIS — I429 Cardiomyopathy, unspecified: Secondary | ICD-10-CM

## 2024-02-13 DIAGNOSIS — Z95810 Presence of automatic (implantable) cardiac defibrillator: Secondary | ICD-10-CM

## 2024-02-13 DIAGNOSIS — G4730 Sleep apnea, unspecified: Secondary | ICD-10-CM | POA: Diagnosis not present

## 2024-02-13 DIAGNOSIS — J438 Other emphysema: Secondary | ICD-10-CM

## 2024-02-13 DIAGNOSIS — S71002A Unspecified open wound, left hip, initial encounter: Secondary | ICD-10-CM | POA: Diagnosis not present

## 2024-02-13 DIAGNOSIS — R1311 Dysphagia, oral phase: Secondary | ICD-10-CM | POA: Diagnosis not present

## 2024-02-13 DIAGNOSIS — G4733 Obstructive sleep apnea (adult) (pediatric): Secondary | ICD-10-CM | POA: Diagnosis not present

## 2024-02-13 DIAGNOSIS — I5020 Unspecified systolic (congestive) heart failure: Secondary | ICD-10-CM | POA: Diagnosis not present

## 2024-02-13 DIAGNOSIS — F32A Depression, unspecified: Secondary | ICD-10-CM | POA: Diagnosis not present

## 2024-02-13 DIAGNOSIS — F411 Generalized anxiety disorder: Secondary | ICD-10-CM | POA: Diagnosis not present

## 2024-02-13 DIAGNOSIS — R1314 Dysphagia, pharyngoesophageal phase: Secondary | ICD-10-CM | POA: Diagnosis not present

## 2024-02-13 DIAGNOSIS — I83028 Varicose veins of left lower extremity with ulcer other part of lower leg: Secondary | ICD-10-CM | POA: Diagnosis not present

## 2024-02-13 DIAGNOSIS — I480 Paroxysmal atrial fibrillation: Secondary | ICD-10-CM | POA: Diagnosis not present

## 2024-02-13 DIAGNOSIS — F5101 Primary insomnia: Secondary | ICD-10-CM | POA: Diagnosis not present

## 2024-02-13 DIAGNOSIS — Z7401 Bed confinement status: Secondary | ICD-10-CM | POA: Diagnosis not present

## 2024-02-13 DIAGNOSIS — F4320 Adjustment disorder, unspecified: Secondary | ICD-10-CM | POA: Diagnosis not present

## 2024-02-13 DIAGNOSIS — G4700 Insomnia, unspecified: Secondary | ICD-10-CM | POA: Diagnosis not present

## 2024-02-13 DIAGNOSIS — R41841 Cognitive communication deficit: Secondary | ICD-10-CM | POA: Diagnosis not present

## 2024-02-13 DIAGNOSIS — R2681 Unsteadiness on feet: Secondary | ICD-10-CM | POA: Diagnosis not present

## 2024-02-13 DIAGNOSIS — G894 Chronic pain syndrome: Secondary | ICD-10-CM

## 2024-02-13 DIAGNOSIS — L89890 Pressure ulcer of other site, unstageable: Secondary | ICD-10-CM | POA: Diagnosis not present

## 2024-02-13 DIAGNOSIS — L89893 Pressure ulcer of other site, stage 3: Secondary | ICD-10-CM | POA: Diagnosis not present

## 2024-02-13 DIAGNOSIS — E119 Type 2 diabetes mellitus without complications: Secondary | ICD-10-CM | POA: Diagnosis not present

## 2024-02-13 DIAGNOSIS — M6259 Muscle wasting and atrophy, not elsewhere classified, multiple sites: Secondary | ICD-10-CM | POA: Diagnosis not present

## 2024-02-13 DIAGNOSIS — M6281 Muscle weakness (generalized): Secondary | ICD-10-CM | POA: Diagnosis not present

## 2024-02-13 DIAGNOSIS — I255 Ischemic cardiomyopathy: Secondary | ICD-10-CM | POA: Diagnosis not present

## 2024-02-13 LAB — MAGNESIUM: Magnesium: 1.6 mg/dL — ABNORMAL LOW (ref 1.7–2.4)

## 2024-02-13 LAB — RENAL FUNCTION PANEL
Albumin: 2.2 g/dL — ABNORMAL LOW (ref 3.5–5.0)
Anion gap: 10 (ref 5–15)
BUN: 23 mg/dL (ref 8–23)
CO2: 28 mmol/L (ref 22–32)
Calcium: 8.6 mg/dL — ABNORMAL LOW (ref 8.9–10.3)
Chloride: 101 mmol/L (ref 98–111)
Creatinine, Ser: 0.97 mg/dL (ref 0.61–1.24)
GFR, Estimated: >60 mL/min (ref >60)
Glucose, Bld: 95 mg/dL (ref 70–99)
Phosphorus: 2.7 mg/dL (ref 2.5–4.6)
Potassium: 5 mmol/L (ref 3.5–5.1)
Sodium: 139 mmol/L (ref 135–145)

## 2024-02-13 LAB — CBC
HCT: 32.3 % — ABNORMAL LOW (ref 39.0–52.0)
Hemoglobin: 9.9 g/dL — ABNORMAL LOW (ref 13.0–17.0)
MCH: 29.3 pg (ref 26.0–34.0)
MCHC: 30.7 g/dL (ref 30.0–36.0)
MCV: 95.6 fL (ref 80.0–100.0)
Platelets: 298 10*3/uL (ref 150–400)
RBC: 3.38 MIL/uL — ABNORMAL LOW (ref 4.22–5.81)
RDW: 14.8 % (ref 11.5–15.5)
WBC: 11.5 10*3/uL — ABNORMAL HIGH (ref 4.0–10.5)
nRBC: 0 % (ref 0.0–0.2)

## 2024-02-13 LAB — GLUCOSE, CAPILLARY
Glucose-Capillary: 85 mg/dL (ref 70–99)
Glucose-Capillary: 98 mg/dL (ref 70–99)
Glucose-Capillary: 136 mg/dL — ABNORMAL HIGH (ref 70–99)

## 2024-02-13 MED ORDER — ALPRAZOLAM 0.5 MG PO TABS: 0.5000 mg | ORAL_TABLET | Freq: Every day | ORAL | 0 refills | Status: DC

## 2024-02-13 MED ORDER — TORSEMIDE 20 MG PO TABS: 40.0000 mg | ORAL_TABLET | Freq: Every day | ORAL | Status: DC

## 2024-02-13 MED ORDER — CARISOPRODOL 350 MG PO TABS: 350.0000 mg | ORAL_TABLET | Freq: Four times a day (QID) | ORAL | 0 refills | Status: DC | PRN

## 2024-02-13 MED ORDER — TRAMADOL HCL 50 MG PO TABS: 50.0000 mg | ORAL_TABLET | Freq: Three times a day (TID) | ORAL | 0 refills | Status: AC | PRN

## 2024-02-13 MED ORDER — MAGNESIUM SULFATE 2 GM/50ML IV SOLN
2.0000 g | Freq: Once | INTRAVENOUS | Status: AC
  Administered 2024-02-13: 2 g via INTRAVENOUS
  Filled 2024-02-13: qty 50

## 2024-02-13 MED ORDER — CEFADROXIL 500 MG PO CAPS
1000.0000 mg | ORAL_CAPSULE | Freq: Two times a day (BID) | ORAL | Status: AC
Start: 2024-02-13 — End: 2024-02-18

## 2024-02-13 MED ORDER — METOPROLOL TARTRATE 25 MG PO TABS: 12.5000 mg | ORAL_TABLET | Freq: Two times a day (BID) | ORAL | Status: DC

## 2024-02-13 NOTE — TOC Transition Note (Signed)
 Transition of Care [facility]) - Discharge Note   Patient Details  Name: [name] MRN: [id number] Date of Birth: 1956-05-12  Transition of Care [facility] CM/SW Contact:  Arron Big, LCSWA Phone Number: 02/13/2024, 8:52 AM   Clinical Narrative:   Patient will DC to: [facility] Anticipated DC date: 02/13/24  Family notified: [name] (son) Transport by: Lyna Sandhoff   Per MD patient ready for DC to [facility]. RN to call report prior to discharge [phone]; room 212 ). RN, patient, patient's family, and facility notified of DC. Discharge Summary and FL2 sent to facility. DC packet on chart. Ambulance transport requested for patient at 12:43 PM.   CSW will sign off for now as social work intervention is no longer needed. Please consult us  again if new needs arise.      Final next level of care: Skilled Nursing Facility Barriers to Discharge: Barriers Resolved   Patient Goals and CMS Choice Patient states their goals for this hospitalization and ongoing recovery are:: To get better          Discharge Placement              Patient chooses bed at: [facility] Patient to be transferred to facility by: [facility] Name of family member notified: [name] Patient and family notified of of transfer: 02/13/24  Discharge Plan and Services Additional resources added to the After Visit Summary for   In-house Referral: Clinical Social Work                                   Social Drivers of Health (SDOH) Interventions SDOH Screenings   Food Insecurity: No Food Insecurity (02/09/2024)  Housing: High Risk (02/09/2024)  Transportation Needs: No Transportation Needs (02/09/2024)  Utilities: Not At Risk (02/09/2024)  Alcohol Screen: Low Risk  (09/16/2023)  Depression (PHQ2-9): Low Risk  (01/28/2024)  Financial Resource Strain: Medium Risk (01/01/2024)  Physical Activity: Inactive (01/01/2024)  Social Connections: Socially Isolated (02/09/2024)  Stress: No Stress Concern  Present (01/01/2024)  Tobacco Use: Medium Risk (02/06/2024)     Readmission Risk Interventions    02/07/2024    2:21 PM 11/22/2022    1:34 PM  Readmission Risk Prevention Plan  Post Dischage Appt  Complete  Medication Screening  Complete  Transportation Screening Complete Complete  PCP or Specialist Appt within 3-5 Days Complete   HRI or Home Care Consult Complete   Social Work Consult for Recovery Care Planning/Counseling Complete   Palliative Care Screening Not Applicable   Medication Review Oceanographer) Referral to Pharmacy

## 2024-02-13 NOTE — Plan of Care (Signed)
  Problem: Skin Integrity: Goal: Risk for impaired skin integrity will decrease Outcome: Progressing   Problem: Education: Goal: Knowledge of General Education information will improve Description: Including pain rating scale, medication(s)/side effects and non-pharmacologic comfort measures Outcome: Progressing   Problem: Clinical Measurements: Goal: Diagnostic test results will improve Outcome: Progressing   Problem: Clinical Measurements: Goal: Respiratory complications will improve Outcome: Progressing   Problem: Clinical Measurements: Goal: Cardiovascular complication will be avoided Outcome: Progressing

## 2024-02-13 NOTE — Discharge Summary (Signed)
 Physician Discharge Summary  [name]:[id number] DOB: August 14, 1956 DOA: 02/06/2024  PCP: Roslyn Coombe, MD  Admit date: 02/06/2024 Discharge date: 02/13/24  Admitted From: Home Disposition: SNF Recommendations for Outpatient Follow-up:  Reassess fluid status, blood pressure, CMP and CBC in 1 week Please follow up on the following pending results: None   Discharge Condition: Stable CODE STATUS: Full code  Contact information for follow-up providers     Roslyn Coombe, MD. Schedule an appointment as soon as possible for a visit in 1 week(s).   Specialties: Internal Medicine, Radiology Contact information: [address] [place] [address] [phone]              Contact information for after-discharge care     Destination     [facility] [place] [facility] .   Service: Skilled Nursing Contact information: [address] [place]  [phone]                     Hospital course 68 year old M with PMH of COPD, OSA not on CPAP, chronic hypoxic RF on 2 L, systolic CHF s/p BiV ICD, A-fib on Eliquis , morbid obesity, anxiety, depression, adrenal insufficiency due to chronic steroid and recent hospitalization due to septic shock in the setting of viral gastroenteritis, diarrhea, syncopal episode and AKI, returning with generalized weakness, and admitted to ICU due to septic shock in the setting of LLE cellulitis and AKI.  In ED, hypotensive.  Cr 1.9 (from 1.03 days prior).  WBC 20.8.  Troponin 21.  LLE noted to be warm, red and painful.  Cultures obtained.  Patient was started on broad-spectrum antibiotics and vasopressor and admitted to ICU.   Patient came off vasopressor on 4/25.  AKI improved.  Transferred out of ICU on 4/28. Antibiotics de-escalated to IV Ancef .  AKI resolved.  Discharged on p.o. cefadroxil  for 5 more days to complete treatment course for infection.  Torsemide  resumed at 20 mg daily and  increased to 40 mg daily.  Avoid NSAID.  Patient at risk for polypharmacy on multiple sedating medications.  We have discontinued Atarax , Zanaflex , Tylenol  PM, doxepin , trazodone  and Ambien .  We continued home gabapentin , tramadol , Soma  and Xanax .  Recommend further review  See individual problem list below for more.   Problems addressed during this hospitalization Septic shock 2/2 to LLE cellulitis: Present on admission.  Sepsis physiology resolved.  Culture data as above.  MRI with severe soft tissue edema concerning for cellulitis and/or venous insufficiency but no osteomyelitis.  Blood cultures NGTD.  Leukocytosis resolved. - Cefepime  and Vanco 4/24>> IV Ancef  4/28>>> cefadroxil  5/1-5/6 - Continue wound care as below   Hx relative adrenal insufficiency (chronic prednisone  2/2 shoulder arthritis per pt):  -Now stable on home prednisone  10 mg daily.   AKI: Likely ATN in the setting of septic shock.  Also on ibuprofen , losartan , eplerenone  and high-dose torsemide  at home.  Baseline 0.9-1.0.  AKI resolved.  Recent Labs    02/03/24 1615 02/06/24 1320 02/07/24 0235 02/08/24 0430 02/08/24 1723 02/09/24 0500 02/10/24 0241 02/11/24 0240 02/12/24 0238 02/13/24 0244  BUN 26* 31* 41* 60* 66* 64* 56* 39* 31* 23  CREATININE 0.99 1.89* 2.77* 3.19* 2.93* 2.27* 1.60* 1.04 1.00 0.97  - Discontinued ibuprofen , losartan  and eplerenone . - Decrease torsemide  from 100 mg twice daily to 40 mg daily.  Had excellent UOP even on 20 mg daily    Chronic HFrEF/NICM S/p BiV ICD/pacemaker: TTE in 10/2023 with  LVEF of 40 to 45%, indeterminate DD, normal PASP.  On torsemide  and losartan  at home.  Appears euvolemic on exam but difficult due to body habitus. -Torsemide  as above. -Continue low-dose Coreg -started here. -Discontinue losartan  and eplerenone .  High risk for AKI and hyperkalemia.   Paroxysmal atrial fibrillation on Eliquis : Rate controlled - Continue Coreg  and Eliquis .     OSA intolerant to  CPAP/chronic COPD chronic hypoxic RF-on 2 L at baseline. - Continue home breathing treatments and oxygen    Essential hypertension: Normotensive on low-dose Coreg  and torsemide  here. - Continue low-dose Coreg  and torsemide    Chronic pain prior cervical spine fx 05/2023/ contraction of neck - Continue home tramadol , Soma  and gabapentin  at reduced dose - Discussed risk of polypharmacy   Anxiety/ depression/insomnia - Continue home Xanax  at bedtime.  Discussed risk of polypharmacy.   Chronic Debility: Limited mobility.  - Therapy recommended SNF.    Polypharmacy: Takes about 6 tablets of Tylenol  PM at night in addition to Soma , Zanaflex , Tylenol  PM, gabapentin , tramadol , Ambien , doxepin , trazodone  and Xanax .  Extensive discussion about polypharmacy.  - Continue tramadol , Soma  and and Xanax  - Discontinue trazodone , Zanaflex , Ambien  and doxepin     Morbid obesity Body mass index is 38.75 kg/m. - Encourage lifestyle change to lose weight - Continue home Mounjaro            Time spent 35 minutes  Vital signs Vitals:   02/12/24 1935 02/12/24 1950 02/12/24 2330 02/13/24 0444  BP: 124/72  109/74 124/67  Pulse: 70  69 71  Temp: 98.3 F (36.8 C)  98.3 F (36.8 C) 97.9 F (36.6 C)  Resp: 19  19 19   Height:      Weight:    122.5 kg  SpO2: 98% 98% 99% 100%  TempSrc: Oral  Oral Oral  BMI (Calculated):    38.75     Discharge exam  GENERAL: No apparent distress.  Nontoxic. HEENT: MMM.  Vision and hearing grossly intact.  NECK: No apparent JVD.  Chronic cervical contracture. RESP:  No IWOB.  Fair aeration bilaterally but limited exam due to body habitus. CVS:  RRR. Heart sounds normal.  ABD/GI/GU: BS+. Abd soft, NTND.  MSK/EXT:  Moves extremities. No apparent deformity. No edema.  SKIN: Left shin ulcer appears clean.  New Ace wrap over BLE. NEURO: Awake, alert and oriented appropriately.  No apparent focal neuro deficit. PSYCH: Calm. Normal affect.   Discharge  Instructions Discharge Instructions     Diet - low sodium heart healthy   Complete by: As directed    Discharge instructions   Complete by: As directed    It has been a pleasure taking care of you!  You were hospitalized due to leg infection and kidney injury for which you have been treated.  We are discharging you on more antibiotics to complete treatment course for infection.  Your kidney function has recovered.  We made adjustment to your home medication during this hospitalization.  Please review your new medication list and the directions on your medications before you take them.  Follow-up with your primary care doctor in 1 to 2 weeks or sooner if needed.  Note that combination of Xanax , gabapentin , Soma , trazodone , Tylenol  PM, Zanaflex , tramadol  and Ambien  can increase your risk of drowsiness, sedation,  impaired judgment, respiratory depression that could potentially lead to death and impaired balance that could increase risk of fall.  We have stopped some of these medications.  We strongly recommend talking to your doctors about the rest  of this medications. We do not recommend driving, operating machinery or other activity that requires similar mental and physical engagement.     Take care,   Discharge wound care:   Complete by: As directed    Wound care  Daily      Comments: Cleanse L leg wounds with Vashe wound cleanser Timm Foot [phone]) do not rinse and allow to air dry.  Apply Xeroform gauze (Lawson [phone]) to serum filled blister (distal to knee).  Apply Medihoney to necrotic wound bed lower leg daily, cover with dry gauze and ABD pad.  Secure with Kerlix roll gauze anchored around foot and ending right below knee.   Increase activity slowly   Complete by: As directed       Allergies as of 02/13/2024       Reactions   Tape Other (See Comments)   SKIN TEARS VERY, VERY EASILY!!!! Use Paper Tape Only, PLEASE!!!   Amiodarone  Other (See Comments)   Caused hyperthyroidism    Statins Other (See Comments)   Myalgias   Sulfa  Antibiotics Other (See Comments)   Mouth ulcers   Wound Dressing Adhesive Other (See Comments)   Skin Tears Use Paper Tape Only        Medication List     STOP taking these medications    arformoterol  15 MCG/2ML Nebu Commonly known as: BROVANA    clotrimazole -betamethasone  cream Commonly known as: LOTRISONE    doxepin  10 MG capsule Commonly known as: SINEQUAN    eplerenone  25 MG tablet Commonly known as: INSPRA    fluconazole  100 MG tablet Commonly known as: Diflucan    Furoscix  80 MG/10ML Ctkt Generic drug: Furosemide    hydrOXYzine  10 MG tablet Commonly known as: ATARAX    Ibuprofen  200 MG Caps   ketoconazole  2 % cream Commonly known as: NIZORAL    Lidoderm  5 % Generic drug: lidocaine    losartan  25 MG tablet Commonly known as: COZAAR    nystatin  powder Commonly known as: MYCOSTATIN /NYSTOP    potassium chloride  SA 20 MEQ tablet Commonly known as: Klor-Con  M20   silver  sulfADIAZINE  1 % cream Commonly known as: SILVADENE    tiZANidine  4 MG tablet Commonly known as: ZANAFLEX    traZODone  100 MG tablet Commonly known as: DESYREL    Tylenol  PM Extra Strength 500-25 MG Tabs tablet Generic drug: diphenhydramine -acetaminophen    valACYclovir  1000 MG tablet Commonly known as: Valtrex    zolpidem  10 MG tablet Commonly known as: AMBIEN        TAKE these medications    albuterol  108 (90 Base) MCG/ACT inhaler Commonly known as: VENTOLIN  HFA INHALE 1-2 PUFFS BY MOUTH EVERY 6 HOURS AS NEEDED FOR WHEEZE OR SHORTNESS OF BREATH What changed: Another medication with the same name was removed. Continue taking this medication, and follow the directions you see here.   ALPRAZolam  0.5 MG tablet Commonly known as: XANAX  Take 1 tablet (0.5 mg total) by mouth at bedtime. What changed: See the new instructions.   ammonium lactate  12 % cream Commonly known as: AMLACTIN Apply 1 Application topically as needed for dry skin.    apixaban  5 MG Tabs tablet Commonly known as: Eliquis  Take 1 tablet (5 mg total) by mouth 2 (two) times daily.   ascorbic acid 500 MG tablet Commonly known as: VITAMIN C Take 500 mg by mouth daily.   augmented betamethasone  dipropionate 0.05 % cream Commonly known as: DIPROLENE -AF Apply 1 application  topically daily as needed (to affected areas- for itching).   carisoprodol  350 MG tablet Commonly known as: Soma  Take 1 tablet (350 mg total) by mouth  4 (four) times daily as needed for muscle spasms. What changed:  when to take this reasons to take this   cefadroxil  500 MG capsule Commonly known as: DURICEF Take 2 capsules (1,000 mg total) by mouth 2 (two) times daily for 5 days.   DULoxetine  60 MG capsule Commonly known as: CYMBALTA  TAKE 1 CAPSULE BY MOUTH EVERY DAY   Dupixent 300 MG/2ML Soaj Generic drug: Dupilumab Inject 300 mg into the skin every 14 (fourteen) days.   fluticasone  50 MCG/ACT nasal spray Commonly known as: FLONASE  Place 2 sprays into both nostrils daily as needed for allergies or rhinitis.   fluticasone -salmeterol 250-50 MCG/ACT Aepb Commonly known as: Wixela Inhub Inhale 1 puff into the lungs in the morning and at bedtime.   gabapentin  100 MG capsule Commonly known as: NEURONTIN  Take 1 capsule (100 mg total) by mouth 3 (three) times daily.   ipratropium-albuterol  0.5-2.5 (3) MG/3ML Soln Commonly known as: DUONEB TAKE 3 ML BY NEBULIZATION EVERY 4 HOURS AS NEEDED (WHEEZING).   leptospermum manuka honey Pste paste Apply 1 Application topically daily.   metoprolol  tartrate 25 MG tablet Commonly known as: LOPRESSOR  Take 0.5 tablets (12.5 mg total) by mouth 2 (two) times daily.   omeprazole  20 MG capsule Commonly known as: PRILOSEC TAKE 1 CAPSULE BY MOUTH TWICE A DAY   predniSONE  10 MG tablet Commonly known as: DELTASONE  TAKE 1 TABLET (10 MG TOTAL) BY MOUTH DAILY WITH BREAKFAST.   Repatha  SureClick 140 MG/ML Soaj Generic drug:  Evolocumab  INJECT 140 MG INTO THE SKIN EVERY 14 DAYS What changed: See the new instructions.   SENIOR VITES PO Take 1 tablet by mouth daily with breakfast.   Spiriva  Respimat 2.5 MCG/ACT Aers Generic drug: Tiotropium Bromide  Monohydrate Take 2 puffs by nebulization daily. What changed: how to take this   tamsulosin  0.4 MG Caps capsule Commonly known as: FLOMAX  TAKE 1 CAPSULE BY MOUTH EVERY DAY   tirzepatide 5 MG/0.5ML Pen Commonly known as: MOUNJARO Inject 5 mg into the skin once a week. What changed: when to take this   torsemide  20 MG tablet Commonly known as: DEMADEX  Take 2 tablets (40 mg total) by mouth daily. What changed:  how much to take when to take this   traMADol  50 MG tablet Commonly known as: ULTRAM  Take 1 tablet (50 mg total) by mouth every 8 (eight) hours as needed (for pain).   Tums 500 MG chewable tablet Generic drug: calcium  carbonate Chew 1 tablet by mouth 2 (two) times daily as needed for indigestion or heartburn.   TYLENOL  500 MG tablet Generic drug: acetaminophen  Take 2,000 mg by mouth in the morning and at bedtime.   venlafaxine  XR 150 MG 24 hr capsule Commonly known as: EFFEXOR -XR TAKE 1 CAPSULE BY MOUTH DAILY WITH BREAKFAST.   Vitamin B-12 2500 MCG Subl Place 2,500 mcg under the tongue daily.   Vitamin D3 50 MCG (2000 UT) Tabs Take 2,000 Units by mouth daily.               Durable Medical Equipment  (From admission, onward)           Start     Ordered   02/10/24 1037  For home use only DME lightweight manual wheelchair with seat cushion  Once       Comments: Patient suffers from CHF, morbid obesity, generalized weakness which impairs their ability to perform daily activities like bathing, dressing, feeding, grooming, and toileting in the home.  A cane, crutch, or walker will not  resolve  issue with performing activities of daily living. A wheelchair will allow patient to safely perform daily activities. Patient is not able  to propel themselves in the home using a standard weight wheelchair due to arm weakness, endurance, and general weakness. Patient can self propel in the lightweight wheelchair. Length of need Lifetime. Accessories: elevating leg rests (ELRs), wheel locks, extensions and anti-tippers.   02/10/24 1036   02/10/24 1036  For home use only DME Hospital bed  Once       Question Answer Comment  Length of Need Lifetime   Head must be elevated greater than: 30 degrees   Bed type Semi-electric   Hoyer Lift Yes   Support Surface: Gel Overlay      02/10/24 1036              Discharge Care Instructions  (From admission, onward)           Start     Ordered   02/12/24 0000  Discharge wound care:       Comments: Wound care  Daily      Comments: Cleanse L leg wounds with Vashe wound cleanser Timm Foot [phone]) do not rinse and allow to air dry.  Apply Xeroform gauze (Lawson [phone]) to serum filled blister (distal to knee).  Apply Medihoney to necrotic wound bed lower leg daily, cover with dry gauze and ABD pad.  Secure with Kerlix roll gauze anchored around foot and ending right below knee.   02/12/24 0755            Consultations: Pulmonology admitted patient  Procedures/Studies:   MR TIBIA FIBULA LEFT W WO CONTRAST Result Date: 02/08/2024 CLINICAL DATA:  Left lower extremity cellulitis. Evaluate for osteomyelitis. EXAM: MRI OF LOWER LEFT EXTREMITY WITHOUT AND WITH CONTRAST TECHNIQUE: Multiplanar, multisequence MR imaging of the left lower leg was performed both before and after administration of intravenous contrast. CONTRAST:  10mL GADAVIST  GADOBUTROL  1 MMOL/ML IV SOLN COMPARISON:  None Available. FINDINGS: Bones/Joint/Cartilage Left total knee arthroplasty partially visualize with susceptibility artifact. No acute fracture or dislocation. No focal marrow signal abnormality. Normal alignment. No joint effusion. Scratch Ligaments Collateral ligaments are intact. Muscles and Tendons Mild  generalized muscle atrophy of bilateral lower legs. Muscle edema in the inferior aspect of the medial gastrocnemius muscle without enhancement likely reflecting mild muscle strain versus less likely myositis. No intramuscular fluid collection or hematoma. Visualized flexor, extensor, peroneal and Achilles tendons are intact. Soft tissue No fluid collection or hematoma.  No soft tissue mass. Severe soft tissue edema in the subcutaneous fat of bilateral lower legs, left greater than right. IMPRESSION: 1. No evidence of osteomyelitis of the left lower leg. 2. Severe soft tissue edema in the subcutaneous fat of bilateral lower legs, left greater than right, which may reflect cellulitis versus reactive edema secondary to venous insufficiency or fluid overload 3. Muscle edema in the inferior aspect of the medial gastrocnemius muscle without enhancement likely reflecting mild muscle strain versus less likely myositis. Electronically Signed   By: Onnie Bilis M.D.   On: 02/08/2024 09:39   DG Tibia/Fibula Left Result Date: 02/06/2024 CLINICAL DATA:  Reason for exam: Infection in left shin. Critical care wanted to look for subcutaneous gas or osteomyelitis. EXAM: LEFT TIBIA AND FIBULA - 2 VIEW COMPARISON:  X-ray left knee 04/14/2023 FINDINGS: Left knee total arthropathy. No cortical erosion or destruction. There is no evidence of fracture or other focal bone lesions. Diffuse knee and proximal mid leg subcutaneus soft tissue edema.  No retained radiopaque foreign body. No radiographic finding of subcutaneus soft tissue emphysema. Vascular calcifications. IMPRESSION: 1. Total left knee arthroplasty. 2. No radiographic findings to suggest osteomyelitis. Limited evaluation due to overlapping osseous structures and overlying soft tissues. If high clinical concern, please consider MRI for further evaluation (with intravenous contrast if GFR greater than 30). 3.  No acute displaced fracture or dislocation. 4. Diffuse subcutaneus  soft tissue edema. Electronically Signed   By: Morgane  Naveau M.D.   On: 02/06/2024 21:35   DG Chest Port 1 View Result Date: 02/06/2024 CLINICAL DATA:  Dyspnea EXAM: PORTABLE CHEST 1 VIEW COMPARISON:  January 25, 2024 FINDINGS: Patient's head and face is obscuring the right upper lobe. No acute infiltrates consolidations or pulmonary edema No pleural effusions Heart normal size No change in the left subclavian bipolar ICDF pacemaker device tip of the leads in good position no evidence of discontinuity IMPRESSION: *No acute cardiopulmonary process. *Patient's head and face is obscuring the right upper lobe. Electronically Signed   By: Fredrich Jefferson M.D.   On: 02/06/2024 14:32   DG Chest Port 1 View Result Date: 01/25/2024 CLINICAL DATA:  Chronic is the Theodosia Fishman failure EXAM: PORTABLE CHEST 1 VIEW COMPARISON:  Yesterday FINDINGS: Worsening retrocardiac aeration. Chronic cardiomegaly with biventricular pacer. Left IJ line with tip at the SVC. Blunting of the costophrenic sulci but no pleural fluid on CT yesterday. IMPRESSION: Worsening aeration at the left base where there was atelectasis by abdominal CT yesterday. Electronically Signed   By: Ronnette Coke M.D.   On: 01/25/2024 07:26   CT ABDOMEN PELVIS WO CONTRAST Result Date: 01/24/2024 CLINICAL DATA:  Abdominal pain EXAM: CT ABDOMEN AND PELVIS WITHOUT CONTRAST TECHNIQUE: Multidetector CT imaging of the abdomen and pelvis was performed following the standard protocol without IV contrast. RADIATION DOSE REDUCTION: This exam was performed according to the departmental dose-optimization program which includes automated exposure control, adjustment of the mA and/or kV according to patient size and/or use of iterative reconstruction technique. COMPARISON:  05/05/2021 FINDINGS: Lower chest: Bibasilar opacities, favor atelectasis. No effusions. Coronary artery and aortic atherosclerosis. Pacer wires in the right heart. Hepatobiliary: No focal hepatic abnormality.  Gallbladder unremarkable. Pancreas: No focal abnormality or ductal dilatation. Spleen: No focal abnormality.  Normal size. Adrenals/Urinary Tract: 3.4 cm cyst off the lower pole of the left kidney is stable. No follow-up imaging recommended. Bilateral nephrolithiasis. No ureteral stones or hydronephrosis. Adrenal glands and urinary bladder unremarkable. Stomach/Bowel: Diffuse colonic diverticulosis, most pronounced in the left colon. No active diverticulitis. Stomach and small bowel decompressed. No obstruction or inflammatory process. Vascular/Lymphatic: Aortoiliac atherosclerosis. No evidence of aneurysm or adenopathy. Reproductive: No visible focal abnormality. Other: No free fluid or free air. Musculoskeletal: No acute bony abnormality. IMPRESSION: No acute findings in the abdomen or pelvis. Colonic diverticulosis.  No active diverticulitis. Bilateral nephrolithiasis.  No hydronephrosis. Aortoiliac atherosclerosis. Electronically Signed   By: Janeece Mechanic M.D.   On: 01/24/2024 21:36   DG CHEST PORT 1 VIEW Result Date: 01/24/2024 CLINICAL DATA:  Central line placement EXAM: PORTABLE CHEST 1 VIEW COMPARISON:  01/24/2024 FINDINGS: Interval placement of a LEFT internal jugular venous catheter with tip terminating in the distal superior vena cava. No pneumothorax. Stable cardiomegaly. Small LEFT effusion unchanged. IMPRESSION: Interval placement of LEFT central venous line without complication. Electronically Signed   By: Deboraha Fallow M.D.   On: 01/24/2024 18:51   CT Head Wo Contrast Result Date: 01/24/2024 CLINICAL DATA:  Syncope/presyncope. EXAM: CT HEAD WITHOUT CONTRAST TECHNIQUE: Contiguous axial  images were obtained from the base of the skull through the vertex without intravenous contrast. RADIATION DOSE REDUCTION: This exam was performed according to the departmental dose-optimization program which includes automated exposure control, adjustment of the mA and/or kV according to patient size and/or  use of iterative reconstruction technique. COMPARISON:  CT head 05/14/2023. FINDINGS: Brain: No acute intracranial hemorrhage. No CT evidence of acute infarct. There is mild parenchymal volume loss with left frontotemporal predominance similar to prior. Nonspecific hypoattenuation in the periventricular and subcortical white matter favored to reflect chronic microvascular ischemic changes. No edema, mass effect, or midline shift. The basilar cisterns are patent. Ventricles: Prominence of the ventricles suggesting underlying parenchymal volume loss. Vascular: No hyperdense vessel or unexpected calcification. Skull: No acute or aggressive finding. Orbits: Orbits are symmetric. Sinuses: The visualized paranasal sinuses are clear. Other: Mastoid air cells are clear. IMPRESSION: No CT evidence of acute intracranial abnormality. Mild parenchymal volume loss with left frontotemporal predominance. Mild chronic microvascular ischemic changes. Electronically Signed   By: Denny Flack M.D.   On: 01/24/2024 15:50   DG Chest Port 1 View Result Date: 01/24/2024 CLINICAL DATA:  Sepsis EXAM: PORTABLE CHEST 1 VIEW COMPARISON:  Chest radiograph dated 05/14/2023 FINDINGS: Lines/tubes: Left chest wall ICD leads project over the right ventricle and tributary of the coronary sinus. Lungs: Low lung volumes with bronchovascular crowding. Bibasilar hazy opacities Pleura: Blunting of the bilateral costophrenic angles. No pneumothorax. Heart/mediastinum: Similar enlarged cardiomediastinal silhouette. Bones: No acute osseous abnormality. IMPRESSION: 1. Low lung volumes with bronchovascular crowding. Bibasilar hazy opacities, which may represent atelectasis, aspiration, or pneumonia. 2. Unchanged small right moderate left pleural effusions. 3. Similar cardiomegaly. Electronically Signed   By: Limin  Xu M.D.   On: 01/24/2024 15:08   CUP PACEART REMOTE DEVICE CHECK Result Date: 01/24/2024 Scheduled remote reviewed. Normal device function.   Presenting rhythm: BiV pace with PVC's Next remote 91 days. LA, CVRS  US  LIMITED JOINT SPACE STRUCTURES UP BILAT(NO LINKED CHARGES) Result Date: 01/23/2024 Procedure: Real-time Ultrasound Guided Injection of right glenohumeral joint Device: GE Logiq Q7 Ultrasound guided injection is preferred based studies that show increased duration, increased effect, greater accuracy, decreased procedural pain, increased response rate with ultrasound guided versus blind injection. Verbal informed consent obtained. Time-out conducted. Noted no overlying erythema, induration, or other signs of local infection. Skin prepped in a sterile fashion. Local anesthesia: Topical Ethyl chloride. With sterile technique and under real time ultrasound guidance:  Joint visualized.  23g 1  inch needle inserted posterior approach. Pictures taken for needle placement. Patient did have injection of 2 cc of 1% lidocaine , 2 cc of 0.5% Marcaine , and 1.0 cc of Kenalog  40 mg/dL. Completed without difficulty Pain immediately resolved suggesting accurate placement of the medication. Advised to call if fevers/chills, erythema, induration, drainage, or persistent bleeding. Images saved Impression: Technically successful ultrasound guided injection.  Procedure: Real-time Ultrasound Guided Injection of left glenohumeral joint Device: GE Logiq E Ultrasound guided injection is preferred based studies that show increased duration, increased effect, greater accuracy, decreased procedural pain, increased response rate with ultrasound guided versus blind injection. Verbal informed consent obtained. Time-out conducted. Noted no overlying erythema, induration, or other signs of local infection. Skin prepped in a sterile fashion. Local anesthesia: Topical Ethyl chloride. With sterile technique and under real time ultrasound guidance:  Joint visualized.  21g 2 inch needle inserted posterior approach. Pictures taken for needle placement. Patient did have injection of  2 cc of 0.5% Marcaine , and 1cc of Kenalog  40 mg/dL. Completed  without difficulty Pain immediately resolved suggesting accurate placement of the medication. Advised to call if fevers/chills, erythema, induration, drainage, or persistent bleeding. Images permanently stored Impression: Technically successful ultrasound guided injection.   DG Foot Complete Right Result Date: 01/23/2024 Please see detailed radiograph report in office note.      The results of significant diagnostics from this hospitalization (including imaging, microbiology, ancillary and laboratory) are listed below for reference.     Microbiology: Recent Results (from the past 240 hours)  Blood culture (routine x 2)     Status: None   Collection Time: 02/06/24  8:05 PM   Specimen: BLOOD RIGHT FOREARM  Result Value Ref Range Status   Specimen Description BLOOD RIGHT FOREARM  Final   Special Requests   Final    BOTTLES DRAWN AEROBIC AND ANAEROBIC Blood Culture results may not be optimal due to an inadequate volume of blood received in culture bottles   Culture   Final    NO GROWTH 5 DAYS Performed at [facility], [address], [place], [place] [address]    Report Status 02/11/2024 FINAL  Final  Blood culture (routine x 2)     Status: None   Collection Time: 02/06/24  8:15 PM   Specimen: BLOOD LEFT ARM  Result Value Ref Range Status   Specimen Description BLOOD LEFT ARM  Final   Special Requests   Final    BOTTLES DRAWN AEROBIC AND ANAEROBIC Blood Culture adequate volume   Culture   Final    NO GROWTH 5 DAYS Performed at [facility], [address], [place], [place] [address]    Report Status 02/11/2024 FINAL  Final  MRSA Next Gen by PCR, Nasal     Status: None   Collection Time: 02/06/24 10:25 PM   Specimen: Nasal Mucosa; Nasal Swab  Result Value Ref Range Status   MRSA by PCR Next Gen NOT DETECTED NOT DETECTED Final    Comment: (NOTE) The GeneXpert MRSA Assay (FDA approved for NASAL specimens  only), is one component of a comprehensive MRSA colonization surveillance program. It is not intended to diagnose MRSA infection nor to guide or monitor treatment for MRSA infections. Test performance is not FDA approved in patients less than 67 years old. Performed at [facility], [address] [place], [place] [address]      Labs:  CBC: Recent Labs  Lab 02/06/24 1320 02/07/24 0235 02/09/24 0500 02/09/24 1423 02/10/24 0241 02/11/24 0240 02/12/24 0238 02/13/24 0244  WBC 20.8*   < > 15.3*  --  11.7* 9.5 12.1* 11.5*  NEUTROABS 20.0*  --   --   --   --   --   --   --   HGB 10.2*   < > 9.1* 10.2* 9.3* 9.6* 10.0* 9.9*  HCT 36.3*   < > 28.7* 30.0* 29.7* 31.1* 32.6* 32.3*  MCV 107.7*   < > 94.4  --  95.2 95.7 96.4 95.6  PLT 233   < > 260  --  274 274 297 298   < > = values in this interval not displayed.   BMP &GFR Recent Labs  Lab 02/07/24 0235 02/08/24 0219 02/08/24 0430 02/08/24 1723 02/09/24 0500 02/09/24 1423 02/10/24 0241 02/11/24 0240 02/12/24 0238 02/13/24 0244  NA 135   < > 129*   < > 131* 130* 135 137 137 139  K 4.8   < > 4.6   < > 4.1 4.4 4.7 4.4 4.9 5.0  CL 97*  --  93*   < > 94*  --  102 104 102 101  CO2 24  --  22   < > 23  --  24 25 27 28   GLUCOSE 66*  --  119*   < > 97  --  91 109* 78 95  BUN 41*  --  60*   < > 64*  --  56* 39* 31* 23  CREATININE 2.77*  --  3.19*   < > 2.27*  --  1.60* 1.04 1.00 0.97  CALCIUM  8.3*  --  7.7*   < > 8.4*  --  9.0 8.8* 8.7* 8.6*  MG 1.5*  --  2.2  --   --   --   --  2.4 1.9 1.6*  PHOS 6.2*  --   --   --   --   --   --  2.7 2.2* 2.7   < > = values in this interval not displayed.   Estimated Creatinine Clearance: 94.3 mL/min (by C-G formula based on SCr of 0.97 mg/dL). Liver & Pancreas: Recent Labs  Lab 02/06/24 1320 02/11/24 0240 02/12/24 0238 02/13/24 0244  AST 25  --   --   --   ALT 28  --   --   --   ALKPHOS 67  --   --   --   BILITOT 0.8  --   --   --   PROT 5.1*  --   --   --   ALBUMIN  2.8* 2.1*  2.3* 2.2*   No results for input(s): "LIPASE", "AMYLASE" in the last 168 hours. No results for input(s): "AMMONIA" in the last 168 hours. Diabetic: No results for input(s): "HGBA1C" in the last 72 hours. Recent Labs  Lab 02/12/24 0524 02/12/24 1618 02/12/24 2135 02/12/24 2347 02/13/24 0446  GLUCAP 89 103* 97 121* 85   Cardiac Enzymes: No results for input(s): "CKTOTAL", "CKMB", "CKMBINDEX", "TROPONINI" in the last 168 hours. No results for input(s): "PROBNP" in the last 8760 hours. Coagulation Profile: No results for input(s): "INR", "PROTIME" in the last 168 hours. Thyroid  Function Tests: No results for input(s): "TSH", "T4TOTAL", "FREET4", "T3FREE", "THYROIDAB" in the last 72 hours. Lipid Profile: No results for input(s): "CHOL", "HDL", "LDLCALC", "TRIG", "CHOLHDL", "LDLDIRECT" in the last 72 hours. Anemia Panel: No results for input(s): "VITAMINB12", "FOLATE", "FERRITIN", "TIBC", "IRON", "RETICCTPCT" in the last 72 hours. Urine analysis:    Component Value Date/Time   COLORURINE AMBER (A) 02/08/2024 0430   APPEARANCEUR HAZY (A) 02/08/2024 0430   LABSPEC 1.024 02/08/2024 0430   PHURINE 5.0 02/08/2024 0430   GLUCOSEU NEGATIVE 02/08/2024 0430   GLUCOSEU NEGATIVE 01/07/2024 1546   HGBUR SMALL (A) 02/08/2024 0430   BILIRUBINUR NEGATIVE 02/08/2024 0430   KETONESUR 5 (A) 02/08/2024 0430   PROTEINUR 30 (A) 02/08/2024 0430   UROBILINOGEN 0.2 01/07/2024 1546   NITRITE NEGATIVE 02/08/2024 0430   LEUKOCYTESUR TRACE (A) 02/08/2024 0430   Sepsis Labs: Invalid input(s): "PROCALCITONIN", "LACTICIDVEN"   SIGNED:  Theadore Finger, MD  [facility] 02/13/2024, 6:48 AM

## 2024-02-14 ENCOUNTER — Telehealth: Payer: Self-pay | Admitting: *Deleted

## 2024-02-14 DIAGNOSIS — G894 Chronic pain syndrome: Secondary | ICD-10-CM | POA: Diagnosis not present

## 2024-02-14 DIAGNOSIS — F32A Depression, unspecified: Secondary | ICD-10-CM | POA: Diagnosis not present

## 2024-02-14 DIAGNOSIS — J449 Chronic obstructive pulmonary disease, unspecified: Secondary | ICD-10-CM | POA: Diagnosis not present

## 2024-02-14 DIAGNOSIS — K219 Gastro-esophageal reflux disease without esophagitis: Secondary | ICD-10-CM | POA: Diagnosis not present

## 2024-02-14 NOTE — Patient Outreach (Signed)
 Phone call to patient to complete initial assessment. Patient currently in rehab at [name]. CSW will follow up with patient post discharge from rehab.  Initial appointment rescheduled for 03/06/24 at 2pm.    Michaelle Adolphus, LCSW [place]  [facility], [facility] Licensed Clinical Social Worker Care Coordinator  Direct Dial: [phone]

## 2024-02-14 NOTE — Transitions of Care (Post Inpatient/ED Visit) (Signed)
 02/14/2024  Patient ID: [name], male   DOB: [birth year], 68 y.o.   MRN: [id number]  Chart Review for Transitions of Care. Patient discharged to SNF.  Amarien Carne J. Patrena Santalucia RN, MSN [facility], [facility] RN Care Manager Direct Dial: [phone]  Fax: [phone] Website: [email or web address]

## 2024-02-17 ENCOUNTER — Encounter

## 2024-02-17 ENCOUNTER — Other Ambulatory Visit: Payer: Self-pay | Admitting: Family Medicine

## 2024-02-17 ENCOUNTER — Encounter: Payer: Self-pay | Admitting: *Deleted

## 2024-02-17 NOTE — Telephone Encounter (Signed)
 [employer]  approved 10/16/23-10/14/24

## 2024-02-18 ENCOUNTER — Encounter: Payer: Self-pay | Admitting: Cardiology

## 2024-02-18 ENCOUNTER — Telehealth: Payer: Self-pay

## 2024-02-18 ENCOUNTER — Telehealth: Payer: Self-pay | Admitting: *Deleted

## 2024-02-18 DIAGNOSIS — F32A Depression, unspecified: Secondary | ICD-10-CM | POA: Diagnosis not present

## 2024-02-18 DIAGNOSIS — L89890 Pressure ulcer of other site, unstageable: Secondary | ICD-10-CM | POA: Diagnosis not present

## 2024-02-18 DIAGNOSIS — I83028 Varicose veins of left lower extremity with ulcer other part of lower leg: Secondary | ICD-10-CM | POA: Diagnosis not present

## 2024-02-18 DIAGNOSIS — G894 Chronic pain syndrome: Secondary | ICD-10-CM | POA: Diagnosis not present

## 2024-02-18 DIAGNOSIS — S71002A Unspecified open wound, left hip, initial encounter: Secondary | ICD-10-CM | POA: Diagnosis not present

## 2024-02-18 NOTE — Telephone Encounter (Signed)
 ICM call to patient. He is currently in rehab after hospitalization 4/11.  He does not have the device monitor with him in rehab.  The rehab nurse came in and he will call back.  He is going to ask what fluid pill he is being prescribed in rehab.

## 2024-02-18 NOTE — Telephone Encounter (Signed)
 Returned patient call.  He was unable to ask the nurse about what fluid is he taking.  He reports Dr Rodolfo Clan advised his battery will need to be replaced this year.  Advised that he has 9 months left on his battery for the alert to occur then another 3 months to have the battery replaced.  He was not sure why his monitor did not alert to his low home BP the day he was admitted to the hospital. Explained the chest device is to take care of any electrical rhythm problems that occur and can not detect BP or heart attack.    Pt concerned about fluid overload and not sure what he is taking in rehab for fluid management.  He will ask the nurse.  He will have someone bring his home monitor to rehab and send a manual report when he receives it.  He will be in rehab for at least 21 days and has lost the ability to walk but getting PT to improve strength.  He's been very sick and so worried about his health and this has been a frightening time regarding his health.    Encouraged to talk with the staff there or physician if needed for his concerns.    He appreciate the call and his ICM phone number.

## 2024-02-19 DIAGNOSIS — L03116 Cellulitis of left lower limb: Secondary | ICD-10-CM | POA: Diagnosis not present

## 2024-02-19 DIAGNOSIS — R2681 Unsteadiness on feet: Secondary | ICD-10-CM | POA: Diagnosis not present

## 2024-02-19 DIAGNOSIS — R1311 Dysphagia, oral phase: Secondary | ICD-10-CM | POA: Diagnosis not present

## 2024-02-19 DIAGNOSIS — R1314 Dysphagia, pharyngoesophageal phase: Secondary | ICD-10-CM | POA: Diagnosis not present

## 2024-02-19 DIAGNOSIS — F419 Anxiety disorder, unspecified: Secondary | ICD-10-CM | POA: Diagnosis not present

## 2024-02-19 DIAGNOSIS — J449 Chronic obstructive pulmonary disease, unspecified: Secondary | ICD-10-CM | POA: Diagnosis not present

## 2024-02-19 DIAGNOSIS — I480 Paroxysmal atrial fibrillation: Secondary | ICD-10-CM | POA: Diagnosis not present

## 2024-02-19 DIAGNOSIS — M6281 Muscle weakness (generalized): Secondary | ICD-10-CM | POA: Diagnosis not present

## 2024-02-19 NOTE — Telephone Encounter (Signed)
 ICM remote transmission rescheduled for 03/02/2024.   Will send manual remote transmission when monitor is brought to inpatient rehab.

## 2024-02-20 ENCOUNTER — Other Ambulatory Visit: Payer: Self-pay | Admitting: Cardiology

## 2024-02-21 DIAGNOSIS — E119 Type 2 diabetes mellitus without complications: Secondary | ICD-10-CM | POA: Diagnosis not present

## 2024-02-21 DIAGNOSIS — I480 Paroxysmal atrial fibrillation: Secondary | ICD-10-CM | POA: Diagnosis not present

## 2024-02-21 DIAGNOSIS — G4730 Sleep apnea, unspecified: Secondary | ICD-10-CM | POA: Diagnosis not present

## 2024-02-21 DIAGNOSIS — G894 Chronic pain syndrome: Secondary | ICD-10-CM | POA: Diagnosis not present

## 2024-02-21 DIAGNOSIS — F32A Depression, unspecified: Secondary | ICD-10-CM | POA: Diagnosis not present

## 2024-02-21 DIAGNOSIS — J449 Chronic obstructive pulmonary disease, unspecified: Secondary | ICD-10-CM | POA: Diagnosis not present

## 2024-02-21 DIAGNOSIS — L03116 Cellulitis of left lower limb: Secondary | ICD-10-CM | POA: Diagnosis not present

## 2024-02-21 DIAGNOSIS — I255 Ischemic cardiomyopathy: Secondary | ICD-10-CM | POA: Diagnosis not present

## 2024-02-25 DIAGNOSIS — L89890 Pressure ulcer of other site, unstageable: Secondary | ICD-10-CM | POA: Diagnosis not present

## 2024-02-25 DIAGNOSIS — S71002A Unspecified open wound, left hip, initial encounter: Secondary | ICD-10-CM | POA: Diagnosis not present

## 2024-02-25 DIAGNOSIS — I83028 Varicose veins of left lower extremity with ulcer other part of lower leg: Secondary | ICD-10-CM | POA: Diagnosis not present

## 2024-02-25 NOTE — Progress Notes (Deleted)
 @Patient  ID: [name], male    DOB: 1956/04/13, 68 y.o.   MRN: [id number]  No chief complaint on file.   Referring provider: Roslyn Coombe, MD  HPI: 68 year old male, former smoker followed for COPD with asthma.  He is a patient of Dr. Billie Budge and last seen in office 01/21/2024.  Past medical history significant for hypertension, A-fib on Eliquis , OSA not on CPAP, DM2, NICM status post AICD, chronic diastolic heart failure.   TEST/EVENTS:  06/20/2023 ONO on room air: did not qualify for O2 06/25/2023 HST: mild OSA   01/21/2024: OV with Dr. Washington Hacker.  Respiratory symptoms have improved since colder weather has resolved.  Compliant with Wixela, Spiriva  and albuterol  up to twice a day.  Minimal wheeze.  Limited activity with cane but able to mobilize in the home.  No exacerbations since last visit.  Prior ambulatory O2 without desaturations on room air.   02/26/2024: Today [facility] follow-up Patient presents today for hospital follow-up.  He was admitted 01/24/2024 to 01/27/2024 for hypovolemic/septic shock secondary to viral gastroenteritis complicated by diarrhea, volume depletion and relative adrenal insufficiency.  He was treated with Augmentin , fluid resuscitation, and held diuretics.  He was discharged on supplemental oxygen  at 2 L/min.  He was then readmitted 02/06/2024 to 02/13/2024 for septic shock related to left lower extremity cellulitis.  He was hypotensive during admission and required vasopressors.  He was started on broad-spectrum antibiotics and eventually de-escalated to IV Ancef  then discharged on p.o. cefadroxil .  Diuretic regimen was resumed prior to discharge.  Allergies  Allergen Reactions   Tape Other (See Comments)    SKIN TEARS VERY, VERY EASILY!!!! Use Paper Tape Only, PLEASE!!!   Amiodarone  Other (See Comments)    Caused hyperthyroidism    Statins Other (See Comments)    Myalgias    Sulfa  Antibiotics Other (See Comments)    Mouth ulcers   Wound Dressing Adhesive  Other (See Comments)    Skin Tears Use Paper Tape Only    Immunization History  Administered Date(s) Administered   Fluad Quad(high Dose 65+) 06/29/2022   Fluad Trivalent(High Dose 65+) 07/01/2023   Influenza Inj Mdck Quad With Preservative 07/22/2019   Influenza Split 07/09/2011, 07/23/2012   Influenza Whole 09/16/2006, 08/26/2007, 07/27/2008, 07/26/2009, 07/07/2010   Influenza, High Dose Seasonal PF 06/22/2020, 07/07/2021   Influenza,inj,Quad PF,6+ Mos 06/30/2013, 08/19/2014, 06/27/2015, 07/13/2016, 07/09/2017, 07/18/2018   PFIZER(Purple Top)SARS-COV-2 Vaccination 03/21/2020, 04/11/2020, 11/03/2020   Pneumococcal Conjugate-13 04/29/2017   Pneumococcal Polysaccharide-23 03/29/2010, 07/07/2010, 02/23/2015, 03/17/2021   Td 10/16/2003   Tdap 02/23/2015    Past Medical History:  Diagnosis Date   AICD (automatic cardioverter/defibrillator) present    Anemia    supposed to be taking Vit B but doesn't   ANXIETY    takes Xanax  nightly   Arthritis    Asthma    Albuterol  prn and Advair  daily;also takes Prednisone  daily   Cardiomyopathy (HCC)    a. EF 25% TEE July 2013; b. EF normalized 2015;  c. 03/2015 Echo: EF 40-45%, difrf HK, PASP 38 mmHg, Mild MR, sev LAE/RAE.   Chronic constipation    takes OTC stool softener   COPD (chronic obstructive pulmonary disease) (HCC)    "one dr says COPD; one dr says emphysema" (09/18/2017)   COVID-19 12/09/2019   DEPRESSION    takes Zoloft  and Doxepin  daily   Diverticulitis    DYSKINESIA, ESOPHAGUS    Essential hypertension        FIBROMYALGIA    GERD (gastroesophageal  reflux disease)        Glaucoma    HYPERLIPIDEMIA    a. Intolerant to statins.   INSOMNIA    takes Ambien  nightly   Myocardial infarction [facility])    a. 2012 Myoview  notable for prior infarct;  b. 03/2015 Lexiscan  CL: EF 37%, diff HK, small area of inferior infarct from apex to base-->Med Rx.   O2 dependent    "2.5L q hs & prn" (09/18/2017)   Paroxysmal atrial fibrillation (HCC)     a. CHA2DS2VASc = 3--> takes Coumadin ;  b. 03/15/2015 Successful TEE/DCCV;  c. 03/2015 recurrent afib, Amio d/c'd in setting of hyperthyroidism.   Peripheral neuropathy    Pneumonia 12/2016   Rash and other nonspecific skin eruption 04/12/2009   no cause found saw dermatologists x 2 and allergist   SLEEP APNEA, OBSTRUCTIVE    a. doesn't use CPAP   Syncope    a. 03/2015 s/p MDT LINQ.   Type II diabetes mellitus (HCC)         Tobacco History: Social History   Tobacco Use  Smoking Status Former   Current packs/day: 0.00   Average packs/day: 2.0 packs/day for 30.0 years (60.0 ttl pk-yrs)   Types: Cigarettes   Start date: 10/15/1977   Quit date: 10/16/2007   Years since quitting: 16.3  Smokeless Tobacco Never   Counseling given: Not Answered   Outpatient Medications Prior to Visit  Medication Sig Dispense Refill   albuterol  (VENTOLIN  HFA) 108 (90 Base) MCG/ACT inhaler INHALE 1-2 PUFFS BY MOUTH EVERY 6 HOURS AS NEEDED FOR WHEEZE OR SHORTNESS OF BREATH 8.5 each 5   ALPRAZolam  (XANAX ) 0.5 MG tablet Take 1 tablet (0.5 mg total) by mouth at bedtime. 30 tablet 0   ammonium lactate  (AMLACTIN) 12 % cream Apply 1 Application topically as needed for dry skin. 385 g 3   apixaban  (ELIQUIS ) 5 MG TABS tablet Take 1 tablet (5 mg total) by mouth 2 (two) times daily. 60 tablet 11   ascorbic acid (VITAMIN C) 500 MG tablet Take 500 mg by mouth daily.     augmented betamethasone  dipropionate (DIPROLENE -AF) 0.05 % cream Apply 1 application  topically daily as needed (to affected areas- for itching).     carisoprodol  (SOMA ) 350 MG tablet Take 1 tablet (350 mg total) by mouth 4 (four) times daily as needed for muscle spasms. 120 tablet 0   Cholecalciferol  (VITAMIN D3) 50 MCG (2000 UT) TABS Take 2,000 Units by mouth daily.     Cyanocobalamin  (VITAMIN B-12) 2500 MCG SUBL Place 2,500 mcg under the tongue daily.     DULoxetine  (CYMBALTA ) 60 MG capsule TAKE 1 CAPSULE BY MOUTH EVERY DAY 90 capsule 2   Dupilumab  (DUPIXENT) 300 MG/2ML SOPN Inject 300 mg into the skin every 14 (fourteen) days.     Evolocumab  (REPATHA  SURECLICK) 140 MG/ML SOAJ INJECT 140 MG INTO THE SKIN EVERY 14 DAYS 6 mL 0   fluticasone  (FLONASE ) 50 MCG/ACT nasal spray Place 2 sprays into both nostrils daily as needed for allergies or rhinitis. 48 mL 1   fluticasone -salmeterol (WIXELA INHUB) 250-50 MCG/ACT AEPB Inhale 1 puff into the lungs in the morning and at bedtime. 60 each 11   gabapentin  (NEURONTIN ) 100 MG capsule Take 1 capsule (100 mg total) by mouth 3 (three) times daily. 90 capsule 3   ipratropium-albuterol  (DUONEB) 0.5-2.5 (3) MG/3ML SOLN TAKE 3 ML BY NEBULIZATION EVERY 4 HOURS AS NEEDED (WHEEZING). 360 mL 0   leptospermum manuka honey (MEDIHONEY) PSTE paste Apply 1  Application topically daily. 44 mL 2   metoprolol  tartrate (LOPRESSOR ) 25 MG tablet Take 0.5 tablets (12.5 mg total) by mouth 2 (two) times daily.     Multiple Vitamins-Minerals (SENIOR VITES PO) Take 1 tablet by mouth daily with breakfast.     omeprazole  (PRILOSEC) 20 MG capsule TAKE 1 CAPSULE BY MOUTH TWICE A DAY 180 capsule 1   predniSONE  (DELTASONE ) 10 MG tablet TAKE 1 TABLET (10 MG TOTAL) BY MOUTH DAILY WITH BREAKFAST. 30 tablet 0   tamsulosin  (FLOMAX ) 0.4 MG CAPS capsule TAKE 1 CAPSULE BY MOUTH EVERY DAY 90 capsule 3   Tiotropium Bromide  Monohydrate (SPIRIVA  RESPIMAT) 2.5 MCG/ACT AERS Take 2 puffs by nebulization daily. (Patient taking differently: Inhale 2 puffs into the lungs daily.) 4 g 3   tirzepatide (MOUNJARO) 5 MG/0.5ML Pen Inject 5 mg into the skin once a week. (Patient taking differently: Inject 5 mg into the skin every Wednesday.) 6 mL 3   torsemide  (DEMADEX ) 20 MG tablet Take 2 tablets (40 mg total) by mouth daily.     traMADol  (ULTRAM ) 50 MG tablet Take 1 tablet (50 mg total) by mouth every 8 (eight) hours as needed (for pain). 90 tablet 0   TUMS 500 MG chewable tablet Chew 1 tablet by mouth 2 (two) times daily as needed for indigestion or heartburn.      TYLENOL  500 MG tablet Take 2,000 mg by mouth in the morning and at bedtime.     venlafaxine  XR (EFFEXOR -XR) 150 MG 24 hr capsule TAKE 1 CAPSULE BY MOUTH DAILY WITH BREAKFAST. 90 capsule 3   No facility-administered medications prior to visit.     Review of Systems:   Constitutional: No weight loss or gain, night sweats, fevers, chills, fatigue, or lassitude. HEENT: No headaches, difficulty swallowing, tooth/dental problems, or sore throat. No sneezing, itching, ear ache, nasal congestion, or post nasal drip CV:  No chest pain, orthopnea, PND, swelling in lower extremities, anasarca, dizziness, palpitations, syncope Resp: No shortness of breath with exertion or at rest. No excess mucus or change in color of mucus. No productive or non-productive. No hemoptysis. No wheezing.  No chest wall deformity GI:  No heartburn, indigestion, abdominal pain, nausea, vomiting, diarrhea, change in bowel habits, loss of appetite, bloody stools.  GU: No dysuria, change in color of urine, urgency or frequency.  No flank pain, no hematuria  Skin: No rash, lesions, ulcerations MSK:  No joint pain or swelling.  No decreased range of motion.  No back pain. Neuro: No dizziness or lightheadedness.  Psych: No depression or anxiety. Mood stable.     Physical Exam:  There were no vitals taken for this visit.  GEN: Pleasant, interactive, well-nourished/chronically-ill appearing/acutely-ill appearing/poorly-nourished/morbidly obese; in no acute distress.****** HEENT:  Normocephalic and atraumatic. EACs patent bilaterally. TM pearly gray with present light reflex bilaterally. PERRLA. Sclera white. Nasal turbinates pink, moist and patent bilaterally. No rhinorrhea present. Oropharynx pink and moist, without exudate or edema. No lesions, ulcerations, or postnasal drip.  NECK:  Supple w/ fair ROM. No JVD present. Normal carotid impulses w/o bruits. Thyroid  symmetrical with no goiter or nodules palpated. No  lymphadenopathy.   CV: RRR, no m/r/g, no peripheral edema. Pulses intact, +2 bilaterally. No cyanosis, pallor or clubbing. PULMONARY:  Unlabored, regular breathing. Clear bilaterally A&P w/o wheezes/rales/rhonchi. No accessory muscle use.  GI: BS present and normoactive. Soft, non-tender to palpation. No organomegaly or masses detected. No CVA tenderness. MSK: No erythema, warmth or tenderness. Cap refil <2 sec all extrem. No  deformities or joint swelling noted.  Neuro: A/Ox3. No focal deficits noted.   Skin: Warm, no lesions or rashe Psych: Normal affect and behavior. Judgement and thought content appropriate.     Lab Results:  CBC    Component Value Date/Time   WBC 11.5 (H) 02/13/2024 0244   RBC 3.38 (L) 02/13/2024 0244   HGB 9.9 (L) 02/13/2024 0244   HGB 12.8 (L) 12/19/2017 1532   HCT 32.3 (L) 02/13/2024 0244   HCT 38.0 12/19/2017 1532   PLT 298 02/13/2024 0244   PLT 208 12/19/2017 1532   MCV 95.6 02/13/2024 0244   MCV 99 (H) 12/19/2017 1532   MCH 29.3 02/13/2024 0244   MCHC 30.7 02/13/2024 0244   RDW 14.8 02/13/2024 0244   RDW 14.0 12/19/2017 1532   LYMPHSABS 0.6 (L) 02/06/2024 1320   LYMPHSABS 1.9 12/19/2017 1532   MONOABS 0.2 02/06/2024 1320   EOSABS 0.0 02/06/2024 1320   EOSABS 0.3 12/19/2017 1532   BASOSABS 0.0 02/06/2024 1320   BASOSABS 0.0 12/19/2017 1532    BMET    Component Value Date/Time   NA 139 02/13/2024 0244   NA 141 01/25/2023 1358   K 5.0 02/13/2024 0244   CL 101 02/13/2024 0244   CO2 28 02/13/2024 0244   GLUCOSE 95 02/13/2024 0244   BUN 23 02/13/2024 0244   BUN 41 (H) 01/25/2023 1358   CREATININE 0.97 02/13/2024 0244   CREATININE 0.92 03/19/2016 1420   CALCIUM  8.6 (L) 02/13/2024 0244   GFRNONAA >60 02/13/2024 0244   GFRAA >60 07/14/2020 1339    BNP    Component Value Date/Time   BNP 56.2 02/03/2024 1615     Imaging:  MR TIBIA FIBULA LEFT W WO CONTRAST Result Date: 02/08/2024 CLINICAL DATA:  Left lower extremity cellulitis.  Evaluate for osteomyelitis. EXAM: MRI OF LOWER LEFT EXTREMITY WITHOUT AND WITH CONTRAST TECHNIQUE: Multiplanar, multisequence MR imaging of the left lower leg was performed both before and after administration of intravenous contrast. CONTRAST:  10mL GADAVIST  GADOBUTROL  1 MMOL/ML IV SOLN COMPARISON:  None Available. FINDINGS: Bones/Joint/Cartilage Left total knee arthroplasty partially visualize with susceptibility artifact. No acute fracture or dislocation. No focal marrow signal abnormality. Normal alignment. No joint effusion. Scratch Ligaments Collateral ligaments are intact. Muscles and Tendons Mild generalized muscle atrophy of bilateral lower legs. Muscle edema in the inferior aspect of the medial gastrocnemius muscle without enhancement likely reflecting mild muscle strain versus less likely myositis. No intramuscular fluid collection or hematoma. Visualized flexor, extensor, peroneal and Achilles tendons are intact. Soft tissue No fluid collection or hematoma.  No soft tissue mass. Severe soft tissue edema in the subcutaneous fat of bilateral lower legs, left greater than right. IMPRESSION: 1. No evidence of osteomyelitis of the left lower leg. 2. Severe soft tissue edema in the subcutaneous fat of bilateral lower legs, left greater than right, which may reflect cellulitis versus reactive edema secondary to venous insufficiency or fluid overload 3. Muscle edema in the inferior aspect of the medial gastrocnemius muscle without enhancement likely reflecting mild muscle strain versus less likely myositis. Electronically Signed   By: Onnie Bilis M.D.   On: 02/08/2024 09:39   DG Tibia/Fibula Left Result Date: 02/06/2024 CLINICAL DATA:  Reason for exam: Infection in left shin. Critical care wanted to look for subcutaneous gas or osteomyelitis. EXAM: LEFT TIBIA AND FIBULA - 2 VIEW COMPARISON:  X-ray left knee 04/14/2023 FINDINGS: Left knee total arthropathy. No cortical erosion or destruction. There is no  evidence of fracture or  other focal bone lesions. Diffuse knee and proximal mid leg subcutaneus soft tissue edema. No retained radiopaque foreign body. No radiographic finding of subcutaneus soft tissue emphysema. Vascular calcifications. IMPRESSION: 1. Total left knee arthroplasty. 2. No radiographic findings to suggest osteomyelitis. Limited evaluation due to overlapping osseous structures and overlying soft tissues. If high clinical concern, please consider MRI for further evaluation (with intravenous contrast if GFR greater than 30). 3.  No acute displaced fracture or dislocation. 4. Diffuse subcutaneus soft tissue edema. Electronically Signed   By: Morgane  Naveau M.D.   On: 02/06/2024 21:35   DG Chest Port 1 View Result Date: 02/06/2024 CLINICAL DATA:  Dyspnea EXAM: PORTABLE CHEST 1 VIEW COMPARISON:  January 25, 2024 FINDINGS: Patient's head and face is obscuring the right upper lobe. No acute infiltrates consolidations or pulmonary edema No pleural effusions Heart normal size No change in the left subclavian bipolar ICDF pacemaker device tip of the leads in good position no evidence of discontinuity IMPRESSION: *No acute cardiopulmonary process. *Patient's head and face is obscuring the right upper lobe. Electronically Signed   By: Fredrich Jefferson M.D.   On: 02/06/2024 14:32    Administration History     None          Latest Ref Rng & Units 01/27/2014   10:55 AM  PFT Results  FVC-Pre L 3.33   FVC-Predicted Pre % 71   FVC-Post L 3.35   FVC-Predicted Post % 72   Pre FEV1/FVC % % 65   Post FEV1/FCV % % 72   FEV1-Pre L 2.17   FEV1-Predicted Pre % 61   FEV1-Post L 2.41   DLCO uncorrected ml/min/mmHg 24.13   DLCO UNC% % 77   DLVA Predicted % 90   TLC L 6.41   TLC % Predicted % 94   RV % Predicted % 137     No results found for: "NITRICOXIDE"      Assessment & Plan:   No problem-specific Assessment & Plan notes found for this encounter.   Advised if symptoms do not improve or  worsen, to please contact office for sooner follow up or seek emergency care.   I spent *** minutes of dedicated to the care of this patient on the date of this encounter to include pre-visit review of records, face-to-face time with the patient discussing conditions above, post visit ordering of testing, clinical documentation with the electronic health record, making appropriate referrals as documented, and communicating necessary findings to members of the patients care team.  Roetta Clarke, NP 02/25/2024  Pt aware and understands NP's role.

## 2024-02-26 ENCOUNTER — Inpatient Hospital Stay (HOSPITAL_BASED_OUTPATIENT_CLINIC_OR_DEPARTMENT_OTHER): Admitting: Nurse Practitioner

## 2024-02-26 DIAGNOSIS — F419 Anxiety disorder, unspecified: Secondary | ICD-10-CM | POA: Diagnosis not present

## 2024-02-26 DIAGNOSIS — G894 Chronic pain syndrome: Secondary | ICD-10-CM | POA: Diagnosis not present

## 2024-02-26 DIAGNOSIS — R1311 Dysphagia, oral phase: Secondary | ICD-10-CM | POA: Diagnosis not present

## 2024-02-26 DIAGNOSIS — J449 Chronic obstructive pulmonary disease, unspecified: Secondary | ICD-10-CM | POA: Diagnosis not present

## 2024-02-26 DIAGNOSIS — R2681 Unsteadiness on feet: Secondary | ICD-10-CM | POA: Diagnosis not present

## 2024-02-26 DIAGNOSIS — R1314 Dysphagia, pharyngoesophageal phase: Secondary | ICD-10-CM | POA: Diagnosis not present

## 2024-02-26 DIAGNOSIS — I480 Paroxysmal atrial fibrillation: Secondary | ICD-10-CM | POA: Diagnosis not present

## 2024-02-26 DIAGNOSIS — L03116 Cellulitis of left lower limb: Secondary | ICD-10-CM | POA: Diagnosis not present

## 2024-02-26 DIAGNOSIS — M6281 Muscle weakness (generalized): Secondary | ICD-10-CM | POA: Diagnosis not present

## 2024-03-02 ENCOUNTER — Encounter

## 2024-03-03 DIAGNOSIS — I83028 Varicose veins of left lower extremity with ulcer other part of lower leg: Secondary | ICD-10-CM | POA: Diagnosis not present

## 2024-03-03 DIAGNOSIS — L89893 Pressure ulcer of other site, stage 3: Secondary | ICD-10-CM | POA: Diagnosis not present

## 2024-03-03 DIAGNOSIS — G4700 Insomnia, unspecified: Secondary | ICD-10-CM | POA: Diagnosis not present

## 2024-03-03 DIAGNOSIS — S71002A Unspecified open wound, left hip, initial encounter: Secondary | ICD-10-CM | POA: Diagnosis not present

## 2024-03-03 DIAGNOSIS — F32A Depression, unspecified: Secondary | ICD-10-CM | POA: Diagnosis not present

## 2024-03-04 ENCOUNTER — Telehealth: Payer: Self-pay

## 2024-03-04 NOTE — Telephone Encounter (Signed)
 ICM call to patient and requested manual remote transmission to review fluid levels.  He stated he is being discharged from rehab tomorrow and has already sent the device monitor home.  He is feeling fine and does not think he has any fluid.  Advised will be out of the office starting 5/22 and return on 5/27.  He will call Dr Charline Contras office if he has any fluid problems this week. He will send a manual remote transmission on 5/27.

## 2024-03-05 DIAGNOSIS — K219 Gastro-esophageal reflux disease without esophagitis: Secondary | ICD-10-CM | POA: Diagnosis not present

## 2024-03-05 DIAGNOSIS — E119 Type 2 diabetes mellitus without complications: Secondary | ICD-10-CM | POA: Diagnosis not present

## 2024-03-05 DIAGNOSIS — J449 Chronic obstructive pulmonary disease, unspecified: Secondary | ICD-10-CM | POA: Diagnosis not present

## 2024-03-05 DIAGNOSIS — I255 Ischemic cardiomyopathy: Secondary | ICD-10-CM | POA: Diagnosis not present

## 2024-03-05 DIAGNOSIS — G894 Chronic pain syndrome: Secondary | ICD-10-CM | POA: Diagnosis not present

## 2024-03-05 DIAGNOSIS — F32A Depression, unspecified: Secondary | ICD-10-CM | POA: Diagnosis not present

## 2024-03-06 ENCOUNTER — Telehealth: Payer: Self-pay | Admitting: *Deleted

## 2024-03-06 DIAGNOSIS — L03116 Cellulitis of left lower limb: Secondary | ICD-10-CM | POA: Diagnosis not present

## 2024-03-10 ENCOUNTER — Encounter

## 2024-03-10 NOTE — Addendum Note (Signed)
 Addended by: Lott Rouleau A on: 03/10/2024 08:54 AM   Modules accepted: Orders

## 2024-03-10 NOTE — Progress Notes (Signed)
 Remote ICD transmission.

## 2024-03-11 ENCOUNTER — Telehealth: Payer: Self-pay | Admitting: Internal Medicine

## 2024-03-11 NOTE — Telephone Encounter (Signed)
 Copied from CRM [id number]. Topic: General - Other >> Mar 10, 2024  4:15 PM [place] E wrote: Reason for CRM: Pam from [facility] called in stating that the patient refused his skilled nursing assessment over the weekend. Pam stated that the next available date for start of care would be 5/31. Callback number for Pam is [phone] (secure line).

## 2024-03-12 ENCOUNTER — Inpatient Hospital Stay: Admitting: Internal Medicine

## 2024-03-12 ENCOUNTER — Other Ambulatory Visit: Payer: Self-pay | Admitting: Internal Medicine

## 2024-03-12 NOTE — Progress Notes (Signed)
 No ICM remote transmission received for 03/10/2024 and next ICM transmission scheduled for 03/23/2024.

## 2024-03-13 ENCOUNTER — Ambulatory Visit: Admitting: Internal Medicine

## 2024-03-13 MED ORDER — LOSARTAN POTASSIUM 25 MG PO TABS: 25.0000 mg | ORAL_TABLET | Freq: Every day | ORAL | 2 refills | Status: DC

## 2024-03-15 ENCOUNTER — Other Ambulatory Visit (HOSPITAL_BASED_OUTPATIENT_CLINIC_OR_DEPARTMENT_OTHER): Payer: Self-pay | Admitting: Pulmonary Disease

## 2024-03-17 ENCOUNTER — Telehealth: Payer: Self-pay | Admitting: Family Medicine

## 2024-03-18 ENCOUNTER — Other Ambulatory Visit: Payer: Self-pay | Admitting: *Deleted

## 2024-03-18 MED ORDER — ZOLPIDEM TARTRATE 5 MG PO TABS: 5.0000 mg | ORAL_TABLET | Freq: Every evening | ORAL | 1 refills | Status: DC | PRN

## 2024-03-18 NOTE — Patient Outreach (Signed)
 Follow up phone call to patient to complete initial assessment. Patient declined CCM services at this time.  Patient encouraged to contact this social worker with any community resource needs that may arise in the future.Michaelle Adolphus, LCSW [facility]  [facility], [facility], [facility] Licensed Clinical Social Worker Care Coordinator  Direct Dial: [phone]

## 2024-03-18 NOTE — Telephone Encounter (Signed)
 Copied from CRM [id number]. Topic: Clinical - Medication Question >> Mar 17, 2024  4:26 PM Shereese L wrote: Reason for CRM: patient is requesting a call back in reference to a medication that was stopped. He said Ambien  is needed and that he only has a few left

## 2024-03-18 NOTE — Telephone Encounter (Signed)
 Ok done erx

## 2024-03-20 ENCOUNTER — Ambulatory Visit: Admitting: Physician Assistant

## 2024-03-20 ENCOUNTER — Telehealth: Admitting: Internal Medicine

## 2024-03-20 ENCOUNTER — Telehealth: Payer: Self-pay | Admitting: Internal Medicine

## 2024-03-20 ENCOUNTER — Encounter: Payer: Self-pay | Admitting: Internal Medicine

## 2024-03-20 DIAGNOSIS — Z7985 Long-term (current) use of injectable non-insulin antidiabetic drugs: Secondary | ICD-10-CM | POA: Diagnosis not present

## 2024-03-20 DIAGNOSIS — M1711 Unilateral primary osteoarthritis, right knee: Secondary | ICD-10-CM | POA: Diagnosis not present

## 2024-03-20 DIAGNOSIS — K5900 Constipation, unspecified: Secondary | ICD-10-CM

## 2024-03-20 DIAGNOSIS — E1165 Type 2 diabetes mellitus with hyperglycemia: Secondary | ICD-10-CM

## 2024-03-20 MED ORDER — LINACLOTIDE 145 MCG PO CAPS: 145.0000 ug | ORAL_CAPSULE | Freq: Every day | ORAL | 2 refills | Status: DC

## 2024-03-20 MED ORDER — TRAMADOL HCL 50 MG PO TABS: 50.0000 mg | ORAL_TABLET | Freq: Three times a day (TID) | ORAL | 2 refills | Status: DC | PRN

## 2024-03-20 MED ORDER — TIRZEPATIDE 7.5 MG/0.5ML ~~LOC~~ SOAJ: 7.5000 mg | SUBCUTANEOUS | 3 refills | Status: DC

## 2024-03-20 NOTE — Assessment & Plan Note (Signed)
 With chronic pain, for tramadol  refill, f/u ortho as planned

## 2024-03-20 NOTE — Progress Notes (Signed)
 Patient ID: [name], male   DOB: 03/24/1956, 68 y.o.   MRN: [id number]  Virtual Visit via Video Note  I connected with [name] on 03/20/24 at  1:40 PM EDT by a video enabled telemedicine application and verified that I am speaking with the correct person using two identifiers.  Location of all participants today Patient: at home Provider: at office   I discussed the limitations of evaluation and management by telemedicine and the availability of in person appointments. The patient expressed understanding and agreed to proceed.  History of Present Illness: Here to f/u recent hospn, unfortunately with worsening 1 wk constipation after home after rehab stay.  Had crampy abd pains, but no n/v, fever, blood.  Pt denies chest pain, increased sob or doe, wheezing, orthopnea, PND, increased LE swelling, palpitations, dizziness or syncope.   Pt denies polydipsia, polyuria, or new focal neuro s/s.   Needs tramadol  refill for chronic pain to knees and back.   Past Medical History:  Diagnosis Date   AICD (automatic cardioverter/defibrillator) present    Anemia    supposed to be taking Vit B but doesn't   ANXIETY    takes Xanax  nightly   Arthritis    Asthma    Albuterol  prn and Advair  daily;also takes Prednisone  daily   Cardiomyopathy [facility])    a. EF 25% TEE July 2013; b. EF normalized 2015;  c. 03/2015 Echo: EF 40-45%, difrf HK, PASP 38 mmHg, Mild MR, sev LAE/RAE.   Chronic constipation    takes OTC stool softener   COPD (chronic obstructive pulmonary disease) (HCC)    "one dr says COPD; one dr says emphysema" (09/18/2017)   COVID-19 12/09/2019   DEPRESSION    takes Zoloft  and Doxepin  daily   Diverticulitis    DYSKINESIA, ESOPHAGUS    Essential hypertension        FIBROMYALGIA    GERD (gastroesophageal reflux disease)        Glaucoma    HYPERLIPIDEMIA    a. Intolerant to statins.   INSOMNIA    takes Ambien  nightly   Myocardial infarction [facility])    a. 2012 Myoview  notable for  prior infarct;  b. 03/2015 Lexiscan  CL: EF 37%, diff HK, small area of inferior infarct from apex to base-->Med Rx.   O2 dependent    "2.5L q hs & prn" (09/18/2017)   Paroxysmal atrial fibrillation (HCC)    a. CHA2DS2VASc = 3--> takes Coumadin ;  b. 03/15/2015 Successful TEE/DCCV;  c. 03/2015 recurrent afib, Amio d/c'd in setting of hyperthyroidism.   Peripheral neuropathy    Pneumonia 12/2016   Rash and other nonspecific skin eruption 04/12/2009   no cause found saw dermatologists x 2 and allergist   SLEEP APNEA, OBSTRUCTIVE    a. doesn't use CPAP   Syncope    a. 03/2015 s/p MDT LINQ.   Type II diabetes mellitus (HCC)        Past Surgical History:  Procedure Laterality Date   ACNE CYST REMOVAL     2 on back    AV NODE ABLATION N/A 10/25/2017   Procedure: AV NODE ABLATION;  Surgeon: Verona Goodwill, MD;  Location: [facility] INVASIVE CV LAB;  Service: Cardiovascular;  Laterality: N/A;   BIV ICD INSERTION CRT-D N/A 09/18/2017   Procedure: BIV ICD INSERTION CRT-D;  Surgeon: Verona Goodwill, MD;  Location: [facility] INVASIVE CV LAB;  Service: Cardiovascular;  Laterality: N/A;   CARDIAC CATHETERIZATION N/A 03/21/2016   Procedure: Right/Left Heart Cath and Coronary  Angiography;  Surgeon: Darlis Eisenmenger, MD;  Location: [facility] INVASIVE CV LAB;  Service: Cardiovascular;  Laterality: N/A;   CARDIOVERSION  04/18/2012   Procedure: CARDIOVERSION;  Surgeon: Elmyra Haggard, MD;  Location: [facility] OR;  Service: Cardiovascular;  Laterality: N/A;   CARDIOVERSION  04/25/2012   Procedure: CARDIOVERSION;  Surgeon: Lake Pilgrim, MD;  Location: [facility] ENDOSCOPY;  Service: Cardiovascular;  Laterality: N/A;   CARDIOVERSION  04/25/2012   Procedure: CARDIOVERSION;  Surgeon: Elmyra Haggard, MD;  Location: [facility] OR;  Service: Cardiovascular;  Laterality: N/A;   CARDIOVERSION  05/09/2012   Procedure: CARDIOVERSION;  Surgeon: Arnoldo Lapping, MD;  Location: [facility] OR;  Service: Cardiovascular;  Laterality: N/A;  changed from crenshaw to cooper by trish/leone-endo    CARDIOVERSION N/A 03/15/2015   Procedure: CARDIOVERSION;  Surgeon: Lake Pilgrim, MD;  Location: [facility] ENDOSCOPY;  Service: Cardiovascular;  Laterality: N/A;   COLONOSCOPY     COLONOSCOPY WITH PROPOFOL  N/A 10/21/2014   Procedure: COLONOSCOPY WITH PROPOFOL ;  Surgeon: Asencion Blacksmith, MD;  Location: [facility] ENDOSCOPY;  Service: Endoscopy;  Laterality: N/A;   EP IMPLANTABLE DEVICE N/A 04/06/2015   Procedure: Loop Recorder Insertion;  Surgeon: Tammie Fall, MD;  Location: [facility] INVASIVE CV LAB;  Service: Cardiovascular;  Laterality: N/A;   ESOPHAGOGASTRODUODENOSCOPY     JOINT REPLACEMENT     LOOP RECORDER REMOVAL N/A 09/18/2017   Procedure: LOOP RECORDER REMOVAL;  Surgeon: Verona Goodwill, MD;  Location: [facility] INVASIVE CV LAB;  Service: Cardiovascular;  Laterality: N/A;   RIGHT/LEFT HEART CATH AND CORONARY ANGIOGRAPHY N/A 01/28/2017   Procedure: Right/Left Heart Cath and Coronary Angiography;  Surgeon: Darlis Eisenmenger, MD;  Location: [facility] INVASIVE CV LAB;  Service: Cardiovascular;  Laterality: N/A;   TEE WITHOUT CARDIOVERSION  04/25/2012   Procedure: TRANSESOPHAGEAL ECHOCARDIOGRAM (TEE);  Surgeon: Lake Pilgrim, MD;  Location: [facility] ENDOSCOPY;  Service: Cardiovascular;  Laterality: N/A;   TEE WITHOUT CARDIOVERSION N/A 03/15/2015   Procedure: TRANSESOPHAGEAL ECHOCARDIOGRAM (TEE);  Surgeon: Lake Pilgrim, MD;  Location: [facility] ENDOSCOPY;  Service: Cardiovascular;  Laterality: N/A;   TONSILLECTOMY AND ADENOIDECTOMY     TOTAL KNEE ARTHROPLASTY Right 06/15/2014   Procedure: TOTAL KNEE ARTHROPLASTY;  Surgeon: Saundra Curl, MD;  Location: [facility] OR;  Service: Orthopedics;  Laterality: Right;   TOTAL KNEE ARTHROPLASTY Left 10/17/2021   Procedure: Left TOTAL KNEE ARTHROPLASTY;  Surgeon: Arnie Lao, MD;  Location: [facility] OR;  Service: Orthopedics;  Laterality: Left;    reports that he quit smoking about 16 years ago. His smoking use included cigarettes. He started smoking about 46 years ago. He has a 60 pack-year smoking  history. He has never used smokeless tobacco. He reports that he does not drink alcohol and does not use drugs. family history includes Allergies in his mother; Asthma in his maternal grandmother and mother; COPD in his mother; Colon polyps in his mother; Hypothyroidism in his mother. Allergies  Allergen Reactions   Tape Other (See Comments)    SKIN TEARS VERY, VERY EASILY!!!! Use Paper Tape Only, PLEASE!!!   Amiodarone  Other (See Comments)    Caused hyperthyroidism    Statins Other (See Comments)    Myalgias    Sulfa  Antibiotics Other (See Comments)    Mouth ulcers   Wound Dressing Adhesive Other (See Comments)    Skin Tears Use Paper Tape Only   Current Outpatient Medications on File Prior to Visit  Medication Sig Dispense Refill   albuterol  (VENTOLIN  HFA) 108 (90 Base) MCG/ACT inhaler INHALE 1-2  PUFFS BY MOUTH EVERY 6 HOURS AS NEEDED FOR WHEEZE OR SHORTNESS OF BREATH 8.5 each 5   ALPRAZolam  (XANAX ) 0.5 MG tablet Take 1 tablet (0.5 mg total) by mouth at bedtime. 30 tablet 0   ammonium lactate  (AMLACTIN) 12 % cream Apply 1 Application topically as needed for dry skin. 385 g 3   apixaban  (ELIQUIS ) 5 MG TABS tablet Take 1 tablet (5 mg total) by mouth 2 (two) times daily. 60 tablet 11   ascorbic acid (VITAMIN C) 500 MG tablet Take 500 mg by mouth daily.     augmented betamethasone  dipropionate (DIPROLENE -AF) 0.05 % cream Apply 1 application  topically daily as needed (to affected areas- for itching).     carisoprodol  (SOMA ) 350 MG tablet Take 1 tablet (350 mg total) by mouth 4 (four) times daily as needed for muscle spasms. 120 tablet 0   Cholecalciferol  (VITAMIN D3) 50 MCG (2000 UT) TABS Take 2,000 Units by mouth daily.     Cyanocobalamin  (VITAMIN B-12) 2500 MCG SUBL Place 2,500 mcg under the tongue daily.     DULoxetine  (CYMBALTA ) 60 MG capsule TAKE 1 CAPSULE BY MOUTH EVERY DAY 90 capsule 2   Dupilumab (DUPIXENT) 300 MG/2ML SOPN Inject 300 mg into the skin every 14 (fourteen) days.      Evolocumab  (REPATHA  SURECLICK) 140 MG/ML SOAJ INJECT 140 MG INTO THE SKIN EVERY 14 DAYS 6 mL 0   fluticasone  (FLONASE ) 50 MCG/ACT nasal spray Place 2 sprays into both nostrils daily as needed for allergies or rhinitis. 48 mL 1   gabapentin  (NEURONTIN ) 100 MG capsule Take 1 capsule (100 mg total) by mouth 3 (three) times daily. 90 capsule 3   ipratropium-albuterol  (DUONEB) 0.5-2.5 (3) MG/3ML SOLN TAKE 3 ML BY NEBULIZATION EVERY 4 HOURS AS NEEDED (WHEEZING). 360 mL 0   leptospermum manuka honey (MEDIHONEY) PSTE paste Apply 1 Application topically daily. 44 mL 2   losartan  (COZAAR ) 25 MG tablet Take 1 tablet (25 mg total) by mouth daily. 90 tablet 2   metoprolol  tartrate (LOPRESSOR ) 25 MG tablet Take 0.5 tablets (12.5 mg total) by mouth 2 (two) times daily.     Multiple Vitamins-Minerals (SENIOR VITES PO) Take 1 tablet by mouth daily with breakfast.     omeprazole  (PRILOSEC) 20 MG capsule TAKE 1 CAPSULE BY MOUTH TWICE A DAY 180 capsule 1   predniSONE  (DELTASONE ) 10 MG tablet TAKE 1 TABLET (10 MG TOTAL) BY MOUTH DAILY WITH BREAKFAST. 30 tablet 0   tamsulosin  (FLOMAX ) 0.4 MG CAPS capsule TAKE 1 CAPSULE BY MOUTH EVERY DAY 90 capsule 3   Tiotropium Bromide  Monohydrate (SPIRIVA  RESPIMAT) 2.5 MCG/ACT AERS Take 2 puffs by nebulization daily. (Patient taking differently: Inhale 2 puffs into the lungs daily.) 4 g 3   torsemide  (DEMADEX ) 20 MG tablet Take 2 tablets (40 mg total) by mouth daily.     TUMS 500 MG chewable tablet Chew 1 tablet by mouth 2 (two) times daily as needed for indigestion or heartburn.     TYLENOL  500 MG tablet Take 2,000 mg by mouth in the morning and at bedtime.     venlafaxine  XR (EFFEXOR -XR) 150 MG 24 hr capsule TAKE 1 CAPSULE BY MOUTH DAILY WITH BREAKFAST. 90 capsule 3   WIXELA INHUB 250-50 MCG/ACT AEPB INHALE 1 PUFF INTO THE LUNGS IN THE MORNING AND AT BEDTIME. 60 each 8   zolpidem  (AMBIEN ) 5 MG tablet Take 1 tablet (5 mg total) by mouth at bedtime as needed for sleep. 90 tablet 1    No  current facility-administered medications on file prior to visit.    Observations/Objective: Alert, NAD, appropriate mood and affect, resps normal, cn 2-12 intact, moves all 4s, no visible rash or swelling Lab Results  Component Value Date   WBC 11.5 (H) 02/13/2024   HGB 9.9 (L) 02/13/2024   HCT 32.3 (L) 02/13/2024   PLT 298 02/13/2024   GLUCOSE 95 02/13/2024   CHOL 130 01/07/2024   TRIG 139.0 01/07/2024   HDL 52.40 01/07/2024   LDLDIRECT 72.0 02/28/2021   LDLCALC 49 01/07/2024   ALT 28 02/06/2024   AST 25 02/06/2024   NA 139 02/13/2024   K 5.0 02/13/2024   CL 101 02/13/2024   CREATININE 0.97 02/13/2024   BUN 23 02/13/2024   CO2 28 02/13/2024   TSH 2.02 01/07/2024   PSA 3.46 01/07/2024   INR 1.2 01/24/2024   HGBA1C 6.0 01/07/2024   MICROALBUR <0.7 01/07/2024   Assessment and Plan: See notes  Follow Up Instructions: See notes   I discussed the assessment and treatment plan with the patient. The patient was provided an opportunity to ask questions and all were answered. The patient agreed with the plan and demonstrated an understanding of the instructions.   The patient was advised to call back or seek an in-person evaluation if the symptoms worsen or if the condition fails to improve as anticipated   Rosalia Colonel, MD

## 2024-03-20 NOTE — Telephone Encounter (Signed)
 Copied from CRM [id number]. Topic: Clinical - Medication Question >> Mar 19, 2024  4:50 PM Sophia H wrote: Reason for CRM: Patient is requesting a refill on tramadol , I do not see in chart. Please advise, best contact # [phone]  [id number] [place], [place] - [address]

## 2024-03-20 NOTE — Patient Instructions (Signed)
 Ok to increase the mounjaro to 7.5 mg weekly  Please take all new medication as prescribed -the linzess  as needed for constipation  Please continue all other medications as before, and refills have been done for tramadol   Please have the pharmacy call with any other refills you may need..  Please keep your appointments with your specialists as you may have planned

## 2024-03-20 NOTE — Assessment & Plan Note (Signed)
 Lab Results  Component Value Date   HGBA1C 6.0 01/07/2024   Stable, pt to continue current medical treatment mounjaro 7.5 mg weekly

## 2024-03-20 NOTE — Telephone Encounter (Signed)
 Done erx

## 2024-03-20 NOTE — Assessment & Plan Note (Signed)
 Recent worsening, despite miralax , for linzess  145 mg every day prn,  to f/u any worsening symptoms or concerns

## 2024-03-23 ENCOUNTER — Ambulatory Visit: Attending: Internal Medicine

## 2024-03-23 ENCOUNTER — Telehealth: Payer: Self-pay

## 2024-03-23 DIAGNOSIS — Z95810 Presence of automatic (implantable) cardiac defibrillator: Secondary | ICD-10-CM | POA: Diagnosis not present

## 2024-03-23 DIAGNOSIS — I5022 Chronic systolic (congestive) heart failure: Secondary | ICD-10-CM

## 2024-03-23 NOTE — Telephone Encounter (Signed)
 Attempted call to patient and left message requesting to send remote transmission for review.  Left call back number.

## 2024-03-23 NOTE — Progress Notes (Unsigned)
 EPIC Encounter for ICM Monitoring  Patient Name: [name] is a 68 y.o. male Date: 03/23/2024 Primary Care Physican: Roslyn Coombe, MD Primary Cardiologist: Mitzie Anda Electrophysiologist: Victorino Grates Pacing: 98.3%    11/04/2023 Office Weight: 285 lbs 03/24/2024 Weight: Unable to weigh due to balance issues (was 267 lbs while at rehab)   Battery Longevity: 7 months     Spoke with patient and heart failure questions reviewed.  Transmission results reviewed.  Pt reports swelling in legs and hands.   He has returned home after a [facility] 4/24 followed by a 30 day rehab stay.   Optivol thoracic impedance suggesting possible fluid accumulation starting 4/12.   Prescribed:  Torsemide  20 mg 2 tablet(s) (40 mg total) by mouth daily.  Pt reports 6/10 that he has been taking Torsemide  40 mg twice a day instead of daily as prescribed.   Labs: 02/13/2024 Creatinine 0.97, BUN 23, Potassium 5.0, Sodium 139, GFR >60  02/12/2024 Creatinine 1.0,   BUN 31, Potassium 4.9, Sodium 137, GFR >60  02/11/2024 Creatinine 1.04, BUN 39, Potassium 4.4, Sodium 137, GFR >60  02/10/2024 Creatinine 1.60, BUN 56, Potassium 4.7, Sodium 135, GFR 46 02/09/2024 Creatinine 2.27, BUN 64, Potassium 4.1, Sodium 131, GFR 30  02/08/2024 Creatinine 2.93, BUN 66, Potassium 4.4, Sodium 129, GFR 22 (5:23 PM) 02/08/2024 Creatinine 3.19, BUN 60, Potassium 4.6, Sodium 129, GFR 20 02/07/2024 Creatinine 2.77, BUN 41, Potassium 4.8, Sodium 135, GFR 24  A complete set of results can be found in Results Review.   Recommendations:    Copy sent to Dr Mitzie Anda for review and recommendation if needed.     Follow-up plan: ICM clinic phone appointment on 03/30/2024 to recheck fluid levels.   91 day device clinic remote transmission 04/22/2024.   EP/Cardiology Office Visits:    Recall 12/07/2024 with Dr Rodolfo Clan.   Last HF clinic was 02/03/2024 and no further appts scheduled.   Copy of ICM check sent to Dr. Rodolfo Clan.   3 month ICM trend:  03/23/2024.    12-14 Month ICM trend:     Almyra Jain, RN 03/23/2024 3:02 PM

## 2024-03-24 ENCOUNTER — Encounter: Payer: Self-pay | Admitting: Family Medicine

## 2024-03-24 ENCOUNTER — Ambulatory Visit: Admitting: Family Medicine

## 2024-03-24 VITALS — BP 108/74 | HR 71 | Ht 70.0 in

## 2024-03-24 DIAGNOSIS — M19012 Primary osteoarthritis, left shoulder: Secondary | ICD-10-CM | POA: Diagnosis not present

## 2024-03-24 DIAGNOSIS — M19011 Primary osteoarthritis, right shoulder: Secondary | ICD-10-CM | POA: Diagnosis not present

## 2024-03-24 DIAGNOSIS — M12811 Other specific arthropathies, not elsewhere classified, right shoulder: Secondary | ICD-10-CM

## 2024-03-24 MED ORDER — TORSEMIDE 20 MG PO TABS: 60.0000 mg | ORAL_TABLET | Freq: Two times a day (BID) | ORAL | 2 refills | Status: DC

## 2024-03-24 NOTE — Progress Notes (Signed)
 [facility] [address] [place] [address] Phone: [phone] Subjective:    I'm seeing this patient by the request  of:  Roslyn Coombe, MD  CC: Bilateral shoulder pain with  [id number]  01/22/2024 Bilateral injections given again today.  Due to comorbidities there is not many other potential treatment options for this individual.  Social determinants of health including patient has some housing instability and physically inactive secondary to other orthopedic issues as well as patient's torticollis.  We will continue to monitor patient.  Can repeat injections every 8 to 10 weeks for palliative treatment of the shoulders.     Updated 03/24/2024 [name] is a 68 y.o. male coming in with complaint of B shoulder pain. Shoulder injections today. Wants to know about antibiotics for cellulitis.       Past Medical History:  Diagnosis Date   AICD (automatic cardioverter/defibrillator) present    Anemia    supposed to be taking Vit B but doesn't   ANXIETY    takes Xanax  nightly   Arthritis    Asthma    Albuterol  prn and Advair  daily;also takes Prednisone  daily   Cardiomyopathy [facility])    a. EF 25% TEE July 2013; b. EF normalized 2015;  c. 03/2015 Echo: EF 40-45%, difrf HK, PASP 38 mmHg, Mild MR, sev LAE/RAE.   Chronic constipation    takes OTC stool softener   COPD (chronic obstructive pulmonary disease) (HCC)    "one dr says COPD; one dr says emphysema" (09/18/2017)   COVID-19 12/09/2019   DEPRESSION    takes Zoloft  and Doxepin  daily   Diverticulitis    DYSKINESIA, ESOPHAGUS    Essential hypertension        FIBROMYALGIA    GERD (gastroesophageal reflux disease)        Glaucoma    HYPERLIPIDEMIA    a. Intolerant to statins.   INSOMNIA    takes Ambien  nightly   Myocardial infarction [facility])    a. 2012 Myoview  notable for prior infarct;  b. 03/2015 Lexiscan  CL: EF 37%, diff HK, small area of inferior infarct from apex to base-->Med Rx.    O2 dependent    "2.5L q hs & prn" (09/18/2017)   Paroxysmal atrial fibrillation (HCC)    a. CHA2DS2VASc = 3--> takes Coumadin ;  b. 03/15/2015 Successful TEE/DCCV;  c. 03/2015 recurrent afib, Amio d/c'd in setting of hyperthyroidism.   Peripheral neuropathy    Pneumonia 12/2016   Rash and other nonspecific skin eruption 04/12/2009   no cause found saw dermatologists x 2 and allergist   SLEEP APNEA, OBSTRUCTIVE    a. doesn't use CPAP   Syncope    a. 03/2015 s/p MDT LINQ.   Type II diabetes mellitus (HCC)        Past Surgical History:  Procedure Laterality Date   ACNE CYST REMOVAL     2 on back    AV NODE ABLATION N/A 10/25/2017   Procedure: AV NODE ABLATION;  Surgeon: Verona Goodwill, MD;  Location: [facility] INVASIVE CV LAB;  Service: Cardiovascular;  Laterality: N/A;   BIV ICD INSERTION CRT-D N/A 09/18/2017   Procedure: BIV ICD INSERTION CRT-D;  Surgeon: Verona Goodwill, MD;  Location: [facility] INVASIVE CV LAB;  Service: Cardiovascular;  Laterality: N/A;   CARDIAC CATHETERIZATION N/A 03/21/2016   Procedure: Right/Left Heart Cath and Coronary Angiography;  Surgeon: Darlis Eisenmenger, MD;  Location: [facility] INVASIVE CV LAB;  Service: Cardiovascular;  Laterality: N/A;   CARDIOVERSION  04/18/2012   Procedure: CARDIOVERSION;  Surgeon: Elmyra Haggard, MD;  Location: [facility] OR;  Service: Cardiovascular;  Laterality: N/A;   CARDIOVERSION  04/25/2012   Procedure: CARDIOVERSION;  Surgeon: Lake Pilgrim, MD;  Location: [facility] ENDOSCOPY;  Service: Cardiovascular;  Laterality: N/A;   CARDIOVERSION  04/25/2012   Procedure: CARDIOVERSION;  Surgeon: Elmyra Haggard, MD;  Location: [facility] OR;  Service: Cardiovascular;  Laterality: N/A;   CARDIOVERSION  05/09/2012   Procedure: CARDIOVERSION;  Surgeon: Arnoldo Lapping, MD;  Location: [facility] OR;  Service: Cardiovascular;  Laterality: N/A;  changed from crenshaw to cooper by trish/leone-endo   CARDIOVERSION N/A 03/15/2015   Procedure: CARDIOVERSION;  Surgeon: Lake Pilgrim, MD;  Location: [facility]  ENDOSCOPY;  Service: Cardiovascular;  Laterality: N/A;   COLONOSCOPY     COLONOSCOPY WITH PROPOFOL  N/A 10/21/2014   Procedure: COLONOSCOPY WITH PROPOFOL ;  Surgeon: Asencion Blacksmith, MD;  Location: [facility] ENDOSCOPY;  Service: Endoscopy;  Laterality: N/A;   EP IMPLANTABLE DEVICE N/A 04/06/2015   Procedure: Loop Recorder Insertion;  Surgeon: Tammie Fall, MD;  Location: [facility] INVASIVE CV LAB;  Service: Cardiovascular;  Laterality: N/A;   ESOPHAGOGASTRODUODENOSCOPY     JOINT REPLACEMENT     LOOP RECORDER REMOVAL N/A 09/18/2017   Procedure: LOOP RECORDER REMOVAL;  Surgeon: Verona Goodwill, MD;  Location: [facility] INVASIVE CV LAB;  Service: Cardiovascular;  Laterality: N/A;   RIGHT/LEFT HEART CATH AND CORONARY ANGIOGRAPHY N/A 01/28/2017   Procedure: Right/Left Heart Cath and Coronary Angiography;  Surgeon: Darlis Eisenmenger, MD;  Location: [facility] INVASIVE CV LAB;  Service: Cardiovascular;  Laterality: N/A;   TEE WITHOUT CARDIOVERSION  04/25/2012   Procedure: TRANSESOPHAGEAL ECHOCARDIOGRAM (TEE);  Surgeon: Lake Pilgrim, MD;  Location: [facility] ENDOSCOPY;  Service: Cardiovascular;  Laterality: N/A;   TEE WITHOUT CARDIOVERSION N/A 03/15/2015   Procedure: TRANSESOPHAGEAL ECHOCARDIOGRAM (TEE);  Surgeon: Lake Pilgrim, MD;  Location: [facility] ENDOSCOPY;  Service: Cardiovascular;  Laterality: N/A;   TONSILLECTOMY AND ADENOIDECTOMY     TOTAL KNEE ARTHROPLASTY Right 06/15/2014   Procedure: TOTAL KNEE ARTHROPLASTY;  Surgeon: Saundra Curl, MD;  Location: [facility] OR;  Service: Orthopedics;  Laterality: Right;   TOTAL KNEE ARTHROPLASTY Left 10/17/2021   Procedure: Left TOTAL KNEE ARTHROPLASTY;  Surgeon: Arnie Lao, MD;  Location: [facility] OR;  Service: Orthopedics;  Laterality: Left;   Social History   Socioeconomic History   Marital status: Significant Other    Spouse name: Not on file   Number of children: 2   Years of education: Not on file   Highest education level: GED or equivalent  Occupational History   Occupation:  retired/disabled. prev worked in Teacher, English as a foreign language.    Employer: DISABLED  Tobacco Use   Smoking status: Former    Current packs/day: 0.00    Average packs/day: 2.0 packs/day for 30.0 years (60.0 ttl pk-yrs)    Types: Cigarettes    Start date: 10/15/1977    Quit date: 10/16/2007    Years since quitting: 16.4   Smokeless tobacco: Never  Vaping Use   Vaping status: Never Used  Substance and Sexual Activity   Alcohol use: No   Drug use: No   Sexual activity: Not Currently  Other Topics Concern   Not on file  Social History Narrative   Lives with girlfriend   Social Drivers of Health   Financial Resource Strain: Medium Risk (01/01/2024)   Overall Financial Resource Strain (CARDIA)    Difficulty of Paying Living Expenses: Somewhat hard  Food Insecurity: No Food Insecurity (  02/09/2024)   Hunger Vital Sign    Worried About Running Out of Food in the Last Year: Never true    Ran Out of Food in the Last Year: Never true  Transportation Needs: No Transportation Needs (02/09/2024)   PRAPARE - Administrator, Civil Service (Medical): No    Lack of Transportation (Non-Medical): No  Physical Activity: Inactive (01/01/2024)   Exercise Vital Sign    Days of Exercise per Week: 0 days    Minutes of Exercise per Session: 0 min  Stress: No Stress Concern Present (01/01/2024)   [employer] of Occupational Health - Occupational Stress Questionnaire    Feeling of Stress : Only a little  Social Connections: Socially Isolated (02/09/2024)   Social Connection and Isolation Panel [NHANES]    Frequency of Communication with Friends and Family: Once a week    Frequency of Social Gatherings with Friends and Family: Never    Attends Religious Services: Never    Database administrator or Organizations: No    Attends Engineer, structural: Never    Marital Status: Divorced   Allergies  Allergen Reactions   Tape Other (See Comments)    SKIN TEARS VERY, VERY EASILY!!!! Use Paper  Tape Only, PLEASE!!!   Amiodarone  Other (See Comments)    Caused hyperthyroidism    Statins Other (See Comments)    Myalgias    Sulfa  Antibiotics Other (See Comments)    Mouth ulcers   Wound Dressing Adhesive Other (See Comments)    Skin Tears Use Paper Tape Only   Family History  Problem Relation Age of Onset   COPD Mother    Asthma Mother    Colon polyps Mother    Allergies Mother    Hypothyroidism Mother    Asthma Maternal Grandmother    Colon cancer Neg Hx     Current Outpatient Medications (Endocrine & Metabolic):    predniSONE  (DELTASONE ) 10 MG tablet, TAKE 1 TABLET (10 MG TOTAL) BY MOUTH DAILY WITH BREAKFAST.   tirzepatide  (MOUNJARO ) 7.5 MG/0.5ML Pen, Inject 7.5 mg into the skin once a week.  Current Outpatient Medications (Cardiovascular):    Evolocumab  (REPATHA  SURECLICK) 140 MG/ML SOAJ, INJECT 140 MG INTO THE SKIN EVERY 14 DAYS   losartan  (COZAAR ) 25 MG tablet, Take 1 tablet (25 mg total) by mouth daily.   metoprolol  tartrate (LOPRESSOR ) 25 MG tablet, Take 0.5 tablets (12.5 mg total) by mouth 2 (two) times daily.   torsemide  (DEMADEX ) 20 MG tablet, Take 2 tablets (40 mg total) by mouth daily.  Current Outpatient Medications (Respiratory):    albuterol  (VENTOLIN  HFA) 108 (90 Base) MCG/ACT inhaler, INHALE 1-2 PUFFS BY MOUTH EVERY 6 HOURS AS NEEDED FOR WHEEZE OR SHORTNESS OF BREATH   fluticasone  (FLONASE ) 50 MCG/ACT nasal spray, Place 2 sprays into both nostrils daily as needed for allergies or rhinitis.   ipratropium-albuterol  (DUONEB) 0.5-2.5 (3) MG/3ML SOLN, TAKE 3 ML BY NEBULIZATION EVERY 4 HOURS AS NEEDED (WHEEZING).   Tiotropium Bromide  Monohydrate (SPIRIVA  RESPIMAT) 2.5 MCG/ACT AERS, Take 2 puffs by nebulization daily. (Patient taking differently: Inhale 2 puffs into the lungs daily.)   WIXELA INHUB 250-50 MCG/ACT AEPB, INHALE 1 PUFF INTO THE LUNGS IN THE MORNING AND AT BEDTIME.  Current Outpatient Medications (Analgesics):    traMADol  (ULTRAM ) 50 MG tablet,  Take 1 tablet (50 mg total) by mouth every 8 (eight) hours as needed.   TYLENOL  500 MG tablet, Take 2,000 mg by mouth in the morning and at bedtime.  Current Outpatient Medications (Hematological):    apixaban  (ELIQUIS ) 5 MG TABS tablet, Take 1 tablet (5 mg total) by mouth 2 (two) times daily.   Cyanocobalamin  (VITAMIN B-12) 2500 MCG SUBL, Place 2,500 mcg under the tongue daily.  Current Outpatient Medications (Other):    ALPRAZolam  (XANAX ) 0.5 MG tablet, Take 1 tablet (0.5 mg total) by mouth at bedtime.   ammonium lactate  (AMLACTIN) 12 % cream, Apply 1 Application topically as needed for dry skin.   ascorbic acid (VITAMIN C) 500 MG tablet, Take 500 mg by mouth daily.   augmented betamethasone  dipropionate (DIPROLENE -AF) 0.05 % cream, Apply 1 application  topically daily as needed (to affected areas- for itching).   carisoprodol  (SOMA ) 350 MG tablet, Take 1 tablet (350 mg total) by mouth 4 (four) times daily as needed for muscle spasms.   Cholecalciferol  (VITAMIN D3) 50 MCG (2000 UT) TABS, Take 2,000 Units by mouth daily.   DULoxetine  (CYMBALTA ) 60 MG capsule, TAKE 1 CAPSULE BY MOUTH EVERY DAY   Dupilumab (DUPIXENT) 300 MG/2ML SOPN, Inject 300 mg into the skin every 14 (fourteen) days.   gabapentin  (NEURONTIN ) 100 MG capsule, Take 1 capsule (100 mg total) by mouth 3 (three) times daily.   leptospermum manuka honey (MEDIHONEY) PSTE paste, Apply 1 Application topically daily.   linaclotide  (LINZESS ) 145 MCG CAPS capsule, Take 1 capsule (145 mcg total) by mouth daily before breakfast.   Multiple Vitamins-Minerals (SENIOR VITES PO), Take 1 tablet by mouth daily with breakfast.   omeprazole  (PRILOSEC) 20 MG capsule, TAKE 1 CAPSULE BY MOUTH TWICE A DAY   tamsulosin  (FLOMAX ) 0.4 MG CAPS capsule, TAKE 1 CAPSULE BY MOUTH EVERY DAY   TUMS 500 MG chewable tablet, Chew 1 tablet by mouth 2 (two) times daily as needed for indigestion or heartburn.   venlafaxine  XR (EFFEXOR -XR) 150 MG 24 hr capsule, TAKE 1  CAPSULE BY MOUTH DAILY WITH BREAKFAST.   zolpidem  (AMBIEN ) 5 MG tablet, Take 1 tablet (5 mg total) by mouth at bedtime as needed for sleep.   Reviewed prior external information including notes and imaging from  primary care provider As well as notes that were available from care everywhere and other healthcare systems.  Past medical history, social, surgical and family history all reviewed in electronic medical record.  No pertanent information unless stated regarding to the chief complaint.   Review of Systems:  No headache, visual changes, nausea, vomiting, diarrhea, constipation, dizziness, abdominal pain, skin rash, fevers, chills, night sweats, weight loss, swollen lymph nodes, body aches, joint swelling, chest pain, shortness of breath, mood changes. POSITIVE muscle aches  Objective  There were no vitals taken for this visit.   General: No apparent distress alert and oriented x3 mood and affect normal, dressed appropriately.  HEENT: Pupils equal, extraocular movements intact  Patient does have arthritic changes of the shoulder bilaterally.  Seems to be fairly significant on the shoulders.  Wearing oxygen  and needs treatment.  Procedure: Real-time Ultrasound Guided Injection of right glenohumeral joint Device: GE Logiq Q7  Ultrasound guided injection is preferred based studies that show increased duration, increased effect, greater accuracy, decreased procedural pain, increased response rate with ultrasound guided versus blind injection.  Verbal informed consent obtained.  Time-out conducted.  Noted no overlying erythema, induration, or other signs of local infection.  Skin prepped in a sterile fashion.  Local anesthesia: Topical Ethyl chloride.  With sterile technique and under real time ultrasound guidance:  Joint visualized.  23g 1  inch needle inserted posterior approach. Pictures  taken for needle placement. Patient did have injection of , 2 cc of 0.5% Marcaine , and 1.0 cc of  Kenalog  40 mg/dL. Completed without difficulty  Pain immediately resolved suggesting accurate placement of the medication.  Advised to call if fevers/chills, erythema, induration, drainage, or persistent bleeding.  Images stored Impression: Technically successful ultrasound guided injection.  Procedure: Real-time Ultrasound Guided Injection of left glenohumeral joint Device: GE Logiq E  Ultrasound guided injection is preferred based studies that show increased duration, increased effect, greater accuracy, decreased procedural pain, increased response rate with ultrasound guided versus blind injection.  Verbal informed consent obtained.  Time-out conducted.  Noted no overlying erythema, induration, or other signs of local infection.  Skin prepped in a sterile fashion.  Local anesthesia: Topical Ethyl chloride.  With sterile technique and under real time ultrasound guidance:  Joint visualized.  21g 2 inch needle inserted posterior approach. Pictures taken for needle placement. Patient did have injection of 2 cc of 0.5% Marcaine , and 1cc of Kenalog  40 mg/dL. Completed without difficulty  Pain immediately resolved suggesting accurate placement of the medication.  Advised to call if fevers/chills, erythema, induration, drainage, or persistent bleeding.  Images permanently stored  Impression: Technically successful ultrasound guided injection.  Procedure: Real-time Ultrasound Guided Injection of left acromioclavicular joint Device: GE Logiq Q7 Ultrasound guided injection is preferred based studies that show increased duration, increased effect, greater accuracy, decreased procedural pain, increased response rate, and decreased cost with ultrasound guided versus blind injection.  Verbal informed consent obtained.  Time-out conducted.  Noted no overlying erythema, induration, or other signs of local infection.  Skin prepped in a sterile fashion.  Local anesthesia: Topical Ethyl chloride.  With  sterile technique and under real time ultrasound guidance: With a 25-gauge half inch needle injected with 0.5 cc of 0.5% Marcaine  and 0.5 cc of Kenalog  40 mg/mL Completed without difficulty  Pain immediately resolved suggesting accurate placement of the medication.  Advised to call if fevers/chills, erythema, induration, drainage, or persistent bleeding.  Impression: Technically successful ultrasound guided injection.  Procedure: Real-time Ultrasound Guided Injection of right acromioclavicular joint Device: GE Logiq Q7 Ultrasound guided injection is preferred based studies that show increased duration, increased effect, greater accuracy, decreased procedural pain, increased response rate, and decreased cost with ultrasound guided versus blind injection.  Verbal informed consent obtained.  Time-out conducted.  Noted no overlying erythema, induration, or other signs of local infection.  Skin prepped in a sterile fashion.  Local anesthesia: Topical Ethyl chloride.  With sterile technique and under real time ultrasound guidance: With a 25-gauge half inch needle injected with 0.5 cc of 0.5% Marcaine  and 0.5 cc of Kenalog  40 mg/mL Completed without difficulty  Pain immediately resolved suggesting accurate placement of the medication.  Advised to call if fevers/chills, erythema, induration, drainage, or persistent bleeding.  Impression: Technically successful ultrasound guided injection.   Impression and Recommendations:     The above documentation has been reviewed and is accurate and complete Galilee Pierron M Tanai Bouler, DO

## 2024-03-24 NOTE — Patient Instructions (Addendum)
 Injections in shoulders today Good to see you! See you again in 8 weeks

## 2024-03-24 NOTE — Progress Notes (Signed)
 Spoke with patient and advised Dr Mitzie Anda recommended to take Torsemide  60 mg twice a day and he verbalized understanding.  Prescription updated and sent to preferred pharmacy.  BMET ordered and scheduled for 6/18 at HF clinic.

## 2024-03-24 NOTE — Progress Notes (Signed)
 Increase torsemide  to 60 mg bid and get BMET in 1 week.

## 2024-03-24 NOTE — Assessment & Plan Note (Signed)
 Chronic problem with worsening symptoms.  Making it difficult to do activities of daily living secondary to the weakness patient has secondary to this.  Discussed which activities to do and which ones to avoid.  Increase activity slowly otherwise.  Can get repeat injections every 8 weeks if necessary.

## 2024-03-24 NOTE — Assessment & Plan Note (Signed)
 Bilateral injections given today.  Discussed icing regimen and home exercises, discussed avoiding certain activities.  Increase activity slowly.  Discussed icing regimen.  Follow-up again in 6 to 8 weeks.

## 2024-03-25 ENCOUNTER — Encounter: Payer: Self-pay | Admitting: Internal Medicine

## 2024-03-25 NOTE — Telephone Encounter (Signed)
 Called and got Pt scheduled for Mychart visit tomorrow 03/26/24.

## 2024-03-26 ENCOUNTER — Telehealth: Admitting: Family Medicine

## 2024-03-26 ENCOUNTER — Encounter: Payer: Self-pay | Admitting: Family Medicine

## 2024-03-26 DIAGNOSIS — L03116 Cellulitis of left lower limb: Secondary | ICD-10-CM | POA: Diagnosis not present

## 2024-03-26 MED ORDER — AMOXICILLIN-POT CLAVULANATE 875-125 MG PO TABS: 1.0000 | ORAL_TABLET | Freq: Two times a day (BID) | ORAL | 0 refills | Status: DC

## 2024-03-26 NOTE — Patient Instructions (Signed)
 I have sent in Augmentin for you to take twice a day for 10 days.  This medication can upset your stomach, so I tell everyone to take it with a meal.  Follow-up with me for new or worsening symptoms.

## 2024-03-26 NOTE — Progress Notes (Signed)
 Virtual Visit via Video Note  I connected with [name] on 03/26/24 at  3:00 PM EDT by a video enabled telemedicine application and verified that I am speaking with the correct person using two identifiers.  Patient Location: Home Provider Location: Office/Clinic  I discussed the limitations, risks, security, and privacy concerns of performing an evaluation and management service by video and the availability of in person appointments. I also discussed with the patient that there may be a patient responsible charge related to this service. The patient expressed understanding and agreed to proceed.  Subjective: PCP: Roslyn Coombe, MD  No chief complaint on file.  HPI 68 year old male presents for A/V visit for evaluation of possible cellulitis to his left lower leg. States this has been going on for the last the last couple of days.  States that he had a blister on his left lower leg that popped, states he thinks this came from his legs swelling. States that he is soaking through a dressing about every hour.  Reports that the fluid coming from the area is clear/yellow. Reports that the area on the leg is red and tender.  Has history of cellulitis to lower extremities. Has had cellulitis with sepsis as recent as 45 days ago. Denies abdominal pain, nausea, vomiting, diarrhea, rash, fever, chills, other concerns.   ROS: Per HPI  Current Outpatient Medications:    amoxicillin -clavulanate (AUGMENTIN ) 875-125 MG tablet, Take 1 tablet by mouth 2 (two) times daily., Disp: 20 tablet, Rfl: 0   albuterol  (VENTOLIN  HFA) 108 (90 Base) MCG/ACT inhaler, INHALE 1-2 PUFFS BY MOUTH EVERY 6 HOURS AS NEEDED FOR WHEEZE OR SHORTNESS OF BREATH, Disp: 8.5 each, Rfl: 5   ALPRAZolam  (XANAX ) 0.5 MG tablet, Take 1 tablet (0.5 mg total) by mouth at bedtime., Disp: 30 tablet, Rfl: 0   ammonium lactate  (AMLACTIN) 12 % cream, Apply 1 Application topically as needed for dry skin., Disp: 385 g, Rfl: 3    apixaban  (ELIQUIS ) 5 MG TABS tablet, Take 1 tablet (5 mg total) by mouth 2 (two) times daily., Disp: 60 tablet, Rfl: 11   ascorbic acid (VITAMIN C) 500 MG tablet, Take 500 mg by mouth daily., Disp: , Rfl:    augmented betamethasone  dipropionate (DIPROLENE -AF) 0.05 % cream, Apply 1 application  topically daily as needed (to affected areas- for itching)., Disp: , Rfl:    carisoprodol  (SOMA ) 350 MG tablet, Take 1 tablet (350 mg total) by mouth 4 (four) times daily as needed for muscle spasms., Disp: 120 tablet, Rfl: 0   Cholecalciferol  (VITAMIN D3) 50 MCG (2000 UT) TABS, Take 2,000 Units by mouth daily., Disp: , Rfl:    Cyanocobalamin  (VITAMIN B-12) 2500 MCG SUBL, Place 2,500 mcg under the tongue daily., Disp: , Rfl:    DULoxetine  (CYMBALTA ) 60 MG capsule, TAKE 1 CAPSULE BY MOUTH EVERY DAY, Disp: 90 capsule, Rfl: 2   Dupilumab (DUPIXENT) 300 MG/2ML SOPN, Inject 300 mg into the skin every 14 (fourteen) days., Disp: , Rfl:    Evolocumab  (REPATHA  SURECLICK) 140 MG/ML SOAJ, INJECT 140 MG INTO THE SKIN EVERY 14 DAYS, Disp: 6 mL, Rfl: 0   fluticasone  (FLONASE ) 50 MCG/ACT nasal spray, Place 2 sprays into both nostrils daily as needed for allergies or rhinitis., Disp: 48 mL, Rfl: 1   gabapentin  (NEURONTIN ) 100 MG capsule, Take 1 capsule (100 mg total) by mouth 3 (three) times daily., Disp: 90 capsule, Rfl: 3   ipratropium-albuterol  (DUONEB) 0.5-2.5 (3) MG/3ML SOLN, TAKE 3 ML BY NEBULIZATION EVERY  4 HOURS AS NEEDED (WHEEZING)., Disp: 360 mL, Rfl: 0   leptospermum manuka honey (MEDIHONEY) PSTE paste, Apply 1 Application topically daily., Disp: 44 mL, Rfl: 2   linaclotide  (LINZESS ) 145 MCG CAPS capsule, Take 1 capsule (145 mcg total) by mouth daily before breakfast., Disp: 30 capsule, Rfl: 2   losartan  (COZAAR ) 25 MG tablet, Take 1 tablet (25 mg total) by mouth daily., Disp: 90 tablet, Rfl: 2   metoprolol  tartrate (LOPRESSOR ) 25 MG tablet, Take 0.5 tablets (12.5 mg total) by mouth 2 (two) times daily., Disp: , Rfl:     Multiple Vitamins-Minerals (SENIOR VITES PO), Take 1 tablet by mouth daily with breakfast., Disp: , Rfl:    omeprazole  (PRILOSEC) 20 MG capsule, TAKE 1 CAPSULE BY MOUTH TWICE A DAY, Disp: 180 capsule, Rfl: 1   predniSONE  (DELTASONE ) 10 MG tablet, TAKE 1 TABLET (10 MG TOTAL) BY MOUTH DAILY WITH BREAKFAST., Disp: 30 tablet, Rfl: 0   tamsulosin  (FLOMAX ) 0.4 MG CAPS capsule, TAKE 1 CAPSULE BY MOUTH EVERY DAY, Disp: 90 capsule, Rfl: 3   Tiotropium Bromide  Monohydrate (SPIRIVA  RESPIMAT) 2.5 MCG/ACT AERS, Take 2 puffs by nebulization daily. (Patient taking differently: Inhale 2 puffs into the lungs daily.), Disp: 4 g, Rfl: 3   tirzepatide  (MOUNJARO ) 7.5 MG/0.5ML Pen, Inject 7.5 mg into the skin once a week., Disp: 6 mL, Rfl: 3   torsemide  (DEMADEX ) 20 MG tablet, Take 3 tablets (60 mg total) by mouth 2 (two) times daily., Disp: 540 tablet, Rfl: 2   traMADol  (ULTRAM ) 50 MG tablet, Take 1 tablet (50 mg total) by mouth every 8 (eight) hours as needed., Disp: 90 tablet, Rfl: 2   TUMS 500 MG chewable tablet, Chew 1 tablet by mouth 2 (two) times daily as needed for indigestion or heartburn., Disp: , Rfl:    TYLENOL  500 MG tablet, Take 2,000 mg by mouth in the morning and at bedtime., Disp: , Rfl:    venlafaxine  XR (EFFEXOR -XR) 150 MG 24 hr capsule, TAKE 1 CAPSULE BY MOUTH DAILY WITH BREAKFAST., Disp: 90 capsule, Rfl: 3   WIXELA INHUB 250-50 MCG/ACT AEPB, INHALE 1 PUFF INTO THE LUNGS IN THE MORNING AND AT BEDTIME., Disp: 60 each, Rfl: 8   zolpidem  (AMBIEN ) 5 MG tablet, Take 1 tablet (5 mg total) by mouth at bedtime as needed for sleep., Disp: 90 tablet, Rfl: 1  Observations/Objective: There were no vitals filed for this visit. Physical Exam Vitals and nursing note reviewed.  Constitutional:      General: He is not in acute distress. HENT:     Head: Normocephalic and atraumatic.   Eyes:     Extraocular Movements: Extraocular movements intact.   Pulmonary:     Effort: Pulmonary effort is normal.      Comments: Nasal cannula in place  Musculoskeletal:     Cervical back: Normal range of motion.   Skin:    Comments: Unable to visualize area of concern on the left lower extremity   Neurological:     General: No focal deficit present.     Mental Status: He is alert and oriented to person, place, and time.   Psychiatric:        Mood and Affect: Mood normal.        Behavior: Behavior normal.     Assessment and Plan: Cellulitis of left lower extremity -     Amoxicillin -Pot Clavulanate; Take 1 tablet by mouth 2 (two) times daily.  Dispense: 20 tablet; Refill: 0    Follow Up Instructions: Return  if symptoms worsen or fail to improve.   I discussed the assessment and treatment plan with the patient. The patient was provided an opportunity to ask questions, and all were answered. The patient agreed with the plan and demonstrated an understanding of the instructions.   The patient was advised to call back or seek an in-person evaluation if the symptoms worsen or if the condition fails to improve as anticipated.  The above assessment and management plan was discussed with the patient. The patient verbalized understanding of and has agreed to the management plan.   Wellington Half, FNP

## 2024-03-27 ENCOUNTER — Telehealth: Payer: Self-pay | Admitting: Internal Medicine

## 2024-03-27 DIAGNOSIS — E1142 Type 2 diabetes mellitus with diabetic polyneuropathy: Secondary | ICD-10-CM | POA: Diagnosis not present

## 2024-03-27 DIAGNOSIS — T380X5D Adverse effect of glucocorticoids and synthetic analogues, subsequent encounter: Secondary | ICD-10-CM | POA: Diagnosis not present

## 2024-03-27 DIAGNOSIS — L03116 Cellulitis of left lower limb: Secondary | ICD-10-CM | POA: Diagnosis not present

## 2024-03-27 DIAGNOSIS — I480 Paroxysmal atrial fibrillation: Secondary | ICD-10-CM | POA: Diagnosis not present

## 2024-03-27 DIAGNOSIS — I429 Cardiomyopathy, unspecified: Secondary | ICD-10-CM | POA: Diagnosis not present

## 2024-03-27 DIAGNOSIS — I5022 Chronic systolic (congestive) heart failure: Secondary | ICD-10-CM | POA: Diagnosis not present

## 2024-03-27 DIAGNOSIS — E273 Drug-induced adrenocortical insufficiency: Secondary | ICD-10-CM | POA: Diagnosis not present

## 2024-03-27 DIAGNOSIS — S90521D Blister (nonthermal), right ankle, subsequent encounter: Secondary | ICD-10-CM | POA: Diagnosis not present

## 2024-03-27 DIAGNOSIS — I110 Hypertensive heart disease with heart failure: Secondary | ICD-10-CM | POA: Diagnosis not present

## 2024-03-27 NOTE — Telephone Encounter (Unsigned)
 Copied from CRM [id number]. Topic: Clinical - Home Health Verbal Orders >> Mar 27, 2024 12:29 PM Deaijah H wrote: Caller/Agency: Fate Honor w/ [facility] Callback Number: [phone] Service Requested: Physical Therapy 2 eval and treat Frequency: 1w9 / 2 PRN'S Any new concerns about the patient? No, but will follow for cellulitis and will need a meter

## 2024-03-30 ENCOUNTER — Ambulatory Visit: Attending: Internal Medicine

## 2024-03-30 ENCOUNTER — Telehealth: Payer: Self-pay

## 2024-03-30 DIAGNOSIS — Z95810 Presence of automatic (implantable) cardiac defibrillator: Secondary | ICD-10-CM

## 2024-03-30 DIAGNOSIS — I5022 Chronic systolic (congestive) heart failure: Secondary | ICD-10-CM

## 2024-03-30 NOTE — Telephone Encounter (Signed)
 Ok for verbal

## 2024-03-30 NOTE — Telephone Encounter (Signed)
 Relayed message to [facility]

## 2024-03-30 NOTE — Telephone Encounter (Signed)
 Lvm for pamela to call back for Verbal orders.

## 2024-03-30 NOTE — Telephone Encounter (Signed)
 Remote ICM transmission received.  Attempted call to patient regarding ICM remote transmission and no answer.

## 2024-03-30 NOTE — Progress Notes (Signed)
 EPIC Encounter for ICM Monitoring  Patient Name: [name] is a 68 y.o. male Date: 03/30/2024 Primary Care Physican: Roslyn Coombe, MD Primary Cardiologist: Mitzie Anda Electrophysiologist: Victorino Grates Pacing: 98.3%    11/04/2023 Office Weight: 285 lbs 03/24/2024 Weight: Unable to weigh due to balance issues (was 267 lbs while at rehab)    Battery Longevity: 7 months     Attempted call to patient and unable to reach.   Transmission results reviewed.   Hospitalization 4/24 followed by a 30 day rehab stay.   Optivol thoracic impedance suggesting fluid levels improving since starting Torsemide  60 mg twice a day on 6/10 but not returned to baseline yet.   Prescribed:  Torsemide  20 mg 3 tablet(s) (60 mg total) by mouth twice a day (increased 03/24/24).     Labs: 04/01/2024 BMET Scheduled following change in Torsemide  dosage on 6/10 02/13/2024 Creatinine 0.97, BUN 23, Potassium 5.0, Sodium 139, GFR >60  02/12/2024 Creatinine 1.0,   BUN 31, Potassium 4.9, Sodium 137, GFR >60  02/11/2024 Creatinine 1.04, BUN 39, Potassium 4.4, Sodium 137, GFR >60  02/10/2024 Creatinine 1.60, BUN 56, Potassium 4.7, Sodium 135, GFR 46 02/09/2024 Creatinine 2.27, BUN 64, Potassium 4.1, Sodium 131, GFR 30  02/08/2024 Creatinine 2.93, BUN 66, Potassium 4.4, Sodium 129, GFR 22 (5:23 PM) 02/08/2024 Creatinine 3.19, BUN 60, Potassium 4.6, Sodium 129, GFR 20 02/07/2024 Creatinine 2.77, BUN 41, Potassium 4.8, Sodium 135, GFR 24  A complete set of results can be found in Results Review.   Recommendations:  Unable to reach.  Will send to HF clinic if patient is reached and will also advise to make follow up appt with HF clinic post inpatient rehab stay.   Follow-up plan: ICM clinic phone appointment on 04/27/2024.   91 day device clinic remote transmission 04/22/2024.   EP/Cardiology Office Visits:    Recall 12/07/2024 with Dr Rodolfo Clan.   Last HF clinic was 02/03/2024 and no further appts scheduled.   Copy of ICM check sent  to Dr. Rodolfo Clan.   3 month ICM trend: 03/30/2024.    12-14 Month ICM trend:     Almyra Jain, RN 03/30/2024 3:10 PM

## 2024-03-31 ENCOUNTER — Telehealth (HOSPITAL_COMMUNITY): Payer: Self-pay | Admitting: Cardiology

## 2024-03-31 NOTE — Telephone Encounter (Signed)
 Pt aware via detailed message DPR

## 2024-03-31 NOTE — Telephone Encounter (Signed)
 Patient called to report failing kidneys Report decreased UOP for a few days, says he only dribbles.  Reports pain to the touch, unable to describe location  Reports he was seen in the past with similar symptoms and was able to get sone fluid drained.   Reports he has appt 6/18 and would like to know what to do   Advised with concerns above may need evaluation sooner rather than later  (ER/UC) however will route to provider for input

## 2024-04-01 ENCOUNTER — Ambulatory Visit (HOSPITAL_COMMUNITY)
Admission: RE | Admit: 2024-04-01 | Discharge: 2024-04-01 | Disposition: A | Discharge status: Home / Self Care | Source: Ambulatory Visit | Attending: Cardiology | Admitting: Cardiology

## 2024-04-01 DIAGNOSIS — I5022 Chronic systolic (congestive) heart failure: Secondary | ICD-10-CM | POA: Diagnosis not present

## 2024-04-01 DIAGNOSIS — L03116 Cellulitis of left lower limb: Secondary | ICD-10-CM | POA: Diagnosis not present

## 2024-04-01 DIAGNOSIS — Z95810 Presence of automatic (implantable) cardiac defibrillator: Secondary | ICD-10-CM | POA: Insufficient documentation

## 2024-04-01 DIAGNOSIS — S90521D Blister (nonthermal), right ankle, subsequent encounter: Secondary | ICD-10-CM | POA: Diagnosis not present

## 2024-04-01 DIAGNOSIS — T380X5D Adverse effect of glucocorticoids and synthetic analogues, subsequent encounter: Secondary | ICD-10-CM | POA: Diagnosis not present

## 2024-04-01 DIAGNOSIS — I480 Paroxysmal atrial fibrillation: Secondary | ICD-10-CM | POA: Diagnosis not present

## 2024-04-01 DIAGNOSIS — I110 Hypertensive heart disease with heart failure: Secondary | ICD-10-CM | POA: Diagnosis not present

## 2024-04-01 DIAGNOSIS — E273 Drug-induced adrenocortical insufficiency: Secondary | ICD-10-CM | POA: Diagnosis not present

## 2024-04-01 DIAGNOSIS — I429 Cardiomyopathy, unspecified: Secondary | ICD-10-CM | POA: Diagnosis not present

## 2024-04-01 DIAGNOSIS — E1142 Type 2 diabetes mellitus with diabetic polyneuropathy: Secondary | ICD-10-CM | POA: Diagnosis not present

## 2024-04-01 LAB — BASIC METABOLIC PANEL WITH GFR
Anion gap: 14 (ref 5–15)
BUN: 23 mg/dL (ref 8–23)
CO2: 26 mmol/L (ref 22–32)
Calcium: 9.2 mg/dL (ref 8.9–10.3)
Chloride: 94 mmol/L — ABNORMAL LOW (ref 98–111)
Creatinine, Ser: 0.92 mg/dL (ref 0.61–1.24)
GFR, Estimated: >60 mL/min (ref >60)
Glucose, Bld: 90 mg/dL (ref 70–99)
Potassium: 3.2 mmol/L — ABNORMAL LOW (ref 3.5–5.1)
Sodium: 134 mmol/L — ABNORMAL LOW (ref 135–145)

## 2024-04-02 ENCOUNTER — Ambulatory Visit: Admitting: Podiatry

## 2024-04-02 ENCOUNTER — Ambulatory Visit (HOSPITAL_COMMUNITY): Payer: Self-pay | Admitting: Cardiology

## 2024-04-02 DIAGNOSIS — E273 Drug-induced adrenocortical insufficiency: Secondary | ICD-10-CM | POA: Diagnosis not present

## 2024-04-02 DIAGNOSIS — S90521D Blister (nonthermal), right ankle, subsequent encounter: Secondary | ICD-10-CM | POA: Diagnosis not present

## 2024-04-02 DIAGNOSIS — I480 Paroxysmal atrial fibrillation: Secondary | ICD-10-CM | POA: Diagnosis not present

## 2024-04-02 DIAGNOSIS — I429 Cardiomyopathy, unspecified: Secondary | ICD-10-CM | POA: Diagnosis not present

## 2024-04-02 DIAGNOSIS — I110 Hypertensive heart disease with heart failure: Secondary | ICD-10-CM | POA: Diagnosis not present

## 2024-04-02 DIAGNOSIS — I5022 Chronic systolic (congestive) heart failure: Secondary | ICD-10-CM | POA: Diagnosis not present

## 2024-04-02 DIAGNOSIS — E1142 Type 2 diabetes mellitus with diabetic polyneuropathy: Secondary | ICD-10-CM | POA: Diagnosis not present

## 2024-04-02 DIAGNOSIS — L03116 Cellulitis of left lower limb: Secondary | ICD-10-CM | POA: Diagnosis not present

## 2024-04-02 DIAGNOSIS — T380X5D Adverse effect of glucocorticoids and synthetic analogues, subsequent encounter: Secondary | ICD-10-CM | POA: Diagnosis not present

## 2024-04-03 ENCOUNTER — Telehealth: Payer: Self-pay | Admitting: Internal Medicine

## 2024-04-03 NOTE — Telephone Encounter (Signed)
 Ok for [employer]

## 2024-04-03 NOTE — Telephone Encounter (Signed)
 Called and gave verbals.

## 2024-04-03 NOTE — Telephone Encounter (Signed)
 Copied from CRM [id number]. Topic: Clinical - Home Health Verbal Orders >> Apr 03, 2024 10:09 AM Winnifred Havers wrote: Caller/Agency: Gracie from [facility]  Callback Number: [phone] Service Requested: Physical Therapy Frequency:1 week 1 2 week4 1 week 4 Any new concerns about the patient? No

## 2024-04-05 ENCOUNTER — Other Ambulatory Visit: Payer: Self-pay | Admitting: Internal Medicine

## 2024-04-05 DIAGNOSIS — L308 Other specified dermatitis: Secondary | ICD-10-CM

## 2024-04-06 MED ORDER — POTASSIUM CHLORIDE CRYS ER 20 MEQ PO TBCR: 20.0000 meq | EXTENDED_RELEASE_TABLET | Freq: Every day | ORAL | 3 refills | Status: DC

## 2024-04-06 NOTE — Telephone Encounter (Addendum)
 Pt aware, agreeable, and verbalized understanding  Rx sent labs ordered and scheduled   ----- Message from Ezra Shuck sent at 04/02/2024  1:30 PM EDT ----- Increase total daily K by 20 meq and repeat bmet 1 week. ----- Message ----- From: Rebecka, Lab In [place] Sent: 04/01/2024   4:35 PM EDT To: Ezra GORMAN Shuck, MD

## 2024-04-07 ENCOUNTER — Other Ambulatory Visit: Payer: Self-pay

## 2024-04-07 ENCOUNTER — Other Ambulatory Visit: Payer: Self-pay | Admitting: Family Medicine

## 2024-04-07 ENCOUNTER — Telehealth: Payer: Self-pay | Admitting: Family Medicine

## 2024-04-07 NOTE — Telephone Encounter (Signed)
 Patient called and asked if Dr. Claudene could refill his prednisone  10 mg tablets. Please advise.

## 2024-04-08 ENCOUNTER — Telehealth (INDEPENDENT_AMBULATORY_CARE_PROVIDER_SITE_OTHER): Admitting: Internal Medicine

## 2024-04-08 ENCOUNTER — Other Ambulatory Visit (HOSPITAL_COMMUNITY): Payer: Self-pay

## 2024-04-08 ENCOUNTER — Encounter: Payer: Self-pay | Admitting: Podiatry

## 2024-04-08 ENCOUNTER — Other Ambulatory Visit: Payer: Self-pay

## 2024-04-08 ENCOUNTER — Telehealth: Payer: Self-pay

## 2024-04-08 DIAGNOSIS — F32A Depression, unspecified: Secondary | ICD-10-CM | POA: Diagnosis not present

## 2024-04-08 DIAGNOSIS — I429 Cardiomyopathy, unspecified: Secondary | ICD-10-CM | POA: Diagnosis not present

## 2024-04-08 DIAGNOSIS — F419 Anxiety disorder, unspecified: Secondary | ICD-10-CM | POA: Diagnosis not present

## 2024-04-08 DIAGNOSIS — S90521D Blister (nonthermal), right ankle, subsequent encounter: Secondary | ICD-10-CM | POA: Diagnosis not present

## 2024-04-08 DIAGNOSIS — E1142 Type 2 diabetes mellitus with diabetic polyneuropathy: Secondary | ICD-10-CM | POA: Diagnosis not present

## 2024-04-08 DIAGNOSIS — E1165 Type 2 diabetes mellitus with hyperglycemia: Secondary | ICD-10-CM | POA: Diagnosis not present

## 2024-04-08 DIAGNOSIS — G894 Chronic pain syndrome: Secondary | ICD-10-CM

## 2024-04-08 DIAGNOSIS — L03116 Cellulitis of left lower limb: Secondary | ICD-10-CM | POA: Diagnosis not present

## 2024-04-08 DIAGNOSIS — E273 Drug-induced adrenocortical insufficiency: Secondary | ICD-10-CM | POA: Diagnosis not present

## 2024-04-08 DIAGNOSIS — I5022 Chronic systolic (congestive) heart failure: Secondary | ICD-10-CM | POA: Diagnosis not present

## 2024-04-08 DIAGNOSIS — G4700 Insomnia, unspecified: Secondary | ICD-10-CM

## 2024-04-08 DIAGNOSIS — T380X5D Adverse effect of glucocorticoids and synthetic analogues, subsequent encounter: Secondary | ICD-10-CM | POA: Diagnosis not present

## 2024-04-08 DIAGNOSIS — I480 Paroxysmal atrial fibrillation: Secondary | ICD-10-CM | POA: Diagnosis not present

## 2024-04-08 DIAGNOSIS — I110 Hypertensive heart disease with heart failure: Secondary | ICD-10-CM | POA: Diagnosis not present

## 2024-04-08 MED ORDER — PREDNISONE 10 MG PO TABS: 10.0000 mg | ORAL_TABLET | Freq: Every day | ORAL | 0 refills | Status: DC

## 2024-04-08 MED ORDER — DOXEPIN HCL 10 MG PO CAPS: 20.0000 mg | ORAL_CAPSULE | Freq: Every day | ORAL | 1 refills | Status: DC

## 2024-04-08 MED ORDER — LIDOCAINE 5 % EX PTCH: 1.0000 | MEDICATED_PATCH | CUTANEOUS | 2 refills | Status: DC

## 2024-04-08 NOTE — Telephone Encounter (Signed)
 Pharmacy Patient Advocate Encounter   Received notification from Onbase that prior authorization for Lidocaine  5% patches is required/requested.   Insurance verification completed.   The patient is insured through [employer] .   Per test claim: The current 90 day co-pay is, $0.00.  No PA needed at this time. This test claim was processed through [facility]- copay amounts may vary at other pharmacies due to pharmacy/plan contracts, or as the patient moves through the different stages of their insurance plan.    -Patient needs refills.

## 2024-04-08 NOTE — Telephone Encounter (Signed)
 Ok done erx

## 2024-04-08 NOTE — Progress Notes (Signed)
 Patient ID: [name], male   DOB: 01/05/1956, 68 y.o.   MRN: [id number]  Virtual Visit via Video Note  I connected with [name] on 04/08/24 at  8:40 AM EDT by a video enabled telemedicine application and verified that I am speaking with the correct person using two identifiers.  Location of all participants today Patient: at home Provider: at office   I discussed the limitations of evaluation and management by telemedicine and the availability of in person appointments. The patient expressed understanding and agreed to proceed.  History of Present Illness: Pt here overall improved it seems, has been able to wean soma  to bid, stopped the benzo.  Denies worsening depressive symptoms, suicidal ideation, or panic; has ongoing anxiety, and worsening sleep however and asks to restart the doxepin  as before.  Pt denies chest pain, increased sob or doe, wheezing, orthopnea, PND, increased LE swelling, palpitations, dizziness or syncope.   Pt denies polydipsia, polyuria, or new focal neuro s/s.    Past Medical History:  Diagnosis Date   AICD (automatic cardioverter/defibrillator) present    Anemia    supposed to be taking Vit B but doesn't   ANXIETY    takes Xanax  nightly   Arthritis    Asthma    Albuterol  prn and Advair  daily;also takes Prednisone  daily   Cardiomyopathy [facility])    a. EF 25% TEE July 2013; b. EF normalized 2015;  c. 03/2015 Echo: EF 40-45%, difrf HK, PASP 38 mmHg, Mild MR, sev LAE/RAE.   Chronic constipation    takes OTC stool softener   COPD (chronic obstructive pulmonary disease) (HCC)    one dr says COPD; one dr says emphysema (09/18/2017)   COVID-19 12/09/2019   DEPRESSION    takes Zoloft  and Doxepin  daily   Diverticulitis    DYSKINESIA, ESOPHAGUS    Essential hypertension        FIBROMYALGIA    GERD (gastroesophageal reflux disease)        Glaucoma    HYPERLIPIDEMIA    a. Intolerant to statins.   INSOMNIA    takes Ambien  nightly   Myocardial infarction  [facility])    a. 2012 Myoview  notable for prior infarct;  b. 03/2015 Lexiscan  CL: EF 37%, diff HK, small area of inferior infarct from apex to base-->Med Rx.   O2 dependent    2.5L q hs & prn (09/18/2017)   Paroxysmal atrial fibrillation (HCC)    a. CHA2DS2VASc = 3--> takes Coumadin ;  b. 03/15/2015 Successful TEE/DCCV;  c. 03/2015 recurrent afib, Amio d/c'd in setting of hyperthyroidism.   Peripheral neuropathy    Pneumonia 12/2016   Rash and other nonspecific skin eruption 04/12/2009   no cause found saw dermatologists x 2 and allergist   SLEEP APNEA, OBSTRUCTIVE    a. doesn't use CPAP   Syncope    a. 03/2015 s/p MDT LINQ.   Type II diabetes mellitus (HCC)        Past Surgical History:  Procedure Laterality Date   ACNE CYST REMOVAL     2 on back    AV NODE ABLATION N/A 10/25/2017   Procedure: AV NODE ABLATION;  Surgeon: Fernande Elspeth BROCKS, MD;  Location: [facility] INVASIVE CV LAB;  Service: Cardiovascular;  Laterality: N/A;   BIV ICD INSERTION CRT-D N/A 09/18/2017   Procedure: BIV ICD INSERTION CRT-D;  Surgeon: Fernande Elspeth BROCKS, MD;  Location: [facility] INVASIVE CV LAB;  Service: Cardiovascular;  Laterality: N/A;   CARDIAC CATHETERIZATION N/A 03/21/2016   Procedure: Right/Left  Heart Cath and Coronary Angiography;  Surgeon: Ezra GORMAN Shuck, MD;  Location: [facility] INVASIVE CV LAB;  Service: Cardiovascular;  Laterality: N/A;   CARDIOVERSION  04/18/2012   Procedure: CARDIOVERSION;  Surgeon: Vina LULLA Gull, MD;  Location: [facility] OR;  Service: Cardiovascular;  Laterality: N/A;   CARDIOVERSION  04/25/2012   Procedure: CARDIOVERSION;  Surgeon: Aleene JINNY Passe, MD;  Location: [facility] ENDOSCOPY;  Service: Cardiovascular;  Laterality: N/A;   CARDIOVERSION  04/25/2012   Procedure: CARDIOVERSION;  Surgeon: Vina LULLA Gull, MD;  Location: [facility] OR;  Service: Cardiovascular;  Laterality: N/A;   CARDIOVERSION  05/09/2012   Procedure: CARDIOVERSION;  Surgeon: Ozell Fell, MD;  Location: [facility] OR;  Service: Cardiovascular;  Laterality: N/A;  changed from  crenshaw to cooper by trish/leone-endo   CARDIOVERSION N/A 03/15/2015   Procedure: CARDIOVERSION;  Surgeon: Aleene JINNY Passe, MD;  Location: [facility] ENDOSCOPY;  Service: Cardiovascular;  Laterality: N/A;   COLONOSCOPY     COLONOSCOPY WITH PROPOFOL  N/A 10/21/2014   Procedure: COLONOSCOPY WITH PROPOFOL ;  Surgeon: Gwendlyn ONEIDA Buddy, MD;  Location: [facility] ENDOSCOPY;  Service: Endoscopy;  Laterality: N/A;   EP IMPLANTABLE DEVICE N/A 04/06/2015   Procedure: Loop Recorder Insertion;  Surgeon: Danelle LELON Birmingham, MD;  Location: [facility] INVASIVE CV LAB;  Service: Cardiovascular;  Laterality: N/A;   ESOPHAGOGASTRODUODENOSCOPY     JOINT REPLACEMENT     LOOP RECORDER REMOVAL N/A 09/18/2017   Procedure: LOOP RECORDER REMOVAL;  Surgeon: Fernande Elspeth BROCKS, MD;  Location: [facility] INVASIVE CV LAB;  Service: Cardiovascular;  Laterality: N/A;   RIGHT/LEFT HEART CATH AND CORONARY ANGIOGRAPHY N/A 01/28/2017   Procedure: Right/Left Heart Cath and Coronary Angiography;  Surgeon: Ezra GORMAN Shuck, MD;  Location: [facility] INVASIVE CV LAB;  Service: Cardiovascular;  Laterality: N/A;   TEE WITHOUT CARDIOVERSION  04/25/2012   Procedure: TRANSESOPHAGEAL ECHOCARDIOGRAM (TEE);  Surgeon: Aleene JINNY Passe, MD;  Location: [facility] ENDOSCOPY;  Service: Cardiovascular;  Laterality: N/A;   TEE WITHOUT CARDIOVERSION N/A 03/15/2015   Procedure: TRANSESOPHAGEAL ECHOCARDIOGRAM (TEE);  Surgeon: Aleene JINNY Passe, MD;  Location: [facility] ENDOSCOPY;  Service: Cardiovascular;  Laterality: N/A;   TONSILLECTOMY AND ADENOIDECTOMY     TOTAL KNEE ARTHROPLASTY Right 06/15/2014   Procedure: TOTAL KNEE ARTHROPLASTY;  Surgeon: Evalene JONETTA Chancy, MD;  Location: [facility] OR;  Service: Orthopedics;  Laterality: Right;   TOTAL KNEE ARTHROPLASTY Left 10/17/2021   Procedure: Left TOTAL KNEE ARTHROPLASTY;  Surgeon: Vernetta Lonni GRADE, MD;  Location: [facility] OR;  Service: Orthopedics;  Laterality: Left;    reports that he quit smoking about 16 years ago. His smoking use included cigarettes. He started smoking about 46  years ago. He has a 60 pack-year smoking history. He has never used smokeless tobacco. He reports that he does not drink alcohol and does not use drugs. family history includes Allergies in his mother; Asthma in his maternal grandmother and mother; COPD in his mother; Colon polyps in his mother; Hypothyroidism in his mother. Allergies  Allergen Reactions   Tape Other (See Comments)    SKIN TEARS VERY, VERY EASILY!!!! Use Paper Tape Only, PLEASE!!!   Amiodarone  Other (See Comments)    Caused hyperthyroidism    Statins Other (See Comments)    Myalgias    Sulfa  Antibiotics Other (See Comments)    Mouth ulcers   Wound Dressing Adhesive Other (See Comments)    Skin Tears Use Paper Tape Only   Current Outpatient Medications on File Prior to Visit  Medication Sig Dispense Refill   potassium chloride  SA (KLOR-CON  M) 20  MEQ tablet Take 1 tablet (20 mEq total) by mouth daily. 90 tablet 3   albuterol  (VENTOLIN  HFA) 108 (90 Base) MCG/ACT inhaler INHALE 1-2 PUFFS BY MOUTH EVERY 6 HOURS AS NEEDED FOR WHEEZE OR SHORTNESS OF BREATH 8.5 each 5   ALPRAZolam  (XANAX ) 0.5 MG tablet Take 1 tablet (0.5 mg total) by mouth at bedtime. 30 tablet 0   ammonium lactate  (AMLACTIN) 12 % cream Apply 1 Application topically as needed for dry skin. 385 g 3   amoxicillin -clavulanate (AUGMENTIN ) 875-125 MG tablet Take 1 tablet by mouth 2 (two) times daily. 20 tablet 0   apixaban  (ELIQUIS ) 5 MG TABS tablet Take 1 tablet (5 mg total) by mouth 2 (two) times daily. 60 tablet 11   ascorbic acid (VITAMIN C) 500 MG tablet Take 500 mg by mouth daily.     augmented betamethasone  dipropionate (DIPROLENE -AF) 0.05 % cream Apply 1 application  topically daily as needed (to affected areas- for itching).     carisoprodol  (SOMA ) 350 MG tablet Take 1 tablet (350 mg total) by mouth 4 (four) times daily as needed for muscle spasms. 120 tablet 0   Cholecalciferol  (VITAMIN D3) 50 MCG (2000 UT) TABS Take 2,000 Units by mouth daily.      Cyanocobalamin  (VITAMIN B-12) 2500 MCG SUBL Place 2,500 mcg under the tongue daily.     DULoxetine  (CYMBALTA ) 60 MG capsule TAKE 1 CAPSULE BY MOUTH EVERY DAY 90 capsule 2   Dupilumab (DUPIXENT) 300 MG/2ML SOPN Inject 300 mg into the skin every 14 (fourteen) days.     Evolocumab  (REPATHA  SURECLICK) 140 MG/ML SOAJ INJECT 140 MG INTO THE SKIN EVERY 14 DAYS 6 mL 0   fluticasone  (FLONASE ) 50 MCG/ACT nasal spray Place 2 sprays into both nostrils daily as needed for allergies or rhinitis. 48 mL 1   gabapentin  (NEURONTIN ) 100 MG capsule Take 1 capsule (100 mg total) by mouth 3 (three) times daily. 90 capsule 3   ipratropium-albuterol  (DUONEB) 0.5-2.5 (3) MG/3ML SOLN TAKE 3 ML BY NEBULIZATION EVERY 4 HOURS AS NEEDED (WHEEZING). 360 mL 0   leptospermum manuka honey (MEDIHONEY) PSTE paste Apply 1 Application topically daily. 44 mL 2   linaclotide  (LINZESS ) 145 MCG CAPS capsule Take 1 capsule (145 mcg total) by mouth daily before breakfast. 30 capsule 2   losartan  (COZAAR ) 25 MG tablet Take 1 tablet (25 mg total) by mouth daily. 90 tablet 2   metoprolol  tartrate (LOPRESSOR ) 25 MG tablet Take 0.5 tablets (12.5 mg total) by mouth 2 (two) times daily.     Multiple Vitamins-Minerals (SENIOR VITES PO) Take 1 tablet by mouth daily with breakfast.     omeprazole  (PRILOSEC) 20 MG capsule TAKE 1 CAPSULE BY MOUTH TWICE A DAY 180 capsule 1   tamsulosin  (FLOMAX ) 0.4 MG CAPS capsule TAKE 1 CAPSULE BY MOUTH EVERY DAY 90 capsule 3   Tiotropium Bromide  Monohydrate (SPIRIVA  RESPIMAT) 2.5 MCG/ACT AERS Take 2 puffs by nebulization daily. (Patient taking differently: Inhale 2 puffs into the lungs daily.) 4 g 3   tirzepatide  (MOUNJARO ) 7.5 MG/0.5ML Pen Inject 7.5 mg into the skin once a week. 6 mL 3   torsemide  (DEMADEX ) 20 MG tablet Take 3 tablets (60 mg total) by mouth 2 (two) times daily. 540 tablet 2   traMADol  (ULTRAM ) 50 MG tablet Take 1 tablet (50 mg total) by mouth every 8 (eight) hours as needed. 90 tablet 2   TUMS 500 MG  chewable tablet Chew 1 tablet by mouth 2 (two) times daily as needed for indigestion  or heartburn.     TYLENOL  500 MG tablet Take 2,000 mg by mouth in the morning and at bedtime.     venlafaxine  XR (EFFEXOR -XR) 150 MG 24 hr capsule TAKE 1 CAPSULE BY MOUTH DAILY WITH BREAKFAST. 90 capsule 3   WIXELA INHUB 250-50 MCG/ACT AEPB INHALE 1 PUFF INTO THE LUNGS IN THE MORNING AND AT BEDTIME. 60 each 8   zolpidem  (AMBIEN ) 5 MG tablet Take 1 tablet (5 mg total) by mouth at bedtime as needed for sleep. 90 tablet 1   No current facility-administered medications on file prior to visit.    Observations/Objective: Alert, NAD, appropriate mood and affect, resps normal, cn 2-12 intact, moves all 4s, no visible rash or swelling Lab Results  Component Value Date   WBC 11.5 (H) 02/13/2024   HGB 9.9 (L) 02/13/2024   HCT 32.3 (L) 02/13/2024   PLT 298 02/13/2024   GLUCOSE 90 04/01/2024   CHOL 130 01/07/2024   TRIG 139.0 01/07/2024   HDL 52.40 01/07/2024   LDLDIRECT 72.0 02/28/2021   LDLCALC 49 01/07/2024   ALT 28 02/06/2024   AST 25 02/06/2024   NA 134 (L) 04/01/2024   K 3.2 (L) 04/01/2024   CL 94 (L) 04/01/2024   CREATININE 0.92 04/01/2024   BUN 23 04/01/2024   CO2 26 04/01/2024   TSH 2.02 01/07/2024   PSA 3.46 01/07/2024   INR 1.2 01/24/2024   HGBA1C 6.0 01/07/2024   MICROALBUR <0.7 01/07/2024   Assessment and Plan: See notes  Follow Up Instructions: See notes   I discussed the assessment and treatment plan with the patient. The patient was provided an opportunity to ask questions and all were answered. The patient agreed with the plan and demonstrated an understanding of the instructions.   The patient was advised to call back or seek an in-person evaluation if the symptoms worsen or if the condition fails to improve as anticipated.   Lynwood Rush, MD

## 2024-04-10 ENCOUNTER — Telehealth: Payer: Self-pay | Admitting: Internal Medicine

## 2024-04-10 NOTE — Telephone Encounter (Signed)
 Copied from CRM [id number]. Topic: General - Other >> Apr 10, 2024  4:20 PM [name] wrote: Reason for CRM: Patient is calling because [employer] contacted him stating they need information sent over stating the patient has type 2 diabetes and he also sees a cardiologist

## 2024-04-11 ENCOUNTER — Encounter: Payer: Self-pay | Admitting: Internal Medicine

## 2024-04-11 NOTE — Assessment & Plan Note (Signed)
 Overall improved, to continue off benzo for now

## 2024-04-11 NOTE — Assessment & Plan Note (Signed)
 Lab Results  Component Value Date   HGBA1C 6.0 01/07/2024   Stable, pt to continue current medical treatment mounaro 7.5 mg

## 2024-04-11 NOTE — Assessment & Plan Note (Signed)
 Pt able to reduce soma  to bid prn,  to f/u any worsening symptoms or concerns

## 2024-04-11 NOTE — Assessment & Plan Note (Signed)
 With mild worsening, for doxepin  10 mg - 2 at bedtime prn

## 2024-04-11 NOTE — Patient Instructions (Signed)
 Please take all new medication as prescribed

## 2024-04-12 ENCOUNTER — Other Ambulatory Visit: Payer: Self-pay

## 2024-04-12 ENCOUNTER — Inpatient Hospital Stay (HOSPITAL_COMMUNITY)
Admission: EM | Admit: 2024-04-12 | Discharge: 2024-04-17 | DRG: 917 | Disposition: A | Discharge status: Skilled Nursing Facility | Attending: Internal Medicine | Admitting: Internal Medicine

## 2024-04-12 ENCOUNTER — Encounter (HOSPITAL_COMMUNITY): Payer: Self-pay | Admitting: *Deleted

## 2024-04-12 ENCOUNTER — Emergency Department (HOSPITAL_COMMUNITY)

## 2024-04-12 DIAGNOSIS — R1032 Left lower quadrant pain: Secondary | ICD-10-CM | POA: Diagnosis present

## 2024-04-12 DIAGNOSIS — I5020 Unspecified systolic (congestive) heart failure: Secondary | ICD-10-CM

## 2024-04-12 DIAGNOSIS — N202 Calculus of kidney with calculus of ureter: Secondary | ICD-10-CM | POA: Diagnosis not present

## 2024-04-12 DIAGNOSIS — I952 Hypotension due to drugs: Principal | ICD-10-CM | POA: Diagnosis present

## 2024-04-12 DIAGNOSIS — I110 Hypertensive heart disease with heart failure: Secondary | ICD-10-CM | POA: Diagnosis present

## 2024-04-12 DIAGNOSIS — Z882 Allergy status to sulfonamides status: Secondary | ICD-10-CM

## 2024-04-12 DIAGNOSIS — R6521 Severe sepsis with septic shock: Secondary | ICD-10-CM | POA: Diagnosis present

## 2024-04-12 DIAGNOSIS — R531 Weakness: Secondary | ICD-10-CM | POA: Diagnosis not present

## 2024-04-12 DIAGNOSIS — A419 Sepsis, unspecified organism: Secondary | ICD-10-CM | POA: Diagnosis present

## 2024-04-12 DIAGNOSIS — T50901A Poisoning by unspecified drugs, medicaments and biological substances, accidental (unintentional), initial encounter: Secondary | ICD-10-CM

## 2024-04-12 DIAGNOSIS — E785 Hyperlipidemia, unspecified: Secondary | ICD-10-CM | POA: Diagnosis present

## 2024-04-12 DIAGNOSIS — T481X1D Poisoning by skeletal muscle relaxants [neuromuscular blocking agents], accidental (unintentional), subsequent encounter: Secondary | ICD-10-CM | POA: Diagnosis not present

## 2024-04-12 DIAGNOSIS — Z9981 Dependence on supplemental oxygen: Secondary | ICD-10-CM

## 2024-04-12 DIAGNOSIS — K219 Gastro-esophageal reflux disease without esophagitis: Secondary | ICD-10-CM | POA: Diagnosis not present

## 2024-04-12 DIAGNOSIS — E876 Hypokalemia: Secondary | ICD-10-CM | POA: Diagnosis present

## 2024-04-12 DIAGNOSIS — N281 Cyst of kidney, acquired: Secondary | ICD-10-CM | POA: Diagnosis not present

## 2024-04-12 DIAGNOSIS — Z79899 Other long term (current) drug therapy: Secondary | ICD-10-CM

## 2024-04-12 DIAGNOSIS — Z7951 Long term (current) use of inhaled steroids: Secondary | ICD-10-CM

## 2024-04-12 DIAGNOSIS — R7303 Prediabetes: Secondary | ICD-10-CM

## 2024-04-12 DIAGNOSIS — Z1612 Extended spectrum beta lactamase (ESBL) resistance: Secondary | ICD-10-CM | POA: Diagnosis present

## 2024-04-12 DIAGNOSIS — E861 Hypovolemia: Secondary | ICD-10-CM | POA: Diagnosis present

## 2024-04-12 DIAGNOSIS — I5022 Chronic systolic (congestive) heart failure: Secondary | ICD-10-CM | POA: Diagnosis not present

## 2024-04-12 DIAGNOSIS — G4733 Obstructive sleep apnea (adult) (pediatric): Secondary | ICD-10-CM | POA: Diagnosis present

## 2024-04-12 DIAGNOSIS — Z7985 Long-term (current) use of injectable non-insulin antidiabetic drugs: Secondary | ICD-10-CM | POA: Diagnosis not present

## 2024-04-12 DIAGNOSIS — Z87891 Personal history of nicotine dependence: Secondary | ICD-10-CM | POA: Diagnosis not present

## 2024-04-12 DIAGNOSIS — E1140 Type 2 diabetes mellitus with diabetic neuropathy, unspecified: Secondary | ICD-10-CM | POA: Diagnosis not present

## 2024-04-12 DIAGNOSIS — L03116 Cellulitis of left lower limb: Secondary | ICD-10-CM

## 2024-04-12 DIAGNOSIS — R262 Difficulty in walking, not elsewhere classified: Secondary | ICD-10-CM | POA: Diagnosis not present

## 2024-04-12 DIAGNOSIS — N200 Calculus of kidney: Secondary | ICD-10-CM | POA: Diagnosis not present

## 2024-04-12 DIAGNOSIS — N390 Urinary tract infection, site not specified: Secondary | ICD-10-CM | POA: Diagnosis not present

## 2024-04-12 DIAGNOSIS — M797 Fibromyalgia: Secondary | ICD-10-CM | POA: Diagnosis not present

## 2024-04-12 DIAGNOSIS — R571 Hypovolemic shock: Secondary | ICD-10-CM | POA: Diagnosis not present

## 2024-04-12 DIAGNOSIS — K5792 Diverticulitis of intestine, part unspecified, without perforation or abscess without bleeding: Secondary | ICD-10-CM | POA: Diagnosis not present

## 2024-04-12 DIAGNOSIS — E119 Type 2 diabetes mellitus without complications: Secondary | ICD-10-CM | POA: Diagnosis not present

## 2024-04-12 DIAGNOSIS — T428X1A Poisoning by antiparkinsonism drugs and other central muscle-tone depressants, accidental (unintentional), initial encounter: Secondary | ICD-10-CM | POA: Diagnosis not present

## 2024-04-12 DIAGNOSIS — Z888 Allergy status to other drugs, medicaments and biological substances status: Secondary | ICD-10-CM

## 2024-04-12 DIAGNOSIS — Z95810 Presence of automatic (implantable) cardiac defibrillator: Secondary | ICD-10-CM

## 2024-04-12 DIAGNOSIS — Z7901 Long term (current) use of anticoagulants: Secondary | ICD-10-CM

## 2024-04-12 DIAGNOSIS — J449 Chronic obstructive pulmonary disease, unspecified: Secondary | ICD-10-CM | POA: Diagnosis not present

## 2024-04-12 DIAGNOSIS — Z7401 Bed confinement status: Secondary | ICD-10-CM | POA: Diagnosis not present

## 2024-04-12 DIAGNOSIS — R1312 Dysphagia, oropharyngeal phase: Secondary | ICD-10-CM | POA: Diagnosis not present

## 2024-04-12 DIAGNOSIS — N201 Calculus of ureter: Secondary | ICD-10-CM

## 2024-04-12 DIAGNOSIS — K5904 Chronic idiopathic constipation: Secondary | ICD-10-CM | POA: Diagnosis not present

## 2024-04-12 DIAGNOSIS — K5732 Diverticulitis of large intestine without perforation or abscess without bleeding: Secondary | ICD-10-CM | POA: Diagnosis present

## 2024-04-12 DIAGNOSIS — Z96653 Presence of artificial knee joint, bilateral: Secondary | ICD-10-CM | POA: Diagnosis present

## 2024-04-12 DIAGNOSIS — G894 Chronic pain syndrome: Secondary | ICD-10-CM | POA: Diagnosis present

## 2024-04-12 DIAGNOSIS — F32A Depression, unspecified: Secondary | ICD-10-CM | POA: Diagnosis not present

## 2024-04-12 DIAGNOSIS — R079 Chest pain, unspecified: Secondary | ICD-10-CM | POA: Diagnosis not present

## 2024-04-12 DIAGNOSIS — R140 Abdominal distension (gaseous): Secondary | ICD-10-CM | POA: Diagnosis not present

## 2024-04-12 DIAGNOSIS — R579 Shock, unspecified: Secondary | ICD-10-CM | POA: Diagnosis present

## 2024-04-12 DIAGNOSIS — R001 Bradycardia, unspecified: Secondary | ICD-10-CM | POA: Diagnosis not present

## 2024-04-12 DIAGNOSIS — M6281 Muscle weakness (generalized): Secondary | ICD-10-CM | POA: Diagnosis not present

## 2024-04-12 DIAGNOSIS — I959 Hypotension, unspecified: Secondary | ICD-10-CM | POA: Diagnosis not present

## 2024-04-12 DIAGNOSIS — I480 Paroxysmal atrial fibrillation: Secondary | ICD-10-CM | POA: Diagnosis not present

## 2024-04-12 DIAGNOSIS — I10 Essential (primary) hypertension: Secondary | ICD-10-CM | POA: Diagnosis not present

## 2024-04-12 DIAGNOSIS — F918 Other conduct disorders: Secondary | ICD-10-CM | POA: Diagnosis not present

## 2024-04-12 DIAGNOSIS — R918 Other nonspecific abnormal finding of lung field: Secondary | ICD-10-CM | POA: Diagnosis not present

## 2024-04-12 LAB — COMPREHENSIVE METABOLIC PANEL WITH GFR
ALT: 74 U/L — ABNORMAL HIGH (ref 0–44)
AST: 67 U/L — ABNORMAL HIGH (ref 15–41)
Albumin: 2.8 g/dL — ABNORMAL LOW (ref 3.5–5.0)
Alkaline Phosphatase: 78 U/L (ref 38–126)
Anion gap: 9 (ref 5–15)
BUN: 23 mg/dL (ref 8–23)
CO2: 29 mmol/L (ref 22–32)
Calcium: 9.4 mg/dL (ref 8.9–10.3)
Chloride: 96 mmol/L — ABNORMAL LOW (ref 98–111)
Creatinine, Ser: 1.27 mg/dL — ABNORMAL HIGH (ref 0.61–1.24)
GFR, Estimated: >60 mL/min (ref >60)
Glucose, Bld: 109 mg/dL — ABNORMAL HIGH (ref 70–99)
Potassium: 4.1 mmol/L (ref 3.5–5.1)
Sodium: 134 mmol/L — ABNORMAL LOW (ref 135–145)
Total Bilirubin: 0.9 mg/dL (ref 0.0–1.2)
Total Protein: 5.7 g/dL — ABNORMAL LOW (ref 6.5–8.1)

## 2024-04-12 LAB — CBC WITH DIFFERENTIAL/PLATELET
Abs Immature Granulocytes: 0.08 10*3/uL — ABNORMAL HIGH (ref 0.00–0.07)
Basophils Absolute: 0.1 10*3/uL (ref 0.0–0.1)
Basophils Relative: 1 %
Eosinophils Absolute: 0.2 10*3/uL (ref 0.0–0.5)
Eosinophils Relative: 2 %
HCT: 34.2 % — ABNORMAL LOW (ref 39.0–52.0)
Hemoglobin: 10.3 g/dL — ABNORMAL LOW (ref 13.0–17.0)
Immature Granulocytes: 1 %
Lymphocytes Relative: 15 %
Lymphs Abs: 1.3 10*3/uL (ref 0.7–4.0)
MCH: 28.5 pg (ref 26.0–34.0)
MCHC: 30.1 g/dL (ref 30.0–36.0)
MCV: 94.7 fL (ref 80.0–100.0)
Monocytes Absolute: 1 10*3/uL (ref 0.1–1.0)
Monocytes Relative: 11 %
Neutro Abs: 6 10*3/uL (ref 1.7–7.7)
Neutrophils Relative %: 70 %
Platelets: 296 10*3/uL (ref 150–400)
RBC: 3.61 MIL/uL — ABNORMAL LOW (ref 4.22–5.81)
RDW: 15.7 % — ABNORMAL HIGH (ref 11.5–15.5)
WBC: 8.6 10*3/uL (ref 4.0–10.5)
nRBC: 0 % (ref 0.0–0.2)

## 2024-04-12 LAB — URINALYSIS, ROUTINE W REFLEX MICROSCOPIC
Bilirubin Urine: NEGATIVE
Glucose, UA: NEGATIVE mg/dL
Hgb urine dipstick: NEGATIVE
Ketones, ur: NEGATIVE mg/dL
Leukocytes,Ua: NEGATIVE
Nitrite: NEGATIVE
Protein, ur: 30 mg/dL — AB
Specific Gravity, Urine: >1.046 — ABNORMAL HIGH (ref 1.005–1.030)
pH: 5 (ref 5.0–8.0)

## 2024-04-12 LAB — GLUCOSE, CAPILLARY
Glucose-Capillary: 95 mg/dL (ref 70–99)
Glucose-Capillary: 80 mg/dL (ref 70–99)
Glucose-Capillary: 95 mg/dL (ref 70–99)

## 2024-04-12 LAB — MRSA NEXT GEN BY PCR, NASAL: MRSA by PCR Next Gen: DETECTED — AB

## 2024-04-12 LAB — LIPASE, BLOOD: Lipase: 40 U/L (ref 11–51)

## 2024-04-12 LAB — I-STAT CG4 LACTIC ACID, ED: Lactic Acid, Venous: 1.5 mmol/L (ref 0.5–1.9)

## 2024-04-12 MED ORDER — OXYCODONE HCL 5 MG PO TABS: 5.0000 mg | ORAL_TABLET | ORAL | 0 refills | Status: DC | PRN

## 2024-04-12 MED ORDER — ONDANSETRON HCL 4 MG/2ML IJ SOLN
4.0000 mg | Freq: Once | INTRAMUSCULAR | Status: AC
  Administered 2024-04-12: 4 mg via INTRAVENOUS
  Filled 2024-04-12: qty 2

## 2024-04-12 MED ORDER — METRONIDAZOLE 500 MG/100ML IV SOLN: 500.0000 mg | Freq: Once | INTRAVENOUS | Status: DC

## 2024-04-12 MED ORDER — NOREPINEPHRINE 4 MG/250ML-% IV SOLN
INTRAVENOUS | Status: AC
  Filled 2024-04-12: qty 250

## 2024-04-12 MED ORDER — MORPHINE SULFATE (PF) 2 MG/ML IV SOLN
2.0000 mg | INTRAVENOUS | Status: DC | PRN
  Administered 2024-04-12 – 2024-04-15 (×6): 2 mg via INTRAVENOUS
  Filled 2024-04-12 (×6): qty 1

## 2024-04-12 MED ORDER — ARFORMOTEROL TARTRATE 15 MCG/2ML IN NEBU
15.0000 ug | INHALATION_SOLUTION | Freq: Two times a day (BID) | RESPIRATORY_TRACT | Status: DC
  Administered 2024-04-12 – 2024-04-17 (×10): 15 ug via RESPIRATORY_TRACT
  Filled 2024-04-12 (×10): qty 2

## 2024-04-12 MED ORDER — TAMSULOSIN HCL 0.4 MG PO CAPS
0.4000 mg | ORAL_CAPSULE | Freq: Every day | ORAL | 0 refills | Status: DC
Start: 2024-04-12 — End: 2024-07-30

## 2024-04-12 MED ORDER — CALCIUM CARBONATE ANTACID 500 MG PO CHEW: 1.0000 | CHEWABLE_TABLET | Freq: Two times a day (BID) | ORAL | Status: DC | PRN

## 2024-04-12 MED ORDER — APIXABAN 5 MG PO TABS
5.0000 mg | ORAL_TABLET | Freq: Two times a day (BID) | ORAL | Status: DC
  Administered 2024-04-12 – 2024-04-17 (×11): 5 mg via ORAL
  Filled 2024-04-12 (×11): qty 1

## 2024-04-12 MED ORDER — PIPERACILLIN-TAZOBACTAM 3.375 G IVPB
3.3750 g | Freq: Three times a day (TID) | INTRAVENOUS | Status: DC
  Administered 2024-04-12 – 2024-04-17 (×15): 3.375 g via INTRAVENOUS
  Filled 2024-04-12 (×14): qty 50

## 2024-04-12 MED ORDER — SODIUM CHLORIDE 0.9 % IV SOLN
1.0000 g | Freq: Once | INTRAVENOUS | Status: AC
  Administered 2024-04-12: 1 g via INTRAVENOUS
  Filled 2024-04-12: qty 10

## 2024-04-12 MED ORDER — IOHEXOL 350 MG/ML SOLN
75.0000 mL | Freq: Once | INTRAVENOUS | Status: AC | PRN
  Administered 2024-04-12: 75 mL via INTRAVENOUS

## 2024-04-12 MED ORDER — ENOXAPARIN SODIUM 40 MG/0.4ML IJ SOSY
40.0000 mg | PREFILLED_SYRINGE | INTRAMUSCULAR | Status: DC
  Administered 2024-04-12: 40 mg via SUBCUTANEOUS
  Filled 2024-04-12: qty 0.4

## 2024-04-12 MED ORDER — GABAPENTIN 100 MG PO CAPS
100.0000 mg | ORAL_CAPSULE | Freq: Every day | ORAL | Status: DC
  Administered 2024-04-12 – 2024-04-16 (×5): 100 mg via ORAL
  Filled 2024-04-12 (×5): qty 1

## 2024-04-12 MED ORDER — REVEFENACIN 175 MCG/3ML IN SOLN
175.0000 ug | Freq: Every day | RESPIRATORY_TRACT | Status: DC
  Administered 2024-04-12 – 2024-04-17 (×6): 175 ug via RESPIRATORY_TRACT
  Filled 2024-04-12 (×6): qty 3

## 2024-04-12 MED ORDER — NOREPINEPHRINE 4 MG/250ML-% IV SOLN
0.0000 ug/min | INTRAVENOUS | Status: DC
  Administered 2024-04-12: 5 ug/min via INTRAVENOUS

## 2024-04-12 MED ORDER — IPRATROPIUM-ALBUTEROL 0.5-2.5 (3) MG/3ML IN SOLN
3.0000 mL | Freq: Four times a day (QID) | RESPIRATORY_TRACT | Status: DC | PRN
Start: 2024-04-12 — End: 2024-04-17
  Administered 2024-04-12: 3 mL via RESPIRATORY_TRACT
  Filled 2024-04-12: qty 3

## 2024-04-12 MED ORDER — LINACLOTIDE 145 MCG PO CAPS: 145.0000 ug | ORAL_CAPSULE | Freq: Every day | ORAL | Status: DC

## 2024-04-12 MED ORDER — MUPIROCIN 2 % EX OINT
TOPICAL_OINTMENT | Freq: Two times a day (BID) | CUTANEOUS | Status: AC
  Filled 2024-04-12 (×3): qty 22

## 2024-04-12 MED ORDER — POLYETHYLENE GLYCOL 3350 17 G PO PACK
17.0000 g | PACK | Freq: Every day | ORAL | Status: DC | PRN
  Administered 2024-04-16: 17 g via ORAL
  Filled 2024-04-12: qty 1

## 2024-04-12 MED ORDER — DOCUSATE SODIUM 100 MG PO CAPS: 100.0000 mg | ORAL_CAPSULE | Freq: Two times a day (BID) | ORAL | Status: DC | PRN

## 2024-04-12 MED ORDER — LIDOCAINE 5 % EX PTCH
1.0000 | MEDICATED_PATCH | CUTANEOUS | Status: DC
  Administered 2024-04-12 – 2024-04-16 (×5): 1 via TRANSDERMAL
  Filled 2024-04-12 (×5): qty 1

## 2024-04-12 MED ORDER — NOREPINEPHRINE 4 MG/250ML-% IV SOLN
0.0000 ug/min | INTRAVENOUS | Status: DC
  Administered 2024-04-12: 2 ug/min via INTRAVENOUS

## 2024-04-12 MED ORDER — PANTOPRAZOLE SODIUM 40 MG PO TBEC
40.0000 mg | DELAYED_RELEASE_TABLET | Freq: Two times a day (BID) | ORAL | Status: DC
  Administered 2024-04-12 – 2024-04-17 (×11): 40 mg via ORAL
  Filled 2024-04-12 (×11): qty 1

## 2024-04-12 MED ORDER — CHLORHEXIDINE GLUCONATE CLOTH 2 % EX PADS
6.0000 | MEDICATED_PAD | Freq: Every day | CUTANEOUS | Status: DC
  Administered 2024-04-12 – 2024-04-13 (×2): 6 via TOPICAL

## 2024-04-12 MED ORDER — INSULIN ASPART 100 UNIT/ML IJ SOLN: 0.0000 [IU] | INTRAMUSCULAR | Status: DC

## 2024-04-12 MED ORDER — INSULIN ASPART 100 UNIT/ML IJ SOLN: 0.0000 [IU] | Freq: Three times a day (TID) | INTRAMUSCULAR | Status: DC

## 2024-04-12 MED ORDER — DOXEPIN HCL 10 MG PO CAPS
20.0000 mg | ORAL_CAPSULE | Freq: Every day | ORAL | Status: DC
  Administered 2024-04-12 – 2024-04-16 (×5): 20 mg via ORAL
  Filled 2024-04-12 (×6): qty 2

## 2024-04-12 MED ORDER — SODIUM CHLORIDE 0.9 % IV SOLN
250.0000 mL | INTRAVENOUS | Status: AC
  Administered 2024-04-12: 250 mL via INTRAVENOUS

## 2024-04-12 MED ORDER — ONDANSETRON 4 MG PO TBDP: 4.0000 mg | ORAL_TABLET | Freq: Three times a day (TID) | ORAL | 0 refills | Status: DC | PRN

## 2024-04-12 MED ORDER — TRAMADOL HCL 50 MG PO TABS
50.0000 mg | ORAL_TABLET | Freq: Three times a day (TID) | ORAL | Status: DC | PRN
  Administered 2024-04-12 – 2024-04-17 (×11): 50 mg via ORAL
  Filled 2024-04-12 (×11): qty 1

## 2024-04-12 MED ORDER — ACETAMINOPHEN 325 MG PO TABS
650.0000 mg | ORAL_TABLET | Freq: Four times a day (QID) | ORAL | Status: DC | PRN
  Administered 2024-04-12 – 2024-04-17 (×10): 650 mg via ORAL
  Filled 2024-04-12 (×10): qty 2

## 2024-04-12 MED ORDER — SODIUM CHLORIDE 0.9 % IV BOLUS
1000.0000 mL | Freq: Once | INTRAVENOUS | Status: AC
  Administered 2024-04-12: 1000 mL via INTRAVENOUS

## 2024-04-12 MED ORDER — ONDANSETRON HCL 4 MG/2ML IJ SOLN
4.0000 mg | Freq: Four times a day (QID) | INTRAMUSCULAR | Status: DC | PRN
  Administered 2024-04-14 – 2024-04-15 (×2): 4 mg via INTRAVENOUS
  Filled 2024-04-12 (×2): qty 2

## 2024-04-12 MED ORDER — BUDESONIDE 0.25 MG/2ML IN SUSP
0.2500 mg | Freq: Two times a day (BID) | RESPIRATORY_TRACT | Status: DC
  Administered 2024-04-12 – 2024-04-17 (×10): 0.25 mg via RESPIRATORY_TRACT
  Filled 2024-04-12 (×9): qty 2

## 2024-04-12 NOTE — ED Provider Notes (Signed)
 [place] EMERGENCY DEPARTMENT AT [facility] Provider Note   CSN: [id number] Arrival date & time: 04/12/24  9657     Patient presents with: Abdominal Pain   [name] is a 68 y.o. male.  Patient with past medical history significant for morbid obesity, OSA, chronic pain syndrome, GERD, fibromyalgia, COPD presents to the emergency department via EMS.  EMS was called by the patient's wife.  The patient reportedly took 6 tizanidine  tablets before bed.  Based on chart review plans were to discontinue tizanidine  due to concerns about polypharmacy.  EMS reports patient was lethargic but responsive to verbal stimuli.  At the time of my assessment patient is alert to verbal stimuli and is oriented.  Patient states that his doctor has told him he can take up to 6 tizanidine  at a time.  The patient's chief complaint is left lower quadrant abdominal pain.  He states he had a bowel movement yesterday and denies nausea and vomiting.  He denies chest pain, shortness of breath, headache.    Abdominal Pain      Prior to Admission medications   Medication Sig Start Date End Date Taking? Authorizing Provider  ondansetron  (ZOFRAN -ODT) 4 MG disintegrating tablet Take 1 tablet (4 mg total) by mouth every 8 (eight) hours as needed for nausea or vomiting. 04/12/24  Yes Logan Ubaldo NOVAK, PA-C  oxyCODONE  (ROXICODONE ) 5 MG immediate release tablet Take 1 tablet (5 mg total) by mouth every 4 (four) hours as needed for severe pain (pain score 7-10). 04/12/24  Yes Logan Ubaldo B, PA-C  potassium chloride  SA (KLOR-CON  M) 20 MEQ tablet Take 1 tablet (20 mEq total) by mouth daily. 04/06/24   Rolan Ezra RAMAN, MD  tamsulosin  (FLOMAX ) 0.4 MG CAPS capsule Take 1 capsule (0.4 mg total) by mouth daily. 04/12/24  Yes Logan Ubaldo B, PA-C  albuterol  (VENTOLIN  HFA) 108 (90 Base) MCG/ACT inhaler INHALE 1-2 PUFFS BY MOUTH EVERY 6 HOURS AS NEEDED FOR WHEEZE OR SHORTNESS OF BREATH 11/22/23   Kassie Acquanetta Bradley, MD   ALPRAZolam  (XANAX ) 0.5 MG tablet Take 1 tablet (0.5 mg total) by mouth at bedtime. 02/13/24   Gonfa, Taye T, MD  ammonium lactate  (AMLACTIN) 12 % cream Apply 1 Application topically as needed for dry skin. 12/25/22   McDonald, Juliene SAUNDERS, DPM  amoxicillin -clavulanate (AUGMENTIN ) 875-125 MG tablet Take 1 tablet by mouth 2 (two) times daily. 03/26/24   Alvia Corean LITTIE, FNP  apixaban  (ELIQUIS ) 5 MG TABS tablet Take 1 tablet (5 mg total) by mouth 2 (two) times daily. 06/13/23   Rolan Ezra RAMAN, MD  ascorbic acid (VITAMIN C) 500 MG tablet Take 500 mg by mouth daily.    [provider]  augmented betamethasone  dipropionate (DIPROLENE -AF) 0.05 % cream Apply 1 application  topically daily as needed (to affected areas- for itching). 10/11/20   [provider]  carisoprodol  (SOMA ) 350 MG tablet Take 1 tablet (350 mg total) by mouth 4 (four) times daily as needed for muscle spasms. 02/13/24   Gonfa, Taye T, MD  Cholecalciferol  (VITAMIN D3) 50 MCG (2000 UT) TABS Take 2,000 Units by mouth daily.    [provider]  Cyanocobalamin  (VITAMIN B-12) 2500 MCG SUBL Place 2,500 mcg under the tongue daily.    [provider]  doxepin  (SINEQUAN ) 10 MG capsule Take 2 capsules (20 mg total) by mouth at bedtime. 04/08/24   Norleen Lynwood ORN, MD  DULoxetine  (CYMBALTA ) 60 MG capsule TAKE 1 CAPSULE BY MOUTH EVERY DAY 05/20/23  Norleen Lynwood ORN, MD  Dupilumab (DUPIXENT) 300 MG/2ML SOPN Inject 300 mg into the skin every 14 (fourteen) days.    [provider]  Evolocumab  (REPATHA  SURECLICK) 140 MG/ML SOAJ INJECT 140 MG INTO THE SKIN EVERY 14 DAYS 02/10/24   Rolan Ezra RAMAN, MD  fluticasone  (FLONASE ) 50 MCG/ACT nasal spray Place 2 sprays into both nostrils daily as needed for allergies or rhinitis. 07/17/23   Norleen Lynwood ORN, MD  gabapentin  (NEURONTIN ) 100 MG capsule Take 1 capsule (100 mg total) by mouth 3 (three) times daily. 01/16/24   Lamount Ethan CROME, DPM  ipratropium-albuterol  (DUONEB) 0.5-2.5 (3)  MG/3ML SOLN TAKE 3 ML BY NEBULIZATION EVERY 4 HOURS AS NEEDED (WHEEZING). 01/29/23   Norleen Lynwood ORN, MD  leptospermum manuka honey (MEDIHONEY) PSTE paste Apply 1 Application topically daily. 02/12/24   Gonfa, Taye T, MD  lidocaine  (LIDODERM ) 5 % Place 1 patch onto the skin daily. Remove & Discard patch within 12 hours or as directed by MD 04/08/24   Norleen Lynwood ORN, MD  linaclotide  (LINZESS ) 145 MCG CAPS capsule Take 1 capsule (145 mcg total) by mouth daily before breakfast. 03/20/24   Norleen Lynwood ORN, MD  losartan  (COZAAR ) 25 MG tablet Take 1 tablet (25 mg total) by mouth daily. 03/13/24   Rolan Ezra RAMAN, MD  metoprolol  tartrate (LOPRESSOR ) 25 MG tablet Take 0.5 tablets (12.5 mg total) by mouth 2 (two) times daily. 02/13/24   Gonfa, Taye T, MD  Multiple Vitamins-Minerals (SENIOR VITES PO) Take 1 tablet by mouth daily with breakfast.    [provider]  omeprazole  (PRILOSEC) 20 MG capsule TAKE 1 CAPSULE BY MOUTH TWICE A DAY 10/31/23   Norleen Lynwood ORN, MD  predniSONE  (DELTASONE ) 10 MG tablet Take 1 tablet (10 mg total) by mouth daily with breakfast. 04/08/24   Claudene Arthea HERO, DO  Tiotropium Bromide  Monohydrate (SPIRIVA  RESPIMAT) 2.5 MCG/ACT AERS Take 2 puffs by nebulization daily. Patient taking differently: Inhale 2 puffs into the lungs daily. 01/28/24   Ruthell Lauraine FALCON, NP  tirzepatide  (MOUNJARO ) 7.5 MG/0.5ML Pen Inject 7.5 mg into the skin once a week. 03/20/24   Norleen Lynwood ORN, MD  torsemide  (DEMADEX ) 20 MG tablet Take 3 tablets (60 mg total) by mouth 2 (two) times daily. 03/24/24   Rolan Ezra RAMAN, MD  traMADol  (ULTRAM ) 50 MG tablet Take 1 tablet (50 mg total) by mouth every 8 (eight) hours as needed. 03/20/24   Norleen Lynwood ORN, MD  TUMS 500 MG chewable tablet Chew 1 tablet by mouth 2 (two) times daily as needed for indigestion or heartburn. 10/19/16   [provider]  TYLENOL  500 MG tablet Take 2,000 mg by mouth in the morning and at bedtime.    [provider]  venlafaxine  XR (EFFEXOR -XR)  150 MG 24 hr capsule TAKE 1 CAPSULE BY MOUTH DAILY WITH BREAKFAST. 07/09/23   Norleen Lynwood ORN, MD  NAPOLEON INHUB 250-50 MCG/ACT AEPB INHALE 1 PUFF INTO THE LUNGS IN THE MORNING AND AT BEDTIME. 03/17/24   Kassie Acquanetta Bradley, MD  zolpidem  (AMBIEN ) 5 MG tablet Take 1 tablet (5 mg total) by mouth at bedtime as needed for sleep. 03/18/24   Norleen Lynwood ORN, MD    Allergies: Tape, Amiodarone , Statins, Sulfa  antibiotics, and Wound dressing adhesive    Review of Systems  Gastrointestinal:  Positive for abdominal pain.    Updated Vital Signs BP (!) 160/96   Pulse 70   Temp 97.9 F (36.6 C) (Oral)   Resp (!) 25  SpO2 97%   Physical Exam Vitals and nursing note reviewed.  Constitutional:      General: He is not in acute distress.    Appearance: He is well-developed.     Comments: Patient is sleepy but easily arousable  HENT:     Head: Normocephalic and atraumatic.   Eyes:     Conjunctiva/sclera: Conjunctivae normal.    Cardiovascular:     Rate and Rhythm: Normal rate and regular rhythm.     Heart sounds: No murmur heard. Pulmonary:     Effort: Pulmonary effort is normal. No respiratory distress.     Breath sounds: Normal breath sounds.  Abdominal:     Palpations: Abdomen is soft.     Tenderness: There is abdominal tenderness in the left lower quadrant.   Musculoskeletal:        General: No swelling.     Cervical back: Neck supple.   Skin:    General: Skin is warm and dry.     Capillary Refill: Capillary refill takes less than 2 seconds.   Neurological:     General: No focal deficit present.     Mental Status: He is alert and oriented to person, place, and time.   Psychiatric:        Mood and Affect: Mood normal.     (all labs ordered are listed, but only abnormal results are displayed) Labs Reviewed  CBC WITH DIFFERENTIAL/PLATELET - Abnormal; Notable for the following components:      Result Value   RBC 3.61 (*)    Hemoglobin 10.3 (*)    HCT 34.2 (*)    RDW 15.7 (*)     Abs Immature Granulocytes 0.08 (*)    All other components within normal limits  COMPREHENSIVE METABOLIC PANEL WITH GFR - Abnormal; Notable for the following components:   Sodium 134 (*)    Chloride 96 (*)    Glucose, Bld 109 (*)    Creatinine, Ser 1.27 (*)    Total Protein 5.7 (*)    Albumin  2.8 (*)    AST 67 (*)    ALT 74 (*)    All other components within normal limits  LIPASE, BLOOD  URINALYSIS, ROUTINE W REFLEX MICROSCOPIC    EKG: None  Radiology: CT ABDOMEN PELVIS W CONTRAST Result Date: 04/12/2024 CLINICAL DATA:  Left lower quadrant abdominal pain. EXAM: CT ABDOMEN AND PELVIS WITH CONTRAST TECHNIQUE: Multidetector CT imaging of the abdomen and pelvis was performed using the standard protocol following bolus administration of intravenous contrast. RADIATION DOSE REDUCTION: This exam was performed according to the departmental dose-optimization program which includes automated exposure control, adjustment of the mA and/or kV according to patient size and/or use of iterative reconstruction technique. CONTRAST:  75mL OMNIPAQUE  IOHEXOL  350 MG/ML SOLN COMPARISON:  01/24/2024. FINDINGS: Lower chest: Atelectasis/consolidation noted involving the left lower lobe. Atelectasis versus scar noted within the lingula and posterior right lower lobe. No pleural effusion identified Hepatobiliary: No focal liver abnormality is seen. No gallstones, gallbladder wall thickening, or biliary dilatation. Pancreas: Unremarkable. No pancreatic ductal dilatation or surrounding inflammatory changes. Spleen: Normal in size without focal abnormality. Adrenals/Urinary Tract: Normal adrenal glands. No hydronephrosis. Inferior pole left kidney cyst measures 3.7 cm, image 42/3. No follow-up imaging recommended. Bilateral kidney stones. The largest is in the inferior pole of left kidney measuring 0.9 cm. Unchanged mild bilateral perinephric soft tissue stranding. Bladder normal. Within the distal left ureter there is a  stone measuring 5 mm, image 78/3. New from previous exam. This  does not result in significant left-sided hydronephrosis or hydroureter. Stomach/Bowel: Stomach normal. No pathologic dilatation of the large or small bowel loops. The appendix is visualized and appears normal. Colonic diverticulosis. Most severe within the sigmoid colon. Signs of acute diverticulitis involving the distal descending colon with wall thickening and surrounding soft tissue stranding, image 63/3. No signs of perforation or abscess. Vascular/Lymphatic: Aortic atherosclerosis. No aneurysm. No signs of abdominopelvic adenopathy. Reproductive: Prostate is unremarkable. Other: No free fluid or fluid collections.  No free air. Musculoskeletal: No acute or significant osseous findings. Remote right posterior and lateral tenth rib fracture. Inferior endplate deformity involving the T11 vertebra with interval increase and sclerosis along the endplate compared with 01/24/2024. There is new mild superior endplate compression deformity involving the T12 vertebral with increased sclerosis along the endplate. Lumbar spondylosis. IMPRESSION: 1. Signs of acute diverticulitis involving the distal descending colon with wall thickening and surrounding soft tissue stranding. No signs of perforation or abscess. 2. There is a 5 mm stone within the distal left ureter which does not result in significant left-sided hydronephrosis or hydroureter. 3. Bilateral kidney stones. 4. Atelectasis/consolidation noted involving the left lower lobe. 5. Inferior endplate deformity involving the T11 vertebra with interval increase and sclerosis along the endplate compared with 01/24/2024. 6. There is new, age indeterminate mild superior endplate compression deformity involving the T12 vertebral with increased sclerosis along the superior endplate. 7.  Aortic Atherosclerosis (ICD10-I70.0). Electronically Signed   By: Waddell Calk M.D.   On: 04/12/2024 06:10     .Critical  Care  Performed by: Logan Ubaldo NOVAK, PA-C Authorized by: Logan Ubaldo NOVAK, PA-C   Critical care provider statement:    Critical care time (minutes):  30   Critical care time was exclusive of:  Separately billable procedures and treating other patients   Critical care was necessary to treat or prevent imminent or life-threatening deterioration of the following conditions:  Toxidrome (Hypotension, Systolic BP 70)   Critical care was time spent personally by me on the following activities:  Development of treatment plan with patient or surrogate, discussions with consultants, evaluation of patient's response to treatment, examination of patient, ordering and review of laboratory studies, ordering and review of radiographic studies, ordering and performing treatments and interventions, pulse oximetry, re-evaluation of patient's condition and review of old charts (Discussions with poison control)    Medications Ordered in the ED  ondansetron  (ZOFRAN ) injection 4 mg (4 mg Intravenous Given 04/12/24 0508)  iohexol  (OMNIPAQUE ) 350 MG/ML injection 75 mL (75 mLs Intravenous Contrast Given 04/12/24 0556)  sodium chloride  0.9 % bolus 1,000 mL (1,000 mLs Intravenous New Bag/Given 04/12/24 0614)    Clinical Course as of 04/12/24 0641  Sun Apr 12, 2024  0631 Sxs bradycardia from Tiazanadine OD. [CC]    Clinical Course User Index [CC] Jerral Meth, MD                                 Medical Decision Making Amount and/or Complexity of Data Reviewed Labs: ordered. Radiology: ordered.  Risk Prescription drug management.   This patient presents to the ED for concern of abdominal pain, this involves an extensive number of treatment options, and is a complaint that carries with it a high risk of complications and morbidity.  The differential diagnosis includes diverticulitis, bowel obstruction, colitis, others.  Family was also concerned about patient's mental status   Co morbidities / Chronic  conditions that complicate  the patient evaluation  Chronic pain, OSA, GERD   Additional history obtained:  Additional history obtained from EMR External records from outside source obtained and reviewed including medicine notes showing plans to discontinue tizanidine  due to concerns about polypharmacy   Lab Tests:  I Ordered, and personally interpreted labs.  The pertinent results include: Creatinine 1.27, AST 67, ALT 74   Imaging Studies ordered:  I ordered imaging studies including CT abdomen pelvis with contrast I independently visualized and interpreted imaging which showed  1. Signs of acute diverticulitis involving the distal descending  colon with wall thickening and surrounding soft tissue stranding. No  signs of perforation or abscess.  2. There is a 5 mm stone within the distal left ureter which does  not result in significant left-sided hydronephrosis or hydroureter.  3. Bilateral kidney stones.  4. Atelectasis/consolidation noted involving the left lower lobe.  5. Inferior endplate deformity involving the T11 vertebra with  interval increase and sclerosis along the endplate compared with  01/24/2024.  6. There is new, age indeterminate mild superior endplate  compression deformity involving the T12 vertebral with increased  sclerosis along the superior endplate.  7.  Aortic Atherosclerosis   I agree with the radiologist interpretation   Cardiac Monitoring: / EKG:  The patient was maintained on a cardiac monitor.  I personally viewed and interpreted the cardiac monitored which showed an underlying rhythm of: Sinus rhythm   Problem List / ED Course / Critical interventions / Medication management   I ordered medication including saline bolus, Zofran  Reevaluation of the patient after these medicines showed that the patient improved I have reviewed the patients home medicines and have made adjustments as needed   Consultations Obtained:  I requested  consultation with the [facility], they recommend fluids for hypotension, observation until blood pressure is stable and patient is able to stand without significant blood pressure changes  Social Determinants of Health:  Patient is a former smoker   Test / Admission - Considered:  Patient care being signed out to Dorn Dec, PA-C at shift handoff.  Patient will need observation for a few hours until he is able to stand without hypotension.  Patient is currently hypotensive requiring fluid boluses for blood pressure support.  No further specific recommendations from poison control.  Patient will need to discontinue the tizanidine  dose as recommended by his previous provider. Patient has imaging showing 5 mm stone in the left ureter with no hydronephrosis.  No hydroureter.  Patient has no white count and is afebrile at this time.  But will need urinalysis to evaluate for signs of infection.  Plan to discharge with prescription for oxycodone  for breakthrough pain, Flomax , Zofran .      Final diagnoses:  Accidental overdose, initial encounter  Hypotension due to drugs  Left ureteral stone    ED Discharge Orders          Ordered    oxyCODONE  (ROXICODONE ) 5 MG immediate release tablet  Every 4 hours PRN        04/12/24 0620    tamsulosin  (FLOMAX ) 0.4 MG CAPS capsule  Daily        04/12/24 0620    ondansetron  (ZOFRAN -ODT) 4 MG disintegrating tablet  Every 8 hours PRN        04/12/24 0620               Logan Ubaldo NOVAK, PA-C 04/12/24 9358    Jerral Meth, MD 04/12/24 2306

## 2024-04-12 NOTE — ED Triage Notes (Signed)
 Pt arrived with EMS from home for accidental OD on tizanidine . Is supposed to take a max of 3 pills a day and at midnight wife reported pt had took 6 pills. Initially called out for unresponsive, however pt was lethargic but responsive to verbal on their arrival.  C/o lower abd pain at home. Bp 162/93, pulse 76, cbg 135

## 2024-04-12 NOTE — Discharge Instructions (Addendum)
 Your provider has recommended that you discontinue the use of tizanidine  due to worries about sedative effects.  I recommend that you discontinue the use of this medication and follow-up with your primary care team for further recommendations. You were diagnosed today with a kidney stone.  I have prescribed oxycodone  for breakthrough pain, Zofran  to be taken as needed for nausea, and Flomax  to be taken daily.  I have also provided contact information for urology for follow-up.  Return to the ED immediately if you develop fever, uncontrolled pain or vomiting, or other concerns.

## 2024-04-12 NOTE — ED Notes (Signed)
 BP in the 70s, edp aware. Orders placed. Pt moved to room and placed on cardiac monitor.

## 2024-04-12 NOTE — H&P (Signed)
 NAME:  [name], MRN:  [id number], DOB:  1956-04-03, LOS: 0 ADMISSION DATE:  04/12/2024, CONSULTATION DATE:  04/12/24 REFERRING MD:  ED, CHIEF COMPLAINT:  LLQ pain   History of Present Illness:  68 year old man brought to the ED via concern for unintentional tizanidine  overdose with complaints of left lower quadrant abdominal pain found to have diverticulitis and low blood pressure convert septic shock related to diverticulitis versus polypharmacy.  Denies taking too many Zanaflex .  States he took as many as normal.  Per report he took double.  To me he complains of left lower quadrant abdominal pain.  Present for a while.  That prompted per his report his presentation to the ED.  CT abdomen pelvis reveals diverticulitis.  He was briefly on vasopressors.  For low blood pressure.  Got 1 L of crystalloid.  He has since been weaned off vasopressors.  He denies any worsening shortness of breath.  He is on baseline 2 L nasal cannula at home and continue this now.  Without any worsening symptoms.  Labs notable for mild increase in creatinine 1.2 from baseline 1 or so.  Mild increase in LFTs.  T. bili within normal limits.  CBC with baseline anemia, no leukocytosis.  Pertinent  Medical History  As per EMR and discussed below  Significant Hospital Events: Including procedures, antibiotic start and stop dates in addition to other pertinent events   6/2 9 admitted to the hospital initially brought for concern for Zanaflex  overdose, he states he came with left lower quadrant abdominal pain  Interim History / Subjective:    Objective    Blood pressure (!) 139/94, pulse 70, temperature 98.4 F (36.9 C), temperature source Oral, resp. rate 15, weight 112.2 kg, SpO2 96%.       No intake or output data in the 24 hours ending 04/12/24 1125 Filed Weights   04/12/24 0952  Weight: 112.2 kg    Examination: General: Chronically ill-appearing lying in bed HENT: Atraumatic normocephalic Lungs: Clear  bilaterally normal work of breathing on home 2 L Cardiovascular: Regular rate and rhythm, no murmur appreciated Abdomen: Mild distention minimal tenderness to palpation left lower quadrant Neuro: Alert oriented no focal deficits noted  Resolved problem list   Assessment and Plan   Septic shock related to diverticulitis: -- Status post ceftriaxone  in the ED, Zosyn ordered --Follow-up blood cultures -- MAP greater than 65, weaned off norepinephrine  after fluid resuscitation, gentle fluid resuscitation given history of heart failure reduced EF  Chronic pain: Some concern for polypharmacy leading to presentation --Hold Zanaflex , resume home trazodone , Linzess , gabapentin   Heart failure with reduced EF: -- Hold Lasix  and other antihypertensive/GDMT with low BPs requiring pressors earlier  Prediabetes: Recent A1c 6.0. -- Glucose checks with SSI  Reported obstructive lung disease: -- ICS LAMA LABA via nebulizer, as needed bronchodilators as well to substitute home inhalers  Paroxysmal A-fib: -- Hold metoprolol , resume Eliquis   Best Practice (right click and Reselect all SmartList Selections daily)   Diet/type: Regular consistency (see orders) DVT prophylaxis DOAC Pressure ulcer(s): pressure ulcer assessment deferred  GI prophylaxis: N/A Lines: N/A Foley:  N/A Code Status:  full code Last date of multidisciplinary goals of care discussion [at time of admission]  Labs   CBC: Recent Labs  Lab 04/12/24 0414  WBC 8.6  NEUTROABS 6.0  HGB 10.3*  HCT 34.2*  MCV 94.7  PLT 296    Basic Metabolic Panel: Recent Labs  Lab 04/12/24 0414  NA 134*  K 4.1  CL 96*  CO2 29  GLUCOSE 109*  BUN 23  CREATININE 1.27*  CALCIUM  9.4   GFR: Estimated Creatinine Clearance: 68.9 mL/min (A) (by C-G formula based on SCr of 1.27 mg/dL (H)). Recent Labs  Lab 04/12/24 0414 04/12/24 0744  WBC 8.6  --   LATICACIDVEN  --  1.5    Liver Function Tests: Recent Labs  Lab 04/12/24 0414   AST 67*  ALT 74*  ALKPHOS 78  BILITOT 0.9  PROT 5.7*  ALBUMIN  2.8*   Recent Labs  Lab 04/12/24 0414  LIPASE 40   No results for input(s): AMMONIA in the last 168 hours.  ABG    Component Value Date/Time   PHART 7.342 (L) 02/09/2024 1423   PCO2ART 47.3 02/09/2024 1423   PO2ART 66 (L) 02/09/2024 1423   HCO3 25.6 02/09/2024 1423   TCO2 27 02/09/2024 1423   ACIDBASEDEF 3.0 (H) 02/08/2024 0219   O2SAT 91 02/09/2024 1423     Coagulation Profile: No results for input(s): INR, PROTIME in the last 168 hours.  Cardiac Enzymes: No results for input(s): CKTOTAL, CKMB, CKMBINDEX, TROPONINI in the last 168 hours.  HbA1C: Hgb A1c MFr Bld  Date/Time Value Ref Range Status  01/07/2024 03:46 PM 6.0 4.6 - 6.5 % Final    Comment:    Glycemic Control Guidelines for People with Diabetes:Non Diabetic:  <6%Goal of Therapy: <7%Additional Action Suggested:  >8%   07/01/2023 02:39 PM 5.9 4.6 - 6.5 % Final    Comment:    Glycemic Control Guidelines for People with Diabetes:Non Diabetic:  <6%Goal of Therapy: <7%Additional Action Suggested:  >8%     CBG: No results for input(s): GLUCAP in the last 168 hours.  Review of Systems:   No chest pain, no orthopnea or PND.  Comprehensive review of systems otherwise negative.  Past Medical History:  He,  has a past medical history of AICD (automatic cardioverter/defibrillator) present, Anemia, ANXIETY, Arthritis, Asthma, Cardiomyopathy (HCC), Chronic constipation, COPD (chronic obstructive pulmonary disease) (HCC), COVID-19 (12/09/2019), DEPRESSION, Diverticulitis, DYSKINESIA, ESOPHAGUS, Essential hypertension, FIBROMYALGIA, GERD (gastroesophageal reflux disease), Glaucoma, HYPERLIPIDEMIA, INSOMNIA, Myocardial infarction (HCC), O2 dependent, Paroxysmal atrial fibrillation (HCC), Peripheral neuropathy, Pneumonia (12/2016), Rash and other nonspecific skin eruption (04/12/2009), SLEEP APNEA, OBSTRUCTIVE, Syncope, and Type II diabetes  mellitus (HCC).   Surgical History:   Past Surgical History:  Procedure Laterality Date   ACNE CYST REMOVAL     2 on back    AV NODE ABLATION N/A 10/25/2017   Procedure: AV NODE ABLATION;  Surgeon: Fernande Elspeth BROCKS, MD;  Location: [facility] INVASIVE CV LAB;  Service: Cardiovascular;  Laterality: N/A;   BIV ICD INSERTION CRT-D N/A 09/18/2017   Procedure: BIV ICD INSERTION CRT-D;  Surgeon: Fernande Elspeth BROCKS, MD;  Location: [facility] INVASIVE CV LAB;  Service: Cardiovascular;  Laterality: N/A;   CARDIAC CATHETERIZATION N/A 03/21/2016   Procedure: Right/Left Heart Cath and Coronary Angiography;  Surgeon: Ezra GORMAN Shuck, MD;  Location: [facility] INVASIVE CV LAB;  Service: Cardiovascular;  Laterality: N/A;   CARDIOVERSION  04/18/2012   Procedure: CARDIOVERSION;  Surgeon: Vina LULLA Gull, MD;  Location: [facility] OR;  Service: Cardiovascular;  Laterality: N/A;   CARDIOVERSION  04/25/2012   Procedure: CARDIOVERSION;  Surgeon: Aleene JINNY Passe, MD;  Location: [facility] ENDOSCOPY;  Service: Cardiovascular;  Laterality: N/A;   CARDIOVERSION  04/25/2012   Procedure: CARDIOVERSION;  Surgeon: Vina LULLA Gull, MD;  Location: [facility] OR;  Service: Cardiovascular;  Laterality: N/A;   CARDIOVERSION  05/09/2012   Procedure: CARDIOVERSION;  Surgeon: Ozell  Wonda, MD;  Location: [facility] OR;  Service: Cardiovascular;  Laterality: N/A;  changed from crenshaw to cooper by trish/leone-endo   CARDIOVERSION N/A 03/15/2015   Procedure: CARDIOVERSION;  Surgeon: Aleene JINNY Passe, MD;  Location: [facility] ENDOSCOPY;  Service: Cardiovascular;  Laterality: N/A;   COLONOSCOPY     COLONOSCOPY WITH PROPOFOL  N/A 10/21/2014   Procedure: COLONOSCOPY WITH PROPOFOL ;  Surgeon: Gwendlyn ONEIDA Buddy, MD;  Location: [facility] ENDOSCOPY;  Service: Endoscopy;  Laterality: N/A;   EP IMPLANTABLE DEVICE N/A 04/06/2015   Procedure: Loop Recorder Insertion;  Surgeon: Danelle LELON Birmingham, MD;  Location: [facility] INVASIVE CV LAB;  Service: Cardiovascular;  Laterality: N/A;   ESOPHAGOGASTRODUODENOSCOPY     JOINT REPLACEMENT     LOOP  RECORDER REMOVAL N/A 09/18/2017   Procedure: LOOP RECORDER REMOVAL;  Surgeon: Fernande Elspeth BROCKS, MD;  Location: [facility] INVASIVE CV LAB;  Service: Cardiovascular;  Laterality: N/A;   RIGHT/LEFT HEART CATH AND CORONARY ANGIOGRAPHY N/A 01/28/2017   Procedure: Right/Left Heart Cath and Coronary Angiography;  Surgeon: Ezra GORMAN Shuck, MD;  Location: [facility] INVASIVE CV LAB;  Service: Cardiovascular;  Laterality: N/A;   TEE WITHOUT CARDIOVERSION  04/25/2012   Procedure: TRANSESOPHAGEAL ECHOCARDIOGRAM (TEE);  Surgeon: Aleene JINNY Passe, MD;  Location: [facility] ENDOSCOPY;  Service: Cardiovascular;  Laterality: N/A;   TEE WITHOUT CARDIOVERSION N/A 03/15/2015   Procedure: TRANSESOPHAGEAL ECHOCARDIOGRAM (TEE);  Surgeon: Aleene JINNY Passe, MD;  Location: [facility] ENDOSCOPY;  Service: Cardiovascular;  Laterality: N/A;   TONSILLECTOMY AND ADENOIDECTOMY     TOTAL KNEE ARTHROPLASTY Right 06/15/2014   Procedure: TOTAL KNEE ARTHROPLASTY;  Surgeon: Evalene JONETTA Chancy, MD;  Location: [facility] OR;  Service: Orthopedics;  Laterality: Right;   TOTAL KNEE ARTHROPLASTY Left 10/17/2021   Procedure: Left TOTAL KNEE ARTHROPLASTY;  Surgeon: Vernetta Lonni GRADE, MD;  Location: [facility] OR;  Service: Orthopedics;  Laterality: Left;     Social History:   reports that he quit smoking about 16 years ago. His smoking use included cigarettes. He started smoking about 46 years ago. He has a 60 pack-year smoking history. He has never used smokeless tobacco. He reports that he does not drink alcohol and does not use drugs.   Family History:  His family history includes Allergies in his mother; Asthma in his maternal grandmother and mother; COPD in his mother; Colon polyps in his mother; Hypothyroidism in his mother. There is no history of Colon cancer.   Allergies Allergies  Allergen Reactions   Tape Other (See Comments)    SKIN TEARS VERY, VERY EASILY!!!! Use Paper Tape Only, PLEASE!!!   Amiodarone  Other (See Comments)    Caused hyperthyroidism    Statins Other (See  Comments)    Myalgias    Sulfa  Antibiotics Other (See Comments)    Mouth ulcers   Wound Dressing Adhesive Other (See Comments)    Skin Tears Use Paper Tape Only     Home Medications  Prior to Admission medications   Medication Sig Start Date End Date Taking? Authorizing Provider  ondansetron  (ZOFRAN -ODT) 4 MG disintegrating tablet Take 1 tablet (4 mg total) by mouth every 8 (eight) hours as needed for nausea or vomiting. 04/12/24  Yes Logan Ubaldo NOVAK, PA-C  oxyCODONE  (ROXICODONE ) 5 MG immediate release tablet Take 1 tablet (5 mg total) by mouth every 4 (four) hours as needed for severe pain (pain score 7-10). 04/12/24  Yes McCauley, Ubaldo NOVAK, PA-C  potassium chloride  SA (KLOR-CON  M) 20 MEQ tablet Take 1 tablet (20 mEq total) by mouth daily. 04/06/24   Shuck Ezra  S, MD  tamsulosin  (FLOMAX ) 0.4 MG CAPS capsule Take 1 capsule (0.4 mg total) by mouth daily. 04/12/24  Yes Logan Ubaldo NOVAK, PA-C  albuterol  (VENTOLIN  HFA) 108 (90 Base) MCG/ACT inhaler INHALE 1-2 PUFFS BY MOUTH EVERY 6 HOURS AS NEEDED FOR WHEEZE OR SHORTNESS OF BREATH 11/22/23   Kassie Acquanetta Bradley, MD  ALPRAZolam  (XANAX ) 0.5 MG tablet Take 1 tablet (0.5 mg total) by mouth at bedtime. 02/13/24   Gonfa, Taye T, MD  ammonium lactate  (AMLACTIN) 12 % cream Apply 1 Application topically as needed for dry skin. 12/25/22   McDonald, Juliene SAUNDERS, DPM  amoxicillin -clavulanate (AUGMENTIN ) 875-125 MG tablet Take 1 tablet by mouth 2 (two) times daily. 03/26/24   Alvia Corean CROME, FNP  apixaban  (ELIQUIS ) 5 MG TABS tablet Take 1 tablet (5 mg total) by mouth 2 (two) times daily. 06/13/23   Rolan Ezra RAMAN, MD  ascorbic acid (VITAMIN C) 500 MG tablet Take 500 mg by mouth daily.    [provider]  augmented betamethasone  dipropionate (DIPROLENE -AF) 0.05 % cream Apply 1 application  topically daily as needed (to affected areas- for itching). 10/11/20   [provider]  carisoprodol  (SOMA ) 350 MG tablet Take 1 tablet (350 mg total) by mouth  4 (four) times daily as needed for muscle spasms. 02/13/24   Gonfa, Taye T, MD  Cholecalciferol  (VITAMIN D3) 50 MCG (2000 UT) TABS Take 2,000 Units by mouth daily.    [provider]  Cyanocobalamin  (VITAMIN B-12) 2500 MCG SUBL Place 2,500 mcg under the tongue daily.    [provider]  doxepin  (SINEQUAN ) 10 MG capsule Take 2 capsules (20 mg total) by mouth at bedtime. 04/08/24   Norleen Lynwood ORN, MD  DULoxetine  (CYMBALTA ) 60 MG capsule TAKE 1 CAPSULE BY MOUTH EVERY DAY 05/20/23   Norleen Lynwood ORN, MD  Dupilumab (DUPIXENT) 300 MG/2ML SOPN Inject 300 mg into the skin every 14 (fourteen) days.    [provider]  Evolocumab  (REPATHA  SURECLICK) 140 MG/ML SOAJ INJECT 140 MG INTO THE SKIN EVERY 14 DAYS 02/10/24   Rolan Ezra RAMAN, MD  fluconazole  (DIFLUCAN ) 100 MG tablet Take 100 mg by mouth daily. 03/26/24   [provider]  fluticasone  (FLONASE ) 50 MCG/ACT nasal spray Place 2 sprays into both nostrils daily as needed for allergies or rhinitis. 07/17/23   Norleen Lynwood ORN, MD  FUROSCIX  80 MG/10ML CTKT Inject into the skin. 02/17/24   [provider]  gabapentin  (NEURONTIN ) 100 MG capsule Take 1 capsule (100 mg total) by mouth 3 (three) times daily. 01/16/24   Lamount Ethan CROME, DPM  ipratropium-albuterol  (DUONEB) 0.5-2.5 (3) MG/3ML SOLN TAKE 3 ML BY NEBULIZATION EVERY 4 HOURS AS NEEDED (WHEEZING). 01/29/23   Norleen Lynwood ORN, MD  leptospermum manuka honey (MEDIHONEY) PSTE paste Apply 1 Application topically daily. 02/12/24   Gonfa, Taye T, MD  lidocaine  (LIDODERM ) 5 % Place 1 patch onto the skin daily. Remove & Discard patch within 12 hours or as directed by MD 04/08/24   Norleen Lynwood ORN, MD  linaclotide  (LINZESS ) 145 MCG CAPS capsule Take 1 capsule (145 mcg total) by mouth daily before breakfast. 03/20/24   Norleen Lynwood ORN, MD  losartan  (COZAAR ) 25 MG tablet Take 1 tablet (25 mg total) by mouth daily. 03/13/24   Rolan Ezra RAMAN, MD  metoprolol  tartrate (LOPRESSOR ) 25 MG tablet Take 0.5 tablets  (12.5 mg total) by mouth 2 (two) times daily. 02/13/24   Gonfa, Taye T, MD  Multiple Vitamins-Minerals (SENIOR VITES PO) Take 1  tablet by mouth daily with breakfast.    [provider]  omeprazole  (PRILOSEC) 20 MG capsule TAKE 1 CAPSULE BY MOUTH TWICE A DAY 10/31/23   Norleen Lynwood ORN, MD  predniSONE  (DELTASONE ) 10 MG tablet Take 1 tablet (10 mg total) by mouth daily with breakfast. 04/08/24   Claudene Arthea HERO, DO  Tiotropium Bromide  Monohydrate (SPIRIVA  RESPIMAT) 2.5 MCG/ACT AERS Take 2 puffs by nebulization daily. 01/28/24   Ruthell Lauraine FALCON, NP  tirzepatide  (MOUNJARO ) 7.5 MG/0.5ML Pen Inject 7.5 mg into the skin once a week. 03/20/24   Norleen Lynwood ORN, MD  tiZANidine  (ZANAFLEX ) 4 MG tablet Take 4 mg by mouth 3 (three) times daily. 04/07/24   [provider]  torsemide  (DEMADEX ) 20 MG tablet Take 3 tablets (60 mg total) by mouth 2 (two) times daily. 03/24/24   Rolan Ezra RAMAN, MD  traMADol  (ULTRAM ) 50 MG tablet Take 1 tablet (50 mg total) by mouth every 8 (eight) hours as needed. 03/20/24   Norleen Lynwood ORN, MD  TUMS 500 MG chewable tablet Chew 1 tablet by mouth 2 (two) times daily as needed for indigestion or heartburn. 10/19/16   [provider]  TYLENOL  500 MG tablet Take 2,000 mg by mouth in the morning and at bedtime.    [provider]  venlafaxine  XR (EFFEXOR -XR) 150 MG 24 hr capsule TAKE 1 CAPSULE BY MOUTH DAILY WITH BREAKFAST. 07/09/23   Norleen Lynwood ORN, MD  NAPOLEON INHUB 250-50 MCG/ACT AEPB INHALE 1 PUFF INTO THE LUNGS IN THE MORNING AND AT BEDTIME. 03/17/24   Kassie Acquanetta Bradley, MD  zolpidem  (AMBIEN ) 5 MG tablet Take 1 tablet (5 mg total) by mouth at bedtime as needed for sleep. 03/18/24   Norleen Lynwood ORN, MD     Critical care time:    CRITICAL CARE Performed by: Donnice JONELLE Beals   Total critical care time: 33 minutes  Critical care time was exclusive of separately billable procedures and treating other patients.  Critical care was necessary to treat or prevent imminent  or life-threatening deterioration.  Critical care was time spent personally by me on the following activities: development of treatment plan with patient and/or surrogate as well as nursing, discussions with consultants, evaluation of patient's response to treatment, examination of patient, obtaining history from patient or surrogate, ordering and performing treatments and interventions, ordering and review of laboratory studies, ordering and review of radiographic studies, pulse oximetry and re-evaluation of patient's condition.  Donnice JONELLE Beals, MD See Tracey

## 2024-04-12 NOTE — ED Provider Notes (Signed)
 Physical Exam  BP (!) 160/96   Pulse 70   Temp 97.9 F (36.6 C) (Oral)   Resp (!) 25   SpO2 97%   Physical Exam Vitals and nursing note reviewed.  Constitutional:      General: He is not in acute distress.    Appearance: Normal appearance.  HENT:     Head: Normocephalic and atraumatic.     Mouth/Throat:     Mouth: Mucous membranes are moist.     Pharynx: Oropharynx is clear.   Eyes:     Extraocular Movements: Extraocular movements intact.     Conjunctiva/sclera: Conjunctivae normal.     Pupils: Pupils are equal, round, and reactive to light.    Cardiovascular:     Rate and Rhythm: Normal rate and regular rhythm.     Pulses:          Carotid pulses are 2+ on the right side and 2+ on the left side.      Radial pulses are 1+ on the right side and 1+ on the left side.     Heart sounds: Normal heart sounds. No murmur heard.    No friction rub. No gallop.     Comments: On assessment, heart rate was normal however blood pressure is hypotensive. Pulmonary:     Effort: Pulmonary effort is normal.     Breath sounds: Normal breath sounds.  Abdominal:     General: Abdomen is flat. Bowel sounds are normal.     Palpations: Abdomen is soft.   Musculoskeletal:        General: Normal range of motion.     Cervical back: Normal range of motion and neck supple.     Right lower leg: No edema.     Left lower leg: No edema.   Skin:    General: Skin is warm and dry.     Capillary Refill: Capillary refill takes less than 2 seconds.   Neurological:     General: No focal deficit present.     Mental Status: He is oriented to person, place, and time. He is lethargic.     GCS: GCS eye subscore is 4. GCS verbal subscore is 5. GCS motor subscore is 6.     Comments: Patient is lethargic though easily awakened full and fully responsive and oriented.  Psychiatric:        Mood and Affect: Mood normal.     Procedures  Procedures  ED Course / MDM   Clinical Course as of 04/12/24 0632   Sun Apr 12, 2024  0631 Sxs bradycardia from Tiazanadine OD. [CC]    Clinical Course User Index [CC] Jerral Meth, MD   Medical Decision Making Amount and/or Complexity of Data Reviewed Labs: ordered. Radiology: ordered.  Risk Prescription drug management. Decision regarding hospitalization.  Care assumed from L.  McCauley, PA-C.  This is a 68 year old male patient who presented to the ED for left lower quadrant pain, also report that he took 6 Zanaflex  tablets prior to going to bed last night.  During the course of his ED admission, he has become hypotensive and has required fluid resuscitation to maintain blood pressures.  In consultation with poison control, their recommendations to previous care team was to observe and provide fluid boluses, consider pressors if needed for resistant hypotension.  Plan at this time is to continue to monitor blood pressure, consider adding pressors as needed.  At reassessment the patient, patient is hypotensive despite ongoing fluid boluses.  Initiated Levophed  infusion, at  this time we will consult to intensivist for ICU admission.  Consulted w/ critical care for admit consultation; at time of re-eval, he had positive response to fluids and Levo, normotensive.  Critical care will accept for admission secondary to vasopressor requirement.      Myriam Dorn BROCKS, [place] 04/12/24 [id number]    Charlyn Sora, MD 04/14/24 [phone]

## 2024-04-12 NOTE — Progress Notes (Signed)
 eLink Physician-Brief Progress Note Patient Name: [name] DOB: [birth year] MRN: [id number]   Date of Service  04/12/2024  HPI/Events of Note  68 year old man brought to the ED via concern for unintentional tizanidine  overdose   Complaining of heartburn requesting Tums  eICU Interventions  Add Tums as needed     Intervention Category Minor Interventions: Routine modifications to care plan (e.g. PRN medications for pain, fever)  Lotta Frankenfield 04/12/2024, 10:42 PM

## 2024-04-13 DIAGNOSIS — I952 Hypotension due to drugs: Secondary | ICD-10-CM

## 2024-04-13 DIAGNOSIS — I5022 Chronic systolic (congestive) heart failure: Secondary | ICD-10-CM

## 2024-04-13 DIAGNOSIS — A419 Sepsis, unspecified organism: Secondary | ICD-10-CM | POA: Diagnosis not present

## 2024-04-13 DIAGNOSIS — T50901A Poisoning by unspecified drugs, medicaments and biological substances, accidental (unintentional), initial encounter: Secondary | ICD-10-CM | POA: Diagnosis not present

## 2024-04-13 DIAGNOSIS — R571 Hypovolemic shock: Secondary | ICD-10-CM

## 2024-04-13 DIAGNOSIS — K5732 Diverticulitis of large intestine without perforation or abscess without bleeding: Secondary | ICD-10-CM

## 2024-04-13 LAB — CBC
HCT: 37.8 % — ABNORMAL LOW (ref 39.0–52.0)
Hemoglobin: 11.4 g/dL — ABNORMAL LOW (ref 13.0–17.0)
MCH: 29 pg (ref 26.0–34.0)
MCHC: 30.2 g/dL (ref 30.0–36.0)
MCV: 96.2 fL (ref 80.0–100.0)
Platelets: 310 10*3/uL (ref 150–400)
RBC: 3.93 MIL/uL — ABNORMAL LOW (ref 4.22–5.81)
RDW: 15.7 % — ABNORMAL HIGH (ref 11.5–15.5)
WBC: 9 10*3/uL (ref 4.0–10.5)
nRBC: 0 % (ref 0.0–0.2)

## 2024-04-13 LAB — BASIC METABOLIC PANEL WITH GFR
Anion gap: 8 (ref 5–15)
BUN: 17 mg/dL (ref 8–23)
CO2: 29 mmol/L (ref 22–32)
Calcium: 9.4 mg/dL (ref 8.9–10.3)
Chloride: 96 mmol/L — ABNORMAL LOW (ref 98–111)
Creatinine, Ser: 1.05 mg/dL (ref 0.61–1.24)
GFR, Estimated: >60 mL/min (ref >60)
Glucose, Bld: 92 mg/dL (ref 70–99)
Potassium: 4.5 mmol/L (ref 3.5–5.1)
Sodium: 133 mmol/L — ABNORMAL LOW (ref 135–145)

## 2024-04-13 LAB — GLUCOSE, CAPILLARY
Glucose-Capillary: 83 mg/dL (ref 70–99)
Glucose-Capillary: 77 mg/dL (ref 70–99)
Glucose-Capillary: 108 mg/dL — ABNORMAL HIGH (ref 70–99)
Glucose-Capillary: 81 mg/dL (ref 70–99)
Glucose-Capillary: 77 mg/dL (ref 70–99)

## 2024-04-13 LAB — PHOSPHORUS: Phosphorus: 4.1 mg/dL (ref 2.5–4.6)

## 2024-04-13 LAB — MAGNESIUM: Magnesium: 2.3 mg/dL (ref 1.7–2.4)

## 2024-04-13 MED ORDER — TORSEMIDE 20 MG PO TABS
80.0000 mg | ORAL_TABLET | Freq: Two times a day (BID) | ORAL | Status: DC
  Administered 2024-04-13 – 2024-04-17 (×6): 80 mg via ORAL
  Filled 2024-04-13 (×9): qty 4

## 2024-04-13 NOTE — Evaluation (Signed)
 Occupational Therapy Evaluation Patient Details Name: [name] MRN: [id number] DOB: [birth year] Today's Date: 04/13/2024   History of Present Illness   68 year old man brought to the ED via concern for unintentional tizanidine  overdose with complaints of left lower quadrant abdominal pain found to have diverticulitis and low blood pressure convert septic shock related to diverticulitis versus polypharmacy. PMH: morbid obesity, OSA, chronic pain syndrome, GERD, fibromyalgia, COPD     Clinical Impressions PTA patient reports using RW for mobility (limited distances) and able to manage most ADLs but his girlfriend can assist as needed.  Pt manages meds, his finances and reports tends to order takeout for meals.  Today, he demonstrates ability to complete bed mobility with mod assist, transfers with min assist, and requires setup to max assist for Adls. He is on 3L upon entry via HFNC, but desaturates into low 80s with activity and needed 4L to maintain saturations >90%; on 3L resting upon exit with Spo2 stable.  HR stable, BP WFL (soft when taken during activity but likely not accurate).  Based on performance today, hopeful pt will progress to [facility] level prior to dc home but if he does not progress may need to think about inpatient setting with <3hrs/day. Will follow acutely.      If plan is discharge home, recommend the following:   A little help with walking and/or transfers;A lot of help with bathing/dressing/bathroom;Assistance with cooking/housework;Assist for transportation;Help with stairs or ramp for entrance     Functional Status Assessment   Patient has had a recent decline in their functional status and demonstrates the ability to make significant improvements in function in a reasonable and predictable amount of time.     Equipment Recommendations   None recommended by OT     Recommendations for Other Services         Precautions/Restrictions    Precautions Precautions: Fall Recall of Precautions/Restrictions: Impaired Restrictions Weight Bearing Restrictions Per Provider Order: No     Mobility Bed Mobility Overal bed mobility: Needs Assistance Bed Mobility: Supine to Sit, Sit to Supine     Supine to sit: Min assist Sit to supine: Mod assist   General bed mobility comments: Cues for technique, min assist to pull through therapist's hand and a little assist to scoot to EOB squarely. increased time required.  Mod assist to return to supine for trunk and LB support.    Transfers Overall transfer level: Needs assistance Equipment used: Rolling walker (2 wheels) Transfers: Sit to/from Stand, Bed to chair/wheelchair/BSC Sit to Stand: Min assist           General transfer comment: min assist to power up from EOB, using momentum and assist to stabilize once upright.  poor tolerance      Balance Overall balance assessment: Needs assistance Sitting-balance support: Feet supported, No upper extremity supported Sitting balance-Leahy Scale: Fair     Standing balance support: Bilateral upper extremity supported Standing balance-Leahy Scale: Poor                             ADL either performed or assessed with clinical judgement   ADL Overall ADL's : Needs assistance/impaired     Grooming: Set up;Sitting           Upper Body Dressing : Minimal assistance;Sitting   Lower Body Dressing: Maximal assistance;Sit to/from stand   Toilet Transfer: Minimal assistance Toilet Transfer Details (indicate cue type and reason): simulated in room  Functional mobility during ADLs: Minimal assistance;Moderate assistance;Rolling walker (2 wheels);Cueing for safety;Cueing for sequencing       Vision   Vision Assessment?: No apparent visual deficits     Perception         Praxis         Pertinent Vitals/Pain Pain Assessment Pain Assessment: Faces Faces Pain Scale: Hurts little more Pain  Location: neck Pain Descriptors / Indicators: Aching Pain Intervention(s): Limited activity within patient's tolerance, Monitored during session, Repositioned     Extremity/Trunk Assessment Upper Extremity Assessment Upper Extremity Assessment: Generalized weakness   Lower Extremity Assessment Lower Extremity Assessment: Defer to PT evaluation   Cervical / Trunk Assessment Cervical / Trunk Assessment: Other exceptions Cervical / Trunk Exceptions: R head/neck positioning   Communication Communication Communication: No apparent difficulties   Cognition Arousal: Alert Behavior During Therapy: WFL for tasks assessed/performed Cognition: No apparent impairments             OT - Cognition Comments: pt following commands and engaging appropriately.  appears [facility]                 Following commands: Impaired Following commands impaired: Follows multi-step commands with increased time, Follows multi-step commands inconsistently     Cueing  General Comments   Cueing Techniques: Verbal cues;Gestural cues  pt on 3L via [place] upon entry, with activity had to increased to 4L due to SpO2 dipping into low 80s.  Able to leave on 3L upon exit.  BP stable, soft when taking during mobility but recovered after back to supine. HR stable.   Exercises     Shoulder Instructions      Home Living Family/patient expects to be discharged to:: Private residence Living Arrangements: Spouse/significant other Available Help at Discharge: Family;Available 24 hours/day Type of Home: House Home Access: Stairs to enter [employer] of Steps: 1 Entrance Stairs-Rails: Right Home Layout: One level     Bathroom Shower/Tub: Producer, television/film/video: Standard Bathroom Accessibility: Yes   Home Equipment: Pharmacist, hospital (2 wheels);Cane - single point;Grab bars - tub/shower;Hand held shower head;Wheelchair - manual;BSC/3in1   Additional Comments: Pt reports he  typically sleeps in his recliner.      Prior Functioning/Environment Prior Level of Function : Needs assist             Mobility Comments: RW to ambulate. Hx of falls ADLs Comments: Reports girlfriend occasionally assisting with ADLs but can typically do them himself.    OT Problem List: Decreased strength;Decreased activity tolerance;Impaired balance (sitting and/or standing);Pain;Obesity;Decreased knowledge of precautions;Decreased knowledge of use of DME or AE;Cardiopulmonary status limiting activity   OT Treatment/Interventions: Self-care/ADL training;Therapeutic exercise;DME and/or AE instruction;Therapeutic activities;Cognitive remediation/compensation;Balance training;Patient/family education;Energy conservation      OT Goals(Current goals can be found in the care plan section)   Acute Rehab OT Goals Patient Stated Goal: home OT Goal Formulation: With patient Time For Goal Achievement: 04/27/24 Potential to Achieve Goals: Good   OT Frequency:  Min 2X/week    Co-evaluation              AM-PAC OT 6 Clicks Daily Activity     Outcome Measure Help from another person eating meals?: A Little Help from another person taking care of personal grooming?: A Little Help from another person toileting, which includes using toliet, bedpan, or urinal?: A Lot Help from another person bathing (including washing, rinsing, drying)?: A Lot Help from another person to put on and taking off regular upper body  clothing?: A Little Help from another person to put on and taking off regular lower body clothing?: A Lot 6 Click Score: 15   End of Session Equipment Utilized During Treatment: Rolling walker (2 wheels);Oxygen  (3-4L) Nurse Communication: Mobility status;Other (comment) (asking for lunch)  Activity Tolerance: Patient limited by fatigue;Patient limited by pain Patient left: in bed;with call bell/phone within reach;with bed alarm set  OT Visit Diagnosis: Other  abnormalities of gait and mobility (R26.89);Muscle weakness (generalized) (M62.81);Pain;History of falling (Z91.81) Pain - part of body:  (neck)                Time: 8785-8765 OT Time Calculation (min): 20 min Charges:  OT General Charges $OT Visit: 1 Visit OT Evaluation $OT Eval Moderate Complexity: 1 Mod  Etta NOVAK, OT Acute Rehabilitation Services Office [phone] Secure Chat Preferred    Etta GORMAN Hope 04/13/2024, 1:45 PM

## 2024-04-13 NOTE — Progress Notes (Signed)
 Progress Note   Patient: [name] FMW:[id number] DOB: Jul 29, 1956 DOA: 04/12/2024  DOS: the patient was seen and examined on 04/13/2024   Brief hospital course:  68 year old man brought to the ED via concern for unintentional tizanidine  overdose with complaints of left lower quadrant abdominal pain found to have diverticulitis and low blood pressure convert septic shock related to diverticulitis versus polypharmacy.   Assessment and Plan:  Concern for septic shock - Hypotensive on presentation requiring aggressive IV fluid bolus hydration and norepinephrine  initially.  CT noting underlying diverticulitis.  Hypotension shortly resolved after presentation to ICU.  No longer on vasopressor medications.  Blood pressure stable.  Continue to monitor blood pressure and blood cultures.  Hypovolemia - Likely underlying contributing etiology to patient's hypotension on presentation.  Received IV fluid hydration given.  Will slowly restart patient's home antihypertensive regimen as able to tolerate.  Possible unintentional tizanidine  overdose - Also possibly contributing to above.  Held on presentation.  Appears to be showing improvement.  Acute diverticulitis of descending: - Stranding and inflammation noted on CT at presentation.  No leukocytosis but noted abdominal pain.  Zosyn on board.  Will advance diet as tolerated.  chronic HFrEF - Hypovolemic on presentation.  Does not appear to be in acute exacerbation.  Was given aggressive IV fluid hydration.  Will restart patient's home torsemide  dose 80 mg twice daily.  Monitor blood pressure and urine output closely.  COPD - Nebulizers on board.  Does not appear to be in acute exacerbation.  No wheezing appreciated on presentation.  Paroxysmal atrial fibrillation - Resuming Eliquis .  Will restart home metoprolol .  Physical debilitation muscle weakness - Will order PT/OT.  Goals of care - Patient with previous home health for PT/OT.  Will  have PT OT evaluate today.  Possible DC in 1 to 2 days.  Subjective: Patient resting comfortably in bed this morning.  Has various complaints of pains around his body but otherwise feels improved from presentation.  Blood pressure stable since yesterday.,  No vasopressor medications on board.  Currently denies any fever, worsening shortness of breath, purulent sputum, nausea, vomiting, abdominal pain.  Motivated being discharged if possible.  Physical Exam:  Vitals:   04/13/24 0500 04/13/24 0600 04/13/24 0700 04/13/24 0804  BP:  (!) 109/91    Pulse: 70 69    Resp: 20 18 17    Temp:      TempSrc:      SpO2: 99% 96%  96%  Weight:        GENERAL:  Alert, pleasant, no acute distress, disheveled HEENT:  EOMI CARDIOVASCULAR:  RRR, no murmurs appreciated RESPIRATORY:  Clear to auscultation, no wheezing, rales, or rhonchi GASTROINTESTINAL:  Soft, nontender, nondistended EXTREMITIES:  No LE edema bilaterally NEURO:  No new focal deficits appreciated SKIN:  No rashes noted PSYCH:  Appropriate mood and affect     Data Reviewed:  No new imaging to review  Previous records (including but not limited to H&P, progress notes, nursing notes, TOC management) were reviewed in assessment of this patient.  Labs: CBC: Recent Labs  Lab 04/12/24 0414 04/13/24 0448  WBC 8.6 9.0  NEUTROABS 6.0  --   HGB 10.3* 11.4*  HCT 34.2* 37.8*  MCV 94.7 96.2  PLT 296 310   Basic Metabolic Panel: Recent Labs  Lab 04/12/24 0414 04/13/24 0448  NA 134* 133*  K 4.1 4.5  CL 96* 96*  CO2 29 29  GLUCOSE 109* 92  BUN 23 17  CREATININE 1.27* 1.05  CALCIUM  9.4 9.4  MG  --  2.3  PHOS  --  4.1   Liver Function Tests: Recent Labs  Lab 04/12/24 0414  AST 67*  ALT 74*  ALKPHOS 78  BILITOT 0.9  PROT 5.7*  ALBUMIN  2.8*   CBG: Recent Labs  Lab 04/12/24 0951 04/12/24 1158 04/12/24 1551 04/12/24 2100 04/13/24 0611  GLUCAP 83 95 80 95 77    Scheduled Meds:  apixaban   5 mg Oral BID    arformoterol   15 mcg Nebulization BID   budesonide  (PULMICORT ) nebulizer solution  0.25 mg Nebulization BID   Chlorhexidine  Gluconate Cloth  6 each Topical Daily   doxepin   20 mg Oral QHS   gabapentin   100 mg Oral QHS   insulin  aspart  0-9 Units Subcutaneous TID WC   lidocaine   1 patch Transdermal Q24H   mupirocin  ointment   Nasal BID   pantoprazole   40 mg Oral BID   revefenacin   175 mcg Nebulization Daily   torsemide   80 mg Oral BID   Continuous Infusions:  piperacillin-tazobactam (ZOSYN)  IV 3.375 g (04/13/24 0520)   PRN Meds:.acetaminophen , calcium  carbonate, docusate sodium , ipratropium-albuterol , morphine  injection, ondansetron  (ZOFRAN ) IV, polyethylene glycol, traMADol   Family Communication: None at bedside  Disposition: Status is: Inpatient Remains inpatient appropriate because: Diverticulitis , septic shock     Time spent: 50 minutes  Length of inpatient stay: 1 days  Author: Carliss LELON Canales, DO 04/13/2024 10:55 AM  For on call review [email or web address].

## 2024-04-13 NOTE — TOC Initial Note (Signed)
 Transition of Care [facility]) - Initial/Assessment Note    Patient Details  Name: [name] MRN: [id number] Date of Birth: 1956-06-26  [facility] CM/SW Contact:    Sudie Erminio Deems, RN Phone Number: 04/13/2024, 4:28 PM  Clinical Narrative:  Patient presented for LLQ pain. PTA patient was from home with his girlfriend Devere. Per son Medford Devere is getting older; she cannot physically and mentally care for the patient. PT/OT has made updated recommendations-now for SNF. Patient does not own a home of his own. Case Manager also spoke with daughter Melodie and she states patient has DME: cane, rolling walker and oxygen . Son Medford to get the agency name for oxygen . Patient was currently active for RN/PT with [facility]. Patient was recently at [facility] a month ago. Family is stating that they cannot assist the patient in either of there homes at this time. Patient would benefit from [facility] for long term assistance. Son to explore if patient has Medicaid if no; FC screen will need to be completed to see if the patient is eligible. CSW to speak with patient in am regarding SNF. Case Manager will continue to follow for additional needs.                   Per son Medford, patient is active with [employer] for Oxygen  needs.    Expected Discharge Plan: Skilled Nursing Facility Barriers to Discharge: Continued Medical Work up   Expected Discharge Plan and Services In-house Referral: Clinical Social Work Discharge Planning Services: CM Consult Post Acute Care Choice: Skilled Nursing Facility Living arrangements for the past 2 months: Single Family Home    Prior Living Arrangements/Services Living arrangements for the past 2 months: Single Family Home Lives with:: Significant Other (Per Devere, patient will not be able to return.) Patient language and need for interpreter reviewed:: Yes Do you feel safe going back to the place where you live?: Yes      Need for Family  Participation in Patient Care: Yes (Comment) Care giver support system in place?: No (comment) Current home services: DME (cane, rolling walker, and oxygen ) Criminal Activity/Legal Involvement Pertinent to Current Situation/Hospitalization: No - Comment as needed  Permission Sought/Granted Permission sought to share information with : Family Supports, Case Manager   Emotional Assessment Appearance:: Appears stated age       Alcohol / Substance Use: Not Applicable Psych Involvement: No (comment)  Admission diagnosis:  Shock (HCC) [R57.9] Hypotension due to drugs [I95.2] Accidental overdose, initial encounter [T50.901A] Left ureteral stone [N20.1] Patient Active Problem List   Diagnosis Date Noted   Accidental overdose 04/13/2024   Hypovolemic shock (HCC) 04/12/2024   Constipation 03/20/2024   Cellulitis of left lower extremity 02/10/2024   Hypotension 02/07/2024   Septic shock (HCC) 01/24/2024   Dysphagia 01/03/2024   Incontinence of feces 01/03/2024   Cervical dystonia 08/14/2023   C3 cervical fracture (HCC) 07/01/2023   Neck pain 06/13/2023   Leg wound, left 05/25/2023   Closed C7 fracture without spinal cord injury (HCC) 05/14/2023   Shingles 03/03/2023   Epigastric pain 01/16/2023   Nausea and vomiting 01/16/2023   Cardiac arrhythmia 01/16/2023   Cellulitis and abscess of leg, except foot 11/21/2022   Cellulitis of right lower extremity without foot 11/20/2022   Anxiety and depression 05/20/2022   AC (acromioclavicular) arthritis 04/24/2022   Cataract, nuclear sclerotic, left eye 03/07/2022   ED (erectile dysfunction) 01/03/2022   Intertrigo 12/24/2021   Gluteal tendinitis of left buttock 12/19/2021  Unilateral primary osteoarthritis, left knee 10/17/2021   Status post total knee replacement, left 10/17/2021   Rib pain on right side 10/10/2021   History of colonic polyps 09/26/2021   Bursitis of left hip 06/13/2021   Tooth fracture 06/11/2021   Pain and  swelling of right lower leg 05/18/2021   Gastrointestinal hemorrhage    Diverticulitis of descending colon    Preop exam for internal medicine 03/04/2021   B12 deficiency 03/04/2021   Vitamin D  deficiency 03/01/2021   Myalgia 12/01/2020   Laceration of left leg 09/17/2020   Statin myopathy 08/02/2020   Rotator cuff arthropathy of both shoulders 02/23/2020   ICD (implantable cardioverter-defibrillator) in place 12/27/2019   Elevated PSA 09/23/2019   Lip lesion 12/23/2018   Diabetic ulcer of left lower leg with necrosis of muscle (HCC) 12/05/2018   Chronic bursitis of left shoulder 07/01/2018   Pain due to total knee replacement (HCC) 03/31/2018   Rotator cuff arthropathy, left 02/13/2018   Right rotator cuff tear arthropathy 01/14/2018   Increased prostate specific antigen (PSA) velocity 09/26/2017   HFrEF (heart failure with reduced ejection fraction) (HCC) 09/18/2017   Trigger point of thoracic region 07/04/2017   Right lumbar radiculopathy 05/15/2017   Right leg swelling 05/09/2017   Glaucoma 04/18/2017   History of nasal polyposis 02/19/2017   Chronic rhinitis 02/19/2017   Chronic HFrEF (heart failure with reduced ejection fraction) (HCC)    Microhematuria 12/04/2016   Hyperlipidemia 08/15/2016   Encounter for well adult exam with abnormal findings 11/29/2015   Syncope 03/30/2015   Former tobacco use 02/25/2015   Type 2 diabetes mellitus (HCC) 02/25/2015   Permanent atrial fibrillation (HCC) 02/25/2015   Anemia 02/23/2015   Thyrotoxicosis 02/23/2015   Hx of adenomatous colonic polyps 10/21/2014   De Quervain's tenosynovitis, right 04/30/2014   Osteoarthritis of right knee 02/19/2014   Encounter for therapeutic drug monitoring 11/17/2013   Thoracic degenerative disc disease 06/17/2013   Thoracic spondylosis without myelopathy 02/11/2013   COPD (chronic obstructive pulmonary disease) (HCC) 08/20/2012   Bundle branch block, right and extreme left anterior fascicular  05/05/2012   Nonischemic cardiomyopathy (HCC) 05/02/2012   Cardiomyopathy (HCC)    Spongiotic dermatitis 09/26/2011   Urethral stricture 12/26/2010   Chronic pain syndrome 05/18/2010   OSA (obstructive sleep apnea) 10/21/2007   NEPHROLITHIASIS, HX OF 10/21/2007   GERD (gastroesophageal reflux disease) 06/09/2007   Benign prostatic hyperplasia 06/09/2007   Morbid obesity (HCC) 06/03/2007   Depression 06/03/2007   INSOMNIA 06/03/2007   PCP:  Norleen Lynwood ORN, MD Pharmacy:   [id number] - [place], [place] - [address] [place] [address] Phone: [phone] Fax: [phone]  [facility] [address] [place] [address] Phone: [phone] Fax: [phone]  Social Drivers of Health (SDOH) Social History: SDOH Screenings   Food Insecurity: No Food Insecurity (02/09/2024)  Housing: High Risk (02/09/2024)  Transportation Needs: No Transportation Needs (02/09/2024)  Utilities: Not At Risk (02/09/2024)  Alcohol Screen: Low Risk  (09/16/2023)  Depression (PHQ2-9): Low Risk  (01/28/2024)  Financial Resource Strain: Medium Risk (01/01/2024)  Physical Activity: Inactive (01/01/2024)  Social Connections: Socially Isolated (02/09/2024)  Stress: No Stress Concern Present (01/01/2024)  Tobacco Use: Medium Risk (04/12/2024)     Readmission Risk Interventions    02/07/2024    2:21 PM 11/22/2022    1:34 PM  Readmission Risk Prevention Plan  Post Dischage Appt  Complete  Medication Screening  Complete  Transportation Screening Complete Complete  PCP or Specialist Appt within 3-5 Days Complete   HRI or Home Care Consult Complete   Social Work Consult for Recovery Care Planning/Counseling Complete   Palliative Care Screening Not Applicable   Medication Review Oceanographer) Referral to Pharmacy

## 2024-04-13 NOTE — Hospital Course (Signed)
 68 year old man brought to the ED via concern for unintentional tizanidine  overdose with complaints of left lower quadrant abdominal pain found to have diverticulitis and low blood pressure convert septic shock related to diverticulitis versus polypharmacy.    Assessment and Plan:   Concern for septic shock - Hypotensive on presentation requiring aggressive IV fluid bolus hydration and norepinephrine  initially.  CT noting underlying diverticulitis.  Hypotension shortly resolved after presentation to ICU.  No longer on vasopressor medications.  Blood pressure stable.  Continue to monitor blood pressure and blood cultures.   Hypovolemia - Likely underlying contributing etiology to patient's hypotension on presentation.  Received IV fluid hydration given.  Will slowly restart patient's home antihypertensive regimen as able to tolerate.   Possible unintentional tizanidine  overdose - Also possibly contributing to above.  Held on presentation.  Appears to be showing improvement.   Acute diverticulitis of descending: - Stranding and inflammation noted on CT at presentation.  No leukocytosis but noted abdominal pain.  Zosyn on board.  Will advance diet as tolerated.   chronic HFrEF - Hypovolemic on presentation.  Does not appear to be in acute exacerbation.  Was given aggressive IV fluid hydration.  Will restart patient's home torsemide  dose 80 mg twice daily.  Monitor blood pressure and urine output closely.   COPD - Nebulizers on board.  Does not appear to be in acute exacerbation.  No wheezing appreciated on presentation.   Paroxysmal atrial fibrillation - Resuming Eliquis .  Will restart home metoprolol .   Physical debilitation muscle weakness - Will order PT/OT.   Goals of care - Patient with previous home health for PT/OT.  Will have PT OT evaluate today.  Possible DC in 1 to 2 days.

## 2024-04-13 NOTE — Plan of Care (Signed)

## 2024-04-13 NOTE — Evaluation (Addendum)
 Physical Therapy Evaluation Patient Details Name: [name] MRN: [id number] DOB: 17-Apr-1956 Today's Date: 04/13/2024  History of Present Illness  68 year old man brought to the ED via concern for unintentional tizanidine  overdose with complaints of left lower quadrant abdominal pain found to have diverticulitis and low blood pressure convert septic shock related to diverticulitis versus polypharmacy. PMH: morbid obesity, OSA, chronic pain syndrome, GERD, fibromyalgia, COPD   Clinical Impression  Pt admitted with above diagnosis. Mod I PTA but states girlfriend will assist with ADLs on occasion. RW used for mobility with hx of falls. He started HHPT recently. Required min assist for bed mobility, sit to stand transitions, and CGA for step pivot transfer. States he feels too tired to walk today. Hopeful to d/c home soon. SpO2 dropped to 87% on 2L HFNC after transfer, required 3L increase to bring into 90s.  RN notified. See vitals tab for orthostatics. Pt currently with functional limitations due to the deficits listed below (see PT Problem List). Pt will benefit from acute skilled PT to increase their independence and safety with mobility to allow discharge.      Addendum 1600: Received word from Dale - RN, that he spoke with daughter and she is unable to assist patient at d/c. Patient previously informed me that his girlfriend would be able to assist 24/7 at d/c. Due to patient currently requiring min assist with bed mobility and transfer, along with inability to walk today secondary to weakness, would consider short term rehab to help regain strength and function before returning home.  Will follow acutely.    If plan is discharge home, recommend the following: A little help with walking and/or transfers;A little help with bathing/dressing/bathroom;Assistance with cooking/housework;Assist for transportation;Help with stairs or ramp for entrance   Can travel by private vehicle         Equipment Recommendations None recommended by PT  Recommendations for Other Services       Functional Status Assessment Patient has had a recent decline in their functional status and demonstrates the ability to make significant improvements in function in a reasonable and predictable amount of time.     Precautions / Restrictions Precautions Precautions: Fall Recall of Precautions/Restrictions: Impaired Restrictions Weight Bearing Restrictions Per Provider Order: No      Mobility  Bed Mobility Overal bed mobility: Needs Assistance Bed Mobility: Supine to Sit     Supine to sit: Min assist     General bed mobility comments: Cues for technique, min assist to pull through therapist's hand and a little assist to scoot to EOB squarely.    Transfers Overall transfer level: Needs assistance Equipment used: Rolling walker (2 wheels) Transfers: Sit to/from Stand, Bed to chair/wheelchair/BSC Sit to Stand: Min assist   Step pivot transfers: Contact guard assist       General transfer comment: Min assist for boost to stand. Performed x2 from edge of bed. Rocks three times for momentum prior to standing. Cues for technique. CGA for step pivot tranfer to recliner. Easily fatigued, declined to attempt walking.    Ambulation/Gait               General Gait Details: declined, too fatigued after standing.  Stairs            Wheelchair Mobility     Tilt Bed    Modified Rankin (Stroke Patients Only)       Balance Overall balance assessment: Needs assistance Sitting-balance support: Feet supported, No upper extremity supported Sitting balance-Leahy Scale: Fair  Standing balance support: Bilateral upper extremity supported Standing balance-Leahy Scale: Poor                               Pertinent Vitals/Pain Pain Assessment Pain Assessment: Faces Faces Pain Scale: Hurts little more Pain Location: neck Pain Descriptors / Indicators:  Aching Pain Intervention(s): Monitored during session, Repositioned    Home Living Family/patient expects to be discharged to:: Private residence Living Arrangements: Spouse/significant other Available Help at Discharge: Family;Available 24 hours/day Type of Home: House Home Access: Stairs to enter Entrance Stairs-Rails: Right Entrance Stairs-Number of Steps: 1   Home Layout: One level Home Equipment: Pharmacist, hospital (2 wheels);Cane - single point;Grab bars - tub/shower;Hand held shower head;Wheelchair - manual Additional Comments: Pt reports he typically sleeps in his recliner.    Prior Function Prior Level of Function : Needs assist             Mobility Comments: RW to ambulate. Hx of falls ADLs Comments: Reports girlfriend occasionally assisting with ADLs but can typically do them himself.     Extremity/Trunk Assessment   Upper Extremity Assessment Upper Extremity Assessment: Defer to OT evaluation    Lower Extremity Assessment Lower Extremity Assessment: Generalized weakness (LEs with several areas of mepilex)       Communication   Communication Communication: No apparent difficulties    Cognition Arousal: Alert Behavior During Therapy: WFL for tasks assessed/performed   PT - Cognitive impairments: No family/caregiver present to determine baseline, Safety/Judgement, Awareness                       PT - Cognition Comments: Initially states he will just see if he can move once he gets up. Rather than work with therapy. Following commands: Impaired Following commands impaired: Follows multi-step commands with increased time, Follows multi-step commands inconsistently     Cueing Cueing Techniques: Verbal cues, Gestural cues     General Comments General comments (skin integrity, edema, etc.): See orthostatics: after transfer to recliner BP 118/72 HR 71: sats droped to 87% on 2L HFNC - required 3L to increase into 90s. RN notified.     Exercises     Assessment/Plan    PT Assessment Patient needs continued PT services  PT Problem List Decreased strength;Decreased range of motion;Decreased activity tolerance;Decreased balance;Decreased mobility;Decreased safety awareness;Decreased knowledge of precautions;Cardiopulmonary status limiting activity;Obesity;Pain       PT Treatment Interventions DME instruction;Gait training;Stair training;Functional mobility training;Therapeutic activities;Therapeutic exercise;Balance training;Neuromuscular re-education;Patient/family education;Cognitive remediation    PT Goals (Current goals can be found in the Care Plan section)  Acute Rehab PT Goals Patient Stated Goal: Go home PT Goal Formulation: With patient Time For Goal Achievement: 04/27/24 Potential to Achieve Goals: Good    Frequency Min 2X/week     Co-evaluation               AM-PAC PT 6 Clicks Mobility  Outcome Measure Help needed turning from your back to your side while in a flat bed without using bedrails?: A Little Help needed moving from lying on your back to sitting on the side of a flat bed without using bedrails?: A Little Help needed moving to and from a bed to a chair (including a wheelchair)?: A Little Help needed standing up from a chair using your arms (e.g., wheelchair or bedside chair)?: A Little Help needed to walk in hospital room?: A Little Help needed climbing 3-5 steps with  a railing? : A Lot 6 Click Score: 17    End of Session Equipment Utilized During Treatment: Gait belt;Oxygen  Activity Tolerance: Patient limited by fatigue Patient left: in chair;with call bell/phone within reach Nurse Communication: Mobility status PT Visit Diagnosis: Unsteadiness on feet (R26.81);Other abnormalities of gait and mobility (R26.89);Muscle weakness (generalized) (M62.81);History of falling (Z91.81)    Time: 8986-8957 PT Time Calculation (min) (ACUTE ONLY): 29 min   Charges:   PT Evaluation $PT  Eval Low Complexity: 1 Low PT Treatments $Therapeutic Activity: 8-22 mins PT General Charges $$ ACUTE PT VISIT: 1 Visit         Leontine Roads, PT, DPT [facility]  Rehabilitation Services Physical Therapist Office: [phone] Website: [email or web address]   Leontine GORMAN Roads 04/13/2024, 1:28 PM

## 2024-04-14 ENCOUNTER — Encounter (HOSPITAL_COMMUNITY)

## 2024-04-14 ENCOUNTER — Encounter (HOSPITAL_COMMUNITY): Payer: Self-pay

## 2024-04-14 ENCOUNTER — Other Ambulatory Visit (HOSPITAL_COMMUNITY)

## 2024-04-14 ENCOUNTER — Ambulatory Visit (HOSPITAL_COMMUNITY)

## 2024-04-14 DIAGNOSIS — I5022 Chronic systolic (congestive) heart failure: Secondary | ICD-10-CM | POA: Diagnosis not present

## 2024-04-14 DIAGNOSIS — K5732 Diverticulitis of large intestine without perforation or abscess without bleeding: Secondary | ICD-10-CM | POA: Diagnosis not present

## 2024-04-14 DIAGNOSIS — T50901A Poisoning by unspecified drugs, medicaments and biological substances, accidental (unintentional), initial encounter: Secondary | ICD-10-CM | POA: Diagnosis not present

## 2024-04-14 DIAGNOSIS — A419 Sepsis, unspecified organism: Secondary | ICD-10-CM | POA: Diagnosis not present

## 2024-04-14 DIAGNOSIS — N200 Calculus of kidney: Secondary | ICD-10-CM

## 2024-04-14 LAB — URINE CULTURE: Culture: 10000 — AB

## 2024-04-14 LAB — COMPREHENSIVE METABOLIC PANEL WITH GFR
ALT: 49 U/L — ABNORMAL HIGH (ref 0–44)
AST: 30 U/L (ref 15–41)
Albumin: 2.9 g/dL — ABNORMAL LOW (ref 3.5–5.0)
Alkaline Phosphatase: 87 U/L (ref 38–126)
Anion gap: 12 (ref 5–15)
BUN: 15 mg/dL (ref 8–23)
CO2: 33 mmol/L — ABNORMAL HIGH (ref 22–32)
Calcium: 9.2 mg/dL (ref 8.9–10.3)
Chloride: 92 mmol/L — ABNORMAL LOW (ref 98–111)
Creatinine, Ser: 1.03 mg/dL (ref 0.61–1.24)
GFR, Estimated: >60 mL/min (ref >60)
Glucose, Bld: 84 mg/dL (ref 70–99)
Potassium: 3.7 mmol/L (ref 3.5–5.1)
Sodium: 137 mmol/L (ref 135–145)
Total Bilirubin: 1.1 mg/dL (ref 0.0–1.2)
Total Protein: 6.7 g/dL (ref 6.5–8.1)

## 2024-04-14 LAB — CBC
HCT: 37.5 % — ABNORMAL LOW (ref 39.0–52.0)
Hemoglobin: 11.4 g/dL — ABNORMAL LOW (ref 13.0–17.0)
MCH: 29 pg (ref 26.0–34.0)
MCHC: 30.4 g/dL (ref 30.0–36.0)
MCV: 95.4 fL (ref 80.0–100.0)
Platelets: 362 10*3/uL (ref 150–400)
RBC: 3.93 MIL/uL — ABNORMAL LOW (ref 4.22–5.81)
RDW: 15.4 % (ref 11.5–15.5)
WBC: 9.9 10*3/uL (ref 4.0–10.5)
nRBC: 0 % (ref 0.0–0.2)

## 2024-04-14 LAB — GLUCOSE, CAPILLARY
Glucose-Capillary: 87 mg/dL (ref 70–99)
Glucose-Capillary: 79 mg/dL (ref 70–99)
Glucose-Capillary: 109 mg/dL — ABNORMAL HIGH (ref 70–99)
Glucose-Capillary: 109 mg/dL — ABNORMAL HIGH (ref 70–99)

## 2024-04-14 LAB — MAGNESIUM: Magnesium: 1.8 mg/dL (ref 1.7–2.4)

## 2024-04-14 MED ORDER — FOSFOMYCIN TROMETHAMINE 3 G PO PACK
3.0000 g | PACK | Freq: Once | ORAL | Status: AC
  Administered 2024-04-14: 3 g via ORAL
  Filled 2024-04-14: qty 3

## 2024-04-14 MED ORDER — SODIUM CHLORIDE 0.9 % IV SOLN
12.5000 mg | Freq: Once | INTRAVENOUS | Status: AC
  Administered 2024-04-14: 12.5 mg via INTRAVENOUS
  Filled 2024-04-14: qty 12.5

## 2024-04-14 MED ORDER — TAMSULOSIN HCL 0.4 MG PO CAPS
0.4000 mg | ORAL_CAPSULE | Freq: Every day | ORAL | Status: DC
  Administered 2024-04-14 – 2024-04-17 (×4): 0.4 mg via ORAL
  Filled 2024-04-14 (×4): qty 1

## 2024-04-14 MED ORDER — KETOROLAC TROMETHAMINE 15 MG/ML IJ SOLN
15.0000 mg | Freq: Three times a day (TID) | INTRAMUSCULAR | Status: DC
  Administered 2024-04-14 – 2024-04-17 (×9): 15 mg via INTRAVENOUS
  Filled 2024-04-14 (×9): qty 1

## 2024-04-14 NOTE — Progress Notes (Signed)
 Progress Note   Patient: [name] FMW:[id number] DOB: 02-04-1956 DOA: 04/12/2024  DOS: the patient was seen and examined on 04/14/2024   Brief hospital course:  68 year old man brought to the ED via concern for unintentional tizanidine  overdose with complaints of left lower quadrant abdominal pain found to have diverticulitis and low blood pressure convert septic shock related to diverticulitis versus polypharmacy.    Assessment and Plan:   Concern for septic shock - Hypotensive on presentation requiring aggressive IV fluid bolus hydration and norepinephrine  initially.  CT noting underlying diverticulitis.  Hypotension shortly resolved after presentation to ICU.  No longer on vasopressor medications.  Blood pressure stable.  Continue to monitor blood pressure and blood cultures.   Hypovolemia - Likely underlying contributing etiology to patient's hypotension on presentation.  Received IV fluid hydration given.  Will slowly restart patient's home antihypertensive regimen as able to tolerate.   Possible unintentional tizanidine  overdose - Also possibly contributing to above.  Held on presentation.  Appears to be showing improvement.   Acute diverticulitis of descending: - Stranding and inflammation noted on CT at presentation.  No leukocytosis but noted abdominal pain.  Zosyn on board.  Will advance diet as tolerated.  Left nephrolithiasis (POA) - Likely contributing to patient's pain.  No hydroureter or hydronephrosis appreciated.  Tamsulosin  on board.  Will give Toradol 15 mg IV every 8 hours x 5.   chronic HFrEF - Hypovolemic on presentation.  Does not appear to be in acute exacerbation.  Was given aggressive IV fluid hydration.  Restarted patient's home torsemide  dose 80 mg twice daily.  Showing good response.  Monitor blood pressure and urine output closely.   COPD - Nebulizers on board.  Does not appear to be in acute exacerbation.  No wheezing appreciated on presentation.    Paroxysmal atrial fibrillation - Resuming Eliquis , metoprolol .   Physical debilitation muscle weakness - PT OT on board.  Recommending STR.   Goals of care - Initial disposition plan to go home however patient is weak and debilitated.  Evaluation by PT/OT noting recommendations for STR.  Patient himself capitulating and resigning himself to STR.  Will work closely with patient, family, TOC on disposition planning.  Subjective: Patient feeling nauseous this morning, some episodes of emesis.  Still having diffuse somewhat chronic appearing pain throughout.  Denies any shortness of breath, chest pain.  Lower quadrant abdominal pain appears to be some mildly improved.  Patient was initially motivated to be discharged home however noting how weak he was is now motivated to pursue short-term rehab.  Physical Exam:  Vitals:   04/14/24 0003 04/14/24 0400 04/14/24 0437 04/14/24 0735  BP: 124/75  103/72 (!) 102/56  Pulse: 73 69 69 73  Resp: 16 17 16 17   Temp: 98.6 F (37 C)  98.1 F (36.7 C) 98.1 F (36.7 C)  TempSrc: Oral  Oral Oral  SpO2: 96% 95% 96% 91%  Weight:   107.1 kg   Height:        GENERAL:  Alert, pleasant, no acute distress, disheveled HEENT:  EOMI CARDIOVASCULAR:  RRR, no murmurs appreciated RESPIRATORY: Poor air movement bilaterally GASTROINTESTINAL:  Soft, nontender, nondistended EXTREMITIES:  No LE edema bilaterally NEURO:  No new focal deficits appreciated SKIN: Scattered ecchymosis PSYCH:  Appropriate mood and affect    Data Reviewed:  No new imaging  Previous records (including but not limited to H&P, progress notes, nursing notes, TOC management) were reviewed in assessment of this patient.  Labs: CBC: Recent Labs  Lab 04/12/24 0414 04/13/24 0448 04/14/24 0449  WBC 8.6 9.0 9.9  NEUTROABS 6.0  --   --   HGB 10.3* 11.4* 11.4*  HCT 34.2* 37.8* 37.5*  MCV 94.7 96.2 95.4  PLT 296 310 362   Basic Metabolic Panel: Recent Labs  Lab 04/12/24 0414  04/13/24 0448 04/14/24 0449  NA 134* 133* 137  K 4.1 4.5 3.7  CL 96* 96* 92*  CO2 29 29 33*  GLUCOSE 109* 92 84  BUN 23 17 15   CREATININE 1.27* 1.05 1.03  CALCIUM  9.4 9.4 9.2  MG  --  2.3 1.8  PHOS  --  4.1  --    Liver Function Tests: Recent Labs  Lab 04/12/24 0414 04/14/24 0449  AST 67* 30  ALT 74* 49*  ALKPHOS 78 87  BILITOT 0.9 1.1  PROT 5.7* 6.7  ALBUMIN  2.8* 2.9*   CBG: Recent Labs  Lab 04/13/24 0611 04/13/24 1133 04/13/24 1613 04/13/24 2114 04/14/24 0734  GLUCAP 77 108* 81 77 87    Scheduled Meds:  apixaban   5 mg Oral BID   arformoterol   15 mcg Nebulization BID   budesonide  (PULMICORT ) nebulizer solution  0.25 mg Nebulization BID   doxepin   20 mg Oral QHS   fosfomycin  3 g Oral Once   gabapentin   100 mg Oral QHS   insulin  aspart  0-9 Units Subcutaneous TID WC   lidocaine   1 patch Transdermal Q24H   mupirocin  ointment   Nasal BID   pantoprazole   40 mg Oral BID   revefenacin   175 mcg Nebulization Daily   torsemide   80 mg Oral BID   Continuous Infusions:  piperacillin-tazobactam (ZOSYN)  IV 3.375 g (04/14/24 0518)   PRN Meds:.acetaminophen , calcium  carbonate, docusate sodium , ipratropium-albuterol , morphine  injection, ondansetron  (ZOFRAN ) IV, polyethylene glycol, traMADol   Family Communication: None at bedside  Disposition: Status is: Inpatient Remains inpatient appropriate because: Diverticulitis     Time spent: 48 minutes  Length of inpatient stay: 2 days  Author: Carliss LELON Canales, DO 04/14/2024 10:54 AM  For on call review [email or web address].

## 2024-04-14 NOTE — Telephone Encounter (Signed)
 Not sure what to say as this is unusual.  I will have to declines, and suspect if [employer] needs a Chronic disease form signed, they normally send us  the form to sign   thanks

## 2024-04-14 NOTE — Progress Notes (Signed)
 Dr. Arlon made aware that the patient refused his evening dose of Torsemide  because he feels dry.

## 2024-04-14 NOTE — TOC Progression Note (Addendum)
 Transition of Care [facility]) - Progression Note    Patient Details  Name: [name] MRN: [id number] Date of Birth: Jul 22, 1956  [facility] CM/SW Contact  [name], LCSWA Phone Number: 04/14/2024, 2:16 PM  Clinical Narrative:     CSW received consult for possible SNF placement at time of discharge. CSW spoke with patient at bedside regarding PT recommendation of SNF placement at time of discharge. Patient reports PTA he comes from home with girlfriend Devere.  Patient expressed understanding of PT recommendation and is agreeable to SNF placement at time of discharge. Patient gave CSW permission to fax out initial referral for SNF placement.Patient reports preference for [facility] . CSW discussed insurance authorization process. Patient gave CSW permission to reach out to financial counseling to screen him for medicaid.Patient gave CSW permission to update his daughter Melodie with his dc plans.No further questions reported at this time. CSW emailed [name] in financial counseling and requested to screen patient for medicaid.CSW to continue to follow and assist with discharge planning needs.   Update- CSW started insurance authorization for patient. Auth ID# [id number].  Update- CSW spoke with [place] with [facility] who confirmed unable to offer SNF bed for patient.    Expected Discharge Plan: Skilled Nursing Facility Barriers to Discharge: Continued Medical Work up  Expected Discharge Plan and Services In-house Referral: Clinical Social Work Discharge Planning Services: CM Consult Post Acute Care Choice: Skilled Nursing Facility Living arrangements for the past 2 months: Single Family Home                                       Social Determinants of Health (SDOH) Interventions SDOH Screenings   Food Insecurity: No Food Insecurity (04/13/2024)  Housing: Low Risk  (04/13/2024)  Recent Concern: Housing - High Risk (02/09/2024)  Transportation Needs: No  Transportation Needs (04/13/2024)  Utilities: Not At Risk (04/13/2024)  Alcohol Screen: Low Risk  (09/16/2023)  Depression (PHQ2-9): Low Risk  (01/28/2024)  Financial Resource Strain: Medium Risk (01/01/2024)  Physical Activity: Inactive (01/01/2024)  Social Connections: Socially Isolated (04/13/2024)  Stress: No Stress Concern Present (01/01/2024)  Tobacco Use: Medium Risk (04/12/2024)    Readmission Risk Interventions    02/07/2024    2:21 PM 11/22/2022    1:34 PM  Readmission Risk Prevention Plan  Post Dischage Appt  Complete  Medication Screening  Complete  Transportation Screening Complete Complete  PCP or Specialist Appt within 3-5 Days Complete   HRI or Home Care Consult Complete   Social Work Consult for Recovery Care Planning/Counseling Complete   Palliative Care Screening Not Applicable   Medication Review Oceanographer) Referral to Pharmacy

## 2024-04-14 NOTE — [place] FL2 (Signed)
 [place]  MEDICAID FL2 LEVEL OF CARE FORM     IDENTIFICATION  Patient Name: [name] Birthdate: [birth year] Sex: male Admission Date (Current Location): 04/12/2024  [facility] and [place] Number:  Producer, television/film/video and Address:  [facility], [address] [place], [place] [address]      Provider Number: [id number]  Attending Physician Name and Address:  Arlon Carliss ORN, DO  Relative Name and Phone Number:  Bennetta Grandchild (daughter) [phone]    Current Level of Care: Hospital Recommended Level of Care: Skilled Nursing Facility Prior Approval Number:    Date Approved/Denied:   PASRR Number: [id number]  Discharge Plan: SNF    Current Diagnoses: Patient Active Problem List   Diagnosis Date Noted   Accidental overdose 04/13/2024   Hypovolemic shock (HCC) 04/12/2024   Constipation 03/20/2024   Cellulitis of left lower extremity 02/10/2024   Hypotension 02/07/2024   Septic shock (HCC) 01/24/2024   Dysphagia 01/03/2024   Incontinence of feces 01/03/2024   Cervical dystonia 08/14/2023   C3 cervical fracture (HCC) 07/01/2023   Neck pain 06/13/2023   Leg wound, left 05/25/2023   Closed C7 fracture without spinal cord injury (HCC) 05/14/2023   Shingles 03/03/2023   Epigastric pain 01/16/2023   Nausea and vomiting 01/16/2023   Cardiac arrhythmia 01/16/2023   Cellulitis and abscess of leg, except foot 11/21/2022   Cellulitis of right lower extremity without foot 11/20/2022   Anxiety and depression 05/20/2022   AC (acromioclavicular) arthritis 04/24/2022   Cataract, nuclear sclerotic, left eye 03/07/2022   ED (erectile dysfunction) 01/03/2022   Intertrigo 12/24/2021   Gluteal tendinitis of left buttock 12/19/2021   Unilateral primary osteoarthritis, left knee 10/17/2021   Status post total knee replacement, left 10/17/2021   Rib pain on right side 10/10/2021   History of colonic polyps 09/26/2021   Bursitis of left hip 06/13/2021    Tooth fracture 06/11/2021   Pain and swelling of right lower leg 05/18/2021   Gastrointestinal hemorrhage    Diverticulitis of descending colon    Preop exam for internal medicine 03/04/2021   B12 deficiency 03/04/2021   Vitamin D  deficiency 03/01/2021   Myalgia 12/01/2020   Laceration of left leg 09/17/2020   Statin myopathy 08/02/2020   Rotator cuff arthropathy of both shoulders 02/23/2020   ICD (implantable cardioverter-defibrillator) in place 12/27/2019   Elevated PSA 09/23/2019   Lip lesion 12/23/2018   Diabetic ulcer of left lower leg with necrosis of muscle (HCC) 12/05/2018   Chronic bursitis of left shoulder 07/01/2018   Pain due to total knee replacement (HCC) 03/31/2018   Rotator cuff arthropathy, left 02/13/2018   Right rotator cuff tear arthropathy 01/14/2018   Increased prostate specific antigen (PSA) velocity 09/26/2017   HFrEF (heart failure with reduced ejection fraction) (HCC) 09/18/2017   Trigger point of thoracic region 07/04/2017   Right lumbar radiculopathy 05/15/2017   Right leg swelling 05/09/2017   Glaucoma 04/18/2017   History of nasal polyposis 02/19/2017   Chronic rhinitis 02/19/2017   Chronic HFrEF (heart failure with reduced ejection fraction) (HCC)    Microhematuria 12/04/2016   Hyperlipidemia 08/15/2016   Encounter for well adult exam with abnormal findings 11/29/2015   Syncope 03/30/2015   Former tobacco use 02/25/2015   Type 2 diabetes mellitus (HCC) 02/25/2015   Permanent atrial fibrillation (HCC) 02/25/2015   Anemia 02/23/2015   Thyrotoxicosis 02/23/2015   Hx of adenomatous colonic polyps 10/21/2014   De Quervain's tenosynovitis, right 04/30/2014   Osteoarthritis of  right knee 02/19/2014   Encounter for therapeutic drug monitoring 11/17/2013   Thoracic degenerative disc disease 06/17/2013   Thoracic spondylosis without myelopathy 02/11/2013   COPD (chronic obstructive pulmonary disease) (HCC) 08/20/2012   Bundle branch block, right and  extreme left anterior fascicular 05/05/2012   Nonischemic cardiomyopathy (HCC) 05/02/2012   Cardiomyopathy (HCC)    Spongiotic dermatitis 09/26/2011   Urethral stricture 12/26/2010   Chronic pain syndrome 05/18/2010   OSA (obstructive sleep apnea) 10/21/2007   NEPHROLITHIASIS, HX OF 10/21/2007   GERD (gastroesophageal reflux disease) 06/09/2007   Benign prostatic hyperplasia 06/09/2007   Morbid obesity (HCC) 06/03/2007   Depression 06/03/2007   INSOMNIA 06/03/2007    Orientation RESPIRATION BLADDER Height & Weight     Self, Time, Situation, Place  O2 (Nasal Cannula 2 liters) Continent, External catheter (External Urinary Catheter) Weight: 236 lb 1.8 oz (107.1 kg) Height:  5' 9 (175.3 cm)  BEHAVIORAL SYMPTOMS/MOOD NEUROLOGICAL BOWEL NUTRITION STATUS      Continent Diet (Please see discharge summary)  AMBULATORY STATUS COMMUNICATION OF NEEDS Skin   Limited Assist Verbally Other (Comment) (Ecchymosis,arm,Bil.,Erythema,Arm,Leg,Bil.,Wound/Incision LDAs,Wound,vascular,ulcer,R,L,Lower,wound)                       Personal Care Assistance Level of Assistance  Bathing, Feeding, Dressing Bathing Assistance: Maximum assistance Feeding assistance: Limited assistance Dressing Assistance: Maximum assistance     Functional Limitations Info  Sight, Hearing, Speech Sight Info: Impaired (wears glasses) Hearing Info: Adequate Speech Info: Adequate    SPECIAL CARE FACTORS FREQUENCY  PT (By licensed PT), OT (By licensed OT)     PT Frequency: 5x min weekly OT Frequency: 5x min weekly            Contractures Contractures Info: Not present    Additional Factors Info  Code Status, Allergies, Psychotropic, Insulin  Sliding Scale Code Status Info: FULL Allergies Info: Amiodarone ,Statins,Wound Dressing Adhesive Psychotropic Info: gabapentin  (NEURONTIN ) capsule 100 mg daily at bedtime Insulin  Sliding Scale Info: insulin  aspart (novoLOG ) injection 0-9 Units 3 times daily with meals        Current Medications (04/14/2024):  This is the current hospital active medication list Current Facility-Administered Medications  Medication Dose Route Frequency Provider Last Rate Last Admin   acetaminophen  (TYLENOL ) tablet 650 mg  650 mg Oral Q6H PRN Hunsucker, Donnice SAUNDERS, MD   650 mg at 04/14/24 1024   apixaban  (ELIQUIS ) tablet 5 mg  5 mg Oral BID Hunsucker, Donnice SAUNDERS, MD   5 mg at 04/14/24 1022   arformoterol  (BROVANA ) nebulizer solution 15 mcg  15 mcg Nebulization BID Hunsucker, Donnice SAUNDERS, MD   15 mcg at 04/14/24 9150   budesonide  (PULMICORT ) nebulizer solution 0.25 mg  0.25 mg Nebulization BID Hunsucker, Donnice SAUNDERS, MD   0.25 mg at 04/14/24 9150   calcium  carbonate (TUMS - dosed in mg elemental calcium ) chewable tablet 200 mg of elemental calcium   1 tablet Oral BID PRN Paliwal, Aditya, MD       docusate sodium  (COLACE) capsule 100 mg  100 mg Oral BID PRN Hunsucker, Donnice SAUNDERS, MD       doxepin  (SINEQUAN ) capsule 20 mg  20 mg Oral QHS Hunsucker, Donnice SAUNDERS, MD   20 mg at 04/13/24 2104   gabapentin  (NEURONTIN ) capsule 100 mg  100 mg Oral QHS Hunsucker, Donnice SAUNDERS, MD   100 mg at 04/13/24 2103   insulin  aspart (novoLOG ) injection 0-9 Units  0-9 Units Subcutaneous TID WC Hunsucker, Donnice SAUNDERS, MD  ipratropium-albuterol  (DUONEB) 0.5-2.5 (3) MG/3ML nebulizer solution 3 mL  3 mL Nebulization Q6H PRN Hunsucker, Donnice SAUNDERS, MD   3 mL at 04/12/24 1143   ketorolac (TORADOL) 15 MG/ML injection 15 mg  15 mg Intravenous Q8H Calkins, Derek W, DO   15 mg at 04/14/24 1417   lidocaine  (LIDODERM ) 5 % 1 patch  1 patch Transdermal Q24H Hunsucker, Donnice SAUNDERS, MD   1 patch at 04/14/24 1417   morphine  (PF) 2 MG/ML injection 2 mg  2 mg Intravenous Q4H PRN Hunsucker, Donnice SAUNDERS, MD   2 mg at 04/13/24 1200   mupirocin  ointment (BACTROBAN ) 2 %   Nasal BID Hunsucker, Donnice SAUNDERS, MD   Given at 04/14/24 1022   ondansetron  (ZOFRAN ) injection 4 mg  4 mg Intravenous Q6H PRN Hunsucker, Donnice SAUNDERS, MD   4 mg at 04/14/24 0335    pantoprazole  (PROTONIX ) EC tablet 40 mg  40 mg Oral BID Hunsucker, Donnice SAUNDERS, MD   40 mg at 04/14/24 1022   piperacillin-tazobactam (ZOSYN) IVPB 3.375 g  3.375 g Intravenous Q8H Hunsucker, Donnice SAUNDERS, MD 12.5 mL/hr at 04/14/24 1419 3.375 g at 04/14/24 1419   polyethylene glycol (MIRALAX  / GLYCOLAX ) packet 17 g  17 g Oral Daily PRN Hunsucker, Donnice SAUNDERS, MD       revefenacin  (YUPELRI ) nebulizer solution 175 mcg  175 mcg Nebulization Daily Hunsucker, Donnice SAUNDERS, MD   175 mcg at 04/14/24 9150   tamsulosin  (FLOMAX ) capsule 0.4 mg  0.4 mg Oral Daily Arlon Carliss ORN, DO   0.4 mg at 04/14/24 1418   torsemide  (DEMADEX ) tablet 80 mg  80 mg Oral BID Arlon Carliss ORN, DO   80 mg at 04/14/24 1022   traMADol  (ULTRAM ) tablet 50 mg  50 mg Oral Q8H PRN Hunsucker, Donnice SAUNDERS, MD   50 mg at 04/14/24 1024     Discharge Medications: Please see discharge summary for a list of discharge medications.  Relevant Imaging Results:  Relevant Lab Results:   Additional Information SSN [id number]  [name], LCSWA

## 2024-04-14 NOTE — Plan of Care (Signed)

## 2024-04-15 DIAGNOSIS — R6521 Severe sepsis with septic shock: Secondary | ICD-10-CM

## 2024-04-15 DIAGNOSIS — A419 Sepsis, unspecified organism: Secondary | ICD-10-CM

## 2024-04-15 LAB — CBC
HCT: 37.2 % — ABNORMAL LOW (ref 39.0–52.0)
Hemoglobin: 11.2 g/dL — ABNORMAL LOW (ref 13.0–17.0)
MCH: 28.3 pg (ref 26.0–34.0)
MCHC: 30.1 g/dL (ref 30.0–36.0)
MCV: 93.9 fL (ref 80.0–100.0)
Platelets: 347 10*3/uL (ref 150–400)
RBC: 3.96 MIL/uL — ABNORMAL LOW (ref 4.22–5.81)
RDW: 15.2 % (ref 11.5–15.5)
WBC: 8.8 10*3/uL (ref 4.0–10.5)
nRBC: 0 % (ref 0.0–0.2)

## 2024-04-15 LAB — BASIC METABOLIC PANEL WITH GFR
Anion gap: 13 (ref 5–15)
BUN: 13 mg/dL (ref 8–23)
CO2: 31 mmol/L (ref 22–32)
Calcium: 8.7 mg/dL — ABNORMAL LOW (ref 8.9–10.3)
Chloride: 92 mmol/L — ABNORMAL LOW (ref 98–111)
Creatinine, Ser: 1.07 mg/dL (ref 0.61–1.24)
GFR, Estimated: >60 mL/min (ref >60)
Glucose, Bld: 101 mg/dL — ABNORMAL HIGH (ref 70–99)
Potassium: 3.5 mmol/L (ref 3.5–5.1)
Sodium: 136 mmol/L (ref 135–145)

## 2024-04-15 LAB — GLUCOSE, CAPILLARY
Glucose-Capillary: 113 mg/dL — ABNORMAL HIGH (ref 70–99)
Glucose-Capillary: 118 mg/dL — ABNORMAL HIGH (ref 70–99)
Glucose-Capillary: 101 mg/dL — ABNORMAL HIGH (ref 70–99)

## 2024-04-15 LAB — MAGNESIUM: Magnesium: 1.8 mg/dL (ref 1.7–2.4)

## 2024-04-15 MED ORDER — GLUCAGON HCL RDNA (DIAGNOSTIC) 1 MG IJ SOLR: 1.0000 mg | INTRAMUSCULAR | Status: DC | PRN

## 2024-04-15 MED ORDER — ARTIFICIAL TEARS OPHTHALMIC OINT: TOPICAL_OINTMENT | OPHTHALMIC | Status: DC | PRN

## 2024-04-15 MED ORDER — METOPROLOL TARTRATE 5 MG/5ML IV SOLN: 5.0000 mg | INTRAVENOUS | Status: DC | PRN

## 2024-04-15 MED ORDER — GUAIFENESIN 100 MG/5ML PO LIQD: 5.0000 mL | ORAL | Status: DC | PRN

## 2024-04-15 NOTE — Hospital Course (Addendum)
 Brief Narrative:   68 year old man with history of CHF with EF 40%, chronic constipation, depression, HTN, fibromyalgia, GERD, paroxysmal A-fib on Eliquis , peripheral neuropathy chronic renal stones brought to the ED via concern for unintentional tizanidine  overdose with complaints of left lower quadrant abdominal pain found to have diverticulitis and low blood pressure convert septic shock related to diverticulitis versus polypharmacy.  Eventually patient was transferred out of the ICU.  Urine cultures grew ESBL, 10k colony therefore received 1 dose of fosfomycin.  Currently slowly improving on Zosyn  for his diverticulitis, EOT 7/8.  Also received electrolyte repletion during hospitalization.   Eventually patient was placed at SNF.  Assessment & Plan:  Principal Problem:   Septic shock (HCC) Active Problems:   Chronic HFrEF (heart failure with reduced ejection fraction) (HCC)   Diverticulitis of descending colon   Hypovolemic shock (HCC)   Accidental overdose    Concern for septic shock - Diverticulitis versus urinary tract infection versus hypovolemia versus drug overdose.  All of this is now improving.  No longer on vasopressor medications.  Blood pressure stable.  Urine cultures growing ESBL and received 1 dose of fosfomycin in the hospital.  Hemodynamically now remained stable. -Now his losartan  and Lopressor  has been discontinued.  This can be readdressed outpatient   Hypovolemia -Proved with hydration  Hypokalemia - Repletion   Possible unintentional tizanidine  overdose -Unintentional, showing improvement.  Dose has been reduced   Acute diverticulitis of descending: -Continue Zosyn  > Augmentin , EOT 7/8 - Will need colonoscopy outpatient in 6-8 weeks  Atypical intermittent chest pain -Trop are neg, Nonischemic ekg  ESBL UTI Bilateral renal stones -Received a dose of fosfomycin, urine cultures growing 10K colonies.  We can add meropenem if spikes fevers -Flomax  -Eventually  needs outpatient follow-up with urology.  Follows with at [facility] urology   Left nephrolithiasis (POA) - Likely contributing to patient's pain.  No hydroureter or hydronephrosis appreciated.  Tamsulosin  on board. Toradol    chronic HFrEF -Initially hypovolemic but now appears to be well resuscitated.  Torsemide  80 mg twice daily started.  For now due to hypotension is Lopressor  and losartan  has been discontinued.  This can be readdressed outpatient   COPD -Bronchodilators   Paroxysmal atrial fibrillation - Resuming Eliquis , eventually consider resuming Toprol .  Currently heart rate in 70s   Physical debilitation muscle weakness - PT OT SNF  TOC consulted.   DVT prophylaxis: apixaban  (ELIQUIS ) tablet 5 mg     Code Status: Full Code Family Communication:   Status is: Inpatient Remains inpatient appropriate because: snf placement   Subjective: Feeling better does not have any complaints today.  Examination:  General exam: Appears calm and comfortable, generally very weak and frail appearing Respiratory system: Clear to auscultation. Respiratory effort normal. Cardiovascular system: S1 & S2 heard, RRR. No JVD, murmurs, rubs, gallops or clicks. No pedal edema. Gastrointestinal system: Abdomen is nondistended, soft and nontender. No organomegaly or masses felt. Normal bowel sounds heard. Central nervous system: Alert and oriented. No focal neurological deficits. Extremities: Symmetric4 x 5 power. Skin: No rashes, lesions or ulcers Psychiatry: Judgement and insight appear normal. Mood & affect appropriate.

## 2024-04-15 NOTE — Progress Notes (Signed)
 PROGRESS NOTE    [name]  FMW:[id number] DOB: 1956/01/24 DOA: 04/12/2024 PCP: Norleen Lynwood ORN, MD    Brief Narrative:   68 year old man brought to the ED via concern for unintentional tizanidine  overdose with complaints of left lower quadrant abdominal pain found to have diverticulitis and low blood pressure convert septic shock related to diverticulitis versus polypharmacy.   Assessment & Plan:  Principal Problem:   Septic shock (HCC) Active Problems:   Chronic HFrEF (heart failure with reduced ejection fraction) (HCC)   Diverticulitis of descending colon   Hypovolemic shock (HCC)   Accidental overdose    Concern for septic shock - Diverticulitis versus urinary tract infection versus hypovolemia versus drug overdose.  All of this is now improving.  No longer on vasopressor medications.  Blood pressure stable.  Urine cultures growing ESBL   Hypovolemia -Proved with hydration   Possible unintentional tizanidine  overdose -Unintentional, showing improvement   Acute diverticulitis of descending: -Continue Zosyn we will opt to treat for total 7-10 days with antibiotics. - Will need colonoscopy outpatient in 6-8 weeks  ESBL UTI Bilateral renal stones -Received a dose of fosfomycin, urine cultures growing 10K colonies.  We can add meropenem if spikes fevers -Flomax  -Eventually needs outpatient follow-up with urology.  Follows with at [facility] urology   Left nephrolithiasis (POA) - Likely contributing to patient's pain.  No hydroureter or hydronephrosis appreciated.  Tamsulosin  on board.  Will give Toradol 15 mg IV every 8 hours x 5.   chronic HFrEF -Initially hypovolemic but now appears to be well resuscitated.  Torsemide  80 mg twice daily started   COPD -Bronchodilators   Paroxysmal atrial fibrillation - Resuming Eliquis , metoprolol .   Physical debilitation muscle weakness - PT OT SNF    DVT prophylaxis: apixaban  (ELIQUIS ) tablet 5 mg     Code Status: Full  Code Family Communication:   Status is: Inpatient Remains inpatient appropriate because: Multiple ongoing issues, continue hospital stay    Subjective: Seen at bedside feels okay this morning.  Does not have any new complaints.   Examination:  General exam: Appears calm and comfortable, generally very weak and frail appearing Respiratory system: Clear to auscultation. Respiratory effort normal. Cardiovascular system: S1 & S2 heard, RRR. No JVD, murmurs, rubs, gallops or clicks. No pedal edema. Gastrointestinal system: Abdomen is nondistended, soft and nontender. No organomegaly or masses felt. Normal bowel sounds heard. Central nervous system: Alert and oriented. No focal neurological deficits. Extremities: Symmetric4 x 5 power. Skin: No rashes, lesions or ulcers Psychiatry: Judgement and insight appear normal. Mood & affect appropriate.                Diet Orders (From admission, onward)     Start     Ordered   04/12/24 1130  Diet Heart Room service appropriate? Yes; Fluid consistency: Thin  Diet effective now       Question Answer Comment  Room service appropriate? Yes   Fluid consistency: Thin      04/12/24 1129            Objective: Vitals:   04/15/24 0736 04/15/24 0746 04/15/24 1100 04/15/24 1135  BP:  120/62 122/66   Pulse: 71 70    Resp: 19 20 19 19   Temp:  98.2 F (36.8 C)  98.4 F (36.9 C)  TempSrc:  Oral  Oral  SpO2: 93% 92%    Weight:      Height:        Intake/Output Summary (Last 24 hours)  at 04/15/2024 1337 Last data filed at 04/15/2024 0800 Gross per 24 hour  Intake 699.73 ml  Output 1200 ml  Net -500.27 ml   Filed Weights   04/13/24 0452 04/13/24 1711 04/14/24 0437  Weight: 112.9 kg 112.9 kg 107.1 kg    Scheduled Meds:  apixaban   5 mg Oral BID   arformoterol   15 mcg Nebulization BID   budesonide  (PULMICORT ) nebulizer solution  0.25 mg Nebulization BID   doxepin   20 mg Oral QHS   gabapentin   100 mg Oral QHS   insulin  aspart   0-9 Units Subcutaneous TID WC   ketorolac  15 mg Intravenous Q8H   lidocaine   1 patch Transdermal Q24H   mupirocin  ointment   Nasal BID   pantoprazole   40 mg Oral BID   revefenacin   175 mcg Nebulization Daily   tamsulosin   0.4 mg Oral Daily   torsemide   80 mg Oral BID   Continuous Infusions:  piperacillin-tazobactam (ZOSYN)  IV 12.5 mL/hr at 04/15/24 0800    Nutritional status     Body mass index is 34.87 kg/m.  Data Reviewed:   CBC: Recent Labs  Lab 04/12/24 0414 04/13/24 0448 04/14/24 0449 04/15/24 0354  WBC 8.6 9.0 9.9 8.8  NEUTROABS 6.0  --   --   --   HGB 10.3* 11.4* 11.4* 11.2*  HCT 34.2* 37.8* 37.5* 37.2*  MCV 94.7 96.2 95.4 93.9  PLT 296 310 362 347   Basic Metabolic Panel: Recent Labs  Lab 04/12/24 0414 04/13/24 0448 04/14/24 0449 04/15/24 0354  NA 134* 133* 137 136  K 4.1 4.5 3.7 3.5  CL 96* 96* 92* 92*  CO2 29 29 33* 31  GLUCOSE 109* 92 84 101*  BUN 23 17 15 13   CREATININE 1.27* 1.05 1.03 1.07  CALCIUM  9.4 9.4 9.2 8.7*  MG  --  2.3 1.8 1.8  PHOS  --  4.1  --   --    GFR: Estimated Creatinine Clearance: 78.6 mL/min (by C-G formula based on SCr of 1.07 mg/dL). Liver Function Tests: Recent Labs  Lab 04/12/24 0414 04/14/24 0449  AST 67* 30  ALT 74* 49*  ALKPHOS 78 87  BILITOT 0.9 1.1  PROT 5.7* 6.7  ALBUMIN  2.8* 2.9*   Recent Labs  Lab 04/12/24 0414  LIPASE 40   No results for input(s): AMMONIA in the last 168 hours. Coagulation Profile: No results for input(s): INR, PROTIME in the last 168 hours. Cardiac Enzymes: No results for input(s): CKTOTAL, CKMB, CKMBINDEX, TROPONINI in the last 168 hours. BNP (last 3 results) No results for input(s): PROBNP in the last 8760 hours. HbA1C: No results for input(s): HGBA1C in the last 72 hours. CBG: Recent Labs  Lab 04/14/24 1105 04/14/24 1708 04/14/24 2138 04/15/24 0804 04/15/24 1132  GLUCAP 79 109* 109* 113* 118*   Lipid Profile: No results for input(s):  CHOL, HDL, LDLCALC, TRIG, CHOLHDL, LDLDIRECT in the last 72 hours. Thyroid  Function Tests: No results for input(s): TSH, T4TOTAL, FREET4, T3FREE, THYROIDAB in the last 72 hours. Anemia Panel: No results for input(s): VITAMINB12, FOLATE, FERRITIN, TIBC, IRON, RETICCTPCT in the last 72 hours. Sepsis Labs: Recent Labs  Lab 04/12/24 0744  LATICACIDVEN 1.5    Recent Results (from the past 240 hours)  Blood culture (routine x 2)     Status: None (Preliminary result)   Collection Time: 04/12/24  7:26 AM   Specimen: BLOOD  Result Value Ref Range Status   Specimen Description BLOOD SITE NOT SPECIFIED  Final  Special Requests   Final    BOTTLES DRAWN AEROBIC ONLY Blood Culture results may not be optimal due to an inadequate volume of blood received in culture bottles   Culture   Final    NO GROWTH 3 DAYS Performed at [facility], [address] [place], [place] [address]    Report Status PENDING  Incomplete  Blood culture (routine x 2)     Status: None (Preliminary result)   Collection Time: 04/12/24  7:31 AM   Specimen: BLOOD  Result Value Ref Range Status   Specimen Description BLOOD LEFT ANTECUBITAL  Final   Special Requests   Final    BOTTLES DRAWN AEROBIC AND ANAEROBIC Blood Culture adequate volume   Culture   Final    NO GROWTH 3 DAYS Performed at [facility], [address] [place], [place] [address]    Report Status PENDING  Incomplete  MRSA Next Gen by PCR, Nasal     Status: Abnormal   Collection Time: 04/12/24  8:21 AM   Specimen: Nasal Mucosa; Nasal Swab  Result Value Ref Range Status   MRSA by PCR Next Gen DETECTED (A) NOT DETECTED Final    Comment: RESULT CALLED TO, READ BACK BY AND VERIFIED WITH: RN OLE PIETY [id number] BY J RAZZAK,MT (NOTE) The GeneXpert MRSA Assay (FDA approved for NASAL specimens only), is one component of a comprehensive MRSA colonization surveillance program. It is not intended to  diagnose MRSA infection nor to guide or monitor treatment for MRSA infections. Test performance is not FDA approved in patients less than 17 years old. Performed at [facility], [address] [place], [place] [address]   Urine Culture     Status: Abnormal   Collection Time: 04/12/24  5:44 PM   Specimen: Urine, Clean Catch  Result Value Ref Range Status   Specimen Description URINE, CLEAN CATCH  Final   Special Requests   Final    NONE Performed at [facility], [address] [place], [place] [address]    Culture (A)  Final    10,000 COLONIES/mL ESCHERICHIA COLI Confirmed Extended Spectrum Beta-Lactamase Producer (ESBL).  In bloodstream infections from ESBL organisms, carbapenems are preferred over piperacillin/tazobactam. They are shown to have a lower risk of mortality.    Report Status 04/14/2024 FINAL  Final   Organism ID, Bacteria ESCHERICHIA COLI (A)  Final      Susceptibility   Escherichia coli - MIC*    AMPICILLIN >=32 RESISTANT Resistant     CEFAZOLIN  >=64 RESISTANT Resistant     CEFEPIME  >=32 RESISTANT Resistant     CEFTRIAXONE  >=64 RESISTANT Resistant     CIPROFLOXACIN  >=4 RESISTANT Resistant     GENTAMICIN  <=1 SENSITIVE Sensitive     IMIPENEM <=0.25 SENSITIVE Sensitive     NITROFURANTOIN <=16 SENSITIVE Sensitive     TRIMETH /SULFA  <=20 SENSITIVE Sensitive     AMPICILLIN/SULBACTAM >=32 RESISTANT Resistant     PIP/TAZO >=128 RESISTANT Resistant ug/mL    * 10,000 COLONIES/mL ESCHERICHIA COLI         Radiology Studies: No results found.         LOS: 3 days   Time spent= 35 mins    Burgess JAYSON Dare, MD [facility]  If 7PM-7AM, please contact night-coverage  04/15/2024, 1:37 PM

## 2024-04-15 NOTE — TOC Progression Note (Addendum)
 Transition of Care [facility]) - Progression Note    Patient Details  Name: [name] MRN: [id number] Date of Birth: 1956-08-31  Transition of [facility] CM/SW Contact  [name], LCSWA Phone Number: 04/15/2024, 10:35 AM  Clinical Narrative:     CSW spoke with patient at bedside. CSW provided SNF bed offers for review. Patient accepted SNF bed offer with [facility]. CSW awaiting to hear back from Gilman with [facility] to confirm SNF bed for patient. CSW will continue to follow and assist with patients dc planning needs.  Update- Rhonda with [facility] confirmed SNF bed for patient. CSW added patients facility choice to patients insurance authorization. Patients insurance authorization currently pending for SNF. CSW will continue to follow.   Expected Discharge Plan: Skilled Nursing Facility Barriers to Discharge: Continued Medical Work up  Expected Discharge Plan and Services In-house Referral: Clinical Social Work Discharge Planning Services: CM Consult Post Acute Care Choice: Skilled Nursing Facility Living arrangements for the past 2 months: Single Family Home                                       Social Determinants of Health (SDOH) Interventions SDOH Screenings   Food Insecurity: No Food Insecurity (04/13/2024)  Housing: Low Risk  (04/13/2024)  Recent Concern: Housing - High Risk (02/09/2024)  Transportation Needs: No Transportation Needs (04/13/2024)  Utilities: Not At Risk (04/13/2024)  Alcohol Screen: Low Risk  (09/16/2023)  Depression (PHQ2-9): Low Risk  (01/28/2024)  Financial Resource Strain: Medium Risk (01/01/2024)  Physical Activity: Inactive (01/01/2024)  Social Connections: Socially Isolated (04/13/2024)  Stress: No Stress Concern Present (01/01/2024)  Tobacco Use: Medium Risk (04/12/2024)    Readmission Risk Interventions    02/07/2024    2:21 PM 11/22/2022    1:34 PM  Readmission Risk Prevention Plan  Post Dischage Appt  Complete  Medication  Screening  Complete  Transportation Screening Complete Complete  PCP or Specialist Appt within 3-5 Days Complete   HRI or Home Care Consult Complete   Social Work Consult for Recovery Care Planning/Counseling Complete   Palliative Care Screening Not Applicable   Medication Review Oceanographer) Referral to Pharmacy

## 2024-04-15 NOTE — Progress Notes (Signed)
 Physical Therapy Treatment Patient Details Name: [name] MRN: [id number] DOB: Aug 15, 1956 Today's Date: 04/15/2024   History of Present Illness 68 year old man brought to the ED via concern for unintentional tizanidine  overdose with complaints of left lower quadrant abdominal pain found to have diverticulitis and low blood pressure convert septic shock related to diverticulitis versus polypharmacy. PMH: morbid obesity, OSA, chronic pain syndrome, GERD, fibromyalgia, COPD    PT Comments  Pt making slow, steady progress. Amb very short distance in room with assist. Fatigues quickly with activity. Patient will benefit from continued inpatient follow up therapy, <3 hours/day     If plan is discharge home, recommend the following: A little help with walking and/or transfers;A little help with bathing/dressing/bathroom;Assistance with cooking/housework;Assist for transportation;Help with stairs or ramp for entrance   Can travel by private vehicle     Yes  Equipment Recommendations  None recommended by PT    Recommendations for Other Services       Precautions / Restrictions Precautions Precautions: Fall Recall of Precautions/Restrictions: Impaired Restrictions Weight Bearing Restrictions Per Provider Order: No     Mobility  Bed Mobility Overal bed mobility: Needs Assistance Bed Mobility: Supine to Sit     Supine to sit: Min assist     General bed mobility comments: Assist to elevate trunk into sitting. Pt sleeps in recliner at home and not a bed.    Transfers Overall transfer level: Needs assistance Equipment used: Rolling walker (2 wheels) Transfers: Sit to/from Stand, Bed to chair/wheelchair/BSC Sit to Stand: Min assist   Step pivot transfers: Min assist       General transfer comment: Assist to power up. Small shuffling steps bed to recliner.    Ambulation/Gait Ambulation/Gait assistance: Min assist Gait Distance (Feet): 7 Feet (x 2) Assistive device:  Rolling walker (2 wheels) Gait Pattern/deviations: Step-through pattern, Decreased step length - right, Decreased step length - left, Shuffle Gait velocity: decr Gait velocity interpretation: <1.31 ft/sec, indicative of household ambulator   General Gait Details: Assist for balance. Pt fatigues quickly and chair brought behind pt to allow frequent sitting rest breaks.   Stairs             Wheelchair Mobility     Tilt Bed    Modified Rankin (Stroke Patients Only)       Balance Overall balance assessment: Needs assistance Sitting-balance support: Feet supported, No upper extremity supported Sitting balance-Leahy Scale: Fair     Standing balance support: Bilateral upper extremity supported Standing balance-Leahy Scale: Poor Standing balance comment: walker and CGA for static standing                            Communication Communication Communication: No apparent difficulties  Cognition Arousal: Alert Behavior During Therapy: WFL for tasks assessed/performed   PT - Cognitive impairments: No apparent impairments                         Following commands: Intact      Cueing Cueing Techniques: Verbal cues  Exercises      General Comments General comments (skin integrity, edema, etc.): Pt on 4L O2 with SpO2 high 80% with activity      Pertinent Vitals/Pain Pain Assessment Pain Assessment: Faces Faces Pain Scale: Hurts little more Pain Location: neck Pain Descriptors / Indicators: Aching Pain Intervention(s): Limited activity within patient's tolerance, Repositioned    Home Living  Prior Function            PT Goals (current goals can now be found in the care plan section) Acute Rehab PT Goals Patient Stated Goal: Go home Progress towards PT goals: Progressing toward goals    Frequency    Min 2X/week      PT Plan      Co-evaluation              AM-PAC PT 6 Clicks Mobility    Outcome Measure  Help needed turning from your back to your side while in a flat bed without using bedrails?: A Little Help needed moving from lying on your back to sitting on the side of a flat bed without using bedrails?: A Little Help needed moving to and from a bed to a chair (including a wheelchair)?: A Little Help needed standing up from a chair using your arms (e.g., wheelchair or bedside chair)?: A Little Help needed to walk in hospital room?: Total Help needed climbing 3-5 steps with a railing? : Total 6 Click Score: 14    End of Session Equipment Utilized During Treatment: Oxygen  Activity Tolerance: Patient limited by fatigue Patient left: in chair;with call bell/phone within reach;with chair alarm set Nurse Communication: Mobility status PT Visit Diagnosis: Unsteadiness on feet (R26.81);Other abnormalities of gait and mobility (R26.89);Muscle weakness (generalized) (M62.81);History of falling (Z91.81)     Time: 8744-8664 PT Time Calculation (min) (ACUTE ONLY): 40 min  Charges:    $Gait Training: 38-52 mins PT General Charges $$ ACUTE PT VISIT: 1 Visit                     [facility] PT Acute Rehabilitation Services Office [phone]    Rodgers ORN [facility] 04/15/2024, 2:31 PM

## 2024-04-16 ENCOUNTER — Inpatient Hospital Stay (HOSPITAL_COMMUNITY)

## 2024-04-16 DIAGNOSIS — A419 Sepsis, unspecified organism: Secondary | ICD-10-CM | POA: Diagnosis not present

## 2024-04-16 DIAGNOSIS — R6521 Severe sepsis with septic shock: Secondary | ICD-10-CM | POA: Diagnosis not present

## 2024-04-16 LAB — CBC
HCT: 35.5 % — ABNORMAL LOW (ref 39.0–52.0)
Hemoglobin: 10.9 g/dL — ABNORMAL LOW (ref 13.0–17.0)
MCH: 28.8 pg (ref 26.0–34.0)
MCHC: 30.7 g/dL (ref 30.0–36.0)
MCV: 93.7 fL (ref 80.0–100.0)
Platelets: 347 10*3/uL (ref 150–400)
RBC: 3.79 MIL/uL — ABNORMAL LOW (ref 4.22–5.81)
RDW: 15.2 % (ref 11.5–15.5)
WBC: 7.5 10*3/uL (ref 4.0–10.5)
nRBC: 0 % (ref 0.0–0.2)

## 2024-04-16 LAB — BASIC METABOLIC PANEL WITH GFR
Anion gap: 12 (ref 5–15)
BUN: 11 mg/dL (ref 8–23)
CO2: 35 mmol/L — ABNORMAL HIGH (ref 22–32)
Calcium: 9.1 mg/dL (ref 8.9–10.3)
Chloride: 89 mmol/L — ABNORMAL LOW (ref 98–111)
Creatinine, Ser: 0.93 mg/dL (ref 0.61–1.24)
GFR, Estimated: >60 mL/min (ref >60)
Glucose, Bld: 99 mg/dL (ref 70–99)
Potassium: 3.2 mmol/L — ABNORMAL LOW (ref 3.5–5.1)
Sodium: 136 mmol/L (ref 135–145)

## 2024-04-16 LAB — TROPONIN I (HIGH SENSITIVITY)
Troponin I (High Sensitivity): 26 ng/L — ABNORMAL HIGH (ref <18)
Troponin I (High Sensitivity): 26 ng/L — ABNORMAL HIGH (ref <18)

## 2024-04-16 LAB — MAGNESIUM: Magnesium: 1.8 mg/dL (ref 1.7–2.4)

## 2024-04-16 MED ORDER — NITROGLYCERIN 0.4 MG SL SUBL
SUBLINGUAL_TABLET | SUBLINGUAL | Status: AC
  Filled 2024-04-16: qty 1

## 2024-04-16 MED ORDER — DIPHENHYDRAMINE HCL 25 MG PO CAPS
25.0000 mg | ORAL_CAPSULE | Freq: Once | ORAL | Status: AC | PRN
Start: 2024-04-16 — End: 2024-04-16
  Administered 2024-04-16: 25 mg via ORAL
  Filled 2024-04-16: qty 1

## 2024-04-16 MED ORDER — POTASSIUM CHLORIDE CRYS ER 20 MEQ PO TBCR
40.0000 meq | EXTENDED_RELEASE_TABLET | Freq: Once | ORAL | Status: AC
  Administered 2024-04-16: 40 meq via ORAL
  Filled 2024-04-16: qty 2

## 2024-04-16 MED ORDER — NITROGLYCERIN 0.4 MG SL SUBL
0.4000 mg | SUBLINGUAL_TABLET | SUBLINGUAL | Status: DC | PRN
  Administered 2024-04-16: 0.4 mg via SUBLINGUAL

## 2024-04-16 MED ORDER — LORAZEPAM 0.5 MG PO TABS
0.5000 mg | ORAL_TABLET | Freq: Once | ORAL | Status: AC | PRN
Start: 2024-04-16 — End: 2024-04-16
  Administered 2024-04-16: 0.5 mg via ORAL
  Filled 2024-04-16: qty 1

## 2024-04-16 NOTE — Progress Notes (Signed)
 Overnight floor coverage  Informed by RN that patient woke up diaphoretic and complained of 6/10 intensity substernal chest pain and also shortness of breath.  Oxygen  saturation 88-92% on 4 L .  Respiratory rate 20.  Heart rate in the 70s.  Temperature 98.0 F.  He was given sublingual nitroglycerin  after which his chest pain completely resolved.  Blood pressure 98/65 after nitroglycerin .  -Stat EKG, troponin x 2, and chest x-ray ordered

## 2024-04-16 NOTE — TOC Progression Note (Addendum)
 Transition of Care [facility]) - Progression Note    Patient Details  Name: [name] MRN: [id number] Date of Birth: Jan 25, 1956  Transition of Care [facility]) CM/SW Contact  [name], LCSWA Phone Number: 04/16/2024, 10:23 AM  Clinical Narrative:     Patients insurance authorization currently pending for SNF. CSW will continue to follow.  Update- CSW followed up with patients insurance who confirmed patients insurance authorization currently still pending for SNF.   Update- [name] in financial counseling informed CSW that she informed patient that he does not qualify for Medicaid.[name] informed patient she will follow his account to see if he qualify's for financial assistance once he is discharged.   Expected Discharge Plan: Skilled Nursing Facility Barriers to Discharge: Continued Medical Work up  Expected Discharge Plan and Services In-house Referral: Clinical Social Work Discharge Planning Services: CM Consult Post Acute Care Choice: Skilled Nursing Facility Living arrangements for the past 2 months: Single Family Home                                       Social Determinants of Health (SDOH) Interventions SDOH Screenings   Food Insecurity: No Food Insecurity (04/13/2024)  Housing: Low Risk  (04/13/2024)  Recent Concern: Housing - High Risk (02/09/2024)  Transportation Needs: No Transportation Needs (04/13/2024)  Utilities: Not At Risk (04/13/2024)  Alcohol Screen: Low Risk  (09/16/2023)  Depression (PHQ2-9): Low Risk  (01/28/2024)  Financial Resource Strain: Medium Risk (01/01/2024)  Physical Activity: Inactive (01/01/2024)  Social Connections: Socially Isolated (04/13/2024)  Stress: No Stress Concern Present (01/01/2024)  Tobacco Use: Medium Risk (04/12/2024)    Readmission Risk Interventions    02/07/2024    2:21 PM 11/22/2022    1:34 PM  Readmission Risk Prevention Plan  Post Dischage Appt  Complete  Medication Screening  Complete  Transportation Screening  Complete Complete  PCP or Specialist Appt within 3-5 Days Complete   HRI or Home Care Consult Complete   Social Work Consult for Recovery Care Planning/Counseling Complete   Palliative Care Screening Not Applicable   Medication Review Oceanographer) Referral to Pharmacy

## 2024-04-16 NOTE — Progress Notes (Signed)
 PT Cancellation Note  Patient Details Name: [name] MRN: [id number] DOB: 1956/08/27   Cancelled Treatment:    Reason Eval/Treat Not Completed: Medical issues which prohibited therapy. Per chart review of Hospitalist note this AM,  pt woke up diaphoretic and complained of 6/10 intensity substernal chest pain and also shortness of breath.  Oxygen  saturation 88-92% on 4 L [place].  Respiratory rate 20.  Heart rate in the 70s.  Temperature 98.0 F.  He was given sublingual nitroglycerin  after which his chest pain completely resolved.  Blood pressure 98/65 after nitroglycerin . Stat EKG, troponin x 2, and chest x-ray ordered PT will hold and await results of cardiac investigation.     Delon HERO Omarr Hann 04/16/2024, 9:15 AM

## 2024-04-16 NOTE — Progress Notes (Signed)
 PROGRESS NOTE    [name]  FMW:[id number] DOB: [birth year] DOA: 04/12/2024 PCP: Norleen Lynwood ORN, MD    Brief Narrative:   68 year old man brought to the ED via concern for unintentional tizanidine  overdose with complaints of left lower quadrant abdominal pain found to have diverticulitis and low blood pressure convert septic shock related to diverticulitis versus polypharmacy.   Assessment & Plan:  Principal Problem:   Septic shock (HCC) Active Problems:   Chronic HFrEF (heart failure with reduced ejection fraction) (HCC)   Diverticulitis of descending colon   Hypovolemic shock (HCC)   Accidental overdose    Concern for septic shock - Diverticulitis versus urinary tract infection versus hypovolemia versus drug overdose.  All of this is now improving.  No longer on vasopressor medications.  Blood pressure stable.  Urine cultures growing ESBL   Hypovolemia -Proved with hydration  Hypokalemia - Repletion   Possible unintentional tizanidine  overdose -Unintentional, showing improvement   Acute diverticulitis of descending: -Continue Zosyn, EOT 7/8 - Will need colonoscopy outpatient in 6-8 weeks  Atypical intermittent chest pain -Trop are neg, Nonischemic ekg  ESBL UTI Bilateral renal stones -Received a dose of fosfomycin, urine cultures growing 10K colonies.  We can add meropenem if spikes fevers -Flomax  -Eventually needs outpatient follow-up with urology.  Follows with at [facility] urology   Left nephrolithiasis (POA) - Likely contributing to patient's pain.  No hydroureter or hydronephrosis appreciated.  Tamsulosin  on board. Toradol   chronic HFrEF -Initially hypovolemic but now appears to be well resuscitated.  Torsemide  80 mg twice daily started   COPD -Bronchodilators   Paroxysmal atrial fibrillation - Resuming Eliquis , metoprolol .   Physical debilitation muscle weakness - PT OT SNF  TOC consulted.   DVT prophylaxis: apixaban  (ELIQUIS ) tablet 5 mg      Code Status: Full Code Family Communication:   Status is: Inpatient Remains inpatient appropriate because: snf placement   Subjective: Seen at bedside feels okay this morning.   Overnight had chest pain  Examination:  General exam: Appears calm and comfortable, generally very weak and frail appearing Respiratory system: Clear to auscultation. Respiratory effort normal. Cardiovascular system: S1 & S2 heard, RRR. No JVD, murmurs, rubs, gallops or clicks. No pedal edema. Gastrointestinal system: Abdomen is nondistended, soft and nontender. No organomegaly or masses felt. Normal bowel sounds heard. Central nervous system: Alert and oriented. No focal neurological deficits. Extremities: Symmetric4 x 5 power. Skin: No rashes, lesions or ulcers Psychiatry: Judgement and insight appear normal. Mood & affect appropriate.                Diet Orders (From admission, onward)     Start     Ordered   04/12/24 1130  Diet Heart Room service appropriate? Yes; Fluid consistency: Thin  Diet effective now       Question Answer Comment  Room service appropriate? Yes   Fluid consistency: Thin      04/12/24 1129            Objective: Vitals:   04/16/24 0542 04/16/24 0608 04/16/24 0614 04/16/24 0826  BP: 119/67 112/69 (!) 87/61 103/64  Pulse: 75 70  70  Resp: 18   18  Temp:    98.2 F (36.8 C)  TempSrc:    Oral  SpO2: 90% 95%  95%  Weight: 106.7 kg     Height:        Intake/Output Summary (Last 24 hours) at 04/16/2024 1114 Last data filed at 04/16/2024 0827 Gross per 24  hour  Intake 1043.13 ml  Output 1600 ml  Net -556.87 ml   Filed Weights   04/13/24 1711 04/14/24 0437 04/16/24 0542  Weight: 112.9 kg 107.1 kg 106.7 kg    Scheduled Meds:  apixaban   5 mg Oral BID   arformoterol   15 mcg Nebulization BID   budesonide  (PULMICORT ) nebulizer solution  0.25 mg Nebulization BID   doxepin   20 mg Oral QHS   gabapentin   100 mg Oral QHS   ketorolac  15 mg Intravenous Q8H    lidocaine   1 patch Transdermal Q24H   mupirocin  ointment   Nasal BID   pantoprazole   40 mg Oral BID   revefenacin   175 mcg Nebulization Daily   tamsulosin   0.4 mg Oral Daily   torsemide   80 mg Oral BID   Continuous Infusions:  piperacillin-tazobactam (ZOSYN)  IV 3.375 g (04/16/24 0624)    Nutritional status     Body mass index is 34.74 kg/m.  Data Reviewed:   CBC: Recent Labs  Lab 04/12/24 0414 04/13/24 0448 04/14/24 0449 04/15/24 0354 04/16/24 0411  WBC 8.6 9.0 9.9 8.8 7.5  NEUTROABS 6.0  --   --   --   --   HGB 10.3* 11.4* 11.4* 11.2* 10.9*  HCT 34.2* 37.8* 37.5* 37.2* 35.5*  MCV 94.7 96.2 95.4 93.9 93.7  PLT 296 310 362 347 347   Basic Metabolic Panel: Recent Labs  Lab 04/12/24 0414 04/13/24 0448 04/14/24 0449 04/15/24 0354 04/16/24 0411  NA 134* 133* 137 136 136  K 4.1 4.5 3.7 3.5 3.2*  CL 96* 96* 92* 92* 89*  CO2 29 29 33* 31 35*  GLUCOSE 109* 92 84 101* 99  BUN 23 17 15 13 11   CREATININE 1.27* 1.05 1.03 1.07 0.93  CALCIUM  9.4 9.4 9.2 8.7* 9.1  MG  --  2.3 1.8 1.8 1.8  PHOS  --  4.1  --   --   --    GFR: Estimated Creatinine Clearance: 90.2 mL/min (by C-G formula based on SCr of 0.93 mg/dL). Liver Function Tests: Recent Labs  Lab 04/12/24 0414 04/14/24 0449  AST 67* 30  ALT 74* 49*  ALKPHOS 78 87  BILITOT 0.9 1.1  PROT 5.7* 6.7  ALBUMIN  2.8* 2.9*   Recent Labs  Lab 04/12/24 0414  LIPASE 40   No results for input(s): AMMONIA in the last 168 hours. Coagulation Profile: No results for input(s): INR, PROTIME in the last 168 hours. Cardiac Enzymes: No results for input(s): CKTOTAL, CKMB, CKMBINDEX, TROPONINI in the last 168 hours. BNP (last 3 results) No results for input(s): PROBNP in the last 8760 hours. HbA1C: No results for input(s): HGBA1C in the last 72 hours. CBG: Recent Labs  Lab 04/14/24 1708 04/14/24 2138 04/15/24 0804 04/15/24 1132 04/15/24 1607  GLUCAP 109* 109* 113* 118* 101*   Lipid Profile: No  results for input(s): CHOL, HDL, LDLCALC, TRIG, CHOLHDL, LDLDIRECT in the last 72 hours. Thyroid  Function Tests: No results for input(s): TSH, T4TOTAL, FREET4, T3FREE, THYROIDAB in the last 72 hours. Anemia Panel: No results for input(s): VITAMINB12, FOLATE, FERRITIN, TIBC, IRON, RETICCTPCT in the last 72 hours. Sepsis Labs: Recent Labs  Lab 04/12/24 0744  LATICACIDVEN 1.5    Recent Results (from the past 240 hours)  Blood culture (routine x 2)     Status: None (Preliminary result)   Collection Time: 04/12/24  7:26 AM   Specimen: BLOOD  Result Value Ref Range Status   Specimen Description BLOOD SITE NOT SPECIFIED  Final   Special Requests   Final    BOTTLES DRAWN AEROBIC ONLY Blood Culture results may not be optimal due to an inadequate volume of blood received in culture bottles   Culture   Final    NO GROWTH 4 DAYS Performed at [facility], [address], [place], [place] [address]    Report Status PENDING  Incomplete  Blood culture (routine x 2)     Status: None (Preliminary result)   Collection Time: 04/12/24  7:31 AM   Specimen: BLOOD  Result Value Ref Range Status   Specimen Description BLOOD LEFT ANTECUBITAL  Final   Special Requests   Final    BOTTLES DRAWN AEROBIC AND ANAEROBIC Blood Culture adequate volume   Culture   Final    NO GROWTH 4 DAYS Performed at [facility], [address], [place], [place] [address]    Report Status PENDING  Incomplete  MRSA Next Gen by PCR, Nasal     Status: Abnormal   Collection Time: 04/12/24  8:21 AM   Specimen: Nasal Mucosa; Nasal Swab  Result Value Ref Range Status   MRSA by PCR Next Gen DETECTED (A) NOT DETECTED Final    Comment: RESULT CALLED TO, READ BACK BY AND VERIFIED WITH: RN OLE PIETY [id number] BY J RAZZAK,MT (NOTE) The GeneXpert MRSA Assay (FDA approved for NASAL specimens only), is one component of a comprehensive MRSA colonization surveillance program. It  is not intended to diagnose MRSA infection nor to guide or monitor treatment for MRSA infections. Test performance is not FDA approved in patients less than 29 years old. Performed at [facility], [address], [place], [place] [address]   Urine Culture     Status: Abnormal   Collection Time: 04/12/24  5:44 PM   Specimen: Urine, Clean Catch  Result Value Ref Range Status   Specimen Description URINE, CLEAN CATCH  Final   Special Requests   Final    NONE Performed at [facility], [address], [place], [place] [address]    Culture (A)  Final    10,000 COLONIES/mL ESCHERICHIA COLI Confirmed Extended Spectrum Beta-Lactamase Producer (ESBL).  In bloodstream infections from ESBL organisms, carbapenems are preferred over piperacillin/tazobactam. They are shown to have a lower risk of mortality.    Report Status 04/14/2024 FINAL  Final   Organism ID, Bacteria ESCHERICHIA COLI (A)  Final      Susceptibility   Escherichia coli - MIC*    AMPICILLIN >=32 RESISTANT Resistant     CEFAZOLIN  >=64 RESISTANT Resistant     CEFEPIME  >=32 RESISTANT Resistant     CEFTRIAXONE  >=64 RESISTANT Resistant     CIPROFLOXACIN  >=4 RESISTANT Resistant     GENTAMICIN  <=1 SENSITIVE Sensitive     IMIPENEM <=0.25 SENSITIVE Sensitive     NITROFURANTOIN <=16 SENSITIVE Sensitive     TRIMETH /SULFA  <=20 SENSITIVE Sensitive     AMPICILLIN/SULBACTAM >=32 RESISTANT Resistant     PIP/TAZO >=128 RESISTANT Resistant ug/mL    * 10,000 COLONIES/mL ESCHERICHIA COLI         Radiology Studies: DG CHEST PORT 1 VIEW Result Date: 04/16/2024 CLINICAL DATA:  68 year old male with shortness of breath and chest pain. EXAM: PORTABLE CHEST 1 VIEW COMPARISON:  Portable chest 02/06/2024 and earlier. FINDINGS: Portable AP semi upright view at 0639 hours. Improved lung volumes and some improved ventilation since April. Stable cardiac size and mediastinal contours. Chronic left chest AICD. No pneumothorax. No pulmonary  edema. Evidence of ongoing small to moderate left layering pleural effusion with lung base opacity most likely atelectasis. Chronic right upper lobe peribronchial streaky opacity probably atelectasis or scarring, similar to a 2021 CTA chest. No overt edema. No pneumothorax. Paucity of bowel gas. Stable visualized osseous structures. IMPRESSION: 1. Improved lung volumes and ventilation since April but ongoing left lung base opacity favored to be small or moderate left pleural effusion with atelectasis. 2. Chronic right upper lobe suspected atelectasis or scarring. Electronically Signed   By: VEAR Hurst M.D.   On: 04/16/2024 07:00           LOS: 4 days   Time spent= 35 mins    Burgess JAYSON Dare, MD [facility]  If 7PM-7AM, please contact night-coverage  04/16/2024, 11:14 AM

## 2024-04-16 NOTE — Progress Notes (Signed)
 Occupational Therapy Treatment Patient Details Name: [name] MRN: [id number] DOB: 06/03/1956 Today's Date: 04/16/2024   History of present illness 68 year old man brought to the ED via concern for unintentional tizanidine  overdose with complaints of left lower quadrant abdominal pain found to have diverticulitis and low blood pressure convert septic shock related to diverticulitis versus polypharmacy. PMH: morbid obesity, OSA, chronic pain syndrome, GERD, fibromyalgia, COPD   OT comments  COTA checked with nursing prior to visit. Nursing stating patient with chest pain earlier but okay for OT treat with limited mobility. Patient agreeable to OT treatment with min assist and increased time to get to EOB. Patient able to stand from EOB for peri area bathing with mod assist and seated rest break following. Patient able to transfer to recliner with min assist to power up and to steady for step pivot transfer. Patient performed grooming with setup while seated up in recliner. At end of session SpO2 93% on 5 liters, HR 74, BP 111/68 (82). Patient will benefit from continued inpatient follow up therapy, <3 hours/day due to family unable to provide support at this time. Acute OT to continue to follow to address established goals to facilitate DC to next venue of care.        If plan is discharge home, recommend the following:  A little help with walking and/or transfers;A lot of help with bathing/dressing/bathroom;Assistance with cooking/housework;Assist for transportation;Help with stairs or ramp for entrance   Equipment Recommendations  None recommended by OT    Recommendations for Other Services      Precautions / Restrictions Precautions Precautions: Fall Recall of Precautions/Restrictions: Impaired Restrictions Weight Bearing Restrictions Per Provider Order: No       Mobility Bed Mobility Overal bed mobility: Needs Assistance Bed Mobility: Supine to Sit     Supine to sit: Min  assist     General bed mobility comments: increased time and assistance with BLE and trunk    Transfers Overall transfer level: Needs assistance Equipment used: Rolling walker (2 wheels) Transfers: Sit to/from Stand, Bed to chair/wheelchair/BSC Sit to Stand: Min assist, From elevated surface     Step pivot transfers: Min assist     General transfer comment: min assist to stand from raised bed and to steady with transfers     Balance Overall balance assessment: Needs assistance Sitting-balance support: Feet supported, No upper extremity supported Sitting balance-Leahy Scale: Fair Sitting balance - Comments: EOB   Standing balance support: Bilateral upper extremity supported Standing balance-Leahy Scale: Poor Standing balance comment: reliant on BUE support when standing                           ADL either performed or assessed with clinical judgement   ADL Overall ADL's : Needs assistance/impaired     Grooming: Wash/dry hands;Wash/dry face;Oral care;Set up;Sitting       Lower Body Bathing: Maximal assistance;Sit to/from stand Lower Body Bathing Details (indicate cue type and reason): patient stood from EOB for peri area bottom cleaning     Lower Body Dressing: Maximal assistance;Sitting/lateral leans Lower Body Dressing Details (indicate cue type and reason): to donn socks Toilet Transfer: Minimal assistance Toilet Transfer Details (indicate cue type and reason): simulated in room           General ADL Comments: limited standing and mobility with ADLs due to nursing requesting limited mobility due to chest pain earlier    Extremity/Trunk Assessment  Vision       Perception     Praxis     Communication Communication Communication: No apparent difficulties   Cognition Arousal: Alert Behavior During Therapy: WFL for tasks assessed/performed Cognition: No apparent impairments                                Following commands: Intact Following commands impaired: Follows multi-step commands with increased time, Follows multi-step commands inconsistently      Cueing   Cueing Techniques: Verbal cues  Exercises      Shoulder Instructions       General Comments 5 liters O2 SpO2 93%, HR 74, BP 111/68 (82)    Pertinent Vitals/ Pain       Pain Assessment Pain Assessment: Faces Faces Pain Scale: Hurts a little bit Pain Location: neck Pain Descriptors / Indicators: Aching Pain Intervention(s): Limited activity within patient's tolerance, Monitored during session  Home Living                                          Prior Functioning/Environment              Frequency  Min 2X/week        Progress Toward Goals  OT Goals(current goals can now be found in the care plan section)  Progress towards OT goals: Progressing toward goals  Acute Rehab OT Goals Patient Stated Goal: to get more rehab OT Goal Formulation: With patient Time For Goal Achievement: 04/27/24 Potential to Achieve Goals: Good ADL Goals Pt Will Perform Grooming: with modified independence;standing;sitting Pt Will Perform Lower Body Dressing: with supervision;sit to/from stand;sitting/lateral leans Pt Will Transfer to Toilet: with supervision;ambulating;bedside commode Pt Will Perform Toileting - Clothing Manipulation and hygiene: with supervision;sit to/from stand;sitting/lateral leans  Plan      Co-evaluation                 AM-PAC OT 6 Clicks Daily Activity     Outcome Measure   Help from another person eating meals?: A Little Help from another person taking care of personal grooming?: A Little Help from another person toileting, which includes using toliet, bedpan, or urinal?: A Lot Help from another person bathing (including washing, rinsing, drying)?: A Lot Help from another person to put on and taking off regular upper body clothing?: A Little Help from another person  to put on and taking off regular lower body clothing?: A Lot 6 Click Score: 15    End of Session Equipment Utilized During Treatment: Rolling walker (2 wheels);Oxygen  (5 liters)  OT Visit Diagnosis: Other abnormalities of gait and mobility (R26.89);Muscle weakness (generalized) (M62.81);Pain;History of falling (Z91.81) Pain - part of body:  (neck)   Activity Tolerance Patient limited by fatigue   Patient Left in chair;with call bell/phone within reach;with chair alarm set   Nurse Communication Mobility status;Precautions        Time: [phone] OT Time Calculation (min): 26 min  Charges: OT General Charges $OT Visit: 1 Visit OT Treatments $Self Care/Home Management : 23-37 mins  Dick Laine, OTA Acute Rehabilitation Services  Office [phone]   Jeb LITTIE Laine 04/16/2024, 12:23 PM

## 2024-04-17 DIAGNOSIS — I480 Paroxysmal atrial fibrillation: Secondary | ICD-10-CM | POA: Diagnosis not present

## 2024-04-17 DIAGNOSIS — K219 Gastro-esophageal reflux disease without esophagitis: Secondary | ICD-10-CM | POA: Diagnosis not present

## 2024-04-17 DIAGNOSIS — I429 Cardiomyopathy, unspecified: Secondary | ICD-10-CM | POA: Diagnosis not present

## 2024-04-17 DIAGNOSIS — R1310 Dysphagia, unspecified: Secondary | ICD-10-CM | POA: Diagnosis not present

## 2024-04-17 DIAGNOSIS — R6521 Severe sepsis with septic shock: Secondary | ICD-10-CM | POA: Diagnosis not present

## 2024-04-17 DIAGNOSIS — Z8719 Personal history of other diseases of the digestive system: Secondary | ICD-10-CM | POA: Diagnosis not present

## 2024-04-17 DIAGNOSIS — Z87891 Personal history of nicotine dependence: Secondary | ICD-10-CM | POA: Diagnosis not present

## 2024-04-17 DIAGNOSIS — R262 Difficulty in walking, not elsewhere classified: Secondary | ICD-10-CM | POA: Diagnosis not present

## 2024-04-17 DIAGNOSIS — J449 Chronic obstructive pulmonary disease, unspecified: Secondary | ICD-10-CM | POA: Diagnosis not present

## 2024-04-17 DIAGNOSIS — Z7401 Bed confinement status: Secondary | ICD-10-CM | POA: Diagnosis not present

## 2024-04-17 DIAGNOSIS — T481X1D Poisoning by skeletal muscle relaxants [neuromuscular blocking agents], accidental (unintentional), subsequent encounter: Secondary | ICD-10-CM | POA: Diagnosis not present

## 2024-04-17 DIAGNOSIS — G243 Spasmodic torticollis: Secondary | ICD-10-CM | POA: Diagnosis not present

## 2024-04-17 DIAGNOSIS — I119 Hypertensive heart disease without heart failure: Secondary | ICD-10-CM | POA: Diagnosis not present

## 2024-04-17 DIAGNOSIS — I6523 Occlusion and stenosis of bilateral carotid arteries: Secondary | ICD-10-CM | POA: Diagnosis not present

## 2024-04-17 DIAGNOSIS — K5904 Chronic idiopathic constipation: Secondary | ICD-10-CM | POA: Diagnosis not present

## 2024-04-17 DIAGNOSIS — J90 Pleural effusion, not elsewhere classified: Secondary | ICD-10-CM | POA: Diagnosis not present

## 2024-04-17 DIAGNOSIS — R918 Other nonspecific abnormal finding of lung field: Secondary | ICD-10-CM | POA: Diagnosis not present

## 2024-04-17 DIAGNOSIS — F32A Depression, unspecified: Secondary | ICD-10-CM | POA: Diagnosis not present

## 2024-04-17 DIAGNOSIS — M79641 Pain in right hand: Secondary | ICD-10-CM | POA: Diagnosis not present

## 2024-04-17 DIAGNOSIS — Z8616 Personal history of COVID-19: Secondary | ICD-10-CM | POA: Diagnosis not present

## 2024-04-17 DIAGNOSIS — K224 Dyskinesia of esophagus: Secondary | ICD-10-CM | POA: Diagnosis not present

## 2024-04-17 DIAGNOSIS — K5732 Diverticulitis of large intestine without perforation or abscess without bleeding: Secondary | ICD-10-CM | POA: Diagnosis not present

## 2024-04-17 DIAGNOSIS — K5792 Diverticulitis of intestine, part unspecified, without perforation or abscess without bleeding: Secondary | ICD-10-CM | POA: Diagnosis not present

## 2024-04-17 DIAGNOSIS — Z5986 Financial insecurity: Secondary | ICD-10-CM | POA: Diagnosis not present

## 2024-04-17 DIAGNOSIS — L89890 Pressure ulcer of other site, unstageable: Secondary | ICD-10-CM | POA: Diagnosis not present

## 2024-04-17 DIAGNOSIS — I10 Essential (primary) hypertension: Secondary | ICD-10-CM | POA: Diagnosis not present

## 2024-04-17 DIAGNOSIS — E119 Type 2 diabetes mellitus without complications: Secondary | ICD-10-CM | POA: Diagnosis not present

## 2024-04-17 DIAGNOSIS — Z95810 Presence of automatic (implantable) cardiac defibrillator: Secondary | ICD-10-CM | POA: Diagnosis not present

## 2024-04-17 DIAGNOSIS — R55 Syncope and collapse: Secondary | ICD-10-CM | POA: Diagnosis not present

## 2024-04-17 DIAGNOSIS — A419 Sepsis, unspecified organism: Secondary | ICD-10-CM | POA: Diagnosis not present

## 2024-04-17 DIAGNOSIS — K5909 Other constipation: Secondary | ICD-10-CM | POA: Diagnosis not present

## 2024-04-17 DIAGNOSIS — R1319 Other dysphagia: Secondary | ICD-10-CM | POA: Diagnosis present

## 2024-04-17 DIAGNOSIS — R0989 Other specified symptoms and signs involving the circulatory and respiratory systems: Secondary | ICD-10-CM | POA: Diagnosis not present

## 2024-04-17 DIAGNOSIS — N390 Urinary tract infection, site not specified: Secondary | ICD-10-CM | POA: Diagnosis not present

## 2024-04-17 DIAGNOSIS — M797 Fibromyalgia: Secondary | ICD-10-CM | POA: Diagnosis not present

## 2024-04-17 DIAGNOSIS — R42 Dizziness and giddiness: Secondary | ICD-10-CM | POA: Diagnosis present

## 2024-04-17 DIAGNOSIS — E1140 Type 2 diabetes mellitus with diabetic neuropathy, unspecified: Secondary | ICD-10-CM | POA: Diagnosis not present

## 2024-04-17 DIAGNOSIS — R1312 Dysphagia, oropharyngeal phase: Secondary | ICD-10-CM | POA: Diagnosis not present

## 2024-04-17 DIAGNOSIS — R079 Chest pain, unspecified: Secondary | ICD-10-CM | POA: Diagnosis not present

## 2024-04-17 DIAGNOSIS — R531 Weakness: Secondary | ICD-10-CM | POA: Diagnosis not present

## 2024-04-17 DIAGNOSIS — M6281 Muscle weakness (generalized): Secondary | ICD-10-CM | POA: Diagnosis not present

## 2024-04-17 LAB — CULTURE, BLOOD (ROUTINE X 2)
Culture: NO GROWTH
Culture: NO GROWTH
Special Requests: ADEQUATE

## 2024-04-17 LAB — BASIC METABOLIC PANEL WITH GFR
Anion gap: 11 (ref 5–15)
BUN: 16 mg/dL (ref 8–23)
CO2: 32 mmol/L (ref 22–32)
Calcium: 9 mg/dL (ref 8.9–10.3)
Chloride: 91 mmol/L — ABNORMAL LOW (ref 98–111)
Creatinine, Ser: 1.24 mg/dL (ref 0.61–1.24)
GFR, Estimated: >60 mL/min (ref >60)
Glucose, Bld: 108 mg/dL — ABNORMAL HIGH (ref 70–99)
Potassium: 3.2 mmol/L — ABNORMAL LOW (ref 3.5–5.1)
Sodium: 134 mmol/L — ABNORMAL LOW (ref 135–145)

## 2024-04-17 LAB — CBC
HCT: 35.3 % — ABNORMAL LOW (ref 39.0–52.0)
Hemoglobin: 10.9 g/dL — ABNORMAL LOW (ref 13.0–17.0)
MCH: 28.5 pg (ref 26.0–34.0)
MCHC: 30.9 g/dL (ref 30.0–36.0)
MCV: 92.2 fL (ref 80.0–100.0)
Platelets: 359 K/uL (ref 150–400)
RBC: 3.83 MIL/uL — ABNORMAL LOW (ref 4.22–5.81)
RDW: 15.2 % (ref 11.5–15.5)
WBC: 9.6 K/uL (ref 4.0–10.5)
nRBC: 0 % (ref 0.0–0.2)

## 2024-04-17 LAB — MAGNESIUM: Magnesium: 1.7 mg/dL (ref 1.7–2.4)

## 2024-04-17 MED ORDER — TRAMADOL HCL 50 MG PO TABS: 50.0000 mg | ORAL_TABLET | Freq: Three times a day (TID) | ORAL | 0 refills | Status: DC | PRN

## 2024-04-17 MED ORDER — OXYCODONE HCL 5 MG PO TABS: 5.0000 mg | ORAL_TABLET | Freq: Four times a day (QID) | ORAL | 0 refills | Status: DC | PRN

## 2024-04-17 MED ORDER — AMOXICILLIN-POT CLAVULANATE 875-125 MG PO TABS
1.0000 | ORAL_TABLET | Freq: Two times a day (BID) | ORAL | 0 refills | Status: AC
  Filled 2024-04-17: qty 8, 4d supply, fill #0

## 2024-04-17 MED ORDER — POTASSIUM CHLORIDE 10 MEQ/100ML IV SOLN
10.0000 meq | INTRAVENOUS | Status: AC
  Administered 2024-04-17 (×2): 10 meq via INTRAVENOUS
  Filled 2024-04-17 (×2): qty 100

## 2024-04-17 MED ORDER — POTASSIUM CHLORIDE CRYS ER 20 MEQ PO TBCR
40.0000 meq | EXTENDED_RELEASE_TABLET | Freq: Once | ORAL | Status: AC
  Administered 2024-04-17: 40 meq via ORAL
  Filled 2024-04-17: qty 2

## 2024-04-17 MED ORDER — TIZANIDINE HCL 4 MG PO TABS
4.0000 mg | ORAL_TABLET | Freq: Every evening | ORAL | 0 refills | Status: DC | PRN
Start: 2024-04-17 — End: 2024-06-05

## 2024-04-17 MED ORDER — GABAPENTIN 100 MG PO CAPS: 100.0000 mg | ORAL_CAPSULE | Freq: Every day | ORAL | Status: DC

## 2024-04-17 MED ORDER — ACETAMINOPHEN 325 MG PO TABS: 650.0000 mg | ORAL_TABLET | Freq: Four times a day (QID) | ORAL | Status: AC | PRN

## 2024-04-17 MED ORDER — DOCUSATE SODIUM 100 MG PO CAPS: 100.0000 mg | ORAL_CAPSULE | Freq: Two times a day (BID) | ORAL | Status: DC | PRN

## 2024-04-17 MED ORDER — MAGNESIUM SULFATE 2 GM/50ML IV SOLN
2.0000 g | Freq: Once | INTRAVENOUS | Status: AC
  Administered 2024-04-17: 2 g via INTRAVENOUS
  Filled 2024-04-17: qty 50

## 2024-04-17 NOTE — TOC Progression Note (Signed)
 Transition of Care [facility]) - Progression Note    Patient Details  Name: [name] MRN: [id number] Date of Birth: 03-04-1956  [facility] CM/SW Contact  [name], LCSWA Phone Number: 04/17/2024, 10:26 AM  Clinical Narrative:     Patient received insurance authorization approval for SNF. Plan Auth ID# [id number]. Auth ID# [id number]. Insurance authorization approved from 7/2-7/8. CSW spoke with Shona with [facility] who confirmed patient can dc over today if medically stable. CSW informed MD. CSW will continue to follow.  Expected Discharge Plan: Skilled Nursing Facility Barriers to Discharge: Continued Medical Work up  Expected Discharge Plan and Services In-house Referral: Clinical Social Work Discharge Planning Services: CM Consult Post Acute Care Choice: Skilled Nursing Facility Living arrangements for the past 2 months: Single Family Home                                       Social Determinants of Health (SDOH) Interventions SDOH Screenings   Food Insecurity: No Food Insecurity (04/13/2024)  Housing: Low Risk  (04/13/2024)  Recent Concern: Housing - High Risk (02/09/2024)  Transportation Needs: No Transportation Needs (04/13/2024)  Utilities: Not At Risk (04/13/2024)  Alcohol Screen: Low Risk  (09/16/2023)  Depression (PHQ2-9): Low Risk  (01/28/2024)  Financial Resource Strain: Medium Risk (01/01/2024)  Physical Activity: Inactive (01/01/2024)  Social Connections: Socially Isolated (04/13/2024)  Stress: No Stress Concern Present (01/01/2024)  Tobacco Use: Medium Risk (04/12/2024)    Readmission Risk Interventions    02/07/2024    2:21 PM 11/22/2022    1:34 PM  Readmission Risk Prevention Plan  Post Dischage Appt  Complete  Medication Screening  Complete  Transportation Screening Complete Complete  PCP or Specialist Appt within 3-5 Days Complete   HRI or Home Care Consult Complete   Social Work Consult for Recovery Care Planning/Counseling Complete    Palliative Care Screening Not Applicable   Medication Review Oceanographer) Referral to Pharmacy

## 2024-04-17 NOTE — TOC Transition Note (Signed)
 Transition of Care [facility]) - Discharge Note   Patient Details  Name: ANHAD SHEELEY MRN: [id number] Date of Birth: 04-26-1956  [facility]) CM/SW Contact:  [name], LCSWA Phone Number: 04/17/2024, 11:03 AM   Clinical Narrative:     Patient will DC to: [facility]   Anticipated DC date: 04/17/2024  Family notified: Melodie  Transport by: [facility]  ?  Per MD patient ready for DC to [facility] . RN, patient, patient's family, and facility notified of DC. Discharge Summary sent to facility. RN given number for report [phone] EXT: 0, RM# [phone]. DC packet on chart. Ambulance transport requested for patient.  CSW signing off.   Final next level of care: Skilled Nursing Facility Barriers to Discharge: No Barriers Identified   Patient Goals and CMS Choice Patient states their goals for this hospitalization and ongoing recovery are:: SNF CMS Medicare.gov Compare Post Acute Care list provided to:: Patient Choice offered to / list presented to : Patient      Discharge Placement              Patient [facility] at: [facility] Patient to be transferred to facility by: [facility] Name of family member notified: Melodie Patient and family notified of of transfer: 04/17/24  Discharge Plan and Services Additional resources added to the After Visit Summary for   In-house Referral: Clinical Social Work Discharge Planning Services: CM Consult Post Acute Care Choice: Skilled Nursing Facility                               Social Drivers of Health (SDOH) Interventions SDOH Screenings   Food Insecurity: No Food Insecurity (04/13/2024)  Housing: Low Risk  (04/13/2024)  Recent Concern: Housing - High Risk (02/09/2024)  Transportation Needs: No Transportation Needs (04/13/2024)  Utilities: Not At Risk (04/13/2024)  Alcohol Screen: Low Risk  (09/16/2023)  Depression (PHQ2-9): Low Risk  (01/28/2024)  Financial Resource Strain: Medium Risk  (01/01/2024)  Physical Activity: Inactive (01/01/2024)  Social Connections: Socially Isolated (04/13/2024)  Stress: No Stress Concern Present (01/01/2024)  Tobacco Use: Medium Risk (04/12/2024)     Readmission Risk Interventions    02/07/2024    2:21 PM 11/22/2022    1:34 PM  Readmission Risk Prevention Plan  Post Dischage Appt  Complete  Medication Screening  Complete  Transportation Screening Complete Complete  PCP or Specialist Appt within 3-5 Days Complete   HRI or Home Care Consult Complete   Social Work Consult for Recovery Care Planning/Counseling Complete   Palliative Care Screening Not Applicable   Medication Review Oceanographer) Referral to Pharmacy

## 2024-04-17 NOTE — Discharge Summary (Signed)
 Physician Discharge Summary  [name] FMW:[id number] DOB: October 08, 1956 DOA: 04/12/2024  PCP: Norleen Lynwood ORN, MD  Admit date: 04/12/2024 Discharge date: 04/17/2024  Admitted From: Home Disposition: SNF  Recommendations for Outpatient Follow-up:  Follow up with PCP in 1-2 weeks Please obtain BMP/CBC in one week your next doctors visit.  For now discontinue metoprolol  or losartan .  Can be resumed in the future if hemodynamically remained stable Reduced his tizanidine  dose Xanax , Soma  has been discontinued.  Ambien  stopped P.o. Augmentin  until 7/8.  Will need colonoscopy in 6-8 weeks Outpatient follow-up with [facility]   Discharge Condition: Stable CODE STATUS: Full code Diet recommendation: Heart healthy  Brief/Interim Summary: Brief Narrative:   68 year old man with history of CHF with EF 40%, chronic constipation, depression, HTN, fibromyalgia, GERD, paroxysmal A-fib on Eliquis , peripheral neuropathy chronic renal stones brought to the ED via concern for unintentional tizanidine  overdose with complaints of left lower quadrant abdominal pain found to have diverticulitis and low blood pressure convert septic shock related to diverticulitis versus polypharmacy.  Eventually patient was transferred out of the ICU.  Urine cultures grew ESBL, 10k colony therefore received 1 dose of fosfomycin.  Currently slowly improving on Zosyn  for his diverticulitis, EOT 7/8.  Also received electrolyte repletion during hospitalization.   Eventually patient was placed at SNF.  Assessment & Plan:  Principal Problem:   Septic shock (HCC) Active Problems:   Chronic HFrEF (heart failure with reduced ejection fraction) (HCC)   Diverticulitis of descending colon   Hypovolemic shock (HCC)   Accidental overdose    Concern for septic shock - Diverticulitis versus urinary tract infection versus hypovolemia versus drug overdose.  All of this is now improving.  No longer on vasopressor medications.  Blood  pressure stable.  Urine cultures growing ESBL and received 1 dose of fosfomycin in the hospital.  Hemodynamically now remained stable. -Now his losartan  and Lopressor  has been discontinued.  This can be readdressed outpatient   Hypovolemia -Proved with hydration  Hypokalemia - Repletion   Possible unintentional tizanidine  overdose -Unintentional, showing improvement.  Dose has been reduced   Acute diverticulitis of descending: -Continue Zosyn  > Augmentin , EOT 7/8 - Will need colonoscopy outpatient in 6-8 weeks  Atypical intermittent chest pain -Trop are neg, Nonischemic ekg  ESBL UTI Bilateral renal stones -Received a dose of fosfomycin, urine cultures growing 10K colonies.  We can add meropenem if spikes fevers -Flomax  -Eventually needs outpatient follow-up with urology.  Follows with at [facility] urology   Left nephrolithiasis (POA) - Likely contributing to patient's pain.  No hydroureter or hydronephrosis appreciated.  Tamsulosin  on board. Toradol    chronic HFrEF -Initially hypovolemic but now appears to be well resuscitated.  Torsemide  80 mg twice daily started.  For now due to hypotension is Lopressor  and losartan  has been discontinued.  This can be readdressed outpatient   COPD -Bronchodilators   Paroxysmal atrial fibrillation - Resuming Eliquis , eventually consider resuming Toprol .  Currently heart rate in 70s   Physical debilitation muscle weakness - PT OT SNF  TOC consulted.   DVT prophylaxis: apixaban  (ELIQUIS ) tablet 5 mg     Code Status: Full Code Family Communication:   Status is: Inpatient Remains inpatient appropriate because: snf placement   Subjective: Feeling better does not have any complaints today.  Examination:  General exam: Appears calm and comfortable, generally very weak and frail appearing Respiratory system: Clear to auscultation. Respiratory effort normal. Cardiovascular system: S1 & S2 heard, RRR. No JVD, murmurs, rubs, gallops or  clicks. No pedal edema. Gastrointestinal system: Abdomen is nondistended, soft and nontender. No organomegaly or masses felt. Normal bowel sounds heard. Central nervous system: Alert and oriented. No focal neurological deficits. Extremities: Symmetric4 x 5 power. Skin: No rashes, lesions or ulcers Psychiatry: Judgement and insight appear normal. Mood & affect appropriate.    Discharge Diagnoses:  Principal Problem:   Septic shock (HCC) Active Problems:   Chronic HFrEF (heart failure with reduced ejection fraction) (HCC)   Diverticulitis of descending colon   Hypovolemic shock (HCC)   Accidental overdose      Discharge Exam: Vitals:   04/17/24 0833 04/17/24 0839  BP:  132/68  Pulse: 98 70  Resp:    Temp:  98.6 F (37 C)  SpO2: 92% (!) 82%   Vitals:   04/17/24 0423 04/17/24 0808 04/17/24 0833 04/17/24 0839  BP: 127/69   132/68  Pulse: 72 72 98 70  Resp: 16 15    Temp: 98.1 F (36.7 C)   98.6 F (37 C)  TempSrc: Oral   Axillary  SpO2: 94%  92% (!) 82%  Weight: 106.7 kg     Height:          Discharge Instructions   Allergies as of 04/17/2024       Reactions   Amiodarone  Other (See Comments)   Caused hyperthyroidism   Statins Other (See Comments)   Myalgias   Wound Dressing Adhesive Other (See Comments)   Skin Tears Use Paper Tape Only        Medication List     STOP taking these medications    ALPRAZolam  0.5 MG tablet Commonly known as: XANAX    carisoprodol  350 MG tablet Commonly known as: Soma    fluconazole  100 MG tablet Commonly known as: DIFLUCAN    losartan  25 MG tablet Commonly known as: COZAAR    metoprolol  tartrate 25 MG tablet Commonly known as: LOPRESSOR    predniSONE  10 MG tablet Commonly known as: DELTASONE    zolpidem  5 MG tablet Commonly known as: AMBIEN        TAKE these medications    acetaminophen  325 MG tablet Commonly known as: TYLENOL  Take 2 tablets (650 mg total) by mouth every 6 (six) hours as needed for fever  or mild pain (pain score 1-3). What changed:  medication strength how much to take when to take this reasons to take this   albuterol  108 (90 Base) MCG/ACT inhaler Commonly known as: VENTOLIN  HFA INHALE 1-2 PUFFS BY MOUTH EVERY 6 HOURS AS NEEDED FOR WHEEZE OR SHORTNESS OF BREATH What changed: See the new instructions.   ammonium lactate  12 % cream Commonly known as: AMLACTIN Apply 1 Application topically as needed for dry skin.   amoxicillin -clavulanate 875-125 MG tablet Commonly known as: AUGMENTIN  Take 1 tablet by mouth 2 (two) times daily for 4 days.   apixaban  5 MG Tabs tablet Commonly known as: Eliquis  Take 1 tablet (5 mg total) by mouth 2 (two) times daily.   ascorbic acid 500 MG tablet Commonly known as: VITAMIN C Take 500 mg by mouth daily.   augmented betamethasone  dipropionate 0.05 % cream Commonly known as: DIPROLENE -AF Apply 1 application  topically daily as needed (to affected areas- for itching).   docusate sodium  100 MG capsule Commonly known as: COLACE Take 1 capsule (100 mg total) by mouth 2 (two) times daily as needed for mild constipation.   doxepin  10 MG capsule Commonly known as: SINEQUAN  Take 2 capsules (20 mg total) by mouth at bedtime.   DULoxetine  60 MG  capsule Commonly known as: CYMBALTA  TAKE 1 CAPSULE BY MOUTH EVERY DAY   Dupixent 300 MG/2ML Soaj Generic drug: Dupilumab Inject 300 mg into the skin every 14 (fourteen) days.   fluticasone  50 MCG/ACT nasal spray Commonly known as: FLONASE  Place 2 sprays into both nostrils daily as needed for allergies or rhinitis.   gabapentin  100 MG capsule Commonly known as: NEURONTIN  Take 1 capsule (100 mg total) by mouth at bedtime. What changed: when to take this   ipratropium-albuterol  0.5-2.5 (3) MG/3ML Soln Commonly known as: DUONEB TAKE 3 ML BY NEBULIZATION EVERY 4 HOURS AS NEEDED (WHEEZING). What changed: See the new instructions.   leptospermum manuka honey Pste paste Apply 1  Application topically daily.   lidocaine  5 % Commonly known as: Lidoderm  Place 1 patch onto the skin daily. Remove & Discard patch within 12 hours or as directed by MD   linaclotide  145 MCG Caps capsule Commonly known as: Linzess  Take 1 capsule (145 mcg total) by mouth daily before breakfast. What changed:  when to take this reasons to take this   omeprazole  20 MG capsule Commonly known as: PRILOSEC TAKE 1 CAPSULE BY MOUTH TWICE A DAY What changed: when to take this   oxyCODONE  5 MG immediate release tablet Commonly known as: Roxicodone  Take 1 tablet (5 mg total) by mouth every 6 (six) hours as needed for severe pain (pain score 7-10).   potassium chloride  SA 20 MEQ tablet Commonly known as: KLOR-CON  M Take 1 tablet (20 mEq total) by mouth daily.   Repatha  SureClick 140 MG/ML Soaj Generic drug: Evolocumab  INJECT 140 MG INTO THE SKIN EVERY 14 DAYS What changed: See the new instructions.   SENIOR VITES PO Take 1 tablet by mouth daily with breakfast.   Spiriva  Respimat 2.5 MCG/ACT Aers Generic drug: Tiotropium Bromide  Monohydrate Take 2 puffs by nebulization daily. What changed: how to take this   tamsulosin  0.4 MG Caps capsule Commonly known as: FLOMAX  Take 1 capsule (0.4 mg total) by mouth daily.   tirzepatide  7.5 MG/0.5ML Pen Commonly known as: MOUNJARO  Inject 7.5 mg into the skin once a week.   tiZANidine  4 MG tablet Commonly known as: ZANAFLEX  Take 1 tablet (4 mg total) by mouth at bedtime as needed for muscle spasms. What changed:  how much to take when to take this reasons to take this   torsemide  20 MG tablet Commonly known as: DEMADEX  Take 3 tablets (60 mg total) by mouth 2 (two) times daily. What changed: how much to take   traMADol  50 MG tablet Commonly known as: ULTRAM  Take 1 tablet (50 mg total) by mouth every 8 (eight) hours as needed for moderate pain (pain score 4-6). What changed: reasons to take this   Tums 500 MG chewable  tablet Generic drug: calcium  carbonate Chew 1 tablet by mouth 2 (two) times daily as needed for indigestion or heartburn.   venlafaxine  XR 150 MG 24 hr capsule Commonly known as: EFFEXOR -XR TAKE 1 CAPSULE BY MOUTH DAILY WITH BREAKFAST.   Vitamin B-12 2500 MCG Subl Place 2,500 mcg under the tongue daily.   Vitamin D3 50 MCG (2000 UT) Tabs Take 2,000 Units by mouth daily.   Wixela Inhub 250-50 MCG/ACT Aepb Generic drug: fluticasone -salmeterol INHALE 1 PUFF INTO THE LUNGS IN THE MORNING AND AT BEDTIME. What changed:  when to take this additional instructions        Contact information for follow-up providers     Manny, Ricardo KATHEE Raddle., MD .   Specialty: Urology  Why: As needed Contact information: [address] [place] [address] [phone]         Norleen Lynwood ORN, MD Follow up in 1 week(s).   Specialties: Internal Medicine, Radiology Contact information: [address] [place] [address] [phone]              Contact information for after-discharge care     Destination     [facility]   Service: Skilled Nursing Contact information: [address] [place]  [address] [phone]                    Allergies  Allergen Reactions   Amiodarone  Other (See Comments)    Caused hyperthyroidism    Statins Other (See Comments)    Myalgias    Wound Dressing Adhesive Other (See Comments)    Skin Tears Use Paper Tape Only    You were cared for by a hospitalist during your hospital stay. If you have any questions about your discharge medications or the care you received while you were in the hospital after you are discharged, you can call the unit and asked to speak with the hospitalist on call if the hospitalist that took care of you is not available. Once you are discharged, your primary care physician will handle any further medical issues. Please note that no refills for any discharge  medications will be authorized once you are discharged, as it is imperative that you return to your primary care physician (or establish a relationship with a primary care physician if you do not have one) for your aftercare needs so that they can reassess your need for medications and monitor your lab values.  You were cared for by a hospitalist during your hospital stay. If you have any questions about your discharge medications or the care you received while you were in the hospital after you are discharged, you can call the unit and asked to speak with the hospitalist on call if the hospitalist that took care of you is not available. Once you are discharged, your primary care physician will handle any further medical issues. Please note that NO REFILLS for any discharge medications will be authorized once you are discharged, as it is imperative that you return to your primary care physician (or establish a relationship with a primary care physician if you do not have one) for your aftercare needs so that they can reassess your need for medications and monitor your lab values.  Please request your Prim.MD to go over all Hospital Tests and Procedure/Radiological results at the follow up, please get all Hospital records sent to your Prim MD by signing hospital release before you go home.  Get CBC, CMP, 2 view Chest X ray checked  by Primary MD during your next visit or SNF MD in 5-7 days ( we routinely change or add medications that can affect your baseline labs and fluid status, therefore we recommend that you get the mentioned basic workup next visit with your PCP, your PCP may decide not to get them or add new tests based on their clinical decision)  On your next visit with your primary care physician please Get Medicines reviewed and adjusted.  If you experience worsening of your admission symptoms, develop shortness of breath, life threatening emergency, suicidal or homicidal thoughts you must seek  medical attention immediately by calling 911 or calling your MD immediately  if symptoms less severe.  You Must read complete instructions/literature  along with all the possible adverse reactions/side effects for all the Medicines you take and that have been prescribed to you. Take any new Medicines after you have completely understood and accpet all the possible adverse reactions/side effects.   Do not drive, operate heavy machinery, perform activities at heights, swimming or participation in water activities or provide baby sitting services if your were admitted for syncope or siezures until you have seen by Primary MD or a Neurologist and advised to do so again.  Do not drive when taking Pain medications.   Procedures/Studies: DG CHEST PORT 1 VIEW Result Date: 04/16/2024 CLINICAL DATA:  68 year old male with shortness of breath and chest pain. EXAM: PORTABLE CHEST 1 VIEW COMPARISON:  Portable chest 02/06/2024 and earlier. FINDINGS: Portable AP semi upright view at 0639 hours. Improved lung volumes and some improved ventilation since April. Stable cardiac size and mediastinal contours. Chronic left chest AICD. No pneumothorax. No pulmonary edema. Evidence of ongoing small to moderate left layering pleural effusion with lung base opacity most likely atelectasis. Chronic right upper lobe peribronchial streaky opacity probably atelectasis or scarring, similar to a 2021 CTA chest. No overt edema. No pneumothorax. Paucity of bowel gas. Stable visualized osseous structures. IMPRESSION: 1. Improved lung volumes and ventilation since April but ongoing left lung base opacity favored to be small or moderate left pleural effusion with atelectasis. 2. Chronic right upper lobe suspected atelectasis or scarring. Electronically Signed   By: VEAR Hurst M.D.   On: 04/16/2024 07:00   US  EKG SITE RITE Result Date: 04/12/2024 If Site Rite image not attached, placement could not be confirmed due to current cardiac  rhythm.  CT ABDOMEN PELVIS W CONTRAST Result Date: 04/12/2024 CLINICAL DATA:  Left lower quadrant abdominal pain. EXAM: CT ABDOMEN AND PELVIS WITH CONTRAST TECHNIQUE: Multidetector CT imaging of the abdomen and pelvis was performed using the standard protocol following bolus administration of intravenous contrast. RADIATION DOSE REDUCTION: This exam was performed according to the departmental dose-optimization program which includes automated exposure control, adjustment of the mA and/or kV according to patient size and/or use of iterative reconstruction technique. CONTRAST:  75mL OMNIPAQUE  IOHEXOL  350 MG/ML SOLN COMPARISON:  01/24/2024. FINDINGS: Lower chest: Atelectasis/consolidation noted involving the left lower lobe. Atelectasis versus scar noted within the lingula and posterior right lower lobe. No pleural effusion identified Hepatobiliary: No focal liver abnormality is seen. No gallstones, gallbladder wall thickening, or biliary dilatation. Pancreas: Unremarkable. No pancreatic ductal dilatation or surrounding inflammatory changes. Spleen: Normal in size without focal abnormality. Adrenals/Urinary Tract: Normal adrenal glands. No hydronephrosis. Inferior pole left kidney cyst measures 3.7 cm, image 42/3. No follow-up imaging recommended. Bilateral kidney stones. The largest is in the inferior pole of left kidney measuring 0.9 cm. Unchanged mild bilateral perinephric soft tissue stranding. Bladder normal. Within the distal left ureter there is a stone measuring 5 mm, image 78/3. New from previous exam. This does not result in significant left-sided hydronephrosis or hydroureter. Stomach/Bowel: Stomach normal. No pathologic dilatation of the large or small bowel loops. The appendix is visualized and appears normal. Colonic diverticulosis. Most severe within the sigmoid colon. Signs of acute diverticulitis involving the distal descending colon with wall thickening and surrounding soft tissue stranding, image  63/3. No signs of perforation or abscess. Vascular/Lymphatic: Aortic atherosclerosis. No aneurysm. No signs of abdominopelvic adenopathy. Reproductive: Prostate is unremarkable. Other: No free fluid or fluid collections.  No free air. Musculoskeletal: No acute or significant osseous findings. Remote right posterior and lateral tenth rib fracture. Inferior  endplate deformity involving the T11 vertebra with interval increase and sclerosis along the endplate compared with 01/24/2024. There is new mild superior endplate compression deformity involving the T12 vertebral with increased sclerosis along the endplate. Lumbar spondylosis. IMPRESSION: 1. Signs of acute diverticulitis involving the distal descending colon with wall thickening and surrounding soft tissue stranding. No signs of perforation or abscess. 2. There is a 5 mm stone within the distal left ureter which does not result in significant left-sided hydronephrosis or hydroureter. 3. Bilateral kidney stones. 4. Atelectasis/consolidation noted involving the left lower lobe. 5. Inferior endplate deformity involving the T11 vertebra with interval increase and sclerosis along the endplate compared with 01/24/2024. 6. There is new, age indeterminate mild superior endplate compression deformity involving the T12 vertebral with increased sclerosis along the superior endplate. 7.  Aortic Atherosclerosis (ICD10-I70.0). Electronically Signed   By: Waddell Calk M.D.   On: 04/12/2024 06:10     The results of significant diagnostics from this hospitalization (including imaging, microbiology, ancillary and laboratory) are listed below for reference.     Microbiology: Recent Results (from the past 240 hours)  Blood culture (routine x 2)     Status: None   Collection Time: 04/12/24  7:26 AM   Specimen: BLOOD  Result Value Ref Range Status   Specimen Description BLOOD SITE NOT SPECIFIED  Final   Special Requests   Final    BOTTLES DRAWN AEROBIC ONLY Blood  Culture results may not be optimal due to an inadequate volume of blood received in culture bottles   Culture   Final    NO GROWTH 5 DAYS Performed at [facility], [address], [place], [place] [address]    Report Status 04/17/2024 FINAL  Final  Blood culture (routine x 2)     Status: None   Collection Time: 04/12/24  7:31 AM   Specimen: BLOOD  Result Value Ref Range Status   Specimen Description BLOOD LEFT ANTECUBITAL  Final   Special Requests   Final    BOTTLES DRAWN AEROBIC AND ANAEROBIC Blood Culture adequate volume   Culture   Final    NO GROWTH 5 DAYS Performed at [facility], [address] [place], [place] [address]    Report Status 04/17/2024 FINAL  Final  MRSA Next Gen by PCR, Nasal     Status: Abnormal   Collection Time: 04/12/24  8:21 AM   Specimen: Nasal Mucosa; Nasal Swab  Result Value Ref Range Status   MRSA by PCR Next Gen DETECTED (A) NOT DETECTED Final    Comment: RESULT CALLED TO, READ BACK BY AND VERIFIED WITH: RN OLE PIETY [id number] BY J RAZZAK,MT (NOTE) The GeneXpert MRSA Assay (FDA approved for NASAL specimens only), is one component of a comprehensive MRSA colonization surveillance program. It is not intended to diagnose MRSA infection nor to guide or monitor treatment for MRSA infections. Test performance is not FDA approved in patients less than 10 years old. Performed at [facility], [address] [place], [place] [address]   Urine Culture     Status: Abnormal   Collection Time: 04/12/24  5:44 PM   Specimen: Urine, Clean Catch  Result Value Ref Range Status   Specimen Description URINE, CLEAN CATCH  Final   Special Requests   Final    NONE Performed at [facility], [address], [place], [place] [address]    Culture (A)  Final    10,000 COLONIES/mL ESCHERICHIA COLI  Confirmed Extended Spectrum Beta-Lactamase Producer (ESBL).  In bloodstream infections from ESBL organisms, carbapenems are preferred  over piperacillin /tazobactam. They are shown to have a lower risk of mortality.    Report Status 04/14/2024 FINAL  Final   Organism ID, Bacteria ESCHERICHIA COLI (A)  Final      Susceptibility   Escherichia coli - MIC*    AMPICILLIN >=32 RESISTANT Resistant     CEFAZOLIN  >=64 RESISTANT Resistant     CEFEPIME  >=32 RESISTANT Resistant     CEFTRIAXONE  >=64 RESISTANT Resistant     CIPROFLOXACIN  >=4 RESISTANT Resistant     GENTAMICIN  <=1 SENSITIVE Sensitive     IMIPENEM <=0.25 SENSITIVE Sensitive     NITROFURANTOIN <=16 SENSITIVE Sensitive     TRIMETH /SULFA  <=20 SENSITIVE Sensitive     AMPICILLIN/SULBACTAM >=32 RESISTANT Resistant     PIP/TAZO >=128 RESISTANT Resistant ug/mL    * 10,000 COLONIES/mL ESCHERICHIA COLI     Labs: BNP (last 3 results) Recent Labs    11/04/23 1528 01/24/24 1307 02/03/24 1615  BNP 41.4 33.7 56.2   Basic Metabolic Panel: Recent Labs  Lab 04/13/24 0448 04/14/24 0449 04/15/24 0354 04/16/24 0411 04/17/24 0422  NA 133* 137 136 136 134*  K 4.5 3.7 3.5 3.2* 3.2*  CL 96* 92* 92* 89* 91*  CO2 29 33* 31 35* 32  GLUCOSE 92 84 101* 99 108*  BUN 17 15 13 11 16   CREATININE 1.05 1.03 1.07 0.93 1.24  CALCIUM  9.4 9.2 8.7* 9.1 9.0  MG 2.3 1.8 1.8 1.8 1.7  PHOS 4.1  --   --   --   --    Liver Function Tests: Recent Labs  Lab 04/12/24 0414 04/14/24 0449  AST 67* 30  ALT 74* 49*  ALKPHOS 78 87  BILITOT 0.9 1.1  PROT 5.7* 6.7  ALBUMIN  2.8* 2.9*   Recent Labs  Lab 04/12/24 0414  LIPASE 40   No results for input(s): AMMONIA in the last 168 hours. CBC: Recent Labs  Lab 04/12/24 0414 04/13/24 0448 04/14/24 0449 04/15/24 0354 04/16/24 0411 04/17/24 0422  WBC 8.6 9.0 9.9 8.8 7.5 9.6  NEUTROABS 6.0  --   --   --   --   --   HGB 10.3* 11.4* 11.4* 11.2* 10.9* 10.9*  HCT 34.2* 37.8* 37.5* 37.2* 35.5* 35.3*  MCV 94.7 96.2 95.4 93.9 93.7 92.2  PLT 296 310 362 347 347 359   Cardiac Enzymes: No results for input(s): CKTOTAL, CKMB, CKMBINDEX,  TROPONINI in the last 168 hours. BNP: Invalid input(s): POCBNP CBG: Recent Labs  Lab 04/14/24 1708 04/14/24 2138 04/15/24 0804 04/15/24 1132 04/15/24 1607  GLUCAP 109* 109* 113* 118* 101*   D-Dimer No results for input(s): DDIMER in the last 72 hours. Hgb A1c No results for input(s): HGBA1C in the last 72 hours. Lipid Profile No results for input(s): CHOL, HDL, LDLCALC, TRIG, CHOLHDL, LDLDIRECT in the last 72 hours. Thyroid  function studies No results for input(s): TSH, T4TOTAL, T3FREE, THYROIDAB in the last 72 hours.  Invalid input(s): FREET3 Anemia work up No results for input(s): VITAMINB12, FOLATE, FERRITIN, TIBC, IRON, RETICCTPCT in the last 72 hours. Urinalysis    Component Value Date/Time   COLORURINE YELLOW 04/12/2024 1744   APPEARANCEUR HAZY (A) 04/12/2024 1744   LABSPEC >1.046 (H) 04/12/2024 1744   PHURINE 5.0 04/12/2024 1744   GLUCOSEU NEGATIVE 04/12/2024 1744   GLUCOSEU NEGATIVE 01/07/2024 1546   HGBUR NEGATIVE 04/12/2024 1744   BILIRUBINUR NEGATIVE 04/12/2024 1744   KETONESUR NEGATIVE 04/12/2024 1744  PROTEINUR 30 (A) 04/12/2024 1744   UROBILINOGEN 0.2 01/07/2024 1546   NITRITE NEGATIVE 04/12/2024 1744   LEUKOCYTESUR NEGATIVE 04/12/2024 1744   Sepsis Labs Recent Labs  Lab 04/14/24 0449 04/15/24 0354 04/16/24 0411 04/17/24 0422  WBC 9.9 8.8 7.5 9.6   Microbiology Recent Results (from the past 240 hours)  Blood culture (routine x 2)     Status: None   Collection Time: 04/12/24  7:26 AM   Specimen: BLOOD  Result Value Ref Range Status   Specimen Description BLOOD SITE NOT SPECIFIED  Final   Special Requests   Final    BOTTLES DRAWN AEROBIC ONLY Blood Culture results may not be optimal due to an inadequate volume of blood received in culture bottles   Culture   Final    NO GROWTH 5 DAYS Performed at [facility], [address] [place], [place] [address]    Report Status 04/17/2024 FINAL  Final   Blood culture (routine x 2)     Status: None   Collection Time: 04/12/24  7:31 AM   Specimen: BLOOD  Result Value Ref Range Status   Specimen Description BLOOD LEFT ANTECUBITAL  Final   Special Requests   Final    BOTTLES DRAWN AEROBIC AND ANAEROBIC Blood Culture adequate volume   Culture   Final    NO GROWTH 5 DAYS Performed at [facility], [address], [place], [place] [address]    Report Status 04/17/2024 FINAL  Final  MRSA Next Gen by PCR, Nasal     Status: Abnormal   Collection Time: 04/12/24  8:21 AM   Specimen: Nasal Mucosa; Nasal Swab  Result Value Ref Range Status   MRSA by PCR Next Gen DETECTED (A) NOT DETECTED Final    Comment: RESULT CALLED TO, READ BACK BY AND VERIFIED WITH: RN OLE PIETY [id number] BY J RAZZAK,MT (NOTE) The GeneXpert MRSA Assay (FDA approved for NASAL specimens only), is one component of a comprehensive MRSA colonization surveillance program. It is not intended to diagnose MRSA infection nor to guide or monitor treatment for MRSA infections. Test performance is not FDA approved in patients less than 59 years old. Performed at [facility], [address] [place], [place] [address]   Urine Culture     Status: Abnormal   Collection Time: 04/12/24  5:44 PM   Specimen: Urine, Clean Catch  Result Value Ref Range Status   Specimen Description URINE, CLEAN CATCH  Final   Special Requests   Final    NONE Performed at [facility], [address], [place], [place] [address]    Culture (A)  Final    10,000 COLONIES/mL ESCHERICHIA COLI Confirmed Extended Spectrum Beta-Lactamase Producer (ESBL).  In bloodstream infections from ESBL organisms, carbapenems are preferred over piperacillin /tazobactam. They are shown to have a lower risk of mortality.    Report Status 04/14/2024 FINAL  Final   Organism ID, Bacteria ESCHERICHIA COLI (A)  Final      Susceptibility   Escherichia coli - MIC*    AMPICILLIN >=32 RESISTANT  Resistant     CEFAZOLIN  >=64 RESISTANT Resistant     CEFEPIME  >=32 RESISTANT Resistant     CEFTRIAXONE  >=64 RESISTANT Resistant     CIPROFLOXACIN  >=4 RESISTANT Resistant     GENTAMICIN  <=1 SENSITIVE Sensitive     IMIPENEM <=0.25 SENSITIVE Sensitive     NITROFURANTOIN <=16 SENSITIVE Sensitive     TRIMETH /SULFA  <=20 SENSITIVE Sensitive     AMPICILLIN/SULBACTAM >=  32 RESISTANT Resistant     PIP/TAZO >=128 RESISTANT Resistant ug/mL    * 10,000 COLONIES/mL ESCHERICHIA COLI     Time coordinating discharge:  I have spent 35 minutes face to face with the patient and on the ward discussing the patients care, assessment, plan and disposition with other care givers. >50% of the time was devoted counseling the patient about the risks and benefits of treatment/Discharge disposition and coordinating care.   SIGNED:   Burgess JAYSON Dare, MD  [facility] 04/17/2024, 10:47 AM   If 7PM-7AM, please contact night-coverage

## 2024-04-18 ENCOUNTER — Other Ambulatory Visit (HOSPITAL_COMMUNITY): Payer: Self-pay

## 2024-04-20 DIAGNOSIS — M797 Fibromyalgia: Secondary | ICD-10-CM | POA: Diagnosis not present

## 2024-04-20 DIAGNOSIS — I480 Paroxysmal atrial fibrillation: Secondary | ICD-10-CM | POA: Diagnosis not present

## 2024-04-20 DIAGNOSIS — K219 Gastro-esophageal reflux disease without esophagitis: Secondary | ICD-10-CM | POA: Diagnosis not present

## 2024-04-20 DIAGNOSIS — E119 Type 2 diabetes mellitus without complications: Secondary | ICD-10-CM | POA: Diagnosis not present

## 2024-04-20 DIAGNOSIS — I10 Essential (primary) hypertension: Secondary | ICD-10-CM | POA: Diagnosis not present

## 2024-04-20 DIAGNOSIS — K5904 Chronic idiopathic constipation: Secondary | ICD-10-CM | POA: Diagnosis not present

## 2024-04-20 DIAGNOSIS — K5792 Diverticulitis of intestine, part unspecified, without perforation or abscess without bleeding: Secondary | ICD-10-CM | POA: Diagnosis not present

## 2024-04-20 DIAGNOSIS — J449 Chronic obstructive pulmonary disease, unspecified: Secondary | ICD-10-CM | POA: Diagnosis not present

## 2024-04-20 DIAGNOSIS — F32A Depression, unspecified: Secondary | ICD-10-CM | POA: Diagnosis not present

## 2024-04-21 DIAGNOSIS — I10 Essential (primary) hypertension: Secondary | ICD-10-CM | POA: Diagnosis not present

## 2024-04-21 DIAGNOSIS — L89890 Pressure ulcer of other site, unstageable: Secondary | ICD-10-CM | POA: Diagnosis not present

## 2024-04-21 DIAGNOSIS — J449 Chronic obstructive pulmonary disease, unspecified: Secondary | ICD-10-CM | POA: Diagnosis not present

## 2024-04-21 DIAGNOSIS — K5904 Chronic idiopathic constipation: Secondary | ICD-10-CM | POA: Diagnosis not present

## 2024-04-21 DIAGNOSIS — E119 Type 2 diabetes mellitus without complications: Secondary | ICD-10-CM | POA: Diagnosis not present

## 2024-04-21 DIAGNOSIS — F32A Depression, unspecified: Secondary | ICD-10-CM | POA: Diagnosis not present

## 2024-04-21 DIAGNOSIS — K219 Gastro-esophageal reflux disease without esophagitis: Secondary | ICD-10-CM | POA: Diagnosis not present

## 2024-04-21 DIAGNOSIS — E1140 Type 2 diabetes mellitus with diabetic neuropathy, unspecified: Secondary | ICD-10-CM | POA: Diagnosis not present

## 2024-04-21 DIAGNOSIS — K5792 Diverticulitis of intestine, part unspecified, without perforation or abscess without bleeding: Secondary | ICD-10-CM | POA: Diagnosis not present

## 2024-04-21 DIAGNOSIS — M797 Fibromyalgia: Secondary | ICD-10-CM | POA: Diagnosis not present

## 2024-04-21 DIAGNOSIS — I480 Paroxysmal atrial fibrillation: Secondary | ICD-10-CM | POA: Diagnosis not present

## 2024-04-21 DIAGNOSIS — K5732 Diverticulitis of large intestine without perforation or abscess without bleeding: Secondary | ICD-10-CM | POA: Diagnosis not present

## 2024-04-22 DIAGNOSIS — K219 Gastro-esophageal reflux disease without esophagitis: Secondary | ICD-10-CM | POA: Diagnosis not present

## 2024-04-22 DIAGNOSIS — J449 Chronic obstructive pulmonary disease, unspecified: Secondary | ICD-10-CM | POA: Diagnosis not present

## 2024-04-22 DIAGNOSIS — I10 Essential (primary) hypertension: Secondary | ICD-10-CM | POA: Diagnosis not present

## 2024-04-22 DIAGNOSIS — E119 Type 2 diabetes mellitus without complications: Secondary | ICD-10-CM | POA: Diagnosis not present

## 2024-04-22 DIAGNOSIS — I480 Paroxysmal atrial fibrillation: Secondary | ICD-10-CM | POA: Diagnosis not present

## 2024-04-22 DIAGNOSIS — M797 Fibromyalgia: Secondary | ICD-10-CM | POA: Diagnosis not present

## 2024-04-22 DIAGNOSIS — F32A Depression, unspecified: Secondary | ICD-10-CM | POA: Diagnosis not present

## 2024-04-22 DIAGNOSIS — K5792 Diverticulitis of intestine, part unspecified, without perforation or abscess without bleeding: Secondary | ICD-10-CM | POA: Diagnosis not present

## 2024-04-22 DIAGNOSIS — K5904 Chronic idiopathic constipation: Secondary | ICD-10-CM | POA: Diagnosis not present

## 2024-04-23 ENCOUNTER — Encounter: Payer: Self-pay | Admitting: Gastroenterology

## 2024-04-23 ENCOUNTER — Ambulatory Visit: Admitting: Gastroenterology

## 2024-04-23 VITALS — BP 94/60 | HR 75 | Ht 70.0 in | Wt 245.0 lb

## 2024-04-23 DIAGNOSIS — R1312 Dysphagia, oropharyngeal phase: Secondary | ICD-10-CM | POA: Diagnosis not present

## 2024-04-23 DIAGNOSIS — I480 Paroxysmal atrial fibrillation: Secondary | ICD-10-CM | POA: Diagnosis not present

## 2024-04-23 DIAGNOSIS — K219 Gastro-esophageal reflux disease without esophagitis: Secondary | ICD-10-CM

## 2024-04-23 DIAGNOSIS — K5904 Chronic idiopathic constipation: Secondary | ICD-10-CM | POA: Diagnosis not present

## 2024-04-23 DIAGNOSIS — J449 Chronic obstructive pulmonary disease, unspecified: Secondary | ICD-10-CM | POA: Diagnosis not present

## 2024-04-23 DIAGNOSIS — Z8719 Personal history of other diseases of the digestive system: Secondary | ICD-10-CM | POA: Diagnosis not present

## 2024-04-23 DIAGNOSIS — R1319 Other dysphagia: Secondary | ICD-10-CM

## 2024-04-23 DIAGNOSIS — F32A Depression, unspecified: Secondary | ICD-10-CM | POA: Diagnosis not present

## 2024-04-23 DIAGNOSIS — M797 Fibromyalgia: Secondary | ICD-10-CM | POA: Diagnosis not present

## 2024-04-23 DIAGNOSIS — K5792 Diverticulitis of intestine, part unspecified, without perforation or abscess without bleeding: Secondary | ICD-10-CM | POA: Diagnosis not present

## 2024-04-23 DIAGNOSIS — R143 Flatulence: Secondary | ICD-10-CM

## 2024-04-23 DIAGNOSIS — Z860100 Personal history of colon polyps, unspecified: Secondary | ICD-10-CM

## 2024-04-23 DIAGNOSIS — K5909 Other constipation: Secondary | ICD-10-CM | POA: Diagnosis not present

## 2024-04-23 DIAGNOSIS — E119 Type 2 diabetes mellitus without complications: Secondary | ICD-10-CM | POA: Diagnosis not present

## 2024-04-23 DIAGNOSIS — I10 Essential (primary) hypertension: Secondary | ICD-10-CM | POA: Diagnosis not present

## 2024-04-23 MED ORDER — MILK OF MAGNESIA 7.75 % PO SUSP
5.0000 mL | Freq: Every day | ORAL | 0 refills | Status: AC | PRN
Start: 2024-04-23 — End: ?

## 2024-04-23 MED ORDER — ESOMEPRAZOLE MAGNESIUM 40 MG PO CPDR
40.0000 mg | DELAYED_RELEASE_CAPSULE | Freq: Every day | ORAL | 2 refills | Status: AC
Start: 2024-04-23 — End: ?

## 2024-04-23 MED ORDER — LINACLOTIDE 145 MCG PO CAPS: 145.0000 ug | ORAL_CAPSULE | Freq: Every day | ORAL | 3 refills | Status: DC

## 2024-04-23 MED ORDER — BISACODYL EC 5 MG PO TBEC: 5.0000 mg | DELAYED_RELEASE_TABLET | Freq: Every day | ORAL | 0 refills | Status: DC | PRN

## 2024-04-23 NOTE — Patient Instructions (Addendum)
 You have been scheduled for a Barium Esophogram at [facility] (1st floor of the hospital) on 04/01/24 at 10:00am. Please arrive 30 minutes prior to your appointment for registration. Make certain not to have anything to eat or drink 3 hours prior to your test. If you need to reschedule for any reason, please contact radiology at [phone] to do so. __________________________________________________________________ A barium swallow is an examination that concentrates on views of the esophagus. This tends to be a double contrast exam (barium and two liquids which, when combined, create a gas to distend the wall of the oesophagus) or single contrast (non-ionic iodine  based). The study is usually tailored to your symptoms so a good history is essential. Attention is paid during the study to the form, structure and configuration of the esophagus, looking for functional disorders (such as aspiration, dysphagia, achalasia, motility and reflux) EXAMINATION You may be asked to change into a gown, depending on the type of swallow being performed. A radiologist and radiographer will perform the procedure. The radiologist will advise you of the type of contrast selected for your procedure and direct you during the exam. You will be asked to stand, sit or lie in several different positions and to hold a small amount of fluid in your mouth before being asked to swallow while the imaging is performed .In some instances you may be asked to swallow barium coated marshmallows to assess the motility of a solid food bolus. The exam can be recorded as a digital or video fluoroscopy procedure. POST PROCEDURE It will take 1-2 days for the barium to pass through your system. To facilitate this, it is important, unless otherwise directed, to increase your fluids for the next 24-48hrs and to resume your normal diet.  This test typically takes about 30 minutes to  perform. __________________________________________________________________________________  _______________________________________________________  If your blood pressure at your visit was 140/90 or greater, please contact your primary care physician to follow up on this.  _______________________________________________________  If you are age 69 or older, your body mass index should be between 23-30. Your Body mass index is 35.15 kg/m. If this is out of the aforementioned range listed, please consider follow up with your Primary Care Provider.  If you are age 73 or younger, your body mass index should be between 19-25. Your Body mass index is 35.15 kg/m. If this is out of the aformentioned range listed, please consider follow up with your Primary Care Provider.   ________________________________________________________  The [facility] providers would like to encourage you to use MYCHART to communicate with providers for non-urgent requests or questions.  Due to long hold times on the telephone, sending your provider a message by [facility] may be a faster and more efficient way to get a response.  Please allow 48 business hours for a response.  Please remember that this is for non-urgent requests.  _______________________________________________________  Thank you for trusting me with your gastrointestinal care. Deanna May, RNP

## 2024-04-23 NOTE — Progress Notes (Signed)
 Chief Complaint:dysphagia, incontinence of feces Primary GI Doctor:( Dr. Aneita) Dr. Charlanne  HPI:  Patient is a  68  year old male patient with past medical history of COPD,CHF with EF 40%, GERD, hypertension, diabetes type 2, atrial fibrillation (on eliquis ), who was referred to me by Norleen Lynwood ORN, MD on 01/03/24 for a complaint of dysphagia, incontinence of feces. Patient last seen in GI office by Delon, PA on 06/07/2021.  04/12/2024 patient recently discharged from hospital for septic shock related to diverticulitis versus polypharmacy. Urine cultures grew ESBL, 10k colony therefore received 1 dose of fosfomycin.  Currently slowly improving on Zosyn  for his diverticulitis, EOT 7/8.  Also received electrolyte repletion during hospitalization.   Eventually patient was placed at SNF.  Interval History    Patient presents with main complaint of dysphagia and constipation.  He is currently at [facility].  Patient has history of GERD and currently taking Esomeprazole  20 mg po daily. Patient complains of intermittent oropharyngeal dysphagia with solids and liquids for the past several months. Denies history of of stroke.  Patient was started on Mounjaro  a few months ago. He reports this has decreased his appetite. He has had daily nausea, No vomiting. Patient has lost 25lbs since Alaine Loughney. He also tells me the food at the rehab is not that good so he does not eat much.  He is undergoing rehab and hopes to go back home where his girlfriend cares for him.  Patient is wheelchair-bound and cannot transfer.    Patient also notes he has altered bowel habits with odorous flatulence. Patient reports he has bowel movement every 2-3 days. He will ask for Linzess  145 mcg but has only been taking as needed.  He was not aware this was a daily medication. Patient uses diaper for bowel movements, he is unsure of stool consistency. No blood in stool. He has a lot of bloating.   Patient on N/C on 2L  continuous, titrate with activity.  No alcohol use. Nonsmoker.   Patient taking Eliquis  5 mg po twice daily.  Wt Readings from Last 3 Encounters:  04/23/24 245 lb (111.1 kg)  04/17/24 235 lb 3.7 oz (106.7 kg)  02/13/24 270 lb 1 oz (122.5 kg)    Past Medical History:  Diagnosis Date   AICD (automatic cardioverter/defibrillator) present    Anemia    supposed to be taking Vit B but doesn't   ANXIETY    takes Xanax  nightly   Arthritis    Asthma    Albuterol  prn and Advair  daily;also takes Prednisone  daily   Cardiomyopathy [facility])    a. EF 25% TEE July 2013; b. EF normalized 2015;  c. 03/2015 Echo: EF 40-45%, difrf HK, PASP 38 mmHg, Mild MR, sev LAE/RAE.   Chronic constipation    takes OTC stool softener   COPD (chronic obstructive pulmonary disease) (HCC)    one dr says COPD; one dr says emphysema (09/18/2017)   COVID-19 12/09/2019   DEPRESSION    takes Zoloft  and Doxepin  daily   Diverticulitis    DYSKINESIA, ESOPHAGUS    Essential hypertension        FIBROMYALGIA    GERD (gastroesophageal reflux disease)        Glaucoma    HYPERLIPIDEMIA    a. Intolerant to statins.   INSOMNIA    takes Ambien  nightly   Myocardial infarction [facility])    a. 2012 Myoview  notable for prior infarct;  b. 03/2015 Lexiscan  CL: EF 37%, diff HK, small  area of inferior infarct from apex to base-->Med Rx.   O2 dependent    2.5L q hs & prn (09/18/2017)   Paroxysmal atrial fibrillation (HCC)    a. CHA2DS2VASc = 3--> takes Coumadin ;  b. 03/15/2015 Successful TEE/DCCV;  c. 03/2015 recurrent afib, Amio d/c'd in setting of hyperthyroidism.   Peripheral neuropathy    Pneumonia 12/2016   Rash and other nonspecific skin eruption 04/12/2009   no cause found saw dermatologists x 2 and allergist   SLEEP APNEA, OBSTRUCTIVE    a. doesn't use CPAP   Syncope    a. 03/2015 s/p MDT LINQ.   Type II diabetes mellitus (HCC)         Past Surgical History:  Procedure Laterality Date   ACNE CYST REMOVAL     2 on back     AV NODE ABLATION N/A 10/25/2017   Procedure: AV NODE ABLATION;  Surgeon: Fernande Elspeth BROCKS, MD;  Location: [facility] INVASIVE CV LAB;  Service: Cardiovascular;  Laterality: N/A;   BIV ICD INSERTION CRT-D N/A 09/18/2017   Procedure: BIV ICD INSERTION CRT-D;  Surgeon: Fernande Elspeth BROCKS, MD;  Location: [facility] INVASIVE CV LAB;  Service: Cardiovascular;  Laterality: N/A;   CARDIAC CATHETERIZATION N/A 03/21/2016   Procedure: Right/Left Heart Cath and Coronary Angiography;  Surgeon: Ezra GORMAN Shuck, MD;  Location: [facility] INVASIVE CV LAB;  Service: Cardiovascular;  Laterality: N/A;   CARDIOVERSION  04/18/2012   Procedure: CARDIOVERSION;  Surgeon: Vina LULLA Gull, MD;  Location: [facility] OR;  Service: Cardiovascular;  Laterality: N/A;   CARDIOVERSION  04/25/2012   Procedure: CARDIOVERSION;  Surgeon: Aleene JINNY Passe, MD;  Location: [facility] ENDOSCOPY;  Service: Cardiovascular;  Laterality: N/A;   CARDIOVERSION  04/25/2012   Procedure: CARDIOVERSION;  Surgeon: Vina LULLA Gull, MD;  Location: [facility] OR;  Service: Cardiovascular;  Laterality: N/A;   CARDIOVERSION  05/09/2012   Procedure: CARDIOVERSION;  Surgeon: Ozell Fell, MD;  Location: [facility] OR;  Service: Cardiovascular;  Laterality: N/A;  changed from crenshaw to cooper by trish/leone-endo   CARDIOVERSION N/A 03/15/2015   Procedure: CARDIOVERSION;  Surgeon: Aleene JINNY Passe, MD;  Location: [facility] ENDOSCOPY;  Service: Cardiovascular;  Laterality: N/A;   COLONOSCOPY     COLONOSCOPY WITH PROPOFOL  N/A 10/21/2014   Procedure: COLONOSCOPY WITH PROPOFOL ;  Surgeon: Gwendlyn ONEIDA Buddy, MD;  Location: [facility] ENDOSCOPY;  Service: Endoscopy;  Laterality: N/A;   EP IMPLANTABLE DEVICE N/A 04/06/2015   Procedure: Loop Recorder Insertion;  Surgeon: Danelle LELON Birmingham, MD;  Location: [facility] INVASIVE CV LAB;  Service: Cardiovascular;  Laterality: N/A;   ESOPHAGOGASTRODUODENOSCOPY     JOINT REPLACEMENT     LOOP RECORDER REMOVAL N/A 09/18/2017   Procedure: LOOP RECORDER REMOVAL;  Surgeon: Fernande Elspeth BROCKS, MD;  Location: [facility] INVASIVE CV LAB;   Service: Cardiovascular;  Laterality: N/A;   RIGHT/LEFT HEART CATH AND CORONARY ANGIOGRAPHY N/A 01/28/2017   Procedure: Right/Left Heart Cath and Coronary Angiography;  Surgeon: Ezra GORMAN Shuck, MD;  Location: [facility] INVASIVE CV LAB;  Service: Cardiovascular;  Laterality: N/A;   TEE WITHOUT CARDIOVERSION  04/25/2012   Procedure: TRANSESOPHAGEAL ECHOCARDIOGRAM (TEE);  Surgeon: Aleene JINNY Passe, MD;  Location: [facility] ENDOSCOPY;  Service: Cardiovascular;  Laterality: N/A;   TEE WITHOUT CARDIOVERSION N/A 03/15/2015   Procedure: TRANSESOPHAGEAL ECHOCARDIOGRAM (TEE);  Surgeon: Aleene JINNY Passe, MD;  Location: [facility] ENDOSCOPY;  Service: Cardiovascular;  Laterality: N/A;   TONSILLECTOMY AND ADENOIDECTOMY     TOTAL KNEE ARTHROPLASTY Right 06/15/2014   Procedure: TOTAL KNEE ARTHROPLASTY;  Surgeon: Evalene JONETTA Chancy, MD;  Location: [facility] OR;  Service: Orthopedics;  Laterality: Right;   TOTAL KNEE ARTHROPLASTY Left 10/17/2021   Procedure: Left TOTAL KNEE ARTHROPLASTY;  Surgeon: Vernetta Lonni GRADE, MD;  Location: [facility] OR;  Service: Orthopedics;  Laterality: Left;    Current Outpatient Medications  Medication Sig Dispense Refill   acetaminophen  (TYLENOL ) 325 MG tablet Take 2 tablets (650 mg total) by mouth every 6 (six) hours as needed for fever or mild pain (pain score 1-3).     albuterol  (VENTOLIN  HFA) 108 (90 Base) MCG/ACT inhaler INHALE 1-2 PUFFS BY MOUTH EVERY 6 HOURS AS NEEDED FOR WHEEZE OR SHORTNESS OF BREATH 8.5 each 5   ammonium lactate  (AMLACTIN) 12 % cream Apply 1 Application topically as needed for dry skin. 385 g 3   apixaban  (ELIQUIS ) 5 MG TABS tablet Take 1 tablet (5 mg total) by mouth 2 (two) times daily. 60 tablet 11   ascorbic acid (VITAMIN C) 500 MG tablet Take 500 mg by mouth daily.     augmented betamethasone  dipropionate (DIPROLENE -AF) 0.05 % cream Apply 1 application  topically daily as needed (to affected areas- for itching).     bisacodyl  5 MG EC tablet Take 1 tablet (5 mg total) by mouth daily as  needed for moderate constipation or mild constipation (if no bowel movement in two days on Linzess ). 30 tablet 0   Cholecalciferol  (VITAMIN D3) 50 MCG (2000 UT) TABS Take 2,000 Units by mouth daily.     Cyanocobalamin  (VITAMIN B-12) 2500 MCG SUBL Place 2,500 mcg under the tongue daily.     docusate sodium  (COLACE) 100 MG capsule Take 1 capsule (100 mg total) by mouth 2 (two) times daily as needed for mild constipation.     doxepin  (SINEQUAN ) 10 MG capsule Take 2 capsules (20 mg total) by mouth at bedtime. 180 capsule 1   DULoxetine  (CYMBALTA ) 60 MG capsule TAKE 1 CAPSULE BY MOUTH EVERY DAY 90 capsule 2   Dupilumab (DUPIXENT) 300 MG/2ML SOPN Inject 300 mg into the skin every 14 (fourteen) days.     esomeprazole  (NEXIUM ) 40 MG capsule Take 1 capsule (40 mg total) by mouth daily at 12 noon. 30 capsule 2   Evolocumab  (REPATHA  SURECLICK) 140 MG/ML SOAJ INJECT 140 MG INTO THE SKIN EVERY 14 DAYS 6 mL 0   fluticasone  (FLONASE ) 50 MCG/ACT nasal spray Place 2 sprays into both nostrils daily as needed for allergies or rhinitis. 48 mL 1   gabapentin  (NEURONTIN ) 100 MG capsule Take 1 capsule (100 mg total) by mouth at bedtime.     ipratropium-albuterol  (DUONEB) 0.5-2.5 (3) MG/3ML SOLN TAKE 3 ML BY NEBULIZATION EVERY 4 HOURS AS NEEDED (WHEEZING). 360 mL 0   leptospermum manuka honey (MEDIHONEY) PSTE paste Apply 1 Application topically daily. 44 mL 2   lidocaine  (LIDODERM ) 5 % Place 1 patch onto the skin daily. Remove & Discard patch within 12 hours or as directed by MD 30 patch 2   linaclotide  (LINZESS ) 145 MCG CAPS capsule Take 1 capsule (145 mcg total) by mouth daily before breakfast. 30 capsule 2   linaclotide  (LINZESS ) 145 MCG CAPS capsule Take 1 capsule (145 mcg total) by mouth daily before breakfast. 90 capsule 3   magnesium  hydroxide (MILK OF MAGNESIA) 400 MG/5ML suspension Take 5 mLs by mouth daily as needed for mild constipation. 355 mL 0   Multiple Vitamins-Minerals (SENIOR VITES PO) Take 1 tablet by  mouth daily with breakfast.     omeprazole  (PRILOSEC) 20 MG capsule TAKE 1 CAPSULE BY MOUTH TWICE A  DAY 180 capsule 1   oxyCODONE  (ROXICODONE ) 5 MG immediate release tablet Take 1 tablet (5 mg total) by mouth every 6 (six) hours as needed for severe pain (pain score 7-10). 30 tablet 0   potassium chloride  SA (KLOR-CON  M) 20 MEQ tablet Take 1 tablet (20 mEq total) by mouth daily. 90 tablet 3   predniSONE  (DELTASONE ) 10 MG tablet Take 10 mg by mouth every morning.     tamsulosin  (FLOMAX ) 0.4 MG CAPS capsule Take 1 capsule (0.4 mg total) by mouth daily. 30 capsule 0   Tiotropium Bromide  Monohydrate (SPIRIVA  RESPIMAT) 2.5 MCG/ACT AERS Take 2 puffs by nebulization daily. 4 g 3   tirzepatide  (MOUNJARO ) 7.5 MG/0.5ML Pen Inject 7.5 mg into the skin once a week. 6 mL 3   tiZANidine  (ZANAFLEX ) 4 MG tablet Take 1 tablet (4 mg total) by mouth at bedtime as needed for muscle spasms. 10 tablet 0   torsemide  (DEMADEX ) 20 MG tablet Take 3 tablets (60 mg total) by mouth 2 (two) times daily. 540 tablet 2   traMADol  (ULTRAM ) 50 MG tablet Take 1 tablet (50 mg total) by mouth every 8 (eight) hours as needed for moderate pain (pain score 4-6). 15 tablet 0   TUMS 500 MG chewable tablet Chew 1 tablet by mouth 2 (two) times daily as needed for indigestion or heartburn.     venlafaxine  XR (EFFEXOR -XR) 150 MG 24 hr capsule TAKE 1 CAPSULE BY MOUTH DAILY WITH BREAKFAST. 90 capsule 3   WIXELA INHUB 250-50 MCG/ACT AEPB INHALE 1 PUFF INTO THE LUNGS IN THE MORNING AND AT BEDTIME. 60 each 8   No current facility-administered medications for this visit.    Allergies as of 04/23/2024 - Review Complete 04/23/2024  Allergen Reaction Noted   Amiodarone  Other (See Comments) 09/01/2015   Statins Other (See Comments) 02/18/2012   Wound dressing adhesive Other (See Comments) 03/04/2015    Family History  Problem Relation Age of Onset   COPD Mother    Asthma Mother    Colon polyps Mother    Allergies Mother    Hypothyroidism  Mother    Asthma Maternal Grandmother    Colon cancer Neg Hx     Review of Systems:    Constitutional: No weight loss, fever, chills, weakness or fatigue HEENT: Eyes: No change in vision               Ears, Nose, Throat:  No change in hearing or congestion Skin: No rash or itching Cardiovascular: No chest pain, chest pressure or palpitations   Respiratory: No SOB or cough Gastrointestinal: See HPI and otherwise negative Genitourinary: No dysuria or change in urinary frequency Neurological: No headache, dizziness or syncope Musculoskeletal: No new muscle or joint pain Hematologic: No bleeding or bruising Psychiatric: No history of depression or anxiety    Physical Exam:  Vital signs: BP 94/60 (BP Location: Right Arm, Patient Position: Sitting, Cuff Size: Normal)   Pulse 75   Ht 5' 10 (1.778 m) Comment: Patient stated  Wt 245 lb (111.1 kg) Comment: Patient stated  BMI 35.15 kg/m   Constitutional:   Pleasant  male appears to be in NAD, Well developed, Well nourished, alert and cooperative.  Will chair bound Throat: Oral cavity and pharynx without inflammation, swelling or lesion.  Respiratory: Respirations even and unlabored. Lung sounds diminished bilaterally.   No wheezes, crackles, or rhonchi.  Patient on nasal cannula 2 L Cardiovascular: Normal S1, S2. Regular rate and rhythm.  Gastrointestinal:  Soft, nondistended, nontender.  Obese. no rebound or guarding.  Hyperactive bowel sounds. No appreciable masses or hepatomegaly. Rectal:  Not performed.  Msk:  Symmetrical without gross deformities. Neurologic:  Alert and  oriented x4;  grossly normal neurologically.  Skin:   Dry and intact, several bruises and discoloration noted to upper extremities and lower extremities. Psychiatric: Oriented to person, place and time. Demonstrates good judgement and reason without abnormal affect or behaviors.  RELEVANT LABS AND IMAGING: CBC    Latest Ref Rng & Units 04/17/2024    4:22 AM  04/16/2024    4:11 AM 04/15/2024    3:54 AM  CBC  WBC 4.0 - 10.5 K/uL 9.6  7.5  8.8   Hemoglobin 13.0 - 17.0 g/dL 89.0  89.0  88.7   Hematocrit 39.0 - 52.0 % 35.3  35.5  37.2   Platelets 150 - 400 K/uL 359  347  347      CMP     Latest Ref Rng & Units 04/17/2024    4:22 AM 04/16/2024    4:11 AM 04/15/2024    3:54 AM  CMP  Glucose 70 - 99 mg/dL 891  99  898   BUN 8 - 23 mg/dL 16  11  13    Creatinine 0.61 - 1.24 mg/dL 8.75  9.06  8.92   Sodium 135 - 145 mmol/L 134  136  136   Potassium 3.5 - 5.1 mmol/L 3.2  3.2  3.5   Chloride 98 - 111 mmol/L 91  89  92   CO2 22 - 32 mmol/L 32  35  31   Calcium  8.9 - 10.3 mg/dL 9.0  9.1  8.7      Lab Results  Component Value Date   TSH 2.02 01/07/2024  11/04/2023 echo-Left ventricular ejection fraction, by estimation, is 40 to 45%.  04/12/24 CT CBD/pelvis W contrast IMPRESSION: 1. Signs of acute diverticulitis involving the distal descending colon with wall thickening and surrounding soft tissue stranding. No signs of perforation or abscess. 2. There is a 5 mm stone within the distal left ureter which does not result in significant left-sided hydronephrosis or hydroureter. 3. Bilateral kidney stones. 4. Atelectasis/consolidation noted involving the left lower lobe. 5. Inferior endplate deformity involving the T11 vertebra with interval increase and sclerosis along the endplate compared with 01/24/2024. 6. There is new, age indeterminate mild superior endplate compression deformity involving the T12 vertebral with increased sclerosis along the superior endplate. 7.  Aortic Atherosclerosis (ICD10-I70.0). 03/04/2015 colonoscopy [facility] , recall 5 years Endoscopic impression diverticulosis, hemorrhoids 10/21/2014 colonoscopy with Dr. Aneita Sessile polyp in the transverse colon Sessile polyp in the transverse colon Moderate diverticulosis in the sigmoid and descending colon Grade 2 internal hemorrhoids 06/01/2008 colonoscopy with Dr.  Aneita Multiple polyps hepatic flexure to transverse colon minimum size 3 mm, max size 4 mm 3 polyp sent to pathology Normal exam cecum to ascending colon Transverse colon polyp maximum size 7 mm sessile polyp Polyp sent to path Diverticulosis descending colon to sigmoid colon.  No bleeding Hemorrhoids internal small nonbleeding nonthrombosed 04/2005 EGD for dysphagia Proximal esophagus to distal esophagus is normal Dilatation for dysphagia without stricture 18 mm no resistance.  Patient tolerated excellent. Normal pyloric sphincter to duodenal second portion Normal cardia to body Polyp in atrium 5 mm sessile polyp  Assessment:   68 year old male patient who presents with main complaint of dysphagia and chronic constipation.  Patient has not has history of GERD and has required dilatation back in 2006.  He is  currently on Nexium  20 mg p.o. daily and describes oropharyngeal dysphagia with both solids and liquids.  Patient denies history of stroke.  Patient recently discharged from hospital and receiving rehab.  Unfortunately patient's overall condition he would be considered high risk for any endoscopic procedures at this time and would need to be done at the hospital with continuous oxygen .  I will proceed to a esophagram with tablet to rule out any stricture or web before considering any invasive procedures.  Will also increase his esomeprazole  to 40 mg p.o. daily.      Patient also has history of chronic constipation but due to rehab he has not been on a daily medication.  Will go ahead and have patient take Linzess  145 mcg p.o. daily scheduled and use over-the-counter milk of magnesium  or Dulcolax as needed if patient goes more than 2 days without a bowel movement.  Patient recently had bout of diverticulitis that was treated with IV antibiotics.  Recommendations for follow-up colonoscopy patient is due for colon screening but due to his current condition would need to defer procedures until  patient has fully recovered.   Plan: -Increase esomeprazole  20 mg po daily to 40mg  po daily - Reinforce strict GERD diet, no late meals 3 to 4 hours before lying down -Will order esophagram with tablet to rule out stricture or web - Linzess  145 mcg p.o. daily, new prescription printed - Over-the-counter magnesium  as needed -Will defer procedures until patient has fully recovered and is in better state of health, would need to be done at the hospital with continuous oxygen  and being wheelchair-bound.  Patient will also need clearance for Eliquis . - Follow-up with me in 3 months   Hope Brandenburger, FNP-C [facility] 04/23/2024, 5:15 PM  Cc: Norleen Lynwood ORN, MD

## 2024-04-24 ENCOUNTER — Telehealth: Payer: Self-pay

## 2024-04-24 DIAGNOSIS — J449 Chronic obstructive pulmonary disease, unspecified: Secondary | ICD-10-CM | POA: Diagnosis not present

## 2024-04-24 DIAGNOSIS — I10 Essential (primary) hypertension: Secondary | ICD-10-CM | POA: Diagnosis not present

## 2024-04-24 DIAGNOSIS — K219 Gastro-esophageal reflux disease without esophagitis: Secondary | ICD-10-CM | POA: Diagnosis not present

## 2024-04-24 DIAGNOSIS — E119 Type 2 diabetes mellitus without complications: Secondary | ICD-10-CM | POA: Diagnosis not present

## 2024-04-24 DIAGNOSIS — M797 Fibromyalgia: Secondary | ICD-10-CM | POA: Diagnosis not present

## 2024-04-24 DIAGNOSIS — K5904 Chronic idiopathic constipation: Secondary | ICD-10-CM | POA: Diagnosis not present

## 2024-04-24 DIAGNOSIS — K5792 Diverticulitis of intestine, part unspecified, without perforation or abscess without bleeding: Secondary | ICD-10-CM | POA: Diagnosis not present

## 2024-04-24 DIAGNOSIS — I480 Paroxysmal atrial fibrillation: Secondary | ICD-10-CM | POA: Diagnosis not present

## 2024-04-24 DIAGNOSIS — F32A Depression, unspecified: Secondary | ICD-10-CM | POA: Diagnosis not present

## 2024-04-24 NOTE — Telephone Encounter (Signed)
 Called and spoke with patient. Informed him that they were correct, the 7.5 mg dosage was what they currently should be taking. Patient will work to try to get these back to [facility]

## 2024-04-24 NOTE — Telephone Encounter (Signed)
 Patient called and he says he's in rehab right now, but a big box of mounjaro  came to his home and they are not the right dosage, he's on 7.5 mg. I asked if he knew where they came from and he didn't know. I called his girlfriend and connected him on the call. She says it is mounjaro  5 mg from [employer]. Advised her to call [facility] to let them know the dosage has been changed and he's now using [facility]. Advised he probably used them and is on automatic refills. She says yes that's the case. Advised to ask if she can send them back. She says she will call.   Copied from CRM [id number]. Topic: Clinical - Medication Question >> Apr 24, 2024  1:45 PM Geroldine GRADE wrote: Reason for CRM: Patient is calling because he received his mounjaro  today and  He thinks they gave him the incorrect one. He stated the dosage is lower than what he normally takes. He would like a call to see if he is supposed to continue with these or what should he do

## 2024-04-25 DIAGNOSIS — M797 Fibromyalgia: Secondary | ICD-10-CM | POA: Diagnosis not present

## 2024-04-25 DIAGNOSIS — I480 Paroxysmal atrial fibrillation: Secondary | ICD-10-CM | POA: Diagnosis not present

## 2024-04-25 DIAGNOSIS — E119 Type 2 diabetes mellitus without complications: Secondary | ICD-10-CM | POA: Diagnosis not present

## 2024-04-25 DIAGNOSIS — K5792 Diverticulitis of intestine, part unspecified, without perforation or abscess without bleeding: Secondary | ICD-10-CM | POA: Diagnosis not present

## 2024-04-25 DIAGNOSIS — F32A Depression, unspecified: Secondary | ICD-10-CM | POA: Diagnosis not present

## 2024-04-25 DIAGNOSIS — K219 Gastro-esophageal reflux disease without esophagitis: Secondary | ICD-10-CM | POA: Diagnosis not present

## 2024-04-25 DIAGNOSIS — K5904 Chronic idiopathic constipation: Secondary | ICD-10-CM | POA: Diagnosis not present

## 2024-04-25 DIAGNOSIS — I10 Essential (primary) hypertension: Secondary | ICD-10-CM | POA: Diagnosis not present

## 2024-04-25 DIAGNOSIS — J449 Chronic obstructive pulmonary disease, unspecified: Secondary | ICD-10-CM | POA: Diagnosis not present

## 2024-04-26 DIAGNOSIS — T481X1D Poisoning by skeletal muscle relaxants [neuromuscular blocking agents], accidental (unintentional), subsequent encounter: Secondary | ICD-10-CM | POA: Diagnosis not present

## 2024-04-26 DIAGNOSIS — R6521 Severe sepsis with septic shock: Secondary | ICD-10-CM | POA: Diagnosis not present

## 2024-04-26 DIAGNOSIS — M6281 Muscle weakness (generalized): Secondary | ICD-10-CM | POA: Diagnosis not present

## 2024-04-26 DIAGNOSIS — R1312 Dysphagia, oropharyngeal phase: Secondary | ICD-10-CM | POA: Diagnosis not present

## 2024-04-26 DIAGNOSIS — K5732 Diverticulitis of large intestine without perforation or abscess without bleeding: Secondary | ICD-10-CM | POA: Diagnosis not present

## 2024-04-26 DIAGNOSIS — R262 Difficulty in walking, not elsewhere classified: Secondary | ICD-10-CM | POA: Diagnosis not present

## 2024-04-26 DIAGNOSIS — M797 Fibromyalgia: Secondary | ICD-10-CM | POA: Diagnosis not present

## 2024-04-26 DIAGNOSIS — N390 Urinary tract infection, site not specified: Secondary | ICD-10-CM | POA: Diagnosis not present

## 2024-04-26 DIAGNOSIS — E1140 Type 2 diabetes mellitus with diabetic neuropathy, unspecified: Secondary | ICD-10-CM | POA: Diagnosis not present

## 2024-04-27 ENCOUNTER — Encounter

## 2024-04-27 DIAGNOSIS — K5904 Chronic idiopathic constipation: Secondary | ICD-10-CM | POA: Diagnosis not present

## 2024-04-27 DIAGNOSIS — I480 Paroxysmal atrial fibrillation: Secondary | ICD-10-CM | POA: Diagnosis not present

## 2024-04-27 DIAGNOSIS — E119 Type 2 diabetes mellitus without complications: Secondary | ICD-10-CM | POA: Diagnosis not present

## 2024-04-27 DIAGNOSIS — K219 Gastro-esophageal reflux disease without esophagitis: Secondary | ICD-10-CM | POA: Diagnosis not present

## 2024-04-27 DIAGNOSIS — M797 Fibromyalgia: Secondary | ICD-10-CM | POA: Diagnosis not present

## 2024-04-27 DIAGNOSIS — J449 Chronic obstructive pulmonary disease, unspecified: Secondary | ICD-10-CM | POA: Diagnosis not present

## 2024-04-27 DIAGNOSIS — I10 Essential (primary) hypertension: Secondary | ICD-10-CM | POA: Diagnosis not present

## 2024-04-27 DIAGNOSIS — K5792 Diverticulitis of intestine, part unspecified, without perforation or abscess without bleeding: Secondary | ICD-10-CM | POA: Diagnosis not present

## 2024-04-27 DIAGNOSIS — F32A Depression, unspecified: Secondary | ICD-10-CM | POA: Diagnosis not present

## 2024-04-28 DIAGNOSIS — K5792 Diverticulitis of intestine, part unspecified, without perforation or abscess without bleeding: Secondary | ICD-10-CM | POA: Diagnosis not present

## 2024-04-28 DIAGNOSIS — I10 Essential (primary) hypertension: Secondary | ICD-10-CM | POA: Diagnosis not present

## 2024-04-28 DIAGNOSIS — M797 Fibromyalgia: Secondary | ICD-10-CM | POA: Diagnosis not present

## 2024-04-28 DIAGNOSIS — L89890 Pressure ulcer of other site, unstageable: Secondary | ICD-10-CM | POA: Diagnosis not present

## 2024-04-28 DIAGNOSIS — J449 Chronic obstructive pulmonary disease, unspecified: Secondary | ICD-10-CM | POA: Diagnosis not present

## 2024-04-28 DIAGNOSIS — E1140 Type 2 diabetes mellitus with diabetic neuropathy, unspecified: Secondary | ICD-10-CM | POA: Diagnosis not present

## 2024-04-28 DIAGNOSIS — K5904 Chronic idiopathic constipation: Secondary | ICD-10-CM | POA: Diagnosis not present

## 2024-04-28 DIAGNOSIS — K5732 Diverticulitis of large intestine without perforation or abscess without bleeding: Secondary | ICD-10-CM | POA: Diagnosis not present

## 2024-04-28 DIAGNOSIS — I480 Paroxysmal atrial fibrillation: Secondary | ICD-10-CM | POA: Diagnosis not present

## 2024-04-28 DIAGNOSIS — E119 Type 2 diabetes mellitus without complications: Secondary | ICD-10-CM | POA: Diagnosis not present

## 2024-04-28 DIAGNOSIS — K219 Gastro-esophageal reflux disease without esophagitis: Secondary | ICD-10-CM | POA: Diagnosis not present

## 2024-04-28 DIAGNOSIS — F32A Depression, unspecified: Secondary | ICD-10-CM | POA: Diagnosis not present

## 2024-04-29 DIAGNOSIS — I480 Paroxysmal atrial fibrillation: Secondary | ICD-10-CM | POA: Diagnosis not present

## 2024-04-29 DIAGNOSIS — I10 Essential (primary) hypertension: Secondary | ICD-10-CM | POA: Diagnosis not present

## 2024-04-29 DIAGNOSIS — J449 Chronic obstructive pulmonary disease, unspecified: Secondary | ICD-10-CM | POA: Diagnosis not present

## 2024-04-29 DIAGNOSIS — K5792 Diverticulitis of intestine, part unspecified, without perforation or abscess without bleeding: Secondary | ICD-10-CM | POA: Diagnosis not present

## 2024-04-29 DIAGNOSIS — F32A Depression, unspecified: Secondary | ICD-10-CM | POA: Diagnosis not present

## 2024-04-29 DIAGNOSIS — K219 Gastro-esophageal reflux disease without esophagitis: Secondary | ICD-10-CM | POA: Diagnosis not present

## 2024-04-29 DIAGNOSIS — E119 Type 2 diabetes mellitus without complications: Secondary | ICD-10-CM | POA: Diagnosis not present

## 2024-04-29 DIAGNOSIS — K5904 Chronic idiopathic constipation: Secondary | ICD-10-CM | POA: Diagnosis not present

## 2024-04-29 DIAGNOSIS — M797 Fibromyalgia: Secondary | ICD-10-CM | POA: Diagnosis not present

## 2024-04-30 ENCOUNTER — Ambulatory Visit: Admitting: Podiatry

## 2024-05-01 ENCOUNTER — Ambulatory Visit (HOSPITAL_COMMUNITY)
Admission: RE | Admit: 2024-05-01 | Discharge: 2024-05-01 | Disposition: A | Discharge status: Home / Self Care | Source: Ambulatory Visit | Attending: Gastroenterology | Admitting: Gastroenterology

## 2024-05-01 DIAGNOSIS — K5904 Chronic idiopathic constipation: Secondary | ICD-10-CM | POA: Diagnosis not present

## 2024-05-01 DIAGNOSIS — G243 Spasmodic torticollis: Secondary | ICD-10-CM | POA: Diagnosis not present

## 2024-05-01 DIAGNOSIS — K224 Dyskinesia of esophagus: Secondary | ICD-10-CM | POA: Diagnosis not present

## 2024-05-01 DIAGNOSIS — R1310 Dysphagia, unspecified: Secondary | ICD-10-CM | POA: Diagnosis not present

## 2024-05-01 DIAGNOSIS — K219 Gastro-esophageal reflux disease without esophagitis: Secondary | ICD-10-CM | POA: Diagnosis present

## 2024-05-01 DIAGNOSIS — I480 Paroxysmal atrial fibrillation: Secondary | ICD-10-CM | POA: Diagnosis not present

## 2024-05-01 DIAGNOSIS — M797 Fibromyalgia: Secondary | ICD-10-CM | POA: Diagnosis not present

## 2024-05-01 DIAGNOSIS — I10 Essential (primary) hypertension: Secondary | ICD-10-CM | POA: Diagnosis not present

## 2024-05-01 DIAGNOSIS — F32A Depression, unspecified: Secondary | ICD-10-CM | POA: Diagnosis not present

## 2024-05-01 DIAGNOSIS — K5792 Diverticulitis of intestine, part unspecified, without perforation or abscess without bleeding: Secondary | ICD-10-CM | POA: Diagnosis not present

## 2024-05-01 DIAGNOSIS — R1319 Other dysphagia: Secondary | ICD-10-CM | POA: Diagnosis not present

## 2024-05-01 DIAGNOSIS — E119 Type 2 diabetes mellitus without complications: Secondary | ICD-10-CM | POA: Diagnosis not present

## 2024-05-01 DIAGNOSIS — J449 Chronic obstructive pulmonary disease, unspecified: Secondary | ICD-10-CM | POA: Diagnosis not present

## 2024-05-04 ENCOUNTER — Encounter (HOSPITAL_COMMUNITY): Payer: Self-pay | Admitting: Emergency Medicine

## 2024-05-04 ENCOUNTER — Emergency Department (HOSPITAL_COMMUNITY)

## 2024-05-04 ENCOUNTER — Emergency Department (HOSPITAL_COMMUNITY)
Admission: EM | Admit: 2024-05-04 | Discharge: 2024-05-05 | Disposition: A | Discharge status: Home / Self Care | Attending: Emergency Medicine | Admitting: Emergency Medicine

## 2024-05-04 ENCOUNTER — Other Ambulatory Visit: Payer: Self-pay

## 2024-05-04 DIAGNOSIS — K219 Gastro-esophageal reflux disease without esophagitis: Secondary | ICD-10-CM | POA: Diagnosis not present

## 2024-05-04 DIAGNOSIS — I119 Hypertensive heart disease without heart failure: Secondary | ICD-10-CM | POA: Diagnosis not present

## 2024-05-04 DIAGNOSIS — Z87891 Personal history of nicotine dependence: Secondary | ICD-10-CM | POA: Diagnosis not present

## 2024-05-04 DIAGNOSIS — I429 Cardiomyopathy, unspecified: Secondary | ICD-10-CM | POA: Insufficient documentation

## 2024-05-04 DIAGNOSIS — I10 Essential (primary) hypertension: Secondary | ICD-10-CM | POA: Diagnosis not present

## 2024-05-04 DIAGNOSIS — I6523 Occlusion and stenosis of bilateral carotid arteries: Secondary | ICD-10-CM | POA: Insufficient documentation

## 2024-05-04 DIAGNOSIS — Z8616 Personal history of COVID-19: Secondary | ICD-10-CM | POA: Diagnosis not present

## 2024-05-04 DIAGNOSIS — J90 Pleural effusion, not elsewhere classified: Secondary | ICD-10-CM | POA: Diagnosis not present

## 2024-05-04 DIAGNOSIS — R55 Syncope and collapse: Secondary | ICD-10-CM | POA: Diagnosis not present

## 2024-05-04 DIAGNOSIS — K5904 Chronic idiopathic constipation: Secondary | ICD-10-CM | POA: Diagnosis not present

## 2024-05-04 DIAGNOSIS — R0989 Other specified symptoms and signs involving the circulatory and respiratory systems: Secondary | ICD-10-CM | POA: Diagnosis not present

## 2024-05-04 DIAGNOSIS — Z5986 Financial insecurity: Secondary | ICD-10-CM | POA: Diagnosis not present

## 2024-05-04 DIAGNOSIS — J449 Chronic obstructive pulmonary disease, unspecified: Secondary | ICD-10-CM | POA: Diagnosis not present

## 2024-05-04 DIAGNOSIS — R42 Dizziness and giddiness: Secondary | ICD-10-CM | POA: Diagnosis present

## 2024-05-04 DIAGNOSIS — R918 Other nonspecific abnormal finding of lung field: Secondary | ICD-10-CM | POA: Diagnosis not present

## 2024-05-04 DIAGNOSIS — E119 Type 2 diabetes mellitus without complications: Secondary | ICD-10-CM | POA: Diagnosis not present

## 2024-05-04 DIAGNOSIS — K5792 Diverticulitis of intestine, part unspecified, without perforation or abscess without bleeding: Secondary | ICD-10-CM | POA: Diagnosis not present

## 2024-05-04 DIAGNOSIS — I480 Paroxysmal atrial fibrillation: Secondary | ICD-10-CM | POA: Diagnosis not present

## 2024-05-04 DIAGNOSIS — M797 Fibromyalgia: Secondary | ICD-10-CM | POA: Diagnosis not present

## 2024-05-04 DIAGNOSIS — Z95810 Presence of automatic (implantable) cardiac defibrillator: Secondary | ICD-10-CM | POA: Diagnosis not present

## 2024-05-04 DIAGNOSIS — R079 Chest pain, unspecified: Secondary | ICD-10-CM | POA: Insufficient documentation

## 2024-05-04 DIAGNOSIS — F32A Depression, unspecified: Secondary | ICD-10-CM | POA: Diagnosis not present

## 2024-05-04 LAB — CBC WITH DIFFERENTIAL/PLATELET
Abs Immature Granulocytes: 0.06 K/uL (ref 0.00–0.07)
Basophils Absolute: 0.1 K/uL (ref 0.0–0.1)
Basophils Relative: 1 %
Eosinophils Absolute: 0.5 K/uL (ref 0.0–0.5)
Eosinophils Relative: 5 %
HCT: 38.3 % — ABNORMAL LOW (ref 39.0–52.0)
Hemoglobin: 12.1 g/dL — ABNORMAL LOW (ref 13.0–17.0)
Immature Granulocytes: 1 %
Lymphocytes Relative: 21 %
Lymphs Abs: 2.2 K/uL (ref 0.7–4.0)
MCH: 28.8 pg (ref 26.0–34.0)
MCHC: 31.6 g/dL (ref 30.0–36.0)
MCV: 91.2 fL (ref 80.0–100.0)
Monocytes Absolute: 1 K/uL (ref 0.1–1.0)
Monocytes Relative: 9 %
Neutro Abs: 7 K/uL (ref 1.7–7.7)
Neutrophils Relative %: 63 %
Platelets: 414 K/uL — ABNORMAL HIGH (ref 150–400)
RBC: 4.2 MIL/uL — ABNORMAL LOW (ref 4.22–5.81)
RDW: 15.4 % (ref 11.5–15.5)
WBC: 10.8 K/uL — ABNORMAL HIGH (ref 4.0–10.5)
nRBC: 0 % (ref 0.0–0.2)

## 2024-05-04 LAB — COMPREHENSIVE METABOLIC PANEL WITH GFR
ALT: 18 U/L (ref 0–44)
AST: 27 U/L (ref 15–41)
Albumin: 3.2 g/dL — ABNORMAL LOW (ref 3.5–5.0)
Alkaline Phosphatase: 91 U/L (ref 38–126)
Anion gap: 14 (ref 5–15)
BUN: 12 mg/dL (ref 8–23)
CO2: 26 mmol/L (ref 22–32)
Calcium: 9.3 mg/dL (ref 8.9–10.3)
Chloride: 98 mmol/L (ref 98–111)
Creatinine, Ser: 0.92 mg/dL (ref 0.61–1.24)
GFR, Estimated: >60 mL/min (ref >60)
Glucose, Bld: 106 mg/dL — ABNORMAL HIGH (ref 70–99)
Potassium: 3.3 mmol/L — ABNORMAL LOW (ref 3.5–5.1)
Sodium: 138 mmol/L (ref 135–145)
Total Bilirubin: 0.6 mg/dL (ref 0.0–1.2)
Total Protein: 6.8 g/dL (ref 6.5–8.1)

## 2024-05-04 LAB — PROTIME-INR
INR: 1.3 — ABNORMAL HIGH (ref 0.8–1.2)
Prothrombin Time: 16.7 s — ABNORMAL HIGH (ref 11.4–15.2)

## 2024-05-04 MED ORDER — ACETAMINOPHEN 325 MG PO TABS: 650.0000 mg | ORAL_TABLET | Freq: Four times a day (QID) | ORAL | Status: DC | PRN

## 2024-05-04 MED ORDER — ACETAMINOPHEN 325 MG PO TABS
650.0000 mg | ORAL_TABLET | Freq: Once | ORAL | Status: AC
  Administered 2024-05-04: 650 mg via ORAL
  Filled 2024-05-04: qty 2

## 2024-05-04 NOTE — ED Triage Notes (Signed)
 Pt BIB [facility] from [facility] for near syncope. Pt has hx of broken neck that healed crooked.  When he turns his head he feels like he will pass out.  PT has had balance issue for a year. This has been ongoing for a month.  Pt was supposed to go home from [facility] today but wanted to come to hospital.  Unclear if he is supposed to return to [facility] or go home home from home.  PT does not want to return to [facility].   VSS 124/70 HR 70 RR 16  99% Hx COPD, 2L baseline CBG 106

## 2024-05-04 NOTE — ED Notes (Signed)
 Patient transported to X-ray

## 2024-05-04 NOTE — ED Provider Triage Note (Signed)
 Emergency Medicine Provider Triage Evaluation Note  [name] , a 68 y.o. male  was evaluated in triage.  Pt complains of near syncope while having his diaper change at [facility]. Prior hx of a carotid artery injury last month, according to Doctor at [facility] thinks this is likely causing his passing out episodes. Also endorses chest pain and shortness of breath  Review of Systems  Positive: Chest pain, sob, near syncope Negative: Headache, neck pan  Physical Exam  BP 128/72 (BP Location: Left Arm)   Pulse 75   Temp 98.1 F (36.7 C) (Oral)   Resp 18   SpO2 98%  Gen:   Awake, no distress   Resp:  Normal effort  MSK:   Moves extremities without difficulty  Other:    Medical Decision Making  Medically screening exam initiated at 3:43 PM.  Appropriate orders placed.  [name] was informed that the remainder of the evaluation will be completed by another provider, this initial triage assessment does not replace that evaluation, and the importance of remaining in the ED until their evaluation is complete.     Nekisha Mcdiarmid, PA-C 05/04/24 [phone]

## 2024-05-05 ENCOUNTER — Ambulatory Visit

## 2024-05-05 ENCOUNTER — Ambulatory Visit: Admitting: Neurology

## 2024-05-05 ENCOUNTER — Emergency Department (HOSPITAL_COMMUNITY)

## 2024-05-05 ENCOUNTER — Ambulatory Visit: Payer: Self-pay | Admitting: Gastroenterology

## 2024-05-05 DIAGNOSIS — R9089 Other abnormal findings on diagnostic imaging of central nervous system: Secondary | ICD-10-CM | POA: Diagnosis not present

## 2024-05-05 DIAGNOSIS — I442 Atrioventricular block, complete: Secondary | ICD-10-CM | POA: Diagnosis not present

## 2024-05-05 DIAGNOSIS — R55 Syncope and collapse: Secondary | ICD-10-CM | POA: Diagnosis not present

## 2024-05-05 DIAGNOSIS — I6523 Occlusion and stenosis of bilateral carotid arteries: Secondary | ICD-10-CM | POA: Diagnosis not present

## 2024-05-05 DIAGNOSIS — R29818 Other symptoms and signs involving the nervous system: Secondary | ICD-10-CM | POA: Diagnosis not present

## 2024-05-05 LAB — URINALYSIS, ROUTINE W REFLEX MICROSCOPIC
Bilirubin Urine: NEGATIVE
Glucose, UA: NEGATIVE mg/dL
Hgb urine dipstick: NEGATIVE
Ketones, ur: NEGATIVE mg/dL
Leukocytes,Ua: NEGATIVE
Nitrite: NEGATIVE
Protein, ur: NEGATIVE mg/dL
Specific Gravity, Urine: >1.046 — ABNORMAL HIGH (ref 1.005–1.030)
pH: 5 (ref 5.0–8.0)

## 2024-05-05 MED ORDER — MECLIZINE HCL 25 MG PO TABS: 25.0000 mg | ORAL_TABLET | Freq: Three times a day (TID) | ORAL | 0 refills | Status: AC | PRN

## 2024-05-05 MED ORDER — IOHEXOL 350 MG/ML SOLN
75.0000 mL | Freq: Once | INTRAVENOUS | Status: AC | PRN
Start: 2024-05-05 — End: 2024-05-05
  Administered 2024-05-05: 75 mL via INTRAVENOUS

## 2024-05-05 NOTE — Discharge Instructions (Signed)
 You were evaluated in the Emergency Department and after careful evaluation, we did not find any emergent condition requiring admission or further testing in the hospital.  Your exam/testing today is overall reassuring.  Symptoms may be due to vertigo.  Use the meclizine  medication as needed, follow-up with your primary care doctor.  Please return to the Emergency Department if you experience any worsening of your condition.   Thank you for allowing us  to be a part of your care.

## 2024-05-05 NOTE — ED Provider Notes (Signed)
 [facility] Emergency Department Provider Note MRN:  [id number]  Arrival date & time: 05/05/24     Chief Complaint   Near syncope History of Present Illness   [name] is a 68 y.o. year-old male with a history of cardiomyopathy presenting to the ED with chief complaint of near syncope.  Patient explains that recently when he is rolled over to his right side at his care facility he becomes extremely lightheaded, the room spins, he nearly passes out.  Worried about this and I doctor told him that it might be a vascular problem.  Here for evaluation.  Currently does not feel dizzy or lightheaded, denies any new numbness or weakness to the arms or legs, no chest pain or shortness of breath, no abdominal pain.  Had an injury to his neck that he says did not heal well.  Review of Systems  A thorough review of systems was obtained and all systems are negative except as noted in the HPI and PMH.   Patient's Health History    Past Medical History:  Diagnosis Date   AICD (automatic cardioverter/defibrillator) present    Anemia    supposed to be taking Vit B but doesn't   ANXIETY    takes Xanax  nightly   Arthritis    Asthma    Albuterol  prn and Advair  daily;also takes Prednisone  daily   Cardiomyopathy [facility])    a. EF 25% TEE July 2013; b. EF normalized 2015;  c. 03/2015 Echo: EF 40-45%, difrf HK, PASP 38 mmHg, Mild MR, sev LAE/RAE.   Chronic constipation    takes OTC stool softener   COPD (chronic obstructive pulmonary disease) (HCC)    one dr says COPD; one dr says emphysema (09/18/2017)   COVID-19 12/09/2019   DEPRESSION    takes Zoloft  and Doxepin  daily   Diverticulitis    DYSKINESIA, ESOPHAGUS    Essential hypertension        FIBROMYALGIA    GERD (gastroesophageal reflux disease)        Glaucoma    HYPERLIPIDEMIA    a. Intolerant to statins.   INSOMNIA    takes Ambien  nightly   Myocardial infarction [facility])    a. 2012 Myoview  notable for prior  infarct;  b. 03/2015 Lexiscan  CL: EF 37%, diff HK, small area of inferior infarct from apex to base-->Med Rx.   O2 dependent    2.5L q hs & prn (09/18/2017)   Paroxysmal atrial fibrillation (HCC)    a. CHA2DS2VASc = 3--> takes Coumadin ;  b. 03/15/2015 Successful TEE/DCCV;  c. 03/2015 recurrent afib, Amio d/c'd in setting of hyperthyroidism.   Peripheral neuropathy    Pneumonia 12/2016   Rash and other nonspecific skin eruption 04/12/2009   no cause found saw dermatologists x 2 and allergist   SLEEP APNEA, OBSTRUCTIVE    a. doesn't use CPAP   Syncope    a. 03/2015 s/p MDT LINQ.   Type II diabetes mellitus (HCC)         Past Surgical History:  Procedure Laterality Date   ACNE CYST REMOVAL     2 on back    AV NODE ABLATION N/A 10/25/2017   Procedure: AV NODE ABLATION;  Surgeon: Fernande Elspeth BROCKS, MD;  Location: [facility] INVASIVE CV LAB;  Service: Cardiovascular;  Laterality: N/A;   BIV ICD INSERTION CRT-D N/A 09/18/2017   Procedure: BIV ICD INSERTION CRT-D;  Surgeon: Fernande Elspeth BROCKS, MD;  Location: [facility] INVASIVE CV LAB;  Service: Cardiovascular;  Laterality: N/A;  CARDIAC CATHETERIZATION N/A 03/21/2016   Procedure: Right/Left Heart Cath and Coronary Angiography;  Surgeon: Ezra GORMAN Shuck, MD;  Location: [facility] INVASIVE CV LAB;  Service: Cardiovascular;  Laterality: N/A;   CARDIOVERSION  04/18/2012   Procedure: CARDIOVERSION;  Surgeon: Vina LULLA Gull, MD;  Location: [facility] OR;  Service: Cardiovascular;  Laterality: N/A;   CARDIOVERSION  04/25/2012   Procedure: CARDIOVERSION;  Surgeon: Aleene JINNY Passe, MD;  Location: [facility] ENDOSCOPY;  Service: Cardiovascular;  Laterality: N/A;   CARDIOVERSION  04/25/2012   Procedure: CARDIOVERSION;  Surgeon: Vina LULLA Gull, MD;  Location: [facility] OR;  Service: Cardiovascular;  Laterality: N/A;   CARDIOVERSION  05/09/2012   Procedure: CARDIOVERSION;  Surgeon: Zachary Fell, MD;  Location: [facility] OR;  Service: Cardiovascular;  Laterality: N/A;  changed from crenshaw to cooper by trish/leone-endo    CARDIOVERSION N/A 03/15/2015   Procedure: CARDIOVERSION;  Surgeon: Aleene JINNY Passe, MD;  Location: [facility] ENDOSCOPY;  Service: Cardiovascular;  Laterality: N/A;   COLONOSCOPY     COLONOSCOPY WITH PROPOFOL  N/A 10/21/2014   Procedure: COLONOSCOPY WITH PROPOFOL ;  Surgeon: Gwendlyn ONEIDA Buddy, MD;  Location: [facility] ENDOSCOPY;  Service: Endoscopy;  Laterality: N/A;   EP IMPLANTABLE DEVICE N/A 04/06/2015   Procedure: Loop Recorder Insertion;  Surgeon: Danelle LELON Birmingham, MD;  [facility] INVAS[facility] CV LAB;  Service: Cardiovascular;  Laterality: N/A;   ESOPHAGOGASTRODUODENOSCOPY     JOINT REPLACEMENT     LOOP RECORDER REMOVAL N/A 09/18/2017   Procedure: LOOP RECORDER REMOVAL;  Surgeon: Fernande Elspeth BROCKS, MD;  Location: [facility] INVASIVE CV LAB;  Service: Cardiovascular;  Laterality: N/A;   RIGHT/LEFT HEART CATH AND CORONARY ANGIOGRAPHY N/A 01/28/2017   Procedure: Right/Left Heart Cath and Coronary Angiography;  Surgeon: Ezra GORMAN Shuck, MD;  Location: [facility] INVASIVE CV LAB;  Service: Cardiovascular;  Laterality: N/A;   TEE WITHOUT CARDIOVERSION  04/25/2012   Procedure: TRANSESOPHAGEAL ECHOCARDIOGRAM (TEE);  Surgeon: Aleene JINNY Passe, MD;  Location: [facility] ENDOSCOPY;  Service: Cardiovascular;  Laterality: N/A;   TEE WITHOUT CARDIOVERSION N/A 03/15/2015   Procedure: TRANSESOPHAGEAL ECHOCARDIOGRAM (TEE);  Surgeon: Aleene JINNY Passe, MD;  Location: [facility] ENDOSCOPY;  Service: Cardiovascular;  Laterality: N/A;   TONSILLECTOMY AND ADENOIDECTOMY     TOTAL KNEE ARTHROPLASTY Right 06/15/2014   Procedure: TOTAL KNEE ARTHROPLASTY;  Surgeon: Evalene JONETTA Chancy, MD;  [facility] OR;  Service: Orthopedics;  Laterality: Right;   TOTAL KNEE ARTHROPLASTY Left 10/17/2021   Procedure: Left TOTAL KNEE ARTHROPLASTY;  Surgeon: Vernetta Lonni GRADE, MD;  Location[facility] OR;  Service: Orthopedics;  Laterality: Left;    Family History  Problem Relation Age of Onset   COPD Mother    Asthma Mother    Colon polyps Mother    Allergies Mother    Hypothyroidism Mother     Asthma Maternal Grandmother    Colon cancer Neg Hx     Social History   Socioeconomic History   Marital status: Divorced    Spouse name: Not on file   Number of children: 2   Years of education: Not on file   Highest education level: GED or equivalent  Occupational History   Occupation: retired/disabled. prev worked in Teacher, English as a foreign language.    Employer: DISABLED  Tobacco Use   Smoking status: Former    Current packs/day: 0.00    Average packs/day: 2.0 packs/day for 30.0 years (60.0 ttl pk-yrs)    Types: Cigarettes    Start date: 10/15/1977    Quit date: 10/16/2007    Years since quitting: 16.5   Smokeless tobacco: Never  Vaping Use   Vaping status: Never Used  Substance and Sexual Activity   Alcohol use: No   Drug use: No   Sexual activity: Not Currently  Other Topics Concern   Not on file  Social History Narrative   Lives with girlfriend   Social Drivers of Health   Financial Resource Strain: Medium Risk (01/01/2024)   Overall Financial Resource Strain (CARDIA)    Difficulty of Paying Living Expenses: Somewhat hard  Food Insecurity: No Food Insecurity (04/13/2024)   Hunger Vital Sign    Worried About Running Out of Food in the Last Year: Never true    Ran Out of Food in the Last Year: Never true  Transportation Needs: No Transportation Needs (04/13/2024)   PRAPARE - Administrator, Civil Service (Medical): No    Lack of Transportation (Non-Medical): No  Physical Activity: Inactive (01/01/2024)   Exercise Vital Sign    Days of Exercise per Week: 0 days    Minutes of Exercise per Session: 0 min  Stress: No Stress Concern Present (01/01/2024)   [employer] of Occupational Health - Occupational Stress Questionnaire    Feeling of Stress : Only a little  Social Connections: Socially Isolated (04/13/2024)   Social Connection and Isolation Panel    Frequency of Communication with Friends and Family: Once a week    Frequency of Social Gatherings with Friends and  Family: Never    Attends Religious Services: Never    Database administrator or Organizations: No    Attends Banker Meetings: Never    Marital Status: Divorced  Catering manager Violence: Not At Risk (04/13/2024)   Humiliation, Afraid, Rape, and Kick questionnaire    Fear of Current or Ex-Partner: No    Emotionally Abused: No    Physically Abused: No    Sexually Abused: No     Physical Exam   Vitals:   05/05/24 0215 05/05/24 0314  BP:  132/65  Pulse: 70 72  Resp:  16  Temp:  98.7 F (37.1 C)  SpO2: 100% 100%    CONSTITUTIONAL: Chronically ill-appearing, NAD NEURO/PSYCH:  Alert and oriented x 3, normal and symmetric strength and sensation, normal coordination, normal speech EYES:  eyes equal and reactive ENT/NECK:  no LAD, no JVD CARDIO: Regular rate, well-perfused, normal S1 and S2 PULM:  CTAB no wheezing or rhonchi GI/GU:  non-distended, non-tender MSK/SPINE:  No gross deformities, no edema SKIN:  no rash, atraumatic   *Additional and/or pertinent findings included in MDM below  Diagnostic and Interventional Summary    EKG Interpretation Date/Time:  May 06, 2019 2503: 54, 27 Ventricular Rate:   71 PR Interval:   210 QRS Duration:   189 QT Interval:   463 QTC Calculation:  504 R Axis:      Text Interpretation: Paced rhythm similar to prior       Labs Reviewed  COMPREHENSIVE METABOLIC PANEL WITH GFR - Abnormal; Notable for the following components:      Result Value   Potassium 3.3 (*)    Glucose, Bld 106 (*)    Albumin  3.2 (*)    All other components within normal limits  URINALYSIS, ROUTINE W REFLEX MICROSCOPIC - Abnormal; Notable for the following components:   Specific Gravity, Urine >1.046 (*)    All other components within normal limits  CBC WITH DIFFERENTIAL/PLATELET - Abnormal; Notable for the following components:   WBC 10.8 (*)    RBC 4.20 (*)    Hemoglobin 12.1 (*)  HCT 38.3 (*)    Platelets 414 (*)    All other components  within normal limits  PROTIME-INR - Abnormal; Notable for the following components:   Prothrombin Time 16.7 (*)    INR 1.3 (*)    All other components within normal limits  CBC  CBG MONITORING, ED    CT ANGIO HEAD NECK W WO CM  Final Result    CT C-SPINE NO CHARGE  Final Result    DG Chest 2 View  Final Result      Medications  acetaminophen  (TYLENOL ) tablet 650 mg (650 mg Oral Given 05/04/24 2105)  iohexol  (OMNIPAQUE ) 350 MG/ML injection 75 mL (75 mLs Intravenous Contrast Given 05/05/24 0219)     Procedures  /  Critical Care Procedures  ED Course and Medical Decision Making  Initial Impression and Ddx Favoring positional vertigo as the cause of the symptoms, other considerations include vascular abnormality from prior neck trauma.  Past medical/surgical history that increases complexity of ED encounter: Pacemaker  Interpretation of Diagnostics I personally reviewed the EKG and my interpretation is as follows: Paced rhythm  No significant blood count or electrolyte disturbance.  Patient Reassessment and Ultimate Disposition/Management     CT imaging unremarkable.  Patient never really passed out.  When teasing out the symptoms it sounds much more like an acute dizziness room spinning vertigo presentation.  No indication for further testing or admission.  Only gets the sensation of dizziness when being turned to the right so highly doubt central cause.  Appropriate for discharge.  Patient management required discussion with the following services or consulting groups:  None  Complexity of Problems Addressed Acute illness or injury that poses threat of life of bodily function  Additional Data Reviewed and Analyzed Further history obtained from: Further history from spouse/family member  Additional Factors Impacting ED Encounter Risk Consideration of hospitalization  Zachary HERO. [place], MD [facility] Emergency Medicine [facility]  Health [email or web address]  Final Clinical Impressions(s) / ED Diagnoses     ICD-10-CM   1. Dizziness  R42       ED Discharge Orders          Ordered    meclizine  (ANTIVERT ) 25 MG tablet  3 times daily PRN        05/05/24 0346             Discharge Instructions Discussed with and Provided to Patient:     Discharge Instructions      You were evaluated in the Emergency Department and after careful evaluation, we did not find any emergent condition requiring admission or further testing in the hospital.  Your exam/testing today is overall reassuring.  Symptoms may be due to vertigo.  Use the meclizine  medication as needed, follow-up with your primary care doctor.  Please return to the Emergency Department if you experience any worsening of your condition.   Thank you for allowing us  to be a part of your care.       [name] Zachary HERO, MD 05/05/24 [phone]

## 2024-05-06 ENCOUNTER — Telehealth: Payer: Self-pay | Admitting: Gastroenterology

## 2024-05-06 ENCOUNTER — Ambulatory Visit: Admitting: Gastroenterology

## 2024-05-06 NOTE — Telephone Encounter (Signed)
 Called patient to discuss results of swallow study.  Dr. Charlanne recommends patient have EGD with dilatation in hospital.  Will need clearance for Eliquis .  Patient agrees to plan.  He tells me he is discharged from rehab and at home.  Patient also ask if he can go ahead and do colonoscopy with EGD.  He reports he could tolerate the prep and assist himself to the restroom.  Last colonoscopy was in 2016 and recommendations for 5-year recall.  There was difficulty with his last colonoscopy requiring him to be referred to [facility] for special equipment.  Will follow-up with Dr. Charlanne today to see if it is okay to proceed with colonoscopy here in the hospital.

## 2024-05-07 ENCOUNTER — Telehealth: Payer: Self-pay

## 2024-05-07 DIAGNOSIS — R269 Unspecified abnormalities of gait and mobility: Secondary | ICD-10-CM

## 2024-05-07 NOTE — Telephone Encounter (Signed)
 Copied from CRM [id number]. Topic: Clinical - Medical Advice >> May 07, 2024  3:59 PM [name] wrote: Reason for CRM: Center well was coming to the home And he went into the hospital his home health  Care stopped and patient is requesting needs a new referral to begin PT/OT again.[name]

## 2024-05-08 LAB — CUP PACEART REMOTE DEVICE CHECK
Battery Remaining Longevity: 7 mo
Battery Voltage: 2.79 V
Brady Statistic AP VP Percent: 0 %
Brady Statistic AP VS Percent: 0 %
Brady Statistic AS VP Percent: 0 %
Brady Statistic AS VS Percent: 0 %
Brady Statistic RA Percent Paced: 0 %
Brady Statistic RV Percent Paced: 99.25 %
Date Time Interrogation Session: 20250722135055
HighPow Impedance: 67 Ohm
Implantable Lead Connection Status: 753985
Implantable Lead Connection Status: 753985
Implantable Lead Implant Date: 20181205
Implantable Lead Implant Date: 20181205
Implantable Lead Location: 753858
Implantable Lead Location: 753860
Implantable Lead Model: 4398
Implantable Pulse Generator Implant Date: 20181205
Lead Channel Impedance Value: 1083 Ohm
Lead Channel Impedance Value: 1083 Ohm
Lead Channel Impedance Value: 1140 Ohm
Lead Channel Impedance Value: 1178 Ohm
Lead Channel Impedance Value: 261.164
Lead Channel Impedance Value: 299.908
Lead Channel Impedance Value: 306.269
Lead Channel Impedance Value: 306.303
Lead Channel Impedance Value: 312.941
Lead Channel Impedance Value: 342 Ohm
Lead Channel Impedance Value: 399 Ohm
Lead Channel Impedance Value: 4047 Ohm
Lead Channel Impedance Value: 513 Ohm
Lead Channel Impedance Value: 532 Ohm
Lead Channel Impedance Value: 722 Ohm
Lead Channel Impedance Value: 760 Ohm
Lead Channel Impedance Value: 874 Ohm
Lead Channel Impedance Value: 950 Ohm
Lead Channel Pacing Threshold Amplitude: 0.5 V
Lead Channel Pacing Threshold Pulse Width: 0.4 ms
Lead Channel Sensing Intrinsic Amplitude: 14.375 mV
Lead Channel Sensing Intrinsic Amplitude: 14.375 mV
Lead Channel Setting Pacing Amplitude: 2.5 V
Lead Channel Setting Pacing Amplitude: 2.5 V
Lead Channel Setting Pacing Pulse Width: 0.4 ms
Lead Channel Setting Pacing Pulse Width: 1 ms
Lead Channel Setting Sensing Sensitivity: 0.3 mV
Zone Setting Status: 755011

## 2024-05-08 NOTE — Telephone Encounter (Signed)
 Ok this is done

## 2024-05-10 ENCOUNTER — Other Ambulatory Visit (HOSPITAL_COMMUNITY): Payer: Self-pay | Admitting: Cardiology

## 2024-05-10 DIAGNOSIS — E7849 Other hyperlipidemia: Secondary | ICD-10-CM

## 2024-05-10 DIAGNOSIS — I2510 Atherosclerotic heart disease of native coronary artery without angina pectoris: Secondary | ICD-10-CM

## 2024-05-12 ENCOUNTER — Other Ambulatory Visit: Payer: Self-pay | Admitting: Internal Medicine

## 2024-05-13 ENCOUNTER — Telehealth (INDEPENDENT_AMBULATORY_CARE_PROVIDER_SITE_OTHER): Admitting: Internal Medicine

## 2024-05-13 ENCOUNTER — Encounter: Payer: Self-pay | Admitting: Internal Medicine

## 2024-05-13 ENCOUNTER — Ambulatory Visit: Attending: Internal Medicine

## 2024-05-13 DIAGNOSIS — E1165 Type 2 diabetes mellitus with hyperglycemia: Secondary | ICD-10-CM | POA: Diagnosis not present

## 2024-05-13 DIAGNOSIS — Z95810 Presence of automatic (implantable) cardiac defibrillator: Secondary | ICD-10-CM | POA: Diagnosis not present

## 2024-05-13 DIAGNOSIS — I5022 Chronic systolic (congestive) heart failure: Secondary | ICD-10-CM

## 2024-05-13 DIAGNOSIS — E559 Vitamin D deficiency, unspecified: Secondary | ICD-10-CM

## 2024-05-13 DIAGNOSIS — R110 Nausea: Secondary | ICD-10-CM | POA: Diagnosis not present

## 2024-05-13 DIAGNOSIS — L03116 Cellulitis of left lower limb: Secondary | ICD-10-CM | POA: Insufficient documentation

## 2024-05-13 MED ORDER — ONDANSETRON 4 MG PO TBDP: 4.0000 mg | ORAL_TABLET | Freq: Three times a day (TID) | ORAL | 1 refills | Status: DC | PRN

## 2024-05-13 MED ORDER — OXYCODONE HCL 5 MG PO TABS: 5.0000 mg | ORAL_TABLET | Freq: Four times a day (QID) | ORAL | 0 refills | Status: DC | PRN

## 2024-05-13 MED ORDER — DOXYCYCLINE HYCLATE 100 MG PO TABS
100.0000 mg | ORAL_TABLET | Freq: Two times a day (BID) | ORAL | 0 refills | Status: DC
Start: 2024-05-13 — End: 2024-06-19

## 2024-05-13 NOTE — Progress Notes (Addendum)
 Patient ID: [name], male   DOB: 1956/06/01, 68 y.o.   MRN: [id number]  Virtual Visit via Video Note  I connected with [name] on 05/13/24 at  2:20 PM EDT by a video enabled telemedicine application and verified that I am speaking with the correct person using two identifiers.  Location of all participants today Patient: at home Provider:  at office   I discussed the limitations of evaluation and management by telemedicine and the availability of in person appointments. The patient expressed understanding and agreed to proceed.  History of Present Illness: Here to f/u after recent 5 day hospn then rehab stay, now home for over 1 wk.  Left leg cellulitis has returned with pain, redness , swelling, assoc with some nausea, no vomiting.  Pain has been worsening as well, now 8/10, tramadol  not controlled.  Pt denies chest pain, increased sob or doe, wheezing, orthopnea, PND, increased LE swelling, palpitations, dizziness or syncope.   Pt denies polydipsia, polyuria, or new focal neuro s/s.    Past Medical History:  Diagnosis Date   AICD (automatic cardioverter/defibrillator) present    Anemia    supposed to be taking Vit B but doesn't   ANXIETY    takes Xanax  nightly   Arthritis    Asthma    Albuterol  prn and Advair  daily;also takes Prednisone  daily   Cardiomyopathy [facility])    a. EF 25% TEE July 2013; b. EF normalized 2015;  c. 03/2015 Echo: EF 40-45%, difrf HK, PASP 38 mmHg, Mild MR, sev LAE/RAE.   Chronic constipation    takes OTC stool softener   COPD (chronic obstructive pulmonary disease) (HCC)    one dr says COPD; one dr says emphysema (09/18/2017)   COVID-19 12/09/2019   DEPRESSION    takes Zoloft  and Doxepin  daily   Diverticulitis    DYSKINESIA, ESOPHAGUS    Essential hypertension        FIBROMYALGIA    GERD (gastroesophageal reflux disease)        Glaucoma    HYPERLIPIDEMIA    a. Intolerant to statins.   INSOMNIA    takes Ambien  nightly   Myocardial  infarction [facility])    a. 2012 Myoview  notable for prior infarct;  b. 03/2015 Lexiscan  CL: EF 37%, diff HK, small area of inferior infarct from apex to base-->Med Rx.   O2 dependent    2.5L q hs & prn (09/18/2017)   Paroxysmal atrial fibrillation (HCC)    a. CHA2DS2VASc = 3--> takes Coumadin ;  b. 03/15/2015 Successful TEE/DCCV;  c. 03/2015 recurrent afib, Amio d/c'd in setting of hyperthyroidism.   Peripheral neuropathy    Pneumonia 12/2016   Rash and other nonspecific skin eruption 04/12/2009   no cause found saw dermatologists x 2 and allergist   SLEEP APNEA, OBSTRUCTIVE    a. doesn't use CPAP   Syncope    a. 03/2015 s/p MDT LINQ.   Type II diabetes mellitus (HCC)        Past Surgical History:  Procedure Laterality Date   ACNE CYST REMOVAL     2 on back    AV NODE ABLATION N/A 10/25/2017   Procedure: AV NODE ABLATION;  Surgeon: Fernande Elspeth BROCKS, MD;  Location: [facility] INVASIVE CV LAB;  Service: Cardiovascular;  Laterality: N/A;   BIV ICD INSERTION CRT-D N/A 09/18/2017   Procedure: BIV ICD INSERTION CRT-D;  Surgeon: Fernande Elspeth BROCKS, MD;  Location: [facility] INVASIVE CV LAB;  Service: Cardiovascular;  Laterality: N/A;   CARDIAC CATHETERIZATION  N/A 03/21/2016   Procedure: Right/Left Heart Cath and Coronary Angiography;  Surgeon: Ezra GORMAN Shuck, MD;  Location: [facility] INVASIVE CV LAB;  Service: Cardiovascular;  Laterality: N/A;   CARDIOVERSION  04/18/2012   Procedure: CARDIOVERSION;  Surgeon: Vina LULLA Gull, MD;  Location: [facility] OR;  Service: Cardiovascular;  Laterality: N/A;   CARDIOVERSION  04/25/2012   Procedure: CARDIOVERSION;  Surgeon: Aleene JINNY Passe, MD;  Location: [facility] ENDOSCOPY;  Service: Cardiovascular;  Laterality: N/A;   CARDIOVERSION  04/25/2012   Procedure: CARDIOVERSION;  Surgeon: Vina LULLA Gull, MD;  Location: [facility] OR;  Service: Cardiovascular;  Laterality: N/A;   CARDIOVERSION  05/09/2012   Procedure: CARDIOVERSION;  Surgeon: Ozell Fell, MD;  Location: [facility] OR;  Service: Cardiovascular;  Laterality: N/A;   changed from crenshaw to cooper by trish/leone-endo   CARDIOVERSION N/A 03/15/2015   Procedure: CARDIOVERSION;  Surgeon: Aleene JINNY Passe, MD;  Location: [facility] ENDOSCOPY;  Service: Cardiovascular;  Laterality: N/A;   COLONOSCOPY     COLONOSCOPY WITH PROPOFOL  N/A 10/21/2014   Procedure: COLONOSCOPY WITH PROPOFOL ;  Surgeon: Gwendlyn ONEIDA Buddy, MD;  Location: [facility] ENDOSCOPY;  Service: Endoscopy;  Laterality: N/A;   EP IMPLANTABLE DEVICE N/A 04/06/2015   Procedure: Loop Recorder Insertion;  Surgeon: Danelle LELON Birmingham, MD;  Location: [facility] INVASIVE CV LAB;  Service: Cardiovascular;  Laterality: N/A;   ESOPHAGOGASTRODUODENOSCOPY     JOINT REPLACEMENT     LOOP RECORDER REMOVAL N/A 09/18/2017   Procedure: LOOP RECORDER REMOVAL;  Surgeon: Fernande Elspeth BROCKS, MD;  Location: [facility] INVASIVE CV LAB;  Service: Cardiovascular;  Laterality: N/A;   RIGHT/LEFT HEART CATH AND CORONARY ANGIOGRAPHY N/A 01/28/2017   Procedure: Right/Left Heart Cath and Coronary Angiography;  Surgeon: Ezra GORMAN Shuck, MD;  Location: [facility] INVASIVE CV LAB;  Service: Cardiovascular;  Laterality: N/A;   TEE WITHOUT CARDIOVERSION  04/25/2012   Procedure: TRANSESOPHAGEAL ECHOCARDIOGRAM (TEE);  Surgeon: Aleene JINNY Passe, MD;  Location: [facility] ENDOSCOPY;  Service: Cardiovascular;  Laterality: N/A;   TEE WITHOUT CARDIOVERSION N/A 03/15/2015   Procedure: TRANSESOPHAGEAL ECHOCARDIOGRAM (TEE);  Surgeon: Aleene JINNY Passe, MD;  Location: [facility] ENDOSCOPY;  Service: Cardiovascular;  Laterality: N/A;   TONSILLECTOMY AND ADENOIDECTOMY     TOTAL KNEE ARTHROPLASTY Right 06/15/2014   Procedure: TOTAL KNEE ARTHROPLASTY;  Surgeon: Evalene JONETTA Chancy, MD;  Location: [facility] OR;  Service: Orthopedics;  Laterality: Right;   TOTAL KNEE ARTHROPLASTY Left 10/17/2021   Procedure: Left TOTAL KNEE ARTHROPLASTY;  Surgeon: Vernetta Lonni GRADE, MD;  Location: [facility] OR;  Service: Orthopedics;  Laterality: Left;    reports that he quit smoking about 16 years ago. His smoking use included cigarettes. He started  smoking about 46 years ago. He has a 60 pack-year smoking history. He has never used smokeless tobacco. He reports that he does not drink alcohol and does not use drugs. family history includes Allergies in his mother; Asthma in his maternal grandmother and mother; COPD in his mother; Colon polyps in his mother; Hypothyroidism in his mother. Allergies  Allergen Reactions   Amiodarone  Other (See Comments)    Caused hyperthyroidism    Statins Other (See Comments)    Myalgias    Wound Dressing Adhesive Other (See Comments)    Skin Tears Use Paper Tape Only   Current Outpatient Medications on File Prior to Visit  Medication Sig Dispense Refill   acetaminophen  (TYLENOL ) 325 MG tablet Take 2 tablets (650 mg total) by mouth every 6 (six) hours as needed for fever or mild pain (pain score 1-3).  albuterol  (VENTOLIN  HFA) 108 (90 Base) MCG/ACT inhaler INHALE 1-2 PUFFS BY MOUTH EVERY 6 HOURS AS NEEDED FOR WHEEZE OR SHORTNESS OF BREATH 8.5 each 5   ammonium lactate  (AMLACTIN) 12 % cream Apply 1 Application topically as needed for dry skin. 385 g 3   apixaban  (ELIQUIS ) 5 MG TABS tablet Take 1 tablet (5 mg total) by mouth 2 (two) times daily. 60 tablet 11   ascorbic acid (VITAMIN C) 500 MG tablet Take 500 mg by mouth daily.     augmented betamethasone  dipropionate (DIPROLENE -AF) 0.05 % cream Apply 1 application  topically daily as needed (to affected areas- for itching).     bisacodyl  5 MG EC tablet Take 1 tablet (5 mg total) by mouth daily as needed for moderate constipation or mild constipation (if no bowel movement in two days on Linzess ). 30 tablet 0   Cholecalciferol  (VITAMIN D3) 50 MCG (2000 UT) TABS Take 2,000 Units by mouth daily.     Cyanocobalamin  (VITAMIN B-12) 2500 MCG SUBL Place 2,500 mcg under the tongue daily.     docusate sodium  (COLACE) 100 MG capsule Take 1 capsule (100 mg total) by mouth 2 (two) times daily as needed for mild constipation.     doxepin  (SINEQUAN ) 10 MG capsule Take 2  capsules (20 mg total) by mouth at bedtime. 180 capsule 1   DULoxetine  (CYMBALTA ) 60 MG capsule TAKE 1 CAPSULE BY MOUTH EVERY DAY 90 capsule 2   Dupilumab (DUPIXENT) 300 MG/2ML SOPN Inject 300 mg into the skin every 14 (fourteen) days.     esomeprazole  (NEXIUM ) 40 MG capsule Take 1 capsule (40 mg total) by mouth daily at 12 noon. 30 capsule 2   Evolocumab  (REPATHA  SURECLICK) 140 MG/ML SOAJ INJECT 140 MG INTO THE SKIN EVERY 14 DAYS 6 mL 3   fluticasone  (FLONASE ) 50 MCG/ACT nasal spray Place 2 sprays into both nostrils daily as needed for allergies or rhinitis. 48 mL 1   gabapentin  (NEURONTIN ) 100 MG capsule Take 1 capsule (100 mg total) by mouth at bedtime.     ipratropium-albuterol  (DUONEB) 0.5-2.5 (3) MG/3ML SOLN TAKE 3 ML BY NEBULIZATION EVERY 4 HOURS AS NEEDED (WHEEZING). 360 mL 0   leptospermum manuka honey (MEDIHONEY) PSTE paste Apply 1 Application topically daily. 44 mL 2   lidocaine  (LIDODERM ) 5 % Place 1 patch onto the skin daily. Remove & Discard patch within 12 hours or as directed by MD 30 patch 2   linaclotide  (LINZESS ) 145 MCG CAPS capsule Take 1 capsule (145 mcg total) by mouth daily before breakfast. 30 capsule 2   linaclotide  (LINZESS ) 145 MCG CAPS capsule Take 1 capsule (145 mcg total) by mouth daily before breakfast. 90 capsule 3   magnesium  hydroxide (MILK OF MAGNESIA) 400 MG/5ML suspension Take 5 mLs by mouth daily as needed for mild constipation. 355 mL 0   meclizine  (ANTIVERT ) 25 MG tablet Take 1 tablet (25 mg total) by mouth 3 (three) times daily as needed for dizziness. 30 tablet 0   Multiple Vitamins-Minerals (SENIOR VITES PO) Take 1 tablet by mouth daily with breakfast.     omeprazole  (PRILOSEC) 20 MG capsule TAKE 1 CAPSULE BY MOUTH TWICE A DAY 180 capsule 1   potassium chloride  SA (KLOR-CON  M) 20 MEQ tablet Take 1 tablet (20 mEq total) by mouth daily. 90 tablet 3   predniSONE  (DELTASONE ) 10 MG tablet Take 10 mg by mouth every morning.     tamsulosin  (FLOMAX ) 0.4 MG CAPS  capsule Take 1 capsule (0.4 mg total) by  mouth daily. 30 capsule 0   Tiotropium Bromide  Monohydrate (SPIRIVA  RESPIMAT) 2.5 MCG/ACT AERS Take 2 puffs by nebulization daily. 4 g 3   tirzepatide  (MOUNJARO ) 7.5 MG/0.5ML Pen Inject 7.5 mg into the skin once a week. 6 mL 3   tiZANidine  (ZANAFLEX ) 4 MG tablet Take 1 tablet (4 mg total) by mouth at bedtime as needed for muscle spasms. 10 tablet 0   torsemide  (DEMADEX ) 20 MG tablet Take 3 tablets (60 mg total) by mouth 2 (two) times daily. 540 tablet 2   traMADol  (ULTRAM ) 50 MG tablet Take 1 tablet (50 mg total) by mouth every 8 (eight) hours as needed for moderate pain (pain score 4-6). 15 tablet 0   traZODone  (DESYREL ) 100 MG tablet TAKE 2 TABLETS BY MOUTH AT BEDTIME AS NEEDED 180 tablet 1   TUMS 500 MG chewable tablet Chew 1 tablet by mouth 2 (two) times daily as needed for indigestion or heartburn.     venlafaxine  XR (EFFEXOR -XR) 150 MG 24 hr capsule TAKE 1 CAPSULE BY MOUTH DAILY WITH BREAKFAST. 90 capsule 3   WIXELA INHUB 250-50 MCG/ACT AEPB INHALE 1 PUFF INTO THE LUNGS IN THE MORNING AND AT BEDTIME. 60 each 8   No current facility-administered medications on file prior to visit.    Observations/Objective: Alert, NAD, appropriate mood and affect, resps normal, cn 2-12 intact, moves all 4s, no visible rash but has large area LLE just above the ankle approx 6 cm red, tender Lab Results  Component Value Date   WBC 10.8 (H) 05/04/2024   HGB 12.1 (L) 05/04/2024   HCT 38.3 (L) 05/04/2024   PLT 414 (H) 05/04/2024   GLUCOSE 106 (H) 05/04/2024   CHOL 130 01/07/2024   TRIG 139.0 01/07/2024   HDL 52.40 01/07/2024   LDLDIRECT 72.0 02/28/2021   LDLCALC 49 01/07/2024   ALT 18 05/04/2024   AST 27 05/04/2024   NA 138 05/04/2024   K 3.3 (L) 05/04/2024   CL 98 05/04/2024   CREATININE 0.92 05/04/2024   BUN 12 05/04/2024   CO2 26 05/04/2024   TSH 2.02 01/07/2024   PSA 3.46 01/07/2024   INR 1.3 (H) 05/04/2024   HGBA1C 6.0 01/07/2024   MICROALBUR <0.7  01/07/2024   Assessment and Plan: See notes  Follow Up Instructions: See notes   I discussed the assessment and treatment plan with the patient. The patient was provided an opportunity to ask questions and all were answered. The patient agreed with the plan and demonstrated an understanding of the instructions.   The patient was advised to call back or seek an in-person evaluation if the symptoms worsen or if the condition fails to improve as anticipated.  Lynwood Rush, MD

## 2024-05-13 NOTE — Patient Instructions (Signed)
 Please take all new medication as prescribed

## 2024-05-13 NOTE — Assessment & Plan Note (Signed)
 Mild to mod, for zofran  odt prn,  to f/u any worsening symptoms or concerns

## 2024-05-13 NOTE — Assessment & Plan Note (Signed)
 Last vitamin D  Lab Results  Component Value Date   VD25OH 29.38 (L) 01/07/2024   Low, to start  oral replacement

## 2024-05-13 NOTE — Assessment & Plan Note (Signed)
 Mild to mod, for antibx course doxycycline  100 bid, oxycodone  5 mg prn refill,  to f/u any worsening symptoms or concerns

## 2024-05-13 NOTE — Assessment & Plan Note (Signed)
 Lab Results  Component Value Date   HGBA1C 6.0 01/07/2024   Stable, pt to continue current medical treatment mounjaro 7.5 mg weekly

## 2024-05-14 NOTE — Progress Notes (Signed)
 [name] [address] [place] [address] Phone: [phone] Subjective:   [name], am serving as a scribe for Dr. Arthea Claudene.  I'm seeing this patient by the request  of:  Norleen Lynwood ORN, MD  CC: Bilateral shoulder pain  YEP:Dlagzrupcz  03/24/2024 Bilateral injections given today.  Discussed icing regimen and home exercises, discussed avoiding certain activities.  Increase activity slowly.  Discussed icing regimen.  Follow-up again in 6 to 8 weeks.     Chronic problem with worsening symptoms.  Making it difficult to do activities of daily living secondary to the weakness patient has secondary to this.  Discussed which activities to do and which ones to avoid.  Increase activity slowly otherwise.  Can get repeat injections every 8 weeks if necessary.     Updated 05/19/2024 [name] is a 68 y.o. male coming in with complaint of B shoulder pain. Patient states that his R hand is painful on back of hand.   Here for B shoulder injections.   [facility]       Past Medical History:  Diagnosis Date   AICD (automatic cardioverter/defibrillator) present    Anemia    supposed to be taking Vit B but doesn't   ANXIETY    takes Xanax  nightly   Arthritis    Asthma    Albuterol  prn and Advair  daily;also takes Prednisone  daily   Cardiomyopathy [facility])    a. EF 25% TEE July 2013; b. EF normalized 2015;  c. 03/2015 Echo: EF 40-45%, difrf HK, PASP 38 mmHg, Mild MR, sev LAE/RAE.   Chronic constipation    takes OTC stool softener   COPD (chronic obstructive pulmonary disease) (HCC)    one dr says COPD; one dr says emphysema (09/18/2017)   COVID-19 12/09/2019   DEPRESSION    takes Zoloft  and Doxepin  daily   Diverticulitis    DYSKINESIA, ESOPHAGUS    Essential hypertension        FIBROMYALGIA    GERD (gastroesophageal reflux disease)        Glaucoma    HYPERLIPIDEMIA    a. Intolerant to statins.   INSOMNIA    takes  Ambien  nightly   Myocardial infarction [facility])    a. 2012 Myoview  notable for prior infarct;  b. 03/2015 Lexiscan  CL: EF 37%, diff HK, small area of inferior infarct from apex to base-->Med Rx.   O2 dependent    2.5L q hs & prn (09/18/2017)   Paroxysmal atrial fibrillation (HCC)    a. CHA2DS2VASc = 3--> takes Coumadin ;  b. 03/15/2015 Successful TEE/DCCV;  c. 03/2015 recurrent afib, Amio d/c'd in setting of hyperthyroidism.   Peripheral neuropathy    Pneumonia 12/2016   Rash and other nonspecific skin eruption 04/12/2009   no cause found saw dermatologists x 2 and allergist   SLEEP APNEA, OBSTRUCTIVE    a. doesn't use CPAP   Syncope    a. 03/2015 s/p MDT LINQ.   Type II diabetes mellitus (HCC)        Past Surgical History:  Procedure Laterality Date   ACNE CYST REMOVAL     2 on back    AV NODE ABLATION N/A 10/25/2017   Procedure: AV NODE ABLATION;  Surgeon: Fernande Elspeth BROCKS, MD;  Location: [facility] INVASIVE CV LAB;  Service: Cardiovascular;  Laterality: N/A;   BIV ICD INSERTION CRT-D N/A 09/18/2017   Procedure: BIV ICD INSERTION CRT-D;  Surgeon: Fernande Elspeth BROCKS, MD;  Location: [facility]  INVASIVE CV LAB;  Service: Cardiovascular;  Laterality: N/A;   CARDIAC CATHETERIZATION N/A 03/21/2016   Procedure: Right/Left Heart Cath and Coronary Angiography;  Surgeon: Ezra GORMAN Shuck, MD;  Location: [facility] INVASIVE CV LAB;  Service: Cardiovascular;  Laterality: N/A;   CARDIOVERSION  04/18/2012   Procedure: CARDIOVERSION;  Surgeon: Vina LULLA Gull, MD;  Location: [facility] OR;  Service: Cardiovascular;  Laterality: N/A;   CARDIOVERSION  04/25/2012   Procedure: CARDIOVERSION;  Surgeon: Aleene JINNY Passe, MD;  Location: [facility] ENDOSCOPY;  Service: Cardiovascular;  Laterality: N/A;   CARDIOVERSION  04/25/2012   Procedure: CARDIOVERSION;  Surgeon: Vina LULLA Gull, MD;  Location: [facility] OR;  Service: Cardiovascular;  Laterality: N/A;   CARDIOVERSION  05/09/2012   Procedure: CARDIOVERSION;  Surgeon: Ozell Fell, MD;  Location: [facility] [place] [facility] OR;  Service:  Cardiovascular;  Laterality: N/A;  changed from crenshaw to cooper by trish/leone-endo   CARDIOVERSION N/A 03/15/2015   Procedure: CARDIOVERSION;  Surgeon: Aleene JINNY Passe, MD;  Location: [facility] ENDOSCOPY;  Service: Cardiovascular;  Laterality: N/A;   COLONOSCOPY     COLONOSCOPY WITH PROPOFOL  N/A 10/21/2014   Procedure: COLONOSCOPY WITH PROPOFOL ;  Surgeon: Gwendlyn ONEIDA Buddy, MD;  Location: [facility] ENDOSCOPY;  Service: Endoscopy;  Laterality: N/A;   EP IMPLANTABLE DEVICE N/A 04/06/2015   Procedure: Loop Recorder Insertion;  Surgeon: Danelle LELON Birmingham, MD;  Location: [facility] INVASIVE CV LAB;  Service: Cardiovascular;  Laterality: N/A;   ESOPHAGOGASTRODUODENOSCOPY     JOINT REPLACEMENT     LOOP RECORDER REMOVAL N/A 09/18/2017   Procedure: LOOP RECORDER REMOVAL;  Surgeon: Fernande Elspeth BROCKS, MD;  Location: [facility] INVASIVE CV LAB;  Service: Cardiovascular;  Laterality: N/A;   RIGHT/LEFT HEART CATH AND CORONARY ANGIOGRAPHY N/A 01/28/2017   Procedure: Right/Left Heart Cath and Coronary Angiography;  Surgeon: Ezra GORMAN Shuck, MD;  Location: [facility] INVASIVE CV LAB;  Service: Cardiovascular;  Laterality: N/A;   TEE WITHOUT CARDIOVERSION  04/25/2012   Procedure: TRANSESOPHAGEAL ECHOCARDIOGRAM (TEE);  Surgeon: Aleene JINNY Passe, MD;  Location: [facility] ENDOSCOPY;  Service: Cardiovascular;  Laterality: N/A;   TEE WITHOUT CARDIOVERSION N/A 03/15/2015   Procedure: TRANSESOPHAGEAL ECHOCARDIOGRAM (TEE);  Surgeon: Aleene JINNY Passe, MD;  Location: [facility] ENDOSCOPY;  Service: Cardiovascular;  Laterality: N/A;   TONSILLECTOMY AND ADENOIDECTOMY     TOTAL KNEE ARTHROPLASTY Right 06/15/2014   Procedure: TOTAL KNEE ARTHROPLASTY;  Surgeon: Evalene JONETTA Chancy, MD;  Location: [facility] OR;  Service: Orthopedics;  Laterality: Right;   TOTAL KNEE ARTHROPLASTY Left 10/17/2021   Procedure: Left TOTAL KNEE ARTHROPLASTY;  Surgeon: Vernetta Lonni GRADE, MD;  Location: [facility] OR;  Service: Orthopedics;  Laterality: Left;   Social History   Socioeconomic History   Marital status: Divorced     Spouse name: Not on file   Number of children: 2   Years of education: Not on file   Highest education level: GED or equivalent  Occupational History   Occupation: retired/disabled. prev worked in Teacher, English as a foreign language.    Employer: DISABLED  Tobacco Use   Smoking status: Former    Current packs/day: 0.00    Average packs/day: 2.0 packs/day for 30.0 years (60.0 ttl pk-yrs)    Types: Cigarettes    Start date: 10/15/1977    Quit date: 10/16/2007    Years since quitting: 16.6   Smokeless tobacco: Never  Vaping Use   Vaping status: Never Used  Substance and Sexual Activity   Alcohol use: No   Drug use: No   Sexual activity: Not Currently  Other Topics Concern   Not on file  Social History Narrative   Lives with girlfriend   Social Drivers of Health   Financial Resource Strain: Medium Risk (01/01/2024)   Overall Financial Resource Strain (CARDIA)    Difficulty of Paying Living Expenses: Somewhat hard  Food Insecurity: No Food Insecurity (04/13/2024)   Hunger Vital Sign    Worried About Running Out of Food in the Last Year: Never true    Ran Out of Food in the Last Year: Never true  Transportation Needs: No Transportation Needs (04/13/2024)   PRAPARE - Administrator, Civil Service (Medical): No    Lack of Transportation (Non-Medical): No  Physical Activity: Inactive (01/01/2024)   Exercise Vital Sign    Days of Exercise per Week: 0 days    Minutes of Exercise per Session: 0 min  Stress: No Stress Concern Present (01/01/2024)   [employer] of Occupational Health - Occupational Stress Questionnaire    Feeling of Stress : Only a little  Social Connections: Socially Isolated (04/13/2024)   Social Connection and Isolation Panel    Frequency of Communication with Friends and Family: Once a week    Frequency of Social Gatherings with Friends and Family: Never    Attends Religious Services: Never    Database administrator or Organizations: No    Attends Museum/gallery exhibitions officer: Never    Marital Status: Divorced   Allergies  Allergen Reactions   Amiodarone  Other (See Comments)    Caused hyperthyroidism    Statins Other (See Comments)    Myalgias    Wound Dressing Adhesive Other (See Comments)    Skin Tears Use Paper Tape Only   Family History  Problem Relation Age of Onset   COPD Mother    Asthma Mother    Colon polyps Mother    Allergies Mother    Hypothyroidism Mother    Asthma Maternal Grandmother    Colon cancer Neg Hx     Current Outpatient Medications (Endocrine & Metabolic):    predniSONE  (DELTASONE ) 10 MG tablet, Take 10 mg by mouth every morning.   tirzepatide  (MOUNJARO ) 7.5 MG/0.5ML Pen, Inject 7.5 mg into the skin once a week.  Current Outpatient Medications (Cardiovascular):    Evolocumab  (REPATHA  SURECLICK) 140 MG/ML SOAJ, INJECT 140 MG INTO THE SKIN EVERY 14 DAYS   torsemide  (DEMADEX ) 20 MG tablet, Take 4 tablets (80 mg total) by mouth 2 (two) times daily.  Current Outpatient Medications (Respiratory):    albuterol  (VENTOLIN  HFA) 108 (90 Base) MCG/ACT inhaler, INHALE 1-2 PUFFS BY MOUTH EVERY 6 HOURS AS NEEDED FOR WHEEZE OR SHORTNESS OF BREATH   fluticasone  (FLONASE ) 50 MCG/ACT nasal spray, Place 2 sprays into both nostrils daily as needed for allergies or rhinitis.   ipratropium-albuterol  (DUONEB) 0.5-2.5 (3) MG/3ML SOLN, TAKE 3 ML BY NEBULIZATION EVERY 4 HOURS AS NEEDED (WHEEZING).   Tiotropium Bromide  Monohydrate (SPIRIVA  RESPIMAT) 2.5 MCG/ACT AERS, Take 2 puffs by nebulization daily.   WIXELA INHUB 250-50 MCG/ACT AEPB, INHALE 1 PUFF INTO THE LUNGS IN THE MORNING AND AT BEDTIME.  Current Outpatient Medications (Analgesics):    acetaminophen  (TYLENOL ) 325 MG tablet, Take 2 tablets (650 mg total) by mouth every 6 (six) hours as needed for fever or mild pain (pain score 1-3).   oxyCODONE  (ROXICODONE ) 5 MG immediate release tablet, Take 1 tablet (5 mg total) by mouth every 6 (six) hours as needed for severe pain  (pain score 7-10).   traMADol  (ULTRAM ) 50 MG tablet, Take 1 tablet (50 mg total) by mouth  every 8 (eight) hours as needed for moderate pain (pain score 4-6).  Current Outpatient Medications (Hematological):    apixaban  (ELIQUIS ) 5 MG TABS tablet, Take 1 tablet (5 mg total) by mouth 2 (two) times daily.   Cyanocobalamin  (VITAMIN B-12) 2500 MCG SUBL, Place 2,500 mcg under the tongue daily.  Current Outpatient Medications (Other):    ammonium lactate  (AMLACTIN) 12 % cream, Apply 1 Application topically as needed for dry skin.   ascorbic acid (VITAMIN C) 500 MG tablet, Take 500 mg by mouth daily.   augmented betamethasone  dipropionate (DIPROLENE -AF) 0.05 % cream, Apply 1 application  topically daily as needed (to affected areas- for itching).   bisacodyl  5 MG EC tablet, Take 1 tablet (5 mg total) by mouth daily as needed for moderate constipation or mild constipation (if no bowel movement in two days on Linzess ).   Cholecalciferol  (VITAMIN D3) 50 MCG (2000 UT) TABS, Take 2,000 Units by mouth daily.   docusate sodium  (COLACE) 100 MG capsule, Take 1 capsule (100 mg total) by mouth 2 (two) times daily as needed for mild constipation.   doxepin  (SINEQUAN ) 10 MG capsule, Take 2 capsules (20 mg total) by mouth at bedtime.   doxycycline  (VIBRA -TABS) 100 MG tablet, Take 1 tablet (100 mg total) by mouth 2 (two) times daily.   DULoxetine  (CYMBALTA ) 60 MG capsule, TAKE 1 CAPSULE BY MOUTH EVERY DAY   Dupilumab (DUPIXENT) 300 MG/2ML SOPN, Inject 300 mg into the skin every 14 (fourteen) days.   esomeprazole  (NEXIUM ) 40 MG capsule, Take 1 capsule (40 mg total) by mouth daily at 12 noon.   gabapentin  (NEURONTIN ) 100 MG capsule, Take 1 capsule (100 mg total) by mouth at bedtime.   leptospermum manuka honey (MEDIHONEY) PSTE paste, Apply 1 Application topically daily.   lidocaine  (LIDODERM ) 5 %, Place 1 patch onto the skin daily. Remove & Discard patch within 12 hours or as directed by MD   linaclotide  (LINZESS ) 145  MCG CAPS capsule, Take 1 capsule (145 mcg total) by mouth daily before breakfast.   linaclotide  (LINZESS ) 145 MCG CAPS capsule, Take 1 capsule (145 mcg total) by mouth daily before breakfast.   magnesium  hydroxide (MILK OF MAGNESIA) 400 MG/5ML suspension, Take 5 mLs by mouth daily as needed for mild constipation.   meclizine  (ANTIVERT ) 25 MG tablet, Take 1 tablet (25 mg total) by mouth 3 (three) times daily as needed for dizziness.   Multiple Vitamins-Minerals (SENIOR VITES PO), Take 1 tablet by mouth daily with breakfast.   omeprazole  (PRILOSEC) 20 MG capsule, TAKE 1 CAPSULE BY MOUTH TWICE A DAY   ondansetron  (ZOFRAN -ODT) 4 MG disintegrating tablet, Take 1 tablet (4 mg total) by mouth every 8 (eight) hours as needed.   potassium chloride  SA (KLOR-CON  M) 20 MEQ tablet, Take 3 tablets (60 mEq total) by mouth daily.   tamsulosin  (FLOMAX ) 0.4 MG CAPS capsule, Take 1 capsule (0.4 mg total) by mouth daily.   tiZANidine  (ZANAFLEX ) 4 MG tablet, Take 1 tablet (4 mg total) by mouth at bedtime as needed for muscle spasms.   traZODone  (DESYREL ) 100 MG tablet, TAKE 2 TABLETS BY MOUTH AT BEDTIME AS NEEDED   TUMS 500 MG chewable tablet, Chew 1 tablet by mouth 2 (two) times daily as needed for indigestion or heartburn.   venlafaxine  XR (EFFEXOR -XR) 150 MG 24 hr capsule, TAKE 1 CAPSULE BY MOUTH DAILY WITH BREAKFAST.   Reviewed prior external information including notes and imaging from  primary care provider As well as notes that were available from  care everywhere and other healthcare systems.  Past medical history, social, surgical and family history all reviewed in electronic medical record.  No pertanent information unless stated regarding to the chief complaint.   Review of Systems:  No headache, visual changes, nausea, vomiting, diarrhea, constipation, dizziness, abdominal pain, skin rash, fevers, chills, night sweats, weight loss, swollen lymph nodes, body aches, chest pain, shortness of breath, mood  changes. POSITIVE muscle aches, joint swelling  Objective  Blood pressure (!) 122/96, pulse 78, height 5' 10 (1.778 m), weight 240 lb (108.9 kg), SpO2 93%.   General: No apparent distress alert and oriented x3 mood and affect normal, dressed appropriately.  HEENT: Pupils equal, extraocular movements intact  Respiratory: Wearing oxygen  with a nasal cannula In a wheelchair Severe arthritic changes of the shoulders bilaterally.  Severe crepitus noted.  Procedure: Real-time Ultrasound Guided Injection of right glenohumeral joint Device: GE Logiq Q7  Ultrasound guided injection is preferred based studies that show increased duration, increased effect, greater accuracy, decreased procedural pain, increased response rate with ultrasound guided versus blind injection.  Verbal informed consent obtained.  Time-out conducted.  Noted no overlying erythema, induration, or other signs of local infection.  Skin prepped in a sterile fashion.  Local anesthesia: Topical Ethyl chloride.  With sterile technique and under real time ultrasound guidance:  Joint visualized.  23g 1  inch needle inserted posterior approach. Pictures taken for needle placement. Patient did have injection of  2 cc of 0.5% Marcaine , and 1.0 cc of Kenalog  40 mg/dL. Completed without difficulty  Pain immediately resolved suggesting accurate placement of the medication.  Advised to call if fevers/chills, erythema, induration, drainage, or persistent bleeding.  Images stored Impression: Technically successful ultrasound guided injection.  Procedure: Real-time Ultrasound Guided Injection of left glenohumeral joint Device: GE Logiq E  Ultrasound guided injection is preferred based studies that show increased duration, increased effect, greater accuracy, decreased procedural pain, increased response rate with ultrasound guided versus blind injection.  Verbal informed consent obtained.  Time-out conducted.  Noted no overlying erythema,  induration, or other signs of local infection.  Skin prepped in a sterile fashion.  Local anesthesia: Topical Ethyl chloride.  With sterile technique and under real time ultrasound guidance:  Joint visualized.  21g 2 inch needle inserted posterior approach. Pictures taken for needle placement. Patient did have injection of 2 cc of 0.5% Marcaine , and 1cc of Kenalog  40 mg/dL. Completed without difficulty  Pain immediately resolved suggesting accurate placement of the medication.  Advised to call if fevers/chills, erythema, induration, drainage, or persistent bleeding.  Images permanently stored Impression: Technically successful ultrasound guided injection.  Procedure: Real-time Ultrasound Guided Injection of right acromioclavicular joint Device: GE Logiq Q7 Ultrasound guided injection is preferred based studies that show increased duration, increased effect, greater accuracy, decreased procedural pain, increased response rate, and decreased cost with ultrasound guided versus blind injection.  Verbal informed consent obtained.  Time-out conducted.  Noted no overlying erythema, induration, or other signs of local infection.  Skin prepped in a sterile fashion.  Local anesthesia: Topical Ethyl chloride.  With sterile technique and under real time ultrasound guidance: With a 25-gauge half inch needle injected with 0.5 cc and 0.5 cc of Kenalog  40 mg/mL Completed without difficulty  Pain immediately resolved suggesting accurate placement of the medication.  Advised to call if fevers/chills, erythema, induration, drainage, or persistent bleeding.  Images saved Impression: Technically successful ultrasound guided injection.  Procedure: Real-time Ultrasound Guided Injection of left acromioclavicular joint Device: GE Logiq Q7  Ultrasound guided injection is preferred based studies that show increased duration, increased effect, greater accuracy, decreased procedural pain, increased response rate, and  decreased cost with ultrasound guided versus blind injection.  Verbal informed consent obtained.  Time-out conducted.  Noted no overlying erythema, induration, or other signs of local infection.  Skin prepped in a sterile fashion.  Local anesthesia: Topical Ethyl chloride.  With sterile technique and under real time ultrasound guidance: With a 25-gauge half inch needle patient injected with 0.5 cc 0.5% Marcaine  and 0.5 cc of Kenalog  40 mg/mL Completed without difficulty  Pain immediately resolved suggesting accurate placement of the medication.  Advised to call if fevers/chills, erythema, induration, drainage, or persistent bleeding.  Images saved Impression: Technically successful ultrasound guided injection.   Impression and Recommendations:    The above documentation has been reviewed and is accurate and complete Champayne Kocian M Johany Hansman, DO

## 2024-05-14 NOTE — Progress Notes (Unsigned)
 EPIC Encounter for ICM Monitoring  Patient Name: [name] is a 68 y.o. male Date: 05/14/2024 Primary Care Physican: Norleen Lynwood ORN, MD Primary Cardiologist: Rolan Electrophysiologist: Fernande Pore Pacing: 97.3%    11/04/2023 Office Weight: 285 lbs 05/14/2024 Home Weight: 268 lbs   Battery Longevity: 5 months     Spoke with patient and heart failure questions reviewed.  Transmission results reviewed.  Pt has returned home from rehab stay of 3 weeks.    ED visit 7/21 for dizziness.  Pt reports following fluid symptoms:  Weight gain: None SOB:  Always SOB d/t COPD and wears O2 24/7 Difficulty sleeping at night:  No Swelling: Right foot swollen and sore on 2nd toe Diet/fluid intake changes:  Tries to limit salt intake.  Limits fluid intake to 64 oz daily Thinks reason for fluid accumulation is r/t several hospitalizations and rehab stays   Optivol thoracic impedance suggesting possible fluid accumulation starting 4/12.  Fluid index >normal threshold since 4/25.   Prescribed:  Torsemide  20 mg 3 tablet(s) (60 mg total) by mouth twice a day.  Confirmed      Labs: 05/04/2024 Creatinine 0.92, BUN 12, Potassium 3.3, Sodium 138, GFR >60  04/17/2024 Creatinine 1.24, BUN 16, Potassium 3.2, Sodium 134, GFR >60  04/16/2024 Creatinine 0.93, BUN 11, Potassium 3.2, Sodium 136, GFR >60  04/15/2024 Creatinine 1.07, BUN 13, Potassium 3.5, Sodium 136, GFR >60 04/14/2024 Creatinine 1.03, BUN 15, Potassium 3.7, Sodium 137, GFR >60  04/13/2024 Creatinine 1.05, BUN 17, Potassium 4.5, Sodium 133, GFR >60  04/12/2024 Creatinine 1.27, BUN 23, Potassium 4.1, Sodium 134, GFR >60  A complete set of results can be found in Results Review.   Recommendations:  Copy sent to Dr Rolan for review and recommendations if needed.     Follow-up plan: ICM clinic phone appointment on 05/25/2024 to recheck fluid levels.   91 day device clinic remote transmission 08/04/2024.   EP/Cardiology Office Visits:    Recall  12/07/2024 with Dr Fernande.   05/20/2024 with HF clinic.   Copy of ICM check sent to Dr. Fernande.   3 month ICM trend: 05/14/2024    12-14 Month ICM trend:     Mitzie GORMAN Garner, RN 05/14/2024 12:28 PM

## 2024-05-15 MED ORDER — POTASSIUM CHLORIDE CRYS ER 20 MEQ PO TBCR: 60.0000 meq | EXTENDED_RELEASE_TABLET | Freq: Every day | ORAL | 3 refills | Status: DC

## 2024-05-15 MED ORDER — TORSEMIDE 20 MG PO TABS: 80.0000 mg | ORAL_TABLET | Freq: Two times a day (BID) | ORAL | 2 refills | Status: AC

## 2024-05-15 NOTE — Progress Notes (Signed)
 Spoke with patient.  Advised Dr. Rolan recommended to increase Torsemide  20 mg to 4 tablets (80 mg total) twice a day.   Confirmed patient has been taking Potassium 20 mEq 1 tablet daily.   Advised Dr Rolan recommended to add additional 2 tablets to his current dosage which = 3 tablets (60 mEq total) once a day.   BMET Lab needed in 7 days which will be done at 8/6 HF clinic OV.   Prescription updated.  Pt has supply on hand and will call when refill is needed.    Pt verbalized understanding and agreed with plan.   Advised to call back for any questions.

## 2024-05-15 NOTE — Progress Notes (Signed)
 Increase torsemide  to 80 mg bid, BMET 1 week.  Add 40 mEq additional KCl daily to whatever regimen he is taking now.  Needs followup in CHF office.

## 2024-05-19 ENCOUNTER — Ambulatory Visit: Admitting: Family Medicine

## 2024-05-19 ENCOUNTER — Encounter

## 2024-05-19 ENCOUNTER — Telehealth (HOSPITAL_COMMUNITY): Payer: Self-pay

## 2024-05-19 ENCOUNTER — Other Ambulatory Visit: Payer: Self-pay

## 2024-05-19 ENCOUNTER — Encounter: Payer: Self-pay | Admitting: Family Medicine

## 2024-05-19 VITALS — BP 122/96 | HR 78 | Ht 70.0 in | Wt 240.0 lb

## 2024-05-19 DIAGNOSIS — M25512 Pain in left shoulder: Secondary | ICD-10-CM

## 2024-05-19 DIAGNOSIS — M19012 Primary osteoarthritis, left shoulder: Secondary | ICD-10-CM

## 2024-05-19 DIAGNOSIS — L2089 Other atopic dermatitis: Secondary | ICD-10-CM | POA: Diagnosis not present

## 2024-05-19 DIAGNOSIS — M25511 Pain in right shoulder: Secondary | ICD-10-CM

## 2024-05-19 DIAGNOSIS — M19011 Primary osteoarthritis, right shoulder: Secondary | ICD-10-CM | POA: Diagnosis not present

## 2024-05-19 DIAGNOSIS — R29898 Other symptoms and signs involving the musculoskeletal system: Secondary | ICD-10-CM

## 2024-05-19 DIAGNOSIS — M12811 Other specific arthropathies, not elsewhere classified, right shoulder: Secondary | ICD-10-CM

## 2024-05-19 DIAGNOSIS — I872 Venous insufficiency (chronic) (peripheral): Secondary | ICD-10-CM | POA: Diagnosis not present

## 2024-05-19 NOTE — Telephone Encounter (Signed)
 Called to confirm/remind patient of their appointment [facility] on 05/20/24.   Appointment:   [] Confirmed  [x] Left mess   [] No answer/No voice mail  [] VM Full/unable to leave message  [] Phone not in service  And to bring in all medications and/or complete list.

## 2024-05-19 NOTE — Patient Instructions (Signed)
See me again in 10weeks 

## 2024-05-19 NOTE — Assessment & Plan Note (Signed)
 Bilateral severe arthritic changes noted of the shoulders.  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  Discussed icing regimen and home exercises, which activities to do in which ones to avoid.  Can repeat injections every 10 to 12 weeks otherwise.

## 2024-05-19 NOTE — Assessment & Plan Note (Signed)
 Bilateral injections given, discussed with patient that icing regimen and home exercises, increase activity slowly.  Chronic problem again and will continue to do injections if are helpful.  Follow-up again 12 weeks.

## 2024-05-20 ENCOUNTER — Ambulatory Visit (HOSPITAL_COMMUNITY)
Admission: RE | Admit: 2024-05-20 | Discharge: 2024-05-20 | Disposition: A | Discharge status: Home / Self Care | Source: Ambulatory Visit | Attending: Family Medicine | Admitting: Family Medicine

## 2024-05-20 ENCOUNTER — Encounter (HOSPITAL_COMMUNITY): Payer: Self-pay

## 2024-05-20 VITALS — BP 104/60 | HR 71 | Wt 240.0 lb

## 2024-05-20 DIAGNOSIS — J449 Chronic obstructive pulmonary disease, unspecified: Secondary | ICD-10-CM | POA: Diagnosis not present

## 2024-05-20 DIAGNOSIS — I2510 Atherosclerotic heart disease of native coronary artery without angina pectoris: Secondary | ICD-10-CM | POA: Insufficient documentation

## 2024-05-20 DIAGNOSIS — I5020 Unspecified systolic (congestive) heart failure: Secondary | ICD-10-CM | POA: Diagnosis not present

## 2024-05-20 DIAGNOSIS — Z9981 Dependence on supplemental oxygen: Secondary | ICD-10-CM | POA: Diagnosis not present

## 2024-05-20 DIAGNOSIS — I4821 Permanent atrial fibrillation: Secondary | ICD-10-CM | POA: Diagnosis not present

## 2024-05-20 DIAGNOSIS — I442 Atrioventricular block, complete: Secondary | ICD-10-CM | POA: Insufficient documentation

## 2024-05-20 DIAGNOSIS — Z7901 Long term (current) use of anticoagulants: Secondary | ICD-10-CM | POA: Diagnosis not present

## 2024-05-20 DIAGNOSIS — I428 Other cardiomyopathies: Secondary | ICD-10-CM | POA: Diagnosis not present

## 2024-05-20 DIAGNOSIS — Z87891 Personal history of nicotine dependence: Secondary | ICD-10-CM | POA: Diagnosis not present

## 2024-05-20 DIAGNOSIS — G4733 Obstructive sleep apnea (adult) (pediatric): Secondary | ICD-10-CM | POA: Insufficient documentation

## 2024-05-20 DIAGNOSIS — Z95810 Presence of automatic (implantable) cardiac defibrillator: Secondary | ICD-10-CM | POA: Insufficient documentation

## 2024-05-20 DIAGNOSIS — I5022 Chronic systolic (congestive) heart failure: Secondary | ICD-10-CM | POA: Insufficient documentation

## 2024-05-20 DIAGNOSIS — Z79899 Other long term (current) drug therapy: Secondary | ICD-10-CM | POA: Insufficient documentation

## 2024-05-20 LAB — BASIC METABOLIC PANEL WITH GFR
Anion gap: 9 (ref 5–15)
BUN: 30 mg/dL — ABNORMAL HIGH (ref 8–23)
CO2: 27 mmol/L (ref 22–32)
Calcium: 9.7 mg/dL (ref 8.9–10.3)
Chloride: 99 mmol/L (ref 98–111)
Creatinine, Ser: 0.89 mg/dL (ref 0.61–1.24)
GFR, Estimated: >60 mL/min (ref >60)
Glucose, Bld: 80 mg/dL (ref 70–99)
Potassium: 5.7 mmol/L — ABNORMAL HIGH (ref 3.5–5.1)
Sodium: 135 mmol/L (ref 135–145)

## 2024-05-20 LAB — CBC
HCT: 35.1 % — ABNORMAL LOW (ref 39.0–52.0)
Hemoglobin: 11 g/dL — ABNORMAL LOW (ref 13.0–17.0)
MCH: 28.6 pg (ref 26.0–34.0)
MCHC: 31.3 g/dL (ref 30.0–36.0)
MCV: 91.4 fL (ref 80.0–100.0)
Platelets: 284 10*3/uL (ref 150–400)
RBC: 3.84 MIL/uL — ABNORMAL LOW (ref 4.22–5.81)
RDW: 16.2 % — ABNORMAL HIGH (ref 11.5–15.5)
WBC: 14.8 10*3/uL — ABNORMAL HIGH (ref 4.0–10.5)
nRBC: 0 % (ref 0.0–0.2)

## 2024-05-20 LAB — BRAIN NATRIURETIC PEPTIDE: B Natriuretic Peptide: 135.1 pg/mL — ABNORMAL HIGH (ref 0.0–100.0)

## 2024-05-20 NOTE — Progress Notes (Signed)
 ADVANCED HF CLINIC NOTE   ID:  FIELDS OROS, DOB 24-Aug-1956, MRN [id number]  Provider location: [facility] Type of Visit: Established patient  PCP:  Norleen Lynwood ORN, MD  Cardiologist:  Dr. Rolan   HPI: [name] is a 68 y.o. male who has history of COPD on home oxygen , permanent atrial fibrillation, and chronic systolic CHF.  Amiodarone  and DCCV were tried for atrial fibrillation in the past without success.   Patient has been noted to have a low EF since 2013.  EF improved to normal by 2015 but dropped to 30-35% by 11/16.  Cath in 6/17 showed some CAD but not enough to cause cardiomyopathy.  He was admitted in 3/18 with PNA, atrial fibrillation/RVR, and acute on chronic systolic CHF.  Echo in 3/18 showed fall in EF to 10-15%.   Underwent right and left heart cath in 4/18, which showed nonobstructive CAD and relatively optimized filling pressures.    Syncope in 11/18. Loop recorder showed 6 second pause and periods of complete heart block. He had Medtronic BiV ICD placed in 12/18.  In 1/19, he had AV nodal ablation to promote BiV pacing.   Echo was done 6/19 with EF 25-30% with moderate RV dilation/mildly decreased systolic function. CPX 7/19 was submaximal, probably mild functional impairment.    Echo in 1/21 showed EF 35-40% with diffuse hypokinesis, moderate RV dilation with mildly decreased systolic function.   Admitted with COVID-19 PNA in 2/19.  He had AKI/hypotension and Entresto  was stopped.   Echo in 3/22 showed EF 40% with diffuse hypokinesis, mild RV enlargement, mildly decreased RV systolic function.   Presented to [facility] 7/22 with weakness and dizziness. Found to be in hemorrhagic shock from acute GIB. Hgb 5.0, INR 8.4. He was on Coumadin  for atrial fibrillation. Resuscitated with PRBCs, IVF, and given Vitamin K . GDMT held d/t hypotension. Initially required ICU due to hypotension requiring pressors. GI consulted and planned to treat with oral  antibiotics for suspected diverticular bleed with plans for outpatient colonoscopy.  Coumadin  was transitioned to Eliquis .  Discharged back on beta blocker, Entresto  and SGLT2i. Dig, spiro and torsemide  were held at discharge. Weight 259 lbs.  S/p TKR 1/23  Follow up 12/23, NYHA II-early III, volume overloaded and torsemide  increased to 80 bid.   Echo 1/24 EF 40%, RV normal, no significant change from prior.  Admitted 2/24 w/ RLE cellulitis, failed outpatient abx. Received IV eventually transitioned to oral cefadroxil . Followed by ID, saw Dr. Luiz 12/18/22 and arranged for CT of RLE to rule out deep tissue abscess. Finished abx course.  Admitted 4/25 with hypovolemic shock 2/2 diarrhea. Required ICU care and vasopressors. Qualified for O2 at discharge, weight 274 lbs.  Admitted 6/25 with unintentional muscle relaxer OD, also with concerns for sepsis 2/2 diverticulitis. GDMT stopped with hypotension. Given IVF and abx, GI rec outpatient colonoscopy. He was discharged to SNF.  Seen in ED 05/05/24 with syncope. Felt to by 2/2 to vertigo. Discharged back to SNF.  Today he returns for HF follow up. Overall feeling fine. Can walk on flat ground and complete ADLs without undue dyspnea, wears 2L oxygen  at home. Lives with a friend. Has RLE swelling. Occasional palpitations and positional dizziness, no further falls. Denies abnormal bleeding, CP, or PND/Orthopnea. Chronically sleeps in recliner. Appetite ok. Weight at home 240 pounds. Taking all medications.  MDT device interrogation (personally reviewed): OptiVol chronically elevated, thoracic impedence just below reference line, 0.6 hr/day activity, 98.6% VP  ECG (personally reviewed):  V paced, 70 bpm  ReDs reading: 24 %, normal  Labs (3/25): LDL 49 Labs (7/25): K 3.3, creatinine 0.92, normal LFTs  PMH: 1. Asthma 2. COPD: On home oxygen . Prior smoker.  3. Atrial fibrillation: Permanent.  He failed DCCV and amiodarone  in the past . 4. HTN 5.  GERD 6. Hyperlipidemia: Myalgias with atorvastatin  7. BPH.  8. Chronic systolic CHF: Nonischemic cardiomyopathy.  - EF 25% by TEE in 7/13.  Normalized on 2015 echo. - Echo (11/16): EF 30-35%.  - LHC/RHC (6/17): 80% ostial stenosis small D1, 30-40% pLAD.  Mean RA 4, PA 26/10, mean PCWP 11, CI 2.33.  - Echo (3/18): EF 10-15%, diffuse hypokinesis, severe LV dilation, moderate RV dilation with severely decreased systolic function.  - LHC/RHC (4/18): 40% proximal LAD.  Mean RA 8, PA 37/10 mean 22, mean PCWP 22, LVEDP 9, CI 2.39. - Medtronic BiV ICD placement in 12/18 with AV nodal ablation to promote BiV pacing in 1/19.  - Echo (6/19): EF 25-30% with mild LV dilation, moderately dilated RV with mildly decreased systolic function, IVC not dilated, PASP 23 mmHg.  - CPX (7/19): peak VO2 17.6 (78% predicted), VE/VCO2 slope 31, RER 1.02 => submaximal, probably mild functional impairment.  - Echo (1/21): EF 35-40% with diffuse hypokinesis, moderate RV dilation with mildly decreased systolic function. - Echo (3/22): EF 40% with diffuse hypokinesis, mild RV enlargement, mildly decreased RV systolic function.  - Echo (1/24): EF 40%, RV normal - Echo (1/25): EF 40-45%, mild aortic stenosis 9. Morbid obesity 10. ILR in place.  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 Complete heart block: s/p Medtronic CRT-D.  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 OSA: Untreated, unable to tolerate CPAP.  15. COVID-19 PNA 2/21.  16. Hyperlipidemia: Myalgias with multiple statins.  17. PAD: ABIs 10/21 noncompressible on right but TBI normal, normal ABI and TBI on left.  18. GI bleeding: 7/22, diverticulosis.  19. Left TKR (1/23)  Social History   Socioeconomic History   Marital status: Divorced    Spouse name: Not on file   Number of children: 2   Years of education: Not on file   Highest education level: GED or equivalent  Occupational History   Occupation: retired/disabled. prev worked in Teacher, English as a foreign language.     Employer: DISABLED  Tobacco Use   Smoking status: Former    Current packs/day: 0.00    Average packs/day: 2.0 packs/day for 30.0 years (60.0 ttl pk-yrs)    Types: Cigarettes    Start date: 10/15/1977    Quit date: 10/16/2007    Years since quitting: 16.6   Smokeless tobacco: Never  Vaping Use   Vaping status: Never Used  Substance and Sexual Activity   Alcohol use: No   Drug use: No   Sexual activity: Not Currently  Other Topics Concern   Not on file  Social History Narrative   Lives with girlfriend   Social Drivers of Health   Financial Resource Strain: Medium Risk (01/01/2024)   Overall Financial Resource Strain (CARDIA)    Difficulty of Paying Living Expenses: Somewhat hard  Food Insecurity: No Food Insecurity (04/13/2024)   Hunger Vital Sign    Worried About Running Out of Food in the Last Year: Never true    Ran Out of Food in the Last Year: Never true  Transportation Needs: No Transportation Needs (04/13/2024)   PRAPARE - Administrator, Civil Service (Medical): No    Lack of Transportation (Non-Medical): No  Physical Activity: Inactive (  01/01/2024)   Exercise Vital Sign    Days of Exercise per Week: 0 days    Minutes of Exercise per Session: 0 min  Stress: No Stress Concern Present (01/01/2024)   [employer] of Occupational Health - Occupational Stress Questionnaire    Feeling of Stress : Only a little  Social Connections: Socially Isolated (04/13/2024)   Social Connection and Isolation Panel    Frequency of Communication with Friends and Family: Once a week    Frequency of Social Gatherings with Friends and Family: Never    Attends Religious Services: Never    Database administrator or Organizations: No    Attends Banker Meetings: Never    Marital Status: Divorced  Catering manager Violence: Not At Risk (04/13/2024)   Humiliation, Afraid, Rape, and Kick questionnaire    Fear of Current or Ex-Partner: No    Emotionally Abused: No     Physically Abused: No    Sexually Abused: No   Family History  Problem Relation Age of Onset   COPD Mother    Asthma Mother    Colon polyps Mother    Allergies Mother    Hypothyroidism Mother    Asthma Maternal Grandmother    Colon cancer Neg Hx    ROS: All systems reviewed and negative except as per HPI.   Current Outpatient Medications  Medication Sig Dispense Refill   acetaminophen  (TYLENOL ) 325 MG tablet Take 2 tablets (650 mg total) by mouth every 6 (six) hours as needed for fever or mild pain (pain score 1-3).     albuterol  (VENTOLIN  HFA) 108 (90 Base) MCG/ACT inhaler INHALE 1-2 PUFFS BY MOUTH EVERY 6 HOURS AS NEEDED FOR WHEEZE OR SHORTNESS OF BREATH 8.5 each 5   ammonium lactate  (AMLACTIN) 12 % cream Apply 1 Application topically as needed for dry skin. 385 g 3   apixaban  (ELIQUIS ) 5 MG TABS tablet Take 1 tablet (5 mg total) by mouth 2 (two) times daily. 60 tablet 11   ascorbic acid (VITAMIN C) 500 MG tablet Take 500 mg by mouth daily.     augmented betamethasone  dipropionate (DIPROLENE -AF) 0.05 % cream Apply 1 application  topically daily as needed (to affected areas- for itching).     bisacodyl  5 MG EC tablet Take 1 tablet (5 mg total) by mouth daily as needed for moderate constipation or mild constipation (if no bowel movement in two days on Linzess ). 30 tablet 0   Cholecalciferol  (VITAMIN D3) 50 MCG (2000 UT) TABS Take 2,000 Units by mouth daily.     Cyanocobalamin  (VITAMIN B-12) 2500 MCG SUBL Place 2,500 mcg under the tongue daily.     doxepin  (SINEQUAN ) 10 MG capsule Take 2 capsules (20 mg total) by mouth at bedtime. 180 capsule 1   doxycycline  (VIBRA -TABS) 100 MG tablet Take 1 tablet (100 mg total) by mouth 2 (two) times daily. 20 tablet 0   DULoxetine  (CYMBALTA ) 60 MG capsule TAKE 1 CAPSULE BY MOUTH EVERY DAY 90 capsule 2   Dupilumab (DUPIXENT) 300 MG/2ML SOPN Inject 300 mg into the skin every 14 (fourteen) days.     esomeprazole  (NEXIUM ) 40 MG capsule Take 1 capsule (40  mg total) by mouth daily at 12 noon. 30 capsule 2   Evolocumab  (REPATHA  SURECLICK) 140 MG/ML SOAJ INJECT 140 MG INTO THE SKIN EVERY 14 DAYS 6 mL 3   fluticasone  (FLONASE ) 50 MCG/ACT nasal spray Place 2 sprays into both nostrils daily as needed for allergies or rhinitis. 48 mL 1  gabapentin  (NEURONTIN ) 100 MG capsule Take 1 capsule (100 mg total) by mouth at bedtime.     ipratropium-albuterol  (DUONEB) 0.5-2.5 (3) MG/3ML SOLN TAKE 3 ML BY NEBULIZATION EVERY 4 HOURS AS NEEDED (WHEEZING). 360 mL 0   lidocaine  (LIDODERM ) 5 % Place 1 patch onto the skin daily. Remove & Discard patch within 12 hours or as directed by MD 30 patch 2   linaclotide  (LINZESS ) 145 MCG CAPS capsule Take 1 capsule (145 mcg total) by mouth daily before breakfast. 30 capsule 2   magnesium  hydroxide (MILK OF MAGNESIA) 400 MG/5ML suspension Take 5 mLs by mouth daily as needed for mild constipation. 355 mL 0   meclizine  (ANTIVERT ) 25 MG tablet Take 1 tablet (25 mg total) by mouth 3 (three) times daily as needed for dizziness. 30 tablet 0   Multiple Vitamins-Minerals (SENIOR VITES PO) Take 1 tablet by mouth daily with breakfast.     omeprazole  (PRILOSEC) 20 MG capsule TAKE 1 CAPSULE BY MOUTH TWICE A DAY 180 capsule 1   ondansetron  (ZOFRAN -ODT) 4 MG disintegrating tablet Take 1 tablet (4 mg total) by mouth every 8 (eight) hours as needed. 30 tablet 1   oxyCODONE  (ROXICODONE ) 5 MG immediate release tablet Take 1 tablet (5 mg total) by mouth every 6 (six) hours as needed for severe pain (pain score 7-10). 30 tablet 0   potassium chloride  SA (KLOR-CON  M) 20 MEQ tablet Take 3 tablets (60 mEq total) by mouth daily. 270 tablet 3   predniSONE  (DELTASONE ) 10 MG tablet Take 10 mg by mouth every morning.     tamsulosin  (FLOMAX ) 0.4 MG CAPS capsule Take 1 capsule (0.4 mg total) by mouth daily. 30 capsule 0   Tiotropium Bromide  Monohydrate (SPIRIVA  RESPIMAT) 2.5 MCG/ACT AERS Take 2 puffs by nebulization daily. 4 g 3   tirzepatide  (MOUNJARO ) 7.5  MG/0.5ML Pen Inject 7.5 mg into the skin once a week. 6 mL 3   tiZANidine  (ZANAFLEX ) 4 MG tablet Take 1 tablet (4 mg total) by mouth at bedtime as needed for muscle spasms. 10 tablet 0   torsemide  (DEMADEX ) 20 MG tablet Take 4 tablets (80 mg total) by mouth 2 (two) times daily. 720 tablet 2   traMADol  (ULTRAM ) 50 MG tablet Take 1 tablet (50 mg total) by mouth every 8 (eight) hours as needed for moderate pain (pain score 4-6). 15 tablet 0   traZODone  (DESYREL ) 100 MG tablet TAKE 2 TABLETS BY MOUTH AT BEDTIME AS NEEDED 180 tablet 1   TUMS 500 MG chewable tablet Chew 1 tablet by mouth 2 (two) times daily as needed for indigestion or heartburn.     venlafaxine  XR (EFFEXOR -XR) 150 MG 24 hr capsule TAKE 1 CAPSULE BY MOUTH DAILY WITH BREAKFAST. 90 capsule 3   WIXELA INHUB 250-50 MCG/ACT AEPB INHALE 1 PUFF INTO THE LUNGS IN THE MORNING AND AT BEDTIME. 60 each 8   No current facility-administered medications for this encounter.   Wt Readings from Last 3 Encounters:  05/20/24 108.9 kg (240 lb)  05/19/24 108.9 kg (240 lb)  04/23/24 111.1 kg (245 lb)   BP 104/60   Pulse 71   Wt 108.9 kg (240 lb)   SpO2 96% Comment: 2 l n/c  BMI 34.44 kg/m   PHYSICAL EXAM: General:  NAD. No resp difficulty, arrived in [facility] on oxygen , chronically-ill appearing, kyphosis HEENT: Normal Neck: Supple. No JVD. Thick neck Cor: Regular rate & rhythm. No rubs, gallops or murmurs. Lungs: Clear Abdomen: Obese, nontender, nondistended.  Extremities: No cyanosis, clubbing,  rash, trace BLE edema R>L Neuro: Alert & oriented x 3, moves all 4 extremities w/o difficulty. Affect pleasant.  Assessment/Plan:  1. Atrial fibrillation: Permanent.  Now s/p AV nodal ablation to allow effective BiV pacing.  - Continue Eliquis  5 mg bid. No bleeding issues. CBC today. 2. COPD: No longer smoking.  I think that COPD plays a major role in his dyspnea.  - Continue oxygen  - Followed by Dr. Kassie  3. Chronic systolic CHF: Nonischemic  cardiomyopathy based on 6/17 cath. However, he had a recent significant fall in EF from 30-35% to 10-15% on echo from 3/18.  LHC/RHC in 4/18 showed nonobstructive CAD and relatively optimized filling pressures.  He now has Medtronic CRT-D device with AV nodal ablation to allow BiV pacing.  Echo 6/19 with EF 25-30% with mildly decreased RV systolic function. CPX 7/19 was submaximal but likely only mild functional limitation from CHF.  Echo in 1/21 showed EF 35-40%, echo in 3/22 and again in 1/24 showed EF up to 40% with mildly decreased RV systolic function.  Echo 1/25 showed EF 40-45%, mild aortic stenosis. NYHA III chronically, confounded by body habitus, COPD, and physical deconditioning. He is not volume overloaded by exam or ReDs 24%, weight is down. OptiVol chronically elevated x months, suspect with 30+ lb weight loss, this needs to be reset. GDMT limited by low BP. - Off losartan , eplerenone  and Coreg  with low BP - Continue torsemide  80 mg bid. BMET and BNP today. - Followed closely by device RN in iCM  4. CAD: Nonobstructive on 4/18 cath.  No exertional chest pain, rare atypical chest pain.  - No ASA with Eliquis  use. - Statin myalgias, continue Repatha . Last LDL 49 5. Complete heart block: Now has Medtronic CRT-D device and s/p AV nodal ablation.  - Device interrogation as above. 6. OSA: Unable to tolerate CPAP. Wears oxygen  at night. - No change.   Follow up in 2-3 months with Dr. Rolan.   Signed, Harlene CHRISTELLA Gainer, FNP  05/20/2024  [facility] [address] [facility] [place] [address] [phone] (office) [phone] (fax)

## 2024-05-20 NOTE — Progress Notes (Signed)
 ReDS Vest / Clip - 05/20/24 1500       ReDS Vest / Clip   Station Marker D    Ruler Value 39    ReDS Value Range Low volume    ReDS Actual Value 24

## 2024-05-20 NOTE — Patient Instructions (Signed)
 Medication Changes:  No Changes In Medications at this time.   Lab Work:  Labs done today, your results will be available in MyChart, we will contact you for abnormal readings.   Follow-Up in: 3 MONTHS WITH DR. ROLAN PLEASE CALL OUR OFFICE AROUND SEPTEMBER TO GET SCHEDULED FOR YOUR APPOINTMENT. PHONE NUMBER IS [phone] OPTION 2   At the [facility], you and your health needs are our priority. We have a designated team specialized in the treatment of Heart Failure. This Care Team includes your primary Heart Failure Specialized Cardiologist (physician), Advanced Practice Providers (APPs- Physician Assistants and Nurse Practitioners), and Pharmacist who all work together to provide you with the care you need, when you need it.   You may see any of the following providers on your designated Care Team at your next follow up:  Dr. Toribio Fuel Dr. Ezra ROLAN Dr. Ria Commander Dr. Odis Brownie Greig Mosses, NP Caffie Shed, [place] [facility] [place], [place] Beckey Coe, NP [place] Lee, NP Tinnie Redman, PharmD   Please be sure to bring in all your medications bottles to every appointment.   Need to Contact Us :  If you have any questions or concerns before your next appointment please send us  a message through [place] or call our office at [phone].    TO LEAVE A MESSAGE FOR THE NURSE SELECT OPTION 2, PLEASE LEAVE A MESSAGE INCLUDING: YOUR NAME DATE OF BIRTH CALL BACK NUMBER REASON FOR CALL**this is important as we prioritize the call backs  YOU WILL RECEIVE A CALL BACK THE SAME DAY AS LONG AS YOU CALL BEFORE 4:00 PM

## 2024-05-21 ENCOUNTER — Telehealth (HOSPITAL_COMMUNITY): Payer: Self-pay | Admitting: Cardiology

## 2024-05-21 NOTE — Telephone Encounter (Signed)
 Patient called for lab results Reports he saw the abnormalities on mychart and wanted to be sure changes are not needed

## 2024-05-22 ENCOUNTER — Ambulatory Visit (HOSPITAL_COMMUNITY): Payer: Self-pay | Admitting: Family Medicine

## 2024-05-22 DIAGNOSIS — I5020 Unspecified systolic (congestive) heart failure: Secondary | ICD-10-CM

## 2024-05-22 NOTE — Addendum Note (Signed)
 Encounter addended by: Buell Powell HERO, RN on: 05/22/2024 2:45 PM  Actions taken: Charge Capture section accepted

## 2024-05-22 NOTE — Telephone Encounter (Signed)
 Patient reports he was taking torsemide  80 BID with KCL 40/20  States he was given instructions to hold potassium and repeat labs early next week, would like to follow these instructions as they come with less changes.  -educated on how two providers addressed same result (result note/ordering provider vs triage call/triage provider). Appreciative of returned call

## 2024-05-23 DIAGNOSIS — J4489 Other specified chronic obstructive pulmonary disease: Secondary | ICD-10-CM | POA: Diagnosis not present

## 2024-05-23 DIAGNOSIS — I480 Paroxysmal atrial fibrillation: Secondary | ICD-10-CM | POA: Diagnosis not present

## 2024-05-23 DIAGNOSIS — I509 Heart failure, unspecified: Secondary | ICD-10-CM | POA: Diagnosis not present

## 2024-05-23 DIAGNOSIS — F419 Anxiety disorder, unspecified: Secondary | ICD-10-CM | POA: Diagnosis not present

## 2024-05-23 DIAGNOSIS — D649 Anemia, unspecified: Secondary | ICD-10-CM | POA: Diagnosis not present

## 2024-05-23 DIAGNOSIS — E1142 Type 2 diabetes mellitus with diabetic polyneuropathy: Secondary | ICD-10-CM | POA: Diagnosis not present

## 2024-05-23 DIAGNOSIS — I110 Hypertensive heart disease with heart failure: Secondary | ICD-10-CM | POA: Diagnosis not present

## 2024-05-23 DIAGNOSIS — M19011 Primary osteoarthritis, right shoulder: Secondary | ICD-10-CM | POA: Diagnosis not present

## 2024-05-23 DIAGNOSIS — M19012 Primary osteoarthritis, left shoulder: Secondary | ICD-10-CM | POA: Diagnosis not present

## 2024-05-25 ENCOUNTER — Telehealth: Payer: Self-pay

## 2024-05-25 ENCOUNTER — Ambulatory Visit (INDEPENDENT_AMBULATORY_CARE_PROVIDER_SITE_OTHER): Admitting: Podiatry

## 2024-05-25 ENCOUNTER — Ambulatory Visit (INDEPENDENT_AMBULATORY_CARE_PROVIDER_SITE_OTHER)

## 2024-05-25 VITALS — Ht 70.0 in | Wt 240.0 lb

## 2024-05-25 DIAGNOSIS — L97512 Non-pressure chronic ulcer of other part of right foot with fat layer exposed: Secondary | ICD-10-CM

## 2024-05-25 DIAGNOSIS — Z95810 Presence of automatic (implantable) cardiac defibrillator: Secondary | ICD-10-CM

## 2024-05-25 DIAGNOSIS — E08621 Diabetes mellitus due to underlying condition with foot ulcer: Secondary | ICD-10-CM

## 2024-05-25 DIAGNOSIS — I5022 Chronic systolic (congestive) heart failure: Secondary | ICD-10-CM

## 2024-05-25 NOTE — Telephone Encounter (Signed)
 Call to patient and advised automatic transmssion was not receive.  Requested manual remote transmission for fluid level review and he agreed to send one today.

## 2024-05-26 ENCOUNTER — Other Ambulatory Visit (HOSPITAL_COMMUNITY)

## 2024-05-26 ENCOUNTER — Telehealth: Payer: Self-pay | Admitting: Family Medicine

## 2024-05-26 NOTE — Progress Notes (Signed)
 EPIC Encounter for ICM Monitoring  Patient Name: [name] is a 68 y.o. male Date: 05/26/2024 Primary Care Physican: Norleen Lynwood ORN, MD Primary Cardiologist: Rolan Electrophysiologist: Fernande Pore Pacing: 98.1%    11/04/2023 Office Weight: 285 lbs 05/26/2024 Home Weight: 240 lbs (approximately 30 lb loss over the last few months)   Battery Longevity: 5 months     Spoke with patient and heart failure questions reviewed.  Transmission results reviewed.  Pt asymptomatic for fluid accumulation.  He is feeling good at this time.  Will need to have a toe amputated soon and waiting or procedure date.   Pt sometimes has transportation problems but confirms he will have labs drawn on 8/14.   He confirmed he has stopped Potassium as instructed by HF clinic.      Pt reports following fluid symptoms:  Weight gain: None SOB:  Always SOB d/t COPD and wears O2 24/7 Difficulty sleeping at night:  No Swelling: Will have 2nd toe amputated and waiting on procedure to be scheduled Diet/fluid intake changes:  Tries to limit salt intake.  Limits fluid intake to 64 oz daily Optivol - scheduled to 8/14 to have baseline reset to display more accurate fluid levels.   Optivol thoracic impedance suggesting fluid level improvement since Torsemide  increased to 80 mg bid on 8/1.  Fluid index >normal threshold since 4/25.  Per HF clinic note 8/6, suspect Optivol needs to be reset due to 30+ lb weight loss. ReDs 24% which is normal range.   Prescribed:  Torsemide  20 mg 4 tablet(s) (80 mg total) by mouth twice a day.  (Increased 8/1)   Labs: 05/28/2024 BMET scheduled at HF clinic - Confirmed patient will be at appointment 05/04/2024 Creatinine 0.92, BUN 12, Potassium 3.3, Sodium 138, GFR >60  04/17/2024 Creatinine 1.24, BUN 16, Potassium 3.2, Sodium 134, GFR >60  04/16/2024 Creatinine 0.93, BUN 11, Potassium 3.2, Sodium 136, GFR >60  04/15/2024 Creatinine 1.07, BUN 13, Potassium 3.5, Sodium 136, GFR >60 04/14/2024  Creatinine 1.03, BUN 15, Potassium 3.7, Sodium 137, GFR >60  04/13/2024 Creatinine 1.05, BUN 17, Potassium 4.5, Sodium 133, GFR >60  04/12/2024 Creatinine 1.27, BUN 23, Potassium 4.1, Sodium 134, GFR >60  A complete set of results can be found in Results Review.   Recommendations:  Explained the fluid level reading may be inaccurate due to 30 lb weight loss and device fluid level baseline needs to be reset.  Explained Medtronic rep will to meet him at the 8/14 lab appt in HF Clinic to make the needed adjustment.  Explained Powell Latino, RN at HF clinic will be the contact person.     Follow-up plan: ICM clinic phone appointment on 06/16/2024.   91 day device clinic remote transmission 08/04/2024.   EP/Cardiology Office Visits:    Recall 12/07/2024 with Dr Fernande.   Recall 08/18/2024 with Dr Rolan.    Copy of ICM check sent to Dr. Fernande.   3 month ICM trend: 05/25/2024.    12-14 Month ICM trend:     Mitzie GORMAN Garner, RN 05/26/2024 8:20 AM

## 2024-05-26 NOTE — Progress Notes (Signed)
  Received: Today [place], Harlene HERO, FNP  Demontrez Rindfleisch, Mitzie RAMAN, RN Thanks Mitzie! Will wait to his follow up and medtronic reset

## 2024-05-26 NOTE — Progress Notes (Signed)
 Chief Complaint  Patient presents with   Wound Check    Pt is here due to area on right 2nd toe, he states the spot has been there for months, states he has seen Dr Lamount for this issue before, area is red and he states that it hurts.    Subjective:  68 y.o. male with PMHx of diabetes mellitus presenting today for evaluation of chronic ulcer to the right second toe ongoing for over 1 year now.  He has intermittently been on multiple rounds of antibiotics and wound care with no improvement of the ulcer.  It is very painful on a daily basis.  He is very frustrated with the wound and he presents for further treatment and evaluation   Past Medical History:  Diagnosis Date   AICD (automatic cardioverter/defibrillator) present    Anemia    supposed to be taking Vit B but doesn't   ANXIETY    takes Xanax  nightly   Arthritis    Asthma    Albuterol  prn and Advair  daily;also takes Prednisone  daily   Cardiomyopathy [facility]    a. EF 25% TEE July 2013; b. EF normalized 2015;  c. 03/2015 Echo: EF 40-45%, difrf HK, PASP 38 mmHg, Mild MR, sev LAE/RAE.   Chronic constipation    takes OTC stool softener   COPD (chronic obstructive pulmonary disease) (HCC)    one dr says COPD; one dr says emphysema (09/18/2017)   COVID-19 12/09/2019   DEPRESSION    takes Zoloft  and Doxepin  daily   Diverticulitis    DYSKINESIA, ESOPHAGUS    Essential hypertension        FIBROMYALGIA    GERD (gastroesophageal reflux disease)        Glaucoma    HYPERLIPIDEMIA    a. Intolerant to statins.   INSOMNIA    takes Ambien  nightly   Myocardial infarction [facility])    a. 2012 Myoview  notable for prior infarct;  b. 03/2015 Lexiscan  CL: EF 37%, diff HK, small area of inferior infarct from apex to base-->Med Rx.   O2 dependent    2.5L q hs & prn (09/18/2017)   Paroxysmal atrial fibrillation (HCC)    a. CHA2DS2VASc = 3--> takes Coumadin ;  b. 03/15/2015 Successful TEE/DCCV;  c. 03/2015 recurrent afib, Amio d/c'd in setting of  hyperthyroidism.   Peripheral neuropathy    Pneumonia 12/2016   Rash and other nonspecific skin eruption 04/12/2009   no cause found saw dermatologists x 2 and allergist   SLEEP APNEA, OBSTRUCTIVE    a. doesn't use CPAP   Syncope    a. 03/2015 s/p MDT LINQ.   Type II diabetes mellitus (HCC)         Past Surgical History:  Procedure Laterality Date   ACNE CYST REMOVAL     2 on back    AV NODE ABLATION N/A 10/25/2017   Procedure: AV NODE ABLATION;  Surgeon: Fernande Elspeth BROCKS, MD;  Location: [facility] INVASIVE CV LAB;  Service: Cardiovascular;  Laterality: N/A;   BIV ICD INSERTION CRT-D N/A 09/18/2017   Procedure: BIV ICD INSERTION CRT-D;  Surgeon: Fernande Elspeth BROCKS, MD;  Location: [facility] INVASIVE CV LAB;  Service: Cardiovascular;  Laterality: N/A;   CARDIAC CATHETERIZATION N/A 03/21/2016   Procedure: Right/Left Heart Cath and Coronary Angiography;  Surgeon: Ezra GORMAN Shuck, MD;  Location: [facility] INVASIVE CV LAB;  Service: Cardiovascular;  Laterality: N/A;   CARDIOVERSION  04/18/2012   Procedure: CARDIOVERSION;  Surgeon: Vina LULLA Gull, MD;  Location: [facility] OR;  Service: Cardiovascular;  Laterality: N/A;   CARDIOVERSION  04/25/2012   Procedure: CARDIOVERSION;  Surgeon: Aleene JINNY Passe, MD;  Location: [facility] ENDOSCOPY;  Service: Cardiovascular;  Laterality: N/A;   CARDIOVERSION  04/25/2012   Procedure: CARDIOVERSION;  Surgeon: Vina LULLA Gull, MD;  Location: [facility] OR;  Service: Cardiovascular;  Laterality: N/A;   CARDIOVERSION  05/09/2012   Procedure: CARDIOVERSION;  Surgeon: Ozell Fell, MD;  Location: [facility] OR;  Service: Cardiovascular;  Laterality: N/A;  changed from crenshaw to cooper by trish/leone-endo   CARDIOVERSION N/A 03/15/2015   Procedure: CARDIOVERSION;  Surgeon: Aleene JINNY Passe, MD;  Location: [facility] ENDOSCOPY;  Service: Cardiovascular;  Laterality: N/A;   COLONOSCOPY     COLONOSCOPY WITH PROPOFOL  N/A 10/21/2014   Procedure: COLONOSCOPY WITH PROPOFOL ;  Surgeon: Gwendlyn ONEIDA Buddy, MD;  Location: [facility] ENDOSCOPY;  Service:  Endoscopy;  Laterality: N/A;   EP IMPLANTABLE DEVICE N/A 04/06/2015   Procedure: Loop Recorder Insertion;  Surgeon: Danelle LELON Birmingham, MD;  Location: [facility] INVASIVE CV LAB;  Service: Cardiovascular;  Laterality: N/A;   ESOPHAGOGASTRODUODENOSCOPY     JOINT REPLACEMENT     LOOP RECORDER REMOVAL N/A 09/18/2017   Procedure: LOOP RECORDER REMOVAL;  Surgeon: Fernande Elspeth BROCKS, MD;  Location: [facility] INVASIVE CV LAB;  Service: Cardiovascular;  Laterality: N/A;   RIGHT/LEFT HEART CATH AND CORONARY ANGIOGRAPHY N/A 01/28/2017   Procedure: Right/Left Heart Cath and Coronary Angiography;  Surgeon: Ezra GORMAN Shuck, MD;  Location: [facility] INVASIVE CV LAB;  Service: Cardiovascular;  Laterality: N/A;   TEE WITHOUT CARDIOVERSION  04/25/2012   Procedure: TRANSESOPHAGEAL ECHOCARDIOGRAM (TEE);  Surgeon: Aleene JINNY Passe, MD;  Location: [facility] ENDOSCOPY;  Service: Cardiovascular;  Laterality: N/A;   TEE WITHOUT CARDIOVERSION N/A 03/15/2015   Procedure: TRANSESOPHAGEAL ECHOCARDIOGRAM (TEE);  Surgeon: Aleene JINNY Passe, MD;  Location: [facility] ENDOSCOPY;  Service: Cardiovascular;  Laterality: N/A;   TONSILLECTOMY AND ADENOIDECTOMY     TOTAL KNEE ARTHROPLASTY Right 06/15/2014   Procedure: TOTAL KNEE ARTHROPLASTY;  Surgeon: Evalene JONETTA Chancy, MD;  Location: [facility] OR;  Service: Orthopedics;  Laterality: Right;   TOTAL KNEE ARTHROPLASTY Left 10/17/2021   Procedure: Left TOTAL KNEE ARTHROPLASTY;  Surgeon: Vernetta Lonni GRADE, MD;  Location: [facility] OR;  Service: Orthopedics;  Laterality: Left;    Allergies  Allergen Reactions   Amiodarone  Other (See Comments)    Caused hyperthyroidism    Statins Other (See Comments)    Myalgias    Sulfa  Antibiotics Other (See Comments)    Mouth ulcers   Wound Dressing Adhesive Other (See Comments)    Skin Tears Use Paper Tape Only    RT second toe 05/25/2024  Objective/Physical Exam General: The patient is alert and oriented x3 in no acute distress.  Dermatology:  Wound #1 noted to the PIPJ of the right second toe  measuring approximately 0.6 x 0.6 x 0.2 cm (LxWxD).   The wound has been present for over 1 year and likely probes to bone and joint capsule.  Moderate erythema noted around the wound  Vascular: VAS US  ABI WITH/WO TBI 10/22/2022 ABI Findings:  +---------+------------------+-----+---------+--------+  Right   Rt Pressure (mmHg)IndexWaveform Comment   +---------+------------------+-----+---------+--------+  Brachial 134                                       +---------+------------------+-----+---------+--------+  PTA     254               1.87 triphasic          +---------+------------------+-----+---------+--------+  PERO    254               1.87 biphasic           +---------+------------------+-----+---------+--------+  DP      254               1.87 triphasic          +---------+------------------+-----+---------+--------+  Great Toe125               0.92 Normal             +---------+------------------+-----+---------+--------+   +---------+------------------+-----+---------+-------+  Left    Lt Pressure (mmHg)IndexWaveform Comment  +---------+------------------+-----+---------+-------+  Brachial 136                                      +---------+------------------+-----+---------+-------+  PTA     174               1.28 triphasic         +---------+------------------+-----+---------+-------+  PERO    254               1.90 triphasic         +---------+------------------+-----+---------+-------+  DP      164               1.21 triphasic         +---------+------------------+-----+---------+-------+  [place] Grizzle               0.83 Normal            +---------+------------------+-----+---------+-------+   +-------+-----------+-----------+------------+------------+  ABI/TBIToday's ABIToday's TBIPrevious ABIPrevious TBI  +-------+-----------+-----------+------------+------------+  Right [place]         0.92        1.33        0.77          +-------+-----------+-----------+------------+------------+  Left  [place]         0.83       1.16        0.82          +-------+-----------+-----------+------------+------------+  Arterial wall calcification precludes accurate ankle pressures and ABIs. Tibial waveforms demonstrate brisk, multiphasic flow consistent with no evidence of significant arterial occlusive disease. Bilateral ABIs and TBIs appear essentially unchanged compared to prior study on 07/27/20.    Summary:  Right: Resting right ankle-brachial index indicates  noncompressible right lower extremity arteries. The right toe-brachial index is normal.  Left: Resting left ankle-brachial index indicates noncompressible left lower extremity arteries. The left toe-brachial index is normal.   Neurological: Light touch and protective threshold diminished bilaterally.   Musculoskeletal Exam: Patient ambulatory.  No prior amputations  Assessment: 1.  Chronic ulcer PIPJ right second toe with likely underlying osteomyelitis secondary to diabetes mellitus 2. diabetes mellitus w/ peripheral neuropathy   Plan of Care:  -Patient evaluated -Unfortunate the patient has had the wound for over 1 year and has failed conservative treatment and management including multiple rounds of antibiotics and wound care.  I do believe that amputation of the toe is a viable option to alleviate the patient's pain and resolve the chronic infections and nonhealing wound to the toe.  This was discussed with the patient and he agrees and would like to proceed with this plan.  Risk benefits advantages and disadvantages the procedure were explained in length in detail to the patient.  No guarantees were expressed or implied. -Authorization for surgery was initiated  today.  Surgery will consist of right second toe amputation performed outpatient at [facility] -Return to clinic 1 week postop   Thresa EMERSON Sar, DPM [facility]  Dr. Thresa EMERSON Sar, DPM    [address] [place], [place] [address]                Office [phone]  Fax [phone]

## 2024-05-26 NOTE — Telephone Encounter (Signed)
 Patient was scheduled to come today for labs, but does not have transport. Patient scheduled to come Thursday this week- will forward to provider to make aware.

## 2024-05-26 NOTE — Telephone Encounter (Signed)
 Pt saw Dr. Artist at [facility] yesterday, he is recommending a procedure to amputate the toe he is having an issue with, pt would like Dr. Theressa opinion on moving forward with this procedure.  Pt aware Dr. Claudene is not in the office.

## 2024-05-27 ENCOUNTER — Encounter: Payer: Self-pay | Admitting: Podiatry

## 2024-05-27 ENCOUNTER — Telehealth: Payer: Self-pay | Admitting: Podiatry

## 2024-05-27 ENCOUNTER — Other Ambulatory Visit: Payer: Self-pay | Admitting: Podiatry

## 2024-05-27 ENCOUNTER — Telehealth: Payer: Self-pay

## 2024-05-27 MED ORDER — OXYCODONE-ACETAMINOPHEN 5-325 MG PO TABS: 1.0000 | ORAL_TABLET | ORAL | 0 refills | Status: DC | PRN

## 2024-05-27 NOTE — Telephone Encounter (Signed)
   Pre-operative Risk Assessment    Patient Name: [name]  DOB: [birth year] MRN: [id number]   Date of last office visit: 12/13/23 STEVEN KLEIN, MD Date of next office visit: NONE   Request for Surgical Clearance    Procedure:  FOOT SURGERY 2( RT 2ND TOE AMPUTATION  Date of Surgery:  Clearance 06/01/24                                Surgeon:  NOT INDICATED Surgeon's Group or Practice Name:  [facility] Phone number:  [phone] Fax number:  [phone]   Type of Clearance Requested:   - Medical  - Pharmacy:  Hold Apixaban  (Eliquis )     Type of Anesthesia:  Not Indicated   Additional requests/questions:    Signed, Lucie DELENA Ku   05/27/2024, 10:24 AM

## 2024-05-27 NOTE — Telephone Encounter (Signed)
 Pharmacy please advise on holding Eliquis  prior to  FOOT SURGERY 2( RT 2ND TOE AMPUTATION  scheduled for TBD. Last labs 05/16/2024. Thank you.

## 2024-05-27 NOTE — Telephone Encounter (Signed)
 DOS- 06/01/2024  RT 2ND AMPUTATION TOE MPJ JOINT- 71179  [employer] EFFECTIVE DATE- 04/14/2024  DEDUCTIBLE- N/A OOP- $3249 REMAINING- $0 COINSURANCE- 0%/$450 COPAY  PER [facility] WEBSITE, NO PRIOR AUTH IS REQUIRED FOR CPT CODE 28820.  DOCUMENTATION IS ATTACHED TO SURGERY CONSENT PACKET.

## 2024-05-27 NOTE — Telephone Encounter (Signed)
 Left message for pt to call to discuss possible surgery on 8/18. Pt is on monjouro and would need to be off for a week prior to surgery.

## 2024-05-27 NOTE — Telephone Encounter (Signed)
 Spoke to Dr Janit and he sent medication in.   Notified pt and he said thank you

## 2024-05-27 NOTE — Telephone Encounter (Signed)
 Pt called and is in a lot of pain and cannot get comfortable, feels like needles poking in it. Can you call in something for pain to get him thru until Monday's surgery please.

## 2024-05-27 NOTE — Progress Notes (Signed)
 PRN foot pain

## 2024-05-27 NOTE — Telephone Encounter (Signed)
 Difficult decision If truly went to wound care already then that is off the table. If not would reccomend referral to them.  Otherwise maybe get ct scan of foot to further evaluate and if shows severity of osteomyelitis then probably proceed with amputation

## 2024-05-27 NOTE — Telephone Encounter (Signed)
 Left message for patient to call back for recommendations.

## 2024-05-27 NOTE — H&P (View-Only) (Signed)
 PRN foot pain

## 2024-05-28 ENCOUNTER — Telehealth: Payer: Self-pay | Admitting: Gastroenterology

## 2024-05-28 ENCOUNTER — Ambulatory Visit (HOSPITAL_COMMUNITY)
Admission: RE | Admit: 2024-05-28 | Discharge: 2024-05-28 | Disposition: A | Discharge status: Home / Self Care | Source: Ambulatory Visit | Attending: Cardiology | Admitting: Cardiology

## 2024-05-28 DIAGNOSIS — M19011 Primary osteoarthritis, right shoulder: Secondary | ICD-10-CM | POA: Diagnosis not present

## 2024-05-28 DIAGNOSIS — J4489 Other specified chronic obstructive pulmonary disease: Secondary | ICD-10-CM | POA: Diagnosis not present

## 2024-05-28 DIAGNOSIS — I110 Hypertensive heart disease with heart failure: Secondary | ICD-10-CM | POA: Diagnosis not present

## 2024-05-28 DIAGNOSIS — I509 Heart failure, unspecified: Secondary | ICD-10-CM | POA: Diagnosis not present

## 2024-05-28 DIAGNOSIS — F419 Anxiety disorder, unspecified: Secondary | ICD-10-CM | POA: Diagnosis not present

## 2024-05-28 DIAGNOSIS — M19012 Primary osteoarthritis, left shoulder: Secondary | ICD-10-CM | POA: Diagnosis not present

## 2024-05-28 DIAGNOSIS — I5020 Unspecified systolic (congestive) heart failure: Secondary | ICD-10-CM | POA: Diagnosis not present

## 2024-05-28 DIAGNOSIS — E1142 Type 2 diabetes mellitus with diabetic polyneuropathy: Secondary | ICD-10-CM | POA: Diagnosis not present

## 2024-05-28 DIAGNOSIS — D649 Anemia, unspecified: Secondary | ICD-10-CM | POA: Diagnosis not present

## 2024-05-28 DIAGNOSIS — I480 Paroxysmal atrial fibrillation: Secondary | ICD-10-CM | POA: Diagnosis not present

## 2024-05-28 LAB — BASIC METABOLIC PANEL WITH GFR
Anion gap: 8 (ref 5–15)
BUN: 26 mg/dL — ABNORMAL HIGH (ref 8–23)
CO2: 30 mmol/L (ref 22–32)
Calcium: 9.3 mg/dL (ref 8.9–10.3)
Chloride: 101 mmol/L (ref 98–111)
Creatinine, Ser: 1.07 mg/dL (ref 0.61–1.24)
GFR, Estimated: >60 mL/min (ref >60)
Glucose, Bld: 87 mg/dL (ref 70–99)
Potassium: 4.3 mmol/L (ref 3.5–5.1)
Sodium: 139 mmol/L (ref 135–145)

## 2024-05-28 NOTE — Telephone Encounter (Signed)
 Inbound call from patient stating he was last seen on 7/10 and recently has been experiencing some lower stomach pain and for the last two nights its been waking him up  Requesting a call back from nurse   Please advise  Thank you

## 2024-05-28 NOTE — Telephone Encounter (Signed)
 Patient with diagnosis of atrial fibrillation on Eliquis  for anticoagulation.    Procedure:  FOOT SURGERY 2( RT 2ND TOE AMPUTATION   Date of Surgery:  Clearance 06/01/24    CHA2DS2-VASc Score = 5   This indicates a 7.2% annual risk of stroke. The patient's score is based upon: CHF History: 1 HTN History: 1 Diabetes History: 1 Stroke History: 0 Vascular Disease History: 1 Age Score: 1 Gender Score: 0   CrCl 121 Platelet count 284  Patient has not had an Afib/aflutter ablation within the last 3 months or DCCV within the last 30 days  Per office protocol, patient can hold Eliquis  for 2 days prior to procedure.   Patient will not need bridging with Lovenox  (enoxaparin ) around procedure.  **This guidance is not considered finalized until pre-operative APP has relayed final recommendations.**

## 2024-05-29 ENCOUNTER — Other Ambulatory Visit: Payer: Self-pay

## 2024-05-29 ENCOUNTER — Telehealth: Admitting: Internal Medicine

## 2024-05-29 ENCOUNTER — Encounter (HOSPITAL_COMMUNITY): Payer: Self-pay | Admitting: Podiatry

## 2024-05-29 ENCOUNTER — Encounter: Payer: Self-pay | Admitting: Internal Medicine

## 2024-05-29 ENCOUNTER — Ambulatory Visit (HOSPITAL_COMMUNITY): Payer: Self-pay | Admitting: Family Medicine

## 2024-05-29 DIAGNOSIS — I110 Hypertensive heart disease with heart failure: Secondary | ICD-10-CM | POA: Diagnosis not present

## 2024-05-29 DIAGNOSIS — I480 Paroxysmal atrial fibrillation: Secondary | ICD-10-CM | POA: Diagnosis not present

## 2024-05-29 DIAGNOSIS — D649 Anemia, unspecified: Secondary | ICD-10-CM | POA: Diagnosis not present

## 2024-05-29 DIAGNOSIS — M19012 Primary osteoarthritis, left shoulder: Secondary | ICD-10-CM | POA: Diagnosis not present

## 2024-05-29 DIAGNOSIS — E11621 Type 2 diabetes mellitus with foot ulcer: Secondary | ICD-10-CM | POA: Insufficient documentation

## 2024-05-29 DIAGNOSIS — I509 Heart failure, unspecified: Secondary | ICD-10-CM | POA: Diagnosis not present

## 2024-05-29 DIAGNOSIS — M19011 Primary osteoarthritis, right shoulder: Secondary | ICD-10-CM | POA: Diagnosis not present

## 2024-05-29 DIAGNOSIS — F419 Anxiety disorder, unspecified: Secondary | ICD-10-CM | POA: Diagnosis not present

## 2024-05-29 DIAGNOSIS — J4489 Other specified chronic obstructive pulmonary disease: Secondary | ICD-10-CM | POA: Diagnosis not present

## 2024-05-29 DIAGNOSIS — E1142 Type 2 diabetes mellitus with diabetic polyneuropathy: Secondary | ICD-10-CM | POA: Diagnosis not present

## 2024-05-29 NOTE — Telephone Encounter (Signed)
   Patient Name: [name]  DOB: 28-Mar-1956 MRN: [id number]  Primary Cardiologist: None  Chart reviewed as part of pre-operative protocol coverage. Given past medical history and time since last visit, based on ACC/AHA guidelines, [name] is at acceptable risk for the planned procedure without further cardiovascular testing.   Per office protocol, patient can hold Eliquis  for 2 days prior to procedure.   Patient will not need bridging with Lovenox  (enoxaparin ) around procedure.  The patient was advised that if he develops new symptoms prior to surgery to contact our office to arrange for a follow-up visit, and he verbalized understanding.  I will route this recommendation to the requesting party via Epic fax function and remove from pre-op  pool.  Please call with questions.  Lamarr Satterfield, NP 05/29/2024, 9:45 AM

## 2024-05-29 NOTE — Progress Notes (Signed)
 Patient ID: [name], male   DOB: 1955-11-10, 68 y.o.   MRN: [id number]  Virtual Visit via Video Note    Pt no show for appt  I still filled out the Preop authorization form, to be forwarded

## 2024-05-29 NOTE — Progress Notes (Signed)
 Anesthesia Chart Review: Same day workup  68 year old male follows with cardiology for history of permanent atrial fibrillation on Eliquis , HLD, chronic systolic heart failure, nonobstructive CAD by cath 2018, nonischemic cardiomyopathy s/p Medtronic CRT-D placement 2018 with AV nodal ablation to promote biventricular pacing.  Echo 10/2023 with LVEF 40 to 45% (interpretation states mild aortic stenosis, MG 8 mmHg [name] 1.85 cm).  GDMT limited by low blood pressure.  Cardiac clearance per telephone encounter 05/29/2024 by Lamarr Satterfield, NP, Chart reviewed as part of pre-operative protocol coverage. Given past medical history and time since last visit, based on ACC/AHA guidelines, [name] is at acceptable risk for the planned procedure without further cardiovascular testing.  Per office protocol, patient can hold Eliquis  for 2 days prior to procedure. Patient will not need bridging with Lovenox  (enoxaparin ) around procedure.  Other pertinent history includes peripheral neuropathy, fibromyalgia, GERD on PPI, OSA with nocturnal hypoxemia intolerant to CPAP (uses 2 L O2), COPD/asthma (on Spiriva , [place]ela, as needed albuterol ), chronic respiratory failure on 2 L supplemental O2, relative adrenal insufficiency (chronic prednisone  10 mg daily secondary to arthritis per patient).SABRA  BMP 05/28/2024 reviewed, unremarkable.  CBC 05/20/2024 reviewed, WBC mildly elevated 14.8, mild anemia hemoglobin 11.0, otherwise unremarkable.  EKG 05/20/2024: Ventricular paced rhythm.  Rate 70.  Perioperative prescription for implanted cardiac device programming per progress note 05/29/2024: Device Information:   Clinic EP Physician:  Danelle Birmingham, MD    Device Type:  Defibrillator Manufacturer and Phone #:  Medtronic: [phone] Pacemaker Dependent?:  Yes.   Date of Last Device Check:  05/25/2024      Normal Device Function?:  Yes.     Electrophysiologist's Recommendations:   Have magnet available. Provide  continuous ECG monitoring when magnet is used or reprogramming is to be performed.  Procedure should not interfere with device function.  No device programming or magnet placement needed.   TTE 11/04/2023:  1. Left ventricular ejection fraction, by estimation, is 40 to 45%. The  left ventricle has mildly decreased function. The left ventricular  internal cavity size was mildly dilated. Left ventricular diastolic  parameters are indeterminate.   2. Right ventricular systolic function is mildly reduced. The right  ventricular size is normal. There is normal pulmonary artery systolic  pressure.   3. Left atrial size was moderately dilated.   4. The mitral valve is normal in structure. Trivial mitral valve  regurgitation.   5. AV is thickened, calcified. Peak and mean gradients through the valve  are 15 and 8 mm Hg respectively. [name] (VTI) is 1.85 cm2 Consistent with  mild aortic stenosis. . The aortic valve is tricuspid. Aortic valve  regurgitation is not visualized. Aortic  valve sclerosis/calcification is present, without any evidence of aortic  stenosis.   Comparison(s): The left ventricular function is unchanged.     Lynwood Geofm RIGGERS [facility]/Anesthesiology Phone [phone] 05/29/2024 11:00 AM

## 2024-05-29 NOTE — Telephone Encounter (Signed)
 Pt stated that he had abdominal pain two nights ago. Pt stated that he did not have abdominal pain last night. Pt stated that he was not having any pain in the day. Only at night for two nights straight. Pt stated hat he had a Good BM yesterday and had no pain last night. Pt stated that he has no other GI symptoms. Recommendations were to monitor symptoms over the weekend and call us  back if symptoms persist.  Pt verbalized understanding with all questions answered.

## 2024-05-29 NOTE — Progress Notes (Signed)
 PERIOPERATIVE PRESCRIPTION FOR IMPLANTED CARDIAC DEVICE PROGRAMMING  Patient Information: Name:  [name]  DOB:  03/22/1956  MRN:  [id number]    Planned Procedure:  AMPUTATION, TOE - Right  Surgeon:  Thresa EMERSON Sar, DPM  Date of Procedure:  06/01/24  Cautery will be used.  Position during surgery:  supine   Please send documentation back to:  [facility] (Fax # [phone])  Device Information:  Clinic EP Physician:  Danelle Birmingham, MD   Device Type:  Defibrillator Manufacturer and Phone #:  Medtronic: [phone] Pacemaker Dependent?:  Yes.   Date of Last Device Check:  05/25/2024  Normal Device Function?:  Yes.    Electrophysiologist's Recommendations:  Have magnet available. Provide continuous ECG monitoring when magnet is used or reprogramming is to be performed.  Procedure should not interfere with device function.  No device programming or magnet placement needed.  Per Device Clinic Standing Orders, Zachary ONEIDA Shutter, RN  8:49 AM 05/29/2024

## 2024-05-29 NOTE — Telephone Encounter (Signed)
 Patient is returning a call back to [name]. Patient is requesting a call back. Please advise.

## 2024-05-29 NOTE — Anesthesia Preprocedure Evaluation (Signed)
 Anesthesia Evaluation  Patient identified by MRN, date of birth, ID band Patient awake    Reviewed: Allergy  & Precautions, NPO status , Patient's Chart, lab work & pertinent test results, reviewed documented beta blocker date and time   Airway Mallampati: II  TM Distance: >3 FB     Dental no notable dental hx. (+) Teeth Intact, Dental Advisory Given   Pulmonary asthma , sleep apnea and Oxygen  sleep apnea , pneumonia, resolved, COPD,  COPD inhaler and oxygen  dependent, former smoker Chronic respiratory failure   breath sounds clear to auscultation + decreased breath sounds      Cardiovascular hypertension, Pt. on medications + Past MI  Normal cardiovascular exam+ dysrhythmias Atrial Fibrillation + Cardiac Defibrillator  Rhythm:Regular Rate:Normal  EKG 05/20/24 V Paced rhythm  Echo 11/04/23 1. Left ventricular ejection fraction, by estimation, is 40 to 45%. The  left ventricle has mildly decreased function. The left ventricular  internal cavity size was mildly dilated. Left ventricular diastolic  parameters are indeterminate.   2. Right ventricular systolic function is mildly reduced. The right  ventricular size is normal. There is normal pulmonary artery systolic  pressure.   3. Left atrial size was moderately dilated.   4. The mitral valve is normal in structure. Trivial mitral valve  regurgitation.   5. AV is thickened, calcified. Peak and mean gradients through the valve  are 15 and 8 mm Hg respectively. [name] (VTI) is 1.85 cm2 Consistent with  mild aortic stenosis. . The aortic valve is tricuspid. Aortic valve  regurgitation is not visualized. Aortic  valve sclerosis/calcification is present, without any evidence of aortic  stenosis.   Cardiac Cath 01/28/17 1. Mildly elevated LV and RV filling pressures though LVEDP is not elevated.  2. Preserved cardiac output.  3. Nonobstructive CAD => nonischemic cardiomyopathy.       Neuro/Psych  PSYCHIATRIC DISORDERS Anxiety Depression    Peripheral neuropathy Glaucoma  Neuromuscular disease    GI/Hepatic Neg liver ROS,GERD  Medicated,,  Endo/Other  diabetes, Well Controlled, Type 2  GLP-1 RA therapy- last dose 9 days ago Obesity HLD  Renal/GU negative Renal ROS  negative genitourinary   Musculoskeletal  (+) Arthritis , Osteoarthritis,  Diabetic foot ulcer right 2nd toe with chronic osteomyelitis   Abdominal  (+) + obese  Peds  Hematology  (+) Blood dyscrasia, anemia Eliquis  therapy - last dose 3 days ago   Anesthesia Other Findings   Reproductive/Obstetrics                              Anesthesia Physical Anesthesia Plan  ASA: 3  Anesthesia Plan: MAC   Post-op Pain Management: Minimal or no pain anticipated   Induction: Intravenous  PONV Risk Score and Plan: 2 and Treatment may vary due to age or medical condition, Propofol  infusion and Ondansetron   Airway Management Planned: Natural Airway and Simple Face Mask  Additional Equipment: None  Intra-op Plan:   Post-operative Plan:   Informed Consent: I have reviewed the patients History and Physical, chart, labs and discussed the procedure including the risks, benefits and alternatives for the proposed anesthesia with the patient or authorized representative who has indicated his/her understanding and acceptance.     Dental advisory given  Plan Discussed with: Anesthesiologist and CRNA  Anesthesia Plan Comments: (PAT note by Lynwood Hope, PA-C:  68 year old male follows with cardiology for history of permanent atrial fibrillation on Eliquis , HLD, chronic systolic heart failure, nonobstructive CAD by  cath 2018, nonischemic cardiomyopathy s/p Medtronic CRT-D placement 2018 with AV nodal ablation to promote biventricular pacing.  Echo 10/2023 with LVEF 40 to 45% (interpretation states mild aortic stenosis, MG 8 mmHg [name] 1.85 cm).  GDMT limited by low blood pressure.   Cardiac clearance per telephone encounter 05/29/2024 by Lamarr Satterfield, NP, Chart reviewed as part of pre-operative protocol coverage. Given past medical history and time since last visit, based on ACC/AHA guidelines, [name] is at acceptable risk for the planned procedure without further cardiovascular testing.  Per office protocol, patient can hold Eliquis  for 2 days prior to procedure. Patient will not need bridging with Lovenox  (enoxaparin ) around procedure.  Other pertinent history includes peripheral neuropathy, fibromyalgia, GERD on PPI, OSA with nocturnal hypoxemia intolerant to CPAP (uses 2 L O2), COPD/asthma (on Spiriva , Wixela, as needed albuterol ), chronic respiratory failure on 2 L supplemental O2, relative adrenal insufficiency (chronic prednisone  10 mg daily secondary to arthritis per patient).SABRA  BMP 05/28/2024 reviewed, unremarkable.  CBC 05/20/2024 reviewed, WBC mildly elevated 14.8, mild anemia hemoglobin 11.0, otherwise unremarkable.  EKG 05/20/2024: Ventricular paced rhythm.  Rate 70.  Perioperative prescription for implanted cardiac device programming per progress note 05/29/2024: Device Information:  Clinic EP Physician:  Danelle Birmingham, MD   Device Type:  Defibrillator Manufacturer and Phone #:  Medtronic: [id number] Pacemaker Dependent?:  Yes.   Date of Last Device Check:  05/25/2024      Normal Device Function?:  Yes.    Electrophysiologist's Recommendations:   Have magnet available.  Provide continuous ECG monitoring when magnet is used or reprogramming is to be performed.   Procedure should not interfere with device function.  No device programming or magnet placement needed.   TTE 11/04/2023: 1. Left ventricular ejection fraction, by estimation, is 40 to 45%. The  left ventricle has mildly decreased function. The left ventricular  internal cavity size was mildly dilated. Left ventricular diastolic  parameters are indeterminate.  2. Right  ventricular systolic function is mildly reduced. The right  ventricular size is normal. There is normal pulmonary artery systolic  pressure.  3. Left atrial size was moderately dilated.  4. The mitral valve is normal in structure. Trivial mitral valve  regurgitation.  5. AV is thickened, calcified. Peak and mean gradients through the valve  are 15 and 8 mm Hg respectively. [name] (VTI) is 1.85 cm2 Consistent with  mild aortic stenosis. . The aortic valve is tricuspid. Aortic valve  regurgitation is not visualized. Aortic  valve sclerosis/calcification is present, without any evidence of aortic  stenosis.   Comparison(s): The left ventricular function is unchanged.   )         Anesthesia Quick Evaluation

## 2024-05-29 NOTE — Telephone Encounter (Signed)
 Left message for pt to call back

## 2024-05-29 NOTE — Progress Notes (Signed)
 PCP - Lynwood Rush, MD Cardiologist - Elspeth JAYSON Sage, MD and Ezra Shuck, MD  PPM/ICD - ICD Device Orders - in Epic 05/29/24 Rep Notified - Donley armin Christians emailed 05/29/24  apixaban  (ELIQUIS )   Chest x-ray - 05/04/24 EKG - 05/20/24 Stress Test - 05/01/18 ECHO - 11/04/23 Cardiac Cath - 03/21/16  CPAP - does not wear  Checks Blood Sugar does not check  tirzepatide  (MOUNJARO ): Last dose on 05/23/24  Blood Thinner Instructions: Per office protocol, patient can hold Eliquis  for 2 days prior to procedure. Per Pharmacist's note on 05/28/24. Last dose 05/28/24  Anesthesia review: Y  Patient verbally denies any shortness of breath, fever, cough and chest pain during phone call   -------------  SDW INSTRUCTIONS given:  Your procedure is scheduled on Monday, Aug 18th.  Report to [facility] Main Entrance A at 0915 A.M., and check in at the Admitting office.  Call this number if you have problems the morning of surgery:  [phone]   Remember:  Do not eat after midnight the night before your surgery  You may drink clear liquids until 0830 the morning of your surgery.   Clear liquids allowed are: Water, Non-Citrus Juices (without pulp), Carbonated Beverages, Clear Tea, Black Coffee Only, and Gatorade    Take these medicines the morning of surgery with A SIP OF WATER  DULoxetine  (CYMBALTA )  linaclotide  (LINZESS )  omeprazole  (PRILOSEC)  oxyCODONE  (ROXICODONE ) predniSONE  (DELTASONE )   tamsulosin  (FLOMAX )  SPIRIVA   traMADol  (ULTRAM )  venlafaxine  XR (EFFEXOR -XR)  WIXELA  acetaminophen  (TYLENOL )-if needed albuterol  (VENTOLIN  HFA)-if needed (Please bring with you on the day of surgery) fluticasone  (FLONASE )-if needed DUONEB-if needed meclizine  (ANTIVERT )-if needed ondansetron  (ZOFRAN -ODT)-if needed   As of today, STOP taking any Aspirin  (unless otherwise instructed by your surgeon) Aleve, Naproxen, Ibuprofen , Motrin , Advil , Goody's, BC's, all herbal medications, fish oil,  and all vitamins.                Do not wear jewelry, make up, or nail polish            Do not wear lotions, powders, perfumes/colognes, or deodorant.            Do not shave 48 hours prior to surgery.  Men may shave face and neck.            Do not bring valuables to the hospital.            [facility] is not responsible for any belongings or valuables.  Do NOT Smoke (Tobacco/Vaping) 24 hours prior to your procedure If you use a CPAP at night, you may bring all equipment for your overnight stay.   Contacts, glasses, dentures or bridgework may not be worn into surgery.      For patients admitted to the hospital, discharge time will be determined by your treatment team.   Patients discharged the day of surgery will not be allowed to drive home, and someone needs to stay with them for 24 hours.    Special instructions:   [facility]- Preparing For Surgery  Before surgery, you can play an important role. Because skin is not sterile, your skin needs to be as free of germs as possible. You can reduce the number of germs on your skin by washing with CHG (chlorahexidine gluconate) Soap before surgery.  CHG is an antiseptic cleaner which kills germs and bonds with the skin to continue killing germs even after washing.    Oral Hygiene is also important to reduce your  risk of infection.  Remember - BRUSH YOUR TEETH THE MORNING OF SURGERY WITH YOUR REGULAR TOOTHPASTE  Please do not use if you have an allergy  to CHG or antibacterial soaps. If your skin becomes reddened/irritated stop using the CHG.  Do not shave (including legs and underarms) for at least 48 hours prior to first CHG shower. It is OK to shave your face.  Please follow these instructions carefully.   Shower the NIGHT BEFORE SURGERY and the MORNING OF SURGERY with DIAL Soap.   Pat yourself dry with a CLEAN TOWEL.  Wear CLEAN PAJAMAS to bed the night before surgery  Place CLEAN SHEETS on your bed the night of your first  shower and DO NOT SLEEP WITH PETS.   Day of Surgery: Please shower morning of surgery  Wear Clean/Comfortable clothing the morning of surgery Do not apply any deodorants/lotions.   Remember to brush your teeth WITH YOUR REGULAR TOOTHPASTE.   Questions were answered. Patient verbalized understanding of instructions.

## 2024-05-31 ENCOUNTER — Other Ambulatory Visit: Payer: Self-pay | Admitting: Family Medicine

## 2024-05-31 ENCOUNTER — Other Ambulatory Visit: Payer: Self-pay | Admitting: Physical Medicine & Rehabilitation

## 2024-05-31 ENCOUNTER — Other Ambulatory Visit: Payer: Self-pay | Admitting: Internal Medicine

## 2024-05-31 DIAGNOSIS — G243 Spasmodic torticollis: Secondary | ICD-10-CM

## 2024-05-31 DIAGNOSIS — S12201D Unspecified nondisplaced fracture of third cervical vertebra, subsequent encounter for fracture with routine healing: Secondary | ICD-10-CM

## 2024-06-01 ENCOUNTER — Ambulatory Visit (HOSPITAL_COMMUNITY)
Admission: RE | Admit: 2024-06-01 | Discharge: 2024-06-01 | Disposition: A | Discharge status: Home / Self Care | Attending: Podiatry | Admitting: Podiatry

## 2024-06-01 ENCOUNTER — Other Ambulatory Visit: Payer: Self-pay

## 2024-06-01 ENCOUNTER — Encounter (HOSPITAL_COMMUNITY)
Admission: RE | Disposition: A | Discharge status: Home / Self Care | Payer: Self-pay | Source: Home / Self Care | Attending: Podiatry

## 2024-06-01 ENCOUNTER — Ambulatory Visit (HOSPITAL_COMMUNITY): Payer: Self-pay | Admitting: Physician Assistant

## 2024-06-01 ENCOUNTER — Encounter (HOSPITAL_COMMUNITY): Payer: Self-pay | Admitting: Podiatry

## 2024-06-01 DIAGNOSIS — M869 Osteomyelitis, unspecified: Secondary | ICD-10-CM | POA: Diagnosis not present

## 2024-06-01 DIAGNOSIS — E11621 Type 2 diabetes mellitus with foot ulcer: Secondary | ICD-10-CM

## 2024-06-01 DIAGNOSIS — I252 Old myocardial infarction: Secondary | ICD-10-CM | POA: Diagnosis not present

## 2024-06-01 DIAGNOSIS — E1142 Type 2 diabetes mellitus with diabetic polyneuropathy: Secondary | ICD-10-CM | POA: Diagnosis not present

## 2024-06-01 DIAGNOSIS — D649 Anemia, unspecified: Secondary | ICD-10-CM | POA: Diagnosis not present

## 2024-06-01 DIAGNOSIS — H409 Unspecified glaucoma: Secondary | ICD-10-CM | POA: Diagnosis not present

## 2024-06-01 DIAGNOSIS — F32A Depression, unspecified: Secondary | ICD-10-CM | POA: Diagnosis not present

## 2024-06-01 DIAGNOSIS — G4730 Sleep apnea, unspecified: Secondary | ICD-10-CM | POA: Diagnosis not present

## 2024-06-01 DIAGNOSIS — L97512 Non-pressure chronic ulcer of other part of right foot with fat layer exposed: Secondary | ICD-10-CM | POA: Diagnosis not present

## 2024-06-01 DIAGNOSIS — I10 Essential (primary) hypertension: Secondary | ICD-10-CM | POA: Diagnosis not present

## 2024-06-01 DIAGNOSIS — F419 Anxiety disorder, unspecified: Secondary | ICD-10-CM | POA: Diagnosis not present

## 2024-06-01 DIAGNOSIS — E08621 Diabetes mellitus due to underlying condition with foot ulcer: Secondary | ICD-10-CM | POA: Diagnosis not present

## 2024-06-01 DIAGNOSIS — Z87891 Personal history of nicotine dependence: Secondary | ICD-10-CM

## 2024-06-01 DIAGNOSIS — Z9981 Dependence on supplemental oxygen: Secondary | ICD-10-CM | POA: Diagnosis not present

## 2024-06-01 DIAGNOSIS — J9610 Chronic respiratory failure, unspecified whether with hypoxia or hypercapnia: Secondary | ICD-10-CM | POA: Insufficient documentation

## 2024-06-01 DIAGNOSIS — K219 Gastro-esophageal reflux disease without esophagitis: Secondary | ICD-10-CM | POA: Diagnosis not present

## 2024-06-01 DIAGNOSIS — E1169 Type 2 diabetes mellitus with other specified complication: Secondary | ICD-10-CM | POA: Diagnosis not present

## 2024-06-01 DIAGNOSIS — D759 Disease of blood and blood-forming organs, unspecified: Secondary | ICD-10-CM | POA: Insufficient documentation

## 2024-06-01 DIAGNOSIS — I4891 Unspecified atrial fibrillation: Secondary | ICD-10-CM | POA: Diagnosis not present

## 2024-06-01 DIAGNOSIS — L97519 Non-pressure chronic ulcer of other part of right foot with unspecified severity: Secondary | ICD-10-CM | POA: Insufficient documentation

## 2024-06-01 DIAGNOSIS — J449 Chronic obstructive pulmonary disease, unspecified: Secondary | ICD-10-CM | POA: Insufficient documentation

## 2024-06-01 DIAGNOSIS — M86171 Other acute osteomyelitis, right ankle and foot: Secondary | ICD-10-CM | POA: Diagnosis not present

## 2024-06-01 HISTORY — PX: AMPUTATION TOE: SHX6595

## 2024-06-01 LAB — SURGICAL PCR SCREEN
MRSA, PCR: NEGATIVE
Staphylococcus aureus: NEGATIVE

## 2024-06-01 LAB — GLUCOSE, CAPILLARY
Glucose-Capillary: 98 mg/dL (ref 70–99)
Glucose-Capillary: 94 mg/dL (ref 70–99)
Glucose-Capillary: 98 mg/dL (ref 70–99)

## 2024-06-01 SURGERY — AMPUTATION, TOE
Anesthesia: Monitor Anesthesia Care | Site: Toe | Laterality: Right

## 2024-06-01 MED ORDER — MIDAZOLAM HCL 2 MG/2ML IJ SOLN
INTRAMUSCULAR | Status: AC
  Filled 2024-06-01: qty 2

## 2024-06-01 MED ORDER — LACTATED RINGERS IV SOLN
INTRAVENOUS | Status: DC | PRN
Start: 2024-06-01 — End: 2024-06-01

## 2024-06-01 MED ORDER — OXYCODONE HCL 5 MG/5ML PO SOLN: 5.0000 mg | Freq: Once | ORAL | Status: DC | PRN

## 2024-06-01 MED ORDER — PHENYLEPHRINE HCL (PRESSORS) 10 MG/ML IV SOLN
INTRAVENOUS | Status: DC | PRN
  Administered 2024-06-01 (×2): 80 ug via INTRAVENOUS

## 2024-06-01 MED ORDER — INSULIN ASPART 100 UNIT/ML IJ SOLN: 0.0000 [IU] | INTRAMUSCULAR | Status: DC | PRN

## 2024-06-01 MED ORDER — BUPIVACAINE-EPINEPHRINE (PF) 0.5% -1:200000 IJ SOLN
INTRAMUSCULAR | Status: AC
  Filled 2024-06-01: qty 30

## 2024-06-01 MED ORDER — HEMOSTATIC AGENTS (NO CHARGE) OPTIME
TOPICAL | Status: DC | PRN
Start: 2024-06-01 — End: 2024-06-01
  Administered 2024-06-01: 1 via TOPICAL

## 2024-06-01 MED ORDER — LIDOCAINE HCL 2 % IJ SOLN
INTRAMUSCULAR | Status: AC
  Filled 2024-06-01: qty 20

## 2024-06-01 MED ORDER — CEFAZOLIN SODIUM-DEXTROSE 3-4 GM/150ML-% IV SOLN
3.0000 g | INTRAVENOUS | Status: AC
  Administered 2024-06-01: 3 g via INTRAVENOUS
  Filled 2024-06-01: qty 150

## 2024-06-01 MED ORDER — OXYCODONE HCL 5 MG PO TABS: 5.0000 mg | ORAL_TABLET | Freq: Once | ORAL | Status: DC | PRN

## 2024-06-01 MED ORDER — PROPOFOL 500 MG/50ML IV EMUL
INTRAVENOUS | Status: DC | PRN
  Administered 2024-06-01: 50 ug/kg/min via INTRAVENOUS

## 2024-06-01 MED ORDER — 0.9 % SODIUM CHLORIDE (POUR BTL) OPTIME
TOPICAL | Status: DC | PRN
  Administered 2024-06-01: 1000 mL

## 2024-06-01 MED ORDER — ORAL CARE MOUTH RINSE: 15.0000 mL | Freq: Once | OROMUCOSAL | Status: AC

## 2024-06-01 MED ORDER — HYDROMORPHONE HCL 1 MG/ML IJ SOLN: 0.2500 mg | INTRAMUSCULAR | Status: DC | PRN

## 2024-06-01 MED ORDER — BUPIVACAINE HCL (PF) 0.5 % IJ SOLN
INTRAMUSCULAR | Status: AC
  Filled 2024-06-01: qty 30

## 2024-06-01 MED ORDER — MIDAZOLAM HCL 5 MG/5ML IJ SOLN
INTRAMUSCULAR | Status: DC | PRN
  Administered 2024-06-01 (×2): 1 mg via INTRAVENOUS

## 2024-06-01 MED ORDER — DROPERIDOL 2.5 MG/ML IJ SOLN: 0.6250 mg | Freq: Once | INTRAMUSCULAR | Status: DC | PRN

## 2024-06-01 MED ORDER — ONDANSETRON HCL 4 MG/2ML IJ SOLN: 4.0000 mg | Freq: Once | INTRAMUSCULAR | Status: DC | PRN

## 2024-06-01 MED ORDER — LIDOCAINE HCL (PF) 1 % IJ SOLN
INTRAMUSCULAR | Status: AC
  Filled 2024-06-01: qty 30

## 2024-06-01 MED ORDER — DOXYCYCLINE HYCLATE 100 MG PO TABS: 100.0000 mg | ORAL_TABLET | Freq: Two times a day (BID) | ORAL | 0 refills | Status: DC

## 2024-06-01 MED ORDER — CHLORHEXIDINE GLUCONATE 0.12 % MT SOLN
15.0000 mL | Freq: Once | OROMUCOSAL | Status: AC
  Administered 2024-06-01: 15 mL via OROMUCOSAL
  Filled 2024-06-01: qty 15

## 2024-06-01 MED ORDER — OXYCODONE HCL 5 MG PO TABS: 5.0000 mg | ORAL_TABLET | Freq: Four times a day (QID) | ORAL | 0 refills | Status: DC | PRN

## 2024-06-01 MED ORDER — LACTATED RINGERS IV SOLN: INTRAVENOUS | Status: DC

## 2024-06-01 MED ORDER — LIDOCAINE HCL 2 % IJ SOLN
INTRAMUSCULAR | Status: DC | PRN
  Administered 2024-06-01: 10 mL via SURGICAL_CAVITY

## 2024-06-01 MED ORDER — PROPOFOL 10 MG/ML IV BOLUS
INTRAVENOUS | Status: AC
  Filled 2024-06-01: qty 20

## 2024-06-01 SURGICAL SUPPLY — 39 items
BAG COUNTER SPONGE SURGICOUNT (BAG) ×2 IMPLANT
BLADE SURG 15 STRL LF DISP TIS (BLADE) IMPLANT
BNDG COMPR ESMARK 4X3 LF (GAUZE/BANDAGES/DRESSINGS) IMPLANT
BNDG ELASTIC 4INX 5YD STR LF (GAUZE/BANDAGES/DRESSINGS) IMPLANT
BNDG ELASTIC 4X5.8 VLCR STR LF (GAUZE/BANDAGES/DRESSINGS) IMPLANT
BNDG GAUZE DERMACEA FLUFF 4 (GAUZE/BANDAGES/DRESSINGS) ×2 IMPLANT
CHLORAPREP W/TINT 26 (MISCELLANEOUS) IMPLANT
COVER SURGICAL LIGHT HANDLE (MISCELLANEOUS) ×1 IMPLANT
CUFF TOURN SGL QUICK 18X4 (TOURNIQUET CUFF) ×1 IMPLANT
CUFF TRNQT CYL 24X4X16.5-23 (TOURNIQUET CUFF) IMPLANT
DRSG XEROFORM 1X8 (GAUZE/BANDAGES/DRESSINGS) IMPLANT
ELECTRODE REM PT RTRN 9FT ADLT (ELECTROSURGICAL) IMPLANT
GAUZE PAD ABD 8X10 STRL (GAUZE/BANDAGES/DRESSINGS) ×2 IMPLANT
GAUZE SPONGE 4X4 12PLY STRL (GAUZE/BANDAGES/DRESSINGS) ×2 IMPLANT
GAUZE XEROFORM 1X8 LF (GAUZE/BANDAGES/DRESSINGS) ×1 IMPLANT
GLOVE BIO SURGEON STRL SZ8 (GLOVE) ×1 IMPLANT
GLOVE BIOGEL PI IND STRL 8 (GLOVE) ×1 IMPLANT
GOWN STRL REUS W/ TWL LRG LVL3 (GOWN DISPOSABLE) ×2 IMPLANT
HEMOSTAT SURGICEL .5X2 ABSORB (HEMOSTASIS) IMPLANT
KIT BASIN OR (CUSTOM PROCEDURE TRAY) ×2 IMPLANT
KIT TURNOVER KIT B (KITS) ×2 IMPLANT
NDL PRECISIONGLIDE 27X1.5 (NEEDLE) ×1 IMPLANT
NEEDLE PRECISIONGLIDE 27X1.5 (NEEDLE) ×1 IMPLANT
NS IRRIG 1000ML POUR BTL (IV SOLUTION) ×1 IMPLANT
PACK ORTHO EXTREMITY (CUSTOM PROCEDURE TRAY) ×1 IMPLANT
PAD ARMBOARD POSITIONER FOAM (MISCELLANEOUS) ×2 IMPLANT
PAD CAST 4YDX4 CTTN HI CHSV (CAST SUPPLIES) ×1 IMPLANT
PENCIL BUTTON HOLSTER BLD 10FT (ELECTRODE) IMPLANT
SOL PREP POV-IOD 4OZ 10% (MISCELLANEOUS) ×1 IMPLANT
STAPLER SKIN PROX 35W (STAPLE) IMPLANT
SUT PROLENE 4 0 PS 2 18 (SUTURE) IMPLANT
SUT VIC AB 3-0 PS2 18XBRD (SUTURE) IMPLANT
SUT VIC AB 4-0 PS2 18 (SUTURE) IMPLANT
SYR CONTROL 10ML LL (SYRINGE) ×1 IMPLANT
TOWEL GREEN STERILE (TOWEL DISPOSABLE) ×1 IMPLANT
TOWEL GREEN STERILE FF (TOWEL DISPOSABLE) ×1 IMPLANT
TRAY CATH INTERMITTENT SS 16FR (CATHETERS) IMPLANT
TUBE CONNECTING 12X1/4 (SUCTIONS) ×1 IMPLANT
YANKAUER SUCT BULB TIP NO VENT (SUCTIONS) IMPLANT

## 2024-06-01 NOTE — Interval H&P Note (Signed)
 History and Physical Interval Note:  06/01/2024 12:19 PM  [name]  has presented today for surgery, with the diagnosis of DIABETIC OF RIGHT TOE ULCER.  The various methods of treatment have been discussed with the patient and family. After consideration of risks, benefits and other options for treatment, the patient has consented to  Procedure(s) with comments: AMPUTATION, TOE (Right) - RIGHT 2ND TOE IV SEDATION as a surgical intervention.  The patient's history has been reviewed, patient examined, no change in status, stable for surgery.  I have reviewed the patient's chart and labs.  Questions were answered to the patient's satisfaction.     [name]

## 2024-06-01 NOTE — Progress Notes (Signed)
 Orthopedic Tech Progress Note Patient Details:  [name] [birth year] [id number]  Ortho Devices Type of Ortho Device: Postop shoe/boot Ortho Device/Splint Location: RLE Ortho Device/Splint Interventions: Ordered, Application   Post Interventions Patient Tolerated: Well Instructions Provided: Care of device   Kaleiyah Polsky L Marayah Higdon 06/01/2024, 1:39 PM

## 2024-06-01 NOTE — Transfer of Care (Signed)
 Immediate Anesthesia Transfer of Care Note  Patient: [name]  Procedure(s) Performed: AMPUTATION, TOE (Right: Toe)  Patient Location: PACU  Anesthesia Type:MAC  Level of Consciousness: awake, alert , oriented, and patient cooperative  Airway & Oxygen  Therapy: Patient Spontanous Breathing and Patient connected to face mask oxygen   Post-op Assessment: Report given to RN, Post -op Vital signs reviewed and stable, Patient moving all extremities, Patient moving all extremities X 4, and Patient able to stick tongue midline  Post vital signs: Reviewed and stable  Last Vitals:  Vitals Value Taken Time  BP 108/55 06/01/24 13:15  Temp 36.3 C 06/01/24 13:15  Pulse 69 06/01/24 13:19  Resp 18 06/01/24 13:19  SpO2 98 % 06/01/24 13:19  Vitals shown include unfiled device data.  Last Pain:  Vitals:   06/01/24 1003  TempSrc:   PainSc: 0-No pain         Complications: No notable events documented.

## 2024-06-01 NOTE — Brief Op Note (Signed)
 06/01/2024  1:07 PM  PATIENT:  [name]  68 y.o. male  PRE-OPERATIVE DIAGNOSIS:  DIABETIC OF RIGHT TOE ULCER  POST-OPERATIVE DIAGNOSIS:  DIABETIC OF RIGHT TOE ULCER  PROCEDURE:  Procedure(s) with comments: AMPUTATION, TOE (Right) - RIGHT 2ND TOE IV SEDATION  SURGEON:  Surgeons and Role:    DEWAINE Janit Thresa CHRISTELLA, DPM - Primary  PHYSICIAN ASSISTANT:   ASSISTANTS: none   ANESTHESIA:   local and MAC  EBL:  10mL   BLOOD ADMINISTERED:none  DRAINS: none   LOCAL MEDICATIONS USED:  MARCAINE    , LIDOCAINE  , and Amount: 10 ml  SPECIMEN:  No Specimen  DISPOSITION OF SPECIMEN:  N/A  COUNTS:  YES  TOURNIQUET:   Total Tourniquet Time Documented: Calf (Right) - 20 minutes Total: Calf (Right) - 20 minutes   DICTATION: .Nechama Dictation  PLAN OF CARE: Discharge to home after PACU  PATIENT DISPOSITION:  PACU - hemodynamically stable.   Delay start of Pharmacological VTE agent (>24hrs) due to surgical blood loss or risk of bleeding: no  Thresa EMERSON Janit, DPM [facility]  Dr. Thresa EMERSON Janit, DPM    [address] [place], [place] [address]                Office [phone]  Fax [phone]

## 2024-06-01 NOTE — Anesthesia Postprocedure Evaluation (Signed)
 Anesthesia Post Note  Patient: [name]  Procedure(s) Performed: AMPUTATION, TOE (Right: Toe)     Patient location during evaluation: PACU Anesthesia Type: MAC Level of consciousness: awake and alert and oriented Pain management: pain level controlled Vital Signs Assessment: post-procedure vital signs reviewed and stable Respiratory status: spontaneous breathing, nonlabored ventilation and respiratory function stable Cardiovascular status: stable and blood pressure returned to baseline Postop Assessment: no apparent nausea or vomiting Anesthetic complications: no   No notable events documented.  Last Vitals:  Vitals:   06/01/24 1315 06/01/24 1330  BP: (!) 108/55 109/62  Pulse: 69 70  Resp: 19 15  Temp: (!) 36.3 C (!) 36.4 C  SpO2: 99% 98%    Last Pain:  Vitals:   06/01/24 1330  TempSrc:   PainSc: 0-No pain                 Haydin Calandra A.

## 2024-06-01 NOTE — H&P (Signed)
 Anesthesia H&P Update: History and Physical Exam reviewed; patient is OK for planned anesthetic and procedure. ? ?

## 2024-06-02 ENCOUNTER — Encounter (HOSPITAL_COMMUNITY): Payer: Self-pay | Admitting: Podiatry

## 2024-06-03 DIAGNOSIS — I110 Hypertensive heart disease with heart failure: Secondary | ICD-10-CM | POA: Diagnosis not present

## 2024-06-03 DIAGNOSIS — M19011 Primary osteoarthritis, right shoulder: Secondary | ICD-10-CM | POA: Diagnosis not present

## 2024-06-03 DIAGNOSIS — I480 Paroxysmal atrial fibrillation: Secondary | ICD-10-CM | POA: Diagnosis not present

## 2024-06-03 DIAGNOSIS — E1142 Type 2 diabetes mellitus with diabetic polyneuropathy: Secondary | ICD-10-CM | POA: Diagnosis not present

## 2024-06-03 DIAGNOSIS — I509 Heart failure, unspecified: Secondary | ICD-10-CM | POA: Diagnosis not present

## 2024-06-03 DIAGNOSIS — M19012 Primary osteoarthritis, left shoulder: Secondary | ICD-10-CM | POA: Diagnosis not present

## 2024-06-03 DIAGNOSIS — D649 Anemia, unspecified: Secondary | ICD-10-CM | POA: Diagnosis not present

## 2024-06-03 DIAGNOSIS — F419 Anxiety disorder, unspecified: Secondary | ICD-10-CM | POA: Diagnosis not present

## 2024-06-03 DIAGNOSIS — J4489 Other specified chronic obstructive pulmonary disease: Secondary | ICD-10-CM | POA: Diagnosis not present

## 2024-06-04 NOTE — Op Note (Signed)
   OPERATIVE REPORT Patient name: [name] MRN: [id number] DOB: [birth year]  DOS: 06/04/24  Preop Dx: Osteomyelitis right second toe Postop Dx: same  Procedure:  1.  Amputation right second toe  Surgeon: Thresa EMERSON Sar DPM  Anesthesia: 50-50 mixture of 2% lidocaine  plain with 0.5% Marcaine  plain totaling 10 mL infiltrated in the patient's right  Hemostasis: Calf tourniquet inflated to a pressure of after esmarch exsanguination   EBL: Minimal mL Materials: None Injectables: None Pathology: None  Condition: The patient tolerated the procedure and anesthesia well. No complications noted or reported   Justification for procedure: The patient presents for surgical correction of osteomyelitis to the right second toe.  The patient was told benefits as well as possible side effects of the surgery. The patient consented for surgical correction. The patient consent form was reviewed. All patient questions were answered. No guarantees were expressed or implied. The patient and the surgeon both signed the patient consent form placed in the patient's chart.   Procedure in Detail: The patient was brought to the operating room in the supine position at which time an aseptic scrub and drape were performed about the patient's lower extremity after anesthesia was induced as described above. Attention was then directed to the surgical area where procedure commenced.  Procedure #1: Amputation right second toe A racquet type incision was planned and made about the base of the infected toe.  Incision was carried down to the level of the MTP joint which time a capsulotomy was performed to disarticulate the joint at the level of the MTP.  The toe was distracted distally and disarticulated and removed in toto.  Any exposed tendons were distracted distally and cut as far proximal as could be visualized. Any necrotic tissue was excisionally debrided using a #15 scalpel down to healthier margins.  At  this time irrigation was performed using bulb syringe and normal saline in preparation for primary closure.  4-0 Prolene suture was utilized to reapproximate skin edges for primary closure.  Dry sterile compressive dressings were then applied to all previously mentioned incision sites about the patient's lower extremity. The tourniquet which was used for hemostasis was deflated. All normal neurovascular responses including pink color and warmth returned all the digits of patient's lower extremity.  The patient was then transferred from the operating room to the recovery room having tolerated the procedure and anesthesia well. All vital signs are stable. After a brief stay in the recovery room the patient was readmitted to inpatient room with postoperative orders placed.    IMPRESSION: Clean margins  Thresa EMERSON Sar, DPM [facility]  Dr. Thresa EMERSON Sar, DPM    [name] [place], [place] [address]                Office [phone]  Fax [phone]

## 2024-06-05 ENCOUNTER — Telehealth: Payer: Self-pay | Admitting: Gastroenterology

## 2024-06-05 DIAGNOSIS — I509 Heart failure, unspecified: Secondary | ICD-10-CM | POA: Diagnosis not present

## 2024-06-05 DIAGNOSIS — I480 Paroxysmal atrial fibrillation: Secondary | ICD-10-CM | POA: Diagnosis not present

## 2024-06-05 DIAGNOSIS — F419 Anxiety disorder, unspecified: Secondary | ICD-10-CM | POA: Diagnosis not present

## 2024-06-05 DIAGNOSIS — J4489 Other specified chronic obstructive pulmonary disease: Secondary | ICD-10-CM | POA: Diagnosis not present

## 2024-06-05 DIAGNOSIS — I110 Hypertensive heart disease with heart failure: Secondary | ICD-10-CM | POA: Diagnosis not present

## 2024-06-05 DIAGNOSIS — M19011 Primary osteoarthritis, right shoulder: Secondary | ICD-10-CM | POA: Diagnosis not present

## 2024-06-05 DIAGNOSIS — D649 Anemia, unspecified: Secondary | ICD-10-CM | POA: Diagnosis not present

## 2024-06-05 DIAGNOSIS — E1142 Type 2 diabetes mellitus with diabetic polyneuropathy: Secondary | ICD-10-CM | POA: Diagnosis not present

## 2024-06-05 DIAGNOSIS — M19012 Primary osteoarthritis, left shoulder: Secondary | ICD-10-CM | POA: Diagnosis not present

## 2024-06-05 NOTE — Telephone Encounter (Signed)
 Spoke with patient about Dr. Ira recommendations to only proceed with the EGD for now.  Will need to refer him back to [facility] for the colonoscopy due to need for special equipment.  Patient verbalizes understanding.

## 2024-06-10 ENCOUNTER — Encounter: Payer: Self-pay | Admitting: Podiatry

## 2024-06-10 ENCOUNTER — Ambulatory Visit (INDEPENDENT_AMBULATORY_CARE_PROVIDER_SITE_OTHER): Admitting: Podiatry

## 2024-06-10 ENCOUNTER — Ambulatory Visit (INDEPENDENT_AMBULATORY_CARE_PROVIDER_SITE_OTHER)

## 2024-06-10 VITALS — BP 106/86 | HR 120 | Temp 98.1°F | Ht 70.0 in | Wt 250.0 lb

## 2024-06-10 DIAGNOSIS — L97512 Non-pressure chronic ulcer of other part of right foot with fat layer exposed: Secondary | ICD-10-CM | POA: Diagnosis not present

## 2024-06-10 DIAGNOSIS — E08621 Diabetes mellitus due to underlying condition with foot ulcer: Secondary | ICD-10-CM

## 2024-06-10 MED ORDER — OXYCODONE-ACETAMINOPHEN 5-325 MG PO TABS: 1.0000 | ORAL_TABLET | Freq: Four times a day (QID) | ORAL | 0 refills | Status: AC | PRN

## 2024-06-10 NOTE — Addendum Note (Signed)
 Addended by: JANIT THRESA HERO on: 06/10/2024 03:50 PM   Modules accepted: Orders

## 2024-06-10 NOTE — Progress Notes (Signed)
 Chief Complaint  Patient presents with   Routine Post Op    POV # 1 DOS 06/01/24 RT 2ND TOE AMPUTATION, pt states still some pain to foot when he is on the foot.    Laser Treatment    Subjective:  Patient presents today status post right second toe amputation.  DOS: 06/01/2024.  Doing well.  WBAT surgical shoe as instructed.  Past Medical History:  Diagnosis Date   AICD (automatic cardioverter/defibrillator) present    Anemia    supposed to be taking Vit B but doesn't   ANXIETY    takes Xanax  nightly   Arthritis    Asthma    Albuterol  prn and Advair  daily;also takes Prednisone  daily   Cardiomyopathy [facility])    a. EF 25% TEE July 2013; b. EF normalized 2015;  c. 03/2015 Echo: EF 40-45%, difrf HK, PASP 38 mmHg, Mild MR, sev LAE/RAE.   Chronic constipation    takes OTC stool softener   COPD (chronic obstructive pulmonary disease) (HCC)    one dr says COPD; one dr says emphysema (09/18/2017)   COVID-19 12/09/2019   DEPRESSION    takes Zoloft  and Doxepin  daily   Diverticulitis    DYSKINESIA, ESOPHAGUS    Essential hypertension        FIBROMYALGIA    GERD (gastroesophageal reflux disease)        Glaucoma    HYPERLIPIDEMIA    a. Intolerant to statins.   INSOMNIA    takes Ambien  nightly   Myocardial infarction [facility])    a. 2012 Myoview  notable for prior infarct;  b. 03/2015 Lexiscan  CL: EF 37%, diff HK, small area of inferior infarct from apex to base-->Med Rx.   O2 dependent    2.5L q hs & prn (09/18/2017)   Paroxysmal atrial fibrillation (HCC)    a. CHA2DS2VASc = 3--> takes Coumadin ;  b. 03/15/2015 Successful TEE/DCCV;  c. 03/2015 recurrent afib, Amio d/c'd in setting of hyperthyroidism.   Peripheral neuropathy    Pneumonia 12/2016   Rash and other nonspecific skin eruption 04/12/2009   no cause found saw dermatologists x 2 and allergist   SLEEP APNEA, OBSTRUCTIVE    a. doesn't use CPAP   Syncope    a. 03/2015 s/p MDT LINQ.   Type II diabetes mellitus (HCC)          Past Surgical History:  Procedure Laterality Date   ACNE CYST REMOVAL     2 on back    AMPUTATION TOE Right 06/01/2024   Procedure: AMPUTATION, TOE;  Surgeon: Janit Thresa HERO, DPM;  [facility]: [facility] OR;  Service: Orthopedics/Podiatry;  Laterality: Right;  RIGHT 2ND TOE IV SEDATION   AV NODE ABLATION N/A 10/25/2017   Procedure: AV NODE ABLATION;  Surgeon: Fernande Elspeth BROCKS, MD;  Location: [facility] INVASIVE CV LAB;  Service: Cardiovascular;  Laterality: N/A;   BIV ICD INSERTION CRT-D N/A 09/18/2017   Procedure: BIV ICD INSERTION CRT-D;  Surgeon: Fernande Elspeth BROCKS, MD;  Location: [facility] INVASIVE CV LAB;  Service: Cardiovascular;  Laterality: N/A;   CARDIAC CATHETERIZATION N/A 03/21/2016   Procedure: Right/Left Heart Cath and Coronary Angiography;  Surgeon: Ezra GORMAN Shuck, MD;  Location: [facility] INVASIVE CV LAB;  Service: Cardiovascular;  Laterality: N/A;   CARDIOVERSION  04/18/2012   Procedure: CARDIOVERSION;  Surgeon: Vina LULLA Gull, MD;  Location: [facility] OR;  Service: Cardiovascular;  Laterality: N/A;   CARDIOVERSION  04/25/2012   Procedure: CARDIOVERSION;  Surgeon: Aleene JINNY Passe, MD;  Location: [facility] ENDOSCOPY;  Service: Cardiovascular;  Laterality: N/A;   CARDIOVERSION  04/25/2012   Procedure: CARDIOVERSION;  Surgeon: Vina LULLA Gull, MD;  Location: [facility] OR;  Service: Cardiovascular;  Laterality: N/A;   CARDIOVERSION  05/09/2012   Procedure: CARDIOVERSION;  Surgeon: Ozell Fell, MD;  Location: [facility] OR;  Service: Cardiovascular;  Laterality: N/A;  changed from crenshaw to cooper by trish/leone-endo   CARDIOVERSION N/A 03/15/2015   Procedure: CARDIOVERSION;  Surgeon: Aleene JINNY Passe, MD;  Location: [facility] ENDOSCOPY;  Service: Cardiovascular;  Laterality: N/A;   COLONOSCOPY     COLONOSCOPY WITH PROPOFOL  N/A 10/21/2014   Procedure: COLONOSCOPY WITH PROPOFOL ;  Surgeon: Gwendlyn ONEIDA Buddy, MD;  Location: [facility] ENDOSCOPY;  Service: Endoscopy;  Laterality: N/A;   EP IMPLANTABLE DEVICE N/A 04/06/2015   Procedure: Loop Recorder Insertion;  Surgeon:  Danelle LELON Birmingham, MD;  Location: [facility] INVASIVE CV LAB;  Service: Cardiovascular;  Laterality: N/A;   ESOPHAGOGASTRODUODENOSCOPY     JOINT REPLACEMENT     LOOP RECORDER REMOVAL N/A 09/18/2017   Procedure: LOOP RECORDER REMOVAL;  Surgeon: Fernande Elspeth BROCKS, MD;  Location: [facility] INVASIVE CV LAB;  Service: Cardiovascular;  Laterality: N/A;   RIGHT/LEFT HEART CATH AND CORONARY ANGIOGRAPHY N/A 01/28/2017   Procedure: Right/Left Heart Cath and Coronary Angiography;  Surgeon: Ezra GORMAN Shuck, MD;  Location: [facility] INVASIVE CV LAB;  Service: Cardiovascular;  Laterality: N/A;   TEE WITHOUT CARDIOVERSION  04/25/2012   Procedure: TRANSESOPHAGEAL ECHOCARDIOGRAM (TEE);  Surgeon: Aleene JINNY Passe, MD;  Location: [facility] ENDOSCOPY;  Service: Cardiovascular;  Laterality: N/A;   TEE WITHOUT CARDIOVERSION N/A 03/15/2015   Procedure: TRANSESOPHAGEAL ECHOCARDIOGRAM (TEE);  Surgeon: Aleene JINNY Passe, MD;  Location: [facility] ENDOSCOPY;  Service: Cardiovascular;  Laterality: N/A;   TONSILLECTOMY AND ADENOIDECTOMY     TOTAL KNEE ARTHROPLASTY Right 06/15/2014   Procedure: TOTAL KNEE ARTHROPLASTY;  Surgeon: Evalene JONETTA Chancy, MD;  Location: [facility] OR;  Service: Orthopedics;  Laterality: Right;   TOTAL KNEE ARTHROPLASTY Left 10/17/2021   Procedure: Left TOTAL KNEE ARTHROPLASTY;  Surgeon: Vernetta Lonni GRADE, MD;  Location: [facility] OR;  Service: Orthopedics;  Laterality: Left;    Allergies  Allergen Reactions   Amiodarone  Other (See Comments)    Caused hyperthyroidism    Statins Other (See Comments)    Myalgias    Sulfa  Antibiotics Other (See Comments)    Mouth ulcers   Wound Dressing Adhesive Other (See Comments)    Skin Tears Use Paper Tape Only    Objective/Physical Exam Neurovascular status intact.  Incision well coapted with sutures intact. No sign of infectious process noted. No dehiscence. No active bleeding noted.  Negative for any appreciable edema.  Overall the toe amputation site is healing nicely  Radiographic Exam RT foot 06/10/2024:   Interval amputation of the second digit at the level of the MTP with routine healing  Assessment: 1. s/p right second toe amputation. DOS: 06/10/2024   Plan of Care:  -Patient was evaluated. X-rays reviewed - Okay to wash and shower and get the foot wet -Recommend Silvadene  cream over the incision site daily -Okay to transfer out of the postop shoe into good supportive slides -Return to clinic 2 weeks suture removal   Thresa EMERSON Sar, DPM [facility]  Dr. Thresa EMERSON Sar, DPM    [address], [place] [address]  Office [phone]  Fax [phone]

## 2024-06-11 ENCOUNTER — Telehealth: Payer: Self-pay

## 2024-06-11 ENCOUNTER — Other Ambulatory Visit: Payer: Self-pay

## 2024-06-11 DIAGNOSIS — M19012 Primary osteoarthritis, left shoulder: Secondary | ICD-10-CM | POA: Diagnosis not present

## 2024-06-11 DIAGNOSIS — F419 Anxiety disorder, unspecified: Secondary | ICD-10-CM | POA: Diagnosis not present

## 2024-06-11 DIAGNOSIS — D649 Anemia, unspecified: Secondary | ICD-10-CM | POA: Diagnosis not present

## 2024-06-11 DIAGNOSIS — K222 Esophageal obstruction: Secondary | ICD-10-CM

## 2024-06-11 DIAGNOSIS — R1319 Other dysphagia: Secondary | ICD-10-CM

## 2024-06-11 DIAGNOSIS — I480 Paroxysmal atrial fibrillation: Secondary | ICD-10-CM | POA: Diagnosis not present

## 2024-06-11 DIAGNOSIS — J4489 Other specified chronic obstructive pulmonary disease: Secondary | ICD-10-CM | POA: Diagnosis not present

## 2024-06-11 DIAGNOSIS — E1142 Type 2 diabetes mellitus with diabetic polyneuropathy: Secondary | ICD-10-CM | POA: Diagnosis not present

## 2024-06-11 DIAGNOSIS — I110 Hypertensive heart disease with heart failure: Secondary | ICD-10-CM | POA: Diagnosis not present

## 2024-06-11 DIAGNOSIS — K219 Gastro-esophageal reflux disease without esophagitis: Secondary | ICD-10-CM

## 2024-06-11 DIAGNOSIS — I509 Heart failure, unspecified: Secondary | ICD-10-CM | POA: Diagnosis not present

## 2024-06-11 DIAGNOSIS — M19011 Primary osteoarthritis, right shoulder: Secondary | ICD-10-CM | POA: Diagnosis not present

## 2024-06-11 NOTE — Telephone Encounter (Signed)
 [facility] Pre-operative Risk Assessment     Request for surgical clearance:     Endoscopy Procedure  What type of surgery is being performed?     Endoscopic  When is this surgery scheduled?   08/17/24  What type of clearance is required ?   Pharmacy  Are there any medications that need to be held prior to surgery and how long? Eliquis  2 days  Practice name and name of physician performing surgery?      [facility]  What is your office phone and fax number?      Phone- [phone]  Fax- [phone]  Anesthesia type (None, local, MAC, general) ?       MAC   Please route your response to Karna Louder, CMA

## 2024-06-11 NOTE — Telephone Encounter (Signed)
 Pharmacy please advise on holding Eliquis  prior to endoscopy scheduled for 08/17/2024. Thank you.

## 2024-06-12 DIAGNOSIS — F419 Anxiety disorder, unspecified: Secondary | ICD-10-CM | POA: Diagnosis not present

## 2024-06-12 DIAGNOSIS — E1142 Type 2 diabetes mellitus with diabetic polyneuropathy: Secondary | ICD-10-CM | POA: Diagnosis not present

## 2024-06-12 DIAGNOSIS — I509 Heart failure, unspecified: Secondary | ICD-10-CM | POA: Diagnosis not present

## 2024-06-12 DIAGNOSIS — I110 Hypertensive heart disease with heart failure: Secondary | ICD-10-CM | POA: Diagnosis not present

## 2024-06-12 DIAGNOSIS — I480 Paroxysmal atrial fibrillation: Secondary | ICD-10-CM | POA: Diagnosis not present

## 2024-06-12 DIAGNOSIS — M19011 Primary osteoarthritis, right shoulder: Secondary | ICD-10-CM | POA: Diagnosis not present

## 2024-06-12 DIAGNOSIS — D649 Anemia, unspecified: Secondary | ICD-10-CM | POA: Diagnosis not present

## 2024-06-12 DIAGNOSIS — M19012 Primary osteoarthritis, left shoulder: Secondary | ICD-10-CM | POA: Diagnosis not present

## 2024-06-12 DIAGNOSIS — J4489 Other specified chronic obstructive pulmonary disease: Secondary | ICD-10-CM | POA: Diagnosis not present

## 2024-06-12 NOTE — Telephone Encounter (Signed)
 Patient with diagnosis of A Fib on Eliquis  for anticoagulation.    Procedure: Endoscopy Date of procedure: 08/17/24   CHA2DS2-VASc Score = 5  This indicates a 7.2% annual risk of stroke. The patient's score is based upon: CHF History: 1 HTN History: 1 Diabetes History: 1 Stroke History: 0 Vascular Disease History: 1 Age Score: 1 Gender Score: 0     CrCl 105 ml/min Platelet count 284K  Patient has not had an Afib/aflutter ablation within the last 3 months or DCCV within the last 30 days   Per office protocol, patient can hold Eliquis  for 2 days prior to procedure.    **This guidance is not considered finalized until pre-operative APP has relayed final recommendations.**

## 2024-06-12 NOTE — Telephone Encounter (Signed)
   Patient Name: [name]  DOB: 1956-01-11 MRN: [id number]  Primary Cardiologist: None  Clinical pharmacists have reviewed the patient's past medical history, labs, and current medications as part of preoperative protocol coverage. The following recommendations have been made:  Patient with diagnosis of A Fib on Eliquis  for anticoagulation.     Procedure: Endoscopy Date of procedure: 08/17/24     CHA2DS2-VASc Score = 5  This indicates a 7.2% annual risk of stroke. The patient's score is based upon: CHF History: 1 HTN History: 1 Diabetes History: 1 Stroke History: 0 Vascular Disease History: 1 Age Score: 1 Gender Score: 0       CrCl 105 ml/min Platelet count 284K   Patient has not had an Afib/aflutter ablation within the last 3 months or DCCV within the last 30 days     Per office protocol, patient can hold Eliquis  for 2 days prior to procedure.         I will route this recommendation to the requesting party via Epic fax function and remove from pre-op  pool.  Please call with questions.  Lamarr Satterfield, NP 06/12/2024, 1:43 PM

## 2024-06-12 NOTE — Telephone Encounter (Signed)
Patient returning call, Please advise.  Thank you  

## 2024-06-16 ENCOUNTER — Encounter

## 2024-06-16 DIAGNOSIS — I509 Heart failure, unspecified: Secondary | ICD-10-CM | POA: Diagnosis not present

## 2024-06-16 DIAGNOSIS — I480 Paroxysmal atrial fibrillation: Secondary | ICD-10-CM | POA: Diagnosis not present

## 2024-06-16 DIAGNOSIS — M19012 Primary osteoarthritis, left shoulder: Secondary | ICD-10-CM | POA: Diagnosis not present

## 2024-06-16 DIAGNOSIS — E1142 Type 2 diabetes mellitus with diabetic polyneuropathy: Secondary | ICD-10-CM | POA: Diagnosis not present

## 2024-06-16 DIAGNOSIS — I110 Hypertensive heart disease with heart failure: Secondary | ICD-10-CM | POA: Diagnosis not present

## 2024-06-16 DIAGNOSIS — D649 Anemia, unspecified: Secondary | ICD-10-CM | POA: Diagnosis not present

## 2024-06-16 DIAGNOSIS — J4489 Other specified chronic obstructive pulmonary disease: Secondary | ICD-10-CM | POA: Diagnosis not present

## 2024-06-16 DIAGNOSIS — M19011 Primary osteoarthritis, right shoulder: Secondary | ICD-10-CM | POA: Diagnosis not present

## 2024-06-16 DIAGNOSIS — F419 Anxiety disorder, unspecified: Secondary | ICD-10-CM | POA: Diagnosis not present

## 2024-06-17 ENCOUNTER — Encounter: Admitting: Podiatry

## 2024-06-18 ENCOUNTER — Telehealth: Payer: Self-pay

## 2024-06-18 ENCOUNTER — Other Ambulatory Visit: Payer: Self-pay | Admitting: Gastroenterology

## 2024-06-18 DIAGNOSIS — K580 Irritable bowel syndrome with diarrhea: Secondary | ICD-10-CM

## 2024-06-18 NOTE — Telephone Encounter (Signed)
clearance

## 2024-06-18 NOTE — Progress Notes (Signed)
 No ICM remote transmission received for 06/16/2024 and next ICM transmission scheduled for 06/29/2024.

## 2024-06-19 ENCOUNTER — Other Ambulatory Visit: Payer: Self-pay | Admitting: Gastroenterology

## 2024-06-19 DIAGNOSIS — D649 Anemia, unspecified: Secondary | ICD-10-CM | POA: Diagnosis not present

## 2024-06-19 DIAGNOSIS — J4489 Other specified chronic obstructive pulmonary disease: Secondary | ICD-10-CM | POA: Diagnosis not present

## 2024-06-19 DIAGNOSIS — F419 Anxiety disorder, unspecified: Secondary | ICD-10-CM | POA: Diagnosis not present

## 2024-06-19 DIAGNOSIS — M19011 Primary osteoarthritis, right shoulder: Secondary | ICD-10-CM | POA: Diagnosis not present

## 2024-06-19 DIAGNOSIS — I110 Hypertensive heart disease with heart failure: Secondary | ICD-10-CM | POA: Diagnosis not present

## 2024-06-19 DIAGNOSIS — M19012 Primary osteoarthritis, left shoulder: Secondary | ICD-10-CM | POA: Diagnosis not present

## 2024-06-19 DIAGNOSIS — I480 Paroxysmal atrial fibrillation: Secondary | ICD-10-CM | POA: Diagnosis not present

## 2024-06-19 DIAGNOSIS — E1142 Type 2 diabetes mellitus with diabetic polyneuropathy: Secondary | ICD-10-CM | POA: Diagnosis not present

## 2024-06-19 DIAGNOSIS — I509 Heart failure, unspecified: Secondary | ICD-10-CM | POA: Diagnosis not present

## 2024-06-19 DIAGNOSIS — K580 Irritable bowel syndrome with diarrhea: Secondary | ICD-10-CM

## 2024-06-19 MED ORDER — RIFAXIMIN 550 MG PO TABS: 550.0000 mg | ORAL_TABLET | Freq: Three times a day (TID) | ORAL | 0 refills | Status: AC

## 2024-06-19 NOTE — Progress Notes (Signed)
 RX for suspected Sibo

## 2024-06-22 ENCOUNTER — Ambulatory Visit: Payer: Self-pay

## 2024-06-22 ENCOUNTER — Telehealth: Payer: Self-pay | Admitting: Family Medicine

## 2024-06-22 ENCOUNTER — Telehealth: Payer: Self-pay | Admitting: Internal Medicine

## 2024-06-22 NOTE — Telephone Encounter (Signed)
 FYI Only or Action Required?: FYI only for provider.  Patient was last seen in primary care on 05/29/2024 by Norleen Lynwood ORN, MD.  Called Nurse Triage reporting Toe Pain.  Symptoms began several weeks ago.  Interventions attempted: Prescription medications: Oxycodone , Ibuprofen .  Symptoms are: gradually improving.  Triage Disposition: Home Care  Patient/caregiver understands and will follow disposition?:  Reason for Disposition  Toe pain    Toe amputation.  Answer Assessment - Initial Assessment Questions PT and nurse service comes once a week. Patient states its healing well, but slowly. Patient states he is out of Xanax  and needs a refill sent to pharmacy on file.   1. ONSET: When did the pain start?      3 weeks ago, amputation  2. LOCATION: Where is the pain located?   (e.g., around nail, entire toe, at foot joint)      2nd toe on right food   3. PAIN: How bad is the pain?    (Scale 1-10; or mild, moderate, severe)     6-7/10  4. APPEARANCE: What does the toe look like? (e.g., redness, swelling, bruising, pallor)     Denies infection  5. CAUSE: What do you think is causing the toe pain?     Toe amputation  6. OTHER SYMPTOMS: Do you have any other symptoms? (e.g., leg pain, rash, fever, numbness)     No  Protocols used: Toe Pain-A-AH  Copied from CRM [id number]. Topic: Clinical - Red Word Triage >> Jun 22, 2024  4:42 PM [place] J wrote: [facility] that prompted transfer to Nurse Triage: Patient is having foot pain every time pressure is applied. He does not have any mor xanax  and has been taking his tramadol  and oxycodone  to help.

## 2024-06-22 NOTE — Telephone Encounter (Signed)
 I received a call from [facility] stating that the patient was scheduled for home health therapy today but no one came to the door and there were 4 cars in the driveway.   She said that they will not be able to keep rescheduling if he is not able to do the therapy.

## 2024-06-22 NOTE — Telephone Encounter (Unsigned)
 Copied from CRM [id number]. Topic: Clinical - Medication Refill >> Jun 22, 2024  4:39 PM [place] J wrote: Medication: alprazolam  (XANAX ) 0.5 MG  Has the patient contacted their pharmacy? Yes (Agent: If no, request that the patient contact the pharmacy for the refill. If patient does not wish to contact the pharmacy document the reason why and proceed with request.) (Agent: If yes, when and what did the pharmacy advise?)  This is the patient's preferred pharmacy:  [id number] [address], [place] - [address] [place] [address] Phone: [phone] Fax: [phone]  Is this the correct pharmacy for this prescription? Yes If no, delete pharmacy and type the correct one.   Has the prescription been filled recently? No  Is the patient out of the medication? Yes  Has the patient been seen for an appointment in the last year OR does the patient have an upcoming appointment? Yes  Can we respond through MyChart? Yes  Agent: Please be advised that Rx refills may take up to 3 business days. We ask that you follow-up with your pharmacy.

## 2024-06-23 DIAGNOSIS — I110 Hypertensive heart disease with heart failure: Secondary | ICD-10-CM | POA: Diagnosis not present

## 2024-06-23 DIAGNOSIS — D649 Anemia, unspecified: Secondary | ICD-10-CM | POA: Diagnosis not present

## 2024-06-23 DIAGNOSIS — F419 Anxiety disorder, unspecified: Secondary | ICD-10-CM | POA: Diagnosis not present

## 2024-06-23 DIAGNOSIS — J4489 Other specified chronic obstructive pulmonary disease: Secondary | ICD-10-CM | POA: Diagnosis not present

## 2024-06-23 DIAGNOSIS — M19012 Primary osteoarthritis, left shoulder: Secondary | ICD-10-CM | POA: Diagnosis not present

## 2024-06-23 DIAGNOSIS — M19011 Primary osteoarthritis, right shoulder: Secondary | ICD-10-CM | POA: Diagnosis not present

## 2024-06-23 DIAGNOSIS — E1142 Type 2 diabetes mellitus with diabetic polyneuropathy: Secondary | ICD-10-CM | POA: Diagnosis not present

## 2024-06-23 DIAGNOSIS — I509 Heart failure, unspecified: Secondary | ICD-10-CM | POA: Diagnosis not present

## 2024-06-23 DIAGNOSIS — I480 Paroxysmal atrial fibrillation: Secondary | ICD-10-CM | POA: Diagnosis not present

## 2024-06-23 NOTE — Telephone Encounter (Signed)
 Copied from CRM [id number]. Topic: Clinical - Prescription Issue >> Jun 23, 2024  4:19 PM Fonda T wrote: Reason for CRM: Received call from patient, calling to check status of medication request of ALPRAZolam  (XANAX ) tablet 0.5 mg .  Per patient reports will be out of medication tonight, and [facility], as a request was sent to office.   Patient requesting a return call, can be reached at [phone].

## 2024-06-24 ENCOUNTER — Other Ambulatory Visit: Payer: Self-pay | Admitting: Internal Medicine

## 2024-06-24 ENCOUNTER — Encounter: Admitting: Podiatry

## 2024-06-24 DIAGNOSIS — I428 Other cardiomyopathies: Secondary | ICD-10-CM

## 2024-06-24 DIAGNOSIS — I255 Ischemic cardiomyopathy: Secondary | ICD-10-CM

## 2024-06-24 DIAGNOSIS — I4819 Other persistent atrial fibrillation: Secondary | ICD-10-CM

## 2024-06-25 DIAGNOSIS — I480 Paroxysmal atrial fibrillation: Secondary | ICD-10-CM | POA: Diagnosis not present

## 2024-06-25 DIAGNOSIS — D649 Anemia, unspecified: Secondary | ICD-10-CM | POA: Diagnosis not present

## 2024-06-25 DIAGNOSIS — I110 Hypertensive heart disease with heart failure: Secondary | ICD-10-CM | POA: Diagnosis not present

## 2024-06-25 DIAGNOSIS — M19011 Primary osteoarthritis, right shoulder: Secondary | ICD-10-CM | POA: Diagnosis not present

## 2024-06-25 DIAGNOSIS — I509 Heart failure, unspecified: Secondary | ICD-10-CM | POA: Diagnosis not present

## 2024-06-25 DIAGNOSIS — F419 Anxiety disorder, unspecified: Secondary | ICD-10-CM | POA: Diagnosis not present

## 2024-06-25 DIAGNOSIS — M19012 Primary osteoarthritis, left shoulder: Secondary | ICD-10-CM | POA: Diagnosis not present

## 2024-06-25 DIAGNOSIS — E1142 Type 2 diabetes mellitus with diabetic polyneuropathy: Secondary | ICD-10-CM | POA: Diagnosis not present

## 2024-06-26 ENCOUNTER — Ambulatory Visit: Payer: Self-pay

## 2024-06-26 ENCOUNTER — Other Ambulatory Visit: Payer: Self-pay | Admitting: Podiatry

## 2024-06-26 ENCOUNTER — Other Ambulatory Visit: Payer: Self-pay | Admitting: Internal Medicine

## 2024-06-26 ENCOUNTER — Telehealth: Payer: Self-pay | Admitting: Radiology

## 2024-06-26 DIAGNOSIS — R234 Changes in skin texture: Secondary | ICD-10-CM

## 2024-06-26 DIAGNOSIS — L97511 Non-pressure chronic ulcer of other part of right foot limited to breakdown of skin: Secondary | ICD-10-CM

## 2024-06-26 NOTE — Telephone Encounter (Signed)
     Message from [place] J sent at 06/26/2024  3:25 PM EDT  Patient has had diarrhea all week long. Everything he eats, will not stay in.

## 2024-06-26 NOTE — Telephone Encounter (Signed)
 Attempted to call patient but had to leave a voice message for call back.

## 2024-06-26 NOTE — Telephone Encounter (Signed)
 Ok to try otc immodium prn and let us  know if not working well, consider ROV for fever, pain, blood or other unusual symptoms.

## 2024-06-26 NOTE — Telephone Encounter (Signed)
 Copied from CRM #[id number]. Topic: General - Other >> Jun 26, 2024  3:21 PM [place] J wrote: Reason for CRM: Patient is returning a call back from the clinic. In regards to his alprazolam . I let him know that the refill has been approved and sent to the pharmacy.

## 2024-06-26 NOTE — Telephone Encounter (Signed)
 FYI Only or Action Required?: Action required by provider: update on patient condition.  Patient was last seen in primary care on 05/29/2024 by Norleen Lynwood ORN, MD.  Called Nurse Triage reporting Diarrhea.  Symptoms began a week ago.  Interventions attempted: Nothing.  Symptoms are: unchanged.  Triage Disposition: See HCP Within 4 Hours (Or PCP Triage)  Patient/caregiver understands and will follow disposition?: No, refuses disposition   Message from [place] J sent at 06/26/2024  3:25 PM EDT  Patient has had diarrhea all week long. Everything he eats, will not stay in.   Reason for Disposition  [1] Constant abdominal pain AND [2] present > 2 hours  Answer Assessment - Initial Assessment Questions 1. DIARRHEA SEVERITY: How bad is the diarrhea? How many more stools have you had in the past 24 hours than normal?      Has not had normal bm's all year.  Normally has BM at least every three days 2. ONSET: When did the diarrhea begin?      One week ago 3. STOOL DESCRIPTION:  How loose or watery is the diarrhea? What is the stool color? Is there any blood or mucous in the stool?     Liquid or watery 4. VOMITING: Are you also vomiting? If Yes, ask: How many times in the past 24 hours?     Denies vomiting 5. ABDOMEN PAIN: Are you having any abdomen pain? If Yes, ask: What does it feel like? (e.g., crampy, dull, intermittent, constant)      Tender to touch, lower left abdomen 6. ABDOMEN PAIN SEVERITY: If present, ask: How bad is the pain?  (e.g., Scale 1-10; mild, moderate, or severe)     7/10 with palpation 7. ORAL INTAKE: If vomiting, Have you been able to drink liquids? How much liquids have you had in the past 24 hours?     N/A 8. HYDRATION: Any signs of dehydration? (e.g., dry mouth [not just dry lips], too weak to stand, dizziness, new weight loss) When did you last urinate?     States that he has dizziness and dry mouth for years 9. EXPOSURE: Have you  traveled to a foreign country recently? Have you been exposed to anyone with diarrhea? Could you have eaten any food that was spoiled?     denies 10. ANTIBIOTIC USE: Are you taking antibiotics now or have you taken antibiotics in the past 2 months?       On antibiotics that were completed a couple of weeks ago 11. OTHER SYMPTOMS: Do you have any other symptoms? (e.g., fever, blood in stool)       Denies fever or blood in stools 12. PREGNANCY: Is there any chance you are pregnant? When was your last menstrual period?       N/a  Protocols used: [facility]

## 2024-06-26 NOTE — Telephone Encounter (Signed)
 Medication has since been refilled

## 2024-06-29 ENCOUNTER — Encounter: Payer: Self-pay | Admitting: Podiatry

## 2024-06-29 ENCOUNTER — Encounter

## 2024-06-29 ENCOUNTER — Ambulatory Visit (INDEPENDENT_AMBULATORY_CARE_PROVIDER_SITE_OTHER): Admitting: Podiatry

## 2024-06-29 VITALS — Ht 70.0 in | Wt 250.0 lb

## 2024-06-29 DIAGNOSIS — L97511 Non-pressure chronic ulcer of other part of right foot limited to breakdown of skin: Secondary | ICD-10-CM | POA: Diagnosis not present

## 2024-06-29 NOTE — Telephone Encounter (Signed)
 Please advise patient.

## 2024-06-29 NOTE — Progress Notes (Unsigned)
Chief Complaint  Patient presents with   Routine Post Op    POV # 2 DOS 06/01/24 RT 2ND TOE AMPUTATION    Subjective:  Patient presents today status post right second toe amputation.  DOS: 06/01/2024.  Doing well.  WBAT surgical shoe as instructed.  Past Medical History:  Diagnosis Date   AICD (automatic cardioverter/defibrillator) present    Anemia    supposed to be taking Vit B but doesn't   ANXIETY    takes Xanax  nightly   Arthritis    Asthma    Albuterol  prn and Advair  daily;also takes Prednisone  daily   Cardiomyopathy [facility])    a. EF 25% TEE July 2013; b. EF normalized 2015;  c. 03/2015 Echo: EF 40-45%, difrf HK, PASP 38 mmHg, Mild MR, sev LAE/RAE.   Chronic constipation    takes OTC stool softener   COPD (chronic obstructive pulmonary disease) (HCC)    one dr says COPD; one dr says emphysema (09/18/2017)   COVID-19 12/09/2019   DEPRESSION    takes Zoloft  and Doxepin  daily   Diverticulitis    DYSKINESIA, ESOPHAGUS    Essential hypertension        FIBROMYALGIA    GERD (gastroesophageal reflux disease)        Glaucoma    HYPERLIPIDEMIA    a. Intolerant to statins.   INSOMNIA    takes Ambien  nightly   Myocardial infarction [facility])    a. 2012 Myoview  notable for prior infarct;  b. 03/2015 Lexiscan  CL: EF 37%, diff HK, small area of inferior infarct from apex to base-->Med Rx.   O2 dependent    2.5L q hs & prn (09/18/2017)   Paroxysmal atrial fibrillation (HCC)    a. CHA2DS2VASc = 3--> takes Coumadin ;  b. 03/15/2015 Successful TEE/DCCV;  c. 03/2015 recurrent afib, Amio d/c'd in setting of hyperthyroidism.   Peripheral neuropathy    Pneumonia 12/2016   Rash and other nonspecific skin eruption 04/12/2009   no cause found saw dermatologists x 2 and allergist   SLEEP APNEA, OBSTRUCTIVE    a. doesn't use CPAP   Syncope    a. 03/2015 s/p MDT LINQ.   Type II diabetes mellitus (HCC)         Past Surgical History:  Procedure Laterality Date   ACNE CYST REMOVAL     2 on  back    AMPUTATION TOE Right 06/01/2024   Procedure: AMPUTATION, TOE;  Surgeon: Janit Thresa HERO, DPM;  Location: [facility] OR;  Service: Orthopedics/Podiatry;  Laterality: Right;  RIGHT 2ND TOE IV SEDATION   AV NODE ABLATION N/A 10/25/2017   Procedure: AV NODE ABLATION;  Surgeon: Fernande Elspeth BROCKS, MD;  Location: [facility] INVASIVE CV LAB;  Service: Cardiovascular;  Laterality: N/A;   BIV ICD INSERTION CRT-D N/A 09/18/2017   Procedure: BIV ICD INSERTION CRT-D;  Surgeon: Fernande Elspeth BROCKS, MD;  Location: [facility] INVASIVE CV LAB;  Service: Cardiovascular;  Laterality: N/A;   CARDIAC CATHETERIZATION N/A 03/21/2016   Procedure: Right/Left Heart Cath and Coronary Angiography;  Surgeon: Ezra GORMAN Shuck, MD;  Location: [name] INVASIVE CV LAB;  Service: Cardiovascular;  Laterality: N/A;   CARDIOVERSION  04/18/2012   Procedure: CARDIOVERSION;  Surgeon: Vina LULLA Gull, MD;  Location: [facility] OR;  Service: Cardiovascular;  Laterality: N/A;   CARDIOVERSION  04/25/2012   Procedure: CARDIOVERSION;  Surgeon: Aleene JINNY Passe, MD;  Location: [facility] ENDOSCOPY;  Service: Cardiovascular;  Laterality: N/A;   CARDIOVERSION  04/25/2012   Procedure: CARDIOVERSION;  Surgeon: Vina LULLA Gull, MD;  Location[facility] OR;  Service: Cardiovascular;  Laterality: N/A;   CARDIOVERSION  05/09/2012   Procedure: CARDIOVERSION;  Surgeon: Ozell Fell, MD;  Location: [facility] OR;  Service: Cardiovascular;  Laterality: N/A;  changed from crenshaw to cooper by trish/leone-endo   CARDIOVERSION N/A 03/15/2015   Procedure: CARDIOVERSION;  Surgeon: Aleene JINNY Passe, MD;  Location: [facility] ENDOSCOPY;  Service: Cardiovascular;  Laterality: N/A;   COLONOSCOPY     COLONOSCOPY WITH PROPOFOL  N/A 10/21/2014   Procedure: COLONOSCOPY WITH PROPOFOL ;  Surgeon: Gwendlyn ONEIDA Buddy, MD;  Location: [facility] ENDOSCOPY;  Service: Endoscopy;  Laterality: N/A;   EP IMPLANTABLE DEVICE N/A 04/06/2015   Procedure: Loop Recorder Insertion;  Surgeon: Danelle LELON Birmingham, MD;  Location: [facility] INVASIVE CV LAB;  Service: Cardiovascular;  Laterality:  N/A;   ESOPHAGOGASTRODUODENOSCOPY     JOINT REPLACEMENT     LOOP RECORDER REMOVAL N/A 09/18/2017   Procedure: LOOP RECORDER REMOVAL;  Surgeon: Fernande Elspeth BROCKS, MD;  Location: [facility] INVASIVE CV LAB;  Service: Cardiovascular;  Laterality: N/A;   RIGHT/LEFT HEART CATH AND CORONARY ANGIOGRAPHY N/A 01/28/2017   Procedure: Right/Left Heart Cath and Coronary Angiography;  Surgeon: Ezra GORMAN Shuck, MD;  Location: [facility] INVASIVE CV LAB;  Service: Cardiovascular;  Laterality: N/A;   TEE WITHOUT CARDIOVERSION  04/25/2012   Procedure: TRANSESOPHAGEAL ECHOCARDIOGRAM (TEE);  Surgeon: Aleene JINNY Passe, MD;  Location: [facility] ENDOSCOPY;  Service: Cardiovascular;  Laterality: N/A;   TEE WITHOUT CARDIOVERSION N/A 03/15/2015   Procedure: TRANSESOPHAGEAL ECHOCARDIOGRAM (TEE);  Surgeon: Aleene JINNY Passe, MD;  Location: [facility] ENDOSCOPY;  Service: Cardiovascular;  Laterality: N/A;   TONSILLECTOMY AND ADENOIDECTOMY     TOTAL KNEE ARTHROPLASTY Right 06/15/2014   Procedure: TOTAL KNEE ARTHROPLASTY;  Surgeon: Evalene JONETTA Chancy, MD;  Location: [facility] OR;  Service: Orthopedics;  Laterality: Right;   TOTAL KNEE ARTHROPLASTY Left 10/17/2021   Procedure: Left TOTAL KNEE ARTHROPLASTY;  Surgeon: Vernetta Lonni GRADE, MD;  Location: [facility] OR;  Service: Orthopedics;  Laterality: Left;    Allergies  Allergen Reactions   Amiodarone  Other (See Comments)    Caused hyperthyroidism    Statins Other (See Comments)    Myalgias    Sulfa  Antibiotics Other (See Comments)    Mouth ulcers   Wound Dressing Adhesive Other (See Comments)    Skin Tears Use Paper Tape Only    Objective/Physical Exam Neurovascular status intact.  Incision well coapted with sutures intact. No sign of infectious process noted. No dehiscence. No active bleeding noted.  Negative for any appreciable edema.  Overall the toe amputation site is healing nicely  Radiographic Exam RT foot 06/10/2024:  Interval amputation of the second digit at the level of the MTP with routine  healing  Assessment: 1. s/p right second toe amputation. DOS: 06/10/2024   Plan of Care:  -Patient was evaluated. X-rays reviewed - Okay to wash and shower and get the foot wet -Recommend Silvadene  cream over the incision site daily -Okay to transfer out of the postop shoe into good supportive slides -Return to clinic 2 weeks suture removal   Thresa EMERSON Sar, DPM [facility]  Dr. Thresa EMERSON Sar, DPM    [address] [place] [address]                Office [phone]  Fax [phone]  [phone]      

## 2024-06-30 ENCOUNTER — Telehealth: Payer: Self-pay

## 2024-06-30 NOTE — Telephone Encounter (Signed)
 Attempted call to patient.  Left message to send manual remote transmission for review. Left call back number for assistance.

## 2024-07-01 ENCOUNTER — Encounter: Admitting: Podiatry

## 2024-07-01 NOTE — Progress Notes (Signed)
 No ICM remote transmission received for 06/29/2024 and next ICM transmission scheduled for 07/13/2024.

## 2024-07-02 DIAGNOSIS — I480 Paroxysmal atrial fibrillation: Secondary | ICD-10-CM | POA: Diagnosis not present

## 2024-07-02 DIAGNOSIS — J4489 Other specified chronic obstructive pulmonary disease: Secondary | ICD-10-CM | POA: Diagnosis not present

## 2024-07-02 DIAGNOSIS — E1142 Type 2 diabetes mellitus with diabetic polyneuropathy: Secondary | ICD-10-CM | POA: Diagnosis not present

## 2024-07-02 DIAGNOSIS — F419 Anxiety disorder, unspecified: Secondary | ICD-10-CM | POA: Diagnosis not present

## 2024-07-02 DIAGNOSIS — D649 Anemia, unspecified: Secondary | ICD-10-CM | POA: Diagnosis not present

## 2024-07-02 DIAGNOSIS — M19012 Primary osteoarthritis, left shoulder: Secondary | ICD-10-CM | POA: Diagnosis not present

## 2024-07-02 DIAGNOSIS — M19011 Primary osteoarthritis, right shoulder: Secondary | ICD-10-CM | POA: Diagnosis not present

## 2024-07-02 DIAGNOSIS — I110 Hypertensive heart disease with heart failure: Secondary | ICD-10-CM | POA: Diagnosis not present

## 2024-07-02 NOTE — Telephone Encounter (Signed)
 Called and spoke with patient. He noted he reached out to his GI surgeon who performed the endoscopy and colonoscopy and they were able to provide a medication to him. He noted diarrhea has stopped and he has one more day left of this med. He will be switching back to the linzess  starting tomorrow

## 2024-07-07 ENCOUNTER — Ambulatory Visit: Payer: Self-pay

## 2024-07-07 NOTE — Telephone Encounter (Signed)
 Patient having diarrhea   Patient called-no answer. Unable to leave voicemail as mailbox is full.

## 2024-07-07 NOTE — Telephone Encounter (Signed)
 FYI Only or Action Required?: FYI only for provider.  Patient was last seen in primary care on 05/29/2024 by Norleen Lynwood ORN, MD.  Called Nurse Triage reporting Diarrhea.  Symptoms began several weeks ago. Pt states it happens for a week then nothing for a week, states week on then off.  Interventions attempted: OTC medications: anti-diarrheal helps eventually.  Symptoms are: unchanged.  Triage Disposition: See Physician Within 24 Hours  Patient/caregiver understands and will follow disposition?: No  Reason for Disposition  [1] MODERATE diarrhea (e.g., 4-6 times / day more than normal) AND [2] present > 48 hours (2 days)  Answer Assessment - Initial Assessment Questions 1. DIARRHEA SEVERITY: How bad is the diarrhea? How many more stools have you had in the past 24 hours than normal?      Every time I eat, 3 times since yesterday 2. ONSET: When did the diarrhea begin?      About week 3. STOOL DESCRIPTION:  How loose or watery is the diarrhea? What is the stool color? Is there any blood or mucous in the stool?     watery 4. VOMITING: Are you also vomiting? If Yes, ask: How many times in the past 24 hours?      denies 5. ABDOMEN PAIN: Are you having any abdomen pain? If Yes, ask: What does it feel like? (e.g., crampy, dull, intermittent, constant)      Only on palpation, LLQ 6. ABDOMEN PAIN SEVERITY: If present, ask: How bad is the pain?  (e.g., Scale 1-10; mild, moderate, or severe)     7 7. ORAL INTAKE: If vomiting, Have you been able to drink liquids? How much liquids have you had in the past 24 hours?     yesterday 8. HYDRATION: Any signs of dehydration? (e.g., dry mouth [not just dry lips], too weak to stand, dizziness, new weight loss) When did you last urinate?     WNL 9. EXPOSURE: Have you traveled to a foreign country recently? Have you been exposed to anyone with diarrhea? Could you have eaten any food that was spoiled?     denies 10.  ANTIBIOTIC USE: Are you taking antibiotics now or have you taken antibiotics in the past 2 months?       denies 11. OTHER SYMPTOMS: Do you have any other symptoms? (e.g., fever, blood in stool)       Denies  Pt offered appt tomorrow with PCP, pt refused, states that the lady that normally gives him rides is sick with same diarrhea. Pt requesting to come in next week.  Protocols used: [facility]

## 2024-07-08 DIAGNOSIS — M19011 Primary osteoarthritis, right shoulder: Secondary | ICD-10-CM | POA: Diagnosis not present

## 2024-07-08 DIAGNOSIS — M19012 Primary osteoarthritis, left shoulder: Secondary | ICD-10-CM | POA: Diagnosis not present

## 2024-07-08 DIAGNOSIS — I110 Hypertensive heart disease with heart failure: Secondary | ICD-10-CM | POA: Diagnosis not present

## 2024-07-08 DIAGNOSIS — J4489 Other specified chronic obstructive pulmonary disease: Secondary | ICD-10-CM | POA: Diagnosis not present

## 2024-07-08 DIAGNOSIS — F419 Anxiety disorder, unspecified: Secondary | ICD-10-CM | POA: Diagnosis not present

## 2024-07-08 DIAGNOSIS — I480 Paroxysmal atrial fibrillation: Secondary | ICD-10-CM | POA: Diagnosis not present

## 2024-07-08 DIAGNOSIS — E1142 Type 2 diabetes mellitus with diabetic polyneuropathy: Secondary | ICD-10-CM | POA: Diagnosis not present

## 2024-07-08 DIAGNOSIS — I509 Heart failure, unspecified: Secondary | ICD-10-CM | POA: Diagnosis not present

## 2024-07-08 DIAGNOSIS — D649 Anemia, unspecified: Secondary | ICD-10-CM | POA: Diagnosis not present

## 2024-07-09 DIAGNOSIS — I509 Heart failure, unspecified: Secondary | ICD-10-CM | POA: Diagnosis not present

## 2024-07-09 DIAGNOSIS — E1142 Type 2 diabetes mellitus with diabetic polyneuropathy: Secondary | ICD-10-CM | POA: Diagnosis not present

## 2024-07-09 DIAGNOSIS — I480 Paroxysmal atrial fibrillation: Secondary | ICD-10-CM | POA: Diagnosis not present

## 2024-07-09 DIAGNOSIS — M19012 Primary osteoarthritis, left shoulder: Secondary | ICD-10-CM | POA: Diagnosis not present

## 2024-07-09 DIAGNOSIS — F419 Anxiety disorder, unspecified: Secondary | ICD-10-CM | POA: Diagnosis not present

## 2024-07-09 DIAGNOSIS — D649 Anemia, unspecified: Secondary | ICD-10-CM | POA: Diagnosis not present

## 2024-07-09 DIAGNOSIS — M19011 Primary osteoarthritis, right shoulder: Secondary | ICD-10-CM | POA: Diagnosis not present

## 2024-07-09 DIAGNOSIS — I110 Hypertensive heart disease with heart failure: Secondary | ICD-10-CM | POA: Diagnosis not present

## 2024-07-11 ENCOUNTER — Other Ambulatory Visit: Payer: Self-pay | Admitting: Internal Medicine

## 2024-07-12 ENCOUNTER — Other Ambulatory Visit: Payer: Self-pay | Admitting: Internal Medicine

## 2024-07-13 ENCOUNTER — Encounter

## 2024-07-14 ENCOUNTER — Ambulatory Visit: Admitting: Internal Medicine

## 2024-07-15 ENCOUNTER — Ambulatory Visit

## 2024-07-15 ENCOUNTER — Telehealth (INDEPENDENT_AMBULATORY_CARE_PROVIDER_SITE_OTHER): Admitting: Internal Medicine

## 2024-07-15 ENCOUNTER — Encounter: Payer: Self-pay | Admitting: Internal Medicine

## 2024-07-15 DIAGNOSIS — L308 Other specified dermatitis: Secondary | ICD-10-CM | POA: Diagnosis not present

## 2024-07-15 DIAGNOSIS — R197 Diarrhea, unspecified: Secondary | ICD-10-CM

## 2024-07-15 DIAGNOSIS — E559 Vitamin D deficiency, unspecified: Secondary | ICD-10-CM

## 2024-07-15 MED ORDER — PREDNISONE 10 MG PO TABS: ORAL_TABLET | ORAL | 0 refills | Status: DC

## 2024-07-15 MED ORDER — TRAMADOL HCL 50 MG PO TABS: 50.0000 mg | ORAL_TABLET | Freq: Three times a day (TID) | ORAL | 5 refills | Status: DC

## 2024-07-15 MED ORDER — DOXYCYCLINE HYCLATE 100 MG PO TABS: 100.0000 mg | ORAL_TABLET | Freq: Two times a day (BID) | ORAL | 0 refills | Status: DC

## 2024-07-15 MED ORDER — LINACLOTIDE 72 MCG PO CAPS: 72.0000 ug | ORAL_CAPSULE | Freq: Every day | ORAL | 3 refills | Status: DC

## 2024-07-15 NOTE — Progress Notes (Signed)
 No ICM remote transmission received for 07/13/2024 and next ICM transmission scheduled for 08/05/2024.

## 2024-07-15 NOTE — Patient Instructions (Addendum)
 Please take all new medication as prescribed  -  the antibiotic, prednisone   Ok to reduce the linzess  72 mcg per day  Please continue all other medications as before, and refill done for tramadol    Please have the pharmacy call with any other refills you may need.  Please keep your appointments with your specialists as you may have planned

## 2024-07-15 NOTE — Assessment & Plan Note (Signed)
 Last vitamin D  Lab Results  Component Value Date   VD25OH 29.38 (L) 01/07/2024   Low, to start  oral replacement

## 2024-07-15 NOTE — Assessment & Plan Note (Addendum)
 Mild to mod, for prednisone  taper,  also doxycycline  100 bid for possible assoc infection, to f/u any worsening symptoms or concerns

## 2024-07-15 NOTE — Progress Notes (Signed)
 Patient ID: [name], male   DOB: 1956-09-30, 68 y.o.   MRN: [id number]  Virtual Visit via Video Note  I connected with [name] on 07/15/24 at  3:40 PM EDT by a video enabled telemedicine application and verified that I am speaking with the correct person using two identifiers.  Location of all particpants today Patient: at home with susan apple, also a patient Provider: at office   I discussed the limitations of evaluation and management by telemedicine and the availability of in person appointments. The patient expressed understanding and agreed to proceed.  History of Present Illness: Here with mild to mod recurring worsening loose stools and diarrhea on the current dose of linzess .  Denies worsening reflux, abd pain, dysphagia, n/v or blood. Pt denies chest pain, increased sob or doe, wheezing, orthopnea, PND, increased LE swelling, palpitations, dizziness or syncope.   Pt denies polydipsia, polyuria, or new focal neuro s/s.    Pt denies fever, wt loss, night sweats, loss of appetite, or other constitutional symptoms.  Does have recurrence of the red blistering areas of the right leg that required antibiotic at previous visit, also with hx of spongiotic dermatitis improved with prednisone .  Has chronic pain to lower back and knees, asks for tramadol  refill Past Medical History:  Diagnosis Date   AICD (automatic cardioverter/defibrillator) present    Anemia    supposed to be taking Vit B but doesn't   ANXIETY    takes Xanax  nightly   Arthritis    Asthma    Albuterol  prn and Advair  daily;also takes Prednisone  daily   Cardiomyopathy [facility])    a. EF 25% TEE July 2013; b. EF normalized 2015;  c. 03/2015 Echo: EF 40-45%, difrf HK, PASP 38 mmHg, Mild MR, sev LAE/RAE.   Chronic constipation    takes OTC stool softener   COPD (chronic obstructive pulmonary disease) (HCC)    one dr says COPD; one dr says emphysema (09/18/2017)   COVID-19 12/09/2019   DEPRESSION    takes Zoloft  and  Doxepin  daily   Diverticulitis    DYSKINESIA, ESOPHAGUS    Essential hypertension        FIBROMYALGIA    GERD (gastroesophageal reflux disease)        Glaucoma    HYPERLIPIDEMIA    a. Intolerant to statins.   INSOMNIA    takes Ambien  nightly   Myocardial infarction [facility])    a. 2012 Myoview  notable for prior infarct;  b. 03/2015 Lexiscan  CL: EF 37%, diff HK, small area of inferior infarct from apex to base-->Med Rx.   O2 dependent    2.5L q hs & prn (09/18/2017)   Paroxysmal atrial fibrillation (HCC)    a. CHA2DS2VASc = 3--> takes Coumadin ;  b. 03/15/2015 Successful TEE/DCCV;  c. 03/2015 recurrent afib, Amio d/c'd in setting of hyperthyroidism.   Peripheral neuropathy    Pneumonia 12/2016   Rash and other nonspecific skin eruption 04/12/2009   no cause found saw dermatologists x 2 and allergist   SLEEP APNEA, OBSTRUCTIVE    a. doesn't use CPAP   Syncope    a. 03/2015 s/p MDT LINQ.   Type II diabetes mellitus (HCC)        Past Surgical History:  Procedure Laterality Date   ACNE CYST REMOVAL     2 on back    AMPUTATION TOE Right 06/01/2024   Procedure: AMPUTATION, TOE;  Surgeon: Janit Thresa HERO, DPM;  Location: [facility] OR;  Service: Orthopedics/Podiatry;  Laterality: Right;  RIGHT 2ND TOE IV SEDATION   AV NODE ABLATION N/A 10/25/2017   Procedure: AV NODE ABLATION;  Surgeon: Fernande Elspeth BROCKS, MD;  Location: [facility] INVASIVE CV LAB;  Service: Cardiovascular;  Laterality: N/A;   BIV ICD INSERTION CRT-D N/A 09/18/2017   Procedure: BIV ICD INSERTION CRT-D;  Surgeon: Fernande Elspeth BROCKS, MD;  Location: [facility] INVASIVE CV LAB;  Service: Cardiovascular;  Laterality: N/A;   CARDIAC CATHETERIZATION N/A 03/21/2016   Procedure: Right/Left Heart Cath and Coronary Angiography;  Surgeon: Ezra GORMAN Shuck, MD;  Location: [facility] INVASIVE CV LAB;  Service: Cardiovascular;  Laterality: N/A;   CARDIOVERSION  04/18/2012   Procedure: CARDIOVERSION;  Surgeon: Vina LULLA Gull, MD;  Location: [facility] OR;  Service: Cardiovascular;  Laterality:  N/A;   CARDIOVERSION  04/25/2012   Procedure: CARDIOVERSION;  Surgeon: Aleene JINNY Passe, MD;  Location: [facility] ENDOSCOPY;  Service: Cardiovascular;  Laterality: N/A;   CARDIOVERSION  04/25/2012   Procedure: CARDIOVERSION;  Surgeon: Vina LULLA Gull, MD;  Location: [facility] OR;  Service: Cardiovascular;  Laterality: N/A;   CARDIOVERSION  05/09/2012   Procedure: CARDIOVERSION;  Surgeon: Ozell Fell, MD;  Location: [facility] OR;  Service: Cardiovascular;  Laterality: N/A;  changed from crenshaw to cooper by trish/leone-endo   CARDIOVERSION N/A 03/15/2015   Procedure: CARDIOVERSION;  Surgeon: Aleene JINNY Passe, MD;  Location: [facility] ENDOSCOPY;  Service: Cardiovascular;  Laterality: N/A;   COLONOSCOPY     COLONOSCOPY WITH PROPOFOL  N/A 10/21/2014   Procedure: COLONOSCOPY WITH PROPOFOL ;  Surgeon: Gwendlyn ONEIDA Buddy, MD;  Location: [facility] ENDOSCOPY;  Service: Endoscopy;  Laterality: N/A;   EP IMPLANTABLE DEVICE N/A 04/06/2015   Procedure: Loop Recorder Insertion;  Surgeon: Danelle LELON Birmingham, MD;  Location: [facility] INVASIVE CV LAB;  Service: Cardiovascular;  Laterality: N/A;   ESOPHAGOGASTRODUODENOSCOPY     JOINT REPLACEMENT     LOOP RECORDER REMOVAL N/A 09/18/2017   Procedure: LOOP RECORDER REMOVAL;  Surgeon: Fernande Elspeth BROCKS, MD;  Location: [facility] INVASIVE CV LAB;  Service: Cardiovascular;  Laterality: N/A;   RIGHT/LEFT HEART CATH AND CORONARY ANGIOGRAPHY N/A 01/28/2017   Procedure: Right/Left Heart Cath and Coronary Angiography;  Surgeon: Ezra GORMAN Shuck, MD;  Location: [facility] INVASIVE CV LAB;  Service: Cardiovascular;  Laterality: N/A;   TEE WITHOUT CARDIOVERSION  04/25/2012   Procedure: TRANSESOPHAGEAL ECHOCARDIOGRAM (TEE);  Surgeon: Aleene JINNY Passe, MD;  Location: [facility] ENDOSCOPY;  Service: Cardiovascular;  Laterality: N/A;   TEE WITHOUT CARDIOVERSION N/A 03/15/2015   Procedure: TRANSESOPHAGEAL ECHOCARDIOGRAM (TEE);  Surgeon: Aleene JINNY Passe, MD;  Location: [facility] ENDOSCOPY;  Service: Cardiovascular;  Laterality: N/A;   TONSILLECTOMY AND ADENOIDECTOMY      TOTAL KNEE ARTHROPLASTY Right 06/15/2014   Procedure: TOTAL KNEE ARTHROPLASTY;  Surgeon: Evalene JONETTA Chancy, MD;  Location: [facility] OR;  Service: Orthopedics;  Laterality: Right;   TOTAL KNEE ARTHROPLASTY Left 10/17/2021   Procedure: Left TOTAL KNEE ARTHROPLASTY;  Surgeon: Vernetta Lonni GRADE, MD;  Location: [facility] OR;  Service: Orthopedics;  Laterality: Left;    reports that he quit smoking about 16 years ago. His smoking use included cigarettes. He started smoking about 46 years ago. He has a 60 pack-year smoking history. He has never used smokeless tobacco. He reports that he does not drink alcohol and does not use drugs. family history includes Allergies in his mother; Asthma in his maternal grandmother and mother; COPD in his mother; Colon polyps in his mother; Hypothyroidism in his mother. Allergies  Allergen Reactions   Amiodarone  Other (See Comments)    Caused hyperthyroidism    Statins  Other (See Comments)    Myalgias    Sulfa  Antibiotics Other (See Comments)    Mouth ulcers   Wound Dressing Adhesive Other (See Comments)    Skin Tears Use Paper Tape Only   Current Outpatient Medications on File Prior to Visit  Medication Sig Dispense Refill   acetaminophen  (TYLENOL ) 325 MG tablet Take 2 tablets (650 mg total) by mouth every 6 (six) hours as needed for fever or mild pain (pain score 1-3).     albuterol  (VENTOLIN  HFA) 108 (90 Base) MCG/ACT inhaler INHALE 1-2 PUFFS BY MOUTH EVERY 6 HOURS AS NEEDED FOR WHEEZE OR SHORTNESS OF BREATH 8.5 each 5   ALPRAZolam  (XANAX ) 0.5 MG tablet TAKE 2 TABLETS (1 MG TOTAL) BY MOUTH EVERY DAY AT BEDTIME 60 tablet 1   ammonium lactate  (AMLACTIN) 12 % cream Apply 1 Application topically as needed for dry skin. 385 g 3   apixaban  (ELIQUIS ) 5 MG TABS tablet Take 1 tablet (5 mg total) by mouth 2 (two) times daily. (Patient taking differently: Take 5 mg by mouth daily.) 60 tablet 11   ascorbic acid (VITAMIN C) 500 MG tablet Take 500 mg by mouth daily.     augmented  betamethasone  dipropionate (DIPROLENE -AF) 0.05 % cream Apply 1 application  topically daily as needed (to affected areas- for itching).     Cholecalciferol  (VITAMIN D3) 50 MCG (2000 UT) TABS Take 2,000 Units by mouth daily.     Cyanocobalamin  (VITAMIN B-12) 2500 MCG SUBL Place 2,500 mcg under the tongue daily.     doxepin  (SINEQUAN ) 10 MG capsule Take 2 capsules (20 mg total) by mouth at bedtime. 180 capsule 1   DULoxetine  (CYMBALTA ) 60 MG capsule TAKE 1 CAPSULE BY MOUTH EVERY DAY 90 capsule 0   Dupilumab (DUPIXENT) 300 MG/2ML SOPN Inject 300 mg into the skin every 14 (fourteen) days.     eplerenone  (INSPRA ) 25 MG tablet Take 25 mg by mouth daily.     esomeprazole  (NEXIUM ) 40 MG capsule Take 1 capsule (40 mg total) by mouth daily at 12 noon. 30 capsule 2   Evolocumab  (REPATHA  SURECLICK) 140 MG/ML SOAJ INJECT 140 MG INTO THE SKIN EVERY 14 DAYS 6 mL 3   fluticasone  (FLONASE ) 50 MCG/ACT nasal spray Place 2 sprays into both nostrils daily as needed for allergies or rhinitis. 48 mL 1   gabapentin  (NEURONTIN ) 100 MG capsule Take 1 capsule (100 mg total) by mouth at bedtime.     ipratropium-albuterol  (DUONEB) 0.5-2.5 (3) MG/3ML SOLN TAKE 3 ML BY NEBULIZATION EVERY 4 HOURS AS NEEDED (WHEEZING). 360 mL 0   lidocaine  (LIDODERM ) 5 % Place 1 patch onto the skin daily. Remove & Discard patch within 12 hours or as directed by MD (Patient taking differently: Place 1 patch onto the skin daily as needed (Pain).) 30 patch 2   losartan  (COZAAR ) 25 MG tablet Take 25 mg by mouth daily.     magnesium  hydroxide (MILK OF MAGNESIA) 400 MG/5ML suspension Take 5 mLs by mouth daily as needed for mild constipation. 355 mL 0   meclizine  (ANTIVERT ) 25 MG tablet Take 1 tablet (25 mg total) by mouth 3 (three) times daily as needed for dizziness. 30 tablet 0   Multiple Vitamins-Minerals (SENIOR VITES PO) Take 1 tablet by mouth daily with breakfast.     omeprazole  (PRILOSEC) 20 MG capsule TAKE 1 CAPSULE BY MOUTH TWICE A DAY 180  capsule 1   ondansetron  (ZOFRAN -ODT) 4 MG disintegrating tablet TAKE 1 TABLET BY MOUTH EVERY 8 HOURS AS  NEEDED 30 tablet 1   oxyCODONE  (ROXICODONE ) 5 MG immediate release tablet Take 1 tablet (5 mg total) by mouth every 6 (six) hours as needed for severe pain (pain score 7-10). 30 tablet 0   oxyCODONE -acetaminophen  (PERCOCET) 5-325 MG tablet Take 1 tablet by mouth every 6 (six) hours as needed for severe pain (pain score 7-10). 20 tablet 0   potassium chloride  SA (KLOR-CON  M) 20 MEQ tablet Take 60 mEq by mouth daily.     predniSONE  (DELTASONE ) 10 MG tablet TAKE 1 TABLET (10 MG TOTAL) BY MOUTH DAILY WITH BREAKFAST. 90 tablet 0   tamsulosin  (FLOMAX ) 0.4 MG CAPS capsule Take 1 capsule (0.4 mg total) by mouth daily. 30 capsule 0   Tiotropium Bromide  Monohydrate (SPIRIVA  RESPIMAT) 2.5 MCG/ACT AERS Take 2 puffs by nebulization daily. 4 g 3   tirzepatide  (MOUNJARO ) 7.5 MG/0.5ML Pen Inject 7.5 mg into the skin once a week. 6 mL 3   tiZANidine  (ZANAFLEX ) 4 MG tablet TAKE 1 TABLET BY MOUTH 3 TIMES DAILY. 270 tablet 2   torsemide  (DEMADEX ) 20 MG tablet Take 4 tablets (80 mg total) by mouth 2 (two) times daily. 720 tablet 2   traZODone  (DESYREL ) 100 MG tablet TAKE 2 TABLETS BY MOUTH AT BEDTIME AS NEEDED (Patient taking differently: Take 200 mg by mouth at bedtime.) 180 tablet 1   TUMS 500 MG chewable tablet Chew 1 tablet by mouth 2 (two) times daily as needed for indigestion or heartburn.     valACYclovir  (VALTREX ) 1000 MG tablet Take 1,000 mg by mouth 3 (three) times daily as needed (Outbreaks).     venlafaxine  XR (EFFEXOR -XR) 150 MG 24 hr capsule TAKE 1 CAPSULE BY MOUTH DAILY WITH BREAKFAST. 90 capsule 3   WIXELA INHUB 250-50 MCG/ACT AEPB INHALE 1 PUFF INTO THE LUNGS IN THE MORNING AND AT BEDTIME. 60 each 8   zolpidem  (AMBIEN ) 5 MG tablet Take 5 mg by mouth at bedtime.     No current facility-administered medications on file prior to visit.    Observations/Objective: Alert, NAD, appropriate mood and  affect, resps normal, cn 2-12 intact, moves all 4s, no visible rash or swelling Lab Results  Component Value Date   WBC 14.8 (H) 05/20/2024   HGB 11.0 (L) 05/20/2024   HCT 35.1 (L) 05/20/2024   PLT 284 05/20/2024   GLUCOSE 87 05/28/2024   CHOL 130 01/07/2024   TRIG 139.0 01/07/2024   HDL 52.40 01/07/2024   LDLDIRECT 72.0 02/28/2021   LDLCALC 49 01/07/2024   ALT 18 05/04/2024   AST 27 05/04/2024   NA 139 05/28/2024   K 4.3 05/28/2024   CL 101 05/28/2024   CREATININE 1.07 05/28/2024   BUN 26 (H) 05/28/2024   CO2 30 05/28/2024   TSH 2.02 01/07/2024   PSA 3.46 01/07/2024   INR 1.3 (H) 05/04/2024   HGBA1C 6.0 01/07/2024   MICROALBUR <0.7 01/07/2024   Assessment and Plan: See notes  Follow Up Instructions: See notes   I discussed the assessment and treatment plan with the patient. The patient was provided an opportunity to ask questions and all were answered. The patient agreed with the plan and demonstrated an understanding of the instructions.   The patient was advised to call back or seek an in-person evaluation if the symptoms worsen or if the condition fails to improve as anticipated.   Lynwood Rush, MD '

## 2024-07-15 NOTE — Assessment & Plan Note (Signed)
 Unsual for him, afeb nontoxic, seems likely linzess  145 mcg is cause, so now for decreased linzess  72 mcg per day

## 2024-07-16 ENCOUNTER — Telehealth (HOSPITAL_COMMUNITY): Payer: Self-pay

## 2024-07-16 NOTE — Telephone Encounter (Signed)
 Patient left message on triage vm, wanting to know if it was time to get his battery replaced in his device, tried calling patient back to advise him to contact Dr. Retia office, no answer, unable to leave patient a voicemail due to it being full.

## 2024-07-17 NOTE — Progress Notes (Signed)
 Remote ICD Transmission

## 2024-07-18 DIAGNOSIS — E1142 Type 2 diabetes mellitus with diabetic polyneuropathy: Secondary | ICD-10-CM | POA: Diagnosis not present

## 2024-07-22 ENCOUNTER — Encounter

## 2024-07-23 NOTE — Progress Notes (Unsigned)
 [name] [address] [place] [address] Phone: [phone] Subjective:    I'm seeing this patient by the request  of:  Norleen Lynwood ORN, MD  CC: Bilateral shoulder pain  YEP:Dlagzrupcz  05/19/2024 Bilateral injections given, discussed with patient that icing regimen and home exercises, increase activity slowly.  Chronic problem again and will continue to do injections if are helpful.  Follow-up again 12 weeks.     Bilateral severe arthritic changes noted of the shoulders.  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  Discussed icing regimen and home exercises, which activities to do in which ones to avoid.  Can repeat injections every 10 to 12 weeks otherwise.     Updated 07/28/2024 [name] [name] is a 68 y.o. male coming in with complaint of B shoulder pain. Patient said that his shoulders are bothering him. Would like injections today.        Past Medical History:  Diagnosis Date   AICD (automatic cardioverter/defibrillator) present    Anemia    supposed to be taking Vit B but doesn't   ANXIETY    takes Xanax  nightly   Arthritis    Asthma    Albuterol  prn and Advair  daily;also takes Prednisone  daily   Cardiomyopathy [facility])    a. EF 25% TEE July 2013; b. EF normalized 2015;  c. 03/2015 Echo: EF 40-45%, difrf HK, PASP 38 mmHg, Mild MR, sev LAE/RAE.   Chronic constipation    takes OTC stool softener   COPD (chronic obstructive pulmonary disease) (HCC)    one dr says COPD; one dr says emphysema (09/18/2017)   COVID-19 12/09/2019   DEPRESSION    takes Zoloft  and Doxepin  daily   Diverticulitis    DYSKINESIA, ESOPHAGUS    Essential hypertension        FIBROMYALGIA    GERD (gastroesophageal reflux disease)        Glaucoma    HYPERLIPIDEMIA    a. Intolerant to statins.   INSOMNIA    takes Ambien  nightly    Myocardial infarction [facility])    a. 2012 Myoview  notable for prior infarct;  b. 03/2015 Lexiscan  CL: EF 37%, diff HK, small area of inferior infarct from apex to base-->Med Rx.   O2 dependent    2.5L q hs & prn (09/18/2017)   Paroxysmal atrial fibrillation (HCC)    a. CHA2DS2VASc = 3--> takes Coumadin ;  b. 03/15/2015 Successful TEE/DCCV;  c. 03/2015 recurrent afib, Amio d/c'd in setting of hyperthyroidism.   Peripheral neuropathy    Pneumonia 12/2016   Rash and other nonspecific skin eruption 04/12/2009   no cause found saw dermatologists x 2 and allergist   SLEEP APNEA, OBSTRUCTIVE    a. doesn't use CPAP   Syncope    a. 03/2015 s/p MDT LINQ.   Type II diabetes mellitus (HCC)        Past Surgical History:  Procedure Laterality Date   ACNE CYST REMOVAL     2 on back    AMPUTATION TOE Right 06/01/2024   Procedure: AMPUTATION, TOE;  Surgeon: Janit Thresa HERO, DPM;  Location: [facility] OR;  Service: Orthopedics/Podiatry;  Laterality: Right;  RIGHT 2ND TOE IV SEDATION   AV NODE ABLATION N/A 10/25/2017   Procedure: AV NODE ABLATION;  Surgeon: Fernande Elspeth BROCKS, MD;  Location: [facility] INVASIVE CV LAB;  Service: Cardiovascular;  Laterality: N/A;   BIV  ICD INSERTION CRT-D N/A 09/18/2017   Procedure: BIV ICD INSERTION CRT-D;  Surgeon: Fernande Elspeth BROCKS, MD;  Location: [facility] INVASIVE CV LAB;  Service: Cardiovascular;  Laterality: N/A;   CARDIAC CATHETERIZATION N/A 03/21/2016   Procedure: Right/Left Heart Cath and Coronary Angiography;  Surgeon: Ezra GORMAN Shuck, MD;  Location: [facility] INVASIVE CV LAB;  Service: Cardiovascular;  Laterality: N/A;   CARDIOVERSION  04/18/2012   Procedure: CARDIOVERSION;  Surgeon: Vina LULLA Gull, MD;  Location: [facility] OR;  Service: Cardiovascular;  Laterality: N/A;   CARDIOVERSION  04/25/2012   Procedure: CARDIOVERSION;  Surgeon: Aleene JINNY Passe, MD;  Location: [facility] ENDOSCOPY;  Service: Cardiovascular;  Laterality: N/A;   CARDIOVERSION  04/25/2012   Procedure: CARDIOVERSION;  Surgeon: Vina LULLA Gull, MD;  Location:  [facility] OR;  Service: Cardiovascular;  Laterality: N/A;   CARDIOVERSION  05/09/2012   Procedure: CARDIOVERSION;  Surgeon: Ozell Fell, MD;  Location: [facility] OR;  Service: Cardiovascular;  Laterality: N/A;  changed from crenshaw to cooper by trish/leone-endo   CARDIOVERSION N/A 03/15/2015   Procedure: CARDIOVERSION;  Surgeon: Aleene JINNY Passe, MD;  Location: [facility] ENDOSCOPY;  Service: Cardiovascular;  Laterality: N/A;   COLONOSCOPY     COLONOSCOPY WITH PROPOFOL  N/A 10/21/2014   Procedure: COLONOSCOPY WITH PROPOFOL ;  Surgeon: Gwendlyn ONEIDA Buddy, MD;  Location: [facility] ENDOSCOPY;  Service: Endoscopy;  Laterality: N/A;   EP IMPLANTABLE DEVICE N/A 04/06/2015   Procedure: Loop Recorder Insertion;  Surgeon: Danelle LELON Birmingham, MD;  Location: [facility] INVASIVE CV LAB;  Service: Cardiovascular;  Laterality: N/A;   ESOPHAGOGASTRODUODENOSCOPY     JOINT REPLACEMENT     LOOP RECORDER REMOVAL N/A 09/18/2017   Procedure: LOOP RECORDER REMOVAL;  Surgeon: Fernande Elspeth BROCKS, MD;  Location: [facility] INVASIVE CV LAB;  Service: Cardiovascular;  Laterality: N/A;   RIGHT/LEFT HEART CATH AND CORONARY ANGIOGRAPHY N/A 01/28/2017   Procedure: Right/Left Heart Cath and Coronary Angiography;  Surgeon: Ezra GORMAN Shuck, MD;  Location: [facility] INVASIVE CV LAB;  Service: Cardiovascular;  Laterality: N/A;   TEE WITHOUT CARDIOVERSION  04/25/2012   Procedure: TRANSESOPHAGEAL ECHOCARDIOGRAM (TEE);  Surgeon: Aleene JINNY Passe, MD;  Location: [facility] ENDOSCOPY;  Service: Cardiovascular;  Laterality: N/A;   TEE WITHOUT CARDIOVERSION N/A 03/15/2015   Procedure: TRANSESOPHAGEAL ECHOCARDIOGRAM (TEE);  Surgeon: Aleene JINNY Passe, MD;  Location: [facility] ENDOSCOPY;  Service: Cardiovascular;  Laterality: N/A;   TONSILLECTOMY AND ADENOIDECTOMY     TOTAL KNEE ARTHROPLASTY Right 06/15/2014   Procedure: TOTAL KNEE ARTHROPLASTY;  Surgeon: Evalene JONETTA Chancy, MD;  Location: [facility] OR;  Service: Orthopedics;  Laterality: Right;   TOTAL KNEE ARTHROPLASTY Left 10/17/2021   Procedure: Left TOTAL KNEE ARTHROPLASTY;   Surgeon: Vernetta Lonni GRADE, MD;  Location: [facility] OR;  Service: Orthopedics;  Laterality: Left;   Social History   Socioeconomic History   Marital status: Divorced    Spouse name: Not on file   Number of children: 2   Years of education: Not on file   Highest education level: GED or equivalent  Occupational History   Occupation: retired/disabled. prev worked in Teacher, English as a foreign language.    Employer: DISABLED  Tobacco Use   Smoking status: Former    Current packs/day: 0.00    Average packs/day: 2.0 packs/day for 30.0 years (60.0 ttl pk-yrs)    Types: Cigarettes    Start date: 10/15/1977    Quit date: 10/16/2007    Years since quitting: 16.7   Smokeless tobacco: Never  Vaping Use   Vaping status: Never Used  Substance and Sexual Activity   Alcohol use: No  Drug use: No   Sexual activity: Not Currently  Other Topics Concern   Not on file  Social History Narrative   Lives with girlfriend   Social Drivers of Health   Financial Resource Strain: Medium Risk (01/01/2024)   Overall Financial Resource Strain (CARDIA)    Difficulty of Paying Living Expenses: Somewhat hard  Food Insecurity: No Food Insecurity (04/13/2024)   Hunger Vital Sign    Worried About Running Out of Food in the Last Year: Never true    Ran Out of Food in the Last Year: Never true  Transportation Needs: No Transportation Needs (04/13/2024)   PRAPARE - Administrator, Civil Service (Medical): No    Lack of Transportation (Non-Medical): No  Physical Activity: Inactive (01/01/2024)   Exercise Vital Sign    Days of Exercise per Week: 0 days    Minutes of Exercise per Session: 0 min  Stress: No Stress Concern Present (01/01/2024)   [employer] of Occupational Health - Occupational Stress Questionnaire    Feeling of Stress : Only a little  Social Connections: Socially Isolated (04/13/2024)   Social Connection and Isolation Panel    Frequency of Communication with Friends and Family: Once a week     Frequency of Social Gatherings with Friends and Family: Never    Attends Religious Services: Never    Diplomatic Services operational officer: No    Attends Engineer, structural: Never    Marital Status: Divorced   Allergies  Allergen Reactions   Amiodarone  Other (See Comments)    Caused hyperthyroidism    Statins Other (See Comments)    Myalgias    Sulfa  Antibiotics Other (See Comments)    Mouth ulcers   Wound Dressing Adhesive Other (See Comments)    Skin Tears Use Paper Tape Only   Family History  Problem Relation Age of Onset   COPD Mother    Asthma Mother    Colon polyps Mother    Allergies Mother    Hypothyroidism Mother    Asthma Maternal Grandmother    Colon cancer Neg Hx     Current Outpatient Medications (Endocrine & Metabolic):    predniSONE  (DELTASONE ) 10 MG tablet, TAKE 1 TABLET (10 MG TOTAL) BY MOUTH DAILY WITH BREAKFAST.   predniSONE  (DELTASONE ) 10 MG tablet, 3 tabs by mouth per day for 3 days,2tabs per day for 3 days,1tab per day for 3 days   tirzepatide  (MOUNJARO ) 7.5 MG/0.5ML Pen, Inject 7.5 mg into the skin once a week.  Current Outpatient Medications (Cardiovascular):    eplerenone  (INSPRA ) 25 MG tablet, Take 25 mg by mouth daily.   Evolocumab  (REPATHA  SURECLICK) 140 MG/ML SOAJ, INJECT 140 MG INTO THE SKIN EVERY 14 DAYS   losartan  (COZAAR ) 25 MG tablet, Take 25 mg by mouth daily.   torsemide  (DEMADEX ) 20 MG tablet, Take 4 tablets (80 mg total) by mouth 2 (two) times daily.  Current Outpatient Medications (Respiratory):    albuterol  (VENTOLIN  HFA) 108 (90 Base) MCG/ACT inhaler, INHALE 1-2 PUFFS BY MOUTH EVERY 6 HOURS AS NEEDED FOR WHEEZE OR SHORTNESS OF BREATH   fluticasone  (FLONASE ) 50 MCG/ACT nasal spray, Place 2 sprays into both nostrils daily as needed for allergies or rhinitis.   ipratropium-albuterol  (DUONEB) 0.5-2.5 (3) MG/3ML SOLN, TAKE 3 ML BY NEBULIZATION EVERY 4 HOURS AS NEEDED (WHEEZING).   Tiotropium Bromide  Monohydrate (SPIRIVA   RESPIMAT) 2.5 MCG/ACT AERS, Take 2 puffs by nebulization daily.   WIXELA INHUB 250-50 MCG/ACT AEPB, INHALE 1 PUFF INTO  THE LUNGS IN THE MORNING AND AT BEDTIME.  Current Outpatient Medications (Analgesics):    acetaminophen  (TYLENOL ) 325 MG tablet, Take 2 tablets (650 mg total) by mouth every 6 (six) hours as needed for fever or mild pain (pain score 1-3).   oxyCODONE  (ROXICODONE ) 5 MG immediate release tablet, Take 1 tablet (5 mg total) by mouth every 6 (six) hours as needed for severe pain (pain score 7-10).   oxyCODONE -acetaminophen  (PERCOCET) 5-325 MG tablet, Take 1 tablet by mouth every 6 (six) hours as needed for severe pain (pain score 7-10).   traMADol  (ULTRAM ) 50 MG tablet, Take 1 tablet (50 mg total) by mouth 3 (three) times daily.  Current Outpatient Medications (Hematological):    apixaban  (ELIQUIS ) 5 MG TABS tablet, Take 1 tablet (5 mg total) by mouth 2 (two) times daily. (Patient taking differently: Take 5 mg by mouth daily.)   Cyanocobalamin  (VITAMIN B-12) 2500 MCG SUBL, Place 2,500 mcg under the tongue daily.  Current Outpatient Medications (Other):    ALPRAZolam  (XANAX ) 0.5 MG tablet, TAKE 2 TABLETS (1 MG TOTAL) BY MOUTH EVERY DAY AT BEDTIME   ammonium lactate  (AMLACTIN) 12 % cream, Apply 1 Application topically as needed for dry skin.   ascorbic acid (VITAMIN C) 500 MG tablet, Take 500 mg by mouth daily.   augmented betamethasone  dipropionate (DIPROLENE -AF) 0.05 % cream, Apply 1 application  topically daily as needed (to affected areas- for itching).   Cholecalciferol  (VITAMIN D3) 50 MCG (2000 UT) TABS, Take 2,000 Units by mouth daily.   doxepin  (SINEQUAN ) 10 MG capsule, Take 2 capsules (20 mg total) by mouth at bedtime.   doxycycline  (VIBRA -TABS) 100 MG tablet, Take 1 tablet (100 mg total) by mouth 2 (two) times daily.   DULoxetine  (CYMBALTA ) 60 MG capsule, Take 1 capsule (60 mg total) by mouth daily.   Dupilumab (DUPIXENT) 300 MG/2ML SOPN, Inject 300 mg into the skin every 14  (fourteen) days.   esomeprazole  (NEXIUM ) 40 MG capsule, Take 1 capsule (40 mg total) by mouth daily at 12 noon.   gabapentin  (NEURONTIN ) 100 MG capsule, Take 1 capsule (100 mg total) by mouth at bedtime.   lidocaine  (LIDODERM ) 5 %, Place 1 patch onto the skin daily. Remove & Discard patch within 12 hours or as directed by MD (Patient taking differently: Place 1 patch onto the skin daily as needed (Pain).)   linaclotide  (LINZESS ) 72 MCG capsule, Take 1 capsule (72 mcg total) by mouth daily before breakfast.   magnesium  hydroxide (MILK OF MAGNESIA) 400 MG/5ML suspension, Take 5 mLs by mouth daily as needed for mild constipation.   meclizine  (ANTIVERT ) 25 MG tablet, Take 1 tablet (25 mg total) by mouth 3 (three) times daily as needed for dizziness.   Multiple Vitamins-Minerals (SENIOR VITES PO), Take 1 tablet by mouth daily with breakfast.   omeprazole  (PRILOSEC) 20 MG capsule, TAKE 1 CAPSULE BY MOUTH TWICE A DAY   ondansetron  (ZOFRAN -ODT) 4 MG disintegrating tablet, TAKE 1 TABLET BY MOUTH EVERY 8 HOURS AS NEEDED   potassium chloride  SA (KLOR-CON  M) 20 MEQ tablet, Take 60 mEq by mouth daily.   tamsulosin  (FLOMAX ) 0.4 MG CAPS capsule, Take 1 capsule (0.4 mg total) by mouth daily.   tiZANidine  (ZANAFLEX ) 4 MG tablet, TAKE 1 TABLET BY MOUTH 3 TIMES DAILY.   traZODone  (DESYREL ) 100 MG tablet, TAKE 2 TABLETS BY MOUTH AT BEDTIME AS NEEDED (Patient taking differently: Take 200 mg by mouth at bedtime.)   TUMS 500 MG chewable tablet, Chew 1 tablet by mouth  2 (two) times daily as needed for indigestion or heartburn.   valACYclovir  (VALTREX ) 1000 MG tablet, Take 1,000 mg by mouth 3 (three) times daily as needed (Outbreaks).   venlafaxine  XR (EFFEXOR -XR) 150 MG 24 hr capsule, TAKE 1 CAPSULE BY MOUTH DAILY WITH BREAKFAST.   zolpidem  (AMBIEN ) 5 MG tablet, Take 5 mg by mouth at bedtime.   Reviewed prior external information including notes and imaging from  primary care provider As well as notes that were  available from care everywhere and other healthcare systems.  Past medical history, social, surgical and family history all reviewed in electronic medical record.  No pertanent information unless stated regarding to the chief complaint.   Review of Systems:  No headache, visual changes, nausea, vomiting, diarrhea, constipation, dizziness, abdominal pain, skin rash, fevers, chills, night sweats, weight loss, swollen lymph nodes, body aches, joint swelling, chest pain, shortness of breath, mood changes. POSITIVE muscle aches  Objective  Blood pressure 108/78, pulse 78, height 5' 10 (1.778 m), SpO2 98%.   General: No apparent distress alert and oriented x3 mood and affect normal, dressed appropriately.  HEENT: Pupils equal, extraocular movements intact  Respiratory: Patient's speak in full sentences and does not appear short of breath  Cardiovascular: No lower extremity edema, non tender, no erythema  Bilateral shoulder exam shows significant arthritic changes of the shoulders bilaterally.  No worsening torticollis of the neck noted as well.  Difficulty even looking up 5 degrees now with the neck.  Patient has significant crepitus noted as well of the shoulders. Procedure: Real-time Ultrasound Guided Injection of right glenohumeral joint Device: GE Logiq Q7  Ultrasound guided injection is preferred based studies that show increased duration, increased effect, greater accuracy, decreased procedural pain, increased response rate with ultrasound guided versus blind injection.  Verbal informed consent obtained.  Time-out conducted.  Noted no overlying erythema, induration, or other signs of local infection.  Skin prepped in a sterile fashion.  Local anesthesia: Topical Ethyl chloride.  With sterile technique and under real time ultrasound guidance:  Joint visualized.  23g 1  inch needle inserted posterior approach. Pictures taken for needle placement. Patient did have injection of 2 cc of 1%  lidocaine , 2 cc of 0.5% Marcaine , and 1.0 cc of Kenalog  40 mg/dL. Completed without difficulty  Pain immediately resolved suggesting accurate placement of the medication.  Advised to call if fevers/chills, erythema, induration, drainage, or persistent bleeding.  Images saved  Impression: Technically successful ultrasound guided injection.  Procedure: Real-time Ultrasound Guided Injection of left glenohumeral joint Device: GE Logiq E  Ultrasound guided injection is preferred based studies that show increased duration, increased effect, greater accuracy, decreased procedural pain, increased response rate with ultrasound guided versus blind injection.  Verbal informed consent obtained.  Time-out conducted.  Noted no overlying erythema, induration, or other signs of local infection.  Skin prepped in a sterile fashion.  Local anesthesia: Topical Ethyl chloride.  With sterile technique and under real time ultrasound guidance:  Joint visualized.  21g 2 inch needle inserted posterior approach. Pictures taken for needle placement. Patient did have injection of 2 cc of 0.5% Marcaine , and 1cc of Kenalog  40 mg/dL. Completed without difficulty  Pain immediately resolved suggesting accurate placement of the medication.  Advised to call if fevers/chills, erythema, induration, drainage, or persistent bleeding.  Images permanently stored  Impression: Technically successful ultrasound guided injection.   Impression and Recommendations:  Rotator cuff arthropathy of both shoulders Bilateral injections given today, tolerated the procedure well.  Patient does  have significant torticollis of the neck as well that I think is contributing to some more of the discomfort and pain as well.  Discussed icing regimen and home exercises, increase activity slowly.  Follow-up again in 3 to 4 months   The above documentation has been reviewed and is accurate and complete [name] M Zachary Barefoot, DO

## 2024-07-24 ENCOUNTER — Telehealth: Payer: Self-pay | Admitting: Internal Medicine

## 2024-07-24 ENCOUNTER — Other Ambulatory Visit: Payer: Self-pay | Admitting: Internal Medicine

## 2024-07-24 NOTE — Telephone Encounter (Signed)
 Copied from CRM [id number]. Topic: Clinical - Medication Refill >> Jul 24, 2024  5:29 PM Alfonso ORN wrote: Medication:  DULoxetine  (CYMBALTA ) 60 MG capsule   Has the patient contacted their pharmacy? No  This is the patient's preferred pharmacy:  [id number] [place], [place] - [address] [place] [address] Phone: [phone] Fax: [phone]   Is this the correct pharmacy for this prescription? Yes If no, delete pharmacy and type the correct one.   Has the prescription been filled recently? No  Is the patient out of the medication? Yes  Has the patient been seen for an appointment in the last year OR does the patient have an upcoming appointment? Yes  Can we respond through MyChart? Yes

## 2024-07-27 ENCOUNTER — Other Ambulatory Visit: Payer: Self-pay

## 2024-07-27 MED ORDER — DULOXETINE HCL 60 MG PO CPEP: 60.0000 mg | ORAL_CAPSULE | Freq: Every day | ORAL | 0 refills | Status: DC

## 2024-07-28 ENCOUNTER — Other Ambulatory Visit: Payer: Self-pay

## 2024-07-28 ENCOUNTER — Ambulatory Visit: Admitting: Family Medicine

## 2024-07-28 VITALS — BP 108/78 | HR 78 | Ht 70.0 in

## 2024-07-28 DIAGNOSIS — M12811 Other specific arthropathies, not elsewhere classified, right shoulder: Secondary | ICD-10-CM | POA: Diagnosis not present

## 2024-07-28 DIAGNOSIS — M25512 Pain in left shoulder: Secondary | ICD-10-CM | POA: Diagnosis not present

## 2024-07-28 DIAGNOSIS — M25511 Pain in right shoulder: Secondary | ICD-10-CM | POA: Diagnosis not present

## 2024-07-28 DIAGNOSIS — M12812 Other specific arthropathies, not elsewhere classified, left shoulder: Secondary | ICD-10-CM | POA: Diagnosis not present

## 2024-07-28 DIAGNOSIS — G8929 Other chronic pain: Secondary | ICD-10-CM

## 2024-07-28 NOTE — Assessment & Plan Note (Signed)
 Bilateral injections given today, tolerated the procedure well.  Patient does have significant torticollis of the neck as well that I think is contributing to some more of the discomfort and pain as well.  Discussed icing regimen and home exercises, increase activity slowly.  Follow-up again in 3 to 4 months

## 2024-07-28 NOTE — Patient Instructions (Signed)
 Injection in both shoulders See me again in 8-10 weeks

## 2024-07-29 ENCOUNTER — Ambulatory Visit (INDEPENDENT_AMBULATORY_CARE_PROVIDER_SITE_OTHER): Admitting: Pulmonary Disease

## 2024-07-29 ENCOUNTER — Encounter (HOSPITAL_BASED_OUTPATIENT_CLINIC_OR_DEPARTMENT_OTHER): Payer: Self-pay | Admitting: Pulmonary Disease

## 2024-07-29 VITALS — BP 102/59 | HR 71 | Ht 70.0 in | Wt 230.0 lb

## 2024-07-29 DIAGNOSIS — Z87891 Personal history of nicotine dependence: Secondary | ICD-10-CM | POA: Diagnosis not present

## 2024-07-29 DIAGNOSIS — J9611 Chronic respiratory failure with hypoxia: Secondary | ICD-10-CM

## 2024-07-29 DIAGNOSIS — I5032 Chronic diastolic (congestive) heart failure: Secondary | ICD-10-CM

## 2024-07-29 DIAGNOSIS — R0602 Shortness of breath: Secondary | ICD-10-CM | POA: Diagnosis not present

## 2024-07-29 DIAGNOSIS — R0902 Hypoxemia: Secondary | ICD-10-CM | POA: Diagnosis not present

## 2024-07-29 DIAGNOSIS — J4489 Other specified chronic obstructive pulmonary disease: Secondary | ICD-10-CM

## 2024-07-29 DIAGNOSIS — G4733 Obstructive sleep apnea (adult) (pediatric): Secondary | ICD-10-CM

## 2024-07-29 DIAGNOSIS — J449 Chronic obstructive pulmonary disease, unspecified: Secondary | ICD-10-CM

## 2024-07-29 DIAGNOSIS — G4734 Idiopathic sleep related nonobstructive alveolar hypoventilation: Secondary | ICD-10-CM

## 2024-07-29 NOTE — Progress Notes (Signed)
 Subjective:   PATIENT ID: [name] GENDER: male DOB: December 02, 1955, MRN: [id number]  Chief Complaint  Patient presents with   COPD    Reason for Visit: Follow-up  [name] is a 68 year old male former smoker with COPD, childhood asthma, OSA not on CPAP, DM2, HTN, atrial fib on eliquis , NICM s/p AICD, chronic diastolic heart failure who presents for follow-up.  He was recently treated for COPD exacerbation on 09/24/22. Treated with augmentin  and doxycycline . On duonebs and albuterol . Referred to Pulmonary. Cough and wheezing has improved. He reports shortness of breath with walking a few steps. Denies chronic cough. Has baseline wheezing. Albuterol  usually controls his symptoms. Currently on Dupixent which is managed by his dermatologist. Has been on it for two years and feels this helps his breathing. Before the pandemic [facility].   12/31/22 Since our last visit he has had worsening shortness of breath associated with exertional wheezing. Uses nebulizer twice a day. Compliant with Trelegy daily. Congestion has worsened with the pollen. Reports improved cough and wheezing overall but still persistent. Denies recent fevers, chills. He was treated for RLE cellulitis last month but also treated for heart failure while inpatient.   02/13/23 Since our last visit he was treated for exacerbation and feeling better after steroids. Continues to have shortness of breath, cough and occasional wheezing. He is not on Trelegy and using Duonebs twice a day. He uses albuterol  twice a day on average. He initially did not want to walk or be evaluated for oxygen  needs due to concern for price and not wanting to wear oxygen . Unable to afford Dupixent and does not qualify for assistance per patient with Allergy .   06/13/23 Since our last visit he reports worsening shortness of breath. Stays at home and minimizes activity. Limited due to knee issues and LLE  wound. When he exerts himself he feels weak, shaky and nauseated. Baseline cough and occasional wheezing unchanged. Complains of right sided neck swelling for >1 week and feels like it is affecting his breathing. Denies fevers or chills. Denies problems swallowing. Does have a preference to lean head to the right side  09/10/23 Since our last visit he has unchanged shortness of breath but has had chronic neck pain for months that causes his neck to be positioned poorly while sleeping. Has cough or wheezing with activity. Compliant with wixela and spiriva . No respiratory infections since our last visit.   01/21/24 Since our last visit he reports his respiratory symptoms have improved since the colder weather as resolved. He is compliant with Wixela, Spiriva  and Albuterol  up to twice a day. Minimal wheezing. Limited activity with cane but able to mobilize in the home. Denies exacerbations since our last visit.  07/29/24 Since our last visit he has been compliant with Wixela and Spiriva . Uses albuterol  2-3 times a week in setting of activity and for wheezing. Wears O2 24/7. Walks with walker at home. At baseline he has shortness of breath and wheezing with activity. Denies cough. Denies respiratory illness but was hospitalized in April and June/July 2025 and has been weak since then. Went to rehab and had home PT but didn't find staff helpful.  Social History: Quit in 2009. Former smoker 60 pack -years   Past Medical History:  Diagnosis Date   AICD (automatic cardioverter/defibrillator) present    Anemia    supposed to be taking Vit B but doesn't   ANXIETY  takes Xanax  nightly   Arthritis    Asthma    Albuterol  prn and Advair  daily;also takes Prednisone  daily   Cardiomyopathy [facility])    a. EF 25% TEE July 2013; b. EF normalized 2015;  c. 03/2015 Echo: EF 40-45%, difrf HK, PASP 38 mmHg, Mild MR, sev LAE/RAE.   Chronic constipation    takes OTC stool softener   COPD (chronic obstructive pulmonary  disease) (HCC)    one dr says COPD; one dr says emphysema (09/18/2017)   COVID-19 12/09/2019   DEPRESSION    takes Zoloft  and Doxepin  daily   Diverticulitis    DYSKINESIA, ESOPHAGUS    Essential hypertension        FIBROMYALGIA    GERD (gastroesophageal reflux disease)        Glaucoma    HYPERLIPIDEMIA    a. Intolerant to statins.   INSOMNIA    takes Ambien  nightly   Myocardial infarction [facility])    a. 2012 Myoview  notable for prior infarct;  b. 03/2015 Lexiscan  CL: EF 37%, diff HK, small area of inferior infarct from apex to base-->Med Rx.   O2 dependent    2.5L q hs & prn (09/18/2017)   Paroxysmal atrial fibrillation (HCC)    a. CHA2DS2VASc = 3--> takes Coumadin ;  b. 03/15/2015 Successful TEE/DCCV;  c. 03/2015 recurrent afib, Amio d/c'd in setting of hyperthyroidism.   Peripheral neuropathy    Pneumonia 12/2016   Rash and other nonspecific skin eruption 04/12/2009   no cause found saw dermatologists x 2 and allergist   SLEEP APNEA, OBSTRUCTIVE    a. doesn't use CPAP   Syncope    a. 03/2015 s/p MDT LINQ.   Type II diabetes mellitus (HCC)          Family History  Problem Relation Age of Onset   COPD Mother    Asthma Mother    Colon polyps Mother    Allergies Mother    Hypothyroidism Mother    Asthma Maternal Grandmother    Colon cancer Neg Hx      Social History   Occupational History   Occupation: retired/disabled. prev worked in Teacher, English as a foreign language.    Employer: DISABLED  Tobacco Use   Smoking status: Former    Current packs/day: 0.00    Average packs/day: 2.0 packs/day for 30.0 years (60.0 ttl pk-yrs)    Types: Cigarettes    Start date: 10/15/1977    Quit date: 10/16/2007    Years since quitting: 16.7   Smokeless tobacco: Never  Vaping Use   Vaping status: Never Used  Substance and Sexual Activity   Alcohol use: No   Drug use: No   Sexual activity: Not Currently    Allergies  Allergen Reactions   Amiodarone  Other (See Comments)    Caused  hyperthyroidism    Statins Other (See Comments)    Myalgias    Sulfa  Antibiotics Other (See Comments)    Mouth ulcers   Wound Dressing Adhesive Other (See Comments)    Skin Tears Use Paper Tape Only     Outpatient Medications Prior to Visit  Medication Sig Dispense Refill   acetaminophen  (TYLENOL ) 325 MG tablet Take 2 tablets (650 mg total) by mouth every 6 (six) hours as needed for fever or mild pain (pain score 1-3).     albuterol  (VENTOLIN  HFA) 108 (90 Base) MCG/ACT inhaler INHALE 1-2 PUFFS BY MOUTH EVERY 6 HOURS AS NEEDED FOR WHEEZE OR SHORTNESS OF BREATH 8.5 each 5   ALPRAZolam  (XANAX ) 0.5 MG tablet  TAKE 2 TABLETS (1 MG TOTAL) BY MOUTH EVERY DAY AT BEDTIME 60 tablet 1   ammonium lactate  (AMLACTIN) 12 % cream Apply 1 Application topically as needed for dry skin. 385 g 3   apixaban  (ELIQUIS ) 5 MG TABS tablet Take 1 tablet (5 mg total) by mouth 2 (two) times daily. (Patient taking differently: Take 5 mg by mouth daily.) 60 tablet 11   ascorbic acid (VITAMIN C) 500 MG tablet Take 500 mg by mouth daily.     augmented betamethasone  dipropionate (DIPROLENE -AF) 0.05 % cream Apply 1 application  topically daily as needed (to affected areas- for itching).     Cholecalciferol  (VITAMIN D3) 50 MCG (2000 UT) TABS Take 2,000 Units by mouth daily.     Cyanocobalamin  (VITAMIN B-12) 2500 MCG SUBL Place 2,500 mcg under the tongue daily.     doxepin  (SINEQUAN ) 10 MG capsule Take 2 capsules (20 mg total) by mouth at bedtime. 180 capsule 1   DULoxetine  (CYMBALTA ) 60 MG capsule Take 1 capsule (60 mg total) by mouth daily. 90 capsule 0   Dupilumab (DUPIXENT) 300 MG/2ML SOPN Inject 300 mg into the skin every 14 (fourteen) days.     eplerenone  (INSPRA ) 25 MG tablet Take 25 mg by mouth daily.     esomeprazole  (NEXIUM ) 40 MG capsule Take 1 capsule (40 mg total) by mouth daily at 12 noon. 30 capsule 2   Evolocumab  (REPATHA  SURECLICK) 140 MG/ML SOAJ INJECT 140 MG INTO THE SKIN EVERY 14 DAYS 6 mL 3   fluticasone   (FLONASE ) 50 MCG/ACT nasal spray Place 2 sprays into both nostrils daily as needed for allergies or rhinitis. 48 mL 1   gabapentin  (NEURONTIN ) 100 MG capsule Take 1 capsule (100 mg total) by mouth at bedtime.     ipratropium-albuterol  (DUONEB) 0.5-2.5 (3) MG/3ML SOLN TAKE 3 ML BY NEBULIZATION EVERY 4 HOURS AS NEEDED (WHEEZING). 360 mL 0   lidocaine  (LIDODERM ) 5 % Place 1 patch onto the skin daily. Remove & Discard patch within 12 hours or as directed by MD (Patient taking differently: Place 1 patch onto the skin daily as needed (Pain).) 30 patch 2   linaclotide  (LINZESS ) 72 MCG capsule Take 1 capsule (72 mcg total) by mouth daily before breakfast. 90 capsule 3   losartan  (COZAAR ) 25 MG tablet Take 25 mg by mouth daily.     magnesium  hydroxide (MILK OF MAGNESIA) 400 MG/5ML suspension Take 5 mLs by mouth daily as needed for mild constipation. 355 mL 0   meclizine  (ANTIVERT ) 25 MG tablet Take 1 tablet (25 mg total) by mouth 3 (three) times daily as needed for dizziness. 30 tablet 0   Multiple Vitamins-Minerals (SENIOR VITES PO) Take 1 tablet by mouth daily with breakfast.     omeprazole  (PRILOSEC) 20 MG capsule TAKE 1 CAPSULE BY MOUTH TWICE A DAY 180 capsule 1   ondansetron  (ZOFRAN -ODT) 4 MG disintegrating tablet TAKE 1 TABLET BY MOUTH EVERY 8 HOURS AS NEEDED 30 tablet 1   oxyCODONE  (ROXICODONE ) 5 MG immediate release tablet Take 1 tablet (5 mg total) by mouth every 6 (six) hours as needed for severe pain (pain score 7-10). 30 tablet 0   oxyCODONE -acetaminophen  (PERCOCET) 5-325 MG tablet Take 1 tablet by mouth every 6 (six) hours as needed for severe pain (pain score 7-10). 20 tablet 0   potassium chloride  SA (KLOR-CON  M) 20 MEQ tablet Take 60 mEq by mouth daily.     predniSONE  (DELTASONE ) 10 MG tablet TAKE 1 TABLET (10 MG TOTAL) BY MOUTH DAILY  WITH BREAKFAST. 90 tablet 0   tamsulosin  (FLOMAX ) 0.4 MG CAPS capsule Take 1 capsule (0.4 mg total) by mouth daily. 30 capsule 0   Tiotropium Bromide  Monohydrate  (SPIRIVA  RESPIMAT) 2.5 MCG/ACT AERS Take 2 puffs by nebulization daily. 4 g 3   tirzepatide  (MOUNJARO ) 7.5 MG/0.5ML Pen Inject 7.5 mg into the skin once a week. 6 mL 3   tiZANidine  (ZANAFLEX ) 4 MG tablet TAKE 1 TABLET BY MOUTH 3 TIMES DAILY. 270 tablet 2   torsemide  (DEMADEX ) 20 MG tablet Take 4 tablets (80 mg total) by mouth 2 (two) times daily. 720 tablet 2   traMADol  (ULTRAM ) 50 MG tablet Take 1 tablet (50 mg total) by mouth 3 (three) times daily. 90 tablet 5   traZODone  (DESYREL ) 100 MG tablet TAKE 2 TABLETS BY MOUTH AT BEDTIME AS NEEDED (Patient taking differently: Take 200 mg by mouth at bedtime.) 180 tablet 1   TUMS 500 MG chewable tablet Chew 1 tablet by mouth 2 (two) times daily as needed for indigestion or heartburn.     valACYclovir  (VALTREX ) 1000 MG tablet Take 1,000 mg by mouth 3 (three) times daily as needed (Outbreaks).     venlafaxine  XR (EFFEXOR -XR) 150 MG 24 hr capsule TAKE 1 CAPSULE BY MOUTH DAILY WITH BREAKFAST. 90 capsule 3   WIXELA INHUB 250-50 MCG/ACT AEPB INHALE 1 PUFF INTO THE LUNGS IN THE MORNING AND AT BEDTIME. 60 each 8   zolpidem  (AMBIEN ) 5 MG tablet Take 5 mg by mouth at bedtime.     doxycycline  (VIBRA -TABS) 100 MG tablet Take 1 tablet (100 mg total) by mouth 2 (two) times daily. 20 tablet 0   predniSONE  (DELTASONE ) 10 MG tablet 3 tabs by mouth per day for 3 days,2tabs per day for 3 days,1tab per day for 3 days (Patient not taking: Reported on 07/29/2024) 18 tablet 0   No facility-administered medications prior to visit.    Review of Systems  Constitutional:  Negative for chills, diaphoresis, fever, malaise/fatigue and weight loss.  HENT:  Negative for congestion.   Respiratory:  Positive for shortness of breath and wheezing. Negative for cough, hemoptysis and sputum production.   Cardiovascular:  Negative for chest pain, palpitations and leg swelling.     Objective:   Vitals:   07/29/24 1407  BP: (!) 102/59  Pulse: 71  SpO2: 98%  Weight: 230 lb (104.3  kg)  Height: 5' 10 (1.778 m)   SpO2: 98 % (2L continous)  Body mass index is 33 kg/m.  Physical Exam: General: Chronically ill-appearing, torticollis, no acute distress HENT: [place], AT Eyes: EOMI, no scleral icterus Respiratory: Clear to auscultation bilaterally.  No crackles, wheezing or rales Cardiovascular: RRR, -M/R/G, no JVD Extremities:-Edema,-tenderness Neuro: AAO x4, CNII-XII grossly intact Psych: Normal mood, normal affect   Data Reviewed:  Imaging: CXR 05/05/21 - Cardiomegaly. S/p biventricular ICD. Small bilateral pleural effusion CXR 11/29/22 - Cardiomegaly. S/p biventricular ICD. Resolved effusions CXR 01/25/23 - Cardiomegaly. S/p biventricular ICD. Bibasilar atelectasis  PFT: 01/27/14 FVC 3.35 (72%) FEV1 2.41 (68%) Ratio 65  TLC 94% DLCO 77% Interpretation: Moderate obstructive defect with mildly reduced DLCO. Borderline bronchodilator response in FEV1 present.  Labs: CBC    Component Value Date/Time   WBC 14.8 (H) 05/20/2024 1605   RBC 3.84 (L) 05/20/2024 1605   HGB 11.0 (L) 05/20/2024 1605   HGB 12.8 (L) 12/19/2017 1532   HCT 35.1 (L) 05/20/2024 1605   HCT 38.0 12/19/2017 1532   PLT 284 05/20/2024 1605   PLT 208 12/19/2017 1532  MCV 91.4 05/20/2024 1605   MCV 99 (H) 12/19/2017 1532   MCH 28.6 05/20/2024 1605   MCHC 31.3 05/20/2024 1605   RDW 16.2 (H) 05/20/2024 1605   RDW 14.0 12/19/2017 1532   LYMPHSABS 2.2 05/04/2024 1554   LYMPHSABS 1.9 12/19/2017 1532   MONOABS 1.0 05/04/2024 1554   EOSABS 0.5 05/04/2024 1554   EOSABS 0.3 12/19/2017 1532   BASOSABS 0.1 05/04/2024 1554   BASOSABS 0.0 12/19/2017 1532   BMET    Component Value Date/Time   NA 139 05/28/2024 1347   NA 141 01/25/2023 1358   K 4.3 05/28/2024 1347   CL 101 05/28/2024 1347   CO2 30 05/28/2024 1347   GLUCOSE 87 05/28/2024 1347   BUN 26 (H) 05/28/2024 1347   BUN 41 (H) 01/25/2023 1358   CREATININE 1.07 05/28/2024 1347   CREATININE 0.92 03/19/2016 1420   CALCIUM  9.3 05/28/2024  1347   EGFR 56 (L) 01/25/2023 1358   GFRNONAA >60 05/28/2024 1347     Abs eos 12/20/21 -100  HST 07/15/23 showed mild  OSA with AHI 11/ hr with low saturation of 66%     Assessment & Plan:   Discussion: 68 year old male former smoker with COPD, childhood asthma, OSA not on CPAP, DM2, HTN, atrial fib on eliquis , NICM s/p AICD, chronic diastolic heart failure who presents for follow-up. DOE is likely related to related to deconditioning, uncontrolled OSA/nocturnal hypoxemia and COPD. Discussed clinical course and management of COPD/asthma including optimizing bronchodilator regimen and action plan for exacerbation.    Assessment & Plan Nocturnal hypoxemia --06/20/23 overnight oximetry - Does not qualify for oxygen . --Was restarted on nocturnal oxygen  during hospitalization for acute illness --Patient declined CPAP --ONO on room air ordered OSA (obstructive sleep apnea) Last sleep test before 2013 Not on CPAP --06/25/23 home sleep study with mild OSA COPD with asthma (HCC) persistent symptoms, not in exacerbation --CONTINUE Wixela 250-50 mcg ONE puff in the morning and evening.  --CONTINUE Spiriva  2.5 mcg TWO puffs in the morning --CONTINUE Albuterol  AS NEEDED for shortness of breath or wheezing --CONTINUE Dupixent per Dermatology. Improves eczema but helps asthma as well --Consider Ohtuvayre  if symptoms persist. Patient declined for now --Encouraged aerobic exercise 30 minutes five days a week. Consider [facility] PT in the future Shortness of breath Likely multifactorial from above chronic lung conditions    Health Maintenance Immunization History  Administered Date(s) Administered   Fluad Quad(high Dose 65+) 06/29/2022   Fluad Trivalent(High Dose 65+) 07/01/2023   INFLUENZA, HIGH DOSE SEASONAL PF 06/22/2020, 07/07/2021   Influenza Inj Mdck Quad With Preservative 07/22/2019   Influenza Split 07/09/2011, 07/23/2012   Influenza Whole 09/16/2006, 08/26/2007, 07/27/2008, 07/26/2009,  07/07/2010   Influenza,inj,Quad PF,6+ Mos 06/30/2013, 08/19/2014, 06/27/2015, 07/13/2016, 07/09/2017, 07/18/2018   PFIZER(Purple Top)SARS-COV-2 Vaccination 03/21/2020, 04/11/2020, 11/03/2020   Pneumococcal Conjugate-13 04/29/2017   Pneumococcal Polysaccharide-23 03/29/2010, 07/07/2010, 02/23/2015, 03/17/2021   Td 10/16/2003   Tdap 02/23/2015   CT Lung Screen - not qualified. Quit > 15 years ago  Orders Placed This Encounter  Procedures   Pulse oximetry, overnight    Standing Status:   Future    Expiration Date:   07/29/2025    Scheduling Instructions:     On room air   No orders of the defined types were placed in this encounter.   Return in about 3 months (around 10/29/2024).  I have spent a total time of 35-minutes on the day of the appointment including chart review, data review, collecting history, coordinating care  and discussing medical diagnosis and plan with the patient/family. Past medical history, allergies, medications were reviewed. Pertinent imaging, labs and tests included in this note have been reviewed and interpreted independently by me.  Analiyah Lechuga Slater Staff, MD [facility] 07/29/2024 2:40 PM

## 2024-07-29 NOTE — Patient Instructions (Addendum)
 COPD-asthma overlap - persistent symptoms, not in exacerbation --CONTINUE Wixela 250-50 mcg ONE puff in the morning and evening.  --CONTINUE Spiriva  2.5 mcg TWO puffs in the morning --CONTINUE Albuterol  AS NEEDED for shortness of breath or wheezing --CONTINUE Dupixent per Dermatology. Improves eczema but helps asthma as well --Consider Ohtuvayre  if symptoms persist. Patient declined for now --Encouraged aerobic exercise 30 minutes five days a week. Consider [facility] PT in the future  Mild OSA not on CPAP Chronic hypoxemic respiratory failure Last sleep test before 2013 --06/25/23 home sleep study with mild OSA --06/20/23 overnight oximetry - Does not qualify for oxygen . --Was restarted on nocturnal oxygen  during hospitalization for acute illness --Patient declined CPAP --ONO on room air ordered

## 2024-07-30 ENCOUNTER — Ambulatory Visit (INDEPENDENT_AMBULATORY_CARE_PROVIDER_SITE_OTHER)

## 2024-07-30 DIAGNOSIS — Z23 Encounter for immunization: Secondary | ICD-10-CM

## 2024-07-30 MED ORDER — TAMSULOSIN HCL 0.4 MG PO CAPS: 0.4000 mg | ORAL_CAPSULE | Freq: Every day | ORAL | 5 refills | Status: AC

## 2024-07-30 NOTE — Telephone Encounter (Signed)
 Ok refill is done

## 2024-07-30 NOTE — Progress Notes (Signed)
 Pt received his flu vaccine today with no complications

## 2024-07-31 NOTE — Progress Notes (Signed)
 Monthly heart failure report shows elevated HF diagnostics over the prior month.   Agree with diuretic regimen per protocol as above.   If optivol does not change, will likely need in clinic check to re-calibrate.  Continue monthly reports with additional remote or in person follow up as needed.   Ozell Jodie Passey, PA-C

## 2024-07-31 NOTE — Progress Notes (Signed)
 Monthly heart failure report shows elevated HF diagnostics over the prior month.   Agree with diuretic regimen per protocol as above.   Continue monthly reports with additional remote or in person follow up as needed.   Ozell Jodie Passey, PA-C

## 2024-08-03 ENCOUNTER — Telehealth: Payer: Self-pay

## 2024-08-03 ENCOUNTER — Ambulatory Visit: Attending: Internal Medicine

## 2024-08-03 DIAGNOSIS — I5022 Chronic systolic (congestive) heart failure: Secondary | ICD-10-CM | POA: Diagnosis not present

## 2024-08-03 DIAGNOSIS — Z1211 Encounter for screening for malignant neoplasm of colon: Secondary | ICD-10-CM

## 2024-08-03 NOTE — Telephone Encounter (Signed)
 Ok this is done

## 2024-08-03 NOTE — Telephone Encounter (Signed)
 Copied from CRM #[id number]. Topic: Referral - Request for Referral >> Aug 03, 2024  2:36 PM Jasmin G wrote: Did the patient discuss referral with their provider in the last year? Yes (If No - schedule appointment) (If Yes - send message)  Appointment offered? No  Type of order/referral and detailed reason for visit: Appt needed for a colonoscopy  Preference of office, provider, location: Not discussed  If referral order, have you been seen by this specialty before? No (If Yes, this issue or another issue? When? Where?  Can we respond through MyChart? No

## 2024-08-04 ENCOUNTER — Ambulatory Visit

## 2024-08-04 ENCOUNTER — Telehealth (HOSPITAL_COMMUNITY): Payer: Self-pay

## 2024-08-04 NOTE — Telephone Encounter (Signed)
 Patient called stating that this morning he fell due to low bp(unable to tell me what it was) patient states EMS was called but they felt as if he does not need to be seen in the hospital. Patient wants to know if he should change and or stop taking one of his medications. Please advise.

## 2024-08-04 NOTE — Telephone Encounter (Signed)
 I think that he is now off most of his BP-active meds.  He needs to wear graded compression stockings during the day and see us  in the office.

## 2024-08-05 ENCOUNTER — Telehealth (HOSPITAL_BASED_OUTPATIENT_CLINIC_OR_DEPARTMENT_OTHER): Payer: Self-pay

## 2024-08-05 ENCOUNTER — Ambulatory Visit: Attending: Student in an Organized Health Care Education/Training Program

## 2024-08-05 DIAGNOSIS — I5022 Chronic systolic (congestive) heart failure: Secondary | ICD-10-CM | POA: Diagnosis not present

## 2024-08-05 DIAGNOSIS — Z95810 Presence of automatic (implantable) cardiac defibrillator: Secondary | ICD-10-CM | POA: Diagnosis not present

## 2024-08-05 NOTE — Telephone Encounter (Unsigned)
 Copied from CRM #[id number]. Topic: Clinical - Order For Equipment >> Aug 04, 2024 10:07 AM Corean SAUNDERS wrote: Reason for CRM: Patient states when he saw Dr. Kassie last week, he was advised she will be ordering him a over night oximetry meter but he has not been updated or had anything delivered, patient is kindly requesting a call back regarding this.

## 2024-08-05 NOTE — Assessment & Plan Note (Signed)
 Last sleep test before 2013 Not on CPAP --06/25/23 home sleep study with mild OSA

## 2024-08-06 LAB — CUP PACEART REMOTE DEVICE CHECK
Battery Remaining Longevity: 3 mo
Battery Voltage: 2.75 V
Brady Statistic AP VP Percent: 0 %
Brady Statistic AP VS Percent: 0 %
Brady Statistic AS VP Percent: 0 %
Brady Statistic AS VS Percent: 0 %
Brady Statistic RA Percent Paced: 0 %
Brady Statistic RV Percent Paced: 99.45 %
Date Time Interrogation Session: 20251021162407
HighPow Impedance: 81 Ohm
Implantable Lead Connection Status: 753985
Implantable Lead Connection Status: 753985
Implantable Lead Implant Date: 20181205
Implantable Lead Implant Date: 20181205
Implantable Lead Location: 753858
Implantable Lead Location: 753860
Implantable Lead Model: 4398
Implantable Pulse Generator Implant Date: 20181205
Lead Channel Impedance Value: 1064 Ohm
Lead Channel Impedance Value: 1254 Ohm
Lead Channel Impedance Value: 1254 Ohm
Lead Channel Impedance Value: 1292 Ohm
Lead Channel Impedance Value: 1311 Ohm
Lead Channel Impedance Value: 284.683
Lead Channel Impedance Value: 329.069
Lead Channel Impedance Value: 332.11 Ohm
Lead Channel Impedance Value: 342.257
Lead Channel Impedance Value: 345.547
Lead Channel Impedance Value: 361 Ohm
Lead Channel Impedance Value: 399 Ohm
Lead Channel Impedance Value: 4047 Ohm
Lead Channel Impedance Value: 551 Ohm
Lead Channel Impedance Value: 589 Ohm
Lead Channel Impedance Value: 817 Ohm
Lead Channel Impedance Value: 836 Ohm
Lead Channel Impedance Value: 950 Ohm
Lead Channel Pacing Threshold Amplitude: 0.375 V
Lead Channel Pacing Threshold Pulse Width: 0.4 ms
Lead Channel Sensing Intrinsic Amplitude: 14.875 mV
Lead Channel Sensing Intrinsic Amplitude: 3 mV
Lead Channel Setting Pacing Amplitude: 2.5 V
Lead Channel Setting Pacing Amplitude: 2.5 V
Lead Channel Setting Pacing Pulse Width: 0.4 ms
Lead Channel Setting Pacing Pulse Width: 1 ms
Lead Channel Setting Sensing Sensitivity: 0.3 mV
Zone Setting Status: 755011

## 2024-08-06 NOTE — Telephone Encounter (Signed)
 Attempted to contact [name], no answer and unable to leave a voicemail. Per Avelina from [employer], the order was received on 10/21 and processed on 10/22. [employer] will be reaching out to the patient by the end of the week. Nothing further needed at this time.

## 2024-08-07 ENCOUNTER — Other Ambulatory Visit (HOSPITAL_BASED_OUTPATIENT_CLINIC_OR_DEPARTMENT_OTHER): Payer: Self-pay | Admitting: Pulmonary Disease

## 2024-08-07 ENCOUNTER — Telehealth: Payer: Self-pay

## 2024-08-07 LAB — CUP PACEART REMOTE DEVICE CHECK
Battery Remaining Longevity: 3 mo
Battery Voltage: 2.75 V
Brady Statistic AP VP Percent: 0 %
Brady Statistic AP VS Percent: 0 %
Brady Statistic AS VP Percent: 0 %
Brady Statistic AS VS Percent: 0 %
Brady Statistic RA Percent Paced: 0 %
Brady Statistic RV Percent Paced: 99.45 %
Date Time Interrogation Session: 20251021162407
HighPow Impedance: 81 Ohm
Implantable Lead Connection Status: 753985
Implantable Lead Connection Status: 753985
Implantable Lead Implant Date: 20181205
Implantable Lead Implant Date: 20181205
Implantable Lead Location: 753858
Implantable Lead Location: 753860
Implantable Lead Model: 4398
Implantable Pulse Generator Implant Date: 20181205
Lead Channel Impedance Value: 1064 Ohm
Lead Channel Impedance Value: 1254 Ohm
Lead Channel Impedance Value: 1254 Ohm
Lead Channel Impedance Value: 1292 Ohm
Lead Channel Impedance Value: 1311 Ohm
Lead Channel Impedance Value: 284.683
Lead Channel Impedance Value: 329.069
Lead Channel Impedance Value: 332.11 Ohm
Lead Channel Impedance Value: 342.257
Lead Channel Impedance Value: 345.547
Lead Channel Impedance Value: 361 Ohm
Lead Channel Impedance Value: 399 Ohm
Lead Channel Impedance Value: 4047 Ohm
Lead Channel Impedance Value: 551 Ohm
Lead Channel Impedance Value: 589 Ohm
Lead Channel Impedance Value: 817 Ohm
Lead Channel Impedance Value: 836 Ohm
Lead Channel Impedance Value: 950 Ohm
Lead Channel Pacing Threshold Amplitude: 0.375 V
Lead Channel Pacing Threshold Pulse Width: 0.4 ms
Lead Channel Sensing Intrinsic Amplitude: 14.875 mV
Lead Channel Sensing Intrinsic Amplitude: 3 mV
Lead Channel Setting Pacing Amplitude: 2.5 V
Lead Channel Setting Pacing Amplitude: 2.5 V
Lead Channel Setting Pacing Pulse Width: 0.4 ms
Lead Channel Setting Pacing Pulse Width: 1 ms
Lead Channel Setting Sensing Sensitivity: 0.3 mV
Zone Setting Status: 755011

## 2024-08-07 NOTE — Progress Notes (Incomplete)
 COVID Vaccine Completed:  Date of COVID positive in last 90 days:  PCP - Lynwood Rush, MD Cardiologist -  Advanced Heart Failure- Ezra Shuck, MD Electrophysiologist- Elspeth Sage, MD Pulmonary- Chi Slater Staff, MD LOV 07/29/24  Chest x-ray - 05/04/24 Epic EKG - 05/20/24 Epic Stress Test - 05/01/18 Epic ECHO - 11/04/23 Epic Cardiac Cath - 01/28/17 Epic Pacemaker/ICD device last checked: ICD 08/04/24 Epic Spinal Cord Stimulator:N/A  Bowel Prep - N/A  Sleep Study - Positive CPAP - no  Fasting Blood Sugar -  Checks Blood Sugar _____ times a day  Last dose of GLP1 agonist-  Mounjaro  GLP1 instructions:  Do not take after 08/09/24   Last dose of SGLT-2 inhibitors-  N/A SGLT-2 instructions:  Do not take after     Blood Thinner Instructions: Eliquis  Aspirin  Instructions:N/A Last Dose:  Activity level:  Can go up a flight of stairs and perform activities of daily living without stopping and without symptoms of chest pain or shortness of breath.  Able to exercise without symptoms  Unable to go up a flight of stairs without symptoms of     Anesthesia review: RBB, cardiomyopathy, HFrEF, a fib, OSA, COPD, DM2, ICD, anemia, syncope, cont O2  Patient denies shortness of breath, fever, cough and chest pain at PAT appointment  Patient verbalized understanding of instructions that were given to them at the PAT appointment. Patient was also instructed that they will need to review over the PAT instructions again at home before surgery.

## 2024-08-07 NOTE — Telephone Encounter (Signed)
Remote ICM transmission received.  Attempted call to patient regarding ICM remote transmission and no answer.  Mail box is full. 

## 2024-08-07 NOTE — Progress Notes (Signed)
 EPIC Encounter for ICM Monitoring  Patient Name: TRESTAN VAHLE is a 68 y.o. male Date: 08/07/2024 Primary Care Physican: Norleen Lynwood ORN, MD Primary Cardiologist: Rolan Electrophysiologist: Almetta Pore Pacing: 99.5%    11/04/2023 Office Weight: 285 lbs 05/26/2024 Home Weight: 240 lbs (approximately 30 lb loss over the last few months) 06/29/2024 Office Weight: 250 lbs   Battery Longevity: 3 months     Attempted call to patient and unable to reach.  Transmission results reviewed.   Optivol thoracic impedance suggesting norma fluid levels since 05/28/2024 Optivol calibration with the exception of possible fluid level improvement since 07/28/2024-07/31/2024.   Prescribed:  Torsemide  20 mg 4 tablet(s) (80 mg total) by mouth twice a day.  Potassium 20 mEq take 3 tablets (60 mEq total) by mouth daily   Labs: 05/28/2024 Creatinine 1.07, BUN 26, Potassium 4.3, Sodium 139, GFR >60 05/04/2024 Creatinine 0.92, BUN 12, Potassium 3.3, Sodium 138, GFR >60  04/17/2024 Creatinine 1.24, BUN 16, Potassium 3.2, Sodium 134, GFR >60  04/16/2024 Creatinine 0.93, BUN 11, Potassium 3.2, Sodium 136, GFR >60  04/15/2024 Creatinine 1.07, BUN 13, Potassium 3.5, Sodium 136, GFR >60 04/14/2024 Creatinine 1.03, BUN 15, Potassium 3.7, Sodium 137, GFR >60  04/13/2024 Creatinine 1.05, BUN 17, Potassium 4.5, Sodium 133, GFR >60  04/12/2024 Creatinine 1.27, BUN 23, Potassium 4.1, Sodium 134, GFR >60  A complete set of results can be found in Results Review.   Recommendations: Unable to reach.     Follow-up plan: ICM clinic phone appointment on 09/07/2024.   91 day device clinic remote transmission 11/05/2024.   EP/Cardiology Office Visits:    Recall 12/07/2024 with Dr Fernande.   Recall 08/18/2024 with Dr Rolan.    Copy of ICM check sent to Dr. Almetta.   Remote monitoring is medically necessary for Heart Failure Management.    Daily Thoracic Impedance ICM trend: 05/05/2024 through 08/04/2024.    12-14 Month  Thoracic Impedance ICM trend:     Mitzie GORMAN Garner, RN 08/07/2024 1:21 PM

## 2024-08-08 ENCOUNTER — Ambulatory Visit: Payer: Self-pay | Admitting: Student in an Organized Health Care Education/Training Program

## 2024-08-10 NOTE — Progress Notes (Signed)
 Monthly heart failure report shows elevated HF diagnostics over the prior month.   Agree with diuretic regimen per protocol as above.   Continue monthly reports with additional remote or in person follow up as needed.   Ozell Jodie Passey, PA-C

## 2024-08-11 ENCOUNTER — Other Ambulatory Visit: Payer: Self-pay

## 2024-08-11 ENCOUNTER — Other Ambulatory Visit: Payer: Self-pay | Admitting: Internal Medicine

## 2024-08-11 ENCOUNTER — Other Ambulatory Visit: Payer: Self-pay | Admitting: Podiatry

## 2024-08-11 DIAGNOSIS — E1140 Type 2 diabetes mellitus with diabetic neuropathy, unspecified: Secondary | ICD-10-CM

## 2024-08-12 ENCOUNTER — Telehealth: Payer: Self-pay

## 2024-08-12 NOTE — Telephone Encounter (Signed)
 Procedure:EGD Procedure date: 08/17/24 Procedure location: [facility] Arrival Time: 7:00 Spoke with the patient Y/N: N Any prep concerns? N  Has the patient obtained the prep from the pharmacy ? N Do you have a care partner and transportation: N Any additional concerns? N

## 2024-08-13 ENCOUNTER — Telehealth: Payer: Self-pay | Admitting: Podiatry

## 2024-08-13 ENCOUNTER — Encounter: Payer: Self-pay | Admitting: Neurology

## 2024-08-13 ENCOUNTER — Ambulatory Visit: Admitting: Neurology

## 2024-08-13 VITALS — BP 92/45 | Ht 70.0 in | Wt 240.0 lb

## 2024-08-13 DIAGNOSIS — M542 Cervicalgia: Secondary | ICD-10-CM | POA: Diagnosis not present

## 2024-08-13 DIAGNOSIS — G243 Spasmodic torticollis: Secondary | ICD-10-CM | POA: Diagnosis not present

## 2024-08-13 DIAGNOSIS — G8929 Other chronic pain: Secondary | ICD-10-CM

## 2024-08-13 NOTE — Progress Notes (Signed)
 Chief Complaint  Patient presents with   New Patient (Initial Visit)    Pt in room 15. Devere friend in room. Paper referral cervical dystonia, eval for Botox injections   ASSESSMENT AND PLAN  [name] is a 68 y.o. male   Cervical dystonia  Moderate left tilt, altered moderate anterocollis, limited range of motion,  History of C7 facet fracture,  Multiple comorbidity, including atrial fibrillation, on anticoagulation, not a good candidate for botulism toxin injection, suggest physical therapy, pain management   DIAGNOSTIC DATA (LABS, IMAGING, TESTING) - I reviewed patient records, labs, notes, testing and imaging myself where available.   MEDICAL HISTORY:  [name], is a 68 year old male seen in request by his primary care physician Dr.John, Lynwood for evaluation of cervical dystonia, accompanied by his friend Devere at today's visit August 13, 2024  History is obtained from the patient and review of electronic medical records. I personally reviewed pertinent available imaging films in PACS.   PMHx of  COPD, oxygen  dependent Hx of long time heavy smoker,  Depression anxiety A fib--on Eliquis   DM,  DM peripheral neuropathy, right 2nd toe amputation in August 2025. OSA, sleep in recliner, could not tolerate CPAP CAD. Left knee replacement.  He used to be a heavy smoker, severe COPD, oxygen  dependent 24/7, gradual decline in functional status over the past few years, spent most of time in his chair, sleeping in recliner, deconditioned,   He fell on May 14, 2023 bending over to get a bottle of water, face down to concrete floor, suffered acute nondisplaced fracture of superior articular facet of C7, was treated conservatively  CT cervical on May 14 2023,  Acute nondisplaced fracture of the superior articular facet of C7.   CT head: no acute abnormality.   Before that he already have significant neck pain, slow worsening neck tilting to the right shoulder,  difficulty turning, popping sound when moving his neck, his neck pain limited range of motion abnormal posturing, tilting to right shoulder significantly worsened since his C7 fracture, he has to quit driving because difficulty turning,  He is on anticoagulation Eliquis  due to atrial fibrillation, was already evaluated by outside neurologist, do not think he is a good candidate for botulism toxin injection  PHYSICAL EXAM:   Vitals:   08/13/24 1044 08/13/24 1048  BP: (!) 74/42 (!) 92/45  Weight: 240 lb (108.9 kg)   Height: 5' 10 (1.778 m)    Body mass index is 34.44 kg/m.  PHYSICAL EXAMNIATION:  Gen: NAD, conversant, well nourised, well groomed                     Cardiovascular: Regular rate rhythm, no peripheral edema, warm, nontender. Eyes: Conjunctivae clear without exudates or hemorrhage Neck: Moderate acute to right shoulder, limited range of motion, also component of anterocollis Pulmonary: Clear to auscultation bilaterally   NEUROLOGICAL EXAM:  MENTAL STATUS: Speech/cognition: Sitting wheelchair, nasal oxygen , with oxygen  tank, CRANIAL NERVES: CN II: Visual fields are full to confrontation. Pupils are round equal and briskly reactive to light. CN III, IV, VI: extraocular movement are normal. No ptosis. CN V: Facial sensation is intact to light touch CN VII: Face is symmetric with normal eye closure  CN VIII: Hearing is normal to causal conversation. CN IX, X: Phonation is normal. CN XI: Head turning and shoulder shrug are intact  MOTOR: No significant bilateral upper or lower extremity proximal distal muscle weakness  REFLEXES: Hyperreflexia,  SENSORY: Length-dependent sensory  change to knee level  COORDINATION: There is no trunk or limb dysmetria noted.  GAIT/STANCE: Deferred  REVIEW OF SYSTEMS:  Full 14 system review of systems performed and notable only for as above All other review of systems were negative.   ALLERGIES: Allergies  Allergen Reactions    Amiodarone  Other (See Comments)    Caused hyperthyroidism    Statins Other (See Comments)    Myalgias    Sulfa  Antibiotics Other (See Comments)    Mouth ulcers   Wound Dressing Adhesive Other (See Comments)    Skin Tears Use Paper Tape Only    HOME MEDICATIONS: Current Outpatient Medications  Medication Sig Dispense Refill   acetaminophen  (TYLENOL ) 325 MG tablet Take 2 tablets (650 mg total) by mouth every 6 (six) hours as needed for fever or mild pain (pain score 1-3).     albuterol  (VENTOLIN  HFA) 108 (90 Base) MCG/ACT inhaler INHALE 1-2 PUFFS BY MOUTH EVERY 6 HOURS AS NEEDED FOR WHEEZE OR SHORTNESS OF BREATH 8.5 each 5   ALPRAZolam  (XANAX ) 0.5 MG tablet TAKE 2 TABLETS (1 MG TOTAL) BY MOUTH EVERY DAY AT BEDTIME 60 tablet 1   ammonium lactate  (AMLACTIN) 12 % cream Apply 1 Application topically as needed for dry skin. 385 g 3   apixaban  (ELIQUIS ) 5 MG TABS tablet Take 1 tablet (5 mg total) by mouth 2 (two) times daily. (Patient taking differently: Take 5 mg by mouth daily.) 60 tablet 11   ascorbic acid (VITAMIN C) 500 MG tablet Take 500 mg by mouth daily.     augmented betamethasone  dipropionate (DIPROLENE -AF) 0.05 % cream Apply 1 application  topically daily as needed (to affected areas- for itching).     Cholecalciferol  (VITAMIN D3) 50 MCG (2000 UT) TABS Take 2,000 Units by mouth daily.     Cyanocobalamin  (VITAMIN B-12) 2500 MCG SUBL Place 2,500 mcg under the tongue daily.     doxepin  (SINEQUAN ) 10 MG capsule Take 2 capsules (20 mg total) by mouth at bedtime. 180 capsule 1   DULoxetine  (CYMBALTA ) 60 MG capsule Take 1 capsule (60 mg total) by mouth daily. 90 capsule 0   Dupilumab (DUPIXENT) 300 MG/2ML SOPN Inject 300 mg into the skin every 14 (fourteen) days.     eplerenone  (INSPRA ) 25 MG tablet Take 25 mg by mouth daily.     esomeprazole  (NEXIUM ) 40 MG capsule Take 1 capsule (40 mg total) by mouth daily at 12 noon. 30 capsule 2   Evolocumab  (REPATHA  SURECLICK) 140 MG/ML SOAJ INJECT  140 MG INTO THE SKIN EVERY 14 DAYS 6 mL 3   fluticasone  (FLONASE ) 50 MCG/ACT nasal spray Place 2 sprays into both nostrils daily as needed for allergies or rhinitis. 48 mL 1   gabapentin  (NEURONTIN ) 100 MG capsule Take 1 capsule (100 mg total) by mouth at bedtime.     ipratropium-albuterol  (DUONEB) 0.5-2.5 (3) MG/3ML SOLN TAKE 3 ML BY NEBULIZATION EVERY 4 HOURS AS NEEDED (WHEEZING). 360 mL 0   lidocaine  (LIDODERM ) 5 % PLACE 1 PATCH ONTO THE SKIN DAILY. REMOVE & DISCARD PATCH WITHIN 12 HOURS OR AS DIRECTED BY MD 30 patch 2   linaclotide  (LINZESS ) 72 MCG capsule Take 1 capsule (72 mcg total) by mouth daily before breakfast. 90 capsule 3   losartan  (COZAAR ) 25 MG tablet Take 25 mg by mouth daily.     magnesium  hydroxide (MILK OF MAGNESIA) 400 MG/5ML suspension Take 5 mLs by mouth daily as needed for mild constipation. 355 mL 0   meclizine  (ANTIVERT ) 25 MG  tablet Take 1 tablet (25 mg total) by mouth 3 (three) times daily as needed for dizziness. 30 tablet 0   Multiple Vitamins-Minerals (SENIOR VITES PO) Take 1 tablet by mouth daily with breakfast.     omeprazole  (PRILOSEC) 20 MG capsule TAKE 1 CAPSULE BY MOUTH TWICE A DAY 180 capsule 1   ondansetron  (ZOFRAN -ODT) 4 MG disintegrating tablet TAKE 1 TABLET BY MOUTH EVERY 8 HOURS AS NEEDED 30 tablet 1   oxyCODONE  (ROXICODONE ) 5 MG immediate release tablet Take 1 tablet (5 mg total) by mouth every 6 (six) hours as needed for severe pain (pain score 7-10). 30 tablet 0   oxyCODONE -acetaminophen  (PERCOCET) 5-325 MG tablet Take 1 tablet by mouth every 6 (six) hours as needed for severe pain (pain score 7-10). 20 tablet 0   potassium chloride  SA (KLOR-CON  M) 20 MEQ tablet Take 60 mEq by mouth daily.     predniSONE  (DELTASONE ) 10 MG tablet TAKE 1 TABLET (10 MG TOTAL) BY MOUTH DAILY WITH BREAKFAST. 90 tablet 0   predniSONE  (DELTASONE ) 10 MG tablet 3 tabs by mouth per day for 3 days,2tabs per day for 3 days,1tab per day for 3 days 18 tablet 0   tamsulosin  (FLOMAX )  0.4 MG CAPS capsule Take 1 capsule (0.4 mg total) by mouth daily. 30 capsule 5   Tiotropium Bromide  Monohydrate (SPIRIVA  RESPIMAT) 2.5 MCG/ACT AERS Take 2 puffs by nebulization daily. 4 g 3   tirzepatide  (MOUNJARO ) 7.5 MG/0.5ML Pen Inject 7.5 mg into the skin once a week. 6 mL 3   tiZANidine  (ZANAFLEX ) 4 MG tablet TAKE 1 TABLET BY MOUTH 3 TIMES DAILY. 270 tablet 2   torsemide  (DEMADEX ) 20 MG tablet Take 4 tablets (80 mg total) by mouth 2 (two) times daily. 720 tablet 2   traMADol  (ULTRAM ) 50 MG tablet Take 1 tablet (50 mg total) by mouth 3 (three) times daily. 90 tablet 5   traZODone  (DESYREL ) 100 MG tablet TAKE 2 TABLETS BY MOUTH AT BEDTIME AS NEEDED (Patient taking differently: Take 200 mg by mouth at bedtime.) 180 tablet 1   TUMS 500 MG chewable tablet Chew 1 tablet by mouth 2 (two) times daily as needed for indigestion or heartburn.     valACYclovir  (VALTREX ) 1000 MG tablet Take 1,000 mg by mouth 3 (three) times daily as needed (Outbreaks).     venlafaxine  XR (EFFEXOR -XR) 150 MG 24 hr capsule TAKE 1 CAPSULE BY MOUTH DAILY WITH BREAKFAST. 90 capsule 3   WIXELA INHUB 250-50 MCG/ACT AEPB INHALE 1 PUFF INTO THE LUNGS IN THE MORNING AND AT BEDTIME. 60 each 8   zolpidem  (AMBIEN ) 5 MG tablet Take 5 mg by mouth at bedtime.     No current facility-administered medications for this visit.    PAST MEDICAL HISTORY: Past Medical History:  Diagnosis Date   AICD (automatic cardioverter/defibrillator) present    Anemia    supposed to be taking Vit B but doesn't   ANXIETY    takes Xanax  nightly   Arthritis    Asthma    Albuterol  prn and Advair  daily;also takes Prednisone  daily   Cardiomyopathy [facility])    a. EF 25% TEE July 2013; b. EF normalized 2015;  c. 03/2015 Echo: EF 40-45%, difrf HK, PASP 38 mmHg, Mild MR, sev LAE/RAE.   Chronic constipation    takes OTC stool softener   COPD (chronic obstructive pulmonary disease) (HCC)    one dr says COPD; one dr says emphysema (09/18/2017)   COVID-19  12/09/2019   DEPRESSION  takes Zoloft  and Doxepin  daily   Diverticulitis    DYSKINESIA, ESOPHAGUS    Essential hypertension        FIBROMYALGIA    GERD (gastroesophageal reflux disease)        Glaucoma    HYPERLIPIDEMIA    a. Intolerant to statins.   INSOMNIA    takes Ambien  nightly   Myocardial infarction [facility])    a. 2012 Myoview  notable for prior infarct;  b. 03/2015 Lexiscan  CL: EF 37%, diff HK, small area of inferior infarct from apex to base-->Med Rx.   O2 dependent    2.5L q hs & prn (09/18/2017)   Paroxysmal atrial fibrillation (HCC)    a. CHA2DS2VASc = 3--> takes Coumadin ;  b. 03/15/2015 Successful TEE/DCCV;  c. 03/2015 recurrent afib, Amio d/c'd in setting of hyperthyroidism.   Peripheral neuropathy    Pneumonia 12/2016   Rash and other nonspecific skin eruption 04/12/2009   no cause found saw dermatologists x 2 and allergist   SLEEP APNEA, OBSTRUCTIVE    a. doesn't use CPAP   Syncope    a. 03/2015 s/p MDT LINQ.   Type II diabetes mellitus (HCC)         PAST SURGICAL HISTORY: Past Surgical History:  Procedure Laterality Date   ACNE CYST REMOVAL     2 on back    AMPUTATION TOE Right 06/01/2024   Procedure: AMPUTATION, TOE;  Surgeon: Janit Thresa HERO, DPM;  Location: [facility] OR;  Service: Orthopedics/Podiatry;  Laterality: Right;  RIGHT 2ND TOE IV SEDATION   AV NODE ABLATION N/A 10/25/2017   Procedure: AV NODE ABLATION;  Surgeon: Fernande Elspeth BROCKS, MD;  Location: [facility] INVASIVE CV LAB;  Service: Cardiovascular;  Laterality: N/A;   BIV ICD INSERTION CRT-D N/A 09/18/2017   Procedure: BIV ICD INSERTION CRT-D;  Surgeon: Fernande Elspeth BROCKS, MD;  Location: [facility] INVASIVE CV LAB;  Service: Cardiovascular;  Laterality: N/A;   CARDIAC CATHETERIZATION N/A 03/21/2016   Procedure: Right/Left Heart Cath and Coronary Angiography;  Surgeon: Ezra GORMAN Shuck, MD;  Location: [facility] INVASIVE CV LAB;  Service: Cardiovascular;  Laterality: N/A;   CARDIOVERSION  04/18/2012   Procedure: CARDIOVERSION;  Surgeon:  Vina LULLA Gull, MD;  Location: [facility] OR;  Service: Cardiovascular;  Laterality: N/A;   CARDIOVERSION  04/25/2012   Procedure: CARDIOVERSION;  Surgeon: Aleene JINNY Passe, MD;  Location: [facility] ENDOSCOPY;  Service: Cardiovascular;  Laterality: N/A;   CARDIOVERSION  04/25/2012   Procedure: CARDIOVERSION;  Surgeon: Vina LULLA Gull, MD;  Location: [facility] OR;  Service: Cardiovascular;  Laterality: N/A;   CARDIOVERSION  05/09/2012   Procedure: CARDIOVERSION;  Surgeon: Ozell Fell, MD;  Location: [facility] OR;  Service: Cardiovascular;  Laterality: N/A;  changed from crenshaw to cooper by trish/leone-endo   CARDIOVERSION N/A 03/15/2015   Procedure: CARDIOVERSION;  Surgeon: Aleene JINNY Passe, MD;  Location: [facility] ENDOSCOPY;  Service: Cardiovascular;  Laterality: N/A;   COLONOSCOPY     COLONOSCOPY WITH PROPOFOL  N/A 10/21/2014   Procedure: COLONOSCOPY WITH PROPOFOL ;  Surgeon: Gwendlyn ONEIDA Buddy, MD;  Location: [facility] ENDOSCOPY;  Service: Endoscopy;  Laterality: N/A;   EP IMPLANTABLE DEVICE N/A 04/06/2015   Procedure: Loop Recorder Insertion;  Surgeon: Danelle LELON Birmingham, MD;  Location: [facility] INVASIVE CV LAB;  Service: Cardiovascular;  Laterality: N/A;   ESOPHAGOGASTRODUODENOSCOPY     JOINT REPLACEMENT     LOOP RECORDER REMOVAL N/A 09/18/2017   Procedure: LOOP RECORDER REMOVAL;  Surgeon: Fernande Elspeth BROCKS, MD;  Location: [facility] INVASIVE CV LAB;  Service: Cardiovascular;  Laterality: N/A;  RIGHT/LEFT HEART CATH AND CORONARY ANGIOGRAPHY N/A 01/28/2017   Procedure: Right/Left Heart Cath and Coronary Angiography;  Surgeon: Ezra GORMAN Shuck, MD;  Location: [facility] INVASIVE CV LAB;  Service: Cardiovascular;  Laterality: N/A;   TEE WITHOUT CARDIOVERSION  04/25/2012   Procedure: TRANSESOPHAGEAL ECHOCARDIOGRAM (TEE);  Surgeon: Aleene JINNY Passe, MD;  Location: [facility] ENDOSCOPY;  Service: Cardiovascular;  Laterality: N/A;   TEE WITHOUT CARDIOVERSION N/A 03/15/2015   Procedure: TRANSESOPHAGEAL ECHOCARDIOGRAM (TEE);  Surgeon: Aleene JINNY Passe, MD;  Location: [facility] ENDOSCOPY;  Service:  Cardiovascular;  Laterality: N/A;   TONSILLECTOMY AND ADENOIDECTOMY     TOTAL KNEE ARTHROPLASTY Right 06/15/2014   Procedure: TOTAL KNEE ARTHROPLASTY;  Surgeon: Evalene JONETTA Chancy, MD;  Location: [facility] OR;  Service: Orthopedics;  Laterality: Right;   TOTAL KNEE ARTHROPLASTY Left 10/17/2021   Procedure: Left TOTAL KNEE ARTHROPLASTY;  Surgeon: Vernetta Lonni GRADE, MD;  Location: [facility] OR;  Service: Orthopedics;  Laterality: Left;    FAMILY HISTORY: Family History  Problem Relation Age of Onset   COPD Mother    Asthma Mother    Colon polyps Mother    Allergies Mother    Hypothyroidism Mother    Asthma Maternal Grandmother    Colon cancer Neg Hx     SOCIAL HISTORY: Social History   Socioeconomic History   Marital status: Divorced    Spouse name: Not on file   Number of children: 2   Years of education: Not on file   Highest education level: GED or equivalent  Occupational History   Occupation: retired/disabled. prev worked in teacher, english as a foreign language.    Employer: DISABLED  Tobacco Use   Smoking status: Former    Current packs/day: 0.00    Average packs/day: 2.0 packs/day for 30.0 years (60.0 ttl pk-yrs)    Types: Cigarettes    Start date: 10/15/1977    Quit date: 10/16/2007    Years since quitting: 16.8   Smokeless tobacco: Never  Vaping Use   Vaping status: Never Used  Substance and Sexual Activity   Alcohol use: No   Drug use: No   Sexual activity: Not Currently  Other Topics Concern   Not on file  Social History Narrative   Lives with girlfriend   Social Drivers of Health   Financial Resource Strain: Medium Risk (01/01/2024)   Overall Financial Resource Strain (CARDIA)    Difficulty of Paying Living Expenses: Somewhat hard  Food Insecurity: No Food Insecurity (04/13/2024)   Hunger Vital Sign    Worried About Running Out of Food in the Last Year: Never true    Ran Out of Food in the Last Year: Never true  Transportation Needs: No Transportation Needs (04/13/2024)   PRAPARE -  Administrator, Civil Service (Medical): No    Lack of Transportation (Non-Medical): No  Physical Activity: Inactive (01/01/2024)   Exercise Vital Sign    Days of Exercise per Week: 0 days    Minutes of Exercise per Session: 0 min  Stress: No Stress Concern Present (01/01/2024)   [employer] of Occupational Health - Occupational Stress Questionnaire    Feeling of Stress : Only a little  Social Connections: Socially Isolated (04/13/2024)   Social Connection and Isolation Panel    Frequency of Communication with Friends and Family: Once a week    Frequency of Social Gatherings with Friends and Family: Never    Attends Religious Services: Never    Database Administrator or Organizations: No    Attends Banker Meetings:  Never    Marital Status: Divorced  Catering Manager Violence: Not At Risk (04/13/2024)   Humiliation, Afraid, Rape, and Kick questionnaire    Fear of Current or Ex-Partner: No    Emotionally Abused: No    Physically Abused: No    Sexually Abused: No      Modena Callander, M.D. Ph.D.  [facility] [address], [address] [place], [place] [address] Ph: [phone] Fax: [phone]  CC:  Orlean Suzen Lacks, NP [address] [place],  [place] [address]  Norleen Lynwood ORN, MD

## 2024-08-13 NOTE — Telephone Encounter (Signed)
 Patient states when he wakes up in the morning there is dried blood on the toe and he is having some pain. Is this normal? Can he get a refill of oxycodone ?

## 2024-08-14 LAB — CUP PACEART REMOTE DEVICE CHECK
Battery Remaining Longevity: 3 mo
Battery Voltage: 2.75 V
Brady Statistic AP VP Percent: 0 %
Brady Statistic AP VS Percent: 0 %
Brady Statistic AS VP Percent: 0 %
Brady Statistic AS VS Percent: 0 %
Brady Statistic RA Percent Paced: 0 %
Brady Statistic RV Percent Paced: 99.45 %
Date Time Interrogation Session: 20251021162407
HighPow Impedance: 81 Ohm
Implantable Lead Connection Status: 753985
Implantable Lead Connection Status: 753985
Implantable Lead Implant Date: 20181205
Implantable Lead Implant Date: 20181205
Implantable Lead Location: 753858
Implantable Lead Location: 753860
Implantable Lead Model: 4398
Implantable Pulse Generator Implant Date: 20181205
Lead Channel Impedance Value: 1064 Ohm
Lead Channel Impedance Value: 1254 Ohm
Lead Channel Impedance Value: 1254 Ohm
Lead Channel Impedance Value: 1292 Ohm
Lead Channel Impedance Value: 1311 Ohm
Lead Channel Impedance Value: 284.683
Lead Channel Impedance Value: 329.069
Lead Channel Impedance Value: 332.11 Ohm
Lead Channel Impedance Value: 342.257
Lead Channel Impedance Value: 345.547
Lead Channel Impedance Value: 361 Ohm
Lead Channel Impedance Value: 399 Ohm
Lead Channel Impedance Value: 4047 Ohm
Lead Channel Impedance Value: 551 Ohm
Lead Channel Impedance Value: 589 Ohm
Lead Channel Impedance Value: 817 Ohm
Lead Channel Impedance Value: 836 Ohm
Lead Channel Impedance Value: 950 Ohm
Lead Channel Pacing Threshold Amplitude: 0.375 V
Lead Channel Pacing Threshold Pulse Width: 0.4 ms
Lead Channel Sensing Intrinsic Amplitude: 14.875 mV
Lead Channel Sensing Intrinsic Amplitude: 3 mV
Lead Channel Setting Pacing Amplitude: 2.5 V
Lead Channel Setting Pacing Amplitude: 2.5 V
Lead Channel Setting Pacing Pulse Width: 0.4 ms
Lead Channel Setting Pacing Pulse Width: 1 ms
Lead Channel Setting Sensing Sensitivity: 0.3 mV
Zone Setting Status: 755011

## 2024-08-17 ENCOUNTER — Other Ambulatory Visit: Payer: Self-pay

## 2024-08-17 ENCOUNTER — Encounter (HOSPITAL_COMMUNITY)
Admission: RE | Disposition: A | Discharge status: Home / Self Care | Payer: Self-pay | Source: Home / Self Care | Attending: Gastroenterology

## 2024-08-17 ENCOUNTER — Telehealth: Payer: Self-pay | Admitting: Gastroenterology

## 2024-08-17 ENCOUNTER — Encounter (HOSPITAL_COMMUNITY): Payer: Self-pay | Admitting: Gastroenterology

## 2024-08-17 ENCOUNTER — Ambulatory Visit: Admitting: Podiatry

## 2024-08-17 ENCOUNTER — Ambulatory Visit (HOSPITAL_COMMUNITY)

## 2024-08-17 ENCOUNTER — Ambulatory Visit (HOSPITAL_COMMUNITY)
Admission: RE | Admit: 2024-08-17 | Discharge: 2024-08-17 | Disposition: A | Discharge status: Home / Self Care | Attending: Gastroenterology | Admitting: Gastroenterology

## 2024-08-17 DIAGNOSIS — Z01818 Encounter for other preprocedural examination: Secondary | ICD-10-CM | POA: Insufficient documentation

## 2024-08-17 DIAGNOSIS — Z538 Procedure and treatment not carried out for other reasons: Secondary | ICD-10-CM | POA: Diagnosis not present

## 2024-08-17 HISTORY — DX: Presence of cardiac pacemaker: Z95.0

## 2024-08-17 LAB — GLUCOSE, CAPILLARY: Glucose-Capillary: 121 mg/dL — ABNORMAL HIGH (ref 70–99)

## 2024-08-17 SURGERY — CANCELLED PROCEDURE
Anesthesia: Monitor Anesthesia Care

## 2024-08-17 NOTE — Anesthesia Preprocedure Evaluation (Addendum)
 Anesthesia Evaluation  Patient identified by MRN, date of birth, ID band Patient awake    Reviewed: Allergy  & Precautions, NPO status , Patient's Chart, lab work & pertinent test results, reviewed documented beta blocker date and time   History of Anesthesia Complications Negative for: history of anesthetic complications  Airway Mallampati: III  TM Distance: >3 FB     Dental no notable dental hx.    Pulmonary asthma , sleep apnea , pneumonia, unresolved, COPD, former smoker   breath sounds clear to auscultation       Cardiovascular Exercise Tolerance: Poor hypertension, + Past MI and +CHF  + dysrhythmias + pacemaker + Cardiac Defibrillator  Rhythm:Regular Rate:Normal  IMPRESSIONS     1. Left ventricular ejection fraction, by estimation, is 40 to 45%. The  left ventricle has mildly decreased function. The left ventricular  internal cavity size was mildly dilated. Left ventricular diastolic  parameters are indeterminate.   2. Right ventricular systolic function is mildly reduced. The right  ventricular size is normal. There is normal pulmonary artery systolic  pressure.   3. Left atrial size was moderately dilated.   4. The mitral valve is normal in structure. Trivial mitral valve  regurgitation.   5. AV is thickened, calcified. Peak and mean gradients through the valve  are 15 and 8 mm Hg respectively. [name] (VTI) is 1.85 cm2 Consistent with  mild aortic stenosis. . The aortic valve is tricuspid. Aortic valve  regurgitation is not visualized. Aortic  valve sclerosis/calcification is present, without any evidence of aortic  stenosis.     Neuro/Psych neg Seizures PSYCHIATRIC DISORDERS Anxiety Depression     Neuromuscular disease    GI/Hepatic ,GERD  ,,(+) neg Cirrhosis        Endo/Other  diabetes, Type 2    Renal/GU Renal disease     Musculoskeletal  (+) Arthritis , Osteoarthritis,    Abdominal   Peds   Hematology  (+) Blood dyscrasia, anemia   Anesthesia Other Findings   Reproductive/Obstetrics                              Anesthesia Physical Anesthesia Plan  ASA: 3  Anesthesia Plan: MAC   Post-op Pain Management:    Induction: Intravenous  PONV Risk Score and Plan: 2 and Ondansetron  and Propofol  infusion  Airway Management Planned: Natural Airway and Nasal Cannula  Additional Equipment:   Intra-op Plan:   Post-operative Plan:   Informed Consent: I have reviewed the patients History and Physical, chart, labs and discussed the procedure including the risks, benefits and alternatives for the proposed anesthesia with the patient or authorized representative who has indicated his/her understanding and acceptance.     Dental advisory given  Plan Discussed with: CRNA  Anesthesia Plan Comments: (Did not hold mounjaro . Discussed with patient. He is in the maintenance phase of treatment and has no symptoms of fullness, nausea, etc. Otherwise appropriately NPO. Risk and benefits of proceeding with moderate/deep sedation discussed with patient; we will proceed with MAC but plan to convert to GETA if endoscopy reveals significant stomach contents. )         Anesthesia Quick Evaluation

## 2024-08-17 NOTE — Progress Notes (Signed)
 Patient admitted to endoscopy. Per patient did not received instructions for procedure prior to arrival.  Patient had GLP1 on Friday October 31st and Eliquis  on Sunday, November 2nd.  After discussion with anesthesia and Dr. Charlanne, decision to reschedule case for another day was made.  Patient in agreement plan. Case cancelled for today

## 2024-08-17 NOTE — Telephone Encounter (Signed)
 He was at [facility] for EGD Unfortunately took Eliquis . EGD was canceled.   Plan: - Please reschedule EGD off Eliquis  at [facility] - Also would need colonoscopy set up at [facility].  Please see previous note/colonoscopy report  RG

## 2024-08-18 ENCOUNTER — Telehealth: Payer: Self-pay | Admitting: Gastroenterology

## 2024-08-18 NOTE — Telephone Encounter (Signed)
   Discussed with patient this morning over the phone.  EGD was canceled since patient on Eliquis  that was not held As transportation issues He would like to have EGD/colonoscopy at [facility] at the same time   Plan; - Please set him up for EGD and colonoscopy at the same time at [facility] in [place].  Please asked them to look at previous colonoscopy report at [facility].  Likely would need 1 hour spot  RG

## 2024-08-18 NOTE — Telephone Encounter (Signed)
 See 11/4 TE for details.

## 2024-08-19 ENCOUNTER — Encounter: Payer: Self-pay | Admitting: Podiatry

## 2024-08-19 ENCOUNTER — Ambulatory Visit (INDEPENDENT_AMBULATORY_CARE_PROVIDER_SITE_OTHER)

## 2024-08-19 ENCOUNTER — Ambulatory Visit (INDEPENDENT_AMBULATORY_CARE_PROVIDER_SITE_OTHER): Admitting: Podiatry

## 2024-08-19 ENCOUNTER — Other Ambulatory Visit: Payer: Self-pay | Admitting: Internal Medicine

## 2024-08-19 DIAGNOSIS — L97512 Non-pressure chronic ulcer of other part of right foot with fat layer exposed: Secondary | ICD-10-CM

## 2024-08-19 NOTE — Progress Notes (Signed)
 Chief Complaint  Patient presents with   Nail Problem    Pt is here for [facility].    Subjective:  Patient presents today status post right second toe amputation.  DOS: 06/01/2024.  Patient has noticed that there was dehiscence to the amputation site since last visit.  He has been applying Silvadene  cream and a Band-Aid.  Currently minimally ambulatory  Past Medical History:  Diagnosis Date   AICD (automatic cardioverter/defibrillator) present    Anemia    supposed to be taking Vit B but doesn't   ANXIETY    takes Xanax  nightly   Arthritis    Asthma    Albuterol  prn and Advair  daily;also takes Prednisone  daily   Cardiomyopathy [facility])    a. EF 25% TEE July 2013; b. EF normalized 2015;  c. 03/2015 Echo: EF 40-45%, difrf HK, PASP 38 mmHg, Mild MR, sev LAE/RAE.   Chronic constipation    takes OTC stool softener   COPD (chronic obstructive pulmonary disease) (HCC)    one dr says COPD; one dr says emphysema (09/18/2017)   COVID-19 12/09/2019   DEPRESSION    takes Zoloft  and Doxepin  daily   Diverticulitis    DYSKINESIA, ESOPHAGUS    Essential hypertension        FIBROMYALGIA    GERD (gastroesophageal reflux disease)        Glaucoma    HYPERLIPIDEMIA    a. Intolerant to statins.   INSOMNIA    takes Ambien  nightly   Myocardial infarction [facility])    a. 2012 Myoview  notable for prior infarct;  b. 03/2015 Lexiscan  CL: EF 37%, diff HK, small area of inferior infarct from apex to base-->Med Rx.   O2 dependent    2.5L q hs & prn (09/18/2017)   Paroxysmal atrial fibrillation (HCC)    a. CHA2DS2VASc = 3--> takes Coumadin ;  b. 03/15/2015 Successful TEE/DCCV;  c. 03/2015 recurrent afib, Amio d/c'd in setting of hyperthyroidism.   Peripheral neuropathy    Pneumonia 12/2016   Presence of permanent cardiac pacemaker    Rash and other nonspecific skin eruption 04/12/2009   no cause found saw dermatologists x 2 and allergist   SLEEP APNEA, OBSTRUCTIVE    a. doesn't use CPAP   Syncope    a.  03/2015 s/p MDT LINQ.   Type II diabetes mellitus (HCC)         Past Surgical History:  Procedure Laterality Date   ACNE CYST REMOVAL     2 on back    AMPUTATION TOE Right 06/01/2024   Procedure: AMPUTATION, TOE;  Surgeon: Janit Thresa HERO, DPM;  Location: [facility] OR;  Service: Orthopedics/Podiatry;  Laterality: Right;  RIGHT 2ND TOE IV SEDATION   AV NODE ABLATION N/A 10/25/2017   Procedure: AV NODE ABLATION;  Surgeon: Fernande Elspeth BROCKS, MD;  Location[facility] INVASIVE CV LAB;  Service: Cardiovascular;  Laterality: N/A;   BIV ICD INSERTION CRT-D N/A 09/18/2017   Procedure: BIV ICD INSERTION CRT-D;  Surgeon: Fernande Elspeth BROCKS, MD;  Location: [facility] INVASIVE CV LAB;  Service: Cardiovascular;  Laterality: N/A;   CARDIAC CATHETERIZATION N/A 03/21/2016   Procedure: Right/Left Heart Cath and Coronary Angiography;  Surgeon: Ezra GORMAN Shuck, MD;  Location: [facility] INVASIVE CV LAB;  Service: Cardiovascular;  Laterality: N/A;   CARDIOVERSION  04/18/2012   Procedure: CARDIOVERSION;  Surgeon: Vina LULLA Gull, MD;  Location: [facility] OR;  Service: Cardiovascular;  Laterality: N/A;   CARDIOVERSION  04/25/2012   Procedure: CARDIOVERSION;  Surgeon: Aleene JINNY Passe, MD;  Location:  [facility] ENDOSCOPY;  Service: Cardiovascular;  Laterality: N/A;   CARDIOVERSION  04/25/2012   Procedure: CARDIOVERSION;  Surgeon: Vina LULLA Gull, MD;  Location: [facility] OR;  Service: Cardiovascular;  Laterality: N/A;   CARDIOVERSION  05/09/2012   Procedure: CARDIOVERSION;  Surgeon: Ozell Fell, MD;  Location: [facility] OR;  Service: Cardiovascular;  Laterality: N/A;  changed from crenshaw to cooper by trish/leone-endo   CARDIOVERSION N/A 03/15/2015   Procedure: CARDIOVERSION;  Surgeon: Aleene JINNY Passe, MD;  Location: [facility] ENDOSCOPY;  Service: Cardiovascular;  Laterality: N/A;   COLONOSCOPY     COLONOSCOPY WITH PROPOFOL  N/A 10/21/2014   Procedure: COLONOSCOPY WITH PROPOFOL ;  Surgeon: Gwendlyn ONEIDA Buddy, MD;  Location: [facility] ENDOSCOPY;  Service: Endoscopy;  Laterality: N/A;   EP IMPLANTABLE DEVICE  N/A 04/06/2015   Procedure: Loop Recorder Insertion;  Surgeon: Danelle LELON Birmingham, MD;  Location: [facility] INVASIVE CV LAB;  Service: Cardiovascular;  Laterality: N/A;   ESOPHAGOGASTRODUODENOSCOPY     JOINT REPLACEMENT     LOOP RECORDER REMOVAL N/A 09/18/2017   Procedure: LOOP RECORDER REMOVAL;  Surgeon: Fernande Elspeth BROCKS, MD;  Location: [facility] INVASIVE CV LAB;  Service: Cardiovascular;  Laterality: N/A;   RIGHT/LEFT HEART CATH AND CORONARY ANGIOGRAPHY N/A 01/28/2017   Procedure: Right/Left Heart Cath and Coronary Angiography;  Surgeon: Ezra GORMAN Shuck, MD;  Location: [facility] INVASIVE CV LAB;  Service: Cardiovascular;  Laterality: N/A;   TEE WITHOUT CARDIOVERSION  04/25/2012   Procedure: TRANSESOPHAGEAL ECHOCARDIOGRAM (TEE);  Surgeon: Aleene JINNY Passe, MD;  Location: [facility] ENDOSCOPY;  Service: Cardiovascular;  Laterality: N/A;   TEE WITHOUT CARDIOVERSION N/A 03/15/2015   Procedure: TRANSESOPHAGEAL ECHOCARDIOGRAM (TEE);  Surgeon: Aleene JINNY Passe, MD;  Location: [facility] ENDOSCOPY;  Service: Cardiovascular;  Laterality: N/A;   TONSILLECTOMY AND ADENOIDECTOMY     TOTAL KNEE ARTHROPLASTY Right 06/15/2014   Procedure: TOTAL KNEE ARTHROPLASTY;  Surgeon: Evalene JONETTA Chancy, MD;  Location: [facility] OR;  Service: Orthopedics;  Laterality: Right;   TOTAL KNEE ARTHROPLASTY Left 10/17/2021   Procedure: Left TOTAL KNEE ARTHROPLASTY;  Surgeon: Vernetta Lonni GRADE, MD;  Location: [facility] OR;  Service: Orthopedics;  Laterality: Left;    Allergies  Allergen Reactions   Amiodarone  Other (See Comments)    Caused hyperthyroidism    Statins Other (See Comments)    Myalgias    Sulfa  Antibiotics Other (See Comments)    Mouth ulcers   Wound Dressing Adhesive Other (See Comments)    Skin Tears Use Paper Tape Only    RT foot 08/19/2024  Objective/Physical Exam Neurovascular status intact.  Ulcer noted secondary to dehiscence at the amputation stump of the right second toe.  Fibrotic wound base.  No malodor.  Serous drainage.  No purulence.  Radiographic  Exam RT foot 08/19/2024:  Unchanged from prior x-rays.  Interval amputation of the second digit at the level of the MTP.  No osseous erosions concerning for underlying osteomyelitis  Assessment: 1. s/p right second toe amputation. DOS: 06/10/2024 2.  Ulcer/dehiscence of amputation site second digit right foot   Plan of Care:  - Continue Silvadene  cream with a Band-Aid daily -Patient is well-established at the [facility].  I do believe he would benefit from resuming at the wound care center to see if they can get the wound to heal -Referral placed [facility] -Return to clinic with me PRN   Thresa EMERSON Sar, DPM [facility]  Dr. Thresa EMERSON Sar, DPM    [address]  [place], [place] [address]                Office [phone]  Fax [phone]

## 2024-08-19 NOTE — Progress Notes (Signed)
 Monthly heart failure report shows elevated HF diagnostics over the prior month, likely due to mis-calibration in setting of weight loss.   Agree with plans to re-calibrate.   Continue monthly reports with additional remote or in person follow up as needed.   Ozell Jodie Passey, PA-C

## 2024-08-19 NOTE — Addendum Note (Signed)
 Addended by: NICHOLAUS MARJORIE SAILOR on: 08/19/2024 02:24 PM   Modules accepted: Orders

## 2024-08-19 NOTE — Progress Notes (Signed)
 Remote ICD Transmission

## 2024-08-20 ENCOUNTER — Other Ambulatory Visit (HOSPITAL_COMMUNITY): Payer: Self-pay | Admitting: Cardiology

## 2024-08-20 ENCOUNTER — Other Ambulatory Visit: Payer: Self-pay | Admitting: Internal Medicine

## 2024-08-20 ENCOUNTER — Telehealth: Payer: Self-pay | Admitting: Lab

## 2024-08-20 DIAGNOSIS — I4819 Other persistent atrial fibrillation: Secondary | ICD-10-CM

## 2024-08-20 DIAGNOSIS — I428 Other cardiomyopathies: Secondary | ICD-10-CM

## 2024-08-20 DIAGNOSIS — I255 Ischemic cardiomyopathy: Secondary | ICD-10-CM

## 2024-08-20 MED ORDER — APIXABAN 5 MG PO TABS: 5.0000 mg | ORAL_TABLET | Freq: Two times a day (BID) | ORAL | 11 refills | Status: AC

## 2024-08-20 NOTE — Telephone Encounter (Signed)
 Patient states had amputation and was seen in office 08/19/2024 forgot to ask you for pain medication please advise he states he is going to get area cleaned and needs pain medication.

## 2024-08-20 NOTE — Telephone Encounter (Signed)
 Copied from CRM [id number]. Topic: Clinical - Medication Refill >> Aug 20, 2024 11:07 AM Carlyon D wrote: Medication: ALPRAZolam  (XANAX ) 0.5 MG tablet  Has the patient contacted their pharmacy? Yes (Agent: If no, request that the patient contact the pharmacy for the refill. If patient does not wish to contact the pharmacy document the reason why and proceed with request.) (Agent: If yes, when and what did the pharmacy advise?)  This is the patient's preferred pharmacy:  [id number] [address], [place] - [address] [place] [address] Phone: [phone] Fax: [phone]   Is this the correct pharmacy for this prescription? Yes If no, delete pharmacy and type the correct one.   Has the prescription been filled recently? No  Is the patient out of the medication? No, will be tomorrow   Has the patient been seen for an appointment in the last year OR does the patient have an upcoming appointment? Yes  Can we respond through MyChart? No, prefers phone call.    Agent: Please be advised that Rx refills may take up to 3 business days. We ask that you follow-up with your pharmacy.

## 2024-08-21 ENCOUNTER — Other Ambulatory Visit (HOSPITAL_COMMUNITY): Payer: Self-pay

## 2024-08-21 ENCOUNTER — Other Ambulatory Visit: Payer: Self-pay | Admitting: Internal Medicine

## 2024-08-21 DIAGNOSIS — I130 Hypertensive heart and chronic kidney disease with heart failure and stage 1 through stage 4 chronic kidney disease, or unspecified chronic kidney disease: Secondary | ICD-10-CM | POA: Diagnosis not present

## 2024-08-21 DIAGNOSIS — I5020 Unspecified systolic (congestive) heart failure: Secondary | ICD-10-CM

## 2024-08-21 DIAGNOSIS — F4320 Adjustment disorder, unspecified: Secondary | ICD-10-CM | POA: Diagnosis not present

## 2024-08-21 DIAGNOSIS — D84821 Immunodeficiency due to drugs: Secondary | ICD-10-CM | POA: Diagnosis not present

## 2024-08-21 DIAGNOSIS — J438 Other emphysema: Secondary | ICD-10-CM

## 2024-08-21 DIAGNOSIS — E1165 Type 2 diabetes mellitus with hyperglycemia: Secondary | ICD-10-CM

## 2024-08-21 DIAGNOSIS — E876 Hypokalemia: Secondary | ICD-10-CM | POA: Diagnosis not present

## 2024-08-21 DIAGNOSIS — E2740 Unspecified adrenocortical insufficiency: Secondary | ICD-10-CM | POA: Diagnosis not present

## 2024-08-21 DIAGNOSIS — I4891 Unspecified atrial fibrillation: Secondary | ICD-10-CM | POA: Diagnosis not present

## 2024-08-21 DIAGNOSIS — M869 Osteomyelitis, unspecified: Secondary | ICD-10-CM | POA: Diagnosis not present

## 2024-08-21 DIAGNOSIS — J439 Emphysema, unspecified: Secondary | ICD-10-CM | POA: Diagnosis not present

## 2024-08-21 DIAGNOSIS — I872 Venous insufficiency (chronic) (peripheral): Secondary | ICD-10-CM | POA: Diagnosis not present

## 2024-08-21 DIAGNOSIS — I252 Old myocardial infarction: Secondary | ICD-10-CM | POA: Diagnosis not present

## 2024-08-21 DIAGNOSIS — E11620 Type 2 diabetes mellitus with diabetic dermatitis: Secondary | ICD-10-CM | POA: Diagnosis not present

## 2024-08-21 DIAGNOSIS — E0590 Thyrotoxicosis, unspecified without thyrotoxic crisis or storm: Secondary | ICD-10-CM | POA: Diagnosis not present

## 2024-08-21 DIAGNOSIS — Z9981 Dependence on supplemental oxygen: Secondary | ICD-10-CM | POA: Diagnosis not present

## 2024-08-21 DIAGNOSIS — I2510 Atherosclerotic heart disease of native coronary artery without angina pectoris: Secondary | ICD-10-CM | POA: Diagnosis not present

## 2024-08-21 DIAGNOSIS — K224 Dyskinesia of esophagus: Secondary | ICD-10-CM | POA: Diagnosis not present

## 2024-08-21 DIAGNOSIS — G3184 Mild cognitive impairment, so stated: Secondary | ICD-10-CM | POA: Diagnosis not present

## 2024-08-21 DIAGNOSIS — E785 Hyperlipidemia, unspecified: Secondary | ICD-10-CM | POA: Diagnosis not present

## 2024-08-21 DIAGNOSIS — M2010 Hallux valgus (acquired), unspecified foot: Secondary | ICD-10-CM | POA: Diagnosis not present

## 2024-08-21 DIAGNOSIS — G4700 Insomnia, unspecified: Secondary | ICD-10-CM | POA: Diagnosis not present

## 2024-08-21 DIAGNOSIS — R32 Unspecified urinary incontinence: Secondary | ICD-10-CM | POA: Diagnosis not present

## 2024-08-21 DIAGNOSIS — K219 Gastro-esophageal reflux disease without esophagitis: Secondary | ICD-10-CM | POA: Diagnosis not present

## 2024-08-21 DIAGNOSIS — I442 Atrioventricular block, complete: Secondary | ICD-10-CM | POA: Diagnosis not present

## 2024-08-21 DIAGNOSIS — Z6833 Body mass index (BMI) 33.0-33.9, adult: Secondary | ICD-10-CM | POA: Diagnosis not present

## 2024-08-21 DIAGNOSIS — H409 Unspecified glaucoma: Secondary | ICD-10-CM | POA: Diagnosis not present

## 2024-08-21 DIAGNOSIS — Z89421 Acquired absence of other right toe(s): Secondary | ICD-10-CM | POA: Diagnosis not present

## 2024-08-21 DIAGNOSIS — E1151 Type 2 diabetes mellitus with diabetic peripheral angiopathy without gangrene: Secondary | ICD-10-CM | POA: Diagnosis not present

## 2024-08-21 DIAGNOSIS — N400 Enlarged prostate without lower urinary tract symptoms: Secondary | ICD-10-CM | POA: Diagnosis not present

## 2024-08-21 DIAGNOSIS — J9611 Chronic respiratory failure with hypoxia: Secondary | ICD-10-CM | POA: Diagnosis not present

## 2024-08-21 DIAGNOSIS — I4819 Other persistent atrial fibrillation: Secondary | ICD-10-CM

## 2024-08-21 DIAGNOSIS — N1831 Chronic kidney disease, stage 3a: Secondary | ICD-10-CM | POA: Diagnosis not present

## 2024-08-21 DIAGNOSIS — Z9181 History of falling: Secondary | ICD-10-CM | POA: Diagnosis not present

## 2024-08-21 DIAGNOSIS — I255 Ischemic cardiomyopathy: Secondary | ICD-10-CM | POA: Diagnosis not present

## 2024-08-21 DIAGNOSIS — E669 Obesity, unspecified: Secondary | ICD-10-CM | POA: Diagnosis not present

## 2024-08-21 DIAGNOSIS — I501 Left ventricular failure: Secondary | ICD-10-CM | POA: Diagnosis not present

## 2024-08-21 DIAGNOSIS — E1142 Type 2 diabetes mellitus with diabetic polyneuropathy: Secondary | ICD-10-CM | POA: Diagnosis not present

## 2024-08-21 DIAGNOSIS — D6869 Other thrombophilia: Secondary | ICD-10-CM | POA: Diagnosis not present

## 2024-08-21 DIAGNOSIS — Z9989 Dependence on other enabling machines and devices: Secondary | ICD-10-CM | POA: Diagnosis not present

## 2024-08-21 DIAGNOSIS — I87309 Chronic venous hypertension (idiopathic) without complications of unspecified lower extremity: Secondary | ICD-10-CM | POA: Diagnosis not present

## 2024-08-21 DIAGNOSIS — F411 Generalized anxiety disorder: Secondary | ICD-10-CM | POA: Diagnosis not present

## 2024-08-21 DIAGNOSIS — K5900 Constipation, unspecified: Secondary | ICD-10-CM | POA: Diagnosis not present

## 2024-08-21 NOTE — Telephone Encounter (Signed)
 Patient called in to check on the status of this medication refill.Patient is currently out and stated that he will become really sick if he doesn't take this medication for a few days.

## 2024-08-23 ENCOUNTER — Other Ambulatory Visit: Payer: Self-pay | Admitting: Internal Medicine

## 2024-08-23 DIAGNOSIS — I255 Ischemic cardiomyopathy: Secondary | ICD-10-CM

## 2024-08-23 DIAGNOSIS — I428 Other cardiomyopathies: Secondary | ICD-10-CM

## 2024-08-23 DIAGNOSIS — I4819 Other persistent atrial fibrillation: Secondary | ICD-10-CM

## 2024-08-24 DIAGNOSIS — I480 Paroxysmal atrial fibrillation: Secondary | ICD-10-CM | POA: Diagnosis not present

## 2024-08-24 DIAGNOSIS — E1142 Type 2 diabetes mellitus with diabetic polyneuropathy: Secondary | ICD-10-CM | POA: Diagnosis not present

## 2024-08-24 DIAGNOSIS — I2510 Atherosclerotic heart disease of native coronary artery without angina pectoris: Secondary | ICD-10-CM | POA: Diagnosis not present

## 2024-08-24 DIAGNOSIS — Z89421 Acquired absence of other right toe(s): Secondary | ICD-10-CM | POA: Diagnosis not present

## 2024-08-24 DIAGNOSIS — I429 Cardiomyopathy, unspecified: Secondary | ICD-10-CM | POA: Diagnosis not present

## 2024-08-24 DIAGNOSIS — I5022 Chronic systolic (congestive) heart failure: Secondary | ICD-10-CM | POA: Diagnosis not present

## 2024-08-24 DIAGNOSIS — J4489 Other specified chronic obstructive pulmonary disease: Secondary | ICD-10-CM | POA: Diagnosis not present

## 2024-08-24 DIAGNOSIS — I110 Hypertensive heart disease with heart failure: Secondary | ICD-10-CM | POA: Diagnosis not present

## 2024-08-24 DIAGNOSIS — I252 Old myocardial infarction: Secondary | ICD-10-CM | POA: Diagnosis not present

## 2024-08-24 NOTE — Telephone Encounter (Signed)
 Orders for EGD/Colon along with pt records  faxed to [facility] in [place]. Fax # [phone]  Unable to leave message for pt to call back.

## 2024-08-24 NOTE — Telephone Encounter (Signed)
 Just addressed - done erx   thanks

## 2024-08-25 ENCOUNTER — Telehealth: Payer: Self-pay | Admitting: Lab

## 2024-08-25 ENCOUNTER — Telehealth (HOSPITAL_COMMUNITY): Payer: Self-pay | Admitting: Cardiology

## 2024-08-25 ENCOUNTER — Ambulatory Visit: Payer: Self-pay | Admitting: Cardiology

## 2024-08-25 ENCOUNTER — Telehealth: Payer: Self-pay

## 2024-08-25 NOTE — Telephone Encounter (Signed)
 Copied from CRM [id number]. Topic: Clinical - Home Health Verbal Orders >> Aug 25, 2024  9:16 AM Berneda FALCON wrote: Caller/Agency: Heather with [facility] Callback Number: [phone] (Secured line) Service Requested: Skilled Nursing, and PT Frequency: Skilled nursing-2w1, 1w8 (PT Wil call for their orders) Any new concerns about the patient? Yes, right second toe amputated back in Sept and incision has never fully healed, but patient is going to wound care center tomorrow for eval.

## 2024-08-25 NOTE — Telephone Encounter (Signed)
 Patient called to question when device battery would happen. Reports he was told by Dr Fernande it needed to take place 06/2024  Advised would forward to EP team

## 2024-08-25 NOTE — Telephone Encounter (Signed)
 Called and left voicemail giving verbals.

## 2024-08-25 NOTE — Telephone Encounter (Signed)
 Ok for [employer]

## 2024-08-25 NOTE — Telephone Encounter (Signed)
 Patient calling requesting pain medication right second toe pain not healing in much pain has appointment with wound center next week please advise.

## 2024-08-25 NOTE — Telephone Encounter (Signed)
 Spoke with patient. Explained his defibrillator still has 2 months left until ERI per 08/05/24 remote transmission.   Let him know we will call him (likely by end of the year) but once he reaches replacement indicator and get him in to discuss generator change out.  Reassured him that he still has 3 months on his battery at that point.

## 2024-08-27 ENCOUNTER — Encounter: Payer: Self-pay | Admitting: Internal Medicine

## 2024-08-27 ENCOUNTER — Telehealth (INDEPENDENT_AMBULATORY_CARE_PROVIDER_SITE_OTHER): Admitting: Internal Medicine

## 2024-08-27 DIAGNOSIS — E11622 Type 2 diabetes mellitus with other skin ulcer: Secondary | ICD-10-CM | POA: Diagnosis not present

## 2024-08-27 DIAGNOSIS — Z7985 Long-term (current) use of injectable non-insulin antidiabetic drugs: Secondary | ICD-10-CM | POA: Diagnosis not present

## 2024-08-27 DIAGNOSIS — R300 Dysuria: Secondary | ICD-10-CM | POA: Diagnosis not present

## 2024-08-27 DIAGNOSIS — E1165 Type 2 diabetes mellitus with hyperglycemia: Secondary | ICD-10-CM

## 2024-08-27 DIAGNOSIS — L97923 Non-pressure chronic ulcer of unspecified part of left lower leg with necrosis of muscle: Secondary | ICD-10-CM | POA: Diagnosis not present

## 2024-08-27 DIAGNOSIS — R195 Other fecal abnormalities: Secondary | ICD-10-CM

## 2024-08-27 MED ORDER — ONDANSETRON 4 MG PO TBDP: 4.0000 mg | ORAL_TABLET | Freq: Three times a day (TID) | ORAL | 1 refills | Status: DC | PRN

## 2024-08-27 MED ORDER — CEPHALEXIN 500 MG PO CAPS: 500.0000 mg | ORAL_CAPSULE | Freq: Three times a day (TID) | ORAL | 0 refills | Status: DC

## 2024-08-27 NOTE — Assessment & Plan Note (Signed)
 Non healing, pt has been referred to wound clinic

## 2024-08-27 NOTE — Assessment & Plan Note (Signed)
 Without insulin , with hyperglycemia  Lab Results  Component Value Date   HGBA1C 6.0 01/07/2024   Stable, pt to continue current medical treatment mounjaro  7.5 mg weekly

## 2024-08-27 NOTE — Assessment & Plan Note (Signed)
?   Clinical significance - ok for stool ova and parasite study

## 2024-08-27 NOTE — Assessment & Plan Note (Signed)
 Mild to mod, likely uti - for ua and cultures, for antibx course cephalexin  500 tid course,  to f/u any worsening symptoms or concerns

## 2024-08-27 NOTE — Progress Notes (Signed)
 Patient ID: [name], male   DOB: 07/10/1956, 68 y.o.   MRN: [id number]  Virtual Visit via Video Note  I connected with [name] on 08/27/24 at  2:20 PM EST by a video enabled telemedicine application and verified that I am speaking with the correct person using two identifiers.  Location of all participants today Patient: at home Provider: at office   I discussed the limitations of evaluation and management by telemedicine and the availability of in person appointments. The patient expressed understanding and agreed to proceed.  History of Present Illness: Here to f/u, did have recent right second toe amputation, but has not healed, pt has been referred to wound clinic.  Pt denies chest pain, increased sob or doe, wheezing, orthopnea, PND, increased LE swelling, palpitations, dizziness or syncope.   Pt denies polydipsia, polyuria, or new focal neuro s/s.   Does have round dot in stool noted, asks for stool parasite study.  Also has 2 days onset dysuria with frequency similar to prior UTI.  Denies urinary symptoms such as urgency, flank pain, hematuria or fever, chills. Also asks zofran  refill. Past Medical History:  Diagnosis Date   AICD (automatic cardioverter/defibrillator) present    Anemia    supposed to be taking Vit B but doesn't   ANXIETY    takes Xanax  nightly   Arthritis    Asthma    Albuterol  prn and Advair  daily;also takes Prednisone  daily   Cardiomyopathy [facility])    a. EF 25% TEE July 2013; b. EF normalized 2015;  c. 03/2015 Echo: EF 40-45%, difrf HK, PASP 38 mmHg, Mild MR, sev LAE/RAE.   Chronic constipation    takes OTC stool softener   COPD (chronic obstructive pulmonary disease) (HCC)    one dr says COPD; one dr says emphysema (09/18/2017)   COVID-19 12/09/2019   DEPRESSION    takes Zoloft  and Doxepin  daily   Diverticulitis    DYSKINESIA, ESOPHAGUS    Essential hypertension        FIBROMYALGIA    GERD (gastroesophageal reflux disease)        Glaucoma     HYPERLIPIDEMIA    a. Intolerant to statins.   INSOMNIA    takes Ambien  nightly   Myocardial infarction [facility])    a. 2012 Myoview  notable for prior infarct;  b. 03/2015 Lexiscan  CL: EF 37%, diff HK, small area of inferior infarct from apex to base-->Med Rx.   O2 dependent    2.5L q hs & prn (09/18/2017)   Paroxysmal atrial fibrillation (HCC)    a. CHA2DS2VASc = 3--> takes Coumadin ;  b. 03/15/2015 Successful TEE/DCCV;  c. 03/2015 recurrent afib, Amio d/c'd in setting of hyperthyroidism.   Peripheral neuropathy    Pneumonia 12/2016   Presence of permanent cardiac pacemaker    Rash and other nonspecific skin eruption 04/12/2009   no cause found saw dermatologists x 2 and allergist   SLEEP APNEA, OBSTRUCTIVE    a. doesn't use CPAP   Syncope    a. 03/2015 s/p MDT LINQ.   Type II diabetes mellitus (HCC)        Past Surgical History:  Procedure Laterality Date   ACNE CYST REMOVAL     2 on back    AMPUTATION TOE Right 06/01/2024   Procedure: AMPUTATION, TOE;  Surgeon: Janit Thresa HERO, DPM;  Location: [facility] OR;  Service: Orthopedics/Podiatry;  Laterality: Right;  RIGHT 2ND TOE IV SEDATION   AV NODE ABLATION N/A 10/25/2017   Procedure: AV NODE  ABLATION;  Surgeon: Fernande Elspeth BROCKS, MD;  Location: [facility] INVASIVE CV LAB;  Service: Cardiovascular;  Laterality: N/A;   BIV ICD INSERTION CRT-D N/A 09/18/2017   Procedure: BIV ICD INSERTION CRT-D;  Surgeon: Fernande Elspeth BROCKS, MD;  Location: [facility] INVASIVE CV LAB;  Service: Cardiovascular;  Laterality: N/A;   CARDIAC CATHETERIZATION N/A 03/21/2016   Procedure: Right/Left Heart Cath and Coronary Angiography;  Surgeon: Ezra GORMAN Shuck, MD;  Location: [facility] INVASIVE CV LAB;  Service: Cardiovascular;  Laterality: N/A;   CARDIOVERSION  04/18/2012   Procedure: CARDIOVERSION;  Surgeon: Vina LULLA Gull, MD;  Location: [facility] OR;  Service: Cardiovascular;  Laterality: N/A;   CARDIOVERSION  04/25/2012   Procedure: CARDIOVERSION;  Surgeon: Aleene JINNY Passe, MD;  Location: [facility] ENDOSCOPY;   Service: Cardiovascular;  Laterality: N/A;   CARDIOVERSION  04/25/2012   Procedure: CARDIOVERSION;  Surgeon: Vina LULLA Gull, MD;  Location: [facility] OR;  Service: Cardiovascular;  Laterality: N/A;   CARDIOVERSION  05/09/2012   Procedure: CARDIOVERSION;  Surgeon: Ozell Fell, MD;  Location: [facility] OR;  Service: Cardiovascular;  Laterality: N/A;  changed from crenshaw to cooper by trish/leone-endo   CARDIOVERSION N/A 03/15/2015   Procedure: CARDIOVERSION;  Surgeon: Aleene JINNY Passe, MD;  Location: [facility] ENDOSCOPY;  Service: Cardiovascular;  Laterality: N/A;   COLONOSCOPY     COLONOSCOPY WITH PROPOFOL  N/A 10/21/2014   Procedure: COLONOSCOPY WITH PROPOFOL ;  Surgeon: Gwendlyn ONEIDA Buddy, MD;  Location: [facility] ENDOSCOPY;  Service: Endoscopy;  Laterality: N/A;   EP IMPLANTABLE DEVICE N/A 04/06/2015   Procedure: Loop Recorder Insertion;  Surgeon: Danelle LELON Birmingham, MD;  Location: [facility] INVASIVE CV LAB;  Service: Cardiovascular;  Laterality: N/A;   ESOPHAGOGASTRODUODENOSCOPY     JOINT REPLACEMENT     LOOP RECORDER REMOVAL N/A 09/18/2017   Procedure: LOOP RECORDER REMOVAL;  Surgeon: Fernande Elspeth BROCKS, MD;  Location: [facility] INVASIVE CV LAB;  Service: Cardiovascular;  Laterality: N/A;   RIGHT/LEFT HEART CATH AND CORONARY ANGIOGRAPHY N/A 01/28/2017   Procedure: Right/Left Heart Cath and Coronary Angiography;  Surgeon: Ezra GORMAN Shuck, MD;  Location: [facility] INVASIVE CV LAB;  Service: Cardiovascular;  Laterality: N/A;   TEE WITHOUT CARDIOVERSION  04/25/2012   Procedure: TRANSESOPHAGEAL ECHOCARDIOGRAM (TEE);  Surgeon: Aleene JINNY Passe, MD;  Location: [facility] ENDOSCOPY;  Service: Cardiovascular;  Laterality: N/A;   TEE WITHOUT CARDIOVERSION N/A 03/15/2015   Procedure: TRANSESOPHAGEAL ECHOCARDIOGRAM (TEE);  Surgeon: Aleene JINNY Passe, MD;  Location: [facility] ENDOSCOPY;  Service: Cardiovascular;  Laterality: N/A;   TONSILLECTOMY AND ADENOIDECTOMY     TOTAL KNEE ARTHROPLASTY Right 06/15/2014   Procedure: TOTAL KNEE ARTHROPLASTY;  Surgeon: Evalene JONETTA Chancy, MD;  Location: [facility]  OR;  Service: Orthopedics;  Laterality: Right;   TOTAL KNEE ARTHROPLASTY Left 10/17/2021   Procedure: Left TOTAL KNEE ARTHROPLASTY;  Surgeon: Vernetta Lonni GRADE, MD;  Location: [facility] OR;  Service: Orthopedics;  Laterality: Left;    reports that he quit smoking about 16 years ago. His smoking use included cigarettes. He started smoking about 46 years ago. He has a 60 pack-year smoking history. He has never used smokeless tobacco. He reports that he does not drink alcohol and does not use drugs. family history includes Allergies in his mother; Asthma in his maternal grandmother and mother; COPD in his mother; Colon polyps in his mother; Hypothyroidism in his mother. Allergies  Allergen Reactions   Amiodarone  Other (See Comments)    Caused hyperthyroidism    Statins Other (See Comments)    Myalgias    Sulfa  Antibiotics Other (See Comments)  Mouth ulcers   Wound Dressing Adhesive Other (See Comments)    Skin Tears Use Paper Tape Only   Current Outpatient Medications on File Prior to Visit  Medication Sig Dispense Refill   acetaminophen  (TYLENOL ) 325 MG tablet Take 2 tablets (650 mg total) by mouth every 6 (six) hours as needed for fever or mild pain (pain score 1-3).     albuterol  (VENTOLIN  HFA) 108 (90 Base) MCG/ACT inhaler INHALE 1-2 PUFFS BY MOUTH EVERY 6 HOURS AS NEEDED FOR WHEEZE OR SHORTNESS OF BREATH 8.5 each 5   ALPRAZolam  (XANAX ) 0.5 MG tablet TAKE 2 TABLETS (1 MG TOTAL) BY MOUTH EVERY DAY AT BEDTIME 60 tablet 2   apixaban  (ELIQUIS ) 5 MG TABS tablet Take 1 tablet (5 mg total) by mouth 2 (two) times daily. 60 tablet 11   ascorbic acid (VITAMIN C) 500 MG tablet Take 500 mg by mouth daily.     augmented betamethasone  dipropionate (DIPROLENE -AF) 0.05 % cream Apply 1 application  topically daily as needed (to affected areas- for itching).     Cholecalciferol  (VITAMIN D3) 50 MCG (2000 UT) TABS Take 2,000 Units by mouth daily.     Cyanocobalamin  (VITAMIN B-12) 2500 MCG SUBL Place 2,500  mcg under the tongue daily.     doxepin  (SINEQUAN ) 10 MG capsule Take 2 capsules (20 mg total) by mouth at bedtime. 180 capsule 1   DULoxetine  (CYMBALTA ) 60 MG capsule Take 1 capsule (60 mg total) by mouth daily. 90 capsule 0   Dupilumab (DUPIXENT) 300 MG/2ML SOPN Inject 300 mg into the skin every 14 (fourteen) days.     eplerenone  (INSPRA ) 25 MG tablet Take 25 mg by mouth daily.     esomeprazole  (NEXIUM ) 40 MG capsule Take 1 capsule (40 mg total) by mouth daily at 12 noon. 30 capsule 2   Evolocumab  (REPATHA  SURECLICK) 140 MG/ML SOAJ INJECT 140 MG INTO THE SKIN EVERY 14 DAYS 6 mL 3   fluticasone  (FLONASE ) 50 MCG/ACT nasal spray Place 2 sprays into both nostrils daily as needed for allergies or rhinitis. 48 mL 1   gabapentin  (NEURONTIN ) 100 MG capsule TAKE 1 CAPSULE (100 MG TOTAL) BY MOUTH THREE TIMES DAILY. 90 capsule 3   ipratropium-albuterol  (DUONEB) 0.5-2.5 (3) MG/3ML SOLN TAKE 3 ML BY NEBULIZATION EVERY 4 HOURS AS NEEDED (WHEEZING). 360 mL 0   lidocaine  (LIDODERM ) 5 % PLACE 1 PATCH ONTO THE SKIN DAILY. REMOVE & DISCARD PATCH WITHIN 12 HOURS OR AS DIRECTED BY MD 30 patch 2   linaclotide  (LINZESS ) 72 MCG capsule Take 1 capsule (72 mcg total) by mouth daily before breakfast. 90 capsule 3   losartan  (COZAAR ) 25 MG tablet Take 25 mg by mouth daily.     magnesium  hydroxide (MILK OF MAGNESIA) 400 MG/5ML suspension Take 5 mLs by mouth daily as needed for mild constipation. 355 mL 0   meclizine  (ANTIVERT ) 25 MG tablet Take 1 tablet (25 mg total) by mouth 3 (three) times daily as needed for dizziness. 30 tablet 0   Multiple Vitamins-Minerals (SENIOR VITES PO) Take 1 tablet by mouth daily with breakfast.     omeprazole  (PRILOSEC) 20 MG capsule TAKE 1 CAPSULE BY MOUTH TWICE A DAY 180 capsule 1   oxyCODONE -acetaminophen  (PERCOCET) 5-325 MG tablet Take 1 tablet by mouth every 6 (six) hours as needed for severe pain (pain score 7-10). 20 tablet 0   predniSONE  (DELTASONE ) 10 MG tablet TAKE 1 TABLET (10 MG  TOTAL) BY MOUTH DAILY WITH BREAKFAST. 90 tablet 0   predniSONE  (DELTASONE )  10 MG tablet 3 tabs by mouth per day for 3 days,2tabs per day for 3 days,1tab per day for 3 days 18 tablet 0   tamsulosin  (FLOMAX ) 0.4 MG CAPS capsule Take 1 capsule (0.4 mg total) by mouth daily. 30 capsule 5   Tiotropium Bromide  Monohydrate (SPIRIVA  RESPIMAT) 2.5 MCG/ACT AERS Take 2 puffs by nebulization daily. 4 g 3   tirzepatide  (MOUNJARO ) 7.5 MG/0.5ML Pen Inject 7.5 mg into the skin once a week. 6 mL 3   tiZANidine  (ZANAFLEX ) 4 MG tablet TAKE 1 TABLET BY MOUTH 3 TIMES DAILY. 270 tablet 2   torsemide  (DEMADEX ) 20 MG tablet Take 4 tablets (80 mg total) by mouth 2 (two) times daily. 720 tablet 2   traZODone  (DESYREL ) 100 MG tablet TAKE 2 TABLETS BY MOUTH AT BEDTIME AS NEEDED (Patient taking differently: Take 200 mg by mouth at bedtime.) 180 tablet 1   TUMS 500 MG chewable tablet Chew 1 tablet by mouth 2 (two) times daily as needed for indigestion or heartburn.     valACYclovir  (VALTREX ) 1000 MG tablet Take 1,000 mg by mouth 3 (three) times daily as needed (Outbreaks).     venlafaxine  XR (EFFEXOR -XR) 150 MG 24 hr capsule TAKE 1 CAPSULE BY MOUTH DAILY WITH BREAKFAST. 90 capsule 3   WIXELA INHUB 250-50 MCG/ACT AEPB INHALE 1 PUFF INTO THE LUNGS IN THE MORNING AND AT BEDTIME. 60 each 8   zolpidem  (AMBIEN ) 5 MG tablet Take 5 mg by mouth at bedtime.     No current facility-administered medications on file prior to visit.    Observations/Objective: Alert, NAD, appropriate mood and affect, resps normal, cn 2-12 intact, moves all 4s, no visible rash or swelling Lab Results  Component Value Date   WBC 14.8 (H) 05/20/2024   HGB 11.0 (L) 05/20/2024   HCT 35.1 (L) 05/20/2024   PLT 284 05/20/2024   GLUCOSE 87 05/28/2024   CHOL 130 01/07/2024   TRIG 139.0 01/07/2024   HDL 52.40 01/07/2024   LDLDIRECT 72.0 02/28/2021   LDLCALC 49 01/07/2024   ALT 18 05/04/2024   AST 27 05/04/2024   NA 139 05/28/2024   K 4.3 05/28/2024   CL  101 05/28/2024   CREATININE 1.07 05/28/2024   BUN 26 (H) 05/28/2024   CO2 30 05/28/2024   TSH 2.02 01/07/2024   PSA 3.46 01/07/2024   INR 1.3 (H) 05/04/2024   HGBA1C 6.0 01/07/2024   MICROALBUR <0.7 01/07/2024   Assessment and Plan: See notes  Follow Up Instructions: See notes   I discussed the assessment and treatment plan with the patient. The patient was provided an opportunity to ask questions and all were answered. The patient agreed with the plan and demonstrated an understanding of the instructions.   The patient was advised to call back or seek an in-person evaluation if the symptoms worsen or if the condition fails to improve as anticipated.   Lynwood Rush, MD

## 2024-08-27 NOTE — Patient Instructions (Signed)
 Please take all new medication as prescribed  - the antibiotic  Please continue all other medications as before, and refills have been done if requested.  Please have the pharmacy call with any other refills you may need.  Please keep your appointments with your specialists as you may have planned  Please go to the LAB at the blood drawing area for the tests to be done - just the urine and stool testing this time

## 2024-08-28 ENCOUNTER — Ambulatory Visit

## 2024-08-28 NOTE — Telephone Encounter (Signed)
Patient calling in regards to previous note. Please advise.

## 2024-08-31 NOTE — Telephone Encounter (Signed)
Unable to reach pt: Pt voice mail box is full:  

## 2024-09-01 NOTE — Telephone Encounter (Signed)
Unable to reach pt: Pt voice mail box is full:  

## 2024-09-02 ENCOUNTER — Other Ambulatory Visit

## 2024-09-02 DIAGNOSIS — R300 Dysuria: Secondary | ICD-10-CM

## 2024-09-02 NOTE — Telephone Encounter (Signed)
Unable to reach pt: Pt voice mail box is full:  

## 2024-09-03 LAB — URINE CULTURE: Result:: NO GROWTH

## 2024-09-04 ENCOUNTER — Ambulatory Visit: Attending: Student in an Organized Health Care Education/Training Program

## 2024-09-04 ENCOUNTER — Ambulatory Visit: Payer: Self-pay | Admitting: Internal Medicine

## 2024-09-04 ENCOUNTER — Ambulatory Visit

## 2024-09-04 LAB — CUP PACEART REMOTE DEVICE CHECK
Battery Remaining Longevity: 2 mo
Battery Voltage: 2.76 V
Brady Statistic AP VP Percent: 0 %
Brady Statistic AP VS Percent: 0 %
Brady Statistic AS VP Percent: 0 %
Brady Statistic AS VS Percent: 0 %
Brady Statistic RA Percent Paced: 0 %
Brady Statistic RV Percent Paced: 99.59 %
Date Time Interrogation Session: 20251121022605
HighPow Impedance: 72 Ohm
Implantable Lead Connection Status: 753985
Implantable Lead Connection Status: 753985
Implantable Lead Implant Date: 20181205
Implantable Lead Implant Date: 20181205
Implantable Lead Location: 753858
Implantable Lead Location: 753860
Implantable Lead Model: 4398
Implantable Pulse Generator Implant Date: 20181205
Lead Channel Impedance Value: 1083 Ohm
Lead Channel Impedance Value: 1140 Ohm
Lead Channel Impedance Value: 1178 Ohm
Lead Channel Impedance Value: 1178 Ohm
Lead Channel Impedance Value: 270.667
Lead Channel Impedance Value: 302.831
Lead Channel Impedance Value: 306.303
Lead Channel Impedance Value: 308.894
Lead Channel Impedance Value: 312.507
Lead Channel Impedance Value: 342 Ohm
Lead Channel Impedance Value: 399 Ohm
Lead Channel Impedance Value: 4047 Ohm
Lead Channel Impedance Value: 532 Ohm
Lead Channel Impedance Value: 551 Ohm
Lead Channel Impedance Value: 703 Ohm
Lead Channel Impedance Value: 722 Ohm
Lead Channel Impedance Value: 836 Ohm
Lead Channel Impedance Value: 988 Ohm
Lead Channel Pacing Threshold Amplitude: 0.375 V
Lead Channel Pacing Threshold Pulse Width: 0.4 ms
Lead Channel Sensing Intrinsic Amplitude: 13.375 mV
Lead Channel Sensing Intrinsic Amplitude: 13.375 mV
Lead Channel Setting Pacing Amplitude: 2.5 V
Lead Channel Setting Pacing Amplitude: 2.5 V
Lead Channel Setting Pacing Pulse Width: 0.4 ms
Lead Channel Setting Pacing Pulse Width: 1 ms
Lead Channel Setting Sensing Sensitivity: 0.3 mV
Zone Setting Status: 755011

## 2024-09-07 ENCOUNTER — Ambulatory Visit: Attending: Student in an Organized Health Care Education/Training Program

## 2024-09-07 DIAGNOSIS — Z95810 Presence of automatic (implantable) cardiac defibrillator: Secondary | ICD-10-CM | POA: Diagnosis not present

## 2024-09-07 DIAGNOSIS — I5022 Chronic systolic (congestive) heart failure: Secondary | ICD-10-CM | POA: Diagnosis not present

## 2024-09-07 NOTE — Telephone Encounter (Addendum)
Left message for pt to call back:  My Chart message sent to pt:  

## 2024-09-07 NOTE — Progress Notes (Signed)
 EPIC Encounter for ICM Monitoring  Patient Name: [name] is a 68 y.o. male Date: 09/07/2024 Primary Care Physican: Norleen Lynwood ORN, MD Primary Cardiologist: Rolan Electrophysiologist: Almetta Pore Pacing: 99.7%    11/04/2023 Office Weight: 285 lbs 05/26/2024 Home Weight: 240 lbs (approximately 30 lb loss over the last few months) 06/29/2024 Office Weight: 250 lbs 09/07/2024 Weight: 244 lbs   Battery Longevity: 2 months     ASpoke with patient and heart failure questions reviewed.  Transmission results reviewed.  Pt reports swelling and weight increase come and go depending on what he eats.  Diet: 09/07/2024 Not strict in limiting salt intake.    Since 08/05/2024 ICM Remote Transmission: Optivol thoracic impedance suggesting possible fluid level fluctuation of possible fluid accumulation and currently trending back toward baseline.   Prescribed:  Torsemide  20 mg 4 tablet(s) (80 mg total) by mouth twice a day.  Potassium 20 mEq take 3 tablets (60 mEq total) by mouth daily   Labs: 05/28/2024 Creatinine 1.07, BUN 26, Potassium 4.3, Sodium 139, GFR >60 05/04/2024 Creatinine 0.92, BUN 12, Potassium 3.3, Sodium 138, GFR >60  04/17/2024 Creatinine 1.24, BUN 16, Potassium 3.2, Sodium 134, GFR >60  04/16/2024 Creatinine 0.93, BUN 11, Potassium 3.2, Sodium 136, GFR >60  04/15/2024 Creatinine 1.07, BUN 13, Potassium 3.5, Sodium 136, GFR >60 04/14/2024 Creatinine 1.03, BUN 15, Potassium 3.7, Sodium 137, GFR >60  04/13/2024 Creatinine 1.05, BUN 17, Potassium 4.5, Sodium 133, GFR >60  04/12/2024 Creatinine 1.27, BUN 23, Potassium 4.1, Sodium 134, GFR >60  A complete set of results can be found in Results Review.   Recommendations:  Recommendation to limit salt intake to 2000 mg daily and fluid intake to 64 oz daily.  Encouraged to call if experiencing any fluid symptoms.    Follow-up plan: ICM clinic phone appointment on 10/12/2024.   91 day device clinic remote transmission 11/05/2024.    EP/Cardiology Office Visits:    Recall 12/07/2024 with Dr Fernande.   Advised to call Dr Orvilla office for recall appointment.  Recall 08/18/2024 with Dr Rolan.    Copy of ICM check sent to Dr. Almetta.   Remote monitoring is medically necessary for Heart Failure Management.    Daily Thoracic Impedance ICM trend: 06/08/2024 through 09/07/2024.    12-14 Month Thoracic Impedance ICM trend:     Mitzie GORMAN Garner, RN 09/07/2024 4:34 PM

## 2024-09-09 ENCOUNTER — Encounter (HOSPITAL_BASED_OUTPATIENT_CLINIC_OR_DEPARTMENT_OTHER): Admitting: General Surgery

## 2024-09-09 ENCOUNTER — Telehealth (HOSPITAL_BASED_OUTPATIENT_CLINIC_OR_DEPARTMENT_OTHER): Payer: Self-pay

## 2024-09-09 NOTE — Telephone Encounter (Signed)
 Spoke with [employer] and they will check on the reason he has not received his ONO yet as they do have the order and reach out to pt . Pt notified of this     Copied from CRM #[id number]. Topic: General - Other >> Sep 09, 2024 10:17 AM Essie A wrote: Reason for CRM: Patient saw Dr. Kassie on 07/29/24.  He said she was supposed to put something in the mail for him, but he was not sure of what it was he is supposed to receive.  Please call him at [phone].  Thanks.

## 2024-09-14 ENCOUNTER — Telehealth: Payer: Self-pay | Admitting: Internal Medicine

## 2024-09-14 NOTE — Telephone Encounter (Signed)
 Spoke with Pt. Pt stated that he has not heard anything from [facility] about his referral, [facility] was contacted and spoke with Rodgers who stated that they have received the referral and the pt can call their office to be scheduled.  Pt made aware.  Pt requested information be sent to pt via my chart. My chart message was sent. Pt made aware.  Pt verbalized understanding with all questions answered.

## 2024-09-14 NOTE — Telephone Encounter (Unsigned)
 Copied from CRM #[id number]. Topic: Clinical - Medication Refill >> Sep 14, 2024 11:19 AM Tinnie C wrote: Medication: zolpidem  (AMBIEN ) 5 MG tablet Ambien  shows historical provider, however his most recent bottle does have Dr. Nicola name on it and he says he has been getting it for years.  Hydroxyzine  10mg  (shows discontinued, but pt states itching has started again and would like to get back on medication.   Has the patient contacted their pharmacy? No (Agent: If no, request that the patient contact the pharmacy for the refill. If patient does not wish to contact the pharmacy document the reason why and proceed with request.) (Agent: If yes, when and what did the pharmacy advise?)  This is the patient's preferred pharmacy:  [id number] [address], [place] - [address] [place] [address] Phone: [phone] Fax: [phone]   Is this the correct pharmacy for this prescription? Yes If no, delete pharmacy and type the correct one.   Has the prescription been filled recently? No  Is the patient out of the medication? No  Has the patient been seen for an appointment in the last year OR does the patient have an upcoming appointment? Yes  Can we respond through MyChart? Nurses preference  Agent: Please be advised that Rx refills may take up to 3 business days. We ask that you follow-up with your pharmacy.

## 2024-09-15 MED ORDER — HYDROXYZINE PAMOATE 100 MG PO CAPS: 100.0000 mg | ORAL_CAPSULE | Freq: Three times a day (TID) | ORAL | 2 refills | Status: AC | PRN

## 2024-09-15 MED ORDER — ZOLPIDEM TARTRATE 5 MG PO TABS: 5.0000 mg | ORAL_TABLET | Freq: Every day | ORAL | 5 refills | Status: DC

## 2024-09-16 ENCOUNTER — Ambulatory Visit (HOSPITAL_COMMUNITY)

## 2024-09-17 ENCOUNTER — Telehealth: Payer: Self-pay

## 2024-09-17 ENCOUNTER — Ambulatory Visit: Payer: Medicare HMO

## 2024-09-17 VITALS — Ht 70.0 in | Wt 240.0 lb

## 2024-09-17 DIAGNOSIS — Z0000 Encounter for general adult medical examination without abnormal findings: Secondary | ICD-10-CM | POA: Diagnosis not present

## 2024-09-17 NOTE — Telephone Encounter (Signed)
Needs ROV please 

## 2024-09-17 NOTE — Progress Notes (Signed)
 Chief Complaint  Patient presents with   Medicare Wellness     Subjective:   [name] is a 68 y.o. male who presents for a Medicare Annual Wellness Visit.  Visit info / Clinical Intake: Medicare Wellness Visit Type:: Subsequent Annual Wellness Visit Persons participating in visit and providing information:: patient Medicare Wellness Visit Mode:: Telephone If telephone:: video declined Since this visit was completed virtually, some vitals may be partially provided or unavailable. Missing vitals are due to the limitations of the virtual format.: Documented vitals are patient reported If Telephone or Video please confirm:: I connected with patient using audio/video enable telemedicine. I verified patient identity with two identifiers, discussed telehealth limitations, and patient agreed to proceed. Patient Location:: home Provider Location:: home office Interpreter Needed?: No Pre-visit prep was completed: yes AWV questionnaire completed by patient prior to visit?: no Living arrangements:: lives with spouse/significant other Patient's Overall Health Status Rating: good Typical amount of pain: some Does pain affect daily life?: no Are you currently prescribed opioids?: no  Dietary Habits and Nutritional Risks How many meals a day?: 3 Eats fruit and vegetables daily?: yes Most meals are obtained by: preparing own meals; having others provide food In the last 2 weeks, have you had any of the following?: (!) nausea, vomiting, diarrhea Diabetic:: (!) yes Any non-healing wounds?: no How often do you check your BS?: 0 Would you like to be referred to a Nutritionist or for Diabetic Management? : no  Functional Status Activities of Daily Living (to include ambulation/medication): Independent Ambulation: Independent Medication Administration: Independent Home Management (perform basic housework or laundry): Independent Manage your own finances?: yes Primary transportation is:  driving Concerns about vision?: no *vision screening is required for WTM* Concerns about hearing?: no  Fall Screening Falls in the past year?: 0 Number of falls in past year: 0 Was there an injury with Fall?: 0 Fall Risk Category Calculator: 0 Patient Fall Risk Level: Low Fall Risk  Fall Risk Patient at Risk for Falls Due to: No Fall Risks Fall risk Follow up: Falls prevention discussed; Education provided; Falls evaluation completed  Home and Transportation Safety: All rugs have non-skid backing?: yes All stairs or steps have railings?: yes Grab bars in the bathtub or shower?: yes Have non-skid surface in bathtub or shower?: yes Good home lighting?: yes Regular seat belt use?: yes Hospital stays in the last year:: no  Cognitive Assessment Difficulty concentrating, remembering, or making decisions? : no Will 6CIT or Mini Cog be Completed: no 6CIT or Mini Cog Declined: patient alert, oriented, able to answer questions appropriately and recall recent events  Advance Directives (For Healthcare) Does Patient Have a Medical Advance Directive?: No Does patient want to make changes to medical advance directive?: No - Patient declined Type of Advance Directive: Healthcare Power of [place]; Living will Copy of Healthcare Power of Attorney in Chart?: No - copy requested Would patient like information on creating a medical advance directive?: Yes (MAU/Ambulatory/Procedural Areas - Information given)  Reviewed/Updated  Reviewed/Updated: Reviewed All (Medical, Surgical, Family, Medications, Allergies, Care Teams, Patient Goals)    Allergies (verified) Amiodarone , Statins, Sulfa  antibiotics, and Wound dressing adhesive   Current Medications (verified) Outpatient Encounter Medications as of 09/17/2024  Medication Sig   acetaminophen  (TYLENOL ) 325 MG tablet Take 2 tablets (650 mg total) by mouth every 6 (six) hours as needed for fever or mild pain (pain score 1-3).   albuterol   (VENTOLIN  HFA) 108 (90 Base) MCG/ACT inhaler INHALE 1-2 PUFFS BY MOUTH EVERY  6 HOURS AS NEEDED FOR WHEEZE OR SHORTNESS OF BREATH   ALPRAZolam  (XANAX ) 0.5 MG tablet TAKE 2 TABLETS (1 MG TOTAL) BY MOUTH EVERY DAY AT BEDTIME   apixaban  (ELIQUIS ) 5 MG TABS tablet Take 1 tablet (5 mg total) by mouth 2 (two) times daily.   ascorbic acid (VITAMIN C) 500 MG tablet Take 500 mg by mouth daily.   augmented betamethasone  dipropionate (DIPROLENE -AF) 0.05 % cream Apply 1 application  topically daily as needed (to affected areas- for itching).   Cholecalciferol  (VITAMIN D3) 50 MCG (2000 UT) TABS Take 2,000 Units by mouth daily.   Cyanocobalamin  (VITAMIN B-12) 2500 MCG SUBL Place 2,500 mcg under the tongue daily.   doxepin  (SINEQUAN ) 10 MG capsule Take 2 capsules (20 mg total) by mouth at bedtime.   DULoxetine  (CYMBALTA ) 60 MG capsule Take 1 capsule (60 mg total) by mouth daily.   Dupilumab (DUPIXENT) 300 MG/2ML SOPN Inject 300 mg into the skin every 14 (fourteen) days.   eplerenone  (INSPRA ) 25 MG tablet Take 25 mg by mouth daily.   esomeprazole  (NEXIUM ) 40 MG capsule Take 1 capsule (40 mg total) by mouth daily at 12 noon.   Evolocumab  (REPATHA  SURECLICK) 140 MG/ML SOAJ INJECT 140 MG INTO THE SKIN EVERY 14 DAYS   fluticasone  (FLONASE ) 50 MCG/ACT nasal spray Place 2 sprays into both nostrils daily as needed for allergies or rhinitis.   gabapentin  (NEURONTIN ) 100 MG capsule TAKE 1 CAPSULE (100 MG TOTAL) BY MOUTH THREE TIMES DAILY.   hydrOXYzine  (VISTARIL ) 100 MG capsule Take 1 capsule (100 mg total) by mouth 3 (three) times daily as needed for itching.   ipratropium-albuterol  (DUONEB) 0.5-2.5 (3) MG/3ML SOLN TAKE 3 ML BY NEBULIZATION EVERY 4 HOURS AS NEEDED (WHEEZING).   lidocaine  (LIDODERM ) 5 % PLACE 1 PATCH ONTO THE SKIN DAILY. REMOVE & DISCARD PATCH WITHIN 12 HOURS OR AS DIRECTED BY MD   linaclotide  (LINZESS ) 72 MCG capsule Take 1 capsule (72 mcg total) by mouth daily before breakfast.   losartan  (COZAAR ) 25 MG  tablet Take 25 mg by mouth daily.   magnesium  hydroxide (MILK OF MAGNESIA) 400 MG/5ML suspension Take 5 mLs by mouth daily as needed for mild constipation.   meclizine  (ANTIVERT ) 25 MG tablet Take 1 tablet (25 mg total) by mouth 3 (three) times daily as needed for dizziness.   Multiple Vitamins-Minerals (SENIOR VITES PO) Take 1 tablet by mouth daily with breakfast.   omeprazole  (PRILOSEC) 20 MG capsule TAKE 1 CAPSULE BY MOUTH TWICE A DAY   ondansetron  (ZOFRAN -ODT) 4 MG disintegrating tablet Take 1 tablet (4 mg total) by mouth every 8 (eight) hours as needed.   predniSONE  (DELTASONE ) 10 MG tablet TAKE 1 TABLET (10 MG TOTAL) BY MOUTH DAILY WITH BREAKFAST.   predniSONE  (DELTASONE ) 10 MG tablet 3 tabs by mouth per day for 3 days,2tabs per day for 3 days,1tab per day for 3 days   tamsulosin  (FLOMAX ) 0.4 MG CAPS capsule Take 1 capsule (0.4 mg total) by mouth daily.   Tiotropium Bromide  Monohydrate (SPIRIVA  RESPIMAT) 2.5 MCG/ACT AERS Take 2 puffs by nebulization daily.   tirzepatide  (MOUNJARO ) 7.5 MG/0.5ML Pen Inject 7.5 mg into the skin once a week.   tiZANidine  (ZANAFLEX ) 4 MG tablet TAKE 1 TABLET BY MOUTH 3 TIMES DAILY.   torsemide  (DEMADEX ) 20 MG tablet Take 4 tablets (80 mg total) by mouth 2 (two) times daily.   traZODone  (DESYREL ) 100 MG tablet TAKE 2 TABLETS BY MOUTH AT BEDTIME AS NEEDED (Patient taking differently: Take 200 mg by  mouth at bedtime.)   TUMS 500 MG chewable tablet Chew 1 tablet by mouth 2 (two) times daily as needed for indigestion or heartburn.   valACYclovir  (VALTREX ) 1000 MG tablet Take 1,000 mg by mouth 3 (three) times daily as needed (Outbreaks).   venlafaxine  XR (EFFEXOR -XR) 150 MG 24 hr capsule TAKE 1 CAPSULE BY MOUTH DAILY WITH BREAKFAST.   WIXELA INHUB 250-50 MCG/ACT AEPB INHALE 1 PUFF INTO THE LUNGS IN THE MORNING AND AT BEDTIME.   zolpidem  (AMBIEN ) 5 MG tablet Take 1 tablet (5 mg total) by mouth at bedtime.   cephALEXin  (KEFLEX ) 500 MG capsule Take 1 capsule (500 mg  total) by mouth 3 (three) times daily. (Patient not taking: Reported on 09/17/2024)   oxyCODONE -acetaminophen  (PERCOCET) 5-325 MG tablet Take 1 tablet by mouth every 6 (six) hours as needed for severe pain (pain score 7-10). (Patient not taking: Reported on 09/17/2024)   No facility-administered encounter medications on file as of 09/17/2024.    History: Past Medical History:  Diagnosis Date   AICD (automatic cardioverter/defibrillator) present    Anemia    supposed to be taking Vit B but doesn't   ANXIETY    takes Xanax  nightly   Arthritis    Asthma    Albuterol  prn and Advair  daily;also takes Prednisone  daily   Cardiomyopathy [facility])    a. EF 25% TEE July 2013; b. EF normalized 2015;  c. 03/2015 Echo: EF 40-45%, difrf HK, PASP 38 mmHg, Mild MR, sev LAE/RAE.   Chronic constipation    takes OTC stool softener   COPD (chronic obstructive pulmonary disease) (HCC)    one dr says COPD; one dr says emphysema (09/18/2017)   COVID-19 12/09/2019   DEPRESSION    takes Zoloft  and Doxepin  daily   Diverticulitis    DYSKINESIA, ESOPHAGUS    Essential hypertension        FIBROMYALGIA    GERD (gastroesophageal reflux disease)        Glaucoma    HYPERLIPIDEMIA    a. Intolerant to statins.   INSOMNIA    takes Ambien  nightly   Myocardial infarction [facility])    a. 2012 Myoview  notable for prior infarct;  b. 03/2015 Lexiscan  CL: EF 37%, diff HK, small area of inferior infarct from apex to base-->Med Rx.   O2 dependent    2.5L q hs & prn (09/18/2017)   Paroxysmal atrial fibrillation (HCC)    a. CHA2DS2VASc = 3--> takes Coumadin ;  b. 03/15/2015 Successful TEE/DCCV;  c. 03/2015 recurrent afib, Amio d/c'd in setting of hyperthyroidism.   Peripheral neuropathy    Pneumonia 12/2016   Presence of permanent cardiac pacemaker    Rash and other nonspecific skin eruption 04/12/2009   no cause found saw dermatologists x 2 and allergist   SLEEP APNEA, OBSTRUCTIVE    a. doesn't use CPAP   Syncope    a.  03/2015 s/p MDT LINQ.   Type II diabetes mellitus (HCC)        Past Surgical History:  Procedure Laterality Date   ACNE CYST REMOVAL     2 on back    AMPUTATION TOE Right 06/01/2024   Procedure: AMPUTATION, TOE;  Surgeon: Janit Thresa HERO, DPM;  Location: [facility] OR;  Service: Orthopedics/Podiatry;  Laterality: Right;  RIGHT 2ND TOE IV SEDATION   AV NODE ABLATION N/A 10/25/2017   Procedure: AV NODE ABLATION;  Surgeon: Fernande Elspeth BROCKS, MD;  Location: [facility] INVASIVE CV LAB;  Service: Cardiovascular;  Laterality: N/A;   BIV ICD INSERTION CRT-D  N/A 09/18/2017   Procedure: BIV ICD INSERTION CRT-D;  Surgeon: Fernande Elspeth BROCKS, MD;  Location: [facility] INVASIVE CV LAB;  Service: Cardiovascular;  Laterality: N/A;   CARDIAC CATHETERIZATION N/A 03/21/2016   Procedure: Right/Left Heart Cath and Coronary Angiography;  Surgeon: Ezra GORMAN Shuck, MD;  Location: [facility] INVASIVE CV LAB;  Service: Cardiovascular;  Laterality: N/A;   CARDIOVERSION  04/18/2012   Procedure: CARDIOVERSION;  Surgeon: Vina LULLA Gull, MD;  Location: [facility] OR;  Service: Cardiovascular;  Laterality: N/A;   CARDIOVERSION  04/25/2012   Procedure: CARDIOVERSION;  Surgeon: Aleene JINNY Passe, MD;  Location: [facility] ENDOSCOPY;  Service: Cardiovascular;  Laterality: N/A;   CARDIOVERSION  04/25/2012   Procedure: CARDIOVERSION;  Surgeon: Vina LULLA Gull, MD;  Location: [facility] OR;  Service: Cardiovascular;  Laterality: N/A;   CARDIOVERSION  05/09/2012   Procedure: CARDIOVERSION;  Surgeon: Ozell Fell, MD;  Location: [facility] OR;  Service: Cardiovascular;  Laterality: N/A;  changed from crenshaw to cooper by trish/leone-endo   CARDIOVERSION N/A 03/15/2015   Procedure: CARDIOVERSION;  Surgeon: Aleene JINNY Passe, MD;  Location: [facility] ENDOSCOPY;  Service: Cardiovascular;  Laterality: N/A;   COLONOSCOPY     COLONOSCOPY WITH PROPOFOL  N/A 10/21/2014   Procedure: COLONOSCOPY WITH PROPOFOL ;  Surgeon: Gwendlyn ONEIDA Buddy, MD;  Location: [facility] ENDOSCOPY;  Service: Endoscopy;  Laterality: N/A;   EP IMPLANTABLE DEVICE N/A  04/06/2015   Procedure: Loop Recorder Insertion;  Surgeon: Danelle LELON Birmingham, MD;  Location: [facility] INVASIVE CV LAB;  Service: Cardiovascular;  Laterality: N/A;   ESOPHAGOGASTRODUODENOSCOPY     JOINT REPLACEMENT     LOOP RECORDER REMOVAL N/A 09/18/2017   Procedure: LOOP RECORDER REMOVAL;  Surgeon: Fernande Elspeth BROCKS, MD;  Location: [facility] INVASIVE CV LAB;  Service: Cardiovascular;  Laterality: N/A;   RIGHT/LEFT HEART CATH AND CORONARY ANGIOGRAPHY N/A 01/28/2017   Procedure: Right/Left Heart Cath and Coronary Angiography;  Surgeon: Ezra GORMAN Shuck, MD;  Location: [facility] INVASIVE CV LAB;  Service: Cardiovascular;  Laterality: N/A;   TEE WITHOUT CARDIOVERSION  04/25/2012   Procedure: TRANSESOPHAGEAL ECHOCARDIOGRAM (TEE);  Surgeon: Aleene JINNY Passe, MD;  Location: [facility] ENDOSCOPY;  Service: Cardiovascular;  Laterality: N/A;   TEE WITHOUT CARDIOVERSION N/A 03/15/2015   Procedure: TRANSESOPHAGEAL ECHOCARDIOGRAM (TEE);  Surgeon: Aleene JINNY Passe, MD;  Location: [facility] ENDOSCOPY;  Service: Cardiovascular;  Laterality: N/A;   TONSILLECTOMY AND ADENOIDECTOMY     TOTAL KNEE ARTHROPLASTY Right 06/15/2014   Procedure: TOTAL KNEE ARTHROPLASTY;  Surgeon: Evalene JONETTA Chancy, MD;  Location: [facility] OR;  Service: Orthopedics;  Laterality: Right;   TOTAL KNEE ARTHROPLASTY Left 10/17/2021   Procedure: Left TOTAL KNEE ARTHROPLASTY;  Surgeon: Vernetta Lonni GRADE, MD;  Location: [facility] OR;  Service: Orthopedics;  Laterality: Left;   Family History  Problem Relation Age of Onset   COPD Mother    Asthma Mother    Colon polyps Mother    Allergies Mother    Hypothyroidism Mother    Asthma Maternal Grandmother    Colon cancer Neg Hx    Social History   Occupational History   Occupation: retired/disabled. prev worked in teacher, english as a foreign language.    Employer: DISABLED  Tobacco Use   Smoking status: Former    Current packs/day: 0.00    Average packs/day: 2.0 packs/day for 30.0 years (60.0 ttl pk-yrs)    Types: Cigarettes    Start date: 10/15/1977    Quit  date: 10/16/2007    Years since quitting: 16.9   Smokeless tobacco: Never  Vaping Use   Vaping status: Never Used  Substance  and Sexual Activity   Alcohol use: No   Drug use: No   Sexual activity: Not Currently   Tobacco Counseling Counseling given: Not Answered  SDOH Screenings   Food Insecurity: No Food Insecurity (09/17/2024)  Housing: Low Risk  (09/17/2024)  Transportation Needs: No Transportation Needs (04/13/2024)  Utilities: Not At Risk (04/13/2024)  Alcohol Screen: Low Risk  (09/16/2023)  Depression (PHQ2-9): Low Risk  (09/17/2024)  Financial Resource Strain: Medium Risk (01/01/2024)  Physical Activity: Inactive (09/17/2024)  Social Connections: Socially Isolated (09/17/2024)  Stress: No Stress Concern Present (09/17/2024)  Tobacco Use: Medium Risk (09/17/2024)  Health Literacy: Adequate Health Literacy (09/17/2024)   See flowsheets for full screening details  Depression Screen PHQ 2 & 9 Depression Scale- Over the past 2 weeks, how often have you been bothered by any of the following problems? Little interest or pleasure in doing things: 0 Feeling down, depressed, or hopeless (PHQ Adolescent also includes...irritable): 0 PHQ-2 Total Score: 0     Goals Addressed             This Visit's Progress    To be medically well.   On track            Objective:    Today's Vitals   09/17/24 1615  Weight: 240 lb (108.9 kg)  Height: 5' 10 (1.778 m)   Body mass index is 34.44 kg/m.  Hearing/Vision screen Hearing Screening - Comments:: Patient is able to hear conversational tones without difficulty. No issues reported.  Vision Screening - Comments:: Wears rx glasses - up to date with routine eye exams with Dr. Zara  Immunizations and Health Maintenance Health Maintenance  Topic Date Due   Colonoscopy  03/06/2020   HEMOGLOBIN A1C  07/09/2024   OPHTHALMOLOGY EXAM  10/27/2024   FOOT EXAM  01/02/2025   Diabetic kidney evaluation - Urine ACR  01/06/2025    DTaP/Tdap/Td (3 - Td or Tdap) 02/22/2025   Diabetic kidney evaluation - eGFR measurement  05/28/2025   Medicare Annual Wellness (AWV)  09/17/2025   Pneumococcal Vaccine: 50+ Years  Completed   Influenza Vaccine  Completed   Hepatitis C Screening  Completed   Meningococcal B Vaccine  Aged Out   Lung Cancer Screening  Discontinued   COVID-19 Vaccine  Discontinued   Zoster Vaccines- Shingrix  Discontinued        Assessment/Plan:  This is a routine wellness examination for Gary.  Patient Care Team: Norleen Lynwood ORN, MD as PCP - General (Internal Medicine) Rolan Ezra RAMAN, MD as PCP - Advanced Heart Failure (Cardiology) Fernande Elspeth BROCKS, MD (Inactive) as PCP - Electrophysiology (Cardiology) Fernande Elspeth BROCKS, MD (Inactive) as Consulting Physician (Cardiology) Vernetta Lonni GRADE, MD as Consulting Physician (Orthopedic Surgery) Jeanne Michae LABOR, MD as Referring Physician (Ophthalmology) Janit Thresa HERO, DPM as Consulting Physician (Podiatry) Onita Duos, MD as Consulting Physician (Neurology) Kassie Acquanetta Bradley, MD as Consulting Physician (Pulmonary Disease)  I have personally reviewed and noted the following in the patient's chart:   Medical and social history Use of alcohol, tobacco or illicit drugs  Current medications and supplements including opioid prescriptions. Functional ability and status Nutritional status Physical activity Advanced directives List of other physicians Hospitalizations, surgeries, and ER visits in previous 12 months Vitals Screenings to include cognitive, depression, and falls Referrals and appointments  No orders of the defined types were placed in this encounter.  In addition, I have reviewed and discussed with patient certain preventive protocols, quality metrics, and best practice recommendations. A written  personalized care plan for preventive services as well as general preventive health recommendations were provided to patient.   Lavelle Charmaine Browner, LPN   [id number]   Return in 1 year (on 09/17/2025).  After Visit Summary: (MyChart) Due to this being a telephonic visit, the after visit summary with patients personalized plan was offered to patient via MyChart   Nurse Notes: See telephone note;  patient is scheduled for colonoscopy but not until March 2026 with [facility]

## 2024-09-17 NOTE — Patient Instructions (Signed)
 [name],  Thank you for taking the time for your Medicare Wellness Visit. I appreciate your continued commitment to your health goals. Please review the care plan we discussed, and feel free to reach out if I can assist you further.  Please note that Annual Wellness Visits do not include a physical exam. Some assessments may be limited, especially if the visit was conducted virtually. If needed, we may recommend an in-person follow-up with your provider.  Ongoing Care Seeing your primary care provider every 3 to 6 months helps us  monitor your health and provide consistent, personalized care.   Referrals If a referral was made during today's visit and you haven't received any updates within two weeks, please contact the referred provider directly to check on the status.  Recommended Screenings:  Health Maintenance  Topic Date Due   Colon Cancer Screening  03/06/2020   Hemoglobin A1C  07/09/2024   Eye exam for diabetics  10/27/2024   Complete foot exam   01/02/2025   Yearly kidney health urinalysis for diabetes  01/06/2025   DTaP/Tdap/Td vaccine (3 - Td or Tdap) 02/22/2025   Yearly kidney function blood test for diabetes  05/28/2025   Medicare Annual Wellness Visit  09/17/2025   Pneumococcal Vaccine for age over 79  Completed   Flu Shot  Completed   Hepatitis C Screening  Completed   Meningitis B Vaccine  Aged Out   Screening for Lung Cancer  Discontinued   COVID-19 Vaccine  Discontinued   Zoster (Shingles) Vaccine  Discontinued       09/17/2024    4:17 PM  Advanced Directives  Does Patient Have a Medical Advance Directive? No  Would patient like information on creating a medical advance directive? Yes (MAU/Ambulatory/Procedural Areas - Information given)   Information on Advanced Care Planning can be found at   Secretary of [facility] Advance Health Care Directives Advance Health Care Directives ([email or web address]   Vision: Annual vision screenings are recommended for early  detection of glaucoma, cataracts, and diabetic retinopathy. These exams can also reveal signs of chronic conditions such as diabetes and high blood pressure.  Dental: Annual dental screenings help detect early signs of oral cancer, gum disease, and other conditions linked to overall health, including heart disease and diabetes.  Please see the attached documents for additional preventive care recommendations.

## 2024-09-17 NOTE — Telephone Encounter (Signed)
 Patient seen today for AWV.  States for the last couple of weeks he has been having GI upset.  He is scheduled for colonoscopy with [facility] but not until March 2026.  He is having intermittent loose stools and abdominal pain.  Please advise.

## 2024-09-18 ENCOUNTER — Telehealth: Payer: Self-pay | Admitting: Internal Medicine

## 2024-09-18 NOTE — Telephone Encounter (Signed)
 Called and left voicemail for Pt to callback and schedule an OV.

## 2024-09-18 NOTE — Progress Notes (Unsigned)
 [facility] [address] [place] [address] Phone: [phone] Subjective:   [name], am serving as a scribe for Dr. Arthea Claudene.  I'm seeing this patient by the request  of:  Norleen Lynwood ORN, MD  CC: Bilateral shoulder pain  YEP:Dlagzrupcz  07/28/2024 Bilateral injections given today, tolerated the procedure well.  Patient does have significant torticollis of the neck as well that I think is contributing to some more of the discomfort and pain as well.  Discussed icing regimen and home exercises, increase activity slowly.  Follow-up again in 3 to 4 months     Updated 09/23/2024 [name] is a 68 y.o. male coming in with complaint of B shoulder pain. Patient states that his shoulder pain has increased. Patient feels like he cracked a rib on R side. Painful when he inspires, expires and coughs. Has lidocaine  patch on area currently. Was reaching for something in his bed.       Past Medical History:  Diagnosis Date   AICD (automatic cardioverter/defibrillator) present    Anemia    supposed to be taking Vit B but doesn't   ANXIETY    takes Xanax  nightly   Arthritis    Asthma    Albuterol  prn and Advair  daily;also takes Prednisone  daily   Cardiomyopathy [facility])    a. EF 25% TEE July 2013; b. EF normalized 2015;  c. 03/2015 Echo: EF 40-45%, difrf HK, PASP 38 mmHg, Mild MR, sev LAE/RAE.   Chronic constipation    takes OTC stool softener   COPD (chronic obstructive pulmonary disease) (HCC)    one dr says COPD; one dr says emphysema (09/18/2017)   COVID-19 12/09/2019   DEPRESSION    takes Zoloft  and Doxepin  daily   Diverticulitis    DYSKINESIA, ESOPHAGUS    Essential hypertension        FIBROMYALGIA    GERD (gastroesophageal reflux disease)        Glaucoma    HYPERLIPIDEMIA    a. Intolerant to statins.   INSOMNIA    takes Ambien  nightly   Myocardial infarction [facility])    a. 2012 Myoview  notable for prior infarct;  b. 03/2015  Lexiscan  CL: EF 37%, diff HK, small area of inferior infarct from apex to base-->Med Rx.   O2 dependent    2.5L q hs & prn (09/18/2017)   Paroxysmal atrial fibrillation (HCC)    a. CHA2DS2VASc = 3--> takes Coumadin ;  b. 03/15/2015 Successful TEE/DCCV;  c. 03/2015 recurrent afib, Amio d/c'd in setting of hyperthyroidism.   Peripheral neuropathy    Pneumonia 12/2016   Presence of permanent cardiac pacemaker    Rash and other nonspecific skin eruption 04/12/2009   no cause found saw dermatologists x 2 and allergist   SLEEP APNEA, OBSTRUCTIVE    a. doesn't use CPAP   Syncope    a. 03/2015 s/p MDT LINQ.   Type II diabetes mellitus (HCC)        Past Surgical History:  Procedure Laterality Date   ACNE CYST REMOVAL     2 on back    AMPUTATION TOE Right 06/01/2024   Procedure: AMPUTATION, TOE;  Surgeon: Janit Thresa HERO, DPM;  Location: [facility] OR;  Service: Orthopedics/Podiatry;  Laterality: Right;  RIGHT 2ND TOE IV SEDATION   AV NODE ABLATION N/A 10/25/2017   Procedure: AV NODE ABLATION;  Surgeon: Fernande Elspeth BROCKS, MD;  Location: [facility] INVASIVE CV LAB;  Service: Cardiovascular;  Laterality: N/A;   BIV  ICD INSERTION CRT-D N/A 09/18/2017   Procedure: BIV ICD INSERTION CRT-D;  Surgeon: Fernande Elspeth BROCKS, MD;  Location: [facility] INVASIVE CV LAB;  Service: Cardiovascular;  Laterality: N/A;   CARDIAC CATHETERIZATION N/A 03/21/2016   Procedure: Right/Left Heart Cath and Coronary Angiography;  Surgeon: Ezra GORMAN Shuck, MD;  Location: [facility] INVASIVE CV LAB;  Service: Cardiovascular;  Laterality: N/A;   CARDIOVERSION  04/18/2012   Procedure: CARDIOVERSION;  Surgeon: Vina LULLA Gull, MD;  Location: [facility] OR;  Service: Cardiovascular;  Laterality: N/A;   CARDIOVERSION  04/25/2012   Procedure: CARDIOVERSION;  Surgeon: Aleene JINNY Passe, MD;  Location: [facility] ENDOSCOPY;  Service: Cardiovascular;  Laterality: N/A;   CARDIOVERSION  04/25/2012   Procedure: CARDIOVERSION;  Surgeon: Vina LULLA Gull, MD;  Location: [facility] OR;  Service: Cardiovascular;   Laterality: N/A;   CARDIOVERSION  05/09/2012   Procedure: CARDIOVERSION;  Surgeon: Ozell Fell, MD;  Location: [facility] OR;  Service: Cardiovascular;  Laterality: N/A;  changed from crenshaw to cooper by trish/leone-endo   CARDIOVERSION N/A 03/15/2015   Procedure: CARDIOVERSION;  Surgeon: Aleene JINNY Passe, MD;  Location: [facility] ENDOSCOPY;  Service: Cardiovascular;  Laterality: N/A;   COLONOSCOPY     COLONOSCOPY WITH PROPOFOL  N/A 10/21/2014   Procedure: COLONOSCOPY WITH PROPOFOL ;  Surgeon: Gwendlyn ONEIDA Buddy, MD;  Location: [facility] ENDOSCOPY;  Service: Endoscopy;  Laterality: N/A;   EP IMPLANTABLE DEVICE N/A 04/06/2015   Procedure: Loop Recorder Insertion;  Surgeon: Danelle LELON Birmingham, MD;  Location: [facility] INVASIVE CV LAB;  Service: Cardiovascular;  Laterality: N/A;   ESOPHAGOGASTRODUODENOSCOPY     JOINT REPLACEMENT     LOOP RECORDER REMOVAL N/A 09/18/2017   Procedure: LOOP RECORDER REMOVAL;  Surgeon: Fernande Elspeth BROCKS, MD;  Location: [facility] INVASIVE CV LAB;  Service: Cardiovascular;  Laterality: N/A;   RIGHT/LEFT HEART CATH AND CORONARY ANGIOGRAPHY N/A 01/28/2017   Procedure: Right/Left Heart Cath and Coronary Angiography;  Surgeon: Ezra GORMAN Shuck, MD;  Location: [facility] INVASIVE CV LAB;  Service: Cardiovascular;  Laterality: N/A;   TEE WITHOUT CARDIOVERSION  04/25/2012   Procedure: TRANSESOPHAGEAL ECHOCARDIOGRAM (TEE);  Surgeon: Aleene JINNY Passe, MD;  Location: [facility] ENDOSCOPY;  Service: Cardiovascular;  Laterality: N/A;   TEE WITHOUT CARDIOVERSION N/A 03/15/2015   Procedure: TRANSESOPHAGEAL ECHOCARDIOGRAM (TEE);  Surgeon: Aleene JINNY Passe, MD;  Location: [facility] ENDOSCOPY;  Service: Cardiovascular;  Laterality: N/A;   TONSILLECTOMY AND ADENOIDECTOMY     TOTAL KNEE ARTHROPLASTY Right 06/15/2014   Procedure: TOTAL KNEE ARTHROPLASTY;  Surgeon: Evalene JONETTA Chancy, MD;  Location: [facility] OR;  Service: Orthopedics;  Laterality: Right;   TOTAL KNEE ARTHROPLASTY Left 10/17/2021   Procedure: Left TOTAL KNEE ARTHROPLASTY;  Surgeon: Vernetta Lonni GRADE, MD;   Location: [facility] OR;  Service: Orthopedics;  Laterality: Left;   Social History   Socioeconomic History   Marital status: Divorced    Spouse name: Not on file   Number of children: 2   Years of education: Not on file   Highest education level: GED or equivalent  Occupational History   Occupation: retired/disabled. prev worked in teacher, english as a foreign language.    Employer: DISABLED  Tobacco Use   Smoking status: Former    Current packs/day: 0.00    Average packs/day: 2.0 packs/day for 30.0 years (60.0 ttl pk-yrs)    Types: Cigarettes    Start date: 10/15/1977    Quit date: 10/16/2007    Years since quitting: 16.9   Smokeless tobacco: Never  Vaping Use   Vaping status: Never Used  Substance and Sexual Activity   Alcohol use: No  Drug use: No   Sexual activity: Not Currently  Other Topics Concern   Not on file  Social History Narrative   Lives with girlfriend   Social Drivers of Health   Financial Resource Strain: Medium Risk (01/01/2024)   Overall Financial Resource Strain (CARDIA)    Difficulty of Paying Living Expenses: Somewhat hard  Food Insecurity: No Food Insecurity (09/17/2024)   Hunger Vital Sign    Worried About Running Out of Food in the Last Year: Never true    Ran Out of Food in the Last Year: Never true  Transportation Needs: No Transportation Needs (04/13/2024)   PRAPARE - Administrator, Civil Service (Medical): No    Lack of Transportation (Non-Medical): No  Physical Activity: Inactive (09/17/2024)   Exercise Vital Sign    Days of Exercise per Week: 0 days    Minutes of Exercise per Session: 0 min  Stress: No Stress Concern Present (09/17/2024)   [employer] of Occupational Health - Occupational Stress Questionnaire    Feeling of Stress: Only a little  Social Connections: Socially Isolated (09/17/2024)   Social Connection and Isolation Panel    Frequency of Communication with Friends and Family: Once a week    Frequency of Social Gatherings with Friends  and Family: Never    Attends Religious Services: Never    Diplomatic Services Operational Officer: No    Attends Engineer, Structural: Never    Marital Status: Divorced   Allergies  Allergen Reactions   Amiodarone  Other (See Comments)    Caused hyperthyroidism    Statins Other (See Comments)    Myalgias    Sulfa  Antibiotics Other (See Comments)    Mouth ulcers   Wound Dressing Adhesive Other (See Comments)    Skin Tears Use Paper Tape Only   Family History  Problem Relation Age of Onset   COPD Mother    Asthma Mother    Colon polyps Mother    Allergies Mother    Hypothyroidism Mother    Asthma Maternal Grandmother    Colon cancer Neg Hx     Current Outpatient Medications (Endocrine & Metabolic):    predniSONE  (DELTASONE ) 10 MG tablet, TAKE 1 TABLET (10 MG TOTAL) BY MOUTH DAILY WITH BREAKFAST.   predniSONE  (DELTASONE ) 10 MG tablet, 3 tabs by mouth per day for 3 days,2tabs per day for 3 days,1tab per day for 3 days   tirzepatide  (MOUNJARO ) 10 MG/0.5ML Pen, Inject 10 mg into the skin once a week.  Current Outpatient Medications (Cardiovascular):    eplerenone  (INSPRA ) 25 MG tablet, Take 25 mg by mouth daily.   Evolocumab  (REPATHA  SURECLICK) 140 MG/ML SOAJ, INJECT 140 MG INTO THE SKIN EVERY 14 DAYS   losartan  (COZAAR ) 25 MG tablet, Take 25 mg by mouth daily.   torsemide  (DEMADEX ) 20 MG tablet, Take 4 tablets (80 mg total) by mouth 2 (two) times daily.  Current Outpatient Medications (Respiratory):    albuterol  (VENTOLIN  HFA) 108 (90 Base) MCG/ACT inhaler, INHALE 1-2 PUFFS BY MOUTH EVERY 6 HOURS AS NEEDED FOR WHEEZE OR SHORTNESS OF BREATH   fluticasone  (FLONASE ) 50 MCG/ACT nasal spray, Place 2 sprays into both nostrils daily as needed for allergies or rhinitis.   ipratropium-albuterol  (DUONEB) 0.5-2.5 (3) MG/3ML SOLN, TAKE 3 ML BY NEBULIZATION EVERY 4 HOURS AS NEEDED (WHEEZING).   Tiotropium Bromide  Monohydrate (SPIRIVA  RESPIMAT) 2.5 MCG/ACT AERS, Take 2 puffs by  nebulization daily.   WIXELA INHUB 250-50 MCG/ACT AEPB, INHALE 1 PUFF INTO THE  LUNGS IN THE MORNING AND AT BEDTIME.  Current Outpatient Medications (Analgesics):    acetaminophen  (TYLENOL ) 325 MG tablet, Take 2 tablets (650 mg total) by mouth every 6 (six) hours as needed for fever or mild pain (pain score 1-3).   oxyCODONE -acetaminophen  (PERCOCET) 5-325 MG tablet, Take 1 tablet by mouth every 6 (six) hours as needed for severe pain (pain score 7-10). (Patient not taking: Reported on 09/17/2024)  Current Outpatient Medications (Hematological):    apixaban  (ELIQUIS ) 5 MG TABS tablet, Take 1 tablet (5 mg total) by mouth 2 (two) times daily.   Cyanocobalamin  (VITAMIN B-12) 2500 MCG SUBL, Place 2,500 mcg under the tongue daily.  Current Outpatient Medications (Other):    ALPRAZolam  (XANAX ) 0.5 MG tablet, TAKE 2 TABLETS (1 MG TOTAL) BY MOUTH EVERY DAY AT BEDTIME   ascorbic acid (VITAMIN C) 500 MG tablet, Take 500 mg by mouth daily.   augmented betamethasone  dipropionate (DIPROLENE -AF) 0.05 % cream, Apply 1 application  topically daily as needed (to affected areas- for itching).   cephALEXin  (KEFLEX ) 500 MG capsule, Take 1 capsule (500 mg total) by mouth 3 (three) times daily. (Patient not taking: Reported on 09/17/2024)   Cholecalciferol  (VITAMIN D3) 50 MCG (2000 UT) TABS, Take 2,000 Units by mouth daily.   doxepin  (SINEQUAN ) 10 MG capsule, Take 2 capsules (20 mg total) by mouth at bedtime.   DULoxetine  (CYMBALTA ) 60 MG capsule, Take 1 capsule (60 mg total) by mouth daily.   Dupilumab (DUPIXENT) 300 MG/2ML SOPN, Inject 300 mg into the skin every 14 (fourteen) days.   esomeprazole  (NEXIUM ) 40 MG capsule, Take 1 capsule (40 mg total) by mouth daily at 12 noon.   gabapentin  (NEURONTIN ) 100 MG capsule, TAKE 1 CAPSULE (100 MG TOTAL) BY MOUTH THREE TIMES DAILY.   hydrOXYzine  (VISTARIL ) 100 MG capsule, Take 1 capsule (100 mg total) by mouth 3 (three) times daily as needed for itching.   lidocaine  (LIDODERM )  5 %, PLACE 1 PATCH ONTO THE SKIN DAILY. REMOVE & DISCARD PATCH WITHIN 12 HOURS OR AS DIRECTED BY MD   linaclotide  (LINZESS ) 72 MCG capsule, Take 1 capsule (72 mcg total) by mouth daily before breakfast.   magnesium  hydroxide (MILK OF MAGNESIA) 400 MG/5ML suspension, Take 5 mLs by mouth daily as needed for mild constipation.   meclizine  (ANTIVERT ) 25 MG tablet, Take 1 tablet (25 mg total) by mouth 3 (three) times daily as needed for dizziness.   Multiple Vitamins-Minerals (SENIOR VITES PO), Take 1 tablet by mouth daily with breakfast.   omeprazole  (PRILOSEC) 20 MG capsule, TAKE 1 CAPSULE BY MOUTH TWICE A DAY   ondansetron  (ZOFRAN -ODT) 4 MG disintegrating tablet, Take 1 tablet (4 mg total) by mouth every 8 (eight) hours as needed.   tamsulosin  (FLOMAX ) 0.4 MG CAPS capsule, Take 1 capsule (0.4 mg total) by mouth daily.   tiZANidine  (ZANAFLEX ) 4 MG tablet, TAKE 1 TABLET BY MOUTH 3 TIMES DAILY.   traZODone  (DESYREL ) 100 MG tablet, TAKE 2 TABLETS BY MOUTH AT BEDTIME AS NEEDED (Patient taking differently: Take 200 mg by mouth at bedtime.)   TUMS 500 MG chewable tablet, Chew 1 tablet by mouth 2 (two) times daily as needed for indigestion or heartburn.   valACYclovir  (VALTREX ) 1000 MG tablet, Take 1,000 mg by mouth 3 (three) times daily as needed (Outbreaks).   venlafaxine  XR (EFFEXOR -XR) 150 MG 24 hr capsule, TAKE 1 CAPSULE BY MOUTH DAILY WITH BREAKFAST.   zolpidem  (AMBIEN ) 5 MG tablet, Take 1 tablet (5 mg total) by mouth at bedtime  as needed for sleep.   Reviewed prior external information including notes and imaging from  primary care provider As well as notes that were available from care everywhere and other healthcare systems.  Past medical history, social, surgical and family history all reviewed in electronic medical record.  No pertanent information unless stated regarding to the chief complaint.   Review of Systems:  No headache, visual changes, nausea, vomiting, diarrhea, constipation,  dizziness, abdominal pain, skin rash, fevers, chills, night sweats, weight loss, swollen lymph nodes, body aches, joint swelling, chest pain, shortness of breath, mood changes. POSITIVE muscle aches  Objective  Blood pressure (!) 88/52, pulse (!) 56, height 5' 10 (1.778 m), SpO2 99%.   General: No apparent distress alert and oriented x3 mood and affect normal, dressed appropriately.  HEENT: Pupils equal, extraocular movements intact  Respiratory: Patient is wearing oxygen .  Using a walker to ambulate.  Significant torticollis of the neck noted.  Shoulders do have atrophy noted.  Tender to palpation diffusely.  Procedure: Real-time Ultrasound Guided Injection of right glenohumeral joint Device: GE Logiq Q7  Ultrasound guided injection is preferred based studies that show increased duration, increased effect, greater accuracy, decreased procedural pain, increased response rate with ultrasound guided versus blind injection.  Verbal informed consent obtained.  Time-out conducted.  Noted no overlying erythema, induration, or other signs of local infection.  Skin prepped in a sterile fashion.  Local anesthesia: Topical Ethyl chloride.  With sterile technique and under real time ultrasound guidance:  Joint visualized.  23g 1  inch needle inserted posterior approach. Pictures taken for needle placement. Patient did have injection of 2 cc of 1% lidocaine , 2 cc of 0.5% Marcaine , and 1.0 cc of Kenalog  40 mg/dL. Completed without difficulty  Pain immediately resolved suggesting accurate placement of the medication. \ Images saved Advised to call if fevers/chills, erythema, induration, drainage, or persistent bleeding.  Impression: Technically successful ultrasound guided injection.  Procedure: Real-time Ultrasound Guided Injection of left glenohumeral joint Device: GE Logiq E  Ultrasound guided injection is preferred based studies that show increased duration, increased effect, greater accuracy,  decreased procedural pain, increased response rate with ultrasound guided versus blind injection.  Verbal informed consent obtained.  Time-out conducted.  Noted no overlying erythema, induration, or other signs of local infection.  Skin prepped in a sterile fashion.  Local anesthesia: Topical Ethyl chloride.  With sterile technique and under real time ultrasound guidance:  Joint visualized.  21g 2 inch needle inserted posterior approach. Pictures taken for needle placement. Patient did have injection of 2 cc of 0.5% Marcaine , and 1cc of Kenalog  40 mg/dL. Completed without difficulty  Pain immediately resolved suggesting accurate placement of the medication.  Advised to call if fevers/chills, erythema, induration, drainage, or persistent bleeding.  Images permanently stored  Impression: Technically successful ultrasound guided injection.     Impression and Recommendations:      The above documentation has been reviewed and is accurate and complete Shatana Saxton M Avia Merkley, DO

## 2024-09-18 NOTE — Telephone Encounter (Signed)
 Copied from CRM [id number]. Topic: Clinical - Prescription Issue >> Sep 18, 2024  4:17 PM Alfonso ORN wrote: Reason for CRM: pt called to see if he can increase dosage on Mounjaro  prescription to about 10mg  because it doesn't seem to work enough. Please contact pt to advise.

## 2024-09-21 MED ORDER — TIRZEPATIDE 10 MG/0.5ML ~~LOC~~ SOAJ: 10.0000 mg | SUBCUTANEOUS | 3 refills | Status: AC

## 2024-09-21 MED ORDER — ZOLPIDEM TARTRATE 5 MG PO TABS: 5.0000 mg | ORAL_TABLET | Freq: Every evening | ORAL | 5 refills | Status: DC | PRN

## 2024-09-21 NOTE — Telephone Encounter (Signed)
 Ok for increased mounjaro  10 mg weekly - done erx

## 2024-09-21 NOTE — Addendum Note (Signed)
 Addended by: NORLEEN LYNWOOD ORN on: 09/21/2024 12:49 PM   Modules accepted: Orders

## 2024-09-22 ENCOUNTER — Ambulatory Visit: Payer: Self-pay | Admitting: Student in an Organized Health Care Education/Training Program

## 2024-09-23 ENCOUNTER — Encounter: Payer: Self-pay | Admitting: Family Medicine

## 2024-09-23 ENCOUNTER — Ambulatory Visit: Admitting: Family Medicine

## 2024-09-23 ENCOUNTER — Other Ambulatory Visit: Payer: Self-pay

## 2024-09-23 VITALS — BP 88/52 | HR 56 | Ht 70.0 in

## 2024-09-23 DIAGNOSIS — M25512 Pain in left shoulder: Secondary | ICD-10-CM

## 2024-09-23 DIAGNOSIS — M12811 Other specific arthropathies, not elsewhere classified, right shoulder: Secondary | ICD-10-CM

## 2024-09-23 DIAGNOSIS — M12812 Other specific arthropathies, not elsewhere classified, left shoulder: Secondary | ICD-10-CM | POA: Diagnosis not present

## 2024-09-23 DIAGNOSIS — G8929 Other chronic pain: Secondary | ICD-10-CM

## 2024-09-23 DIAGNOSIS — M25511 Pain in right shoulder: Secondary | ICD-10-CM

## 2024-09-23 NOTE — Assessment & Plan Note (Addendum)
 Chronic problem with exacerbation.  Spoke with patient about icing regimen and home exercises, which activities to do and which ones to avoid.  Increase activity slowly.  Discussed icing regimen.  Follow-up again in 10 to 12 weeks.  Social determinants of health include patient is physically inactive secondary to other comorbidities and does have significant social isolation.

## 2024-09-23 NOTE — Patient Instructions (Signed)
 Injected both shoulders Happy Holidays See me again in 8-10 weeks

## 2024-09-24 ENCOUNTER — Telehealth: Payer: Self-pay | Admitting: Family Medicine

## 2024-09-24 ENCOUNTER — Other Ambulatory Visit: Payer: Self-pay

## 2024-09-24 MED ORDER — PREDNISONE 10 MG PO TABS: 10.0000 mg | ORAL_TABLET | Freq: Every day | ORAL | 0 refills | Status: AC

## 2024-09-24 NOTE — Telephone Encounter (Signed)
 Patient called and states that he needs a refill on prednisone . Please advise.

## 2024-09-25 ENCOUNTER — Telehealth (HOSPITAL_COMMUNITY): Payer: Self-pay

## 2024-09-25 NOTE — Telephone Encounter (Signed)
 Called to confirm/remind patient of [facility] on 09/25[facility].   Appointment:   [x] Confirmed  [] Left mess   [] No answer/No voice mail  [] VM Full/unable to leave message  [] Phone not in service  Patient reminded to bring all medications and/or complete list.  Confirmed patient has transportation. Gave directions, instructed to utilize valet parking.

## 2024-09-28 ENCOUNTER — Ambulatory Visit (HOSPITAL_COMMUNITY)

## 2024-09-28 LAB — OVA AND PARASITE EXAMINATION: CONCENTRATE RESULT:: NONE SEEN

## 2024-10-05 ENCOUNTER — Ambulatory Visit

## 2024-10-05 ENCOUNTER — Ambulatory Visit: Attending: Student in an Organized Health Care Education/Training Program

## 2024-10-05 ENCOUNTER — Other Ambulatory Visit: Payer: Self-pay

## 2024-10-06 ENCOUNTER — Encounter: Payer: Self-pay | Admitting: Internal Medicine

## 2024-10-06 ENCOUNTER — Telehealth: Admitting: Internal Medicine

## 2024-10-06 DIAGNOSIS — J449 Chronic obstructive pulmonary disease, unspecified: Secondary | ICD-10-CM

## 2024-10-06 DIAGNOSIS — R197 Diarrhea, unspecified: Secondary | ICD-10-CM | POA: Diagnosis not present

## 2024-10-06 DIAGNOSIS — E559 Vitamin D deficiency, unspecified: Secondary | ICD-10-CM

## 2024-10-06 DIAGNOSIS — J438 Other emphysema: Secondary | ICD-10-CM

## 2024-10-06 LAB — CUP PACEART REMOTE DEVICE CHECK
Battery Remaining Longevity: 2 mo
Battery Voltage: 2.72 V
Brady Statistic AP VP Percent: 0 %
Brady Statistic AP VS Percent: 0 %
Brady Statistic AS VP Percent: 0 %
Brady Statistic AS VS Percent: 0 %
Brady Statistic RA Percent Paced: 0 %
Brady Statistic RV Percent Paced: 99.42 %
Date Time Interrogation Session: 20251223043728
HighPow Impedance: 74 Ohm
Implantable Lead Connection Status: 753985
Implantable Lead Connection Status: 753985
Implantable Lead Implant Date: 20181205
Implantable Lead Implant Date: 20181205
Implantable Lead Location: 753858
Implantable Lead Location: 753860
Implantable Lead Model: 4398
Implantable Pulse Generator Implant Date: 20181205
Lead Channel Impedance Value: 1007 Ohm
Lead Channel Impedance Value: 1064 Ohm
Lead Channel Impedance Value: 1064 Ohm
Lead Channel Impedance Value: 250.943
Lead Channel Impedance Value: 266.667
Lead Channel Impedance Value: 273.729
Lead Channel Impedance Value: 283.733
Lead Channel Impedance Value: 291.742
Lead Channel Impedance Value: 304 Ohm
Lead Channel Impedance Value: 361 Ohm
Lead Channel Impedance Value: 4047 Ohm
Lead Channel Impedance Value: 475 Ohm
Lead Channel Impedance Value: 532 Ohm
Lead Channel Impedance Value: 608 Ohm
Lead Channel Impedance Value: 646 Ohm
Lead Channel Impedance Value: 779 Ohm
Lead Channel Impedance Value: 931 Ohm
Lead Channel Impedance Value: 988 Ohm
Lead Channel Pacing Threshold Amplitude: 0.375 V
Lead Channel Pacing Threshold Pulse Width: 0.4 ms
Lead Channel Sensing Intrinsic Amplitude: 8.75 mV
Lead Channel Sensing Intrinsic Amplitude: 8.75 mV
Lead Channel Setting Pacing Amplitude: 2.5 V
Lead Channel Setting Pacing Amplitude: 2.5 V
Lead Channel Setting Pacing Pulse Width: 0.4 ms
Lead Channel Setting Pacing Pulse Width: 1 ms
Lead Channel Setting Sensing Sensitivity: 0.3 mV
Zone Setting Status: 755011

## 2024-10-06 MED ORDER — ONDANSETRON 4 MG PO TBDP: 4.0000 mg | ORAL_TABLET | Freq: Three times a day (TID) | ORAL | 1 refills | Status: AC | PRN

## 2024-10-06 MED ORDER — KETOROLAC TROMETHAMINE 10 MG PO TABS: 10.0000 mg | ORAL_TABLET | Freq: Four times a day (QID) | ORAL | 1 refills | Status: DC | PRN

## 2024-10-06 MED ORDER — DICYCLOMINE HCL 10 MG PO CAPS: 10.0000 mg | ORAL_CAPSULE | Freq: Three times a day (TID) | ORAL | 2 refills | Status: AC

## 2024-10-06 NOTE — Patient Instructions (Signed)
 Please take all new medication as prescribed

## 2024-10-06 NOTE — Assessment & Plan Note (Signed)
 Stable overall, continue current inhaler asd

## 2024-10-06 NOTE — Progress Notes (Signed)
 Patient ID: [name], male   DOB: [birth year], 68 y.o.   MRN: [id number]  Virtual Visit via Video Note  I connected with Charlie LITTIE Fendley on 10/06/2024 at  2:00 PM EST by a video enabled telemedicine application and verified that I am speaking with the correct person using two identifiers.  Location of all participants today Patient: at home Provider: at office   I discussed the limitations of evaluation and management by telemedicine and the availability of in person appointments. The patient expressed understanding and agreed to proceed.  History of Present Illness: Here with several wks persistent sharp abd pains and watery loose stools, without fever, vomiting, or blood.   Has seen GI locally but referred for further endoscopy to [facility], with next appt late march 2026. Pt is asking for symptomatic relief, even fentanyl  patch if felt appropriate.    Pt denies chest pain, increased sob or doe, wheezing, orthopnea, PND, increased LE swelling, palpitations, dizziness or syncope.   Pt denies polydipsia, polyuria, or new focal neuro s/s.    Past Medical History:  Diagnosis Date   AICD (automatic cardioverter/defibrillator) present    Anemia    supposed to be taking Vit B but doesn't   ANXIETY    takes Xanax  nightly   Arthritis    Asthma    Albuterol  prn and Advair  daily;also takes Prednisone  daily   Cardiomyopathy [facility])    a. EF 25% TEE July 2013; b. EF normalized 2015;  c. 03/2015 Echo: EF 40-45%, difrf HK, PASP 38 mmHg, Mild MR, sev LAE/RAE.   Chronic constipation    takes OTC stool softener   COPD (chronic obstructive pulmonary disease) (HCC)    one dr says COPD; one dr says emphysema (09/18/2017)   COVID-19 12/09/2019   DEPRESSION    takes Zoloft  and Doxepin  daily   Diverticulitis    DYSKINESIA, ESOPHAGUS    Essential hypertension        FIBROMYALGIA    GERD (gastroesophageal reflux disease)        Glaucoma    HYPERLIPIDEMIA    a. Intolerant to statins.   INSOMNIA    takes  Ambien  nightly   Myocardial infarction [facility])    a. 2012 Myoview  notable for prior infarct;  b. 03/2015 Lexiscan  CL: EF 37%, diff HK, small area of inferior infarct from apex to base-->Med Rx.   O2 dependent    2.5L q hs & prn (09/18/2017)   Paroxysmal atrial fibrillation (HCC)    a. CHA2DS2VASc = 3--> takes Coumadin ;  b. 03/15/2015 Successful TEE/DCCV;  c. 03/2015 recurrent afib, Amio d/c'd in setting of hyperthyroidism.   Peripheral neuropathy    Pneumonia 12/2016   Presence of permanent cardiac pacemaker    Rash and other nonspecific skin eruption 04/12/2009   no cause found saw dermatologists x 2 and allergist   SLEEP APNEA, OBSTRUCTIVE    a. doesn't use CPAP   Syncope    a. 03/2015 s/p MDT LINQ.   Type II diabetes mellitus (HCC)        Past Surgical History:  Procedure Laterality Date   ACNE CYST REMOVAL     2 on back    AMPUTATION TOE Right 06/01/2024   Procedure: AMPUTATION, TOE;  Surgeon: Janit Thresa HERO, DPM;  Location: [facility] OR;  Service: Orthopedics/Podiatry;  Laterality: Right;  RIGHT 2ND TOE IV SEDATION   AV NODE ABLATION N/A 10/25/2017   Procedure: AV NODE ABLATION;  Surgeon: Fernande Elspeth BROCKS, MD;  Location: [facility] INVASIVE CV  LAB;  Service: Cardiovascular;  Laterality: N/A;   BIV ICD INSERTION CRT-D N/A 09/18/2017   Procedure: BIV ICD INSERTION CRT-D;  Surgeon: Fernande Elspeth BROCKS, MD;  Location: [facility] INVASIVE CV LAB;  Service: Cardiovascular;  Laterality: N/A;   CARDIAC CATHETERIZATION N/A 03/21/2016   Procedure: Right/Left Heart Cath and Coronary Angiography;  Surgeon: Ezra GORMAN Shuck, MD;  Location: [facility] INVASIVE CV LAB;  Service: Cardiovascular;  Laterality: N/A;   CARDIOVERSION  04/18/2012   Procedure: CARDIOVERSION;  Surgeon: Vina LULLA Gull, MD;  Location: [facility] OR;  Service: Cardiovascular;  Laterality: N/A;   CARDIOVERSION  04/25/2012   Procedure: CARDIOVERSION;  Surgeon: Aleene JINNY Passe, MD;  Location: [facility] ENDOSCOPY;  Service: Cardiovascular;  Laterality: N/A;   CARDIOVERSION  04/25/2012    Procedure: CARDIOVERSION;  Surgeon: Vina LULLA Gull, MD;  Location: [facility] OR;  Service: Cardiovascular;  Laterality: N/A;   CARDIOVERSION  05/09/2012   Procedure: CARDIOVERSION;  Surgeon: Ozell Fell, MD;  Location: [facility] OR;  Service: Cardiovascular;  Laterality: N/A;  changed from crenshaw to cooper by trish/leone-endo   CARDIOVERSION N/A 03/15/2015   Procedure: CARDIOVERSION;  Surgeon: Aleene JINNY Passe, MD;  Location: [facility] ENDOSCOPY;  Service: Cardiovascular;  Laterality: N/A;   COLONOSCOPY     COLONOSCOPY WITH PROPOFOL  N/A 10/21/2014   Procedure: COLONOSCOPY WITH PROPOFOL ;  Surgeon: Gwendlyn ONEIDA Buddy, MD;  Location: [facility] ENDOSCOPY;  Service: Endoscopy;  Laterality: N/A;   EP IMPLANTABLE DEVICE N/A 04/06/2015   Procedure: Loop Recorder Insertion;  Surgeon: Danelle LELON Birmingham, MD;  Location: [facility] INVASIVE CV LAB;  Service: Cardiovascular;  Laterality: N/A;   ESOPHAGOGASTRODUODENOSCOPY     JOINT REPLACEMENT     LOOP RECORDER REMOVAL N/A 09/18/2017   Procedure: LOOP RECORDER REMOVAL;  Surgeon: Fernande Elspeth BROCKS, MD;  Location: [facility] INVASIVE CV LAB;  Service: Cardiovascular;  Laterality: N/A;   RIGHT/LEFT HEART CATH AND CORONARY ANGIOGRAPHY N/A 01/28/2017   Procedure: Right/Left Heart Cath and Coronary Angiography;  Surgeon: Ezra GORMAN Shuck, MD;  Location: [facility] INVASIVE CV LAB;  Service: Cardiovascular;  Laterality: N/A;   TEE WITHOUT CARDIOVERSION  04/25/2012   Procedure: TRANSESOPHAGEAL ECHOCARDIOGRAM (TEE);  Surgeon: Aleene JINNY Passe, MD;  Location: [facility] ENDOSCOPY;  Service: Cardiovascular;  Laterality: N/A;   TEE WITHOUT CARDIOVERSION N/A 03/15/2015   Procedure: TRANSESOPHAGEAL ECHOCARDIOGRAM (TEE);  Surgeon: Aleene JINNY Passe, MD;  Location: [facility] ENDOSCOPY;  Service: Cardiovascular;  Laterality: N/A;   TONSILLECTOMY AND ADENOIDECTOMY     TOTAL KNEE ARTHROPLASTY Right 06/15/2014   Procedure: TOTAL KNEE ARTHROPLASTY;  Surgeon: Evalene JONETTA Chancy, MD;  Location: [facility] OR;  Service: Orthopedics;  Laterality: Right;   TOTAL KNEE ARTHROPLASTY  Left 10/17/2021   Procedure: Left TOTAL KNEE ARTHROPLASTY;  Surgeon: Vernetta Lonni GRADE, MD;  Location: [facility] OR;  Service: Orthopedics;  Laterality: Left;    reports that he quit smoking about 16 years ago. His smoking use included cigarettes. He started smoking about 47 years ago. He has a 60 pack-year smoking history. He has never used smokeless tobacco. He reports that he does not drink alcohol and does not use drugs. family history includes Allergies in his mother; Asthma in his maternal grandmother and mother; COPD in his mother; Colon polyps in his mother; Hypothyroidism in his mother. Allergies[1] Medications Ordered Prior to Encounter[2]   Observations/Objective: Alert, NAD, appropriate mood and affect, resps normal, cn 2-12 intact, moves all 4s, no visible rash or swelling Lab Results  Component Value Date   WBC 14.8 (H) 05/20/2024   HGB 11.0 (L) 05/20/2024  HCT 35.1 (L) 05/20/2024   PLT 284 05/20/2024   GLUCOSE 87 05/28/2024   CHOL 130 01/07/2024   TRIG 139.0 01/07/2024   HDL 52.40 01/07/2024   LDLDIRECT 72.0 02/28/2021   LDLCALC 49 01/07/2024   ALT 18 05/04/2024   AST 27 05/04/2024   NA 139 05/28/2024   K 4.3 05/28/2024   CL 101 05/28/2024   CREATININE 1.07 05/28/2024   BUN 26 (H) 05/28/2024   CO2 30 05/28/2024   TSH 2.02 01/07/2024   PSA 3.46 01/07/2024   INR 1.3 (H) 05/04/2024   HGBA1C 6.0 01/07/2024   MICROALBUR <0.7 01/07/2024   Assessment and Plan: See notes  Follow Up Instructions: See notes   I discussed the assessment and treatment plan with the patient. The patient was provided an opportunity to ask questions and all were answered. The patient agreed with the plan and demonstrated an understanding of the instructions.   The patient was advised to call back or seek an in-person evaluation if the symptoms worsen or if the condition fails to improve as anticipated.   Lynwood Rush, MD     [1]  Allergies Allergen Reactions   Amiodarone  Other (See  Comments)    Caused hyperthyroidism    Statins Other (See Comments)    Myalgias    Sulfa  Antibiotics Other (See Comments)    Mouth ulcers   Wound Dressing Adhesive Other (See Comments)    Skin Tears Use Paper Tape Only  [2]  Current Outpatient Medications on File Prior to Visit  Medication Sig Dispense Refill   acetaminophen  (TYLENOL ) 325 MG tablet Take 2 tablets (650 mg total) by mouth every 6 (six) hours as needed for fever or mild pain (pain score 1-3).     albuterol  (VENTOLIN  HFA) 108 (90 Base) MCG/ACT inhaler INHALE 1-2 PUFFS BY MOUTH EVERY 6 HOURS AS NEEDED FOR WHEEZE OR SHORTNESS OF BREATH 8.5 each 5   ALPRAZolam  (XANAX ) 0.5 MG tablet TAKE 2 TABLETS (1 MG TOTAL) BY MOUTH EVERY DAY AT BEDTIME 60 tablet 2   apixaban  (ELIQUIS ) 5 MG TABS tablet Take 1 tablet (5 mg total) by mouth 2 (two) times daily. 60 tablet 11   ascorbic acid (VITAMIN C) 500 MG tablet Take 500 mg by mouth daily.     augmented betamethasone  dipropionate (DIPROLENE -AF) 0.05 % cream Apply 1 application  topically daily as needed (to affected areas- for itching).     cephALEXin  (KEFLEX ) 500 MG capsule Take 1 capsule (500 mg total) by mouth 3 (three) times daily. (Patient not taking: Reported on 09/17/2024) 30 capsule 0   Cholecalciferol  (VITAMIN D3) 50 MCG (2000 UT) TABS Take 2,000 Units by mouth daily.     Cyanocobalamin  (VITAMIN B-12) 2500 MCG SUBL Place 2,500 mcg under the tongue daily.     doxepin  (SINEQUAN ) 10 MG capsule Take 2 capsules (20 mg total) by mouth at bedtime. 180 capsule 1   DULoxetine  (CYMBALTA ) 60 MG capsule Take 1 capsule (60 mg total) by mouth daily. 90 capsule 0   Dupilumab (DUPIXENT) 300 MG/2ML SOPN Inject 300 mg into the skin every 14 (fourteen) days.     eplerenone  (INSPRA ) 25 MG tablet Take 25 mg by mouth daily.     esomeprazole  (NEXIUM ) 40 MG capsule Take 1 capsule (40 mg total) by mouth daily at 12 noon. 30 capsule 2   Evolocumab  (REPATHA  SURECLICK) 140 MG/ML SOAJ INJECT 140 MG INTO THE SKIN  EVERY 14 DAYS 6 mL 3   fluticasone  (FLONASE ) 50 MCG/ACT nasal spray  Place 2 sprays into both nostrils daily as needed for allergies or rhinitis. 48 mL 1   gabapentin  (NEURONTIN ) 100 MG capsule TAKE 1 CAPSULE (100 MG TOTAL) BY MOUTH THREE TIMES DAILY. 90 capsule 3   hydrOXYzine  (VISTARIL ) 100 MG capsule Take 1 capsule (100 mg total) by mouth 3 (three) times daily as needed for itching. 90 capsule 2   ipratropium-albuterol  (DUONEB) 0.5-2.5 (3) MG/3ML SOLN TAKE 3 ML BY NEBULIZATION EVERY 4 HOURS AS NEEDED (WHEEZING). 360 mL 0   lidocaine  (LIDODERM ) 5 % PLACE 1 PATCH ONTO THE SKIN DAILY. REMOVE & DISCARD PATCH WITHIN 12 HOURS OR AS DIRECTED BY MD 30 patch 2   linaclotide  (LINZESS ) 72 MCG capsule Take 1 capsule (72 mcg total) by mouth daily before breakfast. 90 capsule 3   losartan  (COZAAR ) 25 MG tablet Take 25 mg by mouth daily.     magnesium  hydroxide (MILK OF MAGNESIA) 400 MG/5ML suspension Take 5 mLs by mouth daily as needed for mild constipation. 355 mL 0   meclizine  (ANTIVERT ) 25 MG tablet Take 1 tablet (25 mg total) by mouth 3 (three) times daily as needed for dizziness. 30 tablet 0   Multiple Vitamins-Minerals (SENIOR VITES PO) Take 1 tablet by mouth daily with breakfast.     omeprazole  (PRILOSEC) 20 MG capsule TAKE 1 CAPSULE BY MOUTH TWICE A DAY 180 capsule 1   oxyCODONE -acetaminophen  (PERCOCET) 5-325 MG tablet Take 1 tablet by mouth every 6 (six) hours as needed for severe pain (pain score 7-10). (Patient not taking: Reported on 09/17/2024) 20 tablet 0   predniSONE  (DELTASONE ) 10 MG tablet 3 tabs by mouth per day for 3 days,2tabs per day for 3 days,1tab per day for 3 days 18 tablet 0   predniSONE  (DELTASONE ) 10 MG tablet Take 1 tablet (10 mg total) by mouth daily with breakfast. 90 tablet 0   tamsulosin  (FLOMAX ) 0.4 MG CAPS capsule Take 1 capsule (0.4 mg total) by mouth daily. 30 capsule 5   Tiotropium Bromide  Monohydrate (SPIRIVA  RESPIMAT) 2.5 MCG/ACT AERS Take 2 puffs by nebulization daily. 4 g  3   tirzepatide  (MOUNJARO ) 10 MG/0.5ML Pen Inject 10 mg into the skin once a week. 6 mL 3   tiZANidine  (ZANAFLEX ) 4 MG tablet TAKE 1 TABLET BY MOUTH 3 TIMES DAILY. 270 tablet 2   torsemide  (DEMADEX ) 20 MG tablet Take 4 tablets (80 mg total) by mouth 2 (two) times daily. 720 tablet 2   traZODone  (DESYREL ) 100 MG tablet TAKE 2 TABLETS BY MOUTH AT BEDTIME AS NEEDED (Patient taking differently: Take 200 mg by mouth at bedtime.) 180 tablet 1   TUMS 500 MG chewable tablet Chew 1 tablet by mouth 2 (two) times daily as needed for indigestion or heartburn.     valACYclovir  (VALTREX ) 1000 MG tablet Take 1,000 mg by mouth 3 (three) times daily as needed (Outbreaks).     venlafaxine  XR (EFFEXOR -XR) 150 MG 24 hr capsule TAKE 1 CAPSULE BY MOUTH DAILY WITH BREAKFAST. 90 capsule 3   WIXELA INHUB 250-50 MCG/ACT AEPB INHALE 1 PUFF INTO THE LUNGS IN THE MORNING AND AT BEDTIME. 60 each 8   zolpidem  (AMBIEN ) 5 MG tablet Take 1 tablet (5 mg total) by mouth at bedtime as needed for sleep. 30 tablet 5   No current facility-administered medications on file prior to visit.

## 2024-10-06 NOTE — Assessment & Plan Note (Signed)
 Last vitamin D  Lab Results  Component Value Date   VD25OH 29.38 (L) 01/07/2024   Low, to start  oral replacement

## 2024-10-06 NOTE — Assessment & Plan Note (Signed)
 With intermittent sharp abd pain mild to mod, pt advised fentanyl  patch not likely appropriate, but will add toradol  10 mg tqd prn, and dycyclomine 10 mg tid prn, also refilled zofran  which works well.  Pt to f/u GI as planned

## 2024-10-09 ENCOUNTER — Ambulatory Visit: Payer: Self-pay | Admitting: Student in an Organized Health Care Education/Training Program

## 2024-10-09 ENCOUNTER — Telehealth: Payer: Self-pay

## 2024-10-09 NOTE — Telephone Encounter (Signed)
 Alert remote transmission: RRT reached on 12/25.  To triage. Possible OptiVol fluid accumulation: 28-Aug-2024 -- ongoing PATIENT IS DEPEDENT  Spoke with patient.  He denies any swelling but does feel more SOB at times.  He sees Dr. Rolan on 10/21/24 and knows to call our office if symptoms progress in interim or go to ER if severe.   Discussed RRT.  He cannot come to clinic sooner than 11/26/24 due to transportation issues.  He will see Charlies Arthur PA-C at 335pm that day to discuss gen change and get scheduled for procedure.   Dr. Almetta will  be performing gen change and following patient moving forward.

## 2024-10-10 ENCOUNTER — Other Ambulatory Visit: Payer: Self-pay | Admitting: Internal Medicine

## 2024-10-11 ENCOUNTER — Other Ambulatory Visit (HOSPITAL_COMMUNITY): Payer: Self-pay | Admitting: Cardiology

## 2024-10-12 ENCOUNTER — Telehealth: Payer: Self-pay | Admitting: Student in an Organized Health Care Education/Training Program

## 2024-10-12 ENCOUNTER — Other Ambulatory Visit: Payer: Self-pay

## 2024-10-12 NOTE — Telephone Encounter (Signed)
 Patient's device is at [facility].  Explained to him last week this would happen and to call us  if he would like to come in and have us  turn the alert off.  Otherwise, it will keep alarming once daily.  This is normal function once device reaches ERI.   Patient says Dr. Almetta also told him this would happen as well, he just wanted us  to know.   He does not want to come in to have it turned off.  He is okay with the daily alert sound.

## 2024-10-12 NOTE — Telephone Encounter (Signed)
 Patient stated his device has been going off at 7:00 am on  Friday, Saturday, Sunday and Monday.  Patient and wants advice on next steps.

## 2024-10-13 ENCOUNTER — Telehealth: Payer: Self-pay

## 2024-10-13 ENCOUNTER — Other Ambulatory Visit: Payer: Self-pay

## 2024-10-13 ENCOUNTER — Ambulatory Visit

## 2024-10-13 DIAGNOSIS — I429 Cardiomyopathy, unspecified: Secondary | ICD-10-CM

## 2024-10-13 DIAGNOSIS — Z95810 Presence of automatic (implantable) cardiac defibrillator: Secondary | ICD-10-CM

## 2024-10-13 NOTE — Progress Notes (Signed)
 Orders placed for BiV gen change scheduled for 10/26/2024.

## 2024-10-13 NOTE — Telephone Encounter (Signed)
-----   Message from Nurse Aldona SAUNDERS, RN sent at 10/13/2024  9:43 AM EST ----- Regarding: 10/26/24 BiV ICD gen change Almetta Important: list procedure date as first item in subject line, followed by procedure type (e.g., 06/26/24 PPM implant)  Precert:  MD: Almetta Type of implant: CRT-D Device manufacturer: [employer] Diagnosis: ERI CPT code: CRT-D change out (no LV lead change) - 33264 C-code(s), including quantity (if indicated):  Procedure scheduled (date/time): 10/26/24 130pm  Procedure:  Scrub given? No  - Orange dial soap Medication instructions: hold your Eliquis  (Apixaban ) for 2 day(s) prior to your procedure. Your last dose will be Saturday, January 10, PM dose.  Monjaro x 1 week Message sent to CVRR? N/a Added to calendar? Yes Orders entered? Yes Letter complete? Yes Scheduled with cath lab? Yes Labs ordered (CBC, BMET, PT/INR if on warfarin)? Yes - day of due to pt's transportation issue Dye allergy ? No Pre-meds ordered and instructions given? N/A Letter method: MyChart - attempted to call pt to review Special instructions: no H&P: day of procedure  Follow-up:  Cassie/Angel, please schedule Routine.  Covering RN:  Please send this message to [employer], EP scheduler, EP Scheduling pool, and EP [employer].

## 2024-10-13 NOTE — Telephone Encounter (Signed)
 Attempted phone call to pt to review generator change instruction letter.  Left voicemail message to contact office at [phone].  See letter for complete instructions.  Letter released to pt's MyChart.

## 2024-10-14 ENCOUNTER — Encounter (HOSPITAL_BASED_OUTPATIENT_CLINIC_OR_DEPARTMENT_OTHER): Admitting: General Surgery

## 2024-10-14 NOTE — Progress Notes (Signed)
 No ICM remote transmission received for 10/12/2024 and next ICM transmission scheduled for 11/30/2024.  Device battery replacement scheduled for 10/26/2024. Will take apporximately 4-5 weeks for Optivol to develop normal baseline.

## 2024-10-16 ENCOUNTER — Telehealth: Payer: Self-pay | Admitting: Student in an Organized Health Care Education/Training Program

## 2024-10-16 NOTE — Telephone Encounter (Signed)
 Pt would like a call back regarding instructions for upcoming procedure, please advise.

## 2024-10-19 NOTE — Telephone Encounter (Signed)
 Pt returning nurse call

## 2024-10-19 NOTE — Telephone Encounter (Signed)
 Spoke with the patient and reviewed instructions for his upcoming procedure. Patient verbalized understanding and has no further questions at this time.

## 2024-10-19 NOTE — Telephone Encounter (Signed)
 Left message for patient to call back

## 2024-10-20 ENCOUNTER — Encounter (HOSPITAL_COMMUNITY): Payer: Self-pay

## 2024-10-21 ENCOUNTER — Encounter (HOSPITAL_COMMUNITY): Payer: Self-pay

## 2024-10-21 ENCOUNTER — Ambulatory Visit

## 2024-10-21 ENCOUNTER — Ambulatory Visit (HOSPITAL_COMMUNITY)
Admission: RE | Admit: 2024-10-21 | Discharge: 2024-10-21 | Disposition: A | Discharge status: Home / Self Care | Source: Ambulatory Visit | Attending: Adult Health | Admitting: Adult Health

## 2024-10-21 ENCOUNTER — Ambulatory Visit (HOSPITAL_COMMUNITY): Payer: Self-pay | Admitting: Family Medicine

## 2024-10-21 ENCOUNTER — Encounter

## 2024-10-21 VITALS — BP 110/71 | HR 75 | Ht 70.0 in | Wt 232.0 lb

## 2024-10-21 DIAGNOSIS — G4733 Obstructive sleep apnea (adult) (pediatric): Secondary | ICD-10-CM | POA: Diagnosis not present

## 2024-10-21 DIAGNOSIS — I442 Atrioventricular block, complete: Secondary | ICD-10-CM | POA: Diagnosis not present

## 2024-10-21 DIAGNOSIS — Z79899 Other long term (current) drug therapy: Secondary | ICD-10-CM | POA: Insufficient documentation

## 2024-10-21 DIAGNOSIS — I5022 Chronic systolic (congestive) heart failure: Secondary | ICD-10-CM

## 2024-10-21 DIAGNOSIS — I2510 Atherosclerotic heart disease of native coronary artery without angina pectoris: Secondary | ICD-10-CM | POA: Insufficient documentation

## 2024-10-21 DIAGNOSIS — Z9981 Dependence on supplemental oxygen: Secondary | ICD-10-CM | POA: Diagnosis not present

## 2024-10-21 DIAGNOSIS — R0602 Shortness of breath: Secondary | ICD-10-CM | POA: Diagnosis present

## 2024-10-21 DIAGNOSIS — Z7901 Long term (current) use of anticoagulants: Secondary | ICD-10-CM | POA: Diagnosis not present

## 2024-10-21 DIAGNOSIS — Z87891 Personal history of nicotine dependence: Secondary | ICD-10-CM | POA: Diagnosis not present

## 2024-10-21 DIAGNOSIS — I4821 Permanent atrial fibrillation: Secondary | ICD-10-CM | POA: Insufficient documentation

## 2024-10-21 DIAGNOSIS — I428 Other cardiomyopathies: Secondary | ICD-10-CM | POA: Diagnosis not present

## 2024-10-21 DIAGNOSIS — J449 Chronic obstructive pulmonary disease, unspecified: Secondary | ICD-10-CM | POA: Diagnosis not present

## 2024-10-21 DIAGNOSIS — Z95810 Presence of automatic (implantable) cardiac defibrillator: Secondary | ICD-10-CM | POA: Insufficient documentation

## 2024-10-21 LAB — BASIC METABOLIC PANEL WITH GFR
Anion gap: 8 (ref 5–15)
BUN: 36 mg/dL — ABNORMAL HIGH (ref 8–23)
CO2: 32 mmol/L (ref 22–32)
Calcium: 9.3 mg/dL (ref 8.9–10.3)
Chloride: 99 mmol/L (ref 98–111)
Creatinine, Ser: 1.08 mg/dL (ref 0.61–1.24)
GFR, Estimated: >60 mL/min
Glucose, Bld: 91 mg/dL (ref 70–99)
Potassium: 4.3 mmol/L (ref 3.5–5.1)
Sodium: 139 mmol/L (ref 135–145)

## 2024-10-21 LAB — PRO BRAIN NATRIURETIC PEPTIDE: Pro Brain Natriuretic Peptide: 222 pg/mL

## 2024-10-21 NOTE — Patient Instructions (Signed)
 Lab Work: Labs done today, your results will be available in MyChart, we will contact you for abnormal readings.  Follow-Up in: 3 MONTHS WITH DR. ROLAN PLEASE CALL OUR OFFICE AROUND FEBRUARY TO GET SCHEDULED FOR YOUR APPOINTMENT. PHONE NUMBER IS [phone] OPTION 2   At the [facility], you and your health needs are our priority. We have a designated team specialized in the treatment of Heart Failure. This Care Team includes your primary Heart Failure Specialized Cardiologist (physician), Advanced Practice Providers (APPs- Physician Assistants and Nurse Practitioners), and Pharmacist who all work together to provide you with the care you need, when you need it.   You may see any of the following providers on your designated Care Team at your next follow up:  Dr. Toribio Fuel Dr. Ezra Rolan Dr. Odis Brownie Greig Mosses, NP Caffie Shed, [place] [facility] [place], [place] Beckey Coe, NP Jordan Lee, NP Tinnie Redman, PharmD   Please be sure to bring in all your medications bottles to every appointment.   Need to Contact Us :  If you have any questions or concerns before your next appointment please send us  a message through [place] or call our office at [phone].    TO LEAVE A MESSAGE FOR THE NURSE SELECT OPTION 2, PLEASE LEAVE A MESSAGE INCLUDING: YOUR NAME DATE OF BIRTH CALL BACK NUMBER REASON FOR CALL**this is important as we prioritize the call backs  YOU WILL RECEIVE A CALL BACK THE SAME DAY AS LONG AS YOU CALL BEFORE 4:00 PM

## 2024-10-21 NOTE — Progress Notes (Signed)
 "  ADVANCED HF CLINIC NOTE   ID:  [name], DOB 09/10/1956, MRN [id number]  Provider location: [facility] Type of Visit: Established patient  PCP:  Norleen Lynwood ORN, MD  Cardiologist:  Dr. Rolan   HPI: [name] is a 69 y.o. male who has history of COPD on home oxygen , permanent atrial fibrillation, and chronic systolic CHF.  Amiodarone  and DCCV were tried for atrial fibrillation in the past without success.   Patient has been noted to have a low EF since 2013.  EF improved to normal by 2015 but dropped to 30-35% by 11/16.  Cath in 6/17 showed some CAD but not enough to cause cardiomyopathy.  He was admitted in 3/18 with PNA, atrial fibrillation/RVR, and acute on chronic systolic CHF.  Echo in 3/18 showed fall in EF to 10-15%.   Underwent right and left heart cath in 4/18, which showed nonobstructive CAD and relatively optimized filling pressures.    Syncope in 11/18. Loop recorder showed 6 second pause and periods of complete heart block. He had Medtronic BiV ICD placed in 12/18.  In 1/19, he had AV nodal ablation to promote BiV pacing.   Echo was done 6/19 with EF 25-30% with moderate RV dilation/mildly decreased systolic function. CPX 7/19 was submaximal, probably mild functional impairment.    Echo in 1/21 showed EF 35-40% with diffuse hypokinesis, moderate RV dilation with mildly decreased systolic function.   Admitted with COVID-19 PNA in 2/19.  He had AKI/hypotension and Entresto  was stopped.   Echo in 3/22 showed EF 40% with diffuse hypokinesis, mild RV enlargement, mildly decreased RV systolic function.   Presented to [facility] 7/22 with weakness and dizziness. Found to be in hemorrhagic shock from acute GIB. Hgb 5.0, INR 8.4. He was on Coumadin  for atrial fibrillation. Resuscitated with PRBCs, IVF, and given Vitamin K . GDMT held d/t hypotension. Initially required ICU due to hypotension requiring pressors. GI consulted and planned to treat with oral  antibiotics for suspected diverticular bleed with plans for outpatient colonoscopy.  Coumadin  was transitioned to Eliquis .  Discharged back on beta blocker, Entresto  and SGLT2i. Dig, spiro and torsemide  were held at discharge. Weight 259 lbs.  S/p TKR 1/23  Follow up 12/23, NYHA II-early III, volume overloaded and torsemide  increased to 80 bid.   Echo 1/24 EF 40%, RV normal, no significant change from prior.  Admitted 2/24 w/ RLE cellulitis, failed outpatient abx. Received IV eventually transitioned to oral cefadroxil . Followed by ID, saw Dr. Luiz 12/18/22 and arranged for CT of RLE to rule out deep tissue abscess. Finished abx course.  Admitted 4/25 with hypovolemic shock 2/2 diarrhea. Required ICU care and vasopressors. Qualified for O2 at discharge, weight 274 lbs.  Admitted 6/25 with unintentional muscle relaxer OD, also with concerns for sepsis 2/2 diverticulitis. GDMT stopped with hypotension. Given IVF and abx, GI rec outpatient colonoscopy. He was discharged to SNF.  Seen in ED 05/05/24 with syncope. Felt to by 2/2 to vertigo. Discharged back to SNF.  Today he returns for HF follow up with his girl friend. Overall feeling fair. He has SOB walking with walker walking around his house. Having diarrhea and appetite poor. Denies palpitations, abnormal bleeding, CP, dizziness, edema, or PND/Orthopnea. Chronically sleeps in recliner. Wears 2L oxygen  continuously. Weight at home 232 pounds. Taking all medications. Planning ICD gen change next week.  MDT device interrogation (personally reviewed): OptiVol up, thoracic impedence just below reference line, 0.6 hr/day activity, no AF or VT,  99.3% BiV pacing  ECG (personally reviewed):  none ordered today.  Labs (3/25): LDL 49 Labs (7/25): K 3.3, creatinine 0.92, normal LFTs Labs (8/25): K 4.3, creatinine 1.07  PMH: 1. Asthma 2. COPD: On home oxygen . Prior smoker.  3. Atrial fibrillation: Permanent.  He failed DCCV and amiodarone  in the past  . 4. HTN 5. GERD 6. Hyperlipidemia: Myalgias with atorvastatin  7. BPH.  8. Chronic systolic CHF: Nonischemic cardiomyopathy.  - EF 25% by TEE in 7/13.  Normalized on 2015 echo. - Echo (11/16): EF 30-35%.  - LHC/RHC (6/17): 80% ostial stenosis small D1, 30-40% pLAD.  Mean RA 4, PA 26/10, mean PCWP 11, CI 2.33.  - Echo (3/18): EF 10-15%, diffuse hypokinesis, severe LV dilation, moderate RV dilation with severely decreased systolic function.  - LHC/RHC (4/18): 40% proximal LAD.  Mean RA 8, PA 37/10 mean 22, mean PCWP 22, LVEDP 9, CI 2.39. - Medtronic BiV ICD placement in 12/18 with AV nodal ablation to promote BiV pacing in 1/19.  - Echo (6/19): EF 25-30% with mild LV dilation, moderately dilated RV with mildly decreased systolic function, IVC not dilated, PASP 23 mmHg.  - CPX (7/19): peak VO2 17.6 (78% predicted), VE/VCO2 slope 31, RER 1.02 => submaximal, probably mild functional impairment.  - Echo (1/21): EF 35-40% with diffuse hypokinesis, moderate RV dilation with mildly decreased systolic function. - Echo (3/22): EF 40% with diffuse hypokinesis, mild RV enlargement, mildly decreased RV systolic function.  - Echo (1/24): EF 40%, RV normal - Echo (1/25): EF 40-45%, mild aortic stenosis 9. Morbid obesity 10. ILR in place.  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 Complete heart block: s/p Medtronic CRT-D.  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 OSA: Untreated, unable to tolerate CPAP.  15. COVID-19 PNA 2/21.  16. Hyperlipidemia: Myalgias with multiple statins.  17. PAD: ABIs 10/21 noncompressible on right but TBI normal, normal ABI and TBI on left.  18. GI bleeding: 7/22, diverticulosis.  19. Left TKR (1/23)  Social History   Socioeconomic History   Marital status: Divorced    Spouse name: Not on file   Number of children: 2   Years of education: Not on file   Highest education level: GED or equivalent  Occupational History   Occupation: retired/disabled. prev worked in agricultural engineer.    Employer: DISABLED  Tobacco Use   Smoking status: Former    Current packs/day: 0.00    Average packs/day: 2.0 packs/day for 30.0 years (60.0 ttl pk-yrs)    Types: Cigarettes    Start date: 10/15/1977    Quit date: 10/16/2007    Years since quitting: 17.0   Smokeless tobacco: Never  Vaping Use   Vaping status: Never Used  Substance and Sexual Activity   Alcohol use: No   Drug use: No   Sexual activity: Not Currently  Other Topics Concern   Not on file  Social History Narrative   Lives with girlfriend   Social Drivers of Health   Tobacco Use: Medium Risk (10/21/2024)   Patient History    Smoking Tobacco Use: Former    Smokeless Tobacco Use: Never    Passive Exposure: Not on file  Financial Resource Strain: Medium Risk (01/01/2024)   Overall Financial Resource Strain (CARDIA)    Difficulty of Paying Living Expenses: Somewhat hard  Food Insecurity: No Food Insecurity (09/17/2024)   Epic    Worried About Radiation Protection Practitioner of Food in the Last Year: Never true    Ran Out of Food in the Last Year: Never  true  Transportation Needs: No Transportation Needs (04/13/2024)   Epic    Lack of Transportation (Medical): No    Lack of Transportation (Non-Medical): No  Physical Activity: Inactive (09/17/2024)   Exercise Vital Sign    Days of Exercise per Week: 0 days    Minutes of Exercise per Session: 0 min  Stress: No Stress Concern Present (09/17/2024)   [employer] of Occupational Health - Occupational Stress Questionnaire    Feeling of Stress: Only a little  Social Connections: Socially Isolated (09/17/2024)   Social Connection and Isolation Panel    Frequency of Communication with Friends and Family: Once a week    Frequency of Social Gatherings with Friends and Family: Never    Attends Religious Services: Never    Database Administrator or Organizations: No    Attends Banker Meetings: Never    Marital Status: Divorced  Catering Manager Violence: Not At Risk  (04/13/2024)   Epic    Fear of Current or Ex-Partner: No    Emotionally Abused: No    Physically Abused: No    Sexually Abused: No  Depression (PHQ2-9): Low Risk (09/17/2024)   Depression (PHQ2-9)    PHQ-2 Score: 0  Alcohol Screen: Low Risk (09/16/2023)   Alcohol Screen    Last Alcohol Screening Score (AUDIT): 0  Housing: Low Risk (09/17/2024)   Epic    Unable to Pay for Housing in the Last Year: No    Number of Times Moved in the Last Year: 1    Homeless in the Last Year: No  Utilities: Not At Risk (04/13/2024)   Epic    Threatened with loss of utilities: No  Health Literacy: Adequate Health Literacy (09/17/2024)   B1300 Health Literacy    Frequency of need for help with medical instructions: Never   Family History  Problem Relation Age of Onset   COPD Mother    Asthma Mother    Colon polyps Mother    Allergies Mother    Hypothyroidism Mother    Asthma Maternal Grandmother    Colon cancer Neg Hx    ROS: All systems reviewed and negative except as per HPI.   Current Outpatient Medications  Medication Sig Dispense Refill   acetaminophen  (TYLENOL ) 325 MG tablet Take 2 tablets (650 mg total) by mouth every 6 (six) hours as needed for fever or mild pain (pain score 1-3).     albuterol  (VENTOLIN  HFA) 108 (90 Base) MCG/ACT inhaler INHALE 1-2 PUFFS BY MOUTH EVERY 6 HOURS AS NEEDED FOR WHEEZE OR SHORTNESS OF BREATH 8.5 each 5   ALPRAZolam  (XANAX ) 0.5 MG tablet TAKE 2 TABLETS (1 MG TOTAL) BY MOUTH EVERY DAY AT BEDTIME 60 tablet 2   apixaban  (ELIQUIS ) 5 MG TABS tablet Take 1 tablet (5 mg total) by mouth 2 (two) times daily. 60 tablet 11   ascorbic acid (VITAMIN C) 500 MG tablet Take 500 mg by mouth daily.     augmented betamethasone  dipropionate (DIPROLENE -AF) 0.05 % cream Apply 1 application  topically daily as needed (to affected areas- for itching).     carvedilol  (COREG ) 3.125 MG tablet Take 1 tablet (3.125 mg total) by mouth 2 (two) times daily. PLEASE SCHEDULE APPOINTMENT FOR MORE  REFILLS [phone] OPTION 2 180 tablet 0   dicyclomine  (BENTYL ) 10 MG capsule Take 1 capsule (10 mg total) by mouth 4 (four) times daily -  before meals and at bedtime. 90 capsule 2   doxepin  (SINEQUAN ) 10 MG capsule TAKE 2 CAPSULES  BY MOUTH AT BEDTIME. 180 capsule 1   DULoxetine  (CYMBALTA ) 60 MG capsule Take 1 capsule (60 mg total) by mouth daily. 90 capsule 0   Dupilumab (DUPIXENT) 300 MG/2ML SOPN Inject 300 mg into the skin every 14 (fourteen) days.     eplerenone  (INSPRA ) 25 MG tablet Take 25 mg by mouth daily.     esomeprazole  (NEXIUM ) 40 MG capsule Take 1 capsule (40 mg total) by mouth daily at 12 noon. 30 capsule 2   Evolocumab  (REPATHA  SURECLICK) 140 MG/ML SOAJ INJECT 140 MG INTO THE SKIN EVERY 14 DAYS 6 mL 3   fluticasone  (FLONASE ) 50 MCG/ACT nasal spray Place 2 sprays into both nostrils daily as needed for allergies or rhinitis. 48 mL 1   gabapentin  (NEURONTIN ) 100 MG capsule TAKE 1 CAPSULE (100 MG TOTAL) BY MOUTH THREE TIMES DAILY. 90 capsule 3   ibuprofen  (ADVIL ) 200 MG tablet Take 400-800 mg by mouth every 8 (eight) hours as needed for moderate pain (pain score 4-6).     ipratropium-albuterol  (DUONEB) 0.5-2.5 (3) MG/3ML SOLN TAKE 3 ML BY NEBULIZATION EVERY 4 HOURS AS NEEDED (WHEEZING). 360 mL 0   lidocaine  (LIDODERM ) 5 % PLACE 1 PATCH ONTO THE SKIN DAILY. REMOVE & DISCARD PATCH WITHIN 12 HOURS OR AS DIRECTED BY MD 30 patch 2   linaclotide  (LINZESS ) 72 MCG capsule Take 1 capsule (72 mcg total) by mouth daily before breakfast. 90 capsule 3   losartan  (COZAAR ) 25 MG tablet Take 25 mg by mouth daily.     magnesium  hydroxide (MILK OF MAGNESIA) 400 MG/5ML suspension Take 5 mLs by mouth daily as needed for mild constipation. 355 mL 0   meclizine  (ANTIVERT ) 25 MG tablet Take 1 tablet (25 mg total) by mouth 3 (three) times daily as needed for dizziness. 30 tablet 0   Multiple Vitamins-Minerals (SENIOR VITES PO) Take 1 tablet by mouth daily with breakfast.     omeprazole  (PRILOSEC) 20 MG  capsule TAKE 1 CAPSULE BY MOUTH TWICE A DAY 180 capsule 1   ondansetron  (ZOFRAN -ODT) 4 MG disintegrating tablet Take 1 tablet (4 mg total) by mouth every 8 (eight) hours as needed. 30 tablet 1   predniSONE  (DELTASONE ) 10 MG tablet Take 1 tablet (10 mg total) by mouth daily with breakfast. 90 tablet 0   tamsulosin  (FLOMAX ) 0.4 MG CAPS capsule Take 1 capsule (0.4 mg total) by mouth daily. 30 capsule 5   Tiotropium Bromide  Monohydrate (SPIRIVA  RESPIMAT) 2.5 MCG/ACT AERS Take 2 puffs by nebulization daily. 4 g 3   tirzepatide  (MOUNJARO ) 10 MG/0.5ML Pen Inject 10 mg into the skin once a week. 6 mL 3   tiZANidine  (ZANAFLEX ) 4 MG tablet TAKE 1 TABLET BY MOUTH 3 TIMES DAILY. 270 tablet 2   torsemide  (DEMADEX ) 20 MG tablet Take 4 tablets (80 mg total) by mouth 2 (two) times daily. (Patient taking differently: Take 80 mg by mouth daily.) 720 tablet 2   traZODone  (DESYREL ) 100 MG tablet TAKE 2 TABLETS BY MOUTH AT BEDTIME AS NEEDED 180 tablet 1   TUMS 500 MG chewable tablet Chew 1 tablet by mouth 2 (two) times daily as needed for indigestion or heartburn.     valACYclovir  (VALTREX ) 1000 MG tablet Take 1,000 mg by mouth 3 (three) times daily as needed (Outbreaks).     venlafaxine  XR (EFFEXOR -XR) 150 MG 24 hr capsule TAKE 1 CAPSULE BY MOUTH DAILY WITH BREAKFAST. 90 capsule 3   WIXELA INHUB 250-50 MCG/ACT AEPB INHALE 1 PUFF INTO THE LUNGS IN THE MORNING AND  AT BEDTIME. 60 each 8   zolpidem  (AMBIEN ) 5 MG tablet Take 1 tablet (5 mg total) by mouth at bedtime as needed for sleep. 30 tablet 5   Cholecalciferol  (VITAMIN D3) 50 MCG (2000 UT) TABS Take 2,000 Units by mouth daily. (Patient not taking: Reported on 10/21/2024)     Cyanocobalamin  (VITAMIN B-12) 2500 MCG SUBL Place 2,500 mcg under the tongue 2 (two) times a week. (Patient not taking: Reported on 10/21/2024)     hydrOXYzine  (VISTARIL ) 100 MG capsule Take 1 capsule (100 mg total) by mouth 3 (three) times daily as needed for itching. (Patient not taking: Reported on  10/21/2024) 90 capsule 2   oxyCODONE -acetaminophen  (PERCOCET) 5-325 MG tablet Take 1 tablet by mouth every 6 (six) hours as needed for severe pain (pain score 7-10). (Patient not taking: Reported on 10/21/2024) 20 tablet 0   No current facility-administered medications for this encounter.   Wt Readings from Last 3 Encounters:  10/21/24 105.2 kg (232 lb)  09/17/24 108.9 kg (240 lb)  08/19/24 108.9 kg (240 lb)   BP 110/71   Pulse 75   Ht 5' 10 (1.778 m)   Wt 105.2 kg (232 lb)   SpO2 94%   BMI 33.29 kg/m   PHYSICAL EXAM: General:  NAD. No resp difficulty, walked into clinic with RW on oxygen , chronically-ill appearing, kyphotic HEENT: Normal Neck: Supple. No JVD. Thick neck Cor: Regular rate & rhythm. No rubs, gallops or murmurs. Lungs: Clear, diminished in bases Abdomen: Soft, obese, nontender, nondistended.  Extremities: No cyanosis, clubbing, rash, trace LLE edema; chronic venous stasis change to BLE Neuro: Alert & oriented x 3, moves all 4 extremities w/o difficulty. Affect pleasant.  Assessment/Plan:  1. Atrial fibrillation: Permanent.  Now s/p AV nodal ablation to allow effective BiV pacing.  - Continue Eliquis  5 mg bid. No bleeding issues.  2. COPD: No longer smoking.  I think that COPD plays a major role in his dyspnea.  - Continue oxygen  - Followed by Dr. Kassie  3. Chronic systolic CHF: Nonischemic cardiomyopathy based on 6/17 cath. However, he had a recent significant fall in EF from 30-35% to 10-15% on echo from 3/18.  LHC/RHC in 4/18 showed nonobstructive CAD and relatively optimized filling pressures.  He now has Medtronic CRT-D device with AV nodal ablation to allow BiV pacing.  Echo 6/19 with EF 25-30% with mildly decreased RV systolic function. CPX 7/19 was submaximal but likely only mild functional limitation from CHF.  Echo in 1/21 showed EF 35-40%, echo in 3/22 and again in 1/24 showed EF up to 40% with mildly decreased RV systolic function.  Echo 1/25 showed EF  40-45%, mild aortic stenosis. NYHA III chronically, confounded by body habitus, COPD, and physical deconditioning. He does not appear volume overloaded on exam, weight is down 8 lbs, but OptiVol suggests mild volume overload. - With diarrhea, will check pro-BNP to better guide diuretic dosing. - Continue torsemide  80 mg bid. BMET today. - Continue Coreg  3.125 mg bid. - Continue eplerenone  25 mg daily. - Continue losartan  25 mg daily. - Followed closely by device RN in iCM.  4. CAD: Nonobstructive on 4/18 cath.  No exertional chest pain, rare atypical chest pain.  - No ASA with Eliquis  use. - Statin myalgias, continue Repatha . Last LDL 49 5. Complete heart block: Now has Medtronic CRT-D device and s/p AV nodal ablation.  - Device interrogation as above. 6. OSA: Unable to tolerate CPAP. Wears oxygen  at night. - No change.   Follow  up in 3 months with Dr. Rolan  Signed, Harlene Gainer, FNP-BC 10/21/2024  [facility] [address] [facility] [place] [address] [phone] (office) [phone] (fax) "

## 2024-10-21 NOTE — Progress Notes (Signed)
 EPIC Encounter for ICM Monitoring  Patient Name: [name] is a 69 y.o. male Date: 10/21/2024 Primary Care Physican: Norleen Lynwood ORN, MD Primary Cardiologist: Rolan Electrophysiologist: Almetta Pore Pacing: 99.7%    11/04/2023 Office Weight: 285 lbs 05/26/2024 Home Weight: 240 lbs (approximately 30 lb loss over the last few months) 06/29/2024 Office Weight: 250 lbs 09/07/2024 Weight: 244 lbs   Battery Longevity: Battery Replacement scheduled for 10/26/2024.     Transmission results reviewed.  Pt seen in HF clinic today, 10/21/2024.   Diet: 09/07/2024 Not strict in limiting salt intake.    Since 09/07/2024 ICM Remote Transmission: Optivol thoracic impedance suggesting possible fluid level fluctuation of possible fluid accumulation and currently trending back toward baseline.   Prescribed:  Torsemide  20 mg 4 tablet(s) (80 mg total) by mouth twice a day.  Potassium 20 mEq take 3 tablets (60 mEq total) by mouth daily   Labs: 05/28/2024 Creatinine 1.07, BUN 26, Potassium 4.3, Sodium 139, GFR >60 05/04/2024 Creatinine 0.92, BUN 12, Potassium 3.3, Sodium 138, GFR >60  04/17/2024 Creatinine 1.24, BUN 16, Potassium 3.2, Sodium 134, GFR >60  04/16/2024 Creatinine 0.93, BUN 11, Potassium 3.2, Sodium 136, GFR >60  04/15/2024 Creatinine 1.07, BUN 13, Potassium 3.5, Sodium 136, GFR >60 04/14/2024 Creatinine 1.03, BUN 15, Potassium 3.7, Sodium 137, GFR >60  04/13/2024 Creatinine 1.05, BUN 17, Potassium 4.5, Sodium 133, GFR >60  04/12/2024 Creatinine 1.27, BUN 23, Potassium 4.1, Sodium 134, GFR >60  A complete set of results can be found in Results Review.   Recommendations:  Recommendation given at 10/21/2024 HF clinic office visit.    Follow-up plan: ICM clinic phone appointment on 12/01/2023.  91 day device clinic remote transmission pending battery replacement.   EP/Cardiology Office Visits:    Recall 12/07/2024 with Dr Fernande.     Recall 01/19/2025 with Dr Rolan.    Copy of ICM check sent to  Dr. Almetta.   Remote monitoring is medically necessary for Heart Failure Management.    Daily Thoracic Impedance ICM trend: 07/22/2024 through 10/21/2024.    12-14 Month Thoracic Impedance ICM trend:     Mitzie GORMAN Garner, RN 10/21/2024 4:13 PM

## 2024-10-23 MED ORDER — ACETAMINOPHEN 325 MG PO TABS
ORAL_TABLET | ORAL | Status: AC
  Filled 2024-10-23: qty 2

## 2024-10-23 NOTE — Pre-Procedure Instructions (Signed)
 Instructed patient on the following items: Arrival time 0830- new arrival time Nothing to eat or drink after midnight No meds AM of procedure Responsible person to drive you home and stay with you for 24 hrs Wash with special soap night before and morning of procedure If on anti-coagulant drug instructions Eliquis - last dose 1/8

## 2024-10-26 ENCOUNTER — Ambulatory Visit (HOSPITAL_COMMUNITY)
Admission: RE | Disposition: A | Discharge status: Home / Self Care | Payer: Self-pay | Source: Home / Self Care | Attending: Student in an Organized Health Care Education/Training Program

## 2024-10-26 ENCOUNTER — Ambulatory Visit (HOSPITAL_COMMUNITY)
Admission: RE | Admit: 2024-10-26 | Discharge: 2024-10-26 | Disposition: A | Discharge status: Home / Self Care | Attending: Student in an Organized Health Care Education/Training Program | Admitting: Student in an Organized Health Care Education/Training Program

## 2024-10-26 ENCOUNTER — Other Ambulatory Visit: Payer: Self-pay

## 2024-10-26 DIAGNOSIS — I442 Atrioventricular block, complete: Secondary | ICD-10-CM | POA: Diagnosis not present

## 2024-10-26 DIAGNOSIS — Z4502 Encounter for adjustment and management of automatic implantable cardiac defibrillator: Secondary | ICD-10-CM | POA: Diagnosis not present

## 2024-10-26 DIAGNOSIS — Z87891 Personal history of nicotine dependence: Secondary | ICD-10-CM | POA: Diagnosis not present

## 2024-10-26 DIAGNOSIS — I4821 Permanent atrial fibrillation: Secondary | ICD-10-CM | POA: Diagnosis not present

## 2024-10-26 DIAGNOSIS — I5022 Chronic systolic (congestive) heart failure: Secondary | ICD-10-CM | POA: Insufficient documentation

## 2024-10-26 DIAGNOSIS — Z95810 Presence of automatic (implantable) cardiac defibrillator: Secondary | ICD-10-CM

## 2024-10-26 DIAGNOSIS — I110 Hypertensive heart disease with heart failure: Secondary | ICD-10-CM | POA: Insufficient documentation

## 2024-10-26 DIAGNOSIS — I429 Cardiomyopathy, unspecified: Secondary | ICD-10-CM

## 2024-10-26 DIAGNOSIS — Z59868 Other specified financial insecurity: Secondary | ICD-10-CM | POA: Insufficient documentation

## 2024-10-26 DIAGNOSIS — Z006 Encounter for examination for normal comparison and control in clinical research program: Secondary | ICD-10-CM | POA: Insufficient documentation

## 2024-10-26 HISTORY — PX: BIV ICD GENERATOR CHANGEOUT: EP1194

## 2024-10-26 LAB — CBC
HCT: 31.9 % — ABNORMAL LOW (ref 39.0–52.0)
Hemoglobin: 9.5 g/dL — ABNORMAL LOW (ref 13.0–17.0)
MCH: 26.8 pg (ref 26.0–34.0)
MCHC: 29.8 g/dL — ABNORMAL LOW (ref 30.0–36.0)
MCV: 89.9 fL (ref 80.0–100.0)
Platelets: 294 K/uL (ref 150–400)
RBC: 3.55 MIL/uL — ABNORMAL LOW (ref 4.22–5.81)
RDW: 15.8 % — ABNORMAL HIGH (ref 11.5–15.5)
WBC: 11.1 K/uL — ABNORMAL HIGH (ref 4.0–10.5)
nRBC: 0 % (ref 0.0–0.2)

## 2024-10-26 LAB — GLUCOSE, CAPILLARY
Glucose-Capillary: 81 mg/dL (ref 70–99)
Glucose-Capillary: 77 mg/dL (ref 70–99)

## 2024-10-26 LAB — BASIC METABOLIC PANEL WITH GFR
Anion gap: 10 (ref 5–15)
BUN: 30 mg/dL — ABNORMAL HIGH (ref 8–23)
CO2: 32 mmol/L (ref 22–32)
Calcium: 9.1 mg/dL (ref 8.9–10.3)
Chloride: 102 mmol/L (ref 98–111)
Creatinine, Ser: 1.07 mg/dL (ref 0.61–1.24)
GFR, Estimated: >60 mL/min
Glucose, Bld: 73 mg/dL (ref 70–99)
Potassium: 3.9 mmol/L (ref 3.5–5.1)
Sodium: 144 mmol/L (ref 135–145)

## 2024-10-26 MED ORDER — FENTANYL CITRATE (PF) 100 MCG/2ML IJ SOLN
INTRAMUSCULAR | Status: DC | PRN
  Administered 2024-10-26 (×2): 25 ug via INTRAVENOUS

## 2024-10-26 MED ORDER — LIDOCAINE HCL (PF) 1 % IJ SOLN
INTRAMUSCULAR | Status: AC
  Filled 2024-10-26: qty 60

## 2024-10-26 MED ORDER — LIDOCAINE HCL (PF) 1 % IJ SOLN
INTRAMUSCULAR | Status: DC | PRN
  Administered 2024-10-26: 60 mL

## 2024-10-26 MED ORDER — CEFAZOLIN SODIUM-DEXTROSE 2-4 GM/100ML-% IV SOLN
2.0000 g | INTRAVENOUS | Status: AC
  Administered 2024-10-26: 2 g via INTRAVENOUS

## 2024-10-26 MED ORDER — CEFAZOLIN SODIUM-DEXTROSE 2-4 GM/100ML-% IV SOLN
INTRAVENOUS | Status: AC
  Filled 2024-10-26: qty 100

## 2024-10-26 MED ORDER — FENTANYL CITRATE (PF) 100 MCG/2ML IJ SOLN
INTRAMUSCULAR | Status: AC
  Filled 2024-10-26: qty 2

## 2024-10-26 MED ORDER — CHLORHEXIDINE GLUCONATE 4 % EX SOLN
4.0000 | Freq: Once | CUTANEOUS | Status: DC
  Filled 2024-10-26: qty 60

## 2024-10-26 MED ORDER — SODIUM CHLORIDE 0.9 % IV SOLN
INTRAVENOUS | Status: AC
  Filled 2024-10-26: qty 2

## 2024-10-26 MED ORDER — MIDAZOLAM HCL 2 MG/2ML IJ SOLN
INTRAMUSCULAR | Status: AC
  Filled 2024-10-26: qty 2

## 2024-10-26 MED ORDER — OXYCODONE HCL 5 MG PO TABS: 5.0000 mg | ORAL_TABLET | Freq: Four times a day (QID) | ORAL | 0 refills | Status: AC | PRN

## 2024-10-26 MED ORDER — MIDAZOLAM HCL 5 MG/5ML IJ SOLN
INTRAMUSCULAR | Status: DC | PRN
  Administered 2024-10-26 (×2): 1 mg via INTRAVENOUS

## 2024-10-26 MED ORDER — SODIUM CHLORIDE 0.9 % IV SOLN: INTRAVENOUS | Status: DC

## 2024-10-26 MED ORDER — SODIUM CHLORIDE 0.9 % IV SOLN
80.0000 mg | INTRAVENOUS | Status: AC
  Administered 2024-10-26: 80 mg

## 2024-10-26 NOTE — H&P (Signed)
 "  Cardiology Admission History and Physical:   Patient ID: [name] MRN: [id number]; DOB: Aug 19, 1956   Admission date: 10/26/2024  Primary Care Provider: Norleen Lynwood ORN, MD [facility] Cardiologist: None  [facility] Electrophysiologist:  Elspeth Sage, MD (Inactive)   Chief Complaint: CRTD at ERI  Patient Profile:   [name] is a 69 y.o. male with permanent AF s/p AVNA with LEFT MDT CRTD, HFrEF (LVEF 30-35%), COPD and OSA who presents for generator replacement.   History of Present Illness:   Presents for CRTD generator replacement.   Past Medical History:  Diagnosis Date   AICD (automatic cardioverter/defibrillator) present    Anemia    supposed to be taking Vit B but doesn't   ANXIETY    takes Xanax  nightly   Arthritis    Asthma    Albuterol  prn and Advair  daily;also takes Prednisone  daily   Cardiomyopathy [facility])    a. EF 25% TEE July 2013; b. EF normalized 2015;  c. 03/2015 Echo: EF 40-45%, difrf HK, PASP 38 mmHg, Mild MR, sev LAE/RAE.   Chronic constipation    takes OTC stool softener   COPD (chronic obstructive pulmonary disease) (HCC)    one dr says COPD; one dr says emphysema (09/18/2017)   COVID-19 12/09/2019   DEPRESSION    takes Zoloft  and Doxepin  daily   Diverticulitis    DYSKINESIA, ESOPHAGUS    Essential hypertension        FIBROMYALGIA    GERD (gastroesophageal reflux disease)        Glaucoma    HYPERLIPIDEMIA    a. Intolerant to statins.   INSOMNIA    takes Ambien  nightly   Myocardial infarction [facility])    a. 2012 Myoview  notable for prior infarct;  b. 03/2015 Lexiscan  CL: EF 37%, diff HK, small area of inferior infarct from apex to base-->Med Rx.   O2 dependent    2.5L q hs & prn (09/18/2017)   Paroxysmal atrial fibrillation (HCC)    a. CHA2DS2VASc = 3--> takes Coumadin ;  b. 03/15/2015 Successful TEE/DCCV;  c. 03/2015 recurrent afib, Amio d/c'd in setting of hyperthyroidism.   Peripheral neuropathy    Pneumonia 12/2016   Presence  of permanent cardiac pacemaker    Rash and other nonspecific skin eruption 04/12/2009   no cause found saw dermatologists x 2 and allergist   SLEEP APNEA, OBSTRUCTIVE    a. doesn't use CPAP   Syncope    a. 03/2015 s/p MDT LINQ.   Type II diabetes mellitus (HCC)        Past Surgical History:  Procedure Laterality Date   ACNE CYST REMOVAL     2 on back    AMPUTATION TOE Right 06/01/2024   Procedure: AMPUTATION, TOE;  Surgeon: Janit Thresa HERO, DPM;  Location: [facility] OR;  Service: Orthopedics/Podiatry;  Laterality: Right;  RIGHT 2ND TOE IV SEDATION   AV NODE ABLATION N/A 10/25/2017   Procedure: AV NODE ABLATION;  Surgeon: Sage Elspeth BROCKS, MD;  Location: [facility] INVASIVE CV LAB;  Service: Cardiovascular;  Laterality: N/A;   BIV ICD INSERTION CRT-D N/A 09/18/2017   Procedure: BIV ICD INSERTION CRT-D;  Surgeon: Sage Elspeth BROCKS, MD;  Location: [facility] INVASIVE CV LAB;  Service: Cardiovascular;  Laterality: N/A;   CARDIAC CATHETERIZATION N/A 03/21/2016   Procedure: Right/Left Heart Cath and Coronary Angiography;  Surgeon: Ezra GORMAN Shuck, MD;  Location: [facility] INVASIVE CV LAB;  Service: Cardiovascular;  Laterality: N/A;   CARDIOVERSION  04/18/2012   Procedure: CARDIOVERSION;  Surgeon: Vina LULLA Gull, MD;  Location: [facility] OR;  Service: Cardiovascular;  Laterality: N/A;   CARDIOVERSION  04/25/2012   Procedure: CARDIOVERSION;  Surgeon: Aleene JINNY Passe, MD;  Location: [facility] ENDOSCOPY;  Service: Cardiovascular;  Laterality: N/A;   CARDIOVERSION  04/25/2012   Procedure: CARDIOVERSION;  Surgeon: Vina LULLA Gull, MD;  Location: [facility] OR;  Service: Cardiovascular;  Laterality: N/A;   CARDIOVERSION  05/09/2012   Procedure: CARDIOVERSION;  Surgeon: Ozell Fell, MD;  Location: [facility] OR;  Service: Cardiovascular;  Laterality: N/A;  changed from crenshaw to cooper by trish/leone-endo   CARDIOVERSION N/A 03/15/2015   Procedure: CARDIOVERSION;  Surgeon: Aleene JINNY Passe, MD;  Location: [facility] ENDOSCOPY;  Service: Cardiovascular;  Laterality: N/A;    COLONOSCOPY     COLONOSCOPY WITH PROPOFOL  N/A 10/21/2014   Procedure: COLONOSCOPY WITH PROPOFOL ;  Surgeon: Gwendlyn ONEIDA Buddy, MD;  Location: [facility] ENDOSCOPY;  Service: Endoscopy;  Laterality: N/A;   EP IMPLANTABLE DEVICE N/A 04/06/2015   Procedure: Loop Recorder Insertion;  Surgeon: Danelle LELON Birmingham, MD;  Location: [facility] INVASIVE CV LAB;  Service: Cardiovascular;  Laterality: N/A;   ESOPHAGOGASTRODUODENOSCOPY     JOINT REPLACEMENT     LOOP RECORDER REMOVAL N/A 09/18/2017   Procedure: LOOP RECORDER REMOVAL;  Surgeon: Fernande Elspeth BROCKS, MD;  Location: [facility] INVASIVE CV LAB;  Service: Cardiovascular;  Laterality: N/A;   RIGHT/LEFT HEART CATH AND CORONARY ANGIOGRAPHY N/A 01/28/2017   Procedure: Right/Left Heart Cath and Coronary Angiography;  Surgeon: Ezra GORMAN Shuck, MD;  Location: [facility] INVASIVE CV LAB;  Service: Cardiovascular;  Laterality: N/A;   TEE WITHOUT CARDIOVERSION  04/25/2012   Procedure: TRANSESOPHAGEAL ECHOCARDIOGRAM (TEE);  Surgeon: Aleene JINNY Passe, MD;  Location: [facility] ENDOSCOPY;  Service: Cardiovascular;  Laterality: N/A;   TEE WITHOUT CARDIOVERSION N/A 03/15/2015   Procedure: TRANSESOPHAGEAL ECHOCARDIOGRAM (TEE);  Surgeon: Aleene JINNY Passe, MD;  Location: [facility] ENDOSCOPY;  Service: Cardiovascular;  Laterality: N/A;   TONSILLECTOMY AND ADENOIDECTOMY     TOTAL KNEE ARTHROPLASTY Right 06/15/2014   Procedure: TOTAL KNEE ARTHROPLASTY;  Surgeon: Evalene JONETTA Chancy, MD;  Location: [facility] OR;  Service: Orthopedics;  Laterality: Right;   TOTAL KNEE ARTHROPLASTY Left 10/17/2021   Procedure: Left TOTAL KNEE ARTHROPLASTY;  Surgeon: Vernetta Lonni GRADE, MD;  Location: [facility] OR;  Service: Orthopedics;  Laterality: Left;    Medications Prior to Admission: Prior to Admission medications  Medication Sig Start Date End Date Taking? Authorizing Provider  acetaminophen  (TYLENOL ) 325 MG tablet Take 2 tablets (650 mg total) by mouth every 6 (six) hours as needed for fever or mild pain (pain score 1-3). 04/17/24  Yes Amin, Ankit C, MD   albuterol  (VENTOLIN  HFA) 108 (90 Base) MCG/ACT inhaler INHALE 1-2 PUFFS BY MOUTH EVERY 6 HOURS AS NEEDED FOR WHEEZE OR SHORTNESS OF BREATH 08/07/24  Yes Kassie Acquanetta Bradley, MD  ALPRAZolam  (XANAX ) 0.5 MG tablet TAKE 2 TABLETS (1 MG TOTAL) BY MOUTH EVERY DAY AT BEDTIME 08/24/24  Yes Norleen Lynwood LELON, MD  apixaban  (ELIQUIS ) 5 MG TABS tablet Take 1 tablet (5 mg total) by mouth 2 (two) times daily. 08/20/24  Yes Shuck Ezra GORMAN, MD  ascorbic acid (VITAMIN C) 500 MG tablet Take 500 mg by mouth daily.   Yes [provider]  augmented betamethasone  dipropionate (DIPROLENE -AF) 0.05 % cream Apply 1 application  topically daily as needed (to affected areas- for itching). 10/11/20  Yes [provider]  carvedilol  (COREG ) 3.125 MG tablet Take 1 tablet (3.125 mg total) by mouth 2 (two) times daily. PLEASE SCHEDULE APPOINTMENT FOR  MORE REFILLS [phone] OPTION 2 10/12/24  Yes Bensimhon, Toribio SAUNDERS, MD  dicyclomine  (BENTYL ) 10 MG capsule Take 1 capsule (10 mg total) by mouth 4 (four) times daily -  before meals and at bedtime. 10/06/24  Yes Norleen Lynwood ORN, MD  doxepin  (SINEQUAN ) 10 MG capsule TAKE 2 CAPSULES BY MOUTH AT BEDTIME. 10/12/24  Yes Norleen Lynwood ORN, MD  DULoxetine  (CYMBALTA ) 60 MG capsule Take 1 capsule (60 mg total) by mouth daily. 07/27/24  Yes Norleen Lynwood ORN, MD  Dupilumab (DUPIXENT) 300 MG/2ML SOPN Inject 300 mg into the skin every 14 (fourteen) days.   Yes [provider]  eplerenone  (INSPRA ) 25 MG tablet Take 25 mg by mouth daily.   Yes [provider]  esomeprazole  (NEXIUM ) 40 MG capsule Take 1 capsule (40 mg total) by mouth daily at 12 noon. 04/23/24  Yes May, Deanna J, NP  Evolocumab  (REPATHA  SURECLICK) 140 MG/ML SOAJ INJECT 140 MG INTO THE SKIN EVERY 14 DAYS 05/11/24  Yes Rolan Ezra RAMAN, MD  fluticasone  (FLONASE ) 50 MCG/ACT nasal spray Place 2 sprays into both nostrils daily as needed for allergies or rhinitis. 07/17/23  Yes Norleen Lynwood ORN, MD  gabapentin  (NEURONTIN )  100 MG capsule TAKE 1 CAPSULE (100 MG TOTAL) BY MOUTH THREE TIMES DAILY. 08/17/24  Yes Semon, Jake L, DPM  ibuprofen  (ADVIL ) 200 MG tablet Take 400-800 mg by mouth every 8 (eight) hours as needed for moderate pain (pain score 4-6).   Yes [provider]  lidocaine  (LIDODERM ) 5 % PLACE 1 PATCH ONTO THE SKIN DAILY. REMOVE & DISCARD PATCH WITHIN 12 HOURS OR AS DIRECTED BY MD 08/11/24  Yes Norleen Lynwood ORN, MD  linaclotide  (LINZESS ) 72 MCG capsule Take 1 capsule (72 mcg total) by mouth daily before breakfast. 07/15/24  Yes Norleen Lynwood ORN, MD  losartan  (COZAAR ) 25 MG tablet Take 25 mg by mouth daily.   Yes [provider]  magnesium  hydroxide (MILK OF MAGNESIA) 400 MG/5ML suspension Take 5 mLs by mouth daily as needed for mild constipation. 04/23/24  Yes May, Deanna J, NP  omeprazole  (PRILOSEC) 20 MG capsule TAKE 1 CAPSULE BY MOUTH TWICE A DAY 06/01/24  Yes Norleen Lynwood ORN, MD  ondansetron  (ZOFRAN -ODT) 4 MG disintegrating tablet Take 1 tablet (4 mg total) by mouth every 8 (eight) hours as needed. 10/06/24  Yes Norleen Lynwood ORN, MD  predniSONE  (DELTASONE ) 10 MG tablet Take 1 tablet (10 mg total) by mouth daily with breakfast. 09/24/24  Yes Claudene Arthea HERO, DO  tamsulosin  (FLOMAX ) 0.4 MG CAPS capsule Take 1 capsule (0.4 mg total) by mouth daily. 07/30/24  Yes Norleen Lynwood ORN, MD  Tiotropium Bromide  Monohydrate (SPIRIVA  RESPIMAT) 2.5 MCG/ACT AERS Take 2 puffs by nebulization daily. 01/28/24  Yes Ruthell Lauraine FALCON, NP  tirzepatide  (MOUNJARO ) 10 MG/0.5ML Pen Inject 10 mg into the skin once a week. 09/21/24  Yes Norleen Lynwood ORN, MD  tiZANidine  (ZANAFLEX ) 4 MG tablet TAKE 1 TABLET BY MOUTH 3 TIMES DAILY. 06/05/24  Yes Babs Arthea DASEN, MD  torsemide  (DEMADEX ) 20 MG tablet Take 4 tablets (80 mg total) by mouth 2 (two) times daily. Patient taking differently: Take 80 mg by mouth daily. 05/15/24  Yes Rolan Ezra RAMAN, MD  traZODone  (DESYREL ) 100 MG tablet TAKE 2 TABLETS BY MOUTH AT BEDTIME AS NEEDED 05/13/24  Yes Norleen Lynwood ORN, MD  TUMS 500 MG chewable tablet Chew 1 tablet by mouth 2 (two) times daily as needed for indigestion or heartburn. 10/19/16  Yes [provider]  valACYclovir  (VALTREX ) 1000 MG tablet Take 1,000 mg by mouth 3 (three) times daily as needed (Outbreaks). 05/22/24  Yes [provider]  venlafaxine  XR (EFFEXOR -XR) 150 MG 24 hr capsule TAKE 1 CAPSULE BY MOUTH DAILY WITH BREAKFAST. 06/29/24  Yes Norleen Lynwood ORN, MD  NAPOLEON INHUB 250-50 MCG/ACT AEPB INHALE 1 PUFF INTO THE LUNGS IN THE MORNING AND AT BEDTIME. 03/17/24  Yes Kassie Acquanetta Bradley, MD  zolpidem  (AMBIEN ) 5 MG tablet Take 1 tablet (5 mg total) by mouth at bedtime as needed for sleep. 09/21/24  Yes Norleen Lynwood ORN, MD  Cholecalciferol  (VITAMIN D3) 50 MCG (2000 UT) TABS Take 2,000 Units by mouth daily.    [provider]  Cyanocobalamin  (VITAMIN B-12) 2500 MCG SUBL Place 2,500 mcg under the tongue 2 (two) times a week. Patient not taking: No sig reported    [provider]  hydrOXYzine  (VISTARIL ) 100 MG capsule Take 1 capsule (100 mg total) by mouth 3 (three) times daily as needed for itching. Patient not taking: Reported on 10/20/2024 09/15/24   Norleen Lynwood ORN, MD  ipratropium-albuterol  (DUONEB) 0.5-2.5 (3) MG/3ML SOLN TAKE 3 ML BY NEBULIZATION EVERY 4 HOURS AS NEEDED (WHEEZING). 01/29/23   Norleen Lynwood ORN, MD  meclizine  (ANTIVERT ) 25 MG tablet Take 1 tablet (25 mg total) by mouth 3 (three) times daily as needed for dizziness. 05/05/24   Theadore Ozell HERO, MD  Multiple Vitamins-Minerals (SENIOR VITES PO) Take 1 tablet by mouth daily with breakfast.    [provider]  oxyCODONE -acetaminophen  (PERCOCET) 5-325 MG tablet Take 1 tablet by mouth every 6 (six) hours as needed for severe pain (pain score 7-10). Patient not taking: No sig reported 06/10/24   Janit Thresa HERO, DPM    Allergies:   Allergies[1]  Social History:   Social History   Socioeconomic History   Marital status: Divorced    Spouse name: Not on file    Number of children: 2   Years of education: Not on file   Highest education level: GED or equivalent  Occupational History   Occupation: retired/disabled. prev worked in teacher, english as a foreign language.    Employer: DISABLED  Tobacco Use   Smoking status: Former    Current packs/day: 0.00    Average packs/day: 2.0 packs/day for 30.0 years (60.0 ttl pk-yrs)    Types: Cigarettes    Start date: 10/15/1977    Quit date: 10/16/2007    Years since quitting: 17.0   Smokeless tobacco: Never  Vaping Use   Vaping status: Never Used  Substance and Sexual Activity   Alcohol use: No   Drug use: No   Sexual activity: Not Currently  Other Topics Concern   Not on file  Social History Narrative   Lives with girlfriend   Social Drivers of Health   Tobacco Use: Medium Risk (10/21/2024)   Patient History    Smoking Tobacco Use: Former    Smokeless Tobacco Use: Never    Passive Exposure: Not on file  Financial Resource Strain: Medium Risk (01/01/2024)   Overall Financial Resource Strain (CARDIA)    Difficulty of Paying Living Expenses: Somewhat hard  Food Insecurity: No Food Insecurity (09/17/2024)   Epic    Worried About Programme Researcher, Broadcasting/film/video in the Last Year: Never true    Ran Out of Food in the Last Year: Never true  Transportation Needs: No Transportation Needs (04/13/2024)   Epic    Lack of Transportation (Medical): No    Lack of Transportation (Non-Medical): No  Physical Activity:  Inactive (09/17/2024)   Exercise Vital Sign    Days of Exercise per Week: 0 days    Minutes of Exercise per Session: 0 min  Stress: No Stress Concern Present (09/17/2024)   [employer] of Occupational Health - Occupational Stress Questionnaire    Feeling of Stress: Only a little  Social Connections: Socially Isolated (09/17/2024)   Social Connection and Isolation Panel    Frequency of Communication with Friends and Family: Once a week    Frequency of Social Gatherings with Friends and Family: Never    Attends Religious  Services: Never    Database Administrator or Organizations: No    Attends Banker Meetings: Never    Marital Status: Divorced  Catering Manager Violence: Not At Risk (04/13/2024)   Epic    Fear of Current or Ex-Partner: No    Emotionally Abused: No    Physically Abused: No    Sexually Abused: No  Depression (PHQ2-9): Low Risk (09/17/2024)   Depression (PHQ2-9)    PHQ-2 Score: 0  Alcohol Screen: Low Risk (09/16/2023)   Alcohol Screen    Last Alcohol Screening Score (AUDIT): 0  Housing: Low Risk (09/17/2024)   Epic    Unable to Pay for Housing in the Last Year: No    Number of Times Moved in the Last Year: 1    Homeless in the Last Year: No  Utilities: Not At Risk (04/13/2024)   Epic    Threatened with loss of utilities: No  Health Literacy: Adequate Health Literacy (09/17/2024)   B1300 Health Literacy    Frequency of need for help with medical instructions: Never    Family History:   The patient's family history includes Allergies in his mother; Asthma in his maternal grandmother and mother; COPD in his mother; Colon polyps in his mother; Hypothyroidism in his mother. There is no history of Colon cancer.    ROS:   Review of Systems: [y] = yes, [ ]  = no      General: Weight gain [ ] ; Weight loss [ ] ; Anorexia [ ] ; Fatigue [ ] ; Fever [ ] ; Chills [ ] ; Weakness [ ]    Cardiac: Chest pain/pressure [ ] ; Resting SOB [ ] ; Exertional SOB [ ] ; Orthopnea [ ] ; Pedal Edema [ ] ; Palpitations [ ] ; Syncope [ ] ; Presyncope [ ] ; Paroxysmal nocturnal dyspnea [ ]    Pulmonary: Cough [ ] ; Wheezing [ ] ; Hemoptysis [ ] ; Sputum [ ] ; Snoring [ ]    GI: Vomiting [ ] ; Dysphagia [ ] ; Melena [ ] ; Hematochezia [ ] ; Heartburn [ ] ; Abdominal pain [ ] ; Constipation [ ] ; Diarrhea [ ] ; BRBPR [ ]    GU: Hematuria [ ] ; Dysuria [ ] ; Nocturia [ ]  Vascular: Pain in legs with walking [ ] ; Pain in feet with lying flat [ ] ; Non-healing sores [ ] ; Stroke [ ] ; TIA [ ] ; Slurred speech [ ] ;   Neuro: Headaches [ ] ;  Vertigo [ ] ; Seizures [ ] ; Paresthesias [ ] ;Blurred vision [ ] ; Diplopia [ ] ; Vision changes [ ]    Ortho/Skin: Arthritis [ ] ; Joint pain [ ] ; Muscle pain [ ] ; Joint swelling [ ] ; Back Pain [ ] ; Rash [ ]    Psych: Depression [ ] ; Anxiety [ ]    Heme: Bleeding problems [ ] ; Clotting disorders [ ] ; Anemia [ ]    Endocrine: Diabetes [ ] ; Thyroid  dysfunction [ ]    Physical Exam/Data:   Vitals:   10/26/24 0848  BP: 103/62  Pulse: 71  Resp:  16  Temp: 98.2 F (36.8 C)  TempSrc: Oral  SpO2: 97%  Weight: 106.6 kg  Height: 5' 10 (1.778 m)   No intake or output data in the 24 hours ending 10/26/24 1251    10/26/2024    8:48 AM 10/21/2024    3:13 PM 09/17/2024    4:15 PM  Last 3 Weights  Weight (lbs) 235 lb 232 lb 240 lb  Weight (kg) 106.595 kg 105.235 kg 108.863 kg     Body mass index is 33.72 kg/m.  General:  Well nourished, well developed, in no acute distress HEENT: normal Cardiac:  normal S1, S2; RRR; no murmur  Lungs:  clear to auscultation bilaterally, no wheezing, rhonchi or rales  Abd: soft, nontender, no hepatomegaly  Skin: warm and dry, LEFT infaclavicular incision well healed, no erosion or erythema  Laboratory Data:  Chemistry Recent Labs  Lab 10/21/24 1539 10/26/24 0857  NA 139 144  K 4.3 3.9  CL 99 102  CO2 32 32  GLUCOSE 91 73  BUN 36* 30*  CREATININE 1.08 1.07  CALCIUM  9.3 9.1  GFRNONAA >60 >60  ANIONGAP 8 10   Hematology Recent Labs  Lab 10/26/24 0857  WBC 11.1*  RBC 3.55*  HGB 9.5*  HCT 31.9*  MCV 89.9  MCH 26.8  MCHC 29.8*  RDW 15.8*  PLT 294   BNP Recent Labs  Lab 10/21/24 1539  PROBNP 222.0    Assessment and Plan:  [name] is a 69 y.o. male with permanent AF s/p AVNA with LEFT MDT CRTD, HFrEF (LVEF 30-35%), COPD and OSA who presents for generator replacement.   Permanent AF s/p AVNA LEFT MDT CRTD (plugged RA port) HFrEF Reviewed risks, benefits and alternatives of generator replacement. Mainly related to infection, bleeding  and hematoma. He is agreeable to proceed. Eliquis  held since last Thursday. Plan to resume this Thursday morning. Same day discharge.   questions or updates, please contact [facility] Please consult [email or web address] for contact info under   Signed, [name], MD  10/26/2024 12:51 PM      [1]  Allergies Allergen Reactions   Amiodarone  Other (See Comments)    Caused hyperthyroidism    Statins Other (See Comments)    Myalgias    Sulfa  Antibiotics Other (See Comments)    Mouth ulcers   Wound Dressing Adhesive Other (See Comments)    Skin Tears Use Paper Tape Only   "

## 2024-10-26 NOTE — Interval H&P Note (Signed)
 History and Physical Interval Note:  10/26/2024 12:55 PM  [name]  has presented today for surgery, with the diagnosis of eri.  The various methods of treatment have been discussed with the patient and family. After consideration of risks, benefits and other options for treatment, the patient has consented to  Procedures: BIV ICD GENERATOR CHANGEOUT (N/A) as a surgical intervention.  The patient's history has been reviewed, patient examined, no change in status, stable for surgery.  I have reviewed the patient's chart and labs.  Questions were answered to the patient's satisfaction.     [name]

## 2024-10-26 NOTE — Discharge Instructions (Addendum)
 Implantable Cardiac Device Battery Change, Care After  This sheet gives you information about how to care for yourself after your procedure. Your health care provider may also give you more specific instructions. If you have problems or questions, contact your health care provider. What can I expect after the procedure? After your procedure, it is common to have: Pain or soreness at the site where the cardiac device was inserted. Swelling at the site where the cardiac device was inserted. You should received an information card for your new device in 4-8 weeks. Follow these instructions at home: Incision care  Keep the incision clean and dry. Do not take baths, swim, or use a hot tub until after your wound check.  Pat the area dry with a clean towel. Do not rub the area. This may cause bleeding. Follow instructions from your health care provider about how to take care of your incision. Make sure you: If your incision is closed with Dermabond/Surgical glue. You may shower 1 day after your pacemaker implant and wash around the site with soap and water.  Check your incision area every day for signs of infection. Check for: More redness, swelling, or pain. More fluid or blood. Warmth. Pus or a bad smell. Activity Do not lift anything that is heavier than 10 lb (4.5 kg) until your health care provider says it is okay to do so. For the first week, or as long as told by your health care provider: Avoid lifting your affected arm higher than your shoulder. After 1 week, Be gentle when you move your arms over your head. It is okay to raise your arm to comb your hair. Avoid strenuous exercise. Ask your health care provider when it is okay to: Resume your normal activities. Return to work or school. Resume sexual activity. Eating and drinking Eat a heart-healthy diet. This should include plenty of fresh fruits and vegetables, whole grains, low-fat dairy products, and lean protein like chicken and  fish. Limit alcohol intake to no more than 1 drink a day for non-pregnant women and 2 drinks a day for men. One drink equals 12 oz of beer, 5 oz of wine, or 1 oz of hard liquor. Check ingredients and nutrition facts on packaged foods and beverages. Avoid the following types of food: Food that is high in salt (sodium). Food that is high in saturated fat, like full-fat dairy or red meat. Food that is high in trans fat, like fried food. Food and drinks that are high in sugar. Lifestyle Do not use any products that contain nicotine or tobacco, such as cigarettes and e-cigarettes. If you need help quitting, ask your health care provider. Take steps to manage and control your weight. Once cleared, get regular exercise. Aim for 150 minutes of moderate-intensity exercise (such as walking or yoga) or 75 minutes of vigorous exercise (such as running or swimming) each week. Manage other health problems, such as diabetes or high blood pressure. Ask your health care provider how you can manage these conditions. General instructions Do not drive for 24 hours after your procedure if you were given a medicine to help you relax (sedative). Take over-the-counter and prescription medicines only as told by your health care provider. Avoid putting pressure on the area where the cardiac device was placed. If you need an MRI after your cardiac device has been placed, be sure to tell the health care provider who orders the MRI that you have a cardiac device. Avoid close and prolonged exposure to  electrical devices that have strong magnetic fields. These include: Cell phones. Avoid keeping them in a pocket near the cardiac device, and try using the ear opposite the cardiac device. MP3 players. Household appliances, like microwaves. Metal detectors. Electric generators. High-tension wires. Keep all follow-up visits as directed by your health care provider. This is important. Contact a health care provider if: You  have pain at the incision site that is not relieved by over-the-counter or prescription medicines. You have any of these around your incision site or coming from it: More redness, swelling, or pain. Fluid or blood. Warmth to the touch. Pus or a bad smell. You have a fever. You feel brief, occasional palpitations, light-headedness, or any symptoms that you think might be related to your heart. Get help right away if: You experience chest pain that is different from the pain at the cardiac device site. You develop a red streak that extends above or below the incision site. You experience shortness of breath. You have palpitations or an irregular heartbeat. You have light-headedness that does not go away quickly. You faint or have dizzy spells. Your pulse suddenly drops or increases rapidly and does not return to normal. You begin to gain weight and your legs and ankles swell. Summary After your procedure, it is common to have pain, soreness, and some swelling where the cardiac device was inserted. Make sure to keep your incision clean and dry. Follow instructions from your health care provider about how to take care of your incision. Check your incision every day for signs of infection, such as more pain or swelling, pus or a bad smell, warmth, or leaking fluid and blood. Avoid strenuous exercise and lifting your left arm higher than your shoulder for 2 weeks, or as long as told by your health care provider. This information is not intended to replace advice given to you by your health care provider. Make sure you discuss any questions you have with your health care provider.

## 2024-10-27 ENCOUNTER — Telehealth (HOSPITAL_COMMUNITY): Payer: Self-pay

## 2024-10-27 ENCOUNTER — Other Ambulatory Visit: Payer: Self-pay | Admitting: Internal Medicine

## 2024-10-27 ENCOUNTER — Telehealth: Payer: Self-pay

## 2024-10-27 ENCOUNTER — Encounter (HOSPITAL_COMMUNITY): Payer: Self-pay | Admitting: Student in an Organized Health Care Education/Training Program

## 2024-10-27 ENCOUNTER — Other Ambulatory Visit (HOSPITAL_COMMUNITY): Payer: Self-pay

## 2024-10-27 MED FILL — Midazolam HCl Inj 2 MG/2ML (Base Equivalent): INTRAMUSCULAR | Qty: 2 | Status: AC

## 2024-10-27 NOTE — Telephone Encounter (Signed)
 Advanced Heart Failure Patient Advocate Encounter  The patient was approved for a [employer] grant that will help cover the cost of Carvedilol , Eliquis , Eplerenone , Losartan .  Total amount awarded, $7,500.  Effective: 09/27/2024 - 09/26/2025.  BIN [id number] PCN [id number] [id number] ID [id number]  Pharmacy provided with approval and processing information. Patient informed via voicemail and [employer].  Rachel DEL, CPhT Rx Patient Advocate Phone: [phone]

## 2024-10-27 NOTE — Op Note (Signed)
" ° °  Procedure:  LEFT sided MDT CRTD generator change  Pre-op  Diagnosis: CHB    Post-op Diagnosis: same  Procedure Date: 10/26/24  Attending: Adina Primus, MD   Anesthesia: monitored anesthesia care  Procedure Report:  The LEFT shoulder was prepped with chlorhexidine  scrub. 40 cc lidocaine  was injected at the planned incision site. The subcutaneous tissue was dissected with electrocautery and blunt dissection. The pocket overlying the device was dissected to allow for removal of the device. The leads were then removed from the implanted device.  The new device was then connected to the existing leads. The pocket was irrigated with gentamicin  solution. A TYRX pouch was used.  The device was affixed to the underlying fascia with a single 0-silk suture. The pocket was closed with continuous 2-0 V-loc and 3-0 Stratafix suture. Dermabond was used to close the superficial layer.    The device was evaluated by the representative of the cardiac device manufacturer under the supervision of the attending physician with implant parameters listed below.   Complications: none Estimated blood loss: less than 50 mL  Implanted Hardware: Implant Name Type Inv. Item Serial No. Manufacturer Lot No. LRB No. Used Action  POUCH AIGIS-R ANTIBACT ICD LRG - [id number] Mesh General POUCH AIGIS-R ANTIBACT ICD LRG  [employer] [id number] N/A 1 Implanted  ICD [place] XT QUAD CRT DTPA2QQ - [id number] ICD Generator ICD [place] XT QUAD CRT DTPA2QQ [id number] [employer]  N/A 1 Implanted  MERM DTMA1QQ CLARIA MRI QUAD CRT-D [id number] H   [id number] H [employer]   1 Explanted   Device Information: CRT-D Generator: MDT Cobalt XT Quad CRT [id number], SN [id number], DOI 10/26/24  Lead Interrogation Data: RA: pin plug RV: CHB / 380 ohms / 1.0 V @ 0.4 ms, SN [id number], DOI 09/18/17 LV: CHB / 494 ohms / 2.0 V @ 1.0 ms, SN [id number], DOI 09/18/17  Summary: 1. Successful  implant of a LEFT sided BSCI CRTD generator change  Recommendations: Routine post-procedure care with bedrest for 1 hour No heparin  (IV or subcutaneous) for 48 hours. No enoxaparin  (IV or subcutaneous) for 7 days.  Resume Eliquis  Thursday AM  Discharge after post-procedure bedrest.  Zachary DELENA Primus, MD [facility]  Cardiac Electrophysiology      "

## 2024-10-27 NOTE — Telephone Encounter (Signed)
 Botox 10/15/2024-10/14/2025 Ref# [id number] [facility]

## 2024-11-03 ENCOUNTER — Encounter: Payer: Self-pay | Admitting: Family Medicine

## 2024-11-03 ENCOUNTER — Encounter

## 2024-11-04 ENCOUNTER — Ambulatory Visit (HOSPITAL_BASED_OUTPATIENT_CLINIC_OR_DEPARTMENT_OTHER): Admitting: Pulmonary Disease

## 2024-11-04 ENCOUNTER — Encounter (HOSPITAL_BASED_OUTPATIENT_CLINIC_OR_DEPARTMENT_OTHER): Payer: Self-pay | Admitting: Pulmonary Disease

## 2024-11-04 VITALS — BP 93/57 | HR 71 | Ht 70.0 in | Wt 237.0 lb

## 2024-11-04 DIAGNOSIS — Z87891 Personal history of nicotine dependence: Secondary | ICD-10-CM | POA: Diagnosis not present

## 2024-11-04 DIAGNOSIS — G4734 Idiopathic sleep related nonobstructive alveolar hypoventilation: Secondary | ICD-10-CM | POA: Insufficient documentation

## 2024-11-04 DIAGNOSIS — J4489 Other specified chronic obstructive pulmonary disease: Secondary | ICD-10-CM

## 2024-11-04 DIAGNOSIS — R0902 Hypoxemia: Secondary | ICD-10-CM | POA: Diagnosis not present

## 2024-11-04 DIAGNOSIS — R0602 Shortness of breath: Secondary | ICD-10-CM

## 2024-11-04 DIAGNOSIS — G4733 Obstructive sleep apnea (adult) (pediatric): Secondary | ICD-10-CM

## 2024-11-04 MED ORDER — SPIRIVA RESPIMAT 2.5 MCG/ACT IN AERS: 2.0000 | INHALATION_SPRAY | Freq: Every day | RESPIRATORY_TRACT | 11 refills | Status: AC

## 2024-11-04 MED ORDER — FLUTICASONE-SALMETEROL 250-50 MCG/ACT IN AEPB: 1.0000 | INHALATION_SPRAY | Freq: Two times a day (BID) | RESPIRATORY_TRACT | 11 refills | Status: AC

## 2024-11-04 NOTE — Patient Instructions (Addendum)
 Nocturnal hypoxemia --Re-ORDER overnight oximetry on room air --Please call us  by the end of January if you have not received/scheduled your test  COPD with asthma (HCC) --CONTINUE Wixela 250-50 mcg ONE puff in the morning and evening.  --CONTINUE Spiriva  2.5 mcg TWO puffs in the morning --CONTINUE Albuterol  AS NEEDED for shortness of breath or wheezing --CONTINUE Dupixent per Dermatology. Improves eczema but helps asthma as well --Consider Ohtuvayre  if symptoms persist. Patient declined for now

## 2024-11-04 NOTE — Assessment & Plan Note (Signed)
--  Re-ORDER overnight oximetry on room air --Please call us  by the end of January if you have not received/scheduled your test

## 2024-11-04 NOTE — Progress Notes (Signed)
 "   Subjective:   PATIENT ID: [name] [name] GENDER: male DOB: Jun 11, 1956, MRN: [id number]  Chief Complaint  Patient presents with   COPD    Did not do ONO that was ordered in 10/25    Reason for Visit: Follow-up  [name] is a 69 year old male former smoker with COPD, childhood asthma, OSA not on CPAP, DM2, HTN, atrial fib on eliquis , NICM s/p AICD, chronic diastolic heart failure who presents for follow-up.  He was recently treated for COPD exacerbation on 09/24/22. Treated with augmentin  and doxycycline . On duonebs and albuterol . Referred to Pulmonary. Cough and wheezing has improved. He reports shortness of breath with walking a few steps. Denies chronic cough. Has baseline wheezing. Albuterol  usually controls his symptoms. Currently on Dupixent which is managed by his dermatologist. Has been on it for two years and feels this helps his breathing. Before the pandemic [facility] [place] [facility] [place] [facility].   12/31/22 Since our last visit he has had worsening shortness of breath associated with exertional wheezing. Uses nebulizer twice a day. Compliant with Trelegy daily. Congestion has worsened with the pollen. Reports improved cough and wheezing overall but still persistent. Denies recent fevers, chills. He was treated for RLE cellulitis last month but also treated for heart failure while inpatient.   02/13/23 Since our last visit he was treated for exacerbation and feeling better after steroids. Continues to have shortness of breath, cough and occasional wheezing. He is not on Trelegy and using Duonebs twice a day. He uses albuterol  twice a day on average. He initially did not want to walk or be evaluated for oxygen  needs due to concern for price and not wanting to wear oxygen . Unable to afford Dupixent and does not qualify for assistance per patient with Allergy .   06/13/23 Since our last visit he reports worsening shortness of breath. Stays at home and minimizes  activity. Limited due to knee issues and LLE wound. When he exerts himself he feels weak, shaky and nauseated. Baseline cough and occasional wheezing unchanged. Complains of right sided neck swelling for >1 week and feels like it is affecting his breathing. Denies fevers or chills. Denies problems swallowing. Does have a preference to lean head to the right side  09/10/23 Since our last visit he has unchanged shortness of breath but has had chronic neck pain for months that causes his neck to be positioned poorly while sleeping. Has cough or wheezing with activity. Compliant with wixela and spiriva . No respiratory infections since our last visit.   01/21/24 Since our last visit he reports his respiratory symptoms have improved since the colder weather as resolved. He is compliant with Wixela, Spiriva  and Albuterol  up to twice a day. Minimal wheezing. Limited activity with cane but able to mobilize in the home. Denies exacerbations since our last visit.  07/29/24 Since our last visit he has been compliant with Wixela and Spiriva . Uses albuterol  2-3 times a week in setting of activity and for wheezing. Wears O2 24/7. Walks with walker at home. At baseline he has shortness of breath and wheezing with activity. Denies cough. Denies respiratory illness but was hospitalized in April and June/July 2025 and has been weak since then. Went to rehab and had home PT but didn't find staff helpful.  11/04/24 Since our last visit he reports unchanged shortness of breath with activity. Denies cough.  Compliant with oxygen . On Wixela and Spiriva . Using albuterol  once a week for wheezing.  No exacerbations since our last visit. Recently had his PM battery changed.   Social History: Quit in 2009. Former smoker 60 pack -years   Past Medical History:  Diagnosis Date   AICD (automatic cardioverter/defibrillator) present    Anemia    supposed to be taking Vit B but doesn't   ANXIETY    takes Xanax  nightly   Arthritis     Asthma    Albuterol  prn and Advair  daily;also takes Prednisone  daily   Cardiomyopathy [facility])    a. EF 25% TEE July 2013; b. EF normalized 2015;  c. 03/2015 Echo: EF 40-45%, difrf HK, PASP 38 mmHg, Mild MR, sev LAE/RAE.   Chronic constipation    takes OTC stool softener   COPD (chronic obstructive pulmonary disease) (HCC)    one dr says COPD; one dr says emphysema (09/18/2017)   COVID-19 12/09/2019   DEPRESSION    takes Zoloft  and Doxepin  daily   Diverticulitis    DYSKINESIA, ESOPHAGUS    Essential hypertension        FIBROMYALGIA    GERD (gastroesophageal reflux disease)        Glaucoma    HYPERLIPIDEMIA    a. Intolerant to statins.   INSOMNIA    takes Ambien  nightly   Myocardial infarction [facility])    a. 2012 Myoview  notable for prior infarct;  b. 03/2015 Lexiscan  CL: EF 37%, diff HK, small area of inferior infarct from apex to base-->Med Rx.   O2 dependent    2.5L q hs & prn (09/18/2017)   Paroxysmal atrial fibrillation (HCC)    a. CHA2DS2VASc = 3--> takes Coumadin ;  b. 03/15/2015 Successful TEE/DCCV;  c. 03/2015 recurrent afib, Amio d/c'd in setting of hyperthyroidism.   Peripheral neuropathy    Pneumonia 12/2016   Presence of permanent cardiac pacemaker    Rash and other nonspecific skin eruption 04/12/2009   no cause found saw dermatologists x 2 and allergist   SLEEP APNEA, OBSTRUCTIVE    a. doesn't use CPAP   Syncope    a. 03/2015 s/p MDT LINQ.   Type II diabetes mellitus (HCC)          Family History  Problem Relation Age of Onset   COPD Mother    Asthma Mother    Colon polyps Mother    Allergies Mother    Hypothyroidism Mother    Asthma Maternal Grandmother    Colon cancer Neg Hx      Social History   Occupational History   Occupation: retired/disabled. prev worked in teacher, english as a foreign language.    Employer: DISABLED  Tobacco Use   Smoking status: Former    Current packs/day: 0.00    Average packs/day: 2.0 packs/day for 30.0 years (60.0 ttl pk-yrs)    Types:  Cigarettes    Start date: 10/15/1977    Quit date: 10/16/2007    Years since quitting: 17.0   Smokeless tobacco: Never  Vaping Use   Vaping status: Never Used  Substance and Sexual Activity   Alcohol use: No   Drug use: No   Sexual activity: Not Currently    Allergies  Allergen Reactions   Amiodarone  Other (See Comments)    Caused hyperthyroidism    Statins Other (See Comments)    Myalgias    Sulfa  Antibiotics Other (See Comments)    Mouth ulcers   Wound Dressing Adhesive Other (See Comments)    Skin Tears Use Paper Tape Only     Outpatient Medications Prior to Visit  Medication Sig Dispense Refill  acetaminophen  (TYLENOL ) 325 MG tablet Take 2 tablets (650 mg total) by mouth every 6 (six) hours as needed for fever or mild pain (pain score 1-3).     albuterol  (VENTOLIN  HFA) 108 (90 Base) MCG/ACT inhaler INHALE 1-2 PUFFS BY MOUTH EVERY 6 HOURS AS NEEDED FOR WHEEZE OR SHORTNESS OF BREATH 8.5 each 5   ALPRAZolam  (XANAX ) 0.5 MG tablet TAKE 2 TABLETS (1 MG TOTAL) BY MOUTH EVERY DAY AT BEDTIME 60 tablet 2   apixaban  (ELIQUIS ) 5 MG TABS tablet Take 1 tablet (5 mg total) by mouth 2 (two) times daily. 60 tablet 11   ascorbic acid (VITAMIN C) 500 MG tablet Take 500 mg by mouth daily.     augmented betamethasone  dipropionate (DIPROLENE -AF) 0.05 % cream Apply 1 application  topically daily as needed (to affected areas- for itching).     carvedilol  (COREG ) 3.125 MG tablet Take 1 tablet (3.125 mg total) by mouth 2 (two) times daily. PLEASE SCHEDULE APPOINTMENT FOR MORE REFILLS [phone] OPTION 2 180 tablet 0   Cholecalciferol  (VITAMIN D3) 50 MCG (2000 UT) TABS Take 2,000 Units by mouth daily.     Cyanocobalamin  (VITAMIN B-12) 2500 MCG SUBL Place 2,500 mcg under the tongue 2 (two) times a week.     dicyclomine  (BENTYL ) 10 MG capsule Take 1 capsule (10 mg total) by mouth 4 (four) times daily -  before meals and at bedtime. 90 capsule 2   doxepin  (SINEQUAN ) 10 MG capsule TAKE 2 CAPSULES BY MOUTH  AT BEDTIME. 180 capsule 1   DULoxetine  (CYMBALTA ) 60 MG capsule Take 1 capsule (60 mg total) by mouth daily. 90 capsule 0   Dupilumab (DUPIXENT) 300 MG/2ML SOPN Inject 300 mg into the skin every 14 (fourteen) days.     eplerenone  (INSPRA ) 25 MG tablet Take 25 mg by mouth daily.     esomeprazole  (NEXIUM ) 40 MG capsule Take 1 capsule (40 mg total) by mouth daily at 12 noon. 30 capsule 2   Evolocumab  (REPATHA  SURECLICK) 140 MG/ML SOAJ INJECT 140 MG INTO THE SKIN EVERY 14 DAYS 6 mL 3   fluticasone  (FLONASE ) 50 MCG/ACT nasal spray Place 2 sprays into both nostrils daily as needed for allergies or rhinitis. 48 mL 1   gabapentin  (NEURONTIN ) 100 MG capsule TAKE 1 CAPSULE (100 MG TOTAL) BY MOUTH THREE TIMES DAILY. 90 capsule 3   hydrOXYzine  (VISTARIL ) 100 MG capsule Take 1 capsule (100 mg total) by mouth 3 (three) times daily as needed for itching. 90 capsule 2   ibuprofen  (ADVIL ) 200 MG tablet Take 400-800 mg by mouth every 8 (eight) hours as needed for moderate pain (pain score 4-6).     ipratropium-albuterol  (DUONEB) 0.5-2.5 (3) MG/3ML SOLN TAKE 3 ML BY NEBULIZATION EVERY 4 HOURS AS NEEDED (WHEEZING). 360 mL 0   lidocaine  (LIDODERM ) 5 % PLACE 1 PATCH ONTO THE SKIN DAILY. REMOVE & DISCARD PATCH WITHIN 12 HOURS OR AS DIRECTED BY MD 30 patch 2   linaclotide  (LINZESS ) 72 MCG capsule Take 1 capsule (72 mcg total) by mouth daily before breakfast. 90 capsule 3   losartan  (COZAAR ) 25 MG tablet Take 25 mg by mouth daily.     magnesium  hydroxide (MILK OF MAGNESIA) 400 MG/5ML suspension Take 5 mLs by mouth daily as needed for mild constipation. 355 mL 0   meclizine  (ANTIVERT ) 25 MG tablet Take 1 tablet (25 mg total) by mouth 3 (three) times daily as needed for dizziness. 30 tablet 0   Multiple Vitamins-Minerals (SENIOR VITES PO) Take 1 tablet by  mouth daily with breakfast.     omeprazole  (PRILOSEC) 20 MG capsule TAKE 1 CAPSULE BY MOUTH TWICE A DAY 180 capsule 1   ondansetron  (ZOFRAN -ODT) 4 MG disintegrating tablet  Take 1 tablet (4 mg total) by mouth every 8 (eight) hours as needed. 30 tablet 1   oxyCODONE -acetaminophen  (PERCOCET) 5-325 MG tablet Take 1 tablet by mouth every 6 (six) hours as needed for severe pain (pain score 7-10). 20 tablet 0   predniSONE  (DELTASONE ) 10 MG tablet Take 1 tablet (10 mg total) by mouth daily with breakfast. 90 tablet 0   tamsulosin  (FLOMAX ) 0.4 MG CAPS capsule Take 1 capsule (0.4 mg total) by mouth daily. 30 capsule 5   Tiotropium Bromide  Monohydrate (SPIRIVA  RESPIMAT) 2.5 MCG/ACT AERS Take 2 puffs by nebulization daily. 4 g 3   tirzepatide  (MOUNJARO ) 10 MG/0.5ML Pen Inject 10 mg into the skin once a week. 6 mL 3   tiZANidine  (ZANAFLEX ) 4 MG tablet TAKE 1 TABLET BY MOUTH 3 TIMES DAILY. 270 tablet 2   torsemide  (DEMADEX ) 20 MG tablet Take 4 tablets (80 mg total) by mouth 2 (two) times daily. (Patient taking differently: Take 80 mg by mouth daily.) 720 tablet 2   traZODone  (DESYREL ) 100 MG tablet TAKE 2 TABLETS BY MOUTH AT BEDTIME AS NEEDED 180 tablet 1   TUMS 500 MG chewable tablet Chew 1 tablet by mouth 2 (two) times daily as needed for indigestion or heartburn.     valACYclovir  (VALTREX ) 1000 MG tablet Take 1,000 mg by mouth 3 (three) times daily as needed (Outbreaks).     venlafaxine  XR (EFFEXOR -XR) 150 MG 24 hr capsule TAKE 1 CAPSULE BY MOUTH DAILY WITH BREAKFAST. 90 capsule 3   WIXELA INHUB 250-50 MCG/ACT AEPB INHALE 1 PUFF INTO THE LUNGS IN THE MORNING AND AT BEDTIME. 60 each 8   zolpidem  (AMBIEN ) 5 MG tablet Take 1 tablet (5 mg total) by mouth at bedtime as needed for sleep. 30 tablet 5   No facility-administered medications prior to visit.    Review of Systems  Constitutional:  Negative for chills, diaphoresis, fever, malaise/fatigue and weight loss.  HENT:  Negative for congestion.   Respiratory:  Positive for shortness of breath. Negative for cough, hemoptysis, sputum production and wheezing.   Cardiovascular:  Negative for chest pain, palpitations and leg  swelling.     Objective:   Vitals:   11/04/24 1613  BP: (!) 93/57  Pulse: 71  SpO2: 96%  Weight: 237 lb (107.5 kg)  Height: 5' 10 (1.778 m)   SpO2: 96 %  Body mass index is 34.01 kg/m.  Physical Exam: General: Chronically ill-appearing, torticollis, no acute distress HENT: [place], AT Eyes: EOMI, no scleral icterus Respiratory: Clear to auscultation bilaterally.  No crackles, wheezing or rales Cardiovascular: RRR, -M/R/G, no JVD Extremities:-Edema,-tenderness Neuro: AAO x4, CNII-XII grossly intact Psych: Normal mood, normal affect  Data Reviewed:  Imaging: CXR 05/05/21 - Cardiomegaly. S/p biventricular ICD. Small bilateral pleural effusion CXR 11/29/22 - Cardiomegaly. S/p biventricular ICD. Resolved effusions CXR 01/25/23 - Cardiomegaly. S/p biventricular ICD. Bibasilar atelectasis  PFT: 01/27/14 FVC 3.35 (72%) FEV1 2.41 (68%) Ratio 65  TLC 94% DLCO 77% Interpretation: Moderate obstructive defect with mildly reduced DLCO. Borderline bronchodilator response in FEV1 present.  Labs: CBC    Component Value Date/Time   WBC 11.1 (H) 10/26/2024 0857   RBC 3.55 (L) 10/26/2024 0857   HGB 9.5 (L) 10/26/2024 0857   HGB 12.8 (L) 12/19/2017 1532   HCT 31.9 (L) 10/26/2024 9142  HCT 38.0 12/19/2017 1532   PLT 294 10/26/2024 0857   PLT 208 12/19/2017 1532   MCV 89.9 10/26/2024 0857   MCV 99 (H) 12/19/2017 1532   MCH 26.8 10/26/2024 0857   MCHC 29.8 (L) 10/26/2024 0857   RDW 15.8 (H) 10/26/2024 0857   RDW 14.0 12/19/2017 1532   LYMPHSABS 2.2 05/04/2024 1554   LYMPHSABS 1.9 12/19/2017 1532   MONOABS 1.0 05/04/2024 1554   EOSABS 0.5 05/04/2024 1554   EOSABS 0.3 12/19/2017 1532   BASOSABS 0.1 05/04/2024 1554   BASOSABS 0.0 12/19/2017 1532   BMET    Component Value Date/Time   NA 144 10/26/2024 0857   NA 141 01/25/2023 1358   K 3.9 10/26/2024 0857   CL 102 10/26/2024 0857   CO2 32 10/26/2024 0857   GLUCOSE 73 10/26/2024 0857   BUN 30 (H) 10/26/2024 0857   BUN 41 (H)  01/25/2023 1358   CREATININE 1.07 10/26/2024 0857   CREATININE 0.92 03/19/2016 1420   CALCIUM  9.1 10/26/2024 0857   EGFR 56 (L) 01/25/2023 1358   GFRNONAA >60 10/26/2024 0857     Abs eos 12/20/21 -100  HST 07/15/23 showed mild  OSA with AHI 11/ hr with low saturation of 66%     Assessment & Plan:   Discussion: 68 year old male former smoker with COPD, childhood asthma, OSA not on CPAP, DM2, HTN, atrial fib on eliquis , NICM s/p AICD, chronic diastolic heart failure who presents for follow-up for COPD and nocturnal hypoxemia for OSA.   Stable DOE is likely related to related to deconditioning, uncontrolled OSA/nocturnal hypoxemia and COPD. Discussed clinical course and management of COPD/asthma including optimizing bronchodilator regimen and action plan for exacerbation.    Assessment & Plan Nocturnal hypoxemia --Re-ORDER overnight oximetry on room air --Please call us  by the end of January if you have not received/scheduled your test OSA (obstructive sleep apnea) Last sleep test before 2013 Not on CPAP --06/25/23 home sleep study with mild OSA COPD with asthma (HCC) --CONTINUE Wixela 250-50 mcg ONE puff in the morning and evening.  --CONTINUE Spiriva  2.5 mcg TWO puffs in the morning --CONTINUE Albuterol  AS NEEDED for shortness of breath or wheezing --CONTINUE Dupixent per Dermatology. Improves eczema but helps asthma as well --Consider Ohtuvayre  if symptoms persist. Patient declined for now    Health Maintenance Immunization History  Administered Date(s) Administered   Fluad Quad(high Dose 65+) 06/29/2022   Fluad Trivalent(High Dose 65+) 07/01/2023   INFLUENZA, HIGH DOSE SEASONAL PF 06/22/2020, 07/07/2021, 07/30/2024   Influenza Inj Mdck Quad With Preservative 07/22/2019   Influenza Split 07/09/2011, 07/23/2012   Influenza Whole 09/16/2006, 08/26/2007, 07/27/2008, 07/26/2009, 07/07/2010   Influenza,inj,Quad PF,6+ Mos 06/30/2013, 08/19/2014, 06/27/2015, 07/13/2016,  07/09/2017, 07/18/2018   PFIZER(Purple Top)SARS-COV-2 Vaccination 03/21/2020, 04/11/2020, 11/03/2020   Pneumococcal Conjugate-13 04/29/2017   Pneumococcal Polysaccharide-23 03/29/2010, 07/07/2010, 02/23/2015, 03/17/2021   Td 10/16/2003   Tdap 02/23/2015   CT Lung Screen - not qualified. Quit > 15 years ago  Orders Placed This Encounter  Procedures   Pulse oximetry, overnight    On room air    Standing Status:   Future    Expiration Date:   11/04/2025   Meds ordered this encounter  Medications   Tiotropium Bromide  (SPIRIVA  RESPIMAT) 2.5 MCG/ACT AERS    Sig: Take 2 puffs by nebulization daily.    Dispense:  4 g    Refill:  11   fluticasone -salmeterol (WIXELA INHUB) 250-50 MCG/ACT AEPB    Sig: Inhale 1 puff into the lungs  2 (two) times daily. in the morning and at bedtime.    Dispense:  60 each    Refill:  11    Return in about 3 months (around 02/02/2025).  I have spent a total time of 32-minutes on the day of the appointment including chart review, data review, collecting history, coordinating care and discussing medical diagnosis and plan with the patient/family. Past medical history, allergies, medications were reviewed. Pertinent imaging, labs and tests included in this note have been reviewed and interpreted independently by me.   Ashlinn Hemrick Slater Staff, MD [facility] 11/04/2024 4:43 PM     "

## 2024-11-04 NOTE — Assessment & Plan Note (Signed)
 Last sleep test before 2013 Not on CPAP --06/25/23 home sleep study with mild OSA

## 2024-11-04 NOTE — Assessment & Plan Note (Signed)
--  CONTINUE Wixela 250-50 mcg ONE puff in the morning and evening.  --CONTINUE Spiriva  2.5 mcg TWO puffs in the morning --CONTINUE Albuterol  AS NEEDED for shortness of breath or wheezing --CONTINUE Dupixent per Dermatology. Improves eczema but helps asthma as well --Consider Ohtuvayre  if symptoms persist. Patient declined for now

## 2024-11-05 ENCOUNTER — Ambulatory Visit

## 2024-11-05 ENCOUNTER — Ambulatory Visit: Attending: Cardiology

## 2024-11-05 DIAGNOSIS — I428 Other cardiomyopathies: Secondary | ICD-10-CM | POA: Diagnosis not present

## 2024-11-05 LAB — CUP PACEART INCLINIC DEVICE CHECK
Date Time Interrogation Session: 20260122143820
Implantable Lead Connection Status: 753985
Implantable Lead Connection Status: 753985
Implantable Lead Implant Date: 20181205
Implantable Lead Implant Date: 20181205
Implantable Lead Location: 753858
Implantable Lead Location: 753860
Implantable Lead Model: 4398
Implantable Pulse Generator Implant Date: 20260112

## 2024-11-05 NOTE — Patient Instructions (Signed)
   After Your ICD (Implantable Cardiac Defibrillator)    Monitor your defibrillator site for redness, swelling, and drainage. Call the device clinic at [phone] if you experience these symptoms or fever/chills.  Your incision was closed with Steri-strips or staples:  You may shower 7 days after your procedure and wash your incision with soap and water. Avoid lotions, ointments, or perfumes over your incision until it is well-healed.  You may use a hot tub or a pool after your wound check appointment if the incision is completely closed.   Your ICD is designed to protect you from life threatening heart rhythms. Because of this, you may receive a shock.   1 shock with no symptoms:  Call the office during business hours. 1 shock with symptoms (chest pain, chest pressure, dizziness, lightheadedness, shortness of breath, overall feeling unwell):  Call 911. If you experience 2 or more shocks in 24 hours:  Call 911. If you receive a shock, you should not drive.  [place] DMV - no driving for 6 months if you receive appropriate therapy from your ICD.   ICD Alerts:  Some alerts are vibratory and others beep. These are NOT emergencies. Please call our office to let us know. If this occurs at night or on weekends, it can wait until the next business day. Send a remote transmission.  If your device is capable of reading fluid status (for heart failure), you will be offered monthly monitoring to review this with you.   Remote monitoring is used to monitor your ICD from home. This monitoring is scheduled every 91 days by our office. It allows us to keep an eye on the functioning of your device to ensure it is working properly. You will routinely see your Electrophysiologist annually (more often if necessary).  

## 2024-11-05 NOTE — Progress Notes (Signed)
 Normal CRT-D chamber ICD wound check. Wound well healed. Presenting rhythm: BVP. Routine testing performed. Thresholds, sensing, and impedance consistent with implant measurements. No treated arrhythmias. Reviewed shock plan.  Pt enrolled in remote follow-up.-up.

## 2024-11-06 ENCOUNTER — Other Ambulatory Visit: Payer: Self-pay | Admitting: Physical Medicine & Rehabilitation

## 2024-11-09 ENCOUNTER — Encounter (HOSPITAL_BASED_OUTPATIENT_CLINIC_OR_DEPARTMENT_OTHER): Payer: Self-pay | Admitting: Pulmonary Disease

## 2024-11-09 ENCOUNTER — Ambulatory Visit: Admitting: Student

## 2024-11-10 ENCOUNTER — Telehealth: Payer: Self-pay

## 2024-11-10 ENCOUNTER — Encounter: Payer: Self-pay | Admitting: Internal Medicine

## 2024-11-10 ENCOUNTER — Telehealth: Admitting: Internal Medicine

## 2024-11-10 DIAGNOSIS — Z7985 Long-term (current) use of injectable non-insulin antidiabetic drugs: Secondary | ICD-10-CM | POA: Diagnosis not present

## 2024-11-10 DIAGNOSIS — I4819 Other persistent atrial fibrillation: Secondary | ICD-10-CM

## 2024-11-10 DIAGNOSIS — E1165 Type 2 diabetes mellitus with hyperglycemia: Secondary | ICD-10-CM

## 2024-11-10 DIAGNOSIS — I255 Ischemic cardiomyopathy: Secondary | ICD-10-CM

## 2024-11-10 DIAGNOSIS — D649 Anemia, unspecified: Secondary | ICD-10-CM | POA: Diagnosis not present

## 2024-11-10 DIAGNOSIS — J4489 Other specified chronic obstructive pulmonary disease: Secondary | ICD-10-CM | POA: Diagnosis not present

## 2024-11-10 DIAGNOSIS — I428 Other cardiomyopathies: Secondary | ICD-10-CM

## 2024-11-10 MED ORDER — DULOXETINE HCL 60 MG PO CPEP: 60.0000 mg | ORAL_CAPSULE | Freq: Every day | ORAL | 3 refills | Status: AC

## 2024-11-10 MED ORDER — LINACLOTIDE 72 MCG PO CAPS: 72.0000 ug | ORAL_CAPSULE | Freq: Every day | ORAL | 3 refills | Status: AC

## 2024-11-10 MED ORDER — ZOLPIDEM TARTRATE 5 MG PO TABS: 5.0000 mg | ORAL_TABLET | Freq: Every evening | ORAL | 5 refills | Status: AC | PRN

## 2024-11-10 MED ORDER — ALPRAZOLAM 0.5 MG PO TABS: 0.5000 mg | ORAL_TABLET | Freq: Two times a day (BID) | ORAL | 2 refills | Status: AC | PRN

## 2024-11-10 NOTE — Assessment & Plan Note (Signed)
 Stable overall, continue inhaler prn, home o2 2L Vancleave continous

## 2024-11-10 NOTE — Progress Notes (Signed)
 Patient ID: [name], male   DOB: October 26, 1955, 69 y.o.   MRN: [id number]  Virtual Visit via Video Note  I connected with [name] on 11/10/24 at  2:40 PM EST by a video enabled telemedicine application and verified that I am speaking with the correct person using two identifiers.  Location of all participants today Patient: at home Provider: at office   I discussed the limitations of evaluation and management by telemedicine and the availability of in person appointments. The patient expressed understanding and agreed to proceed.  History of Present Illness: Here to f/u, overall doing well, needs multiple med refills.  Pt denies chest pain, increased sob or doe, wheezing, orthopnea, PND, increased LE swelling, palpitations, dizziness or syncope.   Pt denies polydipsia, polyuria, or new focal neuro s/s.   No recent overt bleeding.  Has EGD Colonoscopy scheduled soon with GI.  Has f/u appt with sports medicine feb 10.  Asking to have f/u labs done on that day.  Plans to call himself for eye exam.   Past Medical History:  Diagnosis Date   AICD (automatic cardioverter/defibrillator) present    Anemia    supposed to be taking Vit B but doesn't   ANXIETY    takes Xanax  nightly   Arthritis    Asthma    Albuterol  prn and Advair  daily;also takes Prednisone  daily   Cardiomyopathy [facility])    a. EF 25% TEE July 2013; b. EF normalized 2015;  c. 03/2015 Echo: EF 40-45%, difrf HK, PASP 38 mmHg, Mild MR, sev LAE/RAE.   Chronic constipation    takes OTC stool softener   COPD (chronic obstructive pulmonary disease) (HCC)    one dr says COPD; one dr says emphysema (09/18/2017)   COVID-19 12/09/2019   DEPRESSION    takes Zoloft  and Doxepin  daily   Diverticulitis    DYSKINESIA, ESOPHAGUS    Essential hypertension        FIBROMYALGIA    GERD (gastroesophageal reflux disease)        Glaucoma    HYPERLIPIDEMIA    a. Intolerant to statins.   INSOMNIA    takes Ambien  nightly   Myocardial  infarction [facility])    a. 2012 Myoview  notable for prior infarct;  b. 03/2015 Lexiscan  CL: EF 37%, diff HK, small area of inferior infarct from apex to base-->Med Rx.   O2 dependent    2.5L q hs & prn (09/18/2017)   Paroxysmal atrial fibrillation (HCC)    a. CHA2DS2VASc = 3--> takes Coumadin ;  b. 03/15/2015 Successful TEE/DCCV;  c. 03/2015 recurrent afib, Amio d/c'd in setting of hyperthyroidism.   Peripheral neuropathy    Pneumonia 12/2016   Presence of permanent cardiac pacemaker    Rash and other nonspecific skin eruption 04/12/2009   no cause found saw dermatologists x 2 and allergist   SLEEP APNEA, OBSTRUCTIVE    a. doesn't use CPAP   Syncope    a. 03/2015 s/p MDT LINQ.   Type II diabetes mellitus (HCC)        Past Surgical History:  Procedure Laterality Date   ACNE CYST REMOVAL     2 on back    AMPUTATION TOE Right 06/01/2024   Procedure: AMPUTATION, TOE;  Surgeon: Janit Thresa HERO, DPM;  Location: [facility] OR;  Service: Orthopedics/Podiatry;  Laterality: Right;  RIGHT 2ND TOE IV SEDATION   AV NODE ABLATION N/A 10/25/2017   Procedure: AV NODE ABLATION;  Surgeon: Fernande Elspeth BROCKS, MD;  Location: [facility] INVASIVE CV  LAB;  Service: Cardiovascular;  Laterality: N/A;   BIV ICD GENERATOR CHANGEOUT N/A 10/26/2024   Procedure: BIV ICD GENERATOR CHANGEOUT;  Surgeon: Almetta Donnice LABOR, MD;  Location: [facility] INVASIVE CV LAB;  Service: Cardiovascular;  Laterality: N/A;   BIV ICD INSERTION CRT-D N/A 09/18/2017   Procedure: BIV ICD INSERTION CRT-D;  Surgeon: Fernande Elspeth BROCKS, MD;  Location: [facility] INVASIVE CV LAB;  Service: Cardiovascular;  Laterality: N/A;   CARDIAC CATHETERIZATION N/A 03/21/2016   Procedure: Right/Left Heart Cath and Coronary Angiography;  Surgeon: Ezra GORMAN Shuck, MD;  Location: [facility] INVASIVE CV LAB;  Service: Cardiovascular;  Laterality: N/A;   CARDIOVERSION  04/18/2012   Procedure: CARDIOVERSION;  Surgeon: Vina LULLA Gull, MD;  Location: [facility] OR;  Service: Cardiovascular;  Laterality: N/A;   CARDIOVERSION   04/25/2012   Procedure: CARDIOVERSION;  Surgeon: Aleene JINNY Passe, MD;  Location: [facility] ENDOSCOPY;  Service: Cardiovascular;  Laterality: N/A;   CARDIOVERSION  04/25/2012   Procedure: CARDIOVERSION;  Surgeon: Vina LULLA Gull, MD;  Location: [facility] OR;  Service: Cardiovascular;  Laterality: N/A;   CARDIOVERSION  05/09/2012   Procedure: CARDIOVERSION;  Surgeon: Ozell Fell, MD;  Location: [facility] OR;  Service: Cardiovascular;  Laterality: N/A;  changed from crenshaw to cooper by trish/leone-endo   CARDIOVERSION N/A 03/15/2015   Procedure: CARDIOVERSION;  Surgeon: Aleene JINNY Passe, MD;  Location: [facility] ENDOSCOPY;  Service: Cardiovascular;  Laterality: N/A;   COLONOSCOPY     COLONOSCOPY WITH PROPOFOL  N/A 10/21/2014   Procedure: COLONOSCOPY WITH PROPOFOL ;  Surgeon: Gwendlyn ONEIDA Buddy, MD;  Location: [facility] ENDOSCOPY;  Service: Endoscopy;  Laterality: N/A;   EP IMPLANTABLE DEVICE N/A 04/06/2015   Procedure: Loop Recorder Insertion;  Surgeon: Danelle LELON Birmingham, MD;  Location: [facility] INVASIVE CV LAB;  Service: Cardiovascular;  Laterality: N/A;   ESOPHAGOGASTRODUODENOSCOPY     JOINT REPLACEMENT     LOOP RECORDER REMOVAL N/A 09/18/2017   Procedure: LOOP RECORDER REMOVAL;  Surgeon: Fernande Elspeth BROCKS, MD;  Location: [facility] INVASIVE CV LAB;  Service: Cardiovascular;  Laterality: N/A;   RIGHT/LEFT HEART CATH AND CORONARY ANGIOGRAPHY N/A 01/28/2017   Procedure: Right/Left Heart Cath and Coronary Angiography;  Surgeon: Ezra GORMAN Shuck, MD;  Location: [facility] INVASIVE CV LAB;  Service: Cardiovascular;  Laterality: N/A;   TEE WITHOUT CARDIOVERSION  04/25/2012   Procedure: TRANSESOPHAGEAL ECHOCARDIOGRAM (TEE);  Surgeon: Aleene JINNY Passe, MD;  Location: [facility] ENDOSCOPY;  Service: Cardiovascular;  Laterality: N/A;   TEE WITHOUT CARDIOVERSION N/A 03/15/2015   Procedure: TRANSESOPHAGEAL ECHOCARDIOGRAM (TEE);  Surgeon: Aleene JINNY Passe, MD;  Location: [facility] ENDOSCOPY;  Service: Cardiovascular;  Laterality: N/A;   TONSILLECTOMY AND ADENOIDECTOMY     TOTAL KNEE ARTHROPLASTY  Right 06/15/2014   Procedure: TOTAL KNEE ARTHROPLASTY;  Surgeon: Evalene JONETTA Chancy, MD;  Location: [facility] OR;  Service: Orthopedics;  Laterality: Right;   TOTAL KNEE ARTHROPLASTY Left 10/17/2021   Procedure: Left TOTAL KNEE ARTHROPLASTY;  Surgeon: Vernetta Lonni GRADE, MD;  Location: [facility] OR;  Service: Orthopedics;  Laterality: Left;    reports that he quit smoking about 17 years ago. His smoking use included cigarettes. He started smoking about 47 years ago. He has a 60 pack-year smoking history. He has never used smokeless tobacco. He reports that he does not drink alcohol and does not use drugs. family history includes Allergies in his mother; Asthma in his maternal grandmother and mother; COPD in his mother; Colon polyps in his mother; Hypothyroidism in his mother. Allergies[1] Medications Ordered Prior to Encounter[2]   Observations/Objective: Alert, NAD, appropriate mood and affect,  resps normal, cn 2-12 intact, moves all 4s, no visible rash or swelling Lab Results  Component Value Date   WBC 11.1 (H) 10/26/2024   HGB 9.5 (L) 10/26/2024   HCT 31.9 (L) 10/26/2024   PLT 294 10/26/2024   GLUCOSE 73 10/26/2024   CHOL 130 01/07/2024   TRIG 139.0 01/07/2024   HDL 52.40 01/07/2024   LDLDIRECT 72.0 02/28/2021   LDLCALC 49 01/07/2024   ALT 18 05/04/2024   AST 27 05/04/2024   NA 144 10/26/2024   K 3.9 10/26/2024   CL 102 10/26/2024   CREATININE 1.07 10/26/2024   BUN 30 (H) 10/26/2024   CO2 32 10/26/2024   TSH 2.02 01/07/2024   PSA 3.46 01/07/2024   INR 1.3 (H) 05/04/2024   HGBA1C 6.0 01/07/2024   MICROALBUR <0.7 01/07/2024   Assessment and Plan: See notes  Follow Up Instructions: See notes   I discussed the assessment and treatment plan with the patient. The patient was provided an opportunity to ask questions and all were answered. The patient agreed with the plan and demonstrated an understanding of the instructions.   The patient was advised to call back or seek an in-person  evaluation if the symptoms worsen or if the condition fails to improve as anticipated.  Lynwood Rush, MD     [1]  Allergies Allergen Reactions   Amiodarone  Other (See Comments)    Caused hyperthyroidism    Statins Other (See Comments)    Myalgias    Sulfa  Antibiotics Other (See Comments)    Mouth ulcers   Wound Dressing Adhesive Other (See Comments)    Skin Tears Use Paper Tape Only  [2]  Current Outpatient Medications on File Prior to Visit  Medication Sig Dispense Refill   acetaminophen  (TYLENOL ) 325 MG tablet Take 2 tablets (650 mg total) by mouth every 6 (six) hours as needed for fever or mild pain (pain score 1-3).     albuterol  (VENTOLIN  HFA) 108 (90 Base) MCG/ACT inhaler INHALE 1-2 PUFFS BY MOUTH EVERY 6 HOURS AS NEEDED FOR WHEEZE OR SHORTNESS OF BREATH 8.5 each 5   apixaban  (ELIQUIS ) 5 MG TABS tablet Take 1 tablet (5 mg total) by mouth 2 (two) times daily. 60 tablet 11   ascorbic acid (VITAMIN C) 500 MG tablet Take 500 mg by mouth daily.     augmented betamethasone  dipropionate (DIPROLENE -AF) 0.05 % cream Apply 1 application  topically daily as needed (to affected areas- for itching).     carvedilol  (COREG ) 3.125 MG tablet Take 1 tablet (3.125 mg total) by mouth 2 (two) times daily. PLEASE SCHEDULE APPOINTMENT FOR MORE REFILLS [phone] OPTION 2 180 tablet 0   Cholecalciferol  (VITAMIN D3) 50 MCG (2000 UT) TABS Take 2,000 Units by mouth daily.     Cyanocobalamin  (VITAMIN B-12) 2500 MCG SUBL Place 2,500 mcg under the tongue 2 (two) times a week.     dicyclomine  (BENTYL ) 10 MG capsule Take 1 capsule (10 mg total) by mouth 4 (four) times daily -  before meals and at bedtime. 90 capsule 2   doxepin  (SINEQUAN ) 10 MG capsule TAKE 2 CAPSULES BY MOUTH AT BEDTIME. 180 capsule 1   Dupilumab (DUPIXENT) 300 MG/2ML SOPN Inject 300 mg into the skin every 14 (fourteen) days.     eplerenone  (INSPRA ) 25 MG tablet Take 25 mg by mouth daily.     esomeprazole  (NEXIUM ) 40 MG capsule Take 1  capsule (40 mg total) by mouth daily at 12 noon. 30 capsule 2   Evolocumab  (REPATHA   SURECLICK) 140 MG/ML SOAJ INJECT 140 MG INTO THE SKIN EVERY 14 DAYS 6 mL 3   fluticasone  (FLONASE ) 50 MCG/ACT nasal spray Place 2 sprays into both nostrils daily as needed for allergies or rhinitis. 48 mL 1   fluticasone -salmeterol (WIXELA INHUB) 250-50 MCG/ACT AEPB Inhale 1 puff into the lungs 2 (two) times daily. in the morning and at bedtime. 60 each 11   gabapentin  (NEURONTIN ) 100 MG capsule TAKE 1 CAPSULE (100 MG TOTAL) BY MOUTH THREE TIMES DAILY. 90 capsule 3   hydrOXYzine  (VISTARIL ) 100 MG capsule Take 1 capsule (100 mg total) by mouth 3 (three) times daily as needed for itching. 90 capsule 2   ibuprofen  (ADVIL ) 200 MG tablet Take 400-800 mg by mouth every 8 (eight) hours as needed for moderate pain (pain score 4-6).     ipratropium-albuterol  (DUONEB) 0.5-2.5 (3) MG/3ML SOLN TAKE 3 ML BY NEBULIZATION EVERY 4 HOURS AS NEEDED (WHEEZING). 360 mL 0   lidocaine  (LIDODERM ) 5 % PLACE 1 PATCH ONTO THE SKIN DAILY. REMOVE & DISCARD PATCH WITHIN 12 HOURS OR AS DIRECTED BY MD 30 patch 2   losartan  (COZAAR ) 25 MG tablet Take 25 mg by mouth daily.     magnesium  hydroxide (MILK OF MAGNESIA) 400 MG/5ML suspension Take 5 mLs by mouth daily as needed for mild constipation. 355 mL 0   meclizine  (ANTIVERT ) 25 MG tablet Take 1 tablet (25 mg total) by mouth 3 (three) times daily as needed for dizziness. 30 tablet 0   Multiple Vitamins-Minerals (SENIOR VITES PO) Take 1 tablet by mouth daily with breakfast.     omeprazole  (PRILOSEC) 20 MG capsule TAKE 1 CAPSULE BY MOUTH TWICE A DAY 180 capsule 1   ondansetron  (ZOFRAN -ODT) 4 MG disintegrating tablet Take 1 tablet (4 mg total) by mouth every 8 (eight) hours as needed. 30 tablet 1   oxyCODONE -acetaminophen  (PERCOCET) 5-325 MG tablet Take 1 tablet by mouth every 6 (six) hours as needed for severe pain (pain score 7-10). 20 tablet 0   predniSONE  (DELTASONE ) 10 MG tablet Take 1 tablet (10  mg total) by mouth daily with breakfast. 90 tablet 0   tamsulosin  (FLOMAX ) 0.4 MG CAPS capsule Take 1 capsule (0.4 mg total) by mouth daily. 30 capsule 5   Tiotropium Bromide  (SPIRIVA  RESPIMAT) 2.5 MCG/ACT AERS Take 2 puffs by nebulization daily. 4 g 11   tirzepatide  (MOUNJARO ) 10 MG/0.5ML Pen Inject 10 mg into the skin once a week. 6 mL 3   tiZANidine  (ZANAFLEX ) 4 MG tablet TAKE 1 TABLET BY MOUTH 3 TIMES DAILY. 270 tablet 2   torsemide  (DEMADEX ) 20 MG tablet Take 4 tablets (80 mg total) by mouth 2 (two) times daily. (Patient taking differently: Take 80 mg by mouth daily.) 720 tablet 2   traZODone  (DESYREL ) 100 MG tablet TAKE 2 TABLETS BY MOUTH AT BEDTIME AS NEEDED 180 tablet 1   TUMS 500 MG chewable tablet Chew 1 tablet by mouth 2 (two) times daily as needed for indigestion or heartburn.     valACYclovir  (VALTREX ) 1000 MG tablet Take 1,000 mg by mouth 3 (three) times daily as needed (Outbreaks).     venlafaxine  XR (EFFEXOR -XR) 150 MG 24 hr capsule TAKE 1 CAPSULE BY MOUTH DAILY WITH BREAKFAST. 90 capsule 3   No current facility-administered medications on file prior to visit.

## 2024-11-10 NOTE — Assessment & Plan Note (Addendum)
 Lab Results  Component Value Date   HGBA1C 6.0 01/07/2024   Stable, pt to continue current medical treatment mounjaro  10 mg every day, for f/u A1c with next labs With hyperglycemia

## 2024-11-10 NOTE — Assessment & Plan Note (Signed)
 No overt bleeding, for f/u cbc iron with labs feb 10 per pt request

## 2024-11-10 NOTE — Patient Instructions (Signed)
 Please take all medication as prescribed  Please go to the LAB at the blood drawing area for the tests to be done on Feb 10 as you mentioned

## 2024-11-10 NOTE — Telephone Encounter (Signed)
 Patient called requesting a refill on Carisoprodol . He states he takes 3 Tizanidine  during the day and 1 Carisoprodol  at bedtime.

## 2024-11-11 ENCOUNTER — Telehealth (HOSPITAL_BASED_OUTPATIENT_CLINIC_OR_DEPARTMENT_OTHER): Payer: Self-pay

## 2024-11-11 NOTE — Telephone Encounter (Signed)
 This patient was last seen a year ago. He refused therapy and has been non-compliant with recs at times. We decided to part ways after his last visit. Please see note for details

## 2024-11-11 NOTE — Telephone Encounter (Signed)
 Sent message to [employer] about this order

## 2024-11-11 NOTE — Telephone Encounter (Signed)
 Copied from CRM #[id number]. Topic: Clinical - Request for Lab/Test Order >> Nov 11, 2024  2:39 PM Benton O wrote: Reason for CRM: patient is calliung because the last time he seen dr kassie they talked about the pulse oximetry and patient didn't know if he was soppused to order or if she suppose to order . Relayed to patient that I do see order was placed but patient still has not received test . Please give patient a call about this test  [phone]

## 2024-11-12 NOTE — Progress Notes (Signed)
 31 day ICM Remote transmission canceled due to [facility] clinic is on hold until further notice.  91 day remote monitoring will continue per protocol.

## 2024-11-15 ENCOUNTER — Ambulatory Visit: Payer: Self-pay | Admitting: Student in an Organized Health Care Education/Training Program

## 2024-11-17 ENCOUNTER — Telehealth (HOSPITAL_BASED_OUTPATIENT_CLINIC_OR_DEPARTMENT_OTHER): Payer: Self-pay

## 2024-11-17 NOTE — Telephone Encounter (Unsigned)
 Copied from CRM [id number]. Topic: Clinical - Order For Equipment >> Nov 17, 2024  5:02 PM Dedra B wrote: Reason for CRM: [employer] sent patient a pulse oximeter. He sent it back, and they are saying they didn't receive it. They want to charge him $1100 and will not send the one he needs. He is asking for a nurse or Dr. Kassie to contact them on his behalf. He is also requesting a call from the nurse.

## 2024-11-18 ENCOUNTER — Telehealth: Payer: Self-pay | Admitting: *Deleted

## 2024-11-18 ENCOUNTER — Telehealth: Payer: Self-pay

## 2024-11-18 NOTE — Telephone Encounter (Signed)
 Copied from CRM [id number]. Topic: Clinical - Medication Question >> Nov 18, 2024  2:18 PM Paige D wrote: Reason for CRM: Pt is calling in regards to a couple of his medications he needs refills on he is wanting Dr. Naomi nurse to give him a call back please.

## 2024-11-18 NOTE — Telephone Encounter (Signed)
 Sent to [employer]

## 2024-11-18 NOTE — Telephone Encounter (Signed)
 Mr Morimoto is requesting a refill on his soma .

## 2024-11-19 ENCOUNTER — Telehealth (HOSPITAL_COMMUNITY): Payer: Self-pay

## 2024-11-19 MED ORDER — CARISOPRODOL 350 MG PO TABS: 350.0000 mg | ORAL_TABLET | Freq: Three times a day (TID) | ORAL | 1 refills | Status: AC

## 2024-11-19 NOTE — Telephone Encounter (Signed)
 Ok this refill is done thanks

## 2024-11-19 NOTE — Telephone Encounter (Signed)
 Advanced Heart Failure Patient Advocate Encounter  This patient was approved for a [employer] grant that will help cover the cost of Repatha .  Total amount awarded, $2,500.  Effective: 10/20/2024 - 10/19/2025.  BIN [id number] PCN [id number] Group [id number] ID [id number]  Pharmacy provided with approval and processing information. Confirmed $0 copay for Repatha . Patient informed via phone.  Rachel DEL, CPhT Rx Patient Advocate Phone: [phone]

## 2024-11-19 NOTE — Addendum Note (Signed)
 Addended by: NORLEEN LYNWOOD ORN on: 11/19/2024 11:52 AM   Modules accepted: Orders

## 2024-11-19 NOTE — Telephone Encounter (Signed)
 Pt is requesting fro Carisoprodol  350mg  tablets to be sent to his pharmacy, this medication is not listed on patient med list but Pt states he has been on this medication for years.

## 2024-11-20 ENCOUNTER — Other Ambulatory Visit (HOSPITAL_COMMUNITY): Payer: Self-pay | Admitting: Cardiology

## 2024-11-24 ENCOUNTER — Ambulatory Visit: Admitting: Family Medicine

## 2024-11-26 ENCOUNTER — Ambulatory Visit: Admitting: Physician Assistant

## 2024-11-30 ENCOUNTER — Ambulatory Visit

## 2024-12-06 ENCOUNTER — Ambulatory Visit

## 2024-12-07 ENCOUNTER — Ambulatory Visit

## 2025-01-06 ENCOUNTER — Ambulatory Visit

## 2025-01-07 ENCOUNTER — Ambulatory Visit

## 2025-01-20 ENCOUNTER — Encounter

## 2025-01-25 ENCOUNTER — Ambulatory Visit: Admitting: Physician Assistant

## 2025-01-26 ENCOUNTER — Encounter: Admitting: Family Medicine

## 2025-02-02 ENCOUNTER — Ambulatory Visit (HOSPITAL_BASED_OUTPATIENT_CLINIC_OR_DEPARTMENT_OTHER): Admitting: Pulmonary Disease

## 2025-02-02 ENCOUNTER — Encounter

## 2025-03-08 ENCOUNTER — Ambulatory Visit

## 2025-04-21 ENCOUNTER — Encounter

## 2025-05-04 ENCOUNTER — Encounter

## 2025-06-07 ENCOUNTER — Ambulatory Visit

## 2025-07-21 ENCOUNTER — Encounter

## 2025-08-03 ENCOUNTER — Encounter

## 2025-09-06 ENCOUNTER — Ambulatory Visit

## 2025-10-20 ENCOUNTER — Encounter

## 2025-11-02 ENCOUNTER — Encounter

## 2025-12-06 ENCOUNTER — Ambulatory Visit

## 2026-01-19 ENCOUNTER — Encounter

## 2026-02-01 ENCOUNTER — Encounter

## 2026-04-20 ENCOUNTER — Encounter
